# Patient Record
Sex: Male | Born: 1955
Health system: Southern US, Community
[De-identification: ages and names within clinical notes are randomized; demographics above are authoritative.]

## PROBLEM LIST (undated history)

## (undated) DIAGNOSIS — I4891 Unspecified atrial fibrillation: Secondary | ICD-10-CM

## (undated) DIAGNOSIS — R1900 Intra-abdominal and pelvic swelling, mass and lump, unspecified site: Secondary | ICD-10-CM

## (undated) DIAGNOSIS — N183 Chronic kidney disease, stage 3 unspecified: Secondary | ICD-10-CM

## (undated) DIAGNOSIS — Z5189 Encounter for other specified aftercare: Secondary | ICD-10-CM

## (undated) DIAGNOSIS — J189 Pneumonia, unspecified organism: Secondary | ICD-10-CM

## (undated) DIAGNOSIS — M7989 Other specified soft tissue disorders: Secondary | ICD-10-CM

## (undated) DIAGNOSIS — I340 Nonrheumatic mitral (valve) insufficiency: Secondary | ICD-10-CM

## (undated) DIAGNOSIS — D571 Sickle-cell disease without crisis: Secondary | ICD-10-CM

## (undated) DIAGNOSIS — I499 Cardiac arrhythmia, unspecified: Secondary | ICD-10-CM

## (undated) DIAGNOSIS — IMO0001 Reserved for inherently not codable concepts without codable children: Secondary | ICD-10-CM

## (undated) DIAGNOSIS — R6881 Early satiety: Secondary | ICD-10-CM

## (undated) DIAGNOSIS — I071 Rheumatic tricuspid insufficiency: Secondary | ICD-10-CM

## (undated) DIAGNOSIS — M109 Gout, unspecified: Secondary | ICD-10-CM

## (undated) DIAGNOSIS — I272 Pulmonary hypertension, unspecified: Secondary | ICD-10-CM

## (undated) DIAGNOSIS — I509 Heart failure, unspecified: Secondary | ICD-10-CM

## (undated) DIAGNOSIS — K219 Gastro-esophageal reflux disease without esophagitis: Secondary | ICD-10-CM

## (undated) DIAGNOSIS — H548 Legal blindness, as defined in USA: Secondary | ICD-10-CM

## (undated) DIAGNOSIS — N189 Chronic kidney disease, unspecified: Secondary | ICD-10-CM

## (undated) DIAGNOSIS — R51 Headache: Secondary | ICD-10-CM

## (undated) DIAGNOSIS — D759 Disease of blood and blood-forming organs, unspecified: Secondary | ICD-10-CM

## (undated) HISTORY — DX: Chronic kidney disease, unspecified: N18.9

---

## 1997-11-14 ENCOUNTER — Inpatient Hospital Stay (HOSPITAL_COMMUNITY): Admission: EM | Admit: 1997-11-14 | Discharge: 1997-11-17 | Payer: Self-pay | Admitting: Emergency Medicine

## 1998-01-22 ENCOUNTER — Emergency Department (HOSPITAL_COMMUNITY): Admission: EM | Admit: 1998-01-22 | Discharge: 1998-01-22 | Payer: Self-pay | Admitting: Emergency Medicine

## 1999-05-05 ENCOUNTER — Ambulatory Visit (HOSPITAL_COMMUNITY): Admission: RE | Admit: 1999-05-05 | Discharge: 1999-05-05 | Payer: Self-pay | Admitting: Family Medicine

## 1999-05-05 ENCOUNTER — Encounter: Payer: Self-pay | Admitting: Family Medicine

## 1999-05-22 ENCOUNTER — Emergency Department (HOSPITAL_COMMUNITY): Admission: EM | Admit: 1999-05-22 | Discharge: 1999-05-22 | Payer: Self-pay

## 1999-05-25 ENCOUNTER — Encounter: Payer: Self-pay | Admitting: Family Medicine

## 1999-05-25 ENCOUNTER — Ambulatory Visit (HOSPITAL_COMMUNITY): Admission: RE | Admit: 1999-05-25 | Discharge: 1999-05-25 | Payer: Self-pay | Admitting: Family Medicine

## 1999-05-29 ENCOUNTER — Encounter: Admission: RE | Admit: 1999-05-29 | Discharge: 1999-08-27 | Payer: Self-pay | Admitting: Family Medicine

## 2001-03-21 ENCOUNTER — Encounter: Payer: Self-pay | Admitting: Family Medicine

## 2001-03-21 ENCOUNTER — Encounter: Admission: RE | Admit: 2001-03-21 | Discharge: 2001-03-21 | Payer: Self-pay | Admitting: *Deleted

## 2002-04-24 ENCOUNTER — Encounter: Admission: RE | Admit: 2002-04-24 | Discharge: 2002-04-24 | Payer: Self-pay | Admitting: Family Medicine

## 2002-04-24 ENCOUNTER — Encounter: Payer: Self-pay | Admitting: Family Medicine

## 2002-11-17 ENCOUNTER — Encounter: Payer: Self-pay | Admitting: Family Medicine

## 2002-11-17 ENCOUNTER — Inpatient Hospital Stay (HOSPITAL_COMMUNITY): Admission: EM | Admit: 2002-11-17 | Discharge: 2002-11-29 | Payer: Self-pay

## 2002-11-18 ENCOUNTER — Encounter: Payer: Self-pay | Admitting: Family Medicine

## 2002-11-20 ENCOUNTER — Encounter: Payer: Self-pay | Admitting: Family Medicine

## 2002-11-24 ENCOUNTER — Encounter: Payer: Self-pay | Admitting: Family Medicine

## 2003-08-11 ENCOUNTER — Encounter (HOSPITAL_COMMUNITY): Admission: RE | Admit: 2003-08-11 | Discharge: 2003-08-11 | Payer: Self-pay | Admitting: Family Medicine

## 2006-07-05 ENCOUNTER — Emergency Department (HOSPITAL_COMMUNITY): Admission: EM | Admit: 2006-07-05 | Discharge: 2006-07-05 | Payer: Self-pay | Admitting: Emergency Medicine

## 2010-07-05 ENCOUNTER — Emergency Department (HOSPITAL_COMMUNITY)
Admission: EM | Admit: 2010-07-05 | Discharge: 2010-07-05 | Payer: Self-pay | Source: Home / Self Care | Admitting: Emergency Medicine

## 2010-07-06 ENCOUNTER — Ambulatory Visit (HOSPITAL_COMMUNITY)
Admission: RE | Admit: 2010-07-06 | Discharge: 2010-07-06 | Payer: Self-pay | Source: Home / Self Care | Attending: Emergency Medicine | Admitting: Emergency Medicine

## 2010-07-06 ENCOUNTER — Encounter (INDEPENDENT_AMBULATORY_CARE_PROVIDER_SITE_OTHER): Payer: Self-pay | Admitting: Emergency Medicine

## 2010-07-10 LAB — DIFFERENTIAL
Basophils Absolute: 0.3 10*3/uL — ABNORMAL HIGH (ref 0.0–0.1)
Basophils Relative: 2 % — ABNORMAL HIGH (ref 0–1)
Eosinophils Absolute: 0.5 10*3/uL (ref 0.0–0.7)
Eosinophils Relative: 3 % (ref 0–5)
Lymphocytes Relative: 19 % (ref 12–46)
Lymphs Abs: 2.9 10*3/uL (ref 0.7–4.0)
Monocytes Absolute: 2.5 10*3/uL — ABNORMAL HIGH (ref 0.1–1.0)
Monocytes Relative: 16 % — ABNORMAL HIGH (ref 3–12)
Neutro Abs: 9.2 10*3/uL — ABNORMAL HIGH (ref 1.7–7.7)
Neutrophils Relative %: 60 % (ref 43–77)

## 2010-07-10 LAB — URINALYSIS, ROUTINE W REFLEX MICROSCOPIC
Bilirubin Urine: NEGATIVE
Ketones, ur: NEGATIVE mg/dL
Leukocytes, UA: NEGATIVE
Nitrite: NEGATIVE
Protein, ur: >300 mg/dL — AB
Specific Gravity, Urine: 1.01 (ref 1.005–1.030)
Urine Glucose, Fasting: NEGATIVE mg/dL
Urobilinogen, UA: 2 mg/dL — ABNORMAL HIGH (ref 0.0–1.0)
pH: 6.5 (ref 5.0–8.0)

## 2010-07-10 LAB — CBC
HCT: 20.8 % — ABNORMAL LOW (ref 39.0–52.0)
Hemoglobin: 7.6 g/dL — ABNORMAL LOW (ref 13.0–17.0)
MCH: 31.8 pg (ref 26.0–34.0)
MCHC: 36.5 g/dL — ABNORMAL HIGH (ref 30.0–36.0)
MCV: 87 fL (ref 78.0–100.0)
Platelets: 258 10*3/uL (ref 150–400)
RBC: 2.39 MIL/uL — ABNORMAL LOW (ref 4.22–5.81)
RDW: 26.9 % — ABNORMAL HIGH (ref 11.5–15.5)
WBC: 15.4 10*3/uL — ABNORMAL HIGH (ref 4.0–10.5)

## 2010-07-10 LAB — COMPREHENSIVE METABOLIC PANEL
ALT: 24 U/L (ref 0–53)
AST: 72 U/L — ABNORMAL HIGH (ref 0–37)
Albumin: 2.9 g/dL — ABNORMAL LOW (ref 3.5–5.2)
Alkaline Phosphatase: 85 U/L (ref 39–117)
BUN: 17 mg/dL (ref 6–23)
CO2: 22 mEq/L (ref 19–32)
Calcium: 8.4 mg/dL (ref 8.4–10.5)
Chloride: 107 mEq/L (ref 96–112)
Creatinine, Ser: 1.31 mg/dL (ref 0.4–1.5)
GFR calc Af Amer: >60 mL/min (ref >60)
GFR calc non Af Amer: 57 mL/min — ABNORMAL LOW (ref >60)
Glucose, Bld: 103 mg/dL — ABNORMAL HIGH (ref 70–99)
Potassium: 5.1 mEq/L (ref 3.5–5.1)
Sodium: 137 mEq/L (ref 135–145)
Total Bilirubin: 2.8 mg/dL — ABNORMAL HIGH (ref 0.3–1.2)
Total Protein: 6.4 g/dL (ref 6.0–8.3)

## 2010-07-10 LAB — URINE MICROSCOPIC-ADD ON

## 2010-07-10 LAB — RETICULOCYTES
RBC.: 2.25 MIL/uL — ABNORMAL LOW (ref 4.22–5.81)
Retic Count, Absolute: 472.5 10*3/uL — ABNORMAL HIGH (ref 19.0–186.0)
Retic Ct Pct: 21 % — ABNORMAL HIGH (ref 0.4–3.1)

## 2010-11-03 NOTE — Discharge Summary (Signed)
NAMESCOT, ENSOR                       ACCOUNT NO.:  [id number]   MEDICAL RECORD NO.:  [id number]                   PATIENT TYPE:  INP   LOCATION:  5736                                 FACILITY:  [facility]   PHYSICIAN:  Lucianne Lei, M.D.                   DATE OF BIRTH:  [birth year]   DATE OF ADMISSION:  11/17/2002  DATE OF DISCHARGE:  11/29/2002                                 DISCHARGE SUMMARY   HISTORY OF PRESENT ILLNESS:  The patient is a 55 year old male who presented  to the hospital with complaints of pain.  He is known to have sickle cell  disease.  He was admitted to the hospital and given pain medication at the  time.  He continued to have problems with his aeration.  He felt hypoxic.  This patient initially also had a white count of 20.4, hemoglobin of 9.1,  and 25.2.  Reticulocyte count 11.5.  He was placed in the hospital.  He was  started on Tequin, antibiotics.  Hemoglobin dropped down to 7.9 and a white  count of 14.3.  Hematocrit of 22.2.   Because of the decreased problem of his hypoxia, he had a chest spiral CT  done which was negative for any pulmonary emboli being present.  He  continued to have pain and was with O2 saturations down to 81% on five  liters.  It was felt that he probably had acute chest syndrome.  Because of  the periods of prolonged hypoxemia, we did go ahead and get a pulmonary  consultation and also transferred him to the ICU for a short period of time.  Pulmonary concurred with what we were doing and wrote for better pulmonary  toiletry to be done on this patient.  After about three days in the ICU, we  started to see a distinct improvement in his care.   Note that during the period, we did see decrease in his platelet count to  113,000 and that will be monitored on an outpatient.  The patient then  slowly started to show improvement.  He became afebrile.  His O2 saturations  started to improve but he still required O2 at low levels in  order to keep  this level up.  Noticeably, the patient started to diurese and we did see an  improvement.  He was transfused fluids and blood and this also I think  helped with the situation.   He has continued to have low grade temperatures for the next few days and  this too regressed.  We slowly started then decreasing his O2 requirements  until we had him off his O2 in 24 hours and noted that we did not have  desaturations occurring.  At that time, we thought it was fine for this  patient to be sent home.   The patient's pain needs also decreased and he had infiltration of IV.  We  felt that he was fine enough that he could stop his IV pain medications and  he was started on p.o. pain medications.  This appeared to be adequate.  The  real problem, though, was the O2 saturation problems which did take some  time for [place] to correct. The patient is improved and will be sent home in  improved condition with the diagnosis of sickle cell crisis with acute chest  syndrome.   DISCHARGE INSTRUCTIONS:  He will be seen in the office in a one-week period  of time.  He is to continue his Avalox.  He will be instructed in using an  albuterol MDI which he will use on an outpatient basis and he will be going  home on Dilaudid for pain.  Note, that we will repeat his liver enzymes on  an outpatient basis to make sure that they are coming down.  During  dialysis, there was an elevation.                                               Lucianne Lei, M.D.    VB/MEDQ  D:  11/29/2002  T:  11/29/2002  Job:  [id number]

## 2011-05-19 HISTORY — PX: CARDIAC CATHETERIZATION: SHX172

## 2011-06-07 ENCOUNTER — Inpatient Hospital Stay (HOSPITAL_COMMUNITY): Payer: PRIVATE HEALTH INSURANCE

## 2011-06-07 ENCOUNTER — Encounter (HOSPITAL_COMMUNITY): Payer: Self-pay | Admitting: *Deleted

## 2011-06-07 ENCOUNTER — Inpatient Hospital Stay (HOSPITAL_COMMUNITY)
Admission: AD | Admit: 2011-06-07 | Discharge: 2011-06-12 | DRG: 287 | Disposition: A | Discharge status: Home / Self Care | Payer: PRIVATE HEALTH INSURANCE | Source: Ambulatory Visit | Attending: Internal Medicine | Admitting: Internal Medicine

## 2011-06-07 ENCOUNTER — Other Ambulatory Visit: Payer: Self-pay

## 2011-06-07 DIAGNOSIS — K7460 Unspecified cirrhosis of liver: Secondary | ICD-10-CM | POA: Diagnosis present

## 2011-06-07 DIAGNOSIS — D571 Sickle-cell disease without crisis: Secondary | ICD-10-CM | POA: Diagnosis present

## 2011-06-07 DIAGNOSIS — I129 Hypertensive chronic kidney disease with stage 1 through stage 4 chronic kidney disease, or unspecified chronic kidney disease: Secondary | ICD-10-CM | POA: Diagnosis present

## 2011-06-07 DIAGNOSIS — F17200 Nicotine dependence, unspecified, uncomplicated: Secondary | ICD-10-CM | POA: Diagnosis present

## 2011-06-07 DIAGNOSIS — N183 Chronic kidney disease, stage 3 unspecified: Secondary | ICD-10-CM | POA: Diagnosis present

## 2011-06-07 DIAGNOSIS — E8770 Fluid overload, unspecified: Secondary | ICD-10-CM | POA: Diagnosis present

## 2011-06-07 DIAGNOSIS — D689 Coagulation defect, unspecified: Secondary | ICD-10-CM | POA: Diagnosis present

## 2011-06-07 DIAGNOSIS — N179 Acute kidney failure, unspecified: Secondary | ICD-10-CM | POA: Diagnosis present

## 2011-06-07 DIAGNOSIS — J984 Other disorders of lung: Secondary | ICD-10-CM | POA: Diagnosis present

## 2011-06-07 DIAGNOSIS — R601 Generalized edema: Secondary | ICD-10-CM | POA: Diagnosis present

## 2011-06-07 DIAGNOSIS — D638 Anemia in other chronic diseases classified elsewhere: Secondary | ICD-10-CM | POA: Diagnosis present

## 2011-06-07 DIAGNOSIS — I1310 Hypertensive heart and chronic kidney disease without heart failure, with stage 1 through stage 4 chronic kidney disease, or unspecified chronic kidney disease: Secondary | ICD-10-CM | POA: Diagnosis present

## 2011-06-07 DIAGNOSIS — D696 Thrombocytopenia, unspecified: Secondary | ICD-10-CM | POA: Diagnosis present

## 2011-06-07 DIAGNOSIS — I5021 Acute systolic (congestive) heart failure: Principal | ICD-10-CM | POA: Diagnosis present

## 2011-06-07 DIAGNOSIS — I10 Essential (primary) hypertension: Secondary | ICD-10-CM | POA: Diagnosis present

## 2011-06-07 HISTORY — DX: Intra-abdominal and pelvic swelling, mass and lump, unspecified site: R19.00

## 2011-06-07 HISTORY — DX: Other specified soft tissue disorders: M79.89

## 2011-06-07 HISTORY — DX: Headache: R51

## 2011-06-07 LAB — URINALYSIS, ROUTINE W REFLEX MICROSCOPIC
Bilirubin Urine: NEGATIVE
Glucose, UA: NEGATIVE mg/dL
Ketones, ur: NEGATIVE mg/dL
Leukocytes, UA: NEGATIVE
Nitrite: NEGATIVE
Protein, ur: >300 mg/dL — AB
Specific Gravity, Urine: 1.01 (ref 1.005–1.030)
Urobilinogen, UA: 1 mg/dL (ref 0.0–1.0)
pH: 6 (ref 5.0–8.0)

## 2011-06-07 LAB — DIFFERENTIAL
Basophils Absolute: 0.1 10*3/uL (ref 0.0–0.1)
Basophils Relative: 1 % (ref 0–1)
Eosinophils Absolute: 0.1 10*3/uL (ref 0.0–0.7)
Eosinophils Relative: 1 % (ref 0–5)
Lymphocytes Relative: 8 % — ABNORMAL LOW (ref 12–46)
Lymphs Abs: 0.9 10*3/uL (ref 0.7–4.0)
Monocytes Absolute: 1.4 10*3/uL — ABNORMAL HIGH (ref 0.1–1.0)
Monocytes Relative: 13 % — ABNORMAL HIGH (ref 3–12)
Neutro Abs: 8.6 10*3/uL — ABNORMAL HIGH (ref 1.7–7.7)
Neutrophils Relative %: 77 % (ref 43–77)

## 2011-06-07 LAB — CARDIAC PANEL(CRET KIN+CKTOT+MB+TROPI)
CK, MB: 2.9 ng/mL (ref 0.3–4.0)
Relative Index: 2.7 — ABNORMAL HIGH (ref 0.0–2.5)
Total CK: 106 U/L (ref 7–232)
Troponin I: <0.3 ng/mL (ref <0.30)

## 2011-06-07 LAB — CREATININE, URINE, RANDOM: Creatinine, Urine: 35.65 mg/dL

## 2011-06-07 LAB — FOLATE: Folate: 18.6 ng/mL

## 2011-06-07 LAB — PROTIME-INR
INR: 1.33 (ref 0.00–1.49)
Prothrombin Time: 16.7 seconds — ABNORMAL HIGH (ref 11.6–15.2)

## 2011-06-07 LAB — COMPREHENSIVE METABOLIC PANEL
ALT: 19 U/L (ref 0–53)
AST: 54 U/L — ABNORMAL HIGH (ref 0–37)
Albumin: 2.7 g/dL — ABNORMAL LOW (ref 3.5–5.2)
Alkaline Phosphatase: 125 U/L — ABNORMAL HIGH (ref 39–117)
BUN: 29 mg/dL — ABNORMAL HIGH (ref 6–23)
CO2: 17 mEq/L — ABNORMAL LOW (ref 19–32)
Calcium: 7.9 mg/dL — ABNORMAL LOW (ref 8.4–10.5)
Chloride: 107 mEq/L (ref 96–112)
Creatinine, Ser: 1.69 mg/dL — ABNORMAL HIGH (ref 0.50–1.35)
GFR calc Af Amer: 52 mL/min — ABNORMAL LOW (ref >90)
GFR calc non Af Amer: 45 mL/min — ABNORMAL LOW (ref >90)
Glucose, Bld: 91 mg/dL (ref 70–99)
Potassium: 4.9 mEq/L (ref 3.5–5.1)
Sodium: 135 mEq/L (ref 135–145)
Total Bilirubin: 3.8 mg/dL — ABNORMAL HIGH (ref 0.3–1.2)
Total Protein: 6.1 g/dL (ref 6.0–8.3)

## 2011-06-07 LAB — CBC
HCT: 16.8 % — ABNORMAL LOW (ref 39.0–52.0)
Hemoglobin: 6.2 g/dL — CL (ref 13.0–17.0)
MCH: 32.1 pg (ref 26.0–34.0)
MCHC: 36.9 g/dL — ABNORMAL HIGH (ref 30.0–36.0)
MCV: 87 fL (ref 78.0–100.0)
Platelets: 112 10*3/uL — ABNORMAL LOW (ref 150–400)
RBC: 1.93 MIL/uL — ABNORMAL LOW (ref 4.22–5.81)
RDW: 27.5 % — ABNORMAL HIGH (ref 11.5–15.5)
WBC: 11.1 10*3/uL — ABNORMAL HIGH (ref 4.0–10.5)

## 2011-06-07 LAB — URINE MICROSCOPIC-ADD ON

## 2011-06-07 LAB — PRO B NATRIURETIC PEPTIDE: Pro B Natriuretic peptide (BNP): 5641 pg/mL — ABNORMAL HIGH (ref 0–125)

## 2011-06-07 LAB — IRON AND TIBC
Iron: 91 ug/dL (ref 42–135)
Saturation Ratios: 38 % (ref 20–55)
TIBC: 241 ug/dL (ref 215–435)
UIBC: 150 ug/dL (ref 125–400)

## 2011-06-07 LAB — RETICULOCYTES
RBC.: 1.96 MIL/uL — ABNORMAL LOW (ref 4.22–5.81)
Retic Count, Absolute: 348.9 10*3/uL — ABNORMAL HIGH (ref 19.0–186.0)
Retic Ct Pct: 17.8 % — ABNORMAL HIGH (ref 0.4–3.1)

## 2011-06-07 LAB — APTT: aPTT: 34 seconds (ref 24–37)

## 2011-06-07 LAB — VITAMIN B12: Vitamin B-12: 1314 pg/mL — ABNORMAL HIGH (ref 211–911)

## 2011-06-07 LAB — SODIUM, URINE, RANDOM: Sodium, Ur: 86 mEq/L

## 2011-06-07 LAB — FERRITIN: Ferritin: 51 ng/mL (ref 22–322)

## 2011-06-07 LAB — MAGNESIUM: Magnesium: 2.1 mg/dL (ref 1.5–2.5)

## 2011-06-07 LAB — TSH: TSH: 4.901 u[IU]/mL — ABNORMAL HIGH (ref 0.350–4.500)

## 2011-06-07 MED ORDER — ALUM & MAG HYDROXIDE-SIMETH 200-200-20 MG/5ML PO SUSP: 30.0000 mL | Freq: Four times a day (QID) | ORAL | Status: DC | PRN

## 2011-06-07 MED ORDER — ACETAMINOPHEN 650 MG RE SUPP: 650.0000 mg | Freq: Four times a day (QID) | RECTAL | Status: DC | PRN

## 2011-06-07 MED ORDER — SODIUM CHLORIDE 0.9 % IJ SOLN
3.0000 mL | Freq: Two times a day (BID) | INTRAMUSCULAR | Status: DC
  Administered 2011-06-07 – 2011-06-12 (×10): 3 mL via INTRAVENOUS

## 2011-06-07 MED ORDER — ASPIRIN EC 81 MG PO TBEC
81.0000 mg | DELAYED_RELEASE_TABLET | Freq: Every day | ORAL | Status: DC
  Administered 2011-06-07 – 2011-06-12 (×6): 81 mg via ORAL
  Filled 2011-06-07 (×8): qty 1

## 2011-06-07 MED ORDER — SODIUM CHLORIDE 0.9 % IV SOLN: 250.0000 mL | INTRAVENOUS | Status: DC | PRN

## 2011-06-07 MED ORDER — ENOXAPARIN SODIUM 40 MG/0.4ML ~~LOC~~ SOLN
40.0000 mg | SUBCUTANEOUS | Status: DC
  Administered 2011-06-07 – 2011-06-11 (×4): 40 mg via SUBCUTANEOUS
  Filled 2011-06-07 (×7): qty 0.4

## 2011-06-07 MED ORDER — SODIUM CHLORIDE 0.9 % IJ SOLN: 3.0000 mL | INTRAMUSCULAR | Status: DC | PRN

## 2011-06-07 MED ORDER — ACETAMINOPHEN 325 MG PO TABS
650.0000 mg | ORAL_TABLET | Freq: Four times a day (QID) | ORAL | Status: DC | PRN
  Administered 2011-06-09: 650 mg via ORAL
  Filled 2011-06-07: qty 2

## 2011-06-07 MED ORDER — SENNOSIDES-DOCUSATE SODIUM 8.6-50 MG PO TABS: 1.0000 | ORAL_TABLET | Freq: Every evening | ORAL | Status: DC | PRN

## 2011-06-07 MED ORDER — ONDANSETRON HCL 4 MG PO TABS: 4.0000 mg | ORAL_TABLET | Freq: Four times a day (QID) | ORAL | Status: DC | PRN

## 2011-06-07 MED ORDER — NICOTINE 14 MG/24HR TD PT24
14.0000 mg | MEDICATED_PATCH | Freq: Every day | TRANSDERMAL | Status: DC
  Administered 2011-06-07 – 2011-06-12 (×6): 14 mg via TRANSDERMAL
  Filled 2011-06-07 (×8): qty 1

## 2011-06-07 MED ORDER — ONDANSETRON HCL 4 MG/2ML IJ SOLN: 4.0000 mg | Freq: Four times a day (QID) | INTRAMUSCULAR | Status: DC | PRN

## 2011-06-07 MED ORDER — OXYCODONE HCL 5 MG PO TABS
5.0000 mg | ORAL_TABLET | ORAL | Status: DC | PRN
  Administered 2011-06-07 – 2011-06-08 (×2): 5 mg via ORAL
  Filled 2011-06-07 (×3): qty 1

## 2011-06-07 MED ORDER — FUROSEMIDE 10 MG/ML IJ SOLN
60.0000 mg | Freq: Two times a day (BID) | INTRAMUSCULAR | Status: DC
  Administered 2011-06-07: 60 mg via INTRAVENOUS
  Filled 2011-06-07 (×6): qty 6

## 2011-06-07 NOTE — H&P (Signed)
Patrick [name] MRN: [id number] DOB/AGE: 06/26/1955 55 y.o. Primary Care Physician:Patrick J, MD Admit date: 06/07/2011 Chief Complaint: Lower extremity swelling up to abdomen. HPI:  Patrick [name] is a 55 year old African American gentleman with past medical history of sickle cell disease who presents from PCPs office with a 3 month history of worsening lower extremity edema out to his abdomen. Patient states that over the last 3 months he initially started having swelling in his ankle and feet which were intermittent in nature he also had increased urine output and I inability to control his urine due to increased output. Patient states that over the past 3 weeks this lower extremity swelling has worsened swelling has moved from his ankle and feet include his lower extremities as well as his groin he scrotum and his abdomen. Patient endorses some shortness of breath with minimal exertion over the past 3 weeks. Patient also endorses a nonproductive cough over the past few days. Patient denies any chest pain no fever no chills, no nausea, no vomiting, no abdominal pain, no dysuria, no weakness. Patient states has gained approximately 19 pounds over the past 3 months. Patient states his dry weight is 162 pounds. Patient was seen by his PCP and will call for a direct admission.  Past Medical History  Diagnosis Date  . Shortness of breath   . Anemia     sickle cell anemia  . Headache   . Swelling of extremity, right   . Swelling abdomen   . Swelling of extremity, left     History reviewed. No pertinent past surgical history.  Prior to Admission medications   Medication Sig Start Date End Date Taking? Authorizing Provider  Aspirin-Acetaminophen (GOODYS BODY PAIN PO) Take 2 packets by mouth daily as needed. For pain    Yes Historical Provider, MD  ibuprofen (ADVIL,MOTRIN) 200 MG tablet Take 800 mg by mouth daily as needed. For pain    Yes Historical Provider, MD    Allergies: No Known  Allergies  Family History  Problem Relation Age of Onset  . Alcohol abuse Mother   . Liver disease Mother   . Cirrhosis Mother     Social History:  reports that he has been smoking Cigarettes.  He has a 12.5 pack-year smoking history. He has never used smokeless tobacco. He reports that he drinks about 4.2 ounces of alcohol per week. He reports that he does not use illicit drugs.  ROS: All systems reviewed with the patient and was positive as per HPI otherwise all other systems are negative.  PHYSICAL EXAM: Blood pressure 152/90, pulse 97, temperature 97.6 F (36.4 C), temperature source Oral, resp. rate 20, height 5\' 11"  (1.803 m), weight 80.105 kg (176 lb 9.6 oz), SpO2 96.00%. General: Well-developed well-nourished in no acute cardiopulmonary distress.  HEENT: Normocephalic, atraumatic. Pupils equal round and reactive to light and accommodation. Muddy sclera. Extraocular movements intact. Oropharynx is clear, no lesions, no exudates. Neck is supple no lymphadenopathy. Positive JVD. No bruits, no goiter. Heart: Regular rate and rhythm, without murmurs, rubs, gallops. Lungs: Bibasilar crackles. Abdomen: Soft, mild discomfort in the mid abdomen, mildly distended, positive bowel sounds. Extremities: 4+ bilateral lower extremity edema extending all the way to the third of the lower abdomen. Neuro: Alert and oriented x3. Cranial nerves II through XII are grossly intact. Gait not tested secondary to safety.   EKG: None  No results found for this or any previous visit (from the past 240 hour(s)).   Lab results: No results found  for this basename: NA:2,K:2,CL:2,CO2:2,GLUCOSE:2,BUN:2,CREATININE:2,CALCIUM:2,MG:2,PHOS:2 in the last 72 hours No results found for this basename: AST:2,ALT:2,ALKPHOS:2,BILITOT:2,PROT:2,ALBUMIN:2 in the last 72 hours No results found for this basename: LIPASE:2,AMYLASE:2 in the last 72 hours No results found for this basename:  WBC:2,NEUTROABS:2,HGB:2,HCT:2,MCV:2,PLT:2 in the last 72 hours No results found for this basename: CKTOTAL:3,CKMB:3,CKMBINDEX:3,TROPONINI:3 in the last 72 hours No components found with this basename: POCBNP:3 No results found for this basename: DDIMER in the last 72 hours No results found for this basename: HGBA1C:2 in the last 72 hours No results found for this basename: CHOL:2,HDL:2,LDLCALC:2,TRIG:2,CHOLHDL:2,LDLDIRECT:2 in the last 72 hours No results found for this basename: TSH,T4TOTAL,FREET3,T3FREE,THYROIDAB in the last 72 hours No results found for this basename: VITAMINB12:2,FOLATE:2,FERRITIN:2,TIBC:2,IRON:2,RETICCTPCT:2 in the last 72 hours Imaging results:  No results found. Impression/Plan:  Principal Problem:  *Anasarca Active Problems:  Volume overload  HTN (hypertension)  #1 anasarca Differential includes heart failure versus nephrotic syndrome9 patient did have in January of 2011 greater than 300 proteinuria per urinalysis) versus cirrhosis versus hypothyroidism  Versus vena cava syndrome. Admit the patient to telemetry. We'll cycle cardiac enzymes every 8 hours x3, check a fasting lipid panel, check a EKG, check a 2-D echo, check a CBC with differential, check a CMET, check a UA with cultures and sensitivities, check a CT of the abdomen and pelvis, check a 24-hour urine protein, check a TSH, check a chest x-ray. Will place on low-dose aspirin, IV Lasix, strict I/O, daily weights and follow. #2 hypertension Will check a EKG, cycle cardiac enzymes every 8 hours x3, check a UA with cultures and sensitivities, check a 2-D echo. Patient will be on IV Lasix we'll follow for now. #3 prophylaxis Lovenox for DVT prophylaxis. It's been a pleasure taking care of Patrick [name].    Patrick [name] 06/07/2011, 3:49 PM

## 2011-06-07 NOTE — Progress Notes (Signed)
CRITICAL VALUE ALERT  Critical value received:  hemoglobin 6.2   Date of notification:  06/07/11  Time of notification:  4:45 PM   Critical value read back:yes  Nurse who received alert:  Driggers, Allene Pyo     MD notified (1st page):  Dr. Grandville Silos  Time of first page:  4:45 PM   MD notified (2nd page):  Time of second page:  Responding MD:  Dr. Grandville Silos  Time MD responded:  4:50 PM

## 2011-06-08 ENCOUNTER — Inpatient Hospital Stay (HOSPITAL_COMMUNITY): Payer: PRIVATE HEALTH INSURANCE

## 2011-06-08 ENCOUNTER — Encounter (HOSPITAL_COMMUNITY): Payer: Self-pay | Admitting: Radiology

## 2011-06-08 DIAGNOSIS — I059 Rheumatic mitral valve disease, unspecified: Secondary | ICD-10-CM

## 2011-06-08 DIAGNOSIS — I5021 Acute systolic (congestive) heart failure: Principal | ICD-10-CM

## 2011-06-08 LAB — PROTEIN, URINE, 24 HOUR
Collection Interval-UPROT: 24 hours
Protein, 24H Urine: 5280 mg/d — ABNORMAL HIGH (ref 50–100)
Protein, Urine: 120 mg/dL
Urine Total Volume-UPROT: 4400 mL

## 2011-06-08 LAB — BASIC METABOLIC PANEL
BUN: 28 mg/dL — ABNORMAL HIGH (ref 6–23)
CO2: 19 mEq/L (ref 19–32)
Calcium: 8.1 mg/dL — ABNORMAL LOW (ref 8.4–10.5)
Chloride: 108 mEq/L (ref 96–112)
Creatinine, Ser: 1.63 mg/dL — ABNORMAL HIGH (ref 0.50–1.35)
GFR calc Af Amer: 54 mL/min — ABNORMAL LOW (ref >90)
GFR calc non Af Amer: 47 mL/min — ABNORMAL LOW (ref >90)
Glucose, Bld: 88 mg/dL (ref 70–99)
Potassium: 4.5 mEq/L (ref 3.5–5.1)
Sodium: 136 mEq/L (ref 135–145)

## 2011-06-08 LAB — URINE CULTURE
Colony Count: NO GROWTH
Culture  Setup Time: 201212202110
Culture: NO GROWTH

## 2011-06-08 LAB — CBC
HCT: 15.3 % — ABNORMAL LOW (ref 39.0–52.0)
Hemoglobin: 5.6 g/dL — CL (ref 13.0–17.0)
MCH: 31.6 pg (ref 26.0–34.0)
MCHC: 36.6 g/dL — ABNORMAL HIGH (ref 30.0–36.0)
MCV: 86.4 fL (ref 78.0–100.0)
Platelets: 99 10*3/uL — ABNORMAL LOW (ref 150–400)
RBC: 1.77 MIL/uL — ABNORMAL LOW (ref 4.22–5.81)
RDW: 27.2 % — ABNORMAL HIGH (ref 11.5–15.5)
WBC: 11.3 10*3/uL — ABNORMAL HIGH (ref 4.0–10.5)

## 2011-06-08 LAB — PRO B NATRIURETIC PEPTIDE: Pro B Natriuretic peptide (BNP): 5459 pg/mL — ABNORMAL HIGH (ref 0–125)

## 2011-06-08 LAB — CARDIAC PANEL(CRET KIN+CKTOT+MB+TROPI)
CK, MB: 2.5 ng/mL (ref 0.3–4.0)
Relative Index: INVALID (ref 0.0–2.5)
Total CK: 79 U/L (ref 7–232)
Troponin I: <0.3 ng/mL (ref <0.30)

## 2011-06-08 LAB — HEPATITIS C ANTIBODY: HCV Ab: NEGATIVE

## 2011-06-08 LAB — ABO/RH: ABO/RH(D): AB POS

## 2011-06-08 LAB — PROTEIN, URINE, RANDOM: Total Protein, Urine: 135.8 mg/dL

## 2011-06-08 LAB — PREPARE RBC (CROSSMATCH)

## 2011-06-08 LAB — HEPATITIS B SURFACE ANTIGEN: Hepatitis B Surface Ag: NEGATIVE

## 2011-06-08 LAB — HEMOGLOBIN A1C: Hgb A1c MFr Bld: <4 % (ref <5.7)

## 2011-06-08 LAB — T3, FREE: T3, Free: 1.8 pg/mL — ABNORMAL LOW (ref 2.3–4.2)

## 2011-06-08 LAB — MAGNESIUM: Magnesium: 1.9 mg/dL (ref 1.5–2.5)

## 2011-06-08 MED ORDER — LISINOPRIL 10 MG PO TABS
10.0000 mg | ORAL_TABLET | Freq: Every day | ORAL | Status: DC
  Administered 2011-06-08 – 2011-06-09 (×2): 10 mg via ORAL
  Filled 2011-06-08 (×2): qty 1

## 2011-06-08 MED ORDER — DIGOXIN 125 MCG PO TABS
0.1250 mg | ORAL_TABLET | Freq: Every day | ORAL | Status: DC
  Administered 2011-06-08 – 2011-06-12 (×5): 0.125 mg via ORAL
  Filled 2011-06-08 (×5): qty 1

## 2011-06-08 MED ORDER — IOHEXOL 300 MG/ML  SOLN
100.0000 mL | Freq: Once | INTRAMUSCULAR | Status: AC | PRN
  Administered 2011-06-08: 100 mL via INTRAVENOUS

## 2011-06-08 MED ORDER — FUROSEMIDE 10 MG/ML IJ SOLN
60.0000 mg | Freq: Three times a day (TID) | INTRAMUSCULAR | Status: DC
  Administered 2011-06-08 – 2011-06-11 (×9): 60 mg via INTRAVENOUS
  Filled 2011-06-08 (×12): qty 6

## 2011-06-08 MED ORDER — IOHEXOL 300 MG/ML  SOLN
40.0000 mL | Freq: Once | INTRAMUSCULAR | Status: AC | PRN
  Administered 2011-06-08: 40 mL via ORAL

## 2011-06-08 MED ORDER — OXYCODONE HCL 5 MG PO TABS
5.0000 mg | ORAL_TABLET | Freq: Once | ORAL | Status: AC
  Administered 2011-06-08: 5 mg via ORAL

## 2011-06-08 NOTE — Progress Notes (Signed)
CRITICAL VALUE ALERT  Critical value received:  Hgb 5.6  Date of notification:  06/08/2011  Time of notification:  7:15 AM   Critical value read back:yes  Nurse who received alert:  Nachum Lemmings, RN   MD notified (1st page):  Dr. Broadus John  Time of first page:  7:20 AM   MD notified (2nd page):  Time of second page:  Responding MD:  Dr. Broadus John  Time MD responded:  7:23 AM

## 2011-06-08 NOTE — Consult Note (Addendum)
Referring Physician: [facility] Primary Physician: Criss Rosales Primary Cardiologist: None Reason for Consultation: New onset HF   HPI:  Abdalrahman Evilsizor is a 55 year old African American gentleman with past medical history of sickle cell disease who was admitted from his PCPs office with a 3 month history of worsening lower extremity edema and abdominal distension. Patient states that over the last 3 months he initially started having swelling in his ankle and feet which were intermittent in nature he also had increased urine output and I inability to control his urine due to increased output. Patient states that over the past 3 weeks this lower extremity swelling has worsened swelling has moved from his ankle and feet include his lower extremities as well as his groin he scrotum and his abdomen. Patient endorses some shortness of breath with minimal exertion over the past 3 weeks. Patient also endorses a nonproductive cough over the past few days. Has gained 19 pounds.  Patient denies any h/o known heart failure or CAD. No chest pain no fever no chills, no nausea, no vomiting, no abdominal pain, no dysuria, no weakness. Denise HTN.  On admit Hgb 6. Now down to 5.6. Echo done with EF read as 35-40%. I have reviewed echo and EF 30-35% range (gloabl HK). RV normal function. Mod LAE.   UA with > 300 protein now undergoing 24 hour urine.  Review of Systems:     Cardiac Review of Systems: {Y] = yes [ ]  = no  Chest Pain [    ]  Resting SOB [  Y ] Exertional SOB  [ y ]  Orthopnea [ y ]   Pedal Edema Blue.Reese   ]    Palpitations [  ] Syncope  [  ]   Presyncope [   ]  General Review of Systems: [Y] = yes [  ]=no Constitional: recent weight change [  ]; anorexia [  ]; fatigue [ y ]; nausea [  ]; night sweats [  ]; fever [  ]; or chills [  ];                     Eye : blurred vision [  ]; diplopia [   ]; vision changes [  ];  Amaurosis fugax[  ]; Resp: cough [ y ];  wheezing[  ];  hemoptysis[  ]; shortness of  breath[ y ]; paroxysmal nocturnal dyspnea[ y ]; dyspnea on exertion[y  ]; or orthopnea[  y];  GI:  gallstones[  ], vomiting[  ];  dysphagia[  ]; melena[  ];  hematochezia [  ]; heartburn[  ];    GU: kidney stones [  ]; hematuria[  ];   dysuria [  ];  nocturia[  ];  history of     obstruction [  ];                 Skin: rash, swelling[  ];, hair loss[  ];  peripheral edema[  ];  or itching[  ]; Musculosketetal: myalgias[  ];  joint swelling[  ];  joint erythema[  ];  joint pain[y  ];  back pain[  ];  Heme/Lymph: bruising[  ];  bleeding[  ];  anemia[ y ];  Neuro: TIA[  ];  headaches[  ];  stroke[  ];  vertigo[  ];  seizures[  ];   paresthesias[  ];  difficulty walking[  ];  Psych:depression[  ]; anxiety[  ];  Endocrine: diabetes[  ];  thyroid dysfunction[  ];  Immunizations: Flu [  ]; Pneumococcal[  ];  Other:  Past Medical History  Diagnosis Date  . Shortness of breath   . Anemia     sickle cell anemia  . Headache   . Swelling of extremity, right   . Swelling abdomen   . Swelling of extremity, left     Medications Prior to Admission  Medication Dose Route Frequency Provider Last Rate Last Dose  . 0.9 %  sodium chloride infusion  250 mL Intravenous PRN Irine Seal      . acetaminophen (TYLENOL) tablet 650 mg  650 mg Oral Q6H PRN Irine Seal       Or  . acetaminophen (TYLENOL) suppository 650 mg  650 mg Rectal Q6H PRN Irine Seal      . alum & mag hydroxide-simeth (MAALOX/MYLANTA) 200-200-20 MG/5ML suspension 30 mL  30 mL Oral Q6H PRN Irine Seal      . aspirin EC tablet 81 mg  81 mg Oral Daily Mikelle Myrick Thompson   81 mg at 06/08/11 0954  . enoxaparin (LOVENOX) injection 40 mg  40 mg Subcutaneous Q24H Javarion Douty Thompson   40 mg at 06/07/11 2303  . furosemide (LASIX) injection 60 mg  60 mg Intravenous Q12H Adeline Petitfrere Thompson   60 mg at 06/07/11 1817  . iohexol (OMNIPAQUE) 300 MG/ML solution 100 mL  100 mL Intravenous Once PRN Medication Radiologist   100 mL at 06/08/11 0140  .  iohexol (OMNIPAQUE) 300 MG/ML solution 40 mL  40 mL Oral Once PRN Medication Radiologist   40 mL at 06/08/11 0140  . nicotine (NICODERM CQ - dosed in mg/24 hours) patch 14 mg  14 mg Transdermal Daily Edona Schreffler Thompson   14 mg at 06/08/11 0950  . ondansetron (ZOFRAN) tablet 4 mg  4 mg Oral Q6H PRN Irine Seal       Or  . ondansetron [facility]) injection 4 mg  4 mg Intravenous Q6H PRN Irine Seal      . oxyCODONE (Oxy IR/ROXICODONE) immediate release tablet 5 mg  5 mg Oral Q4H PRN Irine Seal   5 mg at 06/07/11 1855  . senna-docusate (Senokot-S) tablet 1 tablet  1 tablet Oral QHS PRN Irine Seal      . sodium chloride 0.9 % injection 3 mL  3 mL Intravenous Q12H Gweneth Fredlund Thompson   3 mL at 06/08/11 0950  . sodium chloride 0.9 % injection 3 mL  3 mL Intravenous PRN Irine Seal       No current outpatient prescriptions on file as of 06/08/2011.        Marland Kitchen aspirin EC  81 mg Oral Daily  . enoxaparin  40 mg Subcutaneous Q24H  . furosemide  60 mg Intravenous Q12H  . nicotine  14 mg Transdermal Daily  . sodium chloride  3 mL Intravenous Q12H    Infusions:    No Known Allergies  History   Social History  . Marital Status: Divorced    Spouse Name: N/A    Number of Children: N/A  . Years of Education: N/A   Occupational History  . Not on file.   Social History Main Topics  . Smoking status: Current Everyday Smoker -- 0.5 packs/day for 25 years    Types: Cigarettes  . Smokeless tobacco: Never Used  . Alcohol Use: 4.2 oz/week    7 Cans of beer per week  . Drug Use: No  . Sexually Active:    Other Topics Concern  . Not on file   Social  History Narrative  . No narrative on file    Family History  Problem Relation Age of Onset  . Alcohol abuse Mother   . Liver disease Mother   . Cirrhosis Mother     PHYSICAL EXAM: Filed Vitals:   06/08/11 1405  BP: 133/75  Pulse: 83  Temp: 98.1 F (36.7 C)  Resp: 20     Intake/Output Summary (Last 24 hours) at  06/08/11 1556 Last data filed at 06/08/11 1405  Gross per 24 hour  Intake  385.5 ml  Output   1530 ml  Net -1144.5 ml    General:  Looks younger than stated age. No respiratory difficulty HEENT: normal Neck: supple. JVP to ear Carotids 2+ bilat; no bruits. No lymphadenopathy or thryomegaly appreciated. Cor: PMI laterally displaced. Regular. Tachy. +s3 3/6 TR Lungs: basilar crackles Abdomen: soft, + distended. Liver edge down and pulsatile. No bruits or masses. Good bowel sounds. Extremities: no cyanosis, + clubbing, no rash, 3+ edema + scrotal edema Neuro: alert & oriented x 3, cranial nerves grossly intact. moves all 4 extremities w/o difficulty. Affect pleasant.  ECG: Sinus 83 No ST-T wave abnormalities.    Results for orders placed during the hospital encounter of 06/07/11 (from the past 24 hour(s))  CBC     Status: Abnormal   Collection Time   06/07/11  4:03 PM      Component Value Range   WBC 11.1 (*) 4.0 - 10.5 (K/uL)   RBC 1.93 (*) 4.22 - 5.81 (MIL/uL)   Hemoglobin 6.2 (*) 13.0 - 17.0 (g/dL)   HCT 16.8 (*) 39.0 - 52.0 (%)   MCV 87.0  78.0 - 100.0 (fL)   MCH 32.1  26.0 - 34.0 (pg)   MCHC 36.9 (*) 30.0 - 36.0 (g/dL)   RDW 27.5 (*) 11.5 - 15.5 (%)   Platelets 112 (*) 150 - 400 (K/uL)  COMPREHENSIVE METABOLIC PANEL     Status: Abnormal   Collection Time   06/07/11  4:03 PM      Component Value Range   Sodium 135  135 - 145 (mEq/L)   Potassium 4.9  3.5 - 5.1 (mEq/L)   Chloride 107  96 - 112 (mEq/L)   CO2 17 (*) 19 - 32 (mEq/L)   Glucose, Bld 91  70 - 99 (mg/dL)   BUN 29 (*) 6 - 23 (mg/dL)   Creatinine, Ser 1.69 (*) 0.50 - 1.35 (mg/dL)   Calcium 7.9 (*) 8.4 - 10.5 (mg/dL)   Total Protein 6.1  6.0 - 8.3 (g/dL)   Albumin 2.7 (*) 3.5 - 5.2 (g/dL)   AST 54 (*) 0 - 37 (U/L)   ALT 19  0 - 53 (U/L)   Alkaline Phosphatase 125 (*) 39 - 117 (U/L)   Total Bilirubin 3.8 (*) 0.3 - 1.2 (mg/dL)   GFR calc non Af Amer 45 (*) >90 (mL/min)   GFR calc Af Amer 52 (*) >90 (mL/min)    MAGNESIUM     Status: Normal   Collection Time   06/07/11  4:03 PM      Component Value Range   Magnesium 2.1  1.5 - 2.5 (mg/dL)  DIFFERENTIAL     Status: Abnormal   Collection Time   06/07/11  4:03 PM      Component Value Range   Neutrophils Relative 77  43 - 77 (%)   Lymphocytes Relative 8 (*) 12 - 46 (%)   Monocytes Relative 13 (*) 3 - 12 (%)   Eosinophils Relative 1  0 - 5 (%)   Basophils Relative 1  0 - 1 (%)   Neutro Abs 8.6 (*) 1.7 - 7.7 (K/uL)   Lymphs Abs 0.9  0.7 - 4.0 (K/uL)   Monocytes Absolute 1.4 (*) 0.1 - 1.0 (K/uL)   Eosinophils Absolute 0.1  0.0 - 0.7 (K/uL)   Basophils Absolute 0.1  0.0 - 0.1 (K/uL)   RBC Morphology TARGET CELLS    TSH     Status: Abnormal   Collection Time   06/07/11  4:03 PM      Component Value Range   TSH 4.901 (*) 0.350 - 4.500 (uIU/mL)  PROTIME-INR     Status: Abnormal   Collection Time   06/07/11  4:03 PM      Component Value Range   Prothrombin Time 16.7 (*) 11.6 - 15.2 (seconds)   INR 1.33  0.00 - 1.49   APTT     Status: Normal   Collection Time   06/07/11  4:03 PM      Component Value Range   aPTT 34  24 - 37 (seconds)  HEMOGLOBIN A1C     Status: Normal   Collection Time   06/07/11  4:03 PM      Component Value Range   Hemoglobin A1C <4.0  <5.7 (%)   Mean Plasma Glucose NOT CALCULATED  <117 (mg/dL)  PRO B NATRIURETIC PEPTIDE     Status: Abnormal   Collection Time   06/07/11  4:04 PM      Component Value Range   Pro B Natriuretic peptide (BNP) 5641.0 (*) 0 - 125 (pg/mL)  CARDIAC PANEL(CRET KIN+CKTOT+MB+TROPI)     Status: Abnormal   Collection Time   06/07/11  4:04 PM      Component Value Range   Total CK 106  7 - 232 (U/L)   CK, MB 2.9  0.3 - 4.0 (ng/mL)   Troponin I <0.30  <0.30 (ng/mL)   Relative Index 2.7 (*) 0.0 - 2.5   VITAMIN B12     Status: Abnormal   Collection Time   06/07/11  5:20 PM      Component Value Range   Vitamin B-12 1314 (*) 211 - 911 (pg/mL)  FOLATE     Status: Normal   Collection Time    06/07/11  5:20 PM      Component Value Range   Folate 18.6    IRON AND TIBC     Status: Normal   Collection Time   06/07/11  5:20 PM      Component Value Range   Iron 91  42 - 135 (ug/dL)   TIBC 241  215 - 435 (ug/dL)   Saturation Ratios 38  20 - 55 (%)   UIBC 150  125 - 400 (ug/dL)  FERRITIN     Status: Normal   Collection Time   06/07/11  5:20 PM      Component Value Range   Ferritin 51  22 - 322 (ng/mL)  RETICULOCYTES     Status: Abnormal   Collection Time   06/07/11  5:20 PM      Component Value Range   Retic Ct Pct 17.8 (*) 0.4 - 3.1 (%)   RBC. 1.96 (*) 4.22 - 5.81 (MIL/uL)   Retic Count, Manual 348.9 (*) 19.0 - 186.0 (K/uL)  URINALYSIS, ROUTINE W REFLEX MICROSCOPIC     Status: Abnormal   Collection Time   06/07/11  6:58 PM      Component Value Range   Color,  Urine YELLOW  YELLOW    APPearance CLEAR  CLEAR    Specific Gravity, Urine 1.010  1.005 - 1.030    pH 6.0  5.0 - 8.0    Glucose, UA NEGATIVE  NEGATIVE (mg/dL)   Hgb urine dipstick LARGE (*) NEGATIVE    Bilirubin Urine NEGATIVE  NEGATIVE    Ketones, ur NEGATIVE  NEGATIVE (mg/dL)   Protein, ur >300 (*) NEGATIVE (mg/dL)   Urobilinogen, UA 1.0  0.0 - 1.0 (mg/dL)   Nitrite NEGATIVE  NEGATIVE    Leukocytes, UA NEGATIVE  NEGATIVE   SODIUM, URINE, RANDOM     Status: Normal   Collection Time   06/07/11  6:58 PM      Component Value Range   Sodium, Ur 86    CREATININE, URINE, RANDOM     Status: Normal   Collection Time   06/07/11  6:58 PM      Component Value Range   Creatinine, Urine 35.65    URINE MICROSCOPIC-ADD ON     Status: Normal   Collection Time   06/07/11  6:58 PM      Component Value Range   Squamous Epithelial / LPF RARE  RARE    WBC, UA 0-2  <3 (WBC/hpf)   RBC / HPF 0-2  <3 (RBC/hpf)   Urine-Other AMORPHOUS URATES/PHOSPHATES    CARDIAC PANEL(CRET KIN+CKTOT+MB+TROPI)     Status: Normal   Collection Time   06/07/11 11:31 PM      Component Value Range   Total CK 79  7 - 232 (U/L)   CK, MB 2.5   0.3 - 4.0 (ng/mL)   Troponin I <0.30  <0.30 (ng/mL)   Relative Index RELATIVE INDEX IS INVALID  0.0 - 2.5   BASIC METABOLIC PANEL     Status: Abnormal   Collection Time   06/08/11  6:25 AM      Component Value Range   Sodium 136  135 - 145 (mEq/L)   Potassium 4.5  3.5 - 5.1 (mEq/L)   Chloride 108  96 - 112 (mEq/L)   CO2 19  19 - 32 (mEq/L)   Glucose, Bld 88  70 - 99 (mg/dL)   BUN 28 (*) 6 - 23 (mg/dL)   Creatinine, Ser 1.63 (*) 0.50 - 1.35 (mg/dL)   Calcium 8.1 (*) 8.4 - 10.5 (mg/dL)   GFR calc non Af Amer 47 (*) >90 (mL/min)   GFR calc Af Amer 54 (*) >90 (mL/min)  CBC     Status: Abnormal   Collection Time   06/08/11  6:25 AM      Component Value Range   WBC 11.3 (*) 4.0 - 10.5 (K/uL)   RBC 1.77 (*) 4.22 - 5.81 (MIL/uL)   Hemoglobin 5.6 (*) 13.0 - 17.0 (g/dL)   HCT 15.3 (*) 39.0 - 52.0 (%)   MCV 86.4  78.0 - 100.0 (fL)   MCH 31.6  26.0 - 34.0 (pg)   MCHC 36.6 (*) 30.0 - 36.0 (g/dL)   RDW 27.2 (*) 11.5 - 15.5 (%)   Platelets 99 (*) 150 - 400 (K/uL)  PRO B NATRIURETIC PEPTIDE     Status: Abnormal   Collection Time   06/08/11  6:25 AM      Component Value Range   Pro B Natriuretic peptide (BNP) 5459.0 (*) 0 - 125 (pg/mL)  MAGNESIUM     Status: Normal   Collection Time   06/08/11  6:25 AM      Component Value Range   Magnesium  1.9  1.5 - 2.5 (mg/dL)  PREPARE RBC (CROSSMATCH)     Status: Normal   Collection Time   06/08/11 11:00 AM      Component Value Range   Order Confirmation ORDER PROCESSED BY BLOOD BANK    TYPE AND SCREEN     Status: Normal (Preliminary result)   Collection Time   06/08/11 11:00 AM      Component Value Range   ABO/RH(D) AB POS     Antibody Screen NEG     Sample Expiration 06/11/2011     PT AG Type       Value: NEGATIVE FOR C ANTIGEN NEGATIVE FOR E ANTIGEN NEGATIVE FOR KELL ANTIGEN POSITIVE FOR e ANTIGEN POSITIVE FOR c ANTIGEN NEGATIVE FOR DUFFY A ANTIGEN NEGATIVE FOR DUFFY B ANTIGEN NEGATIVE FOR S ANTIGEN POSITIVE FOR s ANTIGEN POSITIVE FOR KIDD  A ANTIGEN      NEGATIVE FOR KIDD B ANTIGEN   Unit Number [id number]     Blood Component Type RED CELLS,LR     Unit division 00     Status of Unit ALLOCATED     Donor AG Type       Value: NEGATIVE FOR C ANTIGEN NEGATIVE FOR E ANTIGEN NEGATIVE FOR KELL ANTIGEN   Transfusion Status OK TO TRANSFUSE     Crossmatch Result Compatible     Unit Number [id number]     Blood Component Type RED CELLS,LR     Unit division 00     Status of Unit ISSUED     Donor AG Type       Value: NEGATIVE FOR C ANTIGEN NEGATIVE FOR E ANTIGEN NEGATIVE FOR KELL ANTIGEN   Transfusion Status OK TO TRANSFUSE     Crossmatch Result Compatible     Unit Number [id number]     Blood Component Type RED CELLS,LR     Unit division 00     Status of Unit ALLOCATED     Transfusion Status OK TO TRANSFUSE     Crossmatch Result Compatible    ABO/RH     Status: Normal   Collection Time   06/08/11 11:00 AM      Component Value Range   ABO/RH(D) AB POS     X-ray Chest Pa And Lateral   06/07/2011  *RADIOLOGY REPORT*  Clinical Data: Shortness of breath.  CHEST - 2 VIEW  Comparison: No comparison exams available for direct review. Report from 11/24/2002 is available.  Findings: Cardiomegaly.  Pulmonary vascular prominence / congestion most notable centrally.  No segmental infiltrate or pneumothorax.  Mildly tortuous aorta.  IMPRESSION: Cardiomegaly.  Pulmonary vascular prominence / congestion most notable centrally.  Original Report Authenticated By: Doug Sou, M.D.   Ct Abdomen Pelvis W Contrast  06/08/2011  *RADIOLOGY REPORT*  Clinical Data: Anasarca, evaluate for venous obstruction.  CT ABDOMEN AND PELVIS WITH CONTRAST  Technique:  Multidetector CT imaging of the abdomen and pelvis was performed following the standard protocol during bolus administration of intravenous contrast.  Contrast: 28mL OMNIPAQUE IOHEXOL 300 MG/ML IV SOLN, 169mL OMNIPAQUE IOHEXOL 300 MG/ML IV SOLN  Comparison: None  Findings:  Cardiomegaly.  Bibasilar  opacities.  Trace pleural effusions.  Lobular liver contour.  Cholelithiasis.  Auto infarcted spleen. Unremarkable pancreas.  Adrenal gland hyperplasia without focal abnormality.  Symmetric renal enhancement.  No hydronephrosis or hydroureter. There are a couple too small to further characterize hypodensities within the upper pole of the left kidney.  No bowel obstruction.  No CT evidence for colitis.  Normal appendix.  A small amount of ascites.  There is scattered atherosclerotic calcification of the aorta and its branches. No aneurysmal dilatation.  No lymphadenopathy.  No definitive evidence for thrombus within the IVC or iliac vessels.  Diffuse anasarca.  Multilevel sclerosis without acute osseous abnormality.  IMPRESSION: Cardiomegaly.  Trace pericardial effusion with associate opacities; atelectasis versus pneumonia.  No filling defect identified within the veins of the abdomen pelvis.  Lobular liver contour, can be seen with early cirrhotic change.  Autosplenectomy.  Multilevel osteosclerosis without definite acute osseous abnormality.  Cholelithiasis.  Original Report Authenticated By: Suanne Marker, M.D.     ASSESSMENT:  1) Acute systolic HF with marked volume overload 2) Sickle cell anemia 3) Acute respiratory distress, improved 4) Renal failure, stage 3 5) Tobacco use 6) Moderate ETOH use  PLAN/DISCUSSION:  Mr. Ryce has severe HF with marked volume overload and probable cardiorenal syndrome. I am unsure what is the cause of his cardiomyopathy. Given his h/o sickle cell, my initial concern was for PAH/right heart failure but his RV is normal on echo. I discussed his ETOH consumption with him at length but he says he only drinks in moderation. He denies h/o severe HTN. At this point with focus on diuresis and afterload reduction. May need cath at some point. Agree with transfusion and 24-hour urine. Increase lasix to 60 TID. Add digoxin and ace-I. B-blocker to start soon. We will  follow. MD time spent 50 mins.  Shylynn Bruning,MD 4:15 PM

## 2011-06-08 NOTE — Progress Notes (Addendum)
Subjective: Swelling in feet extending to scrotum, feels weak  Objective: Vital signs in last 24 hours: Temp:  [97.6 F (36.4 C)-98.1 F (36.7 C)] 98 F (36.7 C) (12/21 0345) Pulse Rate:  [89-97] 89  (12/21 0345) Resp:  [20] 20  (12/21 0345) BP: (123-152)/(72-90) 127/73 mmHg (12/21 0345) SpO2:  [88 %-96 %] 88 % (12/21 0345) Weight:  [80.105 kg (176 lb 9.6 oz)] 176 lb 9.6 oz (80.105 kg) (12/20 1427) Weight change:  Last BM Date: 06/07/11  Intake/Output from previous day: 12/20 0701 - 12/21 0700 In: -  Out: 980 [Urine:980] Total I/O In: -  Out: 550 [Urine:550]   Physical Exam: General: Alert, awake, oriented x3, in no acute distress. HEENT: No bruits, no goiter. Heart: Regular rate and rhythm, without murmurs, rubs, gallops. Lungs: Clear to auscultation bilaterally. Abdomen: Soft, nontender, nondistended, positive bowel sounds. Extremities: 4 plus edema, No clubbing cyanosis  with positive pedal pulses. Neuro: Grossly intact, nonfocal. Skin: no stigmata of liver dz  Lab Results: Basic Metabolic Panel:  Basename 06/08/11 0625 06/07/11 1603  NA 136 135  K 4.5 4.9  CL 108 107  CO2 19 17*  GLUCOSE 88 91  BUN 28* 29*  CREATININE 1.63* 1.69*  CALCIUM 8.1* 7.9*  MG 1.9 2.1  PHOS -- --   Liver Function Tests:  Basename 06/07/11 1603  AST 54*  ALT 19  ALKPHOS 125*  BILITOT 3.8*  PROT 6.1  ALBUMIN 2.7*   No results found for this basename: LIPASE:2,AMYLASE:2 in the last 72 hours No results found for this basename: AMMONIA:2 in the last 72 hours CBC:  Basename 06/08/11 0625 06/07/11 1603  WBC 11.3* 11.1*  NEUTROABS -- 8.6*  HGB 5.6* 6.2*  HCT 15.3* 16.8*  MCV 86.4 87.0  PLT 99* 112*   Cardiac Enzymes:  Basename 06/07/11 2331 06/07/11 1604  CKTOTAL 79 106  CKMB 2.5 2.9  CKMBINDEX -- --  TROPONINI <0.30 <0.30   BNP: No components found with this basename: POCBNP:3 D-Dimer: No results found for this basename: DDIMER:2 in the last 72 hours CBG: No  results found for this basename: GLUCAP:6 in the last 72 hours Hemoglobin A1C: No results found for this basename: HGBA1C in the last 72 hours Fasting Lipid Panel: No results found for this basename: CHOL,HDL,LDLCALC,TRIG,CHOLHDL,LDLDIRECT in the last 72 hours Thyroid Function Tests:  Basename 06/07/11 1603  TSH 4.901*  T4TOTAL --  FREET4 --  T3FREE --  THYROIDAB --   Anemia Panel:  Basename 06/07/11 1720  VITAMINB12 1314*  FOLATE 18.6  FERRITIN 51  TIBC 241  IRON 91  RETICCTPCT 17.8*   Coagulation:  Basename 06/07/11 1603  LABPROT 16.7*  INR 1.33   Urine Drug Screen: Drugs of Abuse  No results found for this basename: labopia, cocainscrnur, labbenz, amphetmu, thcu, labbarb    Alcohol Level: No results found for this basename: ETH:2 in the last 72 hours  No results found for this or any previous visit (from the past 240 hour(s)).  Studies/Results: X-ray Chest Pa And Lateral   06/07/2011  *RADIOLOGY REPORT*  Clinical Data: Shortness of breath.  CHEST - 2 VIEW  Comparison: No comparison exams available for direct review. Report from 11/24/2002 is available.  Findings: Cardiomegaly.  Pulmonary vascular prominence / congestion most notable centrally.  No segmental infiltrate or pneumothorax.  Mildly tortuous aorta.  IMPRESSION: Cardiomegaly.  Pulmonary vascular prominence / congestion most notable centrally.  Original Report Authenticated By: Doug Sou, M.D.   Ct Abdomen Pelvis W Contrast  06/08/2011  *RADIOLOGY REPORT*  Clinical Data: Anasarca, evaluate for venous obstruction.  CT ABDOMEN AND PELVIS WITH CONTRAST  Technique:  Multidetector CT imaging of the abdomen and pelvis was performed following the standard protocol during bolus administration of intravenous contrast.  Contrast: 52mL OMNIPAQUE IOHEXOL 300 MG/ML IV SOLN, 166mL OMNIPAQUE IOHEXOL 300 MG/ML IV SOLN  Comparison: None  Findings:  Cardiomegaly.  Bibasilar opacities.  Trace pleural effusions.  Lobular  liver contour.  Cholelithiasis.  Auto infarcted spleen. Unremarkable pancreas.  Adrenal gland hyperplasia without focal abnormality.  Symmetric renal enhancement.  No hydronephrosis or hydroureter. There are a couple too small to further characterize hypodensities within the upper pole of the left kidney.  No bowel obstruction.  No CT evidence for colitis.  Normal appendix.  A small amount of ascites.  There is scattered atherosclerotic calcification of the aorta and its branches. No aneurysmal dilatation.  No lymphadenopathy.  No definitive evidence for thrombus within the IVC or iliac vessels.  Diffuse anasarca.  Multilevel sclerosis without acute osseous abnormality.  IMPRESSION: Cardiomegaly.  Trace pericardial effusion with associate opacities; atelectasis versus pneumonia.  No filling defect identified within the veins of the abdomen pelvis.  Lobular liver contour, can be seen with early cirrhotic change.  Autosplenectomy.  Multilevel osteosclerosis without definite acute osseous abnormality.  Cholelithiasis.  Original Report Authenticated By: Suanne Marker, M.D.    Medications: Scheduled Meds:   . aspirin EC  81 mg Oral Daily  . enoxaparin  40 mg Subcutaneous Q24H  . furosemide  60 mg Intravenous Q12H  . nicotine  14 mg Transdermal Daily  . sodium chloride  3 mL Intravenous Q12H   Continuous Infusions:  PRN Meds:.sodium chloride, acetaminophen, acetaminophen, alum & mag hydroxide-simeth, iohexol, iohexol, ondansetron (ZOFRAN) IV, ondansetron, oxyCODONE, senna-docusate, sodium chloride  Assessment/Plan: 1. Anasarca 2. Volume Overload 3. Anemia of chronic disease 4. Renal insufficiency 5. Mild coagulopathy 6. Thrombocytopenia 7. H/o sickle cell disease  Etiology of anasarca unclear, i'm leaning towards cirrhosis based on nodular liver on CT scan and coagulopathy, low albumin, thrombocytopenia Will check hepatitis serology, not a heavy drinker, ferritin is normal suggest against  hemochromatosis, Check echo for cardiac etiology Check Alpha 1 antitrypsin level, check ASMA for autoimmune hepatitis, Primary biliary cirrhosis unlikely Will request GI consult In terms of his anemia: is suspect its secondary to chronic disease: check anemia panel, hemoccult stool, SPEP, UPEP Nephrotic syndrome is also a possibility, will check spot urine protein, SPEP, UPEP Continue lasix IV, transfuse 2 units PRBC      LOS: 1 day   Sorren Vallier [facility] Pager: [phone] 06/08/2011, 10:20 AM

## 2011-06-08 NOTE — Progress Notes (Signed)
Utilization Review Completed.  [place] T  06/08/2011

## 2011-06-09 LAB — COMPREHENSIVE METABOLIC PANEL
ALT: 16 U/L (ref 0–53)
AST: 51 U/L — ABNORMAL HIGH (ref 0–37)
Albumin: 2.6 g/dL — ABNORMAL LOW (ref 3.5–5.2)
Alkaline Phosphatase: 121 U/L — ABNORMAL HIGH (ref 39–117)
BUN: 26 mg/dL — ABNORMAL HIGH (ref 6–23)
CO2: 21 mEq/L (ref 19–32)
Calcium: 8.5 mg/dL (ref 8.4–10.5)
Chloride: 106 mEq/L (ref 96–112)
Creatinine, Ser: 1.57 mg/dL — ABNORMAL HIGH (ref 0.50–1.35)
GFR calc Af Amer: 56 mL/min — ABNORMAL LOW (ref >90)
GFR calc non Af Amer: 49 mL/min — ABNORMAL LOW (ref >90)
Glucose, Bld: 94 mg/dL (ref 70–99)
Potassium: 4.5 mEq/L (ref 3.5–5.1)
Sodium: 137 mEq/L (ref 135–145)
Total Bilirubin: 3.8 mg/dL — ABNORMAL HIGH (ref 0.3–1.2)
Total Protein: 6.1 g/dL (ref 6.0–8.3)

## 2011-06-09 LAB — RETICULOCYTES
RBC.: 2.74 MIL/uL — ABNORMAL LOW (ref 4.22–5.81)
Retic Count, Absolute: 564.4 10*3/uL — ABNORMAL HIGH (ref 19.0–186.0)
Retic Ct Pct: 20.6 % — ABNORMAL HIGH (ref 0.4–3.1)

## 2011-06-09 LAB — IRON AND TIBC
Iron: 147 ug/dL — ABNORMAL HIGH (ref 42–135)
Saturation Ratios: 61 % — ABNORMAL HIGH (ref 20–55)
TIBC: 242 ug/dL (ref 215–435)
UIBC: 95 ug/dL — ABNORMAL LOW (ref 125–400)

## 2011-06-09 LAB — CBC
HCT: 20 % — ABNORMAL LOW (ref 39.0–52.0)
Hemoglobin: 7.4 g/dL — ABNORMAL LOW (ref 13.0–17.0)
MCH: 31.4 pg (ref 26.0–34.0)
MCHC: 37 g/dL — ABNORMAL HIGH (ref 30.0–36.0)
MCV: 84.7 fL (ref 78.0–100.0)
Platelets: 104 10*3/uL — ABNORMAL LOW (ref 150–400)
RBC: 2.36 MIL/uL — ABNORMAL LOW (ref 4.22–5.81)
RDW: 23.1 % — ABNORMAL HIGH (ref 11.5–15.5)
WBC: 14.5 10*3/uL — ABNORMAL HIGH (ref 4.0–10.5)

## 2011-06-09 LAB — FERRITIN: Ferritin: 48 ng/mL (ref 22–322)

## 2011-06-09 LAB — FOLATE: Folate: 7.6 ng/mL

## 2011-06-09 LAB — HEPATITIS B CORE ANTIBODY, TOTAL: Hep B Core Total Ab: NEGATIVE

## 2011-06-09 LAB — T4, FREE: Free T4: 0.81 ng/dL (ref 0.80–1.80)

## 2011-06-09 LAB — ALPHA-1-ANTITRYPSIN: A-1 Antitrypsin, Ser: 204 mg/dL — ABNORMAL HIGH (ref 90–200)

## 2011-06-09 LAB — VITAMIN B12: Vitamin B-12: 410 pg/mL (ref 211–911)

## 2011-06-09 MED ORDER — LISINOPRIL 10 MG PO TABS
10.0000 mg | ORAL_TABLET | Freq: Two times a day (BID) | ORAL | Status: DC
  Administered 2011-06-09 – 2011-06-11 (×4): 10 mg via ORAL
  Filled 2011-06-09 (×5): qty 1

## 2011-06-09 MED ORDER — CARVEDILOL 3.125 MG PO TABS
3.1250 mg | ORAL_TABLET | Freq: Two times a day (BID) | ORAL | Status: DC
  Administered 2011-06-09 – 2011-06-10 (×2): 3.125 mg via ORAL
  Filled 2011-06-09 (×4): qty 1

## 2011-06-09 NOTE — Plan of Care (Signed)
Problem: Phase II Progression Outcomes Goal: Discharge plan established Outcome: Completed/Met Date Met:  06/09/11 Pt to return home when medically cleared

## 2011-06-09 NOTE — Progress Notes (Addendum)
Subjective: Doing better, swelling improving  Objective: Vital signs in last 24 hours: Temp:  [98 F (36.7 C)-98.3 F (36.8 C)] 98.3 F (36.8 C) (12/22 0519) Pulse Rate:  [75-97] 84  (12/22 0649) Resp:  [16-20] 16  (12/22 0519) BP: (129-160)/(71-87) 145/75 mmHg (12/22 0649) SpO2:  [90 %-98 %] 90 % (12/22 0519) Weight change:  Last BM Date: 06/07/11  Intake/Output from previous day: 12/21 0701 - 12/22 0700 In: 1542.5 [P.O.:598; I.V.:500; Blood:432.5; IV Piggyback:12] Out: [id number] [Urine:4680] Total I/O In: -  Out: 550 [Urine:550]   Physical Exam: General: Alert, awake, oriented x3, in no acute distress. HEENT: No bruits, no goiter. Heart: Regular rate and rhythm, without murmurs, rubs, gallops. Lungs: Clear to auscultation bilaterally. Abdomen: Soft, nontender, nondistended, positive bowel sounds, scrotal swelling improved Extremities: 2 plus edema,  Neuro: Grossly intact, nonfocal.  Lab Results: Basic Metabolic Panel:  Basename 06/09/11 0600 06/08/11 0625 06/07/11 1603  NA 137 136 --  K 4.5 4.5 --  CL 106 108 --  CO2 21 19 --  GLUCOSE 94 88 --  BUN 26* 28* --  CREATININE 1.57* 1.63* --  CALCIUM 8.5 8.1* --  MG -- 1.9 2.1  PHOS -- -- --   Liver Function Tests:  Basename 06/09/11 0600 06/07/11 1603  AST 51* 54*  ALT 16 19  ALKPHOS 121* 125*  BILITOT 3.8* 3.8*  PROT 6.1 6.1  ALBUMIN 2.6* 2.7*   No results found for this basename: LIPASE:2,AMYLASE:2 in the last 72 hours No results found for this basename: AMMONIA:2 in the last 72 hours CBC:  Basename 06/09/11 0600 06/08/11 0625 06/07/11 1603  WBC 14.5* 11.3* --  NEUTROABS -- -- 8.6*  HGB 7.4* 5.6* --  HCT 20.0* 15.3* --  MCV 84.7 86.4 --  PLT 104* 99* --   Cardiac Enzymes:  Basename 06/07/11 2331 06/07/11 1604  CKTOTAL 79 106  CKMB 2.5 2.9  CKMBINDEX -- --  TROPONINI <0.30 <0.30   BNP: No components found with this basename: POCBNP:3 D-Dimer: No results found for this basename: DDIMER:2 in  the last 72 hours CBG: No results found for this basename: GLUCAP:6 in the last 72 hours Hemoglobin A1C:  Basename 06/07/11 1603  HGBA1C <4.0   Fasting Lipid Panel: No results found for this basename: CHOL,HDL,LDLCALC,TRIG,CHOLHDL,LDLDIRECT in the last 72 hours Thyroid Function Tests:  Basename 06/08/11 1653 06/07/11 1603  TSH -- 4.901*  T4TOTAL -- --  FREET4 -- --  T3FREE 1.8* --  THYROIDAB -- --   Anemia Panel:  Basename 06/07/11 1720  VITAMINB12 1314*  FOLATE 18.6  FERRITIN 51  TIBC 241  IRON 91  RETICCTPCT 17.8*   Coagulation:  Basename 06/07/11 1603  LABPROT 16.7*  INR 1.33   Urine Drug Screen: Drugs of Abuse  No results found for this basename: labopia, cocainscrnur, labbenz, amphetmu, thcu, labbarb    Alcohol Level: No results found for this basename: ETH:2 in the last 72 hours  Recent Results (from the past 240 hour(s))  URINE CULTURE     Status: Normal   Collection Time   06/07/11  6:58 PM      Component Value Range Status Comment   Specimen Description URINE, CLEAN CATCH   Final    Special Requests NONE   Final    Setup Time [id number]   Final    Colony Count NO GROWTH   Final    Culture NO GROWTH   Final    Report Status 06/08/2011 FINAL   Final  Studies/Results: X-ray Chest Pa And Lateral   06/07/2011  *RADIOLOGY REPORT*  Clinical Data: Shortness of breath.  CHEST - 2 VIEW  Comparison: No comparison exams available for direct review. Report from 11/24/2002 is available.  Findings: Cardiomegaly.  Pulmonary vascular prominence / congestion most notable centrally.  No segmental infiltrate or pneumothorax.  Mildly tortuous aorta.  IMPRESSION: Cardiomegaly.  Pulmonary vascular prominence / congestion most notable centrally.  Original Report Authenticated By: Doug Sou, M.D.   US Abdomen Complete  06/08/2011  *RADIOLOGY REPORT*  Clinical Data: Cirrhosis  DUPLEX ULTRASOUND OF LIVER  Technique:  Color and duplex Doppler ultrasound was  performed to evaluate the hepatic in-flow and out-flow vessels.  Comparison:  No comparison studies available.  Portal Vein Velocities: Prox:      32 cm/sec.  Hepatopetal.  Mid:      29 cm/sec.  Hepatopetal Distal:        26 cm/sec.  Hepatopetal  There is detectable hepatopetal flow in the right and left portal veins.  Appropriate hepatofugal flow is seen in the right, middle, and left hepatic veins.  No evidence for varices. IVC flow is normal.  No evidence for thrombus or occlusion of the portal vein. The patient is noted to have ascites.  IMPRESSION: No evidence of portal vein thrombosis or occlusion.  Normal hepatopetal flow is identified within the portal venous system.  Hepatic veins are patent.  Original Report Authenticated By: ERIC A. MANSELL, M.D.   Ct Abdomen Pelvis W Contrast  06/08/2011  *RADIOLOGY REPORT*  Clinical Data: Anasarca, evaluate for venous obstruction.  CT ABDOMEN AND PELVIS WITH CONTRAST  Technique:  Multidetector CT imaging of the abdomen and pelvis was performed following the standard protocol during bolus administration of intravenous contrast.  Contrast: 20mL OMNIPAQUE IOHEXOL 300 MG/ML IV SOLN, 139mL OMNIPAQUE IOHEXOL 300 MG/ML IV SOLN  Comparison: None  Findings:  Cardiomegaly.  Bibasilar opacities.  Trace pleural effusions.  Lobular liver contour.  Cholelithiasis.  Auto infarcted spleen. Unremarkable pancreas.  Adrenal gland hyperplasia without focal abnormality.  Symmetric renal enhancement.  No hydronephrosis or hydroureter. There are a couple too small to further characterize hypodensities within the upper pole of the left kidney.  No bowel obstruction.  No CT evidence for colitis.  Normal appendix.  A small amount of ascites.  There is scattered atherosclerotic calcification of the aorta and its branches. No aneurysmal dilatation.  No lymphadenopathy.  No definitive evidence for thrombus within the IVC or iliac vessels.  Diffuse anasarca.  Multilevel sclerosis without acute  osseous abnormality.  IMPRESSION: Cardiomegaly.  Trace pericardial effusion with associate opacities; atelectasis versus pneumonia.  No filling defect identified within the veins of the abdomen pelvis.  Lobular liver contour, can be seen with early cirrhotic change.  Autosplenectomy.  Multilevel osteosclerosis without definite acute osseous abnormality.  Cholelithiasis.  Original Report Authenticated By: Suanne Marker, M.D.   [place] Art/ven Flow Abd Pelv Doppler  06/08/2011  *RADIOLOGY REPORT*  Clinical Data: Cirrhosis  DUPLEX ULTRASOUND OF LIVER  Technique:  Color and duplex Doppler ultrasound was performed to evaluate the hepatic in-flow and out-flow vessels.  Comparison:  No comparison studies available.  Portal Vein Velocities: Prox:      32 cm/sec.  Hepatopetal.  Mid:      29 cm/sec.  Hepatopetal Distal:        26 cm/sec.  Hepatopetal  There is detectable hepatopetal flow in the right and left portal veins.  Appropriate hepatofugal flow is seen in the right,  middle, and left hepatic veins.  No evidence for varices. IVC flow is normal.  No evidence for thrombus or occlusion of the portal vein. The patient is noted to have ascites.  IMPRESSION: No evidence of portal vein thrombosis or occlusion.  Normal hepatopetal flow is identified within the portal venous system.  Hepatic veins are patent.  Original Report Authenticated By: ERIC A. MANSELL, M.D.    Medications: Scheduled Meds:   . aspirin EC  81 mg Oral Daily  . digoxin  0.125 mg Oral Daily  . enoxaparin  40 mg Subcutaneous Q24H  . furosemide  60 mg Intravenous Q8H  . lisinopril  10 mg Oral Daily  . nicotine  14 mg Transdermal Daily  . oxyCODONE  5 mg Oral Once  . sodium chloride  3 mL Intravenous Q12H  . DISCONTD: furosemide  60 mg Intravenous Q12H   Continuous Infusions:  PRN Meds:.sodium chloride, acetaminophen, acetaminophen, alum & mag hydroxide-simeth, ondansetron (ZOFRAN) IV, ondansetron, oxyCODONE, senna-docusate, sodium  chloride  Assessment/Plan: 1.Acute Systolic heart failure with Anasarca 2. Early cardiac cirrhosis 3. HTN (hypertension) 5. ARF/Renal insufficiency with nephrotic range proteinuria 6. Anemia due to chronic disease/Sickle cell disease s/p @ units PRBC yesterday  Plan continue diuresis, digoxin and lisinopril per cardiology, check dig level due to renal insufficiency. Etiology of cardiomyopathy not clear, suspect non-ischemic, though a cath would be helpful Anemia panel still pending, check hemoccult Appreciate Cardiology assistance    LOS: 2 days   [facility] Pager: [phone] 06/09/2011, 9:34 AM

## 2011-06-09 NOTE — Progress Notes (Signed)
Subjective:   Diuresing briskly. Down 5L since yesterday. Breathing much better. Still with edema. No orthopnea or PND. No signs of ETOH withdrawal.     Intake/Output Summary (Last 24 hours) at 06/09/11 1058 Last data filed at 06/09/11 1011  Gross per 24 hour  Intake 1427.5 ml  Output   5680 ml  Net -4252.5 ml    Current meds:    . aspirin EC  81 mg Oral Daily  . digoxin  0.125 mg Oral Daily  . enoxaparin  40 mg Subcutaneous Q24H  . furosemide  60 mg Intravenous Q8H  . lisinopril  10 mg Oral Daily  . nicotine  14 mg Transdermal Daily  . oxyCODONE  5 mg Oral Once  . sodium chloride  3 mL Intravenous Q12H  . DISCONTD: furosemide  60 mg Intravenous Q12H   Infusions:     Objective:  Blood pressure 123/69, pulse 84, temperature 98.3 F (36.8 C), temperature source Oral, resp. rate 16, height 5\' 11"  (1.803 m), weight 80.105 kg (176 lb 9.6 oz), SpO2 90.00%. Weight change:   Physical Exam: General: Looks younger than stated age. No respiratory difficulty  HEENT: normal  Neck: supple. JVP 7-8  Carotids 2+ bilat; no bruits. No lymphadenopathy or thryomegaly appreciated.  Cor: PMI laterally displaced. Regular. Tachy. +s3 2/6 TR  Lungs: basilar crackles  Abdomen: soft, + mild distended. Liver edge down and pulsatile. No bruits or masses. Good bowel sounds.  Extremities: no cyanosis, + clubbing, no rash, 2+ edema + scrotal edema  Neuro: alert & oriented x 3, cranial nerves grossly intact. moves all 4 extremities w/o difficulty. Affect pleasant.   Lab Results: Basic Metabolic Panel:  Lab 0000000 0600 06/08/11 0625 06/07/11 1603  NA 137 136 135  K 4.5 4.5 --  CL 106 108 107  CO2 21 19 17*  GLUCOSE 94 88 91  BUN 26* 28* 29*  CREATININE 1.57* 1.63* 1.69*  CALCIUM 8.5 8.1* 7.9*  MG -- 1.9 2.1  PHOS -- -- --   Liver Function Tests:  Lab 06/09/11 0600 06/07/11 1603  AST 51* 54*  ALT 16 19  ALKPHOS 121* 125*  BILITOT 3.8* 3.8*  PROT 6.1 6.1  ALBUMIN 2.6* 2.7*    No results found for this basename: LIPASE:5,AMYLASE:5 in the last 168 hours No results found for this basename: AMMONIA:5 in the last 168 hours CBC:  Lab 06/09/11 0600 06/08/11 0625 06/07/11 1603  WBC 14.5* 11.3* 11.1*  NEUTROABS -- -- 8.6*  HGB 7.4* 5.6* 6.2*  HCT 20.0* 15.3* 16.8*  MCV 84.7 86.4 87.0  PLT 104* 99* 112*   Cardiac Enzymes:  Lab 06/07/11 2331 06/07/11 1604  CKTOTAL 79 106  CKMB 2.5 2.9  CKMBINDEX -- --  TROPONINI <0.30 <0.30   BNP: No components found with this basename: POCBNP:5 CBG: No results found for this basename: GLUCAP:5 in the last 168 hours Microbiology: Lab Results  Component Value Date   CULT NO GROWTH 06/07/2011    Lab 06/07/11 1858  CULT NO GROWTH  SDES URINE, CLEAN CATCH    Imaging: X-ray Chest Pa And Lateral   06/07/2011  *RADIOLOGY REPORT*  Clinical Data: Shortness of breath.  CHEST - 2 VIEW  Comparison: No comparison exams available for direct review. Report from 11/24/2002 is available.  Findings: Cardiomegaly.  Pulmonary vascular prominence / congestion most notable centrally.  No segmental infiltrate or pneumothorax.  Mildly tortuous aorta.  IMPRESSION: Cardiomegaly.  Pulmonary vascular prominence / congestion most notable centrally.  Original  Report Authenticated By: Doug Sou, M.D.   US Abdomen Complete  06/08/2011  *RADIOLOGY REPORT*  Clinical Data: Cirrhosis  DUPLEX ULTRASOUND OF LIVER  Technique:  Color and duplex Doppler ultrasound was performed to evaluate the hepatic in-flow and out-flow vessels.  Comparison:  No comparison studies available.  Portal Vein Velocities: Prox:      32 cm/sec.  Hepatopetal.  Mid:      29 cm/sec.  Hepatopetal Distal:        26 cm/sec.  Hepatopetal  There is detectable hepatopetal flow in the right and left portal veins.  Appropriate hepatofugal flow is seen in the right, middle, and left hepatic veins.  No evidence for varices. IVC flow is normal.  No evidence for thrombus or occlusion of the  portal vein. The patient is noted to have ascites.  IMPRESSION: No evidence of portal vein thrombosis or occlusion.  Normal hepatopetal flow is identified within the portal venous system.  Hepatic veins are patent.  Original Report Authenticated By: ERIC A. MANSELL, M.D.   Ct Abdomen Pelvis W Contrast  06/08/2011  *RADIOLOGY REPORT*  Clinical Data: Anasarca, evaluate for venous obstruction.  CT ABDOMEN AND PELVIS WITH CONTRAST  Technique:  Multidetector CT imaging of the abdomen and pelvis was performed following the standard protocol during bolus administration of intravenous contrast.  Contrast: 57mL OMNIPAQUE IOHEXOL 300 MG/ML IV SOLN, 169mL OMNIPAQUE IOHEXOL 300 MG/ML IV SOLN  Comparison: None  Findings:  Cardiomegaly.  Bibasilar opacities.  Trace pleural effusions.  Lobular liver contour.  Cholelithiasis.  Auto infarcted spleen. Unremarkable pancreas.  Adrenal gland hyperplasia without focal abnormality.  Symmetric renal enhancement.  No hydronephrosis or hydroureter. There are a couple too small to further characterize hypodensities within the upper pole of the left kidney.  No bowel obstruction.  No CT evidence for colitis.  Normal appendix.  A small amount of ascites.  There is scattered atherosclerotic calcification of the aorta and its branches. No aneurysmal dilatation.  No lymphadenopathy.  No definitive evidence for thrombus within the IVC or iliac vessels.  Diffuse anasarca.  Multilevel sclerosis without acute osseous abnormality.  IMPRESSION: Cardiomegaly.  Trace pericardial effusion with associate opacities; atelectasis versus pneumonia.  No filling defect identified within the veins of the abdomen pelvis.  Lobular liver contour, can be seen with early cirrhotic change.  Autosplenectomy.  Multilevel osteosclerosis without definite acute osseous abnormality.  Cholelithiasis.  Original Report Authenticated By: Suanne Marker, M.D.   [place] Art/ven Flow Abd Pelv Doppler  06/08/2011  *RADIOLOGY  REPORT*  Clinical Data: Cirrhosis  DUPLEX ULTRASOUND OF LIVER  Technique:  Color and duplex Doppler ultrasound was performed to evaluate the hepatic in-flow and out-flow vessels.  Comparison:  No comparison studies available.  Portal Vein Velocities: Prox:      32 cm/sec.  Hepatopetal.  Mid:      29 cm/sec.  Hepatopetal Distal:        26 cm/sec.  Hepatopetal  There is detectable hepatopetal flow in the right and left portal veins.  Appropriate hepatofugal flow is seen in the right, middle, and left hepatic veins.  No evidence for varices. IVC flow is normal.  No evidence for thrombus or occlusion of the portal vein. The patient is noted to have ascites.  IMPRESSION: No evidence of portal vein thrombosis or occlusion.  Normal hepatopetal flow is identified within the portal venous system.  Hepatic veins are patent.  Original Report Authenticated By: ERIC A. MANSELL, M.D.     ASSESSMENT:  1) Acute systolic HF with marked volume overload       --EF 30-40% (etiology unclear) 2) Sickle cell anemia  3) Acute respiratory distress, improved  4) Renal failure, stage 3  5) Tobacco use  6) Moderate ETOH use   PLAN/DISCUSSION:  Improving with diuresis, ace-I and digoxin. Still with volume on board. Continue IV lasix. Start low-dose b-blocker. Will probably need cath next week depending on renal function. Titrate ace-i as tolerated,   LOS: 2 days    Glori Bickers, MD 06/09/2011, 10:58 AM

## 2011-06-10 ENCOUNTER — Other Ambulatory Visit: Payer: Self-pay

## 2011-06-10 LAB — BASIC METABOLIC PANEL
BUN: 26 mg/dL — ABNORMAL HIGH (ref 6–23)
BUN: 26 mg/dL — ABNORMAL HIGH (ref 6–23)
CO2: 26 mEq/L (ref 19–32)
CO2: 26 mEq/L (ref 19–32)
Calcium: 8.1 mg/dL — ABNORMAL LOW (ref 8.4–10.5)
Calcium: 8 mg/dL — ABNORMAL LOW (ref 8.4–10.5)
Chloride: 102 mEq/L (ref 96–112)
Chloride: 101 mEq/L (ref 96–112)
Creatinine, Ser: 1.44 mg/dL — ABNORMAL HIGH (ref 0.50–1.35)
Creatinine, Ser: 1.45 mg/dL — ABNORMAL HIGH (ref 0.50–1.35)
GFR calc Af Amer: 63 mL/min — ABNORMAL LOW (ref >90)
GFR calc Af Amer: 62 mL/min — ABNORMAL LOW (ref >90)
GFR calc non Af Amer: 54 mL/min — ABNORMAL LOW (ref >90)
GFR calc non Af Amer: 54 mL/min — ABNORMAL LOW (ref >90)
Glucose, Bld: 114 mg/dL — ABNORMAL HIGH (ref 70–99)
Glucose, Bld: 102 mg/dL — ABNORMAL HIGH (ref 70–99)
Potassium: 4.1 mEq/L (ref 3.5–5.1)
Potassium: 4.3 mEq/L (ref 3.5–5.1)
Sodium: 137 mEq/L (ref 135–145)
Sodium: 137 mEq/L (ref 135–145)

## 2011-06-10 LAB — CBC
HCT: 22.4 % — ABNORMAL LOW (ref 39.0–52.0)
Hemoglobin: 8.1 g/dL — ABNORMAL LOW (ref 13.0–17.0)
MCH: 30.8 pg (ref 26.0–34.0)
MCHC: 36.2 g/dL — ABNORMAL HIGH (ref 30.0–36.0)
MCV: 85.2 fL (ref 78.0–100.0)
Platelets: 137 10*3/uL — ABNORMAL LOW (ref 150–400)
RBC: 2.63 MIL/uL — ABNORMAL LOW (ref 4.22–5.81)
RDW: 23.1 % — ABNORMAL HIGH (ref 11.5–15.5)
WBC: 12.2 10*3/uL — ABNORMAL HIGH (ref 4.0–10.5)

## 2011-06-10 LAB — HIV ANTIBODY (ROUTINE TESTING W REFLEX): HIV: NONREACTIVE

## 2011-06-10 LAB — OCCULT BLOOD X 1 CARD TO LAB, STOOL: Fecal Occult Bld: NEGATIVE

## 2011-06-10 MED ORDER — SODIUM CHLORIDE 0.9 % IJ SOLN: 3.0000 mL | INTRAMUSCULAR | Status: DC | PRN

## 2011-06-10 MED ORDER — SODIUM CHLORIDE 0.9 % IJ SOLN
3.0000 mL | Freq: Two times a day (BID) | INTRAMUSCULAR | Status: DC
  Administered 2011-06-10 – 2011-06-12 (×2): 3 mL via INTRAVENOUS

## 2011-06-10 MED ORDER — SODIUM CHLORIDE 0.9 % IV SOLN: 250.0000 mL | INTRAVENOUS | Status: DC | PRN

## 2011-06-10 MED ORDER — ASPIRIN 81 MG PO CHEW
324.0000 mg | CHEWABLE_TABLET | ORAL | Status: AC
  Administered 2011-06-11: 324 mg via ORAL
  Filled 2011-06-10: qty 4

## 2011-06-10 MED ORDER — ASPIRIN 81 MG PO CHEW
324.0000 mg | CHEWABLE_TABLET | ORAL | Status: AC
  Filled 2011-06-10: qty 4

## 2011-06-10 MED ORDER — SODIUM CHLORIDE 0.9 % IV SOLN
INTRAVENOUS | Status: DC
  Administered 2011-06-11: 07:00:00 via INTRAVENOUS

## 2011-06-10 MED ORDER — SODIUM CHLORIDE 0.9 % IJ SOLN
3.0000 mL | Freq: Two times a day (BID) | INTRAMUSCULAR | Status: DC
  Administered 2011-06-11 – 2011-06-12 (×4): 3 mL via INTRAVENOUS

## 2011-06-10 MED ORDER — CARVEDILOL 6.25 MG PO TABS
6.2500 mg | ORAL_TABLET | Freq: Two times a day (BID) | ORAL | Status: DC
  Administered 2011-06-10 – 2011-06-12 (×5): 6.25 mg via ORAL
  Filled 2011-06-10 (×6): qty 1

## 2011-06-10 NOTE — Progress Notes (Signed)
Subjective:   Continues to diurese briskly. Down another 3L since yesterday.  No weights recorded. Breathing much better. Still with edema. No orthopnea or PND. No signs of ETOH withdrawal. Tolerating ACE-I and b-block   Intake/Output Summary (Last 24 hours) at 06/10/11 1010 Last data filed at 06/10/11 0958  Gross per 24 hour  Intake    740 ml  Output   4000 ml  Net  -3260 ml    Current meds:    . aspirin EC  81 mg Oral Daily  . carvedilol  3.125 mg Oral BID WC  . digoxin  0.125 mg Oral Daily  . enoxaparin  40 mg Subcutaneous Q24H  . furosemide  60 mg Intravenous Q8H  . lisinopril  10 mg Oral BID  . nicotine  14 mg Transdermal Daily  . sodium chloride  3 mL Intravenous Q12H  . DISCONTD: lisinopril  10 mg Oral Daily   Infusions:     Objective:  Blood pressure 115/71, pulse 82, temperature 98.5 F (36.9 C), temperature source Oral, resp. rate 20, height 5' 10.95" (1.802 m), weight 80.105 kg (176 lb 9.6 oz), SpO2 95.00%. Weight change:   Physical Exam: General: Looks younger than stated age. No respiratory difficulty  HEENT: normal  Neck: supple. JVP 8-9  Carotids 2+ bilat; no bruits. No lymphadenopathy or thryomegaly appreciated.  Cor: PMI laterally displaced. Regular. Tachy. +s3 2/6 TR  Lungs: basilar crackles  Abdomen: soft, + mild distended. Liver edge down and pulsatile. No bruits or masses. Good bowel sounds.  Extremities: no cyanosis, + clubbing, no rash, 1+ edema + resolving scrotal edema  Neuro: alert & oriented x 3, cranial nerves grossly intact. moves all 4 extremities w/o difficulty. Affect pleasant.   Lab Results: Basic Metabolic Panel:  Lab [id number] 06/09/11 0600 06/08/11 0625 06/07/11 1603  NA 137 137 136 135  K 4.1 4.5 -- --  CL 102 106 108 107  CO2 26 21 19  17*  GLUCOSE 114* 94 88 91  BUN 26* 26* 28* 29*  CREATININE 1.44* 1.57* 1.63* 1.69*  CALCIUM 8.1* 8.5 8.1* 7.9*  MG -- -- 1.9 2.1  PHOS -- -- -- --   Liver Function Tests:  Lab  06/09/11 0600 06/07/11 1603  AST 51* 54*  ALT 16 19  ALKPHOS 121* 125*  BILITOT 3.8* 3.8*  PROT 6.1 6.1  ALBUMIN 2.6* 2.7*   No results found for this basename: LIPASE:5,AMYLASE:5 in the last 168 hours No results found for this basename: AMMONIA:5 in the last 168 hours CBC:  Lab 06/09/11 0600 06/08/11 0625 06/07/11 1603  WBC 14.5* 11.3* 11.1*  NEUTROABS -- -- 8.6*  HGB 7.4* 5.6* 6.2*  HCT 20.0* 15.3* 16.8*  MCV 84.7 86.4 87.0  PLT 104* 99* 112*   Cardiac Enzymes:  Lab 06/07/11 2331 06/07/11 1604  CKTOTAL 79 106  CKMB 2.5 2.9  CKMBINDEX -- --  TROPONINI <0.30 <0.30   BNP: No components found with this basename: POCBNP:5 CBG: No results found for this basename: GLUCAP:5 in the last 168 hours Microbiology: Lab Results  Component Value Date   CULT NO GROWTH 06/07/2011    Lab 06/07/11 1858  CULT NO GROWTH  SDES URINE, CLEAN CATCH    Imaging: US Abdomen Complete  06/08/2011  *RADIOLOGY REPORT*  Clinical Data: Cirrhosis  DUPLEX ULTRASOUND OF LIVER  Technique:  Color and duplex Doppler ultrasound was performed to evaluate the hepatic in-flow and out-flow vessels.  Comparison:  No comparison studies available.  Portal Vein  Velocities: Prox:      32 cm/sec.  Hepatopetal.  Mid:      29 cm/sec.  Hepatopetal Distal:        26 cm/sec.  Hepatopetal  There is detectable hepatopetal flow in the right and left portal veins.  Appropriate hepatofugal flow is seen in the right, middle, and left hepatic veins.  No evidence for varices. IVC flow is normal.  No evidence for thrombus or occlusion of the portal vein. The patient is noted to have ascites.  IMPRESSION: No evidence of portal vein thrombosis or occlusion.  Normal hepatopetal flow is identified within the portal venous system.  Hepatic veins are patent.  Original Report Authenticated By: ERIC A. MANSELL, M.D.   [place] Art/ven Flow Abd Pelv Doppler  06/08/2011  *RADIOLOGY REPORT*  Clinical Data: Cirrhosis  DUPLEX ULTRASOUND OF LIVER   Technique:  Color and duplex Doppler ultrasound was performed to evaluate the hepatic in-flow and out-flow vessels.  Comparison:  No comparison studies available.  Portal Vein Velocities: Prox:      32 cm/sec.  Hepatopetal.  Mid:      29 cm/sec.  Hepatopetal Distal:        26 cm/sec.  Hepatopetal  There is detectable hepatopetal flow in the right and left portal veins.  Appropriate hepatofugal flow is seen in the right, middle, and left hepatic veins.  No evidence for varices. IVC flow is normal.  No evidence for thrombus or occlusion of the portal vein. The patient is noted to have ascites.  IMPRESSION: No evidence of portal vein thrombosis or occlusion.  Normal hepatopetal flow is identified within the portal venous system.  Hepatic veins are patent.  Original Report Authenticated By: ERIC A. MANSELL, M.D.     ASSESSMENT:  1) Acute systolic HF with marked volume overload       --EF 30-40% (etiology unclear) 2) Sickle cell anemia  3) Acute respiratory distress, improved  4) Renal failure, stage 3  5) Tobacco use  6) Moderate ETOH use   PLAN/DISCUSSION:  Continues to improve. Renal function improving. Will continue diuresis. Titrate carvedilol to 6.25 bid. Plan R & L cath tomorrow with minimal contrast. (No LV gram)   LOS: 3 days  Glori Bickers, MD 06/10/2011, 10:10 AM

## 2011-06-10 NOTE — Progress Notes (Signed)
Subjective: Doing better, swelling improving  Objective: Vital signs in last 24 hours: Temp:  [97 F (36.1 C)-98.5 F (36.9 C)] 98.5 F (36.9 C) (12/23 0709) Pulse Rate:  [82-87] 82  (12/23 0952) Resp:  [16-20] 20  (12/23 0709) BP: (115-135)/(70-71) 115/71 mmHg (12/23 0709) SpO2:  [92 %-100 %] 95 % (12/23 0709) Weight:  [67.2 kg (148 lb 2.4 oz)] 148 lb 2.4 oz (67.2 kg) (12/23 0952) Weight change:  Last BM Date: 06/09/11  Intake/Output from previous day: 12/22 0701 - 12/23 0700 In: [id number] [P.O.:720; I.V.:23] Out: 3500 [Urine:3500] Total I/O In: 240 [P.O.:240] Out: 1700 [Urine:1700]   Physical Exam: General: Alert, awake, oriented x3, in no acute distress. HEENT: No bruits, no goiter. Heart: Regular rate and rhythm, without murmurs, rubs, gallops. Lungs: Clear to auscultation bilaterally. Abdomen: Soft, nontender, nondistended, positive bowel sounds, scrotal swelling improved Extremities: 2 plus edema,  Neuro: Grossly intact, nonfocal.  Lab Results: Basic Metabolic Panel:  Basename 06/10/11 0757 06/09/11 0600 06/08/11 0625 06/07/11 1603  NA 137 137 -- --  K 4.1 4.5 -- --  CL 102 106 -- --  CO2 26 21 -- --  GLUCOSE 114* 94 -- --  BUN 26* 26* -- --  CREATININE 1.44* 1.57* -- --  CALCIUM 8.1* 8.5 -- --  MG -- -- 1.9 2.1  PHOS -- -- -- --   Liver Function Tests:  Basename 06/09/11 0600 06/07/11 1603  AST 51* 54*  ALT 16 19  ALKPHOS 121* 125*  BILITOT 3.8* 3.8*  PROT 6.1 6.1  ALBUMIN 2.6* 2.7*   No results found for this basename: LIPASE:2,AMYLASE:2 in the last 72 hours No results found for this basename: AMMONIA:2 in the last 72 hours CBC:  Basename 06/09/11 0600 06/08/11 0625 06/07/11 1603  WBC 14.5* 11.3* --  NEUTROABS -- -- 8.6*  HGB 7.4* 5.6* --  HCT 20.0* 15.3* --  MCV 84.7 86.4 --  PLT 104* 99* --   Cardiac Enzymes:  Basename 06/07/11 2331 06/07/11 1604  CKTOTAL 79 106  CKMB 2.5 2.9  CKMBINDEX -- --  TROPONINI <0.30 <0.30   BNP: No  components found with this basename: POCBNP:3 D-Dimer: No results found for this basename: DDIMER:2 in the last 72 hours CBG: No results found for this basename: GLUCAP:6 in the last 72 hours Hemoglobin A1C:  Basename 06/07/11 1603  HGBA1C <4.0   Fasting Lipid Panel: No results found for this basename: CHOL,HDL,LDLCALC,TRIG,CHOLHDL,LDLDIRECT in the last 72 hours Thyroid Function Tests:  Basename 06/09/11 0600 06/08/11 1653 06/07/11 1603  TSH -- -- 4.901*  T4TOTAL -- -- --  FREET4 0.81 -- --  T3FREE -- 1.8* --  THYROIDAB -- -- --   Anemia Panel:  Basename 06/09/11 1043  VITAMINB12 410  FOLATE 7.6  FERRITIN 48  TIBC 242  IRON 147*  RETICCTPCT 20.6*   Coagulation:  Basename 06/07/11 1603  LABPROT 16.7*  INR 1.33   Urine Drug Screen: Drugs of Abuse  No results found for this basename: labopia,  cocainscrnur,  labbenz,  amphetmu,  thcu,  labbarb    Alcohol Level: No results found for this basename: ETH:2 in the last 72 hours  Recent Results (from the past 240 hour(s))  URINE CULTURE     Status: Normal   Collection Time   06/07/11  6:58 PM      Component Value Range Status Comment   Specimen Description URINE, CLEAN CATCH   Final    Special Requests NONE   Final    Setup  Time [id number]   Final    Colony Count NO GROWTH   Final    Culture NO GROWTH   Final    Report Status 06/08/2011 FINAL   Final     Studies/Results: US Abdomen Complete  06/08/2011  *RADIOLOGY REPORT*  Clinical Data: Cirrhosis  DUPLEX ULTRASOUND OF LIVER  Technique:  Color and duplex Doppler ultrasound was performed to evaluate the hepatic in-flow and out-flow vessels.  Comparison:  No comparison studies available.  Portal Vein Velocities: Prox:      32 cm/sec.  Hepatopetal.  Mid:      29 cm/sec.  Hepatopetal Distal:        26 cm/sec.  Hepatopetal  There is detectable hepatopetal flow in the right and left portal veins.  Appropriate hepatofugal flow is seen in the right, middle, and left  hepatic veins.  No evidence for varices. IVC flow is normal.  No evidence for thrombus or occlusion of the portal vein. The patient is noted to have ascites.  IMPRESSION: No evidence of portal vein thrombosis or occlusion.  Normal hepatopetal flow is identified within the portal venous system.  Hepatic veins are patent.  Original Report Authenticated By: ERIC A. MANSELL, M.D.   [place] Art/ven Flow Abd Pelv Doppler  06/08/2011  *RADIOLOGY REPORT*  Clinical Data: Cirrhosis  DUPLEX ULTRASOUND OF LIVER  Technique:  Color and duplex Doppler ultrasound was performed to evaluate the hepatic in-flow and out-flow vessels.  Comparison:  No comparison studies available.  Portal Vein Velocities: Prox:      32 cm/sec.  Hepatopetal.  Mid:      29 cm/sec.  Hepatopetal Distal:        26 cm/sec.  Hepatopetal  There is detectable hepatopetal flow in the right and left portal veins.  Appropriate hepatofugal flow is seen in the right, middle, and left hepatic veins.  No evidence for varices. IVC flow is normal.  No evidence for thrombus or occlusion of the portal vein. The patient is noted to have ascites.  IMPRESSION: No evidence of portal vein thrombosis or occlusion.  Normal hepatopetal flow is identified within the portal venous system.  Hepatic veins are patent.  Original Report Authenticated By: ERIC A. MANSELL, M.D.    Medications: Scheduled Meds:    . aspirin  324 mg Oral Pre-Cath  . aspirin  324 mg Oral Pre-Cath  . aspirin EC  81 mg Oral Daily  . carvedilol  6.25 mg Oral BID WC  . digoxin  0.125 mg Oral Daily  . enoxaparin  40 mg Subcutaneous Q24H  . furosemide  60 mg Intravenous Q8H  . lisinopril  10 mg Oral BID  . nicotine  14 mg Transdermal Daily  . sodium chloride  3 mL Intravenous Q12H  . sodium chloride  3 mL Intravenous Q12H  . sodium chloride  3 mL Intravenous Q12H  . DISCONTD: carvedilol  3.125 mg Oral BID WC   Continuous Infusions:    . sodium chloride     PRN Meds:.sodium chloride, sodium  chloride, sodium chloride, acetaminophen, acetaminophen, alum & mag hydroxide-simeth, ondansetron (ZOFRAN) IV, ondansetron, oxyCODONE, senna-docusate, sodium chloride, sodium chloride, sodium chloride  Assessment/Plan: 1.Acute Systolic heart failure with Anasarca 2. Early cardiac cirrhosis 3. HTN (hypertension) 5. ARF/Renal insufficiency with nephrotic range proteinuria 6. Anemia due to chronic disease/Sickle cell disease s/p 2 units PRBC 12/22  Plan continue diuresis with lasix 60mg  IV Q8 digoxin and lisinopril per cardiology,  Lost about 30lbs of fluid wt Etiology of cardiomyopathy not  clear, suspect non-ischemic, cath planned for 12/24 Anemia panel consistent with anemia of chronic disease/sickle cell    LOS: 3 days   [facility] Pager: [phone] 06/10/2011, 12:17 PM

## 2011-06-10 NOTE — Plan of Care (Signed)
Problem: Phase II Progression Outcomes Goal: Other Phase II Outcomes/Goals Outcome: Completed/Met Date Met:  06/10/11 Verbal Pt education on cardiac catherization

## 2011-06-11 ENCOUNTER — Encounter (HOSPITAL_COMMUNITY)
Admission: AD | Disposition: A | Discharge status: Home / Self Care | Payer: Self-pay | Source: Ambulatory Visit | Attending: Internal Medicine

## 2011-06-11 DIAGNOSIS — R079 Chest pain, unspecified: Secondary | ICD-10-CM

## 2011-06-11 HISTORY — PX: LEFT AND RIGHT HEART CATHETERIZATION WITH CORONARY ANGIOGRAM: SHX5449

## 2011-06-11 LAB — POCT I-STAT 3, VENOUS BLOOD GAS (G3P V)
Acid-base deficit: 1 mmol/L (ref 0.0–2.0)
Bicarbonate: 24.6 mEq/L — ABNORMAL HIGH (ref 20.0–24.0)
O2 Saturation: 78 %
TCO2: 26 mmol/L (ref 0–100)
pCO2, Ven: 45.3 mmHg (ref 45.0–50.0)
pH, Ven: 7.342 — ABNORMAL HIGH (ref 7.250–7.300)
pO2, Ven: 45 mmHg (ref 30.0–45.0)

## 2011-06-11 LAB — CBC
HCT: 22.6 % — ABNORMAL LOW (ref 39.0–52.0)
HCT: 22 % — ABNORMAL LOW (ref 39.0–52.0)
Hemoglobin: 8.2 g/dL — ABNORMAL LOW (ref 13.0–17.0)
Hemoglobin: 7.8 g/dL — ABNORMAL LOW (ref 13.0–17.0)
MCH: 30.9 pg (ref 26.0–34.0)
MCH: 30.1 pg (ref 26.0–34.0)
MCHC: 36.3 g/dL — ABNORMAL HIGH (ref 30.0–36.0)
MCHC: 35.5 g/dL (ref 30.0–36.0)
MCV: 85.3 fL (ref 78.0–100.0)
MCV: 84.9 fL (ref 78.0–100.0)
Platelets: 124 10*3/uL — ABNORMAL LOW (ref 150–400)
Platelets: 106 10*3/uL — ABNORMAL LOW (ref 150–400)
RBC: 2.65 MIL/uL — ABNORMAL LOW (ref 4.22–5.81)
RBC: 2.59 MIL/uL — ABNORMAL LOW (ref 4.22–5.81)
RDW: 22.1 % — ABNORMAL HIGH (ref 11.5–15.5)
RDW: 21.8 % — ABNORMAL HIGH (ref 11.5–15.5)
WBC: 12 10*3/uL — ABNORMAL HIGH (ref 4.0–10.5)
WBC: 12.6 10*3/uL — ABNORMAL HIGH (ref 4.0–10.5)

## 2011-06-11 LAB — BASIC METABOLIC PANEL
BUN: 32 mg/dL — ABNORMAL HIGH (ref 6–23)
CO2: 26 mEq/L (ref 19–32)
Calcium: 8.1 mg/dL — ABNORMAL LOW (ref 8.4–10.5)
Chloride: 100 mEq/L (ref 96–112)
Creatinine, Ser: 1.67 mg/dL — ABNORMAL HIGH (ref 0.50–1.35)
GFR calc Af Amer: 52 mL/min — ABNORMAL LOW (ref >90)
GFR calc non Af Amer: 45 mL/min — ABNORMAL LOW (ref >90)
Glucose, Bld: 95 mg/dL (ref 70–99)
Potassium: 4.1 mEq/L (ref 3.5–5.1)
Sodium: 135 mEq/L (ref 135–145)

## 2011-06-11 LAB — HEPATITIS PANEL, ACUTE
HCV Ab: NEGATIVE
Hep A IgM: NEGATIVE
Hep B C IgM: NEGATIVE
Hepatitis B Surface Ag: NEGATIVE

## 2011-06-11 LAB — POCT I-STAT 3, ART BLOOD GAS (G3+)
Acid-base deficit: 9 mmol/L — ABNORMAL HIGH (ref 0.0–2.0)
Bicarbonate: 16.3 mEq/L — ABNORMAL LOW (ref 20.0–24.0)
O2 Saturation: 99 %
TCO2: 17 mmol/L (ref 0–100)
pCO2 arterial: 31.1 mmHg — ABNORMAL LOW (ref 35.0–45.0)
pH, Arterial: 7.329 — ABNORMAL LOW (ref 7.350–7.450)
pO2, Arterial: 157 mmHg — ABNORMAL HIGH (ref 80.0–100.0)

## 2011-06-11 LAB — CREATININE, SERUM
Creatinine, Ser: 1.65 mg/dL — ABNORMAL HIGH (ref 0.50–1.35)
GFR calc Af Amer: 53 mL/min — ABNORMAL LOW (ref >90)
GFR calc non Af Amer: 46 mL/min — ABNORMAL LOW (ref >90)

## 2011-06-11 LAB — ANTI-SMOOTH MUSCLE ANTIBODY, IGG: F-Actin IgG: 5 U (ref <20)

## 2011-06-11 SURGERY — LEFT AND RIGHT HEART CATHETERIZATION WITH CORONARY ANGIOGRAM
Anesthesia: LOCAL

## 2011-06-11 MED ORDER — SODIUM CHLORIDE 0.9 % IV SOLN: INTRAVENOUS | Status: DC

## 2011-06-11 MED ORDER — ACETAMINOPHEN 325 MG PO TABS: 650.0000 mg | ORAL_TABLET | ORAL | Status: DC | PRN

## 2011-06-11 MED ORDER — FENTANYL CITRATE 0.05 MG/ML IJ SOLN
INTRAMUSCULAR | Status: AC
  Filled 2011-06-11: qty 2

## 2011-06-11 MED ORDER — ZOLPIDEM TARTRATE 5 MG PO TABS
5.0000 mg | ORAL_TABLET | Freq: Every evening | ORAL | Status: DC | PRN
  Administered 2011-06-11: 5 mg via ORAL
  Filled 2011-06-11: qty 1

## 2011-06-11 MED ORDER — HEPARIN SODIUM (PORCINE) 5000 UNIT/ML IJ SOLN
5000.0000 [IU] | Freq: Three times a day (TID) | INTRAMUSCULAR | Status: DC
  Administered 2011-06-11 – 2011-06-12 (×4): 5000 [IU] via SUBCUTANEOUS
  Filled 2011-06-11 (×6): qty 1

## 2011-06-11 MED ORDER — ONDANSETRON HCL 4 MG/2ML IJ SOLN: 4.0000 mg | Freq: Four times a day (QID) | INTRAMUSCULAR | Status: DC | PRN

## 2011-06-11 MED ORDER — NITROGLYCERIN 0.2 MG/ML ON CALL CATH LAB
INTRAVENOUS | Status: AC
  Filled 2011-06-11: qty 1

## 2011-06-11 MED ORDER — SODIUM CHLORIDE 0.9 % IV SOLN
1.0000 mL/kg/h | INTRAVENOUS | Status: AC
  Administered 2011-06-11: 1 mL/kg/h via INTRAVENOUS

## 2011-06-11 MED ORDER — HEPARIN (PORCINE) IN NACL 2-0.9 UNIT/ML-% IJ SOLN
INTRAMUSCULAR | Status: AC
  Filled 2011-06-11: qty 2000

## 2011-06-11 MED ORDER — LIDOCAINE HCL (PF) 1 % IJ SOLN
INTRAMUSCULAR | Status: AC
  Filled 2011-06-11: qty 30

## 2011-06-11 MED ORDER — MIDAZOLAM HCL 2 MG/2ML IJ SOLN
INTRAMUSCULAR | Status: AC
  Filled 2011-06-11: qty 2

## 2011-06-11 MED ORDER — ZOLPIDEM TARTRATE 5 MG PO TABS
5.0000 mg | ORAL_TABLET | Freq: Once | ORAL | Status: AC
  Administered 2011-06-11: 5 mg via ORAL
  Filled 2011-06-11: qty 1

## 2011-06-11 NOTE — Progress Notes (Signed)
Subjective: Doing better, s/p cath   Objective: Vital signs in last 24 hours: Temp:  [97.1 F (36.2 C)-98.2 F (36.8 C)] 97.7 F (36.5 C) (12/24 1357) Pulse Rate:  [67-85] 77  (12/24 1357) Resp:  [18-19] 18  (12/24 1357) BP: (97-123)/(56-70) 112/65 mmHg (12/24 1357) SpO2:  [94 %-96 %] 94 % (12/24 1357) Weight:  [64.9 kg (143 lb 1.3 oz)] 143 lb 1.3 oz (64.9 kg) (12/24 0508) Weight change:  Last BM Date: 06/09/11  Intake/Output from previous day: 12/23 0701 - 12/24 0700 In: 520 [P.O.:480; I.V.:40] Out: 5040 [Urine:5040] Total I/O In: 363 [P.O.:360; I.V.:3] Out: 300 [Urine:300]   Physical Exam: General: Alert, awake, oriented x3, in no acute distress. HEENT: No bruits, no goiter. Heart: Regular rate and rhythm, without murmurs, rubs, gallops. Lungs: Clear to auscultation bilaterally. Abdomen: Soft, nontender, nondistended, positive bowel sounds, scrotal swelling improved Extremities: 2 plus edema,  Neuro: Grossly intact, nonfocal.  Lab Results: Basic Metabolic Panel:  Basename 06/11/11 1150 06/11/11 0515 06/10/11 1201  NA -- 135 137  K -- 4.1 4.3  CL -- 100 101  CO2 -- 26 26  GLUCOSE -- 95 102*  BUN -- 32* 26*  CREATININE 1.65* 1.67* --  CALCIUM -- 8.1* 8.0*  MG -- -- --  PHOS -- -- --   Liver Function Tests:  Basename 06/09/11 0600  AST 51*  ALT 16  ALKPHOS 121*  BILITOT 3.8*  PROT 6.1  ALBUMIN 2.6*   No results found for this basename: LIPASE:2,AMYLASE:2 in the last 72 hours No results found for this basename: AMMONIA:2 in the last 72 hours CBC:  Basename 06/11/11 1150 06/11/11 0515  WBC 12.6* 12.0*  NEUTROABS -- --  HGB 7.8* 8.2*  HCT 22.0* 22.6*  MCV 84.9 85.3  PLT 106* 124*   Cardiac Enzymes: No results found for this basename: CKTOTAL:3,CKMB:3,CKMBINDEX:3,TROPONINI:3 in the last 72 hours BNP: No components found with this basename: POCBNP:3 D-Dimer: No results found for this basename: DDIMER:2 in the last 72 hours CBG: No results found  for this basename: GLUCAP:6 in the last 72 hours Hemoglobin A1C: No results found for this basename: HGBA1C in the last 72 hours Fasting Lipid Panel: No results found for this basename: CHOL,HDL,LDLCALC,TRIG,CHOLHDL,LDLDIRECT in the last 72 hours Thyroid Function Tests:  Basename 06/09/11 0600 06/08/11 1653  TSH -- --  T4TOTAL -- --  FREET4 0.81 --  T3FREE -- 1.8*  THYROIDAB -- --   Anemia Panel:  Basename 06/09/11 1043  VITAMINB12 410  FOLATE 7.6  FERRITIN 48  TIBC 242  IRON 147*  RETICCTPCT 20.6*   Coagulation: No results found for this basename: LABPROT:2,INR:2 in the last 72 hours Urine Drug Screen: Drugs of Abuse  No results found for this basename: labopia,  cocainscrnur,  labbenz,  amphetmu,  thcu,  labbarb    Alcohol Level: No results found for this basename: ETH:2 in the last 72 hours  Recent Results (from the past 240 hour(s))  URINE CULTURE     Status: Normal   Collection Time   06/07/11  6:58 PM      Component Value Range Status Comment   Specimen Description URINE, CLEAN CATCH   Final    Special Requests NONE   Final    Setup Time [id number]   Final    Colony Count NO GROWTH   Final    Culture NO GROWTH   Final    Report Status 06/08/2011 FINAL   Final     Studies/Results: No results found.  Medications: Scheduled Meds:    . aspirin  324 mg Oral Pre-Cath  . aspirin  324 mg Oral Pre-Cath  . aspirin EC  81 mg Oral Daily  . carvedilol  6.25 mg Oral BID WC  . digoxin  0.125 mg Oral Daily  . fentaNYL      . heparin      . heparin  5,000 Units Subcutaneous Q8H  . lidocaine      . midazolam      . nicotine  14 mg Transdermal Daily  . nitroGLYCERIN      . sodium chloride  3 mL Intravenous Q12H  . sodium chloride  3 mL Intravenous Q12H  . sodium chloride  3 mL Intravenous Q12H  . zolpidem  5 mg Oral Once  . DISCONTD: enoxaparin  40 mg Subcutaneous Q24H  . DISCONTD: furosemide  60 mg Intravenous Q8H  . DISCONTD: lisinopril  10 mg Oral BID    Continuous Infusions:    . sodium chloride 1 mL/kg/hr (06/11/11 1243)  . DISCONTD: sodium chloride 10 mL/hr at 06/11/11 0646  . DISCONTD: sodium chloride     PRN Meds:.sodium chloride, sodium chloride, sodium chloride, acetaminophen, acetaminophen, alum & mag hydroxide-simeth, ondansetron (ZOFRAN) IV, ondansetron, oxyCODONE, senna-docusate, sodium chloride, sodium chloride, sodium chloride, DISCONTD: acetaminophen, DISCONTD: ondansetron (ZOFRAN) IV  Assessment/Plan: 1. Non Ischemic cardiomyopathy 2. Early cardiac cirrhosis 3. HTN (hypertension) 5. ARF/Renal insufficiency with nephrotic range proteinuria 6. Anemia due to chronic disease/Sickle cell disease s/p 2 units PRBC 12/22  Continue management per cards, cath results noted, plan for IVF due to contrast load, hold lisinopril Bmet in am    LOS: 4 days   [facility] Pager: [phone] 06/11/2011, 2:30 PM

## 2011-06-11 NOTE — Progress Notes (Signed)
Subjective:   Continues to diurese briskly. Weight now down 33 pounds since admit. Breathing much better. Edema resolved. No orthopnea or PND. No signs of ETOH withdrawal. Tolerating ACE-I and b-block   Intake/Output Summary (Last 24 hours) at 06/11/11 0830 Last data filed at 06/11/11 [id number]  Gross per 24 hour  Intake    520 ml  Output   4490 ml  Net  -3970 ml    Current meds:    . aspirin  324 mg Oral Pre-Cath  . aspirin  324 mg Oral Pre-Cath  . aspirin EC  81 mg Oral Daily  . carvedilol  6.25 mg Oral BID WC  . digoxin  0.125 mg Oral Daily  . enoxaparin  40 mg Subcutaneous Q24H  . lisinopril  10 mg Oral BID  . nicotine  14 mg Transdermal Daily  . sodium chloride  3 mL Intravenous Q12H  . sodium chloride  3 mL Intravenous Q12H  . sodium chloride  3 mL Intravenous Q12H  . zolpidem  5 mg Oral Once  . DISCONTD: carvedilol  3.125 mg Oral BID WC  . DISCONTD: furosemide  60 mg Intravenous Q8H   Infusions:    . sodium chloride 10 mL/hr at 06/11/11 0646  . sodium chloride       Objective:  Blood pressure 118/70, pulse 81, temperature 98.1 F (36.7 C), temperature source Oral, resp. rate 18, height 5' 10.95" (1.802 m), weight 64.9 kg (143 lb 1.3 oz), SpO2 95.00%. Weight change:   Physical Exam: General: Looks younger than stated age. No respiratory difficulty  HEENT: normal  Neck: supple. JVP flat  Carotids 2+ bilat; no bruits. No lymphadenopathy or thryomegaly appreciated.  Cor: PMI laterally displaced. Regular. Tachy. +s3 2/6 TR  Lungs: basilar crackles  Abdomen: soft, + mild distended. Liver edge down and pulsatile. No bruits or masses. Good bowel sounds.  Extremities: no cyanosis, + clubbing, no rash, no edema + resolving scrotal edema  Neuro: alert & oriented x 3, cranial nerves grossly intact. moves all 4 extremities w/o difficulty. Affect pleasant.   Lab Results: Basic Metabolic Panel:  Lab [id number] 0515 06/10/11 1201 06/10/11 0757 06/09/11 0600 06/08/11 0625  06/07/11 1603  NA 135 137 137 137 136 --  K 4.1 4.3 -- -- -- --  CL 100 101 102 106 108 --  CO2 26 26 26 21 19  --  GLUCOSE 95 102* 114* 94 88 --  BUN 32* 26* 26* 26* 28* --  CREATININE 1.67* 1.45* 1.44* 1.57* 1.63* --  CALCIUM 8.1* 8.0* 8.1* 8.5 8.1* --  MG -- -- -- -- 1.9 2.1  PHOS -- -- -- -- -- --   Liver Function Tests:  Lab 06/09/11 0600 06/07/11 1603  AST 51* 54*  ALT 16 19  ALKPHOS 121* 125*  BILITOT 3.8* 3.8*  PROT 6.1 6.1  ALBUMIN 2.6* 2.7*   No results found for this basename: LIPASE:5,AMYLASE:5 in the last 168 hours No results found for this basename: AMMONIA:5 in the last 168 hours CBC:  Lab 06/11/11 0515 06/10/11 1201 06/09/11 0600 06/08/11 0625 06/07/11 1603  WBC 12.0* 12.2* 14.5* 11.3* 11.1*  NEUTROABS -- -- -- -- 8.6*  HGB 8.2* 8.1* 7.4* 5.6* 6.2*  HCT 22.6* 22.4* 20.0* 15.3* 16.8*  MCV 85.3 85.2 84.7 86.4 87.0  PLT 124* 137* 104* 99* 112*   Cardiac Enzymes:  Lab 06/07/11 2331 06/07/11 1604  CKTOTAL 79 106  CKMB 2.5 2.9  CKMBINDEX -- --  TROPONINI <0.30 <0.30  BNP: No components found with this basename: POCBNP:5 CBG: No results found for this basename: GLUCAP:5 in the last 168 hours Microbiology: Lab Results  Component Value Date   CULT NO GROWTH 06/07/2011    Lab 06/07/11 1858  CULT NO GROWTH  SDES URINE, CLEAN CATCH    Imaging: No results found.   ASSESSMENT:  1) Acute systolic HF with marked volume overload       --EF 30-40% (etiology unclear) 2) Sickle cell anemia  3) Acute respiratory distress, improved  4) Renal failure, stage 3  5) Tobacco use  6) Moderate ETOH use   PLAN/DISCUSSION:  Doing very well. A bit over diureses. Cr up to 1.6 (started 1.8 and got down to 1.4). Have stopped lasix and started gentle IVF. Will plan R & L cath today with minimal contrast. (No LV gram). Discussed risks of cath including renal failure and he is ok to proceed. Probable d/c tomorrow am pending results of cath.   LOS: 4 days  Glori Bickers, MD 06/11/2011, 8:30 AM

## 2011-06-11 NOTE — Procedures (Signed)
   Cardiac Catheterization Procedure Note  Name: Patrick [name] MRN: [id number] DOB: 01/14/1956  Procedure: Right Heart Cath, Left Heart Cath, Selective Coronary Angiography  Indication:    Procedural Details: The right groin was prepped, draped, and anesthetized with 1% lidocaine. Using the modified Seldinger technique a 5 French sheath was placed in the right femoral artery and a 7 French sheath was placed in the right femoral vein. A Swan-Ganz catheter was used for the right heart catheterization. Standard protocol was followed for recording of right heart pressures and sampling of oxygen saturations. Fick cardiac output was calculated. MP catheter was used to engage with LCA and enter the LV.  JR4 catheter was used to engage the RCA. There were no immediate procedural complications. The patient was transferred to the post catheterization recovery area for further monitoring.  Procedural Findings: Hemodynamics (mmHg) RA mean 2 RV 24/3 PA 22/6 PCWP mean 8 LV 96/7 AO 100/64  Oxygen saturations: PA 78% AO 99%  Cardiac Output (Fick) 10.4  Cardiac Index (Fick) 5.7   Coronary angiography: Coronary dominance: right  Left mainstem: No angiographic CAD  Left anterior descending (LAD): No angiographic CAD  Left circumflex (LCx): No angiographic CAD  Right coronary artery (RCA): No angiographic CAD  Left ventriculography: Not done due to CKD.    Contrast: 40 cc  Final Conclusions:  No angiographic CAD.  Normal filling pressures.  LV-gram not done but EF known to be low (nonischemic cardiomyopathy).  High cardiac output likely due to profound chronic anemia with sickle cell disease.  Will gently hydrate, hold Lasix today.   Loralie Champagne 06/11/2011, 10:09 AM

## 2011-06-11 NOTE — H&P (View-Only) (Signed)
Subjective:   Continues to diurese briskly. Weight now down 33 pounds since admit. Breathing much better. Edema resolved. No orthopnea or PND. No signs of ETOH withdrawal. Tolerating ACE-I and b-block   Intake/Output Summary (Last 24 hours) at 06/11/11 0830 Last data filed at 06/11/11 [id number]  Gross per 24 hour  Intake    520 ml  Output   4490 ml  Net  -3970 ml    Current meds:    . aspirin  324 mg Oral Pre-Cath  . aspirin  324 mg Oral Pre-Cath  . aspirin EC  81 mg Oral Daily  . carvedilol  6.25 mg Oral BID WC  . digoxin  0.125 mg Oral Daily  . enoxaparin  40 mg Subcutaneous Q24H  . lisinopril  10 mg Oral BID  . nicotine  14 mg Transdermal Daily  . sodium chloride  3 mL Intravenous Q12H  . sodium chloride  3 mL Intravenous Q12H  . sodium chloride  3 mL Intravenous Q12H  . zolpidem  5 mg Oral Once  . DISCONTD: carvedilol  3.125 mg Oral BID WC  . DISCONTD: furosemide  60 mg Intravenous Q8H   Infusions:    . sodium chloride 10 mL/hr at 06/11/11 0646  . sodium chloride       Objective:  Blood pressure 118/70, pulse 81, temperature 98.1 F (36.7 C), temperature source Oral, resp. rate 18, height 5' 10.95" (1.802 m), weight 64.9 kg (143 lb 1.3 oz), SpO2 95.00%. Weight change:   Physical Exam: General: Looks younger than stated age. No respiratory difficulty  HEENT: normal  Neck: supple. JVP flat  Carotids 2+ bilat; no bruits. No lymphadenopathy or thryomegaly appreciated.  Cor: PMI laterally displaced. Regular. Tachy. +s3 2/6 TR  Lungs: basilar crackles  Abdomen: soft, + mild distended. Liver edge down and pulsatile. No bruits or masses. Good bowel sounds.  Extremities: no cyanosis, + clubbing, no rash, no edema + resolving scrotal edema  Neuro: alert & oriented x 3, cranial nerves grossly intact. moves all 4 extremities w/o difficulty. Affect pleasant.   Lab Results: Basic Metabolic Panel:  Lab [id number] 0515 06/10/11 1201 06/10/11 0757 06/09/11 0600 06/08/11 0625  06/07/11 1603  NA 135 137 137 137 136 --  K 4.1 4.3 -- -- -- --  CL 100 101 102 106 108 --  CO2 26 26 26 21 19  --  GLUCOSE 95 102* 114* 94 88 --  BUN 32* 26* 26* 26* 28* --  CREATININE 1.67* 1.45* 1.44* 1.57* 1.63* --  CALCIUM 8.1* 8.0* 8.1* 8.5 8.1* --  MG -- -- -- -- 1.9 2.1  PHOS -- -- -- -- -- --   Liver Function Tests:  Lab 06/09/11 0600 06/07/11 1603  AST 51* 54*  ALT 16 19  ALKPHOS 121* 125*  BILITOT 3.8* 3.8*  PROT 6.1 6.1  ALBUMIN 2.6* 2.7*   No results found for this basename: LIPASE:5,AMYLASE:5 in the last 168 hours No results found for this basename: AMMONIA:5 in the last 168 hours CBC:  Lab 06/11/11 0515 06/10/11 1201 06/09/11 0600 06/08/11 0625 06/07/11 1603  WBC 12.0* 12.2* 14.5* 11.3* 11.1*  NEUTROABS -- -- -- -- 8.6*  HGB 8.2* 8.1* 7.4* 5.6* 6.2*  HCT 22.6* 22.4* 20.0* 15.3* 16.8*  MCV 85.3 85.2 84.7 86.4 87.0  PLT 124* 137* 104* 99* 112*   Cardiac Enzymes:  Lab 06/07/11 2331 06/07/11 1604  CKTOTAL 79 106  CKMB 2.5 2.9  CKMBINDEX -- --  TROPONINI <0.30 <0.30  BNP: No components found with this basename: POCBNP:5 CBG: No results found for this basename: GLUCAP:5 in the last 168 hours Microbiology: Lab Results  Component Value Date   CULT NO GROWTH 06/07/2011    Lab 06/07/11 1858  CULT NO GROWTH  SDES URINE, CLEAN CATCH    Imaging: No results found.   ASSESSMENT:  1) Acute systolic HF with marked volume overload       --EF 30-40% (etiology unclear) 2) Sickle cell anemia  3) Acute respiratory distress, improved  4) Renal failure, stage 3  5) Tobacco use  6) Moderate ETOH use   PLAN/DISCUSSION:  Doing very well. A bit over diureses. Cr up to 1.6 (started 1.8 and got down to 1.4). Have stopped lasix and started gentle IVF. Will plan R & L cath today with minimal contrast. (No LV gram). Discussed risks of cath including renal failure and he is ok to proceed. Probable d/c tomorrow am pending results of cath.   LOS: 4 days  Glori Bickers, MD 06/11/2011, 8:30 AM

## 2011-06-11 NOTE — Interval H&P Note (Signed)
History and Physical Interval Note:  06/11/2011 9:39 AM  Patrick [name]  has presented today for surgery, with the diagnosis of Chest pain  The various methods of treatment have been discussed with the patient and family. After consideration of risks, benefits and other options for treatment, the patient has consented to  Procedure(s): LEFT AND RIGHT HEART CATHETERIZATION WITH CORONARY ANGIOGRAM as a surgical intervention .  The patients' history has been reviewed, patient examined, no change in status, stable for surgery.  I have reviewed the patients' chart and labs.  Questions were answered to the patient's satisfaction.     Alycia Cooperwood [employer]

## 2011-06-12 DIAGNOSIS — I1310 Hypertensive heart and chronic kidney disease without heart failure, with stage 1 through stage 4 chronic kidney disease, or unspecified chronic kidney disease: Secondary | ICD-10-CM | POA: Diagnosis present

## 2011-06-12 DIAGNOSIS — D638 Anemia in other chronic diseases classified elsewhere: Secondary | ICD-10-CM | POA: Diagnosis present

## 2011-06-12 LAB — BASIC METABOLIC PANEL
BUN: 33 mg/dL — ABNORMAL HIGH (ref 6–23)
BUN: 37 mg/dL — ABNORMAL HIGH (ref 6–23)
CO2: 24 mEq/L (ref 19–32)
CO2: 26 mEq/L (ref 19–32)
Calcium: 7.5 mg/dL — ABNORMAL LOW (ref 8.4–10.5)
Calcium: 7.6 mg/dL — ABNORMAL LOW (ref 8.4–10.5)
Chloride: 100 mEq/L (ref 96–112)
Chloride: 100 mEq/L (ref 96–112)
Creatinine, Ser: 1.43 mg/dL — ABNORMAL HIGH (ref 0.50–1.35)
Creatinine, Ser: 1.55 mg/dL — ABNORMAL HIGH (ref 0.50–1.35)
GFR calc Af Amer: 63 mL/min — ABNORMAL LOW (ref >90)
GFR calc Af Amer: 57 mL/min — ABNORMAL LOW (ref >90)
GFR calc non Af Amer: 55 mL/min — ABNORMAL LOW (ref >90)
GFR calc non Af Amer: 49 mL/min — ABNORMAL LOW (ref >90)
Glucose, Bld: 90 mg/dL (ref 70–99)
Glucose, Bld: 97 mg/dL (ref 70–99)
Potassium: 3.8 mEq/L (ref 3.5–5.1)
Potassium: 4 mEq/L (ref 3.5–5.1)
Sodium: 134 mEq/L — ABNORMAL LOW (ref 135–145)
Sodium: 136 mEq/L (ref 135–145)

## 2011-06-12 LAB — TYPE AND SCREEN
ABO/RH(D): AB POS
Antibody Screen: NEGATIVE
Unit division: 0
Unit division: 0
Unit division: 0

## 2011-06-12 MED ORDER — FUROSEMIDE 40 MG PO TABS: 40.0000 mg | ORAL_TABLET | Freq: Every day | ORAL | Status: DC

## 2011-06-12 MED ORDER — CARVEDILOL 6.25 MG PO TABS: 6.2500 mg | ORAL_TABLET | Freq: Two times a day (BID) | ORAL | Status: DC

## 2011-06-12 MED ORDER — LISINOPRIL 10 MG PO TABS: 10.0000 mg | ORAL_TABLET | Freq: Every day | ORAL | Status: DC

## 2011-06-12 MED ORDER — DIGOXIN 125 MCG PO TABS: 0.1250 mg | ORAL_TABLET | Freq: Every day | ORAL | Status: DC

## 2011-06-12 NOTE — Progress Notes (Signed)
Subjective:   Results of cath yesterday reviewed. Normal cors. Well compensated filling pressures.   Lasix held o/n due to mild worsening of renal function.   Feels good. No SOB/orthopnea or PND. Weights unreliable. No problems with groin site.   Intake/Output Summary (Last 24 hours) at 06/12/11 0703 Last data filed at 06/12/11 0500  Gross per 24 hour  Intake 1016.79 ml  Output   1200 ml  Net -183.21 ml    Current meds:    . aspirin  324 mg Oral Pre-Cath  . aspirin EC  81 mg Oral Daily  . carvedilol  6.25 mg Oral BID WC  . digoxin  0.125 mg Oral Daily  . fentaNYL      . heparin      . heparin  5,000 Units Subcutaneous Q8H  . lidocaine      . midazolam      . nicotine  14 mg Transdermal Daily  . nitroGLYCERIN      . sodium chloride  3 mL Intravenous Q12H  . sodium chloride  3 mL Intravenous Q12H  . sodium chloride  3 mL Intravenous Q12H  . DISCONTD: enoxaparin  40 mg Subcutaneous Q24H  . DISCONTD: furosemide  60 mg Intravenous Q8H  . DISCONTD: lisinopril  10 mg Oral BID   Infusions:    . sodium chloride 1 mL/kg/hr (06/11/11 1243)  . DISCONTD: sodium chloride 10 mL/hr at 06/11/11 0646  . DISCONTD: sodium chloride       Objective:  Blood pressure 114/61, pulse 89, temperature 98.5 F (36.9 C), temperature source Oral, resp. rate 18, height 5' 10.95" (1.802 m), weight 69 kg (152 lb 1.9 oz), SpO2 93.00%. Weight change: 1.8 kg (3 lb 15.5 oz)  Physical Exam: General: Looks younger than stated age. No respiratory difficulty  HEENT: normal  Neck: supple. JVP flat  Carotids 2+ bilat; no bruits. No lymphadenopathy or thryomegaly appreciated.  Cor: PMI laterally displaced. Regular. Tachy. +s3 2/6 TR  Lungs: basilar crackles  Abdomen: soft, nontender Liver edge down and pulsatile. No bruits or masses. Good bowel sounds.  Extremities: no cyanosis, + clubbing, no rash, no edema. Groin site OK Neuro: alert & oriented x 3, cranial nerves grossly intact. moves all 4  extremities w/o difficulty. Affect pleasant.   Lab Results: Basic Metabolic Panel:  Lab [id number] 1150 06/11/11 0515 06/10/11 1201 06/10/11 0757 06/09/11 0600 06/08/11 0625 06/07/11 1603  NA -- 135 137 137 137 136 --  K -- 4.1 4.3 -- -- -- --  CL -- 100 101 102 106 108 --  CO2 -- 26 26 26 21 19  --  GLUCOSE -- 95 102* 114* 94 88 --  BUN -- 32* 26* 26* 26* 28* --  CREATININE 1.65* 1.67* 1.45* 1.44* 1.57* -- --  CALCIUM -- 8.1* 8.0* 8.1* 8.5 8.1* --  MG -- -- -- -- -- 1.9 2.1  PHOS -- -- -- -- -- -- --   Liver Function Tests:  Lab 06/09/11 0600 06/07/11 1603  AST 51* 54*  ALT 16 19  ALKPHOS 121* 125*  BILITOT 3.8* 3.8*  PROT 6.1 6.1  ALBUMIN 2.6* 2.7*   No results found for this basename: LIPASE:5,AMYLASE:5 in the last 168 hours No results found for this basename: AMMONIA:5 in the last 168 hours CBC:  Lab 06/11/11 1150 06/11/11 0515 06/10/11 1201 06/09/11 0600 06/08/11 0625 06/07/11 1603  WBC 12.6* 12.0* 12.2* 14.5* 11.3* --  NEUTROABS -- -- -- -- -- 8.6*  HGB 7.8* 8.2* 8.1*  7.4* 5.6* --  HCT 22.0* 22.6* 22.4* 20.0* 15.3* --  MCV 84.9 85.3 85.2 84.7 86.4 --  PLT 106* 124* 137* 104* 99* --   Cardiac Enzymes:  Lab 06/07/11 2331 06/07/11 1604  CKTOTAL 79 106  CKMB 2.5 2.9  CKMBINDEX -- --  TROPONINI <0.30 <0.30   BNP: No components found with this basename: POCBNP:5 CBG: No results found for this basename: GLUCAP:5 in the last 168 hours Microbiology: Lab Results  Component Value Date   CULT NO GROWTH 06/07/2011    Lab 06/07/11 1858  CULT NO GROWTH  SDES URINE, CLEAN CATCH    Imaging: No results found.   ASSESSMENT:  1) Acute systolic HF with marked volume overload       --EF 30-40% (etiology unclear) 2) Sickle cell anemia  3) Acute respiratory distress, improved  4) Renal failure, stage 3  5) Tobacco use  6) Moderate ETOH use   PLAN/DISCUSSION:  Doing well. Cath results as above. If cr stable or improved can send home today on   Carvedilol  6.25 bid Lisinopril 10 bid Lasix 40 daily (if weight going up increase to bid) Digoxin 0.125 daily  Discussed use of sliding scale lasix.   Will arrange for outpatient f/u early next week in HF clinic. Will call him with appt. His cell # is [phone].  Please give him the phone # of the HF clinic so he can call with problems. [phone].   LOS: 5 days  Glori Bickers, MD 06/12/2011, 7:03 AM

## 2011-06-12 NOTE — Progress Notes (Signed)
1830  Pt prepared for discharge, skin intact, alert and oriented, understands discharge instructions, copies given, Rx sent to his pharmacy an pt aware, telemetry d/c, iv d/c right forearm without problem, escorted via wheelchair to meet his ride for home.  Rosalie Gums RN

## 2011-06-13 LAB — PROTEIN ELECTROPHORESIS, SERUM
Albumin ELP: 48.9 % — ABNORMAL LOW (ref 55.8–66.1)
Alpha-1-Globulin: 7 % — ABNORMAL HIGH (ref 2.9–4.9)
Alpha-2-Globulin: 9.7 % (ref 7.1–11.8)
Beta 2: 7.7 % — ABNORMAL HIGH (ref 3.2–6.5)
Beta Globulin: 5.3 % (ref 4.7–7.2)
Gamma Globulin: 21.4 % — ABNORMAL HIGH (ref 11.1–18.8)
M-Spike, %: NOT DETECTED g/dL
Total Protein ELP: 4.6 g/dL — ABNORMAL LOW (ref 6.0–8.3)

## 2011-06-15 LAB — UIFE/LIGHT CHAINS/TP QN, 24-HR UR
Albumin, U: DETECTED
Alpha 1, Urine: DETECTED — AB
Alpha 2, Urine: DETECTED — AB
Beta, Urine: DETECTED — AB
Free Kappa Lt Chains,Ur: 13.3 mg/dL — ABNORMAL HIGH (ref 0.14–2.42)
Free Kappa/Lambda Ratio: 13.71 ratio — ABNORMAL HIGH (ref 2.04–10.37)
Free Lambda Lt Chains,Ur: 0.97 mg/dL — ABNORMAL HIGH (ref 0.02–0.67)
Gamma Globulin, Urine: DETECTED — AB
Total Protein, Urine: 82.8 mg/dL

## 2011-07-09 NOTE — Discharge Summary (Signed)
Physician Discharge Summary  Patient ID: KOLIN DUBS MRN: [id number] DOB/AGE: [birth year] 56 y.o.  Admit date: 06/07/2011 Discharge date: 07/09/2011  Primary Care Physician:  Elyn Peers, MD, MD  Discharge Diagnoses:   1. Acute on chronic systolic CHF( nonischemic cardiomyopathy EF 30-40%) 2. Anasarca 3. acute on chronic anemia due to sickle cell disease 4. sickle cell disease 5. chronic kidney disease stage III 6. suspected early cardiac cirrhosis on CT 7. HTN (hypertension) 8. Cardiorenal syndrome with renal failure  Discharge Medication List as of 06/12/2011  5:44 PM    START taking these medications   Details  carvedilol (COREG) 6.25 MG tablet Take 1 tablet (6.25 mg total) by mouth 2 (two) times daily with a meal., Starting 06/12/2011, Until Wed 06/11/12, Normal    digoxin (LANOXIN) 0.125 MG tablet Take 1 tablet (0.125 mg total) by mouth daily., Starting 06/12/2011, Until Wed 06/11/12, Normal    furosemide (LASIX) 40 MG tablet Take 1 tablet (40 mg total) by mouth daily., Starting 06/12/2011, Until Wed 06/11/12, Normal    lisinopril (PRINIVIL) 10 MG tablet Take 1 tablet (10 mg total) by mouth daily., Starting 06/12/2011, Until Wed 06/11/12, Normal      STOP taking these medications     Aspirin-Acetaminophen (GOODYS BODY PAIN PO)      ibuprofen (ADVIL,MOTRIN) 200 MG tablet          Disposition and Follow-up:  Dr. Haroldine Laws in 1 weeks at the heart failure clinic  Consults: Dr. Kristeen Mans cardiology  Significant Diagnostic Studies:  X-ray Chest Pa And Lateral   06/07/2011  *RADIOLOGY REPORT*  Clinical Data: Shortness of breath.  CHEST - 2 VIEW  Comparison: No comparison exams available for direct review. Report from 11/24/2002 is available.  Findings: Cardiomegaly.  Pulmonary vascular prominence / congestion most notable centrally.  No segmental infiltrate or pneumothorax.  Mildly tortuous aorta.  IMPRESSION: Cardiomegaly.  Pulmonary vascular  prominence / congestion most notable centrally.  Original Report Authenticated By: Doug Sou, M.D.   Ct Abdomen Pelvis W Contrast 06/08/2011    IMPRESSION: Cardiomegaly.  Trace pericardial effusion with associate opacities; atelectasis versus pneumonia.  No filling defect identified within the veins of the abdomen pelvis.  Lobular liver contour, can be seen with early cirrhotic change.  Autosplenectomy.  Multilevel osteosclerosis without definite acute osseous abnormality.  Cholelithiasis.  Original Report Authenticated By: Suanne Marker, M.D.    Brief H and P: Mr. Markham is a 56 year old African American male with history of sickle cell disease present from his PCPs office with worsening lower extremity edema extending to his abdomen associated with 20 pound weight gain over 3 months, scrotal swelling and shortness of breath, upon evaluation he was found to have acute on chronic anemia and anasarca  Hospital Course:  1.Volume overload with anasarca, and ascites: She had an extensive workup for this, he quickly had an echo done which showed an EF of 30-35% with global hypokinesis, in addition felt to have high cardiac output from profound anemia with sickle cell disease . he was aggressively diuresed with IV Lasix with significant improvement in his anasarca, resolution of the scrotal edema, last about 30 pounds of fluid weight,  Lee underwent a cardiac cath by Dr. Loralie Champagne on 12/24 which showed no angiographic evidence of coronary disease and hence his cardiomyopathy he was nonischemic. He was also started on aspirin Coreg and digoxin along with low-dose ACE inhibitors for afterload reduction. He also and underwent a workup for cirrhosis, due to hypoalbuminemia  and mild coagulopathy, a CT of his abdomen did show possible evidence of early cirrhosis which is felt to be cardiac in etiology. He also had hepatitis serology done which was negative. 2. chronic kidney disease stage III: His  creatinine improved some with aggressive diuresis, also found to have nephrotic range proteinuria which is felt to be secondary to cardiorenal syndrome, will need followup with nephrology as well after discharge. 3. acute on chronic anemia of chronic disease: Anemia panel was consistent with chronic disease, received Q2 units of PRBC on 12/22 hemoglobin has subsequently been stable around 7.8 and 8 g/dL  Patient is discharged home in stable condition to followup at the heart failure clinic in one week with Dr. Haroldine Laws He will need to followup with nephrology in 3-4 weeks as well  Time spent on Discharge: 91min  Signed: Tanya Crothers [facility]  07/09/2011, 1:50 PM

## 2011-07-13 ENCOUNTER — Encounter (HOSPITAL_COMMUNITY)
Admission: RE | Admit: 2011-07-13 | Discharge: 2011-07-13 | Disposition: A | Discharge status: Home / Self Care | Payer: PRIVATE HEALTH INSURANCE | Source: Ambulatory Visit | Attending: Family Medicine | Admitting: Family Medicine

## 2011-07-13 ENCOUNTER — Other Ambulatory Visit (HOSPITAL_COMMUNITY): Payer: Self-pay | Admitting: *Deleted

## 2011-07-13 DIAGNOSIS — D649 Anemia, unspecified: Secondary | ICD-10-CM | POA: Insufficient documentation

## 2011-07-14 ENCOUNTER — Emergency Department (HOSPITAL_COMMUNITY)
Admission: EM | Admit: 2011-07-14 | Discharge: 2011-07-14 | Disposition: A | Discharge status: Home / Self Care | Payer: PRIVATE HEALTH INSURANCE | Attending: Emergency Medicine | Admitting: Emergency Medicine

## 2011-07-14 ENCOUNTER — Encounter (HOSPITAL_COMMUNITY): Payer: Self-pay | Admitting: Emergency Medicine

## 2011-07-14 DIAGNOSIS — D571 Sickle-cell disease without crisis: Secondary | ICD-10-CM

## 2011-07-14 DIAGNOSIS — M79671 Pain in right foot: Secondary | ICD-10-CM

## 2011-07-14 DIAGNOSIS — M7989 Other specified soft tissue disorders: Secondary | ICD-10-CM | POA: Insufficient documentation

## 2011-07-14 DIAGNOSIS — Z79899 Other long term (current) drug therapy: Secondary | ICD-10-CM | POA: Insufficient documentation

## 2011-07-14 DIAGNOSIS — M79609 Pain in unspecified limb: Secondary | ICD-10-CM | POA: Insufficient documentation

## 2011-07-14 LAB — CBC
HCT: 16.4 % — ABNORMAL LOW (ref 39.0–52.0)
Hemoglobin: 6 g/dL — CL (ref 13.0–17.0)
MCH: 31.9 pg (ref 26.0–34.0)
MCHC: 36.6 g/dL — ABNORMAL HIGH (ref 30.0–36.0)
MCV: 87.2 fL (ref 78.0–100.0)
Platelets: 215 10*3/uL (ref 150–400)
RBC: 1.88 MIL/uL — ABNORMAL LOW (ref 4.22–5.81)
RDW: 20.8 % — ABNORMAL HIGH (ref 11.5–15.5)
WBC: 10.8 10*3/uL — ABNORMAL HIGH (ref 4.0–10.5)

## 2011-07-14 MED ORDER — ACETAMINOPHEN 325 MG PO TABS
650.0000 mg | ORAL_TABLET | Freq: Once | ORAL | Status: AC
  Administered 2011-07-14: 650 mg via ORAL
  Filled 2011-07-14: qty 2

## 2011-07-14 NOTE — ED Notes (Signed)
edp campos made aware of hgb 6.0

## 2011-07-14 NOTE — ED Provider Notes (Signed)
11:26 AM Assumed care of patient in the CDU.  Patient discussed with Dr. Venora Maples.  Patient came in today with right foot pain.  Patient's PCP aware of the foot pain and has been treating the pain.  Full ROM of foot.  No erythema or warmth.  Patient also has a Hgb of 6.0 and is currently awaiting blood transfusion of 2 units.  Plan is for patient to be discharged home after the transfusion is complete.  Patient has a history of Sickle Cell Anemia.  Reassessed patient.  Patient in no acute distress.  Patient is requesting pain medications for his foot pain.  Patient does not have any other complaints at this time.  Patient alert and orientated x 3.  Heart RRR.  Lungs CTAB.  Patient denies any CP, SOB, dizziness, lightheadedness, or any other symptoms.  12:45 PM Reassessed patient.  VSS.  Patient in no acute distress.  Patient does not have any complaints at this time.  Patient currently receiving blood transfusion.  1:55 PM Reassessed patient.  VSS.  No acute distress.  No complaints at this time.  Patient currently receiving blood transfusion. 4:23 PM Patient completed blood transfusion of 2 Units.  Patient asymptomatic at this time. Will discharge patient home.  Sherlyn Lees [place], PA-C 07/15/11 [phone]

## 2011-07-14 NOTE — ED Notes (Signed)
Pt advised no adverse reaction noted.  Pt lying on stretcher, alert, watching tv.  Pt aware will be in ED for a few hours while 2 units of PRBC's infuse.

## 2011-07-14 NOTE — ED Notes (Signed)
Per Dr. Venora Maples, waiting to hear from charge nurse if Short Stay is open and available for pt to to go to for transfusion.

## 2011-07-14 NOTE — Discharge Instructions (Signed)
Follow up with your primary care physician for management of your sickle cell anemia and foot pain.  Take medications as prescribed by your primary care physician.   Return to the Emergency Department if symptoms change or worsen, you develop chest pain, shortness of breath, or lightheadedness.

## 2011-07-14 NOTE — ED Notes (Signed)
Both units of prbcs are ready to transfuse per Manuela Schwartz in the blood bank. Sent down for first unit of blood.

## 2011-07-14 NOTE — ED Notes (Signed)
Pt voided understanding to advise RN if begins to develop any type of reaction to blood product, such as chills, feverish, body aches or headache or any other symptoms.

## 2011-07-14 NOTE — ED Notes (Signed)
Pt states was sent by primary MD for blood transfusion - reports hgb as 6.9.  Only complaint is right foot pain/ swelling.

## 2011-07-14 NOTE — ED Notes (Signed)
Diet tray ordered for pt per request.

## 2011-07-14 NOTE — ED Provider Notes (Signed)
History     CSN: [id number]  Arrival date & time 07/14/11  [id number]   First MD Initiated Contact with Patient 07/14/11 [phone]      Chief Complaint  Patient presents with  . Foot Pain    right foot pain/ swelling    (Consider location/radiation/quality/duration/timing/severity/associated sxs/prior treatment) Patient is a 56 y.o. male presenting with lower extremity pain. The history is provided by the patient.  Foot Pain   the patient reports she was instructed to go to the hospital today for blood transfusion.  He reports his hemoglobin is 6.9.  He has received blood transfusions in the past.  He has a history of sickle cell anemia.  He reports he was seen by his physician and he was sent to short stay for his blood transfusion however they're closed today and therefore he was sent to the emergency department.  His only complaint is mild right foot pain and swelling has been present for about 4-5 days which primary care physician is watching and is aware of.  He denies erythema.  He denies fever and chills.  He denies shortness of breath.  He denies chest pain.  He denies melena or hematochezia.  He denies shortness of breath on exertion.  He denies recent trauma to his right foot  Past Medical History  Diagnosis Date  . Shortness of breath   . Anemia     sickle cell anemia  . Headache   . Swelling of extremity, right   . Swelling abdomen   . Swelling of extremity, left     No past surgical history on file.  Family History  Problem Relation Age of Onset  . Alcohol abuse Mother   . Liver disease Mother   . Cirrhosis Mother     History  Substance Use Topics  . Smoking status: Current Everyday Smoker -- 0.5 packs/day for 25 years    Types: Cigarettes  . Smokeless tobacco: Never Used  . Alcohol Use: 4.2 oz/week    7 Cans of beer per week      Review of Systems  All other systems reviewed and are negative.    Allergies  Review of patient's allergies indicates no known  allergies.  Home Medications   Current Outpatient Rx  Name Route Sig Dispense Refill  . CARVEDILOL 6.25 MG PO TABS Oral Take 6.25 mg by mouth 2 (two) times daily with a meal.    . DIGOXIN 0.125 MG PO TABS Oral Take 0.125 mg by mouth daily.    . FUROSEMIDE 40 MG PO TABS Oral Take 40 mg by mouth daily.    Marland Kitchen LISINOPRIL 10 MG PO TABS Oral Take 10 mg by mouth daily.      BP 137/77  Pulse 94  Temp(Src) 97.6 F (36.4 C) (Oral)  Resp 18  Ht 5\' 10"  (1.778 m)  Wt 156 lb (70.761 kg)  BMI 22.38 kg/m2  SpO2 97%  Physical Exam  Nursing note and vitals reviewed. Constitutional: He is oriented to person, place, and time. He appears well-developed and well-nourished.  HENT:  Head: Normocephalic and atraumatic.  Eyes: EOM are normal.  Neck: Normal range of motion.  Cardiovascular: Normal rate, regular rhythm, normal heart sounds and intact distal pulses.   Pulmonary/Chest: Effort normal and breath sounds normal. No respiratory distress.  Abdominal: Soft. He exhibits no distension. There is no tenderness.  Musculoskeletal: Normal range of motion.       Mild pain and swelling of the dorsum of the right  foot without overlying erythema.  There is no warmth this area.  He has normal range of motion of his foot and his ankle on the right.  He has normal PT and DP pulses on the right.  Neurological: He is alert and oriented to person, place, and time.  Skin: Skin is warm and dry.  Psychiatric: He has a normal mood and affect. Judgment normal.    ED Course  Procedures (including critical care time)  Labs Reviewed  CBC - Abnormal; Notable for the following:    WBC 10.8 (*)    RBC 1.88 (*)    Hemoglobin 6.0 (*)    HCT 16.4 (*)    MCHC 36.6 (*) SICKLE CELLS   RDW 20.8 (*)    All other components within normal limits  LAB REPORT - SCANNED   No results found.   1. Sickle cell anemia   2. Foot pain, right       MDM  I believe short stay may be open and will call to determine if he can  receive his blood transfusion if there at this time is without signs of symptomatically anemia however I will not change the plan of his primary care Dr.  Madolyn Frieze his right foot pain does not appear to be infected at this time.  Will workup further as it being followed by his primary care physician.  He has no signs to suggest DVT.  10:28 AM Given hemoglobin of 6 we'll transfuse 2 units of blood at this time.  The patient will undergo his transfusion in the clinical decision unit.  He is stable for discharge after transfusion      Hoy Morn, MD 07/15/11 [phone]

## 2011-07-15 LAB — TYPE AND SCREEN
ABO/RH(D): AB POS
Antibody Screen: NEGATIVE
Unit division: 0
Unit division: 0

## 2011-07-15 NOTE — ED Provider Notes (Signed)
I was the primary provider of this patient during this ER visit. The patients care was continued in the CDU and managed in conjunction with my midlevel providers   Hoy Morn, MD 07/15/11 [phone]

## 2011-08-06 ENCOUNTER — Other Ambulatory Visit (HOSPITAL_COMMUNITY): Payer: Self-pay | Admitting: Internal Medicine

## 2011-10-17 ENCOUNTER — Inpatient Hospital Stay (HOSPITAL_COMMUNITY)
Admission: EM | Admit: 2011-10-17 | Discharge: 2011-10-24 | DRG: 291 | Disposition: A | Discharge status: Home / Self Care | Payer: PRIVATE HEALTH INSURANCE | Attending: Internal Medicine | Admitting: Internal Medicine

## 2011-10-17 ENCOUNTER — Inpatient Hospital Stay (HOSPITAL_COMMUNITY): Payer: PRIVATE HEALTH INSURANCE

## 2011-10-17 ENCOUNTER — Emergency Department (HOSPITAL_COMMUNITY): Payer: PRIVATE HEALTH INSURANCE

## 2011-10-17 ENCOUNTER — Encounter (HOSPITAL_COMMUNITY): Payer: Self-pay | Admitting: Family Medicine

## 2011-10-17 DIAGNOSIS — D72829 Elevated white blood cell count, unspecified: Secondary | ICD-10-CM | POA: Diagnosis present

## 2011-10-17 DIAGNOSIS — I472 Ventricular tachycardia, unspecified: Secondary | ICD-10-CM | POA: Diagnosis not present

## 2011-10-17 DIAGNOSIS — I130 Hypertensive heart and chronic kidney disease with heart failure and stage 1 through stage 4 chronic kidney disease, or unspecified chronic kidney disease: Principal | ICD-10-CM | POA: Diagnosis present

## 2011-10-17 DIAGNOSIS — I5043 Acute on chronic combined systolic (congestive) and diastolic (congestive) heart failure: Secondary | ICD-10-CM

## 2011-10-17 DIAGNOSIS — I509 Heart failure, unspecified: Secondary | ICD-10-CM | POA: Diagnosis present

## 2011-10-17 DIAGNOSIS — E8770 Fluid overload, unspecified: Secondary | ICD-10-CM

## 2011-10-17 DIAGNOSIS — I4729 Other ventricular tachycardia: Secondary | ICD-10-CM

## 2011-10-17 DIAGNOSIS — D7289 Other specified disorders of white blood cells: Secondary | ICD-10-CM

## 2011-10-17 DIAGNOSIS — I517 Cardiomegaly: Secondary | ICD-10-CM

## 2011-10-17 DIAGNOSIS — R0902 Hypoxemia: Secondary | ICD-10-CM | POA: Diagnosis present

## 2011-10-17 DIAGNOSIS — Z7982 Long term (current) use of aspirin: Secondary | ICD-10-CM

## 2011-10-17 DIAGNOSIS — M109 Gout, unspecified: Secondary | ICD-10-CM

## 2011-10-17 DIAGNOSIS — N189 Chronic kidney disease, unspecified: Principal | ICD-10-CM | POA: Diagnosis present

## 2011-10-17 DIAGNOSIS — J9600 Acute respiratory failure, unspecified whether with hypoxia or hypercapnia: Secondary | ICD-10-CM | POA: Diagnosis present

## 2011-10-17 DIAGNOSIS — D638 Anemia in other chronic diseases classified elsewhere: Secondary | ICD-10-CM | POA: Diagnosis present

## 2011-10-17 DIAGNOSIS — N179 Acute kidney failure, unspecified: Secondary | ICD-10-CM | POA: Diagnosis present

## 2011-10-17 DIAGNOSIS — J9601 Acute respiratory failure with hypoxia: Secondary | ICD-10-CM | POA: Diagnosis present

## 2011-10-17 DIAGNOSIS — F17200 Nicotine dependence, unspecified, uncomplicated: Secondary | ICD-10-CM | POA: Diagnosis present

## 2011-10-17 DIAGNOSIS — I5042 Chronic combined systolic (congestive) and diastolic (congestive) heart failure: Secondary | ICD-10-CM | POA: Diagnosis present

## 2011-10-17 DIAGNOSIS — D57419 Sickle-cell thalassemia with crisis, unspecified: Secondary | ICD-10-CM | POA: Diagnosis present

## 2011-10-17 DIAGNOSIS — D5700 Hb-SS disease with crisis, unspecified: Secondary | ICD-10-CM | POA: Diagnosis present

## 2011-10-17 DIAGNOSIS — I5041 Acute combined systolic (congestive) and diastolic (congestive) heart failure: Secondary | ICD-10-CM

## 2011-10-17 DIAGNOSIS — I428 Other cardiomyopathies: Secondary | ICD-10-CM | POA: Diagnosis present

## 2011-10-17 DIAGNOSIS — D57819 Other sickle-cell disorders with crisis, unspecified: Secondary | ICD-10-CM

## 2011-10-17 DIAGNOSIS — I10 Essential (primary) hypertension: Secondary | ICD-10-CM

## 2011-10-17 DIAGNOSIS — Z79899 Other long term (current) drug therapy: Secondary | ICD-10-CM

## 2011-10-17 DIAGNOSIS — IMO0002 Reserved for concepts with insufficient information to code with codable children: Secondary | ICD-10-CM

## 2011-10-17 DIAGNOSIS — R112 Nausea with vomiting, unspecified: Secondary | ICD-10-CM

## 2011-10-17 DIAGNOSIS — N183 Chronic kidney disease, stage 3 unspecified: Secondary | ICD-10-CM

## 2011-10-17 HISTORY — DX: Disease of blood and blood-forming organs, unspecified: D75.9

## 2011-10-17 HISTORY — DX: Heart failure, unspecified: I50.9

## 2011-10-17 HISTORY — DX: Encounter for other specified aftercare: Z51.89

## 2011-10-17 HISTORY — DX: Reserved for inherently not codable concepts without codable children: IMO0001

## 2011-10-17 LAB — COMPREHENSIVE METABOLIC PANEL
ALT: 25 U/L (ref 0–53)
ALT: 25 U/L (ref 0–53)
AST: 65 U/L — ABNORMAL HIGH (ref 0–37)
AST: 63 U/L — ABNORMAL HIGH (ref 0–37)
Albumin: 2.9 g/dL — ABNORMAL LOW (ref 3.5–5.2)
Albumin: 2.9 g/dL — ABNORMAL LOW (ref 3.5–5.2)
Alkaline Phosphatase: 120 U/L — ABNORMAL HIGH (ref 39–117)
Alkaline Phosphatase: 102 U/L (ref 39–117)
BUN: 31 mg/dL — ABNORMAL HIGH (ref 6–23)
BUN: 32 mg/dL — ABNORMAL HIGH (ref 6–23)
CO2: 20 mEq/L (ref 19–32)
CO2: 22 mEq/L (ref 19–32)
Calcium: 8.1 mg/dL — ABNORMAL LOW (ref 8.4–10.5)
Calcium: 8.3 mg/dL — ABNORMAL LOW (ref 8.4–10.5)
Chloride: 108 mEq/L (ref 96–112)
Chloride: 108 mEq/L (ref 96–112)
Creatinine, Ser: 1.45 mg/dL — ABNORMAL HIGH (ref 0.50–1.35)
Creatinine, Ser: 1.47 mg/dL — ABNORMAL HIGH (ref 0.50–1.35)
GFR calc Af Amer: 62 mL/min — ABNORMAL LOW (ref >90)
GFR calc Af Amer: 61 mL/min — ABNORMAL LOW (ref >90)
GFR calc non Af Amer: 54 mL/min — ABNORMAL LOW (ref >90)
GFR calc non Af Amer: 53 mL/min — ABNORMAL LOW (ref >90)
Glucose, Bld: 114 mg/dL — ABNORMAL HIGH (ref 70–99)
Glucose, Bld: 91 mg/dL (ref 70–99)
Potassium: 4.1 mEq/L (ref 3.5–5.1)
Potassium: 4.6 mEq/L (ref 3.5–5.1)
Sodium: 136 mEq/L (ref 135–145)
Sodium: 139 mEq/L (ref 135–145)
Total Bilirubin: 3.3 mg/dL — ABNORMAL HIGH (ref 0.3–1.2)
Total Bilirubin: 3.4 mg/dL — ABNORMAL HIGH (ref 0.3–1.2)
Total Protein: 6.1 g/dL (ref 6.0–8.3)
Total Protein: 6.4 g/dL (ref 6.0–8.3)

## 2011-10-17 LAB — URINALYSIS, ROUTINE W REFLEX MICROSCOPIC
Bilirubin Urine: NEGATIVE
Glucose, UA: NEGATIVE mg/dL
Ketones, ur: NEGATIVE mg/dL
Leukocytes, UA: NEGATIVE
Nitrite: NEGATIVE
Protein, ur: 100 mg/dL — AB
Specific Gravity, Urine: 1.008 (ref 1.005–1.030)
Urobilinogen, UA: 1 mg/dL (ref 0.0–1.0)
pH: 6 (ref 5.0–8.0)

## 2011-10-17 LAB — CBC
HCT: 18.2 % — ABNORMAL LOW (ref 39.0–52.0)
Hemoglobin: 6.7 g/dL — CL (ref 13.0–17.0)
MCH: 33 pg (ref 26.0–34.0)
MCHC: 36.8 g/dL — ABNORMAL HIGH (ref 30.0–36.0)
MCV: 89.7 fL (ref 78.0–100.0)
Platelets: 213 10*3/uL (ref 150–400)
RBC: 2.03 MIL/uL — ABNORMAL LOW (ref 4.22–5.81)
RDW: 26.3 % — ABNORMAL HIGH (ref 11.5–15.5)
WBC: 12.8 10*3/uL — ABNORMAL HIGH (ref 4.0–10.5)

## 2011-10-17 LAB — DIFFERENTIAL
Basophils Absolute: 0.1 10*3/uL (ref 0.0–0.1)
Basophils Relative: 1 % (ref 0–1)
Eosinophils Absolute: 0.5 10*3/uL (ref 0.0–0.7)
Eosinophils Relative: 4 % (ref 0–5)
Lymphocytes Relative: 18 % (ref 12–46)
Lymphs Abs: 2.3 10*3/uL (ref 0.7–4.0)
Monocytes Absolute: 1.7 10*3/uL — ABNORMAL HIGH (ref 0.1–1.0)
Monocytes Relative: 13 % — ABNORMAL HIGH (ref 3–12)
Neutro Abs: 8.2 10*3/uL — ABNORMAL HIGH (ref 1.7–7.7)
Neutrophils Relative %: 64 % (ref 43–77)

## 2011-10-17 LAB — URINE MICROSCOPIC-ADD ON

## 2011-10-17 LAB — PROTIME-INR
INR: 1.29 (ref 0.00–1.49)
Prothrombin Time: 16.3 seconds — ABNORMAL HIGH (ref 11.6–15.2)

## 2011-10-17 LAB — PREPARE RBC (CROSSMATCH)

## 2011-10-17 LAB — CARDIAC PANEL(CRET KIN+CKTOT+MB+TROPI)
CK, MB: 2.2 ng/mL (ref 0.3–4.0)
Relative Index: INVALID (ref 0.0–2.5)
Total CK: 87 U/L (ref 7–232)
Troponin I: <0.3 ng/mL (ref <0.30)

## 2011-10-17 LAB — MAGNESIUM: Magnesium: 2 mg/dL (ref 1.5–2.5)

## 2011-10-17 LAB — APTT: aPTT: 30 seconds (ref 24–37)

## 2011-10-17 LAB — POCT I-STAT TROPONIN I: Troponin i, poc: 0.01 ng/mL (ref 0.00–0.08)

## 2011-10-17 LAB — PATHOLOGIST SMEAR REVIEW

## 2011-10-17 LAB — PRO B NATRIURETIC PEPTIDE: Pro B Natriuretic peptide (BNP): 7276 pg/mL — ABNORMAL HIGH (ref 0–125)

## 2011-10-17 MED ORDER — SODIUM CHLORIDE 0.9 % IJ SOLN
3.0000 mL | Freq: Two times a day (BID) | INTRAMUSCULAR | Status: DC
  Administered 2011-10-17 – 2011-10-20 (×6): 3 mL via INTRAVENOUS

## 2011-10-17 MED ORDER — ENOXAPARIN SODIUM 30 MG/0.3ML ~~LOC~~ SOLN
30.0000 mg | SUBCUTANEOUS | Status: DC
  Administered 2011-10-17 – 2011-10-19 (×3): 30 mg via SUBCUTANEOUS
  Filled 2011-10-17 (×4): qty 0.3

## 2011-10-17 MED ORDER — ONDANSETRON HCL 4 MG/2ML IJ SOLN: 4.0000 mg | Freq: Three times a day (TID) | INTRAMUSCULAR | Status: DC | PRN

## 2011-10-17 MED ORDER — TECHNETIUM TO 99M ALBUMIN AGGREGATED: 3.0000 | Freq: Once | INTRAVENOUS | Status: AC | PRN

## 2011-10-17 MED ORDER — HYDROMORPHONE HCL PF 1 MG/ML IJ SOLN: 0.5000 mg | INTRAMUSCULAR | Status: DC | PRN

## 2011-10-17 MED ORDER — HYDROMORPHONE HCL PF 1 MG/ML IJ SOLN
1.0000 mg | INTRAMUSCULAR | Status: DC | PRN
  Administered 2011-10-17 – 2011-10-18 (×3): 1 mg via INTRAVENOUS
  Filled 2011-10-17 (×3): qty 1

## 2011-10-17 MED ORDER — ASPIRIN EC 81 MG PO TBEC
81.0000 mg | DELAYED_RELEASE_TABLET | Freq: Every day | ORAL | Status: DC
  Administered 2011-10-17 – 2011-10-24 (×8): 81 mg via ORAL
  Filled 2011-10-17 (×8): qty 1

## 2011-10-17 MED ORDER — FUROSEMIDE 10 MG/ML IJ SOLN: 40.0000 mg | Freq: Every day | INTRAMUSCULAR | Status: DC

## 2011-10-17 MED ORDER — FUROSEMIDE 10 MG/ML IJ SOLN
20.0000 mg | Freq: Once | INTRAMUSCULAR | Status: AC
  Administered 2011-10-17: 20 mg via INTRAVENOUS
  Filled 2011-10-17: qty 2

## 2011-10-17 MED ORDER — CARVEDILOL 3.125 MG PO TABS
3.1250 mg | ORAL_TABLET | Freq: Two times a day (BID) | ORAL | Status: DC
  Administered 2011-10-17 – 2011-10-19 (×5): 3.125 mg via ORAL
  Filled 2011-10-17 (×6): qty 1

## 2011-10-17 MED ORDER — SODIUM CHLORIDE 0.9 % IV SOLN: 250.0000 mL | INTRAVENOUS | Status: DC | PRN

## 2011-10-17 MED ORDER — DIGOXIN 125 MCG PO TABS
0.1250 mg | ORAL_TABLET | Freq: Every day | ORAL | Status: DC
  Administered 2011-10-17 – 2011-10-24 (×8): 0.125 mg via ORAL
  Filled 2011-10-17 (×8): qty 1

## 2011-10-17 MED ORDER — BIOTENE DRY MOUTH MT LIQD
15.0000 mL | Freq: Two times a day (BID) | OROMUCOSAL | Status: DC
  Administered 2011-10-17 – 2011-10-24 (×13): 15 mL via OROMUCOSAL

## 2011-10-17 MED ORDER — ONDANSETRON HCL 4 MG/2ML IJ SOLN: 4.0000 mg | Freq: Four times a day (QID) | INTRAMUSCULAR | Status: DC | PRN

## 2011-10-17 MED ORDER — GUAIFENESIN-DM 100-10 MG/5ML PO SYRP
5.0000 mL | ORAL_SOLUTION | ORAL | Status: DC | PRN
  Administered 2011-10-17 – 2011-10-18 (×4): 5 mL via ORAL
  Filled 2011-10-17 (×4): qty 5

## 2011-10-17 MED ORDER — ACETAMINOPHEN 325 MG PO TABS
650.0000 mg | ORAL_TABLET | ORAL | Status: DC | PRN
  Administered 2011-10-18: 650 mg via ORAL
  Filled 2011-10-17: qty 2

## 2011-10-17 MED ORDER — LISINOPRIL 10 MG PO TABS
10.0000 mg | ORAL_TABLET | Freq: Every day | ORAL | Status: DC
  Administered 2011-10-17 – 2011-10-19 (×3): 10 mg via ORAL
  Filled 2011-10-17 (×4): qty 1

## 2011-10-17 MED ORDER — FUROSEMIDE 10 MG/ML IJ SOLN
40.0000 mg | Freq: Two times a day (BID) | INTRAMUSCULAR | Status: DC
  Administered 2011-10-17: 20 mg via INTRAVENOUS
  Administered 2011-10-17 – 2011-10-19 (×5): 40 mg via INTRAVENOUS
  Filled 2011-10-17 (×9): qty 4

## 2011-10-17 MED ORDER — SODIUM CHLORIDE 0.9 % IJ SOLN
3.0000 mL | INTRAMUSCULAR | Status: DC | PRN
  Administered 2011-10-19: 3 mL via INTRAVENOUS

## 2011-10-17 MED ORDER — DIPHENHYDRAMINE HCL 50 MG/ML IJ SOLN: 12.5000 mg | Freq: Three times a day (TID) | INTRAMUSCULAR | Status: DC | PRN

## 2011-10-17 NOTE — ED Provider Notes (Signed)
History     CSN: [id number]  Arrival date & time 10/17/11  0904   First MD Initiated Contact with Patient 10/17/11 [phone]      Chief Complaint  Patient presents with  . Shortness of Breath    (Consider location/radiation/quality/duration/timing/severity/associated sxs/prior treatment) HPI History from patient. 56 year old male with history of sickle cell anemia presents with shortness of breath for the past week. States he was ill with cold-like symptoms and took Mucinex. His cough and congestion improved, but his shortness of breath worsened. He has also noticed swelling in his abdomen and legs. States he was hospitalized for similar a few months ago.  He thinks he was told he had some type of heart condition, but he is unsure exactly what this was. Denies history of renal or liver disease. States he drinks alcohol "occasionally." Denies associated chest pain, diaphoresis, PND, orthopnea, palpitations. He has not had any abdominal pain, nausea, vomiting.   Past Medical History  Diagnosis Date  . Shortness of breath   . Anemia     sickle cell anemia  . Headache   . Swelling of extremity, right   . Swelling abdomen   . Swelling of extremity, left   . Sickle cell anemia   1. Acute on chronic systolic CHF( nonischemic cardiomyopathy EF 30-40%)  2. Anasarca  3. acute on chronic anemia due to sickle cell disease  4. sickle cell disease  5. chronic kidney disease stage III  6. suspected early cardiac cirrhosis on CT  7. HTN (hypertension)  8. Cardiorenal syndrome with renal failure   History reviewed. No pertinent past surgical history.  Family History  Problem Relation Age of Onset  . Alcohol abuse Mother   . Liver disease Mother   . Cirrhosis Mother     History  Substance Use Topics  . Smoking status: Current Everyday Smoker -- 0.5 packs/day for 25 years    Types: Cigarettes  . Smokeless tobacco: Never Used  . Alcohol Use: 4.2 oz/week    7 Cans of beer per week       Review of Systems  Constitutional: Negative for fever, chills and diaphoresis.  HENT: Negative for congestion, sore throat and trouble swallowing.   Respiratory: Positive for shortness of breath. Negative for cough, choking, chest tightness and stridor.   Cardiovascular: Positive for leg swelling. Negative for chest pain and palpitations.  Gastrointestinal: Positive for abdominal distention. Negative for nausea, vomiting and abdominal pain.  Musculoskeletal: Negative for myalgias.  Skin: Negative.   Neurological: Negative for dizziness and weakness.  All other systems reviewed and are negative.    Allergies  Review of patient's allergies indicates no known allergies.  Home Medications   Current Outpatient Rx  Name Route Sig Dispense Refill  . DIGOXIN 0.125 MG PO TABS Oral Take 0.125 mg by mouth daily.    . FUROSEMIDE 40 MG PO TABS Oral Take 40 mg by mouth daily.    Marland Kitchen LISINOPRIL 10 MG PO TABS Oral Take 10 mg by mouth daily.      BP 146/86  Pulse 87  Temp(Src) 98.3 F (36.8 C) (Oral)  Resp 18  SpO2 94%  Physical Exam  Nursing note and vitals reviewed. Constitutional: He appears well-developed and well-nourished. No distress.       Patient initially hypoxic to 87% on room air, improved to 94% on 2 L.  HENT:  Head: Normocephalic and atraumatic.  Right Ear: External ear normal.  Left Ear: External ear normal.  Eyes: EOM are  normal. Pupils are equal, round, and reactive to light.  Neck: Normal range of motion.  Cardiovascular: Normal rate, regular rhythm and normal heart sounds.   Pulmonary/Chest: Effort normal and breath sounds normal.  Abdominal: Soft. Bowel sounds are normal. There is no tenderness.       Abdomen with slight distention consistent with ascites. Hepatomegaly, with liver border ~5cm below costal margin.  Musculoskeletal: Normal range of motion. He exhibits edema.       Bilateral pitting edema, with pitting extending up to the level of the knees.   Neurological: He is alert.  Skin: Skin is warm and dry. No rash noted. He is not diaphoretic.  Psychiatric: He has a normal mood and affect.    ED Course  Procedures (including critical care time)   Date: 10/17/2011  Rate: 86  Rhythm: normal sinus rhythm  QRS Axis: normal  Intervals: borderline AV conduction delay  ST/T Wave abnormalities: normal  Conduction Disutrbances:none  Narrative Interpretation:   Old EKG Reviewed: as compared with 06/10/11, rate increased today, borderline LVH seen on old not seen today  Labs Reviewed  COMPREHENSIVE METABOLIC PANEL - Abnormal; Notable for the following:    Glucose, Bld 114 (*)    BUN 31 (*)    Creatinine, Ser 1.45 (*)    Calcium 8.1 (*)    Albumin 2.9 (*)    AST 65 (*)    Alkaline Phosphatase 120 (*)    Total Bilirubin 3.3 (*)    GFR calc non Af Amer 54 (*)    GFR calc Af Amer 62 (*)    All other components within normal limits  CBC - Abnormal; Notable for the following:    WBC 12.8 (*)    RBC 2.03 (*)    Hemoglobin 6.7 (*)    HCT 18.2 (*)    MCHC 36.8 (*)    RDW 26.3 (*)    All other components within normal limits  DIFFERENTIAL - Abnormal; Notable for the following:    Monocytes Relative 13 (*)    Neutro Abs 8.2 (*)    Monocytes Absolute 1.7 (*)    All other components within normal limits  PROTIME-INR - Abnormal; Notable for the following:    Prothrombin Time 16.3 (*)    All other components within normal limits  PRO B NATRIURETIC PEPTIDE - Abnormal; Notable for the following:    Pro B Natriuretic peptide (BNP) 7276.0 (*)    All other components within normal limits  APTT  POCT I-STAT TROPONIN I  TYPE AND SCREEN  PREPARE RBC (CROSSMATCH)  PATHOLOGIST SMEAR REVIEW  URINALYSIS, ROUTINE W REFLEX MICROSCOPIC   Dg Chest 2 View  10/17/2011  *RADIOLOGY REPORT*  Clinical Data: CHF  CHEST - 2 VIEW  Comparison: 09/05/2010  Findings:  Mild cardiac enlargement is similar to previous exam.  There is no pleural effusion.  Moderate pulmonary venous congestion.  No airspace consolidation is identified.  Review of the visualized osseous structures are unremarkable.  IMPRESSION:  1.  Mild CHF.  Original Report Authenticated By: Angelita Ingles, M.D.   I personally reviewed the plain films.  1. Volume overload   2. Anemia of chronic disease       MDM  Patient with history of CHF, sickle cell anemia presents with shortness of breath. He was initially noted to be hypoxic with an O2 sat of 89% on room air. He improved with placement of nasal cannula. Patient's labs are significant for a hemoglobin of 6.7. He has  had values as low as 6.0 in the past, and has had transfusions on several occasions. 2 units of packed red cells ordered. Labs additionally significant for elevated proBNP as compared with previous. Chest x-ray, which I personally reviewed, with evidence for mild fluid overload. I discussed with Dr. Charlies Silvers with hospitalist service at 1 PM, who agrees to admit the patient. Patient agreeable.       Abran Richard, [place] 10/17/11 1322

## 2011-10-17 NOTE — ED Notes (Signed)
Pt sts cold last week and taking medicine for congestion. sts swelling in abdomen due to fluid retention. Increasing SOB

## 2011-10-17 NOTE — Progress Notes (Signed)
Cosign for Suzette Battiest RN assessment, med admin, notes, and I/O

## 2011-10-17 NOTE — ED Notes (Signed)
Pt received to POD 9 with c/o SOB that got progressively worse with non- productive cough. Pt denies any fever. Pt is attached to the cardiac monitor. Pt is A/A/O4, skin is warm and dry, respiration is even and unlabored.

## 2011-10-17 NOTE — ED Notes (Signed)
Pt was escorted to Nuclear Medicine for his VQ scan then he will be transported to [id number]

## 2011-10-17 NOTE — Progress Notes (Signed)
Pt. Admitted to room 4740 from ED. Alert and oriented. Patient placed on telemetry, assessed, VS taken. First unit of blood infusing @80  mL/hr. Increased to 143mL/hr. No acute distress noted at this time. Patient advised to call staff when needing assistance. Call bell within reach. Will continue to monitor. Silvana Holecek, Katherine Roan

## 2011-10-17 NOTE — Progress Notes (Signed)
Utilization review completed.  

## 2011-10-17 NOTE — ED Notes (Signed)
Pt continues to cough with whitish expectorate.

## 2011-10-17 NOTE — ED Notes (Signed)
First unit of PRBC with unit number [id number] started, infusing to RFA IV site, no redness nor swelling noted. Will monitor

## 2011-10-17 NOTE — Progress Notes (Signed)
Labs ordered. Blood currently infusing. MD notified. Labs changed to 2300. Daquan Crapps, Katherine Roan

## 2011-10-17 NOTE — H&P (Signed)
PCP:  Elyn Peers, MD, MD   DOA:  10/17/2011  9:20 AM  Chief Complaint:  Shortness of breath  HPI: 56 year old male with multiple comorbidities including but not limited to systolic and diastolic congestive heart, hypertension, sickle cell disease who presented to ED with complaints of worsening shortness of breath for one week prior to the admission. Patient denied associated cough, chest pain, fever or chills. No complaints of abdominal pain, nausea or vomiting. No complaints of palpitations, no lightheadedness, no dizziness or loss of consciousness. In ED patient was found to be hypoxic around 87% which quickly improved to low 90s with 2 L nasal cannula.  Allergies: No Known Allergies  Prior to Admission medications   Medication Sig Start Date End Date Taking? Authorizing Provider  digoxin (LANOXIN) 0.125 MG tablet Take 0.125 mg by mouth daily. 06/12/11 06/11/12 Yes Domenic Polite, MD  furosemide (LASIX) 40 MG tablet Take 40 mg by mouth daily. 06/12/11 06/11/12 Yes Domenic Polite, MD  lisinopril (PRINIVIL,ZESTRIL) 10 MG tablet Take 10 mg by mouth daily. 06/12/11 06/11/12 Yes Domenic Polite, MD    Past Medical History  Diagnosis Date  . Shortness of breath   . Anemia     sickle cell anemia  . Headache   . Swelling of extremity, right   . Swelling abdomen   . Swelling of extremity, left   . Sickle cell anemia     History reviewed. No pertinent past surgical history.  Social History:  reports that he has been smoking Cigarettes.  He has a 12.5 pack-year smoking history. He has never used smokeless tobacco. He reports that he drinks about 4.2 ounces of alcohol per week. He reports that he does not use illicit drugs.  Family History  Problem Relation Age of Onset  . Alcohol abuse Mother   . Liver disease Mother   . Cirrhosis Mother     Review of Systems:  Constitutional: Denies fever, chills, diaphoresis, appetite change and fatigue.  HEENT: Denies photophobia, eye pain,  redness, hearing loss, ear pain, congestion, sore throat, rhinorrhea, sneezing, mouth sores, trouble swallowing, neck pain, neck stiffness and tinnitus.   Respiratory: per HPI   Cardiovascular: Denies chest pain, palpitations and leg swelling.  Gastrointestinal: Denies nausea, vomiting, abdominal pain, diarrhea, constipation, blood in stool and abdominal distention.  Genitourinary: Denies dysuria, urgency, frequency, hematuria, flank pain and difficulty urinating.  Musculoskeletal: Denies myalgias, back pain, joint swelling, arthralgias and gait problem.  Skin: Denies pallor, rash and wound.  Neurological: Denies dizziness, seizures, syncope, weakness, light-headedness, numbness and headaches.  Hematological: Denies adenopathy. Easy bruising, personal or family bleeding history  Psychiatric/Behavioral: Denies suicidal ideation, mood changes, confusion, nervousness, sleep disturbance and agitation   Physical Exam:  Filed Vitals:   10/17/11 0934 10/17/11 0959 10/17/11 1114 10/17/11 1231  BP:  146/86 150/86 133/80  Pulse:  87 85 87  Temp:    98.5 F (36.9 C)  TempSrc:      Resp:  18 16 18   SpO2: 94% 94% 93%     Constitutional: Vital signs reviewed.  Patient is in no acute distress and cooperative with exam. Alert and oriented x3.  Head: Normocephalic and atraumatic Ear: TM normal bilaterally Mouth: no erythema or exudates, MMM Eyes: PERRL, EOMI, conjunctivae normal, No scleral icterus.  Neck: Supple, Trachea midline normal ROM, No JVD, mass, thyromegaly, or carotid bruit present.  Cardiovascular: RRR, S1 normal, S2 normal, no MRG, pulses symmetric and intact bilaterally Pulmonary/Chest: CTAB, no wheezes, rales, or rhonchi Abdominal: Soft. Non-tender,  non-distended, bowel sounds are normal, no masses, organomegaly, or guarding present.  GU: no CVA tenderness Musculoskeletal: No joint deformities, erythema, or stiffness, ROM full and no nontender Ext: no edema and no cyanosis, pulses  palpable bilaterally (DP and PT) Hematology: no cervical, inginal, or axillary adenopathy.  Neurological: A&O x3, Strenght is normal and symmetric bilaterally, cranial nerve II-XII are grossly intact, no focal motor deficit, sensory intact to light touch bilaterally.  Skin: Warm, dry and intact. No rash, cyanosis, or clubbing.  Psychiatric: Normal mood and affect. speech and behavior is normal. Judgment and thought content normal. Cognition and memory are normal.   Labs on Admission:  Results for orders placed during the hospital encounter of 10/17/11 (from the past 48 hour(s))  COMPREHENSIVE METABOLIC PANEL     Status: Abnormal   Collection Time   10/17/11  9:47 AM      Component Value Range Comment   Sodium 136  135 - 145 (mEq/L)    Potassium 4.1  3.5 - 5.1 (mEq/L)    Chloride 108  96 - 112 (mEq/L)    CO2 20  19 - 32 (mEq/L)    Glucose, Bld 114 (*) 70 - 99 (mg/dL)    BUN 31 (*) 6 - 23 (mg/dL)    Creatinine, Ser 1.45 (*) 0.50 - 1.35 (mg/dL)    Calcium 8.1 (*) 8.4 - 10.5 (mg/dL)    Total Protein 6.1  6.0 - 8.3 (g/dL)    Albumin 2.9 (*) 3.5 - 5.2 (g/dL)    AST 65 (*) 0 - 37 (U/L)    ALT 25  0 - 53 (U/L)    Alkaline Phosphatase 120 (*) 39 - 117 (U/L)    Total Bilirubin 3.3 (*) 0.3 - 1.2 (mg/dL)    GFR calc non Af Amer 54 (*) >90 (mL/min)    GFR calc Af Amer 62 (*) >90 (mL/min)   CBC     Status: Abnormal   Collection Time   10/17/11  9:47 AM      Component Value Range Comment   WBC 12.8 (*) 4.0 - 10.5 (K/uL)    RBC 2.03 (*) 4.22 - 5.81 (MIL/uL)    Hemoglobin 6.7 (*) 13.0 - 17.0 (g/dL)    HCT 18.2 (*) 39.0 - 52.0 (%)    MCV 89.7  78.0 - 100.0 (fL)    MCH 33.0  26.0 - 34.0 (pg)    MCHC 36.8 (*) 30.0 - 36.0 (g/dL)    RDW 26.3 (*) 11.5 - 15.5 (%)    Platelets 213  150 - 400 (K/uL)   DIFFERENTIAL     Status: Abnormal   Collection Time   10/17/11  9:47 AM      Component Value Range Comment   Neutrophils Relative 64  43 - 77 (%)    Lymphocytes Relative 18  12 - 46 (%)    Monocytes  Relative 13 (*) 3 - 12 (%)    Eosinophils Relative 4  0 - 5 (%)    Basophils Relative 1  0 - 1 (%)    Neutro Abs 8.2 (*) 1.7 - 7.7 (K/uL)    Lymphs Abs 2.3  0.7 - 4.0 (K/uL)    Monocytes Absolute 1.7 (*) 0.1 - 1.0 (K/uL)    Eosinophils Absolute 0.5  0.0 - 0.7 (K/uL)    Basophils Absolute 0.1  0.0 - 0.1 (K/uL)    RBC Morphology MARKED POLYCHROMASIA      WBC Morphology ATYPICAL LYMPHOCYTES   SMUDGE CELLS  Smear Review LARGE PLATELETS PRESENT     PROTIME-INR     Status: Abnormal   Collection Time   10/17/11  9:47 AM      Component Value Range Comment   Prothrombin Time 16.3 (*) 11.6 - 15.2 (seconds)    INR 1.29  0.00 - 1.49    APTT     Status: Normal   Collection Time   10/17/11  9:47 AM      Component Value Range Comment   aPTT 30  24 - 37 (seconds)   PATHOLOGIST SMEAR REVIEW     Status: Normal   Collection Time   10/17/11  9:47 AM      Component Value Range Comment   Tech Review MARKED ANEMIA WITH SICKLING     POCT I-STAT TROPONIN I     Status: Normal   Collection Time   10/17/11  9:59 AM      Component Value Range Comment   Troponin i, poc 0.01  0.00 - 0.08 (ng/mL)    Comment 3            TYPE AND SCREEN     Status: Normal (Preliminary result)   Collection Time   10/17/11 10:55 AM      Component Value Range Comment   ABO/RH(D) AB POS      Antibody Screen NEG      Sample Expiration 10/20/2011      Unit Number [id number]      Blood Component Type RED CELLS,LR      Unit division 00      Status of Unit ISSUED      Transfusion Status OK TO TRANSFUSE      Crossmatch Result Compatible      Donor AG Type        Value: NEGATIVE FOR C ANTIGEN NEGATIVE FOR E ANTIGEN NEGATIVE FOR KELL ANTIGEN   Unit Number [id number]      Blood Component Type RED CELLS,LR      Unit division 00      Status of Unit ALLOCATED      Transfusion Status OK TO TRANSFUSE      Crossmatch Result Compatible      Donor AG Type        Value: NEGATIVE FOR C ANTIGEN NEGATIVE FOR E ANTIGEN NEGATIVE FOR KELL ANTIGEN    PREPARE RBC (CROSSMATCH)     Status: Normal   Collection Time   10/17/11 10:55 AM      Component Value Range Comment   Order Confirmation ORDER PROCESSED BY BLOOD BANK     PRO B NATRIURETIC PEPTIDE     Status: Abnormal   Collection Time   10/17/11 11:49 AM      Component Value Range Comment   Pro B Natriuretic peptide (BNP) 7276.0 (*) 0 - 125 (pg/mL)     Radiological Exams on Admission: CXR: mild CHF  Assessment/Plan  Principal Problem:   *Acute on chronic combined systolic and diastolic CHF (congestive heart failure) - EF 40% in 05/2011 - CHF admission orders in place - lasix 40 mg twice daily IV - daily weight - strict I&Os - correct electrolyte imbalances - start low dose coreg 3.125 mg BID and will assess how patient tolerates it; on med rec no signs of patient being on beta blockers - check lipid panel - check TSH   Active Problems:   HTN (hypertension) - the patient was on lisinopril for some time and CKD appears to be at baseline - continue lisinopril  Anemia of chronic disease - Hgb 6.7 on admission - patient to receive 2 units PRBC blood transfusion - follow up CBC post transfusion    Sickle cell crisis - continue dilaudid for pain control, IV PRN every 4 hours - benadryl for itching Q 8 hours IV PRN - zofran for nausea PRN - cycle cardiac enzymes - follow up 2 D ECHO   Leukocytosis - mild - no clear source of infection as patient is afebrile with no evidence of pneumonia on CXR - follow up urinalysis, urine culture and blood cultures results - defer antibiotic treatment for now until the results of above are available   CKD (chronic kidney disease), stage III - creatinine appears to be at baseline - in the setting of CHF we will continue lasix and it actually may improve kidney function - follow up BMP in am   Acute respiratory failure with hypoxia - perhaps secondary to CHF exacerbation or anemia - we will obtain NM perfusion scan to rule out  pulmonary embolism - O2 saturation improved with 2 L nasal canula  DVT Prophylaxis - lovenox subQ  Code Status - full code  Education  - test results and diagnostic studies were discussed with patient  - patient  verbalized the understanding - questions were answered at the bedside and contact information was provided for additional questions or concerns  Time Spent on Admission: Over 30 minutes  Lydian Chavous 10/17/2011, 12:46 PM  [facility] Pager # [phone] Main Office # [phone]

## 2011-10-17 NOTE — Plan of Care (Signed)
Problem: Phase I Progression Outcomes Goal: Other Phase I Outcomes/Goals Outcome: Completed/Met Date Met:  10/17/11 EF 30-35%

## 2011-10-17 NOTE — ED Provider Notes (Signed)
Medical screening examination/treatment/procedure(s) were conducted as a shared visit with non-physician practitioner(s) and myself.  I personally evaluated the patient during the encounter  Pt with worsening SOB and mild hypoxia on arrival.  89% on arrival.   Pt has rales and edema on exam.  Anemia present at 50most likely from sickle cell  Blanchie Dessert, MD 10/17/11 2033

## 2011-10-18 DIAGNOSIS — D57819 Other sickle-cell disorders with crisis, unspecified: Secondary | ICD-10-CM

## 2011-10-18 DIAGNOSIS — D7289 Other specified disorders of white blood cells: Secondary | ICD-10-CM

## 2011-10-18 DIAGNOSIS — I5041 Acute combined systolic (congestive) and diastolic (congestive) heart failure: Secondary | ICD-10-CM

## 2011-10-18 DIAGNOSIS — R112 Nausea with vomiting, unspecified: Secondary | ICD-10-CM

## 2011-10-18 LAB — CARDIAC PANEL(CRET KIN+CKTOT+MB+TROPI)
CK, MB: 2 ng/mL (ref 0.3–4.0)
CK, MB: 1.7 ng/mL (ref 0.3–4.0)
Relative Index: INVALID (ref 0.0–2.5)
Relative Index: INVALID (ref 0.0–2.5)
Total CK: 59 U/L (ref 7–232)
Total CK: 51 U/L (ref 7–232)
Troponin I: <0.3 ng/mL (ref <0.30)
Troponin I: <0.3 ng/mL (ref <0.30)

## 2011-10-18 LAB — DIFFERENTIAL
Basophils Absolute: 0.1 10*3/uL (ref 0.0–0.1)
Basophils Relative: 1 % (ref 0–1)
Eosinophils Absolute: 0.6 10*3/uL (ref 0.0–0.7)
Eosinophils Relative: 5 % (ref 0–5)
Lymphocytes Relative: 14 % (ref 12–46)
Lymphs Abs: 1.6 10*3/uL (ref 0.7–4.0)
Monocytes Absolute: 1.7 10*3/uL — ABNORMAL HIGH (ref 0.1–1.0)
Monocytes Relative: 15 % — ABNORMAL HIGH (ref 3–12)
Neutro Abs: 7.6 10*3/uL (ref 1.7–7.7)
Neutrophils Relative %: 65 % (ref 43–77)

## 2011-10-18 LAB — BASIC METABOLIC PANEL
BUN: 30 mg/dL — ABNORMAL HIGH (ref 6–23)
CO2: 21 mEq/L (ref 19–32)
Calcium: 8 mg/dL — ABNORMAL LOW (ref 8.4–10.5)
Chloride: 109 mEq/L (ref 96–112)
Creatinine, Ser: 1.34 mg/dL (ref 0.50–1.35)
GFR calc Af Amer: 68 mL/min — ABNORMAL LOW (ref >90)
GFR calc non Af Amer: 59 mL/min — ABNORMAL LOW (ref >90)
Glucose, Bld: 83 mg/dL (ref 70–99)
Potassium: 4.3 mEq/L (ref 3.5–5.1)
Sodium: 140 mEq/L (ref 135–145)

## 2011-10-18 LAB — TYPE AND SCREEN
ABO/RH(D): AB POS
Antibody Screen: NEGATIVE
Unit division: 0
Unit division: 0

## 2011-10-18 LAB — CBC
HCT: 22.9 % — ABNORMAL LOW (ref 39.0–52.0)
Hemoglobin: 8.3 g/dL — ABNORMAL LOW (ref 13.0–17.0)
MCH: 32.8 pg (ref 26.0–34.0)
MCHC: 36.2 g/dL — ABNORMAL HIGH (ref 30.0–36.0)
MCV: 90.5 fL (ref 78.0–100.0)
Platelets: 186 10*3/uL (ref 150–400)
RBC: 2.53 MIL/uL — ABNORMAL LOW (ref 4.22–5.81)
RDW: 22.4 % — ABNORMAL HIGH (ref 11.5–15.5)
WBC: 11.6 10*3/uL — ABNORMAL HIGH (ref 4.0–10.5)

## 2011-10-18 LAB — TSH: TSH: 2.96 u[IU]/mL (ref 0.350–4.500)

## 2011-10-18 MED ORDER — HYDROMORPHONE HCL PF 1 MG/ML IJ SOLN
2.0000 mg | INTRAMUSCULAR | Status: DC | PRN
  Administered 2011-10-18 – 2011-10-20 (×6): 2 mg via INTRAVENOUS
  Filled 2011-10-18 (×2): qty 2
  Filled 2011-10-18: qty 1
  Filled 2011-10-18 (×4): qty 2

## 2011-10-18 NOTE — Consult Note (Signed)
Pt smokes 10 or less cigarettes per day and is interested in quitting with patches. Recommended 14 mg patch to start with. Discussed patch use directions and how to taper down. He voices understanding. Referred to 1-800 quit now for f/u and support. Discussed oral fixation substitutes, second hand smoke and in home smoking policy. Reviewed and gave pt Written education/contact information.

## 2011-10-18 NOTE — Care Management Note (Signed)
    Page 1 of 1   10/23/2011     2:21:23 PM   CARE MANAGEMENT NOTE 10/23/2011  Patient:  Patrick [name], Patrick [name]   Account Number:  [id number]  Date Initiated:  10/18/2011  Documentation initiated by:  Lars Pinks  Subjective/Objective Assessment:   PT WAS ADMITTED WITH HTN AND SOB     Action/Plan:   PROGRESSION OF CARE AND DISCHARGE PLANNING   Anticipated DC Date:  10/20/2011   Anticipated DC Plan:  [place]  CM consult      Choice offered to / List presented to:             Status of service:  In process, will continue to follow Medicare Important Message given?   (If response is "NO", the following Medicare IM given date fields will be blank) Date Medicare IM given:   Date Additional Medicare IM given:    Discharge Disposition:    Per UR Regulation:  Reviewed for med. necessity/level of care/duration of stay  If discussed at [place] of Stay Meetings, dates discussed:    Comments:  10/23/11 Lars Pinks, RN, BSN [place] SEE THE PT AS HE IS [place] NO [place]  10/22/11 Lars Pinks, RN, BSN 1644 PT HAS LOW HGB TODAY AND SICKLE CELL TEAM CONSULTED.  WILL F/U ON PT/OT EVAL AND DC NEEDS.  10/22/2011 [place], CCM Utilization review completed.  10/18/11 Lars Pinks, RN, BSN 72 PT WAS ADMITTED WITH HTN AND SOB. PT IS FROM HOME PTA AND PLANS TO RETURN AT DC.  WILL F/U ON OTHER DC NEEDS.

## 2011-10-18 NOTE — Progress Notes (Signed)
PT Cancellation Note  Treatment cancelled today due to patient's refusal to participate.  The patient is in too much pain in his left arm right now.  He would like to wait until tomorrow to work with PT.  RN called via call bell to ask for pain meds.  PT to check back tomorrow.    Barbarann Ehlers [place], [place], DPT [phone] 10/18/2011, 4:57 PM

## 2011-10-18 NOTE — Progress Notes (Signed)
Patient ID: Patrick [name], male   DOB: 1955/10/25, 56 y.o.   MRN: [id number]  Assessment/Plan   Principal Problem:   *Acute on chronic combined systolic and diastolic CHF (congestive heart failure)  - EF 40% in 05/2011  - lasix 40 mg twice daily IV  - daily weight  - strict I&Os; ~ 500 cc urine output in last 24 hours - started low dose coreg 3.125 mg BID  - check lipid panel  - check TSH   Active Problems:   HTN (hypertension)  - the patient was on lisinopril for some time and CKD appears to be at baseline  - continue lisinopril  - BP today 139/75  Anemia of chronic disease  - Hgb 6.7 on admission  - patient  received 2 units PRBC blood transfusion  - follow up CBC post transfusion 8.3  Sickle cell crisis  - continue dilaudid for pain control, IV PRN every 4 hours  - benadryl for itching Q 8 hours IV PRN  - zofran for nausea PRN  - cycle cardiac enzymes - 2 sets to date are negative - follow up 2 D ECHO   Leukocytosis  - mild, improving  - no clear source of infection as patient is afebrile with no evidence of pneumonia on CXR  - follow up urine culture and blood cultures results  - urinalysis is normal - defer antibiotic treatment for now until the results of above are available   CKD (chronic kidney disease), stage III  - creatinine appears to be at baseline  - in the setting of CHF we will continue lasix and it actually may improve kidney function which it did and creatinine is within normal limits today  Acute respiratory failure with hypoxia  - perhaps secondary to CHF exacerbation or anemia  - V/Q scan shows low probability for pulmonary embolism - O2 saturation improved with 2 L nasal canula; O2 saturation above 97% ion last 12 hours  DVT Prophylaxis - lovenox subQ   Code Status - full code   Education  - test results and diagnostic studies were discussed with patient  - patient verbalized the understanding  - questions were answered at the bedside  and contact information was provided for additional questions or concerns   Disposition - anticipate D/C in 1 -2 days  Subjective: No events overnight. Patient denies chest pain, shortness of breath, abdominal pain.   Objective:  Vital signs in last 24 hours:  Filed Vitals:   10/17/11 2038 10/18/11 0145 10/18/11 0609 10/18/11 1044  BP: 132/77 146/80 140/76 139/75  Pulse: 78 88 78 83  Temp: 97.1 F (36.2 C) 98.5 F (36.9 C) 99.1 F (37.3 C)   TempSrc: Oral Oral Oral   Resp: 18 18 18 18   Height:      Weight:   69.355 kg (152 lb 14.4 oz)   SpO2: 97% 98% 98% 97%    Intake/Output from previous day:  Intake/Output Summary (Last 24 hours) at 10/18/11 1113 Last data filed at 10/18/11 1046  Gross per 24 hour  Intake 1526.5 ml  Output   6500 ml  Net -4973.5 ml    Physical Exam: General: Alert, awake, oriented x3, in no acute distress. HEENT: No bruits, no goiter. Moist mucous membranes, no scleral icterus, no conjunctival pallor. Heart: Regular rate and rhythm, S1/S2 +, no murmurs, rubs, gallops. Lungs: Clear to auscultation bilaterally. No wheezing, no rhonchi, no rales.  Abdomen: Soft, nontender, nondistended, positive bowel sounds. Extremities: No clubbing or cyanosis, (+  1)  LE pitting edema,  positive pedal pulses. Neuro: Grossly nonfocal.  Lab Results:  Lab 11-16-2011 2253 11-16-2011 0947  WBC 11.6* 12.8*  HGB 8.3* 6.7*  HCT 22.9* 18.2*  PLT 186 213  MCV 90.5 89.7    Lab 10/18/11 0645 2011/11/16 2253 11/16/2011 0947  NA 140 139 136  K 4.3 4.6 4.1  CL 109 108 108  CO2 21 22 20   GLUCOSE 83 91 114*  BUN 30* 32* 31*  CREATININE 1.34 1.47* 1.45*  CALCIUM 8.0* 8.3* 8.1*    Lab 16-Nov-2011 0947  INR 1.29   Cardiac markers:  Lab 10/18/11 0645 11-16-2011 2254  CKMB 2.0 2.2  TROPONINI <0.30 <0.30    Studies/Results: Dg Chest 2 View 11-16-11    IMPRESSION:  1.  Mild CHF.    Nm Pulmonary Perfusion 11/16/11 IMPRESSION: No moderate or large segmental perfusion defect  in either lung. Study is low probability for pulmonary embolus.     Medications: Scheduled Meds:   . antiseptic oral rinse  15 mL Mouth Rinse BID  . aspirin EC  81 mg Oral Daily  . carvedilol  3.125 mg Oral BID WC  . digoxin  0.125 mg Oral Daily  . enoxaparin  30 mg Subcutaneous Q24H  . furosemide  40 mg Intravenous Q12H  . lisinopril  10 mg Oral Daily  . sodium chloride  3 mL Intravenous Q12H   Continuous Infusions:  PRN Meds:.sodium chloride, acetaminophen, diphenhydrAMINE, guaiFENesin-dextromethorphan, HYDROmorphone (DILAUDID) injection, ondansetron (ZOFRAN) IV, ondansetron, sodium chloride, technetium albumin aggregated, DISCONTD:  HYDROmorphone (DILAUDID) injection, DISCONTD:  HYDROmorphone (DILAUDID) injection, DISCONTD: ondansetron (ZOFRAN) IV    LOS: 1 day   [name] 10/18/2011, 11:13 AM  [facility] Pager: [phone]

## 2011-10-19 DIAGNOSIS — M79609 Pain in unspecified limb: Secondary | ICD-10-CM

## 2011-10-19 DIAGNOSIS — R112 Nausea with vomiting, unspecified: Secondary | ICD-10-CM

## 2011-10-19 DIAGNOSIS — I5041 Acute combined systolic (congestive) and diastolic (congestive) heart failure: Secondary | ICD-10-CM

## 2011-10-19 DIAGNOSIS — M109 Gout, unspecified: Secondary | ICD-10-CM

## 2011-10-19 DIAGNOSIS — I5023 Acute on chronic systolic (congestive) heart failure: Secondary | ICD-10-CM

## 2011-10-19 DIAGNOSIS — D7289 Other specified disorders of white blood cells: Secondary | ICD-10-CM

## 2011-10-19 DIAGNOSIS — D57819 Other sickle-cell disorders with crisis, unspecified: Secondary | ICD-10-CM

## 2011-10-19 LAB — CBC
HCT: 24.2 % — ABNORMAL LOW (ref 39.0–52.0)
Hemoglobin: 8.8 g/dL — ABNORMAL LOW (ref 13.0–17.0)
MCH: 32.5 pg (ref 26.0–34.0)
MCHC: 36.4 g/dL — ABNORMAL HIGH (ref 30.0–36.0)
MCV: 89.3 fL (ref 78.0–100.0)
Platelets: 191 10*3/uL (ref 150–400)
RBC: 2.71 MIL/uL — ABNORMAL LOW (ref 4.22–5.81)
RDW: 20.6 % — ABNORMAL HIGH (ref 11.5–15.5)
WBC: 13.2 10*3/uL — ABNORMAL HIGH (ref 4.0–10.5)

## 2011-10-19 LAB — BASIC METABOLIC PANEL
BUN: 29 mg/dL — ABNORMAL HIGH (ref 6–23)
CO2: 22 mEq/L (ref 19–32)
Calcium: 8.1 mg/dL — ABNORMAL LOW (ref 8.4–10.5)
Chloride: 105 mEq/L (ref 96–112)
Creatinine, Ser: 1.32 mg/dL (ref 0.50–1.35)
GFR calc Af Amer: 70 mL/min — ABNORMAL LOW (ref >90)
GFR calc non Af Amer: 60 mL/min — ABNORMAL LOW (ref >90)
Glucose, Bld: 96 mg/dL (ref 70–99)
Potassium: 4.2 mEq/L (ref 3.5–5.1)
Sodium: 137 mEq/L (ref 135–145)

## 2011-10-19 MED ORDER — HYDROMORPHONE HCL PF 1 MG/ML IJ SOLN
1.0000 mg | Freq: Once | INTRAMUSCULAR | Status: AC
  Administered 2011-10-19: 1 mg via INTRAVENOUS

## 2011-10-19 MED ORDER — PREDNISONE 50 MG PO TABS
60.0000 mg | ORAL_TABLET | Freq: Every day | ORAL | Status: AC
  Administered 2011-10-20 – 2011-10-22 (×3): 60 mg via ORAL
  Filled 2011-10-19 (×3): qty 1

## 2011-10-19 MED ORDER — ALLOPURINOL 100 MG PO TABS
200.0000 mg | ORAL_TABLET | Freq: Every day | ORAL | Status: DC
  Administered 2011-10-19 – 2011-10-24 (×6): 200 mg via ORAL
  Filled 2011-10-19 (×6): qty 2

## 2011-10-19 MED ORDER — ALLOPURINOL 100 MG PO TABS: 200.0000 mg | ORAL_TABLET | Freq: Every day | ORAL | Status: DC

## 2011-10-19 MED ORDER — OXYCODONE-ACETAMINOPHEN 5-325 MG PO TABS
2.0000 | ORAL_TABLET | Freq: Four times a day (QID) | ORAL | Status: DC | PRN
  Administered 2011-10-19 – 2011-10-23 (×9): 2 via ORAL
  Filled 2011-10-19 (×10): qty 2

## 2011-10-19 MED ORDER — CARVEDILOL 6.25 MG PO TABS
6.2500 mg | ORAL_TABLET | Freq: Two times a day (BID) | ORAL | Status: DC
  Administered 2011-10-20 – 2011-10-24 (×8): 6.25 mg via ORAL
  Filled 2011-10-19 (×11): qty 1

## 2011-10-19 NOTE — Progress Notes (Signed)
Subjective: Patient seen and examined ,C/O LUE pain 9/10  Objective: Vital signs in last 24 hours: Temp:  [97.5 F (36.4 C)-98.7 F (37.1 C)] 98.7 F (37.1 C) (05/03 0627) Pulse Rate:  [77-84] 83  (05/03 0627) Resp:  [18-20] 20  (05/03 0627) BP: (122-139)/(71-81) 122/71 mmHg (05/03 0627) SpO2:  [96 %-100 %] 100 % (05/03 0627) Weight:  [66.044 kg (145 lb 9.6 oz)] 66.044 kg (145 lb 9.6 oz) (05/03 0627) Weight change: -5.756 kg (-12 lb 11 oz)    Intake/Output from previous day: 05/02 0701 - 05/03 0700 In: 1097 [P.O.:1080; I.V.:9; IV Piggyback:8] Out: [id number] [Urine:4450] Total I/O In: 480 [P.O.:480] Out: -    Physical Exam: General: Alert, awake, oriented x3, in mild  acute distress. Heart: Regular rate and rhythm, without murmurs, rubs, gallops. Lungs: Clear to auscultation bilaterally. Abdomen: Soft, nontender, nondistended, positive bowel sounds. Extremities: trace pedal edema with positive pedal pulses.left UE ,tender ,mildly swollen Neuro: Grossly intact, nonfocal.    Lab Results: Results for orders placed during the hospital encounter of 10/17/11 (from the past 24 hour(s))  CARDIAC PANEL(CRET KIN+CKTOT+MB+TROPI)     Status: Normal   Collection Time   10/18/11  3:29 PM      Component Value Range   Total CK 51  7 - 232 (U/L)   CK, MB 1.7  0.3 - 4.0 (ng/mL)   Troponin I <0.30  <0.30 (ng/mL)   Relative Index RELATIVE INDEX IS INVALID  0.0 - 2.5   BASIC METABOLIC PANEL     Status: Abnormal   Collection Time   10/19/11  6:06 AM      Component Value Range   Sodium 137  135 - 145 (mEq/L)   Potassium 4.2  3.5 - 5.1 (mEq/L)   Chloride 105  96 - 112 (mEq/L)   CO2 22  19 - 32 (mEq/L)   Glucose, Bld 96  70 - 99 (mg/dL)   BUN 29 (*) 6 - 23 (mg/dL)   Creatinine, Ser 1.32  0.50 - 1.35 (mg/dL)   Calcium 8.1 (*) 8.4 - 10.5 (mg/dL)   GFR calc non Af Amer 60 (*) >90 (mL/min)   GFR calc Af Amer 70 (*) >90 (mL/min)  CBC     Status: Abnormal   Collection Time   10/19/11  6:06 AM    Component Value Range   WBC 13.2 (*) 4.0 - 10.5 (K/uL)   RBC 2.71 (*) 4.22 - 5.81 (MIL/uL)   Hemoglobin 8.8 (*) 13.0 - 17.0 (g/dL)   HCT 24.2 (*) 39.0 - 52.0 (%)   MCV 89.3  78.0 - 100.0 (fL)   MCH 32.5  26.0 - 34.0 (pg)   MCHC 36.4 (*) 30.0 - 36.0 (g/dL)   RDW 20.6 (*) 11.5 - 15.5 (%)   Platelets 191  150 - 400 (K/uL)    Studies/Results: Dg Chest 2 View  10/17/2011  *RADIOLOGY REPORT*  Clinical Data: CHF  CHEST - 2 VIEW  Comparison: 09/05/2010  Findings:  Mild cardiac enlargement is similar to previous exam.  There is no pleural effusion. Moderate pulmonary venous congestion.  No airspace consolidation is identified.  Review of the visualized osseous structures are unremarkable.  IMPRESSION:  1.  Mild CHF.  Original Report Authenticated By: Angelita Ingles, M.D.   Nm Pulmonary Perfusion  10/17/2011  *RADIOLOGY REPORT*  Clinical Data: Sickle cell anemia with shortness of breath.  NM PULMONARY PERFUSION PARTICULATE  Radiopharmaceutical:  3.3 mCi technetium 30m MAA.  Comparison: Chest x-ray from earlier today.  Findings: Multiplanar perfusion imaging shows diffuse bilateral patchy/heterogeneous lung perfusion without moderate or large segmental defects.  Attenuation from the patient's arms is seen on both posterior oblique views.  IMPRESSION: No moderate or large segmental perfusion defect in either lung. Study is low probability for pulmonary embolus.  Original Report Authenticated By: ERIC A. MANSELL, M.D.    Medications:    . antiseptic oral rinse  15 mL Mouth Rinse BID  . aspirin EC  81 mg Oral Daily  . carvedilol  3.125 mg Oral BID WC  . digoxin  0.125 mg Oral Daily  . enoxaparin  30 mg Subcutaneous Q24H  . furosemide  40 mg Intravenous Q12H  . lisinopril  10 mg Oral Daily  . sodium chloride  3 mL Intravenous Q12H    sodium chloride, acetaminophen, diphenhydrAMINE, guaiFENesin-dextromethorphan, HYDROmorphone (DILAUDID) injection, ondansetron (ZOFRAN) IV, ondansetron, sodium  chloride, DISCONTD:  HYDROmorphone (DILAUDID) injection     Assessment/Plan: *Acute on chronic combined systolic and diastolic CHF (congestive heart failure)  - EF 40% in 05/2011 ,repeat echo showed EF 30-355.NEG CARDIAC MARKERS X3  - continue lasix 40 mg twice daily IV  - daily weight , strict I&Os - cont  coreg 3.125 mg BID,and lisinopril  -Heart failure team consulted. Active Problems:  HTN (hypertension)  - controlled .continue lisinopril  And coreg Anemia of chronic disease  - Hgb 6.7 on admission  - patient received 2 units PRBC blood transfusion ,stable  Sickle cell crisis  - continue dilaudid for pain control, IV PRN every 4 hours  - benadryl for itching Q 8 hours IV PRN  - zofran for nausea PRN  -LUE pain mostlikley secondary to sickle cell ,however will order for doppler sono to R/O DVT Leukocytosis  - mild, probably secondary to sickle cell  - no clear source of infection as patient is afebrile with no evidence of pneumonia on CXR  - urinalysis is normal  CKD (chronic kidney disease), stage III  - creatinine appears to be at baseline ,cont to monitor while diuresing. Acute respiratory failure with hypoxia  - perhaps secondary to CHF exacerbation or anemia  - V/Q scan shows low probability for pulmonary embolism  -improving DVT Prophylaxis - lovenox subQ  Code Status - full code  Disposition  -PT eval       LOS: 2 days   Glorine Hanratty 10/19/2011, 8:24 AM

## 2011-10-19 NOTE — Consult Note (Addendum)
Advanced Heart Failure Team Consult Note  Referring Physician: [facility] Primary Physician: Criss Rosales Primary Cardiologist: Dr. Haroldine Laws (although he has not followed up in the office)  Reason for Consultation: Systolic Heart failure  HPI:    Patrick [name] is a 56 y.o. African American male with past medical history significant for sickle cell disease with anemia, NICM, EF 99991111 and systolic heart failure diagnosed 05/2011, no CAD by cath 05/2011, CKD stage III, and tobacco abuse.     Admitted in 12/12 with new onset HF. Diuresed and improved. 05/2011: EF 30-35% range (gloabl HK). RV normal function. Mod LAE.  RHC after diuresis.  RA mean 2  RV 24/3  PA 22/6  PCWP mean 8  LV 96/7  AO 100/64  Oxygen saturations:  PA 78%  AO 99%  Cardiac Output (Fick) 10.4  Cardiac Index (Fick) 5.7    He was to follow up in the Heart Failure Clinic at the beginning of the year but said no one told him when to come.  He has been following up with his PCP and felt he was doing well.  He says he is compliant with his medications except for 1 pill (?Coreg)  that did not have refills.  He says a couple of weeks ago he noticed a cold and since that time he has had increased lower extremity edema and dyspnea.  His weight was up about 10 pounds.  He was having difficulty climbing a flight of stairs and getting dressed.    He presented to the ER on 5/1 and found to be hypoxic with O2 ~87%.  Hypoxia did improve with oxygen per n/c.  CXR showed mild CHF.  Pertinent labs include, ProBNP 7276, BUN 31, Cr 1.45, AST 65, Albumin 2.9.  He was admitted for HF exacerbation and placed on IV lasix.  He has diuresed 8 liters since admission.  Weight down 13 pounds to 145 pounds.  An echo was repeated on 10/17/11 showed LVEF 30-35%, diffuse hypokinesis, diastolic dysfunction, moderately dilated LA, mildly dilated RA, RV normal function and PA systolic pressure [id number] mmHg.    He feels dyspnea is much improved.  His lower extremity  edema has resolved.  He has started to have left forearm/hand pain that may be related to his sickle cell.  LUE doppler pending.  He has been continued on digoxin and lisinopril.  Carvedilol was added back to his regimen.  He continues to be on IV lasix at this time.  Cr has improved to 1.32.   Review of Systems: [y] = yes, [ ]  = no   General: Weight gain Blue.Reese ]; Weight loss [ ] ; Anorexia [ ] ; Fatigue [ ] ; Fever [ ] ; Chills [ ] ; Weakness [ ]   Cardiac: Chest pain/pressure [ ] ; Resting SOB [ ] ; Exertional SOB y[ ] ; Orthopnea [ ] ; Pedal Edema Blue.Reese ]; Palpitations [ ] ; Syncope [ ] ; Presyncope [ ] ; Paroxysmal nocturnal dyspnea[ ]   Pulmonary: Cough Blue.Reese ]; Wheezing[ ] ; Hemoptysis[ ] ; Sputum [ ] ; Snoring [ ]   GI: Vomiting[ ] ; Dysphagia[ ] ; Melena[ ] ; Hematochezia [ ] ; Heartburn[ ] ; Abdominal pain [ ] ; Constipation [ ] ; Diarrhea [ ] ; BRBPR [ ]   GU: Hematuria[ ] ; Dysuria [ ] ; Nocturia[ ]   Vascular: Pain in legs with walking [ ] ; Pain in feet with lying flat [ ] ; Non-healing sores [ ] ; Stroke [ ] ; TIA [ ] ; Slurred speech [ ] ;  Neuro: Headaches[ ] ; Vertigo[ ] ; Seizures[ ] ; Paresthesias[ ] ;Blurred vision [ ] ; Diplopia [ ] ; Vision changes [ ]   Ortho/Skin: Arthritis [ ] ; Joint pain [ ] ; Muscle pain [ y]; Joint swelling [ ] ; Back Pain [ ] ; Rash [ ]   Psych: Depression[ ] ; Anxiety[ ]   Heme: Bleeding problems [ ] ; Clotting disorders [ ] ; Anemia [ ]   Endocrine: Diabetes [ ] ; Thyroid dysfunction[ ]   Home Medications Prior to Admission medications   Medication Sig Start Date End Date Taking? Authorizing Provider  digoxin (LANOXIN) 0.125 MG tablet Take 0.125 mg by mouth daily. 06/12/11 06/11/12 Yes Domenic Polite, MD  furosemide (LASIX) 40 MG tablet Take 40 mg by mouth daily. 06/12/11 06/11/12 Yes Domenic Polite, MD  lisinopril (PRINIVIL,ZESTRIL) 10 MG tablet Take 10 mg by mouth daily. 06/12/11 06/11/12 Yes Domenic Polite, MD    Past Medical History: Past Medical History  Diagnosis Date  . Shortness of breath   .  Anemia     sickle cell anemia  . Headache   . Swelling of extremity, right   . Swelling abdomen   . Swelling of extremity, left   . Sickle cell anemia   . CHF (congestive heart failure)   . Blood dyscrasia     SICKLE CELL   . Blood transfusion     " NO REACTION TO TRANSFUSION"    Past Surgical History: Past Surgical History  Procedure Date  . No past surgeries     Family History: Family History  Problem Relation Age of Onset  . Alcohol abuse Mother   . Liver disease Mother   . Cirrhosis Mother     Social History: History   Social History  . Marital Status: Divorced    Spouse Name: N/A    Number of Children: N/A  . Years of Education: N/A   Social History Main Topics  . Smoking status: Current Everyday Smoker -- 0.5 packs/day for 25 years    Types: Cigarettes  . Smokeless tobacco: Never Used  . Alcohol Use: 4.2 oz/week    7 Cans of beer per week     rare  . Drug Use: No  . Sexually Active: Not Currently   Other Topics Concern  . None   Social History Narrative  . None  Works at [employer]:  No Known Allergies  Objective:    Vital Signs:   Temp:  [97.5 F (36.4 C)-98.7 F (37.1 C)] 98.7 F (37.1 C) (05/03 0627) Pulse Rate:  [77-84] 80  (05/03 1002) Resp:  [20] 20  (05/03 0627) BP: (116-138)/(70-81) 116/70 mmHg (05/03 0900) SpO2:  [96 %-100 %] 100 % (05/03 0627) Weight:  [66.044 kg (145 lb 9.6 oz)] 66.044 kg (145 lb 9.6 oz) (05/03 0627)    Weight change: Filed Weights   10/17/11 1444 10/18/11 0609 10/19/11 0627  Weight: 71.8 kg (158 lb 4.6 oz) 69.355 kg (152 lb 14.4 oz) 66.044 kg (145 lb 9.6 oz)    Intake/Output:   Intake/Output Summary (Last 24 hours) at 10/19/11 1325 Last data filed at 10/19/11 1150  Gross per 24 hour  Intake    850 ml  Output   4150 ml  Net  -3300 ml     Physical Exam: General: WDWN, No respiratory difficulty  HEENT: normal  Neck: supple. JVP 6-7, +HJR Carotids 2+ bilat; no bruits. No  lymphadenopathy or thryomegaly appreciated.  Cor: PMI laterally displaced. Regular. 3/6 MR  Lungs: clear  Abdomen: soft, nontender Liver edge down and pulsatile. No bruits or masses. Good bowel sounds.  Extremities: no cyanosis, + clubbing, no rash, trace edema. L wrist red,  warm and sore to touch Neuro: alert & oriented x 3, cranial nerves grossly intact. moves all 4 extremities w/o difficulty. Affect pleasant.   Telemetry: SR  Labs: Basic Metabolic Panel:  Lab [id number] 0606 10/18/11 0645 10/17/11 2253 10/17/11 0947  NA 137 140 139 136  K 4.2 4.3 4.6 4.1  CL 105 109 108 108  CO2 22 21 22 20   GLUCOSE 96 83 91 114*  BUN 29* 30* 32* 31*  CREATININE 1.32 1.34 1.47* 1.45*  CALCIUM 8.1* 8.0* 8.3* --  MG -- -- 2.0 --  PHOS -- -- -- --    Liver Function Tests:  Lab 10/17/11 2253 10/17/11 0947  AST 63* 65*  ALT 25 25  ALKPHOS 102 120*  BILITOT 3.4* 3.3*  PROT 6.4 6.1  ALBUMIN 2.9* 2.9*   No results found for this basename: LIPASE:5,AMYLASE:5 in the last 168 hours No results found for this basename: AMMONIA:3 in the last 168 hours  CBC:  Lab 10/19/11 0606 10/17/11 2253 10/17/11 0947  WBC 13.2* 11.6* 12.8*  NEUTROABS -- 7.6 8.2*  HGB 8.8* 8.3* 6.7*  HCT 24.2* 22.9* 18.2*  MCV 89.3 90.5 89.7  PLT 191 186 213    Cardiac Enzymes:  Lab 10/18/11 1529 10/18/11 0645 10/17/11 2254  CKTOTAL 51 59 87  CKMB 1.7 2.0 2.2  CKMBINDEX -- -- --  TROPONINI <0.30 <0.30 <0.30    BNP: BNP (last 3 results)  Basename 10/17/11 1149 06/08/11 0625 06/07/11 1604  PROBNP 7276.0* 5459.0* 5641.0*    CBG: No results found for this basename: GLUCAP:5 in the last 168 hours  Coagulation Studies:  Basename 10/17/11 0947  LABPROT 16.3*  INR 1.29    Other results: EKG:  SR at 86 bpm with poor R wave progression, PR 204 ms  Imaging: Nm Pulmonary Perfusion  10/17/2011  *RADIOLOGY REPORT*  Clinical Data: Sickle cell anemia with shortness of breath.  NM PULMONARY PERFUSION PARTICULATE   Radiopharmaceutical:  3.3 mCi technetium 63m MAA.  Comparison: Chest x-ray from earlier today.  Findings: Multiplanar perfusion imaging shows diffuse bilateral patchy/heterogeneous lung perfusion without moderate or large segmental defects.  Attenuation from the patient's arms is seen on both posterior oblique views.  IMPRESSION: No moderate or large segmental perfusion defect in either lung. Study is low probability for pulmonary embolus.  Original Report Authenticated By: ERIC A. MANSELL, M.D.      Medications:     Current Medications:    . antiseptic oral rinse  15 mL Mouth Rinse BID  . aspirin EC  81 mg Oral Daily  . carvedilol  3.125 mg Oral BID WC  . digoxin  0.125 mg Oral Daily  . enoxaparin  30 mg Subcutaneous Q24H  . furosemide  40 mg Intravenous Q12H  .  HYDROmorphone (DILAUDID) injection  1 mg Intravenous Once  . lisinopril  10 mg Oral Daily  . sodium chloride  3 mL Intravenous Q12H     Infusions:      Assessment:   1) Acute on chronic systolic HF       -EF 99991111 2) Sickle cell anemia  3) Acute renal failure, improved  4) Tobacco use  5) ETOH use  Plan/Discussion:    Mr. Camerino presents with his second heart failure admission in 6 months.  He was lost to follow up in the outpatient setting.  His volume status is mildly elevated, would continue IV lasix for today and probably change back to oral regimen tomorrow.  Would also titrate carvedilol to  6.25 mg BID.  Continue to monitor renal function closely.  Will need follow up in the HF clinic at discharge.    Length of Stay: 2  Patrick [name] 10/19/2011, 1:25 PM  Patient seen and examined with Patrick Haven, PA-C. We discussed all aspects of the encounter. I agree with the assessment and plan as stated above. He has had a CHF flare in the setting of running out of some of his meds and lack of follow-up. Now nearing baseline. Would continue IV diuresis tonight and switch to po in the am. Can likely d/c on Sunday.  We provided HF education on salt and fluid restriction. L wrist appears to be gout. Will give prednisone 60mg  x 3 days and start allopurinol 200mg  daily once flare improved. Check uric acid.  Have made appoint for him to f/u in HF clinic next week. Reinforced need to stop tobacco and ETOH use. Need to consider ICD at some point.   Patrick [name] 6:42 PM

## 2011-10-19 NOTE — Evaluation (Signed)
Physical Therapy Evaluation Patient Details Name: Patrick [name] MRN: [id number] DOB: Feb 24, 1956 Today's Date: 10/19/2011 Time: [id number] PT Time Calculation (min): 20 min  PT Assessment / Plan / Recommendation Clinical Impression  56 year old male with multiple comorbidities including but not limited to systolic and diastolic congestive heart, hypertension, sickle cell disease who presented to ED with complaints of worsening shortness of breath for one week prior to the admission.  Pt presents with limited use of L UE secondary to acute pain (likely sickle cell).  Pt demonstrates safety and independence with all mobility.  Pt became hypoxic during session SpO2<86 while ambulating on room air.  No acute PT needs will benefit from OT eval to assess L UE and may benefit from Pulmonary rehab consult        PT Assessment  Patent does not need any further PT services    Follow Up Recommendations  No PT follow up    Equipment Recommendations  None recommended by PT    Frequency      Precautions / Restrictions Precautions Precautions: None Restrictions Weight Bearing Restrictions: No    Pertinent Vitals/Pain Pain:  8/10      Mobility  Bed Mobility Bed Mobility: Supine to Sit Supine to Sit: 7: Independent;HOB flat Details for Bed Mobility Assistance: pt avoids using Left UE secondary to pain.  Transfers Transfers: Sit to Stand;Stand to Sit Sit to Stand: 7: Independent;From bed;Without upper extremity assist Stand to Sit: 7: Independent;To chair/3-in-1;Without upper extremity assist Ambulation/Gait Ambulation/Gait Assistance: 7: Independent Ambulation Distance (Feet): 200 Feet Assistive device: None Ambulation/Gait Assistance Details: No assistance required.  Gait Pattern: Within Functional Limits Stairs: Yes Stairs Assistance: 7: Independent Stair Management Technique: No rails Number of Stairs: 2  Wheelchair Mobility Wheelchair Mobility: No    Exercises     PT  Goals    Visit Information  Last PT Received On: 10/19/11    Subjective Data  Patient Stated Goal: be rid of pain   Prior Functioning  Home Living Lives With: Alone Available Help at Discharge: Family;Available 24 hours/day Type of Home: House Home Access: Stairs to enter [employer] of Steps: 2 Entrance Stairs-Rails: None Home Layout: One level Bathroom Shower/Tub: Product/process development scientist: Standard Bathroom Accessibility: No Home Adaptive Equipment: None Prior Function Level of Independence: Independent Able to Take Stairs?: Yes Driving: Yes Vocation: Full time employment Communication Communication: No difficulties Dominant Hand: Left    Cognition  Overall Cognitive Status: Appears within functional limits for tasks assessed/performed Arousal/Alertness: Awake/alert Orientation Level: Oriented X4 / Intact Behavior During Session: [facility] for tasks performed    Extremity/Trunk Assessment Right Upper Extremity Assessment RUE ROM/Strength/Tone: Within functional levels RUE Sensation: WFL - Proprioception;WFL - Light Touch RUE Coordination: WFL - gross/fine motor Left Upper Extremity Assessment LUE ROM/Strength/Tone: Deficits;Due to pain LUE Coordination: Deficits Right Lower Extremity Assessment RLE ROM/Strength/Tone: Within functional levels Left Lower Extremity Assessment LLE ROM/Strength/Tone: WFL for tasks assessed   Balance Balance Balance Assessed: Yes Standardized Balance Assessment Standardized Balance Assessment: Dynamic Gait Index Dynamic Gait Index Level Surface: Normal Change in Gait Speed: Normal Gait with Horizontal Head Turns: Normal Gait with Vertical Head Turns: Normal Gait and Pivot Turn: Normal Step Over Obstacle: Normal Step Around Obstacles: Normal Steps: Normal Total Score: 24   End of Session PT - End of Session Equipment Utilized During Treatment: Gait belt Activity Tolerance: Other (comment);Patient  tolerated treatment well (no distress but O2 sats dropped to 85 on room air) Patient left: in chair;with call bell/phone within  reach Nurse Communication: Mobility status;Other (comment) (activity tolerance)   Sedric Guia 10/19/2011, 10:25 AM Collier Salina L. Ayah Cozzolino DPT [phone]

## 2011-10-19 NOTE — Progress Notes (Addendum)
VASCULAR LAB PRELIMINARY  PRELIMINARY  PRELIMINARY  PRELIMINARY  Left upper extremity venous duplex completed.    Preliminary report:  Left:  No evidence of DVT or superficial thrombosis.  Vascularized lymph node noted in left axilla.  Judithann Sauger, RVT 10/19/2011, 2:23 PM

## 2011-10-20 DIAGNOSIS — M109 Gout, unspecified: Secondary | ICD-10-CM

## 2011-10-20 DIAGNOSIS — I5041 Acute combined systolic (congestive) and diastolic (congestive) heart failure: Secondary | ICD-10-CM

## 2011-10-20 DIAGNOSIS — R112 Nausea with vomiting, unspecified: Secondary | ICD-10-CM

## 2011-10-20 DIAGNOSIS — D57819 Other sickle-cell disorders with crisis, unspecified: Secondary | ICD-10-CM

## 2011-10-20 LAB — BASIC METABOLIC PANEL
BUN: 35 mg/dL — ABNORMAL HIGH (ref 6–23)
BUN: 38 mg/dL — ABNORMAL HIGH (ref 6–23)
CO2: 25 mEq/L (ref 19–32)
CO2: 25 mEq/L (ref 19–32)
Calcium: 8.5 mg/dL (ref 8.4–10.5)
Calcium: 8.2 mg/dL — ABNORMAL LOW (ref 8.4–10.5)
Chloride: 101 mEq/L (ref 96–112)
Chloride: 100 mEq/L (ref 96–112)
Creatinine, Ser: 1.4 mg/dL — ABNORMAL HIGH (ref 0.50–1.35)
Creatinine, Ser: 1.61 mg/dL — ABNORMAL HIGH (ref 0.50–1.35)
GFR calc Af Amer: 65 mL/min — ABNORMAL LOW (ref >90)
GFR calc Af Amer: 55 mL/min — ABNORMAL LOW (ref >90)
GFR calc non Af Amer: 56 mL/min — ABNORMAL LOW (ref >90)
GFR calc non Af Amer: 47 mL/min — ABNORMAL LOW (ref >90)
Glucose, Bld: 98 mg/dL (ref 70–99)
Glucose, Bld: 205 mg/dL — ABNORMAL HIGH (ref 70–99)
Potassium: 4 mEq/L (ref 3.5–5.1)
Potassium: 4.2 mEq/L (ref 3.5–5.1)
Sodium: 136 mEq/L (ref 135–145)
Sodium: 134 mEq/L — ABNORMAL LOW (ref 135–145)

## 2011-10-20 LAB — CBC
HCT: 26.7 % — ABNORMAL LOW (ref 39.0–52.0)
Hemoglobin: 9.6 g/dL — ABNORMAL LOW (ref 13.0–17.0)
MCH: 32.7 pg (ref 26.0–34.0)
MCHC: 36 g/dL (ref 30.0–36.0)
MCV: 90.8 fL (ref 78.0–100.0)
Platelets: 177 10*3/uL (ref 150–400)
RBC: 2.94 MIL/uL — ABNORMAL LOW (ref 4.22–5.81)
RDW: 19.9 % — ABNORMAL HIGH (ref 11.5–15.5)
WBC: 13.7 10*3/uL — ABNORMAL HIGH (ref 4.0–10.5)

## 2011-10-20 LAB — URIC ACID: Uric Acid, Serum: 15 mg/dL — ABNORMAL HIGH (ref 4.0–7.8)

## 2011-10-20 MED ORDER — HYDROMORPHONE HCL PF 1 MG/ML IJ SOLN
2.0000 mg | Freq: Three times a day (TID) | INTRAMUSCULAR | Status: DC | PRN
  Administered 2011-10-20 – 2011-10-23 (×4): 2 mg via INTRAVENOUS
  Filled 2011-10-20 (×4): qty 2

## 2011-10-20 MED ORDER — COLCHICINE 0.6 MG PO TABS
0.6000 mg | ORAL_TABLET | Freq: Two times a day (BID) | ORAL | Status: DC
  Administered 2011-10-20: 0.6 mg via ORAL
  Filled 2011-10-20 (×3): qty 1

## 2011-10-20 MED ORDER — FUROSEMIDE 40 MG PO TABS
40.0000 mg | ORAL_TABLET | Freq: Every day | ORAL | Status: DC
  Administered 2011-10-20: 40 mg via ORAL
  Filled 2011-10-20 (×3): qty 1

## 2011-10-20 MED ORDER — LISINOPRIL 10 MG PO TABS
10.0000 mg | ORAL_TABLET | Freq: Two times a day (BID) | ORAL | Status: DC
  Administered 2011-10-20: 10 mg via ORAL
  Filled 2011-10-20 (×3): qty 1

## 2011-10-20 MED ORDER — ENOXAPARIN SODIUM 40 MG/0.4ML ~~LOC~~ SOLN
40.0000 mg | SUBCUTANEOUS | Status: DC
  Administered 2011-10-20 – 2011-10-22 (×3): 40 mg via SUBCUTANEOUS
  Filled 2011-10-20 (×5): qty 0.4

## 2011-10-20 NOTE — Progress Notes (Signed)
Subjective: Patient seen and examined, feeling better. Left upper extremity pain and swelling significantly improved  Objective: Vital signs in last 24 hours: Temp:  [97.9 F (36.6 C)-98.6 F (37 C)] 98.6 F (37 C) (05/04 0521) Pulse Rate:  [77-104] 82  (05/04 1019) Resp:  [18-20] 18  (05/04 1019) BP: (93-143)/(55-82) 93/55 mmHg (05/04 1019) SpO2:  [99 %-100 %] 99 % (05/04 1019) Weight:  [62.959 kg (138 lb 12.8 oz)] 62.959 kg (138 lb 12.8 oz) (05/04 0521) Weight change: -3.084 kg (-6 lb 12.8 oz) Last BM Date: 10/16/11  Intake/Output from previous day: 05/03 0701 - 05/04 0700 In: 1148 [P.O.:1140; IV Piggyback:8] Out: [id number] [Urine:4125] Total I/O In: 223 [P.O.:220; I.V.:3] Out: -    Physical Exam:  General: Alert, awake, oriented x3, in mild acute distress.  Heart: Regular rate and rhythm, without murmurs, rubs, gallops.  Lungs: Clear to auscultation bilaterally.  Abdomen: Soft, nontender, nondistended, positive bowel sounds.  Extremities: trace pedal edema with positive pedal pulses.left UE mildly tender, swelling resolved. Neuro: Grossly intact, nonfocal.    Lab Results: Results for orders placed during the hospital encounter of 10/17/11 (from the past 24 hour(s))  BASIC METABOLIC PANEL     Status: Abnormal   Collection Time   10/20/11  5:30 AM      Component Value Range   Sodium 136  135 - 145 (mEq/L)   Potassium 4.0  3.5 - 5.1 (mEq/L)   Chloride 101  96 - 112 (mEq/L)   CO2 25  19 - 32 (mEq/L)   Glucose, Bld 98  70 - 99 (mg/dL)   BUN 35 (*) 6 - 23 (mg/dL)   Creatinine, Ser 1.40 (*) 0.50 - 1.35 (mg/dL)   Calcium 8.5  8.4 - 10.5 (mg/dL)   GFR calc non Af Amer 56 (*) >90 (mL/min)   GFR calc Af Amer 65 (*) >90 (mL/min)  CBC     Status: Abnormal   Collection Time   10/20/11  5:30 AM      Component Value Range   WBC 13.7 (*) 4.0 - 10.5 (K/uL)   RBC 2.94 (*) 4.22 - 5.81 (MIL/uL)   Hemoglobin 9.6 (*) 13.0 - 17.0 (g/dL)   HCT 26.7 (*) 39.0 - 52.0 (%)   MCV 90.8  78.0 -  100.0 (fL)   MCH 32.7  26.0 - 34.0 (pg)   MCHC 36.0  30.0 - 36.0 (g/dL)   RDW 19.9 (*) 11.5 - 15.5 (%)   Platelets 177  150 - 400 (K/uL)  URIC ACID     Status: Abnormal   Collection Time   10/20/11  5:30 AM      Component Value Range   Uric Acid, Serum 15.0 (*) 4.0 - 7.8 (mg/dL)  BASIC METABOLIC PANEL     Status: Abnormal   Collection Time   10/20/11  9:40 AM      Component Value Range   Sodium 134 (*) 135 - 145 (mEq/L)   Potassium 4.2  3.5 - 5.1 (mEq/L)   Chloride 100  96 - 112 (mEq/L)   CO2 25  19 - 32 (mEq/L)   Glucose, Bld 205 (*) 70 - 99 (mg/dL)   BUN 38 (*) 6 - 23 (mg/dL)   Creatinine, Ser 1.61 (*) 0.50 - 1.35 (mg/dL)   Calcium 8.2 (*) 8.4 - 10.5 (mg/dL)   GFR calc non Af Amer 47 (*) >90 (mL/min)   GFR calc Af Amer 55 (*) >90 (mL/min)    Studies/Results: No results found.  Medications:    .  allopurinol  200 mg Oral Daily  . antiseptic oral rinse  15 mL Mouth Rinse BID  . aspirin EC  81 mg Oral Daily  . carvedilol  6.25 mg Oral BID WC  . colchicine  0.6 mg Oral BID  . digoxin  0.125 mg Oral Daily  . enoxaparin  40 mg Subcutaneous Q24H  . furosemide  40 mg Oral Daily  . lisinopril  10 mg Oral BID  . predniSONE  60 mg Oral Q breakfast  . sodium chloride  3 mL Intravenous Q12H  . DISCONTD: allopurinol  200 mg Oral Daily  . DISCONTD: carvedilol  3.125 mg Oral BID WC  . DISCONTD: enoxaparin  30 mg Subcutaneous Q24H  . DISCONTD: furosemide  40 mg Intravenous Q12H  . DISCONTD: lisinopril  10 mg Oral Daily    sodium chloride, acetaminophen, diphenhydrAMINE, guaiFENesin-dextromethorphan, HYDROmorphone (DILAUDID) injection, ondansetron (ZOFRAN) IV, ondansetron, oxyCODONE-acetaminophen, sodium chloride     Assessment/Plan:  *Acute on chronic combined systolic and diastolic CHF (congestive heart failure)  - EF 40% in 05/2011 ,repeat echo showed EF 30-355.NEG CARDIAC MARKERS X3  -greatly appreciated Heart failure team input  - continue lasix , coreg and lisinopril as  per card recommendations and adjustments - daily weight , strict I&Os  Active Problems:  HTN (hypertension)  - controlled .continue lisinopril And coreg  Anemia of chronic disease  - Hgb 6.7 on admission  - patient received 2 units PRBC blood transfusion ,stable  Sickle cell crisis versus acute gouty flare -Unclear as if symptoms were simply secondary to gout flare rather  than sickle cell crisis. - Taper IV Dilaudid down  -Patient denies any history of gout , uric acid level was found to be 15 -Total bilirubin 3.4 which can indicate hemolysis, check fractionated in direct level, Check LDH. -Continue prednisone, since patient is improving on prednisone I would recommend to avoid colchicine given worsening renal function. -LUE pain doppler sono was negative for  DVT  Leukocytosis  - mild, probably secondary to sickle cell  - no clear source of infection as patient is afebrile with no evidence of pneumonia on CXR  - urinalysis is normal  CKD (chronic kidney disease), stage III  -Worsening renal function with diuresis noted ,cont to monitor  Acute respiratory failure with hypoxia  - perhaps secondary to CHF exacerbation or anemia  - V/Q scan shows low probability for pulmonary embolism  -improving , titrate O2 down DVT Prophylaxis - lovenox subQ  Code Status - full code  Disposition  To home possibly in one to 2 days.        LOS: 3 days   Fayette Gasner 10/20/2011, 12:08 PM

## 2011-10-20 NOTE — Progress Notes (Signed)
Advanced Heart Failure Rounding Note   Subjective:    Feels great. Weight down 20 pounds from admit. No orthopnea or PND.   Started prednisone for gout in L wrist. Some better but still sore.     Objective:   Weight Range:  Vital Signs:   Temp:  [97.9 F (36.6 C)-98.6 F (37 C)] 98.6 F (37 C) (05/04 0521) Pulse Rate:  [77-104] 104  (05/04 0802) Resp:  [18-20] 18  (05/04 0802) BP: (116-143)/(70-82) 143/82 mmHg (05/04 0802) SpO2:  [99 %-100 %] 100 % (05/04 0521) Weight:  [62.959 kg (138 lb 12.8 oz)] 62.959 kg (138 lb 12.8 oz) (05/04 0521) Last BM Date: 10/16/11  Weight change: Filed Weights   10/18/11 0609 10/19/11 0627 10/20/11 0521  Weight: 69.355 kg (152 lb 14.4 oz) 66.044 kg (145 lb 9.6 oz) 62.959 kg (138 lb 12.8 oz)    Intake/Output:   Intake/Output Summary (Last 24 hours) at 10/20/11 0853 Last data filed at 10/20/11 0653  Gross per 24 hour  Intake    668 ml  Output   4125 ml  Net  -3457 ml     Physical Exam: General: WDWN, No respiratory difficulty  HEENT: normal  Neck: supple. JVP 5,  Carotids 2+ bilat; no bruits. No lymphadenopathy or thryomegaly appreciated.  Cor: PMI laterally displaced. Regular. 3/6 MR  Lungs: clear  Abdomen: soft, nontender . No bruits or masses. Good bowel sounds.  Extremities: no cyanosis, + clubbing, no rash, trace edema. L wrist  sore to touch less red Neuro: alert & oriented x 3, cranial nerves grossly intact. moves all 4 extremities w/o difficulty. Affect pleasant.   Telemetry: sinus  Labs: Basic Metabolic Panel:  Lab [id number] 0530 10/19/11 0606 10/18/11 0645 10/17/11 2253 10/17/11 0947  NA 136 137 140 139 136  K 4.0 4.2 4.3 4.6 4.1  CL 101 105 109 108 108  CO2 25 22 21 22 20   GLUCOSE 98 96 83 91 114*  BUN 35* 29* 30* 32* 31*  CREATININE 1.40* 1.32 1.34 1.47* 1.45*  CALCIUM 8.5 8.1* 8.0* -- --  MG -- -- -- 2.0 --  PHOS -- -- -- -- --    Liver Function Tests:  Lab 10/17/11 2253 10/17/11 0947  AST 63* 65*  ALT  25 25  ALKPHOS 102 120*  BILITOT 3.4* 3.3*  PROT 6.4 6.1  ALBUMIN 2.9* 2.9*   No results found for this basename: LIPASE:5,AMYLASE:5 in the last 168 hours No results found for this basename: AMMONIA:3 in the last 168 hours  CBC:  Lab 10/20/11 0530 10/19/11 0606 10/17/11 2253 10/17/11 0947  WBC 13.7* 13.2* 11.6* 12.8*  NEUTROABS -- -- 7.6 8.2*  HGB 9.6* 8.8* 8.3* 6.7*  HCT 26.7* 24.2* 22.9* 18.2*  MCV 90.8 89.3 90.5 89.7  PLT 177 191 186 213    Cardiac Enzymes:  Lab 10/18/11 1529 10/18/11 0645 10/17/11 2254  CKTOTAL 51 59 87  CKMB 1.7 2.0 2.2  CKMBINDEX -- -- --  TROPONINI <0.30 <0.30 <0.30    BNP: BNP (last 3 results)  Basename 10/17/11 1149 06/08/11 0625 06/07/11 1604  PROBNP 7276.0* 5459.0* 5641.0*     Other results:  Imaging:  No results found.   Medications:     Scheduled Medications:    . allopurinol  200 mg Oral Daily  . antiseptic oral rinse  15 mL Mouth Rinse BID  . aspirin EC  81 mg Oral Daily  . carvedilol  6.25 mg Oral BID WC  . digoxin  0.125 mg Oral Daily  . enoxaparin  30 mg Subcutaneous Q24H  . furosemide  40 mg Intravenous Q12H  . lisinopril  10 mg Oral Daily  . predniSONE  60 mg Oral Q breakfast  . sodium chloride  3 mL Intravenous Q12H  . DISCONTD: allopurinol  200 mg Oral Daily  . DISCONTD: carvedilol  3.125 mg Oral BID WC     Infusions:     PRN Medications:  sodium chloride, acetaminophen, diphenhydrAMINE, guaiFENesin-dextromethorphan, HYDROmorphone (DILAUDID) injection, ondansetron (ZOFRAN) IV, ondansetron, oxyCODONE-acetaminophen, sodium chloride   Assessment:  1) Acute on chronic systolic HF  -EF 99991111  2) Sickle cell anemia  3) Acute renal failure, improved  4) Tobacco use  5) ETOH use 6) Probable gout L wrist     -uric acid 15.0   Plan/Discussion:     HF much improved. Now euvolemic. Change lasix to po. Can likely d/c in am if stable. We started prednisone 60mg  x 3 days last night for gout. Somewhat  improved. Will add colchicine. Will also start allopurinol 200mg  daily now that flare improved.   Ambulate.  Has f/u arranged for next week in HF clinic.    Length of Stay: 3   Patrick [name] 10/20/2011, 8:53 AM

## 2011-10-20 NOTE — Progress Notes (Signed)
Was in room with patient rechecking blood pressure when monitor tech called saying patient had a 5 beat run of vtach. Patient remained alert and asymptomatic. Vital signs stable. Strip printed, reviewed, and added to chart. Will continue to monitor.   awalker,rn

## 2011-10-21 DIAGNOSIS — D57819 Other sickle-cell disorders with crisis, unspecified: Secondary | ICD-10-CM

## 2011-10-21 DIAGNOSIS — I472 Ventricular tachycardia, unspecified: Secondary | ICD-10-CM

## 2011-10-21 DIAGNOSIS — I5041 Acute combined systolic (congestive) and diastolic (congestive) heart failure: Secondary | ICD-10-CM

## 2011-10-21 DIAGNOSIS — M109 Gout, unspecified: Secondary | ICD-10-CM

## 2011-10-21 DIAGNOSIS — R112 Nausea with vomiting, unspecified: Secondary | ICD-10-CM

## 2011-10-21 LAB — COMPREHENSIVE METABOLIC PANEL
ALT: 28 U/L (ref 0–53)
AST: 50 U/L — ABNORMAL HIGH (ref 0–37)
Albumin: 2.5 g/dL — ABNORMAL LOW (ref 3.5–5.2)
Alkaline Phosphatase: 79 U/L (ref 39–117)
BUN: 54 mg/dL — ABNORMAL HIGH (ref 6–23)
CO2: 25 mEq/L (ref 19–32)
Calcium: 7.9 mg/dL — ABNORMAL LOW (ref 8.4–10.5)
Chloride: 100 mEq/L (ref 96–112)
Creatinine, Ser: 1.73 mg/dL — ABNORMAL HIGH (ref 0.50–1.35)
GFR calc Af Amer: 50 mL/min — ABNORMAL LOW (ref >90)
GFR calc non Af Amer: 43 mL/min — ABNORMAL LOW (ref >90)
Glucose, Bld: 116 mg/dL — ABNORMAL HIGH (ref 70–99)
Potassium: 4 mEq/L (ref 3.5–5.1)
Sodium: 135 mEq/L (ref 135–145)
Total Bilirubin: 2.8 mg/dL — ABNORMAL HIGH (ref 0.3–1.2)
Total Protein: 6.2 g/dL (ref 6.0–8.3)

## 2011-10-21 LAB — CBC
HCT: 23.3 % — ABNORMAL LOW (ref 39.0–52.0)
Hemoglobin: 8.5 g/dL — ABNORMAL LOW (ref 13.0–17.0)
MCH: 32.2 pg (ref 26.0–34.0)
MCHC: 36.5 g/dL — ABNORMAL HIGH (ref 30.0–36.0)
MCV: 88.3 fL (ref 78.0–100.0)
Platelets: 168 10*3/uL (ref 150–400)
RBC: 2.64 MIL/uL — ABNORMAL LOW (ref 4.22–5.81)
RDW: 19.2 % — ABNORMAL HIGH (ref 11.5–15.5)
WBC: 14.6 10*3/uL — ABNORMAL HIGH (ref 4.0–10.5)

## 2011-10-21 LAB — LACTATE DEHYDROGENASE: LDH: 838 U/L — ABNORMAL HIGH (ref 94–250)

## 2011-10-21 NOTE — Progress Notes (Signed)
Advanced Heart Failure Rounding Note   Subjective:    Feels good. Breathing fine but still with cough. Wrist feeling better  5 beat NSVT overnight.  Labs c/w hemolysis from sickle cell.    Objective:   Weight Range:  Vital Signs:   Temp:  [97.9 F (36.6 C)-98.7 F (37.1 C)] 98.7 F (37.1 C) (05/05 0452) Pulse Rate:  [68-82] 68  (05/05 0452) Resp:  [18] 18  (05/05 0452) BP: (93-112)/(55-73) 110/70 mmHg (05/05 0452) SpO2:  [98 %-100 %] 99 % (05/05 0452) Weight:  [63.7 kg (140 lb 6.9 oz)] 63.7 kg (140 lb 6.9 oz) (05/05 0452) Last BM Date: 10/16/11  Weight change: Filed Weights   10/19/11 0627 10/20/11 0521 10/21/11 0452  Weight: 66.044 kg (145 lb 9.6 oz) 62.959 kg (138 lb 12.8 oz) 63.7 kg (140 lb 6.9 oz)    Intake/Output:   Intake/Output Summary (Last 24 hours) at 10/21/11 0845 Last data filed at 10/21/11 0556  Gross per 24 hour  Intake   1283 ml  Output   1200 ml  Net     83 ml     Physical Exam: General: WDWN, No respiratory difficulty  HEENT: normal  Neck: supple. JVP 5,  Carotids 2+ bilat; no bruits. No lymphadenopathy or thryomegaly appreciated.  Cor: PMI laterally displaced. Regular. 3/6 MR  Lungs: clear  Abdomen: soft, nontender . No bruits or masses. Good bowel sounds.  Extremities: no cyanosis, + clubbing, no rash, trace edema. L wrist  sore to touch less red Neuro: alert & oriented x 3, cranial nerves grossly intact. moves all 4 extremities w/o difficulty. Affect pleasant.   Telemetry: sinus 5 beat NSVT  Labs: Basic Metabolic Panel:  Lab [id number] 10/20/11 0940 10/20/11 0530 10/19/11 0606 10/18/11 0645 10/17/11 2253  NA 135 134* 136 137 140 --  K 4.0 4.2 4.0 4.2 4.3 --  CL 100 100 101 105 109 --  CO2 25 25 25 22 21  --  GLUCOSE 116* 205* 98 96 83 --  BUN 54* 38* 35* 29* 30* --  CREATININE 1.73* 1.61* 1.40* 1.32 1.34 --  CALCIUM 7.9* 8.2* 8.5 -- -- --  MG -- -- -- -- -- 2.0  PHOS -- -- -- -- -- --    Liver Function Tests:  Lab  10/21/11 0648 10/17/11 2253 10/17/11 0947  AST 50* 63* 65*  ALT 28 25 25   ALKPHOS 79 102 120*  BILITOT 2.8* 3.4* 3.3*  PROT 6.2 6.4 6.1  ALBUMIN 2.5* 2.9* 2.9*   No results found for this basename: LIPASE:5,AMYLASE:5 in the last 168 hours No results found for this basename: AMMONIA:3 in the last 168 hours  CBC:  Lab 10/21/11 0648 10/20/11 0530 10/19/11 0606 10/17/11 2253 10/17/11 0947  WBC 14.6* 13.7* 13.2* 11.6* 12.8*  NEUTROABS -- -- -- 7.6 8.2*  HGB 8.5* 9.6* 8.8* 8.3* 6.7*  HCT 23.3* 26.7* 24.2* 22.9* 18.2*  MCV 88.3 90.8 89.3 90.5 89.7  PLT 168 177 191 186 213    Cardiac Enzymes:  Lab 10/18/11 1529 10/18/11 0645 10/17/11 2254  CKTOTAL 51 59 87  CKMB 1.7 2.0 2.2  CKMBINDEX -- -- --  TROPONINI <0.30 <0.30 <0.30    BNP: BNP (last 3 results)  Basename 10/17/11 1149 06/08/11 0625 06/07/11 1604  PROBNP 7276.0* 5459.0* 5641.0*     Other results:  Imaging: No results found.   Medications:     Scheduled Medications:    . allopurinol  200 mg Oral Daily  . antiseptic oral  rinse  15 mL Mouth Rinse BID  . aspirin EC  81 mg Oral Daily  . carvedilol  6.25 mg Oral BID WC  . digoxin  0.125 mg Oral Daily  . enoxaparin  40 mg Subcutaneous Q24H  . furosemide  40 mg Oral Daily  . lisinopril  10 mg Oral BID  . predniSONE  60 mg Oral Q breakfast  . DISCONTD: colchicine  0.6 mg Oral BID  . DISCONTD: enoxaparin  30 mg Subcutaneous Q24H  . DISCONTD: furosemide  40 mg Intravenous Q12H  . DISCONTD: lisinopril  10 mg Oral Daily  . DISCONTD: sodium chloride  3 mL Intravenous Q12H    Infusions:    PRN Medications: acetaminophen, diphenhydrAMINE, guaiFENesin-dextromethorphan, HYDROmorphone (DILAUDID) injection, ondansetron (ZOFRAN) IV, oxyCODONE-acetaminophen, DISCONTD: sodium chloride, DISCONTD:  HYDROmorphone (DILAUDID) injection, DISCONTD: ondansetron, DISCONTD: sodium chloride   Assessment:  1) Acute on chronic systolic HF  -EF 99991111  2) Sickle cell anemia  3)  Acute renal failure 4) Tobacco use  5) ETOH use 6) Probable gout L wrist     -uric acid 15.0 7) NSVT  Plan/Discussion:     HF much improved however he has been overdiuresed and Cr climbing. Would hold lasix today and encouraged po fluid intake today. If renal function better can go home tomorrow am. Will need outpatient referral for ICD.  Has f/u arranged for later this week in HF clinic.    Length of Stay: [place] 10/21/2011, 8:45 AM

## 2011-10-21 NOTE — Progress Notes (Signed)
Subjective: Patient seen and examined, feeling much better today.  Objective: Vital signs in last 24 hours: Temp:  [97.9 F (36.6 C)-98.7 F (37.1 C)] 98.7 F (37.1 C) (05/05 0452) Pulse Rate:  [68-82] 68  (05/05 0452) Resp:  [18] 18  (05/05 0452) BP: (93-112)/(55-73) 110/70 mmHg (05/05 0452) SpO2:  [98 %-100 %] 99 % (05/05 0452) Weight:  [63.7 kg (140 lb 6.9 oz)] 63.7 kg (140 lb 6.9 oz) (05/05 0452) Weight change: 0.741 kg (1 lb 10.1 oz) Last BM Date: 10/16/11  Intake/Output from previous day: 05/04 0701 - 05/05 0700 In: 1283 [P.O.:1280; I.V.:3] Out: 1200 [Urine:1200]     Physical Exam: General: Alert, awake, oriented x3, in no acute distress. HEENT: No bruits, no goiter. Heart: Regular rate and rhythm, without murmurs, rubs, gallops. Lungs: Clear to auscultation bilaterally. Abdomen: Soft, nontender, nondistended, positive bowel sounds. Extremities: No clubbing cyanosis or edema with positive pedal pulses. Left upper extremity swelling resolved and with full range of motion. Neuro: Grossly intact, nonfocal.    Lab Results: Results for orders placed during the hospital encounter of 10/17/11 (from the past 24 hour(s))  BASIC METABOLIC PANEL     Status: Abnormal   Collection Time   10/20/11  9:40 AM      Component Value Range   Sodium 134 (*) 135 - 145 (mEq/L)   Potassium 4.2  3.5 - 5.1 (mEq/L)   Chloride 100  96 - 112 (mEq/L)   CO2 25  19 - 32 (mEq/L)   Glucose, Bld 205 (*) 70 - 99 (mg/dL)   BUN 38 (*) 6 - 23 (mg/dL)   Creatinine, Ser 1.61 (*) 0.50 - 1.35 (mg/dL)   Calcium 8.2 (*) 8.4 - 10.5 (mg/dL)   GFR calc non Af Amer 47 (*) >90 (mL/min)   GFR calc Af Amer 55 (*) >90 (mL/min)  LACTATE DEHYDROGENASE     Status: Abnormal   Collection Time   10/21/11  6:48 AM      Component Value Range   LDH 838 (*) 94 - 250 (U/L)  COMPREHENSIVE METABOLIC PANEL     Status: Abnormal   Collection Time   10/21/11  6:48 AM      Component Value Range   Sodium 135  135 - 145 (mEq/L)     Potassium 4.0  3.5 - 5.1 (mEq/L)   Chloride 100  96 - 112 (mEq/L)   CO2 25  19 - 32 (mEq/L)   Glucose, Bld 116 (*) 70 - 99 (mg/dL)   BUN 54 (*) 6 - 23 (mg/dL)   Creatinine, Ser 1.73 (*) 0.50 - 1.35 (mg/dL)   Calcium 7.9 (*) 8.4 - 10.5 (mg/dL)   Total Protein 6.2  6.0 - 8.3 (g/dL)   Albumin 2.5 (*) 3.5 - 5.2 (g/dL)   AST 50 (*) 0 - 37 (U/L)   ALT 28  0 - 53 (U/L)   Alkaline Phosphatase 79  39 - 117 (U/L)   Total Bilirubin 2.8 (*) 0.3 - 1.2 (mg/dL)   GFR calc non Af Amer 43 (*) >90 (mL/min)   GFR calc Af Amer 50 (*) >90 (mL/min)  CBC     Status: Abnormal   Collection Time   10/21/11  6:48 AM      Component Value Range   WBC 14.6 (*) 4.0 - 10.5 (K/uL)   RBC 2.64 (*) 4.22 - 5.81 (MIL/uL)   Hemoglobin 8.5 (*) 13.0 - 17.0 (g/dL)   HCT 23.3 (*) 39.0 - 52.0 (%)   MCV 88.3  78.0 - 100.0 (fL)   MCH 32.2  26.0 - 34.0 (pg)   MCHC 36.5 (*) 30.0 - 36.0 (g/dL)   RDW 19.2 (*) 11.5 - 15.5 (%)   Platelets 168  150 - 400 (K/uL)    Studies/Results: No results found.  Medications:    . allopurinol  200 mg Oral Daily  . antiseptic oral rinse  15 mL Mouth Rinse BID  . aspirin EC  81 mg Oral Daily  . carvedilol  6.25 mg Oral BID WC  . digoxin  0.125 mg Oral Daily  . enoxaparin  40 mg Subcutaneous Q24H  . lisinopril  10 mg Oral BID  . predniSONE  60 mg Oral Q breakfast  . DISCONTD: colchicine  0.6 mg Oral BID  . DISCONTD: enoxaparin  30 mg Subcutaneous Q24H  . DISCONTD: furosemide  40 mg Intravenous Q12H  . DISCONTD: furosemide  40 mg Oral Daily  . DISCONTD: lisinopril  10 mg Oral Daily  . DISCONTD: sodium chloride  3 mL Intravenous Q12H    acetaminophen, diphenhydrAMINE, guaiFENesin-dextromethorphan, HYDROmorphone (DILAUDID) injection, ondansetron (ZOFRAN) IV, oxyCODONE-acetaminophen, DISCONTD: sodium chloride, DISCONTD:  HYDROmorphone (DILAUDID) injection, DISCONTD: ondansetron, DISCONTD: sodium chloride     Assessment/Plan:  *Acute on chronic combined systolic and diastolic CHF  (congestive heart failure)  - EF 40% in 05/2011 ,repeat echo showed EF 30-355.NEG CARDIAC MARKERS X3  -greatly appreciated Heart failure team input  -Labs showed worsening renal function, Lasix was held. Would also hold lisinopril. Continue Coreg. - daily weight , strict I&Os  Active Problems:  HTN (hypertension)  - controlled .continue coreg , holding lisinopril because of AKI. Continue to monitor and adjust Coreg dose if needed. Anemia of chronic disease /sickle cell disease  - Hgb 6.7 on admission , LDH 838 with total bilirubin of 2.8 consistent with hemolysis. Hemoglobin stabilized at 8.5 - patient received 2 units PRBC blood transfusion ,stable  Sickle cell crisis versus acute gouty flare  -Unclear as if symptoms were simply secondary to gout flare rather than sickle cell crisis.  - Taper IV Dilaudid down  -Patient denies any history of gout , uric acid level was found to be 15  -Left upper extremity pain is improving, Continue prednisone, colchicine was discontinued. -LUE pain doppler sono was negative for DVT  Leukocytosis  -  probably secondary to sickle cell and now worsening because of steroids. - no clear source of infection as patient is afebrile with no evidence of pneumonia on CXR  - urinalysis is normal  CKD (chronic kidney disease), stage III  -Worsening renal function with diuresis noted , will hold Lasix and lisinopril as above. Acute respiratory failure with hypoxia  - perhaps secondary to CHF exacerbation or anemia  - V/Q scan shows low probability for pulmonary embolism  -improving , titrate O2 down  DVT Prophylaxis - lovenox subQ  Code Status - full code  Disposition  To home possibly in one to 2 days.         LOS: 4 days   Zanobia Griebel 10/21/2011, 8:57 AM

## 2011-10-22 DIAGNOSIS — M109 Gout, unspecified: Secondary | ICD-10-CM

## 2011-10-22 DIAGNOSIS — D57819 Other sickle-cell disorders with crisis, unspecified: Secondary | ICD-10-CM

## 2011-10-22 DIAGNOSIS — N183 Chronic kidney disease, stage 3 unspecified: Secondary | ICD-10-CM

## 2011-10-22 DIAGNOSIS — R112 Nausea with vomiting, unspecified: Secondary | ICD-10-CM

## 2011-10-22 DIAGNOSIS — I5041 Acute combined systolic (congestive) and diastolic (congestive) heart failure: Secondary | ICD-10-CM

## 2011-10-22 LAB — CBC
HCT: 20.7 % — ABNORMAL LOW (ref 39.0–52.0)
Hemoglobin: 7.5 g/dL — ABNORMAL LOW (ref 13.0–17.0)
MCH: 31.8 pg (ref 26.0–34.0)
MCHC: 36.2 g/dL — ABNORMAL HIGH (ref 30.0–36.0)
MCV: 87.7 fL (ref 78.0–100.0)
Platelets: 165 10*3/uL (ref 150–400)
RBC: 2.36 MIL/uL — ABNORMAL LOW (ref 4.22–5.81)
RDW: 19.5 % — ABNORMAL HIGH (ref 11.5–15.5)
WBC: 13.2 10*3/uL — ABNORMAL HIGH (ref 4.0–10.5)

## 2011-10-22 LAB — BASIC METABOLIC PANEL
BUN: 58 mg/dL — ABNORMAL HIGH (ref 6–23)
CO2: 25 mEq/L (ref 19–32)
Calcium: 8.6 mg/dL (ref 8.4–10.5)
Chloride: 103 mEq/L (ref 96–112)
Creatinine, Ser: 1.44 mg/dL — ABNORMAL HIGH (ref 0.50–1.35)
GFR calc Af Amer: 63 mL/min — ABNORMAL LOW (ref >90)
GFR calc non Af Amer: 54 mL/min — ABNORMAL LOW (ref >90)
Glucose, Bld: 88 mg/dL (ref 70–99)
Potassium: 4.2 mEq/L (ref 3.5–5.1)
Sodium: 138 mEq/L (ref 135–145)

## 2011-10-22 LAB — LACTATE DEHYDROGENASE: LDH: 800 U/L — ABNORMAL HIGH (ref 94–250)

## 2011-10-22 MED ORDER — LISINOPRIL 10 MG PO TABS
10.0000 mg | ORAL_TABLET | Freq: Two times a day (BID) | ORAL | Status: DC
  Administered 2011-10-22 – 2011-10-24 (×5): 10 mg via ORAL
  Filled 2011-10-22 (×6): qty 1

## 2011-10-22 MED ORDER — FUROSEMIDE 40 MG PO TABS
40.0000 mg | ORAL_TABLET | Freq: Every day | ORAL | Status: DC
  Administered 2011-10-22 – 2011-10-24 (×3): 40 mg via ORAL
  Filled 2011-10-22 (×3): qty 1

## 2011-10-22 NOTE — Progress Notes (Addendum)
Advanced Heart Failure Rounding Note   Subjective:    Feels better.  Ambulated this weekend without difficulty.  No orthopnea/PND.  Wrist improved but sore to move fingers.    Cr improving 1.73->1.44  (lasix on hold) Hgb 8.5->7.5  Objective:   Weight Range:  Vital Signs:   Temp:  [98 F (36.7 C)-98.4 F (36.9 C)] 98.4 F (36.9 C) (05/06 0454) Pulse Rate:  [69-78] 70  (05/06 0816) Resp:  [19-20] 20  (05/06 0454) BP: (91-122)/(48-72) 120/70 mmHg (05/06 0816) SpO2:  [95 %-100 %] 99 % (05/06 0454) Weight:  [63 kg (138 lb 14.2 oz)] 63 kg (138 lb 14.2 oz) (05/06 0454) Last BM Date: 10/21/11  Weight change: Filed Weights   10/20/11 0521 10/21/11 0452 10/22/11 0454  Weight: 62.959 kg (138 lb 12.8 oz) 63.7 kg (140 lb 6.9 oz) 63 kg (138 lb 14.2 oz)    Intake/Output:   Intake/Output Summary (Last 24 hours) at 10/22/11 0821 Last data filed at 10/22/11 0810  Gross per 24 hour  Intake   1560 ml  Output   2470 ml  Net   -910 ml     Physical Exam: General: WDWN, No respiratory difficulty  HEENT: normal  Neck: supple. JVP 5,  Carotids 2+ bilat; no bruits. No lymphadenopathy or thryomegaly appreciated.  Cor: PMI laterally displaced. Regular. 3/6 MR  Lungs: clear  Abdomen: soft, nontender . No bruits or masses. Good bowel sounds.  Extremities: no cyanosis, + clubbing, no rash, trace edema.  Neuro: alert & oriented x 3, cranial nerves grossly intact. moves all 4 extremities w/o difficulty. Affect pleasant.   Telemetry: NSR 70s  Labs: Basic Metabolic Panel:  Lab [id number] 10/21/11 0648 10/20/11 0940 10/20/11 0530 10/19/11 0606 10/17/11 2253  NA 138 135 134* 136 137 --  K 4.2 4.0 4.2 4.0 4.2 --  CL 103 100 100 101 105 --  CO2 25 25 25 25 22  --  GLUCOSE 88 116* 205* 98 96 --  BUN 58* 54* 38* 35* 29* --  CREATININE 1.44* 1.73* 1.61* 1.40* 1.32 --  CALCIUM 8.6 7.9* 8.2* -- -- --  MG -- -- -- -- -- 2.0  PHOS -- -- -- -- -- --    Liver Function Tests:  Lab 10/21/11  0648 10/17/11 2253 10/17/11 0947  AST 50* 63* 65*  ALT 28 25 25   ALKPHOS 79 102 120*  BILITOT 2.8* 3.4* 3.3*  PROT 6.2 6.4 6.1  ALBUMIN 2.5* 2.9* 2.9*   No results found for this basename: LIPASE:5,AMYLASE:5 in the last 168 hours No results found for this basename: AMMONIA:3 in the last 168 hours  CBC:  Lab 10/22/11 0640 10/21/11 0648 10/20/11 0530 10/19/11 0606 10/17/11 2253 10/17/11 0947  WBC 13.2* 14.6* 13.7* 13.2* 11.6* --  NEUTROABS -- -- -- -- 7.6 8.2*  HGB 7.5* 8.5* 9.6* 8.8* 8.3* --  HCT 20.7* 23.3* 26.7* 24.2* 22.9* --  MCV 87.7 88.3 90.8 89.3 90.5 --  PLT 165 168 177 191 186 --    Cardiac Enzymes:  Lab 10/18/11 1529 10/18/11 0645 10/17/11 2254  CKTOTAL 51 59 87  CKMB 1.7 2.0 2.2  CKMBINDEX -- -- --  TROPONINI <0.30 <0.30 <0.30    BNP: BNP (last 3 results)  Basename 10/17/11 1149 06/08/11 0625 06/07/11 1604  PROBNP 7276.0* 5459.0* 5641.0*     Other results:  Imaging: No results found.   Medications:     Scheduled Medications:    . allopurinol  200 mg Oral Daily  .  antiseptic oral rinse  15 mL Mouth Rinse BID  . aspirin EC  81 mg Oral Daily  . carvedilol  6.25 mg Oral BID WC  . digoxin  0.125 mg Oral Daily  . enoxaparin  40 mg Subcutaneous Q24H  . predniSONE  60 mg Oral Q breakfast  . DISCONTD: furosemide  40 mg Oral Daily  . DISCONTD: lisinopril  10 mg Oral BID    Infusions:    PRN Medications: acetaminophen, diphenhydrAMINE, guaiFENesin-dextromethorphan, HYDROmorphone (DILAUDID) injection, ondansetron (ZOFRAN) IV, oxyCODONE-acetaminophen   Assessment:   1) Acute on chronic systolic HF      -EF 99991111  2) Sickle cell anemia  3) Acute renal failure, improving 4) Tobacco use  5) ETOH use 6) Probable gout L wrist     -uric acid 15.0 7) NSVT  Plan/Discussion:    Mr. Hellwig is euvolemic on exam, lasix held yesterday due to Cr climbing.  Currently renal function has improved.  From a HF standpoint he is stable for discharge but  Hgb has dropped to 7.5.  Dr. Urbano Heir is going to contact Dr. Marlou Sa with Sickle Cell Team to evaluate the patient, he may be able to go home tomorrow.  Will plan to restart lasix today.  Have discussed use of sliding scale lasix at home, he voices understanding.  Restart lisinopril.    Length of Stay: 5  Teressa Senter 10/22/2011, 8:21 AM  Patient seen and examined with Leone Haven, PA-C. We discussed all aspects of the encounter. I agree with the   assessment and plan as stated above. Doing well. Stable for d/c from HF standpoint on current meds including   lisinopril 10 bid lasix 40 daily.  carvedilol 6.25 bid Digoxin 0.125 dail  We will sign off. F/u in clinic on Monday. Will refer to EP for ICD.  Kaisy Severino,MD 9:16 AM

## 2011-10-22 NOTE — Progress Notes (Signed)
Utilization review completed. Lizabeth Leyden RN, CCM

## 2011-10-22 NOTE — Progress Notes (Signed)
Subjective: Patient seen and examined ,denies any complaints   Objective: Vital signs in last 24 hours: Temp:  [98 F (36.7 C)-98.4 F (36.9 C)] 98.4 F (36.9 C) (05/06 0454) Pulse Rate:  [69-78] 70  (05/06 0816) Resp:  [19-20] 20  (05/06 0454) BP: (91-122)/(48-72) 120/70 mmHg (05/06 0816) SpO2:  [95 %-100 %] 99 % (05/06 0454) Weight:  [63 kg (138 lb 14.2 oz)] 63 kg (138 lb 14.2 oz) (05/06 0454) Weight change: -0.7 kg (-1 lb 8.7 oz) Last BM Date: 10/21/11  Intake/Output from previous day: 05/05 0701 - 05/06 0700 In: 1320 [P.O.:1320] Out: 2470 [Urine:2470] Total I/O In: 240 [P.O.:240] Out: -    Physical Exam: General: Alert, awake, oriented x3, in no acute distress.  HEENT: No bruits, no goiter.  Heart: Regular rate and rhythm, without murmurs, rubs, gallops.  Lungs: Clear to auscultation bilaterally.  Abdomen: Soft, nontender, nondistended, positive bowel sounds.  Extremities: No clubbing cyanosis or edema with positive pedal pulses. Left upper extremity swelling resolved and with full range of motion.  Neuro: Grossly intact, nonfocal.     Lab Results: Results for orders placed during the hospital encounter of 10/17/11 (from the past 24 hour(s))  BASIC METABOLIC PANEL     Status: Abnormal   Collection Time   10/22/11  6:40 AM      Component Value Range   Sodium 138  135 - 145 (mEq/L)   Potassium 4.2  3.5 - 5.1 (mEq/L)   Chloride 103  96 - 112 (mEq/L)   CO2 25  19 - 32 (mEq/L)   Glucose, Bld 88  70 - 99 (mg/dL)   BUN 58 (*) 6 - 23 (mg/dL)   Creatinine, Ser 1.44 (*) 0.50 - 1.35 (mg/dL)   Calcium 8.6  8.4 - 10.5 (mg/dL)   GFR calc non Af Amer 54 (*) >90 (mL/min)   GFR calc Af Amer 63 (*) >90 (mL/min)  CBC     Status: Abnormal   Collection Time   10/22/11  6:40 AM      Component Value Range   WBC 13.2 (*) 4.0 - 10.5 (K/uL)   RBC 2.36 (*) 4.22 - 5.81 (MIL/uL)   Hemoglobin 7.5 (*) 13.0 - 17.0 (g/dL)   HCT 20.7 (*) 39.0 - 52.0 (%)   MCV 87.7  78.0 - 100.0 (fL)   MCH 31.8  26.0 - 34.0 (pg)   MCHC 36.2 (*) 30.0 - 36.0 (g/dL)   RDW 19.5 (*) 11.5 - 15.5 (%)   Platelets 165  150 - 400 (K/uL)  LACTATE DEHYDROGENASE     Status: Abnormal   Collection Time   10/22/11  6:40 AM      Component Value Range   LDH 800 (*) 94 - 250 (U/L)    Studies/Results: No results found.  Medications:    . allopurinol  200 mg Oral Daily  . antiseptic oral rinse  15 mL Mouth Rinse BID  . aspirin EC  81 mg Oral Daily  . carvedilol  6.25 mg Oral BID WC  . digoxin  0.125 mg Oral Daily  . enoxaparin  40 mg Subcutaneous Q24H  . lisinopril  10 mg Oral BID  . predniSONE  60 mg Oral Q breakfast  . DISCONTD: furosemide  40 mg Oral Daily  . DISCONTD: lisinopril  10 mg Oral BID    acetaminophen, diphenhydrAMINE, guaiFENesin-dextromethorphan, HYDROmorphone (DILAUDID) injection, ondansetron (ZOFRAN) IV, oxyCODONE-acetaminophen     Assessment/Plan:  *Acute on chronic combined systolic and diastolic CHF (congestive heart failure)  -  EF 40% in 05/2011 ,repeat echo showed EF 30-355.NEG CARDIAC MARKERS X3  -greatly appreciated Heart failure team input  - Continue Lasix, lisinopril , digoxin and Coreg. Followup with EP for ICD as an outpatient as per cardiology. - daily weight , strict I&Os  Active Problems:  HTN (hypertension)  - controlled .continue coreg ,lisinopril  Anemia of chronic disease /sickle cell disease  - Hgb 6.7 on admission , LDH 838 with total bilirubin of 2.8 consistent with hemolysis. Hemoglobin  dropped to 7.5 today  - patient received 2 units PRBC blood transfusion  on admission. Dr. Marlou Sa was consulted  Left upper extremity pain and swelling  -Unclear as if symptoms were simply secondary to gout flare rather than sickle cell crisis.  - Taper IV Dilaudid down  -Patient denies any history of gout , uric acid level was found to be 15  -Left upper extremity pain is improving, Continue prednisone, colchicine was discontinued.  -LUE pain doppler sono was  negative for DVT  Leukocytosis  - probably secondary to sickle cell and now worsening because of steroids.  - no clear source of infection as patient is afebrile with no evidence of pneumonia on CXR  - urinalysis is normal  CKD (chronic kidney disease), stage III  -Worsening renal function with diuresis noted , now improving  Acute respiratory failure with hypoxia , resolved  - perhaps secondary to CHF exacerbation or anemia  - V/Q scan shows low probability for pulmonary embolism  DVT Prophylaxis - lovenox subQ  Code Status - full code  Disposition  To home possibly in one to 2 days.     LOS: 5 days   Malaiya Paczkowski 10/22/2011, 8:40 AM

## 2011-10-23 ENCOUNTER — Encounter (HOSPITAL_COMMUNITY): Payer: Self-pay | Admitting: *Deleted

## 2011-10-23 DIAGNOSIS — R112 Nausea with vomiting, unspecified: Secondary | ICD-10-CM

## 2011-10-23 DIAGNOSIS — D57819 Other sickle-cell disorders with crisis, unspecified: Secondary | ICD-10-CM

## 2011-10-23 DIAGNOSIS — M109 Gout, unspecified: Secondary | ICD-10-CM

## 2011-10-23 DIAGNOSIS — I5041 Acute combined systolic (congestive) and diastolic (congestive) heart failure: Secondary | ICD-10-CM

## 2011-10-23 LAB — CBC
HCT: 20.2 % — ABNORMAL LOW (ref 39.0–52.0)
Hemoglobin: 7 g/dL — ABNORMAL LOW (ref 13.0–17.0)
MCH: 31 pg (ref 26.0–34.0)
MCHC: 34.7 g/dL (ref 30.0–36.0)
MCV: 89.4 fL (ref 78.0–100.0)
Platelets: 182 10*3/uL (ref 150–400)
RBC: 2.26 MIL/uL — ABNORMAL LOW (ref 4.22–5.81)
RDW: 19.9 % — ABNORMAL HIGH (ref 11.5–15.5)
WBC: 11.6 10*3/uL — ABNORMAL HIGH (ref 4.0–10.5)

## 2011-10-23 LAB — COMPREHENSIVE METABOLIC PANEL
ALT: 19 U/L (ref 0–53)
AST: 38 U/L — ABNORMAL HIGH (ref 0–37)
Albumin: 2.6 g/dL — ABNORMAL LOW (ref 3.5–5.2)
Alkaline Phosphatase: 79 U/L (ref 39–117)
BUN: 61 mg/dL — ABNORMAL HIGH (ref 6–23)
CO2: 25 mEq/L (ref 19–32)
Calcium: 8.7 mg/dL (ref 8.4–10.5)
Chloride: 102 mEq/L (ref 96–112)
Creatinine, Ser: 1.35 mg/dL (ref 0.50–1.35)
GFR calc Af Amer: 68 mL/min — ABNORMAL LOW (ref >90)
GFR calc non Af Amer: 58 mL/min — ABNORMAL LOW (ref >90)
Glucose, Bld: 83 mg/dL (ref 70–99)
Potassium: 4.2 mEq/L (ref 3.5–5.1)
Sodium: 136 mEq/L (ref 135–145)
Total Bilirubin: 3 mg/dL — ABNORMAL HIGH (ref 0.3–1.2)
Total Protein: 6.4 g/dL (ref 6.0–8.3)

## 2011-10-23 LAB — RETICULOCYTES
RBC.: 2.26 MIL/uL — ABNORMAL LOW (ref 4.22–5.81)
Retic Count, Absolute: 160.5 10*3/uL (ref 19.0–186.0)
Retic Ct Pct: 7.1 % — ABNORMAL HIGH (ref 0.4–3.1)

## 2011-10-23 LAB — FERRITIN: Ferritin: 1118 ng/mL — ABNORMAL HIGH (ref 22–322)

## 2011-10-23 LAB — LACTATE DEHYDROGENASE: LDH: 772 U/L — ABNORMAL HIGH (ref 94–250)

## 2011-10-23 MED ORDER — HYDROMORPHONE HCL PF 1 MG/ML IJ SOLN
2.0000 mg | Freq: Two times a day (BID) | INTRAMUSCULAR | Status: DC | PRN
  Administered 2011-10-24: 2 mg via INTRAVENOUS
  Filled 2011-10-23: qty 2

## 2011-10-23 MED ORDER — PREDNISONE 20 MG PO TABS: 20.0000 mg | ORAL_TABLET | Freq: Every day | ORAL | Status: DC

## 2011-10-23 MED ORDER — PREDNISONE 10 MG PO TABS: 10.0000 mg | ORAL_TABLET | Freq: Every day | ORAL | Status: DC

## 2011-10-23 MED ORDER — PREDNISONE 20 MG PO TABS
40.0000 mg | ORAL_TABLET | Freq: Every day | ORAL | Status: DC
  Administered 2011-10-23 – 2011-10-24 (×2): 40 mg via ORAL
  Filled 2011-10-23 (×3): qty 2

## 2011-10-23 NOTE — Consult Note (Signed)
Reason for Consult: Referring Physician:   ARSLAN COPUS is an 56 y.o. male.  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 He has been followed by Dr. Criss Rosales for over 20 years. He notes that he normally does not have frequent crises. His last sickle cell crisis was approximately 7 years ago. Patient came in at this time with left arm pain with swelling and increased warmth. He did have elevated uric acid level was suggestive of gout. He also had signs of active hemolysis as well. He is presently feeling better at this time. He rates his pain as a 4/10. He notes his pain at the present level is manageable at home. He notes that in the past his crisis is normally been in his back and lower legs. He himself does not feel this is consistent with a sickle cell crisis.  Past no history is noted. He is not aware of his normal hemoglobin value. He has not required a transfusion in recent times.  Past Medical History  Diagnosis Date  . Shortness of breath   . Anemia     sickle cell anemia  . Headache   . Swelling of extremity, right   . Swelling abdomen   . Swelling of extremity, left   . Sickle cell anemia   . CHF (congestive heart failure)   . Blood dyscrasia     SICKLE CELL   . Blood transfusion     " NO REACTION TO TRANSFUSION"    Past Surgical History  Procedure Date  . No past surgeries     Family History  Problem Relation Age of Onset  . Alcohol abuse Mother   . Liver disease Mother   . Cirrhosis Mother     Social History:  reports that he has been smoking Cigarettes.  He has a 12.5 pack-year smoking history. He has never used smokeless tobacco. He reports that he drinks about 4.2 ounces of alcohol per week. He reports that he does not use illicit drugs.  Allergies: No Known Allergies  ROS: As noted above. He has not had a cholecystectomy. Patient does not smoke or drink. Current Facility-Administered Medications    Medication Dose Route Frequency Provider Last Rate Last Dose  . acetaminophen (TYLENOL) tablet 650 mg  650 mg Oral Q4H PRN Robbie Lis, MD   650 mg at 10/18/11 2001  . allopurinol (ZYLOPRIM) tablet 200 mg  200 mg Oral Daily Silvestre Moment, MD   200 mg at 10/22/11 1045  . antiseptic oral rinse (BIOTENE) solution 15 mL  15 mL Mouth Rinse BID Robbie Lis, MD   15 mL at 10/23/11 0800  . aspirin EC tablet 81 mg  81 mg Oral Daily Robbie Lis, MD   81 mg at 10/22/11 1045  . carvedilol (COREG) tablet 6.25 mg  6.25 mg Oral BID WC Jolaine Artist, MD   6.25 mg at 10/23/11 0900  . digoxin (LANOXIN) tablet 0.125 mg  0.125 mg Oral Daily Robbie Lis, MD   0.125 mg at 10/22/11 1046  . diphenhydrAMINE (BENADRYL) injection 12.5 mg  12.5 mg Intravenous Q8H PRN Robbie Lis, MD      . enoxaparin (LOVENOX) injection 40 mg  40 mg Subcutaneous Q24H Jolaine Artist, MD   40 mg at 10/22/11 1700  . furosemide (LASIX) tablet 40 mg  40 mg Oral Daily Wynetta Emery, PA   40 mg at 10/22/11 1046  .  guaiFENesin-dextromethorphan (ROBITUSSIN DM) 100-10 MG/5ML syrup 5 mL  5 mL Oral Q4H PRN Robbie Lis, MD   5 mL at 10/18/11 1201  . HYDROmorphone (DILAUDID) injection 2 mg  2 mg Intravenous Q8H PRN Silvestre Moment, MD   2 mg at 10/22/11 2309  . lisinopril (PRINIVIL,ZESTRIL) tablet 10 mg  10 mg Oral BID Wynetta Emery, PA   10 mg at 10/22/11 2202  . ondansetron (ZOFRAN) injection 4 mg  4 mg Intravenous Q6H PRN Robbie Lis, MD      . oxyCODONE-acetaminophen [facility]) 5-325 MG per tablet 2 tablet  2 tablet Oral Q6H PRN Rande Brunt, NP   2 tablet at 10/23/11 0501    Results for orders placed during the hospital encounter of 10/17/11 (from the past 48 hour(s))  BASIC METABOLIC PANEL     Status: Abnormal   Collection Time   10/22/11  6:40 AM      Component Value Range Comment   Sodium 138  135 - 145 (mEq/L)    Potassium 4.2  3.5 - 5.1 (mEq/L)    Chloride 103  96 - 112 (mEq/L)    CO2 25  19 - 32 (mEq/L)     Glucose, Bld 88  70 - 99 (mg/dL)    BUN 58 (*) 6 - 23 (mg/dL)    Creatinine, Ser 1.44 (*) 0.50 - 1.35 (mg/dL)    Calcium 8.6  8.4 - 10.5 (mg/dL)    GFR calc non Af Amer 54 (*) >90 (mL/min)    GFR calc Af Amer 63 (*) >90 (mL/min)   CBC     Status: Abnormal   Collection Time   10/22/11  6:40 AM      Component Value Range Comment   WBC 13.2 (*) 4.0 - 10.5 (K/uL)    RBC 2.36 (*) 4.22 - 5.81 (MIL/uL)    Hemoglobin 7.5 (*) 13.0 - 17.0 (g/dL)    HCT 20.7 (*) 39.0 - 52.0 (%)    MCV 87.7  78.0 - 100.0 (fL)    MCH 31.8  26.0 - 34.0 (pg)    MCHC 36.2 (*) 30.0 - 36.0 (g/dL)    RDW 19.5 (*) 11.5 - 15.5 (%)    Platelets 165  150 - 400 (K/uL)   LACTATE DEHYDROGENASE     Status: Abnormal   Collection Time   10/22/11  6:40 AM      Component Value Range Comment   LDH 800 (*) 94 - 250 (U/L)   CBC     Status: Abnormal   Collection Time   10/23/11  4:55 AM      Component Value Range Comment   WBC 11.6 (*) 4.0 - 10.5 (K/uL) WHITE COUNT CONFIRMED ON SMEAR   RBC 2.26 (*) 4.22 - 5.81 (MIL/uL)    Hemoglobin 7.0 (*) 13.0 - 17.0 (g/dL)    HCT 20.2 (*) 39.0 - 52.0 (%)    MCV 89.4  78.0 - 100.0 (fL)    MCH 31.0  26.0 - 34.0 (pg)    MCHC 34.7  30.0 - 36.0 (g/dL)    RDW 19.9 (*) 11.5 - 15.5 (%)    Platelets 182  150 - 400 (K/uL) PLATELET COUNT CONFIRMED BY SMEAR  RETICULOCYTES     Status: Abnormal   Collection Time   10/23/11  4:55 AM      Component Value Range Comment   Retic Ct Pct 7.1 (*) 0.4 - 3.1 (%)    RBC. 2.26 (*) 4.22 - 5.81 (  MIL/uL)    Retic Count, Manual 160.5  19.0 - 186.0 (K/uL)   COMPREHENSIVE METABOLIC PANEL     Status: Abnormal   Collection Time   10/23/11  4:55 AM      Component Value Range Comment   Sodium 136  135 - 145 (mEq/L)    Potassium 4.2  3.5 - 5.1 (mEq/L)    Chloride 102  96 - 112 (mEq/L)    CO2 25  19 - 32 (mEq/L)    Glucose, Bld 83  70 - 99 (mg/dL)    BUN 61 (*) 6 - 23 (mg/dL)    Creatinine, Ser 1.35  0.50 - 1.35 (mg/dL)    Calcium 8.7  8.4 - 10.5 (mg/dL)    Total  Protein 6.4  6.0 - 8.3 (g/dL)    Albumin 2.6 (*) 3.5 - 5.2 (g/dL)    AST 38 (*) 0 - 37 (U/L)    ALT 19  0 - 53 (U/L)    Alkaline Phosphatase 79  39 - 117 (U/L)    Total Bilirubin 3.0 (*) 0.3 - 1.2 (mg/dL)    GFR calc non Af Amer 58 (*) >90 (mL/min)    GFR calc Af Amer 68 (*) >90 (mL/min)   LACTATE DEHYDROGENASE     Status: Abnormal   Collection Time   10/23/11  4:55 AM      Component Value Range Comment   LDH 772 (*) 94 - 250 (U/L)     No results found. Blood pressure 137/80, pulse 74, temperature 98.1 F (36.7 C), temperature source Oral, resp. rate 18, height 5\' 11"  (1.803 m), weight 138 lb 14.4 oz (63.005 kg), SpO2 96.00%.  Well-developed well-nourished black male presently in no acute distress. HEENT: No sinus tenderness. Muscular icterus. NECK: No enlarged thyroid. No posterior cervical nodes. LUNGS: Clear to auscultation. No vocal fremitus. CV: Normal S1, S2 without S3. MSK: Minimal tenderness in the left wrist. Presently without significant erythema edema or increased warmth. Negative Homans.  Assessment/Plan: Hemoglobin SS disease. Atypical crisis with active hemolysis. Gradually resolving. Hyperuricemia. This can also be aggravated by sickle cell disease. Left arm pain with swelling suggestive of gout. This is improving. Elevated LDH and bilirubin consistent with hemolysis. This is gradually resolving.  RECOMMEND: Continue conservative treatment. Would not transfuse at this time unless symptomatic. Treat  hyperuricemia as you're doing. Folate 1 mg daily. We can followup in the sickle cell unit with repeat CBC uric acid. Have ordered baseline ferritin hemoglobin electrophroesis as well.  Keishawn Rajewski 10/23/2011, 9:32 AM

## 2011-10-23 NOTE — Progress Notes (Signed)
Subjective: Patient seen and examined, feeling better however still having left upper extremity pain.  Objective: Vital signs in last 24 hours: Temp:  [97.8 F (36.6 C)-98.1 F (36.7 C)] 98.1 F (36.7 C) (05/07 0900) Pulse Rate:  [65-74] 74  (05/07 0900) Resp:  [18] 18  (05/07 0900) BP: (112-137)/(65-80) 137/80 mmHg (05/07 0900) SpO2:  [95 %-96 %] 96 % (05/07 0900) Weight:  [63.005 kg (138 lb 14.4 oz)] 63.005 kg (138 lb 14.4 oz) (05/07 0515) Weight change: 0.005 kg (0.2 oz) Last BM Date: 10/23/11  Intake/Output from previous day: 05/06 0701 - 05/07 0700 In: 1440 [P.O.:1440] Out: 2800 [Urine:2800] Total I/O In: 240 [P.O.:240] Out: -    Physical Exam: General: Alert, awake, oriented x3, in no acute distress.  HEENT: No bruits, no goiter.  Heart: Regular rate and rhythm, without murmurs, rubs, gallops.  Lungs: Clear to auscultation bilaterally.  Abdomen: Soft, nontender, nondistended, positive bowel sounds.  Extremities: No clubbing cyanosis or edema with positive pedal pulses. Left upper extremity swelling resolving , range of motion slightly limited .  Neuro: Grossly intact, nonfocal.     Lab Results: Results for orders placed during the hospital encounter of 10/17/11 (from the past 24 hour(s))  CBC     Status: Abnormal   Collection Time   10/23/11  4:55 AM      Component Value Range   WBC 11.6 (*) 4.0 - 10.5 (K/uL)   RBC 2.26 (*) 4.22 - 5.81 (MIL/uL)   Hemoglobin 7.0 (*) 13.0 - 17.0 (g/dL)   HCT 20.2 (*) 39.0 - 52.0 (%)   MCV 89.4  78.0 - 100.0 (fL)   MCH 31.0  26.0 - 34.0 (pg)   MCHC 34.7  30.0 - 36.0 (g/dL)   RDW 19.9 (*) 11.5 - 15.5 (%)   Platelets 182  150 - 400 (K/uL)  RETICULOCYTES     Status: Abnormal   Collection Time   10/23/11  4:55 AM      Component Value Range   Retic Ct Pct 7.1 (*) 0.4 - 3.1 (%)   RBC. 2.26 (*) 4.22 - 5.81 (MIL/uL)   Retic Count, Manual 160.5  19.0 - 186.0 (K/uL)  COMPREHENSIVE METABOLIC PANEL     Status: Abnormal   Collection  Time   10/23/11  4:55 AM      Component Value Range   Sodium 136  135 - 145 (mEq/L)   Potassium 4.2  3.5 - 5.1 (mEq/L)   Chloride 102  96 - 112 (mEq/L)   CO2 25  19 - 32 (mEq/L)   Glucose, Bld 83  70 - 99 (mg/dL)   BUN 61 (*) 6 - 23 (mg/dL)   Creatinine, Ser 1.35  0.50 - 1.35 (mg/dL)   Calcium 8.7  8.4 - 10.5 (mg/dL)   Total Protein 6.4  6.0 - 8.3 (g/dL)   Albumin 2.6 (*) 3.5 - 5.2 (g/dL)   AST 38 (*) 0 - 37 (U/L)   ALT 19  0 - 53 (U/L)   Alkaline Phosphatase 79  39 - 117 (U/L)   Total Bilirubin 3.0 (*) 0.3 - 1.2 (mg/dL)   GFR calc non Af Amer 58 (*) >90 (mL/min)   GFR calc Af Amer 68 (*) >90 (mL/min)  FERRITIN     Status: Abnormal   Collection Time   10/23/11  4:55 AM      Component Value Range   Ferritin 1118 (*) 22 - 322 (ng/mL)  LACTATE DEHYDROGENASE     Status: Abnormal  Collection Time   10/23/11  4:55 AM      Component Value Range   LDH 772 (*) 94 - 250 (U/L)    Studies/Results: No results found.  Medications:    . allopurinol  200 mg Oral Daily  . antiseptic oral rinse  15 mL Mouth Rinse BID  . aspirin EC  81 mg Oral Daily  . carvedilol  6.25 mg Oral BID WC  . digoxin  0.125 mg Oral Daily  . enoxaparin  40 mg Subcutaneous Q24H  . furosemide  40 mg Oral Daily  . lisinopril  10 mg Oral BID    acetaminophen, diphenhydrAMINE, guaiFENesin-dextromethorphan, HYDROmorphone (DILAUDID) injection, ondansetron (ZOFRAN) IV, oxyCODONE-acetaminophen     Assessment/Plan: *Acute on chronic combined systolic and diastolic CHF (congestive heart failure)  - EF 40% in 05/2011 ,repeat echo showed EF 30-35%.NEG CARDIAC MARKERS X3  -Patient was seen by Heart failure team and recommendations are to continue Lasix, lisinopril , digoxin and Coreg. Followup with EP for ICD as an outpatient .  - daily weight , strict I&Os  Active Problems:  HTN (hypertension)  - controlled .continue coreg ,lisinopril  Anemia of chronic disease /sickle cell disease  - Hgb 6.7 on admission,he received  2 units PRBC blood transfusion . LDH 838 with total bilirubin of 2.8 consistent with hemolysis. Hemoglobin continue to trend down to 7.0 today -  Dr. Marlou Sa was consulted  Left upper extremity pain and swelling  -Unclear as if symptoms were simply secondary to gout flare rather than sickle cell crisis.  -Patient denies any history of gout , uric acid level was found to be 15  -Left upper extremity pain is improving, Continue prednisone taper, -LUE pain doppler sono was negative for DVT  Leukocytosis  - probably secondary to sickle cell / steroids.  - no clear source of infection as patient is afebrile with no evidence of pneumonia on CXR  - urinalysis is normal  CKD (chronic kidney disease), stage III  -Worsening renal function with diuresis noted , now improving , elevated elevated BUN probably combination of prerenal azotemia secondary to diuretics and to the use of steroids Acute respiratory failure with hypoxia , resolved  - V/Q scan shows low probability for pulmonary embolism   DVT Prophylaxis - lovenox subQ  Code Status - full code  Disposition  To home when hemoglobin stabilizes.       LOS: 6 days   Patrick [name] 10/23/2011, 3:32 PM

## 2011-10-24 DIAGNOSIS — R112 Nausea with vomiting, unspecified: Secondary | ICD-10-CM

## 2011-10-24 DIAGNOSIS — I5041 Acute combined systolic (congestive) and diastolic (congestive) heart failure: Secondary | ICD-10-CM

## 2011-10-24 DIAGNOSIS — D57819 Other sickle-cell disorders with crisis, unspecified: Secondary | ICD-10-CM

## 2011-10-24 DIAGNOSIS — M109 Gout, unspecified: Secondary | ICD-10-CM

## 2011-10-24 LAB — CBC
HCT: 20.1 % — ABNORMAL LOW (ref 39.0–52.0)
Hemoglobin: 6.8 g/dL — CL (ref 13.0–17.0)
MCH: 30.2 pg (ref 26.0–34.0)
MCHC: 33.8 g/dL (ref 30.0–36.0)
MCV: 89.3 fL (ref 78.0–100.0)
Platelets: 197 10*3/uL (ref 150–400)
RBC: 2.25 MIL/uL — ABNORMAL LOW (ref 4.22–5.81)
RDW: 20.2 % — ABNORMAL HIGH (ref 11.5–15.5)
WBC: 10.5 10*3/uL (ref 4.0–10.5)

## 2011-10-24 LAB — BASIC METABOLIC PANEL
BUN: 61 mg/dL — ABNORMAL HIGH (ref 6–23)
CO2: 24 mEq/L (ref 19–32)
Calcium: 8.8 mg/dL (ref 8.4–10.5)
Chloride: 101 mEq/L (ref 96–112)
Creatinine, Ser: 1.57 mg/dL — ABNORMAL HIGH (ref 0.50–1.35)
GFR calc Af Amer: 56 mL/min — ABNORMAL LOW (ref >90)
GFR calc non Af Amer: 49 mL/min — ABNORMAL LOW (ref >90)
Glucose, Bld: 113 mg/dL — ABNORMAL HIGH (ref 70–99)
Potassium: 4.7 mEq/L (ref 3.5–5.1)
Sodium: 134 mEq/L — ABNORMAL LOW (ref 135–145)

## 2011-10-24 LAB — HEPATIC FUNCTION PANEL
ALT: 20 U/L (ref 0–53)
AST: 38 U/L — ABNORMAL HIGH (ref 0–37)
Albumin: 2.7 g/dL — ABNORMAL LOW (ref 3.5–5.2)
Alkaline Phosphatase: 79 U/L (ref 39–117)
Bilirubin, Direct: 0.4 mg/dL — ABNORMAL HIGH (ref 0.0–0.3)
Indirect Bilirubin: 2.1 mg/dL — ABNORMAL HIGH (ref 0.3–0.9)
Total Bilirubin: 2.5 mg/dL — ABNORMAL HIGH (ref 0.3–1.2)
Total Protein: 6.6 g/dL (ref 6.0–8.3)

## 2011-10-24 MED ORDER — FOLIC ACID 1 MG PO TABS
2.0000 mg | ORAL_TABLET | Freq: Every day | ORAL | Status: DC
  Filled 2011-10-24: qty 2

## 2011-10-24 MED ORDER — OXYCODONE-ACETAMINOPHEN 5-325 MG PO TABS: 2.0000 | ORAL_TABLET | Freq: Four times a day (QID) | ORAL | Status: AC | PRN

## 2011-10-24 MED ORDER — CARVEDILOL 6.25 MG PO TABS: 6.2500 mg | ORAL_TABLET | Freq: Two times a day (BID) | ORAL | Status: DC

## 2011-10-24 MED ORDER — ASPIRIN 81 MG PO TBEC: 81.0000 mg | DELAYED_RELEASE_TABLET | Freq: Every day | ORAL | Status: AC

## 2011-10-24 MED ORDER — PREDNISONE 10 MG PO TABS: ORAL_TABLET | ORAL | Status: DC

## 2011-10-24 MED ORDER — ALLOPURINOL 100 MG PO TABS: 100.0000 mg | ORAL_TABLET | Freq: Every day | ORAL | Status: DC

## 2011-10-24 MED ORDER — FOLIC ACID 1 MG PO TABS: 2.0000 mg | ORAL_TABLET | Freq: Every day | ORAL | Status: AC

## 2011-10-24 MED ORDER — FOLIC ACID 1 MG PO TABS: 1.0000 mg | ORAL_TABLET | Freq: Every day | ORAL | Status: DC

## 2011-10-24 NOTE — Progress Notes (Signed)
Patient's IV and telemetry has been discontinued; patient verbalizes understanding of discharge instructions and has not further questions_____________________________________________________________D. Owens Shark RN

## 2011-10-24 NOTE — Discharge Summary (Signed)
DISCHARGE SUMMARY  Patrick [name]  Patrick#: [id number]  DOB:Oct 07, 1955  Date of Admission: 10/17/2011 Date of Discharge: 10/24/2011  Attending Physician:Ruth Kovich  Patient's VB:[id number] J, MD, MD  Consults:Treatment Team:  Jolaine Artist, MD Kevan Ny, MD.  Discharge Diagnoses: Present on Admission:  .HTN (hypertension) .Anemia of chronic disease .Sickle cell crisis .Acute on chronic combined systolic and diastolic CHF (congestive heart failure) .Leukocytosis .CKD (chronic kidney disease), stage III .Acute respiratory failure with hypoxia   Hospital Course: Patrick [name] was admitted on 10/17/11 with SOB, found to be in sickle cell crisis, fluid overloaded with some renal insufficiency. This apparently his first sickle cell crisis in the last 7 years, ? Trigger. He is close to his baseline  Clinically, although his Hb is 6.8g/dl. Appreciate input by Dr Marlou Sa, Heart Failure team. He should continue follow up with Dr Criss Rosales, Heart Failure team, and Dr Marlou Sa. The heart failure team added coreg/asa. He is discharged in stable condition.   Medication List  As of 10/24/2011  1:31 PM   TAKE these medications         allopurinol 100 MG tablet   Commonly known as: ZYLOPRIM   Take 1 tablet (100 mg total) by mouth daily.      aspirin 81 MG EC tablet   Take 1 tablet (81 mg total) by mouth daily.      carvedilol 6.25 MG tablet   Commonly known as: COREG   Take 1 tablet (6.25 mg total) by mouth 2 (two) times daily with a meal.      digoxin 0.125 MG tablet   Commonly known as: LANOXIN   Take 0.125 mg by mouth daily.      folic acid 1 MG tablet   Commonly known as: FOLVITE   Take 2 tablets (2 mg total) by mouth daily.      furosemide 40 MG tablet   Commonly known as: LASIX   Take 40 mg by mouth daily.      lisinopril 10 MG tablet   Commonly known as: PRINIVIL,ZESTRIL   Take 10 mg by mouth daily.      oxyCODONE-acetaminophen 5-325 MG per tablet   Commonly known as:  PERCOCET   Take 2 tablets by mouth every 6 (six) hours as needed.      predniSONE 10 MG tablet   Commonly known as: DELTASONE   Take 4 tablets daily for 1 day, then 3 tablets daily for 2 days then 2 tablets daily for 2 days then 1 tablet daily for 2 days.             Day of Discharge BP 123/75  Pulse 66  Temp(Src) 98 F (36.7 C) (Oral)  Resp 18  Ht 5\' 11"  (1.803 m)  Wt 62.596 kg (138 lb)  BMI 19.25 kg/m2  SpO2 100%  Physical Exam: At baseline.  Results for orders placed during the hospital encounter of 10/17/11 (from the past 24 hour(s))  CBC     Status: Abnormal   Collection Time   10/24/11  6:17 AM      Component Value Range   WBC 10.5  4.0 - 10.5 (K/uL)   RBC 2.25 (*) 4.22 - 5.81 (MIL/uL)   Hemoglobin 6.8 (*) 13.0 - 17.0 (g/dL)   HCT 20.1 (*) 39.0 - 52.0 (%)   MCV 89.3  78.0 - 100.0 (fL)   MCH 30.2  26.0 - 34.0 (pg)   MCHC 33.8  30.0 - 36.0 (g/dL)   RDW 20.2 (*) 11.5 -  15.5 (%)   Platelets 197  150 - 400 (K/uL)  BASIC METABOLIC PANEL     Status: Abnormal   Collection Time   10/24/11  6:17 AM      Component Value Range   Sodium 134 (*) 135 - 145 (mEq/L)   Potassium 4.7  3.5 - 5.1 (mEq/L)   Chloride 101  96 - 112 (mEq/L)   CO2 24  19 - 32 (mEq/L)   Glucose, Bld 113 (*) 70 - 99 (mg/dL)   BUN 61 (*) 6 - 23 (mg/dL)   Creatinine, Ser 1.57 (*) 0.50 - 1.35 (mg/dL)   Calcium 8.8  8.4 - 10.5 (mg/dL)   GFR calc non Af Amer 49 (*) >90 (mL/min)   GFR calc Af Amer 56 (*) >90 (mL/min)  HEPATIC FUNCTION PANEL     Status: Abnormal   Collection Time   10/24/11  6:17 AM      Component Value Range   Total Protein 6.6  6.0 - 8.3 (g/dL)   Albumin 2.7 (*) 3.5 - 5.2 (g/dL)   AST 38 (*) 0 - 37 (U/L)   ALT 20  0 - 53 (U/L)   Alkaline Phosphatase 79  39 - 117 (U/L)   Total Bilirubin 2.5 (*) 0.3 - 1.2 (mg/dL)   Bilirubin, Direct 0.4 (*) 0.0 - 0.3 (mg/dL)   Indirect Bilirubin 2.1 (*) 0.3 - 0.9 (mg/dL)    Disposition: home today   Follow-up Appts: Discharge Orders    Future  Appointments: Provider: Department: Dept Phone: Center:   10/29/2011 3:30 PM [facility] [phone] None   11/15/2011 12:00 PM Evans Lance, [place] [phone] [id number]     Future Orders Please Complete By Expires   Diet - low sodium heart healthy      Increase activity slowly         Follow-up Information    Follow up with Glori Bickers, MD on 10/29/2011. (at 3:30p  [employer] Code [phone]))    Contact information:   [address] [place] [phone]       Follow up with Cristopher Peru, MD on 11/15/2011. (11:45)    Contact information:   [address] [place] [phone]          Tests Needing Follow-up: Cbc/bmp.  Time spent in discharge (includes decision making & examination of pt): 15 minutes  Signed: Seth Friedlander 10/24/2011, 1:31 PM

## 2011-10-24 NOTE — Progress Notes (Signed)
Critical Lab results received for patient; Hemoglobin is 6.8, MD notified and no orders given at this time; will continue to monitor patient. Patient is asymptomatic and states he feels fine_____________________D. Owens Shark RN

## 2011-10-25 LAB — HEMOGLOBINOPATHY EVALUATION
Hemoglobin Other: 0 %
Hgb A2 Quant: 2.8 % (ref 2.2–3.2)
Hgb A: 34 % — ABNORMAL LOW (ref 96.8–97.8)
Hgb F Quant: 12.5 % — ABNORMAL HIGH (ref 0.0–2.0)
Hgb S Quant: 50.7 % — ABNORMAL HIGH

## 2011-10-29 ENCOUNTER — Ambulatory Visit (HOSPITAL_COMMUNITY)
Admission: RE | Admit: 2011-10-29 | Discharge: 2011-10-29 | Disposition: A | Discharge status: Home / Self Care | Payer: PRIVATE HEALTH INSURANCE | Source: Ambulatory Visit | Attending: Internal Medicine | Admitting: Internal Medicine

## 2011-10-29 ENCOUNTER — Encounter (HOSPITAL_COMMUNITY): Payer: Self-pay

## 2011-10-29 ENCOUNTER — Encounter (HOSPITAL_COMMUNITY): Payer: Self-pay | Admitting: *Deleted

## 2011-10-29 VITALS — BP 132/66 | HR 72 | Ht 71.0 in | Wt 145.0 lb

## 2011-10-29 DIAGNOSIS — I5022 Chronic systolic (congestive) heart failure: Secondary | ICD-10-CM

## 2011-10-29 DIAGNOSIS — Z720 Tobacco use: Secondary | ICD-10-CM

## 2011-10-29 DIAGNOSIS — M109 Gout, unspecified: Secondary | ICD-10-CM

## 2011-10-29 DIAGNOSIS — F17200 Nicotine dependence, unspecified, uncomplicated: Secondary | ICD-10-CM

## 2011-10-29 NOTE — Assessment & Plan Note (Signed)
Well controlled. Continue allopurinol.

## 2011-10-29 NOTE — Progress Notes (Signed)
Patient ID: Patrick [name], male   DOB: [birth year], 56 y.o.   MRN: [id number]   Weight Range   Baseline proBNP     HPI:  Patrick [name] is a 56 y.o. African American male with past medical history significant for sickle cell disease with anemia, NICM, EF 99991111 and systolic heart failure diagnosed 05/2011, no CAD by cath 05/2011, CKD stage III, and tobacco abuse.   Admitted in 12/12 with new onset HF. Diuresed and improved. 05/2011: EF 30-35% range (gloabl HK). RV normal function. Mod LAE. RHC after diuresis.  RA mean 2  RV 24/3  PA 22/6  PCWP mean 8  LV 96/7  AO 100/64  Oxygen saturations:  PA 78%  AO 99%  Cardiac Output (Fick) 10.4  Cardiac Index (Fick) 5.7   He was to follow up in the Heart Failure Clinic at the beginning of the year but said no one told him when to come. He presented to the ER on 5/1 and found to be hypoxic with O2 ~87%. With recurrent HF and L wrist. Diuresed approximately 20 pounds. D/c weight 138 pounds. An echo was repeated on 10/17/11 showed LVEF 30-35%, diffuse hypokinesis, diastolic dysfunction, moderately dilated LA, mildly dilated RA, RV normal function and PA systolic pressure [id number] mmHg.   Here for f/u. Weighing at home every day. Weight steady around 143. Dyspnea is much improved. His lower extremity edema has resolved. No problems with getting medicines     ROS: All systems negative except as listed in HPI, PMH and Problem List.  Past Medical History  Diagnosis Date  . Shortness of breath   . Anemia     sickle cell anemia  . Headache   . Swelling of extremity, right   . Swelling abdomen   . Swelling of extremity, left   . Sickle cell anemia   . CHF (congestive heart failure)   . Blood dyscrasia     SICKLE CELL   . Blood transfusion     " NO REACTION TO TRANSFUSION"    Current Outpatient Prescriptions  Medication Sig Dispense Refill  . allopurinol (ZYLOPRIM) 100 MG tablet Take 1 tablet (100 mg total) by mouth daily.  30 tablet  0  .  aspirin EC 81 MG EC tablet Take 1 tablet (81 mg total) by mouth daily.  30 tablet  0  . carvedilol (COREG) 6.25 MG tablet Take 1 tablet (6.25 mg total) by mouth 2 (two) times daily with a meal.  60 tablet  0  . digoxin (LANOXIN) 0.125 MG tablet Take 0.125 mg by mouth daily.      . folic acid (FOLVITE) 1 MG tablet Take 2 tablets (2 mg total) by mouth daily.  60 tablet  0  . furosemide (LASIX) 40 MG tablet Take 40 mg by mouth daily.      Marland Kitchen lisinopril (PRINIVIL,ZESTRIL) 10 MG tablet Take 10 mg by mouth daily.      Marland Kitchen oxyCODONE-acetaminophen (PERCOCET) 5-325 MG per tablet Take 2 tablets by mouth every 6 (six) hours as needed.  20 tablet  0  . predniSONE (DELTASONE) 10 MG tablet Take 4 tablets daily for 1 day, then 3 tablets daily for 2 days then 2 tablets daily for 2 days then 1 tablet daily for 2 days.  16 tablet  0     PHYSICAL EXAM: Filed Vitals:   10/29/11 1525  BP: 132/66  Pulse: 72  Height: 5\' 11"  (1.803 m)  Weight: 145 lb (65.772 kg)  General:  Well appearing. No resp difficulty HEENT: normal Neck: supple. JVP 5-6. Carotids 2+ bilaterally; no bruits. No lymphadenopathy or thryomegaly appreciated. Cor: PMI normal. Regular rate & rhythm. No rubs, gallops or murmurs. Lungs: clear long expiratory phase Abdomen: soft, nontender, nondistended. No hepatosplenomegaly. No bruits or masses. Good bowel sounds. Extremities: no cyanosis, rash, edema + clubbing Neuro: alert & orientedx3, cranial nerves grossly intact. Moves all 4 extremities w/o difficulty. Affect pleasant.   ASSESSMENT & PLAN:

## 2011-10-29 NOTE — Patient Instructions (Signed)
Labs today  Your physician recommends that you schedule a follow-up appointment in: 3-4 weeks

## 2011-10-29 NOTE — Assessment & Plan Note (Signed)
Reminded of the need to quit smoking.

## 2011-10-29 NOTE — Assessment & Plan Note (Signed)
Doing very well. Volume status looks good. Reinforced need for daily weights and reviewed use of sliding scale diuretics. Will see back in 3-4 weeks for medication titration. Check labs today.

## 2011-11-02 ENCOUNTER — Encounter: Payer: Self-pay | Admitting: Internal Medicine

## 2011-11-02 ENCOUNTER — Ambulatory Visit (INDEPENDENT_AMBULATORY_CARE_PROVIDER_SITE_OTHER): Payer: PRIVATE HEALTH INSURANCE | Admitting: Internal Medicine

## 2011-11-02 VITALS — BP 126/78 | HR 76 | Ht 71.0 in | Wt 146.0 lb

## 2011-11-02 DIAGNOSIS — I10 Essential (primary) hypertension: Secondary | ICD-10-CM

## 2011-11-02 DIAGNOSIS — I509 Heart failure, unspecified: Secondary | ICD-10-CM

## 2011-11-02 DIAGNOSIS — I5043 Acute on chronic combined systolic (congestive) and diastolic (congestive) heart failure: Secondary | ICD-10-CM

## 2011-11-02 NOTE — Patient Instructions (Signed)
Patient to call if he decides to proceed with ICD

## 2011-11-02 NOTE — Assessment & Plan Note (Signed)
His blood pressure today is well controlled. He will continue his current medical therapy and maintain a low-sodium diet.

## 2011-11-02 NOTE — Progress Notes (Signed)
HPI [name] is referred today by Dr. Jeffie Pollock for evaluation and consideration for prophylactic ICD implantation. The patient is a very pleasant 56 year old man with a nonischemic cardiomyopathy and chronic class II congestive heart failure. His ejection fraction is 30-35% by transthoracic echo. He is on maximal medical therapy with carvedilol, digoxin, lisinopril, and furosemide. He denies syncope. He denies chest pain or peripheral edema. Since medical therapy was begun, his heart failure symptoms are improved. No Known Allergies   Current Outpatient Prescriptions  Medication Sig Dispense Refill  . allopurinol (ZYLOPRIM) 100 MG tablet Take 1 tablet (100 mg total) by mouth daily.  30 tablet  0  . aspirin EC 81 MG EC tablet Take 1 tablet (81 mg total) by mouth daily.  30 tablet  0  . carvedilol (COREG) 6.25 MG tablet Take 1 tablet (6.25 mg total) by mouth 2 (two) times daily with a meal.  60 tablet  0  . digoxin (LANOXIN) 0.125 MG tablet Take 0.125 mg by mouth daily.      . folic acid (FOLVITE) 1 MG tablet Take 2 tablets (2 mg total) by mouth daily.  60 tablet  0  . furosemide (LASIX) 40 MG tablet Take 40 mg by mouth daily.      Marland Kitchen lisinopril (PRINIVIL,ZESTRIL) 10 MG tablet Take 10 mg by mouth daily.      Marland Kitchen oxyCODONE-acetaminophen (PERCOCET) 5-325 MG per tablet Take 2 tablets by mouth every 6 (six) hours as needed.  20 tablet  0  . predniSONE (DELTASONE) 10 MG tablet Take 4 tablets daily for 1 day, then 3 tablets daily for 2 days then 2 tablets daily for 2 days then 1 tablet daily for 2 days.  16 tablet  0     Past Medical History  Diagnosis Date  . Shortness of breath   . Anemia     sickle cell anemia  . Headache   . Swelling of extremity, right   . Swelling abdomen   . Swelling of extremity, left   . Sickle cell anemia   . CHF (congestive heart failure)   . Blood dyscrasia     SICKLE CELL   . Blood transfusion     " NO REACTION TO TRANSFUSION"    ROS:   All systems  reviewed and negative except as noted in the HPI.   Past Surgical History  Procedure Date  . No past surgeries      Family History  Problem Relation Age of Onset  . Alcohol abuse Mother   . Liver disease Mother   . Cirrhosis Mother      History   Social History  . Marital Status: Divorced    Spouse Name: N/A    Number of Children: N/A  . Years of Education: N/A   Occupational History  . Not on file.   Social History Main Topics  . Smoking status: Current Everyday Smoker -- 0.5 packs/day for 25 years    Types: Cigarettes  . Smokeless tobacco: Never Used  . Alcohol Use: 4.2 oz/week    7 Cans of beer per week     rare  . Drug Use: No  . Sexually Active: Not Currently   Other Topics Concern  . Not on file   Social History Narrative  . No narrative on file     BP 126/78  Pulse 76  Ht 5\' 11"  (1.803 m)  Wt 146 lb (66.225 kg)  BMI 20.36 kg/m2  Physical Exam:  Well appearing middle-aged  man, NAD HEENT: Unremarkable Neck:  No JVD, no thyromegally Lungs:  Clear with no wheezes, rales, or rhonchi. HEART:  Regular rate rhythm, no murmurs, no rubs, no clicks no S3 or S4 gallop are appreciated. Abd:  soft, positive bowel sounds, no organomegally, no rebound, no guarding Ext:  2 plus pulses, no edema, no cyanosis, no clubbing Skin:  No rashes no nodules Neuro:  CN II through XII intact, motor grossly intact  Assess/Plan:

## 2011-11-02 NOTE — Assessment & Plan Note (Signed)
The patient's symptoms are well compensated. His ejection fraction is 30-35%. I've discussed the indications, risks, benefits, goals, and expectations of insertion of a prophylactic ICD. He is considering his options and will call us if he wishes to proceed with device insertion.

## 2011-11-13 ENCOUNTER — Encounter (HOSPITAL_COMMUNITY): Payer: Self-pay

## 2011-11-13 ENCOUNTER — Ambulatory Visit (HOSPITAL_COMMUNITY)
Admission: RE | Admit: 2011-11-13 | Discharge: 2011-11-13 | Disposition: A | Discharge status: Home / Self Care | Payer: PRIVATE HEALTH INSURANCE | Source: Ambulatory Visit | Attending: Internal Medicine | Admitting: Internal Medicine

## 2011-11-13 VITALS — BP 126/62 | HR 68 | Wt 145.4 lb

## 2011-11-13 DIAGNOSIS — I5022 Chronic systolic (congestive) heart failure: Secondary | ICD-10-CM | POA: Insufficient documentation

## 2011-11-13 MED ORDER — CARVEDILOL 6.25 MG PO TABS: 9.3750 mg | ORAL_TABLET | Freq: Two times a day (BID) | ORAL | Status: DC

## 2011-11-13 NOTE — Progress Notes (Signed)
Patient ID: Patrick [name], male   DOB: 02/27/1956, 56 y.o.   MRN: [id number]   Weight Range   Baseline proBNP     HPI:  Patrick [name] is a 56 y.o. African American male with past medical history significant for sickle cell disease with anemia, NICM, EF 99991111 and systolic heart failure diagnosed 05/2011, no CAD by cath 05/2011, CKD stage III, and tobacco abuse.   Admitted in 12/12 with new onset HF. Diuresed and improved. 05/2011: EF 30-35% range (gloabl HK). RV normal function. Mod LAE. RHC after diuresis.  RA mean 2  RV 24/3  PA 22/6  PCWP mean 8  LV 96/7  AO 100/64  Oxygen saturations:  PA 78%  AO 99%  Cardiac Output (Fick) 10.4  Cardiac Index (Fick) 5.7   Admitted to [facility] on 5/1 with recurrent HF. Diuresed approximately 20 pounds. D/c weight 138 pounds. An echo was repeated on 10/17/11 showed LVEF 30-35%, diffuse hypokinesis, diastolic dysfunction, moderately dilated LA, mildly dilated RA, RV normal function and PA systolic pressure Q000111Q mmHg.   Here for f/u. Weighing at home every day. Weight steady around 14-1453. Dyspnea is much improved. His lower extremity edema has resolved. No problems with getting medicines. No dizziness.  Gout resolved.   Recently lost his job due to medical absences and now looking for another job.     ROS: All systems negative except as listed in HPI, PMH and Problem List.  Past Medical History  Diagnosis Date  . Shortness of breath   . Anemia     sickle cell anemia  . Headache   . Swelling of extremity, right   . Swelling abdomen   . Swelling of extremity, left   . Sickle cell anemia   . CHF (congestive heart failure)   . Blood dyscrasia     SICKLE CELL   . Blood transfusion     " NO REACTION TO TRANSFUSION"    Current Outpatient Prescriptions  Medication Sig Dispense Refill  . allopurinol (ZYLOPRIM) 100 MG tablet Take 1 tablet (100 mg total) by mouth daily.  30 tablet  0  . aspirin EC 81 MG EC tablet Take 1 tablet (81 mg  total) by mouth daily.  30 tablet  0  . carvedilol (COREG) 6.25 MG tablet Take 1 tablet (6.25 mg total) by mouth 2 (two) times daily with a meal.  60 tablet  0  . digoxin (LANOXIN) 0.125 MG tablet Take 0.125 mg by mouth daily.      . folic acid (FOLVITE) 1 MG tablet Take 2 tablets (2 mg total) by mouth daily.  60 tablet  0  . furosemide (LASIX) 40 MG tablet Take 40 mg by mouth daily.      Marland Kitchen lisinopril (PRINIVIL,ZESTRIL) 10 MG tablet Take 10 mg by mouth daily.      . predniSONE (DELTASONE) 10 MG tablet Take 4 tablets daily for 1 day, then 3 tablets daily for 2 days then 2 tablets daily for 2 days then 1 tablet daily for 2 days.  16 tablet  0     PHYSICAL EXAM: Filed Vitals:   11/13/11 1058  BP: 126/62  Pulse: 68  Weight: 145 lb 6.4 oz (65.953 kg)  SpO2: 99%    General:  Well appearing. No resp difficulty HEENT: normal Neck: supple. JVP flat. Carotids 2+ bilaterally; no bruits. No lymphadenopathy or thryomegaly appreciated. Cor: PMI laterally displaced. Regular rate & rhythm. No rubs, gallops or murmurs. Lungs: clear long expiratory phase Abdomen:  soft, nontender, nondistended. No hepatosplenomegaly. No bruits or masses. Good bowel sounds. Extremities: no cyanosis, rash, edema + clubbing Neuro: alert & orientedx3, cranial nerves grossly intact. Moves all 4 extremities w/o difficulty. Affect pleasant.   ASSESSMENT & PLAN:

## 2011-11-13 NOTE — Assessment & Plan Note (Addendum)
Doing very well. Euvolemic. NYHA II. Will increase carvedilol to 9.375 bid for 2 weeks and then go to 12.5 bid after that. Continue daily weights. Call if problems. Return in 5 weeks. Check BMET today.

## 2011-11-13 NOTE — Patient Instructions (Signed)
Take Carvedilol 9.375 mg twice a day for 2 weeks then  Take Carvedilol 12.5 mg twice a day   Follow up in 5 weeks  Do the following things EVERYDAY: 1) Weigh yourself in the morning before breakfast. Write it down and keep it in a log. 2) Take your medicines as prescribed 3) Eat low salt foods--Limit salt (sodium) to 2000 mg per day.  4) Stay as active as you can everyday 5) Limit all fluids for the day to less than 2 liters

## 2011-11-15 ENCOUNTER — Encounter (HOSPITAL_COMMUNITY): Payer: Self-pay

## 2011-11-15 ENCOUNTER — Institutional Professional Consult (permissible substitution): Payer: PRIVATE HEALTH INSURANCE | Admitting: Internal Medicine

## 2011-11-26 ENCOUNTER — Ambulatory Visit (HOSPITAL_COMMUNITY)
Admission: RE | Admit: 2011-11-26 | Discharge: 2011-11-26 | Disposition: A | Discharge status: Home / Self Care | Payer: PRIVATE HEALTH INSURANCE | Source: Ambulatory Visit | Attending: Internal Medicine | Admitting: Internal Medicine

## 2011-11-26 ENCOUNTER — Encounter (HOSPITAL_COMMUNITY): Payer: Self-pay

## 2011-11-26 VITALS — BP 118/64 | HR 92 | Wt 148.8 lb

## 2011-11-26 DIAGNOSIS — D571 Sickle-cell disease without crisis: Secondary | ICD-10-CM | POA: Insufficient documentation

## 2011-11-26 DIAGNOSIS — I509 Heart failure, unspecified: Secondary | ICD-10-CM | POA: Insufficient documentation

## 2011-11-26 DIAGNOSIS — N183 Chronic kidney disease, stage 3 unspecified: Secondary | ICD-10-CM | POA: Insufficient documentation

## 2011-11-26 DIAGNOSIS — Z7982 Long term (current) use of aspirin: Secondary | ICD-10-CM | POA: Insufficient documentation

## 2011-11-26 DIAGNOSIS — Z720 Tobacco use: Secondary | ICD-10-CM

## 2011-11-26 DIAGNOSIS — F17200 Nicotine dependence, unspecified, uncomplicated: Secondary | ICD-10-CM | POA: Insufficient documentation

## 2011-11-26 DIAGNOSIS — I5022 Chronic systolic (congestive) heart failure: Secondary | ICD-10-CM | POA: Insufficient documentation

## 2011-11-26 MED ORDER — CARVEDILOL 6.25 MG PO TABS: 12.5000 mg | ORAL_TABLET | Freq: Two times a day (BID) | ORAL | Status: DC

## 2011-11-26 NOTE — Patient Instructions (Signed)
Please take an lasix if your weight increases to 148 pounds  Take carvedilol 12.5 mg twice a day. (2 tablets twice a day)  Do the following things EVERYDAY: 1) Weigh yourself in the morning before breakfast. Write it down and keep it in a log. 2) Take your medicines as prescribed 3) Eat low salt foods--Limit salt (sodium) to 2000 mg per day.  4) Stay as active as you can everyday 5) Limit all fluids for the day to less than 2 liters  Follow up in 2 weeks

## 2011-11-26 NOTE — Assessment & Plan Note (Signed)
NYHA II. Volume status mildly elevated. He has been eating high salt  Foods such as macroni. Instructed take an extra Lasix if his weight is 148 pounds or greater. Reviewed 11/13/11 BMET Potassium 5.2 therefore will not titrate lisinopril. Increase Carvedilol 12.5 mg twice a day. Reinforced daily weights and low salt food choices. Follow up in 2 weeks.

## 2011-11-26 NOTE — Assessment & Plan Note (Signed)
Discussed smoking cessation however he declines.

## 2011-11-26 NOTE — Progress Notes (Signed)
Patient ID: Patrick [name], male   DOB: Feb 23, 1956, 56 y.o.   MRN: [id number]   Weight Range   Baseline proBNP   PCP: Dr Criss Rosales  [facility]   HPI: Patrick [name] is a 55 y.o. African American male with past medical history significant for sickle cell disease with anemia, NICM, EF 99991111 and systolic heart failure diagnosed 05/2011, no CAD by cath 05/2011, CKD stage III, and tobacco abuse.   Admitted in 12/12 with new onset HF. Diuresed and improved. 05/2011: EF 30-35% range (gloabl HK). RV normal function. Mod LAE. RHC after diuresis.  RA mean 2  RV 24/3  PA 22/6  PCWP mean 8  LV 96/7  AO 100/64  Oxygen saturations:  PA 78%  AO 99%  Cardiac Output (Fick) 10.4  Cardiac Index (Fick) 5.7   Admitted to [facility] on 5/1 with recurrent HF. Diuresed approximately 20 pounds. D/c weight 138 pounds. An echo was repeated on 10/17/11 showed LVEF 30-35%, diffuse hypokinesis, diastolic dysfunction, moderately dilated LA, mildly dilated RA, RV normal function and PA systolic pressure Q000111Q mmHg.   11/13/11 Potassium 5.2 ProBNP 3363 Creatinine 1.20  He is for follow up. Last  Visit increase Carvedilol 9.375 mg twice a day. Repeat Pro BNP remained elevated. Denies SOB/PND/Orthopnea/CP.  Continues to smoke 1/2 pack per day.  Weight at home 143-148 pounds. He has been eating high salt foods such as macroni. Compliant with medications. Currently unemployed.      ROS: All systems negative except as listed in HPI, PMH and Problem List.  Past Medical History  Diagnosis Date  . Shortness of breath   . Anemia     sickle cell anemia  . Headache   . Swelling of extremity, right   . Swelling abdomen   . Swelling of extremity, left   . Sickle cell anemia   . CHF (congestive heart failure)   . Blood dyscrasia     SICKLE CELL   . Blood transfusion     " NO REACTION TO TRANSFUSION"    Current Outpatient Prescriptions  Medication Sig Dispense Refill  . allopurinol (ZYLOPRIM) 100 MG tablet Take 1  tablet (100 mg total) by mouth daily.  30 tablet  0  . aspirin EC 81 MG EC tablet Take 1 tablet (81 mg total) by mouth daily.  30 tablet  0  . carvedilol (COREG) 6.25 MG tablet Take 1.5 tablets (9.375 mg total) by mouth 2 (two) times daily with a meal.  90 tablet  0  . digoxin (LANOXIN) 0.125 MG tablet Take 0.125 mg by mouth daily.      . folic acid (FOLVITE) 1 MG tablet Take 2 tablets (2 mg total) by mouth daily.  60 tablet  0  . furosemide (LASIX) 40 MG tablet Take 40 mg by mouth daily.      Marland Kitchen lisinopril (PRINIVIL,ZESTRIL) 10 MG tablet Take 10 mg by mouth daily.      . predniSONE (DELTASONE) 10 MG tablet Take 4 tablets daily for 1 day, then 3 tablets daily for 2 days then 2 tablets daily for 2 days then 1 tablet daily for 2 days.  16 tablet  0     PHYSICAL EXAM: Filed Vitals:   11/26/11 0923  BP: 118/64  Pulse: 92  Weight: 148 lb 12.8 oz (67.495 kg)  SpO2: 95%    General:  Well appearing. No resp difficulty HEENT: normal Neck: supple. JVP 7-8. Carotids 2+ bilaterally; no bruits. No lymphadenopathy or thryomegaly appreciated. Cor:  PMI laterally displaced. Regular rate & rhythm. No rubs, gallops or murmurs. Lungs: clear long expiratory phase Abdomen: soft, nontender, nondistended. No hepatosplenomegaly. No bruits or masses. Good bowel sounds. Extremities: no cyanosis, rash, edema + clubbing Neuro: alert & orientedx3, cranial nerves grossly intact. Moves all 4 extremities w/o difficulty. Affect pleasant.   ASSESSMENT & PLAN:

## 2011-12-04 ENCOUNTER — Encounter (HOSPITAL_COMMUNITY): Payer: Self-pay

## 2011-12-11 ENCOUNTER — Ambulatory Visit (HOSPITAL_COMMUNITY)
Admission: RE | Admit: 2011-12-11 | Discharge: 2011-12-11 | Disposition: A | Discharge status: Home / Self Care | Payer: PRIVATE HEALTH INSURANCE | Source: Ambulatory Visit | Attending: Internal Medicine | Admitting: Internal Medicine

## 2011-12-11 ENCOUNTER — Encounter (HOSPITAL_COMMUNITY): Payer: Self-pay

## 2011-12-11 VITALS — BP 126/66 | HR 75 | Wt 149.5 lb

## 2011-12-11 DIAGNOSIS — Z720 Tobacco use: Secondary | ICD-10-CM

## 2011-12-11 DIAGNOSIS — I5022 Chronic systolic (congestive) heart failure: Secondary | ICD-10-CM | POA: Insufficient documentation

## 2011-12-11 DIAGNOSIS — F17200 Nicotine dependence, unspecified, uncomplicated: Secondary | ICD-10-CM

## 2011-12-11 MED ORDER — FUROSEMIDE 40 MG PO TABS: 40.0000 mg | ORAL_TABLET | Freq: Every day | ORAL | Status: DC

## 2011-12-11 MED ORDER — CARVEDILOL 12.5 MG PO TABS: ORAL_TABLET | ORAL | Status: DC

## 2011-12-11 NOTE — Progress Notes (Signed)
Patient ID: Patrick [name], male   DOB: Dec 29, 1955, 56 y.o.   MRN: [id number]  Weight Range   Baseline proBNP   PCP: Dr Criss Rosales  [facility] [employer] [facility]   HPI: Patrick [name] is a 56 y.o. African American male with past medical history significant for sickle cell disease with anemia, NICM, EF 99991111 and systolic heart failure diagnosed 05/2011, no CAD by cath 05/2011, CKD stage III, and tobacco abuse.   Admitted in 12/12 with new onset HF. Diuresed and improved. 05/2011: EF 30-35% range (gloabl HK). RV normal function. Mod LAE. RHC after diuresis.  RA mean 2  RV 24/3  PA 22/6  PCWP mean 8  LV 96/7  AO 100/64  Oxygen saturations:  PA 78%  AO 99%  Cardiac Output (Fick) 10.4  Cardiac Index (Fick) 5.7   Admitted to [facility] on 5/1 with recurrent HF. Diuresed approximately 20 pounds. D/c weight 138 pounds. An echo was repeated on 10/17/11 showed LVEF 30-35%, diffuse hypokinesis, diastolic dysfunction, moderately dilated LA, mildly dilated RA, RV normal function and PA systolic pressure Q000111Q mmHg.   11/13/11 Potassium 5.2 ProBNP 3363 Creatinine 1.20 11/28/11 Potassium 4.2  Creatinine 1.3  He is here for follow up. Last visit increased Carvedilol 12.5 twice a day and he has tolerated up titration. He has been taking 60 mg of lasix daily for the last 2 weeks.  Denies SOB/PND/Orthopnea/CP. Denies dizziness.   Continues to smoke 4 cigarettes per day.  Weight at home 139-144 pounds.  Following low salt diet.  Compliant with medications.  Currently unemployed. Walking about 1 mile every other day.    ROS: All systems negative except as listed in HPI, PMH and Problem List.  Past Medical History  Diagnosis Date  . Shortness of breath   . Anemia     sickle cell anemia  . Headache   . Swelling of extremity, right   . Swelling abdomen   . Swelling of extremity, left   . Sickle cell anemia   . CHF (congestive heart failure)   . Blood dyscrasia     SICKLE CELL   . Blood transfusion     " NO REACTION TO  TRANSFUSION"    Current Outpatient Prescriptions  Medication Sig Dispense Refill  . allopurinol (ZYLOPRIM) 100 MG tablet Take 1 tablet (100 mg total) by mouth daily.  30 tablet  0  . aspirin EC 81 MG EC tablet Take 1 tablet (81 mg total) by mouth daily.  30 tablet  0  . carvedilol (COREG) 6.25 MG tablet Take 2 tablets (12.5 mg total) by mouth 2 (two) times daily with a meal.    0  . digoxin (LANOXIN) 0.125 MG tablet Take 0.125 mg by mouth daily.      . folic acid (FOLVITE) 1 MG tablet Take 2 tablets (2 mg total) by mouth daily.  60 tablet  0  . furosemide (LASIX) 40 MG tablet Take 40 mg by mouth daily.      Marland Kitchen lisinopril (PRINIVIL,ZESTRIL) 10 MG tablet Take 10 mg by mouth daily.         PHYSICAL EXAM: Filed Vitals:   12/11/11 1050  BP: 126/66  Pulse: 75  Weight: 149 lb 8 oz (67.813 kg)  SpO2: 94%    General:  Well appearing. No resp difficulty HEENT: normal Neck: supple. JVP 8-9. Carotids 2+ bilaterally; no bruits. No lymphadenopathy or thryomegaly appreciated. Cor: PMI laterally displaced. Regular rate & rhythm. No rubs, gallops or murmurs. Lungs: clear long expiratory  phase Abdomen: soft, nontender, nondistended. No hepatosplenomegaly. No bruits or masses. Good bowel sounds. Extremities: no cyanosis, rash, edema + clubbing Neuro: alert & orientedx3, cranial nerves grossly intact. Moves all 4 extremities w/o difficulty. Affect pleasant.   ASSESSMENT & PLAN:

## 2011-12-11 NOTE — Assessment & Plan Note (Addendum)
NYHA II. Volume status mildly elevated. Double lasix for the next 2 days then back to Lasix 40 mg daily. Increase Carvedilol at night to 18.75 mg and continue 12.5 mg in am. Reinforced daily weights, low salt diet choices, and medication compliance. Repeat BMET and BNP today. If potassium is <5 anticipate increasing ace inhibitor at next office visit. Follow up in 2 weeks.

## 2011-12-11 NOTE — Patient Instructions (Addendum)
Take Lasix 80 mg daily today and tomorrow. On Thursday go back to Lasix 40 mg daily  Take Carvedilol 6.25 mg tabs, 2 tabs in AM and 3 tabs in PM  Follow up in 2 weeks  Do the following things EVERYDAY: 1) Weigh yourself in the morning before breakfast. Write it down and keep it in a log. 2) Take your medicines as prescribed 3) Eat low salt foods--Limit salt (sodium) to 2000 mg per day.  4) Stay as active as you can everyday 5) Limit all fluids for the day to less than 2 liters

## 2011-12-11 NOTE — Assessment & Plan Note (Signed)
Discussed smoking cessation however he declines.

## 2011-12-13 ENCOUNTER — Encounter (HOSPITAL_COMMUNITY): Payer: Self-pay

## 2011-12-27 ENCOUNTER — Ambulatory Visit (HOSPITAL_COMMUNITY)
Admission: RE | Admit: 2011-12-27 | Discharge: 2011-12-27 | Disposition: A | Discharge status: Home / Self Care | Payer: PRIVATE HEALTH INSURANCE | Source: Ambulatory Visit | Attending: Internal Medicine | Admitting: Internal Medicine

## 2011-12-27 ENCOUNTER — Encounter (HOSPITAL_COMMUNITY): Payer: Self-pay

## 2011-12-27 VITALS — BP 118/64 | HR 81 | Wt 148.0 lb

## 2011-12-27 DIAGNOSIS — Z720 Tobacco use: Secondary | ICD-10-CM

## 2011-12-27 DIAGNOSIS — M7989 Other specified soft tissue disorders: Secondary | ICD-10-CM | POA: Insufficient documentation

## 2011-12-27 DIAGNOSIS — R0602 Shortness of breath: Secondary | ICD-10-CM | POA: Insufficient documentation

## 2011-12-27 DIAGNOSIS — D571 Sickle-cell disease without crisis: Secondary | ICD-10-CM | POA: Insufficient documentation

## 2011-12-27 DIAGNOSIS — I5022 Chronic systolic (congestive) heart failure: Secondary | ICD-10-CM

## 2011-12-27 DIAGNOSIS — F17200 Nicotine dependence, unspecified, uncomplicated: Secondary | ICD-10-CM

## 2011-12-27 DIAGNOSIS — I509 Heart failure, unspecified: Secondary | ICD-10-CM | POA: Insufficient documentation

## 2011-12-27 DIAGNOSIS — R51 Headache: Secondary | ICD-10-CM | POA: Insufficient documentation

## 2011-12-27 DIAGNOSIS — N183 Chronic kidney disease, stage 3 unspecified: Secondary | ICD-10-CM | POA: Insufficient documentation

## 2011-12-27 DIAGNOSIS — Z7982 Long term (current) use of aspirin: Secondary | ICD-10-CM | POA: Insufficient documentation

## 2011-12-27 MED ORDER — CARVEDILOL 12.5 MG PO TABS: ORAL_TABLET | ORAL | Status: DC

## 2011-12-27 NOTE — Assessment & Plan Note (Signed)
He declines smoking cessation assistance.

## 2011-12-27 NOTE — Progress Notes (Signed)
Patient ID: Patrick [name], male   DOB: 1956-02-12, 56 y.o.   MRN: [id number]  Weight Range   Baseline proBNP   PCP: Dr Criss Rosales  [facility]   HPI: Patrick [name] is a 56 y.o. African American male with past medical history significant for sickle cell disease with anemia, NICM, EF 99991111 and systolic heart failure diagnosed 05/2011, no CAD by cath 05/2011, CKD stage III, and tobacco abuse.   Admitted in 12/12 with new onset HF. Diuresed and improved. 05/2011: EF 30-35% range (gloabl HK). RV normal function. Mod LAE. RHC after diuresis.  RA mean 2  RV 24/3  PA 22/6  PCWP mean 8  LV 96/7  AO 100/64  Oxygen saturations:  PA 78%  AO 99%  Cardiac Output (Fick) 10.4  Cardiac Index (Fick) 5.7   Admitted to [facility] on 5/1 with recurrent HF. Diuresed approximately 20 pounds. D/c weight 138 pounds. An echo was repeated on 10/17/11 showed LVEF 30-35%, diffuse hypokinesis, diastolic dysfunction, moderately dilated LA, mildly dilated RA, RV normal function and PA systolic pressure Q000111Q mmHg.   11/13/11 Potassium 5.2 ProBNP 3363 Creatinine 1.20 11/28/11 Potassium 4.2  Creatinine 1.3  He is here for follow up. Last visit Carvedilol increased at night to 18.75 mg and continue 12.5 mg in am. Denies SOB/PND/Orthopnea/CP. Denies dizziness. Weight at home 144-145. Smokes 4 cigarettes per day. Complaint with medications. Tries to follow low salt diet.    ROS: All systems negative except as listed in HPI, PMH and Problem List.  Past Medical History  Diagnosis Date  . Shortness of breath   . Anemia     sickle cell anemia  . Headache   . Swelling of extremity, right   . Swelling abdomen   . Swelling of extremity, left   . Sickle cell anemia   . CHF (congestive heart failure)   . Blood dyscrasia     SICKLE CELL   . Blood transfusion     " NO REACTION TO TRANSFUSION"    Current Outpatient Prescriptions  Medication Sig Dispense Refill  . allopurinol (ZYLOPRIM) 100 MG tablet Take 1 tablet (100  mg total) by mouth daily.  30 tablet  0  . aspirin EC 81 MG EC tablet Take 1 tablet (81 mg total) by mouth daily.  30 tablet  0  . carvedilol (COREG) 12.5 MG tablet Take 1 tab in am and 1 1/2 tabs in pm  90 tablet  0  . digoxin (LANOXIN) 0.125 MG tablet Take 0.125 mg by mouth daily.      . folic acid (FOLVITE) 1 MG tablet Take 2 tablets (2 mg total) by mouth daily.  60 tablet  0  . furosemide (LASIX) 40 MG tablet Take 1 tablet (40 mg total) by mouth daily.  30 tablet  3  . lisinopril (PRINIVIL,ZESTRIL) 10 MG tablet Take 10 mg by mouth daily.         PHYSICAL EXAM: Filed Vitals:   12/27/11 0950  BP: 118/64  Pulse: 81  Weight: 148 lb (67.132 kg)  SpO2: 95%    General:  Well appearing. No resp difficulty HEENT: normal Neck: supple. JVP 5-6. Carotids 2+ bilaterally; no bruits. No lymphadenopathy or thryomegaly appreciated. Cor: PMI laterally displaced. Regular rate & rhythm. No rubs, gallops or murmurs. Lungs: clear long expiratory phase Abdomen: soft, nontender, nondistended. No hepatosplenomegaly. No bruits or masses. Good bowel sounds. Extremities: no cyanosis, rash, edema + clubbing Neuro: alert & orientedx3, cranial nerves grossly intact. Moves all  4 extremities w/o difficulty. Affect pleasant.   ASSESSMENT & PLAN:

## 2011-12-27 NOTE — Assessment & Plan Note (Signed)
Volume status stable. Increase Carvedilol 18.75 mg twice a day. Obtain BMET and BNP today. Next visit anticipate increasing ace inhibitor if renal stable. Reinforced daily weights, low salt diet, and fluid restrictions. Follow up in 3 weeks with an ECHO.

## 2011-12-27 NOTE — Patient Instructions (Addendum)
Follow up in 3 weeks with an ECHO  Take Carvedilol 18.75 mg twice a day. (1 1/2 tab am and pm)  Do the following things EVERYDAY: 1) Weigh yourself in the morning before breakfast. Write it down and keep it in a log. 2) Take your medicines as prescribed 3) Eat low salt foods-Limit salt (sodium) to 2000 mg per day.  4) Stay as active as you can everyday 5) Limit all fluids for the day to less than 2 liters

## 2011-12-28 ENCOUNTER — Telehealth (HOSPITAL_COMMUNITY): Payer: Self-pay | Admitting: Adult Health

## 2011-12-28 DIAGNOSIS — I5022 Chronic systolic (congestive) heart failure: Secondary | ICD-10-CM

## 2011-12-28 MED ORDER — LISINOPRIL 10 MG PO TABS: 5.0000 mg | ORAL_TABLET | Freq: Every day | ORAL | Status: DC

## 2011-12-28 NOTE — Telephone Encounter (Signed)
Have attempted to call pt several times, will continue to try

## 2011-12-28 NOTE — Telephone Encounter (Signed)
Attempted to contact however his mailbox is full. Will decrease Lisinopril 5 mg daily. Repeat BMET next week.

## 2012-01-04 ENCOUNTER — Telehealth: Payer: Self-pay

## 2012-01-04 NOTE — Telephone Encounter (Signed)
Still unable to reach pt, left mess to call back

## 2012-01-07 NOTE — Addendum Note (Signed)
Addended by: Scarlette Calico on: 01/07/2012 12:07 PM   Modules accepted: Orders

## 2012-01-07 NOTE — Telephone Encounter (Signed)
Gay Filler in [place] was finally able to speak with pt on Friday, he is aware to decrease Lisinopril 5 mg daily and will have repeat bmet 7/23

## 2012-01-08 ENCOUNTER — Other Ambulatory Visit (INDEPENDENT_AMBULATORY_CARE_PROVIDER_SITE_OTHER): Payer: PRIVATE HEALTH INSURANCE

## 2012-01-08 DIAGNOSIS — I5022 Chronic systolic (congestive) heart failure: Secondary | ICD-10-CM

## 2012-01-08 LAB — BASIC METABOLIC PANEL
BUN: 67 mg/dL — ABNORMAL HIGH (ref 6–23)
CO2: 16 mEq/L — ABNORMAL LOW (ref 19–32)
Calcium: 8.8 mg/dL (ref 8.4–10.5)
Chloride: 110 mEq/L (ref 96–112)
Creatinine, Ser: 1.9 mg/dL — ABNORMAL HIGH (ref 0.4–1.5)
GFR: 46.63 mL/min — ABNORMAL LOW (ref >60.00)
Glucose, Bld: 94 mg/dL (ref 70–99)
Potassium: 5 mEq/L (ref 3.5–5.1)
Sodium: 133 mEq/L — ABNORMAL LOW (ref 135–145)

## 2012-01-15 ENCOUNTER — Encounter: Payer: Self-pay | Admitting: Internal Medicine

## 2012-01-17 ENCOUNTER — Other Ambulatory Visit (HOSPITAL_COMMUNITY): Payer: Self-pay | Admitting: *Deleted

## 2012-01-17 ENCOUNTER — Encounter (HOSPITAL_COMMUNITY): Payer: Self-pay | Admitting: Emergency Medicine

## 2012-01-17 ENCOUNTER — Encounter (HOSPITAL_COMMUNITY): Payer: Self-pay

## 2012-01-17 ENCOUNTER — Ambulatory Visit (HOSPITAL_BASED_OUTPATIENT_CLINIC_OR_DEPARTMENT_OTHER)
Admission: RE | Admit: 2012-01-17 | Discharge: 2012-01-17 | Disposition: A | Discharge status: Home / Self Care | Payer: Medicaid Other | Source: Ambulatory Visit | Attending: Internal Medicine | Admitting: Internal Medicine

## 2012-01-17 ENCOUNTER — Ambulatory Visit (HOSPITAL_COMMUNITY)
Admission: RE | Admit: 2012-01-17 | Discharge: 2012-01-17 | Disposition: A | Discharge status: Home / Self Care | Payer: Medicaid Other | Source: Ambulatory Visit | Attending: Internal Medicine | Admitting: Internal Medicine

## 2012-01-17 ENCOUNTER — Telehealth (HOSPITAL_COMMUNITY): Payer: Self-pay | Admitting: Physician Assistant

## 2012-01-17 ENCOUNTER — Inpatient Hospital Stay (HOSPITAL_COMMUNITY)
Admission: EM | Admit: 2012-01-17 | Discharge: 2012-01-22 | DRG: 811 | Disposition: A | Discharge status: Home / Self Care | Payer: Medicaid Other | Attending: Internal Medicine | Admitting: Internal Medicine

## 2012-01-17 VITALS — BP 118/58 | HR 64 | Wt 152.4 lb

## 2012-01-17 DIAGNOSIS — R109 Unspecified abdominal pain: Secondary | ICD-10-CM | POA: Insufficient documentation

## 2012-01-17 DIAGNOSIS — I5032 Chronic diastolic (congestive) heart failure: Secondary | ICD-10-CM

## 2012-01-17 DIAGNOSIS — N390 Urinary tract infection, site not specified: Secondary | ICD-10-CM | POA: Diagnosis present

## 2012-01-17 DIAGNOSIS — I5022 Chronic systolic (congestive) heart failure: Secondary | ICD-10-CM

## 2012-01-17 DIAGNOSIS — Z7982 Long term (current) use of aspirin: Secondary | ICD-10-CM

## 2012-01-17 DIAGNOSIS — D649 Anemia, unspecified: Secondary | ICD-10-CM | POA: Diagnosis present

## 2012-01-17 DIAGNOSIS — D638 Anemia in other chronic diseases classified elsewhere: Secondary | ICD-10-CM | POA: Diagnosis present

## 2012-01-17 DIAGNOSIS — I428 Other cardiomyopathies: Secondary | ICD-10-CM | POA: Diagnosis present

## 2012-01-17 DIAGNOSIS — N184 Chronic kidney disease, stage 4 (severe): Secondary | ICD-10-CM | POA: Diagnosis present

## 2012-01-17 DIAGNOSIS — E875 Hyperkalemia: Secondary | ICD-10-CM | POA: Diagnosis present

## 2012-01-17 DIAGNOSIS — D72829 Elevated white blood cell count, unspecified: Secondary | ICD-10-CM | POA: Diagnosis present

## 2012-01-17 DIAGNOSIS — I5043 Acute on chronic combined systolic (congestive) and diastolic (congestive) heart failure: Secondary | ICD-10-CM | POA: Diagnosis present

## 2012-01-17 DIAGNOSIS — I509 Heart failure, unspecified: Secondary | ICD-10-CM

## 2012-01-17 DIAGNOSIS — Z79899 Other long term (current) drug therapy: Secondary | ICD-10-CM

## 2012-01-17 DIAGNOSIS — I059 Rheumatic mitral valve disease, unspecified: Secondary | ICD-10-CM

## 2012-01-17 DIAGNOSIS — N179 Acute kidney failure, unspecified: Secondary | ICD-10-CM | POA: Diagnosis present

## 2012-01-17 DIAGNOSIS — F17200 Nicotine dependence, unspecified, uncomplicated: Secondary | ICD-10-CM | POA: Diagnosis present

## 2012-01-17 DIAGNOSIS — D5700 Hb-SS disease with crisis, unspecified: Principal | ICD-10-CM | POA: Diagnosis present

## 2012-01-17 DIAGNOSIS — N183 Chronic kidney disease, stage 3 unspecified: Secondary | ICD-10-CM

## 2012-01-17 DIAGNOSIS — Z720 Tobacco use: Secondary | ICD-10-CM

## 2012-01-17 DIAGNOSIS — M109 Gout, unspecified: Secondary | ICD-10-CM | POA: Diagnosis present

## 2012-01-17 LAB — PROTIME-INR
INR: 1.27 (ref 0.00–1.49)
Prothrombin Time: 16.2 s — ABNORMAL HIGH (ref 11.6–15.2)

## 2012-01-17 LAB — BASIC METABOLIC PANEL
BUN: 55 mg/dL — ABNORMAL HIGH (ref 6–23)
CO2: 18 mEq/L — ABNORMAL LOW (ref 19–32)
Calcium: 8.7 mg/dL (ref 8.4–10.5)
Chloride: 104 mEq/L (ref 96–112)
Creatinine, Ser: 2.1 mg/dL — ABNORMAL HIGH (ref 0.50–1.35)
GFR calc Af Amer: 40 mL/min — ABNORMAL LOW (ref >90)
GFR calc non Af Amer: 34 mL/min — ABNORMAL LOW (ref >90)
Glucose, Bld: 99 mg/dL (ref 70–99)
Potassium: 4.4 mEq/L (ref 3.5–5.1)
Sodium: 137 mEq/L (ref 135–145)

## 2012-01-17 LAB — COMPREHENSIVE METABOLIC PANEL WITH GFR
ALT: 28 U/L (ref 0–53)
AST: 77 U/L — ABNORMAL HIGH (ref 0–37)
Albumin: 3.6 g/dL (ref 3.5–5.2)
Alkaline Phosphatase: 80 U/L (ref 39–117)
BUN: 53 mg/dL — ABNORMAL HIGH (ref 6–23)
CO2: 19 meq/L (ref 19–32)
Calcium: 8.9 mg/dL (ref 8.4–10.5)
Chloride: 106 meq/L (ref 96–112)
Creatinine, Ser: 2.12 mg/dL — ABNORMAL HIGH (ref 0.50–1.35)
GFR calc Af Amer: 39 mL/min — ABNORMAL LOW
GFR calc non Af Amer: 34 mL/min — ABNORMAL LOW
Glucose, Bld: 107 mg/dL — ABNORMAL HIGH (ref 70–99)
Potassium: 4.3 meq/L (ref 3.5–5.1)
Sodium: 137 meq/L (ref 135–145)
Total Bilirubin: 5 mg/dL — ABNORMAL HIGH (ref 0.3–1.2)
Total Protein: 6.9 g/dL (ref 6.0–8.3)

## 2012-01-17 LAB — CBC
HCT: 15.9 % — ABNORMAL LOW (ref 39.0–52.0)
Hemoglobin: 6 g/dL — CL (ref 13.0–17.0)
MCH: 32.3 pg (ref 26.0–34.0)
MCHC: 37.7 g/dL — ABNORMAL HIGH (ref 30.0–36.0)
MCV: 85.5 fL (ref 78.0–100.0)
Platelets: 136 10*3/uL — ABNORMAL LOW (ref 150–400)
RBC: 1.86 MIL/uL — ABNORMAL LOW (ref 4.22–5.81)
RDW: 24.5 % — ABNORMAL HIGH (ref 11.5–15.5)
WBC: 13 10*3/uL — ABNORMAL HIGH (ref 4.0–10.5)

## 2012-01-17 LAB — URINE MICROSCOPIC-ADD ON

## 2012-01-17 LAB — URINALYSIS, ROUTINE W REFLEX MICROSCOPIC
Bilirubin Urine: NEGATIVE
Glucose, UA: NEGATIVE mg/dL
Ketones, ur: NEGATIVE mg/dL
Nitrite: NEGATIVE
Protein, ur: 100 mg/dL — AB
Specific Gravity, Urine: 1.013 (ref 1.005–1.030)
Urobilinogen, UA: 1 mg/dL (ref 0.0–1.0)
pH: 5.5 (ref 5.0–8.0)

## 2012-01-17 LAB — CBC WITH DIFFERENTIAL/PLATELET
Basophils Absolute: 0.1 10*3/uL (ref 0.0–0.1)
Basophils Relative: 1 % (ref 0–1)
Eosinophils Absolute: 0.8 10*3/uL — ABNORMAL HIGH (ref 0.0–0.7)
Eosinophils Relative: 7 % — ABNORMAL HIGH (ref 0–5)
HCT: 16.4 % — ABNORMAL LOW (ref 39.0–52.0)
Hemoglobin: 6.3 g/dL — CL (ref 13.0–17.0)
Lymphocytes Relative: 22 % (ref 12–46)
Lymphs Abs: 2.4 10*3/uL (ref 0.7–4.0)
MCH: 33.2 pg (ref 26.0–34.0)
MCHC: 38.4 g/dL — ABNORMAL HIGH (ref 30.0–36.0)
MCV: 86.3 fL (ref 78.0–100.0)
Monocytes Absolute: 1.6 10*3/uL — ABNORMAL HIGH (ref 0.1–1.0)
Monocytes Relative: 15 % — ABNORMAL HIGH (ref 3–12)
Neutro Abs: 6 10*3/uL (ref 1.7–7.7)
Neutrophils Relative %: 55 % (ref 43–77)
Platelets: 170 10*3/uL (ref 150–400)
RBC: 1.9 MIL/uL — ABNORMAL LOW (ref 4.22–5.81)
RDW: 25.3 % — ABNORMAL HIGH (ref 11.5–15.5)
WBC: 10.9 10*3/uL — ABNORMAL HIGH (ref 4.0–10.5)
nRBC: 6 /100 WBC — ABNORMAL HIGH

## 2012-01-17 LAB — OCCULT BLOOD, POC DEVICE: Fecal Occult Bld: NEGATIVE

## 2012-01-17 LAB — RETICULOCYTES
RBC.: 1.9 MIL/uL — ABNORMAL LOW (ref 4.22–5.81)
Retic Count, Absolute: 638.4 10*3/uL — ABNORMAL HIGH (ref 19.0–186.0)
Retic Ct Pct: 33.6 % — ABNORMAL HIGH (ref 0.4–3.1)

## 2012-01-17 LAB — PRO B NATRIURETIC PEPTIDE: Pro B Natriuretic peptide (BNP): 1782 pg/mL — ABNORMAL HIGH (ref 0–125)

## 2012-01-17 LAB — APTT: aPTT: 34 seconds (ref 24–37)

## 2012-01-17 LAB — BILIRUBIN, DIRECT: Bilirubin, Direct: 0.5 mg/dL — ABNORMAL HIGH (ref 0.0–0.3)

## 2012-01-17 LAB — LACTATE DEHYDROGENASE: LDH: 1359 U/L — ABNORMAL HIGH (ref 94–250)

## 2012-01-17 LAB — PREPARE RBC (CROSSMATCH)

## 2012-01-17 MED ORDER — HYDROCODONE-ACETAMINOPHEN 5-325 MG PO TABS
1.0000 | ORAL_TABLET | ORAL | Status: DC | PRN
  Administered 2012-01-18: 1 via ORAL
  Administered 2012-01-18: 2 via ORAL
  Administered 2012-01-18: 1 via ORAL
  Administered 2012-01-19 – 2012-01-22 (×4): 2 via ORAL
  Filled 2012-01-17 (×4): qty 2
  Filled 2012-01-17: qty 1
  Filled 2012-01-17 (×2): qty 2

## 2012-01-17 MED ORDER — CEFTRIAXONE SODIUM 1 G IJ SOLR
1.0000 g | INTRAMUSCULAR | Status: DC
  Administered 2012-01-18 – 2012-01-21 (×5): 1 g via INTRAVENOUS
  Filled 2012-01-17 (×6): qty 10

## 2012-01-17 MED ORDER — HYDROMORPHONE HCL PF 1 MG/ML IJ SOLN
1.0000 mg | INTRAMUSCULAR | Status: DC | PRN
  Administered 2012-01-18 – 2012-01-20 (×6): 1 mg via INTRAVENOUS
  Filled 2012-01-17 (×6): qty 1

## 2012-01-17 MED ORDER — ASPIRIN EC 81 MG PO TBEC
81.0000 mg | DELAYED_RELEASE_TABLET | Freq: Every day | ORAL | Status: DC
  Administered 2012-01-18 – 2012-01-22 (×5): 81 mg via ORAL
  Filled 2012-01-17 (×5): qty 1

## 2012-01-17 MED ORDER — FOLIC ACID 1 MG PO TABS
2.0000 mg | ORAL_TABLET | Freq: Every day | ORAL | Status: DC
  Administered 2012-01-18 – 2012-01-22 (×5): 2 mg via ORAL
  Filled 2012-01-17 (×5): qty 2

## 2012-01-17 MED ORDER — ALLOPURINOL 100 MG PO TABS
100.0000 mg | ORAL_TABLET | Freq: Every day | ORAL | Status: DC
  Administered 2012-01-18 – 2012-01-22 (×5): 100 mg via ORAL
  Filled 2012-01-17 (×5): qty 1

## 2012-01-17 MED ORDER — CARVEDILOL 6.25 MG PO TABS
18.7500 mg | ORAL_TABLET | Freq: Two times a day (BID) | ORAL | Status: DC
  Administered 2012-01-18 – 2012-01-22 (×9): 18.75 mg via ORAL
  Filled 2012-01-17 (×11): qty 1

## 2012-01-17 MED ORDER — LISINOPRIL 5 MG PO TABS
5.0000 mg | ORAL_TABLET | Freq: Every day | ORAL | Status: DC
  Administered 2012-01-18 – 2012-01-20 (×3): 5 mg via ORAL
  Filled 2012-01-17 (×4): qty 1

## 2012-01-17 MED ORDER — PANTOPRAZOLE SODIUM 40 MG PO TBEC
40.0000 mg | DELAYED_RELEASE_TABLET | Freq: Once | ORAL | Status: AC
Start: 2012-01-17 — End: 2012-01-17
  Administered 2012-01-17: 40 mg via ORAL
  Filled 2012-01-17: qty 1

## 2012-01-17 MED ORDER — ONDANSETRON HCL 4 MG PO TABS: 4.0000 mg | ORAL_TABLET | Freq: Four times a day (QID) | ORAL | Status: DC | PRN

## 2012-01-17 MED ORDER — FUROSEMIDE 10 MG/ML IJ SOLN
20.0000 mg | Freq: Once | INTRAMUSCULAR | Status: AC
  Administered 2012-01-18: 20 mg via INTRAVENOUS
  Filled 2012-01-17: qty 2

## 2012-01-17 MED ORDER — MORPHINE SULFATE 4 MG/ML IJ SOLN
4.0000 mg | Freq: Once | INTRAMUSCULAR | Status: AC
  Administered 2012-01-17: 4 mg via INTRAVENOUS
  Filled 2012-01-17: qty 1

## 2012-01-17 MED ORDER — PANTOPRAZOLE SODIUM 40 MG PO TBEC
40.0000 mg | DELAYED_RELEASE_TABLET | Freq: Every day | ORAL | Status: DC
  Administered 2012-01-18 – 2012-01-22 (×5): 40 mg via ORAL
  Filled 2012-01-17 (×5): qty 1

## 2012-01-17 MED ORDER — ALUM & MAG HYDROXIDE-SIMETH 200-200-20 MG/5ML PO SUSP: 30.0000 mL | Freq: Four times a day (QID) | ORAL | Status: DC | PRN

## 2012-01-17 MED ORDER — SODIUM CHLORIDE 0.9 % IJ SOLN
3.0000 mL | Freq: Two times a day (BID) | INTRAMUSCULAR | Status: DC
  Administered 2012-01-18 – 2012-01-22 (×9): 3 mL via INTRAVENOUS

## 2012-01-17 MED ORDER — FUROSEMIDE 40 MG PO TABS: 40.0000 mg | ORAL_TABLET | ORAL | Status: DC | PRN

## 2012-01-17 MED ORDER — ONDANSETRON HCL 4 MG/2ML IJ SOLN: 4.0000 mg | Freq: Four times a day (QID) | INTRAMUSCULAR | Status: DC | PRN

## 2012-01-17 MED ORDER — ACETAMINOPHEN 325 MG PO TABS: 650.0000 mg | ORAL_TABLET | Freq: Four times a day (QID) | ORAL | Status: DC | PRN

## 2012-01-17 MED ORDER — ACETAMINOPHEN 650 MG RE SUPP: 650.0000 mg | Freq: Four times a day (QID) | RECTAL | Status: DC | PRN

## 2012-01-17 NOTE — ED Notes (Signed)
Consulting MD at bedside

## 2012-01-17 NOTE — ED Provider Notes (Signed)
History     CSN: [id number]  Arrival date & time 01/17/12  1258   First MD Initiated Contact with Patient 01/17/12 1623      Chief Complaint  Patient presents with  . Abdominal Pain  . Back Pain    (Consider location/radiation/quality/duration/timing/severity/associated sxs/prior treatment) Patient is a 56 y.o. male presenting with abdominal pain and back pain. The history is provided by the patient.  Abdominal Pain The primary symptoms of the illness include abdominal pain. The primary symptoms of the illness do not include fever, shortness of breath, nausea, vomiting, diarrhea, hematochezia or dysuria. Primary symptoms comment: generalized weakness and low back pain The current episode started more than 2 days ago. The onset of the illness was gradual. The problem has not changed since onset. The abdominal pain began 2 days ago. The pain came on gradually. The abdominal pain has been unchanged since its onset. The abdominal pain is located in the epigastric region, left flank and right flank. The abdominal pain does not radiate. The abdominal pain is relieved by nothing.  The patient has not had a change in bowel habit. Risk factors: Sickle Cell. Additional symptoms associated with the illness include back pain. Symptoms associated with the illness do not include chills.  Back Pain  This is a new problem. The current episode started 2 days ago. The problem occurs hourly. The problem has not changed since onset.The pain is associated with no known injury. The pain is present in the lumbar spine. The quality of the pain is described as aching. The pain is moderate. Associated symptoms include abdominal pain and weakness (generalized). Pertinent negatives include no chest pain, no fever, no headaches, no bowel incontinence, no perianal numbness, no bladder incontinence, no dysuria and no tingling. He has tried nothing for the symptoms.    Past Medical History  Diagnosis Date  . Shortness of  breath   . Anemia     sickle cell anemia  . Headache   . Swelling of extremity, right   . Swelling abdomen   . Swelling of extremity, left   . Sickle cell anemia   . CHF (congestive heart failure)   . Blood dyscrasia     SICKLE CELL   . Blood transfusion     " NO REACTION TO TRANSFUSION"    Past Surgical History  Procedure Date  . No past surgeries     Family History  Problem Relation Age of Onset  . Alcohol abuse Mother   . Liver disease Mother   . Cirrhosis Mother     History  Substance Use Topics  . Smoking status: Current Everyday Smoker -- 0.5 packs/day for 25 years    Types: Cigarettes  . Smokeless tobacco: Never Used  . Alcohol Use: No     rare      Review of Systems  Constitutional: Positive for activity change. Negative for fever and chills.  HENT: Negative for congestion.   Respiratory: Negative for cough, chest tightness and shortness of breath.   Cardiovascular: Negative for chest pain and palpitations.  Gastrointestinal: Positive for abdominal pain. Negative for nausea, vomiting, diarrhea, blood in stool, hematochezia and bowel incontinence.  Genitourinary: Negative for bladder incontinence and dysuria.  Musculoskeletal: Positive for back pain.  Neurological: Positive for weakness (generalized). Negative for tingling and headaches.  All other systems reviewed and are negative.    Allergies  Review of patient's allergies indicates no known allergies.  Home Medications   No current outpatient prescriptions on file.  BP 105/64  Pulse 73  Temp 97.8 F (36.6 C) (Oral)  Resp 12  Ht 5\' 10"  (1.778 m)  Wt 146 lb 6.4 oz (66.407 kg)  BMI 21.01 kg/m2  SpO2 97%  Physical Exam  Nursing note and vitals reviewed. Constitutional: He is oriented to person, place, and time. He appears well-developed and well-nourished. No distress.  HENT:  Head: Normocephalic and atraumatic.  Eyes: Conjunctivae are normal.  Neck: Neck supple.  Cardiovascular:  Normal rate, regular rhythm, normal heart sounds and intact distal pulses.   Pulmonary/Chest: Effort normal and breath sounds normal. He has no wheezes. He has no rales.  Abdominal: Soft. He exhibits no distension. There is tenderness (epigastric).  Musculoskeletal: Normal range of motion.  Neurological: He is alert and oriented to person, place, and time.  Skin: Skin is warm and dry.    ED Course  Procedures (including critical care time)  Labs Reviewed  CBC WITH DIFFERENTIAL - Abnormal; Notable for the following:    WBC 10.9 (*)  WHITE COUNT CONFIRMED ON SMEAR   RBC 1.90 (*)     Hemoglobin 6.3 (*)  CRITICAL VALUE NOTED.  VALUE IS CONSISTENT WITH PREVIOUSLY REPORTED AND CALLED VALUE.   HCT 16.4 (*)     MCHC 38.4 (*)  SICKLE CELLS   RDW 25.3 (*)     Monocytes Relative 15 (*)     Eosinophils Relative 7 (*)     nRBC 6 (*)     Monocytes Absolute 1.6 (*)     Eosinophils Absolute 0.8 (*)     All other components within normal limits  BASIC METABOLIC PANEL - Abnormal; Notable for the following:    CO2 18 (*)     BUN 55 (*)     Creatinine, Ser 2.10 (*)     GFR calc non Af Amer 34 (*)     GFR calc Af Amer 40 (*)     All other components within normal limits  URINALYSIS, ROUTINE W REFLEX MICROSCOPIC - Abnormal; Notable for the following:    APPearance CLOUDY (*)     Hgb urine dipstick LARGE (*)     Protein, ur 100 (*)     Leukocytes, UA SMALL (*)     All other components within normal limits  RETICULOCYTES - Abnormal; Notable for the following:    Retic Ct Pct 33.6 (*)  RESULTS CONFIRMED BY MANUAL DILUTION   RBC. 1.90 (*)     Retic Count, Manual 638.4 (*)     All other components within normal limits  PROTIME-INR - Abnormal; Notable for the following:    Prothrombin Time 16.2 (*)     All other components within normal limits  APTT  TYPE AND SCREEN  URINE MICROSCOPIC-ADD ON  OCCULT BLOOD, POC DEVICE  PREPARE RBC (CROSSMATCH)  TYPE AND SCREEN  URINE CULTURE  BASIC METABOLIC  PANEL  CBC  URINE CULTURE  URINALYSIS, ROUTINE W REFLEX MICROSCOPIC   No results found.   1. Abdominal pain   2. Acute on chronic combined systolic and diastolic CHF (congestive heart failure)   3. Anemia   4. Anemia of chronic disease   5. Chronic CHF   6. Sickle cell crisis   7. Tobacco abuse   8. UTI (lower urinary tract infection)       MDM  56 yo male with history of sickle cell disease who presents with 4 day history of b/l flank and low back pain and 2 day history of epigastric pain.  Pt sent by cardiology office for labs drawn today.  No history of fever, cough, shortness of breath, chest pain, vomiting, diarrhea.  AF, VSS, NAD.  Physical exam with epigastric tenderness.  Hgb 6.3, Retic 33.  Will get Type and Screen and hemoccult.  Pt will need admission for transfusion and possible evaluation by GI for gastritis or ulcer.  Will give Protonix in the ED.  Hemoccult negative.  Will admit pt to medicine.  Pt admitted to the care of medicine in stable condition.    Renaldo Reel, MD 01/18/12 [id number]  Renaldo Reel, MD 01/18/12 0040

## 2012-01-17 NOTE — Telephone Encounter (Signed)
Critical lab value received, Hgb 6.  Have reviewed other labs, Bili 5, LDH 1359.  Discussed with Dr. Haroldine Laws who talked to Dr. Marlou Sa and patient should come to the ER for sickle cell crisis.  Have talked to the patient and he will come to [facility] ER for admit.

## 2012-01-17 NOTE — Patient Instructions (Addendum)
Follow up in 1 month  Stop digoxin  Only take lasix if your weight is 153 pounds or greater.   Do the following things EVERYDAY: 1) Weigh yourself in the morning before breakfast. Write it down and keep it in a log. 2) Take your medicines as prescribed 3) Eat low salt foods-Limit salt (sodium) to 2000 mg per day.  4) Stay as active as you can everyday 5) Limit all fluids for the day to less than 2 liters

## 2012-01-17 NOTE — Assessment & Plan Note (Addendum)
NYHA II. Volume status appears low. EF improved now 45-50%. Stop digoxin. Cut lasix back. Take lasix only if weight is 153 pounds or greater. Follow up in 1 month.   Patient seen and examined with [name], NP. We discussed all aspects of the encounter. I agree with the assessment and plan as stated above. Echo reviewed in clinic EF near normal. Renal function creeping up. Will stop lasix and just use for weight 152 or greater. Reinforced need for daily weights and reviewed use of sliding scale diuretics.

## 2012-01-17 NOTE — ED Notes (Signed)
Pt c/o left sided abd pain and back pain; pt told to come here by cards because may be having a sickle cell crisis; pt sts does not feel like sickle cell pain

## 2012-01-17 NOTE — ED Provider Notes (Signed)
56 year old male with sickle cell disease has been having pain in his bilateral flank and midback for the last 4 days. 2 days ago, he started having some intermittent epigastric discomfort. He denies fever, chills, sweats and denies nausea, vomiting, diarrhea. Nothing makes any of his pain is better nothing makes any of his pain is worse. Flank and back pain is typical of his sickle cell flares, but his abdominal pain is different from anything he has had before. On exam, he has mild epigastric tenderness and I suspect his abdominal pain is either gastritis, GERD, or peptic ulcer disease. There is no flank or back tenderness. Hemoglobin is noted to be 6.3 and he has required transfusions in the past.  I feel that he will need to be. admitted for blood transfusion and also will need to be evaluated by gastroenterology with possible endoscopy. I do not see an indication for CT scanning. He will be started on proton pump inhibitor  Delora Fuel, MD [id number]

## 2012-01-17 NOTE — Progress Notes (Signed)
  Echocardiogram 2D Echocardiogram has been performed.  Patrick [name] 01/17/2012, 10:31 AM

## 2012-01-17 NOTE — H&P (Signed)
History and Physical       Hospital Admission Note Date: 01/17/2012  Patient name: Patrick [name] Medical record number: [id number] Date of birth: [birth year] Age: 56 y.o. Gender: male PCP: Elyn Peers, MD  Primary cardiologist: Dr. Haroldine Laws   Chief Complaint:  Abdominal pain, back pain and flank pain off and on for the last 1 week, worsened in the last 2 days  HPI: Patient is a 56 year old male with history of chronic systolic and diastolic combined CHF, chronic anemia (per patient baseline hemoglobin between 7-8), sickle cell disease, presented with generalized weakness, epigastric pain, flank pain, back pain. Patient states that usually with his sickle cell crisis he has flank pain and back pain however the epigastric pain is something new. He otherwise denied any nausea or vomiting, any radiation of epigastric pain. Patient denies taking any NSAIDs recently. At the time of my encounter patient's epigastric pain had subsided. He denied any radiation of epigastric pain, hematemesis or any hematochezia or melena. Patient was seen by his cardiologist, Dr. Haroldine Laws this morning in the clinic, patient had labs done which showed hemoglobin of 6.3 and was sent to the ER for further workup. Stool occult test in the emergency room was negative.  Review of Systems:  Constitutional: Denies fever, chills, diaphoresis, appetite change and fatigue.  HEENT: Denies photophobia, eye pain, redness, hearing loss, ear pain, congestion, sore throat, rhinorrhea, sneezing, mouth sores, trouble swallowing, neck pain, neck stiffness and tinnitus.   Respiratory: Denies SOB, DOE, cough, chest tightness,  and wheezing.   Cardiovascular: Denies chest pain, palpitations and leg swelling.  Gastrointestinal: Please see history of present illness  Genitourinary: Denies dysuria, urgency, hematuria, flank pain and difficulty urinating.  patient states that he has  some increased urinary frequency but he takes Lasix  Musculoskeletal: Please see history of present illness Skin: Denies pallor, rash and wound.  Neurological: Denies dizziness, seizures, syncope, weakness, light-headedness, numbness and headaches.  Hematological: Denies adenopathy. Easy bruising, personal or family bleeding history  Psychiatric/Behavioral: Denies suicidal ideation, mood changes, confusion, nervousness, sleep disturbance and agitation  Past Medical History: Past Medical History  Diagnosis Date  . Shortness of breath   . Anemia     sickle cell anemia  . Headache   . Swelling of extremity, right   . Swelling abdomen   . Swelling of extremity, left   . Sickle cell anemia   . CHF (congestive heart failure)   . Blood dyscrasia     SICKLE CELL   . Blood transfusion     " NO REACTION TO TRANSFUSION"   Past Surgical History  Procedure Date  . No past surgeries     Medications: Prior to Admission medications   Medication Sig Start Date End Date Taking? Authorizing Provider  allopurinol (ZYLOPRIM) 100 MG tablet Take 1 tablet (100 mg total) by mouth daily. 10/24/11 10/23/12 Yes Simbiso Ranga, MD  aspirin EC 81 MG EC tablet Take 1 tablet (81 mg total) by mouth daily. 10/24/11 10/23/12 Yes Simbiso Ranga, MD  carvedilol (COREG) 12.5 MG tablet Take 18.75 mg by mouth 2 (two) times daily with a meal.   Yes Historical Provider, MD  folic acid (FOLVITE) 1 MG tablet Take 2 tablets (2 mg total) by mouth daily. 10/24/11 10/23/12 Yes Simbiso Ranga, MD  furosemide (LASIX) 40 MG tablet Take 1 tablet (40 mg total) by mouth as needed. 01/17/12 01/16/13 Yes Amy D [name], NP  lisinopril (PRINIVIL,ZESTRIL) 10 MG tablet Take 0.5 tablets (5 mg total) by mouth  daily. 12/28/11 12/27/12 Yes Amy D [name], NP    Allergies:  No Known Allergies  Social History:  reports that he has been smoking Cigarettes.  He has a 12.5 pack-year smoking history. He has never used smokeless tobacco. He reports that he drinks  about 4.2 ounces of alcohol per week. He reports that he does not use illicit drugs.  Family History: Family History  Problem Relation Age of Onset  . Alcohol abuse Mother   . Liver disease Mother   . Cirrhosis Mother     Physical Exam: Blood pressure 106/60, pulse 72, temperature 97.6 F (36.4 C), temperature source Oral, resp. rate 17, SpO2 96.00%. General: Alert, awake, oriented x3, in no acute distress. HEENT: normocephalic, atraumatic, anicteric sclera, pink conjunctiva, pupils equal and reactive to light and accomodation, oropharynx clear Neck: supple, no masses or lymphadenopathy, no goiter, no bruits  Heart: Regular rate and rhythm, without murmurs, rubs or gallops. Lungs: Clear to auscultation bilaterally, no wheezing, rales or rhonchi. Abdomen: Soft, nontender, nondistended, positive bowel sounds, no masses. Extremities: No clubbing, cyanosis or edema with positive pedal pulses. Neuro: Grossly intact, no focal neurological deficits, strength 5/5 upper and lower extremities bilaterally Psych: alert and oriented x 3, normal mood and affect Skin: no rashes or lesions, warm and dry   LABS on Admission:  Basic Metabolic Panel:  Lab [id number] 1315 01/17/12 1010  NA 137 137  K 4.4 4.3  CL 104 106  CO2 18* 19  GLUCOSE 99 107*  BUN 55* 53*  CREATININE 2.10* 2.12*  CALCIUM 8.7 8.9  MG -- --  PHOS -- --   Liver Function Tests:  Lab 01/17/12 1010  AST 77*  ALT 28  ALKPHOS 80  BILITOT 5.0*  PROT 6.9  ALBUMIN 3.6   No results found for this basename: LIPASE:2,AMYLASE:2 in the last 168 hours No results found for this basename: AMMONIA:2 in the last 168 hours CBC:  Lab 01/17/12 1315 01/17/12 1010  WBC 10.9* 13.0*  NEUTROABS 6.0 --  HGB 6.3* 6.0*  HCT 16.4* 15.9*  MCV 86.3 --  PLT 170 136*     Radiological Exams on Admission: No results found.  Assessment/Plan Present on Admission:  .Anemia: Most likely acute on chronic anemia chronic disease due to sickle  cell disease unlikely GI issue, stool occult test is negative  - Admit to telemetry, type and cross for 2 units packed RBC transfusion - Continue PPI   .Sickle cell crisis: - Will continue folic acid, pain control, RBC transfusion, oxygen supplementation. - Avoid any fluid overload due to his history of CHF   .Chronic CHF (combined systolic and diastolic ): Currently compensated  - Will give one dose of Lasix in between the packed RBC transfusion   .CKD (chronic kidney disease), stage III: Stable   .Abdominal pain: Improving, Possibly related to acute sickle cell crisis, monitor closely, placed on PPI   .Tobacco abuse: - Patient counseled on tobacco cessation, will place on nicotine patch   .UTI (lower urinary tract infection) - Obtain urine cultures, place on Rocephin  DVT prophylaxis: SCDs  CODE STATUS: Full code  Further plan will depend as patient's clinical course evolves and further radiologic and laboratory data become available.   Time Spent on Admission: 1 hour  Kelly Eisler M.D. [facility] 01/17/2012, 6:31 PM Pager: [phone]  If 7PM-7AM, please contact night-coverage [email or web address] Password [email or web address]

## 2012-01-17 NOTE — Assessment & Plan Note (Addendum)
Intermittent abdominal pain. Check CBC, LDH fractionated bili, and CMET.  Attending: Ab pain of unclear etiology. Ab exam benign.  Has scleral icterus. Will check labs to exclude sickle crisis, etc.

## 2012-01-17 NOTE — Progress Notes (Signed)
Patient ID: TODDY WAUGH, male   DOB: 09-Oct-1955, 56 y.o.   MRN: [id number]  Weight Range   Baseline proBNP   PCP: Dr Criss Rosales  [facility] [employer] [facility]   HPI: Mr. Perch is a 56 y.o. African American male with past medical history significant for sickle cell disease with anemia, NICM, EF 99991111 and systolic heart failure diagnosed 05/2011, no CAD by cath 05/2011, CKD stage III, and tobacco abuse.   Admitted in 12/12 with new onset HF. Diuresed and improved. 05/2011: EF 30-35% range (gloabl HK). RV normal function. Mod LAE. RHC after diuresis.  RA mean 2  RV 24/3  PA 22/6  PCWP mean 8  LV 96/7  AO 100/64  Oxygen saturations:  PA 78%  AO 99%  Cardiac Output (Fick) 10.4  Cardiac Index (Fick) 5.7   Admitted to [facility] on 5/1 with recurrent HF. Diuresed approximately 20 pounds. D/c weight 138 pounds. An echo was repeated on 10/17/11 showed LVEF 30-35%, diffuse hypokinesis, diastolic dysfunction, moderately dilated LA, mildly dilated RA, RV normal function and PA systolic pressure Q000111Q mmHg.   11/13/11 Potassium 5.2 ProBNP 3363 Creatinine 1.20 11/28/11 Potassium 4.2  Creatinine 1.3 01/08/12 Potasssium 5.0 Creatinine 1.9  01/17/12 ECHO 45-50%   He is here for follow up. Last visit Carvedilol increased at night to 18.75 mg twice a day. Complains of abdominal pain and fatigue over the last 2 days.  Intermittent abdominal pain that last 30 minutes at a time.  Denies N/V. Denies SOB/PND/Orthopnea/CP. Denies dizziness. Weight at home 148 pounds. Smokes 4 cigarettes per day. Complaint with medications but out of digoxin. Tries to follow low salt diet.    ROS: All systems negative except as listed in HPI, PMH and Problem List.  Past Medical History  Diagnosis Date  . Shortness of breath   . Anemia     sickle cell anemia  . Headache   . Swelling of extremity, right   . Swelling abdomen   . Swelling of extremity, left   . Sickle cell anemia   . CHF (congestive heart failure)   . Blood dyscrasia       SICKLE CELL   . Blood transfusion     " NO REACTION TO TRANSFUSION"    Current Outpatient Prescriptions  Medication Sig Dispense Refill  . allopurinol (ZYLOPRIM) 100 MG tablet Take 1 tablet (100 mg total) by mouth daily.  30 tablet  0  . aspirin EC 81 MG EC tablet Take 1 tablet (81 mg total) by mouth daily.  30 tablet  0  . carvedilol (COREG) 12.5 MG tablet Take 1 1/2  tab in am and 1 1/2 tabs in pm  90 tablet  0  . furosemide (LASIX) 40 MG tablet Take 1 tablet (40 mg total) by mouth daily.  30 tablet  3  . lisinopril (PRINIVIL,ZESTRIL) 10 MG tablet Take 0.5 tablets (5 mg total) by mouth daily.  30 tablet  0  . digoxin (LANOXIN) 0.125 MG tablet Take 0.125 mg by mouth daily.      . folic acid (FOLVITE) 1 MG tablet Take 2 tablets (2 mg total) by mouth daily.  60 tablet  0     PHYSICAL EXAM: Filed Vitals:   01/17/12 0921  BP: 118/58  Pulse: 64  Weight: 152 lb 6.4 oz (69.128 kg)  SpO2: 94%    General:  Well appearing. No resp difficulty HEENT: normal except for mild scleral icterus Neck: supple. JVP 5-6. Carotids 2+ bilaterally; no bruits. No lymphadenopathy  or thryomegaly appreciated. Cor: PMI normal Regular rate & rhythm. No rubs, gallops or murmurs. Lungs: clear  Abdomen: soft, nontender, nondistended. No hepatosplenomegaly. No bruits or masses. Good bowel sounds. Extremities: no cyanosis, rash, edema + clubbing Neuro: alert & orientedx3, cranial nerves grossly intact. Moves all 4 extremities w/o difficulty. Affect pleasant.   ASSESSMENT & PLAN:

## 2012-01-18 ENCOUNTER — Encounter (HOSPITAL_COMMUNITY): Payer: Self-pay | Admitting: *Deleted

## 2012-01-18 LAB — URINALYSIS, ROUTINE W REFLEX MICROSCOPIC
Bilirubin Urine: NEGATIVE
Glucose, UA: NEGATIVE mg/dL
Ketones, ur: NEGATIVE mg/dL
Leukocytes, UA: NEGATIVE
Nitrite: NEGATIVE
Protein, ur: 100 mg/dL — AB
Specific Gravity, Urine: 1.013 (ref 1.005–1.030)
Urobilinogen, UA: 0.2 mg/dL (ref 0.0–1.0)
pH: 5.5 (ref 5.0–8.0)

## 2012-01-18 LAB — COMPREHENSIVE METABOLIC PANEL
ALT: 19 U/L (ref 0–53)
AST: 61 U/L — ABNORMAL HIGH (ref 0–37)
Albumin: 3.3 g/dL — ABNORMAL LOW (ref 3.5–5.2)
Alkaline Phosphatase: 67 U/L (ref 39–117)
BUN: 51 mg/dL — ABNORMAL HIGH (ref 6–23)
CO2: 19 mEq/L (ref 19–32)
Calcium: 8.4 mg/dL (ref 8.4–10.5)
Chloride: 107 mEq/L (ref 96–112)
Creatinine, Ser: 1.81 mg/dL — ABNORMAL HIGH (ref 0.50–1.35)
GFR calc Af Amer: 48 mL/min — ABNORMAL LOW (ref >90)
GFR calc non Af Amer: 41 mL/min — ABNORMAL LOW (ref >90)
Glucose, Bld: 112 mg/dL — ABNORMAL HIGH (ref 70–99)
Potassium: 4.9 mEq/L (ref 3.5–5.1)
Sodium: 137 mEq/L (ref 135–145)
Total Bilirubin: 3 mg/dL — ABNORMAL HIGH (ref 0.3–1.2)
Total Protein: 6.4 g/dL (ref 6.0–8.3)

## 2012-01-18 LAB — CBC
HCT: 19.6 % — ABNORMAL LOW (ref 39.0–52.0)
Hemoglobin: 7.3 g/dL — ABNORMAL LOW (ref 13.0–17.0)
MCH: 31.5 pg (ref 26.0–34.0)
MCHC: 37.2 g/dL — ABNORMAL HIGH (ref 30.0–36.0)
MCV: 84.5 fL (ref 78.0–100.0)
Platelets: 138 10*3/uL — ABNORMAL LOW (ref 150–400)
RBC: 2.32 MIL/uL — ABNORMAL LOW (ref 4.22–5.81)
RDW: 20.9 % — ABNORMAL HIGH (ref 11.5–15.5)
WBC: 10.6 10*3/uL — ABNORMAL HIGH (ref 4.0–10.5)

## 2012-01-18 LAB — URINE MICROSCOPIC-ADD ON

## 2012-01-18 LAB — URINE CULTURE
Colony Count: NO GROWTH
Culture: NO GROWTH

## 2012-01-18 NOTE — Progress Notes (Signed)
Notified MD of Pt In [place] today. Well continue to monitor

## 2012-01-18 NOTE — Progress Notes (Signed)
Utilization Review Completed.Patrick [name] T8/07/2011   

## 2012-01-18 NOTE — Progress Notes (Signed)
Subjective:  Patient's history is noted. 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 His been followed by my cardiology for congestive failure since May. He had been doing well up until the past 2 weeks. He been noting increasing exertional dyspnea on climbing steps. This been associated with increasing weakness. There is no associated chest pains. He noted more weakness today when seen by Dr. Haroldine Laws of cardiology. He was referred for admission when he was noted to have a hemoglobin of 6.3. Patient also noted increasing low back pain as well. He presently is doing better at this time. He rates his pain as a 6/10.   No Known Allergies Current Facility-Administered Medications  Medication Dose Route Frequency Provider Last Rate Last Dose  . acetaminophen (TYLENOL) tablet 650 mg  650 mg Oral Q6H PRN Ripudeep Krystal Eaton, MD       Or  . acetaminophen (TYLENOL) suppository 650 mg  650 mg Rectal Q6H PRN Ripudeep K Rai, MD      . allopurinol (ZYLOPRIM) tablet 100 mg  100 mg Oral Daily Ripudeep K Rai, MD   100 mg at 01/18/12 1033  . alum & mag hydroxide-simeth (MAALOX/MYLANTA) 200-200-20 MG/5ML suspension 30 mL  30 mL Oral Q6H PRN Ripudeep K Rai, MD      . aspirin EC tablet 81 mg  81 mg Oral Daily Ripudeep Krystal Eaton, MD   81 mg at 01/18/12 1033  . carvedilol (COREG) tablet 18.75 mg  18.75 mg Oral BID WC Ripudeep K Rai, MD   18.75 mg at 01/18/12 1620  . cefTRIAXone (ROCEPHIN) 1 g in dextrose 5 % 50 mL IVPB  1 g Intravenous Q24H Ripudeep K Rai, MD   1 g at 01/18/12 2101  . folic acid (FOLVITE) tablet 2 mg  2 mg Oral Daily Ripudeep K Rai, MD   2 mg at 01/18/12 1033  . furosemide (LASIX) injection 20 mg  20 mg Intravenous Once Ripudeep Krystal Eaton, MD   20 mg at 01/18/12 0125  . HYDROcodone-acetaminophen (NORCO/VICODIN) 5-325 MG per tablet 1-2 tablet  1-2 tablet Oral Q4H PRN Ripudeep Krystal Eaton, MD   2 tablet at 01/18/12 2057  . HYDROmorphone (DILAUDID) injection 1 mg  1 mg Intravenous Q3H PRN  Ripudeep Krystal Eaton, MD   1 mg at 01/18/12 1217  . lisinopril (PRINIVIL,ZESTRIL) tablet 5 mg  5 mg Oral Daily Ripudeep K Rai, MD   5 mg at 01/18/12 1038  . ondansetron (ZOFRAN) tablet 4 mg  4 mg Oral Q6H PRN Ripudeep K Rai, MD       Or  . ondansetron (ZOFRAN) injection 4 mg  4 mg Intravenous Q6H PRN Ripudeep K Rai, MD      . pantoprazole (PROTONIX) EC tablet 40 mg  40 mg Oral Q0600 Ripudeep Krystal Eaton, MD   40 mg at 01/18/12 [id number]  . sodium chloride 0.9 % injection 3 mL  3 mL Intravenous Q12H Ripudeep K Rai, MD   3 mL at 01/18/12 2101    Objective: Blood pressure 99/60, pulse 64, temperature 98 F (36.7 C), temperature source Oral, resp. rate 18, height 5\' 10"  (1.778 m), weight 147 lb 1.6 oz (66.724 kg), SpO2 93.00%.  Well-developed well-nourished black male presently in no acute distress. HEENT: No sinus tenderness. Muscular this. NECK: No enlarged thyroid. No posterior cervical nodes. LUNGS: No wheezes or rales. Decreased breath sounds at bases. No rub. No vocal fremitus. CV: Normal S1, S2 without S3. One of 6 systolic  ejection murmur no lower left sternal border. ABD: Soft, nontender. MSK: Tenderness in the lumbar sacral spine. Negative Homans no edema. NEURO: Nonfocal.  Lab results: Results for orders placed during the hospital encounter of 01/17/12 (from the past 48 hour(s))  CBC WITH DIFFERENTIAL     Status: Abnormal   Collection Time   01/17/12  1:15 PM      Component Value Range Comment   WBC 10.9 (*) 4.0 - 10.5 K/uL WHITE COUNT CONFIRMED ON SMEAR   RBC 1.90 (*) 4.22 - 5.81 MIL/uL    Hemoglobin 6.3 (*) 13.0 - 17.0 g/dL CRITICAL VALUE NOTED.  VALUE IS CONSISTENT WITH PREVIOUSLY REPORTED AND CALLED VALUE.   HCT 16.4 (*) 39.0 - 52.0 %    MCV 86.3  78.0 - 100.0 fL    MCH 33.2  26.0 - 34.0 pg    MCHC 38.4 (*) 30.0 - 36.0 g/dL SICKLE CELLS   RDW 25.3 (*) 11.5 - 15.5 %    Platelets 170  150 - 400 K/uL PLATELET COUNT CONFIRMED BY SMEAR   Neutrophils Relative 55  43 - 77 %    Lymphocytes  Relative 22  12 - 46 %    Monocytes Relative 15 (*) 3 - 12 %    Eosinophils Relative 7 (*) 0 - 5 %    Basophils Relative 1  0 - 1 %    nRBC 6 (*) 0 /100 WBC    Neutro Abs 6.0  1.7 - 7.7 K/uL    Lymphs Abs 2.4  0.7 - 4.0 K/uL    Monocytes Absolute 1.6 (*) 0.1 - 1.0 K/uL    Eosinophils Absolute 0.8 (*) 0.0 - 0.7 K/uL    Basophils Absolute 0.1  0.0 - 0.1 K/uL    RBC Morphology SICKLE CELLS      Smear Review LARGE PLATELETS PRESENT     BASIC METABOLIC PANEL     Status: Abnormal   Collection Time   01/17/12  1:15 PM      Component Value Range Comment   Sodium 137  135 - 145 mEq/L    Potassium 4.4  3.5 - 5.1 mEq/L    Chloride 104  96 - 112 mEq/L    CO2 18 (*) 19 - 32 mEq/L    Glucose, Bld 99  70 - 99 mg/dL    BUN 55 (*) 6 - 23 mg/dL    Creatinine, Ser 2.10 (*) 0.50 - 1.35 mg/dL    Calcium 8.7  8.4 - 10.5 mg/dL    GFR calc non Af Amer 34 (*) >90 mL/min    GFR calc Af Amer 40 (*) >90 mL/min   RETICULOCYTES     Status: Abnormal   Collection Time   01/17/12  1:15 PM      Component Value Range Comment   Retic Ct Pct 33.6 (*) 0.4 - 3.1 % RESULTS CONFIRMED BY MANUAL DILUTION   RBC. 1.90 (*) 4.22 - 5.81 MIL/uL    Retic Count, Manual 638.4 (*) 19.0 - 186.0 K/uL   PROTIME-INR     Status: Abnormal   Collection Time   01/17/12  1:15 PM      Component Value Range Comment   Prothrombin Time 16.2 (*) 11.6 - 15.2 seconds    INR 1.27  0.00 - 1.49   APTT     Status: Normal   Collection Time   01/17/12  1:15 PM      Component Value Range Comment   aPTT 34  24 -  37 seconds   URINALYSIS, ROUTINE W REFLEX MICROSCOPIC     Status: Abnormal   Collection Time   01/17/12  3:56 PM      Component Value Range Comment   Color, Urine YELLOW  YELLOW    APPearance CLOUDY (*) CLEAR    Specific Gravity, Urine 1.013  1.005 - 1.030    pH 5.5  5.0 - 8.0    Glucose, UA NEGATIVE  NEGATIVE mg/dL    Hgb urine dipstick LARGE (*) NEGATIVE    Bilirubin Urine NEGATIVE  NEGATIVE    Ketones, ur NEGATIVE  NEGATIVE mg/dL     Protein, ur 100 (*) NEGATIVE mg/dL    Urobilinogen, UA 1.0  0.0 - 1.0 mg/dL    Nitrite NEGATIVE  NEGATIVE    Leukocytes, UA SMALL (*) NEGATIVE   URINE MICROSCOPIC-ADD ON     Status: Normal   Collection Time   01/17/12  3:56 PM      Component Value Range Comment   Squamous Epithelial / LPF RARE  RARE    WBC, UA 7-10  <3 WBC/hpf    RBC / HPF 0-2  <3 RBC/hpf RESULT CHECKED.   Bacteria, UA RARE  RARE   TYPE AND SCREEN     Status: Normal (Preliminary result)   Collection Time   01/17/12  5:05 PM      Component Value Range Comment   ABO/RH(D) AB POS      Antibody Screen POS      Sample Expiration 01/20/2012      Antibody Identification WARM AUTOANTIBODY      DAT, IgG POS      Antibody ID,T [place] WARM AUTOANTIBODY      Unit Number [id number]      Blood Component Type RED CELLS,LR      Unit division 00      Status of Unit ISSUED,FINAL      Donor AG Type        Value: NEGATIVE FOR C ANTIGEN NEGATIVE FOR E ANTIGEN NEGATIVE FOR KELL ANTIGEN   Transfusion Status OK TO TRANSFUSE      Crossmatch Result COMPATIBLE      Unit Number [id number]      Blood Component Type RED CELLS,LR      Unit division 00      Status of Unit ISSUED      Donor AG Type        Value: NEGATIVE FOR C ANTIGEN NEGATIVE FOR E ANTIGEN NEGATIVE FOR KELL ANTIGEN   Transfusion Status OK TO TRANSFUSE      Crossmatch Result COMPATIBLE     OCCULT BLOOD, POC DEVICE     Status: Normal   Collection Time   01/17/12  5:05 PM      Component Value Range Comment   Fecal Occult Bld NEGATIVE     PREPARE RBC (CROSSMATCH)     Status: Normal   Collection Time   01/17/12  7:00 PM      Component Value Range Comment   Order Confirmation ORDER PROCESSED BY BLOOD BANK     URINALYSIS, ROUTINE W REFLEX MICROSCOPIC     Status: Abnormal   Collection Time   01/18/12  1:35 AM      Component Value Range Comment   Color, Urine YELLOW  YELLOW    APPearance CLOUDY (*) CLEAR    Specific Gravity, Urine 1.013  1.005 - 1.030    pH 5.5  5.0 - 8.0     Glucose, UA NEGATIVE  NEGATIVE mg/dL  Hgb urine dipstick LARGE (*) NEGATIVE    Bilirubin Urine NEGATIVE  NEGATIVE    Ketones, ur NEGATIVE  NEGATIVE mg/dL    Protein, ur 100 (*) NEGATIVE mg/dL    Urobilinogen, UA 0.2  0.0 - 1.0 mg/dL    Nitrite NEGATIVE  NEGATIVE    Leukocytes, UA NEGATIVE  NEGATIVE   URINE MICROSCOPIC-ADD ON     Status: Abnormal   Collection Time   01/18/12  1:35 AM      Component Value Range Comment   WBC, UA 3-6  <3 WBC/hpf    RBC / HPF 7-10  <3 RBC/hpf    Bacteria, UA RARE  RARE    Casts HYALINE CASTS (*) NEGATIVE   CBC     Status: Abnormal   Collection Time   01/18/12  8:49 AM      Component Value Range Comment   WBC 10.6 (*) 4.0 - 10.5 K/uL ADJUSTED FOR NUCLEATED RBC'S   RBC 2.32 (*) 4.22 - 5.81 MIL/uL    Hemoglobin 7.3 (*) 13.0 - 17.0 g/dL    HCT 19.6 (*) 39.0 - 52.0 %    MCV 84.5  78.0 - 100.0 fL    MCH 31.5  26.0 - 34.0 pg    MCHC 37.2 (*) 30.0 - 36.0 g/dL SICKLE CELLS   RDW 20.9 (*) 11.5 - 15.5 %    Platelets 138 (*) 150 - 400 K/uL PLATELET COUNT CONFIRMED BY SMEAR  COMPREHENSIVE METABOLIC PANEL     Status: Abnormal   Collection Time   01/18/12  8:49 AM      Component Value Range Comment   Sodium 137  135 - 145 mEq/L    Potassium 4.9  3.5 - 5.1 mEq/L    Chloride 107  96 - 112 mEq/L    CO2 19  19 - 32 mEq/L    Glucose, Bld 112 (*) 70 - 99 mg/dL    BUN 51 (*) 6 - 23 mg/dL    Creatinine, Ser 1.81 (*) 0.50 - 1.35 mg/dL    Calcium 8.4  8.4 - 10.5 mg/dL    Total Protein 6.4  6.0 - 8.3 g/dL    Albumin 3.3 (*) 3.5 - 5.2 g/dL    AST 61 (*) 0 - 37 U/L HEMOLYSIS AT THIS LEVEL MAY AFFECT RESULT   ALT 19  0 - 53 U/L    Alkaline Phosphatase 67  39 - 117 U/L    Total Bilirubin 3.0 (*) 0.3 - 1.2 mg/dL    GFR calc non Af Amer 41 (*) >90 mL/min    GFR calc Af Amer 48 (*) >90 mL/min     Studies/Results: No results found.  Patient Active Problem List  Diagnosis  . HTN (hypertension)  . Anemia of chronic disease  . Acute on chronic combined systolic and  diastolic CHF (congestive heart failure)  . CKD (chronic kidney disease), stage III  . Gout  . NSVT (nonsustained ventricular tachycardia)  . Chronic diastolic heart failure  . Chronic systolic heart failure  . Tobacco abuse  . Abdominal pain  . Anemia  . Sickle cell crisis  . Chronic CHF  . UTI (lower urinary tract infection)    Impression: Hemoglobin SS disease with crisis. Symptomatic anemia with progressive weakness, improved after transfusion. Acute on chronic combined systolic and diastolic heart. Previously compensated. compensated. Mild leukocytosis. Probably reactive. Rule out urinary tract infection. Patient on Rocephin. Chronic kidney disease. History of gout. History of tobacco abuse.   Plan: Continue present  therapy. Followup CBC, CMET, pro BNP, CRP., Uric acid. Followup urine culture. Discharge home pain manageable and hemoglobin stable. Monitor for indications for exchange transfusion. Incentive spirometry.  Marlou Sa, Ameli Sangiovanni 01/18/2012 9:08 PM

## 2012-01-18 NOTE — ED Provider Notes (Signed)
I saw and evaluated the patient, reviewed the resident's note and I agree with the findings and plan.   Delora Fuel, [place] [id number]

## 2012-01-18 NOTE — Plan of Care (Signed)
Discussed with Dr. Kevan Ny in detail who is aware of the patient, [name] as Dr. Haroldine Laws had also relayed the patient's case to him. Dr Marlou Sa has accepted the patient to his service. [facility] will sign off.      Remell Giaimo M.D. [facility] 01/18/2012, 7:28 AM  Pager: [phone]

## 2012-01-19 LAB — COMPREHENSIVE METABOLIC PANEL
ALT: 22 U/L (ref 0–53)
AST: 62 U/L — ABNORMAL HIGH (ref 0–37)
Albumin: 3.6 g/dL (ref 3.5–5.2)
Alkaline Phosphatase: 70 U/L (ref 39–117)
BUN: 55 mg/dL — ABNORMAL HIGH (ref 6–23)
CO2: 17 mEq/L — ABNORMAL LOW (ref 19–32)
Calcium: 8.6 mg/dL (ref 8.4–10.5)
Chloride: 106 mEq/L (ref 96–112)
Creatinine, Ser: 1.92 mg/dL — ABNORMAL HIGH (ref 0.50–1.35)
GFR calc Af Amer: 44 mL/min — ABNORMAL LOW (ref >90)
GFR calc non Af Amer: 38 mL/min — ABNORMAL LOW (ref >90)
Glucose, Bld: 85 mg/dL (ref 70–99)
Potassium: 4.7 mEq/L (ref 3.5–5.1)
Sodium: 135 mEq/L (ref 135–145)
Total Bilirubin: 2.6 mg/dL — ABNORMAL HIGH (ref 0.3–1.2)
Total Protein: 7 g/dL (ref 6.0–8.3)

## 2012-01-19 LAB — CBC
HCT: 21.6 % — ABNORMAL LOW (ref 39.0–52.0)
Hemoglobin: 7.9 g/dL — ABNORMAL LOW (ref 13.0–17.0)
MCH: 30.9 pg (ref 26.0–34.0)
MCHC: 36.6 g/dL — ABNORMAL HIGH (ref 30.0–36.0)
MCV: 84.4 fL (ref 78.0–100.0)
Platelets: ADEQUATE 10*3/uL (ref 150–400)
RBC: 2.56 MIL/uL — ABNORMAL LOW (ref 4.22–5.81)
RDW: 21.4 % — ABNORMAL HIGH (ref 11.5–15.5)
WBC: 10.5 10*3/uL (ref 4.0–10.5)

## 2012-01-19 LAB — PRO B NATRIURETIC PEPTIDE: Pro B Natriuretic peptide (BNP): 993.1 pg/mL — ABNORMAL HIGH (ref 0–125)

## 2012-01-19 NOTE — Progress Notes (Signed)
Subjective:  Patient denies any chest pain or shortness of breath states backside and abdominal pain has improved  Objective:  Vital Signs in the last 24 hours: Temp:  [97.3 F (36.3 C)-98.3 F (36.8 C)] 98.3 F (36.8 C) (08/03 0515) Pulse Rate:  [61-68] 61  (08/03 0515) Resp:  [18] 18  (08/03 0515) BP: (99-121)/(60-79) 100/62 mmHg (08/03 0515) SpO2:  [92 %-96 %] 96 % (08/03 0515) Weight:  [66.724 kg (147 lb 1.6 oz)] 66.724 kg (147 lb 1.6 oz) (08/03 0515)  Intake/Output from previous day: 08/02 0701 - 08/03 0700 In: 720 [P.O.:720] Out: 1810 [Urine:1810] Intake/Output from this shift: Total I/O In: 360 [P.O.:360] Out: 300 [Urine:300]  Physical Exam: Neck: no adenopathy, no carotid bruit, no JVD and supple, symmetrical, trachea midline Lungs: Decreased breath sound at bases Heart: regular rate and rhythm, S1, S2 normal and 3/6 systolic murmur noted Abdomen: soft, non-tender; bowel sounds normal; no masses,  no organomegaly Extremities: extremities normal, atraumatic, no cyanosis or edema  Lab Results:  [facility] 01/19/12 0638 01/18/12 0849  WBC 10.5 10.6*  HGB 7.9* 7.3*  PLT PLATELET CLUMPS NOTED ON SMEAR, COUNT APPEARS ADEQUATE 138*    Basename 01/19/12 0638 01/18/12 0849  NA 135 137  K 4.7 4.9  CL 106 107  CO2 17* 19  GLUCOSE 85 112*  BUN 55* 51*  CREATININE 1.92* 1.81*   No results found for this basename: TROPONINI:2,CK,MB:2 in the last 72 hours Hepatic Function Panel  Basename 01/19/12 0638 01/17/12 1010  PROT 7.0 --  ALBUMIN 3.6 --  AST 62* --  ALT 22 --  ALKPHOS 70 --  BILITOT 2.6* --  BILIDIR -- 0.5*  IBILI -- --   No results found for this basename: CHOL in the last 72 hours No results found for this basename: PROTIME in the last 72 hours  Imaging: Imaging results have been reviewed and No results found.  Cardiac Studies:  Assessment/Plan:  Resolving sickle cell crisis Resolving acute on chronic combine systolic high output heart failure  secondary to anemia Anemia Chronic kidney disease stage IV History of gouty arthritis History of tobacco abuse Increase LFTs Probable UTI Plan Include present management  LOS: 2 days    Aixa Corsello N 01/19/2012, 12:01 PM

## 2012-01-20 LAB — COMPREHENSIVE METABOLIC PANEL
ALT: 17 U/L (ref 0–53)
AST: 55 U/L — ABNORMAL HIGH (ref 0–37)
Albumin: 3.2 g/dL — ABNORMAL LOW (ref 3.5–5.2)
Alkaline Phosphatase: 66 U/L (ref 39–117)
BUN: 59 mg/dL — ABNORMAL HIGH (ref 6–23)
CO2: 17 mEq/L — ABNORMAL LOW (ref 19–32)
Calcium: 8.5 mg/dL (ref 8.4–10.5)
Chloride: 104 mEq/L (ref 96–112)
Creatinine, Ser: 1.98 mg/dL — ABNORMAL HIGH (ref 0.50–1.35)
GFR calc Af Amer: 43 mL/min — ABNORMAL LOW (ref >90)
GFR calc non Af Amer: 37 mL/min — ABNORMAL LOW (ref >90)
Glucose, Bld: 90 mg/dL (ref 70–99)
Potassium: 4.9 mEq/L (ref 3.5–5.1)
Sodium: 134 mEq/L — ABNORMAL LOW (ref 135–145)
Total Bilirubin: 2.3 mg/dL — ABNORMAL HIGH (ref 0.3–1.2)
Total Protein: 6.4 g/dL (ref 6.0–8.3)

## 2012-01-20 LAB — CBC
HCT: 18.4 % — ABNORMAL LOW (ref 39.0–52.0)
Hemoglobin: 6.7 g/dL — CL (ref 13.0–17.0)
MCH: 30.5 pg (ref 26.0–34.0)
MCHC: 36.4 g/dL — ABNORMAL HIGH (ref 30.0–36.0)
MCV: 83.6 fL (ref 78.0–100.0)
Platelets: 122 10*3/uL — ABNORMAL LOW (ref 150–400)
RBC: 2.2 MIL/uL — ABNORMAL LOW (ref 4.22–5.81)
RDW: 21 % — ABNORMAL HIGH (ref 11.5–15.5)
WBC: 8.9 10*3/uL (ref 4.0–10.5)

## 2012-01-20 LAB — PREPARE RBC (CROSSMATCH)

## 2012-01-20 NOTE — Progress Notes (Signed)
Subjective:  Patient denies any chest pain shortness of breath states abdominal pain has resolved  Objective:  Vital Signs in the last 24 hours: Temp:  [97.9 F (36.6 C)-98.1 F (36.7 C)] 97.9 F (36.6 C) (08/04 0519) Pulse Rate:  [59-63] 63  (08/04 0641) Resp:  [18-19] 18  (08/04 0519) BP: (96-109)/(57-64) 103/58 mmHg (08/04 0641) SpO2:  [94 %-95 %] 94 % (08/04 0519) Weight:  [66.906 kg (147 lb 8 oz)] 66.906 kg (147 lb 8 oz) (08/04 0519)  Intake/Output from previous day: 08/03 0701 - 08/04 0700 In: 1120 [P.O.:1020; IV Piggyback:100] Out: 2550 [Urine:2550] Intake/Output from this shift: Total I/O In: 360 [P.O.:360] Out: -   Physical Exam: Neck: no adenopathy, no carotid bruit, no JVD and supple, symmetrical, trachea midline Lungs: Decreased breath sound at bases Heart: regular rate and rhythm, S1, S2 normal and 3/6 systolic murmur noted Abdomen: soft, non-tender; bowel sounds normal; no masses,  no organomegaly Extremities: extremities normal, atraumatic, no cyanosis or edema  Lab Results:  Basename 01/20/12 0630 01/19/12 0638  WBC 8.9 10.5  HGB 6.7* 7.9*  PLT 122* PLATELET CLUMPS NOTED ON SMEAR, COUNT APPEARS ADEQUATE    Basename 01/20/12 0630 01/19/12 0638  NA 134* 135  K 4.9 4.7  CL 104 106  CO2 17* 17*  GLUCOSE 90 85  BUN 59* 55*  CREATININE 1.98* 1.92*   No results found for this basename: TROPONINI:2,CK,MB:2 in the last 72 hours Hepatic Function Panel  Basename 01/20/12 0630 01/17/12 1010  PROT 6.4 --  ALBUMIN 3.2* --  AST 55* --  ALT 17 --  ALKPHOS 66 --  BILITOT 2.3* --  BILIDIR -- 0.5*  IBILI -- --   No results found for this basename: CHOL in the last 72 hours No results found for this basename: PROTIME in the last 72 hours  Imaging: Imaging results have been reviewed and No results found.  Cardiac Studies:  Assessment/Plan:  Status post sickle cell crisis  Resolving acute on chronic combine systolic high output heart failure secondary  to anemia  Acute on chronic anemia  Chronic kidney disease stage IV  History of gouty arthritis  History of tobacco abuse  Increase LFTs  Probable UTI  Plan And crossmatch and transfuse one unit of packed RBC Check labs in a.m.  LOS: 3 days    Marliss Buttacavoli N 01/20/2012, 9:53 AM

## 2012-01-21 DIAGNOSIS — I5032 Chronic diastolic (congestive) heart failure: Secondary | ICD-10-CM

## 2012-01-21 DIAGNOSIS — N183 Chronic kidney disease, stage 3 unspecified: Secondary | ICD-10-CM

## 2012-01-21 LAB — TYPE AND SCREEN
ABO/RH(D): AB POS
Antibody Screen: POSITIVE
DAT, IgG: POSITIVE
Unit division: 0
Unit division: 0
Unit division: 0

## 2012-01-21 LAB — CBC
HCT: 20.1 % — ABNORMAL LOW (ref 39.0–52.0)
Hemoglobin: 7.3 g/dL — ABNORMAL LOW (ref 13.0–17.0)
MCH: 30.5 pg (ref 26.0–34.0)
MCHC: 36.3 g/dL — ABNORMAL HIGH (ref 30.0–36.0)
MCV: 84.1 fL (ref 78.0–100.0)
Platelets: 131 10*3/uL — ABNORMAL LOW (ref 150–400)
RBC: 2.39 MIL/uL — ABNORMAL LOW (ref 4.22–5.81)
RDW: 19.6 % — ABNORMAL HIGH (ref 11.5–15.5)
WBC: 9.7 10*3/uL (ref 4.0–10.5)

## 2012-01-21 LAB — COMPREHENSIVE METABOLIC PANEL
ALT: 17 U/L (ref 0–53)
AST: 58 U/L — ABNORMAL HIGH (ref 0–37)
Albumin: 3.2 g/dL — ABNORMAL LOW (ref 3.5–5.2)
Alkaline Phosphatase: 65 U/L (ref 39–117)
BUN: 60 mg/dL — ABNORMAL HIGH (ref 6–23)
CO2: 16 mEq/L — ABNORMAL LOW (ref 19–32)
Calcium: 8.6 mg/dL (ref 8.4–10.5)
Chloride: 109 mEq/L (ref 96–112)
Creatinine, Ser: 1.91 mg/dL — ABNORMAL HIGH (ref 0.50–1.35)
GFR calc Af Amer: 45 mL/min — ABNORMAL LOW (ref >90)
GFR calc non Af Amer: 38 mL/min — ABNORMAL LOW (ref >90)
Glucose, Bld: 95 mg/dL (ref 70–99)
Potassium: 5.3 mEq/L — ABNORMAL HIGH (ref 3.5–5.1)
Sodium: 138 mEq/L (ref 135–145)
Total Bilirubin: 2.3 mg/dL — ABNORMAL HIGH (ref 0.3–1.2)
Total Protein: 6.5 g/dL (ref 6.0–8.3)

## 2012-01-21 NOTE — Progress Notes (Signed)
Subjective:  Patient is feeling better overall. He denies chest pains or shortness of breath. He did experience lower normal pain this evening. Pain was sharp aching in nature. Did not radiate. He denies any low back pain. Denies any voiding difficulties or increased urinary frequency. He feels his other pain is manageable at home.No other new complaints. Hope to go home in am.   No Known Allergies Current Facility-Administered Medications  Medication Dose Route Frequency Provider Last Rate Last Dose  . acetaminophen (TYLENOL) tablet 650 mg  650 mg Oral Q6H PRN Ripudeep Krystal Eaton, MD       Or  . acetaminophen (TYLENOL) suppository 650 mg  650 mg Rectal Q6H PRN Ripudeep K Rai, MD      . allopurinol (ZYLOPRIM) tablet 100 mg  100 mg Oral Daily Ripudeep K Rai, MD   100 mg at 01/21/12 1050  . alum & mag hydroxide-simeth (MAALOX/MYLANTA) 200-200-20 MG/5ML suspension 30 mL  30 mL Oral Q6H PRN Ripudeep K Rai, MD      . aspirin EC tablet 81 mg  81 mg Oral Daily Ripudeep K Rai, MD   81 mg at 01/21/12 1050  . carvedilol (COREG) tablet 18.75 mg  18.75 mg Oral BID WC Ripudeep Krystal Eaton, MD   18.75 mg at 01/21/12 1836  . cefTRIAXone (ROCEPHIN) 1 g in dextrose 5 % 50 mL IVPB  1 g Intravenous Q24H Ripudeep K Rai, MD   1 g at 01/20/12 2157  . folic acid (FOLVITE) tablet 2 mg  2 mg Oral Daily Ripudeep K Rai, MD   2 mg at 01/21/12 1049  . HYDROcodone-acetaminophen (NORCO/VICODIN) 5-325 MG per tablet 1-2 tablet  1-2 tablet Oral Q4H PRN Ripudeep Krystal Eaton, MD   2 tablet at 01/21/12 0858  . HYDROmorphone (DILAUDID) injection 1 mg  1 mg Intravenous Q3H PRN Ripudeep Krystal Eaton, MD   1 mg at 01/20/12 2330  . ondansetron (ZOFRAN) tablet 4 mg  4 mg Oral Q6H PRN Ripudeep K Rai, MD       Or  . ondansetron (ZOFRAN) injection 4 mg  4 mg Intravenous Q6H PRN Ripudeep K Rai, MD      . pantoprazole (PROTONIX) EC tablet 40 mg  40 mg Oral Q0600 Ripudeep Krystal Eaton, MD   40 mg at 01/21/12 0640  . sodium chloride 0.9 % injection 3 mL  3 mL Intravenous  Q12H Ripudeep K Rai, MD   3 mL at 01/21/12 1050  . DISCONTD: lisinopril (PRINIVIL,ZESTRIL) tablet 5 mg  5 mg Oral Daily Ripudeep K Rai, MD   5 mg at 01/20/12 1013    Objective: Blood pressure 114/74, pulse 67, temperature 98.1 F (36.7 C), temperature source Oral, resp. rate 19, height 5\' 10"  (1.778 m), weight 147 lb 8 oz (66.906 kg), SpO2 93.00%.  Well-developed well-nourished black male in no acute distress. Patient resting comfortably. HEENT:no sinus tenderness. No sclera icterus. NECK:no enlarged thyroid. No posterior cervical nodes. LUNGS:clear to auscultation. No vocal fremitus. [id number] S1, S2 without S3. 1/6 systolic ejection murmur heard loudest in second left ICS. [id number] sounds are present. Suprapubic tenderness to deep palpation. No masses appreciated. [id number] Homans. No edema. NEURO:intact.  Lab results: Results for orders placed during the hospital encounter of 01/17/12 (from the past 48 hour(s))  CBC     Status: Abnormal   Collection Time   01/20/12  6:30 AM      Component Value Range Comment   WBC 8.9  4.0 - 10.5 K/uL  RBC 2.20 (*) 4.22 - 5.81 MIL/uL    Hemoglobin 6.7 (*) 13.0 - 17.0 g/dL    HCT 18.4 (*) 39.0 - 52.0 %    MCV 83.6  78.0 - 100.0 fL    MCH 30.5  26.0 - 34.0 pg    MCHC 36.4 (*) 30.0 - 36.0 g/dL    RDW 21.0 (*) 11.5 - 15.5 %    Platelets 122 (*) 150 - 400 K/uL PLATELET COUNT CONFIRMED BY SMEAR  COMPREHENSIVE METABOLIC PANEL     Status: Abnormal   Collection Time   01/20/12  6:30 AM      Component Value Range Comment   Sodium 134 (*) 135 - 145 mEq/L    Potassium 4.9  3.5 - 5.1 mEq/L    Chloride 104  96 - 112 mEq/L    CO2 17 (*) 19 - 32 mEq/L    Glucose, Bld 90  70 - 99 mg/dL    BUN 59 (*) 6 - 23 mg/dL    Creatinine, Ser 1.98 (*) 0.50 - 1.35 mg/dL    Calcium 8.5  8.4 - 10.5 mg/dL    Total Protein 6.4  6.0 - 8.3 g/dL    Albumin 3.2 (*) 3.5 - 5.2 g/dL    AST 55 (*) 0 - 37 U/L    ALT 17  0 - 53 U/L    Alkaline Phosphatase 66  39 - 117 U/L      Total Bilirubin 2.3 (*) 0.3 - 1.2 mg/dL    GFR calc non Af Amer 37 (*) >90 mL/min    GFR calc Af Amer 43 (*) >90 mL/min   PREPARE RBC (CROSSMATCH)     Status: Normal   Collection Time   01/20/12 10:00 AM      Component Value Range Comment   Order Confirmation ORDER PROCESSED BY BLOOD BANK     CBC     Status: Abnormal   Collection Time   01/21/12  6:18 AM      Component Value Range Comment   WBC 9.7  4.0 - 10.5 K/uL WHITE COUNT CONFIRMED ON SMEAR   RBC 2.39 (*) 4.22 - 5.81 MIL/uL    Hemoglobin 7.3 (*) 13.0 - 17.0 g/dL    HCT 20.1 (*) 39.0 - 52.0 %    MCV 84.1  78.0 - 100.0 fL    MCH 30.5  26.0 - 34.0 pg    MCHC 36.3 (*) 30.0 - 36.0 g/dL    RDW 19.6 (*) 11.5 - 15.5 %    Platelets 131 (*) 150 - 400 K/uL PLATELET COUNT CONFIRMED BY SMEAR  COMPREHENSIVE METABOLIC PANEL     Status: Abnormal   Collection Time   01/21/12  6:18 AM      Component Value Range Comment   Sodium 138  135 - 145 mEq/L    Potassium 5.3 (*) 3.5 - 5.1 mEq/L    Chloride 109  96 - 112 mEq/L    CO2 16 (*) 19 - 32 mEq/L    Glucose, Bld 95  70 - 99 mg/dL    BUN 60 (*) 6 - 23 mg/dL    Creatinine, Ser 1.91 (*) 0.50 - 1.35 mg/dL    Calcium 8.6  8.4 - 10.5 mg/dL    Total Protein 6.5  6.0 - 8.3 g/dL    Albumin 3.2 (*) 3.5 - 5.2 g/dL    AST 58 (*) 0 - 37 U/L    ALT 17  0 - 53 U/L  Alkaline Phosphatase 65  39 - 117 U/L    Total Bilirubin 2.3 (*) 0.3 - 1.2 mg/dL    GFR calc non Af Amer 38 (*) >90 mL/min    GFR calc Af Amer 45 (*) >90 mL/min     Studies/Results: No results found.  Patient Active Problem List  Diagnosis  . HTN (hypertension)  . Anemia of chronic disease  . Acute on chronic combined systolic and diastolic CHF (congestive heart failure)  . CKD (chronic kidney disease), stage III  . Gout  . NSVT (nonsustained ventricular tachycardia)  . Chronic diastolic heart failure  . Chronic systolic heart failure  . Tobacco abuse  . Abdominal pain  . Anemia  . Sickle cell crisis  . Chronic CHF  . UTI  (lower urinary tract infection)    Impression: Resolving sickle cell crisis. Acute combined systolic and diastolic congestive heart failure. Presently compensated. Lower abdominal pain of questionable etiology. Patient presently on Rocephin. Chronic kidney disease, stage III. Recent urinary tract infection.   Plan: Repeat urinalysis. Pelvic film in a.m. Followup CBC, CMET. Anticipate discharge [place] urinalysis Unremarkable.   Marlou Sa, Merelin Human 01/21/2012 8:37 PM

## 2012-01-21 NOTE — Progress Notes (Signed)
Advanced Heart Failure Rounding Note   Subjective:    [name] is a 56 y.o. African American male with past medical history significant for sickle cell disease with anemia, NICM, EF Q000111Q and systolic heart failure diagnosed 05/2011, no CAD by cath 05/2011, CKD stage III, and tobacco abuse.   Admitted 8/1 for sickle cell crisis.  Hgb stable s/p transfusion of 3 units since admit (last yesterday).    Weight stable 147.  Feels better. More energy.  Abdominal pain resolved. Weight stable. No orthopnea or PND.  SBP 87-118  Objective:   Weight Range:  Vital Signs:   Temp:  [97.8 F (36.6 C)-98.6 F (37 C)] 98.6 F (37 C) (08/05 0427) Pulse Rate:  [62-76] 67  (08/05 0427) Resp:  [16-19] 16  (08/05 0427) BP: (87-118)/(46-71) 107/71 mmHg (08/05 0427) SpO2:  [92 %-94 %] 94 % (08/05 0427) Weight:  [66.906 kg (147 lb 8 oz)] 66.906 kg (147 lb 8 oz) (08/05 0427) Last BM Date: 01/20/12  Weight change: Filed Weights   01/19/12 0515 01/20/12 0519 01/21/12 0427  Weight: 66.724 kg (147 lb 1.6 oz) 66.906 kg (147 lb 8 oz) 66.906 kg (147 lb 8 oz)    Intake/Output:   Intake/Output Summary (Last 24 hours) at 01/21/12 0939 Last data filed at 01/21/12 [id number]  Gross per 24 hour  Intake    885 ml  Output   1775 ml  Net   -890 ml     Physical Exam: General:  Well appearing. No resp difficulty HEENT: normal. Scleral icterus resolved Neck: supple. JVP 6-7 . Carotids 2+ bilat; no bruits. No lymphadenopathy or thryomegaly appreciated. Cor: PMI nondisplaced. Regular rate & rhythm. No rubs, gallops. 2/6 TR Lungs: clear Abdomen: soft, nontender, nondistended. No hepatosplenomegaly. No bruits or masses. Good bowel sounds. Extremities: no cyanosis, + clubbing, rash, edema Neuro: alert & orientedx3, cranial nerves grossly intact. moves all 4 extremities w/o difficulty. Affect pleasant  Telemetry:  Labs: Basic Metabolic Panel:  Lab [id number] 0618 01/20/12 0630 01/19/12 0638 01/18/12 0849  01/17/12 1315  NA 138 134* 135 137 137  K 5.3* 4.9 4.7 4.9 4.4  CL 109 104 106 107 104  CO2 16* 17* 17* 19 18*  GLUCOSE 95 90 85 112* 99  BUN 60* 59* 55* 51* 55*  CREATININE 1.91* 1.98* 1.92* 1.81* 2.10*  CALCIUM 8.6 8.5 8.6 -- --  MG -- -- -- -- --  PHOS -- -- -- -- --    Liver Function Tests:  Lab 01/21/12 0618 01/20/12 0630 01/19/12 0638 01/18/12 0849 01/17/12 1010  AST 58* 55* 62* 61* 77*  ALT 17 17 22 19 28   ALKPHOS 65 66 70 67 80  BILITOT 2.3* 2.3* 2.6* 3.0* 5.0*  PROT 6.5 6.4 7.0 6.4 6.9  ALBUMIN 3.2* 3.2* 3.6 3.3* 3.6   No results found for this basename: LIPASE:5,AMYLASE:5 in the last 168 hours No results found for this basename: AMMONIA:3 in the last 168 hours  CBC:  Lab 01/21/12 0618 01/20/12 0630 01/19/12 0638 01/18/12 0849 01/17/12 1315  WBC 9.7 8.9 10.5 10.6* 10.9*  NEUTROABS -- -- -- -- 6.0  HGB 7.3* 6.7* 7.9* 7.3* 6.3*  HCT 20.1* 18.4* 21.6* 19.6* 16.4*  MCV 84.1 83.6 84.4 84.5 86.3  PLT 131* 122* PLATELET CLUMPS NOTED ON SMEAR, COUNT APPEARS ADEQUATE 138* 170    Cardiac Enzymes: No results found for this basename: CKTOTAL:5,CKMB:5,CKMBINDEX:5,TROPONINI:5 in the last 168 hours  BNP: BNP (last 3 results)  Basename 01/19/12 0600 01/17/12 1010 10/17/11  [place] 993.1* 1782.0* 7276.0*       Imaging:  No results found.   Medications:     Scheduled Medications:    . allopurinol  100 mg Oral Daily  . aspirin EC  81 mg Oral Daily  . carvedilol  18.75 mg Oral BID WC  . cefTRIAXone (ROCEPHIN)  IV  1 g Intravenous Q24H  . folic acid  2 mg Oral Daily  . lisinopril  5 mg Oral Daily  . pantoprazole  40 mg Oral Q0600  . sodium chloride  3 mL Intravenous Q12H     Infusions:     PRN Medications:  acetaminophen, acetaminophen, alum & mag hydroxide-simeth, HYDROcodone-acetaminophen, HYDROmorphone (DILAUDID) injection, ondansetron (ZOFRAN) IV, ondansetron   Assessment:   1. Sickle Cell Crisis, resolving 2. Chronic systolic heart  failure 3. NICM, EF 45-50% 4. CKD, Stage IV 5. Hyperkalemia  Plan/Discussion:    Volume status well controlled and weight stable.  Will continue prn lasix for weight 152 pounds or more.  K trending up, will hold lisinopril for now.    Sickle cell management per Dr. Marlou Sa.     Length of Stay: [place], [facility] 01/21/2012, 9:39 AM  Patient seen and examined with Patrick Haven, PA-C. We discussed all aspects of the encounter. I agree with the assessment and plan as stated above.   Doing well from a HF standpoint. Volume status looks good. Would restart ACE-I as soon as we can. Hgb still on low end of his range. Further management per Dr. Marlou Sa.   Patrick [name] 6:17 PM

## 2012-01-22 ENCOUNTER — Inpatient Hospital Stay (HOSPITAL_COMMUNITY): Payer: Medicaid Other

## 2012-01-22 DIAGNOSIS — I5022 Chronic systolic (congestive) heart failure: Secondary | ICD-10-CM

## 2012-01-22 LAB — URINALYSIS, ROUTINE W REFLEX MICROSCOPIC
Bilirubin Urine: NEGATIVE
Glucose, UA: NEGATIVE mg/dL
Ketones, ur: NEGATIVE mg/dL
Nitrite: NEGATIVE
Protein, ur: 100 mg/dL — AB
Specific Gravity, Urine: 1.012 (ref 1.005–1.030)
Urobilinogen, UA: 0.2 mg/dL (ref 0.0–1.0)
pH: 5.5 (ref 5.0–8.0)

## 2012-01-22 LAB — URINE MICROSCOPIC-ADD ON

## 2012-01-22 LAB — CBC
HCT: 21.4 % — ABNORMAL LOW (ref 39.0–52.0)
Hemoglobin: 7.7 g/dL — ABNORMAL LOW (ref 13.0–17.0)
MCH: 30.3 pg (ref 26.0–34.0)
MCHC: 36 g/dL (ref 30.0–36.0)
MCV: 84.3 fL (ref 78.0–100.0)
Platelets: 129 10*3/uL — ABNORMAL LOW (ref 150–400)
RBC: 2.54 MIL/uL — ABNORMAL LOW (ref 4.22–5.81)
RDW: 20.1 % — ABNORMAL HIGH (ref 11.5–15.5)
WBC: 8.5 10*3/uL (ref 4.0–10.5)

## 2012-01-22 LAB — COMPREHENSIVE METABOLIC PANEL
ALT: 16 U/L (ref 0–53)
AST: 64 U/L — ABNORMAL HIGH (ref 0–37)
Albumin: 3.3 g/dL — ABNORMAL LOW (ref 3.5–5.2)
Alkaline Phosphatase: 67 U/L (ref 39–117)
BUN: 59 mg/dL — ABNORMAL HIGH (ref 6–23)
CO2: 19 mEq/L (ref 19–32)
Calcium: 9 mg/dL (ref 8.4–10.5)
Chloride: 108 mEq/L (ref 96–112)
Creatinine, Ser: 1.87 mg/dL — ABNORMAL HIGH (ref 0.50–1.35)
GFR calc Af Amer: 46 mL/min — ABNORMAL LOW (ref >90)
GFR calc non Af Amer: 39 mL/min — ABNORMAL LOW (ref >90)
Glucose, Bld: 96 mg/dL (ref 70–99)
Potassium: 4.8 mEq/L (ref 3.5–5.1)
Sodium: 136 mEq/L (ref 135–145)
Total Bilirubin: 2.4 mg/dL — ABNORMAL HIGH (ref 0.3–1.2)
Total Protein: 6.8 g/dL (ref 6.0–8.3)

## 2012-01-22 LAB — GLUCOSE, CAPILLARY: Glucose-Capillary: 112 mg/dL — ABNORMAL HIGH (ref 70–99)

## 2012-01-22 MED ORDER — ONDANSETRON HCL 4 MG PO TABS: 4.0000 mg | ORAL_TABLET | Freq: Four times a day (QID) | ORAL | Status: AC | PRN

## 2012-01-22 MED ORDER — PANTOPRAZOLE SODIUM 40 MG PO TBEC: 40.0000 mg | DELAYED_RELEASE_TABLET | Freq: Every day | ORAL | Status: DC

## 2012-01-22 MED ORDER — HYDROCODONE-ACETAMINOPHEN 5-325 MG PO TABS: 1.0000 | ORAL_TABLET | ORAL | Status: AC | PRN

## 2012-01-22 NOTE — Progress Notes (Addendum)
Advanced Heart Failure Rounding Note   Subjective:    Patrick [name] is a 56 y.o. African American male with past medical history significant for sickle cell disease with anemia, NICM, EF Q000111Q and systolic heart failure diagnosed 05/2011, no CAD by cath 05/2011, CKD stage III, and tobacco abuse.   Admitted 8/1 for sickle cell crisis. Received 3 PRBC 8/1  Hgb trending up. Weight remains stable 147. Reports he feels much better and may get d/cd today.    Denies CP/abdominal pain/orthopnea   Objective:   Weight Range: 145-148  Vital Signs:   Temp:  [97.6 F (36.4 C)-98.2 F (36.8 C)] 97.6 F (36.4 C) (08/06 0457) Pulse Rate:  [60-67] 60  (08/06 0636) Resp:  [16-19] 16  (08/06 0636) BP: (98-119)/(57-74) 119/67 mmHg (08/06 0636) SpO2:  [93 %-96 %] 96 % (08/06 0457) Weight:  [66.316 kg (146 lb 3.2 oz)] 66.316 kg (146 lb 3.2 oz) (08/06 0457) Last BM Date: 01/20/12  Weight change: Filed Weights   01/20/12 0519 01/21/12 0427 01/22/12 0457  Weight: 66.906 kg (147 lb 8 oz) 66.906 kg (147 lb 8 oz) 66.316 kg (146 lb 3.2 oz)    Intake/Output:   Intake/Output Summary (Last 24 hours) at 01/22/12 0842 Last data filed at 01/22/12 0810  Gross per 24 hour  Intake    708 ml  Output   1525 ml  Net   -817 ml     Physical Exam: General:  Well appearing. No resp difficulty HEENT: normal. Scleral icterus resolved Neck: supple. JVP 7-8 . Carotids 2+ bilat; no bruits. No lymphadenopathy or thryomegaly appreciated. Cor: PMI nondisplaced. Regular rate & rhythm. No rubs, gallops. 2/6 TR Lungs: clear Abdomen: soft, nontender, nondistended. No hepatosplenomegaly. No bruits or masses. Good bowel sounds. Extremities: no cyanosis, + clubbing, rash, or edema Neuro: alert & orientedx3, cranial nerves grossly intact. moves all 4 extremities w/o difficulty. Affect pleasant  Telemetry:  Labs: Basic Metabolic Panel:  Lab [id number] 0620 01/21/12 0618 01/20/12 0630 01/19/12 0638 01/18/12 0849  NA 136  138 134* 135 137  K 4.8 5.3* 4.9 4.7 4.9  CL 108 109 104 106 107  CO2 19 16* 17* 17* 19  GLUCOSE 96 95 90 85 112*  BUN 59* 60* 59* 55* 51*  CREATININE 1.87* 1.91* 1.98* 1.92* 1.81*  CALCIUM 9.0 8.6 8.5 -- --  MG -- -- -- -- --  PHOS -- -- -- -- --    Liver Function Tests:  Lab 01/22/12 0620 01/21/12 0618 01/20/12 0630 01/19/12 0638 01/18/12 0849  AST 64* 58* 55* 62* 61*  ALT 16 17 17 22 19   ALKPHOS 67 65 66 70 67  BILITOT 2.4* 2.3* 2.3* 2.6* 3.0*  PROT 6.8 6.5 6.4 7.0 6.4  ALBUMIN 3.3* 3.2* 3.2* 3.6 3.3*   No results found for this basename: LIPASE:5,AMYLASE:5 in the last 168 hours No results found for this basename: AMMONIA:3 in the last 168 hours  CBC:  Lab 01/22/12 0620 01/21/12 0618 01/20/12 0630 01/19/12 0638 01/18/12 0849 01/17/12 1315  WBC PENDING 9.7 8.9 10.5 10.6* --  NEUTROABS -- -- -- -- -- 6.0  HGB 7.7* 7.3* 6.7* 7.9* 7.3* --  HCT 21.4* 20.1* 18.4* 21.6* 19.6* --  MCV 84.3 84.1 83.6 84.4 84.5 --  PLT PENDING 131* 122* PLATELET CLUMPS NOTED ON SMEAR, COUNT APPEARS ADEQUATE 138* --    Cardiac Enzymes: No results found for this basename: CKTOTAL:5,CKMB:5,CKMBINDEX:5,TROPONINI:5 in the last 168 hours  BNP: BNP (last 3 results)  Basename 01/19/12 0600  01/17/12 1010 10/17/11 1149  PROBNP 993.1* 1782.0* 7276.0*       Imaging: Dg Pelvis 1-2 Views  01/22/2012  *RADIOLOGY REPORT*  Clinical Data: Sickle cell.  Suprapubic pain.  PELVIS - 1-2 VIEW  Comparison: None.  Findings: No acute fracture.  No dislocation.  IMPRESSION: No acute bony pathology.  Original Report Authenticated By: Patrick [name], M.D.     Medications:     Scheduled Medications:    . allopurinol  100 mg Oral Daily  . aspirin EC  81 mg Oral Daily  . carvedilol  18.75 mg Oral BID WC  . cefTRIAXone (ROCEPHIN)  IV  1 g Intravenous Q24H  . folic acid  2 mg Oral Daily  . pantoprazole  40 mg Oral Q0600  . sodium chloride  3 mL Intravenous Q12H  . DISCONTD: lisinopril  5 mg Oral Daily     Infusions:    PRN Medications: acetaminophen, acetaminophen, alum & mag hydroxide-simeth, HYDROcodone-acetaminophen, HYDROmorphone (DILAUDID) injection, ondansetron (ZOFRAN) IV, ondansetron   Assessment:   1. Sickle Cell Crisis, resolving 2. Chronic systolic heart failure 3. NICM, EF 45-50% 4. CKD, Stage IV 5. Hyperkalemia  Plan/Discussion:    Volume status and weight remain stable. Will continue PRN lasix for weight greater than 152.  Can restart lisinopril - follow K+ closely given risk of recurrent hemolysis.   Sickle cell management per Dr. Marlou [name] and patient reports he may go home today.   Length of Stay: 5  Patrick Brunt, NP 01/22/2012, 8:42 AM  Patient seen and examined with Patrick Bame, NP. We discussed all aspects of the encounter. I agree with the assessment and plan as stated above.   Doing well from a HF standpoint. Will restart lisinopril. Management of SS disease per Dr. Marlou [name].  Patrick Parmar,MD 3:20 PM

## 2012-01-31 NOTE — Discharge Planning (Signed)
Physician Discharge Summary  Patient ID: Patrick [name] MRN: [id number] DOB/AGE: 1956/05/11 56 y.o.  Admit date: 01/17/2012 Discharge date: 01/22/2012 Discharge Diagnoses:   Sickle cell crisis resolved Chronic kidney disease, stage III Tobacco abuse Abdominal pain Anemia of chronic disease. Chronic congestive heart failure  Discharged Condition: improved  Operations/Procedues: transfusion of packed RBCs   Hospital Course: see H&P for details. Patient was admitted for further treatment of diffuse arthralgias with transient epigastric pain. He was noted to be more anemic than before. His hemoglobin was 6.7. Patient has a history of congestive heart failure as well. He had just been seen by his cardiologist that point. The patient was initially evaluated emergency room. His stools were heme negative. He was also admitted to the floor by the hospitalist. Patient was experiencing some pain that was adjusted for sickle cell crisis. He was subsequently transferred to the sickle cell service the following day. He was transfused one unit of packed RBCs. He thereafter was maintained on IV fluids. He was given IV Dilaudid for pain control. This crisis subsequent resolved over several days.  Notably his urine showed some leukocytosis. He was placed on IV antibiotics empirically. Cultures have her eventually returned to be negative. By 01/22/2012 patient was feeling considerably better. He felt that he could manage his pain at home. His renal functions were stable. At that time his discharge his hemoglobin 7.7 and increasing. His creatinine was 1.87 which was down from 2.12.    Disposition: 01-Home or Self Care  Discharge Orders    Future Appointments: Provider: Department: Dept Phone: Center:   02/11/2012 9:15 AM [facility] [phone] None   02/11/2012 9:30 AM [facility] [phone] None     Future Orders Please Complete By  Expires   Diet - low sodium heart healthy      Increase activity slowly      No wound care      Call MD for:  severe uncontrolled pain      Discharge instructions      Comments:   Follow up with primary care physician in two weeks.     Medication List  As of 01/31/2012  1:20 AM   TAKE these medications         allopurinol 100 MG tablet   Commonly known as: ZYLOPRIM   Take 1 tablet (100 mg total) by mouth daily.      aspirin 81 MG EC tablet   Take 1 tablet (81 mg total) by mouth daily.      carvedilol 12.5 MG tablet   Commonly known as: COREG   Take 18.75 mg by mouth 2 (two) times daily with a meal.      folic acid 1 MG tablet   Commonly known as: FOLVITE   Take 2 tablets (2 mg total) by mouth daily.      furosemide 40 MG tablet   Commonly known as: LASIX   Take 1 tablet (40 mg total) by mouth as needed.      HYDROcodone-acetaminophen 5-325 MG per tablet   Commonly known as: NORCO/VICODIN   Take 1-2 tablets by mouth every 4 (four) hours as needed.      lisinopril 10 MG tablet   Commonly known as: PRINIVIL,ZESTRIL   Take 0.5 tablets (5 mg total) by mouth daily.      pantoprazole 40 MG tablet   Commonly known as: PROTONIX   Take 1 tablet (40 mg total) by mouth daily  at 6 (six) AM.          time spent with discharge planning greater than 35 minutes.   SignedMarlou Sa, Maiah Sinning 01/31/2012, 1:20 AM

## 2012-02-07 NOTE — Discharge Summary (Signed)
Physician Discharge Summary  Patient ID: Patrick [name] MRN: [id number] DOB/AGE: December 20, 1955 56 y.o.  Admit date: 01/17/2012 Discharge date: 01/22/2012 Discharge Diagnoses:   Sickle cell crisis resolved Chronic kidney disease, stage III Tobacco abuse Abdominal pain Anemia of chronic disease. Chronic congestive heart failure  Discharged Condition: improved  Operations/Procedues: transfusion of packed RBCs   Hospital Course: see H&P for details. Patient was admitted for further treatment of diffuse arthralgias with transient epigastric pain. He was noted to be more anemic than before. His hemoglobin was 6.7. Patient has a history of congestive heart failure as well. He had just been seen by his cardiologist that point. The patient was initially evaluated emergency room. His stools were heme negative. He was also admitted to the floor by the hospitalist. Patient was experiencing some pain that was adjusted for sickle cell crisis. He was subsequently transferred to the sickle cell service the following day. He was transfused one unit of packed RBCs. He thereafter was maintained on IV fluids. He was given IV Dilaudid for pain control. This crisis subsequent resolved over several days.  Notably his urine showed some leukocytosis. He was placed on IV antibiotics empirically. Cultures have her eventually returned to be negative. By 01/22/2012 patient was feeling considerably better. He felt that he could manage his pain at home. His renal functions were stable. At that time his discharge his hemoglobin 7.7 and increasing. His creatinine was 1.87 which was down from 2.12.    Disposition: 01-Home or Self Care  Discharge Orders    Future Appointments: Provider: Department: Dept Phone: Center:   02/11/2012 9:15 AM [facility] [phone] None   02/11/2012 9:30 AM [facility] [phone] None     Future Orders Please Complete By  Expires   Diet - low sodium heart healthy      Increase activity slowly      No wound care      Call MD for:  severe uncontrolled pain      Discharge instructions      Comments:   Follow up with primary care physician in two weeks.     Medication List  As of 02/07/2012  3:02 PM   TAKE these medications         allopurinol 100 MG tablet   Commonly known as: ZYLOPRIM   Take 1 tablet (100 mg total) by mouth daily.      aspirin 81 MG EC tablet   Take 1 tablet (81 mg total) by mouth daily.      carvedilol 12.5 MG tablet   Commonly known as: COREG   Take 18.75 mg by mouth 2 (two) times daily with a meal.      folic acid 1 MG tablet   Commonly known as: FOLVITE   Take 2 tablets (2 mg total) by mouth daily.      furosemide 40 MG tablet   Commonly known as: LASIX   Take 1 tablet (40 mg total) by mouth as needed.      lisinopril 10 MG tablet   Commonly known as: PRINIVIL,ZESTRIL   Take 0.5 tablets (5 mg total) by mouth daily.      pantoprazole 40 MG tablet   Commonly known as: PROTONIX   Take 1 tablet (40 mg total) by mouth daily at 6 (six) AM.          time spent with discharge planning greater than 35 minutes.   SignedMarlou Sa, Alexine Pilant 02/07/2012, 3:02  PM   

## 2012-02-11 ENCOUNTER — Ambulatory Visit (HOSPITAL_COMMUNITY)
Admit: 2012-02-11 | Discharge: 2012-02-11 | Disposition: A | Discharge status: Home / Self Care | Payer: Medicaid Other | Attending: Internal Medicine | Admitting: Internal Medicine

## 2012-02-11 ENCOUNTER — Encounter (HOSPITAL_COMMUNITY): Payer: Self-pay

## 2012-02-11 ENCOUNTER — Encounter (HOSPITAL_COMMUNITY): Payer: PRIVATE HEALTH INSURANCE

## 2012-02-11 VITALS — BP 93/54 | HR 68 | Resp 18 | Ht 70.0 in | Wt 152.0 lb

## 2012-02-11 DIAGNOSIS — D5700 Hb-SS disease with crisis, unspecified: Secondary | ICD-10-CM

## 2012-02-11 DIAGNOSIS — I5022 Chronic systolic (congestive) heart failure: Secondary | ICD-10-CM

## 2012-02-11 DIAGNOSIS — M109 Gout, unspecified: Secondary | ICD-10-CM | POA: Insufficient documentation

## 2012-02-11 MED ORDER — PREDNISONE 20 MG PO TABS: 40.0000 mg | ORAL_TABLET | Freq: Every day | ORAL | Status: AC

## 2012-02-11 MED ORDER — ALLOPURINOL 100 MG PO TABS: 100.0000 mg | ORAL_TABLET | Freq: Every day | ORAL | Status: DC

## 2012-02-11 NOTE — Assessment & Plan Note (Signed)
Gout of right hand.  Will prescribe prednisone for 3 days and restart allopurinol once gout flare has resolved.

## 2012-02-11 NOTE — Assessment & Plan Note (Signed)
NYHA II.  Volume looks good today.  Will continue sliding scale lasix for weight 152 pounds or greater.  Will not titrate meds today as BP is soft.  Will follow up with [facility] today.

## 2012-02-11 NOTE — Patient Instructions (Addendum)
Take prednisone for 3 days then restart allopurinol.    Follow up 1 month.   Do the following things EVERYDAY: 1) Weigh yourself in the morning before breakfast. Write it down and keep it in a log. 2) Take your medicines as prescribed 3) Eat low salt foods-Limit salt (sodium) to 2000 mg per day.  4) Stay as active as you can everyday 5) Limit all fluids for the day to less than 2 liters

## 2012-02-11 NOTE — Assessment & Plan Note (Signed)
Resolved.  Follow up with Dr. Marlou Sa.

## 2012-02-11 NOTE — Progress Notes (Signed)
Weight Range    145-148 pounds  Baseline proBNP   993.1 on 01/19/12  PCP: Dr Criss Rosales  [facility]   HPI: Patrick [name] is a 56 y.o. African American male with past medical history significant for sickle cell disease with anemia, NICM, EF 99991111 and systolic heart failure diagnosed 05/2011, no CAD by cath 05/2011, CKD stage III, and tobacco abuse.   Admitted in 12/12 with new onset HF. Diuresed and improved. 05/2011: EF 30-35% range (gloabl HK). RV normal function. Mod LAE. RHC after diuresis.  RA mean 2  RV 24/3  PA 22/6  PCWP mean 8  LV 96/7  AO 100/64  Oxygen saturations:  PA 78%  AO 99%  Cardiac Output (Fick) 10.4  Cardiac Index (Fick) 5.7   Admitted to [facility] on 5/1 with recurrent HF. Diuresed approximately 20 pounds. D/c weight 138 pounds. An echo was repeated on 10/17/11 showed LVEF 30-35%, diffuse hypokinesis, diastolic dysfunction, moderately dilated LA, mildly dilated RA, RV normal function and PA systolic pressure Q000111Q mmHg.   11/13/11 Potassium 5.2 ProBNP 3363 Creatinine 1.20 11/28/11 Potassium 4.2  Creatinine 1.3 01/08/12 Potasssium 5.0 Creatinine 1.9  01/17/12 ECHO 45-50%   [facility] crisis requiring packed RBCs transfusion, IV fluids and pain control.  Returns for follow up today.  Digoxin stopped last visit due to NYHA II symptoms and improvement in EF 45-50%.  He feels well today.  He ran out of his allopurinol and feels he has been having gout like symptoms in right hand.  He denies orthopnea/PND.  No dyspnea.  No edema.  He has not required lasix (to take for weight 152 pounds or greater) as weight has remained around 149 pounds.          ROS: All systems negative except as listed in HPI, PMH and Problem List.  Past Medical History  Diagnosis Date  . Shortness of breath   . Anemia     sickle cell anemia  . Headache   . Swelling of extremity, right   . Swelling abdomen   . Swelling of extremity, left   . Sickle cell anemia   . CHF  (congestive heart failure)   . Blood dyscrasia     SICKLE CELL   . Blood transfusion     " NO REACTION TO TRANSFUSION"    Current Outpatient Prescriptions  Medication Sig Dispense Refill  . allopurinol (ZYLOPRIM) 100 MG tablet Take 1 tablet (100 mg total) by mouth daily.  30 tablet  0  . aspirin EC 81 MG EC tablet Take 1 tablet (81 mg total) by mouth daily.  30 tablet  0  . carvedilol (COREG) 12.5 MG tablet Take 18.75 mg by mouth 2 (two) times daily with a meal.      . folic acid (FOLVITE) 1 MG tablet Take 2 tablets (2 mg total) by mouth daily.  60 tablet  0  . furosemide (LASIX) 40 MG tablet Take 1 tablet (40 mg total) by mouth as needed.  30 tablet  3  . lisinopril (PRINIVIL,ZESTRIL) 10 MG tablet Take 0.5 tablets (5 mg total) by mouth daily.  30 tablet  0  . pantoprazole (PROTONIX) 40 MG tablet Take 1 tablet (40 mg total) by mouth daily at 6 (six) AM.  30 tablet  0     PHYSICAL EXAM: Filed Vitals:   02/11/12 0915  BP: 93/54  Pulse: 68  Resp: 18  Height: 5\' 10"  (1.778 m)  Weight: 152 lb (  68.947 kg)  SpO2: 98%    General:  Well appearing. No resp difficulty HEENT: normal except for mild scleral icterus Neck: supple. JVP 5-6. Carotids 2+ bilaterally; no bruits. No lymphadenopathy or thryomegaly appreciated. Cor: PMI normal Regular rate & rhythm. No rubs, gallops or murmurs. Lungs: clear  Abdomen: soft, nontender, nondistended. No hepatosplenomegaly. No bruits or masses. Good bowel sounds. Extremities: no cyanosis, rash, edema + clubbing.  TTP on right thumb  Neuro: alert & orientedx3, cranial nerves grossly intact. Moves all 4 extremities w/o difficulty. Affect pleasant.   ASSESSMENT & PLAN:

## 2012-02-26 ENCOUNTER — Ambulatory Visit (HOSPITAL_COMMUNITY)
Admission: RE | Admit: 2012-02-26 | Discharge: 2012-02-26 | Disposition: A | Discharge status: Home / Self Care | Payer: Medicaid Other | Source: Ambulatory Visit | Attending: Internal Medicine | Admitting: Internal Medicine

## 2012-02-26 ENCOUNTER — Ambulatory Visit (HOSPITAL_COMMUNITY): Payer: Self-pay

## 2012-02-26 ENCOUNTER — Encounter (HOSPITAL_COMMUNITY): Payer: Self-pay

## 2012-02-26 VITALS — BP 116/70 | HR 73 | Ht 70.0 in | Wt 153.8 lb

## 2012-02-26 DIAGNOSIS — I5022 Chronic systolic (congestive) heart failure: Secondary | ICD-10-CM

## 2012-02-26 DIAGNOSIS — I10 Essential (primary) hypertension: Secondary | ICD-10-CM

## 2012-02-26 MED ORDER — CARVEDILOL 25 MG PO TABS: 25.0000 mg | ORAL_TABLET | Freq: Two times a day (BID) | ORAL | Status: DC

## 2012-02-26 NOTE — Patient Instructions (Addendum)
Increase carvedilol 2 tabs (25 mg) twice daily.  Have sent in refill for 25 mg (1 tab) twice daily when you need refill.  Follow up 3 weeks

## 2012-02-26 NOTE — Progress Notes (Signed)
Weight Range    145-148 pounds  Baseline proBNP   993.1 on 01/19/12  PCP: Dr Criss Rosales  [facility]   HPI: Mr. Savino is a 56 y.o. African American male with past medical history significant for sickle cell disease with anemia, NICM, EF 99991111 and systolic heart failure diagnosed 05/2011, no CAD by cath 05/2011, CKD stage III, and tobacco abuse.   Admitted in 12/12 with new onset HF. Diuresed and improved. 05/2011: EF 30-35% range (gloabl HK). RV normal function. Mod LAE. RHC after diuresis.  RA mean 2  RV 24/3  PA 22/6  PCWP mean 8  LV 96/7  AO 100/64  Oxygen saturations:  PA 78%  AO 99%  Cardiac Output (Fick) 10.4  Cardiac Index (Fick) 5.7   Admitted to [facility] on 5/1 with recurrent HF. Diuresed approximately 20 pounds. D/c weight 138 pounds. An echo was repeated on 10/17/11 showed LVEF 30-35%, diffuse hypokinesis, diastolic dysfunction, moderately dilated LA, mildly dilated RA, RV normal function and PA systolic pressure Q000111Q mmHg.   11/13/11 Potassium 5.2 ProBNP 3363 Creatinine 1.20 11/28/11 Potassium 4.2  Creatinine 1.3 01/08/12 Potasssium 5.0 Creatinine 1.9 01/22/12 K 4.8, Cr 1.87, BUN 59  01/17/12 ECHO 45-50%   [facility] for sickle cell crisis requiring packed RBCs transfusion, IV fluids and pain control.    Here for follow up today.  He feels really good today.  Gout has cleared.  No edema.  No lasix needed.  His scale ran out of batteries 2 days ago and he needs to pick up more batteries.  No chest pain.  No orthopnea/PND.       ROS: All systems negative except as listed in HPI, PMH and Problem List.  Past Medical History  Diagnosis Date  . Shortness of breath   . Anemia     sickle cell anemia  . Headache   . Swelling of extremity, right   . Swelling abdomen   . Swelling of extremity, left   . Sickle cell anemia   . CHF (congestive heart failure)   . Blood dyscrasia     SICKLE CELL   . Blood transfusion     " NO REACTION TO TRANSFUSION"    Current  Outpatient Prescriptions  Medication Sig Dispense Refill  . allopurinol (ZYLOPRIM) 100 MG tablet Take 1 tablet (100 mg total) by mouth daily.  30 tablet  0  . aspirin EC 81 MG EC tablet Take 1 tablet (81 mg total) by mouth daily.  30 tablet  0  . carvedilol (COREG) 12.5 MG tablet Take 18.75 mg by mouth 2 (two) times daily with a meal.      . folic acid (FOLVITE) 1 MG tablet Take 2 tablets (2 mg total) by mouth daily.  60 tablet  0  . furosemide (LASIX) 40 MG tablet Take 1 tablet (40 mg total) by mouth as needed.  30 tablet  3  . pantoprazole (PROTONIX) 40 MG tablet Take 1 tablet (40 mg total) by mouth daily at 6 (six) AM.  30 tablet  0  . lisinopril (PRINIVIL,ZESTRIL) 10 MG tablet Take 0.5 tablets (5 mg total) by mouth daily.  30 tablet  0     PHYSICAL EXAM: Filed Vitals:   02/26/12 1410  BP: 116/70  Pulse: 73  Height: 5\' 10"  (1.778 m)  Weight: 153 lb 12.8 oz (69.763 kg)  SpO2: 94%    General:  Well appearing. No resp difficulty HEENT: normal except for mild scleral icterus Neck:  supple. JVP 5-6. Carotids 2+ bilaterally; no bruits. No lymphadenopathy or thryomegaly appreciated. Cor: PMI normal Regular rate & rhythm. No rubs, gallops or murmurs. Lungs: clear  Abdomen: soft, nontender, nondistended. No hepatosplenomegaly. No bruits or masses. Good bowel sounds. Extremities: no cyanosis, rash, edema + clubbing. Neuro: alert & orientedx3, cranial nerves grossly intact. Moves all 4 extremities w/o difficulty. Affect pleasant.   ASSESSMENT & PLAN:

## 2012-02-26 NOTE — Assessment & Plan Note (Signed)
As above, increase carvedilol.

## 2012-02-26 NOTE — Assessment & Plan Note (Signed)
NYHA II.  Volume status looks good.  He continues to do well on sliding scale lasix.  Will titrate carvedilol 25 mg BID.  Will have labs today with Guide-It trial.

## 2012-03-14 ENCOUNTER — Encounter (HOSPITAL_COMMUNITY): Payer: Self-pay

## 2012-03-18 ENCOUNTER — Encounter (HOSPITAL_COMMUNITY): Payer: Self-pay

## 2012-03-18 ENCOUNTER — Ambulatory Visit (HOSPITAL_COMMUNITY)
Admission: RE | Admit: 2012-03-18 | Discharge: 2012-03-18 | Disposition: A | Discharge status: Home / Self Care | Payer: Medicaid Other | Source: Ambulatory Visit | Attending: Internal Medicine | Admitting: Internal Medicine

## 2012-03-18 VITALS — BP 112/68 | HR 80 | Ht 70.0 in | Wt 155.4 lb

## 2012-03-18 DIAGNOSIS — I5022 Chronic systolic (congestive) heart failure: Secondary | ICD-10-CM | POA: Insufficient documentation

## 2012-03-18 NOTE — Assessment & Plan Note (Signed)
Doing well from a HF perspective. Volume status looks good. Discussed when to take lasix as needed (weight 156 or greater on home scale). Continue carvedilol. Dose of lisinopril limited by hyperkalemia. Will draw blood today for GUIDE-IT trial.

## 2012-03-18 NOTE — Progress Notes (Signed)
Weight Range    145-148 pounds  Baseline proBNP   993.1 on 01/19/12  PCP: Dr Criss Rosales  Guide IT Trial   HPI: Patrick [name] is a 56 y.o. African American male with past medical history significant for sickle cell disease with anemia, NICM, EF 99991111 and systolic heart failure diagnosed 05/2011, no CAD by cath 05/2011, CKD stage III, and tobacco abuse.   Admitted in 12/12 with new onset HF. Diuresed and improved. 05/2011: EF 30-35% range (gloabl HK). RV normal function. Mod LAE. RHC after diuresis.  RA mean 2  RV 24/3  PA 22/6  PCWP mean 8  LV 96/7  AO 100/64  Oxygen saturations:  PA 78%  AO 99%  Cardiac Output (Fick) 10.4  Cardiac Index (Fick) 5.7   Admitted to [facility] on 5/1 with recurrent HF. Diuresed approximately 20 pounds. D/c weight 138 pounds. An echo was repeated on 10/17/11 showed LVEF 30-35%, diffuse hypokinesis, diastolic dysfunction, moderately dilated LA, mildly dilated RA, RV normal function and PA systolic pressure Q000111Q mmHg.   11/13/11 Potassium 5.2 ProBNP 3363 Creatinine 1.20 11/28/11 Potassium 4.2  Creatinine 1.3 01/08/12 Potasssium 5.0 Creatinine 1.9 01/22/12 K 4.8, Cr 1.87, BUN 59  01/17/12 ECHO 45-50%   [facility] for sickle cell crisis requiring packed RBCs transfusion, IV fluids and pain control.    Here for follow up today.  He feels good.  Has not needed for at least 1.5 months.  No edema, orthopnea, PND. Weighing every day. Will take lasix for weight at 156 or 157.  Smoking a few cigarettes. Saw Dr. Marlou Sa in [facility] last month. Dose of lisinopril has been limited by hypokalemia.    ROS: All systems negative except as listed in HPI, PMH and Problem List.  Past Medical History  Diagnosis Date  . Shortness of breath   . Anemia     sickle cell anemia  . Headache   . Swelling of extremity, right   . Swelling abdomen   . Swelling of extremity, left   . Sickle cell anemia   . CHF (congestive heart failure)   . Blood dyscrasia     SICKLE CELL   .  Blood transfusion     " NO REACTION TO TRANSFUSION"    Current Outpatient Prescriptions  Medication Sig Dispense Refill  . allopurinol (ZYLOPRIM) 100 MG tablet Take 1 tablet (100 mg total) by mouth daily.  30 tablet  0  . aspirin EC 81 MG EC tablet Take 1 tablet (81 mg total) by mouth daily.  30 tablet  0  . carvedilol (COREG) 25 MG tablet Take 1 tablet (25 mg total) by mouth 2 (two) times daily with a meal.  60 tablet  6  . folic acid (FOLVITE) 1 MG tablet Take 2 tablets (2 mg total) by mouth daily.  60 tablet  0  . furosemide (LASIX) 40 MG tablet Take 1 tablet (40 mg total) by mouth as needed.  30 tablet  3  . lisinopril (PRINIVIL,ZESTRIL) 10 MG tablet Take 0.5 tablets (5 mg total) by mouth daily.  30 tablet  0  . pantoprazole (PROTONIX) 40 MG tablet Take 1 tablet (40 mg total) by mouth daily at 6 (six) AM.  30 tablet  0     PHYSICAL EXAM: Filed Vitals:   03/18/12 0942  BP: 112/68  Pulse: 80  Height: 5\' 10"  (1.778 m)  Weight: 155 lb 6.4 oz (70.489 kg)  SpO2: 96%    General:  Well appearing. No  resp difficulty HEENT: normal except for mild scleral icterus Neck: supple. JVP 5-6. Carotids 2+ bilaterally; no bruits. No lymphadenopathy or thryomegaly appreciated. Cor: PMI normal Regular rate & rhythm. No rubs, gallops or murmurs. Lungs: clear with long exp phase. No wheezing Abdomen: soft, nontender, nondistended. No hepatosplenomegaly. No bruits or masses. Good bowel sounds. Extremities: no cyanosis, rash, edema + clubbing. Neuro: alert & orientedx3, cranial nerves grossly intact. Moves all 4 extremities w/o difficulty. Affect pleasant.   ASSESSMENT & PLAN:

## 2012-03-20 ENCOUNTER — Telehealth (HOSPITAL_COMMUNITY): Payer: Self-pay | Admitting: *Deleted

## 2012-03-20 MED ORDER — FUROSEMIDE 40 MG PO TABS: 40.0000 mg | ORAL_TABLET | ORAL | Status: DC

## 2012-03-20 NOTE — Telephone Encounter (Signed)
Received labwork on pt, BNP 2478 per Dr Haroldine Laws have pt take Lasix 40 mg every Mon and Fri pt is aware and agreeable, appt scheduled for f/u on 10/21, will recheck labs at that time

## 2012-03-28 NOTE — Telephone Encounter (Signed)
Patient advised of lab results

## 2012-04-07 ENCOUNTER — Encounter (HOSPITAL_COMMUNITY): Payer: Self-pay

## 2012-04-07 ENCOUNTER — Ambulatory Visit (HOSPITAL_COMMUNITY)
Admission: RE | Admit: 2012-04-07 | Discharge: 2012-04-07 | Disposition: A | Discharge status: Home / Self Care | Payer: Medicaid Other | Source: Ambulatory Visit | Attending: Internal Medicine | Admitting: Internal Medicine

## 2012-04-07 ENCOUNTER — Encounter: Payer: Self-pay | Admitting: Cardiology

## 2012-04-07 VITALS — BP 106/78 | HR 82 | Wt 157.0 lb

## 2012-04-07 DIAGNOSIS — I5020 Unspecified systolic (congestive) heart failure: Secondary | ICD-10-CM | POA: Insufficient documentation

## 2012-04-07 DIAGNOSIS — D571 Sickle-cell disease without crisis: Secondary | ICD-10-CM | POA: Insufficient documentation

## 2012-04-07 DIAGNOSIS — Z720 Tobacco use: Secondary | ICD-10-CM

## 2012-04-07 DIAGNOSIS — M109 Gout, unspecified: Secondary | ICD-10-CM

## 2012-04-07 DIAGNOSIS — N183 Chronic kidney disease, stage 3 unspecified: Secondary | ICD-10-CM | POA: Insufficient documentation

## 2012-04-07 DIAGNOSIS — F17200 Nicotine dependence, unspecified, uncomplicated: Secondary | ICD-10-CM

## 2012-04-07 DIAGNOSIS — I509 Heart failure, unspecified: Secondary | ICD-10-CM | POA: Insufficient documentation

## 2012-04-07 DIAGNOSIS — I5022 Chronic systolic (congestive) heart failure: Secondary | ICD-10-CM

## 2012-04-07 NOTE — Patient Instructions (Addendum)
Continue current medications.    Follow up November 20th at 9:30.  [place]

## 2012-04-07 NOTE — Assessment & Plan Note (Signed)
Discussed need for cessation.  Patient is currently not interested.

## 2012-04-07 NOTE — Progress Notes (Signed)
Weight Range    145-148 pounds  Baseline proBNP   993.1 on 01/19/12  PCP: Dr Criss Rosales  Guide IT Trial   HPI: [name] is a 56 y.o. African American male with past medical history significant for sickle cell disease with anemia, NICM, EF 99991111 and systolic heart failure diagnosed 05/2011, no CAD by cath 05/2011, CKD stage III, and tobacco abuse.   Admitted in 12/12 with new onset HF. Diuresed and improved. 05/2011: EF 30-35% range (gloabl HK). RV normal function. Mod LAE. RHC after diuresis.  RA mean 2  RV 24/3  PA 22/6  PCWP mean 8  LV 96/7  AO 100/64  Oxygen saturations:  PA 78%  AO 99%  Cardiac Output (Fick) 10.4  Cardiac Index (Fick) 5.7   Admitted to [facility] on 5/1 with recurrent HF. Diuresed approximately 20 pounds. D/c weight 138 pounds. An echo was repeated on 10/17/11 showed LVEF 30-35%, diffuse hypokinesis, diastolic dysfunction, moderately dilated LA, mildly dilated RA, RV normal function and PA systolic pressure Q000111Q mmHg.   01/17/12 ECHO 45-50%   [facility] for sickle cell crisis requiring packed RBCs transfusion, IV fluids and pain control.    Here for follow up today.  He feels good.  BNP elevated last visit to 2478 therefore lasix prescribed every Mon and Fri.  Weight at home 155.  He denies orthopnea, PND or edema. Smoking 4 cigs/day.  Lisinopril limited and no spiro due to hyperkalemia.     ROS: All systems negative except as listed in HPI, PMH and Problem List.  Past Medical History  Diagnosis Date  . Shortness of breath   . Anemia     sickle cell anemia  . Headache   . Swelling of extremity, right   . Swelling abdomen   . Swelling of extremity, left   . Sickle cell anemia   . CHF (congestive heart failure)   . Blood dyscrasia     SICKLE CELL   . Blood transfusion     " NO REACTION TO TRANSFUSION"    Current Outpatient Prescriptions  Medication Sig Dispense Refill  . allopurinol (ZYLOPRIM) 100 MG tablet Take 1 tablet (100 mg total) by  mouth daily.  30 tablet  0  . aspirin EC 81 MG EC tablet Take 1 tablet (81 mg total) by mouth daily.  30 tablet  0  . carvedilol (COREG) 25 MG tablet Take 1 tablet (25 mg total) by mouth 2 (two) times daily with a meal.  60 tablet  6  . folic acid (FOLVITE) 1 MG tablet Take 2 tablets (2 mg total) by mouth daily.  60 tablet  0  . furosemide (LASIX) 40 MG tablet Take 1 tablet (40 mg total) by mouth 2 (two) times a week. Every Monday and Friday  30 tablet  3  . lisinopril (PRINIVIL,ZESTRIL) 10 MG tablet Take 0.5 tablets (5 mg total) by mouth daily.  30 tablet  0  . pantoprazole (PROTONIX) 40 MG tablet Take 1 tablet (40 mg total) by mouth daily at 6 (six) AM.  30 tablet  0     PHYSICAL EXAM: Filed Vitals:   04/07/12 0921  BP: 106/78  Pulse: 82  Weight: 157 lb (71.215 kg)  SpO2: 92%    General:  Well appearing. No resp difficulty HEENT: normal except for mild scleral icterus Neck: supple. JVP 5-6. Carotids 2+ bilaterally; no bruits. No lymphadenopathy or thryomegaly appreciated. Cor: PMI normal Regular rate & rhythm. No rubs, gallops or murmurs.  Lungs: clear with long exp phase. No wheezing, crackles or rhonchi. Abdomen: soft, nontender, nondistended. No hepatosplenomegaly. No bruits or masses. Good bowel sounds. Extremities: no cyanosis, rash, edema + clubbing. Neuro: alert & orientedx3, cranial nerves grossly intact. Moves all 4 extremities w/o difficulty. Affect pleasant.   ASSESSMENT & PLAN:

## 2012-04-07 NOTE — Assessment & Plan Note (Signed)
He is out of allopurinol and not interested in taking it at this time.  Have discussed that if he has a second flare up he would probably benefit from daily allopurinol.  He voiced understanding.

## 2012-04-07 NOTE — Assessment & Plan Note (Signed)
EF recovered.  Volume status stable but proBNP elevated last visit.  NYHA I.  Will continue current regimen.  No further titration of lisinopril due to hyperkalemia.  Will redraw proBNP today with GUIDE-IT trial.  Follow up 1 month.

## 2012-04-21 ENCOUNTER — Telehealth (HOSPITAL_COMMUNITY): Payer: Self-pay | Admitting: *Deleted

## 2012-04-21 MED ORDER — PREDNISONE 20 MG PO TABS: 40.0000 mg | ORAL_TABLET | Freq: Every day | ORAL | Status: DC

## 2012-04-21 MED ORDER — ALLOPURINOL 100 MG PO TABS: 100.0000 mg | ORAL_TABLET | Freq: Every day | ORAL | Status: DC

## 2012-04-21 NOTE — Telephone Encounter (Signed)
PT CALLED STATING HE WAS HAVING A GOUT FLARE UP  IN HIS LEFT WRIST AND HAND, IT IS SWOLLEN AND PAINFUL, HE HAS BEEN OUT OF HIS ALLOPURINOL, PER NICKI BRADLEY, PA CAN GIVE PREDNISONE 40 MG FOR 3 DAYS PT IS AWARE AND AGREEABLE RX SENT IN, ALSO REFILLED PRESCRIPTION FOR ALLOPURINOL 100 MG DAILY, PT AWARE TO NOT START UNTIL FLARE UP IS UNDER CONTROL

## 2012-04-23 ENCOUNTER — Encounter: Payer: Self-pay | Admitting: Internal Medicine

## 2012-05-07 ENCOUNTER — Inpatient Hospital Stay (HOSPITAL_COMMUNITY): Admission: RE | Admit: 2012-05-07 | Payer: Medicaid Other | Source: Ambulatory Visit

## 2012-05-28 ENCOUNTER — Encounter (HOSPITAL_COMMUNITY): Payer: Self-pay

## 2012-05-28 ENCOUNTER — Ambulatory Visit (HOSPITAL_COMMUNITY)
Admission: RE | Admit: 2012-05-28 | Discharge: 2012-05-28 | Disposition: A | Discharge status: Home / Self Care | Payer: Medicaid Other | Source: Ambulatory Visit | Attending: Internal Medicine | Admitting: Internal Medicine

## 2012-05-28 VITALS — BP 138/88 | HR 95 | Wt 149.0 lb

## 2012-05-28 DIAGNOSIS — F17200 Nicotine dependence, unspecified, uncomplicated: Secondary | ICD-10-CM

## 2012-05-28 DIAGNOSIS — I10 Essential (primary) hypertension: Secondary | ICD-10-CM | POA: Insufficient documentation

## 2012-05-28 DIAGNOSIS — I5022 Chronic systolic (congestive) heart failure: Secondary | ICD-10-CM

## 2012-05-28 DIAGNOSIS — Z720 Tobacco use: Secondary | ICD-10-CM

## 2012-05-28 MED ORDER — CARVEDILOL 25 MG PO TABS: 25.0000 mg | ORAL_TABLET | Freq: Two times a day (BID) | ORAL | Status: DC

## 2012-05-28 MED ORDER — LISINOPRIL 10 MG PO TABS: 5.0000 mg | ORAL_TABLET | Freq: Every day | ORAL | Status: DC

## 2012-05-28 MED ORDER — FUROSEMIDE 40 MG PO TABS: 40.0000 mg | ORAL_TABLET | ORAL | Status: DC

## 2012-05-28 NOTE — Assessment & Plan Note (Signed)
He declines smoking cessation at this time.

## 2012-05-28 NOTE — Patient Instructions (Addendum)
Follow up in 1 month  Please call HF Clinic [phone]  Do the following things EVERYDAY: 1) Weigh yourself in the morning before breakfast. Write it down and keep it in a log. 2) Take your medicines as prescribed 3) Eat low salt foods-Limit salt (sodium) to 2000 mg per day.  4) Stay as active as you can everyday 5) Limit all fluids for the day to less than 2 liters

## 2012-05-28 NOTE — Assessment & Plan Note (Signed)
NYHA I. Volume status stable. Reordered lasix, lisinopril, and carvedilol. Instructed him to call pharmacy when he is running out of medications for refills. Follow up in 1 month to reassess.

## 2012-05-28 NOTE — Progress Notes (Signed)
Patient ID: Patrick [name], male   DOB: Feb 19, 1956, 56 y.o.   MRN: [id number]  Weight Range    145-148 pounds  Baseline proBNP   993.1 on 01/19/12  PCP: [name] : Dr Marlou Sa Opthalmologist: [name]  Guide IT Trial   HPI: Patrick [name] is a 56 y.o. African American male with past medical history significant for sickle cell disease with anemia, NICM, EF 99991111 and systolic heart failure diagnosed 05/2011, no CAD by cath 05/2011, CKD stage III, and tobacco abuse.   Admitted in 12/12 with new onset HF. Diuresed and improved. 05/2011: EF 30-35% range (gloabl HK). RV normal function. Mod LAE. RHC after diuresis.  RA mean 2  RV 24/3  PA 22/6  PCWP mean 8  LV 96/7  AO 100/64  Oxygen saturations:  PA 78%  AO 99%  Cardiac Output (Fick) 10.4  Cardiac Index (Fick) 5.7   Admitted to [facility] on 5/1 with recurrent HF. Diuresed approximately 20 pounds. D/c weight 138 pounds. An echo was repeated on 10/17/11 showed LVEF 30-35%, diffuse hypokinesis, diastolic dysfunction, moderately dilated LA, mildly dilated RA, RV normal function and PA systolic pressure Q000111Q mmHg.   01/17/12 ECHO 55%-60%   [facility] crisis requiring packed RBCs transfusion, IV fluids and pain control.    04/07/12 ProBNP 4893.   He returns for follow up. Feels good. Denies SOB/PND/Orthopnea. Having difficulty with his vision. Scheduled to see [name] next week to evaluate vision. Weight at home 152 pounds. Out medications for the few days. Smoking 4 cigarettes per day. He is not on Sprironolactone due to hyperkalemia. Unable to titrate ace inhibitor due to hyperkalemia.     ROS: All systems negative except as listed in HPI, PMH and Problem List.  Past Medical History  Diagnosis Date  . Shortness of breath   . Anemia     sickle cell anemia  . Headache   . Swelling of extremity, right   . Swelling abdomen   . Swelling of extremity, left   . Sickle cell anemia   . CHF (congestive  heart failure)   . Blood dyscrasia     SICKLE CELL   . Blood transfusion     " NO REACTION TO TRANSFUSION"    Current Outpatient Prescriptions  Medication Sig Dispense Refill  . allopurinol (ZYLOPRIM) 100 MG tablet Take 1 tablet (100 mg total) by mouth daily.  30 tablet  6  . aspirin EC 81 MG EC tablet Take 1 tablet (81 mg total) by mouth daily.  30 tablet  0  . carvedilol (COREG) 25 MG tablet Take 1 tablet (25 mg total) by mouth 2 (two) times daily with a meal.  60 tablet  6  . folic acid (FOLVITE) 1 MG tablet Take 2 tablets (2 mg total) by mouth daily.  60 tablet  0  . furosemide (LASIX) 40 MG tablet Take 1 tablet (40 mg total) by mouth 2 (two) times a week. Every Monday and Friday  30 tablet  3  . lisinopril (PRINIVIL,ZESTRIL) 10 MG tablet Take 0.5 tablets (5 mg total) by mouth daily.  30 tablet  0  . pantoprazole (PROTONIX) 40 MG tablet Take 1 tablet (40 mg total) by mouth daily at 6 (six) AM.  30 tablet  0  . predniSONE (DELTASONE) 20 MG tablet Take 2 tablets (40 mg total) by mouth daily. For 3 days  6 tablet  0     PHYSICAL EXAM: Filed  Vitals:   05/28/12 0943  BP: 138/88  Pulse: 95  Weight: 149 lb (67.586 kg)  SpO2: 94%    General:  Well appearing. No resp difficulty HEENT: normal except for mild scleral icterus Neck: supple. JVP 5-6. Carotids 2+ bilaterally; no bruits. No lymphadenopathy or thryomegaly appreciated. Cor: PMI normal Regular rate & rhythm. No rubs, gallops or murmurs. Lungs: clear with long exp phase. No wheezing, crackles or rhonchi. Abdomen: soft, nontender, nondistended. No hepatosplenomegaly. No bruits or masses. Good bowel sounds. Extremities: no cyanosis, rash, edema + clubbing. Neuro: alert & orientedx3, cranial nerves grossly intact. Moves all 4 extremities w/o difficulty. Affect pleasant.   ASSESSMENT & PLAN:

## 2012-05-28 NOTE — Assessment & Plan Note (Signed)
SBP mildly elevated but he has been off medications for the last few days. Ace and beta blocker restarted today.

## 2012-06-30 ENCOUNTER — Encounter (HOSPITAL_COMMUNITY): Payer: Self-pay

## 2012-06-30 ENCOUNTER — Ambulatory Visit (HOSPITAL_COMMUNITY)
Admission: RE | Admit: 2012-06-30 | Discharge: 2012-06-30 | Disposition: A | Discharge status: Home / Self Care | Payer: Medicaid Other | Source: Ambulatory Visit | Attending: Internal Medicine | Admitting: Internal Medicine

## 2012-06-30 VITALS — BP 140/72 | HR 92 | Wt 156.4 lb

## 2012-06-30 DIAGNOSIS — F17200 Nicotine dependence, unspecified, uncomplicated: Secondary | ICD-10-CM | POA: Insufficient documentation

## 2012-06-30 DIAGNOSIS — I5032 Chronic diastolic (congestive) heart failure: Secondary | ICD-10-CM | POA: Insufficient documentation

## 2012-06-30 DIAGNOSIS — Z720 Tobacco use: Secondary | ICD-10-CM

## 2012-06-30 DIAGNOSIS — I10 Essential (primary) hypertension: Secondary | ICD-10-CM | POA: Insufficient documentation

## 2012-06-30 NOTE — Patient Instructions (Addendum)
Follow up 3 months

## 2012-06-30 NOTE — Progress Notes (Signed)
Weight Range    145-148 pounds  Baseline proBNP   993.1 on 01/19/12  PCP: [name] : Dr Marlou Sa Opthalmologist: [name]  Guide IT Trial   HPI: Mr. Kunzer is a 57 y.o. African American male with past medical history significant for sickle cell disease with anemia, NICM, EF 99991111 and systolic heart failure diagnosed 05/2011, no CAD by cath 05/2011, CKD stage III, and tobacco abuse.   Admitted in 12/12 with new onset HF. Diuresed and improved. 05/2011: EF 30-35% range (gloabl HK). RV normal function. Mod LAE. RHC after diuresis.  RA mean 2  RV 24/3  PA 22/6  PCWP mean 8  LV 96/7  AO 100/64  Oxygen saturations:  PA 78%  AO 99%  Cardiac Output (Fick) 10.4  Cardiac Index (Fick) 5.7   Admitted to [facility] on 5/1 with recurrent HF. Diuresed approximately 20 pounds. D/c weight 138 pounds. An echo was repeated on 10/17/11 showed LVEF 30-35%, diffuse hypokinesis, diastolic dysfunction, moderately dilated LA, mildly dilated RA, RV normal function and PA systolic pressure Q000111Q mmHg.   01/17/12 ECHO 55%-60%   [facility] for [facility]le cell crisis requiring packed RBCs transfusion, IV fluids and pain control.    He returns for follow up today.  He feels really good.  He denies SOB/PND/Orthopnea.  He is compliant with his meds.  Weight at home stable 151 pounds.  Only using lasix as needed.   No chest pain.  Following low sodium diet.   He is not on Sprironolactone due to hyperkalemia. Unable to titrate ace inhibitor due to hyperkalemia.    ROS: All systems negative except as listed in HPI, PMH and Problem List.  Past Medical History  Diagnosis Date  . Shortness of breath   . Anemia     sickle cell anemia  . Headache   . Swelling of extremity, right   . Swelling abdomen   . Swelling of extremity, left   . Sickle cell anemia   . CHF (congestive heart failure)   . Blood dyscrasia     SICKLE CELL   . Blood transfusion     " NO REACTION TO TRANSFUSION"    Current  Outpatient Prescriptions  Medication Sig Dispense Refill  . aspirin EC 81 MG EC tablet Take 1 tablet (81 mg total) by mouth daily.  30 tablet  0  . carvedilol (COREG) 25 MG tablet Take 1 tablet (25 mg total) by mouth 2 (two) times daily with a meal.  60 tablet  6  . furosemide (LASIX) 40 MG tablet Take 1 tablet (40 mg total) by mouth 2 (two) times a week. Every Monday and Friday  30 tablet  6  . Vitamin D, Ergocalciferol, (DRISDOL) 50000 UNITS CAPS Take 50,000 Units by mouth.      Marland Kitchen allopurinol (ZYLOPRIM) 100 MG tablet Take 1 tablet (100 mg total) by mouth daily.  30 tablet  6  . folic acid (FOLVITE) 1 MG tablet Take 2 tablets (2 mg total) by mouth daily.  60 tablet  0  . lisinopril (PRINIVIL,ZESTRIL) 10 MG tablet Take 0.5 tablets (5 mg total) by mouth daily.  30 tablet  6  . pantoprazole (PROTONIX) 40 MG tablet Take 1 tablet (40 mg total) by mouth daily at 6 (six) AM.  30 tablet  0  . predniSONE (DELTASONE) 20 MG tablet Take 2 tablets (40 mg total) by mouth daily. For 3 days  6 tablet  0     PHYSICAL  EXAM: Filed Vitals:   06/30/12 0933  BP: 140/72  Pulse: 92  Weight: 156 lb 6.4 oz (70.943 kg)  SpO2: 92%    General:  Well appearing. No resp difficulty HEENT: normal except for mild scleral icterus Neck: supple. JVP flat. Carotids 2+ bilaterally; no bruits. No lymphadenopathy or thryomegaly appreciated. Cor: PMI normal Regular rate & rhythm. No rubs, gallops or murmurs. Lungs: clear with long exp phase. No wheezing, crackles or rhonchi. Abdomen: soft, nontender, nondistended. No hepatosplenomegaly. No bruits or masses. Good bowel sounds. Extremities: no cyanosis, rash, edema, + clubbing. Neuro: alert & orientedx3, cranial nerves grossly intact. Moves all 4 extremities w/o difficulty. Affect pleasant.   ASSESSMENT & PLAN:

## 2012-07-01 NOTE — Assessment & Plan Note (Signed)
Encouraged him to stop smoking, declines at this time.

## 2012-07-01 NOTE — Assessment & Plan Note (Signed)
Controlled, continue current therapy

## 2012-07-01 NOTE — Assessment & Plan Note (Addendum)
EF improved per last echo in 01/2012.  Will continue with current management for BP control.  Continue lasix prn for symptom management.  Encouraged him to continue daily weights.  Follow up 1 month for Guide-It study evaluation.

## 2012-08-04 ENCOUNTER — Ambulatory Visit (HOSPITAL_COMMUNITY): Payer: Medicaid Other | Attending: Internal Medicine

## 2012-10-23 ENCOUNTER — Ambulatory Visit (HOSPITAL_COMMUNITY)
Admission: RE | Admit: 2012-10-23 | Discharge: 2012-10-23 | Disposition: A | Discharge status: Home / Self Care | Payer: Medicaid Other | Source: Ambulatory Visit | Attending: Internal Medicine | Admitting: Internal Medicine

## 2012-10-23 ENCOUNTER — Encounter (HOSPITAL_COMMUNITY): Payer: Self-pay

## 2012-10-23 VITALS — BP 144/80 | HR 105 | Resp 20 | Ht 71.0 in | Wt 168.5 lb

## 2012-10-23 DIAGNOSIS — I5032 Chronic diastolic (congestive) heart failure: Secondary | ICD-10-CM

## 2012-10-23 DIAGNOSIS — I509 Heart failure, unspecified: Secondary | ICD-10-CM | POA: Insufficient documentation

## 2012-10-23 DIAGNOSIS — I10 Essential (primary) hypertension: Secondary | ICD-10-CM | POA: Insufficient documentation

## 2012-10-23 DIAGNOSIS — I5043 Acute on chronic combined systolic (congestive) and diastolic (congestive) heart failure: Secondary | ICD-10-CM

## 2012-10-23 LAB — BASIC METABOLIC PANEL
BUN: 31 mg/dL — ABNORMAL HIGH (ref 6–23)
CO2: 19 mEq/L (ref 19–32)
Calcium: 8.1 mg/dL — ABNORMAL LOW (ref 8.4–10.5)
Chloride: 107 mEq/L (ref 96–112)
Creatinine, Ser: 1.92 mg/dL — ABNORMAL HIGH (ref 0.50–1.35)
GFR calc Af Amer: 44 mL/min — ABNORMAL LOW (ref >90)
GFR calc non Af Amer: 38 mL/min — ABNORMAL LOW (ref >90)
Glucose, Bld: 110 mg/dL — ABNORMAL HIGH (ref 70–99)
Potassium: 4.4 mEq/L (ref 3.5–5.1)
Sodium: 136 mEq/L (ref 135–145)

## 2012-10-23 MED ORDER — FUROSEMIDE 40 MG PO TABS: 40.0000 mg | ORAL_TABLET | Freq: Every day | ORAL | Status: DC

## 2012-10-23 NOTE — Progress Notes (Signed)
Weight Range    145-148 pounds  Baseline proBNP   993.1 on 01/19/12  PCP: [name] : Dr Marlou Sa Opthalmologist: [name]  Guide IT Trial   HPI: Patrick [name] is a 57 y.o. African American male with past medical history significant for sickle cell disease with anemia, NICM, EF 99991111 and systolic heart failure diagnosed 05/2011, no CAD by cath 05/2011, CKD stage III, and tobacco abuse.   Admitted in 12/12 with new onset HF. Diuresed and improved. 05/2011: EF 30-35% range (gloabl HK). RV normal function. Mod LAE. RHC after diuresis.  RA mean 2  RV 24/3  PA 22/6  PCWP mean 8  LV 96/7  AO 100/64  Oxygen saturations:  PA 78%  AO 99%  Cardiac Output (Fick) 10.4  Cardiac Index (Fick) 5.7   Admitted to [facility] on 5/1 with recurrent HF. Diuresed approximately 20 pounds. D/c weight 138 pounds. An echo was repeated on 10/17/11 showed LVEF 30-35%, diffuse hypokinesis, diastolic dysfunction, moderately dilated LA, mildly dilated RA, RV normal function and PA systolic pressure Q000111Q mmHg.   01/17/12 ECHO 55%-60%   [facility] cell crisis requiring packed RBCs transfusion, IV fluids and pain control.    He returns for work in visit today for progressive dyspnea and lower extremity edema.  He has been out of his fluid pill since February but noted increased edema ~3 weeks ago.  Since that time he complains of dyspnea with exertion, abdominal distention and edema.  He denies orthopnea or PND.  No chest pain.  No recent fever/chills.   He is not on Sprironolactone due to hyperkalemia. Unable to titrate ace inhibitor due to hyperkalemia.    ROS: All systems negative except as listed in HPI, PMH and Problem List.  Past Medical History  Diagnosis Date  . Shortness of breath   . Anemia     sickle cell anemia  . Headache   . Swelling of extremity, right   . Swelling abdomen   . Swelling of extremity, left   . Sickle cell anemia   . CHF (congestive heart failure)   .  Blood dyscrasia     SICKLE CELL   . Blood transfusion     " NO REACTION TO TRANSFUSION"    Current Outpatient Prescriptions  Medication Sig Dispense Refill  . allopurinol (ZYLOPRIM) 100 MG tablet Take 1 tablet (100 mg total) by mouth daily.  30 tablet  6  . aspirin EC 81 MG EC tablet Take 1 tablet (81 mg total) by mouth daily.  30 tablet  0  . carvedilol (COREG) 25 MG tablet Take 1 tablet (25 mg total) by mouth 2 (two) times daily with a meal.  60 tablet  6  . lisinopril (PRINIVIL,ZESTRIL) 10 MG tablet Take 0.5 tablets (5 mg total) by mouth daily.  30 tablet  6  . folic acid (FOLVITE) 1 MG tablet Take 2 tablets (2 mg total) by mouth daily.  60 tablet  0  . furosemide (LASIX) 40 MG tablet Take 1 tablet (40 mg total) by mouth 2 (two) times a week. Every Monday and Friday  30 tablet  6  . pantoprazole (PROTONIX) 40 MG tablet Take 1 tablet (40 mg total) by mouth daily at 6 (six) AM.  30 tablet  0  . Vitamin D, Ergocalciferol, (DRISDOL) 50000 UNITS CAPS Take 50,000 Units by mouth.       No current facility-administered medications for this encounter.     PHYSICAL  EXAM: Filed Vitals:   10/23/12 1421  BP: 144/80  Pulse: 105  Resp: 20  Height: 5\' 11"  (1.803 m)  Weight: 168 lb 8 oz (76.431 kg)  SpO2: 90%    General:  Well appearing. No resp difficulty HEENT: normal except for mild scleral icterus Neck: supple. JVP 9-10. Carotids 2+ bilaterally; no bruits. No lymphadenopathy or thryomegaly appreciated. Cor: PMI normal Regular rate & rhythm. No rubs, gallops or murmurs. Lungs: clear with long exp phase and crackles.   Abdomen: soft, nontender, nondistended. No hepatosplenomegaly. No bruits or masses. Good bowel sounds. Extremities: no cyanosis, rash, 2+ edema to knee bilaterally,  + clubbing. Neuro: alert & orientedx3, cranial nerves grossly intact. Moves all 4 extremities w/o difficulty. Affect pleasant.   ASSESSMENT & PLAN:

## 2012-10-23 NOTE — Assessment & Plan Note (Addendum)
Volume status elevated in the setting of missing medications.  Have discussed the importance of medication compliance.  He was taking lasix 40 mg twice weekly but will increase to 40 mg daily and give 2 days of metolazone 2.5 mg.  Will follow up with him in clinic in 4 days.  I have informed him that if the fluid is not improving in the next day he should call to let us know.  The patient and his family agree to this.  BMET today.

## 2012-10-23 NOTE — Patient Instructions (Addendum)
Start back lasix 40 mg daily.    Take metolazone 2.5 mg today and tomorrow.  Labs today  Follow up Tuesday

## 2012-10-28 ENCOUNTER — Ambulatory Visit (HOSPITAL_COMMUNITY)
Admission: RE | Admit: 2012-10-28 | Discharge: 2012-10-28 | Disposition: A | Discharge status: Home / Self Care | Payer: Medicaid Other | Source: Ambulatory Visit | Attending: Internal Medicine | Admitting: Internal Medicine

## 2012-10-28 VITALS — BP 150/80 | HR 100 | Wt 157.0 lb

## 2012-10-28 DIAGNOSIS — I5032 Chronic diastolic (congestive) heart failure: Secondary | ICD-10-CM

## 2012-10-28 DIAGNOSIS — N183 Chronic kidney disease, stage 3 unspecified: Secondary | ICD-10-CM | POA: Insufficient documentation

## 2012-10-28 DIAGNOSIS — Z79899 Other long term (current) drug therapy: Secondary | ICD-10-CM | POA: Insufficient documentation

## 2012-10-28 DIAGNOSIS — D571 Sickle-cell disease without crisis: Secondary | ICD-10-CM | POA: Insufficient documentation

## 2012-10-28 DIAGNOSIS — I509 Heart failure, unspecified: Secondary | ICD-10-CM | POA: Insufficient documentation

## 2012-10-28 DIAGNOSIS — H548 Legal blindness, as defined in USA: Secondary | ICD-10-CM | POA: Insufficient documentation

## 2012-10-28 DIAGNOSIS — F17200 Nicotine dependence, unspecified, uncomplicated: Secondary | ICD-10-CM | POA: Insufficient documentation

## 2012-10-28 MED ORDER — FUROSEMIDE 40 MG PO TABS: 40.0000 mg | ORAL_TABLET | Freq: Every day | ORAL | Status: DC

## 2012-10-28 NOTE — Progress Notes (Signed)
Patient ID: HORRACE LOEWEN, male   DOB: July 28, 1955, 57 y.o.   MRN: [id number]  Weight Range    145-148 pounds  Baseline proBNP   993.1 on 01/19/12  PCP: [name] : Dr Marlou Sa Opthalmologist: [name]  Guide IT Trial   HPI: Mr. Resner is a 57 y.o. African American male with a history of  sickle cell disease with anemia, NICM, EF 99991111 and systolic heart failure diagnosed 05/2011, no CAD by cath 05/2011, CKD stage III, and tobacco abuse.   Admitted in 12/12 with new onset HF. Diuresed and improved. 05/2011: EF 30-35% range (gloabl HK). RV normal function. Mod LAE. RHC after diuresis.  RA mean 2  RV 24/3  PA 22/6  PCWP mean 8  LV 96/7  AO 100/64  Oxygen saturations:  PA 78%  AO 99%  Cardiac Output (Fick) 10.4  Cardiac Index (Fick) 5.7   Admitted to [facility] on 10/17/2011  with recurrent HF. Diuresed approximately 20 pounds. D/C weight 138 pounds. An echo was repeated on 10/17/11 showed LVEF 30-35%, diffuse hypokinesis, diastolic dysfunction, moderately dilated LA, mildly dilated RA, RV normal function and PA systolic pressure Q000111Q mmHg.   01/17/12 ECHO 55%-60%   He returns for follow up with his girl friend. Last week he was evaluated in HF clinic due to dyspnea. Lasix increased to daily  and he was given Metolazone  for 2 days. Mild dyspnea with exertion. Denies PND/Orthopnea. Weight has gone down from 163 to 157 pounds. He has not been weighing at home. Compliant with medications. He has started following low salt diet again but admits to eating lots of pretzels. He is legally blind.   He is not on Sprironolactone due to hyperkalemia. Unable to titrate ace inhibitor due to hyperkalemia.    ROS: All systems negative except as listed in HPI, PMH and Problem List.  Past Medical History  Diagnosis Date  . Shortness of breath   . Anemia     sickle cell anemia  . Headache   . Swelling of extremity, right   . Swelling abdomen   . Swelling of extremity, left   . Sickle cell  anemia   . CHF (congestive heart failure)   . Blood dyscrasia     SICKLE CELL   . Blood transfusion     " NO REACTION TO TRANSFUSION"    Current Outpatient Prescriptions  Medication Sig Dispense Refill  . carvedilol (COREG) 25 MG tablet Take 1 tablet (25 mg total) by mouth 2 (two) times daily with a meal.  60 tablet  6  . furosemide (LASIX) 40 MG tablet Take 1 tablet (40 mg total) by mouth daily. Every Monday and Friday  30 tablet  3  . lisinopril (PRINIVIL,ZESTRIL) 10 MG tablet Take 0.5 tablets (5 mg total) by mouth daily.  30 tablet  6  . Vitamin D, Ergocalciferol, (DRISDOL) 50000 UNITS CAPS Take 50,000 Units by mouth.      Marland Kitchen allopurinol (ZYLOPRIM) 100 MG tablet Take 1 tablet (100 mg total) by mouth daily.  30 tablet  6  . pantoprazole (PROTONIX) 40 MG tablet Take 1 tablet (40 mg total) by mouth daily at 6 (six) AM.  30 tablet  0   No current facility-administered medications for this encounter.     PHYSICAL EXAM: Filed Vitals:   10/28/12 0928  BP: 150/80  Pulse: 100  Weight: 157 lb (71.215 kg)  SpO2: 90%    General:  Well appearing. No resp difficulty HEENT:  normal except for mild scleral icterus Neck: supple. JVP 9-10. Carotids 2+ bilaterally; no bruits. No lymphadenopathy or thryomegaly appreciated. Cor: PMI normal Regular rate & rhythm. No rubs, gallops or murmurs. Lungs: clear    Abdomen: soft, nontender, nondistended. No hepatosplenomegaly. No bruits or masses. Good bowel sounds. Extremities: no cyanosis, rash, 2+ edema to knee bilaterally,  + clubbing. Neuro: alert & orientedx3, cranial nerves grossly intact. Moves all 4 extremities w/o difficulty. Affect pleasant.   ASSESSMENT & PLAN:

## 2012-10-28 NOTE — Assessment & Plan Note (Signed)
Volume status improving but still with about 5 pounds to go. Would like his weight closer to 150 pounds. Instructed to take additional 40 mg of lasix for the next 2 days. He continues to eat high salt food and we discussed low salt food choices. Reinforced daily weights and I have asked him to return next week with weight log. Due to vision loss his girl friend has agreed to help with weight chart and medications.

## 2012-10-28 NOTE — Patient Instructions (Addendum)
Follow up next week.   Take Lasix 40 mg daily  Take an extra lasix for the next 2 days  Do the following things EVERYDAY: 1) Weigh yourself in the morning before breakfast. Write it down and keep it in a log. 2) Take your medicines as prescribed 3) Eat low salt foods-Limit salt (sodium) to 2000 mg per day.  4) Stay as active as you can everyday 5) Limit all fluids for the day to less than 2 liters

## 2012-11-01 ENCOUNTER — Encounter: Payer: Self-pay | Admitting: Internal Medicine

## 2012-11-06 ENCOUNTER — Encounter (HOSPITAL_COMMUNITY): Payer: Self-pay

## 2012-11-06 ENCOUNTER — Ambulatory Visit (HOSPITAL_COMMUNITY)
Admission: RE | Admit: 2012-11-06 | Discharge: 2012-11-06 | Disposition: A | Discharge status: Home / Self Care | Payer: Medicaid Other | Source: Ambulatory Visit | Attending: Internal Medicine | Admitting: Internal Medicine

## 2012-11-06 VITALS — BP 120/66 | HR 81 | Wt 152.8 lb

## 2012-11-06 DIAGNOSIS — I5032 Chronic diastolic (congestive) heart failure: Secondary | ICD-10-CM | POA: Insufficient documentation

## 2012-11-06 DIAGNOSIS — N183 Chronic kidney disease, stage 3 unspecified: Secondary | ICD-10-CM | POA: Insufficient documentation

## 2012-11-06 NOTE — Assessment & Plan Note (Signed)
Renal function stable, will recheck again today with current diuresis.

## 2012-11-06 NOTE — Assessment & Plan Note (Signed)
Volume status remains elevated, ~5 pounds.  Will continue lasix 40 mg BID and add metolazone 2.5 mg today.  Will get BMET to follow renal function.  Will also recheck echo to ensure that systolic function remains normal.  Continue carvedilol and lisinopril.  Have discussed again low sodium diet and fluid restrictions.

## 2012-11-06 NOTE — Progress Notes (Signed)
Patient ID: LAL HARPENAU, male   DOB: [birth year], 57 y.o.   MRN: [id number]  Weight Range    145-148 pounds  Baseline proBNP   993.1 on 01/19/12  PCP: [name] : Dr Marlou Sa Opthalmologist: [name]  Guide IT Trial   HPI: Patrick [name] is a 57 y.o. African American male with a history of  sickle cell disease with anemia, NICM, EF 99991111 and systolic heart failure diagnosed 05/2011, no CAD by cath 05/2011, CKD stage III, and tobacco abuse.   Admitted in 12/12 with new onset HF. Diuresed and improved. 05/2011: EF 30-35% range (gloabl HK). RV normal function. Mod LAE. RHC after diuresis.  RA mean 2  RV 24/3  PA 22/6  PCWP mean 8  LV 96/7  AO 100/64  Oxygen saturations:  PA 78%  AO 99%  Cardiac Output (Fick) 10.4  Cardiac Index (Fick) 5.7   Admitted to [facility] on 10/17/2011  with recurrent HF. Diuresed approximately 20 pounds. D/C weight 138 pounds. An echo was repeated on 10/17/11 showed LVEF 30-35%, diffuse hypokinesis, diastolic dysfunction, moderately dilated LA, mildly dilated RA, RV normal function and PA systolic pressure Q000111Q mmHg.   01/17/12 ECHO 55%-60%   He returns for weekly follow up today.  He has been improving slowly with increase diuretic of lasix 40 mg BID.  His weight at home is now down to 155, he has lost 16 pounds.  He continues to have dyspnea with exertion.  +lower extremity edema.  No orthopnea/PND.  He is working on low sodium diet and fluid restrictions.    He is not on Sprironolactone due to hyperkalemia. Unable to titrate ace inhibitor due to hyperkalemia.   He is legally blind.   ROS: All systems negative except as listed in HPI, PMH and Problem List.  Past Medical History  Diagnosis Date  . Shortness of breath   . Anemia     sickle cell anemia  . Headache   . Swelling of extremity, right   . Swelling abdomen   . Swelling of extremity, left   . Sickle cell anemia   . CHF (congestive heart failure)   . Blood dyscrasia     SICKLE CELL   .  Blood transfusion     " NO REACTION TO TRANSFUSION"    Current Outpatient Prescriptions  Medication Sig Dispense Refill  . aspirin 81 MG tablet Take 81 mg by mouth daily.      . carvedilol (COREG) 25 MG tablet Take 1 tablet (25 mg total) by mouth 2 (two) times daily with a meal.  60 tablet  6  . furosemide (LASIX) 40 MG tablet Take 1 tablet (40 mg total) by mouth daily. Daily and as needed for lower extremity edema.  40 tablet  3  . lisinopril (PRINIVIL,ZESTRIL) 10 MG tablet Take 0.5 tablets (5 mg total) by mouth daily.  30 tablet  6  . Vitamin D, Ergocalciferol, (DRISDOL) 50000 UNITS CAPS Take 50,000 Units by mouth.       No current facility-administered medications for this encounter.     PHYSICAL EXAM: Filed Vitals:   11/06/12 1441  BP: 120/66  Pulse: 81  Weight: 152 lb 12.8 oz (69.31 kg)  SpO2: 96%    General:  Well appearing. No resp difficulty HEENT: normal except for mild scleral icterus Neck: supple. JVP 9-10. +V waves. Carotids 2+ bilaterally; no bruits. No lymphadenopathy or thryomegaly appreciated. Cor: PMI normal Regular rate & rhythm. No rubs, gallops or murmurs.  Lungs: clear    Abdomen: soft, nontender, mild distention. No hepatosplenomegaly. No bruits or masses. Good bowel sounds. Extremities: no cyanosis, rash, 1+ edema to knee bilaterally,  + clubbing. Neuro: alert & orientedx3, cranial nerves grossly intact. Moves all 4 extremities w/o difficulty. Affect pleasant.   ASSESSMENT & PLAN:

## 2012-11-06 NOTE — Patient Instructions (Addendum)
Take metolazone 2.5 mg tomorrow.  Continue lasix twice daily  Follow up 2 weeks.  Labs today  Repeat echo

## 2012-11-11 ENCOUNTER — Telehealth (HOSPITAL_COMMUNITY): Payer: Self-pay | Admitting: Adult Health

## 2012-11-11 NOTE — Telephone Encounter (Signed)
Provided lab results.   Instructed to hold lasix for 2 days then restart lasix 40 mg daily.   Check BMET 11/20/2012.   Mr Conboy verbalized understanding.   Anael Rosch 4:00 PM

## 2012-11-12 ENCOUNTER — Telehealth (HOSPITAL_COMMUNITY): Payer: Self-pay | Admitting: *Deleted

## 2012-11-12 NOTE — Telephone Encounter (Signed)
Received lab results from [facility], bun 45 cr 2.19 per [name], NP hold Lasix for 2 days then restart at 40 mg daily pt is aware and agreeable, he states wt has been stable if he notices wt increasing or edema he will call back, will recheck labs at [facility] next week

## 2012-11-20 ENCOUNTER — Ambulatory Visit (HOSPITAL_BASED_OUTPATIENT_CLINIC_OR_DEPARTMENT_OTHER)
Admission: RE | Admit: 2012-11-20 | Discharge: 2012-11-20 | Disposition: A | Discharge status: Home / Self Care | Payer: Medicaid Other | Source: Ambulatory Visit | Attending: Internal Medicine | Admitting: Internal Medicine

## 2012-11-20 ENCOUNTER — Encounter (HOSPITAL_COMMUNITY): Payer: Self-pay

## 2012-11-20 ENCOUNTER — Other Ambulatory Visit (HOSPITAL_COMMUNITY): Payer: Self-pay | Admitting: Internal Medicine

## 2012-11-20 ENCOUNTER — Inpatient Hospital Stay (HOSPITAL_COMMUNITY)
Admission: AD | Admit: 2012-11-20 | Discharge: 2012-11-23 | DRG: 291 | Disposition: A | Discharge status: Home / Self Care | Payer: Medicaid Other | Source: Ambulatory Visit | Attending: Internal Medicine | Admitting: Internal Medicine

## 2012-11-20 ENCOUNTER — Inpatient Hospital Stay (HOSPITAL_COMMUNITY): Payer: Medicaid Other

## 2012-11-20 ENCOUNTER — Ambulatory Visit (HOSPITAL_COMMUNITY)
Admission: RE | Admit: 2012-11-20 | Discharge: 2012-11-20 | Disposition: A | Discharge status: Home / Self Care | Payer: Medicaid Other | Source: Ambulatory Visit | Attending: Family Medicine | Admitting: Family Medicine

## 2012-11-20 VITALS — BP 115/60 | HR 75 | Wt 156.8 lb

## 2012-11-20 DIAGNOSIS — I369 Nonrheumatic tricuspid valve disorder, unspecified: Secondary | ICD-10-CM

## 2012-11-20 DIAGNOSIS — I509 Heart failure, unspecified: Secondary | ICD-10-CM

## 2012-11-20 DIAGNOSIS — R51 Headache: Secondary | ICD-10-CM | POA: Diagnosis present

## 2012-11-20 DIAGNOSIS — D571 Sickle-cell disease without crisis: Secondary | ICD-10-CM | POA: Diagnosis present

## 2012-11-20 DIAGNOSIS — D649 Anemia, unspecified: Secondary | ICD-10-CM

## 2012-11-20 DIAGNOSIS — N183 Chronic kidney disease, stage 3 unspecified: Secondary | ICD-10-CM | POA: Diagnosis present

## 2012-11-20 DIAGNOSIS — I5043 Acute on chronic combined systolic (congestive) and diastolic (congestive) heart failure: Secondary | ICD-10-CM

## 2012-11-20 DIAGNOSIS — H548 Legal blindness, as defined in USA: Secondary | ICD-10-CM | POA: Diagnosis present

## 2012-11-20 DIAGNOSIS — F17200 Nicotine dependence, unspecified, uncomplicated: Secondary | ICD-10-CM | POA: Diagnosis present

## 2012-11-20 DIAGNOSIS — J9600 Acute respiratory failure, unspecified whether with hypoxia or hypercapnia: Secondary | ICD-10-CM | POA: Diagnosis present

## 2012-11-20 DIAGNOSIS — K5900 Constipation, unspecified: Secondary | ICD-10-CM | POA: Diagnosis present

## 2012-11-20 DIAGNOSIS — I428 Other cardiomyopathies: Secondary | ICD-10-CM | POA: Diagnosis present

## 2012-11-20 DIAGNOSIS — I5022 Chronic systolic (congestive) heart failure: Secondary | ICD-10-CM

## 2012-11-20 DIAGNOSIS — I5023 Acute on chronic systolic (congestive) heart failure: Principal | ICD-10-CM | POA: Diagnosis present

## 2012-11-20 HISTORY — DX: Legal blindness, as defined in USA: H54.8

## 2012-11-20 LAB — CBC WITH DIFFERENTIAL/PLATELET
Basophils Absolute: 0.1 10*3/uL (ref 0.0–0.1)
Basophils Relative: 1 % (ref 0–1)
Eosinophils Absolute: 0.3 10*3/uL (ref 0.0–0.7)
Eosinophils Relative: 3 % (ref 0–5)
HCT: 14.6 % — ABNORMAL LOW (ref 39.0–52.0)
Hemoglobin: 5.7 g/dL — CL (ref 13.0–17.0)
Lymphocytes Relative: 16 % (ref 12–46)
Lymphs Abs: 1.7 10*3/uL (ref 0.7–4.0)
MCH: 34.1 pg — ABNORMAL HIGH (ref 26.0–34.0)
MCHC: 39 g/dL — ABNORMAL HIGH (ref 30.0–36.0)
MCV: 87.4 fL (ref 78.0–100.0)
Monocytes Absolute: 1.7 10*3/uL — ABNORMAL HIGH (ref 0.1–1.0)
Monocytes Relative: 16 % — ABNORMAL HIGH (ref 3–12)
Neutro Abs: 7.1 10*3/uL (ref 1.7–7.7)
Neutrophils Relative %: 64 % (ref 43–77)
Platelets: 160 10*3/uL (ref 150–400)
RBC: 1.67 MIL/uL — ABNORMAL LOW (ref 4.22–5.81)
RDW: 25.5 % — ABNORMAL HIGH (ref 11.5–15.5)
WBC: 10.9 10*3/uL — ABNORMAL HIGH (ref 4.0–10.5)
nRBC: 7 /100 WBC — ABNORMAL HIGH

## 2012-11-20 LAB — PREPARE RBC (CROSSMATCH)

## 2012-11-20 LAB — COMPREHENSIVE METABOLIC PANEL
ALT: 21 U/L (ref 0–53)
AST: 55 U/L — ABNORMAL HIGH (ref 0–37)
Albumin: 3 g/dL — ABNORMAL LOW (ref 3.5–5.2)
Alkaline Phosphatase: 126 U/L — ABNORMAL HIGH (ref 39–117)
BUN: 51 mg/dL — ABNORMAL HIGH (ref 6–23)
CO2: 17 mEq/L — ABNORMAL LOW (ref 19–32)
Calcium: 8.6 mg/dL (ref 8.4–10.5)
Chloride: 108 mEq/L (ref 96–112)
Creatinine, Ser: 2.04 mg/dL — ABNORMAL HIGH (ref 0.50–1.35)
GFR calc Af Amer: 41 mL/min — ABNORMAL LOW (ref >90)
GFR calc non Af Amer: 35 mL/min — ABNORMAL LOW (ref >90)
Glucose, Bld: 90 mg/dL (ref 70–99)
Potassium: 5 mEq/L (ref 3.5–5.1)
Sodium: 136 mEq/L (ref 135–145)
Total Bilirubin: 3.4 mg/dL — ABNORMAL HIGH (ref 0.3–1.2)
Total Protein: 6.2 g/dL (ref 6.0–8.3)

## 2012-11-20 LAB — MAGNESIUM: Magnesium: 2.3 mg/dL (ref 1.5–2.5)

## 2012-11-20 LAB — HEMOGLOBIN AND HEMATOCRIT, BLOOD
HCT: 13.2 % — ABNORMAL LOW (ref 39.0–52.0)
Hemoglobin: 5.1 g/dL — CL (ref 13.0–17.0)

## 2012-11-20 LAB — PRO B NATRIURETIC PEPTIDE: Pro B Natriuretic peptide (BNP): 6715 pg/mL — ABNORMAL HIGH (ref 0–125)

## 2012-11-20 MED ORDER — SODIUM CHLORIDE 0.9 % IJ SOLN: 3.0000 mL | INTRAMUSCULAR | Status: DC | PRN

## 2012-11-20 MED ORDER — CARVEDILOL 25 MG PO TABS
25.0000 mg | ORAL_TABLET | Freq: Two times a day (BID) | ORAL | Status: DC
  Administered 2012-11-21 – 2012-11-23 (×4): 25 mg via ORAL
  Filled 2012-11-20 (×7): qty 1

## 2012-11-20 MED ORDER — SODIUM CHLORIDE 0.9 % IV SOLN: 250.0000 mL | INTRAVENOUS | Status: DC | PRN

## 2012-11-20 MED ORDER — SODIUM CHLORIDE 0.9 % IJ SOLN
3.0000 mL | Freq: Two times a day (BID) | INTRAMUSCULAR | Status: DC
  Administered 2012-11-20 – 2012-11-23 (×6): 3 mL via INTRAVENOUS

## 2012-11-20 MED ORDER — FUROSEMIDE 10 MG/ML IJ SOLN
40.0000 mg | Freq: Once | INTRAMUSCULAR | Status: AC
  Administered 2012-11-21: 40 mg via INTRAVENOUS
  Filled 2012-11-20: qty 4

## 2012-11-20 MED ORDER — ENOXAPARIN SODIUM 30 MG/0.3ML ~~LOC~~ SOLN
30.0000 mg | SUBCUTANEOUS | Status: DC
  Filled 2012-11-20 (×4): qty 0.3

## 2012-11-20 MED ORDER — FUROSEMIDE 10 MG/ML IJ SOLN
80.0000 mg | Freq: Two times a day (BID) | INTRAMUSCULAR | Status: DC
  Administered 2012-11-20 – 2012-11-21 (×2): 80 mg via INTRAVENOUS
  Filled 2012-11-20 (×4): qty 8

## 2012-11-20 MED ORDER — ACETAMINOPHEN 325 MG PO TABS
650.0000 mg | ORAL_TABLET | ORAL | Status: DC | PRN
  Administered 2012-11-21 (×2): 650 mg via ORAL
  Filled 2012-11-20 (×2): qty 2

## 2012-11-20 MED ORDER — ASPIRIN EC 81 MG PO TBEC
81.0000 mg | DELAYED_RELEASE_TABLET | Freq: Every day | ORAL | Status: DC
  Administered 2012-11-21 – 2012-11-23 (×3): 81 mg via ORAL
  Filled 2012-11-20 (×4): qty 1

## 2012-11-20 MED ORDER — ONDANSETRON HCL 4 MG/2ML IJ SOLN: 4.0000 mg | Freq: Four times a day (QID) | INTRAMUSCULAR | Status: DC | PRN

## 2012-11-20 MED ORDER — LISINOPRIL 2.5 MG PO TABS
2.5000 mg | ORAL_TABLET | Freq: Every day | ORAL | Status: DC
  Filled 2012-11-20 (×2): qty 1

## 2012-11-20 NOTE — H&P (Signed)
Advanced Heart Failure   HPI: [name] is a 57 y.o. African American male with a history of sickle cell disease with anemia, NICM, EF 99991111 and systolic heart failure diagnosed 05/2011, no CAD by cath 05/2011, CKD stage III, legally blind, and tobacco abuse.   05/29/2012 RHC after diuresis.  RA mean 2  RV 24/3  PA 22/6  PCWP mean 8  LV 96/7  AO 100/64  Oxygen saturations:  PA 78%  AO 99%  Cardiac Output (Fick) 10.4  Cardiac Index (Fick) 5.7   01/17/12 ECHO EF 55-60% 11/20/12 ECHO  EF 35-40%   He presented to the HF clinic today with volume overload and dyspnea on exertion. SOB walking up steps. + Orthopnea + lower extremity edema. O2 saturations 84% with exertion into clinic and 92% on 2 liters [place].     Review of Systems:     Cardiac Review of Systems: {Y] = yes [ ]  = no  Chest Pain [    ]  Resting SOB [   ] Exertional SOB  Jazmín.Cullens  ]  Orthopnea [ Y ]   Pedal Edema [ Y  ]    Palpitations [  ] Syncope  [  ]   Presyncope [   ]  General Review of Systems: [Y] = yes [  ]=no Constitional: recent weight change [  ]; anorexia [  ]; fatigue [Y  ]; nausea [  ]; night sweats [  ]; fever [  ]; or chills [  ];                                                                                                                                       :   Eye : blurred vision [  ]; diplopia [   ]; vision changes [  ];  Amaurosis fugax[  ]; Resp: cough [  ];  wheezing[  ];  hemoptysis[  ]; shortness of breath[ Y ]; paroxysmal nocturnal dyspnea[  ]; dyspnea on exertion[ Y ]; or orthopnea[ Y ];  GI:  gallstones[  ], vomiting[  ];  dysphagia[  ]; melena[  ];  hematochezia [  ]; heartburn[  ];    GU: kidney stones [  ]; hematuria[  ];   dysuria [  ];  nocturia[  ];  history of     obstruction [  ];                 Skin: rash, swelling[  ];, hair loss[  ];  peripheral edema[  ];  or itching[  ]; Musculosketetal: myalgias[  ];  joint swelling[  ];  joint erythema[  ];  joint pain[  ];  back pain[   ];  Heme/Lymph: bruising[  ];  bleeding[  ];  anemia[ y];  Neuro: TIA[  ];  headaches[  ];  stroke[  ];  vertigo[  ];  seizures[  ];  paresthesias[  ];  difficulty walking[  ];  Psych:depression[  ]; anxiety[  ];  Endocrine: diabetes[  ];  thyroid dysfunction[  ];  Other:  Past Medical History  Diagnosis Date  . Shortness of breath   . Anemia     sickle cell anemia  . Headache(784.0)   . Swelling of extremity, right   . Swelling abdomen   . Swelling of extremity, left   . Sickle cell anemia   . CHF (congestive heart failure)   . Blood dyscrasia     SICKLE CELL   . Blood transfusion     " NO REACTION TO TRANSFUSION"    No prescriptions prior to admission     No Known Allergies  History   Social History  . Marital Status: Divorced    Spouse Name: N/A    Number of Children: N/A  . Years of Education: N/A   Occupational History  . Not on file.   Social History Main Topics  . Smoking status: Current Every Day Smoker -- 0.50 packs/day for 25 years    Types: Cigarettes  . Smokeless tobacco: Never Used  . Alcohol Use: No     Comment: rare  . Drug Use: No  . Sexually Active: Not Currently   Other Topics Concern  . Not on file   Social History Narrative  . No narrative on file    Family History  Problem Relation Age of Onset  . Alcohol abuse Mother   . Liver disease Mother   . Cirrhosis Mother     PHYSICAL EXAM: General: Chronically ill. No resp difficulty  HEENT: normal except for mild scleral icterus  Neck: supple. JVP to jaw +prominent CVV waves. Carotids 2+ bilaterally; no bruits. No lymphadenopathy or thryomegaly appreciated.  Cor: PMI normal Regular rate & rhythm. No rubs, gallops 2/6 TR and 2/6 MR  Lungs: clear  Abdomen: soft, nontender, + distention. No hepatosplenomegaly. No bruits or masses. Good bowel sounds.  Extremities: no cyanosis, rash, 2-3+ edema to knee bilaterally, + clubbing.  Neuro: alert & orientedx3, cranial nerves grossly intact.  Moves all 4 extremities w/o difficulty. Affect pleasant.    Results for orders placed during the hospital encounter of 11/20/12 (from the past 24 hour(s))  CBC WITH DIFFERENTIAL     Status: Abnormal   Collection Time    11/20/12  5:09 PM      Result Value Range   WBC 10.9 (*) 4.0 - 10.5 K/uL   RBC 1.67 (*) 4.22 - 5.81 MIL/uL   Hemoglobin 5.7 (*) 13.0 - 17.0 g/dL   HCT 14.6 (*) 39.0 - 52.0 %   MCV 87.4  78.0 - 100.0 fL   MCH 34.1 (*) 26.0 - 34.0 pg   MCHC 39.0 (*) 30.0 - 36.0 g/dL   RDW 25.5 (*) 11.5 - 15.5 %   Platelets 160  150 - 400 K/uL   Neutrophils Relative % 64  43 - 77 %   Lymphocytes Relative 16  12 - 46 %   Monocytes Relative 16 (*) 3 - 12 %   Eosinophils Relative 3  0 - 5 %   Basophils Relative 1  0 - 1 %   nRBC 7 (*) 0 /100 WBC   Neutro Abs 7.1  1.7 - 7.7 K/uL   Lymphs Abs 1.7  0.7 - 4.0 K/uL   Monocytes Absolute 1.7 (*) 0.1 - 1.0 K/uL   Eosinophils Absolute 0.3  0.0 - 0.7 K/uL   Basophils Absolute 0.1  0.0 - 0.1 K/uL   RBC Morphology TARGET CELLS     Smear Review LARGE PLATELETS PRESENT    COMPREHENSIVE METABOLIC PANEL     Status: Abnormal   Collection Time    11/20/12  5:09 PM      Result Value Range   Sodium 136  135 - 145 mEq/L   Potassium 5.0  3.5 - 5.1 mEq/L   Chloride 108  96 - 112 mEq/L   CO2 17 (*) 19 - 32 mEq/L   Glucose, Bld 90  70 - 99 mg/dL   BUN 51 (*) 6 - 23 mg/dL   Creatinine, Ser 2.04 (*) 0.50 - 1.35 mg/dL   Calcium 8.6  8.4 - 10.5 mg/dL   Total Protein 6.2  6.0 - 8.3 g/dL   Albumin 3.0 (*) 3.5 - 5.2 g/dL   AST 55 (*) 0 - 37 U/L   ALT 21  0 - 53 U/L   Alkaline Phosphatase 126 (*) 39 - 117 U/L   Total Bilirubin 3.4 (*) 0.3 - 1.2 mg/dL   GFR calc non Af Amer 35 (*) >90 mL/min   GFR calc Af Amer 41 (*) >90 mL/min  MAGNESIUM     Status: None   Collection Time    11/20/12  5:09 PM      Result Value Range   Magnesium 2.3  1.5 - 2.5 mg/dL  PRO B NATRIURETIC PEPTIDE     Status: Abnormal   Collection Time    11/20/12  5:37 PM      Result  Value Range   Pro B Natriuretic peptide (BNP) 6715.0 (*) 0 - 125 pg/mL  TYPE AND SCREEN     Status: None   Collection Time    11/20/12  7:34 PM      Result Value Range   ABO/RH(D) AB POS     Antibody Screen NEG     Sample Expiration 11/23/2012     Unit Number [id number]     Blood Component Type RED CELLS,LR     Unit division 00     Status of Unit ISSUED     Transfusion Status OK TO TRANSFUSE     Crossmatch Result Compatible    HEMOGLOBIN AND HEMATOCRIT, BLOOD     Status: Abnormal   Collection Time    11/20/12  7:56 PM      Result Value Range   Hemoglobin 5.1 (*) 13.0 - 17.0 g/dL   HCT 13.2 (*) 39.0 - 52.0 %  PREPARE RBC (CROSSMATCH)     Status: None   Collection Time    11/20/12  8:53 PM      Result Value Range   Order Confirmation ORDER PROCESSED BY BLOOD BANK     No results found.   ASSESSMENT: 1. A/C Systolic Heart Failure - ECHO EF 35-40% 11/20/12 2. Acute respiratory failure - O2 saturation 84% 3. Chronic Renal Failure - baseline creatinine 1.6 4. Sickle Cell Anemia 5. Tobacco Abuse  PLAN/DISCUSSION:  He is markedly volume overloaded with exertional hypoxemia and also recent worsening of his renal function. I have reviewed echo today personally with Dr. Harrington Challenger. EF 35-40% range. We will admit him for IV diuresis with lasix 80 IV bid.. Watch renal function closely. Will need assessment for home O2 prior to d/c.  Addasyn Mcbreen,MD 10:35 PM  Addendum: Admission labs with severe anemia. Hgb 5.1. Will transfuse 2u.  [name] 10:36 PM

## 2012-11-20 NOTE — Progress Notes (Signed)
Pt arrived to 4739 as a direct admit from the HF clinic.  Pt has been placed on telemetry and currently is NSR.  Vitals taken and are stable with BP 146/74, HR 77, R 18, and 95 % on 2L/02 via [place].  Pt has been oriented to unit and currently resting comfortably in bed.  Will continue to monitor. Gae Gallop RN

## 2012-11-20 NOTE — Progress Notes (Signed)
CRITICAL VALUE ALERT  Critical value received:  Hemoglobin 5.7  Date of notification:  11/20/12  Time of notification:  [id number]  Critical value read back:yes  Nurse who received alert:  Gae Gallop RN  MD notified (1st page):  Valeria Batman PA  Time of first page:  [phone]  MD notified (2nd page):  Time of second page:  Responding MD:  Valeria Batman PA  Time MD responded:  [phone]

## 2012-11-20 NOTE — Assessment & Plan Note (Addendum)
Patient seen and examined with [name], NP. We discussed all aspects of the encounter. I agree with the assessment as stated above. My thoughts below:  He significant volume overload with exertional hypoxemia and recent worsening of his renal function. Needs to be admitted for IV diuresis. May need home O2 on d/c. EF on echo looks to be about 35%.

## 2012-11-20 NOTE — Progress Notes (Signed)
  Echocardiogram 2D Echocardiogram has been performed.  Patrick [name] FRANCES 11/20/2012, 2:57 PM

## 2012-11-20 NOTE — Progress Notes (Signed)
Patient ID: Patrick [name], male   DOB: 09-28-1955, 57 y.o.   MRN: [id number]   Weight Range    145-148 pounds  Baseline proBNP   993.1 on 01/19/12  PCP: [name] : Dr Marlou Sa Opthalmologist: [name]  Guide IT Trial   HPI: Patrick [name] is a 57 y.o. African American male with a history of  sickle cell disease with anemia, NICM, EF 99991111 and systolic heart failure diagnosed 05/2011, no CAD by cath 05/2011, CKD stage III, and tobacco abuse.   Admitted in 12/12 with new onset HF. Diuresed and improved. 05/2011: EF 30-35% range (gloabl HK). RV normal function. Mod LAE. RHC after diuresis.  RA mean 2  RV 24/3  PA 22/6  PCWP mean 8  LV 96/7  AO 100/64  Oxygen saturations:  PA 78%  AO 99%  Cardiac Output (Fick) 10.4  Cardiac Index (Fick) 5.7   Admitted to [facility] on 10/17/2011  57 with recurrent HF. Diuresed approximately 20 pounds. D/C weight 138 pounds. An echo was repeated on 10/17/11 showed LVEF 30-35%, diffuse hypokinesis, diastolic dysfunction, moderately dilated LA, mildly dilated RA, RV normal function and PA systolic pressure Q000111Q mmHg.   01/17/12 ECHO 55%-60%  11/20/12 ECHO EF  pending  Last week he was instructed to hold lasix for 2 days due to elevated creatinine.   He returns for follow up today.  Last office visit he was instructed to take Metolazone due to increased volume status. Complains of fatigue that started 3 days ago. SOB with exertion. Unable to walk up steps.  Denies PND. + Orthopnea. + edema. Ab bloating.  Complains of constipation. Taking all medications. Weight up 4 pounds from last visit but 18 pounds from previous discharge.     He is not on Sprironolactone due to hyperkalemia. Unable to titrate ace inhibitor due to hyperkalemia.   He is legally blind.   ROS: All systems negative except as listed in HPI, PMH and Problem List.  Past Medical History  Diagnosis Date  . Shortness of breath   . Anemia     sickle cell anemia  . Headache(784.0)   .  Swelling of extremity, right   . Swelling abdomen   . Swelling of extremity, left   . Sickle cell anemia   . CHF (congestive heart failure)   . Blood dyscrasia     SICKLE CELL   . Blood transfusion     " NO REACTION TO TRANSFUSION"    Current Outpatient Prescriptions  Medication Sig Dispense Refill  . aspirin 81 MG tablet Take 81 mg by mouth daily.      . carvedilol (COREG) 25 MG tablet Take 1 tablet (25 mg total) by mouth 2 (two) times daily with a meal.  60 tablet  6  . furosemide (LASIX) 40 MG tablet Take 1 tablet (40 mg total) by mouth daily. Daily and as needed for lower extremity edema.  40 tablet  3  . lisinopril (PRINIVIL,ZESTRIL) 10 MG tablet Take 0.5 tablets (5 mg total) by mouth daily.  30 tablet  6  . Vitamin D, Ergocalciferol, (DRISDOL) 50000 UNITS CAPS Take 50,000 Units by mouth.       No current facility-administered medications for this encounter.     PHYSICAL EXAM: Filed Vitals:   11/20/12 1500  BP: 115/60  Pulse: 75  Weight: 156 lb 12.8 oz (71.124 kg)  SpO2: 84%    General:  Chronically ill.  No resp difficulty HEENT: normal except for mild scleral  icterus Neck: supple. JVP to jaw +prominent CVV waves. Carotids 2+ bilaterally; no bruits. No lymphadenopathy or thryomegaly appreciated. Cor: PMI normal Regular rate & rhythm. No rubs, gallops 2/6 TR and 2/6 MR Lungs: clear    Abdomen: soft, nontender, + distention. No hepatosplenomegaly. No bruits or masses. Good bowel sounds. Extremities: no cyanosis, rash, 2-3+ edema to knee bilaterally,  + clubbing. Neuro: alert & orientedx3, cranial nerves grossly intact. Moves all 4 extremities w/o difficulty. Affect pleasant.   ASSESSMENT & PLAN:

## 2012-11-21 ENCOUNTER — Encounter (HOSPITAL_COMMUNITY): Payer: Self-pay | Admitting: General Practice

## 2012-11-21 DIAGNOSIS — D571 Sickle-cell disease without crisis: Secondary | ICD-10-CM

## 2012-11-21 DIAGNOSIS — N183 Chronic kidney disease, stage 3 unspecified: Secondary | ICD-10-CM

## 2012-11-21 LAB — BASIC METABOLIC PANEL
BUN: 50 mg/dL — ABNORMAL HIGH (ref 6–23)
CO2: 19 mEq/L (ref 19–32)
Calcium: 8.5 mg/dL (ref 8.4–10.5)
Chloride: 108 mEq/L (ref 96–112)
Creatinine, Ser: 1.99 mg/dL — ABNORMAL HIGH (ref 0.50–1.35)
GFR calc Af Amer: 42 mL/min — ABNORMAL LOW (ref >90)
GFR calc non Af Amer: 36 mL/min — ABNORMAL LOW (ref >90)
Glucose, Bld: 90 mg/dL (ref 70–99)
Potassium: 4.5 mEq/L (ref 3.5–5.1)
Sodium: 137 mEq/L (ref 135–145)

## 2012-11-21 LAB — FERRITIN: Ferritin: 112 ng/mL (ref 22–322)

## 2012-11-21 LAB — IRON AND TIBC
Iron: 124 ug/dL (ref 42–135)
Saturation Ratios: 54 % (ref 20–55)
TIBC: 231 ug/dL (ref 215–435)
UIBC: 107 ug/dL — ABNORMAL LOW (ref 125–400)

## 2012-11-21 LAB — CBC
HCT: 18.2 % — ABNORMAL LOW (ref 39.0–52.0)
HCT: 18.1 % — ABNORMAL LOW (ref 39.0–52.0)
Hemoglobin: 6.9 g/dL — CL (ref 13.0–17.0)
Hemoglobin: 6.7 g/dL — CL (ref 13.0–17.0)
MCH: 32.1 pg (ref 26.0–34.0)
MCH: 31.8 pg (ref 26.0–34.0)
MCHC: 37.9 g/dL — ABNORMAL HIGH (ref 30.0–36.0)
MCHC: 37 g/dL — ABNORMAL HIGH (ref 30.0–36.0)
MCV: 84.7 fL (ref 78.0–100.0)
MCV: 85.8 fL (ref 78.0–100.0)
Platelets: 153 10*3/uL (ref 150–400)
Platelets: 166 10*3/uL (ref 150–400)
RBC: 2.15 MIL/uL — ABNORMAL LOW (ref 4.22–5.81)
RBC: 2.11 MIL/uL — ABNORMAL LOW (ref 4.22–5.81)
RDW: 22.2 % — ABNORMAL HIGH (ref 11.5–15.5)
RDW: 22.2 % — ABNORMAL HIGH (ref 11.5–15.5)
WBC: 9.5 10*3/uL (ref 4.0–10.5)
WBC: 9.6 10*3/uL (ref 4.0–10.5)

## 2012-11-21 LAB — PREPARE RBC (CROSSMATCH)

## 2012-11-21 MED ORDER — FUROSEMIDE 10 MG/ML IJ SOLN
80.0000 mg | Freq: Two times a day (BID) | INTRAMUSCULAR | Status: DC
  Administered 2012-11-21 – 2012-11-23 (×4): 80 mg via INTRAVENOUS
  Filled 2012-11-21 (×5): qty 8

## 2012-11-21 NOTE — Care Management Note (Signed)
    Page 1 of 1   11/21/2012     4:18:44 PM   CARE MANAGEMENT NOTE 11/21/2012  Patient:  Patrick [name], Patrick [name]   Account Number:  [id number]  Date Initiated:  11/21/2012  Documentation initiated by:  Llana Aliment  Subjective/Objective Assessment:   57yo male admitted with CHF.  Pt. lives with aunt, and cousin in [place].     Action/Plan:   progression and discharge planning   Anticipated DC Date:  11/24/2012   Anticipated DC Plan:  [place]  CM consult      Choice offered to / List presented to:             Status of service:  In process, will continue to follow Medicare Important Message given?   (If response is "NO", the following Medicare IM given date fields will be blank) Date Medicare IM given:   Date Additional Medicare IM given:    Discharge Disposition:    Per UR Regulation:  Reviewed for med. necessity/level of care/duration of stay  If discussed at [place] of Stay Meetings, dates discussed:    Comments:  11/21/12 1610 In to speak with pt. about Heart Failure home health screen.  Pt. states, at this time he does not want home health services.  He states his aunt helps take care of him.  In addition, pt. states financial aid called him today to help him sign up for Medicaid.  Pt. may dc over weekend. Llana Aliment, RN, BSN NCM [phone]

## 2012-11-21 NOTE — Progress Notes (Signed)
Advanced Heart Failure Rounding Note   Subjective:    Mr. Sharaf is a 57 y.o. African American male with a history of sickle cell disease with anemia, NICM, EF 99991111 and systolic heart failure diagnosed 05/2011, no CAD by cath 05/2011, CKD stage III, legally blind, and tobacco abuse.   01/17/12 ECHO EF 55-60%  11/20/12 ECHO EF 35-40%   Yesterday he was admitted from HF clinic with increase dyspnea and volume overload. Admit weight 151 pounds. (baseline weight 145 pounds). Admit  Hemoglobin was 5.7 and he received 2 UPRBC. Started on IV lasix. Diuresed 1.3 liters overnight.   Dyspnea improving. Denies orthopnea/PND.   Hemoglobin 5.7> 6.9  Cr 2.04 -> 1.99   Objective:   Weight Range:  Vital Signs:   Temp:  [97.6 F (36.4 C)-98.3 F (36.8 C)] 98 F (36.7 C) (06/06 0500) Pulse Rate:  [67-77] 71 (06/06 0500) Resp:  [18-20] 18 (06/06 0500) BP: (110-146)/(60-87) 128/87 mmHg (06/06 0500) SpO2:  [84 %-100 %] 100 % (06/06 0500) Weight:  [148 lb 3.2 oz (67.223 kg)-156 lb 12.8 oz (71.124 kg)] 148 lb 3.2 oz (67.223 kg) (06/06 0500) Last BM Date: 11/19/12  Weight change: Filed Weights   11/20/12 1630 11/21/12 0500  Weight: 151 lb 9.6 oz (68.765 kg) 148 lb 3.2 oz (67.223 kg)    Intake/Output:   Intake/Output Summary (Last 24 hours) at 11/21/12 0718 Last data filed at 11/21/12 [id number]  Gross per 24 hour  Intake   1400 ml  Output   2750 ml  Net  -1350 ml     PHYSICAL EXAM:  General: Chronically ill. No resp difficulty  HEENT: normal except for mild scleral icterus  Neck: supple. JVP to jaw +prominent CV waves. Carotids 2+ bilaterally; no bruits. No lymphadenopathy or thryomegaly appreciated.  Cor: PMI normal Regular rate & rhythm. No rubs, gallops 2/6 TR and 2/6 MR  Lungs: clear  Abdomen: soft, nontender, + distention. No hepatosplenomegaly. No bruits or masses. Good bowel sounds.  Extremities: no cyanosis, rash,  R and LLE tr-1+ edema to knee bilaterally, + clubbing.  Neuro:  alert & orientedx3, cranial nerves grossly intact. Moves all 4 extremities w/o difficulty. Affect pleasant.    Telemetry: SR  Labs: Basic Metabolic Panel:  Recent Labs Lab 11/20/12 1709 11/21/12 0650  NA 136 137  K 5.0 4.5  CL 108 108  CO2 17* 19  GLUCOSE 90 90  BUN 51* 50*  CREATININE 2.04* 1.99*  CALCIUM 8.6 8.5  MG 2.3  --     Liver Function Tests:  Recent Labs Lab 11/20/12 1709  AST 55*  ALT 21  ALKPHOS 126*  BILITOT 3.4*  PROT 6.2  ALBUMIN 3.0*   No results found for this basename: LIPASE, AMYLASE,  in the last 168 hours No results found for this basename: AMMONIA,  in the last 168 hours  CBC:  Recent Labs Lab 11/20/12 1709 11/20/12 1956 11/21/12 0650 11/21/12 0802  WBC 10.9*  --  9.5 9.6  NEUTROABS 7.1  --   --   --   HGB 5.7* 5.1* 6.9* 6.7*  HCT 14.6* 13.2* 18.2* 18.1*  MCV 87.4  --  84.7 85.8  PLT 160  --  153 166    Cardiac Enzymes: No results found for this basename: CKTOTAL, CKMB, CKMBINDEX, TROPONINI,  in the last 168 hours  BNP: BNP (last 3 results)  Recent Labs  01/17/12 1010 01/19/12 0600 11/20/12 1737  PROBNP 1782.0* 993.1* 6715.0*     Other results:  Imaging: Dg Chest Port 1 View  11/20/2012   *RADIOLOGY REPORT*  Clinical Data: Short of breath.  Dyspnea.  PORTABLE CHEST - 1 VIEW  Comparison: 10/17/2011.  Findings: Cardiomegaly and pulmonary vascular congestion. Interstitial pulmonary edema is present with Kerley B lines. Findings compatible with mild CHF. Monitoring leads are projected over the chest. Aortic arch atherosclerosis is present.  IMPRESSION: Mild CHF with interstitial pulmonary edema.   Original Report Authenticated By: Dereck Ligas, M.D.      Medications:     Scheduled Medications: . aspirin EC  81 mg Oral Daily  . carvedilol  25 mg Oral BID WC  . enoxaparin (LOVENOX) injection  30 mg Subcutaneous Q24H  . furosemide  80 mg Intravenous BID  . lisinopril  2.5 mg Oral Daily  . sodium chloride  3 mL  Intravenous Q12H     Infusions:     PRN Medications:  sodium chloride, acetaminophen, ondansetron (ZOFRAN) IV, sodium chloride   Assessment:  1. A/C Systolic Heart Failure - ECHO EF 35-40% 11/20/12  2. Acute respiratory failure - O2 saturation 84%  3. Chronic Renal Failure - baseline creatinine 1.6  4. Sickle Cell Anemia- Hemoglobin 5.1  5. Tobacco Abuse 6. Legally Blind   Plan/Discussion:    Volume status remains elevated but not surprising because he received 2UPRBC last night. Continue to diurese with IV lasix. Weight up 6-8 pounds. Stop Ace Inhibitor due to elevated creatinine.   Hemoglobin up to 6.9 from 5.1. Repeat CBC in am.    Length of Stay: 1   [name],AMY  11/21/2012, 7:18 AM  Advanced Heart Failure Team Pager [phone] (M-F; 7a - 4p)  Please contact [facility] for night-coverage after hours (4p -7a ) and weekends on [email or web address]  Patient seen and examined with [name], NP. We discussed all aspects of the encounter. I agree with the assessment and plan as stated above.   Improving with diuresis. Continue IV diuresis at least one more day.Baseline weight about 140-145. Hemoglobin still low. Baseline 7.0-7.5. Will transfuse 1 more unit. Hold ACE-I now due to CRI and low BP. Likely home on Sunday.  Renika Shiflet,MD 11:54 AM

## 2012-11-21 NOTE — Progress Notes (Signed)
1415 Blood transfusion   Process started . Education done including but not limited to s.s of blood transfusion reactions. Verbalized undesatanding

## 2012-11-21 NOTE — Progress Notes (Signed)
[place] Dr. Haroldine Laws seen the pt . Made  Aware of hgb/hct results with orders

## 2012-11-21 NOTE — Progress Notes (Signed)
Utilization Review Completed.   Win Guajardo, RN, BSN Nurse Case Manager  [phone]  

## 2012-11-21 NOTE — Progress Notes (Signed)
0930 lab results : hgb.=6.9  Hct= 18.2 no sob  No overt signs of bleeding .Placed a call to [name] ,NP

## 2012-11-22 LAB — CBC WITH DIFFERENTIAL/PLATELET
Basophils Absolute: 0.1 10*3/uL (ref 0.0–0.1)
Basophils Relative: 1 % (ref 0–1)
Eosinophils Absolute: 0.4 10*3/uL (ref 0.0–0.7)
Eosinophils Relative: 5 % (ref 0–5)
HCT: 20.3 % — ABNORMAL LOW (ref 39.0–52.0)
Hemoglobin: 7.7 g/dL — ABNORMAL LOW (ref 13.0–17.0)
Lymphocytes Relative: 12 % (ref 12–46)
Lymphs Abs: 1 10*3/uL (ref 0.7–4.0)
MCH: 31.8 pg (ref 26.0–34.0)
MCHC: 37.9 g/dL — ABNORMAL HIGH (ref 30.0–36.0)
MCV: 83.9 fL (ref 78.0–100.0)
Monocytes Absolute: 1.7 10*3/uL — ABNORMAL HIGH (ref 0.1–1.0)
Monocytes Relative: 20 % — ABNORMAL HIGH (ref 3–12)
Neutro Abs: 5.2 10*3/uL (ref 1.7–7.7)
Neutrophils Relative %: 62 % (ref 43–77)
Platelets: 158 10*3/uL (ref 150–400)
RBC: 2.42 MIL/uL — ABNORMAL LOW (ref 4.22–5.81)
RDW: 22.4 % — ABNORMAL HIGH (ref 11.5–15.5)
WBC: 8.4 10*3/uL (ref 4.0–10.5)

## 2012-11-22 LAB — BASIC METABOLIC PANEL
BUN: 50 mg/dL — ABNORMAL HIGH (ref 6–23)
CO2: 19 mEq/L (ref 19–32)
Calcium: 8.1 mg/dL — ABNORMAL LOW (ref 8.4–10.5)
Chloride: 107 mEq/L (ref 96–112)
Creatinine, Ser: 2.11 mg/dL — ABNORMAL HIGH (ref 0.50–1.35)
GFR calc Af Amer: 39 mL/min — ABNORMAL LOW (ref >90)
GFR calc non Af Amer: 34 mL/min — ABNORMAL LOW (ref >90)
Glucose, Bld: 89 mg/dL (ref 70–99)
Potassium: 5 mEq/L (ref 3.5–5.1)
Sodium: 136 mEq/L (ref 135–145)

## 2012-11-22 LAB — TYPE AND SCREEN
ABO/RH(D): AB POS
Antibody Screen: NEGATIVE
Unit division: 0
Unit division: 0
Unit division: 0

## 2012-11-22 LAB — OCCULT BLOOD X 1 CARD TO LAB, STOOL: Fecal Occult Bld: NEGATIVE

## 2012-11-22 NOTE — Progress Notes (Signed)
1200 Dr. Lovena Le aware of Hgb/Hct today . No blood transfusion  Today . Fecal occult Negative

## 2012-11-22 NOTE — Progress Notes (Signed)
Patient ID: Patrick [name], male   DOB: April 28, 1956, 57 y.o.   MRN: [id number] Subjective:  " I am breathing better"  Objective:  Vital Signs in the last 24 hours: Temp:  [98.1 F (36.7 C)-98.8 F (37.1 C)] 98.1 F (36.7 C) (06/07 0513) Pulse Rate:  [65-75] 65 (06/07 0513) Resp:  [16-18] 17 (06/07 0513) BP: (99-118)/(51-69) 117/69 mmHg (06/07 0513) SpO2:  [92 %-95 %] 95 % (06/07 0513) Weight:  [147 lb 3.2 oz (66.769 kg)] 147 lb 3.2 oz (66.769 kg) (06/07 0513)  Intake/Output from previous day: 06/06 0701 - 06/07 0700 In: 1683 [P.O.:930; I.V.:53; Blood:700] Out: 2600 [Urine:2600] Intake/Output from this shift: Total I/O In: 220 [P.O.:220] Out: -   Physical Exam: Blind but otherwise Well appearing middle aged man, NAD HEENT: Unremarkable Neck:  7 cm JVD, no thyromegally Lungs:  Clear with no wheezes. Minimal basilar rales HEART:  Regular rate rhythm, no murmurs, no rubs, no clicks Abd:  soft, positive bowel sounds, no organomegally, no rebound, no guarding Ext:  2 plus pulses, no edema, no cyanosis, no clubbing Skin:  No rashes no nodules Neuro:  CN II through XII intact, motor grossly intact  Lab Results:  Recent Labs  11/21/12 0650 11/21/12 0802  WBC 9.5 9.6  HGB 6.9* 6.7*  PLT 153 166    Recent Labs  11/21/12 0650 11/22/12 0525  NA 137 136  K 4.5 5.0  CL 108 107  CO2 19 19  GLUCOSE 90 89  BUN 50* 50*  CREATININE 1.99* 2.11*   No results found for this basename: TROPONINI, CK, MB,  in the last 72 hours Hepatic Function Panel  Recent Labs  11/20/12 1709  PROT 6.2  ALBUMIN 3.0*  AST 55*  ALT 21  ALKPHOS 126*  BILITOT 3.4*   No results found for this basename: CHOL,  in the last 72 hours No results found for this basename: PROTIME,  in the last 72 hours  Imaging: Dg Chest Port 1 View  11/20/2012   *RADIOLOGY REPORT*  Clinical Data: Short of breath.  Dyspnea.  PORTABLE CHEST - 1 VIEW  Comparison: 10/17/2011.  Findings: Cardiomegaly and  pulmonary vascular congestion. Interstitial pulmonary edema is present with Kerley B lines. Findings compatible with mild CHF. Monitoring leads are projected over the chest. Aortic arch atherosclerosis is present.  IMPRESSION: Mild CHF with interstitial pulmonary edema.   Original Report Authenticated By: Dereck Ligas, M.D.    Cardiac Studies: Tele - nsr Assessment/Plan:  1. Acute on chronic systolic CHF 2. Sickle cell anemia 3. S/p transfusion Rec: will transition to po lasix after today with plan to discharge home tomorrow. Weight is down and symptoms are much improved.  LOS: 2 days    Cristopher Peru 11/22/2012, 11:39 AM

## 2012-11-22 NOTE — Progress Notes (Signed)
1215 staying in bed at most times  Encouraged ambulation  Assistance offered

## 2012-11-22 NOTE — Progress Notes (Signed)
Received patient alert and oriented. On room air; not in distress. Complaining of pain on bilateral lower extremities with pain scale 4/10. Tylenol PRN was given as ordered. Other initial assessment, please see flow sheet. Will monitor patient accordingly.

## 2012-11-23 DIAGNOSIS — I509 Heart failure, unspecified: Secondary | ICD-10-CM

## 2012-11-23 DIAGNOSIS — I5043 Acute on chronic combined systolic (congestive) and diastolic (congestive) heart failure: Secondary | ICD-10-CM

## 2012-11-23 LAB — BASIC METABOLIC PANEL
BUN: 55 mg/dL — ABNORMAL HIGH (ref 6–23)
CO2: 21 mEq/L (ref 19–32)
Calcium: 8.4 mg/dL (ref 8.4–10.5)
Chloride: 109 mEq/L (ref 96–112)
Creatinine, Ser: 2.11 mg/dL — ABNORMAL HIGH (ref 0.50–1.35)
GFR calc Af Amer: 39 mL/min — ABNORMAL LOW (ref >90)
GFR calc non Af Amer: 34 mL/min — ABNORMAL LOW (ref >90)
Glucose, Bld: 89 mg/dL (ref 70–99)
Potassium: 4.7 mEq/L (ref 3.5–5.1)
Sodium: 138 mEq/L (ref 135–145)

## 2012-11-23 MED ORDER — FUROSEMIDE 40 MG PO TABS: 60.0000 mg | ORAL_TABLET | Freq: Every day | ORAL | Status: DC

## 2012-11-23 MED ORDER — FUROSEMIDE 40 MG PO TABS
60.0000 mg | ORAL_TABLET | Freq: Every day | ORAL | Status: DC
  Filled 2012-11-23: qty 1

## 2012-11-23 NOTE — Progress Notes (Addendum)
Patient ID: Patrick [name], male   DOB: [birth year], 57 y.o.   MRN: [id number] Subjective:  " I am ok.", denies c/p or sob or peripheral edema.  Objective:  Vital Signs in the last 24 hours: Temp:  [98.1 F (36.7 C)-98.3 F (36.8 C)] 98.3 F (36.8 C) (06/08 0501) Pulse Rate:  [64-71] 64 (06/08 0501) Resp:  [18-19] 18 (06/08 0501) BP: (116-122)/(62-69) 122/69 mmHg (06/08 0501) SpO2:  [92 %-94 %] 94 % (06/08 0501) Weight:  [143 lb 8 oz (65.091 kg)] 143 lb 8 oz (65.091 kg) (06/08 0501)  Intake/Output from previous day: 06/07 0701 - 06/08 0700 In: 11 [P.O.:880] Out: 1850 [Urine:1850] Intake/Output from this shift:    Physical Exam: Blind but otherwise Well appearing middle aged man, NAD HEENT: Unremarkable Neck:  no JVD, no thyromegally Lungs:  Clear with no wheezes. Minimal basilar rales HEART:  Regular rate rhythm, no murmurs, no rubs, no clicks Abd:  soft, positive bowel sounds, no organomegally, no rebound, no guarding Ext:  2 plus pulses, no edema, no cyanosis, no clubbing Skin:  No rashes no nodules Neuro:  CN II through XII intact, motor grossly intact  Lab Results:  Recent Labs  11/21/12 0802 11/22/12 1050  WBC 9.6 8.4  HGB 6.7* 7.7*  PLT 166 158    Recent Labs  11/22/12 0525 11/23/12 0511  NA 136 138  K 5.0 4.7  CL 107 109  CO2 19 21  GLUCOSE 89 89  BUN 50* 55*  CREATININE 2.11* 2.11*   No results found for this basename: TROPONINI, CK, MB,  in the last 72 hours Hepatic Function Panel  Recent Labs  11/20/12 1709  PROT 6.2  ALBUMIN 3.0*  AST 55*  ALT 21  ALKPHOS 126*  BILITOT 3.4*   No results found for this basename: CHOL,  in the last 72 hours No results found for this basename: PROTIME,  in the last 72 hours  Imaging: No results found.  Cardiac Studies: Tele - nsr Assessment/Plan:  1. Acute on chronic systolic CHF 2. Sickle cell anemia 3. S/p transfusion Rec: ok for discharge today. Weight is down and symptoms are much  improved. Discharge home on pre-admit meds except increase lasix to 60 mg daily. Followup with Dr. Reine Just in CHF clinic.  LOS: 3 days    Cloy Cozzens,M.D. 11/23/2012, 9:22 AM

## 2012-11-23 NOTE — Progress Notes (Signed)
Patient resting. No C/O chest pain or shortness of breath.

## 2012-11-23 NOTE — Discharge Summary (Signed)
Discharge Summary   Patient ID: Patrick [name], MRN: [id number], DOB/AGE: [birth year] 55 y.o.  Admit date: 11/20/2012 Discharge date: 11/23/2012   Primary Care Physician:  Marlou Sa, ERIC   Primary Cardiologist:  Dr. Glori Bickers   Reason for Admission:  A/C Systolic CHF  Primary Discharge Diagnoses:  1. A/C Systolic CHF - compensated at d/c 2. Acute on Chronic Anemia - s/p transfusion with 3 units PRBCs this admission 3. Sickle Cell Anemia 4. Chronic Kidney Disease - ACE inhibitor held this admission 5. Non-Ischemic Cardiomyopathy   Secondary Discharge Diagnoses:   Past Medical History  Diagnosis Date  . Swelling of extremity, right   . Swelling abdomen   . Swelling of extremity, left   . CHF (congestive heart failure)   . Blood transfusion     "today is the second time I've had a blood transfusion" (11/21/2012)  . Sickle cell anemia   . Blood dyscrasia   . Exertional shortness of breath   . Legally blind      Allergies:   No Known Allergies    Procedures Performed This Admission:   None   Hospital Course:  Patrick [name] is a 57 y.o. male with a history of sickle cell disease with anemia, NICM, EF 99991111 and systolic heart failure diagnosed 05/2011, no CAD by cath 05/2011, CKD stage III, legally blind, and tobacco abuse.  He presented to the heart failure clinic on the date of admission with volume overload, increased dyspnea with exertion, orthopnea, LE edema and hypoxia.  Previous echo had demonstrated improved EF.  However, EF noted to be depressed on echo done 6/5 (EF 35-40%).  He was admitted for IV diuresis. Hemoglobin was markedly depressed at 5.1 and he was transfused with 2 units PRBCs. ACE inhibitor was held due to increased creatinine and low blood pressure.  There was marginal improvement in his hemoglobin and he was transfused 1 more unit of PRBCs. Hgb was more stable at d/c.  He continued to diurese well. Weight is down 8 pounds at discharge.  He  diuresed over 3 L. On the morning of 11/23/12, he was evaluated by Dr. Lovena Le and felt to be improved and stable for discharge to home. He will need early follow up at the heart failure clinic.  Patient will be sent home on higher dose of Lasix at 60 mg QD.  Will hold off on Lisinopril until seen back in CHF clinic.  Will need a follow up BMET.    Discharge Vitals:   Blood pressure 106/69, pulse 62, temperature 97.6 F (36.4 C), temperature source Oral, resp. rate 20, height 5\' 10"  (1.778 m), weight 143 lb 8 oz (65.091 kg), SpO2 100.00%.  Labs:  Recent Labs  11/21/12 0650 11/21/12 0802 11/22/12 1050  WBC 9.5 9.6 8.4  HGB 6.9* 6.7* 7.7*  HCT 18.2* 18.1* 20.3*  MCV 84.7 85.8 83.9  PLT 153 166 158    Recent Labs  11/20/12 1709 11/21/12 0650 11/22/12 0525 11/23/12 0511  NA 136 137 136 138  K 5.0 4.5 5.0 4.7  CL 108 108 107 109  CO2 17* 19 19 21   BUN 51* 50* 50* 55*  CREATININE 2.04* 1.99* 2.11* 2.11*  CALCIUM 8.6 8.5 8.1* 8.4  PROT 6.2  --   --   --   BILITOT 3.4*  --   --   --   ALKPHOS 126*  --   --   --   ALT 21  --   --   --  AST 55*  --   --   --    No results found for this basename: CKTOTAL, CKMB, TROPONINI,  in the last 72 hours No results found for this basename: CHOL,  HDL,  LDLCALC,  TRIG   No results found for this basename: DDIMER   Lab Results  Component Value Date   TSH 2.960 10/17/2011   No results found for this basename: INR,  in the last 72 hours  Diagnostic Procedures and Studies:  Dg Chest Port 1 View  11/20/2012    IMPRESSION: Mild CHF with interstitial pulmonary edema.   Original Report Authenticated By: Dereck Ligas, M.D.    2D Echocardiogram 11/20/12:  Study Conclusions  - Left ventricle: The cavity size was severely dilated. Wall thickness was increased in a pattern of mild LVH. Systolic function was moderately reduced. The estimated ejection fraction was in the range of 40%. In comparison to images of Aug 0000000, systolic function is  depressed. - Mitral valve: Mild regurgitation. - Left atrium: The atrium was severely dilated. - Right atrium: The atrium was moderately dilated. - Tricuspid valve: Moderate regurgitation. - Pulmonary arteries: PA peak pressure: 69mm Hg (S). - Pericardium, extracardiac: A trivial pericardial effusion was identified.   Disposition:   Pt is being discharged home today in good condition.  Follow-up Plans & Appointments      Follow-up Information   Follow up with Glori Bickers, MD On 12/01/2012. (at 1:40 Garage Code [id number])    Contact information:   [place] [address] [phone]       Discharge Medications    Medication List    STOP taking these medications       lisinopril 10 MG tablet  Commonly known as:  PRINIVIL,ZESTRIL      TAKE these medications       aspirin 81 MG tablet  Take 81 mg by mouth daily.     carvedilol 25 MG tablet  Commonly known as:  COREG  Take 1 tablet (25 mg total) by mouth 2 (two) times daily with a meal.     cholecalciferol 1000 UNITS tablet  Commonly known as:  VITAMIN D  Take 1,000 Units by mouth daily.     furosemide 40 MG tablet  Commonly known as:  LASIX  Take 1.5 tablets (60 mg total) by mouth daily.         Outstanding Labs/Studies  1. BMET - should be drawn at f/u on 6/16 with Dr. Glori Bickers   Duration of Discharge Encounter: Greater than 30 minutes including physician and PA time.  Signed, Richardson Dopp, PA-C  10:12 AM 11/23/2012

## 2012-11-23 NOTE — Progress Notes (Signed)
Discharged for home - escort via wheelchair to vehicle transport.  Stated understanding of all discharge instructions, including f/u appt with Heart Failure Clinic.  No voiced complaints.

## 2012-11-26 ENCOUNTER — Encounter: Payer: Self-pay | Admitting: Internal Medicine

## 2012-12-01 ENCOUNTER — Ambulatory Visit (HOSPITAL_COMMUNITY): Payer: MEDICAID

## 2012-12-04 ENCOUNTER — Encounter (HOSPITAL_COMMUNITY): Payer: Self-pay

## 2012-12-04 ENCOUNTER — Ambulatory Visit (HOSPITAL_COMMUNITY)
Admission: RE | Admit: 2012-12-04 | Discharge: 2012-12-04 | Disposition: A | Discharge status: Home / Self Care | Payer: Medicaid Other | Source: Ambulatory Visit | Attending: Internal Medicine | Admitting: Internal Medicine

## 2012-12-04 VITALS — BP 110/62 | HR 68 | Ht 70.0 in | Wt 147.0 lb

## 2012-12-04 DIAGNOSIS — N183 Chronic kidney disease, stage 3 unspecified: Secondary | ICD-10-CM

## 2012-12-04 DIAGNOSIS — F17200 Nicotine dependence, unspecified, uncomplicated: Secondary | ICD-10-CM | POA: Insufficient documentation

## 2012-12-04 DIAGNOSIS — Z79899 Other long term (current) drug therapy: Secondary | ICD-10-CM | POA: Insufficient documentation

## 2012-12-04 DIAGNOSIS — Z7982 Long term (current) use of aspirin: Secondary | ICD-10-CM | POA: Insufficient documentation

## 2012-12-04 DIAGNOSIS — I428 Other cardiomyopathies: Secondary | ICD-10-CM | POA: Insufficient documentation

## 2012-12-04 DIAGNOSIS — I5020 Unspecified systolic (congestive) heart failure: Secondary | ICD-10-CM | POA: Insufficient documentation

## 2012-12-04 DIAGNOSIS — D571 Sickle-cell disease without crisis: Secondary | ICD-10-CM | POA: Insufficient documentation

## 2012-12-04 DIAGNOSIS — I5022 Chronic systolic (congestive) heart failure: Secondary | ICD-10-CM

## 2012-12-04 DIAGNOSIS — H548 Legal blindness, as defined in USA: Secondary | ICD-10-CM | POA: Insufficient documentation

## 2012-12-04 MED ORDER — PREDNISONE (PAK) 10 MG PO TABS: 40.0000 mg | ORAL_TABLET | Freq: Every day | ORAL | Status: DC

## 2012-12-04 MED ORDER — ALLOPURINOL 100 MG PO TABS: 100.0000 mg | ORAL_TABLET | Freq: Every day | ORAL | Status: DC

## 2012-12-04 NOTE — Patient Instructions (Addendum)
If your weight is 152 pounds or greater take and extra 20 mg of lasix which would be a total of 80 mg of lasix  Take prednisone 40 mg daily for 3 days  Take allopurinol 100 mg daily  Follow up in 3-4 weeks  Do the following things EVERYDAY: 1) Weigh yourself in the morning before breakfast. Write it down and keep it in a log. 2) Take your medicines as prescribed 3) Eat low salt foods-Limit salt (sodium) to 2000 mg per day.  4) Stay as active as you can everyday 5) Limit all fluids for the day to less than 2 liters

## 2012-12-04 NOTE — Assessment & Plan Note (Signed)
Check BMET. He will remain off spiro and lisinopril.

## 2012-12-04 NOTE — Progress Notes (Signed)
Patient ID: Patrick [name], male   DOB: 06/04/1956, 57 y.o.   MRN: [id number]    Weight Range    145-148 pounds  Baseline proBNP   993.1 on 01/19/12  PCP: [name] : Dr Marlou Sa Opthalmologist: [name]  Guide IT Trial   HPI: Patrick [name] is a 57 y.o. African American male with a history of  sickle cell disease with anemia, NICM, EF 99991111 and systolic heart failure diagnosed 05/2011, no CAD by cath 05/2011, CKD stage III, and tobacco abuse.   Admitted in 12/12 with new onset HF. Diuresed and improved. 05/2011: EF 30-35% range (gloabl HK). RV normal function. Mod LAE. RHC after diuresis.  RA mean 2  RV 24/3  PA 22/6  PCWP mean 8  LV 96/7  AO 100/64  Oxygen saturations:  PA 78%  AO 99%  Cardiac Output (Fick) 10.4  Cardiac Index (Fick) 5.7   Admitted to [facility] on 10/17/2011  with recurrent HF. Diuresed approximately 20 pounds. D/C weight 138 pounds. An echo was repeated on 10/17/11 showed LVEF 30-35%, diffuse hypokinesis, diastolic dysfunction, moderately dilated LA, mildly dilated RA, RV normal function and PA systolic pressure [id number] mmHg.   01/17/12 ECHO 55%-60%  11/20/12 ECHO EF  40% Peak PA pressure 56  Readmitted 11/18/12 from HF clinic with voume overload. Hemoglobin 6.7 Transfused 2 UPRBC. Diuresed with IV lasix. Lisinopril stopped. Discharge weight 143 pounds.   He returns for follow up today. Complaining of R knee pain.  Denies SOB/PND/Orthopnea. Weight at home 148-152  pounds. Compliant with medications.    He is not on Sprironolactone due to hyperkalemia. He is not on ace inhibitor due to renal failure.   He is legally blind.   ROS: All systems negative except as listed in HPI, PMH and Problem List.  Past Medical History  Diagnosis Date  . Swelling of extremity, right   . Swelling abdomen   . Swelling of extremity, left   . CHF (congestive heart failure)   . Blood transfusion     "today is the second time I've had a blood transfusion" (11/21/2012)  . Sickle  cell anemia   . Blood dyscrasia   . Exertional shortness of breath   . Legally blind     Current Outpatient Prescriptions  Medication Sig Dispense Refill  . aspirin 81 MG tablet Take 81 mg by mouth daily.      . carvedilol (COREG) 25 MG tablet Take 1 tablet (25 mg total) by mouth 2 (two) times daily with a meal.  60 tablet  6  . cholecalciferol (VITAMIN D) 1000 UNITS tablet Take 1,000 Units by mouth daily.      . furosemide (LASIX) 40 MG tablet Take 1.5 tablets (60 mg total) by mouth daily.  40 tablet  3   No current facility-administered medications for this encounter.     PHYSICAL EXAM: Filed Vitals:   12/04/12 0940  BP: 110/62  Pulse: 68  Height: 5\' 10"  (1.778 m)  Weight: 147 lb (66.679 kg)  SpO2: 100%    General:  Chronically ill.  No resp difficulty HEENT: normal except for mild scleral icterus Neck: supple. JVP 5-6 +prominent CVV waves. Carotids 2+ bilaterally; no bruits. No lymphadenopathy or thryomegaly appreciated. Cor: PMI normal Regular rate & rhythm. No rubs, gallops 2/6 TR and 2/6 MR Lungs: clear    Abdomen: soft, nontender, + distention. No hepatosplenomegaly. No bruits or masses. Good bowel sounds. Extremities: no cyanosis, rash, edema ,  + clubbing. Neuro:  alert & orientedx3, cranial nerves grossly intact. Moves all 4 extremities w/o difficulty. Affect pleasant.   ASSESSMENT & PLAN:

## 2012-12-04 NOTE — Assessment & Plan Note (Signed)
NYHA II. Volume status stable. Continue current diuretic regimen. He is instructed to take an additional 20 mg of lasix if his weight is 152 pounds or greater. He is not on spironolactone or an ace inhibitor due to chronic renal failure and hyperkalemia. Consider adding hydralazine/imdur at next visit. Check labs today. Follow up 3-4 weeks.

## 2012-12-15 ENCOUNTER — Encounter: Payer: Self-pay | Admitting: Internal Medicine

## 2012-12-18 ENCOUNTER — Ambulatory Visit: Payer: Medicaid Other | Admitting: Internal Medicine

## 2012-12-23 ENCOUNTER — Encounter (HOSPITAL_COMMUNITY): Payer: Self-pay

## 2012-12-23 ENCOUNTER — Ambulatory Visit (HOSPITAL_COMMUNITY)
Admission: RE | Admit: 2012-12-23 | Discharge: 2012-12-23 | Disposition: A | Discharge status: Home / Self Care | Payer: Self-pay | Source: Ambulatory Visit | Attending: Internal Medicine | Admitting: Internal Medicine

## 2012-12-23 VITALS — BP 100/54 | HR 83 | Wt 149.1 lb

## 2012-12-23 DIAGNOSIS — I5022 Chronic systolic (congestive) heart failure: Secondary | ICD-10-CM | POA: Insufficient documentation

## 2012-12-23 DIAGNOSIS — IMO0002 Reserved for concepts with insufficient information to code with codable children: Secondary | ICD-10-CM | POA: Insufficient documentation

## 2012-12-23 DIAGNOSIS — Z79899 Other long term (current) drug therapy: Secondary | ICD-10-CM | POA: Insufficient documentation

## 2012-12-23 DIAGNOSIS — D571 Sickle-cell disease without crisis: Secondary | ICD-10-CM | POA: Insufficient documentation

## 2012-12-23 DIAGNOSIS — H548 Legal blindness, as defined in USA: Secondary | ICD-10-CM | POA: Insufficient documentation

## 2012-12-23 DIAGNOSIS — E875 Hyperkalemia: Secondary | ICD-10-CM | POA: Insufficient documentation

## 2012-12-23 DIAGNOSIS — F17200 Nicotine dependence, unspecified, uncomplicated: Secondary | ICD-10-CM | POA: Insufficient documentation

## 2012-12-23 DIAGNOSIS — N183 Chronic kidney disease, stage 3 unspecified: Secondary | ICD-10-CM | POA: Insufficient documentation

## 2012-12-23 NOTE — Patient Instructions (Addendum)
Follow up in 2 months  Do the following things EVERYDAY: 1) Weigh yourself in the morning before breakfast. Write it down and keep it in a log. 2) Take your medicines as prescribed 3) Eat low salt foods-Limit salt (sodium) to 2000 mg per day.  4) Stay as active as you can everyday 5) Limit all fluids for the day to less than 2 liters 

## 2012-12-23 NOTE — Progress Notes (Signed)
Patient ID: Patrick [name], male   DOB: December 14, 1955, 57 y.o.   MRN: [id number]    Weight Range    145-148 pounds  Baseline proBNP   993.1 on 01/19/12  PCP: [name] :  Opthalmologist: [name]  Guide [employer] Trial   HPI: Patrick [name] is a 57 y.o. African American male with a history of  sickle cell disease with anemia, NICM, EF 99991111 and systolic heart failure diagnosed 05/2011, no CAD by cath 05/2011, CKD stage III, legally blind, and tobacco abuse.   Admitted in 12/12 with new onset HF. Diuresed and improved. 05/2011: EF 30-35% range (gloabl HK). RV normal function. Mod LAE. RHC after diuresis.  RA mean 2  RV 24/3  PA 22/6  PCWP mean 8  LV 96/7  AO 100/64  Oxygen saturations:  PA 78%  AO 99%  Cardiac Output (Fick) 10.4  Cardiac Index (Fick) 5.7   Admitted to [facility] on 10/17/2011  with recurrent HF. Diuresed approximately 20 pounds. D/C weight 138 pounds. An echo was repeated on 10/17/11 showed LVEF 30-35%, diffuse hypokinesis, diastolic dysfunction, moderately dilated LA, mildly dilated RA, RV normal function and PA systolic pressure Q000111Q mmHg.   01/17/12 ECHO 55%-60%  11/20/12 ECHO EF  40% Peak PA pressure 56  Readmitted 11/18/12 from HF clinic with voume overload. Hemoglobin 6.7 Transfused 2 UPRBC. Diuresed with IV lasix. Lisinopril stopped. Discharge weight 143 pounds.   He returns for follow up today with his girlfriend. Denies SOB/PND/Orthopnea. Sleeps on 2 pillows out of habit.  Weight at home 150-152  Pounds. Appetite improved.  He has not required additional lasix. Compliant with medications.    He is not on Sprironolactone due to hyperkalemia. He is not on ace inhibitor due to renal failure.     ROS: All systems negative except as listed in HPI, PMH and Problem List.  Past Medical History  Diagnosis Date  . Swelling of extremity, right   . Swelling abdomen   . Swelling of extremity, left   . CHF (congestive heart failure)   . Blood transfusion     "today is  the second time I've had a blood transfusion" (11/21/2012)  . Sickle cell anemia   . Blood dyscrasia   . Exertional shortness of breath   . Legally blind     Current Outpatient Prescriptions  Medication Sig Dispense Refill  . allopurinol (ZYLOPRIM) 100 MG tablet Take 1 tablet (100 mg total) by mouth daily.  30 tablet  12  . aspirin 81 MG tablet Take 81 mg by mouth daily.      . carvedilol (COREG) 25 MG tablet Take 1 tablet (25 mg total) by mouth 2 (two) times daily with a meal.  60 tablet  6  . cholecalciferol (VITAMIN D) 1000 UNITS tablet Take 1,000 Units by mouth daily.      . furosemide (LASIX) 40 MG tablet Take 1.5 tablets (60 mg total) by mouth daily.  40 tablet  3  . predniSONE (STERAPRED UNI-PAK) 10 MG tablet Take 4 tablets (40 mg total) by mouth daily. Take 40 mg for the next 3 days  12 tablet  0   No current facility-administered medications for this encounter.     PHYSICAL EXAM: Filed Vitals:   12/23/12 1344  BP: 100/54  Pulse: 83  Weight: 149 lb 1.9 oz (67.64 kg)  SpO2: 96%    General:  Well appearing.  No resp difficulty HEENT: normal except for mild scleral icterus Neck: supple. JVP  5-6 +prominent CV waves. Carotids 2+ bilaterally; no bruits. No lymphadenopathy or thryomegaly appreciated. Cor: PMI normal Regular rate & rhythm. No rubs, gallops 2/6 TR and 2/6 MR Lungs: clear    Abdomen: soft, nontender, nondistented. No hepatosplenomegaly. No bruits or masses. Good bowel sounds. Extremities: no cyanosis, rash, edema ,  + clubbing. Neuro: alert & orientedx3, cranial nerves grossly intact. Moves all 4 extremities w/o difficulty. Affect pleasant.   ASSESSMENT & PLAN:

## 2012-12-23 NOTE — Assessment & Plan Note (Signed)
NYHA I. Volume status stable. Continue current diuretic regimen. On goal dose for beta blocker. He is not on Sprironolactone due to hyperkalemia. He is not on ace inhibitor due to renal failure. reinforced daily weights, low slat food choices, and limiting fluid intake to < 2 liters per day. Follow up in 2 months and consider scheduling ECHO at that time.

## 2013-01-27 ENCOUNTER — Encounter (HOSPITAL_COMMUNITY): Payer: Medicaid Other

## 2013-02-03 ENCOUNTER — Encounter (HOSPITAL_COMMUNITY): Payer: Self-pay

## 2013-02-03 ENCOUNTER — Ambulatory Visit (HOSPITAL_COMMUNITY)
Admission: RE | Admit: 2013-02-03 | Discharge: 2013-02-03 | Disposition: A | Discharge status: Home / Self Care | Payer: Medicaid Other | Source: Ambulatory Visit | Attending: Internal Medicine | Admitting: Internal Medicine

## 2013-02-03 VITALS — BP 120/62 | HR 76 | Wt 152.0 lb

## 2013-02-03 DIAGNOSIS — Z7982 Long term (current) use of aspirin: Secondary | ICD-10-CM | POA: Insufficient documentation

## 2013-02-03 DIAGNOSIS — I5022 Chronic systolic (congestive) heart failure: Secondary | ICD-10-CM

## 2013-02-03 DIAGNOSIS — I509 Heart failure, unspecified: Secondary | ICD-10-CM | POA: Insufficient documentation

## 2013-02-03 DIAGNOSIS — H548 Legal blindness, as defined in USA: Secondary | ICD-10-CM | POA: Insufficient documentation

## 2013-02-03 DIAGNOSIS — D571 Sickle-cell disease without crisis: Secondary | ICD-10-CM

## 2013-02-03 DIAGNOSIS — Z79899 Other long term (current) drug therapy: Secondary | ICD-10-CM | POA: Insufficient documentation

## 2013-02-03 DIAGNOSIS — M109 Gout, unspecified: Secondary | ICD-10-CM

## 2013-02-03 MED ORDER — ISOSORBIDE MONONITRATE ER 30 MG PO TB24: 30.0000 mg | ORAL_TABLET | Freq: Every day | ORAL | Status: DC

## 2013-02-03 MED ORDER — HYDRALAZINE HCL 25 MG PO TABS: 12.5000 mg | ORAL_TABLET | Freq: Three times a day (TID) | ORAL | Status: DC

## 2013-02-03 NOTE — Progress Notes (Signed)
Patient ID: Patrick [name], male   DOB: 1955-11-16, 57 y.o.   MRN: [id number]    Weight Range    145-148 pounds  Baseline proBNP   993.1 on 01/19/12  PCP: [name] : None Opthalmologist: [name]  [employer] Trial   HPI: Patrick [name] is a 57 y.o. African American male with a history of  sickle cell disease with anemia, NICM, EF 99991111 and systolic heart failure diagnosed 05/2011, no CAD by cath 05/2011, CKD stage III, legally blind, and tobacco abuse.   Admitted in 12/12 with new onset HF. Diuresed and improved. 05/2011: EF 30-35% range (gloabl HK). RV normal function. Mod LAE. RHC after diuresis.  RA mean 2  RV 24/3  PA 22/6  PCWP mean 8  LV 96/7  AO 100/64  Oxygen saturations:  PA 78%  AO 99%  Cardiac Output (Fick) 10.4  Cardiac Index (Fick) 5.7   Admitted to [facility] on 10/17/2011  with recurrent HF. Diuresed approximately 20 pounds. D/C weight 138 pounds. An echo was repeated on 10/17/11 showed LVEF 30-35%, diffuse hypokinesis, diastolic dysfunction, moderately dilated LA, mildly dilated RA, RV normal function and PA systolic pressure Q000111Q mmHg.   01/17/12 ECHO 55%-60%  11/20/12 ECHO EF  40% Peak PA pressure 56  Readmitted 11/18/12 from HF clinic with voume overload. Hemoglobin 6.7 Transfused 2 UPRBC. Diuresed with IV lasix. Lisinopril stopped. Discharge weight 143 pounds.    He returns for follow up today with his girlfriend. Denies SOB/PND/Orthopnea. Sleeps on 2 pillows out of habit.  Able to walk up steps. No change in vision. Weight at home 153 pounds. He has not required any additional lasix. Appetite improved.  Compliant with medications.    He is not on Sprironolactone due to hyperkalemia. He is not on ace inhibitor due to renal failure.     ROS: All systems negative except as listed in HPI, PMH and Problem List.  Past Medical History  Diagnosis Date  . Swelling of extremity, right   . Swelling abdomen   . Swelling of extremity, left   . CHF (congestive heart  failure)   . Blood transfusion     "today is the second time I've had a blood transfusion" (11/21/2012)  . Sickle cell anemia   . Blood dyscrasia   . Exertional shortness of breath   . Legally blind     Current Outpatient Prescriptions  Medication Sig Dispense Refill  . allopurinol (ZYLOPRIM) 100 MG tablet Take 1 tablet (100 mg total) by mouth daily.  30 tablet  12  . aspirin 81 MG tablet Take 81 mg by mouth daily.      . carvedilol (COREG) 25 MG tablet Take 1 tablet (25 mg total) by mouth 2 (two) times daily with a meal.  60 tablet  6  . cholecalciferol (VITAMIN D) 1000 UNITS tablet Take 1,000 Units by mouth daily.      . furosemide (LASIX) 40 MG tablet Take 1.5 tablets (60 mg total) by mouth daily.  40 tablet  3   No current facility-administered medications for this encounter.     PHYSICAL EXAM: Filed Vitals:   02/03/13 1059  BP: 120/62  Pulse: 76  Weight: 152 lb (68.947 kg)  SpO2: 96%    General:  Well appearing.  No resp difficulty HEENT: normal except for mild scleral icterus Neck: supple. JVP 5-6 +prominent CV waves. Carotids 2+ bilaterally; no bruits. No lymphadenopathy or thryomegaly appreciated. Cor: PMI normal Regular rate & rhythm. No  rubs, gallops 2/6 TR and 2/6 MR Lungs: clear    Abdomen: soft, nontender, nondistented. No hepatosplenomegaly. No bruits or masses. Good bowel sounds. Extremities: no cyanosis, rash, edema ,  + clubbing. Neuro: alert & orientedx3, cranial nerves grossly intact. Moves all 4 extremities w/o difficulty. Affect pleasant.   ASSESSMENT & PLAN: 1. Chronic Systolic Heart Failure -NICM  Cath 05/2011 ECHO 11/20/12 EF 40% (previous 55% 01/2012). NYHA I.  Volume status stable despite weight gain. Continue lasix 60 mg daily On goal dose of carvedilol 25 mg twice a day Add hydralazine 12.5 mg tid and Imdur 30 mg dialy He is not on Ace/Arb/spiro due to hyperkalemia/ CKD Reinforced daily weights, low salt food choices, and limiting fluid intake to ,  2 liters perday Will need repeat ECHO in a few months.  Check BMET/Pro BNP today  2. Sickle Cell Anemia Instructed to set up appointment [facility]  3. Gout He has gout flares off medicine. Continue allopurinol 100 mg daily  Follow up in 3 weeks

## 2013-02-03 NOTE — Patient Instructions (Addendum)
Take hydralazine 12.5 mg three times a day  Take Imdur 30 mg daily.    Do the following things EVERYDAY: 1) Weigh yourself in the morning before breakfast. Write it down and keep it in a log. 2) Take your medicines as prescribed 3) Eat low salt foods-Limit salt (sodium) to 2000 mg per day.  4) Stay as active as you can everyday 5) Limit all fluids for the day to less than 2 liters  Follow up 2-3 weeks.

## 2013-02-07 ENCOUNTER — Inpatient Hospital Stay (HOSPITAL_COMMUNITY): Payer: Medicaid Other

## 2013-02-07 ENCOUNTER — Inpatient Hospital Stay (HOSPITAL_COMMUNITY)
Admission: EM | Admit: 2013-02-07 | Discharge: 2013-02-12 | DRG: 391 | Disposition: A | Discharge status: Home / Self Care | Payer: Medicaid Other | Attending: Internal Medicine | Admitting: Internal Medicine

## 2013-02-07 ENCOUNTER — Encounter (HOSPITAL_COMMUNITY): Payer: Self-pay | Admitting: Nurse Practitioner

## 2013-02-07 ENCOUNTER — Emergency Department (HOSPITAL_COMMUNITY): Payer: Medicaid Other

## 2013-02-07 DIAGNOSIS — R7989 Other specified abnormal findings of blood chemistry: Secondary | ICD-10-CM | POA: Diagnosis present

## 2013-02-07 DIAGNOSIS — R55 Syncope and collapse: Secondary | ICD-10-CM | POA: Diagnosis present

## 2013-02-07 DIAGNOSIS — D571 Sickle-cell disease without crisis: Secondary | ICD-10-CM

## 2013-02-07 DIAGNOSIS — F17200 Nicotine dependence, unspecified, uncomplicated: Secondary | ICD-10-CM | POA: Diagnosis present

## 2013-02-07 DIAGNOSIS — M109 Gout, unspecified: Secondary | ICD-10-CM | POA: Diagnosis present

## 2013-02-07 DIAGNOSIS — D72829 Elevated white blood cell count, unspecified: Secondary | ICD-10-CM | POA: Diagnosis present

## 2013-02-07 DIAGNOSIS — Z7982 Long term (current) use of aspirin: Secondary | ICD-10-CM

## 2013-02-07 DIAGNOSIS — I509 Heart failure, unspecified: Secondary | ICD-10-CM

## 2013-02-07 DIAGNOSIS — R112 Nausea with vomiting, unspecified: Secondary | ICD-10-CM

## 2013-02-07 DIAGNOSIS — I129 Hypertensive chronic kidney disease with stage 1 through stage 4 chronic kidney disease, or unspecified chronic kidney disease: Secondary | ICD-10-CM | POA: Diagnosis present

## 2013-02-07 DIAGNOSIS — N183 Chronic kidney disease, stage 3 unspecified: Secondary | ICD-10-CM

## 2013-02-07 DIAGNOSIS — K529 Noninfective gastroenteritis and colitis, unspecified: Secondary | ICD-10-CM

## 2013-02-07 DIAGNOSIS — K859 Acute pancreatitis without necrosis or infection, unspecified: Secondary | ICD-10-CM | POA: Diagnosis present

## 2013-02-07 DIAGNOSIS — R197 Diarrhea, unspecified: Secondary | ICD-10-CM | POA: Diagnosis present

## 2013-02-07 DIAGNOSIS — I5032 Chronic diastolic (congestive) heart failure: Secondary | ICD-10-CM

## 2013-02-07 DIAGNOSIS — A088 Other specified intestinal infections: Principal | ICD-10-CM | POA: Diagnosis present

## 2013-02-07 DIAGNOSIS — N390 Urinary tract infection, site not specified: Secondary | ICD-10-CM

## 2013-02-07 DIAGNOSIS — H548 Legal blindness, as defined in USA: Secondary | ICD-10-CM | POA: Diagnosis present

## 2013-02-07 DIAGNOSIS — K8020 Calculus of gallbladder without cholecystitis without obstruction: Secondary | ICD-10-CM | POA: Diagnosis present

## 2013-02-07 DIAGNOSIS — I5022 Chronic systolic (congestive) heart failure: Secondary | ICD-10-CM | POA: Diagnosis present

## 2013-02-07 DIAGNOSIS — N179 Acute kidney failure, unspecified: Secondary | ICD-10-CM | POA: Diagnosis present

## 2013-02-07 DIAGNOSIS — I10 Essential (primary) hypertension: Secondary | ICD-10-CM

## 2013-02-07 LAB — CBC WITH DIFFERENTIAL/PLATELET
Basophils Absolute: 0.1 10*3/uL (ref 0.0–0.1)
Basophils Relative: 1 % (ref 0–1)
Eosinophils Absolute: 0.6 10*3/uL (ref 0.0–0.7)
Eosinophils Relative: 4 % (ref 0–5)
HCT: 18.2 % — ABNORMAL LOW (ref 39.0–52.0)
Hemoglobin: 6.7 g/dL — CL (ref 13.0–17.0)
Lymphocytes Relative: 11 % — ABNORMAL LOW (ref 12–46)
Lymphs Abs: 1.6 10*3/uL (ref 0.7–4.0)
MCH: 31.3 pg (ref 26.0–34.0)
MCHC: 36.8 g/dL — ABNORMAL HIGH (ref 30.0–36.0)
MCV: 85 fL (ref 78.0–100.0)
Monocytes Absolute: 2.1 10*3/uL — ABNORMAL HIGH (ref 0.1–1.0)
Monocytes Relative: 14 % — ABNORMAL HIGH (ref 3–12)
Neutro Abs: 10.5 10*3/uL — ABNORMAL HIGH (ref 1.7–7.7)
Neutrophils Relative %: 70 % (ref 43–77)
Platelets: 196 10*3/uL (ref 150–400)
RBC: 2.14 MIL/uL — ABNORMAL LOW (ref 4.22–5.81)
RDW: 23.3 % — ABNORMAL HIGH (ref 11.5–15.5)
WBC: 14.9 10*3/uL — ABNORMAL HIGH (ref 4.0–10.5)

## 2013-02-07 LAB — COMPREHENSIVE METABOLIC PANEL
ALT: 11 U/L (ref 0–53)
AST: 41 U/L — ABNORMAL HIGH (ref 0–37)
Albumin: 3.4 g/dL — ABNORMAL LOW (ref 3.5–5.2)
Alkaline Phosphatase: 78 U/L (ref 39–117)
BUN: 54 mg/dL — ABNORMAL HIGH (ref 6–23)
CO2: 18 mEq/L — ABNORMAL LOW (ref 19–32)
Calcium: 8.6 mg/dL (ref 8.4–10.5)
Chloride: 106 mEq/L (ref 96–112)
Creatinine, Ser: 2.62 mg/dL — ABNORMAL HIGH (ref 0.50–1.35)
GFR calc Af Amer: 30 mL/min — ABNORMAL LOW (ref >90)
GFR calc non Af Amer: 26 mL/min — ABNORMAL LOW (ref >90)
Glucose, Bld: 104 mg/dL — ABNORMAL HIGH (ref 70–99)
Potassium: 4.4 mEq/L (ref 3.5–5.1)
Sodium: 137 mEq/L (ref 135–145)
Total Bilirubin: 3 mg/dL — ABNORMAL HIGH (ref 0.3–1.2)
Total Protein: 6.6 g/dL (ref 6.0–8.3)

## 2013-02-07 LAB — CG4 I-STAT (LACTIC ACID): Lactic Acid, Venous: 0.89 mmol/L (ref 0.5–2.2)

## 2013-02-07 LAB — LIPASE, BLOOD: Lipase: 89 U/L — ABNORMAL HIGH (ref 11–59)

## 2013-02-07 LAB — PREPARE RBC (CROSSMATCH)

## 2013-02-07 MED ORDER — SODIUM CHLORIDE 0.45 % IV SOLN
INTRAVENOUS | Status: DC
  Administered 2013-02-07: 1000 mL via INTRAVENOUS
  Administered 2013-02-08 – 2013-02-10 (×3): via INTRAVENOUS
  Administered 2013-02-10: 20 mL via INTRAVENOUS

## 2013-02-07 MED ORDER — CARVEDILOL 25 MG PO TABS
25.0000 mg | ORAL_TABLET | Freq: Two times a day (BID) | ORAL | Status: DC
  Administered 2013-02-07 – 2013-02-12 (×9): 25 mg via ORAL
  Filled 2013-02-07 (×12): qty 1

## 2013-02-07 MED ORDER — ACETAMINOPHEN 325 MG PO TABS
650.0000 mg | ORAL_TABLET | Freq: Four times a day (QID) | ORAL | Status: DC | PRN
  Administered 2013-02-10 – 2013-02-11 (×3): 650 mg via ORAL
  Filled 2013-02-07 (×3): qty 2

## 2013-02-07 MED ORDER — ONDANSETRON HCL 4 MG PO TABS: 4.0000 mg | ORAL_TABLET | Freq: Four times a day (QID) | ORAL | Status: DC | PRN

## 2013-02-07 MED ORDER — ASPIRIN 81 MG PO TABS: 81.0000 mg | ORAL_TABLET | Freq: Every day | ORAL | Status: DC

## 2013-02-07 MED ORDER — ACETAMINOPHEN 650 MG RE SUPP: 650.0000 mg | Freq: Four times a day (QID) | RECTAL | Status: DC | PRN

## 2013-02-07 MED ORDER — SODIUM CHLORIDE 0.9 % IV SOLN
INTRAVENOUS | Status: DC
  Administered 2013-02-07: 21:00:00 via INTRAVENOUS

## 2013-02-07 MED ORDER — SODIUM CHLORIDE 0.9 % IJ SOLN
3.0000 mL | Freq: Two times a day (BID) | INTRAMUSCULAR | Status: DC
  Administered 2013-02-08 – 2013-02-11 (×6): 3 mL via INTRAVENOUS

## 2013-02-07 MED ORDER — ONDANSETRON HCL 4 MG/2ML IJ SOLN: 4.0000 mg | Freq: Four times a day (QID) | INTRAMUSCULAR | Status: DC | PRN

## 2013-02-07 MED ORDER — HYDROMORPHONE HCL PF 1 MG/ML IJ SOLN
INTRAMUSCULAR | Status: AC
  Administered 2013-02-07: 1 mg
  Filled 2013-02-07: qty 1

## 2013-02-07 MED ORDER — HYDROMORPHONE HCL PF 1 MG/ML IJ SOLN
1.0000 mg | INTRAMUSCULAR | Status: AC
  Administered 2013-02-07: 1 mg via INTRAVENOUS
  Filled 2013-02-07: qty 1

## 2013-02-07 MED ORDER — HYDRALAZINE HCL 25 MG PO TABS
12.5000 mg | ORAL_TABLET | Freq: Three times a day (TID) | ORAL | Status: DC
  Administered 2013-02-07 – 2013-02-12 (×14): 12.5 mg via ORAL
  Filled 2013-02-07 (×16): qty 0.5

## 2013-02-07 MED ORDER — PANTOPRAZOLE SODIUM 40 MG IV SOLR
40.0000 mg | Freq: Every day | INTRAVENOUS | Status: DC
  Administered 2013-02-07 – 2013-02-10 (×4): 40 mg via INTRAVENOUS
  Filled 2013-02-07 (×5): qty 40

## 2013-02-07 MED ORDER — IOHEXOL 300 MG/ML  SOLN
25.0000 mL | INTRAMUSCULAR | Status: DC
  Administered 2013-02-07: 25 mL via ORAL

## 2013-02-07 MED ORDER — ENOXAPARIN SODIUM 30 MG/0.3ML ~~LOC~~ SOLN
30.0000 mg | SUBCUTANEOUS | Status: DC
  Administered 2013-02-07 – 2013-02-08 (×2): 30 mg via SUBCUTANEOUS
  Filled 2013-02-07 (×4): qty 0.3

## 2013-02-07 MED ORDER — HYDROMORPHONE HCL PF 1 MG/ML IJ SOLN
1.0000 mg | INTRAMUSCULAR | Status: DC | PRN
  Administered 2013-02-08 – 2013-02-11 (×9): 1 mg via INTRAVENOUS
  Filled 2013-02-07 (×9): qty 1

## 2013-02-07 MED ORDER — ISOSORBIDE MONONITRATE ER 30 MG PO TB24
30.0000 mg | ORAL_TABLET | Freq: Every day | ORAL | Status: DC
  Administered 2013-02-08 – 2013-02-12 (×5): 30 mg via ORAL
  Filled 2013-02-07 (×5): qty 1

## 2013-02-07 MED ORDER — ASPIRIN 81 MG PO CHEW
81.0000 mg | CHEWABLE_TABLET | Freq: Every day | ORAL | Status: DC
  Administered 2013-02-07 – 2013-02-12 (×6): 81 mg via ORAL
  Filled 2013-02-07 (×6): qty 1

## 2013-02-07 MED ORDER — ONDANSETRON HCL 4 MG/2ML IJ SOLN
4.0000 mg | Freq: Once | INTRAMUSCULAR | Status: AC
  Administered 2013-02-07: 4 mg via INTRAVENOUS
  Filled 2013-02-07: qty 2

## 2013-02-07 MED ORDER — SODIUM CHLORIDE 0.9 % IV SOLN
INTRAVENOUS | Status: DC
  Administered 2013-02-07: 15:00:00 via INTRAVENOUS

## 2013-02-07 MED ORDER — ALLOPURINOL 100 MG PO TABS
100.0000 mg | ORAL_TABLET | Freq: Every day | ORAL | Status: DC
  Administered 2013-02-08 – 2013-02-12 (×5): 100 mg via ORAL
  Filled 2013-02-07 (×5): qty 1

## 2013-02-07 NOTE — H&P (Addendum)
[facility] History and Physical  Patrick [name] [id number] DOB: 12/10/1955 DOA: 02/07/2013  Referring physician: Dr. Zenia Resides PCP: Marlou Sa ERIC, MD  Specialists:   Chief Complaint: Lower abdominal pain with nausea vomiting and diarrhea.  HPI: Patrick [name] is a 57 y.o. male sickle cell disease, CHF, CKD(last creatinine 2.1 in June 2014) who presents with above complaints as well as generalized weakness. He states that he was in his usual state of health until today when he began having some lower abdominal pain associated with nausea vomiting and diarrhea. She describes the abdominal pain as- severe, 10 out of 10 in intensity, constant, was relieved by IV narcotics in the ED. He states he had diarrhea x4 initially just loose and then became watery, nonbloody. He admits to multiple episodes of nonbloody emesis. He denies fevers, dysuria, cough and no chest pain. He also denies any sick contacts and also denies eating any suspicious foods. He was seen in the ED and CT scan of his abdomen and pelvis done which revealed cholelithiasis and fluid filled distal colon consistent with history of diarrhea. His LFTs/bilirubin is mildly elevated but about the same as in June of 2014. His lipase 89. UA ordered in ED but not collected. WBC 14.9, Hgb 6.7 (he states his hemoglobin usually runs about 8). He is admitted for further evaluation and management.  He denies any back leg or chest pain like he usually has with his sickle cell crisis.  Review of Systems: The patient denies, fever, weight loss,, vision loss, decreased hearing, hoarseness, chest pain, syncope, dyspnea on exertion, peripheral edema, balance deficits, hemoptysis, melena, hematochezia, severe indigestion/heartburn, hematuria, incontinence, muscle weakness, suspicious skin lesions, transient blindness, difficulty walking, depression, unusual weight change, abnormal bleeding, enlarged lymph nodes  Past Medical History  Diagnosis Date   . Swelling of extremity, right   . Swelling abdomen   . Swelling of extremity, left   . CHF (congestive heart failure)   . Blood transfusion     "today is the second time I've had a blood transfusion" (11/21/2012)  . Sickle cell anemia   . Blood dyscrasia   . Exertional shortness of breath   . Legally blind    Past Surgical History  Procedure Laterality Date  . No past surgeries     Social History:  reports that he has been smoking Cigarettes.  He has a 10 pack-year smoking history. He has never used smokeless tobacco. He reports that  drinks alcohol. He reports that he does not use illicit drugs. where does patient live--home   No Known Allergies  Family History  Problem Relation Age of Onset  . Alcohol abuse Mother   . Liver disease Mother   . Cirrhosis Mother     Prior to Admission medications   Medication Sig Start Date End Date Taking? Authorizing Provider  allopurinol (ZYLOPRIM) 100 MG tablet Take 1 tablet (100 mg total) by mouth daily. 12/04/12 12/04/13 Yes Amy D [name], NP  aspirin 81 MG tablet Take 81 mg by mouth daily.   Yes Historical Provider, MD  carvedilol (COREG) 25 MG tablet Take 1 tablet (25 mg total) by mouth 2 (two) times daily with a meal. 05/28/12  Yes Amy D [name], NP  cholecalciferol (VITAMIN D) 1000 UNITS tablet Take 1,000 Units by mouth daily.   Yes Historical Provider, MD  furosemide (LASIX) 40 MG tablet Take 1.5 tablets (60 mg total) by mouth daily. 11/23/12  Yes Liliane Shi, PA-C  hydrALAZINE (APRESOLINE) 25 MG tablet  Take 0.5 tablets (12.5 mg total) by mouth 3 (three) times daily. 02/03/13  Yes Amy D [name], NP  isosorbide mononitrate (IMDUR) 30 MG 24 hr tablet Take 1 tablet (30 mg total) by mouth daily. 02/03/13  Yes Amy Estrella Deeds, NP   Physical Exam: Filed Vitals:   02/07/13 1944  BP: 143/83  Pulse: 89  Temp: 97.5 F (36.4 C)  Resp: 12    Constitutional: Vital signs reviewed.  Patient is a well-developed and well-nourished  in no acute distress  and cooperative with exam. Alert and oriented x3.  Head: Normocephalic and atraumatic Nose: No erythema or drainage noted.  Turbinates normal Mouth: no erythema or exudates, slightly dry mm Eyes: PERRL, EOMI, conjunctivae normal, No scleral icterus.  Neck: Supple, Trachea midline normal ROM, No JVD, mass, thyromegaly, or carotid bruit present.  Cardiovascular: RRR, S1 normal, S2 normal, no MRG, pulses symmetric and intact bilaterally Pulmonary/Chest: normal respiratory effort, CTAB, no wheezes, rales, or rhonchi Abdominal: Soft. Non-tender (status post Dilaudid), non-distended, bowel sounds are normal, no masses, organomegaly, or guarding present.  GU: no CVA tenderness  extremities: No cyanosis and no edema  Neurological: A&O x3, Strength is normal and symmetric bilaterally, cranial nerve II-XII are grossly intact, no focal motor deficit, sensory intact to light touch bilaterally.  Skin: Warm, dry and intact. No rash, cyanosis, or clubbing.  Psychiatric: Normal mood and affect. speech and behavior is normal. Judgment and thought content normal. Cognition and memory are normal.     Labs on Admission:  Basic Metabolic Panel:  Recent Labs Lab 02/07/13 1530  NA 137  K 4.4  CL 106  CO2 18*  GLUCOSE 104*  BUN 54*  CREATININE 2.62*  CALCIUM 8.6   Liver Function Tests:  Recent Labs Lab 02/07/13 1530  AST 41*  ALT 11  ALKPHOS 78  BILITOT 3.0*  PROT 6.6  ALBUMIN 3.4*    Recent Labs Lab 02/07/13 1530  LIPASE 89*   No results found for this basename: AMMONIA,  in the last 168 hours CBC:  Recent Labs Lab 02/07/13 1530  WBC 14.9*  NEUTROABS 10.5*  HGB 6.7*  HCT 18.2*  MCV 85.0  PLT 196   Cardiac Enzymes: No results found for this basename: CKTOTAL, CKMB, CKMBINDEX, TROPONINI,  in the last 168 hours  BNP (last 3 results)  Recent Labs  11/20/12 1737  PROBNP 6715.0*   CBG: No results found for this basename: GLUCAP,  in the last 168 hours  Radiological  Exams on Admission: Ct Abdomen Pelvis Wo Contrast  02/07/2013   *RADIOLOGY REPORT*  Clinical Data: Nausea, vomiting, diarrhea, syncope, left lower quadrant pain, history chronic diastolic heart failure, chronic kidney disease stage III, gout, sickle cell anemia  CT ABDOMEN AND PELVIS WITHOUT CONTRAST  Technique:  Multidetector CT imaging of the abdomen and pelvis was performed following the standard protocol without intravenous contrast. Sagittal and coronal MPR images reconstructed from axial data set.  Comparison: 06/08/2011  Findings: Lung bases clear. Enlargement of cardiac chambers. Low attenuation foci at left kidney likely small cysts. Calcified gallstones up to the 18 mm diameter. Liver, pancreas, kidneys, and adrenal glands otherwise normal appearance. Small calcified splenic remnant post auto infarction under left diaphragm.  Fluid-filled colon distally consistent with the area of diarrhea. Majority of the colon is unopacified, some areas demonstrating normal wall thickness with remaining areas limited in assessment of wall thickness. Stomach and bowel loops otherwise normal appearance. Normal appendix. No mass, adenopathy, free fluid inflammatory process. Unremarkable bladder  and ureters. Osseous changes of sickle cell disease.  IMPRESSION: Cholelithiasis. Enlargement of cardiac chambers. Fluid-filled distal colon consistent with history of diarrhea. Otherwise negative exam.   Original Report Authenticated By: Lavonia Dana, M.D.     Assessment/Plan Active Problems:  Nausea/vomiting/diarrhea, with lower abdominal pain-probable gastroenteritis  - will obtain stool studies/GI pathogen panel including a C. difficile PCR and stool culture -Supportive care, IVF antiemetics pain management -follow stool studies and further treat accordingly Elevated lipase/chemical Pancreatitis -His lipase is mildly elevated, but CT scan shows a normal pancreas and he denies having had any left upper quadrant pain and  he is nontender on exam(albeit following Dilaudid) -Will keep n.p.o. for now, cautiously hydrate given his history of CHF -Follow and recheck lipase in a -Hold lipase Lasix for now while he is n.p.o. and given diarrhea, but also can be a cause of pancreatitis   Cholelithiasis -He has no right upper quadrant tenderness on exam, and no significant change in his LFTs from June 2014 -Follow and consider inpatient versus outpatient surgery consult   Acute CKD (chronic kidney disease), stage III -Secondary to volume depletion -Gentle hydration as above, follow and recheck   Chronic systolic heart failure -Hold Lasix for now as discussed above, his compensated at this time the -Monitor fluid status closely with cautious hydration -Follow and resume Lasix when clinically appropriate, continue his other outpatient meds as BP tolerates. -He is followed by Dr. Haroldine Laws   Sickle cell anemia -hgb 6.7 today(usually about 8.), we'll transfuse 1 unit on recheck. -He has no evidence of pain crisis at this time . -He states he was followed by Dr. Marlou Sa in last saw him in February, he plans to go to the sickle cell center but has not yet seen Dr. Zigmund Daniel.    Leukocytosis, unspecified -Obtain urine and stool cultures as above, follow and further manage as appropriate   HTN (hypertension) -Continue Coreg and hydralazine, monitor and further treat accordingly History of gout -Continue allopurinol     Code Status: full Family Communication: Significant other/girlfriend at bedside Disposition Plan: admit to tele  Time spent: >30  [facility] Pager [phone]  If 7PM-7AM, please contact night-coverage [email or web address] Password [email or web address] 02/07/2013, 8:44 PM

## 2013-02-07 NOTE — ED Notes (Signed)
Dr. Zenia Resides made aware of pt. Lower abd pain.

## 2013-02-07 NOTE — ED Provider Notes (Signed)
CSN: [id number]     Arrival date & time 02/07/13  1447 History     First MD Initiated Contact with Patient 02/07/13 1452     Chief Complaint  Patient presents with  . Near Syncope   (Consider location/radiation/quality/duration/timing/severity/associated sxs/prior Treatment) The history is provided by the patient.   patient here with abdominal pain x4 hours characterized as sharp and located. Umbilicus the suprapubic with associated nonbilious vomiting and watery diarrhea. No fever or chills. No dysuria or hematuria. No prior history of same in his symptoms have been constant. Denies any recent illnesses. No bad food exposure. Called EMS and patient transported here Patient here with Past Medical History  Diagnosis Date  . Swelling of extremity, right   . Swelling abdomen   . Swelling of extremity, left   . CHF (congestive heart failure)   . Blood transfusion     "today is the second time I've had a blood transfusion" (11/21/2012)  . Sickle cell anemia   . Blood dyscrasia   . Exertional shortness of breath   . Legally blind    Past Surgical History  Procedure Laterality Date  . No past surgeries     Family History  Problem Relation Age of Onset  . Alcohol abuse Mother   . Liver disease Mother   . Cirrhosis Mother    History  Substance Use Topics  . Smoking status: Current Every Day Smoker -- 0.25 packs/day for 40 years    Types: Cigarettes  . Smokeless tobacco: Never Used  . Alcohol Use: 0.0 oz/week     Comment: 11/21/2012 "used to drink a beer q now and then; last beer ~ 3 months ago"    Review of Systems  All other systems reviewed and are negative.    Allergies  Review of patient's allergies indicates no known allergies.  Home Medications   Current Outpatient Rx  Name  Route  Sig  Dispense  Refill  . allopurinol (ZYLOPRIM) 100 MG tablet   Oral   Take 1 tablet (100 mg total) by mouth daily.   30 tablet   12   . aspirin 81 MG tablet   Oral   Take 81 mg by  mouth daily.         . carvedilol (COREG) 25 MG tablet   Oral   Take 1 tablet (25 mg total) by mouth 2 (two) times daily with a meal.   60 tablet   6   . cholecalciferol (VITAMIN D) 1000 UNITS tablet   Oral   Take 1,000 Units by mouth daily.         . furosemide (LASIX) 40 MG tablet   Oral   Take 1.5 tablets (60 mg total) by mouth daily.   40 tablet   3   . hydrALAZINE (APRESOLINE) 25 MG tablet   Oral   Take 0.5 tablets (12.5 mg total) by mouth 3 (three) times daily.   45 tablet   3   . isosorbide mononitrate (IMDUR) 30 MG 24 hr tablet   Oral   Take 1 tablet (30 mg total) by mouth daily.   30 tablet   3    BP 117/63  Temp(Src) 97.8 F (36.6 C)  Resp 18  Ht 5\' 10"  (1.778 m)  Wt 153 lb (69.4 kg)  BMI 21.95 kg/m2  SpO2 95% Physical Exam  Nursing note and vitals reviewed. Constitutional: He is oriented to person, place, and time. He appears well-developed and well-nourished.  Non-toxic appearance. No  distress.  HENT:  Head: Normocephalic and atraumatic.  Eyes: Conjunctivae, EOM and lids are normal. Pupils are equal, round, and reactive to light.  Neck: Normal range of motion. Neck supple. No tracheal deviation present. No mass present.  Cardiovascular: Normal rate, regular rhythm and normal heart sounds.  Exam reveals no gallop.   No murmur heard. Pulmonary/Chest: Effort normal and breath sounds normal. No stridor. No respiratory distress. He has no decreased breath sounds. He has no wheezes. He has no rhonchi. He has no rales.  Abdominal: Soft. Normal appearance and bowel sounds are normal. He exhibits no distension. There is tenderness in the periumbilical area and suprapubic area. There is no rigidity, no rebound, no guarding and no CVA tenderness.  Musculoskeletal: Normal range of motion. He exhibits no edema and no tenderness.  Neurological: He is alert and oriented to person, place, and time. He has normal strength. No cranial nerve deficit or sensory deficit.  GCS eye subscore is 4. GCS verbal subscore is 5. GCS motor subscore is 6.  Skin: Skin is warm and dry. No abrasion and no rash noted.  Psychiatric: He has a normal mood and affect. His speech is normal and behavior is normal.    ED Course   Procedures (including critical care time)  Labs Reviewed - No data to display No results found. No diagnosis found.  MDM  Patient given IV fluids and pain medication. Patient blood pressure has been stable here and likely had a vagal episode prior to arrival. Suspect the patient may have gallstone pancreatitis. Patient's hemoglobin is 6.7 noted and this is around his baseline. He denies any black or bloody stools. No hematemesis noted. Abdominal ultrasound is pending at this time. Have spoken with the hospitalist, Dr. Eusebio Me, and she will admit the patient  Leota Jacobsen, MD 02/07/13 1836

## 2013-02-07 NOTE — ED Notes (Signed)
Per EMS pt was in bathroom at [facility] with vomiting and diarrhea and lower left abdominal pain. EMS went to get pt to stretcher pt had episode of near syncope. Initial BP 80/palpated last BP 102/61

## 2013-02-08 DIAGNOSIS — K8020 Calculus of gallbladder without cholecystitis without obstruction: Secondary | ICD-10-CM

## 2013-02-08 DIAGNOSIS — R112 Nausea with vomiting, unspecified: Secondary | ICD-10-CM

## 2013-02-08 DIAGNOSIS — R197 Diarrhea, unspecified: Secondary | ICD-10-CM

## 2013-02-08 LAB — TYPE AND SCREEN
ABO/RH(D): AB POS
Antibody Screen: NEGATIVE
Unit division: 0

## 2013-02-08 LAB — CBC
HCT: 18.6 % — ABNORMAL LOW (ref 39.0–52.0)
Hemoglobin: 6.8 g/dL — CL (ref 13.0–17.0)
MCH: 30.4 pg (ref 26.0–34.0)
MCHC: 36.6 g/dL — ABNORMAL HIGH (ref 30.0–36.0)
MCV: 83 fL (ref 78.0–100.0)
Platelets: 191 10*3/uL (ref 150–400)
RBC: 2.24 MIL/uL — ABNORMAL LOW (ref 4.22–5.81)
RDW: 22.5 % — ABNORMAL HIGH (ref 11.5–15.5)
WBC: 16.3 10*3/uL — ABNORMAL HIGH (ref 4.0–10.5)

## 2013-02-08 LAB — COMPREHENSIVE METABOLIC PANEL
ALT: 11 U/L (ref 0–53)
AST: 37 U/L (ref 0–37)
Albumin: 3.1 g/dL — ABNORMAL LOW (ref 3.5–5.2)
Alkaline Phosphatase: 70 U/L (ref 39–117)
BUN: 55 mg/dL — ABNORMAL HIGH (ref 6–23)
CO2: 18 mEq/L — ABNORMAL LOW (ref 19–32)
Calcium: 8.5 mg/dL (ref 8.4–10.5)
Chloride: 105 mEq/L (ref 96–112)
Creatinine, Ser: 2.71 mg/dL — ABNORMAL HIGH (ref 0.50–1.35)
GFR calc Af Amer: 29 mL/min — ABNORMAL LOW (ref >90)
GFR calc non Af Amer: 25 mL/min — ABNORMAL LOW (ref >90)
Glucose, Bld: 83 mg/dL (ref 70–99)
Potassium: 4.5 mEq/L (ref 3.5–5.1)
Sodium: 135 mEq/L (ref 135–145)
Total Bilirubin: 2.1 mg/dL — ABNORMAL HIGH (ref 0.3–1.2)
Total Protein: 6.2 g/dL (ref 6.0–8.3)

## 2013-02-08 LAB — URINALYSIS, ROUTINE W REFLEX MICROSCOPIC
Bilirubin Urine: NEGATIVE
Glucose, UA: NEGATIVE mg/dL
Ketones, ur: NEGATIVE mg/dL
Nitrite: NEGATIVE
Protein, ur: >300 mg/dL — AB
Specific Gravity, Urine: 1.014 (ref 1.005–1.030)
Urobilinogen, UA: 0.2 mg/dL (ref 0.0–1.0)
pH: 5 (ref 5.0–8.0)

## 2013-02-08 LAB — URINE MICROSCOPIC-ADD ON

## 2013-02-08 LAB — HEMOGLOBIN AND HEMATOCRIT, BLOOD
HCT: 21.2 % — ABNORMAL LOW (ref 39.0–52.0)
Hemoglobin: 7.9 g/dL — ABNORMAL LOW (ref 13.0–17.0)

## 2013-02-08 MED ORDER — SACCHAROMYCES BOULARDII 250 MG PO CAPS
250.0000 mg | ORAL_CAPSULE | Freq: Two times a day (BID) | ORAL | Status: DC
  Administered 2013-02-08 – 2013-02-12 (×8): 250 mg via ORAL
  Filled 2013-02-08 (×9): qty 1

## 2013-02-08 MED ORDER — DEXTROSE 5 % IV SOLN
1.0000 g | Freq: Every day | INTRAVENOUS | Status: DC
  Administered 2013-02-08 – 2013-02-10 (×3): 1 g via INTRAVENOUS
  Filled 2013-02-08 (×3): qty 10

## 2013-02-08 NOTE — Consult Note (Signed)
Reason for Consult: cholelithiasis Referring Physician: Sheila Oats, MD   Patrick [name] is an 57 y.o. male.  HPI: Patrick [name] is a 57 y.o. male sickle cell disease, CHF, CKD(last creatinine 2.1 in June 2014) who presents with above complaints as well as generalized weakness. He states that he was in his usual state of health until yesterday afternoon when he began having some lower abdominal pain associated with nausea vomiting and diarrhea, while out shopping.  He called EMS and was admitted thru the ER after evaluation.  Part of his work up included a CT scan which shows cholelithiasis and fluid filled distal colon consistent with history of diarrhea. He has cholelithiasis also documented on CT 05/2011.  He has chronically elevated LFT's, currently T Bilirubin is 2.1, in June it was up to 3.4.  Lipase on admit was 89.  Additional problems at admit included low O2 sats, WBC was elevated and low H/H. He denies any pain after eating and was fine until onset of N/V/D yesterday afternoon.  He was admitted with a diagnosis of Probable gastroenteritis.  Because of his cholelithiasis we were ask to see.  Past Medical History  Diagnosis Date   Sickle cell Anemia    CHF/NICM  EF 40%, LVH, mild MR, L>R Atrial dilitation, Pulmonary hypertension (mild)    Chronic renal Insuffiency    Tobacco use    Gout    Patrick [name]    Chronic LFT elevation   . Swelling abdomen   . Swelling of extremity, left/ . Swelling of extremity, right     . CHF (congestive heart failure)   . Blood transfusion     "today is the second time I've had a blood transfusion" (11/21/2012)  . Sickle cell anemia   . Blood dyscrasia   . Exertional shortness of breath   . Patrick [name]     Past Surgical History  Procedure Laterality Date  . No past surgeries      Family History  Problem Relation Age of Onset  . Alcohol abuse Mother   . Liver disease Mother   . Cirrhosis Mother     Social History:   reports that he has been smoking Cigarettes.  He has a 10 pack-year smoking history. He has never used smokeless tobacco. He reports that  drinks alcohol. He reports that he does not use illicit drugs.  Allergies: No Known Allergies  Medications:  Prior to Admission:  Prescriptions prior to admission  Medication Sig Dispense Refill  . allopurinol (ZYLOPRIM) 100 MG tablet Take 1 tablet (100 mg total) by mouth daily.  30 tablet  12  . aspirin 81 MG tablet Take 81 mg by mouth daily.      . carvedilol (COREG) 25 MG tablet Take 1 tablet (25 mg total) by mouth 2 (two) times daily with a meal.  60 tablet  6  . cholecalciferol (VITAMIN D) 1000 UNITS tablet Take 1,000 Units by mouth daily.      . furosemide (LASIX) 40 MG tablet Take 1.5 tablets (60 mg total) by mouth daily.  40 tablet  3  . hydrALAZINE (APRESOLINE) 25 MG tablet Take 0.5 tablets (12.5 mg total) by mouth 3 (three) times daily.  45 tablet  3  . isosorbide mononitrate (IMDUR) 30 MG 24 hr tablet Take 1 tablet (30 mg total) by mouth daily.  30 tablet  3   Scheduled: . allopurinol  100 mg Oral Daily  . aspirin  81 mg Oral Daily  .  carvedilol  25 mg Oral BID WC  . cefTRIAXone (ROCEPHIN)  IV  1 g Intravenous Daily  . enoxaparin (LOVENOX) injection  30 mg Subcutaneous Q24H  . hydrALAZINE  12.5 mg Oral TID  . isosorbide mononitrate  30 mg Oral Daily  . pantoprazole (PROTONIX) IV  40 mg Intravenous Daily  . sodium chloride  3 mL Intravenous Q12H   Continuous: . sodium chloride 1,000 mL (02/07/13 2108)   [id number], acetaminophen, HYDROmorphone (DILAUDID) injection, ondansetron (ZOFRAN) IV, ondansetron Anti-infectives   Start     Dose/Rate Route Frequency Ordered Stop   02/08/13 1000  cefTRIAXone (ROCEPHIN) 1 g in dextrose 5 % 50 mL IVPB     1 g 100 mL/hr over 30 Minutes Intravenous Daily 02/08/13 0843        Results for orders placed during the hospital encounter of 02/07/13 (from the past 48 hour(s))  CBC WITH DIFFERENTIAL      Status: Abnormal   Collection Time    02/07/13  3:30 PM      Result Value Range   WBC 14.9 (*) 4.0 - 10.5 K/uL   Comment: WHITE COUNT CONFIRMED ON SMEAR   RBC 2.14 (*) 4.22 - 5.81 MIL/uL   Hemoglobin 6.7 (*) 13.0 - 17.0 g/dL   Comment: REPEATED TO VERIFY     CRITICAL RESULT CALLED TO, READ BACK BY AND VERIFIED WITH:     E.POUOOSE,RN 1717 02/07/13 M.CAMPBELL   HCT 18.2 (*) 39.0 - 52.0 %   MCV 85.0  78.0 - 100.0 fL   MCH 31.3  26.0 - 34.0 pg   MCHC 36.8 (*) 30.0 - 36.0 g/dL   RDW 23.3 (*) 11.5 - 15.5 %   Platelets 196  150 - 400 K/uL   Comment: PLATELET COUNT CONFIRMED BY SMEAR     LARGE PLATELETS PRESENT   Neutrophils Relative % 70  43 - 77 %   Lymphocytes Relative 11 (*) 12 - 46 %   Monocytes Relative 14 (*) 3 - 12 %   Eosinophils Relative 4  0 - 5 %   Basophils Relative 1  0 - 1 %   Neutro Abs 10.5 (*) 1.7 - 7.7 K/uL   Lymphs Abs 1.6  0.7 - 4.0 K/uL   Monocytes Absolute 2.1 (*) 0.1 - 1.0 K/uL   Eosinophils Absolute 0.6  0.0 - 0.7 K/uL   Basophils Absolute 0.1  0.0 - 0.1 K/uL   RBC Morphology POLYCHROMASIA PRESENT     Comment: TARGET CELLS     SICKLE CELLS     HOWELL/JOLLY BODIES     RARE NRBCs     PAPPENHEIMER BODIES  COMPREHENSIVE METABOLIC PANEL     Status: Abnormal   Collection Time    02/07/13  3:30 PM      Result Value Range   Sodium 137  135 - 145 mEq/L   Potassium 4.4  3.5 - 5.1 mEq/L   Chloride 106  96 - 112 mEq/L   CO2 18 (*) 19 - 32 mEq/L   Glucose, Bld 104 (*) 70 - 99 mg/dL   BUN 54 (*) 6 - 23 mg/dL   Creatinine, Ser 2.62 (*) 0.50 - 1.35 mg/dL   Calcium 8.6  8.4 - 10.5 mg/dL   Total Protein 6.6  6.0 - 8.3 g/dL   Albumin 3.4 (*) 3.5 - 5.2 g/dL   AST 41 (*) 0 - 37 U/L   ALT 11  0 - 53 U/L   Alkaline Phosphatase 78  39 - 117  U/L   Total Bilirubin 3.0 (*) 0.3 - 1.2 mg/dL   GFR calc non Af Amer 26 (*) >90 mL/min   GFR calc Af Amer 30 (*) >90 mL/min   Comment: (NOTE)     The eGFR has been calculated using the CKD EPI equation.     This calculation  has not been validated in all clinical situations.     eGFR's persistently <90 mL/min signify possible Chronic Kidney     Disease.  LIPASE, BLOOD     Status: Abnormal   Collection Time    02/07/13  3:30 PM      Result Value Range   Lipase 89 (*) 11 - 59 U/L  CG4 I-STAT (LACTIC ACID)     Status: None   Collection Time    02/07/13  3:53 PM      Result Value Range   Lactic Acid, Venous 0.89  0.5 - 2.2 mmol/L  PREPARE RBC (CROSSMATCH)     Status: None   Collection Time    02/07/13  9:08 PM      Result Value Range   Order Confirmation ORDER PROCESSED BY BLOOD BANK    TYPE AND SCREEN     Status: None   Collection Time    02/07/13  9:08 PM      Result Value Range   ABO/RH(D) AB POS     Antibody Screen NEG     Sample Expiration 02/10/2013     Unit Number [id number]     Blood Component Type RBC LR PHER1     Unit division 00     Status of Unit ISSUED,FINAL     Transfusion Status OK TO TRANSFUSE     Crossmatch Result Compatible    URINALYSIS, ROUTINE W REFLEX MICROSCOPIC     Status: Abnormal   Collection Time    02/08/13  2:44 AM      Result Value Range   Color, Urine AMBER (*) YELLOW   Comment: BIOCHEMICALS MAY BE AFFECTED BY COLOR   APPearance CLOUDY (*) CLEAR   Specific Gravity, Urine 1.014  1.005 - 1.030   pH 5.0  5.0 - 8.0   Glucose, UA NEGATIVE  NEGATIVE mg/dL   Hgb urine dipstick LARGE (*) NEGATIVE   Bilirubin Urine NEGATIVE  NEGATIVE   Ketones, ur NEGATIVE  NEGATIVE mg/dL   Protein, ur >300 (*) NEGATIVE mg/dL   Urobilinogen, UA 0.2  0.0 - 1.0 mg/dL   Nitrite NEGATIVE  NEGATIVE   Leukocytes, UA SMALL (*) NEGATIVE  URINE MICROSCOPIC-ADD ON     Status: None   Collection Time    02/08/13  2:44 AM      Result Value Range   Squamous Epithelial / LPF RARE  RARE   WBC, UA 7-10  <3 WBC/hpf   RBC / HPF 3-6  <3 RBC/hpf   Bacteria, UA RARE  RARE  CBC     Status: Abnormal   Collection Time    02/08/13  4:30 AM      Result Value Range   WBC 16.3 (*) 4.0 - 10.5 K/uL    Comment: WHITE COUNT CONFIRMED ON SMEAR     REPEATED TO VERIFY   RBC 2.24 (*) 4.22 - 5.81 MIL/uL   Hemoglobin 6.8 (*) 13.0 - 17.0 g/dL   Comment: CRITICAL VALUE NOTED.  VALUE IS CONSISTENT WITH PREVIOUSLY REPORTED AND CALLED VALUE.   HCT 18.6 (*) 39.0 - 52.0 %   MCV 83.0  78.0 - 100.0 fL   MCH  30.4  26.0 - 34.0 pg   MCHC 36.6 (*) 30.0 - 36.0 g/dL   RDW 22.5 (*) 11.5 - 15.5 %   Platelets 191  150 - 400 K/uL   Comment: PLATELET COUNT CONFIRMED BY SMEAR     REPEATED TO VERIFY  COMPREHENSIVE METABOLIC PANEL     Status: Abnormal   Collection Time    02/08/13  4:30 AM      Result Value Range   Sodium 135  135 - 145 mEq/L   Potassium 4.5  3.5 - 5.1 mEq/L   Chloride 105  96 - 112 mEq/L   CO2 18 (*) 19 - 32 mEq/L   Glucose, Bld 83  70 - 99 mg/dL   BUN 55 (*) 6 - 23 mg/dL   Creatinine, Ser 2.71 (*) 0.50 - 1.35 mg/dL   Calcium 8.5  8.4 - 10.5 mg/dL   Total Protein 6.2  6.0 - 8.3 g/dL   Albumin 3.1 (*) 3.5 - 5.2 g/dL   AST 37  0 - 37 U/L   ALT 11  0 - 53 U/L   Alkaline Phosphatase 70  39 - 117 U/L   Total Bilirubin 2.1 (*) 0.3 - 1.2 mg/dL   GFR calc non Af Amer 25 (*) >90 mL/min   GFR calc Af Amer 29 (*) >90 mL/min   Comment: (NOTE)     The eGFR has been calculated using the CKD EPI equation.     This calculation has not been validated in all clinical situations.     eGFR's persistently <90 mL/min signify possible Chronic Kidney     Disease.  HEMOGLOBIN AND HEMATOCRIT, BLOOD     Status: Abnormal   Collection Time    02/08/13  9:30 AM      Result Value Range   Hemoglobin 7.9 (*) 13.0 - 17.0 g/dL   HCT 21.2 (*) 39.0 - 52.0 %    Ct Abdomen Pelvis Wo Contrast  02/07/2013   *RADIOLOGY REPORT*  Clinical Data: Nausea, vomiting, diarrhea, syncope, left lower quadrant pain, history chronic diastolic heart failure, chronic kidney disease stage III, gout, sickle cell anemia  CT ABDOMEN AND PELVIS WITHOUT CONTRAST  Technique:  Multidetector CT imaging of the abdomen and pelvis was performed  following the standard protocol without intravenous contrast. Sagittal and coronal MPR images reconstructed from axial data set.  Comparison: 06/08/2011  Findings: Lung bases clear. Enlargement of cardiac chambers. Low attenuation foci at left kidney likely small cysts. Calcified gallstones up to the 18 mm diameter. Liver, pancreas, kidneys, and adrenal glands otherwise normal appearance. Small calcified splenic remnant post auto infarction under left diaphragm.  Fluid-filled colon distally consistent with the area of diarrhea. Majority of the colon is unopacified, some areas demonstrating normal wall thickness with remaining areas limited in assessment of wall thickness. Stomach and bowel loops otherwise normal appearance. Normal appendix. No mass, adenopathy, free fluid inflammatory process. Unremarkable bladder and ureters. Osseous changes of sickle cell disease.  IMPRESSION: Cholelithiasis. Enlargement of cardiac chambers. Fluid-filled distal colon consistent with history of diarrhea. Otherwise negative exam.   Original Report Authenticated By: Lavonia Dana, M.D.   US Abdomen Complete  02/07/2013   *RADIOLOGY REPORT*  Clinical Data:  Abdominal pain, nausea, vomiting, diarrhea.  ABDOMINAL ULTRASOUND COMPLETE  Comparison:  CT from today.  Findings:  Gallbladder:  Moderate cholelithiasis without wall thickening or pericholecystic fluid.  Negative sonographic Murphy's sign.  Common Bile Duct:  Within normal measuring 3-4.3 mm.  Liver: No focal mass lesion identified.  Within normal limits in parenchymal echogenicity.  IVC:  Not visualized.  Pancreas:  No abnormality identified.  Spleen:  Not visualized.  Right kidney:  Measures 11.4 cm in diameter with increased cortical echogenicity.  Left kidney:  Measures 12.2 cm in diameter with increased cortical echogenicity.  Several left renal cyst are present with the largest measuring 1.3 cm over the lower pole.  Abdominal Aorta:  No aneurysm identified.  Distal segment  not well seen.  IMPRESSION: Moderate cholelithiasis without additional sonographic evidence to suggest cholecystitis.  Normal sized kidneys with increased cortical echogenicity likely due to medical renal disease.  3 small left renal cysts.   Original Report Authenticated By: Marin Olp, M.D.    Review of Systems  Constitutional: Negative for fever, chills, weight loss, malaise/fatigue and diaphoresis.  HENT: Negative.   Eyes:       Patrick [name] can see images on TV but cannot tell you what they are.  Respiratory: Negative.   Cardiovascular: Positive for leg swelling. Negative for chest pain, palpitations, orthopnea, claudication and PND.       CHF, stable on medication.  Gastrointestinal: Positive for nausea (yesterday afternoon), vomiting (yesterday afternoon) and diarrhea (yesterday afternoon.). Negative for heartburn, constipation, blood in stool and melena.       Nausea, vomiting and diarrhea with onset of symptoms yesterday.  This has resolved.  No GI issues prior to yesterdays acute onset.  Genitourinary: Negative.   Musculoskeletal: Negative.   Skin: Negative.   Neurological: Negative.  Negative for weakness.  Endo/Heme/Allergies: Negative.   Psychiatric/Behavioral: Negative.    Blood pressure 117/66, pulse 72, temperature 98.4 F (36.9 C), temperature source Oral, resp. rate 18, height 5\' 10"  (1.778 m), weight 66.764 kg (147 lb 3 oz), SpO2 96.00%. Physical Exam  Constitutional: He is oriented to person, place, and time. He appears well-developed and well-nourished. No distress.  HENT:  Head: Normocephalic and atraumatic.  Nose: Nose normal.  Eyes: Conjunctivae and EOM are normal. Pupils are equal, round, and reactive to light. Right eye exhibits no discharge. Left eye exhibits no discharge. No scleral icterus.  Neck: Normal range of motion. Neck supple. No JVD present. No tracheal deviation present. No thyromegaly present.  Cardiovascular: Normal rate, regular rhythm, normal  heart sounds and intact distal pulses.  Exam reveals no gallop.   No murmur heard. Respiratory: Effort normal and breath sounds normal. No respiratory distress. He has no wheezes. He has no rales. He exhibits no tenderness.  GI: Soft. Bowel sounds are normal. He exhibits no distension and no mass. There is no tenderness. There is no rebound and no guarding.  Musculoskeletal: He exhibits no edema and no tenderness.  Lymphadenopathy:    He has no cervical adenopathy.  Neurological: He is oriented to person, place, and time. No cranial nerve deficit.  Skin: Skin is warm and dry. No rash noted. He is not diaphoretic. No erythema. No pallor.  Psychiatric: He has a normal mood and affect. His behavior is normal. Judgment and thought content normal.    Assessment/Plan: 1.  Cholelithiasis  (lipase 89 on admit) 2.  Probable gastroenteritis (symptoms better today on a clear diet) 3.  Sickle cell anemia (H/H 6.7/18.2 on admission) 4.  Chronic systolic CHF EF [id number], Mild MR 5.  Chronic renal Insuffiencey 6.  Tobacco use 7.  Gout 8.  Patrick [name] 9.  Chronically elevated Bilirubin  Plan:  At this time his symptoms have resolved and he's doing well on clears.  He has had know  cholelithiasis for 2 years and does not describe any symptoms consistent gallbladder disease.  He has been transfused and diarrhea, nausea and vomiting have resolved. He has labs in the AM, I  doubt this is due to his cholelithiasis.  Dr. Barry Dienes will see this PM and we will see again in the AM.  Delight Bickle 02/08/2013, 4:22 PM

## 2013-02-08 NOTE — Progress Notes (Addendum)
[facility] PROGRESS NOTE  Patrick [name] [id number] DOB: 1955/09/17 DOA: 02/07/2013 PCP: Kevan Ny, MD  Assessment/Plan: Nausea/vomiting/diarrhea, with lower abdominal pain-probable gastroenteritis  -diarrhea decreasing, follow stool studies/GI pathogen panel including a C. difficile PCR and stool culture  -continue Supportive care, IVF antiemetics pain management  Elevated lipase/chemical Pancreatitis  -His lipase is mildly elevated, but CT scan shows a normal pancreas and he denies having had any left upper quadrant pain and he is nontender on exam(albeit following Dilaudid)  -start clears and follow  -Holding lipase Lasix for now while he is n.p.o. and given diarrhea, but also can be a cause of pancreatitis  -Follow and recheck lipase in am  Cholelithiasis  -He has no right upper quadrant tenderness on exam, and no significant change in his LFTs from June 2014  -I have consulted [facility] for further recs  Acute CKD (chronic kidney disease), stage III  -Secondary to volume depletion  -continue Gentle hydration as above, follow and recheck  Chronic systolic heart failure  -continue holding Lasix for now as discussed above, he reamins compensated at this time the  -Monitor fluid status closely with cautious hydration  -Follow and resume Lasix when clinically appropriate, continue his other outpatient meds as BP tolerates.  -He is followed by Dr. Haroldine Laws  Sickle cell anemia  -hgb 6.7 today(usually about 8.), S/P transfusion of 1 unit  -hgb 7.9 on recheck, follow.  -He has no evidence of pain crisis at this time .  -He states he was followed by Dr. Marlou Sa in last saw him in February, he plans to go to the [facility] but has not yet seen Dr. Zigmund Daniel.  Leukocytosis, unspecified  -Rocephin for possible uti as per UA -follow urine and stool cultures  HTN (hypertension)  -Continue Coreg and hydralazine, monitor and further treat accordingly  History of gout  -Continue  allopurinol Probable UTI -start rocephin, follow culture   Code Status: full Family Communication: none at bedside Disposition Plan: to home when medically stable   Consultants:  [facility]- eval pending  Procedures:  none  Antibiotics:  Rocephin started on 8/24  HPI/Subjective: States diarrhea and lower abd decreasing, denies n/v  Objective: Filed Vitals:   02/08/13 0947  BP: 117/66  Pulse: 72  Temp: 98.4 F (36.9 C)  Resp: 18    Intake/Output Summary (Last 24 hours) at 02/08/13 1147 Last data filed at 02/08/13 0900  Gross per 24 hour  Intake   62.5 ml  Output    150 ml  Net  -87.5 ml   Filed Weights   02/07/13 1452 02/07/13 1944  Weight: 69.4 kg (153 lb) 66.764 kg (147 lb 3 oz)    Exam:  General: alert & oriented x 3 In NAD Cardiovascular: RRR, nl S1 s2 Respiratory: CTAB, no crackles, no wheezes  Abdomen: soft +BS NT/ND, no masses palpable Extremities: No cyanosis and no edema     Data Reviewed: Basic Metabolic Panel:  Recent Labs Lab 02/07/13 1530 02/08/13 0430  NA 137 135  K 4.4 4.5  CL 106 105  CO2 18* 18*  GLUCOSE 104* 83  BUN 54* 55*  CREATININE 2.62* 2.71*  CALCIUM 8.6 8.5   Liver Function Tests:  Recent Labs Lab 02/07/13 1530 02/08/13 0430  AST 41* 37  ALT 11 11  ALKPHOS 78 70  BILITOT 3.0* 2.1*  PROT 6.6 6.2  ALBUMIN 3.4* 3.1*    Recent Labs Lab 02/07/13 1530  LIPASE 89*   No results found for this  basename: AMMONIA,  in the last 168 hours CBC:  Recent Labs Lab 02/07/13 1530 02/08/13 0430 02/08/13 0930  WBC 14.9* 16.3*  --   NEUTROABS 10.5*  --   --   HGB 6.7* 6.8* 7.9*  HCT 18.2* 18.6* 21.2*  MCV 85.0 83.0  --   PLT 196 191  --    Cardiac Enzymes: No results found for this basename: CKTOTAL, CKMB, CKMBINDEX, TROPONINI,  in the last 168 hours BNP (last 3 results)  Recent Labs  11/20/12 1737  PROBNP 6715.0*   CBG: No results found for this basename: GLUCAP,  in the last 168 hours  No results found  for this or any previous visit (from the past 240 hour(s)).   Studies: Ct Abdomen Pelvis Wo Contrast  02/07/2013   *RADIOLOGY REPORT*  Clinical Data: Nausea, vomiting, diarrhea, syncope, left lower quadrant pain, history chronic diastolic heart failure, chronic kidney disease stage III, gout, sickle cell anemia  CT ABDOMEN AND PELVIS WITHOUT CONTRAST  Technique:  Multidetector CT imaging of the abdomen and pelvis was performed following the standard protocol without intravenous contrast. Sagittal and coronal MPR images reconstructed from axial data set.  Comparison: 06/08/2011  Findings: Lung bases clear. Enlargement of cardiac chambers. Low attenuation foci at left kidney likely small cysts. Calcified gallstones up to the 18 mm diameter. Liver, pancreas, kidneys, and adrenal glands otherwise normal appearance. Small calcified splenic remnant post auto infarction under left diaphragm.  Fluid-filled colon distally consistent with the area of diarrhea. Majority of the colon is unopacified, some areas demonstrating normal wall thickness with remaining areas limited in assessment of wall thickness. Stomach and bowel loops otherwise normal appearance. Normal appendix. No mass, adenopathy, free fluid inflammatory process. Unremarkable bladder and ureters. Osseous changes of sickle cell disease.  IMPRESSION: Cholelithiasis. Enlargement of cardiac chambers. Fluid-filled distal colon consistent with history of diarrhea. Otherwise negative exam.   Original Report Authenticated By: Lavonia Dana, M.D.   US Abdomen Complete  02/07/2013   *RADIOLOGY REPORT*  Clinical Data:  Abdominal pain, nausea, vomiting, diarrhea.  ABDOMINAL ULTRASOUND COMPLETE  Comparison:  CT from today.  Findings:  Gallbladder:  Moderate cholelithiasis without wall thickening or pericholecystic fluid.  Negative sonographic Murphy's sign.  Common Bile Duct:  Within normal measuring 3-4.3 mm.  Liver: No focal mass lesion identified.  Within normal limits  in parenchymal echogenicity.  IVC:  Not visualized.  Pancreas:  No abnormality identified.  Spleen:  Not visualized.  Right kidney:  Measures 11.4 cm in diameter with increased cortical echogenicity.  Left kidney:  Measures 12.2 cm in diameter with increased cortical echogenicity.  Several left renal cyst are present with the largest measuring 1.3 cm over the lower pole.  Abdominal Aorta:  No aneurysm identified.  Distal segment not well seen.  IMPRESSION: Moderate cholelithiasis without additional sonographic evidence to suggest cholecystitis.  Normal sized kidneys with increased cortical echogenicity likely due to medical renal disease.  3 small left renal cysts.   Original Report Authenticated By: Marin Olp, M.D.    Scheduled Meds: . allopurinol  100 mg Oral Daily  . aspirin  81 mg Oral Daily  . carvedilol  25 mg Oral BID WC  . cefTRIAXone (ROCEPHIN)  IV  1 g Intravenous Daily  . enoxaparin (LOVENOX) injection  30 mg Subcutaneous Q24H  . hydrALAZINE  12.5 mg Oral TID  . isosorbide mononitrate  30 mg Oral Daily  . pantoprazole (PROTONIX) IV  40 mg Intravenous Daily  . sodium chloride  3  mL Intravenous Q12H   Continuous Infusions: . sodium chloride 1,000 mL (02/07/13 2108)    Active Problems:   HTN (hypertension)   CKD (chronic kidney disease), stage III   Chronic systolic heart failure   Sickle cell anemia   Pancreatitis, acute   Cholelithiasis   Leukocytosis, unspecified   Nausea vomiting and diarrhea    Time spent: [facility] Pager [phone]. If 7PM-7AM, please contact night-coverage at [email or web address], password [facility] 02/08/2013, 11:47 AM  LOS: 1 day

## 2013-02-08 NOTE — Consult Note (Signed)
Pt seen and examined. Agree with above. Pt has unclear source of current symptoms, but does have chemical pancreatitis and gallstones.  Will see what repeat lipase shows.  Dr. Rosendo Gros to take over service tomorrow.  Surgery will be at his discretion.  If decision is made for surgery, he will need cardiac clearance.

## 2013-02-09 DIAGNOSIS — K5289 Other specified noninfective gastroenteritis and colitis: Secondary | ICD-10-CM

## 2013-02-09 DIAGNOSIS — N183 Chronic kidney disease, stage 3 unspecified: Secondary | ICD-10-CM

## 2013-02-09 DIAGNOSIS — N390 Urinary tract infection, site not specified: Secondary | ICD-10-CM

## 2013-02-09 DIAGNOSIS — I10 Essential (primary) hypertension: Secondary | ICD-10-CM

## 2013-02-09 DIAGNOSIS — D571 Sickle-cell disease without crisis: Secondary | ICD-10-CM

## 2013-02-09 LAB — CBC
HCT: 16.8 % — ABNORMAL LOW (ref 39.0–52.0)
Hemoglobin: 6.2 g/dL — CL (ref 13.0–17.0)
MCH: 30.4 pg (ref 26.0–34.0)
MCHC: 36.9 g/dL — ABNORMAL HIGH (ref 30.0–36.0)
MCV: 82.4 fL (ref 78.0–100.0)
Platelets: 169 10*3/uL (ref 150–400)
RBC: 2.04 MIL/uL — ABNORMAL LOW (ref 4.22–5.81)
RDW: 23.3 % — ABNORMAL HIGH (ref 11.5–15.5)
WBC: 12.8 10*3/uL — ABNORMAL HIGH (ref 4.0–10.5)

## 2013-02-09 LAB — COMPREHENSIVE METABOLIC PANEL
ALT: 9 U/L (ref 0–53)
AST: 33 U/L (ref 0–37)
Albumin: 2.9 g/dL — ABNORMAL LOW (ref 3.5–5.2)
Alkaline Phosphatase: 66 U/L (ref 39–117)
BUN: 51 mg/dL — ABNORMAL HIGH (ref 6–23)
CO2: 19 mEq/L (ref 19–32)
Calcium: 8.5 mg/dL (ref 8.4–10.5)
Chloride: 105 mEq/L (ref 96–112)
Creatinine, Ser: 2.33 mg/dL — ABNORMAL HIGH (ref 0.50–1.35)
GFR calc Af Amer: 35 mL/min — ABNORMAL LOW (ref >90)
GFR calc non Af Amer: 30 mL/min — ABNORMAL LOW (ref >90)
Glucose, Bld: 90 mg/dL (ref 70–99)
Potassium: 4.4 mEq/L (ref 3.5–5.1)
Sodium: 134 mEq/L — ABNORMAL LOW (ref 135–145)
Total Bilirubin: 2.5 mg/dL — ABNORMAL HIGH (ref 0.3–1.2)
Total Protein: 6 g/dL (ref 6.0–8.3)

## 2013-02-09 LAB — URINE CULTURE
Colony Count: NO GROWTH
Culture: NO GROWTH

## 2013-02-09 LAB — GI PATHOGEN PANEL BY PCR, STOOL
C difficile toxin A/B: NEGATIVE
Campylobacter by PCR: NEGATIVE
Cryptosporidium by PCR: NEGATIVE
E coli (ETEC) LT/ST: NEGATIVE
E coli (STEC): NEGATIVE
E coli 0157 by PCR: NEGATIVE
G lamblia by PCR: NEGATIVE
Norovirus GI/GII: NEGATIVE
Rotavirus A by PCR: NEGATIVE
Salmonella by PCR: NEGATIVE
Shigella by PCR: NEGATIVE

## 2013-02-09 LAB — HEMOGLOBIN AND HEMATOCRIT, BLOOD
HCT: 18.8 % — ABNORMAL LOW (ref 39.0–52.0)
Hemoglobin: 7.1 g/dL — ABNORMAL LOW (ref 13.0–17.0)

## 2013-02-09 LAB — LIPASE, BLOOD: Lipase: 42 U/L (ref 11–59)

## 2013-02-09 MED ORDER — PNEUMOCOCCAL VAC POLYVALENT 25 MCG/0.5ML IJ INJ
0.5000 mL | INJECTION | INTRAMUSCULAR | Status: AC
  Administered 2013-02-10: 0.5 mL via INTRAMUSCULAR
  Filled 2013-02-09: qty 0.5

## 2013-02-09 MED ORDER — OXYCODONE HCL 5 MG PO TABS
5.0000 mg | ORAL_TABLET | ORAL | Status: DC | PRN
  Administered 2013-02-10 (×4): 5 mg via ORAL
  Filled 2013-02-09 (×4): qty 1

## 2013-02-09 NOTE — Progress Notes (Signed)
Utilization review completed.  

## 2013-02-09 NOTE — Progress Notes (Signed)
CRITICAL VALUE ALERT  Critical value received:  Hgb 6.2  Date of notification:  02/09/13  Time of notification:  0555  Critical value read back:yes  Nurse who received alert:  C.Makynzee Tigges, RN  MD notified (1st page):  [facility]  Time of first page:  [phone]  MD notified (2nd page):  Time of second page:  Responding MD:  Karolee Stamps, PA  Time MD responded:  [phone]

## 2013-02-09 NOTE — Progress Notes (Signed)
[facility] PROGRESS NOTE LATE ENTRY:  PT SEEN AT 1:30PM  Authur WALDON LODES [id number] DOB: Apr 01, 1956 DOA: 02/07/2013 PCP: Marlou Sa ERIC, MD  Assessment/Plan: Nausea/vomiting/diarrhea, with lower abdominal pain-probable gastroenteritis  -diarrhea resolving, stool studies/GI pathogen panel including a C. difficile PCR and stool culture still pending -continue Supportive care, IVF antiemetics pain management  -advance diet Elevated lipase/chemical Pancreatitis  -His lipase is mildly elevated, but CT scan shows a normal pancreas and he denies having had any left upper quadrant pain and he is nontender on exam(albeit following Dilaudid)  -start clears and follow  -Holding lipase Lasix for now while he is n.p.o. and given diarrhea, but also can be a cause of pancreatitis  - lipase now normalized, advance diet Cholelithiasis  -He has no right upper quadrant tenderness on exam, and no significant change in his LFTs from June 2014  -appreciate CCS assistance, outpt follow up Acute CKD (chronic kidney disease), stage III  -Secondary to volume depletion  -continue Gentle hydration as above, follow and recheck  Chronic systolic heart failure  -continue holding Lasix for now as discussed above, he reamins compensated at this time the  -decrease IVF to [facility]  -Follow and resume Lasix when clinically appropriate, continue his other outpatient meds as BP tolerates.  -He is followed by Dr. Haroldine Laws  Sickle cell anemia  -hgb 6.7 today(usually about 8.), S/P transfusion of 1 unit  -hgb 7.1 on recheck.  -He has no evidence of pain crisis at this time .  -He states he was followed by Dr. Marlou Sa in last saw him in February, he plans to [facility] the [facility] but has not yet seen Dr. Zigmund Daniel.  Leukocytosis, unspecified  -continueRocephin for possible uti , urine cx NGTD - stool studies pending HTN (hypertension)  -Continue Coreg and hydralazine, monitor and further treat accordingly   History of gout  -Continue allopurinol Probable UTI -Continue rocephin, urine culture with NGTD   Code Status: full Family Communication: none at bedside Disposition Plan: to home when medically stable   Consultants:  CCS- eval pending  Procedures:  none  Antibiotics:  Rocephin started on 8/24  HPI/Subjective: No further diarrhea, abd pain decreased  Objective: Filed Vitals:   02/09/13 2013  BP: 112/60  Pulse: 78  Temp: 98.8 F (37.1 C)  Resp: 18    Intake/Output Summary (Last 24 hours) at 02/09/13 2211 Last data filed at 02/09/13 2016  Gross per 24 hour  Intake   1710 ml  Output      0 ml  Net   1710 ml   Filed Weights   02/07/13 1944 02/08/13 2122 02/09/13 2013  Weight: 66.764 kg (147 lb 3 oz) 67.858 kg (149 lb 9.6 oz) 67.858 kg (149 lb 9.6 oz)    Exam:  General: alert & oriented x 3 In NAD Cardiovascular: RRR, nl S1 s2 Respiratory: CTAB, no crackles, no wheezes  Abdomen: soft +BS NT/ND, no masses palpable Extremities: No cyanosis and no edema     Data Reviewed: Basic Metabolic Panel:  Recent Labs Lab 02/07/13 1530 02/08/13 0430 02/09/13 0445  NA 137 135 134*  K 4.4 4.5 4.4  CL 106 105 105  CO2 18* 18* 19  GLUCOSE 104* 83 90  BUN 54* 55* 51*  CREATININE 2.62* 2.71* 2.33*  CALCIUM 8.6 8.5 8.5   Liver Function Tests:  Recent Labs Lab 02/07/13 1530 02/08/13 0430 02/09/13 0445  AST 41* 37 33  ALT 11 11 9   ALKPHOS 78 70 66  BILITOT 3.0* 2.1* 2.5*  PROT 6.6 6.2 6.0  ALBUMIN 3.4* 3.1* 2.9*    Recent Labs Lab 02/07/13 1530 02/09/13 0445  LIPASE 89* 42   No results found for this basename: AMMONIA,  in the last 168 hours CBC:  Recent Labs Lab 02/07/13 1530 02/08/13 0430 02/08/13 0930 02/09/13 0445 02/09/13 0918  WBC 14.9* 16.3*  --  12.8*  --   NEUTROABS 10.5*  --   --   --   --   HGB 6.7* 6.8* 7.9* 6.2* 7.1*  HCT 18.2* 18.6* 21.2* 16.8* 18.8*  MCV 85.0 83.0  --  82.4  --   PLT 196 191  --  169  --    Cardiac  Enzymes: No results found for this basename: CKTOTAL, CKMB, CKMBINDEX, TROPONINI,  in the last 168 hours BNP (last 3 results)  Recent Labs  11/20/12 1737  PROBNP 6715.0*   CBG: No results found for this basename: GLUCAP,  in the last 168 hours  Recent Results (from the past 240 hour(s))  STOOL CULTURE     Status: None   Collection Time    02/08/13  2:44 AM      Result Value Range Status   Specimen Description STOOL   Final   Special Requests NONE   Final   Culture     Final   Value: Culture reincubated for better growth     Performed at [facility]   Report Status PENDING   Incomplete  URINE CULTURE     Status: None   Collection Time    02/08/13  2:44 AM      Result Value Range Status   Specimen Description URINE, CLEAN CATCH   Final   Special Requests NONE   Final   Culture  Setup Time     Final   Value: 02/08/2013 15:52     Performed at [place]     Final   Value: NO GROWTH     Performed at [facility]   Culture     Final   Value: NO GROWTH     Performed at [facility]   Report Status 02/09/2013 FINAL   Final     Studies: US Abdomen Complete  02/07/2013   *RADIOLOGY REPORT*  Clinical Data:  Abdominal pain, nausea, vomiting, diarrhea.  ABDOMINAL ULTRASOUND COMPLETE  Comparison:  CT from today.  Findings:  Gallbladder:  Moderate cholelithiasis without wall thickening or pericholecystic fluid.  Negative sonographic Murphy's sign.  Common Bile Duct:  Within normal measuring 3-4.3 mm.  Liver: No focal mass lesion identified.  Within normal limits in parenchymal echogenicity.  IVC:  Not visualized.  Pancreas:  No abnormality identified.  Spleen:  Not visualized.  Right kidney:  Measures 11.4 cm in diameter with increased cortical echogenicity.  Left kidney:  Measures 12.2 cm in diameter with increased cortical echogenicity.  Several left renal cyst are present with the largest measuring 1.3 cm over the lower pole.  Abdominal  Aorta:  No aneurysm identified.  Distal segment not well seen.  IMPRESSION: Moderate cholelithiasis without additional sonographic evidence to suggest cholecystitis.  Normal sized kidneys with increased cortical echogenicity likely due to medical renal disease.  3 small left renal cysts.   Original Report Authenticated By: Marin Olp, M.D.    Scheduled Meds: . allopurinol  100 mg Oral Daily  . aspirin  81 mg Oral Daily  . carvedilol  25 mg Oral BID WC  .  cefTRIAXone (ROCEPHIN)  IV  1 g Intravenous Daily  . enoxaparin (LOVENOX) injection  30 mg Subcutaneous Q24H  . hydrALAZINE  12.5 mg Oral TID  . isosorbide mononitrate  30 mg Oral Daily  . pantoprazole (PROTONIX) IV  40 mg Intravenous Daily  . [START ON 02/10/2013] pneumococcal 23 valent vaccine  0.5 mL Intramuscular Tomorrow-1000  . saccharomyces boulardii  250 mg Oral BID  . sodium chloride  3 mL Intravenous Q12H   Continuous Infusions: . sodium chloride 50 mL/hr at 02/09/13 1541    Active Problems:   HTN (hypertension)   CKD (chronic kidney disease), stage III   Chronic systolic heart failure   Sickle cell anemia   Pancreatitis, acute   Cholelithiasis   Leukocytosis, unspecified   Nausea vomiting and diarrhea    Time spent: [facility] Pager [phone]. If 7PM-7AM, please contact night-coverage at [email or web address], password [facility] 02/09/2013, 10:11 PM  LOS: 2 days

## 2013-02-09 NOTE — Progress Notes (Signed)
Subjective: Pt with no abd pain.  No diarrhea.  Objective: Vital signs in last 24 hours: Temp:  [97.8 F (36.6 C)-98.4 F (36.9 C)] 98.4 F (36.9 C) (08/25 0508) Pulse Rate:  [66-74] 73 (08/25 0508) Resp:  [18] 18 (08/25 0508) BP: (112-118)/(60-68) 113/60 mmHg (08/25 0508) SpO2:  [95 %-98 %] 95 % (08/25 0508) Weight:  [149 lb 9.6 oz (67.858 kg)] 149 lb 9.6 oz (67.858 kg) (08/24 2122) Last BM Date: 02/08/13  Intake/Output from previous day: 08/24 0701 - 08/25 0700 In: 1682.5 [P.O.:480; I.V.:1202.5] Out: -  Intake/Output this shift:    General appearance: alert and cooperative GI: soft, NT, ND, active BS  Lab Results:   Recent Labs  02/08/13 0430 02/08/13 0930 02/09/13 0445  WBC 16.3*  --  12.8*  HGB 6.8* 7.9* 6.2*  HCT 18.6* 21.2* 16.8*  PLT 191  --  169   BMET  Recent Labs  02/08/13 0430 02/09/13 0445  NA 135 134*  K 4.5 4.4  CL 105 105  CO2 18* 19  GLUCOSE 83 90  BUN 55* 51*  CREATININE 2.71* 2.33*  CALCIUM 8.5 8.5   PT/INR No results found for this basename: LABPROT, INR,  in the last 72 hours ABG No results found for this basename: PHART, PCO2, PO2, HCO3,  in the last 72 hours  Studies/Results: Ct Abdomen Pelvis Wo Contrast  02/07/2013   *RADIOLOGY REPORT*  Clinical Data: Nausea, vomiting, diarrhea, syncope, left lower quadrant pain, history chronic diastolic heart failure, chronic kidney disease stage III, gout, sickle cell anemia  CT ABDOMEN AND PELVIS WITHOUT CONTRAST  Technique:  Multidetector CT imaging of the abdomen and pelvis was performed following the standard protocol without intravenous contrast. Sagittal and coronal MPR images reconstructed from axial data set.  Comparison: 06/08/2011  Findings: Lung bases clear. Enlargement of cardiac chambers. Low attenuation foci at left kidney likely small cysts. Calcified gallstones up to the 18 mm diameter. Liver, pancreas, kidneys, and adrenal glands otherwise normal appearance. Small calcified  splenic remnant post auto infarction under left diaphragm.  Fluid-filled colon distally consistent with the area of diarrhea. Majority of the colon is unopacified, some areas demonstrating normal wall thickness with remaining areas limited in assessment of wall thickness. Stomach and bowel loops otherwise normal appearance. Normal appendix. No mass, adenopathy, free fluid inflammatory process. Unremarkable bladder and ureters. Osseous changes of sickle cell disease.  IMPRESSION: Cholelithiasis. Enlargement of cardiac chambers. Fluid-filled distal colon consistent with history of diarrhea. Otherwise negative exam.   Original Report Authenticated By: Lavonia Dana, M.D.   US Abdomen Complete  02/07/2013   *RADIOLOGY REPORT*  Clinical Data:  Abdominal pain, nausea, vomiting, diarrhea.  ABDOMINAL ULTRASOUND COMPLETE  Comparison:  CT from today.  Findings:  Gallbladder:  Moderate cholelithiasis without wall thickening or pericholecystic fluid.  Negative sonographic Murphy's sign.  Common Bile Duct:  Within normal measuring 3-4.3 mm.  Liver: No focal mass lesion identified.  Within normal limits in parenchymal echogenicity.  IVC:  Not visualized.  Pancreas:  No abnormality identified.  Spleen:  Not visualized.  Right kidney:  Measures 11.4 cm in diameter with increased cortical echogenicity.  Left kidney:  Measures 12.2 cm in diameter with increased cortical echogenicity.  Several left renal cyst are present with the largest measuring 1.3 cm over the lower pole.  Abdominal Aorta:  No aneurysm identified.  Distal segment not well seen.  IMPRESSION: Moderate cholelithiasis without additional sonographic evidence to suggest cholecystitis.  Normal sized kidneys with increased cortical echogenicity  likely due to medical renal disease.  3 small left renal cysts.   Original Report Authenticated By: Marin Olp, M.D.    Anti-infectives: Anti-infectives   Start     Dose/Rate Route Frequency Ordered Stop   02/08/13 1000   cefTRIAXone (ROCEPHIN) 1 g in dextrose 5 % 50 mL IVPB     1 g 100 mL/hr over 30 Minutes Intravenous Daily 02/08/13 0843        Assessment/Plan: Acute gastroenteritis -no acute surgical plans, can f/u as outpt for cholecystectomy -Call back with questions  LOS: 2 days    Rosario Jacks., [facility] 02/09/2013

## 2013-02-10 LAB — BASIC METABOLIC PANEL
BUN: 51 mg/dL — ABNORMAL HIGH (ref 6–23)
CO2: 18 mEq/L — ABNORMAL LOW (ref 19–32)
Calcium: 8.5 mg/dL (ref 8.4–10.5)
Chloride: 106 mEq/L (ref 96–112)
Creatinine, Ser: 2.1 mg/dL — ABNORMAL HIGH (ref 0.50–1.35)
GFR calc Af Amer: 39 mL/min — ABNORMAL LOW (ref >90)
GFR calc non Af Amer: 34 mL/min — ABNORMAL LOW (ref >90)
Glucose, Bld: 95 mg/dL (ref 70–99)
Potassium: 4.9 mEq/L (ref 3.5–5.1)
Sodium: 135 mEq/L (ref 135–145)

## 2013-02-10 MED ORDER — COLCHICINE 0.6 MG PO TABS
0.3000 mg | ORAL_TABLET | Freq: Every day | ORAL | Status: DC
  Administered 2013-02-10: 0.3 mg via ORAL
  Filled 2013-02-10: qty 0.5

## 2013-02-10 MED ORDER — PREDNISONE 20 MG PO TABS
40.0000 mg | ORAL_TABLET | Freq: Every day | ORAL | Status: DC
  Administered 2013-02-11 – 2013-02-12 (×2): 40 mg via ORAL
  Filled 2013-02-10 (×3): qty 2

## 2013-02-10 MED ORDER — ENOXAPARIN SODIUM 40 MG/0.4ML ~~LOC~~ SOLN
40.0000 mg | SUBCUTANEOUS | Status: DC
  Filled 2013-02-10 (×3): qty 0.4

## 2013-02-10 MED ORDER — METHYLPREDNISOLONE SODIUM SUCC 40 MG IJ SOLR
40.0000 mg | Freq: Once | INTRAMUSCULAR | Status: AC
  Administered 2013-02-10: 40 mg via INTRAVENOUS
  Filled 2013-02-10: qty 1

## 2013-02-10 NOTE — Progress Notes (Addendum)
[facility] PROGRESS NOTE LATE ENTRY:  PT SEEN AT 1:30PM  Patrick [name] [id number] DOB: August 17, 1955 DOA: 02/07/2013 PCP: Marlou Sa ERIC, MD  Assessment/Plan: Nausea/vomiting/diarrhea, with lower abdominal pain-probable gastroenteritis, likely viral -diarrhea resolved, stool studies/GI pathogen panel including a C. difficile PCR and stool culture  -NEG - Pt tolerating advance diet Elevated lipase/chemical Pancreatitis  -His lipase is mildly elevated, but CT scan shows a normal pancreas and he denies having had any left upper quadrant pain and he is nontender on exam(albeit following Dilaudid)  -start clears and follow   - lipase now normalized, and pt tolerating advanced diet Cholelithiasis  -He has no right upper quadrant tenderness on exam, and no significant change in his LFTs from June 2014  -appreciate CCS assistance, outpt follow up with CCS Acute CKD (chronic kidney disease), stage III  -Secondary to volume depletion  -continue Gentle hydration as above, follow and recheck  Chronic systolic heart failure  -continue holding Lasix for now as discussed above, he remains compensated at this time   -will heplock IVF today  -Follow and resume Lasix when clinically appropriate, continue his other outpatient meds as BP tolerates.  -He is followed by Dr. Haroldine Laws  Sickle cell anemia  -hgb 6.7 today(usually about 8.), S/P transfusion of 1 unit  -hgb 7.1 on recheck 8/25>> will recheck in am.  -He has no evidence of pain crisis at this time .  -He states he was followed by Dr. Marlou Sa in last saw him in February, he plans to go to the sickle cell center but has not yet seen Dr. Zigmund Daniel.  Leukocytosis, unspecified  - wbc decreased as of 8/24 -C diff, stool, urine cultures so far neg, will d/c abx  HTN (hypertension)  -Continue Coreg and hydralazine, monitor and further treat accordingly  Gout flare, L. wrist -on allopurinol -pain persisting even with oxycodone, dose of  colchicine and prn dilaudid -will give iv steroidsx1, then short taper of oral prednisone Probable UTI -treated with rocephin x3days and, urine culture with NGTD  -will d/c rocephin and follow  Code Status: full Family Communication: none at bedside Disposition Plan: to home when medically stable -possibly in am pending follow up h/h and wrist pain   Consultants:  CCS- eval pending  Procedures:  none  Antibiotics:  Rocephin started on 8/24>>8/26  HPI/Subjective: No further diarrhea, no abd pain. C/o increased L wrist pain   Objective: Filed Vitals:   02/10/13 1832  BP:   Pulse: 85  Temp: 98.1 F (36.7 C)  Resp: 18    Intake/Output Summary (Last 24 hours) at 02/10/13 1919 Last data filed at 02/10/13 1300  Gross per 24 hour  Intake    480 ml  Output      0 ml  Net    480 ml   Filed Weights   02/07/13 1944 02/08/13 2122 02/09/13 2013  Weight: 66.764 kg (147 lb 3 oz) 67.858 kg (149 lb 9.6 oz) 67.858 kg (149 lb 9.6 oz)    Exam:  General: alert & oriented x 3 In NAD Cardiovascular: RRR, nl S1 s2 Respiratory: CTAB, no crackles, no wheezes  Abdomen: soft +BS NT/ND, no masses palpable Extremities: No cyanosis and no edema. L wrist tender,+ synovitis    Data Reviewed: Basic Metabolic Panel:  Recent Labs Lab 02/07/13 1530 02/08/13 0430 02/09/13 0445 02/10/13 0550  NA 137 135 134* 135  K 4.4 4.5 4.4 4.9  CL 106 105 105 106  CO2 18* 18* 19 18*  GLUCOSE 104*  83 90 95  BUN 54* 55* 51* 51*  CREATININE 2.62* 2.71* 2.33* 2.10*  CALCIUM 8.6 8.5 8.5 8.5   Liver Function Tests:  Recent Labs Lab 02/07/13 1530 02/08/13 0430 02/09/13 0445  AST 41* 37 33  ALT 11 11 9   ALKPHOS 78 70 66  BILITOT 3.0* 2.1* 2.5*  PROT 6.6 6.2 6.0  ALBUMIN 3.4* 3.1* 2.9*    Recent Labs Lab 02/07/13 1530 02/09/13 0445  LIPASE 89* 42   No results found for this basename: AMMONIA,  in the last 168 hours CBC:  Recent Labs Lab 02/07/13 1530 02/08/13 0430  02/08/13 0930 02/09/13 0445 02/09/13 0918  WBC 14.9* 16.3*  --  12.8*  --   NEUTROABS 10.5*  --   --   --   --   HGB 6.7* 6.8* 7.9* 6.2* 7.1*  HCT 18.2* 18.6* 21.2* 16.8* 18.8*  MCV 85.0 83.0  --  82.4  --   PLT 196 191  --  169  --    Cardiac Enzymes: No results found for this basename: CKTOTAL, CKMB, CKMBINDEX, TROPONINI,  in the last 168 hours BNP (last 3 results)  Recent Labs  11/20/12 1737  PROBNP 6715.0*   CBG: No results found for this basename: GLUCAP,  in the last 168 hours  Recent Results (from the past 240 hour(s))  STOOL CULTURE     Status: None   Collection Time    02/08/13  2:44 AM      Result Value Range Status   Specimen Description STOOL   Final   Special Requests NONE   Final   Culture     Final   Value: NO SUSPICIOUS COLONIES, CONTINUING TO HOLD     Performed at [facility]   Report Status PENDING   Incomplete  URINE CULTURE     Status: None   Collection Time    02/08/13  2:44 AM      Result Value Range Status   Specimen Description URINE, CLEAN CATCH   Final   Special Requests NONE   Final   Culture  Setup Time     Final   Value: 02/08/2013 15:52     Performed at [place]     Final   Value: NO GROWTH     Performed at [facility]   Culture     Final   Value: NO GROWTH     Performed at [facility]   Report Status 02/09/2013 FINAL   Final     Studies: No results found.  Scheduled Meds: . allopurinol  100 mg Oral Daily  . aspirin  81 mg Oral Daily  . carvedilol  25 mg Oral BID WC  . cefTRIAXone (ROCEPHIN)  IV  1 g Intravenous Daily  . colchicine  0.3 mg Oral Daily  . enoxaparin (LOVENOX) injection  40 mg Subcutaneous Q24H  . hydrALAZINE  12.5 mg Oral TID  . isosorbide mononitrate  30 mg Oral Daily  . pantoprazole (PROTONIX) IV  40 mg Intravenous Daily  . saccharomyces boulardii  250 mg Oral BID  . sodium chloride  3 mL Intravenous Q12H   Continuous Infusions: . sodium chloride 20  mL/hr at 02/10/13 0217    Active Problems:   HTN (hypertension)   CKD (chronic kidney disease), stage III   Chronic systolic heart failure   Sickle cell anemia   Pancreatitis, acute   Cholelithiasis   Leukocytosis, unspecified   Nausea vomiting  and diarrhea    Time spent: [place] [facility] Pager [phone]. If 7PM-7AM, please contact night-coverage at [email or web address], password [facility] 02/10/2013, 7:19 PM  LOS: 3 days

## 2013-02-11 LAB — HEMOGLOBIN AND HEMATOCRIT, BLOOD
HCT: 17 % — ABNORMAL LOW (ref 39.0–52.0)
Hemoglobin: 6.1 g/dL — CL (ref 13.0–17.0)

## 2013-02-11 LAB — CBC
HCT: 20.6 % — ABNORMAL LOW (ref 39.0–52.0)
Hemoglobin: 7.6 g/dL — ABNORMAL LOW (ref 13.0–17.0)
MCH: 30.4 pg (ref 26.0–34.0)
MCHC: 36.9 g/dL — ABNORMAL HIGH (ref 30.0–36.0)
MCV: 82.4 fL (ref 78.0–100.0)
Platelets: 164 10*3/uL (ref 150–400)
RBC: 2.5 MIL/uL — ABNORMAL LOW (ref 4.22–5.81)
RDW: 19 % — ABNORMAL HIGH (ref 11.5–15.5)
WBC: 9.5 10*3/uL (ref 4.0–10.5)

## 2013-02-11 LAB — PREPARE RBC (CROSSMATCH)

## 2013-02-11 LAB — BASIC METABOLIC PANEL
BUN: 46 mg/dL — ABNORMAL HIGH (ref 6–23)
CO2: 18 mEq/L — ABNORMAL LOW (ref 19–32)
Calcium: 8.7 mg/dL (ref 8.4–10.5)
Chloride: 106 mEq/L (ref 96–112)
Creatinine, Ser: 2.09 mg/dL — ABNORMAL HIGH (ref 0.50–1.35)
GFR calc Af Amer: 39 mL/min — ABNORMAL LOW (ref >90)
GFR calc non Af Amer: 34 mL/min — ABNORMAL LOW (ref >90)
Glucose, Bld: 129 mg/dL — ABNORMAL HIGH (ref 70–99)
Potassium: 5.3 mEq/L — ABNORMAL HIGH (ref 3.5–5.1)
Sodium: 134 mEq/L — ABNORMAL LOW (ref 135–145)

## 2013-02-11 MED ORDER — ACETAMINOPHEN 325 MG PO TABS
650.0000 mg | ORAL_TABLET | Freq: Once | ORAL | Status: AC
  Administered 2013-02-11: 650 mg via ORAL
  Filled 2013-02-11: qty 2

## 2013-02-11 MED ORDER — DIPHENHYDRAMINE HCL 25 MG PO CAPS
25.0000 mg | ORAL_CAPSULE | Freq: Once | ORAL | Status: AC
  Administered 2013-02-11: 25 mg via ORAL
  Filled 2013-02-11: qty 1

## 2013-02-11 MED ORDER — FUROSEMIDE 10 MG/ML IJ SOLN
20.0000 mg | Freq: Once | INTRAMUSCULAR | Status: AC
  Administered 2013-02-11: 20 mg via INTRAVENOUS
  Filled 2013-02-11: qty 2

## 2013-02-11 MED ORDER — PANTOPRAZOLE SODIUM 40 MG PO TBEC
40.0000 mg | DELAYED_RELEASE_TABLET | Freq: Every day | ORAL | Status: DC
  Administered 2013-02-11 – 2013-02-12 (×2): 40 mg via ORAL
  Filled 2013-02-11 (×2): qty 1

## 2013-02-11 NOTE — Progress Notes (Signed)
[facility] PROGRESS NOTE  Patrick [name] [id number] DOB: 06/25/1955 DOA: 02/07/2013 PCP: Marlou Sa ERIC, MD  Assessment/Plan: #1 nausea/vomiting/diarrhea/low abdominal pain/probable gastroenteritis Likely a viral gastroenteritis. Stool studies pending. Nausea vomiting and diarrhea resolved. Patient tolerating current diet. Follow.  #2 chemical pancreatitis Lipase levels have normalized. No abdominal pain. Tolerating current diet.  #3 cholelithiasis Asymptomatic. No significant change in LFTs from June of 2014. Followup with general surgery as outpatient.  #4 acute on chronic kidney disease stage III Likely secondary to prerenal azotemia secondary to GI losses. Improvement with hydration. Follow.  #5 chronic systolic CHF Stable. Diuretics on hold.resume diuretics later.  #6 sickle cell anemia Baseline hemoglobin approximately 8 per patient. Patient has been transfused one unit of packed red blood cells. Repeat hemoglobin today 6.1. Transfuse 2 units of packed red blood cells. Follow H&H.  #7 leukocytosis Likely reactive leukocytosis. Stool studies pending.WBC trending down.  #8 left wrist gout flare Clinical improvement on steroids.  #9 probable UTI Status post 3 days IV Rocephin. Follow.  Code Status: Full Family Communication: updated, no family present. Disposition Plan: Home when medically stable.   Consultants:  Dr. Barry Dienes  02/08/2013  Procedures:  CT abd/pelvis 02/07/13  [place] 02/07/13  Antibiotics:  None  HPI/Subjective: Patient denies any nausea or emesis. Tolerating current diet.  Objective: Filed Vitals:   02/11/13 1712  BP: 131/69  Pulse: 78  Temp: 97.6 F (36.4 C)  Resp: 16    Intake/Output Summary (Last 24 hours) at 02/11/13 1720 Last data filed at 02/11/13 1421  Gross per 24 hour  Intake 1226.67 ml  Output      0 ml  Net 1226.67 ml   Filed Weights   02/08/13 2122 02/09/13 2013 02/10/13 2113  Weight: 67.858 kg (149 lb 9.6 oz)  67.858 kg (149 lb 9.6 oz) 67.858 kg (149 lb 9.6 oz)    Exam:   General:  NAD  Cardiovascular: RRR  Respiratory: CTAB  Abdomen: Soft/NT/ND/+BS  Musculoskeletal: 4/5 BUE strength, 4/5 BLE strength   Data Reviewed: Basic Metabolic Panel:  Recent Labs Lab 02/07/13 1530 02/08/13 0430 02/09/13 0445 02/10/13 0550 02/11/13 0114  NA 137 135 134* 135 134*  K 4.4 4.5 4.4 4.9 5.3*  CL 106 105 105 106 106  CO2 18* 18* 19 18* 18*  GLUCOSE 104* 83 90 95 129*  BUN 54* 55* 51* 51* 46*  CREATININE 2.62* 2.71* 2.33* 2.10* 2.09*  CALCIUM 8.6 8.5 8.5 8.5 8.7   Liver Function Tests:  Recent Labs Lab 02/07/13 1530 02/08/13 0430 02/09/13 0445  AST 41* 37 33  ALT 11 11 9   ALKPHOS 78 70 66  BILITOT 3.0* 2.1* 2.5*  PROT 6.6 6.2 6.0  ALBUMIN 3.4* 3.1* 2.9*    Recent Labs Lab 02/07/13 1530 02/09/13 0445  LIPASE 89* 42   No results found for this basename: AMMONIA,  in the last 168 hours CBC:  Recent Labs Lab 02/07/13 1530 02/08/13 0430 02/08/13 0930 02/09/13 0445 02/09/13 0918 02/11/13 0114  WBC 14.9* 16.3*  --  12.8*  --   --   NEUTROABS 10.5*  --   --   --   --   --   HGB 6.7* 6.8* 7.9* 6.2* 7.1* 6.1*  HCT 18.2* 18.6* 21.2* 16.8* 18.8* 17.0*  MCV 85.0 83.0  --  82.4  --   --   PLT 196 191  --  169  --   --    Cardiac Enzymes: No results found for this basename: CKTOTAL, CKMB,  CKMBINDEX, TROPONINI,  in the last 168 hours BNP (last 3 results)  Recent Labs  11/20/12 1737  PROBNP 6715.0*   CBG: No results found for this basename: GLUCAP,  in the last 168 hours  Recent Results (from the past 240 hour(s))  STOOL CULTURE     Status: None   Collection Time    02/08/13  2:44 AM      Result Value Range Status   Specimen Description STOOL   Final   Special Requests NONE   Final   Culture     Final   Value: NO SUSPICIOUS COLONIES, CONTINUING TO HOLD     Performed at [facility]   Report Status PENDING   Incomplete  URINE CULTURE     Status: None    Collection Time    02/08/13  2:44 AM      Result Value Range Status   Specimen Description URINE, CLEAN CATCH   Final   Special Requests NONE   Final   Culture  Setup Time     Final   Value: 02/08/2013 15:52     Performed at [place]     Final   Value: NO GROWTH     Performed at [facility]   Culture     Final   Value: NO GROWTH     Performed at [facility]   Report Status 02/09/2013 FINAL   Final     Studies: No results found.  Scheduled Meds: . allopurinol  100 mg Oral Daily  . aspirin  81 mg Oral Daily  . carvedilol  25 mg Oral BID WC  . enoxaparin (LOVENOX) injection  40 mg Subcutaneous Q24H  . hydrALAZINE  12.5 mg Oral TID  . isosorbide mononitrate  30 mg Oral Daily  . pantoprazole  40 mg Oral Daily  . predniSONE  40 mg Oral Q breakfast  . saccharomyces boulardii  250 mg Oral BID  . sodium chloride  3 mL Intravenous Q12H   Continuous Infusions:   Principal Problem:   Nausea vomiting and diarrhea Active Problems:   HTN (hypertension)   CKD (chronic kidney disease), stage III   Chronic systolic heart failure   Sickle cell anemia   Pancreatitis, acute   Cholelithiasis   Leukocytosis, unspecified    Time spent: >35 mins    [place] [facility] Pager [phone]. If 7PM-7AM, please contact night-coverage at [email or web address], password [facility] 02/11/2013, 5:20 PM  LOS: 4 days

## 2013-02-11 NOTE — Progress Notes (Signed)
CRITICAL VALUE ALERT  Critical value received:  6.1 hemoglobin  Date of notification:  02/11/13  Time of notification:  0147  Critical value read back:yes  Nurse who received alert:  Kathyrn Drown RN  MD notified (1st page):  Forrest Moron NP  Time of first page:  0154  MD notified (2nd page):Karen Baltazar Najjar NP  Time of second page:0225  Responding MD:  Forrest Moron NP  Time MD responded:  [id number]

## 2013-02-12 DIAGNOSIS — I5032 Chronic diastolic (congestive) heart failure: Secondary | ICD-10-CM

## 2013-02-12 DIAGNOSIS — K5289 Other specified noninfective gastroenteritis and colitis: Secondary | ICD-10-CM

## 2013-02-12 LAB — TYPE AND SCREEN
ABO/RH(D): AB POS
Antibody Screen: NEGATIVE
Unit division: 0
Unit division: 0

## 2013-02-12 LAB — BASIC METABOLIC PANEL
BUN: 55 mg/dL — ABNORMAL HIGH (ref 6–23)
CO2: 18 mEq/L — ABNORMAL LOW (ref 19–32)
Calcium: 8.6 mg/dL (ref 8.4–10.5)
Chloride: 106 mEq/L (ref 96–112)
Creatinine, Ser: 1.94 mg/dL — ABNORMAL HIGH (ref 0.50–1.35)
GFR calc Af Amer: 43 mL/min — ABNORMAL LOW (ref >90)
GFR calc non Af Amer: 37 mL/min — ABNORMAL LOW (ref >90)
Glucose, Bld: 96 mg/dL (ref 70–99)
Potassium: 5.1 mEq/L (ref 3.5–5.1)
Sodium: 133 mEq/L — ABNORMAL LOW (ref 135–145)

## 2013-02-12 LAB — STOOL CULTURE

## 2013-02-12 LAB — CBC
HCT: 19.4 % — ABNORMAL LOW (ref 39.0–52.0)
Hemoglobin: 7.1 g/dL — ABNORMAL LOW (ref 13.0–17.0)
MCH: 30 pg (ref 26.0–34.0)
MCHC: 36.6 g/dL — ABNORMAL HIGH (ref 30.0–36.0)
MCV: 81.9 fL (ref 78.0–100.0)
Platelets: 154 10*3/uL (ref 150–400)
RBC: 2.37 MIL/uL — ABNORMAL LOW (ref 4.22–5.81)
RDW: 19.3 % — ABNORMAL HIGH (ref 11.5–15.5)
WBC: 10.8 10*3/uL — ABNORMAL HIGH (ref 4.0–10.5)

## 2013-02-12 MED ORDER — FUROSEMIDE 40 MG PO TABS: 60.0000 mg | ORAL_TABLET | Freq: Every day | ORAL | Status: DC

## 2013-02-12 MED ORDER — SACCHAROMYCES BOULARDII 250 MG PO CAPS: 250.0000 mg | ORAL_CAPSULE | Freq: Two times a day (BID) | ORAL | Status: DC

## 2013-02-12 MED ORDER — PREDNISONE 20 MG PO TABS: 40.0000 mg | ORAL_TABLET | Freq: Every day | ORAL | Status: AC

## 2013-02-12 NOTE — Discharge Summary (Signed)
Physician Discharge Summary  WITOLD HENRIQUES [id number] DOB: Jun 01, 1956 DOA: 02/07/2013  PCP: Kevan Ny, MD  Admit date: 02/07/2013 Discharge date: 02/12/2013  Time spent: 65 minutes  Recommendations for Outpatient Follow-up:  1. Follow up with PCP in 1 week. Will need CBC and BMET on follow up. 2. Follow up with Dr Haroldine Laws as scheduled. 3. Follow up with Dr. Barry Dienes in 2 weeks in reference to gallstones.  Discharge Diagnoses:  Principal Problem:   Nausea vomiting and diarrhea Active Problems:   HTN (hypertension)   CKD (chronic kidney disease), stage III   Chronic systolic heart failure   Sickle cell anemia   Pancreatitis, acute   Cholelithiasis   Leukocytosis, unspecified   Discharge Condition: stable  Diet recommendation: heart healthy diet  Filed Weights   02/09/13 2013 02/10/13 2113 02/11/13 2014  Weight: 67.858 kg (149 lb 9.6 oz) 67.858 kg (149 lb 9.6 oz) 67.858 kg (149 lb 9.6 oz)    History of present illness:  Patrick [name] is a 57 y.o. male sickle cell disease, CHF, CKD(last creatinine 2.1 in June 2014) who presents with above complaints as well as generalized weakness. He states that he was in his usual state of health until today when he began having some lower abdominal pain associated with nausea vomiting and diarrhea. She describes the abdominal pain as- severe, 10 out of 10 in intensity, constant, was relieved by IV narcotics in the ED. He states he had diarrhea x4 initially just loose and then became watery, nonbloody. He admits to multiple episodes of nonbloody emesis. He denies fevers, dysuria, cough and no chest pain. He also denies any sick contacts and also denies eating any suspicious foods. He was seen in the ED and CT scan of his abdomen and pelvis done which revealed cholelithiasis and fluid filled distal colon consistent with history of diarrhea. His LFTs/bilirubin is mildly elevated but about the same as in June of 2014. His lipase 89. UA  ordered in ED but not collected. WBC 14.9, Hgb 6.7 (he states his hemoglobin usually runs about 8). He is admitted for further evaluation and management.  He denies any back leg or chest pain like he usually has with his sickle cell crisis   Hospital Course:  #1 nausea/vomiting/diarrhea/low abdominal pain/probable gastroenteritis  Patient had presented with nausea, emesis, diarrhea. Stool studies were obtained which were negative to date. Patient was hydrated with IVF and placed on supportive care. Patient improved clinically and diet advanced. It was felt patient likely had a viral gastroenteritis. Patient tolerating diet, and symptoms resolved by day of discharge.  #2 chemical pancreatitis  Lipase levels have normalized. No abdominal pain. Tolerated current diet.  #3 cholelithiasis  Asymptomatic. No significant change in LFTs from June of 2014. Patient was seen in consultation by general surgery and it was felt patient symptoms were more secondary to gastroenteritis. It was recommended by general surgery that patient follow up as out patient for further evaluation and probable elective cholecystectomy as an outpatient. #4 acute on chronic kidney disease stage III  Likely secondary to prerenal azotemia secondary to GI losses. Patient's diuretics were held during the hospitalization and patient is to resume diuretics in 2-3 days post discharge. Patient renal function improved and creatinine trended down from 2.71 to 1.94 by day of discharge. Patient will follow up as outpatient. #5 chronic systolic CHF  Stable. Diuretics were held. Remained compensated throughout the hospitalization. Patient is to resume diuretics 2-3 days post discharge. #6 sickle cell anemia  Baseline hemoglobin approximately 8 per patient. Patient had been transfused one unit of packed red blood cells. Repeat hemoglobin during hospitalization was 6.1. Patient was transfused 2 more units of packed red blood cells. Hgb on day of  discharge was 7.1. No overt GIB noted. Follow up as outpatient. #7 leukocytosis  Likely reactive leukocytosis. Stool studies negative.WBC trending down.  #8 left wrist gout flare  Patient was noted to have a gouty attack of the left wrist. Patient was placed on a short burst of steriods with significant improvement. Patient will be discharged on 2 more days of oral prednisone to complete 5 day course. #9 probable UTI  Status post 3 days IV Rocephin. Follow.   Procedures: CT abd/pelvis 02/07/13  [place] 02/07/13   Consultations: Dr. Barry Dienes 02/08/2013   Discharge Exam: Filed Vitals:   02/12/13 1231  BP: 121/73  Pulse: 76  Temp: 98.4 F (36.9 C)  Resp: 17    General: NAD Cardiovascular: RRR Respiratory: CTAB  Discharge Instructions  Discharge Orders   Future Appointments Provider Department Dept Phone   02/17/2013 3:15 PM [facility] [place] [phone]   02/17/2013 3:20 PM [place] [phone]   Future Orders Complete By Expires   Diet - low sodium heart healthy  As directed    Discharge instructions  As directed    Comments:     Follow up with PCP in 1 week. Follow up with general surgery in 2 weeks about gallstones.   Increase activity slowly  As directed        Medication List         allopurinol 100 MG tablet  Commonly known as:  ZYLOPRIM  Take 1 tablet (100 mg total) by mouth daily.     aspirin 81 MG tablet  Take 81 mg by mouth daily.     carvedilol 25 MG tablet  Commonly known as:  COREG  Take 1 tablet (25 mg total) by mouth 2 (two) times daily with a meal.     cholecalciferol 1000 UNITS tablet  Commonly known as:  VITAMIN D  Take 1,000 Units by mouth daily.     furosemide 40 MG tablet  Commonly known as:  LASIX  Take 1.5 tablets (60 mg total) by mouth daily. Resume in 2 days.  Start taking on:  02/14/2013     hydrALAZINE 25 MG tablet   Commonly known as:  APRESOLINE  Take 0.5 tablets (12.5 mg total) by mouth 3 (three) times daily.     isosorbide mononitrate 30 MG 24 hr tablet  Commonly known as:  IMDUR  Take 1 tablet (30 mg total) by mouth daily.     predniSONE 20 MG tablet  Commonly known as:  DELTASONE  Take 2 tablets (40 mg total) by mouth daily with breakfast. Take for 2 days then stop.     saccharomyces boulardii 250 MG capsule  Commonly known as:  FLORASTOR  Take 1 capsule (250 mg total) by mouth 2 (two) times daily.       No Known Allergies     Follow-up Information   Follow up with MATTHEWS,MICHELLE A., MD. Schedule an appointment as soon as possible for a visit in 1 week.   Specialty:  Internal Medicine   Contact information:   [address] [place] [address] [phone]       Follow up with Stark Klein, MD. Schedule an appointment as soon as possible  for a visit in 2 weeks. (f/u on gallstones)    Specialty:  General Surgery   Contact information:   [place] [address] [phone]       Follow up with Glori Bickers, MD. (f/u as scheduled.)    Specialty:  Cardiology   Contact information:   [address] [place]  [address] [phone]        The results of significant diagnostics from this hospitalization (including imaging, microbiology, ancillary and laboratory) are listed below for reference.    Significant Diagnostic Studies: Ct Abdomen Pelvis Wo Contrast  02/07/2013   *RADIOLOGY REPORT*  Clinical Data: Nausea, vomiting, diarrhea, syncope, left lower quadrant pain, history chronic diastolic heart failure, chronic kidney disease stage III, gout, sickle cell anemia  CT ABDOMEN AND PELVIS WITHOUT CONTRAST  Technique:  Multidetector CT imaging of the abdomen and pelvis was performed following the standard protocol without intravenous contrast. Sagittal and coronal MPR images reconstructed from axial data set.   Comparison: 06/08/2011  Findings: Lung bases clear. Enlargement of cardiac chambers. Low attenuation foci at left kidney likely small cysts. Calcified gallstones up to the 18 mm diameter. Liver, pancreas, kidneys, and adrenal glands otherwise normal appearance. Small calcified splenic remnant post auto infarction under left diaphragm.  Fluid-filled colon distally consistent with the area of diarrhea. Majority of the colon is unopacified, some areas demonstrating normal wall thickness with remaining areas limited in assessment of wall thickness. Stomach and bowel loops otherwise normal appearance. Normal appendix. No mass, adenopathy, free fluid inflammatory process. Unremarkable bladder and ureters. Osseous changes of sickle cell disease.  IMPRESSION: Cholelithiasis. Enlargement of cardiac chambers. Fluid-filled distal colon consistent with history of diarrhea. Otherwise negative exam.   Original Report Authenticated By: Lavonia Dana, M.D.   US Abdomen Complete  02/07/2013   *RADIOLOGY REPORT*  Clinical Data:  Abdominal pain, nausea, vomiting, diarrhea.  ABDOMINAL ULTRASOUND COMPLETE  Comparison:  CT from today.  Findings:  Gallbladder:  Moderate cholelithiasis without wall thickening or pericholecystic fluid.  Negative sonographic Murphy's sign.  Common Bile Duct:  Within normal measuring 3-4.3 mm.  Liver: No focal mass lesion identified.  Within normal limits in parenchymal echogenicity.  IVC:  Not visualized.  Pancreas:  No abnormality identified.  Spleen:  Not visualized.  Right kidney:  Measures 11.4 cm in diameter with increased cortical echogenicity.  Left kidney:  Measures 12.2 cm in diameter with increased cortical echogenicity.  Several left renal cyst are present with the largest measuring 1.3 cm over the lower pole.  Abdominal Aorta:  No aneurysm identified.  Distal segment not well seen.  IMPRESSION: Moderate cholelithiasis without additional sonographic evidence to suggest cholecystitis.  Normal sized  kidneys with increased cortical echogenicity likely due to medical renal disease.  3 small left renal cysts.   Original Report Authenticated By: Marin Olp, M.D.    Microbiology: Recent Results (from the past 240 hour(s))  STOOL CULTURE     Status: None   Collection Time    02/08/13  2:44 AM      Result Value Range Status   Specimen Description STOOL   Final   Special Requests NONE   Final   Culture     Final   Value: NO SALMONELLA, SHIGELLA, CAMPYLOBACTER, YERSINIA, OR E.COLI 0157:H7 ISOLATED     Performed at [facility]   Report Status 02/12/2013 FINAL   Final  URINE CULTURE     Status: None   Collection Time  02/08/13  2:44 AM      Result Value Range Status   Specimen Description URINE, CLEAN CATCH   Final   Special Requests NONE   Final   Culture  Setup Time     Final   Value: 02/08/2013 15:52     Performed at [facility]     Final   Value: NO GROWTH     Performed at [facility]   Culture     Final   Value: NO GROWTH     Performed at [facility]   Report Status 02/09/2013 FINAL   Final     Labs: Basic Metabolic Panel:  Recent Labs Lab 02/08/13 0430 02/09/13 0445 02/10/13 0550 02/11/13 0114 02/12/13 0620  NA 135 134* 135 134* 133*  K 4.5 4.4 4.9 5.3* 5.1  CL 105 105 106 106 106  CO2 18* 19 18* 18* 18*  GLUCOSE 83 90 95 129* 96  BUN 55* 51* 51* 46* 55*  CREATININE 2.71* 2.33* 2.10* 2.09* 1.94*  CALCIUM 8.5 8.5 8.5 8.7 8.6   Liver Function Tests:  Recent Labs Lab 02/07/13 1530 02/08/13 0430 02/09/13 0445  AST 41* 37 33  ALT 11 11 9   ALKPHOS 78 70 66  BILITOT 3.0* 2.1* 2.5*  PROT 6.6 6.2 6.0  ALBUMIN 3.4* 3.1* 2.9*    Recent Labs Lab 02/07/13 1530 02/09/13 0445  LIPASE 89* 42   No results found for this basename: AMMONIA,  in the last 168 hours CBC:  Recent Labs Lab 02/07/13 1530 02/08/13 0430  02/09/13 0445 02/09/13 0918 02/11/13 0114 02/11/13 2244 02/12/13 0620  WBC 14.9*  16.3*  --  12.8*  --   --  9.5 10.8*  NEUTROABS 10.5*  --   --   --   --   --   --   --   HGB 6.7* 6.8*  < > 6.2* 7.1* 6.1* 7.6* 7.1*  HCT 18.2* 18.6*  < > 16.8* 18.8* 17.0* 20.6* 19.4*  MCV 85.0 83.0  --  82.4  --   --  82.4 81.9  PLT 196 191  --  169  --   --  164 154  < > = values in this interval not displayed. Cardiac Enzymes: No results found for this basename: CKTOTAL, CKMB, CKMBINDEX, TROPONINI,  in the last 168 hours BNP: BNP (last 3 results)  Recent Labs  11/20/12 1737  PROBNP 6715.0*   CBG: No results found for this basename: GLUCAP,  in the last 168 hours     Signed:  Gracelynne Benedict  [facility] 02/12/2013, 1:42 PM

## 2013-02-17 ENCOUNTER — Encounter (HOSPITAL_COMMUNITY): Payer: Self-pay

## 2013-02-17 ENCOUNTER — Ambulatory Visit (HOSPITAL_COMMUNITY)
Admission: RE | Admit: 2013-02-17 | Discharge: 2013-02-17 | Disposition: A | Discharge status: Home / Self Care | Payer: Self-pay | Source: Ambulatory Visit | Attending: Internal Medicine | Admitting: Internal Medicine

## 2013-02-17 VITALS — BP 140/72 | HR 96 | Wt 161.4 lb

## 2013-02-17 DIAGNOSIS — E875 Hyperkalemia: Secondary | ICD-10-CM | POA: Insufficient documentation

## 2013-02-17 DIAGNOSIS — M109 Gout, unspecified: Secondary | ICD-10-CM | POA: Insufficient documentation

## 2013-02-17 DIAGNOSIS — H548 Legal blindness, as defined in USA: Secondary | ICD-10-CM | POA: Insufficient documentation

## 2013-02-17 DIAGNOSIS — D571 Sickle-cell disease without crisis: Secondary | ICD-10-CM | POA: Insufficient documentation

## 2013-02-17 DIAGNOSIS — K8020 Calculus of gallbladder without cholecystitis without obstruction: Secondary | ICD-10-CM | POA: Insufficient documentation

## 2013-02-17 DIAGNOSIS — I509 Heart failure, unspecified: Secondary | ICD-10-CM | POA: Insufficient documentation

## 2013-02-17 DIAGNOSIS — I5020 Unspecified systolic (congestive) heart failure: Secondary | ICD-10-CM | POA: Insufficient documentation

## 2013-02-17 DIAGNOSIS — IMO0002 Reserved for concepts with insufficient information to code with codable children: Secondary | ICD-10-CM | POA: Insufficient documentation

## 2013-02-17 DIAGNOSIS — F17200 Nicotine dependence, unspecified, uncomplicated: Secondary | ICD-10-CM | POA: Insufficient documentation

## 2013-02-17 DIAGNOSIS — I2510 Atherosclerotic heart disease of native coronary artery without angina pectoris: Secondary | ICD-10-CM | POA: Insufficient documentation

## 2013-02-17 DIAGNOSIS — N183 Chronic kidney disease, stage 3 unspecified: Secondary | ICD-10-CM | POA: Insufficient documentation

## 2013-02-17 DIAGNOSIS — Z7982 Long term (current) use of aspirin: Secondary | ICD-10-CM | POA: Insufficient documentation

## 2013-02-17 DIAGNOSIS — I428 Other cardiomyopathies: Secondary | ICD-10-CM | POA: Insufficient documentation

## 2013-02-17 DIAGNOSIS — I5022 Chronic systolic (congestive) heart failure: Secondary | ICD-10-CM

## 2013-02-17 DIAGNOSIS — E860 Dehydration: Secondary | ICD-10-CM | POA: Insufficient documentation

## 2013-02-17 MED ORDER — ISOSORBIDE MONONITRATE ER 30 MG PO TB24: 30.0000 mg | ORAL_TABLET | Freq: Every day | ORAL | Status: DC

## 2013-02-17 NOTE — Progress Notes (Signed)
Patient ID: Patrick [name], male   DOB: 04/25/1956, 57 y.o.   MRN: [id number]    Weight Range    145-148 pounds  Baseline proBNP   993.1 on 01/19/12  PCP: [name] : None Opthalmologist: [name]  [facility] Trial   HPI: Patrick [name] is a 57 y.o. African American male with a history of  sickle cell anemia, systolic HF due to NICM EF 30-35% diagnosed 05/2011, no CAD by cath 05/2011, CKD stage III, legally blind, and tobacco abuse.   01/17/12 ECHO 55%-60%  11/20/12 ECHO EF  40% Peak PA pressure 56  Readmitted 11/18/12 from HF clinic with voume overload. Hemoglobin 6.7 Transfused 2 UPRBC. Diuresed with IV lasix. Lisinopril stopped due to CKD. Discharge weight 143 pounds.    Admitted 02/07/13 through 02/12/13 after being treated for N/V/D, and abdominal pain. CT of abdomen- cholelithias with plans to f/u for elective cholecystectomy. Received 2UPRBC.  Hemoglobin 6.1>7.1.  Diuretics held due to dehydration. He restarted lasix 60 mg bid 02/13/13.   He returns for follow up today with his girlfriend. Last visit hydralazine 12.5mg  tid and imdur 15 mg daily added. Denies SOB/PND/Orthopnea. Sleeps on 2 pillows out of habit.  Able to walk up steps. He says his vision is no worse.  He has not weighed since he got out of the hospital. He has not required any additional lasix. Appetite ok. Compliant with medications. Medicaid pending.   He is not on Sprironolactone due to hyperkalemia. He is not on ace inhibitor due to renal failure.     ROS: All systems negative except as listed in HPI, PMH and Problem List.  Past Medical History  Diagnosis Date  . Swelling of extremity, right   . Swelling abdomen   . Swelling of extremity, left   . CHF (congestive heart failure)   . Blood transfusion     "today is the second time I've had a blood transfusion" (11/21/2012)  . Sickle cell anemia   . Blood dyscrasia   . Exertional shortness of breath   . Legally blind     Current Outpatient Prescriptions   Medication Sig Dispense Refill  . allopurinol (ZYLOPRIM) 100 MG tablet Take 1 tablet (100 mg total) by mouth daily.  30 tablet  12  . aspirin 81 MG tablet Take 81 mg by mouth daily.      . carvedilol (COREG) 25 MG tablet Take 1 tablet (25 mg total) by mouth 2 (two) times daily with a meal.  60 tablet  6  . cholecalciferol (VITAMIN D) 1000 UNITS tablet Take 1,000 Units by mouth daily.      . furosemide (LASIX) 40 MG tablet Take 1.5 tablets (60 mg total) by mouth daily. Resume in 2 days.  40 tablet  3  . hydrALAZINE (APRESOLINE) 25 MG tablet Take 0.5 tablets (12.5 mg total) by mouth 3 (three) times daily.  45 tablet  3  . lisinopril (PRINIVIL,ZESTRIL) 10 MG tablet Take 5 mg by mouth daily.      . predniSONE (DELTASONE) 20 MG tablet Take 20 mg by mouth daily.      Marland Kitchen saccharomyces boulardii (FLORASTOR) 250 MG capsule Take 1 capsule (250 mg total) by mouth 2 (two) times daily.       No current facility-administered medications for this encounter.     PHYSICAL EXAM: Filed Vitals:   02/17/13 1537  BP: 140/72  Pulse: 96  Weight: 161 lb 6.4 oz (73.211 kg)  SpO2: 98%  Weight 152>161  General:  Well appearing.  No resp difficulty with his girlfriend HEENT: normal except for mild scleral icterus Neck: supple. JVP to jaw +prominent CV waves. Carotids 2+ bilaterally; no bruits. No lymphadenopathy or thryomegaly appreciated. Cor: PMI normal Regular rate & rhythm. No rubs, gallops 2/6 TR and 2/6 MR Lungs: clear    Abdomen: soft, nontender, mildly distented. No hepatosplenomegaly. No bruits or masses. Good bowel sounds. Extremities: no cyanosis, rash, edema ,  + clubbing. Neuro: alert & orientedx3, cranial nerves grossly intact. Moves all 4 extremities w/o difficulty. Affect pleasant.   ASSESSMENT & PLAN: 1. Chronic Systolic Heart Failure -NICM  Cath 05/2011 ECHO 11/20/12 EF 40% (previous 55% 01/2012). NYHA I-II. Baseline weight is 145-148 pounds.  Volume status up 10 pounds. Continue lasix 60 mg  daily. Instructed to take 2.5 mg of Metolazone today.  On goal dose of carvedilol 25 mg twice a day Add hydralazine 12.5 mg tid. Will reorder Imdur 30 mg daily He is not on Ace/Arb/spiro due to hyperkalemia/ CKD Reinforced daily weights, low salt food choices, and limiting fluid intake to , 2 liters perday Will need repeat ECHO in a few months.  Check BMET/Pro BNP today  2. Sickle Cell Anemia Instructed to set up follow up appointment at [facility] with Dr Patrick [name]  3. Gout He has gout flares off medicine. Continue allopurinol 100 mg daily. If remains uncontrolled will need to refer to Dr Patrick [name].   4. Cholelithiasis  Follow up with general surgeon   Follow up in 2 weeks to reassess volume status.   [name],[name] 4:20 PM  Patient seen and examined with Patrick Grinder, NP. We discussed all aspects of the encounter. I agree with the assessment and plan as stated above. HF is relatively stable. Volume status mildly elevated. Will give dose of metolazone. Reinforced need for daily weights and reviewed use of sliding scale diuretics. Will see him back in 2 weeks to reassess.   Patrick Brook,MD 10:40 PM

## 2013-02-17 NOTE — Patient Instructions (Addendum)
Follow up in 2 weeks  Take Imdur 30 mg daily  Do the following things EVERYDAY: 1) Weigh yourself in the morning before breakfast. Write it down and keep it in a log. 2) Take your medicines as prescribed 3) Eat low salt foods-Limit salt (sodium) to 2000 mg per day.  4) Stay as active as you can everyday 5) Limit all fluids for the day to less than 2 liters

## 2013-02-20 ENCOUNTER — Encounter: Payer: Self-pay | Admitting: Internal Medicine

## 2013-02-26 ENCOUNTER — Ambulatory Visit: Payer: Medicaid Other | Admitting: Internal Medicine

## 2013-03-09 ENCOUNTER — Ambulatory Visit (HOSPITAL_COMMUNITY)
Admission: RE | Admit: 2013-03-09 | Discharge: 2013-03-09 | Disposition: A | Discharge status: Home / Self Care | Payer: Self-pay | Source: Ambulatory Visit | Attending: Internal Medicine | Admitting: Internal Medicine

## 2013-03-09 VITALS — BP 134/76 | HR 74 | Wt 151.8 lb

## 2013-03-09 DIAGNOSIS — K8020 Calculus of gallbladder without cholecystitis without obstruction: Secondary | ICD-10-CM | POA: Insufficient documentation

## 2013-03-09 DIAGNOSIS — Z720 Tobacco use: Secondary | ICD-10-CM

## 2013-03-09 DIAGNOSIS — Z79899 Other long term (current) drug therapy: Secondary | ICD-10-CM | POA: Insufficient documentation

## 2013-03-09 DIAGNOSIS — I5022 Chronic systolic (congestive) heart failure: Secondary | ICD-10-CM | POA: Insufficient documentation

## 2013-03-09 DIAGNOSIS — F17200 Nicotine dependence, unspecified, uncomplicated: Secondary | ICD-10-CM | POA: Insufficient documentation

## 2013-03-09 DIAGNOSIS — H548 Legal blindness, as defined in USA: Secondary | ICD-10-CM | POA: Insufficient documentation

## 2013-03-09 DIAGNOSIS — I10 Essential (primary) hypertension: Secondary | ICD-10-CM

## 2013-03-09 DIAGNOSIS — D571 Sickle-cell disease without crisis: Secondary | ICD-10-CM | POA: Insufficient documentation

## 2013-03-09 DIAGNOSIS — N183 Chronic kidney disease, stage 3 unspecified: Secondary | ICD-10-CM | POA: Insufficient documentation

## 2013-03-09 DIAGNOSIS — Z7982 Long term (current) use of aspirin: Secondary | ICD-10-CM | POA: Insufficient documentation

## 2013-03-09 DIAGNOSIS — I129 Hypertensive chronic kidney disease with stage 1 through stage 4 chronic kidney disease, or unspecified chronic kidney disease: Secondary | ICD-10-CM | POA: Insufficient documentation

## 2013-03-09 DIAGNOSIS — IMO0002 Reserved for concepts with insufficient information to code with codable children: Secondary | ICD-10-CM | POA: Insufficient documentation

## 2013-03-09 DIAGNOSIS — M109 Gout, unspecified: Secondary | ICD-10-CM | POA: Insufficient documentation

## 2013-03-09 MED ORDER — HYDRALAZINE HCL 25 MG PO TABS: 25.0000 mg | ORAL_TABLET | Freq: Three times a day (TID) | ORAL | Status: DC

## 2013-03-09 NOTE — Patient Instructions (Addendum)
Increase hydralazine to 25 mg three times a day. Call if you notice any dizziness.  Follow up in 6 weeks  Labs in 2 weeks with [facility]  Do the following things EVERYDAY: 1) Weigh yourself in the morning before breakfast. Write it down and keep it in a log. 2) Take your medicines as prescribed 3) Eat low salt foods-Limit salt (sodium) to 2000 mg per day.  4) Stay as active as you can everyday 5) Limit all fluids for the day to less than 2 liters

## 2013-03-09 NOTE — Progress Notes (Addendum)
Patient ID: Patrick [name], male   DOB: 07/06/1955, 57 y.o.   MRN: [id number]  Weight Range    145-148 pounds  Baseline proBNP   993.1 on 01/19/12  PCP: [name] : None Opthalmologist: [name]  [facility] Trial   HPI: [name] is a 57 y.o. African American male with a history of  sickle cell anemia, systolic HF due to NICM EF 30-35% diagnosed 05/2011, no CAD by cath 05/2011, CKD stage III, legally blind, and tobacco abuse.   01/17/12 ECHO 55%-60%  11/20/12 ECHO EF  40% Peak PA pressure 56  Readmitted 11/18/12 from HF clinic with voume overload. Hemoglobin 6.7 Transfused 2 UPRBC. Diuresed with IV lasix. Lisinopril stopped due to CKD. Discharge weight 143 pounds.    Admitted 02/07/13 through 02/12/13 after being treated for N/V/D, and abdominal pain. CT of abdomen- cholelithias with plans to f/u for elective cholecystectomy. Received 2UPRBC.  Hemoglobin 6.1>7.1.  Diuretics held due to dehydration. He restarted lasix 60 mg bid 02/13/13.   Labs        02/17/13: K+ 5.0, BUN 45, Creatinine 2.2, proBNP 10135  Follow up:  Last visit patient took metolazone 2.5 mg. [facility] Tuesday. Denies SOB/PND/Orthopnea or CP. Mild edema. Reports feeling good.  Sleeps on 2 pillows out of habit.  Able to walk up steps. Weight at home 152 lbs. He has not required any additional lasix. Compliant with medications. Medicaid pending. Still smoking about 4 cigarettes a day.   He is not on Sprironolactone due to hyperkalemia. He is not on ace inhibitor due to renal failure.     SH: Lives with Patrick [name] and cousin in [place].  ROS: All systems negative except as listed in HPI, PMH and Problem List.  Past Medical History  Diagnosis Date  . Swelling of extremity, right   . Swelling abdomen   . Swelling of extremity, left   . CHF (congestive heart failure)   . Blood transfusion     "today is the second time I've had a blood transfusion" (11/21/2012)  . Sickle cell anemia   .  Blood dyscrasia   . Exertional shortness of breath   . Legally blind     Current Outpatient Prescriptions  Medication Sig Dispense Refill  . allopurinol (ZYLOPRIM) 100 MG tablet Take 1 tablet (100 mg total) by mouth daily.  30 tablet  12  . aspirin 81 MG tablet Take 81 mg by mouth daily.      . carvedilol (COREG) 25 MG tablet Take 1 tablet (25 mg total) by mouth 2 (two) times daily with a meal.  60 tablet  6  . cholecalciferol (VITAMIN D) 1000 UNITS tablet Take 1,000 Units by mouth daily.      . furosemide (LASIX) 40 MG tablet Take 60 mg by mouth daily.      . hydrALAZINE (APRESOLINE) 25 MG tablet Take 0.5 tablets (12.5 mg total) by mouth 3 (three) times daily.  45 tablet  3  . isosorbide mononitrate (IMDUR) 30 MG 24 hr tablet Take 1 tablet (30 mg total) by mouth daily.  30 tablet  3  . predniSONE (DELTASONE) 20 MG tablet Take 20 mg by mouth daily.      Marland Kitchen saccharomyces boulardii (FLORASTOR) 250 MG capsule Take 1 capsule (250 mg total) by mouth 2 (two) times daily.       No current facility-administered medications for this encounter.    Filed Vitals:   03/09/13 1508  BP: 134/76  Pulse: 74  Weight: 151 lb 12 oz (68.833 kg)  SpO2: 98%    PHYSICAL EXAM: General:  Well appearing.  No resp difficulty with his girlfriend HEENT: normal except for mild scleral icterus Neck: supple. JVP to jaw +prominent CV waves. Carotids 2+ bilaterally; no bruits. No lymphadenopathy or thryomegaly appreciated. Cor: PMI normal Regular rate & rhythm. No rubs, gallops 2/6 TR and 2/6 MR Lungs: clear    Abdomen: soft, nontender, mildly distented. No hepatosplenomegaly. No bruits or masses. Good bowel sounds. Extremities: no cyanosis, rash. TR edema ,  + clubbing. Neuro: alert & orientedx3, cranial nerves grossly intact. Moves all 4 extremities w/o difficulty. Affect pleasant.   ASSESSMENT & PLAN: 1. Chronic Systolic Heart Failure -NICM  Cath 05/2011 ECHO 11/20/12 EF 40% (previous 55% 01/2012). - NYHA II  symptoms. Volume status stable, continue lasix 60 mg daily - Continue coreg 25 mg BID. - Increase hydralazine 25 mg tid and continue IMDUR 30 mg daily. - He is not on Ace/Arb/spiro due to hyperkalemia/ CKD - Reinforced daily weights, low salt food choices, and limiting fluid intake to , 2 liters perday - Check BMET/Pro BNP today with [facility]  2. Sickle Cell Anemia Follow up appt with Dr. Zigmund Jonan Seufert at [facility] next week  3. Gout - Continue allopurinol 100 mg daily. If remains uncontrolled will need to refer to Dr Ouida Sills.   4. Cholelithiasis  - Follow up with general surgeon   Follow up 6 weeks  Junie Bame B NP-C 3:16 PM  Patient seen and examined with Junie Bame, NP. We discussed all aspects of the encounter. I agree with the assessment and plan as stated above. Doing well from a HF standpoint. Agree with med changes as above. Reinforced need for daily weights and reviewed use of sliding scale diuretics. Check labs as part of GUIDE-IT trial.   [name] 10:12 PM

## 2013-03-17 ENCOUNTER — Ambulatory Visit (INDEPENDENT_AMBULATORY_CARE_PROVIDER_SITE_OTHER): Payer: Self-pay | Admitting: Internal Medicine

## 2013-03-17 ENCOUNTER — Encounter: Payer: Self-pay | Admitting: Internal Medicine

## 2013-03-17 VITALS — BP 151/80 | HR 98 | Temp 98.2°F | Resp 16 | Ht 70.5 in | Wt 150.0 lb

## 2013-03-17 DIAGNOSIS — M109 Gout, unspecified: Secondary | ICD-10-CM

## 2013-03-17 DIAGNOSIS — Z23 Encounter for immunization: Secondary | ICD-10-CM

## 2013-03-17 DIAGNOSIS — F17200 Nicotine dependence, unspecified, uncomplicated: Secondary | ICD-10-CM

## 2013-03-17 DIAGNOSIS — N184 Chronic kidney disease, stage 4 (severe): Secondary | ICD-10-CM

## 2013-03-17 DIAGNOSIS — I5042 Chronic combined systolic (congestive) and diastolic (congestive) heart failure: Secondary | ICD-10-CM

## 2013-03-17 DIAGNOSIS — D571 Sickle-cell disease without crisis: Secondary | ICD-10-CM

## 2013-03-17 DIAGNOSIS — H3500 Unspecified background retinopathy: Secondary | ICD-10-CM

## 2013-03-17 MED ORDER — FOLIC ACID 1 MG PO TABS: 1.0000 mg | ORAL_TABLET | Freq: Every day | ORAL | Status: DC

## 2013-03-17 NOTE — Progress Notes (Signed)
Subjective:    Patient ID: Patrick [name], male    DOB: 21-Nov-1955, 57 y.o.   MRN: [id number]  HPI: Pt with Hb SS and multiple medical problems here to establish care. He states that he has been feeling well and his only issue with his SCD is that his Hb has been going low for the last 2 years and thus requiring transfusions. He has eye problems which sounds like a retinopathy as he was told by the Opthalmologist that his vision changes were related to Sickle Cell Disease. He was given an intraocular injection when last evaluated for vision which was about 1 year ago. Pt has not been on Folic acid for more than 2 years and has never taken Hydrea.   He also has a hisitory of Gout and is maintained on Allopurinol. His last flare was 1 month ago when he was treated with a course of Prednisone.  Pt also has chronic kidney disease with a GFR of 35. He is not on ACE-I because of according to him worsening Kidney function. He has never been evaluated by Nephrology.  Pt also has Chronic Systolic and diastolic dysfunction which is managed by Dr. Haroldine Laws.     Review of Systems  Constitutional: Negative.   HENT: Negative.   Eyes: Positive for visual disturbance.  Respiratory: Negative.  Negative for shortness of breath and wheezing.   Cardiovascular: Negative.   Gastrointestinal: Negative.   Endocrine: Negative.   Genitourinary: Negative.   Musculoskeletal: Negative for myalgias and arthralgias.  Skin: Negative.   Allergic/Immunologic: Negative.   Neurological: Negative.   Hematological: Negative.   Psychiatric/Behavioral: Negative.        Objective:   Physical Exam  Constitutional: He is oriented to person, place, and time. He appears well-developed and well-nourished.  HENT:  Head: Atraumatic.  Eyes: Conjunctivae and EOM are normal. Pupils are equal, round, and reactive to light. No scleral icterus.  Neck: Normal range of motion. Neck supple.  Cardiovascular: Normal rate and  regular rhythm.  Exam reveals no gallop and no friction rub.   No murmur heard. Pulmonary/Chest: Effort normal and breath sounds normal. He has no wheezes. He has no rales. He exhibits no tenderness.  Abdominal: Soft. Bowel sounds are normal. He exhibits no mass.  Musculoskeletal: Normal range of motion.  Neurological: He is alert and oriented to person, place, and time.  Skin: Skin is warm and dry. Rash (lichenified rash which has been present for years) noted.  Psychiatric: He has a normal mood and affect. His behavior is normal. Judgment and thought content normal.          Assessment & Plan:  1. Hb SS: Pt states that he has genotype SS.: His electrophoresis from 10/23/2011 shows findings consistent with Hb SS with transfusions. He states that he has had multiple transfusions as a child up to a certain age then he went without need for transfusions up until  The last 2 years. He states that he has required 8 transfusions in the last 2 years.  He has never taken Hydrea and has not been taking Folic acid for several years. He has retinopathy and states that his vision is such that he is unable to read or drive. He was seen by Opthamolology about 1 year ago when he was told that he had retinopathy and needed a shot. Pt does not want to see that opthalmologist again.   2. Tobacco Use Disorder: Pt counseled on smoking cessation  3. Gout: Check Uric  Acid levels. Continue Allopurinol  4. Chronic Congestive heart failure: Defer to Dr. Haroldine Laws. Pt not on ACE-I  Per Dr. Haroldine Laws.

## 2013-03-18 ENCOUNTER — Other Ambulatory Visit (HOSPITAL_BASED_OUTPATIENT_CLINIC_OR_DEPARTMENT_OTHER): Payer: Self-pay | Admitting: *Deleted

## 2013-03-18 ENCOUNTER — Telehealth: Payer: Self-pay | Admitting: Internal Medicine

## 2013-03-18 ENCOUNTER — Other Ambulatory Visit: Payer: Self-pay | Admitting: Primary Care

## 2013-03-18 ENCOUNTER — Other Ambulatory Visit: Payer: Self-pay | Admitting: Internal Medicine

## 2013-03-18 DIAGNOSIS — N184 Chronic kidney disease, stage 4 (severe): Secondary | ICD-10-CM

## 2013-03-18 DIAGNOSIS — D571 Sickle-cell disease without crisis: Secondary | ICD-10-CM

## 2013-03-18 DIAGNOSIS — M109 Gout, unspecified: Secondary | ICD-10-CM

## 2013-03-18 LAB — URIC ACID: Uric Acid, Serum: 10.6 mg/dL — ABNORMAL HIGH (ref 4.0–7.8)

## 2013-03-18 LAB — CBC WITH DIFFERENTIAL/PLATELET
Basophils Absolute: 0.1 10*3/uL (ref 0.0–0.1)
Basophils Relative: 1 % (ref 0–1)
Eosinophils Absolute: 0.4 10*3/uL (ref 0.0–0.7)
Eosinophils Relative: 4 % (ref 0–5)
HCT: 19.1 % — ABNORMAL LOW (ref 39.0–52.0)
Hemoglobin: 7 g/dL — ABNORMAL LOW (ref 13.0–17.0)
Lymphocytes Relative: 15 % (ref 12–46)
Lymphs Abs: 1.5 10*3/uL (ref 0.7–4.0)
MCH: 30.7 pg (ref 26.0–34.0)
MCHC: 36.6 g/dL — ABNORMAL HIGH (ref 30.0–36.0)
MCV: 83.8 fL (ref 78.0–100.0)
Monocytes Absolute: 1.4 10*3/uL — ABNORMAL HIGH (ref 0.1–1.0)
Monocytes Relative: 14 % — ABNORMAL HIGH (ref 3–12)
Neutro Abs: 6.5 10*3/uL (ref 1.7–7.7)
Neutrophils Relative %: 66 % (ref 43–77)
Platelets: 200 10*3/uL (ref 150–400)
RBC: 2.28 MIL/uL — ABNORMAL LOW (ref 4.22–5.81)
RDW: 19.9 % — ABNORMAL HIGH (ref 11.5–15.5)
WBC: 9.9 10*3/uL (ref 4.0–10.5)

## 2013-03-18 LAB — COMPREHENSIVE METABOLIC PANEL
ALT: 14 U/L (ref 0–53)
AST: 35 U/L (ref 0–37)
Albumin: 3.8 g/dL (ref 3.5–5.2)
Alkaline Phosphatase: 84 U/L (ref 39–117)
BUN: 42 mg/dL — ABNORMAL HIGH (ref 6–23)
CO2: 17 mEq/L — ABNORMAL LOW (ref 19–32)
Calcium: 8.6 mg/dL (ref 8.4–10.5)
Chloride: 109 mEq/L (ref 96–112)
Creat: 1.95 mg/dL — ABNORMAL HIGH (ref 0.50–1.35)
Glucose, Bld: 96 mg/dL (ref 70–99)
Potassium: 4.7 mEq/L (ref 3.5–5.3)
Sodium: 135 mEq/L (ref 135–145)
Total Bilirubin: 2.9 mg/dL — ABNORMAL HIGH (ref 0.3–1.2)
Total Protein: 6.3 g/dL (ref 6.0–8.3)

## 2013-03-18 LAB — FERRITIN: Ferritin: 501 ng/mL — ABNORMAL HIGH (ref 22–322)

## 2013-03-18 LAB — LACTATE DEHYDROGENASE: LDH: 579 U/L — ABNORMAL HIGH (ref 94–250)

## 2013-03-18 LAB — LACTATE DEHYDROGENASE (CC13): LDH: 216 U/L (ref 125–245)

## 2013-03-18 NOTE — Progress Notes (Signed)
This encounter was created in error - please disregard.

## 2013-03-19 ENCOUNTER — Encounter: Payer: Self-pay | Admitting: Internal Medicine

## 2013-03-19 LAB — URINALYSIS, MICROSCOPIC ONLY
Bacteria, UA: NONE SEEN
Casts: NONE SEEN
Crystals: NONE SEEN

## 2013-03-19 LAB — URINALYSIS, ROUTINE W REFLEX MICROSCOPIC
Bilirubin Urine: NEGATIVE
Glucose, UA: NEGATIVE mg/dL
Ketones, ur: NEGATIVE mg/dL
Leukocytes, UA: NEGATIVE
Nitrite: NEGATIVE
Protein, ur: >300 mg/dL — AB
Specific Gravity, Urine: 1.013 (ref 1.005–1.030)
Urobilinogen, UA: 0.2 mg/dL (ref 0.0–1.0)
pH: 5.5 (ref 5.0–8.0)

## 2013-03-31 ENCOUNTER — Encounter (HOSPITAL_COMMUNITY): Payer: Self-pay | Admitting: Emergency Medicine

## 2013-03-31 ENCOUNTER — Emergency Department (HOSPITAL_COMMUNITY): Payer: Medicaid Other

## 2013-03-31 ENCOUNTER — Emergency Department (HOSPITAL_COMMUNITY)
Admission: EM | Admit: 2013-03-31 | Discharge: 2013-03-31 | Disposition: A | Discharge status: Home / Self Care | Payer: Medicaid Other | Attending: Emergency Medicine | Admitting: Emergency Medicine

## 2013-03-31 DIAGNOSIS — H548 Legal blindness, as defined in USA: Secondary | ICD-10-CM | POA: Insufficient documentation

## 2013-03-31 DIAGNOSIS — D759 Disease of blood and blood-forming organs, unspecified: Secondary | ICD-10-CM | POA: Insufficient documentation

## 2013-03-31 DIAGNOSIS — R5381 Other malaise: Secondary | ICD-10-CM | POA: Insufficient documentation

## 2013-03-31 DIAGNOSIS — F17200 Nicotine dependence, unspecified, uncomplicated: Secondary | ICD-10-CM | POA: Insufficient documentation

## 2013-03-31 DIAGNOSIS — R110 Nausea: Secondary | ICD-10-CM | POA: Insufficient documentation

## 2013-03-31 DIAGNOSIS — I509 Heart failure, unspecified: Secondary | ICD-10-CM | POA: Insufficient documentation

## 2013-03-31 DIAGNOSIS — Z79899 Other long term (current) drug therapy: Secondary | ICD-10-CM | POA: Insufficient documentation

## 2013-03-31 DIAGNOSIS — Z7982 Long term (current) use of aspirin: Secondary | ICD-10-CM | POA: Insufficient documentation

## 2013-03-31 DIAGNOSIS — R55 Syncope and collapse: Secondary | ICD-10-CM | POA: Insufficient documentation

## 2013-03-31 DIAGNOSIS — Z5189 Encounter for other specified aftercare: Secondary | ICD-10-CM | POA: Insufficient documentation

## 2013-03-31 DIAGNOSIS — M6281 Muscle weakness (generalized): Secondary | ICD-10-CM | POA: Insufficient documentation

## 2013-03-31 DIAGNOSIS — D571 Sickle-cell disease without crisis: Secondary | ICD-10-CM

## 2013-03-31 LAB — CBC
HCT: 18.9 % — ABNORMAL LOW (ref 39.0–52.0)
Hemoglobin: 7.1 g/dL — ABNORMAL LOW (ref 13.0–17.0)
MCH: 30.3 pg (ref 26.0–34.0)
MCHC: 36.8 g/dL — ABNORMAL HIGH (ref 30.0–36.0)
MCV: 83.6 fL (ref 78.0–100.0)
Platelets: 206 10*3/uL (ref 150–400)
RBC: 2.26 MIL/uL — ABNORMAL LOW (ref 4.22–5.81)
RDW: 21.4 % — ABNORMAL HIGH (ref 11.5–15.5)
WBC: 14.6 10*3/uL — ABNORMAL HIGH (ref 4.0–10.5)

## 2013-03-31 LAB — RETICULOCYTES
RBC.: 2.27 MIL/uL — ABNORMAL LOW (ref 4.22–5.81)
Retic Count, Absolute: 397.3 10*3/uL — ABNORMAL HIGH (ref 19.0–186.0)
Retic Ct Pct: 17.5 % — ABNORMAL HIGH (ref 0.4–3.1)

## 2013-03-31 LAB — BASIC METABOLIC PANEL
BUN: 49 mg/dL — ABNORMAL HIGH (ref 6–23)
CO2: 16 mEq/L — ABNORMAL LOW (ref 19–32)
Calcium: 8.5 mg/dL (ref 8.4–10.5)
Chloride: 104 mEq/L (ref 96–112)
Creatinine, Ser: 2.32 mg/dL — ABNORMAL HIGH (ref 0.50–1.35)
GFR calc Af Amer: 35 mL/min — ABNORMAL LOW (ref >90)
GFR calc non Af Amer: 30 mL/min — ABNORMAL LOW (ref >90)
Glucose, Bld: 116 mg/dL — ABNORMAL HIGH (ref 70–99)
Potassium: 4.8 mEq/L (ref 3.5–5.1)
Sodium: 132 mEq/L — ABNORMAL LOW (ref 135–145)

## 2013-03-31 LAB — POCT I-STAT TROPONIN I
Troponin i, poc: 0.01 ng/mL (ref 0.00–0.08)
Troponin i, poc: 0 ng/mL (ref 0.00–0.08)

## 2013-03-31 MED ORDER — SODIUM CHLORIDE 0.9 % IV BOLUS (SEPSIS)
1000.0000 mL | Freq: Once | INTRAVENOUS | Status: AC
  Administered 2013-03-31: 1000 mL via INTRAVENOUS

## 2013-03-31 MED ORDER — HYDROMORPHONE HCL PF 1 MG/ML IJ SOLN
1.0000 mg | Freq: Once | INTRAMUSCULAR | Status: AC
  Administered 2013-03-31: 1 mg via INTRAVENOUS
  Filled 2013-03-31: qty 1

## 2013-03-31 NOTE — ED Provider Notes (Signed)
CSN: [id number]     Arrival date & time 03/31/13  1325 History   First MD Initiated Contact with Patient 03/31/13 1341     Chief Complaint  Patient presents with  . Chest Pain  . Weakness   (Consider location/radiation/quality/duration/timing/severity/associated sxs/prior Treatment) Patient is a 57 y.o. male presenting with general illness.  Illness Location:  Generalized Quality:  Near syncope, weakness Severity:  Moderate Onset quality:  Sudden Duration:  1 hour Timing:  Constant Progression:  Resolved Chronicity:  Recurrent Context:  Ho sickle cell Relieved by:  Nothing Worsened by:  Nothing Associated symptoms: nausea   Associated symptoms: no abdominal pain, no chest pain, no congestion, no cough, no diarrhea, no fever, no headaches, no loss of consciousness, no rash, no rhinorrhea, no shortness of breath, no sore throat, no vomiting and no wheezing     Past Medical History  Diagnosis Date  . Swelling of extremity, right   . Swelling abdomen   . Swelling of extremity, left   . CHF (congestive heart failure)   . Blood transfusion     "today is the second time I've had a blood transfusion" (11/21/2012)  . Sickle cell anemia   . Blood dyscrasia   . Exertional shortness of breath   . Legally blind    Past Surgical History  Procedure Laterality Date  . No past surgeries     Family History  Problem Relation Age of Onset  . Alcohol abuse Mother   . Liver disease Mother   . Cirrhosis Mother    History  Substance Use Topics  . Smoking status: Current Every Day Smoker -- 0.25 packs/day for 40 years    Types: Cigarettes  . Smokeless tobacco: Never Used  . Alcohol Use: 0.0 oz/week     Comment: 11/21/2012 "used to drink a beer q now and then; last beer ~ 3 months ago"    Review of Systems  Constitutional: Negative for fever and chills.  HENT: Negative for congestion, rhinorrhea and sore throat.   Eyes: Negative for photophobia and visual disturbance.  Respiratory:  Negative for cough, shortness of breath and wheezing.   Cardiovascular: Negative for chest pain and leg swelling.  Gastrointestinal: Positive for nausea. Negative for vomiting, abdominal pain, diarrhea and constipation.  Endocrine: Negative for polydipsia and polyuria.  Genitourinary: Negative for dysuria and hematuria.  Musculoskeletal: Negative for arthralgias and back pain.  Skin: Negative for color change and rash.  Neurological: Negative for dizziness, loss of consciousness, syncope, light-headedness and headaches.  Hematological: Negative for adenopathy. Does not bruise/bleed easily.  All other systems reviewed and are negative.    Allergies  Review of patient's allergies indicates no known allergies.  Home Medications   Current Outpatient Rx  Name  Route  Sig  Dispense  Refill  . allopurinol (ZYLOPRIM) 100 MG tablet   Oral   Take 100 mg by mouth daily.         Marland Kitchen aspirin EC 81 MG tablet   Oral   Take 81 mg by mouth daily.         . carvedilol (COREG) 25 MG tablet   Oral   Take 25 mg by mouth 3 (three) times daily.         . folic acid (FOLVITE) 1 MG tablet   Oral   Take 1 mg by mouth daily.         . furosemide (LASIX) 40 MG tablet   Oral   Take 60 mg by mouth daily.         Marland Kitchen  hydrALAZINE (APRESOLINE) 25 MG tablet   Oral   Take 25 mg by mouth daily.         . isosorbide mononitrate (IMDUR) 30 MG 24 hr tablet   Oral   Take 30 mg by mouth daily.          BP 145/77  Pulse 73  Temp(Src) 97.4 F (36.3 C) (Oral)  Resp 18  Ht 5\' 10"  (1.778 m)  Wt 150 lb (68.04 kg)  BMI 21.52 kg/m2  SpO2 95% Physical Exam  Vitals reviewed. Constitutional: He is oriented to person, place, and time. He appears well-developed and well-nourished.  HENT:  Head: Normocephalic and atraumatic.  Eyes: Conjunctivae and EOM are normal.  Neck: Normal range of motion. Neck supple.  Cardiovascular: Normal rate, regular rhythm and normal heart sounds.   Pulmonary/Chest:  Effort normal and breath sounds normal. No respiratory distress.  Abdominal: He exhibits no distension. There is no tenderness. There is no rebound and no guarding.  Musculoskeletal: Normal range of motion.  Neurological: He is alert and oriented to person, place, and time.  Skin: Skin is warm and dry.    ED Course  Procedures (including critical care time) Labs Review Labs Reviewed  CBC - Abnormal; Notable for the following:    WBC 14.6 (*)    RBC 2.26 (*)    Hemoglobin 7.1 (*)    HCT 18.9 (*)    MCHC 36.8 (*)    RDW 21.4 (*)    All other components within normal limits  BASIC METABOLIC PANEL - Abnormal; Notable for the following:    Sodium 132 (*)    CO2 16 (*)    Glucose, Bld 116 (*)    BUN 49 (*)    Creatinine, Ser 2.32 (*)    GFR calc non Af Amer 30 (*)    GFR calc Af Amer 35 (*)    All other components within normal limits  RETICULOCYTES - Abnormal; Notable for the following:    Retic Ct Pct 17.5 (*)    RBC. 2.27 (*)    Retic Count, Manual 397.3 (*)    All other components within normal limits  POCT I-STAT TROPONIN I  POCT I-STAT TROPONIN I   Imaging Review Dg Chest 2 View  03/31/2013   CLINICAL DATA:  Weakness. Chest pain. Tobacco use.  EXAM: CHEST  2 VIEW  COMPARISON:  11/20/2012  FINDINGS: Mild atherosclerotic calcification of the aortic arch noted. Heart size within normal limits. No pleural effusion identified. Diffuse mild osteosclerosis.  IMPRESSION: Diffuse mild osteosclerosis may relate to the patient's underlying sickle cell disease. No acute thoracic findings observed.   Electronically Signed   By: Sherryl Barters M.D.   On: 03/31/2013 14:09    EKG Interpretation   None       Date: 03/31/2013  Rate: 65  Rhythm: normal sinus rhythm  QRS Axis: normal  Intervals: normal  ST/T Wave abnormalities: normal  Conduction Disutrbances: none  Narrative Interpretation: NSR  Old EKG Reviewed: No significant changes noted     MDM   1. Sickle cell anemia    2. Near syncope    57 y.o. male  with pertinent PMH of sickle cell anemia, chf presents with generalized malaise and near syncope. Patient was in normal state health until shortly prior to arrival when he developed near syncope while seated.  He had similar symptoms with anemia in august.  No known blood loss recently.  Physical exam as above benign.  Labs without  significant abnormality compared to pt baseline.  Initial troponin negative.  3 hour delta unremarkable.  As pt has baseline ho sickle cell and has been treated as outpt for near syncopal episodes and is followed by cardiology for same, feel him stable to fu with cardiology.  Doubt arrhythmia, however feel that holter monitoring might be warranted, but will defer to cardiology.  Doubt PE, PTX, ACS, or other emergent pathology.  DC home in stable condition.    Labs and imaging as above reviewed by myself and attending,Dr. Vanita Panda, with whom case was discussed.   1. Sickle cell anemia   2. Near syncope         Rexene Agent, MD 04/02/13 [phone]

## 2013-03-31 NOTE — ED Notes (Signed)
The pt wants to eat.  Still waiting

## 2013-03-31 NOTE — ED Notes (Signed)
Pt reports was riding in the car today with his family when got sweaty and "felt weak". Has hx of sickle cell crisis, reports chest hurt a little bit. Reports that he is just weak right now. Denies SOB. Pt is a x 4.

## 2013-03-31 NOTE — ED Notes (Signed)
Liter bolus infused nss

## 2013-04-03 NOTE — ED Provider Notes (Addendum)
This patient was seen in conjunction with the resident physician, Dr. Colin Rhein.  The documentation is accurate and reflects the impression and treatment plan, with the following additions.  On ECG the rate was 65, with no notable changes from prior.  On my exam the patient was in no distress.  The patient has multiple medical problems, absence of distress, the reassuring findings suggest the low probability of atypical ACS or other significant new acute pathology.   Carmin Muskrat, MD 04/03/13 [id number]  Carmin Muskrat, MD 04/10/13 [phone]

## 2013-04-23 ENCOUNTER — Ambulatory Visit (HOSPITAL_COMMUNITY)
Admission: RE | Admit: 2013-04-23 | Discharge: 2013-04-23 | Disposition: A | Discharge status: Home / Self Care | Payer: Self-pay | Source: Ambulatory Visit | Attending: Internal Medicine | Admitting: Internal Medicine

## 2013-04-23 ENCOUNTER — Encounter: Payer: Self-pay | Admitting: Internal Medicine

## 2013-04-23 VITALS — BP 142/74 | HR 92 | Wt 155.0 lb

## 2013-04-23 DIAGNOSIS — M109 Gout, unspecified: Secondary | ICD-10-CM

## 2013-04-23 DIAGNOSIS — I10 Essential (primary) hypertension: Secondary | ICD-10-CM | POA: Insufficient documentation

## 2013-04-23 DIAGNOSIS — I5022 Chronic systolic (congestive) heart failure: Secondary | ICD-10-CM | POA: Insufficient documentation

## 2013-04-23 MED ORDER — CARVEDILOL 25 MG PO TABS: 25.0000 mg | ORAL_TABLET | Freq: Two times a day (BID) | ORAL | Status: DC

## 2013-04-23 MED ORDER — HYDRALAZINE HCL 25 MG PO TABS: 25.0000 mg | ORAL_TABLET | Freq: Three times a day (TID) | ORAL | Status: DC

## 2013-04-23 NOTE — Progress Notes (Signed)
Patient ID: Patrick [name], male   DOB: [birth year], 57 y.o.   MRN: [id number]  Weight Range    145-148 pounds  Baseline proBNP   993.1 on 01/19/12  PCP: [name] : None Opthalmologist: [name]  [facility]   HPI: Patrick [name] is a 57 y.o. African American male with a history of  sickle cell anemia, systolic HF due to NICM EF 30-35% diagnosed 05/2011, no CAD by cath 05/2011, CKD stage III, legally blind, and tobacco abuse.   01/17/12 ECHO 55%-60%  11/20/12 ECHO EF  40% Peak PA pressure 56  Readmitted 11/18/12 from HF clinic with voume overload. Hemoglobin 6.7 Transfused 2 UPRBC. Diuresed with IV lasix. Lisinopril stopped due to CKD. Discharge weight 143 pounds.    Admitted 02/07/13 through 02/12/13 after being treated for N/V/D, and abdominal pain. CT of abdomen- cholelithias with plans to f/u for elective cholecystectomy. Received 2UPRBC.  Hemoglobin 6.1>7.1.  Diuretics held due to dehydration. He restarted lasix 60 mg bid 02/13/13.   Labs        02/17/13: K+ 5.0, BUN 45, Creatinine 2.2, proBNP 10135  Follow up: Doing pretty good. He is in the process of moving. Last visit increased hydralazine to 25 mg TID, however on patient's record says once a day and coreg TID. Denies SOB, orthopnea of CP. Yesterday DOE on incline. Weight at home 154-156 lbs. Has not needed any extra lasix. Trying to follow a low salt diet and he is drinking less than 2L a day.   He is not on Sprironolactone due to hyperkalemia. He is not on ace inhibitor due to renal failure.     SH: Lives with Patrick [name] and cousin in [place].  ROS: All systems negative except as listed in HPI, PMH and Problem List.  Past Medical History  Diagnosis Date  . Swelling of extremity, right   . Swelling abdomen   . Swelling of extremity, left   . CHF (congestive heart failure)   . Blood transfusion     "today is the second time I've had a blood transfusion" (11/21/2012)  . Sickle cell anemia   . Blood dyscrasia   .  Exertional shortness of breath   . Legally blind     Current Outpatient Prescriptions  Medication Sig Dispense Refill  . allopurinol (ZYLOPRIM) 100 MG tablet Take 100 mg by mouth daily.      Marland Kitchen aspirin EC 81 MG tablet Take 81 mg by mouth daily.      . carvedilol (COREG) 25 MG tablet Take 25 mg by mouth 3 (three) times daily.      . folic acid (FOLVITE) 1 MG tablet Take 1 mg by mouth daily.      . furosemide (LASIX) 40 MG tablet Take 60 mg by mouth daily.      . hydrALAZINE (APRESOLINE) 25 MG tablet Take 25 mg by mouth daily.      . isosorbide mononitrate (IMDUR) 30 MG 24 hr tablet Take 30 mg by mouth daily.       No current facility-administered medications for this encounter.    Filed Vitals:   04/23/13 1352  BP: 142/74  Pulse: 92  Weight: 155 lb (70.308 kg)  SpO2: 93%    PHYSICAL EXAM: General:  Well appearing.  No resp difficulty with his girlfriend HEENT: normal except for mild scleral icterus Neck: supple. JVP to jaw +prominent CV waves. Carotids 2+ bilaterally; no bruits. No lymphadenopathy or thryomegaly appreciated. Cor: PMI normal Regular rate &  rhythm. No rubs, gallops 2/6 TR and 2/6 MR Lungs: clear    Abdomen: soft, nontender, mildly distented. No hepatosplenomegaly. No bruits or masses. Good bowel sounds. Extremities: no cyanosis, rash. TR edema ,  + clubbing. Neuro: alert & orientedx3, cranial nerves grossly intact. Moves all 4 extremities w/o difficulty. Affect pleasant.   ASSESSMENT & PLAN: 1. Chronic Systolic Heart Failure -NICM  Cath 05/2011 ECHO 11/20/12 EF 40% (previous 55% 01/2012). - NYHA II symptoms. Volume status mildly elevated. Will increase lasix to 40 mg BID for 2 days and then go back to 60 mg daily - Patient was taking coreg 25 mg TID, have asked him to go back to 25 mg BID. Believe it was changed when he visited the ED.  - Taking Hydralazine 25 mg daily and have asked to go back to 25 mg TID (believe changed in ED visit). Continue IMDUR 30 mg  daily. - He is not on Ace/Arb/spiro due to hyperkalemia/ CKD - Reinforced daily weights, low salt food choices, and limiting fluid intake to , 2 liters perday - Check BMET/Pro BNP today with [facility] 2. Gout - Continue allopurinol 100 mg daily. No s/s of gout flare.   3. HTN - as above change hydralazine to TID and coreg to BID. If BP remains elevated next visit can increase hydralazine.   F/U 3 weeks.  Junie Bame B NP-C 1:53 PM

## 2013-04-23 NOTE — Patient Instructions (Addendum)
Increase lasix to 80 mg (2 tablets) for 2 days and then go back to 60 mg (1 1/2 tablets) daily.  Change coreg to 25 mg twice a day.  Change hydralazine to 25 mg three times a day.  Follow up in 2-3 weeks.  Do the following things EVERYDAY: 1) Weigh yourself in the morning before breakfast. Write it down and keep it in a log. 2) Take your medicines as prescribed 3) Eat low salt foods-Limit salt (sodium) to 2000 mg per day.  4) Stay as active as you can everyday 5) Limit all fluids for the day to less than 2 liters 6)

## 2013-05-12 ENCOUNTER — Encounter (HOSPITAL_COMMUNITY): Payer: Self-pay

## 2013-05-21 ENCOUNTER — Encounter: Payer: Self-pay | Admitting: Internal Medicine

## 2013-05-25 ENCOUNTER — Encounter (HOSPITAL_COMMUNITY): Payer: Self-pay

## 2013-05-25 ENCOUNTER — Encounter: Payer: Self-pay | Admitting: Internal Medicine

## 2013-06-09 ENCOUNTER — Encounter (HOSPITAL_COMMUNITY): Payer: Self-pay

## 2013-06-09 ENCOUNTER — Encounter: Payer: Self-pay | Admitting: Internal Medicine

## 2013-06-09 ENCOUNTER — Ambulatory Visit (HOSPITAL_COMMUNITY)
Admission: RE | Admit: 2013-06-09 | Discharge: 2013-06-09 | Disposition: A | Discharge status: Home / Self Care | Payer: Self-pay | Source: Ambulatory Visit | Attending: Internal Medicine | Admitting: Internal Medicine

## 2013-06-09 VITALS — BP 148/92 | HR 73 | Ht 70.0 in | Wt 151.1 lb

## 2013-06-09 DIAGNOSIS — I10 Essential (primary) hypertension: Secondary | ICD-10-CM

## 2013-06-09 DIAGNOSIS — N183 Chronic kidney disease, stage 3 unspecified: Secondary | ICD-10-CM

## 2013-06-09 DIAGNOSIS — N189 Chronic kidney disease, unspecified: Secondary | ICD-10-CM | POA: Insufficient documentation

## 2013-06-09 DIAGNOSIS — I5022 Chronic systolic (congestive) heart failure: Secondary | ICD-10-CM | POA: Insufficient documentation

## 2013-06-09 DIAGNOSIS — I129 Hypertensive chronic kidney disease with stage 1 through stage 4 chronic kidney disease, or unspecified chronic kidney disease: Secondary | ICD-10-CM | POA: Insufficient documentation

## 2013-06-09 MED ORDER — FUROSEMIDE 40 MG PO TABS: 40.0000 mg | ORAL_TABLET | Freq: Every day | ORAL | Status: DC

## 2013-06-09 MED ORDER — HYDRALAZINE HCL 25 MG PO TABS: 25.0000 mg | ORAL_TABLET | Freq: Two times a day (BID) | ORAL | Status: DC

## 2013-06-09 NOTE — Patient Instructions (Signed)
Follow up in 2-3 weeks   Take hydralazine 25 mg twice a day  Take lasix 40 mg daily  Do the following things EVERYDAY: 1) Weigh yourself in the morning before breakfast. Write it down and keep it in a log. 2) Take your medicines as prescribed 3) Eat low salt foods-Limit salt (sodium) to 2000 mg per day.  4) Stay as active as you can everyday 5) Limit all fluids for the day to less than 2 liters

## 2013-06-09 NOTE — Progress Notes (Signed)
Patient ID: JEREMEY DIGUGLIELMO, male   DOB: 1956/03/16, 57 y.o.   MRN: [id number]   Weight Range    145-148 pounds  Baseline proBNP   993.1 on 01/19/12  PCP: [name] : None Opthalmologist: [name]  [facility]   HPI: Mr. Hext is a 57 y.o. African American male with a history of  sickle cell anemia, systolic HF due to NICM EF 30-35% diagnosed 05/2011, no CAD by cath 05/2011, CKD stage III, legally blind, and tobacco abuse.   01/17/12 ECHO 55%-60%  11/20/12 ECHO EF  40% Peak PA pressure 56  Readmitted 11/18/12 from HF clinic with voume overload. Hemoglobin 6.7 Transfused 2 UPRBC. Diuresed with IV lasix. Lisinopril stopped due to CKD. Discharge weight 143 pounds.    Admitted 02/07/13 through 02/12/13 after being treated for N/V/D, and abdominal pain. CT of abdomen- cholelithias with plans to f/u for elective cholecystectomy. Received 2UPRBC.  Hemoglobin 6.1>7.1.  Diuretics held due to dehydration. He restarted lasix 60 mg bid 02/13/13.   Labs        02/17/13: K+ 5.0, BUN 45, Creatinine 2.2, proBNP 10135       05/21/13 K 5.4 Creatinine 2.25 Pro BNP 3076  He returns for follow up with his girlfriend. Last visit lasix was increased to 40 mg twice a day and then he was to take 60 mg daily however he continued on 40 mg daily.   Mild dyspnea with exertion. Denies PND/Orthopnea. He is not weighing at home because he can't  see the numbers on the scale. Says his vision is getting is worse. He is legally blind. Says he ran out of lasix for about 4 days last week. Eating high salt foods such as sausage and bacon.  Drinking less than 2L a day. He has only been taking hydralazine once a day.   He is not on Sprironolactone due to hyperkalemia. He is not on ace inhibitor due to renal failure.     SH: Lives with Elenor Legato and cousin in [place]. Smokes 5 -6 cigarettes per day.   ROS: All systems negative except as listed in HPI, PMH and Problem List.  Past Medical History  Diagnosis Date  .  Swelling of extremity, right   . Swelling abdomen   . Swelling of extremity, left   . CHF (congestive heart failure)   . Blood transfusion     "today is the second time I've had a blood transfusion" (11/21/2012)  . Sickle cell anemia   . Blood dyscrasia   . Exertional shortness of breath   . Legally blind     Current Outpatient Prescriptions  Medication Sig Dispense Refill  . aspirin EC 81 MG tablet Take 81 mg by mouth daily.      . carvedilol (COREG) 25 MG tablet Take 1 tablet (25 mg total) by mouth 2 (two) times daily with a meal.  60 tablet  6  . furosemide (LASIX) 40 MG tablet Take 80 mg by mouth daily.       . hydrALAZINE (APRESOLINE) 25 MG tablet Take 1 tablet (25 mg total) by mouth 3 (three) times daily.  90 tablet  6  . isosorbide mononitrate (IMDUR) 30 MG 24 hr tablet Take 30 mg by mouth daily.       No current facility-administered medications for this encounter.    Filed Vitals:   06/09/13 1325  BP: 148/92  Pulse: 73  Height: 5\' 10"  (1.778 m)  Weight: 151 lb 1.9 oz (68.548  kg)  SpO2: 96%    PHYSICAL EXAM: General:  Well appearing.  No resp difficulty with his girlfriend HEENT: normal except for mild scleral icterus Neck: supple. JVP 6-7   +prominent CV waves. Carotids 2+ bilaterally; no bruits. No lymphadenopathy or thryomegaly appreciated. Cor: PMI normal Regular rate & rhythm. No rubs, gallops 2/6 TR and 2/6 MR Lungs: clear    Abdomen: soft, nontender, mildly distented. No hepatosplenomegaly. No bruits or masses. Good bowel sounds. Extremities: no cyanosis, rash,  edema ,  + clubbing. Neuro: alert & orientedx3, cranial nerves grossly intact. Moves all 4 extremities w/o difficulty. Affect pleasant.   ASSESSMENT & PLAN: 1. Chronic Systolic Heart Failure -NICM  Cath 05/2011 ECHO 11/20/12 EF 40% (previous 55% 01/2012). He is legally blind and he is unable to weigh because he cant see the weight.  - NYHA II symptoms. Volume status stable. Continue lasix 40 mg daily.    - Continue carvedilol 25 mg twice a day. - Taking Hydralazine 25 mg daily. He is agreeable to take hydralazine 25 mg twice a day and continue  IMDUR 30 mg daily. - He is not on Ace/Arb/spiro due to hyperkalemia/ CKD - Reinforced daily weights, low salt food choices, and limiting fluid intake to , 2 liters perday - Check BMET/Pro BNP today with [facility] 2. HTN - as above change hydralazine to 25 mg bid. noncompliant with tid. Continue coreg to BID.  3 CKD- Refer to nephrology Check BMET today  Follow up in 2-3 weeks.   Ena Demary NP-C 1:38 PM

## 2013-06-16 ENCOUNTER — Telehealth (HOSPITAL_COMMUNITY): Payer: Self-pay | Admitting: Cardiology

## 2013-06-16 NOTE — Telephone Encounter (Signed)
Spoke with [facility] referral coordinator Suanne Marker regarding referral originally faxed in September 2014.  Office has received fax unable to reach pt to schedule appt. Multiple attempts made however Suanne Marker will try once more to reach pt if unsuccessful will not be able to schedule with pt unless he contacts office. Verified phone numbers with Suanne Marker

## 2013-06-23 ENCOUNTER — Ambulatory Visit (HOSPITAL_COMMUNITY)
Admission: RE | Admit: 2013-06-23 | Discharge: 2013-06-23 | Disposition: A | Discharge status: Home / Self Care | Payer: Self-pay | Source: Ambulatory Visit | Attending: Internal Medicine | Admitting: Internal Medicine

## 2013-06-23 ENCOUNTER — Encounter: Payer: Self-pay | Admitting: Internal Medicine

## 2013-06-23 ENCOUNTER — Encounter (HOSPITAL_COMMUNITY): Payer: Self-pay

## 2013-06-23 VITALS — BP 132/70 | HR 86 | Resp 16 | Wt 150.5 lb

## 2013-06-23 DIAGNOSIS — N183 Chronic kidney disease, stage 3 unspecified: Secondary | ICD-10-CM

## 2013-06-23 DIAGNOSIS — I5022 Chronic systolic (congestive) heart failure: Secondary | ICD-10-CM

## 2013-06-23 DIAGNOSIS — I10 Essential (primary) hypertension: Secondary | ICD-10-CM

## 2013-06-23 MED ORDER — FUROSEMIDE 40 MG PO TABS: 40.0000 mg | ORAL_TABLET | Freq: Every day | ORAL | Status: DC

## 2013-06-23 MED ORDER — HYDRALAZINE HCL 50 MG PO TABS: 50.0000 mg | ORAL_TABLET | Freq: Two times a day (BID) | ORAL | Status: DC

## 2013-06-23 MED ORDER — ISOSORBIDE MONONITRATE ER 30 MG PO TB24: 30.0000 mg | ORAL_TABLET | Freq: Every day | ORAL | Status: DC

## 2013-06-23 MED ORDER — CARVEDILOL 25 MG PO TABS: 25.0000 mg | ORAL_TABLET | Freq: Two times a day (BID) | ORAL | Status: DC

## 2013-06-23 NOTE — Progress Notes (Signed)
Patient ID: Patrick [name], male   DOB: 1956/02/20, 58 y.o.   MRN: [id number]    Weight Range    145-148 pounds  Baseline proBNP   993.1 on 01/19/12  PCP: [name] : None Opthalmologist: [name]  Guide IT Trial   HPI: Patrick [name] is a 58 y.o. African American male with a history of  sickle cell anemia, systolic HF due to NICM EF 40% 11/2012 initially diagnosed 05/2011, no CAD by cath 05/2011, CKD stage III, legally blind, and tobacco abuse.   Readmitted 11/18/12 from HF clinic with voume overload. Hemoglobin 6.7 Transfused 2 UPRBC. Diuresed with IV lasix. Lisinopril stopped due to CKD. Discharge weight 143 pounds.    Admitted 02/07/13 through 02/12/13 after being treated for N/V/D, and abdominal pain. CT of abdomen- cholelithias with plans to f/u for elective cholecystectomy. Received 2UPRBC.  Hemoglobin 6.1>7.1.  Diuretics held due to dehydration. He restarted lasix 60 mg bid 02/13/13.   He returns for follow up with his girlfriend. Last visit hydralazine was increased 25 mg twice a day. As noted at previous visits he will not take hydralazine three times a day. Denies SOB/ PND/Orthopnea. He is not weighing at home because he can't  see the numbers on the scale. He is legally blind. Has all medications and he is taking all medications. Not exercising. Tries to follow low salt diet and limits fluid intake to < 2 liters per day.   01/17/12 ECHO 55%-60%  11/20/12 ECHO EF  40% Peak PA pressure 56  Labs        02/17/13: K+ 5.0, BUN 45, Creatinine 2.2, proBNP 10135       05/21/13 K 5.4 Creatinine 2.25 Pro BNP 3076        06/09/13 K 4.6 Creatinine 2.1 Pro BNP 10,727   He is not on Sprironolactone due to hyperkalemia. He is not on ace inhibitor due to renal failure.     SH: Lives with Patrick [name] and cousin in [place]. Smokes 5 -6 cigarettes per day.   ROS: All systems negative except as listed in HPI, PMH and Problem List.  Past Medical History  Diagnosis Date  . Swelling of extremity,  right   . Swelling abdomen   . Swelling of extremity, left   . CHF (congestive heart failure)   . Blood transfusion     "today is the second time I've had a blood transfusion" (11/21/2012)  . Sickle cell anemia   . Blood dyscrasia   . Exertional shortness of breath   . Legally blind     Current Outpatient Prescriptions  Medication Sig Dispense Refill  . aspirin EC 81 MG tablet Take 81 mg by mouth daily.      . carvedilol (COREG) 25 MG tablet Take 1 tablet (25 mg total) by mouth 2 (two) times daily with a meal.  60 tablet  6  . furosemide (LASIX) 40 MG tablet Take 1 tablet (40 mg total) by mouth daily. 40 mg daily and as needed  50 tablet  6  . hydrALAZINE (APRESOLINE) 25 MG tablet Take 1 tablet (25 mg total) by mouth 2 (two) times daily.  60 tablet  6  . isosorbide mononitrate (IMDUR) 30 MG 24 hr tablet Take 30 mg by mouth daily.       No current facility-administered medications for this encounter.    Filed Vitals:   06/23/13 1406  BP: 132/70  Pulse: 86  Resp: 16  Weight: 150 lb 8 oz (68.266  kg)  SpO2: 92%    PHYSICAL EXAM: General:  Well appearing.  No resp difficulty with his girlfriend HEENT: normal except for mild scleral icterus Neck: supple. JVP 5-6  +prominent CV waves. Carotids 2+ bilaterally; no bruits. No lymphadenopathy or thryomegaly appreciated. Cor: PMI normal Regular rate & rhythm. No rubs, gallops 2/6 TR and 2/6 MR Lungs: clear    Abdomen: soft, nontender, nondistended. No hepatosplenomegaly. No bruits or masses. Good bowel sounds. Extremities: no cyanosis, rash,  edema ,  + clubbing. Neuro: alert & orientedx3, cranial nerves grossly intact. Moves all 4 extremities w/o difficulty. Affect pleasant.   ASSESSMENT & PLAN: 1. Chronic Systolic Heart Failure -NICM  Cath 05/2011 ECHO 11/20/12 EF 40% (previous 55% 01/2012). He is legally blind and he is unable to weigh because he cant see the numbers on the scale. - NYHA II symptoms. Volume status stable. Continue lasix  40 mg daily.   - Continue carvedilol 25 mg twice a day. . Increase hydralazine to 50 mg twice a day and  continue  Imdur 30 mg daily. - He is not on Ace/Arb/spiro due to hyperkalemia/ CKD - Reinforced daily weights, low salt food choices, and limiting fluid intake to , 2 liters perday - Check BMET/Pro BNP today with Guide-IT -Have asked HF SW to help with a "talking scale" for daily weights.  2. HTN- improving. As above increase  Hydralazine to 50 mg bid. Continue current dose of carvedilol, and Imdur.   3 CKD- Refer to nephrology Check BMET today  Follow up in 2-3 weeks.   Lidiya Reise NP-C 2:14 PM

## 2013-06-23 NOTE — Patient Instructions (Signed)
Follow up in 3 weeks  Take hydralazine 50 mg twice a day   Do the following things EVERYDAY: 1) Weigh yourself in the morning before breakfast. Write it down and keep it in a log. 2) Take your medicines as prescribed 3) Eat low salt foods-Limit salt (sodium) to 2000 mg per day.  4) Stay as active as you can everyday 5) Limit all fluids for the day to less than 2 liters

## 2013-06-24 ENCOUNTER — Telehealth: Payer: Self-pay | Admitting: Licensed Clinical Social Worker

## 2013-06-24 NOTE — Telephone Encounter (Signed)
CSW contacted [facility] [place] at [phone] to inquire about services and possible talking scale. CSW informed to return call with patient for further assessment. CSW will continue to attempt contact with patient for referral for services to [facility]. Raquel Sarna, [place]

## 2013-06-24 NOTE — Telephone Encounter (Signed)
CSW rec'd referral to assist patient with talking scale. CSW contacted home number listed in chart and spoke with patient's Aunt who states he no longer lives there and she provided CSW with new contact numbers. CSW attempted to contact with new numbers with no success due to number out of service and wrong number. CSW asked Aunt to have patient contact CSW when available. CSW informed clinic staff to alert on next scheduled clinic visit 1/20. CSW will await return call from patient and see on next clinic visit if unable to contact prior. Raquel Sarna, [place]

## 2013-07-06 ENCOUNTER — Telehealth (HOSPITAL_COMMUNITY): Payer: Self-pay | Admitting: Cardiology

## 2013-07-06 ENCOUNTER — Telehealth: Payer: Self-pay | Admitting: Internal Medicine

## 2013-07-06 NOTE — Telephone Encounter (Signed)
Left message for patient to call to schedule annual physical.

## 2013-07-06 NOTE — Telephone Encounter (Signed)
Pam, with [facility] called to inform CHF team Unable to reach pt after multiple attempts. Will discard referral and if providers are still interested in referring pt, will need to send another referral

## 2013-07-07 ENCOUNTER — Ambulatory Visit (HOSPITAL_COMMUNITY)
Admission: RE | Admit: 2013-07-07 | Discharge: 2013-07-07 | Disposition: A | Discharge status: Home / Self Care | Payer: Self-pay | Source: Ambulatory Visit | Attending: Internal Medicine | Admitting: Internal Medicine

## 2013-07-07 ENCOUNTER — Encounter: Payer: Self-pay | Admitting: Internal Medicine

## 2013-07-07 ENCOUNTER — Encounter: Payer: Self-pay | Admitting: Licensed Clinical Social Worker

## 2013-07-07 VITALS — BP 150/88 | HR 90 | Wt 152.0 lb

## 2013-07-07 DIAGNOSIS — I10 Essential (primary) hypertension: Secondary | ICD-10-CM | POA: Insufficient documentation

## 2013-07-07 DIAGNOSIS — D571 Sickle-cell disease without crisis: Secondary | ICD-10-CM

## 2013-07-07 DIAGNOSIS — I5022 Chronic systolic (congestive) heart failure: Secondary | ICD-10-CM | POA: Insufficient documentation

## 2013-07-07 MED ORDER — HYDRALAZINE HCL 50 MG PO TABS: 50.0000 mg | ORAL_TABLET | Freq: Three times a day (TID) | ORAL | Status: DC

## 2013-07-07 NOTE — Patient Instructions (Signed)
Follow up in 2-3 weeks  Take 40 mg of lasix twice a day for the next 2 days. On Thursday go back to 40 mg of lasix daily.   Take hydralazine 50 mg three times a day  Do the following things EVERYDAY: 1) Weigh yourself in the morning before breakfast. Write it down and keep it in a log. 2) Take your medicines as prescribed 3) Eat low salt foods-Limit salt (sodium) to 2000 mg per day.  4) Stay as active as you can everyday 5) Limit all fluids for the day to less than 2 liters

## 2013-07-07 NOTE — Progress Notes (Signed)
Patient ID: Patrick [name], male   DOB: [birth year], 58 y.o.   MRN: [id number]    Weight Range    145-148 pounds  Baseline proBNP   993.1 on 01/19/12  PCP: [name] : Dr Zigmund Daniel  Opthalmologist: [name]  Guide [facility] Trial   HPI: Patrick [name] is a 58 y.o. African American male with a history of  sickle cell anemia, systolic HF due to NICM EF 40% 11/2012 initially diagnosed 05/2011, no CAD by cath 05/2011, CKD stage III, legally blind, and tobacco abuse.   Readmitted 11/18/12 from HF clinic with voume overload. Hemoglobin 6.7 Transfused 2 UPRBC. Diuresed with IV lasix. Lisinopril stopped due to CKD. Discharge weight 143 pounds.    Admitted 02/07/13 through 02/12/13 after being treated for N/V/D, and abdominal pain. CT of abdomen- cholelithias with plans to f/u for elective cholecystectomy. Received 2UPRBC.  Hemoglobin 6.1>7.1.  Diuretics held due to dehydration. He restarted lasix 60 mg bid 02/13/13.   He returns for follow up with his girlfriend. Last visit hydralazine was increased 50 mg  twice a day. Complains of fatigue and mild dyspnea with exertion.  Complains of constipation. Says his sight is getting worse. He is not weighing at home because he can't  see the numbers on the scale. He is legally blind. Taking all medications. Tries to follow low salt diet and limits fluid intake to < 2 liters per day.   01/17/12 ECHO 55%-60%  11/20/12 ECHO EF  40% Peak PA pressure 56  Labs        02/17/13: K+ 5.0, BUN 45, Creatinine 2.2, proBNP 10135       05/21/13 K 5.4 Creatinine 2.25 Pro BNP 3076        06/09/13 K 4.6 Creatinine 2.1 Pro BNP 10,727  He is not on Sprironolactone due to hyperkalemia. He is not on ace inhibitor due to renal failure.     SH: Lives with Patrick [name] and cousin in [place]. Smokes 5 -6 cigarettes per day.   ROS: All systems negative except as listed in HPI, PMH and Problem List.  Past Medical History  Diagnosis Date  . Swelling of extremity, right   . Swelling abdomen    . Swelling of extremity, left   . CHF (congestive heart failure)   . Blood transfusion     "today is the second time I've had a blood transfusion" (11/21/2012)  . Sickle cell anemia   . Blood dyscrasia   . Exertional shortness of breath   . Legally blind     Current Outpatient Prescriptions  Medication Sig Dispense Refill  . aspirin EC 81 MG tablet Take 81 mg by mouth daily.      . carvedilol (COREG) 25 MG tablet Take 1 tablet (25 mg total) by mouth 2 (two) times daily with a meal.  60 tablet  6  . furosemide (LASIX) 40 MG tablet Take 1 tablet (40 mg total) by mouth daily. 40 mg daily and as needed  50 tablet  6  . hydrALAZINE (APRESOLINE) 50 MG tablet Take 1 tablet (50 mg total) by mouth 2 (two) times daily.  60 tablet  6  . isosorbide mononitrate (IMDUR) 30 MG 24 hr tablet Take 1 tablet (30 mg total) by mouth daily.  30 tablet  6   No current facility-administered medications for this encounter.    Filed Vitals:   07/07/13 1230  BP: 150/88  Pulse: 90  Weight: 152 lb (68.947 kg)  SpO2: 92%  PHYSICAL EXAM: General:  Chronically ill appearing.  No resp difficulty with his girlfriend HEENT: normal except for mild scleral icterus Neck: supple. JVP to jaw.  +prominent CV waves. Carotids 2+ bilaterally; no bruits. No lymphadenopathy or thryomegaly appreciated. Cor: PMI normal Regular rate & rhythm. No rubs, gallops 2/6 TR and 2/6 MR Lungs: clear    Abdomen: soft, nontender, nondistended. No hepatosplenomegaly. No bruits or masses. Good bowel sounds. Extremities: no cyanosis, rash,  edema ,  + clubbing. Neuro: alert & orientedx3, cranial nerves grossly intact. Moves all 4 extremities w/o difficulty. Affect pleasant.   ASSESSMENT & PLAN: 1. Chronic Systolic Heart Failure -NICM  Cath 05/2011 ECHO 11/20/12 EF 40% (previous 55% 01/2012). He is legally blind and he is unable to weigh because he cant see the numbers on the scale. - NYHA II symptoms. Volume status elevated likely due to  increased fluid at home.  Increase lasix to 40 mg twice a day for the next 2 days then resume lasix 40 mg daily.   - On  goal dose carvedilol 25 mg twice a day. Marland Kitchen He is agreeable to add lunch time dose of hydralazine 50 mg  which will make it  hydralazine to 50 mg three times  A day.  Continue Imdur 30 mg daily. - He is not on Ace/Arb/spiro due to hyperkalemia/ CKD - Reinforced daily weights, low salt food choices, and limiting fluid intake to , 2 liters perday - Check BMET/Pro BNP today with Guide-IT    2. HTN- BP remains elevated.  As above increase  Hydralazine to 50 mg tid. Continue current dose of carvedilol, and Imdur.   3 Sickle Cell- follow up with Dr Zigmund Daniel.   Follow up in 2-3 weeks.   Haydee Jabbour NP-C 12:36 PM

## 2013-07-07 NOTE — Progress Notes (Signed)
CSW met with patient and Sandford Craze (girlfriend) in the clinic to assess for community resources. Patient reports that he resides alone and is on Social Security Disability. He currently has no insurance and has a pending Insurance underwriter. He pays out of pocket for his medications and uses [facility]. His girlfriend commented that he gets depressed at times due to limited eyesight and inability to get out of the house and recent decline in health related issues. 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 "I just sit home and watch TV now". CSW provided support and discussed referral to [facility] [place] For resources for talking scale (for heart failure), transportation, books on tape and possible someone to read his mail. Patient reports pending medicaid application for health insurance until eligibility for Medicare. CSW will continue to be available as needed. Raquel Sarna, [place]

## 2013-07-09 ENCOUNTER — Telehealth: Payer: Self-pay | Admitting: Licensed Clinical Social Worker

## 2013-07-09 NOTE — Telephone Encounter (Signed)
CSW rec'd return call from Ms. Hairston at [employer] [facility] [employer] [facility] [place] provided referral information for assistance with talking scale as well as additional resources to assist patient in the community. Ms. Moishe Spice will contact patient and make arrangements for home visit to evaluate for community services. CSW continues to be available as needed. Raquel Sarna, [place]

## 2013-07-21 ENCOUNTER — Ambulatory Visit (HOSPITAL_COMMUNITY)
Admission: RE | Admit: 2013-07-21 | Discharge: 2013-07-21 | Disposition: A | Discharge status: Home / Self Care | Payer: Self-pay | Source: Ambulatory Visit | Attending: Internal Medicine | Admitting: Internal Medicine

## 2013-07-21 VITALS — BP 138/62 | HR 90 | Wt 150.0 lb

## 2013-07-21 DIAGNOSIS — I10 Essential (primary) hypertension: Secondary | ICD-10-CM | POA: Insufficient documentation

## 2013-07-21 DIAGNOSIS — M109 Gout, unspecified: Secondary | ICD-10-CM

## 2013-07-21 DIAGNOSIS — I5022 Chronic systolic (congestive) heart failure: Secondary | ICD-10-CM | POA: Insufficient documentation

## 2013-07-21 DIAGNOSIS — Z7982 Long term (current) use of aspirin: Secondary | ICD-10-CM | POA: Insufficient documentation

## 2013-07-21 DIAGNOSIS — Z720 Tobacco use: Secondary | ICD-10-CM

## 2013-07-21 DIAGNOSIS — H548 Legal blindness, as defined in USA: Secondary | ICD-10-CM | POA: Insufficient documentation

## 2013-07-21 DIAGNOSIS — D571 Sickle-cell disease without crisis: Secondary | ICD-10-CM | POA: Insufficient documentation

## 2013-07-21 DIAGNOSIS — Z79899 Other long term (current) drug therapy: Secondary | ICD-10-CM | POA: Insufficient documentation

## 2013-07-21 DIAGNOSIS — I509 Heart failure, unspecified: Secondary | ICD-10-CM | POA: Insufficient documentation

## 2013-07-21 DIAGNOSIS — F17200 Nicotine dependence, unspecified, uncomplicated: Secondary | ICD-10-CM | POA: Insufficient documentation

## 2013-07-21 MED ORDER — HYDRALAZINE HCL 50 MG PO TABS: 75.0000 mg | ORAL_TABLET | Freq: Three times a day (TID) | ORAL | Status: DC

## 2013-07-21 NOTE — Progress Notes (Signed)
Patient ID: Patrick [name], male   DOB: August 23, 1955, 58 y.o.   MRN: [id number]    Weight Range    145-148 pounds  Baseline proBNP   993.1 on 01/19/12  PCP: [name] : Dr Zigmund Daniel  Opthalmologist: [name]  Guide [facility] Trial   HPI: Patrick [name] is a 58 y.o. African American male with a history of  sickle cell anemia, systolic HF due to NICM EF 40% 11/2012 initially diagnosed 05/2011, no CAD by cath 05/2011, CKD stage III, legally blind, and tobacco abuse.   Readmitted 11/18/12 from HF clinic with voume overload. Hemoglobin 6.7 Transfused 2 UPRBC. Diuresed with IV lasix. Lisinopril stopped due to CKD. Discharge weight 143 pounds.    Admitted 02/07/13 through 02/12/13 after being treated for N/V/D, and abdominal pain. CT of abdomen- cholelithias with plans to f/u for elective cholecystectomy. Received 2UPRBC.  Hemoglobin 6.1>7.1.  Diuretics held due to dehydration. He restarted lasix 60 mg bid 02/13/13.   He returns for follow up with his girlfriend. Last visit hydralazine was increased 50 mg  three times a day. Denies SOB/PND. + Orthopnea sleeps on 2 pillows. Taking all medications. He has follow up eye exam with [name].  Smoking 6 cigarettes a day. Working with [facility] for [employer] Wants to get a job.   01/17/12 ECHO 55%-60%  11/20/12 ECHO EF  40% Peak PA pressure 56  Labs        02/17/13: K+ 5.0, BUN 45, Creatinine 2.2, proBNP 10135       05/21/13 K 5.4 Creatinine 2.25 Pro BNP 3076        06/09/13 K 4.6 Creatinine 2.1 Pro BNP 10,727  He is not on Sprironolactone due to hyperkalemia. He is not on ace inhibitor due to renal failure.     SH: Lives with Patrick [name] and cousin in [place]. Smokes 5 -6 cigarettes per day.   ROS: All systems negative except as listed in HPI, PMH and Problem List.  Past Medical History  Diagnosis Date  . Swelling of extremity, right   . Swelling abdomen   . Swelling of extremity, left   . CHF (congestive heart failure)   . Blood  transfusion     "today is the second time I've had a blood transfusion" (11/21/2012)  . Sickle cell anemia   . Blood dyscrasia   . Exertional shortness of breath   . Legally blind     Current Outpatient Prescriptions  Medication Sig Dispense Refill  . aspirin EC 81 MG tablet Take 81 mg by mouth daily.      . carvedilol (COREG) 25 MG tablet Take 1 tablet (25 mg total) by mouth 2 (two) times daily with a meal.  60 tablet  6  . furosemide (LASIX) 40 MG tablet Take 1 tablet (40 mg total) by mouth daily. 40 mg daily and as needed  50 tablet  6  . hydrALAZINE (APRESOLINE) 50 MG tablet Take 1 tablet (50 mg total) by mouth 3 (three) times daily.  90 tablet  6  . isosorbide mononitrate (IMDUR) 30 MG 24 hr tablet Take 1 tablet (30 mg total) by mouth daily.  30 tablet  6   No current facility-administered medications for this encounter.    Filed Vitals:   07/21/13 1334  BP: 138/62  Pulse: 90  Weight: 150 lb (68.04 kg)  SpO2: 92%    PHYSICAL EXAM: General:  Chronically ill appearing.  No resp difficulty with his girlfriend HEENT:  normal except for mild scleral icterus Neck: supple. JVP 6-7.  +prominent CV waves. Carotids 2+ bilaterally; no bruits. No lymphadenopathy or thryomegaly appreciated. Cor: PMI normal Regular rate & rhythm. No rubs, gallops 2/6 TR and 2/6 MR Lungs: clear    Abdomen: soft, nontender, nondistended. No hepatosplenomegaly. No bruits or masses. Good bowel sounds. Extremities: no cyanosis, rash,  edema ,  + clubbing. Neuro: alert & orientedx3, cranial nerves grossly intact. Moves all 4 extremities w/o difficulty. Affect pleasant.   ASSESSMENT & PLAN: 1. Chronic Systolic Heart Failure -NICM  Cath 05/2011 ECHO 11/20/12 EF 40% (previous 55% 01/2012). He is legally blind and he is unable to weigh because he cant see the numbers on the scale. - NYHA II symptoms. Volume status stable. Continue lasix to 40 mg daily.    - On  goal dose carvedilol 25 mg twice a day.  - Increase  hydralazine to 75 mg tid. Continue Imdur 30 mg daily. - He is not on Ace/Arb/spiro due to hyperkalemia/ CKD - Reinforced daily weights, low salt food choices, and limiting fluid intake to , 2 liters perday - Check BMET/Pro BNP today with Guide-IT   2. HTN- BP improving.  As above increase  Hydralazine to 75 mg tid. Continue current dose of carvedilol, and Imdur.   3 Sickle Cell- follow up with Dr Zigmund Daniel.  4. Current smoker- declines smoking cessation Encouraged to stop smoking 5. Legally Blind- Followed by [name]. [facility] following. Hopefully can get "talking scale" for daily weights.   Follow up in 2-3 weeks Patrick Gannett NP-C 1:37 PM

## 2013-07-21 NOTE — Patient Instructions (Signed)
Follow up in 2-3 weeks  Take hydralazine 75 mg tid  Do the following things EVERYDAY: 1) Weigh yourself in the morning before breakfast. Write it down and keep it in a log. 2) Take your medicines as prescribed 3) Eat low salt foods-Limit salt (sodium) to 2000 mg per day.  4) Stay as active as you can everyday 5) Limit all fluids for the day to less than 2 liters

## 2013-08-03 ENCOUNTER — Other Ambulatory Visit: Payer: Self-pay | Admitting: Internal Medicine

## 2013-08-03 DIAGNOSIS — N183 Chronic kidney disease, stage 3 unspecified: Secondary | ICD-10-CM

## 2013-08-03 DIAGNOSIS — D571 Sickle-cell disease without crisis: Secondary | ICD-10-CM

## 2013-08-03 NOTE — Progress Notes (Signed)
Orders placed for labs to be done 1 week before next office visit.

## 2013-08-04 ENCOUNTER — Encounter (HOSPITAL_COMMUNITY): Payer: Self-pay

## 2013-08-10 ENCOUNTER — Telehealth: Payer: Self-pay | Admitting: Internal Medicine

## 2013-08-10 NOTE — Telephone Encounter (Signed)
Patient called to confirm appointment. Lab appointment 08/20/13 830am

## 2013-08-13 ENCOUNTER — Encounter (HOSPITAL_COMMUNITY): Payer: Self-pay

## 2013-08-19 ENCOUNTER — Other Ambulatory Visit: Payer: Self-pay

## 2013-08-19 ENCOUNTER — Telehealth (HOSPITAL_COMMUNITY): Payer: Self-pay | Admitting: Adult Health

## 2013-08-19 ENCOUNTER — Inpatient Hospital Stay (HOSPITAL_COMMUNITY)
Admission: EM | Admit: 2013-08-19 | Discharge: 2013-08-22 | DRG: 292 | Disposition: A | Discharge status: Home / Self Care | Payer: Medicaid Other | Attending: Internal Medicine | Admitting: Internal Medicine

## 2013-08-19 ENCOUNTER — Emergency Department (HOSPITAL_COMMUNITY): Payer: Medicaid Other

## 2013-08-19 ENCOUNTER — Ambulatory Visit (HOSPITAL_BASED_OUTPATIENT_CLINIC_OR_DEPARTMENT_OTHER)
Admission: RE | Admit: 2013-08-19 | Discharge: 2013-08-19 | Disposition: A | Discharge status: Home / Self Care | Payer: Medicaid Other | Source: Ambulatory Visit | Attending: Internal Medicine | Admitting: Internal Medicine

## 2013-08-19 VITALS — BP 112/52 | HR 75 | Wt 155.2 lb

## 2013-08-19 DIAGNOSIS — F17200 Nicotine dependence, unspecified, uncomplicated: Secondary | ICD-10-CM | POA: Diagnosis present

## 2013-08-19 DIAGNOSIS — D649 Anemia, unspecified: Secondary | ICD-10-CM

## 2013-08-19 DIAGNOSIS — I10 Essential (primary) hypertension: Secondary | ICD-10-CM

## 2013-08-19 DIAGNOSIS — K8020 Calculus of gallbladder without cholecystitis without obstruction: Secondary | ICD-10-CM

## 2013-08-19 DIAGNOSIS — R197 Diarrhea, unspecified: Secondary | ICD-10-CM

## 2013-08-19 DIAGNOSIS — I428 Other cardiomyopathies: Secondary | ICD-10-CM | POA: Diagnosis present

## 2013-08-19 DIAGNOSIS — I509 Heart failure, unspecified: Secondary | ICD-10-CM | POA: Diagnosis present

## 2013-08-19 DIAGNOSIS — I5022 Chronic systolic (congestive) heart failure: Secondary | ICD-10-CM

## 2013-08-19 DIAGNOSIS — D72829 Elevated white blood cell count, unspecified: Secondary | ICD-10-CM

## 2013-08-19 DIAGNOSIS — K859 Acute pancreatitis without necrosis or infection, unspecified: Secondary | ICD-10-CM

## 2013-08-19 DIAGNOSIS — N183 Chronic kidney disease, stage 3 unspecified: Secondary | ICD-10-CM

## 2013-08-19 DIAGNOSIS — R0602 Shortness of breath: Secondary | ICD-10-CM | POA: Diagnosis present

## 2013-08-19 DIAGNOSIS — I129 Hypertensive chronic kidney disease with stage 1 through stage 4 chronic kidney disease, or unspecified chronic kidney disease: Secondary | ICD-10-CM | POA: Diagnosis present

## 2013-08-19 DIAGNOSIS — I5043 Acute on chronic combined systolic (congestive) and diastolic (congestive) heart failure: Principal | ICD-10-CM | POA: Diagnosis present

## 2013-08-19 DIAGNOSIS — Z720 Tobacco use: Secondary | ICD-10-CM

## 2013-08-19 DIAGNOSIS — I472 Ventricular tachycardia: Secondary | ICD-10-CM

## 2013-08-19 DIAGNOSIS — H548 Legal blindness, as defined in USA: Secondary | ICD-10-CM | POA: Diagnosis present

## 2013-08-19 DIAGNOSIS — Z79899 Other long term (current) drug therapy: Secondary | ICD-10-CM | POA: Diagnosis not present

## 2013-08-19 DIAGNOSIS — N179 Acute kidney failure, unspecified: Secondary | ICD-10-CM | POA: Diagnosis present

## 2013-08-19 DIAGNOSIS — Z6379 Other stressful life events affecting family and household: Secondary | ICD-10-CM | POA: Diagnosis not present

## 2013-08-19 DIAGNOSIS — M109 Gout, unspecified: Secondary | ICD-10-CM | POA: Diagnosis present

## 2013-08-19 DIAGNOSIS — D571 Sickle-cell disease without crisis: Secondary | ICD-10-CM | POA: Diagnosis present

## 2013-08-19 DIAGNOSIS — R5383 Other fatigue: Secondary | ICD-10-CM

## 2013-08-19 DIAGNOSIS — R5381 Other malaise: Secondary | ICD-10-CM

## 2013-08-19 DIAGNOSIS — I5032 Chronic diastolic (congestive) heart failure: Secondary | ICD-10-CM

## 2013-08-19 DIAGNOSIS — I5042 Chronic combined systolic (congestive) and diastolic (congestive) heart failure: Secondary | ICD-10-CM | POA: Diagnosis present

## 2013-08-19 DIAGNOSIS — Z7982 Long term (current) use of aspirin: Secondary | ICD-10-CM | POA: Diagnosis not present

## 2013-08-19 DIAGNOSIS — I4729 Other ventricular tachycardia: Secondary | ICD-10-CM

## 2013-08-19 DIAGNOSIS — N390 Urinary tract infection, site not specified: Secondary | ICD-10-CM

## 2013-08-19 DIAGNOSIS — R112 Nausea with vomiting, unspecified: Secondary | ICD-10-CM

## 2013-08-19 LAB — CBC
HCT: 15.4 % — ABNORMAL LOW (ref 39.0–52.0)
Hemoglobin: 5.9 g/dL — CL (ref 13.0–17.0)
MCH: 34.3 pg — ABNORMAL HIGH (ref 26.0–34.0)
MCHC: 38.3 g/dL — ABNORMAL HIGH (ref 30.0–36.0)
MCV: 89.5 fL (ref 78.0–100.0)
Platelets: 163 10*3/uL (ref 150–400)
RBC: 1.72 MIL/uL — ABNORMAL LOW (ref 4.22–5.81)
RDW: 22.8 % — ABNORMAL HIGH (ref 11.5–15.5)
WBC: 9.2 10*3/uL (ref 4.0–10.5)

## 2013-08-19 LAB — COMPREHENSIVE METABOLIC PANEL
ALT: 18 U/L (ref 0–53)
AST: 43 U/L — ABNORMAL HIGH (ref 0–37)
Albumin: 3.7 g/dL (ref 3.5–5.2)
Alkaline Phosphatase: 119 U/L — ABNORMAL HIGH (ref 39–117)
BUN: 68 mg/dL — ABNORMAL HIGH (ref 6–23)
CO2: 18 mEq/L — ABNORMAL LOW (ref 19–32)
Calcium: 8.2 mg/dL — ABNORMAL LOW (ref 8.4–10.5)
Chloride: 103 mEq/L (ref 96–112)
Creatinine, Ser: 2.51 mg/dL — ABNORMAL HIGH (ref 0.50–1.35)
GFR calc Af Amer: 32 mL/min — ABNORMAL LOW (ref >90)
GFR calc non Af Amer: 27 mL/min — ABNORMAL LOW (ref >90)
Glucose, Bld: 109 mg/dL — ABNORMAL HIGH (ref 70–99)
Potassium: 4.5 mEq/L (ref 3.7–5.3)
Sodium: 138 mEq/L (ref 137–147)
Total Bilirubin: 2.9 mg/dL — ABNORMAL HIGH (ref 0.3–1.2)
Total Protein: 6.8 g/dL (ref 6.0–8.3)

## 2013-08-19 LAB — CBC WITH DIFFERENTIAL/PLATELET
Basophils Absolute: 0 10*3/uL (ref 0.0–0.1)
Basophils Relative: 0 % (ref 0–1)
Eosinophils Absolute: 0.6 10*3/uL (ref 0.0–0.7)
Eosinophils Relative: 6 % — ABNORMAL HIGH (ref 0–5)
HCT: 15.1 % — ABNORMAL LOW (ref 39.0–52.0)
Hemoglobin: 5.9 g/dL — CL (ref 13.0–17.0)
Lymphocytes Relative: 22 % (ref 12–46)
Lymphs Abs: 2.3 10*3/uL (ref 0.7–4.0)
MCH: 34.7 pg — ABNORMAL HIGH (ref 26.0–34.0)
MCHC: 38.4 g/dL — ABNORMAL HIGH (ref 30.0–36.0)
MCV: 88.8 fL (ref 78.0–100.0)
Monocytes Absolute: 1.6 10*3/uL — ABNORMAL HIGH (ref 0.1–1.0)
Monocytes Relative: 15 % — ABNORMAL HIGH (ref 3–12)
Neutro Abs: 6 10*3/uL (ref 1.7–7.7)
Neutrophils Relative %: 57 % (ref 43–77)
Platelets: 157 10*3/uL (ref 150–400)
RBC: 1.7 MIL/uL — ABNORMAL LOW (ref 4.22–5.81)
RDW: 23.4 % — ABNORMAL HIGH (ref 11.5–15.5)
WBC: 10.5 10*3/uL (ref 4.0–10.5)

## 2013-08-19 LAB — TROPONIN I: Troponin I: <0.3 ng/mL (ref <0.30)

## 2013-08-19 LAB — PRO B NATRIURETIC PEPTIDE: Pro B Natriuretic peptide (BNP): 8266 pg/mL — ABNORMAL HIGH (ref 0–125)

## 2013-08-19 LAB — PREPARE RBC (CROSSMATCH)

## 2013-08-19 MED ORDER — ACETAMINOPHEN 325 MG PO TABS
650.0000 mg | ORAL_TABLET | Freq: Once | ORAL | Status: AC
  Administered 2013-08-19: 650 mg via ORAL
  Filled 2013-08-19: qty 2

## 2013-08-19 MED ORDER — DIPHENHYDRAMINE HCL 25 MG PO CAPS
25.0000 mg | ORAL_CAPSULE | Freq: Once | ORAL | Status: DC
  Filled 2013-08-19: qty 1

## 2013-08-19 MED ORDER — ASPIRIN EC 81 MG PO TBEC
81.0000 mg | DELAYED_RELEASE_TABLET | Freq: Every day | ORAL | Status: DC
  Administered 2013-08-20 – 2013-08-22 (×3): 81 mg via ORAL
  Filled 2013-08-19 (×4): qty 1

## 2013-08-19 MED ORDER — FUROSEMIDE 10 MG/ML IJ SOLN
40.0000 mg | Freq: Once | INTRAMUSCULAR | Status: AC
  Administered 2013-08-19: 40 mg via INTRAVENOUS
  Filled 2013-08-19: qty 4

## 2013-08-19 MED ORDER — ACETAMINOPHEN 325 MG PO TABS: 650.0000 mg | ORAL_TABLET | Freq: Once | ORAL | Status: DC

## 2013-08-19 MED ORDER — ENOXAPARIN SODIUM 30 MG/0.3ML ~~LOC~~ SOLN: 30.0000 mg | SUBCUTANEOUS | Status: DC

## 2013-08-19 MED ORDER — FUROSEMIDE 40 MG PO TABS: 40.0000 mg | ORAL_TABLET | Freq: Two times a day (BID) | ORAL | Status: DC

## 2013-08-19 MED ORDER — ACETAMINOPHEN 325 MG PO TABS
650.0000 mg | ORAL_TABLET | Freq: Four times a day (QID) | ORAL | Status: DC | PRN
  Administered 2013-08-20: 650 mg via ORAL
  Filled 2013-08-19: qty 2

## 2013-08-19 MED ORDER — FUROSEMIDE 10 MG/ML IJ SOLN
40.0000 mg | Freq: Two times a day (BID) | INTRAMUSCULAR | Status: DC
  Administered 2013-08-19 – 2013-08-20 (×2): 40 mg via INTRAVENOUS
  Filled 2013-08-19 (×5): qty 4

## 2013-08-19 MED ORDER — ISOSORBIDE MONONITRATE ER 30 MG PO TB24
30.0000 mg | ORAL_TABLET | Freq: Every day | ORAL | Status: DC
  Administered 2013-08-20 – 2013-08-22 (×3): 30 mg via ORAL
  Filled 2013-08-19 (×4): qty 1

## 2013-08-19 MED ORDER — METOLAZONE 2.5 MG PO TABS: 2.5000 mg | ORAL_TABLET | ORAL | Status: DC | PRN

## 2013-08-19 MED ORDER — CARVEDILOL 25 MG PO TABS
25.0000 mg | ORAL_TABLET | Freq: Two times a day (BID) | ORAL | Status: DC
  Administered 2013-08-20 – 2013-08-22 (×5): 25 mg via ORAL
  Filled 2013-08-19 (×7): qty 1

## 2013-08-19 MED ORDER — ONDANSETRON HCL 4 MG/2ML IJ SOLN: 4.0000 mg | Freq: Four times a day (QID) | INTRAMUSCULAR | Status: DC | PRN

## 2013-08-19 NOTE — Addendum Note (Signed)
Encounter addended by: Conrad Winfield, NP on: 08/19/2013  8:36 PM<BR>     Documentation filed: Notes Section

## 2013-08-19 NOTE — Progress Notes (Addendum)
Patient ID: Patrick [name], male   DOB: 11/10/1955, 58 y.o.   MRN: [id number]    Weight Range    145-148 pounds  Baseline proBNP   993.1 on 01/19/12  PCP: Patrick [name] Sickle Cell : Patrick [name]  Opthalmologist: Patrick [name]  Guide IT Trial   HPI: Patrick [name] is a 58 y.o. African American male with a history of  sickle cell anemia, systolic HF due to NICM EF 40% 11/2012 initially diagnosed 05/2011, no CAD by cath 05/2011, CKD stage III, legally blind, and tobacco abuse.   Readmitted 11/18/12 from HF clinic with voume overload. Hemoglobin 6.7 Transfused 2 UPRBC. Diuresed with IV lasix. Lisinopril stopped due to CKD. Discharge weight 143 pounds.    Admitted 02/07/13 through 02/12/13 after being treated for N/V/D, and abdominal pain. CT of abdomen- cholelithias with plans to f/u for elective cholecystectomy. Received 2UPRBC.  Hemoglobin 6.1>7.1.  Diuretics held due to dehydration. He restarted lasix 60 mg bid 02/13/13.   He returns for follow up with his cousin. Last visit hydralazine was increased 75 mg  three times a day. Denies SOB/PND. + Orthopnea sleeps on 2 pillows. Complains of fatigue. Unable to weigh at home due to his vision.  Says he has been taking an extra 40 mg of lasix for the last 3 days. Urine output picked up but still feels bloated.  Taking all medications.  Smoking 5-6  cigarettes a day. Following low salt diet. Drinking > 2 liters per day. Working with [facility] for [employer]   01/17/12 ECHO 55%-60%  11/20/12 ECHO EF  40% Peak PA pressure 56  Labs        02/17/13: K+ 5.0, BUN 45, Creatinine 2.2, proBNP 10135       05/21/13 K 5.4 Creatinine 2.25 Pro BNP 3076        06/09/13 K 4.6 Creatinine 2.1 Pro BNP 10,727        07/07/13 Creatinine 2.06 K 5.0  He is not on Sprironolactone due to hyperkalemia. He is not on ace inhibitor due to renal failure.     SH: Lives with Patrick [name] and cousin in [place]. Smokes 5 -6 cigarettes per day.   ROS: All systems negative except as  listed in HPI, PMH and Problem List.  Past Medical History  Diagnosis Date  . Swelling of extremity, right   . Swelling abdomen   . Swelling of extremity, left   . CHF (congestive heart failure)   . Blood transfusion     "today is the second time I've had a blood transfusion" (11/21/2012)  . Sickle cell anemia   . Blood dyscrasia   . Exertional shortness of breath   . Legally blind     Current Outpatient Prescriptions  Medication Sig Dispense Refill  . aspirin EC 81 MG tablet Take 81 mg by mouth daily.      . carvedilol (COREG) 25 MG tablet Take 1 tablet (25 mg total) by mouth 2 (two) times daily with a meal.  60 tablet  6  . furosemide (LASIX) 40 MG tablet Take 1 tablet (40 mg total) by mouth daily. 40 mg daily and as needed  50 tablet  6  . hydrALAZINE (APRESOLINE) 50 MG tablet Take 1.5 tablets (75 mg total) by mouth 3 (three) times daily.  135 tablet  6  . isosorbide mononitrate (IMDUR) 30 MG 24 hr tablet Take 1 tablet (30 mg total) by mouth daily.  30 tablet  6   No current  facility-administered medications for this encounter.    Filed Vitals:   08/19/13 1358  BP: 112/52  Pulse: 75  Weight: 155 lb 4 oz (70.421 kg)  SpO2: 92%    PHYSICAL EXAM: General:  Chronically ill appearing.  No resp difficulty with his cousin HEENT: normal except for mild scleral icterus Neck: supple. JVP to jaw.  +prominent CV waves. Carotids 2+ bilaterally; no bruits. No lymphadenopathy or thryomegaly appreciated. Cor: PMI normal Regular rate & rhythm. No rubs, gallops 2/6 TR and 2/6 MR Lungs: clear    Abdomen: soft, nontender, + distended. No hepatosplenomegaly. No bruits or masses. Good bowel sounds. Extremities: no cyanosis, rash,  edema ,  + clubbing. Neuro: alert & orientedx3, cranial nerves grossly intact. Moves all 4 extremities w/o difficulty. Affect pleasant.   ASSESSMENT & PLAN: 1. Chronic Systolic Heart Failure -NICM  Cath 05/2011 ECHO 11/20/12 EF 40% (previous 55% 01/2012). He is  legally blind and he is unable to weigh because he cant see the numbers on the scale. - NYHA II symptoms. Volume status elevated. Increase lasix 40 mg twice a day. Give one dose 2.5 mg Metolazone  - On  goal dose carvedilol 25 mg twice a day.  - Continue hydralazine to 75 mg tid. Continue Imdur 30 mg daily. - He is not on Ace/Arb/spiro due to hyperkalemia/ CKD - Reinforced daily weights, low salt food choices, and limiting fluid intake to , 2 liters perday - Check BMET/Pro BNP today with Guide-IT Study   2. HTN- Stable. Continue hydralazine to 75 mg tid. Continue current dose of carvedilol, and Imdur.   3 Sickle Cell- follow up with Patrick [name].  4. Current smoker- declines smoking cessation.  Encouraged to stop smoking 5. Legally Blind- Followed by Patrick [name]. [facility] following. Hopefully can get "talking scale" for daily weights. Patrick [name] to fax order to [facility].  6. Fatgiue- Check CBC now   Follow up in 3 weeks.  Patrick Moralez NP-C 2:01 PM  Hemoglobin 5.9. Called and instructed to present to [facility] ED.

## 2013-08-19 NOTE — ED Provider Notes (Addendum)
CSN: [id number]     Arrival date & time 08/19/13  1724 History   First MD Initiated Contact with Patient 08/19/13 1750     Chief Complaint  Patient presents with  . Abnormal Lab     (Consider location/radiation/quality/duration/timing/severity/associated sxs/prior Treatment) HPI Comments: Patient presents to the ER at the request of his cardiologist. Patient has a history of congestive heart failure. He is currently on a study protocol. He reports that he gets blood work performed every 2 weeks. He was seen today in the cardiologist's office and had blood work performed. After he left the office, however, he was called and told to go to the ER because his hemoglobin was low.  Patient has not been experiencing any chest pain. He does report that he has had a 5 pound weight gain. He feels like he has been experiencing increased fluid in his abdomen, which is what he feels when he has congestive heart failure. He's been experiencing increasing shortness of breath, orthopnea and dyspnea on exertion. He has been increasing his Lasix at home, taking an extra 40 mg tablet for the last 3 days. He has had some improved urine output, but no improvement of the symptoms.   Past Medical History  Diagnosis Date  . Swelling of extremity, right   . Swelling abdomen   . Swelling of extremity, left   . CHF (congestive heart failure)   . Blood transfusion     "today is the second time I've had a blood transfusion" (11/21/2012)  . Sickle cell anemia   . Blood dyscrasia   . Exertional shortness of breath   . Legally blind    Past Surgical History  Procedure Laterality Date  . No past surgeries     Family History  Problem Relation Age of Onset  . Alcohol abuse Mother   . Liver disease Mother   . Cirrhosis Mother    History  Substance Use Topics  . Smoking status: Current Every Day Smoker -- 0.25 packs/day for 40 years    Types: Cigarettes  . Smokeless tobacco: Never Used  . Alcohol Use: 0.0 oz/week      Comment: 11/21/2012 "used to drink a beer q now and then; last beer ~ 3 months ago"    Review of Systems  Respiratory: Positive for shortness of breath.   Cardiovascular: Negative for chest pain.  Neurological: Positive for weakness.  All other systems reviewed and are negative.      Allergies  Review of patient's allergies indicates no known allergies.  Home Medications   Current Outpatient Rx  Name  Route  Sig  Dispense  Refill  . aspirin EC 81 MG tablet   Oral   Take 81 mg by mouth daily.         . carvedilol (COREG) 25 MG tablet   Oral   Take 1 tablet (25 mg total) by mouth 2 (two) times daily with a meal.   60 tablet   6     Please file dose change.   . furosemide (LASIX) 40 MG tablet   Oral   Take 1 tablet (40 mg total) by mouth 2 (two) times daily.   60 tablet   6   . hydrALAZINE (APRESOLINE) 50 MG tablet   Oral   Take 1.5 tablets (75 mg total) by mouth 3 (three) times daily.   135 tablet   6     Please file dose change   . isosorbide mononitrate (IMDUR) 30 MG 24  hr tablet   Oral   Take 1 tablet (30 mg total) by mouth daily.   30 tablet   6   . metolazone (ZAROXOLYN) 2.5 MG tablet   Oral   Take 1 tablet (2.5 mg total) by mouth as needed.   8 tablet   6    BP 128/75  Pulse 87  Temp(Src) 97.7 F (36.5 C) (Oral)  Resp 16  Ht 5\' 10"  (1.778 m)  Wt 155 lb (70.308 kg)  BMI 22.24 kg/m2  SpO2 92% Physical Exam  Constitutional: He is oriented to person, place, and time. He appears well-developed and well-nourished. No distress.  HENT:  Head: Normocephalic and atraumatic.  Right Ear: Hearing normal.  Left Ear: Hearing normal.  Nose: Nose normal.  Mouth/Throat: Oropharynx is clear and moist and mucous membranes are normal.  Eyes: Conjunctivae and EOM are normal. Pupils are equal, round, and reactive to light.  Neck: Normal range of motion. Neck supple. JVD present.  Cardiovascular: Regular rhythm, S1 normal, S2 normal and intact distal  pulses.  Exam reveals no gallop and no friction rub.   No murmur heard. Pulmonary/Chest: Effort normal and breath sounds normal. No respiratory distress. He exhibits no tenderness.  Abdominal: Soft. Normal appearance and bowel sounds are normal. He exhibits distension. There is no hepatosplenomegaly. There is no tenderness. There is no rebound, no guarding, no tenderness at McBurney's point and negative Murphy's sign. No hernia.  Musculoskeletal: Normal range of motion.  Neurological: He is alert and oriented to person, place, and time. He has normal strength. No cranial nerve deficit or sensory deficit. Coordination normal. GCS eye subscore is 4. GCS verbal subscore is 5. GCS motor subscore is 6.  Skin: Skin is warm, dry and intact. No rash noted. No cyanosis.  Psychiatric: He has a normal mood and affect. His speech is normal and behavior is normal. Thought content normal.    ED Course  Procedures (including critical care time) Labs Review Labs Reviewed  CBC WITH DIFFERENTIAL - Abnormal; Notable for the following:    RBC 1.70 (*)    Hemoglobin 5.9 (*)    HCT 15.1 (*)    MCH 34.7 (*)    MCHC 38.4 (*)    RDW 23.4 (*)    Monocytes Relative 15 (*)    Eosinophils Relative 6 (*)    Monocytes Absolute 1.6 (*)    All other components within normal limits  COMPREHENSIVE METABOLIC PANEL - Abnormal; Notable for the following:    CO2 18 (*)    Glucose, Bld 109 (*)    BUN 68 (*)    Creatinine, Ser 2.51 (*)    Calcium 8.2 (*)    AST 43 (*)    Alkaline Phosphatase 119 (*)    Total Bilirubin 2.9 (*)    GFR calc non Af Amer 27 (*)    GFR calc Af Amer 32 (*)    All other components within normal limits  PRO B NATRIURETIC PEPTIDE - Abnormal; Notable for the following:    Pro B Natriuretic peptide (BNP) 8266.0 (*)    All other components within normal limits  TROPONIN I  TYPE AND SCREEN   Imaging Review Dg Chest 2 View  08/19/2013   CLINICAL DATA:  Short of breath for 4 days.  CHF.  EXAM:  CHEST  2 VIEW  COMPARISON:  DG CHEST 2 VIEW dated 03/31/2013  FINDINGS: Mild CHF is present. There is cardiomegaly, pulmonary vascular congestion and interstitial pulmonary edema. Narrow a  be tiny effusions with blunting of the costophrenic angles on the lateral view. No focal consolidation.  IMPRESSION: Mild CHF with interstitial pulmonary edema.   Electronically Signed   By: Dereck Ligas M.D.   On: 08/19/2013 19:15     EKG Interpretation None      Date: 08/19/2013  Rate: 86  Rhythm: normal sinus rhythm  QRS Axis: normal  Intervals: normal  ST/T Wave abnormalities: nonspecific ST/T changes  Conduction Disutrbances:none  Narrative Interpretation:   Old EKG Reviewed: unchanged    MDM   Final diagnoses:  Anemia  CHF (congestive heart failure)    Patient presents to the ER at the request of his cardiologist. The patient was seen for routine followup today and felt to be exhibiting evidence of decompensated congestive heart failure. Lasix was increased. T. bloodwork was performed. Patient was called at home and told that his hemoglobin was low and he needed to come to the ER for further evaluation. Repeat evaluation here in the ER reveals jugular venous distention without any distress. He is not significantly hypoxic, oxygenation 90-93%. Blood pressure is normal. Repeat hemoglobin was 5.9 here in the ER.  BNP is elevated above his baseline. Chest x-ray is consistent with edema. Patient administered Lasix 40 milligrams IV. Will require hospitalization for further management of acute congestive heart failure as well as transfusion for anemia.  Discussed with Doctor Elias Else, on-call for cardiology. He will consult on the patient, but with the patient's concomitant anemia and sickle cell disease, he asked for medicine admission.   Orpah Greek, MD 08/19/13 1953  Orpah Greek, MD 08/19/13 2016

## 2013-08-19 NOTE — Patient Instructions (Signed)
Take 2.5 mg Metolazone today   Take lasix 40 mg twice a day  Do the following things EVERYDAY: 1) Weigh yourself in the morning before breakfast. Write it down and keep it in a log. 2) Take your medicines as prescribed 3) Eat low salt foods-Limit salt (sodium) to 2000 mg per day.  4) Stay as active as you can everyday 5) Limit all fluids for the day to less than 2 liters

## 2013-08-19 NOTE — H&P (Signed)
[facility] History and Physical  [name] Patrick [name] DOB: [birth year] DOA: 08/19/2013  Referring physician:EDP PCP: MATTHEWS,MICHELLE A., MD   Chief Complaint: shortness of breath  HPI: Patrick [name] is a 58 y.o. male with NICM Ef of 40%, HTN, CKD3, Gout, presents to the ER with the above complaint. Patient reports feeling tired for the last week, along with progressive shortness of breath and 5 pounds weight gain in the last week. He denies any lower extremity edema but reports progressive abdominal distention. He doubled his dose of Lasix for the last 4 days at home without much benefit He has been taking Aleve yesterday for the last 2 weeks due to a recent gout flare. In ER, and hemoglobin 5.9, down from recent hb of 7, chest x-ray and BNP concerning for congestive heart failure. patient denies any hematemesis melena or hematochezia.   Review of Systems:  Constitutional:  No weight loss, night sweats, Fevers, chills, fatigue.  HEENT:  No headaches, Difficulty swallowing,Tooth/dental problems,Sore throat,  No sneezing, itching, ear ache, nasal congestion, post nasal drip,  Cardio-vascular:  No chest pain, Orthopnea, PND, swelling in lower extremities, anasarca, dizziness, palpitations  GI:  No heartburn, indigestion, abdominal pain, nausea, vomiting, diarrhea, change in bowel habits, loss of appetite  Resp:  No shortness of breath with exertion or at rest. No excess mucus, no productive cough, No non-productive cough, No coughing up of blood.No change in color of mucus.No wheezing.No chest wall deformity  Skin:  no rash or lesions.  GU:  no dysuria, change in color of urine, no urgency or frequency. No flank pain.  Musculoskeletal:  No joint pain or swelling. No decreased range of motion. No back pain.  Psych:  No change in mood or affect. No depression or anxiety. No memory loss.   Past Medical History  Diagnosis Date  . Swelling of extremity,  right   . Swelling abdomen   . Swelling of extremity, left   . CHF (congestive heart failure)   . Blood transfusion     "today is the second time I've had a blood transfusion" (11/21/2012)  . Sickle cell anemia   . Blood dyscrasia   . Exertional shortness of breath   . Legally blind    Past Surgical History  Procedure Laterality Date  . No past surgeries     Social History:  reports that he has been smoking Cigarettes.  He has a 10 pack-year smoking history. He has never used smokeless tobacco. He reports that he drinks alcohol. He reports that he does not use illicit drugs.  No Known Allergies  Family History  Problem Relation Age of Onset  . Alcohol abuse Mother   . Liver disease Mother   . Cirrhosis Mother      Prior to Admission medications   Medication Sig Start Date End Date Taking? Authorizing Provider  aspirin EC 81 MG tablet Take 81 mg by mouth daily.   Yes Historical Provider, MD  carvedilol (COREG) 25 MG tablet Take 1 tablet (25 mg total) by mouth 2 (two) times daily with a meal. 06/23/13  Yes Amy D [name], NP  furosemide (LASIX) 40 MG tablet Take 1 tablet (40 mg total) by mouth 2 (two) times daily. 08/19/13  Yes Amy D [name], NP  hydrALAZINE (APRESOLINE) 50 MG tablet Take 1.5 tablets (75 mg total) by mouth 3 (three) times daily. 07/21/13  Yes Amy D [name], NP  isosorbide mononitrate (IMDUR) 30 MG 24 hr tablet Take 1 tablet (30  mg total) by mouth daily. 06/23/13  Yes Amy D [name], NP  metolazone (ZAROXOLYN) 2.5 MG tablet Take 1 tablet (2.5 mg total) by mouth as needed. 08/19/13  Yes Amy D Ninfa Meeker, NP   Physical Exam: Filed Vitals:   08/19/13 2141  BP: 129/76  Pulse: 79  Temp: 97.4 F (36.3 C)  Resp: 18    BP 129/76  Pulse 79  Temp(Src) 97.4 F (36.3 C) (Oral)  Resp 18  Ht 5\' 10"  (1.778 m)  Wt 67.858 kg (149 lb 9.6 oz)  BMI 21.47 kg/m2  SpO2 97%  General:  Appears calm and comfortable Eyes: PERRL, normal lids, irises & conjunctiva ENT: grossly normal hearing, lips &  tongue Neck: JVD noted masses or thyromegaly Cardiovascular: RRR, no m/r/g. No LE edema. Telemetry: SR, no arrhythmias  Respiratory: fine crackles B/L Abdomen: soft, NT, distended, Bs present Skin: no rash or induration seen on limited exam Musculoskeletal: grossly normal tone BUE/BLE Psychiatric: grossly normal mood and affect, speech fluent and appropriate Neurologic: grossly non-focal.          Labs on Admission:  Basic Metabolic Panel:  Recent Labs Lab 08/19/13 1825  NA 138  K 4.5  CL 103  CO2 18*  GLUCOSE 109*  BUN 68*  CREATININE 2.51*  CALCIUM 8.2*   Liver Function Tests:  Recent Labs Lab 08/19/13 1825  AST 43*  ALT 18  ALKPHOS 119*  BILITOT 2.9*  PROT 6.8  ALBUMIN 3.7   No results found for this basename: LIPASE, AMYLASE,  in the last 168 hours No results found for this basename: AMMONIA,  in the last 168 hours CBC:  Recent Labs Lab 08/19/13 1417 08/19/13 1825  WBC 9.2 10.5  NEUTROABS  --  6.0  HGB 5.9* 5.9*  HCT 15.4* 15.1*  MCV 89.5 88.8  PLT 163 157   Cardiac Enzymes:  Recent Labs Lab 08/19/13 1825  TROPONINI <0.30    BNP (last 3 results)  Recent Labs  11/20/12 1737 08/19/13 1825  PROBNP 6715.0* 8266.0*   CBG: No results found for this basename: GLUCAP,  in the last 168 hours  Radiological Exams on Admission: Dg Chest 2 View  08/19/2013   CLINICAL DATA:  Short of breath for 4 days.  CHF.  EXAM: CHEST  2 VIEW  COMPARISON:  DG CHEST 2 VIEW dated 03/31/2013  FINDINGS: Mild CHF is present. There is cardiomegaly, pulmonary vascular congestion and interstitial pulmonary edema. Narrow a be tiny effusions with blunting of the costophrenic angles on the lateral view. No focal consolidation.  IMPRESSION: Mild CHF with interstitial pulmonary edema.   Electronically Signed   By: Dereck Ligas M.D.   On: 08/19/2013 19:15    Assessment/Plan  1. Acute on chronic Systolic CHF/NICM EF [id number] -exacerbated by anemia, NSAIDs -diurese with IV  lasix 40mg  Q12 -no ACE/ARB due to CKD -transfuse 1 unit pRBC -cards consulted per EDP -continue coreg  2. Anemia -has SCD with recent baseline of 7 -acute on chronic, Hb down by about 1 gram -transfuse 1 unit PRBC, anemia panel -hemoccult stool  3. CKD -creatinine slightly up, to FU with nephrology -advised stopping NSAIDs -monitor with Blood and diuresis  4. Tobacco use -counseled  5. HTN -stable, continue imdur  DVT proph: SCDs till bleeding ruled out  Code Status: Full Code Family Communication: d/w pt and family at bedside Disposition Plan: inpatient  Time spent: 75min  Tzvi Economou [facility] Pager [phone]

## 2013-08-19 NOTE — Telephone Encounter (Signed)
   Contacted Patrick [name] regarding CBC results.    Hemoglobin 5.9   Instructed to go to [facility] ED now. Patrick [name] verbalized understanding.   Alexys Gassett NP-C  3:50 PM

## 2013-08-19 NOTE — ED Notes (Addendum)
Pt sent here by DR. Bensimon for hemoglobin of 5.6. Pt reports weakness x 4 days, SOB with exertion x 1 week. Denies blood in urine/stool. Denies pain. AO x4. Ambulates independently. Lungs clear, unlabored. Hx: CHF, sickle cell

## 2013-08-19 NOTE — ED Notes (Signed)
Called report to unit.

## 2013-08-19 NOTE — Consult Note (Signed)
CARDIOLOGY CONSULT NOTE  Patient ID: SHEIKH HOMEWOOD, MRN: [id number], DOB/AGE: 1955-10-27 58 y.o. Admit date: 08/19/2013 Date of Consult: 08/19/2013  Primary Physician: MATTHEWS,MICHELLE A., MD Primary Cardiologist: Dr. Haroldine Laws  Chief Complaint: low hematocrit Reason for Consultation: assistance with HF  HPI: 58 y.o. male w/ PMHx significant for NICM with EF of 40%, sickle cell anemia, CKD2, tobacco abuse who presented to [facility] on 08/19/2013 with complaints of being instructed to present due to significant anemia.   Was seen in CHF clinic today and noted to be fluid overloaded. Lasix was increased to 40 bid and metolazone x 1 added but prior to this being done, he had CBC performed and the significant anemia prompted referral to the ER.  See clinic note for excellent details. Briefly, up 5 lbs from base of 150. Increased fatigue and abdominal bloated feeling. No sig peripheral edema.   No blood loss. No change in urine color.  Admits to recent gout flare and use of OTC aleve. Was not aware that NSAIDs were to be avoided.  In ER, was given 40 IV lasix. Receiving 1 unit PRBC blood now and was pretreated with additional 40 IV lasix.  Past Medical History  Diagnosis Date  . Swelling of extremity, right   . Swelling abdomen   . Swelling of extremity, left   . CHF (congestive heart failure)   . Blood transfusion     "today is the second time I've had a blood transfusion" (11/21/2012)  . Sickle cell anemia   . Blood dyscrasia   . Exertional shortness of breath   . Legally blind       Surgical History:  Past Surgical History  Procedure Laterality Date  . No past surgeries       Home Meds: Prior to Admission medications   Medication Sig Start Date End Date Taking? Authorizing Provider  aspirin EC 81 MG tablet Take 81 mg by mouth daily.   Yes Historical Provider, MD  carvedilol (COREG) 25 MG tablet Take 1 tablet (25 mg total) by mouth 2 (two) times daily with a meal.  06/23/13  Yes Amy D [name], NP  furosemide (LASIX) 40 MG tablet Take 1 tablet (40 mg total) by mouth 2 (two) times daily. 08/19/13  Yes Amy D [name], NP  hydrALAZINE (APRESOLINE) 50 MG tablet Take 1.5 tablets (75 mg total) by mouth 3 (three) times daily. 07/21/13  Yes Amy D [name], NP  isosorbide mononitrate (IMDUR) 30 MG 24 hr tablet Take 1 tablet (30 mg total) by mouth daily. 06/23/13  Yes Amy D [name], NP  metolazone (ZAROXOLYN) 2.5 MG tablet Take 1 tablet (2.5 mg total) by mouth as needed. 08/19/13  Yes Amy Estrella Deeds, NP    Inpatient Medications:  . [START ON 08/20/2013] aspirin EC  81 mg Oral Daily  . [START ON 08/20/2013] carvedilol  25 mg Oral BID WC  . furosemide  40 mg Intravenous BID  . [START ON 08/20/2013] isosorbide mononitrate  30 mg Oral Daily      Allergies: No Known Allergies  History   Social History  . Marital Status: Divorced    Spouse Name: N/A    Number of Children: N/A  . Years of Education: N/A   Occupational History  . Not on file.   Social History Main Topics  . Smoking status: Current Every Day Smoker -- 0.25 packs/day for 40 years    Types: Cigarettes  . Smokeless tobacco: Never Used  . Alcohol Use: 0.0 oz/week  Comment: 11/21/2012 "used to drink a beer q now and then; last beer ~ 3 months ago"  . Drug Use: No  . Sexual Activity: Not Currently   Other Topics Concern  . Not on file   Social History Narrative  . No narrative on file     Family History  Problem Relation Age of Onset  . Alcohol abuse Mother   . Liver disease Mother   . Cirrhosis Mother      Review of Systems: General: negative for chills, fever, night sweats or weight changes.  Cardiovascular: see HPI Dermatological: negative for rash Respiratory: negative for cough or wheezing Urologic: negative for hematuria Abdominal: negative for nausea, vomiting, diarrhea, bright red blood per rectum, melena, or hematemesis Neurologic: negative for visual changes, syncope, or dizziness All other  systems reviewed and are otherwise negative except as noted above.  Labs:  Recent Labs  08/19/13 1825  TROPONINI <0.30   Lab Results  Component Value Date   WBC 10.5 08/19/2013   HGB 5.9* 08/19/2013   HCT 15.1* 08/19/2013   MCV 88.8 08/19/2013   PLT 157 08/19/2013    Recent Labs Lab 08/19/13 1825  NA 138  K 4.5  CL 103  CO2 18*  BUN 68*  CREATININE 2.51*  CALCIUM 8.2*  PROT 6.8  BILITOT 2.9*  ALKPHOS 119*  ALT 18  AST 43*  GLUCOSE 109*   No results found for this basename: CHOL, HDL, LDLCALC, TRIG   No results found for this basename: DDIMER    Radiology/Studies:  Dg Chest 2 View  08/19/2013   CLINICAL DATA:  Short of breath for 4 days.  CHF.  EXAM: CHEST  2 VIEW  COMPARISON:  DG CHEST 2 VIEW dated 03/31/2013  FINDINGS: Mild CHF is present. There is cardiomegaly, pulmonary vascular congestion and interstitial pulmonary edema. Narrow a be tiny effusions with blunting of the costophrenic angles on the lateral view. No focal consolidation.  IMPRESSION: Mild CHF with interstitial pulmonary edema.   Electronically Signed   By: Dereck Ligas M.D.   On: 08/19/2013 19:15    EKG: sinus, noisy baseline, no ST or TW changes, qt on long side but overcalled by EKG  Physical Exam: Blood pressure 129/78, pulse 75, temperature 97.6 F (36.4 C), temperature source Oral, resp. rate 18, height 5\' 10"  (1.778 m), weight 67.858 kg (149 lb 9.6 oz), SpO2 96.00%. General: Well developed, well nourished, in no acute distress. Head: Normocephalic, atraumatic, blind, no xanthomas, nares are without discharge.  Neck: Supple. Negative for carotid bruits. External JVD clearly elevated Lungs: Clear bilaterally to auscultation without wheezes, rales, or rhonchi. Breathing is unlabored. Heart: RRR with S1 S2. No murmurs, rubs, or gallops appreciated. Abdomen: protuberant. Msk:  Strength and tone appear normal for age. Extremities: No clubbing or cyanosis. No edema.  Distal pedal pulses are 2+ and equal  bilaterally. Pale nail beds. Neuro: Alert and oriented X 3. Moves all extremities spontaneously. Psych:  Responds to questions appropriately with a normal affect.   1. CHF with reduced EF ~35%, acutely decompensated 2. Sickle cell anemia, currently very anemic, likely multifactorial 3. CKD III 4. Use of NSAIDs 5. Blind 6. Tobacco abuse  Assessment and Plan:  59 y.o. male w/ PMHx significant for NICM with EF of 40%, sickle cell anemia, CKD2, tobacco abuse who presented to [facility] on 08/19/2013 with CHF and severe anemia.  History (5# gain) and exam (eJVP) suggest volume overload. Likely trigger is use of NSAIDs and significant  anemia which can provoke significant fluid retention.  Agree with lasix dosing peri-blood infusion. Would continue IV dose in the AM tomorrow and if UOP and weight loss is good (-2 L and 2-3 lbs) could return to oral dosing. Monitor lytes closely during IV diuresis.  Continue home regimen of nitrates and hydralazine. Continue aspirin.  Defer to primary team regarding further evaluation of anemia in the setting of sickle cell.  Educated pt regarding avoiding all NSAIDs. Smoking cessation also strongly advised.  Thank you for this consult. We will follow up tomorrow. Please call with questions.      Signed, Elias Else, Mila Homer MD 08/19/2013, 10:44 PM

## 2013-08-20 ENCOUNTER — Other Ambulatory Visit: Payer: Self-pay

## 2013-08-20 ENCOUNTER — Encounter (HOSPITAL_COMMUNITY): Payer: Self-pay | Admitting: *Deleted

## 2013-08-20 DIAGNOSIS — I10 Essential (primary) hypertension: Secondary | ICD-10-CM

## 2013-08-20 DIAGNOSIS — D571 Sickle-cell disease without crisis: Secondary | ICD-10-CM

## 2013-08-20 LAB — BASIC METABOLIC PANEL
BUN: 68 mg/dL — ABNORMAL HIGH (ref 6–23)
CO2: 17 mEq/L — ABNORMAL LOW (ref 19–32)
Calcium: 8.1 mg/dL — ABNORMAL LOW (ref 8.4–10.5)
Chloride: 105 mEq/L (ref 96–112)
Creatinine, Ser: 2.57 mg/dL — ABNORMAL HIGH (ref 0.50–1.35)
GFR calc Af Amer: 31 mL/min — ABNORMAL LOW (ref >90)
GFR calc non Af Amer: 26 mL/min — ABNORMAL LOW (ref >90)
Glucose, Bld: 84 mg/dL (ref 70–99)
Potassium: 4.5 mEq/L (ref 3.7–5.3)
Sodium: 141 mEq/L (ref 137–147)

## 2013-08-20 LAB — CBC
HCT: 17.1 % — ABNORMAL LOW (ref 39.0–52.0)
Hemoglobin: 6.4 g/dL — CL (ref 13.0–17.0)
MCH: 33.3 pg (ref 26.0–34.0)
MCHC: 37.4 g/dL — ABNORMAL HIGH (ref 30.0–36.0)
MCV: 89.1 fL (ref 78.0–100.0)
Platelets: 149 10*3/uL — ABNORMAL LOW (ref 150–400)
RBC: 1.92 MIL/uL — ABNORMAL LOW (ref 4.22–5.81)
RDW: 20.6 % — ABNORMAL HIGH (ref 11.5–15.5)
WBC: 9 10*3/uL (ref 4.0–10.5)

## 2013-08-20 LAB — FOLATE: Folate: 7.2 ng/mL

## 2013-08-20 LAB — VITAMIN B12: Vitamin B-12: 339 pg/mL (ref 211–911)

## 2013-08-20 LAB — FERRITIN: Ferritin: 213 ng/mL (ref 22–322)

## 2013-08-20 LAB — RETICULOCYTES
RBC.: 1.92 MIL/uL — ABNORMAL LOW (ref 4.22–5.81)
Retic Count, Absolute: 301.4 10*3/uL — ABNORMAL HIGH (ref 19.0–186.0)
Retic Ct Pct: 15.7 % — ABNORMAL HIGH (ref 0.4–3.1)

## 2013-08-20 MED ORDER — PREDNISONE 20 MG PO TABS
40.0000 mg | ORAL_TABLET | Freq: Every day | ORAL | Status: AC
  Administered 2013-08-20 – 2013-08-22 (×3): 40 mg via ORAL
  Filled 2013-08-20 (×3): qty 2

## 2013-08-20 MED ORDER — FUROSEMIDE 10 MG/ML IJ SOLN: 60.0000 mg | Freq: Two times a day (BID) | INTRAMUSCULAR | Status: DC

## 2013-08-20 MED ORDER — FUROSEMIDE 10 MG/ML IJ SOLN
10.0000 mg/h | INTRAVENOUS | Status: DC
  Administered 2013-08-20: 10 mg/h via INTRAVENOUS
  Administered 2013-08-20: 4 mg/h via INTRAVENOUS
  Filled 2013-08-20 (×4): qty 25

## 2013-08-20 NOTE — Progress Notes (Signed)
CRITICAL VALUE ALERT  Critical value received:  Hemoglobin 6.4  Date of notification:  08/20/2013  Time of notification:  0648  Critical value read back:yes  Nurse who received alert:  K[place]  MD notified (1st page):  Alanda Slim  Time of first page:  [phone]  MD notified (2nd page):  Time of second page:  Responding MD:  Awaiting for the reply  Time MD responded:  Still awaiting

## 2013-08-20 NOTE — Progress Notes (Signed)
Pt a/o, c/o hand pain PRN tylenol given, pt started on po prednisone for gout, pt started on lasix gtt @ 10 ml/hr, vss, pt stable

## 2013-08-20 NOTE — Progress Notes (Addendum)
[facility] PROGRESS NOTE Interim History: 58 y.o. male with NICM Ef of 40%, HTN, CKD3, Gout, presents to the ER with the above complaint. Patient reports feeling tired for the last week, along with progressive shortness of breath and 5 pounds weight gain in the last week. He denies any lower extremity edema but reports progressive abdominal distention. He doubled his dose of Lasix for the last 4 days at home without much benefit.He has been taking Aleve yesterday for the last 2 weeks due to a recent gout flare  Filed Weights   08/19/13 1750 08/19/13 2141 08/20/13 0336  Weight: 70.308 kg (155 lb) 67.858 kg (149 lb 9.6 oz) 67.5 kg (148 lb 13 oz)        Intake/Output Summary (Last 24 hours) at 08/20/13 1039 Last data filed at 08/20/13 1020  Gross per 24 hour  Intake 1727.5 ml  Output   3620 ml  Net -1892.5 ml     Assessment/Plan: Acute on chronic combined systolic and diastolic CHF (congestive heart failure): - Change lasix to ggt. Gain 5 lbs weight in the last week. - Daily weight, monitor electrolytes and fluid restrict diet. - 70.3 kg->67.5 kg. - Lower extremity edema, crackles B/L with + JVD.  Sickle cell anemia - Hbg on admission 5.9 S/P 1 unit is 6.4. - Baseline Hbg around 7.0. - No pain.  Gout - Improved. - Will benefit from steroids instead of NSAID's int he future.  CKD (chronic kidney disease), stage III - At baseline 2.0-2.2.  HTN (hypertension) - stable. - cont coreg and Imdur.    Code Status: Full Code  Family Communication: d/w pt and family at bedside  Disposition Plan: inpatient    Consultants:  none  Procedures: ECHO 6.5.2014: estimated ejection fraction was in the range of 40%. All 4 cardiac chambers dilated.   Antibiotics:  none  HPI/Subjective: SOB mildly improved  Objective: Filed Vitals:   08/20/13 0132 08/20/13 0336 08/20/13 0747 08/20/13 1011  BP: 123/73  120/71 120/74  Pulse: 78  83 67  Temp: 98 F (36.7 C)       TempSrc: Oral     Resp: 18     Height:      Weight:  67.5 kg (148 lb 13 oz)    SpO2: 97%        Exam:  General: Alert, awake, oriented x3, in no acute distress.  HEENT: No bruits, no goiter. +JVD Heart: Regular rate and rhythm, without murmurs, rubs, gallops.  Lungs: Good air movement, crackles at bases. Abdomen: Soft, nontender, nondistended, positive bowel sounds.   Data Reviewed: Basic Metabolic Panel:  Recent Labs Lab 08/19/13 1825 08/20/13 0556  NA 138 141  K 4.5 4.5  CL 103 105  CO2 18* 17*  GLUCOSE 109* 84  BUN 68* 68*  CREATININE 2.51* 2.57*  CALCIUM 8.2* 8.1*   Liver Function Tests:  Recent Labs Lab 08/19/13 1825  AST 43*  ALT 18  ALKPHOS 119*  BILITOT 2.9*  PROT 6.8  ALBUMIN 3.7   No results found for this basename: LIPASE, AMYLASE,  in the last 168 hours No results found for this basename: AMMONIA,  in the last 168 hours CBC:  Recent Labs Lab 08/19/13 1417 08/19/13 1825 08/20/13 0556  WBC 9.2 10.5 9.0  NEUTROABS  --  6.0  --   HGB 5.9* 5.9* 6.4*  HCT 15.4* 15.1* 17.1*  MCV 89.5 88.8 89.1  PLT 163 157 149*   Cardiac Enzymes:  Recent Labs Lab  08/19/13 1825  TROPONINI <0.30   BNP (last 3 results)  Recent Labs  11/20/12 1737 08/19/13 1825  PROBNP 6715.0* 8266.0*   CBG: No results found for this basename: GLUCAP,  in the last 168 hours  No results found for this or any previous visit (from the past 240 hour(s)).   Studies: Dg Chest 2 View  08/19/2013   CLINICAL DATA:  Short of breath for 4 days.  CHF.  EXAM: CHEST  2 VIEW  COMPARISON:  DG CHEST 2 VIEW dated 03/31/2013  FINDINGS: Mild CHF is present. There is cardiomegaly, pulmonary vascular congestion and interstitial pulmonary edema. Narrow a be tiny effusions with blunting of the costophrenic angles on the lateral view. No focal consolidation.  IMPRESSION: Mild CHF with interstitial pulmonary edema.   Electronically Signed   By: Dereck Ligas M.D.   On: 08/19/2013 19:15     Scheduled Meds: . aspirin EC  81 mg Oral Daily  . carvedilol  25 mg Oral BID WC  . furosemide  40 mg Intravenous BID  . isosorbide mononitrate  30 mg Oral Daily   Continuous Infusions:    Charlynne Cousins  [facility] Pager [phone] If 8PM-8AM, please contact night-coverage at [email or web address], password [facility] 08/20/2013, 10:39 AM  LOS: 1 day

## 2013-08-20 NOTE — Progress Notes (Addendum)
Advanced Heart Failure Rounding Note   Subjective:   HPI:  Mr. Hout is a 58 y.o. African American male with a history of sickle cell anemia, systolic HF due to NICM  In 2012 EF 40% 11/2012 and EF improved to 55-60% in 2013 but EF was back down to 2014 ~40%., CKD stage III, legally blind, gout, and tobacco abuse.  Evaluated in HF clinic yesterday and had volume overload. He was instructed to take 2.5 mg Metolazone. Weight was up 5-6  Pounds from baseline.   Admitted yesterday with volume overload and anemia -->Hemoglobin 5.9. He received 40 mg IV lasix and received 1UPRBCs. Weight down 6 pounds. Now on lasix gtt at 4mg /hr. Denies SOB. Still feels bloated. L arm still painful with gout.   Hgb 5.9>6.4 Creatinine 2.5>2.57    Objective:   Weight Range:  Vital Signs:   Temp:  [97.4 F (36.3 C)-98.1 F (36.7 C)] 98 F (36.7 C) (03/05 0132) Pulse Rate:  [67-90] 67 (03/05 1011) Resp:  [16-18] 18 (03/05 0132) BP: (112-140)/(52-81) 120/74 mmHg (03/05 1011) SpO2:  [92 %-98 %] 97 % (03/05 0132) Weight:  [148 lb 13 oz (67.5 kg)-155 lb 4 oz (70.421 kg)] 148 lb 13 oz (67.5 kg) (03/05 0336) Last BM Date: 08/19/13  Weight change: Filed Weights   08/19/13 1750 08/19/13 2141 08/20/13 0336  Weight: 155 lb (70.308 kg) 149 lb 9.6 oz (67.858 kg) 148 lb 13 oz (67.5 kg)    Intake/Output:   Intake/Output Summary (Last 24 hours) at 08/20/13 1232 Last data filed at 08/20/13 1020  Gross per 24 hour  Intake 1727.5 ml  Output   3620 ml  Net -1892.5 ml     Physical Exam: General:  Well appearing. No resp difficulty HEENT: normal Neck: supple. JVP to jaw . Carotids 2+ bilat; no bruits. No lymphadenopathy or thryomegaly appreciated. Cor: PMI nondisplaced. Regular rate & rhythm. 2/6 SEM LSB Lungs: clear Abdomen: soft, nontender, + distended. No hepatosplenomegaly. No bruits or masses. Good bowel sounds. Extremities: no cyanosis, clubbing, rash, edema Neuro: alert & orientedx3, cranial nerves  grossly intact. moves all 4 extremities w/o difficulty. Affect pleasant  Telemetry: SR  Labs: Basic Metabolic Panel:  Recent Labs Lab 08/19/13 1825 08/20/13 0556  NA 138 141  K 4.5 4.5  CL 103 105  CO2 18* 17*  GLUCOSE 109* 84  BUN 68* 68*  CREATININE 2.51* 2.57*  CALCIUM 8.2* 8.1*    Liver Function Tests:  Recent Labs Lab 08/19/13 1825  AST 43*  ALT 18  ALKPHOS 119*  BILITOT 2.9*  PROT 6.8  ALBUMIN 3.7   No results found for this basename: LIPASE, AMYLASE,  in the last 168 hours No results found for this basename: AMMONIA,  in the last 168 hours  CBC:  Recent Labs Lab 08/19/13 1417 08/19/13 1825 08/20/13 0556  WBC 9.2 10.5 9.0  NEUTROABS  --  6.0  --   HGB 5.9* 5.9* 6.4*  HCT 15.4* 15.1* 17.1*  MCV 89.5 88.8 89.1  PLT 163 157 149*    Cardiac Enzymes:  Recent Labs Lab 08/19/13 1825  TROPONINI <0.30    BNP: BNP (last 3 results)  Recent Labs  11/20/12 1737 08/19/13 1825  PROBNP 6715.0* 8266.0*     Other results:    Imaging: Dg Chest 2 View  08/19/2013   CLINICAL DATA:  Short of breath for 4 days.  CHF.  EXAM: CHEST  2 VIEW  COMPARISON:  DG CHEST 2 VIEW dated 03/31/2013  FINDINGS:  Mild CHF is present. There is cardiomegaly, pulmonary vascular congestion and interstitial pulmonary edema. Narrow a be tiny effusions with blunting of the costophrenic angles on the lateral view. No focal consolidation.  IMPRESSION: Mild CHF with interstitial pulmonary edema.   Electronically Signed   By: Dereck Ligas M.D.   On: 08/19/2013 19:15      Medications:     Scheduled Medications: . aspirin EC  81 mg Oral Daily  . carvedilol  25 mg Oral BID WC  . isosorbide mononitrate  30 mg Oral Daily     Infusions: . furosemide (LASIX) infusion       PRN Medications:  acetaminophen, ondansetron (ZOFRAN) IV   Assessment:  1. A/C systolic CHF with reduced EF ~35% 2. Sickle cell anemia, currently very anemic, likely multifactorial  3. Acute on  chronic renal failure III  4. Use of NSAIDs  5. Acute gout 6. Tobacco abuse   Plan/Discussion:    Volume status remains elevated. Will increase lasix gtt to 10mg /hr (may need more than that with CKD). Add prednisone for acute gout. Anemia per primary team. We will follow closely. Need to restart hydralazine prior to d/c, No ACE/ARB due to renal failure.   Length of Stay: 1  Zarea Diesing,MD 1:31 PM  Advanced Heart Failure Team Pager [phone] (M-F; 7a - 4p)  Please contact [facility] for night-coverage after hours (4p -7a ) and weekends on [email or web address]

## 2013-08-21 LAB — TYPE AND SCREEN
ABO/RH(D): AB POS
Antibody Screen: NEGATIVE
Unit division: 0

## 2013-08-21 LAB — BASIC METABOLIC PANEL
BUN: 64 mg/dL — ABNORMAL HIGH (ref 6–23)
CO2: 18 mEq/L — ABNORMAL LOW (ref 19–32)
Calcium: 8.2 mg/dL — ABNORMAL LOW (ref 8.4–10.5)
Chloride: 102 mEq/L (ref 96–112)
Creatinine, Ser: 2.28 mg/dL — ABNORMAL HIGH (ref 0.50–1.35)
GFR calc Af Amer: 35 mL/min — ABNORMAL LOW (ref >90)
GFR calc non Af Amer: 31 mL/min — ABNORMAL LOW (ref >90)
Glucose, Bld: 165 mg/dL — ABNORMAL HIGH (ref 70–99)
Potassium: 4.1 mEq/L (ref 3.7–5.3)
Sodium: 136 mEq/L — ABNORMAL LOW (ref 137–147)

## 2013-08-21 LAB — IRON AND TIBC
Iron: 89 ug/dL (ref 42–135)
Saturation Ratios: 45 % (ref 20–55)
TIBC: 198 ug/dL — ABNORMAL LOW (ref 215–435)
UIBC: 109 ug/dL — ABNORMAL LOW (ref 125–400)

## 2013-08-21 MED ORDER — ALLOPURINOL 100 MG PO TABS
200.0000 mg | ORAL_TABLET | Freq: Every day | ORAL | Status: DC
  Administered 2013-08-21 – 2013-08-22 (×2): 200 mg via ORAL
  Filled 2013-08-21 (×2): qty 2

## 2013-08-21 NOTE — Progress Notes (Signed)
Advanced Heart Failure Rounding Note   Subjective:   HPI:  [name] is a 58 y.o. African American male with a history of sickle cell anemia, systolic HF due to NICM  In 2012 EF 40% 11/2012 and EF improved to 55-60% in 2013 but EF was back down to 2014 ~40%., CKD stage III, legally blind, gout, and tobacco abuse.  Evaluated in HF clinic yesterday and had volume overload. He was instructed to take 2.5 mg Metolazone. Weight was up 5-6  Pounds from baseline.   Admitted volume overload and anemia -->Hemoglobin 5.9. He received 40 mg IV lasix and received 1UPRBCs. Later placed on Lasix drip at 10 mg per hour. L hand pain improved with prednisone. Brisk diuresis. Weight down 4 pounds. Overall weight down 9 pounds.    Hgb 5.9>6.4 Creatinine 2.5>2.57 >2.28   Objective:   Weight Range:  Vital Signs:   Temp:  [97.5 F (36.4 C)-98.6 F (37 C)] 98 F (36.7 C) (03/06 0415) Pulse Rate:  [66-79] 72 (03/06 0415) Resp:  [18-20] 19 (03/06 0415) BP: (112-120)/(60-74) 112/60 mmHg (03/06 0415) SpO2:  [92 %-100 %] 97 % (03/06 0415) Weight:  [144 lb 1.6 oz (65.363 kg)] 144 lb 1.6 oz (65.363 kg) (03/06 0415) Last BM Date: 08/20/13  Weight change: Filed Weights   08/19/13 2141 08/20/13 0336 08/21/13 0415  Weight: 149 lb 9.6 oz (67.858 kg) 148 lb 13 oz (67.5 kg) 144 lb 1.6 oz (65.363 kg)    Intake/Output:   Intake/Output Summary (Last 24 hours) at 08/21/13 0806 Last data filed at 08/21/13 0418  Gross per 24 hour  Intake   1140 ml  Output   4525 ml  Net  -3385 ml     Physical Exam: General:  Well appearing. No resp difficulty.  Standing in the room  HEENT: normal Neck: supple. JVP 8-9 prominent CV waves. Carotids 2+ bilat; no bruits. No lymphadenopathy or thryomegaly appreciated. Cor: PMI nondisplaced. Regular rate & rhythm. 2/6 SEM LSB Lungs: clear Abdomen: soft, nontender, nondistended. No hepatosplenomegaly. No bruits or masses. Good bowel sounds. Extremities: no cyanosis, clubbing,  rash, edema Neuro: alert & orientedx3, cranial nerves grossly intact. moves all 4 extremities w/o difficulty. Affect pleasant  Telemetry: SR 70s  Labs: Basic Metabolic Panel:  Recent Labs Lab 08/19/13 1825 08/20/13 0556 08/21/13 0354  NA 138 141 136*  K 4.5 4.5 4.1  CL 103 105 102  CO2 18* 17* 18*  GLUCOSE 109* 84 165*  BUN 68* 68* 64*  CREATININE 2.51* 2.57* 2.28*  CALCIUM 8.2* 8.1* 8.2*    Liver Function Tests:  Recent Labs Lab 08/19/13 1825  AST 43*  ALT 18  ALKPHOS 119*  BILITOT 2.9*  PROT 6.8  ALBUMIN 3.7   No results found for this basename: LIPASE, AMYLASE,  in the last 168 hours No results found for this basename: AMMONIA,  in the last 168 hours  CBC:  Recent Labs Lab 08/19/13 1417 08/19/13 1825 08/20/13 0556  WBC 9.2 10.5 9.0  NEUTROABS  --  6.0  --   HGB 5.9* 5.9* 6.4*  HCT 15.4* 15.1* 17.1*  MCV 89.5 88.8 89.1  PLT 163 157 149*    Cardiac Enzymes:  Recent Labs Lab 08/19/13 1825  TROPONINI <0.30    BNP: BNP (last 3 results)  Recent Labs  11/20/12 1737 08/19/13 1825  PROBNP 6715.0* 8266.0*     Other results:    Imaging: Dg Chest 2 View  08/19/2013   CLINICAL DATA:  Short of breath  for 4 days.  CHF.  EXAM: CHEST  2 VIEW  COMPARISON:  DG CHEST 2 VIEW dated 03/31/2013  FINDINGS: Mild CHF is present. There is cardiomegaly, pulmonary vascular congestion and interstitial pulmonary edema. Narrow a be tiny effusions with blunting of the costophrenic angles on the lateral view. No focal consolidation.  IMPRESSION: Mild CHF with interstitial pulmonary edema.   Electronically Signed   By: Dereck Ligas M.D.   On: 08/19/2013 19:15     Medications:     Scheduled Medications: . aspirin EC  81 mg Oral Daily  . carvedilol  25 mg Oral BID WC  . isosorbide mononitrate  30 mg Oral Daily  . predniSONE  40 mg Oral Q breakfast    Infusions: . furosemide (LASIX) infusion 10 mg/hr (08/20/13 1418)    PRN Medications: acetaminophen,  ondansetron (ZOFRAN) IV   Assessment:  1. A/C systolic CHF with reduced EF ~35% 2. Sickle cell anemia, currently very anemic, likely multifactorial  3. Acute on chronic renal failure III  4. Use of NSAIDs  5. Acute gout 6. Tobacco abuse   Plan/Discussion:    Volume status improving. Brisk diuresis. Continue lasix drip on more day and transition lasix po 80 mg twice a day tomorrow. Will need to restart hydralazine tomorrow. Renal function stable. Follow up in HF clinic 08/28/13 at 10:00  Day # 2/3 of prednisone. Start allopurinol 200 daily  Will need sickle cell follow up at discharge.     Length of Stay: 2  [name],AMY, NP-C 8:06 AM  Advanced Heart Failure Team Pager [phone] (M-F; 7a - 4p)  Please contact [facility] for night-coverage after hours (4p -7a ) and weekends on [email or web address]  Patient seen and examined with [name], NP. We discussed all aspects of the encounter. I agree with the assessment and plan as stated above.   Volume status much improved on higher dose lasix. Will continue one more day. Suspect we can switch to po diuretics (80 bid) tomorrow. Continue 3 day steroid burst for gout and add allopurinol. Hopefully home tomorrow.   Lilley Hubble,MD 1:42 PM

## 2013-08-21 NOTE — Progress Notes (Signed)
Pt a/o, no c/o pain, pt on lasix gtt @ 10 ml/hr, vss, pt stable

## 2013-08-21 NOTE — Progress Notes (Signed)
[facility] PROGRESS NOTE Interim History: 57 y.o. male with NICM Ef of 40%, HTN, CKD3, Gout, presents to the ER with the above complaint. Patient reports feeling tired for the last week, along with progressive shortness of breath and 5 pounds weight gain in the last week. He denies any lower extremity edema but reports progressive abdominal distention. He doubled his dose of Lasix for the last 4 days at home without much benefit.He has been taking Aleve yesterday for the last 2 weeks due to a recent gout flare  Filed Weights   08/19/13 2141 08/20/13 0336 08/21/13 0415  Weight: 67.858 kg (149 lb 9.6 oz) 67.5 kg (148 lb 13 oz) 65.363 kg (144 lb 1.6 oz)        Intake/Output Summary (Last 24 hours) at 08/21/13 1019 Last data filed at 08/21/13 0800  Gross per 24 hour  Intake    960 ml  Output   3525 ml  Net  -2565 ml     Assessment/Plan: Acute on chronic combined systolic and diastolic CHF (congestive heart failure): - Contlasix to ggt. Gain 5 lbs weight in the last week. - Daily weight, monitor electrolytes and fluid restrict diet. - 70.3 kg->67.5 kg. - + JVD.  Sickle cell anemia - Hbg on admission 5.9 S/P 1 unit is 6.4. - Baseline Hbg around 7.0. - No pain.  Gout - Improved. - Cont. steroids.  CKD (chronic kidney disease), stage III - At baseline 2.0-2.2.  HTN (hypertension) - stable. - cont coreg and Imdur.    Code Status: Full Code  Family Communication: d/w pt and family at bedside  Disposition Plan: inpatient    Consultants:  none  Procedures: ECHO 6.5.2014: estimated ejection fraction was in the range of 40%. All 4 cardiac chambers dilated.   Antibiotics:  none  HPI/Subjective: No complains  Objective: Filed Vitals:   08/20/13 2040 08/21/13 0203 08/21/13 0415 08/21/13 0957  BP: 119/69 114/67 112/60 135/75  Pulse: 71 79 72 72  Temp: 97.5 F (36.4 C) 97.9 F (36.6 C) 98 F (36.7 C)   TempSrc: Oral Oral Oral   Resp: 18 20 19      Height:      Weight:   65.363 kg (144 lb 1.6 oz)   SpO2: 99% 92% 97%      Exam:  General: Alert, awake, oriented x3, in no acute distress.  HEENT: No bruits, no goiter. + JVD Heart: Regular rate and rhythm, without murmurs, rubs, gallops.  Lungs: Good air movement, clear Abdomen: Soft, nontender, nondistended, positive bowel sounds.   Data Reviewed: Basic Metabolic Panel:  Recent Labs Lab 08/19/13 1825 08/20/13 0556 08/21/13 0354  NA 138 141 136*  K 4.5 4.5 4.1  CL 103 105 102  CO2 18* 17* 18*  GLUCOSE 109* 84 165*  BUN 68* 68* 64*  CREATININE 2.51* 2.57* 2.28*  CALCIUM 8.2* 8.1* 8.2*   Liver Function Tests:  Recent Labs Lab 08/19/13 1825  AST 43*  ALT 18  ALKPHOS 119*  BILITOT 2.9*  PROT 6.8  ALBUMIN 3.7   No results found for this basename: LIPASE, AMYLASE,  in the last 168 hours No results found for this basename: AMMONIA,  in the last 168 hours CBC:  Recent Labs Lab 08/19/13 1417 08/19/13 1825 08/20/13 0556  WBC 9.2 10.5 9.0  NEUTROABS  --  6.0  --   HGB 5.9* 5.9* 6.4*  HCT 15.4* 15.1* 17.1*  MCV 89.5 88.8 89.1  PLT 163 157 149*   Cardiac  Enzymes:  Recent Labs Lab 08/19/13 1825  TROPONINI <0.30   BNP (last 3 results)  Recent Labs  11/20/12 1737 08/19/13 1825  PROBNP 6715.0* 8266.0*   CBG: No results found for this basename: GLUCAP,  in the last 168 hours  No results found for this or any previous visit (from the past 240 hour(s)).   Studies: Dg Chest 2 View  08/19/2013   CLINICAL DATA:  Short of breath for 4 days.  CHF.  EXAM: CHEST  2 VIEW  COMPARISON:  DG CHEST 2 VIEW dated 03/31/2013  FINDINGS: Mild CHF is present. There is cardiomegaly, pulmonary vascular congestion and interstitial pulmonary edema. Narrow a be tiny effusions with blunting of the costophrenic angles on the lateral view. No focal consolidation.  IMPRESSION: Mild CHF with interstitial pulmonary edema.   Electronically Signed   By: Dereck Ligas M.D.   On:  08/19/2013 19:15    Scheduled Meds: . aspirin EC  81 mg Oral Daily  . carvedilol  25 mg Oral BID WC  . isosorbide mononitrate  30 mg Oral Daily  . predniSONE  40 mg Oral Q breakfast   Continuous Infusions: . furosemide (LASIX) infusion 10 mg/hr (08/20/13 1418)     Charlynne Cousins  [facility] Pager [phone] If 8PM-8AM, please contact night-coverage at [email or web address], password [facility] 08/21/2013, 10:19 AM  LOS: 2 days

## 2013-08-21 NOTE — Progress Notes (Signed)
Pharmacist Heart Failure Core Measure Documentation  Assessment: Patrick [name] has an EF documented as 35-40% on 11/20/12 by ECHO.  Rationale: Heart failure patients with left ventricular systolic dysfunction (LVSD) and an EF < 40% should be prescribed an angiotensin converting enzyme inhibitor (ACEI) or angiotensin receptor blocker (ARB) at discharge unless a contraindication is documented in the medical record.  This patient is not currently on an ACEI or ARB for HF.  This note is being placed in the record in order to provide documentation that a contraindication to the use of these agents is present for this encounter.  ACE Inhibitor or Angiotensin Receptor Blocker is contraindicated (specify all that apply)  []   ACEI allergy AND ARB allergy []   Angioedema []   Moderate or severe aortic stenosis []   Hyperkalemia []   Hypotension []   Renal artery stenosis [x]   Worsening renal function, preexisting renal disease or dysfunction   Albertina Parr, PharmD.  Clinical Pharmacist Pager [phone]

## 2013-08-22 DIAGNOSIS — D72829 Elevated white blood cell count, unspecified: Secondary | ICD-10-CM

## 2013-08-22 LAB — BASIC METABOLIC PANEL
BUN: 43 mg/dL — ABNORMAL HIGH (ref 6–23)
CO2: 30 mEq/L (ref 19–32)
Calcium: 9.1 mg/dL (ref 8.4–10.5)
Chloride: 98 mEq/L (ref 96–112)
Creatinine, Ser: 2.23 mg/dL — ABNORMAL HIGH (ref 0.50–1.35)
GFR calc Af Amer: 36 mL/min — ABNORMAL LOW (ref >90)
GFR calc non Af Amer: 31 mL/min — ABNORMAL LOW (ref >90)
Glucose, Bld: 140 mg/dL — ABNORMAL HIGH (ref 70–99)
Potassium: 4.1 mEq/L (ref 3.7–5.3)
Sodium: 141 mEq/L (ref 137–147)

## 2013-08-22 MED ORDER — HYDRALAZINE HCL 25 MG PO TABS: 25.0000 mg | ORAL_TABLET | Freq: Three times a day (TID) | ORAL | Status: DC

## 2013-08-22 MED ORDER — HYDRALAZINE HCL 25 MG PO TABS
25.0000 mg | ORAL_TABLET | Freq: Three times a day (TID) | ORAL | Status: DC
Start: 2013-08-22 — End: 2013-08-22
  Administered 2013-08-22: 25 mg via ORAL
  Filled 2013-08-22 (×4): qty 1

## 2013-08-22 MED ORDER — FUROSEMIDE 40 MG PO TABS: 80.0000 mg | ORAL_TABLET | Freq: Two times a day (BID) | ORAL | Status: DC

## 2013-08-22 NOTE — Progress Notes (Signed)
Advanced Heart Failure Rounding Note   Subjective:    HPI:  Patrick [name] is a 58 y.o. African American male with a history of sickle cell anemia, systolic HF due to NICM  In 2012 EF 40% 11/2012 and EF improved to 55-60% in 2013 but EF was back down to 2014 ~40%., CKD stage III, legally blind, gout, and tobacco abuse.  Evaluated in HF clinic yesterday and had volume overload. He was instructed to take 2.5 mg Metolazone. Weight was up 5-6  Pounds from baseline.   Admitted volume overload and anemia -->Hemoglobin 5.9. He received 40 mg IV lasix and received 1UPRBCs. Later placed on Lasix drip at 10 mg per hour. L hand pain improved with prednisone. Brisk diuresis continue. Weight down another 4 pounds. Overall weight down 15 pounds.    Hgb 5.9>6.4 Creatinine 2.5>2.57 >2.28   Objective:   Weight Range:  Vital Signs:   Temp:  [97.8 F (36.6 C)-98.6 F (37 C)] 97.8 F (36.6 C) (03/07 0621) Pulse Rate:  [65-85] 65 (03/07 0621) Resp:  [16-18] 16 (03/07 0621) BP: (116-135)/(69-75) 117/71 mmHg (03/07 0621) SpO2:  [93 %-97 %] 97 % (03/07 0621) Weight:  [63.504 kg (140 lb)] 63.504 kg (140 lb) (03/07 0621) Last BM Date: 08/20/13  Weight change: Filed Weights   08/20/13 0336 08/21/13 0415 08/22/13 0621  Weight: 67.5 kg (148 lb 13 oz) 65.363 kg (144 lb 1.6 oz) 63.504 kg (140 lb)    Intake/Output:   Intake/Output Summary (Last 24 hours) at 08/22/13 0900 Last data filed at 08/22/13 0851  Gross per 24 hour  Intake    940 ml  Output   3575 ml  Net  -2635 ml     Physical Exam: General:  Well appearing. No resp difficulty.  Standing in the room  HEENT: normal Neck: supple. JVP 6-7 prominent CV waves. Carotids 2+ bilat; no bruits. No lymphadenopathy or thryomegaly appreciated. Cor: PMI nondisplaced. Regular rate & rhythm. 2/6 SEM LSB Lungs: clear Abdomen: soft, nontender, nondistended. No hepatosplenomegaly. No bruits or masses. Good bowel sounds. Extremities: no cyanosis, clubbing,  rash, edema Neuro: alert & orientedx3, cranial nerves grossly intact. moves all 4 extremities w/o difficulty. Affect pleasant  Telemetry: SR 70s  Labs: Basic Metabolic Panel:  Recent Labs Lab 08/19/13 1825 08/20/13 0556 08/21/13 0354 08/22/13 0454  NA 138 141 136* 141  K 4.5 4.5 4.1 4.1  CL 103 105 102 98  CO2 18* 17* 18* 30  GLUCOSE 109* 84 165* 140*  BUN 68* 68* 64* 43*  CREATININE 2.51* 2.57* 2.28* 2.23*  CALCIUM 8.2* 8.1* 8.2* 9.1    Liver Function Tests:  Recent Labs Lab 08/19/13 1825  AST 43*  ALT 18  ALKPHOS 119*  BILITOT 2.9*  PROT 6.8  ALBUMIN 3.7   No results found for this basename: LIPASE, AMYLASE,  in the last 168 hours No results found for this basename: AMMONIA,  in the last 168 hours  CBC:  Recent Labs Lab 08/19/13 1417 08/19/13 1825 08/20/13 0556  WBC 9.2 10.5 9.0  NEUTROABS  --  6.0  --   HGB 5.9* 5.9* 6.4*  HCT 15.4* 15.1* 17.1*  MCV 89.5 88.8 89.1  PLT 163 157 149*    Cardiac Enzymes:  Recent Labs Lab 08/19/13 1825  TROPONINI <0.30    BNP: BNP (last 3 results)  Recent Labs  11/20/12 1737 08/19/13 1825  PROBNP 6715.0* 8266.0*     Other results:    Imaging: No results found.   Medications:  Scheduled Medications: . allopurinol  200 mg Oral Daily  . aspirin EC  81 mg Oral Daily  . carvedilol  25 mg Oral BID WC  . isosorbide mononitrate  30 mg Oral Daily    Infusions: . furosemide (LASIX) infusion 10 mg/hr (08/20/13 1418)    PRN Medications: acetaminophen, ondansetron (ZOFRAN) IV   Assessment:   1. A/C systolic CHF with reduced EF ~35% 2. Sickle cell anemia, currently very anemic, likely multifactorial  3. Acute on chronic renal failure III  4. Use of NSAIDs  5. Acute gout 6. Tobacco abuse   Plan/Discussion:    Volume status much improved. Weight now below baseline. Renal function stable. From our standpoint can go home on lasix po 80 mg twice a day today. Will restart hydralazine at 25  tid today (home dose 75 tid).Follow up in HF clinic 08/28/13 at 10:00  Day # 3/3 of prednisone. Started allopurinol 200 daily  Will need sickle cell follow up at discharge.    Length of Stay: 3 Glori Bickers, MD 9:00 AM Advanced Heart Failure Team Pager [phone] (M-F; 7a - 4p)  Please contact [facility] for night-coverage after hours (4p -7a ) and weekends on [email or web address]

## 2013-08-22 NOTE — Progress Notes (Signed)
The patient did not have any complaints of pain or acute changes overnight.  His urine output during the night shift was 2100 mL.

## 2013-08-22 NOTE — Discharge Summary (Signed)
Physician Discharge Summary  Patrick [name] [id number] DOB: 17-Aug-1955 DOA: 08/19/2013  PCP: MATTHEWS,MICHELLE A., MD  Admit date: 08/19/2013 Discharge date: 08/22/2013  Time spent: 35 minutes  Recommendations for Outpatient Follow-up:  1. Follow up with HF clinic in 1 week.  BNP    Component Value Date/Time   PROBNP 8266.0* 08/19/2013 1825   Filed Weights   08/20/13 0336 08/21/13 0415 08/22/13 [id number]  Weight: 67.5 kg (148 lb 13 oz) 65.363 kg (144 lb 1.6 oz) 63.504 kg (140 lb)     Discharge Diagnoses:  Principal Problem:   Acute on chronic combined systolic and diastolic CHF (congestive heart failure) Active Problems:   HTN (hypertension)   CKD (chronic kidney disease), stage III   Gout   Sickle cell anemia   Discharge Condition: Stable  Diet recommendation: low sodium  History of present illness:  58 y.o. male with NICM Ef of 40%, HTN, CKD3, Gout, presents to the ER with the above complaint.  Patient reports feeling tired for the last week, along with progressive shortness of breath and 5 pounds weight gain in the last week.  He denies any lower extremity edema but reports progressive abdominal distention.  He doubled his dose of Lasix for the last 4 days at home without much benefit  He has been taking Aleve yesterday for the last 2 weeks due to a recent gout flare.  In ER, and hemoglobin 5.9, down from recent hb of 7, chest x-ray and BNP concerning for congestive heart failure.  patient denies any hematemesis melena or hematochezia.   Hospital Course:  Acute on chronic combined systolic and diastolic CHF (congestive heart failure):  - Cont lasix to ggt. Change to oral once weight improved. - Daily weight, monitor electrolytes and fluid restrict diet.  - 70.3 kg -> 67.5 -> 63.5kg.   Sickle cell anemia  - Hbg on admission 5.9 S/P 1 unit is 6.4.  - Baseline Hbg around 7.0.  - No pain.   Gout  - Improved.  - Cont. steroids.   CKD (chronic kidney  disease), stage III  - At baseline 2.0-2.2.   HTN (hypertension)  - stable.  - cont coreg and Imdur.   Procedures:  CXR  Consultations:  cardiology  Discharge Exam: Filed Vitals:   08/22/13 0621  BP: 117/71  Pulse: 65  Temp: 97.8 F (36.6 C)  Resp: 16    General: A&O x3 Cardiovascular: RRR Respiratory: good air movement CTA B/L  Discharge Instructions      Discharge Orders   Future Appointments Provider Department Dept Phone   08/27/2013 3:00 PM Leana Gamer, MD [place] [phone]   08/28/2013 10:00 AM [facility] [place] [facility] [phone]   09/01/2013 12:00 PM [facility] 2 [place] [facility] [phone]   09/01/2013 12:00 PM [facility] [phone]   Future Orders Complete By Expires   Diet - low sodium heart healthy  As directed    Increase activity slowly  As directed        Medication List    STOP taking these medications       metolazone 2.5 MG tablet  Commonly known as:  ZAROXOLYN      TAKE these medications       aspirin EC 81 MG tablet  Take 81 mg by mouth daily.     carvedilol 25 MG tablet  Commonly known as:  COREG  Take 1 tablet (25 mg total) by mouth 2 (two) times daily with a meal.     furosemide 40 MG tablet  Commonly known as:  LASIX  Take 2 tablets (80 mg total) by mouth 2 (two) times daily.     hydrALAZINE 25 MG tablet  Commonly known as:  APRESOLINE  Take 1 tablet (25 mg total) by mouth every 8 (eight) hours.     isosorbide mononitrate 30 MG 24 hr tablet  Commonly known as:  IMDUR  Take 1 tablet (30 mg total) by mouth daily.       No Known Allergies Follow-up Information   Follow up with [name],AMY, NP On 08/28/2013. (at 10:00 [facility])    Specialty:  Nurse Practitioner   Contact information:   [address] [place]  [address] [phone]        The results of significant diagnostics from this hospitalization (including imaging, microbiology, ancillary and laboratory) are listed below for reference.    Significant Diagnostic Studies: Dg Chest 2 View  08/19/2013   CLINICAL DATA:  Short of breath for 4 days.  CHF.  EXAM: CHEST  2 VIEW  COMPARISON:  DG CHEST 2 VIEW dated 03/31/2013  FINDINGS: Mild CHF is present. There is cardiomegaly, pulmonary vascular congestion and interstitial pulmonary edema. Narrow a be tiny effusions with blunting of the costophrenic angles on the lateral view. No focal consolidation.  IMPRESSION: Mild CHF with interstitial pulmonary edema.   Electronically Signed   By: Dereck Ligas M.D.   On: 08/19/2013 19:15    Microbiology: No results found for this or any previous visit (from the past 240 hour(s)).   Labs: Basic Metabolic Panel:  Recent Labs Lab 08/19/13 1825 08/20/13 0556 08/21/13 0354 08/22/13 0454  NA 138 141 136* 141  K 4.5 4.5 4.1 4.1  CL 103 105 102 98  CO2 18* 17* 18* 30  GLUCOSE 109* 84 165* 140*  BUN 68* 68* 64* 43*  CREATININE 2.51* 2.57* 2.28* 2.23*  CALCIUM 8.2* 8.1* 8.2* 9.1   Liver Function Tests:  Recent Labs Lab 08/19/13 1825  AST 43*  ALT 18  ALKPHOS 119*  BILITOT 2.9*  PROT 6.8  ALBUMIN 3.7   No results found for this basename: LIPASE, AMYLASE,  in the last 168 hours No results found for this basename: AMMONIA,  in the last 168 hours CBC:  Recent Labs Lab 08/19/13 1417 08/19/13 1825 08/20/13 0556  WBC 9.2 10.5 9.0  NEUTROABS  --  6.0  --   HGB 5.9* 5.9* 6.4*  HCT 15.4* 15.1* 17.1*  MCV 89.5 88.8 89.1  PLT 163 157 149*   Cardiac Enzymes:  Recent Labs Lab 08/19/13 1825  TROPONINI <0.30   BNP: BNP (last 3 results)  Recent Labs  11/20/12 1737 08/19/13 1825  PROBNP 6715.0* 8266.0*   CBG: No results found for this basename: GLUCAP,  in the last 168 hours     Signed:  Charlynne Cousins  [facility] 08/22/2013, 9:50 AM

## 2013-08-27 ENCOUNTER — Non-Acute Institutional Stay (HOSPITAL_COMMUNITY)
Admission: AD | Admit: 2013-08-27 | Discharge: 2013-08-27 | Disposition: A | Discharge status: Home / Self Care | Payer: Medicaid Other | Source: Ambulatory Visit | Attending: Internal Medicine | Admitting: Internal Medicine

## 2013-08-27 ENCOUNTER — Encounter: Payer: Self-pay | Admitting: Internal Medicine

## 2013-08-27 ENCOUNTER — Ambulatory Visit (INDEPENDENT_AMBULATORY_CARE_PROVIDER_SITE_OTHER): Payer: Self-pay | Admitting: Internal Medicine

## 2013-08-27 VITALS — BP 119/75 | HR 76 | Temp 98.5°F | Resp 20 | Ht 70.0 in | Wt 146.0 lb

## 2013-08-27 DIAGNOSIS — D571 Sickle-cell disease without crisis: Secondary | ICD-10-CM | POA: Diagnosis not present

## 2013-08-27 DIAGNOSIS — L03039 Cellulitis of unspecified toe: Secondary | ICD-10-CM | POA: Diagnosis not present

## 2013-08-27 DIAGNOSIS — R7309 Other abnormal glucose: Secondary | ICD-10-CM

## 2013-08-27 DIAGNOSIS — Z7982 Long term (current) use of aspirin: Secondary | ICD-10-CM | POA: Insufficient documentation

## 2013-08-27 DIAGNOSIS — N183 Chronic kidney disease, stage 3 unspecified: Secondary | ICD-10-CM

## 2013-08-27 DIAGNOSIS — R52 Pain, unspecified: Secondary | ICD-10-CM | POA: Diagnosis not present

## 2013-08-27 DIAGNOSIS — L089 Local infection of the skin and subcutaneous tissue, unspecified: Secondary | ICD-10-CM

## 2013-08-27 DIAGNOSIS — I509 Heart failure, unspecified: Secondary | ICD-10-CM | POA: Diagnosis not present

## 2013-08-27 DIAGNOSIS — D5700 Hb-SS disease with crisis, unspecified: Secondary | ICD-10-CM

## 2013-08-27 DIAGNOSIS — N179 Acute kidney failure, unspecified: Secondary | ICD-10-CM | POA: Insufficient documentation

## 2013-08-27 DIAGNOSIS — L02619 Cutaneous abscess of unspecified foot: Secondary | ICD-10-CM | POA: Insufficient documentation

## 2013-08-27 DIAGNOSIS — Z79899 Other long term (current) drug therapy: Secondary | ICD-10-CM | POA: Diagnosis not present

## 2013-08-27 DIAGNOSIS — M549 Dorsalgia, unspecified: Secondary | ICD-10-CM | POA: Diagnosis not present

## 2013-08-27 DIAGNOSIS — D649 Anemia, unspecified: Secondary | ICD-10-CM | POA: Diagnosis not present

## 2013-08-27 DIAGNOSIS — R739 Hyperglycemia, unspecified: Secondary | ICD-10-CM

## 2013-08-27 DIAGNOSIS — L0291 Cutaneous abscess, unspecified: Secondary | ICD-10-CM

## 2013-08-27 DIAGNOSIS — L0390 Cellulitis, unspecified: Secondary | ICD-10-CM

## 2013-08-27 DIAGNOSIS — B351 Tinea unguium: Secondary | ICD-10-CM

## 2013-08-27 DIAGNOSIS — D57819 Other sickle-cell disorders with crisis, unspecified: Secondary | ICD-10-CM

## 2013-08-27 LAB — URINALYSIS, ROUTINE W REFLEX MICROSCOPIC
Bilirubin Urine: NEGATIVE
Glucose, UA: NEGATIVE mg/dL
Hgb urine dipstick: NEGATIVE
Ketones, ur: NEGATIVE mg/dL
Nitrite: NEGATIVE
Protein, ur: 100 mg/dL — AB
Specific Gravity, Urine: 1.009 (ref 1.005–1.030)
Urobilinogen, UA: 1 mg/dL (ref 0.0–1.0)
pH: 6 (ref 5.0–8.0)

## 2013-08-27 LAB — URINE MICROSCOPIC-ADD ON

## 2013-08-27 LAB — CBC WITH DIFFERENTIAL/PLATELET
Basophils Absolute: 0.1 10*3/uL (ref 0.0–0.1)
Basophils Relative: 1 % (ref 0–1)
Eosinophils Absolute: 0.6 10*3/uL (ref 0.0–0.7)
Eosinophils Relative: 6 % — ABNORMAL HIGH (ref 0–5)
HCT: 19.8 % — ABNORMAL LOW (ref 39.0–52.0)
Hemoglobin: 7.2 g/dL — ABNORMAL LOW (ref 13.0–17.0)
Lymphocytes Relative: 15 % (ref 12–46)
Lymphs Abs: 1.5 10*3/uL (ref 0.7–4.0)
MCH: 32.4 pg (ref 26.0–34.0)
MCHC: 36.4 g/dL — ABNORMAL HIGH (ref 30.0–36.0)
MCV: 89.2 fL (ref 78.0–100.0)
Monocytes Absolute: 1.9 10*3/uL — ABNORMAL HIGH (ref 0.1–1.0)
Monocytes Relative: 20 % — ABNORMAL HIGH (ref 3–12)
Neutro Abs: 5.6 10*3/uL (ref 1.7–7.7)
Neutrophils Relative %: 58 % (ref 43–77)
Platelets: 157 10*3/uL (ref 150–400)
RBC: 2.22 MIL/uL — ABNORMAL LOW (ref 4.22–5.81)
RDW: 19.9 % — ABNORMAL HIGH (ref 11.5–15.5)
WBC: 9.7 10*3/uL (ref 4.0–10.5)

## 2013-08-27 LAB — COMPREHENSIVE METABOLIC PANEL
ALT: 16 U/L (ref 0–53)
AST: 39 U/L — ABNORMAL HIGH (ref 0–37)
Albumin: 4 g/dL (ref 3.5–5.2)
Alkaline Phosphatase: 99 U/L (ref 39–117)
BUN: 65 mg/dL — ABNORMAL HIGH (ref 6–23)
CO2: 20 mEq/L (ref 19–32)
Calcium: 8.7 mg/dL (ref 8.4–10.5)
Chloride: 98 mEq/L (ref 96–112)
Creatinine, Ser: 2.78 mg/dL — ABNORMAL HIGH (ref 0.50–1.35)
GFR calc Af Amer: 28 mL/min — ABNORMAL LOW (ref >90)
GFR calc non Af Amer: 24 mL/min — ABNORMAL LOW (ref >90)
Glucose, Bld: 94 mg/dL (ref 70–99)
Potassium: 4.5 mEq/L (ref 3.7–5.3)
Sodium: 134 mEq/L — ABNORMAL LOW (ref 137–147)
Total Bilirubin: 2.7 mg/dL — ABNORMAL HIGH (ref 0.3–1.2)
Total Protein: 7.8 g/dL (ref 6.0–8.3)

## 2013-08-27 LAB — RETICULOCYTES
RBC.: 2.22 MIL/uL — ABNORMAL LOW (ref 4.22–5.81)
Retic Count, Absolute: 215.3 10*3/uL — ABNORMAL HIGH (ref 19.0–186.0)
Retic Ct Pct: 9.7 % — ABNORMAL HIGH (ref 0.4–3.1)

## 2013-08-27 LAB — ABO/RH: ABO/RH(D): AB POS

## 2013-08-27 LAB — PREPARE RBC (CROSSMATCH)

## 2013-08-27 MED ORDER — OXYCODONE-ACETAMINOPHEN 5-325 MG PO TABS: 0.5000 | ORAL_TABLET | Freq: Four times a day (QID) | ORAL | Status: DC | PRN

## 2013-08-27 MED ORDER — DEXTROSE-NACL 5-0.45 % IV SOLN
INTRAVENOUS | Status: DC
  Administered 2013-08-27: 16:00:00 via INTRAVENOUS

## 2013-08-27 MED ORDER — CEPHALEXIN 500 MG PO CAPS: 500.0000 mg | ORAL_CAPSULE | Freq: Two times a day (BID) | ORAL | Status: DC

## 2013-08-27 MED ORDER — HYDROMORPHONE HCL PF 2 MG/ML IJ SOLN
0.5000 mg | Freq: Once | INTRAMUSCULAR | Status: AC
  Administered 2013-08-27: 0.5 mg via INTRAVENOUS
  Filled 2013-08-27: qty 1

## 2013-08-27 MED ORDER — CEPHALEXIN 500 MG PO CAPS
500.0000 mg | ORAL_CAPSULE | Freq: Two times a day (BID) | ORAL | Status: DC
  Administered 2013-08-27: 500 mg via ORAL
  Filled 2013-08-27: qty 1

## 2013-08-27 MED ORDER — CEPHALEXIN 500 MG PO CAPS
500.0000 mg | ORAL_CAPSULE | Freq: Two times a day (BID) | ORAL | Status: DC
  Filled 2013-08-27: qty 1

## 2013-08-27 NOTE — Progress Notes (Signed)
Patient discharged home. VSS. Patient in no apparent distress. Prescriptions given. Discharge instructions reviewed. Instructed patient to leave blue blood band on until further instructions given tomorrow; patient to return to [facility]- primary care tomorrow at 12pm after Cardiologist appointment. Instructed patient to seek medical attention if there are any changes in condition. Patient acknowledges. Patient left day hospital ambulatory with belongings escorted by nursing staff.

## 2013-08-27 NOTE — Progress Notes (Addendum)
   Subjective:    Patient ID: Patrick [name], male    DOB: 24-Dec-1955, 58 y.o.   MRN: [id number]  HPI; Pt presents today for follow up and Physical Examination. However he is having pain in BLE's and back which he describes as throbbing and characteristic of his pain of sickle cell disease. The pain began 2 days ago and he tried to treat it with tylenol but with little relief. HE rates the pain as 7/10. The pain is non-radiating and has no other associated symptoms.   Pt also reports pain in his right great toe. He describes it as a sharp pain which has been constant in the last week.   He denies any SOB, CP, dizziness, F/C, N/V/D.    Review of Systems  Constitutional: Positive for fatigue.  HENT: Negative.   Eyes: Negative.   Respiratory: Negative.   Cardiovascular: Negative.   Gastrointestinal: Negative.   Endocrine: Negative.   Genitourinary: Negative.   Musculoskeletal: Positive for arthralgias, back pain and myalgias.  Skin: Negative.   Allergic/Immunologic: Negative.   Neurological: Negative.   Hematological: Negative.   Psychiatric/Behavioral: Negative.        Objective:   Physical Exam  Constitutional: He is oriented to person, place, and time. He appears well-developed and well-nourished. He appears distressed (mild distress secondary to pain.).  HENT:  Head: Atraumatic.  Eyes: Conjunctivae and EOM are normal. Pupils are equal, round, and reactive to light. No scleral icterus.  Neck: Normal range of motion. Neck supple.  Cardiovascular: Normal rate and regular rhythm.  Exam reveals no gallop and no friction rub.   No murmur heard. Pulmonary/Chest: Effort normal and breath sounds normal. He has no wheezes. He has no rales. He exhibits no tenderness.  Abdominal: Soft. Bowel sounds are normal. He exhibits no mass.  Musculoskeletal: Normal range of motion.  Neurological: He is alert and oriented to person, place, and time.  Skin: Skin is warm and dry.  Toes on both  feet with advanced fungal changes with blackening. Pt has an are of redness around the cuticle of the right great toe. There is also warmth and swelling present in that area.  Psychiatric: He has a normal mood and affect. His behavior is normal. Judgment and thought content normal.          Assessment & Plan:  1, Cellulitis: pt has cellulitis of the right great toe. I will treat with Keflex for 10 days.   2. Onychomycosis: Pt will likley need Terbenafine, however I will hold until we have obtained labs to check [place]. Refer to podiatrist.  3. Hyperglycemia: Review of labs while hospitalized shows hyperglycemia. Will Check Hb A1c to evaluate for diabetes.  4. CKD III: Pt had a GFR of 31 on last evaluation. Will Check renal function today and make decisions about further care.  5. CHF- Systolic and diastolic dysfunction: Pt currently not on an ACE-I as he has had problems with worsening renal failure and Hyperkalemia. I have spoken with Dr. Haroldine Laws his cardiologist and dependent on review of lab results we will consider a trial of low dose ACE-I.   6. Sickle Cell Anemia: Pt not on folic acid and last Hb was 6.7. Will check Hb today. Start on Folic acid.  7. Hb SS wit mild acute crisis: Will treat in the day hospital.

## 2013-08-27 NOTE — Discharge Summary (Signed)
[facility] Discharge Summary   Patient ID: Patrick [name] MRN: [id number] DOB/AGE: [birth year] 58 y.o.  Admit date: 08/27/2013 Discharge date: 08/27/2013  Primary Care Physician:  MATTHEWS,MICHELLE A., MD  Admission Diagnoses:  Active Problems:   Sickle cell anemia   Back pain, acute   Toe infection   Discharge Diagnoses:   Back pain Cellulitis, right great toe infection Sickle cell anemia  Discharge Medications:    Medication List    TAKE these medications       allopurinol 100 MG tablet  Commonly known as:  ZYLOPRIM  Take 100 mg by mouth daily.     aspirin EC 81 MG tablet  Take 81 mg by mouth daily.     cephALEXin 500 MG capsule  Commonly known as:  KEFLEX  Take 1 capsule (500 mg total) by mouth 2 (two) times daily.     furosemide 40 MG tablet  Commonly known as:  LASIX  Take 2 tablets (80 mg total) by mouth 2 (two) times daily.     hydrALAZINE 25 MG tablet  Commonly known as:  APRESOLINE  Take 50 mg by mouth 3 (three) times daily. 1.5 TAB(75MG  TOTAL)     isosorbide mononitrate 30 MG 24 hr tablet  Commonly known as:  IMDUR  Take 1 tablet (30 mg total) by mouth daily.     oxyCODONE-acetaminophen 5-325 MG per tablet  Commonly known as:  ROXICET  Take 0.5 tablets by mouth every 6 (six) hours as needed for severe pain.      ASK your doctor about these medications       carvedilol 25 MG tablet  Commonly known as:  COREG  Take 1 tablet (25 mg total) by mouth 2 (two) times daily with a meal.         Consults:  None  Significant Diagnostic Studies:  Dg Chest 2 View  08/19/2013   CLINICAL DATA:  Short of breath for 4 days.  CHF.  EXAM: CHEST  2 VIEW  COMPARISON:  DG CHEST 2 VIEW dated 03/31/2013  FINDINGS: Mild CHF is present. There is cardiomegaly, pulmonary vascular congestion and interstitial pulmonary edema. Narrow a be tiny effusions with blunting of the costophrenic angles on the lateral view. No focal consolidation.  IMPRESSION:  Mild CHF with interstitial pulmonary edema.   Electronically Signed   By: Dereck Ligas M.D.   On: 08/19/2013 19:15     [facility] Course: 1. Back pain: Admitted to day hospital for extended observation. Started D51/2 at [facility] and given 0.5 mg Dilaudid X 2. Pain intensity decreased from 6/10 to 3/10. Will go home on Oxycodone-acetaminophen 5-325, 0.5 tablet every 6 hours for moderate to severe pain # 30.   2. Anemia: CBC with differential reviewed. Hemoglobin 7.2, patient was transfused 1 unit of red blood cells without complication.  3. Acute renal failure: Labs discussed at length with patient. Will discontinue Allopurinol and Furosemide.  Patient will return to clinic on 08/28/2013 at 11:00  for labs and a follow up visit with Dr. Zigmund Daniel.   4.Cellulitus: Continue Keflex 500 mg every 12 hours for 10 days.    Physical Exam at Discharge:  BP 119/61  Pulse 84  Temp(Src) 97.7 F (36.5 C) (Oral)  Resp 18  SpO2 97%    Disposition at Discharge: 01-Home or Self Care  Discharge Orders:      Future Appointments Provider Department Dept Phone   08/28/2013 10:00 AM [facility] 2 [place]  [facility] [phone]   08/28/2013 10:00 AM [facility] [place] [facility] [phone]   09/01/2013 12:00 PM [facility] 2 [place] [facility] [phone]   09/01/2013 12:00 PM [facility] [place] [facility] [phone]      Condition at Discharge:   Stable  Time spent on Discharge:  Greater than 30 minutes.  Signed: Klever Twyford M 08/27/2013, 6:38 PM

## 2013-08-27 NOTE — H&P (Signed)
[facility] History and Physical   Date: 08/27/2013  Patient name: Patrick [name] Medical record number: [id number] Date of birth: December 07, 1955 Age: 58 y.o. Gender: male PCP: MATTHEWS,MICHELLE A., MD  Attending physician: Leana Gamer, MD  Chief Complaint: Back pain  History of Present Illness: 58 year old patient with a history of sickle cell disease. Pain is primarily in middle to lower back and right great toe. Pain described as 6/10 in intensity,intermittent,  throbbing and pounding in nature. Patient states that he took a Tylenol pm late on 08/26/2013, with minimal relief. Pain is worsened by activity. Reports that it is hard to get comfortable. Complaining of right great toe pain with wearing shoes, pain worsens with ambulation. Patient denies fever, shortness of breath, nausea, vomiting, and diarrhea.   Meds: Prescriptions prior to admission  Medication Sig Dispense Refill  . allopurinol (ZYLOPRIM) 100 MG tablet Take 100 mg by mouth daily.      Marland Kitchen aspirin EC 81 MG tablet Take 81 mg by mouth daily.      . carvedilol (COREG) 25 MG tablet Take 1 tablet (25 mg total) by mouth 2 (two) times daily with a meal.  60 tablet  6  . furosemide (LASIX) 40 MG tablet Take 2 tablets (80 mg total) by mouth 2 (two) times daily.  60 tablet  6  . hydrALAZINE (APRESOLINE) 25 MG tablet Take 50 mg by mouth 3 (three) times daily. 1.5 TAB(75MG  TOTAL)      . isosorbide mononitrate (IMDUR) 30 MG 24 hr tablet Take 1 tablet (30 mg total) by mouth daily.  30 tablet  6    Allergies: Review of patient's allergies indicates no known allergies. Past Medical History  Diagnosis Date  . Swelling of extremity, right   . Swelling abdomen   . Swelling of extremity, left   . CHF (congestive heart failure)   . Blood transfusion     "today is the second time I've had a blood transfusion" (11/21/2012)  . Sickle cell anemia   . Blood dyscrasia   . Exertional shortness of breath   . Legally blind     Past Surgical History  Procedure Laterality Date  . No past surgeries     Family History  Problem Relation Age of Onset  . Alcohol abuse Mother   . Liver disease Mother   . Cirrhosis Mother    History   Social History  . Marital Status: Divorced    Spouse Name: N/A    Number of Children: N/A  . Years of Education: N/A   Occupational History  . Not on file.   Social History Main Topics  . Smoking status: Current Every Day Smoker -- 0.25 packs/day for 40 years    Types: Cigarettes  . Smokeless tobacco: Never Used  . Alcohol Use: 0.0 oz/week     Comment: 11/21/2012 "used to drink a beer q now and then; last beer ~ 3 months ago"  . Drug Use: No  . Sexual Activity: Not Currently   Other Topics Concern  . Not on file   Social History Narrative  . No narrative on file    Review of Systems: Constitutional: positive for fatigue Eyes: Legally blind Ears, nose, mouth, throat, and face: negative Respiratory: negative Cardiovascular: negative Gastrointestinal: negative Genitourinary:negative Integument/breast: negative Hematologic/lymphatic: negative Musculoskeletal:positive for back pain and myalgias Neurological: negative Behavioral/Psych: negative Endocrine: negative Allergic/Immunologic: negative  Physical Exam: Blood pressure 110/61, pulse 80, temperature 98.3 F (36.8 C), temperature source  Oral, resp. rate 18, SpO2 100.00%. BP 119/61  Pulse 84  Temp(Src) 97.7 F (36.5 C) (Oral)  Resp 18  SpO2 97% General appearance: alert, cooperative, appears stated age and mild distress Head: Normocephalic, without obvious abnormality, atraumatic Eyes: conjunctivae/corneas clear. Fundi benign. Nose: Nares normal. Septum midline. Mucosa normal. No drainage or sinus tenderness. Throat: lips, mucosa, and tongue normal; teeth and gums normal Neck: no adenopathy, no carotid bruit, no JVD, supple, symmetrical, trachea midline and thyroid not enlarged, symmetric, no  tenderness/mass/nodules Lungs: clear to auscultation bilaterally Chest wall: no tenderness Heart: irregularly irregular rhythm and  murmur: 2/6, blowing at 2nd left intercostal [name] Abdomen: soft, non-tender; bowel sounds normal; no masses,  no organomegaly Extremities: extremities normal, atraumatic, no cyanosis or edema Back: Decreased ROM, tender to palpation Pulses: 2+ and symmetric Skin: discoloration to nail beds, toenail thickening, inflammation to right great toe Lymph nodes: Cervical, supraclavicular, and axillary nodes normal. Neurologic: Alert and oriented X 3, normal strength and tone. Normal symmetric reflexes. Normal coordination and gait  Lab results: Results for orders placed during the hospital encounter of 08/27/13 (from the past 24 hour(s))  ABO/RH     Status: None   Collection Time    08/27/13  3:39 PM      Result Value Ref Range   ABO/RH(D) AB POS    PREPARE RBC (CROSSMATCH)     Status: None   Collection Time    08/27/13  3:40 PM      Result Value Ref Range   Order Confirmation ORDER PROCESSED BY BLOOD BANK    TYPE AND SCREEN     Status: None   Collection Time    08/27/13  4:00 PM      Result Value Ref Range   ABO/RH(D) AB POS     Antibody Screen NEG     Sample Expiration 08/30/2013     Unit Number [id number]     Blood Component Type RED CELLS,LR     Unit division 00     Status of Unit ALLOCATED     Donor AG Type       Value: NEGATIVE FOR C ANTIGEN NEGATIVE FOR E ANTIGEN NEGATIVE FOR KELL ANTIGEN   Transfusion Status OK TO TRANSFUSE     Crossmatch Result Compatible    CBC WITH DIFFERENTIAL     Status: Abnormal (Preliminary result)   Collection Time    08/27/13  4:00 PM      Result Value Ref Range   WBC 9.7  4.0 - 10.5 K/uL   RBC 2.22 (*) 4.22 - 5.81 MIL/uL   Hemoglobin 7.2 (*) 13.0 - 17.0 g/dL   HCT 19.8 (*) 39.0 - 52.0 %   MCV 89.2  78.0 - 100.0 fL   MCH 32.4  26.0 - 34.0 pg   MCHC 36.4 (*) 30.0 - 36.0 g/dL   RDW 19.9 (*) 11.5 - 15.5 %    Platelets PENDING  150 - 400 K/uL   Neutrophils Relative % PENDING  43 - 77 %   Neutro Abs PENDING  1.7 - 7.7 K/uL   Band Neutrophils PENDING  0 - 10 %   Lymphocytes Relative PENDING  12 - 46 %   Lymphs Abs PENDING  0.7 - 4.0 K/uL   Monocytes Relative PENDING  3 - 12 %   Monocytes Absolute PENDING  0.1 - 1.0 K/uL   Eosinophils Relative PENDING  0 - 5 %   Eosinophils Absolute PENDING  0.0 - 0.7 K/uL   Basophils  Relative PENDING  0 - 1 %   Basophils Absolute PENDING  0.0 - 0.1 K/uL   WBC Morphology PENDING     RBC Morphology PENDING     Smear Review PENDING     nRBC PENDING  0 /100 WBC   Metamyelocytes Relative PENDING     Myelocytes PENDING     Promyelocytes Absolute PENDING     Blasts PENDING    COMPREHENSIVE METABOLIC PANEL     Status: Abnormal   Collection Time    08/27/13  4:00 PM      Result Value Ref Range   Sodium 134 (*) 137 - 147 mEq/L   Potassium 4.5  3.7 - 5.3 mEq/L   Chloride 98  96 - 112 mEq/L   CO2 20  19 - 32 mEq/L   Glucose, Bld 94  70 - 99 mg/dL   BUN 65 (*) 6 - 23 mg/dL   Creatinine, Ser 2.78 (*) 0.50 - 1.35 mg/dL   Calcium 8.7  8.4 - 10.5 mg/dL   Total Protein 7.8  6.0 - 8.3 g/dL   Albumin 4.0  3.5 - 5.2 g/dL   AST 39 (*) 0 - 37 U/L   ALT 16  0 - 53 U/L   Alkaline Phosphatase 99  39 - 117 U/L   Total Bilirubin 2.7 (*) 0.3 - 1.2 mg/dL   GFR calc non Af Amer 24 (*) >90 mL/min   GFR calc Af Amer 28 (*) >90 mL/min  RETICULOCYTES     Status: Abnormal   Collection Time    08/27/13  4:00 PM      Result Value Ref Range   Retic Ct Pct 9.7 (*) 0.4 - 3.1 %   RBC. 2.22 (*) 4.22 - 5.81 MIL/uL   Retic Count, Manual 215.3 (*) 19.0 - 186.0 K/uL    Imaging results:  No results found.  Assessment & Plan: 1. Sickle cell pain: Admit to day hospital for extended observation. Start with 0.5 mg Dilaudid IV, reaccess pain after doses. Start IVFs at [facility]. Labs: CBC with differential, Reticulocytes, Complete metabolic panel  2. Anemia: Last hemoglobin was 6.4, order type  and screen to transfuse. Goal hemoglobin is 8.  3. Cellulitis to right great toe: Start Keflex 500 mg every 12 hours for 10 days.   Plan of care outlined and  reviewed with Dr. Helane Rima M 08/27/2013, 5:16 PM

## 2013-08-28 ENCOUNTER — Telehealth (HOSPITAL_COMMUNITY): Payer: Self-pay | Admitting: *Deleted

## 2013-08-28 ENCOUNTER — Encounter (HOSPITAL_COMMUNITY): Payer: Self-pay

## 2013-08-28 ENCOUNTER — Non-Acute Institutional Stay (HOSPITAL_COMMUNITY)
Admission: AD | Admit: 2013-08-28 | Discharge: 2013-08-28 | Disposition: A | Discharge status: Home / Self Care | Payer: Medicaid Other | Source: Ambulatory Visit | Attending: Internal Medicine | Admitting: Internal Medicine

## 2013-08-28 ENCOUNTER — Encounter: Payer: Self-pay | Admitting: Internal Medicine

## 2013-08-28 ENCOUNTER — Ambulatory Visit (HOSPITAL_BASED_OUTPATIENT_CLINIC_OR_DEPARTMENT_OTHER)
Admit: 2013-08-28 | Discharge: 2013-08-28 | Disposition: A | Discharge status: Home / Self Care | Payer: Medicaid Other | Attending: Internal Medicine | Admitting: Internal Medicine

## 2013-08-28 VITALS — BP 141/66 | HR 74 | Resp 18 | Wt 147.5 lb

## 2013-08-28 DIAGNOSIS — Z79899 Other long term (current) drug therapy: Secondary | ICD-10-CM | POA: Insufficient documentation

## 2013-08-28 DIAGNOSIS — E860 Dehydration: Secondary | ICD-10-CM | POA: Insufficient documentation

## 2013-08-28 DIAGNOSIS — M79609 Pain in unspecified limb: Secondary | ICD-10-CM | POA: Diagnosis not present

## 2013-08-28 DIAGNOSIS — D5700 Hb-SS disease with crisis, unspecified: Secondary | ICD-10-CM | POA: Insufficient documentation

## 2013-08-28 DIAGNOSIS — D571 Sickle-cell disease without crisis: Secondary | ICD-10-CM

## 2013-08-28 DIAGNOSIS — N179 Acute kidney failure, unspecified: Secondary | ICD-10-CM

## 2013-08-28 DIAGNOSIS — D649 Anemia, unspecified: Secondary | ICD-10-CM | POA: Diagnosis not present

## 2013-08-28 DIAGNOSIS — M545 Low back pain, unspecified: Secondary | ICD-10-CM | POA: Diagnosis present

## 2013-08-28 DIAGNOSIS — I5022 Chronic systolic (congestive) heart failure: Secondary | ICD-10-CM

## 2013-08-28 LAB — TYPE AND SCREEN
ABO/RH(D): AB POS
Antibody Screen: NEGATIVE
Unit division: 0

## 2013-08-28 LAB — BASIC METABOLIC PANEL
BUN: 61 mg/dL — ABNORMAL HIGH (ref 6–23)
BUN: 57 mg/dL — ABNORMAL HIGH (ref 6–23)
CO2: 21 mEq/L (ref 19–32)
CO2: 19 mEq/L (ref 19–32)
Calcium: 8.9 mg/dL (ref 8.4–10.5)
Calcium: 8.6 mg/dL (ref 8.4–10.5)
Chloride: 99 mEq/L (ref 96–112)
Chloride: 102 mEq/L (ref 96–112)
Creatinine, Ser: 2.42 mg/dL — ABNORMAL HIGH (ref 0.50–1.35)
Creatinine, Ser: 2.22 mg/dL — ABNORMAL HIGH (ref 0.50–1.35)
GFR calc Af Amer: 33 mL/min — ABNORMAL LOW (ref >90)
GFR calc Af Amer: 37 mL/min — ABNORMAL LOW (ref >90)
GFR calc non Af Amer: 28 mL/min — ABNORMAL LOW (ref >90)
GFR calc non Af Amer: 32 mL/min — ABNORMAL LOW (ref >90)
Glucose, Bld: 91 mg/dL (ref 70–99)
Glucose, Bld: 99 mg/dL (ref 70–99)
Potassium: 4.3 mEq/L (ref 3.7–5.3)
Potassium: 4.4 mEq/L (ref 3.7–5.3)
Sodium: 136 mEq/L — ABNORMAL LOW (ref 137–147)
Sodium: 136 mEq/L — ABNORMAL LOW (ref 137–147)

## 2013-08-28 LAB — CBC WITH DIFFERENTIAL/PLATELET
Basophils Absolute: 0.2 10*3/uL — ABNORMAL HIGH (ref 0.0–0.1)
Basophils Relative: 2 % — ABNORMAL HIGH (ref 0–1)
Eosinophils Absolute: 0.5 10*3/uL (ref 0.0–0.7)
Eosinophils Relative: 4 % (ref 0–5)
HCT: 22.5 % — ABNORMAL LOW (ref 39.0–52.0)
Hemoglobin: 8.2 g/dL — ABNORMAL LOW (ref 13.0–17.0)
Lymphocytes Relative: 12 % (ref 12–46)
Lymphs Abs: 1.5 10*3/uL (ref 0.7–4.0)
MCH: 31.4 pg (ref 26.0–34.0)
MCHC: 36.4 g/dL — ABNORMAL HIGH (ref 30.0–36.0)
MCV: 86.2 fL (ref 78.0–100.0)
Monocytes Absolute: 2.2 10*3/uL — ABNORMAL HIGH (ref 0.1–1.0)
Monocytes Relative: 18 % — ABNORMAL HIGH (ref 3–12)
Neutro Abs: 7.7 10*3/uL (ref 1.7–7.7)
Neutrophils Relative %: 64 % (ref 43–77)
Platelets: 175 10*3/uL (ref 150–400)
RBC: 2.61 MIL/uL — ABNORMAL LOW (ref 4.22–5.81)
RDW: 19.3 % — ABNORMAL HIGH (ref 11.5–15.5)
WBC: 12.1 10*3/uL — ABNORMAL HIGH (ref 4.0–10.5)

## 2013-08-28 LAB — HEMOGLOBIN AND HEMATOCRIT, BLOOD
HCT: 19.5 % — ABNORMAL LOW (ref 39.0–52.0)
Hemoglobin: 7 g/dL — ABNORMAL LOW (ref 13.0–17.0)

## 2013-08-28 MED ORDER — NALOXONE HCL 0.4 MG/ML IJ SOLN: 0.4000 mg | INTRAMUSCULAR | Status: DC | PRN

## 2013-08-28 MED ORDER — FOLIC ACID 1 MG PO TABS: 1.0000 mg | ORAL_TABLET | Freq: Every day | ORAL | Status: DC

## 2013-08-28 MED ORDER — OXYCODONE HCL 5 MG PO TABS
5.0000 mg | ORAL_TABLET | Freq: Once | ORAL | Status: AC
  Administered 2013-08-28: 5 mg via ORAL
  Filled 2013-08-28: qty 1

## 2013-08-28 MED ORDER — ONDANSETRON HCL 4 MG/2ML IJ SOLN
4.0000 mg | Freq: Four times a day (QID) | INTRAMUSCULAR | Status: DC | PRN
Start: 2013-08-28 — End: 2013-08-28

## 2013-08-28 MED ORDER — HYDROMORPHONE HCL PF 2 MG/ML IJ SOLN
0.5000 mg | Freq: Once | INTRAMUSCULAR | Status: AC
  Administered 2013-08-28: 0.5 mg via INTRAVENOUS
  Filled 2013-08-28: qty 1

## 2013-08-28 MED ORDER — FOLIC ACID 1 MG PO TABS
1.0000 mg | ORAL_TABLET | Freq: Every day | ORAL | Status: DC
  Administered 2013-08-28: 1 mg via ORAL
  Filled 2013-08-28: qty 1

## 2013-08-28 MED ORDER — DIPHENHYDRAMINE HCL 12.5 MG/5ML PO ELIX: 12.5000 mg | ORAL_SOLUTION | Freq: Four times a day (QID) | ORAL | Status: DC | PRN

## 2013-08-28 MED ORDER — HYDROMORPHONE 0.3 MG/ML IV SOLN
INTRAVENOUS | Status: DC
  Administered 2013-08-28: 1.7 mg via INTRAVENOUS
  Administered 2013-08-28: 14:00:00 via INTRAVENOUS
  Filled 2013-08-28: qty 25

## 2013-08-28 MED ORDER — DEXTROSE-NACL 5-0.45 % IV SOLN
INTRAVENOUS | Status: DC
  Administered 2013-08-28: 11:00:00 via INTRAVENOUS

## 2013-08-28 MED ORDER — DIPHENHYDRAMINE HCL 50 MG/ML IJ SOLN: 12.5000 mg | Freq: Four times a day (QID) | INTRAMUSCULAR | Status: DC | PRN

## 2013-08-28 MED ORDER — SODIUM CHLORIDE 0.9 % IJ SOLN: 9.0000 mL | INTRAMUSCULAR | Status: DC | PRN

## 2013-08-28 NOTE — Discharge Instructions (Signed)
1. Patient to start Oxycodone 5-325 mg 0.5 tablet every 4 hours as needed for moderate to severe pain. 2. Patient to re-start Furosemide 40 mg 2 tablets (80 mg) two times daily  as previously prescribed. 3. Patient to follow-up with Cammie Sickle, FNP on Monday 08/31/2013 at 11:30   Sickle Cell Anemia, Adult Sickle cell anemia is a condition in which red blood cells have an abnormal "sickle" shape. This abnormal shape shortens the cells' life span, which results in a lower than normal concentration of red blood cells in the blood. The sickle shape also causes the cells to clump together and block free blood flow through the blood vessels. As a result, the tissues and organs of the body do not receive enough oxygen. Sickle cell anemia causes organ damage and pain and increases the risk of infection. CAUSES  Sickle cell anemia is a genetic disorder. Those who receive two copies of the gene have the condition, and those who receive one copy have the trait. RISK FACTORS The sickle cell gene is most common in people whose families originated in [place]. Other areas of the globe where sickle cell trait occurs include the [place], [place], and the [place].  SIGNS AND SYMPTOMS  Pain, especially in the extremities, back, chest, or abdomen (common). The pain may start suddenly or may develop following an illness, especially if there is dehydration. Pain can also occur due to overexertion or exposure to extreme temperature changes.  Frequent severe bacterial infections, especially certain types of pneumonia and meningitis.  Pain and swelling in the hands and feet.  Decreased activity.   Loss of appetite.   Change in behavior.  Headaches.  Seizures.  Shortness of breath or difficulty breathing.  Vision changes.  Skin ulcers. Those with the trait may not have symptoms or they may have mild symptoms.  DIAGNOSIS  Sickle cell anemia is diagnosed with  blood tests that demonstrate the genetic trait. It is often diagnosed during the newborn period, due to mandatory testing nationwide. A variety of blood tests, X-rays, CT scans, MRI scans, ultrasounds, and lung function tests may also be done to monitor the condition. TREATMENT  Sickle cell anemia may be treated with:  Medicines. You may be given pain medicines, antibiotic medicines (to treat and prevent infections) or medicines to increase the production of certain types of hemoglobin.  Fluids.  Oxygen.  Blood transfusions. HOME CARE INSTRUCTIONS   Drink enough fluid to keep your urine clear or pale yellow. Increase your fluid intake in hot weather and during exercise.  Do not smoke. Smoking lowers oxygen levels in the blood.   Only take over-the-counter or prescription medicines for pain, fever, or discomfort as directed by your health care provider.  Take antibiotics as directed by your health care provider. Make sure you finish them it even if you start to feel better.   Take supplements as directed by your health care provider.   Consider wearing a medical alert bracelet. This tells anyone caring for you in an emergency of your condition.   When traveling, keep your medical information, health care provider's names, and the medicines you take with you at all times.   If you develop a fever, do not take medicines to reduce the fever right away. This could cover up a problem that is developing. Notify your health care provider.  Keep all follow-up appointments with your health care provider. Sickle cell anemia requires regular medical care. SEEK MEDICAL CARE IF:  You have a fever. SEEK IMMEDIATE MEDICAL CARE IF:   You feel dizzy or faint.   You have new abdominal pain, especially on the left side near the stomach area.   You develop a persistent, often uncomfortable and painful penile erection (priapism). If this is not treated immediately it will lead to impotence.    You have numbness your arms or legs or you have a hard time moving them.   You have a hard time with speech.   You have a fever or persistent symptoms for more than 2 3 days.   You have a fever and your symptoms suddenly get worse.   You have signs or symptoms of infection. These include:   Chills.   Abnormal tiredness (lethargy).   Irritability.   Poor eating.   Vomiting.   You develop pain that is not helped with medicine.   You develop shortness of breath.  You have pain in your chest.   You are coughing up pus-like or bloody sputum.   You develop a stiff neck.  Your feet or hands swell or have pain.  Your abdomen appears bloated.  You develop joint pain. MAKE SURE YOU:  Understand these instructions.  Will watch your child's condition.  Will get help right away if your child is not doing well or gets worse. Document Released: 09/12/2005 Document Revised: 03/25/2013 Document Reviewed: 01/14/2013 [facility] Patient Information 2014 [place], [place].

## 2013-08-28 NOTE — Discharge Summary (Signed)
Physician Discharge Summary  Patrick BUFKIN [id number] DOB: Jul 22, 1955 DOA: 08/28/2013  PCP: MATTHEWS,MICHELLE A., MD  Admit date: 08/28/2013 Discharge date: 08/28/2013  Discharge Diagnoses:  Active Problems:   * No active hospital problems. *   Discharge Condition:Stable  Disposition:   Diet: cardiac Wt Readings from Last 3 Encounters:  08/28/13 147 lb (66.679 kg)  08/28/13 147 lb 8 oz (66.906 kg)  08/27/13 146 lb (66.225 kg)    History of present illness:  58 year old patient with a history of sickle cell disease presents with mid to lower back pain. Patient was admitted to day hospital on 08/27/2013 with right great toe pain and mid to lower back pain consistent with sickle cell pain crisis. Patient was treated with IV pain medications, transfused 1 unit of packed red blood cells, and discharged home in stable condition. Patient was given instructions to return to day hospital to recheck labs on 08/28/2013. However, patient reports that pain intensity has increased to an intensity of 5/10, constant and throbbing in nature, unrelieved by Oxcodone 2.5, last taken on 08/27/2013 around 11 pm. Patient denies fever, headache, dizziness, nausea, vomiting, diarrhea, and shortness of breath Patient admitted for extended observation.    Hospital Course:  1. Sickle cell anemia crisis: IV Dilaudid 0.5 mg X2 doses. Pain intensity remained greater than 5. Transitioned to full dose PCA. Total use  1.7 mg 4 demands and 4 deliveries. Pain intensity decreased from 5/10 to 2/10. Patient currently functional and states that he can manage at home at current pain intensity. Patient to continue  Oxycodone-acetaminophen 5-325 mg every 6 hours as needed for moderate to severe pain.  2. Anemia: Current Hgb decreased to 7.0, patient will return on 08/31/2013 for labs. To re-start Furosemide on 08/29/2013. 3. Dehydrated: Resolved, Reviewed BMP. Will repeat BMP on 08/31/2013  Discharge Exam:  Filed Vitals:    08/28/13 1448  BP: 145/83  Pulse: 85  Temp: 98.2 F (36.8 C)  Resp: 14   Filed Vitals:   08/28/13 1100 08/28/13 1428 08/28/13 1438 08/28/13 1448  BP: 146/78  138/76 145/83  Pulse: 80 80 81 85  Temp: 98.3 F (36.8 C)  98.3 F (36.8 C) 98.2 F (36.8 C)  TempSrc: Oral  Oral Oral  Resp: 18 12 14 14   Weight: 147 lb (66.679 kg)     SpO2: 100% 98% 95% 96%  General appearance: alert, cooperative, appears stated age and mild distress  Head: Normocephalic, without obvious abnormality, atraumatic  Lungs: clear to auscultation bilaterally  Chest wall: no tenderness  Heart: irregular rhythm and murmur  Abdomen: soft, non-tender; bowel sounds normal; no masses, no organomegaly  Extremities: extremities normal, atraumatic, no cyanosis or edema  Back: Decreased ROM, tender to palpation  Neurologic: Alert and oriented X 3, normal strength and tone. Normal symmetric reflexes. Normal coordination and gait   Discharge Instructions   Future Appointments Provider Department Dept Phone   08/31/2013 11:00 AM Dorena Dew, [place] [phone]   09/01/2013 12:00 PM [facility] [address] [place] [phone]   09/01/2013 12:00 PM [facility] [phone]       Medication List         aspirin EC 81 MG tablet  Take 81 mg by mouth daily.     carvedilol 25 MG tablet  Commonly known as:  COREG  Take 1 tablet (25 mg total) by mouth 2 (two) times daily with  a meal.     cephALEXin 500 MG capsule  Commonly known as:  KEFLEX  Take 1 capsule (500 mg total) by mouth 2 (two) times daily.     folic acid 1 MG tablet  Commonly known as:  FOLVITE  Take 1 tablet (1 mg total) by mouth daily.     hydrALAZINE 25 MG tablet  Commonly known as:  APRESOLINE  Take 50 mg by mouth 3 (three) times daily. 1.5 TAB(75MG  TOTAL)     isosorbide mononitrate 30 MG 24 hr tablet  Commonly known  as:  IMDUR  Take 1 tablet (30 mg total) by mouth daily.     oxyCODONE-acetaminophen 5-325 MG per tablet  Commonly known as:  ROXICET  Take 0.5 tablets by mouth every 6 (six) hours as needed for severe pain.          The results of significant diagnostics from this hospitalization (including imaging, microbiology, ancillary and laboratory) are listed below for reference.    Significant Diagnostic Studies: Dg Chest 2 View  08/19/2013   CLINICAL DATA:  Short of breath for 4 days.  CHF.  EXAM: CHEST  2 VIEW  COMPARISON:  DG CHEST 2 VIEW dated 03/31/2013  FINDINGS: Mild CHF is present. There is cardiomegaly, pulmonary vascular congestion and interstitial pulmonary edema. Narrow a be tiny effusions with blunting of the costophrenic angles on the lateral view. No focal consolidation.  IMPRESSION: Mild CHF with interstitial pulmonary edema.   Electronically Signed   By: Dereck Ligas M.D.   On: 08/19/2013 19:15    Microbiology: No results found for this or any previous visit (from the past 240 hour(s)).   Labs: Basic Metabolic Panel:  Recent Labs Lab 08/22/13 0454 08/27/13 1600 08/28/13 1110  NA 141 134* 136*  K 4.1 4.5 4.3  CL 98 98 99  CO2 30 20 21   GLUCOSE 140* 94 91  BUN 43* 65* 61*  CREATININE 2.23* 2.78* 2.42*  CALCIUM 9.1 8.7 8.9   Liver Function Tests:  Recent Labs Lab 08/27/13 1600  AST 39*  ALT 16  ALKPHOS 99  BILITOT 2.7*  PROT 7.8  ALBUMIN 4.0   No results found for this basename: LIPASE, AMYLASE,  in the last 168 hours No results found for this basename: AMMONIA,  in the last 168 hours CBC:  Recent Labs Lab 08/27/13 1600 08/28/13 1110  WBC 9.7 12.1*  NEUTROABS 5.6 7.7  HGB 7.2* 8.2*  HCT 19.8* 22.5*  MCV 89.2 86.2  PLT 157 175   Cardiac Enzymes: No results found for this basename: CKTOTAL, CKMB, CKMBINDEX, TROPONINI,  in the last 168 hours BNP: No components found with this basename: POCBNP,  CBG: No results found for this basename: GLUCAP,   in the last 168 hours Ferritin: No results found for this basename: FERRITIN,  in the last 168 hours  Time coordinating discharge: 45  Signed:  Avilyn Virtue M  08/28/2013, 4:19 PM

## 2013-08-28 NOTE — Telephone Encounter (Signed)
This RN spoke with Benancio Deeds CMA at Dr. Keturah Barre. [facility] office. Informed Shantelle that Dr. Zigmund Daniel instructed patient to hold allopurinol and lasix for last night and this morning. Dr. Zigmund Daniel has spoken with patient about deteriorated renal function. Informed Shantelle, the patient is to see Dr. Zigmund Daniel in the office after his cardiology visit and labs and hydration can be done in the [facility]ick[facility] cell clinic; and Dr. Zigmund Daniel can assess patient for admission needs. [facility] acknowledged.

## 2013-08-28 NOTE — Addendum Note (Signed)
Addended by: Liston Alba A on: 08/28/2013 02:23 PM   Modules accepted: Orders

## 2013-08-28 NOTE — Progress Notes (Signed)
Patient discharged home. VSS. Patient in no apparent distress. Discharge instruction reviewed. Patient acknowledges. Patient left day hospital with belongings ambulatory escorted by nursing staff.

## 2013-08-28 NOTE — Telephone Encounter (Signed)
Called patient to follow up on patient status and how he is feeling. No answer, Received busy signal, unable to leave message.

## 2013-08-28 NOTE — Patient Instructions (Signed)
Follow up next week.   Hold lasix today and restart tomorrow.

## 2013-08-28 NOTE — H&P (Signed)
[facility] History and Physical   Date: 08/28/2013  Patient name: Patrick [name] Medical record number: [id number] Date of birth: [birth year] Age: 58 y.o. Gender: male PCP: MATTHEWS,MICHELLE A., MD  Attending physician: Elwyn Reach, MD  Chief Complaint: Back pain  History of Present Illness: 58 year old patient with a history of sickle cell disease presents with mid to lower back pain. Patient was admitted to day hospital on 08/27/2013 with right great toe pain and mid to lower back pain consistent with sickle cell pain crisis. Patient was treated with IV pain medications, transfused 1 unit of packed red blood cells,  and discharged home in stable condition. Patient was given instructions to return to day hospital to recheck labs on 08/28/2013. However, patient reports that pain intensity has increased to an intensity of 5/10, constant and throbbing in nature,  unrelieved by Oxcodone 2.5, last taken on 08/27/2013 around 11 pm. Patient denies fever, headache, dizziness, nausea, vomiting, diarrhea, and shortness of breath Patient admitted for extended observation.    Meds: Prescriptions prior to admission  Medication Sig Dispense Refill  . aspirin EC 81 MG tablet Take 81 mg by mouth daily.      . carvedilol (COREG) 25 MG tablet Take 1 tablet (25 mg total) by mouth 2 (two) times daily with a meal.  60 tablet  6  . cephALEXin (KEFLEX) 500 MG capsule Take 1 capsule (500 mg total) by mouth 2 (two) times daily.  20 capsule  0  . hydrALAZINE (APRESOLINE) 25 MG tablet Take 50 mg by mouth 3 (three) times daily. 1.5 TAB(75MG  TOTAL)      . isosorbide mononitrate (IMDUR) 30 MG 24 hr tablet Take 1 tablet (30 mg total) by mouth daily.  30 tablet  6  . oxyCODONE-acetaminophen (ROXICET) 5-325 MG per tablet Take 0.5 tablets by mouth every 6 (six) hours as needed for severe pain.  30 tablet  0    Allergies: Review of patient's allergies indicates no known allergies. Past Medical History   Diagnosis Date  . Swelling of extremity, right   . Swelling abdomen   . Swelling of extremity, left   . CHF (congestive heart failure)   . Blood transfusion     "today is the second time I've had a blood transfusion" (11/21/2012)  . Sickle cell anemia   . Blood dyscrasia   . Exertional shortness of breath   . Legally blind    Past Surgical History  Procedure Laterality Date  . No past surgeries     Family History  Problem Relation Age of Onset  . Alcohol abuse Mother   . Liver disease Mother   . Cirrhosis Mother    History   Social History  . Marital Status: Divorced    Spouse Name: N/A    Number of Children: N/A  . Years of Education: N/A   Occupational History  . Not on file.   Social History Main Topics  . Smoking status: Current Every Day Smoker -- 0.25 packs/day for 40 years    Types: Cigarettes  . Smokeless tobacco: Never Used  . Alcohol Use: 0.0 oz/week     Comment: 11/21/2012 "used to drink a beer q now and then; last beer ~ 3 months ago"  . Drug Use: No  . Sexual Activity: Not Currently   Other Topics Concern  . Not on file   Social History Narrative  . No narrative on file    Review of Systems: Musculoskeletal:positive for  back pain and myalgias  Physical Exam: Blood pressure 145/83, pulse 85, temperature 98.2 F (36.8 C), temperature source Oral, resp. rate 14, weight 147 lb (66.679 kg), SpO2 96.00%.  General appearance: alert, cooperative, appears stated age and mild distress  Head: Normocephalic, without obvious abnormality, atraumatic  Nose: Nares normal. Septum midline. Mucosa normal. No drainage or sinus tenderness.  Throat: lips, mucosa, and tongue normal; teeth and gums normal  Neck: no adenopathy, no carotid bruit, no JVD, supple, symmetrical, trachea midline and thyroid not enlarged, symmetric, no tenderness/mass/nodules  Lungs: clear to auscultation bilaterally  Chest wall: no tenderness  Heart:  irregular rhythm and murmur Abdomen:  soft, non-tender; bowel sounds normal; no masses, no organomegaly  Extremities: extremities normal, atraumatic, no cyanosis or edema  Back: Decreased ROM, tender to palpation  Pulses: 2+ and symmetric  Lymph nodes: Cervical, supraclavicular, and axillary nodes normal.  Neurologic: Alert and oriented X 3, normal strength and tone. Normal symmetric reflexes. Normal coordination and gait     Lab results: Results for orders placed during the hospital encounter of 08/28/13 (from the past 24 hour(s))  BASIC METABOLIC PANEL     Status: Abnormal   Collection Time    08/28/13 11:10 AM      Result Value Ref Range   Sodium 136 (*) 137 - 147 mEq/L   Potassium 4.3  3.7 - 5.3 mEq/L   Chloride 99  96 - 112 mEq/L   CO2 21  19 - 32 mEq/L   Glucose, Bld 91  70 - 99 mg/dL   BUN 61 (*) 6 - 23 mg/dL   Creatinine, Ser 2.42 (*) 0.50 - 1.35 mg/dL   Calcium 8.9  8.4 - 10.5 mg/dL   GFR calc non Af Amer 28 (*) >90 mL/min   GFR calc Af Amer 33 (*) >90 mL/min  CBC WITH DIFFERENTIAL     Status: Abnormal   Collection Time    08/28/13 11:10 AM      Result Value Ref Range   WBC 12.1 (*) 4.0 - 10.5 K/uL   RBC 2.61 (*) 4.22 - 5.81 MIL/uL   Hemoglobin 8.2 (*) 13.0 - 17.0 g/dL   HCT 22.5 (*) 39.0 - 52.0 %   MCV 86.2  78.0 - 100.0 fL   MCH 31.4  26.0 - 34.0 pg   MCHC 36.4 (*) 30.0 - 36.0 g/dL   RDW 19.3 (*) 11.5 - 15.5 %   Platelets 175  150 - 400 K/uL   Neutrophils Relative % 64  43 - 77 %   Lymphocytes Relative 12  12 - 46 %   Monocytes Relative 18 (*) 3 - 12 %   Eosinophils Relative 4  0 - 5 %   Basophils Relative 2 (*) 0 - 1 %   Neutro Abs 7.7  1.7 - 7.7 K/uL   Lymphs Abs 1.5  0.7 - 4.0 K/uL   Monocytes Absolute 2.2 (*) 0.1 - 1.0 K/uL   Eosinophils Absolute 0.5  0.0 - 0.7 K/uL   Basophils Absolute 0.2 (*) 0.0 - 0.1 K/uL   RBC Morphology TARGET CELLS      Imaging results:  No results found.   Assessment & Plan:  1. Sickle cell pain crisis: Admit to day hospital for extended observation. Start IV  dilaudid 0.5 mg X 1 dose, will re-evaluate. Will transition to full dose Dilaudid PCA if pain intensity remains elevated. Give folic acid 1 mg po.  Labs: CBC with differential, BMP  2. Anemia: Hemoglobin 8.2.  Goal hemoglobin 8.0, will not transfuse at this point. Will re evaluate hemoglobin and hematocrit prior to discharge.    3. Mild dehydration: Start D 5 1/2 at 100 for 1 liter. Check BMP  Plan outlined and discussed with Dr. Zigmund Daniel.   Navy Rothschild M 08/28/2013, 3:51 PM

## 2013-08-28 NOTE — Progress Notes (Signed)
Patient ID: Patrick [name], male   DOB: Oct 19, 1955, 58 y.o.   MRN: [id number]    Weight Range    145-148 pounds  Baseline proBNP   993.1 on 01/19/12  PCP: [name] : Dr Zigmund Estefany Goebel  Opthalmologist: [name]  Guide IT Trial   HPI: [name] is a 58 y.o. African American male with a history of  sickle cell anemia, systolic HF due to NICM EF 40% 11/2012 initially diagnosed 05/2011, no CAD by cath 05/2011, CKD stage III, legally blind, and tobacco abuse.   Admitted 11/18/12 from HF clinic with voume overload. Hemoglobin 6.7 Transfused 2 UPRBC. Diuresed with IV lasix. Lisinopril stopped due to CKD. Discharge weight 143 pounds.    Admitted 02/07/13 through 02/12/13 after being treated for N/V/D, and abdominal pain. CT of abdomen- cholelithias with plans to f/u for elective cholecystectomy. Received 2UPRBC.  Hemoglobin 6.1>7.1.  Diuretics held due to dehydration. He restarted lasix 60 mg bid 02/13/13.   Admitted to [facility] 3/4 thourgh 3/715 with anemia and volume overload. Hemglobin was 5.9. Received 1UPRBC with hemoglobin increased to 6.4 Diuresed with IV lasix. Discharged on carvedilol 25 mg twice a day, lasix 80 mg twice a day, hydralazine 25 mg tid,and  imdur 30 mg daily.  D/C weight was 140 pounds.   He returns for post hospital follow up. Yesterday he was evaluated by Dr Zigmund Lyrique Hakim at the [facility]. Hemoglobin at att time was 7.2 and he was given 1UPRBS. He was instructed to hold allopurinol and lasix. Complains joint pain all over.  Complains of fatigue. Denies SOB/PND/Orthopnea. Not weighing due to visual impairment.  Following low salt diet. Drinking > 2 liters per day. [facility] with [facility] [employer]    01/17/12 ECHO 55%-60%  11/20/12 ECHO EF  40% Peak PA pressure 56  Labs        02/17/13: K+ 5.0, BUN 45, Creatinine 2.2, proBNP 10135       05/21/13 K 5.4 Creatinine 2.25 Pro BNP 3076        06/09/13 K 4.6 Creatinine 2.1 Pro BNP 10,727        07/07/13  Creatinine 2.06 K 5.0         08/19/13 Creatinine 2.5 Hemoglobin 5.9 Pro BNP 8266         08/27/13 K 4.5 Creatinine 2.78 Hemoglobin 7.2  He is not on Sprironolactone due to hyperkalemia. He is not on ace inhibitor due to renal failure.     SH: Lives with his cousin in [place]. Smokes 5 -6 cigarettes per day.   ROS: All systems negative except as listed in HPI, PMH and Problem List.  Past Medical History  Diagnosis Date  . Swelling of extremity, right   . Swelling abdomen   . Swelling of extremity, left   . CHF (congestive heart failure)   . Blood transfusion     "today is the second time I've had a blood transfusion" (11/21/2012)  . Sickle cell anemia   . Blood dyscrasia   . Exertional shortness of breath   . Legally blind     Current Outpatient Prescriptions  Medication Sig Dispense Refill  . aspirin EC 81 MG tablet Take 81 mg by mouth daily.      . carvedilol (COREG) 25 MG tablet Take 1 tablet (25 mg total) by mouth 2 (two) times daily with a meal.  60 tablet  6  . cephALEXin (KEFLEX) 500 MG capsule Take 1 capsule (500 mg  total) by mouth 2 (two) times daily.  20 capsule  0  . hydrALAZINE (APRESOLINE) 25 MG tablet Take 50 mg by mouth 3 (three) times daily. 1.5 TAB(75MG  TOTAL)      . isosorbide mononitrate (IMDUR) 30 MG 24 hr tablet Take 1 tablet (30 mg total) by mouth daily.  30 tablet  6  . oxyCODONE-acetaminophen (ROXICET) 5-325 MG per tablet Take 0.5 tablets by mouth every 6 (six) hours as needed for severe pain.  30 tablet  0   No current facility-administered medications for this encounter.    Filed Vitals:   08/28/13 0944  BP: 141/66  Pulse: 74  Resp: 18  Weight: 147 lb 8 oz (66.906 kg)  SpO2: 97%    PHYSICAL EXAM: General:  Chronically ill appearing.  No resp difficulty  HEENT: normal except for mild scleral icterus Neck: supple. JVP ~8   +prominent CV waves. Carotids 2+ bilaterally; no bruits. No lymphadenopathy or thryomegaly appreciated. Cor: PMI normal  Regular rate & rhythm. No rubs, gallops 2/6 TR and 2/6 MR Lungs: clear    Abdomen: soft, nontender, + distended. No hepatosplenomegaly. No bruits or masses. Good bowel sounds. Extremities: no cyanosis, rash,  edema ,  + clubbing. Neuro: alert & orientedx3, cranial nerves grossly intact. Moves all 4 extremities w/o difficulty. Affect pleasant.   ASSESSMENT & PLAN: 1. Chronic Systolic Heart Failure -NICM  Cath 05/2011 ECHO 11/20/12 EF 40% (previous 55% 01/2012). He is legally blind and he is unable to weigh because he cant see the numbers on the scale. - NYHA II symptoms. Volume status mildly elevated but lasix has been on hold due to elevated creatinine. Would like to keep weight 145-150 pounds.  -On goal coreg 25 mg twice a day.   - Continue hydralazine to 25 mg tid. Continue Imdur 30 mg daily. - He is not on Ace/Arb/spiro due to hyperkalemia/ CKD - Reinforced daily weights, low salt food choices, and limiting fluid intake to , 2 liters perday  Lab work today per Dr Zigmund Frayda Egley.  2. HTN- Stable. Continue hydralazine 25 mg tid. Continue current dose of carvedilol, and Imdur.   3 Sickle Cell-  follow up with Dr Zigmund Jamie Hafford today for unit of PRBCs and hydration.  4. Current smoker- declines smoking cessation.  Encouraged to stop smoking 5. Legally Blind- Followed by [name]. [facility] following. Hopefully can get "talking scale" for daily weights. [name] to fax order to [facility].  6. Fatigue  Dr Haroldine Laws contacted Dr Rodman Key. Plan to hold lasix today and restart tomorrow.   [name],[name] 9:53 AM  Patient seen and examined with [name], NP. We discussed all aspects of the encounter. I agree with the assessment and plan as stated above.   He looks dry. Renal function is worse and he is anemic. I have d/w Dr. Zigmund Jauan Wohl and he will go to [facility] today for transfusion and hydration. Hold diuretics for now. Goal weight probably closer to 148-150.  Reinforced need for daily weights and reviewed use of sliding scale diuretics.  Khalel Alms,MD 10:43 PM   .

## 2013-08-30 NOTE — H&P (Signed)
Patient seen and examined. Agree with notes above by NP. Continue current treatment.

## 2013-08-31 ENCOUNTER — Ambulatory Visit (HOSPITAL_COMMUNITY)
Admission: AD | Admit: 2013-08-31 | Discharge: 2013-08-31 | Disposition: A | Discharge status: Home / Self Care | Payer: Medicaid Other | Source: Ambulatory Visit | Attending: Internal Medicine | Admitting: Internal Medicine

## 2013-08-31 ENCOUNTER — Ambulatory Visit (HOSPITAL_COMMUNITY)
Admission: RE | Admit: 2013-08-31 | Discharge: 2013-08-31 | Disposition: A | Discharge status: Home / Self Care | Payer: Medicaid Other | Source: Ambulatory Visit | Attending: Family Medicine | Admitting: Family Medicine

## 2013-08-31 ENCOUNTER — Ambulatory Visit (INDEPENDENT_AMBULATORY_CARE_PROVIDER_SITE_OTHER): Payer: Medicaid Other | Admitting: Family Medicine

## 2013-08-31 ENCOUNTER — Telehealth: Payer: Self-pay

## 2013-08-31 VITALS — BP 124/79 | HR 92 | Temp 97.9°F | Resp 18 | Ht 70.0 in | Wt 146.0 lb

## 2013-08-31 DIAGNOSIS — M79671 Pain in right foot: Secondary | ICD-10-CM

## 2013-08-31 DIAGNOSIS — B351 Tinea unguium: Secondary | ICD-10-CM

## 2013-08-31 DIAGNOSIS — R3911 Hesitancy of micturition: Secondary | ICD-10-CM

## 2013-08-31 DIAGNOSIS — M79609 Pain in unspecified limb: Secondary | ICD-10-CM

## 2013-08-31 DIAGNOSIS — R809 Proteinuria, unspecified: Secondary | ICD-10-CM

## 2013-08-31 DIAGNOSIS — D571 Sickle-cell disease without crisis: Secondary | ICD-10-CM | POA: Insufficient documentation

## 2013-08-31 DIAGNOSIS — Z720 Tobacco use: Secondary | ICD-10-CM

## 2013-08-31 DIAGNOSIS — F17200 Nicotine dependence, unspecified, uncomplicated: Secondary | ICD-10-CM

## 2013-08-31 DIAGNOSIS — L089 Local infection of the skin and subcutaneous tissue, unspecified: Secondary | ICD-10-CM

## 2013-08-31 LAB — BASIC METABOLIC PANEL
BUN: 58 mg/dL — ABNORMAL HIGH (ref 6–23)
CO2: 19 mEq/L (ref 19–32)
Calcium: 9 mg/dL (ref 8.4–10.5)
Chloride: 97 mEq/L (ref 96–112)
Creatinine, Ser: 2.22 mg/dL — ABNORMAL HIGH (ref 0.50–1.35)
GFR calc Af Amer: 37 mL/min — ABNORMAL LOW (ref >90)
GFR calc non Af Amer: 32 mL/min — ABNORMAL LOW (ref >90)
Glucose, Bld: 114 mg/dL — ABNORMAL HIGH (ref 70–99)
Potassium: 4.5 mEq/L (ref 3.7–5.3)
Sodium: 133 mEq/L — ABNORMAL LOW (ref 137–147)

## 2013-08-31 LAB — CBC WITH DIFFERENTIAL/PLATELET
Basophils Absolute: 0.1 10*3/uL (ref 0.0–0.1)
Basophils Relative: 1 % (ref 0–1)
Eosinophils Absolute: 0.3 10*3/uL (ref 0.0–0.7)
Eosinophils Relative: 3 % (ref 0–5)
HCT: 20.2 % — ABNORMAL LOW (ref 39.0–52.0)
Hemoglobin: 7.3 g/dL — ABNORMAL LOW (ref 13.0–17.0)
Lymphocytes Relative: 9 % — ABNORMAL LOW (ref 12–46)
Lymphs Abs: 0.9 10*3/uL (ref 0.7–4.0)
MCH: 31.5 pg (ref 26.0–34.0)
MCHC: 36.1 g/dL — ABNORMAL HIGH (ref 30.0–36.0)
MCV: 87.1 fL (ref 78.0–100.0)
Monocytes Absolute: 2.2 10*3/uL — ABNORMAL HIGH (ref 0.1–1.0)
Monocytes Relative: 22 % — ABNORMAL HIGH (ref 3–12)
Neutro Abs: 6.4 10*3/uL (ref 1.7–7.7)
Neutrophils Relative %: 65 % (ref 43–77)
Platelets: 201 10*3/uL (ref 150–400)
RBC: 2.32 MIL/uL — ABNORMAL LOW (ref 4.22–5.81)
RDW: 18.5 % — ABNORMAL HIGH (ref 11.5–15.5)
WBC: 9.9 10*3/uL (ref 4.0–10.5)

## 2013-08-31 LAB — PROTEIN / CREATININE RATIO, URINE
Creatinine, Urine: 61.29 mg/dL
Protein Creatinine Ratio: 2.87 — ABNORMAL HIGH (ref 0.00–0.15)
Total Protein, Urine: 175.6 mg/dL

## 2013-08-31 NOTE — Progress Notes (Signed)
Subjective:    Patient ID: Patrick [name], male    DOB: March 23, 1956, 58 y.o.   MRN: [id number]  HPI  Pt presents today for follow up of sickle cell pain crisis and dehydration. Patient was recently admitted to sickle cell day hospital for extended observation on 08/27/2013. Patient was transfused with 1 unit of packed red blood cells. Hemoglobin prior to discharge was 8.2. Patient returned to day hospital on 08/28/2013 for pain and dehydration. After receiving fluids during extended observation, hemoglobin decreased to 7.0, goal hemoglobin is 8.0.   Patient reports that back pain has decreased, yet he continues to have pain in the right foot, primarily right great toe that are not characteristic of sickle cell disease. Patient states that he is consistently taking Keflex 500 mg as prescribed. Right foot pain is described as 2/10 pain intensity that is intermittent and throbbing in nature. Pain is minimally relieved by Oxycodone-acetaminophen last taken on 08/30/2013.  Also, patient reports difficulty starting a urine stream. Maintains that urinary hesitancy started on 08/29/2013. Patient denies dysuria, urinary urgency, or frequency.   He denies any SOB, CP, dizziness, and, N/V/D.      Review of Systems  Constitutional: Positive for appetite change and fatigue.  HENT: Negative for congestion and dental problem.   Eyes: Negative.   Respiratory: Negative.   Cardiovascular: Negative.   Gastrointestinal: Negative.   Genitourinary: Positive for dysuria and difficulty urinating. Negative for urgency and frequency.  Neurological: Negative for dizziness, light-headedness and headaches.  Hematological: Negative.   Psychiatric/Behavioral: The patient is not nervous/anxious.        Objective:   Physical Exam  Constitutional: He appears well-developed and well-nourished.  HENT:  Head: Normocephalic and atraumatic.  Right Ear: Hearing, tympanic membrane, external ear and ear canal normal.    Left Ear: Hearing, tympanic membrane, external ear and ear canal normal.  Nose: Nose normal.  Mouth/Throat: Oropharynx is clear and moist and mucous membranes are normal.  Eyes: Conjunctivae and lids are normal. Lids are everted and swept, no foreign bodies found.  Neck: Normal range of motion. Neck supple.  Cardiovascular: Normal pulses.  An irregular rhythm present.  Murmur heard. Pulmonary/Chest: Effort normal.  Abdominal: Soft. Bowel sounds are normal.  Musculoskeletal:       Right ankle: He exhibits swelling. Tenderness.       Feet:  Skin: Skin is warm, dry and intact. No cyanosis. Nails show no clubbing.  Psychiatric: He has a normal mood and affect. His speech is normal and behavior is normal. Judgment and thought content normal. Cognition and memory are normal.           Assessment & Plan:  1. Sickle cell anemia: Patient reports that pain as decreased since extended observation at the sickle cell day hospital on 08/28/2013. Patient was transfused 1 unit of packed red blood cells in the day hospital for a hemaglobin of 6.4. Initially hemoglobin increased to 8.2.  Hemoglobin decreased to 7.0 on 08/28/2013 post hydration. Started folic acid daily on [id number]. Discussed the importance of taking folic acid daily. Patient reports that pain is controlled on Oxycodone 5-325 mg every 6 hours as needed. Will recheck CBC  2. Chronic Kidney disease (Stage III): Patient's BUN/Creatinine improved post hydration. Will recheck BMP   3. Oncomycosis- Increase toe nail thickening and foot pain noted. Refer to Reader for evaluation.   4. Right foot cellulitis : Send for x-ray of right foot. Continue Keflex 500 mg twice daily until completion.  Elevated lower extremities to heart level while at rest.    5. Urinary hesitancy/Protenuria: Patient maintains that urinary hesitancy started on 08/29/2013. Reports that he had difficulty starting a urine stream. Urinalysis reviewed, will collect  urine for protein/creatinine ratio.   Referred to nephrologist for protenuria.   Preventative Health: Patient has never had a colonoscopy, will refer for screening colonoscopy RTC: Patient will return to clinic in 1 week for a complete physical examination  50 % of visit spent counseling about sickle cell disease

## 2013-08-31 NOTE — Telephone Encounter (Signed)
Pt has Appointment to see a Dr. Amalia Hailey on 09/23/2013@9 :30 a.m. @ [place]. Per request of  NP C.Hollis[facility] will be in contact with Pt. to alert him of NP Appointment.

## 2013-08-31 NOTE — Progress Notes (Signed)
[facility]  Procedure Note  Patrick [name] [id number] DOB: [birth year] DOA: 08/31/2013   PCP: MATTHEWS,MICHELLE A., MD   Associated Diagnosis: Sickle Cell Anemia, Proteinuria  Procedure Note: Lab blood draw done with no complications, Urine collected.    Condition During Procedure:Patient tolerated well   Condition at Discharge: Patient tolerated well, no complaints, Left day hospital with belongings ambulatory via self.    Wendie Simmer, RN  Sickle [facility]

## 2013-09-01 ENCOUNTER — Ambulatory Visit (HOSPITAL_COMMUNITY)
Admission: RE | Admit: 2013-09-01 | Discharge: 2013-09-01 | Disposition: A | Discharge status: Home / Self Care | Payer: Medicaid Other | Source: Ambulatory Visit | Attending: Internal Medicine | Admitting: Internal Medicine

## 2013-09-01 ENCOUNTER — Other Ambulatory Visit: Payer: Self-pay | Admitting: Internal Medicine

## 2013-09-01 ENCOUNTER — Other Ambulatory Visit (HOSPITAL_COMMUNITY): Payer: Self-pay | Admitting: Family Medicine

## 2013-09-01 VITALS — BP 108/58 | HR 95 | Wt 144.5 lb

## 2013-09-01 DIAGNOSIS — Z720 Tobacco use: Secondary | ICD-10-CM

## 2013-09-01 DIAGNOSIS — N183 Chronic kidney disease, stage 3 unspecified: Secondary | ICD-10-CM | POA: Diagnosis not present

## 2013-09-01 DIAGNOSIS — I5032 Chronic diastolic (congestive) heart failure: Secondary | ICD-10-CM

## 2013-09-01 DIAGNOSIS — F17200 Nicotine dependence, unspecified, uncomplicated: Secondary | ICD-10-CM

## 2013-09-01 DIAGNOSIS — D571 Sickle-cell disease without crisis: Secondary | ICD-10-CM | POA: Diagnosis not present

## 2013-09-01 NOTE — Patient Instructions (Signed)
We will contact you in 2 months to schedule your next appointment.  

## 2013-09-01 NOTE — H&P (Signed)
Patient seen and examined. Agree with above treatment plan.

## 2013-09-01 NOTE — Progress Notes (Signed)
58 year old male with a history of sickle cell disease was seen in office on 08/31/2013. Reviewed laboratory values, hemoglobin was 7.3, goal is to transfuse to 8. Patient to return to office on 09/02/2013 for a type and cross. Will transfuse 1 unit and recheck hemoglobin and hematocrit post transfusion.  Plan outlined and reviewed with Dr. Zigmund Daniel.

## 2013-09-01 NOTE — Discharge Summary (Signed)
Patient seen and examined agree with above.

## 2013-09-02 ENCOUNTER — Non-Acute Institutional Stay (HOSPITAL_COMMUNITY)
Admission: AD | Admit: 2013-09-02 | Discharge: 2013-09-02 | Disposition: A | Discharge status: Home / Self Care | Payer: Medicaid Other | Attending: Internal Medicine | Admitting: Internal Medicine

## 2013-09-02 ENCOUNTER — Encounter: Payer: Self-pay | Admitting: Family Medicine

## 2013-09-02 ENCOUNTER — Encounter (HOSPITAL_COMMUNITY): Payer: Self-pay

## 2013-09-02 DIAGNOSIS — D5700 Hb-SS disease with crisis, unspecified: Secondary | ICD-10-CM | POA: Insufficient documentation

## 2013-09-02 LAB — BASIC METABOLIC PANEL
BUN: 53 mg/dL — ABNORMAL HIGH (ref 6–23)
CO2: 19 mEq/L (ref 19–32)
Calcium: 9 mg/dL (ref 8.4–10.5)
Chloride: 99 mEq/L (ref 96–112)
Creatinine, Ser: 2.35 mg/dL — ABNORMAL HIGH (ref 0.50–1.35)
GFR calc Af Amer: 34 mL/min — ABNORMAL LOW (ref >90)
GFR calc non Af Amer: 29 mL/min — ABNORMAL LOW (ref >90)
Glucose, Bld: 85 mg/dL (ref 70–99)
Potassium: 4.1 mEq/L (ref 3.7–5.3)
Sodium: 136 mEq/L — ABNORMAL LOW (ref 137–147)

## 2013-09-02 LAB — HEMOGLOBIN AND HEMATOCRIT, BLOOD
HCT: 19.6 % — ABNORMAL LOW (ref 39.0–52.0)
Hemoglobin: 7 g/dL — ABNORMAL LOW (ref 13.0–17.0)

## 2013-09-02 LAB — PREPARE RBC (CROSSMATCH)

## 2013-09-02 NOTE — Addendum Note (Signed)
Encounter addended by: Larey Dresser, MD on: 09/02/2013 12:18 AM<BR>     Documentation filed: Charges VN, Notes Section

## 2013-09-02 NOTE — Procedures (Signed)
[facility]  Procedure Note  Patrick [name] [id number] DOB: September 23, 1955 DOA: 09/02/2013   PCP: MATTHEWS,MICHELLE A., MD   Associated Diagnosis: Sickle Cell Anemia  Procedure Note: Patient received 1 Unit PRBC with no complications. Post transfusion lab blood draw completed.     Condition During Procedure: Patient tolerated well, no Reaction.   Condition at Discharge: Patient discharged with no complaints. Discharge summary reviewed. Patient left day hospital ambulatory with belongings, escorted by significant other.      Wendie Simmer, RN  Sickle [facility]

## 2013-09-02 NOTE — Discharge Instructions (Signed)
Sickle Cell Anemia, Adult °Sickle cell anemia is a condition in which red blood cells have an abnormal "sickle" shape. This abnormal shape shortens the cells' life span, which results in a lower than normal concentration of red blood cells in the blood. The sickle shape also causes the cells to clump together and block free blood flow through the blood vessels. As a result, the tissues and organs of the body do not receive enough oxygen. Sickle cell anemia causes organ damage and pain and increases the risk of infection. °CAUSES  °Sickle cell anemia is a genetic disorder. Those who receive two copies of the gene have the condition, and those who receive one copy have the trait. °RISK FACTORS °The sickle cell gene is most common in people whose families originated in [place]. Other areas of the globe where sickle cell trait occurs include the Mediterranean, [place] and [place], the [place], and the [place].  °SIGNS AND SYMPTOMS °· Pain, especially in the extremities, back, chest, or abdomen (common). The pain may start suddenly or may develop following an illness, especially if there is dehydration. Pain can also occur due to overexertion or exposure to extreme temperature changes. °· Frequent severe bacterial infections, especially certain types of pneumonia and meningitis. °· Pain and swelling in the hands and feet. °· Decreased activity.   °· Loss of appetite.   °· Change in behavior. °· Headaches. °· Seizures. °· Shortness of breath or difficulty breathing. °· Vision changes. °· Skin ulcers. °Those with the trait may not have symptoms or they may have mild symptoms.  °DIAGNOSIS  °Sickle cell anemia is diagnosed with blood tests that demonstrate the genetic trait. It is often diagnosed during the newborn period, due to mandatory testing nationwide. A variety of blood tests, X-rays, CT scans, MRI scans, ultrasounds, and lung function tests may also be done to monitor the condition. °TREATMENT  °Sickle  cell anemia may be treated with: °· Medicines. You may be given pain medicines, antibiotic medicines (to treat and prevent infections) or medicines to increase the production of certain types of hemoglobin. °· Fluids. °· Oxygen. °· Blood transfusions. °HOME CARE INSTRUCTIONS  °· Drink enough fluid to keep your urine clear or pale yellow. Increase your fluid intake in hot weather and during exercise. °· Do not smoke. Smoking lowers oxygen levels in the blood.   °· Only take over-the-counter or prescription medicines for pain, fever, or discomfort as directed by your health care provider. °· Take antibiotics as directed by your health care provider. Make sure you finish them it even if you start to feel better.   °· Take supplements as directed by your health care provider.   °· Consider wearing a medical alert bracelet. This tells anyone caring for you in an emergency of your condition.   °· When traveling, keep your medical information, health care provider's names, and the medicines you take with you at all times.   °· If you develop a fever, do not take medicines to reduce the fever right away. This could cover up a problem that is developing. Notify your health care provider. °· Keep all follow-up appointments with your health care provider. Sickle cell anemia requires regular medical care. °SEEK MEDICAL CARE IF: ° You have a fever. °SEEK IMMEDIATE MEDICAL CARE IF:  °· You feel dizzy or faint.   °· You have new abdominal pain, especially on the left side near the stomach area.   °· You develop a persistent, often uncomfortable and painful penile erection (priapism). If this is not treated immediately it   will lead to impotence.   °· You have numbness your arms or legs or you have a hard time moving them.   °· You have a hard time with speech.   °· You have a fever or persistent symptoms for more than 2 3 days.   °· You have a fever and your symptoms suddenly get worse.   °· You have signs or symptoms of infection.  These include:   °· Chills.   °· Abnormal tiredness (lethargy).   °· Irritability.   °· Poor eating.   °· Vomiting.   °· You develop pain that is not helped with medicine.   °· You develop shortness of breath. °· You have pain in your chest.   °· You are coughing up pus-like or bloody sputum.   °· You develop a stiff neck. °· Your feet or hands swell or have pain. °· Your abdomen appears bloated. °· You develop joint pain. °MAKE SURE YOU: °· Understand these instructions. °· Will watch your child's condition. °· Will get help right away if your child is not doing well or gets worse. °Document Released: 09/12/2005 Document Revised: 03/25/2013 Document Reviewed: 01/14/2013 °Exit[employer]® Patient Information ©2014 [employer]. ° °

## 2013-09-02 NOTE — Progress Notes (Addendum)
Patient ID: Patrick [name], male   DOB: 1955-08-07, 58 y.o.   MRN: [id number]  Weight Range    145-148 pounds  Baseline proBNP   993.1 on 01/19/12  PCP: [name] : Dr Zigmund Daniel  Opthalmologist: [name]  Guide IT Trial   HPI: Patrick [name] is a 58 y.o. African American male with a history of sickle cell anemia, systolic HF due to NICM EF 40% 11/2012 initially diagnosed 05/2011, no CAD by cath 05/2011, CKD stage III, legally blind, and tobacco abuse.   Admitted 11/18/12 from HF clinic with voume overload. Hemoglobin 6.7 Transfused 2 UPRBC. Diuresed with IV lasix. Lisinopril stopped due to CKD. Discharge weight 143 pounds.    Admitted 02/07/13 through 02/12/13 after being treated for N/V/D, and abdominal pain. CT of abdomen- cholelithias with plans to f/u for elective cholecystectomy. Received 2UPRBC.  Hemoglobin 6.1>7.1.  Diuretics held due to dehydration. He restarted lasix 60 mg bid 02/13/13.   Admitted to [facility] 3/4 thourgh 3/715 with anemia and volume overload. Hemglobin was 5.9. Received 1UPRBC with hemoglobin increased to 6.4 Diuresed with IV lasix. Discharged on carvedilol 25 mg twice a day, lasix 80 mg twice a day, hydralazine 25 mg tid,and  imdur 30 mg daily.  D/C weight was 140 pounds.   He returns for follow up. After last appointment, he was admitted with a pain crisis (earlier this month). Main pain right now is in his right foot.  Complains of fatigue. Denies SOB/PND/Orthopnea. No chest pain.  He is back on Lasix 40 mg daily.    01/17/12 ECHO 55%-60%  11/20/12 ECHO EF  40% Peak PA pressure 56  Labs        02/17/13: K+ 5.0, BUN 45, Creatinine 2.2, proBNP 10135       05/21/13 K 5.4 Creatinine 2.25 Pro BNP 3076        06/09/13 K 4.6 Creatinine 2.1 Pro BNP 10,727        07/07/13 Creatinine 2.06 K 5.0         08/19/13 Creatinine 2.5 Hemoglobin 5.9 Pro BNP 8266         08/27/13 K 4.5 Creatinine 2.78 Hemoglobin 7.2  He is not on Sprironolactone due to hyperkalemia. He is not on ace  inhibitor due to renal failure.     SH: Lives with his cousin in [place]. Smokes 5 -6 cigarettes per day.   ROS: All systems negative except as listed in HPI, PMH and Problem List.  Past Medical History  Diagnosis Date  . Swelling of extremity, right   . Swelling abdomen   . Swelling of extremity, left   . CHF (congestive heart failure)   . Blood transfusion     "today is the second time I've had a blood transfusion" (11/21/2012)  . Sickle cell anemia   . Blood dyscrasia   . Exertional shortness of breath   . Legally blind     Current Outpatient Prescriptions  Medication Sig Dispense Refill  . aspirin EC 81 MG tablet Take 81 mg by mouth daily.      . carvedilol (COREG) 25 MG tablet Take 1 tablet (25 mg total) by mouth 2 (two) times daily with a meal.  60 tablet  6  . cephALEXin (KEFLEX) 500 MG capsule Take 1 capsule (500 mg total) by mouth 2 (two) times daily.  20 capsule  0  . folic acid (FOLVITE) 1 MG tablet Take 1 tablet (1 mg total) by mouth daily.  30 tablet  11  . hydrALAZINE (APRESOLINE) 25 MG tablet Take 25 mg by mouth 3 (three) times daily.       . isosorbide mononitrate (IMDUR) 30 MG 24 hr tablet Take 1 tablet (30 mg total) by mouth daily.  30 tablet  6  . oxyCODONE-acetaminophen (ROXICET) 5-325 MG per tablet Take 0.5 tablets by mouth every 6 (six) hours as needed for severe pain.  30 tablet  0   No current facility-administered medications for this encounter.    Filed Vitals:   09/01/13 1155  BP: 108/58  Pulse: 95  Weight: 144 lb 8 oz (65.545 kg)  SpO2: 97%    PHYSICAL EXAM: General:  Chronically ill appearing.  No resp difficulty  HEENT: normal except for mild scleral icterus Neck: supple. JVP 7. Carotids 2+ bilaterally; no bruits. No lymphadenopathy or thryomegaly appreciated. Cor: PMI normal Regular rate & rhythm. No rubs, gallops 2/6 TR and 2/6 MR Lungs: clear    Abdomen: soft, nontender, + distended. No hepatosplenomegaly. No bruits or masses. Good  bowel sounds. Extremities: no cyanosis, rash,  edema ,  + clubbing. Neuro: alert & orientedx3, cranial nerves grossly intact. Moves all 4 extremities w/o difficulty. Affect pleasant.   ASSESSMENT & PLAN: 1. Chronic Systolic Heart Failure:  NICM. Cath 05/2011 without significant coronary disease.  ECHO 11/20/12 EF 40% (previous 55% 01/2012).  NYHA II symptoms. He is back on Lasix 40 mg daily and does not appear volume overloaded.   - Continue current Lasix dose. Will need to be careful in the setting of sickle cell to make sure he does not get too dry.   - On goal coreg 25 mg twice a day.   - Continue hydralazine to 25 mg tid. Continue Imdur 30 mg daily. - He is not on Ace/Arb/spiro due to hyperkalemia/ CKD - Reinforced daily weights, low salt food choices, and limiting fluid intake to , 2 liters perday 2. HTN: BP has been well-controlled.  3  Sickle Cell: Follows closely in sickle cell  4. Current smoker:  Declines smoking cessation.  Encouraged to stop smoking 5. CKD: Follow creatinine closely.  Most recent BMET with stable.  Needs a nephrologist, I will refer today.   Loralie Champagne  09/02/2013   .

## 2013-09-03 LAB — TYPE AND SCREEN
ABO/RH(D): AB POS
Antibody Screen: NEGATIVE
Unit division: 0
Unit division: 0

## 2013-09-04 ENCOUNTER — Encounter (HOSPITAL_COMMUNITY): Payer: Self-pay

## 2013-09-07 ENCOUNTER — Encounter: Payer: Self-pay | Admitting: Family Medicine

## 2013-09-07 ENCOUNTER — Ambulatory Visit (INDEPENDENT_AMBULATORY_CARE_PROVIDER_SITE_OTHER): Payer: Medicaid Other | Admitting: Family Medicine

## 2013-09-07 VITALS — BP 119/68 | HR 87 | Temp 98.2°F | Resp 20 | Ht 70.0 in | Wt 146.0 lb

## 2013-09-07 DIAGNOSIS — N183 Chronic kidney disease, stage 3 unspecified: Secondary | ICD-10-CM

## 2013-09-07 DIAGNOSIS — I10 Essential (primary) hypertension: Secondary | ICD-10-CM

## 2013-09-07 DIAGNOSIS — L089 Local infection of the skin and subcutaneous tissue, unspecified: Secondary | ICD-10-CM

## 2013-09-07 DIAGNOSIS — L0390 Cellulitis, unspecified: Secondary | ICD-10-CM

## 2013-09-07 DIAGNOSIS — L0291 Cutaneous abscess, unspecified: Secondary | ICD-10-CM

## 2013-09-07 DIAGNOSIS — R3911 Hesitancy of micturition: Secondary | ICD-10-CM

## 2013-09-07 DIAGNOSIS — D571 Sickle-cell disease without crisis: Secondary | ICD-10-CM

## 2013-09-07 DIAGNOSIS — B351 Tinea unguium: Secondary | ICD-10-CM

## 2013-09-07 LAB — BASIC METABOLIC PANEL WITH GFR
BUN: 44 mg/dL — ABNORMAL HIGH (ref 6–23)
CO2: 19 mEq/L (ref 19–32)
Calcium: 8.3 mg/dL — ABNORMAL LOW (ref 8.4–10.5)
Chloride: 105 mEq/L (ref 96–112)
Creat: 2.04 mg/dL — ABNORMAL HIGH (ref 0.50–1.35)
GFR, Est African American: 41 mL/min — ABNORMAL LOW
GFR, Est Non African American: 36 mL/min — ABNORMAL LOW
Glucose, Bld: 89 mg/dL (ref 70–99)
Potassium: 4.4 mEq/L (ref 3.5–5.3)
Sodium: 134 mEq/L — ABNORMAL LOW (ref 135–145)

## 2013-09-07 NOTE — Progress Notes (Signed)
   Subjective:    Patient ID: Patrick [name], male    DOB: [birth year], 58 y.o.   MRN: [id number]  HPI Patient follow up post blood transfusion on 09/02/2013. Patient was transfused 1 unit of packed red blood cells in the day hospital.  Maintains that he has more energy than in previous weeks. Post transfusion hemoglobin on 3/18 was 7.0, goal hemoglobin is 8.0.   Also, patient reports that he continues to have right foot pain. Pain is described as 2/10 pain intensity that is intermittent, non-radiating, and throbbing in nature. Reports that pain has improved with current medication regimen and improves after rest. Pain is relieved by oxycodone-acetaminophen 5-325 mg last taken on 09/05/2013 with maximum relief.   He denies any fatigue, SOB, CP, dizziness, and, N/V/D.   Review of Systems  Constitutional: Negative.   HENT: Negative.   Eyes: Positive for visual disturbance (legally blind).  Respiratory: Negative.   Cardiovascular: Negative.   Genitourinary: Positive for difficulty urinating (urinary hesitency improved).  Musculoskeletal: Positive for joint swelling (right foot and right knee occasionally) and myalgias.  Skin: Negative.   Neurological: Negative.   Hematological: Negative.   Psychiatric/Behavioral: Negative.        Objective:   Physical Exam  Constitutional: He is oriented to person, place, and time. He appears well-developed and well-nourished.  HENT:  Head: Normocephalic and atraumatic.  Eyes: Pupils are equal, round, and reactive to light.  Neck: Normal range of motion. Neck supple.  Cardiovascular: Normal rate and regular rhythm.   Pulmonary/Chest: Effort normal and breath sounds normal.  Abdominal: Soft. Bowel sounds are normal.  Musculoskeletal:       Right knee: He exhibits decreased range of motion. He exhibits no swelling, no ecchymosis and no erythema. No tenderness found.       Right ankle: He exhibits normal range of motion and no swelling. No  tenderness.  Neurological: He is alert and oriented to person, place, and time.  Skin: Skin is warm and dry. Nails show clubbing.  Toe nails gray and dull in coloration, brittle, and thickened on inspection  Psychiatric: He has a normal mood and affect.          Assessment & Plan:  1. Sickle cell anemia: Patient was transfused 1 unit of packed red blood cells in the day hospital on 09/02/2013 for a hemaglobin of 7.3. Post transfusion hemoglobin was 7.0. Patient states that he feels better than he has in weeks. His fatigue has subsided.  Patient reports that he consistently takes folic acid daily. Will re-check CBC.  Patient reports that pain is controlled on Oxycodone 5-325 mg every 6 hours as needed. Will recheck   2. Chronic Kidney disease (Stage III): Patient's BUN/Creatinine improved post hydration. Will recheck BMP   3. Oncomycosis- Increase toe nail thickening and foot pain noted. Awaiting appointment at [facility]  4. Right foot pain/cellulitis : Discussed  x-ray of right foot. Patient reports that he will take last dose of Keflex 500 mg on tonight.  Continue to elevate lower extremities to heart level while at rest. Recommend placing warm, moist compresses to right foot as needed.   5. Urinary hesitancy/Protenuria: Patient maintains that urinary hesitancy has improved. Reports that he is currently not having difficulty starting a urine stream.  Reviewed protein/creatinine ratio. Referred to nephrologist previously for protenuria.   6. HTN: controlled on current medication regimen   Labs: CBC/CMP RTC: 09/21/2013 for CPE with Dr. Zigmund Daniel

## 2013-09-08 LAB — CBC WITH DIFFERENTIAL/PLATELET
Basophils Absolute: 0.2 10*3/uL — ABNORMAL HIGH (ref 0.0–0.1)
Basophils Relative: 2 % — ABNORMAL HIGH (ref 0–1)
Eosinophils Absolute: 0.4 10*3/uL (ref 0.0–0.7)
Eosinophils Relative: 5 % (ref 0–5)
HCT: 21 % — ABNORMAL LOW (ref 39.0–52.0)
Hemoglobin: 7.2 g/dL — ABNORMAL LOW (ref 13.0–17.0)
Lymphocytes Relative: 17 % (ref 12–46)
Lymphs Abs: 1.3 10*3/uL (ref 0.7–4.0)
MCH: 30 pg (ref 26.0–34.0)
MCHC: 34.3 g/dL (ref 30.0–36.0)
MCV: 87.5 fL (ref 78.0–100.0)
Monocytes Absolute: 1.2 10*3/uL — ABNORMAL HIGH (ref 0.1–1.0)
Monocytes Relative: 15 % — ABNORMAL HIGH (ref 3–12)
Neutro Abs: 4.8 10*3/uL (ref 1.7–7.7)
Neutrophils Relative %: 61 % (ref 43–77)
Platelets: 306 10*3/uL (ref 150–400)
RBC: 2.4 MIL/uL — ABNORMAL LOW (ref 4.22–5.81)
RDW: 18.2 % — ABNORMAL HIGH (ref 11.5–15.5)
WBC: 7.9 10*3/uL (ref 4.0–10.5)

## 2013-09-11 ENCOUNTER — Encounter: Payer: Self-pay | Admitting: Internal Medicine

## 2013-09-14 ENCOUNTER — Ambulatory Visit (INDEPENDENT_AMBULATORY_CARE_PROVIDER_SITE_OTHER): Payer: No Typology Code available for payment source | Admitting: Family Medicine

## 2013-09-14 ENCOUNTER — Non-Acute Institutional Stay (HOSPITAL_COMMUNITY)
Admission: AD | Admit: 2013-09-14 | Discharge: 2013-09-14 | Disposition: A | Discharge status: Home / Self Care | Payer: Medicaid Other | Source: Ambulatory Visit | Attending: Internal Medicine | Admitting: Internal Medicine

## 2013-09-14 ENCOUNTER — Encounter (HOSPITAL_COMMUNITY): Payer: Self-pay | Admitting: Hematology

## 2013-09-14 ENCOUNTER — Encounter: Payer: Self-pay | Admitting: Family Medicine

## 2013-09-14 VITALS — BP 126/69 | HR 79 | Temp 98.5°F | Resp 20 | Ht 70.0 in | Wt 148.0 lb

## 2013-09-14 DIAGNOSIS — I509 Heart failure, unspecified: Secondary | ICD-10-CM | POA: Insufficient documentation

## 2013-09-14 DIAGNOSIS — D5700 Hb-SS disease with crisis, unspecified: Secondary | ICD-10-CM | POA: Diagnosis present

## 2013-09-14 DIAGNOSIS — N183 Chronic kidney disease, stage 3 unspecified: Secondary | ICD-10-CM | POA: Diagnosis not present

## 2013-09-14 DIAGNOSIS — Z79899 Other long term (current) drug therapy: Secondary | ICD-10-CM | POA: Diagnosis not present

## 2013-09-14 DIAGNOSIS — F17200 Nicotine dependence, unspecified, uncomplicated: Secondary | ICD-10-CM

## 2013-09-14 DIAGNOSIS — H548 Legal blindness, as defined in USA: Secondary | ICD-10-CM | POA: Diagnosis not present

## 2013-09-14 DIAGNOSIS — D571 Sickle-cell disease without crisis: Secondary | ICD-10-CM

## 2013-09-14 DIAGNOSIS — M25569 Pain in unspecified knee: Secondary | ICD-10-CM | POA: Diagnosis not present

## 2013-09-14 DIAGNOSIS — N179 Acute kidney failure, unspecified: Secondary | ICD-10-CM | POA: Diagnosis not present

## 2013-09-14 DIAGNOSIS — Z720 Tobacco use: Secondary | ICD-10-CM

## 2013-09-14 LAB — COMPREHENSIVE METABOLIC PANEL
ALT: 14 U/L (ref 0–53)
AST: 33 U/L (ref 0–37)
Albumin: 3.9 g/dL (ref 3.5–5.2)
Alkaline Phosphatase: 119 U/L — ABNORMAL HIGH (ref 39–117)
BUN: 54 mg/dL — ABNORMAL HIGH (ref 6–23)
CO2: 20 mEq/L (ref 19–32)
Calcium: 8.2 mg/dL — ABNORMAL LOW (ref 8.4–10.5)
Chloride: 102 mEq/L (ref 96–112)
Creatinine, Ser: 3.01 mg/dL — ABNORMAL HIGH (ref 0.50–1.35)
GFR calc Af Amer: 25 mL/min — ABNORMAL LOW (ref >90)
GFR calc non Af Amer: 22 mL/min — ABNORMAL LOW (ref >90)
Glucose, Bld: 101 mg/dL — ABNORMAL HIGH (ref 70–99)
Potassium: 4.7 mEq/L (ref 3.7–5.3)
Sodium: 137 mEq/L (ref 137–147)
Total Bilirubin: 2 mg/dL — ABNORMAL HIGH (ref 0.3–1.2)
Total Protein: 7.4 g/dL (ref 6.0–8.3)

## 2013-09-14 LAB — CBC WITH DIFFERENTIAL/PLATELET
Basophils Absolute: 0.1 10*3/uL (ref 0.0–0.1)
Basophils Relative: 1 % (ref 0–1)
Eosinophils Absolute: 0.4 10*3/uL (ref 0.0–0.7)
Eosinophils Relative: 3 % (ref 0–5)
HCT: 20.5 % — ABNORMAL LOW (ref 39.0–52.0)
Hemoglobin: 7.4 g/dL — ABNORMAL LOW (ref 13.0–17.0)
Lymphocytes Relative: 15 % (ref 12–46)
Lymphs Abs: 1.8 10*3/uL (ref 0.7–4.0)
MCH: 31.6 pg (ref 26.0–34.0)
MCHC: 36.1 g/dL — ABNORMAL HIGH (ref 30.0–36.0)
MCV: 87.6 fL (ref 78.0–100.0)
Monocytes Absolute: 1.9 10*3/uL — ABNORMAL HIGH (ref 0.1–1.0)
Monocytes Relative: 16 % — ABNORMAL HIGH (ref 3–12)
Neutro Abs: 7.6 10*3/uL (ref 1.7–7.7)
Neutrophils Relative %: 65 % (ref 43–77)
Platelets: 282 10*3/uL (ref 150–400)
RBC: 2.34 MIL/uL — ABNORMAL LOW (ref 4.22–5.81)
RDW: 18.2 % — ABNORMAL HIGH (ref 11.5–15.5)
WBC: 11.8 10*3/uL — ABNORMAL HIGH (ref 4.0–10.5)

## 2013-09-14 LAB — URINE MICROSCOPIC-ADD ON

## 2013-09-14 LAB — URINALYSIS, ROUTINE W REFLEX MICROSCOPIC
Bilirubin Urine: NEGATIVE
Glucose, UA: NEGATIVE mg/dL
Ketones, ur: NEGATIVE mg/dL
Leukocytes, UA: NEGATIVE
Nitrite: NEGATIVE
Protein, ur: 30 mg/dL — AB
Specific Gravity, Urine: 1.007 (ref 1.005–1.030)
Urobilinogen, UA: 0.2 mg/dL (ref 0.0–1.0)
pH: 6.5 (ref 5.0–8.0)

## 2013-09-14 LAB — BASIC METABOLIC PANEL
BUN: 53 mg/dL — ABNORMAL HIGH (ref 6–23)
CO2: 20 mEq/L (ref 19–32)
Calcium: 8.4 mg/dL (ref 8.4–10.5)
Chloride: 102 mEq/L (ref 96–112)
Creatinine, Ser: 2.79 mg/dL — ABNORMAL HIGH (ref 0.50–1.35)
GFR calc Af Amer: 28 mL/min — ABNORMAL LOW (ref >90)
GFR calc non Af Amer: 24 mL/min — ABNORMAL LOW (ref >90)
Glucose, Bld: 106 mg/dL — ABNORMAL HIGH (ref 70–99)
Potassium: 4.2 mEq/L (ref 3.7–5.3)
Sodium: 137 mEq/L (ref 137–147)

## 2013-09-14 LAB — RETICULOCYTES
RBC.: 2.34 MIL/uL — ABNORMAL LOW (ref 4.22–5.81)
Retic Count, Absolute: 241 10*3/uL — ABNORMAL HIGH (ref 19.0–186.0)
Retic Ct Pct: 10.3 % — ABNORMAL HIGH (ref 0.4–3.1)

## 2013-09-14 MED ORDER — HYDROMORPHONE HCL PF 2 MG/ML IJ SOLN
1.0000 mg | Freq: Once | INTRAMUSCULAR | Status: AC
  Administered 2013-09-14: 1 mg via INTRAVENOUS
  Filled 2013-09-14: qty 1

## 2013-09-14 MED ORDER — OXYCODONE-ACETAMINOPHEN 5-325 MG PO TABS: 1.0000 | ORAL_TABLET | Freq: Four times a day (QID) | ORAL | Status: DC | PRN

## 2013-09-14 MED ORDER — DEXTROSE-NACL 5-0.45 % IV SOLN
INTRAVENOUS | Status: DC
  Administered 2013-09-14 (×2): via INTRAVENOUS

## 2013-09-14 MED ORDER — OXYCODONE-ACETAMINOPHEN 5-325 MG PO TABS
1.0000 | ORAL_TABLET | Freq: Once | ORAL | Status: AC
Start: 2013-09-14 — End: 2013-09-14
  Administered 2013-09-14: 1 via ORAL
  Filled 2013-09-14: qty 1

## 2013-09-14 NOTE — Progress Notes (Signed)
Patient ID: Patrick [name], male   DOB: [birth year], 58 y.o.   MRN: [id number] Pt being discharged home. Pt in no distress at time of distress. Pain 2/10 on pain scale. PIV discontinued, dry gauze applied. Discharge instructions, prescriptions and copy given, pt verbalized understanding of discharge instructions. Pt transported to front lobby via wheelchair.

## 2013-09-14 NOTE — Discharge Summary (Signed)
Physician Discharge Summary  Patrick [name] [id number] DOB: 16-Apr-1956 DOA: 09/14/2013  PCP: Leana Gamer., MD  Admit date: 09/14/2013 Discharge date: 09/14/2013  Discharge Diagnoses:  Active Problems:   Acute kidney failure   Hb-SS disease with crisis   Discharge Condition: Stable  Disposition: Home  Diet:Low sodium Wt Readings from Last 3 Encounters:  09/14/13 148 lb (67.132 kg)  09/14/13 148 lb (67.132 kg)  09/07/13 146 lb (66.225 kg)    History of present illness:   Patient with history of sickle cell disease, chronic renal failure (stage III), and CHF reported to primary office for follow up of anemia and previous abnormal laboratory values. Patient is complaining that he started having increased pain to bilateral knees that increased on 09/12/2013. Patient maintains that he is out of pain medications since then. Current pain intensity is 8/10, described as constant, localized and non radiating. Pain is unrelieved by increased rest and elevation. Admits that pain increases with ambulation and increased activity. Reports that he has been taking all prescribed medications consistently. Patient admitted to day hospital for pain management. Will draw labs due to history of chronic renal failure and systolic and diastolic heart failure.   Hospital Course:  1. Sickle cell pain: Patient admitted to sickle cell day hospital for extended observation. Patient was given Dilaudid 1 mg IV X3, which improved bilateral knee pain. Pain intensity decreased from 8/10 to 2/10. Prescribed Oxycodone 5-325 mg every 6 hours as needed for moderate to severe pain #60 tablets. Continue Folic acid 1 mg daily.  2. Acute Kidney Injury: Reviewed patient's laboratory values. Patient's BUN and creatinine are increase at 54 and 3.01, will hydrate to maintain preload.  Patient currently not on ace inhibitor due to chronic kidney failure Discontinue Furosemide 40 mg twice daily Patient will continue  Carvedilol 25 mg BID, Hydralazine 25 mg three times daily, and Isosorbide mononitrate 30 mg daily Patient currently does not exhibit dyspnea or an increased work of breathing. Lungs clear on auscultation prior to discharge Ordered orthostatic vital signs, within normal limits  Patient's current weight is 148 lbs, dry weight is 150 lbs. We will hydrate the patient with hypotonic IVFs, received 729 ml (125  ml per hour). Reviewed urinalysis for proteinuria.   Consulted with Dr. Zigmund Daniel for review of treatment plan.  Consulted with Dr. Sung Amabile, about current laboratory values holding Furosemide for 48 hours.   Patient will follow up in office on 09/16/2013 for weight and repeat labs  Referred to [facility] for evaluation, awaiting appointment.     Discharge Exam:  Filed Vitals:   09/14/13 1320  BP: 116/83  Pulse: 75  Temp:   Resp: 16   Filed Vitals:   09/14/13 1128 09/14/13 1318 09/14/13 1319 09/14/13 1320  BP: 130/77 110/66 126/78 116/83  Pulse: 77 74 73 75  Temp: 97.7 F (36.5 C)     TempSrc: Oral     Resp: 18 16 16 16   Height: 5\' 10"  (1.778 m)     Weight: 148 lb (67.132 kg)     SpO2: 99%      Physical Examination General appearance: alert, cooperative, appears stated age and no distress  Head: Normocephalic, without obvious abnormality, atraumatic  Back: symmetric, no curvature. ROM normal. No CVA tenderness.  Lungs: clear to auscultation bilaterally  Chest wall: no tenderness  Heart: systolic murmur  Abdomen: soft, non-tender; bowel sounds normal; no masses, no organomegaly  Extremities:  Trace edema to right and left knees Pulses: 2+ and symmetric  Discharge Instructions   Future Appointments Provider Department Dept Phone   09/21/2013 3:00 PM Leana Gamer, MD [place] [phone]   09/23/2013 9:30 AM Kendell Bane, [place] [facility] [place]       Medication List    ASK your doctor about these  medications       aspirin EC 81 MG tablet  Take 81 mg by mouth daily.     carvedilol 25 MG tablet  Commonly known as:  COREG  Take 1 tablet (25 mg total) by mouth 2 (two) times daily with a meal.     folic acid 1 MG tablet  Commonly known as:  FOLVITE  Take 1 tablet (1 mg total) by mouth daily.     furosemide 40 MG tablet  Commonly known as:  LASIX  Take 40 mg by mouth 2 (two) times daily.     hydrALAZINE 25 MG tablet  Commonly known as:  APRESOLINE  Take 25 mg by mouth 3 (three) times daily.     isosorbide mononitrate 30 MG 24 hr tablet  Commonly known as:  IMDUR  Take 1 tablet (30 mg total) by mouth daily.     oxyCODONE-acetaminophen 5-325 MG per tablet  Commonly known as:  ROXICET  Take 0.5 tablets by mouth every 6 (six) hours as needed for severe pain.          The results of significant diagnostics from this hospitalization (including imaging, microbiology, ancillary and laboratory) are listed below for reference.    Significant Diagnostic Studies: Dg Chest 2 View  08/19/2013   CLINICAL DATA:  Short of breath for 4 days.  CHF.  EXAM: CHEST  2 VIEW  COMPARISON:  DG CHEST 2 VIEW dated 03/31/2013  FINDINGS: Mild CHF is present. There is cardiomegaly, pulmonary vascular congestion and interstitial pulmonary edema. Narrow a be tiny effusions with blunting of the costophrenic angles on the lateral view. No focal consolidation.  IMPRESSION: Mild CHF with interstitial pulmonary edema.   Electronically Signed   By: Dereck Ligas M.D.   On: 08/19/2013 19:15   Dg Foot Complete Right  08/31/2013   CLINICAL DATA:  Forefoot pain for 1 week.  No known injury.  EXAM: RIGHT FOOT COMPLETE - 3+ VIEW  COMPARISON:  None.  FINDINGS: The bones appear mildly demineralized. There is no evidence of acute fracture or dislocation. There are mild degenerative changes at the interphalangeal joint of the great toe. There is mild sclerosis within the calcaneal tuberosity on the lateral view without  discrete stress fracture. No focal soft tissue swelling is identified.  IMPRESSION: No acute osseous findings in the forefoot demonstrated. There is nonspecific mild sclerosis of the calcaneus and mild great toe interphalangeal degenerative changes.   Electronically Signed   By: Camie Patience M.D.   On: 08/31/2013 14:54    Microbiology: No results found for this or any previous visit (from the past 240 hour(s)).   Labs: Basic Metabolic Panel:  Recent Labs Lab 09/14/13 1141 09/14/13 1550  NA 137 137  K 4.7 4.2  CL 102 102  CO2 20 20  GLUCOSE 101* 106*  BUN 54* 53*  CREATININE 3.01* 2.79*  CALCIUM 8.2* 8.4   Liver Function Tests:  Recent Labs Lab 09/14/13 1141  AST 33  ALT 14  ALKPHOS 119*  BILITOT 2.0*  PROT 7.4  ALBUMIN 3.9   No results found for this basename: LIPASE, AMYLASE,  in the last 168 hours No results  found for this basename: AMMONIA,  in the last 168 hours CBC:  Recent Labs Lab 09/14/13 1141  WBC 11.8*  NEUTROABS 7.6  HGB 7.4*  HCT 20.5*  MCV 87.6  PLT 282   Cardiac Enzymes: No results found for this basename: CKTOTAL, CKMB, CKMBINDEX, TROPONINI,  in the last 168 hours BNP: No components found with this basename: POCBNP,  CBG: No results found for this basename: GLUCAP,  in the last 168 hours Ferritin: No results found for this basename: FERRITIN,  in the last 168 hours  Time coordinating discharge: 45  Signed:  Tamaira Ciriello M  09/14/2013, 5:20 PM

## 2013-09-14 NOTE — H&P (Signed)
[facility] History and Physical   Date: 09/14/2013  Patient name: Patrick [name] Medical record number: [id number] Date of birth: 1955/09/24 Age: 58 y.o. Gender: male PCP: MATTHEWS,MICHELLE A., MD  Attending physician: Elwyn Reach, MD  Chief Complaint: Bilateral knee pain  History of Present Illness: Patient with history of sickle cell disease, chronic renal failure (stage III), and CHF reported to primary office for follow up of anemia and previous abnormal laboratory values. Patient is complaining that he started having increased pain to bilateral knees that increased on 09/12/2013. Patient maintains that he is out of pain medications since then. Current pain intensity is 8/10, described as constant, localized and non radiating. Pain is unrelieved by increased rest and elevation. Admits that pain increases with ambulation and increased activity. Reports that he has been taking all prescribed medications consistently.  Patient admitted to day hospital for pain management. Will draw labs due to history of chronic renal failure and systolic and diastolic heart failure.   Meds: Prescriptions prior to admission  Medication Sig Dispense Refill  . aspirin EC 81 MG tablet Take 81 mg by mouth daily.      . carvedilol (COREG) 25 MG tablet Take 1 tablet (25 mg total) by mouth 2 (two) times daily with a meal.  60 tablet  6  . folic acid (FOLVITE) 1 MG tablet Take 1 tablet (1 mg total) by mouth daily.  30 tablet  11  . furosemide (LASIX) 40 MG tablet Take 40 mg by mouth 2 (two) times daily.      . hydrALAZINE (APRESOLINE) 25 MG tablet Take 25 mg by mouth 3 (three) times daily.       . isosorbide mononitrate (IMDUR) 30 MG 24 hr tablet Take 1 tablet (30 mg total) by mouth daily.  30 tablet  6  . oxyCODONE-acetaminophen (ROXICET) 5-325 MG per tablet Take 0.5 tablets by mouth every 6 (six) hours as needed for severe pain.  30 tablet  0    Allergies: Review of patient's allergies  indicates no known allergies. Past Medical History  Diagnosis Date  . Swelling of extremity, right   . Swelling abdomen   . Swelling of extremity, left   . CHF (congestive heart failure)   . Blood transfusion     "today is the second time I've had a blood transfusion" (11/21/2012)  . Sickle cell anemia   . Blood dyscrasia   . Exertional shortness of breath   . Legally blind    Past Surgical History  Procedure Laterality Date  . No past surgeries     Family History  Problem Relation Age of Onset  . Alcohol abuse Mother   . Liver disease Mother   . Cirrhosis Mother    History   Social History  . Marital Status: Divorced    Spouse Name: N/A    Number of Children: N/A  . Years of Education: N/A   Occupational History  . Not on file.   Social History Main Topics  . Smoking status: Current Every Day Smoker -- 0.25 packs/day for 40 years    Types: Cigarettes  . Smokeless tobacco: Never Used  . Alcohol Use: 0.0 oz/week     Comment: 11/21/2012 "used to drink a beer q now and then; last beer ~ 3 months ago"  . Drug Use: No  . Sexual Activity: Not Currently   Other Topics Concern  . Not on file   Social History Narrative  . No narrative on  file    Review of Systems: Constitutional: Negative Review of Systems  Constitutional: Negative   HENT: Positive for facial puffiness.  Eyes: Negative.  Respiratory: Negative.  Cardiovascular: Negative.  Gastrointestinal: Negative.  Endocrine: Negative.  Genitourinary: Negative. Negative for urgency and frequency.  Musculoskeletal: Positive for joint swelling and myalgias (bilateral knees).  Skin: Negative.  Allergic/Immunologic: Negative.  Neurological: Negative.  Hematological: Negative.  Psychiatric/Behavioral: Negative.   Physical Exam: Blood pressure 116/83, pulse 75, temperature 97.7 F (36.5 C), temperature source Oral, resp. rate 16, height 5\' 10"  (1.778 m), weight 148 lb (67.132 kg), SpO2 99.00%. BP 116/83  Pulse  75  Temp(Src) 97.7 F (36.5 C) (Oral)  Resp 16  Ht 5\' 10"  (1.778 m)  Wt 148 lb (67.132 kg)  BMI 21.24 kg/m2  SpO2 99% General appearance: alert, cooperative, appears stated age and mild distress Head: Normocephalic, without obvious abnormality, atraumatic Eyes: conjunctivae/corneas clear. PERRL, EOM's intact. Fundi benign. Ears: normal TM's and external ear canals both ears Nose: Nares normal. Septum midline. Mucosa normal. No drainage or sinus tenderness. Throat: lips, mucosa, and tongue normal; teeth and gums normal Back: symmetric, no curvature. ROM normal. No CVA tenderness. Lungs: clear to auscultation bilaterally Chest wall: no tenderness Heart: systolic murmur Abdomen: soft, non-tender; bowel sounds normal; no masses,  no organomegaly Extremities: edema right and left to prepatellar area Pulses: 2+ and symmetric Skin: Skin color, texture, turgor normal. No rashes or lesions Lymph nodes: Cervical, supraclavicular, and axillary nodes normal.  Lab results: Results for orders placed during the hospital encounter of 09/14/13 (from the past 24 hour(s))  CBC WITH DIFFERENTIAL     Status: Abnormal   Collection Time    09/14/13 11:41 AM      Result Value Ref Range   WBC 11.8 (*) 4.0 - 10.5 K/uL   RBC 2.34 (*) 4.22 - 5.81 MIL/uL   Hemoglobin 7.4 (*) 13.0 - 17.0 g/dL   HCT 20.5 (*) 39.0 - 52.0 %   MCV 87.6  78.0 - 100.0 fL   MCH 31.6  26.0 - 34.0 pg   MCHC 36.1 (*) 30.0 - 36.0 g/dL   RDW 18.2 (*) 11.5 - 15.5 %   Platelets 282  150 - 400 K/uL   Neutrophils Relative % 65  43 - 77 %   Lymphocytes Relative 15  12 - 46 %   Monocytes Relative 16 (*) 3 - 12 %   Eosinophils Relative 3  0 - 5 %   Basophils Relative 1  0 - 1 %   Neutro Abs 7.6  1.7 - 7.7 K/uL   Lymphs Abs 1.8  0.7 - 4.0 K/uL   Monocytes Absolute 1.9 (*) 0.1 - 1.0 K/uL   Eosinophils Absolute 0.4  0.0 - 0.7 K/uL   Basophils Absolute 0.1  0.0 - 0.1 K/uL   RBC Morphology POLYCHROMASIA PRESENT    RETICULOCYTES      Status: Abnormal   Collection Time    09/14/13 11:41 AM      Result Value Ref Range   Retic Ct Pct 10.3 (*) 0.4 - 3.1 %   RBC. 2.34 (*) 4.22 - 5.81 MIL/uL   Retic Count, Manual 241.0 (*) 19.0 - 186.0 K/uL  COMPREHENSIVE METABOLIC PANEL     Status: Abnormal   Collection Time    09/14/13 11:41 AM      Result Value Ref Range   Sodium 137  137 - 147 mEq/L   Potassium 4.7  3.7 - 5.3 mEq/L   Chloride  102  96 - 112 mEq/L   CO2 20  19 - 32 mEq/L   Glucose, Bld 101 (*) 70 - 99 mg/dL   BUN 54 (*) 6 - 23 mg/dL   Creatinine, Ser 3.01 (*) 0.50 - 1.35 mg/dL   Calcium 8.2 (*) 8.4 - 10.5 mg/dL   Total Protein 7.4  6.0 - 8.3 g/dL   Albumin 3.9  3.5 - 5.2 g/dL   AST 33  0 - 37 U/L   ALT 14  0 - 53 U/L   Alkaline Phosphatase 119 (*) 39 - 117 U/L   Total Bilirubin 2.0 (*) 0.3 - 1.2 mg/dL   GFR calc non Af Amer 22 (*) >90 mL/min   GFR calc Af Amer 25 (*) >90 mL/min    Imaging results:  No results found.   Assessment & Plan: 1. Sickle cell pain: Admit to day hospital for extended observation. Start IVF at [facility], will increase fluids after reviewing current lab values. Initiate Dilaudid IV 1 mg times 1 dose and re-assess.  2. Labs: CMP, CBC w/diff, Reticulocytes  Thomas Rhude M 09/14/2013, 2:00 PM

## 2013-09-14 NOTE — Discharge Instructions (Signed)
1. Patient to report to day hospital for labs on 09/16/2013.  2. Follow up with Dr. Carol Ada on 09/21/2013 at [facility]

## 2013-09-14 NOTE — Progress Notes (Signed)
Patient admitted to sickle cell day hospital for extended observation. Patient was given Dilaudid 1 mg IV, which improved bilateral knee pain.  Reviewed patient's laboratory values. Patient's BUN and creatinine are increase at 54 and 3.01, will hydrate to maintain preload. Patient currently does not exhibit dyspnea or an increased work of breathing.  Ordered orthostatic vital signs, within normal limits  Patient's current weight is 148 lbs, dry weight is 150 lbs. We will hydrate the patient with hypotonic IVFs times 1 liter.  Will order a urinalysis for proteinuria and  a test for fractional excretion of sodium to evaluate acute kidney injury.   Consulted with Dr. Zigmund Daniel for review of treatment plan.   Consulted with Dr. Sung Amabile, will hold diuretics for 48 hours. Patient will follow up in office on 09/16/2013 for weight and repeat laboratory values.   Will refer to [facility] for evaluation

## 2013-09-14 NOTE — Progress Notes (Signed)
   Subjective:    Patient ID: Patrick [name], male    DOB: 05-01-1956, 58 y.o.   MRN: [id number]  HPI Patient in office for follow up of anemia and abnormal laboratory values.  Also, patient in office complaining of  7/10 Bilateral  knee pain primarily at rest. Pain started. Maintains that pain increases to 9/10 with standing and ambulation. Describes pain as localized, throbbing, and non radiating. He states that it is difficult to transition from sitting to standing.   Review of Systems  Constitutional: Negative for fatigue (with ambulation).  HENT: Negative for facial swelling.   Eyes: Negative.   Respiratory: Negative.   Cardiovascular: Negative.  Negative for chest pain and palpitations.  Gastrointestinal: Negative.   Endocrine: Negative.   Genitourinary: Negative.  Negative for urgency and frequency.  Musculoskeletal: Positive for joint swelling and myalgias (bilateral knees).  Skin: Negative.   Allergic/Immunologic: Negative.   Neurological: Negative.  Negative for dizziness and facial asymmetry.  Hematological: Negative.   Psychiatric/Behavioral: Negative.        Objective:   Physical Exam  Constitutional: He is oriented to person, place, and time. He appears well-developed and well-nourished. He does not have a sickly appearance. He does not appear ill. He appears distressed (mild distress due to pain).  HENT:  Head: Normocephalic.  Eyes: Pupils are equal, round, and reactive to light.  Neck: Normal range of motion.  Cardiovascular: Normal rate and regular rhythm.   Murmur heard. Pulmonary/Chest: Effort normal and breath sounds normal.  Abdominal: Soft. Bowel sounds are normal.  Musculoskeletal:       Right knee: He exhibits decreased range of motion and swelling. Tenderness found.       Left knee: He exhibits decreased range of motion and swelling. Tenderness found.  Neurological: He is alert and oriented to person, place, and time.  Skin: Skin is warm and dry.   Psychiatric: He has a normal mood and affect. His behavior is normal.          Assessment & Plan:  1. Bilateral knee pain:  8/10 knee pain. Patient states that he is currently out of pain medications, he has not taken since Saturday. Patient will be admitted to day hospital for extended observation. Will order IV Dilaudid, Kpad for heat therapy, and IV fluids. Will check labs CBCw/diff,  2. Anemia: Patient states that he has mild pain and fatigue with ambulation. Will check CBC while in day hospital.

## 2013-09-15 ENCOUNTER — Telehealth: Payer: Self-pay

## 2013-09-15 NOTE — Addendum Note (Signed)
Encounter addended by: Jolaine Artist, MD on: 09/15/2013 10:44 PM<BR>     Documentation filed: Follow-up Section, LOS Section, Visit Diagnoses, Notes Section

## 2013-09-15 NOTE — Telephone Encounter (Signed)
R.Voss from [facility] contacted [facility] with info concerning Mr. A.Debord's referal appointment.Ms. Maryln Gottron stated that [facility] was "backed up' at this time and was not sure as to when said Pt would be able to be seen.Ms. Maryln Gottron stated her office tried to get Mr.Langenfeld in to be seen as many as 3 times back in Sept of last yr.all times unsuccessful.I stated Mr.McMcorkle had gone through some changes within those 2 weeks since we first made contact.Ms Maryln Gottron then stated to re-fax up to date paperwork.

## 2013-09-16 ENCOUNTER — Telehealth: Payer: Self-pay | Admitting: Family Medicine

## 2013-09-16 ENCOUNTER — Telehealth: Payer: Self-pay | Admitting: Internal Medicine

## 2013-09-16 NOTE — Discharge Summary (Signed)
Pt discussed with Dr. Haroldine Laws and he will hold Diuretics and resume on 3/14. Pt clinically stable at time of discharge. Discussed with NP Cammie Sickle. Agree with discharge home.

## 2013-09-16 NOTE — Telephone Encounter (Signed)
Notified patient to inquire about current condition due to the fact that patient cancelled appointment for labs. Patient states that he is unable to ambulate due to increased left knee pain and swelling. Patient maintains that he is out of pain medications and was unable to drop prescription off at the pharmacy after discharge from the day hospital on 09/14/2013. Patient notified the pharmacy of condition this morning and they are allowing his nephew to pick up his narcotic medications.  Patient states that he will start pain medication as soon as possible and rest this afternoon.  Reports that he will notify office of condition this afternoon and  reschedule lab appt. Discussed the importance of follow up appt at length.

## 2013-09-16 NOTE — Telephone Encounter (Signed)
Patient called to cancel appointment for labs today due to severe leg pain. Patient refused counsel from nurse.

## 2013-09-17 ENCOUNTER — Non-Acute Institutional Stay (HOSPITAL_COMMUNITY)
Admission: AD | Admit: 2013-09-17 | Discharge: 2013-09-17 | Disposition: A | Discharge status: Home / Self Care | Payer: No Typology Code available for payment source | Source: Ambulatory Visit | Attending: Internal Medicine | Admitting: Internal Medicine

## 2013-09-17 ENCOUNTER — Non-Acute Institutional Stay (HOSPITAL_COMMUNITY): Payer: No Typology Code available for payment source

## 2013-09-17 ENCOUNTER — Ambulatory Visit: Payer: Self-pay | Admitting: Internal Medicine

## 2013-09-17 ENCOUNTER — Telehealth (HOSPITAL_COMMUNITY): Payer: Self-pay | Admitting: *Deleted

## 2013-09-17 ENCOUNTER — Encounter (HOSPITAL_COMMUNITY): Payer: Self-pay | Admitting: *Deleted

## 2013-09-17 DIAGNOSIS — M109 Gout, unspecified: Secondary | ICD-10-CM | POA: Insufficient documentation

## 2013-09-17 DIAGNOSIS — M25569 Pain in unspecified knee: Secondary | ICD-10-CM | POA: Insufficient documentation

## 2013-09-17 DIAGNOSIS — D649 Anemia, unspecified: Secondary | ICD-10-CM | POA: Insufficient documentation

## 2013-09-17 DIAGNOSIS — Z7982 Long term (current) use of aspirin: Secondary | ICD-10-CM | POA: Insufficient documentation

## 2013-09-17 DIAGNOSIS — Z79899 Other long term (current) drug therapy: Secondary | ICD-10-CM | POA: Insufficient documentation

## 2013-09-17 DIAGNOSIS — N189 Chronic kidney disease, unspecified: Secondary | ICD-10-CM | POA: Insufficient documentation

## 2013-09-17 DIAGNOSIS — R609 Edema, unspecified: Secondary | ICD-10-CM | POA: Insufficient documentation

## 2013-09-17 DIAGNOSIS — D5700 Hb-SS disease with crisis, unspecified: Secondary | ICD-10-CM | POA: Insufficient documentation

## 2013-09-17 DIAGNOSIS — N179 Acute kidney failure, unspecified: Secondary | ICD-10-CM | POA: Insufficient documentation

## 2013-09-17 DIAGNOSIS — F17200 Nicotine dependence, unspecified, uncomplicated: Secondary | ICD-10-CM | POA: Insufficient documentation

## 2013-09-17 LAB — COMPREHENSIVE METABOLIC PANEL
ALT: 12 U/L (ref 0–53)
AST: 30 U/L (ref 0–37)
Albumin: 3.9 g/dL (ref 3.5–5.2)
Alkaline Phosphatase: 97 U/L (ref 39–117)
BUN: 48 mg/dL — ABNORMAL HIGH (ref 6–23)
CO2: 16 mEq/L — ABNORMAL LOW (ref 19–32)
Calcium: 9.1 mg/dL (ref 8.4–10.5)
Chloride: 99 mEq/L (ref 96–112)
Creatinine, Ser: 2.56 mg/dL — ABNORMAL HIGH (ref 0.50–1.35)
GFR calc Af Amer: 31 mL/min — ABNORMAL LOW (ref >90)
GFR calc non Af Amer: 27 mL/min — ABNORMAL LOW (ref >90)
Glucose, Bld: 105 mg/dL — ABNORMAL HIGH (ref 70–99)
Potassium: 4.2 mEq/L (ref 3.7–5.3)
Sodium: 134 mEq/L — ABNORMAL LOW (ref 137–147)
Total Bilirubin: 3.1 mg/dL — ABNORMAL HIGH (ref 0.3–1.2)
Total Protein: 8.2 g/dL (ref 6.0–8.3)

## 2013-09-17 LAB — CBC WITH DIFFERENTIAL/PLATELET
Basophils Absolute: 0.1 10*3/uL (ref 0.0–0.1)
Basophils Relative: 1 % (ref 0–1)
Eosinophils Absolute: 0 10*3/uL (ref 0.0–0.7)
Eosinophils Relative: 0 % (ref 0–5)
HCT: 20 % — ABNORMAL LOW (ref 39.0–52.0)
Hemoglobin: 7.1 g/dL — ABNORMAL LOW (ref 13.0–17.0)
Lymphocytes Relative: 7 % — ABNORMAL LOW (ref 12–46)
Lymphs Abs: 1 10*3/uL (ref 0.7–4.0)
MCH: 31 pg (ref 26.0–34.0)
MCHC: 35.5 g/dL (ref 30.0–36.0)
MCV: 87.3 fL (ref 78.0–100.0)
Monocytes Absolute: 2.4 10*3/uL — ABNORMAL HIGH (ref 0.1–1.0)
Monocytes Relative: 17 % — ABNORMAL HIGH (ref 3–12)
Neutro Abs: 10.7 10*3/uL — ABNORMAL HIGH (ref 1.7–7.7)
Neutrophils Relative %: 75 % (ref 43–77)
Platelets: 289 10*3/uL (ref 150–400)
RBC: 2.29 MIL/uL — ABNORMAL LOW (ref 4.22–5.81)
RDW: 18.1 % — ABNORMAL HIGH (ref 11.5–15.5)
WBC: 14.2 10*3/uL — ABNORMAL HIGH (ref 4.0–10.5)

## 2013-09-17 LAB — PREPARE RBC (CROSSMATCH)

## 2013-09-17 LAB — URIC ACID: Uric Acid, Serum: 12 mg/dL — ABNORMAL HIGH (ref 4.0–7.8)

## 2013-09-17 MED ORDER — PREDNISONE (PAK) 10 MG PO TABS: ORAL_TABLET | Freq: Every day | ORAL | Status: DC

## 2013-09-17 MED ORDER — HYDROMORPHONE HCL PF 2 MG/ML IJ SOLN
2.5000 mg | Freq: Once | INTRAMUSCULAR | Status: AC
  Administered 2013-09-17: 2.5 mg via INTRAVENOUS
  Filled 2013-09-17: qty 2

## 2013-09-17 MED ORDER — HYDROMORPHONE HCL PF 2 MG/ML IJ SOLN
2.0000 mg | Freq: Once | INTRAMUSCULAR | Status: AC
  Administered 2013-09-17: 2 mg via INTRAVENOUS
  Filled 2013-09-17: qty 1

## 2013-09-17 MED ORDER — HYDROMORPHONE HCL PF 2 MG/ML IJ SOLN
2.5000 mg | Freq: Once | INTRAMUSCULAR | Status: AC
Start: 2013-09-17 — End: 2013-09-17
  Administered 2013-09-17: 2.5 mg via INTRAVENOUS
  Filled 2013-09-17: qty 2

## 2013-09-17 MED ORDER — DEXTROSE-NACL 5-0.45 % IV SOLN
INTRAVENOUS | Status: DC
  Administered 2013-09-17: 14:00:00 via INTRAVENOUS

## 2013-09-17 MED ORDER — HYDROMORPHONE HCL PF 2 MG/ML IJ SOLN
1.6000 mg | Freq: Once | INTRAMUSCULAR | Status: AC
  Administered 2013-09-17: 1.6 mg via INTRAVENOUS
  Filled 2013-09-17: qty 1

## 2013-09-17 MED ORDER — PREDNISONE 50 MG PO TABS
60.0000 mg | ORAL_TABLET | Freq: Once | ORAL | Status: AC
  Administered 2013-09-17: 60 mg via ORAL
  Filled 2013-09-17: qty 1

## 2013-09-17 MED ORDER — HYDROCORTISONE NA SUCCINATE PF 100 MG IJ SOLR
60.0000 mg | Freq: Once | INTRAMUSCULAR | Status: DC
  Filled 2013-09-17 (×2): qty 1.2

## 2013-09-17 NOTE — Discharge Instructions (Signed)
Walker Use HOW TO TELL IF A WALKER IS THE RIGHT SIZE  With your arms hanging at your sides, the walker handles should be at wrist level. If you cannot find the exact fit, choose the height that is most comfortable.  If you have been instructed to not place weight on one of your legs, you may feel more comfortable with a shorter height. If you are using the walker for balance, you may prefer a taller height.  Adjust the height by using the push buttons on the legs of your walker.  In rest position, the back leg of the walkers should be no further ahead than your toes. With your hands resting on the grips, your elbows should be slightly bent at about a 30 degree angle.  Ask your physical therapist or caregiver if you have any concerns. HOW TO USE A STANDARD WALKER (NO WHEELS)  Pick your walker up (do not slide your walker) and place it one step length in front of you. The back legs of the walker should be no further ahead than your toes. You should not feel like you need to lean forward to keep your hands on the grips. As you set the walker down, make sure all 4 leg tips contact the ground at the same time.  Hold onto the walker for support and step forward with your weaker leg into the middle of the walker. Follow the weight bearing instructions your caregiver has given you.  Push down with your hands and step forward with your stronger leg.  Be careful not to let the walker get too far ahead of you as you walk.  Repeat the process for each step. HOW TO USE A FRONT-WHEELED WALKER  Slide your walker forward. The back legs of the walker should be no further ahead than your toes. You should not feel like you need to lean forward to keep your hands on the grips.  Hold onto the walker for support and step forward with your weaker leg into the middle of the walker. Follow the weight bearing instructions your caregiver has given you.  Push down with your hands and step forward with your stronger  leg.  Be careful not to let the walker get too far ahead of you as you walk.  Repeat the process for each step.  If your walker does not glide well over carpet, consider cutting an "x" in 2 old tennis balls and placing them over the back legs of your walker. STANDING UP FROM A CHAIR WITH ARMRESTS  It is best to sit in a firm chair with armrests.  Position your walker directly in front of your chair. Do not pull on the walker when standing up. It is too unstable to support weight when pulled on.  Slide forward in the chair, with your weaker leg ahead and stronger leg bent near the chair.  Lean forward and push up from your chair with both hands on the armrests. Straighten your stronger leg, rising to standing. Do not pull yourself up from the walker. This may cause it to tip.  When you feel steady on your feet, carefully move one hand at a time to the walker.  Stand for a few seconds to stabilize your balance before you start to walk. STANDING UP FROM A CHAIR WITHOUT ARMRESTS  It is best to sit in a firm chair. A low seat or an overstuffed chair or sofa is hard to get out of.  Place the walker in  front of you. Do not pull on the walker when coming to a standing position.  Slide forward in the chair, with your weaker leg ahead and stronger leg bent near the chair.  Push down on the chair seat with the hand opposite your weaker leg. Keep your other hand on the center of the walker's crossbar.  Stand, steady your balance, and place your hands on the walker handgrips. SITTING DOWN  Always back up toward your chair, using your walker, until you feel the back of your legs touch the chair.  If the chair has armrests, carefully reach back to put your hands on the armrests, and slowly lower your weight.  If the chair does not have armrests, consider backing up to the side of the chair. You can then hold onto the back of the chair and the front of the seat to slowly lower yourself.  You  should never feel like you are falling into your chair. USING A WALKER ON STEPS   Before attempting to use your walker on steps, practice with your physical therapist.  If you are going up a step wide enough to accommodate the entire walker and yourself:  First, place the walker up on the step.  Second, get your feet as close to the step as you can.  Third, press down on the walker with your hands as you step up with your stronger leg. Then step up with your weaker leg.  If you are going down a step wide enough to accommodate the entire walker and yourself:  First, place the walker down on the step.  Second, hold onto the walker as you step down with your weaker leg. Then step down with your stronger leg.  If you are going up more than 1 step and have a railing:  First, turn the walker sideways, so the opening is facing in toward you.  Second, place the front 2 legs of the walker on the first step. These front legs should be positioned at the base of the next step.  Third, test the steadiness of the walker. It should feel sturdy when you press down on the handgrip that is facing the top of the steps.  Finally, placing your weight on the railing and the walker, step up with your stronger leg first. Then step up with your weaker leg.  If you are going down more than 1 step and have a railing:  First, turn the walker sideways, so the opening is facing in toward you.  Second, place the front 2 legs of the walker down on the first step. When possible, the back legs of the walker should be positioned at the base of the previous step.  Third, test the steadiness of the walker. It should feel sturdy when you press down on the handgrip that is facing the top of the steps.  Finally, placing your weight on the railing and the walker, step down with your weaker leg first. Then step down with your stronger leg.  Be sure to check the sturdiness of the walker before each step.  Make sure  you have good rubber tips on the legs of your walker to prevent it from slipping. Document Released: 06/04/2005 Document Revised: 08/27/2011 Document Reviewed: 12/12/2010 [facility] Patient Information 2014 [place], [place]. Gout Gout is an inflammatory arthritis caused by a buildup of uric acid crystals in the joints. Uric acid is a chemical that is normally present in the blood. When the level of uric acid in  the blood is too high it can form crystals that deposit in your joints and tissues. This causes joint redness, soreness, and swelling (inflammation). Repeat attacks are common. Over time, uric acid crystals can form into masses (tophi) near a joint, destroying bone and causing disfigurement. Gout is treatable and often preventable. CAUSES  The disease begins with elevated levels of uric acid in the blood. Uric acid is produced by your body when it breaks down a naturally found substance called purines. Certain foods you eat, such as meats and fish, contain high amounts of purines. Causes of an elevated uric acid level include:  Being passed down from parent to child (heredity).  Diseases that cause increased uric acid production (such as obesity, psoriasis, and certain cancers).  Excessive alcohol use.  Diet, especially diets rich in meat and seafood.  Medicines, including certain cancer-fighting medicines (chemotherapy), water pills (diuretics), and aspirin.  Chronic kidney disease. The kidneys are no longer able to remove uric acid well.  Problems with metabolism. Conditions strongly associated with gout include:  Obesity.  High blood pressure.  High cholesterol.  Diabetes. Not everyone with elevated uric acid levels gets gout. It is not understood why some people get gout and others do not. Surgery, joint injury, and eating too much of certain foods are some of the factors that can lead to gout attacks. SYMPTOMS   An attack of gout comes on quickly. It causes intense pain with  redness, swelling, and warmth in a joint.  Fever can occur.  Often, only one joint is involved. Certain joints are more commonly involved:  Base of the big toe.  Knee.  Ankle.  Wrist.  Finger. Without treatment, an attack usually goes away in a few days to weeks. Between attacks, you usually will not have symptoms, which is different from many other forms of arthritis. DIAGNOSIS  Your caregiver will suspect gout based on your symptoms and exam. In some cases, tests may be recommended. The tests may include:  Blood tests.  Urine tests.  X-rays.  Joint fluid exam. This exam requires a needle to remove fluid from the joint (arthrocentesis). Using a microscope, gout is confirmed when uric acid crystals are seen in the joint fluid. TREATMENT  There are two phases to gout treatment: treating the sudden onset (acute) attack and preventing attacks (prophylaxis).  Treatment of an Acute Attack.  Medicines are used. These include anti-inflammatory medicines or steroid medicines.  An injection of steroid medicine into the affected joint is sometimes necessary.  The painful joint is rested. Movement can worsen the arthritis.  You may use warm or cold treatments on painful joints, depending which works best for you.  Treatment to Prevent Attacks.  If you suffer from frequent gout attacks, your caregiver may advise preventive medicine. These medicines are started after the acute attack subsides. These medicines either help your kidneys eliminate uric acid from your body or decrease your uric acid production. You may need to stay on these medicines for a very long time.  The early phase of treatment with preventive medicine can be associated with an increase in acute gout attacks. For this reason, during the first few months of treatment, your caregiver may also advise you to take medicines usually used for acute gout treatment. Be sure you understand your caregiver's directions. Your  caregiver may make several adjustments to your medicine dose before these medicines are effective.  Discuss dietary treatment with your caregiver or dietitian. Alcohol and drinks high in sugar and  fructose and foods such as meat, poultry, and seafood can increase uric acid levels. Your caregiver or dietician can advise you on drinks and foods that should be limited. HOME CARE INSTRUCTIONS   Do not take aspirin to relieve pain. This raises uric acid levels.  Only take over-the-counter or prescription medicines for pain, discomfort, or fever as directed by your caregiver.  Rest the joint as much as possible. When in bed, keep sheets and blankets off painful areas.  Keep the affected joint raised (elevated).  Apply warm or cold treatments to painful joints. Use of warm or cold treatments depends on which works best for you.  Use crutches if the painful joint is in your leg.  Drink enough fluids to keep your urine clear or pale yellow. This helps your body get rid of uric acid. Limit alcohol, sugary drinks, and fructose drinks.  Follow your dietary instructions. Pay careful attention to the amount of protein you eat. Your daily diet should emphasize fruits, vegetables, whole grains, and fat-free or low-fat milk products. Discuss the use of coffee, vitamin C, and cherries with your caregiver or dietician. These may be helpful in lowering uric acid levels.  Maintain a healthy body weight. SEEK MEDICAL CARE IF:   You develop diarrhea, vomiting, or any side effects from medicines.  You do not feel better in 24 hours, or you are getting worse. SEEK IMMEDIATE MEDICAL CARE IF:   Your joint becomes suddenly more tender, and you have chills or a fever. MAKE SURE YOU:   Understand these instructions.  Will watch your condition.  Will get help right away if you are not doing well or get worse. Document Released: 06/01/2000 Document Revised: 09/29/2012 Document Reviewed: 01/16/2012 [facility]  Patient Information 2014 [place].

## 2013-09-17 NOTE — Discharge Summary (Signed)
[facility] Discharge Summary   Patient ID: Patrick [name] MRN: [id number] DOB/AGE: 56-Sep-1957 56 y.o.  Admit date: 09/17/2013 Discharge date: 09/17/2013  Primary Care Physician:  MATTHEWS,MICHELLE A., MD  Admission Diagnoses:  Active Problems:   Acute kidney failure   Hb-SS disease with crisis   Discharge Diagnoses:   Gouty arthritis Sickle cell anemia Chronic kidney disease  Discharge Medications:    Medication List    ASK your doctor about these medications       aspirin EC 81 MG tablet  Take 81 mg by mouth daily.     carvedilol 25 MG tablet  Commonly known as:  COREG  Take 1 tablet (25 mg total) by mouth 2 (two) times daily with a meal.     folic acid 1 MG tablet  Commonly known as:  FOLVITE  Take 1 tablet (1 mg total) by mouth daily.     hydrALAZINE 25 MG tablet  Commonly known as:  APRESOLINE  Take 25 mg by mouth 3 (three) times daily.     isosorbide mononitrate 30 MG 24 hr tablet  Commonly known as:  IMDUR  Take 1 tablet (30 mg total) by mouth daily.     oxyCODONE-acetaminophen 5-325 MG per tablet  Commonly known as:  ROXICET  Take 1 tablet by mouth every 6 (six) hours as needed for severe pain.         Consults:  Dr. Zigmund Daniel  Significant Diagnostic Studies:  Dg Chest 2 View  08/19/2013   CLINICAL DATA:  Short of breath for 4 days.  CHF.  EXAM: CHEST  2 VIEW  COMPARISON:  DG CHEST 2 VIEW dated 03/31/2013  FINDINGS: Mild CHF is present. There is cardiomegaly, pulmonary vascular congestion and interstitial pulmonary edema. Narrow a be tiny effusions with blunting of the costophrenic angles on the lateral view. No focal consolidation.  IMPRESSION: Mild CHF with interstitial pulmonary edema.   Electronically Signed   By: Dereck Ligas M.D.   On: 08/19/2013 19:15   Dg Knee 1-2 Views Left  09/17/2013   CLINICAL DATA:  Knee pain, no acute injury, sickle cell patient  EXAM: LEFT KNEE - 1-2 VIEW  COMPARISON:  None  FINDINGS: Two views of  the right knee show no acute abnormality. There may be slight loss of joint space medially. However, on the lateral view there is evidence of a small to moderate-sized left knee joint effusion. Sclerosis within the proximal left tibia may represent bone infarct.  IMPRESSION: 1. Small to moderate size left knee joint effusion.  No fracture. 2.  Sclerosis in the proximal left tibia may represent bone infarct.   Electronically Signed   By: Ivar Drape M.D.   On: 09/17/2013 17:18   Dg Knee 1-2 Views Right  09/17/2013   CLINICAL DATA:  Knee pain, no injury, sickle cell patient  EXAM: RIGHT KNEE - 1-2 VIEW  COMPARISON:  None  FINDINGS: No significant degenerative change is seen. There is slight irregularity of the posterior aspect of the patella. There does appear to be a small to moderate size right knee joint effusion present. No fracture is noted.  IMPRESSION: Small to moderate size right knee joint effusion.  No fracture.   Electronically Signed   By: Ivar Drape M.D.   On: 09/17/2013 17:20   Dg Foot Complete Right  08/31/2013   CLINICAL DATA:  Forefoot pain for 1 week.  No known injury.  EXAM: RIGHT FOOT COMPLETE - 3+ VIEW  COMPARISON:  None.  FINDINGS: The bones appear mildly demineralized. There is no evidence of acute fracture or dislocation. There are mild degenerative changes at the interphalangeal joint of the great toe. There is mild sclerosis within the calcaneal tuberosity on the lateral view without discrete stress fracture. No focal soft tissue swelling is identified.  IMPRESSION: No acute osseous findings in the forefoot demonstrated. There is nonspecific mild sclerosis of the calcaneus and mild great toe interphalangeal degenerative changes.   Electronically Signed   By: Camie Patience M.D.   On: 08/31/2013 14:54     [facility] Course: 1. Sickle cell pain: Patient admitted to sickle cell day for extended observation. Patient reports that pain to bilateral knees decreased from 8/10  to 3/10 during day hospital course. Patient was given IV dilaudid per weight based rapid re-dosing times 4 doses.  Patient was able to ambulate with rolling walker prior to discharge without complication  2. Gouty arthritis: X-rays to knees bilaterally. Uric acid elevated. Patient started on Predisone Tapered dose pack after consulting with pharmacist at [facility]. Patient was given Prednisone 60 mg while at the day hospital. Given Rx for tapered dose #15 as written. Ordered rolling walker to assist with ambulation. Patient was education on how to use walker and demonstrated understanding per teach back method.  3. Chronic Kidney disease: BUN and Creatinine levels remain elevated, will recheck labs on 09/21/2013. Patient will restart Furosemide as indicated by cardiologist.   4. Anemia: After reviewing patient's cbc w/differential, patient was transfused 1 unit of packed red blood cells without complication. Patient to continue Folic acid 1 mg.  Will recheck labs on 09/21/2013   Physical Exam at Discharge:  BP 131/70  Pulse 89  Temp(Src) 99.2 F (37.3 C) (Oral)  Resp 18  SpO2 99% Head: Normocephalic, without obvious abnormality, atraumatic  Back: symmetric, no curvature. ROM normal. No CVA tenderness.  Lungs: clear to auscultation bilaterally  Chest wall: no tenderness  Heart: regular rate and rhythm, S1, S2 normal, murmur, click, rub or gallop  Abdomen: soft, non-tender; bowel sounds normal; no masses, no organomegaly  Extremities: edema Bilateral edema to knees bilaterally. Tender to palpation.  Pulses: 2+ and symmetric  Lymph nodes: Cervical, supraclavicular, and axillary nodes normal.  Neurologic: Alert and oriented X 3, normal strength and tone. Normal symmetric reflexes. Normal coordination and gait      Disposition at Discharge: 01-Home or Self Care  Discharge Orders:      Future Appointments Provider Department Dept Phone   09/21/2013 3:00 PM Leana Gamer, MD  [place] [phone]   09/23/2013 9:30 AM Kendell Bane, [place] at [place]      Condition at Discharge:   Stable  Time spent on Discharge:  Greater than 30 minutes.  Signed: Lamari Beckles M 09/17/2013, 8:03 PM

## 2013-09-17 NOTE — Progress Notes (Signed)
Patient discharged home. Patient in no apparent distress. Discharge instructions reviewed, walker use reviewed. Patient states understanding. Prescriptions given. Patient left day hospital with belongings via wheelchair escorted by nursing staff and family member. Patient transferred to car seat safely with assist x1. Patient to return to [facility] Monday 09/21/13 for labs, Patient acknowledges.

## 2013-09-17 NOTE — Telephone Encounter (Signed)
Called patient to follow up with patient condition and reschedule labs. Patient states he was able to take pain medication and has had no improvement; patient states worsening in swelling and pain with getting up to chair and took about an hour and half to get up enough energy to get back to the bed. This RN notified NP of patient condition. Patient instructed to come to [place]. Patient acknowledges and awaiting transportation, plans to be here around 1-130pm today. NP aware.

## 2013-09-17 NOTE — H&P (Signed)
[place] History and Physical  NATHANYL KARAGIANNIS [id number] DOB: 18-Jan-1956 DOA: 09/17/2013   PCP: MATTHEWS,MICHELLE A., MD   Chief Complaint: Bilateral knee pain  HPI: 57 year old male with a history of sickle cell anemia, Hb SS, chronic kidney failure, and systolic heart failure present to the day hospital complaining of bilateral knee pain and swelling. Patient reports that he had increased pain upon awakening on 09/16/2013. Patient notified the sickle cell center staff on 09/16/2013, reported that pain intensity was increased to the point that he could not apply weight or ambulate more than a few feet. Patient admits that he did not pick up pain medication from pharmacy on 09/15/2013. Patient was in clinic on 09/15/2013 for extended observation and hydration. Discontinue patient's furosemide for 48 hours after consulting cardiologist on 09/15/2013 of increasing BUN and Creatinine levels. Pt reports that pain and swelling to bilateral knees is different from recent sickle cell pain due to the fact that he is not able to ambulate without assistance. Current pain intensity is 8/10, described as constant, throbbing, localized, and non radiating in nature. Pain is unrelieved by Roxicet 5-325 mg last taken around 10 am.  Patient denies headache,  Fatigue, shortness of breath, chest pains,  nausea, vomiting, and/or diarrhea.    Review of Systems:  Constitutional: No weight loss, night sweats, Fevers, chills, fatigue.  HEENT: Patient legally blind with no headaches, dizziness, seizures, difficulty swallowing,Tooth/dental problems,Sore throat, No sneezing, itching, ear ache, nasal congestion, post nasal drip,  Cardio-vascular: No chest pain, Orthopnea, PND, swelling in lower extremities, anasarca, dizziness, palpitations  GI: No heartburn, indigestion, abdominal pain, nausea, vomiting, diarrhea, change in bowel habits, loss of appetite  Resp: No shortness of breath with exertion or at  rest. No excess mucus, no productive cough, No non-productive cough, No coughing up of blood.No change in color of mucus.No wheezing.No chest wall deformity  Skin: no rash or lesions.  GU: no dysuria, change in color of urine, no urgency or frequency. No flank pain.  Musculoskeletal: Joint pain and swelling present. Decreased range of motion. No back pain present.  Psych: No change in mood or affect. No depression or anxiety. No memory loss.    Past Medical History  Diagnosis Date  . Swelling of extremity, right   . Swelling abdomen   . Swelling of extremity, left   . CHF (congestive heart failure)   . Blood transfusion     "today is the second time I've had a blood transfusion" (11/21/2012)  . Sickle cell anemia   . Blood dyscrasia   . Exertional shortness of breath   . Legally blind    Past Surgical History  Procedure Laterality Date  . No past surgeries     Social History:  reports that he has been smoking Cigarettes.  He has a 10 pack-year smoking history. He has never used smokeless tobacco. He reports that he drinks alcohol. He reports that he does not use illicit drugs.  No Known Allergies  Family History  Problem Relation Age of Onset  . Alcohol abuse Mother   . Liver disease Mother   . Cirrhosis Mother     Prior to Admission medications   Medication Sig Start Date End Date Taking? Authorizing Provider  aspirin EC 81 MG tablet Take 81 mg by mouth daily.   Yes Historical Provider, MD  carvedilol (COREG) 25 MG tablet Take 1 tablet (25 mg total) by mouth 2 (two) times daily with a meal. 06/23/13  Yes Amy D [name], NP  folic acid (FOLVITE) 1 MG tablet Take 1 tablet (1 mg total) by mouth daily. 08/28/13  Yes Leana Gamer, MD  hydrALAZINE (APRESOLINE) 25 MG tablet Take 25 mg by mouth 3 (three) times daily.  08/22/13  Yes Charlynne Cousins, MD  isosorbide mononitrate (IMDUR) 30 MG 24 hr tablet Take 1 tablet (30 mg total) by mouth daily. 06/23/13  Yes Amy D [name], NP   oxyCODONE-acetaminophen (ROXICET) 5-325 MG per tablet Take 1 tablet by mouth every 6 (six) hours as needed for severe pain. 09/14/13  Yes Dorena Dew, FNP   Physical Exam: Filed Vitals:   09/17/13 1343  BP: 145/87  Pulse: 118  Temp: 98.7 F (37.1 C)  TempSrc: Oral  Resp: 22  SpO2: 100%   BP 131/70  Pulse 89  Temp(Src) 99.2 F (37.3 C) (Oral)  Resp 18  SpO2 99% General appearance: alert, cooperative and moderate distress Head: Normocephalic, without obvious abnormality, atraumatic Eyes: conjunctivae/corneas clear. PERRL, EOM's intact. Fundi benign. Ears: normal TM's and external ear canals both ears Nose: Nares normal. Septum midline. Mucosa normal. No drainage or sinus tenderness. Throat: lips, mucosa, and tongue normal; teeth and gums normal Neck: no adenopathy, no carotid bruit, no JVD, supple, symmetrical, trachea midline and thyroid not enlarged, symmetric, no tenderness/mass/nodules Back: symmetric, no curvature. ROM normal. No CVA tenderness. Lungs: clear to auscultation bilaterally Chest wall: no tenderness Heart: regular rate and rhythm, S1, S2 normal,  murmur, click, rub or gallop Abdomen: soft, non-tender; bowel sounds normal; no masses,  no organomegaly Extremities: Bilateral edema to knees bilaterally.  Warm and Tender to palpation. Right knee edema greater than left.  Decreased range of motion Pulses: 2+ and symmetric Lymph nodes: Cervical, supraclavicular, and axillary nodes normal. Neurologic: Alert and oriented X 3, normal strength and tone. Normal symmetric reflexes. Normal coordination and gait  Labs on Admission:   Basic Metabolic Panel:  Recent Labs Lab 09/14/13 1141 09/14/13 1550  NA 137 137  K 4.7 4.2  CL 102 102  CO2 20 20  GLUCOSE 101* 106*  BUN 54* 53*  CREATININE 3.01* 2.79*  CALCIUM 8.2* 8.4   Liver Function Tests:  Recent Labs Lab 09/14/13 1141  AST 33  ALT 14  ALKPHOS 119*  BILITOT 2.0*  PROT 7.4  ALBUMIN 3.9    CBC:  Recent Labs Lab 09/14/13 1141  WBC 11.8*  NEUTROABS 7.6  HGB 7.4*  HCT 20.5*  MCV 87.6  PLT 282   BNP: No components found with this basename: POCBNP,    Radiological Exams on Admission: No results found.  EKG: Independently reviewed.  Assessment/Plan: Active Problems:   Acute kidney failure   Hb-SS disease with crisis Anemia  1. Bilateral Knee pain: Initiate hypotonic IV fluids at [facility] until labs are reviewed due to previously elevate BUN and Creatinine levels. Start IV dilaudid per weight based rapid re-dosing. Will obtain bilateral x-rays to knees. Will manage pain closely, while in clinic 2. Bilateral knee edema: Will elevate extremities to heart level and order Kpad. Will check uric acid level 2. Acute Kidney Failure: Discontinued Furosemide 40 mg twice daily on 09/14/2013 after consulting with patient's cardiologist. Will check CMP.  3. Anemia: Patient was anemic on 09/14/2012, will check cbc w/ differential. Goal hemoglobin is 8.0 per previous note by Dr. Liston Alba. Will order type and screen and hold 1 unit of blood. Will transfuse 1 unit of packed red blood cells if hemoglobin is less than goal.  Patient maintains that he took Folic acid 1 mg this am.  4. Tobacco dependence: Patient reports that he has decrease cigarette smoking due to current pain intensity. Patient states that he is ready to quit, but has not set a quit day as of yet.    Time spend: 45 Code Status: Full Family Communication: None Disposition Plan: Home when stable  Dorena Dew, MD  Pager [phone]  If 7PM-7AM, please contact night-coverage [email or web address] Password [facility] 09/17/2013, 2:23 PM

## 2013-09-18 ENCOUNTER — Telehealth (HOSPITAL_COMMUNITY): Payer: Self-pay | Admitting: Family Medicine

## 2013-09-18 NOTE — Telephone Encounter (Signed)
Inquired about patient's current condition following admission for extended observation at the sickle cell day hospital. Patient maintains that current pain intensity has decreased to 2-3/10. Intensity increases after ambulating. Reports that swelling has decreased in right knee, and remains slightly swollen in left. Patient is using rolling walker to ambulate. Reminded patient that he is to follow up in clinic for labs on Monday morning at 11 am. Also, patient has an appointment with Dr. Benson Norway, gastroenterologist on Monday, April 6th for evaluation. Patient expressed understanding.

## 2013-09-21 ENCOUNTER — Encounter: Payer: No Typology Code available for payment source | Admitting: Internal Medicine

## 2013-09-21 ENCOUNTER — Other Ambulatory Visit: Payer: No Typology Code available for payment source

## 2013-09-21 ENCOUNTER — Other Ambulatory Visit: Payer: Self-pay | Admitting: Internal Medicine

## 2013-09-21 ENCOUNTER — Other Ambulatory Visit: Payer: Self-pay | Admitting: Gastroenterology

## 2013-09-21 DIAGNOSIS — D571 Sickle-cell disease without crisis: Secondary | ICD-10-CM

## 2013-09-21 DIAGNOSIS — N183 Chronic kidney disease, stage 3 unspecified: Secondary | ICD-10-CM

## 2013-09-21 LAB — TYPE AND SCREEN
ABO/RH(D): AB POS
Antibody Screen: NEGATIVE
Unit division: 0
Unit division: 0

## 2013-09-22 LAB — CBC WITH DIFFERENTIAL/PLATELET
Basophils Absolute: 0 10*3/uL (ref 0.0–0.1)
Basophils Relative: 0 % (ref 0–1)
Eosinophils Absolute: 0 10*3/uL (ref 0.0–0.7)
Eosinophils Relative: 0 % (ref 0–5)
HCT: 22 % — ABNORMAL LOW (ref 39.0–52.0)
Hemoglobin: 7.4 g/dL — ABNORMAL LOW (ref 13.0–17.0)
Lymphocytes Relative: 18 % (ref 12–46)
Lymphs Abs: 1.6 10*3/uL (ref 0.7–4.0)
MCH: 29.2 pg (ref 26.0–34.0)
MCHC: 33.6 g/dL (ref 30.0–36.0)
MCV: 87 fL (ref 78.0–100.0)
Monocytes Absolute: 1.2 10*3/uL — ABNORMAL HIGH (ref 0.1–1.0)
Monocytes Relative: 14 % — ABNORMAL HIGH (ref 3–12)
Neutro Abs: 6.1 10*3/uL (ref 1.7–7.7)
Neutrophils Relative %: 68 % (ref 43–77)
Platelets: 269 10*3/uL (ref 150–400)
RBC: 2.53 MIL/uL — ABNORMAL LOW (ref 4.22–5.81)
RDW: 17.8 % — ABNORMAL HIGH (ref 11.5–15.5)
WBC: 8.9 10*3/uL (ref 4.0–10.5)

## 2013-09-22 LAB — COMPLETE METABOLIC PANEL WITH GFR
ALT: 10 U/L (ref 0–53)
AST: 15 U/L (ref 0–37)
Albumin: 3.8 g/dL (ref 3.5–5.2)
Alkaline Phosphatase: 76 U/L (ref 39–117)
BUN: 81 mg/dL — ABNORMAL HIGH (ref 6–23)
CO2: 18 mEq/L — ABNORMAL LOW (ref 19–32)
Calcium: 8.1 mg/dL — ABNORMAL LOW (ref 8.4–10.5)
Chloride: 102 mEq/L (ref 96–112)
Creat: 2.44 mg/dL — ABNORMAL HIGH (ref 0.50–1.35)
GFR, Est African American: 33 mL/min — ABNORMAL LOW
GFR, Est Non African American: 29 mL/min — ABNORMAL LOW
Glucose, Bld: 100 mg/dL — ABNORMAL HIGH (ref 70–99)
Potassium: 3.8 mEq/L (ref 3.5–5.3)
Sodium: 131 mEq/L — ABNORMAL LOW (ref 135–145)
Total Bilirubin: 1.9 mg/dL — ABNORMAL HIGH (ref 0.2–1.2)
Total Protein: 6.8 g/dL (ref 6.0–8.3)

## 2013-09-23 ENCOUNTER — Ambulatory Visit: Payer: Self-pay | Admitting: Podiatry

## 2013-09-24 ENCOUNTER — Encounter: Payer: Self-pay | Admitting: Internal Medicine

## 2013-09-24 ENCOUNTER — Ambulatory Visit (INDEPENDENT_AMBULATORY_CARE_PROVIDER_SITE_OTHER): Payer: No Typology Code available for payment source | Admitting: Internal Medicine

## 2013-09-24 VITALS — BP 128/76 | HR 73 | Temp 98.2°F | Resp 20 | Ht 70.0 in | Wt 146.0 lb

## 2013-09-24 DIAGNOSIS — R739 Hyperglycemia, unspecified: Secondary | ICD-10-CM

## 2013-09-24 DIAGNOSIS — Z23 Encounter for immunization: Secondary | ICD-10-CM

## 2013-09-24 DIAGNOSIS — E790 Hyperuricemia without signs of inflammatory arthritis and tophaceous disease: Secondary | ICD-10-CM

## 2013-09-24 DIAGNOSIS — M109 Gout, unspecified: Secondary | ICD-10-CM

## 2013-09-24 DIAGNOSIS — D649 Anemia, unspecified: Secondary | ICD-10-CM

## 2013-09-24 DIAGNOSIS — F17200 Nicotine dependence, unspecified, uncomplicated: Secondary | ICD-10-CM

## 2013-09-24 DIAGNOSIS — D571 Sickle-cell disease without crisis: Secondary | ICD-10-CM

## 2013-09-24 DIAGNOSIS — R7989 Other specified abnormal findings of blood chemistry: Secondary | ICD-10-CM

## 2013-09-24 DIAGNOSIS — N183 Chronic kidney disease, stage 3 unspecified: Secondary | ICD-10-CM

## 2013-09-24 DIAGNOSIS — R7309 Other abnormal glucose: Secondary | ICD-10-CM

## 2013-09-24 DIAGNOSIS — E559 Vitamin D deficiency, unspecified: Secondary | ICD-10-CM

## 2013-09-24 NOTE — Progress Notes (Signed)
Subjective:    Patient ID: Patrick [name], male    DOB: [birth year], 58 y.o.   MRN: [id number]  HPI: Pt here to finally obtain his annual examination. He states that he is feeling well today. He reports that his vision aids from the vision impaired services have arrived and he will be meeting with the representative in the next few days.  Pt denies and problems with urination, erection or bowel elimination. He has ben eating well in the past few days.   With regard to his knee pain he feels that he has recovered fully. He only has very mild pain of sickle cell  Which he rates as 1/10 at present. Pain is localized to his back and is non-radiating.    Review of Systems  Constitutional: Negative.   HENT: Negative.   Eyes: Positive for visual disturbance.  Respiratory: Negative.   Cardiovascular: Negative.   Gastrointestinal: Negative.   Endocrine: Negative.   Genitourinary: Negative.   Musculoskeletal: Positive for myalgias.  Skin: Negative.   Allergic/Immunologic: Negative.   Neurological: Negative.   Hematological: Negative.   Psychiatric/Behavioral: Negative.        Objective:   Physical Exam  Vitals reviewed. Constitutional: He is oriented to person, place, and time. He appears well-developed and well-nourished.  HENT:  Head: Atraumatic.  Eyes: Conjunctivae and EOM are normal. Pupils are equal, round, and reactive to light. No scleral icterus.  Neck: Normal range of motion. Neck supple.  Cardiovascular: Normal rate and regular rhythm.  Exam reveals no gallop and no friction rub.   No murmur heard. Pulmonary/Chest: Effort normal and breath sounds normal. He has no wheezes. He has no rales. He exhibits no tenderness.  Abdominal: Soft. Bowel sounds are normal. He exhibits no mass.  Genitourinary: Penis normal. No penile tenderness.  Musculoskeletal: Normal range of motion.  Neurological: He is alert and oriented to person, place, and time.  Pt functionally blind   Skin: Skin is warm and dry.  Psychiatric: He has a normal mood and affect. His behavior is normal. Judgment and thought content normal.    Recent labs reviewed. Na 131, K 3.8, BUN 81, Cr 2.44.      Assessment & Plan:  1. Annual Examination: Pt seen by Dr. Benson Norway and he will schedule at hospital location. No Testicular masses noted. EKG not performed as patient is under care of Cardiologist.  2. Tobacco use disorder: Pt has cut down and wants to quit smoking. He's currently smoking about 6 cigarettes/day.   3. Vision Impaired secondary to Sickle Cell retinopathy. Pt connected to vision impaired services. He has equipment ordered.   4. CHF: Under care of Cardiology. Taking Lasix 40 mg daily.  5. CKD: Pt referred to [facility]. Pt has not yet heard from [facility] for appointment. WIll follow-up. Will Check Renal panel on Monday.  6. Anemia: Pt was transfused  1 unit of blood last week. Will check Hb on Monday. He has low ferrtins despite multiple transfusions. He is scheduled to see GI for colonoscopy.   7. Right Knee Pain: Pt was evaluated for right knee pain and treated with Prednisone for a suspicion of gout. He had an elevated uric acid level, however a joint aspiration was not performed as patient could not tolerate. He may be a candidate for allopurinol renally adjusted if he has another episode.  8. Sickle cell disease Hb SS - Start Hydrea 500 mg daily. We discussed the need for good hydration, monitoring of hydration status,  avoidance of heat, cold, stress, and infection triggers. We discussed the risks and benefits of Hydrea, including bone marrow suppression, the possibility of GI upset, skin ulcers, hair thinning, and teratogenicity. The patient was reminded of the need to seek medical attention of any symptoms of bleeding, anemia, or infection. Continue folic acid 1 mg daily to prevent aplastic bone marrow crises.   - Pulmonary evaluation - Patient denies severe recurrent  wheezes, shortness of breath with exercise, or persistent cough. If these symptoms develop, pulmonary function tests with spirometry will be ordered, and if abnormal, plan on referral to Pulmonology for further evaluation.  - Cardiac - Pt is under care of Cardiology for CHF (systolic and diastolic dysfunction).  - Eye - Pt has almost complete vision loss secondary to retinopathy of Sickle Cell Disease.  -. Immunization status - He is up to date on his immunizations including Influenza and Pneumonia and will receive TDAP today.    - Acute and chronic painful episodes - Pt using very small doses of Percocet and very infrequently. We discussed that he is to receive her Schedule II prescriptions only from [place]. He is also aware that his prescription history is available to [place] online through the [place]. Controlled substance agreement signed (Date). We reminded (Pt) that all patients receiving Schedule II narcotics must be seen for follow up every three months. We reviewed the terms of our pain agreement, including the need to keep medicines in a safe locked location away from children or pets, and the need to report excess sedation or constipation, measures to avoid constipation, and policies related to early refills and stolen prescriptions. According to the [place] Chronic [facility] Initiative program, we have reviewed details related to analgesia, adverse effects, aberrant behaviors.  7. Iron overload from chronic transfusion.  Despite multiple blood transfusions, pat has very low Ferritin levels and is being evaluated by GI for blood loss.  8. Vitamin D deficiency - Will check Vitamin D levels  The above recommendations are taken from the NIH Evidence-Based Management of Sickle Cell Disease: Expert Panel Report, 20149.

## 2013-09-25 ENCOUNTER — Encounter: Payer: Self-pay | Admitting: Internal Medicine

## 2013-09-25 DIAGNOSIS — E559 Vitamin D deficiency, unspecified: Secondary | ICD-10-CM | POA: Insufficient documentation

## 2013-09-25 DIAGNOSIS — N184 Chronic kidney disease, stage 4 (severe): Secondary | ICD-10-CM | POA: Insufficient documentation

## 2013-09-25 DIAGNOSIS — Z23 Encounter for immunization: Secondary | ICD-10-CM | POA: Insufficient documentation

## 2013-09-25 DIAGNOSIS — E790 Hyperuricemia without signs of inflammatory arthritis and tophaceous disease: Secondary | ICD-10-CM | POA: Insufficient documentation

## 2013-09-25 DIAGNOSIS — D571 Sickle-cell disease without crisis: Secondary | ICD-10-CM | POA: Insufficient documentation

## 2013-09-25 DIAGNOSIS — D649 Anemia, unspecified: Secondary | ICD-10-CM | POA: Insufficient documentation

## 2013-09-27 NOTE — Discharge Summary (Signed)
Pt treated for acute sickle cell crisis. I have high index of suspicion for acute gouty arthritis. He received oral prednisone and will be given a tapering dose over the next 5 days. Colchicine is contraindicated for this patient secondary to CKD III. Will re-e val as out patient and consider joint aspiration for definitive diagnosis. Pt examined and discussed with NP Cammie Sickle. Agree with assessment and plan.

## 2013-09-27 NOTE — H&P (Signed)
Pt presets with Acute Sickle Cell crisis likely precipitated by pain in knee L>R which is likely due to Acute gouty arthritis. We will treat acute crisis with IVF, IV Dilaudid. With regard to knee pain, I will obtain x-ray of left knee. Pt does not exhibit any signs of acute systemic infection and joint does not appear septic. Will follow up after x-ray completed.

## 2013-09-28 ENCOUNTER — Encounter (HOSPITAL_COMMUNITY): Payer: Self-pay | Admitting: Pharmacy Technician

## 2013-09-28 ENCOUNTER — Telehealth: Payer: Self-pay

## 2013-09-28 NOTE — H&P (Signed)
Agree with notes above

## 2013-09-28 NOTE — Telephone Encounter (Signed)
Up to date Dr.'s notes as well as labs and med list were faxed over to [facility] as requested per request of NP [place] Hollis for a 2nd time on 09/25/2013.At this time Pt is still awaiting an appointment to be seen by this clinic.

## 2013-09-30 ENCOUNTER — Encounter (HOSPITAL_COMMUNITY): Payer: Self-pay | Admitting: *Deleted

## 2013-10-16 ENCOUNTER — Ambulatory Visit (HOSPITAL_COMMUNITY): Admit: 2013-10-16 | Payer: MEDICAID | Admitting: Gastroenterology

## 2013-10-16 SURGERY — COLONOSCOPY
Anesthesia: Monitor Anesthesia Care

## 2013-10-28 ENCOUNTER — Ambulatory Visit (HOSPITAL_COMMUNITY)
Admission: RE | Admit: 2013-10-28 | Discharge: 2013-10-28 | Disposition: A | Discharge status: Home / Self Care | Payer: No Typology Code available for payment source | Source: Ambulatory Visit | Attending: Cardiology | Admitting: Cardiology

## 2013-10-28 ENCOUNTER — Encounter (HOSPITAL_COMMUNITY): Payer: Self-pay

## 2013-10-28 VITALS — BP 110/60 | HR 80 | Wt 144.0 lb

## 2013-10-28 DIAGNOSIS — D571 Sickle-cell disease without crisis: Secondary | ICD-10-CM | POA: Insufficient documentation

## 2013-10-28 DIAGNOSIS — F17200 Nicotine dependence, unspecified, uncomplicated: Secondary | ICD-10-CM | POA: Insufficient documentation

## 2013-10-28 DIAGNOSIS — Z7982 Long term (current) use of aspirin: Secondary | ICD-10-CM | POA: Insufficient documentation

## 2013-10-28 DIAGNOSIS — N183 Chronic kidney disease, stage 3 unspecified: Secondary | ICD-10-CM | POA: Insufficient documentation

## 2013-10-28 DIAGNOSIS — Z79899 Other long term (current) drug therapy: Secondary | ICD-10-CM | POA: Insufficient documentation

## 2013-10-28 DIAGNOSIS — I509 Heart failure, unspecified: Secondary | ICD-10-CM | POA: Insufficient documentation

## 2013-10-28 DIAGNOSIS — I5022 Chronic systolic (congestive) heart failure: Secondary | ICD-10-CM | POA: Insufficient documentation

## 2013-10-28 DIAGNOSIS — I129 Hypertensive chronic kidney disease with stage 1 through stage 4 chronic kidney disease, or unspecified chronic kidney disease: Secondary | ICD-10-CM | POA: Insufficient documentation

## 2013-10-28 DIAGNOSIS — Z720 Tobacco use: Secondary | ICD-10-CM

## 2013-10-28 MED ORDER — HYDRALAZINE HCL 25 MG PO TABS: 37.5000 mg | ORAL_TABLET | Freq: Three times a day (TID) | ORAL | Status: DC

## 2013-10-28 MED ORDER — FUROSEMIDE 40 MG PO TABS
40.0000 mg | ORAL_TABLET | Freq: Every day | ORAL | Status: DC
Start: 2013-10-28 — End: 2013-12-01

## 2013-10-28 NOTE — Patient Instructions (Signed)
Increase Hydralazine to 1 & 1/2 tabs (37.5 mg) Three times a day   Your physician recommends that you schedule a follow-up appointment in: 1 month with echocardiogram

## 2013-10-29 NOTE — Progress Notes (Signed)
Patient ID: Patrick [name], male   DOB: 03-23-1956, 58 y.o.   MRN: [id number]  Weight Range    145-148 pounds  Baseline proBNP   993.1 on 01/19/12  PCP: [name] : Dr Zigmund Daniel  Opthalmologist: [name]  Guide IT Trial   HPI: Patrick [name] is a 58 y.o. African American male with a history of sickle cell anemia, systolic HF due to NICM EF 40% 11/2012 initially diagnosed 05/2011, no CAD by cath 05/2011, CKD stage III, legally blind, and tobacco abuse.   Admitted 11/18/12 from HF clinic with voume overload. Hemoglobin 6.7 Transfused 2 UPRBC. Diuresed with IV lasix. Lisinopril stopped due to CKD. Discharge weight 143 pounds.    Admitted 02/07/13 through 02/12/13 after being treated for N/V/D, and abdominal pain. CT of abdomen- cholelithias with plans to f/u for elective cholecystectomy. Received 2UPRBC.  Hemoglobin 6.1>7.1.  Diuretics held due to dehydration. He restarted lasix 60 mg bid 02/13/13.   Admitted to [facility] 3/4 through 08/22/13 with anemia and volume overload. Hemglobin was 5.9. Received 1U PRBC with hemoglobin increased to 6.4 Diuresed with IV lasix. Discharged on carvedilol 25 mg twice a day, lasix 80 mg twice a day, hydralazine 25 mg tid, and imdur 30 mg daily.  D/C weight was 140 pounds.  He is not on Spironolactone due to hyperkalemia. He is not on ace inhibitor due to renal failure.  He returns for follow up. He has had some gout pain in Patrick knee.  He had a pain crisis in 4/15.  He is not having any exertional dyspnea.  Weight is stable.  No orthopnea, PND, palpitations.    01/17/12 ECHO 55%-60%  11/20/12 ECHO EF 40% Peak PA pressure 56  Labs        02/17/13: K+ 5.0, BUN 45, Creatinine 2.2, proBNP 10135       05/21/13 K 5.4 Creatinine 2.25 Pro BNP 3076        06/09/13 K 4.6 Creatinine 2.1 Pro BNP 10,727        07/07/13 Creatinine 2.06 K 5.0         08/19/13 Creatinine 2.5 Hemoglobin 5.9 Pro BNP 8266         08/27/13 K 4.5 Creatinine 2.78 Hemoglobin 7.2        4/15 K 4.2, creatinine  2.56     SH: Lives with Patrick [name] in [place]. Smokes 5 -6 cigarettes per day.   ROS: All systems negative except as listed in HPI, PMH and Problem List.  Past Medical History  Diagnosis Date  . Swelling of extremity, right   . Swelling abdomen   . Swelling of extremity, left   . CHF (congestive heart failure)   . Blood transfusion     "today is the second time I've had a blood transfusion" (11/21/2012)  . Sickle cell anemia   . Blood dyscrasia   . Exertional shortness of breath   . Legally blind     Current Outpatient Prescriptions  Medication Sig Dispense Refill  . aspirin EC 81 MG tablet Take 81 mg by mouth daily.      . carvedilol (COREG) 25 MG tablet Take 25 mg by mouth 2 (two) times daily with a meal.      . folic acid (FOLVITE) 1 MG tablet Take 1 mg by mouth daily.      . hydrALAZINE (APRESOLINE) 25 MG tablet Take 1.5 tablets (37.5 mg total) by mouth 3 (three) times daily.  135 tablet  3  .  isosorbide mononitrate (IMDUR) 30 MG 24 hr tablet Take 30 mg by mouth every morning.      Marland Kitchen oxyCODONE-acetaminophen (PERCOCET/ROXICET) 5-325 MG per tablet Take 1 tablet by mouth every 6 (six) hours as needed for moderate pain or severe pain.      . furosemide (LASIX) 40 MG tablet Take 1 tablet (40 mg total) by mouth daily.  90 tablet  3   No current facility-administered medications for this encounter.    Filed Vitals:   10/28/13 1502  BP: 110/60  Pulse: 80  Weight: 144 lb (65.318 kg)  SpO2: 98%    PHYSICAL EXAM: General:  Chronically ill appearing.  No resp difficulty  HEENT: normal except for mild scleral icterus Neck: supple. JVP 7. Carotids 2+ bilaterally; no bruits. No lymphadenopathy or thryomegaly appreciated. Cor: PMI normal Regular rate & rhythm. No rubs, gallops 2/6 TR and 2/6 MR Lungs: clear    Abdomen: soft, nontender, + distended. No hepatosplenomegaly. No bruits or masses. Good bowel sounds. Extremities: no cyanosis, rash,  edema ,  + clubbing. Neuro: alert &  orientedx3, cranial nerves grossly intact. Moves all 4 extremities w/o difficulty. Affect pleasant.  ASSESSMENT & PLAN: 1. Chronic Systolic Heart Failure:  NICM. Cath 05/2011 without significant coronary disease.  ECHO 11/20/12 EF 40% (previous 55% 01/2012).  NYHA II symptoms. He is back on Lasix 40 mg daily and does not appear volume overloaded.   - Continue current Lasix dose. Will need to be careful in the setting of sickle cell to make sure he does not get too dry.   - On goal coreg 25 mg twice a day.   - Continue hydralazine to 25 mg tid. Continue Imdur 30 mg daily. - He is not on Ace/Arb/spiro due to hyperkalemia/CKD - I will get an echo to reassess EF in 6/15.  - Reinforced daily weights, low salt food choices, and limiting fluid intake to , 2 liters perday 2. HTN: BP has been well-controlled.  3  Sickle Cell: Follows closely in sickle cell clinic.  4. Current smoker:  Declines smoking cessation.  Encouraged to stop smoking 5. CKD: Follow creatinine closely.  Most recent BMET with stable.  Needs a nephrologist.  We referred at last appointment but he has not been called yet.  Will place referral again.   Larey Dresser  10/29/2013   .

## 2013-10-30 ENCOUNTER — Telehealth (HOSPITAL_COMMUNITY): Payer: Self-pay

## 2013-10-30 NOTE — Telephone Encounter (Signed)
Labs reviewed with patient, instructed to hold lasix x 2 days, then resume as prescribed.  Aware and agreeable.

## 2013-11-16 ENCOUNTER — Encounter: Payer: Self-pay | Admitting: Internal Medicine

## 2013-12-01 ENCOUNTER — Ambulatory Visit (HOSPITAL_COMMUNITY)
Admission: RE | Admit: 2013-12-01 | Discharge: 2013-12-01 | Disposition: A | Discharge status: Home / Self Care | Payer: No Typology Code available for payment source | Source: Ambulatory Visit | Attending: Internal Medicine | Admitting: Internal Medicine

## 2013-12-01 ENCOUNTER — Ambulatory Visit (HOSPITAL_BASED_OUTPATIENT_CLINIC_OR_DEPARTMENT_OTHER)
Admission: RE | Admit: 2013-12-01 | Discharge: 2013-12-01 | Disposition: A | Discharge status: Home / Self Care | Payer: No Typology Code available for payment source | Source: Ambulatory Visit | Attending: Internal Medicine | Admitting: Internal Medicine

## 2013-12-01 VITALS — BP 130/70 | HR 73 | Wt 147.0 lb

## 2013-12-01 DIAGNOSIS — I059 Rheumatic mitral valve disease, unspecified: Secondary | ICD-10-CM

## 2013-12-01 DIAGNOSIS — I5022 Chronic systolic (congestive) heart failure: Secondary | ICD-10-CM

## 2013-12-01 DIAGNOSIS — I509 Heart failure, unspecified: Secondary | ICD-10-CM

## 2013-12-01 NOTE — Progress Notes (Signed)
Echocardiogram 2D Echocardiogram has been performed.  Patrick [name] 12/01/2013, 3:58 PM

## 2013-12-01 NOTE — Progress Notes (Signed)
Patient ID: Patrick [name], male   DOB: 21-Nov-1955, 58 y.o.   MRN: [id number]  Weight Range    145-148 pounds  Baseline proBNP   993.1 on 01/19/12  PCP: Patrick [name] : Patrick [name]  Opthalmologist: Patrick [name]  [employer]   HPI: Patrick [name] is a 58 y.o. African American male with a history of sickle [name] anemia, systolic HF due to NICM EF 40% 11/2012 initially diagnosed 05/2011, no CAD by cath 05/2011, CKD stage III, legally blind, and tobacco abuse.   Admitted 11/18/12 from HF clinic with voume overload. Hemoglobin 6.7 Transfused 2 UPRBC. Diuresed with IV lasix. Lisinopril stopped due to CKD. Discharge weight 143 pounds.    Admitted 02/07/13 through 02/12/13 after being treated for N/V/D, and abdominal pain. CT of abdomen- cholelithias with plans to f/u for elective cholecystectomy. Received 2UPRBC.  Hemoglobin 6.1>7.1.  Diuretics held due to dehydration. He restarted lasix 60 mg bid 02/13/13.   Admitted to [facility] 3/4 through 08/22/13 with anemia and volume overload. Hemglobin was 5.9. Received 1U PRBC with hemoglobin increased to 6.4 Diuresed with IV lasix. Discharged on carvedilol 25 mg twice a day, lasix 80 mg twice a day, hydralazine 25 mg tid, and imdur 30 mg daily.  D/C weight was 140 pounds.  He is not on Spironolactone due to hyperkalemia. He is not on ace inhibitor due to renal failure.  He returns for follow up. Denies SOB/PND/Orthopnea. Rides stationary bike 30 minuetes every other day. Weight at home 144-146 pounds. Now has talking scale. [facility] by [facility].   No orthopnea, PND, palpitations. Smoking 5 cigarettes a day.    01/17/12 ECHO 55%-60%  11/20/12 ECHO EF 40% Peak PA pressure 56 11/30/13 ECHO 40-45%   Labs        02/17/13: K+ 5.0, BUN 45, Creatinine 2.2, proBNP 10135       05/21/13 K 5.4 Creatinine 2.25 Pro BNP 3076        06/09/13 K 4.6 Creatinine 2.1 Pro BNP 10,727        07/07/13 Creatinine 2.06 K 5.0         08/19/13 Creatinine 2.5 Hemoglobin 5.9 Pro  BNP 8266         08/27/13 K 4.5 Creatinine 2.78 Hemoglobin 7.2        09/21/13 K 4.2, creatinine 2.56     SH: Lives with his cousin in [place]. Smokes 5 -6 cigarettes per day.   ROS: All systems negative except as listed in HPI, PMH and Problem List.  Past Medical History  Diagnosis Date  . Swelling of extremity, right   . Swelling abdomen   . Swelling of extremity, left   . CHF (congestive heart failure)   . Blood transfusion     "today is the second time I've had a blood transfusion" (11/21/2012)  . Sickle [name] anemia   . Blood dyscrasia   . Exertional shortness of breath   . Legally blind     Current Outpatient Prescriptions  Medication Sig Dispense Refill  . aspirin EC 81 MG tablet Take 81 mg by mouth daily.      . carvedilol (COREG) 25 MG tablet Take 25 mg by mouth 2 (two) times daily with a meal.      . folic acid (FOLVITE) 1 MG tablet Take 1 mg by mouth daily.      . furosemide (LASIX) 40 MG tablet Take 80 mg by mouth daily.      Marland Kitchen  hydrALAZINE (APRESOLINE) 50 MG tablet Take 75 mg by mouth 3 (three) times daily.      . isosorbide mononitrate (IMDUR) 30 MG 24 hr tablet Take 30 mg by mouth every morning.       No current facility-administered medications for this encounter.    Filed Vitals:   12/01/13 1514  BP: 130/70  Pulse: 73  Weight: 147 lb (66.679 kg)  SpO2: 97%    PHYSICAL EXAM: General:  Chronically ill appearing.  No resp difficulty  HEENT: normal except for mild scleral icterus Neck: supple. JVP 7. Carotids 2+ bilaterally; no bruits. No lymphadenopathy or thryomegaly appreciated. Cor: PMI normal Regular rate & rhythm. No rubs, gallops 2/6 TR and 2/6 MR Lungs: clear    Abdomen: soft, nontender, + distended. No hepatosplenomegaly. No bruits or masses. Good bowel sounds. Extremities: no cyanosis, rash,  edema ,  + clubbing. Neuro: alert & orientedx3, cranial nerves grossly intact. Moves all 4 extremities w/o difficulty. Affect pleasant.  ASSESSMENT &  PLAN: 1. Chronic Systolic Heart Failure:  NICM. Cath 05/2011 without significant coronary disease.  ECHO 11/20/12 EF 40% (previous 55% 01/2012).  NYHA II symptoms.  Volume status stable. Continue lasix 80 mg daily.    -  On goal coreg 25 mg twice a day.   - Continue hydralazine to 75 mg tid. Continue Imdur 30 mg daily. - He is not on Ace/Arb/spiro due to hyperkalemia/CKD - Reinforced daily weights, low salt food choices, and limiting fluid intake to , 2 liters perday 2. HTN:  Up a little but has not had medications today.  3  Sickle [name]: Follows closely in sickle [name] clinic.  4. Current smoker:  Declines smoking cessation.  Encouraged to stop smoking 5. CKD: Follow creatinine closely.  Most recent BMET with stable.  He has follow up with nephrology next month Needs a nephrologist.  .    Follow up in 3-4 months  Patrick [name]  12/01/2013   Patient seen and examined with Patrick Grinder, NP. We discussed all aspects of the encounter. I agree with the assessment and plan as stated above. He looks quite good today. Volume status ok. On good meds. Exercising. Echo reviewed personally and EF stable ~40-45%. Will continue current regimen. Reinforced need for daily weights and reviewed use of sliding scale diuretics.  Patrick Lutes,MD 6:08 PM    .

## 2013-12-01 NOTE — Patient Instructions (Signed)
Follow up in 3-4 months  Do the following things EVERYDAY: 1) Weigh yourself in the morning before breakfast. Write it down and keep it in a log. 2) Take your medicines as prescribed 3) Eat low salt foods-Limit salt (sodium) to 2000 mg per day.  4) Stay as active as you can everyday 5) Limit all fluids for the day to less than 2 liters 

## 2013-12-27 ENCOUNTER — Other Ambulatory Visit (HOSPITAL_COMMUNITY): Payer: Self-pay | Admitting: Adult Health

## 2013-12-28 ENCOUNTER — Other Ambulatory Visit (HOSPITAL_COMMUNITY): Payer: Self-pay

## 2014-01-04 ENCOUNTER — Telehealth: Payer: Self-pay | Admitting: Hematology

## 2014-01-04 ENCOUNTER — Other Ambulatory Visit: Payer: Self-pay | Admitting: Hematology

## 2014-01-04 NOTE — Telephone Encounter (Signed)
Spoke with patient and he will come in tomorrow for first injection.  Verified with pharmacy that Aranesp 100MCG is available

## 2014-01-05 ENCOUNTER — Non-Acute Institutional Stay (HOSPITAL_COMMUNITY)
Admission: AD | Admit: 2014-01-05 | Discharge: 2014-01-05 | Disposition: A | Discharge status: Home / Self Care | Payer: No Typology Code available for payment source | Source: Ambulatory Visit | Attending: Internal Medicine | Admitting: Internal Medicine

## 2014-01-05 DIAGNOSIS — N183 Chronic kidney disease, stage 3 unspecified: Secondary | ICD-10-CM | POA: Diagnosis present

## 2014-01-05 DIAGNOSIS — D631 Anemia in chronic kidney disease: Secondary | ICD-10-CM | POA: Diagnosis not present

## 2014-01-05 DIAGNOSIS — N039 Chronic nephritic syndrome with unspecified morphologic changes: Secondary | ICD-10-CM

## 2014-01-05 LAB — HEMOGLOBIN AND HEMATOCRIT, BLOOD
HCT: 19.6 % — ABNORMAL LOW (ref 39.0–52.0)
Hemoglobin: 7.4 g/dL — ABNORMAL LOW (ref 13.0–17.0)

## 2014-01-05 LAB — IRON AND TIBC
Iron: 135 ug/dL (ref 42–135)
Saturation Ratios: 78 % — ABNORMAL HIGH (ref 20–55)
TIBC: 172 ug/dL — ABNORMAL LOW (ref 215–435)
UIBC: 37 ug/dL — ABNORMAL LOW (ref 125–400)

## 2014-01-05 LAB — FERRITIN: Ferritin: 689 ng/mL — ABNORMAL HIGH (ref 22–322)

## 2014-01-05 MED ORDER — DARBEPOETIN ALFA-POLYSORBATE 100 MCG/0.5ML IJ SOLN
100.0000 ug | Freq: Once | INTRAMUSCULAR | Status: AC
  Administered 2014-01-05: 100 ug via SUBCUTANEOUS
  Filled 2014-01-05: qty 0.5

## 2014-01-05 NOTE — Discharge Instructions (Signed)
Darbepoetin Alfa injection What is this medicine? DARBEPOETIN ALFA (dar be POE e tin AL fa) helps your body make more red blood cells. It is used to treat anemia caused by chronic kidney failure and chemotherapy. This medicine may be used for other purposes; ask your health care provider or pharmacist if you have questions. COMMON BRAND NAME(S): Aranesp What should I tell my health care provider before I take this medicine? They need to know if you have any of these conditions: -blood clotting disorders or history of blood clots -cancer patient not on chemotherapy -cystic fibrosis -heart disease, such as angina, heart failure, or a history of a heart attack -hemoglobin level of 12 g/dL or greater -high blood pressure -low levels of folate, iron, or vitamin B12 -seizures -an unusual or allergic reaction to darbepoetin, erythropoietin, albumin, hamster proteins, latex, other medicines, foods, dyes, or preservatives -pregnant or trying to get pregnant -breast-feeding How should I use this medicine? This medicine is for injection into a vein or under the skin. It is usually given by a health care professional in a hospital or clinic setting. If you get this medicine at home, you will be taught how to prepare and give this medicine. Do not shake the solution before you withdraw a dose. Use exactly as directed. Take your medicine at regular intervals. Do not take your medicine more often than directed. It is important that you put your used needles and syringes in a special sharps container. Do not put them in a trash can. If you do not have a sharps container, call your pharmacist or healthcare provider to get one. Talk to your pediatrician regarding the use of this medicine in children. While this medicine may be used in children as young as 1 year for selected conditions, precautions do apply. Overdosage: If you think you have taken too much of this medicine contact a poison control center or  emergency room at once. NOTE: This medicine is only for you. Do not share this medicine with others. What if I miss a dose? If you miss a dose, take it as soon as you can. If it is almost time for your next dose, take only that dose. Do not take double or extra doses. What may interact with this medicine? Do not take this medicine with any of the following medications: -epoetin alfa This list may not describe all possible interactions. Give your health care provider a list of all the medicines, herbs, non-prescription drugs, or dietary supplements you use. Also tell them if you smoke, drink alcohol, or use illegal drugs. Some items may interact with your medicine. What should I watch for while using this medicine? Visit your prescriber or health care professional for regular checks on your progress and for the needed blood tests and blood pressure measurements. It is especially important for the doctor to make sure your hemoglobin level is in the desired range, to limit the risk of potential side effects and to give you the best benefit. Keep all appointments for any recommended tests. Check your blood pressure as directed. Ask your doctor what your blood pressure should be and when you should contact him or her. As your body makes more red blood cells, you may need to take iron, folic acid, or vitamin B supplements. Ask your doctor or health care provider which products are right for you. If you have kidney disease continue dietary restrictions, even though this medication can make you feel better. Talk with your doctor or health   care professional about the foods you eat and the vitamins that you take. What side effects may I notice from receiving this medicine? Side effects that you should report to your doctor or health care professional as soon as possible: -allergic reactions like skin rash, itching or hives, swelling of the face, lips, or tongue -breathing problems -changes in vision -chest  pain -confusion, trouble speaking or understanding -feeling faint or lightheaded, falls -high blood pressure -muscle aches or pains -pain, swelling, warmth in the leg -rapid weight gain -severe headaches -sudden numbness or weakness of the face, arm or leg -trouble walking, dizziness, loss of balance or coordination -seizures (convulsions) -swelling of the ankles, feet, hands -unusually weak or tired Side effects that usually do not require medical attention (report to your doctor or health care professional if they continue or are bothersome): -diarrhea -fever, chills (flu-like symptoms) -headaches -nausea, vomiting -redness, stinging, or swelling at site where injected This list may not describe all possible side effects. Call your doctor for medical advice about side effects. You may report side effects to FDA at 1-800-FDA-1088. Where should I keep my medicine? Keep out of the reach of children. Store in a refrigerator between 2 and 8 degrees C (36 and 46 degrees F). Do not freeze. Do not shake. Throw away any unused portion if using a single-dose vial. Throw away any unused medicine after the expiration date. NOTE: This sheet is a summary. It may not cover all possible information. If you have questions about this medicine, talk to your doctor, pharmacist, or health care provider.  2015, Elsevier/Gold Standard. (2008-05-18 10:23:57)  

## 2014-01-05 NOTE — Procedures (Signed)
[facility]  Procedure Note  KAIDENCE HOYLMAN [id number] DOB: 09-04-1955 DOA: 01/05/2014   PCP: MATTHEWS,MICHELLE A., MD   Associated Diagnosis: CKD Stage 3, Anemia of other chronic illness  Procedure Note: Vital signs taken. Peripheral Lab draw completed. Aranesp given.    Condition During Procedure: Patient tolerated well. No complaints.    Condition at Discharge: Patient tolerated, no complaints. Patient in no apparent distress. Patient education given. Patient acknowledges. Patient to make appointment for next month for aranesp injection.  Patient left day hospital with belongings ambulatory via self.    Wendie Simmer, RN  [facility]

## 2014-01-06 ENCOUNTER — Telehealth: Payer: Self-pay | Admitting: Internal Medicine

## 2014-01-06 NOTE — Telephone Encounter (Signed)
Crystal w/[facility] called to confirm that we are managing the patient's anemia and needs to know how often patient will be seen. Please call [place].

## 2014-01-06 NOTE — Telephone Encounter (Signed)
Dr. Zigmund Daniel, How often is patient to be seen for follow up on anemia? Last office Visit was 09/24/2013. Please advise. Thanks!

## 2014-01-06 NOTE — Telephone Encounter (Signed)
Left voicemail for patient to call to schedule appointment in one month .

## 2014-01-13 ENCOUNTER — Emergency Department (HOSPITAL_COMMUNITY): Payer: No Typology Code available for payment source

## 2014-01-13 ENCOUNTER — Observation Stay (HOSPITAL_COMMUNITY)
Admission: EM | Admit: 2014-01-13 | Discharge: 2014-01-14 | Disposition: A | Discharge status: Home / Self Care | Payer: No Typology Code available for payment source | Attending: Internal Medicine | Admitting: Internal Medicine

## 2014-01-13 ENCOUNTER — Encounter (HOSPITAL_COMMUNITY): Payer: Self-pay | Admitting: Emergency Medicine

## 2014-01-13 DIAGNOSIS — D649 Anemia, unspecified: Secondary | ICD-10-CM | POA: Diagnosis present

## 2014-01-13 DIAGNOSIS — I509 Heart failure, unspecified: Secondary | ICD-10-CM | POA: Diagnosis not present

## 2014-01-13 DIAGNOSIS — Z7982 Long term (current) use of aspirin: Secondary | ICD-10-CM | POA: Diagnosis not present

## 2014-01-13 DIAGNOSIS — Z720 Tobacco use: Secondary | ICD-10-CM | POA: Diagnosis present

## 2014-01-13 DIAGNOSIS — D571 Sickle-cell disease without crisis: Secondary | ICD-10-CM

## 2014-01-13 DIAGNOSIS — I10 Essential (primary) hypertension: Secondary | ICD-10-CM | POA: Diagnosis present

## 2014-01-13 DIAGNOSIS — R079 Chest pain, unspecified: Secondary | ICD-10-CM | POA: Diagnosis present

## 2014-01-13 DIAGNOSIS — I129 Hypertensive chronic kidney disease with stage 1 through stage 4 chronic kidney disease, or unspecified chronic kidney disease: Secondary | ICD-10-CM | POA: Insufficient documentation

## 2014-01-13 DIAGNOSIS — F17200 Nicotine dependence, unspecified, uncomplicated: Secondary | ICD-10-CM | POA: Insufficient documentation

## 2014-01-13 DIAGNOSIS — I5042 Chronic combined systolic (congestive) and diastolic (congestive) heart failure: Secondary | ICD-10-CM

## 2014-01-13 DIAGNOSIS — N184 Chronic kidney disease, stage 4 (severe): Secondary | ICD-10-CM | POA: Diagnosis present

## 2014-01-13 DIAGNOSIS — I5022 Chronic systolic (congestive) heart failure: Secondary | ICD-10-CM

## 2014-01-13 DIAGNOSIS — N183 Chronic kidney disease, stage 3 unspecified: Secondary | ICD-10-CM

## 2014-01-13 HISTORY — DX: Chronic kidney disease, stage 3 unspecified: N18.30

## 2014-01-13 HISTORY — DX: Sickle-cell disease without crisis: D57.1

## 2014-01-13 HISTORY — DX: Gout, unspecified: M10.9

## 2014-01-13 HISTORY — DX: Chronic kidney disease, stage 3 (moderate): N18.3

## 2014-01-13 LAB — CBC
HCT: 19.7 % — ABNORMAL LOW (ref 39.0–52.0)
Hemoglobin: 7.2 g/dL — ABNORMAL LOW (ref 13.0–17.0)
MCH: 33.8 pg (ref 26.0–34.0)
MCHC: 36.5 g/dL — ABNORMAL HIGH (ref 30.0–36.0)
MCV: 92.5 fL (ref 78.0–100.0)
Platelets: 152 10*3/uL (ref 150–400)
RBC: 2.13 MIL/uL — ABNORMAL LOW (ref 4.22–5.81)
RDW: 21.2 % — ABNORMAL HIGH (ref 11.5–15.5)
WBC: 9 10*3/uL (ref 4.0–10.5)

## 2014-01-13 LAB — TROPONIN I
Troponin I: <0.3 ng/mL (ref <0.30)
Troponin I: <0.3 ng/mL (ref <0.30)

## 2014-01-13 LAB — I-STAT TROPONIN, ED: Troponin i, poc: 0.01 ng/mL (ref 0.00–0.08)

## 2014-01-13 LAB — BASIC METABOLIC PANEL
Anion gap: 16 — ABNORMAL HIGH (ref 5–15)
BUN: 59 mg/dL — ABNORMAL HIGH (ref 6–23)
CO2: 20 mEq/L (ref 19–32)
Calcium: 8.4 mg/dL (ref 8.4–10.5)
Chloride: 100 mEq/L (ref 96–112)
Creatinine, Ser: 2.56 mg/dL — ABNORMAL HIGH (ref 0.50–1.35)
GFR calc Af Amer: 31 mL/min — ABNORMAL LOW (ref >90)
GFR calc non Af Amer: 27 mL/min — ABNORMAL LOW (ref >90)
Glucose, Bld: 111 mg/dL — ABNORMAL HIGH (ref 70–99)
Potassium: 4.9 mEq/L (ref 3.7–5.3)
Sodium: 136 mEq/L — ABNORMAL LOW (ref 137–147)

## 2014-01-13 LAB — RETICULOCYTES
RBC.: 2.39 MIL/uL — ABNORMAL LOW (ref 4.22–5.81)
Retic Count, Absolute: 351.3 10*3/uL — ABNORMAL HIGH (ref 19.0–186.0)
Retic Ct Pct: 14.7 % — ABNORMAL HIGH (ref 0.4–3.1)

## 2014-01-13 LAB — PRO B NATRIURETIC PEPTIDE: Pro B Natriuretic peptide (BNP): 1973 pg/mL — ABNORMAL HIGH (ref 0–125)

## 2014-01-13 MED ORDER — SODIUM CHLORIDE 0.9 % IJ SOLN
3.0000 mL | Freq: Two times a day (BID) | INTRAMUSCULAR | Status: DC
Start: 2014-01-13 — End: 2014-01-14

## 2014-01-13 MED ORDER — SODIUM CHLORIDE 0.9 % IV SOLN: 250.0000 mL | INTRAVENOUS | Status: DC | PRN

## 2014-01-13 MED ORDER — ONDANSETRON HCL 4 MG/2ML IJ SOLN: 4.0000 mg | Freq: Four times a day (QID) | INTRAMUSCULAR | Status: DC | PRN

## 2014-01-13 MED ORDER — FOLIC ACID 1 MG PO TABS
1.0000 mg | ORAL_TABLET | Freq: Every day | ORAL | Status: DC
  Administered 2014-01-14: 1 mg via ORAL
  Filled 2014-01-13: qty 1

## 2014-01-13 MED ORDER — HYDRALAZINE HCL 50 MG PO TABS
75.0000 mg | ORAL_TABLET | Freq: Three times a day (TID) | ORAL | Status: DC
  Administered 2014-01-13 – 2014-01-14 (×3): 75 mg via ORAL
  Filled 2014-01-13 (×5): qty 1

## 2014-01-13 MED ORDER — OXYCODONE HCL 5 MG PO TABS: 5.0000 mg | ORAL_TABLET | ORAL | Status: DC | PRN

## 2014-01-13 MED ORDER — ISOSORBIDE MONONITRATE ER 30 MG PO TB24
30.0000 mg | ORAL_TABLET | Freq: Every morning | ORAL | Status: DC
Start: 2014-01-14 — End: 2014-01-14
  Administered 2014-01-14: 30 mg via ORAL
  Filled 2014-01-13: qty 1

## 2014-01-13 MED ORDER — SODIUM CHLORIDE 0.9 % IJ SOLN
3.0000 mL | Freq: Two times a day (BID) | INTRAMUSCULAR | Status: DC
  Administered 2014-01-13 – 2014-01-14 (×2): 3 mL via INTRAVENOUS

## 2014-01-13 MED ORDER — ONDANSETRON HCL 4 MG PO TABS: 4.0000 mg | ORAL_TABLET | Freq: Four times a day (QID) | ORAL | Status: DC | PRN

## 2014-01-13 MED ORDER — HEPARIN SODIUM (PORCINE) 5000 UNIT/ML IJ SOLN
5000.0000 [IU] | Freq: Three times a day (TID) | INTRAMUSCULAR | Status: DC
Start: 2014-01-13 — End: 2014-01-14
  Filled 2014-01-13 (×3): qty 1

## 2014-01-13 MED ORDER — DOCUSATE SODIUM 100 MG PO CAPS
100.0000 mg | ORAL_CAPSULE | Freq: Two times a day (BID) | ORAL | Status: DC
  Administered 2014-01-13 – 2014-01-14 (×2): 100 mg via ORAL
  Filled 2014-01-13 (×3): qty 1

## 2014-01-13 MED ORDER — ACETAMINOPHEN 325 MG PO TABS: 650.0000 mg | ORAL_TABLET | Freq: Four times a day (QID) | ORAL | Status: DC | PRN

## 2014-01-13 MED ORDER — SODIUM CHLORIDE 0.9 % IV BOLUS (SEPSIS)
250.0000 mL | Freq: Once | INTRAVENOUS | Status: AC
  Administered 2014-01-13: 250 mL via INTRAVENOUS

## 2014-01-13 MED ORDER — HYDROMORPHONE HCL PF 1 MG/ML IJ SOLN: 0.5000 mg | INTRAMUSCULAR | Status: DC | PRN

## 2014-01-13 MED ORDER — CALCITRIOL 0.25 MCG PO CAPS
0.2500 ug | ORAL_CAPSULE | ORAL | Status: DC
  Filled 2014-01-13: qty 1

## 2014-01-13 MED ORDER — ALBUTEROL SULFATE (2.5 MG/3ML) 0.083% IN NEBU: 2.5000 mg | INHALATION_SOLUTION | RESPIRATORY_TRACT | Status: DC | PRN

## 2014-01-13 MED ORDER — FUROSEMIDE 80 MG PO TABS
80.0000 mg | ORAL_TABLET | Freq: Every day | ORAL | Status: DC
  Administered 2014-01-14: 80 mg via ORAL
  Filled 2014-01-13: qty 1

## 2014-01-13 MED ORDER — ACETAMINOPHEN 650 MG RE SUPP: 650.0000 mg | Freq: Four times a day (QID) | RECTAL | Status: DC | PRN

## 2014-01-13 MED ORDER — CARVEDILOL 25 MG PO TABS
25.0000 mg | ORAL_TABLET | Freq: Two times a day (BID) | ORAL | Status: DC
Start: 2014-01-13 — End: 2014-01-14
  Administered 2014-01-13 – 2014-01-14 (×2): 25 mg via ORAL
  Filled 2014-01-13 (×4): qty 1

## 2014-01-13 MED ORDER — SODIUM CHLORIDE 0.9 % IJ SOLN: 3.0000 mL | INTRAMUSCULAR | Status: DC | PRN

## 2014-01-13 MED ORDER — ASPIRIN EC 81 MG PO TBEC
81.0000 mg | DELAYED_RELEASE_TABLET | Freq: Every day | ORAL | Status: DC
  Administered 2014-01-14: 81 mg via ORAL
  Filled 2014-01-13: qty 1

## 2014-01-13 MED ORDER — NICOTINE 14 MG/24HR TD PT24
14.0000 mg | MEDICATED_PATCH | Freq: Every day | TRANSDERMAL | Status: DC
  Administered 2014-01-13 – 2014-01-14 (×2): 14 mg via TRANSDERMAL
  Filled 2014-01-13 (×2): qty 1

## 2014-01-13 MED ORDER — ASPIRIN 81 MG PO CHEW
162.0000 mg | CHEWABLE_TABLET | Freq: Once | ORAL | Status: AC
  Administered 2014-01-13: 162 mg via ORAL
  Filled 2014-01-13: qty 2

## 2014-01-13 NOTE — ED Notes (Signed)
Pt reports he took 81 mg of ASA today.

## 2014-01-13 NOTE — Telephone Encounter (Signed)
Patrick [name] need an appointment with me ASAP.

## 2014-01-13 NOTE — ED Notes (Signed)
Pt c/o central cp that started 20 mins ago, then started to feel weak and lightheaded. Denies diaphoresis/n/v. Hx of heart failure. Nad, skin warm and dry, resp

## 2014-01-13 NOTE — H&P (Signed)
[facility] History and Physical  MAXAMUS COLAO [id number] DOB: 05-Feb-1956 DOA: 01/13/2014   PCP: Leana Gamer., MD  Specialists: Patient is followed by Dr. Haroldine Laws with cardiology, Dr. Lorrene Reid with nephrology  Chief Complaint: Chest pain  HPI: Zadok D Lucchese is a 58 y.o. male with a past medical history of sickle cell disease, chronic kidney disease, CHF, who was in his usual state of health attending a dialysis education session when he suddenly felt weak and flushed. He started sweating. Has nausea and felt dizzy, and lightheaded and felt as if he was faint. This was followed by onset of left-sided chest pain, which was sharp, without radiation. It was on and off for about 20 minutes and then it resolved. The pain was 10 out of 10 in intensity. He denies any shortness of breath or palpitations with this episode. No vomiting. Did not feel like his usual sickle cell pain. Denies any fever or chills recently. No other illness recently. He took aspirin and was brought into the emergency department and by then his pain had almost resolved. He said he had a similar episode previously without chest pain. He has had a cardiac catheterization in December of 2012, which showed clean coronaries. He had an echocardiogram recently in June, which showed his stable congestive heart failure.  Home Medications: Prior to Admission medications   Medication Sig Start Date End Date Taking? Authorizing Provider  aspirin EC 81 MG tablet Take 81 mg by mouth daily.   Yes Historical Provider, MD  calcitRIOL (ROCALTROL) 0.25 MCG capsule Take 0.25 mcg by mouth 3 (three) times a week. Monday, Wednesday and Friday   Yes Historical Provider, MD  carvedilol (COREG) 25 MG tablet Take 25 mg by mouth 2 (two) times daily with a meal.   Yes Historical Provider, MD  folic acid (FOLVITE) 1 MG tablet Take 1 mg by mouth daily.   Yes Historical Provider, MD  furosemide (LASIX) 40 MG tablet Take 80 mg by  mouth daily. 10/28/13  Yes Larey Dresser, MD  hydrALAZINE (APRESOLINE) 50 MG tablet Take 75 mg by mouth 3 (three) times daily.   Yes Historical Provider, MD  isosorbide mononitrate (IMDUR) 30 MG 24 hr tablet Take 30 mg by mouth every morning.   Yes Historical Provider, MD    Allergies: No Known Allergies  Past Medical History: Past Medical History  Diagnosis Date  . Swelling of extremity, right   . Swelling abdomen   . Swelling of extremity, left   . CHF (congestive heart failure)   . Blood transfusion     "today is the second time I've had a blood transfusion" (11/21/2012)  . Sickle cell anemia   . Blood dyscrasia   . Exertional shortness of breath   . Legally blind     Past Surgical History  Procedure Laterality Date  . No past surgeries      Social History: He lives in [place] with his cousin. Smokes 7-8 cigarettes a day. Denies any illicit drug use. Occasional beer consumption. Independent with daily activities.  Family History:  Family History  Problem Relation Age of Onset  . Alcohol abuse Mother   . Liver disease Mother   . Cirrhosis Mother    his grandmother had stroke.  Review of Systems - History obtained from the patient General ROS: positive for  - fatigue Psychological ROS: negative Ophthalmic ROS: legally blind ENT ROS: negative Allergy and Immunology ROS: negative Hematological and Lymphatic ROS: negative Endocrine ROS: negative Respiratory ROS:  as in hpi Cardiovascular ROS: as in hpi Gastrointestinal ROS: no abdominal pain, change in bowel habits, or black or bloody stools Genito-Urinary ROS: no dysuria, trouble voiding, or hematuria Musculoskeletal ROS: negative Neurological ROS: no TIA or stroke symptoms Dermatological ROS: negative  Physical Examination  Filed Vitals:   01/13/14 1307 01/13/14 1400 01/13/14 1440 01/13/14 1500  BP: 131/81 121/80 134/73 135/77  Pulse: 66 66 81 69  Temp:      TempSrc:      Resp:  _0 Height:        Weight:      SpO2: 98% 96% 97% 96%    BP 135/77  Pulse 69  Temp(Src) 97.7 F (36.5 C) (Oral)  Resp 11  Ht _1  (1.803 m)  Wt 65.137 kg (143 lb 9.6 oz)  BMI 20.04 kg/m2  SpO2 96%  General appearance: alert, cooperative, appears stated age and no distress Head: Normocephalic, without obvious abnormality, atraumatic Eyes: conjunctivae/corneas clear. PERRL, EOM's intact.  Throat: lips, mucosa, and tongue normal; teeth and gums normal Neck: no adenopathy, no carotid bruit, no JVD, supple, symmetrical, trachea midline and thyroid not enlarged, symmetric, no tenderness/mass/nodules Resp: clear to auscultation bilaterally Chest wall: no tenderness Cardio: regular rate and rhythm, S1, S2 normal, no murmur, click, rub or gallop GI: soft, non-tender; bowel sounds normal; no masses,  no organomegaly Extremities: extremities normal, atraumatic, no cyanosis or edema Pulses: 2+ and symmetric Skin: Skin color, texture, turgor normal. No rashes or lesions Neurologic: Alert and oriented X 3. No focal deficits.  Laboratory Data: Results for orders placed during the hospital encounter of 01/13/14 (from the past 48 hour(s))  CBC     Status: Abnormal   Collection Time    01/13/14 11:34 AM      Result Value Ref Range   WBC 9.0  4.0 - 10.5 K/uL   Comment: WHITE COUNT CONFIRMED ON SMEAR   RBC 2.13 (*) 4.22 - 5.81 MIL/uL   Hemoglobin 7.2 (*) 13.0 - 17.0 g/dL   HCT 19.7 (*) 39.0 - 52.0 %   MCV 92.5  78.0 - 100.0 fL   MCH 33.8  26.0 - 34.0 pg   MCHC 36.5 (*) 30.0 - 36.0 g/dL   RDW 21.2 (*) 11.5 - 15.5 %   Platelets 152  150 - 400 K/uL   Comment: PLATELET COUNT CONFIRMED BY SMEAR  BASIC METABOLIC PANEL     Status: Abnormal   Collection Time    01/13/14 11:34 AM      Result Value Ref Range   Sodium 136 (*) 137 - 147 mEq/L   Potassium 4.9  3.7 - 5.3 mEq/L   Chloride 100  96 - 112 mEq/L   CO2 20  19 - 32 mEq/L   Glucose, Bld 111 (*) 70 - 99 mg/dL   BUN 59 (*) 6 - 23 mg/dL   Creatinine, Ser  2.56 (*) 0.50 - 1.35 mg/dL   Calcium 8.4  8.4 - 10.5 mg/dL   GFR calc non Af Amer 27 (*) >90 mL/min   GFR calc Af Amer 31 (*) >90 mL/min   Comment: (NOTE)     The eGFR has been calculated using the CKD EPI equation.     This calculation has not been validated in all clinical situations.     eGFR's persistently <90 mL/min signify possible Chronic Kidney     Disease.   Anion gap 16 (*) 5 - 15  I-STAT TROPOININ, ED     Status:  None   Collection Time    01/13/14 11:42 AM      Result Value Ref Range   Troponin i, poc 0.01  0.00 - 0.08 ng/mL   Comment 3            Comment: Due to the release kinetics of cTnI,     a negative result within the first hours     of the onset of symptoms does not rule out     myocardial infarction with certainty.     If myocardial infarction is still suspected,     repeat the test at appropriate intervals.  RETICULOCYTES     Status: Abnormal   Collection Time    01/13/14  1:23 PM      Result Value Ref Range   Retic Ct Pct 14.7 (*) 0.4 - 3.1 %   RBC. 2.39 (*) 4.22 - 5.81 MIL/uL   Retic Count, Manual 351.3 (*) 19.0 - 186.0 K/uL  PRO B NATRIURETIC PEPTIDE     Status: Abnormal   Collection Time    01/13/14  1:23 PM      Result Value Ref Range   Pro B Natriuretic peptide (BNP) 1973.0 (*) 0 - 125 pg/mL    Radiology Reports: Dg Chest 2 View  01/13/2014   CLINICAL DATA:  Chest pain.  Weakness.  EXAM: CHEST  2 VIEW  COMPARISON:  08/19/2013  FINDINGS: The heart is mildly enlarged. Mediastinal shadows are normal. There may be mild venous hypertension but there is no edema. No infiltrate, collapse or effusion. Bony structures are unremarkable.  IMPRESSION: Mild cardiac enlargement. Possible venous hypertension. No edema or focal abnormality.   Electronically Signed   By: Nelson Chimes M.D.   On: 01/13/2014 12:25    Electrocardiogram: Sinus rhythm at 61 beats per minute. Normal axis. First-degree AV block. No Q waves. No concerning ST or T-wave changes are  noted.  Problem List  Principal Problem:   Chest pain Active Problems:   HTN (hypertension)   Chronic diastolic heart failure   Chronic systolic heart failure   Tobacco abuse   Sickle cell anemia   CKD (chronic kidney disease), stage III   Anemia   Assessment: This is a 58 year old, African American male, who presents with chest pain. Differential diagnoses include angina, pain related to sickle cell disease, GERD, musculoskeletal. Unlikely to be venous thromboembolism as his pain had resolved after 20 minutes.  Plan: #1 chest pain: He'll be observed overnight. Troponin will be obtained serially. Continue aspirin. He'll be kept n.p.o. past midnight in case a stress test is planned in the morning. He follows closely with Dr. Haroldine Laws so potentially he can be evaluated as an outpatient as well. Had a recent echocardiogram so no need to repeat one either.  #2 history of biventricular congestive heart failure: His EF is known to be about 40%. This is based on echocardiogram done in June of this year. He is well compensated. He is close to his dry weight. Continue with his current medications.  #3 chronic kidney disease, stage III: creatinine appears to be close to baseline. Continue to monitor.  #4 history of sickle cell disease with anemia: Hemoglobin is at baseline. Reticulocytes are elevated. However, patient does not have any crises related pain. Continue to monitor.  #5 history of hypertension: Continue with his home medications.  #6 history of tobacco abuse: Counseling was provided. Nicotine patch will be prescribed.   DVT Prophylaxis: Heparin Code Status: Full code Family Communication: Discussed with  the patient  Disposition Plan: Observe on telemetry   Further management decisions will depend on results of further testing and patient's response to treatment.   [facility]  [facility] Pager [phone]  If 7PM-7AM, please contact  night-coverage [email or web address] Password [facility]  01/13/2014, 3:13 PM  Disclaimer: This note was dictated with voice recognition software. Similar sounding words can inadvertently be transcribed and may not be corrected upon review.

## 2014-01-13 NOTE — ED Provider Notes (Signed)
CSN: [id number]     Arrival date & time 01/13/14  1117 History   First MD Initiated Contact with Patient 01/13/14 1308     Chief Complaint  Patient presents with  . Chest Pain     (Consider location/radiation/quality/duration/timing/severity/associated sxs/prior Treatment) The history is provided by the patient. No language interpreter was used.  Patrick [name] is a 58 year old male past history of sickle cell disease, chronic systolic congestive heart failure, legally blind presenting to the ED with chest pain that started earlier this afternoon while patient was in an educational session. Patient reported that while resting he started to develop left-sided chest pain described as a sharp shooting pain without radiation that lasted approximately 20 minutes. Patient reported that he felt nauseous as well as lightheaded during this episode. Stated that he has never experienced pain like this before. Patient reported that he felt as if he was going to faint, but did not. Patient reports that he takes aspirin 81 mg daily. Denied shortness of breath, difficulty breathing, numbness, tingling, blurred vision, sudden loss of vision, neck pain, neck stiffness, fever, chills, diarrhea, hematochezia, abdominal pain, diaphoresis, leg swelling, travel, hematemesis, syncope. PCP Dr. Zigmund Daniel   Past Medical History  Diagnosis Date  . Swelling of extremity, right   . Swelling abdomen   . Swelling of extremity, left   . CHF (congestive heart failure)   . Blood transfusion     "today is the second time I've had a blood transfusion" (11/21/2012)  . Sickle cell anemia   . Blood dyscrasia   . Exertional shortness of breath   . Legally blind    Past Surgical History  Procedure Laterality Date  . No past surgeries     Family History  Problem Relation Age of Onset  . Alcohol abuse Mother   . Liver disease Mother   . Cirrhosis Mother    History  Substance Use Topics  . Smoking status: Current Every  Day Smoker -- 0.25 packs/day for 40 years    Types: Cigarettes  . Smokeless tobacco: Never Used  . Alcohol Use: 0.0 oz/week     Comment: occassionally still drinks beer    Review of Systems  Constitutional: Negative for fever and chills.  Respiratory: Negative for cough and shortness of breath.   Cardiovascular: Positive for chest pain.  Gastrointestinal: Positive for nausea. Negative for vomiting.  Musculoskeletal: Negative for back pain and neck pain.  Neurological: Positive for weakness and light-headedness. Negative for dizziness.      Allergies  Review of patient's allergies indicates no known allergies.  Home Medications   Prior to Admission medications   Medication Sig Start Date End Date Taking? Authorizing Provider  aspirin EC 81 MG tablet Take 81 mg by mouth daily.   Yes Historical Provider, MD  calcitRIOL (ROCALTROL) 0.25 MCG capsule Take 0.25 mcg by mouth 3 (three) times a week. Monday, Wednesday and Friday   Yes Historical Provider, MD  carvedilol (COREG) 25 MG tablet Take 25 mg by mouth 2 (two) times daily with a meal.   Yes Historical Provider, MD  folic acid (FOLVITE) 1 MG tablet Take 1 mg by mouth daily.   Yes Historical Provider, MD  furosemide (LASIX) 40 MG tablet Take 80 mg by mouth daily. 10/28/13  Yes Larey Dresser, MD  hydrALAZINE (APRESOLINE) 50 MG tablet Take 75 mg by mouth 3 (three) times daily.   Yes Historical Provider, MD  isosorbide mononitrate (IMDUR) 30 MG 24 hr tablet Take 30 mg  by mouth every morning.   Yes Historical Provider, MD   BP 134/73  Pulse 81  Temp(Src) 97.7 F (36.5 C) (Oral)  Resp 15  Ht 5\' 11"  (1.803 m)  Wt 143 lb 9.6 oz (65.137 kg)  BMI 20.04 kg/m2  SpO2 97% Physical Exam  Nursing note and vitals reviewed. Constitutional: He is oriented to person, place, and time. He appears well-developed and well-nourished. No distress.  HENT:  Head: Normocephalic and atraumatic.  Mouth/Throat: Oropharynx is clear and moist. No  oropharyngeal exudate.  Eyes: Conjunctivae and EOM are normal. Pupils are equal, round, and reactive to light. Right eye exhibits no discharge. Left eye exhibits no discharge.  Neck: Normal range of motion. Neck supple. No tracheal deviation present.  Cardiovascular: Normal rate, regular rhythm and normal heart sounds.  Exam reveals no friction rub.   No murmur heard. Pulses:      Radial pulses are 2+ on the right side, and 2+ on the left side.       Dorsalis pedis pulses are 2+ on the right side, and 2+ on the left side.       Posterior tibial pulses are 2+ on the right side, and 2+ on the left side.  Cap refill less than 3 seconds Negative swelling or pitting edema identified to the lower extremities bilaterally  Pulmonary/Chest: Effort normal and breath sounds normal. No respiratory distress. He has no wheezes. He has no rales. He exhibits no tenderness.  Patient is able to speak in full sentences without difficulty Negative use of accessory muscles Negative stridor Negative pain upon palpation to the chest wall-negative crepitus  Musculoskeletal: Normal range of motion.  Full ROM to upper and lower extremities without difficulty noted, negative ataxia noted.  Lymphadenopathy:    He has no cervical adenopathy.  Neurological: He is alert and oriented to person, place, and time. No cranial nerve deficit. He exhibits normal muscle tone. Coordination normal.  Cranial nerves III-XII grossly intact Strength 5+/5+ to upper and lower extremities bilaterally with resistance applied, equal distribution noted Equal grip strength bilaterally Negative facial droop Negative slurred speech Negative aphasia  Skin: Skin is warm and dry. No rash noted. He is not diaphoretic. No erythema.  Psychiatric: He has a normal mood and affect. His behavior is normal. Thought content normal.    ED Course  Procedures (including critical care time)  Results for orders placed during the hospital encounter of  01/13/14  CBC      Result Value Ref Range   WBC 9.0  4.0 - 10.5 K/uL   RBC 2.13 (*) 4.22 - 5.81 MIL/uL   Hemoglobin 7.2 (*) 13.0 - 17.0 g/dL   HCT 19.7 (*) 39.0 - 52.0 %   MCV 92.5  78.0 - 100.0 fL   MCH 33.8  26.0 - 34.0 pg   MCHC 36.5 (*) 30.0 - 36.0 g/dL   RDW 21.2 (*) 11.5 - 15.5 %   Platelets 152  150 - 400 K/uL  BASIC METABOLIC PANEL      Result Value Ref Range   Sodium 136 (*) 137 - 147 mEq/L   Potassium 4.9  3.7 - 5.3 mEq/L   Chloride 100  96 - 112 mEq/L   CO2 20  19 - 32 mEq/L   Glucose, Bld 111 (*) 70 - 99 mg/dL   BUN 59 (*) 6 - 23 mg/dL   Creatinine, Ser 2.56 (*) 0.50 - 1.35 mg/dL   Calcium 8.4  8.4 - 10.5 mg/dL   GFR  calc non Af Amer 27 (*) >90 mL/min   GFR calc Af Amer 31 (*) >90 mL/min   Anion gap 16 (*) 5 - 15  RETICULOCYTES      Result Value Ref Range   Retic Ct Pct 14.7 (*) 0.4 - 3.1 %   RBC. 2.39 (*) 4.22 - 5.81 MIL/uL   Retic Count, Manual 351.3 (*) 19.0 - 186.0 K/uL  PRO B NATRIURETIC PEPTIDE      Result Value Ref Range   Pro B Natriuretic peptide (BNP) 1973.0 (*) 0 - 125 pg/mL  I-STAT TROPOININ, ED      Result Value Ref Range   Troponin i, poc 0.01  0.00 - 0.08 ng/mL   Comment 3             Labs Review Labs Reviewed  CBC - Abnormal; Notable for the following:    RBC 2.13 (*)    Hemoglobin 7.2 (*)    HCT 19.7 (*)    MCHC 36.5 (*)    RDW 21.2 (*)    All other components within normal limits  BASIC METABOLIC PANEL - Abnormal; Notable for the following:    Sodium 136 (*)    Glucose, Bld 111 (*)    BUN 59 (*)    Creatinine, Ser 2.56 (*)    GFR calc non Af Amer 27 (*)    GFR calc Af Amer 31 (*)    Anion gap 16 (*)    All other components within normal limits  RETICULOCYTES - Abnormal; Notable for the following:    Retic Ct Pct 14.7 (*)    RBC. 2.39 (*)    Retic Count, Manual 351.3 (*)    All other components within normal limits  PRO B NATRIURETIC PEPTIDE - Abnormal; Notable for the following:    Pro B Natriuretic peptide (BNP) 1973.0 (*)     All other components within normal limits  I-STAT TROPOININ, ED    Imaging Review Dg Chest 2 View  01/13/2014   CLINICAL DATA:  Chest pain.  Weakness.  EXAM: CHEST  2 VIEW  COMPARISON:  08/19/2013  FINDINGS: The heart is mildly enlarged. Mediastinal shadows are normal. There may be mild venous hypertension but there is no edema. No infiltrate, collapse or effusion. Bony structures are unremarkable.  IMPRESSION: Mild cardiac enlargement. Possible venous hypertension. No edema or focal abnormality.   Electronically Signed   By: Nelson Chimes M.D.   On: 01/13/2014 12:25     EKG Interpretation   Date/Time:  Wednesday January 13 2014 11:24:55 EDT Ventricular Rate:  61 PR Interval:  230 QRS Duration: 94 QT Interval:  470 QTC Calculation: 473 R Axis:   66 Text Interpretation:  Sinus rhythm with 1st degree A-V block Minimal  voltage criteria for LVH, may be normal variant Nonspecific ST abnormality  Abnormal ECG Similar to prior Confirmed by Mingo Amber  MD, [place] ([id number]) on  01/13/2014 1:03:50 PM      MDM   Final diagnoses:  Chest pain, unspecified chest pain type  Sickle cell anemia without crisis  Chronic systolic congestive heart failure    Medications  aspirin chewable tablet 162 mg (162 mg Oral Given 01/13/14 1426)  sodium chloride 0.9 % bolus 250 mL (0 mLs Intravenous Stopped 01/13/14 1442)   Filed Vitals:   01/13/14 1124 01/13/14 1307 01/13/14 1400 01/13/14 1440  BP: 108/68 131/81 121/80 134/73  Pulse: 59 66 66 81  Temp: 97.7 F (36.5 C)     TempSrc: Oral  Resp: 18  12 15   Height: 5\' 11"  (1.803 m)     Weight: 143 lb 9.6 oz (65.137 kg)     SpO2: 95% 98% 96% 97%   EKG noted sinus rhythm with heart rate of 61 bpm with 1st degree A-V block similar to prior. I-STAT troponin negative elevation. CBC negative elevated white blood cell count. RBC 2.13. Hemoglobin 7.2-appears to be normal levels of hemoglobin-patient's history of sickle cell anemia. Hematocrit 19.7. Reticulocytes  351.3. BNP elevated at 1973.0. BMP noted elevated BUN of 59, creatinine of 2.56. Mildly elevated glucose of 111 with anion gap of 16.0 mEq per liter. Chest x-ray noted mild cardiac enlargement, possible venous hypertension with no edema or focal abnormalities. No pleural effusion noted. No collapse or infiltrate identified. Patient given aspirin while in ED setting, patient takes baby aspirin daily. Patient presenting to the ED with chest pain, nonspecific. Patient has history of chronic systolic congestive heart failure, is being followed by Dr. Sung Amabile. HEART score of 4. Patient seen and assessed by attending physician who agreed to admission for cardiac rule out. Doubt acute chest syndrome. Doubt fluid overload, doubt CHF exacerbation. This provider spoke with  [facility] - patient to be admitted to the hospital. Patient agreed to plan of care. Patient stable for transfer.    Jamse Mead, PA-C 01/13/14 1615

## 2014-01-13 NOTE — ED Notes (Signed)
Attempted report 

## 2014-01-13 NOTE — Telephone Encounter (Signed)
Can you please schedule patient and appointment with Dr. Zigmund Daniel? Thanks!

## 2014-01-14 DIAGNOSIS — I5022 Chronic systolic (congestive) heart failure: Secondary | ICD-10-CM

## 2014-01-14 LAB — BASIC METABOLIC PANEL
Anion gap: 17 — ABNORMAL HIGH (ref 5–15)
BUN: 62 mg/dL — ABNORMAL HIGH (ref 6–23)
CO2: 18 mEq/L — ABNORMAL LOW (ref 19–32)
Calcium: 8.7 mg/dL (ref 8.4–10.5)
Chloride: 105 mEq/L (ref 96–112)
Creatinine, Ser: 2.29 mg/dL — ABNORMAL HIGH (ref 0.50–1.35)
GFR calc Af Amer: 35 mL/min — ABNORMAL LOW (ref >90)
GFR calc non Af Amer: 30 mL/min — ABNORMAL LOW (ref >90)
Glucose, Bld: 92 mg/dL (ref 70–99)
Potassium: 4.7 mEq/L (ref 3.7–5.3)
Sodium: 140 mEq/L (ref 137–147)

## 2014-01-14 LAB — CBC
HCT: 18.9 % — ABNORMAL LOW (ref 39.0–52.0)
Hemoglobin: 7 g/dL — ABNORMAL LOW (ref 13.0–17.0)
MCH: 33.8 pg (ref 26.0–34.0)
MCHC: 36.5 g/dL — ABNORMAL HIGH (ref 30.0–36.0)
MCV: 92.6 fL (ref 78.0–100.0)
Platelets: 156 10*3/uL (ref 150–400)
RBC: 2.04 MIL/uL — ABNORMAL LOW (ref 4.22–5.81)
RDW: 21.1 % — ABNORMAL HIGH (ref 11.5–15.5)
WBC: 9.7 10*3/uL (ref 4.0–10.5)

## 2014-01-14 LAB — TROPONIN I: Troponin I: <0.3 ng/mL (ref <0.30)

## 2014-01-14 NOTE — Progress Notes (Signed)
All d/c instructions explained as given to pt.  Verbalized understanding.  Pt/c off floor via ambulatory.  Decline w/c service.  Karie Kirks, Therapist, sports.

## 2014-01-14 NOTE — Progress Notes (Signed)
Utilization Review Completed.Donne Anon T7/30/2015

## 2014-01-14 NOTE — ED Provider Notes (Signed)
Medical screening examination/treatment/procedure(s) were conducted as a shared visit with non-physician practitioner(s) and myself.  I personally evaluated the patient during the encounter.   EKG Interpretation   Date/Time:  Wednesday January 13 2014 11:24:55 EDT Ventricular Rate:  61 PR Interval:  230 QRS Duration: 94 QT Interval:  470 QTC Calculation: 473 R Axis:   66 Text Interpretation:  Sinus rhythm with 1st degree A-V block Minimal  voltage criteria for LVH, may be normal variant Nonspecific ST abnormality  Abnormal ECG Similar to prior Confirmed by Mingo Amber  MD, [place] ([id number]) on  01/13/2014 1:03:50 PM      33s male here with chest pain. 20 minute episode while at work, CP free now. Hx of heart failure, followed by Dr. Haroldine Laws. Labs ok - showing baseline anemia. Patient admitted for ACS r/o.  Osvaldo Shipper, MD 01/14/14 [phone]

## 2014-01-14 NOTE — Discharge Summary (Signed)
[facility]  Physician Discharge Summary   Patient ID: Patrick [name] MRN: [id number] DOB/AGE: 1955/09/28 58 y.o.  Admit date: 01/13/2014 Discharge date: 01/14/2014  PCP: MATTHEWS,MICHELLE A., MD  DISCHARGE DIAGNOSES:  Principal Problem:   Chest pain Active Problems:   HTN (hypertension)   Chronic diastolic heart failure   Chronic systolic heart failure   Tobacco abuse   Sickle cell anemia   CKD (chronic kidney disease), stage III   Anemia   RECOMMENDATIONS FOR OUTPATIENT FOLLOW UP: 1. Close follow up with cardiology has been arranged  DISCHARGE CONDITION: fair  Diet recommendation: Renal Diet  Filed Weights   01/13/14 1124 01/13/14 1628 01/14/14 0654  Weight: 65.137 kg (143 lb 9.6 oz) 62.5 kg (137 lb 12.6 oz) 63.776 kg (140 lb 9.6 oz)    INITIAL HISTORY: 58yo male with SCD, biventricular heart failure and CKD presented with chest pain which resolved after 20 minutes. He was observed to rule out ACS.  Consultations:  None  Procedures:  None  HOSPITAL COURSE:   Chest pain No recurrence of pain. He feels well this morning. Troponins are normal. He will follow up with his cardiologist. He should continue aspirin. He agrees with this plan.   History of Biventricular Congestive Heart Failure His EF is known to be about 40%. This is based on echocardiogram done in June of this year. He is well compensated. He is close to his dry weight. Continue with his current medications.   Chronic Kidney Disease, stage III Creatinine appears to be close to baseline. He can follow up with his Nephrologist.   History of Sickle Cell Disease with Anemia Hemoglobin is at baseline. Reticulocytes are elevated. However, patient does not have any crises related pain. Follow up with Dr. Zigmund Daniel.  History of Hypertension Continue with his home medications.   Patient denies any further chest pain and work up has been negative. He is stable for discharge.   PERTINENT  LABS:  The results of significant diagnostics from this hospitalization (including imaging, microbiology, ancillary and laboratory) are listed below for reference.     Labs: Basic Metabolic Panel:  Recent Labs Lab 01/13/14 1134 01/14/14 0506  NA 136* 140  K 4.9 4.7  CL 100 105  CO2 20 18*  GLUCOSE 111* 92  BUN 59* 62*  CREATININE 2.56* 2.29*  CALCIUM 8.4 8.7   CBC:  Recent Labs Lab 01/13/14 1134 01/14/14 0506  WBC 9.0 9.7  HGB 7.2* 7.0*  HCT 19.7* 18.9*  MCV 92.5 92.6  PLT 152 156   Cardiac Enzymes:  Recent Labs Lab 01/13/14 1655 01/13/14 2250 01/14/14 0506  TROPONINI <0.30 <0.30 <0.30   BNP: BNP (last 3 results)  Recent Labs  08/19/13 1825 01/13/14 1323  PROBNP 8266.0* 1973.0*    IMAGING STUDIES Dg Chest 2 View  01/13/2014   CLINICAL DATA:  Chest pain.  Weakness.  EXAM: CHEST  2 VIEW  COMPARISON:  08/19/2013  FINDINGS: The heart is mildly enlarged. Mediastinal shadows are normal. There may be mild venous hypertension but there is no edema. No infiltrate, collapse or effusion. Bony structures are unremarkable.  IMPRESSION: Mild cardiac enlargement. Possible venous hypertension. No edema or focal abnormality.   Electronically Signed   By: Nelson Chimes M.D.   On: 01/13/2014 12:25    DISCHARGE EXAMINATION: Filed Vitals:   01/13/14 2012 01/14/14 0217 01/14/14 0654 01/14/14 1024  BP: 113/64 120/66 123/74 129/73  Pulse: 82 86 77   Temp: 98.1 F (36.7 C) 98.1 F (36.7  C) 98.1 F (36.7 C)   TempSrc: Oral Oral Oral   Resp: 15 16 16    Height:      Weight:   63.776 kg (140 lb 9.6 oz)   SpO2: 95% 95% 94%    General appearance: alert, cooperative, appears stated age and no distress Resp: clear to auscultation bilaterally Cardio: regular rate and rhythm, S1, S2 normal, no murmur, click, rub or gallop GI: soft, non-tender; bowel sounds normal; no masses,  no organomegaly Neurologic: Alert and oriented X 3, normal strength and tone. Normal symmetric  reflexes. Normal coordination and gait  DISPOSITION: Home  Discharge Instructions   Discharge diet:    Complete by:  As directed   Renal Diet     Discharge instructions    Complete by:  As directed   You need to follow up with Dr. Haroldine Laws in 2 weeks. Appointment is made on 01/28/14 at 11:45Am.     Increase activity slowly    Complete by:  As directed            ALLERGIES: No Known Allergies  Current Discharge Medication List    CONTINUE these medications which have NOT CHANGED   Details  aspirin EC 81 MG tablet Take 81 mg by mouth daily.    calcitRIOL (ROCALTROL) 0.25 MCG capsule Take 0.25 mcg by mouth 3 (three) times a week. Monday, Wednesday and Friday    carvedilol (COREG) 25 MG tablet Take 25 mg by mouth 2 (two) times daily with a meal.    folic acid (FOLVITE) 1 MG tablet Take 1 mg by mouth daily.    furosemide (LASIX) 40 MG tablet Take 80 mg by mouth daily.    hydrALAZINE (APRESOLINE) 50 MG tablet Take 75 mg by mouth 3 (three) times daily.    isosorbide mononitrate (IMDUR) 30 MG 24 hr tablet Take 30 mg by mouth every morning.       Follow-up Information   Call MATTHEWS,MICHELLE A., MD. (As needed)    Specialty:  Internal Medicine   Contact information:   [place] [phone] [phone]       Follow up with Glori Bickers, MD On 01/28/2014. (at 11:45AM)    Specialty:  Cardiology   Contact information:   [address] [place] [address] [phone]       TOTAL DISCHARGE TIME: [facility] Pager [phone]  01/14/2014, 10:27 AM  Disclaimer: This note was dictated with voice recognition software. Similar sounding words can inadvertently be transcribed and may not be corrected upon review.

## 2014-01-14 NOTE — Discharge Instructions (Signed)

## 2014-01-14 NOTE — Care Management Note (Addendum)
  Page 1 of 1   01/14/2014     3:42:08 PM CARE MANAGEMENT NOTE 01/14/2014  Patient:  Patrick [name], Patrick [name]   Account Number:  [id number]  Date Initiated:  01/14/2014  Documentation initiated by:  Loran Auguste  Subjective/Objective Assessment:   CP, dizzy, Cardiac hx, legally blind     Action/Plan:   CM to follow for disposition needs   Anticipated DC Date:  01/17/2014   Anticipated DC Plan:  HOME/SELF CARE         Choice offered to / List presented to:             Status of service:  Completed, signed off Medicare Important Message given?   (If response is "NO", the following Medicare IM given date fields will be blank) Date Medicare IM given:   Medicare IM given by:   Date Additional Medicare IM given:   Additional Medicare IM given by:    Discharge Disposition:  HOME/SELF CARE  Per UR Regulation:    If discussed at Long Length of Stay Meetings, dates discussed:    Comments:  Bruna Dills RN, BSN, MSHL, CCM  Nurse - Case Manager,  (Unit [place])  [phone]  01/14/2014 Observation Adm:  CP, dizzy, Cardiac hx Social:   legally blind Disposition Plan:  home / self care

## 2014-01-25 ENCOUNTER — Other Ambulatory Visit (HOSPITAL_COMMUNITY): Payer: Self-pay | Admitting: Adult Health

## 2014-01-28 ENCOUNTER — Ambulatory Visit (HOSPITAL_COMMUNITY)
Admit: 2014-01-28 | Discharge: 2014-01-28 | Disposition: A | Discharge status: Home / Self Care | Payer: No Typology Code available for payment source | Source: Ambulatory Visit | Attending: Internal Medicine | Admitting: Internal Medicine

## 2014-01-28 ENCOUNTER — Encounter (HOSPITAL_COMMUNITY): Payer: Self-pay

## 2014-01-28 VITALS — BP 128/82 | HR 77 | Resp 18 | Wt 150.4 lb

## 2014-01-28 DIAGNOSIS — R51 Headache: Secondary | ICD-10-CM | POA: Diagnosis not present

## 2014-01-28 DIAGNOSIS — M109 Gout, unspecified: Secondary | ICD-10-CM | POA: Insufficient documentation

## 2014-01-28 DIAGNOSIS — N184 Chronic kidney disease, stage 4 (severe): Secondary | ICD-10-CM | POA: Diagnosis not present

## 2014-01-28 DIAGNOSIS — I129 Hypertensive chronic kidney disease with stage 1 through stage 4 chronic kidney disease, or unspecified chronic kidney disease: Secondary | ICD-10-CM | POA: Insufficient documentation

## 2014-01-28 DIAGNOSIS — Z720 Tobacco use: Secondary | ICD-10-CM

## 2014-01-28 DIAGNOSIS — Z7982 Long term (current) use of aspirin: Secondary | ICD-10-CM | POA: Insufficient documentation

## 2014-01-28 DIAGNOSIS — I5022 Chronic systolic (congestive) heart failure: Secondary | ICD-10-CM | POA: Diagnosis not present

## 2014-01-28 DIAGNOSIS — H548 Legal blindness, as defined in USA: Secondary | ICD-10-CM | POA: Insufficient documentation

## 2014-01-28 DIAGNOSIS — I10 Essential (primary) hypertension: Secondary | ICD-10-CM

## 2014-01-28 DIAGNOSIS — D759 Disease of blood and blood-forming organs, unspecified: Secondary | ICD-10-CM | POA: Diagnosis not present

## 2014-01-28 DIAGNOSIS — F17200 Nicotine dependence, unspecified, uncomplicated: Secondary | ICD-10-CM | POA: Diagnosis not present

## 2014-01-28 DIAGNOSIS — D571 Sickle-cell disease without crisis: Secondary | ICD-10-CM | POA: Diagnosis not present

## 2014-01-28 DIAGNOSIS — I428 Other cardiomyopathies: Secondary | ICD-10-CM | POA: Insufficient documentation

## 2014-01-28 DIAGNOSIS — I509 Heart failure, unspecified: Secondary | ICD-10-CM | POA: Insufficient documentation

## 2014-01-28 LAB — PRO B NATRIURETIC PEPTIDE: Pro B Natriuretic peptide (BNP): 8929 pg/mL — ABNORMAL HIGH (ref 0–125)

## 2014-01-28 LAB — BASIC METABOLIC PANEL
Anion gap: 16 — ABNORMAL HIGH (ref 5–15)
BUN: 38 mg/dL — ABNORMAL HIGH (ref 6–23)
CO2: 17 mEq/L — ABNORMAL LOW (ref 19–32)
Calcium: 8.3 mg/dL — ABNORMAL LOW (ref 8.4–10.5)
Chloride: 102 mEq/L (ref 96–112)
Creatinine, Ser: 2.49 mg/dL — ABNORMAL HIGH (ref 0.50–1.35)
GFR calc Af Amer: 32 mL/min — ABNORMAL LOW (ref >90)
GFR calc non Af Amer: 27 mL/min — ABNORMAL LOW (ref >90)
Glucose, Bld: 94 mg/dL (ref 70–99)
Potassium: 4.9 mEq/L (ref 3.7–5.3)
Sodium: 135 mEq/L — ABNORMAL LOW (ref 137–147)

## 2014-01-28 MED ORDER — ISOSORBIDE MONONITRATE ER 60 MG PO TB24: 60.0000 mg | ORAL_TABLET | Freq: Every day | ORAL | Status: DC

## 2014-01-28 NOTE — Progress Notes (Signed)
Patient ID: Patrick [name], male   DOB: 06/09/1956, 58 y.o.   MRN: [id number]  Weight Range    145-148 pounds  Baseline proBNP   993.1 on 01/19/12  PCP: [name] : Dr Zigmund Daniel  Opthalmologist: [name]  Guide IT Trial   HPI: Patrick [name] is a 58 y.o. African American male with a history of sickle cell anemia, systolic HF due to NICM EF 40% 11/2012 initially diagnosed 05/2011, no CAD by cath 05/2011, CKD stage III, legally blind, and tobacco abuse.   Admitted 11/18/12 from HF clinic with voume overload. Hemoglobin 6.7 Transfused 2 UPRBC. Diuresed with IV lasix. Lisinopril stopped due to CKD. Discharge weight 143 pounds.    Admitted 02/07/13 through 02/12/13 after being treated for N/V/D, and abdominal pain. CT of abdomen- cholelithias with plans to f/u for elective cholecystectomy. Received 2UPRBC.  Hemoglobin 6.1>7.1.  Diuretics held due to dehydration. He restarted lasix 60 mg bid 02/13/13.   Admitted to [facility] 3/4 through 08/22/13 with anemia and volume overload. Hemglobin was 5.9. Received 1U PRBC with hemoglobin increased to 6.4 Diuresed with IV lasix. Discharged on carvedilol 25 mg twice a day, lasix 80 mg twice a day, hydralazine 25 mg tid, and imdur 30 mg daily.  D/C weight was 140 pounds.  He is not on Spironolactone due to hyperkalemia. He is not on ace inhibitor due to renal failure.  Follow up for Heart Failure:  Since last visit was admitted for observation for CP. His enzymes were negative and kept overnight. Doing pretty good. Denies SOB, PND, CP or Orthopnea. Not as active as he was but wants to start back riding stationary bike. Weight at home 148-150 lbs. Taking medications as prescribed. Followed by [facility]. Still smoking about 8 cigarettes a day.   01/17/12 ECHO 55%-60%  11/20/12 ECHO EF 40% Peak PA pressure 56 11/30/13 ECHO 40-45%   Labs        02/17/13: K+ 5.0, BUN 45, Creatinine 2.2, proBNP 10135       05/21/13 K 5.4 Creatinine 2.25 Pro BNP 3076  06/09/13 K 4.6 Creatinine 2.1 Pro BNP 10,727        07/07/13 Creatinine 2.06 K 5.0         08/19/13 Creatinine 2.5 Hemoglobin 5.9 Pro BNP 8266         08/27/13 K 4.5 Creatinine 2.78 Hemoglobin 7.2        09/21/13 K 4.2, creatinine 2.56         01/14/14 K 4.7, creatinine 2.29  SH: Lives with his cousin in [place]. Smokes 8 cigarettes per day. Occasional ETOH and no drugs  FH: Mother deceased: liver failure, no CAD        Father deceased: CAD, MI  ROS: All systems negative except as listed in HPI, PMH and Problem List.  Past Medical History  Diagnosis Date  . Swelling of extremity, right   . Swelling abdomen   . Swelling of extremity, left   . CHF (congestive heart failure)   . Blood transfusion     "I've had a bunch of them"  . Blood dyscrasia   . Exertional shortness of breath   . Legally blind   . Sickle cell disease, type SS   . [id number])     "probably weekly" (01/13/2014)  . Gout   . Chronic kidney disease (CKD), stage III (moderate)     Current Outpatient Prescriptions  Medication Sig Dispense Refill  . allopurinol (ZYLOPRIM) 100 MG tablet  TAKE 1 TABLET (100 MG TOTAL) BY MOUTH DAILY.  30 tablet  7  . aspirin EC 81 MG tablet Take 81 mg by mouth daily.      . calcitRIOL (ROCALTROL) 0.25 MCG capsule Take 0.25 mcg by mouth 3 (three) times a week. Monday, Wednesday and Friday      . carvedilol (COREG) 25 MG tablet Take 25 mg by mouth 2 (two) times daily with a meal.      . folic acid (FOLVITE) 1 MG tablet Take 1 mg by mouth daily.      . furosemide (LASIX) 40 MG tablet Take 80 mg by mouth daily.      . hydrALAZINE (APRESOLINE) 50 MG tablet Take 75 mg by mouth 3 (three) times daily.      . isosorbide mononitrate (IMDUR) 30 MG 24 hr tablet Take 30 mg by mouth every morning.       No current facility-administered medications for this encounter.    Filed Vitals:   01/28/14 1200  BP: 128/82  Pulse: 77  Resp: 18  Weight: 150 lb 6 oz (68.21 kg)  SpO2: 94%    PHYSICAL  EXAM: General:  Chronically ill appearing.  No resp difficulty  HEENT: normal except for mild scleral icterus Neck: supple. JVP 7; Carotids 2+ bilaterally; no bruits. No lymphadenopathy or thryomegaly appreciated. Cor: PMI normal Regular rate & rhythm. No rubs, gallops 2/6 TR and 2/6 MR Lungs: clear    Abdomen: soft, nontender, non-distended. No hepatosplenomegaly. No bruits or masses. Good bowel sounds. Extremities: no cyanosis, rash,  edema ,  + clubbing. Neuro: alert & orientedx3, cranial nerves grossly intact. Moves all 4 extremities w/o difficulty. Affect pleasant.  ASSESSMENT & PLAN:  1. Chronic Systolic Heart Failure:  NICM. Cath 05/2011 without significant coronary disease.  ECHO 11/2013 EF 40-45%. - NYHA II symptoms and volume status stable. Will continue lasix 80 mg daily. Check BMET and pro-BNP today. -  On goal coreg 25 mg twice a day.   - Continue hydralazine to 75 mg tid and increase Imdur 60 mg daily. - He is not on Ace/Arb/spiro due to hyperkalemia/CKD - Reinforced the need and importance of daily weights, a low sodium diet, and fluid restriction (less than 2 L a day). Instructed to call the HF clinic if weight increases more than 3 lbs overnight or 5 lbs in a week.  2. HTN: stable, continue current medications.  3  Sickle Cell: Follows closely in sickle cell clinic.  4. Current smoker:  Declines smoking cessation and has increased how many cigarettes he has been smoking daily. Encouraged him to continue to try and quit.  5. CKD stabe IV: Continue to follow renal function closely. Check BMET and follow up with nephrologist.   F/U 6 weeks Rande Brunt NP-C  01/28/2014

## 2014-01-28 NOTE — Patient Instructions (Signed)
Doing well.  Increase your Imdur to 60 mg (2 tablets) daily. You currently have 30 mg tablets. When you run out I sent a new prescription to your pharmacy for 60 mg tablets, so then you will take 1 tablet daily.  Call any issues.  Follow up in 6 weeks.  Do the following things EVERYDAY: 1) Weigh yourself in the morning before breakfast. Write it down and keep it in a log. 2) Take your medicines as prescribed 3) Eat low salt foods-Limit salt (sodium) to 2000 mg per day.  4) Stay as active as you can everyday 5) Limit all fluids for the day to less than 2 liters 6)

## 2014-02-08 ENCOUNTER — Ambulatory Visit: Payer: No Typology Code available for payment source | Admitting: Internal Medicine

## 2014-02-08 ENCOUNTER — Non-Acute Institutional Stay (HOSPITAL_COMMUNITY)
Admission: AD | Admit: 2014-02-08 | Discharge: 2014-02-08 | Disposition: A | Discharge status: Home / Self Care | Payer: No Typology Code available for payment source | Source: Ambulatory Visit | Attending: Nephrology | Admitting: Nephrology

## 2014-02-08 ENCOUNTER — Encounter: Payer: Self-pay | Admitting: Hematology

## 2014-02-08 ENCOUNTER — Encounter: Payer: Self-pay | Admitting: Internal Medicine

## 2014-02-08 VITALS — BP 147/79 | HR 71 | Temp 97.7°F | Resp 14 | Ht 71.0 in | Wt 144.0 lb

## 2014-02-08 DIAGNOSIS — N183 Chronic kidney disease, stage 3 unspecified: Secondary | ICD-10-CM | POA: Insufficient documentation

## 2014-02-08 DIAGNOSIS — T50905S Adverse effect of unspecified drugs, medicaments and biological substances, sequela: Secondary | ICD-10-CM

## 2014-02-08 DIAGNOSIS — H548 Legal blindness, as defined in USA: Secondary | ICD-10-CM | POA: Diagnosis not present

## 2014-02-08 DIAGNOSIS — R636 Underweight: Secondary | ICD-10-CM

## 2014-02-08 DIAGNOSIS — D638 Anemia in other chronic diseases classified elsewhere: Secondary | ICD-10-CM | POA: Diagnosis not present

## 2014-02-08 DIAGNOSIS — D571 Sickle-cell disease without crisis: Secondary | ICD-10-CM | POA: Insufficient documentation

## 2014-02-08 LAB — CBC WITH DIFFERENTIAL/PLATELET
Basophils Absolute: 0.2 10*3/uL — ABNORMAL HIGH (ref 0.0–0.1)
Basophils Relative: 2 % — ABNORMAL HIGH (ref 0–1)
Eosinophils Absolute: 0.4 10*3/uL (ref 0.0–0.7)
Eosinophils Relative: 4 % (ref 0–5)
HCT: 20.3 % — ABNORMAL LOW (ref 39.0–52.0)
Hemoglobin: 7.5 g/dL — ABNORMAL LOW (ref 13.0–17.0)
Lymphocytes Relative: 17 % (ref 12–46)
Lymphs Abs: 1.6 10*3/uL (ref 0.7–4.0)
MCH: 34.7 pg — ABNORMAL HIGH (ref 26.0–34.0)
MCHC: 36.9 g/dL — ABNORMAL HIGH (ref 30.0–36.0)
MCV: 94 fL (ref 78.0–100.0)
Monocytes Absolute: 2.3 10*3/uL — ABNORMAL HIGH (ref 0.1–1.0)
Monocytes Relative: 24 % — ABNORMAL HIGH (ref 3–12)
Neutro Abs: 5.1 10*3/uL (ref 1.7–7.7)
Neutrophils Relative %: 53 % (ref 43–77)
Platelets: 183 10*3/uL (ref 150–400)
RBC: 2.16 MIL/uL — ABNORMAL LOW (ref 4.22–5.81)
RDW: 20.5 % — ABNORMAL HIGH (ref 11.5–15.5)
WBC: 9.6 10*3/uL (ref 4.0–10.5)
nRBC: 2 /100 WBC — ABNORMAL HIGH

## 2014-02-08 LAB — IRON AND TIBC
Iron: 97 ug/dL (ref 42–135)
Saturation Ratios: 52 % (ref 20–55)
TIBC: 186 ug/dL — ABNORMAL LOW (ref 215–435)
UIBC: 89 ug/dL — ABNORMAL LOW (ref 125–400)

## 2014-02-08 LAB — FERRITIN: Ferritin: 558 ng/mL — ABNORMAL HIGH (ref 22–322)

## 2014-02-08 LAB — HEMOGLOBIN AND HEMATOCRIT, BLOOD
HCT: 19.7 % — ABNORMAL LOW (ref 39.0–52.0)
Hemoglobin: 7.4 g/dL — ABNORMAL LOW (ref 13.0–17.0)

## 2014-02-08 MED ORDER — OXYCODONE HCL 5 MG PO TABA: 5.0000 mg | ORAL_TABLET | Freq: Four times a day (QID) | ORAL | Status: DC | PRN

## 2014-02-08 MED ORDER — DARBEPOETIN ALFA-POLYSORBATE 100 MCG/0.5ML IJ SOLN
100.0000 ug | Freq: Once | INTRAMUSCULAR | Status: AC
  Administered 2014-02-08: 100 ug via SUBCUTANEOUS
  Filled 2014-02-08: qty 0.5

## 2014-02-08 MED ORDER — HYDROXYUREA 500 MG PO CAPS: 500.0000 mg | ORAL_CAPSULE | Freq: Every day | ORAL | Status: DC

## 2014-02-08 NOTE — Progress Notes (Signed)
Patient ID: Patrick [name], male   DOB: 11/14/1955, 58 y.o.   MRN: [id number] Lab results faxed to Dr. Lorrene Reid per request.

## 2014-02-08 NOTE — Progress Notes (Unsigned)
Patient ID: Patrick [name], male   DOB: 24-Nov-1955, 58 y.o.   MRN: [id number]   Patrick [name], is a 58 y.o. male  [id number]  [id number]  DOB - 1956-04-16  CC: No chief complaint on file.      HPI: Patrick [name] is a 58 y.o. male here today for follow up on his Hb SS. He reports that he was doing well up until last week when he started having back and leg pain. He has been taking Tylenol 2000 mg q 5 hours PRN pain. He describes the pain as 6/10 and sharp in nature. Localized to back and right lig but non-radiating.   He has been following with Dr. Lorrene Reid for CKD and has begun receiving Arenesp for renally mediated anemia. He has not been on Hydrea as his Hb was low and he was sceptical about it's use. However he has had a trend of Hb >7 and he is willing to  Patient has No headache, No chest pain, No abdominal pain - No Nausea, No new weakness tingling or numbness, No Cough - SOB.  No Known Allergies Past Medical History  Diagnosis Date  . Swelling of extremity, right   . Swelling abdomen   . Swelling of extremity, left   . CHF (congestive heart failure)   . Blood transfusion     "I've had a bunch of them"  . Blood dyscrasia   . Exertional shortness of breath   . Legally blind   . Sickle cell disease, type SS   . [id number])     "probably weekly" (01/13/2014)  . Gout   . Chronic kidney disease (CKD), stage III (moderate)    Current Outpatient Prescriptions on File Prior to Visit  Medication Sig Dispense Refill  . allopurinol (ZYLOPRIM) 100 MG tablet TAKE 1 TABLET (100 MG TOTAL) BY MOUTH DAILY.  30 tablet  7  . aspirin EC 81 MG tablet Take 81 mg by mouth daily.      . calcitRIOL (ROCALTROL) 0.25 MCG capsule Take 0.25 mcg by mouth 3 (three) times a week. Monday, Wednesday and Friday      . carvedilol (COREG) 25 MG tablet Take 25 mg by mouth 2 (two) times daily with a meal.      . folic acid (FOLVITE) 1 MG tablet Take 1 mg by mouth daily.      . furosemide  (LASIX) 40 MG tablet Take 80 mg by mouth daily.      . hydrALAZINE (APRESOLINE) 50 MG tablet Take 75 mg by mouth 3 (three) times daily.      . isosorbide mononitrate (IMDUR) 60 MG 24 hr tablet Take 1 tablet (60 mg total) by mouth daily.  30 tablet  3   No current facility-administered medications on file prior to visit.   Family History  Problem Relation Age of Onset  . Alcohol abuse Mother   . Liver disease Mother   . Cirrhosis Mother    History   Social History  . Marital Status: Divorced    Spouse Name: N/A    Number of Children: N/A  . Years of Education: N/A   Occupational History  . Not on file.   Social History Main Topics  . Smoking status: Current Every Day Smoker -- 0.50 packs/day for 41 years    Types: Cigarettes  . Smokeless tobacco: Never Used  . Alcohol Use: 1.2 oz/week    2 Cans of beer per week  . Drug Use: No  . Sexual Activity:  Not Currently   Other Topics Concern  . Not on file   Social History Narrative  . No narrative on file    Review of Systems: Constitutional: Negative for fever, chills, diaphoresis, activity change, appetite change and fatigue. HENT: Negative for ear pain, nosebleeds, congestion, facial swelling, rhinorrhea, neck pain, neck stiffness and ear discharge.  Eyes: Negative for pain, discharge, redness, itching and visual disturbance. Respiratory: Negative for cough, choking, chest tightness, shortness of breath, wheezing and stridor.  Cardiovascular: Negative for chest pain, palpitations and leg swelling. Gastrointestinal: Negative for abdominal distention. Genitourinary: Negative for dysuria, urgency, frequency, hematuria, flank pain, decreased urine volume, difficulty urinating and dyspareunia.  Musculoskeletal: Negative for back pain, joint swelling, arthralgia and gait problem. Neurological: Negative for dizziness, tremors, seizures, syncope, facial asymmetry, speech difficulty, weakness, light-headedness, numbness and  headaches.  Hematological: Negative for adenopathy. Does not bruise/bleed easily. Psychiatric/Behavioral: Negative for hallucinations, behavioral problems, confusion, dysphoric mood, decreased concentration and agitation.    Objective:   Filed Vitals:   02/08/14 1323  BP: 147/79  Pulse: 71  Temp: 97.7 F (36.5 C)  Resp: 14    Physical Exam: Constitutional: Patient appears well-developed and well-nourished. No distress. HENT: Normocephalic, atraumatic, External right and left ear normal. Oropharynx is clear and moist.  Eyes: Conjunctivae and EOM are normal. PERRLA, no scleral icterus. Neck: Normal ROM. Neck supple. No JVD. No tracheal deviation. No thyromegaly. CVS: RRR, S1/S2 +, no murmurs, no gallops, no carotid bruit.  Pulmonary: Effort and breath sounds normal, no stridor, rhonchi, wheezes, rales.  Abdominal: Soft. BS +, no distension, tenderness, rebound or guarding.  Musculoskeletal: Normal range of motion. No edema and no tenderness.  Lymphadenopathy: No lymphadenopathy noted, cervical, inguinal or axillary Neuro: Alert. Normal reflexes, muscle tone coordination. No cranial nerve deficit. Skin: Skin is warm and dry. No rash noted. Not diaphoretic. No erythema. No pallor. Psychiatric: Normal mood and affect. Behavior, judgment, thought content normal.  Lab Results  Component Value Date   WBC 9.7 01/14/2014   HGB 7.4* 02/08/2014   HCT 19.7* 02/08/2014   MCV 92.6 01/14/2014   PLT 156 01/14/2014   Lab Results  Component Value Date   CREATININE 2.49* 01/28/2014   BUN 38* 01/28/2014   NA 135* 01/28/2014   K 4.9 01/28/2014   CL 102 01/28/2014   CO2 17* 01/28/2014    Lab Results  Component Value Date   HGBA1C <4.0 06/07/2011   Lipid Panel  No results found for this basename: chol, trig, hdl, cholhdl, vldl, ldlcalc       Assessment and plan:   1. Hb-SS disease without crisis - Pt has been having pain in right knee and leg. He has been treating pain with Tylenol at at  dose of approximately 2000 mg q 5 hours for the last week. Will prescribe non- APAP medication and check LFT's to ensure no significant adverse effect from Tylenol.  - Pt not currently on Hydrea. He is now agreeable to taking Hydrea. Will check CBC with diff today and start on low dose of 500 mg daily. Will  - CBC with Differential - Comprehensive metabolic panel - OxyCODONE HCl, Abuse Deter, 5 MG TABA; Take 5 mg by mouth every 6 (six) hours as needed.  Dispense: 60 tablet; Refill: 0 - hydroxyurea (HYDREA) 500 MG capsule; Take 1 capsule (500 mg total) by mouth daily. May take with food to minimize GI side effects.  Dispense: 30 capsule; Refill: 3 - Lactate dehydrogenase  2. Adverse drug effect, sequela -  Pt has been taking Tylenol excessively in the last week. Check LFT's. - Comprehensive metabolic panel  3. Underweight - Pt is underweight and currently has been unable to gain weight despite a directed attempt to eat larger amounts. I spoke with patient about a high protein diet which is beneficial in patients with Hb SS as their metabolism is increased. However he does have CKD which may limit his protein intake. - Referral to nutrition and diabetes services   Follow up in 1 month for Medication titration and Influenza vaccine  The patient was given clear instructions to go to ER or return to medical center if symptoms don't improve, worsen or new problems develop. The patient verbalized understanding. The patient was told to call to get lab results if they haven't heard anything in the next week.     This note has been created with Surveyor, quantity. Any transcriptional errors are unintentional.    Cleven Jansma A., MD [place], [place]   02/08/2014, 1:55 PM

## 2014-02-08 NOTE — Procedures (Signed)
[facility]  Procedure Note  Patrick [name] [id number] DOB: 1955/06/24 DOA: 02/08/2014   Dr. Jamal Maes  Associated Diagnosis: 585.3, 285.29  Procedure Note: labs drawn per order, subcu injection administered per order   Condition During Procedure: patient denies any complaints   Condition at Discharge:  Patient stable, ambulatory at discharge   Roberto Scales, [facility]

## 2014-02-09 LAB — LACTATE DEHYDROGENASE: LDH: 700 U/L — ABNORMAL HIGH (ref 94–250)

## 2014-02-09 LAB — COMPREHENSIVE METABOLIC PANEL
ALT: 16 U/L (ref 0–53)
AST: 47 U/L — ABNORMAL HIGH (ref 0–37)
Albumin: 4.8 g/dL (ref 3.5–5.2)
Alkaline Phosphatase: 80 U/L (ref 39–117)
BUN: 57 mg/dL — ABNORMAL HIGH (ref 6–23)
CO2: 19 mEq/L (ref 19–32)
Calcium: 8.9 mg/dL (ref 8.4–10.5)
Chloride: 101 mEq/L (ref 96–112)
Creat: 2.75 mg/dL — ABNORMAL HIGH (ref 0.50–1.35)
Glucose, Bld: 86 mg/dL (ref 70–99)
Potassium: 5 mEq/L (ref 3.5–5.3)
Sodium: 134 mEq/L — ABNORMAL LOW (ref 135–145)
Total Bilirubin: 2.8 mg/dL — ABNORMAL HIGH (ref 0.2–1.2)
Total Protein: 8.1 g/dL (ref 6.0–8.3)

## 2014-02-18 DIAGNOSIS — D571 Sickle-cell disease without crisis: Secondary | ICD-10-CM | POA: Diagnosis not present

## 2014-02-18 DIAGNOSIS — H353190 Nonexudative age-related macular degeneration, unspecified eye, stage unspecified: Secondary | ICD-10-CM | POA: Diagnosis not present

## 2014-02-18 DIAGNOSIS — H353290 Exudative age-related macular degeneration, unspecified eye, stage unspecified: Secondary | ICD-10-CM | POA: Diagnosis not present

## 2014-02-26 ENCOUNTER — Other Ambulatory Visit: Payer: Self-pay | Admitting: *Deleted

## 2014-02-26 ENCOUNTER — Encounter: Payer: No Typology Code available for payment source | Attending: Internal Medicine | Admitting: *Deleted

## 2014-02-26 ENCOUNTER — Encounter: Payer: Self-pay | Admitting: *Deleted

## 2014-02-26 VITALS — Ht 71.0 in | Wt 147.2 lb

## 2014-02-26 DIAGNOSIS — N184 Chronic kidney disease, stage 4 (severe): Secondary | ICD-10-CM

## 2014-02-26 DIAGNOSIS — D571 Sickle-cell disease without crisis: Secondary | ICD-10-CM | POA: Insufficient documentation

## 2014-02-26 DIAGNOSIS — N189 Chronic kidney disease, unspecified: Secondary | ICD-10-CM | POA: Diagnosis not present

## 2014-02-26 DIAGNOSIS — Z713 Dietary counseling and surveillance: Secondary | ICD-10-CM | POA: Diagnosis not present

## 2014-02-26 DIAGNOSIS — Z01810 Encounter for preprocedural cardiovascular examination: Secondary | ICD-10-CM

## 2014-02-26 DIAGNOSIS — R636 Underweight: Secondary | ICD-10-CM | POA: Insufficient documentation

## 2014-02-26 NOTE — Progress Notes (Signed)
Medical Nutrition Therapy:  Appt start time: 1100 end time:  [id number].  Assessment:  Patient here today for inability to gain weight. He has a history of sickle cell disease and CKD. He reports that his usual weight is 157-160 pounds about a year ago. Current weight is 147.2 pounds, which has been stable for several months. He reports a reduced appetite, sometimes skipping lunch. However, he has been trying to eat more regularly. Intake consists of a lot of processed and fast foods, but he does eat healthy when he cooks for himself. He does not currently exercise, but was going to the gym doing light cardio and resistance exercises. His only dietary restriction for CKD is reduced sodium. May need to moderate protein due to kidney function.   MEDICATIONS: See list   DIETARY INTAKE:   Usual eating pattern includes 2 meals and 2 snacks per day.  24-hr recall:  B ( AM): Sausage, egg, cheese sandwich, hash brown, orange juice, water  Snk ( AM): None  L ( PM): Sometimes skips, nuts Snk ( PM): [employer], chips, cheese puffs, fruit D ( PM): Eats out [employer], hamburger, fries, water Snk ( PM): Same Beverages: Water, juice  Usual physical activity: Previously: gym at apartment, stationary bike 30 minutes, weights   Estimated energy needs: 2400 calories 300 g carbohydrates 90 g protein 93 g fat  Progress Towards Goal(s):  In progress.   Nutritional Diagnosis:  Richardson-3.2 Unintentional weight loss As related to decreased appetite.  As evidenced by weight 90% of usual weight.    Intervention:  Nutrition counseling. We discussed strategies to increase calories in the diet to promote healthy weight gain, including eating at least 3 meals daily with 2-3 snacks, adding fat (preferrably healthy fats) to vegetables and starches, snacking on nuts/cheese, including a good source of complete proteins (meat, eggs, dairy) at all meals, and resistance exercise as a way of increasing lean mass.    Handouts given during visit include:  None, patient is legally blind. Teach back method used. Patient able to state ways he will increase calories in the diet.   Monitoring/Evaluation:  Dietary intake, exercise, and body weight prn.

## 2014-03-08 ENCOUNTER — Non-Acute Institutional Stay (HOSPITAL_COMMUNITY)
Admission: AD | Admit: 2014-03-08 | Discharge: 2014-03-08 | Disposition: A | Discharge status: Home / Self Care | Payer: Medicare Other | Source: Ambulatory Visit | Attending: Internal Medicine | Admitting: Internal Medicine

## 2014-03-08 ENCOUNTER — Ambulatory Visit (INDEPENDENT_AMBULATORY_CARE_PROVIDER_SITE_OTHER): Payer: Medicare Other | Admitting: Family Medicine

## 2014-03-08 VITALS — BP 123/84 | HR 74 | Temp 97.9°F | Resp 16 | Ht 71.0 in | Wt 152.0 lb

## 2014-03-08 DIAGNOSIS — N184 Chronic kidney disease, stage 4 (severe): Secondary | ICD-10-CM | POA: Diagnosis not present

## 2014-03-08 DIAGNOSIS — F17200 Nicotine dependence, unspecified, uncomplicated: Secondary | ICD-10-CM

## 2014-03-08 DIAGNOSIS — Z720 Tobacco use: Secondary | ICD-10-CM

## 2014-03-08 DIAGNOSIS — D571 Sickle-cell disease without crisis: Secondary | ICD-10-CM | POA: Insufficient documentation

## 2014-03-08 DIAGNOSIS — I158 Other secondary hypertension: Secondary | ICD-10-CM

## 2014-03-08 DIAGNOSIS — E559 Vitamin D deficiency, unspecified: Secondary | ICD-10-CM | POA: Insufficient documentation

## 2014-03-08 DIAGNOSIS — Z23 Encounter for immunization: Secondary | ICD-10-CM

## 2014-03-08 LAB — COMPREHENSIVE METABOLIC PANEL
ALT: 14 U/L (ref 0–53)
AST: 31 U/L (ref 0–37)
Albumin: 3.7 g/dL (ref 3.5–5.2)
Alkaline Phosphatase: 83 U/L (ref 39–117)
Anion gap: 11 (ref 5–15)
BUN: 34 mg/dL — ABNORMAL HIGH (ref 6–23)
CO2: 20 mEq/L (ref 19–32)
Calcium: 8.3 mg/dL — ABNORMAL LOW (ref 8.4–10.5)
Chloride: 105 mEq/L (ref 96–112)
Creatinine, Ser: 2.17 mg/dL — ABNORMAL HIGH (ref 0.50–1.35)
GFR calc Af Amer: 37 mL/min — ABNORMAL LOW (ref >90)
GFR calc non Af Amer: 32 mL/min — ABNORMAL LOW (ref >90)
Glucose, Bld: 98 mg/dL (ref 70–99)
Potassium: 4.8 mEq/L (ref 3.7–5.3)
Sodium: 136 mEq/L — ABNORMAL LOW (ref 137–147)
Total Bilirubin: 1.8 mg/dL — ABNORMAL HIGH (ref 0.3–1.2)
Total Protein: 7.4 g/dL (ref 6.0–8.3)

## 2014-03-08 LAB — IRON AND TIBC
Iron: 94 ug/dL (ref 42–135)
Saturation Ratios: 51 % (ref 20–55)
TIBC: 183 ug/dL — ABNORMAL LOW (ref 215–435)
UIBC: 89 ug/dL — ABNORMAL LOW (ref 125–400)

## 2014-03-08 LAB — FERRITIN: Ferritin: 405 ng/mL — ABNORMAL HIGH (ref 22–322)

## 2014-03-08 LAB — HEMOGLOBIN AND HEMATOCRIT, BLOOD
HCT: 21.8 % — ABNORMAL LOW (ref 39.0–52.0)
Hemoglobin: 7.9 g/dL — ABNORMAL LOW (ref 13.0–17.0)

## 2014-03-08 MED ORDER — DARBEPOETIN ALFA-POLYSORBATE 100 MCG/0.5ML IJ SOLN
100.0000 ug | Freq: Once | INTRAMUSCULAR | Status: AC
  Administered 2014-03-08: 100 ug via SUBCUTANEOUS
  Filled 2014-03-08: qty 0.5

## 2014-03-08 NOTE — Procedures (Signed)
[facility]  Procedure Note  KHOEN ESCALON [id number] DOB: 1955-08-23 DOA: 03/08/2014   PCP: MATTHEWS,MICHELLE A., MD   Associated Diagnosis: CKD stage 3, Anemia of other chronic illness  Procedure Note: Labs drawn and injection given as ordered   Condition During Procedure: Patient tolerated well   Condition at Discharge: Tolerated Well. No complaints. Patient in no apparent distress. Patient left day hospital with belongings ambulatory via self.    Wendie Simmer, RN  [facility]

## 2014-03-08 NOTE — Progress Notes (Signed)
Subjective:    Patient ID: Patrick [name], male    DOB: 06-Dec-1955, 58 y.o.   MRN: [id number]  HPI Patient presents for a follow up of sickle cell anemia. He states that he is doing well. He currently denies sickle cell pain. He reports that he has not taken pain medication in greater than 1 week. Patient was started on a low dose of Hydroxyurea 500 mg at last visit. He reports that he is doing well.   He has been following with Dr. Lorrene Reid for CKD and has begun receiving Arenesp for renally mediated anemia. Patient received last dose of Aranesp this am. Patient has been receiving injections without complication.  He  has had a trend of hemoglobin greater than 7  Patient denies headache, fever, chest pain, abdominal, bone pain, weakness, numbness or tingling.   Review of Systems  Constitutional: Negative.   HENT: Negative.   Eyes: Negative.   Respiratory: Negative.   Cardiovascular: Negative.   Gastrointestinal: Negative.   Endocrine: Negative.   Genitourinary: Negative.   Musculoskeletal: Negative.   Skin: Negative.   Allergic/Immunologic: Negative.   Neurological: Negative.   Hematological: Negative.   Psychiatric/Behavioral: Negative.        Objective:   Physical Exam  Constitutional: He is oriented to person, place, and time. He appears well-developed and well-nourished.  HENT:  Head: Normocephalic and atraumatic.  Right Ear: External ear normal.  Left Ear: External ear normal.  Mouth/Throat: Oropharynx is clear and moist.  Eyes: Conjunctivae are normal. Pupils are equal, round, and reactive to light.  Neck: Normal range of motion. Neck supple.  Cardiovascular: Normal rate, regular rhythm, normal heart sounds and intact distal pulses.   Pulmonary/Chest: Effort normal and breath sounds normal.  Abdominal: Soft. Bowel sounds are normal.  Musculoskeletal: Normal range of motion.  Neurological: He is alert and oriented to person, place, and time. He has normal  reflexes.  Skin: Skin is warm and dry.  Psychiatric: He has a normal mood and affect. His behavior is normal. Judgment and thought content normal.         BP 123/84  Pulse 74  Temp(Src) 97.9 F (36.6 C) (Oral)  Resp 16  Ht 5\' 11"  (1.803 m)  Wt 152 lb (68.947 kg)  BMI 21.21 kg/m2 Assessment & Plan:  1. Hb-SS disease without crisis Continue Hydrea 500 mg daily. We discussed the need for good hydration, monitoring of hydration status, avoidance of heat, cold, stress, and infection triggers. We discussed the risks and benefits of Hydrea, including bone marrow suppression, the possibility of GI upset, skin ulcers, hair thinning, and teratogenicity. The patient was reminded of the need to seek medical attention of any symptoms of bleeding, anemia, or infection. Continue folic acid 1 mg daily to prevent aplastic bone marrow crises. Patient reports that he is taking folic acid consistently. He states that he has not required pain medications in 1 week. Reviewed labs from this am. Consistent with previous values. Patient is to scheduled to  follow up with Dr. Lorrene Reid as scheduled.  Current Hgb is 7.9, will add on CMP.  2. Tobacco abuse Smoking cessation instruction/counseling given:  counseled patient on the dangers of tobacco use, advised patient to stop smoking, and reviewed strategies to maximize success  3. CKD (chronic kidney disease), stage IV Add on CMP to check BUN and Creatinine.   4. Need for immunization against influenza - Flu Vaccine QUAD 36+ mos IM (Fluarix)  5.  Pulmonary evaluation - Patient denies  severe recurrent wheezes, shortness of breath with exercise, or persistent cough. If these symptoms develop, pulmonary function tests with spirometry will be ordered, and if abnormal, plan on referral to Pulmonology for further evaluation.  6. Eye - Patient is legally blind, he is up to date with annual eye exam  7. Other secondary hypertension- Stable on current medication regimen.  He is scheduled to follow up with cardiologist.   RTC: 1 month  Capitola Ladson M, FNP

## 2014-03-08 NOTE — Discharge Instructions (Signed)
Chronic Kidney Disease °Chronic kidney disease occurs when the kidneys are damaged over a long period. The kidneys are two organs that lie on either side of the spine between the middle of the back and the front of the abdomen. The kidneys:  °· Remove wastes and extra water from the blood.   °· Produce important hormones. These help keep bones strong, regulate blood pressure, and help create red blood cells.   °· Balance the fluids and chemicals in the blood and tissues. °A small amount of kidney damage may not cause problems, but a large amount of damage may make it difficult or impossible for the kidneys to work the way they should. If steps are not taken to slow down the kidney damage or stop it from getting worse, the kidneys may stop working permanently. Most of the time, chronic kidney disease does not go away. However, it can often be controlled, and those with the disease can usually live normal lives. °CAUSES  °The most common causes of chronic kidney disease are diabetes and high blood pressure (hypertension). Chronic kidney disease may also be caused by:  °· Diseases that cause the kidneys' filters to become inflamed.   °· Diseases that affect the immune system.   °· Genetic diseases.   °· Medicines that damage the kidneys, such as anti-inflammatory medicines.   °· Poisoning or exposure to toxic substances.   °· A reoccurring kidney or urinary infection.   °· A problem with urine flow. This may be caused by:   °¨ Cancer.   °¨ Kidney stones.   °¨ An enlarged prostate in males. °SIGNS AND SYMPTOMS  °Because the kidney damage in chronic kidney disease occurs slowly, symptoms develop slowly and may not be obvious until the kidney damage becomes severe. A person may have a kidney disease for years without showing any symptoms. Symptoms can include:  °· Swelling (edema) of the legs, ankles, or feet.   °· Tiredness (lethargy).   °· Nausea or vomiting.   °· Confusion.   °· Problems with urination, such as:    °¨ Decreased urine production.   °¨ Frequent urination, especially at night.   °¨ Frequent accidents in children who are potty trained.   °· Muscle twitches and cramps.   °· Shortness of breath.  °· Weakness.   °· Persistent itchiness.   °· Loss of appetite. °· Metallic taste in the mouth. °· Trouble sleeping. °· Slowed development in children. °· Short stature in children. °DIAGNOSIS  °Chronic kidney disease may be detected and diagnosed by tests, including blood, urine, imaging, or kidney biopsy tests.  °TREATMENT  °Most chronic kidney diseases cannot be cured. Treatment usually involves relieving symptoms and preventing or slowing the progression of the disease. Treatment may include:  °· A special diet. You may need to avoid alcohol and foods that are salty and high in potassium.   °· Medicines. These may:   °¨ Lower blood pressure.   °¨ Relieve anemia.   °¨ Relieve swelling.   °¨ Protect the bones. °HOME CARE INSTRUCTIONS  °· Follow your prescribed diet.   °· Take medicines only as directed by your health care provider. Do not take any new medicines (prescription, over-the-counter, or nutritional supplements) unless approved by your health care provider. Many medicines can worsen your kidney damage or need to have the dose adjusted.   °· Quit smoking if you smoke. Talk to your health care provider about a smoking cessation program.   °· Keep all follow-up visits as directed by your health care provider. °SEEK IMMEDIATE MEDICAL CARE IF: °· Your symptoms get worse or you develop new symptoms.   °· You develop symptoms of end-stage kidney disease. These   include:   Headaches.   Abnormally dark or light skin.   Numbness in the hands or feet.   Easy bruising.   Frequent hiccups.   Menstruation stops.   You have a fever.   You have decreased urine production.   You havepain or bleeding when urinating. MAKE SURE YOU:  Understand these instructions.  Will watch your condition.  Will  get help right away if you are not doing well or get worse. FOR MORE INFORMATION   American Association of Kidney Patients: BombTimer.gl  National Kidney Foundation: www.kidney.[place]: https://mathis.com/  [facility]: www.lifeoptions.org and www.kidneyschool.org Document Released: 03/13/2008 Document Revised: 10/19/2013 Document Reviewed: 02/01/2012 [facility] Patient Information 2015 [place], [place]. This information is not intended to replace advice given to you by your health care provider. Make sure you discuss any questions you have with your health care provider. Anemia, Nonspecific Anemia is a condition in which the concentration of red blood cells or hemoglobin in the blood is below normal. Hemoglobin is a substance in red blood cells that carries oxygen to the tissues of the body. Anemia results in not enough oxygen reaching these tissues.  CAUSES  Common causes of anemia include:   Excessive bleeding. Bleeding may be internal or external. This includes excessive bleeding from periods (in women) or from the intestine.   Poor nutrition.   Chronic kidney, thyroid, and liver disease.  Bone marrow disorders that decrease red blood cell production.  Cancer and treatments for cancer.  HIV, AIDS, and their treatments.  Spleen problems that increase red blood cell destruction.  Blood disorders.  Excess destruction of red blood cells due to infection, medicines, and autoimmune disorders. SIGNS AND SYMPTOMS   Minor weakness.   Dizziness.   Headache.  Palpitations.   Shortness of breath, especially with exercise.   Paleness.  Cold sensitivity.  Indigestion.  Nausea.  Difficulty sleeping.  Difficulty concentrating. Symptoms may occur suddenly or they may develop slowly.  DIAGNOSIS  Additional blood tests are often needed. These help your health care provider determine the best treatment. Your health care provider will  check your stool for blood and look for other causes of blood loss.  TREATMENT  Treatment varies depending on the cause of the anemia. Treatment can include:   Supplements of iron, vitamin 123456, or folic acid.   Hormone medicines.   A blood transfusion. This may be needed if blood loss is severe.   Hospitalization. This may be needed if there is significant continual blood loss.   Dietary changes.  Spleen removal. HOME CARE INSTRUCTIONS Keep all follow-up appointments. It often takes many weeks to correct anemia, and having your health care provider check on your condition and your response to treatment is very important. SEEK IMMEDIATE MEDICAL CARE IF:   You develop extreme weakness, shortness of breath, or chest pain.   You become dizzy or have trouble concentrating.  You develop heavy vaginal bleeding.   You develop a rash.   You have bloody or black, tarry stools.   You faint.   You vomit up blood.   You vomit repeatedly.   You have abdominal pain.  You have a fever or persistent symptoms for more than 2-3 days.   You have a fever and your symptoms suddenly get worse.   You are dehydrated.  MAKE SURE YOU:  Understand these instructions.  Will watch your condition.  Will get help right away if you are not doing well or get worse.  Document Released: 07/12/2004 Document Revised: 02/04/2013 Document Reviewed: 11/28/2012 [facility] Patient Information 2015 [place], [place]. This information is not intended to replace advice given to you by your health care provider. Make sure you discuss any questions you have with your health care provider.

## 2014-03-11 ENCOUNTER — Ambulatory Visit (HOSPITAL_COMMUNITY)
Admission: RE | Admit: 2014-03-11 | Discharge: 2014-03-11 | Disposition: A | Discharge status: Home / Self Care | Payer: Medicare Other | Source: Ambulatory Visit | Attending: Internal Medicine | Admitting: Internal Medicine

## 2014-03-11 VITALS — BP 130/80 | HR 68 | Wt 150.0 lb

## 2014-03-11 DIAGNOSIS — Z7982 Long term (current) use of aspirin: Secondary | ICD-10-CM | POA: Insufficient documentation

## 2014-03-11 DIAGNOSIS — I5022 Chronic systolic (congestive) heart failure: Secondary | ICD-10-CM | POA: Diagnosis not present

## 2014-03-11 DIAGNOSIS — H548 Legal blindness, as defined in USA: Secondary | ICD-10-CM | POA: Insufficient documentation

## 2014-03-11 DIAGNOSIS — D571 Sickle-cell disease without crisis: Secondary | ICD-10-CM | POA: Insufficient documentation

## 2014-03-11 DIAGNOSIS — N184 Chronic kidney disease, stage 4 (severe): Secondary | ICD-10-CM

## 2014-03-11 DIAGNOSIS — I10 Essential (primary) hypertension: Secondary | ICD-10-CM

## 2014-03-11 DIAGNOSIS — F17200 Nicotine dependence, unspecified, uncomplicated: Secondary | ICD-10-CM | POA: Diagnosis not present

## 2014-03-11 DIAGNOSIS — I428 Other cardiomyopathies: Secondary | ICD-10-CM | POA: Insufficient documentation

## 2014-03-11 DIAGNOSIS — I129 Hypertensive chronic kidney disease with stage 1 through stage 4 chronic kidney disease, or unspecified chronic kidney disease: Secondary | ICD-10-CM | POA: Insufficient documentation

## 2014-03-11 DIAGNOSIS — Z79899 Other long term (current) drug therapy: Secondary | ICD-10-CM | POA: Insufficient documentation

## 2014-03-11 DIAGNOSIS — I5020 Unspecified systolic (congestive) heart failure: Secondary | ICD-10-CM | POA: Diagnosis present

## 2014-03-11 DIAGNOSIS — Z720 Tobacco use: Secondary | ICD-10-CM

## 2014-03-11 NOTE — Patient Instructions (Signed)
Follow up in   Do the following things EVERYDAY: 1) Weigh yourself in the morning before breakfast. Write it down and keep it in a log. 2) Take your medicines as prescribed 3) Eat low salt foods-Limit salt (sodium) to 2000 mg per day.  4) Stay as active as you can everyday 5) Limit all fluids for the day to less than 2 liters

## 2014-03-11 NOTE — Progress Notes (Signed)
Patient ID: Patrick [name], male   DOB: 10-24-1955, 58 y.o.   MRN: [id number]  Weight Range    145-148 pounds  Baseline proBNP   993.1 on 01/19/12  PCP: [name] : Dr Zigmund Daniel  Opthalmologist: [name]  Guide IT Trial   HPI: [name] is a 58 y.o. African American male with a history of sickle cell anemia, systolic HF due to NICM EF 40% 11/2012 initially diagnosed 05/2011, no CAD by cath 05/2011, CKD stage III, legally blind, and tobacco abuse.   Admitted 11/18/12 from HF clinic with voume overload. Hemoglobin 6.7 Transfused 2 UPRBC. Diuresed with IV lasix. Lisinopril stopped due to CKD. Discharge weight 143 pounds.    Admitted 02/07/13 through 02/12/13 after being treated for N/V/D, and abdominal pain. CT of abdomen- cholelithias with plans to f/u for elective cholecystectomy. Received 2UPRBC.  Hemoglobin 6.1>7.1.  Diuretics held due to dehydration. He restarted lasix 60 mg bid 02/13/13.   Admitted to [facility] 3/4 through 08/22/13 with anemia and volume overload. Hemglobin was 5.9. Received 1U PRBC with hemoglobin increased to 6.4 Diuresed with IV lasix. Discharged on carvedilol 25 mg twice a day, lasix 80 mg twice a day, hydralazine 25 mg tid, and imdur 30 mg daily.  D/C weight was 140 pounds.  He is not on Spironolactone due to hyperkalemia. He is not on ace inhibitor due to renal failure.  Follow up for Heart Failure:  He returns for follow up. Denies SOB/PND/[facility]. Able walk around the mall without difficulty.  Weight at home 149 pounds on a talking scale. Taking medications as prescribed. Followed by [facility]. Smoking about 4 cigarettes per day.  He has been referred to Vascular for possible fistula.   01/17/12 ECHO 55%-60%  11/20/12 ECHO EF 40% Peak PA pressure 56 11/30/13 ECHO 40-45%   Labs        02/17/13: K+ 5.0, BUN 45, Creatinine 2.2, proBNP 10135       05/21/13 K 5.4 Creatinine 2.25 Pro BNP 3076        06/09/13 K 4.6 Creatinine 2.1 Pro BNP 10,727        07/07/13  Creatinine 2.06 K 5.0         08/19/13 Creatinine 2.5 Hemoglobin 5.9 Pro BNP 8266         08/27/13 K 4.5 Creatinine 2.78 Hemoglobin 7.2        09/21/13 K 4.2, creatinine 2.56         01/14/14 K 4.7, creatinine 2.29        03/08/14 K 4.8 Creatinine 2.17   SH: Lives with his cousin in [place]. Smokes 8 cigarettes per day. Occasional ETOH and no drugs  FH: Mother deceased: liver failure, no CAD        Father deceased: CAD, MI  ROS: All systems negative except as listed in HPI, PMH and Problem List.  Past Medical History  Diagnosis Date  . Swelling of extremity, right   . Swelling abdomen   . Swelling of extremity, left   . CHF (congestive heart failure)   . Blood transfusion     "I've had a bunch of them"  . Blood dyscrasia   . Exertional shortness of breath   . Legally blind   . Sickle cell disease, type SS   . [id number]     "probably weekly" (01/13/2014)  . Gout   . Chronic kidney disease (CKD), stage III (moderate)     Current Outpatient Prescriptions  Medication Sig  Dispense Refill  . allopurinol (ZYLOPRIM) 100 MG tablet TAKE 1 TABLET (100 MG TOTAL) BY MOUTH DAILY.  30 tablet  7  . aspirin EC 81 MG tablet Take 81 mg by mouth daily.      . calcitRIOL (ROCALTROL) 0.25 MCG capsule Take 0.25 mcg by mouth 3 (three) times a week. Monday, Wednesday and Friday      . carvedilol (COREG) 25 MG tablet Take 25 mg by mouth 2 (two) times daily with a meal.      . folic acid (FOLVITE) 1 MG tablet Take 1 mg by mouth daily.      . furosemide (LASIX) 40 MG tablet Take 40 mg by mouth daily.       . hydrALAZINE (APRESOLINE) 50 MG tablet Take 75 mg by mouth 3 (three) times daily.      . hydroxyurea (HYDREA) 500 MG capsule Take 1 capsule (500 mg total) by mouth daily. May take with food to minimize GI side effects.  30 capsule  3  . isosorbide mononitrate (IMDUR) 60 MG 24 hr tablet Take 1 tablet (60 mg total) by mouth daily.  30 tablet  3  . OxyCODONE HCl, Abuse Deter, 5 MG TABA Take 5 mg by  mouth every 6 (six) hours as needed.  60 tablet  0   No current facility-administered medications for this encounter.    Filed Vitals:   03/11/14 1134  BP: 130/80  Pulse: 68  Weight: 150 lb (68.04 kg)  SpO2: 99%    PHYSICAL EXAM: General:  Chronically ill appearing.  No resp difficulty  HEENT: normal except for mild scleral icterus Neck: supple. JVP 5-6 Carotids 2+ bilaterally; no bruits. No lymphadenopathy or thryomegaly appreciated. Cor: PMI normal Regular rate & rhythm. No rubs, gallops 2/6 TR and 2/6 MR Lungs: clear    Abdomen: soft, nontender, non-distended. No hepatosplenomegaly. No bruits or masses. Good bowel sounds. Extremities: no cyanosis, rash,  edema ,  + clubbing. Neuro: alert & orientedx3, cranial nerves grossly intact. Moves all 4 extremities w/o difficulty. Affect pleasant.  ASSESSMENT & PLAN:  1. Chronic Systolic Heart Failure:  NICM. Cath 05/2011 without significant coronary disease.  ECHO 11/2013 EF 40-45%. - NYHA II symptoms and volume status stable. Doing well. Will continue lasix 80 mg daily.  -  On goal coreg 25 mg twice a day.   - Continue hydralazine to 75 mg tid and continue Imdur 60 mg daily. - He is not on Ace/Arb/spiro due to hyperkalemia/CKD - Reinforced the need and importance of daily weights, a low sodium diet, and fluid restriction (less than 2 L a day). Instructed to call the HF clinic if weight increases more than 3 lbs overnight or 5 lbs in a week.  2. HTN: stable, continue current medications.  3  Sickle Cell: Follows closely in sickle cell clinic.  4. Current smoker:  Declines smoking cessation. Encouraged to stop.  5. CKD stabe IV: Continue to follow renal function closely. Followed by nephrology, Dr Lorrene Reid. Plans to see Vascular Surgeon for fistula.   Follow up in 6 weeks.  Trust Crago NP-C  03/11/2014

## 2014-03-15 ENCOUNTER — Encounter: Payer: Self-pay | Admitting: Family Medicine

## 2014-03-17 ENCOUNTER — Encounter: Payer: Self-pay | Admitting: Vascular Surgery

## 2014-03-18 ENCOUNTER — Encounter: Payer: Self-pay | Admitting: Vascular Surgery

## 2014-03-18 ENCOUNTER — Ambulatory Visit (HOSPITAL_COMMUNITY)
Admission: RE | Admit: 2014-03-18 | Discharge: 2014-03-18 | Disposition: A | Discharge status: Home / Self Care | Payer: Medicare Other | Source: Ambulatory Visit | Attending: Vascular Surgery | Admitting: Vascular Surgery

## 2014-03-18 ENCOUNTER — Ambulatory Visit (INDEPENDENT_AMBULATORY_CARE_PROVIDER_SITE_OTHER): Payer: Medicare Other | Admitting: Vascular Surgery

## 2014-03-18 VITALS — BP 160/88 | HR 75 | Ht 71.0 in | Wt 147.2 lb

## 2014-03-18 DIAGNOSIS — Z01810 Encounter for preprocedural cardiovascular examination: Secondary | ICD-10-CM | POA: Diagnosis not present

## 2014-03-18 DIAGNOSIS — N184 Chronic kidney disease, stage 4 (severe): Secondary | ICD-10-CM | POA: Insufficient documentation

## 2014-03-18 DIAGNOSIS — Z4931 Encounter for adequacy testing for hemodialysis: Secondary | ICD-10-CM | POA: Diagnosis not present

## 2014-03-18 DIAGNOSIS — N186 End stage renal disease: Secondary | ICD-10-CM | POA: Diagnosis not present

## 2014-03-18 NOTE — Progress Notes (Signed)
[facility] [place] HISTORY AND PHYSICAL   History of Present Illness:  Patient is a 58 y.o. year old male who presents for placement of a permanent hemodialysis access. The patient is left handed.  The patient is not currently on hemodialysis.  The cause of renal failure is thought to be secondary to sickle cell anemia.  Other chronic medical problems include congestive heart failure which has been stable. The patient is legally blind.  Past Medical History  Diagnosis Date  . Swelling of extremity, right   . Swelling abdomen   . Swelling of extremity, left   . CHF (congestive heart failure)   . Blood transfusion     "I've had a bunch of them"  . Blood dyscrasia   . Exertional shortness of breath   . Legally blind   . Sickle cell disease, type SS   . [id number])     "probably weekly" (01/13/2014)  . Gout   . Chronic kidney disease (CKD), stage III (moderate)     Past Surgical History  Procedure Laterality Date  . Cardiac catheterization  05/2011     Social History History  Substance Use Topics  . Smoking status: Current Every Day Smoker -- 0.50 packs/day for 41 years    Types: Cigarettes  . Smokeless tobacco: Never Used  . Alcohol Use: 1.8 oz/week    1 Glasses of wine, 2 Cans of beer per week    Family History Family History  Problem Relation Age of Onset  . Alcohol abuse Mother   . Liver disease Mother   . Cirrhosis Mother   . Heart attack Mother     Allergies  No Known Allergies   Current Outpatient Prescriptions  Medication Sig Dispense Refill  . allopurinol (ZYLOPRIM) 100 MG tablet TAKE 1 TABLET (100 MG TOTAL) BY MOUTH DAILY.  30 tablet  7  . aspirin EC 81 MG tablet Take 81 mg by mouth daily.      . calcitRIOL (ROCALTROL) 0.25 MCG capsule Take 0.25 mcg by mouth 3 (three) times a week. Monday, Wednesday and Friday      . carvedilol (COREG) 25 MG tablet Take 25 mg by mouth 2 (two) times daily with a meal.      . folic acid  (FOLVITE) 1 MG tablet Take 1 mg by mouth daily.      . furosemide (LASIX) 40 MG tablet Take 40 mg by mouth daily.       . hydrALAZINE (APRESOLINE) 50 MG tablet Take 75 mg by mouth 3 (three) times daily.      . hydroxyurea (HYDREA) 500 MG capsule Take 1 capsule (500 mg total) by mouth daily. May take with food to minimize GI side effects.  30 capsule  3  . isosorbide mononitrate (IMDUR) 60 MG 24 hr tablet Take 1 tablet (60 mg total) by mouth daily.  30 tablet  3  . OxyCODONE HCl, Abuse Deter, 5 MG TABA Take 5 mg by mouth every 6 (six) hours as needed.  60 tablet  0   No current facility-administered medications for this visit.    ROS:   General:  No weight loss, Fever, chills  HEENT: No recent headaches, no nasal bleeding, no visual changes, no sore throat  Neurologic: No dizziness, blackouts, seizures. No recent symptoms of stroke or mini- stroke. No recent episodes of slurred speech, or temporary blindness.  Cardiac: No recent episodes of chest pain/pressure, no shortness of breath at rest.  No shortness of breath  with exertion.  Denies history of atrial fibrillation or irregular heartbeat  Vascular: No history of rest pain in feet.  No history of claudication.  No history of non-healing ulcer, No history of DVT   Pulmonary: No home oxygen, no productive cough, no hemoptysis,  No asthma or wheezing  Musculoskeletal:  [ ]  Arthritis, [ ]  Low back pain,  [ ]  Joint pain  Hematologic:No history of hypercoagulable state.  No history of easy bleeding.  No history of anemia  Gastrointestinal: No hematochezia or melena,  No gastroesophageal reflux, no trouble swallowing  Urinary: [x ] chronic Kidney disease, [ ]  on HD - [ ]  MWF or [ ]  TTHS, [ ]  Burning with urination, [ ]  Frequent urination, [ ]  Difficulty urinating;   Skin: No rashes  Psychological: No history of anxiety,  No history of depression   Physical Examination  Filed Vitals:   03/18/14 1503  BP: 160/88  Pulse: 75   Height: 5\' 11"  (1.803 m)  Weight: 147 lb 3.2 oz (66.769 kg)  SpO2: 99%    Body mass index is 20.54 kg/(m^2).  General:  Alert and oriented, no acute distress HEENT: Normal Neck: No bruit or JVD Pulmonary: Clear to auscultation bilaterally Cardiac: Regular Rate and Rhythm without murmur Gastrointestinal: Soft, non-tender, non-distended, no mass, no scars Skin: No rash Extremity Pulses:  2+ radial, brachial pulses bilaterally Musculoskeletal: No deformity or edema  Neurologic: Upper and lower extremity motor 5/5 and symmetric  DATA: Patient had a vein mapping ultrasound of both upper extremities today. Cephalic vein is unusable bilaterally. The basilic vein is greater than 3 mm bilaterally from the elbow up.   ASSESSMENT:   Patient needs long-term hemodialysis access. Risks benefits possible complications procedure details types of different access were explained to the patient today. Risks include but not limited to bleeding infection fistula non-maturation ischemic steal.   PLAN:  I believe the patient is best suited for a right basilic vein transposition fistula. This is scheduled for October 20 under general anesthesia.  Ruta Hinds, MD [facility] [place] Office: [phone] Pager: [phone]

## 2014-03-19 ENCOUNTER — Other Ambulatory Visit: Payer: Self-pay

## 2014-03-31 ENCOUNTER — Encounter (HOSPITAL_COMMUNITY): Payer: Self-pay | Admitting: Pharmacy Technician

## 2014-04-01 ENCOUNTER — Encounter (HOSPITAL_COMMUNITY): Payer: Self-pay

## 2014-04-01 ENCOUNTER — Encounter (HOSPITAL_COMMUNITY)
Admission: RE | Admit: 2014-04-01 | Discharge: 2014-04-01 | Disposition: A | Discharge status: Home / Self Care | Payer: Medicare Other | Source: Ambulatory Visit | Attending: Vascular Surgery | Admitting: Vascular Surgery

## 2014-04-01 DIAGNOSIS — I472 Ventricular tachycardia: Secondary | ICD-10-CM | POA: Diagnosis not present

## 2014-04-01 DIAGNOSIS — N189 Chronic kidney disease, unspecified: Secondary | ICD-10-CM | POA: Diagnosis not present

## 2014-04-01 DIAGNOSIS — I5042 Chronic combined systolic (congestive) and diastolic (congestive) heart failure: Secondary | ICD-10-CM | POA: Insufficient documentation

## 2014-04-01 DIAGNOSIS — F17210 Nicotine dependence, cigarettes, uncomplicated: Secondary | ICD-10-CM | POA: Diagnosis not present

## 2014-04-01 DIAGNOSIS — Z01818 Encounter for other preprocedural examination: Secondary | ICD-10-CM | POA: Insufficient documentation

## 2014-04-01 LAB — BASIC METABOLIC PANEL
Anion gap: 12 (ref 5–15)
BUN: 27 mg/dL — ABNORMAL HIGH (ref 6–23)
CO2: 18 mEq/L — ABNORMAL LOW (ref 19–32)
Calcium: 8.4 mg/dL (ref 8.4–10.5)
Chloride: 105 mEq/L (ref 96–112)
Creatinine, Ser: 1.98 mg/dL — ABNORMAL HIGH (ref 0.50–1.35)
GFR calc Af Amer: 42 mL/min — ABNORMAL LOW (ref >90)
GFR calc non Af Amer: 36 mL/min — ABNORMAL LOW (ref >90)
Glucose, Bld: 85 mg/dL (ref 70–99)
Potassium: 5.2 mEq/L (ref 3.7–5.3)
Sodium: 135 mEq/L — ABNORMAL LOW (ref 137–147)

## 2014-04-01 LAB — CBC
HCT: 22.6 % — ABNORMAL LOW (ref 39.0–52.0)
Hemoglobin: 8 g/dL — ABNORMAL LOW (ref 13.0–17.0)
MCH: 37 pg — ABNORMAL HIGH (ref 26.0–34.0)
MCHC: 35.4 g/dL (ref 30.0–36.0)
MCV: 104.6 fL — ABNORMAL HIGH (ref 78.0–100.0)
Platelets: 278 10*3/uL (ref 150–400)
RBC: 2.16 MIL/uL — ABNORMAL LOW (ref 4.22–5.81)
RDW: 18.8 % — ABNORMAL HIGH (ref 11.5–15.5)
WBC: 9.5 10*3/uL (ref 4.0–10.5)

## 2014-04-01 NOTE — Progress Notes (Signed)
Anesthesia Chart Review:  Patient is a 58 year old male scheduled for right arm basilic vein transposition on 04/12/14 by Dr. Oneida Alar. Case is posted for GA.  History includes CKD felt secondary to Sickle Cell disease type SS (not yet on hemodialysis), non-ischemic CM, systolic CHF, legally blind, gout, smoking. PCP is listed as Dr. Liston Alba at [place] (next visit scheduled for 04/05/14). Cardiologist is Dr. Haroldine Laws with [facility], last visit with [name], NP on 03/11/14. He was doing well from a CHF standpoint at that time.  Amy was aware of referral to [facility] for plans for AVF creation.  Nephrologist is Dr. Lorrene Reid.  Meds: oxycodone, acetaminophen, allopurinol, ASA 81 mg, calcitriol, carvedilol, folic acid, furosemide, hydralazine, hydroxyurea, isosorbide mononitrate.  Echo on 12/01/13 showed: Compared to the prior study from 11/20/2013 there is no significant change. Left ventricle is severely dilated with moderate systolic dysfunction, LVEF 40%. Wall motion normal. No regional wall motion abnormalities. Grade 2 diastolic dysfunction. Mild mitral regurgitation directed posteriorly. Lleft atrium is severely dilated. Right ventricle is mildly dilated. Right atrium is moderately dilated. Mild tricuspid regurgitation. RVSP couldn't be estimated with poor TR tracings but dilated IVC suggestive of elevated right sided pressures.  Cardiac cath 06/11/11 showed: No angiographic CAD. Normal filling pressures.  Hemodynamics (mmHg)  RA mean 2  RV 24/3  PA 22/6  PCWP mean 8  LV 96/7  AO 100/64  Oxygen saturations:  PA 78%  AO 99%  Cardiac Output (Fick) 10.4  Cardiac Index (Fick) 5.7  High cardiac output likely due to profound chronic anemia with sickle cell disease. (Dr. Aundra Dubin)  EKG on 01/13/14 showed: SR with first degree AVB, minimal voltage criteria for LVH, may be normal variant. Non-specific ST abnormality.   CXR on 01/13/14 showed: Mild cardiac enlargement. Possible  venous hypertension. No edema or focal abnormality.  Labs from 04/01/14 noted. Cr 1.98, glucose 85, Na 135, K 5.2.  H/H 8.0/22.6, stable (primarily HGB has been in the 7 range since at least  08/2013).  He has had recent cardiology follow-up with echo.  His labs appear stable.  He is scheduled to be seen at the [facility] next week.  Unless otherwise stated in his Sickle Cell Clinic note and If no acute changes then I would anticipate that he could proceed as planned.  He will be scheduled for an ISTAT on arrival.    George Hugh [facility] Short Stay Center/Anesthesiology Phone [phone] 04/01/2014 3:42 PM

## 2014-04-01 NOTE — Progress Notes (Signed)
Primary - dr. Zigmund Daniel Cardiologist - dr. Haroldine Laws (hf clinic) ekg in July in epic echo in epic from June 2015

## 2014-04-01 NOTE — Pre-Procedure Instructions (Signed)
Patrick [name]  04/01/2014   Your procedure is scheduled on:  Tuesday, October 20th  Report to [facility] Admitting at 530 AM.  Call this number if you have problems the morning of surgery: [phone]   Remember:   Do not eat food or drink liquids after midnight.   Take these medicines the morning of surgery with A SIP OF WATER: coreg, hydralazine, imdur, tylenol or oxycodone if needed   Do not wear jewelry.  Do not wear lotions, powders, or perfumes. You may wear deodorant.  Do not shave 48 hours prior to surgery. Men may shave face and neck.  Do not bring valuables to the hospital.  [facility] is not responsible   for any belongings or valuables.               Contacts, dentures or bridgework may not be worn into surgery.  Leave suitcase in the car. After surgery it may be brought to your room.  For patients admitted to the hospital, discharge time is determined by your  treatment team.               Patients discharged the day of surgery will not be allowed to drive home.  Please read over the following fact sheets that you were given: Pain Booklet, Coughing and Deep Breathing and Surgical Site Infection Prevention [facility] - Preparing for Surgery  Before surgery, you can play an important role.  Because skin is not sterile, your skin needs to be as free of germs as possible.  You can reduce the number of germs on you skin by washing with CHG (chlorahexidine gluconate) soap before surgery.  CHG is an antiseptic cleaner which kills germs and bonds with the skin to continue killing germs even after washing.  Please DO NOT use if you have an allergy to CHG or antibacterial soaps.  If your skin becomes reddened/irritated stop using the CHG and inform your nurse when you arrive at Short Stay.  Do not shave (including legs and underarms) for at least 48 hours prior to the first CHG shower.  You may shave your face.  Please follow these instructions  carefully:   1.  Shower with CHG Soap the night before surgery and the morning of Surgery.  2.  If you choose to wash your hair, wash your hair first as usual with your normal shampoo.  3.  After you shampoo, rinse your hair and body thoroughly to remove the shampoo.  4.  Use CHG as you would any other liquid soap.  You can apply CHG directly to the skin and wash gently with scrungie or a clean washcloth.  5.  Apply the CHG Soap to your body ONLY FROM THE NECK DOWN.  Do not use on open wounds or open sores.  Avoid contact with your eyes, ears, mouth and genitals (private parts).  Wash genitals (private parts) with your normal soap.  6.  Wash thoroughly, paying special attention to the area where your surgery will be performed.  7.  Thoroughly rinse your body with warm water from the neck down.  8.  DO NOT shower/wash with your normal soap after using and rinsing off the CHG Soap.  9.  Pat yourself dry with a clean towel.            10.  Wear clean pajamas.            11.  Place clean sheets on your bed the night  of your first shower and do not sleep with pets.  Day of Surgery  Do not apply any lotions/deoderants the morning of surgery.  Please wear clean clothes to the hospital/surgery center.

## 2014-04-05 ENCOUNTER — Encounter: Payer: Self-pay | Admitting: Family Medicine

## 2014-04-05 ENCOUNTER — Ambulatory Visit (INDEPENDENT_AMBULATORY_CARE_PROVIDER_SITE_OTHER): Payer: Medicare Other | Admitting: Family Medicine

## 2014-04-05 ENCOUNTER — Encounter (HOSPITAL_COMMUNITY): Payer: Self-pay | Admitting: Emergency Medicine

## 2014-04-05 ENCOUNTER — Emergency Department (HOSPITAL_COMMUNITY)
Admission: EM | Admit: 2014-04-05 | Discharge: 2014-04-05 | Discharge status: Against Medical Advice | Payer: Medicare Other | Attending: Emergency Medicine | Admitting: Emergency Medicine

## 2014-04-05 VITALS — BP 135/74 | HR 74 | Temp 97.6°F | Resp 18 | Ht 71.0 in | Wt 152.0 lb

## 2014-04-05 DIAGNOSIS — N184 Chronic kidney disease, stage 4 (severe): Secondary | ICD-10-CM

## 2014-04-05 DIAGNOSIS — G4485 Primary stabbing headache: Secondary | ICD-10-CM

## 2014-04-05 DIAGNOSIS — R519 Headache, unspecified: Secondary | ICD-10-CM

## 2014-04-05 DIAGNOSIS — R51 Headache: Secondary | ICD-10-CM | POA: Insufficient documentation

## 2014-04-05 DIAGNOSIS — N183 Chronic kidney disease, stage 3 (moderate): Secondary | ICD-10-CM | POA: Insufficient documentation

## 2014-04-05 DIAGNOSIS — H548 Legal blindness, as defined in USA: Secondary | ICD-10-CM | POA: Insufficient documentation

## 2014-04-05 DIAGNOSIS — Z720 Tobacco use: Secondary | ICD-10-CM | POA: Diagnosis not present

## 2014-04-05 DIAGNOSIS — I509 Heart failure, unspecified: Secondary | ICD-10-CM | POA: Diagnosis not present

## 2014-04-05 NOTE — Progress Notes (Signed)
Subjective:    Patient ID: Patrick [name], male    DOB: 1956/02/17, 58 y.o.   MRN: [id number]  HPI Mr. Yacko is a 58 year old patient with a history of sickle cell anemia, HbSS presents with a severe headache behind the right eye. He states that headache is increasing in severity and is currently described as throbbing, occasionally stabbing, and constant, 10/10 localized right sided head pain. Mr. Crome stated that he has taken 12 Tylenol over the past 8 hours. Mr. Tenorio states that this headache is different from any that he has experienced in the past. He denies fever, shortness of breath, chest pains, nausea, vomiting, or diarrhea.  Description of Headaches: Location of pain: right-sided unilateral Radiation of pain?:temporal, frontal Character of pain:boring and throbbing Severity of pain: 10 Rapidity of onset: Starts slow and worsens Degree of Functional Impairment: severe  Current Use of Meds to Treat HA: Abortive meds? Patient denies a history of migraine headaches Daily use? no Prophylactic meds? Tylenol  Additional Relevant History: History of head/neck trauma? No History of head/neck surgery? No Family h/o headache problems? Reports headaches in the past, but this headache feels different Tylenol Patient reports that he has had a total of 12 Tylenol today without relief.   Past Medical History  Diagnosis Date  . Swelling of extremity, right   . Swelling abdomen   . Swelling of extremity, left   . Blood transfusion     "I've had a bunch of them"  . Blood dyscrasia   . Legally blind   . Sickle cell disease, type SS   . [id number]     "probably weekly" (01/13/2014)  . Gout   . Chronic kidney disease (CKD), stage III (moderate)   . CHF (congestive heart failure)     followed by hf clinic   Review of Systems  Constitutional: Positive for fatigue.  Eyes: Positive for pain (pain behind right eye) and visual disturbance (Patient legally blind).  Negative for photophobia, discharge, redness and itching.  Respiratory: Negative.   Cardiovascular: Negative.  Negative for palpitations.  Gastrointestinal: Negative.   Musculoskeletal: Negative.   Skin: Negative.   Allergic/Immunologic: Negative for environmental allergies, food allergies and immunocompromised state.  Neurological: Positive for seizures and headaches. Negative for dizziness, tremors, syncope, light-headedness and numbness.  Psychiatric/Behavioral: Negative.        Objective:   Physical Exam  Constitutional: He is oriented to person, place, and time. He appears well-developed and well-nourished. He appears lethargic. He appears distressed.  HENT:  Head: Normocephalic and atraumatic.  Right Ear: External ear normal.  Left Ear: External ear normal.  Mouth/Throat: Oropharynx is clear and moist.  Eyes: Conjunctivae are normal. Pupils are equal, round, and reactive to light. Lids are everted and swept, no foreign bodies found.  Cardiovascular: Normal rate, regular rhythm, normal heart sounds and intact distal pulses.   Pulmonary/Chest: Effort normal and breath sounds normal.  Abdominal: Soft. Bowel sounds are normal. He exhibits no distension.  Musculoskeletal: Normal range of motion.  Neurological: He is oriented to person, place, and time. He has normal strength and normal reflexes. He appears lethargic. He displays normal reflexes. No sensory deficit. He exhibits normal muscle tone. He displays a negative Romberg sign. Coordination and gait normal.  Patient unable to complete cardinal fields of gaze due to increased headache pain  Skin: Skin is warm and dry.  Psychiatric: His behavior is normal. Judgment and thought content normal. His mood appears anxious.  BP 135/74  Pulse 74  Temp(Src) 97.6 F (36.4 C)  Resp 18  Ht 5\' 11"  (1.803 m)  Wt 152 lb (68.947 kg)  BMI 21.21 kg/m2 Assessment & Plan:  1. Right-sided headache Patient was lying in the room with  his hands covering his face prior to physical examination. He states that this headache is "off the charts". He has taken 12 Tylenol in less than 24 hours without relief. He describes headache as throbbing, sharp, and constant. He denies photophobia, phonophobia, weakness, numbness and tingling to extremities. Recommend that patient report to the emergency department for emergency care.   2. Severe headache Unrelieved by resting in a dark room and Tylenol.   3. Primary stabbing headache  4. CKD (chronic kidney disease), stage IV Stable; I note some minor lab abnormalities that have been stable over time.  5. Tobacco abuse Mr. Marcellus states that he smoked earlier today, but smoking decreased due to headache.     Dorena Dew, FNP

## 2014-04-05 NOTE — ED Notes (Signed)
Pt reports severe h/a since last week.  Denies n/v at this time.

## 2014-04-11 MED ORDER — DEXTROSE 5 % IV SOLN
1.5000 g | INTRAVENOUS | Status: AC
  Administered 2014-04-12: 1.5 g via INTRAVENOUS
  Filled 2014-04-11: qty 1.5

## 2014-04-12 ENCOUNTER — Other Ambulatory Visit: Payer: Self-pay | Admitting: *Deleted

## 2014-04-12 ENCOUNTER — Telehealth: Payer: Self-pay | Admitting: Vascular Surgery

## 2014-04-12 ENCOUNTER — Ambulatory Visit (HOSPITAL_COMMUNITY)
Admission: RE | Admit: 2014-04-12 | Discharge: 2014-04-12 | Disposition: A | Discharge status: Home / Self Care | Payer: Medicare Other | Source: Ambulatory Visit | Attending: Vascular Surgery | Admitting: Vascular Surgery

## 2014-04-12 ENCOUNTER — Encounter (HOSPITAL_COMMUNITY)
Admission: RE | Disposition: A | Discharge status: Home / Self Care | Payer: Self-pay | Source: Ambulatory Visit | Attending: Vascular Surgery

## 2014-04-12 ENCOUNTER — Encounter (HOSPITAL_COMMUNITY): Payer: Self-pay | Admitting: *Deleted

## 2014-04-12 ENCOUNTER — Ambulatory Visit (HOSPITAL_COMMUNITY): Payer: Medicare Other | Admitting: Anesthesiology

## 2014-04-12 ENCOUNTER — Encounter (HOSPITAL_COMMUNITY): Payer: Medicare Other | Admitting: Vascular Surgery

## 2014-04-12 DIAGNOSIS — F17210 Nicotine dependence, cigarettes, uncomplicated: Secondary | ICD-10-CM | POA: Diagnosis not present

## 2014-04-12 DIAGNOSIS — N186 End stage renal disease: Secondary | ICD-10-CM | POA: Diagnosis not present

## 2014-04-12 DIAGNOSIS — D571 Sickle-cell disease without crisis: Secondary | ICD-10-CM | POA: Diagnosis not present

## 2014-04-12 DIAGNOSIS — H548 Legal blindness, as defined in USA: Secondary | ICD-10-CM | POA: Diagnosis not present

## 2014-04-12 DIAGNOSIS — I509 Heart failure, unspecified: Secondary | ICD-10-CM | POA: Diagnosis not present

## 2014-04-12 DIAGNOSIS — N185 Chronic kidney disease, stage 5: Secondary | ICD-10-CM

## 2014-04-12 DIAGNOSIS — I120 Hypertensive chronic kidney disease with stage 5 chronic kidney disease or end stage renal disease: Secondary | ICD-10-CM | POA: Diagnosis not present

## 2014-04-12 DIAGNOSIS — N184 Chronic kidney disease, stage 4 (severe): Secondary | ICD-10-CM

## 2014-04-12 DIAGNOSIS — Z4931 Encounter for adequacy testing for hemodialysis: Secondary | ICD-10-CM

## 2014-04-12 HISTORY — PX: BASCILIC VEIN TRANSPOSITION: SHX5742

## 2014-04-12 LAB — POCT I-STAT 4, (NA,K, GLUC, HGB,HCT)
Glucose, Bld: 93 mg/dL (ref 70–99)
HCT: 26 % — ABNORMAL LOW (ref 39.0–52.0)
Hemoglobin: 8.8 g/dL — ABNORMAL LOW (ref 13.0–17.0)
Potassium: 4.5 mEq/L (ref 3.7–5.3)
Sodium: 139 mEq/L (ref 137–147)

## 2014-04-12 SURGERY — TRANSPOSITION, VEIN, BASILIC
Anesthesia: Monitor Anesthesia Care | Site: Arm Upper | Laterality: Right

## 2014-04-12 MED ORDER — ARTIFICIAL TEARS OP OINT
TOPICAL_OINTMENT | OPHTHALMIC | Status: AC
  Filled 2014-04-12: qty 3.5

## 2014-04-12 MED ORDER — ONDANSETRON HCL 4 MG/2ML IJ SOLN
INTRAMUSCULAR | Status: DC | PRN
  Administered 2014-04-12: 4 mg via INTRAVENOUS

## 2014-04-12 MED ORDER — SUCCINYLCHOLINE CHLORIDE 20 MG/ML IJ SOLN
INTRAMUSCULAR | Status: DC | PRN
  Administered 2014-04-12: 90 mg via INTRAVENOUS

## 2014-04-12 MED ORDER — PROPOFOL 10 MG/ML IV BOLUS
INTRAVENOUS | Status: AC
  Filled 2014-04-12: qty 20

## 2014-04-12 MED ORDER — ARTIFICIAL TEARS OP OINT
TOPICAL_OINTMENT | OPHTHALMIC | Status: DC | PRN
  Administered 2014-04-12: 1 via OPHTHALMIC

## 2014-04-12 MED ORDER — SODIUM CHLORIDE 0.9 % IV SOLN
INTRAVENOUS | Status: DC | PRN
  Administered 2014-04-12: 07:00:00 via INTRAVENOUS

## 2014-04-12 MED ORDER — PHENYLEPHRINE HCL 10 MG/ML IJ SOLN
10.0000 mg | INTRAVENOUS | Status: DC | PRN
  Administered 2014-04-12: 15 ug/min via INTRAVENOUS

## 2014-04-12 MED ORDER — OXYCODONE HCL 5 MG PO TABS: 5.0000 mg | ORAL_TABLET | Freq: Four times a day (QID) | ORAL | Status: DC | PRN

## 2014-04-12 MED ORDER — LIDOCAINE HCL (CARDIAC) 20 MG/ML IV SOLN
INTRAVENOUS | Status: DC | PRN
  Administered 2014-04-12: 80 mg via INTRAVENOUS

## 2014-04-12 MED ORDER — CARVEDILOL 25 MG PO TABS
25.0000 mg | ORAL_TABLET | Freq: Once | ORAL | Status: AC
  Administered 2014-04-12: 25 mg via ORAL
  Filled 2014-04-12: qty 1

## 2014-04-12 MED ORDER — EPHEDRINE SULFATE 50 MG/ML IJ SOLN
INTRAMUSCULAR | Status: AC
  Filled 2014-04-12: qty 1

## 2014-04-12 MED ORDER — FENTANYL CITRATE 0.05 MG/ML IJ SOLN
INTRAMUSCULAR | Status: AC
  Filled 2014-04-12: qty 5

## 2014-04-12 MED ORDER — HEPARIN SODIUM (PORCINE) 1000 UNIT/ML IJ SOLN
INTRAMUSCULAR | Status: DC | PRN
  Administered 2014-04-12: 5000 [IU] via INTRAVENOUS

## 2014-04-12 MED ORDER — CHLORHEXIDINE GLUCONATE CLOTH 2 % EX PADS: 6.0000 | MEDICATED_PAD | Freq: Once | CUTANEOUS | Status: DC

## 2014-04-12 MED ORDER — EPHEDRINE SULFATE 50 MG/ML IJ SOLN
INTRAMUSCULAR | Status: DC | PRN
  Administered 2014-04-12: 5 mg via INTRAVENOUS
  Administered 2014-04-12: 10 mg via INTRAVENOUS
  Administered 2014-04-12: 5 mg via INTRAVENOUS
  Administered 2014-04-12: 10 mg via INTRAVENOUS

## 2014-04-12 MED ORDER — 0.9 % SODIUM CHLORIDE (POUR BTL) OPTIME
TOPICAL | Status: DC | PRN
  Administered 2014-04-12: 1000 mL

## 2014-04-12 MED ORDER — ONDANSETRON HCL 4 MG/2ML IJ SOLN
INTRAMUSCULAR | Status: AC
  Filled 2014-04-12: qty 2

## 2014-04-12 MED ORDER — LIDOCAINE HCL (CARDIAC) 20 MG/ML IV SOLN
INTRAVENOUS | Status: AC
  Filled 2014-04-12: qty 10

## 2014-04-12 MED ORDER — SODIUM CHLORIDE 0.9 % IJ SOLN
INTRAMUSCULAR | Status: AC
  Filled 2014-04-12: qty 10

## 2014-04-12 MED ORDER — PROTAMINE SULFATE 10 MG/ML IV SOLN
INTRAVENOUS | Status: AC
  Filled 2014-04-12: qty 5

## 2014-04-12 MED ORDER — HYDROMORPHONE HCL 1 MG/ML IJ SOLN
INTRAMUSCULAR | Status: AC
  Filled 2014-04-12: qty 1

## 2014-04-12 MED ORDER — CARVEDILOL 12.5 MG PO TABS
ORAL_TABLET | ORAL | Status: AC
  Filled 2014-04-12: qty 2

## 2014-04-12 MED ORDER — ONDANSETRON HCL 4 MG/2ML IJ SOLN: 4.0000 mg | Freq: Once | INTRAMUSCULAR | Status: DC | PRN

## 2014-04-12 MED ORDER — PHENYLEPHRINE 40 MCG/ML (10ML) SYRINGE FOR IV PUSH (FOR BLOOD PRESSURE SUPPORT)
PREFILLED_SYRINGE | INTRAVENOUS | Status: AC
  Filled 2014-04-12: qty 10

## 2014-04-12 MED ORDER — LIDOCAINE HCL (PF) 1 % IJ SOLN
INTRAMUSCULAR | Status: AC
  Filled 2014-04-12: qty 30

## 2014-04-12 MED ORDER — HYDROMORPHONE HCL 1 MG/ML IJ SOLN
0.2500 mg | INTRAMUSCULAR | Status: DC | PRN
  Administered 2014-04-12 (×2): 0.25 mg via INTRAVENOUS
  Administered 2014-04-12 (×2): 0.5 mg via INTRAVENOUS

## 2014-04-12 MED ORDER — PROPOFOL 10 MG/ML IV BOLUS
INTRAVENOUS | Status: DC | PRN
  Administered 2014-04-12: 200 mg via INTRAVENOUS

## 2014-04-12 MED ORDER — SUCCINYLCHOLINE CHLORIDE 20 MG/ML IJ SOLN
INTRAMUSCULAR | Status: AC
  Filled 2014-04-12: qty 1

## 2014-04-12 MED ORDER — THROMBIN 20000 UNITS EX SOLR
CUTANEOUS | Status: AC
  Filled 2014-04-12: qty 20000

## 2014-04-12 MED ORDER — FENTANYL CITRATE 0.05 MG/ML IJ SOLN
INTRAMUSCULAR | Status: DC | PRN
  Administered 2014-04-12: 50 ug via INTRAVENOUS
  Administered 2014-04-12: 25 ug via INTRAVENOUS
  Administered 2014-04-12 (×3): 50 ug via INTRAVENOUS
  Administered 2014-04-12: 25 ug via INTRAVENOUS
  Administered 2014-04-12: 50 ug via INTRAVENOUS

## 2014-04-12 MED ORDER — MIDAZOLAM HCL 2 MG/2ML IJ SOLN
INTRAMUSCULAR | Status: AC
  Filled 2014-04-12: qty 2

## 2014-04-12 MED ORDER — HEPARIN SODIUM (PORCINE) 5000 UNIT/ML IJ SOLN
INTRAMUSCULAR | Status: DC | PRN
  Administered 2014-04-12: 08:00:00

## 2014-04-12 MED ORDER — PROTAMINE SULFATE 10 MG/ML IV SOLN
INTRAVENOUS | Status: DC | PRN
  Administered 2014-04-12 (×5): 10 mg via INTRAVENOUS

## 2014-04-12 MED ORDER — SODIUM CHLORIDE 0.9 % IV SOLN: INTRAVENOUS | Status: DC

## 2014-04-12 SURGICAL SUPPLY — 41 items
CANISTER SUCTION 2500CC (MISCELLANEOUS) ×2 IMPLANT
CANNULA VESSEL 3MM 2 BLNT TIP (CANNULA) ×2 IMPLANT
CLIP TI MEDIUM 24 (CLIP) ×2 IMPLANT
CLIP TI WIDE RED SMALL 24 (CLIP) ×2 IMPLANT
COVER PROBE W GEL 5X96 (DRAPES) IMPLANT
COVER SURGICAL LIGHT HANDLE (MISCELLANEOUS) ×2 IMPLANT
DECANTER SPIKE VIAL GLASS SM (MISCELLANEOUS) ×2 IMPLANT
DERMABOND ADVANCED (GAUZE/BANDAGES/DRESSINGS) ×2
DERMABOND ADVANCED .7 DNX12 (GAUZE/BANDAGES/DRESSINGS) ×2 IMPLANT
ELECT REM PT RETURN 9FT ADLT (ELECTROSURGICAL) ×2
ELECTRODE REM PT RTRN 9FT ADLT (ELECTROSURGICAL) ×1 IMPLANT
GEL ULTRASOUND 20GR AQUASONIC (MISCELLANEOUS) IMPLANT
GLOVE BIO SURGEON STRL SZ 6.5 (GLOVE) ×4 IMPLANT
GLOVE BIO SURGEON STRL SZ7.5 (GLOVE) ×4 IMPLANT
GLOVE BIOGEL PI IND STRL 6.5 (GLOVE) ×1 IMPLANT
GLOVE BIOGEL PI INDICATOR 6.5 (GLOVE) ×1
GLOVE SURG SS PI 7.0 STRL IVOR (GLOVE) ×4 IMPLANT
GOWN STRL REUS W/ TWL LRG LVL3 (GOWN DISPOSABLE) ×2 IMPLANT
GOWN STRL REUS W/ TWL XL LVL3 (GOWN DISPOSABLE) ×2 IMPLANT
GOWN STRL REUS W/TWL LRG LVL3 (GOWN DISPOSABLE) ×2
GOWN STRL REUS W/TWL XL LVL3 (GOWN DISPOSABLE) ×2
KIT BASIN OR (CUSTOM PROCEDURE TRAY) ×2 IMPLANT
KIT ROOM TURNOVER OR (KITS) ×2 IMPLANT
LOOP VESSEL MINI RED (MISCELLANEOUS) IMPLANT
NS IRRIG 1000ML POUR BTL (IV SOLUTION) ×2 IMPLANT
PACK CV ACCESS (CUSTOM PROCEDURE TRAY) ×2 IMPLANT
PAD ARMBOARD 7.5X6 YLW CONV (MISCELLANEOUS) ×4 IMPLANT
PROBE PENCIL 8 MHZ STRL DISP (MISCELLANEOUS) IMPLANT
SPONGE SURGIFOAM ABS GEL 100 (HEMOSTASIS) IMPLANT
SUT PROLENE 7 0 BV 1 (SUTURE) ×2 IMPLANT
SUT SILK 2 0 SH (SUTURE) ×2 IMPLANT
SUT SILK 3 0 (SUTURE) ×3
SUT SILK 3-0 18XBRD TIE 12 (SUTURE) ×3 IMPLANT
SUT VIC AB 3-0 SH 27 (SUTURE) ×4
SUT VIC AB 3-0 SH 27X BRD (SUTURE) ×4 IMPLANT
SUT VICRYL 4-0 PS2 18IN ABS (SUTURE) ×10 IMPLANT
TAPE UMBILICAL COTTON 1/8X30 (MISCELLANEOUS) ×2 IMPLANT
TOWEL OR 17X24 6PK STRL BLUE (TOWEL DISPOSABLE) ×2 IMPLANT
TOWEL OR 17X26 10 PK STRL BLUE (TOWEL DISPOSABLE) ×2 IMPLANT
UNDERPAD 30X30 INCONTINENT (UNDERPADS AND DIAPERS) ×2 IMPLANT
WATER STERILE IRR 1000ML POUR (IV SOLUTION) ×2 IMPLANT

## 2014-04-12 NOTE — H&P (View-Only) (Signed)
[facility] [place] HISTORY AND PHYSICAL   History of Present Illness:  Patient is a 58 y.o. year old male who presents for placement of a permanent hemodialysis access. The patient is left handed.  The patient is not currently on hemodialysis.  The cause of renal failure is thought to be secondary to sickle cell anemia.  Other chronic medical problems include congestive heart failure which has been stable. The patient is legally blind.  Past Medical History  Diagnosis Date  . Swelling of extremity, right   . Swelling abdomen   . Swelling of extremity, left   . CHF (congestive heart failure)   . Blood transfusion     "I've had a bunch of them"  . Blood dyscrasia   . Exertional shortness of breath   . Legally blind   . Sickle cell disease, type SS   . [id number])     "probably weekly" (01/13/2014)  . Gout   . Chronic kidney disease (CKD), stage III (moderate)     Past Surgical History  Procedure Laterality Date  . Cardiac catheterization  05/2011     Social History History  Substance Use Topics  . Smoking status: Current Every Day Smoker -- 0.50 packs/day for 41 years    Types: Cigarettes  . Smokeless tobacco: Never Used  . Alcohol Use: 1.8 oz/week    1 Glasses of wine, 2 Cans of beer per week    Family History Family History  Problem Relation Age of Onset  . Alcohol abuse Mother   . Liver disease Mother   . Cirrhosis Mother   . Heart attack Mother     Allergies  No Known Allergies   Current Outpatient Prescriptions  Medication Sig Dispense Refill  . allopurinol (ZYLOPRIM) 100 MG tablet TAKE 1 TABLET (100 MG TOTAL) BY MOUTH DAILY.  30 tablet  7  . aspirin EC 81 MG tablet Take 81 mg by mouth daily.      . calcitRIOL (ROCALTROL) 0.25 MCG capsule Take 0.25 mcg by mouth 3 (three) times a week. Monday, Wednesday and Friday      . carvedilol (COREG) 25 MG tablet Take 25 mg by mouth 2 (two) times daily with a meal.      . folic acid  (FOLVITE) 1 MG tablet Take 1 mg by mouth daily.      . furosemide (LASIX) 40 MG tablet Take 40 mg by mouth daily.       . hydrALAZINE (APRESOLINE) 50 MG tablet Take 75 mg by mouth 3 (three) times daily.      . hydroxyurea (HYDREA) 500 MG capsule Take 1 capsule (500 mg total) by mouth daily. May take with food to minimize GI side effects.  30 capsule  3  . isosorbide mononitrate (IMDUR) 60 MG 24 hr tablet Take 1 tablet (60 mg total) by mouth daily.  30 tablet  3  . OxyCODONE HCl, Abuse Deter, 5 MG TABA Take 5 mg by mouth every 6 (six) hours as needed.  60 tablet  0   No current facility-administered medications for this visit.    ROS:   General:  No weight loss, Fever, chills  HEENT: No recent headaches, no nasal bleeding, no visual changes, no sore throat  Neurologic: No dizziness, blackouts, seizures. No recent symptoms of stroke or mini- stroke. No recent episodes of slurred speech, or temporary blindness.  Cardiac: No recent episodes of chest pain/pressure, no shortness of breath at rest.  No shortness of breath  with exertion.  Denies history of atrial fibrillation or irregular heartbeat  Vascular: No history of rest pain in feet.  No history of claudication.  No history of non-healing ulcer, No history of DVT   Pulmonary: No home oxygen, no productive cough, no hemoptysis,  No asthma or wheezing  Musculoskeletal:  [ ]  Arthritis, [ ]  Low back pain,  [ ]  Joint pain  Hematologic:No history of hypercoagulable state.  No history of easy bleeding.  No history of anemia  Gastrointestinal: No hematochezia or melena,  No gastroesophageal reflux, no trouble swallowing  Urinary: [x ] chronic Kidney disease, [ ]  on HD - [ ]  MWF or [ ]  TTHS, [ ]  Burning with urination, [ ]  Frequent urination, [ ]  Difficulty urinating;   Skin: No rashes  Psychological: No history of anxiety,  No history of depression   Physical Examination  Filed Vitals:   03/18/14 1503  BP: 160/88  Pulse: 75   Height: 5\' 11"  (1.803 m)  Weight: 147 lb 3.2 oz (66.769 kg)  SpO2: 99%    Body mass index is 20.54 kg/(m^2).  General:  Alert and oriented, no acute distress HEENT: Normal Neck: No bruit or JVD Pulmonary: Clear to auscultation bilaterally Cardiac: Regular Rate and Rhythm without murmur Gastrointestinal: Soft, non-tender, non-distended, no mass, no scars Skin: No rash Extremity Pulses:  2+ radial, brachial pulses bilaterally Musculoskeletal: No deformity or edema  Neurologic: Upper and lower extremity motor 5/5 and symmetric  DATA: Patient had a vein mapping ultrasound of both upper extremities today. Cephalic vein is unusable bilaterally. The basilic vein is greater than 3 mm bilaterally from the elbow up.   ASSESSMENT:   Patient needs long-term hemodialysis access. Risks benefits possible complications procedure details types of different access were explained to the patient today. Risks include but not limited to bleeding infection fistula non-maturation ischemic steal.   PLAN:  I believe the patient is best suited for a right basilic vein transposition fistula. This is scheduled for October 20 under general anesthesia.  Ruta Hinds, MD [facility] [place] Office: [phone] Pager: [phone]

## 2014-04-12 NOTE — Transfer of Care (Signed)
Immediate Anesthesia Transfer of Care Note  Patient: Patrick [name]  Procedure(s) Performed: Procedure(s): BASILIC VEIN TRANSPOSITION (Right)  Patient Location: PACU  Anesthesia Type:General  Level of Consciousness: awake and alert   Airway & Oxygen Therapy: Patient Spontanous Breathing and Patient connected to nasal cannula oxygen  Post-op Assessment: Report given to PACU RN, Post -op Vital signs reviewed and stable and Patient moving all extremities  Post vital signs: Reviewed and stable  Complications: No apparent anesthesia complications

## 2014-04-12 NOTE — Anesthesia Postprocedure Evaluation (Signed)
  Anesthesia Post-op Note  Patient: Patrick [name]  Procedure(s) Performed: Procedure(s): BASILIC VEIN TRANSPOSITION (Right)  Patient Location: PACU  Anesthesia Type: MAC, General   Level of Consciousness: awake, alert  and oriented  Airway and Oxygen Therapy: Patient Spontanous Breathing  Post-op Pain: mild  Post-op Assessment: Post-op Vital signs reviewed  Post-op Vital Signs: Reviewed  Last Vitals:  Filed Vitals:   04/12/14 1114  BP:   Pulse:   Temp: 36.7 C  Resp:     Complications: No apparent anesthesia complications

## 2014-04-12 NOTE — Interval H&P Note (Signed)
History and Physical Interval Note:  04/12/2014 7:30 AM  Patrick [name]  has presented today for surgery, with the diagnosis of End stage kidney disease  The various methods of treatment have been discussed with the patient and family. After consideration of risks, benefits and other options for treatment, the patient has consented to  Procedure(s): BASILIC VEIN TRANSPOSITION (Right) as a surgical intervention .  The patient's history has been reviewed, patient examined, no change in status, stable for surgery.  I have reviewed the patient's chart and labs.  Questions were answered to the patient's satisfaction.     Naif Alabi E

## 2014-04-12 NOTE — Progress Notes (Signed)
Called Dr.Crews for sign out

## 2014-04-12 NOTE — Discharge Instructions (Signed)
What to eat:  For your first meals, you should eat lightly; only small meals initially.  If you do not have nausea, you may eat larger meals.  Avoid spicy, greasy and heavy food.    General Anesthesia, Adult, Care After  Refer to this sheet in the next few weeks. These instructions provide you with information on caring for yourself after your procedure. Your health care provider may also give you more specific instructions. Your treatment has been planned according to current medical practices, but problems sometimes occur. Call your health care provider if you have any problems or questions after your procedure.  WHAT TO EXPECT AFTER THE PROCEDURE  After the procedure, it is typical to experience:  Sleepiness.  Nausea and vomiting. HOME CARE INSTRUCTIONS  For the first 24 hours after general anesthesia:  Have a responsible person with you.  Do not drive a car. If you are alone, do not take public transportation.  Do not drink alcohol.  Do not take medicine that has not been prescribed by your health care provider.  Do not sign important papers or make important decisions.  You may resume a normal diet and activities as directed by your health care provider.  Change bandages (dressings) as directed.  If you have questions or problems that seem related to general anesthesia, call the hospital and ask for the anesthetist or anesthesiologist on call. SEEK MEDICAL CARE IF:  You have nausea and vomiting that continue the day after anesthesia.  You develop a rash. SEEK IMMEDIATE MEDICAL CARE IF:  You have difficulty breathing.  You have chest pain.  You have any allergic problems. Document Released: 09/10/2000 Document Revised: 02/04/2013 Document Reviewed: 12/18/2012  [facility] Patient Information 2014 [place], [place].      04/12/2014 Tedrick NEUMAN JIAN [id number] 1956-01-23  Surgeon(s): Elam Dutch, MD  Procedure(s): BASILIC VEIN TRANSPOSITION-right arm  x Do not stick  graft for 12 weeks

## 2014-04-12 NOTE — Op Note (Signed)
Procedure: Right basilic vein transposition fistula  Preoperative diagnosis: End-stage renal disease  Postoperative diagnosis: Same  Anesthesia: Gen.  Assistant: Leontine Locket, PA-C  Operative findings: 3-5 mm right basilic vein, 3 mm left brachial artery  Operative details: After obtaining informed consent, the patient was taken to the operating room. The patient was placed in supine position operating table. After induction of general anesthesia, the patient's entire right upper extremity was prepped and draped in the usual sterile fashion. Next ultrasound was used to identify the right basilic vein. A longitudinal incision was made just above the antecubital crease in order to expose the basilic vein. The vein was of good quality approximately 3-5 mm in diameter throughout its course. Several longitudinal skip incisions were made up the arm from just below the antecubital crease all the way up to the axilla to harvest the basilic vein. Care was taken to try to not injure any sensory nerves and all the motor nerves were identified and protected. The vein was dissected free circumferentially and small side branches ligated and divided between silk ties or clips. Next the brachial artery was exposed by deepening the basilic vein harvest incision just above the antecubital crease. The artery was approximately 3 mm in diameter. This was dissected free circumferentially. Vessel loops were placed around it. There was some spasm within the artery after dissecting it free. Next the distal basilic vein was ligated with a 2 0 silk tie and the vein transected. The vein was brought out throughout the skip incisions and gently distended with heparinized saline and marked for orientation. The vein was then tunneled subcutaneously in an arcing configuration out over the biceps muscle down to the level of the exposed brachial artery just above the antecubital crease. The patient was given 5000 units of intravenous  heparin. Vessel loops were used to control the artery proximally and distally. The vein was cut to length and sewn end of vein to side of artery using a running 7-0 Prolene suture. Just prior completion of the anastomosis, it was forebled backbled and thoroughly flushed. The anastomosis was secured Vesseloops were released.  There was a palpable thrill immediately. There was also good Doppler flow throughout the course of the fistula. The distal vein was also noted to fill fully. At this point all subcutaneous tissues were reapproximated using running 3-0 Vicryl suture. All skin incisions were closed with running 4  0 Vicryl subcuticular stitch. Dermabond was applied to all incisions. The patient tolerated the procedure well and there were no complications. Instrument sponge needle counts were correct at the end of the case. The patient was taken to the recovery room in stable condition.  Ruta Hinds, MD [facility] [place] Office: [phone] Pager: [phone]

## 2014-04-12 NOTE — Telephone Encounter (Addendum)
Message copied by Gena Fray on Mon Apr 12, 2014  3:24 PM ------      Message from: Mena Goes      Created: Mon Apr 12, 2014 11:08 AM      Regarding: Schedule                   ----- Message -----         From: Gabriel Earing, PA-C         Sent: 04/12/2014  11:04 AM           To: Vvs Charge Pool            S/p right BVT 04/12/14.  F/u with Dr. Oneida Alar in 4-6 weeks with duplex.            Thanks!      Samantha ------  04/12/14: phone # is disconnected, mailed letter, dpm

## 2014-04-12 NOTE — Anesthesia Preprocedure Evaluation (Addendum)
Anesthesia Evaluation  Patient identified by MRN, date of birth, ID band Patient awake    Reviewed: Allergy & Precautions, H&P , NPO status , Patient's Chart, lab work & pertinent test results  Airway Mallampati: II TM Distance: >3 FB Neck ROM: Full    Dental  (+) Teeth Intact, Dental Advisory Given   Pulmonary Current Smoker,          Cardiovascular hypertension, +CHF     Neuro/Psych  Headaches,    GI/Hepatic   Endo/Other    Renal/GU Dialysis and ESRFRenal disease     Musculoskeletal   Abdominal   Peds  Hematology  (+) Sickle cell anemia and anemia ,   Anesthesia Other Findings   Reproductive/Obstetrics                          Anesthesia Physical Anesthesia Plan  ASA: IV  Anesthesia Plan: MAC and General   Post-op Pain Management:    Induction: Intravenous  Airway Management Planned: Mask and Oral ETT  Additional Equipment:   Intra-op Plan:   Post-operative Plan: Extubation in OR  Informed Consent: I have reviewed the patients History and Physical, chart, labs and discussed the procedure including the risks, benefits and alternatives for the proposed anesthesia with the patient or authorized representative who has indicated his/her understanding and acceptance.     Plan Discussed with: CRNA and Surgeon  Anesthesia Plan Comments:         Anesthesia Quick Evaluation

## 2014-04-13 ENCOUNTER — Encounter (HOSPITAL_COMMUNITY): Payer: Self-pay | Admitting: Vascular Surgery

## 2014-04-19 ENCOUNTER — Telehealth: Payer: Self-pay

## 2014-04-19 NOTE — Telephone Encounter (Signed)
Phone call from pt.  Reported continued swelling in the right upper extremity, from fingers to above the elbow.  Reported he has been elevating above the level of the heart.  Stated he noticed his fingers "feel a little better", than they did initially.  Stated he has "a little numbness in his (R) ring and little finger."  Denies any temperature change in right hand/fingers; reported they feel warm.  Denies any opening in the incision; denies redness/ warmth of the peri-incisional area.  Denies fever/ chills.  Encouraged to continue to elevate right upper extrem., @ intervals during day, above level of heart.  Encouraged to do active range of motion with his fingers.  Encouraged to continue to monitor, since he is just one week post-op, and call office if symptoms don't improve.  Verb. Understanding.  Agrees with plan.

## 2014-04-26 DIAGNOSIS — N184 Chronic kidney disease, stage 4 (severe): Secondary | ICD-10-CM | POA: Diagnosis not present

## 2014-04-26 DIAGNOSIS — D649 Anemia, unspecified: Secondary | ICD-10-CM | POA: Diagnosis not present

## 2014-04-26 DIAGNOSIS — I129 Hypertensive chronic kidney disease with stage 1 through stage 4 chronic kidney disease, or unspecified chronic kidney disease: Secondary | ICD-10-CM | POA: Diagnosis not present

## 2014-04-26 DIAGNOSIS — N189 Chronic kidney disease, unspecified: Secondary | ICD-10-CM | POA: Diagnosis not present

## 2014-04-26 DIAGNOSIS — N2581 Secondary hyperparathyroidism of renal origin: Secondary | ICD-10-CM | POA: Diagnosis not present

## 2014-05-06 ENCOUNTER — Encounter: Payer: Self-pay | Admitting: Vascular Surgery

## 2014-05-06 ENCOUNTER — Ambulatory Visit (INDEPENDENT_AMBULATORY_CARE_PROVIDER_SITE_OTHER): Payer: Self-pay | Admitting: Vascular Surgery

## 2014-05-06 VITALS — BP 132/82 | HR 78 | Temp 97.9°F | Resp 16 | Ht 71.0 in | Wt 152.5 lb

## 2014-05-06 DIAGNOSIS — N186 End stage renal disease: Secondary | ICD-10-CM | POA: Insufficient documentation

## 2014-05-06 DIAGNOSIS — Z992 Dependence on renal dialysis: Secondary | ICD-10-CM | POA: Insufficient documentation

## 2014-05-06 DIAGNOSIS — M79601 Pain in right arm: Secondary | ICD-10-CM | POA: Insufficient documentation

## 2014-05-06 NOTE — Progress Notes (Signed)
Patient is a 58 year old male who is status post right basilic vein transposition fistula on October 26. He has had increased swelling of the right upper extremity since then. He denies any prior central lines or instrumentation of his central veins previously. He states the swelling began on postoperative day #1. He states the swelling gets worse if he uses his arm a lot. He has some numbness and tingling in his fingertips but is more bothered by the swelling. The arm is primarily swollen from the elbow down into the hand. He is not currently on dialysis.  Physical exam:  Filed Vitals:   05/06/14 1424  BP: 132/82  Pulse: 78  Temp: 97.9 F (36.6 C)  TempSrc: Oral  Resp: 16  Height: 5\' 11"  (1.803 m)  Weight: 152 lb 8 oz (69.174 kg)  SpO2: 99%    Healing incisions right upper extremity audible bruit and palpable thrill in fistula Edema extending from the elbow down into the hand especially the dorsum of the hand 2+ right radial pulse no ulcerations on the fingertips  Chest: No obvious chest wall collaterals  Assessment: Right upper extremity swelling potentially due to central vein stenosis but likely had of this should be low with no prior instrumentation. This may just be due to disruption of several venous branch collaterals from harvesting the basilic vein.  Plan: Patient was given an Ace wrap to wrap from the left hand up to the level of the elbow. He will keep this on as much as possible. He has follow-up scheduled with me in a few weeks. If he still has persistence of the swelling that point we will need to consider a fistulogram and central venogram. However I would like to avoid this if possible since he is not currently on dialysis. Unfortunately this may be the only option that we have rather than ligating his fistula.  Ruta Hinds, MD [facility] Office: [phone] Pager: [phone]

## 2014-05-17 ENCOUNTER — Other Ambulatory Visit (HOSPITAL_COMMUNITY): Payer: Self-pay | Admitting: Anesthesiology

## 2014-05-17 ENCOUNTER — Other Ambulatory Visit (HOSPITAL_COMMUNITY): Payer: Self-pay | Admitting: Adult Health

## 2014-05-17 DIAGNOSIS — I5022 Chronic systolic (congestive) heart failure: Secondary | ICD-10-CM

## 2014-05-18 ENCOUNTER — Other Ambulatory Visit: Payer: Self-pay

## 2014-05-18 MED ORDER — FUROSEMIDE 40 MG PO TABS: 40.0000 mg | ORAL_TABLET | Freq: Every day | ORAL | Status: DC

## 2014-05-19 ENCOUNTER — Ambulatory Visit (HOSPITAL_COMMUNITY)
Admission: RE | Admit: 2014-05-19 | Discharge: 2014-05-19 | Disposition: A | Discharge status: Home / Self Care | Payer: Medicare Other | Source: Ambulatory Visit | Attending: Vascular Surgery | Admitting: Vascular Surgery

## 2014-05-19 DIAGNOSIS — Z4931 Encounter for adequacy testing for hemodialysis: Secondary | ICD-10-CM | POA: Diagnosis not present

## 2014-05-19 DIAGNOSIS — N186 End stage renal disease: Secondary | ICD-10-CM

## 2014-05-24 ENCOUNTER — Telehealth: Payer: Self-pay | Admitting: Internal Medicine

## 2014-05-24 DIAGNOSIS — D571 Sickle-cell disease without crisis: Secondary | ICD-10-CM

## 2014-05-24 NOTE — Telephone Encounter (Signed)
Refill request for oxycodone 5mg . LOV 03/26/2014

## 2014-05-26 ENCOUNTER — Encounter (HOSPITAL_COMMUNITY): Payer: Self-pay | Admitting: Cardiology

## 2014-05-26 ENCOUNTER — Encounter: Payer: Self-pay | Admitting: Vascular Surgery

## 2014-05-26 ENCOUNTER — Other Ambulatory Visit (HOSPITAL_COMMUNITY): Payer: Self-pay | Admitting: Adult Health

## 2014-05-26 ENCOUNTER — Ambulatory Visit (INDEPENDENT_AMBULATORY_CARE_PROVIDER_SITE_OTHER): Payer: Medicare Other | Admitting: Family Medicine

## 2014-05-26 VITALS — BP 123/66 | HR 79 | Temp 98.0°F | Resp 16 | Ht 71.0 in | Wt 149.0 lb

## 2014-05-26 DIAGNOSIS — M25561 Pain in right knee: Secondary | ICD-10-CM

## 2014-05-26 DIAGNOSIS — N184 Chronic kidney disease, stage 4 (severe): Secondary | ICD-10-CM | POA: Diagnosis not present

## 2014-05-26 DIAGNOSIS — I158 Other secondary hypertension: Secondary | ICD-10-CM

## 2014-05-26 DIAGNOSIS — Z720 Tobacco use: Secondary | ICD-10-CM

## 2014-05-26 DIAGNOSIS — M1000 Idiopathic gout, unspecified site: Secondary | ICD-10-CM | POA: Diagnosis not present

## 2014-05-26 DIAGNOSIS — D571 Sickle-cell disease without crisis: Secondary | ICD-10-CM | POA: Diagnosis not present

## 2014-05-26 DIAGNOSIS — M10069 Idiopathic gout, unspecified knee: Secondary | ICD-10-CM

## 2014-05-26 DIAGNOSIS — F17210 Nicotine dependence, cigarettes, uncomplicated: Secondary | ICD-10-CM | POA: Diagnosis not present

## 2014-05-26 DIAGNOSIS — M25562 Pain in left knee: Secondary | ICD-10-CM | POA: Diagnosis not present

## 2014-05-26 DIAGNOSIS — M109 Gout, unspecified: Secondary | ICD-10-CM

## 2014-05-26 LAB — CBC WITH DIFFERENTIAL/PLATELET
Basophils Absolute: 0.1 10*3/uL (ref 0.0–0.1)
Basophils Relative: 1 % (ref 0–1)
Eosinophils Absolute: 0.3 10*3/uL (ref 0.0–0.7)
Eosinophils Relative: 4 % (ref 0–5)
HCT: 23.6 % — ABNORMAL LOW (ref 39.0–52.0)
Hemoglobin: 8.2 g/dL — ABNORMAL LOW (ref 13.0–17.0)
Lymphocytes Relative: 20 % (ref 12–46)
Lymphs Abs: 1.7 10*3/uL (ref 0.7–4.0)
MCH: 36.9 pg — ABNORMAL HIGH (ref 26.0–34.0)
MCHC: 34.7 g/dL (ref 30.0–36.0)
MCV: 106.3 fL — ABNORMAL HIGH (ref 78.0–100.0)
MPV: 11.7 fL (ref 9.4–12.4)
Monocytes Absolute: 1.4 10*3/uL — ABNORMAL HIGH (ref 0.1–1.0)
Monocytes Relative: 17 % — ABNORMAL HIGH (ref 3–12)
Neutro Abs: 4.9 10*3/uL (ref 1.7–7.7)
Neutrophils Relative %: 58 % (ref 43–77)
Platelets: 252 10*3/uL (ref 150–400)
RBC: 2.22 MIL/uL — ABNORMAL LOW (ref 4.22–5.81)
RDW: 18.2 % — ABNORMAL HIGH (ref 11.5–15.5)
WBC: 8.4 10*3/uL (ref 4.0–10.5)

## 2014-05-26 LAB — COMPLETE METABOLIC PANEL WITH GFR
ALT: 15 U/L (ref 0–53)
AST: 26 U/L (ref 0–37)
Albumin: 4.1 g/dL (ref 3.5–5.2)
Alkaline Phosphatase: 77 U/L (ref 39–117)
BUN: 51 mg/dL — ABNORMAL HIGH (ref 6–23)
CO2: 21 mEq/L (ref 19–32)
Calcium: 8.4 mg/dL (ref 8.4–10.5)
Chloride: 103 mEq/L (ref 96–112)
Creat: 2.77 mg/dL — ABNORMAL HIGH (ref 0.50–1.35)
GFR, Est African American: 28 mL/min — ABNORMAL LOW
GFR, Est Non African American: 24 mL/min — ABNORMAL LOW
Glucose, Bld: 89 mg/dL (ref 70–99)
Potassium: 4.5 mEq/L (ref 3.5–5.3)
Sodium: 134 mEq/L — ABNORMAL LOW (ref 135–145)
Total Bilirubin: 1.9 mg/dL — ABNORMAL HIGH (ref 0.2–1.2)
Total Protein: 7.3 g/dL (ref 6.0–8.3)

## 2014-05-26 LAB — URIC ACID: Uric Acid, Serum: 8.4 mg/dL — ABNORMAL HIGH (ref 4.0–7.8)

## 2014-05-26 MED ORDER — ALLOPURINOL 100 MG PO TABS: 100.0000 mg | ORAL_TABLET | Freq: Every day | ORAL | Status: DC

## 2014-05-26 MED ORDER — OXYCODONE HCL 5 MG PO TABS: 5.0000 mg | ORAL_TABLET | ORAL | Status: DC | PRN

## 2014-05-26 NOTE — Patient Instructions (Addendum)
Gout Gout is an inflammatory arthritis caused by a buildup of uric acid crystals in the joints. Uric acid is a chemical that is normally present in the blood. When the level of uric acid in the blood is too high it can form crystals that deposit in your joints and tissues. This causes joint redness, soreness, and swelling (inflammation). Repeat attacks are common. Over time, uric acid crystals can form into masses (tophi) near a joint, destroying bone and causing disfigurement. Gout is treatable and often preventable. CAUSES  The disease begins with elevated levels of uric acid in the blood. Uric acid is produced by your body when it breaks down a naturally found substance called purines. Certain foods you eat, such as meats and fish, contain high amounts of purines. Causes of an elevated uric acid level include:  Being passed down from parent to child (heredity).  Diseases that cause increased uric acid production (such as obesity, psoriasis, and certain cancers).  Excessive alcohol use.  Diet, especially diets rich in meat and seafood.  Medicines, including certain cancer-fighting medicines (chemotherapy), water pills (diuretics), and aspirin.  Chronic kidney disease. The kidneys are no longer able to remove uric acid well.  Problems with metabolism. Conditions strongly associated with gout include:  Obesity.  High blood pressure.  High cholesterol.  Diabetes. Not everyone with elevated uric acid levels gets gout. It is not understood why some people get gout and others do not. Surgery, joint injury, and eating too much of certain foods are some of the factors that can lead to gout attacks. SYMPTOMS   An attack of gout comes on quickly. It causes intense pain with redness, swelling, and warmth in a joint.  Fever can occur.  Often, only one joint is involved. Certain joints are more commonly involved:  Base of the big toe.  Knee.  Ankle.  Wrist.  Finger. Without  treatment, an attack usually goes away in a few days to weeks. Between attacks, you usually will not have symptoms, which is different from many other forms of arthritis. DIAGNOSIS  Your caregiver will suspect gout based on your symptoms and exam. In some cases, tests may be recommended. The tests may include:  Blood tests.  Urine tests.  X-rays.  Joint fluid exam. This exam requires a needle to remove fluid from the joint (arthrocentesis). Using a microscope, gout is confirmed when uric acid crystals are seen in the joint fluid. TREATMENT  There are two phases to gout treatment: treating the sudden onset (acute) attack and preventing attacks (prophylaxis).  Treatment of an Acute Attack.  Medicines are used. These include anti-inflammatory medicines or steroid medicines.  An injection of steroid medicine into the affected joint is sometimes necessary.  The painful joint is rested. Movement can worsen the arthritis.  You may use warm or cold treatments on painful joints, depending which works best for you.  Treatment to Prevent Attacks.  If you suffer from frequent gout attacks, your caregiver may advise preventive medicine. These medicines are started after the acute attack subsides. These medicines either help your kidneys eliminate uric acid from your body or decrease your uric acid production. You may need to stay on these medicines for a very long time.  The early phase of treatment with preventive medicine can be associated with an increase in acute gout attacks. For this reason, during the first few months of treatment, your caregiver may also advise you to take medicines usually used for acute gout treatment. Be sure you   understand your caregiver's directions. Your caregiver may make several adjustments to your medicine dose before these medicines are effective.  Discuss dietary treatment with your caregiver or dietitian. Alcohol and drinks high in sugar and fructose and foods  such as meat, poultry, and seafood can increase uric acid levels. Your caregiver or dietitian can advise you on drinks and foods that should be limited. HOME CARE INSTRUCTIONS   Do not take aspirin to relieve pain. This raises uric acid levels.  Only take over-the-counter or prescription medicines for pain, discomfort, or fever as directed by your caregiver.  Rest the joint as much as possible. When in bed, keep sheets and blankets off painful areas.  Keep the affected joint raised (elevated).  Apply warm or cold treatments to painful joints. Use of warm or cold treatments depends on which works best for you.  Use crutches if the painful joint is in your leg.  Drink enough fluids to keep your urine clear or pale yellow. This helps your body get rid of uric acid. Limit alcohol, sugary drinks, and fructose drinks.  Follow your dietary instructions. Pay careful attention to the amount of protein you eat. Your daily diet should emphasize fruits, vegetables, whole grains, and fat-free or low-fat milk products. Discuss the use of coffee, vitamin C, and cherries with your caregiver or dietitian. These may be helpful in lowering uric acid levels.  Maintain a healthy body weight. SEEK MEDICAL CARE IF:   You develop diarrhea, vomiting, or any side effects from medicines.  You do not feel better in 24 hours, or you are getting worse. SEEK IMMEDIATE MEDICAL CARE IF:   Your joint becomes suddenly more tender, and you have chills or a fever. MAKE SURE YOU:   Understand these instructions.  Will watch your condition.  Will get help right away if you are not doing well or get worse. Document Released: 06/01/2000 Document Revised: 10/19/2013 Document Reviewed: 01/16/2012 Exit[employer] Patient Information 2015 [employer]. This information is not intended to replace advice given to you by your health care provider. Make sure you discuss any questions you have with your health care  provider. Low-Purine Diet Purines are compounds that affect the level of uric acid in your body. A low-purine diet is a diet that is low in purines. Eating a low-purine diet can prevent the level of uric acid in your body from getting too high and causing gout or kidney stones or both. WHAT DO I NEED TO KNOW ABOUT THIS DIET?  Choose low-purine foods. Examples of low-purine foods are listed in the next section.  Drink plenty of fluids, especially water. Fluids can help remove uric acid from your body. Try to drink 8-16 cups (1.9-3.8 L) a day.  Limit foods high in fat, especially saturated fat, as fat makes it harder for the body to get rid of uric acid. Foods high in saturated fat include pizza, cheese, ice cream, whole milk, fried foods, and gravies. Choose foods that are lower in fat and lean sources of protein. Use olive oil when cooking as it contains healthy fats that are not high in saturated fat.  Limit alcohol. Alcohol interferes with the elimination of uric acid from your body. If you are having a gout attack, avoid all alcohol.  Keep in mind that different people's bodies react differently to different foods. You will probably learn over time which foods do or do not affect you. If you discover that a food tends to cause your gout to flare up,   avoid eating that food. You can more freely enjoy foods that do not cause problems. If you have any questions about a food item, talk to your dietitian or health care provider. WHICH FOODS ARE LOW, MODERATE, AND HIGH IN PURINES? The following is a list of foods that are low, moderate, and high in purines. You can eat any amount of the foods that are low in purines. You may be able to have small amounts of foods that are moderate in purines. Ask your health care provider how much of a food moderate in purines you can have. Avoid foods high in purines. Grains  Foods low in purines: Enriched white bread, pasta, rice, cake, cornbread, popcorn.  Foods  moderate in purines: Whole-grain breads and cereals, wheat germ, bran, oatmeal. Uncooked oatmeal. Dry wheat bran or wheat germ.  Foods high in purines: Pancakes, French toast, biscuits, muffins. Vegetables  Foods low in purines: All vegetables, except those that are moderate in purines.  Foods moderate in purines: Asparagus, cauliflower, spinach, mushrooms, green peas. Fruits  All fruits are low in purines. Meats and other Protein Foods  Foods low in purines: Eggs, nuts, peanut butter.  Foods moderate in purines: 80-90% lean beef, lamb, veal, pork, poultry, fish, eggs, peanut butter, nuts. Crab, lobster, oysters, and shrimp. Cooked dried beans, peas, and lentils.  Foods high in purines: Anchovies, sardines, herring, mussels, tuna, codfish, scallops, trout, and haddock. Bacon. Organ meats (such as liver or kidney). Tripe. Game meat. Goose. Sweetbreads. Dairy  All dairy foods are low in purines. Low-fat and fat-free dairy products are best because they are low in saturated fat. Beverages  Drinks low in purines: Water, carbonated beverages, tea, coffee, cocoa.  Drinks moderate in purines: Soft drinks and other drinks sweetened with high-fructose corn syrup. Juices. To find whether a food or drink is sweetened with high-fructose corn syrup, look at the ingredients list.  Drinks high in purines: Alcoholic beverages (such as beer). Condiments  Foods low in purines: Salt, herbs, olives, pickles, relishes, vinegar.  Foods moderate in purines: Butter, margarine, oils, mayonnaise. Fats and Oils  Foods low in purines: All types, except gravies and sauces made with meat.  Foods high in purines: Gravies and sauces made with meat. Other Foods  Foods low in purines: Sugars, sweets, gelatin. Cake. Soups made without meat.  Foods moderate in purines: Meat-based or fish-based soups, broths, or bouillons. Foods and drinks sweetened with high-fructose corn syrup.  Foods high in purines:  High-fat desserts (such as ice cream, cookies, cakes, pies, doughnuts, and chocolate). Contact your dietitian for more information on foods that are not listed here. Document Released: 09/29/2010 Document Revised: 06/09/2013 Document Reviewed: 05/11/2013 ExitCare Patient Information 2015 [employer]. This information is not intended to replace advice given to you by your health care provider. Make sure you discuss any questions you have with your health care provider.  

## 2014-05-26 NOTE — Progress Notes (Signed)
Subjective:    Patient ID: Patrick [name], male    DOB: [birth year], 58 y.o.   MRN: [id number]  HPI Patrick [name], 58 year old male with a history of sickle cell anemia, HbSS, HTN, CKD and gout presents with increasing bilateral knee pain for 3 days. He states that he was doing well up until  3 days ago when he started having knee pain.  He reports that he has been out of allopurinol for 4 days and is also out of pain medications.  The patient reports that his last gout attack was in April 2015. Attacks occur primarily in the left knee and left great toe. Patrick [name] states that his current pain intensity is 4-5/10 described as intermittent and throbbing. He reports that he last had Tylenol 1 day ago with minimal relief. He reports mild swelling to bilateral knees that has improved over there past 24 hours. He states that he has increased water intake and has been increasing rest and elevating extremities. The patient is not avoiding high purine foods and he does not drink alcohol. He denies headache, chest pains, shortness of breath, weakness, or  numbness and tingling to extremities.    Past Medical History  Diagnosis Date  . Swelling of extremity, right   . Swelling abdomen   . Swelling of extremity, left   . Blood transfusion     "I've had a bunch of them"  . Blood dyscrasia   . Legally blind   . Sickle cell disease, type SS   . [id number])     "probably weekly" (01/13/2014)  . Gout   . Chronic kidney disease (CKD), stage III (moderate)   . CHF (congestive heart failure)     followed by [facility]   Review of Systems  Constitutional: Negative.  Negative for fever and fatigue.  HENT: Negative.   Eyes: Negative.   Respiratory: Negative.   Cardiovascular: Negative.  Negative for chest pain and palpitations.  Gastrointestinal: Negative.   Endocrine: Negative.   Genitourinary: Negative for dysuria, urgency and decreased urine volume.  Musculoskeletal: Positive for myalgias  (bilateral knees) and arthralgias (knee pain). Negative for back pain.  Skin: Negative.   Allergic/Immunologic: Negative.   Neurological: Positive for dizziness.  Hematological: Negative.   Psychiatric/Behavioral: Negative.        Objective:   Physical Exam  Constitutional: He is oriented to person, place, and time. He appears well-developed and well-nourished.  HENT:  Head: Normocephalic and atraumatic.  Right Ear: External ear normal.  Left Ear: External ear normal.  Mouth/Throat: Oropharynx is clear and moist.  Eyes: Conjunctivae are normal. Pupils are equal, round, and reactive to light.  Neck: Normal range of motion. Neck supple.  Pulmonary/Chest: Effort normal and breath sounds normal.  Abdominal: Soft. Bowel sounds are normal.  Musculoskeletal: Normal range of motion.       Right wrist: He exhibits swelling (along right arm surgical scar). He exhibits normal range of motion and no tenderness.       Right knee: He exhibits swelling (trace edema, warm to palpation). He exhibits normal range of motion, no ecchymosis, no deformity and no erythema. Tenderness (mildly tender to palpation) found.       Left knee: He exhibits swelling (Trace edema, warm to palpation). He exhibits normal range of motion, no ecchymosis, no erythema and normal patellar mobility. Tenderness (mildly tender to palpation) found.  Neurological: He is alert and oriented to person, place, and time.  Skin: Skin is warm and  dry.  Psychiatric: He has a normal mood and affect. His behavior is normal. Judgment and thought content normal.         BP 123/66 mmHg  Pulse 79  Temp(Src) 98 F (36.7 C) (Oral)  Resp 16  Ht 5\' 11"  (1.803 m)  Wt 149 lb (67.586 kg)  BMI 20.79 kg/m2 Assessment & Plan:   Hb-SS disease without crisis  Discussed the need to continue Hydrea as prescribed. We also discussed the need for good hydration, monitoring of hydration status, avoidance of heat, cold, stress, and infection triggers.  We discussed the risks and benefits of Hydrea, including bone marrow suppression, the possibility of GI upset, skin ulcers, hair thinning, and teratogenicity. The patient was reminded of the need to seek medical attention of any symptoms of bleeding, anemia, or infection. Also, continue folic acid 1 mg daily to prevent aplastic bone marrow crises.    Eye - High risk of proliferative retinopathy. Annual eye exam with retinal exam recommended to patient. His last eye evaluation was in August 2015 with Patrick [name]. He states that left eye is improving per Patrick [name]. He has been declared legally blind and [facility]   Immunization status - He is up to date on vaccinations  Acute and chronic painful episodes - Patient takes opiate medications occasionally for sickle cell related pain. He states that he is currently out of medications. Patrick [name] is aware that h is to receive his Schedule II prescriptions only from [place]. He is also aware that her prescription history is available to [place] online through the [facility]. He was reminded that all patients receiving Schedule II narcotics must be seen for follow up every three months. Reviewed [facility]stance Reporting system prior to reorder   - oxyCODONE (ROXICODONE) 5 MG immediate release tablet; Take 1 tablet (5 mg total) by mouth every 4 (four) hours as needed for moderate pain or severe pain.  Dispense: 60 tablet; Refill: 0 - CBC with Differential  Gout of knee Patrick [name] has not experienced a gout flare since April 2015. He currently has trace edema to bilateral knees, which is tender to touch. He reports that gout pain primarily occurs in left great toe. He has been out of Allopurinol for the past 4 days. Last uric acid level was elevated at 12.0 eight months ago. I will re-check uric acid level.   - allopurinol (ZYLOPRIM) 100 MG tablet; Take 1 tablet (100 mg total) by mouth daily.  Dispense: 30 tablet; Refill: 2 - Uric Acid   Bilateral knee pain Elevate extremities  to heart level while at rest. Also, recommended heating pad to bilateral knees as needed.   - oxyCODONE (ROXICODONE) 5 MG immediate release tablet; Take 1 tablet (5 mg total) by mouth every 4 (four) hours as needed for moderate pain or severe pain.  Dispense: 60 tablet; Refill: 0  Hypertension Stable on current medication regimen. He is scheduled to follow up with cardiologist.  - COMPLETE METABOLIC PANEL WITH GFR   Chronic Kidney Disease He is followed by Dr. Lorrene Reid for CKD and had been receiving Arenesp for renally mediated anemia. Patient states that his last Arenesp treatment was in September 2015. Patient is not aware whether treatments will continue. I will contact Dr. Lorrene Reid for clarification.   Tobacco abuse Mr. Dimichele continues to smoke 4-5 cigarettes per day. We discussed the imports of smoking cessation, he expresses understanding, but is not ready to quit at this point.  Dorena Dew, FNP

## 2014-05-27 ENCOUNTER — Encounter: Payer: Self-pay | Admitting: Vascular Surgery

## 2014-05-27 ENCOUNTER — Ambulatory Visit (INDEPENDENT_AMBULATORY_CARE_PROVIDER_SITE_OTHER): Payer: Medicare Other | Admitting: Vascular Surgery

## 2014-05-27 VITALS — BP 124/71 | HR 74 | Temp 98.1°F | Resp 16 | Ht 71.0 in | Wt 148.0 lb

## 2014-05-27 DIAGNOSIS — M6289 Other specified disorders of muscle: Secondary | ICD-10-CM | POA: Insufficient documentation

## 2014-05-27 DIAGNOSIS — R200 Anesthesia of skin: Secondary | ICD-10-CM

## 2014-05-27 DIAGNOSIS — G729 Myopathy, unspecified: Secondary | ICD-10-CM

## 2014-05-27 HISTORY — DX: Other specified disorders of muscle: M62.89

## 2014-05-27 NOTE — Progress Notes (Signed)
Patient is a 58 year old male who is status post right basilic vein transposition fistula on October 26. He has had increased swelling of the right upper extremity since then. He states this has improved significantly since his last office visit. He denies any numbness or tingling in his hand. He is not currently on dialysis.  Physical exam:    Filed Vitals:   05/27/14 1532  BP: 124/71  Pulse: 74  Temp: 98.1 F (36.7 C)  TempSrc: Oral  Resp: 16  Height: 5\' 11"  (1.803 m)  Weight: 148 lb (67.132 kg)  SpO2: 99%    Healing incisions right upper extremity audible bruit and palpable thrill in fistula Edema extending from the wrist and slightly in the dorsum of the hand 2+ right radial pulse no ulcerations on the fingertips  Chest: No obvious chest wall collaterals  Data: Recent duplex ultrasound of the patient fistula shows 8-10 mm diameter less than 6 mm from the skin no narrowing  Assessment: Right upper extremity swelling significantly improved. This may just be due to disruption of several venous branch collaterals from harvesting the basilic vein.  Plan: Maturing fistula improving edema follow-up as needed  Ruta Hinds, MD [facility] Office: [phone] Pager: [phone]

## 2014-05-30 ENCOUNTER — Encounter: Payer: Self-pay | Admitting: Family Medicine

## 2014-06-07 ENCOUNTER — Ambulatory Visit (HOSPITAL_COMMUNITY)
Admission: RE | Admit: 2014-06-07 | Discharge: 2014-06-07 | Disposition: A | Discharge status: Home / Self Care | Payer: Medicare Other | Source: Ambulatory Visit | Attending: Internal Medicine | Admitting: Internal Medicine

## 2014-06-07 ENCOUNTER — Encounter (HOSPITAL_COMMUNITY): Payer: Self-pay

## 2014-06-07 VITALS — BP 123/63 | HR 67 | Resp 18 | Wt 154.0 lb

## 2014-06-07 DIAGNOSIS — I10 Essential (primary) hypertension: Secondary | ICD-10-CM

## 2014-06-07 DIAGNOSIS — N184 Chronic kidney disease, stage 4 (severe): Secondary | ICD-10-CM | POA: Diagnosis not present

## 2014-06-07 DIAGNOSIS — D571 Sickle-cell disease without crisis: Secondary | ICD-10-CM | POA: Insufficient documentation

## 2014-06-07 DIAGNOSIS — I5022 Chronic systolic (congestive) heart failure: Secondary | ICD-10-CM

## 2014-06-07 DIAGNOSIS — Z720 Tobacco use: Secondary | ICD-10-CM | POA: Diagnosis not present

## 2014-06-07 MED ORDER — FUROSEMIDE 40 MG PO TABS: 60.0000 mg | ORAL_TABLET | Freq: Every day | ORAL | Status: DC

## 2014-06-07 NOTE — Patient Instructions (Signed)
Doing great.  Have a wonderful Christmas and a Happy New Year.  Take an extra 40 mg lasix today and then increase lasix to 60 mg (1 1/2 tablets) daily.  Follow up in 2 weeks with labs.  Follow up in 2 months  Call any issues.   Do the following things EVERYDAY: 1) Weigh yourself in the morning before breakfast. Write it down and keep it in a log. 2) Take your medicines as prescribed 3) Eat low salt foods-Limit salt (sodium) to 2000 mg per day.  4) Stay as active as you can everyday 5) Limit all fluids for the day to less than 2 liters 6)

## 2014-06-07 NOTE — Progress Notes (Signed)
Patient ID: Patrick [name], male   DOB: Jan 11, 1956, 58 y.o.   MRN: [id number]  PCP: Dr Anselm Pancoast Cell : Dr Zigmund Daniel  Opthalmologist: [name]  [employer]   HPI: Patrick [name] is a 58 y.o. African American male with a history of sickle cell anemia, systolic HF due to NICM EF 40% 11/2012 initially diagnosed 05/2011, no CAD by cath 05/2011, CKD stage III, legally blind, and tobacco abuse.   Admitted 11/18/12 from HF clinic with voume overload. Hemoglobin 6.7 Transfused 2 UPRBC. Diuresed with IV lasix. Lisinopril stopped due to CKD. Discharge weight 143 pounds.    Admitted 02/07/13 through 02/12/13 after being treated for N/V/D, and abdominal pain. CT of abdomen- cholelithias with plans to f/u for elective cholecystectomy. Received 2UPRBC.  Hemoglobin 6.1>7.1.  Diuretics held due to dehydration. He restarted lasix 60 mg bid 02/13/13.   Admitted to [facility] 3/4 through 08/22/13 with anemia and volume overload. Hemglobin was 5.9. Received 1U PRBC with hemoglobin increased to 6.4 Diuresed with IV lasix. Discharged on carvedilol 25 mg twice a day, lasix 80 mg twice a day, hydralazine 25 mg tid, and imdur 30 mg daily.  D/C weight was 140 pounds.  He is not on Spironolactone due to hyperkalemia. He is not on ace inhibitor due to renal failure.  Follow up for Heart Failure: Doing well. He saw Dr. Oneida Alar and fistula maturing well. Denies SOB, PND, orthopnea or CP. Nothing stops him. Able to go around the whole grocery store with no issues and go upstairs. Weight at home 148-150 lbs. Taking medications as prescribed. Smoking 5 cigarettes a day. Following a low salt diet and drinking a little more than 2L a day.   01/17/12 ECHO 55%-60%  11/20/12 ECHO EF 40% Peak PA pressure 56 11/30/13 ECHO 40-45%   Labs        02/17/13: K+ 5.0, BUN 45, Creatinine 2.2, proBNP 10135       05/21/13 K 5.4 Creatinine 2.25 Pro BNP 3076        06/09/13 K 4.6 Creatinine 2.1 Pro BNP 10,727        07/07/13 Creatinine 2.06 K 5.0  08/19/13 Creatinine 2.5 Hemoglobin 5.9 Pro BNP 8266         08/27/13 K 4.5 Creatinine 2.78 Hemoglobin 7.2        09/21/13 K 4.2, creatinine 2.56         01/14/14 K 4.7, creatinine 2.29        03/08/14 K 4.8 Creatinine 2.17   SH: Lives with his cousin in [place]. Smokes 8 cigarettes per day. Occasional ETOH and no drugs  FH: Mother deceased: liver failure, no CAD        Father deceased: CAD, MI  ROS: All systems negative except as listed in HPI, PMH and Problem List.  Past Medical History  Diagnosis Date  . Swelling of extremity, right   . Swelling abdomen   . Swelling of extremity, left   . Blood transfusion     "I've had a bunch of them"  . Blood dyscrasia   . Legally blind   . Sickle cell disease, type SS   . [id number])     "probably weekly" (01/13/2014)  . Gout   . Chronic kidney disease (CKD), stage III (moderate)   . CHF (congestive heart failure)     followed by hf clinic    Current Outpatient Prescriptions  Medication Sig Dispense Refill  . allopurinol (ZYLOPRIM) 100 MG tablet Take 1 tablet (100 mg  total) by mouth daily. 30 tablet 2  . aspirin EC 81 MG tablet Take 81 mg by mouth daily.    . calcitRIOL (ROCALTROL) 0.25 MCG capsule Take 0.25 mcg by mouth 3 (three) times a week. Monday, Wednesday and Friday    . carvedilol (COREG) 25 MG tablet TAKE 1 TABLET (25 MG TOTAL) BY MOUTH 2 (TWO) TIMES DAILY WITH A MEAL. 60 tablet 1  . folic acid (FOLVITE) 1 MG tablet Take 1 mg by mouth daily.    . furosemide (LASIX) 40 MG tablet Take 1 tablet (40 mg total) by mouth daily. 30 tablet 1  . hydrALAZINE (APRESOLINE) 50 MG tablet Take 75 mg by mouth 3 (three) times daily.    . hydroxyurea (HYDREA) 500 MG capsule Take 1 capsule (500 mg total) by mouth daily. May take with food to minimize GI side effects. 30 capsule 3  . isosorbide mononitrate (IMDUR) 60 MG 24 hr tablet Take 1 tablet (60 mg total) by mouth daily. 30 tablet 3  . metolazone (ZAROXOLYN) 2.5 MG tablet TAKE 1 TABLET (2.5 MG  TOTAL) BY MOUTH AS NEEDED. 8 tablet 6  . oxyCODONE (ROXICODONE) 5 MG immediate release tablet Take 1 tablet (5 mg total) by mouth every 4 (four) hours as needed for moderate pain or severe pain. 60 tablet 0   No current facility-administered medications for this encounter.    Filed Vitals:   06/07/14 1044  BP: 123/63  Pulse: 67  Resp: 18  Weight: 154 lb (69.854 kg)  SpO2: 97%    PHYSICAL EXAM: General:  Chronically ill appearing.  No resp difficulty  HEENT: normal except for mild scleral icterus Neck: supple. JVP 8 with +HJR, Carotids 2+ bilaterally; no bruits. No lymphadenopathy or thryomegaly appreciated. Cor: PMI normal Regular rate & rhythm. No rubs, gallops 2/6 TR and 2/6 MR Lungs: clear    Abdomen: soft, nontender, non-distended. No hepatosplenomegaly. No bruits or masses. Good bowel sounds. Extremities: no cyanosis, rash,  edema ,  + clubbing., RUE +thrill and bruit Neuro: alert & orientedx3, cranial nerves grossly intact. Moves all 4 extremities w/o difficulty. Affect pleasant.  ASSESSMENT & PLAN:  1. Chronic Systolic Heart Failure:  NICM. Cath 05/2011 without significant coronary disease.  ECHO 11/2013 EF 40-45%. - NYHA II symptoms and volume status elevated. Will have him take an extra 40 mg lasix today and then increase lasix to 60 mg daily. Check BMET in 2 weeks.  -  On goal coreg 25 mg twice a day.   - Continue hydralazine to 75 mg tid and Imdur 60 mg daily. - He is not on Ace/Arb/spiro due to hyperkalemia/CKD - Reinforced the need and importance of daily weights, a low sodium diet, and fluid restriction (less than 2 L a day). Instructed to call the HF clinic if weight increases more than 3 lbs overnight or 5 lbs in a week.  2. HTN: stable, continue current medications.  3  Sickle Cell: Follows closely in sickle cell clinic.  4. Current smoker:  Declines smoking cessation. Encouraged to stop.  5. CKD stabe IV: Continue to follow renal function closely. Followed by  nephrology, Dr Lorrene Reid. Has RUE fistula now that is maturing.    F/U 2 months  Rande Brunt NP-C  06/07/2014

## 2014-06-22 ENCOUNTER — Ambulatory Visit (HOSPITAL_COMMUNITY)
Admission: RE | Admit: 2014-06-22 | Discharge: 2014-06-22 | Disposition: A | Discharge status: Home / Self Care | Payer: PPO | Source: Ambulatory Visit | Attending: Cardiology | Admitting: Cardiology

## 2014-06-22 DIAGNOSIS — I5022 Chronic systolic (congestive) heart failure: Secondary | ICD-10-CM | POA: Insufficient documentation

## 2014-06-22 DIAGNOSIS — N2581 Secondary hyperparathyroidism of renal origin: Secondary | ICD-10-CM | POA: Diagnosis not present

## 2014-06-22 DIAGNOSIS — D649 Anemia, unspecified: Secondary | ICD-10-CM | POA: Diagnosis not present

## 2014-06-22 DIAGNOSIS — N184 Chronic kidney disease, stage 4 (severe): Secondary | ICD-10-CM | POA: Diagnosis not present

## 2014-06-22 DIAGNOSIS — I129 Hypertensive chronic kidney disease with stage 1 through stage 4 chronic kidney disease, or unspecified chronic kidney disease: Secondary | ICD-10-CM | POA: Diagnosis not present

## 2014-06-22 DIAGNOSIS — N189 Chronic kidney disease, unspecified: Secondary | ICD-10-CM | POA: Diagnosis not present

## 2014-06-22 LAB — BASIC METABOLIC PANEL
Anion gap: 9 (ref 5–15)
BUN: 54 mg/dL — ABNORMAL HIGH (ref 6–23)
CO2: 21 mmol/L (ref 19–32)
Calcium: 8.7 mg/dL (ref 8.4–10.5)
Chloride: 107 mEq/L (ref 96–112)
Creatinine, Ser: 2.44 mg/dL — ABNORMAL HIGH (ref 0.50–1.35)
GFR calc Af Amer: 32 mL/min — ABNORMAL LOW (ref >90)
GFR calc non Af Amer: 28 mL/min — ABNORMAL LOW (ref >90)
Glucose, Bld: 109 mg/dL — ABNORMAL HIGH (ref 70–99)
Potassium: 4.4 mmol/L (ref 3.5–5.1)
Sodium: 137 mmol/L (ref 135–145)

## 2014-06-22 NOTE — Progress Notes (Signed)
Pt in for labs, bmet drawn

## 2014-06-26 ENCOUNTER — Other Ambulatory Visit: Payer: Self-pay | Admitting: Internal Medicine

## 2014-08-11 ENCOUNTER — Other Ambulatory Visit (HOSPITAL_COMMUNITY): Payer: Self-pay | Admitting: Cardiology

## 2014-08-11 DIAGNOSIS — I5022 Chronic systolic (congestive) heart failure: Secondary | ICD-10-CM

## 2014-08-11 MED ORDER — CARVEDILOL 25 MG PO TABS: ORAL_TABLET | ORAL | Status: DC

## 2014-08-11 MED ORDER — FUROSEMIDE 40 MG PO TABS: 60.0000 mg | ORAL_TABLET | Freq: Every day | ORAL | Status: DC

## 2014-08-13 ENCOUNTER — Other Ambulatory Visit (HOSPITAL_COMMUNITY): Payer: Self-pay | Admitting: *Deleted

## 2014-08-18 DIAGNOSIS — D649 Anemia, unspecified: Secondary | ICD-10-CM | POA: Diagnosis not present

## 2014-08-18 DIAGNOSIS — N2581 Secondary hyperparathyroidism of renal origin: Secondary | ICD-10-CM | POA: Diagnosis not present

## 2014-08-18 DIAGNOSIS — N189 Chronic kidney disease, unspecified: Secondary | ICD-10-CM | POA: Diagnosis not present

## 2014-08-18 DIAGNOSIS — N184 Chronic kidney disease, stage 4 (severe): Secondary | ICD-10-CM | POA: Diagnosis not present

## 2014-08-18 DIAGNOSIS — I129 Hypertensive chronic kidney disease with stage 1 through stage 4 chronic kidney disease, or unspecified chronic kidney disease: Secondary | ICD-10-CM | POA: Diagnosis not present

## 2014-08-18 DIAGNOSIS — I770 Arteriovenous fistula, acquired: Secondary | ICD-10-CM | POA: Diagnosis not present

## 2014-08-27 ENCOUNTER — Ambulatory Visit: Payer: Medicare Other | Admitting: Family Medicine

## 2014-08-30 ENCOUNTER — Ambulatory Visit (INDEPENDENT_AMBULATORY_CARE_PROVIDER_SITE_OTHER): Payer: PPO | Admitting: Internal Medicine

## 2014-08-30 VITALS — BP 116/71 | HR 82 | Temp 98.3°F | Resp 16 | Ht 71.0 in | Wt 153.0 lb

## 2014-08-30 DIAGNOSIS — D571 Sickle-cell disease without crisis: Secondary | ICD-10-CM

## 2014-08-30 DIAGNOSIS — I5043 Acute on chronic combined systolic (congestive) and diastolic (congestive) heart failure: Secondary | ICD-10-CM | POA: Diagnosis not present

## 2014-08-30 DIAGNOSIS — N528 Other male erectile dysfunction: Secondary | ICD-10-CM | POA: Diagnosis not present

## 2014-08-30 LAB — CBC WITH DIFFERENTIAL/PLATELET
Basophils Absolute: 0.1 10*3/uL (ref 0.0–0.1)
Basophils Relative: 1 % (ref 0–1)
Eosinophils Absolute: 0.3 10*3/uL (ref 0.0–0.7)
Eosinophils Relative: 4 % (ref 0–5)
HCT: 23.1 % — ABNORMAL LOW (ref 39.0–52.0)
Hemoglobin: 8.2 g/dL — ABNORMAL LOW (ref 13.0–17.0)
Lymphocytes Relative: 23 % (ref 12–46)
Lymphs Abs: 1.8 10*3/uL (ref 0.7–4.0)
MCH: 39 pg — ABNORMAL HIGH (ref 26.0–34.0)
MCHC: 35.5 g/dL (ref 30.0–36.0)
MCV: 110 fL — ABNORMAL HIGH (ref 78.0–100.0)
MPV: 11.4 fL (ref 8.6–12.4)
Monocytes Absolute: 1.3 10*3/uL — ABNORMAL HIGH (ref 0.1–1.0)
Monocytes Relative: 17 % — ABNORMAL HIGH (ref 3–12)
Neutro Abs: 4.3 10*3/uL (ref 1.7–7.7)
Neutrophils Relative %: 55 % (ref 43–77)
Platelets: 188 10*3/uL (ref 150–400)
RBC: 2.1 MIL/uL — ABNORMAL LOW (ref 4.22–5.81)
RDW: 17.6 % — ABNORMAL HIGH (ref 11.5–15.5)
WBC: 7.8 10*3/uL (ref 4.0–10.5)

## 2014-08-30 LAB — COMPREHENSIVE METABOLIC PANEL
ALT: 15 U/L (ref 0–53)
AST: 28 U/L (ref 0–37)
Albumin: 4 g/dL (ref 3.5–5.2)
Alkaline Phosphatase: 68 U/L (ref 39–117)
BUN: 46 mg/dL — ABNORMAL HIGH (ref 6–23)
CO2: 20 mEq/L (ref 19–32)
Calcium: 8.5 mg/dL (ref 8.4–10.5)
Chloride: 106 mEq/L (ref 96–112)
Creat: 2.48 mg/dL — ABNORMAL HIGH (ref 0.50–1.35)
Glucose, Bld: 88 mg/dL (ref 70–99)
Potassium: 5 mEq/L (ref 3.5–5.3)
Sodium: 135 mEq/L (ref 135–145)
Total Bilirubin: 1.9 mg/dL — ABNORMAL HIGH (ref 0.2–1.2)
Total Protein: 6.7 g/dL (ref 6.0–8.3)

## 2014-08-30 LAB — RETICULOCYTES
ABS Retic: 134.4 10*3/uL (ref 19.0–186.0)
RBC.: 2.1 MIL/uL — ABNORMAL LOW (ref 4.22–5.81)
Retic Ct Pct: 6.4 % — ABNORMAL HIGH (ref 0.4–2.3)

## 2014-08-30 NOTE — Progress Notes (Signed)
Patient ID: Patrick [name], male   DOB: 1955/12/20, 59 y.o.   MRN: [id number]   Patrick [name], is a 59 y.o. male  [id number]  [id number]  DOB - 02/11/1956  CC:  Chief Complaint  Patient presents with  . Follow-up    Erectle Dysfunction    Sickle Cell Disease       HPI: Patrick [name] is a 59 y.o. male here today to follow up on Sickle Cell Disease. He reports that he has been doing well. He has been compliant with Hydrea and Folic acid. He intially received 1 dose of Aranesp 100 mcg, but  has not received any further Aranesp injections or blod transfusions recently sionce his Hb has remained above 8 g/dL. He had the fistula placed in anticipation of dialysis but reports that his Nephrologist does not feel that he needs dialysis at this time. He carries a diagnosis of retinopathy of Sickle Cell disease leading to legal blindness. He has been following closely with Dr. Zadie Rhine his Opthalmologist for his vision problems.   He also c/o premature ejaculation, but is able to achieve and maintain an erection.   Pt was last seen by Cardiology on December and was supposed to follow up in 2 months.  .  Patient has No headache, No chest pain, No abdominal pain - No Nausea, No new weakness tingling or numbness, No Cough - SOB.  No Known Allergies Past Medical History  Diagnosis Date  . Swelling of extremity, right   . Swelling abdomen   . Swelling of extremity, left   . Blood transfusion     "I've had a bunch of them"  . Blood dyscrasia   . Legally blind   . Sickle cell disease, type SS   . [id number])     "probably weekly" (01/13/2014)  . Gout   . Chronic kidney disease (CKD), stage III (moderate)   . CHF (congestive heart failure)     followed by hf clinic   Current Outpatient Prescriptions on File Prior to Visit  Medication Sig Dispense Refill  . allopurinol (ZYLOPRIM) 100 MG tablet Take 1 tablet (100 mg total) by mouth daily. 30 tablet 2  . aspirin EC 81 MG  tablet Take 81 mg by mouth daily.    . calcitRIOL (ROCALTROL) 0.25 MCG capsule Take 0.25 mcg by mouth 3 (three) times a week. Monday, Wednesday and Friday    . carvedilol (COREG) 25 MG tablet TAKE 1 TABLET (25 MG TOTAL) BY MOUTH 2 (TWO) TIMES DAILY WITH A MEAL. 60 tablet 3  . folic acid (FOLVITE) 1 MG tablet Take 1 mg by mouth daily.    . furosemide (LASIX) 40 MG tablet Take 1.5 tablets (60 mg total) by mouth daily. 45 tablet 6  . hydrALAZINE (APRESOLINE) 50 MG tablet Take 75 mg by mouth 3 (three) times daily.    . hydroxyurea (HYDREA) 500 MG capsule TAKE 1 CAPSULE (500 MG TOTAL) BY MOUTH DAILY. MAY TAKE WITH FOOD TO MINIMIZE GI SIDE EFFECTS. 30 capsule 3  . isosorbide mononitrate (IMDUR) 60 MG 24 hr tablet Take 1 tablet (60 mg total) by mouth daily. 30 tablet 3  . metolazone (ZAROXOLYN) 2.5 MG tablet TAKE 1 TABLET (2.5 MG TOTAL) BY MOUTH AS NEEDED. 8 tablet 6  . oxyCODONE (ROXICODONE) 5 MG immediate release tablet Take 1 tablet (5 mg total) by mouth every 4 (four) hours as needed for moderate pain or severe pain. (Patient not taking: Reported on 08/30/2014) 60 tablet 0   No  current facility-administered medications on file prior to visit.   Family History  Problem Relation Age of Onset  . Alcohol abuse Mother   . Liver disease Mother   . Cirrhosis Mother   . Heart attack Mother    History   Social History  . Marital Status: Divorced    Spouse Name: N/A  . Number of Children: N/A  . Years of Education: N/A   Occupational History  . Not on file.   Social History Main Topics  . Smoking status: Current Every Day Smoker -- 0.25 packs/day for 41 years    Types: Cigarettes  . Smokeless tobacco: Never Used  . Alcohol Use: 1.8 oz/week    1 Glasses of wine, 2 Cans of beer per week     Comment: occasional  . Drug Use: No  . Sexual Activity: Not Currently   Other Topics Concern  . Not on file   Social History Narrative    Review of Systems: Constitutional: Negative for fever,  chills, diaphoresis, activity change, appetite change and fatigue. HENT: Negative for ear pain, nosebleeds, congestion, facial swelling, rhinorrhea, neck pain, neck stiffness and ear discharge.  Eyes: Negative for pain, discharge, redness, itching and visual disturbance. Respiratory: Negative for cough, choking, chest tightness, shortness of breath, wheezing and stridor.  Cardiovascular: Negative for chest pain, palpitations and leg swelling. Gastrointestinal: Negative for abdominal distention. Genitourinary: Negative for dysuria, urgency, frequency, hematuria, flank pain, decreased urine volume, difficulty urinating and dyspareunia.  Musculoskeletal: Negative for back pain, joint swelling, arthralgia and gait problem. Neurological: Negative for dizziness, tremors, seizures, syncope, facial asymmetry, speech difficulty, weakness, light-headedness, numbness and headaches.  Hematological: Negative for adenopathy. Does not bruise/bleed easily. Psychiatric/Behavioral: Negative for hallucinations, behavioral problems, confusion, dysphoric mood, decreased concentration and agitation.     Objective:   Filed Vitals:   08/30/14 1337  BP: 116/71  Pulse: 82  Temp: 98.3 F (36.8 C)  Resp: 16    Physical Exam: Constitutional: Patient appears well-developed and well-nourished. No distress. HENT: Normocephalic, atraumatic, External right and left ear normal. Oropharynx is clear and moist.  Eyes: Conjunctivae and EOM are normal. PERRLA, no scleral icterus. Neck: Normal ROM. Neck supple. No JVD. No tracheal deviation. No thyromegaly. CVS: RRR, S1/S2 +, no murmurs, no gallops, no carotid bruit.  Pulmonary: Effort and breath sounds normal, no stridor, rhonchi, wheezes, rales.  Abdominal: Soft. BS +, no distension, tenderness, rebound or guarding.  Musculoskeletal: Normal range of motion. No edema and no tenderness.  Lymphadenopathy: No lymphadenopathy noted, cervical, inguinal or axillary Neuro:  Alert. Normal reflexes, muscle tone coordination. No cranial nerve deficit. Skin: Skin is warm and dry. No rash noted. Not diaphoretic. No erythema. No pallor. Psychiatric: Normal mood and affect. Behavior, judgment, thought content normal.  Lab Results  Component Value Date   WBC 8.4 05/26/2014   HGB 8.2* 05/26/2014   HCT 23.6* 05/26/2014   MCV 106.3* 05/26/2014   PLT 252 05/26/2014   Lab Results  Component Value Date   CREATININE 2.44* 06/22/2014   BUN 54* 06/22/2014   NA 137 06/22/2014   K 4.4 06/22/2014   CL 107 06/22/2014   CO2 21 06/22/2014    Lab Results  Component Value Date   HGBA1C <4.0 06/07/2011   Lipid Panel  No results found for: CHOL, TRIG, HDL, CHOLHDL, VLDL, LDLCALC     Assessment and plan:   1. Hb-SS disease without crisis:- Continue Hydrea 500 mg daily. We discussed the need for good hydration, moitoring of hydration  status, avoidance of heat, cold, stress, and infection triggers. We discussed the risks and benefits of Hydrea, including bone marrow suppression, the possibility of GI upset, skin ulcers, hair thinning, and teratogenicity. The patient was reminded of the need to seek medical attention of any symptoms of bleeding, anemia, or infection. I will check his labs today and if ANC, Hb  and reticulocytes in good shape will increase to 1000 mg  Alternating with 500 mg every other day. Continue folic acid 1 mg daily to prevent aplastic bone marrow crises.   - Pulmonary evaluation - Patient denies severe recurrent wheezes, shortness of breath with exercise, or persistent cough. If these symptoms develop, pulmonary function tests with spirometry will be ordered, and if abnormal, plan on referral to Pulmonology for further evaluation.  - Cardiac - Pt has chronic systolic and diastolic dysfunction and is under the care of Cardiology. He was last seen in October and needs to call for a follow up appointment.   - Eye - Pt is legally blind and has proliferative  retinopathy.He is followed by Dr. Zadie Rhine with next appointment scheduled for August of 2016.   - Immunization status - He is up to date on Pneumococcal and  Declines Prevnar and meningococcal vaccines today. Yearly influenza vaccination is up to date.  - Acute and chronic painful episodes -Pt has had very little use of pain medications. As a matter of fact he has not used any medications or filled a prescription since January.  -  Iron overload from chronic transfusion.  Last Ferritin levels were <1000.  - Vitamin D deficiency - managed by Nephrology secondary to renal failure.   The above recommendations are taken from the NIH Evidence-Based Management of Sickle Cell Disease: Expert Panel Report, 2014.   - CBC with Differential/Platelet - Reticulocytes  2. Acute on chronic combined systolic and diastolic CHF (congestive heart failure) - Pt to call to follow up with Cardiology. - Comprehensive metabolic panel  3. Other male erectile dysfunction - Discussed this issue with patient and he is having pre-mature ejaculation but without any ED. I have discussed the use of SSRI's particularly Paxil  4. Other Health Maintenance: Pt has ben referred to Dr. Benson Norway for his Colonoscopy but has not followed up. He is reminded to follow up for this.    Follow-up in 3 months.  The patient was given clear instructions to go to ER or return to medical center if symptoms don't improve, worsen or new problems develop. The patient verbalized understanding. The patient was told to call to get lab results if they haven't heard anything in the next week.     This note has been created with Surveyor, quantity. Any transcriptional errors are unintentional.    Patrick [name] A., MD [place], [place]   08/30/2014, 2:02 PM

## 2014-08-31 ENCOUNTER — Other Ambulatory Visit: Payer: Medicare Other

## 2014-08-31 ENCOUNTER — Other Ambulatory Visit: Payer: Self-pay | Admitting: Internal Medicine

## 2014-08-31 DIAGNOSIS — I5043 Acute on chronic combined systolic (congestive) and diastolic (congestive) heart failure: Secondary | ICD-10-CM

## 2014-08-31 DIAGNOSIS — Z0000 Encounter for general adult medical examination without abnormal findings: Secondary | ICD-10-CM

## 2014-08-31 DIAGNOSIS — I10 Essential (primary) hypertension: Secondary | ICD-10-CM

## 2014-08-31 LAB — LIPID PANEL
Cholesterol: 104 mg/dL (ref 0–200)
HDL: 43 mg/dL (ref >=40)
LDL Cholesterol: 46 mg/dL (ref 0–99)
Total CHOL/HDL Ratio: 2.4 Ratio
Triglycerides: 75 mg/dL (ref <150)
VLDL: 15 mg/dL (ref 0–40)

## 2014-09-06 ENCOUNTER — Other Ambulatory Visit (HOSPITAL_COMMUNITY): Payer: Self-pay | Admitting: Adult Health

## 2014-09-13 ENCOUNTER — Encounter: Payer: Self-pay | Admitting: Internal Medicine

## 2014-09-16 ENCOUNTER — Other Ambulatory Visit: Payer: Self-pay | Admitting: Internal Medicine

## 2014-11-04 ENCOUNTER — Other Ambulatory Visit: Payer: Self-pay | Admitting: Family Medicine

## 2014-11-04 ENCOUNTER — Other Ambulatory Visit (HOSPITAL_COMMUNITY): Payer: Self-pay | Admitting: Internal Medicine

## 2014-12-01 ENCOUNTER — Other Ambulatory Visit: Payer: Self-pay | Admitting: Family Medicine

## 2014-12-02 ENCOUNTER — Encounter: Payer: Self-pay | Admitting: Internal Medicine

## 2014-12-02 ENCOUNTER — Ambulatory Visit (INDEPENDENT_AMBULATORY_CARE_PROVIDER_SITE_OTHER): Payer: PPO | Admitting: Internal Medicine

## 2014-12-02 VITALS — BP 150/74 | HR 80 | Temp 98.1°F | Resp 16 | Ht 71.0 in | Wt 146.0 lb

## 2014-12-02 DIAGNOSIS — Z720 Tobacco use: Secondary | ICD-10-CM

## 2014-12-02 DIAGNOSIS — D6489 Other specified anemias: Secondary | ICD-10-CM | POA: Diagnosis not present

## 2014-12-02 DIAGNOSIS — Z0000 Encounter for general adult medical examination without abnormal findings: Secondary | ICD-10-CM | POA: Diagnosis not present

## 2014-12-02 DIAGNOSIS — Z23 Encounter for immunization: Secondary | ICD-10-CM

## 2014-12-02 DIAGNOSIS — D571 Sickle-cell disease without crisis: Secondary | ICD-10-CM

## 2014-12-02 DIAGNOSIS — I10 Essential (primary) hypertension: Secondary | ICD-10-CM | POA: Diagnosis not present

## 2014-12-02 NOTE — Patient Instructions (Signed)

## 2014-12-02 NOTE — Progress Notes (Signed)
Patient ID: Patrick [name], male   DOB: May 13, 1956, 59 y.o.   MRN: [id number]  MEDICARE ANNUAL WELLNESS VISIT   Assessment:  1. Immunization due - Pt UTD on all immunizations except the Prevnar-13 which is required for patients with Hb SS - Pneumococcal conjugate vaccine 13-valent  2. Essential hypertension - BP still mildly elevated. It is currently managed by Nephrology. Goal BP is <140/90. Will discuss with Nephrology.  3. Anemia due to other cause - Hb Currently stable at 8.2 g/dl  4. Hb-SS disease without crisis - Pt has been doing well and has not had any hospitalizations in the last 6 months for crisis. He uses minimal analgesic medications. - He is taking Rocalcitrol for Vitamin D deficiency.  -  Pt is under care of Dr. Zadie Rhine for retinopathy which has already led to legal blindness. - He has CHF and is under the care of Cardiology  5. Tobacco Use Disorder - Patient is in a pre-contemplative state and is not ready to quit smoking.Counseling provided on the impact of smoking particularly in light of Hb SS and CHF.    Plan:   During the course of the visit the patient was educated and counseled about appropriate screening and preventive services including:    Pneumococcal vaccine: Will receive Prevnar today  Influenza vaccine: Up to date  Td vaccine: Up to date  Screening electrocardiogram  Bone densitometry screening: Not currently indicated  Colorectal cancer screening: Pt has been referred and  will make an appointment with Dr. Benson Norway.   Diabetes screening: Hb A1c not indicated. No Hyperglycemia.  Glaucoma screening: Under care of Dr. Zadie Rhine  Nutrition counseling: Doing well. Weight maintained  Advanced directives: Pt to bring copy.  Screening recommendations, referrals: Vaccinations: Please see documentation below and orders this visit.  Nutrition assessed and recommended  Colonoscopy : Referral has been made to Dr. Benson Norway but patietn has not followed  up with appointment. Recommended yearly ophthalmology/optometry visit for glaucoma screening and checkup Recommended yearly dental visit for hygiene and checkup Advanced directives - requested  Conditions/risks identified: BMI: Discussed weight loss, diet, and increase physical activity.  Increase physical activity: AHA recommends 150 minutes of physical activity a week.  Medications reviewed Urinary Incontinence is not an issue: discussed non pharmacology and pharmacology options.  Fall risk: low- discussed PT, home fall assessment, medications.    Subjective:  Patrick [name] is a 59 y.o. male who presents for Medicare Annual Wellness Visit and complete physical.  Date of last medicare wellness visit is unknown.  He has had elevated blood pressure for many years. His blood pressure has been controlled at home, today their BP is BP: (!) 146/82 mmHg He does not workout. He denies chest pain, shortness of breath, dizziness.  He is not on cholesterol medication and denies myalgias. His cholesterol is at goal. The cholesterol last visit was:   Lab Results  Component Value Date   CHOL 104 08/31/2014   HDL 43 08/31/2014   LDLCALC 46 08/31/2014   TRIG 75 08/31/2014   CHOLHDL 2.4 08/31/2014   He does not have diabetes. He has been working on diet and exercise for prediabetes, and denies hyperglycemia, hypoglycemia , hypoglycemia , polydipsia, polyuria and weight loss. Last A1C in the office was:  Lab Results  Component Value Date   HGBA1C <4.0 06/07/2011   Patient is on Vitamin D supplement.   No results found for: VD25OH     Names of Other Physician/Practitioners you currently use: 1. [facility] here for primary care 2. Does not have a  dentist, last visit more than 2 years ago. Patient Care Team: Leana Gamer, MD as PCP - General (Internal Medicine) Hurman Horn, MD as Consulting Physician (Ophthalmology) Jamal Maes, MD as Consulting Physician  (Nephrology)   Medication Review: Current Outpatient Prescriptions on File Prior to Visit  Medication Sig Dispense Refill  . allopurinol (ZYLOPRIM) 100 MG tablet TAKE 1 TABLET BY MOUTH DAILY 30 tablet 2  . aspirin EC 81 MG tablet Take 81 mg by mouth daily.    . calcitRIOL (ROCALTROL) 0.25 MCG capsule Take 0.25 mcg by mouth 3 (three) times a week. Monday, Wednesday and Friday    . carvedilol (COREG) 25 MG tablet TAKE 1 TABLET (25 MG TOTAL) BY MOUTH 2 (TWO) TIMES DAILY WITH A MEAL. 60 tablet 3  . folic acid (FOLVITE) 1 MG tablet Take 1 mg by mouth daily.    . folic acid (FOLVITE) 1 MG tablet TAKE 1 TABLET (1 MG TOTAL) BY MOUTH DAILY. 30 tablet 10  . furosemide (LASIX) 40 MG tablet Take 1.5 tablets (60 mg total) by mouth daily. 45 tablet 6  . hydrALAZINE (APRESOLINE) 50 MG tablet Take 75 mg by mouth 3 (three) times daily.    . hydrALAZINE (APRESOLINE) 50 MG tablet TAKE 1.5 TABLETS (75 MG TOTAL) BY MOUTH 3 (THREE) TIMES DAILY. 135 tablet 3  . hydroxyurea (HYDREA) 500 MG capsule TAKE 1 CAPSULE (500 MG TOTAL) BY MOUTH DAILY. MAY TAKE WITH FOOD TO MINIMIZE GI SIDE EFFECTS. 30 capsule 3  . isosorbide mononitrate (IMDUR) 60 MG 24 hr tablet TAKE 1 TABLET (60 MG TOTAL) BY MOUTH DAILY. 30 tablet 3  . metolazone (ZAROXOLYN) 2.5 MG tablet TAKE 1 TABLET (2.5 MG TOTAL) BY MOUTH AS NEEDED. 8 tablet 6  . oxyCODONE (ROXICODONE) 5 MG immediate release tablet Take 1 tablet (5 mg total) by mouth every 4 (four) hours as needed for moderate pain or severe pain. 60 tablet 0   No current facility-administered medications on file prior to visit.    Current Problems (verified) Patient Active Problem List   Diagnosis Date Noted  . Muscle tightness-Right arm  05/27/2014  . Numbness of fingers-Right Hand 05/27/2014  . Arm pain, right 05/06/2014  . End stage renal disease 05/06/2014  . Chest pain 01/13/2014  . Vitamin D deficiency 09/25/2013  . Immunization due 09/25/2013  . CKD (chronic kidney disease), stage IV  09/25/2013  . Elevated uric acid in blood 09/25/2013  . Hb-SS disease without crisis 09/25/2013  . Anemia 09/25/2013  . Urinary hesitancy 08/31/2013  . Back pain, acute 08/27/2013  . Toe infection 08/27/2013  . Onychomycosis of toenail 08/27/2013  . Cellulitis 08/27/2013  . Hb-SS disease with crisis 08/27/2013  . Hyperglycemia 08/27/2013  . CHF (congestive heart failure) 08/19/2013  . Pancreatitis, acute 02/07/2013  . Cholelithiasis 02/07/2013  . Leukocytosis, unspecified 02/07/2013  . Nausea vomiting and diarrhea 02/07/2013  . Sickle cell anemia 11/21/2012  . Chronic CHF 01/17/2012  . UTI (lower urinary tract infection) 01/17/2012  . Chronic diastolic heart failure 0000000  . Chronic systolic heart failure 0000000  . Tobacco abuse 10/29/2011  . NSVT (nonsustained ventricular tachycardia) 10/21/2011  . Gout 10/19/2011  . Acute on chronic combined systolic and diastolic CHF (congestive heart failure) 10/17/2011  . Acute kidney failure 10/17/2011  . HTN (hypertension) 06/07/2011    Screening Tests Health Maintenance  Topic Date Due  . COLONOSCOPY  02/04/2008  . INFLUENZA VACCINE  01/17/2015  . TETANUS/TDAP  09/25/2023  . HIV Screening  Completed    Immunization History  Administered Date(s) Administered  . Influenza,inj,Quad PF,36+ Mos 03/17/2013, 03/08/2014  . Pneumococcal Polysaccharide-23 02/10/2013  . Tdap 09/24/2013    Preventative care: Last colonoscopy: Never had one  Prevnar13: Will obtain today     Medication List       This list is accurate as of: 12/02/14  3:51 PM.  Always use your most recent med list.               allopurinol 100 MG tablet  Commonly known as:  ZYLOPRIM  TAKE 1 TABLET BY MOUTH DAILY     aspirin EC 81 MG tablet  Take 81 mg by mouth daily.     calcitRIOL 0.25 MCG capsule  Commonly known as:  ROCALTROL  Take 0.25 mcg by mouth 3 (three) times a week. Monday, Wednesday and Friday     carvedilol 25 MG tablet  Commonly  known as:  COREG  TAKE 1 TABLET (25 MG TOTAL) BY MOUTH 2 (TWO) TIMES DAILY WITH A MEAL.     folic acid 1 MG tablet  Commonly known as:  FOLVITE  Take 1 mg by mouth daily.     folic acid 1 MG tablet  Commonly known as:  FOLVITE  TAKE 1 TABLET (1 MG TOTAL) BY MOUTH DAILY.     furosemide 40 MG tablet  Commonly known as:  LASIX  Take 1.5 tablets (60 mg total) by mouth daily.     hydrALAZINE 50 MG tablet  Commonly known as:  APRESOLINE  Take 75 mg by mouth 3 (three) times daily.     hydrALAZINE 50 MG tablet  Commonly known as:  APRESOLINE  TAKE 1.5 TABLETS (75 MG TOTAL) BY MOUTH 3 (THREE) TIMES DAILY.     hydroxyurea 500 MG capsule  Commonly known as:  HYDREA  TAKE 1 CAPSULE (500 MG TOTAL) BY MOUTH DAILY. MAY TAKE WITH FOOD TO MINIMIZE GI SIDE EFFECTS.     isosorbide mononitrate 60 MG 24 hr tablet  Commonly known as:  IMDUR  TAKE 1 TABLET (60 MG TOTAL) BY MOUTH DAILY.     metolazone 2.5 MG tablet  Commonly known as:  ZAROXOLYN  TAKE 1 TABLET (2.5 MG TOTAL) BY MOUTH AS NEEDED.     oxyCODONE 5 MG immediate release tablet  Commonly known as:  ROXICODONE  Take 1 tablet (5 mg total) by mouth every 4 (four) hours as needed for moderate pain or severe pain.        Past Surgical History  Procedure Laterality Date  . Cardiac catheterization  05/2011  . Bascilic vein transposition Right 04/12/2014    Procedure: BASILIC VEIN TRANSPOSITION;  Surgeon: Elam Dutch, MD;  Location: [facility];  Service: Vascular;  Laterality: Right;  . Left and right heart catheterization with coronary angiogram N/A 06/11/2011    Procedure: LEFT AND RIGHT HEART CATHETERIZATION WITH CORONARY ANGIOGRAM;  Surgeon: Larey Dresser, MD;  Location[facility] CATH LAB;  Service: Cardiovascular;  Laterality: N/A;   Family History  Problem Relation Age of Onset  . Alcohol abuse Mother   . Liver disease Mother   . Cirrhosis Mother   . Heart attack Mother     History reviewed: allergies, current medications, past  family history, past medical history, past social history, past surgical history and problem list   Risk Factors: Osteoporosis/FallRisk: dietary calcium and/or vitamin D deficiency, hyperparathyroidism and Chronic renal failure In the past  year have you fallen or had a near fall?:No History of fracture in the past year: no  Tobacco History  Substance Use Topics  . Smoking status: Current Every Day Smoker -- 0.25 packs/day for 41 years    Types: Cigarettes  . Smokeless tobacco: Never Used  . Alcohol Use: 1.8 oz/week    1 Glasses of wine, 2 Cans of beer per week     Comment: occasional   He does smoke.  Patient is not a former smoker. Are there smokers in your home (other than you)?  Yes  Alcohol Current alcohol use: social drinker  Caffeine Current caffeine use: coffee 2 /day  Exercise Current exercise: bicycling and walking  Nutrition/Diet Current diet: in general, a "healthy" diet    Cardiac risk factors: family history of premature cardiovascular disease, hypertension, male gender and smoking/ tobacco exposure.  Depression Screen (Note: if answer to either of the following is "Yes", a more complete depression screening is indicated)   Q1: Over the past two weeks, have you felt down, depressed or hopeless? No  Q2: Over the past two weeks, have you felt little interest or pleasure in doing things? No  Have you lost interest or pleasure in daily life? No  Do you often feel hopeless? No  Do you cry easily over simple problems? No  Activities of Daily Living In your present state of health, do you have any difficulty performing the following activities?:  Driving? Pt is legally blind Managing money?  No Feeding yourself? No Getting from bed to chair? No Climbing a flight of stairs? No Preparing food and eating?: No Bathing or showering? No Getting dressed: No Getting to the toilet? No Using the toilet:No Moving around from place to place: No In the past year have  you fallen or had a near fall?:No   Are you sexually active?  Yes  Do you have more than one partner?  No  Vision Difficulties: Yes. Pt legally blind  Hearing Difficulties: No Do you often ask people to speak up or repeat themselves? No Do you experience ringing or noises in your ears? No Do you have difficulty understanding soft or whispered voices? No  Cognition  Do you feel that you have a problem with memory?No  Do you often misplace items? No  Do you feel safe at home?  Yes  Advanced directives Does patient have a [place]? Yes Does patient have a Living Will? Yes   Objective:     Blood pressure 146/82, pulse 80, temperature 98.1 F (36.7 C), temperature source Oral, resp. rate 16, height 5\' 11"  (1.803 m), weight 146 lb (66.225 kg). Body mass index is 20.37 kg/(m^2).  General appearance: alert, no distress, WD/WN, male Cognitive Testing  Alert? Yes  Normal Appearance?Yes  Oriented to person? Yes  Place? Yes   Time? Yes  Recall of three objects?  Yes  Can perform simple calculations? Yes  Displays appropriate judgment?Yes  Can read the correct time from a watch face?Yes   Medicare Attestation I have personally reviewed: The patient's medical and social history Their use of alcohol, tobacco or illicit drugs Their current medications and supplements The patient's functional ability including ADLs,fall risks, home safety risks, cognitive, and hearing and visual impairment Diet and physical activities Evidence for depression or mood disorders  The patient's weight, height, BMI, and visual acuity have been recorded in the chart.  I have made referrals, counseling, and provided education to the patient based on review  of the above and I have provided the patient with a written personalized care plan for preventive services.     Traeson Dusza A., MD   12/02/2014

## 2014-12-14 DIAGNOSIS — N2581 Secondary hyperparathyroidism of renal origin: Secondary | ICD-10-CM | POA: Diagnosis not present

## 2014-12-14 DIAGNOSIS — D649 Anemia, unspecified: Secondary | ICD-10-CM | POA: Diagnosis not present

## 2014-12-14 DIAGNOSIS — N189 Chronic kidney disease, unspecified: Secondary | ICD-10-CM | POA: Diagnosis not present

## 2015-02-17 ENCOUNTER — Encounter: Payer: Self-pay | Admitting: Family Medicine

## 2015-02-17 ENCOUNTER — Ambulatory Visit (INDEPENDENT_AMBULATORY_CARE_PROVIDER_SITE_OTHER): Payer: PPO | Admitting: Family Medicine

## 2015-02-17 VITALS — BP 141/82 | HR 83 | Temp 98.1°F | Resp 16 | Ht 71.0 in | Wt 153.0 lb

## 2015-02-17 DIAGNOSIS — T733XXA Exhaustion due to excessive exertion, initial encounter: Secondary | ICD-10-CM

## 2015-02-17 DIAGNOSIS — D571 Sickle-cell disease without crisis: Secondary | ICD-10-CM | POA: Diagnosis not present

## 2015-02-17 LAB — COMPLETE METABOLIC PANEL WITH GFR
ALT: 16 U/L (ref 9–46)
AST: 38 U/L — ABNORMAL HIGH (ref 10–35)
Albumin: 4 g/dL (ref 3.6–5.1)
Alkaline Phosphatase: 65 U/L (ref 40–115)
BUN: 37 mg/dL — ABNORMAL HIGH (ref 7–25)
CO2: 17 mmol/L — ABNORMAL LOW (ref 20–31)
Calcium: 8 mg/dL — ABNORMAL LOW (ref 8.6–10.3)
Chloride: 108 mmol/L (ref 98–110)
Creat: 2.32 mg/dL — ABNORMAL HIGH (ref 0.70–1.33)
GFR, Est African American: 35 mL/min — ABNORMAL LOW (ref >=60)
GFR, Est Non African American: 30 mL/min — ABNORMAL LOW (ref >=60)
Glucose, Bld: 84 mg/dL (ref 65–99)
Potassium: 5.3 mmol/L (ref 3.5–5.3)
Sodium: 135 mmol/L (ref 135–146)
Total Bilirubin: 2.2 mg/dL — ABNORMAL HIGH (ref 0.2–1.2)
Total Protein: 6.5 g/dL (ref 6.1–8.1)

## 2015-02-17 LAB — LIPID PANEL
Cholesterol: 114 mg/dL — ABNORMAL LOW (ref 125–200)
HDL: 31 mg/dL — ABNORMAL LOW (ref >=40)
LDL Cholesterol: 68 mg/dL (ref <130)
Total CHOL/HDL Ratio: 3.7 Ratio (ref <=5.0)
Triglycerides: 76 mg/dL (ref <150)
VLDL: 15 mg/dL (ref <30)

## 2015-02-17 LAB — TSH: TSH: 2.301 u[IU]/mL (ref 0.350–4.500)

## 2015-02-17 MED ORDER — POLYETHYLENE GLYCOL 3350 17 GM/SCOOP PO POWD: 17.0000 g | Freq: Every day | ORAL | Status: DC

## 2015-02-17 NOTE — Patient Instructions (Addendum)
You can go from here to [facility] for a chest x-ray. We will let you know if we find any reason for your fatigue. I am providing a handout on constipation and will send in a prescription for medication I am referring you for a colonscopy. Someone will call to schedule that Follow-up in 2 weeks if not feeling better. We will call with your results. About Constipation  Constipation Overview Constipation is the most common gastrointestinal complaint - about 4 million Americans experience constipation and make 2.5 million physician visits a year to get help for the problem.  Constipation can occur when the colon absorbs too much water, the colon's muscle contraction is slow or sluggish, and/or there is delayed transit time through the colon.  The result is stool that is hard and dry.  Indicators of constipation include straining during bowel movements greater than 25% of the time, having fewer than three bowel movements per week, and/or the feeling of incomplete evacuation.  There are established guidelines ([place] II ) for defining constipation. A person needs to have two or more of the following symptoms for at least 12 weeks (not necessarily consecutive) in the preceding 12 months: . Straining in  greater than 25% of bowel movements . Lumpy or hard stools in greater than 25% of bowel movements . Sensation of incomplete emptying in greater than 25% of bowel movements . Sensation of anorectal obstruction/blockade in greater than 25% of bowel movements . Manual maneuvers to help empty greater than 25% of bowel movements (e.g., digital evacuation, support of the pelvic floor)  . Less than  3 bowel movements/week . Loose stools are not present, and criteria for irritable bowel syndrome are insufficient  Common Causes of Constipation . Lack of fiber in your diet . Lack of physical activity . Medications, including iron and calcium supplements  . Dairy intake . Dehydration . Abuse of  laxatives  Travel  Irritable Bowel Syndrome  Pregnancy  Luteal phase of menstruation (after ovulation and before menses)  Colorectal problems  Intestinal Dysfunction  Treating Constipation  There are several ways of treating constipation, including changes to diet and exercise, use of laxatives, adjustments to the pelvic floor, and scheduled toileting.  These treatments include: . increasing fiber and fluids in the diet  . increasing physical activity . learning muscle coordination   learning proper toileting techniques and toileting modifications   designing and sticking  to a toileting schedule     2007, [employer] Doc.22

## 2015-02-17 NOTE — Progress Notes (Signed)
Patient ID: Patrick [name], male   DOB: 1955-07-26, 59 y.o.   MRN: [id number]   Patrick [name], is a 59 y.o. male  [id number]  [id number]  DOB - 26-Apr-1956  CC:  Chief Complaint  Patient presents with  . Fatigue    symptoms x 1 week. abdomen feels "bloated"        HPI: Patrick [name] is a 59 y.o. male here with a complaint of a one week history of fatigue, shortness of breath and constipation with bloating. He has a history of sickle cell disease, chronic kidney disease stage III, congestive heart failure, gout, his current medications are allopurinol 100 daily, aspirin 81 daily, Calcitrol 0.25 mg 3 times a week ,carvedilol 25 mg twice daily,folic acid 1 mg daily as 40 mg daily Apresoline 75 mg 3 times a day isosorbide 10 mg daily. He has a prescription for oxycodone for his sickle cell pain that he has not used in months. He reports that he normally walks to the gym doesn't exercise without difficulty starting last Thursday he was not able to do so reaction had to sit down and rest before he got fair due to fatigue and shortness of breath. He reports feeling bloated and constipated and not having a bowel movement in about 2 weeks he has tried some over-the-counter Dulcolax. No Known Allergies Past Medical History  Diagnosis Date  . Swelling of extremity, right   . Swelling abdomen   . Swelling of extremity, left   . Blood transfusion     "I've had a bunch of them"  . Blood dyscrasia   . Legally blind   . Sickle cell disease, type SS   . [id number])     "probably weekly" (01/13/2014)  . Gout   . Chronic kidney disease (CKD), stage III (moderate)   . CHF (congestive heart failure)     [facility]   Current Outpatient Prescriptions on File Prior to Visit  Medication Sig Dispense Refill  . allopurinol (ZYLOPRIM) 100 MG tablet TAKE 1 TABLET BY MOUTH DAILY 30 tablet 2  . aspirin EC 81 MG tablet Take 81 mg by mouth daily.    . calcitRIOL (ROCALTROL) 0.25  MCG capsule Take 0.25 mcg by mouth 3 (three) times a week. Monday, Wednesday and Friday    . carvedilol (COREG) 25 MG tablet TAKE 1 TABLET (25 MG TOTAL) BY MOUTH 2 (TWO) TIMES DAILY WITH A MEAL. 60 tablet 3  . folic acid (FOLVITE) 1 MG tablet Take 1 mg by mouth daily.    . folic acid (FOLVITE) 1 MG tablet TAKE 1 TABLET (1 MG TOTAL) BY MOUTH DAILY. 30 tablet 10  . furosemide (LASIX) 40 MG tablet Take 1.5 tablets (60 mg total) by mouth daily. 45 tablet 6  . hydrALAZINE (APRESOLINE) 50 MG tablet Take 75 mg by mouth 3 (three) times daily.    . hydrALAZINE (APRESOLINE) 50 MG tablet TAKE 1.5 TABLETS (75 MG TOTAL) BY MOUTH 3 (THREE) TIMES DAILY. 135 tablet 3  . hydroxyurea (HYDREA) 500 MG capsule TAKE 1 CAPSULE (500 MG TOTAL) BY MOUTH DAILY. MAY TAKE WITH FOOD TO MINIMIZE GI SIDE EFFECTS. 30 capsule 3  . isosorbide mononitrate (IMDUR) 60 MG 24 hr tablet TAKE 1 TABLET (60 MG TOTAL) BY MOUTH DAILY. 30 tablet 3  . metolazone (ZAROXOLYN) 2.5 MG tablet TAKE 1 TABLET (2.5 MG TOTAL) BY MOUTH AS NEEDED. 8 tablet 6  . oxyCODONE (ROXICODONE) 5 MG immediate release tablet Take 1 tablet (5 mg total)  by mouth every 4 (four) hours as needed for moderate pain or severe pain. 60 tablet 0   No current facility-administered medications on file prior to visit.   Family History  Problem Relation Age of Onset  . Alcohol abuse Mother   . Liver disease Mother   . Cirrhosis Mother   . Heart attack Mother    Social History   Social History  . Marital Status: Divorced    Spouse Name: N/A  . Number of Children: N/A  . Years of Education: N/A   Occupational History  . Not on file.   Social History Main Topics  . Smoking status: Current Every Day Smoker -- 0.25 packs/day for 41 years    Types: Cigarettes  . Smokeless tobacco: Never Used  . Alcohol Use: 1.8 oz/week    1 Glasses of wine, 2 Cans of beer per week     Comment: occasional  . Drug Use: No  . Sexual Activity: Not Currently   Other Topics Concern  .  Not on file   Social History Narrative    Review of Systems: Constitutional: Negative for fever, chills, appetite change, weight loss. Positive for fatigue. HENT: Negative for ear pain, ear discharge.nose bleeds Eyes: Negative for pain, discharge, redness, itching and visual disturbance. He is legallly blind. Neck: Negative for pain, stiffness Respiratory: Negative for cough. Positive for shortness of breath with usual exertion. Cardiovascular: Negative for chest pain, palpitations and leg swelling. Gastrointestinal: Positive  for abdominal distention, abdominal pain, and constipation.nausea, vomiting, diarrhea. Genitourinary: Negative for dysuria, urgency, frequency, hematuria, flank pain,  Musculoskeletal: Negative for back pain, joint pain, joint  swelling, arthralgia and gait problem.Negative for weakness. Neurological: Negative for dizziness, tremors, seizures, syncope,   light-headedness, numbness and headaches.  Hematological: Negative for easy bruising or bleeding Psychiatric/Behavioral: Negative for depression, anxiety, decreased concentration, confusion    Objective:   Filed Vitals:   02/17/15 1017  BP: 141/82  Pulse: 83  Temp: 98.1 F (36.7 C)  Resp: 16    Physical Exam: Constitutional: Patient appears well-developed and well-nourished. No distress.Appears fatigued HENT: Normocephalic, atraumatic, External right and left ear normal. Oropharynx is clear and moist.  Eyes: Conjunctivae and EOM are normal. PERRLA, no scleral icterus. Neck: Normal ROM. Neck supple. No lymphadenopathy, No thyromegaly. CVS: RRR, S1/S2 +, no murmurs, no gallops, no rubs Pulmonary: Effort and breath no stridor, rhonchi, wheezes. There are adventious sounds in the lungs bilaterally in the lower lobes  Abdominal: Soft. Normoactive BS, tenderness, rebound or guarding. His lower abdomen does feel slightly distended. Musculoskeletal: Normal range of motion. No edema and no tenderness.  Neuro:  Alert.Normal muscle tone coordination. Non-focal Skin: Skin is warm and dry. No rash noted. Not diaphoretic. No erythema. No pallor. Psychiatric: Normal  Behavior, judgment, thought content normal.Appears to have depressed mood.  Lab Results  Component Value Date   WBC 7.8 08/30/2014   HGB 8.2* 08/30/2014   HCT 23.1* 08/30/2014   MCV 110.0* 08/30/2014   PLT 188 08/30/2014   Lab Results  Component Value Date   CREATININE 2.48* 08/30/2014   BUN 46* 08/30/2014   NA 135 08/30/2014   K 5.0 08/30/2014   CL 106 08/30/2014   CO2 20 08/30/2014    Lab Results  Component Value Date   HGBA1C <4.0 06/07/2011   Lipid Panel     Component Value Date/Time   CHOL 104 08/31/2014 0854   TRIG 75 08/31/2014 0854   HDL 43 08/31/2014 0854   CHOLHDL 2.4  08/31/2014 0854   VLDL 15 08/31/2014 0854   LDLCALC 46 08/31/2014 0854       Assessment and plan:   1. Fatigue due to excessive exertion, initial encounter  - COMPLETE METABOLIC PANEL WITH GFR - CBC with Differential - TSH - Lipid panel - Vitamin D 1,25 dihydroxy   Health maintenace.  -Colonscopy referral sent -PSA done today.  - Ambulatory referral to Gastroenterology  2. Hb-SS disease without crisis  - Follow-up if pain management becomes an issue -Take hydrea and folic acid regularly.   Return if symptoms worsen or fail to improve.  The patient was given clear instructions to go to ER or return to medical center if symptoms don't improve, worsen or new problems develop. The patient verbalized understanding.      Micheline Chapman, MSN, FNP-BC   02/17/2015, 11:25 AM

## 2015-02-18 LAB — CBC WITH DIFFERENTIAL/PLATELET
Basophils Absolute: 0.1 10*3/uL (ref 0.0–0.1)
Basophils Relative: 2 % — ABNORMAL HIGH (ref 0–1)
Eosinophils Absolute: 0.2 10*3/uL (ref 0.0–0.7)
Eosinophils Relative: 3 % (ref 0–5)
HCT: 22.6 % — ABNORMAL LOW (ref 39.0–52.0)
Hemoglobin: 7.8 g/dL — ABNORMAL LOW (ref 13.0–17.0)
Lymphocytes Relative: 22 % (ref 12–46)
Lymphs Abs: 1.3 10*3/uL (ref 0.7–4.0)
MCH: 39.2 pg — ABNORMAL HIGH (ref 26.0–34.0)
MCHC: 34.5 g/dL (ref 30.0–36.0)
MCV: 113.6 fL — ABNORMAL HIGH (ref 78.0–100.0)
MPV: 11.7 fL (ref 8.6–12.4)
Monocytes Absolute: 1 10*3/uL (ref 0.1–1.0)
Monocytes Relative: 17 % — ABNORMAL HIGH (ref 3–12)
Neutro Abs: 3.4 10*3/uL (ref 1.7–7.7)
Neutrophils Relative %: 56 % (ref 43–77)
Platelets: 158 10*3/uL (ref 150–400)
RBC: 1.99 MIL/uL — ABNORMAL LOW (ref 4.22–5.81)
RDW: 18.7 % — ABNORMAL HIGH (ref 11.5–15.5)
WBC: 6.1 10*3/uL (ref 4.0–10.5)

## 2015-02-18 LAB — PSA: PSA: 0.24 ng/mL (ref <=4.00)

## 2015-02-19 LAB — VITAMIN D 1,25 DIHYDROXY
Vitamin D 1, 25 (OH)2 Total: 24 pg/mL (ref 18–72)
Vitamin D2 1, 25 (OH)2: <8 pg/mL
Vitamin D3 1, 25 (OH)2: 24 pg/mL

## 2015-02-20 ENCOUNTER — Other Ambulatory Visit: Payer: Self-pay | Admitting: Internal Medicine

## 2015-03-01 ENCOUNTER — Other Ambulatory Visit: Payer: PPO

## 2015-03-03 ENCOUNTER — Ambulatory Visit (INDEPENDENT_AMBULATORY_CARE_PROVIDER_SITE_OTHER): Payer: PPO | Admitting: Family Medicine

## 2015-03-03 VITALS — BP 141/78 | HR 72 | Temp 98.2°F | Resp 16 | Ht 71.0 in | Wt 146.0 lb

## 2015-03-03 DIAGNOSIS — T733XXA Exhaustion due to excessive exertion, initial encounter: Secondary | ICD-10-CM

## 2015-03-03 NOTE — Patient Instructions (Signed)
Continue current treatment Follow-up as needed.

## 2015-03-03 NOTE — Progress Notes (Signed)
Patient ID: Patrick [name], male   DOB: 17-Mar-1956, 59 y.o.   MRN: [id number]   Jacobson Milan, is a 59 y.o. male  [id number]  [id number]  DOB - 1956-02-14  CC:  Chief Complaint  Patient presents with  . Follow-up    FATIGUE        HPI: Patrick [name] is a 59 y.o. male here for follow-up of fatigue. He was seen about two weeks ago for fatigue. Bloodworki showed no reason for the change in his energy level. He reports he is feeling back to normal. He has recently seen his nephrologist who increased his lasix to relieve fluid retention.He reports no other complaints today. He has very little trouble with his Sickle Cell and has not had a refill on pain medication since Dec. 2015. No Known Allergies Past Medical History  Diagnosis Date  . Swelling of extremity, right   . Swelling abdomen   . Swelling of extremity, left   . Blood transfusion     "I've had a bunch of them"  . Blood dyscrasia   . Legally blind   . Sickle cell disease, type SS   . [id number])     "probably weekly" (01/13/2014)  . Gout   . Chronic kidney disease (CKD), stage III (moderate)   . CHF (congestive heart failure)     followed by [facility] clinic   Current Outpatient Prescriptions on File Prior to Visit  Medication Sig Dispense Refill  . allopurinol (ZYLOPRIM) 100 MG tablet TAKE 1 TABLET BY MOUTH DAILY 30 tablet 2  . aspirin EC 81 MG tablet Take 81 mg by mouth daily.    . calcitRIOL (ROCALTROL) 0.25 MCG capsule Take 0.25 mcg by mouth 3 (three) times a week. Monday, Wednesday and Friday    . carvedilol (COREG) 25 MG tablet TAKE 1 TABLET (25 MG TOTAL) BY MOUTH 2 (TWO) TIMES DAILY WITH A MEAL. 60 tablet 3  . folic acid (FOLVITE) 1 MG tablet Take 1 mg by mouth daily.    . folic acid (FOLVITE) 1 MG tablet TAKE 1 TABLET (1 MG TOTAL) BY MOUTH DAILY. 30 tablet 10  . furosemide (LASIX) 40 MG tablet Take 1.5 tablets (60 mg total) by mouth daily. 45 tablet 6  . hydrALAZINE (APRESOLINE) 50 MG tablet  Take 75 mg by mouth 3 (three) times daily.    . hydrALAZINE (APRESOLINE) 50 MG tablet TAKE 1.5 TABLETS (75 MG TOTAL) BY MOUTH 3 (THREE) TIMES DAILY. 135 tablet 3  . hydroxyurea (HYDREA) 500 MG capsule TAKE 1 CAPSULE (500 MG TOTAL) BY MOUTH DAILY. MAY TAKE WITH FOOD TO MINIMIZE GI SIDE EFFECTS. 30 capsule 3  . isosorbide mononitrate (IMDUR) 60 MG 24 hr tablet TAKE 1 TABLET (60 MG TOTAL) BY MOUTH DAILY. 30 tablet 3  . metolazone (ZAROXOLYN) 2.5 MG tablet TAKE 1 TABLET (2.5 MG TOTAL) BY MOUTH AS NEEDED. 8 tablet 6  . oxyCODONE (ROXICODONE) 5 MG immediate release tablet Take 1 tablet (5 mg total) by mouth every 4 (four) hours as needed for moderate pain or severe pain. 60 tablet 0  . polyethylene glycol powder (GLYCOLAX/MIRALAX) powder Take 17 g by mouth daily. 3350 g 1   No current facility-administered medications on file prior to visit.   Family History  Problem Relation Age of Onset  . Alcohol abuse Mother   . Liver disease Mother   . Cirrhosis Mother   . Heart attack Mother    Social History   Social History  . Marital Status: Divorced  Spouse Name: N/A  . Number of Children: N/A  . Years of Education: N/A   Occupational History  . Not on file.   Social History Main Topics  . Smoking status: Current Every Day Smoker -- 0.25 packs/day for 41 years    Types: Cigarettes  . Smokeless tobacco: Never Used  . Alcohol Use: 1.8 oz/week    1 Glasses of wine, 2 Cans of beer per week     Comment: occasional  . Drug Use: No  . Sexual Activity: Not Currently   Other Topics Concern  . Not on file   Social History Narrative    Review of Systems: Constitutional: Negative for fever, chills, appetite change, weight loss,  fatigue. HENT: Negative for ear pain, ear discharge.nose bleeds Eyes: Negative for pain, discharge, redness, itching and visual disturbance.Legally blind Neck: Negative for pain, stiffness Respiratory: Negative for cough, shortness of breath,   Cardiovascular:  Negative for chest pain, palpitations and leg swelling. Gastrointestinal: Negative for abdominal distention, abdominal pain, nausea, vomiting, diarrhea, constipations Genitourinary: Negative for dysuria, urgency, frequency, hematuria, flank pain,  Musculoskeletal: Negative for back pain, joint pain, joint  swelling, arthralgia and gait problem.Negative for weakness. Neurological: Negative for dizziness, tremors, seizures, syncope,   light-headedness, numbness and headaches.  Hematological: Negative for easy bruising or bleeding Psychiatric/Behavioral: Negative for depression, anxiety, decreased concentration, confusion    Objective:   Filed Vitals:   03/03/15 0950  BP: 141/78  Pulse: 72  Temp: 98.2 F (36.8 C)  Resp: 16    Physical Exam: Constitutional: Patient appears well-developed and well-nourished. No distress. HENT: Normocephalic, atraumatic, External right and left ear normal. Oropharynx is clear and moist. Teeth in poor repair Eyes: Conjunctivae and EOM are normal. PERRLA, no scleral icterus. Neck: Normal ROM. Neck supple. No lymphadenopathy, No thyromegaly. CVS: RRR, S1/S2 +, no murmurs, no gallops, no rubs Pulmonary: Effort and breath sounds normal, no stridor, rhonchi, wheezes, rales.  Abdominal: Soft. Normoactive BS,, no distension, tenderness, rebound or guarding.  Musculoskeletal: Normal range of motion. No edema and no tenderness.  Neuro: Alert.Normal muscle tone coordination. Non-focal Skin: Skin is warm and dry. No rash noted. Not diaphoretic. No erythema. No pallor. Psychiatric: Normal mood and affect. Behavior, judgment, thought content normal.  Lab Results  Component Value Date   WBC 6.1 02/17/2015   HGB 7.8* 02/17/2015   HCT 22.6* 02/17/2015   MCV 113.6* 02/17/2015   PLT 158 02/17/2015   Lab Results  Component Value Date   CREATININE 2.32* 02/17/2015   BUN 37* 02/17/2015   NA 135 02/17/2015   K 5.3 02/17/2015   CL 108 02/17/2015   CO2 17*  02/17/2015    Lab Results  Component Value Date   HGBA1C <4.0 06/07/2011   Lipid Panel     Component Value Date/Time   CHOL 114* 02/17/2015 1105   TRIG 76 02/17/2015 1105   HDL 31* 02/17/2015 1105   CHOLHDL 3.7 02/17/2015 1105   VLDL 15 02/17/2015 1105   LDLCALC 68 02/17/2015 1105       Assessment and plan:    1. Fatigue, resolved -Follow-up as needed.  Return if symptoms worsen or fail to improve.  The patient was given clear instructions to go to ER or return to medical center if symptoms don't improve, worsen or new problems develop. The patient verbalized understanding.      Micheline Chapman, MSN, FNP-BC   03/03/2015, 11:40 AM

## 2015-03-04 ENCOUNTER — Encounter: Payer: Self-pay | Admitting: Family Medicine

## 2015-03-05 ENCOUNTER — Other Ambulatory Visit: Payer: Self-pay | Admitting: Internal Medicine

## 2015-03-05 ENCOUNTER — Other Ambulatory Visit (HOSPITAL_COMMUNITY): Payer: Self-pay | Admitting: Internal Medicine

## 2015-03-07 ENCOUNTER — Ambulatory Visit: Payer: PPO | Admitting: Family Medicine

## 2015-05-06 ENCOUNTER — Other Ambulatory Visit (HOSPITAL_COMMUNITY): Payer: Self-pay | Admitting: Internal Medicine

## 2015-05-31 ENCOUNTER — Other Ambulatory Visit (HOSPITAL_COMMUNITY): Payer: Self-pay | Admitting: Internal Medicine

## 2015-06-10 IMAGING — CR DG CHEST 2V
2 series · 2 of 2 positions shown · non-contrast
Comparison: DG CHEST 2 VIEW dated 03/31/2013

CLINICAL DATA: Short of breath for 4 days.  CHF.

EXAM:
CHEST  2 VIEW

[w chest pa]
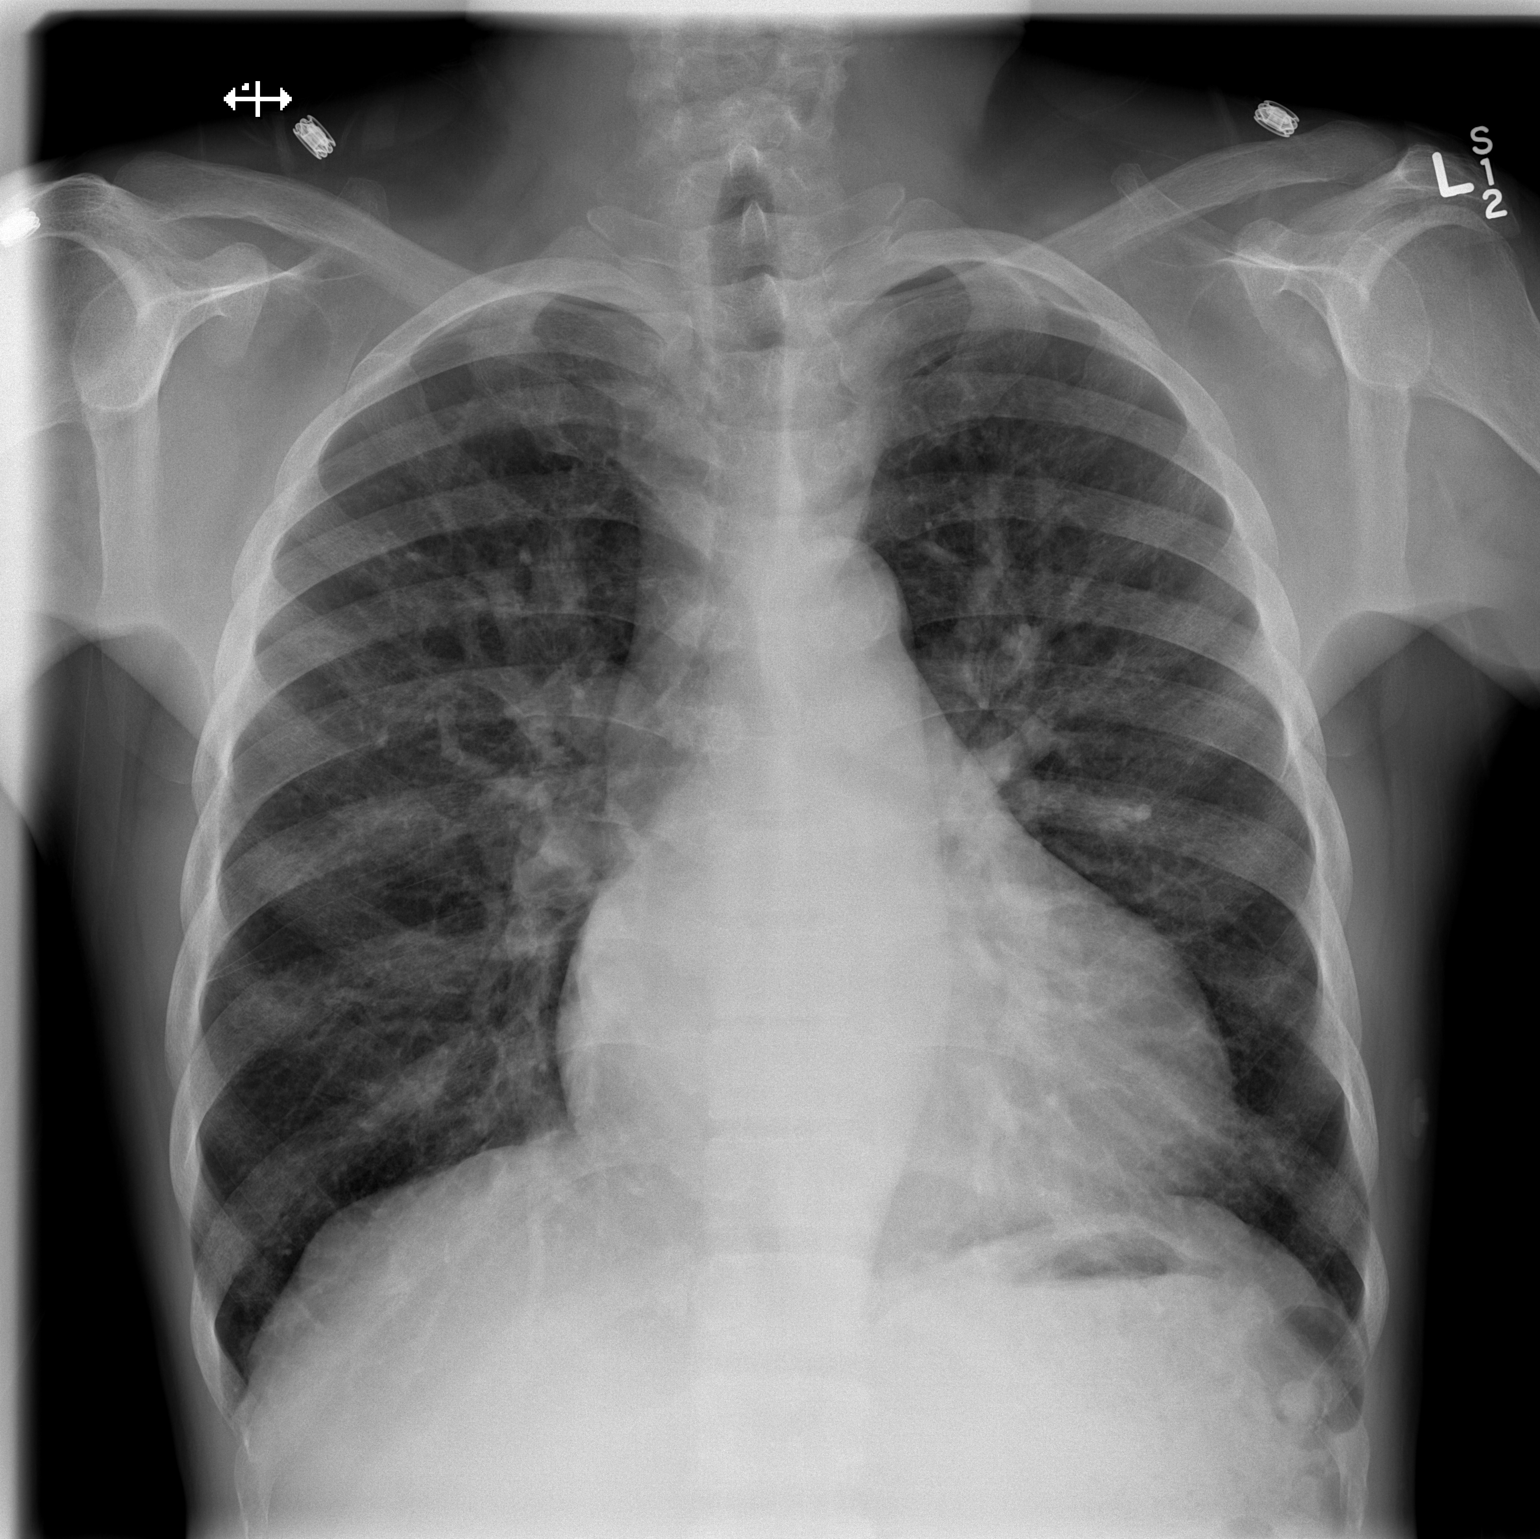

[w chest lat]
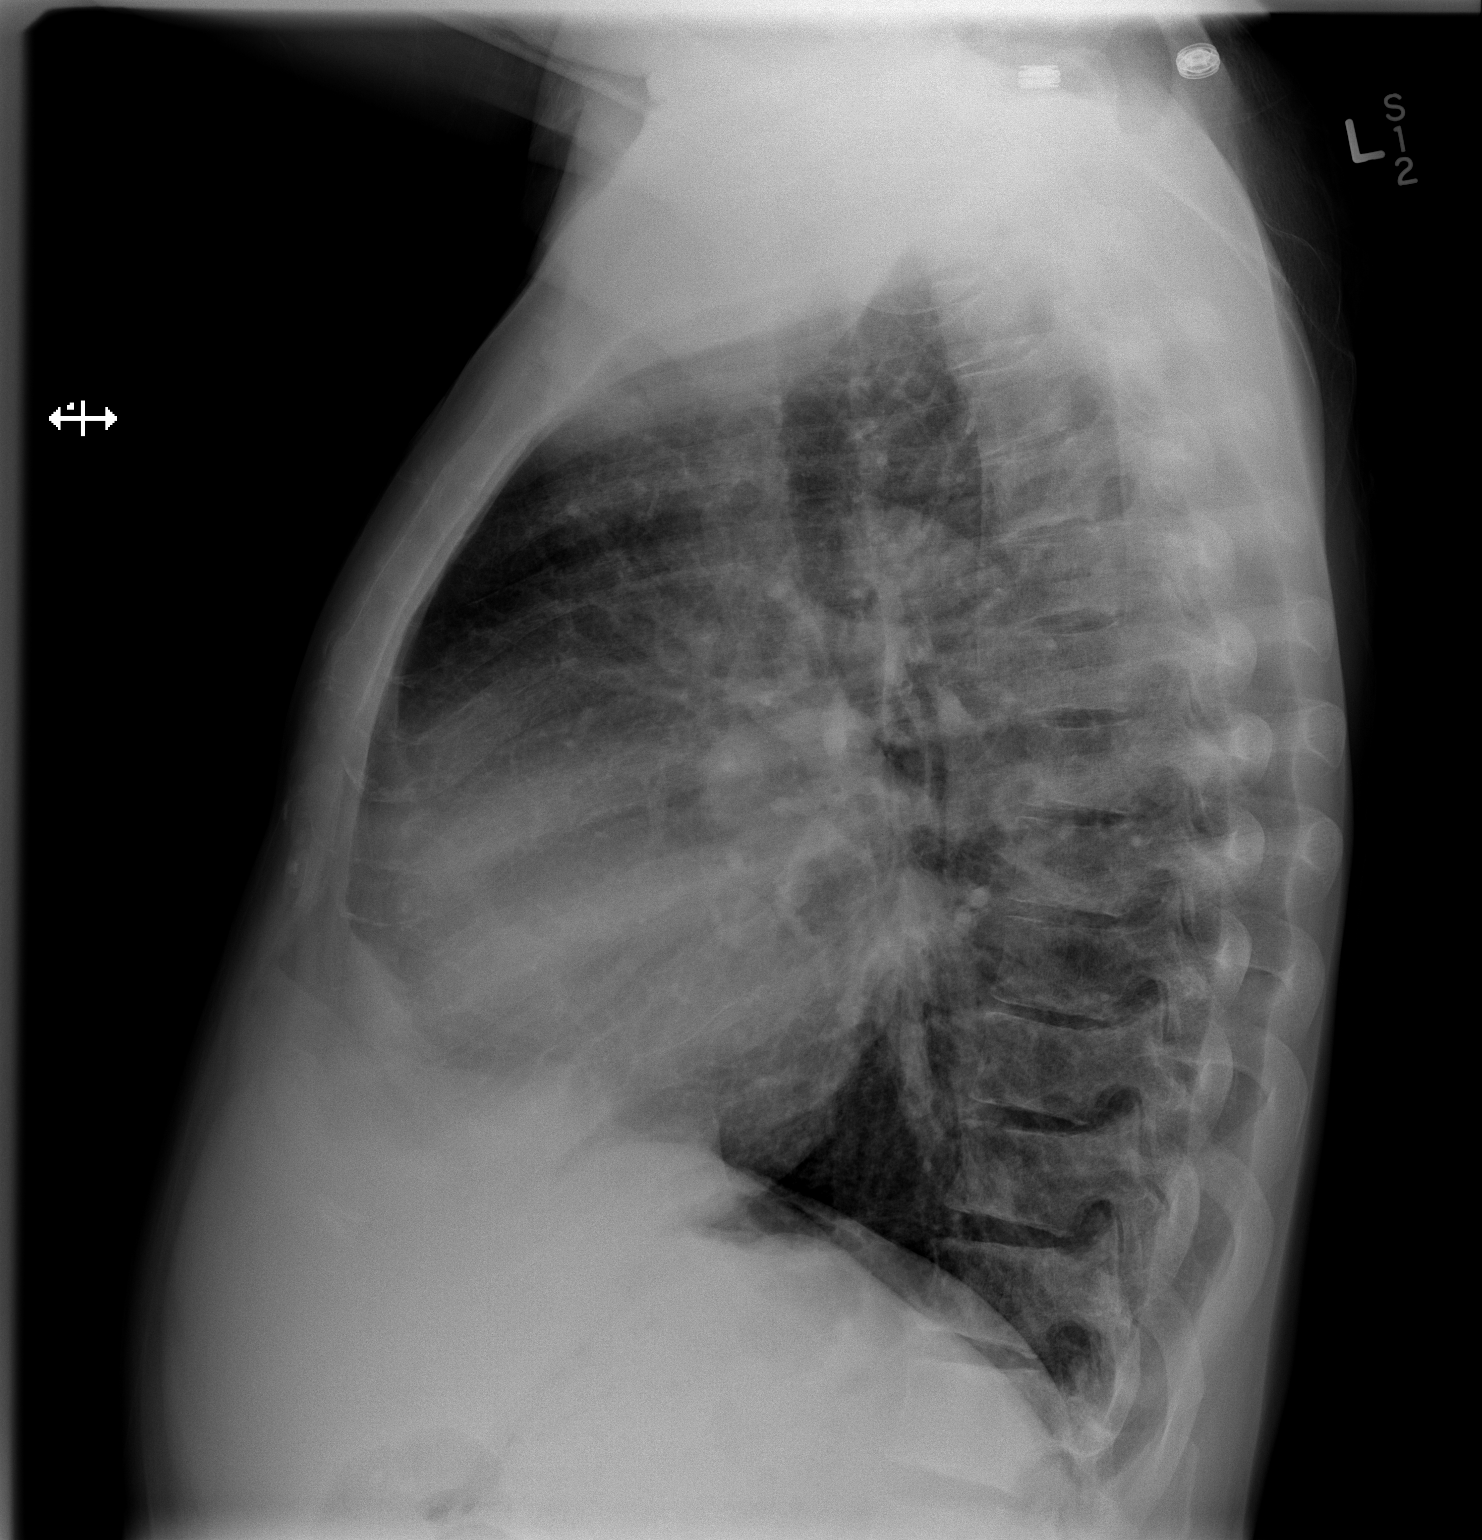

[2 of 2 positions shown; findings below may reference images not displayed]

FINDINGS: Mild CHF is present. There is cardiomegaly, pulmonary vascular
congestion and interstitial pulmonary edema. Narrow a be tiny
effusions with blunting of the costophrenic angles on the lateral
view. No focal consolidation.
IMPRESSION: Mild CHF with interstitial pulmonary edema.

## 2015-06-26 ENCOUNTER — Other Ambulatory Visit: Payer: Self-pay | Admitting: Family Medicine

## 2015-06-28 ENCOUNTER — Other Ambulatory Visit (HOSPITAL_COMMUNITY): Payer: Self-pay | Admitting: Family Medicine

## 2015-06-28 MED ORDER — ALLOPURINOL 100 MG PO TABS: 100.0000 mg | ORAL_TABLET | Freq: Every day | ORAL | Status: DC

## 2015-07-01 ENCOUNTER — Other Ambulatory Visit (HOSPITAL_COMMUNITY): Payer: Self-pay | Admitting: Internal Medicine

## 2015-07-20 DIAGNOSIS — N189 Chronic kidney disease, unspecified: Secondary | ICD-10-CM | POA: Diagnosis not present

## 2015-07-20 DIAGNOSIS — I5020 Unspecified systolic (congestive) heart failure: Secondary | ICD-10-CM | POA: Diagnosis not present

## 2015-07-20 DIAGNOSIS — I770 Arteriovenous fistula, acquired: Secondary | ICD-10-CM | POA: Diagnosis not present

## 2015-07-20 DIAGNOSIS — I129 Hypertensive chronic kidney disease with stage 1 through stage 4 chronic kidney disease, or unspecified chronic kidney disease: Secondary | ICD-10-CM | POA: Diagnosis not present

## 2015-07-20 DIAGNOSIS — D571 Sickle-cell disease without crisis: Secondary | ICD-10-CM | POA: Diagnosis not present

## 2015-07-20 DIAGNOSIS — D649 Anemia, unspecified: Secondary | ICD-10-CM | POA: Diagnosis not present

## 2015-07-20 DIAGNOSIS — M109 Gout, unspecified: Secondary | ICD-10-CM | POA: Diagnosis not present

## 2015-07-20 DIAGNOSIS — N2581 Secondary hyperparathyroidism of renal origin: Secondary | ICD-10-CM | POA: Diagnosis not present

## 2015-07-28 ENCOUNTER — Other Ambulatory Visit: Payer: Self-pay | Admitting: Family Medicine

## 2015-07-28 ENCOUNTER — Other Ambulatory Visit (HOSPITAL_COMMUNITY): Payer: Self-pay | Admitting: Internal Medicine

## 2015-08-11 ENCOUNTER — Ambulatory Visit (INDEPENDENT_AMBULATORY_CARE_PROVIDER_SITE_OTHER): Payer: PPO | Admitting: Family Medicine

## 2015-08-11 ENCOUNTER — Encounter: Payer: Self-pay | Admitting: Family Medicine

## 2015-08-11 VITALS — BP 133/71 | HR 82 | Ht 71.0 in | Wt 147.0 lb

## 2015-08-11 DIAGNOSIS — Z1211 Encounter for screening for malignant neoplasm of colon: Secondary | ICD-10-CM | POA: Diagnosis not present

## 2015-08-11 DIAGNOSIS — M25561 Pain in right knee: Secondary | ICD-10-CM

## 2015-08-11 DIAGNOSIS — M25562 Pain in left knee: Secondary | ICD-10-CM | POA: Diagnosis not present

## 2015-08-11 DIAGNOSIS — D571 Sickle-cell disease without crisis: Secondary | ICD-10-CM | POA: Diagnosis not present

## 2015-08-11 LAB — COMPLETE METABOLIC PANEL WITH GFR
ALT: 16 U/L (ref 9–46)
AST: 32 U/L (ref 10–35)
Albumin: 4.2 g/dL (ref 3.6–5.1)
Alkaline Phosphatase: 74 U/L (ref 40–115)
BUN: 50 mg/dL — ABNORMAL HIGH (ref 7–25)
CO2: 18 mmol/L — ABNORMAL LOW (ref 20–31)
Calcium: 8.6 mg/dL (ref 8.6–10.3)
Chloride: 105 mmol/L (ref 98–110)
Creat: 2.9 mg/dL — ABNORMAL HIGH (ref 0.70–1.33)
GFR, Est African American: 27 mL/min — ABNORMAL LOW (ref >=60)
GFR, Est Non African American: 23 mL/min — ABNORMAL LOW (ref >=60)
Glucose, Bld: 87 mg/dL (ref 65–99)
Potassium: 5 mmol/L (ref 3.5–5.3)
Sodium: 134 mmol/L — ABNORMAL LOW (ref 135–146)
Total Bilirubin: 2.5 mg/dL — ABNORMAL HIGH (ref 0.2–1.2)
Total Protein: 7.3 g/dL (ref 6.1–8.1)

## 2015-08-11 MED ORDER — OXYCODONE HCL 5 MG PO TABS: 5.0000 mg | ORAL_TABLET | ORAL | Status: DC | PRN

## 2015-08-11 MED ORDER — FOLIC ACID 1 MG PO TABS: ORAL_TABLET | ORAL | Status: DC

## 2015-08-11 MED ORDER — ALLOPURINOL 100 MG PO TABS: 100.0000 mg | ORAL_TABLET | Freq: Every day | ORAL | Status: DC

## 2015-08-11 NOTE — Progress Notes (Signed)
Patient ID: Patrick [name], male   DOB: [birth year], 60 y.o.   MRN: [id number]   Patrick [name], is a 60 y.o. male  [id number]  [id number]  DOB - 13-May-1956  CC:  Chief Complaint  Patient presents with  . Follow-up    medication management       HPI: Patrick [name] is a 60 y.o. male here to for follow-up SCD. He reports rarely having [place] pain. He has not had a refill on his pain medication since !07/2013.  A couple of days ago, he developed pain behind his right knee and down his right leg. He reports it is similar to the pain he experiences with SCD, except his [place] pain is usually in his low back and both legs. He is taking his folic acid and Hydrea. He is legally blind as a consequence of his SSD. He has had to recent transfusions. He has CKD but there is no anticipation of dialysis in the near future. His Has hypertension and CHF and these are managed by cardiology.He also has a history of gout and is on allopurinol.   Health Maintenance. Is UTD on flu shot. Has been screened for HIV and Hep C. He has also had both pneumonia vaccines and has been screened for prostate cancer. He has not been screened for colon cancer.  No Known Allergies Past Medical History  Diagnosis Date  . Swelling of extremity, right   . Swelling abdomen   . Swelling of extremity, left   . Blood transfusion     "I've had a bunch of them"  . Blood dyscrasia   . Legally blind   . Sickle cell disease, type SS ([place])   . [id number])     "probably weekly" (01/13/2014)  . Gout   . Chronic kidney disease (CKD), stage III (moderate)   . CHF (congestive heart failure) ([place])     followed by hf clinic   Current Outpatient Prescriptions on File Prior to Visit  Medication Sig Dispense Refill  . aspirin EC 81 MG tablet Take 81 mg by mouth daily.    . calcitRIOL (ROCALTROL) 0.25 MCG capsule Take 0.25 mcg by mouth daily.     . carvedilol (COREG) 25 MG tablet TAKE 1 TABLET (25 MG TOTAL) BY MOUTH 2  (TWO) TIMES DAILY WITH A MEAL. 60 tablet 3  . furosemide (LASIX) 40 MG tablet Take 1.5 tablets (60 mg total) by mouth daily. 45 tablet 6  . hydrALAZINE (APRESOLINE) 50 MG tablet TAKE 1.5 TABLETS (75 MG TOTAL) BY MOUTH 3 (THREE) TIMES DAILY. 135 tablet 3  . hydroxyurea (HYDREA) 500 MG capsule TAKE ONE CAPSULE BY MOUTH DAILY . MAY TAKE WITH FOOD TO MINIMIZE GI SIDE EFFECTS 30 capsule 3  . isosorbide mononitrate (IMDUR) 60 MG 24 hr tablet TAKE 1 TABLET BY MOUTH EVERY DAY 30 tablet 3  . metolazone (ZAROXOLYN) 2.5 MG tablet TAKE 1 TABLET BY MOUTH AS NEEDED 8 tablet 5  . polyethylene glycol powder (GLYCOLAX/MIRALAX) powder Take 17 g by mouth daily. 3350 g 1   No current facility-administered medications on file prior to visit.   Family History  Problem Relation Age of Onset  . Alcohol abuse Mother   . Liver disease Mother   . Cirrhosis Mother   . Heart attack Mother    Social History   Social History  . Marital Status: Divorced    Spouse Name: N/A  . Number of Children: N/A  . Years of Education: N/A   Occupational History  .  Not on file.   Social History Main Topics  . Smoking status: Current Every Day Smoker -- 0.25 packs/day for 41 years    Types: Cigarettes  . Smokeless tobacco: Never Used  . Alcohol Use: 1.8 oz/week    1 Glasses of wine, 2 Cans of beer per week     Comment: occasional  . Drug Use: No  . Sexual Activity: Not Currently   Other Topics Concern  . Not on file   Social History Narrative    Review of Systems: Constitutional: Negative for fever, chills, appetite change, weight loss,  Fatigue. Skin: Negative for rashes or lesions of concern. HENT: Negative for ear pain, ear discharge.nose bleeds Eyes: Negative for pain, discharge, redness. Legally blind Neck: Negative for pain, stiffness Respiratory: Negative for cough, shortness of breath,   Cardiovascular: Negative for chest pain, palpitations and leg swelling. Gastrointestinal: Negative for abdominal  pain, nausea, vomiting, diarrhea, constipations Genitourinary: Negative for dysuria, urgency, frequency, hematuria,  Musculoskeletal: Negative for back pain,  joint  swelling, and gait problem.Negative for weakness.Positive for right posterior knee and leg pain. Neurological: Negative for dizziness, tremors, seizures, syncope,   light-headedness, numbness and headaches.  Hematological: Negative for easy bruising or bleeding Psychiatric/Behavioral: Negative for anxiety. Admits to some depression but denies sucicidal ideation and need for a referral and/or medication   Objective:   Filed Vitals:   08/11/15 1326  BP: 133/71  Pulse: 82    Physical Exam: Constitutional: Patient appears well-developed and well-nourished. No distress. HENT: Normocephalic, atraumatic, External right and left ear normal. Oropharynx is clear and moist.  Eyes: Conjunctivae and EOM are normal. PERRLA, no scleral icterus. Neck: Normal ROM. Neck supple. No lymphadenopathy, No thyromegaly. CVS: RRR, S1/S2 +, no murmurs, no gallops, no rubs Pulmonary: Effort and breath sounds normal, no stridor, rhonchi, wheezes, rales.  Abdominal: Soft. Normoactive BS,, no distension, tenderness, rebound or guarding.  Musculoskeletal: Normal range of motion. No edema and no tenderness. Walks with a limp Neuro: Alert.Normal muscle tone coordination. Non-focal Skin: Skin is warm and dry. No rash noted. Not diaphoretic. No erythema. No pallor. Psychiatric: Normal mood and affect. Behavior, judgment, thought content normal.  Lab Results  Component Value Date   WBC 6.1 02/17/2015   HGB 7.8* 02/17/2015   HCT 22.6* 02/17/2015   MCV 113.6* 02/17/2015   PLT 158 02/17/2015   Lab Results  Component Value Date   CREATININE 2.32* 02/17/2015   BUN 37* 02/17/2015   NA 135 02/17/2015   K 5.3 02/17/2015   CL 108 02/17/2015   CO2 17* 02/17/2015    Lab Results  Component Value Date   HGBA1C <4.0 06/07/2011   Lipid Panel      Component Value Date/Time   CHOL 114* 02/17/2015 1105   TRIG 76 02/17/2015 1105   HDL 31* 02/17/2015 1105   CHOLHDL 3.7 02/17/2015 1105   VLDL 15 02/17/2015 1105   LDLCALC 68 02/17/2015 1105       Assessment and plan:   1. Hb-SS disease without crisis ([place])  - oxyCODONE (ROXICODONE) 5 MG immediate release tablet; Take 1 tablet (5 mg total) by mouth every 4 (four) hours as needed for moderate pain or severe pain.  Dispense: 60 tablet; Refill: 0 - COMPLETE METABOLIC PANEL WITH GFR - CBC with Differential - Reticulocytes  He has not had a refill on his pain medication for SCD since December 2015 For that reason we are going to temporarily discontinue his Hydrea and see how he does for the  next month. Will have him follow-up with Dr. Doreene Burke in one month.   3. Special screening for malignant neoplasms, colon  - POC Hemoccult Bld/Stl (3-Cd Home Screen); Future   Return in about 1 month (around 09/08/2015).  Appointment with Dr. Doreene Burke.   The patient was given clear instructions to go to ER or return to medical center if symptoms don't improve, worsen or new problems develop. The patient verbalized understanding.    Micheline Chapman FNP  08/11/2015, 2:18 PM

## 2015-08-12 LAB — CBC WITH DIFFERENTIAL/PLATELET
Basophils Absolute: 0.1 10*3/uL (ref 0.0–0.1)
Basophils Relative: 1 % (ref 0–1)
Eosinophils Absolute: 0.2 10*3/uL (ref 0.0–0.7)
Eosinophils Relative: 2 % (ref 0–5)
HCT: 25.3 % — ABNORMAL LOW (ref 39.0–52.0)
Hemoglobin: 8.9 g/dL — ABNORMAL LOW (ref 13.0–17.0)
Lymphocytes Relative: 18 % (ref 12–46)
Lymphs Abs: 1.6 10*3/uL (ref 0.7–4.0)
MCH: 38.4 pg — ABNORMAL HIGH (ref 26.0–34.0)
MCHC: 35.2 g/dL (ref 30.0–36.0)
MCV: 109.1 fL — ABNORMAL HIGH (ref 78.0–100.0)
MPV: 11.2 fL (ref 8.6–12.4)
Monocytes Absolute: 1.6 10*3/uL — ABNORMAL HIGH (ref 0.1–1.0)
Monocytes Relative: 18 % — ABNORMAL HIGH (ref 3–12)
Neutro Abs: 5.3 10*3/uL (ref 1.7–7.7)
Neutrophils Relative %: 61 % (ref 43–77)
Platelets: 147 10*3/uL — ABNORMAL LOW (ref 150–400)
RBC: 2.32 MIL/uL — ABNORMAL LOW (ref 4.22–5.81)
RDW: 18.5 % — ABNORMAL HIGH (ref 11.5–15.5)
WBC: 8.7 10*3/uL (ref 4.0–10.5)

## 2015-08-12 LAB — RETICULOCYTES
ABS Retic: 187.9 10*3/uL — ABNORMAL HIGH (ref 19.0–186.0)
RBC.: 2.32 MIL/uL — ABNORMAL LOW (ref 4.22–5.81)
Retic Ct Pct: 8.1 % — ABNORMAL HIGH (ref 0.4–2.3)

## 2015-08-28 ENCOUNTER — Other Ambulatory Visit (HOSPITAL_COMMUNITY): Payer: Self-pay | Admitting: Internal Medicine

## 2015-09-20 ENCOUNTER — Ambulatory Visit (INDEPENDENT_AMBULATORY_CARE_PROVIDER_SITE_OTHER): Payer: PPO | Admitting: Family Medicine

## 2015-09-20 ENCOUNTER — Encounter: Payer: Self-pay | Admitting: Family Medicine

## 2015-09-20 VITALS — BP 122/63 | HR 91 | Temp 98.1°F | Resp 18 | Ht 71.0 in | Wt 147.0 lb

## 2015-09-20 DIAGNOSIS — D571 Sickle-cell disease without crisis: Secondary | ICD-10-CM | POA: Diagnosis not present

## 2015-09-20 DIAGNOSIS — J069 Acute upper respiratory infection, unspecified: Secondary | ICD-10-CM | POA: Diagnosis not present

## 2015-09-20 DIAGNOSIS — F17200 Nicotine dependence, unspecified, uncomplicated: Secondary | ICD-10-CM

## 2015-09-20 DIAGNOSIS — R6883 Chills (without fever): Secondary | ICD-10-CM | POA: Diagnosis not present

## 2015-09-20 DIAGNOSIS — R05 Cough: Secondary | ICD-10-CM | POA: Diagnosis not present

## 2015-09-20 DIAGNOSIS — R058 Other specified cough: Secondary | ICD-10-CM

## 2015-09-20 MED ORDER — OXYCODONE HCL 5 MG PO TABS: 5.0000 mg | ORAL_TABLET | ORAL | Status: DC | PRN

## 2015-09-20 MED ORDER — AZITHROMYCIN 250 MG PO TABS: ORAL_TABLET | ORAL | Status: DC

## 2015-09-20 MED ORDER — BENZONATATE 100 MG PO CAPS: 100.0000 mg | ORAL_CAPSULE | Freq: Three times a day (TID) | ORAL | Status: DC | PRN

## 2015-09-20 NOTE — Patient Instructions (Signed)
Upper Respiratory Infection, Adult Most upper respiratory infections (URIs) are a viral infection of the air passages leading to the lungs. A URI affects the nose, throat, and upper air passages. The most common type of URI is nasopharyngitis and is typically referred to as "the common cold." URIs run their course and usually go away on their own. Most of the time, a URI does not require medical attention, but sometimes a bacterial infection in the upper airways can follow a viral infection. This is called a secondary infection. Sinus and middle ear infections are common types of secondary upper respiratory infections. Bacterial pneumonia can also complicate a URI. A URI can worsen asthma and chronic obstructive pulmonary disease (COPD). Sometimes, these complications can require emergency medical care and may be life threatening.  CAUSES Almost all URIs are caused by viruses. A virus is a type of germ and can spread from one person to another.  RISKS FACTORS You may be at risk for a URI if:   You smoke.   You have chronic heart or lung disease.  You have a weakened defense (immune) system.   You are very young or very old.   You have nasal allergies or asthma.  You work in crowded or poorly ventilated areas.  You work in health care facilities or schools. SIGNS AND SYMPTOMS  Symptoms typically develop 2-3 days after you come in contact with a cold virus. Most viral URIs last 7-10 days. However, viral URIs from the influenza virus (flu virus) can last 14-18 days and are typically more severe. Symptoms may include:   Runny or stuffy (congested) nose.   Sneezing.   Cough.   Sore throat.   Headache.   Fatigue.   Fever.   Loss of appetite.   Pain in your forehead, behind your eyes, and over your cheekbones (sinus pain).  Muscle aches.  DIAGNOSIS  Your health care provider may diagnose a URI by:  Physical exam.  Tests to check that your symptoms are not due to  another condition such as:  Strep throat.  Sinusitis.  Pneumonia.  Asthma. TREATMENT  A URI goes away on its own with time. It cannot be cured with medicines, but medicines may be prescribed or recommended to relieve symptoms. Medicines may help:  Reduce your fever.  Reduce your cough.  Relieve nasal congestion. HOME CARE INSTRUCTIONS   Take medicines only as directed by your health care provider.   Gargle warm saltwater or take cough drops to comfort your throat as directed by your health care provider.  Use a warm mist humidifier or inhale steam from a shower to increase air moisture. This may make it easier to breathe.  Drink enough fluid to keep your urine clear or pale yellow.   Eat soups and other clear broths and maintain good nutrition.   Rest as needed.   Return to work when your temperature has returned to normal or as your health care provider advises. You may need to stay home longer to avoid infecting others. You can also use a face mask and careful hand washing to prevent spread of the virus.  Increase the usage of your inhaler if you have asthma.   Do not use any tobacco products, including cigarettes, chewing tobacco, or electronic cigarettes. If you need help quitting, ask your health care provider. PREVENTION  The best way to protect yourself from getting a cold is to practice good hygiene.   Avoid oral or hand contact with people with cold   symptoms.   Wash your hands often if contact occurs.  There is no clear evidence that vitamin C, vitamin E, echinacea, or exercise reduces the chance of developing a cold. However, it is always recommended to get plenty of rest, exercise, and practice good nutrition.  SEEK MEDICAL CARE IF:   You are getting worse rather than better.   Your symptoms are not controlled by medicine.   You have chills.  You have worsening shortness of breath.  You have brown or red mucus.  You have yellow or brown nasal  discharge.  You have pain in your face, especially when you bend forward.  You have a fever.  You have swollen neck glands.  You have pain while swallowing.  You have white areas in the back of your throat. SEEK IMMEDIATE MEDICAL CARE IF:   You have severe or persistent:  Headache.  Ear pain.  Sinus pain.  Chest pain.  You have chronic lung disease and any of the following:  Wheezing.  Prolonged cough.  Coughing up blood.  A change in your usual mucus.  You have a stiff neck.  You have changes in your:  Vision.  Hearing.  Thinking.  Mood. MAKE SURE YOU:   Understand these instructions.  Will watch your condition.  Will get help right away if you are not doing well or get worse.   This information is not intended to replace advice given to you by your health care provider. Make sure you discuss any questions you have with your health care provider.   Document Released: 11/28/2000 Document Revised: 10/19/2014 Document Reviewed: 09/09/2013 Elsevier Interactive Patient Education 2016 [employer] Sickle Cell Anemia, Adult Sickle cell anemia is a condition in which red blood cells have an abnormal "sickle" shape. This abnormal shape shortens the cells' life span, which results in a lower than normal concentration of red blood cells in the blood. The sickle shape also causes the cells to clump together and block free blood flow through the blood vessels. As a result, the tissues and organs of the body do not receive enough oxygen. Sickle cell anemia causes organ damage and pain and increases the risk of infection. CAUSES  Sickle cell anemia is a genetic disorder. Those who receive two copies of the gene have the condition, and those who receive one copy have the trait. RISK FACTORS The sickle cell gene is most common in people whose families originated in [place]. Other areas of the globe where sickle cell trait occurs include the [place], [place] and  [place], and the [place].  SIGNS AND SYMPTOMS  Pain, especially in the extremities, back, chest, or abdomen (common). The pain may start suddenly or may develop following an illness, especially if there is dehydration. Pain can also occur due to overexertion or exposure to extreme temperature changes.  Frequent severe bacterial infections, especially certain types of pneumonia and meningitis.  Pain and swelling in the hands and feet.  Decreased activity.   Loss of appetite.   Change in behavior.  Headaches.  Seizures.  Shortness of breath or difficulty breathing.  Vision changes.  Skin ulcers. Those with the trait may not have symptoms or they may have mild symptoms.  DIAGNOSIS  Sickle cell anemia is diagnosed with blood tests that demonstrate the genetic trait. It is often diagnosed during the newborn period, due to mandatory testing nationwide. A variety of blood tests, X-rays, CT scans, MRI scans, ultrasounds, and lung function tests may also be  done to monitor the condition. TREATMENT  Sickle cell anemia may be treated with:  Medicines. You may be given pain medicines, antibiotic medicines (to treat and prevent infections) or medicines to increase the production of certain types of hemoglobin.  Fluids.  Oxygen.  Blood transfusions. HOME CARE INSTRUCTIONS   Drink enough fluid to keep your urine clear or pale yellow. Increase your fluid intake in hot weather and during exercise.  Do not smoke. Smoking lowers oxygen levels in the blood.   Only take over-the-counter or prescription medicines for pain, fever, or discomfort as directed by your health care provider.  Take antibiotics as directed by your health care provider. Make sure you finish them it even if you start to feel better.   Take supplements as directed by your health care provider.   Consider wearing a medical alert bracelet. This tells anyone caring for you in an emergency  of your condition.   When traveling, keep your medical information, health care provider's names, and the medicines you take with you at all times.   If you develop a fever, do not take medicines to reduce the fever right away. This could cover up a problem that is developing. Notify your health care provider.  Keep all follow-up appointments with your health care provider. Sickle cell anemia requires regular medical care. SEEK MEDICAL CARE IF: You have a fever. SEEK IMMEDIATE MEDICAL CARE IF:   You feel dizzy or faint.   You have new abdominal pain, especially on the left side near the stomach area.   You develop a persistent, often uncomfortable and painful penile erection (priapism). If this is not treated immediately it will lead to impotence.   You have numbness your arms or legs or you have a hard time moving them.   You have a hard time with speech.   You have a fever or persistent symptoms for more than 2-3 days.   You have a fever and your symptoms suddenly get worse.   You have signs or symptoms of infection. These include:   Chills.   Abnormal tiredness (lethargy).   Irritability.   Poor eating.   Vomiting.   You develop pain that is not helped with medicine.   You develop shortness of breath.  You have pain in your chest.   You are coughing up pus-like or bloody sputum.   You develop a stiff neck.  Your feet or hands swell or have pain.  Your abdomen appears bloated.  You develop joint pain. MAKE SURE YOU:  Understand these instructions.   This information is not intended to replace advice given to you by your health care provider. Make sure you discuss any questions you have with your health care provider.   Document Released: 09/12/2005 Document Revised: 06/25/2014 Document Reviewed: 01/14/2013 Elsevier Interactive Patient Education [employer].

## 2015-09-20 NOTE — Progress Notes (Signed)
Patient is here for 1 month FU  Patient complains returning from [place] on Sat 09/10/15 from a 5 day cruise and on 09/13/15 patient began experiencing a sore throat, chills, body aches in extremities. Patient used lozenges with minimal relief.  Patient has taken medications today and patient has eaten today.

## 2015-09-20 NOTE — Progress Notes (Signed)
Subjective:    Patient ID: Patrick [name], male    DOB: 1956/06/18, 60 y.o.   MRN: [id number] HPI  Mr. Halbert Tellman, a 60 year old patient with a history of sickle cell anemia, HbSC presents complaining of upper respiratory symptoms over the past 10 days. Mr. Lighter states that he went on a cruise to the [place] 2 weeks ago and returned with upper respiratory symptoms. He reports symptoms including intermittent fever and chills, congestion, coughing, headaches, joint pain, rhinorrhea and sore throat. He currently denies abdominal pain, diarrhea, dysuria, ear pain, vomiting, or wheezing. He has attempted to increase fluids and decongestant for the symptoms. The treatment provided no relief.   Past Medical History  Diagnosis Date  . Swelling of extremity, right   . Swelling abdomen   . Swelling of extremity, left   . Blood transfusion     "I've had a bunch of them"  . Blood dyscrasia   . Legally blind   . Sickle cell disease, type SS ([place])   . [id number])     "probably weekly" (01/13/2014)  . Gout   . Chronic kidney disease (CKD), stage III (moderate)   . CHF (congestive heart failure) ([place])     followed by h[facility] clinic   Immunization History  Administered Date(s) Administered  . Influenza,inj,Quad PF,36+ Mos 03/17/2013, 03/08/2014  . Pneumococcal Conjugate-13 12/02/2014  . Pneumococcal Polysaccharide-23 02/10/2013  . Tdap 09/24/2013  No Known Allergies   Review of Systems  Constitutional: Positive for fatigue. Negative for fever and unexpected weight change.  HENT: Positive for congestion, rhinorrhea and sore throat. Negative for ear pain.   Eyes: Positive for visual disturbance (legally blind).  Respiratory: Positive for cough. Negative for wheezing.   Gastrointestinal: Negative for nausea, vomiting, abdominal pain and diarrhea.  Endocrine: Negative.   Genitourinary: Negative for dysuria.  Musculoskeletal: Positive for joint pain. Negative for neck  pain.  Skin: Negative for rash.  Allergic/Immunologic: Negative.   Neurological: Positive for headaches.  Psychiatric/Behavioral: Negative.        Objective:   Physical Exam  Constitutional: He is oriented to person, place, and time. He appears well-developed and well-nourished.  HENT:  Head: Normocephalic.  Right Ear: Tympanic membrane is erythematous.  Left Ear: Tympanic membrane is erythematous.  Nose: Mucosal edema and rhinorrhea present. Right sinus exhibits no maxillary sinus tenderness and no frontal sinus tenderness. Left sinus exhibits no maxillary sinus tenderness and no frontal sinus tenderness.  Mouth/Throat: Mucous membranes are normal. Posterior oropharyngeal edema present.  Eyes: Conjunctivae and EOM are normal. Pupils are equal, round, and reactive to light.  Neck: Normal range of motion. Neck supple.  Cardiovascular: Normal rate, regular rhythm, normal heart sounds and intact distal pulses.   Pulmonary/Chest: Effort normal and breath sounds normal.  Abdominal: Soft. Bowel sounds are normal.  Musculoskeletal: Normal range of motion.  Neurological: He is alert and oriented to person, place, and time. He has normal reflexes.  Skin: Skin is warm and dry.  Psychiatric: He has a normal mood and affect. His behavior is normal. Judgment and thought content normal.       BP 122/63 mmHg  Pulse 91  Temp(Src) 98.1 F (36.7 C) (Oral)  Resp 18  Ht 5\' 11"  (1.803 m)  Wt 147 lb (66.679 kg)  BMI 20.51 kg/m2  SpO2 95% Assessment & Plan:   1. Acute upper respiratory infection Recommend increasing rest, handwashing, and fluid intake.   - azithromycin (ZITHROMAX) 250 MG tablet; Take 500 mg;  Days 2-5 250 mg  Dispense: 6 tablet; Refill: 0 - benzonatate (TESSALON) 100 MG capsule; Take 1 capsule (100 mg total) by mouth 3 (three) times daily as needed for cough.  Dispense: 30 capsule; Refill: 0  2. Chills Recommend that he check temperature at home. Tylenol 500 mg every 6 hours  as needed for fever and chills.   3. Cough with sputum Increase fluid intake.  - benzonatate (TESSALON) 100 MG capsule; Take 1 capsule (100 mg total) by mouth 3 (three) times daily as needed for cough.  Dispense: 30 capsule; Refill: 0  4. Hb-SS disease without crisis [facility]) Patient discontinued Hydrea greater than 1 month ago. He has infrequent sickle cell crisis. Will not restart Hydrea.   Continue folic acid 1 mg daily to prevent aplastic bone marrow crises.    Mr. Wolak infrequently uses opiate medications for pain control. We discussed that pt is to receive his Schedule II prescriptions only from [place]. Pt is also aware that the prescription history is available to [place] online through the [facility] CSRS. Controlled substance agreement signed previously. We reminded Mr. Lundrigan that all patients receiving Schedule II narcotics must be seen for follow within one month of prescription being requested. . According to the [place] [facility] program, we have reviewed details related to analgesia, adverse effects, aberrant behaviors. Reviewed [place] Substance Reporting system prior to prescribing opiate medications, no inconsistencies noted.   - oxyCODONE (ROXICODONE) 5 MG immediate release tablet; Take 1 tablet (5 mg total) by mouth every 4 (four) hours as needed for moderate pain or severe pain.  Dispense: 60 tablet; Refill: 0  5. Tobacco dependence Smoking cessation instruction/counseling given:  counseled patient on the dangers of tobacco use, advised patient to stop smoking, and reviewed strategies to maximize success    RTC: 1 month for sickle cell anemia    Login Muckleroy M, FNP

## 2015-09-23 ENCOUNTER — Other Ambulatory Visit: Payer: Self-pay | Admitting: Internal Medicine

## 2015-10-25 ENCOUNTER — Encounter: Payer: Self-pay | Admitting: Internal Medicine

## 2015-10-25 ENCOUNTER — Ambulatory Visit (INDEPENDENT_AMBULATORY_CARE_PROVIDER_SITE_OTHER): Payer: PPO | Admitting: Internal Medicine

## 2015-10-25 VITALS — BP 126/71 | HR 74 | Temp 98.2°F | Resp 18 | Ht 70.0 in | Wt 146.0 lb

## 2015-10-25 DIAGNOSIS — J069 Acute upper respiratory infection, unspecified: Secondary | ICD-10-CM | POA: Diagnosis not present

## 2015-10-25 DIAGNOSIS — R05 Cough: Secondary | ICD-10-CM

## 2015-10-25 DIAGNOSIS — D571 Sickle-cell disease without crisis: Secondary | ICD-10-CM | POA: Diagnosis not present

## 2015-10-25 DIAGNOSIS — R058 Other specified cough: Secondary | ICD-10-CM | POA: Insufficient documentation

## 2015-10-25 DIAGNOSIS — Z720 Tobacco use: Secondary | ICD-10-CM | POA: Diagnosis not present

## 2015-10-25 DIAGNOSIS — R059 Cough, unspecified: Secondary | ICD-10-CM | POA: Insufficient documentation

## 2015-10-25 MED ORDER — OXYCODONE HCL 5 MG PO TABS: 5.0000 mg | ORAL_TABLET | ORAL | Status: DC | PRN

## 2015-10-25 MED ORDER — BENZONATATE 100 MG PO CAPS: 100.0000 mg | ORAL_CAPSULE | Freq: Three times a day (TID) | ORAL | Status: DC | PRN

## 2015-10-25 MED ORDER — DOXYCYCLINE HYCLATE 100 MG PO TABS: 100.0000 mg | ORAL_TABLET | Freq: Two times a day (BID) | ORAL | Status: AC

## 2015-10-25 MED ORDER — GUAIFENESIN-DM 100-10 MG/5ML PO SYRP: 5.0000 mL | ORAL_SOLUTION | ORAL | Status: DC | PRN

## 2015-10-25 NOTE — Progress Notes (Signed)
Patient is here for FU  Patient denies pain at this time.  Patient has not taken any medications and patient has not eaten.

## 2015-10-25 NOTE — Patient Instructions (Signed)
Sickle Cell Anemia, Adult Sickle cell anemia is a condition in which red blood cells have an abnormal "sickle" shape. This abnormal shape shortens the cells' life span, which results in a lower than normal concentration of red blood cells in the blood. The sickle shape also causes the cells to clump together and block free blood flow through the blood vessels. As a result, the tissues and organs of the body do not receive enough oxygen. Sickle cell anemia causes organ damage and pain and increases the risk of infection. CAUSES  Sickle cell anemia is a genetic disorder. Those who receive two copies of the gene have the condition, and those who receive one copy have the trait. RISK FACTORS The sickle cell gene is most common in people whose families originated in [place]. Other areas of the globe where sickle cell trait occurs include the Mediterranean, South and [place], the [place], and the [place].  SIGNS AND SYMPTOMS  Pain, especially in the extremities, back, chest, or abdomen (common). The pain may start suddenly or may develop following an illness, especially if there is dehydration. Pain can also occur due to overexertion or exposure to extreme temperature changes.  Frequent severe bacterial infections, especially certain types of pneumonia and meningitis.  Pain and swelling in the hands and feet.  Decreased activity.   Loss of appetite.   Change in behavior.  Headaches.  Seizures.  Shortness of breath or difficulty breathing.  Vision changes.  Skin ulcers. Those with the trait may not have symptoms or they may have mild symptoms.  DIAGNOSIS  Sickle cell anemia is diagnosed with blood tests that demonstrate the genetic trait. It is often diagnosed during the newborn period, due to mandatory testing nationwide. A variety of blood tests, X-rays, CT scans, MRI scans, ultrasounds, and lung function tests may also be done to monitor the condition. TREATMENT  Sickle  cell anemia may be treated with:  Medicines. You may be given pain medicines, antibiotic medicines (to treat and prevent infections) or medicines to increase the production of certain types of hemoglobin.  Fluids.  Oxygen.  Blood transfusions. HOME CARE INSTRUCTIONS   Drink enough fluid to keep your urine clear or pale yellow. Increase your fluid intake in hot weather and during exercise.  Do not smoke. Smoking lowers oxygen levels in the blood.   Only take over-the-counter or prescription medicines for pain, fever, or discomfort as directed by your health care provider.  Take antibiotics as directed by your health care provider. Make sure you finish them it even if you start to feel better.   Take supplements as directed by your health care provider.   Consider wearing a medical alert bracelet. This tells anyone caring for you in an emergency of your condition.   When traveling, keep your medical information, health care provider's names, and the medicines you take with you at all times.   If you develop a fever, do not take medicines to reduce the fever right away. This could cover up a problem that is developing. Notify your health care provider.  Keep all follow-up appointments with your health care provider. Sickle cell anemia requires regular medical care. SEEK MEDICAL CARE IF: You have a fever. SEEK IMMEDIATE MEDICAL CARE IF:   You feel dizzy or faint.   You have new abdominal pain, especially on the left side near the stomach area.   You develop a persistent, often uncomfortable and painful penile erection (priapism). If this is not treated immediately it   will lead to impotence.   You have numbness your arms or legs or you have a hard time moving them.   You have a hard time with speech.   You have a fever or persistent symptoms for more than 2-3 days.   You have a fever and your symptoms suddenly get worse.   You have signs or symptoms of infection.  These include:   Chills.   Abnormal tiredness (lethargy).   Irritability.   Poor eating.   Vomiting.   You develop pain that is not helped with medicine.   You develop shortness of breath.  You have pain in your chest.   You are coughing up pus-like or bloody sputum.   You develop a stiff neck.  Your feet or hands swell or have pain.  Your abdomen appears bloated.  You develop joint pain. MAKE SURE YOU:  Understand these instructions.   This information is not intended to replace advice given to you by your health care provider. Make sure you discuss any questions you have with your health care provider.   Document Released: 09/12/2005 Document Revised: 06/25/2014 Document Reviewed: 01/14/2013 Elsevier Interactive Patient Education 2016 Elsevier Inc.  

## 2015-10-25 NOTE — Progress Notes (Signed)
Patient ID: Patrick [name], male   DOB: Jun 03, 1956, 60 y.o.   MRN: [id number]   Patrick [name], is a 60 y.o. male  [id number]  [id number]  DOB - Jul 06, 1955  Chief Complaint  Patient presents with  . Follow-up        Subjective:   Patrick [name] is a 60 y.o. male with history of sickle cell anemia and chronic kidney disease here today for a follow up visit. Patient was seen here last month with complaints of upper respiratory symptoms for about 10 days duration on his return from a cruise to the [place] patient was treated with Zithromax and expectorant. Today he is complaining of ongoing cough with thick phlegm, cough is chesty but no chest pain. He has baseline mild shortness of breath from chronic smoking. Patient continues to smoke heavily. He denies any fever or chills, no headache, no joint pain, no sore throat. He has no urinary symptoms, no change in bowel habit. Patient also need refill on his pain medication.  Problem  Cough With Sputum  Acute Upper Respiratory Infection    ALLERGIES: No Known Allergies  PAST MEDICAL HISTORY: Past Medical History  Diagnosis Date  . Swelling of extremity, right   . Swelling abdomen   . Swelling of extremity, left   . Blood transfusion     "I've had a bunch of them"  . Blood dyscrasia   . Legally blind   . Sickle cell disease, type SS ([place])   . [id number])     "probably weekly" (01/13/2014)  . Gout   . Chronic kidney disease (CKD), stage III (moderate)   . CHF (congestive heart failure) ([place])     followed by hf clinic    MEDICATIONS AT HOME: Prior to Admission medications   Medication Sig Start Date End Date Taking? Authorizing Provider  allopurinol (ZYLOPRIM) 100 MG tablet Take 1 tablet (100 mg total) by mouth daily. 08/11/15  Yes Micheline Chapman, NP  aspirin EC 81 MG tablet Take 81 mg by mouth daily.   Yes Historical Provider, MD  benzonatate (TESSALON) 100 MG capsule Take 1 capsule (100 mg  total) by mouth 3 (three) times daily as needed for cough. 10/25/15  Yes [name], MD  calcitRIOL (ROCALTROL) 0.25 MCG capsule Take 0.25 mcg by mouth daily.    Yes Historical Provider, MD  carvedilol (COREG) 25 MG tablet TAKE 1 TABLET (25 MG TOTAL) BY MOUTH 2 (TWO) TIMES DAILY WITH A MEAL. 07/04/15  Yes Jolaine Artist, MD  folic acid (FOLVITE) 1 MG tablet TAKE 1 TABLET (1 MG TOTAL) BY MOUTH DAILY. 08/11/15  Yes Micheline Chapman, NP  furosemide (LASIX) 40 MG tablet TAKE 1&1/2 TABLET ONCE DAILY 08/29/15  Yes Jolaine Artist, MD  hydrALAZINE (APRESOLINE) 50 MG tablet TAKE 1.5 TABLETS (75 MG TOTAL) BY MOUTH 3 (THREE) TIMES DAILY. 05/10/15  Yes Jolaine Artist, MD  isosorbide mononitrate (IMDUR) 60 MG 24 hr tablet TAKE 1 TABLET BY MOUTH EVERY DAY 07/28/15  Yes Jolaine Artist, MD  metolazone (ZAROXOLYN) 2.5 MG tablet TAKE 1 TABLET BY MOUTH AS NEEDED 05/31/15  Yes Jolaine Artist, MD  oxyCODONE (ROXICODONE) 5 MG immediate release tablet Take 1 tablet (5 mg total) by mouth every 4 (four) hours as needed for moderate pain or severe pain. 10/25/15  Yes [name], MD  polyethylene glycol powder (GLYCOLAX/MIRALAX) powder Take 17 g by mouth daily. 02/17/15  Yes Micheline Chapman, NP  doxycycline (VIBRA-TABS) 100 MG  tablet Take 1 tablet (100 mg total) by mouth 2 (two) times daily. 10/25/15 11/03/15  [name], MD  guaiFENesin-dextromethorphan (ROBITUSSIN DM) 100-10 MG/5ML syrup Take 5 mLs by mouth every 4 (four) hours as needed for cough. 10/25/15   [name], MD  hydroxyurea (HYDREA) 500 MG capsule TAKE ONE CAPSULE BY MOUTH DAILY . MAY TAKE WITH FOOD TO MINIMIZE GI SIDE EFFECTS Patient not taking: Reported on 10/25/2015 03/07/15   Micheline Chapman, NP     Objective:   Filed Vitals:   10/25/15 1106  BP: 126/71  Pulse: 74  Temp: 98.2 F (36.8 C)  TempSrc: Oral  Resp: 18  Height: 5\' 10"  (1.778 m)  Weight: 146 lb (66.225 kg)  SpO2: 99%    Exam General  appearance : Awake, alert, not in any distress. Speech Clear. Not toxic looking HEENT: Atraumatic and Normocephalic, pupils equally reactive to light and accomodation Neck: supple, no JVD. No cervical lymphadenopathy.  Chest:Good air entry bilaterally, no added sounds  CVS: S1 S2 regular, no murmurs.  Abdomen: Bowel sounds present, Non tender and not distended with no gaurding, rigidity or rebound. Extremities: B/L Lower Ext shows no edema, both legs are warm to touch Neurology: Awake alert, and oriented X 3, CN II-XII intact, Non focal Skin:No Rash  Data Review Lab Results  Component Value Date   HGBA1C <4.0 06/07/2011     Assessment & Plan   1. Hb-SS disease without crisis ([place])  - oxyCODONE (ROXICODONE) 5 MG immediate release tablet; Take 1 tablet (5 mg total) by mouth every 4 (four) hours as needed for moderate pain or severe pain.  Dispense: 60 tablet; Refill: 0  2. Cough with sputum  - doxycycline (VIBRA-TABS) 100 MG tablet; Take 1 tablet (100 mg total) by mouth 2 (two) times daily.  Dispense: 20 tablet; Refill: 0 - benzonatate (TESSALON) 100 MG capsule; Take 1 capsule (100 mg total) by mouth 3 (three) times daily as needed for cough.  Dispense: 30 capsule; Refill: 0 - guaiFENesin-dextromethorphan (ROBITUSSIN DM) 100-10 MG/5ML syrup; Take 5 mLs by mouth every 4 (four) hours as needed for cough.  Dispense: 480 mL; Refill: 0  3. Acute upper respiratory infection  - benzonatate (TESSALON) 100 MG capsule; Take 1 capsule (100 mg total) by mouth 3 (three) times daily as needed for cough.  Dispense: 30 capsule; Refill: 0  4. Tobacco abuse  Azul was counseled on the dangers of tobacco use, and was advised to quit. Reviewed strategies to maximize success, including removing cigarettes and smoking materials from environment, stress management and support of family/friends.  Patient have been counseled extensively about nutrition and exercise  Return in about 4 weeks (around  11/22/2015) for Lab Draw: CBCD, CMP,, Sickle Cell Disease/Pain.  The patient was given clear instructions to go to ER or return to medical center if symptoms don't improve, worsen or new problems develop. The patient verbalized understanding. The patient was told to call to get lab results if they haven't heard anything in the next week.   This note has been created with Surveyor, quantity. Any transcriptional errors are unintentional.    Angelica Chessman, MD, [place], [place]arilyn [place], [place] and [place], [place]   10/25/2015, 11:38 AM

## 2015-11-24 DIAGNOSIS — N189 Chronic kidney disease, unspecified: Secondary | ICD-10-CM | POA: Diagnosis not present

## 2015-11-24 DIAGNOSIS — I770 Arteriovenous fistula, acquired: Secondary | ICD-10-CM | POA: Diagnosis not present

## 2015-11-24 DIAGNOSIS — N2581 Secondary hyperparathyroidism of renal origin: Secondary | ICD-10-CM | POA: Diagnosis not present

## 2015-11-24 DIAGNOSIS — M109 Gout, unspecified: Secondary | ICD-10-CM | POA: Diagnosis not present

## 2015-11-24 DIAGNOSIS — D649 Anemia, unspecified: Secondary | ICD-10-CM | POA: Diagnosis not present

## 2015-11-24 DIAGNOSIS — D571 Sickle-cell disease without crisis: Secondary | ICD-10-CM | POA: Diagnosis not present

## 2015-11-24 DIAGNOSIS — Z720 Tobacco use: Secondary | ICD-10-CM | POA: Diagnosis not present

## 2015-11-24 DIAGNOSIS — I5020 Unspecified systolic (congestive) heart failure: Secondary | ICD-10-CM | POA: Diagnosis not present

## 2015-11-24 DIAGNOSIS — I129 Hypertensive chronic kidney disease with stage 1 through stage 4 chronic kidney disease, or unspecified chronic kidney disease: Secondary | ICD-10-CM | POA: Diagnosis not present

## 2015-11-25 ENCOUNTER — Other Ambulatory Visit: Payer: Self-pay | Admitting: Family Medicine

## 2015-11-25 ENCOUNTER — Telehealth: Payer: Self-pay | Admitting: Hematology

## 2015-11-25 ENCOUNTER — Other Ambulatory Visit: Payer: PPO

## 2015-11-25 ENCOUNTER — Telehealth: Payer: Self-pay | Admitting: Family Medicine

## 2015-11-25 DIAGNOSIS — D571 Sickle-cell disease without crisis: Secondary | ICD-10-CM

## 2015-11-25 NOTE — Telephone Encounter (Signed)
Patient's home number not working. / Patent attorney for Patrick [name] at [phone] advising to have patient give [place] a call at the office. /

## 2015-11-25 NOTE — Telephone Encounter (Signed)
Mr. Argent was evaluated at [place]. His hemoglobin this am was 6.0 at their office. Will scheduled patient for a hemoglobin & hematocrit and type and screen on 11/28/2015.    Dorena Dew, FNP

## 2015-11-25 NOTE — Telephone Encounter (Signed)
Left message advising patient to call office. /

## 2015-11-28 ENCOUNTER — Ambulatory Visit (HOSPITAL_COMMUNITY)
Admission: RE | Admit: 2015-11-28 | Discharge: 2015-11-28 | Disposition: A | Discharge status: Home / Self Care | Payer: PPO | Source: Ambulatory Visit | Attending: Family Medicine | Admitting: Family Medicine

## 2015-11-28 VITALS — BP 120/73 | HR 76 | Temp 97.8°F | Resp 16

## 2015-11-28 DIAGNOSIS — D5700 Hb-SS disease with crisis, unspecified: Secondary | ICD-10-CM | POA: Diagnosis not present

## 2015-11-28 DIAGNOSIS — D571 Sickle-cell disease without crisis: Secondary | ICD-10-CM

## 2015-11-28 LAB — CBC WITH DIFFERENTIAL/PLATELET
Basophils Absolute: 0.1 10*3/uL (ref 0.0–0.1)
Basophils Relative: 1 %
Eosinophils Absolute: 0.2 10*3/uL (ref 0.0–0.7)
Eosinophils Relative: 3 %
HCT: 15 % — ABNORMAL LOW (ref 39.0–52.0)
Hemoglobin: 5.8 g/dL — CL (ref 13.0–17.0)
Lymphocytes Relative: 15 %
Lymphs Abs: 1.2 10*3/uL (ref 0.7–4.0)
MCH: 33.7 pg (ref 26.0–34.0)
MCHC: 37.5 g/dL — ABNORMAL HIGH (ref 30.0–36.0)
MCV: 90.4 fL (ref 78.0–100.0)
Monocytes Absolute: 1.2 10*3/uL — ABNORMAL HIGH (ref 0.1–1.0)
Monocytes Relative: 15 %
Neutro Abs: 5.3 10*3/uL (ref 1.7–7.7)
Neutrophils Relative %: 66 %
Platelets: 112 10*3/uL — ABNORMAL LOW (ref 150–400)
RBC: 1.66 MIL/uL — ABNORMAL LOW (ref 4.22–5.81)
RDW: 22.5 % — ABNORMAL HIGH (ref 11.5–15.5)
WBC: 8 10*3/uL (ref 4.0–10.5)
nRBC: 3 /100 WBC — ABNORMAL HIGH

## 2015-11-28 LAB — PREPARE RBC (CROSSMATCH)

## 2015-11-28 MED ORDER — SODIUM CHLORIDE 0.9 % IV SOLN
Freq: Once | INTRAVENOUS | Status: AC
Start: 2015-11-28 — End: 2015-11-28
  Administered 2015-11-28: 14:00:00 via INTRAVENOUS

## 2015-11-28 NOTE — Discharge Instructions (Signed)

## 2015-11-28 NOTE — Procedures (Signed)
[facility]  Procedure Note  CHRISHAUN STOEN [id number] DOB: 01-25-1956 DOA: 11/28/2015   PCP: Dorena Dew, FNP   Associated Diagnosis: Sickle cell crisis  Procedure Note: Transfusion of 1 unit PRBCs   Condition During Procedure:  Pt tolerated well   Condition at Discharge:  Pt alert, oriented, ambulatory; no complications noted   Nigel Sloop, [facility]

## 2015-11-28 NOTE — Progress Notes (Signed)
CRITICAL VALUE ALERT  Critical value received:  Hgb 5.8  Date of notification:  11/28/15  Time of notification:  [id number]  Critical value read back:Yes.    Nurse who received alert:  B. Tamala Julian   MD notified (1st page):  L. Hollis  Time of first page:  76  MD notified (2nd page):  Time of second page:  Responding MD:  L. Hollis  Time MD responded:  [id number]

## 2015-11-28 NOTE — Progress Notes (Signed)
Provider: L. Hollis NP  Associated Diagnosis:Sickle cell anemia without crisis [facility]) - Primary  Procedure: Transfusion of 1 unit PRBC's per order, as Hgb. Was 5.8 today.   Tolerated transfusion without reaction or side effects.  Went over discharge instructions and copy given to patient. Alert, oriented and ambulatory at time of discharge. Discharged to home.

## 2015-11-29 LAB — TYPE AND SCREEN
ABO/RH(D): AB POS
Antibody Screen: NEGATIVE
Unit division: 0

## 2015-12-16 ENCOUNTER — Other Ambulatory Visit (HOSPITAL_COMMUNITY): Payer: Self-pay | Admitting: Internal Medicine

## 2015-12-22 ENCOUNTER — Inpatient Hospital Stay (HOSPITAL_COMMUNITY)
Admission: EM | Admit: 2015-12-22 | Discharge: 2015-12-27 | DRG: 291 | Disposition: A | Discharge status: Home / Self Care | Payer: PPO | Attending: Internal Medicine | Admitting: Internal Medicine

## 2015-12-22 ENCOUNTER — Inpatient Hospital Stay (HOSPITAL_COMMUNITY): Payer: PPO

## 2015-12-22 ENCOUNTER — Encounter (HOSPITAL_COMMUNITY): Payer: Self-pay | Admitting: Nurse Practitioner

## 2015-12-22 ENCOUNTER — Emergency Department (HOSPITAL_COMMUNITY): Payer: PPO

## 2015-12-22 DIAGNOSIS — N19 Unspecified kidney failure: Secondary | ICD-10-CM | POA: Diagnosis not present

## 2015-12-22 DIAGNOSIS — I5023 Acute on chronic systolic (congestive) heart failure: Secondary | ICD-10-CM | POA: Diagnosis not present

## 2015-12-22 DIAGNOSIS — F17210 Nicotine dependence, cigarettes, uncomplicated: Secondary | ICD-10-CM | POA: Diagnosis present

## 2015-12-22 DIAGNOSIS — M109 Gout, unspecified: Secondary | ICD-10-CM | POA: Diagnosis not present

## 2015-12-22 DIAGNOSIS — D571 Sickle-cell disease without crisis: Secondary | ICD-10-CM | POA: Diagnosis not present

## 2015-12-22 DIAGNOSIS — I5022 Chronic systolic (congestive) heart failure: Secondary | ICD-10-CM | POA: Diagnosis not present

## 2015-12-22 DIAGNOSIS — Z79899 Other long term (current) drug therapy: Secondary | ICD-10-CM

## 2015-12-22 DIAGNOSIS — H548 Legal blindness, as defined in USA: Secondary | ICD-10-CM | POA: Diagnosis present

## 2015-12-22 DIAGNOSIS — I420 Dilated cardiomyopathy: Secondary | ICD-10-CM | POA: Diagnosis not present

## 2015-12-22 DIAGNOSIS — N2581 Secondary hyperparathyroidism of renal origin: Secondary | ICD-10-CM | POA: Diagnosis not present

## 2015-12-22 DIAGNOSIS — J9601 Acute respiratory failure with hypoxia: Secondary | ICD-10-CM | POA: Diagnosis present

## 2015-12-22 DIAGNOSIS — R1900 Intra-abdominal and pelvic swelling, mass and lump, unspecified site: Secondary | ICD-10-CM | POA: Diagnosis not present

## 2015-12-22 DIAGNOSIS — D539 Nutritional anemia, unspecified: Secondary | ICD-10-CM | POA: Diagnosis not present

## 2015-12-22 DIAGNOSIS — E871 Hypo-osmolality and hyponatremia: Secondary | ICD-10-CM | POA: Diagnosis present

## 2015-12-22 DIAGNOSIS — I130 Hypertensive heart and chronic kidney disease with heart failure and stage 1 through stage 4 chronic kidney disease, or unspecified chronic kidney disease: Principal | ICD-10-CM | POA: Diagnosis present

## 2015-12-22 DIAGNOSIS — D508 Other iron deficiency anemias: Secondary | ICD-10-CM | POA: Diagnosis not present

## 2015-12-22 DIAGNOSIS — N179 Acute kidney failure, unspecified: Secondary | ICD-10-CM | POA: Diagnosis present

## 2015-12-22 DIAGNOSIS — I272 Other secondary pulmonary hypertension: Secondary | ICD-10-CM | POA: Diagnosis present

## 2015-12-22 DIAGNOSIS — I5043 Acute on chronic combined systolic (congestive) and diastolic (congestive) heart failure: Secondary | ICD-10-CM | POA: Diagnosis present

## 2015-12-22 DIAGNOSIS — I340 Nonrheumatic mitral (valve) insufficiency: Secondary | ICD-10-CM | POA: Diagnosis not present

## 2015-12-22 DIAGNOSIS — I4891 Unspecified atrial fibrillation: Secondary | ICD-10-CM | POA: Diagnosis present

## 2015-12-22 DIAGNOSIS — I5033 Acute on chronic diastolic (congestive) heart failure: Secondary | ICD-10-CM

## 2015-12-22 DIAGNOSIS — R0602 Shortness of breath: Secondary | ICD-10-CM

## 2015-12-22 DIAGNOSIS — I4821 Permanent atrial fibrillation: Secondary | ICD-10-CM | POA: Diagnosis not present

## 2015-12-22 DIAGNOSIS — D638 Anemia in other chronic diseases classified elsewhere: Secondary | ICD-10-CM | POA: Diagnosis not present

## 2015-12-22 DIAGNOSIS — E211 Secondary hyperparathyroidism, not elsewhere classified: Secondary | ICD-10-CM | POA: Diagnosis not present

## 2015-12-22 DIAGNOSIS — I480 Paroxysmal atrial fibrillation: Secondary | ICD-10-CM | POA: Diagnosis not present

## 2015-12-22 DIAGNOSIS — I428 Other cardiomyopathies: Secondary | ICD-10-CM | POA: Diagnosis not present

## 2015-12-22 DIAGNOSIS — N189 Chronic kidney disease, unspecified: Secondary | ICD-10-CM

## 2015-12-22 DIAGNOSIS — Z7982 Long term (current) use of aspirin: Secondary | ICD-10-CM | POA: Diagnosis not present

## 2015-12-22 DIAGNOSIS — I058 Other rheumatic mitral valve diseases: Secondary | ICD-10-CM

## 2015-12-22 DIAGNOSIS — N184 Chronic kidney disease, stage 4 (severe): Secondary | ICD-10-CM | POA: Diagnosis not present

## 2015-12-22 DIAGNOSIS — I081 Rheumatic disorders of both mitral and tricuspid valves: Secondary | ICD-10-CM | POA: Diagnosis not present

## 2015-12-22 DIAGNOSIS — J449 Chronic obstructive pulmonary disease, unspecified: Secondary | ICD-10-CM | POA: Diagnosis present

## 2015-12-22 DIAGNOSIS — E611 Iron deficiency: Secondary | ICD-10-CM | POA: Diagnosis not present

## 2015-12-22 DIAGNOSIS — D631 Anemia in chronic kidney disease: Secondary | ICD-10-CM

## 2015-12-22 DIAGNOSIS — R0689 Other abnormalities of breathing: Secondary | ICD-10-CM | POA: Diagnosis not present

## 2015-12-22 DIAGNOSIS — K8020 Calculus of gallbladder without cholecystitis without obstruction: Secondary | ICD-10-CM | POA: Diagnosis not present

## 2015-12-22 DIAGNOSIS — I509 Heart failure, unspecified: Secondary | ICD-10-CM | POA: Diagnosis not present

## 2015-12-22 DIAGNOSIS — I481 Persistent atrial fibrillation: Secondary | ICD-10-CM | POA: Diagnosis not present

## 2015-12-22 DIAGNOSIS — D649 Anemia, unspecified: Secondary | ICD-10-CM | POA: Diagnosis present

## 2015-12-22 HISTORY — DX: Reserved for inherently not codable concepts without codable children: IMO0001

## 2015-12-22 LAB — CBC
HCT: 16.7 % — ABNORMAL LOW (ref 39.0–52.0)
Hemoglobin: 6 g/dL — CL (ref 13.0–17.0)
MCH: 32.1 pg (ref 26.0–34.0)
MCHC: 35.9 g/dL (ref 30.0–36.0)
MCV: 89.3 fL (ref 78.0–100.0)
Platelets: 130 10*3/uL — ABNORMAL LOW (ref 150–400)
RBC: 1.87 MIL/uL — ABNORMAL LOW (ref 4.22–5.81)
RDW: 23.8 % — ABNORMAL HIGH (ref 11.5–15.5)
WBC: 8.3 10*3/uL (ref 4.0–10.5)

## 2015-12-22 LAB — BASIC METABOLIC PANEL
Anion gap: 8 (ref 5–15)
BUN: 98 mg/dL — ABNORMAL HIGH (ref 6–20)
CO2: 21 mmol/L — ABNORMAL LOW (ref 22–32)
Calcium: 8.8 mg/dL — ABNORMAL LOW (ref 8.9–10.3)
Chloride: 109 mmol/L (ref 101–111)
Creatinine, Ser: 4.09 mg/dL — ABNORMAL HIGH (ref 0.61–1.24)
GFR calc Af Amer: 17 mL/min — ABNORMAL LOW (ref >60)
GFR calc non Af Amer: 15 mL/min — ABNORMAL LOW (ref >60)
Glucose, Bld: 97 mg/dL (ref 65–99)
Potassium: 4.5 mmol/L (ref 3.5–5.1)
Sodium: 138 mmol/L (ref 135–145)

## 2015-12-22 LAB — RETICULOCYTES
RBC.: 1.83 MIL/uL — ABNORMAL LOW (ref 4.22–5.81)
Retic Count, Absolute: 267.2 10*3/uL — ABNORMAL HIGH (ref 19.0–186.0)
Retic Ct Pct: 14.6 % — ABNORMAL HIGH (ref 0.4–3.1)

## 2015-12-22 LAB — PREPARE RBC (CROSSMATCH)

## 2015-12-22 LAB — LACTATE DEHYDROGENASE: LDH: 656 U/L — ABNORMAL HIGH (ref 98–192)

## 2015-12-22 LAB — BRAIN NATRIURETIC PEPTIDE: B Natriuretic Peptide: 2524.5 pg/mL — ABNORMAL HIGH (ref 0.0–100.0)

## 2015-12-22 LAB — I-STAT TROPONIN, ED: Troponin i, poc: 0.02 ng/mL (ref 0.00–0.08)

## 2015-12-22 MED ORDER — HYDROMORPHONE HCL 1 MG/ML IJ SOLN
1.0000 mg | Freq: Once | INTRAMUSCULAR | Status: AC
  Administered 2015-12-22: 1 mg via INTRAVENOUS
  Filled 2015-12-22: qty 1

## 2015-12-22 MED ORDER — ASPIRIN EC 81 MG PO TBEC
81.0000 mg | DELAYED_RELEASE_TABLET | Freq: Every day | ORAL | Status: DC
  Administered 2015-12-22 – 2015-12-27 (×6): 81 mg via ORAL
  Filled 2015-12-22 (×6): qty 1

## 2015-12-22 MED ORDER — HYDRALAZINE HCL 50 MG PO TABS
75.0000 mg | ORAL_TABLET | Freq: Three times a day (TID) | ORAL | Status: DC
  Administered 2015-12-22 – 2015-12-27 (×11): 75 mg via ORAL
  Filled 2015-12-22 (×13): qty 1

## 2015-12-22 MED ORDER — CARVEDILOL 25 MG PO TABS
25.0000 mg | ORAL_TABLET | Freq: Two times a day (BID) | ORAL | Status: DC
  Administered 2015-12-22 – 2015-12-23 (×3): 25 mg via ORAL
  Filled 2015-12-22 (×3): qty 1

## 2015-12-22 MED ORDER — SODIUM CHLORIDE 0.9 % IV SOLN
Freq: Once | INTRAVENOUS | Status: AC
  Administered 2015-12-22: 18:00:00 via INTRAVENOUS

## 2015-12-22 MED ORDER — HEPARIN SODIUM (PORCINE) 5000 UNIT/ML IJ SOLN
5000.0000 [IU] | Freq: Three times a day (TID) | INTRAMUSCULAR | Status: DC
  Administered 2015-12-22 – 2015-12-24 (×4): 5000 [IU] via SUBCUTANEOUS
  Filled 2015-12-22 (×8): qty 1

## 2015-12-22 MED ORDER — OXYCODONE HCL 5 MG PO TABS
5.0000 mg | ORAL_TABLET | ORAL | Status: DC | PRN
  Administered 2015-12-22 – 2015-12-25 (×9): 5 mg via ORAL
  Filled 2015-12-22 (×9): qty 1

## 2015-12-22 MED ORDER — FUROSEMIDE 10 MG/ML IJ SOLN
40.0000 mg | Freq: Two times a day (BID) | INTRAMUSCULAR | Status: DC
  Administered 2015-12-22: 40 mg via INTRAVENOUS
  Filled 2015-12-22: qty 4

## 2015-12-22 MED ORDER — SODIUM CHLORIDE 0.9 % IV SOLN
INTRAVENOUS | Status: DC
  Administered 2015-12-22: 18:00:00 via INTRAVENOUS

## 2015-12-22 MED ORDER — FUROSEMIDE 10 MG/ML IJ SOLN
40.0000 mg | Freq: Once | INTRAMUSCULAR | Status: AC
  Administered 2015-12-22: 40 mg via INTRAVENOUS
  Filled 2015-12-22: qty 4

## 2015-12-22 MED ORDER — ISOSORBIDE MONONITRATE ER 60 MG PO TB24
60.0000 mg | ORAL_TABLET | Freq: Every day | ORAL | Status: DC
  Administered 2015-12-22 – 2015-12-27 (×6): 60 mg via ORAL
  Filled 2015-12-22 (×6): qty 1

## 2015-12-22 MED ORDER — FOLIC ACID 1 MG PO TABS
1.0000 mg | ORAL_TABLET | Freq: Every day | ORAL | Status: DC
  Administered 2015-12-22 – 2015-12-27 (×6): 1 mg via ORAL
  Filled 2015-12-22 (×6): qty 1

## 2015-12-22 MED ORDER — DEXTROSE 5 % IV SOLN
120.0000 mg | Freq: Two times a day (BID) | INTRAVENOUS | Status: AC
  Administered 2015-12-22 – 2015-12-25 (×7): 120 mg via INTRAVENOUS
  Filled 2015-12-22 (×7): qty 12

## 2015-12-22 MED ORDER — POLYETHYLENE GLYCOL 3350 17 GM/SCOOP PO POWD
17.0000 g | Freq: Every day | ORAL | Status: DC
  Filled 2015-12-22: qty 255

## 2015-12-22 MED ORDER — POLYETHYLENE GLYCOL 3350 17 G PO PACK
17.0000 g | PACK | Freq: Every day | ORAL | Status: DC | PRN
  Administered 2015-12-25: 17 g via ORAL
  Filled 2015-12-22: qty 1

## 2015-12-22 MED ORDER — CALCITRIOL 0.25 MCG PO CAPS
0.2500 ug | ORAL_CAPSULE | Freq: Every day | ORAL | Status: DC
  Administered 2015-12-22 – 2015-12-27 (×6): 0.25 ug via ORAL
  Filled 2015-12-22 (×6): qty 1

## 2015-12-22 MED ORDER — POLYETHYLENE GLYCOL 3350 17 G PO PACK
17.0000 g | PACK | Freq: Every day | ORAL | Status: DC
  Administered 2015-12-22 – 2015-12-27 (×6): 17 g via ORAL
  Filled 2015-12-22 (×6): qty 1

## 2015-12-22 MED ORDER — METOLAZONE 2.5 MG PO TABS
2.5000 mg | ORAL_TABLET | Freq: Every day | ORAL | Status: DC
  Administered 2015-12-22 – 2015-12-27 (×6): 2.5 mg via ORAL
  Filled 2015-12-22 (×7): qty 1

## 2015-12-22 NOTE — Consult Note (Signed)
Patient ID: Patrick [name] MRN: [id number] DOB/AGE: 1955/08/24 60 y.o.  Admit date: 12/22/2015 Referring Physician: Zigmund Daniel Primary Physician: Dorena Dew, FNP Primary Cardiologist: Bensimhon (CHF clinic) Reason for Consultation: acute CHF  HPI: 60 yo male with history of chronic systolic CHF, non-ischemic cardiomyopathy, sickle cell anemia, chronic kidney disease and tobacco abuse who is admitted today with worsening dyspnea. He is followed in the CHF clinic. His LV systolic dysfunction was diagnosed in December 2012. Cardiac cath in December 2012 with no evidence of CAD. He has been admitted several times over the last three years with anemia and volume overload. He has been followed in the CHF clinic and is not on a Ace-inhibitor due to renal failure. He is not on spironolactone due to hyperkalemia. He is followed in Nephrology by Dr. Lorrene Reid and has a right upper extremity fistula that is maturing. He has not been started on HD. Dry weight in 2015 around 140 lbs. Last echo June 2015 with LVEF=40%, dilated LV cavity. Chest x-ray today with small bilateral pleural effusions and mild interstitial edema. EKG with atrial fibrillation.   He tells me today that he has been having progressive worsening of dyspnea for several weeks along with abdominal distention. No chest pain, dizziness, near syncope, syncope, fever, chills, rigors, cough.     Past Medical History  Diagnosis Date  . Swelling of extremity, right   . Swelling abdomen   . Swelling of extremity, left   . Blood transfusion     "I've had a bunch of them"  . Blood dyscrasia   . Legally blind   . Sickle cell disease, type SS ([place])   . [id number])     "probably weekly" (01/13/2014)  . Gout   . Chronic kidney disease (CKD), stage III (moderate)   . CHF (congestive heart failure) ([place])     followed by hf clinic  . Shortness of breath dyspnea      Past Surgical History  Procedure Laterality Date  . Cardiac  catheterization  05/2011  . Bascilic vein transposition Right 04/12/2014    Procedure: BASILIC VEIN TRANSPOSITION;  Surgeon: Elam Dutch, MD;  Location: [place];  Service: Vascular;  Laterality: Right;  . Left and right heart catheterization with coronary angiogram N/A 06/11/2011    Procedure: LEFT AND RIGHT HEART CATHETERIZATION WITH CORONARY ANGIOGRAM;  Surgeon: Larey Dresser, MD;  Location: [facility] CATH LAB;  Service: Cardiovascular;  Laterality: N/A;    Family History  Problem Relation Age of Onset  . Alcohol abuse Mother   . Liver disease Mother   . Cirrhosis Mother   . Heart attack Mother     Social History   Social History  . Marital Status: Divorced    Spouse Name: N/A  . Number of Children: N/A  . Years of Education: N/A   Occupational History  . Not on file.   Social History Main Topics  . Smoking status: Former Smoker -- 0.25 packs/day for 41 years    Types: Cigarettes    Quit date: 12/08/2015  . Smokeless tobacco: Never Used  . Alcohol Use: 1.8 oz/week    1 Glasses of wine, 2 Cans of beer per week     Comment: occasional  . Drug Use: No  . Sexual Activity: Not Currently   Other Topics Concern  . Not on file   Social History Narrative       No Known Allergies  Prior to Admission medications   Medication Sig Start  Date End Date Taking? Authorizing Provider  allopurinol (ZYLOPRIM) 100 MG tablet Take 1 tablet (100 mg total) by mouth daily. 08/11/15  Yes Micheline Chapman, NP  aspirin EC 81 MG tablet Take 81 mg by mouth daily.   Yes Historical Provider, MD  calcitRIOL (ROCALTROL) 0.25 MCG capsule Take 0.25 mcg by mouth daily.    Yes Historical Provider, MD  carvedilol (COREG) 25 MG tablet TAKE 1 TABLET (25 MG TOTAL) BY MOUTH 2 (TWO) TIMES DAILY WITH A MEAL. 07/04/15  Yes Jolaine Artist, MD  folic acid (FOLVITE) 1 MG tablet TAKE 1 TABLET (1 MG TOTAL) BY MOUTH DAILY. 08/11/15  Yes Micheline Chapman, NP  furosemide (LASIX) 40 MG tablet TAKE 1&1/2 TABLET ONCE  DAILY 08/29/15  Yes Jolaine Artist, MD  guaiFENesin-dextromethorphan (ROBITUSSIN DM) 100-10 MG/5ML syrup Take 5 mLs by mouth every 4 (four) hours as needed for cough. 10/25/15  Yes [name], MD  hydrALAZINE (APRESOLINE) 50 MG tablet TAKE 1.5 TABLETS (75 MG TOTAL) BY MOUTH 3 (THREE) TIMES DAILY. 05/10/15  Yes Jolaine Artist, MD  isosorbide mononitrate (IMDUR) 60 MG 24 hr tablet TAKE 1 TABLET BY MOUTH EVERY DAY 12/16/15  Yes Jolaine Artist, MD  metolazone (ZAROXOLYN) 2.5 MG tablet TAKE 1 TABLET BY MOUTH AS NEEDED 05/31/15  Yes Jolaine Artist, MD  oxyCODONE (ROXICODONE) 5 MG immediate release tablet Take 1 tablet (5 mg total) by mouth every 4 (four) hours as needed for moderate pain or severe pain. 10/25/15  Yes [name], MD  polyethylene glycol powder (GLYCOLAX/MIRALAX) powder Take 17 g by mouth daily. 02/17/15  Yes Micheline Chapman, NP  benzonatate (TESSALON) 100 MG capsule Take 1 capsule (100 mg total) by mouth 3 (three) times daily as needed for cough. Patient not taking: Reported on 12/22/2015 10/25/15   [name], MD  hydroxyurea (HYDREA) 500 MG capsule TAKE ONE CAPSULE BY MOUTH DAILY . MAY TAKE WITH FOOD TO MINIMIZE GI SIDE EFFECTS Patient not taking: Reported on 10/25/2015 03/07/15   Micheline Chapman, NP   Hospital Medications:  . aspirin EC  81 mg Oral Daily  . calcitRIOL  0.25 mcg Oral Daily  . carvedilol  25 mg Oral BID WC  . folic acid  1 mg Oral Daily  . furosemide  40 mg Intravenous Q12H  . heparin  5,000 Units Subcutaneous Q8H  . hydrALAZINE  75 mg Oral Q8H  . isosorbide mononitrate  60 mg Oral Daily  . metolazone  2.5 mg Oral Daily  . polyethylene glycol  17 g Oral Daily    Review of systems complete and found to be negative unless listed above   Tele: He is not on telemetry at this time  Physical Exam: Blood pressure 126/77, pulse 88, temperature 97.7 F (36.5 C), temperature source Oral, resp. rate 20, height 5\' 11"  (1.803 m), weight  148 lb (67.132 kg), SpO2 92 %.    General: Well developed, well nourished, NAD  HEENT: OP clear, mucus membranes moist  SKIN: warm, dry. No rashes.  Neuro: No focal deficits  Musculoskeletal: Muscle strength 5/5 all ext  Psychiatric: Mood and affect normal  Neck: + JVD, no carotid bruits, no thyromegaly, no lymphadenopathy.  Lungs:Clear bilaterally, no wheezes, rhonci, crackles  Cardiovascular: Irreg irreg with no loud murmurs noted.   Abdomen:Soft. Bowel sounds present. Non-tender. Slightly distended Extremities: No lower extremity edema. Pulses are 2 + in the bilateral DP/PT.   Labs:   Lab Results  Component  Value Date   WBC 8.3 12/22/2015   HGB 6.0* 12/22/2015   HCT 16.7* 12/22/2015   MCV 89.3 12/22/2015   PLT 130* 12/22/2015     Recent Labs Lab 12/22/15 1233  NA 138  K 4.5  CL 109  CO2 21*  BUN 98*  CREATININE 4.09*  CALCIUM 8.8*  GLUCOSE 97   Chest x-ray:  COPD with pulmonary hyperinflation Cardiac enlargement. Pulmonary venous congestion. Small pleural effusions consistent with fluid overload. Slight interstitial edema. Negative for pneumonia. No mass lesion. IMPRESSION: COPD Mild fluid overload with early interstitial edema and small pleural Effusions.  EKG: Atrial fibrillation, rate 85 bpm. Poor R wave progression precordial leads.   ASSESSMENT AND PLAN:   1. Acute on chronic systolic CHF/Non-ischemic cardiomyopathy: Pt admitted with dyspnea and abdominal distention. He is up at least 5 lbs from his dry weight. He appears to be volume overloaded. He is also profoundly anemic and is now in atrial fibrillation. His renal function has worsened. Agree with attempt at gentle diuresis tonight with IV Lasix, carefully following renal function. Echo has been ordered by the primary team. I will ask the CHF team to see him tomorrow.   2. Anemia: Transfusion per primary team  3. Atrial fibrillation, new onset: This is a new problem. I cannot find where he has had  atrial fibrillation int he past. Will order telemetry now. Will repeat EKG in am. Will not start anti-coagulation tonight with anemia. His rate is currently controlled.    Signed: Lauree Chandler, MD 12/22/2015, 6:22 PM

## 2015-12-22 NOTE — H&P (Signed)
Hospital Admission Note Date: 12/22/2015  Patient name: Patrick [name] Medical record number: [id number] Date of birth: Jul 24, 1955 Age: 60 y.o. Gender: male PCP: Patrick Dew, FNP  Attending physician: Patrick Gamer, MD  Chief Complaint:DIfficulty breathing x 3 days  History of Present Illness:Pt is known to me from previous care as a opiate naive patient with [name] who presents with c/o difficulty breathing x 3 weeks which has progressively worsened. He states that he noted that his abdomen was "bloating" about 10 days ago so he increased his Lasix about 1 week  ago. His abdomen continued to bloat and he developed difficulty breathing in the last 24 hours. Thus he came to the ED. He reports decreased U/O, and feeling fatigued. He denies any fevers, chills, nausea, vomiting or diarrhea.   In the ED he was found to have a Cr. Of 4.09 and Hb of 6.0.  Saturations 92-100/5 on RA. CXR shows findings consistent with pulmonary edema.   Scheduled Meds: . sodium chloride   Intravenous Once   Continuous Infusions:  PRN Meds:. Allergies: Review of patient's allergies indicates no known allergies. Past Medical History  Diagnosis Date  . Swelling of extremity, right   . Swelling abdomen   . Swelling of extremity, left   . Blood transfusion     "I've had a bunch of them"  . Blood dyscrasia   . Legally blind   . Sickle cell disease, type SS ([place])   . [id number])     "probably weekly" (01/13/2014)  . Gout   . Chronic kidney disease (CKD), stage III (moderate)   . CHF (congestive heart failure) ([place])     followed by hf clinic   Past Surgical History  Procedure Laterality Date  . Cardiac catheterization  05/2011  . Bascilic vein transposition Right 04/12/2014    Procedure: BASILIC VEIN TRANSPOSITION;  Surgeon: Patrick Dutch, MD;  Location: [place];  Service: Vascular;  Laterality: Right;  . Left and right heart catheterization with coronary angiogram N/A 06/11/2011     Procedure: LEFT AND RIGHT HEART CATHETERIZATION WITH CORONARY ANGIOGRAM;  Surgeon: Patrick Dresser, MD;  Location: [facility] CATH LAB;  Service: Cardiovascular;  Laterality: N/A;   Family History  Problem Relation Age of Onset  . Alcohol abuse Mother   . Liver disease Mother   . Cirrhosis Mother   . Heart attack Mother    Social History   Social History  . Marital Status: Divorced    Spouse Name: N/A  . Number of Children: N/A  . Years of Education: N/A   Occupational History  . Not on file.   Social History Main Topics  . Smoking status: Current Every Day Smoker -- 0.25 packs/day for 41 years    Types: Cigarettes  . Smokeless tobacco: Never Used  . Alcohol Use: 1.8 oz/week    1 Glasses of wine, 2 Cans of beer per week     Comment: occasional  . Drug Use: No  . Sexual Activity: Not Currently   Other Topics Concern  . Not on file   Social History Narrative   Review of Systems: A comprehensive review of systems was negative except as noted in the HPI. Physical Exam: No intake or output data in the 24 hours ending 12/22/15 1653 General: Alert, awake, oriented x3, in no acute distress.  HEENT: [place]/AT PEERL, EOMI, anicteric Neck: Trachea midline,  no masses, no thyromegal,y no JVD, no carotid bruit OROPHARYNX:  Moist, No exudate/ erythema/lesions.  Heart:  Regular rate and rhythm, without murmurs, rubs, gallops, PMI non-displaced, no heaves or thrills on palpation.  Lungs: Clear to auscultation, no wheezing or rhonchi noted. No increased vocal fremitus resonant to percussion  Abdomen: Soft, nontender, distended, positive bowel sounds, no masses no hepatosplenomegaly noted. ??fluid. Abdomen somewhat tense but percussion consistent with fluid.  Neuro: No focal neurological deficits noted cranial nerves II through XII grossly intact.  Strength at functional baseline in bilateral upper and lower extremities. Musculoskeletal: No warm swelling or erythema around joints, no spinal  tenderness noted. Psychiatric: Patient alert and oriented x3, good insight and cognition, good recent to remote recall. Lymph node survey: No cervical axillary or inguinal lymphadenopathy noted.  Lab results:  Recent Labs  12/22/15 1233  NA 138  K 4.5  CL 109  CO2 21*  GLUCOSE 97  BUN 98*  CREATININE 4.09*  CALCIUM 8.8*   No results for input(s): AST, ALT, ALKPHOS, BILITOT, PROT, ALBUMIN in the last 72 hours. No results for input(s): LIPASE, AMYLASE in the last 72 hours.  Recent Labs  12/22/15 1233  WBC 8.3  HGB 6.0*  HCT 16.7*  MCV 89.3  PLT 130*   No results for input(s): CKTOTAL, CKMB, CKMBINDEX, TROPONINI in the last 72 hours. Invalid input(s): POCBNP No results for input(s): DDIMER in the last 72 hours. No results for input(s): HGBA1C in the last 72 hours. No results for input(s): CHOL, HDL, LDLCALC, TRIG, CHOLHDL, LDLDIRECT in the last 72 hours. No results for input(s): TSH, T4TOTAL, T3FREE, THYROIDAB in the last 72 hours.  Invalid input(s): FREET3  Recent Labs  12/22/15 1354  RETICCTPCT 14.6*   Imaging results:  Dg Chest 2 View  12/22/2015  CLINICAL DATA:  Short of breath. Sickle cell. History of hypertension, kidney failure, CHF EXAM: CHEST  2 VIEW COMPARISON:  01/13/2014 FINDINGS: COPD with pulmonary hyperinflation Cardiac enlargement. Pulmonary venous congestion. Small pleural effusions consistent with fluid overload. Slight interstitial edema. Negative for pneumonia.  No mass lesion. IMPRESSION: COPD Mild fluid overload with early interstitial edema and small pleural effusions. Electronically Signed   By: Franchot Gallo M.D.   On: 12/22/2015 11:46   Dg Abd 1 View  12/22/2015  CLINICAL DATA:  Abdominal swelling for 3 weeks with pain for 4 days. Constipation and shortness of breath. Chronic kidney disease. Sickle cell disease. EXAM: ABDOMEN - 1 VIEW COMPARISON:  02/07/2013 ultrasound.  CT 02/07/2013 FINDINGS: Single supine view abdomen and pelvis. A gallstone  measures 1.8 cm. Non-obstructive bowel gas pattern. No calcific densities over the kidneys. No appendicolith. Probable phleboliths in the left hemipelvis. Probable renal osteodystrophy, with heterogeneous increased marrow density throughout. IMPRESSION: No evidence of bowel obstruction or free intraperitoneal air. Cholelithiasis. Electronically Signed   By: Abigail Miyamoto M.D.   On: 12/22/2015 16:19   Other results: EKG: normal EKG, normal sinus rhythm, unchanged from previous tracings, nonspecific ST and T waves changes.   Assessment and Plan: 1. Acute on Chronic Kidney Disease  IV: Pt has had a chronic progress of renal insufficiency which was likely worsened by increase in lasix over the last several days. I feel that patient is intravascularly depleted but may be third spacing. Will try gentle diuresis with Zaroxalyn and IV lasix. Hold allopurinol. 2. Acute respiratory failure with hypoxia: On examination he appears to have some pulmonary edema and ascites which may be contributing to respiratory embarrassment. Will try gentle diuresis and consider therapeutic paracentesis if respiratory condition does not improve after transfusion. 3. Pulmonary Edema: Pt has  some evidence of fluid overload on CXR. Will support with Oxygen and try gentle diuresis as he likely cannot tolerate agressive diuresis at this time.  4. Anemia of chronic disease: His Hb is just a little below his baseline. However I feel that this level represents a hemo-concentrated state. Will transfuse 1 unit RBC and re-evaluate Oxygenation at that time.  5. Hb [place] without crisis: Pt's pain controlled. Will resume oral analgesics.  6. Chronic Systolic Heart failure: His last ECHO was 2 years ago. He has been under the care of Dr. Missy Sabins in the heart failure clinic. Will obtain ECHO and BNP. Will also consult Cardiology.  7. DVT Prophylaxis: Heparin  Time spent 50 minutes.  Alwaleed Obeso A. 12/22/2015, 4:53 PM

## 2015-12-22 NOTE — ED Notes (Signed)
He c/o 3 week history of increasing SOB, abd pain and swelling, back pain, fatigue. He tried OTC medications for constipation and increased his lasix dosage with no relief of symptoms. Abdomen is distended. He is alert, breathing easily, able to speak in full sentences.

## 2015-12-22 NOTE — ED Provider Notes (Signed)
CSN: [id number]     Arrival date & time 12/22/15  1059 History   First MD Initiated Contact with Patient 12/22/15 1223     Chief Complaint  Patient presents with  . Shortness of Breath     (Consider location/radiation/quality/duration/timing/severity/associated sxs/prior Treatment) Patient is a 60 y.o. male presenting with shortness of breath. The history is provided by the patient. No language interpreter was used.  Shortness of Breath Severity:  Moderate Onset quality:  Gradual Duration:  3 weeks Timing:  Constant Chronicity:  New Context: activity   Relieved by:  Nothing Worsened by:  Nothing tried Ineffective treatments:  None tried Associated symptoms: no fever   Risk factors: no hx of PE/DVT   Pt reports he has had increased shortness of breath and abdominal swelling for 3 weeks.  Pt reports increased fatiigue.  Pt reports he has increased his lasix.  Pt has a history of Sickle cell.   Past Medical History  Diagnosis Date  . Swelling of extremity, right   . Swelling abdomen   . Swelling of extremity, left   . Blood transfusion     "I've had a bunch of them"  . Blood dyscrasia   . Legally blind   . Sickle cell disease, type SS ([place])   . [id number])     "probably weekly" (01/13/2014)  . Gout   . Chronic kidney disease (CKD), stage III (moderate)   . CHF (congestive heart failure) ([place])     followed by hf clinic   Past Surgical History  Procedure Laterality Date  . Cardiac catheterization  05/2011  . Bascilic vein transposition Right 04/12/2014    Procedure: BASILIC VEIN TRANSPOSITION;  Surgeon: Elam Dutch, MD;  Location: [place];  Service: Vascular;  Laterality: Right;  . Left and right heart catheterization with coronary angiogram N/A 06/11/2011    Procedure: LEFT AND RIGHT HEART CATHETERIZATION WITH CORONARY ANGIOGRAM;  Surgeon: Larey Dresser, MD;  Location: [facility] CATH LAB;  Service: Cardiovascular;  Laterality: N/A;   Family History  Problem Relation Age  of Onset  . Alcohol abuse Mother   . Liver disease Mother   . Cirrhosis Mother   . Heart attack Mother    Social History  Substance Use Topics  . Smoking status: Current Every Day Smoker -- 0.25 packs/day for 41 years    Types: Cigarettes  . Smokeless tobacco: Never Used  . Alcohol Use: 1.8 oz/week    1 Glasses of wine, 2 Cans of beer per week     Comment: occasional    Review of Systems  Constitutional: Negative for fever.  Respiratory: Positive for shortness of breath.   All other systems reviewed and are negative.     Allergies  Review of patient's allergies indicates no known allergies.  Home Medications   Prior to Admission medications   Medication Sig Start Date End Date Taking? Authorizing Provider  allopurinol (ZYLOPRIM) 100 MG tablet Take 1 tablet (100 mg total) by mouth daily. 08/11/15   Micheline Chapman, NP  aspirin EC 81 MG tablet Take 81 mg by mouth daily.    Historical Provider, MD  benzonatate (TESSALON) 100 MG capsule Take 1 capsule (100 mg total) by mouth 3 (three) times daily as needed for cough. 10/25/15   [name], MD  calcitRIOL (ROCALTROL) 0.25 MCG capsule Take 0.25 mcg by mouth daily.     Historical Provider, MD  carvedilol (COREG) 25 MG tablet TAKE 1 TABLET (25 MG TOTAL) BY MOUTH 2 (  TWO) TIMES DAILY WITH A MEAL. 07/04/15   Jolaine Artist, MD  folic acid (FOLVITE) 1 MG tablet TAKE 1 TABLET (1 MG TOTAL) BY MOUTH DAILY. 08/11/15   Micheline Chapman, NP  furosemide (LASIX) 40 MG tablet TAKE 1&1/2 TABLET ONCE DAILY 08/29/15   Jolaine Artist, MD  guaiFENesin-dextromethorphan [facility] DM) 100-10 MG/5ML syrup Take 5 mLs by mouth every 4 (four) hours as needed for cough. 10/25/15   [name], MD  hydrALAZINE (APRESOLINE) 50 MG tablet TAKE 1.5 TABLETS (75 MG TOTAL) BY MOUTH 3 (THREE) TIMES DAILY. 05/10/15   Shaune Pascal Bensimhon, MD  hydroxyurea (HYDREA) 500 MG capsule TAKE ONE CAPSULE BY MOUTH DAILY . MAY TAKE WITH FOOD TO MINIMIZE GI SIDE  EFFECTS Patient not taking: Reported on 10/25/2015 03/07/15   Micheline Chapman, NP  isosorbide mononitrate (IMDUR) 60 MG 24 hr tablet TAKE 1 TABLET BY MOUTH EVERY DAY 12/16/15   Jolaine Artist, MD  metolazone (ZAROXOLYN) 2.5 MG tablet TAKE 1 TABLET BY MOUTH AS NEEDED 05/31/15   Jolaine Artist, MD  oxyCODONE (ROXICODONE) 5 MG immediate release tablet Take 1 tablet (5 mg total) by mouth every 4 (four) hours as needed for moderate pain or severe pain. 10/25/15   [name], MD  polyethylene glycol powder (GLYCOLAX/MIRALAX) powder Take 17 g by mouth daily. 02/17/15   Micheline Chapman, NP   BP 126/87 mmHg  Pulse 85  Temp(Src) 97.8 F (36.6 C) (Oral)  Resp 18  SpO2 100% Physical Exam  Constitutional: He is oriented to person, place, and time. He appears well-developed and well-nourished.  HENT:  Head: Normocephalic.  Right Ear: External ear normal.  Left Ear: External ear normal.  Nose: Nose normal.  Mouth/Throat: Oropharynx is clear and moist.  Eyes: Conjunctivae and EOM are normal. Pupils are equal, round, and reactive to light.  Neck: Normal range of motion. Neck supple.  Cardiovascular: Normal rate and normal heart sounds.   Pulmonary/Chest: Effort normal and breath sounds normal.  Abdominal: Soft.  Musculoskeletal: Normal range of motion.  Neurological: He is alert and oriented to person, place, and time.  Skin: Skin is warm.  Psychiatric: He has a normal mood and affect.  Nursing note and vitals reviewed.   ED Course  Procedures (including critical care time) Labs Review Labs Reviewed  BASIC METABOLIC PANEL - Abnormal; Notable for the following:    CO2 21 (*)    BUN 98 (*)    Creatinine, Ser 4.09 (*)    Calcium 8.8 (*)    GFR calc non Af Amer 15 (*)    GFR calc Af Amer 17 (*)    All other components within normal limits  CBC  I-STAT TROPOININ, ED    Imaging Review Dg Chest 2 View  12/22/2015  CLINICAL DATA:  Short of breath. Sickle cell. History of  hypertension, kidney failure, CHF EXAM: CHEST  2 VIEW COMPARISON:  01/13/2014 FINDINGS: COPD with pulmonary hyperinflation Cardiac enlargement. Pulmonary venous congestion. Small pleural effusions consistent with fluid overload. Slight interstitial edema. Negative for pneumonia.  No mass lesion. IMPRESSION: COPD Mild fluid overload with early interstitial edema and small pleural effusions. Electronically Signed   By: Franchot Gallo M.D.   On: 12/22/2015 11:46   I have personally reviewed and evaluated these images and lab results as part of my medical decision-making.   EKG Interpretation   Date/Time:  Thursday December 22 2015 11:14:24 EDT Ventricular Rate:  85 PR Interval:  QRS Duration: 90 QT Interval:  410 QTC Calculation: 487 R Axis:   75 Text Interpretation:  Atrial fibrillation Anterior infarct , age  undetermined Abnormal ECG Confirmed by DELO  MD, DOUGLAS ([id number]) on  12/22/2015 1:03:45 PM      MDM  I spoke to Dr. Lorrene Reid who advised pt had a creat of 2.86 in February, 3.33 in June. Pt is 4.06 today. She does not feel pt currently needs dialysis.  Pt can be diuresed.  I spoke to Dr. Zigmund Daniel.  We will transfer pt to [facility] for transfusion and diuresis.    Final diagnoses:  Other iron deficiency anemias  Acute on chronic diastolic congestive heart failure [facility])  Renal failure    Tranfer to [employer], PA-C 12/22/15 1454  Veryl Speak, MD 12/22/15 1556

## 2015-12-22 NOTE — Consult Note (Signed)
Renal Consultation Requesting Physician:  Dr. Zigmund Daniel Primary Nephrologist: Lorrene Reid Reason for Consult:  Renal failure  HPI: The patient is a 60 y.o. year-old AAM well known to me with sickle cell disease, gout, HTN, systolic heart failure (NICM; last ECHO 2015 EF 40%) and CKD4 that has been progressive. He was last seen in the office 11/17/15 and at that time felt well, had no SOB or edema, weight was 149 lb and stable on our scales, creatinine 3.33. Had Hb of 6 and required transfusion of 1 unit (his primary care had stopped his hydroxyurea back in February - up until that time he was maintaining Hb around 8). He has a RUE BC AVF that was created 04/12/14, is on calcitriol for secondary HPT, and as of June 1 was feeling well.  Reports over the past 3 weeks worsening abdominal distension, DOE, having to sleep on his couch so he could sit up easily to catch his breath. Noted some leg edema which was new. Has had some proximal leg/thigh pain. He had been taking his 40 mg lasix only once a day - so for the last week or so took twice a day with no relief.  Presented to the ED with these complaints. Was found to be in atrial fibrillation(new), CXR with pleural effusions and interstitial edema, creatinine up to 4. Admitted for for further evaluation.   Denies nausea, vomiting, anorexia, change in UOP.  Creatinine summary for reference is as follows:       12/22/2015 4.09    11/17/2015 3.33/BUN 88   (CKA)    08/11/2015  2.90* 0.70 - 1.33 mg/dL Final  07/20/2015 2.86 (CKA)    02/17/2015  2.32* 0.70 - 1.33 mg/dL Final  08/30/2014  2.48* 0.50 - 1.35 mg/dL Final  05/26/2014  2.77* 0.50 - 1.35 mg/dL Final  02/08/2014  2.75* 0.50 - 1.35 mg/dL Final  09/21/2013 1 2.44* 0.50 - 1.35 mg/dL Final    Past Medical History  Diagnosis Date  . Swelling of extremity, right   . Swelling abdomen   . Swelling of extremity, left   . Blood transfusion     "I've had a bunch of them"  . Blood dyscrasia   . Legally  blind   . Sickle cell disease, type SS ([place])   . MOQHUTML(465.0)     "probably weekly" (01/13/2014)  . Gout   . Chronic kidney disease (CKD), stage III (moderate)   . CHF (congestive heart failure) ([place])     followed by hf clinic  . Shortness of breath dyspnea      Past Surgical History  Procedure Laterality Date  . Cardiac catheterization  05/2011  . Bascilic vein transposition Right 04/12/2014    Procedure: BASILIC VEIN TRANSPOSITION;  Surgeon: Elam Dutch, MD;  Location: [place];  Service: Vascular;  Laterality: Right;  . Left and right heart catheterization with coronary angiogram N/A 06/11/2011    Procedure: LEFT AND RIGHT HEART CATHETERIZATION WITH CORONARY ANGIOGRAM;  Surgeon: Larey Dresser, MD;  Location: [facility] CATH LAB;  Service: Cardiovascular;  Laterality: N/A;     Family History  Problem Relation Age of Onset  . Alcohol abuse Mother   . Liver disease Mother   . Cirrhosis Mother   . Heart attack Mother    Social History:  reports that he quit smoking about 2 weeks ago. His smoking use included Cigarettes. He has a 10.25 pack-year smoking history. He has never used smokeless tobacco. He reports that he drinks about 1.8  oz of alcohol per week. He reports that he does not use illicit drugs.  Allergies: No Known Allergies  Home medications: Prior to Admission medications   Medication Sig Start Date End Date Taking? Authorizing Provider  allopurinol (ZYLOPRIM) 100 MG tablet Take 1 tablet (100 mg total) by mouth daily. 08/11/15  Yes Micheline Chapman, NP  aspirin EC 81 MG tablet Take 81 mg by mouth daily.   Yes Historical Provider, MD  calcitRIOL (ROCALTROL) 0.25 MCG capsule Take 0.25 mcg by mouth daily.    Yes Historical Provider, MD  carvedilol (COREG) 25 MG tablet TAKE 1 TABLET (25 MG TOTAL) BY MOUTH 2 (TWO) TIMES DAILY WITH A MEAL. 07/04/15  Yes Jolaine Artist, MD  folic acid (FOLVITE) 1 MG tablet TAKE 1 TABLET (1 MG TOTAL) BY MOUTH DAILY. 08/11/15  Yes Micheline Chapman, NP  furosemide (LASIX) 40 MG tablet TAKE 1&1/2 TABLET ONCE DAILY 08/29/15  Yes Jolaine Artist, MD  guaiFENesin-dextromethorphan (ROBITUSSIN DM) 100-10 MG/5ML syrup Take 5 mLs by mouth every 4 (four) hours as needed for cough. 10/25/15  Yes [name], MD  hydrALAZINE (APRESOLINE) 50 MG tablet TAKE 1.5 TABLETS (75 MG TOTAL) BY MOUTH 3 (THREE) TIMES DAILY. 05/10/15  Yes Jolaine Artist, MD  isosorbide mononitrate (IMDUR) 60 MG 24 hr tablet TAKE 1 TABLET BY MOUTH EVERY DAY 12/16/15  Yes Jolaine Artist, MD  metolazone (ZAROXOLYN) 2.5 MG tablet TAKE 1 TABLET BY MOUTH AS NEEDED 05/31/15  Yes Jolaine Artist, MD  oxyCODONE (ROXICODONE) 5 MG immediate release tablet Take 1 tablet (5 mg total) by mouth every 4 (four) hours as needed for moderate pain or severe pain. 10/25/15  Yes [name], MD  polyethylene glycol powder (GLYCOLAX/MIRALAX) powder Take 17 g by mouth daily. 02/17/15  Yes Micheline Chapman, NP  benzonatate (TESSALON) 100 MG capsule Take 1 capsule (100 mg total) by mouth 3 (three) times daily as needed for cough. Patient not taking: Reported on 12/22/2015 10/25/15   [name], MD  hydroxyurea (HYDREA) 500 MG capsule TAKE ONE CAPSULE BY MOUTH DAILY . MAY TAKE WITH FOOD TO MINIMIZE GI SIDE EFFECTS Patient not taking: Reported on 10/25/2015 03/07/15   Micheline Chapman, NP    Inpatient medications: . aspirin EC  81 mg Oral Daily  . calcitRIOL  0.25 mcg Oral Daily  . carvedilol  25 mg Oral BID WC  . folic acid  1 mg Oral Daily  . furosemide  120 mg Intravenous Q12H  . furosemide  40 mg Intravenous Q12H  . heparin  5,000 Units Subcutaneous Q8H  . hydrALAZINE  75 mg Oral Q8H  . isosorbide mononitrate  60 mg Oral Daily  . metolazone  2.5 mg Oral Daily  . polyethylene glycol  17 g Oral Daily    Review of Systems See HPI  Physical Exam:  Blood pressure 126/77, pulse 88, temperature 97.7 F (36.5 C), temperature source Oral, resp. rate 20, height 5'  11" (1.803 m), weight 67.132 kg (148 lb), SpO2 92 %.  Gen: Very nice light skinned AAM Mildly dyspneic Skin: no rash, cyanosis +JVD to jaw angle Diminished BS with crackles bases Irreg irreg Very dynamic precordium S1S2 No S3 1/6 murmur LSB Abdomen quite protuberant ? Fluid wave Liver edge down No focal abd tenderness 1+ edema both LE's  Labs: Basic Metabolic Panel:  Recent Labs Lab 12/22/15 1233  NA 138  K 4.5  CL 109  CO2 21*  GLUCOSE 97  BUN 98*  CREATININE 4.09*  CALCIUM 8.8*     Recent Labs Lab 12/22/15 1233  WBC 8.3  HGB 6.0*  HCT 16.7*  MCV 89.3  PLT 130*    Xrays/Other Studies: Dg Chest 2 View  12/22/2015  CLINICAL DATA:  Short of breath. Sickle cell. History of hypertension, kidney failure, CHF EXAM: CHEST  2 VIEW COMPARISON:  01/13/2014 FINDINGS: COPD with pulmonary hyperinflation Cardiac enlargement. Pulmonary venous congestion. Small pleural effusions consistent with fluid overload. Slight interstitial edema. Negative for pneumonia.  No mass lesion. IMPRESSION: COPD Mild fluid overload with early interstitial edema and small pleural effusions. Electronically Signed   By: Franchot Gallo M.D.   On: 12/22/2015 11:46   Dg Abd 1 View  12/22/2015  CLINICAL DATA:  Abdominal swelling for 3 weeks with pain for 4 days. Constipation and shortness of breath. Chronic kidney disease. Sickle cell disease. EXAM: ABDOMEN - 1 VIEW COMPARISON:  02/07/2013 ultrasound.  CT 02/07/2013 FINDINGS: Single supine view abdomen and pelvis. A gallstone measures 1.8 cm. Non-obstructive bowel gas pattern. No calcific densities over the kidneys. No appendicolith. Probable phleboliths in the left hemipelvis. Probable renal osteodystrophy, with heterogeneous increased marrow density throughout. IMPRESSION: No evidence of bowel obstruction or free intraperitoneal air. Cholelithiasis. Electronically Signed   By: Abigail Miyamoto M.D.   On: 12/22/2015 16:19   Background 60 y.o. year-old AAM  w/SSA, gout, HTN, systolic heart failure (NICM; last ECHO 2015 EF 40%) and progressive CKD4. Was last seen in my office 11/17/15 and at that time felt well, had no SOB or edema, weight was 149 lb and stable on our scales, creatinine 3.33. Had Hb of 6 and required transfusion of 1 unit (his primary care had stopped his hydroxyurea back in February - up until that time he was maintaining Hb around 8). He has a RUE BC AVF that was created 04/12/14, is on calcitriol for secondary HPT.  Reports over the past 3 weeks worsening abdominal distension, DOE, new leg edema, sx no better when he doubled his lasix to 40 BID for a week, Presented to the ED - as found to be in atrial fibrillation(new), CXR with pleural effusions and interstitial edema, creatinine up to 4. Admitted for for further evaluation.   Assessment/Recommendations  1. NICM with acute on chronic systolic CHF - new edema and abd distension past 3 weeks, CXR wet, new atrial fib. Historically his first sx of CHF are generally abd distension and bloating 1. Stop IVF's 2. Lasix 120 IV Q12H and assess response, alter dose up or down depending on diuresis. 3. Primary team has started metolazone - OK 4. If fails to diurese has mature AVF and could be dialyzed 5. Agree with Cardiology evaluation - echo/AF evaluation 2. CKD4 with worsening renal function - creatinine 3.33 on 11/17/15 now up to 4 with egfr 17. Unclear if heart failure causing worsening renal function, or if progressive renal failure has led to fluid retention/CHF. Either way needs diuresis, and may require dialysis. 1. Has patent and matured RUE BVT AVF should HD be required 3. New onset afib - was in sinus when I saw him on the office on 11/17/15.  1. Per Cards. Rate currently controlled. May have been heart failure trigger. 4. Profound anemia - up until 07/2015 on hydroxyurea (stopped by his primary care) and maintaining goal Hb of 8 (as determined by Dr. Zigmund Daniel when she was following him in  [facility]); he has received a single dose of Aranesp back in 2015  but nothing chronically) 5. Secondary HPT  1. continue calcitriol. Last PTH 120. 6. Gout - stable on allopurinol as outpt, Primary team is currently holding. 7. Legal blindness from macular degeneration  Thanks for the consult. Will follow.  Jamal Maes,  MD [facility] [phone] pager 12/22/2015, 7:25 PM

## 2015-12-23 ENCOUNTER — Inpatient Hospital Stay (HOSPITAL_COMMUNITY): Payer: PPO

## 2015-12-23 DIAGNOSIS — N184 Chronic kidney disease, stage 4 (severe): Secondary | ICD-10-CM | POA: Diagnosis not present

## 2015-12-23 DIAGNOSIS — I130 Hypertensive heart and chronic kidney disease with heart failure and stage 1 through stage 4 chronic kidney disease, or unspecified chronic kidney disease: Secondary | ICD-10-CM | POA: Diagnosis not present

## 2015-12-23 DIAGNOSIS — I480 Paroxysmal atrial fibrillation: Secondary | ICD-10-CM | POA: Diagnosis not present

## 2015-12-23 DIAGNOSIS — I5022 Chronic systolic (congestive) heart failure: Secondary | ICD-10-CM | POA: Diagnosis not present

## 2015-12-23 DIAGNOSIS — R0689 Other abnormalities of breathing: Secondary | ICD-10-CM | POA: Diagnosis not present

## 2015-12-23 DIAGNOSIS — I420 Dilated cardiomyopathy: Secondary | ICD-10-CM | POA: Diagnosis not present

## 2015-12-23 DIAGNOSIS — E211 Secondary hyperparathyroidism, not elsewhere classified: Secondary | ICD-10-CM | POA: Diagnosis not present

## 2015-12-23 DIAGNOSIS — D631 Anemia in chronic kidney disease: Secondary | ICD-10-CM | POA: Diagnosis not present

## 2015-12-23 DIAGNOSIS — I5023 Acute on chronic systolic (congestive) heart failure: Secondary | ICD-10-CM | POA: Diagnosis not present

## 2015-12-23 DIAGNOSIS — N179 Acute kidney failure, unspecified: Secondary | ICD-10-CM | POA: Diagnosis not present

## 2015-12-23 LAB — ECHOCARDIOGRAM COMPLETE
Height: 71 in
Weight: 2339.2 oz

## 2015-12-23 LAB — RENAL FUNCTION PANEL
Albumin: 3.4 g/dL — ABNORMAL LOW (ref 3.5–5.0)
Anion gap: 9 (ref 5–15)
BUN: 99 mg/dL — ABNORMAL HIGH (ref 6–20)
CO2: 19 mmol/L — ABNORMAL LOW (ref 22–32)
Calcium: 8.4 mg/dL — ABNORMAL LOW (ref 8.9–10.3)
Chloride: 106 mmol/L (ref 101–111)
Creatinine, Ser: 4.2 mg/dL — ABNORMAL HIGH (ref 0.61–1.24)
GFR calc Af Amer: 17 mL/min — ABNORMAL LOW (ref >60)
GFR calc non Af Amer: 14 mL/min — ABNORMAL LOW (ref >60)
Glucose, Bld: 116 mg/dL — ABNORMAL HIGH (ref 65–99)
Phosphorus: 6.1 mg/dL — ABNORMAL HIGH (ref 2.5–4.6)
Potassium: 4.3 mmol/L (ref 3.5–5.1)
Sodium: 134 mmol/L — ABNORMAL LOW (ref 135–145)

## 2015-12-23 LAB — CBC
HCT: 17.3 % — ABNORMAL LOW (ref 39.0–52.0)
Hemoglobin: 6.2 g/dL — CL (ref 13.0–17.0)
MCH: 31.8 pg (ref 26.0–34.0)
MCHC: 35.8 g/dL (ref 30.0–36.0)
MCV: 88.7 fL (ref 78.0–100.0)
Platelets: 118 10*3/uL — ABNORMAL LOW (ref 150–400)
RBC: 1.95 MIL/uL — ABNORMAL LOW (ref 4.22–5.81)
RDW: 23.1 % — ABNORMAL HIGH (ref 11.5–15.5)
WBC: 6.4 10*3/uL (ref 4.0–10.5)

## 2015-12-23 LAB — PREPARE RBC (CROSSMATCH)

## 2015-12-23 MED ORDER — CARVEDILOL 12.5 MG PO TABS
12.5000 mg | ORAL_TABLET | Freq: Two times a day (BID) | ORAL | Status: DC
  Administered 2015-12-24 – 2015-12-26 (×6): 12.5 mg via ORAL
  Filled 2015-12-23 (×6): qty 1

## 2015-12-23 MED ORDER — WARFARIN - PHARMACIST DOSING INPATIENT
Freq: Every day | Status: DC
  Administered 2015-12-24 – 2015-12-26 (×3)

## 2015-12-23 MED ORDER — SODIUM CHLORIDE 0.9 % IV SOLN: Freq: Once | INTRAVENOUS | Status: DC

## 2015-12-23 MED ORDER — COUMADIN BOOK
Freq: Once | Status: DC
  Filled 2015-12-23: qty 1

## 2015-12-23 MED ORDER — WARFARIN SODIUM 5 MG PO TABS
5.0000 mg | ORAL_TABLET | Freq: Every day | ORAL | Status: DC
  Administered 2015-12-24 – 2015-12-26 (×3): 5 mg via ORAL
  Filled 2015-12-23 (×3): qty 1

## 2015-12-23 MED ORDER — WARFARIN VIDEO: Freq: Once | Status: DC

## 2015-12-23 NOTE — Care Management Important Message (Signed)
Important Message  Patient Details  Name: Patrick [name] MRN: [id number] Date of Birth: December 01, 1955   Medicare Important Message Given:  Yes    Loann Quill 12/23/2015, 10:36 AM

## 2015-12-23 NOTE — Progress Notes (Signed)
Advanced Heart Failure Rounding Note   Subjective:    Admitted with increased dyspnea.  New onset A fib, volume overload, and anemia. Received 1UPRBCs.  Nephrology consulted and started high dose lasix.    Mild dyspnea at rest.  + Orthopnea.    Objective:   Weight Range:  Vital Signs:   Temp:  [97.5 F (36.4 C)-98.1 F (36.7 C)] 97.8 F (36.6 C) (07/07 0526) Pulse Rate:  [66-97] 75 (07/07 0526) Resp:  [12-20] 20 (07/07 0526) BP: (98-138)/(51-88) 107/52 mmHg (07/07 0526) SpO2:  [90 %-100 %] 95 % (07/07 0526) Weight:  [146 lb 3.2 oz (66.316 kg)-148 lb (67.132 kg)] 146 lb 3.2 oz (66.316 kg) (07/07 0526) Last BM Date: 12/22/15  Weight change: Filed Weights   12/22/15 1637 12/23/15 0526  Weight: 148 lb (67.132 kg) 146 lb 3.2 oz (66.316 kg)    Intake/Output:   Intake/Output Summary (Last 24 hours) at 12/23/15 0903 Last data filed at 12/23/15 0329  Gross per 24 hour  Intake    335 ml  Output    960 ml  Net   -625 ml     Physical Exam: General:  Appears fatigued. Mild dyspnea at rest HEENT: normal Neck: supple. JVP to ear . Carotids 2+ bilat; no bruits. No lymphadenopathy or thryomegaly appreciated. Cor: PMI nondisplaced. Irregular rate & rhythm. 2/6 TR Lungs: clear Abdomen: soft, nontender, ++ distended. No hepatosplenomegaly. No bruits or masses. Good bowel sounds. Extremities: no cyanosis, clubbing, rash, edema. RUE fistula  Neuro: alert & orientedx3, cranial nerves grossly intact. moves all 4 extremities w/o difficulty. Affect pleasant  Telemetry: A fib 80s   Labs: Basic Metabolic Panel:  Recent Labs Lab 12/22/15 1233 12/23/15 0518  NA 138 134*  K 4.5 4.3  CL 109 106  CO2 21* 19*  GLUCOSE 97 116*  BUN 98* 99*  CREATININE 4.09* 4.20*  CALCIUM 8.8* 8.4*  PHOS  --  6.1*    Liver Function Tests:  Recent Labs Lab 12/23/15 0518  ALBUMIN 3.4*   No results for input(s): LIPASE, AMYLASE in the last 168 hours. No results for input(s): AMMONIA  in the last 168 hours.  CBC:  Recent Labs Lab 12/22/15 1233  WBC 8.3  HGB 6.0*  HCT 16.7*  MCV 89.3  PLT 130*    Cardiac Enzymes: No results for input(s): CKTOTAL, CKMB, CKMBINDEX, TROPONINI in the last 168 hours.  BNP: BNP (last 3 results)  Recent Labs  12/22/15 1838  BNP 2524.5*    ProBNP (last 3 results) No results for input(s): PROBNP in the last 8760 hours.    Other results:  Imaging: Dg Chest 2 View  12/22/2015  CLINICAL DATA:  Short of breath. Sickle cell. History of hypertension, kidney failure, CHF EXAM: CHEST  2 VIEW COMPARISON:  01/13/2014 FINDINGS: COPD with pulmonary hyperinflation Cardiac enlargement. Pulmonary venous congestion. Small pleural effusions consistent with fluid overload. Slight interstitial edema. Negative for pneumonia.  No mass lesion. IMPRESSION: COPD Mild fluid overload with early interstitial edema and small pleural effusions. Electronically Signed   By: Franchot Gallo M.D.   On: 12/22/2015 11:46   Dg Abd 1 View  12/22/2015  CLINICAL DATA:  Abdominal swelling for 3 weeks with pain for 4 days. Constipation and shortness of breath. Chronic kidney disease. Sickle cell disease. EXAM: ABDOMEN - 1 VIEW COMPARISON:  02/07/2013 ultrasound.  CT 02/07/2013 FINDINGS: Single supine view abdomen and pelvis. A gallstone measures 1.8 cm. Non-obstructive bowel gas pattern. No calcific densities over the  kidneys. No appendicolith. Probable phleboliths in the left hemipelvis. Probable renal osteodystrophy, with heterogeneous increased marrow density throughout. IMPRESSION: No evidence of bowel obstruction or free intraperitoneal air. Cholelithiasis. Electronically Signed   By: Abigail Miyamoto M.D.   On: 12/22/2015 16:19      Medications:     Scheduled Medications: . aspirin EC  81 mg Oral Daily  . calcitRIOL  0.25 mcg Oral Daily  . carvedilol  25 mg Oral BID WC  . folic acid  1 mg Oral Daily  . furosemide  120 mg Intravenous Q12H  . heparin  5,000 Units  Subcutaneous Q8H  . hydrALAZINE  75 mg Oral Q8H  . isosorbide mononitrate  60 mg Oral Daily  . metolazone  2.5 mg Oral Daily  . polyethylene glycol  17 g Oral Daily     Infusions:     PRN Medications:  oxyCODONE, polyethylene glycol   Assessment:  1. A/C Systolic HF 2. A fib 3. Anemia 4. CKD Stage IV 5. Former Smoker  6. Legally Blind    Plan/Discussion:    Volume status elevated. Receiving high dose lasix + metolaozone per nephrology. Weight down 2 pounds.   New onset A fib. Not sure duration. Rate controlled. Unable to anticoagulate with anemia. Hgb 6 on admit. Received 1UPRBCs. Repeat CBC pending. Would need to use coumadin if we are able to anticoagulate with possible HD down the road.   Plan to repeat ECHO. For now continue current dose of carvedilol, hydralazine, imdur .    Dr Haroldine Laws will follow up with Sickle Cell MD.    Length of Stay: [place] NP-C  12/23/2015, 9:03 AM  Advanced Heart Failure Team Pager [phone] (M-F; 7a - 4p)  Please contact [facility] for night-coverage after hours (4p -7a ) and weekends on [email or web address]  Patient seen and examined with [name], NP. We discussed all aspects of the encounter. I agree with the assessment and plan as stated above.   Echo reviewed personally. EF 40% with moderate PAH. There is mitral annular calcification. Remains volume overloaded on exam with significant abdominal distension. Renal function worse. Mild diuresis with high-dose lasix. (Renal managing diuretics).   AF is new. Possibly may have triggered his decompensation. Rate controlled.  He will need anticoagulation with warfarin. I discussed this with Dr. Zigmund Dianne Bady who felt he would tolerate. Will start warfarin and plan DC-CV after 4 weeks of therapeutic INR. Will cut carvedilol to 12.5 bid to try and help with renal perfusion.   Amardeep Beckers,MD 6:01 PM

## 2015-12-23 NOTE — Care Management Note (Addendum)
Case Management Note  Patient Details  Name: Patrick [name] MRN: [id number] Date of Birth: 1955-07-05  Subjective/Objective:             Patient admitted from home for anemia. Lives alone, is independent. Secures transportation from family and friends.        Action/Plan:  CM will continue to follow for DC planning. Currently on O2, non dependent.   Addendum- DC to home, RA, apt made at coumadin clinic.   Expected Discharge Date:                  Expected Discharge Plan:  Home/Self Care  In-House Referral:     Discharge planning Services  CM Consult  Post Acute Care Choice:    Choice offered to:     DME Arranged:    DME Agency:     HH Arranged:    HH Agency:     Status of Service:  In process, will continue to follow  If discussed at Long Length of Stay Meetings, dates discussed:    Additional Comments:  Carles Collet, RN 12/23/2015, 2:15 PM

## 2015-12-23 NOTE — Progress Notes (Signed)
Subjective: Patient is a 60 year old gentleman admitted with shortness of breath and hypoxemia.  He has systolic dysfunction CHF as well as chronic kidney disease stage IV.  It appears he is having acute on chronic systolic failure.  Patient was doing much better overnight.  He has significant anemia so his shortness of breath may be secondary to demand.  He feels much better now after transfusion of packed red blood cells.  Hemoglobin rose from 6.0-6.2.  No pain no cough at this point.  Objective: Vital signs in last 24 hours: Temp:  [97.5 F (36.4 C)-98.1 F (36.7 C)] 97.8 F (36.6 C) (07/07 0526) Pulse Rate:  [66-97] 75 (07/07 0526) Resp:  [12-20] 20 (07/07 0526) BP: (98-138)/(51-88) 107/52 mmHg (07/07 0526) SpO2:  [90 %-100 %] 95 % (07/07 0526) Weight:  [66.316 kg (146 lb 3.2 oz)-67.132 kg (148 lb)] 66.316 kg (146 lb 3.2 oz) (07/07 0526) Weight change:  Last BM Date: 12/22/15  Intake/Output from previous day: 07/06 0701 - 07/07 0700 In: 335 [Blood:335] Out: 960 [Urine:960] Intake/Output this shift: Total I/O In: -  Out: 350 [Urine:350]  General appearance: alert, cooperative and no distress Head: Normocephalic, without obvious abnormality, atraumatic Back: symmetric, no curvature. ROM normal. No CVA tenderness. Resp: clear to auscultation bilaterally Chest wall: no tenderness Cardio: regular rate and rhythm, S1, S2 normal, no murmur, click, rub or gallop GI: soft, non-tender; bowel sounds normal; no masses,  no organomegaly Extremities: extremities normal, atraumatic, no cyanosis or edema Pulses: 2+ and symmetric Skin: Skin color, texture, turgor normal. No rashes or lesions Neurologic: Grossly normal  Lab Results:  Recent Labs  12/22/15 1233 12/23/15 0944  WBC 8.3 PENDING  HGB 6.0* 6.2*  HCT 16.7* 17.3*  PLT 130* PENDING   BMET  Recent Labs  12/22/15 1233 12/23/15 0518  NA 138 134*  K 4.5 4.3  CL 109 106  CO2 21* 19*  GLUCOSE 97 116*  BUN 98* 99*   CREATININE 4.09* 4.20*  CALCIUM 8.8* 8.4*    Studies/Results: Dg Chest 2 View  12/22/2015  CLINICAL DATA:  Short of breath. Sickle cell. History of hypertension, kidney failure, CHF EXAM: CHEST  2 VIEW COMPARISON:  01/13/2014 FINDINGS: COPD with pulmonary hyperinflation Cardiac enlargement. Pulmonary venous congestion. Small pleural effusions consistent with fluid overload. Slight interstitial edema. Negative for pneumonia.  No mass lesion. IMPRESSION: COPD Mild fluid overload with early interstitial edema and small pleural effusions. Electronically Signed   By: Franchot Gallo M.D.   On: 12/22/2015 11:46   Dg Abd 1 View  12/22/2015  CLINICAL DATA:  Abdominal swelling for 3 weeks with pain for 4 days. Constipation and shortness of breath. Chronic kidney disease. Sickle cell disease. EXAM: ABDOMEN - 1 VIEW COMPARISON:  02/07/2013 ultrasound.  CT 02/07/2013 FINDINGS: Single supine view abdomen and pelvis. A gallstone measures 1.8 cm. Non-obstructive bowel gas pattern. No calcific densities over the kidneys. No appendicolith. Probable phleboliths in the left hemipelvis. Probable renal osteodystrophy, with heterogeneous increased marrow density throughout. IMPRESSION: No evidence of bowel obstruction or free intraperitoneal air. Cholelithiasis. Electronically Signed   By: Abigail Miyamoto M.D.   On: 12/22/2015 16:19    Medications: I have reviewed the patient's current medications.  Assessment/Plan: 1. 1. Acute on Chronic Kidney Disease IV: Appreciate nephrology help.  Continue to monitor renal function after his current transfusion. Hold allopurinol.  Continue per nephrology recommendation. 2. Acute respiratory failure with hypoxia: Patient doing much better after gentle diuresis.  We will continue to observe and  consider therapeutic paracentesis if respiratory condition does not improve after transfusion. 3. Pulmonary Edema: Appears to have resolved at this point. 4. Anemia of chronic disease: Patient  has not had any significant rise in his hemoglobin.  I will transfuse 1 more unit of packed red blood cells.  5. Hb [place] without crisis: Pt's pain controlled. Will resume oral analgesics.  6. Acute on Chronic Systolic Heart failure: ECHO today is showing EF of 40%.  Cardiology has been consulted.  Appreciate their input.   LOS: 1 day   Jonaven Hilgers,LAWAL 12/23/2015, 10:52 AM

## 2015-12-23 NOTE — Progress Notes (Signed)
ANTICOAGULATION CONSULT NOTE - Initial Consult  Pharmacy Consult for Coumadin Indication: atrial fibrillation  No Known Allergies  Patient Measurements: Height: 5\' 11"  (180.3 cm) Weight: 146 lb 3.2 oz (66.316 kg) IBW/kg (Calculated) : 75.3   Vital Signs: Temp: 97.7 F (36.5 C) (07/07 1603) Temp Source: Oral (07/07 1603) BP: 99/58 mmHg (07/07 1603) Pulse Rate: 65 (07/07 1603)  Labs:  Recent Labs  12/22/15 1233 12/23/15 0518 12/23/15 0944  HGB 6.0*  --  6.2*  HCT 16.7*  --  17.3*  PLT 130*  --  118*  CREATININE 4.09* 4.20*  --     Estimated Creatinine Clearance: 18.2 mL/min (by C-G formula based on Cr of 4.2).   Medical History: Past Medical History  Diagnosis Date  . Swelling of extremity, right   . Swelling abdomen   . Swelling of extremity, left   . Blood transfusion     "I've had a bunch of them"  . Blood dyscrasia   . Legally blind   . Sickle cell disease, type SS ([place])   . [id number])     "probably weekly" (01/13/2014)  . Gout   . Chronic kidney disease (CKD), stage III (moderate)   . CHF (congestive heart failure) ([place])     followed by hf clinic  . Shortness of breath dyspnea     Assessment: 60 year old male beginning Coumadin for Afib HgB = 6.2, receiving blood  Goal of Therapy:  INR 2-3 Monitor platelets by anticoagulation protocol: Yes   Plan:  Coumadin 5 mg po daily at 1800 pm Daily INR Coumadin book and video  Thank you Anette Guarneri, PharmD [phone] 12/23/2015,6:04 PM

## 2015-12-23 NOTE — Progress Notes (Signed)
  Echocardiogram 2D Echocardiogram has been performed.  Patrick [name] 12/23/2015, 10:46 AM

## 2015-12-23 NOTE — Progress Notes (Signed)
Admit: 12/22/2015 LOS: 1  66M CKD4 (mature AVF, Dunham CKA, BL SCr 3.3) admith w/ AoC sCHF 2/2 NICM, AoC Anemia 2/2 SCA, New onset AFib  Subjective:  Weight down this AM UOP about 1L on 120 IV BID lasix + metolazone SCr stable Rec 1u PRBC AHF following  07/06 0701 - 07/07 0700 In: 335 [Blood:335] Out: 960 [Urine:960]  Filed Weights   12/22/15 1637 12/23/15 0526  Weight: 67.132 kg (148 lb) 66.316 kg (146 lb 3.2 oz)    Scheduled Meds: . aspirin EC  81 mg Oral Daily  . calcitRIOL  0.25 mcg Oral Daily  . carvedilol  25 mg Oral BID WC  . folic acid  1 mg Oral Daily  . furosemide  120 mg Intravenous Q12H  . heparin  5,000 Units Subcutaneous Q8H  . hydrALAZINE  75 mg Oral Q8H  . isosorbide mononitrate  60 mg Oral Daily  . metolazone  2.5 mg Oral Daily  . polyethylene glycol  17 g Oral Daily   Continuous Infusions:  PRN Meds:.oxyCODONE, polyethylene glycol  Current Labs: reviewed    Physical Exam:  Blood pressure 107/52, pulse 75, temperature 97.8 F (36.6 C), temperature source Oral, resp. rate 20, height 5\' 11"  (1.803 m), weight 66.316 kg (146 lb 3.2 oz), SpO2 95 %. NAD IRIR CTAB No LEE BLind in eyes S/nt/nd No c/c/e  A 1. AoCKD4 2/2 most likely to sHF exacerbation vs progressive CKD 1. Mature AVF RUE BVT 2. Dunham CKA, SCr was 3.3 11/2015 2. AoC sHF exacerbation, NICM: 1. Some response to BID IV lasix + metolazone 2. AHF following 3. TTE planned 3. New onset AFib, rate controlled, not yet on AC 4. Anemia / Sickle Cell Anemia 1. 1u pRBC overnight 2. Repeat Hb pending 5. Macular degeneration / blindness  P 1. Cont BID lasix and metolazone, might need to increase to TID Daily weights, Daily Renal Panel, Strict I/Os, Avoid nephrotoxins (NSAIDs, judicious IV Contrast)    Pearson Grippe MD 12/23/2015, 10:27 AM   Recent Labs Lab 12/22/15 1233 12/23/15 0518  NA 138 134*  K 4.5 4.3  CL 109 106  CO2 21* 19*  GLUCOSE 97 116*  BUN 98* 99*  CREATININE 4.09*  4.20*  CALCIUM 8.8* 8.4*  PHOS  --  6.1*    Recent Labs Lab 12/22/15 1233  WBC 8.3  HGB 6.0*  HCT 16.7*  MCV 89.3  PLT 130*

## 2015-12-24 ENCOUNTER — Inpatient Hospital Stay (HOSPITAL_COMMUNITY): Payer: PPO

## 2015-12-24 DIAGNOSIS — I420 Dilated cardiomyopathy: Secondary | ICD-10-CM | POA: Diagnosis not present

## 2015-12-24 DIAGNOSIS — D631 Anemia in chronic kidney disease: Secondary | ICD-10-CM | POA: Diagnosis not present

## 2015-12-24 DIAGNOSIS — R0602 Shortness of breath: Secondary | ICD-10-CM | POA: Diagnosis not present

## 2015-12-24 DIAGNOSIS — I5023 Acute on chronic systolic (congestive) heart failure: Secondary | ICD-10-CM | POA: Diagnosis not present

## 2015-12-24 DIAGNOSIS — E211 Secondary hyperparathyroidism, not elsewhere classified: Secondary | ICD-10-CM | POA: Diagnosis not present

## 2015-12-24 DIAGNOSIS — I480 Paroxysmal atrial fibrillation: Secondary | ICD-10-CM | POA: Diagnosis not present

## 2015-12-24 DIAGNOSIS — I130 Hypertensive heart and chronic kidney disease with heart failure and stage 1 through stage 4 chronic kidney disease, or unspecified chronic kidney disease: Secondary | ICD-10-CM | POA: Diagnosis not present

## 2015-12-24 DIAGNOSIS — I4821 Permanent atrial fibrillation: Secondary | ICD-10-CM | POA: Diagnosis not present

## 2015-12-24 DIAGNOSIS — I4891 Unspecified atrial fibrillation: Secondary | ICD-10-CM | POA: Diagnosis not present

## 2015-12-24 DIAGNOSIS — N184 Chronic kidney disease, stage 4 (severe): Secondary | ICD-10-CM | POA: Diagnosis not present

## 2015-12-24 DIAGNOSIS — I5022 Chronic systolic (congestive) heart failure: Secondary | ICD-10-CM | POA: Diagnosis not present

## 2015-12-24 LAB — RENAL FUNCTION PANEL
Albumin: 3.5 g/dL (ref 3.5–5.0)
Anion gap: 10 (ref 5–15)
BUN: 99 mg/dL — ABNORMAL HIGH (ref 6–20)
CO2: 22 mmol/L (ref 22–32)
Calcium: 8.8 mg/dL — ABNORMAL LOW (ref 8.9–10.3)
Chloride: 102 mmol/L (ref 101–111)
Creatinine, Ser: 4.09 mg/dL — ABNORMAL HIGH (ref 0.61–1.24)
GFR calc Af Amer: 17 mL/min — ABNORMAL LOW (ref >60)
GFR calc non Af Amer: 15 mL/min — ABNORMAL LOW (ref >60)
Glucose, Bld: 152 mg/dL — ABNORMAL HIGH (ref 65–99)
Phosphorus: 6.1 mg/dL — ABNORMAL HIGH (ref 2.5–4.6)
Potassium: 4 mmol/L (ref 3.5–5.1)
Sodium: 134 mmol/L — ABNORMAL LOW (ref 135–145)

## 2015-12-24 LAB — CBC WITH DIFFERENTIAL/PLATELET
Basophils Absolute: 0.1 10*3/uL (ref 0.0–0.1)
Basophils Relative: 1 %
Eosinophils Absolute: 0.3 10*3/uL (ref 0.0–0.7)
Eosinophils Relative: 4 %
HCT: 20.4 % — ABNORMAL LOW (ref 39.0–52.0)
Hemoglobin: 7.4 g/dL — ABNORMAL LOW (ref 13.0–17.0)
Lymphocytes Relative: 15 %
Lymphs Abs: 1 10*3/uL (ref 0.7–4.0)
MCH: 31.8 pg (ref 26.0–34.0)
MCHC: 36.3 g/dL — ABNORMAL HIGH (ref 30.0–36.0)
MCV: 87.6 fL (ref 78.0–100.0)
Monocytes Absolute: 0.9 10*3/uL (ref 0.1–1.0)
Monocytes Relative: 13 %
Neutro Abs: 4.4 10*3/uL (ref 1.7–7.7)
Neutrophils Relative %: 67 %
Platelets: 123 10*3/uL — ABNORMAL LOW (ref 150–400)
RBC: 2.33 MIL/uL — ABNORMAL LOW (ref 4.22–5.81)
RDW: 21 % — ABNORMAL HIGH (ref 11.5–15.5)
WBC: 6.7 10*3/uL (ref 4.0–10.5)

## 2015-12-24 LAB — PROTIME-INR
INR: 1.37 (ref 0.00–1.49)
Prothrombin Time: 16.9 seconds — ABNORMAL HIGH (ref 11.6–15.2)

## 2015-12-24 NOTE — Discharge Instructions (Signed)
Information on my [name] - Coumadin®   (Warfarin) ° °This medication education was reviewed with me or my healthcare representative as part of my discharge preparation.  The pharmacist that spoke with me during my hospital stay was:  Patrick [name], Patrick [name], RPH ° °Why was Coumadin prescribed for you? °Coumadin was prescribed for you because you have a blood clot or a medical condition that can cause an increased risk of forming blood clots. Blood clots can cause serious health problems by blocking the flow of blood to the heart, lung, or brain. Coumadin can prevent harmful blood clots from forming. °As a reminder your indication for Coumadin is:   Stroke Prevention Because Of Atrial Fibrillation ° °What test will check on my response to Coumadin? °While on Coumadin (warfarin) you will need to have an INR test regularly to ensure that your dose is keeping you in the desired range. The INR (international normalized ratio) number is calculated from the result of the laboratory test called prothrombin time (PT). ° °If an INR APPOINTMENT HAS NOT ALREADY BEEN MADE FOR YOU please schedule an appointment to have this lab work done by your health care provider within 7 days. °Your INR goal is usually a number between:  2 to 3 or your provider may give you a more narrow range like 2-2.5.  Ask your health care provider during an office visit what your goal INR is. ° °What  do you need to  know  About  COUMADIN? °Take Coumadin (warfarin) exactly as prescribed by your healthcare provider about the same time each day.  DO NOT stop taking without talking to the doctor who prescribed the medication.  Stopping without other blood clot prevention medication to take the place of Coumadin may increase your risk of developing a new clot or stroke.  Get refills before you run out. ° °What do you do if you miss a dose? °If you miss a dose, take it as soon as you remember on the same day then continue your regularly scheduled regimen  the next day.  Do not take two doses of Coumadin at the same time. ° °Important Safety Information °A possible side effect of Coumadin (Warfarin) is an increased risk of bleeding. You should call your healthcare provider right away if you experience any of the following: °? Bleeding from an injury or your nose that does not stop. °? Unusual colored urine (red or dark brown) or unusual colored stools (red or black). °? Unusual bruising for unknown reasons. °? A serious fall or if you hit your head (even if there is no bleeding). ° °Some foods or medicines interact with Coumadin® (warfarin) and might alter your response to warfarin. To help avoid this: °? Eat a balanced diet, maintaining a consistent amount of Vitamin K. °? Notify your provider about major diet changes you plan to make. °? Avoid alcohol or limit your intake to 1 drink for women and 2 drinks for men per day. °(1 drink is 5 oz. wine, 12 oz. beer, or 1.5 oz. liquor.) ° °Make sure that ANY health care provider who prescribes medication for you knows that you are taking Coumadin (warfarin).  Also make sure the healthcare provider who is monitoring your Coumadin knows when you have started a new medication including herbals and non-prescription products. ° °Coumadin® (Warfarin)  Major Drug Interactions  °Increased Warfarin Effect Decreased Warfarin Effect  °Alcohol (large quantities) °Antibiotics (esp. Septra/Bactrim, Flagyl, Cipro) °Amiodarone (Cordarone) °Aspirin (ASA) °Cimetidine (Tagamet) °Megestrol (Megace) °NSAIDs (ibuprofen,   naproxen, etc.) °Piroxicam (Feldene) °Propafenone (Rythmol SR) °Propranolol (Inderal) °Isoniazid (INH) °Posaconazole (Noxafil) Barbiturates (Phenobarbital) °Carbamazepine (Tegretol) °Chlordiazepoxide (Librium) °Cholestyramine (Questran) °Griseofulvin °Oral Contraceptives °Rifampin °Sucralfate (Carafate) °Vitamin K  ° °Coumadin® (Warfarin) Major Herbal Interactions  °Increased Warfarin Effect Decreased Warfarin Effect   °Garlic °Ginseng °Ginkgo biloba Coenzyme Q10 °Green tea °St. John’s wort   ° °Coumadin® (Warfarin) FOOD Interactions  °Eat a consistent number of servings per week of foods HIGH in Vitamin K °(1 serving = ½ cup)  °Collards (cooked, or boiled & drained) °Kale (cooked, or boiled & drained) °Mustard greens (cooked, or boiled & drained) °Parsley *serving size only = ¼ cup °Spinach (cooked, or boiled & drained) °Swiss chard (cooked, or boiled & drained) °Turnip greens (cooked, or boiled & drained)  °Eat a consistent number of servings per week of foods MEDIUM-HIGH in Vitamin K °(1 serving = 1 cup)  °Asparagus (cooked, or boiled & drained) °Broccoli (cooked, boiled & drained, or raw & chopped) °Brussel sprouts (cooked, or boiled & drained) *serving size only = ½ cup °Lettuce, raw (green leaf, endive, romaine) °Spinach, raw °Turnip greens, raw & chopped  ° °These websites have more information on Coumadin (warfarin):  www.coumadin.com; °www.ahrq.gov/consumer/coumadin.htm; ° ° °

## 2015-12-24 NOTE — Progress Notes (Signed)
Subjective: Patient is a 60 year old gentleman admitted with shortness of breath and hypoxemia.  He has systolic dysfunction CHF as well as chronic kidney disease stage IV.  It appears he is having acute on chronic systolic failure.  Patient was doing much better overnight.  He has significant anemia so his shortness of breath may be secondary to demand.  He feels much better now after transfusion of packed red blood cells.  Hemoglobin rose from 6.0-6.2.  No pain no cough at this point.  Objective: Vital signs in last 24 hours: Temp:  [97.5 F (36.4 C)-98.5 F (36.9 C)] 97.5 F (36.4 C) (07/08 1321) Pulse Rate:  [60-75] 73 (07/08 1321) Resp:  [16-20] 16 (07/08 1321) BP: (99-109)/(58-68) 102/60 mmHg (07/08 1321) SpO2:  [95 %-100 %] 95 % (07/08 1321) Weight:  [69.219 kg (152 lb 9.6 oz)] 69.219 kg (152 lb 9.6 oz) (07/08 0554) Weight change: 2.087 kg (4 lb 9.6 oz) Last BM Date: 12/22/15  Intake/Output from previous day: 07/07 0701 - 07/08 0700 In: 1255 [P.O.:920; Blood:335] Out: 2075 [Urine:2075] Intake/Output this shift: Total I/O In: 550 [P.O.:550] Out: 600 [Urine:600]  General appearance: alert, cooperative and no distress Head: Normocephalic, without obvious abnormality, atraumatic Back: symmetric, no curvature. ROM normal. No CVA tenderness. Resp: clear to auscultation bilaterally Chest wall: no tenderness Cardio: regular rate and rhythm, S1, S2 normal, no murmur, click, rub or gallop GI: soft, non-tender; bowel sounds normal; no masses,  no organomegaly Extremities: extremities normal, atraumatic, no cyanosis or edema Pulses: 2+ and symmetric Skin: Skin color, texture, turgor normal. No rashes or lesions Neurologic: Grossly normal  Lab Results:  Recent Labs  12/23/15 0944 12/24/15 0543  WBC 6.4 6.7  HGB 6.2* 7.4*  HCT 17.3* 20.4*  PLT 118* 123*   BMET  Recent Labs  12/23/15 0518 12/24/15 0940  NA 134* 134*  K 4.3 4.0  CL 106 102  CO2 19* 22  GLUCOSE 116*  152*  BUN 99* 99*  CREATININE 4.20* 4.09*  CALCIUM 8.4* 8.8*    Studies/Results: Dg Chest 2 View  12/24/2015  CLINICAL DATA:  Shortness of breath since yesterday, former smoker, recently stopped, history sickle cell disease, stage III chronic kidney disease, CHF EXAM: CHEST  2 VIEW COMPARISON:  With 12/22/2015 FINDINGS: Enlargement of cardiac silhouette with pulmonary vascular congestion. Mediastinal contours normal. Lungs emphysematous but clear. Tiny bibasilar effusions. No pneumothorax. IMPRESSION: Enlargement of cardiac silhouette with pulmonary vascular congestion. Emphysematous changes with tiny bibasilar effusions. Electronically Signed   By: Lavonia Dana M.D.   On: 12/24/2015 08:47   Dg Abd 1 View  12/22/2015  CLINICAL DATA:  Abdominal swelling for 3 weeks with pain for 4 days. Constipation and shortness of breath. Chronic kidney disease. Sickle cell disease. EXAM: ABDOMEN - 1 VIEW COMPARISON:  02/07/2013 ultrasound.  CT 02/07/2013 FINDINGS: Single supine view abdomen and pelvis. A gallstone measures 1.8 cm. Non-obstructive bowel gas pattern. No calcific densities over the kidneys. No appendicolith. Probable phleboliths in the left hemipelvis. Probable renal osteodystrophy, with heterogeneous increased marrow density throughout. IMPRESSION: No evidence of bowel obstruction or free intraperitoneal air. Cholelithiasis. Electronically Signed   By: Abigail Miyamoto M.D.   On: 12/22/2015 16:19    Medications: I have reviewed the patient's current medications.  Assessment/Plan: 1. 1. Acute on Chronic Kidney Disease IV: Appreciate nephrology help.  Creatinine has dropped from 4.20-4.09. Continue to monitor renal function and continue to hold allopurinol.  Continue per nephrology recommendation. 2. Acute respiratory failure with hypoxia: Patient doing  much better after gentle diuresis.  We will continue to observe and gentle diuresis. 3. Pulmonary Edema: Appears to have improved but still slightly fluid  overloaded with basal crackles.  We will continue per cardiology and nephrology 4. Anemia of chronic disease: Hemoglobin is now 7.4 after transfusion of second unit of packed red blood cell.  We will continue to monitor his H&H  5. Hb [place] without crisis: Pt's pain controlled. Will resume oral analgesics.  6. Acute on Chronic Systolic Heart failure: ECHO today is showing EF of 40%.  Cardiology has been consulted.  Appreciate their input. 7. Hyponatremia: Probably due to volume expansion.  Continue close monitoring. 8. New onset Afib: Patient's rate is now controlled.  Appreciate cardiology's input.  For DCCV in the next 4 weeks after being on anticoagulation.  Continue warfarin   LOS: 2 days   Patrick [name],Patrick [name] 12/24/2015, 2:56 PM

## 2015-12-24 NOTE — Progress Notes (Signed)
SUBJECTIVE:  Breathing improved  OBJECTIVE:   Vitals:   Filed Vitals:   12/23/15 2231 12/23/15 2252 12/24/15 0221 12/24/15 0554  BP: 99/61 100/68 106/63 109/62  Pulse: 69 67 73 75  Temp: 98.2 F (36.8 C) 98.2 F (36.8 C) 98.5 F (36.9 C) 98.5 F (36.9 C)  TempSrc:  Oral    Resp: 20 20 18 18   Height:      Weight:    152 lb 9.6 oz (69.219 kg)  SpO2: 99% 97% 100% 99%   I&O's:   Intake/Output Summary (Last 24 hours) at 12/24/15 1111 Last data filed at 12/24/15 1007  Gross per 24 hour  Intake   1265 ml  Output   2325 ml  Net  -1060 ml   TELEMETRY: Reviewed telemetry pt in NSR:     PHYSICAL EXAM General: Well developed, well nourished, in no acute distress Head: Eyes PERRLA, No xanthomas.   Normal cephalic and atramatic  Lungs:   Clear bilaterally to auscultation and percussion. Heart:   HRRR S1 S2 Pulses are 2+ & equal. Abdomen: Bowel sounds are positive, abdomen soft and non-tender without masses Msk:  Back normal, normal gait. Normal strength and tone for age. Extremities:   No clubbing, cyanosis or edema.  DP +1 Neuro: Alert and oriented X 3. Psych:  Good affect, responds appropriately   LABS: Basic Metabolic Panel:  Recent Labs  12/22/15 1233 12/23/15 0518  NA 138 134*  K 4.5 4.3  CL 109 106  CO2 21* 19*  GLUCOSE 97 116*  BUN 98* 99*  CREATININE 4.09* 4.20*  CALCIUM 8.8* 8.4*  PHOS  --  6.1*   Liver Function Tests:  Recent Labs  12/23/15 0518  ALBUMIN 3.4*   No results for input(s): LIPASE, AMYLASE in the last 72 hours. CBC:  Recent Labs  12/23/15 0944 12/24/15 0543  WBC 6.4 6.7  NEUTROABS  --  4.4  HGB 6.2* 7.4*  HCT 17.3* 20.4*  MCV 88.7 87.6  PLT 118* 123*   Cardiac Enzymes: No results for input(s): CKTOTAL, CKMB, CKMBINDEX, TROPONINI in the last 72 hours. BNP: Invalid input(s): POCBNP D-Dimer: No results for input(s): DDIMER in the last 72 hours. Hemoglobin A1C: No results for input(s): HGBA1C in the last 72  hours. Fasting Lipid Panel: No results for input(s): CHOL, HDL, LDLCALC, TRIG, CHOLHDL, LDLDIRECT in the last 72 hours. Thyroid Function Tests: No results for input(s): TSH, T4TOTAL, T3FREE, THYROIDAB in the last 72 hours.  Invalid input(s): FREET3 Anemia Panel:  Recent Labs  12/22/15 1354  RETICCTPCT 14.6*   Coag Panel:   Lab Results  Component Value Date   INR 1.37 12/24/2015   INR 1.27 01/17/2012   INR 1.29 10/17/2011    RADIOLOGY: Dg Chest 2 View  12/24/2015  CLINICAL DATA:  Shortness of breath since yesterday, former smoker, recently stopped, history sickle cell disease, stage III chronic kidney disease, CHF EXAM: CHEST  2 VIEW COMPARISON:  With 12/22/2015 FINDINGS: Enlargement of cardiac silhouette with pulmonary vascular congestion. Mediastinal contours normal. Lungs emphysematous but clear. Tiny bibasilar effusions. No pneumothorax. IMPRESSION: Enlargement of cardiac silhouette with pulmonary vascular congestion. Emphysematous changes with tiny bibasilar effusions. Electronically Signed   By: Lavonia Dana M.D.   On: 12/24/2015 08:47   Dg Chest 2 View  12/22/2015  CLINICAL DATA:  Short of breath. Sickle cell. History of hypertension, kidney failure, CHF EXAM: CHEST  2 VIEW COMPARISON:  01/13/2014 FINDINGS: COPD with pulmonary hyperinflation Cardiac enlargement. Pulmonary venous congestion. Small  pleural effusions consistent with fluid overload. Slight interstitial edema. Negative for pneumonia.  No mass lesion. IMPRESSION: COPD Mild fluid overload with early interstitial edema and small pleural effusions. Electronically Signed   By: Franchot Gallo M.D.   On: 12/22/2015 11:46   Dg Abd 1 View  12/22/2015  CLINICAL DATA:  Abdominal swelling for 3 weeks with pain for 4 days. Constipation and shortness of breath. Chronic kidney disease. Sickle cell disease. EXAM: ABDOMEN - 1 VIEW COMPARISON:  02/07/2013 ultrasound.  CT 02/07/2013 FINDINGS: Single supine view abdomen and pelvis. A gallstone  measures 1.8 cm. Non-obstructive bowel gas pattern. No calcific densities over the kidneys. No appendicolith. Probable phleboliths in the left hemipelvis. Probable renal osteodystrophy, with heterogeneous increased marrow density throughout. IMPRESSION: No evidence of bowel obstruction or free intraperitoneal air. Cholelithiasis. Electronically Signed   By: Abigail Miyamoto M.D.   On: 12/22/2015 16:19    Assessment/PLAN:  1. A/C Systolic HF - EF [id number] with moderate pulmonary HTN by echo.  Remains volume overloaded on exam and chest xray.  Renal managing diuertics.  On high dose lasix with only 1.7L neg neg since admit. Continue BB/Hydralazine and nitrates.   2. A fib - rate controlled. Now loading warfarin and plan for DCCV after 4 weeks of therapeutic INR.  Continue BB.   3. Anemia - Hbg down to 6.2 yesterday and now 7.4 after PRBCs. . 4. CKD Stage IV - creatinine trending upwards.  BMET pending this am.   5. Former Smoker  6. Legally Blind  .        Fransico Him, MD  12/24/2015  11:11 AM

## 2015-12-24 NOTE — Progress Notes (Signed)
Admit: 12/22/2015 LOS: 2  53M CKD4 (mature AVF, Dunham CKA, BL SCr 3.3) admith w/ AoC sCHF 2/2 NICM, AoC Anemia 2/2 SCA, New onset AFib  Subjective:  Good UOP, weights up? 1u PRBC overnight AM labs pending Says breathing is much better CXR with some vascular congestion  07/07 0701 - 07/08 0700 In: 1255 [P.O.:920; Blood:335] Out: 2075 [Urine:2075]  Filed Weights   12/22/15 1637 12/23/15 0526 12/24/15 0554  Weight: 67.132 kg (148 lb) 66.316 kg (146 lb 3.2 oz) 69.219 kg (152 lb 9.6 oz)    Scheduled Meds: . sodium chloride   Intravenous Once  . aspirin EC  81 mg Oral Daily  . calcitRIOL  0.25 mcg Oral Daily  . carvedilol  12.5 mg Oral BID WC  . coumadin book   Does not apply Once  . folic acid  1 mg Oral Daily  . furosemide  120 mg Intravenous Q12H  . heparin  5,000 Units Subcutaneous Q8H  . hydrALAZINE  75 mg Oral Q8H  . isosorbide mononitrate  60 mg Oral Daily  . metolazone  2.5 mg Oral Daily  . polyethylene glycol  17 g Oral Daily  . warfarin  5 mg Oral q1800  . warfarin   Does not apply Once  . Warfarin - Pharmacist Dosing Inpatient   Does not apply q1800   Continuous Infusions:  PRN Meds:.oxyCODONE, polyethylene glycol  Current Labs: reviewed    Physical Exam:  Blood pressure 109/62, pulse 75, temperature 98.5 F (36.9 C), temperature source Oral, resp. rate 18, height 5\' 11"  (1.803 m), weight 69.219 kg (152 lb 9.6 oz), SpO2 99 %. NAD IRIR CTAB No LEE BLind in eyes S/nt/nd No c/c/e RUE AVF +B/T, mature  A 1. AoCKD4 2/2 most likely to sHF exacerbation vs progressive CKD 1. Mature AVF RUE BVT 2. Dunham CKA, SCr was 3.3 11/2015 2. AoC sHF exacerbation, NICM: LVEF 40% 1. Some UOP response to BID IV lasix + metolazone 2. AHF following 3. New onset AFib, rate controlled, now on warfarin 4. Anemia / Sickle Cell Anemia 1. 1u pRBC overnight 5. Macular degeneration / blindness  P 1. Cont BID lasix and metolazone 2. Await AM labs 3. Daily weights, Daily Renal  Panel, Strict I/Os, Avoid nephrotoxins (NSAIDs, judicious IV Contrast)    Pearson Grippe MD 12/24/2015, 9:15 AM   Recent Labs Lab 12/22/15 1233 12/23/15 0518  NA 138 134*  K 4.5 4.3  CL 109 106  CO2 21* 19*  GLUCOSE 97 116*  BUN 98* 99*  CREATININE 4.09* 4.20*  CALCIUM 8.8* 8.4*  PHOS  --  6.1*    Recent Labs Lab 12/22/15 1233 12/23/15 0944 12/24/15 0543  WBC 8.3 6.4 6.7  NEUTROABS  --   --  4.4  HGB 6.0* 6.2* 7.4*  HCT 16.7* 17.3* 20.4*  MCV 89.3 88.7 87.6  PLT 130* 118* 123*

## 2015-12-24 NOTE — Progress Notes (Addendum)
ANTICOAGULATION CONSULT NOTE - Follow Up Consult  Pharmacy Consult for warfarin Indication: atrial fibrillation  No Known Allergies  Patient Measurements: Height: 5\' 11"  (180.3 cm) Weight: 152 lb 9.6 oz (69.219 kg) IBW/kg (Calculated) : 75.3  Vital Signs: Temp: 98.5 F (36.9 C) (07/08 0554) Temp Source: Oral (07/07 2252) BP: 109/62 mmHg (07/08 0554) Pulse Rate: 75 (07/08 0554)  Labs:  Recent Labs  12/22/15 1233 12/23/15 0518 12/23/15 0944 12/24/15 0543  HGB 6.0*  --  6.2* 7.4*  HCT 16.7*  --  17.3* 20.4*  PLT 130*  --  118* 123*  LABPROT  --   --   --  16.9*  INR  --   --   --  1.37  CREATININE 4.09* 4.20*  --   --     Estimated Creatinine Clearance: 19 mL/min (by C-G formula based on Cr of 4.2).   Assessment: 60 y/o male with sickle cell disease with new onset Afib. Pharmacy consulted to dose warfarin. INR is subtherapeutic today at 1.37 - appears dose ordered last night was not charted as given. Also on SQ heparin until INR at goal. No overt bleeding noted, Hgb is low at 7.4 but improved s/p PRBC, platelets are low stable.  Goal of Therapy:  INR 2-3 Monitor platelets by anticoagulation protocol: Yes   Plan:  - Warfarin 5 mg PO qpm - F/U d/c heparin SQ once INR >2 - INR daily - Monitor for s/sx of bleeding - Education prior to discharge   Patrick [name], PharmD, BCPS Clinical Pharmacist Pager: [phone] 12/24/2015 10:39 AM

## 2015-12-25 DIAGNOSIS — E211 Secondary hyperparathyroidism, not elsewhere classified: Secondary | ICD-10-CM | POA: Diagnosis not present

## 2015-12-25 DIAGNOSIS — I5023 Acute on chronic systolic (congestive) heart failure: Secondary | ICD-10-CM | POA: Diagnosis not present

## 2015-12-25 DIAGNOSIS — N184 Chronic kidney disease, stage 4 (severe): Secondary | ICD-10-CM | POA: Diagnosis not present

## 2015-12-25 DIAGNOSIS — I130 Hypertensive heart and chronic kidney disease with heart failure and stage 1 through stage 4 chronic kidney disease, or unspecified chronic kidney disease: Secondary | ICD-10-CM | POA: Diagnosis not present

## 2015-12-25 DIAGNOSIS — I420 Dilated cardiomyopathy: Secondary | ICD-10-CM | POA: Diagnosis not present

## 2015-12-25 DIAGNOSIS — D631 Anemia in chronic kidney disease: Secondary | ICD-10-CM | POA: Diagnosis not present

## 2015-12-25 DIAGNOSIS — I5022 Chronic systolic (congestive) heart failure: Secondary | ICD-10-CM | POA: Diagnosis not present

## 2015-12-25 LAB — PROTIME-INR
INR: 1.36 (ref 0.00–1.49)
Prothrombin Time: 16.9 seconds — ABNORMAL HIGH (ref 11.6–15.2)

## 2015-12-25 LAB — BASIC METABOLIC PANEL
Anion gap: 11 (ref 5–15)
BUN: 103 mg/dL — ABNORMAL HIGH (ref 6–20)
CO2: 23 mmol/L (ref 22–32)
Calcium: 9.1 mg/dL (ref 8.9–10.3)
Chloride: 101 mmol/L (ref 101–111)
Creatinine, Ser: 4.18 mg/dL — ABNORMAL HIGH (ref 0.61–1.24)
GFR calc Af Amer: 17 mL/min — ABNORMAL LOW (ref >60)
GFR calc non Af Amer: 14 mL/min — ABNORMAL LOW (ref >60)
Glucose, Bld: 145 mg/dL — ABNORMAL HIGH (ref 65–99)
Potassium: 4 mmol/L (ref 3.5–5.1)
Sodium: 135 mmol/L (ref 135–145)

## 2015-12-25 LAB — CBC
HCT: 21.9 % — ABNORMAL LOW (ref 39.0–52.0)
Hemoglobin: 7.6 g/dL — ABNORMAL LOW (ref 13.0–17.0)
MCH: 31.4 pg (ref 26.0–34.0)
MCHC: 34.7 g/dL (ref 30.0–36.0)
MCV: 90.5 fL (ref 78.0–100.0)
Platelets: 119 10*3/uL — ABNORMAL LOW (ref 150–400)
RBC: 2.42 MIL/uL — ABNORMAL LOW (ref 4.22–5.81)
RDW: 21.2 % — ABNORMAL HIGH (ref 11.5–15.5)
WBC: 6.2 10*3/uL (ref 4.0–10.5)

## 2015-12-25 MED ORDER — TORSEMIDE 20 MG PO TABS
100.0000 mg | ORAL_TABLET | Freq: Every day | ORAL | Status: DC
Start: 2015-12-26 — End: 2015-12-27
  Administered 2015-12-26 – 2015-12-27 (×2): 100 mg via ORAL
  Filled 2015-12-25 (×2): qty 5

## 2015-12-25 NOTE — Progress Notes (Signed)
Pharmacist Heart Failure Core Measure Documentation  Assessment: Patrick [name] has an EF documented as 40% on 12/23/15 by echo.  Rationale: Heart failure patients with left ventricular systolic dysfunction (LVSD) and an EF < 40% should be prescribed an angiotensin converting enzyme inhibitor (ACEI) or angiotensin receptor blocker (ARB) at discharge unless a contraindication is documented in the medical record.  This patient is not currently on an ACEI or ARB for HF.  This note is being placed in the record in order to provide documentation that a contraindication to the use of these agents is present for this encounter.  ACE Inhibitor or Angiotensin Receptor Blocker is contraindicated (specify all that apply)  []   ACEI allergy AND ARB allergy []   Angioedema []   Moderate or severe aortic stenosis []   Hyperkalemia []   Hypotension []   Renal artery stenosis [x]   Worsening renal function, preexisting renal disease or dysfunction   Renold Genta, PharmD, BCPS Clinical Pharmacist Pager: [phone] 12/25/2015 7:39 AM

## 2015-12-25 NOTE — Progress Notes (Signed)
ANTICOAGULATION CONSULT NOTE - Follow Up Consult  Pharmacy Consult for warfarin Indication: atrial fibrillation  No Known Allergies  Patient Measurements: Height: 5\' 11"  (180.3 cm) Weight: 145 lb 11.2 oz (66.089 kg) IBW/kg (Calculated) : 75.3  Vital Signs: Temp: 98.3 F (36.8 C) (07/09 0530) Temp Source: Oral (07/09 0530) BP: 111/59 mmHg (07/09 0555) Pulse Rate: 72 (07/09 0530)  Labs:  Recent Labs  12/23/15 0518 12/23/15 0944 12/24/15 0543 12/24/15 0940 12/25/15 0542 12/25/15 0750  HGB  --  6.2* 7.4*  --   --  7.6*  HCT  --  17.3* 20.4*  --   --  21.9*  PLT  --  118* 123*  --   --  PENDING  LABPROT  --   --  16.9*  --  16.9*  --   INR  --   --  1.37  --  1.36  --   CREATININE 4.20*  --   --  4.09*  --  4.18*    Estimated Creatinine Clearance: 18.2 mL/min (by C-G formula based on Cr of 4.18).   Assessment: 60 y/o male with sickle cell disease with new onset Afib. Pharmacy consulted to dose warfarin. INR is subtherapeutic today at 1.36 - missed dose on 7/7. Also on SQ heparin until INR at goal. No overt bleeding noted, Hgb is low at 7.6, s/p 2 units PRBC, platelets have been low stable but are pending for today.  Goal of Therapy:  INR 2-3 Monitor platelets by anticoagulation protocol: Yes   Plan:  - Warfarin 5 mg PO qpm - F/U d/c heparin SQ once INR >2 - INR daily - Monitor for s/sx of bleeding - Education complete - legally blind but has a magnifier to help with reading   Renold Genta, PharmD, BCPS Clinical Pharmacist Pager: [phone] 12/25/2015 10:32 AM

## 2015-12-25 NOTE — Progress Notes (Signed)
Subjective: Patient is doing much better today.  No significant shortness of breath or cough. He is diuresing moderately. Hemoglobin remained stable after second unit transfusion.  Patient able to move around without significant exertional dyspnea. His creatinine is however rising. No fever or chills no nausea vomiting or diarrhea.  Objective: Vital signs in last 24 hours: Temp:  [97.5 F (36.4 C)-98.3 F (36.8 C)] 98.3 F (36.8 C) (07/09 0530) Pulse Rate:  [72-78] 72 (07/09 0530) Resp:  [16-20] 20 (07/09 0530) BP: (102-111)/(55-60) 111/59 mmHg (07/09 0555) SpO2:  [95 %-99 %] 99 % (07/09 0530) Weight:  [66.089 kg (145 lb 11.2 oz)] 66.089 kg (145 lb 11.2 oz) (07/09 0555) Weight change: -3.13 kg (-6 lb 14.4 oz) Last BM Date: 12/22/15  Intake/Output from previous day: 07/08 0701 - 07/09 0700 In: 550 [P.O.:550] Out: 1975 [Urine:1975] Intake/Output this shift: Total I/O In: -  Out: 450 [Urine:450]  General appearance: alert, cooperative and no distress Head: Normocephalic, without obvious abnormality, atraumatic Back: symmetric, no curvature. ROM normal. No CVA tenderness. Resp: clear to auscultation bilaterally Chest wall: no tenderness Cardio: regular rate and rhythm, S1, S2 normal, no murmur, click, rub or gallop GI: soft, non-tender; bowel sounds normal; no masses,  no organomegaly Extremities: extremities normal, atraumatic, no cyanosis or edema Pulses: 2+ and symmetric Skin: Skin color, texture, turgor normal. No rashes or lesions Neurologic: Grossly normal  Lab Results:  Recent Labs  12/24/15 0543 12/25/15 0750  WBC 6.7 PENDING  HGB 7.4* 7.6*  HCT 20.4* 21.9*  PLT 123* PENDING   BMET  Recent Labs  12/24/15 0940 12/25/15 0750  NA 134* 135  K 4.0 4.0  CL 102 101  CO2 22 23  GLUCOSE 152* 145*  BUN 99* 103*  CREATININE 4.09* 4.18*  CALCIUM 8.8* 9.1    Studies/Results: Dg Chest 2 View  12/24/2015  CLINICAL DATA:  Shortness of breath since yesterday,  former smoker, recently stopped, history sickle cell disease, stage III chronic kidney disease, CHF EXAM: CHEST  2 VIEW COMPARISON:  With 12/22/2015 FINDINGS: Enlargement of cardiac silhouette with pulmonary vascular congestion. Mediastinal contours normal. Lungs emphysematous but clear. Tiny bibasilar effusions. No pneumothorax. IMPRESSION: Enlargement of cardiac silhouette with pulmonary vascular congestion. Emphysematous changes with tiny bibasilar effusions. Electronically Signed   By: Lavonia Dana M.D.   On: 12/24/2015 08:47    Medications: I have reviewed the patient's current medications.  Assessment/Plan: 1. 1. Acute on Chronic Kidney Disease IV: Appreciate nephrology help.  Creatinine has stayed above 4. Probably his new baseline. Continue to monitor renal function and continue to hold allopurinol.  Continue per nephrology recommendation. 2. Acute respiratory failure with hypoxia: Patient doing much better after gentle diuresis.  Agree with switch to oral diuretics today. We will continue to observe and gentle diuresis. 3. Pulmonary Edema: Appears to have improved but still slightly fluid overloaded with basal crackles.  We will continue per cardiology and nephrology 4. Anemia of chronic disease: Hemoglobin is now 7.6 after transfusion of second unit of packed red blood cell.  We will continue to monitor his H&H  5. Hb  without crisis: Pt's pain controlled. Will resume oral analgesics. Patient may resume hydrea now that Hb is stable above 7. 6. Acute on Chronic Systolic Heart failure: ECHO is showing EF of 40%.  Cardiology has been consulted.  Appreciate their input. 7. Hyponatremia: Probably due to volume expansion.  Continue close monitoring. 8. New onset Afib: Patient's rate is now controlled.  Appreciate cardiology's input.  For DCCV in the next 4 weeks after being on anticoagulation.  Continue warfarin   LOS: 3 days   Warnell Rasnic,LAWAL 12/25/2015, 10:18 AM

## 2015-12-25 NOTE — Progress Notes (Signed)
BP is 96/55. Pt has 75mg  of Hydralazine. PA notified and ordered to hold night dose. Night dose held. Will continue to monitor.

## 2015-12-25 NOTE — Progress Notes (Signed)
Admit: 12/22/2015 LOS: 3  36M CKD4 (mature AVF, Dunham CKA, BL SCr 3.3) admith w/ AoC sCHF 2/2 NICM, AoC Anemia 2/2 SCA, New onset AFib  Subjective:  Feels well No dyspnea when moving about room Good UOP on IV diuretics Weight down some more  07/08 0701 - 07/09 0700 In: 550 [P.O.:550] Out: 1975 [Urine:1975]  Filed Weights   12/23/15 0526 12/24/15 0554 12/25/15 0555  Weight: 66.316 kg (146 lb 3.2 oz) 69.219 kg (152 lb 9.6 oz) 66.089 kg (145 lb 11.2 oz)    Scheduled Meds: . sodium chloride   Intravenous Once  . aspirin EC  81 mg Oral Daily  . calcitRIOL  0.25 mcg Oral Daily  . carvedilol  12.5 mg Oral BID WC  . coumadin book   Does not apply Once  . folic acid  1 mg Oral Daily  . furosemide  120 mg Intravenous Q12H  . heparin  5,000 Units Subcutaneous Q8H  . hydrALAZINE  75 mg Oral Q8H  . isosorbide mononitrate  60 mg Oral Daily  . metolazone  2.5 mg Oral Daily  . polyethylene glycol  17 g Oral Daily  . warfarin  5 mg Oral q1800  . warfarin   Does not apply Once  . Warfarin - Pharmacist Dosing Inpatient   Does not apply q1800   Continuous Infusions:  PRN Meds:.oxyCODONE, polyethylene glycol  Current Labs: reviewed    Physical Exam:  Blood pressure 111/59, pulse 72, temperature 98.3 F (36.8 C), temperature source Oral, resp. rate 20, height 5\' 11"  (1.803 m), weight 66.089 kg (145 lb 11.2 oz), SpO2 99 %. NAD IRIR CTAB No LEE BLind in eyes S/nt/nd No c/c/e RUE AVF +B/T, mature  A 1. AoCKD4 2/2 most likely to sHF exacerbation vs progressive CKD 1. Mature AVF RUE BVT 2. Dunham CKA, SCr was 3.3 11/2015 2. AoC sHF exacerbation, NICM: LVEF 40% 1. Some UOP response to BID IV lasix + metolazone 2. AHF following 3. New onset AFib, rate controlled, now on warfarin 4. Anemia / Sickle Cell Anemia 1. S/p 2u PRBC 5. Macular degeneration / blindness  P 1. Cont BID lasix and metolazone -- begin PO (trial of torsemide 100mg  PO daily )in AM assuming stable 2. Await AM  labs 3. Daily weights, Daily Renal Panel, Strict I/Os, Avoid nephrotoxins (NSAIDs, judicious IV Contrast)    Pearson Grippe MD 12/25/2015, 9:33 AM   Recent Labs Lab 12/22/15 1233 12/23/15 0518 12/24/15 0940  NA 138 134* 134*  K 4.5 4.3 4.0  CL 109 106 102  CO2 21* 19* 22  GLUCOSE 97 116* 152*  BUN 98* 99* 99*  CREATININE 4.09* 4.20* 4.09*  CALCIUM 8.8* 8.4* 8.8*  PHOS  --  6.1* 6.1*    Recent Labs Lab 12/22/15 1233 12/23/15 0944 12/24/15 0543  WBC 8.3 6.4 6.7  NEUTROABS  --   --  4.4  HGB 6.0* 6.2* 7.4*  HCT 16.7* 17.3* 20.4*  MCV 89.3 88.7 87.6  PLT 130* 118* 123*

## 2015-12-26 DIAGNOSIS — E211 Secondary hyperparathyroidism, not elsewhere classified: Secondary | ICD-10-CM | POA: Diagnosis not present

## 2015-12-26 DIAGNOSIS — N179 Acute kidney failure, unspecified: Secondary | ICD-10-CM | POA: Diagnosis not present

## 2015-12-26 DIAGNOSIS — I5022 Chronic systolic (congestive) heart failure: Secondary | ICD-10-CM | POA: Diagnosis not present

## 2015-12-26 DIAGNOSIS — N184 Chronic kidney disease, stage 4 (severe): Secondary | ICD-10-CM | POA: Diagnosis not present

## 2015-12-26 DIAGNOSIS — I481 Persistent atrial fibrillation: Secondary | ICD-10-CM | POA: Diagnosis not present

## 2015-12-26 DIAGNOSIS — D631 Anemia in chronic kidney disease: Secondary | ICD-10-CM | POA: Diagnosis not present

## 2015-12-26 DIAGNOSIS — I420 Dilated cardiomyopathy: Secondary | ICD-10-CM | POA: Diagnosis not present

## 2015-12-26 DIAGNOSIS — I130 Hypertensive heart and chronic kidney disease with heart failure and stage 1 through stage 4 chronic kidney disease, or unspecified chronic kidney disease: Secondary | ICD-10-CM | POA: Diagnosis not present

## 2015-12-26 DIAGNOSIS — I5023 Acute on chronic systolic (congestive) heart failure: Secondary | ICD-10-CM | POA: Diagnosis not present

## 2015-12-26 DIAGNOSIS — I4891 Unspecified atrial fibrillation: Secondary | ICD-10-CM

## 2015-12-26 LAB — RENAL FUNCTION PANEL
Albumin: 3.5 g/dL (ref 3.5–5.0)
Anion gap: 13 (ref 5–15)
BUN: 111 mg/dL — ABNORMAL HIGH (ref 6–20)
CO2: 25 mmol/L (ref 22–32)
Calcium: 9 mg/dL (ref 8.9–10.3)
Chloride: 95 mmol/L — ABNORMAL LOW (ref 101–111)
Creatinine, Ser: 4.07 mg/dL — ABNORMAL HIGH (ref 0.61–1.24)
GFR calc Af Amer: 17 mL/min — ABNORMAL LOW (ref >60)
GFR calc non Af Amer: 15 mL/min — ABNORMAL LOW (ref >60)
Glucose, Bld: 144 mg/dL — ABNORMAL HIGH (ref 65–99)
Phosphorus: 7 mg/dL — ABNORMAL HIGH (ref 2.5–4.6)
Potassium: 3.7 mmol/L (ref 3.5–5.1)
Sodium: 133 mmol/L — ABNORMAL LOW (ref 135–145)

## 2015-12-26 LAB — CBC WITH DIFFERENTIAL/PLATELET
Basophils Absolute: 0 10*3/uL (ref 0.0–0.1)
Basophils Relative: 0 %
Eosinophils Absolute: 0.2 10*3/uL (ref 0.0–0.7)
Eosinophils Relative: 4 %
HCT: 20.4 % — ABNORMAL LOW (ref 39.0–52.0)
Hemoglobin: 7.3 g/dL — ABNORMAL LOW (ref 13.0–17.0)
Lymphocytes Relative: 12 %
Lymphs Abs: 0.6 10*3/uL — ABNORMAL LOW (ref 0.7–4.0)
MCH: 32 pg (ref 26.0–34.0)
MCHC: 35.8 g/dL (ref 30.0–36.0)
MCV: 89.5 fL (ref 78.0–100.0)
Monocytes Absolute: 0.8 10*3/uL (ref 0.1–1.0)
Monocytes Relative: 16 %
Neutro Abs: 3.1 10*3/uL (ref 1.7–7.7)
Neutrophils Relative %: 68 %
Platelets: 107 10*3/uL — ABNORMAL LOW (ref 150–400)
RBC: 2.28 MIL/uL — ABNORMAL LOW (ref 4.22–5.81)
RDW: 21 % — ABNORMAL HIGH (ref 11.5–15.5)
WBC: 4.7 10*3/uL (ref 4.0–10.5)

## 2015-12-26 LAB — PROTIME-INR
INR: 1.34 (ref 0.00–1.49)
Prothrombin Time: 16.7 seconds — ABNORMAL HIGH (ref 11.6–15.2)

## 2015-12-26 LAB — TYPE AND SCREEN
ABO/RH(D): AB POS
Antibody Screen: NEGATIVE
Unit division: 0
Unit division: 0
Unit division: 0

## 2015-12-26 LAB — RETICULOCYTES
RBC.: 2.28 MIL/uL — ABNORMAL LOW (ref 4.22–5.81)
Retic Count, Absolute: 291.8 10*3/uL — ABNORMAL HIGH (ref 19.0–186.0)
Retic Ct Pct: 12.8 % — ABNORMAL HIGH (ref 0.4–3.1)

## 2015-12-26 MED ORDER — WARFARIN SODIUM 7.5 MG PO TABS: 7.5000 mg | ORAL_TABLET | Freq: Once | ORAL | Status: DC

## 2015-12-26 MED ORDER — OXYCODONE HCL 5 MG PO TABS
10.0000 mg | ORAL_TABLET | ORAL | Status: DC | PRN
  Administered 2015-12-26 – 2015-12-27 (×3): 10 mg via ORAL
  Filled 2015-12-26 (×3): qty 2

## 2015-12-26 NOTE — Care Management Important Message (Signed)
Important Message  Patient Details  Name: Patrick [name] MRN: [id number] Date of Birth: April 22, 1956   Medicare Important Message Given:  Yes    Loann Quill 12/26/2015, 9:45 AM

## 2015-12-26 NOTE — Progress Notes (Signed)
Advanced Heart Failure Rounding Note   Subjective:    Admitted with increased dyspnea.  New onset A fib, volume overload, and anemia. Received 1UPRBCs.  Nephrology consulted and started high dose lasix. Over the weekend transitioned to torsemide + metolazone.   Started on coumadin. ECHO on 7/7 with EF ~40%. ?mass on mitral valve.   Overall feeling better. Denies SOB.       Objective:   Weight Range:  Vital Signs:   Temp:  [97.8 F (36.6 C)-98.3 F (36.8 C)] 98.2 F (36.8 C) (07/10 0601) Pulse Rate:  [68-78] 78 (07/10 0835) Resp:  [17-18] 18 (07/10 0835) BP: (96-108)/(55-70) 101/60 mmHg (07/10 0835) SpO2:  [95 %-100 %] 97 % (07/10 0835) Weight:  [142 lb 3.2 oz (64.5 kg)] 142 lb 3.2 oz (64.5 kg) (07/10 0601) Last BM Date: 12/22/15  Weight change: Filed Weights   12/24/15 0554 12/25/15 0555 12/26/15 0601  Weight: 152 lb 9.6 oz (69.219 kg) 145 lb 11.2 oz (66.089 kg) 142 lb 3.2 oz (64.5 kg)    Intake/Output:   Intake/Output Summary (Last 24 hours) at 12/26/15 1006 Last data filed at 12/26/15 0601  Gross per 24 hour  Intake      0 ml  Output   2440 ml  Net  -2440 ml     Physical Exam: General:  NAD HEENT: normal Neck: supple. JVP to ear . Carotids 2+ bilat; no bruits. No lymphadenopathy or thryomegaly appreciated. Cor: PMI nondisplaced. Irregular rate & rhythm. 2/6 TR Lungs: clear Abdomen: soft, nontender, + distended. No hepatosplenomegaly. No bruits or masses. Good bowel sounds. Extremities: no cyanosis, clubbing, rash, edema. RUE fistula  Neuro: alert & orientedx3, cranial nerves grossly intact. moves all 4 extremities w/o difficulty. Affect pleasant  Telemetry: A fib 80s   Labs: Basic Metabolic Panel:  Recent Labs Lab 12/22/15 1233 12/23/15 0518 12/24/15 0940 12/25/15 0750  NA 138 134* 134* 135  K 4.5 4.3 4.0 4.0  CL 109 106 102 101  CO2 21* 19* 22 23  GLUCOSE 97 116* 152* 145*  BUN 98* 99* 99* 103*  CREATININE 4.09* 4.20* 4.09* 4.18*    CALCIUM 8.8* 8.4* 8.8* 9.1  PHOS  --  6.1* 6.1*  --     Liver Function Tests:  Recent Labs Lab 12/23/15 0518 12/24/15 0940  ALBUMIN 3.4* 3.5   No results for input(s): LIPASE, AMYLASE in the last 168 hours. No results for input(s): AMMONIA in the last 168 hours.  CBC:  Recent Labs Lab 12/22/15 1233 12/23/15 0944 12/24/15 0543 12/25/15 0750  WBC 8.3 6.4 6.7 6.2  NEUTROABS  --   --  4.4  --   HGB 6.0* 6.2* 7.4* 7.6*  HCT 16.7* 17.3* 20.4* 21.9*  MCV 89.3 88.7 87.6 90.5  PLT 130* 118* 123* 119*    Cardiac Enzymes: No results for input(s): CKTOTAL, CKMB, CKMBINDEX, TROPONINI in the last 168 hours.  BNP: BNP (last 3 results)  Recent Labs  12/22/15 1838  BNP 2524.5*    ProBNP (last 3 results) No results for input(s): PROBNP in the last 8760 hours.    Other results:  Imaging: No results found.   Medications:     Scheduled Medications: . sodium chloride   Intravenous Once  . aspirin EC  81 mg Oral Daily  . calcitRIOL  0.25 mcg Oral Daily  . carvedilol  12.5 mg Oral BID WC  . coumadin book   Does not apply Once  . folic acid  1 mg Oral  Daily  . heparin  5,000 Units Subcutaneous Q8H  . hydrALAZINE  75 mg Oral Q8H  . isosorbide mononitrate  60 mg Oral Daily  . metolazone  2.5 mg Oral Daily  . polyethylene glycol  17 g Oral Daily  . torsemide  100 mg Oral Daily  . warfarin  5 mg Oral q1800  . warfarin   Does not apply Once  . Warfarin - Pharmacist Dosing Inpatient   Does not apply q1800    Infusions:    PRN Medications: oxyCODONE, polyethylene glycol   Assessment:  1. A/C Systolic HF 2. A fib 3. Anemia 4. CKD Stage IV 5. Former Smoker  6. Legally Blind    Plan/Discussion:    ECHO on 12/23/15 with ? mass on mitral valve . EF 40%. Will need TEE to further evaluate. TEE set up for 7/10 at 08:00   For now continue current dose of carvedilol 12.5 mg twice a day, hydralazine 75 mg tid and  imdur 60 mg daily.  Volume status ok. Diuresed  with IV lasix and transitioned to torsemide 100 mg daily. Per Nephrology. Creatinine 4.18. Overall weight down 6 pounds.    New onset A fib. On coumadin. INR 1.34. Rate controlled. If remains in A fib can cardiovert after INR therapeutic for 4 weeks.   Consult cardiac rehab.   Length of Stay: 4   Amy [name] NP-C  12/26/2015, 10:06 AM  Advanced Heart Failure Team Pager [phone] (M-F; 7a - 4p)  Please contact [facility] for night-coverage after hours (4p -7a ) and weekends on [email or web address]  Patient seen with PA, agree with the above note.  Stable today, breathing better.  Remains in atrial fibrillation with controlled rate.  Still some volume overload on exam but improved.   Plan to do DCCV after 1 month therapeutic warfarin.    Plan for TEE tomorrow given possible vegetation on MV though no fevers or WBC elevation.   Loralie Champagne 12/26/2015 12:57 PM

## 2015-12-26 NOTE — Progress Notes (Signed)
ANTICOAGULATION CONSULT NOTE - Follow Up Consult  Pharmacy Consult for warfarin Indication: atrial fibrillation  No Known Allergies  Patient Measurements: Height: 5\' 11"  (180.3 cm) Weight: 142 lb 3.2 oz (64.5 kg) IBW/kg (Calculated) : 75.3  Vital Signs: Temp: 98.2 F (36.8 C) (07/10 0601) Temp Source: Oral (07/10 0601) BP: 101/60 mmHg (07/10 0835) Pulse Rate: 78 (07/10 0835)  Labs:  Recent Labs  12/23/15 0944 12/24/15 0543 12/24/15 0940 12/25/15 0542 12/25/15 0750 12/26/15 0625  HGB 6.2* 7.4*  --   --  7.6*  --   HCT 17.3* 20.4*  --   --  21.9*  --   PLT 118* 123*  --   --  119*  --   LABPROT  --  16.9*  --  16.9*  --  16.7*  INR  --  1.37  --  1.36  --  1.34  CREATININE  --   --  4.09*  --  4.18*  --     Estimated Creatinine Clearance: 17.8 mL/min (by C-G formula based on Cr of 4.18).   Assessment: 60 y/o male with sickle cell disease with new onset Afib. Pharmacy consulted to dose warfarin. INR is subtherapeutic today at 1.34 - missed dose on 7/7. Also on SQ heparin until INR at goal. No overt bleeding noted, Hgb is low at 7.6, s/p 2 units PRBC, platelets have been low but stable at 119 yesterday.  Goal of Therapy:  INR 2-3 Monitor platelets by anticoagulation protocol: Yes   Plan:  - Warfarin 5 mg daily - F/U d/c heparin SQ once INR >2 - INR daily, CBC tomorrow - Monitor for s/sx of bleeding - Education complete - legally blind but has a magnifier to help with reading  Governor Specking, PharmD Clinical Pharmacist Pager: [phone]  12/26/2015 8:49 AM

## 2015-12-26 NOTE — Progress Notes (Signed)
CARDIAC REHAB PHASE I   PRE:  Rate/Rhythm: 71 Afib  BP:  Supine: 97/60  Sitting:   Standing:    SaO2: 96%RA  MODE:  Ambulation: 550 ft   POST:  Rate/Rhythm: 77 Afib  BP:  Supine:   Sitting: 112/60  Standing:    SaO2: 96%RA 1110-1156 Pt walked 550 ft on RA with hand held asst with steady gait. No DOE noted. Pt stated breathing much better. Gave CHF booklet and low sodium diets. Discussed yellow zone and when to call MD. Pt has scales but had not been weighing every day. Discussed importance of calling with weigh gain and adhering to 2000 mg sodium restriction and 2L FR. Pt stated he had not been watching sodium closely and still using the salt shaker at times. Will continue ed as we see pt. Very receptive to ed.   Graylon Good, RN BSN  12/26/2015 11:55 AM

## 2015-12-26 NOTE — Progress Notes (Signed)
SICKLE CELL SERVICE PROGRESS NOTE  Patrick [name] [id number] DOB: February 21, 1956 DOA: 12/22/2015 PCP: Angelica Chessman, MD  Assessment/Plan: Active Problems:   Anemia   AKI (acute kidney injury) ([place])   Acute on chronic diastolic congestive heart failure (HCC)   Chronic kidney disease (CKD), stage IV (severe) (HCC)   Acute on chronic systolic heart failure (HCC)   Atrial fibrillation ([place])  1. New Onset A-Fib: Pt started on Coumadin and is currently has a sub-therapeutic INR. Pharmacy to continue titration. He will follow with [facility] as out patient 2. Intra-atrial mass: ECHO shows possible mass in anterior mitral valve. I have spoken with Dr. Algernon Huxley from Cardiology and he will review TTE and make further decisions Re: TEE. 3. Acute on Chronic Systolic Heart Failure: Pt has had a 5# weight loss since admission secondary to diuretic effect. Unfortunately he has had a concurrent increase in Cr. Will discuss with Cardiology and renal plans going forward for optimum diuresis. 4. Acute on Chronic CKD IV: Will discuss with renal plans going forward for dialysis. 5. H/O Pulmonary Hypertension: Follow up with HF clinic. 6. Anemia of Chronic Disease: Pt is post transfusion of 1 unit RBC's. Hb stable at 7.6. Will check CBC and reticulocytes today and make a decision about restarting Hydrea. (Pt reports it was stopped when his Hb was low and never re-assessed for resumption.) 7. Hb SS without Crisis with intermittent pain: Pt reports that when the pain does come on, it is   poorly controlled with the current dose of medication. I have increased the dose to 10 mg as he is unable to receive NSAID's due to CKD. 8. Legally Blind: Pt functions at a community level and is typically able to get to his appointments. He stopped going to HF Clinic as he said that he was never scheduled for return and was not aware that he was expected to continue. He has been compliant with attendance to  Nephrology and general medicine.  Code Status: Full Code Family Communication: N/A Disposition Plan: Not yet ready for discharge  [place].  Pager [phone]. If 7PM-7AM, please contact night-coverage.  12/26/2015, 9:23 AM  LOS: 4 days   Interim History: Pt reports that he feels much better that when I saw him on admission. He reports that he is having some intermittent but at times intense pain in his low back which is poorly controlled by current dose of Oxycodone. He has no there c/o at this time and reports that he feels that his breathing is back to normal.  Consultants:  Cardiology- Dr. Algernon Huxley  Nephrology- Dr. Jonnie Finner  Procedures:  None  Antibiotics:  None   Objective: Filed Vitals:   12/25/15 1400 12/25/15 2109 12/26/15 0601 12/26/15 0835  BP: 108/70 96/55 107/57 101/60  Pulse:  68 69 78  Temp:  98.3 F (36.8 C) 98.2 F (36.8 C)   TempSrc:  Oral Oral   Resp:  18 17 18   Height:      Weight:   142 lb 3.2 oz (64.5 kg)   SpO2:  99% 95% 97%   Weight change: -3 lb 8.1 oz (-1.589 kg)  Intake/Output Summary (Last 24 hours) at 12/26/15 0923 Last data filed at 12/26/15 0601  Gross per 24 hour  Intake      0 ml  Output   2440 ml  Net  -2440 ml    General: Alert, awake, oriented x3, in no acute distress.  HEENT: [place]/AT PEERL, EOMI, anicteric Neck: Trachea midline,  no masses, no thyromegal,y no JVD, no carotid bruit OROPHARYNX:  Moist, No exudate/ erythema/lesions.  Heart: Regular rate and rhythm, without murmurs, rubs, gallops.  Lungs: Clear to auscultation, no wheezing or rhonchi noted.  Abdomen: Soft, nontender, nondistended, positive bowel sounds, no masses no hepatosplenomegaly noted..  Neuro: No focal neurological deficits noted cranial nerves II through XII grossly intact.  Strength at functional baseline in bilateral upper and lower extremities. Musculoskeletal: No warmth swelling or erythema around joints, no spinal tenderness  noted. Psychiatric: Patient alert and oriented x3, good insight and cognition, good recent to remote recall. Lymph node survey: No cervical axillary or inguinal lymphadenopathy noted.   Data Reviewed: Basic Metabolic Panel:  Recent Labs Lab 12/22/15 1233 12/23/15 0518 12/24/15 0940 12/25/15 0750  NA 138 134* 134* 135  K 4.5 4.3 4.0 4.0  CL 109 106 102 101  CO2 21* 19* 22 23  GLUCOSE 97 116* 152* 145*  BUN 98* 99* 99* 103*  CREATININE 4.09* 4.20* 4.09* 4.18*  CALCIUM 8.8* 8.4* 8.8* 9.1  PHOS  --  6.1* 6.1*  --    Liver Function Tests:  Recent Labs Lab 12/23/15 0518 12/24/15 0940  ALBUMIN 3.4* 3.5   No results for input(s): LIPASE, AMYLASE in the last 168 hours. No results for input(s): AMMONIA in the last 168 hours. CBC:  Recent Labs Lab 12/22/15 1233 12/23/15 0944 12/24/15 0543 12/25/15 0750  WBC 8.3 6.4 6.7 6.2  NEUTROABS  --   --  4.4  --   HGB 6.0* 6.2* 7.4* 7.6*  HCT 16.7* 17.3* 20.4* 21.9*  MCV 89.3 88.7 87.6 90.5  PLT 130* 118* 123* 119*   Cardiac Enzymes: No results for input(s): CKTOTAL, CKMB, CKMBINDEX, TROPONINI in the last 168 hours. BNP (last 3 results)  Recent Labs  12/22/15 1838  BNP 2524.5*    ProBNP (last 3 results) No results for input(s): PROBNP in the last 8760 hours.  CBG: No results for input(s): GLUCAP in the last 168 hours.  No results found for this or any previous visit (from the past 240 hour(s)).   Studies: Dg Chest 2 View  12/24/2015  CLINICAL DATA:  Shortness of breath since yesterday, former smoker, recently stopped, history sickle cell disease, stage III chronic kidney disease, CHF EXAM: CHEST  2 VIEW COMPARISON:  With 12/22/2015 FINDINGS: Enlargement of cardiac silhouette with pulmonary vascular congestion. Mediastinal contours normal. Lungs emphysematous but clear. Tiny bibasilar effusions. No pneumothorax. IMPRESSION: Enlargement of cardiac silhouette with pulmonary vascular congestion. Emphysematous changes with  tiny bibasilar effusions. Electronically Signed   By: Lavonia Dana M.D.   On: 12/24/2015 08:47   Dg Chest 2 View  12/22/2015  CLINICAL DATA:  Short of breath. Sickle cell. History of hypertension, kidney failure, CHF EXAM: CHEST  2 VIEW COMPARISON:  01/13/2014 FINDINGS: COPD with pulmonary hyperinflation Cardiac enlargement. Pulmonary venous congestion. Small pleural effusions consistent with fluid overload. Slight interstitial edema. Negative for pneumonia.  No mass lesion. IMPRESSION: COPD Mild fluid overload with early interstitial edema and small pleural effusions. Electronically Signed   By: Franchot Gallo M.D.   On: 12/22/2015 11:46   Dg Abd 1 View  12/22/2015  CLINICAL DATA:  Abdominal swelling for 3 weeks with pain for 4 days. Constipation and shortness of breath. Chronic kidney disease. Sickle cell disease. EXAM: ABDOMEN - 1 VIEW COMPARISON:  02/07/2013 ultrasound.  CT 02/07/2013 FINDINGS: Single supine view abdomen and pelvis. A gallstone measures 1.8 cm. Non-obstructive bowel gas pattern. No calcific densities over the  kidneys. No appendicolith. Probable phleboliths in the left hemipelvis. Probable renal osteodystrophy, with heterogeneous increased marrow density throughout. IMPRESSION: No evidence of bowel obstruction or free intraperitoneal air. Cholelithiasis. Electronically Signed   By: Abigail Miyamoto M.D.   On: 12/22/2015 16:19    Scheduled Meds: . sodium chloride   Intravenous Once  . aspirin EC  81 mg Oral Daily  . calcitRIOL  0.25 mcg Oral Daily  . carvedilol  12.5 mg Oral BID WC  . coumadin book   Does not apply Once  . folic acid  1 mg Oral Daily  . heparin  5,000 Units Subcutaneous Q8H  . hydrALAZINE  75 mg Oral Q8H  . isosorbide mononitrate  60 mg Oral Daily  . metolazone  2.5 mg Oral Daily  . polyethylene glycol  17 g Oral Daily  . torsemide  100 mg Oral Daily  . warfarin  5 mg Oral q1800  . warfarin   Does not apply Once  . Warfarin - Pharmacist Dosing Inpatient   Does not  apply q1800   Continuous Infusions:   Active Problems:   Anemia   AKI (acute kidney injury) ([place])   Acute on chronic diastolic congestive heart failure (HCC)   Chronic kidney disease (CKD), stage IV (severe) (HCC)   Acute on chronic systolic heart failure (HCC)   Atrial fibrillation ([place])   Time spent 45 minutes. Greater than 50% involved face to face contact with the patient for assessment, counseling and coordination of care.

## 2015-12-26 NOTE — Progress Notes (Signed)
Admit: 12/22/2015 LOS: 4  41M CKD4 (mature AVF, Dunham CKA, BL SCr 3.3) admith w/ AoC sCHF 2/2 NICM, AoC Anemia 2/2 SCA, New onset AFib  Subjective:  Feels well, eating well TEE ordered Ambulated w/o SOB, on RA   07/09 0701 - 07/10 0700 In: -  Out: 2890 [Urine:2890]  Filed Weights   12/24/15 0554 12/25/15 0555 12/26/15 0601  Weight: 69.219 kg (152 lb 9.6 oz) 66.089 kg (145 lb 11.2 oz) 64.5 kg (142 lb 3.2 oz)    Scheduled Meds: . sodium chloride   Intravenous Once  . aspirin EC  81 mg Oral Daily  . calcitRIOL  0.25 mcg Oral Daily  . carvedilol  12.5 mg Oral BID WC  . folic acid  1 mg Oral Daily  . heparin  5,000 Units Subcutaneous Q8H  . hydrALAZINE  75 mg Oral Q8H  . isosorbide mononitrate  60 mg Oral Daily  . metolazone  2.5 mg Oral Daily  . polyethylene glycol  17 g Oral Daily  . torsemide  100 mg Oral Daily  . warfarin  5 mg Oral q1800  . warfarin   Does not apply Once  . Warfarin - Pharmacist Dosing Inpatient   Does not apply q1800   Continuous Infusions:  PRN Meds:.oxyCODONE, polyethylene glycol  Current Labs: reviewed    Physical Exam:  Blood pressure 101/60, pulse 78, temperature 98.2 F (36.8 C), temperature source Oral, resp. rate 18, height 5\' 11"  (1.803 m), weight 64.5 kg (142 lb 3.2 oz), SpO2 97 %. NAD IRIR CTAB No LEE BLind in eyes S/nt/nd No c/c/e RUE AVF +B/T, mature  A 1. AoCKD4 2/2 most likely to sHF exacerbation vs progressive CKD 1. Mature AVF RUE BVT 2. Dunham CKA, SCr was 3.3 11/2015 3. Stable as inpatient 2. AoC sHF exacerbation, NICM: LVEF 40% 1. Some UOP response to loop diuretic + metolazone 2. AHF following 3. New onset AFib, rate controlled, now on warfarin 4. Anemia / Sickle Cell Anemia 1. S/p 2u PRBC during admission 5. Macular degeneration / blindness  P 1. Cont on 100mg  qAM torsemide and daily metolazone; if BUN up further tomorrow hold metolazone and readjust frequency 2. No need for HD; clinically much  improved 3. Daily weights, Daily Renal Panel, Strict I/Os, Avoid nephrotoxins (NSAIDs, judicious IV Contrast)    Pearson Grippe MD 12/26/2015, 12:39 PM   Recent Labs Lab 12/23/15 0518 12/24/15 0940 12/25/15 0750 12/26/15 1022  NA 134* 134* 135 133*  K 4.3 4.0 4.0 3.7  CL 106 102 101 95*  CO2 19* 22 23 25   GLUCOSE 116* 152* 145* 144*  BUN 99* 99* 103* 111*  CREATININE 4.20* 4.09* 4.18* 4.07*  CALCIUM 8.4* 8.8* 9.1 9.0  PHOS 6.1* 6.1*  --  7.0*    Recent Labs Lab 12/24/15 0543 12/25/15 0750 12/26/15 0932  WBC 6.7 6.2 PENDING  NEUTROABS 4.4  --  PENDING  HGB 7.4* 7.6* 7.3*  HCT 20.4* 21.9* 20.4*  MCV 87.6 90.5 89.5  PLT 123* 119* PENDING

## 2015-12-27 ENCOUNTER — Inpatient Hospital Stay (HOSPITAL_COMMUNITY): Payer: PPO

## 2015-12-27 ENCOUNTER — Encounter (HOSPITAL_COMMUNITY): Payer: Self-pay

## 2015-12-27 ENCOUNTER — Encounter (HOSPITAL_COMMUNITY)
Admission: EM | Disposition: A | Discharge status: Home / Self Care | Payer: Self-pay | Source: Home / Self Care | Attending: Internal Medicine

## 2015-12-27 DIAGNOSIS — D631 Anemia in chronic kidney disease: Secondary | ICD-10-CM | POA: Diagnosis not present

## 2015-12-27 DIAGNOSIS — I340 Nonrheumatic mitral (valve) insufficiency: Secondary | ICD-10-CM | POA: Diagnosis not present

## 2015-12-27 DIAGNOSIS — I481 Persistent atrial fibrillation: Secondary | ICD-10-CM

## 2015-12-27 DIAGNOSIS — E211 Secondary hyperparathyroidism, not elsewhere classified: Secondary | ICD-10-CM | POA: Diagnosis not present

## 2015-12-27 DIAGNOSIS — I059 Rheumatic mitral valve disease, unspecified: Secondary | ICD-10-CM

## 2015-12-27 DIAGNOSIS — N184 Chronic kidney disease, stage 4 (severe): Secondary | ICD-10-CM | POA: Diagnosis not present

## 2015-12-27 DIAGNOSIS — N189 Chronic kidney disease, unspecified: Secondary | ICD-10-CM | POA: Diagnosis not present

## 2015-12-27 DIAGNOSIS — I058 Other rheumatic mitral valve diseases: Secondary | ICD-10-CM

## 2015-12-27 DIAGNOSIS — N179 Acute kidney failure, unspecified: Secondary | ICD-10-CM | POA: Diagnosis not present

## 2015-12-27 DIAGNOSIS — I5023 Acute on chronic systolic (congestive) heart failure: Secondary | ICD-10-CM | POA: Diagnosis not present

## 2015-12-27 DIAGNOSIS — I5022 Chronic systolic (congestive) heart failure: Secondary | ICD-10-CM | POA: Diagnosis not present

## 2015-12-27 DIAGNOSIS — I420 Dilated cardiomyopathy: Secondary | ICD-10-CM | POA: Diagnosis not present

## 2015-12-27 DIAGNOSIS — I130 Hypertensive heart and chronic kidney disease with heart failure and stage 1 through stage 4 chronic kidney disease, or unspecified chronic kidney disease: Secondary | ICD-10-CM | POA: Diagnosis not present

## 2015-12-27 HISTORY — PX: TEE WITHOUT CARDIOVERSION: SHX5443

## 2015-12-27 LAB — CBC WITH DIFFERENTIAL/PLATELET
Basophils Absolute: 0.1 10*3/uL (ref 0.0–0.1)
Basophils Relative: 1 %
Eosinophils Absolute: 0.2 10*3/uL (ref 0.0–0.7)
Eosinophils Relative: 4 %
HCT: 21.2 % — ABNORMAL LOW (ref 39.0–52.0)
Hemoglobin: 7.4 g/dL — ABNORMAL LOW (ref 13.0–17.0)
Lymphocytes Relative: 14 %
Lymphs Abs: 0.8 10*3/uL (ref 0.7–4.0)
MCH: 31.5 pg (ref 26.0–34.0)
MCHC: 34.9 g/dL (ref 30.0–36.0)
MCV: 90.2 fL (ref 78.0–100.0)
Monocytes Absolute: 0.7 10*3/uL (ref 0.1–1.0)
Monocytes Relative: 13 %
Neutro Abs: 3.8 10*3/uL (ref 1.7–7.7)
Neutrophils Relative %: 68 %
Platelets: 102 10*3/uL — ABNORMAL LOW (ref 150–400)
RBC: 2.35 MIL/uL — ABNORMAL LOW (ref 4.22–5.81)
RDW: 20.6 % — ABNORMAL HIGH (ref 11.5–15.5)
WBC: 5.6 10*3/uL (ref 4.0–10.5)

## 2015-12-27 LAB — RENAL FUNCTION PANEL
Albumin: 3.7 g/dL (ref 3.5–5.0)
Anion gap: 10 (ref 5–15)
BUN: 118 mg/dL — ABNORMAL HIGH (ref 6–20)
CO2: 26 mmol/L (ref 22–32)
Calcium: 8.8 mg/dL — ABNORMAL LOW (ref 8.9–10.3)
Chloride: 98 mmol/L — ABNORMAL LOW (ref 101–111)
Creatinine, Ser: 4.05 mg/dL — ABNORMAL HIGH (ref 0.61–1.24)
GFR calc Af Amer: 17 mL/min — ABNORMAL LOW (ref >60)
GFR calc non Af Amer: 15 mL/min — ABNORMAL LOW (ref >60)
Glucose, Bld: 94 mg/dL (ref 65–99)
Phosphorus: 7.2 mg/dL — ABNORMAL HIGH (ref 2.5–4.6)
Potassium: 3.9 mmol/L (ref 3.5–5.1)
Sodium: 134 mmol/L — ABNORMAL LOW (ref 135–145)

## 2015-12-27 LAB — RETICULOCYTES
RBC.: 2.35 MIL/uL — ABNORMAL LOW (ref 4.22–5.81)
Retic Count, Absolute: 171.6 10*3/uL (ref 19.0–186.0)
Retic Ct Pct: 7.3 % — ABNORMAL HIGH (ref 0.4–3.1)

## 2015-12-27 LAB — PROTIME-INR
INR: 1.39 (ref 0.00–1.49)
Prothrombin Time: 17.2 seconds — ABNORMAL HIGH (ref 11.6–15.2)

## 2015-12-27 SURGERY — ECHOCARDIOGRAM, TRANSESOPHAGEAL
Anesthesia: Moderate Sedation

## 2015-12-27 MED ORDER — DIPHENHYDRAMINE HCL 50 MG/ML IJ SOLN
INTRAMUSCULAR | Status: AC
  Filled 2015-12-27: qty 1

## 2015-12-27 MED ORDER — BUTAMBEN-TETRACAINE-BENZOCAINE 2-2-14 % EX AERO
INHALATION_SPRAY | CUTANEOUS | Status: DC | PRN
  Administered 2015-12-27: 2 via TOPICAL

## 2015-12-27 MED ORDER — WARFARIN SODIUM 5 MG PO TABS: 5.0000 mg | ORAL_TABLET | Freq: Every day | ORAL | Status: DC

## 2015-12-27 MED ORDER — HYDROXYUREA 500 MG PO CAPS: ORAL_CAPSULE | ORAL | Status: DC

## 2015-12-27 MED ORDER — FENTANYL CITRATE (PF) 100 MCG/2ML IJ SOLN
INTRAMUSCULAR | Status: DC | PRN
  Administered 2015-12-27: 25 ug via INTRAVENOUS
  Administered 2015-12-27 (×2): 12.5 ug via INTRAVENOUS

## 2015-12-27 MED ORDER — TORSEMIDE 100 MG PO TABS: 100.0000 mg | ORAL_TABLET | Freq: Every day | ORAL | Status: DC

## 2015-12-27 MED ORDER — SODIUM CHLORIDE 0.9 % IV SOLN: INTRAVENOUS | Status: DC

## 2015-12-27 MED ORDER — CARVEDILOL 12.5 MG PO TABS: 12.5000 mg | ORAL_TABLET | Freq: Two times a day (BID) | ORAL | Status: DC

## 2015-12-27 MED ORDER — FENTANYL CITRATE (PF) 100 MCG/2ML IJ SOLN
INTRAMUSCULAR | Status: AC
  Filled 2015-12-27: qty 2

## 2015-12-27 MED ORDER — MIDAZOLAM HCL 5 MG/ML IJ SOLN
INTRAMUSCULAR | Status: AC
  Filled 2015-12-27: qty 2

## 2015-12-27 MED ORDER — WARFARIN SODIUM 7.5 MG PO TABS: 7.5000 mg | ORAL_TABLET | Freq: Every day | ORAL | Status: DC

## 2015-12-27 MED ORDER — MIDAZOLAM HCL 10 MG/2ML IJ SOLN
INTRAMUSCULAR | Status: DC | PRN
  Administered 2015-12-27 (×2): 2 mg via INTRAVENOUS

## 2015-12-27 NOTE — Progress Notes (Signed)
  Echocardiogram Echocardiogram Transesophageal has been performed.  Johny Chess 12/27/2015, 9:09 AM

## 2015-12-27 NOTE — Progress Notes (Signed)
Advanced Heart Failure Rounding Note   Subjective:    Admitted with increased dyspnea.  New onset A fib, volume overload, and anemia. Received 1UPRBCs.  Nephrology consulted and started high dose lasix. Over the weekend transitioned to torsemide + metolazone.   Started on coumadin. ECHO on 7/7 with EF ~40%. No vegetation on mitral valve.    Overall feeling better. Denies SOB.       Objective:   Weight Range:  Vital Signs:   Temp:  [97.8 F (36.6 C)-98.2 F (36.8 C)] 98 F (36.7 C) (07/11 0730) Pulse Rate:  [62-73] 65 (07/11 0920) Resp:  [9-17] 16 (07/11 0920) BP: (93-127)/(54-79) 104/79 mmHg (07/11 0920) SpO2:  [92 %-100 %] 92 % (07/11 0920) Weight:  [135 lb 5.8 oz (61.4 kg)] 135 lb 5.8 oz (61.4 kg) (07/11 0606) Last BM Date: 12/22/15  Weight change: Filed Weights   12/25/15 0555 12/26/15 0601 12/27/15 0606  Weight: 145 lb 11.2 oz (66.089 kg) 142 lb 3.2 oz (64.5 kg) 135 lb 5.8 oz (61.4 kg)    Intake/Output:  No intake or output data in the 24 hours ending 12/27/15 1122   Physical Exam: General:  NAD in bed.  HEENT: normal Neck: supple. JVP 8-9 . Carotids 2+ bilat; no bruits. No lymphadenopathy or thryomegaly appreciated. Cor: PMI nondisplaced. Irregular rate & rhythm. 2/6 TR Lungs: clear Abdomen: soft, nontender, nondistended. No hepatosplenomegaly. No bruits or masses. Good bowel sounds. Extremities: no cyanosis, clubbing, rash, edema. RUE fistula  Neuro: alert & orientedx3, cranial nerves grossly intact. moves all 4 extremities w/o difficulty. Affect pleasant  Telemetry: A fib 80s   Labs: Basic Metabolic Panel:  Recent Labs Lab 12/23/15 0518 12/24/15 0940 12/25/15 0750 12/26/15 1022 12/27/15 0942  NA 134* 134* 135 133* 134*  K 4.3 4.0 4.0 3.7 3.9  CL 106 102 101 95* 98*  CO2 19* 22 23 25 26   GLUCOSE 116* 152* 145* 144* 94  BUN 99* 99* 103* 111* 118*  CREATININE 4.20* 4.09* 4.18* 4.07* 4.05*  CALCIUM 8.4* 8.8* 9.1 9.0 8.8*  PHOS 6.1* 6.1*   --  7.0* 7.2*    Liver Function Tests:  Recent Labs Lab 12/23/15 0518 12/24/15 0940 12/26/15 1022 12/27/15 0942  ALBUMIN 3.4* 3.5 3.5 3.7   No results for input(s): LIPASE, AMYLASE in the last 168 hours. No results for input(s): AMMONIA in the last 168 hours.  CBC:  Recent Labs Lab 12/23/15 0944 12/24/15 0543 12/25/15 0750 12/26/15 0932 12/27/15 0942  WBC 6.4 6.7 6.2 4.7 5.6  NEUTROABS  --  4.4  --  3.1 3.8  HGB 6.2* 7.4* 7.6* 7.3* 7.4*  HCT 17.3* 20.4* 21.9* 20.4* 21.2*  MCV 88.7 87.6 90.5 89.5 90.2  PLT 118* 123* 119* 107* 102*    Cardiac Enzymes: No results for input(s): CKTOTAL, CKMB, CKMBINDEX, TROPONINI in the last 168 hours.  BNP: BNP (last 3 results)  Recent Labs  12/22/15 1838  BNP 2524.5*    ProBNP (last 3 results) No results for input(s): PROBNP in the last 8760 hours.    Other results:  Imaging: No results found.   Medications:     Scheduled Medications: . sodium chloride   Intravenous Once  . aspirin EC  81 mg Oral Daily  . calcitRIOL  0.25 mcg Oral Daily  . carvedilol  12.5 mg Oral BID WC  . folic acid  1 mg Oral Daily  . heparin  5,000 Units Subcutaneous Q8H  . hydrALAZINE  75 mg Oral Q8H  .  isosorbide mononitrate  60 mg Oral Daily  . metolazone  2.5 mg Oral Daily  . polyethylene glycol  17 g Oral Daily  . torsemide  100 mg Oral Daily  . warfarin  5 mg Oral q1800  . warfarin   Does not apply Once  . Warfarin - Pharmacist Dosing Inpatient   Does not apply q1800    Infusions:    PRN Medications: oxyCODONE, polyethylene glycol   Assessment:  1. A/C Systolic HF 2. A fib 3. Anemia 4. CKD Stage IV 5. Former Smoker  6. Legally Blind    Plan/Discussion:    ECHO on 12/23/15 with ? mass on mitral valve . EF 40%. TEE today no vegetation. No further work up needed at this time per Dr Aundra Dubin.    For now continue current dose of carvedilol 12.5 mg twice a day, hydralazine 75 mg tid and  imdur 60 mg daily.  Volume status  ok. Continue torsemide 100 mg daily + metolazone 2.5 mg daily. Per Nephrology. Creatinine 4.07.  Overall weight down 13 pounds.    New onset A fib. On coumadin. INR 1.39. Rate controlled. If remains in A fib can cardiovert after INR therapeutic for 4 weeks.   Consult cardiac rehab.   Will set up follow up in the HF clinic.  August 4th at 9:20   Length of Stay: 5   Eathan Groman NP-C  12/27/2015, 11:22 AM  Advanced Heart Failure Team Pager [phone] (M-F; 7a - 4p)  Please contact [facility] for night-coverage after hours (4p -7a ) and weekends on [email or web address]

## 2015-12-27 NOTE — Progress Notes (Signed)
ANTICOAGULATION CONSULT NOTE - Follow Up Consult  Pharmacy Consult for warfarin Indication: atrial fibrillation  No Known Allergies  Patient Measurements: Height: 5\' 11"  (180.3 cm) Weight: 135 lb 5.8 oz (61.4 kg) IBW/kg (Calculated) : 75.3  Vital Signs: Temp: 98 F (36.7 C) (07/11 0730) Temp Source: Oral (07/11 0730) BP: 104/79 mmHg (07/11 0920) Pulse Rate: 65 (07/11 0920)  Labs:  Recent Labs  12/25/15 0542  12/25/15 0750 12/26/15 0625 12/26/15 0932 12/26/15 1022 12/27/15 0942  HGB  --   < > 7.6*  --  7.3*  --  7.4*  HCT  --   --  21.9*  --  20.4*  --  21.2*  PLT  --   --  119*  --  107*  --  102*  LABPROT 16.9*  --   --  16.7*  --   --  17.2*  INR 1.36  --   --  1.34  --   --  1.39  CREATININE  --   --  4.18*  --   --  4.07* 4.05*  < > = values in this interval not displayed.  Estimated Creatinine Clearance: 17.5 mL/min (by C-G formula based on Cr of 4.05).   Assessment: 61 y/o male with sickle cell disease with new onset Afib. Pharmacy consulted to dose warfarin. INR is subtherapeutic today at 1.34 - missed dose on 7/7. Also on SQ heparin until INR at goal. No overt bleeding noted, Hgb is chronically low given scd 7.4 (stable), platelets 102.  TEE this morning- round density noted.  Goal of Therapy:  INR 2-3 Monitor platelets by anticoagulation protocol: Yes   Plan:  - Warfarin 7.5 mg daily - F/U d/c heparin SQ once INR >2 - INR daily, CBC tomorrow - Monitor for s/sx of bleeding - Education complete - legally blind but has a magnifier to help with reading  Erin Hearing PharmD., BCPS Clinical Pharmacist Pager [phone] 12/27/2015 1:32 PM

## 2015-12-27 NOTE — H&P (View-Only) (Signed)
Patient ID: VERONICA GILHOOLEY MRN: [id number] DOB/AGE: May 12, 1956 60 y.o.  Admit date: 12/22/2015 Referring Physician: Zigmund Daniel Primary Physician: Dorena Dew, FNP Primary Cardiologist: Bensimhon (CHF clinic) Reason for Consultation: acute CHF  HPI: 60 yo male with history of chronic systolic CHF, non-ischemic cardiomyopathy, sickle cell anemia, chronic kidney disease and tobacco abuse who is admitted today with worsening dyspnea. He is followed in the CHF clinic. His LV systolic dysfunction was diagnosed in December 2012. Cardiac cath in December 2012 with no evidence of CAD. He has been admitted several times over the last three years with anemia and volume overload. He has been followed in the CHF clinic and is not on a Ace-inhibitor due to renal failure. He is not on spironolactone due to hyperkalemia. He is followed in Nephrology by Dr. Lorrene Reid and has a right upper extremity fistula that is maturing. He has not been started on HD. Dry weight in 2015 around 140 lbs. Last echo June 2015 with LVEF=40%, dilated LV cavity. Chest x-ray today with small bilateral pleural effusions and mild interstitial edema. EKG with atrial fibrillation.   He tells me today that he has been having progressive worsening of dyspnea for several weeks along with abdominal distention. No chest pain, dizziness, near syncope, syncope, fever, chills, rigors, cough.     Past Medical History  Diagnosis Date  . Swelling of extremity, right   . Swelling abdomen   . Swelling of extremity, left   . Blood transfusion     "I've had a bunch of them"  . Blood dyscrasia   . Legally blind   . Sickle cell disease, type SS ([place])   . [id number])     "probably weekly" (01/13/2014)  . Gout   . Chronic kidney disease (CKD), stage III (moderate)   . CHF (congestive heart failure) ([place])     followed by hf clinic  . Shortness of breath dyspnea      Past Surgical History  Procedure Laterality Date  . Cardiac  catheterization  05/2011  . Bascilic vein transposition Right 04/12/2014    Procedure: BASILIC VEIN TRANSPOSITION;  Surgeon: Elam Dutch, MD;  Location: [facility];  Service: Vascular;  Laterality: Right;  . Left and right heart catheterization with coronary angiogram N/A 06/11/2011    Procedure: LEFT AND RIGHT HEART CATHETERIZATION WITH CORONARY ANGIOGRAM;  Surgeon: Larey Dresser, MD;  Location: [facility] C[facility] LAB;  Service: Cardiovascular;  Laterality: N/A;    Family History  Problem Relation Age of Onset  . Alcohol abuse Mother   . Liver disease Mother   . Cirrhosis Mother   . Heart attack Mother     Social History   Social History  . Marital Status: Divorced    Spouse Name: N/A  . Number of Children: N/A  . Years of Education: N/A   Occupational History  . Not on file.   Social History Main Topics  . Smoking status: Former Smoker -- 0.25 packs/day for 41 years    Types: Cigarettes    Quit date: 12/08/2015  . Smokeless tobacco: Never Used  . Alcohol Use: 1.8 oz/week    1 Glasses of wine, 2 Cans of beer per week     Comment: occasional  . Drug Use: No  . Sexual Activity: Not Currently   Other Topics Concern  . Not on file   Social History Narrative       No Known Allergies  Prior to Admission medications   Medication Sig Start  Date End Date Taking? Authorizing Provider  allopurinol (ZYLOPRIM) 100 MG tablet Take 1 tablet (100 mg total) by mouth daily. 08/11/15  Yes Micheline Chapman, NP  aspirin EC 81 MG tablet Take 81 mg by mouth daily.   Yes Historical Provider, MD  calcitRIOL (ROCALTROL) 0.25 MCG capsule Take 0.25 mcg by mouth daily.    Yes Historical Provider, MD  carvedilol (COREG) 25 MG tablet TAKE 1 TABLET (25 MG TOTAL) BY MOUTH 2 (TWO) TIMES DAILY WITH A MEAL. 07/04/15  Yes Jolaine Artist, MD  folic acid (FOLVITE) 1 MG tablet TAKE 1 TABLET (1 MG TOTAL) BY MOUTH DAILY. 08/11/15  Yes Micheline Chapman, NP  furosemide (LASIX) 40 MG tablet TAKE 1&1/2 TABLET ONCE  DAILY 08/29/15  Yes Jolaine Artist, MD  guaiFENesin-dextromethorphan (ROBITUSSIN DM) 100-10 MG/5ML syrup Take 5 mLs by mouth every 4 (four) hours as needed for cough. 10/25/15  Yes [name], MD  hydrALAZINE (APRESOLINE) 50 MG tablet TAKE 1.5 TABLETS (75 MG TOTAL) BY MOUTH 3 (THREE) TIMES DAILY. 05/10/15  Yes Jolaine Artist, MD  isosorbide mononitrate (IMDUR) 60 MG 24 hr tablet TAKE 1 TABLET BY MOUTH EVERY DAY 12/16/15  Yes Jolaine Artist, MD  metolazone (ZAROXOLYN) 2.5 MG tablet TAKE 1 TABLET BY MOUTH AS NEEDED 05/31/15  Yes Jolaine Artist, MD  oxyCODONE (ROXICODONE) 5 MG immediate release tablet Take 1 tablet (5 mg total) by mouth every 4 (four) hours as needed for moderate pain or severe pain. 10/25/15  Yes [name], MD  polyethylene glycol powder (GLYCOLAX/MIRALAX) powder Take 17 g by mouth daily. 02/17/15  Yes Micheline Chapman, NP  benzonatate (TESSALON) 100 MG capsule Take 1 capsule (100 mg total) by mouth 3 (three) times daily as needed for cough. Patient not taking: Reported on 12/22/2015 10/25/15   [name], MD  hydroxyurea (HYDREA) 500 MG capsule TAKE ONE CAPSULE BY MOUTH DAILY . MAY TAKE WITH FOOD TO MINIMIZE GI SIDE EFFECTS Patient not taking: Reported on 10/25/2015 03/07/15   Micheline Chapman, NP   Hospital Medications:  . aspirin EC  81 mg Oral Daily  . calcitRIOL  0.25 mcg Oral Daily  . carvedilol  25 mg Oral BID WC  . folic acid  1 mg Oral Daily  . furosemide  40 mg Intravenous Q12H  . heparin  5,000 Units Subcutaneous Q8H  . hydrALAZINE  75 mg Oral Q8H  . isosorbide mononitrate  60 mg Oral Daily  . metolazone  2.5 mg Oral Daily  . polyethylene glycol  17 g Oral Daily    Review of systems complete and found to be negative unless listed above   Tele: He is not on telemetry at this time  Physical Exam: Blood pressure 126/77, pulse 88, temperature 97.7 F (36.5 C), temperature source Oral, resp. rate 20, height 5\' 11"  (1.803 m), weight  148 lb (67.132 kg), SpO2 92 %.    General: Well developed, well nourished, NAD  HEENT: OP clear, mucus membranes moist  SKIN: warm, dry. No rashes.  Neuro: No focal deficits  Musculoskeletal: Muscle strength 5/5 all ext  Psychiatric: Mood and affect normal  Neck: + JVD, no carotid bruits, no thyromegaly, no lymphadenopathy.  Lungs:Clear bilaterally, no wheezes, rhonci, crackles  Cardiovascular: Irreg irreg with no loud murmurs noted.   Abdomen:Soft. Bowel sounds present. Non-tender. Slightly distended Extremities: No lower extremity edema. Pulses are 2 + in the bilateral DP/PT.   Labs:   Lab Results  Component  Value Date   WBC 8.3 12/22/2015   HGB 6.0* 12/22/2015   HCT 16.7* 12/22/2015   MCV 89.3 12/22/2015   PLT 130* 12/22/2015     Recent Labs Lab 12/22/15 1233  NA 138  K 4.5  CL 109  CO2 21*  BUN 98*  CREATININE 4.09*  CALCIUM 8.8*  GLUCOSE 97   Chest x-ray:  COPD with pulmonary hyperinflation Cardiac enlargement. Pulmonary venous congestion. Small pleural effusions consistent with fluid overload. Slight interstitial edema. Negative for pneumonia. No mass lesion. IMPRESSION: COPD Mild fluid overload with early interstitial edema and small pleural Effusions.  EKG: Atrial fibrillation, rate 85 bpm. Poor R wave progression precordial leads.   ASSESSMENT AND PLAN:   1. Acute on chronic systolic CHF/Non-ischemic cardiomyopathy: Pt admitted with dyspnea and abdominal distention. He is up at least 5 lbs from his dry weight. He appears to be volume overloaded. He is also profoundly anemic and is now in atrial fibrillation. His renal function has worsened. Agree with attempt at gentle diuresis tonight with IV Lasix, carefully following renal function. Echo has been ordered by the primary team. I will ask the CHF team to see him tomorrow.   2. Anemia: Transfusion per primary team  3. Atrial fibrillation, new onset: This is a new problem. I cannot find where he has had  atrial fibrillation int he past. Will order telemetry now. Will repeat EKG in am. Will not start anti-coagulation tonight with anemia. His rate is currently controlled.    Signed: Lauree Chandler, MD 12/22/2015, 6:22 PM

## 2015-12-27 NOTE — CV Procedure (Signed)
    PROCEDURE NOTE:  Procedure:  Transesophageal echocardiogram Operator:  Fransico Him, MD Indications:  Mitral valve mass Complications: None  During this procedure the patient is administered a total of Versed 4 mg and Fentanyl 50 mg to achieve and maintain moderate conscious sedation.  The patient's heart rate, blood pressure, and oxygen saturation are monitored continuously during the procedure. The period of conscious sedation is 25 minutes, of which I was present face-to-face 100% of this time.  Results: Normal LV size with mild LV dysfunction with EF 40-45%.   Normal RV size and function Normal RA Mildly dilated LA with normal LA cavity and LA appendage with no thrombus. Normal TV with mild TR Normal PV with trivial PR There is myxomatous thickening of the anterior MV leaflet with mild intermittent mitral valve prolapse with moderate mitral regurgitation directed eccentrically towards the posterior LA wall.  In some views there appears to be multiple jets.  There is also a rounded density that is very bright and very symmetrical measuring 1.7cm x 2.3cm. Normal trileaflet AV with trivial AR Normal interatrial septum with no evidence of shunt by colorflow dopper or agitated saline contrast injection. Normal thoracic and ascending aorta.  The patient tolerated the procedure well and was transferred back to their room in stable condition.  Signed: Fransico Him, MD [facility]

## 2015-12-27 NOTE — Discharge Summary (Addendum)
Patrick [name] MRN: [id number] DOB/AGE: [birth year] 60 y.o.  Admit date: 12/22/2015 Discharge date: 12/27/2015  Primary Care Physician:  Angelica Chessman, MD   Discharge Diagnoses:   Patient Active Problem List   Diagnosis Date Noted  . 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  . Atrial fibrillation ([place]) 12/24/2015  . AKI (acute kidney injury) ([place]) 12/22/2015  . Acute on chronic diastolic congestive heart failure ([place])   . Chronic kidney disease (CKD), stage IV (severe) ([place])   . Acute on chronic systolic heart failure ([place])   . Cough with sputum 10/25/2015  . Acute upper respiratory infection 10/25/2015  . Muscle tightness-Right arm  05/27/2014  . Arm pain, right 05/06/2014  . End stage renal disease ([place]) 05/06/2014  . Chest pain 01/13/2014  . Vitamin D deficiency 09/25/2013  . CKD (chronic kidney disease), stage IV ([place]) 09/25/2013  . Elevated uric acid in blood 09/25/2013  . Hb-SS disease without crisis ([place]) 09/25/2013  . Anemia 09/25/2013  . Back pain, acute 08/27/2013  . Onychomycosis of toenail 08/27/2013  . Cellulitis 08/27/2013  . Hyperglycemia 08/27/2013  . CHF (congestive heart failure) ([place]) 08/19/2013  . Leukocytosis, unspecified 02/07/2013  . Sickle cell anemia ([place]) 11/21/2012  . Chronic CHF ([place]) 01/17/2012  . Chronic diastolic heart failure ([place]) 10/29/2011  . Chronic systolic heart failure ([place]) 10/29/2011  . Tobacco abuse 10/29/2011  . NSVT (nonsustained ventricular tachycardia) ([place]) 10/21/2011  . Gout 10/19/2011  . Acute on chronic combined systolic and diastolic CHF (congestive heart failure) ([place]) 10/17/2011  . Acute kidney failure ([place]) 10/17/2011  . HTN (hypertension) 06/07/2011    DISCHARGE MEDICATION:   Medication List    STOP taking these medications        furosemide 40 MG tablet  Commonly known as:  LASIX     guaiFENesin-dextromethorphan 100-10 MG/5ML syrup  Commonly known as:  ROBITUSSIN DM     metolazone 2.5 MG tablet  Commonly  known as:  ZAROXOLYN      TAKE these medications        allopurinol 100 MG tablet  Commonly known as:  ZYLOPRIM  Take 1 tablet (100 mg total) by mouth daily.     aspirin EC 81 MG tablet  Take 81 mg by mouth daily.     benzonatate 100 MG capsule  Commonly known as:  TESSALON  Take 1 capsule (100 mg total) by mouth 3 (three) times daily as needed for cough.     calcitRIOL 0.25 MCG capsule  Commonly known as:  ROCALTROL  Take 0.25 mcg by mouth daily.     carvedilol 12.5 MG tablet  Commonly known as:  COREG  Take 1 tablet (12.5 mg total) by mouth 2 (two) times daily with a meal.     folic acid 1 MG tablet  Commonly known as:  FOLVITE  TAKE 1 TABLET (1 MG TOTAL) BY MOUTH DAILY.     hydrALAZINE 50 MG tablet  Commonly known as:  APRESOLINE  TAKE 1.5 TABLETS (75 MG TOTAL) BY MOUTH 3 (THREE) TIMES DAILY.     hydroxyurea 500 MG capsule  Commonly known as:  HYDREA  TAKE ONE CAPSULE BY MOUTH DAILY . MAY TAKE WITH FOOD TO MINIMIZE GI SIDE EFFECTS     isosorbide mononitrate 60 MG 24 hr tablet  Commonly known as:  IMDUR  TAKE 1 TABLET BY MOUTH EVERY DAY     oxyCODONE 5 MG immediate release tablet  Commonly known as:  ROXICODONE  Take 1 tablet (5 mg total) by  mouth every 4 (four) hours as needed for moderate pain or severe pain.     polyethylene glycol powder powder  Commonly known as:  GLYCOLAX/MIRALAX  Take 17 g by mouth daily.     torsemide 100 MG tablet  Commonly known as:  DEMADEX  Take 1 tablet (100 mg total) by mouth daily.     warfarin 5 MG tablet  Commonly known as:  COUMADIN  Take 1 tablet (5 mg total) by mouth daily at 6 PM.          Consults: Treatment Team:  Rounding [facility], MD Jamal Maes, MD   SIGNIFICANT DIAGNOSTIC STUDIES:  Dg Chest 2 View  12/24/2015  CLINICAL DATA:  Shortness of breath since yesterday, former smoker, recently stopped, history sickle cell disease, stage III chronic kidney disease, CHF EXAM: CHEST  2 VIEW COMPARISON:  With  12/22/2015 FINDINGS: Enlargement of cardiac silhouette with pulmonary vascular congestion. Mediastinal contours normal. Lungs emphysematous but clear. Tiny bibasilar effusions. No pneumothorax. IMPRESSION: Enlargement of cardiac silhouette with pulmonary vascular congestion. Emphysematous changes with tiny bibasilar effusions. Electronically Signed   By: Lavonia Dana M.D.   On: 12/24/2015 08:47   Dg Chest 2 View  12/22/2015  CLINICAL DATA:  Short of breath. Sickle cell. History of hypertension, kidney failure, CHF EXAM: CHEST  2 VIEW COMPARISON:  01/13/2014 FINDINGS: COPD with pulmonary hyperinflation Cardiac enlargement. Pulmonary venous congestion. Small pleural effusions consistent with fluid overload. Slight interstitial edema. Negative for pneumonia.  No mass lesion. IMPRESSION: COPD Mild fluid overload with early interstitial edema and small pleural effusions. Electronically Signed   By: Franchot Gallo M.D.   On: 12/22/2015 11:46   Dg Abd 1 View  12/22/2015  CLINICAL DATA:  Abdominal swelling for 3 weeks with pain for 4 days. Constipation and shortness of breath. Chronic kidney disease. Sickle cell disease. EXAM: ABDOMEN - 1 VIEW COMPARISON:  02/07/2013 ultrasound.  CT 02/07/2013 FINDINGS: Single supine view abdomen and pelvis. A gallstone measures 1.8 cm. Non-obstructive bowel gas pattern. No calcific densities over the kidneys. No appendicolith. Probable phleboliths in the left hemipelvis. Probable renal osteodystrophy, with heterogeneous increased marrow density throughout. IMPRESSION: No evidence of bowel obstruction or free intraperitoneal air. Cholelithiasis. Electronically Signed   By: Abigail Miyamoto M.D.   On: 12/22/2015 16:19     ECHO:   Left ventricle: The cavity size was mildly dilated. Systolic  function was mildly to moderately reduced. The estimated ejection  fraction was 40%. Diffuse hypokinesis. There is severe  hypokinesis of the entireinferior myocardium. The study was not   technically sufficient to allow evaluation of LV diastolic  dysfunction due to atrial fibrillation. - Aortic valve: Moderate diffuse thickening and calcification. - Mitral valve: The anterior mitral valve leaflet appears thickened  and in some views there appears to be a mass on the anterior  mitral valve leaflet measuring 2.2 x 1.32cm. It is not mobile.  There is calcification and thickening of the chordae tendinae.  Recommend TEE for further evaluation. There is moderate mitral  regurgitation that is eccentrically directed towards the LA free  wall. Calcified annulus. - Left atrium: The atrium was severely dilated. - Right ventricle: The cavity size was mildly dilated. Wall  thickness was normal. - Right atrium: The atrium was severely dilated. - Tricuspid valve: There was moderate regurgitation. - Pulmonary arteries: PA peak pressure: 55 mm Hg (S). - Recommendations: Consider transesophageal echocardiography to  assess mitral valve  Impressions:  - The right ventricular systolic pressure was increased  consistent  with moderate pulmonary hypertension.  Recommendations: Consider transesophageal echocardiography to assess mitral valve  TEE: Results: Normal LV size with mild LV dysfunction with EF 40-45%.  Normal RV size and function Normal RA Mildly dilated LA with normal LA cavity and LA appendage with no thrombus. Normal TV with mild TR Normal PV with trivial PR There is myxomatous thickening of the anterior MV leaflet with mild intermittent mitral valve prolapse with moderate mitral regurgitation directed eccentrically towards the posterior LA wall. In some views there appears to be multiple jets. There is also a rounded density that is very bright and very symmetrical measuring 1.7cm x 2.3cm. Normal trileaflet AV with trivial AR Normal interatrial septum with no evidence of shunt by colorflow dopper or agitated saline contrast injection. Normal thoracic and  ascending aorta.  No results found for this or any previous visit (from the past 240 hour(s)).  BRIEF ADMITTING H & P: Pt is known to me from previous care as a opiate naive patient with Hb Hayes who presents with c/o difficulty breathing x 3 weeks which has progressively worsened. He states that he noted that his abdomen was "bloating" about 10 days ago so he increased his Lasix about 1 week ago. His abdomen continued to bloat and he developed difficulty breathing in the last 24 hours. Thus he came to the ED. He reports decreased U/O, and feeling fatigued. He denies any fevers, chills, nausea, vomiting or diarrhea.   In the ED he was found to have a Cr. Of 4.09 and Hb of 6.0. Saturations 92-100/5 on RA. CXR shows findings consistent with pulmonary edema.    Hospital Course:  Present on Admission:  This is a 60 year old gentleman with a history of chronic kidney disease stage IV, chronic systolic heart failure and hemoglobin SS disease with anemia of chronic disease, and who is also legally blind. He presented to the emergency room in a state of acute on chronic systolic heart failure with volume overload and pulmonary congestion. The patient also had an acute on chronic kidney injury present. Evaluation in the emergency department showed that the patient had new onset atrial fibrillation which was likely contributory to his fluid overload. Due to his worsening renal failure diuresis was started in a very gentle manner. The patient also received a transfusion of one unit of rbc's. A 2-D echocardiogram was obtained to evaluate left ventricular function and both cardiology and nephrology were consulted. Additionally the patient was maintained on his beta blocker for rate control and initiated on Coumadin for anticoagulation for primary stroke prevention. Once the echocardiogram was obtained diuresis was ramped up to be more aggressive and the patient lost a total of 13 pounds during this hospitalization.  Findings on the TTE included a possible myxomatous mass. Thus at TEE was performed. There was a finding of a mass which according to Dr. Aundra Dubin was felt to be a calcification. Concurrently he also had an increase in his BUN/creatinine which is followed closely by nephrology. With regard to his sickle cell disease, the patient has somehow been taken off of hydroxyurea and I have restarted the hydroxyurea at 500 mg daily. He is to follow-up with his primary care physician to further titrate that hydroxyurea to the maximum tolerated dose. (Of note while the patient was on hydroxyurea he was able to maintain his hemoglobin at around 8).  At time of discharge he is sent home on torsemide daily. Zaroxolyn was received in the hospital has been held and nephrology will  follow-up for laboratory studies to determine resumption of the medication. With regard to his heart failure he is discharged home on Cora 12.5 mg twice a day as well as hydralazine 75 mg every 8 hours and Imdur 60 mg daily. At the time of discharge his INR subtherapeutic and he is to receive 7.5 mg of Coumadin today and then hollow up with 5 mg starting tomorrow. He's to follow-up in the Coumadin clinic for further titration on 12/29/2015. And follow-up appointments have been scheduled for cardiology heart failure clinic. On admission the patient was requiring oxygen however the time of discharge the patient is ambulatory without any need for oxygen, his hemoglobin is at 7.4 g/dL, he has a normal work of breathing, and reports that he feels back to his baseline. His heart rate is well controlled at a rate of 62 with an adequate blood pressure of 101/60. Oxygen saturations are 97% on room air and 96% on room air with ambulation.  Disposition and Follow-up:  Patient is discharged home in good and stable condition. He's to follow-up with appointments as noted in his after visit summary.     Discharge Instructions    (HEART FAILURE PATIENTS) Call MD:   Anytime you have any of the following symptoms: 1) 3 pound weight gain in 24 hours or 5 pounds in 1 week 2) shortness of breath, with or without a dry hacking cough 3) swelling in the hands, feet or stomach 4) if you have to sleep on extra pillows at night in order to breathe.    Complete by:  As directed      AMB Referral to HF Clinic    Complete by:  As directed      Call MD for:  persistant dizziness or light-headedness    Complete by:  As directed   Call Dr. Lorrene Reid or Dr. Clayborne Dana office     Call MD for:  persistant nausea and vomiting    Complete by:  As directed   Call Dr. Sanda Klein office     Diet - low sodium heart healthy    Complete by:  As directed   Renal- Heart Healthy Diet     Increase activity slowly    Complete by:  As directed            DISCHARGE EXAM:  General: Alert, awake, oriented x3, in no apparent distress.  HEENT: [place]/AT PEERL, EOMI, anicteric Neck: Trachea midline, no masses, no thyromegal,y no JVD, no carotid bruit OROPHARYNX: Moist, No exudate/ erythema/lesions.  Heart: Regular rate and rhythm, without murmurs, rubs, gallops or S3. PMI non-displaced. Exam reveals no decreased pulses. Pulmonary/Chest: Normal effort. Breath sounds normal. No. Apnea. Clear to auscultation,no stridor,  no wheezing and no rhonchi noted. No respiratory distress and no tenderness noted. Abdomen: Soft, nontender, nondistended, normal bowel sounds, no masses no hepatosplenomegaly noted. No fluid wave and no ascites. There is no guarding or rebound. Neuro: Alert and oriented to person, place and time. Normal motor skills, Displays no atrophy or tremors and exhibits normal muscle tone.  No focal neurological deficits noted cranial nerves II through XII grossly intact. No sensory deficit noted.  Strength at baseline in bilateral upper and lower extremities. Gait normal. Musculoskeletal: No warmth swelling or erythema around joints, no spinal tenderness noted. Psychiatric: Patient  alert and oriented x3, good insight and cognition, good recent to remote recall. Mood, memory, affect and judgement normal Lymph node survey: No cervical axillary or inguinal lymphadenopathy noted. Skin: Skin is warm and  dry. No bruising, no ecchymosis and no rash noted. Pt is not diaphoretic. No erythema. No pallor    Blood pressure 101/62, pulse 62, temperature 97.8 F (36.6 C), temperature source Oral, resp. rate 12, height 5\' 11"  (1.803 m), weight 135 lb 5.8 oz (61.4 kg), SpO2 97 % on RA.   Recent Labs  12/26/15 1022 12/27/15 0942  NA 133* 134*  K 3.7 3.9  CL 95* 98*  CO2 25 26  GLUCOSE 144* 94  BUN 111* 118*  CREATININE 4.07* 4.05*  CALCIUM 9.0 8.8*  PHOS 7.0* 7.2*    Recent Labs  12/26/15 1022 12/27/15 0942  ALBUMIN 3.5 3.7   No results for input(s): LIPASE, AMYLASE in the last 72 hours.  Recent Labs  12/26/15 0932 12/27/15 0942  WBC 4.7 5.6  NEUTROABS 3.1 3.8  HGB 7.3* 7.4*  HCT 20.4* 21.2*  MCV 89.5 90.2  PLT 107* 102*     Total time spent including face to face and decision making was greater than 30 minutes  Signed: Arthi Mcdonald A. 12/27/2015, 3:07 PM

## 2015-12-27 NOTE — Interval H&P Note (Signed)
History and Physical Interval Note:  12/27/2015 8:11 AM  Patrick [name]  has presented today for surgery, with the diagnosis of BACTERIA   The various methods of treatment have been discussed with the patient and family. After consideration of risks, benefits and other options for treatment, the patient has consented to  Procedure(s): TRANSESOPHAGEAL ECHOCARDIOGRAM (TEE) (N/A) as a surgical intervention .  The patient's history has been reviewed, patient examined, no change in status, stable for surgery.  I have reviewed the patient's chart and labs.  Questions were answered to the patient's satisfaction.     Fransico Him

## 2015-12-27 NOTE — Progress Notes (Signed)
Admit: 12/22/2015 LOS: 5  49M CKD4 (mature AVF, Dunham CKA, BL SCr 3.3) admith w/ AoC sCHF 2/2 NICM, AoC Anemia 2/2 SCA, New onset AFib  Subjective:  TEE negative for viegetation Feels well, no complaints For discharge later today  07/10 0701 - 07/11 0700 In: 540 [P.O.:540] Out: -   Filed Weights   12/25/15 0555 12/26/15 0601 12/27/15 0606  Weight: 66.089 kg (145 lb 11.2 oz) 64.5 kg (142 lb 3.2 oz) 61.4 kg (135 lb 5.8 oz)    Scheduled Meds: . sodium chloride   Intravenous Once  . aspirin EC  81 mg Oral Daily  . calcitRIOL  0.25 mcg Oral Daily  . carvedilol  12.5 mg Oral BID WC  . folic acid  1 mg Oral Daily  . heparin  5,000 Units Subcutaneous Q8H  . hydrALAZINE  75 mg Oral Q8H  . isosorbide mononitrate  60 mg Oral Daily  . metolazone  2.5 mg Oral Daily  . polyethylene glycol  17 g Oral Daily  . torsemide  100 mg Oral Daily  . warfarin  5 mg Oral q1800  . warfarin   Does not apply Once  . Warfarin - Pharmacist Dosing Inpatient   Does not apply q1800   Continuous Infusions:  PRN Meds:.oxyCODONE, polyethylene glycol  Current Labs: reviewed    Physical Exam:  Blood pressure 104/79, pulse 65, temperature 98 F (36.7 C), temperature source Oral, resp. rate 16, height 5\' 11"  (1.803 m), weight 61.4 kg (135 lb 5.8 oz), SpO2 92 %. NAD IRIR CTAB No LEE BLind in eyes S/nt/nd No c/c/e RUE AVF +B/T, mature  A 1. AoCKD4 2/2 most likely to sHF exacerbation vs progressive CKD 1. Mature AVF RUE BVT 2. Dunham CKA, SCr was 3.3 11/2015 3. Stable as inpatient around 4.0 2. AoC sHF exacerbation, NICM: LVEF 40% 1. Some UOP response to loop diuretic + metolazone 2. AHF following 3. TEE neg for vegetation 3. New onset AFib, rate controlled, now on warfarin 4. Anemia / Sickle Cell Anemia 1. S/p 2u PRBC during admission 5. Macular degeneration / blindness  P 1. Hold metolazone given progressive azotemia 2. Cont torsemide 100mg  qAM 3. Will arrange labs at [facility] this week and f/u  with Lorrene Reid in near future 4. No need for HD; clinically much improved  Pearson Grippe MD 12/27/2015, 12:44 PM   Recent Labs Lab 12/24/15 0940 12/25/15 0750 12/26/15 1022 12/27/15 0942  NA 134* 135 133* 134*  K 4.0 4.0 3.7 3.9  CL 102 101 95* 98*  CO2 22 23 25 26   GLUCOSE 152* 145* 144* 94  BUN 99* 103* 111* 118*  CREATININE 4.09* 4.18* 4.07* 4.05*  CALCIUM 8.8* 9.1 9.0 8.8*  PHOS 6.1*  --  7.0* 7.2*    Recent Labs Lab 12/24/15 0543 12/25/15 0750 12/26/15 0932 12/27/15 0942  WBC 6.7 6.2 4.7 5.6  NEUTROABS 4.4  --  3.1 3.8  HGB 7.4* 7.6* 7.3* 7.4*  HCT 20.4* 21.9* 20.4* 21.2*  MCV 87.6 90.5 89.5 90.2  PLT 123* 119* 107* 102*

## 2015-12-28 DIAGNOSIS — I5022 Chronic systolic (congestive) heart failure: Secondary | ICD-10-CM | POA: Diagnosis not present

## 2015-12-28 DIAGNOSIS — I130 Hypertensive heart and chronic kidney disease with heart failure and stage 1 through stage 4 chronic kidney disease, or unspecified chronic kidney disease: Secondary | ICD-10-CM | POA: Diagnosis not present

## 2015-12-28 DIAGNOSIS — D631 Anemia in chronic kidney disease: Secondary | ICD-10-CM | POA: Diagnosis not present

## 2015-12-28 DIAGNOSIS — E211 Secondary hyperparathyroidism, not elsewhere classified: Secondary | ICD-10-CM | POA: Diagnosis not present

## 2015-12-28 DIAGNOSIS — I420 Dilated cardiomyopathy: Secondary | ICD-10-CM | POA: Diagnosis not present

## 2015-12-28 DIAGNOSIS — N184 Chronic kidney disease, stage 4 (severe): Secondary | ICD-10-CM | POA: Diagnosis not present

## 2015-12-29 ENCOUNTER — Ambulatory Visit (INDEPENDENT_AMBULATORY_CARE_PROVIDER_SITE_OTHER): Payer: PPO | Admitting: *Deleted

## 2015-12-29 DIAGNOSIS — I4819 Other persistent atrial fibrillation: Secondary | ICD-10-CM

## 2015-12-29 DIAGNOSIS — I059 Rheumatic mitral valve disease, unspecified: Secondary | ICD-10-CM | POA: Diagnosis not present

## 2015-12-29 DIAGNOSIS — Z5181 Encounter for therapeutic drug level monitoring: Secondary | ICD-10-CM | POA: Insufficient documentation

## 2015-12-29 DIAGNOSIS — I058 Other rheumatic mitral valve diseases: Secondary | ICD-10-CM

## 2015-12-29 DIAGNOSIS — I481 Persistent atrial fibrillation: Secondary | ICD-10-CM

## 2015-12-29 LAB — POCT INR: INR: 1.8

## 2015-12-29 NOTE — Patient Instructions (Signed)

## 2016-01-02 DIAGNOSIS — Z720 Tobacco use: Secondary | ICD-10-CM | POA: Diagnosis not present

## 2016-01-02 DIAGNOSIS — I770 Arteriovenous fistula, acquired: Secondary | ICD-10-CM | POA: Diagnosis not present

## 2016-01-02 DIAGNOSIS — N184 Chronic kidney disease, stage 4 (severe): Secondary | ICD-10-CM | POA: Diagnosis not present

## 2016-01-02 DIAGNOSIS — D571 Sickle-cell disease without crisis: Secondary | ICD-10-CM | POA: Diagnosis not present

## 2016-01-02 DIAGNOSIS — I5020 Unspecified systolic (congestive) heart failure: Secondary | ICD-10-CM | POA: Diagnosis not present

## 2016-01-02 DIAGNOSIS — I129 Hypertensive chronic kidney disease with stage 1 through stage 4 chronic kidney disease, or unspecified chronic kidney disease: Secondary | ICD-10-CM | POA: Diagnosis not present

## 2016-01-02 DIAGNOSIS — M109 Gout, unspecified: Secondary | ICD-10-CM | POA: Diagnosis not present

## 2016-01-02 DIAGNOSIS — I4891 Unspecified atrial fibrillation: Secondary | ICD-10-CM | POA: Diagnosis not present

## 2016-01-02 DIAGNOSIS — N2581 Secondary hyperparathyroidism of renal origin: Secondary | ICD-10-CM | POA: Diagnosis not present

## 2016-01-02 DIAGNOSIS — D649 Anemia, unspecified: Secondary | ICD-10-CM | POA: Diagnosis not present

## 2016-01-05 ENCOUNTER — Ambulatory Visit (INDEPENDENT_AMBULATORY_CARE_PROVIDER_SITE_OTHER): Payer: PPO | Admitting: *Deleted

## 2016-01-05 DIAGNOSIS — I059 Rheumatic mitral valve disease, unspecified: Secondary | ICD-10-CM

## 2016-01-05 DIAGNOSIS — I481 Persistent atrial fibrillation: Secondary | ICD-10-CM

## 2016-01-05 DIAGNOSIS — Z5181 Encounter for therapeutic drug level monitoring: Secondary | ICD-10-CM | POA: Diagnosis not present

## 2016-01-05 DIAGNOSIS — I4819 Other persistent atrial fibrillation: Secondary | ICD-10-CM

## 2016-01-05 DIAGNOSIS — I058 Other rheumatic mitral valve diseases: Secondary | ICD-10-CM

## 2016-01-05 LAB — POCT INR: INR: 3.2

## 2016-01-06 ENCOUNTER — Other Ambulatory Visit: Payer: Self-pay | Admitting: *Deleted

## 2016-01-06 DIAGNOSIS — I4891 Unspecified atrial fibrillation: Secondary | ICD-10-CM

## 2016-01-06 DIAGNOSIS — I5042 Chronic combined systolic (congestive) and diastolic (congestive) heart failure: Secondary | ICD-10-CM

## 2016-01-06 NOTE — Patient Outreach (Signed)
Patrick [name]) Care Management  01/06/2016  Patrick [name] 03-Jan-1956 [id number]  Referral from Patrick [name] care management: Recent [name] discharge 07/06-07/04/2016, has transportation needs.  Telephone call to patient who was advised of reason for call & of Patrick [name] care management services. HIPPA verification received from patient.  Patient voices that he was recently hospitalized at [name] when he started having difficulty breathing. States he has problems with heart failure, kidney disease (has fistula in arm), sickle cell anemia,  irregular heart beat.   Patient voices that he has completed visits with kidney specialist _Dr,Dunham 07/13, coumadin clinic -7/20. States has appointment with heart specialist-Patrick [name] in August.  States he has appointment with primary care provider but plans to try to get in earlier to see him since [name] stay.    Patient voices that he has talking scales and is weighing himself daily.  Patient is aware of heart failure action plan & knows when to call MD office or 911.   Patient voices that he manages his own medications and uses [facility] to get prescriptions [facility]. Voices that he is legally blind (hx macular degeneration) & uses large magnifying glass to read instructions on medication bottles. States he is taking medications as ordered by doctors consistently and understands importance of doing so..   Patient voices that he uses taxi service to get to MD appointments & coumadin clinic but wishes to find something more affordable for transportation. States having  to go to coumadin clinic weekly currently.   Patient consents to [facility] care management services.  Plan: Send to care management assistant to refer to [name] for complex case management of patient with recent [name] discharge  & referral to Clinical Social Worker for transportation & community resources.   Patrick Daisy, RN BSN  [place] Management Coordinator [facility] Care Management  [phone]

## 2016-01-09 ENCOUNTER — Other Ambulatory Visit: Payer: Self-pay | Admitting: Licensed Clinical Social Worker

## 2016-01-09 ENCOUNTER — Other Ambulatory Visit: Payer: Self-pay

## 2016-01-09 ENCOUNTER — Encounter: Payer: Self-pay | Admitting: Licensed Clinical Social Worker

## 2016-01-09 NOTE — Patient Outreach (Signed)
[facility]) Care Management  01/09/2016  Max D Magaro May 15, 1956 [id number]  60 year old with recent admission for heart failure,acute kidney injury and new onset atrial fibrillation. RNCM called for transition of care. No answer. HIPPA compliant message left.  Plan: continue to call to reach member and complete transition of care.  Thea Silversmith, RN, MSN, [place] Coordinator Cell: [phone]

## 2016-01-09 NOTE — Patient Outreach (Signed)
[facility]) Care Management  01/09/2016  Patrick [name] 1956-02-21 [id number]   Assessment- CSW completed initial outreach to patient after receiving new referral for transportation resources. Patient answered and provided two HIPPA identifications. CSW introduced self, reason for call and of [facility] services. Patient agreeable to social work services. Patient states that he is paying cabs to go to his physician appointments and coumadin clinic appointments and he is in need of something more affordable. CSW spent time educating patient on available transportation resources within [facility]. Patient is agreeable to home visit tomorrow at 11 am. CSW questioned if there were any other resource needs that he had and he declined.  Plan-CSW will complete home visit tomorrow and send involvement letter to PCP.  Patrick [name], BSW, MSW, [name] Phone: [phone] Fax: [phone]

## 2016-01-10 ENCOUNTER — Encounter: Payer: Self-pay | Admitting: Licensed Clinical Social Worker

## 2016-01-10 ENCOUNTER — Other Ambulatory Visit: Payer: Self-pay | Admitting: Licensed Clinical Social Worker

## 2016-01-10 ENCOUNTER — Other Ambulatory Visit: Payer: Self-pay

## 2016-01-10 NOTE — Patient Outreach (Signed)
[facility]) Care Management  [facility] Social Work  01/10/2016  ABED CROXFORD [birth year] [id number]   Encounter Medications:  Outpatient Encounter Prescriptions as of 01/10/2016  Medication Sig  . allopurinol (ZYLOPRIM) 100 MG tablet Take 1 tablet (100 mg total) by mouth daily.  Marland Kitchen aspirin EC 81 MG tablet Take 81 mg by mouth daily.  . benzonatate (TESSALON) 100 MG capsule Take 1 capsule (100 mg total) by mouth 3 (three) times daily as needed for cough. (Patient not taking: Reported on 12/22/2015)  . calcitRIOL (ROCALTROL) 0.25 MCG capsule Take 0.25 mcg by mouth daily.   . carvedilol (COREG) 12.5 MG tablet Take 1 tablet (12.5 mg total) by mouth 2 (two) times daily with a meal.  . folic acid (FOLVITE) 1 MG tablet TAKE 1 TABLET (1 MG TOTAL) BY MOUTH DAILY.  . hydrALAZINE (APRESOLINE) 50 MG tablet TAKE 1.5 TABLETS (75 MG TOTAL) BY MOUTH 3 (THREE) TIMES DAILY.  . hydroxyurea (HYDREA) 500 MG capsule TAKE ONE CAPSULE BY MOUTH DAILY . MAY TAKE WITH FOOD TO MINIMIZE GI SIDE EFFECTS  . isosorbide mononitrate (IMDUR) 60 MG 24 hr tablet TAKE 1 TABLET BY MOUTH EVERY DAY  . oxyCODONE (ROXICODONE) 5 MG immediate release tablet Take 1 tablet (5 mg total) by mouth every 4 (four) hours as needed for moderate pain or severe pain.  . polyethylene glycol powder (GLYCOLAX/MIRALAX) powder Take 17 g by mouth daily.  Marland Kitchen torsemide (DEMADEX) 100 MG tablet Take 1 tablet (100 mg total) by mouth daily.  Marland Kitchen warfarin (COUMADIN) 5 MG tablet Take 1 tablet (5 mg total) by mouth daily at 6 PM.   No facility-administered encounter medications on file as of 01/10/2016.     Functional Status:  In your present state of health, do you have any difficulty performing the following activities: 12/22/2015 08/11/2015  Hearing? N -  Vision? Y Y  Difficulty concentrating or making decisions? N -  Walking or climbing stairs? N -  Dressing or bathing? N -  Doing errands, shopping? Y -  Some recent data might be hidden     Fall/Depression Screening:  PHQ 2/9 Scores 01/10/2016 01/06/2016 10/25/2015 09/20/2015 08/11/2015 03/03/2015 02/17/2015  PHQ - 2 Score 2 2 0 0 0 0 0  PHQ- 9 Score 2 3 - - - - -    Assessment: CSW completed initial home visit today on 01/10/16. Patient is a 60 year old divorced male with several health conditions and a recent hospitalization. Patient is legally blind and is in need of transportation resources. CSW completed assessment today. Patient denies being in any pain or discomfort. Patient shares that he has both an advance directive with HCPOA and living will. Patient admits to having "some" symptoms of depression but does not accept mental health resources stating "It isn't bad." Patient shares that his main support network consist of his aunt and cousin. He shares that he has been paying $11 dollars each way to go to his physician appointments and to the coumadin clinic. He reports that he goes to these appointments alone but has his cousin go with him to the grocery store. Patient denies needing financial resources or food pantry information stating "I get by pretty well. I just need to stop paying for a cab when I go to the doctor." Patient was receptive to resources on DSS Services of the Legally Blind. He was encouraged to contact social worker Baird Kay to set up appointment. Patient already has a reader but could benefit from other  resources that they provide. CSW provided information on transportation resources and patient wishes to gain SCAT services so that he can have transportation to both non medical and medical appointments. CSW completed SCAT application for patient and will fax application to agency. CSW educated him on the process of becoming eligible for services. Patient is agreeable to next outreach in two weeks to follow up on SCAT.   Plan: CSW will route encounter to PCP. CSW will fax completed SCAT application. CSW will follow up in two weeks or less.  [facility] CM Care Plan Problem  One   Flowsheet Row Most Recent Value  Care Plan Problem One  Lack of stable transportation  Role Documenting the Problem One  Clinical Social Worker  Care Plan for Problem One  Active  THN Long Term Goal (31-90 days)  Patient will gain stable transportation to physician appointments within 90 days as evidenced by having to use cabs to go to appointments and difficulty affording this  [place] Goal Start Date  01/09/16  Interventions for Problem One Long Term Goal  CSW will complete home visit with patient. CSW will educate patient on available transportation resources in [place]. CSW will apply for SCAT for patient and follow up with this process.  THN CM Short Term Goal #1 (0-30 days)  Patient will be educated on available transportation resources within 30 days as evidenced by his desire to learn more about transportation resources  [facility] CM Short Term Goal #1 Start Date  01/09/16  Interventions for Short Term Goal #1  CSW will complete home visit and provide handout information on transportation resources and spent time educating him on this information.  THN CM Short Term Goal #2 (0-30 days)  Patient will review available Services of the Blind benefits within 30 days and contact them as needed as evidenced by being legally blind  THN CM Short Term Goal #2 Start Date  01/10/16  Interventions for Short Term Goal #2  CSW completed home visit today and provided information and education. CSW will continue to review benefits with patient as needed and follow up.       Eula Fried, BSW, MSW, [email or web address] Phone: [phone] Fax: [phone]

## 2016-01-10 NOTE — Patient Outreach (Signed)
[place] [facility]) Care Management  01/10/2016  Patrick [name] June 12, 1956 [id number]   Referral received per telephonic Care Coordinatior. Member recently hospitalized with new onset atrial fibrillation, heart failure, acute kidney injury. No answer. HIPPA compliant message left on member's voice message.   Plan: RNCM will continue to try to reach member.  Thea Silversmith, RN, MSN, [place] Coordinator Cell: [phone]

## 2016-01-11 ENCOUNTER — Other Ambulatory Visit: Payer: Self-pay

## 2016-01-11 NOTE — Patient Outreach (Signed)
[facility]) Care Management  01/11/2016  Patrick [name] 03-10-1956 [id number]   Assessment: 60 year old with recent admission for heart failure, new atrial fibrillation. Member reports he is feeling better. Denies any questions at this time. member states he has follow up appointment scheduled with cardiologist next week.   History of heart failure-member reports he has scales and checks weight daily. Weight today 132 pounds. Signs/symptoms of heart failure exacerbation reviewed.   Member without questions or issues at this time.   Plan: home visit next week.  Thea Silversmith, RN, MSN, [place] Coordinator Cell: [phone]

## 2016-01-12 ENCOUNTER — Encounter (INDEPENDENT_AMBULATORY_CARE_PROVIDER_SITE_OTHER): Payer: Self-pay

## 2016-01-12 ENCOUNTER — Ambulatory Visit (INDEPENDENT_AMBULATORY_CARE_PROVIDER_SITE_OTHER): Payer: PPO | Admitting: *Deleted

## 2016-01-12 DIAGNOSIS — I481 Persistent atrial fibrillation: Secondary | ICD-10-CM | POA: Diagnosis not present

## 2016-01-12 DIAGNOSIS — Z5181 Encounter for therapeutic drug level monitoring: Secondary | ICD-10-CM

## 2016-01-12 DIAGNOSIS — N184 Chronic kidney disease, stage 4 (severe): Secondary | ICD-10-CM | POA: Diagnosis not present

## 2016-01-12 DIAGNOSIS — I058 Other rheumatic mitral valve diseases: Secondary | ICD-10-CM

## 2016-01-12 DIAGNOSIS — I4819 Other persistent atrial fibrillation: Secondary | ICD-10-CM

## 2016-01-12 DIAGNOSIS — I059 Rheumatic mitral valve disease, unspecified: Secondary | ICD-10-CM | POA: Diagnosis not present

## 2016-01-12 LAB — POCT INR: INR: 2.2

## 2016-01-13 ENCOUNTER — Other Ambulatory Visit: Payer: Self-pay | Admitting: Family Medicine

## 2016-01-19 ENCOUNTER — Other Ambulatory Visit: Payer: Self-pay

## 2016-01-19 NOTE — Patient Outreach (Signed)
[facility]) Care Management  [place]  01/19/2016   Patrick [name] [birth year] [id number]  Subjective: member reports doing good. Denies any issues or concerns at this time.  Objective: BP 100/62   Pulse 75   Resp 20   Ht 1.803 m (5\' 11" ) Comment: patient reported.  Wt 136 lb (61.7 kg) Comment: patient reported.  SpO2 99%   BMI 18.97 kg/m ,lungs decreased,    Encounter Medications:  Outpatient Encounter Prescriptions as of 01/19/2016  Medication Sig Note  . allopurinol (ZYLOPRIM) 100 MG tablet TAKE 1 TABLET BY MOUTH EVERY DAY   . aspirin EC 81 MG tablet Take 81 mg by mouth daily.   . calcitRIOL (ROCALTROL) 0.25 MCG capsule Take 0.25 mcg by mouth daily.    . carvedilol (COREG) 12.5 MG tablet Take 1 tablet (12.5 mg total) by mouth 2 (two) times daily with a meal.   . folic acid (FOLVITE) 1 MG tablet TAKE 1 TABLET (1 MG TOTAL) BY MOUTH DAILY.   . hydrALAZINE (APRESOLINE) 50 MG tablet TAKE 1.5 TABLETS (75 MG TOTAL) BY MOUTH 3 (THREE) TIMES DAILY. 01/19/2016: Taking Twice a day  . hydroxyurea (HYDREA) 500 MG capsule TAKE ONE CAPSULE BY MOUTH DAILY . MAY TAKE WITH FOOD TO MINIMIZE GI SIDE EFFECTS   . isosorbide mononitrate (IMDUR) 60 MG 24 hr tablet TAKE 1 TABLET BY MOUTH EVERY DAY   . torsemide (DEMADEX) 100 MG tablet Take 1 tablet (100 mg total) by mouth daily.   Marland Kitchen warfarin (COUMADIN) 5 MG tablet Take 1 tablet (5 mg total) by mouth daily at 6 PM.   . benzonatate (TESSALON) 100 MG capsule Take 1 capsule (100 mg total) by mouth 3 (three) times daily as needed for cough. (Patient not taking: Reported on 12/22/2015)   . oxyCODONE (ROXICODONE) 5 MG immediate release tablet Take 1 tablet (5 mg total) by mouth every 4 (four) hours as needed for moderate pain or severe pain. (Patient not taking: Reported on 01/11/2016)   . polyethylene glycol powder (GLYCOLAX/MIRALAX) powder Take 17 g by mouth daily. (Patient not taking: Reported on 01/11/2016)    No  facility-administered encounter medications on file as of 01/19/2016.     Functional Status:  In your present state of health, do you have any difficulty performing the following activities: 01/11/2016 12/22/2015  Hearing? N N  Vision? Y Y  Difficulty concentrating or making decisions? N N  Walking or climbing stairs? N N  Dressing or bathing? N N  Doing errands, shopping? N Y  Conservation officer, nature and eating ? N -  Using the Toilet? N -  In the past six months, have you accidently leaked urine? N -  Do you have problems with loss of bowel control? N -  Managing your Medications? N -  Managing your Finances? N -  Housekeeping or managing your Housekeeping? N -  Some recent data might be hidden    Fall/Depression Screening: PHQ 2/9 Scores 01/10/2016 01/06/2016 10/25/2015 09/20/2015 08/11/2015 03/03/2015 02/17/2015  PHQ - 2 Score 2 2 0 0 0 0 0  PHQ- 9 Score 2 3 - - - - -    Assessment: 60 year old with recent admission with new onset atrial fibrillation. History of heart failure, acute kidney injury. Member without complaints at this time. Member reports feeling better. Member has follow up appointments schedule and will see heart failure clinic tomorrow.  Medications reviewed. RNCM noted that member was taking both furosemide and torsemide. However per discharge  instructions, furosemide has been discontinued.Member put furosemide aside and verbalize understanding not to take furosemide. Member has a follow up appointment with the heart failure clinic tomorrow.  No other questions or concerns.  Plan: continue weekly transition of care calls. Update providers.  Thea Silversmith, RN, MSN, [place] Coordinator Cell: [phone]

## 2016-01-20 ENCOUNTER — Encounter (INDEPENDENT_AMBULATORY_CARE_PROVIDER_SITE_OTHER): Payer: Self-pay

## 2016-01-20 ENCOUNTER — Ambulatory Visit (INDEPENDENT_AMBULATORY_CARE_PROVIDER_SITE_OTHER): Payer: PPO | Admitting: Pharmacist

## 2016-01-20 ENCOUNTER — Ambulatory Visit (HOSPITAL_COMMUNITY)
Admit: 2016-01-20 | Discharge: 2016-01-20 | Disposition: A | Discharge status: Home / Self Care | Payer: PPO | Attending: Internal Medicine | Admitting: Internal Medicine

## 2016-01-20 VITALS — BP 122/70 | HR 77 | Wt 141.4 lb

## 2016-01-20 DIAGNOSIS — I4581 Long QT syndrome: Secondary | ICD-10-CM | POA: Insufficient documentation

## 2016-01-20 DIAGNOSIS — I4819 Other persistent atrial fibrillation: Secondary | ICD-10-CM

## 2016-01-20 DIAGNOSIS — I4891 Unspecified atrial fibrillation: Secondary | ICD-10-CM | POA: Insufficient documentation

## 2016-01-20 DIAGNOSIS — R0602 Shortness of breath: Secondary | ICD-10-CM | POA: Diagnosis not present

## 2016-01-20 DIAGNOSIS — N184 Chronic kidney disease, stage 4 (severe): Secondary | ICD-10-CM

## 2016-01-20 DIAGNOSIS — I429 Cardiomyopathy, unspecified: Secondary | ICD-10-CM | POA: Diagnosis not present

## 2016-01-20 DIAGNOSIS — I481 Persistent atrial fibrillation: Secondary | ICD-10-CM | POA: Diagnosis not present

## 2016-01-20 DIAGNOSIS — I5032 Chronic diastolic (congestive) heart failure: Secondary | ICD-10-CM

## 2016-01-20 DIAGNOSIS — I130 Hypertensive heart and chronic kidney disease with heart failure and stage 1 through stage 4 chronic kidney disease, or unspecified chronic kidney disease: Secondary | ICD-10-CM | POA: Diagnosis not present

## 2016-01-20 DIAGNOSIS — D571 Sickle-cell disease without crisis: Secondary | ICD-10-CM | POA: Diagnosis not present

## 2016-01-20 DIAGNOSIS — N183 Chronic kidney disease, stage 3 (moderate): Secondary | ICD-10-CM | POA: Diagnosis not present

## 2016-01-20 DIAGNOSIS — I5022 Chronic systolic (congestive) heart failure: Secondary | ICD-10-CM | POA: Insufficient documentation

## 2016-01-20 DIAGNOSIS — I440 Atrioventricular block, first degree: Secondary | ICD-10-CM | POA: Diagnosis not present

## 2016-01-20 DIAGNOSIS — Z7982 Long term (current) use of aspirin: Secondary | ICD-10-CM | POA: Insufficient documentation

## 2016-01-20 DIAGNOSIS — Z5181 Encounter for therapeutic drug level monitoring: Secondary | ICD-10-CM | POA: Diagnosis not present

## 2016-01-20 DIAGNOSIS — Z720 Tobacco use: Secondary | ICD-10-CM | POA: Diagnosis not present

## 2016-01-20 DIAGNOSIS — Z7901 Long term (current) use of anticoagulants: Secondary | ICD-10-CM | POA: Insufficient documentation

## 2016-01-20 DIAGNOSIS — I058 Other rheumatic mitral valve diseases: Secondary | ICD-10-CM

## 2016-01-20 DIAGNOSIS — I059 Rheumatic mitral valve disease, unspecified: Secondary | ICD-10-CM

## 2016-01-20 LAB — BASIC METABOLIC PANEL
Anion gap: 11 (ref 5–15)
BUN: 130 mg/dL — ABNORMAL HIGH (ref 6–20)
CO2: 22 mmol/L (ref 22–32)
Calcium: 8.6 mg/dL — ABNORMAL LOW (ref 8.9–10.3)
Chloride: 102 mmol/L (ref 101–111)
Creatinine, Ser: 4.2 mg/dL — ABNORMAL HIGH (ref 0.61–1.24)
GFR calc Af Amer: 17 mL/min — ABNORMAL LOW (ref >60)
GFR calc non Af Amer: 14 mL/min — ABNORMAL LOW (ref >60)
Glucose, Bld: 101 mg/dL — ABNORMAL HIGH (ref 65–99)
Potassium: 4 mmol/L (ref 3.5–5.1)
Sodium: 135 mmol/L (ref 135–145)

## 2016-01-20 LAB — POCT INR: INR: 2.1

## 2016-01-20 NOTE — Progress Notes (Signed)
Patient ID: Patrick [name], male   DOB: 13-Jan-1956, 60 y.o.   MRN: [id number]  PCP: Dr Anselm Pancoast Cell : Dr Zigmund Marylin Lathon  Opthalmologist: [name] HF MD: Dr Marquette Saa     HPI: Patrick [name] is a 60 y.o. African American male with a history of sickle cell anemia, systolic HF due to NICM EF 40% 11/2012 initially diagnosed 05/2011, no CAD by cath 05/2011, CKD stage III, legally blind, and tobacco abuse.   Admitted 58/3/14 from HF clinic with voume overload. Hemoglobin 6.7 Transfused 2 UPRBC. Diuresed with IV lasix. Lisinopril stopped due to CKD. Discharge weight 143 pounds.    Admitted 02/07/13 through 02/12/13 after being treated for N/V/D, and abdominal pain. CT of abdomen- cholelithias with plans to f/u for elective cholecystectomy. Received 2UPRBC.  Hemoglobin 6.1>7.1.  Diuretics held due to dehydration. He restarted lasix 60 mg bid 02/13/13.   Admitted to [facility] 3/4 through 08/22/13 with anemia and volume overload. Hemglobin was 5.9. Received 1U PRBC with hemoglobin increased to 6.4 Diuresed with IV lasix. Discharged on carvedilol 25 mg twice a day, lasix 80 mg twice a day, hydralazine 25 mg tid, and imdur 30 mg daily.  D/C weight was 140 pounds.  He is not on Spironolactone due to hyperkalemia. He is not on ace inhibitor due to renal failure.  Admitted 7/6 through [id number] with A/C systolic heart failure and CKD in setting of new A fib. Started coumadin. Discharge weight was 135 pounds.   He returns for post hospital follow. Overall feeling ok. SOB with inclines. Denies PND/orthopnea. Did not take meds this morning. Stopped smoking 2 months ago. Weight at home 136 pounds. No bleeding with coumadin.   01/17/12 ECHO 55%-60%  11/20/12 ECHO EF 40% Peak PA pressure 56 11/30/13 ECHO 40-45%  12/2015: ECHO 40%.   Labs        02/17/13: K+ 5.0, BUN 45, Creatinine 2.2, proBNP 10135       05/21/13 K 5.4 Creatinine 2.25 Pro BNP 3076        06/09/13 K 4.6 Creatinine 2.1 Pro BNP 10,727        07/07/13 Creatinine  2.06 K 5.0         08/19/13 Creatinine 2.5 Hemoglobin 5.9 Pro BNP 8266         08/27/13 K 4.5 Creatinine 2.78 Hemoglobin 7.2        09/21/13 K 4.2, creatinine 2.56         01/14/14 K 4.7, creatinine 2.29        03/08/14 K 4.8 Creatinine 2.17         12/27/2015: K 3.9 Creatinine 4.05  SH: Lives with his cousin in [place]. Smokes 8 cigarettes per day. Occasional ETOH and no drugs  FH: Mother deceased: liver failure, no CAD        Father deceased: CAD, MI  ROS: All systems negative except as listed in HPI, PMH and Problem List.  Past Medical History:  Diagnosis Date  . Blood dyscrasia   . Blood transfusion    "I've had a bunch of them"  . CHF (congestive heart failure) ([place])    followed by hf clinic  . Chronic kidney disease (CKD), stage III (moderate)   . Gout   . [id number])    "probably weekly" (01/13/2014)  . Legally blind   . Shortness of breath dyspnea   . Sickle cell disease, type SS ([place])   . Swelling abdomen   . Swelling of extremity, left   . Swelling of  extremity, right     Current Outpatient Prescriptions  Medication Sig Dispense Refill  . allopurinol (ZYLOPRIM) 100 MG tablet TAKE 1 TABLET BY MOUTH EVERY DAY 30 tablet 2  . aspirin EC 81 MG tablet Take 81 mg by mouth daily.    . calcitRIOL (ROCALTROL) 0.25 MCG capsule Take 0.25 mcg by mouth daily.     . carvedilol (COREG) 12.5 MG tablet Take 1 tablet (12.5 mg total) by mouth 2 (two) times daily with a meal. 60 tablet 0  . folic acid (FOLVITE) 1 MG tablet TAKE 1 TABLET (1 MG TOTAL) BY MOUTH DAILY. 30 tablet 10  . hydrALAZINE (APRESOLINE) 50 MG tablet Take 50 mg by mouth 2 (two) times daily.    . hydroxyurea (HYDREA) 500 MG capsule TAKE ONE CAPSULE BY MOUTH DAILY . MAY TAKE WITH FOOD TO MINIMIZE GI SIDE EFFECTS 30 capsule 3  . isosorbide mononitrate (IMDUR) 60 MG 24 hr tablet TAKE 1 TABLET BY MOUTH EVERY DAY 30 tablet 3  . torsemide (DEMADEX) 100 MG tablet Take 1 tablet (100 mg total) by mouth daily. 30 tablet 0  .  warfarin (COUMADIN) 5 MG tablet Take 1 tablet (5 mg total) by mouth daily at 6 PM. 30 tablet 1   No current facility-administered medications for this encounter.     Vitals:   01/20/16 0951  BP: 122/70  Pulse: 77  SpO2: 97%  Weight: 141 lb 6.4 oz (64.1 kg)    PHYSICAL EXAM: General:  Chronically ill appearing.  No resp difficulty  HEENT: normal except for mild scleral icterus Neck: supple. JVP 8-9 with +HJR, Carotids 2+ bilaterally; no bruits. No lymphadenopathy or thryomegaly appreciated. Cor: PMI normal Regular rate & rhythm. No rubs, gallops 2/6 TR and 2/6 MR Lungs: clear    Abdomen: soft, nontender, non-distended. No hepatosplenomegaly. No bruits or masses. Good bowel sounds. Extremities: no cyanosis, rash,  R and LLE trace edema ,  + clubbing., RUE +thrill and bruit Neuro: alert & orientedx3, cranial nerves grossly intact. Moves all 4 extremities w/o difficulty. Affect pleasant.  EKG: Sinus Rhythm 1st degree heart block 72 bpm   ASSESSMENT & PLAN:  1. Chronic Systolic Heart Failure:  NICM. Cath 05/2011 without significant coronary disease.  ECHO 2017 40% - NYHA II symptoms. Volume status mildly elevated. Continue torsemide 100 mg daily.   -  Continue coreg 12.5 twice a day.   - Continue hydralazine to 50 mg twice a day an Imdur 60 mg daily. - He is not on Ace/Arb/spiro due to hyperkalemia/CKD - Reinforced the need and importance of daily weights, a low sodium diet, and fluid restriction (less than 2 L a day). Instructed to call the HF clinic if weight increases more than 3 lbs overnight or 5 lbs in a week.  2. HTN: stable, continue current medications.  3  Sickle Cell: Follows closely in sickle cell clinic.  4. Current smoker:  Stopped 2 months ago.   5. CKD stabe IV: Continue to follow renal function closely. Followed by nephrology, Dr Lorrene Reid. Has RUE fistula. He has follow up with nephrology.   6. PAF- back in NSR today. On coumadin. INR followed at the Coumadin Clinic.    Check BMET  F/U 4 weeks  Amy [name] NP-C  01/20/2016   Patient seen and examined with [name], NP. We discussed all aspects of the encounter. I agree with the assessment and plan as stated above.   Overall looks pretty good. Volume status just mildly elevated. Back in  NSR. Tolerating coumadin. Reinforced need for daily weights and reviewed use of sliding scale diuretics.  Jenalee Trevizo,MD 8:35 PM

## 2016-01-20 NOTE — Progress Notes (Signed)
Advanced Heart Failure Medication Review by a Pharmacist  Does the patient  feel that his/her medications are working for him/her?  yes  Has the patient been experiencing any side effects to the medications prescribed?  no  Does the patient measure his/her own blood pressure or blood glucose at home?  no   Does the patient have any problems obtaining medications due to transportation or finances?   no  Understanding of regimen: good Understanding of indications: good Potential of compliance: good Patient understands to avoid NSAIDs. Patient understands to avoid decongestants.  Issues to address at subsequent visits: None   Pharmacist comments:  [name] is a pleasant 60 yo M presenting with a friend and without a medication list. He reports good compliance with his regimen and states that he's been feeling well since his hydralazine dose was reduced (was having dizziness/lightheadedness on 75 mg TID). He did not have any other medication-related questions or concerns for me at this time.   Ruta Hinds. Velva Harman, PharmD, BCPS, CPP Clinical Pharmacist Pager: [phone] Phone: [phone] 01/20/2016 9:58 AM      Time with patient: 10 minutes Preparation and documentation time: 2 minutes Total time: 12 minutes

## 2016-01-20 NOTE — Patient Instructions (Signed)
Lab today  Your physician recommends that you schedule a follow-up appointment in: 4 weeks  

## 2016-01-23 ENCOUNTER — Other Ambulatory Visit: Payer: Self-pay | Admitting: Licensed Clinical Social Worker

## 2016-01-23 NOTE — Patient Outreach (Signed)
[facility]) Care Management  01/23/2016  Patrick [name] 09-11-1955 [id number]   Assessment- CSW completed outreach to patient on 01/23/16. Patient answered successfully. CSW reviewed financial resources, vision resources and transportation resources. Patient has not contacted Education officer, museum at [place] with the services of the blind. CSW encouraged him to do so in order to gain resources and assistance. Patient reports that he has successfully set up SCAT evaluation appointment for this Wednesday.  Plan-CSW will follow up in one week to ensure that patient has gained stable transportation services.  Eula Fried, BSW, MSW, [email or web address] Phone: [phone] Fax: [phone]

## 2016-01-24 ENCOUNTER — Other Ambulatory Visit: Payer: Self-pay | Admitting: Internal Medicine

## 2016-01-26 ENCOUNTER — Other Ambulatory Visit: Payer: Self-pay

## 2016-01-26 NOTE — Patient Outreach (Signed)
[place] [facility]) Care Management  01/26/2016  Patrick [name] 1955/12/07 [id number]  Assessment: 60 year old with recent admission for new onset atrial fibrillation, heart failure. RNCM called for transition of care. No answer. HIPPA compliant message left.  Plan: continue weekly transition of care calls.  Thea Silversmith, RN, MSN, [place] Coordinator Cell: [phone]

## 2016-01-30 ENCOUNTER — Encounter: Payer: Self-pay | Admitting: Licensed Clinical Social Worker

## 2016-01-30 ENCOUNTER — Other Ambulatory Visit: Payer: Self-pay

## 2016-01-30 ENCOUNTER — Other Ambulatory Visit: Payer: Self-pay | Admitting: Licensed Clinical Social Worker

## 2016-01-30 DIAGNOSIS — M109 Gout, unspecified: Secondary | ICD-10-CM | POA: Diagnosis not present

## 2016-01-30 DIAGNOSIS — I770 Arteriovenous fistula, acquired: Secondary | ICD-10-CM | POA: Diagnosis not present

## 2016-01-30 DIAGNOSIS — Z79899 Other long term (current) drug therapy: Secondary | ICD-10-CM | POA: Diagnosis not present

## 2016-01-30 DIAGNOSIS — Z720 Tobacco use: Secondary | ICD-10-CM | POA: Diagnosis not present

## 2016-01-30 DIAGNOSIS — N2581 Secondary hyperparathyroidism of renal origin: Secondary | ICD-10-CM | POA: Diagnosis not present

## 2016-01-30 DIAGNOSIS — I4891 Unspecified atrial fibrillation: Secondary | ICD-10-CM | POA: Diagnosis not present

## 2016-01-30 DIAGNOSIS — D649 Anemia, unspecified: Secondary | ICD-10-CM | POA: Diagnosis not present

## 2016-01-30 DIAGNOSIS — I129 Hypertensive chronic kidney disease with stage 1 through stage 4 chronic kidney disease, or unspecified chronic kidney disease: Secondary | ICD-10-CM | POA: Diagnosis not present

## 2016-01-30 DIAGNOSIS — N184 Chronic kidney disease, stage 4 (severe): Secondary | ICD-10-CM | POA: Diagnosis not present

## 2016-01-30 DIAGNOSIS — D571 Sickle-cell disease without crisis: Secondary | ICD-10-CM | POA: Diagnosis not present

## 2016-01-30 DIAGNOSIS — I5020 Unspecified systolic (congestive) heart failure: Secondary | ICD-10-CM | POA: Diagnosis not present

## 2016-01-30 NOTE — Patient Outreach (Signed)
[place] [facility]) Care Management  01/30/2016  Patrick [name]   Nov 22, 1955 [id number]  Assessment- CSW completed outreach call to patient and he answered. Patient reports that he went to his SCAT assessment appointment last week. He states that "The lady said that I will get approved but they will have to send me a letter in the mail." CSW informed him that he should be getting an ID badge in the mail within the next week. Patient will now have stable transportation to medical appointments and non medical appointments. Goal met.   Patient continues to deny mental health resources. He reports "I don't need them." However, patient did want resources in regards to socialization stating "I just want to volunteer. I want to get out of the house and do some good." CSW provided education on various socialization opportunities in the community such as Day programs, [employer] and support groups. CSW also provided different contact numbers for Volunteer agencies that he is interested in. Patient appreciative of information. CSW informed him that if he wants to get resources and assistance from [employer] of the Legally Blind then he can contact [employer]. Number provided again. Patient appreciative of all resource information. Patient denies any further social work needs at this time. Patient agreeable to social work discharge and is aware that [place] will still be involved with his case.  [facility] CM Care Plan Problem One   Flowsheet Row Most Recent Value  Care Plan Problem One  Lack of stable transportation  Role Documenting the Problem One  Clinical Social Worker  Care Plan for Problem One  Active  THN Long Term Goal (31-90 days)  Patient will gain stable transportation to physician appointments within 90 days as evidenced by having to use cabs to go to appointments and difficulty affording this  [place] Term Goal Start Date  01/09/16  [facility] Long Term Goal Met Date  01/30/16   Interventions for Problem One Long Term Goal  Patient now has SCAT services.  THN CM Short Term Goal #1 (0-30 days)  Patient will be educated on available transportation resources within 30 days as evidenced by his desire to learn more about transportation resources  [facility] CM Short Term Goal #1 Start Date  01/09/16  [facility] CM Short Term Goal #1 Met Date  01/30/16  Interventions for Short Term Goal #1  Transportation resource education provided by CSW. SCAT assessment completed and he was approved for services.  THN CM Short Term Goal #2 (0-30 days)  Patient will review available Services of the Blind benefits within 30 days and contact them as needed as evidenced by being legally blind  THN CM Short Term Goal #2 Start Date  01/10/16  [facility] CM Short Term Goal #2 Met Date  01/30/16  Interventions for Short Term Goal #2  Patient reviewed resource information but has decided to not contact this program at this time.      Plan-CSW will inform RNCM and PCP of social work discharge. CSW provided contact number ot patient for him to use if he has any questions or concerns in the future.  Eula Fried, BSW, MSW, [email or web address] Phone: [phone] Fax: [phone]

## 2016-01-30 NOTE — Patient Outreach (Signed)
[facility]) Care Management  01/30/2016  Patrick [name] [birth year] [id number]   RNCM called for transition of care. No answer. HIPPA compliant message left.  Plan: await return call/follow up for transition of care next week.  Thea Silversmith, RN, MSN, [place] Coordinator Cell: [phone]

## 2016-02-01 ENCOUNTER — Ambulatory Visit (HOSPITAL_COMMUNITY)
Admission: RE | Admit: 2016-02-01 | Discharge: 2016-02-01 | Disposition: A | Discharge status: Home / Self Care | Payer: PPO | Source: Ambulatory Visit | Attending: Family Medicine | Admitting: Family Medicine

## 2016-02-01 DIAGNOSIS — D571 Sickle-cell disease without crisis: Secondary | ICD-10-CM | POA: Insufficient documentation

## 2016-02-01 LAB — PREPARE RBC (CROSSMATCH)

## 2016-02-01 MED ORDER — SODIUM CHLORIDE 0.9 % IV SOLN: Freq: Once | INTRAVENOUS | Status: DC

## 2016-02-01 NOTE — Discharge Instructions (Signed)
Transfusin de sangre, cuidados posteriores (Blood Transfusion, Care After) Estas indicaciones le proporcionan informacin acerca de cmo deber cuidarse despus del procedimiento. El mdico tambin podr darle instrucciones especficas. Comunquese con el mdico si tiene algn problema o tiene preguntas despus del procedimiento.  [employer] los medicamentos solamente como se lo haya indicado el mdico. Pregntele al mdico si puede tomar un analgsico de venta libre si tiene fiebre o dolor de [place] despus del procedimiento.  Retome sus actividades habituales como se lo haya indicado el mdico. SOLICITE AYUDA SI:   Presenta enrojecimiento o irritacin en el lugar donde se coloc la va intravenosa.  Tiene fiebre, escalofros o dolor de cabeza que no se [place].  La orina es ms oscura que lo normal.  La orina se vuelve de color:  Rosa.  Rojo.  Marrn.  La parte blanca de los ojos se vuelve amarilla (ictericia).  Se siente dbil despus de realizar sus actividades habituales. SOLICITE AYUDA DE INMEDIATO SI:   Tiene dificultad para respirar.  Tiene fiebre y escalofros, y tambin siente los siguientes sntomas:  Ansiedad.  Dolor de pecho o de espalda.  Piel rosada o irritada.  [place].  Latidos cardacos acelerados.  Ganas de vomitar (nuseas).   Esta informacin no tiene Marine scientist el consejo del mdico. Asegrese de hacerle al mdico cualquier pregunta que tenga.   Document Released: 06/25/2014 Elsevier Interactive Patient Education [employer].

## 2016-02-01 NOTE — Progress Notes (Addendum)
Provider: Dr. Lorrene Reid  Associated Diagnosis: Sickle cell Anemia  Procedure: Transfusion of 1 unit of PRBC transfused per order.  Tolerated well transfusion well. Went over discharge instructions and copy given to patient. Alert, oriented and ambulatory at time of discharge. Discharged to home.

## 2016-02-02 LAB — TYPE AND SCREEN
ABO/RH(D): AB POS
Antibody Screen: NEGATIVE
Unit division: 0

## 2016-02-03 ENCOUNTER — Ambulatory Visit (INDEPENDENT_AMBULATORY_CARE_PROVIDER_SITE_OTHER): Payer: PPO | Admitting: Pharmacist

## 2016-02-03 DIAGNOSIS — I4819 Other persistent atrial fibrillation: Secondary | ICD-10-CM

## 2016-02-03 DIAGNOSIS — I058 Other rheumatic mitral valve diseases: Secondary | ICD-10-CM

## 2016-02-03 DIAGNOSIS — I481 Persistent atrial fibrillation: Secondary | ICD-10-CM | POA: Diagnosis not present

## 2016-02-03 DIAGNOSIS — I059 Rheumatic mitral valve disease, unspecified: Secondary | ICD-10-CM

## 2016-02-03 DIAGNOSIS — Z5181 Encounter for therapeutic drug level monitoring: Secondary | ICD-10-CM | POA: Diagnosis not present

## 2016-02-03 LAB — POCT INR: INR: 2.2

## 2016-02-06 ENCOUNTER — Other Ambulatory Visit: Payer: Self-pay

## 2016-02-06 NOTE — Patient Outreach (Signed)
[facility]) Care Management  02/06/2016  Senica D Verbeek 08-Jun-1956 [id number]  RNCM called to complete transition of care call. No answer. HIPPA compliant message left.  Plan: await return call; telephonic transition of care call next week if no return call.  Thea Silversmith, RN, MSN, [place] Coordinator Cell: [phone]

## 2016-02-09 ENCOUNTER — Inpatient Hospital Stay (HOSPITAL_COMMUNITY)
Admission: RE | Admit: 2016-02-09 | Discharge: 2016-02-09 | Disposition: A | Discharge status: Home / Self Care | Payer: PPO | Source: Ambulatory Visit | Attending: Nephrology | Admitting: Nephrology

## 2016-02-13 ENCOUNTER — Other Ambulatory Visit: Payer: Self-pay

## 2016-02-13 ENCOUNTER — Ambulatory Visit: Payer: PPO

## 2016-02-13 NOTE — Patient Outreach (Addendum)
[name]) Care Management  02/13/2016  Patrick [name] [birth year] [id number]   RNCM called for transition of care call. Membe reports he is doing well. Denies pain, denies shortness of breath, denies any edema.   Medications reviewed. Member reports he is taking medications per EPIC medication list. RNCM received office note from nephrologist. Member is agreeable to [place] consult for medication reconciliation between member and providers.   Member states he was informed that he recently had an EKG and was tole that his heart is back in normal rhythm. RNCM reinforced how atrial fibrillation can bring on an exacerbation of heart failure. Signs/symptoms of exacerbation reinforced.   Re: heart failure-member denies any increased weight gain, swelling, shortness or breath. Member states he continues to weigh daily stating his weight this morning was 141.5 pounds which is unchanged from yesterday.  Transition of care program complete. Member is agreeable to health coach to follow regarding heart failure education/reinforcement.  Plan: pharmacy referral and transition to health coach.  Thea Silversmith, RN, MSN, [place] Coordinator Cell: [phone]

## 2016-02-15 ENCOUNTER — Other Ambulatory Visit: Payer: Self-pay

## 2016-02-15 NOTE — Patient Outreach (Signed)
[facility]) Care Management  02/15/2016  BIAGGIO KOSTURA 11-20-1955 [id number]   Telephone call to patient for introductory call. Patient reports he is doing good.  Explained to patient health coach role.  Patient receptive to call.  Plan: RN Health Coach will contact patient in the month of September and patient agrees to next outreach.   [name], RN, MSN [place] [phone]

## 2016-02-19 ENCOUNTER — Other Ambulatory Visit: Payer: Self-pay | Admitting: Internal Medicine

## 2016-02-21 ENCOUNTER — Ambulatory Visit (HOSPITAL_COMMUNITY)
Admission: RE | Admit: 2016-02-21 | Discharge: 2016-02-21 | Disposition: A | Discharge status: Home / Self Care | Payer: PPO | Source: Ambulatory Visit | Attending: Cardiology | Admitting: Cardiology

## 2016-02-21 VITALS — BP 120/70 | HR 82 | Wt 148.0 lb

## 2016-02-21 DIAGNOSIS — N183 Chronic kidney disease, stage 3 (moderate): Secondary | ICD-10-CM | POA: Diagnosis not present

## 2016-02-21 DIAGNOSIS — I480 Paroxysmal atrial fibrillation: Secondary | ICD-10-CM | POA: Insufficient documentation

## 2016-02-21 DIAGNOSIS — I5033 Acute on chronic diastolic (congestive) heart failure: Secondary | ICD-10-CM | POA: Diagnosis not present

## 2016-02-21 DIAGNOSIS — D571 Sickle-cell disease without crisis: Secondary | ICD-10-CM | POA: Insufficient documentation

## 2016-02-21 DIAGNOSIS — M109 Gout, unspecified: Secondary | ICD-10-CM | POA: Insufficient documentation

## 2016-02-21 DIAGNOSIS — I130 Hypertensive heart and chronic kidney disease with heart failure and stage 1 through stage 4 chronic kidney disease, or unspecified chronic kidney disease: Secondary | ICD-10-CM | POA: Insufficient documentation

## 2016-02-21 DIAGNOSIS — Z8249 Family history of ischemic heart disease and other diseases of the circulatory system: Secondary | ICD-10-CM | POA: Diagnosis not present

## 2016-02-21 DIAGNOSIS — Z87891 Personal history of nicotine dependence: Secondary | ICD-10-CM | POA: Insufficient documentation

## 2016-02-21 DIAGNOSIS — I428 Other cardiomyopathies: Secondary | ICD-10-CM | POA: Insufficient documentation

## 2016-02-21 DIAGNOSIS — Z7982 Long term (current) use of aspirin: Secondary | ICD-10-CM | POA: Diagnosis not present

## 2016-02-21 DIAGNOSIS — I5042 Chronic combined systolic (congestive) and diastolic (congestive) heart failure: Secondary | ICD-10-CM | POA: Insufficient documentation

## 2016-02-21 DIAGNOSIS — Z79899 Other long term (current) drug therapy: Secondary | ICD-10-CM | POA: Diagnosis not present

## 2016-02-21 DIAGNOSIS — I10 Essential (primary) hypertension: Secondary | ICD-10-CM | POA: Diagnosis not present

## 2016-02-21 DIAGNOSIS — H548 Legal blindness, as defined in USA: Secondary | ICD-10-CM | POA: Insufficient documentation

## 2016-02-21 DIAGNOSIS — Z7901 Long term (current) use of anticoagulants: Secondary | ICD-10-CM | POA: Insufficient documentation

## 2016-02-21 DIAGNOSIS — N184 Chronic kidney disease, stage 4 (severe): Secondary | ICD-10-CM

## 2016-02-21 LAB — BASIC METABOLIC PANEL
Anion gap: 12 (ref 5–15)
BUN: 88 mg/dL — ABNORMAL HIGH (ref 6–20)
CO2: 22 mmol/L (ref 22–32)
Calcium: 8.6 mg/dL — ABNORMAL LOW (ref 8.9–10.3)
Chloride: 103 mmol/L (ref 101–111)
Creatinine, Ser: 3.84 mg/dL — ABNORMAL HIGH (ref 0.61–1.24)
GFR calc Af Amer: 18 mL/min — ABNORMAL LOW (ref >60)
GFR calc non Af Amer: 16 mL/min — ABNORMAL LOW (ref >60)
Glucose, Bld: 89 mg/dL (ref 65–99)
Potassium: 3.8 mmol/L (ref 3.5–5.1)
Sodium: 137 mmol/L (ref 135–145)

## 2016-02-21 MED ORDER — TORSEMIDE 100 MG PO TABS: 100.0000 mg | ORAL_TABLET | Freq: Every day | ORAL | 3 refills | Status: DC

## 2016-02-21 NOTE — Progress Notes (Signed)
Advanced Heart Failure Medication Review by a Pharmacist  Does the patient  feel that his/her medications are working for him/her?  yes  Has the patient been experiencing any side effects to the medications prescribed?  no  Does the patient measure his/her own blood pressure or blood glucose at home?  no   Does the patient have any problems obtaining medications due to transportation or finances?   no  Understanding of regimen: good Understanding of indications: good Potential of compliance: good Patient understands to avoid NSAIDs. Patient understands to avoid decongestants.  Issues to address at subsequent visits: None   Pharmacist comments:  Patrick [name] is a pleasant 60 yo M presenting without a medication list but with good recall of his regimen. He reports good compliance with his regimen and did not have any specific medication-related questions or concerns for me at this time.   Ruta Hinds. Velva Harman, PharmD, BCPS, CPP Clinical Pharmacist Pager: [phone] Phone: [phone] 02/21/2016 10:38 AM      Time with patient: 10 minutes Preparation and documentation time: 2 minutes Total time: 12 minutes

## 2016-02-21 NOTE — Patient Instructions (Signed)
Take an additional 100 mg of Torsemide for the next 1-2 days. May take additional tab thereafter as needed for shortness of breath  Be sure to follow up with your nephrologist   Your physician recommends that you schedule a follow-up appointment in: 4-6 weeks

## 2016-02-21 NOTE — Progress Notes (Signed)
Patient ID: Patrick [name], male   DOB: 11-05-1955, 60 y.o.   MRN: [id number]     Advanced Heart Failure Clinic Note   PCP: [name] : Dr Zigmund Daniel  Opthalmologist: [name] HF MD: Dr Marquette Saa     HPI: [name] is a 60 y.o. African American male with a history of sickle cell anemia, systolic HF due to NICM EF 40% 11/2012 initially diagnosed 05/2011, no CAD by cath 05/2011, CKD stage III, legally blind, and tobacco abuse.   Admitted 11/18/12 from HF clinic with voume overload. Hemoglobin 6.7 Transfused 2 UPRBC. Diuresed with IV lasix. Lisinopril stopped due to CKD. Discharge weight 143 pounds.    Admitted 02/07/13 through 02/12/13 after being treated for N/V/D, and abdominal pain. CT of abdomen- cholelithias with plans to f/u for elective cholecystectomy. Received 2UPRBC.  Hemoglobin 6.1>7.1.  Diuretics held due to dehydration. He restarted lasix 60 mg bid 02/13/13.   Admitted to [facility] 3/4 through 08/22/13 with anemia and volume overload. Hemglobin was 5.9. Received 1U PRBC with hemoglobin increased to 6.4 Diuresed with IV lasix. Discharged on carvedilol 25 mg twice a day, lasix 80 mg twice a day, hydralazine 25 mg tid, and imdur 30 mg daily.  D/C weight was 140 pounds.  He is not on Spironolactone due to hyperkalemia. He is not on ace inhibitor due to renal failure.  Admitted 7/6 through [id number] with A/C systolic heart failure and CKD in setting of new A fib. Started coumadin. Discharge weight was 135 pounds.   He presents today for regular follow up. Last visit was thought to be mildly volume overloaded. Re-addressed sliding scale diuretics.  Weight up 7 lbs from last visit. Weight at home 143.6.  Hasn't taken any meds today, or any extra torsemide over all. Feeling more bloated. Going to gym most days.  Gets SOB on walking up an incline. None on flat ground or with ADLs. Drinking > 2L a day.  Cooks at home. Uses salt occasionally. Stopped smoking in 11/2014.  No bleeding on  coumadin.   01/17/12 ECHO 55%-60%  11/20/12 ECHO EF 40% Peak PA pressure 56 11/30/13 ECHO 40-45%  12/2015: ECHO 40%.   Labs        02/17/13: K+ 5.0, BUN 45, Creatinine 2.2, proBNP 10135       05/21/13 K 5.4 Creatinine 2.25 Pro BNP 3076        06/09/13 K 4.6 Creatinine 2.1 Pro BNP 10,727        07/07/13 Creatinine 2.06 K 5.0         08/19/13 Creatinine 2.5 Hemoglobin 5.9 Pro BNP 8266         08/27/13 K 4.5 Creatinine 2.78 Hemoglobin 7.2        09/21/13 K 4.2, creatinine 2.56         01/14/14 K 4.7, creatinine 2.29        03/08/14 K 4.8 Creatinine 2.17         12/27/2015: K 3.9 Creatinine 4.05  SH: Lives with his cousin in [place]. Smokes 8 cigarettes per day. Occasional ETOH and no drugs  FH: Mother deceased: liver failure, no CAD        Father deceased: CAD, MI  ROS: All systems negative except as listed in HPI, PMH and Problem List.  Past Medical History:  Diagnosis Date  . Blood dyscrasia   . Blood transfusion    "I've had a bunch of them"  . CHF (congestive heart failure) ([place])  followed by hf clinic  . Chronic kidney disease (CKD), stage III (moderate)   . Gout   . [id number])    "probably weekly" (01/13/2014)  . Legally blind   . Shortness of breath dyspnea   . Sickle cell disease, type SS ([place])   . Swelling abdomen   . Swelling of extremity, left   . Swelling of extremity, right     Current Outpatient Prescriptions  Medication Sig Dispense Refill  . allopurinol (ZYLOPRIM) 100 MG tablet TAKE 1 TABLET BY MOUTH EVERY DAY 30 tablet 2  . aspirin EC 81 MG tablet Take 81 mg by mouth daily.    . calcitRIOL (ROCALTROL) 0.25 MCG capsule Take 0.25 mcg by mouth daily.     . carvedilol (COREG) 12.5 MG tablet Take 1 tablet (12.5 mg total) by mouth 2 (two) times daily with a meal. 60 tablet 0  . folic acid (FOLVITE) 1 MG tablet TAKE 1 TABLET (1 MG TOTAL) BY MOUTH DAILY. 30 tablet 10  . hydrALAZINE (APRESOLINE) 50 MG tablet Take 50 mg by mouth 2 (two) times daily.    . hydroxyurea  (HYDREA) 500 MG capsule TAKE ONE CAPSULE BY MOUTH DAILY . MAY TAKE WITH FOOD TO MINIMIZE GI SIDE EFFECTS 30 capsule 3  . isosorbide mononitrate (IMDUR) 60 MG 24 hr tablet TAKE 1 TABLET BY MOUTH EVERY DAY 30 tablet 3  . torsemide (DEMADEX) 100 MG tablet TAKE 1 TABLET EVERY DAY 30 tablet 0  . warfarin (COUMADIN) 5 MG tablet Take 1 tablet (5 mg total) by mouth daily at 6 PM. 30 tablet 1   No current facility-administered medications for this encounter.     Vitals:   02/21/16 1024  BP: 120/70  BP Location: Left Arm  Patient Position: Sitting  Cuff Size: Normal  Pulse: 82  SpO2: 95%  Weight: 148 lb (67.1 kg)   Wt Readings from Last 3 Encounters:  02/21/16 148 lb (67.1 kg)  01/20/16 141 lb 6.4 oz (64.1 kg)  01/19/16 136 lb (61.7 kg)    PHYSICAL EXAM: General:  Chronically ill appearing.  No resp difficulty  HEENT: normal except for mild scleral icterus Neck: supple. JVP 9-10 with +HJR, Carotids 2+ bilaterally; no bruits. No thyromegaly or nodule noted.  Cor: PMI normal RRR. No rubs, gallops 2/6 TR and 2/6 MR Lungs: CTAB, normal effort Abdomen: soft, NT, ND, no HSM. No bruits or masses. +BS  Extremities: no cyanosis, rash,  R and LLE trace edema ,  + clubbing., RUE +thrill and bruit with fistula Neuro: alert & orientedx3, cranial nerves grossly intact. Moves all 4 extremities w/o difficulty. Affect pleasant.  ASSESSMENT & PLAN:  1. Chronic Combined Heart Failure:  NICM. Cath 05/2011 without significant coronary disease.  ECHO 2017 40% - NYHA II symptoms. Volume status elevated.  - Continue torsemide 100 mg daily. Will have him take extra 100 mg for the next 1-2 days depending on what his weight does. Needs to follow up with renal.   -Continue coreg 12.5 BID. - Continue hydralazine to 50 mg twice a day an Imdur 60 mg daily. - He is not on Ace/Arb/spiro due to hyperkalemia/CKD - Reinforced the need and importance of daily weights, a low sodium diet, and fluid restriction (less than 2  L a day). Instructed to call the HF clinic if weight increases more than 3 lbs overnight or 5 lbs in a week.  2. HTN: - Stable on current medications.  3  Sickle Cell:  - Follows closely in  sickle cell clinic.  4. Former smoker:   - Stopped 11/2015 5. CKD stable IV:  - BMET today. Followed by nephrology, Dr Lorrene Reid. Has RUE fistula.  - Suspect he may be nearing dialysis.    6. PAF-  - Remains in NSR today by exam.  - On coumadin. INR followed at [facility].   Check BMET. Take extra torsemide x 2 days.  Needs renal follow up. Follow up 4-6 weeks.    Shirley Friar, PA-C 02/21/2016   Total time spent > 25 minutes. Over half that spent discussing the above.

## 2016-02-22 DIAGNOSIS — D571 Sickle-cell disease without crisis: Secondary | ICD-10-CM | POA: Diagnosis not present

## 2016-02-22 DIAGNOSIS — H353222 Exudative age-related macular degeneration, left eye, with inactive choroidal neovascularization: Secondary | ICD-10-CM | POA: Diagnosis not present

## 2016-02-22 DIAGNOSIS — H353131 Nonexudative age-related macular degeneration, bilateral, early dry stage: Secondary | ICD-10-CM | POA: Diagnosis not present

## 2016-02-22 DIAGNOSIS — H353212 Exudative age-related macular degeneration, right eye, with inactive choroidal neovascularization: Secondary | ICD-10-CM | POA: Diagnosis not present

## 2016-02-24 ENCOUNTER — Ambulatory Visit (INDEPENDENT_AMBULATORY_CARE_PROVIDER_SITE_OTHER): Payer: PPO | Admitting: *Deleted

## 2016-02-24 DIAGNOSIS — Z5181 Encounter for therapeutic drug level monitoring: Secondary | ICD-10-CM

## 2016-02-24 DIAGNOSIS — I480 Paroxysmal atrial fibrillation: Secondary | ICD-10-CM

## 2016-02-24 DIAGNOSIS — I4819 Other persistent atrial fibrillation: Secondary | ICD-10-CM

## 2016-02-24 DIAGNOSIS — I481 Persistent atrial fibrillation: Secondary | ICD-10-CM | POA: Diagnosis not present

## 2016-02-24 DIAGNOSIS — I059 Rheumatic mitral valve disease, unspecified: Secondary | ICD-10-CM

## 2016-02-24 DIAGNOSIS — I058 Other rheumatic mitral valve diseases: Secondary | ICD-10-CM

## 2016-02-24 LAB — POCT INR: INR: 2.2

## 2016-03-10 ENCOUNTER — Other Ambulatory Visit: Payer: Self-pay | Admitting: Internal Medicine

## 2016-03-12 DIAGNOSIS — I770 Arteriovenous fistula, acquired: Secondary | ICD-10-CM | POA: Diagnosis not present

## 2016-03-12 DIAGNOSIS — Z23 Encounter for immunization: Secondary | ICD-10-CM | POA: Diagnosis not present

## 2016-03-12 DIAGNOSIS — I129 Hypertensive chronic kidney disease with stage 1 through stage 4 chronic kidney disease, or unspecified chronic kidney disease: Secondary | ICD-10-CM | POA: Diagnosis not present

## 2016-03-12 DIAGNOSIS — I5020 Unspecified systolic (congestive) heart failure: Secondary | ICD-10-CM | POA: Diagnosis not present

## 2016-03-12 DIAGNOSIS — D571 Sickle-cell disease without crisis: Secondary | ICD-10-CM | POA: Diagnosis not present

## 2016-03-12 DIAGNOSIS — D631 Anemia in chronic kidney disease: Secondary | ICD-10-CM | POA: Diagnosis not present

## 2016-03-12 DIAGNOSIS — I4891 Unspecified atrial fibrillation: Secondary | ICD-10-CM | POA: Diagnosis not present

## 2016-03-12 DIAGNOSIS — Z720 Tobacco use: Secondary | ICD-10-CM | POA: Diagnosis not present

## 2016-03-12 DIAGNOSIS — M109 Gout, unspecified: Secondary | ICD-10-CM | POA: Diagnosis not present

## 2016-03-12 DIAGNOSIS — N184 Chronic kidney disease, stage 4 (severe): Secondary | ICD-10-CM | POA: Diagnosis not present

## 2016-03-12 DIAGNOSIS — N2581 Secondary hyperparathyroidism of renal origin: Secondary | ICD-10-CM | POA: Diagnosis not present

## 2016-03-14 ENCOUNTER — Other Ambulatory Visit: Payer: Self-pay

## 2016-03-14 ENCOUNTER — Other Ambulatory Visit: Payer: Self-pay | Admitting: *Deleted

## 2016-03-14 ENCOUNTER — Other Ambulatory Visit: Payer: Self-pay | Admitting: Internal Medicine

## 2016-03-14 MED ORDER — WARFARIN SODIUM 5 MG PO TABS: 5.0000 mg | ORAL_TABLET | Freq: Every day | ORAL | 1 refills | Status: DC

## 2016-03-14 NOTE — Telephone Encounter (Signed)
[facility] RN called & stated the pt has been out of Coumadin for 2 weeks & stated the pt requested a refill & hasn't received it.  I checked to ensure that we have not filled the pt's Coumadin to date & we haven't; advised that we don't have any Coumadin requests for the pt but will fill today. refill sent to appropriate Pharmacy.

## 2016-03-14 NOTE — Patient Outreach (Signed)
[facility]) Care Management  [place]  03/14/2016   Patrick [name] 1955/07/14 [id number]  Subjective: Telephone call to patient for initial assessment. Patient reports he is doing well. He reports that his weights is running right around 140 lbs.  Patient reports having visual problems due to macular degeneration but has a reader that he uses at home to magnify things. Patient has family who helps with grocery shopping due to vision. Medication review done with patient and he is not taking his coumadin as he did not have any refills and had not heard anything back from his pharmacy.  Discussed with patient the importance of continuing coumadin and notifying health coach or physician if he does not get refills.  Advised patient that I would check on his coumadin to ensure that he got a new order.  He verbalized understanding.  Discussed with patient heart failure plan and when to notify physician. He verbalized understanding.    Objective:   Encounter Medications:  Outpatient Encounter Prescriptions as of 03/14/2016  Medication Sig  . allopurinol (ZYLOPRIM) 100 MG tablet TAKE 1 TABLET BY MOUTH EVERY DAY  . aspirin EC 81 MG tablet Take 81 mg by mouth daily.  . calcitRIOL (ROCALTROL) 0.25 MCG capsule Take 0.25 mcg by mouth daily.   . carvedilol (COREG) 12.5 MG tablet TAKE 1 TABLET BY MOUTH TWICE A DAY WITH MEALS  . folic acid (FOLVITE) 1 MG tablet TAKE 1 TABLET (1 MG TOTAL) BY MOUTH DAILY.  . hydrALAZINE (APRESOLINE) 50 MG tablet Take 50 mg by mouth 2 (two) times daily.  . hydroxyurea (HYDREA) 500 MG capsule TAKE ONE CAPSULE BY MOUTH DAILY . MAY TAKE WITH FOOD TO MINIMIZE GI SIDE EFFECTS  . isosorbide mononitrate (IMDUR) 60 MG 24 hr tablet TAKE 1 TABLET BY MOUTH EVERY DAY  . torsemide (DEMADEX) 100 MG tablet Take 1 tablet (100 mg total) by mouth daily. May take additional tablet as needed for SOB  . [DISCONTINUED] warfarin (COUMADIN) 5 MG tablet Take 1 tablet (5 mg  total) by mouth daily at 6 PM. (Patient not taking: Reported on 03/14/2016)   No facility-administered encounter medications on file as of 03/14/2016.     Functional Status:  In your present state of health, do you have any difficulty performing the following activities: 01/11/2016 12/22/2015  Hearing? N N  Vision? Y Y  Difficulty concentrating or making decisions? N N  Walking or climbing stairs? N N  Dressing or bathing? N N  Doing errands, shopping? N Y  Conservation officer, nature and eating ? N -  Using the Toilet? N -  In the past six months, have you accidently leaked urine? N -  Do you have problems with loss of bowel control? N -  Managing your Medications? N -  Managing your Finances? N -  Housekeeping or managing your Housekeeping? N -  Some recent data might be hidden    Fall/Depression Screening: PHQ 2/9 Scores 03/14/2016 01/10/2016 01/06/2016 10/25/2015 09/20/2015 08/11/2015 03/03/2015  PHQ - 2 Score 0 2 2 0 0 0 0  PHQ- 9 Score - 2 3 - - - -    Assessment: Patient will benefit from health coach outreach for disease management and support.    Plan:  [facility] CM Care Plan Problem One   Flowsheet Row Most Recent Value  Care Plan Problem One  knowledge deficit related to new diagnosis of atrial fibrillation  Role Documenting the Problem One  [place] for  Problem One  Active  THN CM Short Term Goal #1 (0-30 days)  Member will verbalize medications used in atrial fibrillation in the next 30 days.  THN CM Short Term Goal #1 Start Date  03/14/16  THN CM Short Term Goal #1 Met Date  -- [goal continued]  Interventions for Short Term Goal #1  RN Health Coach discussed with patient importance of taking medications and getting refills.    THN CM Short Term Goal #2 (0-30 days)  Member will be able to state role atrial fibrillation may play in heart failure within the next 30 days.  THN CM Short Term Goal #2 Start Date  03/14/16 Barrie Folk continued]  Interventions for Short Term Goal #2  [place] discussed coorelation between atrial fibrillation and heart failure.     [facility] CM Care Plan Problem Two   Flowsheet Row Most Recent Value  Care Plan Problem Two  Heart failure Knowledge deficit  Role Documenting the Problem Two  [place] for Problem Two  Active  Interventions for Problem Two Long Term Goal   RN Health Coach reviewed heart action plan and when to notify physician.    THN Long Term Goal (31-90) days  Patient will be able to verbalize heart failure zones within 90 days.   THN Long Term Goal Start Date  03/14/16      RN Health Coach will provide ongoing education for patient on heart failure through phone calls.  RN Health Coach sent welcome letter.   RN Health Coach will send initial barriers letter, assessment, and care plan to primary care physician.  RN Health Coach will contact patient in the month of October and patient agrees to next contact.  RN Health Coach will call pharmacy and coumadin clinic concerning coumadin.   [name], RN, MSN [place] [phone]

## 2016-03-14 NOTE — Patient Outreach (Signed)
[facility] [facility]) Care Management  03/14/2016  ADONIS YIM 13-Aug-1955 [id number]   Telephone call to [facility] on [address] in reference to patient coumadin.  Was told that they had not heard back from physician. Advised I would call physician.   Telephone call to coumadin clinic. Spoke with Candace.  Explained to her patient situation and that he has not had coumadin in about 2 weeks and needs a refill sent.  She states that they would get the order to his pharmacy and to reiterate with patient importance of notifying someone when he does not have his medication.   Telephone call back to patient to advise him that his prescription will be being sent to his pharmacy and to get medication as soon as possible and restart. He verbalized understanding.  Reiterated with patient importance of taking medication and notifying someone when he does not have his medications. He verbalized understanding.   [name], RN, MSN [place] [phone]

## 2016-03-19 ENCOUNTER — Other Ambulatory Visit: Payer: Self-pay | Admitting: Internal Medicine

## 2016-03-22 ENCOUNTER — Encounter (HOSPITAL_COMMUNITY)
Admission: RE | Admit: 2016-03-22 | Discharge: 2016-03-22 | Disposition: A | Discharge status: Home / Self Care | Payer: PPO | Source: Ambulatory Visit | Attending: Nephrology | Admitting: Nephrology

## 2016-03-22 DIAGNOSIS — N184 Chronic kidney disease, stage 4 (severe): Secondary | ICD-10-CM

## 2016-03-22 DIAGNOSIS — D638 Anemia in other chronic diseases classified elsewhere: Secondary | ICD-10-CM | POA: Diagnosis not present

## 2016-03-22 LAB — POCT HEMOGLOBIN-HEMACUE: Hemoglobin: 7.2 g/dL — ABNORMAL LOW (ref 13.0–17.0)

## 2016-03-22 MED ORDER — DARBEPOETIN ALFA 200 MCG/0.4ML IJ SOSY: 200.0000 ug | PREFILLED_SYRINGE | INTRAMUSCULAR | Status: DC

## 2016-03-22 MED ORDER — DARBEPOETIN ALFA 200 MCG/0.4ML IJ SOSY
PREFILLED_SYRINGE | INTRAMUSCULAR | Status: AC
  Administered 2016-03-22: 200 ug via SUBCUTANEOUS
  Filled 2016-03-22: qty 0.4

## 2016-03-22 NOTE — Progress Notes (Signed)
hgb 7.2 reported to [place] at [facility]. No new orders.

## 2016-03-23 ENCOUNTER — Ambulatory Visit (INDEPENDENT_AMBULATORY_CARE_PROVIDER_SITE_OTHER): Payer: PPO | Admitting: *Deleted

## 2016-03-23 ENCOUNTER — Encounter (INDEPENDENT_AMBULATORY_CARE_PROVIDER_SITE_OTHER): Payer: Self-pay

## 2016-03-23 DIAGNOSIS — Z5181 Encounter for therapeutic drug level monitoring: Secondary | ICD-10-CM | POA: Diagnosis not present

## 2016-03-23 DIAGNOSIS — I058 Other rheumatic mitral valve diseases: Secondary | ICD-10-CM

## 2016-03-23 DIAGNOSIS — I059 Rheumatic mitral valve disease, unspecified: Secondary | ICD-10-CM

## 2016-03-23 DIAGNOSIS — I4819 Other persistent atrial fibrillation: Secondary | ICD-10-CM

## 2016-03-23 DIAGNOSIS — I481 Persistent atrial fibrillation: Secondary | ICD-10-CM

## 2016-03-23 DIAGNOSIS — I480 Paroxysmal atrial fibrillation: Secondary | ICD-10-CM | POA: Diagnosis not present

## 2016-03-23 LAB — POCT INR: INR: 1.4

## 2016-03-29 ENCOUNTER — Encounter (INDEPENDENT_AMBULATORY_CARE_PROVIDER_SITE_OTHER): Payer: Self-pay

## 2016-03-29 ENCOUNTER — Ambulatory Visit (INDEPENDENT_AMBULATORY_CARE_PROVIDER_SITE_OTHER): Payer: PPO | Admitting: *Deleted

## 2016-03-29 DIAGNOSIS — Z5181 Encounter for therapeutic drug level monitoring: Secondary | ICD-10-CM

## 2016-03-29 DIAGNOSIS — I481 Persistent atrial fibrillation: Secondary | ICD-10-CM | POA: Diagnosis not present

## 2016-03-29 DIAGNOSIS — I059 Rheumatic mitral valve disease, unspecified: Secondary | ICD-10-CM | POA: Diagnosis not present

## 2016-03-29 DIAGNOSIS — I4819 Other persistent atrial fibrillation: Secondary | ICD-10-CM

## 2016-03-29 DIAGNOSIS — I480 Paroxysmal atrial fibrillation: Secondary | ICD-10-CM | POA: Diagnosis not present

## 2016-03-29 DIAGNOSIS — I058 Other rheumatic mitral valve diseases: Secondary | ICD-10-CM

## 2016-03-29 LAB — POCT INR: INR: 1.9

## 2016-04-03 ENCOUNTER — Encounter (HOSPITAL_COMMUNITY): Payer: Self-pay

## 2016-04-03 ENCOUNTER — Ambulatory Visit (HOSPITAL_COMMUNITY)
Admission: RE | Admit: 2016-04-03 | Discharge: 2016-04-03 | Disposition: A | Discharge status: Home / Self Care | Payer: PPO | Source: Ambulatory Visit | Attending: Cardiology | Admitting: Cardiology

## 2016-04-03 VITALS — BP 134/82 | HR 82 | Wt 147.5 lb

## 2016-04-03 DIAGNOSIS — F17210 Nicotine dependence, cigarettes, uncomplicated: Secondary | ICD-10-CM | POA: Diagnosis not present

## 2016-04-03 DIAGNOSIS — I509 Heart failure, unspecified: Secondary | ICD-10-CM | POA: Diagnosis not present

## 2016-04-03 DIAGNOSIS — N183 Chronic kidney disease, stage 3 (moderate): Secondary | ICD-10-CM | POA: Insufficient documentation

## 2016-04-03 DIAGNOSIS — Z79899 Other long term (current) drug therapy: Secondary | ICD-10-CM | POA: Insufficient documentation

## 2016-04-03 DIAGNOSIS — I5022 Chronic systolic (congestive) heart failure: Secondary | ICD-10-CM | POA: Insufficient documentation

## 2016-04-03 DIAGNOSIS — D571 Sickle-cell disease without crisis: Secondary | ICD-10-CM | POA: Insufficient documentation

## 2016-04-03 DIAGNOSIS — I480 Paroxysmal atrial fibrillation: Secondary | ICD-10-CM | POA: Insufficient documentation

## 2016-04-03 DIAGNOSIS — N184 Chronic kidney disease, stage 4 (severe): Secondary | ICD-10-CM | POA: Diagnosis not present

## 2016-04-03 DIAGNOSIS — I428 Other cardiomyopathies: Secondary | ICD-10-CM | POA: Insufficient documentation

## 2016-04-03 DIAGNOSIS — Z7901 Long term (current) use of anticoagulants: Secondary | ICD-10-CM | POA: Diagnosis not present

## 2016-04-03 DIAGNOSIS — Z7982 Long term (current) use of aspirin: Secondary | ICD-10-CM | POA: Insufficient documentation

## 2016-04-03 DIAGNOSIS — I130 Hypertensive heart and chronic kidney disease with heart failure and stage 1 through stage 4 chronic kidney disease, or unspecified chronic kidney disease: Secondary | ICD-10-CM | POA: Diagnosis not present

## 2016-04-03 DIAGNOSIS — H548 Legal blindness, as defined in USA: Secondary | ICD-10-CM | POA: Diagnosis not present

## 2016-04-03 NOTE — Addendum Note (Signed)
Encounter addended by: Scarlette Calico, RN on: 04/03/2016  9:52 AM<BR>    Actions taken: Sign clinical note

## 2016-04-03 NOTE — Progress Notes (Signed)
Patient ID: EL PILE, male   DOB: 1956-02-20, 60 y.o.   MRN: [id number]     Advanced Heart Failure Clinic Note   PCP: [name] : Dr Zigmund Yajayra Feldt  Opthalmologist: [name] HF MD: Dr Marquette Saa     HPI: Mr. Deems is a 60 y.o. African American male with a history of sickle cell anemia, systolic HF due to NICM EF 40% 11/2012 initially diagnosed 05/2011, no CAD by cath 05/2011, CKD stage III, legally blind, and tobacco abuse.   Admitted 11/18/12 from HF clinic with voume overload. Hemoglobin 6.7 Transfused 2 UPRBC. Diuresed with IV lasix. Lisinopril stopped due to CKD. Discharge weight 143 pounds.    Admitted to [facility] 3/4 through 08/22/13 with anemia and volume overload. Hemglobin was 5.9. Received 1U PRBC with hemoglobin increased to 6.4 Diuresed with IV lasix. Discharged on carvedilol 25 mg twice a day, lasix 80 mg twice a day, hydralazine 25 mg tid, and imdur 30 mg daily.  D/C weight was 140 pounds.  He is not on Spironolactone due to hyperkalemia. He is not on ace inhibitor due to renal failure.  Admitted 7/6 through 6/58/3212 with A/C systolic heart failure and CKD in setting of new A fib. Started coumadin. Discharge weight was 135 pounds.   He presents today for regular follow up. Last visit was thought to be mildly volume overloaded. Told to take extra torsemide. Now taking 100 mg daily. Weight at home 141. Taking meds more regularly.  Going to gym most days - rides bike and does TM for almost an hour a day.  Gets SOB on walking up an incline. None on flat ground or with ADLs. Stopped smoking in 11/2014.  No bleeding on coumadin.   01/17/12 ECHO 55%-60%  11/20/12 ECHO EF 40% Peak PA pressure 56 11/30/13 ECHO 40-45%  12/2015: ECHO 40%.   Labs        02/17/13: K+ 5.0, BUN 45, Creatinine 2.2, proBNP 10135       05/21/13 K 5.4 Creatinine 2.25 Pro BNP 3076        06/09/13 K 4.6 Creatinine 2.1 Pro BNP 10,727        07/07/13 Creatinine 2.06 K 5.0         08/19/13 Creatinine 2.5  Hemoglobin 5.9 Pro BNP 8266         08/27/13 K 4.5 Creatinine 2.78 Hemoglobin 7.2        09/21/13 K 4.2, creatinine 2.56         01/14/14 K 4.7, creatinine 2.29        03/08/14 K 4.8 Creatinine 2.17         12/27/2015: K 3.9 Creatinine 4.05  SH: Lives with his cousin in [place]. Smokes 8 cigarettes per day. Occasional ETOH and no drugs  FH: Mother deceased: liver failure, no CAD        Father deceased: CAD, MI  ROS: All systems negative except as listed in HPI, PMH and Problem List.  Past Medical History:  Diagnosis Date  . Blood dyscrasia   . Blood transfusion    "I've had a bunch of them"  . CHF (congestive heart failure) ([place])    followed by hf clinic  . Chronic kidney disease (CKD), stage III (moderate)   . Gout   . YQMGNOIB(704.8)    "probably weekly" (01/13/2014)  . Legally blind   . Shortness of breath dyspnea   . Sickle cell disease, type SS ([place])   . Swelling abdomen   .  Swelling of extremity, left   . Swelling of extremity, right     Current Outpatient Prescriptions  Medication Sig Dispense Refill  . allopurinol (ZYLOPRIM) 100 MG tablet TAKE 1 TABLET BY MOUTH EVERY DAY 30 tablet 2  . aspirin EC 81 MG tablet Take 81 mg by mouth daily.    . calcitRIOL (ROCALTROL) 0.25 MCG capsule Take 0.25 mcg by mouth daily.     . carvedilol (COREG) 12.5 MG tablet TAKE 1 TABLET BY MOUTH TWICE A DAY WITH MEALS 60 tablet 0  . folic acid (FOLVITE) 1 MG tablet TAKE 1 TABLET (1 MG TOTAL) BY MOUTH DAILY. 30 tablet 10  . hydrALAZINE (APRESOLINE) 50 MG tablet Take 50 mg by mouth 2 (two) times daily.    . hydroxyurea (HYDREA) 500 MG capsule TAKE ONE CAPSULE BY MOUTH DAILY . MAY TAKE WITH FOOD TO MINIMIZE GI SIDE EFFECTS 30 capsule 3  . isosorbide mononitrate (IMDUR) 60 MG 24 hr tablet TAKE 1 TABLET BY MOUTH EVERY DAY 30 tablet 3  . torsemide (DEMADEX) 100 MG tablet Take 1 tablet (100 mg total) by mouth daily. May take additional tablet as needed for SOB 45 tablet 3  . warfarin (COUMADIN) 5 MG  tablet Take 1 tablet (5 mg total) by mouth daily at 6 PM. Or as directed by Coumadin Clinic 35 tablet 1   No current facility-administered medications for this encounter.     Vitals:   04/03/16 0941  BP: 134/82  Pulse: 82  SpO2: 100%  Weight: 147 lb 8 oz (66.9 kg)   Wt Readings from Last 3 Encounters:  04/03/16 147 lb 8 oz (66.9 kg)  02/21/16 148 lb (67.1 kg)  01/20/16 141 lb 6.4 oz (64.1 kg)    PHYSICAL EXAM: General:  Well appearing.  No resp difficulty  HEENT: normal except for mild scleral icterus Neck: supple. JVP 5-6Carotids 2+ bilaterally; no bruits. No thyromegaly or nodule noted.  Cor: PMI normal RRR. No rubs, gallops 2/6 TR and 2/6 MR Lungs: CTAB, normal effort Abdomen: soft, NT, ND, no HSM. No bruits or masses. +BS  Extremities: no cyanosis, rash,  No edema ,  + clubbing., RUE +thrill and bruit with fistula Neuro: alert & orientedx3, cranial nerves grossly intact. Moves all 4 extremities w/o difficulty. Affect pleasant.  ASSESSMENT & PLAN:  1. Chronic Combined Heart Failure:  NICM. Cath 05/2011 without significant coronary disease.  ECHO 12/2015 40% - NYHA II symptoms. Volume status looks good.  - Continue torsemide 100 mg daily. Reinforced need for daily weights and reviewed use of sliding scale diuretics.  -Continue coreg 12.5 BID. - Continue hydralazine to 50 mg twice a day an Imdur 60 mg daily. - He is not on Ace/Arb/spiro due to hyperkalemia/CKD - Reinforced the need and importance of daily weights, a low sodium diet, and fluid restriction (less than 2 L a day). Instructed to call the HF clinic if weight increases more than 3 lbs overnight or 5 lbs in a week.  2. HTN: - Stable on current medications.  3  Sickle Cell:  - Follows closely in sickle cell clinic.  4. Former smoker:   - Stopped 11/2015 5. CKD stable IV:  - Followed by nephrology, Dr Lorrene Reid - has appt next week - will get blood at that time. Has RUE fistula.  6. PAF- CHADSVASC This patients  CHA2DS2-VASc Score and unadjusted Ischemic Stroke Rate (% per year) is equal to 3.2 % stroke rate/year from a score of 3 - Remains in NSR  today by exam.  - On coumadin. INR followed at the Coumadin Clinic.     Glori Bickers, MD 04/03/2016

## 2016-04-03 NOTE — Patient Instructions (Signed)
We will contact you in 4 months to schedule your next appointment.  

## 2016-04-12 ENCOUNTER — Ambulatory Visit (INDEPENDENT_AMBULATORY_CARE_PROVIDER_SITE_OTHER): Payer: PPO

## 2016-04-12 ENCOUNTER — Other Ambulatory Visit: Payer: Self-pay

## 2016-04-12 DIAGNOSIS — Z5181 Encounter for therapeutic drug level monitoring: Secondary | ICD-10-CM | POA: Diagnosis not present

## 2016-04-12 DIAGNOSIS — I4819 Other persistent atrial fibrillation: Secondary | ICD-10-CM

## 2016-04-12 DIAGNOSIS — I480 Paroxysmal atrial fibrillation: Secondary | ICD-10-CM | POA: Diagnosis not present

## 2016-04-12 DIAGNOSIS — I058 Other rheumatic mitral valve diseases: Secondary | ICD-10-CM

## 2016-04-12 DIAGNOSIS — I481 Persistent atrial fibrillation: Secondary | ICD-10-CM | POA: Diagnosis not present

## 2016-04-12 DIAGNOSIS — I059 Rheumatic mitral valve disease, unspecified: Secondary | ICD-10-CM | POA: Diagnosis not present

## 2016-04-12 LAB — POCT INR: INR: 1.8

## 2016-04-12 NOTE — Patient Outreach (Signed)
[place] [facility]) Care Management  [place]  04/12/2016   ALPHONSUS DOYEL Mar 07, 1956 [id number]  Subjective: Telephone call to patient for monthly call. Patient reports he is doing good.  He states he is maintaining.  Patient reports weights are stable and today's weight was 144 lbs. Discussed with patient heart failure and when to notify physician. Also discussed with patient atrial fibrillation and what to look for. He verbalized understanding.   Objective:   Encounter Medications:  Outpatient Encounter Prescriptions as of 04/12/2016  Medication Sig Note  . allopurinol (ZYLOPRIM) 100 MG tablet TAKE 1 TABLET BY MOUTH EVERY DAY   . aspirin EC 81 MG tablet Take 81 mg by mouth daily.   . calcitRIOL (ROCALTROL) 0.25 MCG capsule Take 0.25 mcg by mouth daily.    . carvedilol (COREG) 12.5 MG tablet TAKE 1 TABLET BY MOUTH TWICE A DAY WITH MEALS   . folic acid (FOLVITE) 1 MG tablet TAKE 1 TABLET (1 MG TOTAL) BY MOUTH DAILY.   . hydrALAZINE (APRESOLINE) 50 MG tablet Take 50 mg by mouth 2 (two) times daily.   . hydroxyurea (HYDREA) 500 MG capsule TAKE ONE CAPSULE BY MOUTH DAILY . MAY TAKE WITH FOOD TO MINIMIZE GI SIDE EFFECTS   . isosorbide mononitrate (IMDUR) 60 MG 24 hr tablet TAKE 1 TABLET BY MOUTH EVERY DAY   . torsemide (DEMADEX) 100 MG tablet Take 1 tablet (100 mg total) by mouth daily. May take additional tablet as needed for SOB   . warfarin (COUMADIN) 5 MG tablet Take 1 tablet (5 mg total) by mouth daily at 6 PM. Or as directed by Coumadin Clinic 03/14/2016: Patient states he did not have any refills.    No facility-administered encounter medications on file as of 04/12/2016.     Functional Status:  In your present state of health, do you have any difficulty performing the following activities: 03/22/2016 03/14/2016  Hearing? N N  Vision? N -  Difficulty concentrating or making decisions? N N  Walking or climbing stairs? N N  Dressing or bathing? N N  Doing  errands, shopping? - Y  Conservation officer, nature and eating ? - N  Using the Toilet? - N  In the past six months, have you accidently leaked urine? - N  Do you have problems with loss of bowel control? - N  Managing your Medications? - N  Managing your Finances? - N  Housekeeping or managing your Housekeeping? - N  Some recent data might be hidden    Fall/Depression Screening: PHQ 2/9 Scores 04/12/2016 03/14/2016 01/10/2016 01/06/2016 10/25/2015 09/20/2015 08/11/2015  PHQ - 2 Score 0 0 2 2 0 0 0  PHQ- 9 Score - - 2 3 - - -    Assessment: Patient continues to benefit from health coach outreach for disease management and support.    Plan:  [facility] CM Care Plan Problem One   Flowsheet Row Most Recent Value  Care Plan Problem One  knowledge deficit related to new diagnosis of atrial fibrillation  Role Documenting the Problem One  [place] for Problem One  Active  THN CM Short Term Goal #1 (0-30 days)  Member will verbalize medications used in atrial fibrillation in the next 30 days.  THN CM Short Term Goal #1 Start Date  04/12/16  THN CM Short Term Goal #1 Met Date  -- [goal continued]  Interventions for Short Term Goal #1  RN Health Coach reviewed with patient importance of taking medications  and getting refills.    THN CM Short Term Goal #2 (0-30 days)  Member will be able to state role atrial fibrillation may play in heart failure within the next 30 days.  THN CM Short Term Goal #2 Start Date  04/12/16 [goal continued]  Interventions for Short Term Goal #2  RN Health Coach reviewed coorelation between atrial fibrillation and heart failure.     [facility] CM Care Plan Problem Two   Flowsheet Row Most Recent Value  Care Plan Problem Two  Heart failure Knowledge deficit  Role Documenting the Problem Two  [place] for Problem Two  Active  Interventions for Problem Two Long Term Goal   RN Health Coach discussed heart action plan and when to notify physician.    THN Long Term Goal  (31-90) days  Patient will be able to verbalize heart failure zones within 90 days.   THN Long Term Goal Start Date  04/12/16     RN Health Coach will contact patient in the month of November and patient agrees to next outreach.  [name], RN, MSN [place] [phone]

## 2016-04-13 ENCOUNTER — Other Ambulatory Visit (HOSPITAL_COMMUNITY): Payer: Self-pay | Admitting: Internal Medicine

## 2016-04-13 ENCOUNTER — Other Ambulatory Visit: Payer: Self-pay | Admitting: Internal Medicine

## 2016-04-13 ENCOUNTER — Other Ambulatory Visit: Payer: Self-pay | Admitting: Family Medicine

## 2016-04-14 ENCOUNTER — Other Ambulatory Visit: Payer: Self-pay | Admitting: Family Medicine

## 2016-04-19 ENCOUNTER — Inpatient Hospital Stay (HOSPITAL_COMMUNITY): Admission: RE | Admit: 2016-04-19 | Payer: PPO | Source: Ambulatory Visit

## 2016-04-20 DIAGNOSIS — M109 Gout, unspecified: Secondary | ICD-10-CM | POA: Diagnosis not present

## 2016-04-20 DIAGNOSIS — N184 Chronic kidney disease, stage 4 (severe): Secondary | ICD-10-CM | POA: Diagnosis not present

## 2016-04-20 DIAGNOSIS — D631 Anemia in chronic kidney disease: Secondary | ICD-10-CM | POA: Diagnosis not present

## 2016-04-20 DIAGNOSIS — I4891 Unspecified atrial fibrillation: Secondary | ICD-10-CM | POA: Diagnosis not present

## 2016-04-20 DIAGNOSIS — D571 Sickle-cell disease without crisis: Secondary | ICD-10-CM | POA: Diagnosis not present

## 2016-04-20 DIAGNOSIS — I129 Hypertensive chronic kidney disease with stage 1 through stage 4 chronic kidney disease, or unspecified chronic kidney disease: Secondary | ICD-10-CM | POA: Diagnosis not present

## 2016-04-20 DIAGNOSIS — I5020 Unspecified systolic (congestive) heart failure: Secondary | ICD-10-CM | POA: Diagnosis not present

## 2016-04-20 DIAGNOSIS — I770 Arteriovenous fistula, acquired: Secondary | ICD-10-CM | POA: Diagnosis not present

## 2016-04-20 DIAGNOSIS — N2581 Secondary hyperparathyroidism of renal origin: Secondary | ICD-10-CM | POA: Diagnosis not present

## 2016-04-23 ENCOUNTER — Encounter (HOSPITAL_COMMUNITY)
Admission: RE | Admit: 2016-04-23 | Discharge: 2016-04-23 | Disposition: A | Discharge status: Home / Self Care | Payer: PPO | Source: Ambulatory Visit | Attending: Nephrology | Admitting: Nephrology

## 2016-04-23 DIAGNOSIS — N184 Chronic kidney disease, stage 4 (severe): Secondary | ICD-10-CM | POA: Diagnosis not present

## 2016-04-23 DIAGNOSIS — D638 Anemia in other chronic diseases classified elsewhere: Secondary | ICD-10-CM | POA: Insufficient documentation

## 2016-04-23 LAB — IRON AND TIBC
Iron: 65 ug/dL (ref 45–182)
Saturation Ratios: 22 % (ref 17.9–39.5)
TIBC: 294 ug/dL (ref 250–450)
UIBC: 229 ug/dL

## 2016-04-23 LAB — FERRITIN: Ferritin: 59 ng/mL (ref 24–336)

## 2016-04-23 MED ORDER — DARBEPOETIN ALFA 200 MCG/0.4ML IJ SOSY
PREFILLED_SYRINGE | INTRAMUSCULAR | Status: AC
  Filled 2016-04-23: qty 0.4

## 2016-04-23 MED ORDER — DARBEPOETIN ALFA 200 MCG/0.4ML IJ SOSY
200.0000 ug | PREFILLED_SYRINGE | INTRAMUSCULAR | Status: DC
  Administered 2016-04-23: 200 ug via SUBCUTANEOUS

## 2016-04-24 LAB — POCT HEMOGLOBIN-HEMACUE: Hemoglobin: 8 g/dL — ABNORMAL LOW (ref 13.0–17.0)

## 2016-04-26 ENCOUNTER — Ambulatory Visit (INDEPENDENT_AMBULATORY_CARE_PROVIDER_SITE_OTHER): Payer: PPO | Admitting: *Deleted

## 2016-04-26 DIAGNOSIS — I059 Rheumatic mitral valve disease, unspecified: Secondary | ICD-10-CM

## 2016-04-26 DIAGNOSIS — I058 Other rheumatic mitral valve diseases: Secondary | ICD-10-CM

## 2016-04-26 DIAGNOSIS — Z5181 Encounter for therapeutic drug level monitoring: Secondary | ICD-10-CM

## 2016-04-26 DIAGNOSIS — I481 Persistent atrial fibrillation: Secondary | ICD-10-CM | POA: Diagnosis not present

## 2016-04-26 DIAGNOSIS — I480 Paroxysmal atrial fibrillation: Secondary | ICD-10-CM | POA: Diagnosis not present

## 2016-04-26 DIAGNOSIS — I4819 Other persistent atrial fibrillation: Secondary | ICD-10-CM

## 2016-04-26 LAB — POCT INR: INR: 2.8

## 2016-05-07 ENCOUNTER — Other Ambulatory Visit: Payer: Self-pay

## 2016-05-07 NOTE — Patient Outreach (Signed)
[place] [facility]) Care Management  05/07/2016  Patrick [name] Feb 06, 1956 [id number]   Telephone call to patient for monthly call.  No answer. HIPAA compliant voice message left.    Plan: RN Health Coach will attempt patient again in the month of November.  [name], RN, MSN [place] [phone]

## 2016-05-17 ENCOUNTER — Other Ambulatory Visit: Payer: Self-pay | Admitting: Internal Medicine

## 2016-05-17 ENCOUNTER — Ambulatory Visit (INDEPENDENT_AMBULATORY_CARE_PROVIDER_SITE_OTHER): Payer: PPO | Admitting: *Deleted

## 2016-05-17 ENCOUNTER — Other Ambulatory Visit: Payer: Self-pay

## 2016-05-17 DIAGNOSIS — I480 Paroxysmal atrial fibrillation: Secondary | ICD-10-CM | POA: Diagnosis not present

## 2016-05-17 DIAGNOSIS — I481 Persistent atrial fibrillation: Secondary | ICD-10-CM

## 2016-05-17 DIAGNOSIS — Z5181 Encounter for therapeutic drug level monitoring: Secondary | ICD-10-CM | POA: Diagnosis not present

## 2016-05-17 DIAGNOSIS — I059 Rheumatic mitral valve disease, unspecified: Secondary | ICD-10-CM | POA: Diagnosis not present

## 2016-05-17 DIAGNOSIS — I058 Other rheumatic mitral valve diseases: Secondary | ICD-10-CM

## 2016-05-17 DIAGNOSIS — I4819 Other persistent atrial fibrillation: Secondary | ICD-10-CM

## 2016-05-17 LAB — POCT INR: INR: 2.1

## 2016-05-17 NOTE — Patient Outreach (Signed)
  [facility]) Care Management  05/17/2016  Rayland D Mccleese 10-02-1955 [id number]   Telephone call to patient for monthly call. No answer. HIPAA compliant voice message left.    Plan:  RN Health Coach will attempt patient in the month of December.  [name], RN, MSN [place] [phone]

## 2016-05-18 ENCOUNTER — Other Ambulatory Visit: Payer: Self-pay | Admitting: Family Medicine

## 2016-05-18 ENCOUNTER — Other Ambulatory Visit (HOSPITAL_COMMUNITY): Payer: Self-pay | Admitting: *Deleted

## 2016-05-21 ENCOUNTER — Encounter (HOSPITAL_COMMUNITY)
Admission: RE | Admit: 2016-05-21 | Discharge: 2016-05-21 | Disposition: A | Discharge status: Home / Self Care | Payer: PPO | Source: Ambulatory Visit | Attending: Nephrology | Admitting: Nephrology

## 2016-05-21 DIAGNOSIS — N184 Chronic kidney disease, stage 4 (severe): Secondary | ICD-10-CM | POA: Insufficient documentation

## 2016-05-21 DIAGNOSIS — D638 Anemia in other chronic diseases classified elsewhere: Secondary | ICD-10-CM | POA: Insufficient documentation

## 2016-05-21 LAB — POCT HEMOGLOBIN-HEMACUE: Hemoglobin: 7.8 g/dL — ABNORMAL LOW (ref 13.0–17.0)

## 2016-05-21 LAB — IRON AND TIBC
Iron: 74 ug/dL (ref 45–182)
Saturation Ratios: 24 % (ref 17.9–39.5)
TIBC: 314 ug/dL (ref 250–450)
UIBC: 240 ug/dL

## 2016-05-21 LAB — FERRITIN: Ferritin: 40 ng/mL (ref 24–336)

## 2016-05-21 MED ORDER — DARBEPOETIN ALFA 200 MCG/0.4ML IJ SOSY
200.0000 ug | PREFILLED_SYRINGE | INTRAMUSCULAR | Status: DC
  Administered 2016-05-21: 200 ug via SUBCUTANEOUS

## 2016-05-21 MED ORDER — DARBEPOETIN ALFA 200 MCG/0.4ML IJ SOSY
PREFILLED_SYRINGE | INTRAMUSCULAR | Status: AC
Start: 2016-05-21 — End: 2016-05-21
  Filled 2016-05-21: qty 0.4

## 2016-05-21 NOTE — Progress Notes (Signed)
Hgb 7.8. Pt denies any new symptoms. Reported to [facility]. No new orders per Dr. Lorrene Reid.

## 2016-05-23 DIAGNOSIS — I5020 Unspecified systolic (congestive) heart failure: Secondary | ICD-10-CM | POA: Diagnosis not present

## 2016-05-23 DIAGNOSIS — Z23 Encounter for immunization: Secondary | ICD-10-CM | POA: Diagnosis not present

## 2016-05-23 DIAGNOSIS — D571 Sickle-cell disease without crisis: Secondary | ICD-10-CM | POA: Diagnosis not present

## 2016-05-23 DIAGNOSIS — Z720 Tobacco use: Secondary | ICD-10-CM | POA: Diagnosis not present

## 2016-05-23 DIAGNOSIS — D631 Anemia in chronic kidney disease: Secondary | ICD-10-CM | POA: Diagnosis not present

## 2016-05-23 DIAGNOSIS — I770 Arteriovenous fistula, acquired: Secondary | ICD-10-CM | POA: Diagnosis not present

## 2016-05-23 DIAGNOSIS — I4891 Unspecified atrial fibrillation: Secondary | ICD-10-CM | POA: Diagnosis not present

## 2016-05-23 DIAGNOSIS — N2581 Secondary hyperparathyroidism of renal origin: Secondary | ICD-10-CM | POA: Diagnosis not present

## 2016-05-23 DIAGNOSIS — M109 Gout, unspecified: Secondary | ICD-10-CM | POA: Diagnosis not present

## 2016-05-23 DIAGNOSIS — N184 Chronic kidney disease, stage 4 (severe): Secondary | ICD-10-CM | POA: Diagnosis not present

## 2016-05-23 DIAGNOSIS — I129 Hypertensive chronic kidney disease with stage 1 through stage 4 chronic kidney disease, or unspecified chronic kidney disease: Secondary | ICD-10-CM | POA: Diagnosis not present

## 2016-06-05 ENCOUNTER — Other Ambulatory Visit: Payer: Self-pay

## 2016-06-05 NOTE — Patient Outreach (Addendum)
[place] [facility]) Care Management  06/05/2016  Patrick [name] 1955/08/24 [id number]   3rd Telephone call to patient for monthly outreach. Called home number [phone]. No answer.  HIPAA compliant voice message left.  Called [phone].  Male answered stating wrong number.    Plan: RN Health Coach will send letter to attempt outreach. If no response will proceed with case closure.   [name], RN, MSN [place] [phone]

## 2016-06-12 ENCOUNTER — Other Ambulatory Visit: Payer: Self-pay

## 2016-06-12 MED ORDER — WARFARIN SODIUM 5 MG PO TABS: 5.0000 mg | ORAL_TABLET | Freq: Every day | ORAL | 2 refills | Status: DC

## 2016-06-13 ENCOUNTER — Other Ambulatory Visit (HOSPITAL_COMMUNITY): Payer: Self-pay | Admitting: *Deleted

## 2016-06-13 MED ORDER — ISOSORBIDE MONONITRATE ER 60 MG PO TB24: 60.0000 mg | ORAL_TABLET | Freq: Every day | ORAL | 3 refills | Status: DC

## 2016-06-14 ENCOUNTER — Other Ambulatory Visit (HOSPITAL_COMMUNITY): Payer: Self-pay | Admitting: *Deleted

## 2016-06-14 ENCOUNTER — Other Ambulatory Visit: Payer: Self-pay | Admitting: *Deleted

## 2016-06-14 MED ORDER — ALLOPURINOL 100 MG PO TABS: 100.0000 mg | ORAL_TABLET | Freq: Every day | ORAL | 0 refills | Status: DC

## 2016-06-14 MED ORDER — ISOSORBIDE MONONITRATE ER 60 MG PO TB24: 60.0000 mg | ORAL_TABLET | Freq: Every day | ORAL | 3 refills | Status: DC

## 2016-06-19 ENCOUNTER — Encounter (HOSPITAL_COMMUNITY)
Admission: RE | Admit: 2016-06-19 | Discharge: 2016-06-19 | Disposition: A | Discharge status: Home / Self Care | Payer: PPO | Source: Ambulatory Visit | Attending: Nephrology | Admitting: Nephrology

## 2016-06-19 ENCOUNTER — Ambulatory Visit (INDEPENDENT_AMBULATORY_CARE_PROVIDER_SITE_OTHER): Payer: PPO

## 2016-06-19 DIAGNOSIS — I481 Persistent atrial fibrillation: Secondary | ICD-10-CM

## 2016-06-19 DIAGNOSIS — N184 Chronic kidney disease, stage 4 (severe): Secondary | ICD-10-CM | POA: Diagnosis not present

## 2016-06-19 DIAGNOSIS — Z5181 Encounter for therapeutic drug level monitoring: Secondary | ICD-10-CM

## 2016-06-19 DIAGNOSIS — I059 Rheumatic mitral valve disease, unspecified: Secondary | ICD-10-CM | POA: Diagnosis not present

## 2016-06-19 DIAGNOSIS — D638 Anemia in other chronic diseases classified elsewhere: Secondary | ICD-10-CM | POA: Diagnosis not present

## 2016-06-19 DIAGNOSIS — I4891 Unspecified atrial fibrillation: Secondary | ICD-10-CM

## 2016-06-19 DIAGNOSIS — I058 Other rheumatic mitral valve diseases: Secondary | ICD-10-CM

## 2016-06-19 DIAGNOSIS — I4819 Other persistent atrial fibrillation: Secondary | ICD-10-CM

## 2016-06-19 LAB — POCT INR: INR: 2

## 2016-06-19 LAB — IRON AND TIBC
Iron: 81 ug/dL (ref 45–182)
Saturation Ratios: 22 % (ref 17.9–39.5)
TIBC: 368 ug/dL (ref 250–450)
UIBC: 287 ug/dL

## 2016-06-19 LAB — POCT HEMOGLOBIN-HEMACUE: Hemoglobin: 8 g/dL — ABNORMAL LOW (ref 13.0–17.0)

## 2016-06-19 LAB — FERRITIN: Ferritin: 35 ng/mL (ref 24–336)

## 2016-06-19 MED ORDER — DARBEPOETIN ALFA 200 MCG/0.4ML IJ SOSY
PREFILLED_SYRINGE | INTRAMUSCULAR | Status: AC
  Filled 2016-06-19: qty 0.4

## 2016-06-19 MED ORDER — DARBEPOETIN ALFA 200 MCG/0.4ML IJ SOSY
200.0000 ug | PREFILLED_SYRINGE | INTRAMUSCULAR | Status: DC
  Administered 2016-06-19: 200 ug via SUBCUTANEOUS

## 2016-06-21 ENCOUNTER — Other Ambulatory Visit: Payer: Self-pay

## 2016-06-21 NOTE — Patient Outreach (Signed)
[place] [facility]) Care Management  06/21/2016  Patrick [name] Feb 08, 1956 [id number]  Patient has not responded to calls or letter.  Will close case and send letter to patient and physician.  . Will notify Care management assistant of case status.  [name], RN, MSN [place] [phone]

## 2016-06-26 DIAGNOSIS — I5020 Unspecified systolic (congestive) heart failure: Secondary | ICD-10-CM | POA: Diagnosis not present

## 2016-06-26 DIAGNOSIS — I770 Arteriovenous fistula, acquired: Secondary | ICD-10-CM | POA: Diagnosis not present

## 2016-06-26 DIAGNOSIS — N184 Chronic kidney disease, stage 4 (severe): Secondary | ICD-10-CM | POA: Diagnosis not present

## 2016-06-26 DIAGNOSIS — D571 Sickle-cell disease without crisis: Secondary | ICD-10-CM | POA: Diagnosis not present

## 2016-06-26 DIAGNOSIS — D631 Anemia in chronic kidney disease: Secondary | ICD-10-CM | POA: Diagnosis not present

## 2016-06-26 DIAGNOSIS — M109 Gout, unspecified: Secondary | ICD-10-CM | POA: Diagnosis not present

## 2016-06-26 DIAGNOSIS — I4891 Unspecified atrial fibrillation: Secondary | ICD-10-CM | POA: Diagnosis not present

## 2016-06-26 DIAGNOSIS — I129 Hypertensive chronic kidney disease with stage 1 through stage 4 chronic kidney disease, or unspecified chronic kidney disease: Secondary | ICD-10-CM | POA: Diagnosis not present

## 2016-06-26 DIAGNOSIS — N2581 Secondary hyperparathyroidism of renal origin: Secondary | ICD-10-CM | POA: Diagnosis not present

## 2016-06-26 DIAGNOSIS — Z720 Tobacco use: Secondary | ICD-10-CM | POA: Diagnosis not present

## 2016-07-09 ENCOUNTER — Other Ambulatory Visit (HOSPITAL_COMMUNITY): Payer: Self-pay | Admitting: Student

## 2016-07-09 DIAGNOSIS — I5033 Acute on chronic diastolic (congestive) heart failure: Secondary | ICD-10-CM

## 2016-07-11 ENCOUNTER — Other Ambulatory Visit: Payer: Self-pay | Admitting: Family Medicine

## 2016-07-11 ENCOUNTER — Other Ambulatory Visit: Payer: Self-pay | Admitting: *Deleted

## 2016-07-11 ENCOUNTER — Other Ambulatory Visit (HOSPITAL_COMMUNITY): Payer: Self-pay | Admitting: *Deleted

## 2016-07-11 MED ORDER — ISOSORBIDE MONONITRATE ER 60 MG PO TB24: 60.0000 mg | ORAL_TABLET | Freq: Every day | ORAL | 3 refills | Status: DC

## 2016-07-11 MED ORDER — WARFARIN SODIUM 5 MG PO TABS: 5.0000 mg | ORAL_TABLET | Freq: Every day | ORAL | 2 refills | Status: DC

## 2016-07-11 NOTE — Telephone Encounter (Signed)
Pharmacy requests a ninety day rx. 

## 2016-07-16 ENCOUNTER — Other Ambulatory Visit: Payer: Self-pay | Admitting: *Deleted

## 2016-07-17 ENCOUNTER — Encounter (HOSPITAL_COMMUNITY)
Admission: RE | Admit: 2016-07-17 | Discharge: 2016-07-17 | Disposition: A | Discharge status: Home / Self Care | Payer: PPO | Source: Ambulatory Visit | Attending: Nephrology | Admitting: Nephrology

## 2016-07-17 ENCOUNTER — Ambulatory Visit (INDEPENDENT_AMBULATORY_CARE_PROVIDER_SITE_OTHER): Payer: PPO | Admitting: *Deleted

## 2016-07-17 DIAGNOSIS — Z5181 Encounter for therapeutic drug level monitoring: Secondary | ICD-10-CM | POA: Diagnosis not present

## 2016-07-17 DIAGNOSIS — I4891 Unspecified atrial fibrillation: Secondary | ICD-10-CM

## 2016-07-17 DIAGNOSIS — I481 Persistent atrial fibrillation: Secondary | ICD-10-CM

## 2016-07-17 DIAGNOSIS — I058 Other rheumatic mitral valve diseases: Secondary | ICD-10-CM

## 2016-07-17 DIAGNOSIS — I059 Rheumatic mitral valve disease, unspecified: Secondary | ICD-10-CM | POA: Diagnosis not present

## 2016-07-17 DIAGNOSIS — N184 Chronic kidney disease, stage 4 (severe): Secondary | ICD-10-CM

## 2016-07-17 DIAGNOSIS — I4819 Other persistent atrial fibrillation: Secondary | ICD-10-CM

## 2016-07-17 LAB — POCT INR: INR: 2.2

## 2016-07-17 MED ORDER — DARBEPOETIN ALFA 200 MCG/0.4ML IJ SOSY
200.0000 ug | PREFILLED_SYRINGE | INTRAMUSCULAR | Status: DC
  Administered 2016-07-17: 200 ug via SUBCUTANEOUS

## 2016-07-17 MED ORDER — DARBEPOETIN ALFA 200 MCG/0.4ML IJ SOSY
PREFILLED_SYRINGE | INTRAMUSCULAR | Status: AC
  Administered 2016-07-17: 200 ug via SUBCUTANEOUS
  Filled 2016-07-17: qty 0.4

## 2016-07-17 MED ORDER — CARVEDILOL 12.5 MG PO TABS
12.5000 mg | ORAL_TABLET | Freq: Two times a day (BID) | ORAL | 0 refills | Status: DC
Start: 2016-07-17 — End: 2016-08-13

## 2016-07-18 LAB — POCT HEMOGLOBIN-HEMACUE: Hemoglobin: 9.4 g/dL — ABNORMAL LOW (ref 13.0–17.0)

## 2016-08-11 ENCOUNTER — Other Ambulatory Visit: Payer: Self-pay | Admitting: Family Medicine

## 2016-08-13 ENCOUNTER — Other Ambulatory Visit: Payer: Self-pay | Admitting: Internal Medicine

## 2016-08-13 ENCOUNTER — Other Ambulatory Visit (HOSPITAL_COMMUNITY): Payer: Self-pay | Admitting: *Deleted

## 2016-08-14 ENCOUNTER — Encounter (HOSPITAL_COMMUNITY)
Admission: RE | Admit: 2016-08-14 | Discharge: 2016-08-14 | Disposition: A | Discharge status: Home / Self Care | Payer: PPO | Source: Ambulatory Visit | Attending: Nephrology | Admitting: Nephrology

## 2016-08-14 DIAGNOSIS — D638 Anemia in other chronic diseases classified elsewhere: Secondary | ICD-10-CM | POA: Insufficient documentation

## 2016-08-14 DIAGNOSIS — N184 Chronic kidney disease, stage 4 (severe): Secondary | ICD-10-CM | POA: Insufficient documentation

## 2016-08-14 LAB — IRON AND TIBC
Iron: 74 ug/dL (ref 45–182)
Saturation Ratios: 21 % (ref 17.9–39.5)
TIBC: 350 ug/dL (ref 250–450)
UIBC: 276 ug/dL

## 2016-08-14 LAB — FERRITIN: Ferritin: 47 ng/mL (ref 24–336)

## 2016-08-14 MED ORDER — DARBEPOETIN ALFA 200 MCG/0.4ML IJ SOSY
PREFILLED_SYRINGE | INTRAMUSCULAR | Status: AC
  Filled 2016-08-14: qty 0.4

## 2016-08-14 MED ORDER — DARBEPOETIN ALFA 200 MCG/0.4ML IJ SOSY
200.0000 ug | PREFILLED_SYRINGE | INTRAMUSCULAR | Status: DC
  Administered 2016-08-14: 200 ug via SUBCUTANEOUS

## 2016-08-15 LAB — POCT HEMOGLOBIN-HEMACUE: Hemoglobin: 9.4 g/dL — ABNORMAL LOW (ref 13.0–17.0)

## 2016-08-16 ENCOUNTER — Other Ambulatory Visit: Payer: Self-pay | Admitting: Internal Medicine

## 2016-08-21 ENCOUNTER — Ambulatory Visit (INDEPENDENT_AMBULATORY_CARE_PROVIDER_SITE_OTHER): Payer: PPO | Admitting: *Deleted

## 2016-08-21 DIAGNOSIS — I4891 Unspecified atrial fibrillation: Secondary | ICD-10-CM | POA: Diagnosis not present

## 2016-08-21 DIAGNOSIS — I059 Rheumatic mitral valve disease, unspecified: Secondary | ICD-10-CM

## 2016-08-21 DIAGNOSIS — I4819 Other persistent atrial fibrillation: Secondary | ICD-10-CM

## 2016-08-21 DIAGNOSIS — Z5181 Encounter for therapeutic drug level monitoring: Secondary | ICD-10-CM | POA: Diagnosis not present

## 2016-08-21 DIAGNOSIS — I481 Persistent atrial fibrillation: Secondary | ICD-10-CM | POA: Diagnosis not present

## 2016-08-21 DIAGNOSIS — I058 Other rheumatic mitral valve diseases: Secondary | ICD-10-CM

## 2016-08-21 LAB — POCT INR: INR: 2.7

## 2016-08-29 DIAGNOSIS — D571 Sickle-cell disease without crisis: Secondary | ICD-10-CM | POA: Diagnosis not present

## 2016-08-29 DIAGNOSIS — I5020 Unspecified systolic (congestive) heart failure: Secondary | ICD-10-CM | POA: Diagnosis not present

## 2016-08-29 DIAGNOSIS — I129 Hypertensive chronic kidney disease with stage 1 through stage 4 chronic kidney disease, or unspecified chronic kidney disease: Secondary | ICD-10-CM | POA: Diagnosis not present

## 2016-08-29 DIAGNOSIS — M109 Gout, unspecified: Secondary | ICD-10-CM | POA: Diagnosis not present

## 2016-08-29 DIAGNOSIS — I770 Arteriovenous fistula, acquired: Secondary | ICD-10-CM | POA: Diagnosis not present

## 2016-08-29 DIAGNOSIS — Z720 Tobacco use: Secondary | ICD-10-CM | POA: Diagnosis not present

## 2016-08-29 DIAGNOSIS — I4891 Unspecified atrial fibrillation: Secondary | ICD-10-CM | POA: Diagnosis not present

## 2016-08-29 DIAGNOSIS — N2581 Secondary hyperparathyroidism of renal origin: Secondary | ICD-10-CM | POA: Diagnosis not present

## 2016-08-29 DIAGNOSIS — D631 Anemia in chronic kidney disease: Secondary | ICD-10-CM | POA: Diagnosis not present

## 2016-08-29 DIAGNOSIS — N184 Chronic kidney disease, stage 4 (severe): Secondary | ICD-10-CM | POA: Diagnosis not present

## 2016-09-10 ENCOUNTER — Other Ambulatory Visit: Payer: Self-pay

## 2016-09-10 NOTE — Patient Outreach (Signed)
[place] [facility]) Care Management  09/10/2016  Quantavis D Vanecek 09/23/1955 [id number]  REFERRAL DATE: 09/04/16 REFERRAL SOURCE: [facility] REFERRAL REASON: Assess for needs CONSENT: Patient verbally agreed to [facility] care management follow up  PROVIDERS:  Dr. Reuel Boom - primary MD Dr. Vaughan Browner  SOCIA:  Patient lives alone. Patient is legally blind due to macular degeneration. Patient has family members that assist him with his IADL'S Patient has [employer] and family members for transportation assistance.   SUBJECTIVE: Telephone call to patient regarding specialist referral. HIPAA verified with patient. Discussed and offered [facility] care management services. Patient verbally agreed to services.  Patient states he feels overall he is managing his care well. Patient reports he is following closely with Dr. Coralee Pesa heart failure clinic every 4 weeks and follows up with his kidney doctor at least every 2 months.  Patient reports he is legally blind. Patient he uses [employer] transportation and has the assistance of his cousins to help with grocery shopping and managing household.  Patient states he has congestive heart failure and atrial fibrillation. Patient states he is aware of heart failure signs/ symptoms. Patient states he is in the [place] zone today. Patient denies any swelling or shortness of breath. Patient states he is probably weighing every other day. Patient reports last weight on Saturday 09/08/16 was 154 lbs. Patient reports he is adhering to a low sodium diet approximately 50% of the time. Patient states he does have atrial fibrillation. Patient states he notes he feels funny if he is having problems with his atrial fib.  Patient states he is unsure how to monitor his pulse.   Assessment: Referred to [facility] care management by [facility] 61 year old male with history of  CKD 4, HTN, sickle cell, CHF, anemia secondary to hyperparathyroidism, gout, legally blind, macular  degeneration  PLAN; RNCM will refer patient to health coach for heart failure/ atrial fib follow up  Quinn Plowman RN,BSN,CCM [facility] Telephonic  [phone]

## 2016-09-11 ENCOUNTER — Encounter (HOSPITAL_COMMUNITY)
Admission: RE | Admit: 2016-09-11 | Discharge: 2016-09-11 | Disposition: A | Discharge status: Home / Self Care | Payer: PPO | Source: Ambulatory Visit | Attending: Nephrology | Admitting: Nephrology

## 2016-09-11 DIAGNOSIS — D638 Anemia in other chronic diseases classified elsewhere: Secondary | ICD-10-CM | POA: Diagnosis not present

## 2016-09-11 DIAGNOSIS — N184 Chronic kidney disease, stage 4 (severe): Secondary | ICD-10-CM | POA: Insufficient documentation

## 2016-09-11 LAB — IRON AND TIBC
Iron: 79 ug/dL (ref 45–182)
Saturation Ratios: 24 % (ref 17.9–39.5)
TIBC: 330 ug/dL (ref 250–450)
UIBC: 251 ug/dL

## 2016-09-11 LAB — POCT HEMOGLOBIN-HEMACUE: Hemoglobin: 8.3 g/dL — ABNORMAL LOW (ref 13.0–17.0)

## 2016-09-11 LAB — FERRITIN: Ferritin: 31 ng/mL (ref 24–336)

## 2016-09-11 MED ORDER — DARBEPOETIN ALFA 200 MCG/0.4ML IJ SOSY
PREFILLED_SYRINGE | INTRAMUSCULAR | Status: AC
  Filled 2016-09-11: qty 0.4

## 2016-09-11 MED ORDER — DARBEPOETIN ALFA 200 MCG/0.4ML IJ SOSY
200.0000 ug | PREFILLED_SYRINGE | INTRAMUSCULAR | Status: DC
  Administered 2016-09-11: 200 ug via SUBCUTANEOUS

## 2016-09-15 ENCOUNTER — Other Ambulatory Visit: Payer: Self-pay | Admitting: Internal Medicine

## 2016-09-19 ENCOUNTER — Other Ambulatory Visit: Payer: Self-pay

## 2016-09-19 NOTE — Patient Outreach (Signed)
[facility]) Care Management  [name]  09/19/2016   Patrick [name] 12-12-1955 [id number]  Subjective: Telephone call to patient for initial assessment. Patient reports he is doing well and is able to verify HIPAA.  Patient reports that he weighs at least 3 times a week but is not exercising. Encouraged patient to get back into exercising.  Patient following a low sodium diet.  Patient reports he still has his heart failure zone chart and uses it as a guide. Discussed with patient signs of heart failure and when to notify physician.  He verbalized understanding.  Patient has not seen primary care of cardiologist.  Discussed with patient the importance of seeing physicians at least yearly.  He verbalized understanding.  Patient uses [employer] transportation for appointments.  Patient has some issues with his vision as he has macular degeneration.  He states he has his aunt help him with reading his mail and other items.    Objective:   Encounter Medications:  Outpatient Encounter Prescriptions as of 09/19/2016  Medication Sig Note  . allopurinol (ZYLOPRIM) 100 MG tablet Take 1 tablet (100 mg total) by mouth daily. 09/19/2016: Taking twice a day  . aspirin EC 81 MG tablet Take 81 mg by mouth daily.   . calcitRIOL (ROCALTROL) 0.25 MCG capsule Take 0.25 mcg by mouth daily.    . carvedilol (COREG) 12.5 MG tablet TAKE 1 TABLET TWICE A DAY WITH A MEAL   . folic acid (FOLVITE) 1 MG tablet TAKE 1 TABLET (1 MG TOTAL) BY MOUTH DAILY.   . hydrALAZINE (APRESOLINE) 50 MG tablet Take 50 mg by mouth 2 (two) times daily.   . hydroxyurea (HYDREA) 500 MG capsule TAKE ONE CAPSULE EVERY DAY WITH FOOD TO MINIMIZE GI SIDE EFFECTS   . isosorbide mononitrate (IMDUR) 60 MG 24 hr tablet Take 1 tablet (60 mg total) by mouth daily.   Mar[place] Kitchen torsemide (DEMADEX) 100 MG tablet TAKE 1 TABLET EVERY DAY MAY TAKE ADDITIONAL TABLET AS NEEDED FOR SHORTNESS OF BREATH   . warfarin (COUMADIN) 5 MG tablet Take 1  tablet (5 mg total) by mouth daily at 6 PM. Or as directed by Coumadin Clinic   . allopurinol (ZYLOPRIM) 100 MG tablet TAKE 1 TABLET BY MOUTH EVERY DAY (Patient not taking: Reported on 02/18/7901) 4/0/9735: DUPLICATE   No facility-administered encounter medications on file as of 09/19/2016.     Functional Status:  In your present state of health, do you have any difficulty performing the following activities: 09/19/2016 09/11/2016  Hearing? N N  Vision? N N  Difficulty concentrating or making decisions? N N  Walking or climbing stairs? N N  Dressing or bathing? N N  Doing errands, shopping? Y -  Conservation officer, nature and eating ? N -  Using the Toilet? N -  In the past six months, have you accidently leaked urine? N -  Do you have problems with loss of bowel control? N -  Managing your Medications? N -  Managing your Finances? Y -  Housekeeping or managing your Housekeeping? Y -  Some recent data might be hidden    Fall/Depression Screening: PHQ 2/9 Scores 09/19/2016 09/10/2016 04/12/2016 03/14/2016 01/10/2016 01/06/2016 10/25/2015  PHQ - 2 Score 0 2 0 0 2 2 0  PHQ- 9 Score - 4 - - 2 3 -    Assessment: Patient will benefit from health coach outreach for support and education for heart failure.    Plan:  [facility] CM Care Plan Problem Two  Most Recent Value  Care Plan Problem Two  Heart failure Knowledge deficit  Role Documenting the Problem Two  [place] for Problem Two  Active  Interventions for Problem Two Long Term Goal   RN Health Coach discussed heart action plan and when to notify physician.    THN Long Term Goal (31-90) days  Patient will be able to verbalize heart failure zones within 90 days.   THN Long Term Goal Start Date  09/19/16  THN CM Short Term Goal #1 (0-30 days)  Patient will report maintaining low sodium diet within 30 days.  THN CM Short Term Goal #1 Start Date  09/19/16  Interventions for Short Term Goal #2   [place] discussed with patient limiting salt  in diet to decrease fluid retention.  THN CM Short Term Goal #2 (0-30 days)  Patient will continue to report weighing at least three times a week within 30 days.  THN CM Short Term Goal #2 Start Date  09/19/16  Interventions for Short Term Goal #2  [place] discussed with patient importance of weights.      RN Health Coach will provide ongoing education for patient on heart failure through phone calls.   RN Health Coach will send welcome packet with consent to patient.  RN Health Coach will send initial barriers letter, assessment, and care plan to primary care physician. RN Health Coach will contact patient in the month of May and patient agrees to next contact.    [name], RN, MSN [place] [phone]

## 2016-10-02 ENCOUNTER — Ambulatory Visit (INDEPENDENT_AMBULATORY_CARE_PROVIDER_SITE_OTHER): Payer: PPO | Admitting: *Deleted

## 2016-10-02 DIAGNOSIS — Z5181 Encounter for therapeutic drug level monitoring: Secondary | ICD-10-CM

## 2016-10-02 DIAGNOSIS — I059 Rheumatic mitral valve disease, unspecified: Secondary | ICD-10-CM

## 2016-10-02 DIAGNOSIS — I481 Persistent atrial fibrillation: Secondary | ICD-10-CM | POA: Diagnosis not present

## 2016-10-02 DIAGNOSIS — I4891 Unspecified atrial fibrillation: Secondary | ICD-10-CM

## 2016-10-02 DIAGNOSIS — I058 Other rheumatic mitral valve diseases: Secondary | ICD-10-CM

## 2016-10-02 DIAGNOSIS — I4819 Other persistent atrial fibrillation: Secondary | ICD-10-CM

## 2016-10-02 LAB — POCT INR: INR: 1.7

## 2016-10-09 ENCOUNTER — Encounter (HOSPITAL_COMMUNITY)
Admission: RE | Admit: 2016-10-09 | Discharge: 2016-10-09 | Disposition: A | Discharge status: Home / Self Care | Payer: PPO | Source: Ambulatory Visit | Attending: Nephrology | Admitting: Nephrology

## 2016-10-09 DIAGNOSIS — N184 Chronic kidney disease, stage 4 (severe): Secondary | ICD-10-CM | POA: Diagnosis not present

## 2016-10-09 DIAGNOSIS — D638 Anemia in other chronic diseases classified elsewhere: Secondary | ICD-10-CM | POA: Diagnosis not present

## 2016-10-09 LAB — POCT HEMOGLOBIN-HEMACUE: Hemoglobin: 8.8 g/dL — ABNORMAL LOW (ref 13.0–17.0)

## 2016-10-09 LAB — IRON AND TIBC
Iron: 85 ug/dL (ref 45–182)
Saturation Ratios: 25 % (ref 17.9–39.5)
TIBC: 344 ug/dL (ref 250–450)
UIBC: 259 ug/dL

## 2016-10-09 LAB — FERRITIN: Ferritin: 26 ng/mL (ref 24–336)

## 2016-10-09 MED ORDER — DARBEPOETIN ALFA 200 MCG/0.4ML IJ SOSY
200.0000 ug | PREFILLED_SYRINGE | INTRAMUSCULAR | Status: DC
  Administered 2016-10-09: 200 ug via SUBCUTANEOUS

## 2016-10-09 MED ORDER — DARBEPOETIN ALFA 200 MCG/0.4ML IJ SOSY
PREFILLED_SYRINGE | INTRAMUSCULAR | Status: AC
  Filled 2016-10-09: qty 0.4

## 2016-10-17 ENCOUNTER — Other Ambulatory Visit: Payer: Self-pay

## 2016-10-17 NOTE — Patient Outreach (Signed)
[place] [facility]) Care Management  10/17/2016  Patrick [name] 11-Jun-1956 [id number]   Telephone call to patient for monthly call.  No answer.  HIPAA compliant voice message left.   Plan: RN Health Coach will attempt patient again in the month of May.  [name], RN, MSN [place] [phone]

## 2016-10-18 ENCOUNTER — Other Ambulatory Visit: Payer: Self-pay | Admitting: Internal Medicine

## 2016-10-30 ENCOUNTER — Ambulatory Visit (INDEPENDENT_AMBULATORY_CARE_PROVIDER_SITE_OTHER): Payer: PPO | Admitting: *Deleted

## 2016-10-30 ENCOUNTER — Ambulatory Visit: Payer: Self-pay

## 2016-10-30 DIAGNOSIS — I059 Rheumatic mitral valve disease, unspecified: Secondary | ICD-10-CM | POA: Diagnosis not present

## 2016-10-30 DIAGNOSIS — I058 Other rheumatic mitral valve diseases: Secondary | ICD-10-CM

## 2016-10-30 DIAGNOSIS — Z5181 Encounter for therapeutic drug level monitoring: Secondary | ICD-10-CM

## 2016-10-30 DIAGNOSIS — I4819 Other persistent atrial fibrillation: Secondary | ICD-10-CM

## 2016-10-30 DIAGNOSIS — I481 Persistent atrial fibrillation: Secondary | ICD-10-CM

## 2016-10-30 DIAGNOSIS — D571 Sickle-cell disease without crisis: Secondary | ICD-10-CM | POA: Diagnosis not present

## 2016-10-30 DIAGNOSIS — D631 Anemia in chronic kidney disease: Secondary | ICD-10-CM | POA: Diagnosis not present

## 2016-10-30 DIAGNOSIS — I770 Arteriovenous fistula, acquired: Secondary | ICD-10-CM | POA: Diagnosis not present

## 2016-10-30 DIAGNOSIS — N184 Chronic kidney disease, stage 4 (severe): Secondary | ICD-10-CM | POA: Diagnosis not present

## 2016-10-30 DIAGNOSIS — I5020 Unspecified systolic (congestive) heart failure: Secondary | ICD-10-CM | POA: Diagnosis not present

## 2016-10-30 DIAGNOSIS — M109 Gout, unspecified: Secondary | ICD-10-CM | POA: Diagnosis not present

## 2016-10-30 DIAGNOSIS — N2581 Secondary hyperparathyroidism of renal origin: Secondary | ICD-10-CM | POA: Diagnosis not present

## 2016-10-30 DIAGNOSIS — I4891 Unspecified atrial fibrillation: Secondary | ICD-10-CM | POA: Diagnosis not present

## 2016-10-30 DIAGNOSIS — Z720 Tobacco use: Secondary | ICD-10-CM | POA: Diagnosis not present

## 2016-10-30 DIAGNOSIS — I129 Hypertensive chronic kidney disease with stage 1 through stage 4 chronic kidney disease, or unspecified chronic kidney disease: Secondary | ICD-10-CM | POA: Diagnosis not present

## 2016-10-30 LAB — POCT INR: INR: 1.7

## 2016-11-01 ENCOUNTER — Other Ambulatory Visit: Payer: Self-pay

## 2016-11-01 NOTE — Patient Outreach (Signed)
[facility]) Care Management  [place]  11/01/2016   Patrick [name] 1955/12/15 [id number]  Subjective: Telephone call to patient for monthly call.  Patient reports he is doing good.  He reports that he saw Dr. Lorrene Reid on Tuesday and visit went well.  Patient continues to weigh and watch salt intake.  Reviewed with patient heart failure and when to notify physician.  He verbalized understanding.   Objective:   Encounter Medications:  Outpatient Encounter Prescriptions as of 11/01/2016  Medication Sig Note  . allopurinol (ZYLOPRIM) 100 MG tablet Take 1 tablet (100 mg total) by mouth daily. 09/19/2016: Taking twice a day  . aspirin EC 81 MG tablet Take 81 mg by mouth daily.   . calcitRIOL (ROCALTROL) 0.25 MCG capsule Take 0.25 mcg by mouth daily.    . carvedilol (COREG) 12.5 MG tablet TAKE 1 TABLET TWICE A DAY WITH A MEAL   . folic acid (FOLVITE) 1 MG tablet TAKE 1 TABLET (1 MG TOTAL) BY MOUTH DAILY.   . hydrALAZINE (APRESOLINE) 50 MG tablet Take 50 mg by mouth 2 (two) times daily.   . hydroxyurea (HYDREA) 500 MG capsule TAKE ONE CAPSULE EVERY DAY WITH FOOD TO MINIMIZE GI SIDE EFFECTS   . isosorbide mononitrate (IMDUR) 60 MG 24 hr tablet Take 1 tablet (60 mg total) by mouth daily.   . isosorbide mononitrate (IMDUR) 60 MG 24 hr tablet TAKE 1 TABLET BY MOUTH EVERY DAY   . torsemide (DEMADEX) 100 MG tablet TAKE 1 TABLET EVERY DAY MAY TAKE ADDITIONAL TABLET AS NEEDED FOR SHORTNESS OF BREATH   . warfarin (COUMADIN) 5 MG tablet TAKE 1 TABLET BY MOUTH EVERY DAY AT 6PM OR AS DIRECTED BY COUMADIN CLINIC   . allopurinol (ZYLOPRIM) 100 MG tablet TAKE 1 TABLET BY MOUTH EVERY DAY (Patient not taking: Reported on 07/22/5571) 07/20/252: DUPLICATE   No facility-administered encounter medications on file as of 11/01/2016.     Functional Status:  In your present state of health, do you have any difficulty performing the following activities: 10/09/2016 09/19/2016  Hearing? N N   Vision? N N  Difficulty concentrating or making decisions? N N  Walking or climbing stairs? N N  Dressing or bathing? N N  Doing errands, shopping? - Y  Conservation officer, nature and eating ? - N  Using the Toilet? - N  In the past six months, have you accidently leaked urine? - N  Do you have problems with loss of bowel control? - N  Managing your Medications? - N  Managing your Finances? - Y  Housekeeping or managing your Housekeeping? - Y  Some recent data might be hidden    Fall/Depression Screening: Fall Risk  11/01/2016 09/19/2016 04/12/2016  Falls in the past year? No No No  Risk for fall due to : - - -  Risk for fall due to (comments): - - -   PHQ 2/9 Scores 11/01/2016 09/19/2016 09/10/2016 04/12/2016 03/14/2016 01/10/2016 01/06/2016  PHQ - 2 Score 0 0 2 0 0 2 2  PHQ- 9 Score - - 4 - - 2 3    Assessment: Patient continues to benefit from health coach outreach for disease management and support.    Plan:  [facility] CM Care Plan Problem Two     Most Recent Value  Care Plan Problem Two  Heart failure Knowledge deficit  Role Documenting the Problem Two  [place] for Problem Two  Active  Interventions for Problem Two Long Term  Goal   RN Health Coach reviewed heart action plan and when to notify physician.    THN Long Term Goal (31-90) days  Patient will be able to verbalize heart failure zones within 90 days.   THN Long Term Goal Start Date  11/01/16  [facility] CM Short Term Goal #1 Met Date   11/01/16  Interventions for Short Term Goal #2   patient reports limiting salt  THN CM Short Term Goal #2 (0-30 days)  Patient will continue to report weighing at least three times a week within 30 days.  THN CM Short Term Goal #2 Met Date  11/01/16  Interventions for Short Term Goal #2  patient weighing at least 3 times a week.       RN Health Coach will contact patient in the month of June and patient agrees to next outreach.  [name], RN, MSN [place] [phone]

## 2016-11-06 ENCOUNTER — Encounter (HOSPITAL_COMMUNITY)
Admission: RE | Admit: 2016-11-06 | Discharge: 2016-11-06 | Disposition: A | Discharge status: Home / Self Care | Payer: PPO | Source: Ambulatory Visit | Attending: Nephrology | Admitting: Nephrology

## 2016-11-06 DIAGNOSIS — N184 Chronic kidney disease, stage 4 (severe): Secondary | ICD-10-CM | POA: Diagnosis not present

## 2016-11-06 DIAGNOSIS — D638 Anemia in other chronic diseases classified elsewhere: Secondary | ICD-10-CM | POA: Insufficient documentation

## 2016-11-06 LAB — IRON AND TIBC
Iron: 55 ug/dL (ref 45–182)
Saturation Ratios: 16 % — ABNORMAL LOW (ref 17.9–39.5)
TIBC: 349 ug/dL (ref 250–450)
UIBC: 294 ug/dL

## 2016-11-06 LAB — POCT HEMOGLOBIN-HEMACUE: Hemoglobin: 8.8 g/dL — ABNORMAL LOW (ref 13.0–17.0)

## 2016-11-06 LAB — FERRITIN: Ferritin: 27 ng/mL (ref 24–336)

## 2016-11-06 MED ORDER — DARBEPOETIN ALFA 200 MCG/0.4ML IJ SOSY
200.0000 ug | PREFILLED_SYRINGE | INTRAMUSCULAR | Status: DC
  Administered 2016-11-06: 200 ug via SUBCUTANEOUS

## 2016-11-13 ENCOUNTER — Ambulatory Visit (INDEPENDENT_AMBULATORY_CARE_PROVIDER_SITE_OTHER): Payer: PPO | Admitting: *Deleted

## 2016-11-13 DIAGNOSIS — I481 Persistent atrial fibrillation: Secondary | ICD-10-CM | POA: Diagnosis not present

## 2016-11-13 DIAGNOSIS — I058 Other rheumatic mitral valve diseases: Secondary | ICD-10-CM

## 2016-11-13 DIAGNOSIS — I059 Rheumatic mitral valve disease, unspecified: Secondary | ICD-10-CM

## 2016-11-13 DIAGNOSIS — I4891 Unspecified atrial fibrillation: Secondary | ICD-10-CM | POA: Diagnosis not present

## 2016-11-13 DIAGNOSIS — Z5181 Encounter for therapeutic drug level monitoring: Secondary | ICD-10-CM

## 2016-11-13 DIAGNOSIS — I4819 Other persistent atrial fibrillation: Secondary | ICD-10-CM

## 2016-11-13 LAB — POCT INR: INR: 2.5

## 2016-11-21 DIAGNOSIS — D571 Sickle-cell disease without crisis: Secondary | ICD-10-CM | POA: Diagnosis not present

## 2016-11-21 DIAGNOSIS — H353212 Exudative age-related macular degeneration, right eye, with inactive choroidal neovascularization: Secondary | ICD-10-CM | POA: Diagnosis not present

## 2016-11-21 DIAGNOSIS — H353134 Nonexudative age-related macular degeneration, bilateral, advanced atrophic with subfoveal involvement: Secondary | ICD-10-CM | POA: Diagnosis not present

## 2016-11-21 DIAGNOSIS — H353222 Exudative age-related macular degeneration, left eye, with inactive choroidal neovascularization: Secondary | ICD-10-CM | POA: Diagnosis not present

## 2016-12-02 ENCOUNTER — Other Ambulatory Visit: Payer: Self-pay | Admitting: Internal Medicine

## 2016-12-04 ENCOUNTER — Encounter (HOSPITAL_COMMUNITY)
Admission: RE | Admit: 2016-12-04 | Discharge: 2016-12-04 | Disposition: A | Discharge status: Home / Self Care | Payer: PPO | Source: Ambulatory Visit | Attending: Nephrology | Admitting: Nephrology

## 2016-12-04 ENCOUNTER — Other Ambulatory Visit: Payer: Self-pay

## 2016-12-04 DIAGNOSIS — D638 Anemia in other chronic diseases classified elsewhere: Secondary | ICD-10-CM | POA: Insufficient documentation

## 2016-12-04 DIAGNOSIS — N184 Chronic kidney disease, stage 4 (severe): Secondary | ICD-10-CM | POA: Insufficient documentation

## 2016-12-04 LAB — IRON AND TIBC
Iron: 63 ug/dL (ref 45–182)
Saturation Ratios: 19 % (ref 17.9–39.5)
TIBC: 335 ug/dL (ref 250–450)
UIBC: 272 ug/dL

## 2016-12-04 LAB — FERRITIN: Ferritin: 22 ng/mL — ABNORMAL LOW (ref 24–336)

## 2016-12-04 LAB — POCT HEMOGLOBIN-HEMACUE: Hemoglobin: 8.6 g/dL — ABNORMAL LOW (ref 13.0–17.0)

## 2016-12-04 MED ORDER — DARBEPOETIN ALFA 200 MCG/0.4ML IJ SOSY
200.0000 ug | PREFILLED_SYRINGE | INTRAMUSCULAR | Status: DC
  Administered 2016-12-04: 200 ug via SUBCUTANEOUS

## 2016-12-04 MED ORDER — DARBEPOETIN ALFA 200 MCG/0.4ML IJ SOSY
PREFILLED_SYRINGE | INTRAMUSCULAR | Status: AC
  Filled 2016-12-04: qty 0.4

## 2016-12-04 NOTE — Patient Outreach (Signed)
[place] [facility]) Care Management  [place]  12/04/2016   DAMARIS GEERS May 19, 1956 [id number]  Subjective: Telephone call to patient for monthly call.  Patient reports that he is doing ok.  Patient reports that weight is stable at 155 lbs.  Patient knows signs of heart failure.  Reiterated with patient importance of knowing the signs of heart failure but also when to notify physician.  He verbalized understanding.  Patient reports that sometimes he feels down but is doing ok now. Patient expressed some grief over losing his independence due to his vision. He does not like to depend on others to take him where he needs to go.  Encouraged patient to continue to go out as much as he can and asked about family.  He states that he does have cousins that come over sometime to take him to the store and appointments sometime.  Advised patient to remain encouraged continue to go to his support group. He verbalized understanding.   Objective:   Encounter Medications:  Outpatient Encounter Prescriptions as of 12/04/2016  Medication Sig Note  . allopurinol (ZYLOPRIM) 100 MG tablet Take 1 tablet (100 mg total) by mouth daily. 09/19/2016: Taking twice a day  . aspirin EC 81 MG tablet Take 81 mg by mouth daily.   . calcitRIOL (ROCALTROL) 0.25 MCG capsule Take 0.25 mcg by mouth daily.    . carvedilol (COREG) 12.5 MG tablet TAKE 1 TABLET TWICE A DAY WITH A MEAL   . folic acid (FOLVITE) 1 MG tablet TAKE 1 TABLET (1 MG TOTAL) BY MOUTH DAILY.   . hydrALAZINE (APRESOLINE) 50 MG tablet Take 50 mg by mouth 2 (two) times daily.   . hydroxyurea (HYDREA) 500 MG capsule TAKE ONE CAPSULE EVERY DAY WITH FOOD TO MINIMIZE GI SIDE EFFECTS   . isosorbide mononitrate (IMDUR) 60 MG 24 hr tablet Take 1 tablet (60 mg total) by mouth daily. NEEDS OFFICE VISIT   . torsemide (DEMADEX) 100 MG tablet TAKE 1 TABLET EVERY DAY MAY TAKE ADDITIONAL TABLET AS NEEDED FOR SHORTNESS OF BREATH   . warfarin (COUMADIN) 5  MG tablet TAKE 1 TABLET BY MOUTH EVERY DAY AT 6PM OR AS DIRECTED BY COUMADIN CLINIC   . allopurinol (ZYLOPRIM) 100 MG tablet TAKE 1 TABLET BY MOUTH EVERY DAY (Patient not taking: Reported on 12/20/9447) 11/23/5914: DUPLICATE  . isosorbide mononitrate (IMDUR) 60 MG 24 hr tablet Take 1 tablet (60 mg total) by mouth daily. (Patient not taking: Reported on 12/04/2016)    Facility-Administered Encounter Medications as of 12/04/2016  Medication  . Darbepoetin Alfa (ARANESP) injection 200 mcg    Functional Status:  In your present state of health, do you have any difficulty performing the following activities: 12/04/2016 10/09/2016  Hearing? N N  Vision? N N  Difficulty concentrating or making decisions? N N  Walking or climbing stairs? N N  Dressing or bathing? N N  Doing errands, shopping? - Facilities manager and eating ? - -  Using the Toilet? - -  In the past six months, have you accidently leaked urine? - -  Do you have problems with loss of bowel control? - -  Managing your Medications? - -  Managing your Finances? - -  Housekeeping or managing your Housekeeping? - -  Some recent data might be hidden    Fall/Depression Screening: Fall Risk  12/04/2016 11/01/2016 09/19/2016  Falls in the past year? No No No  Risk for fall due to : - - -  Risk for fall due to (comments): - - -   PHQ 2/9 Scores 12/04/2016 11/01/2016 09/19/2016 09/10/2016 04/12/2016 03/14/2016 01/10/2016  PHQ - 2 Score 0 0 0 2 0 0 2  PHQ- 9 Score - - - 4 - - 2    Assessment: Patient continues to benefit from health coach outreach for disease management and support.    Plan:  [facility] CM Care Plan Problem Two     Most Recent Value  Care Plan Problem Two  Heart failure Knowledge deficit  Role Documenting the Problem Two  [place] for Problem Two  Active  Interventions for Problem Two Long Term Goal   RN Health Coach reiterated heart action plan and when to notify physician.    THN Long Term Goal  Patient will be able  to verbalize heart failure zones within 90 days.   THN Long Term Goal Start Date  12/04/16 [facility] continued]     [place] will contact patient in the month of July and patient agrees to next outreach.  [name], RN, MSN [place] [phone]

## 2016-12-10 ENCOUNTER — Ambulatory Visit (INDEPENDENT_AMBULATORY_CARE_PROVIDER_SITE_OTHER): Payer: PPO | Admitting: *Deleted

## 2016-12-10 DIAGNOSIS — I4891 Unspecified atrial fibrillation: Secondary | ICD-10-CM | POA: Diagnosis not present

## 2016-12-10 DIAGNOSIS — I4819 Other persistent atrial fibrillation: Secondary | ICD-10-CM

## 2016-12-10 DIAGNOSIS — I059 Rheumatic mitral valve disease, unspecified: Secondary | ICD-10-CM | POA: Diagnosis not present

## 2016-12-10 DIAGNOSIS — Z5181 Encounter for therapeutic drug level monitoring: Secondary | ICD-10-CM | POA: Diagnosis not present

## 2016-12-10 DIAGNOSIS — I481 Persistent atrial fibrillation: Secondary | ICD-10-CM

## 2016-12-10 DIAGNOSIS — I058 Other rheumatic mitral valve diseases: Secondary | ICD-10-CM

## 2016-12-10 LAB — POCT INR: INR: 2.7

## 2016-12-15 ENCOUNTER — Other Ambulatory Visit: Payer: Self-pay | Admitting: Family Medicine

## 2016-12-22 ENCOUNTER — Other Ambulatory Visit (HOSPITAL_COMMUNITY): Payer: Self-pay | Admitting: Student

## 2016-12-22 DIAGNOSIS — I5033 Acute on chronic diastolic (congestive) heart failure: Secondary | ICD-10-CM

## 2016-12-25 ENCOUNTER — Other Ambulatory Visit: Payer: Self-pay

## 2016-12-25 NOTE — Patient Outreach (Signed)
[place] [facility]) Care Management  12/25/2016  Patrick [name] 1956-01-28 [id number]   Telephone call to patient for monthly call. No answer. HIPAA compliant voice message left.  Plan: RN Health Coach will attempt patient again in the month of July.  [name], RN, MSN [place] [phone]

## 2017-01-01 ENCOUNTER — Encounter (HOSPITAL_COMMUNITY)
Admission: RE | Admit: 2017-01-01 | Discharge: 2017-01-01 | Disposition: A | Discharge status: Home / Self Care | Payer: PPO | Source: Ambulatory Visit | Attending: Nephrology | Admitting: Nephrology

## 2017-01-01 DIAGNOSIS — N184 Chronic kidney disease, stage 4 (severe): Secondary | ICD-10-CM | POA: Insufficient documentation

## 2017-01-01 DIAGNOSIS — D638 Anemia in other chronic diseases classified elsewhere: Secondary | ICD-10-CM | POA: Diagnosis not present

## 2017-01-01 LAB — IRON AND TIBC
Iron: 40 ug/dL — ABNORMAL LOW (ref 45–182)
Saturation Ratios: 12 % — ABNORMAL LOW (ref 17.9–39.5)
TIBC: 340 ug/dL (ref 250–450)
UIBC: 300 ug/dL

## 2017-01-01 LAB — FERRITIN: Ferritin: 16 ng/mL — ABNORMAL LOW (ref 24–336)

## 2017-01-01 LAB — POCT HEMOGLOBIN-HEMACUE: Hemoglobin: 8 g/dL — ABNORMAL LOW (ref 13.0–17.0)

## 2017-01-01 MED ORDER — DARBEPOETIN ALFA 200 MCG/0.4ML IJ SOSY
PREFILLED_SYRINGE | INTRAMUSCULAR | Status: AC
  Filled 2017-01-01: qty 0.4

## 2017-01-01 MED ORDER — DARBEPOETIN ALFA 200 MCG/0.4ML IJ SOSY
200.0000 ug | PREFILLED_SYRINGE | INTRAMUSCULAR | Status: DC
  Administered 2017-01-01: 200 ug via SUBCUTANEOUS

## 2017-01-03 DIAGNOSIS — N184 Chronic kidney disease, stage 4 (severe): Secondary | ICD-10-CM | POA: Diagnosis not present

## 2017-01-03 DIAGNOSIS — I770 Arteriovenous fistula, acquired: Secondary | ICD-10-CM | POA: Diagnosis not present

## 2017-01-03 DIAGNOSIS — N2581 Secondary hyperparathyroidism of renal origin: Secondary | ICD-10-CM | POA: Diagnosis not present

## 2017-01-03 DIAGNOSIS — D631 Anemia in chronic kidney disease: Secondary | ICD-10-CM | POA: Diagnosis not present

## 2017-01-03 DIAGNOSIS — M109 Gout, unspecified: Secondary | ICD-10-CM | POA: Diagnosis not present

## 2017-01-03 DIAGNOSIS — I5020 Unspecified systolic (congestive) heart failure: Secondary | ICD-10-CM | POA: Diagnosis not present

## 2017-01-03 DIAGNOSIS — Z720 Tobacco use: Secondary | ICD-10-CM | POA: Diagnosis not present

## 2017-01-03 DIAGNOSIS — I4891 Unspecified atrial fibrillation: Secondary | ICD-10-CM | POA: Diagnosis not present

## 2017-01-03 DIAGNOSIS — I129 Hypertensive chronic kidney disease with stage 1 through stage 4 chronic kidney disease, or unspecified chronic kidney disease: Secondary | ICD-10-CM | POA: Diagnosis not present

## 2017-01-03 DIAGNOSIS — D571 Sickle-cell disease without crisis: Secondary | ICD-10-CM | POA: Diagnosis not present

## 2017-01-04 ENCOUNTER — Other Ambulatory Visit: Payer: Self-pay

## 2017-01-04 ENCOUNTER — Encounter: Payer: Self-pay | Admitting: Family

## 2017-01-04 NOTE — Patient Outreach (Signed)
[place] [facility]) Care Management  [place]  01/04/2017   KEIJI MELLAND 15-Feb-1956 [id number]  Subjective: Telephone call to patient for monthly call. Patient reports he is doing ok.  He reports that he saw Dr. Lorrene Reid and his iron was low and he is to get an iron injection on next week.  Patient reports he feels good and did not know his iron was low.   Patient reports his weight is steady at 155 lbs.  Patient states he weighs about every other day. Discussed with patient weight perimeters and other heart failure symptoms.  He verbalized understanding.   Objective:   Encounter Medications:  Outpatient Encounter Prescriptions as of 01/04/2017  Medication Sig Note  . allopurinol (ZYLOPRIM) 100 MG tablet Take 1 tablet (100 mg total) by mouth daily. 09/19/2016: Taking twice a day  . aspirin EC 81 MG tablet Take 81 mg by mouth daily.   . calcitRIOL (ROCALTROL) 0.25 MCG capsule Take 0.25 mcg by mouth daily.    . carvedilol (COREG) 12.5 MG tablet TAKE 1 TABLET TWICE A DAY WITH A MEAL   . folic acid (FOLVITE) 1 MG tablet TAKE 1 TABLET (1 MG TOTAL) BY MOUTH DAILY.   . hydrALAZINE (APRESOLINE) 50 MG tablet Take 50 mg by mouth 2 (two) times daily.   . hydroxyurea (HYDREA) 500 MG capsule TAKE ONE CAPSULE EVERY DAY WITH FOOD TO MINIMIZE GI SIDE EFFECTS   . isosorbide mononitrate (IMDUR) 60 MG 24 hr tablet Take 1 tablet (60 mg total) by mouth daily. NEEDS OFFICE VISIT   . torsemide (DEMADEX) 100 MG tablet TAKE 1 TABLET EVERY DAY MAY TAKE ADDITIONAL TABLET AS NEEDED FOR SHORTNESS OF BREATH   . warfarin (COUMADIN) 5 MG tablet TAKE 1 TABLET BY MOUTH EVERY DAY AT 6PM OR AS DIRECTED BY COUMADIN CLINIC   . allopurinol (ZYLOPRIM) 100 MG tablet TAKE 1 TABLET BY MOUTH EVERY DAY (Patient not taking: Reported on 07/25/2534) 11/19/4032: DUPLICATE  . isosorbide mononitrate (IMDUR) 60 MG 24 hr tablet Take 1 tablet (60 mg total) by mouth daily. (Patient not taking: Reported on 12/04/2016)    No  facility-administered encounter medications on file as of 01/04/2017.     Functional Status:  In your present state of health, do you have any difficulty performing the following activities: 01/01/2017 12/04/2016  Hearing? N N  Vision? N N  Difficulty concentrating or making decisions? N N  Walking or climbing stairs? N N  Dressing or bathing? N N  Doing errands, shopping? - Facilities manager and eating ? - -  Using the Toilet? - -  In the past six months, have you accidently leaked urine? - -  Do you have problems with loss of bowel control? - -  Managing your Medications? - -  Managing your Finances? - -  Housekeeping or managing your Housekeeping? - -  Some recent data might be hidden    Fall/Depression Screening: Fall Risk  01/04/2017 12/04/2016 11/01/2016  Falls in the past year? No No No  Risk for fall due to : - - -  Risk for fall due to (comments): - - -   PHQ 2/9 Scores 01/04/2017 12/04/2016 11/01/2016 09/19/2016 09/10/2016 04/12/2016 03/14/2016  PHQ - 2 Score 0 0 0 0 2 0 0  PHQ- 9 Score - - - - 4 - -    Assessment: Patient continues to benefit from health coach outreach for disease management and support.   Plan:  [place]  Problem Two     Most Recent Value  Care Plan Problem Two  Heart failure Knowledge deficit  Role Documenting the Problem Two  [place] for Problem Two  Active  Interventions for Problem Two Long Term Goal   RN Health Coach reviewed heart action plan and when to notify physician.    THN Long Term Goal  Patient will be able to verbalize heart failure zones within 90 days.   THN Long Term Goal Start Date  01/04/17 [facility] continued]     [place] will contact patient in the month of August and patient agrees to next outreach.  [name], RN, MSN [place] [phone]

## 2017-01-07 ENCOUNTER — Ambulatory Visit (INDEPENDENT_AMBULATORY_CARE_PROVIDER_SITE_OTHER): Payer: PPO | Admitting: *Deleted

## 2017-01-07 DIAGNOSIS — I4819 Other persistent atrial fibrillation: Secondary | ICD-10-CM

## 2017-01-07 DIAGNOSIS — Z5181 Encounter for therapeutic drug level monitoring: Secondary | ICD-10-CM | POA: Diagnosis not present

## 2017-01-07 DIAGNOSIS — I058 Other rheumatic mitral valve diseases: Secondary | ICD-10-CM

## 2017-01-07 DIAGNOSIS — I4891 Unspecified atrial fibrillation: Secondary | ICD-10-CM | POA: Diagnosis not present

## 2017-01-07 DIAGNOSIS — I059 Rheumatic mitral valve disease, unspecified: Secondary | ICD-10-CM | POA: Diagnosis not present

## 2017-01-07 DIAGNOSIS — I481 Persistent atrial fibrillation: Secondary | ICD-10-CM | POA: Diagnosis not present

## 2017-01-07 LAB — POCT INR: INR: 2.9

## 2017-01-09 ENCOUNTER — Encounter: Payer: Self-pay | Admitting: Family

## 2017-01-09 ENCOUNTER — Other Ambulatory Visit (HOSPITAL_COMMUNITY): Payer: Self-pay | Admitting: *Deleted

## 2017-01-10 ENCOUNTER — Encounter: Payer: Self-pay | Admitting: Family

## 2017-01-10 ENCOUNTER — Ambulatory Visit (INDEPENDENT_AMBULATORY_CARE_PROVIDER_SITE_OTHER): Payer: PPO | Admitting: Family

## 2017-01-10 ENCOUNTER — Encounter (HOSPITAL_COMMUNITY)
Admission: RE | Admit: 2017-01-10 | Discharge: 2017-01-10 | Disposition: A | Discharge status: Home / Self Care | Payer: PPO | Source: Ambulatory Visit | Attending: Nephrology | Admitting: Nephrology

## 2017-01-10 VITALS — BP 125/66 | HR 99 | Temp 98.3°F | Resp 16 | Ht 71.0 in | Wt 156.0 lb

## 2017-01-10 DIAGNOSIS — T82898A Other specified complication of vascular prosthetic devices, implants and grafts, initial encounter: Secondary | ICD-10-CM | POA: Diagnosis not present

## 2017-01-10 DIAGNOSIS — N184 Chronic kidney disease, stage 4 (severe): Secondary | ICD-10-CM | POA: Diagnosis not present

## 2017-01-10 MED ORDER — SODIUM CHLORIDE 0.9 % IV SOLN
510.0000 mg | Freq: Once | INTRAVENOUS | Status: AC
  Administered 2017-01-10: 510 mg via INTRAVENOUS
  Filled 2017-01-10: qty 17

## 2017-01-10 NOTE — Progress Notes (Signed)
Established Dialysis Access  History of Present Illness  Patrick [name] is a 61 y.o. (28-May-1956) male who presents status post right basilic vein transposition fistula creation on 04-12-14 by Dr. Oneida Alar.   Pt was last evaluated in our practice on 05-27-14 by Dr. Oneida Alar. At that time swelling of the right upper extremity had improved significantly since his prior office visit. He denied any numbness or tingling in his hand. He was not on dialysis at that time.  Incisions of right upper extremity were healing, audible bruit and palpable thrill in fistula. Edema extending from the wrist and slightly in the dorsum of the hand 2+ right radial pulse no ulcerations on the fingertips. Chest: No obvious chest wall collaterals Data: Recent duplex ultrasound of the patient fistula showed 8-10 mm diameter less than 6 mm from the skin no narrowing. Assessment: Right upper extremity swelling significantly improved. This may just be due to disruption of several venous branch collaterals from harvesting the basilic vein. Plan: Maturing fistula improving edema follow-up as needed.   Pt returns today with c/o aneurysmal degeneration of right upper arm AV fistula, referred by Dr. Lorrene Reid.  Pt reports occasional throbbing/pain at the distal end of the AV fistula.  He denies steal symptoms in right hand, denies chest pain or dyspnea. He is not on HD yet, and states Dr. Jose Persia told him there is no immediate need for HD.    Past Medical History:  Diagnosis Date  . Blood dyscrasia   . Blood transfusion    "I've had a bunch of them"  . CHF (congestive heart failure) ([place])    followed by hf clinic  . Chronic kidney disease (CKD), stage III (moderate)   . Gout   . HWEXHBZJ(696.7)    "probably weekly" (01/13/2014)  . Legally blind   . Shortness of breath dyspnea   . Sickle cell disease, type SS ([place])   . Swelling abdomen   . Swelling of extremity, left   . Swelling of extremity, right      Social History Social History  Substance Use Topics  . Smoking status: Former Smoker    Packs/day: 0.25    Years: 41.00    Types: Cigarettes    Quit date: 12/08/2015  . Smokeless tobacco: Never Used  . Alcohol use 1.8 oz/week    1 Glasses of wine, 2 Cans of beer per week     Comment: occasional    Family History Family History  Problem Relation Age of Onset  . Alcohol abuse Mother   . Liver disease Mother   . Cirrhosis Mother   . Heart attack Mother     Surgical History Past Surgical History:  Procedure Laterality Date  . BASCILIC VEIN TRANSPOSITION Right 04/12/2014   Procedure: BASILIC VEIN TRANSPOSITION;  Surgeon: Elam Dutch, MD;  Location: [place];  Service: Vascular;  Laterality: Right;  . CARDIAC CATHETERIZATION  05/2011  . LEFT AND RIGHT HEART CATHETERIZATION WITH CORONARY ANGIOGRAM N/A 06/11/2011   Procedure: LEFT AND RIGHT HEART CATHETERIZATION WITH CORONARY ANGIOGRAM;  Surgeon: Larey Dresser, MD;  Location: [facility] CATH LAB;  Service: Cardiovascular;  Laterality: N/A;  . TEE WITHOUT CARDIOVERSION N/A 12/27/2015   Procedure: TRANSESOPHAGEAL ECHOCARDIOGRAM (TEE);  Surgeon: Sueanne Margarita, MD;  Location: [facility] ENDOSCOPY;  Service: Cardiovascular;  Laterality: N/A;    No Known Allergies  Current Outpatient Prescriptions  Medication Sig Dispense Refill  . allopurinol (ZYLOPRIM) 100 MG tablet Take 1 tablet (100 mg total) by mouth daily.  90 tablet 0  . aspirin EC 81 MG tablet Take 81 mg by mouth daily.    . calcitRIOL (ROCALTROL) 0.25 MCG capsule Take 0.25 mcg by mouth daily.     . carvedilol (COREG) 12.5 MG tablet TAKE 1 TABLET TWICE A DAY WITH A MEAL 60 tablet 11  . folic acid (FOLVITE) 1 MG tablet TAKE 1 TABLET (1 MG TOTAL) BY MOUTH DAILY. 30 tablet 10  . hydrALAZINE (APRESOLINE) 50 MG tablet Take 50 mg by mouth 2 (two) times daily.    . hydroxyurea (HYDREA) 500 MG capsule TAKE ONE CAPSULE EVERY DAY WITH FOOD TO MINIMIZE GI SIDE EFFECTS 30 capsule 3  . isosorbide  mononitrate (IMDUR) 60 MG 24 hr tablet Take 1 tablet (60 mg total) by mouth daily. NEEDS OFFICE VISIT 30 tablet 1  . torsemide (DEMADEX) 100 MG tablet TAKE 1 TABLET EVERY DAY MAY TAKE ADDITIONAL TABLET AS NEEDED FOR SHORTNESS OF BREATH 45 tablet 3  . warfarin (COUMADIN) 5 MG tablet TAKE 1 TABLET BY MOUTH EVERY DAY AT 6PM OR AS DIRECTED BY COUMADIN CLINIC 35 tablet 2  . allopurinol (ZYLOPRIM) 100 MG tablet TAKE 1 TABLET BY MOUTH EVERY DAY (Patient not taking: Reported on 09/19/2016) 30 tablet 2  . isosorbide mononitrate (IMDUR) 60 MG 24 hr tablet Take 1 tablet (60 mg total) by mouth daily. (Patient not taking: Reported on 12/04/2016) 90 tablet 3   No current facility-administered medications for this visit.      REVIEW OF SYSTEMS: see HPI for pertinent positives and negatives    PHYSICAL EXAMINATION:  Vitals:   01/10/17 1309  BP: 125/66  Pulse: 99  Resp: 16  Temp: 98.3 F (36.8 C)  SpO2: 97%  Weight: 156 lb (70.8 kg)  Height: _0  (1.803 m)   Body mass index is 21.76 kg/m.  General: The patient appears their stated age.   HEENT:  No gross abnormalities Pulmonary: Respirations are non-labored, CTAB Abdomen: Soft and non-tender with normal pitched bowel sounds. Musculoskeletal: There are no major deformities.   Neurologic: No focal weakness or paresthesias are detected Skin: There are no ulcer or rashes noted. Psychiatric: The patient has normal affect. Cardiovascular: There is an irregular rhythm with controlled rate, without significant murmur appreciated. Generalized enlargement of right upper arm AV fistula with 2-3 aneurysmal dilations. His bilateral pedal pulses are palpable.     Outside Studies/Documentation 7 pages of outside documents were reviewed including: referral form, office note from 01-03-17 of Dr. Lorrene Reid, lab results.  Medical Decision Making  Patrick [name] is a 61 y.o. male who is status post right basilic vein transposition fistula creation on  04-12-14. Dr. Lorrene Reid requested evaluation in regards to enlargement of right upper am AV fistula with 2-3 aneurysms. Pt c/o intermittent throbbing at distal portion of AV fistula.  Pt is not yet in need of HD, his May 2018 eGFR was 16.  Dr. Oneida Alar spoke with and examined pt; offered to ligate this this if desired. Should avoid fistula gram to spare his renal function the contrast exposure.  Pt states he will think about and discuss with Dr. Lorrene Reid whether the AV fistula should be ligated, will let us know if this is what is desired.   Follow up with [place] as needed.   Loy Little, Sharmon Leyden, RN, MSN, FNP-C [facility] Office: [phone]  01/10/2017, 1:24 PM  Clinic MD: Oneida Alar

## 2017-01-12 ENCOUNTER — Other Ambulatory Visit (HOSPITAL_COMMUNITY): Payer: Self-pay | Admitting: Student

## 2017-01-12 DIAGNOSIS — I5033 Acute on chronic diastolic (congestive) heart failure: Secondary | ICD-10-CM

## 2017-01-29 ENCOUNTER — Encounter (HOSPITAL_COMMUNITY)
Admission: RE | Admit: 2017-01-29 | Discharge: 2017-01-29 | Disposition: A | Discharge status: Home / Self Care | Payer: PPO | Source: Ambulatory Visit | Attending: Nephrology | Admitting: Nephrology

## 2017-01-29 DIAGNOSIS — D638 Anemia in other chronic diseases classified elsewhere: Secondary | ICD-10-CM | POA: Diagnosis not present

## 2017-01-29 DIAGNOSIS — N184 Chronic kidney disease, stage 4 (severe): Secondary | ICD-10-CM | POA: Diagnosis not present

## 2017-01-29 LAB — FERRITIN: Ferritin: 60 ng/mL (ref 24–336)

## 2017-01-29 LAB — IRON AND TIBC
Iron: 61 ug/dL (ref 45–182)
Saturation Ratios: 18 % (ref 17.9–39.5)
TIBC: 335 ug/dL (ref 250–450)
UIBC: 274 ug/dL

## 2017-01-29 LAB — POCT HEMOGLOBIN-HEMACUE: Hemoglobin: 9.7 g/dL — ABNORMAL LOW (ref 13.0–17.0)

## 2017-01-29 MED ORDER — DARBEPOETIN ALFA 200 MCG/0.4ML IJ SOSY
200.0000 ug | PREFILLED_SYRINGE | INTRAMUSCULAR | Status: DC
  Administered 2017-01-29: 200 ug via SUBCUTANEOUS

## 2017-01-29 MED ORDER — DARBEPOETIN ALFA 200 MCG/0.4ML IJ SOSY
PREFILLED_SYRINGE | INTRAMUSCULAR | Status: AC
  Filled 2017-01-29: qty 0.4

## 2017-02-01 ENCOUNTER — Other Ambulatory Visit: Payer: Self-pay

## 2017-02-01 NOTE — Patient Outreach (Signed)
[place] [facility]) Care Management  02/01/2017  Patrick [name] [birth year] [id number]   Telephone call to patient for monthly call.  No answer.  HIPAA compliant voice message left.  Plan: RN Health Coach will attempt patient again in the month of August.    Patrick Starling J Marinna Blane, RN, MSN [place] [phone]'

## 2017-02-04 ENCOUNTER — Ambulatory Visit (INDEPENDENT_AMBULATORY_CARE_PROVIDER_SITE_OTHER): Payer: PPO | Admitting: Pharmacist

## 2017-02-04 DIAGNOSIS — Z5181 Encounter for therapeutic drug level monitoring: Secondary | ICD-10-CM

## 2017-02-04 DIAGNOSIS — I4891 Unspecified atrial fibrillation: Secondary | ICD-10-CM

## 2017-02-04 DIAGNOSIS — I058 Other rheumatic mitral valve diseases: Secondary | ICD-10-CM

## 2017-02-04 DIAGNOSIS — I059 Rheumatic mitral valve disease, unspecified: Secondary | ICD-10-CM | POA: Diagnosis not present

## 2017-02-04 DIAGNOSIS — I481 Persistent atrial fibrillation: Secondary | ICD-10-CM

## 2017-02-04 DIAGNOSIS — I4819 Other persistent atrial fibrillation: Secondary | ICD-10-CM

## 2017-02-04 LAB — POCT INR: INR: 2.3

## 2017-02-11 ENCOUNTER — Other Ambulatory Visit: Payer: Self-pay

## 2017-02-11 NOTE — Patient Outreach (Signed)
[name]) Care Management  [name]  02/11/2017   Patrick [name] [birth year] [id number]  Subjective: Telephone call to patient for monthly call.  Patient reports that he is doing good.  Offers no complaints or concerns.  Patient reports that his weight is around 155-156 lbs.  Patient denies swelling or shortness of breath.  Discussed with patient heart failure and when to notify physician. He verbalized understanding.    Objective:   Encounter Medications:  Outpatient Encounter Prescriptions as of 02/11/2017  Medication Sig Note  . allopurinol (ZYLOPRIM) 100 MG tablet Take 1 tablet (100 mg total) by mouth daily. 09/19/2016: Taking twice a day  . aspirin EC 81 MG tablet Take 81 mg by mouth daily.   . calcitRIOL (ROCALTROL) 0.25 MCG capsule Take 0.25 mcg by mouth daily.    . carvedilol (COREG) 12.5 MG tablet TAKE 1 TABLET TWICE A DAY WITH A MEAL   . folic acid (FOLVITE) 1 MG tablet TAKE 1 TABLET (1 MG TOTAL) BY MOUTH DAILY.   . hydrALAZINE (APRESOLINE) 50 MG tablet Take 50 mg by mouth 2 (two) times daily.   . hydroxyurea (HYDREA) 500 MG capsule TAKE ONE CAPSULE EVERY DAY WITH FOOD TO MINIMIZE GI SIDE EFFECTS   . isosorbide mononitrate (IMDUR) 60 MG 24 hr tablet Take 1 tablet (60 mg total) by mouth daily. NEEDS OFFICE VISIT   . torsemide (DEMADEX) 100 MG tablet TAKE 1 TABLET EVERY DAY MAY TAKE ADDITIONAL TABLET AS NEEDED FOR SHORTNESS OF BREATH   . warfarin (COUMADIN) 5 MG tablet TAKE 1 TABLET BY MOUTH EVERY DAY AT 6PM OR AS DIRECTED BY COUMADIN CLINIC   . allopurinol (ZYLOPRIM) 100 MG tablet TAKE 1 TABLET BY MOUTH EVERY DAY (Patient not taking: Reported on 12/16/6965) 01/24/3809: DUPLICATE  . isosorbide mononitrate (IMDUR) 60 MG 24 hr tablet Take 1 tablet (60 mg total) by mouth daily. (Patient not taking: Reported on 12/04/2016)   . torsemide (DEMADEX) 100 MG tablet TAKE 1 TABLET EVERY DAY MAY TAKE ADDITIONAL TABLET AS NEEDED FOR SHORTNESS OF BREATH (Patient not  taking: Reported on 02/11/2017)    No facility-administered encounter medications on file as of 02/11/2017.     Functional Status:  In your present state of health, do you have any difficulty performing the following activities: 01/29/2017 01/01/2017  Hearing? N N  Vision? N N  Difficulty concentrating or making decisions? N N  Walking or climbing stairs? N N  Dressing or bathing? N N  Doing errands, shopping? - Facilities manager and eating ? - -  Using the Toilet? - -  In the past six months, have you accidently leaked urine? - -  Do you have problems with loss of bowel control? - -  Managing your Medications? - -  Managing your Finances? - -  Housekeeping or managing your Housekeeping? - -  Some recent data might be hidden    Fall/Depression Screening: Fall Risk  02/11/2017 01/04/2017 12/04/2016  Falls in the past year? No No No  Risk for fall due to : - - -  Risk for fall due to: Comment - - -   PHQ 2/9 Scores 02/11/2017 01/04/2017 12/04/2016 11/01/2016 09/19/2016 09/10/2016 04/12/2016  PHQ - 2 Score 0 0 0 0 0 2 0  PHQ- 9 Score - - - - - 4 -    Assessment: Patient continues to benefit from health coach outreach for disease management and support.    Plan:  [facility] CM Care Plan Problem  Two     Most Recent Value  Care Plan Problem Two  Heart failure Knowledge deficit  Role Documenting the Problem Two  [place] for Problem Two  Active  Interventions for Problem Two Long Term Goal   RN Health Coach discussed heart action plan and when to notify physician.    THN Long Term Goal  Patient will be able to verbalize heart failure zones within 90 days.   THN Long Term Goal Start Date  02/11/17 [facility] continued]     [place] will contact patient in the month of September and patient agrees to next outreach.  [name], RN, MSN [place] [phone]

## 2017-02-13 ENCOUNTER — Other Ambulatory Visit: Payer: Self-pay | Admitting: Internal Medicine

## 2017-02-25 ENCOUNTER — Other Ambulatory Visit (HOSPITAL_COMMUNITY): Payer: Self-pay | Admitting: *Deleted

## 2017-02-26 ENCOUNTER — Encounter (HOSPITAL_COMMUNITY)
Admission: RE | Admit: 2017-02-26 | Discharge: 2017-02-26 | Disposition: A | Discharge status: Home / Self Care | Payer: PPO | Source: Ambulatory Visit | Attending: Nephrology | Admitting: Nephrology

## 2017-02-26 DIAGNOSIS — N184 Chronic kidney disease, stage 4 (severe): Secondary | ICD-10-CM | POA: Diagnosis not present

## 2017-02-26 DIAGNOSIS — D638 Anemia in other chronic diseases classified elsewhere: Secondary | ICD-10-CM | POA: Insufficient documentation

## 2017-02-26 LAB — IRON AND TIBC
Iron: 122 ug/dL (ref 45–182)
Saturation Ratios: 35 % (ref 17.9–39.5)
TIBC: 351 ug/dL (ref 250–450)
UIBC: 229 ug/dL

## 2017-02-26 LAB — POCT HEMOGLOBIN-HEMACUE: Hemoglobin: 9.7 g/dL — ABNORMAL LOW (ref 13.0–17.0)

## 2017-02-26 LAB — FERRITIN: Ferritin: 36 ng/mL (ref 24–336)

## 2017-02-26 MED ORDER — DARBEPOETIN ALFA 200 MCG/0.4ML IJ SOSY
PREFILLED_SYRINGE | INTRAMUSCULAR | Status: AC
  Filled 2017-02-26: qty 0.4

## 2017-02-26 MED ORDER — DARBEPOETIN ALFA 200 MCG/0.4ML IJ SOSY
200.0000 ug | PREFILLED_SYRINGE | INTRAMUSCULAR | Status: DC
  Administered 2017-02-26: 200 ug via SUBCUTANEOUS

## 2017-03-03 ENCOUNTER — Other Ambulatory Visit: Payer: Self-pay | Admitting: Family Medicine

## 2017-03-04 ENCOUNTER — Other Ambulatory Visit: Payer: Self-pay

## 2017-03-04 ENCOUNTER — Other Ambulatory Visit: Payer: Self-pay | Admitting: *Deleted

## 2017-03-04 MED ORDER — WARFARIN SODIUM 5 MG PO TABS: ORAL_TABLET | ORAL | 3 refills | Status: DC

## 2017-03-04 NOTE — Patient Outreach (Signed)
[place] [facility]) Care Management  [place]  03/04/2017   Patrick [name] Jul 30, 1955 [id number]  Subjective: Telephone call to patient for monthly call. Patient reports he is doing good.  He shares that he will be moving next month.  Advised patient to let me know of his new address on call next month.  He verbalized understanding.  Patient weight at 156 lbs.  Patient denies swelling or shortness of breath.  Discussed with patient heart failure and when to notify physician.  He verbalized understanding.   Objective:   Encounter Medications:  Outpatient Encounter Prescriptions as of 03/04/2017  Medication Sig Note  . allopurinol (ZYLOPRIM) 100 MG tablet Take 1 tablet (100 mg total) by mouth daily. 09/19/2016: Taking twice a day  . aspirin EC 81 MG tablet Take 81 mg by mouth daily.   . calcitRIOL (ROCALTROL) 0.25 MCG capsule Take 0.25 mcg by mouth daily.    . carvedilol (COREG) 12.5 MG tablet TAKE 1 TABLET TWICE A DAY WITH A MEAL   . folic acid (FOLVITE) 1 MG tablet TAKE 1 TABLET (1 MG TOTAL) BY MOUTH DAILY.   . hydrALAZINE (APRESOLINE) 50 MG tablet Take 50 mg by mouth 2 (two) times daily.   . hydroxyurea (HYDREA) 500 MG capsule TAKE ONE CAPSULE EVERY DAY WITH FOOD TO MINIMIZE GI SIDE EFFECTS   . isosorbide mononitrate (IMDUR) 60 MG 24 hr tablet Take 1 tablet (60 mg total) by mouth daily. NEEDS OFFICE VISIT   . torsemide (DEMADEX) 100 MG tablet TAKE 1 TABLET EVERY DAY MAY TAKE ADDITIONAL TABLET AS NEEDED FOR SHORTNESS OF BREATH   . warfarin (COUMADIN) 5 MG tablet TAKE 1 TABLET BY MOUTH EVERY DAY AT 6PM OR AS DIRECTED BY COUMADIN CLINIC   . allopurinol (ZYLOPRIM) 100 MG tablet TAKE 1 TABLET BY MOUTH EVERY DAY (Patient not taking: Reported on 2/0/1007) 06/19/1973: DUPLICATE  . folic acid (FOLVITE) 1 MG tablet TAKE 1 TABLET (1 MG TOTAL) BY MOUTH DAILY. (Patient not taking: Reported on 03/04/2017)   . isosorbide mononitrate (IMDUR) 60 MG 24 hr tablet Take 1 tablet (60 mg  total) by mouth daily. (Patient not taking: Reported on 03/04/2017)   . torsemide (DEMADEX) 100 MG tablet TAKE 1 TABLET EVERY DAY MAY TAKE ADDITIONAL TABLET AS NEEDED FOR SHORTNESS OF BREATH (Patient not taking: Reported on 02/11/2017)    No facility-administered encounter medications on file as of 03/04/2017.     Functional Status:  In your present state of health, do you have any difficulty performing the following activities: 01/29/2017 01/01/2017  Hearing? N N  Vision? N N  Difficulty concentrating or making decisions? N N  Walking or climbing stairs? N N  Dressing or bathing? N N  Doing errands, shopping? - Facilities manager and eating ? - -  Using the Toilet? - -  In the past six months, have you accidently leaked urine? - -  Do you have problems with loss of bowel control? - -  Managing your Medications? - -  Managing your Finances? - -  Housekeeping or managing your Housekeeping? - -  Some recent data might be hidden    Fall/Depression Screening: Fall Risk  03/04/2017 02/11/2017 01/04/2017  Falls in the past year? No No No  Risk for fall due to : - - -  Risk for fall due to: Comment - - -   PHQ 2/9 Scores 03/04/2017 02/11/2017 01/04/2017 12/04/2016 11/01/2016 09/19/2016 09/10/2016  PHQ - 2 Score 0 0 0 0  0 0 2  PHQ- 9 Score - - - - - - 4    Assessment: Patient continues to benefit from health coach outreach for disease management and support.    Plan:  [place] [facility] CM Care Plan Problem Two     Most Recent Value  Care Plan Problem Two  Heart failure Knowledge deficit  Role Documenting the Problem Two  [place] for Problem Two  Active  Interventions for Problem Two Long Term Goal   RN Health Coach reiterated heart action plan and when to notify physician.    THN Long Term Goal  Patient will be able to verbalize heart failure zones within 90 days.   THN Long Term Goal Start Date  03/04/17 [facility] continued]     [place] will contact patient in the month of October and  patient agrees to next outreach.  [name], RN, MSN [place] [phone]

## 2017-03-04 NOTE — Telephone Encounter (Signed)
Pharmacy requests ninety days. 

## 2017-03-05 ENCOUNTER — Other Ambulatory Visit: Payer: Self-pay

## 2017-03-05 MED ORDER — WARFARIN SODIUM 5 MG PO TABS: ORAL_TABLET | ORAL | 3 refills | Status: DC

## 2017-03-07 DIAGNOSIS — N184 Chronic kidney disease, stage 4 (severe): Secondary | ICD-10-CM | POA: Diagnosis not present

## 2017-03-07 DIAGNOSIS — N2581 Secondary hyperparathyroidism of renal origin: Secondary | ICD-10-CM | POA: Diagnosis not present

## 2017-03-07 DIAGNOSIS — I770 Arteriovenous fistula, acquired: Secondary | ICD-10-CM | POA: Diagnosis not present

## 2017-03-07 DIAGNOSIS — Z720 Tobacco use: Secondary | ICD-10-CM | POA: Diagnosis not present

## 2017-03-07 DIAGNOSIS — I4891 Unspecified atrial fibrillation: Secondary | ICD-10-CM | POA: Diagnosis not present

## 2017-03-07 DIAGNOSIS — I5020 Unspecified systolic (congestive) heart failure: Secondary | ICD-10-CM | POA: Diagnosis not present

## 2017-03-07 DIAGNOSIS — I129 Hypertensive chronic kidney disease with stage 1 through stage 4 chronic kidney disease, or unspecified chronic kidney disease: Secondary | ICD-10-CM | POA: Diagnosis not present

## 2017-03-07 DIAGNOSIS — D631 Anemia in chronic kidney disease: Secondary | ICD-10-CM | POA: Diagnosis not present

## 2017-03-07 DIAGNOSIS — D571 Sickle-cell disease without crisis: Secondary | ICD-10-CM | POA: Diagnosis not present

## 2017-03-07 DIAGNOSIS — Z23 Encounter for immunization: Secondary | ICD-10-CM | POA: Diagnosis not present

## 2017-03-07 DIAGNOSIS — M109 Gout, unspecified: Secondary | ICD-10-CM | POA: Diagnosis not present

## 2017-03-26 ENCOUNTER — Encounter (HOSPITAL_COMMUNITY)
Admission: RE | Admit: 2017-03-26 | Discharge: 2017-03-26 | Disposition: A | Discharge status: Home / Self Care | Payer: PPO | Source: Ambulatory Visit | Attending: Nephrology | Admitting: Nephrology

## 2017-03-26 DIAGNOSIS — N184 Chronic kidney disease, stage 4 (severe): Secondary | ICD-10-CM

## 2017-03-26 DIAGNOSIS — D638 Anemia in other chronic diseases classified elsewhere: Secondary | ICD-10-CM | POA: Insufficient documentation

## 2017-03-26 LAB — IRON AND TIBC
Iron: 36 ug/dL — ABNORMAL LOW (ref 45–182)
Saturation Ratios: 10 % — ABNORMAL LOW (ref 17.9–39.5)
TIBC: 361 ug/dL (ref 250–450)
UIBC: 325 ug/dL

## 2017-03-26 LAB — FERRITIN: Ferritin: 16 ng/mL — ABNORMAL LOW (ref 24–336)

## 2017-03-26 LAB — POCT HEMOGLOBIN-HEMACUE: Hemoglobin: 6.6 g/dL — CL (ref 13.0–17.0)

## 2017-03-26 MED ORDER — DARBEPOETIN ALFA 200 MCG/0.4ML IJ SOSY
200.0000 ug | PREFILLED_SYRINGE | INTRAMUSCULAR | Status: DC
  Administered 2017-03-26: 200 ug via SUBCUTANEOUS

## 2017-03-26 MED ORDER — DARBEPOETIN ALFA 200 MCG/0.4ML IJ SOSY
PREFILLED_SYRINGE | INTRAMUSCULAR | Status: AC
  Administered 2017-03-26: 200 ug via SUBCUTANEOUS
  Filled 2017-03-26: qty 0.4

## 2017-03-26 NOTE — Progress Notes (Signed)
Hemocue today 6.5, recheck done and resulted 6.6.  Pt denies CP, SOB, or any visible signs of bleeding.  Pt's bottom eyelids assessed and appear pale. Reported the above to [place] at [facility].  Awaiting MD return call

## 2017-03-26 NOTE — Progress Notes (Signed)
Stacy from [place] called and stated that pt is to receive Aranesp injection as ordered and that pt will be getting a call from [place] in their office to arrange for a blood transfusion and stool sample cards. Pt states understanding and also states that he will call them as his phone is currently being changed to a new number.

## 2017-03-27 ENCOUNTER — Other Ambulatory Visit: Payer: Self-pay

## 2017-03-27 NOTE — Patient Outreach (Signed)
[place] [facility]) Care Management  [place]  03/27/2017   Patrick [name] [birth year] [id number]  Subjective: Telephone call to patient for monthly call. Patient reports he is doing ok.  He verifies new address and states his new place is fine.  His weight is the same.  Reinforced with patient heart failure and notifying physician. He verbalized understanding.    Objective:   Encounter Medications:  Outpatient Encounter Prescriptions as of 03/27/2017  Medication Sig Note  . allopurinol (ZYLOPRIM) 100 MG tablet Take 1 tablet (100 mg total) by mouth daily. 09/19/2016: Taking twice a day  . aspirin EC 81 MG tablet Take 81 mg by mouth daily.   . calcitRIOL (ROCALTROL) 0.25 MCG capsule Take 0.25 mcg by mouth daily.    . carvedilol (COREG) 12.5 MG tablet TAKE 1 TABLET TWICE A DAY WITH A MEAL   . folic acid (FOLVITE) 1 MG tablet TAKE 1 TABLET (1 MG TOTAL) BY MOUTH DAILY.   . hydrALAZINE (APRESOLINE) 50 MG tablet Take 50 mg by mouth 2 (two) times daily.   . hydroxyurea (HYDREA) 500 MG capsule TAKE ONE CAPSULE EVERY DAY WITH FOOD TO MINIMIZE GI SIDE EFFECTS   . isosorbide mononitrate (IMDUR) 60 MG 24 hr tablet Take 1 tablet (60 mg total) by mouth daily. NEEDS OFFICE VISIT   . torsemide (DEMADEX) 100 MG tablet TAKE 1 TABLET EVERY DAY MAY TAKE ADDITIONAL TABLET AS NEEDED FOR SHORTNESS OF BREATH   . warfarin (COUMADIN) 5 MG tablet TAKE 1 TABLET BY MOUTH EVERY DAY AT 6PM OR AS DIRECTED BY COUMADIN CLINIC   . allopurinol (ZYLOPRIM) 100 MG tablet TAKE 1 TABLET BY MOUTH EVERY DAY (Patient not taking: Reported on 10/21/8125) 10/16/6999: DUPLICATE  . folic acid (FOLVITE) 1 MG tablet TAKE 1 TABLET (1 MG TOTAL) BY MOUTH DAILY. (Patient not taking: Reported on 03/04/2017)   . isosorbide mononitrate (IMDUR) 60 MG 24 hr tablet Take 1 tablet (60 mg total) by mouth daily. (Patient not taking: Reported on 03/04/2017)   . torsemide (DEMADEX) 100 MG tablet TAKE 1 TABLET EVERY DAY MAY TAKE ADDITIONAL  TABLET AS NEEDED FOR SHORTNESS OF BREATH (Patient not taking: Reported on 02/11/2017)    No facility-administered encounter medications on file as of 03/27/2017.     Functional Status:  In your present state of health, do you have any difficulty performing the following activities: 01/29/2017 01/01/2017  Hearing? N N  Vision? N N  Difficulty concentrating or making decisions? N N  Walking or climbing stairs? N N  Dressing or bathing? N N  Doing errands, shopping? - Facilities manager and eating ? - -  Using the Toilet? - -  In the past six months, have you accidently leaked urine? - -  Do you have problems with loss of bowel control? - -  Managing your Medications? - -  Managing your Finances? - -  Housekeeping or managing your Housekeeping? - -  Some recent data might be hidden    Fall/Depression Screening: Fall Risk  03/27/2017 03/04/2017 02/11/2017  Falls in the past year? No No No  Risk for fall due to : - - -  Risk for fall due to: Comment - - -   PHQ 2/9 Scores 03/27/2017 03/04/2017 02/11/2017 01/04/2017 12/04/2016 11/01/2016 09/19/2016  PHQ - 2 Score 0 0 0 0 0 0 0  PHQ- 9 Score - - - - - - -    Assessment: Patient continues to benefit from health coach outreach for  disease management and support.    Plan:  [facility] CM Care Plan Problem Two     Most Recent Value  Care Plan Problem Two  (P) Heart failure Knowledge deficit  Role Documenting the Problem Two  (P) [place] for Problem Two  (P) Active  Interventions for Problem Two Long Term Goal   (P) RN Health Coach reiterated heart action plan and when to notify physician.    THN Long Term Goal  (P) Patient will be able to verbalize heart failure zones within 90 days.   THN Long Term Goal Start Date  (P) 03/04/17 [goal continued]     RN Health Coach will contact patient in the month of November and patient agrees to next outreach.  [name], RN, MSN [facility] Care Management Care Management Telephonic  Coordinator Direct Line [phone] Toll Free: [phone]  Fax: [phone]

## 2017-03-28 ENCOUNTER — Ambulatory Visit (HOSPITAL_COMMUNITY)
Admission: RE | Admit: 2017-03-28 | Discharge: 2017-03-28 | Disposition: A | Discharge status: Home / Self Care | Payer: PPO | Source: Ambulatory Visit | Attending: Chiropractic Medicine | Admitting: Chiropractic Medicine

## 2017-03-28 VITALS — BP 137/66 | HR 77 | Temp 98.2°F | Resp 18

## 2017-03-28 DIAGNOSIS — D649 Anemia, unspecified: Secondary | ICD-10-CM | POA: Insufficient documentation

## 2017-03-28 LAB — PREPARE RBC (CROSSMATCH)

## 2017-03-28 MED ORDER — SODIUM CHLORIDE 0.9 % IV SOLN
Freq: Once | INTRAVENOUS | Status: AC
  Administered 2017-03-28: 10:00:00 via INTRAVENOUS

## 2017-03-28 MED ORDER — SODIUM CHLORIDE 0.9 % IV SOLN: Freq: Once | INTRAVENOUS | Status: AC

## 2017-03-28 NOTE — Discharge Instructions (Signed)

## 2017-03-28 NOTE — Progress Notes (Signed)
Diagnosis: Anemia, unspecified type (D64.9)  Provider: Lorrene Reid, C  Procedure: Patient received 1 unit of PRBCs via piv.  Patienttolerated well.  Post procedure: Patient alert, oriented and ambulatory at discharge.

## 2017-03-29 LAB — BPAM RBC
Blood Product Expiration Date: 201811022359
ISSUE DATE / TIME: 201810110922
Unit Type and Rh: 5100

## 2017-03-29 LAB — TYPE AND SCREEN
ABO/RH(D): AB POS
Antibody Screen: NEGATIVE
Unit division: 0

## 2017-04-22 ENCOUNTER — Other Ambulatory Visit: Payer: Self-pay

## 2017-04-22 ENCOUNTER — Other Ambulatory Visit (HOSPITAL_COMMUNITY): Payer: Self-pay

## 2017-04-22 NOTE — Patient Outreach (Signed)
[place] [facility]) Care Management  04/22/2017  Yechiel D Gainor [birth year] [id number]   Telephone call to patient for monthly call. No answer.  HIPAA compliant voice message left.   Plan: RN CM will attempt patient again in the month of November.   [name], RN, MSN [facility] Care Management Care Management Coordinator Direct Line [phone] Toll Free: [phone]  Fax: [phone]

## 2017-04-23 ENCOUNTER — Inpatient Hospital Stay (HOSPITAL_COMMUNITY)
Admission: RE | Admit: 2017-04-23 | Discharge: 2017-04-23 | Disposition: A | Discharge status: Home / Self Care | Payer: PPO | Source: Ambulatory Visit | Attending: Nephrology | Admitting: Nephrology

## 2017-05-02 ENCOUNTER — Other Ambulatory Visit: Payer: Self-pay

## 2017-05-02 NOTE — Patient Outreach (Signed)
[place] [facility]) Care Management  05/02/2017  Roylee D Arkwright 1956-05-13 [id number]   2nd Telephone call to patient for monthly call.  No answer. HIPAA compliant voice message left.    Plan: RN CM will attempt patient again in the month of November.   [name], RN, MSN [facility] Care Management Care Management Coordinator Direct Line [phone] Toll Free: [phone]  Fax: [phone]

## 2017-05-03 ENCOUNTER — Other Ambulatory Visit: Payer: Self-pay | Admitting: Family Medicine

## 2017-05-03 DIAGNOSIS — M109 Gout, unspecified: Secondary | ICD-10-CM | POA: Diagnosis not present

## 2017-05-03 DIAGNOSIS — D631 Anemia in chronic kidney disease: Secondary | ICD-10-CM | POA: Diagnosis not present

## 2017-05-03 DIAGNOSIS — I129 Hypertensive chronic kidney disease with stage 1 through stage 4 chronic kidney disease, or unspecified chronic kidney disease: Secondary | ICD-10-CM | POA: Diagnosis not present

## 2017-05-03 DIAGNOSIS — N184 Chronic kidney disease, stage 4 (severe): Secondary | ICD-10-CM | POA: Diagnosis not present

## 2017-05-03 DIAGNOSIS — D571 Sickle-cell disease without crisis: Secondary | ICD-10-CM | POA: Diagnosis not present

## 2017-05-03 DIAGNOSIS — I770 Arteriovenous fistula, acquired: Secondary | ICD-10-CM | POA: Diagnosis not present

## 2017-05-03 DIAGNOSIS — Z0000 Encounter for general adult medical examination without abnormal findings: Secondary | ICD-10-CM | POA: Diagnosis not present

## 2017-05-03 DIAGNOSIS — I5020 Unspecified systolic (congestive) heart failure: Secondary | ICD-10-CM | POA: Diagnosis not present

## 2017-05-03 DIAGNOSIS — N2581 Secondary hyperparathyroidism of renal origin: Secondary | ICD-10-CM | POA: Diagnosis not present

## 2017-05-03 DIAGNOSIS — I4891 Unspecified atrial fibrillation: Secondary | ICD-10-CM | POA: Diagnosis not present

## 2017-05-03 DIAGNOSIS — Z720 Tobacco use: Secondary | ICD-10-CM | POA: Diagnosis not present

## 2017-05-06 ENCOUNTER — Other Ambulatory Visit (HOSPITAL_COMMUNITY): Payer: Self-pay

## 2017-05-07 ENCOUNTER — Encounter (HOSPITAL_COMMUNITY)
Admission: RE | Admit: 2017-05-07 | Discharge: 2017-05-07 | Disposition: A | Discharge status: Home / Self Care | Payer: PPO | Source: Ambulatory Visit | Attending: Nephrology | Admitting: Nephrology

## 2017-05-07 VITALS — BP 122/76 | HR 81 | Temp 98.6°F | Resp 18 | Ht 71.0 in | Wt 155.0 lb

## 2017-05-07 DIAGNOSIS — N184 Chronic kidney disease, stage 4 (severe): Secondary | ICD-10-CM | POA: Diagnosis not present

## 2017-05-07 DIAGNOSIS — D638 Anemia in other chronic diseases classified elsewhere: Secondary | ICD-10-CM | POA: Diagnosis not present

## 2017-05-07 LAB — POCT HEMOGLOBIN-HEMACUE: Hemoglobin: 8.5 g/dL — ABNORMAL LOW (ref 13.0–17.0)

## 2017-05-07 MED ORDER — DARBEPOETIN ALFA 200 MCG/0.4ML IJ SOSY
PREFILLED_SYRINGE | INTRAMUSCULAR | Status: AC
  Administered 2017-05-07: 200 ug via SUBCUTANEOUS
  Filled 2017-05-07: qty 0.4

## 2017-05-07 MED ORDER — DARBEPOETIN ALFA 200 MCG/0.4ML IJ SOSY
200.0000 ug | PREFILLED_SYRINGE | INTRAMUSCULAR | Status: DC
Start: 2017-05-07 — End: 2017-05-08
  Administered 2017-05-07: 200 ug via SUBCUTANEOUS

## 2017-05-07 MED ORDER — SODIUM CHLORIDE 0.9 % IV SOLN
510.0000 mg | Freq: Once | INTRAVENOUS | Status: AC
  Administered 2017-05-07: 510 mg via INTRAVENOUS
  Filled 2017-05-07: qty 17

## 2017-05-14 ENCOUNTER — Other Ambulatory Visit: Payer: Self-pay

## 2017-05-14 NOTE — Patient Outreach (Signed)
[place] [facility]) Care Management  [place]  05/14/2017   Patrick [name] [birth year] [id number]  Subjective: Telephone call to patient for monthly call. Patient reports he is doing well. He reports that his weight is steady at 154 lbs.  Patient continues to watch his diet for salt.  Encouraged patient to continue his daily regimen and watch for signs of heart failure.  He verbalized understanding.    Objective:   Encounter Medications:  Outpatient Encounter Medications as of 05/14/2017  Medication Sig Note  . allopurinol (ZYLOPRIM) 100 MG tablet Take 1 tablet (100 mg total) by mouth daily. 09/19/2016: Taking twice a day  . aspirin EC 81 MG tablet Take 81 mg by mouth daily.   . calcitRIOL (ROCALTROL) 0.25 MCG capsule Take 0.25 mcg by mouth daily.    . carvedilol (COREG) 12.5 MG tablet TAKE 1 TABLET TWICE A DAY WITH A MEAL   . folic acid (FOLVITE) 1 MG tablet TAKE 1 TABLET (1 MG TOTAL) BY MOUTH DAILY.   . hydrALAZINE (APRESOLINE) 50 MG tablet Take 50 mg by mouth 2 (two) times daily.   . hydroxyurea (HYDREA) 500 MG capsule TAKE ONE CAPSULE EVERY DAY WITH FOOD TO MINIMIZE GI SIDE EFFECTS   . isosorbide mononitrate (IMDUR) 60 MG 24 hr tablet Take 1 tablet (60 mg total) by mouth daily. NEEDS OFFICE VISIT   . torsemide (DEMADEX) 100 MG tablet TAKE 1 TABLET EVERY DAY MAY TAKE ADDITIONAL TABLET AS NEEDED FOR SHORTNESS OF BREATH   . warfarin (COUMADIN) 5 MG tablet TAKE 1 TABLET BY MOUTH EVERY DAY AT 6PM OR AS DIRECTED BY COUMADIN CLINIC   . allopurinol (ZYLOPRIM) 100 MG tablet TAKE 1 TABLET BY MOUTH EVERY DAY (Patient not taking: Reported on 07/27/5282) 06/20/2438: DUPLICATE  . folic acid (FOLVITE) 1 MG tablet TAKE 1 TABLET (1 MG TOTAL) BY MOUTH DAILY. (Patient not taking: Reported on 03/04/2017)   . isosorbide mononitrate (IMDUR) 60 MG 24 hr tablet Take 1 tablet (60 mg total) by mouth daily. (Patient not taking: Reported on 03/04/2017)   . torsemide (DEMADEX) 100 MG tablet TAKE 1  TABLET EVERY DAY MAY TAKE ADDITIONAL TABLET AS NEEDED FOR SHORTNESS OF BREATH (Patient not taking: Reported on 02/11/2017)    No facility-administered encounter medications on file as of 05/14/2017.     Functional Status:  In your present state of health, do you have any difficulty performing the following activities: 01/29/2017 01/01/2017  Hearing? N N  Vision? N N  Difficulty concentrating or making decisions? N N  Walking or climbing stairs? N N  Dressing or bathing? N N  Doing errands, shopping? - Facilities manager and eating ? - -  Using the Toilet? - -  In the past six months, have you accidently leaked urine? - -  Do you have problems with loss of bowel control? - -  Managing your Medications? - -  Managing your Finances? - -  Housekeeping or managing your Housekeeping? - -  Some recent data might be hidden    Fall/Depression Screening: Fall Risk  05/14/2017 03/27/2017 03/04/2017  Falls in the past year? No No No  Risk for fall due to : - - -  Risk for fall due to: Comment - - -   PHQ 2/9 Scores 05/14/2017 03/27/2017 03/04/2017 02/11/2017 01/04/2017 12/04/2016 11/01/2016  PHQ - 2 Score 0 0 0 0 0 0 0  PHQ- 9 Score - - - - - - -    Assessment: Patient  continues to benefit from care manager outreach for disease management and support.   Plan:  [facility] CM Care Plan Problem Two     Most Recent Value  Care Plan Problem Two  Heart failure Knowledge deficit  Role Documenting the Problem Two  [place] for Problem Two  Active  Interventions for Problem Two Long Term Goal   RN CM discussed heart action plan and when to notify physician.    THN Long Term Goal  Patient will be able to verbalize heart failure zones within 90 days.   THN Long Term Goal Start Date  03/27/17 [goal continued]     RN CM will contact patient in the month of December and patient agrees to next outreach.  [name], RN, MSN [facility] Care Management Care Management Coordinator Direct Line  [phone] Toll Free: [phone]  Fax: [phone]

## 2017-05-17 ENCOUNTER — Ambulatory Visit (INDEPENDENT_AMBULATORY_CARE_PROVIDER_SITE_OTHER): Payer: PPO | Admitting: *Deleted

## 2017-05-17 DIAGNOSIS — Z5181 Encounter for therapeutic drug level monitoring: Secondary | ICD-10-CM

## 2017-05-17 DIAGNOSIS — I4819 Other persistent atrial fibrillation: Secondary | ICD-10-CM

## 2017-05-17 DIAGNOSIS — I481 Persistent atrial fibrillation: Secondary | ICD-10-CM

## 2017-05-17 DIAGNOSIS — I058 Other rheumatic mitral valve diseases: Secondary | ICD-10-CM

## 2017-05-17 DIAGNOSIS — I059 Rheumatic mitral valve disease, unspecified: Secondary | ICD-10-CM | POA: Diagnosis not present

## 2017-05-17 LAB — POCT INR: INR: 2.3

## 2017-05-17 NOTE — Patient Instructions (Signed)
Continue taking 1 tablet daily except 1 and 1/2 tablets on Saturdays.  Recheck INR in 6 weeks. Call with any questions or concerns or any new medications # [phone] Coumadin,  Main # [phone]

## 2017-05-31 ENCOUNTER — Encounter: Payer: Self-pay | Admitting: Family Medicine

## 2017-05-31 ENCOUNTER — Ambulatory Visit (INDEPENDENT_AMBULATORY_CARE_PROVIDER_SITE_OTHER): Payer: PPO | Admitting: Family Medicine

## 2017-05-31 ENCOUNTER — Telehealth: Payer: Self-pay | Admitting: Family Medicine

## 2017-05-31 ENCOUNTER — Other Ambulatory Visit: Payer: Self-pay

## 2017-05-31 VITALS — BP 134/79 | HR 80 | Temp 98.0°F | Resp 16 | Ht 71.0 in | Wt 154.0 lb

## 2017-05-31 DIAGNOSIS — M109 Gout, unspecified: Secondary | ICD-10-CM | POA: Diagnosis not present

## 2017-05-31 DIAGNOSIS — R071 Chest pain on breathing: Secondary | ICD-10-CM | POA: Diagnosis not present

## 2017-05-31 DIAGNOSIS — D5700 Hb-SS disease with crisis, unspecified: Secondary | ICD-10-CM | POA: Diagnosis not present

## 2017-05-31 LAB — D-DIMER, QUANTITATIVE (NOT AT ARMC): D-DIMER: 0.53 mg/L FEU — ABNORMAL HIGH (ref 0.00–0.49)

## 2017-05-31 MED ORDER — FOLIC ACID 1 MG PO TABS: ORAL_TABLET | ORAL | 11 refills | Status: DC

## 2017-05-31 MED ORDER — HYDROXYUREA 500 MG PO CAPS: ORAL_CAPSULE | ORAL | 11 refills | Status: DC

## 2017-05-31 MED ORDER — OXYCODONE-ACETAMINOPHEN 10-325 MG PO TABS: 1.0000 | ORAL_TABLET | ORAL | 0 refills | Status: DC | PRN

## 2017-05-31 NOTE — Patient Outreach (Signed)
[facility]) Care Management  05/31/2017  Patrick [name] 1956-01-17 [id number]   Telephone call to patient for monthly call.  No answer.  HIPAA compliant voice message left.    Plan: RN CM will attempt patient again in the month of December.    [name], RN, MSN [facility] Care Management Care Management Coordinator Direct Line [phone] Toll Free: [phone]  Fax: [phone]

## 2017-05-31 NOTE — Patient Instructions (Addendum)
Will follow up by phone with any abnormal laboratory results Will order a stat CTA to rule out a pulmonary embolism The patient was given clear instructions to go to ER or return to medical center if symptoms do not improve, worsen or new problems develop. The patient verbalized understanding. Will notify patient with laboratory results.  Sickle Cell Anemia, Adult Sickle cell anemia is a condition where your red blood cells are shaped like sickles. Red blood cells carry oxygen through the body. Sickle-shaped red blood cells do not live as long as normal red blood cells. They also clump together and block blood from flowing through the blood vessels. These things prevent the body from getting enough oxygen. Sickle cell anemia causes organ damage and pain. It also increases the risk of infection. Follow these instructions at home:  Drink enough fluid to keep your pee (urine) clear or pale yellow. Drink more in hot weather and during exercise.  Do not smoke. Smoking lowers oxygen levels in the blood.  Only take over-the-counter or prescription medicines as told by your doctor.  Take antibiotic medicines as told by your doctor. Make sure you finish them even if you start to feel better.  Take supplements as told by your doctor.  Consider wearing a medical alert bracelet. This tells anyone caring for you in an emergency of your condition.  When traveling, keep your medical information, doctors' names, and the medicines you take with you at all times.  If you have a fever, do not take fever medicines right away. This could cover up a problem. Tell your doctor.  Keep all follow-up visits with your doctor. Sickle cell anemia requires regular medical care. Contact a doctor if: You have a fever. Get help right away if:  You feel dizzy or faint.  You have new belly (abdominal) pain, especially on the left side near the stomach area.  You have a lasting, often uncomfortable and painful erection  of the penis (priapism). If it is not treated right away, you will become unable to have sex (impotence).  You have numbness in your arms or legs or you have a hard time moving them.  You have a hard time talking.  You have a fever or lasting symptoms for more than 2-3 days.  You have a fever and your symptoms suddenly get worse.  You have signs or symptoms of infection. These include: ? Chills. ? Being more tired than normal (lethargy). ? Irritability. ? Poor eating. ? Throwing up (vomiting).  You have pain that is not helped with medicine.  You have shortness of breath.  You have pain in your chest.  You are coughing up pus-like or bloody mucus.  You have a stiff neck.  Your feet or hands swell or have pain.  Your belly looks bloated.  Your joints hurt. This information is not intended to replace advice given to you by your health care provider. Make sure you discuss any questions you have with your health care provider. Document Released: 03/25/2013 Document Revised: 11/10/2015 Document Reviewed: 01/14/2013 Elsevier Interactive Patient Education  2017 [employer].

## 2017-05-31 NOTE — Progress Notes (Signed)
Subjective:    Patient ID: Patrick [name], male    DOB: Apr 11, 1956, 61 y.o.   MRN: [id number]  HPI Patrick [name], a 61 year old male with a history of sickle cell anemia presents to establish care. Patrick [name] was a patient here several years ago but has been lost to follow up. He continues to follow up with Dr. Lorrene Reid, nephrology for stage 3 kidney disease. Patient has been mostly compliant with Hydrea 017 mg daily and folic acid 1 mg/day.  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  He is followed by Dr. Zadie Rhine ophthalmology, for his vision problems.  Patient is complaining of pain with deep breathing over the past 3 days.  He also endorses upper back and neck pain that are consistent with typical sickle cell crisis.  Patient states that he has been out of pain medications over the past several months.  He is not attempted any over-the-counter interventions to alleviate current symptoms.  Current pain intensity is 6-7/10 described as intermittent and aching.  Patient denies fatigue, shortness of breath, heart palpitations, dysuria, nausea, vomiting, or diarrhea.  Past Medical History:  Diagnosis Date  . Blood dyscrasia   . Blood transfusion    "I've had a bunch of them"  . CHF (congestive heart failure) ([place])    followed by hf clinic  . Chronic kidney disease (CKD), stage III (moderate) (HCC)   . Gout   . BLTJQZES(923.3)    "probably weekly" (01/13/2014)  . Legally blind   . Shortness of breath dyspnea   . Sickle cell disease, type SS ([place])   . Swelling abdomen   . Swelling of extremity, left   . Swelling of extremity, right    Social History   Socioeconomic History  . Marital status: Divorced    Spouse name: Not on file  . Number of children: Not on file  . Years of education: Not on file  . Highest education level: Not on file  Social Needs  . Financial resource strain: Not on file  . Food insecurity -  worry: Not on file  . Food insecurity - inability: Not on file  . Transportation needs - medical: Not on file  . Transportation needs - non-medical: Not on file  Occupational History  . Not on file  Tobacco Use  . Smoking status: Former Smoker    Packs/day: 0.25    Years: 41.00    Pack years: 10.25    Types: Cigarettes    Last attempt to quit: 12/08/2015    Years since quitting: 1.4  . Smokeless tobacco: Never Used  Substance and Sexual Activity  . Alcohol use: Yes    Alcohol/week: 1.8 oz    Types: 1 Glasses of wine, 2 Cans of beer per week    Comment: occasional  . Drug use: No  . Sexual activity: Not Currently  Other Topics Concern  . Not on file  Social History Narrative  . Not on file   Review of Systems  Constitutional: Negative.   HENT: Negative.   Eyes: Negative.        Legally blind  Respiratory: Negative.  Negative for apnea, shortness of breath and wheezing.        Discomfort on inspiration  Gastrointestinal: Negative.   Endocrine: Negative for polydipsia, polyphagia and polyuria.  Genitourinary: Negative.   Musculoskeletal: Positive for arthralgias and myalgias.  Skin: Negative.   Neurological: Negative.  Hematological: Negative.   Psychiatric/Behavioral: Negative.        Objective:   Physical Exam  Constitutional: He is oriented to person, place, and time. He appears well-developed and well-nourished.  HENT:  Head: Normocephalic and atraumatic.  Right Ear: External ear normal.  Left Ear: External ear normal.  Nose: Nose normal.  Mouth/Throat: Oropharynx is clear and moist.  Eyes: Conjunctivae are normal. Pupils are equal, round, and reactive to light.  Neck: Normal range of motion. Neck supple.  Cardiovascular: Normal rate, regular rhythm, normal heart sounds and intact distal pulses.  Pulmonary/Chest: Effort normal and breath sounds normal.  Abdominal: Soft. Bowel sounds are normal.  Musculoskeletal:       Cervical back: He exhibits decreased  range of motion and pain.  Neurological: He is alert and oriented to person, place, and time. He has normal reflexes.  Skin: Skin is warm and dry.  Psychiatric: He has a normal mood and affect. His behavior is normal. Judgment and thought content normal.   BP 134/79 (BP Location: Right Arm, Patient Position: Sitting, Cuff Size: Normal)   Pulse 80   Temp 98 F (36.7 C) (Oral)   Resp 16   Ht 5\' 11"  (1.803 m)   Wt 154 lb (69.9 kg)   SpO2 100%   BMI 21.48 kg/m     Assessment & Plan:  Sickle cell crisis (HCC) Current pain intensity is 7/10, recommend the patient transition to day hospital for further workup and evaluation.  Patient desires to return home and manage with his medication regimen and hydration.  Continue Hydrea 500 mg daily. We discussed the need for good hydration, monitoring of hydration status, avoidance of heat, cold, stress, and infection triggers. We discussed the risks and benefits of Hydrea, including bone marrow suppression, the possibility of GI upset, skin ulcers, hair thinning, and teratogenicity. The patient was reminded of the need to seek medical attention of any symptoms of bleeding, anemia, or infection. Continue folic acid 1 mg daily to prevent aplastic bone marrow crises.   Pulmonary evaluation - Patient denies severe recurrent wheezes, shortness of breath with exercise, or persistent cough. If these symptoms develop, pulmonary function tests with spirometry will be ordered, and if abnormal, plan on referral to Pulmonology for further evaluation.   Cardiac -patient is followed by cardiology, he is to follow-up as scheduled.  Eye -legally blind, patient is to schedule follow-up with ophthalmology   Immunization status -up-to-date with immunizations  Acute and chronic painful episodes - We agreed on Percocet 10-325 mg every 6 hours as needed for moderate to severe pain.  We discussed that pt is to receive his schedule II prescriptions only from [place]. Pt is also  aware that the prescription history is available to [place] online through the PMP Aware Controlled substance agreement signed 05/31/2017.  We reminded Patrick [name] that all patients receiving Schedule II narcotics must be seen for follow within one month of prescription being requested. We reviewed the terms of our pain agreement, including the need to keep medicines in a safe locked location away from children or pets, and the need to report excess sedation or constipation, measures to avoid constipation, and policies related to early refills and stolen prescriptions. According to the [place] Chronic Pain Initiative program, we have reviewed details related to analgesia, adverse effects, aberrant behaviors.  - oxyCODONE-acetaminophen (PERCOCET) 10-325 MG tablet; Take 1 tablet by mouth every 4 (four) hours as needed for pain.  Dispense: 60 tablet; Refill: 0 - folic acid (FOLVITE)  1 MG tablet; TAKE 1 TABLET (1 MG TOTAL) BY MOUTH DAILY.  Dispense: 30 tablet; Refill: 11 - hydroxyurea (HYDREA) 500 MG capsule; TAKE ONE CAPSULE EVERY DAY WITH FOOD TO MINIMIZE GI SIDE EFFECTS  Dispense: 30 capsule; Refill: 11 - CBC with Differential - CMP and Liver - Urinalysis, Routine w reflex microscopic   Chest pain on respiration Patient is complaining of discomfort with deep breathing.  Recommend a stat CT angiogram of chest to rule out pulmonary embolism.  Suggested that patient follow-up in the emergency department for further workup and evaluation due to insurance constraints.  Patient states that he does not want to wait in the emergency department, he feels that he is well enough to go home and manage.  He states that if symptoms worsen he will follow-up in the emergency department. - CT ANGIO CHEST PE W OR WO CONTRAST; Future - D-dimer, quantitative (not at [facility])  Arthritis, gouty Continue allopurinol 100 mg daily as previously prescribed - Uric Acid   RTC: One month for medication management and sickle cell  anemia   Patrick Pounds  MSN, FNP-C Patient [place] [address] [place], [place] [address] [phone]

## 2017-05-31 NOTE — Telephone Encounter (Signed)
Patrick [name], a 61 year old male with a history of sickle cell anemia presented this am complaining of chest pain with inspiration. Stat d-dimer was sent, reviewed results, elevated at 0.53. Called patient and left message. Patient to report to the emergency department for further work up and evaluation. The patient has been called 4 times. Will continue to attempt to contact patient.   [name]  MSN, FNP-C Patient [facility] [address] [place], [place] [address] [phone]

## 2017-06-01 LAB — CMP AND LIVER
ALT: 13 IU/L (ref 0–44)
AST: 23 IU/L (ref 0–40)
Albumin: 4.7 g/dL (ref 3.5–5.5)
Alkaline Phosphatase: 54 IU/L (ref 39–117)
BUN: 79 mg/dL (ref 6–24)
Bilirubin Total: 1.1 mg/dL (ref 0.0–1.2)
Bilirubin, Direct: 0.26 mg/dL (ref 0.00–0.40)
CO2: 20 mmol/L (ref 20–29)
Calcium: 9.1 mg/dL (ref 8.7–10.2)
Chloride: 95 mmol/L — ABNORMAL LOW (ref 96–106)
Creatinine, Ser: 5.04 mg/dL — ABNORMAL HIGH (ref 0.76–1.27)
GFR calc Af Amer: 13 mL/min/{1.73_m2} — ABNORMAL LOW (ref >59)
GFR calc non Af Amer: 12 mL/min/{1.73_m2} — ABNORMAL LOW (ref >59)
Glucose: 89 mg/dL (ref 65–99)
Potassium: 4 mmol/L (ref 3.5–5.2)
Sodium: 135 mmol/L (ref 134–144)
Total Protein: 7.6 g/dL (ref 6.0–8.5)

## 2017-06-01 LAB — MICROSCOPIC EXAMINATION
Bacteria, UA: NONE SEEN
Casts: NONE SEEN /lpf

## 2017-06-01 LAB — CBC WITH DIFFERENTIAL/PLATELET
Basophils Absolute: 0.1 10*3/uL (ref 0.0–0.2)
Basos: 2 %
EOS (ABSOLUTE): 0.2 10*3/uL (ref 0.0–0.4)
Eos: 3 %
Hematocrit: 27.2 % — ABNORMAL LOW (ref 37.5–51.0)
Hemoglobin: 9.1 g/dL — ABNORMAL LOW (ref 13.0–17.7)
Immature Grans (Abs): 0.1 10*3/uL (ref 0.0–0.1)
Immature Granulocytes: 1 %
Lymphocytes Absolute: 1 10*3/uL (ref 0.7–3.1)
Lymphs: 15 %
MCH: 34.7 pg — ABNORMAL HIGH (ref 26.6–33.0)
MCHC: 33.5 g/dL (ref 31.5–35.7)
MCV: 104 fL — ABNORMAL HIGH (ref 79–97)
Monocytes Absolute: 1.1 10*3/uL — ABNORMAL HIGH (ref 0.1–0.9)
Monocytes: 17 %
NRBC: 2 % — ABNORMAL HIGH (ref 0–0)
Neutrophils Absolute: 4.3 10*3/uL (ref 1.4–7.0)
Neutrophils: 62 %
Platelets: 267 10*3/uL (ref 150–379)
RBC: 2.62 x10E6/uL — CL (ref 4.14–5.80)
RDW: 23.1 % — ABNORMAL HIGH (ref 12.3–15.4)
WBC: 6.8 10*3/uL (ref 3.4–10.8)

## 2017-06-01 LAB — URINALYSIS, ROUTINE W REFLEX MICROSCOPIC
Bilirubin, UA: NEGATIVE
Glucose, UA: NEGATIVE
Ketones, UA: NEGATIVE
Leukocytes, UA: NEGATIVE
Nitrite, UA: NEGATIVE
RBC, UA: NEGATIVE
Specific Gravity, UA: 1.012 (ref 1.005–1.030)
Urobilinogen, Ur: 0.2 mg/dL (ref 0.2–1.0)
pH, UA: 5 (ref 5.0–7.5)

## 2017-06-01 LAB — URIC ACID: Uric Acid: 5.8 mg/dL (ref 3.7–8.6)

## 2017-06-04 ENCOUNTER — Encounter (HOSPITAL_COMMUNITY)
Admission: RE | Admit: 2017-06-04 | Discharge: 2017-06-04 | Disposition: A | Discharge status: Home / Self Care | Payer: PPO | Source: Ambulatory Visit | Attending: Nephrology | Admitting: Nephrology

## 2017-06-04 ENCOUNTER — Other Ambulatory Visit: Payer: Self-pay | Admitting: Family Medicine

## 2017-06-04 ENCOUNTER — Telehealth: Payer: Self-pay

## 2017-06-04 VITALS — BP 134/73 | HR 81 | Resp 18

## 2017-06-04 DIAGNOSIS — R7989 Other specified abnormal findings of blood chemistry: Secondary | ICD-10-CM

## 2017-06-04 DIAGNOSIS — D638 Anemia in other chronic diseases classified elsewhere: Secondary | ICD-10-CM | POA: Diagnosis not present

## 2017-06-04 DIAGNOSIS — N184 Chronic kidney disease, stage 4 (severe): Secondary | ICD-10-CM | POA: Diagnosis not present

## 2017-06-04 LAB — IRON AND TIBC
Iron: 86 ug/dL (ref 45–182)
Saturation Ratios: 26 % (ref 17.9–39.5)
TIBC: 326 ug/dL (ref 250–450)
UIBC: 240 ug/dL

## 2017-06-04 LAB — FERRITIN: Ferritin: 114 ng/mL (ref 24–336)

## 2017-06-04 LAB — POCT HEMOGLOBIN-HEMACUE: Hemoglobin: 9.1 g/dL — ABNORMAL LOW (ref 13.0–17.0)

## 2017-06-04 MED ORDER — DARBEPOETIN ALFA 200 MCG/0.4ML IJ SOSY
200.0000 ug | PREFILLED_SYRINGE | INTRAMUSCULAR | Status: DC
  Administered 2017-06-04: 200 ug via SUBCUTANEOUS

## 2017-06-04 MED ORDER — DARBEPOETIN ALFA 200 MCG/0.4ML IJ SOSY
PREFILLED_SYRINGE | INTRAMUSCULAR | Status: AC
  Filled 2017-06-04: qty 0.4

## 2017-06-04 NOTE — Telephone Encounter (Signed)
Patient showed up at [facility] for a Stat CT, which was not scheduled and was sent home.   I called and had this scheduled for patient, however patient states he can not come back until Thursday. This has been scheduled for Thursday 06/06/2017.

## 2017-06-04 NOTE — Progress Notes (Signed)
CT angiogram order placed on behalf of provider Cammie Sickle, FNP-C

## 2017-06-05 ENCOUNTER — Telehealth: Payer: Self-pay

## 2017-06-05 ENCOUNTER — Ambulatory Visit (HOSPITAL_COMMUNITY): Payer: PPO

## 2017-06-05 NOTE — Telephone Encounter (Signed)
Called, no answer on either number.

## 2017-06-05 NOTE — Telephone Encounter (Signed)
Called, no answer on either number. Left a voicemail on home number for patient to call back. Need to cancel CT which is scheduled for tomorrow 06/06/2017 due to low kidney function.

## 2017-06-06 ENCOUNTER — Ambulatory Visit (HOSPITAL_COMMUNITY): Admission: RE | Admit: 2017-06-06 | Payer: PPO | Source: Ambulatory Visit

## 2017-06-06 ENCOUNTER — Other Ambulatory Visit: Payer: Self-pay | Admitting: Family Medicine

## 2017-06-06 ENCOUNTER — Telehealth: Payer: Self-pay | Admitting: Family Medicine

## 2017-06-06 ENCOUNTER — Other Ambulatory Visit (HOSPITAL_COMMUNITY): Payer: Self-pay | Admitting: Internal Medicine

## 2017-06-06 DIAGNOSIS — R071 Chest pain on breathing: Secondary | ICD-10-CM

## 2017-06-06 DIAGNOSIS — R7989 Other specified abnormal findings of blood chemistry: Secondary | ICD-10-CM

## 2017-06-06 DIAGNOSIS — I5033 Acute on chronic diastolic (congestive) heart failure: Secondary | ICD-10-CM

## 2017-06-06 NOTE — Telephone Encounter (Signed)
Patrick [name], a 61 year old male with a history of sickle cell anemia, hemoglobin SS was evaluated in the office on 05/31/2017.  Patient was complaining of pain with inspiration.  Ordered a d-dimer, which was positive. A stat CTA was ordered for further evaluation. CTA cancelled due to Stage V CKD. Will continue to monitor closely.   Have attempted to contact patient on all numbers. Have been unable to contact.    [name]  MSN, FNP-C [name] [facility] [address] [place], [place] [address] [phone]

## 2017-06-06 NOTE — Telephone Encounter (Signed)
Called, no answer.

## 2017-06-07 ENCOUNTER — Other Ambulatory Visit: Payer: Self-pay

## 2017-06-07 NOTE — Patient Outreach (Addendum)
[place] [facility]) Care Management  [place]  06/07/2017   Patrick [name] 20-Nov-1955 [id number]  Subjective: Telephone call to patient for monthly call.  Patient reports he is doing ok.  He states that weight is the same at 154 lbs. Discussed with patient importance of continuing to weigh and watch his salt intake.  Discussed with patient heart failure symptoms and when to notify physician.  He verbalized understanding.    Objective:   Encounter Medications:  Outpatient Encounter Medications as of 06/07/2017  Medication Sig Note  . allopurinol (ZYLOPRIM) 100 MG tablet Take 1 tablet (100 mg total) by mouth daily. 09/19/2016: Taking twice a day  . aspirin EC 81 MG tablet Take 81 mg by mouth daily.   . calcitRIOL (ROCALTROL) 0.25 MCG capsule Take 0.25 mcg by mouth daily.    . carvedilol (COREG) 12.5 MG tablet TAKE 1 TABLET TWICE A DAY WITH A MEAL   . folic acid (FOLVITE) 1 MG tablet TAKE 1 TABLET (1 MG TOTAL) BY MOUTH DAILY.   . hydrALAZINE (APRESOLINE) 50 MG tablet Take 50 mg by mouth 2 (two) times daily.   . hydroxyurea (HYDREA) 500 MG capsule TAKE ONE CAPSULE EVERY DAY WITH FOOD TO MINIMIZE GI SIDE EFFECTS   . isosorbide mononitrate (IMDUR) 60 MG 24 hr tablet Take 1 tablet (60 mg total) by mouth daily.   Marland Kitchen oxyCODONE-acetaminophen (PERCOCET) 10-325 MG tablet Take 1 tablet by mouth every 4 (four) hours as needed for pain.   Marland Kitchen torsemide (DEMADEX) 100 MG tablet TAKE 1 TABLET EVERY DAY MAY TAKE ADDITIONAL TABLET AS NEEDED FOR SHORTNESS OF BREATH   . warfarin (COUMADIN) 5 MG tablet TAKE 1 TABLET BY MOUTH EVERY DAY AT 6PM OR AS DIRECTED BY COUMADIN CLINIC   . allopurinol (ZYLOPRIM) 100 MG tablet TAKE 1 TABLET BY MOUTH EVERY DAY (Patient not taking: Reported on 10/24/5636) 12/21/6431: DUPLICATE  . torsemide (DEMADEX) 100 MG tablet TAKE 1 TABLET EVERY DAY MAY TAKE ADDITIONAL TABLET AS NEEDED FOR SHORTNESS OF BREATH (Patient not taking: Reported on 06/07/2017)    No  facility-administered encounter medications on file as of 06/07/2017.     Functional Status:  In your present state of health, do you have any difficulty performing the following activities: 01/29/2017 01/01/2017  Hearing? N N  Vision? N N  Difficulty concentrating or making decisions? N N  Walking or climbing stairs? N N  Dressing or bathing? N N  Doing errands, shopping? - Facilities manager and eating ? - -  Using the Toilet? - -  In the past six months, have you accidently leaked urine? - -  Do you have problems with loss of bowel control? - -  Managing your Medications? - -  Managing your Finances? - -  Housekeeping or managing your Housekeeping? - -  Some recent data might be hidden    Fall/Depression Screening: Fall Risk  06/07/2017 05/31/2017 05/14/2017  Falls in the past year? No No No  Risk for fall due to : - - -  Risk for fall due to: Comment - - -   PHQ 2/9 Scores 06/07/2017 05/31/2017 05/14/2017 03/27/2017 03/04/2017 02/11/2017 01/04/2017  PHQ - 2 Score 0 0 0 0 0 0 0  PHQ- 9 Score - - - - - - -    Assessment: Patient continues to benefit from care manager outreach for disease management and support.   Plan:  [facility] CM Care Plan Problem Two     Most Recent Value  Care Plan Problem Two  Heart failure Knowledge deficit  Role Documenting the Problem Two  [place] for Problem Two  Active  Interventions for Problem Two Long Term Goal   RN CM discussed with patient signs of heart failure and when to notify physician.    THN Long Term Goal  Patient will be able to verbalize heart failure zones within 90 days.   THN Long Term Goal Start Date  03/27/17 [goal continued]     RN CM will contact patient in the month of January and patient agrees to next outreach.  [name], RN, MSN [facility] Care Management Care Management Coordinator Direct Line [phone] Toll Free: [phone]  Fax: [phone]

## 2017-06-28 ENCOUNTER — Other Ambulatory Visit: Payer: Self-pay

## 2017-06-28 NOTE — Patient Outreach (Signed)
[place] [facility]) Care Management  06/28/2017  Coda D Agresti [birth year] [id number]   Telephone call to patient for monthly outreach. No answer.  HIPAA compliant voice message left.    Plan: RN CM will attempt patient again within the month of January.  [name], RN, MSN [facility] Care Management Care Management Coordinator Direct Line [phone] Toll Free: [phone]  Fax: [phone]

## 2017-07-01 ENCOUNTER — Ambulatory Visit (INDEPENDENT_AMBULATORY_CARE_PROVIDER_SITE_OTHER): Payer: PPO | Admitting: *Deleted

## 2017-07-01 DIAGNOSIS — I481 Persistent atrial fibrillation: Secondary | ICD-10-CM | POA: Diagnosis not present

## 2017-07-01 DIAGNOSIS — I4819 Other persistent atrial fibrillation: Secondary | ICD-10-CM

## 2017-07-01 DIAGNOSIS — I059 Rheumatic mitral valve disease, unspecified: Secondary | ICD-10-CM

## 2017-07-01 DIAGNOSIS — I058 Other rheumatic mitral valve diseases: Secondary | ICD-10-CM

## 2017-07-01 DIAGNOSIS — Z5181 Encounter for therapeutic drug level monitoring: Secondary | ICD-10-CM | POA: Diagnosis not present

## 2017-07-01 LAB — POCT INR: INR: 2.1

## 2017-07-01 NOTE — Patient Instructions (Signed)
Description   Continue taking 1 tablet daily except 1 and 1/2 tablets on Saturdays.  Recheck INR in 6 weeks. Call with any questions or concerns or any new medications # [phone] Coumadin, Main # [phone]

## 2017-07-02 ENCOUNTER — Encounter (HOSPITAL_COMMUNITY)
Admission: RE | Admit: 2017-07-02 | Discharge: 2017-07-02 | Disposition: A | Discharge status: Home / Self Care | Payer: PPO | Source: Ambulatory Visit | Attending: Nephrology | Admitting: Nephrology

## 2017-07-02 VITALS — BP 127/65 | HR 82 | Temp 97.7°F | Resp 18

## 2017-07-02 DIAGNOSIS — D638 Anemia in other chronic diseases classified elsewhere: Secondary | ICD-10-CM | POA: Insufficient documentation

## 2017-07-02 DIAGNOSIS — N184 Chronic kidney disease, stage 4 (severe): Secondary | ICD-10-CM | POA: Diagnosis not present

## 2017-07-02 LAB — IRON AND TIBC
Iron: 82 ug/dL (ref 45–182)
Saturation Ratios: 26 % (ref 17.9–39.5)
TIBC: 318 ug/dL (ref 250–450)
UIBC: 236 ug/dL

## 2017-07-02 LAB — FERRITIN: Ferritin: 63 ng/mL (ref 24–336)

## 2017-07-02 LAB — POCT HEMOGLOBIN-HEMACUE: Hemoglobin: 8.8 g/dL — ABNORMAL LOW (ref 13.0–17.0)

## 2017-07-02 MED ORDER — DARBEPOETIN ALFA 200 MCG/0.4ML IJ SOSY
PREFILLED_SYRINGE | INTRAMUSCULAR | Status: AC
  Filled 2017-07-02: qty 0.4

## 2017-07-02 MED ORDER — DARBEPOETIN ALFA 200 MCG/0.4ML IJ SOSY
200.0000 ug | PREFILLED_SYRINGE | INTRAMUSCULAR | Status: DC
  Administered 2017-07-02: 200 ug via SUBCUTANEOUS

## 2017-07-04 ENCOUNTER — Other Ambulatory Visit: Payer: Self-pay

## 2017-07-04 DIAGNOSIS — M109 Gout, unspecified: Secondary | ICD-10-CM | POA: Diagnosis not present

## 2017-07-04 DIAGNOSIS — D631 Anemia in chronic kidney disease: Secondary | ICD-10-CM | POA: Diagnosis not present

## 2017-07-04 DIAGNOSIS — D571 Sickle-cell disease without crisis: Secondary | ICD-10-CM | POA: Diagnosis not present

## 2017-07-04 DIAGNOSIS — Z720 Tobacco use: Secondary | ICD-10-CM | POA: Diagnosis not present

## 2017-07-04 DIAGNOSIS — N185 Chronic kidney disease, stage 5: Secondary | ICD-10-CM | POA: Diagnosis not present

## 2017-07-04 DIAGNOSIS — I4891 Unspecified atrial fibrillation: Secondary | ICD-10-CM | POA: Diagnosis not present

## 2017-07-04 DIAGNOSIS — I770 Arteriovenous fistula, acquired: Secondary | ICD-10-CM | POA: Diagnosis not present

## 2017-07-04 DIAGNOSIS — I120 Hypertensive chronic kidney disease with stage 5 chronic kidney disease or end stage renal disease: Secondary | ICD-10-CM | POA: Diagnosis not present

## 2017-07-04 DIAGNOSIS — N184 Chronic kidney disease, stage 4 (severe): Secondary | ICD-10-CM | POA: Diagnosis not present

## 2017-07-04 DIAGNOSIS — I5020 Unspecified systolic (congestive) heart failure: Secondary | ICD-10-CM | POA: Diagnosis not present

## 2017-07-04 DIAGNOSIS — N2581 Secondary hyperparathyroidism of renal origin: Secondary | ICD-10-CM | POA: Diagnosis not present

## 2017-07-04 NOTE — Patient Outreach (Signed)
[place] [facility]) Care Management  07/04/2017  [name] 04-25-1956 [id number]   2nd telephone call to patient for monthly call. No answer. HIPAA compliant voice message left.  Plan: RN CM will attempt patient again in the month of January.  [name], RN, MSN [facility] Care Management Care Management Coordinator Direct Line [phone] Toll Free: [phone]  Fax: [phone]

## 2017-07-05 ENCOUNTER — Ambulatory Visit: Payer: Self-pay

## 2017-07-10 ENCOUNTER — Other Ambulatory Visit: Payer: Self-pay

## 2017-07-10 NOTE — Patient Outreach (Signed)
[place] [facility]) Care Management  [place]  07/10/2017   JANDEL PATRIARCA [birth year] [id number]  Subjective: Telephone call to patient for monthly call.  Patient reports he is doing good. Patient reports weight about the same.  Patient denies shortness of breath or swelling. Reiterated with patient signs of heart failure and notifying physician. He verbalized understanding.  Discussed with patient case closure next month as he continues to meet goals of care.  He verbalized understanding.  Objective:   Encounter Medications:  Outpatient Encounter Medications as of 07/10/2017  Medication Sig Note  . allopurinol (ZYLOPRIM) 100 MG tablet Take 1 tablet (100 mg total) by mouth daily. 09/19/2016: Taking twice a day  . aspirin EC 81 MG tablet Take 81 mg by mouth daily.   . calcitRIOL (ROCALTROL) 0.25 MCG capsule Take 0.25 mcg by mouth daily.    . carvedilol (COREG) 12.5 MG tablet TAKE 1 TABLET TWICE A DAY WITH A MEAL   . folic acid (FOLVITE) 1 MG tablet TAKE 1 TABLET (1 MG TOTAL) BY MOUTH DAILY.   . hydrALAZINE (APRESOLINE) 50 MG tablet Take 50 mg by mouth 2 (two) times daily.   . hydroxyurea (HYDREA) 500 MG capsule TAKE ONE CAPSULE EVERY DAY WITH FOOD TO MINIMIZE GI SIDE EFFECTS   . isosorbide mononitrate (IMDUR) 60 MG 24 hr tablet Take 1 tablet (60 mg total) by mouth daily.   Marland Kitchen oxyCODONE-acetaminophen (PERCOCET) 10-325 MG tablet Take 1 tablet by mouth every 4 (four) hours as needed for pain.   Marland Kitchen torsemide (DEMADEX) 100 MG tablet TAKE 1 TABLET EVERY DAY MAY TAKE ADDITIONAL TABLET AS NEEDED FOR SHORTNESS OF BREATH   . warfarin (COUMADIN) 5 MG tablet TAKE 1 TABLET BY MOUTH EVERY DAY AT 6PM OR AS DIRECTED BY COUMADIN CLINIC   . allopurinol (ZYLOPRIM) 100 MG tablet TAKE 1 TABLET BY MOUTH EVERY DAY (Patient not taking: Reported on 06/25/5629) 09/24/7024: DUPLICATE  . torsemide (DEMADEX) 100 MG tablet TAKE 1 TABLET EVERY DAY MAY TAKE ADDITIONAL TABLET AS NEEDED FOR SHORTNESS OF  BREATH (Patient not taking: Reported on 07/10/2017)    No facility-administered encounter medications on file as of 07/10/2017.     Functional Status:  In your present state of health, do you have any difficulty performing the following activities: 01/29/2017 01/01/2017  Hearing? N N  Vision? N N  Difficulty concentrating or making decisions? N N  Walking or climbing stairs? N N  Dressing or bathing? N N  Doing errands, shopping? - Facilities manager and eating ? - -  Using the Toilet? - -  In the past six months, have you accidently leaked urine? - -  Do you have problems with loss of bowel control? - -  Managing your Medications? - -  Managing your Finances? - -  Housekeeping or managing your Housekeeping? - -  Some recent data might be hidden    Fall/Depression Screening: Fall Risk  06/07/2017 05/31/2017 05/14/2017  Falls in the past year? No No No  Risk for fall due to : - - -  Risk for fall due to: Comment - - -   PHQ 2/9 Scores 06/07/2017 05/31/2017 05/14/2017 03/27/2017 03/04/2017 02/11/2017 01/04/2017  PHQ - 2 Score 0 0 0 0 0 0 0  PHQ- 9 Score - - - - - - -    Assessment: Patient continues to benefit from care manager outreach for disease management and support.    Plan:  [facility] CM Care Plan Problem Two  Most Recent Value  Care Plan Problem Two  Heart failure Knowledge deficit  Role Documenting the Problem Two  [place] for Problem Two  Active  Interventions for Problem Two Long Term Goal   Patient will continue to weigh and monitor for swelling, fatgue, and shortness of breath.  RN CM reiterated signs fo heart failure and notifying physician.   THN Long Term Goal  Patient will be able to verbalize heart failure zones within 90 days.   THN Long Term Goal Start Date  07/10/17 [goal continued]     RN CM will contact patient in the month of February and patient agrees to next outreach.  [name], RN, MSN [facility] Care Management Care Management  Coordinator Direct Line [phone] Toll Free: [phone]  Fax: [phone]

## 2017-07-16 ENCOUNTER — Ambulatory Visit (INDEPENDENT_AMBULATORY_CARE_PROVIDER_SITE_OTHER): Payer: PPO | Admitting: Internal Medicine

## 2017-07-16 ENCOUNTER — Encounter: Payer: Self-pay | Admitting: Internal Medicine

## 2017-07-16 VITALS — BP 134/76 | HR 78 | Temp 98.1°F | Resp 14 | Ht 71.0 in | Wt 148.0 lb

## 2017-07-16 DIAGNOSIS — D5700 Hb-SS disease with crisis, unspecified: Secondary | ICD-10-CM

## 2017-07-16 DIAGNOSIS — I5082 Biventricular heart failure: Secondary | ICD-10-CM | POA: Diagnosis not present

## 2017-07-16 DIAGNOSIS — Z720 Tobacco use: Secondary | ICD-10-CM

## 2017-07-16 DIAGNOSIS — Z1211 Encounter for screening for malignant neoplasm of colon: Secondary | ICD-10-CM | POA: Insufficient documentation

## 2017-07-16 NOTE — Patient Instructions (Signed)
Sickle Cell Anemia, Adult °Sickle cell anemia is a condition in which red blood cells have an abnormal “sickle” shape. This abnormal shape shortens the cells’ life span, which results in a lower than normal concentration of red blood cells in the blood. The sickle shape also causes the cells to clump together and block free blood flow through the blood vessels. As a result, the tissues and organs of the body do not receive enough oxygen. Sickle cell anemia causes organ damage and pain and increases the risk of infection. °What are the causes? °Sickle cell anemia is a genetic disorder. Those who receive two copies of the gene have the condition, and those who receive one copy have the trait. °What increases the risk? °The sickle cell gene is most common in people whose families originated in [place]. Other areas of the globe where sickle cell trait occurs include the Mediterranean, South and [place], the [place], and the [place]. °What are the signs or symptoms? °· Pain, especially in the extremities, back, chest, or abdomen (common). The pain may start suddenly or may develop following an illness, especially if there is dehydration. Pain can also occur due to overexertion or exposure to extreme temperature changes. °· Frequent severe bacterial infections, especially certain types of pneumonia and meningitis. °· Pain and swelling in the hands and feet. °· Decreased activity. °· Loss of appetite. °· Change in behavior. °· Headaches. °· Seizures. °· Shortness of breath or difficulty breathing. °· Vision changes. °· Skin ulcers. °Those with the trait may not have symptoms or they may have mild symptoms. °How is this diagnosed? °Sickle cell anemia is diagnosed with blood tests that demonstrate the genetic trait. It is often diagnosed during the newborn period, due to mandatory testing nationwide. A variety of blood tests, X-rays, CT scans, MRI scans, ultrasounds, and lung function tests may also be done to  monitor the condition. °How is this treated? °Sickle cell anemia may be treated with: °· Medicines. You may be given pain medicines, antibiotic medicines (to treat and prevent infections) or medicines to increase the production of certain types of hemoglobin. °· Fluids. °· Oxygen. °· Blood transfusions. ° °Follow these instructions at home: °· Drink enough fluid to keep your urine clear or pale yellow. Increase your fluid intake in hot weather and during exercise. °· Do not smoke. Smoking lowers oxygen levels in the blood. °· Only take over-the-counter or prescription medicines for pain, fever, or discomfort as directed by your health care provider. °· Take antibiotics as directed by your health care provider. Make sure you finish them it even if you start to feel better. °· Take supplements as directed by your health care provider. °· Consider wearing a medical alert bracelet. This tells anyone caring for you in an emergency of your condition. °· When traveling, keep your medical information, health care provider's names, and the medicines you take with you at all times. °· If you develop a fever, do not take medicines to reduce the fever right away. This could cover up a problem that is developing. Notify your health care provider. °· Keep all follow-up appointments with your health care provider. Sickle cell anemia requires regular medical care. °Contact a health care provider if: °You have a fever. °Get help right away if: °· You feel dizzy or faint. °· You have new abdominal pain, especially on the left side near the stomach area. °· You develop a persistent, often uncomfortable and painful penile erection (priapism). If this is not   treated immediately it will lead to impotence. °· You have numbness your arms or legs or you have a hard time moving them. °· You have a hard time with speech. °· You have a fever or persistent symptoms for more than 2-3 days. °· You have a fever and your symptoms suddenly get  worse. °· You have signs or symptoms of infection. These include: °? Chills. °? Abnormal tiredness (lethargy). °? Irritability. °? Poor eating. °? Vomiting. °· You develop pain that is not helped with medicine. °· You develop shortness of breath. °· You have pain in your chest. °· You are coughing up pus-like or bloody sputum. °· You develop a stiff neck. °· Your feet or hands swell or have pain. °· Your abdomen appears bloated. °· You develop joint pain. °This information is not intended to replace advice given to you by your health care provider. Make sure you discuss any questions you have with your health care provider. °Document Released: 09/12/2005 Document Revised: 12/23/2015 Document Reviewed: 01/14/2013 °Elsevier Interactive Patient Education © 2017 Elsevier Inc. ° °

## 2017-07-16 NOTE — Progress Notes (Signed)
Patrick [name], is a 62 y.o. male  PPJ:[id number]  PYK:[id number]  DOB - [birth year]  Chief Complaint  Patient presents with  . Sickle Cell Anemia      Subjective:   Patrick [name] is a 62 y.o. male with a history of sickle cell anemia and biventricular CHF who presents here today for a routine follow up visit. Patient has no significant complaint today. He continues to follow up with Dr. 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  He is followed by Dr. Zadie Rhine ophthalmology, for his vision problems. Patient has No headache, No chest pain, No abdominal pain - No Nausea, No new weakness tingling or numbness, No Cough - SOB. Patient denies fatigue, shortness of breath, heart palpitations, dysuria, nausea, vomiting, or diarrhea./  Problem  Colon Cancer Screening  Sickle cell crisis ([place])   ALLERGIES: No Known Allergies  PAST MEDICAL HISTORY: Past Medical History:  Diagnosis Date  . Blood dyscrasia   . Blood transfusion    "I've had a bunch of them"  . CHF (congestive heart failure) ([place])    followed by hf clinic  . Chronic kidney disease (CKD), stage III (moderate) (HCC)   . Gout   . NLZJQBHA(193.7)    "probably weekly" (01/13/2014)  . Legally blind   . Shortness of breath dyspnea   . Sickle cell disease, type SS ([place])   . Swelling abdomen   . Swelling of extremity, left   . Swelling of extremity, right     MEDICATIONS AT HOME: Prior to Admission medications   Medication Sig Start Date End Date Taking? Authorizing Provider  allopurinol (ZYLOPRIM) 100 MG tablet Take 1 tablet (100 mg total) by mouth daily. 06/14/16  Yes [name], MD  aspirin EC 81 MG tablet Take 81 mg by mouth daily.   Yes [provider]  calcitRIOL (ROCALTROL) 0.25 MCG capsule Take 0.25 mcg by mouth daily.    Yes [provider]  carvedilol (COREG) 12.5 MG tablet  TAKE 1 TABLET TWICE A DAY WITH A MEAL 08/14/16  Yes Bensimhon, Shaune Pascal, MD  folic acid (FOLVITE) 1 MG tablet TAKE 1 TABLET (1 MG TOTAL) BY MOUTH DAILY. 05/31/17  Yes Dorena Dew, FNP  hydrALAZINE (APRESOLINE) 50 MG tablet Take 50 mg by mouth 2 (two) times daily.   Yes [provider]  hydroxyurea (HYDREA) 500 MG capsule TAKE ONE CAPSULE EVERY DAY WITH FOOD TO MINIMIZE GI SIDE EFFECTS 05/31/17  Yes Dorena Dew, FNP  isosorbide mononitrate (IMDUR) 60 MG 24 hr tablet Take 1 tablet (60 mg total) by mouth daily. 07/11/16  Yes Bensimhon, Shaune Pascal, MD  oxyCODONE-acetaminophen (PERCOCET) 10-325 MG tablet Take 1 tablet by mouth every 4 (four) hours as needed for pain. 05/31/17  Yes Dorena Dew, FNP  torsemide (DEMADEX) 100 MG tablet TAKE 1 TABLET EVERY DAY MAY TAKE ADDITIONAL TABLET AS NEEDED FOR SHORTNESS OF BREATH 01/14/17  Yes Bensimhon, Shaune Pascal, MD  warfarin (COUMADIN) 5 MG tablet TAKE 1 TABLET BY MOUTH EVERY DAY AT 6PM OR AS DIRECTED BY COUMADIN CLINIC 03/05/17  Yes Bensimhon, Shaune Pascal, MD  torsemide (DEMADEX) 100 MG tablet TAKE 1 TABLET EVERY DAY MAY TAKE ADDITIONAL TABLET AS NEEDED FOR SHORTNESS OF BREATH Patient not taking: Reported on 07/10/2017 12/24/16   Bensimhon, Shaune Pascal, MD    Objective:   Vitals:   07/16/17 0900  BP: 134/76  Pulse: 78  Resp: 14  Temp: 98.1 F (36.7 C)  TempSrc: Oral  SpO2: 100%  Weight: 148 lb (67.1 kg)  Height: 5\' 11"  (1.803 m)   Exam General appearance : Awake, alert, not in any distress. Speech Clear. Not toxic looking HEENT: Atraumatic and Normocephalic, pupils equally reactive to light and accomodation Neck: Supple, no JVD. No cervical lymphadenopathy.  Chest: Good air entry bilaterally, no added sounds  CVS: S1 S2 regular, occasional ectopic beats, no murmurs.  Abdomen: Bowel sounds present, Non tender and not distended with no gaurding, rigidity or rebound. Extremities: B/L Lower Ext shows no edema, both legs are warm to  touch Neurology: Awake alert, and oriented X 3, CN II-XII intact, Non focal Skin: No Rash  Data Review Lab Results  Component Value Date   HGBA1C <4.0 06/07/2011    Assessment & Plan   1. Sickle cell disease - Continue Hydrea. We discussed the need for good hydration, monitoring of hydration status, avoidance of heat, cold, stress, and infection triggers. We discussed the risks and benefits of Hydrea, including bone marrow suppression, the possibility of GI upset, skin ulcers, hair thinning, and teratogenicity. The patient was reminded of the need to seek medical attention for any symptoms of bleeding, anemia, or infection. Continue folic acid 1 mg daily to prevent aplastic bone marrow crises.   Pulmonary evaluation - Patient denies severe recurrent wheezes, shortness of breath with exercise, or persistent cough. If these symptoms develop, pulmonary function tests with spirometry will be ordered, and if abnormal, plan on referral to Pulmonology for further evaluation.  Eye - Patient is legally blind from sickle cell retinopathy, follow up with Ophthalmology  Immunization status - Yearly influenza vaccination is recommended, as well as being up to date with Meningococcal and Pneumococcal vaccines.   Acute and chronic painful episodes - We agreed on Opiate dose and amount of pills  per month. We discussed that pt is to receive Schedule II prescriptions only from our clinic. Pt is also aware that the prescription history is available to [place] online through the [facility] CSRS. Controlled substance agreement reviewed and signed. We reminded [name] that all patients receiving Schedule II narcotics must be seen for follow within one month of prescription being requested. We reviewed the terms of our pain agreement, including the need to keep medicines in a safe locked location away from children or pets, and the need to report excess sedation or constipation, measures to avoid constipation, and policies  related to early refills and stolen prescriptions. According to the [place] Chronic [facility] Initiative program, we have reviewed details related to analgesia, adverse effects and aberrant behaviors.  2. Tobacco abuse [place] was counseled on the dangers of tobacco use, and was advised to quit. Reviewed strategies to maximize success, including removing cigarettes and smoking materials from environment, stress management and support of family/friends.  3. Colon cancer screening  - Ambulatory referral to Gastroenterology for Colonoscopy  4. Biventricular congestive heart failure ([place])  - EKG done today and reviewed by me showed Normal Sinus Rhythm, with 1st degree AV block. LVH. Similar to previous EKG - ECHOCARDIOGRAM COMPLETE; Future  Patient have been counseled extensively about nutrition and exercise. Other issues discussed during this visit include: low cholesterol diet, weight control and daily exercise, annual eye examinations at Ophthalmology, importance of adherence with medications and regular follow-up.  Return in about 3 months (around 10/14/2017) for Sickle Cell Disease/Pain.  The patient was given clear instructions to go to ER or return  to medical center if symptoms don't improve, worsen or new problems develop. The patient verbalized understanding. The patient was told to call to get lab results if they haven't heard anything in the next week.   This note has been created with Surveyor, quantity. Any transcriptional errors are unintentional.    Angelica Chessman, MD, MHA, Karilyn Cota, [place] and [place], [place]   07/16/2017, 9:32 AM

## 2017-07-17 ENCOUNTER — Other Ambulatory Visit: Payer: Self-pay | Admitting: Internal Medicine

## 2017-07-19 ENCOUNTER — Ambulatory Visit (HOSPITAL_COMMUNITY)
Admission: RE | Admit: 2017-07-19 | Discharge: 2017-07-19 | Disposition: A | Discharge status: Home / Self Care | Payer: PPO | Source: Ambulatory Visit | Attending: Internal Medicine | Admitting: Internal Medicine

## 2017-07-19 DIAGNOSIS — I509 Heart failure, unspecified: Secondary | ICD-10-CM | POA: Insufficient documentation

## 2017-07-19 DIAGNOSIS — N189 Chronic kidney disease, unspecified: Secondary | ICD-10-CM | POA: Insufficient documentation

## 2017-07-19 DIAGNOSIS — Z85038 Personal history of other malignant neoplasm of large intestine: Secondary | ICD-10-CM | POA: Diagnosis not present

## 2017-07-19 DIAGNOSIS — I5082 Biventricular heart failure: Secondary | ICD-10-CM | POA: Diagnosis not present

## 2017-07-19 DIAGNOSIS — I340 Nonrheumatic mitral (valve) insufficiency: Secondary | ICD-10-CM | POA: Diagnosis not present

## 2017-07-19 DIAGNOSIS — D571 Sickle-cell disease without crisis: Secondary | ICD-10-CM | POA: Insufficient documentation

## 2017-07-19 DIAGNOSIS — Z720 Tobacco use: Secondary | ICD-10-CM | POA: Insufficient documentation

## 2017-07-19 NOTE — Progress Notes (Signed)
Echocardiogram 2D Echocardiogram has been performed.  Patrick [name] 07/19/2017, 9:57 AM

## 2017-07-22 ENCOUNTER — Telehealth: Payer: Self-pay

## 2017-07-22 NOTE — Telephone Encounter (Signed)
-----   Message from [name], MD sent at 07/19/2017  4:36 PM EST ----- Please inform patient that his heart ultrasound result showed stable heart function. No change in therapy at this time.

## 2017-07-22 NOTE — Telephone Encounter (Signed)
Called and spoke with patient, advised that echo was stable and no changes were needed at this time. Asked that patient keep next scheduled appointment. Thanks!

## 2017-07-30 ENCOUNTER — Encounter (HOSPITAL_COMMUNITY)
Admission: RE | Admit: 2017-07-30 | Discharge: 2017-07-30 | Disposition: A | Discharge status: Home / Self Care | Payer: PPO | Source: Ambulatory Visit | Attending: Nephrology | Admitting: Nephrology

## 2017-07-30 VITALS — BP 124/68 | HR 91 | Temp 97.7°F | Resp 18

## 2017-07-30 DIAGNOSIS — N184 Chronic kidney disease, stage 4 (severe): Secondary | ICD-10-CM | POA: Diagnosis not present

## 2017-07-30 DIAGNOSIS — D638 Anemia in other chronic diseases classified elsewhere: Secondary | ICD-10-CM | POA: Insufficient documentation

## 2017-07-30 LAB — IRON AND TIBC
Iron: 98 ug/dL (ref 45–182)
Saturation Ratios: 34 % (ref 17.9–39.5)
TIBC: 287 ug/dL (ref 250–450)
UIBC: 189 ug/dL

## 2017-07-30 LAB — FERRITIN: Ferritin: 62 ng/mL (ref 24–336)

## 2017-07-30 LAB — POCT HEMOGLOBIN-HEMACUE: Hemoglobin: 8.3 g/dL — ABNORMAL LOW (ref 13.0–17.0)

## 2017-07-30 MED ORDER — DARBEPOETIN ALFA 200 MCG/0.4ML IJ SOSY
PREFILLED_SYRINGE | INTRAMUSCULAR | Status: AC
  Filled 2017-07-30: qty 0.4

## 2017-07-30 MED ORDER — DARBEPOETIN ALFA 200 MCG/0.4ML IJ SOSY
200.0000 ug | PREFILLED_SYRINGE | INTRAMUSCULAR | Status: DC
  Administered 2017-07-30: 200 ug via SUBCUTANEOUS

## 2017-07-31 ENCOUNTER — Other Ambulatory Visit: Payer: Self-pay

## 2017-07-31 NOTE — Patient Outreach (Signed)
[facility] [facility]) Care Management  07/31/2017  Patrick [name] [birth year] [id number]   Telephone call to patient for case closure. No answer. Unable to leave a voice message.  Plan: RN CM will contact patient in one business day.  [name], RN, MSN [facility] Care Management Care Management Coordinator Direct Line [phone] Toll Free: [phone]  Fax: [phone]

## 2017-08-01 ENCOUNTER — Other Ambulatory Visit: Payer: Self-pay

## 2017-08-01 DIAGNOSIS — I5033 Acute on chronic diastolic (congestive) heart failure: Secondary | ICD-10-CM

## 2017-08-01 MED ORDER — TORSEMIDE 100 MG PO TABS: ORAL_TABLET | ORAL | 3 refills | Status: DC

## 2017-08-01 NOTE — Patient Outreach (Signed)
[facility]) Care Management  [place]  08/01/2017   Patrick [name] 13-May-1956 [id number]  Subjective: Telephone call to patient for closure call. Patient reports that he is doing ok but he is out of his torsemide and hydroxyurea for about a week. Patient reports that he is waiting for pharmacy to get a response from the physician.  Advised that RN CM will call to inquire and get back with him. He verbalized understanding.  Patient denies any shortness of breath, swelling, fatigue, or weight gain.  Patient aware of case closure and offers no additional questions at this time.    [facility] [facility] [address].  No refills for torsemide and hydroxyurea is ready at [facility] location. Telephone call to patient to inform him of this and advised that I would call physician office about Torsemide and get back with him.  He verbalized understanding and will get the hydroxyurea.  Telephone call to Dr. Doreene Burke nurse [place]. CM contact information given for return call.     Update: 2:15 pm Spoke with Mickel Baas at Dr. Christa See office about request for refills on Torsemide. She will be putting in refill now.  CM called patient to inform of [facility] [address].  Patient states he will pick it up on tomorrow.  Patient appreciative of assistance.    Objective:   Encounter Medications:  Outpatient Encounter Medications as of 08/01/2017  Medication Sig Note  . allopurinol (ZYLOPRIM) 100 MG tablet Take 1 tablet (100 mg total) by mouth daily. 09/19/2016: Taking twice a day  . aspirin EC 81 MG tablet Take 81 mg by mouth daily.   . calcitRIOL (ROCALTROL) 0.25 MCG capsule Take 0.25 mcg by mouth daily.    . carvedilol (COREG) 12.5 MG tablet TAKE 1 TABLET TWICE A DAY WITH A MEAL   . folic acid (FOLVITE) 1 MG tablet TAKE 1 TABLET (1 MG TOTAL) BY MOUTH DAILY.   . hydrALAZINE (APRESOLINE) 50 MG tablet Take 50 mg by mouth 2 (two) times  daily.   . hydroxyurea (HYDREA) 500 MG capsule TAKE ONE CAPSULE EVERY DAY WITH FOOD TO MINIMIZE GI SIDE EFFECTS   . isosorbide mononitrate (IMDUR) 60 MG 24 hr tablet Take 1 tablet (60 mg total) by mouth daily.   Marland Kitchen oxyCODONE-acetaminophen (PERCOCET) 10-325 MG tablet Take 1 tablet by mouth every 4 (four) hours as needed for pain.   Marland Kitchen torsemide (DEMADEX) 100 MG tablet TAKE 1 TABLET EVERY DAY MAY TAKE ADDITIONAL TABLET AS NEEDED FOR SHORTNESS OF BREATH   . warfarin (COUMADIN) 5 MG tablet TAKE 1 TABLET BY MOUTH EVERY DAY AT 6PM OR AS DIRECTED BY COUMADIN CLINIC   . torsemide (DEMADEX) 100 MG tablet TAKE 1 TABLET EVERY DAY MAY TAKE ADDITIONAL TABLET AS NEEDED FOR SHORTNESS OF BREATH 01/26/9146: Duplicate   No facility-administered encounter medications on file as of 08/01/2017.     Functional Status:  In your present state of health, do you have any difficulty performing the following activities: 01/29/2017 01/01/2017  Hearing? N N  Vision? N N  Difficulty concentrating or making decisions? N N  Walking or climbing stairs? N N  Dressing or bathing? N N  Doing errands, shopping? - Facilities manager and eating ? - -  Using the Toilet? - -  In the past six months, have you accidently leaked urine? - -  Do you have problems with loss of bowel control? - -  Managing  your Medications? - -  Managing your Finances? - -  Housekeeping or managing your Housekeeping? - -  Some recent data might be hidden    Fall/Depression Screening: Fall Risk  07/16/2017 07/10/2017 06/07/2017  Falls in the past year? No No No  Risk for fall due to : - - -  Risk for fall due to: Comment - - -   PHQ 2/9 Scores 07/16/2017 07/10/2017 06/07/2017 05/31/2017 05/14/2017 03/27/2017 03/04/2017  PHQ - 2 Score 0 0 0 0 0 0 0  PHQ- 9 Score - - - - - - -    Assessment: Patient needing refills for medication.  Patient managing well with heart failure and meeting goals.    Plan:  [facility] CM Care Plan Problem Two     Most Recent Value   Care Plan Problem Two  Heart failure Knowledge deficit  Role Documenting the Problem [place] for Problem Two  Active  Interventions for Problem Two Long Term Goal   Patient aware of signs of heart failure.   [facility] Long Term Goal  Patient will be able to verbalize heart failure zones within 90 days.   [facility] Long Term Goal Start Date  07/10/17 [goal continued]  [facility] Long Term Goal Met Date  08/01/17     RN CM will wait return call from physician office and get back with patient before closing case.   RN CM will contact patient next week to insure he has his medication and officially close case at that time.   [name], RN, MSN [facility] Care Management Care Management Coordinator Direct Line [phone] Toll Free: [phone]  Fax: [phone]

## 2017-08-05 ENCOUNTER — Other Ambulatory Visit: Payer: Self-pay

## 2017-08-05 NOTE — Patient Outreach (Signed)
[facility]) Care Management  08/05/2017  Kay D Stelzer 22-Oct-1955 [id number]   Telephone call to patient to ensure he had all his medication and he did not have any other needs.  Patient reports he is doing good and he has no other needs at this time.  Patient reports that he got all his medications.  Asked patient about the transfer of his medications.  Patient reports that the asked the pharmacist when he went pick up his medications and he was assured by the pharmacist that his medications would be transferred for his next refills. Patient denies any other problems or concerns and is thankful for all the help provided.    Plan: RN CM will close case and notify physician of case close RN CM will notify care management assist of case status.   [name], RN, MSN [facility] Care Management Care Management Coordinator Direct Line [phone] Toll Free: [phone]  Fax: [phone]

## 2017-08-12 ENCOUNTER — Ambulatory Visit (INDEPENDENT_AMBULATORY_CARE_PROVIDER_SITE_OTHER): Payer: PPO | Admitting: *Deleted

## 2017-08-12 DIAGNOSIS — I059 Rheumatic mitral valve disease, unspecified: Secondary | ICD-10-CM

## 2017-08-12 DIAGNOSIS — Z5181 Encounter for therapeutic drug level monitoring: Secondary | ICD-10-CM | POA: Diagnosis not present

## 2017-08-12 DIAGNOSIS — I481 Persistent atrial fibrillation: Secondary | ICD-10-CM | POA: Diagnosis not present

## 2017-08-12 DIAGNOSIS — I4819 Other persistent atrial fibrillation: Secondary | ICD-10-CM

## 2017-08-12 DIAGNOSIS — I058 Other rheumatic mitral valve diseases: Secondary | ICD-10-CM

## 2017-08-12 LAB — POCT INR: INR: 2.4

## 2017-08-12 NOTE — Patient Instructions (Signed)
Description   Continue taking 1 tablet daily except 1 and 1/2 tablets on Saturdays.  Recheck INR in 6 weeks. Call with any questions or concerns or any new medications # [phone] Coumadin, Main # [phone]

## 2017-08-18 ENCOUNTER — Other Ambulatory Visit: Payer: Self-pay | Admitting: Cardiology

## 2017-08-19 ENCOUNTER — Telehealth (HOSPITAL_COMMUNITY): Payer: Self-pay | Admitting: Internal Medicine

## 2017-08-19 NOTE — Telephone Encounter (Signed)
Called and left VM for patient to call back.  Per staff message from Georgeanna Lea, RN, pt needs fu appt with Dr. Haroldine Laws in order for refills to be called in.

## 2017-08-20 ENCOUNTER — Other Ambulatory Visit: Payer: Self-pay | Admitting: *Deleted

## 2017-08-21 ENCOUNTER — Other Ambulatory Visit (HOSPITAL_COMMUNITY): Payer: Self-pay | Admitting: *Deleted

## 2017-08-21 MED ORDER — CARVEDILOL 12.5 MG PO TABS
12.5000 mg | ORAL_TABLET | Freq: Two times a day (BID) | ORAL | 0 refills | Status: DC
Start: 2017-08-21 — End: 2017-08-21

## 2017-08-21 MED ORDER — CARVEDILOL 12.5 MG PO TABS: 12.5000 mg | ORAL_TABLET | Freq: Two times a day (BID) | ORAL | 1 refills | Status: DC

## 2017-08-27 ENCOUNTER — Encounter (HOSPITAL_COMMUNITY)
Admission: RE | Admit: 2017-08-27 | Discharge: 2017-08-27 | Disposition: A | Discharge status: Home / Self Care | Payer: PPO | Source: Ambulatory Visit | Attending: Nephrology | Admitting: Nephrology

## 2017-08-27 VITALS — BP 125/71 | HR 87 | Temp 97.6°F

## 2017-08-27 DIAGNOSIS — D638 Anemia in other chronic diseases classified elsewhere: Secondary | ICD-10-CM | POA: Diagnosis not present

## 2017-08-27 DIAGNOSIS — N184 Chronic kidney disease, stage 4 (severe): Secondary | ICD-10-CM

## 2017-08-27 LAB — IRON AND TIBC
Iron: 119 ug/dL (ref 45–182)
Saturation Ratios: 38 % (ref 17.9–39.5)
TIBC: 314 ug/dL (ref 250–450)
UIBC: 195 ug/dL

## 2017-08-27 LAB — FERRITIN: Ferritin: 22 ng/mL — ABNORMAL LOW (ref 24–336)

## 2017-08-27 LAB — POCT HEMOGLOBIN-HEMACUE: Hemoglobin: 8 g/dL — ABNORMAL LOW (ref 13.0–17.0)

## 2017-08-27 MED ORDER — DARBEPOETIN ALFA 200 MCG/0.4ML IJ SOSY
200.0000 ug | PREFILLED_SYRINGE | INTRAMUSCULAR | Status: DC
  Administered 2017-08-27: 200 ug via SUBCUTANEOUS

## 2017-08-27 MED ORDER — DARBEPOETIN ALFA 200 MCG/0.4ML IJ SOSY
PREFILLED_SYRINGE | INTRAMUSCULAR | Status: AC
  Filled 2017-08-27: qty 0.4

## 2017-09-03 DIAGNOSIS — I4891 Unspecified atrial fibrillation: Secondary | ICD-10-CM | POA: Diagnosis not present

## 2017-09-03 DIAGNOSIS — N185 Chronic kidney disease, stage 5: Secondary | ICD-10-CM | POA: Diagnosis not present

## 2017-09-03 DIAGNOSIS — M109 Gout, unspecified: Secondary | ICD-10-CM | POA: Diagnosis not present

## 2017-09-03 DIAGNOSIS — D631 Anemia in chronic kidney disease: Secondary | ICD-10-CM | POA: Diagnosis not present

## 2017-09-03 DIAGNOSIS — D571 Sickle-cell disease without crisis: Secondary | ICD-10-CM | POA: Diagnosis not present

## 2017-09-03 DIAGNOSIS — I120 Hypertensive chronic kidney disease with stage 5 chronic kidney disease or end stage renal disease: Secondary | ICD-10-CM | POA: Diagnosis not present

## 2017-09-03 DIAGNOSIS — I770 Arteriovenous fistula, acquired: Secondary | ICD-10-CM | POA: Diagnosis not present

## 2017-09-03 DIAGNOSIS — Z720 Tobacco use: Secondary | ICD-10-CM | POA: Diagnosis not present

## 2017-09-03 DIAGNOSIS — I5020 Unspecified systolic (congestive) heart failure: Secondary | ICD-10-CM | POA: Diagnosis not present

## 2017-09-03 DIAGNOSIS — N2581 Secondary hyperparathyroidism of renal origin: Secondary | ICD-10-CM | POA: Diagnosis not present

## 2017-09-04 ENCOUNTER — Other Ambulatory Visit: Payer: Self-pay | Admitting: Cardiology

## 2017-09-18 ENCOUNTER — Inpatient Hospital Stay (HOSPITAL_COMMUNITY)
Admission: EM | Admit: 2017-09-18 | Discharge: 2017-09-24 | DRG: 378 | Disposition: A | Discharge status: Home / Self Care | Payer: PPO | Attending: Family Medicine | Admitting: Family Medicine

## 2017-09-18 ENCOUNTER — Emergency Department (HOSPITAL_COMMUNITY): Payer: PPO

## 2017-09-18 ENCOUNTER — Encounter (HOSPITAL_COMMUNITY): Payer: Self-pay

## 2017-09-18 DIAGNOSIS — I132 Hypertensive heart and chronic kidney disease with heart failure and with stage 5 chronic kidney disease, or end stage renal disease: Secondary | ICD-10-CM | POA: Diagnosis present

## 2017-09-18 DIAGNOSIS — Z87891 Personal history of nicotine dependence: Secondary | ICD-10-CM | POA: Diagnosis not present

## 2017-09-18 DIAGNOSIS — K648 Other hemorrhoids: Secondary | ICD-10-CM | POA: Diagnosis present

## 2017-09-18 DIAGNOSIS — D500 Iron deficiency anemia secondary to blood loss (chronic): Secondary | ICD-10-CM | POA: Diagnosis not present

## 2017-09-18 DIAGNOSIS — K922 Gastrointestinal hemorrhage, unspecified: Secondary | ICD-10-CM | POA: Diagnosis present

## 2017-09-18 DIAGNOSIS — N185 Chronic kidney disease, stage 5: Secondary | ICD-10-CM | POA: Diagnosis present

## 2017-09-18 DIAGNOSIS — I428 Other cardiomyopathies: Secondary | ICD-10-CM | POA: Diagnosis present

## 2017-09-18 DIAGNOSIS — K920 Hematemesis: Secondary | ICD-10-CM | POA: Diagnosis present

## 2017-09-18 DIAGNOSIS — R109 Unspecified abdominal pain: Secondary | ICD-10-CM | POA: Diagnosis not present

## 2017-09-18 DIAGNOSIS — H3530 Unspecified macular degeneration: Secondary | ICD-10-CM | POA: Diagnosis present

## 2017-09-18 DIAGNOSIS — I5032 Chronic diastolic (congestive) heart failure: Secondary | ICD-10-CM | POA: Diagnosis not present

## 2017-09-18 DIAGNOSIS — I5033 Acute on chronic diastolic (congestive) heart failure: Secondary | ICD-10-CM | POA: Diagnosis not present

## 2017-09-18 DIAGNOSIS — K8020 Calculus of gallbladder without cholecystitis without obstruction: Secondary | ICD-10-CM | POA: Diagnosis present

## 2017-09-18 DIAGNOSIS — A084 Viral intestinal infection, unspecified: Secondary | ICD-10-CM | POA: Diagnosis present

## 2017-09-18 DIAGNOSIS — E876 Hypokalemia: Secondary | ICD-10-CM | POA: Diagnosis present

## 2017-09-18 DIAGNOSIS — D62 Acute posthemorrhagic anemia: Secondary | ICD-10-CM | POA: Diagnosis present

## 2017-09-18 DIAGNOSIS — D571 Sickle-cell disease without crisis: Secondary | ICD-10-CM | POA: Diagnosis present

## 2017-09-18 DIAGNOSIS — I482 Chronic atrial fibrillation: Secondary | ICD-10-CM | POA: Diagnosis not present

## 2017-09-18 DIAGNOSIS — K2960 Other gastritis without bleeding: Secondary | ICD-10-CM | POA: Diagnosis present

## 2017-09-18 DIAGNOSIS — D696 Thrombocytopenia, unspecified: Secondary | ICD-10-CM | POA: Diagnosis present

## 2017-09-18 DIAGNOSIS — K219 Gastro-esophageal reflux disease without esophagitis: Secondary | ICD-10-CM | POA: Diagnosis not present

## 2017-09-18 DIAGNOSIS — D5700 Hb-SS disease with crisis, unspecified: Secondary | ICD-10-CM | POA: Diagnosis not present

## 2017-09-18 DIAGNOSIS — I5022 Chronic systolic (congestive) heart failure: Secondary | ICD-10-CM | POA: Diagnosis present

## 2017-09-18 DIAGNOSIS — I4891 Unspecified atrial fibrillation: Secondary | ICD-10-CM | POA: Diagnosis present

## 2017-09-18 DIAGNOSIS — K828 Other specified diseases of gallbladder: Secondary | ICD-10-CM | POA: Diagnosis present

## 2017-09-18 DIAGNOSIS — D509 Iron deficiency anemia, unspecified: Secondary | ICD-10-CM | POA: Diagnosis not present

## 2017-09-18 DIAGNOSIS — M109 Gout, unspecified: Secondary | ICD-10-CM | POA: Diagnosis present

## 2017-09-18 DIAGNOSIS — Z79899 Other long term (current) drug therapy: Secondary | ICD-10-CM | POA: Diagnosis not present

## 2017-09-18 DIAGNOSIS — Z7901 Long term (current) use of anticoagulants: Secondary | ICD-10-CM | POA: Diagnosis not present

## 2017-09-18 DIAGNOSIS — D649 Anemia, unspecified: Secondary | ICD-10-CM | POA: Diagnosis not present

## 2017-09-18 DIAGNOSIS — R1084 Generalized abdominal pain: Secondary | ICD-10-CM | POA: Diagnosis not present

## 2017-09-18 DIAGNOSIS — Z7982 Long term (current) use of aspirin: Secondary | ICD-10-CM

## 2017-09-18 DIAGNOSIS — D638 Anemia in other chronic diseases classified elsewhere: Secondary | ICD-10-CM | POA: Diagnosis present

## 2017-09-18 DIAGNOSIS — H548 Legal blindness, as defined in USA: Secondary | ICD-10-CM | POA: Diagnosis present

## 2017-09-18 DIAGNOSIS — R531 Weakness: Secondary | ICD-10-CM | POA: Diagnosis not present

## 2017-09-18 DIAGNOSIS — R1011 Right upper quadrant pain: Secondary | ICD-10-CM | POA: Diagnosis not present

## 2017-09-18 DIAGNOSIS — R1013 Epigastric pain: Secondary | ICD-10-CM | POA: Diagnosis not present

## 2017-09-18 DIAGNOSIS — A045 Campylobacter enteritis: Secondary | ICD-10-CM | POA: Diagnosis not present

## 2017-09-18 LAB — BASIC METABOLIC PANEL
Anion gap: 14 (ref 5–15)
BUN: 153 mg/dL — ABNORMAL HIGH (ref 6–20)
CO2: 19 mmol/L — ABNORMAL LOW (ref 22–32)
Calcium: 9.2 mg/dL (ref 8.9–10.3)
Chloride: 102 mmol/L (ref 101–111)
Creatinine, Ser: 4.83 mg/dL — ABNORMAL HIGH (ref 0.61–1.24)
GFR calc Af Amer: 14 mL/min — ABNORMAL LOW (ref >60)
GFR calc non Af Amer: 12 mL/min — ABNORMAL LOW (ref >60)
Glucose, Bld: 128 mg/dL — ABNORMAL HIGH (ref 65–99)
Potassium: 3.4 mmol/L — ABNORMAL LOW (ref 3.5–5.1)
Sodium: 135 mmol/L (ref 135–145)

## 2017-09-18 LAB — URINALYSIS, ROUTINE W REFLEX MICROSCOPIC
Bacteria, UA: NONE SEEN
Bilirubin Urine: NEGATIVE
Glucose, UA: NEGATIVE mg/dL
Ketones, ur: NEGATIVE mg/dL
Leukocytes, UA: NEGATIVE
Nitrite: NEGATIVE
Protein, ur: 100 mg/dL — AB
Specific Gravity, Urine: 1.01 (ref 1.005–1.030)
Squamous Epithelial / LPF: NONE SEEN
pH: 5 (ref 5.0–8.0)

## 2017-09-18 LAB — HEPATIC FUNCTION PANEL
ALT: 25 U/L (ref 17–63)
AST: 29 U/L (ref 15–41)
Albumin: 3.7 g/dL (ref 3.5–5.0)
Alkaline Phosphatase: 36 U/L — ABNORMAL LOW (ref 38–126)
Bilirubin, Direct: 0.2 mg/dL (ref 0.1–0.5)
Indirect Bilirubin: 1.1 mg/dL — ABNORMAL HIGH (ref 0.3–0.9)
Total Bilirubin: 1.3 mg/dL — ABNORMAL HIGH (ref 0.3–1.2)
Total Protein: 6.9 g/dL (ref 6.5–8.1)

## 2017-09-18 LAB — CBC
HCT: 13.1 % — ABNORMAL LOW (ref 39.0–52.0)
Hemoglobin: 4.6 g/dL — CL (ref 13.0–17.0)
MCH: 33.6 pg (ref 26.0–34.0)
MCHC: 35.1 g/dL (ref 30.0–36.0)
MCV: 95.6 fL (ref 78.0–100.0)
Platelets: 127 10*3/uL — ABNORMAL LOW (ref 150–400)
RBC: 1.37 MIL/uL — ABNORMAL LOW (ref 4.22–5.81)
RDW: 22.7 % — ABNORMAL HIGH (ref 11.5–15.5)
WBC: 12.2 10*3/uL — ABNORMAL HIGH (ref 4.0–10.5)

## 2017-09-18 LAB — PREPARE RBC (CROSSMATCH)

## 2017-09-18 LAB — CBG MONITORING, ED: Glucose-Capillary: 118 mg/dL — ABNORMAL HIGH (ref 65–99)

## 2017-09-18 LAB — PROTIME-INR
INR: 3.86
Prothrombin Time: 37.6 seconds — ABNORMAL HIGH (ref 11.4–15.2)

## 2017-09-18 LAB — RETICULOCYTES
RBC.: 1.3 MIL/uL — ABNORMAL LOW (ref 4.22–5.81)
Retic Count, Absolute: 75.4 10*3/uL (ref 19.0–186.0)
Retic Ct Pct: 5.8 % — ABNORMAL HIGH (ref 0.4–3.1)

## 2017-09-18 MED ORDER — SODIUM CHLORIDE 0.9 % IV SOLN
Freq: Once | INTRAVENOUS | Status: AC
  Administered 2017-09-18: 22:00:00 via INTRAVENOUS

## 2017-09-18 MED ORDER — PANTOPRAZOLE SODIUM 40 MG IV SOLR
40.0000 mg | Freq: Once | INTRAVENOUS | Status: AC
  Administered 2017-09-18: 40 mg via INTRAVENOUS
  Filled 2017-09-18: qty 40

## 2017-09-18 MED ORDER — SODIUM CHLORIDE 0.9 % IV SOLN: 10.0000 mL/h | Freq: Once | INTRAVENOUS | Status: DC

## 2017-09-18 MED ORDER — PROTHROMBIN COMPLEX CONC HUMAN 500 UNITS IV KIT
1571.0000 [IU] | PACK | Status: AC
  Administered 2017-09-19: 1571 [IU] via INTRAVENOUS
  Filled 2017-09-18: qty 571

## 2017-09-18 MED ORDER — PANTOPRAZOLE SODIUM 40 MG IV SOLR
40.0000 mg | Freq: Once | INTRAVENOUS | Status: AC
  Administered 2017-09-19: 40 mg via INTRAVENOUS
  Filled 2017-09-18: qty 40

## 2017-09-18 MED ORDER — FUROSEMIDE 10 MG/ML IJ SOLN
40.0000 mg | Freq: Every day | INTRAMUSCULAR | Status: DC
  Administered 2017-09-19 (×2): 40 mg via INTRAVENOUS
  Filled 2017-09-18 (×2): qty 4

## 2017-09-18 MED ORDER — VITAMIN K1 10 MG/ML IJ SOLN
10.0000 mg | INTRAVENOUS | Status: AC
  Administered 2017-09-19: 10 mg via INTRAVENOUS
  Filled 2017-09-18: qty 1

## 2017-09-18 MED ORDER — SODIUM CHLORIDE 0.9 % IV SOLN
10.0000 mL/h | Freq: Once | INTRAVENOUS | Status: AC
Start: 2017-09-18 — End: 2017-09-19
  Administered 2017-09-18: 10 mL/h via INTRAVENOUS

## 2017-09-18 MED ORDER — FUROSEMIDE 10 MG/ML IJ SOLN: 20.0000 mg | Freq: Every day | INTRAMUSCULAR | Status: DC

## 2017-09-18 NOTE — ED Triage Notes (Signed)
Pt reports that last night he had a headache and then  vomited dark red blood x 1, diarrhea today, upper abd pain and feels weak. Tachycardic and hypotensive in triage, initial BP 82/66, recheck 91/63

## 2017-09-18 NOTE — ED Notes (Signed)
CBG 118.  Tray RN notified

## 2017-09-18 NOTE — H&P (Addendum)
Patrick [name] Admission History and Physical Service Pager: [phone]  Patient name: Patrick [name] Medical record number: [id number] Date of birth: 28-Aug-1955 Age: 62 y.o. Gender: male  Primary Care Provider: [name], MD Consultants: Gastroenterology Code Status: Full (confirmed on admission)  Chief Complaint: Abdominal pain with Hematemasis  Assessment and Plan: Butler D Coston is a 62 y.o. male presenting with 1 day of hematemesis. PMH is significant for sickle cell anemia, HFmrEF, CKD IV, visually impaired, Atrial fibrillation, Gout  Abdominal pain with hematemesis leading to profound anemia Epigastric abdominal pain with Hematemesis and possible melena.  Hemoglobin 4.6 with transfusion of PRBC x3.  Supratherapeutic INR to 3.8 from any coagulation with warfarin. Reversal with K Centra and vitamin K. AIM65 score 1. Mild leukocytosis of 12 with diarrhea, emesis and sick contact, make viral gastroenteritis likely cause of GI symptoms.  Stress from vomiting with supratherapeutic INR likely source of upper GI bleeding. Since patient has no history of GERD or NSAID use, PUD is unlikely.  Patient has no history of alcohol use and is hemodynamically stable so variceal bleeding is not suspected.  Serum lipase is pending, so cannot rule out pancreatitis. -Admit to stepdown, attending Dr. Ardelia Mems -GI consulted in ED, awaiting endoscopy on 4/4 -Follow posttransfusion H&H, serial CBCs every 4 hours after that -Warfarin reversal per pharmacy, trending INR -Holding warfarin -IV Protonix -Abdominal ultrasound and lipase pending -Telemetry and continuous pulse ox -FOBT -Troponin x1   Atrial fibrillation with RVR EKG on admission showed atrial fibrillation with RVR, rate 117.  Prior EKG shows first-degree AV block on 08/27/2017 and possible LVH. Anticoagulated on warfarin, supratherapeutic INR 3.8 on admission.  A. fib with RVR likely due to profound  anemia.  Chads vas 2 score of 2.  Rate control with Coreg 12.5 mg twice daily -Hold warfarin in the setting of bleeding, following INR as above -Continue Coreg -AM EKG once patient has received transfusions to ensure Afib has resolved  -Telemetry   Sickle cell anemia Hgb 4.6 (baseline 8.0). History of multiple transfusions due to sickle cell anemia.  Reticular cell count ranges from 10-15% baseline, 5.8 on admission.  On hydroxyurea 500 mg daily.  -Follow CBC and reticulocyte count  HFmrEF Last echo 07/2017. EF 40-45% G1DD.  No evidence of volume overload on admission.  Takes Coreg 12.5 twice daily, hydralazine 50 mg twice daily, torsemide 100 mg daily, Imdur 60 mg daily -Will hold IMDUR, hydralazine and Torsemide  -Daily weights and strict I's and O's  CKD IV BUN/creatinine 153/4.8. Patient still makes some urine.  Right arm AV present. -Daily BMP to follow creatinine -Strict I's and O's  Visually impaired Patient is legally blind baseline.  Stable  Gout No evidence of active gout flare.  On allopurinol 100 mg twice daily at home -Continue allopurinol  FEN/GI: N.p.o. for endoscopy Prophylaxis: SCDs  Disposition: Inpatient admission for blood transfusion and EGD   History of Present Illness:  Patrick [name] is a 62 y.o. male presenting with 1 day of hematemesis.  Patient got off work yesterday and said that he felt bad to his stomach.  He came home and lie down.  During the middle the night he vomited 4 times, some of them had dark blood in them.  After that he had several episodes of diarrhea that he said looked much darker than his regular stool.  Due to his visual impairment he said is very hard to determine if there was blood in his stool.  Patient came to the ED today after feeling persistently weak and lightheaded.  He has never had anything like this.  He reports that his girlfriend also had some vomiting a few days ago.  In the ED patient presented profoundly anemic  with hemoglobin 4.6.  He is anticoagulated with warfarin for A. fib and had a supratherapeutic INR of 3.8. In ED, transfusion of pRBCx3 was started and, warfarin reversal with Kcentra and vitamin K was given.  Patient also received IV Protonix.  Review Of Systems: Per HPI with the following additions:   Review of Systems  Constitutional: Positive for chills. Negative for fever.  Respiratory: Negative for cough and shortness of breath.   Cardiovascular: Negative for chest pain, palpitations, leg swelling and PND.  Gastrointestinal: Positive for abdominal pain, blood in stool, diarrhea, melena, nausea and vomiting. Negative for constipation and heartburn.  Genitourinary: Negative for dysuria, flank pain and urgency.  Musculoskeletal: Negative for back pain, falls and myalgias.  Neurological: Positive for dizziness.    Patient Active Problem List   Diagnosis Date Noted  . Colon cancer screening 07/16/2017  . Encounter for therapeutic drug monitoring 12/29/2015  . 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  . Atrial fibrillation ([place]) 12/24/2015  . Cough with sputum 10/25/2015  . Muscle tightness-Right arm  05/27/2014  . Arm pain, right 05/06/2014  . End stage renal disease ([place]) 05/06/2014  . Chest pain 01/13/2014  . Vitamin D deficiency 09/25/2013  . CKD (chronic kidney disease), stage IV ([place]) 09/25/2013  . Elevated uric acid in blood 09/25/2013  . Hb-SS disease without crisis ([place]) 09/25/2013  . Anemia 09/25/2013  . Onychomycosis of toenail 08/27/2013  . Cellulitis 08/27/2013  . Hyperglycemia 08/27/2013  . CHF (congestive heart failure) ([place]) 08/19/2013  . Sickle cell anemia ([place]) 11/21/2012  . Tobacco abuse 10/29/2011  . NSVT (nonsustained ventricular tachycardia) ([place]) 10/21/2011  . Gout 10/19/2011  . Sickle cell crisis ([place]) 10/17/2011  . HTN (hypertension) 06/07/2011    Past Medical History: Past Medical History:  Diagnosis Date  . Blood dyscrasia   . Blood transfusion    "I've  had a bunch of them"  . CHF (congestive heart failure) ([place])    followed by hf clinic  . Chronic kidney disease (CKD), stage III (moderate) ([facility])   . Gout   . LZJQBHAL(937.9)    "probably weekly" (01/13/2014)  . Legally blind   . Shortness of breath dyspnea   . Sickle cell disease, type SS ([place])   . Swelling abdomen   . Swelling of extremity, left   . Swelling of extremity, right     Past Surgical History: Past Surgical History:  Procedure Laterality Date  . BASCILIC VEIN TRANSPOSITION Right 04/12/2014   Procedure: BASILIC VEIN TRANSPOSITION;  Surgeon: Elam Dutch, MD;  Location: [place];  Service: Vascular;  Laterality: Right;  . CARDIAC CATHETERIZATION  05/2011  . LEFT AND RIGHT HEART CATHETERIZATION WITH CORONARY ANGIOGRAM N/A 06/11/2011   Procedure: LEFT AND RIGHT HEART CATHETERIZATION WITH CORONARY ANGIOGRAM;  Surgeon: Larey Dresser, MD;  Location: [facility] CATH LAB;  Service: Cardiovascular;  Laterality: N/A;  . TEE WITHOUT CARDIOVERSION N/A 12/27/2015   Procedure: TRANSESOPHAGEAL ECHOCARDIOGRAM (TEE);  Surgeon: Sueanne Margarita, MD;  Location: [name] ENDOSCOPY;  Service: Cardiovascular;  Laterality: N/A;    Social History: Social History   Tobacco Use  . Smoking status: Former Smoker    Packs/day: 0.25    Years: 41.00    Pack years: 10.25    Types: Cigarettes  Last attempt to quit: 12/08/2015    Years since quitting: 1.7  . Smokeless tobacco: Never Used  Substance Use Topics  . Alcohol use: Yes    Alcohol/week: 1.8 oz    Types: 1 Glasses of wine, 2 Cans of beer per week    Comment: occasional  . 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  Denies any alcohol or drug use.  Has approximately 20-pack-year history of smoking.  Quit about 2 years ago.  Please also refer to relevant sections of EMR.  Family  History: Family History  Problem Relation Age of Onset  . Alcohol abuse Mother   . Liver disease Mother   . Cirrhosis Mother   . Heart attack Mother    Allergies and Medications: No Known Allergies No current facility-administered medications on file prior to encounter.    Current Outpatient Medications on File Prior to Encounter  Medication Sig Dispense Refill  . allopurinol (ZYLOPRIM) 100 MG tablet Take 1 tablet (100 mg total) by mouth daily. (Patient taking differently: Take 100 mg by mouth 2 (two) times daily. ) 90 tablet 0  . aspirin EC 81 MG tablet Take 81 mg by mouth daily.    . calcitRIOL (ROCALTROL) 0.25 MCG capsule Take 0.25 mcg by mouth daily.     . carvedilol (COREG) 12.5 MG tablet Take 1 tablet (12.5 mg total) by mouth 2 (two) times daily with a meal. 60 tablet 1  . folic acid (FOLVITE) 1 MG tablet TAKE 1 TABLET (1 MG TOTAL) BY MOUTH DAILY. 30 tablet 11  . hydrALAZINE (APRESOLINE) 50 MG tablet Take 50 mg by mouth 2 (two) times daily.    . hydroxyurea (HYDREA) 500 MG capsule TAKE ONE CAPSULE EVERY DAY WITH FOOD TO MINIMIZE GI SIDE EFFECTS 30 capsule 11  . isosorbide mononitrate (IMDUR) 60 MG 24 hr tablet Take 1 tablet (60 mg total) by mouth daily. 90 tablet 3  . sodium bicarbonate 650 MG tablet Take 650 mg by mouth 3 (three) times daily.  5  . torsemide (DEMADEX) 100 MG tablet TAKE 1 TABLET EVERY DAY MAY TAKE ADDITIONAL TABLET AS NEEDED FOR SHORTNESS OF BREATH 45 tablet 3  . warfarin (COUMADIN) 5 MG tablet TAKE 1 TABLET BY MOUTH EVERY DAY AT 6PM OR AS DIRECTED BY COUMADIN CLINIC (Patient taking differently: Take 5-7.5 mg by mouth See admin instructions. Take 1 and 1/2 tablet on Saturday then take 1 tablet all the other days) 35 tablet 3  . oxyCODONE-acetaminophen (PERCOCET) 10-325 MG tablet Take 1 tablet by mouth every 4 (four) hours as needed for pain. (Patient not taking: Reported on 09/18/2017) 60 tablet 0  . torsemide (DEMADEX) 100 MG tablet TAKE 1 TABLET EVERY DAY MAY TAKE  ADDITIONAL TABLET AS NEEDED FOR SHORTNESS OF BREATH (Patient not taking: Reported on 09/18/2017) 45 tablet 3    Objective: BP 121/77   Pulse (!) 111   Temp 98.1 F (36.7 C) (Oral)   Resp 18   SpO2 100%  Exam: Gen: NAD, resting comfortably HEENT: No JVD, visually impaired at baseline CV: Regular rhythm, tachycardia, with no murmurs appreciated Pulm: NWOB, CTAB with no crackles, wheezes, or rhonchi GI: Mild epigastric and right upper quadrant tenderness.  No rebound or guarding nondistended. MSK: no edema, cyanosis, or clubbing noted, 2+ tibial pulses Skin: warm, dry Neuro: grossly normal,  moves all extremities, alert and oriented x3 Psych: Normal affect and thought content  Labs and Imaging: CBC BMET  Recent Labs  Lab 09/18/17 2057  WBC 12.2*  HGB 4.6*  HCT 13.1*  PLT 127*   Recent Labs  Lab 09/18/17 2057  NA 135  K 3.4*  CL 102  CO2 19*  BUN 153*  CREATININE 4.83*  GLUCOSE 128*  CALCIUM 9.2     UA normal.  Chest x-ray clear.   Bonnita Hollow, MD 09/18/2017, 11:35 PM PGY-1, [place] Intern pager: [phone], text pages welcome  UPPER LEVEL ADDENDUM  I have read the above note and made revisions highlighted in blue.  Kerrin Mo, MD, PGY-3  [facility]

## 2017-09-18 NOTE — ED Provider Notes (Signed)
Signout from Dr. Regenia Skeeter at shift change See previous providers note for full H&P  Briefly, patient with history of sickle cell and end-stage renal disease on dialysis, atrial fibrillation on warfarin.  Patient developed abdominal pain and hematemesis last evening.  He had multiple episodes of emesis, 4 times with dark red blood.  Patient also had episodes of diarrhea, but did not see what it looked like.  He has been feeling weak, lightheaded, and has had shortness of breath since, but no vomiting in about 24 hours.  He reports his abdominal pain is improved from last evening.  He has mild epigastric tenderness on my exam.  Patient's baseline hemoglobin around 8.  It is dropped to 4.7.  He is anticoagulated on warfarin.  His INR is 3.86.  Plan for consultation to GI and admit to hospitalist.  I spoke with Dr. Loletha Carrow with lower GI who advised reversal of patient's warfarin with vitamin K and FFP.  Daytime gastroenterology team will see the patient tomorrow.  I spoke with Dr. Grandville Silos with the family medicine teaching service who will admit the patient for further evaluation.  Per emergency department protocol, warfarin reversal order set initiated, which included Kcentra instead of FFP.  Patient is stable with stable vitals on admission.  I discussed with Dr. Rex Kras who guided the patient's management and agrees with plan.   Frederica Kuster, PA-C 09/19/17 6701    Sherwood Gambler, MD 09/19/17 2258

## 2017-09-18 NOTE — ED Notes (Signed)
2 units blood ready.

## 2017-09-18 NOTE — ED Provider Notes (Signed)
[facility] EMERGENCY DEPARTMENT Provider Note   CSN: [id number] Arrival date & time: 09/18/17  2021     History   Chief Complaint Chief Complaint  Patient presents with  . Abdominal Pain  . Weakness    HPI Patrick [name] is a 62 y.o. male.  HPI  62 year old male with a history of CHF, chronic kidney disease, sickle cell anemia and atrial fibrillation on warfarin presents with vomiting and abdominal pain.  He states that last night he developed abdominal pain as well as right shoulder pain.  He vomited multiple times including 4 times with dark blood.  He also had multiple episodes of diarrhea but did not see what it looks like.  He is been feeling weak, lightheaded, and short of breath since.  No chest pain.  He denies any aspirin or NSAID use.  No known history of an ulcer.  Abdominal pain is much better right now and he does not have any nausea.  No further shoulder pain.  He has not had any vomiting since last night. Typical hemoglobin is around 8. Has had multiple prior blood transfusions.  Past Medical History:  Diagnosis Date  . Blood dyscrasia   . Blood transfusion    "I've had a bunch of them"  . CHF (congestive heart failure) ([place])    followed by h[facility] clinic  . Chronic kidney disease (CKD), stage III (moderate) ([facility])   . Gout   . OEVOJJKK(938.1)    "probably weekly" (01/13/2014)  . Legally blind   . Shortness of breath dyspnea   . Sickle cell disease, type SS ([place])   . Swelling abdomen   . Swelling of extremity, left   . Swelling of extremity, right     Patient Active Problem List   Diagnosis Date Noted  . Colon cancer screening 07/16/2017  . Encounter for therapeutic drug monitoring 12/29/2015  . 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  . Atrial fibrillation ([place]) 12/24/2015  . Cough with sputum 10/25/2015  . Muscle tightness-Right arm  05/27/2014  . Arm pain, right 05/06/2014  . End stage renal disease ([place]) 05/06/2014  . Chest pain 01/13/2014    . Vitamin D deficiency 09/25/2013  . CKD (chronic kidney disease), stage IV ([place]) 09/25/2013  . Elevated uric acid in blood 09/25/2013  . Hb-SS disease without crisis ([place]) 09/25/2013  . Anemia 09/25/2013  . Onychomycosis of toenail 08/27/2013  . Cellulitis 08/27/2013  . Hyperglycemia 08/27/2013  . CHF (congestive heart failure) ([place]) 08/19/2013  . Sickle cell anemia ([place]) 11/21/2012  . Tobacco abuse 10/29/2011  . NSVT (nonsustained ventricular tachycardia) ([place]) 10/21/2011  . Gout 10/19/2011  . Sickle cell crisis ([place]) 10/17/2011  . HTN (hypertension) 06/07/2011    Past Surgical History:  Procedure Laterality Date  . BASCILIC VEIN TRANSPOSITION Right 04/12/2014   Procedure: BASILIC VEIN TRANSPOSITION;  Surgeon: Elam Dutch, MD;  Location: [place];  Service: Vascular;  Laterality: Right;  . CARDIAC CATHETERIZATION  05/2011  . LEFT AND RIGHT HEART CATHETERIZATION WITH CORONARY ANGIOGRAM N/A 06/11/2011   Procedure: LEFT AND RIGHT HEART CATHETERIZATION WITH CORONARY ANGIOGRAM;  Surgeon: Larey Dresser, MD;  Location: [facility] CATH LAB;  Service: Cardiovascular;  Laterality: N/A;  . TEE WITHOUT CARDIOVERSION N/A 12/27/2015   Procedure: TRANSESOPHAGEAL ECHOCARDIOGRAM (TEE);  Surgeon: Sueanne Margarita, MD;  Location[facility] ENDOSCOPY;  Service: Cardiovascular;  Laterality: N/A;        Home Medications    Prior to Admission medications   Medication Sig Start Date  End Date Taking? Authorizing Provider  allopurinol (ZYLOPRIM) 100 MG tablet Take 1 tablet (100 mg total) by mouth daily. 06/14/16   [name], MD  aspirin EC 81 MG tablet Take 81 mg by mouth daily.    [provider]  calcitRIOL (ROCALTROL) 0.25 MCG capsule Take 0.25 mcg by mouth daily.     [provider]  carvedilol (COREG) 12.5 MG tablet Take 1 tablet (12.5 mg total) by mouth 2 (two) times daily with a meal. 08/21/17   Bensimhon, Shaune Pascal, MD  folic acid (FOLVITE) 1 MG tablet TAKE 1 TABLET (1 MG TOTAL)  BY MOUTH DAILY. 05/31/17   Dorena Dew, FNP  hydrALAZINE (APRESOLINE) 50 MG tablet Take 50 mg by mouth 2 (two) times daily.    [provider]  hydroxyurea (HYDREA) 500 MG capsule TAKE ONE CAPSULE EVERY DAY WITH FOOD TO MINIMIZE GI SIDE EFFECTS 05/31/17   Dorena Dew, FNP  isosorbide mononitrate (IMDUR) 60 MG 24 hr tablet Take 1 tablet (60 mg total) by mouth daily. 07/11/16   Bensimhon, Shaune Pascal, MD  oxyCODONE-acetaminophen (PERCOCET) 10-325 MG tablet Take 1 tablet by mouth every 4 (four) hours as needed for pain. 05/31/17   Dorena Dew, FNP  torsemide (DEMADEX) 100 MG tablet TAKE 1 TABLET EVERY DAY MAY TAKE ADDITIONAL TABLET AS NEEDED FOR SHORTNESS OF BREATH 12/24/16   Bensimhon, Shaune Pascal, MD  torsemide (DEMADEX) 100 MG tablet TAKE 1 TABLET EVERY DAY MAY TAKE ADDITIONAL TABLET AS NEEDED FOR SHORTNESS OF BREATH 08/01/17   [name], MD  warfarin (COUMADIN) 5 MG tablet TAKE 1 TABLET BY MOUTH EVERY DAY AT 6PM OR AS DIRECTED BY COUMADIN CLINIC 03/05/17   Bensimhon, Shaune Pascal, MD    Family History Family History  Problem Relation Age of Onset  . Alcohol abuse Mother   . Liver disease Mother   . Cirrhosis Mother   . Heart attack Mother     Social History Social History   Tobacco Use  . Smoking status: Former Smoker    Packs/day: 0.25    Years: 41.00    Pack years: 10.25    Types: Cigarettes    Last attempt to quit: 12/08/2015    Years since quitting: 1.7  . Smokeless tobacco: Never Used  Substance Use Topics  . Alcohol use: Yes    Alcohol/week: 1.8 oz    Types: 1 Glasses of wine, 2 Cans of beer per week    Comment: occasional  . Drug use: No     Allergies   Patient has no known allergies.   Review of Systems Review of Systems  Respiratory: Positive for shortness of breath.   Cardiovascular: Negative for chest pain.  Gastrointestinal: Positive for abdominal pain, diarrhea and vomiting. Negative for blood in stool.  Neurological: Positive for  weakness and light-headedness.  All other systems reviewed and are negative.    Physical Exam Updated Vital Signs BP 91/63   Pulse (!) 123   Temp 98 F (36.7 C) (Oral)   Resp 18   SpO2 100%   Physical Exam  Constitutional: He is oriented to person, place, and time. He appears well-developed and well-nourished.  HENT:  Head: Normocephalic and atraumatic.  Right Ear: External ear normal.  Left Ear: External ear normal.  Nose: Nose normal.  Eyes: Right eye exhibits no discharge. Left eye exhibits no discharge.  Neck: Neck supple.  Cardiovascular: Regular rhythm, normal heart sounds and intact distal pulses. Tachycardia present.  Pulmonary/Chest: Effort  normal and breath sounds normal.  Abdominal: Soft. He exhibits no distension. There is no tenderness.  Musculoskeletal: He exhibits no edema.  Neurological: He is alert and oriented to person, place, and time.  Skin: Skin is warm and dry. There is pallor.  Nursing note and vitals reviewed.    ED Treatments / Results  Labs (all labs ordered are listed, but only abnormal results are displayed) Labs Reviewed  BASIC METABOLIC PANEL - Abnormal; Notable for the following components:      Result Value   Potassium 3.4 (*)    CO2 19 (*)    Glucose, Bld 128 (*)    BUN 153 (*)    Creatinine, Ser 4.83 (*)    GFR calc non Af Amer 12 (*)    GFR calc Af Amer 14 (*)    All other components within normal limits  CBC - Abnormal; Notable for the following components:   WBC 12.2 (*)    RBC 1.37 (*)    Hemoglobin 4.6 (*)    HCT 13.1 (*)    RDW 22.7 (*)    Platelets 127 (*)    All other components within normal limits  URINALYSIS, ROUTINE W REFLEX MICROSCOPIC  PROTIME-INR  HEPATIC FUNCTION PANEL  RETICULOCYTES  CBG MONITORING, ED  TYPE AND SCREEN  PREPARE RBC (CROSSMATCH)    EKG EKG Interpretation  Date/Time:  Wednesday September 18 2017 20:59:21 EDT Ventricular Rate:  117 PR Interval:    QRS Duration: 88 QT  Interval:  318 QTC Calculation: 443 R Axis:   72 Text Interpretation:  Atrial fibrillation with rapid ventricular response Minimal voltage criteria for LVH, may be normal variant Abnormal ECG Confirmed by Sherwood Gambler [id number]) on 09/18/2017 9:37:41 PM   Radiology No results found.  Procedures .Critical Care Performed by: Sherwood Gambler, MD Authorized by: Sherwood Gambler, MD   Critical care provider statement:    Critical care time (minutes):  35   Critical care time was exclusive of:  Separately billable procedures and treating other patients   Critical care was necessary to treat or prevent imminent or life-threatening deterioration of the following conditions:  Shock, CNS failure or compromise and circulatory failure   Critical care was time spent personally by me on the following activities:  Blood draw for specimens, development of treatment plan with patient or surrogate, evaluation of patient's response to treatment, examination of patient, obtaining history from patient or surrogate, ordering and performing treatments and interventions, ordering and review of laboratory studies, ordering and review of radiographic studies, pulse oximetry, re-evaluation of patient's condition and review of old charts   (including critical care time)  Medications Ordered in ED Medications  pantoprazole (PROTONIX) injection 40 mg (has no administration in time range)  0.9 %  sodium chloride infusion (has no administration in time range)     Initial Impression / Assessment and Plan / ED Course  I have reviewed the triage vital signs and the nursing notes.  Pertinent labs & imaging results that were available during my care of the patient were reviewed by me and considered in my medical decision making (see chart for details).     Patient initially hypotensive in triage but on reevaluation his blood pressure has come up over 485 systolic and is now 462/70.  He has not had any hematemesis for  about 24 hours.  Hemoglobin of 4 is much lower than his typical baseline around 8 and he will be transfused 2 units of blood.  He currently  appears mildly uncomfortable but not distressed.  Abdominal exam benign.  This is probably an upper GI bleed and he was given IV Protonix.  INR pending.  He will need GI consult and admission.  Care transferred to [place] [facility] with admission and results pending.  Final Clinical Impressions(s) / ED Diagnoses   Final diagnoses:  Acute GI bleeding  Symptomatic anemia    ED Discharge Orders    None       Sherwood Gambler, MD 09/18/17 2228

## 2017-09-19 ENCOUNTER — Inpatient Hospital Stay (HOSPITAL_COMMUNITY): Payer: PPO

## 2017-09-19 ENCOUNTER — Other Ambulatory Visit: Payer: Self-pay

## 2017-09-19 ENCOUNTER — Encounter (HOSPITAL_COMMUNITY)
Admission: EM | Disposition: A | Discharge status: Home / Self Care | Payer: Self-pay | Source: Home / Self Care | Attending: Family Medicine

## 2017-09-19 ENCOUNTER — Encounter (HOSPITAL_COMMUNITY): Payer: Self-pay | Admitting: Emergency Medicine

## 2017-09-19 DIAGNOSIS — M109 Gout, unspecified: Secondary | ICD-10-CM | POA: Diagnosis present

## 2017-09-19 DIAGNOSIS — A084 Viral intestinal infection, unspecified: Secondary | ICD-10-CM | POA: Diagnosis present

## 2017-09-19 DIAGNOSIS — E876 Hypokalemia: Secondary | ICD-10-CM | POA: Diagnosis present

## 2017-09-19 DIAGNOSIS — I482 Chronic atrial fibrillation: Secondary | ICD-10-CM | POA: Diagnosis not present

## 2017-09-19 DIAGNOSIS — R109 Unspecified abdominal pain: Secondary | ICD-10-CM | POA: Diagnosis not present

## 2017-09-19 DIAGNOSIS — I428 Other cardiomyopathies: Secondary | ICD-10-CM | POA: Diagnosis present

## 2017-09-19 DIAGNOSIS — Z7901 Long term (current) use of anticoagulants: Secondary | ICD-10-CM | POA: Diagnosis not present

## 2017-09-19 DIAGNOSIS — D649 Anemia, unspecified: Secondary | ICD-10-CM | POA: Diagnosis not present

## 2017-09-19 DIAGNOSIS — I5033 Acute on chronic diastolic (congestive) heart failure: Secondary | ICD-10-CM | POA: Diagnosis not present

## 2017-09-19 DIAGNOSIS — R1013 Epigastric pain: Secondary | ICD-10-CM | POA: Diagnosis not present

## 2017-09-19 DIAGNOSIS — Z7982 Long term (current) use of aspirin: Secondary | ICD-10-CM | POA: Diagnosis not present

## 2017-09-19 DIAGNOSIS — D62 Acute posthemorrhagic anemia: Secondary | ICD-10-CM

## 2017-09-19 DIAGNOSIS — R1084 Generalized abdominal pain: Secondary | ICD-10-CM | POA: Diagnosis not present

## 2017-09-19 DIAGNOSIS — I5032 Chronic diastolic (congestive) heart failure: Secondary | ICD-10-CM | POA: Diagnosis not present

## 2017-09-19 DIAGNOSIS — A045 Campylobacter enteritis: Secondary | ICD-10-CM | POA: Diagnosis not present

## 2017-09-19 DIAGNOSIS — D571 Sickle-cell disease without crisis: Secondary | ICD-10-CM | POA: Diagnosis present

## 2017-09-19 DIAGNOSIS — K828 Other specified diseases of gallbladder: Secondary | ICD-10-CM | POA: Diagnosis present

## 2017-09-19 DIAGNOSIS — Z79899 Other long term (current) drug therapy: Secondary | ICD-10-CM | POA: Diagnosis not present

## 2017-09-19 DIAGNOSIS — D5700 Hb-SS disease with crisis, unspecified: Secondary | ICD-10-CM | POA: Diagnosis not present

## 2017-09-19 DIAGNOSIS — K2960 Other gastritis without bleeding: Secondary | ICD-10-CM | POA: Diagnosis present

## 2017-09-19 DIAGNOSIS — K648 Other hemorrhoids: Secondary | ICD-10-CM | POA: Diagnosis present

## 2017-09-19 DIAGNOSIS — K922 Gastrointestinal hemorrhage, unspecified: Principal | ICD-10-CM

## 2017-09-19 DIAGNOSIS — N185 Chronic kidney disease, stage 5: Secondary | ICD-10-CM | POA: Diagnosis present

## 2017-09-19 DIAGNOSIS — D696 Thrombocytopenia, unspecified: Secondary | ICD-10-CM | POA: Diagnosis present

## 2017-09-19 DIAGNOSIS — D500 Iron deficiency anemia secondary to blood loss (chronic): Secondary | ICD-10-CM | POA: Diagnosis not present

## 2017-09-19 DIAGNOSIS — Z87891 Personal history of nicotine dependence: Secondary | ICD-10-CM | POA: Diagnosis not present

## 2017-09-19 DIAGNOSIS — K8020 Calculus of gallbladder without cholecystitis without obstruction: Secondary | ICD-10-CM | POA: Diagnosis present

## 2017-09-19 DIAGNOSIS — I5022 Chronic systolic (congestive) heart failure: Secondary | ICD-10-CM | POA: Diagnosis present

## 2017-09-19 DIAGNOSIS — K920 Hematemesis: Secondary | ICD-10-CM

## 2017-09-19 DIAGNOSIS — H3530 Unspecified macular degeneration: Secondary | ICD-10-CM | POA: Diagnosis present

## 2017-09-19 DIAGNOSIS — I132 Hypertensive heart and chronic kidney disease with heart failure and with stage 5 chronic kidney disease, or end stage renal disease: Secondary | ICD-10-CM | POA: Diagnosis present

## 2017-09-19 DIAGNOSIS — H548 Legal blindness, as defined in USA: Secondary | ICD-10-CM | POA: Diagnosis present

## 2017-09-19 DIAGNOSIS — I4891 Unspecified atrial fibrillation: Secondary | ICD-10-CM | POA: Diagnosis present

## 2017-09-19 DIAGNOSIS — D638 Anemia in other chronic diseases classified elsewhere: Secondary | ICD-10-CM | POA: Diagnosis present

## 2017-09-19 HISTORY — PX: ESOPHAGOGASTRODUODENOSCOPY: SHX5428

## 2017-09-19 LAB — CBC
HCT: 20.4 % — ABNORMAL LOW (ref 39.0–52.0)
HCT: 25 % — ABNORMAL LOW (ref 39.0–52.0)
Hemoglobin: 7.1 g/dL — ABNORMAL LOW (ref 13.0–17.0)
Hemoglobin: 8.5 g/dL — ABNORMAL LOW (ref 13.0–17.0)
MCH: 32.1 pg (ref 26.0–34.0)
MCH: 30.7 pg (ref 26.0–34.0)
MCHC: 34.8 g/dL (ref 30.0–36.0)
MCHC: 34 g/dL (ref 30.0–36.0)
MCV: 92.3 fL (ref 78.0–100.0)
MCV: 90.3 fL (ref 78.0–100.0)
Platelets: 154 10*3/uL (ref 150–400)
Platelets: 144 10*3/uL — ABNORMAL LOW (ref 150–400)
RBC: 2.21 MIL/uL — ABNORMAL LOW (ref 4.22–5.81)
RBC: 2.77 MIL/uL — ABNORMAL LOW (ref 4.22–5.81)
RDW: 20.7 % — ABNORMAL HIGH (ref 11.5–15.5)
RDW: 20 % — ABNORMAL HIGH (ref 11.5–15.5)
WBC: 11.2 10*3/uL — ABNORMAL HIGH (ref 4.0–10.5)
WBC: 9.7 10*3/uL (ref 4.0–10.5)

## 2017-09-19 LAB — RETICULOCYTES
RBC.: 2.21 MIL/uL — ABNORMAL LOW (ref 4.22–5.81)
Retic Count, Absolute: 148.1 10*3/uL (ref 19.0–186.0)
Retic Ct Pct: 6.7 % — ABNORMAL HIGH (ref 0.4–3.1)

## 2017-09-19 LAB — HIV ANTIBODY (ROUTINE TESTING W REFLEX): HIV Screen 4th Generation wRfx: NONREACTIVE

## 2017-09-19 LAB — TROPONIN I
Troponin I: 0.08 ng/mL (ref <0.03)
Troponin I: 0.04 ng/mL (ref <0.03)

## 2017-09-19 LAB — MAGNESIUM: Magnesium: 2.2 mg/dL (ref 1.7–2.4)

## 2017-09-19 LAB — PREPARE RBC (CROSSMATCH)

## 2017-09-19 LAB — LIPASE, BLOOD: Lipase: 79 U/L — ABNORMAL HIGH (ref 11–51)

## 2017-09-19 LAB — POC OCCULT BLOOD, ED: Fecal Occult Bld: POSITIVE — AB

## 2017-09-19 LAB — PROTIME-INR
INR: 1.5
Prothrombin Time: 18 seconds — ABNORMAL HIGH (ref 11.4–15.2)

## 2017-09-19 LAB — PHOSPHORUS: Phosphorus: 3.3 mg/dL (ref 2.5–4.6)

## 2017-09-19 LAB — MRSA PCR SCREENING: MRSA by PCR: NEGATIVE

## 2017-09-19 SURGERY — EGD (ESOPHAGOGASTRODUODENOSCOPY)
Anesthesia: Moderate Sedation

## 2017-09-19 MED ORDER — ONDANSETRON HCL 4 MG/2ML IJ SOLN: 4.0000 mg | Freq: Once | INTRAMUSCULAR | Status: DC

## 2017-09-19 MED ORDER — CARVEDILOL 12.5 MG PO TABS
12.5000 mg | ORAL_TABLET | Freq: Two times a day (BID) | ORAL | Status: DC
  Administered 2017-09-19 – 2017-09-24 (×9): 12.5 mg via ORAL
  Filled 2017-09-19 (×9): qty 1

## 2017-09-19 MED ORDER — ACETAMINOPHEN 325 MG PO TABS
650.0000 mg | ORAL_TABLET | Freq: Four times a day (QID) | ORAL | Status: DC | PRN
  Administered 2017-09-19 – 2017-09-21 (×3): 650 mg via ORAL
  Filled 2017-09-19 (×3): qty 2

## 2017-09-19 MED ORDER — TORSEMIDE 20 MG PO TABS: 100.0000 mg | ORAL_TABLET | Freq: Every day | ORAL | Status: DC

## 2017-09-19 MED ORDER — DIPHENHYDRAMINE HCL 50 MG/ML IJ SOLN
INTRAMUSCULAR | Status: AC
  Filled 2017-09-19: qty 1

## 2017-09-19 MED ORDER — CALCITRIOL 0.25 MCG PO CAPS
0.2500 ug | ORAL_CAPSULE | Freq: Every day | ORAL | Status: DC
  Administered 2017-09-19 – 2017-09-24 (×6): 0.25 ug via ORAL
  Filled 2017-09-19 (×6): qty 1

## 2017-09-19 MED ORDER — ISOSORBIDE MONONITRATE ER 30 MG PO TB24: 60.0000 mg | ORAL_TABLET | Freq: Every day | ORAL | Status: DC

## 2017-09-19 MED ORDER — HYDROXYUREA 500 MG PO CAPS
500.0000 mg | ORAL_CAPSULE | Freq: Every day | ORAL | Status: DC
  Administered 2017-09-20 – 2017-09-24 (×5): 500 mg via ORAL
  Filled 2017-09-19 (×6): qty 1

## 2017-09-19 MED ORDER — FENTANYL CITRATE (PF) 100 MCG/2ML IJ SOLN
INTRAMUSCULAR | Status: AC
  Filled 2017-09-19: qty 4

## 2017-09-19 MED ORDER — GI COCKTAIL ~~LOC~~: 30.0000 mL | Freq: Two times a day (BID) | ORAL | Status: DC | PRN

## 2017-09-19 MED ORDER — PANTOPRAZOLE SODIUM 40 MG IV SOLR: 40.0000 mg | Freq: Once | INTRAVENOUS | Status: DC

## 2017-09-19 MED ORDER — FOLIC ACID 1 MG PO TABS
1.0000 mg | ORAL_TABLET | Freq: Every day | ORAL | Status: DC
  Administered 2017-09-19 – 2017-09-24 (×6): 1 mg via ORAL
  Filled 2017-09-19 (×6): qty 1

## 2017-09-19 MED ORDER — ALLOPURINOL 100 MG PO TABS
100.0000 mg | ORAL_TABLET | Freq: Two times a day (BID) | ORAL | Status: DC
  Administered 2017-09-19 – 2017-09-24 (×11): 100 mg via ORAL
  Filled 2017-09-19 (×12): qty 1

## 2017-09-19 MED ORDER — LIDOCAINE 5 % EX PTCH
1.0000 | MEDICATED_PATCH | CUTANEOUS | Status: DC
  Administered 2017-09-19: 1 via TRANSDERMAL
  Filled 2017-09-19 (×4): qty 1

## 2017-09-19 MED ORDER — HYDRALAZINE HCL 50 MG PO TABS: 50.0000 mg | ORAL_TABLET | Freq: Two times a day (BID) | ORAL | Status: DC

## 2017-09-19 MED ORDER — ONDANSETRON HCL 4 MG/2ML IJ SOLN
4.0000 mg | Freq: Three times a day (TID) | INTRAMUSCULAR | Status: DC | PRN
  Administered 2017-09-21 – 2017-09-24 (×3): 4 mg via INTRAVENOUS
  Filled 2017-09-19 (×3): qty 2

## 2017-09-19 MED ORDER — IOPAMIDOL (ISOVUE-300) INJECTION 61%
INTRAVENOUS | Status: AC
  Administered 2017-09-19: 16:00:00
  Filled 2017-09-19: qty 30

## 2017-09-19 MED ORDER — SODIUM CHLORIDE 0.9 % IV SOLN
20.0000 ug | Freq: Once | INTRAVENOUS | Status: AC
  Administered 2017-09-19: 20 ug via INTRAVENOUS
  Filled 2017-09-19: qty 5

## 2017-09-19 MED ORDER — ACETAMINOPHEN 650 MG RE SUPP: 650.0000 mg | Freq: Four times a day (QID) | RECTAL | Status: DC | PRN

## 2017-09-19 MED ORDER — ADULT MULTIVITAMIN W/MINERALS CH
1.0000 | ORAL_TABLET | Freq: Every day | ORAL | Status: DC
  Administered 2017-09-19 – 2017-09-24 (×6): 1 via ORAL
  Filled 2017-09-19 (×6): qty 1

## 2017-09-19 MED ORDER — MIDAZOLAM HCL 5 MG/ML IJ SOLN
INTRAMUSCULAR | Status: AC
  Filled 2017-09-19: qty 3

## 2017-09-19 MED ORDER — PANTOPRAZOLE SODIUM 40 MG IV SOLR
40.0000 mg | Freq: Two times a day (BID) | INTRAVENOUS | Status: DC
  Administered 2017-09-19 (×2): 40 mg via INTRAVENOUS
  Filled 2017-09-19 (×2): qty 40

## 2017-09-19 MED ORDER — HYDROXYUREA 500 MG PO CAPS
500.0000 mg | ORAL_CAPSULE | Freq: Every day | ORAL | Status: DC
  Filled 2017-09-19: qty 1

## 2017-09-19 MED ORDER — MIDAZOLAM HCL 10 MG/2ML IJ SOLN
INTRAMUSCULAR | Status: DC | PRN
  Administered 2017-09-19: 1 mg via INTRAVENOUS
  Administered 2017-09-19: 2 mg via INTRAVENOUS
  Administered 2017-09-19: 1 mg via INTRAVENOUS
  Administered 2017-09-19: 2 mg via INTRAVENOUS

## 2017-09-19 MED ORDER — FENTANYL CITRATE (PF) 100 MCG/2ML IJ SOLN
INTRAMUSCULAR | Status: DC | PRN
  Administered 2017-09-19 (×4): 25 ug via INTRAVENOUS

## 2017-09-19 NOTE — ED Notes (Signed)
Unable to draw labs per poklicy as blood is infusing

## 2017-09-19 NOTE — ED Notes (Signed)
Intern contacted with continued pain 8/10 for back and 6/10 abdomen. Pt states his back pain is similar to his sickle cell pain and started before current transfusion. Provider informed of same but made aware that transfusion had just ended and was being flushed. No new orders at this time. MD also informed that pt is scheduled for EGD at 1345 and has room assigned.

## 2017-09-19 NOTE — Interval H&P Note (Signed)
History and Physical Interval Note:  09/19/2017 1:54 PM  Patrick [name]  has presented today for surgery, with the diagnosis of hematemesis. The various methods of treatment have been discussed with the patient. After consideration of risks, benefits and other options for treatment, the patient has consented to  Procedure(s): ESOPHAGOGASTRODUODENOSCOPY (EGD) (N/A) as a surgical intervention .  The patient's history has been reviewed, patient examined, no change in status, stable for surgery.  I have reviewed the patient's chart and labs.  Questions were answered to the patient's satisfaction.     Jackquline Denmark

## 2017-09-19 NOTE — H&P (View-Only) (Signed)
[facility] Consult: 10:01 AM 09/19/2017  LOS: 0 days    Referring Provider: Dr Ardelia Mems Primary Care Physician:  [name], MD Primary Gastroenterologist: Althia Forts.    Reason for Consultation: Hematemesis.   HPI: Patrick [name] is a 62 y.o. male.  Hx SS disease.  Multiple blood transfusions.  Legally blind due to macular degeneration.  CKD stage 5.  Systolic heart failure due to non-ischemic cardiomyopathy.  EF 40% on ech 07/19/17.  No CAD on catheterization 2012.  On chronic Coumadin for A. Fib.  Gout   After laying down to go to sleep Tuesday night, patient felt unwell and nauseous.  Early Wednesday morning he developed nausea and vomiting.  From the start he was vomiting burgundy bloody material.  It happened several times but stopped after a few hours.  He also had some mild to moderate pain in his right abdomen radiating around to his thorax.  Later that morning he had a diarrheal stools, he is not clear whether or not these were bloody or dark.  These also stopped after a few hours but he was feeling quite weak and dizzy.  He called his cousin and was brought to the emergency department.  Neither the vomiting or diarrhea have occurred. Mild epigastric tenderness noted on exam in the ED.  Rectal exam was performed, FOBT positive though I do not see description of what the stool looked like. Hgb 4.6 >> transfused 2 U PRBCs >> 7.1.  1 more PRBC transfused.  Platelets 127. Lipase 79.  Total bilirubin 1.3.  Alkaline phosphatase 36.  AST/ALT 29/25. INR 3.8 >> 1.5 after Vit K, K centra.  Abdominal ultrasound shows multiple gallstones and gallbladder sludge.  Borderline GB wall thickness, mild pericholecystic edema.  Negative Murphy sign.  8.8 mm CBD, no stone within CBD.  Liver normal. Portal vein  patent with normal directional blood flow. Started on Protonix 40 mg IV BID.    At baseline pt has no stomach or digestive issues.  He has a good appetite.  Weight has been stable.  No dysphagia.  No change in bowel habits.  No prior colonoscopy or upper endoscopy. Patient has never been a heavy drinker and has not had any up of alcoholic beverage since around 2015. Family history negative for colorectal cancer, GI bleed, ulcer disease.  Past Medical History:  Diagnosis Date  . Blood dyscrasia   . Blood transfusion    "I've had a bunch of them"  . CHF (congestive heart failure) ([place])    followed by hf clinic  . Chronic kidney disease (CKD), stage III (moderate) ([facility])   . Gout   . NWGNFAOZ(308.6)    "probably weekly" (01/13/2014)  . Legally blind   . Shortness of breath dyspnea   . Sickle cell disease, type SS ([place])   . Swelling abdomen   . Swelling of extremity, left   . Swelling of extremity, right     Past Surgical History:  Procedure Laterality Date  . BASCILIC VEIN TRANSPOSITION Right 04/12/2014   Procedure: BASILIC VEIN TRANSPOSITION;  Surgeon: Elam Dutch, MD;  Location: [facility];  Service: Vascular;  Laterality: Right;  . CARDIAC CATHETERIZATION  05/2011  . LEFT AND RIGHT HEART CATHETERIZATION WITH CORONARY ANGIOGRAM N/A 06/11/2011   Procedure: LEFT AND RIGHT HEART CATHETERIZATION WITH CORONARY ANGIOGRAM;  Surgeon: Larey Dresser, MD;  Location: [facility] CATH LAB;  Service: Cardiovascular;  Laterality: N/A;  . TEE WITHOUT CARDIOVERSION N/A 12/27/2015   Procedure: TRANSESOPHAGEAL ECHOCARDIOGRAM (TEE);  Surgeon: Sueanne Margarita, MD;  Location: [facility] ENDOSCOPY;  Service: Cardiovascular;  Laterality: N/A;    Prior to Admission medications   Medication Sig Start Date End Date Taking? Authorizing Provider  allopurinol (ZYLOPRIM) 100 MG tablet Take 1 tablet (100 mg total) by mouth daily. Patient taking differently: Take 100 mg by mouth 2 (two) times daily.  06/14/16  Yes [name], MD  aspirin EC 81 MG tablet Take 81 mg by mouth daily.   Yes [provider]  calcitRIOL (ROCALTROL) 0.25 MCG capsule Take 0.25 mcg by mouth daily.    Yes [provider]  carvedilol (COREG) 12.5 MG tablet Take 1 tablet (12.5 mg total) by mouth 2 (two) times daily with a meal. 08/21/17  Yes Bensimhon, Shaune Pascal, MD  folic acid (FOLVITE) 1 MG tablet TAKE 1 TABLET (1 MG TOTAL) BY MOUTH DAILY. 05/31/17  Yes Dorena Dew, FNP  hydrALAZINE (APRESOLINE) 50 MG tablet Take 50 mg by mouth 2 (two) times daily.   Yes [provider]  hydroxyurea (HYDREA) 500 MG capsule TAKE ONE CAPSULE EVERY DAY WITH FOOD TO MINIMIZE GI SIDE EFFECTS 05/31/17  Yes Dorena Dew, FNP  isosorbide mononitrate (IMDUR) 60 MG 24 hr tablet Take 1 tablet (60 mg total) by mouth daily. 07/11/16  Yes Bensimhon, Shaune Pascal, MD  sodium bicarbonate 650 MG tablet Take 650 mg by mouth 3 (three) times daily. 08/30/17  Yes [provider]  torsemide (DEMADEX) 100 MG tablet TAKE 1 TABLET EVERY DAY MAY TAKE ADDITIONAL TABLET AS NEEDED FOR SHORTNESS OF BREATH 08/01/17  Yes Jegede, Olugbemiga E, MD  warfarin (COUMADIN) 5 MG tablet TAKE 1 TABLET BY MOUTH EVERY DAY AT 6PM OR AS DIRECTED BY COUMADIN CLINIC Patient taking differently: Take 5-7.5 mg by mouth See admin instructions. Take 1 and 1/2 tablet on Saturday then take 1 tablet all the other days 03/05/17  Yes Bensimhon, Shaune Pascal, MD  torsemide (DEMADEX) 100 MG tablet TAKE 1 TABLET EVERY DAY MAY TAKE ADDITIONAL TABLET AS NEEDED FOR SHORTNESS OF BREATH Patient not taking: Reported on 09/18/2017 12/24/16   Bensimhon, Shaune Pascal, MD    Scheduled Meds: . allopurinol  100 mg Oral BID  . calcitRIOL  0.25 mcg Oral Daily  . carvedilol  12.5 mg Oral BID WC  . folic acid  1 mg Oral Daily  . furosemide  40 mg Intravenous Daily  . hydroxyurea  500 mg Oral Daily  . lidocaine  1 patch Transdermal Q24H  . multivitamin with minerals  1 tablet Oral Daily  .  ondansetron (ZOFRAN) IV  4 mg Intravenous Once  . pantoprazole (PROTONIX) IV  40 mg Intravenous Once  . pantoprazole (PROTONIX) IV  40 mg Intravenous Q12H   Infusions:  PRN Meds: acetaminophen **OR** acetaminophen, gi cocktail, ondansetron (ZOFRAN) IV   Allergies as of 09/18/2017  . (No Known Allergies)    Family History  Problem Relation Age of Onset  . Alcohol abuse Mother   . Liver disease Mother   . Cirrhosis Mother   . Heart attack  Mother     Social History   Socioeconomic History  . Marital status: Divorced    Spouse name: Not on file  . Number of children: Not on file  . Years of education: Not on file  . Highest education level: Not on file  Occupational History  . Not on file  Social Needs  . Financial resource strain: Not on file  . Food insecurity:    Worry: Not on file    Inability: Not on file  . Transportation needs:    Medical: Not on file    Non-medical: Not on file  Tobacco Use  . Smoking status: Former Smoker    Packs/day: 0.25    Years: 41.00    Pack years: 10.25    Types: Cigarettes    Last attempt to quit: 12/08/2015    Years since quitting: 1.7  . Smokeless tobacco: Never Used  Substance and Sexual Activity  . Alcohol use: Yes    Alcohol/week: 1.8 oz    Types: 1 Glasses of wine, 2 Cans of beer per week    Comment: occasional  . Drug use: No  . Sexual activity: Not Currently  Lifestyle  . Physical activity:    Days per week: Not on file    Minutes per session: Not on file  . Stress: Not on file  Relationships  . Social connections:    Talks on phone: Not on file    Gets together: Not on file    Attends religious service: Not on file    Active member of club or organization: Not on file    Attends meetings of clubs or organizations: Not on file    Relationship status: Not on file  . Intimate partner violence:    Fear of current or ex partner: Not on file    Emotionally abused: Not on file    Physically abused: Not on file     Forced sexual activity: Not on file  Other Topics Concern  . Not on file  Social History Narrative  . Not on file    REVIEW OF SYSTEMS: Constitutional: Patient is feeling better but this morning felt dizzy and weak. ENT:  No nose bleeds Pulm: No shortness of breath.  No pleuritic pain, no cough. CV:  No palpitations, no LE edema.  No chest pain. GU:  No hematuria, no frequency GI:  Per HPI.   Heme: No unusual bleeding or excessive bruising. Transfusions: On multiple occasions over his lifetime. Neuro:  No headaches, no peripheral tingling or numbness.  No syncope.  No seizures.  Dizziness earlier today has resolved. Derm:  No itching, no rash or sores.  Endocrine:  No sweats or chills.  No polyuria or dysuria Immunization: Did not inquire as to recent immunizations.  He was vaccinated for Pneumovax in 2014 and 2016, had Tdap vaccination 2015. Travel:  None beyond local counties in last few months.    PHYSICAL EXAM: Vital signs in last 24 hours: Vitals:   09/19/17 0909 09/19/17 0945  BP: 128/80 134/84  Pulse: 86 88  Resp: (!) 21   Temp: 98.1 F (36.7 C)   SpO2: 97% 100%   Wt Readings from Last 3 Encounters:  07/16/17 148 lb (67.1 kg)  05/31/17 154 lb (69.9 kg)  05/07/17 155 lb (70.3 kg)    General: Pleasant, cooperative, comfortable, slender AAM Head: No facial asymmetry or swelling.  No signs of head trauma. Eyes: No scleral icterus.  No conjunctival pallor.  Eyes do not  focus well.  Did not perform ophthalmologic testing. Ears: Not hard of hearing. Nose: No congestion or discharge Mouth: Tongue midline.  Oral mucosa moist and clear.  Good dentition. Neck: No JVD, no masses, no thyromegaly Lungs: Clear lungs bilaterally.  No cough, no dyspnea. Heart: RRR.  No MRG.  S1, S2 present.   Abdomen: Thin, soft.  Not tender.  No HSM, no masses, no thyromegaly, no bruits.  Active bowel sounds..   Rectal: Did not repeat rectal exam. Musc/Skeltl: No joint redness, swelling or  significant deformity. Extremities: AV graft on the right upper extremity.  No lower extremity edema. Neurologic: Oriented x3.  Historian.  Moves all 4 limbs, strength full.  No tremors. Skin: No rashes, no sores, no suspicious lesions. Tattoos: None observed. Nodes: Cervical or inguinal adenopathy. Psych: Pleasant, calm, cooperative.  Intake/Output from previous day: 04/03 0701 - 04/04 0700 In: 1365.8 [Blood:1305.8; IV Piggyback:60] Out: -  Intake/Output this shift: Total I/O In: -  Out: 600 [Urine:600]  LAB RESULTS: Recent Labs    09/18/17 2057 09/19/17 0607  WBC 12.2* 11.2*  HGB 4.6* 7.1*  HCT 13.1* 20.4*  PLT 127* 154   BMET Lab Results  Component Value Date   NA 135 09/18/2017   NA 135 05/31/2017   NA 137 02/21/2016   K 3.4 (L) 09/18/2017   K 4.0 05/31/2017   K 3.8 02/21/2016   CL 102 09/18/2017   CL 95 (L) 05/31/2017   CL 103 02/21/2016   CO2 19 (L) 09/18/2017   CO2 20 05/31/2017   CO2 22 02/21/2016   GLUCOSE 128 (H) 09/18/2017   GLUCOSE 89 05/31/2017   GLUCOSE 89 02/21/2016   BUN 153 (H) 09/18/2017   BUN 79 (HH) 05/31/2017   BUN 88 (H) 02/21/2016   CREATININE 4.83 (H) 09/18/2017   CREATININE 5.04 (H) 05/31/2017   CREATININE 3.84 (H) 02/21/2016   CALCIUM 9.2 09/18/2017   CALCIUM 9.1 05/31/2017   CALCIUM 8.6 (L) 02/21/2016   LFT Recent Labs    09/18/17 2140  PROT 6.9  ALBUMIN 3.7  AST 29  ALT 25  ALKPHOS 36*  BILITOT 1.3*  BILIDIR 0.2  IBILI 1.1*   PT/INR Lab Results  Component Value Date   INR 1.50 09/19/2017   INR 3.86 09/18/2017   INR 2.4 08/12/2017   Hepatitis Panel No results for input(s): HEPBSAG, HCVAB, HEPAIGM, HEPBIGM in the last 72 hours. C-Diff No components found for: CDIFF Lipase     Component Value Date/Time   LIPASE 79 (H) 09/19/2017 0716     RADIOLOGY STUDIES: US Abdomen Complete  Result Date: 09/19/2017 CLINICAL DATA:  Abdominal pain.  Vomiting blood. EXAM: ABDOMEN ULTRASOUND COMPLETE COMPARISON:  CT abdomen  and pelvis 02/07/2013 FINDINGS: Gallbladder: Cholelithiasis with multiple gallstones. Largest measures about 1.7 cm. Diffuse sludge also in the gallbladder. Borderline gallbladder wall thickness at 3.2 mm. Mild pericholecystic edema. Murphy's sign is negative. Common bile duct: Diameter: 8.8 mm common dilated. No obstructing stone or lesion identified but the distal bile duct is obscured by bowel gas. Liver: No focal lesion identified. Within normal limits in parenchymal echogenicity. Portal vein is patent on color Doppler imaging with normal direction of blood flow towards the liver. IVC: No abnormality visualized. Pancreas: Visualized portion unremarkable. Spleen: Spleen is not identified. Previous CT scan demonstrated an atrophic calcified spleen. Right Kidney: Length: 8.4 cm. Diffusely increased parenchymal echotexture consistent with medical renal disease. A subcentimeter parenchymal cysts is demonstrated. No hydronephrosis. Left Kidney: Length: 10.9 cm. Diffusely increased  parenchymal echotexture consistent with medical renal disease. Small cyst on the lower pole measuring 1.6 cm maximal diameter. No hydronephrosis. Abdominal aorta: No aneurysm visualized. Other findings: None. IMPRESSION: 1. Cholelithiasis and gallbladder sludge with mild pericholecystic edema. With negative Murphy's sign, cholecystitis is less likely. Dilated common bile duct. Cause not identified. 2. Increased parenchymal echotexture in the kidney suggesting chronic medical renal disease. No hydronephrosis. Electronically Signed   By: Lucienne Capers M.D.   On: 09/19/2017 05:35   Dg Chest Portable 1 View  Result Date: 09/18/2017 CLINICAL DATA:  Hematemesis EXAM: PORTABLE CHEST 1 VIEW COMPARISON:  12/24/2015 FINDINGS: Normal heart size and pulmonary vascularity. No focal airspace disease or consolidation in the lungs. No blunting of costophrenic angles. No pneumothorax. Mediastinal contours appear intact. Calcification of the aorta.  IMPRESSION: No evidence of active pulmonary disease.  Aortic atherosclerosis. Electronically Signed   By: Lucienne Capers M.D.   On: 09/18/2017 22:15      IMPRESSION:   *  Hemetemesis. R/o ulcer disease, Mallory-Weiss tear, AVMs, neoplasia.  *   Blood loss anemia in patient with chronic anemia due to sickle cell disease.  Symptoms improved following transfusion.  Currently receiving third and final unit PRBC.  Ferritin low at 22.    *    Chronic Coumadin for history of A. fib.  Currently sinus rhythm.  Supratherapeutic INR reversed with vitamin K and K Centra.  *   Moderate right abdominal pain.  Mild elevation of lipase and total bilirubin otherwise normal LFTs.  Gallbladder stones, sludge, mild wall edema, mild CBD dilatation on ultrasound.  *   Nonischemic cardiomyopathy.  EF 40-45% per echo 2 months ago.    *   Legally blind.  *   CKD, stage 5.  S/p right UE vascular fistula.  No on HD yet.        PLAN:     *   EGD, hopefully this afternoon.  CBC 5 PM and in AM.  BMET, hepatic fx profile, lipase in AM.   Continue Protonix 40 mg IV q 12.   DDAVP per pharmacy, consult order placed.     *  Does he need HIDA or MRCP to evaluate biliary abnormalities?     Azucena Freed  09/19/2017, 10:01 AM Phone [phone]   Attending physician's note   I have taken an interval history, reviewed the chart and examined the patient. I agree with the Advanced Practitioner's note, impression and recommendations.   62 year old pleasant male with sickle cell disease, end-stage renal disease, CHF, A. Fib on Coumadin admitted with hematemesis with hemoglobin 4.6 s/p 2 units of PRBCs to hemoglobin 7.2, had Vit K and K Centra to reverse coumadin. For EGD today. Contonue IV protonix  Carmell Austria, MD

## 2017-09-19 NOTE — Consult Note (Addendum)
[facility] Consult: 10:01 AM 09/19/2017  LOS: 0 days    Referring Provider: Dr Ardelia Mems Primary Care Physician:  [name], MD Primary Gastroenterologist: Althia Forts.    Reason for Consultation: Hematemesis.   HPI: Patrick [name] is a 62 y.o. male.  Hx SS disease.  Multiple blood transfusions.  Legally blind due to macular degeneration.  CKD stage 5.  Systolic heart failure due to non-ischemic cardiomyopathy.  EF 40% on ech 07/19/17.  No CAD on catheterization 2012.  On chronic Coumadin for A. Fib.  Gout   After laying down to go to sleep Tuesday night, patient felt unwell and nauseous.  Early Wednesday morning he developed nausea and vomiting.  From the start he was vomiting burgundy bloody material.  It happened several times but stopped after a few hours.  He also had some mild to moderate pain in his right abdomen radiating around to his thorax.  Later that morning he had a diarrheal stools, he is not clear whether or not these were bloody or dark.  These also stopped after a few hours but he was feeling quite weak and dizzy.  He called his cousin and was brought to the emergency department.  Neither the vomiting or diarrhea have occurred. Mild epigastric tenderness noted on exam in the ED.  Rectal exam was performed, FOBT positive though I do not see description of what the stool looked like. Hgb 4.6 >> transfused 2 U PRBCs >> 7.1.  1 more PRBC transfused.  Platelets 127. Lipase 79.  Total bilirubin 1.3.  Alkaline phosphatase 36.  AST/ALT 29/25. INR 3.8 >> 1.5 after Vit K, K centra.  Abdominal ultrasound shows multiple gallstones and gallbladder sludge.  Borderline GB wall thickness, mild pericholecystic edema.  Negative Murphy sign.  8.8 mm CBD, no stone within CBD.  Liver normal. Portal vein  patent with normal directional blood flow. Started on Protonix 40 mg IV BID.    At baseline pt has no stomach or digestive issues.  He has a good appetite.  Weight has been stable.  No dysphagia.  No change in bowel habits.  No prior colonoscopy or upper endoscopy. Patient has never been a heavy drinker and has not had any up of alcoholic beverage since around 2015. Family history negative for colorectal cancer, GI bleed, ulcer disease.  Past Medical History:  Diagnosis Date  . Blood dyscrasia   . Blood transfusion    "I've had a bunch of them"  . CHF (congestive heart failure) ([place])    followed by hf clinic  . Chronic kidney disease (CKD), stage III (moderate) (HCC)   . Gout   . OZHYQMVH(846.9)    "probably weekly" (01/13/2014)  . Legally blind   . Shortness of breath dyspnea   . Sickle cell disease, type SS ([place])   . Swelling abdomen   . Swelling of extremity, left   . Swelling of extremity, right     Past Surgical History:  Procedure Laterality Date  . BASCILIC VEIN TRANSPOSITION Right 04/12/2014   Procedure: BASILIC VEIN TRANSPOSITION;  Surgeon: Elam Dutch, MD;  Location: [facility];  Service: Vascular;  Laterality: Right;  . CARDIAC CATHETERIZATION  05/2011  . LEFT AND RIGHT HEART CATHETERIZATION WITH CORONARY ANGIOGRAM N/A 06/11/2011   Procedure: LEFT AND RIGHT HEART CATHETERIZATION WITH CORONARY ANGIOGRAM;  Surgeon: Larey Dresser, MD;  Location: [facility] CATH LAB;  Service: Cardiovascular;  Laterality: N/A;  . TEE WITHOUT CARDIOVERSION N/A 12/27/2015   Procedure: TRANSESOPHAGEAL ECHOCARDIOGRAM (TEE);  Surgeon: Sueanne Margarita, MD;  Location: [facility] ENDOSCOPY;  Service: Cardiovascular;  Laterality: N/A;    Prior to Admission medications   Medication Sig Start Date End Date Taking? Authorizing Provider  allopurinol (ZYLOPRIM) 100 MG tablet Take 1 tablet (100 mg total) by mouth daily. Patient taking differently: Take 100 mg by mouth 2 (two) times daily.  06/14/16  Yes [name], MD  aspirin EC 81 MG tablet Take 81 mg by mouth daily.   Yes [provider]  calcitRIOL (ROCALTROL) 0.25 MCG capsule Take 0.25 mcg by mouth daily.    Yes [provider]  carvedilol (COREG) 12.5 MG tablet Take 1 tablet (12.5 mg total) by mouth 2 (two) times daily with a meal. 08/21/17  Yes Bensimhon, Shaune Pascal, MD  folic acid (FOLVITE) 1 MG tablet TAKE 1 TABLET (1 MG TOTAL) BY MOUTH DAILY. 05/31/17  Yes Dorena Dew, FNP  hydrALAZINE (APRESOLINE) 50 MG tablet Take 50 mg by mouth 2 (two) times daily.   Yes [provider]  hydroxyurea (HYDREA) 500 MG capsule TAKE ONE CAPSULE EVERY DAY WITH FOOD TO MINIMIZE GI SIDE EFFECTS 05/31/17  Yes Dorena Dew, FNP  isosorbide mononitrate (IMDUR) 60 MG 24 hr tablet Take 1 tablet (60 mg total) by mouth daily. 07/11/16  Yes Bensimhon, Shaune Pascal, MD  sodium bicarbonate 650 MG tablet Take 650 mg by mouth 3 (three) times daily. 08/30/17  Yes [provider]  torsemide (DEMADEX) 100 MG tablet TAKE 1 TABLET EVERY DAY MAY TAKE ADDITIONAL TABLET AS NEEDED FOR SHORTNESS OF BREATH 08/01/17  Yes Jegede, Olugbemiga E, MD  warfarin (COUMADIN) 5 MG tablet TAKE 1 TABLET BY MOUTH EVERY DAY AT 6PM OR AS DIRECTED BY COUMADIN CLINIC Patient taking differently: Take 5-7.5 mg by mouth See admin instructions. Take 1 and 1/2 tablet on Saturday then take 1 tablet all the other days 03/05/17  Yes Bensimhon, Shaune Pascal, MD  torsemide (DEMADEX) 100 MG tablet TAKE 1 TABLET EVERY DAY MAY TAKE ADDITIONAL TABLET AS NEEDED FOR SHORTNESS OF BREATH Patient not taking: Reported on 09/18/2017 12/24/16   Bensimhon, Shaune Pascal, MD    Scheduled Meds: . allopurinol  100 mg Oral BID  . calcitRIOL  0.25 mcg Oral Daily  . carvedilol  12.5 mg Oral BID WC  . folic acid  1 mg Oral Daily  . furosemide  40 mg Intravenous Daily  . hydroxyurea  500 mg Oral Daily  . lidocaine  1 patch Transdermal Q24H  . multivitamin with minerals  1 tablet Oral Daily  .  ondansetron (ZOFRAN) IV  4 mg Intravenous Once  . pantoprazole (PROTONIX) IV  40 mg Intravenous Once  . pantoprazole (PROTONIX) IV  40 mg Intravenous Q12H   Infusions:  PRN Meds: acetaminophen **OR** acetaminophen, gi cocktail, ondansetron (ZOFRAN) IV   Allergies as of 09/18/2017  . (No Known Allergies)    Family History  Problem Relation Age of Onset  . Alcohol abuse Mother   . Liver disease Mother   . Cirrhosis Mother   . Heart attack  Mother     Social History   Socioeconomic History  . Marital status: Divorced    Spouse name: Not on file  . Number of children: Not on file  . Years of education: Not on file  . Highest education level: Not on file  Occupational History  . Not on file  Social Needs  . Financial resource strain: Not on file  . Food insecurity:    Worry: Not on file    Inability: Not on file  . Transportation needs:    Medical: Not on file    Non-medical: Not on file  Tobacco Use  . Smoking status: Former Smoker    Packs/day: 0.25    Years: 41.00    Pack years: 10.25    Types: Cigarettes    Last attempt to quit: 12/08/2015    Years since quitting: 1.7  . Smokeless tobacco: Never Used  Substance and Sexual Activity  . Alcohol use: Yes    Alcohol/week: 1.8 oz    Types: 1 Glasses of wine, 2 Cans of beer per week    Comment: occasional  . Drug use: No  . Sexual activity: Not Currently  Lifestyle  . Physical activity:    Days per week: Not on file    Minutes per session: Not on file  . Stress: Not on file  Relationships  . Social connections:    Talks on phone: Not on file    Gets together: Not on file    Attends religious service: Not on file    Active member of club or organization: Not on file    Attends meetings of clubs or organizations: Not on file    Relationship status: Not on file  . Intimate partner violence:    Fear of current or ex partner: Not on file    Emotionally abused: Not on file    Physically abused: Not on file     Forced sexual activity: Not on file  Other Topics Concern  . Not on file  Social History Narrative  . Not on file    REVIEW OF SYSTEMS: Constitutional: Patient is feeling better but this morning felt dizzy and weak. ENT:  No nose bleeds Pulm: No shortness of breath.  No pleuritic pain, no cough. CV:  No palpitations, no LE edema.  No chest pain. GU:  No hematuria, no frequency GI:  Per HPI.   Heme: No unusual bleeding or excessive bruising. Transfusions: On multiple occasions over his lifetime. Neuro:  No headaches, no peripheral tingling or numbness.  No syncope.  No seizures.  Dizziness earlier today has resolved. Derm:  No itching, no rash or sores.  Endocrine:  No sweats or chills.  No polyuria or dysuria Immunization: Did not inquire as to recent immunizations.  He was vaccinated for Pneumovax in 2014 and 2016, had Tdap vaccination 2015. Travel:  None beyond local counties in last few months.    PHYSICAL EXAM: Vital signs in last 24 hours: Vitals:   09/19/17 0909 09/19/17 0945  BP: 128/80 134/84  Pulse: 86 88  Resp: (!) 21   Temp: 98.1 F (36.7 C)   SpO2: 97% 100%   Wt Readings from Last 3 Encounters:  07/16/17 148 lb (67.1 kg)  05/31/17 154 lb (69.9 kg)  05/07/17 155 lb (70.3 kg)    General: Pleasant, cooperative, comfortable, slender AAM Head: No facial asymmetry or swelling.  No signs of head trauma. Eyes: No scleral icterus.  No conjunctival pallor.  Eyes do not  focus well.  Did not perform ophthalmologic testing. Ears: Not hard of hearing. Nose: No congestion or discharge Mouth: Tongue midline.  Oral mucosa moist and clear.  Good dentition. Neck: No JVD, no masses, no thyromegaly Lungs: Clear lungs bilaterally.  No cough, no dyspnea. Heart: RRR.  No MRG.  S1, S2 present.   Abdomen: Thin, soft.  Not tender.  No HSM, no masses, no thyromegaly, no bruits.  Active bowel sounds..   Rectal: Did not repeat rectal exam. Musc/Skeltl: No joint redness, swelling or  significant deformity. Extremities: AV graft on the right upper extremity.  No lower extremity edema. Neurologic: Oriented x3.  Historian.  Moves all 4 limbs, strength full.  No tremors. Skin: No rashes, no sores, no suspicious lesions. Tattoos: None observed. Nodes: Cervical or inguinal adenopathy. Psych: Pleasant, calm, cooperative.  Intake/Output from previous day: 04/03 0701 - 04/04 0700 In: 1365.8 [Blood:1305.8; IV Piggyback:60] Out: -  Intake/Output this shift: Total I/O In: -  Out: 600 [Urine:600]  LAB RESULTS: Recent Labs    09/18/17 2057 09/19/17 0607  WBC 12.2* 11.2*  HGB 4.6* 7.1*  HCT 13.1* 20.4*  PLT 127* 154   BMET Lab Results  Component Value Date   NA 135 09/18/2017   NA 135 05/31/2017   NA 137 02/21/2016   K 3.4 (L) 09/18/2017   K 4.0 05/31/2017   K 3.8 02/21/2016   CL 102 09/18/2017   CL 95 (L) 05/31/2017   CL 103 02/21/2016   CO2 19 (L) 09/18/2017   CO2 20 05/31/2017   CO2 22 02/21/2016   GLUCOSE 128 (H) 09/18/2017   GLUCOSE 89 05/31/2017   GLUCOSE 89 02/21/2016   BUN 153 (H) 09/18/2017   BUN 79 (HH) 05/31/2017   BUN 88 (H) 02/21/2016   CREATININE 4.83 (H) 09/18/2017   CREATININE 5.04 (H) 05/31/2017   CREATININE 3.84 (H) 02/21/2016   CALCIUM 9.2 09/18/2017   CALCIUM 9.1 05/31/2017   CALCIUM 8.6 (L) 02/21/2016   LFT Recent Labs    09/18/17 2140  PROT 6.9  ALBUMIN 3.7  AST 29  ALT 25  ALKPHOS 36*  BILITOT 1.3*  BILIDIR 0.2  IBILI 1.1*   PT/INR Lab Results  Component Value Date   INR 1.50 09/19/2017   INR 3.86 09/18/2017   INR 2.4 08/12/2017   Hepatitis Panel No results for input(s): HEPBSAG, HCVAB, HEPAIGM, HEPBIGM in the last 72 hours. C-Diff No components found for: CDIFF Lipase     Component Value Date/Time   LIPASE 79 (H) 09/19/2017 0716     RADIOLOGY STUDIES: US Abdomen Complete  Result Date: 09/19/2017 CLINICAL DATA:  Abdominal pain.  Vomiting blood. EXAM: ABDOMEN ULTRASOUND COMPLETE COMPARISON:  CT abdomen  and pelvis 02/07/2013 FINDINGS: Gallbladder: Cholelithiasis with multiple gallstones. Largest measures about 1.7 cm. Diffuse sludge also in the gallbladder. Borderline gallbladder wall thickness at 3.2 mm. Mild pericholecystic edema. Murphy's sign is negative. Common bile duct: Diameter: 8.8 mm common dilated. No obstructing stone or lesion identified but the distal bile duct is obscured by bowel gas. Liver: No focal lesion identified. Within normal limits in parenchymal echogenicity. Portal vein is patent on color Doppler imaging with normal direction of blood flow towards the liver. IVC: No abnormality visualized. Pancreas: Visualized portion unremarkable. Spleen: Spleen is not identified. Previous CT scan demonstrated an atrophic calcified spleen. Right Kidney: Length: 8.4 cm. Diffusely increased parenchymal echotexture consistent with medical renal disease. A subcentimeter parenchymal cysts is demonstrated. No hydronephrosis. Left Kidney: Length: 10.9 cm. Diffusely increased  parenchymal echotexture consistent with medical renal disease. Small cyst on the lower pole measuring 1.6 cm maximal diameter. No hydronephrosis. Abdominal aorta: No aneurysm visualized. Other findings: None. IMPRESSION: 1. Cholelithiasis and gallbladder sludge with mild pericholecystic edema. With negative Murphy's sign, cholecystitis is less likely. Dilated common bile duct. Cause not identified. 2. Increased parenchymal echotexture in the kidney suggesting chronic medical renal disease. No hydronephrosis. Electronically Signed   By: Lucienne Capers M.D.   On: 09/19/2017 05:35   Dg Chest Portable 1 View  Result Date: 09/18/2017 CLINICAL DATA:  Hematemesis EXAM: PORTABLE CHEST 1 VIEW COMPARISON:  12/24/2015 FINDINGS: Normal heart size and pulmonary vascularity. No focal airspace disease or consolidation in the lungs. No blunting of costophrenic angles. No pneumothorax. Mediastinal contours appear intact. Calcification of the aorta.  IMPRESSION: No evidence of active pulmonary disease.  Aortic atherosclerosis. Electronically Signed   By: Lucienne Capers M.D.   On: 09/18/2017 22:15      IMPRESSION:   *  Hemetemesis. R/o ulcer disease, Mallory-Weiss tear, AVMs, neoplasia.  *   Blood loss anemia in patient with chronic anemia due to sickle cell disease.  Symptoms improved following transfusion.  Currently receiving third and final unit PRBC.  Ferritin low at 22.    *    Chronic Coumadin for history of A. fib.  Currently sinus rhythm.  Supratherapeutic INR reversed with vitamin K and K Centra.  *   Moderate right abdominal pain.  Mild elevation of lipase and total bilirubin otherwise normal LFTs.  Gallbladder stones, sludge, mild wall edema, mild CBD dilatation on ultrasound.  *   Nonischemic cardiomyopathy.  EF 40-45% per echo 2 months ago.    *   Legally blind.  *   CKD, stage 5.  S/p right UE vascular fistula.  No on HD yet.        PLAN:     *   EGD, hopefully this afternoon.  CBC 5 PM and in AM.  BMET, hepatic fx profile, lipase in AM.   Continue Protonix 40 mg IV q 12.   DDAVP per pharmacy, consult order placed.     *  Does he need HIDA or MRCP to evaluate biliary abnormalities?     Azucena Freed  09/19/2017, 10:01 AM Phone [phone]   Attending physician's note   I have taken an interval history, reviewed the chart and examined the patient. I agree with the Advanced Practitioner's note, impression and recommendations.   62 year old pleasant male with sickle cell disease, end-stage renal disease, CHF, A. Fib on Coumadin admitted with hematemesis with hemoglobin 4.6 s/p 2 units of PRBCs to hemoglobin 7.2, had Vit K and K Centra to reverse coumadin. For EGD today. Contonue IV protonix  Carmell Austria, MD

## 2017-09-19 NOTE — ED Notes (Signed)
Pt stands at bedside to void to urinal.

## 2017-09-19 NOTE — Progress Notes (Signed)
MEDICATION RELATED CONSULT NOTE - INITIAL   Pharmacy Consult for DDAVP Indication: Uremic bleed  No Known Allergies  Patient Measurements:   Actual Body Weight: ~67 kg   Vital Signs: Temp: 97.6 F (36.4 C) (04/04 1206) Temp Source: Oral (04/04 1206) BP: 131/85 (04/04 1245) Pulse Rate: 81 (04/04 1245) Intake/Output from previous day: 04/03 0701 - 04/04 0700 In: 1365.8 [Blood:1305.8; IV Piggyback:60] Out: -  Intake/Output from this shift: Total I/O In: 750 [Blood:750] Out: 600 [Urine:600]  Labs: Recent Labs    09/18/17 2057 09/18/17 2140 09/19/17 0607 09/19/17 0716  WBC 12.2*  --  11.2*  --   HGB 4.6*  --  7.1*  --   HCT 13.1*  --  20.4*  --   PLT 127*  --  154  --   CREATININE 4.83*  --   --   --   MG  --   --   --  2.2  PHOS  --   --   --  3.3  ALBUMIN  --  3.7  --   --   PROT  --  6.9  --   --   AST  --  29  --   --   ALT  --  25  --   --   ALKPHOS  --  36*  --   --   BILITOT  --  1.3*  --   --   BILIDIR  --  0.2  --   --   IBILI  --  1.1*  --   --    CrCl cannot be calculated (Unknown ideal weight.).   Microbiology: No results found for this or any previous visit (from the past 720 hour(s)).  Medical History: Past Medical History:  Diagnosis Date  . Blood dyscrasia   . Blood transfusion    "I've had a bunch of them"  . CHF (congestive heart failure) ([place])    followed by hf clinic  . Chronic kidney disease (CKD), stage III (moderate) ([facility])   . Gout   . GLOVFIEP(329.5)    "probably weekly" (01/13/2014)  . Legally blind   . Shortness of breath dyspnea   . Sickle cell disease, type SS ([place])   . Swelling abdomen   . Swelling of extremity, left   . Swelling of extremity, right     Medications:   (Not in a hospital admission)  Assessment: 62 YOM with bloody vomitus and anemia. Pharmacy consulted to dose DDAVP. Patient has a history of sickle cell disease and is being transfused. BUN is elevated at 153. H/H 7.1/20.4, Plt 154k   Goal of  Therapy:  Bleeding cessation   Plan:  -DDAVP 20 mcg (0.3 mcg/kg) x 1 dose -Monitor for resolution for bleeding    Albertina Parr, PharmD., BCPS Clinical Pharmacist Clinical phone for 09/19/17 until 3:30pm: [phone]

## 2017-09-19 NOTE — Op Note (Signed)
[facility] Patient Name: Patrick [name] Procedure Date : 09/19/2017 MRN: [id number] Attending MD: Jackquline Denmark MD, MD Date of Birth: September 16, 1955 CSN: [id number] Age: 62 Admit Type: Inpatient Procedure:                Upper GI endoscopy Indications:              Suspected upper gastrointestinal bleeding Providers:                Jackquline Denmark MD, MD, Kingsley Plan, RN, Elspeth Cho Tech., Technician Referring MD:             [facility] Medicines:                Fentanyl 100 micrograms IV, Versed 6 mg IV Complications:            No immediate complications. Estimated Blood Loss:     Estimated blood loss: none. Procedure:                Pre-Anesthesia Assessment:                           - Prior to the procedure, a History and Physical                            was performed, and patient medications and                            allergies were reviewed. The patient is competent.                            The risks and benefits of the procedure and the                            sedation options and risks were discussed with the                            patient. All questions were answered and informed                            consent was obtained. Patient identification and                            proposed procedure were verified by the physician.                            Mental Status Examination: alert and oriented.                            Prophylactic Antibiotics: The patient does not                            require prophylactic antibiotics. Prior  Anticoagulants: The patient has taken Coumadin                            (warfarin), last dose was 3 days prior to                            procedure. ASA Grade Assessment: III - A patient                            with severe systemic disease. After reviewing the                            risks and benefits, the patient was deemed in                           satisfactory condition to undergo the procedure.                            The anesthesia plan was to use moderate sedation /                            analgesia (conscious sedation). Immediately prior                            to administration of medications, the patient was                            re-assessed for adequacy to receive sedatives. The                            heart rate, respiratory rate, oxygen saturations,                            blood pressure, adequacy of pulmonary ventilation,                            and response to care were monitored throughout the                            procedure. The physical status of the patient was                            re-assessed after the procedure.                           After obtaining informed consent, the endoscope was                            passed under direct vision. Throughout the                            procedure, the patient's blood pressure, pulse, and  oxygen saturations were monitored continuously. The                            EG-[id number] ([id number]) scope was introduced through the                            mouth, and advanced to the second part of duodenum.                            The upper GI endoscopy was accomplished without                            difficulty. The patient tolerated the procedure                            well. Scope In: Scope Out: Findings:      Esophagus: normal.      Stomach: Few erosions was found in the gastric antrum. This was biopsied       with a cold forceps for Helicobacter pylori testing using CLOtest. No       active bleeding.      Duodenum: normal. Impression:               Erosive gastritis. Biopsied. No active bleeding. Recommendation:           - Recommend CT scan andomen and pelvis for further                            evaluation of abdominal pain, GI bleed and mildly                            abnormal  lipase.                           - Resume Full liquid diet.                           - Avoid non-steroidal anti-inflammatory drugs.                           - Continue present medications.                           - Await pathology results. Procedure Code(s):        --- Professional ---                           [phone], Esophagogastroduodenoscopy, flexible,                            transoral; with biopsy, single or multiple Diagnosis Code(s):        --- Professional ---                           K29.60, Other gastritis without bleeding CPT copyright 2017 [employer]. All rights reserved. The codes documented in this report are preliminary and upon coder review may  be revised to meet current compliance requirements. Jackquline Denmark MD, MD 09/19/2017 2:40:20 PM This report has been signed electronically. Number of Addenda: 0

## 2017-09-19 NOTE — Progress Notes (Signed)
NURSING PROGRESS NOTE  Patrick [name] [id number] Admission Data: 09/19/2017 1:36 PM Attending Provider: Leeanne Rio, MD BFX:OVANVB, Marlena Clipper, MD Code Status: full    Patrick [name] is a 62 y.o. male patient admitted from ED:  -No acute distress noted.  -No complaints of shortness of breath.  -No complaints of chest pain.   Cardiac Monitoring: Cardiac monitor yields: atrial fibulation   bp- 127/80 HR- 87 spo2- 100% on room air   IV Fluids:  IV in place, occlusive dsg intact without redness, IV cath hand .   Allergies:  Patient has no known allergies.  Past Medical History:   has a past medical history of Blood dyscrasia, Blood transfusion, CHF (congestive heart failure) ([place]), Chronic kidney disease (CKD), stage III (moderate) ([place]), Gout, Headache(784.0), Legally blind, Shortness of breath dyspnea, Sickle cell disease, type SS ([place]), Swelling abdomen, Swelling of extremity, left, and Swelling of extremity, right.  Past Surgical History:   has a past surgical history that includes Cardiac catheterization (05/2011); Bascilic vein transposition (Right, 04/12/2014); left and right heart catheterization with coronary angiogram (N/A, 06/11/2011); and TEE without cardioversion (N/A, 12/27/2015).  Social History:   reports that he quit smoking about 21 months ago. His smoking use included cigarettes. He has a 10.25 pack-year smoking history. He has never used smokeless tobacco. He reports that he drinks about 1.8 oz of alcohol per week. He reports that he does not use drugs.   Patient/Family orientated to room. Information packet given to patient/family. Admission inpatient armband information verified with patient/family to include name and date of birth and placed on patient arm. Side rails up x 2, fall assessment and education completed with patient/family. Patient/family able to verbalize understanding of risk associated with falls and verbalized understanding to call for  assistance before getting out of bed. Call light within reach. Patient/family able to voice and demonstrate understanding of unit orientation instructions.    Will continue to evaluate and treat per MD orders.

## 2017-09-19 NOTE — ED Notes (Signed)
Attempted to call report

## 2017-09-20 ENCOUNTER — Encounter (HOSPITAL_COMMUNITY): Payer: Self-pay | Admitting: Gastroenterology

## 2017-09-20 DIAGNOSIS — R1084 Generalized abdominal pain: Secondary | ICD-10-CM

## 2017-09-20 DIAGNOSIS — R109 Unspecified abdominal pain: Secondary | ICD-10-CM

## 2017-09-20 LAB — BPAM RBC
Blood Product Expiration Date: 201904122359
Blood Product Expiration Date: 201904212359
Blood Product Expiration Date: 201904232359
ISSUE DATE / TIME: 201904032246
ISSUE DATE / TIME: 201904040132
ISSUE DATE / TIME: 201904040841
Unit Type and Rh: 5100
Unit Type and Rh: 5100
Unit Type and Rh: 600

## 2017-09-20 LAB — TYPE AND SCREEN
ABO/RH(D): AB POS
Antibody Screen: NEGATIVE
Unit division: 0
Unit division: 0
Unit division: 0

## 2017-09-20 LAB — BASIC METABOLIC PANEL
Anion gap: 13 (ref 5–15)
BUN: 121 mg/dL — ABNORMAL HIGH (ref 6–20)
CO2: 22 mmol/L (ref 22–32)
Calcium: 8.7 mg/dL — ABNORMAL LOW (ref 8.9–10.3)
Chloride: 100 mmol/L — ABNORMAL LOW (ref 101–111)
Creatinine, Ser: 4.48 mg/dL — ABNORMAL HIGH (ref 0.61–1.24)
GFR calc Af Amer: 15 mL/min — ABNORMAL LOW (ref >60)
GFR calc non Af Amer: 13 mL/min — ABNORMAL LOW (ref >60)
Glucose, Bld: 110 mg/dL — ABNORMAL HIGH (ref 65–99)
Potassium: 3.2 mmol/L — ABNORMAL LOW (ref 3.5–5.1)
Sodium: 135 mmol/L (ref 135–145)

## 2017-09-20 LAB — HEPATIC FUNCTION PANEL
ALT: 19 U/L (ref 17–63)
AST: 25 U/L (ref 15–41)
Albumin: 3.4 g/dL — ABNORMAL LOW (ref 3.5–5.0)
Alkaline Phosphatase: 35 U/L — ABNORMAL LOW (ref 38–126)
Bilirubin, Direct: 0.2 mg/dL (ref 0.1–0.5)
Indirect Bilirubin: 1.4 mg/dL — ABNORMAL HIGH (ref 0.3–0.9)
Total Bilirubin: 1.6 mg/dL — ABNORMAL HIGH (ref 0.3–1.2)
Total Protein: 6.5 g/dL (ref 6.5–8.1)

## 2017-09-20 LAB — CBC
HCT: 22.4 % — ABNORMAL LOW (ref 39.0–52.0)
Hemoglobin: 8 g/dL — ABNORMAL LOW (ref 13.0–17.0)
MCH: 32.8 pg (ref 26.0–34.0)
MCHC: 35.7 g/dL (ref 30.0–36.0)
MCV: 91.8 fL (ref 78.0–100.0)
Platelets: 140 10*3/uL — ABNORMAL LOW (ref 150–400)
RBC: 2.44 MIL/uL — ABNORMAL LOW (ref 4.22–5.81)
RDW: 21.5 % — ABNORMAL HIGH (ref 11.5–15.5)
WBC: 8.3 10*3/uL (ref 4.0–10.5)

## 2017-09-20 LAB — RETICULOCYTES
RBC.: 2.44 MIL/uL — ABNORMAL LOW (ref 4.22–5.81)
Retic Count, Absolute: 187.9 10*3/uL — ABNORMAL HIGH (ref 19.0–186.0)
Retic Ct Pct: 7.7 % — ABNORMAL HIGH (ref 0.4–3.1)

## 2017-09-20 LAB — LIPASE, BLOOD: Lipase: 118 U/L — ABNORMAL HIGH (ref 11–51)

## 2017-09-20 LAB — CLOTEST (H. PYLORI), BIOPSY: Helicobacter screen: NEGATIVE

## 2017-09-20 LAB — TROPONIN I: Troponin I: 0.03 ng/mL (ref <0.03)

## 2017-09-20 MED ORDER — POTASSIUM CHLORIDE CRYS ER 20 MEQ PO TBCR
20.0000 meq | EXTENDED_RELEASE_TABLET | Freq: Two times a day (BID) | ORAL | Status: DC
  Administered 2017-09-20: 20 meq via ORAL
  Filled 2017-09-20: qty 1

## 2017-09-20 MED ORDER — PANTOPRAZOLE SODIUM 40 MG PO TBEC
40.0000 mg | DELAYED_RELEASE_TABLET | Freq: Every day | ORAL | Status: DC
  Administered 2017-09-20 – 2017-09-22 (×3): 40 mg via ORAL
  Filled 2017-09-20 (×3): qty 1

## 2017-09-20 NOTE — Progress Notes (Signed)
   09/20/17 1100  Clinical Encounter Type  Visited With Patient;Patient and family together  Follow-up with Pt for prayer request with [employer] from [place]. Pt. Said that some women from [place] came by and prayed with him and he was fine now. He no longer need a Forensic psychologist.  Matthew Folks

## 2017-09-20 NOTE — Progress Notes (Signed)
Family Medicine Teaching Service Daily Progress Note Intern Pager: [phone]  Patient name: Patrick [name] Medical record number: [id number] Date of birth: December 11, 1955 Age: 62 y.o. Gender: male  Primary Care Provider: [name], MD Consultants: Gastroenterology Code Status: Full (confirmed on admission)  Pt Overview and Major Events to Date:  Patrick [name] is a 62 y.o. male presenting with 1 day of hematemesis. PMH is significant for sickle cell anemia, HFmrEF, CKD V, visually impaired, Atrial fibrillation, Gout  4/3 - s/p 3 pRBC 4/4 - EGD Assessment and Plan:  Upper GI Bleed, improved EGD showed erosive gastritis. Biopsied with path pending. No active bleeding. Per GI, switch to oral protonix 40 mg qd, ready for discharge. Urease negative -biopsy path pending -Follow up outpatient GI for colonoscopy.   Symptomatic Cholelithiasis, worsening CT abdomen showed cholelithiasis, but no acute processes. Lipase slightly increased from 79 > 118. Abdominal pain not worse with nausea.  GI advanced diet to clear. -GI recs appreciated -Telemetry and continuous pulse ox -N.p.o. General surgery consulted, appreciate recommendations  ABLA/Sickle Cell Anemia, improved Hg stable on 8 on 4/6. Retic % 9.9, but unsure of accuracy in recent of transfusion. Complaints of mild abdominal pain. On hydroxyurea 500 mg daily.  -monitor CBC -cont hydroxyurea and folate  Atrial fibrillation, rate controlled On coreg GI has ok'd restarting warfarin as Hg stable and no signs of active bleedingin the setting of bleeding, discussed risk and benefits with patient regarding anticoagulation (Stroke vs major bleeding event). Will not restart anticoagulation during this hospitalization. Patient wishes to discuss with cardiologist on discharge  -Continue Coreg -Telemetry   HFmrEF Last echo 07/2017. EF 40-45% G1DD.  No evidence of volume. At home Takes Coreg 12.5 twice daily, hydralazine 50 mg  twice daily, torsemide 100 mg daily, Imdur 60 mg daily -currently holding IMDUR, hydralazine and Torsemide in setting of low normal BP -Daily weights and strict I's and O's  CKD V,stable Cr 4.8> 4.33.  -Daily BMP to follow creatinine -Strict I's and O's  Hypokalemia, resolved  -Continue to monitor  FEN/GI: N.p.o. Prophylaxis: SCDs  Disposition: Inpatient for surgery consult  Subjective:  Patient feels better. Says abdomen hurt, but improved. No complaint of dizziness, sob, cp.   Objective: Temp:  [97.6 F (36.4 C)-97.7 F (36.5 C)] 97.7 F (36.5 C) (04/05 1344) Pulse Rate:  [67-102] 83 (04/05 2222) Resp:  [14-16] 16 (04/05 2222) BP: (90-139)/(58-94) 116/90 (04/05 2222) SpO2:  [98 %-100 %] 98 % (04/05 2222) Weight:  [140 lb 3.4 oz (63.6 kg)] 140 lb 3.4 oz (63.6 kg) (04/05 0452) Physical Exam: Gen: NAD, resting comfortably CV: RRR with no murmurs appreciated Pulm: NWOB, CTAB with no crackles, wheezes, or rhonchi GI: Right upper quadrant tenderness, no rebound or guarding  MSK: no edema, cyanosis, or clubbing noted Skin: warm, dry Neuro: grossly normal, moves all extremities Psych: Normal affect and thought content  Laboratory: Recent Labs  Lab 09/19/17 0607 09/19/17 1600 09/20/17 0701  WBC 11.2* 9.7 8.3  HGB 7.1* 8.5* 8.0*  HCT 20.4* 25.0* 22.4*  PLT 154 144* 140*   Recent Labs  Lab 09/18/17 2057 09/18/17 2140 09/20/17 0701  NA 135  --  135  K 3.4*  --  3.2*  CL 102  --  100*  CO2 19*  --  22  BUN 153*  --  121*  CREATININE 4.83*  --  4.48*  CALCIUM 9.2  --  8.7*  PROT  --  6.9 6.5  BILITOT  --  1.3* 1.6*  ALKPHOS  --  36* 35*  ALT  --  25 19  AST  --  29 25  GLUCOSE 128*  --  110*   Imaging/Diagnostic Tests: Ct Abdomen Pelvis Wo Contrast  Result Date: 09/19/2017 CLINICAL DATA:  Hematemesis.  History of sickle cell disease EXAM: CT ABDOMEN AND PELVIS WITHOUT CONTRAST TECHNIQUE: Multidetector CT imaging of the abdomen and pelvis was performed  following the standard protocol without IV contrast. COMPARISON:  02/07/2013 FINDINGS: Lower chest: Heart is enlarged. Hepatobiliary: No focal abnormality in the liver on this study without intravenous contrast. Calcified gallstone measures up to 17 mm. No intrahepatic or extrahepatic biliary dilation. Pancreas: No focal mass lesion. No dilatation of the main duct. No intraparenchymal cyst. No peripancreatic edema. Spleen: Small calcified spleen compatible with auto splenectomy. Adrenals/Urinary Tract: No adrenal nodule or mass. Similar 14 mm low-density lesion interpolar left kidney, likely a cyst. Mild fullness of both ureters noted, likely related to the prominent bladder distention. Stomach/Bowel: Stomach is nondistended. No gastric wall thickening. No evidence of outlet obstruction. Duodenum is normally positioned as is the ligament of Treitz. No small bowel wall thickening. No small bowel dilatation. The terminal ileum is normal. The appendix is normal. No gross colonic mass. No colonic wall thickening. No substantial diverticular change. Vascular/Lymphatic: There is abdominal aortic atherosclerosis without aneurysm. There is no gastrohepatic or hepatoduodenal ligament lymphadenopathy. No intraperitoneal or retroperitoneal lymphadenopathy. No pelvic sidewall lymphadenopathy. Reproductive: The prostate gland and seminal vesicles have normal imaging features. Other: No intraperitoneal free fluid. Musculoskeletal: Bone windows reveal no worrisome lytic or sclerotic osseous lesions. Heterogeneous mineralization is similar to prior and compatible with the reported history of sickle cell disease. IMPRESSION: 1. No acute findings in the abdomen or pelvis. 2. Cholelithiasis. 3. Auto splenectomy. 4. Mild fullness of the ureters bilaterally likely related to marked bladder distention. Electronically Signed   By: Misty Stanley M.D.   On: 09/19/2017 20:25   Bonnita Hollow, MD 09/20/2017, 11:11 PM PGY-1, [place] Intern pager: [phone], text pages welcome

## 2017-09-20 NOTE — Care Management Note (Signed)
Case Management Note  Patient Details  Name: NIKOLAJ GERAGHTY MRN: [id number] Date of Birth: 05-13-1956  Subjective/Objective:     Upper GI Bleed              Action/Plan: NCM spoke to pt at bedside. Pt has states he can afford his medications. No NCM needs identified.    Expected Discharge Date:                  Expected Discharge Plan:  Home/Self Care  In-House Referral:  NA  Discharge planning Services  CM Consult  Post Acute Care Choice:  NA Choice offered to:  NA  DME Arranged:  N/A DME Agency:  NA  HH Arranged:  NA HH Agency:  NA  Status of Service:  Completed, signed off  If discussed at [place] of Stay Meetings, dates discussed:    Additional Comments:  Erenest Rasher, RN 09/20/2017, 3:35 PM

## 2017-09-20 NOTE — H&P (View-Only) (Signed)
Daily Rounding Note  09/20/2017, 9:19 AM  LOS: 1 day   SUBJECTIVE:        No further N/V, no abd pain, no diarrhea, no weakness.  Fells well.  Hungry. Tolerating clears and diet advanced this AM to full liquids.     OBJECTIVE:         Vital signs in last 24 hours:    Temp:  [97.4 F (36.3 C)-97.8 F (36.6 C)] 97.6 F (36.4 C) (04/05 0453) Pulse Rate:  [67-112] 71 (04/05 0821) Resp:  [10-24] 15 (04/05 0422) BP: (77-153)/(56-106) 103/71 (04/05 0821) SpO2:  [98 %-100 %] 100 % (04/05 0821) Weight:  [140 lb 3.4 oz (63.6 kg)-148 lb (67.1 kg)] 140 lb 3.4 oz (63.6 kg) (04/05 0452) Last BM Date: 09/18/17 Filed Weights   09/19/17 1332 09/20/17 0452  Weight: 148 lb (67.1 kg) 140 lb 3.4 oz (63.6 kg)   General: pleasant, comfortable, not ill looking   Heart: RRR Chest: clear bil.  No labored breathing or cough Abdomen: soft, NT, ND.  Active BS.  ND  Extremities: no CCE.  AVG in right arm Neuro/Psych:  Pleasant, alert, oriented x 3.  No limb weakness or tremor.    Intake/Output from previous day: 04/04 0701 - 04/05 0700 In: 750 [Blood:750] Out: 1950 [Urine:1950]  Intake/Output this shift: No intake/output data recorded.  Lab Results: Recent Labs    09/19/17 0607 09/19/17 1600 09/20/17 0701  WBC 11.2* 9.7 PENDING  HGB 7.1* 8.5* 8.0*  HCT 20.4* 25.0* 22.4*  PLT 154 144* 140*   BMET Recent Labs    09/18/17 2057 09/20/17 0701  NA 135 135  K 3.4* 3.2*  CL 102 100*  CO2 19* 22  GLUCOSE 128* 110*  BUN 153* 121*  CREATININE 4.83* 4.48*  CALCIUM 9.2 8.7*   LFT Recent Labs    09/18/17 2140 09/20/17 0701  PROT 6.9 6.5  ALBUMIN 3.7 3.4*  AST 29 25  ALT 25 19  ALKPHOS 36* 35*  BILITOT 1.3* 1.6*  BILIDIR 0.2 0.2  IBILI 1.1* 1.4*   PT/INR Recent Labs    09/18/17 2140 09/19/17 0608  LABPROT 37.6* 18.0*  INR 3.86 1.50   Hepatitis Panel No results for input(s): HEPBSAG, HCVAB, HEPAIGM, HEPBIGM in the  last 72 hours.  Studies/Results: Ct Abdomen Pelvis Wo Contrast  Result Date: 09/19/2017 CLINICAL DATA:  Hematemesis.  History of sickle cell disease EXAM: CT ABDOMEN AND PELVIS WITHOUT CONTRAST TECHNIQUE: Multidetector CT imaging of the abdomen and pelvis was performed following the standard protocol without IV contrast. COMPARISON:  02/07/2013 FINDINGS: Lower chest: Heart is enlarged. Hepatobiliary: No focal abnormality in the liver on this study without intravenous contrast. Calcified gallstone measures up to 17 mm. No intrahepatic or extrahepatic biliary dilation. Pancreas: No focal mass lesion. No dilatation of the main duct. No intraparenchymal cyst. No peripancreatic edema. Spleen: Small calcified spleen compatible with auto splenectomy. Adrenals/Urinary Tract: No adrenal nodule or mass. Similar 14 mm low-density lesion interpolar left kidney, likely a cyst. Mild fullness of both ureters noted, likely related to the prominent bladder distention. Stomach/Bowel: Stomach is nondistended. No gastric wall thickening. No evidence of outlet obstruction. Duodenum is normally positioned as is the ligament of Treitz. No small bowel wall thickening. No small bowel dilatation. The terminal ileum is normal. The appendix is normal. No gross colonic mass. No colonic wall thickening. No substantial diverticular change. Vascular/Lymphatic: There is abdominal aortic atherosclerosis without aneurysm. There is no  gastrohepatic or hepatoduodenal ligament lymphadenopathy. No intraperitoneal or retroperitoneal lymphadenopathy. No pelvic sidewall lymphadenopathy. Reproductive: The prostate gland and seminal vesicles have normal imaging features. Other: No intraperitoneal free fluid. Musculoskeletal: Bone windows reveal no worrisome lytic or sclerotic osseous lesions. Heterogeneous mineralization is similar to prior and compatible with the reported history of sickle cell disease. IMPRESSION: 1. No acute findings in the abdomen or  pelvis. 2. Cholelithiasis. 3. Auto splenectomy. 4. Mild fullness of the ureters bilaterally likely related to marked bladder distention. Electronically Signed   By: Misty Stanley M.D.   On: 09/19/2017 20:25   US Abdomen Complete  Result Date: 09/19/2017 CLINICAL DATA:  Abdominal pain.  Vomiting blood. EXAM: ABDOMEN ULTRASOUND COMPLETE COMPARISON:  CT abdomen and pelvis 02/07/2013 FINDINGS: Gallbladder: Cholelithiasis with multiple gallstones. Largest measures about 1.7 cm. Diffuse sludge also in the gallbladder. Borderline gallbladder wall thickness at 3.2 mm. Mild pericholecystic edema. Murphy's sign is negative. Common bile duct: Diameter: 8.8 mm common dilated. No obstructing stone or lesion identified but the distal bile duct is obscured by bowel gas. Liver: No focal lesion identified. Within normal limits in parenchymal echogenicity. Portal vein is patent on color Doppler imaging with normal direction of blood flow towards the liver. IVC: No abnormality visualized. Pancreas: Visualized portion unremarkable. Spleen: Spleen is not identified. Previous CT scan demonstrated an atrophic calcified spleen. Right Kidney: Length: 8.4 cm. Diffusely increased parenchymal echotexture consistent with medical renal disease. A subcentimeter parenchymal cysts is demonstrated. No hydronephrosis. Left Kidney: Length: 10.9 cm. Diffusely increased parenchymal echotexture consistent with medical renal disease. Small cyst on the lower pole measuring 1.6 cm maximal diameter. No hydronephrosis. Abdominal aorta: No aneurysm visualized. Other findings: None. IMPRESSION: 1. Cholelithiasis and gallbladder sludge with mild pericholecystic edema. With negative Murphy's sign, cholecystitis is less likely. Dilated common bile duct. Cause not identified. 2. Increased parenchymal echotexture in the kidney suggesting chronic medical renal disease. No hydronephrosis. Electronically Signed   By: Lucienne Capers M.D.   On: 09/19/2017 05:35    Dg Chest Portable 1 View  Result Date: 09/18/2017 CLINICAL DATA:  Hematemesis EXAM: PORTABLE CHEST 1 VIEW COMPARISON:  12/24/2015 FINDINGS: Normal heart size and pulmonary vascularity. No focal airspace disease or consolidation in the lungs. No blunting of costophrenic angles. No pneumothorax. Mediastinal contours appear intact. Calcification of the aorta. IMPRESSION: No evidence of active pulmonary disease.  Aortic atherosclerosis. Electronically Signed   By: Lucienne Capers M.D.   On: 09/18/2017 22:15   Scheduled Meds: . allopurinol  100 mg Oral BID  . calcitRIOL  0.25 mcg Oral Daily  . carvedilol  12.5 mg Oral BID WC  . folic acid  1 mg Oral Daily  . hydroxyurea  500 mg Oral Daily  . lidocaine  1 patch Transdermal Q24H  . multivitamin with minerals  1 tablet Oral Daily  . ondansetron (ZOFRAN) IV  4 mg Intravenous Once  . pantoprazole (PROTONIX) IV  40 mg Intravenous Once  . pantoprazole (PROTONIX) IV  40 mg Intravenous Q12H  . potassium chloride  20 mEq Oral BID   Continuous Infusions: PRN Meds:.acetaminophen **OR** acetaminophen, gi cocktail, ondansetron (ZOFRAN) IV   ASSESMENT:   *   Hematemesis.  Resolved, not recurrent.  Erosive gastritis on EGD 4/4, biopsy path pending.  On Protonix 40 IV BID  *  Acute on chronic blood loss anemia in SS.  Improved post 3 U PRBCs.  Ferritin 22.    *   Thrombocytopenia, non-critical, improved.    *  Right abd  pain with mild rise in Lipase and t bili.  GB stones, sludge mild GB wall edema, mild CBD dilatation on [place].  CT with cholelithiasis, auto splenectomy.  Note resident's consideration for outpt surgical follow up.    *  Non ischemic CM.   afib RVR.  On chronic Coumadin.  INR 4.6 reversed with K centra and Vit K.    *   Legally blind.    *  Stage 5 CKD.     PLAN   *  Switch to oral PPI, 1 x daily.  Await gastric biopsies.   From GI view, ok to discharge home.  Will arrange GI fup as he needs screening colonoscopy given age 82.   Note that the pt relies on [facility] transport service to go to MD visits and [facility] does not transport to [facility] ED where Dr Steve Rattler clinic will be located.   ? When to restart Coumadin or other AC?Marland Kitchen    GI office will contact pt with follow up appointments.   Azucena Freed  09/20/2017, 9:19 AM Phone [phone]   Attending physician's note   I have taken an interval history, reviewed the chart and examined the patient. I agree with the Advanced Practitioner's note.  62 year old with sickle cell disease, stage IV renal disease, CHF, A. Fib on Coumadin admitted with hematemesis with hemoglobin 4.6 s/p 2 units of PRBCs to hemoglobin 7.2, had Vit K and K Centra to reverse coumadin.  EGD showed erosive gastritis.  Ultrasound and CT scan of the abdomen and pelvis was unremarkable except for cholelithiasis.  There has been no further bleeding.  He would need colonoscopy as an outpatient. (Patient not keen to stay here over the weekend for colonoscopy on Monday -Dr. Benson Norway on call over the weekend) continue PPIs.  Can start Coumadin with close monitoring of INR and hemoglobin.  Carmell Austria, MD

## 2017-09-20 NOTE — Progress Notes (Addendum)
Family Medicine Teaching Service Daily Progress Note Intern Pager: [phone]  Patient name: Patrick [name] Medical record number: [id number] Date of birth: 02/20/1956 Age: 62 y.o. Gender: male  Primary Care Provider: [name], MD Consultants: Gastroenterology Code Status: Full (confirmed on admission)  Pt Overview and Major Events to Date:  Patrick [name] is a 62 y.o. male presenting with 1 day of hematemesis. PMH is significant for sickle cell anemia, HFmrEF, CKD V, visually impaired, Atrial fibrillation, Gout  4/3 - s/p 3 pRBC 4/4 - EGD Assessment and Plan:  Upper GI Bleed, improved EGD showed "Erosive gastritis. Biopsied. No active bleeding." Biopsies taken, pathology pending. Abdominal CT obtained due to abdominal pain and abnormal lipase. CT abdomen showed cholelithiasis, but no acute processes. Lipase slightly increased from 79 > 118. Ddx from GI pain GERD vs. symptomatic cholelithiasis. GI advanced diet to clear. WBC has normalized.  -GI recs appreciated -ADAT to clear -IV Protonix -Telemetry and continuous pulse ox -Consider outpatient surgical consult for cholethiasis  ABLA/Sickle Cell Anemia, improved Hg improved from 4.6 on admit> 8.5 s/p transfusion pRBCx3 > 8.0 on 4/5. Retic % 7.7, but unsure of accuracy in setting of transfusion. Complaints of mild abdominal pain. Likely due to GI processes listed above, does not appear to be in active crisis. On hydroxyurea 500 mg daily.  -monitor CBC -cont hydroxyurea and folate  Atrial fibrillation with RVR, improved EKG now still shows A fib, but is now rate controlled. Patient  Chads vas 2 score of 3. HASBLED 3.   -Hold warfarin in the setting of bleeding, discussed risk and benefits with patient regarding anticoagulation (Stroke vs major bleeding event). Will not restart anticoagulation during this hospitalization. Patient wishes to  -Continue Coreg -Telemetry   HFmrEF Last echo 07/2017. EF 40-45% G1DD.   No evidence of volume. At home Takes Coreg 12.5 twice daily, hydralazine 50 mg twice daily, torsemide 100 mg daily, Imdur 60 mg daily -hold IMDUR, hydralazine and Torsemide in setting of low normal BP -Daily weights and strict I's and O's  CKD V,stable Cr 4.8> 4.48.  -Daily BMP to follow creatinine -Strict I's and O's  Hypokalemia 3.2 on 4/5.  - mild oral repletion in setting of CKD   FEN/GI: Full liquid Prophylaxis: SCDs  Disposition: Inpatient management of GI bleed  Subjective:  Patient feels better. Says abdomen hurt, but improved. No complaint of dizziness, sob, cp.   Objective: Temp:  [97.4 F (36.3 C)-98.1 F (36.7 C)] 97.6 F (36.4 C) (04/05 0453) Pulse Rate:  [67-112] 71 (04/05 0821) Resp:  [10-24] 15 (04/05 0422) BP: (77-153)/(56-106) 103/71 (04/05 0821) SpO2:  [97 %-100 %] 100 % (04/05 0821) Weight:  [140 lb 3.4 oz (63.6 kg)-148 lb (67.1 kg)] 140 lb 3.4 oz (63.6 kg) (04/05 0452) Physical Exam: Gen: NAD, resting comfortably CV: RRR with no murmurs appreciated Pulm: NWOB, CTAB with no crackles, wheezes, or rhonchi GI: mild right UQ tenderness Soft, Nontender, Nondistended. MSK: no edema, cyanosis, or clubbing noted Skin: warm, dry Neuro: grossly normal, moves all extremities Psych: Normal affect and thought content  Laboratory: Recent Labs  Lab 09/19/17 0607 09/19/17 1600 09/20/17 0701  WBC 11.2* 9.7 PENDING  HGB 7.1* 8.5* 8.0*  HCT 20.4* 25.0* 22.4*  PLT 154 144* 140*   Recent Labs  Lab 09/18/17 2057 09/18/17 2140 09/20/17 0701  NA 135  --  135  K 3.4*  --  3.2*  CL 102  --  100*  CO2 19*  --  22  BUN 153*  --  121*  CREATININE 4.83*  --  4.48*  CALCIUM 9.2  --  8.7*  PROT  --  6.9 6.5  BILITOT  --  1.3* 1.6*  ALKPHOS  --  36* 35*  ALT  --  25 19  AST  --  29 25  GLUCOSE 128*  --  110*   Imaging/Diagnostic Tests: Ct Abdomen Pelvis Wo Contrast  Result Date: 09/19/2017 CLINICAL DATA:  Hematemesis.  History of sickle cell disease EXAM:  CT ABDOMEN AND PELVIS WITHOUT CONTRAST TECHNIQUE: Multidetector CT imaging of the abdomen and pelvis was performed following the standard protocol without IV contrast. COMPARISON:  02/07/2013 FINDINGS: Lower chest: Heart is enlarged. Hepatobiliary: No focal abnormality in the liver on this study without intravenous contrast. Calcified gallstone measures up to 17 mm. No intrahepatic or extrahepatic biliary dilation. Pancreas: No focal mass lesion. No dilatation of the main duct. No intraparenchymal cyst. No peripancreatic edema. Spleen: Small calcified spleen compatible with auto splenectomy. Adrenals/Urinary Tract: No adrenal nodule or mass. Similar 14 mm low-density lesion interpolar left kidney, likely a cyst. Mild fullness of both ureters noted, likely related to the prominent bladder distention. Stomach/Bowel: Stomach is nondistended. No gastric wall thickening. No evidence of outlet obstruction. Duodenum is normally positioned as is the ligament of Treitz. No small bowel wall thickening. No small bowel dilatation. The terminal ileum is normal. The appendix is normal. No gross colonic mass. No colonic wall thickening. No substantial diverticular change. Vascular/Lymphatic: There is abdominal aortic atherosclerosis without aneurysm. There is no gastrohepatic or hepatoduodenal ligament lymphadenopathy. No intraperitoneal or retroperitoneal lymphadenopathy. No pelvic sidewall lymphadenopathy. Reproductive: The prostate gland and seminal vesicles have normal imaging features. Other: No intraperitoneal free fluid. Musculoskeletal: Bone windows reveal no worrisome lytic or sclerotic osseous lesions. Heterogeneous mineralization is similar to prior and compatible with the reported history of sickle cell disease. IMPRESSION: 1. No acute findings in the abdomen or pelvis. 2. Cholelithiasis. 3. Auto splenectomy. 4. Mild fullness of the ureters bilaterally likely related to marked bladder distention. Electronically Signed    By: Misty Stanley M.D.   On: 09/19/2017 20:25   Bonnita Hollow, MD 09/20/2017, 8:51 AM PGY-1, [place] Intern pager: [phone], text pages welcome

## 2017-09-20 NOTE — Progress Notes (Addendum)
Daily Rounding Note  09/20/2017, 9:19 AM  LOS: 1 day   SUBJECTIVE:        No further N/V, no abd pain, no diarrhea, no weakness.  Fells well.  Hungry. Tolerating clears and diet advanced this AM to full liquids.     OBJECTIVE:         Vital signs in last 24 hours:    Temp:  [97.4 F (36.3 C)-97.8 F (36.6 C)] 97.6 F (36.4 C) (04/05 0453) Pulse Rate:  [67-112] 71 (04/05 0821) Resp:  [10-24] 15 (04/05 0422) BP: (77-153)/(56-106) 103/71 (04/05 0821) SpO2:  [98 %-100 %] 100 % (04/05 0821) Weight:  [140 lb 3.4 oz (63.6 kg)-148 lb (67.1 kg)] 140 lb 3.4 oz (63.6 kg) (04/05 0452) Last BM Date: 09/18/17 Filed Weights   09/19/17 1332 09/20/17 0452  Weight: 148 lb (67.1 kg) 140 lb 3.4 oz (63.6 kg)   General: pleasant, comfortable, not ill looking   Heart: RRR Chest: clear bil.  No labored breathing or cough Abdomen: soft, NT, ND.  Active BS.  ND  Extremities: no CCE.  AVG in right arm Neuro/Psych:  Pleasant, alert, oriented x 3.  No limb weakness or tremor.    Intake/Output from previous day: 04/04 0701 - 04/05 0700 In: 750 [Blood:750] Out: 1950 [Urine:1950]  Intake/Output this shift: No intake/output data recorded.  Lab Results: Recent Labs    09/19/17 0607 09/19/17 1600 09/20/17 0701  WBC 11.2* 9.7 PENDING  HGB 7.1* 8.5* 8.0*  HCT 20.4* 25.0* 22.4*  PLT 154 144* 140*   BMET Recent Labs    09/18/17 2057 09/20/17 0701  NA 135 135  K 3.4* 3.2*  CL 102 100*  CO2 19* 22  GLUCOSE 128* 110*  BUN 153* 121*  CREATININE 4.83* 4.48*  CALCIUM 9.2 8.7*   LFT Recent Labs    09/18/17 2140 09/20/17 0701  PROT 6.9 6.5  ALBUMIN 3.7 3.4*  AST 29 25  ALT 25 19  ALKPHOS 36* 35*  BILITOT 1.3* 1.6*  BILIDIR 0.2 0.2  IBILI 1.1* 1.4*   PT/INR Recent Labs    09/18/17 2140 09/19/17 0608  LABPROT 37.6* 18.0*  INR 3.86 1.50   Hepatitis Panel No results for input(s): HEPBSAG, HCVAB, HEPAIGM, HEPBIGM in the  last 72 hours.  Studies/Results: Ct Abdomen Pelvis Wo Contrast  Result Date: 09/19/2017 CLINICAL DATA:  Hematemesis.  History of sickle cell disease EXAM: CT ABDOMEN AND PELVIS WITHOUT CONTRAST TECHNIQUE: Multidetector CT imaging of the abdomen and pelvis was performed following the standard protocol without IV contrast. COMPARISON:  02/07/2013 FINDINGS: Lower chest: Heart is enlarged. Hepatobiliary: No focal abnormality in the liver on this study without intravenous contrast. Calcified gallstone measures up to 17 mm. No intrahepatic or extrahepatic biliary dilation. Pancreas: No focal mass lesion. No dilatation of the main duct. No intraparenchymal cyst. No peripancreatic edema. Spleen: Small calcified spleen compatible with auto splenectomy. Adrenals/Urinary Tract: No adrenal nodule or mass. Similar 14 mm low-density lesion interpolar left kidney, likely a cyst. Mild fullness of both ureters noted, likely related to the prominent bladder distention. Stomach/Bowel: Stomach is nondistended. No gastric wall thickening. No evidence of outlet obstruction. Duodenum is normally positioned as is the ligament of Treitz. No small bowel wall thickening. No small bowel dilatation. The terminal ileum is normal. The appendix is normal. No gross colonic mass. No colonic wall thickening. No substantial diverticular change. Vascular/Lymphatic: There is abdominal aortic atherosclerosis without aneurysm. There is no  gastrohepatic or hepatoduodenal ligament lymphadenopathy. No intraperitoneal or retroperitoneal lymphadenopathy. No pelvic sidewall lymphadenopathy. Reproductive: The prostate gland and seminal vesicles have normal imaging features. Other: No intraperitoneal free fluid. Musculoskeletal: Bone windows reveal no worrisome lytic or sclerotic osseous lesions. Heterogeneous mineralization is similar to prior and compatible with the reported history of sickle cell disease. IMPRESSION: 1. No acute findings in the abdomen or  pelvis. 2. Cholelithiasis. 3. Auto splenectomy. 4. Mild fullness of the ureters bilaterally likely related to marked bladder distention. Electronically Signed   By: Misty Stanley M.D.   On: 09/19/2017 20:25   US Abdomen Complete  Result Date: 09/19/2017 CLINICAL DATA:  Abdominal pain.  Vomiting blood. EXAM: ABDOMEN ULTRASOUND COMPLETE COMPARISON:  CT abdomen and pelvis 02/07/2013 FINDINGS: Gallbladder: Cholelithiasis with multiple gallstones. Largest measures about 1.7 cm. Diffuse sludge also in the gallbladder. Borderline gallbladder wall thickness at 3.2 mm. Mild pericholecystic edema. Murphy's sign is negative. Common bile duct: Diameter: 8.8 mm common dilated. No obstructing stone or lesion identified but the distal bile duct is obscured by bowel gas. Liver: No focal lesion identified. Within normal limits in parenchymal echogenicity. Portal vein is patent on color Doppler imaging with normal direction of blood flow towards the liver. IVC: No abnormality visualized. Pancreas: Visualized portion unremarkable. Spleen: Spleen is not identified. Previous CT scan demonstrated an atrophic calcified spleen. Right Kidney: Length: 8.4 cm. Diffusely increased parenchymal echotexture consistent with medical renal disease. A subcentimeter parenchymal cysts is demonstrated. No hydronephrosis. Left Kidney: Length: 10.9 cm. Diffusely increased parenchymal echotexture consistent with medical renal disease. Small cyst on the lower pole measuring 1.6 cm maximal diameter. No hydronephrosis. Abdominal aorta: No aneurysm visualized. Other findings: None. IMPRESSION: 1. Cholelithiasis and gallbladder sludge with mild pericholecystic edema. With negative Murphy's sign, cholecystitis is less likely. Dilated common bile duct. Cause not identified. 2. Increased parenchymal echotexture in the kidney suggesting chronic medical renal disease. No hydronephrosis. Electronically Signed   By: Lucienne Capers M.D.   On: 09/19/2017 05:35    Dg Chest Portable 1 View  Result Date: 09/18/2017 CLINICAL DATA:  Hematemesis EXAM: PORTABLE CHEST 1 VIEW COMPARISON:  12/24/2015 FINDINGS: Normal heart size and pulmonary vascularity. No focal airspace disease or consolidation in the lungs. No blunting of costophrenic angles. No pneumothorax. Mediastinal contours appear intact. Calcification of the aorta. IMPRESSION: No evidence of active pulmonary disease.  Aortic atherosclerosis. Electronically Signed   By: Lucienne Capers M.D.   On: 09/18/2017 22:15   Scheduled Meds: . allopurinol  100 mg Oral BID  . calcitRIOL  0.25 mcg Oral Daily  . carvedilol  12.5 mg Oral BID WC  . folic acid  1 mg Oral Daily  . hydroxyurea  500 mg Oral Daily  . lidocaine  1 patch Transdermal Q24H  . multivitamin with minerals  1 tablet Oral Daily  . ondansetron (ZOFRAN) IV  4 mg Intravenous Once  . pantoprazole (PROTONIX) IV  40 mg Intravenous Once  . pantoprazole (PROTONIX) IV  40 mg Intravenous Q12H  . potassium chloride  20 mEq Oral BID   Continuous Infusions: PRN Meds:.acetaminophen **OR** acetaminophen, gi cocktail, ondansetron (ZOFRAN) IV   ASSESMENT:   *   Hematemesis.  Resolved, not recurrent.  Erosive gastritis on EGD 4/4, biopsy path pending.  On Protonix 40 IV BID  *  Acute on chronic blood loss anemia in SS.  Improved post 3 U PRBCs.  Ferritin 22.    *   Thrombocytopenia, non-critical, improved.    *  Right abd  pain with mild rise in Lipase and t bili.  GB stones, sludge mild GB wall edema, mild CBD dilatation on [place].  CT with cholelithiasis, auto splenectomy.  Note resident's consideration for outpt surgical follow up.    *  Non ischemic CM.   afib RVR.  On chronic Coumadin.  INR 4.6 reversed with K centra and Vit K.    *   Legally blind.    *  Stage 5 CKD.     PLAN   *  Switch to oral PPI, 1 x daily.  Await gastric biopsies.   From GI view, ok to discharge home.  Will arrange GI fup as he needs screening colonoscopy given age 73.   Note that the pt relies on [facility] transport service to go to MD visits and [facility] does not transport to the [facility] ED where Dr Steve Rattler clinic will be located.   ? When to restart Coumadin or other AC?Marland Kitchen    GI office will contact pt with follow up appointments.   Azucena Freed  09/20/2017, 9:19 AM Phone [phone]   Attending physician's note   I have taken an interval history, reviewed the chart and examined the patient. I agree with the Advanced Practitioner's note.  62 year old with sickle cell disease, stage IV renal disease, CHF, A. Fib on Coumadin admitted with hematemesis with hemoglobin 4.6 s/p 2 units of PRBCs to hemoglobin 7.2, had Vit K and K Centra to reverse coumadin.  EGD showed erosive gastritis.  Ultrasound and CT scan of the abdomen and pelvis was unremarkable except for cholelithiasis.  There has been no further bleeding.  He would need colonoscopy as an outpatient. (Patient not keen to stay here over the weekend for colonoscopy on Monday -Dr. Benson Norway on call over the weekend) continue PPIs.  Can start Coumadin with close monitoring of INR and hemoglobin.  Carmell Austria, MD

## 2017-09-20 NOTE — Progress Notes (Signed)
   09/20/17 0900  Clinical Encounter Type  Visited With Patient  Visit Type Spiritual support  Referral From Nurse  Consult/Referral To Chaplain  Spiritual Encounters  Spiritual Needs Prayer  Pt. Requested a [employer] From [place]. Chaplain asked Pt does he know the priest name and the Pt said that he have not been there in a while and he wanted a [employer] to pray with him. Chaplain informed Pt that she will check into it and get back with in later today. Matthew Folks

## 2017-09-21 DIAGNOSIS — I4891 Unspecified atrial fibrillation: Secondary | ICD-10-CM

## 2017-09-21 DIAGNOSIS — I5033 Acute on chronic diastolic (congestive) heart failure: Secondary | ICD-10-CM

## 2017-09-21 DIAGNOSIS — D571 Sickle-cell disease without crisis: Secondary | ICD-10-CM

## 2017-09-21 LAB — CBC
HCT: 23.1 % — ABNORMAL LOW (ref 39.0–52.0)
Hemoglobin: 8.1 g/dL — ABNORMAL LOW (ref 13.0–17.0)
MCH: 32.7 pg (ref 26.0–34.0)
MCHC: 35.1 g/dL (ref 30.0–36.0)
MCV: 93.1 fL (ref 78.0–100.0)
Platelets: 141 10*3/uL — ABNORMAL LOW (ref 150–400)
RBC: 2.48 MIL/uL — ABNORMAL LOW (ref 4.22–5.81)
RDW: 22 % — ABNORMAL HIGH (ref 11.5–15.5)
WBC: 8.5 10*3/uL (ref 4.0–10.5)

## 2017-09-21 LAB — BASIC METABOLIC PANEL
Anion gap: 10 (ref 5–15)
BUN: 103 mg/dL — ABNORMAL HIGH (ref 6–20)
CO2: 23 mmol/L (ref 22–32)
Calcium: 8.9 mg/dL (ref 8.9–10.3)
Chloride: 103 mmol/L (ref 101–111)
Creatinine, Ser: 4.33 mg/dL — ABNORMAL HIGH (ref 0.61–1.24)
GFR calc Af Amer: 16 mL/min — ABNORMAL LOW (ref >60)
GFR calc non Af Amer: 14 mL/min — ABNORMAL LOW (ref >60)
Glucose, Bld: 111 mg/dL — ABNORMAL HIGH (ref 65–99)
Potassium: 3.5 mmol/L (ref 3.5–5.1)
Sodium: 136 mmol/L (ref 135–145)

## 2017-09-21 LAB — RETICULOCYTES
RBC.: 2.48 MIL/uL — ABNORMAL LOW (ref 4.22–5.81)
Retic Count, Absolute: 245.5 10*3/uL — ABNORMAL HIGH (ref 19.0–186.0)
Retic Ct Pct: 9.9 % — ABNORMAL HIGH (ref 0.4–3.1)

## 2017-09-21 LAB — BPAM FFP
Blood Product Expiration Date: 201904082359
Blood Product Expiration Date: 201904082359
Unit Type and Rh: 8400
Unit Type and Rh: 8400

## 2017-09-21 LAB — PREPARE FRESH FROZEN PLASMA
Unit division: 0
Unit division: 0

## 2017-09-21 MED ORDER — TRAMADOL HCL 50 MG PO TABS
50.0000 mg | ORAL_TABLET | Freq: Three times a day (TID) | ORAL | Status: DC
  Administered 2017-09-21 – 2017-09-22 (×3): 100 mg via ORAL
  Filled 2017-09-21 (×3): qty 2

## 2017-09-21 MED ORDER — ONDANSETRON HCL 4 MG PO TABS: 4.0000 mg | ORAL_TABLET | Freq: Once | ORAL | Status: AC

## 2017-09-21 MED ORDER — MORPHINE SULFATE (PF) 2 MG/ML IV SOLN
2.0000 mg | Freq: Once | INTRAVENOUS | Status: AC
  Administered 2017-09-21: 2 mg via INTRAVENOUS
  Filled 2017-09-21: qty 1

## 2017-09-21 NOTE — Consult Note (Signed)
Reason for Consult:gallstones Referring Physician: Dr Frances Furbish is an 61 y.o. male.  HPI: 62 year old male with sickle cell disease, chronic kidney disease not on dialysis, A. fib on Coumadin was admitted on April 4 with a 1 day history of nausea and vomiting and hematemesis reportedly.  The patient has poor eyesight.  He states that he felt nauseous and started vomiting and then felt very weak and lethargic and came to the emergency room.  He was found to be severely anemic with a hemoglobin of of around 4-1/2 and a supratherapeutic INR.  He was transfused and his coagulopathy was reversed.  There was concern for a GI bleed and he underwent upper endoscopy which showed some mild erosive gastritis.  H. pylori testing pending.  His hemoglobin has been stable.  When he came in he had an abdominal ultrasound which showed cholelithiasis with some mild gallbladder wall thickening as well as a mildly dilated common bile duct.  His LFTs were normal except for mildly elevated bilirubin of 1.6.  His lipase was mildly elevated as well.  He had a noncontrasted CT which also demonstrated cholelithiasis.  He was doing well and tolerating diet until  he complained of some reportedly right upper quadrant pain this morning and we were asked to see in consultation  For me he states that he had acute onset of stomach pain this morning and he points to his periumbilical and epigastric region.  He denies any similar symptoms.  The pain does not radiate to his shoulder or back.  He denies any recent history over the past several months of postprandial abdominal pain or nausea or vomiting or bloating.  He denies any prior abdominal surgery.  He reports that he is having flatus.  He states he is feeling fine now  Past Medical History:  Diagnosis Date  . Blood dyscrasia   . Blood transfusion    "I've had a bunch of them"  . CHF (congestive heart failure) ([place])    followed by hf clinic  . Chronic kidney  disease (CKD), stage III (moderate) ([facility])   . Gout   . OZDGUYQI(347.4)    "probably weekly" (01/13/2014)  . Legally blind   . Shortness of breath dyspnea   . Sickle cell disease, type SS ([place])   . Swelling abdomen   . Swelling of extremity, left   . Swelling of extremity, right     Past Surgical History:  Procedure Laterality Date  . BASCILIC VEIN TRANSPOSITION Right 04/12/2014   Procedure: BASILIC VEIN TRANSPOSITION;  Surgeon: Elam Dutch, MD;  Location: [facility];  Service: Vascular;  Laterality: Right;  . CARDIAC CATHETERIZATION  05/2011  . ESOPHAGOGASTRODUODENOSCOPY N/A 09/19/2017   Procedure: ESOPHAGOGASTRODUODENOSCOPY (EGD);  Surgeon: Jackquline Denmark, MD;  Location[facility] ENDOSCOPY;  Service: Endoscopy;  Laterality: N/A;  . LEFT AND RIGHT HEART CATHETERIZATION WITH CORONARY ANGIOGRAM N/A 06/11/2011   Procedure: LEFT AND RIGHT HEART CATHETERIZATION WITH CORONARY ANGIOGRAM;  Surgeon: Larey Dresser, MD;  Location: [facility] CATH LAB;  Service: Cardiovascular;  Laterality: N/A;  . TEE WITHOUT CARDIOVERSION N/A 12/27/2015   Procedure: TRANSESOPHAGEAL ECHOCARDIOGRAM (TEE);  Surgeon: Sueanne Margarita, MD;  Location[facility] ENDOSCOPY;  Service: Cardiovascular;  Laterality: N/A;    Family History  Problem Relation Age of Onset  . Alcohol abuse Mother   . Liver disease Mother   . Cirrhosis Mother   . Heart attack Mother     Social History:  reports that he quit smoking about 42  months ago. His smoking use included cigarettes. He has a 10.25 pack-year smoking history. He has never used smokeless tobacco. He reports that he drinks about 1.8 oz of alcohol per week. He reports that he does not use drugs.  Allergies: No Known Allergies  Medications: I have reviewed the patient's current medications.  Results for orders placed or performed during the hospital encounter of 09/18/17 (from the past 48 hour(s))  Troponin I (q 6hr x 3)     Status: Abnormal   Collection Time: 09/19/17 10:31 PM  Result Value  Ref Range   Troponin I 0.03 (HH) <0.03 ng/mL    Comment: CRITICAL VALUE NOTED.  VALUE IS CONSISTENT WITH PREVIOUSLY REPORTED AND CALLED VALUE. Performed at [facility], [place] [address], [place], [place] [address]   Reticulocytes     Status: Abnormal   Collection Time: 09/20/17  7:01 AM  Result Value Ref Range   Retic Ct Pct 7.7 (H) 0.4 - 3.1 %   RBC. 2.44 (L) 4.22 - 5.81 MIL/uL   Retic Count, Absolute 187.9 (H) 19.0 - 186.0 K/uL    Comment: Performed at [facility] [address], [place], [place] [address]  CBC     Status: Abnormal   Collection Time: 09/20/17  7:01 AM  Result Value Ref Range   WBC 8.3 4.0 - 10.5 K/uL    Comment: WHITE COUNT CONFIRMED ON SMEAR   RBC 2.44 (L) 4.22 - 5.81 MIL/uL   Hemoglobin 8.0 (L) 13.0 - 17.0 g/dL   HCT 22.4 (L) 39.0 - 52.0 %   MCV 91.8 78.0 - 100.0 fL   MCH 32.8 26.0 - 34.0 pg   MCHC 35.7 30.0 - 36.0 g/dL   RDW 21.5 (H) 11.5 - 15.5 %   Platelets 140 (L) 150 - 400 K/uL    Comment: Performed at [facility], [place] [address], [place], [place] [address]  Hepatic function panel     Status: Abnormal   Collection Time: 09/20/17  7:01 AM  Result Value Ref Range   Total Protein 6.5 6.5 - 8.1 g/dL   Albumin 3.4 (L) 3.5 - 5.0 g/dL   AST 25 15 - 41 U/L   ALT 19 17 - 63 U/L   Alkaline Phosphatase 35 (L) 38 - 126 U/L   Total Bilirubin 1.6 (H) 0.3 - 1.2 mg/dL   Bilirubin, Direct 0.2 0.1 - 0.5 mg/dL   Indirect Bilirubin 1.4 (H) 0.3 - 0.9 mg/dL    Comment: Performed at [facility] [address], [place], [place] [address]  Basic metabolic panel     Status: Abnormal   Collection Time: 09/20/17  7:01 AM  Result Value Ref Range   Sodium 135 135 - 145 mmol/L   Potassium 3.2 (L) 3.5 - 5.1 mmol/L   Chloride 100 (L) 101 - 111 mmol/L   CO2 22 22 - 32 mmol/L   Glucose, Bld 110 (H) 65 - 99 mg/dL   BUN 121 (H) 6 - 20 mg/dL   Creatinine, Ser 4.48 (H) 0.61 - 1.24 mg/dL   Calcium 8.7 (L) 8.9 - 10.3 mg/dL   GFR calc non Af Amer 13 (L)  >60 mL/min   GFR calc Af Amer 15 (L) >60 mL/min    Comment: (NOTE) The eGFR has been calculated using the CKD EPI equation. This calculation has not been validated in all clinical situations. eGFR's persistently <60 mL/min signify possible Chronic Kidney Disease.    Anion gap 13  5 - 15    Comment: Performed at [facility], [place] [address] [place], [place] [address]  Lipase, blood     Status: Abnormal   Collection Time: 09/20/17  7:01 AM  Result Value Ref Range   Lipase 118 (H) 11 - 51 U/L    Comment: Performed at [facility], [place] [address], [place], [place] [address]  Reticulocytes     Status: Abnormal   Collection Time: 09/21/17  1:54 AM  Result Value Ref Range   Retic Ct Pct 9.9 (H) 0.4 - 3.1 %   RBC. 2.48 (L) 4.22 - 5.81 MIL/uL   Retic Count, Absolute 245.5 (H) 19.0 - 186.0 K/uL    Comment: Performed at [facility] [address], [place], [place] [address]  CBC     Status: Abnormal   Collection Time: 09/21/17  1:54 AM  Result Value Ref Range   WBC 8.5 4.0 - 10.5 K/uL   RBC 2.48 (L) 4.22 - 5.81 MIL/uL   Hemoglobin 8.1 (L) 13.0 - 17.0 g/dL   HCT 23.1 (L) 39.0 - 52.0 %   MCV 93.1 78.0 - 100.0 fL   MCH 32.7 26.0 - 34.0 pg   MCHC 35.1 30.0 - 36.0 g/dL   RDW 22.0 (H) 11.5 - 15.5 %   Platelets 141 (L) 150 - 400 K/uL    Comment: Performed at [facility], [place] [address] [place], [place] [address]  Basic metabolic panel     Status: Abnormal   Collection Time: 09/21/17  1:54 AM  Result Value Ref Range   Sodium 136 135 - 145 mmol/L   Potassium 3.5 3.5 - 5.1 mmol/L   Chloride 103 101 - 111 mmol/L   CO2 23 22 - 32 mmol/L   Glucose, Bld 111 (H) 65 - 99 mg/dL   BUN 103 (H) 6 - 20 mg/dL   Creatinine, Ser 4.33 (H) 0.61 - 1.24 mg/dL   Calcium 8.9 8.9 - 10.3 mg/dL   GFR calc non Af Amer 14 (L) >60 mL/min   GFR calc Af Amer 16 (L) >60 mL/min    Comment: (NOTE) The eGFR has been calculated using the CKD EPI equation. This calculation has not been  validated in all clinical situations. eGFR's persistently <60 mL/min signify possible Chronic Kidney Disease.    Anion gap 10 5 - 15    Comment: Performed at [facility] [address], [place], [place] [address]    Ct Abdomen Pelvis Wo Contrast  Result Date: 09/19/2017 CLINICAL DATA:  Hematemesis.  History of sickle cell disease EXAM: CT ABDOMEN AND PELVIS WITHOUT CONTRAST TECHNIQUE: Multidetector CT imaging of the abdomen and pelvis was performed following the standard protocol without IV contrast. COMPARISON:  02/07/2013 FINDINGS: Lower chest: Heart is enlarged. Hepatobiliary: No focal abnormality in the liver on this study without intravenous contrast. Calcified gallstone measures up to 17 mm. No intrahepatic or extrahepatic biliary dilation. Pancreas: No focal mass lesion. No dilatation of the main duct. No intraparenchymal cyst. No peripancreatic edema. Spleen: Small calcified spleen compatible with auto splenectomy. Adrenals/Urinary Tract: No adrenal nodule or mass. Similar 14 mm low-density lesion interpolar left kidney, likely a cyst. Mild fullness of both ureters noted, likely related to the prominent bladder distention. Stomach/Bowel: Stomach is nondistended. No gastric wall thickening. No evidence of outlet obstruction. Duodenum is normally positioned as is the ligament of Treitz. No small bowel wall thickening. No small bowel dilatation. The terminal ileum is  normal. The appendix is normal. No gross colonic mass. No colonic wall thickening. No substantial diverticular change. Vascular/Lymphatic: There is abdominal aortic atherosclerosis without aneurysm. There is no gastrohepatic or hepatoduodenal ligament lymphadenopathy. No intraperitoneal or retroperitoneal lymphadenopathy. No pelvic sidewall lymphadenopathy. Reproductive: The prostate gland and seminal vesicles have normal imaging features. Other: No intraperitoneal free fluid. Musculoskeletal: Bone windows reveal no worrisome lytic  or sclerotic osseous lesions. Heterogeneous mineralization is similar to prior and compatible with the reported history of sickle cell disease. IMPRESSION: 1. No acute findings in the abdomen or pelvis. 2. Cholelithiasis. 3. Auto splenectomy. 4. Mild fullness of the ureters bilaterally likely related to marked bladder distention. Electronically Signed   By: Misty Stanley M.D.   On: 09/19/2017 20:25    Review of Systems  Constitutional: Negative for weight loss.  HENT: Negative for nosebleeds.   Eyes: Negative for blurred vision.  Respiratory: Negative for shortness of breath.   Cardiovascular: Negative for chest pain, palpitations, orthopnea and PND.       Denies DOE  Gastrointestinal: Positive for abdominal pain, nausea and vomiting.  Genitourinary: Negative for dysuria and hematuria.  Musculoskeletal: Negative.   Skin: Negative for itching and rash.  Neurological: Negative for dizziness, focal weakness, seizures, loss of consciousness and headaches.       Denies TIAs, amaurosis fugax  Endo/Heme/Allergies: Does not bruise/bleed easily.  Psychiatric/Behavioral: The patient is not nervous/anxious.    Blood pressure 120/84, pulse (!) 110, temperature 98 F (36.7 C), temperature source Oral, resp. rate 15, height 5' 11"  (1.803 m), weight 62.6 kg (138 lb 0.1 oz), SpO2 98 %. Physical Exam  Vitals reviewed. Constitutional: He is oriented to person, place, and time. He appears well-developed and well-nourished. No distress.  HENT:  Head: Normocephalic and atraumatic.  Right Ear: External ear normal.  Left Ear: External ear normal.  Eyes: Conjunctivae are normal. No scleral icterus.  Neck: Normal range of motion. Neck supple. No tracheal deviation present. No thyromegaly present.  Cardiovascular: Normal rate and normal heart sounds.  Respiratory: Effort normal and breath sounds normal. No stridor. No respiratory distress. He has no wheezes.  GI: Soft. Normal appearance. He exhibits no  distension. There is no tenderness. There is no rebound and no guarding. No hernia.  Def no rt/guarding/peritonitis Not really tender either  Musculoskeletal: He exhibits no edema or tenderness.  Lymphadenopathy:    He has no cervical adenopathy.  Neurological: He is alert and oriented to person, place, and time. He exhibits normal muscle tone.  Skin: Skin is warm and dry. No rash noted. He is not diaphoretic. No erythema. No pallor.  Psychiatric: He has a normal mood and affect. His behavior is normal. Judgment and thought content normal.    Assessment/Plan: Abdominal pain Anemia Sickle cell disease Chronic kidney disease Atrial fibrillation Cholelithiasis Mildly elevated lipase GI bleed-resolved Nonischemic cardiomyopathy  He is nontoxic-appearing.  He is resting comfortably.  He definitely has cholelithiasis but it is unclear whether or not this is symptomatic cholelithiasis or acute cholecystitis.  My suspicion for acute cholecystitis is low.  He has a normal white count.  However he does have some ultrasound findings that are suggestive of perhaps chronic cholecystitis such as wall thickening.  I do not believe he needs antibiotic coverage right now There is no signs of pancreatitis on his CT  He does not give a classic story for symptomatic cholelithiasis/biliary colic; however his upper endoscopy was not terribly remarkable.  Repeat comprehensive metabolic panel in a.m., lipase Check  nuclear medicine scan of gallbladder Will follow  Leighton Ruff. Redmond Pulling, MD, FACS General, Bariatric, & Minimally Invasive Surgery [facility]  Greer Pickerel 09/21/2017, 6:20 PM

## 2017-09-22 LAB — BASIC METABOLIC PANEL
Anion gap: 10 (ref 5–15)
BUN: 84 mg/dL — ABNORMAL HIGH (ref 6–20)
CO2: 23 mmol/L (ref 22–32)
Calcium: 8.9 mg/dL (ref 8.9–10.3)
Chloride: 104 mmol/L (ref 101–111)
Creatinine, Ser: 4.15 mg/dL — ABNORMAL HIGH (ref 0.61–1.24)
GFR calc Af Amer: 17 mL/min — ABNORMAL LOW (ref >60)
GFR calc non Af Amer: 14 mL/min — ABNORMAL LOW (ref >60)
Glucose, Bld: 101 mg/dL — ABNORMAL HIGH (ref 65–99)
Potassium: 4.2 mmol/L (ref 3.5–5.1)
Sodium: 137 mmol/L (ref 135–145)

## 2017-09-22 LAB — HEPATIC FUNCTION PANEL
ALT: 16 U/L — ABNORMAL LOW (ref 17–63)
AST: 22 U/L (ref 15–41)
Albumin: 3.3 g/dL — ABNORMAL LOW (ref 3.5–5.0)
Alkaline Phosphatase: 41 U/L (ref 38–126)
Bilirubin, Direct: 0.2 mg/dL (ref 0.1–0.5)
Indirect Bilirubin: 1 mg/dL — ABNORMAL HIGH (ref 0.3–0.9)
Total Bilirubin: 1.2 mg/dL (ref 0.3–1.2)
Total Protein: 6.3 g/dL — ABNORMAL LOW (ref 6.5–8.1)

## 2017-09-22 LAB — CBC
HCT: 23.9 % — ABNORMAL LOW (ref 39.0–52.0)
Hemoglobin: 8 g/dL — ABNORMAL LOW (ref 13.0–17.0)
MCH: 31.6 pg (ref 26.0–34.0)
MCHC: 33.5 g/dL (ref 30.0–36.0)
MCV: 94.5 fL (ref 78.0–100.0)
Platelets: 140 10*3/uL — ABNORMAL LOW (ref 150–400)
RBC: 2.53 MIL/uL — ABNORMAL LOW (ref 4.22–5.81)
RDW: 21.8 % — ABNORMAL HIGH (ref 11.5–15.5)
WBC: 9.8 10*3/uL (ref 4.0–10.5)

## 2017-09-22 LAB — LIPASE, BLOOD: Lipase: 77 U/L — ABNORMAL HIGH (ref 11–51)

## 2017-09-22 LAB — RETICULOCYTES
RBC.: 2.53 MIL/uL — ABNORMAL LOW (ref 4.22–5.81)
Retic Count, Absolute: 250.5 10*3/uL — ABNORMAL HIGH (ref 19.0–186.0)
Retic Ct Pct: 9.9 % — ABNORMAL HIGH (ref 0.4–3.1)

## 2017-09-22 MED ORDER — TRAMADOL HCL 50 MG PO TABS
100.0000 mg | ORAL_TABLET | Freq: Once | ORAL | Status: AC
  Administered 2017-09-22: 100 mg via ORAL
  Filled 2017-09-22: qty 2

## 2017-09-22 NOTE — Progress Notes (Signed)
Family Medicine Teaching Service Daily Progress Note Intern Pager: [phone]  Patient name: Patrick [name] Medical record number: [id number] Date of birth: 1955-08-27 Age: 62 y.o. Gender: male  Primary Care Provider: [name], MD Consultants: Gastroenterology Code Status: Full (confirmed on admission)  Pt Overview and Major Events to Date:  Patrick [name] is a 62 y.o. male presenting with 1 day of hematemesis. PMH is significant for sickle cell anemia, HFmrEF, CKD stage V, visually impaired, atrial fibrillation, gout.    4/3 - s/p 3 pRBC  4/4 - EGD   Assessment and Plan:  Upper GI Bleed, improved EGD showed erosive gastritis. Biopsied with path pending. No active bleeding. Per GI, switch to oral protonix 40 mg qd, ready for discharge.  Urease negative.   -biopsy path pending  -Follow up outpatient GI for colonoscopy   Symptomatic cholelithiasis, worsening CT abdomen showed cholelithiasis, but no acute processes. Lipase slightly increased from 79 > 118. Abdominal pain not worse with nausea.  -Telemetry and continuous pulse ox -General surgery consulted, appreciate recs: not clear whether this is symptomatic cholelithiasis or acute cholecystitis.  Low suspicion for acute chole with normal white count.  No antibiotic coverage for now, recommend repeat CMP and lipase on AM labs, nuclear medicine scan of gallbladder.    -CMP with normal AST of 22, ALT of 16.  Lipase elevated to 77.   -diet today, NPO at MN for HIDA 4/8 -NM gallbladder scan ordered  ABLA/Sickle Cell Anemia, improved Stable Hgb of 8 with AM labs. Retic % 9.9, but unsure of accuracy in recent of transfusion. Complaints of mild abdominal pain. On hydroxyurea 500 mg daily.  -monitor CBC -cont hydroxyurea and folate   Atrial fibrillation, rate controlled GI has ok'd restarting warfarin as Hg stable and no signs of active bleeding in the setting of bleeding, discussed risk and benefits with patient  regarding anticoagulation (Stroke vs major bleeding event). Will not restart anticoagulation during this hospitalization. Patient wishes to discuss with cardiologist on discharge  -Continue Coreg -Telemetry   HFmrEF Last echo 07/2017. EF 40-45% G1DD.  No evidence of volume. At home Takes Coreg 12.5 twice daily, hydralazine 50 mg twice daily, torsemide 100 mg daily, Imdur 60 mg daily -currently holding IMDUR, hydralazine and Torsemide in setting of low normal BP -Daily weights and strict I's and O's  CKD V, SCr improving Cr 4.8> 4.33>4.15.  -Daily BMP -Strict I's and O's  Hypokalemia, resolved  -4.2 on AM labs.  -Continue to monitor  FEN/GI: renal/carb mod Prophylaxis: SCDs  Disposition: Pending HIDA scan  Subjective:  Patient feels better.  Abdominal pain has largely resolved.  No nausea currently but has vomited once this morning.  Eating and drinking well.  No diarrhea.  Girlfriend at bedside.  Objective: Temp:  [97.4 F (36.3 C)-98.2 F (36.8 C)] 97.4 F (36.3 C) (04/07 0901) Pulse Rate:  [64-86] 86 (04/07 0900) Resp:  [10-20] 16 (04/07 0900) BP: (112-151)/(65-109) 151/109 (04/07 0900) SpO2:  [97 %-100 %] 97 % (04/07 0900)   Physical Exam: Gen: Pleasant 62 year old male, NAD, resting comfortably CV: RRR with no MRG Pulm: Normal effort, clear to auscultation bilaterally, no wheeze GI: No TTP over RUQ, no rebound or guarding present, positive bowel sounds MSK: No edema or tenderness Skin: warm, dry, no rash Neuro: Alert, oriented x3, grossly normal, moves all extremities Psych: Normal mood and affect  Laboratory: Recent Labs  Lab 09/20/17 0701 09/21/17 0154 09/22/17 0416  WBC 8.3 8.5 9.8  HGB 8.0*  8.1* 8.0*  HCT 22.4* 23.1* 23.9*  PLT 140* 141* 140*   Recent Labs  Lab 09/18/17 2140 09/20/17 0701 09/21/17 0154 09/22/17 0416 09/22/17 0747  NA  --  135 136 137  --   K  --  3.2* 3.5 4.2  --   CL  --  100* 103 104  --   CO2  --  22 23 23   --   BUN   --  121* 103* 84*  --   CREATININE  --  4.48* 4.33* 4.15*  --   CALCIUM  --  8.7* 8.9 8.9  --   PROT 6.9 6.5  --   --  6.3*  BILITOT 1.3* 1.6*  --   --  1.2  ALKPHOS 36* 35*  --   --  41  ALT 25 19  --   --  16*  AST 29 25  --   --  22  GLUCOSE  --  110* 111* 101*  --    Imaging/Diagnostic Tests: Ct Abdomen Pelvis Wo Contrast  Result Date: 09/19/2017 CLINICAL DATA:  Hematemesis.  History of sickle cell disease EXAM: CT ABDOMEN AND PELVIS WITHOUT CONTRAST TECHNIQUE: Multidetector CT imaging of the abdomen and pelvis was performed following the standard protocol without IV contrast. COMPARISON:  02/07/2013 FINDINGS: Lower chest: Heart is enlarged. Hepatobiliary: No focal abnormality in the liver on this study without intravenous contrast. Calcified gallstone measures up to 17 mm. No intrahepatic or extrahepatic biliary dilation. Pancreas: No focal mass lesion. No dilatation of the main duct. No intraparenchymal cyst. No peripancreatic edema. Spleen: Small calcified spleen compatible with auto splenectomy. Adrenals/Urinary Tract: No adrenal nodule or mass. Similar 14 mm low-density lesion interpolar left kidney, likely a cyst. Mild fullness of both ureters noted, likely related to the prominent bladder distention. Stomach/Bowel: Stomach is nondistended. No gastric wall thickening. No evidence of outlet obstruction. Duodenum is normally positioned as is the ligament of Treitz. No small bowel wall thickening. No small bowel dilatation. The terminal ileum is normal. The appendix is normal. No gross colonic mass. No colonic wall thickening. No substantial diverticular change. Vascular/Lymphatic: There is abdominal aortic atherosclerosis without aneurysm. There is no gastrohepatic or hepatoduodenal ligament lymphadenopathy. No intraperitoneal or retroperitoneal lymphadenopathy. No pelvic sidewall lymphadenopathy. Reproductive: The prostate gland and seminal vesicles have normal imaging features. Other: No  intraperitoneal free fluid. Musculoskeletal: Bone windows reveal no worrisome lytic or sclerotic osseous lesions. Heterogeneous mineralization is similar to prior and compatible with the reported history of sickle cell disease. IMPRESSION: 1. No acute findings in the abdomen or pelvis. 2. Cholelithiasis. 3. Auto splenectomy. 4. Mild fullness of the ureters bilaterally likely related to marked bladder distention. Electronically Signed   By: Misty Stanley M.D.   On: 09/19/2017 20:25   Lovenia Kim, MD 09/22/2017, 12:03 PM PGY-2, [facility] Intern pager: [phone], text pages welcome

## 2017-09-22 NOTE — Progress Notes (Signed)
[facility] Progress Note  3 Days Post-Op  Subjective: CC- n/v Patient was supposed to have HIDA scan today but received tramadol this AM so it will be delayed until tomorrow.  He currently reports no abdominal pain, but states that he did vomit once this morning. It was nonbloody.   Objective: Vital signs in last 24 hours: Temp:  [97.6 F (36.4 C)-98.2 F (36.8 C)] 97.6 F (36.4 C) (04/07 0444) Pulse Rate:  [64-84] 65 (04/07 0849) Resp:  [10-20] 20 (04/07 0700) BP: (112-132)/(65-90) 132/84 (04/07 0849) SpO2:  [97 %-100 %] 97 % (04/07 0700) Last BM Date: 09/18/17  Intake/Output from previous day: 04/06 0701 - 04/07 0700 In: -  Out: 375 [Urine:375] Intake/Output this shift: Total I/O In: -  Out: 300 [Urine:300]  PE: Gen:  Alert, NAD, pleasant HEENT: EOM's intact, pupils equal and round Card:  Irregularly irregular Pulm:  CTAB, no W/R/R, effort normal Abd: Soft, NT/ND, +BS, no HSM, no hernia Ext:  Calves soft and nontender Psych: A&Ox3  Skin: no rashes noted, warm and dry  Lab Results:  Recent Labs    09/21/17 0154 09/22/17 0416  WBC 8.5 9.8  HGB 8.1* 8.0*  HCT 23.1* 23.9*  PLT 141* 140*   BMET Recent Labs    09/21/17 0154 09/22/17 0416  NA 136 137  K 3.5 4.2  CL 103 104  CO2 23 23  GLUCOSE 111* 101*  BUN 103* 84*  CREATININE 4.33* 4.15*  CALCIUM 8.9 8.9   PT/INR No results for input(s): LABPROT, INR in the last 72 hours. CMP     Component Value Date/Time   NA 137 09/22/2017 0416   NA 135 05/31/2017 0859   K 4.2 09/22/2017 0416   CL 104 09/22/2017 0416   CO2 23 09/22/2017 0416   GLUCOSE 101 (H) 09/22/2017 0416   BUN 84 (H) 09/22/2017 0416   BUN 79 (HH) 05/31/2017 0859   CREATININE 4.15 (H) 09/22/2017 0416   CREATININE 2.90 (H) 08/11/2015 1359   CALCIUM 8.9 09/22/2017 0416   PROT 6.3 (L) 09/22/2017 0747   PROT 7.6 05/31/2017 0859   ALBUMIN 3.3 (L) 09/22/2017 0747   ALBUMIN 4.7 05/31/2017 0859   AST 22 09/22/2017 0747   ALT 16 (L) 09/22/2017 0747   ALKPHOS 41 09/22/2017 0747   BILITOT 1.2 09/22/2017 0747   BILITOT 1.1 05/31/2017 0859   GFRNONAA 14 (L) 09/22/2017 0416   GFRNONAA 23 (L) 08/11/2015 1359   GFRAA 17 (L) 09/22/2017 0416   GFRAA 27 (L) 08/11/2015 1359   Lipase     Component Value Date/Time   LIPASE 77 (H) 09/22/2017 0747       Studies/Results: No results found.  Anti-infectives: Anti-infectives (From admission, onward)   None       Assessment/Plan Anemia Sickle cell disease CKD-5 - not on dialysis Atrial fibrillation - holding coumardin GI bleed - resolved. S/p EGD 4/4 (gastritis), GI planning OP colonoscopy Nonischemic cardiomyopathy - EF 40-45% G1DD Mildly elevated lipase - trending down, 77 from 118  Cholelithiasis, ?acute cholecystitis vs symptomatic cholelithiasis - u/s shows cholelithiasis and gallbladder sludge with mild pericholecystic edema, negative Murphy's sign, dilated CBD - CT scan shows calcified gallstone measures up to 65mm. No intrahepatic or extrahepatic biliary dilation - LFTs and WBC WNL today  ID - none FEN - renal/CM diet, NPO after MN VTE - SCDs, holding coumadin Foley - none Follow up - TBD  Plan - HIDA scheduled for tomorrow. Ok for diet today, NPO after MN;  no narcotics after midnight before HIDA scan. Repeat labs in AM.   LOS: 3 days    Wellington Hampshire , [facility] 09/22/2017, 9:04 AM Pager: [phone] Consults: [phone] Mon-Fri 7:00 am-4:30 pm Sat-Sun 7:00 am-11:30 am

## 2017-09-23 ENCOUNTER — Inpatient Hospital Stay (HOSPITAL_COMMUNITY): Payer: PPO

## 2017-09-23 LAB — CBC
HCT: 24.4 % — ABNORMAL LOW (ref 39.0–52.0)
Hemoglobin: 8.2 g/dL — ABNORMAL LOW (ref 13.0–17.0)
MCH: 32.8 pg (ref 26.0–34.0)
MCHC: 33.6 g/dL (ref 30.0–36.0)
MCV: 97.6 fL (ref 78.0–100.0)
Platelets: 148 10*3/uL — ABNORMAL LOW (ref 150–400)
RBC: 2.5 MIL/uL — ABNORMAL LOW (ref 4.22–5.81)
RDW: 22.2 % — ABNORMAL HIGH (ref 11.5–15.5)
WBC: 9 10*3/uL (ref 4.0–10.5)

## 2017-09-23 LAB — COMPREHENSIVE METABOLIC PANEL
ALT: 14 U/L — ABNORMAL LOW (ref 17–63)
AST: 19 U/L (ref 15–41)
Albumin: 3.3 g/dL — ABNORMAL LOW (ref 3.5–5.0)
Alkaline Phosphatase: 38 U/L (ref 38–126)
Anion gap: 11 (ref 5–15)
BUN: 78 mg/dL — ABNORMAL HIGH (ref 6–20)
CO2: 22 mmol/L (ref 22–32)
Calcium: 8.9 mg/dL (ref 8.9–10.3)
Chloride: 105 mmol/L (ref 101–111)
Creatinine, Ser: 4.26 mg/dL — ABNORMAL HIGH (ref 0.61–1.24)
GFR calc Af Amer: 16 mL/min — ABNORMAL LOW (ref >60)
GFR calc non Af Amer: 14 mL/min — ABNORMAL LOW (ref >60)
Glucose, Bld: 94 mg/dL (ref 65–99)
Potassium: 4.4 mmol/L (ref 3.5–5.1)
Sodium: 138 mmol/L (ref 135–145)
Total Bilirubin: 1.2 mg/dL (ref 0.3–1.2)
Total Protein: 6.2 g/dL — ABNORMAL LOW (ref 6.5–8.1)

## 2017-09-23 LAB — LIPASE, BLOOD: Lipase: 56 U/L — ABNORMAL HIGH (ref 11–51)

## 2017-09-23 LAB — RETICULOCYTES
RBC.: 2.5 MIL/uL — ABNORMAL LOW (ref 4.22–5.81)
Retic Count, Absolute: 247.5 10*3/uL — ABNORMAL HIGH (ref 19.0–186.0)
Retic Ct Pct: 9.9 % — ABNORMAL HIGH (ref 0.4–3.1)

## 2017-09-23 MED ORDER — TECHNETIUM TC 99M MEBROFENIN IV KIT
5.2000 | PACK | Freq: Once | INTRAVENOUS | Status: AC | PRN
  Administered 2017-09-23: 5.2 via INTRAVENOUS

## 2017-09-23 MED ORDER — PEG-KCL-NACL-NASULF-NA ASC-C 100 G PO SOLR
1.0000 | Freq: Once | ORAL | Status: AC
  Administered 2017-09-23: 200 g via ORAL
  Filled 2017-09-23: qty 1

## 2017-09-23 NOTE — Progress Notes (Signed)
Family Medicine Teaching Service Daily Progress Note Intern Pager: [phone]  Patient name: Patrick [name] Medical record number: [id number] Date of birth: 07-Jun-1956 Age: 62 y.o. Gender: male  Primary Care Provider: [name], MD Consultants: Gastroenterology Code Status: Full (confirmed on admission)  Pt Overview and Major Events to Date:  Patrick [name] is a 62 y.o. male presenting with 1 day of hematemesis. PMH is significant for sickle cell anemia, HFmrEF, CKD stage V, visually impaired, atrial fibrillation, gout.    4/3 - s/p 3 pRBC  4/4 - EGD  4/8 - [place]  Assessment and Plan:  Upper GI Bleed, improved EGD showed erosive gastritis. Biopsied with path pending. No active bleeding. Per GI, switch to oral protonix 40 mg qd, Planned inpatient colonoscopy on clear liquids, starting prep, NPO at midnight  Symptomatic cholelithiasis, improved HIDA scan normal. CT abdomen showed cholelithiasis, but no acute processes. Abdominal pain improved -Telemetry and continuous pulse ox -General surgery has signed off  ABLA/Sickle Cell Anemia, improved Stable Hgb of 8 with AM labs.  -monitor CBC -cont hydroxyurea and folate   Atrial fibrillation, rate controlled GI has ok'd restarting warfarin as Hg stable and no signs of active bleeding in the setting of bleeding, discussed risk and benefits with patient regarding anticoagulation (Stroke vs major bleeding event). Will not restart anticoagulation during this hospitalization. Patient wishes to discuss with cardiologist on discharge  -Continue Coreg -Telemetry    HFmrEF Last echo 07/2017. EF 40-45% G1DD.  No evidence of volume. At home Takes Coreg 12.5 twice daily, hydralazine 50 mg twice daily, torsemide 100 mg daily, Imdur 60 mg daily -currently holding IMDUR, hydralazine and Torsemide in setting of low normal BP -Daily weights and strict I's and O's  CKD V, stable Cr 4.2.  -Daily BMP -Strict I's and  O's  Hypokalemia, resolved   FEN/GI: renal/carb mod Prophylaxis: SCDs  Disposition: Inpatient for colonscopy for tomorrow  Subjective:  Patient feels better.  Abdominal pain has resolved.  No other complaints Objective: Temp:  [97.1 F (36.2 C)-97.9 F (36.6 C)] 97.5 F (36.4 C) (04/08 0500) Pulse Rate:  [62-82] 75 (04/08 0328) Resp:  [9-20] 9 (04/08 0328) BP: (96-141)/(58-92) 115/76 (04/08 0328) SpO2:  [97 %-100 %] 97 % (04/08 0328)   Physical Exam: Gen:  NAD, resting comfortably CV: RRR with no MRG Pulm: Normal effort, clear to auscultation bilaterally, no wheeze GI: nontender no rebound or guarding present, MSK: No edema or tenderness Skin: warm, dry, no rash Neuro: Alert, oriented x3, grossly normal, moves all extremities Psych: Normal mood and affect  Laboratory: Recent Labs  Lab 09/21/17 0154 09/22/17 0416 09/23/17 0307  WBC 8.5 9.8 9.0  HGB 8.1* 8.0* 8.2*  HCT 23.1* 23.9* 24.4*  PLT 141* 140* 148*   Recent Labs  Lab 09/20/17 0701 09/21/17 0154 09/22/17 0416 09/22/17 0747 09/23/17 0307  NA 135 136 137  --  138  K 3.2* 3.5 4.2  --  4.4  CL 100* 103 104  --  105  CO2 22 23 23   --  22  BUN 121* 103* 84*  --  78*  CREATININE 4.48* 4.33* 4.15*  --  4.26*  CALCIUM 8.7* 8.9 8.9  --  8.9  PROT 6.5  --   --  6.3* 6.2*  BILITOT 1.6*  --   --  1.2 1.2  ALKPHOS 35*  --   --  41 38  ALT 19  --   --  16* 14*  AST 25  --   --  22 19  GLUCOSE 110* 111* 101*  --  94  Bonnita Hollow, MD 09/23/2017, 9:46 AM PGY-1, [place] Intern pager: [phone], text pages welcome

## 2017-09-23 NOTE — Progress Notes (Signed)
    4 Days Post-Op  Subjective: Patient with no pain today.  Ate diet yesterday with minimal issues.  Objective: Vital signs in last 24 hours: Temp:  [97.1 F (36.2 C)-97.9 F (36.6 C)] 97.5 F (36.4 C) (04/08 0500) Pulse Rate:  [62-86] 75 (04/08 0328) Resp:  [9-20] 9 (04/08 0328) BP: (96-151)/(58-109) 115/76 (04/08 0328) SpO2:  [97 %-100 %] 97 % (04/08 0328) Last BM Date: 09/19/17  Intake/Output from previous day: 04/07 0701 - 04/08 0700 In: -  Out: 950 [Urine:950] Intake/Output this shift: No intake/output data recorded.  PE: Heart: a fib Lungs: CTAB Abd: soft, NT, ND, +BS  Lab Results:  Recent Labs    09/22/17 0416 09/23/17 0307  WBC 9.8 9.0  HGB 8.0* 8.2*  HCT 23.9* 24.4*  PLT 140* 148*   BMET Recent Labs    09/22/17 0416 09/23/17 0307  NA 137 138  K 4.2 4.4  CL 104 105  CO2 23 22  GLUCOSE 101* 94  BUN 84* 78*  CREATININE 4.15* 4.26*  CALCIUM 8.9 8.9   PT/INR No results for input(s): LABPROT, INR in the last 72 hours. CMP     Component Value Date/Time   NA 138 09/23/2017 0307   NA 135 05/31/2017 0859   K 4.4 09/23/2017 0307   CL 105 09/23/2017 0307   CO2 22 09/23/2017 0307   GLUCOSE 94 09/23/2017 0307   BUN 78 (H) 09/23/2017 0307   BUN 79 (HH) 05/31/2017 0859   CREATININE 4.26 (H) 09/23/2017 0307   CREATININE 2.90 (H) 08/11/2015 1359   CALCIUM 8.9 09/23/2017 0307   PROT 6.2 (L) 09/23/2017 0307   PROT 7.6 05/31/2017 0859   ALBUMIN 3.3 (L) 09/23/2017 0307   ALBUMIN 4.7 05/31/2017 0859   AST 19 09/23/2017 0307   ALT 14 (L) 09/23/2017 0307   ALKPHOS 38 09/23/2017 0307   BILITOT 1.2 09/23/2017 0307   BILITOT 1.1 05/31/2017 0859   GFRNONAA 14 (L) 09/23/2017 0307   GFRNONAA 23 (L) 08/11/2015 1359   GFRAA 16 (L) 09/23/2017 0307   GFRAA 27 (L) 08/11/2015 1359   Lipase     Component Value Date/Time   LIPASE 56 (H) 09/23/2017 0307       Studies/Results: No results found.  Anti-infectives: Anti-infectives (From admission,  onward)   None       Assessment/Plan Anemia Sickle cell disease CKD-5 - not on dialysis Atrial fibrillation - holding coumardin GI bleed - resolved. S/p EGD 4/4 (gastritis), GI planning OP colonoscopy Nonischemic cardiomyopathy - EF 40-45% G1DD Mildly elevated lipase - trending down, 77 from 118  Cholelithiasis, ?acute cholecystitis vs symptomatic cholelithiasis - u/s shows cholelithiasis and gallbladder sludge with mild pericholecystic edema, negative Murphy's sign, dilated CBD - CT scan shows calcified gallstone measures up to 100mm. No intrahepatic or extrahepatic biliary dilation - LFTs and WBC WNL today -patient doesn't act like he has acute cholecystitis, but will await HIDA scan today to determine if he needs a cholecystectomy or not.  ID - none FEN - NPO for HIDA VTE - SCDs, holding coumadin Foley - none Follow up - TBD  Plan - HIDA scheduled today.  Will await this test for further recommendations.  Patient has no WBC or elevation of his LFTs.  He has no pain this morning to indicate that he has acute cholecystitis.  Will follow.    LOS: 4 days    Henreitta Cea , [facility] 09/23/2017, 8:58 AM Pager: [phone]

## 2017-09-23 NOTE — Progress Notes (Signed)
Patient's HIDA scan is normal with normal uptake of the gallbladder.  No plans for surgical intervention at this time.  We will sign off.  GI going to proceed with colonoscopy tomorrow to finish acutely working up anemia.  Patrick [name] 1:58 PM 09/23/2017

## 2017-09-23 NOTE — Progress Notes (Addendum)
[facility] Progress Note   Chief Complaint:   GI bleed and abdominal pain   SUBJECTIVE:    Back from HIDA. Feels okay.   ASSESSMENT AND PLAN:   1. 62 yo male admitted with abdominal pain and hematemesis on coumadin. Erosive gastritis on EGD. Clo test negative.  -continue PPI  -he was originally not open to inpatient colonoscopy for further evaluation of bleeding / anemia but now agrees. Coumadin still on hold. Will prep for colonoscopy to be done tomorrow. The risks and benefits of the procedure were discussed and the patient agrees to proceed.   -2. Acute on chronic blood loss / SS.  -Hgb 8.2, up from 4.6 post 3 units of blood.     3. Abdominal pain. CT scan unremarkable except for cholelithiasis. No duct dilation on u/s nor CT scan. Minimally elevated Bili and mildly dilated CBD without obvious stone. Bilirubin elevation could be due to decreased renal clearance. Lipase mildly elevated but no radiologic findings of pancreatitis on imaging.  -HIDA normal. Spoke with Surgery and they are signing off  4. Chronic Coumadin for history of A. fib.  Coumadin on hold.  -Please do not start Coumadin as we are proceeding with colonoscopy tomorrow   5.  Legally blind.   6. CKD, stage 5.  S/p right UE vascular fistula.  No on HD yet    Attending physician's note   I have taken an interval history, reviewed the chart and examined the patient. I agree with the Advanced Practitioner's note, impression and recommendations.  HIDA scan was negative for acute cholecystitis.  Patient for colonoscopy tomorrow for evaluation of anemia Coumadin is on hold.  Discussed the risks and benefits of colonoscopy with the patient.  Carmell Austria, MD   OBJECTIVE:      Vital signs in last 24 hours: Temp:  [97.1 F (36.2 C)-97.9 F (36.6 C)] 97.5 F (36.4 C) (04/08 0500) Pulse Rate:  [62-82] 75 (04/08 0328) Resp:  [9-20] 9 (04/08 0328) BP: (96-141)/(58-92) 115/76 (04/08 0328) SpO2:   [97 %-100 %] 97 % (04/08 0328) Last BM Date: 09/19/17 General:   Alert, well-developed, black male  in NAD EENT:  Normal hearing, non icteric sclera, conjunctive pink.  Heart:  Regular rate and rhythm; no murmurs. No lower extremity edema Pulm: Normal respiratory effort Abdomen:  Soft, nondistended, nontender.  Normal bowel sounds, no masses felt. No hepatomegaly.    Neurologic:  Alert and  oriented x4;  grossly normal neurologically. Psych:  Pleasant, cooperative.  Normal mood and affect.   Intake/Output from previous day: 04/07 0701 - 04/08 0700 In: -  Out: 950 [Urine:950] Intake/Output this shift: No intake/output data recorded.  Lab Results: Recent Labs    09/21/17 0154 09/22/17 0416 09/23/17 0307  WBC 8.5 9.8 9.0  HGB 8.1* 8.0* 8.2*  HCT 23.1* 23.9* 24.4*  PLT 141* 140* 148*   BMET Recent Labs    09/21/17 0154 09/22/17 0416 09/23/17 0307  NA 136 137 138  K 3.5 4.2 4.4  CL 103 104 105  CO2 23 23 22   GLUCOSE 111* 101* 94  BUN 103* 84* 78*  CREATININE 4.33* 4.15* 4.26*  CALCIUM 8.9 8.9 8.9   LFT Recent Labs    09/22/17 0747 09/23/17 0307  PROT 6.3* 6.2*  ALBUMIN 3.3* 3.3*  AST 22 19  ALT 16* 14*  ALKPHOS 41 38  BILITOT 1.2 1.2  BILIDIR 0.2  --   IBILI 1.0*  --  Active Problems:   Abdominal pain   Acute GI bleeding     LOS: 4 days   Tye Savoy ,NP 09/23/2017, 9:10 AM  Pager number [phone]

## 2017-09-23 NOTE — Discharge Instructions (Signed)
Due to your bleeding, your anticoagulation (Warfarin) was held. Be sure to discuss with your primary doctor the risk and benefits of not having this continued.  Due to normal blood pressures during your hospitalization,  Gastrointestinal Bleeding Gastrointestinal bleeding is bleeding somewhere along the path food travels through the body (digestive tract). This path is anywhere between the mouth and the opening of the butt (anus). You may have blood in your poop (stools) or have black poop. If you throw up (vomit), there may be blood in it. This condition can be mild, serious, or even life-threatening. If you have a lot of bleeding, you may need to stay in the hospital. Follow these instructions at home:  Take over-the-counter and prescription medicines only as told by your doctor.  Eat foods that have a lot of fiber in them. These foods include whole grains, fruits, and vegetables. You can also try eating 1-3 prunes each day.  Drink enough fluid to keep your pee (urine) clear or pale yellow.  Keep all follow-up visits as told by your doctor. This is important. Contact a doctor if:  Your symptoms do not get better. Get help right away if:  Your bleeding gets worse.  You feel dizzy or you pass out (faint).  You feel weak.  You have very bad cramps in your back or belly (abdomen).  You pass large clumps of blood (clots) in your poop.  Your symptoms are getting worse. This information is not intended to replace advice given to you by your health care provider. Make sure you discuss any questions you have with your health care provider. Document Released: 03/13/2008 Document Revised: 11/10/2015 Document Reviewed: 11/22/2014 Elsevier Interactive Patient Education  2018 [employer].

## 2017-09-23 NOTE — Care Management Important Message (Signed)
Important Message  Patient Details  Name: Patrick [name] MRN: [id number] Date of Birth: 07-Aug-1955   Medicare Important Message Given:  Yes    [name] 09/23/2017, 2:32 PM

## 2017-09-24 ENCOUNTER — Inpatient Hospital Stay (HOSPITAL_COMMUNITY): Payer: PPO | Admitting: Certified Registered"

## 2017-09-24 ENCOUNTER — Inpatient Hospital Stay (HOSPITAL_COMMUNITY): Admission: RE | Admit: 2017-09-24 | Payer: PPO | Source: Ambulatory Visit

## 2017-09-24 ENCOUNTER — Encounter (HOSPITAL_COMMUNITY): Payer: Self-pay | Admitting: *Deleted

## 2017-09-24 ENCOUNTER — Encounter (HOSPITAL_COMMUNITY)
Admission: EM | Disposition: A | Discharge status: Home / Self Care | Payer: Self-pay | Source: Home / Self Care | Attending: Family Medicine

## 2017-09-24 DIAGNOSIS — D500 Iron deficiency anemia secondary to blood loss (chronic): Secondary | ICD-10-CM

## 2017-09-24 HISTORY — PX: COLONOSCOPY WITH PROPOFOL: SHX5780

## 2017-09-24 LAB — BASIC METABOLIC PANEL
Anion gap: 11 (ref 5–15)
BUN: 66 mg/dL — ABNORMAL HIGH (ref 6–20)
CO2: 23 mmol/L (ref 22–32)
Calcium: 9 mg/dL (ref 8.9–10.3)
Chloride: 104 mmol/L (ref 101–111)
Creatinine, Ser: 3.63 mg/dL — ABNORMAL HIGH (ref 0.61–1.24)
GFR calc Af Amer: 20 mL/min — ABNORMAL LOW (ref >60)
GFR calc non Af Amer: 17 mL/min — ABNORMAL LOW (ref >60)
Glucose, Bld: 97 mg/dL (ref 65–99)
Potassium: 4.1 mmol/L (ref 3.5–5.1)
Sodium: 138 mmol/L (ref 135–145)

## 2017-09-24 LAB — CBC WITH DIFFERENTIAL/PLATELET
Basophils Absolute: 0.1 10*3/uL (ref 0.0–0.1)
Basophils Relative: 1 %
Eosinophils Absolute: 0.4 10*3/uL (ref 0.0–0.7)
Eosinophils Relative: 4 %
HCT: 25.9 % — ABNORMAL LOW (ref 39.0–52.0)
Hemoglobin: 8.5 g/dL — ABNORMAL LOW (ref 13.0–17.0)
Lymphocytes Relative: 9 %
Lymphs Abs: 0.8 10*3/uL (ref 0.7–4.0)
MCH: 32.1 pg (ref 26.0–34.0)
MCHC: 32.8 g/dL (ref 30.0–36.0)
MCV: 97.7 fL (ref 78.0–100.0)
Monocytes Absolute: 1.7 10*3/uL — ABNORMAL HIGH (ref 0.1–1.0)
Monocytes Relative: 19 %
Neutro Abs: 6.1 10*3/uL (ref 1.7–7.7)
Neutrophils Relative %: 67 %
Platelets: 161 10*3/uL (ref 150–400)
RBC: 2.65 MIL/uL — ABNORMAL LOW (ref 4.22–5.81)
RDW: 21.4 % — ABNORMAL HIGH (ref 11.5–15.5)
WBC: 9.1 10*3/uL (ref 4.0–10.5)

## 2017-09-24 LAB — RETICULOCYTES
RBC.: 2.65 MIL/uL — ABNORMAL LOW (ref 4.22–5.81)
Retic Count, Absolute: 265 10*3/uL — ABNORMAL HIGH (ref 19.0–186.0)
Retic Ct Pct: 10 % — ABNORMAL HIGH (ref 0.4–3.1)

## 2017-09-24 SURGERY — COLONOSCOPY WITH PROPOFOL
Anesthesia: Monitor Anesthesia Care

## 2017-09-24 MED ORDER — SODIUM CHLORIDE 0.9 % IV SOLN
INTRAVENOUS | Status: AC | PRN
  Administered 2017-09-24: 10:00:00 via INTRAVENOUS
  Administered 2017-09-24: 500 mL via INTRAVENOUS

## 2017-09-24 MED ORDER — PANTOPRAZOLE SODIUM 40 MG PO TBEC: 40.0000 mg | DELAYED_RELEASE_TABLET | Freq: Every day | ORAL | 0 refills | Status: DC

## 2017-09-24 MED ORDER — PROPOFOL 500 MG/50ML IV EMUL
INTRAVENOUS | Status: DC | PRN
  Administered 2017-09-24: 125 ug/kg/min via INTRAVENOUS

## 2017-09-24 MED ORDER — PROPOFOL 10 MG/ML IV BOLUS
INTRAVENOUS | Status: DC | PRN
  Administered 2017-09-24 (×2): 30 mg via INTRAVENOUS
  Administered 2017-09-24: 40 mg via INTRAVENOUS

## 2017-09-24 SURGICAL SUPPLY — 22 items

## 2017-09-24 NOTE — Op Note (Signed)
[facility] Patient Name: Patrick [name] Procedure Date : 09/24/2017 MRN: [id number] Attending MD: Jackquline Denmark MD, MD Date of Birth: [birth year] CSN: [id number] Age: 62 Admit Type: Inpatient Procedure:                Colonoscopy Indications:              Unexplained iron deficiency anemia, history of GI                            bleeding Providers:                Jackquline Denmark MD, MD, Presley Raddle, RN, William Dalton, Technician Referring MD:             Hospitalist Medicines:                See the Anesthesia note for documentation of the                            administered medications Complications:            No immediate complications. Estimated Blood Loss:     Estimated blood loss: none. Procedure:                Pre-Anesthesia Assessment:                           - Prior to the procedure, a History and Physical                            was performed, and patient medications and                            allergies were reviewed. The patient is competent.                            The risks and benefits of the procedure and the                            sedation options and risks were discussed with the                            patient. All questions were answered and informed                            consent was obtained. Patient identification and                            proposed procedure were verified by the physician                            in the pre-procedure area in the procedure room.                            Mental Status  Examination: alert and oriented.                            Prophylactic Antibiotics: The patient does not                            require prophylactic antibiotics. Prior                            Anticoagulants: The patient has taken Coumadin                            (warfarin). ASA Grade Assessment: IV - A patient                            with severe systemic disease that is  a constant                            threat to life. After reviewing the risks and                            benefits, the patient was deemed in satisfactory                            condition to undergo the procedure. The anesthesia                            plan was to use monitored anesthesia care (MAC).                            Immediately prior to administration of medications,                            the patient was re-assessed for adequacy to receive                            sedatives. The heart rate, respiratory rate, oxygen                            saturations, blood pressure, adequacy of pulmonary                            ventilation, and response to care were monitored                            throughout the procedure. The physical status of                            the patient was re-assessed after the procedure.                           After obtaining informed consent, the colonoscope  was passed under direct vision. Throughout the                            procedure, the patient's blood pressure, pulse, and                            oxygen saturations were monitored continuously. The                            EC-[id number] ([id number]) scope was introduced through                            the anus and advanced to the 2 cm into the ileum.                            The colonoscopy was performed without difficulty.                            The patient tolerated the procedure well. The                            quality of the bowel preparation was poor but                            adequate to identify polyps 6 mm and larger in                            size. Aggressive suctioning and aspiration was                            performed. The suction channel of the scope got                            clogged a few times. Retroflexed examination was                            performed and the ascending colon and in the                             rectum. Approximately 85-90% of the colonic mucosa                            was visualized satisfactorily. Scope In: 10:13:44 AM Scope Out: 10:38:23 AM Scope Withdrawal Time: 0 hours 15 minutes 52 seconds  Total Procedure Duration: 0 hours 24 minutes 39 seconds  Findings:      The perianal and digital rectal examinations were normal.      The colon (entire examined portion) appeared normal. Retroflexed       examination was performed and the ascending colon and in the rectum.       Small internal hemorrhoids were noted.      The terminal ileum appeared normal. Impression:               - Small internal hemorrhoids.                           -  Otherwise normal colonoscopy to terminal ileum. Recommendation:           - Return patient to hospital ward.                           - Resume previous diet.                           - Continue present medications. Can resume Coumadin                            from today. Procedure Code(s):        --- Professional ---                           [phone], Colonoscopy, flexible; diagnostic, including                            collection of specimen(s) by brushing or washing,                            when performed (separate procedure) Diagnosis Code(s):        --- Professional ---                           D50.9, Iron deficiency anemia, unspecified CPT copyright 2017 American [employer] Association. All rights reserved. The codes documented in this report are preliminary and upon coder review may  be revised to meet current compliance requirements. Jackquline Denmark MD, MD 09/24/2017 10:44:41 AM This report has been signed electronically. Number of Addenda: 0

## 2017-09-24 NOTE — Anesthesia Preprocedure Evaluation (Signed)
Anesthesia Evaluation  Patient identified by MRN, date of birth, ID band Patient awake    Reviewed: Allergy & Precautions, H&P , NPO status , Patient's Chart, lab work & pertinent test results  Airway Mallampati: II   Neck ROM: full    Dental   Pulmonary shortness of breath, former smoker,    breath sounds clear to auscultation       Cardiovascular hypertension, +CHF   Rhythm:regular Rate:Normal  TTE: EF 40-45%   Neuro/Psych  Headaches,    GI/Hepatic GI bleed   Endo/Other    Renal/GU Renal InsufficiencyRenal disease     Musculoskeletal   Abdominal   Peds  Hematology  (+) Blood dyscrasia, Sickle cell anemia and anemia ,   Anesthesia Other Findings   Reproductive/Obstetrics                             Anesthesia Physical Anesthesia Plan  ASA: III  Anesthesia Plan: MAC   Post-op Pain Management:    Induction: Intravenous  PONV Risk Score and Plan: 1 and Propofol infusion and Treatment may vary due to age or medical condition  Airway Management Planned: Nasal Cannula  Additional Equipment:   Intra-op Plan:   Post-operative Plan:   Informed Consent: I have reviewed the patients History and Physical, chart, labs and discussed the procedure including the risks, benefits and alternatives for the proposed anesthesia with the patient or authorized representative who has indicated his/her understanding and acceptance.     Plan Discussed with: CRNA and Anesthesiologist  Anesthesia Plan Comments:         Anesthesia Quick Evaluation

## 2017-09-24 NOTE — Progress Notes (Signed)
Family Medicine Teaching Service Daily Progress Note Intern Pager: [phone]  Patient name: Patrick [name] Medical record number: [id number] Date of birth: 03/28/1956 Age: 62 y.o. Gender: male  Primary Care Provider: [name], MD Consultants: Gastroenterology Code Status: Full (confirmed on admission)  Pt Overview and Major Events to Date:  Patrick [name] is a 62 y.o. male presenting with 1 day of hematemesis. PMH is significant for sickle cell anemia, HFmrEF, CKD stage V, visually impaired, atrial fibrillation, gout.    4/3 - s/p 3 pRBC  4/4 - EGD  4/8 - Hig[place] 4/9 - Planned colonoscipy  Assessment and Plan:  Upper GI Bleed, stable EGD showed erosive gastritis. Biopsied with path pending. No active bleeding. Oral protonix 40 mg qd, Planned inpatient colonoscopy on 4/11, completed  Prep, currently  NPO since midnight -Pending colonoscopy results -GI recommendations appreciated  Symptomatic cholelithiasis, improved HIDA scan unremarkable. CT abdomen showed cholelithiasis, but no acute processes. Abdominal pain improved -General surgery has signed off  ABLA/Sickle Cell Anemia, improved Stable Hgb of 8 with AM labs.  -monitor CBC -cont hydroxyurea and folate   Atrial fibrillation, rate controlled GI has ok'd restarting warfarin as Hg stable and no signs of active bleeding in the setting of bleeding, discussed risk and benefits with patient regarding anticoagulation (Stroke vs major bleeding event). Will not restart anticoagulation during this hospitalization. Patient wishes to discuss with cardiologist on discharge  -Continue Coreg -Holding warfarin -Telemetry    HFmrEF Last echo 07/2017. EF 40-45% G1DD.  No evidence of volume. At home Takes Coreg 12.5 twice daily, hydralazine 50 mg twice daily, torsemide 100 mg daily, Imdur 60 mg daily.  Weight stable -currently holding IMDUR, hydralazine and Torsemide in setting of low normal BP -Daily weights and strict  I's and O's -We will have patient restart these in setting of PCP monitoring  CKD V, stable Cr 4.2 > 3.6.  -Daily BMP -Strict I's and O's  Hypokalemia, resolved  -Monitor with BMP FEN/GI: renal/carb mod Prophylaxis: SCDs  Disposition: likely home today after colonoscopy Subjective:  No complaints of abdominal pain, chest pain, shortness of breath.  Objective: Temp:  [97.6 F (36.4 C)-97.7 F (36.5 C)] 97.6 F (36.4 C) (04/08 1626) Pulse Rate:  [71-77] 77 (04/09 0826) Resp:  [11-17] 14 (04/09 0423) BP: (107-133)/(70-88) 130/87 (04/09 0826) SpO2:  [99 %-100 %] 99 % (04/09 0423) Weight:  [142 lb 13.7 oz (64.8 kg)] 142 lb 13.7 oz (64.8 kg) (04/09 0500)   Physical Exam: Gen:  NAD, resting comfortably CV: RRR with no MRG Pulm: Normal effort, clear to auscultation bilaterally, no wheeze GI: nontender no rebound or guarding present, MSK: No edema or tenderness Skin: warm, dry, no rash Neuro: Alert, oriented x3, grossly normal, moves all extremities Psych: Normal mood and affect  Laboratory: Recent Labs  Lab 09/22/17 0416 09/23/17 0307 09/24/17 0410  WBC 9.8 9.0 9.1  HGB 8.0* 8.2* 8.5*  HCT 23.9* 24.4* 25.9*  PLT 140* 148* 161   Recent Labs  Lab 09/20/17 0701  09/22/17 0416 09/22/17 0747 09/23/17 0307 09/24/17 0410  NA 135   < > 137  --  138 138  K 3.2*   < > 4.2  --  4.4 4.1  CL 100*   < > 104  --  105 104  CO2 22   < > 23  --  22 23  BUN 121*   < > 84*  --  78* 66*  CREATININE 4.48*   < > 4.15*  --  4.26* 3.63*  CALCIUM 8.7*   < > 8.9  --  8.9 9.0  PROT 6.5  --   --  6.3* 6.2*  --   BILITOT 1.6*  --   --  1.2 1.2  --   ALKPHOS 35*  --   --  41 38  --   ALT 19  --   --  16* 14*  --   AST 25  --   --  22 19  --   GLUCOSE 110*   < > 101*  --  94 97   < > = values in this interval not displayed.  Bonnita Hollow, MD 09/24/2017, 9:13 AM PGY-1, [place] Intern pager: [phone], text pages welcome

## 2017-09-24 NOTE — Progress Notes (Signed)
Ido D Seelman to be D/C'd home per MD order. Discussed with the patient and significant other all questions fully answered. VVS, Skin clean, dry and intact without evidence of skin break down, no evidence of skin tears noted.  IV catheter discontinued intact. Site without signs and symptoms of complications. Dressing and pressure applied.  An After Visit Summary and doctor's note for work were printed and given to the patient.  Patient escorted via [place], and D/C home via private auto.  Melonie Florida  09/24/2017 4:03 PM

## 2017-09-24 NOTE — Transfer of Care (Signed)
Immediate Anesthesia Transfer of Care Note  Patient: Patrick [name]  Procedure(s) Performed: COLONOSCOPY WITH PROPOFOL (N/A )  Patient Location: Endoscopy Unit  Anesthesia Type:MAC  Level of Consciousness: sedated and patient cooperative  Airway & Oxygen Therapy: Patient Spontanous Breathing  Post-op Assessment: Report given to RN and Post -op Vital signs reviewed and stable  Post vital signs: Reviewed and stable  Last Vitals:  Vitals Value Taken Time  BP    Temp    Pulse    Resp    SpO2      Last Pain:  Vitals:   09/24/17 0924  TempSrc: Oral  PainSc: 0-No pain      Patients Stated Pain Goal: 0 (38/45/36 4680)  Complications: No apparent anesthesia complications

## 2017-09-24 NOTE — Anesthesia Procedure Notes (Signed)
Procedure Name: MAC Date/Time: 09/24/2017 10:11 AM Performed by: Lance Coon, CRNA Pre-anesthesia Checklist: Patient identified, Emergency Drugs available, Suction available, Patient being monitored and Timeout performed Patient Re-evaluated:Patient Re-evaluated prior to induction Oxygen Delivery Method: Nasal cannula

## 2017-09-24 NOTE — Discharge Summary (Signed)
[facility] Discharge Summary  Patient name: Patrick [name] Medical record number: [id number] Date of birth: Nov 30, 1955 Age: 62 y.o. Gender: male Date of Admission: 09/18/2017  Date of Discharge: 09/24/2017  Admitting Physician: Leeanne Rio, MD  Primary Care Provider: [name], MD Consultants: Gastroenterology,   Indication for Hospitalization: Upper GI Bleed and Profound Anemia  Discharge Diagnoses/Problem List:  Patient Active Problem List   Diagnosis Date Noted  . Acute GI bleeding 09/19/2017  . Symptomatic anemia   . Chronic heart failure with preserved ejection fraction ([place])   . Colon cancer screening 07/16/2017  . Encounter for therapeutic drug monitoring 12/29/2015  . 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  . Atrial fibrillation ([place]) 12/24/2015  . Cough with sputum 10/25/2015  . Muscle tightness-Right arm  05/27/2014  . Arm pain, right 05/06/2014  . End stage renal disease ([place]) 05/06/2014  . Chest pain 01/13/2014  . Vitamin D deficiency 09/25/2013  . CKD (chronic kidney disease), stage IV ([place]) 09/25/2013  . Elevated uric acid in blood 09/25/2013  . Hb-SS disease without crisis ([place]) 09/25/2013  . Anemia 09/25/2013  . Onychomycosis of toenail 08/27/2013  . Cellulitis 08/27/2013  . Hyperglycemia 08/27/2013  . CHF (congestive heart failure) ([place]) 08/19/2013  . Sickle cell anemia ([place]) 11/21/2012  . Abdominal pain 01/17/2012  . Tobacco abuse 10/29/2011  . NSVT (nonsustained ventricular tachycardia) ([place]) 10/21/2011  . Gout 10/19/2011  . Sickle cell crisis ([place]) 10/17/2011  . HTN (hypertension) 06/07/2011   Disposition: Home  Discharge Condition: Improved  Discharge Exam:  Gen:  NAD, resting comfortably CV: RRR with no MRG Pulm: Normal effort, clear to auscultation bilaterally, no wheeze GI: nontender no rebound or guarding present, MSK: No edema or tenderness Skin: warm, dry, no rash Neuro: Alert, oriented x3,  grossly normal, moves all extremities Psych: Normal mood and affect  Brief Hospital Course:  Patrick [name] is a 62 y.o. male who presented with hematemesis and Hg of 4.6 with supratherapeutic INR 3.8 on Warfarin. Patient was transfused with pRBC x3 and INR was reversed with KCentra and vitamin K. Post transfusion Hemoglobin normalized to baseline of 8.0. GI was consulted and started IV PPI and performed EGD and colonoscopy. EGD showed nonbleeding gastritis, biopsies were obtained. Pathology was pending at discharge. Colonoscopy showed small internal hemorrhoids, otherwise normal. Patient also had slightly elevated lipase and mild RUQ and epigastric pain on admission. Abdominal US and CT abdomen showed chololithiasis and common bile duct dilation. Patient was afebrile and had no leukocytosis. General Surgery was consulted and recommended a HIDA scan. It showed grossly normal GB function. By time of discharge, abdominal pain had resolved. During admission, patient anticoagulation was held in the setting of GI bleed. After normalization of Hg and evaluation by GI for no actively bleeding source on endoscopy, GI said it was ok to resume anticoagulation. Risk and Benefit of anticoagulation in the setting of atrial fibrillation vs GI bleeding was discussed with patient including numerical risk calculations with CHADVasc2 of 3 and HASBLED 3. Patient declined to restart anticoagulation during inpatient setting and wanted to follow up with his cardiologist in out patient setting.   Issues for Follow Up:  1. Restarting antihypertensives: Patient had low normal blood pressures during admission. The following home antihypertensives were held on discharge: hydralazine, Imdur, torsemide. Please restart as needed 2. Anticoagulation: As noted above, anticoagulation was held due to patient preference. Please readdress in outpatient visit.   Significant Procedures:  1. EGD on 09/19/17 2.  Colonoscopy on  09/24/17  Significant Labs and Imaging:  Recent Labs  Lab 09/22/17 0416 09/23/17 0307 09/24/17 0410  WBC 9.8 9.0 9.1  HGB 8.0* 8.2* 8.5*  HCT 23.9* 24.4* 25.9*  PLT 140* 148* 161   Recent Labs  Lab 09/18/17 2140 09/19/17 0716 09/20/17 0701 09/21/17 0154 09/22/17 0416 09/22/17 0747 09/23/17 0307 09/24/17 0410  NA  --   --  135 136 137  --  138 138  K  --   --  3.2* 3.5 4.2  --  4.4 4.1  CL  --   --  100* 103 104  --  105 104  CO2  --   --  22 23 23   --  22 23  GLUCOSE  --   --  110* 111* 101*  --  94 97  BUN  --   --  121* 103* 84*  --  78* 66*  CREATININE  --   --  4.48* 4.33* 4.15*  --  4.26* 3.63*  CALCIUM  --   --  8.7* 8.9 8.9  --  8.9 9.0  MG  --  2.2  --   --   --   --   --   --   PHOS  --  3.3  --   --   --   --   --   --   ALKPHOS 36*  --  35*  --   --  41 38  --   AST 29  --  25  --   --  22 19  --   ALT 25  --  19  --   --  16* 14*  --   ALBUMIN 3.7  --  3.4*  --   --  3.3* 3.3*  --     Discharge Medications:  Allergies as of 09/24/2017   No Known Allergies     Medication List    STOP taking these medications   aspirin EC 81 MG tablet   hydrALAZINE 50 MG tablet Commonly known as:  APRESOLINE   isosorbide mononitrate 60 MG 24 hr tablet Commonly known as:  IMDUR   sodium bicarbonate 650 MG tablet   torsemide 100 MG tablet Commonly known as:  DEMADEX   warfarin 5 MG tablet Commonly known as:  COUMADIN     TAKE these medications   allopurinol 100 MG tablet Commonly known as:  ZYLOPRIM Take 1 tablet (100 mg total) by mouth daily. What changed:  when to take this   calcitRIOL 0.25 MCG capsule Commonly known as:  ROCALTROL Take 0.25 mcg by mouth daily.   carvedilol 12.5 MG tablet Commonly known as:  COREG Take 1 tablet (12.5 mg total) by mouth 2 (two) times daily with a meal.   folic acid 1 MG tablet Commonly known as:  FOLVITE TAKE 1 TABLET (1 MG TOTAL) BY MOUTH DAILY.   hydroxyurea 500 MG capsule Commonly known as:  HYDREA TAKE ONE  CAPSULE EVERY DAY WITH FOOD TO MINIMIZE GI SIDE EFFECTS   pantoprazole 40 MG tablet Commonly known as:  PROTONIX Take 1 tablet (40 mg total) by mouth daily at 6 (six) AM.       Discharge Instructions: Please refer to Patient Instructions section of EMR for full details.  Patient was counseled important signs and symptoms that should prompt return to medical care, changes in medications, dietary instructions, activity restrictions, and follow up appointments.   Follow-Up Appointments: Follow-up Information    [place],  Olugbemiga E, MD. Schedule an appointment as soon as possible for a visit in 1 week(s).   Specialty:  Internal Medicine Contact information: [place] [address] [phone]        Bensimhon, Shaune Pascal, MD. Schedule an appointment as soon as possible for a visit.   Specialty:  Cardiology Why:  To discuss anticoagulation Contact information: [address] [address] [phone]           Bonnita Hollow, MD 09/25/2017, 2:06 PM PGY-1, [name]

## 2017-09-24 NOTE — Interval H&P Note (Signed)
History and Physical Interval Note:  09/24/2017 10:04 AM  Patrick [name]  has presented today for surgery, with the diagnosis of GI bleeding  The various methods of treatment have been discussed with the patient and family. After consideration of risks, benefits and other options for treatment, the patient has consented to  Procedure(s): COLONOSCOPY WITH PROPOFOL (N/A) as a surgical intervention .  The patient's history has been reviewed, patient examined, no change in status, stable for surgery.  I have reviewed the patient's chart and labs.  Questions were answered to the patient's satisfaction.     Jackquline Denmark

## 2017-09-25 NOTE — Anesthesia Postprocedure Evaluation (Signed)
Anesthesia Post Note  Patient: Patrick [name]  Procedure(s) Performed: COLONOSCOPY WITH PROPOFOL (N/A )     Patient location during evaluation: Endoscopy Anesthesia Type: MAC Level of consciousness: awake and alert Pain management: pain level controlled Vital Signs Assessment: post-procedure vital signs reviewed and stable Respiratory status: spontaneous breathing, nonlabored ventilation, respiratory function stable and patient connected to nasal cannula oxygen Cardiovascular status: stable and blood pressure returned to baseline Postop Assessment: no apparent nausea or vomiting Anesthetic complications: no    Last Vitals:  Vitals:   09/24/17 1050 09/24/17 1318  BP: 113/78 123/88  Pulse:  74  Resp: 20 20  Temp:  36.6 C  SpO2: 100% 100%    Last Pain:  Vitals:   09/24/17 1318  TempSrc: Oral  PainSc:                  Dendron S

## 2017-09-26 ENCOUNTER — Encounter (HOSPITAL_COMMUNITY): Payer: Self-pay | Admitting: Gastroenterology

## 2017-10-10 ENCOUNTER — Encounter (HOSPITAL_COMMUNITY): Payer: Self-pay | Admitting: Internal Medicine

## 2017-10-10 ENCOUNTER — Ambulatory Visit (INDEPENDENT_AMBULATORY_CARE_PROVIDER_SITE_OTHER): Payer: PPO | Admitting: Family Medicine

## 2017-10-10 ENCOUNTER — Ambulatory Visit (HOSPITAL_COMMUNITY)
Admission: RE | Admit: 2017-10-10 | Discharge: 2017-10-10 | Disposition: A | Discharge status: Home / Self Care | Payer: PPO | Source: Ambulatory Visit | Attending: Internal Medicine | Admitting: Internal Medicine

## 2017-10-10 ENCOUNTER — Encounter: Payer: Self-pay | Admitting: Family Medicine

## 2017-10-10 ENCOUNTER — Other Ambulatory Visit: Payer: Self-pay

## 2017-10-10 VITALS — BP 144/84 | HR 87 | Temp 97.3°F | Resp 16 | Ht 71.0 in | Wt 143.0 lb

## 2017-10-10 VITALS — BP 140/80 | HR 92 | Wt 141.8 lb

## 2017-10-10 DIAGNOSIS — I130 Hypertensive heart and chronic kidney disease with heart failure and stage 1 through stage 4 chronic kidney disease, or unspecified chronic kidney disease: Secondary | ICD-10-CM | POA: Insufficient documentation

## 2017-10-10 DIAGNOSIS — I4891 Unspecified atrial fibrillation: Secondary | ICD-10-CM | POA: Diagnosis not present

## 2017-10-10 DIAGNOSIS — K648 Other hemorrhoids: Secondary | ICD-10-CM | POA: Diagnosis not present

## 2017-10-10 DIAGNOSIS — D571 Sickle-cell disease without crisis: Secondary | ICD-10-CM

## 2017-10-10 DIAGNOSIS — H548 Legal blindness, as defined in USA: Secondary | ICD-10-CM | POA: Insufficient documentation

## 2017-10-10 DIAGNOSIS — I5022 Chronic systolic (congestive) heart failure: Secondary | ICD-10-CM | POA: Diagnosis not present

## 2017-10-10 DIAGNOSIS — N184 Chronic kidney disease, stage 4 (severe): Secondary | ICD-10-CM

## 2017-10-10 DIAGNOSIS — F17210 Nicotine dependence, cigarettes, uncomplicated: Secondary | ICD-10-CM | POA: Diagnosis not present

## 2017-10-10 DIAGNOSIS — I10 Essential (primary) hypertension: Secondary | ICD-10-CM

## 2017-10-10 DIAGNOSIS — I480 Paroxysmal atrial fibrillation: Secondary | ICD-10-CM | POA: Insufficient documentation

## 2017-10-10 DIAGNOSIS — M109 Gout, unspecified: Secondary | ICD-10-CM | POA: Insufficient documentation

## 2017-10-10 DIAGNOSIS — I428 Other cardiomyopathies: Secondary | ICD-10-CM | POA: Insufficient documentation

## 2017-10-10 DIAGNOSIS — Z8249 Family history of ischemic heart disease and other diseases of the circulatory system: Secondary | ICD-10-CM | POA: Diagnosis not present

## 2017-10-10 DIAGNOSIS — I5042 Chronic combined systolic (congestive) and diastolic (congestive) heart failure: Secondary | ICD-10-CM | POA: Insufficient documentation

## 2017-10-10 DIAGNOSIS — Z79899 Other long term (current) drug therapy: Secondary | ICD-10-CM | POA: Diagnosis not present

## 2017-10-10 LAB — POCT URINALYSIS DIPSTICK
Bilirubin, UA: NEGATIVE
Glucose, UA: NEGATIVE
Ketones, UA: NEGATIVE
Leukocytes, UA: NEGATIVE
Nitrite, UA: NEGATIVE
Protein, UA: 100
Spec Grav, UA: 1.015 (ref 1.010–1.025)
Urobilinogen, UA: 0.2 E.U./dL
pH, UA: 6.5 (ref 5.0–8.0)

## 2017-10-10 MED ORDER — OXYCODONE HCL 5 MG PO CAPS: 5.0000 mg | ORAL_CAPSULE | Freq: Four times a day (QID) | ORAL | 0 refills | Status: DC | PRN

## 2017-10-10 MED ORDER — FOLIC ACID 1 MG PO TABS: ORAL_TABLET | ORAL | 11 refills | Status: DC

## 2017-10-10 MED ORDER — APIXABAN 5 MG PO TABS: 5.0000 mg | ORAL_TABLET | Freq: Two times a day (BID) | ORAL | 6 refills | Status: DC

## 2017-10-10 MED ORDER — HYDRALAZINE HCL 25 MG PO TABS: 25.0000 mg | ORAL_TABLET | Freq: Two times a day (BID) | ORAL | 6 refills | Status: DC

## 2017-10-10 MED ORDER — CARVEDILOL 12.5 MG PO TABS: 12.5000 mg | ORAL_TABLET | Freq: Two times a day (BID) | ORAL | 6 refills | Status: DC

## 2017-10-10 MED ORDER — ISOSORBIDE MONONITRATE ER 30 MG PO TB24: 30.0000 mg | ORAL_TABLET | Freq: Every day | ORAL | 6 refills | Status: DC

## 2017-10-10 MED ORDER — HYDROXYUREA 500 MG PO CAPS: ORAL_CAPSULE | ORAL | 11 refills | Status: DC

## 2017-10-10 NOTE — Progress Notes (Signed)
Subjective:    Patient ID: Patrick [name], male    DOB: 01-29-1956, 62 y.o.   MRN: [id number]  HPI Patrick [name], a 62 year old male with a history of sickle cell anemia presents for a follow up of chronic conditions.  Patrick [name] was a patient here several years ago but has been lost to follow up. He continues to follow up with Dr. Lorrene Reid, nephrology for stage 3 kidney disease. Patient has been mostly compliant with Hydrea 450 mg daily and folic acid 1 mg/day.  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  He is followed by Dr. Zadie Rhine ophthalmology, for his vision problems.  Patient is complaining of pain with deep breathing over the past 3 days.   Patient is complaining of pain to left lower extremity over the past several days. . Pain is primarily to right foot characterized as constant and throbbing. Pain is aggravated by full weight bearing.  He is not attempted any over-the-counter interventions to alleviate current symptoms.  Current pain intensity is 6-7/10 described as intermittent and aching.  Patient denies fatigue, shortness of breath, heart palpitations, dysuria, nausea, vomiting, or diarrhea.  Past Medical History:  Diagnosis Date  . Blood dyscrasia   . Blood transfusion    "I've had a bunch of them"  . CHF (congestive heart failure) ([place])    followed by hf clinic  . Chronic kidney disease (CKD), stage III (moderate) (HCC)   . Gout   . TUUEKCMK(349.1)    "probably weekly" (01/13/2014)  . Legally blind   . Shortness of breath dyspnea   . Sickle cell disease, type SS ([place])   . Swelling abdomen   . Swelling of extremity, left   . Swelling of extremity, right    Social History   Socioeconomic History  . Marital status: Divorced    Spouse name: Not on file  . Number of children: Not on file  . Years of education: Not on file  . Highest education level: Not on file  Occupational History  . Not on file   Social Needs  . Financial resource strain: Not on file  . Food insecurity:    Worry: Not on file    Inability: Not on file  . Transportation needs:    Medical: Not on file    Non-medical: Not on file  Tobacco Use  . Smoking status: Former Smoker    Packs/day: 0.25    Years: 41.00    Pack years: 10.25    Types: Cigarettes    Last attempt to quit: 12/08/2015    Years since quitting: 1.8  . Smokeless tobacco: Never Used  Substance and Sexual Activity  . Alcohol use: Yes    Alcohol/week: 1.8 oz    Types: 1 Glasses of wine, 2 Cans of beer per week    Comment: occasional  . Drug use: No  . Sexual activity: Not Currently  Lifestyle  . Physical activity:    Days per week: Not on file    Minutes per session: Not on file  . Stress: Not on file  Relationships  . Social connections:    Talks on phone: Not on file    Gets together: Not on file    Attends religious service: Not on file    Active member of club or organization: Not on file    Attends meetings of clubs or organizations: Not on file    Relationship  status: Not on file  . Intimate partner violence:    Fear of current or ex partner: Not on file    Emotionally abused: Not on file    Physically abused: Not on file    Forced sexual activity: Not on file  Other Topics Concern  . Not on file  Social History Narrative  . Not on file   Review of Systems  Constitutional: Negative.   HENT: Negative.   Eyes: Negative.        Legally blind  Respiratory: Negative.  Negative for apnea, shortness of breath and wheezing.        Discomfort on inspiration  Gastrointestinal: Negative.   Endocrine: Negative for polydipsia, polyphagia and polyuria.  Genitourinary: Negative.   Musculoskeletal: Positive for arthralgias (left foot) and myalgias.  Skin: Negative.   Neurological: Negative.   Hematological: Negative.   Psychiatric/Behavioral: Negative.        Objective:   Physical Exam  Constitutional: He is oriented to  person, place, and time. He appears well-developed and well-nourished.  HENT:  Head: Normocephalic and atraumatic.  Right Ear: External ear normal.  Left Ear: External ear normal.  Nose: Nose normal.  Mouth/Throat: Oropharynx is clear and moist.  Eyes: Pupils are equal, round, and reactive to light. Conjunctivae are normal.  Neck: Normal range of motion. Neck supple.  Cardiovascular: Normal rate, regular rhythm, normal heart sounds and intact distal pulses.  Pulmonary/Chest: Effort normal and breath sounds normal.  Abdominal: Soft. Bowel sounds are normal.  Musculoskeletal:       Left ankle: He exhibits decreased range of motion. He exhibits no swelling.       Cervical back: He exhibits decreased range of motion and pain.  Neurological: He is alert and oriented to person, place, and time. He has normal reflexes.  Skin: Skin is warm and dry.  Psychiatric: He has a normal mood and affect. His behavior is normal. Judgment and thought content normal.   BP (!) 144/84 (BP Location: Left Arm, Patient Position: Sitting, Cuff Size: Normal)   Pulse 87   Temp (!) 97.3 F (36.3 C) (Oral)   Resp 16   Ht 5\' 11"  (1.803 m)   Wt 143 lb (64.9 kg)   SpO2 100%   BMI 19.94 kg/m     Assessment & Plan:  Sickle cell crisis (HCC)   Continue Hydrea 500 mg daily. We discussed the need for good hydration, monitoring of hydration status, avoidance of heat, cold, stress, and infection triggers. We discussed the risks and benefits of Hydrea, including bone marrow suppression, the possibility of GI upset, skin ulcers, hair thinning, and teratogenicity. The patient was reminded of the need to seek medical attention of any symptoms of bleeding, anemia, or infection. Continue folic acid 1 mg daily to prevent aplastic bone marrow crises.   Pulmonary evaluation - Patient denies severe recurrent wheezes, shortness of breath with exercise, or persistent cough. If these symptoms develop, pulmonary function tests with  spirometry will be ordered, and if abnormal, plan on referral to Pulmonology for further evaluation.   Cardiac -patient is followed by cardiology, he is to follow-up as scheduled.  Eye -legally blind, patient is to schedule follow-up with ophthalmology   Immunization status -up-to-date with immunizations  Acute and chronic painful episodes - We agreed on Percocet 10-325 mg every 6 hours as needed for moderate to severe pain.  We discussed that pt is to receive his schedule II prescriptions only from [place]. Pt is also aware that the  prescription history is available to [place] online through the PMP Aware Controlled substance agreement signed 05/31/2017.  We reminded Mr. Bisping that all patients receiving Schedule II narcotics must be seen for follow within one month of prescription being requested. We reviewed the terms of our pain agreement, including the need to keep medicines in a safe locked location away from children or pets, and the need to report excess sedation or constipation, measures to avoid constipation, and policies related to early refills and stolen prescriptions. According to the [place] [facility] program, we have reviewed details related to analgesia, adverse effects, aberrant behaviors. - Comprehensive metabolic panel - CBC with Differential - Reticulocytes - hydroxyurea (HYDREA) 500 MG capsule; TAKE ONE CAPSULE EVERY DAY WITH FOOD TO MINIMIZE GI SIDE EFFECTS  Dispense: 30 capsule; Refill: 11 - folic acid (FOLVITE) 1 MG tablet; TAKE 1 TABLET (1 MG TOTAL) BY MOUTH DAILY.  Dispense: 30 tablet; Refill: 11 - oxycodone (OXY-IR) 5 MG capsule; Take 1 capsule (5 mg total) by mouth every 6 (six) hours as needed.  Dispense: 30 capsule; Refill: 0  2. Hypertension, unspecified type - Urinalysis Dipstick  3. CKD (chronic kidney disease), stage IV (HCC) Continue to follow up with Dr. Lorrene Reid as scheduled. Will fax laboratory values following review.   RTC: 1 month for medication  management   [name]  MSN, FNP-C [facility] [address] [place], [place] [address] [phone]

## 2017-10-10 NOTE — Progress Notes (Signed)
ba

## 2017-10-10 NOTE — Progress Notes (Signed)
Patient ID: Patrick [name], male   DOB: 15-Aug-1955, 62 y.o.   MRN: [id number]     Advanced Heart Failure Clinic Note   PCP: Patrick [name] : Patrick [name]  Opthalmologist: Patrick [name] HF MD: Patrick [name]   HPI: Patrick [name] is a 62 y.o. male  with a history of sickle [name] anemia, systolic HF due to NICM EF 40% 11/2012 initially diagnosed 05/2011, no CAD by cath 05/2011, CKD stage III, legally blind, and tobacco abuse.   Admitted 11/18/12 from HF clinic with voume overload. Hemoglobin 6.7 Transfused 2 UPRBC. Diuresed with IV lasix. Lisinopril stopped due to CKD. Discharge weight 143 pounds.    Admitted to [facility] 3/4 through 08/22/13 with anemia and volume overload. Hemglobin was 5.9. Received 1U PRBC with hemoglobin increased to 6.4 Diuresed with IV lasix. Discharged on carvedilol 25 mg twice a day, lasix 80 mg twice a day, hydralazine 25 mg tid, and imdur 30 mg daily.  D/C weight was 140 pounds.  He is not on Spironolactone due to hyperkalemia. He is not on ace inhibitor due to renal failure.  Admitted 7/6 through 2/72/5366 with A/C systolic heart failure and CKD in setting of new A fib. Started coumadin. Discharge weight was 135 pounds.   Recently admitted 4/3 - 09/24/17 with upper GI bleed and profound anemia with Hgb of 4.6 with Sickle [name]. Transfused with RBCs. EGD showed nonbleeding gastritis, biopsies were obtained. Pathology was pending at discharge. Colonoscopy showed small internal hemorrhoids, otherwise normal. Pt refused resumption of his AC and wished for follow up with his cardiologist prior to starting. Hydralazine, Imdur, and Torsemide were STOPPED.  He presents today for follow up. Last seen in HF clinic 03/2016. Feeling better since hospitalization. He did restart his torsemide due to weight gain and swelling. He has taken 100 mg BID for the past week. Edema now resolved. He is still active. Denies bleeding. Sickle [name] is relatively well controlled. Has very infrequent  crises. Denies SOB on flat ground or ADLs. No orthopnea, PND or CP.   Echo 07/2017 LVEF 40-45%, Grade 1 DD, Mod MR, Severe LAE, PA peak pressure 34 mm Hg.   01/17/12 ECHO 55%-60%  11/20/12 ECHO EF 40% Peak PA pressure 56 11/30/13 ECHO 40-45%  12/2015: ECHO 40%.   Labs        02/17/13: K+ 5.0, BUN 45, Creatinine 2.2, proBNP 10135       05/21/13 K 5.4 Creatinine 2.25 Pro BNP 3076        06/09/13 K 4.6 Creatinine 2.1 Pro BNP 10,727        07/07/13 Creatinine 2.06 K 5.0         08/19/13 Creatinine 2.5 Hemoglobin 5.9 Pro BNP 8266         08/27/13 K 4.5 Creatinine 2.78 Hemoglobin 7.2        09/21/13 K 4.2, creatinine 2.56         01/14/14 K 4.7, creatinine 2.29        03/08/14 K 4.8 Creatinine 2.17         12/27/2015: K 3.9 Creatinine 4.05  SH: Lives with his cousin in [place]. Smokes 8 cigarettes per day. Occasional ETOH and no drugs  FH: Mother deceased: liver failure, no CAD        Father deceased: CAD, MI  Review of systems complete and found to be negative unless listed in HPI.    Past Medical History:  Diagnosis Date  . Blood dyscrasia   .  Blood transfusion    "I've had a bunch of them"  . CHF (congestive heart failure) ([place])    followed by hf clinic  . Chronic kidney disease (CKD), stage III (moderate) (HCC)   . Gout   . GNFAOZHY(865.7)    "probably weekly" (01/13/2014)  . Legally blind   . Shortness of breath dyspnea   . Sickle [name] disease, type SS ([place])   . Swelling abdomen   . Swelling of extremity, left   . Swelling of extremity, right     Current Outpatient Medications  Medication Sig Dispense Refill  . allopurinol (ZYLOPRIM) 100 MG tablet Take 1 tablet (100 mg total) by mouth daily. (Patient taking differently: Take 100 mg by mouth 2 (two) times daily. ) 90 tablet 0  . calcitRIOL (ROCALTROL) 0.25 MCG capsule Take 0.25 mcg by mouth daily.     . carvedilol (COREG) 12.5 MG tablet Take 1 tablet (12.5 mg total) by mouth 2 (two) times daily with a meal. 60 tablet 1  . folic acid  (FOLVITE) 1 MG tablet TAKE 1 TABLET (1 MG TOTAL) BY MOUTH DAILY. 30 tablet 11  . hydroxyurea (HYDREA) 500 MG capsule TAKE ONE CAPSULE EVERY DAY WITH FOOD TO MINIMIZE GI SIDE EFFECTS 30 capsule 11  . pantoprazole (PROTONIX) 40 MG tablet Take 1 tablet (40 mg total) by mouth daily at 6 (six) AM. 30 tablet 0   No current facility-administered medications for this visit.    Vitals:   10/10/17 1221  BP: 140/80  Pulse: 92  SpO2: 100%  Weight: 141 lb 12 oz (64.3 kg)     Wt Readings from Last 3 Encounters:  10/10/17 141 lb 12 oz (64.3 kg)  09/24/17 142 lb (64.4 kg)  07/16/17 148 lb (67.1 kg)   PHYSICAL EXAM: General: Well appearing. No resp difficulty. HEENT: Normal anicteric Neck: Supple. JVP 5-6. Carotids 2+ bilat; no bruits. No thyromegaly or nodule noted. Cor: PMI nondisplaced. RRR, 2/6 TR and 2/6 MR  Lungs: CTAB, normal effort. No wheeze Abdomen: Soft, non-tender, non-distended, no HSM. No bruits or masses. +BS  Extremities: No cyanosis or rash. + clubbing. RUE large AVF  +thrill and bruit. No edema  Neuro: alert & oriented x 3, cranial nerves grossly intact. moves all 4 extremities w/o difficulty. Affect pleasant   ASSESSMENT & PLAN:  1. Chronic Combined Heart Failure:  NICM. Cath 05/2011 without significant coronary disease.  ECHO 12/2015 40%. Echo 2/19 EF 40-45% - NYHA II - Volume status stable on exam.  - Continue torsemide 100 mg daily.   -Continue coreg 12.5 BID. - Resume hydralazine at 25 mg BID and Imdur 30 mg daily.  - He is not on Ace/Arb/spiro due to hyperkalemia/CKD - Reinforced fluid restriction to < 2 L daily, sodium restriction to less than 2000 mg daily, and the importance of daily weights.   2. HTN: - Meds as above.  3  Sickle [name]:   - Follows closely with [name].  4. Former smoker:   - Stopped 11/2015.  5. CKD stable IV:  - Followed by Patrick. Lorrene [name].  - Has RUE Fistula.  6. PAF: CHADSVASC This patients CHA2DS2-VASc is at least 3.  - EKG shows  NSR today.  - Data reviewed and Eliquis use is appropriate in Sickle [name] patients, and shows decreased rates bleeding compared to coumadin, similar to Non-Sickle [name] patients.  7. GI bleeding: - EGD showed nonbleeding gastritis, biopsies were obtained. Pathology was pending at discharge. Colonoscopy showed small internal hemorrhoids,  otherwise normal.  - Pt has refused resumption of coumadin at this time. Will trial on Eliquis as above.   Shirley Friar, PA-C  10/10/2017   Patient seen and examined with the above-signed Advanced Practice Provider and/or Housestaff. I personally reviewed laboratory data, imaging studies and relevant notes. I independently examined the patient and formulated the important aspects of the plan. I have edited the note to reflect any of my changes or salient points. I have personally discussed the plan with the patient and/or family.  Doing well after recent hospitalization for profound anemia. Volume status looks good after restarting torsemide. Will resume hydralazine and Imdur. Discussed risk/benefits of AC for PAF and we have agreed on trial of Eliquis. Renal function remains stable. Will continue to follow.   Glori Bickers, MD  10:10 PM

## 2017-10-10 NOTE — Patient Instructions (Signed)
Start Hydralazine 25 mg Twice daily   Start Imdur 30 mg daily  Start Eliquis 5 mg Twice daily, if this is too expensive please call and let us know  Your physician recommends that you schedule a follow-up appointment in: 4 months

## 2017-10-11 LAB — IMMATURE CELLS: Metamyelocytes: 1 % — ABNORMAL HIGH (ref 0–0)

## 2017-10-11 LAB — CBC WITH DIFFERENTIAL/PLATELET
Basophils Absolute: 0.1 10*3/uL (ref 0.0–0.2)
Basos: 1 %
EOS (ABSOLUTE): 0.3 10*3/uL (ref 0.0–0.4)
Eos: 6 %
Hematocrit: 28.5 % — ABNORMAL LOW (ref 37.5–51.0)
Hemoglobin: 9.1 g/dL — ABNORMAL LOW (ref 13.0–17.7)
Lymphocytes Absolute: 0.8 10*3/uL (ref 0.7–3.1)
Lymphs: 15 %
MCH: 31.3 pg (ref 26.6–33.0)
MCHC: 31.9 g/dL (ref 31.5–35.7)
MCV: 98 fL — ABNORMAL HIGH (ref 79–97)
Monocytes Absolute: 0.2 10*3/uL (ref 0.1–0.9)
Monocytes: 3 %
NRBC: 21 % — ABNORMAL HIGH (ref 0–0)
Neutrophils Absolute: 3.8 10*3/uL (ref 1.4–7.0)
Neutrophils: 74 %
Platelets: 216 10*3/uL (ref 150–379)
RBC: 2.91 x10E6/uL — ABNORMAL LOW (ref 4.14–5.80)
RDW: 24.7 % — ABNORMAL HIGH (ref 12.3–15.4)
WBC: 5.1 10*3/uL (ref 3.4–10.8)

## 2017-10-11 LAB — COMPREHENSIVE METABOLIC PANEL
ALT: 17 IU/L (ref 0–44)
AST: 36 IU/L (ref 0–40)
Albumin/Globulin Ratio: 1.3 (ref 1.2–2.2)
Albumin: 4.5 g/dL (ref 3.5–5.5)
Alkaline Phosphatase: 82 IU/L (ref 39–117)
BUN/Creatinine Ratio: 12 (ref 9–20)
BUN: 66 mg/dL — ABNORMAL HIGH (ref 6–24)
Bilirubin Total: 1.7 mg/dL — ABNORMAL HIGH (ref 0.0–1.2)
CO2: 23 mmol/L (ref 20–29)
Calcium: 9 mg/dL (ref 8.7–10.2)
Chloride: 96 mmol/L (ref 96–106)
Creatinine, Ser: 5.43 mg/dL — ABNORMAL HIGH (ref 0.76–1.27)
GFR calc Af Amer: 12 mL/min/{1.73_m2} — ABNORMAL LOW (ref >59)
GFR calc non Af Amer: 11 mL/min/{1.73_m2} — ABNORMAL LOW (ref >59)
Globulin, Total: 3.4 g/dL (ref 1.5–4.5)
Glucose: 92 mg/dL (ref 65–99)
Potassium: 3.7 mmol/L (ref 3.5–5.2)
Sodium: 138 mmol/L (ref 134–144)
Total Protein: 7.9 g/dL (ref 6.0–8.5)

## 2017-10-11 LAB — RETICULOCYTES: Retic Ct Pct: 7.3 % — ABNORMAL HIGH (ref 0.6–2.6)

## 2017-10-16 NOTE — Patient Instructions (Signed)
Continue Percocet 10-325 mg every 6 hours as needed for moderate to severe pain.  Increase water intake to 64 ounces per day.   Follow up with specialists as scheduled   Sickle Cell Anemia, Adult Sickle cell anemia is a condition where your red blood cells are shaped like sickles. Red blood cells carry oxygen through the body. Sickle-shaped red blood cells do not live as long as normal red blood cells. They also clump together and block blood from flowing through the blood vessels. These things prevent the body from getting enough oxygen. Sickle cell anemia causes organ damage and pain. It also increases the risk of infection. Follow these instructions at home:  Drink enough fluid to keep your pee (urine) clear or pale yellow. Drink more in hot weather and during exercise.  Do not smoke. Smoking lowers oxygen levels in the blood.  Only take over-the-counter or prescription medicines as told by your doctor.  Take antibiotic medicines as told by your doctor. Make sure you finish them even if you start to feel better.  Take supplements as told by your doctor.  Consider wearing a medical alert bracelet. This tells anyone caring for you in an emergency of your condition.  When traveling, keep your medical information, doctors' names, and the medicines you take with you at all times.  If you have a fever, do not take fever medicines right away. This could cover up a problem. Tell your doctor.  Keep all follow-up visits with your doctor. Sickle cell anemia requires regular medical care. Contact a doctor if: You have a fever. Get help right away if:  You feel dizzy or faint.  You have new belly (abdominal) pain, especially on the left side near the stomach area.  You have a lasting, often uncomfortable and painful erection of the penis (priapism). If it is not treated right away, you will become unable to have sex (impotence).  You have numbness in your arms or legs or you have a hard  time moving them.  You have a hard time talking.  You have a fever or lasting symptoms for more than 2-3 days.  You have a fever and your symptoms suddenly get worse.  You have signs or symptoms of infection. These include: ? Chills. ? Being more tired than normal (lethargy). ? Irritability. ? Poor eating. ? Throwing up (vomiting).  You have pain that is not helped with medicine.  You have shortness of breath.  You have pain in your chest.  You are coughing up pus-like or bloody mucus.  You have a stiff neck.  Your feet or hands swell or have pain.  Your belly looks bloated.  Your joints hurt. This information is not intended to replace advice given to you by your health care provider. Make sure you discuss any questions you have with your health care provider. Document Released: 03/25/2013 Document Revised: 11/10/2015 Document Reviewed: 01/14/2013 Elsevier Interactive Patient Education  2017 [employer].

## 2017-10-22 ENCOUNTER — Encounter (HOSPITAL_COMMUNITY): Payer: PPO

## 2017-11-12 ENCOUNTER — Ambulatory Visit: Payer: PPO | Admitting: Internal Medicine

## 2017-11-18 DIAGNOSIS — D631 Anemia in chronic kidney disease: Secondary | ICD-10-CM | POA: Diagnosis not present

## 2017-11-18 DIAGNOSIS — M109 Gout, unspecified: Secondary | ICD-10-CM | POA: Diagnosis not present

## 2017-11-18 DIAGNOSIS — N185 Chronic kidney disease, stage 5: Secondary | ICD-10-CM | POA: Diagnosis not present

## 2017-11-18 DIAGNOSIS — N2581 Secondary hyperparathyroidism of renal origin: Secondary | ICD-10-CM | POA: Diagnosis not present

## 2017-11-18 DIAGNOSIS — K2961 Other gastritis with bleeding: Secondary | ICD-10-CM | POA: Diagnosis not present

## 2017-11-18 DIAGNOSIS — I4891 Unspecified atrial fibrillation: Secondary | ICD-10-CM | POA: Diagnosis not present

## 2017-11-18 DIAGNOSIS — I5020 Unspecified systolic (congestive) heart failure: Secondary | ICD-10-CM | POA: Diagnosis not present

## 2017-11-18 DIAGNOSIS — Z720 Tobacco use: Secondary | ICD-10-CM | POA: Diagnosis not present

## 2017-11-18 DIAGNOSIS — D571 Sickle-cell disease without crisis: Secondary | ICD-10-CM | POA: Diagnosis not present

## 2017-11-18 DIAGNOSIS — I120 Hypertensive chronic kidney disease with stage 5 chronic kidney disease or end stage renal disease: Secondary | ICD-10-CM | POA: Diagnosis not present

## 2017-11-18 DIAGNOSIS — I770 Arteriovenous fistula, acquired: Secondary | ICD-10-CM | POA: Diagnosis not present

## 2017-11-23 ENCOUNTER — Other Ambulatory Visit: Payer: Self-pay | Admitting: Family Medicine

## 2017-11-27 ENCOUNTER — Ambulatory Visit (INDEPENDENT_AMBULATORY_CARE_PROVIDER_SITE_OTHER): Payer: PPO | Admitting: Family Medicine

## 2017-11-27 ENCOUNTER — Encounter: Payer: Self-pay | Admitting: Family Medicine

## 2017-11-27 VITALS — BP 134/67 | HR 84 | Temp 98.3°F | Resp 16 | Ht 71.0 in | Wt 149.0 lb

## 2017-11-27 DIAGNOSIS — I772 Rupture of artery: Secondary | ICD-10-CM

## 2017-11-27 DIAGNOSIS — D571 Sickle-cell disease without crisis: Secondary | ICD-10-CM

## 2017-11-27 DIAGNOSIS — N184 Chronic kidney disease, stage 4 (severe): Secondary | ICD-10-CM

## 2017-11-27 DIAGNOSIS — I10 Essential (primary) hypertension: Secondary | ICD-10-CM

## 2017-11-27 LAB — POCT URINALYSIS DIPSTICK
Bilirubin, UA: NEGATIVE
Blood, UA: NEGATIVE
Glucose, UA: NEGATIVE
Ketones, UA: NEGATIVE
Leukocytes, UA: NEGATIVE
Nitrite, UA: NEGATIVE
Protein, UA: POSITIVE — AB
Spec Grav, UA: 1.015 (ref 1.010–1.025)
Urobilinogen, UA: 0.2 E.U./dL
pH, UA: 6 (ref 5.0–8.0)

## 2017-11-27 NOTE — Progress Notes (Signed)
Subjective:    Patient ID: Patrick [name], male    DOB: [birth year], 62 y.o.   MRN: [id number]  HPI Patrick [name], a 62 year old male with a history of sickle cell anemia presents for a follow up of chronic conditions. Patient states that he is doing well and is without complaint.     He continues to follow up with Dr. Lorrene Reid, nephrology for stage 3 kidney disease. Patient has been mostly compliant with Hydrea 403 mg daily and folic acid 1 mg/day.  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  He is followed by Dr. Zadie Rhine ophthalmology, for his vision problems.  Patient is complaining of pain with deep breathing over the past 3 days.   Patient denies pain related to sickle cell anemia. He rarely takes pain medication for this problem.  Patient denies fatigue, shortness of breath, heart palpitations, dysuria, nausea, vomiting, or diarrhea.  Past Medical History:  Diagnosis Date  . Blood dyscrasia   . Blood transfusion    "I've had a bunch of them"  . CHF (congestive heart failure) ([place])    followed by hf clinic  . Chronic kidney disease (CKD), stage III (moderate) (HCC)   . Gout   . KVQQVZDG(387.5)    "probably weekly" (01/13/2014)  . Legally blind   . Shortness of breath dyspnea   . Sickle cell disease, type SS ([place])   . Swelling abdomen   . Swelling of extremity, left   . Swelling of extremity, right    Social History   Socioeconomic History  . Marital status: Divorced    Spouse name: Not on file  . Number of children: Not on file  . Years of education: Not on file  . Highest education level: Not on file  Occupational History  . Not on file  Social Needs  . Financial resource strain: Not on file  . Food insecurity:    Worry: Not on file    Inability: Not on file  . Transportation needs:    Medical: Not on file    Non-medical: Not on file  Tobacco Use  . Smoking status: Former Smoker    Packs/day:  0.25    Years: 41.00    Pack years: 10.25    Types: Cigarettes    Last attempt to quit: 12/08/2015    Years since quitting: 1.9  . Smokeless tobacco: Never Used  Substance and Sexual Activity  . Alcohol use: Yes    Alcohol/week: 1.8 oz    Types: 1 Glasses of wine, 2 Cans of beer per week    Comment: occasional  . Drug use: No  . Sexual activity: Not Currently  Lifestyle  . Physical activity:    Days per week: Not on file    Minutes per session: Not on file  . Stress: Not on file  Relationships  . Social connections:    Talks on phone: Not on file    Gets together: Not on file    Attends religious service: Not on file    Active member of club or organization: Not on file    Attends meetings of clubs or organizations: Not on file    Relationship status: Not on file  . Intimate partner violence:    Fear of current or ex partner: Not on file    Emotionally abused: Not on file    Physically abused: Not on file    Forced sexual  activity: Not on file  Other Topics Concern  . Not on file  Social History Narrative  . Not on file   Review of Systems  Constitutional: Negative.   HENT: Negative.   Eyes: Negative.        Legally blind  Respiratory: Negative.  Negative for apnea, shortness of breath and wheezing.   Gastrointestinal: Negative.   Endocrine: Negative for polydipsia, polyphagia and polyuria.  Genitourinary: Negative.   Musculoskeletal: Negative.   Skin: Negative.   Neurological: Negative.   Hematological: Negative.   Psychiatric/Behavioral: Negative.        Objective:   Physical Exam  Constitutional: He is oriented to person, place, and time. He appears well-developed and well-nourished.  HENT:  Head: Normocephalic and atraumatic.  Right Ear: External ear normal.  Left Ear: External ear normal.  Nose: Nose normal.  Mouth/Throat: Oropharynx is clear and moist.  Eyes: Pupils are equal, round, and reactive to light. Conjunctivae are normal.  Neck: Normal  range of motion. Neck supple.  Cardiovascular: Normal rate, regular rhythm, normal heart sounds and intact distal pulses.  Pulmonary/Chest: Effort normal and breath sounds normal.  Abdominal: Soft. Bowel sounds are normal.  Musculoskeletal:       Left ankle: He exhibits no swelling.  Neurological: He is alert and oriented to person, place, and time. He has normal reflexes.  Skin: Skin is warm and dry. Rash noted. Rash is papular.     Psychiatric: He has a normal mood and affect. His behavior is normal. Judgment and thought content normal.   BP 134/67 (BP Location: Left Arm, Patient Position: Sitting, Cuff Size: Normal)   Pulse 84   Temp 98.3 F (36.8 C) (Oral)   Resp 16   Ht 5\' 11"  (1.803 m)   Wt 149 lb (67.6 kg)   SpO2 100%   BMI 20.78 kg/m     Assessment & Plan:  Sickle cell anemia, HbSS [facility])   Continue Hydrea 500 mg daily. We discussed the need for good hydration, monitoring of hydration status, avoidance of heat, cold, stress, and infection triggers. We discussed the risks and benefits of Hydrea, including bone marrow suppression, the possibility of GI upset, skin ulcers, hair thinning, and teratogenicity. The patient was reminded of the need to seek medical attention of any ymptoms of bleeding, anemia, or infection. Continue folic acid 1 mg daily to prevent aplastic bone marrow crises.   Pulmonary evaluation - Patient denies severe recurrent wheezes, shortness of breath with exercise, or persistent cough. If these symptoms develop, pulmonary function tests with spirometry will be ordered, and if abnormal, plan on referral to Pulmonology for further evaluation.   Cardiac -patient is followed by cardiology, he is to follow-up as scheduled.  Eye -legally blind, patient is to schedule follow-up with ophthalmology   Immunization status -up-to-date with immunizations   - CBC with Differential - Basic Metabolic Panel - Urinalysis Dipstick  2. Fistula of artery [facility]) Mr.  Ra is concerned of bulging fistula. Will send a referral to vascular specialist for consult.  - Ambulatory referral to Vascular Surgery  3. Essential hypertension Blood pressure is at goal on current medication regimen. No medication changes warranted. Also, advised patient to follow up in cardiology as scheduled.   4. CKD (chronic kidney disease), stage IV ([place]) Patient advised to follow up in nephrology as scheduled.    RTC: 3 months for chronic conditions   [place]  MSN, FNP-C [facility] [address]  [place], [place] [address] [phone]

## 2017-11-27 NOTE — Patient Instructions (Signed)
A referral has been sent to vein and vascular for evaluation of right upper arm fistula  No medication changes warranted on today Continue to hydrate with 64 ounces of water per day.

## 2017-11-28 LAB — CBC WITH DIFFERENTIAL/PLATELET
Basophils Absolute: 0 10*3/uL (ref 0.0–0.2)
Basos: 1 %
EOS (ABSOLUTE): 0.3 10*3/uL (ref 0.0–0.4)
Eos: 5 %
Hematocrit: 23.4 % — ABNORMAL LOW (ref 37.5–51.0)
Hemoglobin: 7.8 g/dL — ABNORMAL LOW (ref 13.0–17.7)
Immature Grans (Abs): 0 10*3/uL (ref 0.0–0.1)
Immature Granulocytes: 1 %
Lymphocytes Absolute: 1.5 10*3/uL (ref 0.7–3.1)
Lymphs: 25 %
MCH: 34.2 pg — ABNORMAL HIGH (ref 26.6–33.0)
MCHC: 33.3 g/dL (ref 31.5–35.7)
MCV: 103 fL — ABNORMAL HIGH (ref 79–97)
Monocytes Absolute: 1 10*3/uL — ABNORMAL HIGH (ref 0.1–0.9)
Monocytes: 17 %
NRBC: 2 % — ABNORMAL HIGH (ref 0–0)
Neutrophils Absolute: 3.1 10*3/uL (ref 1.4–7.0)
Neutrophils: 51 %
Platelets: 109 10*3/uL — ABNORMAL LOW (ref 150–450)
RBC: 2.28 x10E6/uL — CL (ref 4.14–5.80)
RDW: 24.3 % — ABNORMAL HIGH (ref 12.3–15.4)
WBC: 6 10*3/uL (ref 3.4–10.8)

## 2017-11-28 LAB — BASIC METABOLIC PANEL
BUN/Creatinine Ratio: 15 (ref 9–20)
BUN: 84 mg/dL (ref 6–24)
CO2: 23 mmol/L (ref 20–29)
Calcium: 9.3 mg/dL (ref 8.7–10.2)
Chloride: 99 mmol/L (ref 96–106)
Creatinine, Ser: 5.54 mg/dL — ABNORMAL HIGH (ref 0.76–1.27)
GFR calc Af Amer: 12 mL/min/{1.73_m2} — ABNORMAL LOW (ref >59)
GFR calc non Af Amer: 10 mL/min/{1.73_m2} — ABNORMAL LOW (ref >59)
Glucose: 96 mg/dL (ref 65–99)
Potassium: 4.3 mmol/L (ref 3.5–5.2)
Sodium: 142 mmol/L (ref 134–144)

## 2017-12-05 ENCOUNTER — Inpatient Hospital Stay (HOSPITAL_COMMUNITY)
Admission: RE | Admit: 2017-12-05 | Discharge: 2017-12-05 | Disposition: A | Discharge status: Home / Self Care | Payer: PPO | Source: Ambulatory Visit | Attending: Nephrology | Admitting: Nephrology

## 2017-12-10 ENCOUNTER — Encounter (HOSPITAL_COMMUNITY)
Admission: RE | Admit: 2017-12-10 | Discharge: 2017-12-10 | Disposition: A | Discharge status: Home / Self Care | Payer: PPO | Source: Ambulatory Visit | Attending: Nephrology | Admitting: Nephrology

## 2017-12-10 VITALS — BP 119/73 | HR 74 | Temp 98.6°F | Resp 16

## 2017-12-10 DIAGNOSIS — D638 Anemia in other chronic diseases classified elsewhere: Secondary | ICD-10-CM | POA: Insufficient documentation

## 2017-12-10 DIAGNOSIS — N184 Chronic kidney disease, stage 4 (severe): Secondary | ICD-10-CM | POA: Diagnosis not present

## 2017-12-10 LAB — IRON AND TIBC
Iron: 57 ug/dL (ref 45–182)
Saturation Ratios: 17 % — ABNORMAL LOW (ref 17.9–39.5)
TIBC: 330 ug/dL (ref 250–450)
UIBC: 273 ug/dL

## 2017-12-10 LAB — FERRITIN: Ferritin: 20 ng/mL — ABNORMAL LOW (ref 24–336)

## 2017-12-10 LAB — POCT HEMOGLOBIN-HEMACUE: Hemoglobin: 7.7 g/dL — ABNORMAL LOW (ref 13.0–17.0)

## 2017-12-10 MED ORDER — DARBEPOETIN ALFA 200 MCG/0.4ML IJ SOSY
PREFILLED_SYRINGE | INTRAMUSCULAR | Status: AC
  Administered 2017-12-10: 200 ug via SUBCUTANEOUS
  Filled 2017-12-10: qty 0.4

## 2017-12-10 MED ORDER — DARBEPOETIN ALFA 200 MCG/0.4ML IJ SOSY
200.0000 ug | PREFILLED_SYRINGE | INTRAMUSCULAR | Status: DC
  Administered 2017-12-10: 200 ug via SUBCUTANEOUS

## 2017-12-10 NOTE — Progress Notes (Addendum)
hgb via hemocue 7.7. Pt denies SOB,chest, bleeding, no acite distress. Stacy at Dr Lorrene Reid office notified. Give aranesp 260mcg as scheduled today.  Pt instructed to report to ED if CP, SOB develops and he verbalized understanding

## 2017-12-20 ENCOUNTER — Other Ambulatory Visit (HOSPITAL_COMMUNITY): Payer: Self-pay

## 2017-12-23 ENCOUNTER — Ambulatory Visit (HOSPITAL_COMMUNITY): Payer: PPO | Attending: Nephrology

## 2017-12-25 ENCOUNTER — Other Ambulatory Visit: Payer: Self-pay | Admitting: Internal Medicine

## 2017-12-25 DIAGNOSIS — I5033 Acute on chronic diastolic (congestive) heart failure: Secondary | ICD-10-CM

## 2017-12-26 ENCOUNTER — Encounter: Payer: Self-pay | Admitting: Family

## 2017-12-26 ENCOUNTER — Ambulatory Visit (INDEPENDENT_AMBULATORY_CARE_PROVIDER_SITE_OTHER): Payer: PPO | Admitting: Family

## 2017-12-26 ENCOUNTER — Other Ambulatory Visit: Payer: Self-pay

## 2017-12-26 VITALS — BP 126/71 | HR 82 | Temp 98.0°F | Resp 16 | Ht 71.0 in | Wt 149.0 lb

## 2017-12-26 DIAGNOSIS — T82898D Other specified complication of vascular prosthetic devices, implants and grafts, subsequent encounter: Secondary | ICD-10-CM | POA: Diagnosis not present

## 2017-12-26 NOTE — Progress Notes (Signed)
CC: Occasional pain of right upper arm AV fistula, stable aneurysmal dilations, referred by his PCP for evaluation   History of Present Illness  Patrick [name] is a 62 y.o. (02/09/1956) male who is status post right basilic vein transposition fistula creation on 04-12-14 by Dr. Oneida Alar.   Pt was evaluated in our practice on 05-27-14 by Dr. Oneida Alar. At that time swelling of the right upper extremity had improved significantly since his prior office visit. He denied any numbness or tingling in his hand. He was not on dialysis at that time.  Incisions of right upper extremity were healing, audible bruit and palpable thrill in fistula. Edema extending from the wrist and slightly in the dorsum of the hand 2+ right radial pulse no ulcerations on the fingertips. Chest: No obvious chest wall collaterals Data: Recent duplex ultrasound of the patient fistula showed 8-10 mm diameter less than 6 mm from the skin no narrowing. Assessment: Right upper extremity swelling significantly improved. This may just be due to disruption of several venous branch collaterals from harvesting the basilic vein. Plan: Maturing fistula improving edema follow-up as needed.   Pt returned on 01-10-17 with c/o aneurysmal degeneration of right upper arm AV fistula, referred by Dr. Lorrene Reid.  Pt reports occasional throbbing/pain at the distal end of the AV fistula.  He denies steal symptoms in right hand, denies chest pain or dyspnea. He is not on HD yet, and states Dr. Jose Persia told him there is no immediate need for HD.  At that visit pt was not yet in need of HD, his May 2018 eGFR was 16.  Dr. Oneida Alar spoke with and examined pt at that visit; offered to ligate this this if desired. Should avoid fistula- gram to spare his renal function the contrast exposure.  Pt states he will think about and discuss with Dr. Lorrene Reid whether the AV fistula should be ligated, will let us know if this is what is desired.   Pt returns today  referred by his PCP for aneurysmal degeneration of right upper arm AV fistula.  He denies any steal type symptoms in his upper extremities.  He is not yet on hemodialysis, and states Dr. Lorrene Reid indicates that he will not need this anytime soon as is renal function remains stable.  He reports occasional tight feeling at the well healed incision of the AV fistula.    Past Medical History:  Diagnosis Date  . Blood dyscrasia   . Blood transfusion    "I've had a bunch of them"  . CHF (congestive heart failure) ([place])    followed by hf clinic  . Chronic kidney disease (CKD), stage III (moderate) (HCC)   . Gout   . QQVZDGLO(756.4)    "probably weekly" (01/13/2014)  . Legally blind   . Shortness of breath dyspnea   . Sickle cell disease, type SS ([place])   . Swelling abdomen   . Swelling of extremity, left   . Swelling of extremity, right     Social History Social History   Tobacco Use  . Smoking status: Former Smoker    Packs/day: 0.25    Years: 41.00    Pack years: 10.25    Types: Cigarettes    Last attempt to quit: 12/08/2015    Years since quitting: 2.0  . Smokeless tobacco: Never Used  Substance Use Topics  . Alcohol use: Yes    Alcohol/week: 1.8 oz    Types: 1 Glasses of wine, 2 Cans of beer per week  Comment: occasional  . Drug use: No    Family History Family History  Problem Relation Age of Onset  . Alcohol abuse Mother   . Liver disease Mother   . Cirrhosis Mother   . Heart attack Mother     Surgical History Past Surgical History:  Procedure Laterality Date  . BASCILIC VEIN TRANSPOSITION Right 04/12/2014   Procedure: BASILIC VEIN TRANSPOSITION;  Surgeon: Elam Dutch, MD;  Location: [facility];  Service: Vascular;  Laterality: Right;  . CARDIAC CATHETERIZATION  05/2011  . COLONOSCOPY WITH PROPOFOL N/A 09/24/2017   Procedure: COLONOSCOPY WITH PROPOFOL;  Surgeon: Jackquline Denmark, MD;  Location: [facility] ENDOSCOPY;  Service: Endoscopy;  Laterality: N/A;  .  ESOPHAGOGASTRODUODENOSCOPY N/A 09/19/2017   Procedure: ESOPHAGOGASTRODUODENOSCOPY (EGD);  Surgeon: Jackquline Denmark, MD;  Location: [facility] ENDOSCOPY;  Service: Endoscopy;  Laterality: N/A;  . LEFT AND RIGHT HEART CATHETERIZATION WITH CORONARY ANGIOGRAM N/A 06/11/2011   Procedure: LEFT AND RIGHT HEART CATHETERIZATION WITH CORONARY ANGIOGRAM;  Surgeon: Larey Dresser, MD;  Location: [facility] CATH LAB;  Service: Cardiovascular;  Laterality: N/A;  . TEE WITHOUT CARDIOVERSION N/A 12/27/2015   Procedure: TRANSESOPHAGEAL ECHOCARDIOGRAM (TEE);  Surgeon: Sueanne Margarita, MD;  Location: [facility] ENDOSCOPY;  Service: Cardiovascular;  Laterality: N/A;    No Known Allergies  Current Outpatient Medications  Medication Sig Dispense Refill  . allopurinol (ZYLOPRIM) 100 MG tablet Take 1 tablet (100 mg total) by mouth daily. (Patient taking differently: Take 100 mg by mouth 2 (two) times daily. ) 90 tablet 0  . apixaban (ELIQUIS) 5 MG TABS tablet Take 1 tablet (5 mg total) by mouth 2 (two) times daily. 60 tablet 6  . calcitRIOL (ROCALTROL) 0.25 MCG capsule Take 0.25 mcg by mouth daily.     . carvedilol (COREG) 12.5 MG tablet Take 1 tablet (12.5 mg total) by mouth 2 (two) times daily with a meal. 60 tablet 6  . folic acid (FOLVITE) 1 MG tablet TAKE 1 TABLET (1 MG TOTAL) BY MOUTH DAILY. 30 tablet 11  . furosemide (LASIX) 20 MG tablet Take 40 mg by mouth daily as needed for fluid or edema.    . hydrALAZINE (APRESOLINE) 25 MG tablet Take 1 tablet (25 mg total) by mouth 2 (two) times daily. 60 tablet 6  . hydroxyurea (HYDREA) 500 MG capsule TAKE ONE CAPSULE EVERY DAY WITH FOOD TO MINIMIZE GI SIDE EFFECTS 30 capsule 11  . isosorbide mononitrate (IMDUR) 30 MG 24 hr tablet Take 1 tablet (30 mg total) by mouth daily. 30 tablet 6  . oxycodone (OXY-IR) 5 MG capsule Take 1 capsule (5 mg total) by mouth every 6 (six) hours as needed. 30 capsule 0  . pantoprazole (PROTONIX) 40 MG tablet TAKE 1 TABLET (40 MG TOTAL) BY MOUTH DAILY AT 6 (SIX) AM. 30  tablet 0   No current facility-administered medications for this visit.      REVIEW OF SYSTEMS: see HPI for pertinent positives and negatives    PHYSICAL EXAMINATION:  Vitals:   12/26/17 1242  BP: 126/71  Pulse: 82  Resp: 16  Temp: 98 F (36.7 C)  TempSrc: Oral  Weight: 149 lb (67.6 kg)  Height: 5' 11"  (1.803 m)   Body mass index is 20.78 kg/m.  General: The patient appears his stated age.   HEENT:  No gross abnormalities Pulmonary: Respirations are non-labored, CTAB Abdomen: Soft and non-tender with normal pitched bowel sounds. Musculoskeletal: There are no major deformities.   Neurologic: No focal weakness or paresthesias are detected Skin: There are  no ulcer or rashes noted. Psychiatric: The patient has normal affect. Cardiovascular: There is a rregular rhythm and rate, without significant murmur appreciated. Generalized enlargement of right upper arm AV fistula with 2-3 aneurysmal dilations, palpable thrill, audible bruit. His bilateral pedal pulses are palpable.  Bilateral radial pulses are 2+ palpable.    Right arm AV fistula     Medical Decision Making  Patrick [name] is a 62 y.o. male who is status post right basilic vein transposition fistula creation on 04-12-14 by Dr. Oneida Alar.   His complaint today is the same as was a year visit; referred a year ago by Dr. Lorrene Reid, today by his PCP for evaluation of aneurysmal right upper arm AV fistula and occasional tight feeling at the scar of incision of the AVF.  No steal sx's, good thrill and bruit, 2+ palpable bilateral radial pulses.  Pt is not yet on hemodialysis.   Dr. Oneida Alar spoke with and examined pt. Well functioning right upper arm AV fistula with some aneurysmal dilations, no thinning of skin over the AVF.   Dr. Oneida Alar offered to pt that the AVF could be ligated it he and Dr. Lorrene Reid would like this to occur, but it is functioning well with no steal sx's, no evidence of thinning skin over AVF.  Pt  states he will discuss with Dr. Lorrene Reid, and let us know if he wants this ligated, and start the process for searching for another AV fistula site.     Clemon Chambers, RN, MSN, FNP-C [facility] Office: [phone]  12/26/2017, 12:57 PM  Clinic MD: Oneida Alar

## 2018-01-02 ENCOUNTER — Encounter (HOSPITAL_COMMUNITY): Payer: PPO

## 2018-01-07 ENCOUNTER — Encounter (HOSPITAL_COMMUNITY): Payer: PPO

## 2018-01-07 ENCOUNTER — Encounter (HOSPITAL_COMMUNITY)
Admission: RE | Admit: 2018-01-07 | Discharge: 2018-01-07 | Disposition: A | Discharge status: Home / Self Care | Payer: PPO | Source: Ambulatory Visit | Attending: Nephrology | Admitting: Nephrology

## 2018-01-07 VITALS — BP 140/82 | HR 84 | Temp 98.2°F | Ht 71.0 in | Wt 150.0 lb

## 2018-01-07 DIAGNOSIS — D638 Anemia in other chronic diseases classified elsewhere: Secondary | ICD-10-CM | POA: Insufficient documentation

## 2018-01-07 DIAGNOSIS — N184 Chronic kidney disease, stage 4 (severe): Secondary | ICD-10-CM | POA: Diagnosis not present

## 2018-01-07 LAB — FERRITIN: Ferritin: 27 ng/mL (ref 24–336)

## 2018-01-07 LAB — IRON AND TIBC
Iron: 61 ug/dL (ref 45–182)
Saturation Ratios: 18 % (ref 17.9–39.5)
TIBC: 346 ug/dL (ref 250–450)
UIBC: 285 ug/dL

## 2018-01-07 LAB — POCT HEMOGLOBIN-HEMACUE: Hemoglobin: 7.3 g/dL — ABNORMAL LOW (ref 13.0–17.0)

## 2018-01-07 MED ORDER — DARBEPOETIN ALFA 200 MCG/0.4ML IJ SOSY: 200.0000 ug | PREFILLED_SYRINGE | INTRAMUSCULAR | Status: DC

## 2018-01-07 MED ORDER — DARBEPOETIN ALFA 200 MCG/0.4ML IJ SOSY
PREFILLED_SYRINGE | INTRAMUSCULAR | Status: AC
  Administered 2018-01-07: 200 ug
  Filled 2018-01-07: qty 0.4

## 2018-01-07 NOTE — Progress Notes (Signed)
Pt here for Aranesp appt.  HGB 7.3 via hemocue.  Pt asymptomatic, no bleeding noted.  Pt had been scheduled for Feraheme 12/23/17 but did not show up.  Office notified.  Instructed to go ahead and give Aranesp injection and to have patient schedule Feraheme infusion.  Pt will not schedule appt at this time due to work but states that he will call to schedule for next week

## 2018-01-12 ENCOUNTER — Other Ambulatory Visit: Payer: Self-pay | Admitting: Internal Medicine

## 2018-01-12 DIAGNOSIS — I5033 Acute on chronic diastolic (congestive) heart failure: Secondary | ICD-10-CM

## 2018-01-13 ENCOUNTER — Telehealth: Payer: Self-pay | Admitting: Pharmacist

## 2018-01-13 NOTE — Telephone Encounter (Signed)
Fax requested from [facility] on [address]. Pt is requesting pantoprazole 40 mg daily. Pt is establishing with Lanae Boast at [facility]. Will forward information to that provider.

## 2018-01-15 ENCOUNTER — Other Ambulatory Visit (HOSPITAL_COMMUNITY): Payer: Self-pay | Admitting: *Deleted

## 2018-01-15 ENCOUNTER — Ambulatory Visit (HOSPITAL_COMMUNITY)
Admission: RE | Admit: 2018-01-15 | Discharge: 2018-01-15 | Disposition: A | Discharge status: Home / Self Care | Payer: PPO | Source: Ambulatory Visit | Attending: Nephrology | Admitting: Nephrology

## 2018-01-15 DIAGNOSIS — N189 Chronic kidney disease, unspecified: Secondary | ICD-10-CM | POA: Insufficient documentation

## 2018-01-15 DIAGNOSIS — D631 Anemia in chronic kidney disease: Secondary | ICD-10-CM | POA: Insufficient documentation

## 2018-01-15 MED ORDER — TORSEMIDE 100 MG PO TABS: 100.0000 mg | ORAL_TABLET | Freq: Every day | ORAL | 3 refills | Status: DC

## 2018-01-15 MED ORDER — SODIUM CHLORIDE 0.9 % IV SOLN
510.0000 mg | Freq: Once | INTRAVENOUS | Status: AC
  Administered 2018-01-15: 510 mg via INTRAVENOUS
  Filled 2018-01-15: qty 17

## 2018-01-20 ENCOUNTER — Telehealth: Payer: Self-pay

## 2018-01-20 DIAGNOSIS — I5020 Unspecified systolic (congestive) heart failure: Secondary | ICD-10-CM | POA: Diagnosis not present

## 2018-01-20 DIAGNOSIS — I770 Arteriovenous fistula, acquired: Secondary | ICD-10-CM | POA: Diagnosis not present

## 2018-01-20 DIAGNOSIS — D571 Sickle-cell disease without crisis: Secondary | ICD-10-CM

## 2018-01-20 DIAGNOSIS — N185 Chronic kidney disease, stage 5: Secondary | ICD-10-CM | POA: Diagnosis not present

## 2018-01-20 DIAGNOSIS — Z720 Tobacco use: Secondary | ICD-10-CM | POA: Diagnosis not present

## 2018-01-20 DIAGNOSIS — I120 Hypertensive chronic kidney disease with stage 5 chronic kidney disease or end stage renal disease: Secondary | ICD-10-CM | POA: Diagnosis not present

## 2018-01-20 DIAGNOSIS — N2581 Secondary hyperparathyroidism of renal origin: Secondary | ICD-10-CM | POA: Diagnosis not present

## 2018-01-20 DIAGNOSIS — D631 Anemia in chronic kidney disease: Secondary | ICD-10-CM | POA: Diagnosis not present

## 2018-01-20 DIAGNOSIS — M109 Gout, unspecified: Secondary | ICD-10-CM | POA: Diagnosis not present

## 2018-01-20 DIAGNOSIS — I4891 Unspecified atrial fibrillation: Secondary | ICD-10-CM | POA: Diagnosis not present

## 2018-01-20 MED ORDER — HYDROXYUREA 500 MG PO CAPS: ORAL_CAPSULE | ORAL | 11 refills | Status: DC

## 2018-01-20 MED ORDER — TORSEMIDE 100 MG PO TABS: 100.0000 mg | ORAL_TABLET | Freq: Every day | ORAL | 3 refills | Status: DC

## 2018-01-20 NOTE — Telephone Encounter (Signed)
Refills sent into pharmacy. Thanks!  

## 2018-01-26 ENCOUNTER — Other Ambulatory Visit: Payer: Self-pay | Admitting: Internal Medicine

## 2018-01-27 ENCOUNTER — Ambulatory Visit (INDEPENDENT_AMBULATORY_CARE_PROVIDER_SITE_OTHER): Payer: PPO | Admitting: Family Medicine

## 2018-01-27 ENCOUNTER — Encounter: Payer: Self-pay | Admitting: Family Medicine

## 2018-01-27 VITALS — BP 125/70 | HR 84 | Temp 98.5°F | Resp 16 | Ht 71.0 in | Wt 152.0 lb

## 2018-01-27 DIAGNOSIS — I10 Essential (primary) hypertension: Secondary | ICD-10-CM

## 2018-01-27 DIAGNOSIS — D571 Sickle-cell disease without crisis: Secondary | ICD-10-CM | POA: Diagnosis not present

## 2018-01-27 LAB — POCT URINALYSIS DIPSTICK
Bilirubin, UA: NEGATIVE
Blood, UA: NEGATIVE
Glucose, UA: NEGATIVE
Ketones, UA: NEGATIVE
Leukocytes, UA: NEGATIVE
Nitrite, UA: NEGATIVE
Protein, UA: POSITIVE — AB
Spec Grav, UA: 1.015 (ref 1.010–1.025)
Urobilinogen, UA: 0.2 E.U./dL
pH, UA: 6 (ref 5.0–8.0)

## 2018-01-27 MED ORDER — OXYCODONE HCL 5 MG PO CAPS: 5.0000 mg | ORAL_CAPSULE | Freq: Four times a day (QID) | ORAL | 0 refills | Status: DC | PRN

## 2018-01-27 MED ORDER — OXYCODONE HCL 5 MG PO TABS: 5.0000 mg | ORAL_TABLET | Freq: Four times a day (QID) | ORAL | 0 refills | Status: DC | PRN

## 2018-01-27 NOTE — Progress Notes (Signed)
[facility] INTERNAL MEDICINE AND SICKLE CELL CARE  SICKLE CELL ANEMIA FOLLOW UP VISIT PROVIDER: Lanae Boast, FNP    [id number] Patrick [name]   Subjective :   Past Medical History:  Diagnosis Date  . Blood dyscrasia   . Blood transfusion    "I've had a bunch of them"  . CHF (congestive heart failure) ([place])    followed by hf clinic  . Chronic kidney disease (CKD), stage III (moderate) (HCC)   . Gout   . GDJMEQAS(341.9)    "probably weekly" (01/13/2014)  . Legally blind   . Shortness of breath dyspnea   . Sickle cell disease, type SS ([place])   . Swelling abdomen   . Swelling of extremity, left   . Swelling of extremity, right     Social History   Socioeconomic History  . Marital status: Divorced    Spouse name: Not on file  . Number of children: Not on file  . Years of education: Not on file  . Highest education level: Not on file  Occupational History  . Not on file  Social Needs  . Financial resource strain: Not on file  . Food insecurity:    Worry: Not on file    Inability: Not on file  . Transportation needs:    Medical: Not on file    Non-medical: Not on file  Tobacco Use  . Smoking status: Former Smoker    Packs/day: 0.25    Years: 41.00    Pack years: 10.25    Types: Cigarettes    Last attempt to quit: 12/08/2015    Years since quitting: 2.1  . Smokeless tobacco: Never Used  Substance and Sexual Activity  . Alcohol use: Yes    Alcohol/week: 3.0 standard drinks    Types: 1 Glasses of wine, 2 Cans of beer per week    Comment: occasional  . Drug use: No  . Sexual activity: Not Currently  Lifestyle  . Physical activity:    Days per week: Not on file    Minutes per session: Not on file  . Stress: Not on file  Relationships  . Social connections:    Talks on phone: Not on file    Gets together: Not on file    Attends religious service: Not on file    Active member of club or organization: Not on file    Attends meetings of clubs  or organizations: Not on file    Relationship status: Not on file  . Intimate partner violence:    Fear of current or ex partner: Not on file    Emotionally abused: Not on file    Physically abused: Not on file    Forced sexual activity: Not on file  Other Topics Concern  . Not on file  Social History Narrative  . Not on file    HPI   Patrick [name]  is a 62 y.o.  male who presents for a follow up for Sickle Cell Anemia- SS.  His  last hospitalization was in April 2019 and was not related to SCD.   Pain regimen includes: Oxycodone 5mg  IR PRN Hydrea Therapy: Yes Medication compliance: Yes  Pain today is 0/10 and is located: usually in the lower extremities.  Patient drinks 80 ounces or more of fluid per day.    Review of Systems  Constitutional: Negative.   HENT: Negative.   Eyes:       Loss of vision- legally blind. Followed by opth.  Respiratory: Negative.   Cardiovascular: Negative.   Gastrointestinal: Negative.   Genitourinary: Negative.   Musculoskeletal: Negative.   Skin: Negative.   Neurological: Negative.   Psychiatric/Behavioral: Negative.     Objective  BP 125/70 (BP Location: Left Arm, Patient Position: Sitting, Cuff Size: Normal)   Pulse 84   Temp 98.5 F (36.9 C) (Oral)   Resp 16   Ht 5\' 11"  (1.803 m)   Wt 152 lb (68.9 kg)   SpO2 98%   BMI 21.20 kg/m    Physical Exam  Constitutional: He is oriented to person, place, and time. He appears well-developed and well-nourished.  HENT:  Head: Normocephalic and atraumatic.  Eyes: Pupils are equal, round, and reactive to light. Conjunctivae and EOM are normal.  Neck: Normal range of motion. Neck supple. No tracheal deviation present. No thyromegaly present.  Cardiovascular: Normal rate, regular rhythm, normal heart sounds and intact distal pulses. Exam reveals no friction rub.  No murmur heard. Pulmonary/Chest: Effort normal and breath sounds normal. No stridor. No respiratory distress. He has no  wheezes.  Abdominal: Soft. Bowel sounds are normal. He exhibits no distension and no mass. There is no tenderness.  Musculoskeletal: Normal range of motion.  Lymphadenopathy:    He has no cervical adenopathy.  Neurological: He is alert and oriented to person, place, and time.  Skin: Skin is warm and dry.  Psychiatric: He has a normal mood and affect. His behavior is normal. Judgment and thought content normal.  Nursing note and vitals reviewed.     Assessment   Encounter Diagnoses  Name Primary?  Marland Kitchen Hb-SS disease without crisis ([place]) Yes  . Essential hypertension      Plan  1. Hb-SS disease without crisis [facility]) Patient doing well. The current medical regimen is effective;  continue present plan and medications. - oxycodone (OXY-IR) 5 MG capsule; Take 1 capsule (5 mg total) by mouth every 6 (six) hours as needed for pain.  Dispense: 45 capsule; Refill: 0 - CBC with Differential - Comprehensive metabolic panel  2. Essential hypertension The current medical regimen is effective;  continue present plan and medications. - Urinalysis Dipstick - CBC with Differential - Comprehensive metabolic panel   Return to care as scheduled and prn. Patient verbalized understanding and agreed with plan of care.   1. Sickle cell disease - Continue Hydrea .We discussed the need for good hydration, monitoring of hydration status, avoidance of heat, cold, stress, and infection triggers. We discussed the risks and benefits of Hydrea, including bone marrow suppression, the possibility of GI upset, skin ulcers, hair thinning, and teratogenicity. The patient was reminded of the need to seek medical attention of any symptoms of bleeding, anemia, or infection. Continue folic acid 1 mg daily to prevent aplastic bone marrow crises.   2. Pulmonary evaluation - Patient denies severe recurrent wheezes, shortness of breath with exercise, or persistent cough. If these symptoms develop, pulmonary function tests with  spirometry will be ordered, and if abnormal, plan on referral to Pulmonology for further evaluation.  3. Cardiac - Routine screening for pulmonary hypertension is not recommended.  4. Eye - Patient legally blind. Follows with opthalmology.   5. Immunization status -  Yearly influenza vaccination is recommended, as well as being up to date with Meningococcal and Pneumococcal vaccines.   6. Acute and chronic painful episodes - We agreed on Oxycodone  . We discussed that pt is to receive Schedule II prescriptions only from [place]. Pt is also aware that the prescription history  is available to [place] online through the [place]. Controlled substance agreement signed (Date). We reminded (Pt) that all patients receiving Schedule II narcotics must be seen for follow within one month of prescription being requested. We reviewed the terms of our pain agreement, including the need to keep medicines in a safe locked location away from children or pets, and the need to report excess sedation or constipation, measures to avoid constipation, and policies related to early refills and stolen prescriptions. According to the [place] [facility] program, we have reviewed details related to analgesia, adverse effects, aberrant behaviors.  7. Iron overload from chronic transfusion.  Not applicable. If this occurs will use Exjade for management.   8. Vitamin D deficiency - Drisdol 50,000 units weekly. Patient encouraged to take as prescribed.   The above recommendations are taken from the NIH Evidence-Based Management of Sickle Cell Disease: Expert Panel Report, 20149.   Ms. Andr L. Nathaneil Canary, FNP-BC [facility] [address] [place], [place] [address] [phone]  This note has been created with Dragon speech recognition software and smart phrase technology. Any transcriptional errors are unintentional.

## 2018-01-27 NOTE — Patient Instructions (Signed)
Sickle Cell Anemia, Adult Sickle cell anemia is a condition where your red blood cells are shaped like sickles. Red blood cells carry oxygen through the body. Sickle-shaped red blood cells do not live as long as normal red blood cells. They also clump together and block blood from flowing through the blood vessels. These things prevent the body from getting enough oxygen. Sickle cell anemia causes organ damage and pain. It also increases the risk of infection. Follow these instructions at home:  Drink enough fluid to keep your pee (urine) clear or pale yellow. Drink more in hot weather and during exercise.  Do not smoke. Smoking lowers oxygen levels in the blood.  Only take over-the-counter or prescription medicines as told by your doctor.  Take antibiotic medicines as told by your doctor. Make sure you finish them even if you start to feel better.  Take supplements as told by your doctor.  Consider wearing a medical alert bracelet. This tells anyone caring for you in an emergency of your condition.  When traveling, keep your medical information, doctors' names, and the medicines you take with you at all times.  If you have a fever, do not take fever medicines right away. This could cover up a problem. Tell your doctor.  Keep all follow-up visits with your doctor. Sickle cell anemia requires regular medical care. Contact a doctor if: You have a fever. Get help right away if:  You feel dizzy or faint.  You have new belly (abdominal) pain, especially on the left side near the stomach area.  You have a lasting, often uncomfortable and painful erection of the penis (priapism). If it is not treated right away, you will become unable to have sex (impotence).  You have numbness in your arms or legs or you have a hard time moving them.  You have a hard time talking.  You have a fever or lasting symptoms for more than 2-3 days.  You have a fever and your symptoms suddenly get  worse.  You have signs or symptoms of infection. These include: ? Chills. ? Being more tired than normal (lethargy). ? Irritability. ? Poor eating. ? Throwing up (vomiting).  You have pain that is not helped with medicine.  You have shortness of breath.  You have pain in your chest.  You are coughing up pus-like or bloody mucus.  You have a stiff neck.  Your feet or hands swell or have pain.  Your belly looks bloated.  Your joints hurt. This information is not intended to replace advice given to you by your health care provider. Make sure you discuss any questions you have with your health care provider. Document Released: 03/25/2013 Document Revised: 11/10/2015 Document Reviewed: 01/14/2013 Elsevier Interactive Patient Education  2017 [place]. Hypertension Hypertension is another name for high blood pressure. High blood pressure forces your heart to work harder to pump blood. This can cause problems over time. There are two numbers in a blood pressure reading. There is a top number (systolic) over a bottom number (diastolic). It is best to have a blood pressure below 120/80. Healthy choices can help lower your blood pressure. You may need medicine to help lower your blood pressure if:  Your blood pressure cannot be lowered with healthy choices.  Your blood pressure is higher than 130/80.  Follow these instructions at home: Eating and drinking  If directed, follow the DASH eating plan. This diet includes: ? Filling half of your plate at each meal with fruits and  vegetables. ? Filling one quarter of your plate at each meal with whole grains. Whole grains include whole wheat pasta, brown rice, and whole grain bread. ? Eating or drinking low-fat dairy products, such as skim milk or low-fat yogurt. ? Filling one quarter of your plate at each meal with low-fat (lean) proteins. Low-fat proteins include fish, skinless chicken, eggs, beans, and tofu. ? Avoiding fatty meat,  cured and processed meat, or chicken with skin. ? Avoiding premade or processed food.  Eat less than 1,500 mg of salt (sodium) a day.  Limit alcohol use to no more than 1 drink a day for nonpregnant women and 2 drinks a day for men. One drink equals 12 oz of beer, 5 oz of wine, or 1 oz of hard liquor. Lifestyle  Work with your doctor to stay at a healthy weight or to lose weight. Ask your doctor what the best weight is for you.  Get at least 30 minutes of exercise that causes your heart to beat faster (aerobic exercise) most days of the week. This may include walking, swimming, or biking.  Get at least 30 minutes of exercise that strengthens your muscles (resistance exercise) at least 3 days a week. This may include lifting weights or pilates.  Do not use any products that contain nicotine or tobacco. This includes cigarettes and e-cigarettes. If you need help quitting, ask your doctor.  Check your blood pressure at home as told by your doctor.  Keep all follow-up visits as told by your doctor. This is important. Medicines  Take over-the-counter and prescription medicines only as told by your doctor. Follow directions carefully.  Do not skip doses of blood pressure medicine. The medicine does not work as well if you skip doses. Skipping doses also puts you at risk for problems.  Ask your doctor about side effects or reactions to medicines that you should watch for. Contact a doctor if:  You think you are having a reaction to the medicine you are taking.  You have headaches that keep coming back (recurring).  You feel dizzy.  You have swelling in your ankles.  You have trouble with your vision. Get help right away if:  You get a very bad headache.  You start to feel confused.  You feel weak or numb.  You feel faint.  You get very bad pain in your: ? Chest. ? Belly (abdomen).  You throw up (vomit) more than once.  You have trouble  breathing. Summary  Hypertension is another name for high blood pressure.  Making healthy choices can help lower blood pressure. If your blood pressure cannot be controlled with healthy choices, you may need to take medicine. This information is not intended to replace advice given to you by your health care provider. Make sure you discuss any questions you have with your health care provider. Document Released: 11/21/2007 Document Revised: 05/02/2016 Document Reviewed: 05/02/2016 Elsevier Interactive Patient Education  [employer].

## 2018-01-28 LAB — CBC WITH DIFFERENTIAL/PLATELET
Basophils Absolute: 0.1 10*3/uL (ref 0.0–0.2)
Basos: 1 %
EOS (ABSOLUTE): 0.3 10*3/uL (ref 0.0–0.4)
Eos: 4 %
Hematocrit: 21 % — ABNORMAL LOW (ref 37.5–51.0)
Hemoglobin: 7.3 g/dL — ABNORMAL LOW (ref 13.0–17.7)
Immature Grans (Abs): 0 10*3/uL (ref 0.0–0.1)
Immature Granulocytes: 0 %
Lymphocytes Absolute: 1.4 10*3/uL (ref 0.7–3.1)
Lymphs: 20 %
MCH: 40.1 pg — ABNORMAL HIGH (ref 26.6–33.0)
MCHC: 34.8 g/dL (ref 31.5–35.7)
MCV: 115 fL — ABNORMAL HIGH (ref 79–97)
Monocytes Absolute: 1.3 10*3/uL — ABNORMAL HIGH (ref 0.1–0.9)
Monocytes: 20 %
NRBC: 5 % — ABNORMAL HIGH (ref 0–0)
Neutrophils Absolute: 3.8 10*3/uL (ref 1.4–7.0)
Neutrophils: 55 %
Platelets: 180 10*3/uL (ref 150–450)
RBC: 1.82 x10E6/uL — CL (ref 4.14–5.80)
RDW: 21.7 % — ABNORMAL HIGH (ref 12.3–15.4)
WBC: 6.8 10*3/uL (ref 3.4–10.8)

## 2018-01-28 LAB — COMPREHENSIVE METABOLIC PANEL
ALT: 14 IU/L (ref 0–44)
AST: 28 IU/L (ref 0–40)
Albumin/Globulin Ratio: 1.5 (ref 1.2–2.2)
Albumin: 4 g/dL (ref 3.5–5.5)
Alkaline Phosphatase: 64 IU/L (ref 39–117)
BUN/Creatinine Ratio: 18 (ref 9–20)
BUN: 99 mg/dL (ref 6–24)
Bilirubin Total: 0.9 mg/dL (ref 0.0–1.2)
CO2: 19 mmol/L — ABNORMAL LOW (ref 20–29)
Calcium: 8.8 mg/dL (ref 8.7–10.2)
Chloride: 100 mmol/L (ref 96–106)
Creatinine, Ser: 5.61 mg/dL — ABNORMAL HIGH (ref 0.76–1.27)
GFR calc Af Amer: 12 mL/min/{1.73_m2} — ABNORMAL LOW (ref >59)
GFR calc non Af Amer: 10 mL/min/{1.73_m2} — ABNORMAL LOW (ref >59)
Globulin, Total: 2.7 g/dL (ref 1.5–4.5)
Glucose: 86 mg/dL (ref 65–99)
Potassium: 4.4 mmol/L (ref 3.5–5.2)
Sodium: 139 mmol/L (ref 134–144)
Total Protein: 6.7 g/dL (ref 6.0–8.5)

## 2018-01-30 ENCOUNTER — Other Ambulatory Visit: Payer: Self-pay

## 2018-01-30 NOTE — Patient Outreach (Signed)
[facility]) Care Management  01/30/2018  Patrick [name] 04-Nov-1955 [id number]   Referral received from [place].  Information is not actually a referral but information from kidney doctor as [facility] was part of the care team previously.    Plan: RN CM will close case at this time.    [name], RN, MSN [place] Management Care Management Coordinator Direct Line [phone] Cell [phone] Toll Free: [phone]  Fax: [phone]

## 2018-02-04 ENCOUNTER — Encounter (HOSPITAL_COMMUNITY)
Admission: RE | Admit: 2018-02-04 | Discharge: 2018-02-04 | Disposition: A | Discharge status: Home / Self Care | Payer: PPO | Source: Ambulatory Visit | Attending: Nephrology | Admitting: Nephrology

## 2018-02-04 VITALS — BP 136/74 | HR 84 | Temp 97.9°F | Ht 71.0 in | Wt 152.0 lb

## 2018-02-04 DIAGNOSIS — D638 Anemia in other chronic diseases classified elsewhere: Secondary | ICD-10-CM | POA: Insufficient documentation

## 2018-02-04 DIAGNOSIS — N184 Chronic kidney disease, stage 4 (severe): Secondary | ICD-10-CM | POA: Diagnosis not present

## 2018-02-04 LAB — POCT HEMOGLOBIN-HEMACUE: Hemoglobin: 7 g/dL — ABNORMAL LOW (ref 13.0–17.0)

## 2018-02-04 LAB — IRON AND TIBC
Iron: 55 ug/dL (ref 45–182)
Saturation Ratios: 16 % — ABNORMAL LOW (ref 17.9–39.5)
TIBC: 349 ug/dL (ref 250–450)
UIBC: 294 ug/dL

## 2018-02-04 LAB — FERRITIN: Ferritin: 49 ng/mL (ref 24–336)

## 2018-02-04 MED ORDER — DARBEPOETIN ALFA 200 MCG/0.4ML IJ SOSY
PREFILLED_SYRINGE | INTRAMUSCULAR | Status: AC
  Administered 2018-02-04: 200 ug via SUBCUTANEOUS
  Filled 2018-02-04: qty 0.4

## 2018-02-04 MED ORDER — DARBEPOETIN ALFA 200 MCG/0.4ML IJ SOSY
200.0000 ug | PREFILLED_SYRINGE | INTRAMUSCULAR | Status: DC
  Administered 2018-02-04: 200 ug via SUBCUTANEOUS

## 2018-02-04 NOTE — Progress Notes (Signed)
hemocue today 7.0  Pt asymptomatic.  Called and reported to stacie at [facility].  No new orders received.  Gave pt his injection and instructed him if he had any symptoms to go to the ER and he verbalized understanding.

## 2018-02-10 ENCOUNTER — Encounter (HOSPITAL_COMMUNITY): Payer: Self-pay

## 2018-02-10 ENCOUNTER — Ambulatory Visit (HOSPITAL_COMMUNITY)
Admission: RE | Admit: 2018-02-10 | Discharge: 2018-02-10 | Disposition: A | Discharge status: Home / Self Care | Payer: PPO | Source: Ambulatory Visit | Attending: Cardiology | Admitting: Cardiology

## 2018-02-10 VITALS — BP 154/78 | HR 89 | Wt 154.0 lb

## 2018-02-10 DIAGNOSIS — I5022 Chronic systolic (congestive) heart failure: Secondary | ICD-10-CM

## 2018-02-10 DIAGNOSIS — I130 Hypertensive heart and chronic kidney disease with heart failure and stage 1 through stage 4 chronic kidney disease, or unspecified chronic kidney disease: Secondary | ICD-10-CM | POA: Insufficient documentation

## 2018-02-10 DIAGNOSIS — I5082 Biventricular heart failure: Secondary | ICD-10-CM | POA: Diagnosis not present

## 2018-02-10 DIAGNOSIS — N184 Chronic kidney disease, stage 4 (severe): Secondary | ICD-10-CM | POA: Diagnosis not present

## 2018-02-10 DIAGNOSIS — I428 Other cardiomyopathies: Secondary | ICD-10-CM | POA: Insufficient documentation

## 2018-02-10 DIAGNOSIS — N183 Chronic kidney disease, stage 3 (moderate): Secondary | ICD-10-CM | POA: Insufficient documentation

## 2018-02-10 DIAGNOSIS — D571 Sickle-cell disease without crisis: Secondary | ICD-10-CM | POA: Diagnosis not present

## 2018-02-10 DIAGNOSIS — I4891 Unspecified atrial fibrillation: Secondary | ICD-10-CM | POA: Diagnosis not present

## 2018-02-10 DIAGNOSIS — I10 Essential (primary) hypertension: Secondary | ICD-10-CM

## 2018-02-10 DIAGNOSIS — Z7901 Long term (current) use of anticoagulants: Secondary | ICD-10-CM | POA: Diagnosis not present

## 2018-02-10 DIAGNOSIS — I480 Paroxysmal atrial fibrillation: Secondary | ICD-10-CM | POA: Insufficient documentation

## 2018-02-10 NOTE — Progress Notes (Signed)
Patient ID: Patrick [name], male   DOB: 1955-08-10, 62 y.o.   MRN: [id number]     Advanced Heart Failure Clinic Note   PCP: [name] : Dr Zigmund Daniel  Opthalmologist: [name] HF MD: Dr Marquette Saa   HPI: Patrick [name] is a 62 y.o. male  with a history of sickle cell anemia, systolic HF due to NICM EF 40% 11/2012 initially diagnosed 05/2011, no CAD by cath 05/2011, CKD stage III, legally blind, and tobacco abuse.   Admitted 11/18/12 from HF clinic with voume overload. Hemoglobin 6.7 Transfused 2 UPRBC. Diuresed with IV lasix. Lisinopril stopped due to CKD. Discharge weight 143 pounds.    Admitted to [facility] 3/4 through 08/22/13 with anemia and volume overload. Hemglobin was 5.9. Received 1U PRBC with hemoglobin increased to 6.4 Diuresed with IV lasix. Discharged on carvedilol 25 mg twice a day, lasix 80 mg twice a day, hydralazine 25 mg tid, and imdur 30 mg daily.  D/C weight was 140 pounds.  He is not on Spironolactone due to hyperkalemia. He is not on ace inhibitor due to renal failure.  Admitted 7/6 through 3/32/9518 with A/C systolic heart failure and CKD in setting of new A fib. Started coumadin. Discharge weight was 135 pounds.   Recently admitted 4/3 - 09/24/17 with upper GI bleed and profound anemia with Hgb of 4.6 with Sickle Cell. Transfused with RBCs. EGD showed nonbleeding gastritis, biopsies were obtained. Pathology was pending at discharge. Colonoscopy showed small internal hemorrhoids, otherwise normal. Pt refused resumption of his AC and wished for follow up with his cardiologist prior to starting. Hydralazine, Imdur, and Torsemide were STOPPED.  He presents today for follow up. At last visit switched from coumadin to Eliquis. Feeling great overall. Cr has been stable so no solid plans for HD at this time, but AVF is functioning if need be. No additional torsemide. He remains active. No bleeding on Eliquis and his sickle cell has been well controlled. He denies SOB on flat  ground or with ADLs. No orthopnea, PND, CP, or palpitations. Taking all medications as directed. His BP is slightly elevated on arrival to clinic, but is stable at other visits (120s at visit 2 weeks ago).   Echo 07/2017 LVEF 40-45%, Grade 1 DD, Mod MR, Severe LAE, PA peak pressure 34 mm Hg.   01/17/12 ECHO 55%-60%  11/20/12 ECHO EF 40% Peak PA pressure 56 11/30/13 ECHO 40-45%  12/2015: ECHO 40%.   Labs        02/17/13: K+ 5.0, BUN 45, Creatinine 2.2, proBNP 10135       05/21/13 K 5.4 Creatinine 2.25 Pro BNP 3076        06/09/13 K 4.6 Creatinine 2.1 Pro BNP 10,727        07/07/13 Creatinine 2.06 K 5.0         08/19/13 Creatinine 2.5 Hemoglobin 5.9 Pro BNP 8266         08/27/13 K 4.5 Creatinine 2.78 Hemoglobin 7.2        09/21/13 K 4.2, creatinine 2.56         01/14/14 K 4.7, creatinine 2.29        03/08/14 K 4.8 Creatinine 2.17         12/27/2015: K 3.9 Creatinine 4.05  SH: Lives with his cousin in [place]. Smokes 8 cigarettes per day. Occasional ETOH and no drugs  FH: Mother deceased: liver failure, no CAD        Father deceased: CAD, MI  Review  of systems complete and found to be negative unless listed in HPI.    Past Medical History:  Diagnosis Date  . Blood dyscrasia   . Blood transfusion    "I've had a bunch of them"  . CHF (congestive heart failure) ([place])    followed by hf clinic  . Chronic kidney disease (CKD), stage III (moderate) ([facility])   . Gout   . KGMWNUUV(253.6)    "probably weekly" (01/13/2014)  . Legally blind   . Shortness of breath dyspnea   . Sickle cell disease, type SS ([place])   . Swelling abdomen   . Swelling of extremity, left   . Swelling of extremity, right     Current Outpatient Medications  Medication Sig Dispense Refill  . allopurinol (ZYLOPRIM) 100 MG tablet Take 100 mg by mouth 2 (two) times daily.    Marland Kitchen apixaban (ELIQUIS) 5 MG TABS tablet Take 1 tablet (5 mg total) by mouth 2 (two) times daily. 60 tablet 6  . aspirin EC 81 MG tablet Take 81 mg by mouth  daily.    . calcitRIOL (ROCALTROL) 0.25 MCG capsule Take 0.25 mcg by mouth daily.     . carvedilol (COREG) 12.5 MG tablet Take 1 tablet (12.5 mg total) by mouth 2 (two) times daily with a meal. 60 tablet 6  . folic acid (FOLVITE) 1 MG tablet TAKE 1 TABLET (1 MG TOTAL) BY MOUTH DAILY. 30 tablet 11  . hydrALAZINE (APRESOLINE) 25 MG tablet Take 1 tablet (25 mg total) by mouth 2 (two) times daily. 60 tablet 6  . hydroxyurea (HYDREA) 500 MG capsule TAKE ONE CAPSULE EVERY DAY WITH FOOD TO MINIMIZE GI SIDE EFFECTS 30 capsule 11  . isosorbide mononitrate (IMDUR) 30 MG 24 hr tablet Take 1 tablet (30 mg total) by mouth daily. 30 tablet 6  . oxyCODONE (OXY IR/ROXICODONE) 5 MG immediate release tablet Take 1 tablet (5 mg total) by mouth every 6 (six) hours as needed for severe pain. 45 tablet 0  . pantoprazole (PROTONIX) 40 MG tablet TAKE 1 TABLET (40 MG TOTAL) BY MOUTH DAILY AT 6 (SIX) AM. 30 tablet 0  . torsemide (DEMADEX) 100 MG tablet Take 1 tablet (100 mg total) by mouth daily. 30 tablet 3   No current facility-administered medications for this encounter.    Vitals:   02/10/18 1519  BP: (!) 154/78  Pulse: 89  SpO2: 96%  Weight: 69.9 kg (154 lb)     Wt Readings from Last 3 Encounters:  02/10/18 69.9 kg (154 lb)  02/04/18 68.9 kg (152 lb)  01/27/18 68.9 kg (152 lb)   PHYSICAL EXAM: General: Well appearing. No resp difficulty. HEENT: Normal Neck: Supple. JVP 5-6. Carotids 2+ bilat; no bruits. No thyromegaly or nodule noted. Cor: PMI nondisplaced. RRR, 2/6 TR and 2/6 MR.  Lungs: CTAB, normal effort. Abdomen: Soft, non-tender, non-distended, no HSM. No bruits or masses. +BS  Extremities: No cyanosis or rash. + clubbing. RUE large AVF. +thrill and bruit. No edema.  Neuro: Alert & orientedx3, cranial nerves grossly intact. moves all 4 extremities w/o difficulty. Affect pleasant   ASSESSMENT & PLAN:  1. Chronic Combined Heart Failure:  NICM. Cath 05/2011 without significant coronary disease.   ECHO 12/2015 40%. Echo 2/19 EF 40-45% - NYHA I-II symptoms - Volume status stable on exam.    - Continue torsemide 100 mg daily.   -Continue coreg 12.5 BID. - Continue hydralazine 25 mg BID. Will not increase today with stable pressures elsewhere and tenuous renal function.  -  Continue Imdur 30 mg daily.  - He is not on Ace/Arb/spiro due to hyperkalemia/CKD - Reinforced fluid restriction to < 2 L daily, sodium restriction to less than 2000 mg daily, and the importance of daily weights.   2. HTN: - Meds as above.  3  Sickle Cell:   - Follows closely with Sickle Cell Clinic.  4. Former smoker:   - Stopped 11/2015. No change.  5. CKD stable IV:  - Followed by Dr. Lorrene Reid.  - Has RUE Fistula.  - No change.  6. PAF: CHADSVASC - This patients CHA2DS2-VASc is at least 3.  - EKG shows NSR today.  - Continue Eliquis. Data previously reviewed and Eliquis use is appropriate in Sickle Cell patients, and shows decreased rates bleeding compared to coumadin, similar to Non-Sickle Cell patients.  7. GI bleeding: - EGD showed nonbleeding gastritis, biopsies were obtained. Pathology was pending at discharge. Colonoscopy showed small internal hemorrhoids, otherwise normal.  - No recurrence on Eliquis.   He is doing very well from HF perspective. No bleeding on Eliquis. Recent labs stable. RTC 6 months. Sooner with symptoms.   Shirley Friar, PA-C  02/10/2018

## 2018-02-20 ENCOUNTER — Other Ambulatory Visit: Payer: Self-pay

## 2018-02-20 MED ORDER — PANTOPRAZOLE SODIUM 40 MG PO TBEC: 40.0000 mg | DELAYED_RELEASE_TABLET | Freq: Every day | ORAL | 0 refills | Status: DC

## 2018-03-03 ENCOUNTER — Other Ambulatory Visit (HOSPITAL_COMMUNITY): Payer: Self-pay

## 2018-03-04 ENCOUNTER — Ambulatory Visit (HOSPITAL_COMMUNITY)
Admission: RE | Admit: 2018-03-04 | Discharge: 2018-03-04 | Disposition: A | Discharge status: Home / Self Care | Payer: PPO | Source: Ambulatory Visit | Attending: Nephrology | Admitting: Nephrology

## 2018-03-04 DIAGNOSIS — D631 Anemia in chronic kidney disease: Secondary | ICD-10-CM | POA: Insufficient documentation

## 2018-03-04 DIAGNOSIS — N184 Chronic kidney disease, stage 4 (severe): Secondary | ICD-10-CM

## 2018-03-04 LAB — IRON AND TIBC
Iron: 56 ug/dL (ref 45–182)
Saturation Ratios: 16 % — ABNORMAL LOW (ref 17.9–39.5)
TIBC: 342 ug/dL (ref 250–450)
UIBC: 286 ug/dL

## 2018-03-04 LAB — POCT HEMOGLOBIN-HEMACUE: Hemoglobin: 6.4 g/dL — CL (ref 13.0–17.0)

## 2018-03-04 LAB — FERRITIN: Ferritin: 22 ng/mL — ABNORMAL LOW (ref 24–336)

## 2018-03-04 MED ORDER — DARBEPOETIN ALFA 200 MCG/0.4ML IJ SOSY
200.0000 ug | PREFILLED_SYRINGE | INTRAMUSCULAR | Status: DC
  Administered 2018-03-04: 200 ug via SUBCUTANEOUS

## 2018-03-04 MED ORDER — DARBEPOETIN ALFA 200 MCG/0.4ML IJ SOSY
PREFILLED_SYRINGE | INTRAMUSCULAR | Status: AC
  Administered 2018-03-04: 200 ug via SUBCUTANEOUS
  Filled 2018-03-04: qty 0.4

## 2018-03-04 MED ORDER — SODIUM CHLORIDE 0.9 % IV SOLN
510.0000 mg | Freq: Once | INTRAVENOUS | Status: DC
  Filled 2018-03-04: qty 17

## 2018-03-04 NOTE — Progress Notes (Signed)
Hemocue 6.4 today and was 7.0 last month.  Pt stated he does not feel bad, denies SOB, denies CP, Denies seeing any bleeding.  Pt was due today to receive one dose of feraheme however he stated he could not stay and would come either this Thursday or Friday to get it.  Drew another TIBC and ferritin level today as well.  Reported the above to [place] [facility].  While I was on hold waiting on instructions from Dr Lorrene Reid the pt said his ride was waiting and he had to go "just tell the office to call me".  I gave him his injection and l got his number and let Erline Levine know the above and to call him with any further instructions.

## 2018-03-06 ENCOUNTER — Other Ambulatory Visit (HOSPITAL_COMMUNITY): Payer: Self-pay

## 2018-03-07 ENCOUNTER — Encounter (HOSPITAL_COMMUNITY): Payer: Self-pay

## 2018-03-07 ENCOUNTER — Other Ambulatory Visit: Payer: Self-pay

## 2018-03-07 ENCOUNTER — Encounter (HOSPITAL_COMMUNITY): Payer: PPO

## 2018-03-07 MED ORDER — PANTOPRAZOLE SODIUM 40 MG PO TBEC: 40.0000 mg | DELAYED_RELEASE_TABLET | Freq: Every day | ORAL | 0 refills | Status: DC

## 2018-03-14 ENCOUNTER — Ambulatory Visit (HOSPITAL_COMMUNITY)
Admission: RE | Admit: 2018-03-14 | Discharge: 2018-03-14 | Disposition: A | Discharge status: Home / Self Care | Payer: PPO | Source: Ambulatory Visit | Attending: Nephrology | Admitting: Nephrology

## 2018-03-14 DIAGNOSIS — D631 Anemia in chronic kidney disease: Secondary | ICD-10-CM | POA: Insufficient documentation

## 2018-03-14 DIAGNOSIS — N189 Chronic kidney disease, unspecified: Secondary | ICD-10-CM | POA: Diagnosis not present

## 2018-03-14 LAB — PREPARE RBC (CROSSMATCH)

## 2018-03-14 MED ORDER — SODIUM CHLORIDE 0.9 % IV SOLN
510.0000 mg | Freq: Once | INTRAVENOUS | Status: AC
  Administered 2018-03-14: 510 mg via INTRAVENOUS
  Filled 2018-03-14: qty 17

## 2018-03-14 MED ORDER — SODIUM CHLORIDE 0.9% IV SOLUTION: Freq: Once | INTRAVENOUS | Status: DC

## 2018-03-15 LAB — TYPE AND SCREEN
ABO/RH(D): AB POS
Antibody Screen: NEGATIVE
Unit division: 0

## 2018-03-15 LAB — BPAM RBC
Blood Product Expiration Date: 201910182359
ISSUE DATE / TIME: 201909270923
Unit Type and Rh: 5100

## 2018-03-18 DIAGNOSIS — I120 Hypertensive chronic kidney disease with stage 5 chronic kidney disease or end stage renal disease: Secondary | ICD-10-CM | POA: Diagnosis not present

## 2018-03-18 DIAGNOSIS — D571 Sickle-cell disease without crisis: Secondary | ICD-10-CM | POA: Diagnosis not present

## 2018-03-18 DIAGNOSIS — I770 Arteriovenous fistula, acquired: Secondary | ICD-10-CM | POA: Diagnosis not present

## 2018-03-18 DIAGNOSIS — Z720 Tobacco use: Secondary | ICD-10-CM | POA: Diagnosis not present

## 2018-03-18 DIAGNOSIS — I4891 Unspecified atrial fibrillation: Secondary | ICD-10-CM | POA: Diagnosis not present

## 2018-03-18 DIAGNOSIS — N189 Chronic kidney disease, unspecified: Secondary | ICD-10-CM | POA: Diagnosis not present

## 2018-03-18 DIAGNOSIS — N2581 Secondary hyperparathyroidism of renal origin: Secondary | ICD-10-CM | POA: Diagnosis not present

## 2018-03-18 DIAGNOSIS — I5020 Unspecified systolic (congestive) heart failure: Secondary | ICD-10-CM | POA: Diagnosis not present

## 2018-03-18 DIAGNOSIS — M109 Gout, unspecified: Secondary | ICD-10-CM | POA: Diagnosis not present

## 2018-03-18 DIAGNOSIS — N185 Chronic kidney disease, stage 5: Secondary | ICD-10-CM | POA: Diagnosis not present

## 2018-03-18 DIAGNOSIS — D631 Anemia in chronic kidney disease: Secondary | ICD-10-CM | POA: Diagnosis not present

## 2018-04-01 ENCOUNTER — Ambulatory Visit (HOSPITAL_COMMUNITY)
Admission: RE | Admit: 2018-04-01 | Discharge: 2018-04-01 | Disposition: A | Discharge status: Home / Self Care | Payer: PPO | Source: Ambulatory Visit | Attending: Nephrology | Admitting: Nephrology

## 2018-04-01 VITALS — BP 128/76 | HR 92 | Temp 97.6°F | Resp 20

## 2018-04-01 DIAGNOSIS — N184 Chronic kidney disease, stage 4 (severe): Secondary | ICD-10-CM

## 2018-04-01 DIAGNOSIS — D631 Anemia in chronic kidney disease: Secondary | ICD-10-CM | POA: Insufficient documentation

## 2018-04-01 LAB — POCT HEMOGLOBIN-HEMACUE: Hemoglobin: 8.1 g/dL — ABNORMAL LOW (ref 13.0–17.0)

## 2018-04-01 LAB — IRON AND TIBC
Iron: 70 ug/dL (ref 45–182)
Saturation Ratios: 22 % (ref 17.9–39.5)
TIBC: 323 ug/dL (ref 250–450)
UIBC: 253 ug/dL

## 2018-04-01 LAB — FERRITIN: Ferritin: 42 ng/mL (ref 24–336)

## 2018-04-01 MED ORDER — DARBEPOETIN ALFA 200 MCG/0.4ML IJ SOSY
PREFILLED_SYRINGE | INTRAMUSCULAR | Status: AC
  Filled 2018-04-01: qty 0.4

## 2018-04-01 MED ORDER — DARBEPOETIN ALFA 200 MCG/0.4ML IJ SOSY
200.0000 ug | PREFILLED_SYRINGE | INTRAMUSCULAR | Status: DC
  Administered 2018-04-01: 200 ug via SUBCUTANEOUS

## 2018-04-04 ENCOUNTER — Telehealth: Payer: Self-pay

## 2018-04-04 MED ORDER — OXYCODONE HCL 5 MG PO TABS: 5.0000 mg | ORAL_TABLET | Freq: Four times a day (QID) | ORAL | 0 refills | Status: DC | PRN

## 2018-04-04 NOTE — Telephone Encounter (Signed)
Refilled

## 2018-04-08 ENCOUNTER — Other Ambulatory Visit (HOSPITAL_COMMUNITY): Payer: Self-pay | Admitting: Internal Medicine

## 2018-04-15 ENCOUNTER — Ambulatory Visit (HOSPITAL_COMMUNITY)
Admission: RE | Admit: 2018-04-15 | Discharge: 2018-04-15 | Disposition: A | Discharge status: Home / Self Care | Payer: PPO | Source: Ambulatory Visit | Attending: Nephrology | Admitting: Nephrology

## 2018-04-15 VITALS — BP 139/80 | Resp 18

## 2018-04-15 DIAGNOSIS — N184 Chronic kidney disease, stage 4 (severe): Secondary | ICD-10-CM | POA: Diagnosis not present

## 2018-04-15 DIAGNOSIS — D631 Anemia in chronic kidney disease: Secondary | ICD-10-CM | POA: Insufficient documentation

## 2018-04-15 LAB — POCT HEMOGLOBIN-HEMACUE: Hemoglobin: 8 g/dL — ABNORMAL LOW (ref 13.0–17.0)

## 2018-04-15 MED ORDER — DARBEPOETIN ALFA 200 MCG/0.4ML IJ SOSY
200.0000 ug | PREFILLED_SYRINGE | INTRAMUSCULAR | Status: DC
  Administered 2018-04-15: 200 ug via SUBCUTANEOUS

## 2018-04-15 MED ORDER — DARBEPOETIN ALFA 200 MCG/0.4ML IJ SOSY
PREFILLED_SYRINGE | INTRAMUSCULAR | Status: AC
  Filled 2018-04-15: qty 0.4

## 2018-04-24 DIAGNOSIS — N185 Chronic kidney disease, stage 5: Secondary | ICD-10-CM | POA: Diagnosis not present

## 2018-04-24 DIAGNOSIS — N189 Chronic kidney disease, unspecified: Secondary | ICD-10-CM | POA: Diagnosis not present

## 2018-04-24 DIAGNOSIS — N2581 Secondary hyperparathyroidism of renal origin: Secondary | ICD-10-CM | POA: Diagnosis not present

## 2018-04-24 DIAGNOSIS — I4891 Unspecified atrial fibrillation: Secondary | ICD-10-CM | POA: Diagnosis not present

## 2018-04-24 DIAGNOSIS — D571 Sickle-cell disease without crisis: Secondary | ICD-10-CM | POA: Diagnosis not present

## 2018-04-24 DIAGNOSIS — I5020 Unspecified systolic (congestive) heart failure: Secondary | ICD-10-CM | POA: Diagnosis not present

## 2018-04-24 DIAGNOSIS — Z720 Tobacco use: Secondary | ICD-10-CM | POA: Diagnosis not present

## 2018-04-24 DIAGNOSIS — M109 Gout, unspecified: Secondary | ICD-10-CM | POA: Diagnosis not present

## 2018-04-24 DIAGNOSIS — I770 Arteriovenous fistula, acquired: Secondary | ICD-10-CM | POA: Diagnosis not present

## 2018-04-24 DIAGNOSIS — I120 Hypertensive chronic kidney disease with stage 5 chronic kidney disease or end stage renal disease: Secondary | ICD-10-CM | POA: Diagnosis not present

## 2018-04-24 DIAGNOSIS — D631 Anemia in chronic kidney disease: Secondary | ICD-10-CM | POA: Diagnosis not present

## 2018-04-29 ENCOUNTER — Ambulatory Visit (HOSPITAL_COMMUNITY)
Admission: RE | Admit: 2018-04-29 | Discharge: 2018-04-29 | Disposition: A | Discharge status: Home / Self Care | Payer: PPO | Source: Ambulatory Visit | Attending: Nephrology | Admitting: Nephrology

## 2018-04-29 ENCOUNTER — Encounter (HOSPITAL_COMMUNITY): Payer: PPO

## 2018-04-29 VITALS — BP 138/80 | HR 84 | Resp 18

## 2018-04-29 DIAGNOSIS — N184 Chronic kidney disease, stage 4 (severe): Secondary | ICD-10-CM | POA: Diagnosis not present

## 2018-04-29 LAB — POCT HEMOGLOBIN-HEMACUE: Hemoglobin: 8.3 g/dL — ABNORMAL LOW (ref 13.0–17.0)

## 2018-04-29 LAB — IRON AND TIBC
Iron: 81 ug/dL (ref 45–182)
Saturation Ratios: 22 % (ref 17.9–39.5)
TIBC: 364 ug/dL (ref 250–450)
UIBC: 283 ug/dL

## 2018-04-29 LAB — FERRITIN: Ferritin: 15 ng/mL — ABNORMAL LOW (ref 24–336)

## 2018-04-29 MED ORDER — DARBEPOETIN ALFA 200 MCG/0.4ML IJ SOSY
200.0000 ug | PREFILLED_SYRINGE | INTRAMUSCULAR | Status: DC
  Administered 2018-04-29: 200 ug via SUBCUTANEOUS

## 2018-04-29 MED ORDER — DARBEPOETIN ALFA 200 MCG/0.4ML IJ SOSY
PREFILLED_SYRINGE | INTRAMUSCULAR | Status: AC
  Filled 2018-04-29: qty 0.4

## 2018-04-30 ENCOUNTER — Ambulatory Visit (INDEPENDENT_AMBULATORY_CARE_PROVIDER_SITE_OTHER): Payer: PPO | Admitting: Family Medicine

## 2018-04-30 VITALS — BP 136/79 | HR 90 | Temp 98.0°F | Resp 16 | Ht 71.0 in | Wt 155.0 lb

## 2018-04-30 DIAGNOSIS — Z0000 Encounter for general adult medical examination without abnormal findings: Secondary | ICD-10-CM | POA: Diagnosis not present

## 2018-04-30 NOTE — Progress Notes (Signed)
Subjective:    Patrick [name] is a 62 y.o. male who presents for Medicare Annual/Subsequent preventive examination.   Preventive Screening-Counseling & Management  Tobacco Social History   Tobacco Use  Smoking Status Former Smoker  . Packs/day: 0.25  . Years: 41.00  . Pack years: 10.25  . Types: Cigarettes  . Last attempt to quit: 12/08/2015  . Years since quitting: 2.3  Smokeless Tobacco Never Used    Problems Prior to Visit: Current Problems (verified) Patient Active Problem List   Diagnosis Date Noted  . Acute GI bleeding 09/19/2017  . Symptomatic anemia   . Chronic heart failure with preserved ejection fraction ([place])   . Colon cancer screening 07/16/2017  . Encounter for therapeutic drug monitoring 12/29/2015  . 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  . Atrial fibrillation ([place]) 12/24/2015  . Cough with sputum 10/25/2015  . Muscle tightness-Right arm  05/27/2014  . Arm pain, right 05/06/2014  . End stage renal disease ([place]) 05/06/2014  . Chest pain 01/13/2014  . Vitamin D deficiency 09/25/2013  . CKD (chronic kidney disease), stage IV ([place]) 09/25/2013  . Elevated uric acid in blood 09/25/2013  . Hb-SS disease without crisis ([place]) 09/25/2013  . Anemia 09/25/2013  . Onychomycosis of toenail 08/27/2013  . Cellulitis 08/27/2013  . Hyperglycemia 08/27/2013  . CHF (congestive heart failure) ([place]) 08/19/2013  . Sickle cell anemia ([place]) 11/21/2012  . Abdominal pain 01/17/2012  . Tobacco abuse 10/29/2011  . NSVT (nonsustained ventricular tachycardia) ([place]) 10/21/2011  . Gout 10/19/2011  . Sickle cell crisis ([place]) 10/17/2011  . HTN (hypertension) 06/07/2011    Medications Prior to Visit  Current Medications (verified) Current Outpatient Medications  Medication Sig Dispense Refill  . allopurinol (ZYLOPRIM) 100 MG tablet Take 100 mg by mouth 2 (two) times daily.    Marland Kitchen apixaban (ELIQUIS) 5 MG TABS tablet Take 1 tablet (5 mg total) by mouth 2 (two) times daily. 60 tablet 6   . aspirin EC 81 MG tablet Take 81 mg by mouth daily.    . calcitRIOL (ROCALTROL) 0.25 MCG capsule Take 0.25 mcg by mouth daily.     . carvedilol (COREG) 12.5 MG tablet Take 1 tablet (12.5 mg total) by mouth 2 (two) times daily with a meal. 60 tablet 6  . folic acid (FOLVITE) 1 MG tablet TAKE 1 TABLET (1 MG TOTAL) BY MOUTH DAILY. 30 tablet 11  . hydrALAZINE (APRESOLINE) 25 MG tablet Take 1 tablet (25 mg total) by mouth 2 (two) times daily. 60 tablet 6  . hydroxyurea (HYDREA) 500 MG capsule TAKE ONE CAPSULE EVERY DAY WITH FOOD TO MINIMIZE GI SIDE EFFECTS 30 capsule 11  . isosorbide mononitrate (IMDUR) 30 MG 24 hr tablet Take 1 tablet (30 mg total) by mouth daily. 30 tablet 6  . oxyCODONE (OXY IR/ROXICODONE) 5 MG immediate release tablet Take 1 tablet (5 mg total) by mouth every 6 (six) hours as needed for severe pain. 45 tablet 0  . pantoprazole (PROTONIX) 40 MG tablet Take 1 tablet (40 mg total) by mouth daily at 6 (six) AM. 30 tablet 0  . torsemide (DEMADEX) 100 MG tablet Take 1 tablet (100 mg total) by mouth daily. 30 tablet 3  . torsemide (DEMADEX) 100 MG tablet TAKE 1 TABLET BY MOUTH EVERY DAY 90 tablet 1   No current facility-administered medications for this visit.      Allergies (verified) Patient has no known allergies.   PAST HISTORY  Family History Family History  Problem Relation Age of Onset  .  Alcohol abuse Mother   . Liver disease Mother   . Cirrhosis Mother   . Heart attack Mother     Social History Social History   Tobacco Use  . Smoking status: Former Smoker    Packs/day: 0.25    Years: 41.00    Pack years: 10.25    Types: Cigarettes    Last attempt to quit: 12/08/2015    Years since quitting: 2.3  . Smokeless tobacco: Never Used  Substance Use Topics  . Alcohol use: Yes    Alcohol/week: 3.0 standard drinks    Types: 1 Glasses of wine, 2 Cans of beer per week    Comment: occasional    Are there smokers in your home (other than you)?  No  Risk  Factors Current exercise habits: Home exercise routine includes walking 1.5 hrs per weeks.  Dietary issues discussed: Importance of eating a heart healthy diet   Cardiac risk factors: advanced age (older than 17 for men, 67 for women) and family history of premature cardiovascular disease.  Depression Screen (Note: if answer to either of the following is "Yes", a more complete depression screening is indicated)   Q1: Over the past two weeks, have you felt down, depressed or hopeless? No  Q2: Over the past two weeks, have you felt little interest or pleasure in doing things? No  Have you lost interest or pleasure in daily life? No  Do you often feel hopeless? No  Do you cry easily over simple problems? No  Activities of Daily Living In your present state of health, do you have any difficulty performing the following activities?:  Driving? Yes Managing money?  No Feeding yourself? No Getting from bed to chair? No Climbing a flight of stairs? No Preparing food and eating?: No Bathing or showering? No Getting dressed: No Getting to the toilet? No Using the toilet:No Moving around from place to place: No In the past year have you fallen or had a near fall?:No   Are you sexually active?  Yes  Do you have more than one partner?  No  Hearing Difficulties: No Do you often ask people to speak up or repeat themselves? No Do you experience ringing or noises in your ears? No Do you have difficulty understanding soft or whispered voices? No   Do you feel that you have a problem with memory? No  Do you often misplace items? No  Do you feel safe at home?  Yes  Cognitive Testing  Alert? Yes  Normal Appearance?Yes  Oriented to person? Yes  Place? Yes   Time? Yes  Recall of three objects?  Yes  Can perform simple calculations? Yes  Displays appropriate judgment?Yes  Can read the correct time from a watch face?Yes   Advanced Directives have been discussed with the patient? Yes   List  the Names of Other Physician/Practitioners you currently use:   Indicate any recent Medical Services you may have received from other than [facility] providers in the past year (date may be approximate).  Immunization History  Administered Date(s) Administered  . Influenza,inj,Quad PF,6+ Mos 03/17/2013, 03/08/2014  . Pneumococcal Conjugate-13 12/02/2014  . Pneumococcal Polysaccharide-23 02/10/2013  . Tdap 09/24/2013    Screening Tests Health Maintenance  Topic Date Due  . TETANUS/TDAP  09/25/2023  . COLONOSCOPY  09/25/2027  . Hepatitis C Screening  Completed  . HIV Screening  Completed    All answers were reviewed with the patient and necessary referrals were made:  Lanae Boast, FNP  04/30/2018   History reviewed: allergies, current medications, past family history, past medical history, past social history, past surgical history and problem list  Review of Systems A comprehensive review of systems was negative.    Objective:     Vision by Snellen chart: right [id number] is legally blind and followed by Dr. Zadie Rhine, left BWI:[id number] Blind and followed by Dr. Zadie Rhine Blood pressure 136/79, pulse 90, temperature 98 F (36.7 C), temperature source Oral, resp. rate 16, height 5\' 11"  (1.803 m), weight 155 lb (70.3 kg), SpO2 99 %. Body mass index is 21.62 kg/m.  General appearance: alert, cooperative, appears stated age and no distress Head: Normocephalic, without obvious abnormality, atraumatic Lungs: clear to auscultation bilaterally Heart: irregularly irregular rhythm Neurologic: Alert and oriented X 3, normal strength and tone. Normal symmetric reflexes. Normal coordination and gait     Assessment:     Medicare Wellness Exam     Plan:     During the course of the visit the patient was educated and counseled about appropriate screening and preventive services including:    Pneumococcal vaccine   Influenza vaccine  Hepatitis B vaccine  Td vaccine  Prostate  cancer screening  Colorectal cancer screening  Diabetes screening  Nutrition counseling   Smoking cessation counseling  Advanced directives: power of attorney for healthcare on file  Diet review for nutrition referral? Yes ____  Not Indicated _x___   Patient Instructions (the written plan) was given to the patient.  Medicare Attestation I have personally reviewed: The patient's medical and social history Their use of alcohol, tobacco or illicit drugs Their current medications and supplements The patient's functional ability including ADLs,fall risks, home safety risks, cognitive, and hearing and visual impairment Diet and physical activities Evidence for depression or mood disorders  The patient's weight, height, BMI, and visual acuity have been recorded in the chart.  I have made referrals, counseling, and provided education to the patient based on review of the above and I have provided the patient with a written personalized care plan for preventive services.     Lanae Boast, FNP   04/30/2018

## 2018-05-01 ENCOUNTER — Encounter: Payer: Self-pay | Admitting: Family Medicine

## 2018-05-01 ENCOUNTER — Telehealth: Payer: Self-pay | Admitting: Family Medicine

## 2018-05-01 NOTE — Telephone Encounter (Signed)
Please let patient know that I am reviewing his chart before prescribing cialis or viagra. I want to make sure that this medication will be safe for him to take due to his heart and kidney issues. Please allow me one week to follow up. Thanks.

## 2018-05-01 NOTE — Telephone Encounter (Signed)
Called and spoke with patient. Advised that we will call in back in a week to follow up to see if he can have viagra. Thanks!

## 2018-05-01 NOTE — Patient Instructions (Signed)
Erectile Dysfunction Erectile dysfunction (ED) is the inability to get or keep an erection in order to have sexual intercourse. Erectile dysfunction may include:  Inability to get an erection.  Lack of enough hardness of the erection to allow penetration.  Loss of the erection before sex is finished. What are the causes? This condition may be caused by:  Certain medicines, such as:  Pain relievers.  Antihistamines.  Antidepressants.  Blood pressure medicines.  Water pills (diuretics).  Ulcer medicines.  Muscle relaxants.  Drugs.  Excessive drinking.  Psychological causes, such as:  Anxiety.  Depression.  Sadness.  Exhaustion.  Performance fear.  Stress.  Physical causes, such as:  Artery problems. This may include diabetes, smoking, liver disease, or atherosclerosis.  High blood pressure.  Hormonal problems, such as low testosterone.  Obesity.  Nerve problems. This may include back or pelvic injuries, diabetes mellitus, multiple sclerosis, or Parkinson disease. What are the signs or symptoms? Symptoms of this condition include:  Inability to get an erection.  Lack of enough hardness of the erection to allow penetration.  Loss of the erection before sex is finished.  Normal erections at some times, but with frequent unsatisfactory episodes.  Low sexual satisfaction in either partner due to erection problems.  A curved penis occurring with erection. The curve may cause pain or the penis may be too curved to allow for intercourse.  Never having nighttime erections. How is this diagnosed? This condition is often diagnosed by:  Performing a physical exam to find other diseases or specific problems with the penis.  Asking you detailed questions about the problem.  Performing blood tests to check for diabetes mellitus or to measure hormone levels.  Performing other tests to check for underlying health conditions.  Performing an ultrasound  exam to check for scarring.  Performing a test to check blood flow to the penis.  Doing a sleep study at home to measure nighttime erections. How is this treated? This condition may be treated by:  Medicine taken by mouth to help you achieve an erection (oral medicine).  Hormone replacement therapy to replace low testosterone levels.  Medicine that is injected into the penis. Your health care provider may instruct you how to give yourself these injections at home.  Vacuum pump. This is a pump with a ring on it. The pump and ring are placed on the penis and used to create pressure that helps the penis become erect.  Penile implant surgery. In this procedure, you may receive:  An inflatable implant. This consists of cylinders, a pump, and a reservoir. The cylinders can be inflated with a fluid that helps to create an erection, and they can be deflated after intercourse.  A semi-rigid implant. This consists of two silicone rubber rods. The rods provide some rigidity. They are also flexible, so the penis can both curve downward in its normal position and become straight for sexual intercourse.  Blood vessel surgery, to improve blood flow to the penis. During this procedure, a blood vessel from a different part of the body is placed into the penis to allow blood to flow around (bypass) damaged or blocked blood vessels.  Lifestyle changes, such as exercising more, losing weight, and quitting smoking. Follow these instructions at home: Medicines   Take over-the-counter and prescription medicines only as told by your health care provider. Do not increase the dosage without first discussing it with your health care provider.  If you are using self-injections, perform injections as directed by your   health care provider. Make sure to avoid any veins that are on the surface of the penis. After giving an injection, apply pressure to the injection site for 5 minutes. General instructions    Exercise regularly, as directed by your health care provider. Work with your health care provider to lose weight, if needed.  Do not use any products that contain nicotine or tobacco, such as cigarettes and e-cigarettes. If you need help quitting, ask your health care provider.  Before using a vacuum pump, read the instructions that come with the pump and discuss any questions with your health care provider.  Keep all follow-up visits as told by your health care provider. This is important. Contact a health care provider if:  You feel nauseous.  You vomit. Get help right away if:  You are taking oral or injectable medicines and you have an erection that lasts longer than 4 hours. If your health care provider is unavailable, go to the nearest emergency room for evaluation. An erection that lasts much longer than 4 hours can result in permanent damage to your penis.  You have severe pain in your groin or abdomen.  You develop redness or severe swelling of your penis.  You have redness spreading up into your groin or lower abdomen.  You are unable to urinate.  You experience chest pain or a rapid heart beat (palpitations) after taking oral medicines. Summary  Erectile dysfunction (ED) is the inability to get or keep an erection during sexual intercourse. This problem can usually be treated successfully.  This condition is diagnosed based on a physical exam, your symptoms, and tests to determine the cause. Treatment varies depending on the cause, and may include medicines, hormone therapy, surgery, or vacuum pump.  You may need follow-up visits to make sure that you are using your medicines or devices correctly.  Get help right away if you are taking or injecting medicines and you have an erection that lasts longer than 4 hours. This information is not intended to replace advice given to you by your health care provider. Make sure you discuss any questions you have with your health  care provider. Document Released: 06/01/2000 Document Revised: 06/20/2016 Document Reviewed: 06/20/2016 Elsevier Interactive Patient Education  2017 [employer]  

## 2018-05-05 ENCOUNTER — Other Ambulatory Visit (HOSPITAL_COMMUNITY): Payer: Self-pay | Admitting: Internal Medicine

## 2018-05-13 ENCOUNTER — Encounter (HOSPITAL_COMMUNITY): Payer: PPO

## 2018-05-14 ENCOUNTER — Other Ambulatory Visit (HOSPITAL_COMMUNITY): Payer: Self-pay | Admitting: Internal Medicine

## 2018-06-02 ENCOUNTER — Encounter (HOSPITAL_COMMUNITY): Payer: Self-pay | Admitting: Internal Medicine

## 2018-06-02 ENCOUNTER — Inpatient Hospital Stay (HOSPITAL_COMMUNITY)
Admission: EM | Admit: 2018-06-02 | Discharge: 2018-06-06 | DRG: 291 | Disposition: A | Discharge status: Home / Self Care | Payer: PPO | Attending: Internal Medicine | Admitting: Internal Medicine

## 2018-06-02 ENCOUNTER — Other Ambulatory Visit: Payer: Self-pay

## 2018-06-02 ENCOUNTER — Emergency Department (HOSPITAL_COMMUNITY): Payer: PPO

## 2018-06-02 DIAGNOSIS — Z87891 Personal history of nicotine dependence: Secondary | ICD-10-CM | POA: Diagnosis not present

## 2018-06-02 DIAGNOSIS — I5043 Acute on chronic combined systolic (congestive) and diastolic (congestive) heart failure: Secondary | ICD-10-CM | POA: Diagnosis present

## 2018-06-02 DIAGNOSIS — Z811 Family history of alcohol abuse and dependence: Secondary | ICD-10-CM

## 2018-06-02 DIAGNOSIS — I428 Other cardiomyopathies: Secondary | ICD-10-CM | POA: Diagnosis present

## 2018-06-02 DIAGNOSIS — I361 Nonrheumatic tricuspid (valve) insufficiency: Secondary | ICD-10-CM | POA: Diagnosis not present

## 2018-06-02 DIAGNOSIS — I132 Hypertensive heart and chronic kidney disease with heart failure and with stage 5 chronic kidney disease, or end stage renal disease: Principal | ICD-10-CM | POA: Diagnosis present

## 2018-06-02 DIAGNOSIS — I10 Essential (primary) hypertension: Secondary | ICD-10-CM | POA: Diagnosis not present

## 2018-06-02 DIAGNOSIS — I340 Nonrheumatic mitral (valve) insufficiency: Secondary | ICD-10-CM | POA: Diagnosis not present

## 2018-06-02 DIAGNOSIS — Z8249 Family history of ischemic heart disease and other diseases of the circulatory system: Secondary | ICD-10-CM

## 2018-06-02 DIAGNOSIS — Z720 Tobacco use: Secondary | ICD-10-CM | POA: Diagnosis not present

## 2018-06-02 DIAGNOSIS — H548 Legal blindness, as defined in USA: Secondary | ICD-10-CM | POA: Diagnosis present

## 2018-06-02 DIAGNOSIS — R079 Chest pain, unspecified: Secondary | ICD-10-CM | POA: Diagnosis not present

## 2018-06-02 DIAGNOSIS — R188 Other ascites: Secondary | ICD-10-CM | POA: Diagnosis not present

## 2018-06-02 DIAGNOSIS — D571 Sickle-cell disease without crisis: Secondary | ICD-10-CM | POA: Diagnosis present

## 2018-06-02 DIAGNOSIS — I5042 Chronic combined systolic (congestive) and diastolic (congestive) heart failure: Secondary | ICD-10-CM

## 2018-06-02 DIAGNOSIS — D509 Iron deficiency anemia, unspecified: Secondary | ICD-10-CM | POA: Diagnosis not present

## 2018-06-02 DIAGNOSIS — I4892 Unspecified atrial flutter: Secondary | ICD-10-CM | POA: Diagnosis not present

## 2018-06-02 DIAGNOSIS — D5700 Hb-SS disease with crisis, unspecified: Secondary | ICD-10-CM | POA: Diagnosis not present

## 2018-06-02 DIAGNOSIS — R0902 Hypoxemia: Secondary | ICD-10-CM

## 2018-06-02 DIAGNOSIS — Z7901 Long term (current) use of anticoagulants: Secondary | ICD-10-CM

## 2018-06-02 DIAGNOSIS — I509 Heart failure, unspecified: Secondary | ICD-10-CM

## 2018-06-02 DIAGNOSIS — E559 Vitamin D deficiency, unspecified: Secondary | ICD-10-CM | POA: Diagnosis present

## 2018-06-02 DIAGNOSIS — Z7982 Long term (current) use of aspirin: Secondary | ICD-10-CM | POA: Diagnosis not present

## 2018-06-02 DIAGNOSIS — I5022 Chronic systolic (congestive) heart failure: Secondary | ICD-10-CM | POA: Diagnosis not present

## 2018-06-02 DIAGNOSIS — I4891 Unspecified atrial fibrillation: Secondary | ICD-10-CM

## 2018-06-02 DIAGNOSIS — Z79899 Other long term (current) drug therapy: Secondary | ICD-10-CM | POA: Diagnosis not present

## 2018-06-02 DIAGNOSIS — I480 Paroxysmal atrial fibrillation: Secondary | ICD-10-CM | POA: Diagnosis present

## 2018-06-02 DIAGNOSIS — N186 End stage renal disease: Secondary | ICD-10-CM | POA: Diagnosis present

## 2018-06-02 DIAGNOSIS — D638 Anemia in other chronic diseases classified elsewhere: Secondary | ICD-10-CM | POA: Diagnosis not present

## 2018-06-02 DIAGNOSIS — I4819 Other persistent atrial fibrillation: Secondary | ICD-10-CM | POA: Diagnosis not present

## 2018-06-02 DIAGNOSIS — D649 Anemia, unspecified: Secondary | ICD-10-CM | POA: Diagnosis present

## 2018-06-02 DIAGNOSIS — R0602 Shortness of breath: Secondary | ICD-10-CM | POA: Diagnosis not present

## 2018-06-02 DIAGNOSIS — J9601 Acute respiratory failure with hypoxia: Secondary | ICD-10-CM | POA: Diagnosis present

## 2018-06-02 DIAGNOSIS — I4821 Permanent atrial fibrillation: Secondary | ICD-10-CM | POA: Diagnosis present

## 2018-06-02 DIAGNOSIS — I110 Hypertensive heart disease with heart failure: Secondary | ICD-10-CM | POA: Diagnosis not present

## 2018-06-02 LAB — COMPREHENSIVE METABOLIC PANEL
ALT: 37 U/L (ref 0–44)
AST: 48 U/L — ABNORMAL HIGH (ref 15–41)
Albumin: 3.4 g/dL — ABNORMAL LOW (ref 3.5–5.0)
Alkaline Phosphatase: 63 U/L (ref 38–126)
Anion gap: 12 (ref 5–15)
BUN: 91 mg/dL — ABNORMAL HIGH (ref 6–20)
CO2: 26 mmol/L (ref 22–32)
Calcium: 8.7 mg/dL — ABNORMAL LOW (ref 8.9–10.3)
Chloride: 98 mmol/L (ref 98–111)
Creatinine, Ser: 5.91 mg/dL — ABNORMAL HIGH (ref 0.61–1.24)
GFR calc Af Amer: 11 mL/min — ABNORMAL LOW (ref >60)
GFR calc non Af Amer: 10 mL/min — ABNORMAL LOW (ref >60)
Glucose, Bld: 97 mg/dL (ref 70–99)
Potassium: 4.2 mmol/L (ref 3.5–5.1)
Sodium: 136 mmol/L (ref 135–145)
Total Bilirubin: 1.5 mg/dL — ABNORMAL HIGH (ref 0.3–1.2)
Total Protein: 6.4 g/dL — ABNORMAL LOW (ref 6.5–8.1)

## 2018-06-02 LAB — CBC WITH DIFFERENTIAL/PLATELET
Abs Immature Granulocytes: 0.12 10*3/uL — ABNORMAL HIGH (ref 0.00–0.07)
Basophils Absolute: 0.1 10*3/uL (ref 0.0–0.1)
Basophils Relative: 1 %
Eosinophils Absolute: 0.2 10*3/uL (ref 0.0–0.5)
Eosinophils Relative: 3 %
HCT: 18.6 % — ABNORMAL LOW (ref 39.0–52.0)
Hemoglobin: 6.4 g/dL — CL (ref 13.0–17.0)
Immature Granulocytes: 2 %
Lymphocytes Relative: 8 %
Lymphs Abs: 0.6 10*3/uL — ABNORMAL LOW (ref 0.7–4.0)
MCH: 36 pg — ABNORMAL HIGH (ref 26.0–34.0)
MCHC: 34.4 g/dL (ref 30.0–36.0)
MCV: 104.5 fL — ABNORMAL HIGH (ref 80.0–100.0)
Monocytes Absolute: 1.2 10*3/uL — ABNORMAL HIGH (ref 0.1–1.0)
Monocytes Relative: 17 %
Neutro Abs: 5.1 10*3/uL (ref 1.7–7.7)
Neutrophils Relative %: 69 %
Platelets: 128 10*3/uL — ABNORMAL LOW (ref 150–400)
RBC: 1.78 MIL/uL — ABNORMAL LOW (ref 4.22–5.81)
RDW: 20.8 % — ABNORMAL HIGH (ref 11.5–15.5)
WBC: 7.3 10*3/uL (ref 4.0–10.5)
nRBC: 12.4 % — ABNORMAL HIGH (ref 0.0–0.2)

## 2018-06-02 LAB — URINALYSIS, ROUTINE W REFLEX MICROSCOPIC
Bacteria, UA: NONE SEEN
Bilirubin Urine: NEGATIVE
Glucose, UA: NEGATIVE mg/dL
Ketones, ur: NEGATIVE mg/dL
Leukocytes, UA: NEGATIVE
Nitrite: NEGATIVE
Protein, ur: 30 mg/dL — AB
Specific Gravity, Urine: 1.006 (ref 1.005–1.030)
pH: 7 (ref 5.0–8.0)

## 2018-06-02 LAB — I-STAT TROPONIN, ED: Troponin i, poc: 0.02 ng/mL (ref 0.00–0.08)

## 2018-06-02 LAB — PREPARE RBC (CROSSMATCH)

## 2018-06-02 LAB — BRAIN NATRIURETIC PEPTIDE: B Natriuretic Peptide: 1644.3 pg/mL — ABNORMAL HIGH (ref 0.0–100.0)

## 2018-06-02 MED ORDER — FUROSEMIDE 10 MG/ML IJ SOLN
80.0000 mg | Freq: Two times a day (BID) | INTRAMUSCULAR | Status: DC
  Filled 2018-06-02: qty 8

## 2018-06-02 MED ORDER — PANTOPRAZOLE SODIUM 40 MG PO TBEC
40.0000 mg | DELAYED_RELEASE_TABLET | Freq: Every day | ORAL | Status: DC
  Administered 2018-06-02 – 2018-06-06 (×5): 40 mg via ORAL
  Filled 2018-06-02 (×5): qty 1

## 2018-06-02 MED ORDER — FUROSEMIDE 10 MG/ML IJ SOLN
80.0000 mg | Freq: Once | INTRAMUSCULAR | Status: AC
  Administered 2018-06-02: 80 mg via INTRAVENOUS
  Filled 2018-06-02: qty 8

## 2018-06-02 MED ORDER — CARVEDILOL 3.125 MG PO TABS
3.1250 mg | ORAL_TABLET | Freq: Two times a day (BID) | ORAL | Status: DC
  Administered 2018-06-02: 3.125 mg via ORAL
  Filled 2018-06-02 (×3): qty 1

## 2018-06-02 MED ORDER — CARVEDILOL 12.5 MG PO TABS: 12.5000 mg | ORAL_TABLET | Freq: Two times a day (BID) | ORAL | Status: DC

## 2018-06-02 MED ORDER — ISOSORBIDE MONONITRATE ER 30 MG PO TB24
30.0000 mg | ORAL_TABLET | Freq: Every day | ORAL | Status: DC
  Administered 2018-06-02 – 2018-06-06 (×5): 30 mg via ORAL
  Filled 2018-06-02 (×5): qty 1

## 2018-06-02 MED ORDER — APIXABAN 5 MG PO TABS
5.0000 mg | ORAL_TABLET | Freq: Two times a day (BID) | ORAL | Status: DC
  Administered 2018-06-02 – 2018-06-06 (×8): 5 mg via ORAL
  Filled 2018-06-02 (×8): qty 1

## 2018-06-02 MED ORDER — HYDROXYUREA 500 MG PO CAPS
500.0000 mg | ORAL_CAPSULE | Freq: Every day | ORAL | Status: DC
  Administered 2018-06-03 – 2018-06-06 (×4): 500 mg via ORAL
  Filled 2018-06-02 (×4): qty 1

## 2018-06-02 MED ORDER — TORSEMIDE 100 MG PO TABS: 100.0000 mg | ORAL_TABLET | Freq: Every day | ORAL | Status: DC

## 2018-06-02 MED ORDER — SODIUM CHLORIDE 0.9% IV SOLUTION
Freq: Once | INTRAVENOUS | Status: AC
  Administered 2018-06-02: 13:00:00 via INTRAVENOUS

## 2018-06-02 MED ORDER — ASPIRIN EC 81 MG PO TBEC
81.0000 mg | DELAYED_RELEASE_TABLET | Freq: Every day | ORAL | Status: DC
  Administered 2018-06-02 – 2018-06-06 (×5): 81 mg via ORAL
  Filled 2018-06-02 (×5): qty 1

## 2018-06-02 MED ORDER — HYDRALAZINE HCL 25 MG PO TABS
25.0000 mg | ORAL_TABLET | Freq: Two times a day (BID) | ORAL | Status: DC
  Administered 2018-06-02 – 2018-06-06 (×8): 25 mg via ORAL
  Filled 2018-06-02 (×9): qty 1

## 2018-06-02 MED ORDER — HYDROXYUREA 500 MG PO CAPS: 500.0000 mg | ORAL_CAPSULE | Freq: Every day | ORAL | Status: DC

## 2018-06-02 MED ORDER — APIXABAN 5 MG PO TABS: 5.0000 mg | ORAL_TABLET | Freq: Two times a day (BID) | ORAL | Status: DC

## 2018-06-02 MED ORDER — OXYCODONE HCL 5 MG PO TABS: 5.0000 mg | ORAL_TABLET | Freq: Four times a day (QID) | ORAL | Status: DC | PRN

## 2018-06-02 MED ORDER — CALCITRIOL 0.5 MCG PO CAPS
0.5000 ug | ORAL_CAPSULE | Freq: Every day | ORAL | Status: DC
  Administered 2018-06-03 – 2018-06-06 (×4): 0.5 ug via ORAL
  Filled 2018-06-02 (×4): qty 1

## 2018-06-02 MED ORDER — FOLIC ACID 1 MG PO TABS
1.0000 mg | ORAL_TABLET | Freq: Every day | ORAL | Status: DC
  Administered 2018-06-02 – 2018-06-06 (×5): 1 mg via ORAL
  Filled 2018-06-02 (×5): qty 1

## 2018-06-02 NOTE — Consult Note (Addendum)
Advanced Heart Failure Team Consult Note   Primary Physician: [name], MD PCP-Cardiologist:  Dr Vaughan Browner  Nephrologist: Dr Lorrene Reid  Reason for Consultation: Heart Failure   HPI:    Patrick [name] is seen today for evaluation of heart failure at the request of Dr  Tamera Punt   Patrick [name] is a legally blind 62 year old with a history of sickle cell anemia, gout, chronic systolic heart failure, NICM, CKD Stage IV (has RUE AVF) , PAF, GI bleed (coumadin stopped and eliquis started), and tobacco abuse.   He has been in followed for the last 5 years in the heart failure clinic. He first went into Afib in 2017. He was placed on coumadin and continued on carvedilol. He spontaneously converted and did not require cardioversion.   Last admitted in April of this year for GI bleed. Hgb had dropped to 4.6. He was transfused RBCs. He underwent EGD and colonoscopy. This showed non bleeding gastritis and hemorrhoids. Coumadin was stopped. He was later started on eliquis and has not had any bleeding. It should also be noted he was in NSR at that time.   He has been followed by Dr Lorrene Reid for CKD. He has been getting aranesp every other week. He has had fistula for the last 4 years but has not started dialysis.   Over the last 7 days he has noticed increased shortness of breath. He continued to go to work everyday but today he was short of breath at rest. Says he has been sleeping sitting straight up. He contacted [facility] and presented to [facility] for an evaluation.  He has been taking all medications and took eliquis this morning.  Denies BRBPR. Weight at home had gone up from 150-->155 pounds.   Today he presented to [facility] ED with increased shortness of breath. CXR with mild edema. Pertinent labs included: hgb 6.4, creatinine 5.9, K 4.2, and BNP 1644. EKG showed A fib 75 bpm.  In the ED he has been  Given IV lasix and started on blood.   Echo 07/19/2017 -EF 40-45% Grade IDD  Review of Systems: [y] =  yes, [ ]  = no   General: Weight gain [Y ]; Weight loss [ ] ; Anorexia [ ] ; Fatigue [Y ]; Fever [ ] ; Chills [ ] ; Weakness [ ]   Cardiac: Chest pain/pressure [ ] ; Resting SOB [ Y]; Exertional SOB [Y ]; Orthopnea [ Y]; Pedal Edema [ ] ; Palpitations [ ] ; Syncope [ ] ; Presyncope [Y ]; Paroxysmal nocturnal dyspnea[ ]   Pulmonary: Cough [ ] ; Wheezing[ ] ; Hemoptysis[ ] ; Sputum [ ] ; Snoring [ ]   GI: Vomiting[ ] ; Dysphagia[ ] ; Melena[ ] ; Hematochezia [ ] ; Heartburn[ ] ; Abdominal pain [ ] ; Constipation [ ] ; Diarrhea [ ] ; BRBPR [ ]   GU: Hematuria[ ] ; Dysuria [ ] ; Nocturia[ ]   Vascular: Pain in legs with walking [ ] ; Pain in feet with lying flat [ ] ; Non-healing sores [ ] ; Stroke [ ] ; TIA [ ] ; Slurred speech [ ] ;  Neuro: Headaches[ ] ; Vertigo[ ] ; Seizures[ ] ; Paresthesias[ ] ;Blurred vision [ ] ; Diplopia [ ] ; Vision changes [ ]   Ortho/Skin: Arthritis [ ] ; Joint pain [Y ]; Muscle pain [ ] ; Joint swelling [ ] ; Back Pain [ ] Y; Rash [ ]   Psych: Depression[ ] ; Anxiety[ ]   Heme: Bleeding problems [ ] ; Clotting disorders [ ] ; Anemia [ ]   Endocrine: Diabetes [ ] ; Thyroid dysfunction[ ]   Home Medications Prior to Admission medications   Medication Sig Start Date End Date Taking?  Authorizing Provider  allopurinol (ZYLOPRIM) 100 MG tablet Take 100 mg by mouth 2 (two) times daily.   Yes [provider]  aspirin EC 81 MG tablet Take 81 mg by mouth daily.   Yes [provider]  calcitRIOL (ROCALTROL) 0.5 MCG capsule Take 0.5 mcg by mouth daily. 04/15/18  Yes [provider]  carvedilol (COREG) 12.5 MG tablet Take 1 tablet (12.5 mg total) by mouth 2 (two) times daily with a meal. 10/10/17  Yes Donevan Biller, Shaune Pascal, MD  ELIQUIS 5 MG TABS tablet TAKE 1 TABLET BY MOUTH TWICE A DAY Patient taking differently: Take 5 mg by mouth 2 (two) times daily.  05/14/18  Yes Emonee Winkowski, Shaune Pascal, MD  folic acid (FOLVITE) 1 MG tablet TAKE 1 TABLET (1 MG TOTAL) BY MOUTH DAILY. Patient taking differently: Take 1 mg by  mouth daily.  10/10/17  Yes Dorena Dew, FNP  hydrALAZINE (APRESOLINE) 25 MG tablet Take 1 tablet (25 mg total) by mouth 2 (two) times daily. 10/10/17 06/02/18 Yes Laiya Wisby, Shaune Pascal, MD  hydroxyurea (HYDREA) 500 MG capsule TAKE ONE CAPSULE EVERY DAY WITH FOOD TO MINIMIZE GI SIDE EFFECTS Patient taking differently: Take 500 mg by mouth daily. TAKE WITH FOOD TO MINIMIZE GI SIDE EFFECTS 01/20/18  Yes Jegede, Olugbemiga E, MD  isosorbide mononitrate (IMDUR) 30 MG 24 hr tablet TAKE 1 TABLET BY MOUTH EVERY DAY Patient taking differently: Take 30 mg by mouth daily.  05/05/18  Yes Brewster Wolters, Shaune Pascal, MD  torsemide (DEMADEX) 100 MG tablet Take 1 tablet (100 mg total) by mouth daily. 01/20/18 06/02/18 Yes Jegede, Marlena Clipper, MD  oxyCODONE (OXY IR/ROXICODONE) 5 MG immediate release tablet Take 1 tablet (5 mg total) by mouth every 6 (six) hours as needed for severe pain. Patient not taking: Reported on 06/02/2018 04/04/18   Lanae Boast, FNP  pantoprazole (PROTONIX) 40 MG tablet Take 1 tablet (40 mg total) by mouth daily at 6 (six) AM. Patient not taking: Reported on 06/02/2018 03/07/18 04/30/18  Lanae Boast, FNP  torsemide (DEMADEX) 100 MG tablet TAKE 1 TABLET BY MOUTH EVERY DAY Patient not taking: No sig reported 04/08/18   Kenza Munar, Shaune Pascal, MD    Past Medical History: Past Medical History:  Diagnosis Date  . Blood dyscrasia   . Blood transfusion    "I've had a bunch of them"  . CHF (congestive heart failure) ([place])    followed by hf clinic  . Chronic kidney disease (CKD), stage III (moderate) ([facility])   . Gout   . HLKTGYBW(389.3)    "probably weekly" (01/13/2014)  . Legally blind   . Shortness of breath dyspnea   . Sickle cell disease, type SS ([place])   . Swelling abdomen   . Swelling of extremity, left   . Swelling of extremity, right     Past Surgical History: Past Surgical History:  Procedure Laterality Date  . BASCILIC VEIN TRANSPOSITION Right 04/12/2014   Procedure: BASILIC  VEIN TRANSPOSITION;  Surgeon: Elam Dutch, MD;  Location: [facility];  Service: Vascular;  Laterality: Right;  . CARDIAC CATHETERIZATION  05/2011  . COLONOSCOPY WITH PROPOFOL N/A 09/24/2017   Procedure: COLONOSCOPY WITH PROPOFOL;  Surgeon: Jackquline Denmark, MD;  Location: [facility] ENDOSCOPY;  Service: Endoscopy;  Laterality: N/A;  . ESOPHAGOGASTRODUODENOSCOPY N/A 09/19/2017   Procedure: ESOPHAGOGASTRODUODENOSCOPY (EGD);  Surgeon: Jackquline Denmark, MD;  Location: [facility] ENDOSCOPY;  Service: Endoscopy;  Laterality: N/A;  . LEFT AND RIGHT HEART CATHETERIZATION WITH CORONARY ANGIOGRAM N/A 06/11/2011   Procedure: LEFT AND RIGHT  HEART CATHETERIZATION WITH CORONARY ANGIOGRAM;  Surgeon: Larey Dresser, MD;  Location: [facility] CATH LAB;  Service: Cardiovascular;  Laterality: N/A;  . TEE WITHOUT CARDIOVERSION N/A 12/27/2015   Procedure: TRANSESOPHAGEAL ECHOCARDIOGRAM (TEE);  Surgeon: Sueanne Margarita, MD;  Location: [facility] ENDOSCOPY;  Service: Cardiovascular;  Laterality: N/A;    Family History: Family History  Problem Relation Age of Onset  . Alcohol abuse Mother   . Liver disease Mother   . Cirrhosis Mother   . Heart attack Mother     Social History: Social History   Socioeconomic History  . Marital status: Divorced    Spouse name: Not on file  . Number of children: Not on file  . Years of education: Not on file  . Highest education level: Not on file  Occupational History  . Not on file  Social Needs  . Financial resource strain: Not on file  . Food insecurity:    Worry: Not on file    Inability: Not on file  . Transportation needs:    Medical: Not on file    Non-medical: Not on file  Tobacco Use  . Smoking status: Former Smoker    Packs/day: 0.25    Years: 41.00    Pack years: 10.25    Types: Cigarettes    Last attempt to quit: 12/08/2015    Years since quitting: 2.4  . Smokeless tobacco: Never Used  Substance and Sexual Activity  . Alcohol use: Yes    Alcohol/week: 3.0 standard drinks    Types: 1  Glasses of wine, 2 Cans of beer per week    Comment: occasional  . Drug use: No  . Sexual activity: Not Currently  Lifestyle  . Physical activity:    Days per week: Not on file    Minutes per session: Not on file  . Stress: Not on file  Relationships  . Social connections:    Talks on phone: Not on file    Gets together: Not on file    Attends religious service: Not on file    Active member of club or organization: Not on file    Attends meetings of clubs or organizations: Not on file    Relationship status: Not on file  Other Topics Concern  . Not on file  Social History Narrative  . Not on file    Allergies:  No Known Allergies  Objective:    Vital Signs:   Temp:  [97.4 F (36.3 C)-98.1 F (36.7 C)] 97.4 F (36.3 C) (12/16 1345) Pulse Rate:  [67-102] 82 (12/16 1400) Resp:  [12-27] 13 (12/16 1400) BP: (107-156)/(73-94) 119/94 (12/16 1400) SpO2:  [94 %-99 %] 99 % (12/16 1400) Weight:  [68.9 kg] 68.9 kg (12/16 0927)    Weight change: Filed Weights   06/02/18 0927  Weight: 68.9 kg    Intake/Output:   Intake/Output Summary (Last 24 hours) at 06/02/2018 1438 Last data filed at 06/02/2018 1235 Gross per 24 hour  Intake -  Output 675 ml  Net -675 ml      Physical Exam    General:  No resp difficulty HEENT: normal Neck: supple. JVP to jaw . Carotids 2+ bilat; no bruits. No lymphadenopathy or thyromegaly appreciated. Cor: PMI nondisplaced. Irregular rate & rhythm. No rubs, gallops or murmurs. Lungs: clear Abdomen: soft, nontender, distended. No hepatosplenomegaly. No bruits or masses. Good bowel sounds. Extremities: no cyanosis, clubbing, rash, edema. RUE AVF  Neuro: alert & orientedx3, cranial nerves grossly intact. moves all 4 extremities w/o  difficulty. Affect pleasant   Telemetry   A fib 80-90s personally reviewed.   EKG    A fib 75 bpm   Labs   Basic Metabolic Panel: Recent Labs  Lab 06/02/18 0934  NA 136  K 4.2  CL 98  CO2 26    GLUCOSE 97  BUN 91*  CREATININE 5.91*  CALCIUM 8.7*    Liver Function Tests: Recent Labs  Lab 06/02/18 0934  AST 48*  ALT 37  ALKPHOS 63  BILITOT 1.5*  PROT 6.4*  ALBUMIN 3.4*   No results for input(s): LIPASE, AMYLASE in the last 168 hours. No results for input(s): AMMONIA in the last 168 hours.  CBC: Recent Labs  Lab 06/02/18 0934  WBC 7.3  NEUTROABS 5.1  HGB 6.4*  HCT 18.6*  MCV 104.5*  PLT 128*    Cardiac Enzymes: No results for input(s): CKTOTAL, CKMB, CKMBINDEX, TROPONINI in the last 168 hours.  BNP: BNP (last 3 results) Recent Labs    06/02/18 0934  BNP 1,644.3*    ProBNP (last 3 results) No results for input(s): PROBNP in the last 8760 hours.   CBG: No results for input(s): GLUCAP in the last 168 hours.  Coagulation Studies: No results for input(s): LABPROT, INR in the last 72 hours.   Imaging   Dg Chest 2 View  Result Date: 06/02/2018 CLINICAL DATA:  Shortness of breath and intermittent generalized chest chest pain which has worsened progressively over the past week. History of CHF, sickle cell disease, hypertension, chronic renal insufficiency, former smoker. EXAM: CHEST - 2 VIEW COMPARISON:  Portable chest x-ray of September 18, 2017 and PA and lateral chest x-ray of December 24, 2015 FINDINGS: The lungs are mildly hyperinflated. The interstitial markings are increased bilaterally in this reflects a change from the previous study. There is no discrete infiltrate. There is no pleural effusion or pneumothorax. The cardiac silhouette is top-normal in size. The pulmonary vascularity is prominent centrally. There is calcification in the wall of the aortic arch. The bony thorax exhibits no acute abnormality. IMPRESSION: Mild interstitial edema likely secondary to CHF. No alveolar pneumonia nor pleural effusion. Underlying chronic bronchitic-smoking related changes, stable. Thoracic aortic atherosclerosis. Electronically Signed   By: David  Martinique M.D.   On:  06/02/2018 09:53      Medications:     Current Medications:    Infusions:      Patient Profile   Patrick [name] is a legally blind 62 year old with a history of sickle cell anemia, gout, chronic systolic heart failure, NICM, CKD Stage IV (has RUE AVF) , PAF, GI bleed (coumadin stopped and eliquis started), anemia, and tobacco abuse.  Admitted with symptomatic anemia, a fib, and a/c systolic hf.     Assessment/Plan   1. Symptomatic Anemia, sickle cell anemia    Had  GI bleeding 09/2017. Had EGD/Colonoscpopy that showed gastritis and internal hemorrhoids. Coumadin was stopped and eliquis was started He has been receiving aranesp every other week.   No overt signs of bleeding. Hgb down 6.3. Receiving blood.  Continue ppi.   2. A/C Systolic Heart Failure, NICM   ECHO 07/2017 EF 40-45%. Plan to repeat ECHO  Admitted with shortness of breath at rest but this is complicated by anemia and suspect A fib has also worsened HF.  -BNP elevated and CXR with edema. He has received IV lasix. Will  Increase lasix to 80 mg tiwce a day Carvedilol and hydralazine on hold. Will need to start lower dose carvedilol.  3. A fib  First went in A fib in 22017 and spontaneously converted.  In April of this year he was in NSR.  He was taken off coumadin in April of this year due to GI bleed and started on eliquis.  Last dose of eliquis was this morning. Would continue eliqius 5 mg twice a day per Dr Haroldine Laws.  Rate controlled. If he doesn't convert will need cardioversion.  IF EF down will need to try and restore NSR.   4. HTN  Stable.   5. CKD Stage IV Followed by Lorrene Reid. Has AVF  But has not started hemodialysis.  Creatinine baseline 5.5.-5.8  Todays creatinine is 5.9.   6. Legally Blind   Medication concerns reviewed with patient and pharmacy team. Barriers identified: none   Length of Stay: 0  Patrick Grinder, Patrick [name]  06/02/2018, 2:38 PM  Advanced Heart Failure Team Pager [phone] (M-F; 7a  - 4p)  Please contact [facility] for night-coverage after hours (4p -7a ) and weekends on [email or web address]  Patient seen and examined with Patrick Grinder, Patrick [name]. We discussed all aspects of the encounter. I agree with the assessment and plan as stated above.   62 y/o male with sickle-cell anemia, CKD 4 (baseline creatinine 2.4-0.9), PAF and systolic HF with EF 73-53% presents with 1 week h/o progressive fatigue and HF symptoms. Found to be back in AF (duration unclear) and also to be anemic with Hgb  8.4-> 6.3. Denies evidence of acute bleeding. Has been on Eliquis for PAF. EGD 4/19 with gastritis and internal hemorrhoids.   On exam is in AF with volume overload. Suspect HF symptoms exacerbated by AF and anemia.  Would recommend: 1) transfuse 1u RBCs 2) diurese with IV lasix 3) Continue Eliquis as long as he is not actively bleeding so we can DC-CV if needed 4) Repeat echo 5) Consider DC-CV if he remains in AF (last time he had AF he spontaneously converted). Will not start anti-arrhythmic agent at this point.   Glori Bickers, MD  7:37 PM

## 2018-06-02 NOTE — ED Provider Notes (Signed)
[facility] EMERGENCY DEPARTMENT Provider Note   CSN: [id number] Arrival date & time: 06/02/18  3491     History   Chief Complaint Chief Complaint  Patient presents with  . Shortness of Breath    HPI Patrick [name] is a 63 y.o. male.  Patient is a 62 year old male with a history of sickle cell disease, chronic kidney disease, CHF and atrial fibrillation on Eliquis who presents with shortness of breath.  He reports a one-week history of some worsening shortness of breath.  He feels like he is a little fluid overloaded.  He denies any leg swelling but has had some increase in his neck pain and swelling.  Is also had about a 5 pound weight gain.  He has some intermittent chest pain which lasts about 1 to 2 minutes and goes across his chest.  His last episode was this morning just prior to arrival.  No fevers.  No coughing.  No pleuritic type pain.     Past Medical History:  Diagnosis Date  . Blood dyscrasia   . Blood transfusion    "I've had a bunch of them"  . CHF (congestive heart failure) ([place])    followed by hf clinic  . Chronic kidney disease (CKD), stage III (moderate) (HCC)   . Gout   . PHXTAVWP(794.8)    "probably weekly" (01/13/2014)  . Legally blind   . Shortness of breath dyspnea   . Sickle cell disease, type SS ([place])   . Swelling abdomen   . Swelling of extremity, left   . Swelling of extremity, right     Patient Active Problem List   Diagnosis Date Noted  . Acute GI bleeding 09/19/2017  . Symptomatic anemia   . Chronic heart failure with preserved ejection fraction ([place])   . Colon cancer screening 07/16/2017  . Encounter for therapeutic drug monitoring 12/29/2015  . 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  . Atrial fibrillation ([place]) 12/24/2015  . Cough with sputum 10/25/2015  . Muscle tightness-Right arm  05/27/2014  . Arm pain, right 05/06/2014  . End stage renal disease ([place]) 05/06/2014  . Chest pain 01/13/2014  . Vitamin D deficiency  09/25/2013  . CKD (chronic kidney disease), stage IV ([place]) 09/25/2013  . Elevated uric acid in blood 09/25/2013  . Hb-SS disease without crisis ([place]) 09/25/2013  . Anemia 09/25/2013  . Onychomycosis of toenail 08/27/2013  . Cellulitis 08/27/2013  . Hyperglycemia 08/27/2013  . CHF (congestive heart failure) ([place]) 08/19/2013  . Sickle cell anemia ([place]) 11/21/2012  . Abdominal pain 01/17/2012  . Tobacco abuse 10/29/2011  . NSVT (nonsustained ventricular tachycardia) ([place]) 10/21/2011  . Gout 10/19/2011  . Sickle cell crisis ([place]) 10/17/2011  . HTN (hypertension) 06/07/2011    Past Surgical History:  Procedure Laterality Date  . BASCILIC VEIN TRANSPOSITION Right 04/12/2014   Procedure: BASILIC VEIN TRANSPOSITION;  Surgeon: Elam Dutch, MD;  Location: [facility];  Service: Vascular;  Laterality: Right;  . CARDIAC CATHETERIZATION  05/2011  . COLONOSCOPY WITH PROPOFOL N/A 09/24/2017   Procedure: COLONOSCOPY WITH PROPOFOL;  Surgeon: Jackquline Denmark, MD;  Location: [facility] ENDOSCOPY;  Service: Endoscopy;  Laterality: N/A;  . ESOPHAGOGASTRODUODENOSCOPY N/A 09/19/2017   Procedure: ESOPHAGOGASTRODUODENOSCOPY (EGD);  Surgeon: Jackquline Denmark, MD;  Location[facility] ENDOSCOPY;  Service: Endoscopy;  Laterality: N/A;  . LEFT AND RIGHT HEART CATHETERIZATION WITH CORONARY ANGIOGRAM N/A 06/11/2011   Procedure: LEFT AND RIGHT HEART CATHETERIZATION WITH CORONARY ANGIOGRAM;  Surgeon: Larey Dresser, MD;  Location: [facility] CATH LAB;  Service: Cardiovascular;  Laterality: N/A;  . TEE WITHOUT CARDIOVERSION N/A 12/27/2015   Procedure: TRANSESOPHAGEAL ECHOCARDIOGRAM (TEE);  Surgeon: Sueanne Margarita, MD;  Location: [facility] ENDOSCOPY;  Service: Cardiovascular;  Laterality: N/A;        Home Medications    Prior to Admission medications   Medication Sig Start Date End Date Taking? Authorizing Provider  allopurinol (ZYLOPRIM) 100 MG tablet Take 100 mg by mouth 2 (two) times daily.   Yes [provider]  aspirin EC 81 MG tablet  Take 81 mg by mouth daily.   Yes [provider]  calcitRIOL (ROCALTROL) 0.5 MCG capsule Take 0.5 mcg by mouth daily. 04/15/18  Yes [provider]  carvedilol (COREG) 12.5 MG tablet Take 1 tablet (12.5 mg total) by mouth 2 (two) times daily with a meal. 10/10/17  Yes Bensimhon, Shaune Pascal, MD  ELIQUIS 5 MG TABS tablet TAKE 1 TABLET BY MOUTH TWICE A DAY Patient taking differently: Take 5 mg by mouth 2 (two) times daily.  05/14/18  Yes Bensimhon, Shaune Pascal, MD  folic acid (FOLVITE) 1 MG tablet TAKE 1 TABLET (1 MG TOTAL) BY MOUTH DAILY. Patient taking differently: Take 1 mg by mouth daily.  10/10/17  Yes Dorena Dew, FNP  hydrALAZINE (APRESOLINE) 25 MG tablet Take 1 tablet (25 mg total) by mouth 2 (two) times daily. 10/10/17 06/02/18 Yes Bensimhon, Shaune Pascal, MD  hydroxyurea (HYDREA) 500 MG capsule TAKE ONE CAPSULE EVERY DAY WITH FOOD TO MINIMIZE GI SIDE EFFECTS Patient taking differently: Take 500 mg by mouth daily. TAKE WITH FOOD TO MINIMIZE GI SIDE EFFECTS 01/20/18  Yes Jegede, Olugbemiga E, MD  isosorbide mononitrate (IMDUR) 30 MG 24 hr tablet TAKE 1 TABLET BY MOUTH EVERY DAY Patient taking differently: Take 30 mg by mouth daily.  05/05/18  Yes Bensimhon, Shaune Pascal, MD  torsemide (DEMADEX) 100 MG tablet Take 1 tablet (100 mg total) by mouth daily. 01/20/18 06/02/18 Yes Jegede, Marlena Clipper, MD  oxyCODONE (OXY IR/ROXICODONE) 5 MG immediate release tablet Take 1 tablet (5 mg total) by mouth every 6 (six) hours as needed for severe pain. Patient not taking: Reported on 06/02/2018 04/04/18   Lanae Boast, FNP  pantoprazole (PROTONIX) 40 MG tablet Take 1 tablet (40 mg total) by mouth daily at 6 (six) AM. Patient not taking: Reported on 06/02/2018 03/07/18 04/30/18  Lanae Boast, FNP  torsemide (DEMADEX) 100 MG tablet TAKE 1 TABLET BY MOUTH EVERY DAY Patient not taking: No sig reported 04/08/18   Bensimhon, Shaune Pascal, MD    Family History Family History  Problem Relation Age of Onset   . Alcohol abuse Mother   . Liver disease Mother   . Cirrhosis Mother   . Heart attack Mother     Social History Social History   Tobacco Use  . Smoking status: Former Smoker    Packs/day: 0.25    Years: 41.00    Pack years: 10.25    Types: Cigarettes    Last attempt to quit: 12/08/2015    Years since quitting: 2.4  . Smokeless tobacco: Never Used  Substance Use Topics  . Alcohol use: Yes    Alcohol/week: 3.0 standard drinks    Types: 1 Glasses of wine, 2 Cans of beer per week    Comment: occasional  . Drug use: No     Allergies   Patient has no known allergies.   Review of Systems Review of Systems  Constitutional: Negative for chills, diaphoresis, fatigue and fever.  HENT: Negative for  congestion, rhinorrhea and sneezing.   Eyes: Negative.   Respiratory: Positive for shortness of breath. Negative for cough and chest tightness.   Cardiovascular: Positive for chest pain. Negative for leg swelling.  Gastrointestinal: Negative for abdominal pain, blood in stool, diarrhea, nausea and vomiting.  Genitourinary: Negative for difficulty urinating, flank pain, frequency and hematuria.  Musculoskeletal: Negative for arthralgias and back pain.  Skin: Negative for rash.  Neurological: Negative for dizziness, speech difficulty, weakness, numbness and headaches.     Physical Exam Updated Vital Signs BP 129/80   Pulse 67   Resp 13   Ht 5\' 11"  (1.803 m)   Wt 68.9 kg   SpO2 97%   BMI 21.20 kg/m   Physical Exam Constitutional:      Appearance: He is well-developed.  HENT:     Head: Normocephalic and atraumatic.  Eyes:     Pupils: Pupils are equal, round, and reactive to light.  Neck:     Musculoskeletal: Normal range of motion and neck supple.  Cardiovascular:     Rate and Rhythm: Normal rate and regular rhythm.     Heart sounds: Normal heart sounds.  Pulmonary:     Effort: Pulmonary effort is normal. No respiratory distress.     Breath sounds: Decreased breath  sounds (Diminished breath sounds in the bases) present. No wheezing or rales.  Chest:     Chest wall: No tenderness.  Abdominal:     General: Bowel sounds are normal.     Palpations: Abdomen is soft.     Tenderness: There is no abdominal tenderness. There is no guarding or rebound.  Musculoskeletal: Normal range of motion.     Right lower leg: He exhibits no tenderness. No edema.     Left lower leg: He exhibits no tenderness. No edema.  Lymphadenopathy:     Cervical: No cervical adenopathy.  Skin:    General: Skin is warm and dry.     Findings: No rash.  Neurological:     Mental Status: He is alert and oriented to person, place, and time.      ED Treatments / Results  Labs (all labs ordered are listed, but only abnormal results are displayed) Labs Reviewed  COMPREHENSIVE METABOLIC PANEL - Abnormal; Notable for the following components:      Result Value   BUN 91 (*)    Creatinine, Ser 5.91 (*)    Calcium 8.7 (*)    Total Protein 6.4 (*)    Albumin 3.4 (*)    AST 48 (*)    Total Bilirubin 1.5 (*)    GFR calc non Af Amer 10 (*)    GFR calc Af Amer 11 (*)    All other components within normal limits  CBC WITH DIFFERENTIAL/PLATELET - Abnormal; Notable for the following components:   RBC 1.78 (*)    Hemoglobin 6.4 (*)    HCT 18.6 (*)    MCV 104.5 (*)    MCH 36.0 (*)    RDW 20.8 (*)    Platelets 128 (*)    nRBC 12.4 (*)    Lymphs Abs 0.6 (*)    Monocytes Absolute 1.2 (*)    Abs Immature Granulocytes 0.12 (*)    All other components within normal limits  BRAIN NATRIURETIC PEPTIDE  I-STAT TROPONIN, ED  PREPARE RBC (CROSSMATCH)  TYPE AND SCREEN    EKG EKG Interpretation  Date/Time:  Monday June 02 2018 09:25:54 EST Ventricular Rate:  75 PR Interval:    QRS Duration:  99 QT Interval:  417 QTC Calculation: 466 R Axis:   78 Text Interpretation:  Atrial fibrillation Nonspecific T abnormalities, lateral leads since last tracing no significant change Confirmed by  Malvin Johns [phone]) on 06/02/2018 9:33:25 AM   Radiology Dg Chest 2 View  Result Date: 06/02/2018 CLINICAL DATA:  Shortness of breath and intermittent generalized chest chest pain which has worsened progressively over the past week. History of CHF, sickle cell disease, hypertension, chronic renal insufficiency, former smoker. EXAM: CHEST - 2 VIEW COMPARISON:  Portable chest x-ray of September 18, 2017 and PA and lateral chest x-ray of December 24, 2015 FINDINGS: The lungs are mildly hyperinflated. The interstitial markings are increased bilaterally in this reflects a change from the previous study. There is no discrete infiltrate. There is no pleural effusion or pneumothorax. The cardiac silhouette is top-normal in size. The pulmonary vascularity is prominent centrally. There is calcification in the wall of the aortic arch. The bony thorax exhibits no acute abnormality. IMPRESSION: Mild interstitial edema likely secondary to CHF. No alveolar pneumonia nor pleural effusion. Underlying chronic bronchitic-smoking related changes, stable. Thoracic aortic atherosclerosis. Electronically Signed   By: David  Martinique M.D.   On: 06/02/2018 09:53    Procedures Procedures (including critical care time)  Medications Ordered in ED Medications  furosemide (LASIX) injection 80 mg (has no administration in time range)  0.9 %  sodium chloride infusion (Manually program via Guardrails IV Fluids) (has no administration in time range)     Initial Impression / Assessment and Plan / ED Course  I have reviewed the triage vital signs and the nursing notes.  Pertinent labs & imaging results that were available during my care of the patient were reviewed by me and considered in my medical decision making (see chart for details).     Patient is a 62 year old male who presents for shortness of breath.  No associated chest pain currently now that he has had some earlier today.  He has some suggestions of fluid overload with  some edema on chest x-ray.  His BNP is still pending.  He does have a low hemoglobin at 6.4.  His baseline hemoglobin runs around 7-8 but he says his threshold for transfusion is when it drops below 7.  He was typed and crossed for 2 units of packed red cells.  His troponin is negative.  There is no ischemic changes on EKG.  I have spoken with [place] Hollis, NP with the sickle cell service he will admit the patient and transfer him to [facility] for further treatment.  Final Clinical Impressions(s) / ED Diagnoses   Final diagnoses:  Acute on chronic congestive heart failure, unspecified heart failure type (HCC)  Iron deficiency anemia, unspecified iron deficiency anemia type    ED Discharge Orders    None       Malvin Johns, MD 06/02/18 1156

## 2018-06-02 NOTE — ED Notes (Signed)
Critical hemoglobin 6.4. Dr. Tamera Punt made aware. New orders to transfuse RBCs.

## 2018-06-02 NOTE — Plan of Care (Signed)
  Problem: Pain Managment: Goal: General experience of comfort will improve Outcome: Progressing   Problem: Safety: Goal: Ability to remain free from injury will improve Outcome: Progressing   Problem: Activity: Goal: Capacity to carry out activities will improve Outcome: Progressing

## 2018-06-02 NOTE — ED Notes (Signed)
ED Provider at bedside. 

## 2018-06-02 NOTE — ED Triage Notes (Signed)
Pt here c/o shortness of breath and intermittent generalized chest pain that has gotten progressively worse over the last week. Hx CHF and a-fib. Reports 5lb weight gain over last week and increased swelling in bilateral lower extremities. Denies recent fevers/cough. VSS at this time.

## 2018-06-02 NOTE — ED Notes (Signed)
Per Sickle Cell NP Patrick Hollis, do not give 2nd unit of blood

## 2018-06-02 NOTE — ED Notes (Signed)
Help get patient undress on the monitor patient is resting with call bell in reach

## 2018-06-02 NOTE — ED Notes (Signed)
Sickle Cell Doctor at bedside will order patient to receive only one unit of blood.

## 2018-06-02 NOTE — ED Notes (Signed)
Cards paged to [facility] to [phone]

## 2018-06-02 NOTE — H&P (Addendum)
H&P  Patient Demographics:  Patrick [name], is a 62 y.o. male  MRN: [id number]   DOB - October 01, 1955  Admit Date - 06/02/2018  Outpatient Primary MD for the patient is [name], MD  Chief Complaint  Patient presents with  . Shortness of Breath      HPI:   Patrick [name]  is a 62 y.o. male medical history significant for CHF, nonischemic cardiomyopathy, sickle cell anemia, end-stage chronic kidney disease, and tobacco abuse who was admitted today with symptomatic anemia and worsening dyspnea patient is followed by CHF clinic.  It appears the patient has a long history of LV systolic dysfunction.  Patient is well-known to me from previous care as an opiate nave patient with a history of sickle cell anemia, hemoglobin [place].  Patient presented to the emergency department with difficulty breathing over the past 3 to 4 days, which is progressively worsened.  Patient also noticed that abdomen was "bloated" and he was becoming dizzy when attempting to tie shoes. Patient also endorses increased shortness of breath with exertion and fatigue.  He denies fever, chills, nausea, vomiting, diarrhea, or constipation. .   ER Course:  Oxygen saturation 88% on RA, hemoglobin 6.4, which is below baseline of 7.0-8.0, platelets 128. Creatinine is 5.91.  BNP 1644.3. Chest xray shows,  Mild interstitial edema likely secondary to CHF. No alveolar pneumonia nor pleural effusion.  was  patient has not been admitted over the past year   Review of systems:  Review of Systems  Constitutional: Positive for diaphoresis and malaise/fatigue.  Eyes: Negative.   Respiratory: Positive for shortness of breath. Negative for cough and wheezing.   Cardiovascular: Positive for chest pain.  Gastrointestinal: Negative.  Negative for diarrhea, heartburn and nausea.       Abdominal bloating  Genitourinary: Negative.  Negative for dysuria and hematuria.  Musculoskeletal: Negative for myalgias and neck pain.   Neurological: Positive for dizziness.  Endo/Heme/Allergies: Negative.   Psychiatric/Behavioral: Negative.  Negative for depression and suicidal ideas.    A full 10 point Review of Systems was done, except as stated above, all other Review of Systems were negative.  With Past History of the following :   Past Medical History:  Diagnosis Date  . Blood dyscrasia   . Blood transfusion    "I've had a bunch of them"  . CHF (congestive heart failure) ([place])    followed by hf clinic  . Chronic kidney disease (CKD), stage III (moderate) (H[facility])   . Gout   . NWGNFAOZ(308.6)    "probably weekly" (01/13/2014)  . Legally blind   . Shortness of breath dyspnea   . Sickle cell disease, type SS ([place])   . Swelling abdomen   . Swelling of extremity, left   . Swelling of extremity, right       Past Surgical History:  Procedure Laterality Date  . BASCILIC VEIN TRANSPOSITION Right 04/12/2014   Procedure: BASILIC VEIN TRANSPOSITION;  Surgeon: Elam Dutch, MD;  Location: [place];  Service: Vascular;  Laterality: Right;  . CARDIAC CATHETERIZATION  05/2011  . COLONOSCOPY WITH PROPOFOL N/A 09/24/2017   Procedure: COLONOSCOPY WITH PROPOFOL;  Surgeon: Jackquline Denmark, MD;  Location[facility] ENDOSCOPY;  Service: Endoscopy;  Laterality: N/A;  . ESOPHAGOGASTRODUODENOSCOPY N/A 09/19/2017   Procedure: ESOPHAGOGASTRODUODENOSCOPY (EGD);  Surgeon: Jackquline Denmark, MD;  Location: [facility] ENDOSCOPY;  Service: Endoscopy;  Laterality: N/A;  . LEFT AND RIGHT HEART CATHETERIZATION WITH CORONARY ANGIOGRAM N/A 06/11/2011   Procedure: LEFT AND RIGHT HEART CATHETERIZATION WITH  CORONARY ANGIOGRAM;  Surgeon: Larey Dresser, MD;  Location: [facility] CATH LAB;  Service: Cardiovascular;  Laterality: N/A;  . TEE WITHOUT CARDIOVERSION N/A 12/27/2015   Procedure: TRANSESOPHAGEAL ECHOCARDIOGRAM (TEE);  Surgeon: Sueanne Margarita, MD;  Location: [facility] ENDOSCOPY;  Service: Cardiovascular;  Laterality: N/A;     Social History:   Social History   Tobacco Use   . Smoking status: Former Smoker    Packs/day: 0.25    Years: 41.00    Pack years: 10.25    Types: Cigarettes    Last attempt to quit: 12/08/2015    Years since quitting: 2.4  . Smokeless tobacco: Never Used  Substance Use Topics  . Alcohol use: Yes    Alcohol/week: 3.0 standard drinks    Types: 1 Glasses of wine, 2 Cans of beer per week    Comment: occasional     Lives - At home   Family History :   Family History  Problem Relation Age of Onset  . Alcohol abuse Mother   . Liver disease Mother   . Cirrhosis Mother   . Heart attack Mother      Home Medications:   Prior to Admission medications   Medication Sig Start Date End Date Taking? Authorizing Provider  allopurinol (ZYLOPRIM) 100 MG tablet Take 100 mg by mouth 2 (two) times daily.   Yes [provider]  aspirin EC 81 MG tablet Take 81 mg by mouth daily.   Yes [provider]  calcitRIOL (ROCALTROL) 0.5 MCG capsule Take 0.5 mcg by mouth daily. 04/15/18  Yes [provider]  carvedilol (COREG) 12.5 MG tablet Take 1 tablet (12.5 mg total) by mouth 2 (two) times daily with a meal. 10/10/17  Yes Bensimhon, Shaune Pascal, MD  ELIQUIS 5 MG TABS tablet TAKE 1 TABLET BY MOUTH TWICE A DAY Patient taking differently: Take 5 mg by mouth 2 (two) times daily.  05/14/18  Yes Bensimhon, Shaune Pascal, MD  folic acid (FOLVITE) 1 MG tablet TAKE 1 TABLET (1 MG TOTAL) BY MOUTH DAILY. Patient taking differently: Take 1 mg by mouth daily.  10/10/17  Yes Dorena Dew, FNP  hydrALAZINE (APRESOLINE) 25 MG tablet Take 1 tablet (25 mg total) by mouth 2 (two) times daily. 10/10/17 06/02/18 Yes Bensimhon, Shaune Pascal, MD  hydroxyurea (HYDREA) 500 MG capsule TAKE ONE CAPSULE EVERY DAY WITH FOOD TO MINIMIZE GI SIDE EFFECTS Patient taking differently: Take 500 mg by mouth daily. TAKE WITH FOOD TO MINIMIZE GI SIDE EFFECTS 01/20/18  Yes Jegede, Olugbemiga E, MD  isosorbide mononitrate (IMDUR) 30 MG 24 hr tablet TAKE 1 TABLET BY MOUTH EVERY  DAY Patient taking differently: Take 30 mg by mouth daily.  05/05/18  Yes Bensimhon, Shaune Pascal, MD  torsemide (DEMADEX) 100 MG tablet Take 1 tablet (100 mg total) by mouth daily. 01/20/18 06/02/18 Yes Jegede, Marlena Clipper, MD  oxyCODONE (OXY IR/ROXICODONE) 5 MG immediate release tablet Take 1 tablet (5 mg total) by mouth every 6 (six) hours as needed for severe pain. Patient not taking: Reported on 06/02/2018 04/04/18   Lanae Boast, FNP  pantoprazole (PROTONIX) 40 MG tablet Take 1 tablet (40 mg total) by mouth daily at 6 (six) AM. Patient not taking: Reported on 06/02/2018 03/07/18 04/30/18  Lanae Boast, FNP  torsemide (DEMADEX) 100 MG tablet TAKE 1 TABLET BY MOUTH EVERY DAY Patient not taking: No sig reported 04/08/18   Bensimhon, Shaune Pascal, MD     Allergies:   No Known Allergies   Physical Exam:  Vitals:   Vitals:   06/02/18 1235 06/02/18 1245  BP: 126/82 125/89  Pulse: 86 (!) 102  Resp: 18 16  Temp: 98.1 F (36.7 C)   SpO2: 97% 98%   Physical Exam Constitutional:      Appearance: He is well-developed. He is not ill-appearing.  HENT:     Head: Normocephalic.  Cardiovascular:     Rate and Rhythm: Normal rate. Rhythm regularly irregular.     Pulses: Normal pulses.  Pulmonary:     Effort: No respiratory distress.     Breath sounds: Examination of the right-lower field reveals decreased breath sounds. Examination of the left-lower field reveals decreased breath sounds. Decreased breath sounds present.  Abdominal:     General: There is distension.  Musculoskeletal: Normal range of motion.  Skin:    General: Skin is warm and dry.  Neurological:     General: No focal deficit present.     Mental Status: He is alert. Mental status is at baseline.     Motor: No weakness.     Gait: Gait normal.  Psychiatric:        Mood and Affect: Mood normal.        Thought Content: Thought content normal.        Judgment: Judgment normal.       Data Review:   CBC Recent Labs   Lab 06/02/18 0934  WBC 7.3  HGB 6.4*  HCT 18.6*  PLT 128*  MCV 104.5*  MCH 36.0*  MCHC 34.4  RDW 20.8*  LYMPHSABS 0.6*  MONOABS 1.2*  EOSABS 0.2  BASOSABS 0.1   ------------------------------------------------------------------------------------------------------------------  Chemistries  Recent Labs  Lab 06/02/18 0934  NA 136  K 4.2  CL 98  CO2 26  GLUCOSE 97  BUN 91*  CREATININE 5.91*  CALCIUM 8.7*  AST 48*  ALT 37  ALKPHOS 63  BILITOT 1.5*   ------------------------------------------------------------------------------------------------------------------ estimated creatinine clearance is 13 mL/min (A) (by C-G formula based on SCr of 5.91 mg/dL (H)). ------------------------------------------------------------------------------------------------------------------ No results for input(s): TSH, T4TOTAL, T3FREE, THYROIDAB in the last 72 hours.  Invalid input(s): FREET3  Coagulation profile No results for input(s): INR, PROTIME in the last 168 hours. ------------------------------------------------------------------------------------------------------------------- No results for input(s): DDIMER in the last 72 hours. -------------------------------------------------------------------------------------------------------------------  Cardiac Enzymes No results for input(s): CKMB, TROPONINI, MYOGLOBIN in the last 168 hours.  Invalid input(s): CK ------------------------------------------------------------------------------------------------------------------    Component Value Date/Time   BNP 1,644.3 (H) 06/02/2018 0934    ---------------------------------------------------------------------------------------------------------------  Urinalysis    Component Value Date/Time   COLORURINE STRAW (A) 09/18/2017 2200   APPEARANCEUR CLEAR 09/18/2017 2200   APPEARANCEUR Clear 05/31/2017 0902   LABSPEC 1.010 09/18/2017 2200   PHURINE 5.0 09/18/2017 2200    GLUCOSEU NEGATIVE 09/18/2017 2200   HGBUR SMALL (A) 09/18/2017 2200   BILIRUBINUR neg 01/27/2018 1537   BILIRUBINUR Negative 05/31/2017 0902   KETONESUR NEGATIVE 09/18/2017 2200   PROTEINUR Positive (A) 01/27/2018 1537   PROTEINUR 100 (A) 09/18/2017 2200   UROBILINOGEN 0.2 01/27/2018 1537   UROBILINOGEN 0.2 09/14/2013 1354   NITRITE neg 01/27/2018 1537   NITRITE NEGATIVE 09/18/2017 2200   LEUKOCYTESUR Negative 01/27/2018 1537   LEUKOCYTESUR Negative 05/31/2017 0902    ----------------------------------------------------------------------------------------------------------------   Imaging Results:    Dg Chest 2 View  Result Date: 06/02/2018 CLINICAL DATA:  Shortness of breath and intermittent generalized chest chest pain which has worsened progressively over the past week. History of CHF, sickle cell disease, hypertension, chronic renal insufficiency, former smoker. EXAM: CHEST - 2  VIEW COMPARISON:  Portable chest x-ray of September 18, 2017 and PA and lateral chest x-ray of December 24, 2015 FINDINGS: The lungs are mildly hyperinflated. The interstitial markings are increased bilaterally in this reflects a change from the previous study. There is no discrete infiltrate. There is no pleural effusion or pneumothorax. The cardiac silhouette is top-normal in size. The pulmonary vascularity is prominent centrally. There is calcification in the wall of the aortic arch. The bony thorax exhibits no acute abnormality. IMPRESSION: Mild interstitial edema likely secondary to CHF. No alveolar pneumonia nor pleural effusion. Underlying chronic bronchitic-smoking related changes, stable. Thoracic aortic atherosclerosis. Electronically Signed   By: David  Martinique M.D.   On: 06/02/2018 09:53      Assessment & Plan:  Active Problems:   HTN (hypertension)   Tobacco abuse   Sickle cell anemia (HCC)   CHF (congestive heart failure) (HCC)   Atrial fibrillation (HCC)   Symptomatic anemia   Hypoxia  Anemia of  chronic disease: Hemoglobin is 6.4, which is below baseline. Patient will received 1 unit of PRBCs. Will reevaluate oxygenation following blood transfusion. CBC in am   End Stage Chronic kidney disease:  Patient has a chronic progression of renal insufficiency. Patient is followed by nephrology outpatient, Dr. Lorrene Reid. Has not started hemodialysisWill consult nephrology. Will continue gentle diuresis with torsemide  Acute respiratory failure with hypoxia:  Patient has abdominal ascites that may be contributing to respiratory compromise. Will continue gentle diuresis and monitor respiratory condition for improvement following blood transfusion.   Pulmonary edema:  Patient has some evidence of fluid overload. 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 Will continue home meds  Acute on chronic CHF:  Patient's BNP is 1644. He has been under the care of Dr. Missy Sabins in the CHF clinic. Patient received IV furosemide in ER. Will consult cardiology  Hypertension:  Stable Continue home meds  History of atrial fibrillation: Continue  Eliquis   DVT Prophylaxis: Continue Eliquis  AM Labs Ordered, also please review Full Orders  Family Communication: Admission, patient's condition and plan of care including tests being ordered have been discussed with the patient who indicate understanding and agree with the plan and Code Status.  Code Status: Full Code  Consults called: None    Admission status: Inpatient    Time spent in minutes : 50 minutes  [place], MSN, FNP-C Patient [facility] [address] [place], [place] [address] [phone]  06/02/2018 at 1:15 PM

## 2018-06-03 ENCOUNTER — Inpatient Hospital Stay (HOSPITAL_COMMUNITY): Payer: PPO

## 2018-06-03 DIAGNOSIS — I340 Nonrheumatic mitral (valve) insufficiency: Secondary | ICD-10-CM

## 2018-06-03 DIAGNOSIS — D509 Iron deficiency anemia, unspecified: Secondary | ICD-10-CM

## 2018-06-03 DIAGNOSIS — I361 Nonrheumatic tricuspid (valve) insufficiency: Secondary | ICD-10-CM

## 2018-06-03 LAB — CBC WITH DIFFERENTIAL/PLATELET
Abs Immature Granulocytes: 0 10*3/uL (ref 0.00–0.07)
Basophils Absolute: 0 10*3/uL (ref 0.0–0.1)
Basophils Relative: 0 %
Eosinophils Absolute: 0.5 10*3/uL (ref 0.0–0.5)
Eosinophils Relative: 7 %
HCT: 20.7 % — ABNORMAL LOW (ref 39.0–52.0)
Hemoglobin: 7.2 g/dL — ABNORMAL LOW (ref 13.0–17.0)
Lymphocytes Relative: 8 %
Lymphs Abs: 0.5 10*3/uL — ABNORMAL LOW (ref 0.7–4.0)
MCH: 35.1 pg — ABNORMAL HIGH (ref 26.0–34.0)
MCHC: 34.8 g/dL (ref 30.0–36.0)
MCV: 101 fL — ABNORMAL HIGH (ref 80.0–100.0)
Monocytes Absolute: 0.7 10*3/uL (ref 0.1–1.0)
Monocytes Relative: 10 %
Neutro Abs: 5 10*3/uL (ref 1.7–7.7)
Neutrophils Relative %: 75 %
Platelets: 123 10*3/uL — ABNORMAL LOW (ref 150–400)
RBC: 2.05 MIL/uL — ABNORMAL LOW (ref 4.22–5.81)
RDW: 22.2 % — ABNORMAL HIGH (ref 11.5–15.5)
WBC: 6.7 10*3/uL (ref 4.0–10.5)
nRBC: 11.7 % — ABNORMAL HIGH (ref 0.0–0.2)
nRBC: 22 /100 WBC — ABNORMAL HIGH

## 2018-06-03 LAB — ECHOCARDIOGRAM COMPLETE
Height: 71 in
Weight: 2288 oz

## 2018-06-03 LAB — COMPREHENSIVE METABOLIC PANEL
ALT: 30 U/L (ref 0–44)
AST: 33 U/L (ref 15–41)
Albumin: 3.1 g/dL — ABNORMAL LOW (ref 3.5–5.0)
Alkaline Phosphatase: 56 U/L (ref 38–126)
Anion gap: 13 (ref 5–15)
BUN: 89 mg/dL — ABNORMAL HIGH (ref 6–20)
CO2: 27 mmol/L (ref 22–32)
Calcium: 8.8 mg/dL — ABNORMAL LOW (ref 8.9–10.3)
Chloride: 100 mmol/L (ref 98–111)
Creatinine, Ser: 5.66 mg/dL — ABNORMAL HIGH (ref 0.61–1.24)
GFR calc Af Amer: 12 mL/min — ABNORMAL LOW (ref >60)
GFR calc non Af Amer: 10 mL/min — ABNORMAL LOW (ref >60)
Glucose, Bld: 88 mg/dL (ref 70–99)
Potassium: 3.6 mmol/L (ref 3.5–5.1)
Sodium: 140 mmol/L (ref 135–145)
Total Bilirubin: 1.7 mg/dL — ABNORMAL HIGH (ref 0.3–1.2)
Total Protein: 6.2 g/dL — ABNORMAL LOW (ref 6.5–8.1)

## 2018-06-03 LAB — PREPARE RBC (CROSSMATCH)

## 2018-06-03 LAB — PATHOLOGIST SMEAR REVIEW

## 2018-06-03 MED ORDER — CARVEDILOL 6.25 MG PO TABS
6.2500 mg | ORAL_TABLET | Freq: Two times a day (BID) | ORAL | Status: DC
  Administered 2018-06-03 – 2018-06-04 (×3): 6.25 mg via ORAL
  Filled 2018-06-03 (×2): qty 1

## 2018-06-03 MED ORDER — AMIODARONE HCL IN DEXTROSE 360-4.14 MG/200ML-% IV SOLN
60.0000 mg/h | INTRAVENOUS | Status: AC
  Administered 2018-06-03: 60 mg/h via INTRAVENOUS
  Filled 2018-06-03: qty 200

## 2018-06-03 MED ORDER — AMIODARONE HCL IN DEXTROSE 360-4.14 MG/200ML-% IV SOLN
30.0000 mg/h | INTRAVENOUS | Status: DC
  Administered 2018-06-03 – 2018-06-06 (×3): 30 mg/h via INTRAVENOUS
  Filled 2018-06-03 (×5): qty 200

## 2018-06-03 MED ORDER — DARBEPOETIN ALFA 200 MCG/0.4ML IJ SOSY
200.0000 ug | PREFILLED_SYRINGE | Freq: Once | INTRAMUSCULAR | Status: AC
  Administered 2018-06-04: 200 ug via SUBCUTANEOUS
  Filled 2018-06-03 (×2): qty 0.4

## 2018-06-03 MED ORDER — ACETAMINOPHEN 325 MG PO TABS
650.0000 mg | ORAL_TABLET | Freq: Once | ORAL | Status: AC
  Administered 2018-06-03: 650 mg via ORAL
  Filled 2018-06-03: qty 2

## 2018-06-03 MED ORDER — FUROSEMIDE 10 MG/ML IJ SOLN
40.0000 mg | Freq: Once | INTRAMUSCULAR | Status: AC
  Administered 2018-06-03: 40 mg via INTRAVENOUS

## 2018-06-03 MED ORDER — SODIUM CHLORIDE 0.9% IV SOLUTION: Freq: Once | INTRAVENOUS | Status: DC

## 2018-06-03 MED ORDER — DIPHENHYDRAMINE HCL 25 MG PO CAPS
25.0000 mg | ORAL_CAPSULE | Freq: Once | ORAL | Status: AC
  Administered 2018-06-03: 25 mg via ORAL
  Filled 2018-06-03: qty 1

## 2018-06-03 NOTE — Progress Notes (Signed)
  Amiodarone Drug - Drug Interaction Consult Note  Recommendations: -Monitor HR with concurrent carvedilol   Amiodarone is metabolized by the cytochrome P450 system and therefore has the potential to cause many drug interactions. Amiodarone has an average plasma half-life of 50 days (range 20 to 100 days).   There is potential for drug interactions to occur several weeks or months after stopping treatment and the onset of drug interactions may be slow after initiating amiodarone.   []  Statins: Increased risk of myopathy. Simvastatin- restrict dose to 20mg  daily. Other statins: counsel patients to report any muscle pain or weakness immediately.  []  Anticoagulants: Amiodarone can increase anticoagulant effect. Consider warfarin dose reduction. Patients should be monitored closely and the dose of anticoagulant altered accordingly, remembering that amiodarone levels take several weeks to stabilize.  []  Antiepileptics: Amiodarone can increase plasma concentration of phenytoin, the dose should be reduced. Note that small changes in phenytoin dose can result in large changes in levels. Monitor patient and counsel on signs of toxicity.  [x]  Beta blockers: increased risk of bradycardia, AV block and myocardial depression. Sotalol - avoid concomitant use.  []   Calcium channel blockers (diltiazem and verapamil): increased risk of bradycardia, AV block and myocardial depression.  []   Cyclosporine: Amiodarone increases levels of cyclosporine. Reduced dose of cyclosporine is recommended.  []  Digoxin dose should be halved when amiodarone is started.  []  Diuretics: increased risk of cardiotoxicity if hypokalemia occurs.  []  Oral hypoglycemic agents (glyburide, glipizide, glimepiride): increased risk of hypoglycemia. Patient's glucose levels should be monitored closely when initiating amiodarone therapy.   []  Drugs that prolong the QT interval:  Torsades de pointes risk may be increased with concurrent  use - avoid if possible.  Monitor QTc, also keep magnesium/potassium WNL if concurrent therapy can't be avoided. Marland Kitchen Antibiotics: e.g. fluoroquinolones, erythromycin. . Antiarrhythmics: e.g. quinidine, procainamide, disopyramide, sotalol. . Antipsychotics: e.g. phenothiazines, haloperidol.  . Lithium, tricyclic antidepressants, and methadone. Thank Concha Pyo  06/03/2018 10:12 AM

## 2018-06-03 NOTE — Plan of Care (Signed)
  Problem: Pain Managment: Goal: General experience of comfort will improve Outcome: Progressing   Problem: Safety: Goal: Ability to remain free from injury will improve Outcome: Progressing   Problem: Activity: Goal: Capacity to carry out activities will improve Outcome: Progressing

## 2018-06-03 NOTE — Progress Notes (Signed)
  Echocardiogram 2D Echocardiogram has been performed.  Patrick [name] Patrick [name] 06/03/2018, 10:55 AM

## 2018-06-03 NOTE — Progress Notes (Signed)
Subjective: Patrick [name], a 62 year old male with a medical history significant for CHF, nonischemic cardiomyopathy, sickle cell anemia, HbSC, end-stage kidney disease, legally blind and tobacco dependent was admitted on 06/02/2018 with symptomatic anemia.   Patient says that he feels better. Patient received 1 unit of PRBCs on 06/02/2018, hemoglobin has improved to 7.2. Patient says that he has been able to ambulate in room with minimal assistance. He also says that he was able to get a good night's sleep for the first time in 6 days. Patient denies dizziness, shortness of breath, chest pain, dysuria, nausea, vomiting, diarrhea or constipation.   Objective:  Vital signs in last 24 hours:  Vitals:   06/02/18 1955 06/03/18 0013 06/03/18 0241 06/03/18 0533  BP: 114/80 126/78  123/72  Pulse: (!) 53 64  84  Resp: 20 16  20   Temp: (!) 97.5 F (36.4 C) 98.6 F (37 C)  (!) 97.3 F (36.3 C)  TempSrc: Oral Oral  Oral  SpO2: 100% 100%  100%  Weight:   64.9 kg   Height:        Intake/Output from previous day:   Intake/Output Summary (Last 24 hours) at 06/03/2018 0928 Last data filed at 06/03/2018 0825 Gross per 24 hour  Intake 720 ml  Output 3425 ml  Net -2705 ml    Physical Exam: General: Alert, awake, oriented x3, in no acute distress.  HEENT: /AT PEERL, EOMI Neck: Trachea midline,  no masses, no thyromegal,y no JVD, no carotid bruit OROPHARYNX:  Moist, No exudate/ erythema/lesions.  Heart: Regular rate and rhythm, without murmurs, rubs, gallops, PMI non-displaced, no heaves or thrills on palpation.  Lungs: Clear to auscultation, no wheezing or rhonchi noted. No increased vocal fremitus resonant to percussion  Abdomen: Soft, nontender, mildly distended, positive bowel sounds, no masses no hepatosplenomegaly noted..  Neuro: No focal neurological deficits noted cranial nerves II through XII grossly intact. DTRs 2+ bilaterally upper and lower extremities. Strength 5 out of 5 in  bilateral upper and lower extremities. Musculoskeletal: No warm swelling or erythema around joints, no spinal tenderness noted. Psychiatric: Patient alert and oriented x3, good insight and cognition, good recent to remote recall. Lymph node survey: No cervical axillary or inguinal lymphadenopathy noted.  Lab Results:  Basic Metabolic Panel:    Component Value Date/Time   NA 140 06/03/2018 0454   NA 139 01/27/2018 1529   K 3.6 06/03/2018 0454   CL 100 06/03/2018 0454   CO2 27 06/03/2018 0454   BUN 89 (H) 06/03/2018 0454   BUN 99 (HH) 01/27/2018 1529   CREATININE 5.66 (H) 06/03/2018 0454   CREATININE 2.90 (H) 08/11/2015 1359   GLUCOSE 88 06/03/2018 0454   CALCIUM 8.8 (L) 06/03/2018 0454   CBC:    Component Value Date/Time   WBC 6.7 06/03/2018 0454   HGB 7.2 (L) 06/03/2018 0454   HGB 7.3 (L) 01/27/2018 1529   HCT 20.7 (L) 06/03/2018 0454   HCT 21.0 (L) 01/27/2018 1529   PLT 123 (L) 06/03/2018 0454   PLT 180 01/27/2018 1529   MCV 101.0 (H) 06/03/2018 0454   MCV 115 (H) 01/27/2018 1529   NEUTROABS 5.0 06/03/2018 0454   NEUTROABS 3.8 01/27/2018 1529   LYMPHSABS 0.5 (L) 06/03/2018 0454   LYMPHSABS 1.4 01/27/2018 1529   MONOABS 0.7 06/03/2018 0454   EOSABS 0.5 06/03/2018 0454   EOSABS 0.3 01/27/2018 1529   BASOSABS 0.0 06/03/2018 0454   BASOSABS 0.1 01/27/2018 1529    No results found for  this or any previous visit (from the past 240 hour(s)).  Studies/Results: Dg Chest 2 View  Result Date: 06/02/2018 CLINICAL DATA:  Shortness of breath and intermittent generalized chest chest pain which has worsened progressively over the past week. History of CHF, sickle cell disease, hypertension, chronic renal insufficiency, former smoker. EXAM: CHEST - 2 VIEW COMPARISON:  Portable chest x-ray of September 18, 2017 and PA and lateral chest x-ray of December 24, 2015 FINDINGS: The lungs are mildly hyperinflated. The interstitial markings are increased bilaterally in this reflects a change from the  previous study. There is no discrete infiltrate. There is no pleural effusion or pneumothorax. The cardiac silhouette is top-normal in size. The pulmonary vascularity is prominent centrally. There is calcification in the wall of the aortic arch. The bony thorax exhibits no acute abnormality. IMPRESSION: Mild interstitial edema likely secondary to CHF. No alveolar pneumonia nor pleural effusion. Underlying chronic bronchitic-smoking related changes, stable. Thoracic aortic atherosclerosis. Electronically Signed   By: David  Martinique M.D.   On: 06/02/2018 09:53    Medications: Scheduled Meds: . apixaban  5 mg Oral BID  . aspirin EC  81 mg Oral Daily  . calcitRIOL  0.5 mcg Oral Daily  . carvedilol  6.25 mg Oral BID WC  . folic acid  1 mg Oral Daily  . hydrALAZINE  25 mg Oral BID  . hydroxyurea  500 mg Oral Daily  . isosorbide mononitrate  30 mg Oral Daily  . pantoprazole  40 mg Oral Daily   Continuous Infusions: PRN Meds:.  Consultants:  Cardiology  Procedures:  Transfused 1 unit of PRBCs on 06/02/2018  Assessment/Plan: Active Problems:   HTN (hypertension)   Acute on chronic systolic and diastolic heart failure, NYHA class 4 (HCC)   Tobacco abuse   Sickle cell anemia (HCC)   CHF (congestive heart failure) (HCC)   Atrial fibrillation (HCC)   Symptomatic anemia   Hypoxia  Anemia of chronic disease, symptomatic anemia:  Patient received 1 unit of PRBCs on 06/02/2018. Hemoglobin improved to 7.2 from 6.4, which is consistent with baseline of 7-8. Patient says that he missed previous dose of Aranesp due to work constraints.  Patient receives Aranesp 200 mg every 2 weeks, will restart on today.   Chronic kidney disease stage IV:  Followed by Dr. Lorrene Reid. Patient has right arm fistula. Patient has not started hemodialysis. Creatinine level trended down to 5.6, which is consistent with baseline.  Contacted, will restart Aranesp, last injection on 04/29/2018.   Acute on chronic systolic  heart failure.  Patient was admitted with shortness of breath BNP elevated on 06/02/2018 and edema on CXR.  Patient followed by cardiology and will repeat echocardiogram per day.Continue to defer to cardiology.   Pulmonary edema: Evidence of fluid overload.  Patient will received Lasix IV today per cardiology Continue to provide oxygen support.  Ambulating pulse oximetry   Sickle cell anemia without crisis:  Patient denies pain. Continue home medications  Hypertension: Stable. Continue home medications   Atrial fibrillation:  History of atrial fibrillation. Patient was switched from coumadin to Eliquis in April 2019.  Eliquis continued. Patient followed by cardiology. Will defer to cardiology for further workup and evaluation.     Code Status: Full Code Family Communication: N/A Disposition Plan: Not yet ready for discharge  [place], MSN, FNP-C Patient [facility] [address] [place], [place] [address] [phone]  If 5PM-7AM, please contact night-coverage.  06/03/2018, 9:28 AM  LOS: 1 day

## 2018-06-03 NOTE — Progress Notes (Signed)
Per nurses note of 12/16 at 5:10pm [place] Hollis NP gave orders not to administer 2nd unit of blood

## 2018-06-03 NOTE — Progress Notes (Addendum)
Advanced Heart Failure Rounding Note  PCP-Cardiologist: No primary care provider on file.   Subjective:   12/16 Received 1UPRBCs. Hgb 6.4> 7.2 . Diuresed with 120 mg IV lasix. Negative 2.5 liters.   Remains in A fib.   Denies SOB, orthopnea or PND. Feeling much better. No bleeding.    Objective:   Weight Range: 64.9 kg Body mass index is 19.94 kg/m.   Vital Signs:   Temp:  [97.3 F (36.3 C)-98.6 F (37 C)] 97.3 F (36.3 C) (12/17 0533) Pulse Rate:  [53-124] 84 (12/17 0533) Resp:  [11-28] 20 (12/17 0533) BP: (107-156)/(62-94) 123/72 (12/17 0533) SpO2:  [94 %-100 %] 100 % (12/17 0533) Weight:  [64.9 kg-68.9 kg] 64.9 kg (12/17 0241) Last BM Date: 06/02/18  Weight change: Filed Weights   06/02/18 0927 06/02/18 1854 06/03/18 0241  Weight: 68.9 kg 65.4 kg 64.9 kg    Intake/Output:   Intake/Output Summary (Last 24 hours) at 06/03/2018 0722 Last data filed at 06/03/2018 0241 Gross per 24 hour  Intake 480 ml  Output 3075 ml  Net -2595 ml      Physical Exam    General: In bed.  No resp difficulty HEENT: Normal Neck: Supple. JVP 10-11 . Carotids 2+ bilat; no bruits. No lymphadenopathy or thyromegaly appreciated. Cor: PMI nondisplaced. Irregular  rate & rhythm. No rubs, gallops or murmurs. Lungs: Clear on 1 liters.  Abdomen: Soft, nontender, nondistended. No hepatosplenomegaly. No bruits or masses. Good bowel sounds. Extremities: No cyanosis, clubbing, rash, edema Neuro: Alert & orientedx3, cranial nerves grossly intact. moves all 4 extremities w/o difficulty. Affect pleasant   Telemetry   A fib 80-90s .dbor   EKG    N/a   Labs    CBC Recent Labs    06/02/18 0934 06/03/18 0454  WBC 7.3 PENDING  NEUTROABS 5.1 PENDING  HGB 6.4* 7.2*  HCT 18.6* 20.7*  MCV 104.5* 101.0*  PLT 128* 562*   Basic Metabolic Panel Recent Labs    06/02/18 0934 06/03/18 0454  NA 136 140  K 4.2 3.6  CL 98 100  CO2 26 27  GLUCOSE 97 88  BUN 91* 89*  CREATININE  5.91* 5.66*  CALCIUM 8.7* 8.8*   Liver Function Tests Recent Labs    06/02/18 0934 06/03/18 0454  AST 48* 33  ALT 37 30  ALKPHOS 63 56  BILITOT 1.5* 1.7*  PROT 6.4* 6.2*  ALBUMIN 3.4* 3.1*   No results for input(s): LIPASE, AMYLASE in the last 72 hours. Cardiac Enzymes No results for input(s): CKTOTAL, CKMB, CKMBINDEX, TROPONINI in the last 72 hours.  BNP: BNP (last 3 results) Recent Labs    06/02/18 0934  BNP 1,644.3*    ProBNP (last 3 results) No results for input(s): PROBNP in the last 8760 hours.   D-Dimer No results for input(s): DDIMER in the last 72 hours. Hemoglobin A1C No results for input(s): HGBA1C in the last 72 hours. Fasting Lipid Panel No results for input(s): CHOL, HDL, LDLCALC, TRIG, CHOLHDL, LDLDIRECT in the last 72 hours. Thyroid Function Tests No results for input(s): TSH, T4TOTAL, T3FREE, THYROIDAB in the last 72 hours.  Invalid input(s): FREET3  Other results:   Imaging    Dg Chest 2 View  Result Date: 06/02/2018 CLINICAL DATA:  Shortness of breath and intermittent generalized chest chest pain which has worsened progressively over the past week. History of CHF, sickle cell disease, hypertension, chronic renal insufficiency, former smoker. EXAM: CHEST - 2 VIEW COMPARISON:  Portable chest x-ray  of September 18, 2017 and [place] and lateral chest x-ray of December 24, 2015 FINDINGS: The lungs are mildly hyperinflated. The interstitial markings are increased bilaterally in this reflects a change from the previous study. There is no discrete infiltrate. There is no pleural effusion or pneumothorax. The cardiac silhouette is top-normal in size. The pulmonary vascularity is prominent centrally. There is calcification in the wall of the aortic arch. The bony thorax exhibits no acute abnormality. IMPRESSION: Mild interstitial edema likely secondary to CHF. No alveolar pneumonia nor pleural effusion. Underlying chronic bronchitic-smoking related changes, stable.  Thoracic aortic atherosclerosis. Electronically Signed   By: David  Martinique M.D.   On: 06/02/2018 09:53      Medications:     Scheduled Medications: . apixaban  5 mg Oral BID  . aspirin EC  81 mg Oral Daily  . calcitRIOL  0.5 mcg Oral Daily  . carvedilol  3.125 mg Oral BID WC  . folic acid  1 mg Oral Daily  . furosemide  80 mg Intravenous BID  . hydrALAZINE  25 mg Oral BID  . hydroxyurea  500 mg Oral Daily  . isosorbide mononitrate  30 mg Oral Daily  . pantoprazole  40 mg Oral Daily     Infusions:   PRN Medications:      Patient Profile   Mr Chauvin is a legally blind 62 year old with a history of sickle cell anemia, gout, chronic systolic heart failure, NICM, CKD Stage IV (has RUE AVF) , PAF, GI bleed (coumadin stopped and eliquis started), anemia, and tobacco abuse.  Admitted with symptomatic anemia, a fib, and a/c systolic hf.      Assessment/Plan  1. Symptomatic Anemia, sickle cell anemia    Had  GI bleeding 09/2017. Had EGD/Colonoscpopy that showed gastritis and internal hemorrhoids. Coumadin was stopped and eliquis was started He has been receiving aranesp every other week.   Received 1UPRBC 12/16. Hgb up from 6.4 to 7.2.   Continue ppi.   2. A/C Systolic Heart Failure, NICM   ECHO 07/2017 EF 40-45%. Plan to repeat ECHO.   Admitted with shortness of breath at rest but this is complicated by anemia and suspect A fib has also worsened HF.  -BNP elevated and CXR with edema.  - Volume status improving. Give one dose of IV lasix today.  - Increase carvedilol 6.25 mg twice a day.  - Continue hydralazine 25 mg tiwce a day + imdur 30 mg daily.   3. A fib  First went in A fib in 22017 and spontaneously converted.  In April of this year he was in NSR.  He was taken off coumadin in April of this year due to GI bleed and started on eliquis.  Continue eliqius 5 mg twice a day. No over signs of bleeding.  Rate controlled. If he doesn't convert will need  cardioversion.  IF EF down will need to try and restore NSR.   4. HTN  Stable. Increase carvedilol 6.25 mg twice a day.   5. CKD Stage IV Followed by Lorrene Reid. Has AVF  But has not started hemodialysis.  Creatinine baseline 5.5.-5.8  Good urine output noted. Creatinine trending down 5.8>5.6   6. Legally Blind     Medication concerns reviewed with patient and pharmacy team. Barriers identified: none   Length of Stay: 1  Amy [name], NP  06/03/2018, 7:22 AM  Advanced Heart Failure Team Pager [phone] (M-F; 7a - 4p)  Please contact [facility] for night-coverage after hours (4p -  7a ) and weekends on [email or web address]   Patient seen and examined with [name], NP. We discussed all aspects of the encounter. I agree with the assessment and plan as stated above.   Volumes status improving but still overloaded. Creatinine stable. Continue IV lasix. Remains in AF on IV amio. Will continue amio. Tolerating Eliquis - need to continue for possible DC-CV. Will watch to make sure hgb stable for at least another 24 hours. If remains in AF will plan DC-CV on Thursday or Friday. Hgb improving with transfusion. May need 1 more unit.   Glori Bickers, MD  10:00 AM

## 2018-06-04 DIAGNOSIS — I4819 Other persistent atrial fibrillation: Secondary | ICD-10-CM

## 2018-06-04 DIAGNOSIS — I509 Heart failure, unspecified: Secondary | ICD-10-CM

## 2018-06-04 LAB — RETICULOCYTES
Immature Retic Fract: 30.4 % — ABNORMAL HIGH (ref 2.3–15.9)
RBC.: 2.04 MIL/uL — ABNORMAL LOW (ref 4.22–5.81)
Retic Count, Absolute: 187.8 10*3/uL — ABNORMAL HIGH (ref 19.0–186.0)
Retic Ct Pct: 9.4 % — ABNORMAL HIGH (ref 0.4–3.1)

## 2018-06-04 LAB — CBC WITH DIFFERENTIAL/PLATELET
Abs Immature Granulocytes: 0.11 10*3/uL — ABNORMAL HIGH (ref 0.00–0.07)
Basophils Absolute: 0.1 10*3/uL (ref 0.0–0.1)
Basophils Relative: 1 %
Eosinophils Absolute: 0.2 10*3/uL (ref 0.0–0.5)
Eosinophils Relative: 3 %
HCT: 20.7 % — ABNORMAL LOW (ref 39.0–52.0)
Hemoglobin: 7.2 g/dL — ABNORMAL LOW (ref 13.0–17.0)
Immature Granulocytes: 2 %
Lymphocytes Relative: 8 %
Lymphs Abs: 0.6 10*3/uL — ABNORMAL LOW (ref 0.7–4.0)
MCH: 35.3 pg — ABNORMAL HIGH (ref 26.0–34.0)
MCHC: 34.8 g/dL (ref 30.0–36.0)
MCV: 101.5 fL — ABNORMAL HIGH (ref 80.0–100.0)
Monocytes Absolute: 1.1 10*3/uL — ABNORMAL HIGH (ref 0.1–1.0)
Monocytes Relative: 15 %
Neutro Abs: 5.1 10*3/uL (ref 1.7–7.7)
Neutrophils Relative %: 71 %
Platelets: 142 10*3/uL — ABNORMAL LOW (ref 150–400)
RBC: 2.04 MIL/uL — ABNORMAL LOW (ref 4.22–5.81)
RDW: 21 % — ABNORMAL HIGH (ref 11.5–15.5)
WBC: 7.2 10*3/uL (ref 4.0–10.5)
nRBC: 10.5 % — ABNORMAL HIGH (ref 0.0–0.2)

## 2018-06-04 LAB — IRON AND TIBC
Iron: 42 ug/dL — ABNORMAL LOW (ref 45–182)
Saturation Ratios: 13 % — ABNORMAL LOW (ref 17.9–39.5)
TIBC: 325 ug/dL (ref 250–450)
UIBC: 283 ug/dL

## 2018-06-04 LAB — BASIC METABOLIC PANEL
Anion gap: 13 (ref 5–15)
BUN: 82 mg/dL — ABNORMAL HIGH (ref 6–20)
CO2: 26 mmol/L (ref 22–32)
Calcium: 8.4 mg/dL — ABNORMAL LOW (ref 8.9–10.3)
Chloride: 98 mmol/L (ref 98–111)
Creatinine, Ser: 5.44 mg/dL — ABNORMAL HIGH (ref 0.61–1.24)
GFR calc Af Amer: 12 mL/min — ABNORMAL LOW (ref >60)
GFR calc non Af Amer: 11 mL/min — ABNORMAL LOW (ref >60)
Glucose, Bld: 112 mg/dL — ABNORMAL HIGH (ref 70–99)
Potassium: 3.6 mmol/L (ref 3.5–5.1)
Sodium: 137 mmol/L (ref 135–145)

## 2018-06-04 LAB — URINE CULTURE: Culture: NO GROWTH

## 2018-06-04 LAB — MAGNESIUM: Magnesium: 2.1 mg/dL (ref 1.7–2.4)

## 2018-06-04 LAB — FERRITIN: Ferritin: 17 ng/mL — ABNORMAL LOW (ref 24–336)

## 2018-06-04 LAB — VITAMIN B12: Vitamin B-12: 908 pg/mL (ref 180–914)

## 2018-06-04 LAB — FOLATE: Folate: 44 ng/mL (ref >5.9)

## 2018-06-04 MED ORDER — FUROSEMIDE 10 MG/ML IJ SOLN
80.0000 mg | Freq: Once | INTRAMUSCULAR | Status: AC
  Administered 2018-06-04: 80 mg via INTRAVENOUS
  Filled 2018-06-04: qty 8

## 2018-06-04 MED ORDER — SODIUM CHLORIDE 0.9% FLUSH: 3.0000 mL | INTRAVENOUS | Status: DC | PRN

## 2018-06-04 MED ORDER — SODIUM CHLORIDE 0.9 % IV SOLN
250.0000 mL | INTRAVENOUS | Status: DC
  Administered 2018-06-05: 500 mL via INTRAVENOUS

## 2018-06-04 MED ORDER — CARVEDILOL 6.25 MG PO TABS
9.3750 mg | ORAL_TABLET | Freq: Two times a day (BID) | ORAL | Status: DC
  Administered 2018-06-04 – 2018-06-05 (×3): 9.375 mg via ORAL
  Filled 2018-06-04 (×4): qty 1

## 2018-06-04 MED ORDER — TORSEMIDE 100 MG PO TABS
100.0000 mg | ORAL_TABLET | Freq: Every day | ORAL | Status: DC
  Administered 2018-06-04: 100 mg via ORAL
  Filled 2018-06-04: qty 1

## 2018-06-04 MED ORDER — SODIUM CHLORIDE 0.9% FLUSH
3.0000 mL | Freq: Two times a day (BID) | INTRAVENOUS | Status: DC
  Administered 2018-06-04 – 2018-06-06 (×5): 3 mL via INTRAVENOUS

## 2018-06-04 NOTE — Plan of Care (Signed)
  Problem: Safety: Goal: Ability to remain free from injury will improve Outcome: Progressing   

## 2018-06-04 NOTE — H&P (View-Only) (Signed)
Advanced Heart Failure Rounding Note  PCP-Cardiologist: No primary care provider on file.   Subjective:   12/16 Received 1UPRBCs. Hgb 6.4> 7.2 . Diuresed with 120 mg IV lasix. Negative 2.5 liters.   Remains in A fib/flutter on amio drip at 30 mg per hour. Hgb 7.2.   Yesterday he received one dose of IV lasix. Weight stable 143 pounds.  Feeling much better. Denies SOB.    Objective:   Weight Range: 65 kg Body mass index is 19.99 kg/m.   Vital Signs:   Temp:  [97.4 F (36.3 C)-98.2 F (36.8 C)] 98.2 F (36.8 C) (12/18 0519) Pulse Rate:  [59-75] 59 (12/18 0519) Resp:  [18-20] 20 (12/18 0519) BP: (110-124)/(65-89) 112/65 (12/18 0519) SpO2:  [98 %-100 %] 98 % (12/18 0519) Weight:  [65 kg] 65 kg (12/18 0129) Last BM Date: 06/02/18  Weight change: Filed Weights   06/02/18 1854 06/03/18 0241 06/04/18 0129  Weight: 65.4 kg 64.9 kg 65 kg    Intake/Output:   Intake/Output Summary (Last 24 hours) at 06/04/2018 0834 Last data filed at 06/04/2018 0545 Gross per 24 hour  Intake 1682.67 ml  Output 2700 ml  Net -1017.33 ml      Physical Exam    General:  Well appearing. No resp difficulty. In bed.  HEENT: normal Neck: supple. JVP 8-9 Carotids 2+ bilat; no bruits. No lymphadenopathy or thryomegaly appreciated. Cor: PMI nondisplaced. Irregular  rate & rhythm. No rubs, gallops or murmurs. Lungs: clear Abdomen: soft, nontender, nondistended. No hepatosplenomegaly. No bruits or masses. Good bowel sounds. Extremities: no cyanosis, clubbing, rash, edema Neuro: alert & orientedx3, cranial nerves grossly intact. moves all 4 extremities w/o difficulty. Affect pleasant  Telemetry   A fib/flutter 70-80s.    EKG    N/a   Labs    CBC Recent Labs    06/03/18 0454 06/04/18 0447  WBC 6.7 7.2  NEUTROABS 5.0 5.1  HGB 7.2* 7.2*  HCT 20.7* 20.7*  MCV 101.0* 101.5*  PLT 123* 244*   Basic Metabolic Panel Recent Labs    06/03/18 0454 06/04/18 0447  NA 140 137  K  3.6 3.6  CL 100 98  CO2 27 26  GLUCOSE 88 112*  BUN 89* 82*  CREATININE 5.66* 5.44*  CALCIUM 8.8* 8.4*  MG  --  2.1   Liver Function Tests Recent Labs    06/02/18 0934 06/03/18 0454  AST 48* 33  ALT 37 30  ALKPHOS 63 56  BILITOT 1.5* 1.7*  PROT 6.4* 6.2*  ALBUMIN 3.4* 3.1*   No results for input(s): LIPASE, AMYLASE in the last 72 hours. Cardiac Enzymes No results for input(s): CKTOTAL, CKMB, CKMBINDEX, TROPONINI in the last 72 hours.  BNP: BNP (last 3 results) Recent Labs    06/02/18 0934  BNP 1,644.3*    ProBNP (last 3 results) No results for input(s): PROBNP in the last 8760 hours.   D-Dimer No results for input(s): DDIMER in the last 72 hours. Hemoglobin A1C No results for input(s): HGBA1C in the last 72 hours. Fasting Lipid Panel No results for input(s): CHOL, HDL, LDLCALC, TRIG, CHOLHDL, LDLDIRECT in the last 72 hours. Thyroid Function Tests No results for input(s): TSH, T4TOTAL, T3FREE, THYROIDAB in the last 72 hours.  Invalid input(s): FREET3  Other results:   Imaging    No results found.   Medications:     Scheduled Medications: . sodium chloride   Intravenous Once  . apixaban  5 mg Oral BID  . aspirin  EC  81 mg Oral Daily  . calcitRIOL  0.5 mcg Oral Daily  . carvedilol  6.25 mg Oral BID WC  . folic acid  1 mg Oral Daily  . hydrALAZINE  25 mg Oral BID  . hydroxyurea  500 mg Oral Daily  . isosorbide mononitrate  30 mg Oral Daily  . pantoprazole  40 mg Oral Daily    Infusions: . amiodarone 30 mg/hr (06/03/18 2259)    PRN Medications:      Patient Profile   Mr Metzgar is a legally blind 62 year old with a history of sickle cell anemia, gout, chronic systolic heart failure, NICM, CKD Stage IV (has RUE AVF) , PAF, GI bleed (coumadin stopped and eliquis started), anemia, and tobacco abuse.  Admitted with symptomatic anemia, a fib, and a/c systolic hf.      Assessment/Plan  1. Symptomatic Anemia, sickle cell anemia    Had   GI bleeding 09/2017. Had EGD/Colonoscpopy that showed gastritis and internal hemorrhoids. Coumadin was stopped and eliquis was started He has been receiving aranesp every other week.   Received 1UPRBC 12/16. Hgb up from 6.4 to 7.2. Todays hgb is 7.2. Stable. No overt signs of bleeding.   Continue ppi.   2. A/C Systolic Heart Failure, NICM   ECHO 07/2017 EF 40-45%. ECHO completed and EF has dropped to 25-30%.   Admitted with shortness of breath at rest but this is complicated by anemia and suspect A fib has also worsened HF.  -BNP elevated and CXR with edema.  -Given EF is down will try to restore NSR.  - Volume status ok. Start torsemide 100 mg daily which is his home dose of diuretics.   - Increase carvedilol 9.375 mg twice a day.   - Continue hydralazine 25 mg tiwce a day + imdur 30 mg daily.  -Consult cardiac rehab.   3. A fib  First went in A fib in 2017 and spontaneously converted.  In April of this year he was in NSR.  He was taken off coumadin in April of this year due to GI bleed and started on eliquis.  Continue eliqius 5 mg twice a day.He has not missed any dose of eliquis.  No overt signs of bleeding.  Rate controlled. If he doesn't convert will need cardioversion. Set up for DC_CV tomorrow.  ECHO completed and EF has dropped to 25-30%.   4. HTN  Increase carvedilol 9.375 mg twice a day.    5. CKD Stage IV Followed by Lorrene Reid. Has AVF  But has not started hemodialysis.  Creatinine baseline 5.5.-5.8  Good urine output noted. Creatinine trending down 5.8>5.6 >5.44  6. Legally Blind   Set up DC-CV Thur or Friday.   Medication concerns reviewed with patient and pharmacy team. Barriers identified: none   Length of Stay: 2  [name], NP  06/04/2018, 8:34 AM  Advanced Heart Failure Team Pager [phone] (M-F; 7a - 4p)  Please contact [facility] for night-coverage after hours (4p -7a ) and weekends on [email or web address]  Patient seen with NP, agree with the above note  with the below changes.   He remains in atrial fibrillation.  On exam, he still looks volume overloaded to me.  Creatinine is stable.  He got po torsemide this morning.  I will give him 1 dose of Lasix 80 mg IV this evening and can reassess tomorrow.   He will be set up for DCCV tomorrow or Friday. Discussed risks/benefits and he agrees to procedure.  He has not missed any apixaban doses recently.   Loralie Champagne 06/04/2018 2:36 PM

## 2018-06-04 NOTE — Plan of Care (Signed)
  Problem: Elimination: Goal: Will not experience complications related to bowel motility Outcome: Progressing   Problem: Safety: Goal: Ability to remain free from injury will improve Outcome: Progressing   

## 2018-06-04 NOTE — Progress Notes (Signed)
Subjective: Patrick [name], a 62 year old male with a medical history significant for CHF, nonischemic cardiomyopathy, sickle cell anemia, HbSC, end-stage kidney disease, legally blind and tobacco dependent was admitted on 06/02/2018 with symptomatic anemia.   Patient says that he feels better. Patient received 1 unit of PRBCs on 06/02/2018 and aranesp on 06/03/2018, hemoglobin is stable at 7.2. Patient denies dizziness, shortness of breath, chest pain, dysuria, nausea, vomiting, diarrhea or constipation.   Objective:  Vital signs in last 24 hours:  Vitals:   06/03/18 1258 06/03/18 2000 06/04/18 0129 06/04/18 0519  BP: 124/89 110/76  112/65  Pulse: 75 73  (!) 59  Resp: 18 20  20   Temp: (!) 97.4 F (36.3 C) 98.2 F (36.8 C)  98.2 F (36.8 C)  TempSrc: Oral Oral  Oral  SpO2: 100% 98%  98%  Weight:   65 kg   Height:        Intake/Output from previous day:   Intake/Output Summary (Last 24 hours) at 06/04/2018 1045 Last data filed at 06/04/2018 0900 Gross per 24 hour  Intake 1742.67 ml  Output 2900 ml  Net -1157.33 ml    Physical Exam: General: Alert, awake, oriented x3, in no acute distress.  HEENT: [place]/AT PEERL, EOMI Neck: Trachea midline,  no masses, no thyromegal,y no JVD, no carotid bruit OROPHARYNX:  Moist, No exudate/ erythema/lesions.  Heart: Regular rate and rhythm, without murmurs, rubs, gallops, PMI non-displaced, no heaves or thrills on palpation.  Lungs: Clear to auscultation, no wheezing or rhonchi noted. No increased vocal fremitus resonant to percussion  Abdomen: Soft, nontender, positive bowel sounds, no masses no hepatosplenomegaly noted..  Neuro: No focal neurological deficits noted cranial nerves II through XII grossly intact. DTRs 2+ bilaterally upper and lower extremities. Strength 5 out of 5 in bilateral upper and lower extremities. Musculoskeletal: No warm swelling or erythema around joints, no spinal tenderness noted. Psychiatric: Patient alert and  oriented x3, good insight and cognition, good recent to remote recall. Lymph node survey: No cervical axillary or inguinal lymphadenopathy noted.  Lab Results:  Basic Metabolic Panel:    Component Value Date/Time   NA 137 06/04/2018 0447   NA 139 01/27/2018 1529   K 3.6 06/04/2018 0447   CL 98 06/04/2018 0447   CO2 26 06/04/2018 0447   BUN 82 (H) 06/04/2018 0447   BUN 99 (HH) 01/27/2018 1529   CREATININE 5.44 (H) 06/04/2018 0447   CREATININE 2.90 (H) 08/11/2015 1359   GLUCOSE 112 (H) 06/04/2018 0447   CALCIUM 8.4 (L) 06/04/2018 0447   CBC:    Component Value Date/Time   WBC 7.2 06/04/2018 0447   HGB 7.2 (L) 06/04/2018 0447   HGB 7.3 (L) 01/27/2018 1529   HCT 20.7 (L) 06/04/2018 0447   HCT 21.0 (L) 01/27/2018 1529   PLT 142 (L) 06/04/2018 0447   PLT 180 01/27/2018 1529   MCV 101.5 (H) 06/04/2018 0447   MCV 115 (H) 01/27/2018 1529   NEUTROABS 5.1 06/04/2018 0447   NEUTROABS 3.8 01/27/2018 1529   LYMPHSABS 0.6 (L) 06/04/2018 0447   LYMPHSABS 1.4 01/27/2018 1529   MONOABS 1.1 (H) 06/04/2018 0447   EOSABS 0.2 06/04/2018 0447   EOSABS 0.3 01/27/2018 1529   BASOSABS 0.1 06/04/2018 0447   BASOSABS 0.1 01/27/2018 1529    Recent Results (from the past 240 hour(s))  Urine culture     Status: None   Collection Time: 06/02/18  5:09 PM  Result Value Ref Range Status   Specimen Description URINE,  RANDOM  Final   Special Requests NONE  Final   Culture   Final    NO GROWTH Performed at [facility], [place] [address], [place], [place] [address]    Report Status 06/04/2018 FINAL  Final    Studies/Results: No results found.  Medications: Scheduled Meds: . sodium chloride   Intravenous Once  . apixaban  5 mg Oral BID  . aspirin EC  81 mg Oral Daily  . calcitRIOL  0.5 mcg Oral Daily  . carvedilol  9.375 mg Oral BID WC  . folic acid  1 mg Oral Daily  . hydrALAZINE  25 mg Oral BID  . hydroxyurea  500 mg Oral Daily  . isosorbide mononitrate  30 mg Oral Daily  .  pantoprazole  40 mg Oral Daily  . sodium chloride flush  3 mL Intravenous Q12H  . torsemide  100 mg Oral Daily   Continuous Infusions: . sodium chloride    . amiodarone 30 mg/hr (06/03/18 2259)   PRN Meds:.  Consultants:  Cardiology  Procedures:  Transfused 1 unit of PRBCs on 06/02/2018  Assessment/Plan: Active Problems:   HTN (hypertension)   Acute on chronic systolic and diastolic heart failure, NYHA class 4 (HCC)   Tobacco abuse   Sickle cell anemia (HCC)   CHF (congestive heart failure) (HCC)   Atrial fibrillation (HCC)   Symptomatic anemia   Hypoxia  Anemia of chronic disease, symptomatic anemia:  Patient is s/p 1 unit of PRBCs. Restarted Aranesp 200 mcg on 06/03/2018. Patient receives every other week.  Hemoglobin stable, continue to follow CBC  Chronic kidney disease stage IV:  Followed by Dr. Lorrene Reid. Patient has right arm fistula. Patient has not started hemodialysis. Creatinine level trended down to 5.6, which is consistent with baseline.  Contacted, will restart Aranesp, last injection on 04/29/2018.   Acute on chronic systolic heart failure.  Patient was admitted with shortness of breath BNP elevated on 06/02/2018 and edema on CXR.  Torsemide 100 mg daily restarted, which is home dose of diuretics Cardiac rehab consulted per cardiology  Pulmonary edema: Evidence of fluid overload.  . Continue to provide oxygen support.  Ambulating pulse oximetry.   Sickle cell anemia without crisis:  Patient denies pain. Continue home medications  Hypertension: Stable. Continue home medications   Atrial fibrillation:  History of atrial fibrillation. Patient was switched from coumadin to Eliquis in April 2019. Per cardiology, if patient does not convert, cardioversion is set up for tomorrow ECHO shows that EF has dropped to 25-30% Eliquis continued Will continue to defer to cardiology   Code Status: Full Code Family Communication: N/A Disposition Plan: Not  yet ready for discharge  [place], MSN, FNP-C Patient [place], [place] [address] [phone]  If 5PM-7AM, please contact night-coverage.  06/04/2018, 10:45 AM  LOS: 2 days

## 2018-06-04 NOTE — Progress Notes (Signed)
CARDIAC REHAB PHASE I   PRE:  Rate/Rhythm: 80 afib  BP:  Supine: 107/69  Sitting:   Standing:    SaO2: 95%RA  MODE:  Ambulation: 470 ft   POST:  Rate/Rhythm: 96 afib  BP:  Supine:   Sitting: 120/76  Standing:    SaO2: 96-99%RA 1335-1417 Pt walked 470 ft on RA with IV pole with steady gait. Tolerated well. Gave pt CHF booklet and reviewed zones. Encouraged pt to weigh every day. Pt stated he tries to adhere to 1500 mg sodium restriction.  Gave low sodium diets.  Reviewed when to call MD with signs of CHF.   Graylon Good, RN BSN  06/04/2018 2:13 PM

## 2018-06-04 NOTE — Progress Notes (Addendum)
Advanced Heart Failure Rounding Note  PCP-Cardiologist: No primary care provider on file.   Subjective:   12/16 Received 1UPRBCs. Hgb 6.4> 7.2 . Diuresed with 120 mg IV lasix. Negative 2.5 liters.   Remains in A fib/flutter on amio drip at 30 mg per hour. Hgb 7.2.   Yesterday he received one dose of IV lasix. Weight stable 143 pounds.  Feeling much better. Denies SOB.    Objective:   Weight Range: 65 kg Body mass index is 19.99 kg/m.   Vital Signs:   Temp:  [97.4 F (36.3 C)-98.2 F (36.8 C)] 98.2 F (36.8 C) (12/18 0519) Pulse Rate:  [59-75] 59 (12/18 0519) Resp:  [18-20] 20 (12/18 0519) BP: (110-124)/(65-89) 112/65 (12/18 0519) SpO2:  [98 %-100 %] 98 % (12/18 0519) Weight:  [65 kg] 65 kg (12/18 0129) Last BM Date: 06/02/18  Weight change: Filed Weights   06/02/18 1854 06/03/18 0241 06/04/18 0129  Weight: 65.4 kg 64.9 kg 65 kg    Intake/Output:   Intake/Output Summary (Last 24 hours) at 06/04/2018 0834 Last data filed at 06/04/2018 0545 Gross per 24 hour  Intake 1682.67 ml  Output 2700 ml  Net -1017.33 ml      Physical Exam    General:  Well appearing. No resp difficulty. In bed.  HEENT: normal Neck: supple. JVP 8-9 Carotids 2+ bilat; no bruits. No lymphadenopathy or thryomegaly appreciated. Cor: PMI nondisplaced. Irregular  rate & rhythm. No rubs, gallops or murmurs. Lungs: clear Abdomen: soft, nontender, nondistended. No hepatosplenomegaly. No bruits or masses. Good bowel sounds. Extremities: no cyanosis, clubbing, rash, edema Neuro: alert & orientedx3, cranial nerves grossly intact. moves all 4 extremities w/o difficulty. Affect pleasant  Telemetry   A fib/flutter 70-80s.    EKG    N/a   Labs    CBC Recent Labs    06/03/18 0454 06/04/18 0447  WBC 6.7 7.2  NEUTROABS 5.0 5.1  HGB 7.2* 7.2*  HCT 20.7* 20.7*  MCV 101.0* 101.5*  PLT 123* 767*   Basic Metabolic Panel Recent Labs    06/03/18 0454 06/04/18 0447  NA 140 137  K  3.6 3.6  CL 100 98  CO2 27 26  GLUCOSE 88 112*  BUN 89* 82*  CREATININE 5.66* 5.44*  CALCIUM 8.8* 8.4*  MG  --  2.1   Liver Function Tests Recent Labs    06/02/18 0934 06/03/18 0454  AST 48* 33  ALT 37 30  ALKPHOS 63 56  BILITOT 1.5* 1.7*  PROT 6.4* 6.2*  ALBUMIN 3.4* 3.1*   No results for input(s): LIPASE, AMYLASE in the last 72 hours. Cardiac Enzymes No results for input(s): CKTOTAL, CKMB, CKMBINDEX, TROPONINI in the last 72 hours.  BNP: BNP (last 3 results) Recent Labs    06/02/18 0934  BNP 1,644.3*    ProBNP (last 3 results) No results for input(s): PROBNP in the last 8760 hours.   D-Dimer No results for input(s): DDIMER in the last 72 hours. Hemoglobin A1C No results for input(s): HGBA1C in the last 72 hours. Fasting Lipid Panel No results for input(s): CHOL, HDL, LDLCALC, TRIG, CHOLHDL, LDLDIRECT in the last 72 hours. Thyroid Function Tests No results for input(s): TSH, T4TOTAL, T3FREE, THYROIDAB in the last 72 hours.  Invalid input(s): FREET3  Other results:   Imaging    No results found.   Medications:     Scheduled Medications: . sodium chloride   Intravenous Once  . apixaban  5 mg Oral BID  . aspirin  EC  81 mg Oral Daily  . calcitRIOL  0.5 mcg Oral Daily  . carvedilol  6.25 mg Oral BID WC  . folic acid  1 mg Oral Daily  . hydrALAZINE  25 mg Oral BID  . hydroxyurea  500 mg Oral Daily  . isosorbide mononitrate  30 mg Oral Daily  . pantoprazole  40 mg Oral Daily    Infusions: . amiodarone 30 mg/hr (06/03/18 2259)    PRN Medications:      Patient Profile   Patrick [name] is a legally blind 62 year old with a history of sickle cell anemia, gout, chronic systolic heart failure, NICM, CKD Stage IV (has RUE AVF) , PAF, GI bleed (coumadin stopped and eliquis started), anemia, and tobacco abuse.  Admitted with symptomatic anemia, a fib, and a/c systolic hf.      Assessment/Plan  1. Symptomatic Anemia, sickle cell anemia    Had   GI bleeding 09/2017. Had EGD/Colonoscpopy that showed gastritis and internal hemorrhoids. Coumadin was stopped and eliquis was started He has been receiving aranesp every other week.   Received 1UPRBC 12/16. Hgb up from 6.4 to 7.2. Todays hgb is 7.2. Stable. No overt signs of bleeding.   Continue ppi.   2. A/C Systolic Heart Failure, NICM   ECHO 07/2017 EF 40-45%. ECHO completed and EF has dropped to 25-30%.   Admitted with shortness of breath at rest but this is complicated by anemia and suspect A fib has also worsened HF.  -BNP elevated and CXR with edema.  -Given EF is down will try to restore NSR.  - Volume status ok. Start torsemide 100 mg daily which is his home dose of diuretics.   - Increase carvedilol 9.375 mg twice a day.   - Continue hydralazine 25 mg tiwce a day + imdur 30 mg daily.  -Consult cardiac rehab.   3. A fib  First went in A fib in 2017 and spontaneously converted.  In April of this year he was in NSR.  He was taken off coumadin in April of this year due to GI bleed and started on eliquis.  Continue eliqius 5 mg twice a day.He has not missed any dose of eliquis.  No overt signs of bleeding.  Rate controlled. If he doesn't convert will need cardioversion. Set up for DC_CV tomorrow.  ECHO completed and EF has dropped to 25-30%.   4. HTN  Increase carvedilol 9.375 mg twice a day.    5. CKD Stage IV Followed by Patrick [name]. Has AVF  But has not started hemodialysis.  Creatinine baseline 5.5.-5.8  Good urine output noted. Creatinine trending down 5.8>5.6 >5.44  6. Legally Blind   Set up DC-CV Thur or Friday.   Medication concerns reviewed with patient and pharmacy team. Barriers identified: none   Length of Stay: 2  Patrick Grinder, NP  06/04/2018, 8:34 AM  Advanced Heart Failure Team Pager [phone] (M-F; 7a - 4p)  Please contact [facility] for night-coverage after hours (4p -7a ) and weekends on [email or web address]  Patient seen with NP, agree with the above note  with the below changes.   He remains in atrial fibrillation.  On exam, he still looks volume overloaded to me.  Creatinine is stable.  He got po torsemide this morning.  I will give him 1 dose of Lasix 80 mg IV this evening and can reassess tomorrow.   He will be set up for DCCV tomorrow or Friday. Discussed risks/benefits and he agrees to procedure.  He has not missed any apixaban doses recently.   Loralie Champagne 06/04/2018 2:36 PM

## 2018-06-04 NOTE — Consult Note (Signed)
   [facility] CM Inpatient Consult   06/04/2018  Patrick [name] 10-29-1955 [id number]  Patient evaluated for community based chronic disease management services with [facility] as a benefit of patient's Medicare Insurance. Spoke with patient at bedside to explain [facility] services. Patient had been previously active with a [facility] Telephonic nurse.  Patient with [employer].  Consent form signed.  Patient will receive post hospital discharge call.   Left contact information and [facility] literature at bedside. Of note, [facility] Care Management services does not replace or interfere with any services that are arranged by inpatient case management or social work.  For additional questions or referrals please contact:    Natividad Brood, RN BSN [facility] Liaison  [phone] business mobile phone Toll free office [phone]

## 2018-06-05 ENCOUNTER — Inpatient Hospital Stay (HOSPITAL_COMMUNITY): Payer: PPO | Admitting: Anesthesiology

## 2018-06-05 ENCOUNTER — Encounter (HOSPITAL_COMMUNITY): Payer: Self-pay

## 2018-06-05 ENCOUNTER — Encounter (HOSPITAL_COMMUNITY)
Admission: EM | Disposition: A | Discharge status: Home / Self Care | Payer: Self-pay | Source: Home / Self Care | Attending: Internal Medicine

## 2018-06-05 HISTORY — PX: CARDIOVERSION: SHX1299

## 2018-06-05 LAB — BASIC METABOLIC PANEL
Anion gap: 12 (ref 5–15)
BUN: 77 mg/dL — ABNORMAL HIGH (ref 6–20)
CO2: 25 mmol/L (ref 22–32)
Calcium: 8.5 mg/dL — ABNORMAL LOW (ref 8.9–10.3)
Chloride: 98 mmol/L (ref 98–111)
Creatinine, Ser: 5.27 mg/dL — ABNORMAL HIGH (ref 0.61–1.24)
GFR calc Af Amer: 13 mL/min — ABNORMAL LOW (ref >60)
GFR calc non Af Amer: 11 mL/min — ABNORMAL LOW (ref >60)
Glucose, Bld: 108 mg/dL — ABNORMAL HIGH (ref 70–99)
Potassium: 3.6 mmol/L (ref 3.5–5.1)
Sodium: 135 mmol/L (ref 135–145)

## 2018-06-05 LAB — CBC
HCT: 21.3 % — ABNORMAL LOW (ref 39.0–52.0)
Hemoglobin: 7.4 g/dL — ABNORMAL LOW (ref 13.0–17.0)
MCH: 34.9 pg — ABNORMAL HIGH (ref 26.0–34.0)
MCHC: 34.7 g/dL (ref 30.0–36.0)
MCV: 100.5 fL — ABNORMAL HIGH (ref 80.0–100.0)
Platelets: 147 10*3/uL — ABNORMAL LOW (ref 150–400)
RBC: 2.12 MIL/uL — ABNORMAL LOW (ref 4.22–5.81)
RDW: 19.8 % — ABNORMAL HIGH (ref 11.5–15.5)
WBC: 5.9 10*3/uL (ref 4.0–10.5)
nRBC: 11.6 % — ABNORMAL HIGH (ref 0.0–0.2)

## 2018-06-05 LAB — TROPONIN I: Troponin I: <0.03 ng/mL (ref <0.03)

## 2018-06-05 SURGERY — CARDIOVERSION
Anesthesia: General

## 2018-06-05 MED ORDER — PROPOFOL 10 MG/ML IV BOLUS
INTRAVENOUS | Status: DC | PRN
  Administered 2018-06-05: 20 mg via INTRAVENOUS
  Administered 2018-06-05 (×2): 40 mg via INTRAVENOUS

## 2018-06-05 MED ORDER — REGADENOSON 0.4 MG/5ML IV SOLN
0.4000 mg | Freq: Once | INTRAVENOUS | Status: AC
  Administered 2018-06-06: 0.4 mg via INTRAVENOUS
  Filled 2018-06-05 (×3): qty 5

## 2018-06-05 MED ORDER — FUROSEMIDE 10 MG/ML IJ SOLN
40.0000 mg | Freq: Once | INTRAMUSCULAR | Status: AC
  Administered 2018-06-05: 40 mg via INTRAVENOUS
  Filled 2018-06-05: qty 4

## 2018-06-05 MED ORDER — NITROGLYCERIN 0.4 MG SL SUBL
SUBLINGUAL_TABLET | SUBLINGUAL | Status: AC
  Administered 2018-06-05: 0.4 mg
  Filled 2018-06-05: qty 1

## 2018-06-05 MED ORDER — LIDOCAINE 2% (20 MG/ML) 5 ML SYRINGE
INTRAMUSCULAR | Status: DC | PRN
  Administered 2018-06-05: 20 mg via INTRAVENOUS

## 2018-06-05 NOTE — Progress Notes (Signed)
Smoke with HF team. Verbal order of troponin was given . Patients current vitals are as follows:    06/05/18 0824  Vitals  BP 111/64  MAP (mmHg) 79  BP Location Left Arm  BP Method Automatic  Patient Position (if appropriate) Lying  Pulse Rate 79  Pulse Rate Source Monitor  Oxygen Therapy  SpO2 99 %  O2 Device Room Air  Pain Assessment  Pain Scale 0-10  Pain Score 1  Pain Location Chest  Pain Orientation Right   Patient stated " chest pain is gone down to 1, it's better"

## 2018-06-05 NOTE — Transfer of Care (Signed)
Immediate Anesthesia Transfer of Care Note  Patient: Patrick [name]  Procedure(s) Performed: CARDIOVERSION (N/A )  Patient Location: Endoscopy Unit  Anesthesia Type:General  Level of Consciousness: awake, alert  and patient cooperative  Airway & Oxygen Therapy: Patient Spontanous Breathing and Patient connected to nasal cannula oxygen  Post-op Assessment: Report given to RN and Post -op Vital signs reviewed and stable  Post vital signs: Reviewed and stable  Last Vitals:  Vitals Value Taken Time  BP    Temp    Pulse    Resp    SpO2      Last Pain:  Vitals:   06/05/18 1347  TempSrc: Oral  PainSc: 0-No pain         Complications: No apparent anesthesia complications

## 2018-06-05 NOTE — Progress Notes (Addendum)
Advanced Heart Failure Rounding Note  PCP-Cardiologist: No primary care provider on file.   Subjective:   12/16 Received 1UPRBCs. Hgb 6.4> 7.2 . Diuresed with 120 mg IV lasix. Negative 2.5 liters.   Yesterday he started having chest pain when he inhales. Pain was 5/10 . He was given SL NTG and pain improved to 1/10.    Denies SOB  Objective:   Weight Range: 65.4 kg Body mass index is 20.11 kg/m.   Vital Signs:   Temp:  [98 F (36.7 C)-98.5 F (36.9 C)] 98.5 F (36.9 C) (12/19 0500) Pulse Rate:  [67-86] 67 (12/19 0836) Resp:  [18-20] 18 (12/19 0500) BP: (110-125)/(64-86) 121/74 (12/19 0836) SpO2:  [98 %-100 %] 98 % (12/19 0836) Weight:  [65.4 kg] 65.4 kg (12/19 0055) Last BM Date: 06/04/18  Weight change: Filed Weights   06/03/18 0241 06/04/18 0129 06/05/18 0055  Weight: 64.9 kg 65 kg 65.4 kg    Intake/Output:   Intake/Output Summary (Last 24 hours) at 06/05/2018 1032 Last data filed at 06/05/2018 0804 Gross per 24 hour  Intake 1423.92 ml  Output 3325 ml  Net -1901.08 ml      Physical Exam    General: . No resp difficulty HEENT: normal Neck: supple. JVP 9-10. Carotids 2+ bilat; no bruits. No lymphadenopathy or thryomegaly appreciated. Cor: PMI nondisplaced. Irregular rate & rhythm. No rubs, gallops or murmurs. Lungs: clear on 1 liter of oxygen.  Abdomen: soft, nontender, nondistended. No hepatosplenomegaly. No bruits or masses. Good bowel sounds. Extremities: no cyanosis, clubbing, rash, edema Neuro: alert & orientedx3, cranial nerves grossly intact. moves all 4 extremities w/o difficulty. Affect pleasant   Telemetry   A fib/Flutter 70-80s Personally reviewed   EKG    N/a   Labs    CBC Recent Labs    06/03/18 0454 06/04/18 0447 06/05/18 0622  WBC 6.7 7.2 5.9  NEUTROABS 5.0 5.1  --   HGB 7.2* 7.2* 7.4*  HCT 20.7* 20.7* 21.3*  MCV 101.0* 101.5* 100.5*  PLT 123* 142* 235*   Basic Metabolic Panel Recent Labs    06/04/18 0447  06/05/18 0622  NA 137 135  K 3.6 3.6  CL 98 98  CO2 26 25  GLUCOSE 112* 108*  BUN 82* 77*  CREATININE 5.44* 5.27*  CALCIUM 8.4* 8.5*  MG 2.1  --    Liver Function Tests Recent Labs    06/03/18 0454  AST 33  ALT 30  ALKPHOS 56  BILITOT 1.7*  PROT 6.2*  ALBUMIN 3.1*   No results for input(s): LIPASE, AMYLASE in the last 72 hours. Cardiac Enzymes No results for input(s): CKTOTAL, CKMB, CKMBINDEX, TROPONINI in the last 72 hours.  BNP: BNP (last 3 results) Recent Labs    06/02/18 0934  BNP 1,644.3*    ProBNP (last 3 results) No results for input(s): PROBNP in the last 8760 hours.   D-Dimer No results for input(s): DDIMER in the last 72 hours. Hemoglobin A1C No results for input(s): HGBA1C in the last 72 hours. Fasting Lipid Panel No results for input(s): CHOL, HDL, LDLCALC, TRIG, CHOLHDL, LDLDIRECT in the last 72 hours. Thyroid Function Tests No results for input(s): TSH, T4TOTAL, T3FREE, THYROIDAB in the last 72 hours.  Invalid input(s): FREET3  Other results:   Imaging    No results found.   Medications:     Scheduled Medications: . sodium chloride   Intravenous Once  . apixaban  5 mg Oral BID  . aspirin EC  81 mg  Oral Daily  . calcitRIOL  0.5 mcg Oral Daily  . carvedilol  9.375 mg Oral BID WC  . folic acid  1 mg Oral Daily  . hydrALAZINE  25 mg Oral BID  . hydroxyurea  500 mg Oral Daily  . isosorbide mononitrate  30 mg Oral Daily  . pantoprazole  40 mg Oral Daily  . sodium chloride flush  3 mL Intravenous Q12H    Infusions: . sodium chloride    . amiodarone 16.7 mg/hr (06/04/18 1800)    PRN Medications:      Patient Profile   Patrick [name] is a legally blind 62 year old with a history of sickle cell anemia, gout, chronic systolic heart failure, NICM, CKD Stage IV (has RUE AVF) , PAF, GI bleed (coumadin stopped and eliquis started), anemia, and tobacco abuse.  Admitted with symptomatic anemia, a fib, and a/c systolic hf.       Assessment/Plan  1. Symptomatic Anemia, sickle cell anemia    Had  GI bleeding 09/2017. Had EGD/Colonoscpopy that showed gastritis and internal hemorrhoids. Coumadin was stopped and eliquis was started He has been receiving aranesp every other week.   Received 1UPRBC 12/16. Hgb up from 6.4 to 7.2 Hgb stable 7.2. No s/s bleeding.    Continue ppi.   2. A/C Systolic Heart Failure, NICM   ECHO 07/2017 EF 40-45%. ECHO completed and EF has dropped to 25-30%.   Admitted with shortness of breath at rest but this is complicated by anemia and suspect A fib has also worsened HF.  -BNP elevated and CXR with edema.  -Given EF is down will try to restore NSR.  - Volume status up. Give 40 mg IV x1.    - Continue carvedilol 9.375 mg twice a day.   - Continue hydralazine 25 mg tiwce a day + imdur 30 mg daily.  -Consult cardiac rehab.   3. A fib  First went in A fib in 2017 and spontaneously converted.  In April of this year he was in NSR.  He was taken off coumadin in April of this year due to GI bleed and started on eliquis.  Continue eliqius 5 mg twice a day.He has not missed any dose of eliquis.  No overt signs of bleeding.  Rate controlled.  - Remains in A fib/Aflutter. Cardioversion today at 2:30.   4. HTN  Stable.     5. CKD Stage IV Followed by Patrick [name]. Has AVF  But has not started hemodialysis.  Creatinine baseline 5.5.-5.8  Good urine output noted. Creatinine trending down 5.8>5.6 >5.44>5.3  6. Legally Blind   7. Chest Pain EKG with no ST changes. Cycle troponin. ? May be due to demand ischemia.   Medication concerns reviewed with patient and pharmacy team. Barriers identified: none   Length of Stay: 3  Amy [name], NP  06/05/2018, 10:32 AM   Advanced Heart Failure Team Pager [phone] (M-F; Blue)  Please contact [facility] for night-coverage after hours (4p -7a ) and weekends on [email or web address]  Patient seen and examined with the above-signed Advanced Practice  Provider and/or Housestaff. I personally reviewed laboratory data, imaging studies and relevant notes. I independently examined the patient and formulated the important aspects of the plan. I have edited the note to reflect any of my changes or salient points. I have personally discussed the plan with the patient and/or family.  Volume status improving but still elevated. Agree with another dose of IV lasix. Creatinine high but stable. Had some  CP this am but ECG ok. Trop ok. CP resolved. Not candidate for cath with near ESRD. If CP persists will need to plan cath and plan for HD. With drop in EF can consider Myoview in am. Plan DC-CV today.   Glori Bickers, MD  2:41 PM

## 2018-06-05 NOTE — Interval H&P Note (Signed)
History and Physical Interval Note:  06/05/2018 2:25 PM  Patrick [name]  has presented today for surgery, with the diagnosis of a flutter  The various methods of treatment have been discussed with the patient and family. After consideration of risks, benefits and other options for treatment, the patient has consented to  Procedure(s): CARDIOVERSION (N/A) as a surgical intervention .  The patient's history has been reviewed, patient examined, no change in status, stable for surgery.  I have reviewed the patient's chart and labs.  Questions were answered to the patient's satisfaction.     [name]

## 2018-06-05 NOTE — Progress Notes (Signed)
Subjective: Patrick [name], a 62 year old male with a medical history significant for CHF, nonischemic cardiomyopathy, sickle cell anemia, HbSC, end-stage kidney disease, legally blind and tobacco dependent was admitted on 06/02/2018 with symptomatic anemia.   Patient states that he experienced chest pains started late yesterday evening primarily with deep breathing. He characterized as sharp and non radiating. Pain resolved following nitroglycerin. He is resting comfortably and says that he feels well.   Patient denies dizziness, shortness of breath, chest pain, dysuria, nausea, vomiting, diarrhea or constipation.   Objective:  Vital signs in last 24 hours:  Vitals:   06/05/18 0500 06/05/18 0812 06/05/18 0824 06/05/18 0836  BP: 115/71 121/71 111/64 121/74  Pulse: 75 81 79 67  Resp: 18     Temp: 98.5 F (36.9 C)     TempSrc: Oral     SpO2: 99% 100% 99% 98%  Weight:      Height:        Intake/Output from previous day:   Intake/Output Summary (Last 24 hours) at 06/05/2018 1135 Last data filed at 06/05/2018 0804 Gross per 24 hour  Intake 1243.92 ml  Output 3325 ml  Net -2081.08 ml    Physical Exam: General: Alert, awake, oriented x3, in no acute distress.  HEENT: Bud/AT PEERL, EOMI Neck: Trachea midline,  no masses, no thyromegal,y no JVD, no carotid bruit OROPHARYNX:  Moist, No exudate/ erythema/lesions.  Heart: Regular rate and rhythm, without murmurs, rubs, gallops, PMI non-displaced, no heaves or thrills on palpation.  Lungs: Clear to auscultation, no wheezing or rhonchi noted. No increased vocal fremitus resonant to percussion  Abdomen: Soft, nontender, positive bowel sounds, no masses no hepatosplenomegaly noted..  Neuro: No focal neurological deficits noted cranial nerves II through XII grossly intact. DTRs 2+ bilaterally upper and lower extremities. Strength 5 out of 5 in bilateral upper and lower extremities. Musculoskeletal: No warm swelling or erythema around  joints, no spinal tenderness noted. Psychiatric: Patient alert and oriented x3, good insight and cognition, good recent to remote recall. Lymph node survey: No cervical axillary or inguinal lymphadenopathy noted.  Lab Results:  Basic Metabolic Panel:    Component Value Date/Time   NA 135 06/05/2018 0622   NA 139 01/27/2018 1529   K 3.6 06/05/2018 0622   CL 98 06/05/2018 0622   CO2 25 06/05/2018 0622   BUN 77 (H) 06/05/2018 0622   BUN 99 (HH) 01/27/2018 1529   CREATININE 5.27 (H) 06/05/2018 0622   CREATININE 2.90 (H) 08/11/2015 1359   GLUCOSE 108 (H) 06/05/2018 0622   CALCIUM 8.5 (L) 06/05/2018 0622   CBC:    Component Value Date/Time   WBC 5.9 06/05/2018 0622   HGB 7.4 (L) 06/05/2018 0622   HGB 7.3 (L) 01/27/2018 1529   HCT 21.3 (L) 06/05/2018 0622   HCT 21.0 (L) 01/27/2018 1529   PLT 147 (L) 06/05/2018 0622   PLT 180 01/27/2018 1529   MCV 100.5 (H) 06/05/2018 0622   MCV 115 (H) 01/27/2018 1529   NEUTROABS 5.1 06/04/2018 0447   NEUTROABS 3.8 01/27/2018 1529   LYMPHSABS 0.6 (L) 06/04/2018 0447   LYMPHSABS 1.4 01/27/2018 1529   MONOABS 1.1 (H) 06/04/2018 0447   EOSABS 0.2 06/04/2018 0447   EOSABS 0.3 01/27/2018 1529   BASOSABS 0.1 06/04/2018 0447   BASOSABS 0.1 01/27/2018 1529    Recent Results (from the past 240 hour(s))  Urine culture     Status: None   Collection Time: 06/02/18  5:09 PM  Result Value Ref Range  Status   Specimen Description URINE, RANDOM  Final   Special Requests NONE  Final   Culture   Final    NO GROWTH Performed at [facility], [address] [place], [place] [address]    Report Status 06/04/2018 FINAL  Final    Studies/Results: No results found.  Medications: Scheduled Meds: . sodium chloride   Intravenous Once  . apixaban  5 mg Oral BID  . aspirin EC  81 mg Oral Daily  . calcitRIOL  0.5 mcg Oral Daily  . carvedilol  9.375 mg Oral BID WC  . folic acid  1 mg Oral Daily  . furosemide  40 mg Intravenous Once  . hydrALAZINE   25 mg Oral BID  . hydroxyurea  500 mg Oral Daily  . isosorbide mononitrate  30 mg Oral Daily  . pantoprazole  40 mg Oral Daily  . sodium chloride flush  3 mL Intravenous Q12H   Continuous Infusions: . sodium chloride    . amiodarone 16.7 mg/hr (06/04/18 1800)   PRN Meds:.  Consultants:  Cardiology  Procedures:  Transfused 1 unit of PRBCs on 06/02/2018  Assessment/Plan: Active Problems:   HTN (hypertension)   Acute on chronic systolic and diastolic heart failure, NYHA class 4 (HCC)   Tobacco abuse   Sickle cell anemia (HCC)   CHF (congestive heart failure) (HCC)   Atrial fibrillation (HCC)   Symptomatic anemia   Hypoxia  Anemia of chronic disease, symptomatic anemia:  Patient is s/p 1 unit of PRBCs on 06/02/2018. Restarted Aranesp 200 mcg on 06/03/2018. Patient receives every other week.  Hemoglobin stable at 7.4, continue to follow CBC  Chronic kidney disease stage IV:  Followed by Dr. Lorrene Reid. Patient has right arm fistula. Patient has not started hemodialysis. Creatinine level trended down to 5.9<5.6<5.4<5.2, which is consistent with baseline.  Consulted nephrology, restarted Aranesp.   Acute on chronic systolic heart failure.  Patient was admitted with shortness of breath BNP elevated on 06/02/2018 and edema on CXR.  Torsemide 100 mg daily restarted, which is home dose of diuretics Cardiac rehab consulted per cardiology  Pulmonary edema: Evidence of fluid overload.  . Continue to provide oxygen support.  Ambulating pulse oximetry.   Sickle cell anemia without crisis:  Patient denies pain. Continue home medications  Hypertension: Stable. Continue home medications   Atrial fibrillation:  History of atrial fibrillation. Patient was switched from coumadin to Eliquis in April 2019. ECHO shows that EF has dropped to 25-30% Eliquis continued Cardioversion scheduled for today at 2:30   Code Status: Full Code Family Communication: N/A Disposition Plan: Not  yet ready for discharge  [place], MSN, FNP-C Patient [place] [place], [place] [id number] [phone]  If 5PM-7AM, please contact night-coverage.  06/05/2018, 11:35 AM  LOS: 3 days

## 2018-06-05 NOTE — Anesthesia Preprocedure Evaluation (Signed)
Anesthesia Evaluation  Patient identified by MRN, date of birth, ID band Patient awake    Reviewed: Allergy & Precautions, H&P , NPO status , Patient's Chart, lab work & pertinent test results  Airway Mallampati: II   Neck ROM: full    Dental   Pulmonary shortness of breath, former smoker,    breath sounds clear to auscultation       Cardiovascular hypertension, +CHF  + dysrhythmias  Rhythm:regular Rate:Normal  TTE: EF 40-45%   Neuro/Psych  Headaches,    GI/Hepatic GI bleed   Endo/Other    Renal/GU Renal InsufficiencyRenal disease     Musculoskeletal   Abdominal   Peds  Hematology  (+) Blood dyscrasia, Sickle cell anemia and anemia ,   Anesthesia Other Findings   Reproductive/Obstetrics                             Anesthesia Physical  Anesthesia Plan  ASA: III  Anesthesia Plan: General   Post-op Pain Management:    Induction: Intravenous  PONV Risk Score and Plan: 2 and Treatment may vary due to age or medical condition  Airway Management Planned: Mask  Additional Equipment:   Intra-op Plan:   Post-operative Plan:   Informed Consent: I have reviewed the patients History and Physical, chart, labs and discussed the procedure including the risks, benefits and alternatives for the proposed anesthesia with the patient or authorized representative who has indicated his/her understanding and acceptance.   Dental advisory given  Plan Discussed with: CRNA and Anesthesiologist  Anesthesia Plan Comments:         Anesthesia Quick Evaluation

## 2018-06-05 NOTE — Plan of Care (Signed)
  Problem: Education: Goal: Knowledge of General Education information will improve Description Including pain rating scale, medication(s)/side effects and non-pharmacologic comfort measures Outcome: Progressing   Problem: Health Behavior/Discharge Planning: Goal: Ability to manage health-related needs will improve Outcome: Progressing   Problem: Clinical Measurements: Goal: Ability to maintain clinical measurements within normal limits will improve Outcome: Progressing Goal: Will remain free from infection Outcome: Progressing Goal: Diagnostic test results will improve Outcome: Progressing Goal: Respiratory complications will improve Outcome: Progressing Goal: Cardiovascular complication will be avoided Outcome: Progressing   Problem: Coping: Goal: Level of anxiety will decrease Outcome: Progressing   Problem: Elimination: Goal: Will not experience complications related to bowel motility Outcome: Progressing Goal: Will not experience complications related to urinary retention Outcome: Progressing   Problem: Pain Managment: Goal: General experience of comfort will improve Outcome: Progressing   Problem: Safety: Goal: Ability to remain free from injury will improve Outcome: Progressing   Problem: Education: Goal: Ability to demonstrate management of disease process will improve Outcome: Progressing Goal: Ability to verbalize understanding of medication therapies will improve Outcome: Progressing   Problem: Activity: Goal: Capacity to carry out activities will improve Outcome: Progressing   Problem: Cardiac: Goal: Ability to achieve and maintain adequate cardiopulmonary perfusion will improve Outcome: Progressing

## 2018-06-05 NOTE — Progress Notes (Signed)
Pt. Requesting pain medication for pain in chest with inspiration. On call for IM paged to make aware.

## 2018-06-05 NOTE — Anesthesia Postprocedure Evaluation (Signed)
Anesthesia Post Note  Patient: Patrick [name]  Procedure(s) Performed: CARDIOVERSION (N/A )     Patient location during evaluation: PACU Anesthesia Type: General Level of consciousness: awake and alert Pain management: pain level controlled Vital Signs Assessment: post-procedure vital signs reviewed and stable Respiratory status: spontaneous breathing, nonlabored ventilation, respiratory function stable and patient connected to nasal cannula oxygen Cardiovascular status: blood pressure returned to baseline and stable Postop Assessment: no apparent nausea or vomiting Anesthetic complications: no    Last Vitals:  Vitals:   06/05/18 1440 06/05/18 1450  BP: 115/67 110/68  Pulse: 70 68  Resp: 13 13  Temp:    SpO2:  100%    Last Pain:  Vitals:   06/05/18 1450  TempSrc:   PainSc: 0-No pain                 Tiajuana Amass

## 2018-06-05 NOTE — Progress Notes (Signed)
Patient reported chest pain with inspiration. EKG was done and in pts chart. Showed atrial flutter with variable AV block.  Patient stated : "is getting worse, but I have been having this pain this couple of days."

## 2018-06-05 NOTE — CV Procedure (Signed)
    DIRECT CURRENT CARDIOVERSION  NAME:  Patrick [name]   MRN: [id number] DOB:  1955/11/04   ADMIT DATE: 06/02/2018   INDICATIONS: Atrial fibrillation    PROCEDURE:   Informed consent was obtained prior to the procedure. The risks, benefits and alternatives for the procedure were discussed and the patient comprehended these risks. Once an appropriate time out was taken, the patient had the defibrillator pads placed in the anterior and posterior position. The patient then underwent sedation by the anesthesia service. Once an appropriate level of sedation was achieved, the patient received a biphasic, synchronized 200J shock with prompt conversion to sinus rhythm. He then had recurrent AF and received a second shock with conversion to NSR.  No apparent complications.  Glori Bickers, MD  2:42 PM

## 2018-06-05 NOTE — Progress Notes (Addendum)
Patient states a pain level of 0.   Vitals are as follows:   06/05/18 0836  Vitals  BP 121/74  MAP (mmHg) 88  BP Location Left Arm  BP Method Automatic  Patient Position (if appropriate) Lying  Pulse Rate 67  Pulse Rate Source Monitor  Oxygen Therapy  SpO2 98 %  O2 Device Room Air  Pain Assessment  Pain Scale 0-10  Pain Score 0  Pain Location Chest  Pain Orientation Right   Troponin labs ordered per verbal order.

## 2018-06-05 NOTE — Care Management Important Message (Signed)
Important Message  Patient Details  Name: Patrick [name] MRN: [id number] Date of Birth: 1956-05-18   Medicare Important Message Given:  Yes    [name] 06/05/2018, 11:33 AM

## 2018-06-05 NOTE — Progress Notes (Signed)
Patient states pain level of 4 on chest pain with inspiration.  nitroGLYCERIN 0.4 mg SL tab given.    06/05/18 0812  Vitals  BP 121/71  MAP (mmHg) 87  BP Location Left Arm  BP Method Automatic  Patient Position (if appropriate) Lying  Pulse Rate 81  Pulse Rate Source Monitor  Oxygen Therapy  SpO2 100 %  O2 Device Room Air  Pain Assessment  Pain Scale 0-10  Pain Score 4  Pain Location Chest  Pain Orientation Right  Pain Descriptors / Indicators Sharp  Pain Intervention(s) Medication (See eMAR)   Patient is currently laying down in bed with eyes close. Amiodarone 30mg /hr currently running at 16.67 ml/hr. HF team paged.

## 2018-06-06 ENCOUNTER — Encounter: Payer: Self-pay | Admitting: Family Medicine

## 2018-06-06 ENCOUNTER — Inpatient Hospital Stay (HOSPITAL_COMMUNITY): Payer: PPO

## 2018-06-06 DIAGNOSIS — I5022 Chronic systolic (congestive) heart failure: Secondary | ICD-10-CM

## 2018-06-06 DIAGNOSIS — Z720 Tobacco use: Secondary | ICD-10-CM

## 2018-06-06 DIAGNOSIS — D649 Anemia, unspecified: Secondary | ICD-10-CM

## 2018-06-06 DIAGNOSIS — R079 Chest pain, unspecified: Secondary | ICD-10-CM

## 2018-06-06 LAB — TYPE AND SCREEN
ABO/RH(D): AB POS
Antibody Screen: NEGATIVE
Unit division: 0
Unit division: 0
Unit division: 0

## 2018-06-06 LAB — CBC WITH DIFFERENTIAL/PLATELET
Abs Immature Granulocytes: 0.08 10*3/uL — ABNORMAL HIGH (ref 0.00–0.07)
Basophils Absolute: 0 10*3/uL (ref 0.0–0.1)
Basophils Relative: 0 %
Eosinophils Absolute: 0.2 10*3/uL (ref 0.0–0.5)
Eosinophils Relative: 2 %
HCT: 20.7 % — ABNORMAL LOW (ref 39.0–52.0)
Hemoglobin: 7.2 g/dL — ABNORMAL LOW (ref 13.0–17.0)
Immature Granulocytes: 1 %
Lymphocytes Relative: 7 %
Lymphs Abs: 0.4 10*3/uL — ABNORMAL LOW (ref 0.7–4.0)
MCH: 35.1 pg — ABNORMAL HIGH (ref 26.0–34.0)
MCHC: 34.8 g/dL (ref 30.0–36.0)
MCV: 101 fL — ABNORMAL HIGH (ref 80.0–100.0)
Monocytes Absolute: 1 10*3/uL (ref 0.1–1.0)
Monocytes Relative: 17 %
Neutro Abs: 4.6 10*3/uL (ref 1.7–7.7)
Neutrophils Relative %: 73 %
Platelets: 149 10*3/uL — ABNORMAL LOW (ref 150–400)
RBC: 2.05 MIL/uL — ABNORMAL LOW (ref 4.22–5.81)
RDW: 19.2 % — ABNORMAL HIGH (ref 11.5–15.5)
WBC: 6.2 10*3/uL (ref 4.0–10.5)
nRBC: 10.8 % — ABNORMAL HIGH (ref 0.0–0.2)

## 2018-06-06 LAB — NM MYOCAR MULTI W/SPECT W/WALL MOTION / EF
Peak HR: 86 {beats}/min
Rest HR: 72 {beats}/min

## 2018-06-06 LAB — RENAL FUNCTION PANEL
Albumin: 3 g/dL — ABNORMAL LOW (ref 3.5–5.0)
Anion gap: 13 (ref 5–15)
BUN: 76 mg/dL — ABNORMAL HIGH (ref 6–20)
CO2: 23 mmol/L (ref 22–32)
Calcium: 8.7 mg/dL — ABNORMAL LOW (ref 8.9–10.3)
Chloride: 101 mmol/L (ref 98–111)
Creatinine, Ser: 5.39 mg/dL — ABNORMAL HIGH (ref 0.61–1.24)
GFR calc Af Amer: 12 mL/min — ABNORMAL LOW (ref >60)
GFR calc non Af Amer: 11 mL/min — ABNORMAL LOW (ref >60)
Glucose, Bld: 108 mg/dL — ABNORMAL HIGH (ref 70–99)
Phosphorus: 5.8 mg/dL — ABNORMAL HIGH (ref 2.5–4.6)
Potassium: 3.8 mmol/L (ref 3.5–5.1)
Sodium: 137 mmol/L (ref 135–145)

## 2018-06-06 LAB — RETICULOCYTES
Immature Retic Fract: 26.1 % — ABNORMAL HIGH (ref 2.3–15.9)
RBC.: 2.05 MIL/uL — ABNORMAL LOW (ref 4.22–5.81)
Retic Count, Absolute: 172.6 10*3/uL (ref 19.0–186.0)
Retic Ct Pct: 8.1 % — ABNORMAL HIGH (ref 0.4–3.1)

## 2018-06-06 LAB — BPAM RBC
Blood Product Expiration Date: 201912252359
Blood Product Expiration Date: 201912312359
Blood Product Expiration Date: 202001122359
ISSUE DATE / TIME: 201912161356
Unit Type and Rh: 5100
Unit Type and Rh: 5100
Unit Type and Rh: 6200

## 2018-06-06 MED ORDER — AMIODARONE HCL 200 MG PO TABS
200.0000 mg | ORAL_TABLET | Freq: Two times a day (BID) | ORAL | Status: DC
  Administered 2018-06-06: 200 mg via ORAL
  Filled 2018-06-06: qty 1

## 2018-06-06 MED ORDER — REGADENOSON 0.4 MG/5ML IV SOLN
INTRAVENOUS | Status: AC
  Filled 2018-06-06: qty 5

## 2018-06-06 MED ORDER — CARVEDILOL 12.5 MG PO TABS
12.5000 mg | ORAL_TABLET | Freq: Two times a day (BID) | ORAL | Status: DC
  Administered 2018-06-06: 12.5 mg via ORAL
  Filled 2018-06-06: qty 1

## 2018-06-06 MED ORDER — TECHNETIUM TC 99M TETROFOSMIN IV KIT
10.0000 | PACK | Freq: Once | INTRAVENOUS | Status: AC | PRN
  Administered 2018-06-06: 10 via INTRAVENOUS

## 2018-06-06 MED ORDER — TECHNETIUM TC 99M TETROFOSMIN IV KIT
30.0000 | PACK | Freq: Once | INTRAVENOUS | Status: AC | PRN
  Administered 2018-06-06: 30 via INTRAVENOUS

## 2018-06-06 NOTE — Progress Notes (Addendum)
Advanced Heart Failure Rounding Note  PCP-Cardiologist: No primary care provider on file.   Subjective:   12/16 Received 1UPRBCs. Hgb 6.4> 7.2 .  Yesterday he underwent successful S/P DC-CV. Remains on amio drip. He was also diuresed with IV lasix.   Denies SOB. Frustrated and wants to go home.   Objective:   Weight Range: 65.3 kg Body mass index is 20.08 kg/m.   Vital Signs:   Temp:  [97.9 F (36.6 C)-98.4 F (36.9 C)] 98.3 F (36.8 C) (12/20 0453) Pulse Rate:  [67-81] 76 (12/20 0453) Resp:  [13-18] 18 (12/20 0453) BP: (109-138)/(64-81) 117/71 (12/20 0453) SpO2:  [97 %-100 %] 97 % (12/20 0453) Weight:  [65.3 kg] 65.3 kg (12/20 0453) Last BM Date: 06/05/18  Weight change: Filed Weights   06/04/18 0129 06/05/18 0055 06/06/18 0453  Weight: 65 kg 65.4 kg 65.3 kg    Intake/Output:   Intake/Output Summary (Last 24 hours) at 06/06/2018 0812 Last data filed at 06/06/2018 0558 Gross per 24 hour  Intake 416 ml  Output 1600 ml  Net -1184 ml      Physical Exam    General:  In bed.  No resp difficulty HEENT: normal Neck: supple. JVP 9-10 . Carotids 2+ bilat; no bruits. No lymphadenopathy or thryomegaly appreciated. Cor: PMI nondisplaced. Regular rate & rhythm. No rubs, gallops or murmurs. Lungs: clear Abdomen: soft, nontender, nondistended. No hepatosplenomegaly. No bruits or masses. Good bowel sounds. Extremities: no cyanosis, clubbing, rash, edema Neuro: alert & orientedx3, cranial nerves grossly intact. moves all 4 extremities w/o difficulty. Affect pleasant   Telemetry  NSR 70s personally reviewed.    EKG    N/a   Labs    CBC Recent Labs    06/04/18 0447 06/05/18 0622 06/06/18 0554  WBC 7.2 5.9 6.2  NEUTROABS 5.1  --  4.6  HGB 7.2* 7.4* 7.2*  HCT 20.7* 21.3* 20.7*  MCV 101.5* 100.5* 101.0*  PLT 142* 147* 323*   Basic Metabolic Panel Recent Labs    06/04/18 0447 06/05/18 0622 06/06/18 0554  NA 137 135 137  K 3.6 3.6 3.8  CL 98 98  101  CO2 26 25 23   GLUCOSE 112* 108* 108*  BUN 82* 77* 76*  CREATININE 5.44* 5.27* 5.39*  CALCIUM 8.4* 8.5* 8.7*  MG 2.1  --   --   PHOS  --   --  5.8*   Liver Function Tests Recent Labs    06/06/18 0554  ALBUMIN 3.0*   No results for input(s): LIPASE, AMYLASE in the last 72 hours. Cardiac Enzymes Recent Labs    06/05/18 0934  TROPONINI <0.03    BNP: BNP (last 3 results) Recent Labs    06/02/18 0934  BNP 1,644.3*    ProBNP (last 3 results) No results for input(s): PROBNP in the last 8760 hours.   D-Dimer No results for input(s): DDIMER in the last 72 hours. Hemoglobin A1C No results for input(s): HGBA1C in the last 72 hours. Fasting Lipid Panel No results for input(s): CHOL, HDL, LDLCALC, TRIG, CHOLHDL, LDLDIRECT in the last 72 hours. Thyroid Function Tests No results for input(s): TSH, T4TOTAL, T3FREE, THYROIDAB in the last 72 hours.  Invalid input(s): FREET3  Other results:   Imaging    No results found.   Medications:     Scheduled Medications: . sodium chloride   Intravenous Once  . apixaban  5 mg Oral BID  . aspirin EC  81 mg Oral Daily  . calcitRIOL  0.5  mcg Oral Daily  . carvedilol  9.375 mg Oral BID WC  . folic acid  1 mg Oral Daily  . hydrALAZINE  25 mg Oral BID  . hydroxyurea  500 mg Oral Daily  . isosorbide mononitrate  30 mg Oral Daily  . pantoprazole  40 mg Oral Daily  . regadenoson  0.4 mg Intravenous Once  . sodium chloride flush  3 mL Intravenous Q12H    Infusions: . sodium chloride Stopped (06/05/18 1445)  . amiodarone 30 mg/hr (06/06/18 0143)    PRN Medications:      Patient Profile   Mr Mauss is a legally blind 62 year old with a history of sickle cell anemia, gout, chronic systolic heart failure, NICM, CKD Stage IV (has RUE AVF) , PAF, GI bleed (coumadin stopped and eliquis started), anemia, and tobacco abuse.  Admitted with symptomatic anemia, a fib, and a/c systolic hf.      Assessment/Plan  1.  Symptomatic Anemia, sickle cell anemia    Had  GI bleeding 09/2017. Had EGD/Colonoscpopy that showed gastritis and internal hemorrhoids. Coumadin was stopped and eliquis was started He has been receiving aranesp every other week.   Received 1UPRBC 12/16. Hgb up from 6.4 to 7.2 Hgb remains stable at 7.2  Continue ppi.   2. A/C Systolic Heart Failure, NICM   ECHO 07/2017 EF 40-45%. ECHO completed and EF has dropped to 25-30%.   Admitted with shortness of breath at rest but this is complicated by anemia and suspect A fib has also worsened HF.   Ef was down on admit so he underwent DC-CV.  -Volume status mildly elevated. Restart torsemide 100 mg daily.  - Increase carvedilol 12.5 twice a day. This was his home dose.    - Continue hydralazine 25 mg tiwce a day + imdur 30 mg daily.  -Consult cardiac rehab.   3. A fib  First went in A fib in 2017 and spontaneously converted.  In April of this year he was in NSR.  He was taken off coumadin in April of this year due to GI bleed and started on eliquis.  S/P DC-CV 12/19. Maintaining NSR . Stop amiodarone drip. Start amiodarone 200 mg twice a day.  Continue eliqius 5 mg twice a day.   4. HTN  Stable.     5. CKD Stage IV Followed by [facility]. Has AVF  But has not started hemodialysis.  Creatinine baseline 5.5.-5.8  - Creatinine stable 5.4   6. Legally Blind   7. Chest Pain EKG with no ST changes. Troponin was < 0.03. Myoview today. Last LHC was in 2012.    Possible d/c later today if myoview ok.   Medication concerns reviewed with patient and pharmacy team. Barriers identified: none   Length of Stay: 4  Amy [name], NP  06/06/2018, 8:12 AM   Advanced Heart Failure Team Pager [phone] (M-F; 7a - 4p)  Please contact [facility] for night-coverage after hours (4p -7a ) and weekends on [email or web address]  Patient seen and examined with the above-signed Advanced Practice Provider and/or Housestaff. I personally reviewed laboratory data,  imaging studies and relevant notes. I independently examined the patient and formulated the important aspects of the plan. I have edited the note to reflect any of my changes or salient points. I have personally discussed the plan with the patient and/or family.  Doing well. Remains in NSR after DC-CV. Breathing better. On Eliquis with no bleeding. Creatinine stable. No further CP Trop negative. Await results of  Myoview. Likely home later today. HF medicines as above. Reinforced need for daily weights and reviewed use of sliding scale diuretics.    Glori Bickers, MD  1:43 PM   Addendum:  Myoview reviewed personally. No significant ischemia. EF 46%. [place] for d/c from our perspective.   Glori Bickers, MD  3:59 PM

## 2018-06-06 NOTE — Progress Notes (Signed)
CARDIAC REHAB PHASE I   Pt out of room for procedure.  Rufina Falco, RN BSN 06/06/2018 1:31PM

## 2018-06-06 NOTE — Discharge Instructions (Signed)
Ambulatory Pulmonary Artery Pressure Monitoring  Ambulatory pulmonary artery pressure monitoring is a way to help your health care provider treat your heart failure. It involves having a procedure to place (implant) a permanent pressure sensor inside your pulmonary artery. Your pulmonary artery carries blood from the right side of your heart to your lungs. Increased pressure in this artery is an early sign that your heart failure has gotten worse. You may need ambulatory pulmonary artery pressure monitoring if you have shortness of breath after activity and have been hospitalized for heart failure in the past year. The sensor sends pressure readings to your health care provider every day. Your health care provider will use these readings to adjust your medicines or change your treatment plan as needed. This monitoring may improve your treatment and reduce the need to be hospitalized. Tell a health care provider about:  Any allergies you have.  All medicines you are taking, including vitamins, herbs, eye drops, creams, and over-the-counter medicines.  Any problems you or family members have had with anesthetic medicines.  Any blood disorders you have.  Any surgeries you have.  Any medical conditions you have.  Whether you are pregnant or may be pregnant. What are the risks? Generally, this is a safe procedure and treatment. However, problems may occur, including:  Infection.  Bleeding.  Allergic reactions to medicines or dyes.  Damage to other structures or organs.  Abnormal heart rhythm.  Problems with the sensor or movement of the sensor.  A blood clot that forms in a vein and travels to your lungs or brain. What happens before the procedure?  Ask your health care provider about: ? Changing or stopping your regular medicines. This is especially important if you are taking diabetes medicines or blood thinners. ? Taking medicines such as aspirin and ibuprofen. These medicines  can thin your blood. Check with your health care provider to see if you should take them. ? Taking over-the-counter medicines, vitamins, herbs, and supplements.  Follow instructions from your health care provider about eating or drinking restrictions.  Do not use any products that contain nicotine or tobacco, such as cigarettes and e-cigarettes. If you need help quitting, ask your health care provider.  Ask your health care provider what steps will be taken to help prevent infection. These may include: ? Removing hair at the procedure site. ? Washing skin with a germ-killing soap.  Plan to have someone take you home from the hospital or clinic. What happens during the procedure?   An IV will be inserted into one of your veins.  You will be given: ? A medicine to help you relax (sedative). ? A medicine to numb an area in your groin or neck (local anesthetic).  A small incision will be made over a blood vessel that leads back to your heart (vein).  A long, flexible tube (catheter) with the sensor attached at the end will be inserted into the vein. Next, it will be advanced into the right side of your heart and then to the pulmonary artery.  Imaging studies will be done to check the position of the sensor.  The sensor will be attached inside your pulmonary artery.  The catheter will be removed, and pressure will be placed over the incision to prevent bleeding and bruising.  A dressing (bandage) will be placed over the incision. The procedure may vary among health care providers and hospitals. What can I expect after the procedure?  Your blood pressure, heart rate, breathing rate, and  blood oxygen level will be monitored until you leave the hospital or clinic.  You will also be monitored to make sure you do not have any abnormal heart rhythms. Follow these instructions at home: Sensor information  Follow instructions from your health care provider about sending information from  the sensor. You may be instructed to send stored information about your pulmonary artery pressure every night using your patient's electronic system. ? The electronic system is stored inside a padded pillow. Each night when you lie down to sleep, you will lie on the pillow. The electronic system will get information from the sensor and send it to your health care provider. ? If you need a change in your medicine, your health care provider's office will contact you.  If you need to have an MRI, be sure to tell your health care providers that you have an ambulatory pulmonary artery sensor.  Your sensor will not set off metal detectors at airports or stores. Incision care  Follow instructions from your health care provider about how to take care of your incision. Make sure you: ? Wash your hands with soap and water before you change your bandage (dressing). If soap and water are not available, use hand sanitizer. ? Change your dressing as told by your health care provider.  Do not take baths, swim, or use a hot tub until your health care provider approves. Ask your health care provider if you may take showers.  Check your incision area every day for signs of infection. Check for: ? Redness, swelling, or pain. ? Fluid or blood. ? Warmth. ? Pus or a bad smell. General instructions  Take over-the-counter and prescription medicines only as told by your health care provider.  Return to your normal activities as told by your health care provider. Ask your health care provider what activities are safe for you.  Do not use any products that contain nicotine or tobacco, such as cigarettes and e-cigarettes. If you need help quitting, ask your health care provider.  Keep all follow-up visits as told by your health care provider. This is important. Contact a health care provider if:  You have: ? A fever or chills. ? Pain near your incision. ? Any bleeding or signs of infection.  You  feel: ? Like your heart is beating too fast or skipping beats. ? Short of breath or dizzy. Get help right away if:  You have chest pain or difficulty breathing. Summary  Ambulatory pulmonary artery pressure monitoring is a way to help your health care provider treat your heart failure. It involves having a procedure to place (implant) a permanent pressure sensor inside your pulmonary artery.  To place the sensor in your artery, your health care provider will use a catheter that moves the sensor through a vein that goes to your heart.  This monitoring may improve your treatment and reduce the need to be hospitalized for your heart condition.  You will learn how to use an electronic device to send pressure readings to your health care provider. This information is not intended to replace advice given to you by your health care provider. Make sure you discuss any questions you have with your health care provider. Document Released: 09/17/2017 Document Revised: 09/17/2017 Document Reviewed: 09/17/2017 Elsevier Interactive Patient Education  2019 [place].   Blood Transfusion, Adult, Care After This sheet gives you information about how to care for yourself after your procedure. Your doctor may also give you more specific instructions. If you  have problems or questions, contact your doctor. Follow these instructions at home:   Take over-the-counter and prescription medicines only as told by your doctor.  Go back to your normal activities as told by your doctor.  Follow instructions from your doctor about how to take care of the area where an IV tube was put into your vein (insertion site). Make sure you: ? Wash your hands with soap and water before you change your bandage (dressing). If there is no soap and water, use hand sanitizer. ? Change your bandage as told by your doctor.  Check your IV insertion site every day for signs of infection. Check for: ? More redness, swelling, or  pain. ? More fluid or blood. ? Warmth. ? Pus or a bad smell. Contact a doctor if:  You have more redness, swelling, or pain around the IV insertion site.  You have more fluid or blood coming from the IV insertion site.  Your IV insertion site feels warm to the touch.  You have pus or a bad smell coming from the IV insertion site.  Your pee (urine) turns pink, red, or brown.  You feel weak after doing your normal activities. Get help right away if:  You have signs of a serious allergic or body defense (immune) system reaction, including: ? Itchiness. ? Hives. ? Trouble breathing. ? Anxiety. ? Pain in your chest or lower back. ? Fever, flushing, and chills. ? Fast pulse. ? Rash. ? Watery poop (diarrhea). ? Throwing up (vomiting). ? Dark pee. ? Serious headache. ? Dizziness. ? Stiff neck. ? Yellow color in your face or the white parts of your eyes (jaundice). Summary  After a blood transfusion, return to your normal activities as told by your doctor.  Every day, check for signs of infection where the IV tube was put into your vein.  Some signs of infection are warm skin, more redness and pain, more fluid or blood, and pus or a bad smell where the needle went in.  Contact your doctor if you feel weak or have any unusual symptoms. This information is not intended to replace advice given to you by your health care provider. Make sure you discuss any questions you have with your health care provider. Document Released: 06/25/2014 Document Revised: 01/27/2016 Document Reviewed: 01/27/2016 Elsevier Interactive Patient Education  [employer].

## 2018-06-06 NOTE — Progress Notes (Signed)
    Patient presented for Lexiscan nuclear stress test. Tolerated procedure well. Pending final stress imaging result.  Daune Perch, AGNP-C 06/06/2018  1:38 PM Pager: [phone]

## 2018-06-06 NOTE — Progress Notes (Signed)
Patient was discharge home. IV peripheral removed,clean dry and intact pressure and dressing applied. Medication education was given with teach back.  Patients questions and concerns were answered. Patient denies chest pain and shortness of breath.  Patients belongings with patient: shoes, phone, clothing and headphones with patient.  Patient's friend and bedside to walk with patient out to car. CCMD called.

## 2018-06-06 NOTE — Discharge Summary (Signed)
Physician Discharge Summary  FREDY GLADU QPR:[id number] DOB: [birth year] DOA: 06/02/2018  PCP: [name], MD  Admit date: 06/02/2018  Discharge date: 06/06/2018  Discharge Diagnoses:  Active Problems:   HTN (hypertension)   Acute on chronic systolic and diastolic heart failure, NYHA class 4 (HCC)   Tobacco abuse   Sickle cell anemia (HCC)   CHF (congestive heart failure) (HCC)   Atrial fibrillation (HCC)   Symptomatic anemia   Hypoxia   Discharge Condition: Stable  Disposition:  Follow-up Information    [facility] Follow up on 06/17/2018.   Specialty:  Cardiology Why:  at 11:00 am, parking code [id number]. please remember to bring all medications Contact information: [address] [id number] [place]       [name], MD Follow up in 2 week(s).   Specialty:  Internal Medicine Contact information: [place] [address] [phone]          Pt is discharged home in good condition and is to follow up with [name], MD this week to have labs evaluated. Jernard D Amsler is instructed to increase activity slowly and balance with rest for the next few days, and use prescribed medication to complete treatment of pain  Diet: Regular Wt Readings from Last 3 Encounters:  06/06/18 65.3 kg  04/30/18 70.3 kg  03/14/18 68 kg    History of present illness:  Cristian Vineyard  is a 62 y.o.  legally blind male medical history significant for CHF, nonischemic cardiomyopathy, sickle cell anemia, end-stage chronic kidney disease, and tobacco abuse who was admitted today with symptomatic anemia and worsening dyspnea patient is followed by CHF clinic.  It appears the patient has a long history of LV systolic dysfunction.  Patient is well-known to me from previous care as an opiate nave patient with a history of sickle cell anemia,  hemoglobin [place].  Patient presented to the emergency department with difficulty breathing over the past 3 to 4 days, which is progressively worsened.  Patient also noticed that abdomen was "bloated" and he was becoming dizzy when attempting to tie shoes. Patient also endorses increased shortness of breath with exertion and fatigue.  He denies fever, chills, nausea, vomiting, diarrhea, or constipation. .   ER Course:  Oxygen saturation 88% on RA, hemoglobin 6.4, which is below baseline of 7.0-8.0, platelets 128. Creatinine is 5.91.  BNP 1644.3. Chest xray shows,  Mild interstitial edema likely secondary to CHF. No alveolar pneumonia nor pleural effusion.  Notified to admit for symptomatic anemia and possible CHF exacerbation.  Hospital Course: Symptomatic anemia:  Patient presented with hypoxia and hemoglobin of 6.4, which was below baseline of 7-8. He was transfused 1 unit of PRBCs during admission. Also, patient was previously treated with Aranesp injections every other week per nephrology. Contacted nephrology. Prior to admission, his last injection was on 04/29/2018. Patient missed last 2 injections due to work constraints. Patient received Aranesp 200 mcg. Also, patient is to set up new injection schedule with nephrology. He was taken off schedule due to missed appointments. Patient is also to follow up with Dr. Lorrene Reid nephrology as scheduled. Patient has a history of end stage kidney disease. He has a right upper arm fistula placed, but has not started dialysis. During admission creatinine level was consistent with baseline. 5.9<5.6<5.5<5.4.  Patient to follow up at [facility] in 1 week to repeat CBC.  Atrial fibrillation:   Patient experienced increased shortness of breath for greater than 7 days prior to admission. He also noticed that it was difficult to lay flat. He says that he was sleeping sitting up.  On admission EKG showed Afib 75 bpm. Patient first experienced A fib  in 2017 and spontaneously converted. In April of this year, patient was NSR. He was taken off of coumadin earlier this year due to GI bleed and started on Eliquis.  Patient will discharge home on Amiodarone 200 mg twice daily and continue Eliquis 5 mg twice a day.   Chest pain:  Patient experienced chest pain. EKG was repeated, no ST changes noted. Troponin was <0.03. Patient underwent Myoview that was followed by cardiology. Patient cleared to discharge home.   Dorcas Mcmurray, NP cardiology has set up follow up with patient in cardiology in 2 weeks.  Patient was treated with Amiodarone drip.   A/C Systolic Heart Failure: Patient's ECHO 07/2017 EF 40-45%. Current ECHO showed that EF has decreased to 25-30%. Patient was admitted with dyspnea at rest that was complicated by anemia.  BNP was elevated on admission and chest x-ray showed edema.  NSR was restored being that ejection fraction was markedly decreased.  Patient's volume status was increased, he was diuresed with furosemide.  Carvedilol was increased to 9.375 mg twice daily, hydralazine increased to 25 mg twice a day along with Imdur 30 mg daily.  Patient will follow-up with cardiac rehab as scheduled.   Patient's upcoming appointments were discussed at length.  He is aware of appointment dates and times.  Patient will also return to employment on 06/16/2018 with restrictions indicated on letter.  Any further restrictions to be determined by primary care..  Return to work letter completed prior to discharge.  Patient is to follow-up with primary care for any additional forms. Patient was discharged home today in a hemodynamically stable condition.   Discharge Exam: Vitals:   06/06/18 1331 06/06/18 1332  BP: 119/62   Pulse: 83 84  Resp:    Temp:    SpO2:     Vitals:   06/06/18 1328 06/06/18 1329 06/06/18 1331 06/06/18 1332  BP:  (!) 119/56 119/62   Pulse: 77 88 83 84  Resp:      Temp:      TempSrc:      SpO2:      Weight:       Height:       Physical Exam Constitutional:      General: He is not in acute distress.    Appearance: He is well-developed.  HENT:     Head: Normocephalic.  Cardiovascular:     Rate and Rhythm: Rhythm irregular.  Pulmonary:     Breath sounds: No decreased breath sounds, wheezing, rhonchi or rales.  Chest:     Chest wall: No tenderness or edema.  Musculoskeletal: Normal range of motion.  Skin:    General: Skin is warm and dry.  Neurological:     Mental Status: He is alert and oriented to person, place, and time.  Psychiatric:        Mood and Affect: Mood normal.        Behavior: Behavior normal.     Discharge Instructions  Discharge Instructions    Discharge patient   Complete by:  As directed    Discharge disposition:  01-Home or Self Care   Discharge patient date:  06/06/2018     Allergies as of 06/06/2018   No Known  Allergies     Medication List    TAKE these medications   allopurinol 100 MG tablet Commonly known as:  ZYLOPRIM Take 100 mg by mouth 2 (two) times daily.   aspirin EC 81 MG tablet Take 81 mg by mouth daily.   calcitRIOL 0.5 MCG capsule Commonly known as:  ROCALTROL Take 0.5 mcg by mouth daily.   carvedilol 12.5 MG tablet Commonly known as:  COREG Take 1 tablet (12.5 mg total) by mouth 2 (two) times daily with a meal.   ELIQUIS 5 MG Tabs tablet Generic drug:  apixaban TAKE 1 TABLET BY MOUTH TWICE A DAY What changed:  how much to take   folic acid 1 MG tablet Commonly known as:  FOLVITE TAKE 1 TABLET (1 MG TOTAL) BY MOUTH DAILY. What changed:    how much to take  how to take this  when to take this  additional instructions   hydrALAZINE 25 MG tablet Commonly known as:  APRESOLINE Take 1 tablet (25 mg total) by mouth 2 (two) times daily.   hydroxyurea 500 MG capsule Commonly known as:  HYDREA TAKE ONE CAPSULE EVERY DAY WITH FOOD TO MINIMIZE GI SIDE EFFECTS What changed:    how much to take  how to take this  when to  take this  additional instructions   isosorbide mononitrate 30 MG 24 hr tablet Commonly known as:  IMDUR TAKE 1 TABLET BY MOUTH EVERY DAY   oxyCODONE 5 MG immediate release tablet Commonly known as:  Oxy IR/ROXICODONE Take 1 tablet (5 mg total) by mouth every 6 (six) hours as needed for severe pain.   pantoprazole 40 MG tablet Commonly known as:  PROTONIX Take 1 tablet (40 mg total) by mouth daily at 6 (six) AM.   torsemide 100 MG tablet Commonly known as:  DEMADEX Take 1 tablet (100 mg total) by mouth daily.   torsemide 100 MG tablet Commonly known as:  DEMADEX TAKE 1 TABLET BY MOUTH EVERY DAY       The results of significant diagnostics from this hospitalization (including imaging, microbiology, ancillary and laboratory) are listed below for reference.    Significant Diagnostic Studies: Dg Chest 2 View  Result Date: 06/02/2018 CLINICAL DATA:  Shortness of breath and intermittent generalized chest chest pain which has worsened progressively over the past week. History of CHF, sickle cell disease, hypertension, chronic renal insufficiency, former smoker. EXAM: CHEST - 2 VIEW COMPARISON:  Portable chest x-ray of September 18, 2017 and PA and lateral chest x-ray of December 24, 2015 FINDINGS: The lungs are mildly hyperinflated. The interstitial markings are increased bilaterally in this reflects a change from the previous study. There is no discrete infiltrate. There is no pleural effusion or pneumothorax. The cardiac silhouette is top-normal in size. The pulmonary vascularity is prominent centrally. There is calcification in the wall of the aortic arch. The bony thorax exhibits no acute abnormality. IMPRESSION: Mild interstitial edema likely secondary to CHF. No alveolar pneumonia nor pleural effusion. Underlying chronic bronchitic-smoking related changes, stable. Thoracic aortic atherosclerosis. Electronically Signed   By: David  Martinique M.D.   On: 06/02/2018 09:53    Microbiology: Recent  Results (from the past 240 hour(s))  Urine culture     Status: None   Collection Time: 06/02/18  5:09 PM  Result Value Ref Range Status   Specimen Description URINE, RANDOM  Final   Special Requests NONE  Final   Culture   Final    NO GROWTH Performed at [facility]  [facility], [place] [address] [place], [place] [address]    Report Status 06/04/2018 FINAL  Final     Labs: Basic Metabolic Panel: Recent Labs  Lab 06/02/18 0934 06/03/18 0454 06/04/18 0447 06/05/18 0622 06/06/18 0554  NA 136 140 137 135 137  K 4.2 3.6 3.6 3.6 3.8  CL 98 100 98 98 101  CO2 26 27 26 25 23   GLUCOSE 97 88 112* 108* 108*  BUN 91* 89* 82* 77* 76*  CREATININE 5.91* 5.66* 5.44* 5.27* 5.39*  CALCIUM 8.7* 8.8* 8.4* 8.5* 8.7*  MG  --   --  2.1  --   --   PHOS  --   --   --   --  5.8*   Liver Function Tests: Recent Labs  Lab 06/02/18 0934 06/03/18 0454 06/06/18 0554  AST 48* 33  --   ALT 37 30  --   ALKPHOS 63 56  --   BILITOT 1.5* 1.7*  --   PROT 6.4* 6.2*  --   ALBUMIN 3.4* 3.1* 3.0*   No results for input(s): LIPASE, AMYLASE in the last 168 hours. No results for input(s): AMMONIA in the last 168 hours. CBC: Recent Labs  Lab 06/02/18 0934 06/03/18 0454 06/04/18 0447 06/05/18 0622 06/06/18 0554  WBC 7.3 6.7 7.2 5.9 6.2  NEUTROABS 5.1 5.0 5.1  --  4.6  HGB 6.4* 7.2* 7.2* 7.4* 7.2*  HCT 18.6* 20.7* 20.7* 21.3* 20.7*  MCV 104.5* 101.0* 101.5* 100.5* 101.0*  PLT 128* 123* 142* 147* 149*   Cardiac Enzymes: Recent Labs  Lab 06/05/18 0934  TROPONINI <0.03   BNP: Invalid input(s): POCBNP CBG: No results for input(s): GLUCAP in the last 168 hours.  Time coordinating discharge: 50 minutes  Signed:  [name]  APRN, MSN, FNP-C [facility] [address] [place], [place] [address] [phone]  [facility] 06/06/2018, 4:10 PM

## 2018-06-06 NOTE — Progress Notes (Signed)
Lexiscan complete

## 2018-06-08 ENCOUNTER — Encounter (HOSPITAL_COMMUNITY): Payer: Self-pay | Admitting: Internal Medicine

## 2018-06-09 ENCOUNTER — Other Ambulatory Visit: Payer: Self-pay

## 2018-06-09 NOTE — Consult Note (Signed)
Community assignment for post hospital follow up.  Consent on file.  Natividad Brood, RN BSN [facility] Liaison  [phone] business mobile phone Toll free office [phone]

## 2018-06-12 ENCOUNTER — Other Ambulatory Visit: Payer: Self-pay | Admitting: *Deleted

## 2018-06-12 ENCOUNTER — Telehealth: Payer: Self-pay

## 2018-06-12 NOTE — Progress Notes (Signed)
Patient ID: Patrick [name], male   DOB: 09-Apr-1956, 62 y.o.   MRN: [id number]     Advanced Heart Failure Clinic Note   PCP: [name] : Dr Zigmund Daniel  Opthalmologist: [name] HF MD: Dr Haroldine Laws  HPI: [name] is a 62 y.o. male  with a history of sickle cell anemia, systolic HF due to NICM EF 40% 11/2012 initially diagnosed 05/2011, no CAD by cath 05/2011, CKD stage III, legally blind, and tobacco abuse.   Admitted 11/18/12 from HF clinic with voume overload. Hemoglobin 6.7 Transfused 2 UPRBC. Diuresed with IV lasix. Lisinopril stopped due to CKD. Discharge weight 143 pounds.    Admitted to [facility] 3/4 through 08/22/13 with anemia and volume overload. Hemglobin was 5.9. Received 1U PRBC with hemoglobin increased to 6.4 Diuresed with IV lasix. Discharged on carvedilol 25 mg twice a day, lasix 80 mg twice a day, hydralazine 25 mg tid, and imdur 30 mg daily.  D/C weight was 140 pounds.  He is not on Spironolactone due to hyperkalemia. He is not on ace inhibitor due to renal failure.  Admitted 7/6 through 4/48/1856 with A/C systolic heart failure and CKD in setting of new A fib. Started coumadin. Discharge weight was 135 pounds.   Admitted 4/3 - 09/24/17 with upper GI bleed and profound anemia with Hgb of 4.6 with Sickle Cell. Transfused with RBCs. EGD showed nonbleeding gastritis, biopsies were obtained. Pathology was pending at discharge. Colonoscopy showed small internal hemorrhoids, otherwise normal. Pt refused resumption of his AC and wished for follow up with his cardiologist prior to starting. Hydralazine, Imdur, and Torsemide were STOPPED.  Admitted 31/4970 with A/C systolic HF and sickle cell anemia. Diuresed with IV lasix and transitioned to torsemide 100 mg daily. Found to be back in Afib. Eliquis was continued. He was started on amio drip and underwent DCCV 12/19. Amio transitioned to PO. He required 1 uPRBCs for anemia. He also had chest pain with negative troponin and no  EKG changes. Myoview showed no significant ischemia, EF 46% (reviewed by Dr Hubert Azure).  HF meds optimized. DC weight: 144 lbs  He presents today for post hospital follow up. Overall doing well. Denies SOB. Able to walk up stairs and hills with no problems. Has not had anymore chest pain. No further bleeding. Denies edema, orthopnea, or PND. No dizziness. Good UOP with torsemide. Appetite and energy level okay. Not weighing at home. Limits fluid and salt intake. Taking all meds.   01/17/12 ECHO 55%-60%  11/20/12 ECHO EF 40% Peak PA pressure 56 11/30/13 ECHO 40-45%  12/2015: ECHO 40%.  07/2017: EF 40-45% 05/2018: EF 25-30%  SH: Lives with his cousin in [place]. Smokes 8 cigarettes per day. Occasional ETOH and no drugs  FH: Mother deceased: liver failure, no CAD        Father deceased: CAD, MI  Review of systems complete and found to be negative unless listed in HPI.   Past Medical History:  Diagnosis Date  . Blood dyscrasia   . Blood transfusion    "I've had a bunch of them"  . CHF (congestive heart failure) ([place])    followed by hf clinic  . Chronic kidney disease (CKD), stage III (moderate) (HCC)   . Gout   . YOVZCHYI(502.7)    "probably weekly" (01/13/2014)  . Legally blind   . Shortness of breath dyspnea   . Sickle cell disease, type SS ([place])   . Swelling abdomen   . Swelling of extremity, left   .  Swelling of extremity, right     Current Outpatient Medications  Medication Sig Dispense Refill  . allopurinol (ZYLOPRIM) 100 MG tablet Take 100 mg by mouth 2 (two) times daily.    Marland Kitchen aspirin EC 81 MG tablet Take 81 mg by mouth daily.    . calcitRIOL (ROCALTROL) 0.5 MCG capsule Take 0.5 mcg by mouth daily.  3  . carvedilol (COREG) 12.5 MG tablet Take 1 tablet (12.5 mg total) by mouth 2 (two) times daily with a meal. 60 tablet 6  . ELIQUIS 5 MG TABS tablet TAKE 1 TABLET BY MOUTH TWICE A DAY (Patient taking differently: Take 5 mg by mouth 2 (two) times daily. ) 60 tablet 5  . folic  acid (FOLVITE) 1 MG tablet TAKE 1 TABLET (1 MG TOTAL) BY MOUTH DAILY. (Patient taking differently: Take 1 mg by mouth daily. ) 30 tablet 11  . hydrALAZINE (APRESOLINE) 25 MG tablet Take 1 tablet (25 mg total) by mouth 2 (two) times daily. 60 tablet 6  . hydroxyurea (HYDREA) 500 MG capsule TAKE ONE CAPSULE EVERY DAY WITH FOOD TO MINIMIZE GI SIDE EFFECTS (Patient taking differently: Take 500 mg by mouth daily. TAKE WITH FOOD TO MINIMIZE GI SIDE EFFECTS) 30 capsule 11  . isosorbide mononitrate (IMDUR) 30 MG 24 hr tablet TAKE 1 TABLET BY MOUTH EVERY DAY (Patient taking differently: Take 30 mg by mouth daily. ) 90 tablet 2  . oxyCODONE (OXY IR/ROXICODONE) 5 MG immediate release tablet Take 1 tablet (5 mg total) by mouth every 6 (six) hours as needed for severe pain. 45 tablet 0  . pantoprazole (PROTONIX) 40 MG tablet Take 1 tablet (40 mg total) by mouth daily at 6 (six) AM. 30 tablet 0  . torsemide (DEMADEX) 100 MG tablet Take 1 tablet (100 mg total) by mouth daily. 30 tablet 3  . torsemide (DEMADEX) 100 MG tablet TAKE 1 TABLET BY MOUTH EVERY DAY 90 tablet 1   No current facility-administered medications for this encounter.    Vitals:   06/17/18 1116  BP: 136/76  Pulse: 82  SpO2: 98%  Weight: 67.7 kg (149 lb 3.2 oz)     Wt Readings from Last 3 Encounters:  06/17/18 67.7 kg (149 lb 3.2 oz)  06/06/18 65.3 kg (144 lb)  04/30/18 70.3 kg (155 lb)   PHYSICAL EXAM: General: Well appearing. No resp difficulty. HEENT: Normal Neck: Supple. JVP 5-6. Carotids 2+ bilat; no bruits. No thyromegaly or nodule noted. Cor: PMI nondisplaced. RRR, 2/6 TR, 2/6 MR Lungs: CTAB, normal effort. Abdomen: Soft, non-tender, non-distended, no HSM. No bruits or masses. +BS  Extremities: No cyanosis, clubbing, or rash. R and LLE no edema.  Neuro: Alert & orientedx3, cranial nerves grossly intact. moves all 4 extremities w/o difficulty. Affect pleasant  EKG: NSR with 1st degree AV block, 79 bpm. Personally reviewed.     ASSESSMENT & PLAN:  1. Chronic Combined Heart Failure:  NICM. Cath 05/2011 without significant coronary disease.  ECHO 12/2015 40%. Echo 2/19 EF 40-45%. Echo 05/2018: EF 25-30% - NYHA I-II symptoms - Volume status stable on exam.  - Continue torsemide 100 mg daily.   -Continue coreg 12.5 BID. - Continue hydralazine 25 mg BID.  - Continue Imdur 30 mg daily.  - He is not on Ace/Arb/spiro due to hyperkalemia/CKD - Reinforced fluid restriction to < 2 L daily, sodium restriction to less than 2000 mg daily, and the importance of daily weights.   - Repeat echo in 3-4 months with Dr Haroldine Laws. Will schedule  today.   2. HTN: - SBP 110-120s during admission. SBP 130s today. Will not increase BP meds today given CKD IV. 3  Sickle Cell:   - Follows closely with Sickle Cell Clinic. Has an appointment on Thursday. 4. Former smoker:   - Stopped 11/2015. No change.  5. CKD stable IV:  - Followed by Dr. Lorrene Reid. Needs to reschedule appointment.  - Has RUE Fistula.  - BMET today 6. PAF: s/p DCCV 05/2018 - This patients CHA2DS2-VASc is at least 3.  - EKG shows NSR today.  - Continue Eliquis 5 mg BID. Data previously reviewed and Eliquis use is appropriate in Sickle Cell patients, and shows decreased rates bleeding compared to coumadin, similar to Non-Sickle Cell patients.  7. GI bleeding: - EGD showed nonbleeding gastritis, biopsies were obtained. Pathology was pending at discharge. Colonoscopy showed small internal hemorrhoids, otherwise normal.  - No recurrence on Eliquis. He is also on aspirin 81 mg daily. Stop ASA today.  8. ED - Requesting Cialis. Discussed contraindication with imdur. Discussed with Dr Aundra Dubin (Dr Haroldine Laws off today) and will not order at this time.  9. Chest pain during admission. - Myoview 06/06/18: no significant ischemia, EF 46% - No further CP.  Doing well. Follow up in 4 weeks. BMET, CBC today. Can stop daily aspirin since he is on Eliquis. Schedule echo in 3-4 months to  reassess EF.   Georgiana Shore, NP  06/17/2018  Greater than 50% of the 25 minute visit was spent in counseling/coordination of care regarding disease state education, salt/fluid restriction, sliding scale diuretics, and medication compliance.

## 2018-06-12 NOTE — Patient Outreach (Signed)
[facility]) Care Management  06/12/2018  Patrick [name] 1955/07/31 [id number]   Declined [facility] servies  RN spoke with pt today and introduced the [facility] services and available programs. Explained the purpose of today's call after identifiers confirmed. Pt receptive to the call at this time indicating it was a convenient to time to have this discussion. Pt states he has all his medications and taking them as recommended with no needed refills. Reports his primary follow up appointment is tomorrow with Dr. Doreene Burke. RN discussed his recent admission and possible concerns related to pt's anemia regarding dietary measures and possible foods to consume to assist (pt is aware). RN offered to address any other needs related to possible community resources and/or assistance however pt declined and indicated no needs at this time. RN offered to follow up telephonically over the next few months to inquired on any potential needs for ongoing hospital preventions however pt again declined indicating he has support in the home and no additional issues at this time. RN informed pt that his provider would be updated on his disposition with [facility] services and provided pt RN name and contact number if needs arise in the future.    Patient was recently discharged from hospital and all medications have been reviewed.  Raina Mina, RN Care Management Coordinator [place] Office [phone]

## 2018-06-12 NOTE — Telephone Encounter (Signed)
Called and spoke with patient. He was asking if [place] had written him a return to work [employer]. This was in the computer, I have printed this out for patient. He states he will pick up tomorrow. Thanks!

## 2018-06-13 ENCOUNTER — Ambulatory Visit: Payer: PPO | Admitting: Family Medicine

## 2018-06-17 ENCOUNTER — Ambulatory Visit (HOSPITAL_COMMUNITY)
Admit: 2018-06-17 | Discharge: 2018-06-17 | Disposition: A | Discharge status: Home / Self Care | Payer: PPO | Attending: Cardiology | Admitting: Cardiology

## 2018-06-17 VITALS — BP 136/76 | HR 82 | Wt 149.2 lb

## 2018-06-17 DIAGNOSIS — I440 Atrioventricular block, first degree: Secondary | ICD-10-CM | POA: Insufficient documentation

## 2018-06-17 DIAGNOSIS — I4891 Unspecified atrial fibrillation: Secondary | ICD-10-CM | POA: Diagnosis not present

## 2018-06-17 DIAGNOSIS — I5032 Chronic diastolic (congestive) heart failure: Secondary | ICD-10-CM | POA: Diagnosis not present

## 2018-06-17 DIAGNOSIS — K648 Other hemorrhoids: Secondary | ICD-10-CM | POA: Diagnosis not present

## 2018-06-17 DIAGNOSIS — N184 Chronic kidney disease, stage 4 (severe): Secondary | ICD-10-CM

## 2018-06-17 DIAGNOSIS — I480 Paroxysmal atrial fibrillation: Secondary | ICD-10-CM | POA: Diagnosis not present

## 2018-06-17 DIAGNOSIS — F17210 Nicotine dependence, cigarettes, uncomplicated: Secondary | ICD-10-CM | POA: Diagnosis not present

## 2018-06-17 DIAGNOSIS — R079 Chest pain, unspecified: Secondary | ICD-10-CM | POA: Diagnosis not present

## 2018-06-17 DIAGNOSIS — I428 Other cardiomyopathies: Secondary | ICD-10-CM | POA: Diagnosis not present

## 2018-06-17 DIAGNOSIS — I130 Hypertensive heart and chronic kidney disease with heart failure and stage 1 through stage 4 chronic kidney disease, or unspecified chronic kidney disease: Secondary | ICD-10-CM | POA: Insufficient documentation

## 2018-06-17 DIAGNOSIS — D571 Sickle-cell disease without crisis: Secondary | ICD-10-CM | POA: Diagnosis not present

## 2018-06-17 DIAGNOSIS — I5022 Chronic systolic (congestive) heart failure: Secondary | ICD-10-CM | POA: Diagnosis not present

## 2018-06-17 DIAGNOSIS — I10 Essential (primary) hypertension: Secondary | ICD-10-CM | POA: Diagnosis not present

## 2018-06-17 DIAGNOSIS — I491 Atrial premature depolarization: Secondary | ICD-10-CM | POA: Diagnosis not present

## 2018-06-17 DIAGNOSIS — H548 Legal blindness, as defined in USA: Secondary | ICD-10-CM | POA: Diagnosis not present

## 2018-06-17 DIAGNOSIS — E875 Hyperkalemia: Secondary | ICD-10-CM | POA: Insufficient documentation

## 2018-06-17 DIAGNOSIS — M109 Gout, unspecified: Secondary | ICD-10-CM | POA: Insufficient documentation

## 2018-06-17 DIAGNOSIS — N529 Male erectile dysfunction, unspecified: Secondary | ICD-10-CM | POA: Diagnosis not present

## 2018-06-17 DIAGNOSIS — Z7982 Long term (current) use of aspirin: Secondary | ICD-10-CM | POA: Insufficient documentation

## 2018-06-17 DIAGNOSIS — Z79899 Other long term (current) drug therapy: Secondary | ICD-10-CM | POA: Insufficient documentation

## 2018-06-17 DIAGNOSIS — I5042 Chronic combined systolic (congestive) and diastolic (congestive) heart failure: Secondary | ICD-10-CM | POA: Insufficient documentation

## 2018-06-17 DIAGNOSIS — Z8249 Family history of ischemic heart disease and other diseases of the circulatory system: Secondary | ICD-10-CM | POA: Insufficient documentation

## 2018-06-17 DIAGNOSIS — Z7901 Long term (current) use of anticoagulants: Secondary | ICD-10-CM | POA: Insufficient documentation

## 2018-06-17 LAB — CBC
HCT: 22.9 % — ABNORMAL LOW (ref 39.0–52.0)
Hemoglobin: 8 g/dL — ABNORMAL LOW (ref 13.0–17.0)
MCH: 36.7 pg — ABNORMAL HIGH (ref 26.0–34.0)
MCHC: 34.9 g/dL (ref 30.0–36.0)
MCV: 105 fL — ABNORMAL HIGH (ref 80.0–100.0)
Platelets: 98 10*3/uL — ABNORMAL LOW (ref 150–400)
RBC: 2.18 MIL/uL — ABNORMAL LOW (ref 4.22–5.81)
RDW: 20.2 % — ABNORMAL HIGH (ref 11.5–15.5)
WBC: 6.5 10*3/uL (ref 4.0–10.5)
nRBC: 4.8 % — ABNORMAL HIGH (ref 0.0–0.2)

## 2018-06-17 LAB — BASIC METABOLIC PANEL
Anion gap: 14 (ref 5–15)
BUN: 93 mg/dL — ABNORMAL HIGH (ref 6–20)
CO2: 23 mmol/L (ref 22–32)
Calcium: 9.1 mg/dL (ref 8.9–10.3)
Chloride: 100 mmol/L (ref 98–111)
Creatinine, Ser: 5.95 mg/dL — ABNORMAL HIGH (ref 0.61–1.24)
GFR calc Af Amer: 11 mL/min — ABNORMAL LOW (ref >60)
GFR calc non Af Amer: 9 mL/min — ABNORMAL LOW (ref >60)
Glucose, Bld: 94 mg/dL (ref 70–99)
Potassium: 4.2 mmol/L (ref 3.5–5.1)
Sodium: 137 mmol/L (ref 135–145)

## 2018-06-17 NOTE — Patient Instructions (Signed)
Labs today  STOP taking Aspirin.  Follow up appointment on 07/22/2018 at 4:30am.  Echo and f/u appointment with Dr. Haroldine Laws in 3-4 months.

## 2018-06-19 ENCOUNTER — Encounter: Payer: Self-pay | Admitting: Family Medicine

## 2018-06-19 ENCOUNTER — Ambulatory Visit (INDEPENDENT_AMBULATORY_CARE_PROVIDER_SITE_OTHER): Payer: PPO | Admitting: Family Medicine

## 2018-06-19 VITALS — BP 143/81 | HR 87 | Temp 97.7°F | Resp 14 | Ht 71.0 in | Wt 153.0 lb

## 2018-06-19 DIAGNOSIS — N186 End stage renal disease: Secondary | ICD-10-CM | POA: Diagnosis not present

## 2018-06-19 DIAGNOSIS — I4891 Unspecified atrial fibrillation: Secondary | ICD-10-CM

## 2018-06-19 DIAGNOSIS — I5022 Chronic systolic (congestive) heart failure: Secondary | ICD-10-CM | POA: Diagnosis not present

## 2018-06-19 DIAGNOSIS — D571 Sickle-cell disease without crisis: Secondary | ICD-10-CM | POA: Diagnosis not present

## 2018-06-19 LAB — POCT URINALYSIS DIPSTICK
Bilirubin, UA: NEGATIVE
Blood, UA: NEGATIVE
Glucose, UA: NEGATIVE
Ketones, UA: NEGATIVE
Nitrite, UA: NEGATIVE
Protein, UA: POSITIVE — AB
Spec Grav, UA: 1.015 (ref 1.010–1.025)
Urobilinogen, UA: 0.2 E.U./dL
pH, UA: 6 (ref 5.0–8.0)

## 2018-06-19 MED ORDER — OXYCODONE HCL 5 MG PO TABS: 5.0000 mg | ORAL_TABLET | Freq: Four times a day (QID) | ORAL | 0 refills | Status: DC | PRN

## 2018-06-19 NOTE — Progress Notes (Signed)
Patient [facility]   Progress Note: General Provider: Lanae Boast, FNP  SUBJECTIVE:   Patrick [name] is a 63 y.o. male who  has a past medical history of Blood dyscrasia, Blood transfusion, CHF (congestive heart failure) ([place]), Chronic kidney disease (CKD), stage III (moderate) ([place]), Gout, Headache(784.0), Legally blind, Shortness of breath dyspnea, Sickle cell disease, type SS ([place]), Swelling abdomen, Swelling of extremity, left, and Swelling of extremity, right.. Patient presents today for Sickle Cell Anemia and Medication Refill (oxycodone )  Patient discharged from hospital on 06/06/2018 due to hypoxia and hemoglobin of 6.4. he is being seen by nephrology for end stage kidney disease. Patient has a history of end stage kidney disease. He has a right upper arm fistula placed, but has not started dialysis.  Patient with a hx of Afib. Started on eliquis 5 mg BID.  Patient seen by heart care on 06/17/2018 and will follow up with them in 4 weeks and instructed to have fluid restriction of less than 2L per day.   Review of Systems  Constitutional: Negative.   HENT: Negative.   Eyes: Negative.   Respiratory: Negative.   Cardiovascular: Negative.   Gastrointestinal: Negative.   Genitourinary: Negative.   Musculoskeletal: Negative.   Skin: Negative.   Neurological: Negative.   Psychiatric/Behavioral: Negative.      OBJECTIVE: BP (!) 143/81 (BP Location: Left Arm, Patient Position: Sitting, Cuff Size: Normal)   Pulse 87   Temp 97.7 F (36.5 C) (Oral)   Resp 14   Ht 5\' 11"  (1.803 m)   Wt 153 lb (69.4 kg)   SpO2 100%   BMI 21.34 kg/m   Wt Readings from Last 3 Encounters:  06/19/18 153 lb (69.4 kg)  06/17/18 149 lb 3.2 oz (67.7 kg)  06/06/18 144 lb (65.3 kg)     Physical Exam Vitals signs and nursing note reviewed.  Constitutional:      General: He is not in acute distress.    Appearance: He is well-developed.  HENT:   Head: Normocephalic and atraumatic.  Eyes:     Conjunctiva/sclera: Conjunctivae normal.     Pupils: Pupils are equal, round, and reactive to light.  Neck:     Musculoskeletal: Normal range of motion.  Cardiovascular:     Rate and Rhythm: Normal rate and regular rhythm.     Heart sounds: Normal heart sounds.  Pulmonary:     Effort: Pulmonary effort is normal. No respiratory distress.     Breath sounds: Normal breath sounds.  Abdominal:     General: Bowel sounds are normal. There is no distension.     Palpations: Abdomen is soft.  Musculoskeletal: Normal range of motion.  Skin:    General: Skin is warm and dry.  Neurological:     Mental Status: He is alert and oriented to person, place, and time.  Psychiatric:        Behavior: Behavior normal.        Thought Content: Thought content normal.     ASSESSMENT/PLAN:  1. Hb-SS disease without crisis ([place]) Refilled medication - Urinalysis Dipstick - CBC with Differential - Basic Metabolic Panel - oxyCODONE (OXY IR/ROXICODONE) 5 MG immediate release tablet; Take 1 tablet (5 mg total) by mouth every 6 (six) hours as needed for up to 15 days for severe pain.  Dispense: 60 tablet; Refill: 0  2. Chronic systolic congestive heart failure (HCC) Fluid restriction of 2 L per day. Followed by heart care.  3. Atrial fibrillation, unspecified type ([place]) Continue with eliquis.   4. End stage renal disease ([place]) Fistula placed. Does not know when or if he will start dialysis.     Return in about 2 months (around 08/18/2018) for SCD.    The patient was given clear instructions to go to ER or return to medical center if symptoms do not improve, worsen or new problems develop. The patient verbalized understanding and agreed with plan of care.   Ms. Doug Sou. Nathaneil Canary, FNP-BC Patient [facility] [address] [place], [place] [address] [phone]

## 2018-06-19 NOTE — Patient Instructions (Signed)
Atrial Fibrillation  Atrial fibrillation is a type of heartbeat that is irregular or fast (rapid). If you have this condition, your heart beats without any order. This makes it hard for your heart to pump blood in a normal way. Having this condition gives you more risk for stroke, heart failure, and other heart problems. Atrial fibrillation may start all of a sudden and then stop on its own, or it may become a long-lasting problem. What are the causes? This condition may be caused by heart conditions, such as:  High blood pressure.  Heart failure.  Heart valve disease.  Heart surgery. Other causes include:  Pneumonia.  Obstructive sleep apnea.  Lung cancer.  Thyroid disease.  Drinking too much alcohol. Sometimes the cause is not known. What increases the risk? You are more likely to develop this condition if:  You smoke.  You are older.  You have diabetes.  You are overweight.  You have a family history of this condition.  You exercise often and hard. What are the signs or symptoms? Common symptoms of this condition include:  A feeling like your heart is beating very fast.  Chest pain.  Feeling short of breath.  Feeling light-headed or weak.  Getting tired easily. Follow these instructions at home: Medicines  Take over-the-counter and prescription medicines only as told by your doctor.  If your doctor gives you a blood-thinning medicine, take it exactly as told. Taking too much of it can cause bleeding. Taking too little of it does not protect you against clots. Clots can cause a stroke. Lifestyle      Do not use any tobacco products. These include cigarettes, chewing tobacco, and e-cigarettes. If you need help quitting, ask your doctor.  Do not drink alcohol.  Do not drink beverages that have caffeine. These include coffee, soda, and tea.  Follow diet instructions as told by your doctor.  Exercise regularly as told by your doctor. General  instructions  If you have a condition that causes breathing to stop for a short period of time (apnea), treat it as told by your doctor.  Keep a healthy weight. Do not use diet pills unless your doctor says they are safe for you. Diet pills may make heart problems worse.  Keep all follow-up visits as told by your doctor. This is important. Contact a doctor if:  You notice a change in the speed, rhythm, or strength of your heartbeat.  You are taking a blood-thinning medicine and you see more bruising.  You get tired more easily when you move or exercise.  You have a sudden change in weight. Get help right away if:   You have pain in your chest or your belly (abdomen).  You have trouble breathing.  You have blood in your vomit, poop, or pee (urine).  You have any signs of a stroke. "BE FAST" is an easy way to remember the main warning signs: ? B - Balance. Signs are dizziness, sudden trouble walking, or loss of balance. ? E - Eyes. Signs are trouble seeing or a change in how you see. ? F - Face. Signs are sudden weakness or loss of feeling in the face, or the face or eyelid drooping on one side. ? A - Arms. Signs are weakness or loss of feeling in an arm. This happens suddenly and usually on one side of the body. ? S - Speech. Signs are sudden trouble speaking, slurred speech, or trouble understanding what people say. ? T - Time.  Time to call emergency services. Write down what time symptoms started.  You have other signs of a stroke, such as: ? A sudden, very bad headache with no known cause. ? Feeling sick to your stomach (nausea). ? Throwing up (vomiting). ? Jerky movements you cannot control (seizure). These symptoms may be an emergency. Do not wait to see if the symptoms will go away. Get medical help right away. Call your local emergency services (911 in the [place] Do not drive yourself to the hospital. Summary  Atrial fibrillation is a type of heartbeat that is irregular  or fast (rapid).  You are at higher risk of this condition if you smoke, are older, have diabetes, or are overweight.  Follow your doctor's instructions about medicines, diet, exercise, and follow-up visits.  Get help right away if you think that you have signs of a stroke. This information is not intended to replace advice given to you by your health care provider. Make sure you discuss any questions you have with your health care provider. Document Released: 03/13/2008 Document Revised: 07/26/2017 Document Reviewed: 07/26/2017 Elsevier Interactive Patient Education  2019 [employer]. Heart Failure  Heart failure means your heart has trouble pumping blood. This makes it hard for your body to work well. Heart failure is usually a long-term (chronic) condition. You must take good care of yourself and follow your treatment plan from your doctor. Follow these instructions at home: Medicines  Take over-the-counter and prescription medicines only as told by your doctor. ? Do not stop taking your medicine unless your doctor told you to do that. ? Do not skip any doses. ? Refill your prescriptions before you run out of medicine. You need your medicines every day. Eating and drinking   Eat heart-healthy foods. Talk with a diet and nutrition specialist (dietitian) to make an eating plan.  Choose foods that: ? Have no trans fat. ? Are low in saturated fat and cholesterol.  Choose healthy foods, like: ? Fresh or frozen fruits and vegetables. ? Fish. ? Low-fat (lean) meats. ? Legumes (like beans, peas, and lentils). ? Fat-free or low-fat dairy products. ? Whole-grain foods. ? High-fiber foods.  Limit salt (sodium) if told by your doctor. Ask your nutrition specialist to recommend heart-healthy seasonings.  Cook in healthy ways instead of frying. Healthy ways of cooking include: ? Roasting. ? Grilling. ? Broiling. ? Baking. ? Poaching. ? Steaming. ? Stir-frying.  Limit how much  fluid you drink, if told by your doctor. Lifestyle  Do not smoke or use chewing tobacco. Do not use nicotine gum or patches before talking to your doctor.  Limit alcohol intake to no more than 1 drink a day for non-pregnant women and 2 drinks a day for men. One drink equals 12 oz of beer, 5 oz of wine, or 1 oz of hard liquor. ? Tell your doctor if you drink alcohol many times a week. ? Talk with your doctor about whether any alcohol is safe for you. ? You should stop drinking alcohol: ? If your heart has been damaged by alcohol. ? You have very bad heart failure.  Do not use illegal drugs.  Lose weight if told by your doctor.  Do moderate physical activity if told by your doctor. Ask your doctor what activities are safe for you if: ? You are of older age (elderly). ? You have very bad heart failure. Keep track of important information  Weigh yourself every day. ? Weigh yourself every morning after you pee (  urinate) and before breakfast. ? Wear the same amount of clothing each time. ? Write down your daily weight. Give your record to your doctor.  Check and write down your blood pressure as told by your doctor.  Check your pulse as told by your doctor. Dealing with heat and cold  If the weather is very hot: ? Avoid activity that takes a lot of energy. ? Use air conditioning or fans, or find a cooler place. ? Avoid caffeine. ? Avoid alcohol. ? Wear clothing that is loose-fitting, lightweight, and light-colored.  If the weather is very cold: ? Avoid activity that takes a lot of energy. ? Layer your clothes. ? Wear mittens or gloves, a hat, and a scarf when you go outside. ? Avoid alcohol. General instructions  Manage other conditions that you have as told by your doctor.  Learn to manage stress. If you need help, ask your doctor.  Plan rest periods for when you get tired.  Get education and support as needed.  Get rehab (rehabilitation) to help you stay independent  and to help with everyday tasks.  Stay up to date with shots (immunizations), especially pneumococcal and flu (influenza) shots.  Keep all follow-up visits as told by your doctor. This is important. Contact a doctor if:  You gain weight quickly.  You are more short of breath than normal.  You cannot do your normal activities.  You tire easily.  You cough more than normal, especially with activity.  You have any or more puffiness (swelling) in areas such as your hands, feet, ankles, or belly (abdomen).  You cannot sleep because it is hard to breathe.  You feel like your heart is beating fast (palpitations).  You get dizzy or light-headed when you stand up. Get help right away if:  You have trouble breathing.  You or someone else notices a change in your awareness. This could be trouble staying awake or trouble concentrating.  You have chest pain or discomfort.  You pass out (faint). Summary  Heart failure means your heart has trouble pumping blood.  Make sure you refill your prescriptions before you run out of medicine. You need your medicines every day.  Keep records of your weight and blood pressure to give to your doctor.  Contact a doctor if you gain weight quickly. This information is not intended to replace advice given to you by your health care provider. Make sure you discuss any questions you have with your health care provider. Document Released: 03/13/2008 Document Revised: 02/26/2018 Document Reviewed: 06/26/2016 Elsevier Interactive Patient Education  2019 [employer]. Sickle Cell Anemia, Adult Sickle cell anemia is a condition where your red blood cells are shaped like sickles. Red blood cells carry oxygen through the body. Sickle-shaped cells do not live as long as normal red blood cells. They also clump together and block blood from flowing through the blood vessels. This prevents the body from getting enough oxygen. Sickle cell anemia causes organ  damage and pain. It also increases the risk of infection. Follow these instructions at home: Medicines  Take over-the-counter and prescription medicines only as told by your doctor.  If you were prescribed an antibiotic medicine, take it as told by your doctor. Do not stop taking the antibiotic even if you start to feel better.  If you develop a fever, do not take medicines to lower the fever right away. Tell your doctor about the fever. Managing pain, stiffness, and swelling  Try these methods to help with  pain: ? Use a heating pad. ? Take a warm bath. ? Distract yourself, such as by watching TV. Eating and drinking  Drink enough fluid to keep your pee (urine) clear or pale yellow. Drink more in hot weather and during exercise.  Limit or avoid alcohol.  Eat a healthy diet. Eat plenty of fruits, vegetables, whole grains, and lean protein.  Take vitamins and supplements as told by your doctor. Traveling  When traveling, keep these with you: ? Your medical information. ? The names of your doctors. ? Your medicines.  If you need to take an airplane, talk to your doctor first. Activity  Rest often.  Avoid exercises that make your heart beat much faster, such as jogging. General instructions  Do not use products that have nicotine or tobacco, such as cigarettes and e-cigarettes. If you need help quitting, ask your doctor.  Consider wearing a medical alert bracelet.  Avoid being in high places (high altitudes), such as mountains.  Avoid very hot or cold temperatures.  Avoid places where the temperature changes a lot.  Keep all follow-up visits as told by your doctor. This is important. Contact a doctor if:  A joint hurts.  Your feet or hands hurt or swell.  You feel tired (fatigued). Get help right away if:  You have symptoms of infection. These include: ? Fever. ? Chills. ? Being very tired. ? Irritability. ? Poor eating. ? Throwing up (vomiting).  You  feel dizzy or faint.  You have new stomach pain, especially on the left side.  You have a an erection (priapism) that lasts more than 4 hours.  You have numbness in your arms or legs.  You have a hard time moving your arms or legs.  You have trouble talking.  You have pain that does not go away when you take medicine.  You are short of breath.  You are breathing fast.  You have a long-term cough.  You have pain in your chest.  You have a bad headache.  You have a stiff neck.  Your stomach looks bloated even though you did not eat much.  Your skin is pale.  You suddenly cannot see well. Summary  Sickle cell anemia is a condition where your red blood cells are shaped like sickles.  Follow your doctor's advice on ways to manage pain, food to eat, activities to do, and steps to take for safe travel.  Get medical help right away if you have any signs of infection, such as a fever. This information is not intended to replace advice given to you by your health care provider. Make sure you discuss any questions you have with your health care provider. Document Released: 03/25/2013 Document Revised: 07/10/2016 Document Reviewed: 07/10/2016 Elsevier Interactive Patient Education  2019 [employer].

## 2018-06-20 LAB — BASIC METABOLIC PANEL
BUN/Creatinine Ratio: 16 (ref 10–24)
BUN: 91 mg/dL (ref 8–27)
CO2: 20 mmol/L (ref 20–29)
Calcium: 9 mg/dL (ref 8.6–10.2)
Chloride: 98 mmol/L (ref 96–106)
Creatinine, Ser: 5.79 mg/dL — ABNORMAL HIGH (ref 0.76–1.27)
GFR calc Af Amer: 11 mL/min/{1.73_m2} — ABNORMAL LOW (ref >59)
GFR calc non Af Amer: 10 mL/min/{1.73_m2} — ABNORMAL LOW (ref >59)
Glucose: 86 mg/dL (ref 65–99)
Potassium: 4.4 mmol/L (ref 3.5–5.2)
Sodium: 138 mmol/L (ref 134–144)

## 2018-06-20 LAB — 737588 9+OXYCODONE+CRT-UNBUND
Amphetamine Scrn, Ur: NEGATIVE ng/mL
BARBITURATE SCREEN URINE: NEGATIVE ng/mL
BENZODIAZEPINE SCREEN, URINE: NEGATIVE ng/mL
CANNABINOIDS UR QL SCN: NEGATIVE ng/mL
Cocaine (Metab) Scrn, Ur: NEGATIVE ng/mL
Creatinine(Crt), U: 58.4 mg/dL (ref 20.0–300.0)
Methadone Screen, Urine: NEGATIVE ng/mL
OXYCODONE+OXYMORPHONE UR QL SCN: NEGATIVE ng/mL
Opiate Scrn, Ur: NEGATIVE ng/mL
Ph of Urine: 5.9 (ref 4.5–8.9)
Phencyclidine Qn, Ur: NEGATIVE ng/mL
Propoxyphene Scrn, Ur: NEGATIVE ng/mL

## 2018-06-20 LAB — CBC WITH DIFFERENTIAL/PLATELET
Basophils Absolute: 0.1 10*3/uL (ref 0.0–0.2)
Basos: 1 %
EOS (ABSOLUTE): 0.3 10*3/uL (ref 0.0–0.4)
Eos: 4 %
Hematocrit: 21.7 % — ABNORMAL LOW (ref 37.5–51.0)
Hemoglobin: 7.4 g/dL — ABNORMAL LOW (ref 13.0–17.7)
Immature Grans (Abs): 0.1 10*3/uL (ref 0.0–0.1)
Immature Granulocytes: 1 %
Lymphocytes Absolute: 1 10*3/uL (ref 0.7–3.1)
Lymphs: 15 %
MCH: 36.1 pg — ABNORMAL HIGH (ref 26.6–33.0)
MCHC: 34.1 g/dL (ref 31.5–35.7)
MCV: 106 fL — ABNORMAL HIGH (ref 79–97)
Monocytes Absolute: 1.3 10*3/uL — ABNORMAL HIGH (ref 0.1–0.9)
Monocytes: 20 %
NRBC: 5 % — ABNORMAL HIGH (ref 0–0)
Neutrophils Absolute: 3.8 10*3/uL (ref 1.4–7.0)
Neutrophils: 59 %
Platelets: 90 10*3/uL — CL (ref 150–450)
RBC: 2.05 x10E6/uL — CL (ref 4.14–5.80)
RDW: 22.1 % — ABNORMAL HIGH (ref 12.3–15.4)
WBC: 6.4 10*3/uL (ref 3.4–10.8)

## 2018-07-14 ENCOUNTER — Other Ambulatory Visit (HOSPITAL_COMMUNITY): Payer: Self-pay | Admitting: Internal Medicine

## 2018-07-22 ENCOUNTER — Other Ambulatory Visit: Payer: Self-pay

## 2018-07-22 ENCOUNTER — Ambulatory Visit (HOSPITAL_COMMUNITY)
Admission: RE | Admit: 2018-07-22 | Discharge: 2018-07-22 | Disposition: A | Discharge status: Home / Self Care | Payer: PPO | Source: Ambulatory Visit | Attending: Cardiology | Admitting: Cardiology

## 2018-07-22 ENCOUNTER — Encounter (HOSPITAL_COMMUNITY): Payer: PPO

## 2018-07-22 ENCOUNTER — Encounter (HOSPITAL_COMMUNITY): Payer: Self-pay

## 2018-07-22 VITALS — BP 132/78 | HR 86 | Wt 148.0 lb

## 2018-07-22 DIAGNOSIS — N183 Chronic kidney disease, stage 3 (moderate): Secondary | ICD-10-CM | POA: Diagnosis not present

## 2018-07-22 DIAGNOSIS — M109 Gout, unspecified: Secondary | ICD-10-CM | POA: Diagnosis not present

## 2018-07-22 DIAGNOSIS — Z7901 Long term (current) use of anticoagulants: Secondary | ICD-10-CM | POA: Diagnosis not present

## 2018-07-22 DIAGNOSIS — I120 Hypertensive chronic kidney disease with stage 5 chronic kidney disease or end stage renal disease: Secondary | ICD-10-CM | POA: Diagnosis not present

## 2018-07-22 DIAGNOSIS — Z79899 Other long term (current) drug therapy: Secondary | ICD-10-CM | POA: Diagnosis not present

## 2018-07-22 DIAGNOSIS — I5042 Chronic combined systolic (congestive) and diastolic (congestive) heart failure: Secondary | ICD-10-CM | POA: Diagnosis not present

## 2018-07-22 DIAGNOSIS — D631 Anemia in chronic kidney disease: Secondary | ICD-10-CM | POA: Diagnosis not present

## 2018-07-22 DIAGNOSIS — I10 Essential (primary) hypertension: Secondary | ICD-10-CM | POA: Diagnosis not present

## 2018-07-22 DIAGNOSIS — N2581 Secondary hyperparathyroidism of renal origin: Secondary | ICD-10-CM | POA: Diagnosis not present

## 2018-07-22 DIAGNOSIS — N529 Male erectile dysfunction, unspecified: Secondary | ICD-10-CM | POA: Diagnosis not present

## 2018-07-22 DIAGNOSIS — Z8249 Family history of ischemic heart disease and other diseases of the circulatory system: Secondary | ICD-10-CM | POA: Diagnosis not present

## 2018-07-22 DIAGNOSIS — I428 Other cardiomyopathies: Secondary | ICD-10-CM | POA: Insufficient documentation

## 2018-07-22 DIAGNOSIS — Z720 Tobacco use: Secondary | ICD-10-CM | POA: Diagnosis not present

## 2018-07-22 DIAGNOSIS — N184 Chronic kidney disease, stage 4 (severe): Secondary | ICD-10-CM

## 2018-07-22 DIAGNOSIS — F17210 Nicotine dependence, cigarettes, uncomplicated: Secondary | ICD-10-CM | POA: Insufficient documentation

## 2018-07-22 DIAGNOSIS — D571 Sickle-cell disease without crisis: Secondary | ICD-10-CM | POA: Diagnosis not present

## 2018-07-22 DIAGNOSIS — I5020 Unspecified systolic (congestive) heart failure: Secondary | ICD-10-CM | POA: Diagnosis not present

## 2018-07-22 DIAGNOSIS — I5032 Chronic diastolic (congestive) heart failure: Secondary | ICD-10-CM | POA: Diagnosis not present

## 2018-07-22 DIAGNOSIS — I4891 Unspecified atrial fibrillation: Secondary | ICD-10-CM | POA: Diagnosis not present

## 2018-07-22 DIAGNOSIS — I480 Paroxysmal atrial fibrillation: Secondary | ICD-10-CM | POA: Diagnosis not present

## 2018-07-22 DIAGNOSIS — I130 Hypertensive heart and chronic kidney disease with heart failure and stage 1 through stage 4 chronic kidney disease, or unspecified chronic kidney disease: Secondary | ICD-10-CM | POA: Insufficient documentation

## 2018-07-22 DIAGNOSIS — N185 Chronic kidney disease, stage 5: Secondary | ICD-10-CM | POA: Diagnosis not present

## 2018-07-22 DIAGNOSIS — E875 Hyperkalemia: Secondary | ICD-10-CM | POA: Diagnosis not present

## 2018-07-22 DIAGNOSIS — N189 Chronic kidney disease, unspecified: Secondary | ICD-10-CM | POA: Diagnosis not present

## 2018-07-22 DIAGNOSIS — I770 Arteriovenous fistula, acquired: Secondary | ICD-10-CM | POA: Diagnosis not present

## 2018-07-22 MED ORDER — SILDENAFIL CITRATE 50 MG PO TABS: 50.0000 mg | ORAL_TABLET | Freq: Every day | ORAL | 0 refills | Status: DC | PRN

## 2018-07-22 NOTE — Patient Instructions (Signed)
START Sildenafil 50mg  (1 tab) as needed. DO NOT TAKE the same day as isosorbide.   Your physician has requested that you have an echocardiogram. Echocardiography is a painless test that uses sound waves to create images of your heart. It provides your doctor with information about the size and shape of your heart and how well your heart's chambers and valves are working. This procedure takes approximately one hour. There are no restrictions for this procedure.  Follow up with Dr. Haroldine Laws in April

## 2018-07-22 NOTE — Progress Notes (Signed)
Patient ID: Patrick [name], male   DOB: 03/12/1956, 63 y.o.   MRN: [id number]     Advanced Heart Failure Clinic Note   PCP: [name] : Dr Zigmund Daniel  Opthalmologist: [name] HF MD: Dr Haroldine Laws  HPI: Patrick [name] is a 63 y.o. male  with a history of sickle cell anemia, systolic HF due to NICM EF 40% 11/2012 initially diagnosed 05/2011, no CAD by cath 05/2011, CKD stage III, legally blind, and tobacco abuse.   Admitted 11/18/12 from HF clinic with voume overload. Hemoglobin 6.7 Transfused 2 UPRBC. Diuresed with IV lasix. Lisinopril stopped due to CKD. Discharge weight 143 pounds.    Admitted to [facility] 3/4 through 08/22/13 with anemia and volume overload. Hemglobin was 5.9. Received 1U PRBC with hemoglobin increased to 6.4 Diuresed with IV lasix. Discharged on carvedilol 25 mg twice a day, lasix 80 mg twice a day, hydralazine 25 mg tid, and imdur 30 mg daily.  D/C weight was 140 pounds.  He is not on Spironolactone due to hyperkalemia. He is not on ace inhibitor due to renal failure.  Admitted 7/6 through 7/82/9562 with A/C systolic heart failure and CKD in setting of new A fib. Started coumadin. Discharge weight was 135 pounds.   Admitted 4/3 - 09/24/17 with upper GI bleed and profound anemia with Hgb of 4.6 with Sickle Cell. Transfused with RBCs. EGD showed nonbleeding gastritis, biopsies were obtained. Pathology was pending at discharge. Colonoscopy showed small internal hemorrhoids, otherwise normal. Pt refused resumption of his AC and wished for follow up with his cardiologist prior to starting. Hydralazine, Imdur, and Torsemide were STOPPED.  Admitted 13/0865 with A/C systolic HF and sickle cell anemia. Diuresed with IV lasix and transitioned to torsemide 100 mg daily. Found to be back in Afib. Eliquis was continued. He was started on amio drip and underwent DCCV 12/19. Amio transitioned to PO. He required 1 uPRBCs for anemia. He also had chest pain with negative troponin and no  EKG changes. Myoview showed no significant ischemia, EF 46% (reviewed by Dr Hubert Azure).  HF meds optimized. DC weight: 144 lbs  Today he returns for HF follow up. Overall feeling fine. Denies SOB/PND/Orthopnea. Appetite ok. No fever or chills. Weight at home has been stable. Continues to work full time.  Taking all medications. Having difficulty with EF.    01/17/12 ECHO 55%-60%  11/20/12 ECHO EF 40% Peak PA pressure 56 11/30/13 ECHO 40-45%  12/2015: ECHO 40%.  07/2017: EF 40-45% 05/2018: EF 25-30%  SH: Lives with his cousin in [place]. Smokes 8 cigarettes per day. Occasional ETOH and no drugs  FH: Mother deceased: liver failure, no CAD        Father deceased: CAD, MI  Review of systems complete and found to be negative unless listed in HPI.   Past Medical History:  Diagnosis Date  . Blood dyscrasia   . Blood transfusion    "I've had a bunch of them"  . CHF (congestive heart failure) ([place])    followed by hf clinic  . Chronic kidney disease (CKD), stage III (moderate) (HCC)   . Gout   . HQIONGEX(528.4)    "probably weekly" (01/13/2014)  . Legally blind   . Shortness of breath dyspnea   . Sickle cell disease, type SS ([place])   . Swelling abdomen   . Swelling of extremity, left   . Swelling of extremity, right     Current Outpatient Medications  Medication Sig Dispense Refill  . allopurinol (ZYLOPRIM)  100 MG tablet Take 100 mg by mouth 2 (two) times daily.    . calcitRIOL (ROCALTROL) 0.5 MCG capsule Take 0.5 mcg by mouth daily.  3  . carvedilol (COREG) 12.5 MG tablet TAKE 1 TABLET BY MOUTH 2 (TWO) TIMES DAILY WITH A MEAL. 180 tablet 2  . ELIQUIS 5 MG TABS tablet TAKE 1 TABLET BY MOUTH TWICE A DAY (Patient taking differently: Take 5 mg by mouth 2 (two) times daily. ) 60 tablet 5  . folic acid (FOLVITE) 1 MG tablet TAKE 1 TABLET (1 MG TOTAL) BY MOUTH DAILY. (Patient taking differently: Take 1 mg by mouth daily. ) 30 tablet 11  . hydrALAZINE (APRESOLINE) 25 MG tablet Take 1 tablet (25  mg total) by mouth 2 (two) times daily. 60 tablet 6  . hydroxyurea (HYDREA) 500 MG capsule TAKE ONE CAPSULE EVERY DAY WITH FOOD TO MINIMIZE GI SIDE EFFECTS (Patient taking differently: Take 500 mg by mouth daily. TAKE WITH FOOD TO MINIMIZE GI SIDE EFFECTS) 30 capsule 11  . isosorbide mononitrate (IMDUR) 30 MG 24 hr tablet TAKE 1 TABLET BY MOUTH EVERY DAY (Patient taking differently: Take 30 mg by mouth daily. ) 90 tablet 2  . pantoprazole (PROTONIX) 40 MG tablet Take 1 tablet (40 mg total) by mouth daily at 6 (six) AM. 30 tablet 0  . torsemide (DEMADEX) 100 MG tablet Take 1 tablet (100 mg total) by mouth daily. 30 tablet 3   No current facility-administered medications for this encounter.    Vitals:   07/22/18 1502  BP: 132/78  Pulse: 86  SpO2: 96%  Weight: 67.1 kg (148 lb)     Wt Readings from Last 3 Encounters:  07/22/18 67.1 kg (148 lb)  06/19/18 69.4 kg (153 lb)  06/17/18 67.7 kg (149 lb 3.2 oz)   PHYSICAL EXAM: General:  Well appearing. No resp difficulty HEENT: normal Neck: supple. no JVD. Carotids 2+ bilat; no bruits. No lymphadenopathy or thryomegaly appreciated. Cor: PMI nondisplaced. Regular rate & rhythm. No rubs, gallops or murmurs. Lungs: clear Abdomen: soft, nontender, nondistended. No hepatosplenomegaly. No bruits or masses. Good bowel sounds. Extremities: no cyanosis, clubbing, rash, edema. RUE AV fistula Neuro: alert & orientedx3, cranial nerves grossly intact. moves all 4 extremities w/o difficulty. Affect pleasant  ASSESSMENT & PLAN:  1. Chronic Combined Heart Failure:  NICM. Cath 05/2011 without significant coronary disease.  ECHO 12/2015 40%. Echo 2/19 EF 40-45%. Echo 05/2018: EF 25-30% - NYHA I. - Volume status stable on exam.  - Continue torsemide 100 mg daily.   -Continue coreg 12.5 BID. - Continue hydralazine 25 mg BID.  - Continue Imdur 30 mg daily.  - He is not on Ace/Arb/spiro due to hyperkalemia/CKD - Repeat echo in 3-4 months with Dr Haroldine Laws.  Will schedule today.   2. HTN: - Stable.  3  Sickle Cell:   - Follows closely with S[facility] Cell Clinic.  4. Former smoker:   - Stopped 11/2015.  5. CKD stable IV:  - Followed by Dr. Lorrene Reid. No plans for dialysis at this time.   Has RUE Fistula.  6. PAF: s/p DCCV 05/2018 - This patients CHA2DS2-VASc is at least 3.  - Continue Eliquis 5 mg BID.  7. GI bleeding: - EGD showed nonbleeding gastritis, biopsies were obtained. Pathology was pending at discharge. Colonoscopy showed small internal hemorrhoids, otherwise normal.  - No recurrence on Eliquis.  8. ED - Requesting Cialis. Provided with script. If ED does not improve will refer to Urology.  - Discussed that  he needs to hold imdur on days he takes Cialsis.  9. Chest pain during admission. - Myoview 06/06/18: no significant ischemia, EF 46% - No further CP.  Follow up in April with Dr Haroldine Laws and an ECHO,.   [name], NP  07/22/2018

## 2018-07-24 ENCOUNTER — Encounter (HOSPITAL_COMMUNITY): Payer: PPO

## 2018-07-25 ENCOUNTER — Other Ambulatory Visit (HOSPITAL_COMMUNITY): Payer: Self-pay

## 2018-07-28 ENCOUNTER — Ambulatory Visit (HOSPITAL_COMMUNITY)
Admission: RE | Admit: 2018-07-28 | Discharge: 2018-07-28 | Disposition: A | Discharge status: Home / Self Care | Payer: PPO | Source: Ambulatory Visit | Attending: Nephrology | Admitting: Nephrology

## 2018-07-28 VITALS — BP 115/68 | HR 76 | Temp 98.6°F | Resp 20 | Ht 71.0 in | Wt 150.0 lb

## 2018-07-28 DIAGNOSIS — N184 Chronic kidney disease, stage 4 (severe): Secondary | ICD-10-CM | POA: Diagnosis not present

## 2018-07-28 LAB — PREPARE RBC (CROSSMATCH)

## 2018-07-28 MED ORDER — DARBEPOETIN ALFA 200 MCG/0.4ML IJ SOSY
PREFILLED_SYRINGE | INTRAMUSCULAR | Status: AC
  Administered 2018-07-28: 225 ug via SUBCUTANEOUS
  Filled 2018-07-28: qty 0.4

## 2018-07-28 MED ORDER — DARBEPOETIN ALFA 300 MCG/0.6ML IJ SOSY
225.0000 ug | PREFILLED_SYRINGE | INTRAMUSCULAR | Status: DC
  Administered 2018-07-28: 225 ug via SUBCUTANEOUS

## 2018-07-28 MED ORDER — DARBEPOETIN ALFA 25 MCG/0.42ML IJ SOSY
PREFILLED_SYRINGE | INTRAMUSCULAR | Status: AC
  Filled 2018-07-28: qty 0.42

## 2018-07-28 MED ORDER — SODIUM CHLORIDE 0.9% IV SOLUTION: Freq: Once | INTRAVENOUS | Status: DC

## 2018-07-29 LAB — BPAM RBC
Blood Product Expiration Date: 202002152359
ISSUE DATE / TIME: 202002100950
Unit Type and Rh: 5100

## 2018-07-29 LAB — TYPE AND SCREEN
ABO/RH(D): AB POS
Antibody Screen: NEGATIVE
Unit division: 0

## 2018-07-29 LAB — POCT HEMOGLOBIN-HEMACUE: Hemoglobin: 7.7 g/dL — ABNORMAL LOW (ref 13.0–17.0)

## 2018-07-29 MED FILL — Darbepoetin Alfa Soln Prefilled Syringe 25 MCG/0.42ML: INTRAMUSCULAR | Qty: 0.42 | Status: AC

## 2018-07-29 MED FILL — Darbepoetin Alfa Soln Prefilled Syringe 200 MCG/0.4ML: INTRAMUSCULAR | Qty: 0.4 | Status: AC

## 2018-07-31 ENCOUNTER — Ambulatory Visit: Payer: PPO | Admitting: Family Medicine

## 2018-08-11 ENCOUNTER — Ambulatory Visit (HOSPITAL_COMMUNITY)
Admission: RE | Admit: 2018-08-11 | Discharge: 2018-08-11 | Disposition: A | Discharge status: Home / Self Care | Payer: PPO | Source: Ambulatory Visit | Attending: Nephrology | Admitting: Nephrology

## 2018-08-11 VITALS — BP 150/80 | HR 92 | Temp 98.5°F | Resp 20

## 2018-08-11 DIAGNOSIS — N184 Chronic kidney disease, stage 4 (severe): Secondary | ICD-10-CM | POA: Diagnosis not present

## 2018-08-11 LAB — POCT HEMOGLOBIN-HEMACUE: Hemoglobin: 8.7 g/dL — ABNORMAL LOW (ref 13.0–17.0)

## 2018-08-11 LAB — IRON AND TIBC
Iron: 109 ug/dL (ref 45–182)
Saturation Ratios: 34 % (ref 17.9–39.5)
TIBC: 316 ug/dL (ref 250–450)
UIBC: 207 ug/dL

## 2018-08-11 LAB — FERRITIN: Ferritin: 25 ng/mL (ref 24–336)

## 2018-08-11 MED ORDER — DARBEPOETIN ALFA 25 MCG/0.42ML IJ SOSY
PREFILLED_SYRINGE | INTRAMUSCULAR | Status: AC
  Administered 2018-08-11: 25 ug via SUBCUTANEOUS
  Filled 2018-08-11: qty 0.42

## 2018-08-11 MED ORDER — DARBEPOETIN ALFA 25 MCG/0.42ML IJ SOSY: 25.0000 ug | PREFILLED_SYRINGE | Freq: Once | INTRAMUSCULAR | Status: DC

## 2018-08-11 MED ORDER — DARBEPOETIN ALFA 200 MCG/0.4ML IJ SOSY
PREFILLED_SYRINGE | INTRAMUSCULAR | Status: AC
  Filled 2018-08-11: qty 0.4

## 2018-08-11 MED ORDER — DARBEPOETIN ALFA 300 MCG/0.6ML IJ SOSY: 225.0000 ug | PREFILLED_SYRINGE | INTRAMUSCULAR | Status: DC

## 2018-08-11 MED ORDER — DARBEPOETIN ALFA 200 MCG/0.4ML IJ SOSY
200.0000 ug | PREFILLED_SYRINGE | Freq: Once | INTRAMUSCULAR | Status: AC
  Administered 2018-08-11: 200 ug via SUBCUTANEOUS

## 2018-08-25 ENCOUNTER — Encounter (HOSPITAL_COMMUNITY): Payer: PPO

## 2018-08-25 ENCOUNTER — Encounter (HOSPITAL_COMMUNITY)
Admission: RE | Admit: 2018-08-25 | Discharge: 2018-08-25 | Disposition: A | Discharge status: Home / Self Care | Payer: PPO | Source: Ambulatory Visit | Attending: Nephrology | Admitting: Nephrology

## 2018-08-25 VITALS — BP 124/79 | HR 81 | Temp 97.6°F | Resp 20

## 2018-08-25 DIAGNOSIS — N184 Chronic kidney disease, stage 4 (severe): Secondary | ICD-10-CM | POA: Diagnosis not present

## 2018-08-25 LAB — POCT HEMOGLOBIN-HEMACUE: Hemoglobin: 9.1 g/dL — ABNORMAL LOW (ref 13.0–17.0)

## 2018-08-25 MED ORDER — DARBEPOETIN ALFA 200 MCG/0.4ML IJ SOSY
PREFILLED_SYRINGE | INTRAMUSCULAR | Status: AC
  Administered 2018-08-25: 200 ug via SUBCUTANEOUS
  Filled 2018-08-25: qty 0.4

## 2018-08-25 MED ORDER — DARBEPOETIN ALFA 300 MCG/0.6ML IJ SOSY: 225.0000 ug | PREFILLED_SYRINGE | INTRAMUSCULAR | Status: DC

## 2018-08-25 MED ORDER — DARBEPOETIN ALFA 25 MCG/0.42ML IJ SOSY
PREFILLED_SYRINGE | INTRAMUSCULAR | Status: AC
  Administered 2018-08-25: 25 ug via SUBCUTANEOUS
  Filled 2018-08-25: qty 0.42

## 2018-08-29 ENCOUNTER — Ambulatory Visit: Payer: PPO | Admitting: Family Medicine

## 2018-09-04 ENCOUNTER — Other Ambulatory Visit: Payer: Self-pay | Admitting: Family Medicine

## 2018-09-04 ENCOUNTER — Telehealth: Payer: Self-pay

## 2018-09-04 DIAGNOSIS — D571 Sickle-cell disease without crisis: Secondary | ICD-10-CM

## 2018-09-04 MED ORDER — OXYCODONE HCL 5 MG PO TABS: 5.0000 mg | ORAL_TABLET | Freq: Four times a day (QID) | ORAL | 0 refills | Status: AC | PRN

## 2018-09-04 NOTE — Progress Notes (Signed)
Refilled

## 2018-09-08 ENCOUNTER — Ambulatory Visit (HOSPITAL_COMMUNITY)
Admission: RE | Admit: 2018-09-08 | Discharge: 2018-09-08 | Disposition: A | Discharge status: Home / Self Care | Payer: PPO | Source: Ambulatory Visit | Attending: Nephrology | Admitting: Nephrology

## 2018-09-08 ENCOUNTER — Encounter: Payer: Self-pay | Admitting: Family Medicine

## 2018-09-08 ENCOUNTER — Ambulatory Visit (INDEPENDENT_AMBULATORY_CARE_PROVIDER_SITE_OTHER): Payer: PPO | Admitting: Family Medicine

## 2018-09-08 ENCOUNTER — Other Ambulatory Visit: Payer: Self-pay

## 2018-09-08 VITALS — BP 132/83 | HR 83 | Temp 98.6°F | Resp 16 | Ht 71.0 in | Wt 148.6 lb

## 2018-09-08 VITALS — BP 130/77 | HR 87 | Temp 97.7°F | Resp 20

## 2018-09-08 DIAGNOSIS — M545 Low back pain, unspecified: Secondary | ICD-10-CM

## 2018-09-08 DIAGNOSIS — N184 Chronic kidney disease, stage 4 (severe): Secondary | ICD-10-CM

## 2018-09-08 DIAGNOSIS — D571 Sickle-cell disease without crisis: Secondary | ICD-10-CM

## 2018-09-08 DIAGNOSIS — I4891 Unspecified atrial fibrillation: Secondary | ICD-10-CM | POA: Diagnosis not present

## 2018-09-08 DIAGNOSIS — I5043 Acute on chronic combined systolic (congestive) and diastolic (congestive) heart failure: Secondary | ICD-10-CM | POA: Diagnosis not present

## 2018-09-08 LAB — POCT HEMOGLOBIN-HEMACUE: Hemoglobin: 9 g/dL — ABNORMAL LOW (ref 13.0–17.0)

## 2018-09-08 LAB — POCT URINALYSIS DIP (CLINITEK)
Bilirubin, UA: NEGATIVE
Glucose, UA: NEGATIVE mg/dL
Ketones, POC UA: NEGATIVE mg/dL
Leukocytes, UA: NEGATIVE
Nitrite, UA: NEGATIVE
POC PROTEIN,UA: >=300 — AB
Spec Grav, UA: 1.015 (ref 1.010–1.025)
Urobilinogen, UA: 0.2 E.U./dL
pH, UA: 7 (ref 5.0–8.0)

## 2018-09-08 LAB — IRON AND TIBC
Iron: 240 ug/dL — ABNORMAL HIGH (ref 45–182)
Saturation Ratios: 69 % — ABNORMAL HIGH (ref 17.9–39.5)
TIBC: 349 ug/dL (ref 250–450)
UIBC: 109 ug/dL

## 2018-09-08 LAB — FERRITIN: Ferritin: 85 ng/mL (ref 24–336)

## 2018-09-08 MED ORDER — DARBEPOETIN ALFA 200 MCG/0.4ML IJ SOSY
PREFILLED_SYRINGE | INTRAMUSCULAR | Status: AC
  Administered 2018-09-08: 200 ug via SUBCUTANEOUS
  Filled 2018-09-08: qty 0.4

## 2018-09-08 MED ORDER — DARBEPOETIN ALFA 25 MCG/0.42ML IJ SOSY
25.0000 ug | PREFILLED_SYRINGE | Freq: Once | INTRAMUSCULAR | Status: AC
  Administered 2018-09-08: 25 ug via SUBCUTANEOUS

## 2018-09-08 MED ORDER — METHYLPREDNISOLONE SODIUM SUCC 125 MG IJ SOLR
62.5000 mg | Freq: Once | INTRAMUSCULAR | Status: AC
  Administered 2018-09-08: 62.5 mg via INTRAMUSCULAR

## 2018-09-08 MED ORDER — DARBEPOETIN ALFA 25 MCG/0.42ML IJ SOSY
PREFILLED_SYRINGE | INTRAMUSCULAR | Status: AC
  Administered 2018-09-08: 25 ug via SUBCUTANEOUS
  Filled 2018-09-08: qty 0.42

## 2018-09-08 MED ORDER — DARBEPOETIN ALFA 200 MCG/0.4ML IJ SOSY
200.0000 ug | PREFILLED_SYRINGE | Freq: Once | INTRAMUSCULAR | Status: AC
  Administered 2018-09-08: 200 ug via SUBCUTANEOUS

## 2018-09-08 MED ORDER — DARBEPOETIN ALFA 300 MCG/0.6ML IJ SOSY: 225.0000 ug | PREFILLED_SYRINGE | INTRAMUSCULAR | Status: DC

## 2018-09-08 NOTE — Progress Notes (Signed)
Patient [facility]   Progress Note: General Provider: Lanae Boast, FNP  SUBJECTIVE:   Patrick [name] is a 63 y.o. male who  has a past medical history of Blood dyscrasia, Blood transfusion, CHF (congestive heart failure) ([place]), Chronic kidney disease (CKD), stage III (moderate) ([place]), Gout, Headache(784.0), Legally blind, Shortness of breath dyspnea, Sickle cell disease, type SS ([place]), Swelling abdomen, Swelling of extremity, left, and Swelling of extremity, right.. Patient presents today for Sickle Cell Anemia; Nasal Congestion; and Back Pain  Patient discharged from hospital on 06/06/2018 due to hypoxia and hemoglobin of 6.4. he is being seen by nephrology for end stage kidney disease. Patient has a history of end stage kidney disease. He has a right upper arm fistula placed, but has not started dialysis.  Patient with a hx of Afib. Started on eliquis 5 mg BID.  Patient seen by heart care on 06/17/2018 and will follow up with them in 4 weeks and instructed to have fluid restriction of less than 2L per day.   Review of Systems  Constitutional: Negative.   HENT: Negative.   Eyes: Negative.   Respiratory: Negative.   Cardiovascular: Negative.   Gastrointestinal: Negative.   Genitourinary: Negative.   Musculoskeletal: Negative.   Skin: Negative.   Neurological: Negative.   Psychiatric/Behavioral: Negative.      OBJECTIVE: BP 132/83   Pulse 83   Temp 98.6 F (37 C)   Resp 16   Ht 5\' 11"  (1.803 m)   Wt 148 lb 9.6 oz (67.4 kg)   SpO2 97%   BMI 20.73 kg/m   Wt Readings from Last 3 Encounters:  09/08/18 148 lb 9.6 oz (67.4 kg)  07/28/18 150 lb (68 kg)  07/22/18 148 lb (67.1 kg)     Physical Exam Vitals signs and nursing note reviewed.  Constitutional:      General: He is not in acute distress.    Appearance: He is well-developed.  HENT:     Head: Normocephalic and atraumatic.  Eyes:     Conjunctiva/sclera: Conjunctivae  normal.     Pupils: Pupils are equal, round, and reactive to light.  Neck:     Musculoskeletal: Normal range of motion.  Cardiovascular:     Rate and Rhythm: Normal rate and regular rhythm.     Heart sounds: Normal heart sounds.  Pulmonary:     Effort: Pulmonary effort is normal. No respiratory distress.     Breath sounds: Normal breath sounds.  Abdominal:     General: Bowel sounds are normal. There is no distension.     Palpations: Abdomen is soft.  Musculoskeletal:     Lumbar back: He exhibits decreased range of motion, bony tenderness and pain. He exhibits no spasm.  Skin:    General: Skin is warm and dry.  Neurological:     Mental Status: He is alert and oriented to person, place, and time.  Psychiatric:        Behavior: Behavior normal.        Thought Content: Thought content normal.     ASSESSMENT/PLAN:  1. Hb-SS disease without crisis [facility]) PMP shows that patient picked up his narcotics prior to this appointment.  - Urinalysis Dipstick - CBC with Differential - Basic Metabolic Panel  Chronic systolic congestive heart failure (HCC) Fluid restriction of 2 L per day. Followed by heart care.    Atrial fibrillation, unspecified type ([place]) Continue with eliquis.   End stage renal disease ([place]) Fistula placed.  Does not know when or if he will start dialysis.    Low back pain at multiple sites - methylPREDNISolone sodium succinate (SOLU-MEDROL) 125 mg/2 mL injection 62.5 mg - Ambulatory referral to Physical Therapy    Return in about 3 months (around 12/09/2018) for SCD.    The patient was given clear instructions to go to ER or return to medical center if symptoms do not improve, worsen or new problems develop. The patient verbalized understanding and agreed with plan of care.   Ms. Doug Sou. Nathaneil Canary, FNP-BC [facility] [address] [place], [place] [address] [phone]

## 2018-09-08 NOTE — Addendum Note (Signed)
Addended by: Horris Latino on: 09/08/2018 04:22 PM   Modules accepted: Orders

## 2018-09-08 NOTE — Patient Instructions (Signed)
Low Back Strain    A strain is a stretch or tear in a muscle or the strong cords of tissue that attach muscle to bone (tendons). Strains of the lower back (lumbar spine) are a common cause of low back pain. A strain occurs when muscles or tendons are torn or are stretched beyond their limits. The muscles may become inflamed, resulting in pain and sudden muscle tightening (spasms). A strain can happen suddenly due to an injury (trauma), or it can develop gradually due to overuse.  There are three types of strains:   Grade 1 is a mild strain involving a minor tear of the muscle fibers or tendons. This may cause some pain but no loss of muscle strength.   Grade 2 is a moderate strain involving a partial tear of the muscle fibers or tendons. This causes more severe pain and some loss of muscle strength.   Grade 3 is a severe strain involving a complete tear of the muscle or tendon. This causes severe pain and complete or nearly complete loss of muscle strength.  What are the causes?  This condition may be caused by:   Trauma, such as a fall or a hit to the body.   Twisting or overstretching the back. This may result from doing activities that require a lot of energy, such as lifting heavy objects.  What increases the risk?  The following factors may increase your risk of getting this condition:   Playing contact sports.   Participating in sports or activities that put excessive stress on the back and require a lot of bending and twisting, including:  ? Lifting weights or heavy objects.  ? Gymnastics.  ? Soccer.  ? Figure skating.  ? Snowboarding.   Being overweight or obese.   Having poor strength and flexibility.  What are the signs or symptoms?  Symptoms of this condition may include:   Sharp or dull pain in the lower back that does not go away. Pain may extend to the buttocks.   Stiffness.   Limited range of motion.   Inability to stand up straight due to stiffness or pain.   Muscle spasms.  How is this  diagnosed?  This condition may be diagnosed based on:   Your symptoms.   Your medical history.   A physical exam.  ? Your health care provider may push on certain areas of your back to determine the source of your pain.  ? You may be asked to bend forward, backward, and side to side to assess the severity of your pain and your range of motion.   Imaging tests, such as:  ? X-rays.  ? MRI.  How is this treated?  Treatment for this condition may include:   Applying heat and cold to the affected area.   Medicines to help relieve pain and to relax your muscles (muscle relaxants).   NSAIDs to help reduce swelling and discomfort.   Physical therapy.  When your symptoms improve, it is important to gradually return to your normal routine as soon as possible to reduce pain, avoid stiffness, and avoid loss of muscle strength. Generally, symptoms should improve within 6 weeks of treatment. However, recovery time varies.  Follow these instructions at home:  Managing pain, stiffness, and swelling   If directed, apply ice to the injured area during the first 24 hours after your injury.  ? Put ice in a plastic bag.  ? Place a towel between your skin and the bag.  ?   Place a towel between your skin and the heat source. ? Leave the heat on for 20-30 minutes. ? Remove the heat if your skin turns bright red. This is especially important if you are unable to feel pain, heat, or cold. You may have a greater risk of getting burned. Activity  Rest and return to your normal activities as told by your health care provider. Ask your health care provider what activities are safe for you.  Avoid activities that take a lot of effort (are strenuous) for as long as told by your  health care provider.  Do exercises as told by your health care provider. General instructions   Take over-the-counter and prescription medicines only as told by your health care provider.  If you have questions or concerns about safety while taking pain medicine, talk with your health care provider.  Do not drive or operate heavy machinery until you know how your pain medicine affects you.  Do not use any tobacco products, such as cigarettes, chewing tobacco, and e-cigarettes. Tobacco can delay bone healing. If you need help quitting, ask your health care provider.  Keep all follow-up visits as told by your health care provider. This is important. How is this prevented?               Warm up and stretch before being active.  Cool down and stretch after being active.  Give your body time to rest between periods of activity.  Avoid: ? Being physically inactive for long periods at a time. ? Exercising or playing sports when you are tired or in pain.  Use correct form when playing sports and lifting heavy objects.  Use good posture when sitting and standing.  Maintain a healthy weight.  Sleep on a mattress with medium firmness to support your back.  Make sure to use equipment that fits you, including shoes that fit well.  Be safe and responsible while being active to avoid falls.  Do at least 150 minutes of moderate-intensity exercise each week, such as brisk walking or water aerobics. Try a form of exercise that takes stress off your back, such as swimming or stationary cycling.  Maintain physical fitness, including: ? Strength. ? Flexibility. ? Cardiovascular fitness. ? Endurance. Contact a health care provider if:  Your back pain does not improve after 6 weeks of treatment.  Your symptoms get worse. Get help right away if:  Your back pain is severe.  You are unable to stand or walk.  You develop pain in your legs.  You develop weakness in your  buttocks or legs.  You have difficulty controlling when you urinate or when you have a bowel movement. This information is not intended to replace advice given to you by your health care provider. Make sure you discuss any questions you have with your health care provider. Document Released: 06/04/2005 Document Revised: 07/30/2016 Document Reviewed: 03/16/2015 Elsevier Interactive Patient Education  2019 [employer]. Core Strength Exercises  Core exercises help to build strength in the muscles between your ribs and your hips (abdominal muscles). These muscles help to support your body and keep your spine stable. It is important to maintain strength in your core to prevent injury and pain. Some activities, such as yoga and Pilates, can help to strengthen core muscles. You can also strengthen core muscles with exercises at home. It is important to talk to your health care provider before you start a new exercise routine. What are the benefits of core strength exercises?  Core strength exercises can:  Reduce back pain.  Help to rebuild strength after a back or spine injury.  Help to prevent injury during physical activity, especially injuries to the back and knees. How to do core strength exercises Repeat these exercises 10-15 times, or until you are tired. Do exercises exactly as told by your health care provider and adjust them as directed. It is normal to feel mild stretching, pulling, tightness, or discomfort as you do these exercises. If you feel any pain while doing these exercises, stop. If your pain continues or gets worse when doing core exercises, contact your health care provider. You may want to use a padded yoga or exercise mat for strength exercises that are done on the floor. Bridging  1. Lie on your back on a firm surface with your knees bent and your feet flat on the floor. 2. Raise your hips so that your knees, hips, and shoulders form a straight line together. Keep your  abdominal muscles tight. 3. Hold this position for 3-5 seconds. 4. Slowly lower your hips to the starting position. 5. Let your muscles relax completely between repetitions. Single-leg bridge 1. Lie on your back on a firm surface with your knees bent and your feet flat on the floor. 2. Raise your hips so that your knees, hips, and shoulders form a straight line together. Keep your abdominal muscles tight. 3. Lift one foot off the floor, then completely straighten that leg. 4. Hold this position for 3-5 seconds. 5. Put the straight leg back down in the bent position. 6. Slowly lower your hips to the starting position. 7. Repeat these steps using your other leg. Side bridge 1. Lie on your side with your knees bent. Prop yourself up on the elbow that is near the floor. 2. Using your abdominal muscles and your elbow that is on the floor, raise your body off the floor. Raise your hip so that your shoulder, hip, and foot form a straight line together. 3. Hold this position for 10 seconds. Keep your head and neck raised and away from your shoulder (in their normal, neutral position). Keep your abdominal muscles tight. 4. Slowly lower your hip to the starting position. 5. Repeat and try to hold this position longer, working your way up to 30 seconds. Abdominal crunch 1. Lie on your back on a firm surface. Bend your knees and keep your feet flat on the floor. 2. Cross your arms over your chest. 3. Without bending your neck, tip your chin slightly toward your chest. 4. Tighten your abdominal muscles as you lift your chest just high enough to lift your shoulder blades off of the floor. Do not hold your breath. You can do this with short lifts or long lifts. 5. Slowly return to the starting position. Bird dog 1. Get on your hands and knees, with your legs shoulder-width apart and your arms under your shoulders. Keep your back straight. 2. Tighten your abdominal muscles. 3. Raise one of your legs off  the floor and straighten it. Try to keep it parallel to the floor. 4. Slowly lower your leg to the starting position. 5. Raise one of your arms off the floor and straighten it. Try to keep it parallel to the floor. 6. Slowly lower your arm to the starting position. 7. Repeat with the other arm and leg. If possible, try raising a leg and arm at the same time, on opposite sides of the body. For example, raise your left hand and  your right leg. Plank 1. Lie on your belly. 2. Prop up your body onto your forearms and your feet, keeping your legs straight. Your body should make a straight line between your shoulders and feet. 3. Hold this position for 10 seconds while keeping your abdominal muscles tight. 4. Lower your body to the starting position. 5. Repeat and try to hold this position longer, working your way up to 30 seconds. Cross-core strengthening 1. Stand with your feet shoulder-width apart. 2. Hold a ball out in front of you. Keep your arms straight. 3. Tighten your abdominal muscles and slowly rotate at your waist from side to side. Keep your feet flat. 4. Once you are comfortable, try repeating this exercise with a heavier ball. Top core strengthening 1. Stand about 18 inches (46 cm) in front of a wall, with your back to the wall. 2. Keep your feet flat and shoulder-width apart. 3. Tighten your abdominal muscles. 4. Bend your hips and knees. 5. Slowly reach between your legs to touch the wall behind you. 6. Slowly stand back up. 7. Raise your arms over your head and reach behind you. 8. Return to the starting position. General tips  Do not do any exercises that cause pain. If you have pain while exercising, talk to your health care provider.  Always stretch before and after doing these exercises. This can help prevent injury.  Maintain a healthy weight. Ask your health care provider what weight is healthy for you. Contact a health care provider if:  You have back pain that  gets worse or does not go away.  You feel pain while doing core strength exercises. Get help right away if:  You have severe pain that does not get better with medicine. Summary  Core exercises help to build strength in the muscles between your ribs and your waist.  Core muscles help to support your body and keep your spine stable.  Some activities, such as yoga and Pilates, can help to strengthen core muscles.  Core strength exercises can help back pain and can prevent injury.  If you feel any pain while doing core strength exercises, stop. This information is not intended to replace advice given to you by your health care provider. Make sure you discuss any questions you have with your health care provider. Document Released: 10/24/2016 Document Revised: 10/24/2016 Document Reviewed: 10/24/2016 Elsevier Interactive Patient Education  2019 [employer].

## 2018-09-10 ENCOUNTER — Telehealth: Payer: Self-pay

## 2018-09-10 LAB — URINE CULTURE: Organism ID, Bacteria: NO GROWTH

## 2018-09-10 NOTE — Telephone Encounter (Signed)
Patient states he needs a note to return to work on Monday because they sent him home last week with cough and congestion.

## 2018-09-10 NOTE — Telephone Encounter (Signed)
Ted Mcalpine D, NT  You 1 hour ago (9:14 AM)   Pt is requesting a call back from the nurse about needing a note for work. Thanks!   Routing comment

## 2018-09-11 ENCOUNTER — Encounter: Payer: Self-pay | Admitting: Family Medicine

## 2018-09-11 NOTE — Telephone Encounter (Signed)
Patient wants a note stating that he is unable to go work due to some coughing, sneezing, and blowing his nose. Patient has been out of work since Wednesday 09/03/2018. Patient states that work took him out because of those symptoms. Patient wants the note to be out until the COV-19 clears.

## 2018-09-11 NOTE — Telephone Encounter (Signed)
Letter written and in my outbox.

## 2018-09-12 NOTE — Telephone Encounter (Signed)
Patient notified that letter is ready

## 2018-09-16 DIAGNOSIS — N2581 Secondary hyperparathyroidism of renal origin: Secondary | ICD-10-CM | POA: Diagnosis not present

## 2018-09-16 DIAGNOSIS — I4891 Unspecified atrial fibrillation: Secondary | ICD-10-CM | POA: Diagnosis not present

## 2018-09-16 DIAGNOSIS — I5020 Unspecified systolic (congestive) heart failure: Secondary | ICD-10-CM | POA: Diagnosis not present

## 2018-09-16 DIAGNOSIS — D631 Anemia in chronic kidney disease: Secondary | ICD-10-CM | POA: Diagnosis not present

## 2018-09-16 DIAGNOSIS — Z720 Tobacco use: Secondary | ICD-10-CM | POA: Diagnosis not present

## 2018-09-16 DIAGNOSIS — D571 Sickle-cell disease without crisis: Secondary | ICD-10-CM | POA: Diagnosis not present

## 2018-09-16 DIAGNOSIS — N185 Chronic kidney disease, stage 5: Secondary | ICD-10-CM | POA: Diagnosis not present

## 2018-09-16 DIAGNOSIS — I770 Arteriovenous fistula, acquired: Secondary | ICD-10-CM | POA: Diagnosis not present

## 2018-09-16 DIAGNOSIS — M109 Gout, unspecified: Secondary | ICD-10-CM | POA: Diagnosis not present

## 2018-09-16 DIAGNOSIS — I120 Hypertensive chronic kidney disease with stage 5 chronic kidney disease or end stage renal disease: Secondary | ICD-10-CM | POA: Diagnosis not present

## 2018-09-22 ENCOUNTER — Ambulatory Visit (HOSPITAL_COMMUNITY)
Admission: RE | Admit: 2018-09-22 | Discharge: 2018-09-22 | Disposition: A | Discharge status: Home / Self Care | Payer: PPO | Source: Ambulatory Visit | Attending: Nephrology | Admitting: Nephrology

## 2018-09-22 ENCOUNTER — Other Ambulatory Visit: Payer: Self-pay

## 2018-09-22 VITALS — BP 147/87 | HR 88 | Temp 97.8°F

## 2018-09-22 DIAGNOSIS — N184 Chronic kidney disease, stage 4 (severe): Secondary | ICD-10-CM | POA: Diagnosis not present

## 2018-09-22 LAB — POCT HEMOGLOBIN-HEMACUE: Hemoglobin: 9.2 g/dL — ABNORMAL LOW (ref 13.0–17.0)

## 2018-09-22 MED ORDER — DARBEPOETIN ALFA 300 MCG/0.6ML IJ SOSY: 225.0000 ug | PREFILLED_SYRINGE | INTRAMUSCULAR | Status: DC

## 2018-09-22 MED ORDER — DARBEPOETIN ALFA 200 MCG/0.4ML IJ SOSY
PREFILLED_SYRINGE | INTRAMUSCULAR | Status: AC
  Administered 2018-09-22: 200 ug
  Filled 2018-09-22: qty 0.4

## 2018-09-22 MED ORDER — DARBEPOETIN ALFA 25 MCG/0.42ML IJ SOSY
PREFILLED_SYRINGE | INTRAMUSCULAR | Status: AC
  Administered 2018-09-22: 25 ug
  Filled 2018-09-22: qty 0.42

## 2018-09-24 ENCOUNTER — Ambulatory Visit (INDEPENDENT_AMBULATORY_CARE_PROVIDER_SITE_OTHER): Payer: PPO | Admitting: Family Medicine

## 2018-09-24 ENCOUNTER — Other Ambulatory Visit: Payer: Self-pay

## 2018-09-24 ENCOUNTER — Telehealth: Payer: Self-pay

## 2018-09-24 DIAGNOSIS — M545 Low back pain, unspecified: Secondary | ICD-10-CM

## 2018-09-24 MED ORDER — OXYCODONE-ACETAMINOPHEN 7.5-325 MG PO TABS: 1.0000 | ORAL_TABLET | Freq: Three times a day (TID) | ORAL | 0 refills | Status: AC | PRN

## 2018-09-24 NOTE — Progress Notes (Signed)
  Patient [facility]  Virtual Visit via Telephone Note  I connected with Patrick [name] on 09/24/18 at 11:20 AM EDT by telephone and verified that I am speaking with the correct person using two identifiers.   I discussed the limitations, risks, security and privacy concerns of performing an evaluation and management service by telephone and the availability of in person appointments. I also discussed with the patient that there may be a patient responsible charge related to this service. The patient expressed understanding and agreed to proceed.   History of Present Illness: Joint/Muscle Pain: Patient complains of back pain for which has been present for 4 days. Pain is located in lower back and radiating to the left thigh. , is described as aching and stabbing, and is intermittent, moderate .  Associated symptoms include: decreased range of motion.  The patient has tried heating pad and tylenol. did not relieve. Made back itch for pain, with no relief.  Related to injury:   no.    Observations/Objective: Patient with regular voice tone, rate and rhythm. Speaking calmly and is in no apparent distress.    Assessment and Plan: 1. Low back pain at multiple sites - oxyCODONE-acetaminophen (PERCOCET) 7.5-325 MG tablet; Take 1 tablet by mouth every 8 (eight) hours as needed for up to 15 days for severe pain.  Dispense: 45 tablet; Refill: 0    Follow Up Instructions:    I discussed the assessment and treatment plan with the patient. The patient was provided an opportunity to ask questions and all were answered. The patient agreed with the plan and demonstrated an understanding of the instructions.   The patient was advised to call back or seek an in-person evaluation if the symptoms worsen or if the condition fails to improve as anticipated.  I provided 10  minutes of non-face-to-face time during this encounter.  Ms. Andr L. Nathaneil Canary, FNP-BC  [facility] [address] [place], [place] [address] [phone]

## 2018-10-06 ENCOUNTER — Other Ambulatory Visit: Payer: Self-pay

## 2018-10-06 ENCOUNTER — Encounter: Payer: Self-pay | Admitting: Family Medicine

## 2018-10-06 ENCOUNTER — Ambulatory Visit (HOSPITAL_COMMUNITY)
Admission: RE | Admit: 2018-10-06 | Discharge: 2018-10-06 | Disposition: A | Discharge status: Home / Self Care | Payer: PPO | Source: Ambulatory Visit | Attending: Nephrology | Admitting: Nephrology

## 2018-10-06 VITALS — BP 140/85 | HR 83 | Temp 98.2°F | Resp 18

## 2018-10-06 DIAGNOSIS — N184 Chronic kidney disease, stage 4 (severe): Secondary | ICD-10-CM | POA: Diagnosis not present

## 2018-10-06 LAB — IRON AND TIBC
Iron: 135 ug/dL (ref 45–182)
Saturation Ratios: 41 % — ABNORMAL HIGH (ref 17.9–39.5)
TIBC: 329 ug/dL (ref 250–450)
UIBC: 194 ug/dL

## 2018-10-06 LAB — FERRITIN: Ferritin: 56 ng/mL (ref 24–336)

## 2018-10-06 LAB — POCT HEMOGLOBIN-HEMACUE: Hemoglobin: 9.2 g/dL — ABNORMAL LOW (ref 13.0–17.0)

## 2018-10-06 MED ORDER — DARBEPOETIN ALFA 300 MCG/0.6ML IJ SOSY
225.0000 ug | PREFILLED_SYRINGE | INTRAMUSCULAR | Status: DC
  Administered 2018-10-06: 08:00:00 225 ug via SUBCUTANEOUS

## 2018-10-06 MED ORDER — DARBEPOETIN ALFA 25 MCG/0.42ML IJ SOSY
PREFILLED_SYRINGE | INTRAMUSCULAR | Status: AC
  Filled 2018-10-06: qty 0.42

## 2018-10-06 MED ORDER — DARBEPOETIN ALFA 200 MCG/0.4ML IJ SOSY
PREFILLED_SYRINGE | INTRAMUSCULAR | Status: AC
  Administered 2018-10-06: 225 ug via SUBCUTANEOUS
  Filled 2018-10-06: qty 0.4

## 2018-10-07 MED FILL — Darbepoetin Alfa Soln Prefilled Syringe 25 MCG/0.42ML: INTRAMUSCULAR | Qty: 0.42 | Status: AC

## 2018-10-07 MED FILL — Darbepoetin Alfa Soln Prefilled Syringe 200 MCG/0.4ML: INTRAMUSCULAR | Qty: 0.4 | Status: AC

## 2018-10-14 NOTE — Telephone Encounter (Signed)
Message sent to provider 

## 2018-10-15 ENCOUNTER — Other Ambulatory Visit (HOSPITAL_COMMUNITY): Payer: PPO

## 2018-10-15 ENCOUNTER — Encounter (HOSPITAL_COMMUNITY): Payer: PPO | Admitting: Internal Medicine

## 2018-10-17 ENCOUNTER — Other Ambulatory Visit: Payer: Self-pay

## 2018-10-20 ENCOUNTER — Other Ambulatory Visit: Payer: Self-pay

## 2018-10-20 ENCOUNTER — Ambulatory Visit (HOSPITAL_COMMUNITY)
Admission: RE | Admit: 2018-10-20 | Discharge: 2018-10-20 | Disposition: A | Discharge status: Home / Self Care | Payer: PPO | Source: Ambulatory Visit | Attending: Nephrology | Admitting: Nephrology

## 2018-10-20 VITALS — BP 142/82 | HR 91 | Temp 98.1°F | Resp 18

## 2018-10-20 DIAGNOSIS — N184 Chronic kidney disease, stage 4 (severe): Secondary | ICD-10-CM | POA: Diagnosis not present

## 2018-10-20 LAB — POCT HEMOGLOBIN-HEMACUE: Hemoglobin: 9.5 g/dL — ABNORMAL LOW (ref 13.0–17.0)

## 2018-10-20 MED ORDER — DARBEPOETIN ALFA 25 MCG/0.42ML IJ SOSY
25.0000 ug | PREFILLED_SYRINGE | Freq: Once | INTRAMUSCULAR | Status: AC
  Administered 2018-10-20: 08:00:00 25 ug via SUBCUTANEOUS

## 2018-10-20 MED ORDER — DARBEPOETIN ALFA 200 MCG/0.4ML IJ SOSY
PREFILLED_SYRINGE | INTRAMUSCULAR | Status: AC
  Administered 2018-10-20: 200 ug via SUBCUTANEOUS
  Filled 2018-10-20: qty 0.4

## 2018-10-20 MED ORDER — DARBEPOETIN ALFA 200 MCG/0.4ML IJ SOSY
200.0000 ug | PREFILLED_SYRINGE | Freq: Once | INTRAMUSCULAR | Status: AC
  Administered 2018-10-20: 08:00:00 200 ug via SUBCUTANEOUS

## 2018-10-20 MED ORDER — DARBEPOETIN ALFA 25 MCG/0.42ML IJ SOSY
PREFILLED_SYRINGE | INTRAMUSCULAR | Status: AC
  Administered 2018-10-20: 25 ug via SUBCUTANEOUS
  Filled 2018-10-20: qty 0.42

## 2018-10-20 MED ORDER — DARBEPOETIN ALFA 300 MCG/0.6ML IJ SOSY: 225.0000 ug | PREFILLED_SYRINGE | INTRAMUSCULAR | Status: DC

## 2018-10-21 DIAGNOSIS — D571 Sickle-cell disease without crisis: Secondary | ICD-10-CM | POA: Diagnosis not present

## 2018-10-21 DIAGNOSIS — I5020 Unspecified systolic (congestive) heart failure: Secondary | ICD-10-CM | POA: Diagnosis not present

## 2018-10-21 DIAGNOSIS — M109 Gout, unspecified: Secondary | ICD-10-CM | POA: Diagnosis not present

## 2018-10-21 DIAGNOSIS — Z720 Tobacco use: Secondary | ICD-10-CM | POA: Diagnosis not present

## 2018-10-21 DIAGNOSIS — N2581 Secondary hyperparathyroidism of renal origin: Secondary | ICD-10-CM | POA: Diagnosis not present

## 2018-10-21 DIAGNOSIS — D631 Anemia in chronic kidney disease: Secondary | ICD-10-CM | POA: Diagnosis not present

## 2018-10-21 DIAGNOSIS — N185 Chronic kidney disease, stage 5: Secondary | ICD-10-CM | POA: Diagnosis not present

## 2018-10-21 DIAGNOSIS — I120 Hypertensive chronic kidney disease with stage 5 chronic kidney disease or end stage renal disease: Secondary | ICD-10-CM | POA: Diagnosis not present

## 2018-10-21 DIAGNOSIS — I770 Arteriovenous fistula, acquired: Secondary | ICD-10-CM | POA: Diagnosis not present

## 2018-10-21 DIAGNOSIS — I4891 Unspecified atrial fibrillation: Secondary | ICD-10-CM | POA: Diagnosis not present

## 2018-11-03 ENCOUNTER — Other Ambulatory Visit: Payer: Self-pay

## 2018-11-03 ENCOUNTER — Ambulatory Visit (HOSPITAL_COMMUNITY)
Admission: RE | Admit: 2018-11-03 | Discharge: 2018-11-03 | Disposition: A | Discharge status: Home / Self Care | Payer: PPO | Source: Ambulatory Visit | Attending: Nephrology | Admitting: Nephrology

## 2018-11-03 VITALS — BP 134/79 | HR 92 | Temp 97.8°F | Resp 18

## 2018-11-03 DIAGNOSIS — N184 Chronic kidney disease, stage 4 (severe): Secondary | ICD-10-CM | POA: Insufficient documentation

## 2018-11-03 LAB — FERRITIN: Ferritin: 49 ng/mL (ref 24–336)

## 2018-11-03 LAB — POCT HEMOGLOBIN-HEMACUE: Hemoglobin: 8.1 g/dL — ABNORMAL LOW (ref 13.0–17.0)

## 2018-11-03 LAB — IRON AND TIBC
Iron: 142 ug/dL (ref 45–182)
Saturation Ratios: 45 % — ABNORMAL HIGH (ref 17.9–39.5)
TIBC: 314 ug/dL (ref 250–450)
UIBC: 172 ug/dL

## 2018-11-03 MED ORDER — DARBEPOETIN ALFA 25 MCG/0.42ML IJ SOSY: 25.0000 ug | PREFILLED_SYRINGE | Freq: Once | INTRAMUSCULAR | Status: DC

## 2018-11-03 MED ORDER — DARBEPOETIN ALFA 200 MCG/0.4ML IJ SOSY
PREFILLED_SYRINGE | INTRAMUSCULAR | Status: AC
  Administered 2018-11-03: 200 ug via SUBCUTANEOUS
  Filled 2018-11-03: qty 0.4

## 2018-11-03 MED ORDER — DARBEPOETIN ALFA 200 MCG/0.4ML IJ SOSY: 200.0000 ug | PREFILLED_SYRINGE | Freq: Once | INTRAMUSCULAR | Status: DC

## 2018-11-03 MED ORDER — DARBEPOETIN ALFA 300 MCG/0.6ML IJ SOSY: 225.0000 ug | PREFILLED_SYRINGE | INTRAMUSCULAR | Status: DC

## 2018-11-03 MED ORDER — DARBEPOETIN ALFA 25 MCG/0.42ML IJ SOSY
PREFILLED_SYRINGE | INTRAMUSCULAR | Status: AC
  Administered 2018-11-03: 25 ug via SUBCUTANEOUS
  Filled 2018-11-03: qty 0.42

## 2018-11-17 ENCOUNTER — Other Ambulatory Visit (HOSPITAL_COMMUNITY): Payer: Self-pay | Admitting: Internal Medicine

## 2018-11-17 ENCOUNTER — Encounter (HOSPITAL_COMMUNITY)
Admission: RE | Admit: 2018-11-17 | Discharge: 2018-11-17 | Disposition: A | Discharge status: Home / Self Care | Payer: PPO | Source: Ambulatory Visit | Attending: Nephrology | Admitting: Nephrology

## 2018-11-17 ENCOUNTER — Other Ambulatory Visit: Payer: Self-pay

## 2018-11-17 VITALS — BP 108/76 | HR 106 | Temp 97.4°F | Resp 18

## 2018-11-17 DIAGNOSIS — N184 Chronic kidney disease, stage 4 (severe): Secondary | ICD-10-CM | POA: Insufficient documentation

## 2018-11-17 LAB — POCT HEMOGLOBIN-HEMACUE: Hemoglobin: 8.6 g/dL — ABNORMAL LOW (ref 13.0–17.0)

## 2018-11-17 MED ORDER — DARBEPOETIN ALFA 200 MCG/0.4ML IJ SOSY
PREFILLED_SYRINGE | INTRAMUSCULAR | Status: AC
  Administered 2018-11-17: 200 ug
  Filled 2018-11-17: qty 0.4

## 2018-11-17 MED ORDER — DARBEPOETIN ALFA 25 MCG/0.42ML IJ SOSY
PREFILLED_SYRINGE | INTRAMUSCULAR | Status: AC
  Administered 2018-11-17: 25 ug
  Filled 2018-11-17: qty 0.42

## 2018-11-17 MED ORDER — DARBEPOETIN ALFA 300 MCG/0.6ML IJ SOSY: 225.0000 ug | PREFILLED_SYRINGE | INTRAMUSCULAR | Status: DC

## 2018-11-26 ENCOUNTER — Telehealth: Payer: Self-pay

## 2018-11-26 DIAGNOSIS — D571 Sickle-cell disease without crisis: Secondary | ICD-10-CM

## 2018-11-28 ENCOUNTER — Telehealth: Payer: Self-pay

## 2018-11-28 MED ORDER — SILDENAFIL CITRATE 50 MG PO TABS: 50.0000 mg | ORAL_TABLET | Freq: Every day | ORAL | 0 refills | Status: DC | PRN

## 2018-11-28 MED ORDER — OXYCODONE HCL 5 MG PO TABS: 5.0000 mg | ORAL_TABLET | Freq: Four times a day (QID) | ORAL | 0 refills | Status: DC | PRN

## 2018-11-28 NOTE — Telephone Encounter (Signed)
refilled 

## 2018-11-28 NOTE — Telephone Encounter (Signed)
Patrick [name] Please advise on oxycodone refill. I have sent in viagra today. Thanks!

## 2018-11-29 ENCOUNTER — Other Ambulatory Visit (HOSPITAL_COMMUNITY): Payer: Self-pay | Admitting: Internal Medicine

## 2018-12-01 ENCOUNTER — Other Ambulatory Visit: Payer: Self-pay

## 2018-12-01 ENCOUNTER — Ambulatory Visit (HOSPITAL_COMMUNITY)
Admission: RE | Admit: 2018-12-01 | Discharge: 2018-12-01 | Disposition: A | Discharge status: Home / Self Care | Payer: PPO | Source: Ambulatory Visit | Attending: Nephrology | Admitting: Nephrology

## 2018-12-01 VITALS — BP 123/88 | HR 95 | Temp 97.8°F | Resp 18

## 2018-12-01 DIAGNOSIS — N184 Chronic kidney disease, stage 4 (severe): Secondary | ICD-10-CM | POA: Diagnosis not present

## 2018-12-01 LAB — FERRITIN: Ferritin: 28 ng/mL (ref 24–336)

## 2018-12-01 LAB — IRON AND TIBC
Iron: 181 ug/dL (ref 45–182)
Saturation Ratios: 54 % — ABNORMAL HIGH (ref 17.9–39.5)
TIBC: 337 ug/dL (ref 250–450)
UIBC: 156 ug/dL

## 2018-12-01 LAB — POCT HEMOGLOBIN-HEMACUE: Hemoglobin: 8.4 g/dL — ABNORMAL LOW (ref 13.0–17.0)

## 2018-12-01 MED ORDER — DARBEPOETIN ALFA 25 MCG/0.42ML IJ SOSY: 25.0000 ug | PREFILLED_SYRINGE | Freq: Once | INTRAMUSCULAR | Status: DC

## 2018-12-01 MED ORDER — DARBEPOETIN ALFA 200 MCG/0.4ML IJ SOSY
PREFILLED_SYRINGE | INTRAMUSCULAR | Status: AC
  Administered 2018-12-01: 200 ug via SUBCUTANEOUS
  Filled 2018-12-01: qty 0.4

## 2018-12-01 MED ORDER — DARBEPOETIN ALFA 25 MCG/0.42ML IJ SOSY
PREFILLED_SYRINGE | INTRAMUSCULAR | Status: AC
  Administered 2018-12-01: 25 ug via SUBCUTANEOUS
  Filled 2018-12-01: qty 0.42

## 2018-12-01 MED ORDER — DARBEPOETIN ALFA 200 MCG/0.4ML IJ SOSY: 200.0000 ug | PREFILLED_SYRINGE | Freq: Once | INTRAMUSCULAR | Status: DC

## 2018-12-01 MED ORDER — DARBEPOETIN ALFA 300 MCG/0.6ML IJ SOSY: 225.0000 ug | PREFILLED_SYRINGE | INTRAMUSCULAR | Status: DC

## 2018-12-08 ENCOUNTER — Telehealth (HOSPITAL_COMMUNITY): Payer: Self-pay | Admitting: *Deleted

## 2018-12-08 NOTE — Telephone Encounter (Signed)
   I called him regarding increased weight gain and leg edema.   He reports 3 pound weight gain and increased shortness of breath.   He was instructed to take an extra 50 mg torsemide for the next 2 days.   I have asked him to call the clinic back if weight does not go down. He verbalized understanding and was appreciative of the call. Warren Lacy Amiee Wiley Np-C  11:31 AM

## 2018-12-08 NOTE — Telephone Encounter (Signed)
Pt left VM stating his weight is up, he is more short of breath, and has swelling in his abdomen. Patient wants to know if he can increase diuretics or does he need an office visit?  Message routed to [place] for advice.

## 2018-12-09 ENCOUNTER — Telehealth: Payer: Self-pay

## 2018-12-09 NOTE — Telephone Encounter (Signed)
Called to do COVID Screening for appointment tomorrow. No answer. Left a message to call back. Thanks!

## 2018-12-10 ENCOUNTER — Ambulatory Visit: Payer: PPO | Admitting: Family Medicine

## 2018-12-15 ENCOUNTER — Other Ambulatory Visit: Payer: Self-pay

## 2018-12-15 ENCOUNTER — Ambulatory Visit (HOSPITAL_COMMUNITY)
Admission: RE | Admit: 2018-12-15 | Discharge: 2018-12-15 | Disposition: A | Discharge status: Home / Self Care | Payer: PPO | Source: Ambulatory Visit | Attending: Nephrology | Admitting: Nephrology

## 2018-12-15 VITALS — BP 123/92 | HR 80 | Temp 97.8°F | Resp 18

## 2018-12-15 DIAGNOSIS — N184 Chronic kidney disease, stage 4 (severe): Secondary | ICD-10-CM | POA: Insufficient documentation

## 2018-12-15 LAB — POCT HEMOGLOBIN-HEMACUE: Hemoglobin: 10 g/dL — ABNORMAL LOW (ref 13.0–17.0)

## 2018-12-15 MED ORDER — DARBEPOETIN ALFA 200 MCG/0.4ML IJ SOSY
PREFILLED_SYRINGE | INTRAMUSCULAR | Status: AC
  Administered 2018-12-15: 200 ug via SUBCUTANEOUS
  Filled 2018-12-15: qty 0.4

## 2018-12-15 MED ORDER — DARBEPOETIN ALFA 25 MCG/0.42ML IJ SOSY
PREFILLED_SYRINGE | INTRAMUSCULAR | Status: AC
  Administered 2018-12-15: 25 ug via SUBCUTANEOUS
  Filled 2018-12-15: qty 0.42

## 2018-12-15 MED ORDER — DARBEPOETIN ALFA 300 MCG/0.6ML IJ SOSY: 225.0000 ug | PREFILLED_SYRINGE | INTRAMUSCULAR | Status: DC

## 2018-12-29 ENCOUNTER — Ambulatory Visit (HOSPITAL_COMMUNITY)
Admission: RE | Admit: 2018-12-29 | Discharge: 2018-12-29 | Disposition: A | Discharge status: Home / Self Care | Payer: PPO | Source: Ambulatory Visit | Attending: Nephrology | Admitting: Nephrology

## 2018-12-29 ENCOUNTER — Other Ambulatory Visit: Payer: Self-pay

## 2018-12-29 VITALS — BP 127/75 | HR 80 | Temp 97.7°F | Resp 18

## 2018-12-29 DIAGNOSIS — N184 Chronic kidney disease, stage 4 (severe): Secondary | ICD-10-CM | POA: Diagnosis not present

## 2018-12-29 LAB — IRON AND TIBC
Iron: 65 ug/dL (ref 45–182)
Saturation Ratios: 21 % (ref 17.9–39.5)
TIBC: 314 ug/dL (ref 250–450)
UIBC: 249 ug/dL

## 2018-12-29 LAB — POCT HEMOGLOBIN-HEMACUE: Hemoglobin: 8.7 g/dL — ABNORMAL LOW (ref 13.0–17.0)

## 2018-12-29 LAB — FERRITIN: Ferritin: 33 ng/mL (ref 24–336)

## 2018-12-29 MED ORDER — DARBEPOETIN ALFA 25 MCG/0.42ML IJ SOSY
PREFILLED_SYRINGE | INTRAMUSCULAR | Status: AC
  Administered 2018-12-29: 25 ug via SUBCUTANEOUS
  Filled 2018-12-29: qty 0.42

## 2018-12-29 MED ORDER — DARBEPOETIN ALFA 300 MCG/0.6ML IJ SOSY: 225.0000 ug | PREFILLED_SYRINGE | INTRAMUSCULAR | Status: DC

## 2018-12-29 MED ORDER — DARBEPOETIN ALFA 200 MCG/0.4ML IJ SOSY
PREFILLED_SYRINGE | INTRAMUSCULAR | Status: AC
  Administered 2018-12-29: 200 ug via SUBCUTANEOUS
  Filled 2018-12-29: qty 0.4

## 2019-01-05 ENCOUNTER — Encounter (HOSPITAL_COMMUNITY): Payer: PPO

## 2019-01-12 ENCOUNTER — Other Ambulatory Visit: Payer: Self-pay

## 2019-01-12 ENCOUNTER — Ambulatory Visit (HOSPITAL_COMMUNITY)
Admission: RE | Admit: 2019-01-12 | Discharge: 2019-01-12 | Disposition: A | Discharge status: Home / Self Care | Payer: PPO | Source: Ambulatory Visit | Attending: Nephrology | Admitting: Nephrology

## 2019-01-12 ENCOUNTER — Telehealth: Payer: Self-pay | Admitting: Family Medicine

## 2019-01-12 VITALS — BP 126/85 | HR 86 | Temp 97.2°F | Resp 18

## 2019-01-12 DIAGNOSIS — N184 Chronic kidney disease, stage 4 (severe): Secondary | ICD-10-CM | POA: Diagnosis not present

## 2019-01-12 DIAGNOSIS — D571 Sickle-cell disease without crisis: Secondary | ICD-10-CM

## 2019-01-12 LAB — POCT HEMOGLOBIN-HEMACUE: Hemoglobin: 8.4 g/dL — ABNORMAL LOW (ref 13.0–17.0)

## 2019-01-12 MED ORDER — DARBEPOETIN ALFA 300 MCG/0.6ML IJ SOSY: 225.0000 ug | PREFILLED_SYRINGE | INTRAMUSCULAR | Status: DC

## 2019-01-12 MED ORDER — DARBEPOETIN ALFA 25 MCG/0.42ML IJ SOSY
PREFILLED_SYRINGE | INTRAMUSCULAR | Status: AC
  Administered 2019-01-12: 25 ug
  Filled 2019-01-12: qty 0.42

## 2019-01-12 MED ORDER — OXYCODONE HCL 5 MG PO TABS: 5.0000 mg | ORAL_TABLET | Freq: Four times a day (QID) | ORAL | 0 refills | Status: DC | PRN

## 2019-01-12 MED ORDER — DARBEPOETIN ALFA 200 MCG/0.4ML IJ SOSY
PREFILLED_SYRINGE | INTRAMUSCULAR | Status: AC
  Administered 2019-01-12: 200 ug
  Filled 2019-01-12: qty 0.4

## 2019-01-12 NOTE — Telephone Encounter (Signed)
Reviewed [facility] Substance Reporting system prior to prescribing opiate medications. No inconsistencies noted.

## 2019-01-26 ENCOUNTER — Other Ambulatory Visit: Payer: Self-pay

## 2019-01-26 ENCOUNTER — Ambulatory Visit (HOSPITAL_COMMUNITY)
Admission: RE | Admit: 2019-01-26 | Discharge: 2019-01-26 | Disposition: A | Discharge status: Home / Self Care | Payer: PPO | Source: Ambulatory Visit | Attending: Nephrology | Admitting: Nephrology

## 2019-01-26 VITALS — BP 131/76 | HR 96 | Temp 97.8°F | Resp 18

## 2019-01-26 DIAGNOSIS — N184 Chronic kidney disease, stage 4 (severe): Secondary | ICD-10-CM | POA: Diagnosis not present

## 2019-01-26 LAB — IRON AND TIBC
Iron: 67 ug/dL (ref 45–182)
Saturation Ratios: 21 % (ref 17.9–39.5)
TIBC: 319 ug/dL (ref 250–450)
UIBC: 252 ug/dL

## 2019-01-26 LAB — POCT HEMOGLOBIN-HEMACUE: Hemoglobin: 8 g/dL — ABNORMAL LOW (ref 13.0–17.0)

## 2019-01-26 LAB — FERRITIN: Ferritin: 32 ng/mL (ref 24–336)

## 2019-01-26 MED ORDER — DARBEPOETIN ALFA 300 MCG/0.6ML IJ SOSY
225.0000 ug | PREFILLED_SYRINGE | INTRAMUSCULAR | Status: DC
  Administered 2019-01-26: 200 ug via SUBCUTANEOUS

## 2019-01-26 MED ORDER — DARBEPOETIN ALFA 200 MCG/0.4ML IJ SOSY
PREFILLED_SYRINGE | INTRAMUSCULAR | Status: AC
  Filled 2019-01-26: qty 0.4

## 2019-01-26 MED ORDER — DARBEPOETIN ALFA 25 MCG/0.42ML IJ SOSY
PREFILLED_SYRINGE | INTRAMUSCULAR | Status: AC
  Administered 2019-01-26: 25 ug via SUBCUTANEOUS
  Filled 2019-01-26: qty 0.42

## 2019-01-28 DIAGNOSIS — I5020 Unspecified systolic (congestive) heart failure: Secondary | ICD-10-CM | POA: Diagnosis not present

## 2019-01-28 DIAGNOSIS — Z720 Tobacco use: Secondary | ICD-10-CM | POA: Diagnosis not present

## 2019-01-28 DIAGNOSIS — D631 Anemia in chronic kidney disease: Secondary | ICD-10-CM | POA: Diagnosis not present

## 2019-01-28 DIAGNOSIS — I120 Hypertensive chronic kidney disease with stage 5 chronic kidney disease or end stage renal disease: Secondary | ICD-10-CM | POA: Diagnosis not present

## 2019-01-28 DIAGNOSIS — M109 Gout, unspecified: Secondary | ICD-10-CM | POA: Diagnosis not present

## 2019-01-28 DIAGNOSIS — I4891 Unspecified atrial fibrillation: Secondary | ICD-10-CM | POA: Diagnosis not present

## 2019-01-28 DIAGNOSIS — N185 Chronic kidney disease, stage 5: Secondary | ICD-10-CM | POA: Diagnosis not present

## 2019-01-28 DIAGNOSIS — I770 Arteriovenous fistula, acquired: Secondary | ICD-10-CM | POA: Diagnosis not present

## 2019-01-28 DIAGNOSIS — N2581 Secondary hyperparathyroidism of renal origin: Secondary | ICD-10-CM | POA: Diagnosis not present

## 2019-01-28 DIAGNOSIS — D571 Sickle-cell disease without crisis: Secondary | ICD-10-CM | POA: Diagnosis not present

## 2019-02-09 ENCOUNTER — Other Ambulatory Visit: Payer: Self-pay

## 2019-02-09 ENCOUNTER — Ambulatory Visit (HOSPITAL_COMMUNITY)
Admission: RE | Admit: 2019-02-09 | Discharge: 2019-02-09 | Disposition: A | Discharge status: Home / Self Care | Payer: PPO | Source: Ambulatory Visit | Attending: Nephrology | Admitting: Nephrology

## 2019-02-09 VITALS — BP 116/67 | HR 90 | Temp 97.1°F | Resp 18

## 2019-02-09 DIAGNOSIS — N184 Chronic kidney disease, stage 4 (severe): Secondary | ICD-10-CM | POA: Diagnosis not present

## 2019-02-09 LAB — POCT HEMOGLOBIN-HEMACUE: Hemoglobin: 8.6 g/dL — ABNORMAL LOW (ref 13.0–17.0)

## 2019-02-09 MED ORDER — DARBEPOETIN ALFA 25 MCG/0.42ML IJ SOSY
PREFILLED_SYRINGE | INTRAMUSCULAR | Status: AC
  Administered 2019-02-09: 25 ug via SUBCUTANEOUS
  Filled 2019-02-09: qty 0.42

## 2019-02-09 MED ORDER — DARBEPOETIN ALFA 300 MCG/0.6ML IJ SOSY: 225.0000 ug | PREFILLED_SYRINGE | INTRAMUSCULAR | Status: DC

## 2019-02-09 MED ORDER — DARBEPOETIN ALFA 200 MCG/0.4ML IJ SOSY
PREFILLED_SYRINGE | INTRAMUSCULAR | Status: AC
  Administered 2019-02-09: 200 ug via SUBCUTANEOUS
  Filled 2019-02-09: qty 0.4

## 2019-02-10 ENCOUNTER — Telehealth: Payer: Self-pay

## 2019-02-10 DIAGNOSIS — D571 Sickle-cell disease without crisis: Secondary | ICD-10-CM

## 2019-02-12 ENCOUNTER — Other Ambulatory Visit: Payer: Self-pay | Admitting: Internal Medicine

## 2019-02-12 DIAGNOSIS — D571 Sickle-cell disease without crisis: Secondary | ICD-10-CM

## 2019-02-12 MED ORDER — OXYCODONE HCL 5 MG PO TABS: 5.0000 mg | ORAL_TABLET | Freq: Four times a day (QID) | ORAL | 0 refills | Status: DC | PRN

## 2019-02-12 NOTE — Telephone Encounter (Signed)
Refilled

## 2019-02-24 ENCOUNTER — Other Ambulatory Visit: Payer: Self-pay

## 2019-02-24 ENCOUNTER — Ambulatory Visit (HOSPITAL_COMMUNITY)
Admission: RE | Admit: 2019-02-24 | Discharge: 2019-02-24 | Disposition: A | Discharge status: Home / Self Care | Payer: PPO | Source: Ambulatory Visit | Attending: Nephrology | Admitting: Nephrology

## 2019-02-24 VITALS — BP 117/78 | HR 75 | Temp 97.3°F | Resp 20

## 2019-02-24 DIAGNOSIS — N184 Chronic kidney disease, stage 4 (severe): Secondary | ICD-10-CM | POA: Diagnosis not present

## 2019-02-24 LAB — FERRITIN: Ferritin: 36 ng/mL (ref 24–336)

## 2019-02-24 LAB — IRON AND TIBC
Iron: 80 ug/dL (ref 45–182)
Saturation Ratios: 23 % (ref 17.9–39.5)
TIBC: 342 ug/dL (ref 250–450)
UIBC: 262 ug/dL

## 2019-02-24 LAB — POCT HEMOGLOBIN-HEMACUE: Hemoglobin: 8 g/dL — ABNORMAL LOW (ref 13.0–17.0)

## 2019-02-24 MED ORDER — DARBEPOETIN ALFA 300 MCG/0.6ML IJ SOSY: 225.0000 ug | PREFILLED_SYRINGE | INTRAMUSCULAR | Status: DC

## 2019-02-24 MED ORDER — DARBEPOETIN ALFA 25 MCG/0.42ML IJ SOSY
PREFILLED_SYRINGE | INTRAMUSCULAR | Status: AC
  Administered 2019-02-24: 25 ug
  Filled 2019-02-24: qty 0.42

## 2019-02-24 MED ORDER — DARBEPOETIN ALFA 200 MCG/0.4ML IJ SOSY
PREFILLED_SYRINGE | INTRAMUSCULAR | Status: AC
  Administered 2019-02-24: 200 ug
  Filled 2019-02-24: qty 0.4

## 2019-03-10 ENCOUNTER — Other Ambulatory Visit (HOSPITAL_COMMUNITY): Payer: Self-pay

## 2019-03-10 ENCOUNTER — Other Ambulatory Visit: Payer: Self-pay

## 2019-03-10 ENCOUNTER — Encounter (HOSPITAL_COMMUNITY)
Admission: RE | Admit: 2019-03-10 | Discharge: 2019-03-10 | Disposition: A | Discharge status: Home / Self Care | Payer: PPO | Source: Ambulatory Visit | Attending: Nephrology | Admitting: Nephrology

## 2019-03-10 VITALS — BP 116/84 | HR 96 | Temp 97.9°F | Resp 18

## 2019-03-10 DIAGNOSIS — N184 Chronic kidney disease, stage 4 (severe): Secondary | ICD-10-CM | POA: Diagnosis not present

## 2019-03-10 LAB — POCT HEMOGLOBIN-HEMACUE: Hemoglobin: 7.8 g/dL — ABNORMAL LOW (ref 13.0–17.0)

## 2019-03-10 MED ORDER — DARBEPOETIN ALFA 300 MCG/0.6ML IJ SOSY: 225.0000 ug | PREFILLED_SYRINGE | INTRAMUSCULAR | Status: DC

## 2019-03-10 MED ORDER — DARBEPOETIN ALFA 25 MCG/0.42ML IJ SOSY
PREFILLED_SYRINGE | INTRAMUSCULAR | Status: AC
  Administered 2019-03-10: 25 ug via SUBCUTANEOUS
  Filled 2019-03-10: qty 0.42

## 2019-03-10 MED ORDER — ISOSORBIDE MONONITRATE ER 30 MG PO TB24: 30.0000 mg | ORAL_TABLET | Freq: Every day | ORAL | 2 refills | Status: DC

## 2019-03-10 MED ORDER — DARBEPOETIN ALFA 200 MCG/0.4ML IJ SOSY
PREFILLED_SYRINGE | INTRAMUSCULAR | Status: AC
  Administered 2019-03-10: 200 ug via SUBCUTANEOUS
  Filled 2019-03-10: qty 0.4

## 2019-03-16 DIAGNOSIS — I770 Arteriovenous fistula, acquired: Secondary | ICD-10-CM | POA: Diagnosis not present

## 2019-03-16 DIAGNOSIS — I4891 Unspecified atrial fibrillation: Secondary | ICD-10-CM | POA: Diagnosis not present

## 2019-03-16 DIAGNOSIS — Z23 Encounter for immunization: Secondary | ICD-10-CM | POA: Diagnosis not present

## 2019-03-16 DIAGNOSIS — M109 Gout, unspecified: Secondary | ICD-10-CM | POA: Diagnosis not present

## 2019-03-16 DIAGNOSIS — I120 Hypertensive chronic kidney disease with stage 5 chronic kidney disease or end stage renal disease: Secondary | ICD-10-CM | POA: Diagnosis not present

## 2019-03-16 DIAGNOSIS — D571 Sickle-cell disease without crisis: Secondary | ICD-10-CM | POA: Diagnosis not present

## 2019-03-16 DIAGNOSIS — I5020 Unspecified systolic (congestive) heart failure: Secondary | ICD-10-CM | POA: Diagnosis not present

## 2019-03-16 DIAGNOSIS — D631 Anemia in chronic kidney disease: Secondary | ICD-10-CM | POA: Diagnosis not present

## 2019-03-16 DIAGNOSIS — Z720 Tobacco use: Secondary | ICD-10-CM | POA: Diagnosis not present

## 2019-03-16 DIAGNOSIS — N185 Chronic kidney disease, stage 5: Secondary | ICD-10-CM | POA: Diagnosis not present

## 2019-03-16 DIAGNOSIS — N2581 Secondary hyperparathyroidism of renal origin: Secondary | ICD-10-CM | POA: Diagnosis not present

## 2019-03-24 ENCOUNTER — Other Ambulatory Visit: Payer: Self-pay

## 2019-03-24 ENCOUNTER — Ambulatory Visit (HOSPITAL_COMMUNITY)
Admission: RE | Admit: 2019-03-24 | Discharge: 2019-03-24 | Disposition: A | Discharge status: Home / Self Care | Payer: PPO | Source: Ambulatory Visit | Attending: Nephrology | Admitting: Nephrology

## 2019-03-24 ENCOUNTER — Encounter (HOSPITAL_COMMUNITY): Payer: Self-pay | Admitting: Emergency Medicine

## 2019-03-24 ENCOUNTER — Emergency Department (HOSPITAL_COMMUNITY)
Admission: EM | Admit: 2019-03-24 | Discharge: 2019-03-24 | Disposition: A | Discharge status: Home / Self Care | Payer: PPO | Attending: Emergency Medicine | Admitting: Emergency Medicine

## 2019-03-24 ENCOUNTER — Emergency Department (HOSPITAL_COMMUNITY): Payer: PPO

## 2019-03-24 VITALS — BP 130/95 | HR 80 | Temp 97.3°F | Resp 18

## 2019-03-24 DIAGNOSIS — N184 Chronic kidney disease, stage 4 (severe): Secondary | ICD-10-CM | POA: Insufficient documentation

## 2019-03-24 DIAGNOSIS — Z7901 Long term (current) use of anticoagulants: Secondary | ICD-10-CM | POA: Diagnosis not present

## 2019-03-24 DIAGNOSIS — D571 Sickle-cell disease without crisis: Secondary | ICD-10-CM | POA: Diagnosis not present

## 2019-03-24 DIAGNOSIS — Z79899 Other long term (current) drug therapy: Secondary | ICD-10-CM | POA: Diagnosis not present

## 2019-03-24 DIAGNOSIS — I130 Hypertensive heart and chronic kidney disease with heart failure and stage 1 through stage 4 chronic kidney disease, or unspecified chronic kidney disease: Secondary | ICD-10-CM | POA: Insufficient documentation

## 2019-03-24 DIAGNOSIS — I5023 Acute on chronic systolic (congestive) heart failure: Secondary | ICD-10-CM | POA: Diagnosis not present

## 2019-03-24 DIAGNOSIS — R0602 Shortness of breath: Secondary | ICD-10-CM | POA: Diagnosis not present

## 2019-03-24 DIAGNOSIS — Z87891 Personal history of nicotine dependence: Secondary | ICD-10-CM | POA: Insufficient documentation

## 2019-03-24 DIAGNOSIS — I110 Hypertensive heart disease with heart failure: Secondary | ICD-10-CM | POA: Diagnosis not present

## 2019-03-24 LAB — CBC
HCT: 21.5 % — ABNORMAL LOW (ref 39.0–52.0)
Hemoglobin: 7.7 g/dL — ABNORMAL LOW (ref 13.0–17.0)
MCH: 39.1 pg — ABNORMAL HIGH (ref 26.0–34.0)
MCHC: 35.8 g/dL (ref 30.0–36.0)
MCV: 109.1 fL — ABNORMAL HIGH (ref 80.0–100.0)
Platelets: 170 10*3/uL (ref 150–400)
RBC: 1.97 MIL/uL — ABNORMAL LOW (ref 4.22–5.81)
RDW: 18 % — ABNORMAL HIGH (ref 11.5–15.5)
WBC: 5.6 10*3/uL (ref 4.0–10.5)
nRBC: 6.9 % — ABNORMAL HIGH (ref 0.0–0.2)

## 2019-03-24 LAB — FERRITIN: Ferritin: 22 ng/mL — ABNORMAL LOW (ref 24–336)

## 2019-03-24 LAB — BASIC METABOLIC PANEL
Anion gap: 12 (ref 5–15)
BUN: 73 mg/dL — ABNORMAL HIGH (ref 8–23)
CO2: 23 mmol/L (ref 22–32)
Calcium: 8.9 mg/dL (ref 8.9–10.3)
Chloride: 99 mmol/L (ref 98–111)
Creatinine, Ser: 5.35 mg/dL — ABNORMAL HIGH (ref 0.61–1.24)
GFR calc Af Amer: 12 mL/min — ABNORMAL LOW (ref >60)
GFR calc non Af Amer: 11 mL/min — ABNORMAL LOW (ref >60)
Glucose, Bld: 93 mg/dL (ref 70–99)
Potassium: 4.4 mmol/L (ref 3.5–5.1)
Sodium: 134 mmol/L — ABNORMAL LOW (ref 135–145)

## 2019-03-24 LAB — BRAIN NATRIURETIC PEPTIDE: B Natriuretic Peptide: 1953.6 pg/mL — ABNORMAL HIGH (ref 0.0–100.0)

## 2019-03-24 LAB — IRON AND TIBC
Iron: 51 ug/dL (ref 45–182)
Saturation Ratios: 15 % — ABNORMAL LOW (ref 17.9–39.5)
TIBC: 349 ug/dL (ref 250–450)
UIBC: 298 ug/dL

## 2019-03-24 LAB — TROPONIN I (HIGH SENSITIVITY)
Troponin I (High Sensitivity): 15 ng/L (ref <18)
Troponin I (High Sensitivity): 15 ng/L (ref <18)

## 2019-03-24 LAB — POCT HEMOGLOBIN-HEMACUE: Hemoglobin: 9.2 g/dL — ABNORMAL LOW (ref 13.0–17.0)

## 2019-03-24 MED ORDER — POTASSIUM CHLORIDE CRYS ER 20 MEQ PO TBCR: 20.0000 meq | EXTENDED_RELEASE_TABLET | ORAL | 0 refills | Status: DC

## 2019-03-24 MED ORDER — FUROSEMIDE 10 MG/ML IJ SOLN
160.0000 mg | Freq: Once | INTRAVENOUS | Status: AC
  Administered 2019-03-24: 160 mg via INTRAVENOUS
  Filled 2019-03-24: qty 16

## 2019-03-24 MED ORDER — SODIUM CHLORIDE 0.9% FLUSH: 3.0000 mL | Freq: Once | INTRAVENOUS | Status: DC

## 2019-03-24 MED ORDER — DARBEPOETIN ALFA 200 MCG/0.4ML IJ SOSY
PREFILLED_SYRINGE | INTRAMUSCULAR | Status: AC
  Administered 2019-03-24: 200 ug
  Filled 2019-03-24: qty 0.4

## 2019-03-24 MED ORDER — DARBEPOETIN ALFA 300 MCG/0.6ML IJ SOSY: 225.0000 ug | PREFILLED_SYRINGE | INTRAMUSCULAR | Status: DC

## 2019-03-24 MED ORDER — NITROGLYCERIN 0.4 MG SL SUBL
0.4000 mg | SUBLINGUAL_TABLET | SUBLINGUAL | Status: DC | PRN
  Administered 2019-03-24: 0.4 mg via SUBLINGUAL
  Filled 2019-03-24: qty 1

## 2019-03-24 MED ORDER — DARBEPOETIN ALFA 25 MCG/0.42ML IJ SOSY
PREFILLED_SYRINGE | INTRAMUSCULAR | Status: AC
  Administered 2019-03-24: 25 ug
  Filled 2019-03-24: qty 0.42

## 2019-03-24 MED ORDER — ASPIRIN 81 MG PO CHEW
324.0000 mg | CHEWABLE_TABLET | Freq: Once | ORAL | Status: AC
  Administered 2019-03-24: 324 mg via ORAL
  Filled 2019-03-24: qty 4

## 2019-03-24 MED ORDER — METOLAZONE 5 MG PO TABS: 5.0000 mg | ORAL_TABLET | ORAL | 0 refills | Status: DC

## 2019-03-24 NOTE — ED Triage Notes (Signed)
Pt reports having chf with worsening in his sob and swelling in lower legs and abd for 1 week. Pt states yesterday he also had a tightness in his chest. Pt states it is more difficult to lay flat and is currently sleeping on 4-5 pillows and one under abd.

## 2019-03-24 NOTE — Discharge Instructions (Signed)
Take the metolazone and potassium together once per week, starting tomorrow 10/7.  Otherwise follow-up closely with your nephrologist for the increased swelling and fluid.  If you develop worsening shortness of breath, chest pain, or any other new/concerning symptoms then return to the ER for evaluation.  If you develop recurrent, continued, or worsening chest pain, shortness of breath, fever, vomiting, abdominal or back pain, or any other new/concerning symptoms then return to the ER for evaluation.

## 2019-03-24 NOTE — ED Provider Notes (Signed)
[facility] EMERGENCY DEPARTMENT Provider Note   CSN: [id number] Arrival date & time: 03/24/19  0813     History   Chief Complaint Chief Complaint  Patient presents with  . Congestive Heart Failure  . Shortness of Breath    HPI Patrick [name] is a 63 y.o. male.     HPI  63 year old male presents with chest pain and shortness of breath.  Shortness of breath has been for about a week and slowly worsening.  He feels like he has had increased swelling in his abdomen and ankles.  Feels like prior CHF.  About a week ago his nephrologist increased his torsemide from 100 mg up to 100 mg twice daily.  He just started the twice daily dosing 3 or 4 days ago.  Does not feel much better.  Chest pain has been coming and going since yesterday at work.  He has a hard time describing it.  Does not radiate.  No new back pain or abdominal pain.  Chest pain typically does not come with the CHF.  It is mild at this time.  Past Medical History:  Diagnosis Date  . Blood dyscrasia   . Blood transfusion    "I've had a bunch of them"  . CHF (congestive heart failure) ([place])    followed by hf clinic  . Chronic kidney disease (CKD), stage III (moderate)   . Gout   . [id number])    "probably weekly" (01/13/2014)  . Legally blind   . Shortness of breath dyspnea   . Sickle cell disease, type SS ([place])   . Swelling abdomen   . Swelling of extremity, left   . Swelling of extremity, right     Patient Active Problem List   Diagnosis Date Noted  . Hypoxia 06/02/2018  . Acute GI bleeding 09/19/2017  . Symptomatic anemia   . Chronic heart failure with preserved ejection fraction ([place])   . Colon cancer screening 07/16/2017  . Encounter for therapeutic drug monitoring 12/29/2015  . 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  . Atrial fibrillation ([place]) 12/24/2015  . Cough with sputum 10/25/2015  . Muscle tightness-Right arm  05/27/2014  . Arm pain, right 05/06/2014  . End stage renal  disease ([place]) 05/06/2014  . Chest pain 01/13/2014  . Vitamin D deficiency 09/25/2013  . CKD (chronic kidney disease), stage IV ([place]) 09/25/2013  . Elevated uric acid in blood 09/25/2013  . Hb-SS disease without crisis ([place]) 09/25/2013  . Anemia 09/25/2013  . Onychomycosis of toenail 08/27/2013  . Cellulitis 08/27/2013  . Hyperglycemia 08/27/2013  . CHF (congestive heart failure) ([place]) 08/19/2013  . Sickle cell anemia ([place]) 11/21/2012  . Abdominal pain 01/17/2012  . Tobacco abuse 10/29/2011  . NSVT (nonsustained ventricular tachycardia) ([place]) 10/21/2011  . Gout 10/19/2011  . Sickle cell crisis ([place]) 10/17/2011  . Acute on chronic systolic and diastolic heart failure, NYHA class 4 ([place]) 10/17/2011  . HTN (hypertension) 06/07/2011    Past Surgical History:  Procedure Laterality Date  . BASCILIC VEIN TRANSPOSITION Right 04/12/2014   Procedure: BASILIC VEIN TRANSPOSITION;  Surgeon: Elam Dutch, MD;  Location: [facility];  Service: Vascular;  Laterality: Right;  . CARDIAC CATHETERIZATION  05/2011  . CARDIOVERSION N/A 06/05/2018   Procedure: CARDIOVERSION;  Surgeon: Jolaine Artist, MD;  Location: [facility];  Service: Cardiovascular;  Laterality: N/A;  . COLONOSCOPY WITH PROPOFOL N/A 09/24/2017   Procedure: COLONOSCOPY WITH PROPOFOL;  Surgeon: Jackquline Denmark, MD;  Location: [facility] ENDOSCOPY;  Service: Endoscopy;  Laterality: N/A;  . ESOPHAGOGASTRODUODENOSCOPY N/A 09/19/2017   Procedure: ESOPHAGOGASTRODUODENOSCOPY (EGD);  Surgeon: Jackquline Denmark, MD;  Location: [facility] ENDOSCOPY;  Service: Endoscopy;  Laterality: N/A;  . LEFT AND RIGHT HEART CATHETERIZATION WITH CORONARY ANGIOGRAM N/A 06/11/2011   Procedure: LEFT AND RIGHT HEART CATHETERIZATION WITH CORONARY ANGIOGRAM;  Surgeon: Larey Dresser, MD;  Location: [facility] CATH LAB;  Service: Cardiovascular;  Laterality: N/A;  . TEE WITHOUT CARDIOVERSION N/A 12/27/2015   Procedure: TRANSESOPHAGEAL ECHOCARDIOGRAM (TEE);  Surgeon: Sueanne Margarita, MD;   Location: [facility] ENDOSCOPY;  Service: Cardiovascular;  Laterality: N/A;        Home Medications    Prior to Admission medications   Medication Sig Start Date End Date Taking? Authorizing Provider  allopurinol (ZYLOPRIM) 100 MG tablet Take 100 mg by mouth 2 (two) times daily.   Yes [provider]  calcitRIOL (ROCALTROL) 0.5 MCG capsule Take 0.5 mcg by mouth daily. 04/15/18  Yes [provider]  carvedilol (COREG) 12.5 MG tablet TAKE 1 TABLET BY MOUTH 2 (TWO) TIMES DAILY WITH A MEAL. Patient taking differently: Take 12.5 mg by mouth 2 (two) times daily with a meal.  07/14/18  Yes Bensimhon, Shaune Pascal, MD  ELIQUIS 5 MG TABS tablet TAKE 1 TABLET BY MOUTH TWICE A DAY Patient taking differently: Take 5 mg by mouth 2 (two) times daily.  11/17/18  Yes Bensimhon, Shaune Pascal, MD  folic acid (FOLVITE) 1 MG tablet TAKE 1 TABLET (1 MG TOTAL) BY MOUTH DAILY. Patient taking differently: Take 1 mg by mouth daily.  10/10/17  Yes Dorena Dew, FNP  hydrALAZINE (APRESOLINE) 25 MG tablet Take 1 tablet (25 mg total) by mouth 2 (two) times daily. 10/10/17 03/24/19 Yes Bensimhon, Shaune Pascal, MD  hydroxyurea (HYDREA) 500 MG capsule TAKE ONE CAPSULE BY MOUTH EVERY DAY WITH FOOD TO MINIMIZE GI SIDE EFFECTS 02/12/19  Yes Jegede, Olugbemiga E, MD  isosorbide mononitrate (IMDUR) 30 MG 24 hr tablet Take 1 tablet (30 mg total) by mouth daily. 03/10/19  Yes Bensimhon, Shaune Pascal, MD  torsemide (DEMADEX) 100 MG tablet TAKE 1 TABLET BY MOUTH EVERY DAY Patient taking differently: Take 100 mg by mouth 2 (two) times daily.  12/02/18  Yes Bensimhon, Shaune Pascal, MD  metolazone (ZAROXOLYN) 5 MG tablet Take 1 tablet (5 mg total) by mouth once a week for 5 doses. 03/25/19 04/23/19  Sherwood Gambler, MD  pantoprazole (PROTONIX) 40 MG tablet Take 1 tablet (40 mg total) by mouth daily at 6 (six) AM. 03/07/18 09/08/18  Lanae Boast, FNP  potassium chloride SA (KLOR-CON) 20 MEQ tablet Take 1 tablet (20 mEq total) by mouth once a week for  5 doses. Take with the metolazone 03/25/19 04/23/19  Sherwood Gambler, MD  sevelamer carbonate (RENVELA) 800 MG tablet  04/28/18   [provider]  sildenafil (VIAGRA) 50 MG tablet Take 1 tablet (50 mg total) by mouth daily as needed for erectile dysfunction. Do not take same day as Isosorbide 11/28/18   Lanae Boast, FNP    Family History Family History  Problem Relation Age of Onset  . Alcohol abuse Mother   . Liver disease Mother   . Cirrhosis Mother   . Heart attack Mother     Social History Social History   Tobacco Use  . Smoking status: Former Smoker    Packs/day: 0.25    Years: 41.00    Pack years: 10.25    Types: Cigarettes    Quit date: 12/08/2015    Years since quitting: 3.2  .  Smokeless tobacco: Never Used  Substance Use Topics  . Alcohol use: Yes    Alcohol/week: 3.0 standard drinks    Types: 1 Glasses of wine, 2 Cans of beer per week    Comment: occasional  . Drug use: No     Allergies   Patient has no known allergies.   Review of Systems Review of Systems  Respiratory: Positive for shortness of breath.   Cardiovascular: Positive for chest pain and leg swelling.  Gastrointestinal: Positive for abdominal distention. Negative for abdominal pain.  All other systems reviewed and are negative.    Physical Exam Updated Vital Signs BP (!) 140/94   Pulse 85   Temp 97.7 F (36.5 C) (Oral)   Resp 19   SpO2 98%   Physical Exam Vitals signs and nursing note reviewed.  Constitutional:      General: He is not in acute distress.    Appearance: He is well-developed. He is not ill-appearing or diaphoretic.  HENT:     Head: Normocephalic and atraumatic.     Right Ear: External ear normal.     Left Ear: External ear normal.     Nose: Nose normal.  Eyes:     General:        Right eye: No discharge.        Left eye: No discharge.  Neck:     Musculoskeletal: Neck supple.  Cardiovascular:     Rate and Rhythm: Normal rate and regular rhythm.      Heart sounds: Normal heart sounds.  Pulmonary:     Effort: Pulmonary effort is normal.     Breath sounds: Decreased breath sounds (mild, at the bases) present.  Abdominal:     Palpations: Abdomen is soft.     Tenderness: There is no abdominal tenderness.  Musculoskeletal:     Right lower leg: No edema.     Left lower leg: No edema.  Skin:    General: Skin is warm and dry.  Neurological:     Mental Status: He is alert.  Psychiatric:        Mood and Affect: Mood is not anxious.      ED Treatments / Results  Labs (all labs ordered are listed, but only abnormal results are displayed) Labs Reviewed  BASIC METABOLIC PANEL - Abnormal; Notable for the following components:      Result Value   Sodium 134 (*)    BUN 73 (*)    Creatinine, Ser 5.35 (*)    GFR calc non Af Amer 11 (*)    GFR calc Af Amer 12 (*)    All other components within normal limits  CBC - Abnormal; Notable for the following components:   RBC 1.97 (*)    Hemoglobin 7.7 (*)    HCT 21.5 (*)    MCV 109.1 (*)    MCH 39.1 (*)    RDW 18.0 (*)    nRBC 6.9 (*)    All other components within normal limits  BRAIN NATRIURETIC PEPTIDE - Abnormal; Notable for the following components:   B Natriuretic Peptide 1,953.6 (*)    All other components within normal limits  TROPONIN I (HIGH SENSITIVITY)  TROPONIN I (HIGH SENSITIVITY)    EKG EKG Interpretation  Date/Time:  Tuesday March 24 2019 09:41:28 EDT Ventricular Rate:  91 PR Interval:    QRS Duration: 97 QT Interval:  399 QTC Calculation: 491 R Axis:   77 Text Interpretation:  Atrial fibrillation Borderline prolonged QT interval Afib  is present, otherwise no acute ST/T changes compared to Dec 2019 Confirmed by Sherwood Gambler [phone]) on 03/24/2019 10:06:22 AM   Radiology Dg Chest 2 View  Result Date: 03/24/2019 CLINICAL DATA:  Worsening CHF which shortness-of-breath and lower extremity swelling 1 week. EXAM: CHEST - 2 VIEW COMPARISON:  06/02/2018 FINDINGS:  Lungs are adequately inflated demonstrate mild stable prominence of the central bronchovascular markings. No lobar consolidation or effusion. Mild stable cardiomegaly. Remainder of the exam is unchanged. IMPRESSION: No acute cardiopulmonary disease. Chronic stable prominence of the central bronchovascular markings Mild stable cardiomegaly. Electronically Signed   By: Marin Olp M.D.   On: 03/24/2019 09:09    Procedures Procedures (including critical care time)  Medications Ordered in ED Medications  sodium chloride flush (NS) 0.9 % injection 3 mL (3 mLs Intravenous Not Given 03/24/19 0913)  nitroGLYCERIN (NITROSTAT) SL tablet 0.4 mg (0.4 mg Sublingual Given 03/24/19 1026)  aspirin chewable tablet 324 mg (324 mg Oral Given 03/24/19 1010)  furosemide (LASIX) 160 mg in dextrose 5 % 50 mL IVPB (0 mg Intravenous Stopped 03/24/19 1417)     Initial Impression / Assessment and Plan / ED Course  I have reviewed the triage vital signs and the nursing notes.  Pertinent labs & imaging results that were available during my care of the patient were reviewed by me and considered in my medical decision making (see chart for details).        Patient states he is feeling a little bit better.  His chest pain is pretty atypical.  With troponins negative x2 and unchanged ECG from baseline I think ACS is pretty unlikely.  Doubt dissection or PE.  I discussed with his cardiologist, Dr. Haroldine Laws, who recommends 160 mg IV Lasix.  If doing okay and urinating, discharge home with 5 mg metolazone with 20 mEq potassium once per week.  Follow-up with his nephrologist.  Patient wants to go home and was prescribed these meds and instructed on return precautions.  Final Clinical Impressions(s) / ED Diagnoses   Final diagnoses:  Acute on chronic systolic congestive heart failure [facility])    ED Discharge Orders         Ordered    metolazone (ZAROXOLYN) 5 MG tablet  Weekly     03/24/19 1345    potassium chloride SA  (KLOR-CON) 20 MEQ tablet  Weekly     03/24/19 1345           Sherwood Gambler, MD 03/24/19 1516

## 2019-03-24 NOTE — ED Notes (Signed)
Pt A&Ox4, ambulatory with independent steady gait, NAD. Verbalized understanding of discharge instructions and follow up care.

## 2019-03-30 ENCOUNTER — Telehealth: Payer: Self-pay | Admitting: Internal Medicine

## 2019-03-30 ENCOUNTER — Other Ambulatory Visit: Payer: Self-pay | Admitting: Internal Medicine

## 2019-03-30 DIAGNOSIS — D571 Sickle-cell disease without crisis: Secondary | ICD-10-CM

## 2019-03-30 DIAGNOSIS — N185 Chronic kidney disease, stage 5: Secondary | ICD-10-CM | POA: Diagnosis not present

## 2019-03-30 MED ORDER — OXYCODONE HCL 5 MG PO TABS: 5.0000 mg | ORAL_TABLET | Freq: Four times a day (QID) | ORAL | 0 refills | Status: AC | PRN

## 2019-03-30 NOTE — Telephone Encounter (Signed)
Refilled

## 2019-04-07 ENCOUNTER — Other Ambulatory Visit: Payer: Self-pay

## 2019-04-07 ENCOUNTER — Ambulatory Visit (HOSPITAL_COMMUNITY)
Admission: RE | Admit: 2019-04-07 | Discharge: 2019-04-07 | Disposition: A | Discharge status: Home / Self Care | Payer: PPO | Source: Ambulatory Visit | Attending: Nephrology | Admitting: Nephrology

## 2019-04-07 VITALS — BP 118/74 | HR 73 | Temp 96.4°F | Resp 18

## 2019-04-07 DIAGNOSIS — N184 Chronic kidney disease, stage 4 (severe): Secondary | ICD-10-CM | POA: Diagnosis not present

## 2019-04-07 MED ORDER — DARBEPOETIN ALFA 300 MCG/0.6ML IJ SOSY: 225.0000 ug | PREFILLED_SYRINGE | INTRAMUSCULAR | Status: DC

## 2019-04-07 MED ORDER — DARBEPOETIN ALFA 200 MCG/0.4ML IJ SOSY
PREFILLED_SYRINGE | INTRAMUSCULAR | Status: AC
  Administered 2019-04-07: 200 ug via SUBCUTANEOUS
  Filled 2019-04-07: qty 0.4

## 2019-04-07 MED ORDER — DARBEPOETIN ALFA 25 MCG/0.42ML IJ SOSY
PREFILLED_SYRINGE | INTRAMUSCULAR | Status: AC
  Administered 2019-04-07: 25 ug via SUBCUTANEOUS
  Filled 2019-04-07: qty 0.42

## 2019-04-07 NOTE — Progress Notes (Signed)
Hemocue resulted today 7.9.  Called result to [place] at [facility].  No new orders received.

## 2019-04-08 LAB — POCT HEMOGLOBIN-HEMACUE: Hemoglobin: 7.9 g/dL — ABNORMAL LOW (ref 13.0–17.0)

## 2019-04-13 DIAGNOSIS — N185 Chronic kidney disease, stage 5: Secondary | ICD-10-CM | POA: Diagnosis not present

## 2019-04-13 DIAGNOSIS — D571 Sickle-cell disease without crisis: Secondary | ICD-10-CM | POA: Diagnosis not present

## 2019-04-13 DIAGNOSIS — M109 Gout, unspecified: Secondary | ICD-10-CM | POA: Diagnosis not present

## 2019-04-13 DIAGNOSIS — I770 Arteriovenous fistula, acquired: Secondary | ICD-10-CM | POA: Diagnosis not present

## 2019-04-13 DIAGNOSIS — D631 Anemia in chronic kidney disease: Secondary | ICD-10-CM | POA: Diagnosis not present

## 2019-04-13 DIAGNOSIS — I4891 Unspecified atrial fibrillation: Secondary | ICD-10-CM | POA: Diagnosis not present

## 2019-04-13 DIAGNOSIS — Z720 Tobacco use: Secondary | ICD-10-CM | POA: Diagnosis not present

## 2019-04-13 DIAGNOSIS — I5020 Unspecified systolic (congestive) heart failure: Secondary | ICD-10-CM | POA: Diagnosis not present

## 2019-04-13 DIAGNOSIS — I120 Hypertensive chronic kidney disease with stage 5 chronic kidney disease or end stage renal disease: Secondary | ICD-10-CM | POA: Diagnosis not present

## 2019-04-13 DIAGNOSIS — N2581 Secondary hyperparathyroidism of renal origin: Secondary | ICD-10-CM | POA: Diagnosis not present

## 2019-04-15 ENCOUNTER — Other Ambulatory Visit (HOSPITAL_COMMUNITY): Payer: Self-pay | Admitting: Internal Medicine

## 2019-04-19 DIAGNOSIS — Z992 Dependence on renal dialysis: Secondary | ICD-10-CM

## 2019-04-19 HISTORY — DX: Dependence on renal dialysis: Z99.2

## 2019-04-20 DIAGNOSIS — Z111 Encounter for screening for respiratory tuberculosis: Secondary | ICD-10-CM | POA: Diagnosis not present

## 2019-04-20 DIAGNOSIS — Z992 Dependence on renal dialysis: Secondary | ICD-10-CM | POA: Diagnosis not present

## 2019-04-20 DIAGNOSIS — D689 Coagulation defect, unspecified: Secondary | ICD-10-CM | POA: Diagnosis not present

## 2019-04-20 DIAGNOSIS — D509 Iron deficiency anemia, unspecified: Secondary | ICD-10-CM | POA: Diagnosis not present

## 2019-04-20 DIAGNOSIS — N186 End stage renal disease: Secondary | ICD-10-CM | POA: Diagnosis not present

## 2019-04-20 DIAGNOSIS — Z23 Encounter for immunization: Secondary | ICD-10-CM | POA: Diagnosis not present

## 2019-04-20 DIAGNOSIS — D631 Anemia in chronic kidney disease: Secondary | ICD-10-CM | POA: Diagnosis not present

## 2019-04-20 DIAGNOSIS — N2581 Secondary hyperparathyroidism of renal origin: Secondary | ICD-10-CM | POA: Diagnosis not present

## 2019-04-21 ENCOUNTER — Encounter (HOSPITAL_COMMUNITY): Payer: PPO

## 2019-04-28 ENCOUNTER — Encounter: Payer: Self-pay | Admitting: Family Medicine

## 2019-04-28 ENCOUNTER — Other Ambulatory Visit: Payer: Self-pay

## 2019-04-28 ENCOUNTER — Ambulatory Visit (INDEPENDENT_AMBULATORY_CARE_PROVIDER_SITE_OTHER): Payer: PPO | Admitting: Family Medicine

## 2019-04-28 VITALS — BP 124/70 | HR 87 | Temp 98.0°F | Resp 16 | Ht 71.0 in | Wt 146.0 lb

## 2019-04-28 DIAGNOSIS — N186 End stage renal disease: Secondary | ICD-10-CM

## 2019-04-28 DIAGNOSIS — M79601 Pain in right arm: Secondary | ICD-10-CM | POA: Diagnosis not present

## 2019-04-28 MED ORDER — OXYCODONE HCL 10 MG PO TABS: 10.0000 mg | ORAL_TABLET | ORAL | 0 refills | Status: DC | PRN

## 2019-04-28 MED ORDER — GABAPENTIN 100 MG PO CAPS: 100.0000 mg | ORAL_CAPSULE | Freq: Every day | ORAL | 3 refills | Status: DC

## 2019-05-01 DIAGNOSIS — T82898A Other specified complication of vascular prosthetic devices, implants and grafts, initial encounter: Secondary | ICD-10-CM | POA: Diagnosis not present

## 2019-05-01 DIAGNOSIS — N189 Chronic kidney disease, unspecified: Secondary | ICD-10-CM | POA: Diagnosis not present

## 2019-05-01 DIAGNOSIS — N185 Chronic kidney disease, stage 5: Secondary | ICD-10-CM | POA: Diagnosis not present

## 2019-05-01 DIAGNOSIS — Z992 Dependence on renal dialysis: Secondary | ICD-10-CM | POA: Diagnosis not present

## 2019-05-01 DIAGNOSIS — N186 End stage renal disease: Secondary | ICD-10-CM | POA: Diagnosis not present

## 2019-05-04 NOTE — Progress Notes (Signed)
Established Patient Office Visit  Subjective:  Patient ID: Patrick [name], male    DOB: 28-Jun-1955  Age: 63 y.o. MRN: [id number]  CC:  Chief Complaint  Patient presents with  . Hypertension  . Sickle Cell Anemia  . Arm Pain    right arm pain     HPI Patrick [name] is a very pleasant 63 year old male with a medical history of sickle cell disease, hypertension, chronic heart failure with preserved ejection fraction, atrial fibrillation, end-stage renal disease, anemia of chronic disease, vitamin D deficiency, and tobacco dependence presents complaining of right upper arm pain.  Patient has a history of end-stage renal disease and is scheduled to receive dialysis on Monday, Wednesday, and Fridays.  During previous session of dialysis, patient experienced intense pain to right upper extremity after process was initiated.  Dialysis was discontinued immediately and patient was evaluated and sent home.  He states that he canceled the most recent dialysis session due to increased upper arm pain.  Current pain intensity is 9/10 characterized as burning, shooting, throbbing, and constant.  He states that he has been unable to get any sleep due to increased pain.  He also says that it is difficult to raise arm above head, he was unable to put on shirt, or brush hair without assistance.  Patient is right arm dominant.  He currently denies fever, chills, headache, shortness of breath, nausea, vomiting, or diarrhea.  Past Medical History:  Diagnosis Date  . Blood dyscrasia   . Blood transfusion    "I've had a bunch of them"  . CHF (congestive heart failure) ([place])    followed by hf clinic  . Chronic kidney disease (CKD), stage III (moderate)   . Gout   . [id number])    "probably weekly" (01/13/2014)  . Legally blind   . Shortness of breath dyspnea   . Sickle cell disease, type SS ([place])   . Swelling abdomen   . Swelling of extremity, left   . Swelling of extremity, right     Past  Surgical History:  Procedure Laterality Date  . BASCILIC VEIN TRANSPOSITION Right 04/12/2014   Procedure: BASILIC VEIN TRANSPOSITION;  Surgeon: Elam Dutch, MD;  Location: [facility];  Service: Vascular;  Laterality: Right;  . CARDIAC CATHETERIZATION  05/2011  . CARDIOVERSION N/A 06/05/2018   Procedure: CARDIOVERSION;  Surgeon: Jolaine Artist, MD;  Location: [facility];  Service: Cardiovascular;  Laterality: N/A;  . COLONOSCOPY WITH PROPOFOL N/A 09/24/2017   Procedure: COLONOSCOPY WITH PROPOFOL;  Surgeon: Jackquline Denmark, MD;  Location: [facility] ENDOSCOPY;  Service: Endoscopy;  Laterality: N/A;  . ESOPHAGOGASTRODUODENOSCOPY N/A 09/19/2017   Procedure: ESOPHAGOGASTRODUODENOSCOPY (EGD);  Surgeon: Jackquline Denmark, MD;  Location: [facility] ENDOSCOPY;  Service: Endoscopy;  Laterality: N/A;  . LEFT AND RIGHT HEART CATHETERIZATION WITH CORONARY ANGIOGRAM N/A 06/11/2011   Procedure: LEFT AND RIGHT HEART CATHETERIZATION WITH CORONARY ANGIOGRAM;  Surgeon: Larey Dresser, MD;  Location: [facility] CATH LAB;  Service: Cardiovascular;  Laterality: N/A;  . TEE WITHOUT CARDIOVERSION N/A 12/27/2015   Procedure: TRANSESOPHAGEAL ECHOCARDIOGRAM (TEE);  Surgeon: Sueanne Margarita, MD;  Location: [facility] ENDOSCOPY;  Service: Cardiovascular;  Laterality: N/A;    Family History  Problem Relation Age of Onset  . Alcohol abuse Mother   . Liver disease Mother   . Cirrhosis Mother   . Heart attack Mother     Social History   Socioeconomic History  . Marital status: Divorced    Spouse name: Not on file  . Number of  children: Not on file  . Years of education: Not on file  . Highest education level: Not on file  Occupational History  . Not on file  Social Needs  . Financial resource strain: Not on file  . Food insecurity    Worry: Not on file    Inability: Not on file  . Transportation needs    Medical: Not on file    Non-medical: Not on file  Tobacco Use  . Smoking status: Former Smoker    Packs/day: 0.25    Years: 41.00     Pack years: 10.25    Types: Cigarettes    Quit date: 12/08/2015    Years since quitting: 3.4  . Smokeless tobacco: Never Used  Substance and Sexual Activity  . Alcohol use: Yes    Alcohol/week: 3.0 standard drinks    Types: 1 Glasses of wine, 2 Cans of beer per week    Comment: occasional  . Drug use: No  . Sexual activity: Not Currently  Lifestyle  . Physical activity    Days per week: Not on file    Minutes per session: Not on file  . Stress: Not on file  Relationships  . Social Herbalist on phone: Not on file    Gets together: Not on file    Attends religious service: Not on file    Active member of club or organization: Not on file    Attends meetings of clubs or organizations: Not on file    Relationship status: Not on file  . Intimate partner violence    Fear of current or ex partner: Not on file    Emotionally abused: Not on file    Physically abused: Not on file    Forced sexual activity: Not on file  Other Topics Concern  . Not on file  Social History Narrative  . Not on file    Outpatient Medications Prior to Visit  Medication Sig Dispense Refill  . allopurinol (ZYLOPRIM) 100 MG tablet Take 100 mg by mouth 2 (two) times daily.    . calcitRIOL (ROCALTROL) 0.5 MCG capsule Take 0.5 mcg by mouth daily.  3  . carvedilol (COREG) 12.5 MG tablet TAKE 1 TABLET BY MOUTH 2 (TWO) TIMES DAILY WITH A MEAL. 180 tablet 2  . ELIQUIS 5 MG TABS tablet TAKE 1 TABLET BY MOUTH TWICE A DAY (Patient taking differently: Take 5 mg by mouth 2 (two) times daily. ) 60 tablet 5  . folic acid (FOLVITE) 1 MG tablet TAKE 1 TABLET (1 MG TOTAL) BY MOUTH DAILY. (Patient taking differently: Take 1 mg by mouth daily. ) 30 tablet 11  . hydrALAZINE (APRESOLINE) 25 MG tablet Take 1 tablet (25 mg total) by mouth 2 (two) times daily. 60 tablet 6  . hydroxyurea (HYDREA) 500 MG capsule TAKE ONE CAPSULE BY MOUTH EVERY DAY WITH FOOD TO MINIMIZE GI SIDE EFFECTS 90 capsule 3  . isosorbide mononitrate  (IMDUR) 30 MG 24 hr tablet Take 1 tablet (30 mg total) by mouth daily. 90 tablet 2  . sildenafil (VIAGRA) 50 MG tablet Take 1 tablet (50 mg total) by mouth daily as needed for erectile dysfunction. Do not take same day as Isosorbide 4 tablet 0  . torsemide (DEMADEX) 100 MG tablet TAKE 1 TABLET BY MOUTH EVERY DAY (Patient taking differently: Take 100 mg by mouth 2 (two) times daily. ) 90 tablet 1  . sevelamer carbonate (RENVELA) 800 MG tablet     . metolazone (ZAROXOLYN)  5 MG tablet Take 1 tablet (5 mg total) by mouth once a week for 5 doses. 5 tablet 0  . pantoprazole (PROTONIX) 40 MG tablet Take 1 tablet (40 mg total) by mouth daily at 6 (six) AM. 30 tablet 0  . potassium chloride SA (KLOR-CON) 20 MEQ tablet Take 1 tablet (20 mEq total) by mouth once a week for 5 doses. Take with the metolazone 5 tablet 0   No facility-administered medications prior to visit.     No Known Allergies  ROS Review of Systems  Constitutional: Negative.   HENT: Negative.   Eyes: Negative.   Respiratory: Negative.   Cardiovascular: Negative.   Gastrointestinal: Negative.   Endocrine: Negative.   Musculoskeletal: Negative for gait problem.       Right upper arm pain  Neurological: Negative for dizziness, tremors and light-headedness.       Legally blind  Hematological: Negative.   Psychiatric/Behavioral: Negative.       Objective:    Physical Exam  Constitutional: He appears well-developed and well-nourished.  HENT:  Head: Normocephalic.  Cardiovascular: Normal rate and regular rhythm.  Murmur heard. Pulmonary/Chest: Effort normal and breath sounds normal.  Abdominal: Soft. Bowel sounds are normal.  Musculoskeletal:     Right shoulder: He exhibits decreased range of motion, tenderness, swelling, pain and decreased strength.     Comments: Pronounced fistula, tender to touch.  Decreased range of motion.    BP 124/70 (BP Location: Left Arm, Patient Position: Sitting, Cuff Size: Normal)   Pulse 87    Temp 98 F (36.7 C) (Oral)   Resp 16   Ht 5\' 11"  (1.803 m)   Wt 146 lb (66.2 kg)   SpO2 100%   BMI 20.36 kg/m  Wt Readings from Last 3 Encounters:  04/28/19 146 lb (66.2 kg)  09/08/18 148 lb 9.6 oz (67.4 kg)  07/28/18 150 lb (68 kg)      Lab Results  Component Value Date   TSH 2.301 02/17/2015   Lab Results  Component Value Date   WBC 5.6 03/24/2019   HGB 7.9 (L) 04/07/2019   HCT 21.5 (L) 03/24/2019   MCV 109.1 (H) 03/24/2019   PLT 170 03/24/2019   Lab Results  Component Value Date   NA 134 (L) 03/24/2019   K 4.4 03/24/2019   CO2 23 03/24/2019   GLUCOSE 93 03/24/2019   BUN 73 (H) 03/24/2019   CREATININE 5.35 (H) 03/24/2019   BILITOT 1.7 (H) 06/03/2018   ALKPHOS 56 06/03/2018   AST 33 06/03/2018   ALT 30 06/03/2018   PROT 6.2 (L) 06/03/2018   ALBUMIN 3.0 (L) 06/06/2018   CALCIUM 8.9 03/24/2019   ANIONGAP 12 03/24/2019   GFR 46.63 (L) 01/08/2012   Lab Results  Component Value Date   CHOL 114 (L) 02/17/2015   Lab Results  Component Value Date   HDL 31 (L) 02/17/2015   Lab Results  Component Value Date   LDLCALC 68 02/17/2015   Lab Results  Component Value Date   TRIG 76 02/17/2015   Lab Results  Component Value Date   CHOLHDL 3.7 02/17/2015   Lab Results  Component Value Date   HGBA1C <4.0 06/07/2011      Assessment & Plan:   Problem List Items Addressed This Visit      Cardiovascular and Mediastinum   HTN (hypertension) - Primary (Chronic)     Other   Sickle cell crisis (HCC) (Chronic)    Other Visit Diagnoses    Arm  pain, diffuse, right       Relevant Medications   Oxycodone HCl 10 MG TABS   gabapentin (NEURONTIN) 100 MG capsule      Meds ordered this encounter  Medications  . Oxycodone HCl 10 MG TABS    Sig: Take 1 tablet (10 mg total) by mouth every 4 (four) hours as needed.    Dispense:  60 tablet    Refill:  0    Order Specific Question:   Supervising Provider    Answer:   [name] [id number]  .  gabapentin (NEURONTIN) 100 MG capsule    Sig: Take 1 capsule (100 mg total) by mouth at bedtime.    Dispense:  30 capsule    Refill:  3    Order Specific Question:   Supervising Provider    Answer:   [name] [id number]    Arm pain, diffuse, right Right arm pain is localized along fistula.  Recommend follow-up with vascular surgeon.  Will manage pain with oxycodone 10 mg every 4 hours as needed for severe pain. Also, gabapentin 100 mg at bedtime. Patient will follow-up with this provider in 1 week for medication management. - Oxycodone HCl 10 MG TABS; Take 1 tablet (10 mg total) by mouth every 4 (four) hours as needed.  Dispense: 60 tablet; Refill: 0 - gabapentin (NEURONTIN) 100 MG capsule; Take 1 capsule (100 mg total) by mouth at bedtime.  Dispense: 30 capsule; Refill: 3  End stage renal disease: Discussed the importance of completing dialysis.  Also, patient warrants first available appointment with nephrologist for further evaluation of this problem.  Advised patient to return to dialysis as scheduled on tomorrow.   Follow-up: Return in about 1 week (around 05/05/2019).    [name]  APRN, MSN, FNP-C [facility] [address] [place], [place] [address] [phone]

## 2019-05-05 ENCOUNTER — Encounter: Payer: Self-pay | Admitting: Family Medicine

## 2019-05-05 ENCOUNTER — Ambulatory Visit (INDEPENDENT_AMBULATORY_CARE_PROVIDER_SITE_OTHER): Payer: PPO | Admitting: Family Medicine

## 2019-05-05 ENCOUNTER — Other Ambulatory Visit: Payer: Self-pay

## 2019-05-05 DIAGNOSIS — M79601 Pain in right arm: Secondary | ICD-10-CM | POA: Diagnosis not present

## 2019-05-05 NOTE — Progress Notes (Addendum)
Patient [facility]   Virtual Visit via Telephone Note  I connected with Random D Nyman on 05/05/19 at 10:00 AM EST by telephone and verified that I am speaking with the correct person using two identifiers.   I discussed the limitations, risks, security and privacy concerns of performing an evaluation and management service by telephone and the availability of in person appointments. I also discussed with the patient that there may be a patient responsible charge related to this service. The patient expressed understanding and agreed to proceed. Past Medical History:  Diagnosis Date  . Blood dyscrasia   . Blood transfusion    "I've had a bunch of them"  . CHF (congestive heart failure) ([place])    followed by hf clinic  . Chronic kidney disease (CKD), stage III (moderate)   . Gout   . [id number])    "probably weekly" (01/13/2014)  . Legally blind   . Shortness of breath dyspnea   . Sickle cell disease, type SS ([place])   . Swelling abdomen   . Swelling of extremity, left   . Swelling of extremity, right    Past Surgical History:  Procedure Laterality Date  . BASCILIC VEIN TRANSPOSITION Right 04/12/2014   Procedure: BASILIC VEIN TRANSPOSITION;  Surgeon: Elam Dutch, MD;  Location: [place];  Service: Vascular;  Laterality: Right;  . CARDIAC CATHETERIZATION  05/2011  . CARDIOVERSION N/A 06/05/2018   Procedure: CARDIOVERSION;  Surgeon: Jolaine Artist, MD;  Location: [place];  Service: Cardiovascular;  Laterality: N/A;  . COLONOSCOPY WITH PROPOFOL N/A 09/24/2017   Procedure: COLONOSCOPY WITH PROPOFOL;  Surgeon: Jackquline Denmark, MD;  Location[facility] ENDOSCOPY;  Service: Endoscopy;  Laterality: N/A;  . ESOPHAGOGASTRODUODENOSCOPY N/A 09/19/2017   Procedure: ESOPHAGOGASTRODUODENOSCOPY (EGD);  Surgeon: Jackquline Denmark, MD;  Location: [facility] ENDOSCOPY;  Service: Endoscopy;  Laterality: N/A;  . LEFT AND RIGHT HEART CATHETERIZATION WITH CORONARY  ANGIOGRAM N/A 06/11/2011   Procedure: LEFT AND RIGHT HEART CATHETERIZATION WITH CORONARY ANGIOGRAM;  Surgeon: Larey Dresser, MD;  Location: [facility] CATH LAB;  Service: Cardiovascular;  Laterality: N/A;  . TEE WITHOUT CARDIOVERSION N/A 12/27/2015   Procedure: TRANSESOPHAGEAL ECHOCARDIOGRAM (TEE);  Surgeon: Sueanne Margarita, MD;  Location: [facility] ENDOSCOPY;  Service: Cardiovascular;  Laterality: N/A;   Immunization History  Administered Date(s) Administered  . Influenza Inj Mdck Quad Pf 03/21/2018  . Influenza,inj,Quad PF,6+ Mos 03/17/2013, 03/08/2014  . Pneumococcal Conjugate-13 12/02/2014  . Pneumococcal Polysaccharide-23 02/10/2013  . Tdap 09/24/2013  No Known Allergies  Outpatient Encounter Medications as of 05/05/2019  Medication Sig  . allopurinol (ZYLOPRIM) 100 MG tablet Take 100 mg by mouth 2 (two) times daily.  . calcitRIOL (ROCALTROL) 0.5 MCG capsule Take 0.5 mcg by mouth daily.  . carvedilol (COREG) 12.5 MG tablet TAKE 1 TABLET BY MOUTH 2 (TWO) TIMES DAILY WITH A MEAL.  Marland Kitchen ELIQUIS 5 MG TABS tablet TAKE 1 TABLET BY MOUTH TWICE A DAY (Patient taking differently: Take 5 mg by mouth 2 (two) times daily. )  . folic acid (FOLVITE) 1 MG tablet TAKE 1 TABLET (1 MG TOTAL) BY MOUTH DAILY. (Patient taking differently: Take 1 mg by mouth daily. )  . gabapentin (NEURONTIN) 100 MG capsule Take 1 capsule (100 mg total) by mouth at bedtime.  . hydroxyurea (HYDREA) 500 MG capsule TAKE ONE CAPSULE BY MOUTH EVERY DAY WITH FOOD TO MINIMIZE GI SIDE EFFECTS  . isosorbide mononitrate (IMDUR) 30 MG 24 hr tablet Take 1 tablet (30 mg total) by mouth  daily.  . Oxycodone HCl 10 MG TABS Take 1 tablet (10 mg total) by mouth every 4 (four) hours as needed.  . sevelamer carbonate (RENVELA) 800 MG tablet   . sildenafil (VIAGRA) 50 MG tablet Take 1 tablet (50 mg total) by mouth daily as needed for erectile dysfunction. Do not take same day as Isosorbide  . [DISCONTINUED] torsemide (DEMADEX) 100 MG tablet TAKE 1 TABLET BY  MOUTH EVERY DAY (Patient taking differently: Take 100 mg by mouth 2 (two) times daily. )  . hydrALAZINE (APRESOLINE) 25 MG tablet Take 1 tablet (25 mg total) by mouth 2 (two) times daily.   No facility-administered encounter medications on file as of 05/05/2019.     History of Present Illness: Izaiha Arrona, a 63 year old male with a medical history significant for sickle cell disease, hypertension, chronic heart failure with preserved ejection fraction, atrial fibrillation, end-stage renal disease, anemia of chronic disease, vitamin D deficiency, and tobacco dependence is following up by phone for right upper extremity pain.  Patient was treated and evaluated in office on 04/28/2019 for this problem.  Patient has a history of end-stage renal disease and receives dialysis on Monday, Wednesday, and Fridays.  During the session of dialysis, patient experienced intense pain to right upper extremity after the process was initiated.  Dialysis was discontinued immediately and patient was evaluated and sent home.  Fistula has not been utilized since that time.  Patient has an appointment with vein and vascular.  A catheter has been inserted to complete dialysis.  Patient states that right upper extremity pain has improved.  Patient was prescribed oxycodone and gabapentin.  He says that he is not warranted medication over the past 2 days.  He is not having pain at present.  He denies headache, chest pain, nausea, vomiting, or diarrhea.   Observations/Objective: Patient says that swelling and redness has dissipated.  Assessment and Plan: Arm pain, diffuse, right Acute pain and swelling have resolved.  Patient no longer requires pain medication.  He does not warrant further work-up. Patient is under the care of vein and vascular, who is scheduled to reevaluate fistula.  Follow Up Instructions:   Patient advised to follow-up in primary care for sickle cell disease as scheduled. I discussed the  assessment and treatment plan with the patient. The patient was provided an opportunity to ask questions and all were answered. The patient agreed with the plan and demonstrated an understanding of the instructions.   The patient was advised to call back or seek an in-person evaluation if the symptoms worsen or if the condition fails to improve as anticipated.  I provided 6 minutes of non-face-to-face time during this encounter.  [name]  APRN, MSN, FNP-C Patient [facility] [address] [place], [place] [address] [phone]

## 2019-05-06 ENCOUNTER — Other Ambulatory Visit (HOSPITAL_COMMUNITY): Payer: Self-pay

## 2019-05-06 MED ORDER — TORSEMIDE 100 MG PO TABS: 100.0000 mg | ORAL_TABLET | Freq: Every day | ORAL | 1 refills | Status: DC

## 2019-05-08 DIAGNOSIS — T82898A Other specified complication of vascular prosthetic devices, implants and grafts, initial encounter: Secondary | ICD-10-CM | POA: Diagnosis not present

## 2019-05-08 DIAGNOSIS — Z992 Dependence on renal dialysis: Secondary | ICD-10-CM | POA: Diagnosis not present

## 2019-05-08 DIAGNOSIS — N186 End stage renal disease: Secondary | ICD-10-CM | POA: Diagnosis not present

## 2019-05-18 DIAGNOSIS — N186 End stage renal disease: Secondary | ICD-10-CM | POA: Diagnosis not present

## 2019-05-18 DIAGNOSIS — I158 Other secondary hypertension: Secondary | ICD-10-CM | POA: Diagnosis not present

## 2019-05-18 DIAGNOSIS — Z992 Dependence on renal dialysis: Secondary | ICD-10-CM | POA: Diagnosis not present

## 2019-05-20 ENCOUNTER — Other Ambulatory Visit (HOSPITAL_COMMUNITY): Payer: Self-pay

## 2019-05-20 DIAGNOSIS — D689 Coagulation defect, unspecified: Secondary | ICD-10-CM | POA: Diagnosis not present

## 2019-05-20 DIAGNOSIS — N186 End stage renal disease: Secondary | ICD-10-CM | POA: Diagnosis not present

## 2019-05-20 DIAGNOSIS — D631 Anemia in chronic kidney disease: Secondary | ICD-10-CM | POA: Diagnosis not present

## 2019-05-20 DIAGNOSIS — N2581 Secondary hyperparathyroidism of renal origin: Secondary | ICD-10-CM | POA: Diagnosis not present

## 2019-05-20 DIAGNOSIS — D509 Iron deficiency anemia, unspecified: Secondary | ICD-10-CM | POA: Diagnosis not present

## 2019-05-20 DIAGNOSIS — Z992 Dependence on renal dialysis: Secondary | ICD-10-CM | POA: Diagnosis not present

## 2019-05-20 MED ORDER — TORSEMIDE 100 MG PO TABS: 100.0000 mg | ORAL_TABLET | Freq: Every day | ORAL | 1 refills | Status: DC

## 2019-05-27 ENCOUNTER — Telehealth (HOSPITAL_COMMUNITY): Payer: Self-pay

## 2019-05-27 NOTE — Telephone Encounter (Signed)
Pt called for refill on cialis.  Pt not on medication as per med list.  LM on patient vm would have to refer to primary physician as Dr Aundra Dubin is not ordering provider for that medication.

## 2019-06-02 ENCOUNTER — Telehealth: Payer: Self-pay | Admitting: Internal Medicine

## 2019-06-03 ENCOUNTER — Other Ambulatory Visit: Payer: Self-pay | Admitting: Family Medicine

## 2019-06-03 DIAGNOSIS — M79601 Pain in right arm: Secondary | ICD-10-CM

## 2019-06-03 MED ORDER — OXYCODONE HCL 10 MG PO TABS: 10.0000 mg | ORAL_TABLET | ORAL | 0 refills | Status: DC | PRN

## 2019-06-03 NOTE — Progress Notes (Unsigned)
Reviewed PDMP prior to prescribing opiate medications, no inconsistencies noted.   Meds ordered this encounter  Medications  . Oxycodone HCl 10 MG TABS    Sig: Take 1 tablet (10 mg total) by mouth every 4 (four) hours as needed.    Dispense:  60 tablet    Refill:  0    Order Specific Question:   Supervising Provider    Answer:   [name] [id number]    [name]  APRN, MSN, FNP-C Patient [facility] [address] [place], [place] [address] [phone]

## 2019-06-05 ENCOUNTER — Telehealth: Payer: Self-pay | Admitting: Family Medicine

## 2019-06-05 ENCOUNTER — Other Ambulatory Visit: Payer: Self-pay | Admitting: Family Medicine

## 2019-06-05 MED ORDER — SILDENAFIL CITRATE 50 MG PO TABS: 50.0000 mg | ORAL_TABLET | Freq: Every day | ORAL | 0 refills | Status: DC | PRN

## 2019-06-05 NOTE — Progress Notes (Signed)
Meds ordered this encounter  Medications  . sildenafil (VIAGRA) 50 MG tablet    Sig: Take 1 tablet (50 mg total) by mouth daily as needed for erectile dysfunction. Do not take same day as Isosorbide    Dispense:  4 tablet    Refill:  0    Order Specific Question:   Supervising Provider    Answer:   [name] [id number]    [name]  APRN, MSN, FNP-C Patient [facility] [address] [place], [place] [address] [phone]

## 2019-06-05 NOTE — Telephone Encounter (Signed)
hollis 

## 2019-06-05 NOTE — Telephone Encounter (Signed)
done

## 2019-06-05 NOTE — Telephone Encounter (Signed)
holl 

## 2019-06-18 DIAGNOSIS — I158 Other secondary hypertension: Secondary | ICD-10-CM | POA: Diagnosis not present

## 2019-06-18 DIAGNOSIS — Z992 Dependence on renal dialysis: Secondary | ICD-10-CM | POA: Diagnosis not present

## 2019-06-18 DIAGNOSIS — N186 End stage renal disease: Secondary | ICD-10-CM | POA: Diagnosis not present

## 2019-06-20 DIAGNOSIS — Z992 Dependence on renal dialysis: Secondary | ICD-10-CM | POA: Diagnosis not present

## 2019-06-20 DIAGNOSIS — N186 End stage renal disease: Secondary | ICD-10-CM | POA: Diagnosis not present

## 2019-06-20 DIAGNOSIS — D509 Iron deficiency anemia, unspecified: Secondary | ICD-10-CM | POA: Diagnosis not present

## 2019-06-20 DIAGNOSIS — D631 Anemia in chronic kidney disease: Secondary | ICD-10-CM | POA: Diagnosis not present

## 2019-06-20 DIAGNOSIS — D689 Coagulation defect, unspecified: Secondary | ICD-10-CM | POA: Diagnosis not present

## 2019-06-20 DIAGNOSIS — N2581 Secondary hyperparathyroidism of renal origin: Secondary | ICD-10-CM | POA: Diagnosis not present

## 2019-06-25 ENCOUNTER — Other Ambulatory Visit (HOSPITAL_COMMUNITY): Payer: Self-pay | Admitting: Internal Medicine

## 2019-07-14 ENCOUNTER — Ambulatory Visit: Payer: PPO | Attending: Internal Medicine

## 2019-07-18 DIAGNOSIS — R5381 Other malaise: Secondary | ICD-10-CM | POA: Diagnosis not present

## 2019-07-18 DIAGNOSIS — Z743 Need for continuous supervision: Secondary | ICD-10-CM | POA: Diagnosis not present

## 2019-07-18 DIAGNOSIS — U071 COVID-19: Secondary | ICD-10-CM | POA: Diagnosis not present

## 2019-07-18 DIAGNOSIS — R279 Unspecified lack of coordination: Secondary | ICD-10-CM | POA: Diagnosis not present

## 2019-07-19 DIAGNOSIS — Z992 Dependence on renal dialysis: Secondary | ICD-10-CM | POA: Diagnosis not present

## 2019-07-19 DIAGNOSIS — I158 Other secondary hypertension: Secondary | ICD-10-CM | POA: Diagnosis not present

## 2019-07-19 DIAGNOSIS — N186 End stage renal disease: Secondary | ICD-10-CM | POA: Diagnosis not present

## 2019-07-21 ENCOUNTER — Other Ambulatory Visit: Payer: Self-pay | Admitting: Family Medicine

## 2019-07-21 ENCOUNTER — Telehealth: Payer: Self-pay | Admitting: Family Medicine

## 2019-07-21 DIAGNOSIS — D689 Coagulation defect, unspecified: Secondary | ICD-10-CM | POA: Diagnosis not present

## 2019-07-21 DIAGNOSIS — D509 Iron deficiency anemia, unspecified: Secondary | ICD-10-CM | POA: Diagnosis not present

## 2019-07-21 DIAGNOSIS — M79601 Pain in right arm: Secondary | ICD-10-CM

## 2019-07-21 DIAGNOSIS — N2581 Secondary hyperparathyroidism of renal origin: Secondary | ICD-10-CM | POA: Diagnosis not present

## 2019-07-21 DIAGNOSIS — Z992 Dependence on renal dialysis: Secondary | ICD-10-CM | POA: Diagnosis not present

## 2019-07-21 DIAGNOSIS — N186 End stage renal disease: Secondary | ICD-10-CM | POA: Diagnosis not present

## 2019-07-21 DIAGNOSIS — U071 COVID-19: Secondary | ICD-10-CM | POA: Diagnosis not present

## 2019-07-21 DIAGNOSIS — Z743 Need for continuous supervision: Secondary | ICD-10-CM | POA: Diagnosis not present

## 2019-07-21 DIAGNOSIS — R279 Unspecified lack of coordination: Secondary | ICD-10-CM | POA: Diagnosis not present

## 2019-07-21 DIAGNOSIS — R5381 Other malaise: Secondary | ICD-10-CM | POA: Diagnosis not present

## 2019-07-21 DIAGNOSIS — D631 Anemia in chronic kidney disease: Secondary | ICD-10-CM | POA: Diagnosis not present

## 2019-07-21 MED ORDER — OXYCODONE HCL 10 MG PO TABS: 10.0000 mg | ORAL_TABLET | ORAL | 0 refills | Status: DC | PRN

## 2019-07-21 NOTE — Progress Notes (Signed)
Reviewed PDMP substance reporting system prior to prescribing opiate medications. No inconsistencies noted.  Meds ordered this encounter  Medications   Oxycodone HCl 10 MG TABS    Sig: Take 1 tablet (10 mg total) by mouth every 4 (four) hours as needed.    Dispense:  60 tablet    Refill:  0    Order Specific Question:   Supervising Provider    Answer:   JEGEDE, OLUGBEMIGA E [[id number]]   Patrick [name] Patrick Culbertson  APRN, MSN, FNP-C [facility] [address]  [place], [place] [address] [phone]  

## 2019-07-22 NOTE — Telephone Encounter (Signed)
Done

## 2019-07-23 DIAGNOSIS — Z992 Dependence on renal dialysis: Secondary | ICD-10-CM | POA: Diagnosis not present

## 2019-07-23 DIAGNOSIS — R279 Unspecified lack of coordination: Secondary | ICD-10-CM | POA: Diagnosis not present

## 2019-07-23 DIAGNOSIS — Z743 Need for continuous supervision: Secondary | ICD-10-CM | POA: Diagnosis not present

## 2019-07-23 DIAGNOSIS — R5381 Other malaise: Secondary | ICD-10-CM | POA: Diagnosis not present

## 2019-07-23 DIAGNOSIS — N186 End stage renal disease: Secondary | ICD-10-CM | POA: Diagnosis not present

## 2019-07-23 DIAGNOSIS — U071 COVID-19: Secondary | ICD-10-CM | POA: Diagnosis not present

## 2019-07-23 DIAGNOSIS — N2581 Secondary hyperparathyroidism of renal origin: Secondary | ICD-10-CM | POA: Diagnosis not present

## 2019-08-08 ENCOUNTER — Other Ambulatory Visit: Payer: Self-pay | Admitting: Family Medicine

## 2019-08-08 DIAGNOSIS — D571 Sickle-cell disease without crisis: Secondary | ICD-10-CM

## 2019-08-16 DIAGNOSIS — N186 End stage renal disease: Secondary | ICD-10-CM | POA: Diagnosis not present

## 2019-08-16 DIAGNOSIS — Z992 Dependence on renal dialysis: Secondary | ICD-10-CM | POA: Diagnosis not present

## 2019-08-16 DIAGNOSIS — I158 Other secondary hypertension: Secondary | ICD-10-CM | POA: Diagnosis not present

## 2019-08-17 DIAGNOSIS — N186 End stage renal disease: Secondary | ICD-10-CM | POA: Diagnosis not present

## 2019-08-17 DIAGNOSIS — D509 Iron deficiency anemia, unspecified: Secondary | ICD-10-CM | POA: Diagnosis not present

## 2019-08-17 DIAGNOSIS — D689 Coagulation defect, unspecified: Secondary | ICD-10-CM | POA: Diagnosis not present

## 2019-08-17 DIAGNOSIS — D631 Anemia in chronic kidney disease: Secondary | ICD-10-CM | POA: Diagnosis not present

## 2019-08-17 DIAGNOSIS — N2581 Secondary hyperparathyroidism of renal origin: Secondary | ICD-10-CM | POA: Diagnosis not present

## 2019-08-17 DIAGNOSIS — Z992 Dependence on renal dialysis: Secondary | ICD-10-CM | POA: Diagnosis not present

## 2019-08-20 DIAGNOSIS — Z452 Encounter for adjustment and management of vascular access device: Secondary | ICD-10-CM | POA: Diagnosis not present

## 2019-08-24 ENCOUNTER — Other Ambulatory Visit: Payer: Self-pay | Admitting: Family Medicine

## 2019-08-24 ENCOUNTER — Telehealth: Payer: Self-pay | Admitting: Family Medicine

## 2019-08-24 DIAGNOSIS — M79601 Pain in right arm: Secondary | ICD-10-CM

## 2019-08-24 MED ORDER — OXYCODONE HCL 10 MG PO TABS: 10.0000 mg | ORAL_TABLET | ORAL | 0 refills | Status: DC | PRN

## 2019-08-24 NOTE — Progress Notes (Unsigned)
Reviewed PDMP substance reporting system prior to prescribing opiate medications. No inconsistencies noted.  Meds ordered this encounter  Medications   Oxycodone HCl 10 MG TABS    Sig: Take 1 tablet (10 mg total) by mouth every 4 (four) hours as needed.    Dispense:  60 tablet    Refill:  0    Order Specific Question:   Supervising Provider    Answer:   JEGEDE, OLUGBEMIGA E [[id number]]   Patrick [name] Patrick Pettaway  APRN, MSN, FNP-C [facility] [address]  [place], [place] [address] [phone]  

## 2019-08-25 NOTE — Telephone Encounter (Signed)
Done

## 2019-09-03 ENCOUNTER — Other Ambulatory Visit: Payer: Self-pay | Admitting: Family Medicine

## 2019-09-03 DIAGNOSIS — M79601 Pain in right arm: Secondary | ICD-10-CM

## 2019-09-07 ENCOUNTER — Ambulatory Visit: Payer: PPO

## 2019-09-11 ENCOUNTER — Inpatient Hospital Stay (HOSPITAL_COMMUNITY)
Admission: EM | Admit: 2019-09-11 | Discharge: 2019-09-13 | DRG: 291 | Disposition: A | Discharge status: Home / Self Care | Payer: PPO | Attending: Internal Medicine | Admitting: Internal Medicine

## 2019-09-11 ENCOUNTER — Other Ambulatory Visit: Payer: Self-pay

## 2019-09-11 ENCOUNTER — Emergency Department (HOSPITAL_COMMUNITY): Payer: PPO

## 2019-09-11 ENCOUNTER — Encounter (HOSPITAL_COMMUNITY): Payer: Self-pay | Admitting: Pediatrics

## 2019-09-11 DIAGNOSIS — D696 Thrombocytopenia, unspecified: Secondary | ICD-10-CM | POA: Diagnosis not present

## 2019-09-11 DIAGNOSIS — Z87891 Personal history of nicotine dependence: Secondary | ICD-10-CM | POA: Diagnosis not present

## 2019-09-11 DIAGNOSIS — I4891 Unspecified atrial fibrillation: Secondary | ICD-10-CM | POA: Diagnosis not present

## 2019-09-11 DIAGNOSIS — Z7901 Long term (current) use of anticoagulants: Secondary | ICD-10-CM

## 2019-09-11 DIAGNOSIS — D631 Anemia in chronic kidney disease: Secondary | ICD-10-CM | POA: Diagnosis present

## 2019-09-11 DIAGNOSIS — Z8249 Family history of ischemic heart disease and other diseases of the circulatory system: Secondary | ICD-10-CM | POA: Diagnosis not present

## 2019-09-11 DIAGNOSIS — I5021 Acute systolic (congestive) heart failure: Secondary | ICD-10-CM | POA: Diagnosis not present

## 2019-09-11 DIAGNOSIS — R188 Other ascites: Secondary | ICD-10-CM | POA: Diagnosis present

## 2019-09-11 DIAGNOSIS — I081 Rheumatic disorders of both mitral and tricuspid valves: Secondary | ICD-10-CM | POA: Diagnosis present

## 2019-09-11 DIAGNOSIS — H548 Legal blindness, as defined in USA: Secondary | ICD-10-CM | POA: Diagnosis not present

## 2019-09-11 DIAGNOSIS — N250 Renal osteodystrophy: Secondary | ICD-10-CM | POA: Diagnosis not present

## 2019-09-11 DIAGNOSIS — I132 Hypertensive heart and chronic kidney disease with heart failure and with stage 5 chronic kidney disease, or end stage renal disease: Principal | ICD-10-CM | POA: Diagnosis present

## 2019-09-11 DIAGNOSIS — N2581 Secondary hyperparathyroidism of renal origin: Secondary | ICD-10-CM | POA: Diagnosis not present

## 2019-09-11 DIAGNOSIS — E8770 Fluid overload, unspecified: Secondary | ICD-10-CM | POA: Diagnosis not present

## 2019-09-11 DIAGNOSIS — Z992 Dependence on renal dialysis: Secondary | ICD-10-CM

## 2019-09-11 DIAGNOSIS — Z8616 Personal history of COVID-19: Secondary | ICD-10-CM | POA: Diagnosis not present

## 2019-09-11 DIAGNOSIS — N186 End stage renal disease: Secondary | ICD-10-CM | POA: Diagnosis present

## 2019-09-11 DIAGNOSIS — I272 Pulmonary hypertension, unspecified: Secondary | ICD-10-CM | POA: Diagnosis not present

## 2019-09-11 DIAGNOSIS — I130 Hypertensive heart and chronic kidney disease with heart failure and stage 1 through stage 4 chronic kidney disease, or unspecified chronic kidney disease: Secondary | ICD-10-CM | POA: Diagnosis not present

## 2019-09-11 DIAGNOSIS — E8889 Other specified metabolic disorders: Secondary | ICD-10-CM | POA: Diagnosis not present

## 2019-09-11 DIAGNOSIS — D571 Sickle-cell disease without crisis: Secondary | ICD-10-CM | POA: Diagnosis not present

## 2019-09-11 DIAGNOSIS — R0602 Shortness of breath: Secondary | ICD-10-CM | POA: Diagnosis not present

## 2019-09-11 DIAGNOSIS — Z79899 Other long term (current) drug therapy: Secondary | ICD-10-CM

## 2019-09-11 DIAGNOSIS — I428 Other cardiomyopathies: Secondary | ICD-10-CM | POA: Diagnosis not present

## 2019-09-11 DIAGNOSIS — I5022 Chronic systolic (congestive) heart failure: Secondary | ICD-10-CM | POA: Diagnosis present

## 2019-09-11 DIAGNOSIS — I5023 Acute on chronic systolic (congestive) heart failure: Secondary | ICD-10-CM | POA: Diagnosis present

## 2019-09-11 DIAGNOSIS — I4821 Permanent atrial fibrillation: Secondary | ICD-10-CM | POA: Diagnosis present

## 2019-09-11 DIAGNOSIS — M109 Gout, unspecified: Secondary | ICD-10-CM | POA: Diagnosis not present

## 2019-09-11 DIAGNOSIS — Z20822 Contact with and (suspected) exposure to covid-19: Secondary | ICD-10-CM | POA: Diagnosis not present

## 2019-09-11 LAB — TROPONIN I (HIGH SENSITIVITY)
Troponin I (High Sensitivity): 13 ng/L (ref <18)
Troponin I (High Sensitivity): 13 ng/L (ref <18)

## 2019-09-11 LAB — CBC
HCT: 25.7 % — ABNORMAL LOW (ref 39.0–52.0)
Hemoglobin: 9.6 g/dL — ABNORMAL LOW (ref 13.0–17.0)
MCH: 43.6 pg — ABNORMAL HIGH (ref 26.0–34.0)
MCHC: 37.4 g/dL — ABNORMAL HIGH (ref 30.0–36.0)
MCV: 116.8 fL — ABNORMAL HIGH (ref 80.0–100.0)
Platelets: 138 10*3/uL — ABNORMAL LOW (ref 150–400)
RBC: 2.2 MIL/uL — ABNORMAL LOW (ref 4.22–5.81)
RDW: 14.1 % (ref 11.5–15.5)
WBC: 5 10*3/uL (ref 4.0–10.5)
nRBC: 3.4 % — ABNORMAL HIGH (ref 0.0–0.2)

## 2019-09-11 LAB — PROTIME-INR
INR: 1.4 — ABNORMAL HIGH (ref 0.8–1.2)
Prothrombin Time: 17.4 seconds — ABNORMAL HIGH (ref 11.4–15.2)

## 2019-09-11 LAB — BASIC METABOLIC PANEL
Anion gap: 12 (ref 5–15)
BUN: 12 mg/dL (ref 8–23)
CO2: 32 mmol/L (ref 22–32)
Calcium: 8.8 mg/dL — ABNORMAL LOW (ref 8.9–10.3)
Chloride: 95 mmol/L — ABNORMAL LOW (ref 98–111)
Creatinine, Ser: 2.99 mg/dL — ABNORMAL HIGH (ref 0.61–1.24)
GFR calc Af Amer: 25 mL/min — ABNORMAL LOW (ref >60)
GFR calc non Af Amer: 22 mL/min — ABNORMAL LOW (ref >60)
Glucose, Bld: 107 mg/dL — ABNORMAL HIGH (ref 70–99)
Potassium: 3.4 mmol/L — ABNORMAL LOW (ref 3.5–5.1)
Sodium: 139 mmol/L (ref 135–145)

## 2019-09-11 LAB — BRAIN NATRIURETIC PEPTIDE: B Natriuretic Peptide: 1836.7 pg/mL — ABNORMAL HIGH (ref 0.0–100.0)

## 2019-09-11 MED ORDER — FUROSEMIDE 10 MG/ML IJ SOLN
20.0000 mg | Freq: Once | INTRAMUSCULAR | Status: AC
  Administered 2019-09-11: 20 mg via INTRAVENOUS
  Filled 2019-09-11: qty 2

## 2019-09-11 MED ORDER — FUROSEMIDE 10 MG/ML IJ SOLN
80.0000 mg | Freq: Two times a day (BID) | INTRAMUSCULAR | Status: DC
  Administered 2019-09-12 – 2019-09-13 (×2): 80 mg via INTRAVENOUS
  Filled 2019-09-11 (×2): qty 8

## 2019-09-11 MED ORDER — POTASSIUM CHLORIDE CRYS ER 20 MEQ PO TBCR
20.0000 meq | EXTENDED_RELEASE_TABLET | Freq: Once | ORAL | Status: AC
  Administered 2019-09-11: 20 meq via ORAL
  Filled 2019-09-11: qty 1

## 2019-09-11 MED ORDER — SODIUM CHLORIDE 0.9% FLUSH
3.0000 mL | Freq: Once | INTRAVENOUS | Status: AC
  Administered 2019-09-11: 3 mL via INTRAVENOUS

## 2019-09-11 MED ORDER — FUROSEMIDE 10 MG/ML IJ SOLN
60.0000 mg | Freq: Once | INTRAMUSCULAR | Status: AC
  Administered 2019-09-11: 60 mg via INTRAVENOUS
  Filled 2019-09-11: qty 6

## 2019-09-11 NOTE — H&P (Signed)
History and Physical    [name] NKN:[id number] DOB: 1955-11-14 DOA: 09/11/2019  PCP: [name], MD   Patient coming from: Home   Chief Complaint: Fatigue, SOB, abdominal swelling   HPI: Patrick [name] Surgeon is a 64 y.o. male with medical history significant for sickle cell anemia, nonischemic cardiomyopathy, atrial fibrillation with history of GI bleed on warfarin now on Eliquis, and ESRD, now presenting to the emergency department with progressive fatigue, abdominal distention, and orthopnea.  Patient reports that he has been adhering to his fluid and dietary restrictions, completed hemodialysis today without incident, but continues to feel more short of breath.  He has been using 10 pillows at home in order to sleep near upright.  Denies any leg swelling but reports that his legs never swell and he tends to accumulate fluid in his abdomen which she has been doing.  He denies any fevers, chills, cough, or chest pain.  He has been taking 150 mg of torsemide at home instead of his usual 100, but continues to worsen despite this.  ED Course: Upon arrival to the ED, patient is found to be afebrile, saturating adequately on room air while at rest, and with stable blood pressure.  EKG features atrial fibrillation.  Chest x-ray with cardiomegaly and mild interstitial edema.  Chemistry panel notable for potassium 3.4, normal bicarbonate, and normal BUN.  CBC with stable microcytic anemia and mild thrombocytopenia.  High-sensitivity troponin is normal x2.  BNP elevated to 1831.  Patient was given 20 mg IV Lasix in the ED and hospitalist called to admit.  Review of Systems:  All other systems reviewed and apart from HPI, are negative.  Past Medical History:  Diagnosis Date  . Blood dyscrasia   . Blood transfusion    "I've had a bunch of them"  . CHF (congestive heart failure) ([place])    followed by hf clinic  . Chronic kidney disease (CKD), stage III (moderate)   . Dialysis  patient ([place]) 04/2019   AV fistula (right arm)  . Gout   . FXTKWIOX(735.3)    "probably weekly" (01/13/2014)  . Legally blind   . Shortness of breath dyspnea   . Sickle cell disease, type SS ([place])   . Swelling abdomen   . Swelling of extremity, left   . Swelling of extremity, right     Past Surgical History:  Procedure Laterality Date  . BASCILIC VEIN TRANSPOSITION Right 04/12/2014   Procedure: BASILIC VEIN TRANSPOSITION;  Surgeon: Elam Dutch, MD;  Location: [place];  Service: Vascular;  Laterality: Right;  . CARDIAC CATHETERIZATION  05/2011  . CARDIOVERSION N/A 06/05/2018   Procedure: CARDIOVERSION;  Surgeon: Jolaine Artist, MD;  Location: [place];  Service: Cardiovascular;  Laterality: N/A;  . COLONOSCOPY WITH PROPOFOL N/A 09/24/2017   Procedure: COLONOSCOPY WITH PROPOFOL;  Surgeon: Jackquline Denmark, MD;  Location: [facility] ENDOSCOPY;  Service: Endoscopy;  Laterality: N/A;  . ESOPHAGOGASTRODUODENOSCOPY N/A 09/19/2017   Procedure: ESOPHAGOGASTRODUODENOSCOPY (EGD);  Surgeon: Jackquline Denmark, MD;  Location: [facility] ENDOSCOPY;  Service: Endoscopy;  Laterality: N/A;  . LEFT AND RIGHT HEART CATHETERIZATION WITH CORONARY ANGIOGRAM N/A 06/11/2011   Procedure: LEFT AND RIGHT HEART CATHETERIZATION WITH CORONARY ANGIOGRAM;  Surgeon: Larey Dresser, MD;  Location: [facility] CATH LAB;  Service: Cardiovascular;  Laterality: N/A;  . TEE WITHOUT CARDIOVERSION N/A 12/27/2015   Procedure: TRANSESOPHAGEAL ECHOCARDIOGRAM (TEE);  Surgeon: Sueanne Margarita, MD;  Location: [facility] ENDOSCOPY;  Service: Cardiovascular;  Laterality: N/A;     reports that he  quit smoking about 3 years ago. His smoking use included cigarettes. He has a 10.25 pack-year smoking history. He has never used smokeless tobacco. He reports current alcohol use of about 3.0 standard drinks of alcohol per week. He reports that he does not use drugs.  No Known Allergies  Family History  Problem Relation Age of Onset  . Alcohol abuse Mother   . Liver  disease Mother   . Cirrhosis Mother   . Heart attack Mother      Prior to Admission medications   Medication Sig Start Date End Date Taking? Authorizing Provider  allopurinol (ZYLOPRIM) 100 MG tablet Take 100 mg by mouth 2 (two) times daily.   Yes [provider]  calcitRIOL (ROCALTROL) 0.5 MCG capsule Take 0.5 mcg by mouth See admin instructions. Tues Thurs Sat and Sun 04/15/18  Yes [provider]  carvedilol (COREG) 12.5 MG tablet TAKE 1 TABLET BY MOUTH 2 (TWO) TIMES DAILY WITH A MEAL. Patient taking differently: Take 12.5 mg by mouth 2 (two) times daily with a meal.  04/15/19  Yes Bensimhon, Shaune Pascal, MD  ELIQUIS 5 MG TABS tablet TAKE 1 TABLET BY MOUTH TWICE A DAY Patient taking differently: Take 5 mg by mouth 2 (two) times daily.  06/25/19  Yes Bensimhon, Shaune Pascal, MD  folic acid (FOLVITE) 1 MG tablet TAKE 1 TABLET BY MOUTH EVERY DAY 08/10/19  Yes Dorena Dew, FNP  gabapentin (NEURONTIN) 100 MG capsule TAKE 1 CAPSULE BY MOUTH AT BEDTIME. 09/03/19  Yes Dorena Dew, FNP  hydrALAZINE (APRESOLINE) 25 MG tablet Take 1 tablet (25 mg total) by mouth 2 (two) times daily. 10/10/17 09/11/19 Yes Bensimhon, Shaune Pascal, MD  hydroxyurea (HYDREA) 500 MG capsule TAKE ONE CAPSULE BY MOUTH EVERY DAY WITH FOOD TO MINIMIZE GI SIDE EFFECTS 02/12/19  Yes Jegede, Olugbemiga E, MD  isosorbide mononitrate (IMDUR) 30 MG 24 hr tablet Take 1 tablet (30 mg total) by mouth daily. 03/10/19  Yes Bensimhon, Shaune Pascal, MD  Oxycodone HCl 10 MG TABS Take 1 tablet (10 mg total) by mouth every 4 (four) hours as needed. Patient taking differently: Take 10 mg by mouth every 4 (four) hours as needed (pain).  08/24/19  Yes Dorena Dew, FNP  sevelamer carbonate (RENVELA) 800 MG tablet Take 800 mg by mouth 3 (three) times daily with meals.  04/28/18  Yes [provider]  torsemide (DEMADEX) 100 MG tablet Take 1 tablet (100 mg total) by mouth daily. Pt needs appointment for future refills. Call  [phone] to schedule 05/20/19  Yes Bensimhon, Shaune Pascal, MD  sildenafil (VIAGRA) 50 MG tablet Take 1 tablet (50 mg total) by mouth daily as needed for erectile dysfunction. Do not take same day as Isosorbide 06/05/19   Dorena Dew, FNP    Physical Exam: Vitals:   09/11/19 2200 09/11/19 2230 09/11/19 2300 09/11/19 2330  BP: 130/74 (!) 141/92 (!) 133/101 132/75  Pulse: 88 78 (!) 42 90  Resp: 14 12 14 15   Temp:      TempSrc:      SpO2: 99% 99% 98% 99%  Weight:      Height:        Constitutional: NAD, calm  Eyes: PERTLA, lids and conjunctivae normal ENMT: Mucous membranes are moist. Posterior pharynx clear of any exudate or lesions.   Neck: normal, supple, no masses, no thyromegaly Respiratory:  no wheezing, dyspnea with speech. No accessory muscle use.  Cardiovascular: Rate ~80 and irregularly irregular. No extremity edema. Neck  veins distended throughout visible course. Abdomen: No distension, no tenderness, soft. Bowel sounds active.  Musculoskeletal: no clubbing / cyanosis. No joint deformity upper and lower extremities.   Skin: no significant rashes, lesions, ulcers. Warm, dry, well-perfused. Neurologic: No gross facial asymmetry. Sensation intact. Moving all extremities.  Psychiatric: Alert and oriented x 3. Very pleasant and cooperative.    Labs and Imaging on Admission: I have personally reviewed following labs and imaging studies  CBC: Recent Labs  Lab 09/11/19 1424  WBC 5.0  HGB 9.6*  HCT 25.7*  MCV 116.8*  PLT 801*   Basic Metabolic Panel: Recent Labs  Lab 09/11/19 1424  NA 139  K 3.4*  CL 95*  CO2 32  GLUCOSE 107*  BUN 12  CREATININE 2.99*  CALCIUM 8.8*   GFR: Estimated Creatinine Clearance: 24.5 mL/min (A) (by C-G formula based on SCr of 2.99 mg/dL (H)). Liver Function Tests: No results for input(s): AST, ALT, ALKPHOS, BILITOT, PROT, ALBUMIN in the last 168 hours. No results for input(s): LIPASE, AMYLASE in the last 168 hours. No results for  input(s): AMMONIA in the last 168 hours. Coagulation Profile: Recent Labs  Lab 09/11/19 1424  INR 1.4*   Cardiac Enzymes: No results for input(s): CKTOTAL, CKMB, CKMBINDEX, TROPONINI in the last 168 hours. BNP (last 3 results) No results for input(s): PROBNP in the last 8760 hours. HbA1C: No results for input(s): HGBA1C in the last 72 hours. CBG: No results for input(s): GLUCAP in the last 168 hours. Lipid Profile: No results for input(s): CHOL, HDL, LDLCALC, TRIG, CHOLHDL, LDLDIRECT in the last 72 hours. Thyroid Function Tests: No results for input(s): TSH, T4TOTAL, FREET4, T3FREE, THYROIDAB in the last 72 hours. Anemia Panel: No results for input(s): VITAMINB12, FOLATE, FERRITIN, TIBC, IRON, RETICCTPCT in the last 72 hours. Urine analysis:    Component Value Date/Time   COLORURINE STRAW (A) 06/02/2018 1900   APPEARANCEUR CLEAR 06/02/2018 1900   APPEARANCEUR Clear 05/31/2017 0902   LABSPEC 1.006 06/02/2018 1900   PHURINE 7.0 06/02/2018 1900   GLUCOSEU NEGATIVE 06/02/2018 1900   HGBUR SMALL (A) 06/02/2018 1900   BILIRUBINUR negative 09/08/2018 1621   BILIRUBINUR neg 06/19/2018 1525   BILIRUBINUR Negative 05/31/2017 0902   KETONESUR negative 09/08/2018 1621   KETONESUR NEGATIVE 06/02/2018 1900   PROTEINUR Positive (A) 06/19/2018 1525   PROTEINUR 30 (A) 06/02/2018 1900   UROBILINOGEN 0.2 09/08/2018 1621   UROBILINOGEN 0.2 09/14/2013 1354   NITRITE Negative 09/08/2018 1621   NITRITE neg 06/19/2018 1525   NITRITE NEGATIVE 06/02/2018 1900   LEUKOCYTESUR Negative 09/08/2018 1621   LEUKOCYTESUR Negative 05/31/2017 0902   Sepsis Labs: @LABRCNTIP (procalcitonin:4,lacticidven:4) )No results found for this or any previous visit (from the past 240 hour(s)).   Radiological Exams on Admission: DG Chest 2 View  Result Date: 09/11/2019 CLINICAL DATA:  Shortness of breath EXAM: CHEST - 2 VIEW COMPARISON:  June 02, 2018 FINDINGS: The heart size is enlarged. There are prominent  interstitial lung markings. Aortic calcifications are noted. There is no pneumothorax. No large pleural effusion. There is no focal infiltrate. No acute osseous abnormality. IMPRESSION: Cardiomegaly with mild interstitial edema. Electronically Signed   By: Constance Holster M.[name].   On: 09/11/2019 14:52    EKG: Independently reviewed. Atrial fibrillation, QTc 503 ms.   Assessment/Plan    1. Acute on chronic systolic CHF  - Presents with increasing DOE, orthopnea, and swelling despite completing HD today and following dietary/fluid restrictions  - CXR with mild IS edema, patient stable at rest  but dyspneic with minimal exertion  - Echo from December 2019 with EF 25-30%, severe LAE, mild AI, severe MR, moderate TR, mild increased PA pressueres - He was given Lasix 20 mg IV in ED, not diuresing much and taking 100 mg torsemide at home, will increase to 80 mg IV Lasix q12h, follow strict I/Os, daily wts, update echo    2. ESRD  - Completed HD on 09/11/19  - No indicate for urgent HD  - Renally-dose medications, continue binders, fluid-restrict    3. Atrial fibrillation  - Rate is well controlled  - Continue Eliquis    4. Sickle cell anemia without crisis  - No pain complaints, continue home medications    DVT prophylaxis: Eliquis  Code Status: Full  Family Communication: Discussed with patient  Disposition Plan: Likely home in 2-3 days if improves with IV diuresis and no longer dyspneic at rest/with minimal exertion  Consults called: None  Admission status: Inpatient    Vianne Bulls, MD [facility] Pager: See [email or web address]  If 7AM-7PM, please contact the daytime attending [email or web address]  09/12/2019, 12:12 AM

## 2019-09-11 NOTE — ED Provider Notes (Signed)
[facility] EMERGENCY DEPARTMENT Provider Note   CSN: [id number] Arrival date & time: 09/11/19  1410     History Chief Complaint  Patient presents with  . Shortness of Breath    Patrick [name] is a 64 y.o. male.  HPI Patient is a 64 year old male with a PMH of hypertension, CHF, CKD, A. fib and ESRD presenting to the ED today due to concern for "fluid buildup."  Patient says that he normally carries fluid in his abdomen and he feels as though he is currently distended.  He is experiencing shortness of breath with progressively worsening dyspnea on exertion for the past week.  He has noticed associated orthopnea and has doubled his torsemide for the past 2 weeks with no improvement in his symptoms.  He denies chest pain, leg swelling, fever, chills, nausea, vomiting or diarrhea.    Past Medical History:  Diagnosis Date  . Blood dyscrasia   . Blood transfusion    "I've had a bunch of them"  . CHF (congestive heart failure) ([place])    followed by hf clinic  . Chronic kidney disease (CKD), stage III (moderate)   . Dialysis patient ([place]) 04/2019   AV fistula (right arm)  . Gout   . XQJJHERD(408.1)    "probably weekly" (01/13/2014)  . Legally blind   . Shortness of breath dyspnea   . Sickle cell disease, type SS ([place])   . Swelling abdomen   . Swelling of extremity, left   . Swelling of extremity, right     Patient Active Problem List   Diagnosis Date Noted  . Hypoxia 06/02/2018  . Acute GI bleeding 09/19/2017  . Symptomatic anemia   . Chronic heart failure with preserved ejection fraction ([place])   . Colon cancer screening 07/16/2017  . Encounter for therapeutic drug monitoring 12/29/2015  . 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  . Atrial fibrillation ([place]) 12/24/2015  . Cough with sputum 10/25/2015  . Muscle tightness-Right arm  05/27/2014  . Arm pain, right 05/06/2014  . End stage renal disease ([place]) 05/06/2014  . Chest pain 01/13/2014  . Vitamin D  deficiency 09/25/2013  . CKD (chronic kidney disease), stage IV ([place]) 09/25/2013  . Elevated uric acid in blood 09/25/2013  . Hb-SS disease without crisis ([place]) 09/25/2013  . Anemia 09/25/2013  . Onychomycosis of toenail 08/27/2013  . Cellulitis 08/27/2013  . Hyperglycemia 08/27/2013  . CHF (congestive heart failure) ([place]) 08/19/2013  . Sickle cell anemia ([place]) 11/21/2012  . Abdominal pain 01/17/2012  . Tobacco abuse 10/29/2011  . NSVT (nonsustained ventricular tachycardia) ([place]) 10/21/2011  . Gout 10/19/2011  . Sickle cell crisis ([place]) 10/17/2011  . Acute on chronic systolic and diastolic heart failure, NYHA class 4 ([place]) 10/17/2011  . HTN (hypertension) 06/07/2011    Past Surgical History:  Procedure Laterality Date  . BASCILIC VEIN TRANSPOSITION Right 04/12/2014   Procedure: BASILIC VEIN TRANSPOSITION;  Surgeon: Elam Dutch, MD;  Location: [facility];  Service: Vascular;  Laterality: Right;  . CARDIAC CATHETERIZATION  05/2011  . CARDIOVERSION N/A 06/05/2018   Procedure: CARDIOVERSION;  Surgeon: Jolaine Artist, MD;  Location: [facility];  Service: Cardiovascular;  Laterality: N/A;  . COLONOSCOPY WITH PROPOFOL N/A 09/24/2017   Procedure: COLONOSCOPY WITH PROPOFOL;  Surgeon: Jackquline Denmark, MD;  Location: [facility] ENDOSCOPY;  Service: Endoscopy;  Laterality: N/A;  . ESOPHAGOGASTRODUODENOSCOPY N/A 09/19/2017   Procedure: ESOPHAGOGASTRODUODENOSCOPY (EGD);  Surgeon: Jackquline Denmark, MD;  Location: [facility] ENDOSCOPY;  Service: Endoscopy;  Laterality: N/A;  . LEFT  AND RIGHT HEART CATHETERIZATION WITH CORONARY ANGIOGRAM N/A 06/11/2011   Procedure: LEFT AND RIGHT HEART CATHETERIZATION WITH CORONARY ANGIOGRAM;  Surgeon: Larey Dresser, MD;  Location: [facility] CATH LAB;  Service: Cardiovascular;  Laterality: N/A;  . TEE WITHOUT CARDIOVERSION N/A 12/27/2015   Procedure: TRANSESOPHAGEAL ECHOCARDIOGRAM (TEE);  Surgeon: Sueanne Margarita, MD;  Location: [facility] ENDOSCOPY;  Service: Cardiovascular;  Laterality: N/A;        Family History  Problem Relation Age of Onset  . Alcohol abuse Mother   . Liver disease Mother   . Cirrhosis Mother   . Heart attack Mother     Social History   Tobacco Use  . Smoking status: Former Smoker    Packs/day: 0.25    Years: 41.00    Pack years: 10.25    Types: Cigarettes    Quit date: 12/08/2015    Years since quitting: 3.7  . Smokeless tobacco: Never Used  Substance Use Topics  . Alcohol use: Yes    Alcohol/week: 3.0 standard drinks    Types: 1 Glasses of wine, 2 Cans of beer per week    Comment: occasional  . Drug use: No    Home Medications Prior to Admission medications   Medication Sig Start Date End Date Taking? Authorizing Provider  allopurinol (ZYLOPRIM) 100 MG tablet Take 100 mg by mouth 2 (two) times daily.    [provider]  calcitRIOL (ROCALTROL) 0.5 MCG capsule Take 0.5 mcg by mouth daily. 04/15/18   [provider]  carvedilol (COREG) 12.5 MG tablet TAKE 1 TABLET BY MOUTH 2 (TWO) TIMES DAILY WITH A MEAL. 04/15/19   Bensimhon, Shaune Pascal, MD  ELIQUIS 5 MG TABS tablet TAKE 1 TABLET BY MOUTH TWICE A DAY 06/25/19   Bensimhon, Shaune Pascal, MD  folic acid (FOLVITE) 1 MG tablet TAKE 1 TABLET BY MOUTH EVERY DAY 08/10/19   Dorena Dew, FNP  gabapentin (NEURONTIN) 100 MG capsule TAKE 1 CAPSULE BY MOUTH AT BEDTIME. 09/03/19   Dorena Dew, FNP  hydrALAZINE (APRESOLINE) 25 MG tablet Take 1 tablet (25 mg total) by mouth 2 (two) times daily. 10/10/17 04/28/19  Bensimhon, Shaune Pascal, MD  hydroxyurea (HYDREA) 500 MG capsule TAKE ONE CAPSULE BY MOUTH EVERY DAY WITH FOOD TO MINIMIZE GI SIDE EFFECTS 02/12/19   [name], MD  isosorbide mononitrate (IMDUR) 30 MG 24 hr tablet Take 1 tablet (30 mg total) by mouth daily. 03/10/19   Bensimhon, Shaune Pascal, MD  Oxycodone HCl 10 MG TABS Take 1 tablet (10 mg total) by mouth every 4 (four) hours as needed. 08/24/19   Dorena Dew, FNP  sevelamer carbonate (RENVELA) 800 MG tablet  04/28/18    [provider]  sildenafil (VIAGRA) 50 MG tablet Take 1 tablet (50 mg total) by mouth daily as needed for erectile dysfunction. Do not take same day as Isosorbide 06/05/19   Dorena Dew, FNP  torsemide (DEMADEX) 100 MG tablet Take 1 tablet (100 mg total) by mouth daily. Pt needs appointment for future refills. Call [phone] to schedule 05/20/19   Bensimhon, Shaune Pascal, MD    Allergies    Patient has no known allergies.  Review of Systems   Review of Systems  Constitutional: Negative for chills and fever.  HENT: Negative for rhinorrhea and sore throat.   Eyes: Negative for photophobia and visual disturbance.  Respiratory: Positive for shortness of breath. Negative for cough and wheezing.   Cardiovascular: Negative for chest pain, palpitations and leg swelling.  Gastrointestinal:  Positive for abdominal distention. Negative for abdominal pain, diarrhea, nausea and vomiting.  Genitourinary: Negative for dysuria and hematuria.  Musculoskeletal: Negative for arthralgias and back pain.  Skin: Negative for rash and wound.  Neurological: Negative for weakness, light-headedness and headaches.  Psychiatric/Behavioral: Negative for agitation.  All other systems reviewed and are negative.   Physical Exam Updated Vital Signs BP 124/82 (BP Location: Right Arm)   Pulse 72   Temp 98.1 F (36.7 C) (Oral)   Resp 16   Ht 5\' 10"  (1.778 m)   Wt 66.7 kg   SpO2 100%   BMI 21.09 kg/m   Physical Exam Vitals and nursing note reviewed.  Constitutional:      General: He is not in acute distress.    Appearance: Normal appearance. He is well-developed and normal weight. He is not ill-appearing.  HENT:     Head: Normocephalic and atraumatic.     Right Ear: External ear normal.     Left Ear: External ear normal.     Nose: Nose normal. No congestion or rhinorrhea.     Mouth/Throat:     Mouth: Mucous membranes are moist.     Pharynx: Oropharynx is clear.  Eyes:     Extraocular  Movements: Extraocular movements intact.     Pupils: Pupils are equal, round, and reactive to light.  Cardiovascular:     Rate and Rhythm: Normal rate and regular rhythm.     Pulses: Normal pulses.     Heart sounds: Normal heart sounds.  Pulmonary:     Effort: Pulmonary effort is normal. No respiratory distress.     Breath sounds: No stridor. Rales (diffuse) present. No wheezing or rhonchi.  Abdominal:     General: There is distension.     Palpations: Abdomen is soft.     Tenderness: There is no abdominal tenderness. There is no right CVA tenderness, left CVA tenderness, guarding or rebound.  Musculoskeletal:        General: Normal range of motion.     Cervical back: Normal range of motion and neck supple.     Right lower leg: No edema.     Left lower leg: No edema.  Skin:    General: Skin is warm and dry.     Capillary Refill: Capillary refill takes less than 2 seconds.  Neurological:     General: No focal deficit present.     Mental Status: He is alert and oriented to person, place, and time. Mental status is at baseline.  Psychiatric:        Mood and Affect: Mood normal.     ED Results / Procedures / Treatments   Labs (all labs ordered are listed, but only abnormal results are displayed) Labs Reviewed  BASIC METABOLIC PANEL - Abnormal; Notable for the following components:      Result Value   Potassium 3.4 (*)    Chloride 95 (*)    Glucose, Bld 107 (*)    Creatinine, Ser 2.99 (*)    Calcium 8.8 (*)    GFR calc non Af Amer 22 (*)    GFR calc Af Amer 25 (*)    All other components within normal limits  CBC - Abnormal; Notable for the following components:   RBC 2.20 (*)    Hemoglobin 9.6 (*)    HCT 25.7 (*)    MCV 116.8 (*)    MCH 43.6 (*)    MCHC 37.4 (*)    Platelets 138 (*)  nRBC 3.4 (*)    All other components within normal limits  PROTIME-INR - Abnormal; Notable for the following components:   Prothrombin Time 17.4 (*)    INR 1.4 (*)    All other  components within normal limits  BRAIN NATRIURETIC PEPTIDE - Abnormal; Notable for the following components:   B Natriuretic Peptide 1,836.7 (*)    All other components within normal limits  SARS CORONAVIRUS 2 (TAT 6-24 HRS)  HIV ANTIBODY (ROUTINE TESTING W REFLEX)  COMPREHENSIVE METABOLIC PANEL  TROPONIN I (HIGH SENSITIVITY)  TROPONIN I (HIGH SENSITIVITY)    EKG None  Radiology DG Chest 2 View  Result Date: 09/11/2019 CLINICAL DATA:  Shortness of breath EXAM: CHEST - 2 VIEW COMPARISON:  June 02, 2018 FINDINGS: The heart size is enlarged. There are prominent interstitial lung markings. Aortic calcifications are noted. There is no pneumothorax. No large pleural effusion. There is no focal infiltrate. No acute osseous abnormality. IMPRESSION: Cardiomegaly with mild interstitial edema. Electronically Signed   By: Constance Holster M.D.   On: 09/11/2019 14:52    Procedures Procedures (including critical care time)  Medications Ordered in ED Medications  furosemide (LASIX) injection 80 mg (has no administration in time range)  allopurinol (ZYLOPRIM) tablet 100 mg (has no administration in time range)  Oxycodone HCl TABS 10 mg (has no administration in time range)  hydroxyurea (HYDREA) capsule 500 mg (has no administration in time range)  carvedilol (COREG) tablet 12.5 mg (has no administration in time range)  hydrALAZINE (APRESOLINE) tablet 25 mg (has no administration in time range)  isosorbide mononitrate (IMDUR) 24 hr tablet 30 mg (has no administration in time range)  calcitRIOL (ROCALTROL) capsule 0.5 mcg (has no administration in time range)  sevelamer carbonate (RENVELA) tablet 800 mg (has no administration in time range)  apixaban (ELIQUIS) tablet 5 mg (has no administration in time range)  folic acid (FOLVITE) tablet 1 mg (has no administration in time range)  gabapentin (NEURONTIN) capsule 100 mg (has no administration in time range)  sodium chloride flush (NS) 0.9 %  injection 3 mL (has no administration in time range)  sodium chloride flush (NS) 0.9 % injection 3 mL (has no administration in time range)  0.9 %  sodium chloride infusion (has no administration in time range)  acetaminophen (TYLENOL) tablet 650 mg (has no administration in time range)  ondansetron (ZOFRAN) injection 4 mg (has no administration in time range)  sodium chloride flush (NS) 0.9 % injection 3 mL (3 mLs Intravenous Given 09/11/19 1949)  furosemide (LASIX) injection 20 mg (20 mg Intravenous Given 09/11/19 2051)  furosemide (LASIX) injection 60 mg (60 mg Intravenous Given 09/11/19 2221)  potassium chloride SA (KLOR-CON) CR tablet 20 mEq (20 mEq Oral Given 09/11/19 2220)    ED Course  I have reviewed the triage vital signs and the nursing notes.  Pertinent labs & imaging results that were available during my care of the patient were reviewed by me and considered in my medical decision making (see chart for details).    MDM Rules/Calculators/A&P                     Patient is a 64 year old male with a PMH of hypertension, CHF, CKD, A. fib and ESRD presenting to the ED today due to concern for hypervolemia. On exam, patient has diffuse crackles on lung auscultation and mildly distended abdomen without tenderness.  Vital signs stable.  Afebrile.  On arrival, patient appears generally well and is displaying  no signs of acute distress.  His breathing is even, unlabored and he has no signs of hypoxia.  Patient reports that he normally carries fluid in his abdomen and feels as though it is distended.  EKG shows atrial fibrillation with right axis deviation.  No significant changes from previous.  Chest x-ray shows cardiomegaly with mild interstitial edema.  CBC with hemoglobin 9.6 which appears improved from previous.  BMP consistent with ESRD.  Serial troponins 13, consecutively.  BNP 1836.  Based off of presentation and lab work, patient appears to be experiencing CHF exacerbation.  Provided  20 mg IV Lasix.  Patient to be admitted for further IV diuresis as his home p.o. medication was not improving his symptoms.  Patient admitted to hospitalist service.  For details on hospital course following admission, please refer to inpatient team's note.  Patient stable at time of admission.  Patient assessed and evaluated with Dr. Tamera Punt.  Nadeen Landau, MD   Final Clinical Impression(s) / ED Diagnoses Final diagnoses:  None    Rx / DC Orders ED Discharge Orders    None       Nadeen Landau, MD 09/12/19 2831    Malvin Johns, MD 09/12/19 [phone]

## 2019-09-11 NOTE — ED Triage Notes (Signed)
Patient c/o fluid build-up in stomach area; stated has history of heart problem and A fib and been feeling short of breath and tired lately.

## 2019-09-12 ENCOUNTER — Inpatient Hospital Stay (HOSPITAL_COMMUNITY): Payer: PPO

## 2019-09-12 DIAGNOSIS — I5021 Acute systolic (congestive) heart failure: Secondary | ICD-10-CM

## 2019-09-12 LAB — GLUCOSE, CAPILLARY
Glucose-Capillary: 85 mg/dL (ref 70–99)
Glucose-Capillary: 140 mg/dL — ABNORMAL HIGH (ref 70–99)
Glucose-Capillary: 98 mg/dL (ref 70–99)
Glucose-Capillary: 80 mg/dL (ref 70–99)

## 2019-09-12 LAB — CBC WITH DIFFERENTIAL/PLATELET
Abs Immature Granulocytes: 0.05 10*3/uL (ref 0.00–0.07)
Basophils Absolute: 0.1 10*3/uL (ref 0.0–0.1)
Basophils Relative: 2 %
Eosinophils Absolute: 0.2 10*3/uL (ref 0.0–0.5)
Eosinophils Relative: 4 %
HCT: 24.4 % — ABNORMAL LOW (ref 39.0–52.0)
Hemoglobin: 9.1 g/dL — ABNORMAL LOW (ref 13.0–17.0)
Immature Granulocytes: 1 %
Lymphocytes Relative: 17 %
Lymphs Abs: 0.8 10*3/uL (ref 0.7–4.0)
MCH: 44 pg — ABNORMAL HIGH (ref 26.0–34.0)
MCHC: 37.3 g/dL — ABNORMAL HIGH (ref 30.0–36.0)
MCV: 117.9 fL — ABNORMAL HIGH (ref 80.0–100.0)
Monocytes Absolute: 1 10*3/uL (ref 0.1–1.0)
Monocytes Relative: 22 %
Neutro Abs: 2.5 10*3/uL (ref 1.7–7.7)
Neutrophils Relative %: 54 %
Platelets: 126 10*3/uL — ABNORMAL LOW (ref 150–400)
RBC: 2.07 MIL/uL — ABNORMAL LOW (ref 4.22–5.81)
RDW: 13.8 % (ref 11.5–15.5)
WBC: 4.5 10*3/uL (ref 4.0–10.5)
nRBC: 3.8 % — ABNORMAL HIGH (ref 0.0–0.2)

## 2019-09-12 LAB — HIV ANTIBODY (ROUTINE TESTING W REFLEX): HIV Screen 4th Generation wRfx: NONREACTIVE

## 2019-09-12 LAB — ECHOCARDIOGRAM COMPLETE
Height: 71 in
Weight: 2380.8 oz

## 2019-09-12 LAB — COMPREHENSIVE METABOLIC PANEL
ALT: 26 U/L (ref 0–44)
AST: 26 U/L (ref 15–41)
Albumin: 3.3 g/dL — ABNORMAL LOW (ref 3.5–5.0)
Alkaline Phosphatase: 81 U/L (ref 38–126)
Anion gap: 13 (ref 5–15)
BUN: 17 mg/dL (ref 8–23)
CO2: 28 mmol/L (ref 22–32)
Calcium: 9.2 mg/dL (ref 8.9–10.3)
Chloride: 96 mmol/L — ABNORMAL LOW (ref 98–111)
Creatinine, Ser: 3.53 mg/dL — ABNORMAL HIGH (ref 0.61–1.24)
GFR calc Af Amer: 20 mL/min — ABNORMAL LOW (ref >60)
GFR calc non Af Amer: 18 mL/min — ABNORMAL LOW (ref >60)
Glucose, Bld: 85 mg/dL (ref 70–99)
Potassium: 3.8 mmol/L (ref 3.5–5.1)
Sodium: 137 mmol/L (ref 135–145)
Total Bilirubin: 2.1 mg/dL — ABNORMAL HIGH (ref 0.3–1.2)
Total Protein: 6.5 g/dL (ref 6.5–8.1)

## 2019-09-12 LAB — SARS CORONAVIRUS 2 (TAT 6-24 HRS): SARS Coronavirus 2: NEGATIVE

## 2019-09-12 MED ORDER — PENTAFLUOROPROP-TETRAFLUOROETH EX AERO: 1.0000 "application " | INHALATION_SPRAY | CUTANEOUS | Status: DC | PRN

## 2019-09-12 MED ORDER — HEPARIN SODIUM (PORCINE) 1000 UNIT/ML DIALYSIS: 1000.0000 [IU] | INTRAMUSCULAR | Status: DC | PRN

## 2019-09-12 MED ORDER — SODIUM CHLORIDE 0.9% FLUSH
3.0000 mL | Freq: Two times a day (BID) | INTRAVENOUS | Status: DC
  Administered 2019-09-12 – 2019-09-13 (×2): 3 mL via INTRAVENOUS

## 2019-09-12 MED ORDER — DOXERCALCIFEROL 4 MCG/2ML IV SOLN: 1.0000 ug | INTRAVENOUS | Status: DC

## 2019-09-12 MED ORDER — LIDOCAINE-PRILOCAINE 2.5-2.5 % EX CREA: 1.0000 "application " | TOPICAL_CREAM | CUTANEOUS | Status: DC | PRN

## 2019-09-12 MED ORDER — SODIUM CHLORIDE 0.9 % IV SOLN: 100.0000 mL | INTRAVENOUS | Status: DC | PRN

## 2019-09-12 MED ORDER — ACETAMINOPHEN 325 MG PO TABS: 650.0000 mg | ORAL_TABLET | ORAL | Status: DC | PRN

## 2019-09-12 MED ORDER — HEPARIN SODIUM (PORCINE) 1000 UNIT/ML DIALYSIS: 20.0000 [IU]/kg | INTRAMUSCULAR | Status: DC | PRN

## 2019-09-12 MED ORDER — ONDANSETRON HCL 4 MG/2ML IJ SOLN: 4.0000 mg | Freq: Four times a day (QID) | INTRAMUSCULAR | Status: DC | PRN

## 2019-09-12 MED ORDER — CHLORHEXIDINE GLUCONATE CLOTH 2 % EX PADS: 6.0000 | MEDICATED_PAD | Freq: Every day | CUTANEOUS | Status: DC

## 2019-09-12 MED ORDER — FOLIC ACID 1 MG PO TABS
1.0000 mg | ORAL_TABLET | Freq: Every day | ORAL | Status: DC
  Administered 2019-09-12 – 2019-09-13 (×2): 1 mg via ORAL
  Filled 2019-09-12 (×2): qty 1

## 2019-09-12 MED ORDER — ALTEPLASE 2 MG IJ SOLR: 2.0000 mg | Freq: Once | INTRAMUSCULAR | Status: DC | PRN

## 2019-09-12 MED ORDER — LIDOCAINE HCL (PF) 1 % IJ SOLN: 5.0000 mL | INTRAMUSCULAR | Status: DC | PRN

## 2019-09-12 MED ORDER — ALLOPURINOL 100 MG PO TABS
100.0000 mg | ORAL_TABLET | Freq: Two times a day (BID) | ORAL | Status: DC
  Administered 2019-09-12 – 2019-09-13 (×3): 100 mg via ORAL
  Filled 2019-09-12 (×3): qty 1

## 2019-09-12 MED ORDER — CARVEDILOL 12.5 MG PO TABS
12.5000 mg | ORAL_TABLET | Freq: Two times a day (BID) | ORAL | Status: DC
  Administered 2019-09-13: 12.5 mg via ORAL
  Filled 2019-09-12: qty 1

## 2019-09-12 MED ORDER — APIXABAN 5 MG PO TABS
5.0000 mg | ORAL_TABLET | Freq: Two times a day (BID) | ORAL | Status: DC
  Administered 2019-09-12 – 2019-09-13 (×3): 5 mg via ORAL
  Filled 2019-09-12 (×3): qty 1

## 2019-09-12 MED ORDER — SEVELAMER CARBONATE 800 MG PO TABS: 800.0000 mg | ORAL_TABLET | Freq: Three times a day (TID) | ORAL | Status: DC

## 2019-09-12 MED ORDER — PRO-STAT SUGAR FREE PO LIQD
30.0000 mL | Freq: Two times a day (BID) | ORAL | Status: DC
  Administered 2019-09-12 – 2019-09-13 (×3): 30 mL via ORAL
  Filled 2019-09-12 (×3): qty 30

## 2019-09-12 MED ORDER — CALCITRIOL 0.5 MCG PO CAPS: 0.5000 ug | ORAL_CAPSULE | ORAL | Status: DC

## 2019-09-12 MED ORDER — FERRIC CITRATE 1 GM 210 MG(FE) PO TABS
420.0000 mg | ORAL_TABLET | Freq: Three times a day (TID) | ORAL | Status: DC
  Administered 2019-09-12 – 2019-09-13 (×4): 420 mg via ORAL
  Filled 2019-09-12 (×6): qty 2

## 2019-09-12 MED ORDER — SODIUM CHLORIDE 0.9 % IV SOLN: 250.0000 mL | INTRAVENOUS | Status: DC | PRN

## 2019-09-12 MED ORDER — ISOSORBIDE MONONITRATE ER 30 MG PO TB24
30.0000 mg | ORAL_TABLET | Freq: Every day | ORAL | Status: DC
  Administered 2019-09-13: 30 mg via ORAL
  Filled 2019-09-12: qty 1

## 2019-09-12 MED ORDER — OXYCODONE HCL 5 MG PO TABS
10.0000 mg | ORAL_TABLET | ORAL | Status: DC | PRN
  Administered 2019-09-12 – 2019-09-13 (×2): 10 mg via ORAL
  Filled 2019-09-12 (×3): qty 2

## 2019-09-12 MED ORDER — HYDROXYUREA 500 MG PO CAPS
500.0000 mg | ORAL_CAPSULE | Freq: Every day | ORAL | Status: DC
  Administered 2019-09-12 – 2019-09-13 (×2): 500 mg via ORAL
  Filled 2019-09-12 (×2): qty 1

## 2019-09-12 MED ORDER — SODIUM CHLORIDE 0.9% FLUSH: 3.0000 mL | INTRAVENOUS | Status: DC | PRN

## 2019-09-12 MED ORDER — HYDRALAZINE HCL 25 MG PO TABS
25.0000 mg | ORAL_TABLET | Freq: Two times a day (BID) | ORAL | Status: DC
  Administered 2019-09-12 – 2019-09-13 (×2): 25 mg via ORAL
  Filled 2019-09-12 (×2): qty 1

## 2019-09-12 MED ORDER — GABAPENTIN 100 MG PO CAPS
100.0000 mg | ORAL_CAPSULE | Freq: Every day | ORAL | Status: DC
  Administered 2019-09-12 (×2): 100 mg via ORAL
  Filled 2019-09-12 (×2): qty 1

## 2019-09-12 NOTE — Progress Notes (Signed)
  Echocardiogram 2D Echocardiogram has been performed.  Patrick [name] 09/12/2019, 8:46 AM

## 2019-09-12 NOTE — Consult Note (Signed)
[name] Renal Consultation Note    Indication for Consultation:  Management of ESRD/hemodialysis; anemia, hypertension/volume and secondary hyperparathyroidism PCP: Dorethea Clan, MD  HPI: Patrick [name] is a 64 y.o. male with ESRD on HD since November 2020 with hx sickle cell, legally blind, atrial fibrillation, hx GIB, isch CM with EF 25-30%  who presents with a several week history of abdominal swelling and SOB.  He had COVID in January by report and dialyzed at [facility] but had 2 neg tests in [place].   He denies cough, CP, fever, chills, N, V, scrotal edema.  He sometimes has LE edema.  He denies being more SOB when supine but uses multiple pills. Appetite is good. He is compliant with dialysis and also works at [employer].Review of outpatient notes indicates EDW had been  66.5 in February but then increased to 68.  At that point he had been leaving below slightly and complaining of SOB.  Instead of being lowered as was the intent, his EDW was raised to 69.5.  He has subsequently on most days leaving below his EDW as much as 68 kg 3/24  except on Friday he had minimal net UF and attained the EDW of 69.4 with net UF 0.1 L CXR in ED mild interstitial edema.  Labs unremarkable for dialysis patient Except T bili 2.1  Past Medical History:  Diagnosis Date  . Blood dyscrasia   . Blood transfusion    "I've had a bunch of them"  . CHF (congestive heart failure) ([place])    followed by hf clinic  . Chronic kidney disease (CKD), stage III (moderate)   . Dialysis patient ([place]) 04/2019   AV fistula (right arm)  . Gout   . HUDJSHFW(263.7)    "probably weekly" (01/13/2014)  . Legally blind   . Shortness of breath dyspnea   . Sickle cell disease, type SS ([place])   . Swelling abdomen   . Swelling of extremity, left   . Swelling of extremity, right    Past Surgical History:  Procedure Laterality Date  . BASCILIC VEIN TRANSPOSITION Right 04/12/2014   Procedure:  BASILIC VEIN TRANSPOSITION;  Surgeon: Elam Dutch, MD;  Location: [place];  Service: Vascular;  Laterality: Right;  . CARDIAC CATHETERIZATION  05/2011  . CARDIOVERSION N/A 06/05/2018   Procedure: CARDIOVERSION;  Surgeon: Jolaine Artist, MD;  Location: [place];  Service: Cardiovascular;  Laterality: N/A;  . COLONOSCOPY WITH PROPOFOL N/A 09/24/2017   Procedure: COLONOSCOPY WITH PROPOFOL;  Surgeon: Jackquline Denmark, MD;  Location: [facility] ENDOSCOPY;  Service: Endoscopy;  Laterality: N/A;  . ESOPHAGOGASTRODUODENOSCOPY N/A 09/19/2017   Procedure: ESOPHAGOGASTRODUODENOSCOPY (EGD);  Surgeon: Jackquline Denmark, MD;  Location: [facility] ENDOSCOPY;  Service: Endoscopy;  Laterality: N/A;  . LEFT AND RIGHT HEART CATHETERIZATION WITH CORONARY ANGIOGRAM N/A 06/11/2011   Procedure: LEFT AND RIGHT HEART CATHETERIZATION WITH CORONARY ANGIOGRAM;  Surgeon: Larey Dresser, MD;  Location: [facility] CATH LAB;  Service: Cardiovascular;  Laterality: N/A;  . TEE WITHOUT CARDIOVERSION N/A 12/27/2015   Procedure: TRANSESOPHAGEAL ECHOCARDIOGRAM (TEE);  Surgeon: Sueanne Margarita, MD;  Location: [facility] ENDOSCOPY;  Service: Cardiovascular;  Laterality: N/A;   Family History  Problem Relation Age of Onset  . Alcohol abuse Mother   . Liver disease Mother   . Cirrhosis Mother   . Heart attack Mother    Social History:  reports that he quit smoking about 3 years ago. His smoking use included cigarettes. He has a 10.25 pack-year smoking history. He  has never used smokeless tobacco. He reports current alcohol use of about 3.0 standard drinks of alcohol per week. He reports that he does not use drugs. No Known Allergies Prior to Admission medications   Medication Sig Start Date End Date Taking? Authorizing Provider  allopurinol (ZYLOPRIM) 100 MG tablet Take 100 mg by mouth 2 (two) times daily.   Yes [provider]  calcitRIOL (ROCALTROL) 0.5 MCG capsule Take 0.5 mcg by mouth See admin instructions. Tues Thurs Sat and Sun 04/15/18  Yes  [provider]  carvedilol (COREG) 12.5 MG tablet TAKE 1 TABLET BY MOUTH 2 (TWO) TIMES DAILY WITH A MEAL. Patient taking differently: Take 12.5 mg by mouth 2 (two) times daily with a meal.  04/15/19  Yes [name], Patrick Pascal, MD  ELIQUIS 5 MG TABS tablet TAKE 1 TABLET BY MOUTH TWICE A DAY Patient taking differently: Take 5 mg by mouth 2 (two) times daily.  06/25/19  Yes [name], Patrick Pascal, MD  folic acid (FOLVITE) 1 MG tablet TAKE 1 TABLET BY MOUTH EVERY DAY 08/10/19  Yes Patrick Dew, FNP  gabapentin (NEURONTIN) 100 MG capsule TAKE 1 CAPSULE BY MOUTH AT BEDTIME. 09/03/19  Yes Patrick Dew, FNP  hydrALAZINE (APRESOLINE) 25 MG tablet Take 1 tablet (25 mg total) by mouth 2 (two) times daily. 10/10/17 09/11/19 Yes [name], Patrick Pascal, MD  hydroxyurea (HYDREA) 500 MG capsule TAKE ONE CAPSULE BY MOUTH EVERY DAY WITH FOOD TO MINIMIZE GI SIDE EFFECTS 02/12/19  Yes [name], Patrick E, MD  isosorbide mononitrate (IMDUR) 30 MG 24 hr tablet Take 1 tablet (30 mg total) by mouth daily. 03/10/19  Yes [name], Patrick Pascal, MD  Oxycodone HCl 10 MG TABS Take 1 tablet (10 mg total) by mouth every 4 (four) hours as needed. Patient taking differently: Take 10 mg by mouth every 4 (four) hours as needed (pain).  08/24/19  Yes Patrick Dew, FNP  sevelamer carbonate (RENVELA) 800 MG tablet Take 800 mg by mouth 3 (three) times daily with meals.  04/28/18  Yes [provider]  torsemide (DEMADEX) 100 MG tablet Take 1 tablet (100 mg total) by mouth daily. Pt needs appointment for future refills. Call [phone] to schedule 05/20/19  Yes [name], Patrick Pascal, MD  sildenafil (VIAGRA) 50 MG tablet Take 1 tablet (50 mg total) by mouth daily as needed for erectile dysfunction. Do not take same day as Isosorbide 06/05/19   Patrick Dew, FNP   Current Facility-Administered Medications  Medication Dose Route Frequency Provider Last Rate Last Admin  . 0.9 %  sodium chloride infusion  250 mL Intravenous PRN  Opyd, Ilene Qua, MD      . acetaminophen (TYLENOL) tablet 650 mg  650 mg Oral Q4H PRN Opyd, Ilene Qua, MD      . allopurinol (ZYLOPRIM) tablet 100 mg  100 mg Oral BID Opyd, Ilene Qua, MD      . apixaban (ELIQUIS) tablet 5 mg  5 mg Oral BID Opyd, Ilene Qua, MD      . carvedilol (COREG) tablet 12.5 mg  12.5 mg Oral BID WC Opyd, Ilene Qua, MD      . Chlorhexidine Gluconate Cloth 2 % PADS 6 each  6 each Topical Q0600 Alric Seton, PA-C      . [START ON 09/14/2019] doxercalciferol (HECTOROL) injection 1 mcg  1 mcg Intravenous Q M,W,F-HD Alric Seton, PA-C      . ferric citrate (AURYXIA) tablet 420 mg  420 mg Oral TID WC Alric Seton, PA-C      .  folic acid (FOLVITE) tablet 1 mg  1 mg Oral Daily Opyd, Ilene Qua, MD      . furosemide (LASIX) injection 80 mg  80 mg Intravenous Q12H Opyd, Ilene Qua, MD      . gabapentin (NEURONTIN) capsule 100 mg  100 mg Oral QHS Opyd, Ilene Qua, MD   100 mg at 09/12/19 0138  . hydrALAZINE (APRESOLINE) tablet 25 mg  25 mg Oral BID Opyd, Ilene Qua, MD      . hydroxyurea (HYDREA) capsule 500 mg  500 mg Oral Q breakfast Opyd, Ilene Qua, MD      . isosorbide mononitrate (IMDUR) 24 hr tablet 30 mg  30 mg Oral Daily Opyd, Ilene Qua, MD      . ondansetron (ZOFRAN) injection 4 mg  4 mg Intravenous Q6H PRN Opyd, Ilene Qua, MD      . oxyCODONE (Oxy IR/ROXICODONE) immediate release tablet 10 mg  10 mg Oral Q4H PRN Opyd, Ilene Qua, MD   10 mg at 09/12/19 0138  . sodium chloride flush (NS) 0.9 % injection 3 mL  3 mL Intravenous Q12H Opyd, Ilene Qua, MD      . sodium chloride flush (NS) 0.9 % injection 3 mL  3 mL Intravenous PRN Vianne Bulls, MD       Labs: Basic Metabolic Panel: Recent Labs  Lab 09/11/19 1424 09/12/19 0426  NA 139 137  K 3.4* 3.8  CL 95* 96*  CO2 32 28  GLUCOSE 107* 85  BUN 12 17  CREATININE 2.99* 3.53*  CALCIUM 8.8* 9.2   Liver Function Tests: Recent Labs  Lab 09/12/19 0426  AST 26  ALT 26  ALKPHOS 81  BILITOT 2.1*  PROT 6.5  ALBUMIN  3.3*   No results for input(s): LIPASE, AMYLASE in the last 168 hours. No results for input(s): AMMONIA in the last 168 hours. CBC: Recent Labs  Lab 09/11/19 1424 09/12/19 0426  WBC 5.0 4.5  NEUTROABS  --  2.5  HGB 9.6* 9.1*  HCT 25.7* 24.4*  MCV 116.8* 117.9*  PLT 138* 126*   Cardiac Enzymes: No results for input(s): CKTOTAL, CKMB, CKMBINDEX, TROPONINI in the last 168 hours. CBG: Recent Labs  Lab 09/12/19 0810  GLUCAP 85   Iron Studies: No results for input(s): IRON, TIBC, TRANSFERRIN, FERRITIN in the last 72 hours. Studies/Results: DG Chest 2 View  Result Date: 09/11/2019 CLINICAL DATA:  Shortness of breath EXAM: CHEST - 2 VIEW COMPARISON:  June 02, 2018 FINDINGS: The heart size is enlarged. There are prominent interstitial lung markings. Aortic calcifications are noted. There is no pneumothorax. No large pleural effusion. There is no focal infiltrate. No acute osseous abnormality. IMPRESSION: Cardiomegaly with mild interstitial edema. Electronically Signed   By: Constance Holster M.D.   On: 09/11/2019 14:52    ROS: As per HPI otherwise negative.  Physical Exam: Vitals:   09/11/19 2300 09/11/19 2330 09/12/19 0023 09/12/19 0453  BP: (!) 133/101 132/75 130/79 135/87  Pulse: (!) 42 90 75 70  Resp: 14 15 19 13   Temp:   97.8 F (36.6 C) 98.5 F (36.9 C)  TempSrc:   Oral Oral  SpO2: 98% 99% 100% 99%  Weight:   67.5 kg 67.5 kg  Height:   5' 11"  (1.803 m)      General: WDWN NAD Head: NCAT sclera not icteric MMM legally blind Neck: Supple. Neck veins full Lungs:  Soft rales. Breathing is unlabored. Heart: RRR with S1 S2.  Abdomen: soft NT + BS  distended NT ,mild ascites Lower extremities:without significnat  edema or ischemic changes, no open wounds  Neuro: A & O  X 3. Moves all extremities spontaneously. Psych:  Responds to questions appropriately with a normal affect. Dialysis Access:right upper AVF + bruit  Dialysis Orders: MWF AF 4 hr EDW 69.5  right upper  AVF 2K 2.25 Ca heparin 2400 400/600 Mircera 200 q 2 - last dose 3/24 venofer 50/week- hectorol 1  Recent labs: hgb 9.2 49% sat iPTH 200s   Assessment/Plan: 1. SOB + ascites/rales - needs EDW lowered - Extra tmt today for volume , Repeat Echo pending 2. ESRD -  MWF - plan extra treatment today to lower volume K 3.8 - lower EDW for d/c and continue to challenge 3. Hypertension/volume  - needs EDW lowered + ascites - hopefully can lower with UF - likely contributing to SOB; on coreg/hydral - discussed with RN holding am meds today due to dialysis. 4. Anemia  - hgb 9.1 stable - consistent with outpatient Hgb - ESA just dosed 5. Metabolic bone disease -  Continue binders/VDRA 6. Nutrition - renal diet abl 3.3 - added prostat 7. Ascites ^ T bili - LFT ok - alk phos normal -  8. Atrial fibrillation - on BB and eliquis 9. Sickle cell  Myriam Jacobson, PA-C [facility] [phone] 09/12/2019, 8:37 AM

## 2019-09-12 NOTE — Progress Notes (Signed)
PROGRESS NOTE    Patrick [name]  JGO:[id number] DOB: Jan 04, 1956 DOA: 09/11/2019 PCP: [name], MD   Brief Narrative: 64 year old male with history of sickle cell anemia, and NICM, A. fib with history of GI bleed on warfarin currently on Eliquis, ESRD and HD presented to the ER with progressive fatigue, abdominal distention, orthopnea needing 10 pillows at home in order to sleep near upright, last hemodialysis on day of admission 3/26, who has been radiating to his fluid and dietary restriction but continues to feel short of breath. In th ED,patient is found to be afebrile, saturating adequately on room air while at rest, and with stable blood pressure.  EKG features atrial fibrillation.  Chest x-ray with cardiomegaly and mild interstitial edema.  Chemistry panel notable for potassium 3.4, normal bicarbonate, and normal BUN.  CBC with stable microcytic anemia and mild thrombocytopenia.  High-sensitivity troponin is normal x2.  BNP elevated to 1831.  Patient was given 20 mg IV Lasix in the ED and hospitalist called to admit.  Subjective: Reports doing about the same and still short of breath time to time Overnight afebrile T-max 98.5, and 10% room air, vitals showed blood pressure 130s stable respiration rate. Blood work with potassium 3.8, stable CBC with chronic anemia 9.1 hemoglobin platelet 1 26,000 uop charted 1350 ml since admission, wt 147->148.8 Reports he weigh at 147 l bat home  Assessment & Plan:  Acute on chronic systolic CHF/nonischemic cardiomyopathy, normally on torsemide 100 mg daily.Last lvef 05/2018-25-30%, severe LAE, mild AI, severe MR, moderate TR and mildly increased PA pressure.  Still symptomatic short of breath with minimal activity.  Continue on IV diuretics, monitoring of intake output Daily weight.  Echocardiogram pending.  Cont home coreg, Imdur and hydralazine. Overall weight is stable normally at 147 pound at home.  Discussed nephrology and is going  for dialysis today.  Sickle cell anemia: Hemoglobin is stable.  Monitor. End stage renal disease :on HD, last one 3/26-going for dialysis again today manages fluid. Atrial fibrillation, likely permanent currently on Eliquis and controlled rate on carvedilol.  Monitor.   Nutrition: Diet Order            Diet renal with fluid restriction Fluid restriction: 1200 mL Fluid; Room service appropriate? Yes; Fluid consistency: Thin  Diet effective now            Body mass index is 20.75 kg/m.   DVT prophylaxis: eliquis Code Status:full Family Communication: plan of care discussed with patient at bedside.  He is self updating his girlfriend. Disposition Plan: Patient is from:home Anticipated Disposition: to home ,in TBD Barriers to discharge or conditions that needs to be met prior to discharge: 64 year old male with ESRD CHF admitted with shortness of breath concern with CHF fluid overload and being managed with diuretics and dialysis, plan on discharge home once stable next 1 to 2 days. Pt eval for dispo   Consultants:see note  Procedures:see note Microbiology:see note   Medications: Scheduled Meds: . allopurinol  100 mg Oral BID  . apixaban  5 mg Oral BID  . carvedilol  12.5 mg Oral BID WC  . Chlorhexidine Gluconate Cloth  6 each Topical Q0600  . [START ON 09/14/2019] doxercalciferol  1 mcg Intravenous Q M,W,F-HD  . feeding supplement (PRO-STAT SUGAR FREE 64)  30 mL Oral BID  . ferric citrate  420 mg Oral TID WC  . folic acid  1 mg Oral Daily  . furosemide  80 mg Intravenous Q12H  . gabapentin  100 mg Oral QHS  . hydrALAZINE  25 mg Oral BID  . hydroxyurea  500 mg Oral Q breakfast  . isosorbide mononitrate  30 mg Oral Daily  . sodium chloride flush  3 mL Intravenous Q12H   Continuous Infusions: . sodium chloride      Antimicrobials: Anti-infectives (From admission, onward)   None       Objective: Vitals: Today's Vitals   09/12/19 0023 09/12/19 0201 09/12/19 0453  09/12/19 0913  BP: 130/79  135/87 (!) 150/89  Pulse: 75  70 81  Resp: 19  13 16   Temp: 97.8 F (36.6 C)  98.5 F (36.9 C) 97.6 F (36.4 C)  TempSrc: Oral  Oral Oral  SpO2: 100%  99% 99%  Weight: 67.5 kg  67.5 kg   Height: 5' 11"  (1.803 m)     PainSc:  0-No pain      Intake/Output Summary (Last 24 hours) at 09/12/2019 0919 Last data filed at 09/12/2019 0811 Gross per 24 hour  Intake 100 ml  Output 1950 ml  Net -1850 ml   Filed Weights   09/11/19 1415 09/12/19 0023 09/12/19 0453  Weight: 66.7 kg 67.5 kg 67.5 kg   Weight change:    Intake/Output from previous day: 03/26 0701 - 03/27 0700 In: 100 [P.O.:100] Out: 1350 [Urine:1350] Intake/Output this shift: Total I/O In: -  Out: 600 [Urine:600]  Examination:  General exam: AAOx3 ,NAD, weak appearing. HEENT:Oral mucosa moist, Ear/Nose WNL grossly,dentition normal. Respiratory system: bilaterally diminished,no wheezing or crackles,no use of accessory muscle, non tender. Cardiovascular system: S1 & S2 +, regular, No JVD. Gastrointestinal system: Abdomen soft, NT,ND, BS+. Nervous System:Alert, awake, moving extremities and grossly nonfocal Extremities: mild edema, distal peripheral pulses palpable.  Skin: No rashes,no icterus. MSK: Normal muscle bulk,tone, power  Data Reviewed: I have personally reviewed following labs and imaging studies CBC: Recent Labs  Lab 09/11/19 1424 09/12/19 0426  WBC 5.0 4.5  NEUTROABS  --  2.5  HGB 9.6* 9.1*  HCT 25.7* 24.4*  MCV 116.8* 117.9*  PLT 138* 381*   Basic Metabolic Panel: Recent Labs  Lab 09/11/19 1424 09/12/19 0426  NA 139 137  K 3.4* 3.8  CL 95* 96*  CO2 32 28  GLUCOSE 107* 85  BUN 12 17  CREATININE 2.99* 3.53*  CALCIUM 8.8* 9.2   GFR: Estimated Creatinine Clearance: 21 mL/min (A) (by C-G formula based on SCr of 3.53 mg/dL (H)). Liver Function Tests: Recent Labs  Lab 09/12/19 0426  AST 26  ALT 26  ALKPHOS 81  BILITOT 2.1*  PROT 6.5  ALBUMIN 3.3*   No  results for input(s): LIPASE, AMYLASE in the last 168 hours. No results for input(s): AMMONIA in the last 168 hours. Coagulation Profile: Recent Labs  Lab 09/11/19 1424  INR 1.4*   Cardiac Enzymes: No results for input(s): CKTOTAL, CKMB, CKMBINDEX, TROPONINI in the last 168 hours. BNP (last 3 results) No results for input(s): PROBNP in the last 8760 hours. HbA1C: No results for input(s): HGBA1C in the last 72 hours. CBG: Recent Labs  Lab 09/12/19 0810  GLUCAP 85   Lipid Profile: No results for input(s): CHOL, HDL, LDLCALC, TRIG, CHOLHDL, LDLDIRECT in the last 72 hours. Thyroid Function Tests: No results for input(s): TSH, T4TOTAL, FREET4, T3FREE, THYROIDAB in the last 72 hours. Anemia Panel: No results for input(s): VITAMINB12, FOLATE, FERRITIN, TIBC, IRON, RETICCTPCT in the last 72 hours. Sepsis Labs: No results for input(s): PROCALCITON, LATICACIDVEN in the last 168 hours.  Recent Results (  from the past 240 hour(s))  SARS CORONAVIRUS 2 (TAT 6-24 HRS) Nasopharyngeal Nasopharyngeal Swab     Status: None   Collection Time: 09/11/19 10:25 PM   Specimen: Nasopharyngeal Swab  Result Value Ref Range Status   SARS Coronavirus 2 NEGATIVE NEGATIVE Final    Comment: (NOTE) SARS-CoV-2 target nucleic acids are NOT DETECTED. The SARS-CoV-2 RNA is generally detectable in upper and lower respiratory specimens during the acute phase of infection. Negative results do not preclude SARS-CoV-2 infection, do not rule out co-infections with other pathogens, and should not be used as the sole basis for treatment or other patient management decisions. Negative results must be combined with clinical observations, patient history, and epidemiological information. The expected result is Negative. Fact Sheet for Patients: SugarRoll.be Fact Sheet for Healthcare Providers: https://www.woods-mathews.com/ This test is not yet approved or cleared by the [place] FDA and  has been authorized for detection and/or diagnosis of SARS-CoV-2 by FDA under an Emergency Use Authorization (EUA). This EUA will remain  in effect (meaning this test can be used) for the duration of the COVID-19 declaration under Section 56 4(b)(1) of the Act, 21 U.S.C. section 360bbb-3(b)(1), unless the authorization is terminated or revoked sooner. Performed at [facility], [place] [address] [place], [place] [address]       Radiology Studies: DG Chest 2 View  Result Date: 09/11/2019 CLINICAL DATA:  Shortness of breath EXAM: CHEST - 2 VIEW COMPARISON:  June 02, 2018 FINDINGS: The heart size is enlarged. There are prominent interstitial lung markings. Aortic calcifications are noted. There is no pneumothorax. No large pleural effusion. There is no focal infiltrate. No acute osseous abnormality. IMPRESSION: Cardiomegaly with mild interstitial edema. Electronically Signed   By: Constance Holster M.D.   On: 09/11/2019 14:52     LOS: 1 day   Time spent: More than 50% of that time was spent in counseling and/or coordination of care.  Antonieta Pert, MD [facility]  09/12/2019, 9:19 AM

## 2019-09-13 DIAGNOSIS — I5023 Acute on chronic systolic (congestive) heart failure: Secondary | ICD-10-CM

## 2019-09-13 LAB — BASIC METABOLIC PANEL
Anion gap: 8 (ref 5–15)
BUN: 17 mg/dL (ref 8–23)
CO2: 29 mmol/L (ref 22–32)
Calcium: 9.3 mg/dL (ref 8.9–10.3)
Chloride: 100 mmol/L (ref 98–111)
Creatinine, Ser: 2.97 mg/dL — ABNORMAL HIGH (ref 0.61–1.24)
GFR calc Af Amer: 25 mL/min — ABNORMAL LOW (ref >60)
GFR calc non Af Amer: 22 mL/min — ABNORMAL LOW (ref >60)
Glucose, Bld: 81 mg/dL (ref 70–99)
Potassium: 3.6 mmol/L (ref 3.5–5.1)
Sodium: 137 mmol/L (ref 135–145)

## 2019-09-13 LAB — CBC
HCT: 26.8 % — ABNORMAL LOW (ref 39.0–52.0)
Hemoglobin: 9.9 g/dL — ABNORMAL LOW (ref 13.0–17.0)
MCH: 43.6 pg — ABNORMAL HIGH (ref 26.0–34.0)
MCHC: 36.9 g/dL — ABNORMAL HIGH (ref 30.0–36.0)
MCV: 118.1 fL — ABNORMAL HIGH (ref 80.0–100.0)
Platelets: 123 10*3/uL — ABNORMAL LOW (ref 150–400)
RBC: 2.27 MIL/uL — ABNORMAL LOW (ref 4.22–5.81)
RDW: 13.7 % (ref 11.5–15.5)
WBC: 5.1 10*3/uL (ref 4.0–10.5)
nRBC: 5.1 % — ABNORMAL HIGH (ref 0.0–0.2)

## 2019-09-13 MED ORDER — SACUBITRIL-VALSARTAN 24-26 MG PO TABS: 1.0000 | ORAL_TABLET | Freq: Two times a day (BID) | ORAL | 0 refills | Status: AC

## 2019-09-13 MED ORDER — SACUBITRIL-VALSARTAN 24-26 MG PO TABS
1.0000 | ORAL_TABLET | Freq: Two times a day (BID) | ORAL | Status: DC
  Administered 2019-09-13: 1 via ORAL
  Filled 2019-09-13: qty 1

## 2019-09-13 NOTE — Discharge Summary (Signed)
Physician Discharge Summary  Patrick [name] BMW:[id number] DOB: 03-31-1956 DOA: 09/11/2019  PCP: [name], MD  Admit date: 09/11/2019 Discharge date: 09/13/2019  Admitted From: home Disposition:  home  Recommendations for Outpatient Follow-up:  Follow up with PCP in 1-2 weeks Please obtain BMP/CBC in one week Please follow up on the following pending results:  Home Health:no  Equipment/Devices: none  Discharge Condition: Stable Code Status: full Diet recommendation: Heart Healthy  Brief/Interim Summary:   Brief Narrative: 64 year old male with history of sickle cell anemia, and NICM, A. fib with history of GI bleed on warfarin currently on Eliquis, ESRD and HD presented to the ER with progressive fatigue, abdominal distention, orthopnea needing 10 pillows at home in order to sleep near upright, last hemodialysis on day of admission 3/26, who has been radiating to his fluid and dietary restriction but continues to feel short of breath. In th ED,patient is found to be afebrile, saturating adequately on room air while at rest, and with stable blood pressure.  EKG features atrial fibrillation.  Chest x-ray with cardiomegaly and mild interstitial edema.  Chemistry panel notable for potassium 3.4, normal bicarbonate, and normal BUN.  CBC with stable microcytic anemia and mild thrombocytopenia.  High-sensitivity troponin is normal x2.  BNP elevated to 1831.  Patient was given 20 mg IV Lasix in the ED and hospitalist called to admit. Patient was seen by nephrology, was diuresed also want additional dialysis, his weight has improved, he clinically feels well, seen by cardiology medication adjusted and is being discharged home and will follow up with outpatient cardiology clinic.   Discharge Diagnoses:  Assessment & Plan:  Acute on chronic systolic CHF/nonischemic cardiomyopathy, normally on torsemide 100 mg daily.Last lvef 05/2018-25-30%, severe LAE, mild AI, severe MR, moderate  TR and mildly increased PA pressure.  Repeat echo showed EF 35 to 40% LV global hypokinesia LV diastolic function could not be evaluated, RV moderately enlarged RVSP 77 with severely elevated pulmonary artery systolic pressure. Wt down to 137 from 147 with HD 3/27 and iv lasix .  Patient feels improved no more shortness of breath.  Seen by cardiology and advised to  "continue coreg 12.5mg  bid. Stop hydral and imdur. Start entresto 24/26mg  bid.  - needs reestablish with HF clinic to consider additional titration" cardiology will make follow-up arrangements. He has been cleared for discharge.  Sickle cell anemia: Hemoglobin is stable.  Monitor. End stage renal disease :on HD, 3/26 and 3/27  Atrial fibrillation, likely permanent currently on Eliquis and controlled rate on carvedilol.  Monitor.  Nutrition: Diet Order             Diet renal with fluid restriction Fluid restriction: 1200 mL Fluid; Room service appropriate? Yes; Fluid consistency: Thin  Diet effective now              Body mass index is 19.18 kg/m.  Disposition Plan: home  Consultants: nephro, cardio  Procedures:  1. Left ventricular ejection fraction, by estimation, is 35 to 40%. The  left ventricle has moderately decreased function. The left ventricle  demonstrates global hypokinesis. Left ventricular diastolic function could  not be evaluated.   2. Right ventricular systolic function is low normal. The right  ventricular size is moderately enlarged. There is severely elevated  pulmonary artery systolic pressure. The estimated right ventricular  systolic pressure is 27.2 mmHg.   3. Left atrial size was severely dilated.   4. Right atrial size was moderately dilated.   5. The mitral valve  is grossly normal. Moderate mitral valve  regurgitation. No evidence of mitral stenosis.   6. Tricuspid valve regurgitation is severe.   7. The aortic valve is grossly normal. Aortic valve regurgitation is not  visualized. Mild  aortic valve sclerosis is present, with no evidence of  aortic valve stenosis    Subjective: Feels well no more shortness of breath.wants to go home Status post hemodialysis 3/27-ultrafiltrate 2800, urine output 1350, total net -5170 ml Reports he weighs at 147 lb at home. wt 147->148.8 HD/LASIX->137.5   Discharge Exam: Vitals:   09/13/19 1057 09/13/19 1239  BP: 125/77 126/81  Pulse:    Resp:    Temp:    SpO2:     General: Pt is alert, awake, not in acute distress Cardiovascular: RRR, S1/S2 +, no rubs, no gallops Respiratory: CTA bilaterally, no wheezing, no rhonchi Abdominal: Soft, NT, ND, bowel sounds + Extremities: no edema, no cyanosis  Discharge Instructions  Discharge Instructions     (HEART FAILURE PATIENTS) Call MD:  Anytime you have any of the following symptoms: 1) 3 pound weight gain in 24 hours or 5 pounds in 1 week 2) shortness of breath, with or without a dry hacking cough 3) swelling in the hands, feet or stomach 4) if you have to sleep on extra pillows at night in order to breathe.   Complete by: As directed    Diet - low sodium heart healthy   Complete by: As directed    Discharge instructions   Complete by: As directed    continue coreg 12.5mg  bid. Stop hydral and imdur. Start entresto 24/26mg  bid.  - needs reestablish with HF clinic to consider additional titration.   Please call call MD or return to ER for similar or worsening recurring problem that brought you to hospital or if any fever,nausea/vomiting,abdominal pain, uncontrolled pain, chest pain,  shortness of breath or any other alarming symptoms.  Please follow-up your doctor as instructed in a week time and call the office for appointment.  Please avoid alcohol, smoking, or any other illicit substance and maintain healthy habits including taking your regular medications as prescribed.  You were cared for by a hospitalist during your hospital stay. If you have any questions about your discharge  medications or the care you received while you were in the hospital after you are discharged, you can call the unit and ask to speak with the hospitalist on call if the hospitalist that took care of you is not available.  Once you are discharged, your primary care physician will handle any further medical issues. Please note that NO REFILLS for any discharge medications will be authorized once you are discharged, as it is imperative that you return to your primary care physician (or establish a relationship with a primary care physician if you do not have one) for your aftercare needs so that they can reassess your need for medications and monitor your lab values   Increase activity slowly   Complete by: As directed       Allergies as of 09/13/2019   No Known Allergies      Medication List     STOP taking these medications    hydrALAZINE 25 MG tablet Commonly known as: APRESOLINE   isosorbide mononitrate 30 MG 24 hr tablet Commonly known as: IMDUR       TAKE these medications    allopurinol 100 MG tablet Commonly known as: ZYLOPRIM Take 100 mg by mouth 2 (two) times daily.   calcitRIOL  0.5 MCG capsule Commonly known as: ROCALTROL Take 0.5 mcg by mouth See admin instructions. Tues Thurs Sat and Sun   carvedilol 12.5 MG tablet Commonly known as: COREG TAKE 1 TABLET BY MOUTH 2 (TWO) TIMES DAILY WITH A MEAL. What changed: See the new instructions.   Eliquis 5 MG Tabs tablet Generic drug: apixaban TAKE 1 TABLET BY MOUTH TWICE A DAY What changed: how much to take   folic acid 1 MG tablet Commonly known as: FOLVITE TAKE 1 TABLET BY MOUTH EVERY DAY   gabapentin 100 MG capsule Commonly known as: NEURONTIN TAKE 1 CAPSULE BY MOUTH AT BEDTIME.   hydroxyurea 500 MG capsule Commonly known as: HYDREA TAKE ONE CAPSULE BY MOUTH EVERY DAY WITH FOOD TO MINIMIZE GI SIDE EFFECTS   Oxycodone HCl 10 MG Tabs Take 1 tablet (10 mg total) by mouth every 4 (four) hours as needed. What  changed: reasons to take this   sacubitril-valsartan 24-26 MG Commonly known as: ENTRESTO Take 1 tablet by mouth 2 (two) times daily.   sevelamer carbonate 800 MG tablet Commonly known as: RENVELA Take 800 mg by mouth 3 (three) times daily with meals.   sildenafil 50 MG tablet Commonly known as: VIAGRA Take 1 tablet (50 mg total) by mouth daily as needed for erectile dysfunction. Do not take same day as Isosorbide   torsemide 100 MG tablet Commonly known as: DEMADEX Take 1 tablet (100 mg total) by mouth daily. Pt needs appointment for future refills. Call [phone] to schedule       Follow-up Information     [name], MD Follow up in 1 week(s).   Specialty: Internal Medicine Contact information: [place] [address] [phone]         Bensimhon, Shaune Pascal, MD Follow up.   Specialty: Cardiology Why: The office will contact you to schedule an outpatient follow-up appointment with Dr. Haroldine Laws or a PA/NP on his team.  Contact information: [place] [address] [phone]           No Known Allergies  The results of significant diagnostics from this hospitalization (including imaging, microbiology, ancillary and laboratory) are listed below for reference.    Microbiology: Recent Results (from the past 240 hour(s))  SARS CORONAVIRUS 2 (TAT 6-24 HRS) Nasopharyngeal Nasopharyngeal Swab     Status: None   Collection Time: 09/11/19 10:25 PM   Specimen: Nasopharyngeal Swab  Result Value Ref Range Status   SARS Coronavirus 2 NEGATIVE NEGATIVE Final    Comment: (NOTE) SARS-CoV-2 target nucleic acids are NOT DETECTED. The SARS-CoV-2 RNA is generally detectable in upper and lower respiratory specimens during the acute phase of infection. Negative results do not preclude SARS-CoV-2 infection, do not rule out co-infections with other pathogens, and should not be used as the sole basis for treatment or  other patient management decisions. Negative results must be combined with clinical observations, patient history, and epidemiological information. The expected result is Negative. Fact Sheet for Patients: SugarRoll.be Fact Sheet for Healthcare Providers: https://www.woods-mathews.com/ This test is not yet approved or cleared by the [place] FDA and  has been authorized for detection and/or diagnosis of SARS-CoV-2 by FDA under an Emergency Use Authorization (EUA). This EUA will remain  in effect (meaning this test can be used) for the duration of the COVID-19 declaration under Section 56 4(b)(1) of the Act, 21 U.S.C. section 360bbb-3(b)(1), unless the authorization is terminated or revoked sooner. Performed at [facility], [place] [address], [place],  [address]     Procedures/Studies: DG Chest 2 View  Result Date: 09/11/2019 CLINICAL DATA:  Shortness of breath EXAM: CHEST - 2 VIEW COMPARISON:  June 02, 2018 FINDINGS: The heart size is enlarged. There are prominent interstitial lung markings. Aortic calcifications are noted. There is no pneumothorax. No large pleural effusion. There is no focal infiltrate. No acute osseous abnormality. IMPRESSION: Cardiomegaly with mild interstitial edema. Electronically Signed   By: Constance Holster M.D.   On: 09/11/2019 14:52   ECHOCARDIOGRAM COMPLETE  Result Date: 09/12/2019    ECHOCARDIOGRAM REPORT   Patient Name:   TRESHUN WOLD Date of Exam: 09/12/2019 Medical Rec #:  [id number]           Height:       71.0 in Accession #:    [id number]          Weight:       148.8 lb Date of Birth:  09-01-1955           BSA:          1.860 m Patient Age:    80 years            BP:           135/87 mmHg Patient Gender: M                   HR:           87 bpm. Exam Location:  Inpatient Procedure: 2D Echo Indications:    acute systolic chf 151.76  History:        Patient has prior history of  Echocardiogram examinations, most                 recent 06/03/2018. CHF, end stage renal disease. sickle cell                 anemia., Arrythmias:Atrial Fibrillation; Risk                 Factors:Hypertension.  Sonographer:    Johny Chess Referring Phys: [id number] [place]  1. Left ventricular ejection fraction, by estimation, is 35 to 40%. The left ventricle has moderately decreased function. The left ventricle demonstrates global hypokinesis. Left ventricular diastolic function could not be evaluated.  2. Right ventricular systolic function is low normal. The right ventricular size is moderately enlarged. There is severely elevated pulmonary artery systolic pressure. The estimated right ventricular systolic pressure is 06.2 mmHg.  3. Left atrial size was severely dilated.  4. Right atrial size was moderately dilated.  5. The mitral valve is grossly normal. Moderate mitral valve regurgitation. No evidence of mitral stenosis.  6. Tricuspid valve regurgitation is severe.  7. The aortic valve is grossly normal. Aortic valve regurgitation is not visualized. Mild aortic valve sclerosis is present, with no evidence of aortic valve stenosis. FINDINGS  Left Ventricle: Left ventricular ejection fraction, by estimation, is 35 to 40%. The left ventricle has moderately decreased function. The left ventricle demonstrates global hypokinesis. The left ventricular internal cavity size was normal in size. There is no left ventricular hypertrophy. Left ventricular diastolic function could not be evaluated due to atrial fibrillation. Left ventricular diastolic function could not be evaluated. Right Ventricle: The right ventricular size is moderately enlarged. Right vetricular wall thickness was not assessed. Right ventricular systolic function is low normal. There is severely elevated pulmonary artery systolic pressure. The tricuspid regurgitant velocity is 3.94 m/s,  and with an assumed right atrial pressure of  15 mmHg, the estimated right ventricular systolic pressure is 93.2 mmHg. Left Atrium: Left atrial size was severely dilated. Right Atrium: Right atrial size was moderately dilated. Pericardium: There is no evidence of pericardial effusion. Mitral Valve: The mitral valve is grossly normal. Moderate mitral valve regurgitation. No evidence of mitral valve stenosis. Tricuspid Valve: The tricuspid valve is normal in structure. Tricuspid valve regurgitation is severe. No evidence of tricuspid stenosis. Aortic Valve: The aortic valve is grossly normal. Aortic valve regurgitation is not visualized. Mild aortic valve sclerosis is present, with no evidence of aortic valve stenosis. Pulmonic Valve: The pulmonic valve was normal in structure. Pulmonic valve regurgitation is not visualized. No evidence of pulmonic stenosis. Aorta: The aortic root and ascending aorta are structurally normal, with no evidence of dilitation. IAS/Shunts: The atrial septum is grossly normal.  LEFT VENTRICLE PLAX 2D LVIDd:         6.50 cm LVIDs:         5.20 cm LV PW:         1.30 cm LV IVS:        1.20 cm LVOT diam:     2.30 cm LVOT Area:     4.15 cm  RIGHT VENTRICLE TAPSE (M-mode): 1.8 cm LEFT ATRIUM              Index       RIGHT ATRIUM           Index LA diam:        5.90 cm  3.17 cm/m  RA Area:     26.50 cm LA Vol (A2C):   144.0 ml 77.44 ml/m RA Volume:   89.10 ml  47.92 ml/m LA Vol (A4C):   171.0 ml 91.96 ml/m LA Biplane Vol: 159.0 ml 85.51 ml/m   AORTA Ao Root diam: 3.50 cm Ao Asc diam:  3.60 cm TRICUSPID VALVE TR Peak grad:   62.1 mmHg TR Vmax:        394.00 cm/s  SHUNTS Systemic Diam: 2.30 cm Mertie Moores MD Electronically signed by Mertie Moores MD Signature Date/Time: 09/12/2019/1:38:35 PM    Final      Labs: BNP (last 3 results) Recent Labs    03/24/19 0841 09/11/19 1946  BNP 1,953.6* 6,712.4*   Basic Metabolic Panel: Recent Labs  Lab 09/11/19 1424 09/12/19 0426 09/13/19 0522  NA 139 137 137  K 3.4* 3.8 3.6  CL  95* 96* 100  CO2 32 28 29  GLUCOSE 107* 85 81  BUN 12 17 17   CREATININE 2.99* 3.53* 2.97*  CALCIUM 8.8* 9.2 9.3   Liver Function Tests: Recent Labs  Lab 09/12/19 0426  AST 26  ALT 26  ALKPHOS 81  BILITOT 2.1*  PROT 6.5  ALBUMIN 3.3*   No results for input(s): LIPASE, AMYLASE in the last 168 hours. No results for input(s): AMMONIA in the last 168 hours. CBC: Recent Labs  Lab 09/11/19 1424 09/12/19 0426 09/13/19 0522  WBC 5.0 4.5 5.1  NEUTROABS  --  2.5  --   HGB 9.6* 9.1* 9.9*  HCT 25.7* 24.4* 26.8*  MCV 116.8* 117.9* 118.1*  PLT 138* 126* 123*   Cardiac Enzymes: No results for input(s): CKTOTAL, CKMB, CKMBINDEX, TROPONINI in the last 168 hours. BNP: Invalid input(s): POCBNP CBG: Recent Labs  Lab 09/12/19 0810 09/12/19 1126 09/12/19 1626 09/12/19 2202  GLUCAP 85 140* 98 80   D-Dimer No results for input(s): DDIMER in the last 72  hours. Hgb A1c No results for input(s): HGBA1C in the last 72 hours. Lipid Profile No results for input(s): CHOL, HDL, LDLCALC, TRIG, CHOLHDL, LDLDIRECT in the last 72 hours. Thyroid function studies No results for input(s): TSH, T4TOTAL, T3FREE, THYROIDAB in the last 72 hours.  Invalid input(s): FREET3 Anemia work up No results for input(s): VITAMINB12, FOLATE, FERRITIN, TIBC, IRON, RETICCTPCT in the last 72 hours. Urinalysis    Component Value Date/Time   COLORURINE STRAW (A) 06/02/2018 1900   APPEARANCEUR CLEAR 06/02/2018 1900   APPEARANCEUR Clear 05/31/2017 0902   LABSPEC 1.006 06/02/2018 1900   PHURINE 7.0 06/02/2018 1900   GLUCOSEU NEGATIVE 06/02/2018 1900   HGBUR SMALL (A) 06/02/2018 1900   BILIRUBINUR negative 09/08/2018 1621   BILIRUBINUR neg 06/19/2018 1525   BILIRUBINUR Negative 05/31/2017 0902   KETONESUR negative 09/08/2018 1621   KETONESUR NEGATIVE 06/02/2018 1900   PROTEINUR Positive (A) 06/19/2018 1525   PROTEINUR 30 (A) 06/02/2018 1900   UROBILINOGEN 0.2 09/08/2018 1621   UROBILINOGEN 0.2 09/14/2013  1354   NITRITE Negative 09/08/2018 1621   NITRITE neg 06/19/2018 1525   NITRITE NEGATIVE 06/02/2018 1900   LEUKOCYTESUR Negative 09/08/2018 1621   LEUKOCYTESUR Negative 05/31/2017 0902   Sepsis Labs Invalid input(s): PROCALCITONIN,  WBC,  LACTICIDVEN Microbiology Recent Results (from the past 240 hour(s))  SARS CORONAVIRUS 2 (TAT 6-24 HRS) Nasopharyngeal Nasopharyngeal Swab     Status: None   Collection Time: 09/11/19 10:25 PM   Specimen: Nasopharyngeal Swab  Result Value Ref Range Status   SARS Coronavirus 2 NEGATIVE NEGATIVE Final    Comment: (NOTE) SARS-CoV-2 target nucleic acids are NOT DETECTED. The SARS-CoV-2 RNA is generally detectable in upper and lower respiratory specimens during the acute phase of infection. Negative results do not preclude SARS-CoV-2 infection, do not rule out co-infections with other pathogens, and should not be used as the sole basis for treatment or other patient management decisions. Negative results must be combined with clinical observations, patient history, and epidemiological information. The expected result is Negative. Fact Sheet for Patients: SugarRoll.be Fact Sheet for Healthcare Providers: https://www.woods-mathews.com/ This test is not yet approved or cleared by the [place] FDA and  has been authorized for detection and/or diagnosis of SARS-CoV-2 by FDA under an Emergency Use Authorization (EUA). This EUA will remain  in effect (meaning this test can be used) for the duration of the COVID-19 declaration under Section 56 4(b)(1) of the Act, 21 U.S.C. section 360bbb-3(b)(1), unless the authorization is terminated or revoked sooner. Performed at [facility], [place] [address] [place], [place] [address]      Time coordinating discharge: 25  minutes  SIGNED: Antonieta Pert, MD  [facility] 09/13/2019, 2:23 PM  If 7PM-7AM, please contact night-coverage [email or web address]

## 2019-09-13 NOTE — TOC Transition Note (Signed)
Transition of Care [facility]) - CM/SW Discharge Note   Patient Details  Name: Patrick [name] MRN: [id number] Date of Birth: 09/28/1955  [facility] CM/SW Contact:  Claudie Leach, RN 09/13/2019, 1:12 PM   Clinical Narrative:    Patient to dc home.  CHF screen requested.  Patient states he is able to use [employer] transportation to get to appointments/pharmacy as needed.  He has transportation home.  He does not use or need DME and follows low sodium diet.  Patient does not feel he needs more help or resources at home.    Patient given 30 day free Entresto card.  He is aware to inquire about copay today and request help from [facility] Or MD office if copay is unmanageable.     Final next level of care: Home/Self Care

## 2019-09-13 NOTE — Progress Notes (Addendum)
[facility] Progress Note   Dialysis Orders: MWF AF 4 hr EDW 69.5  right upper AVF 2K 2.25 Ca heparin 2400 400/600 Mircera 200 q 2 - last dose 3/24 venofer 50/week- hectorol 1  Recent labs: hgb 9.2 49% sat iPTH 200s   Assessment/Plan: 1. SOB - improved - needs EDW lowered - Extra tmt yesterday for volume. 2. ESRD -  MWF - had extra treatment Saturday - net UF 2.8 L post wt 65; standing wt this am 62.4 - wts not congruent - will lower edw at d/c to 65 given prior edw - and reassess at outpatient HD and continue to challenge and lower edw 3. Hypertension/volume  -on coreg/hydral -no BP drop on HD yesterday - EDW as above; continue to challenge - Echo showed EF 35-40%, ^ pul artery systolic pressures, mod MR, severe TR 4. Anemia  - hgb 9.9 stable - consistent with outpatient Hgb - ESA just dosed 5. Metabolic bone disease -  Continue binders/VDRA 6. Nutrition - renal diet abl 3.3 - added prostat 7. Ascites ^ T bili - LFT ok - alk phos normal -  8. Atrial fibrillation - on BB and eliquis 9. Sickle cell 10. Disp - ok per renal for d/c today  Myriam Jacobson, PA-C [place] [phone] 09/13/2019,8:20 AM  LOS: 2 days   Subjective:   Breathing better.  Can I go home?   Objective Vitals:   09/12/19 2100 09/12/19 2115 09/12/19 2157 09/13/19 0622  BP: 139/87 138/77 (!) 145/83 135/81  Pulse: 77 78 77 73  Resp:  16 10 18   Temp:  98.1 F (36.7 C) 98.2 F (36.8 C) 98.3 F (36.8 C)  TempSrc:  Oral Oral Oral  SpO2:  100% 100% 99%  Weight:  65 kg  62.4 kg  Height:       Physical Exam General: NAD breathing easily eating breakfast - on room air Heart: RRR with PVCs Lungs: no rales Abdomen: less distended soft Extremities: no L edema Dialysis Access:  Right upper AVF + bruit   Additional Objective Labs: Basic Metabolic Panel: Recent Labs  Lab 09/11/19 1424 09/12/19 0426 09/13/19 0522  NA 139 137 137  K 3.4* 3.8 3.6  CL 95* 96* 100  CO2  32 28 29  GLUCOSE 107* 85 81  BUN 12 17 17   CREATININE 2.99* 3.53* 2.97*  CALCIUM 8.8* 9.2 9.3   Liver Function Tests: Recent Labs  Lab 09/12/19 0426  AST 26  ALT 26  ALKPHOS 81  BILITOT 2.1*  PROT 6.5  ALBUMIN 3.3*   No results for input(s): LIPASE, AMYLASE in the last 168 hours. CBC: Recent Labs  Lab 09/11/19 1424 09/12/19 0426 09/13/19 0522  WBC 5.0 4.5 5.1  NEUTROABS  --  2.5  --   HGB 9.6* 9.1* 9.9*  HCT 25.7* 24.4* 26.8*  MCV 116.8* 117.9* 118.1*  PLT 138* 126* 123*   Blood Culture    Component Value Date/Time   SDES URINE, RANDOM 06/02/2018 1709   SPECREQUEST NONE 06/02/2018 1709   CULT  06/02/2018 1709    NO GROWTH Performed at [facility], [place] [address], [place], [place] [address]    REPTSTATUS 06/04/2018 FINAL 06/02/2018 1709    Cardiac Enzymes: No results for input(s): CKTOTAL, CKMB, CKMBINDEX, TROPONINI in the last 168 hours. CBG: Recent Labs  Lab 09/12/19 0810 09/12/19 1126 09/12/19 1626 09/12/19 2202  GLUCAP 85 140* 98 80   Iron Studies: No results for input(s): IRON,  TIBC, TRANSFERRIN, FERRITIN in the last 72 hours. Lab Results  Component Value Date   INR 1.4 (H) 09/11/2019   INR 1.50 09/19/2017   INR 3.86 09/18/2017   Studies/Results: DG Chest 2 View  Result Date: 09/11/2019 CLINICAL DATA:  Shortness of breath EXAM: CHEST - 2 VIEW COMPARISON:  June 02, 2018 FINDINGS: The heart size is enlarged. There are prominent interstitial lung markings. Aortic calcifications are noted. There is no pneumothorax. No large pleural effusion. There is no focal infiltrate. No acute osseous abnormality. IMPRESSION: Cardiomegaly with mild interstitial edema. Electronically Signed   By: Constance Holster M.D.   On: 09/11/2019 14:52   ECHOCARDIOGRAM COMPLETE  Result Date: 09/12/2019    ECHOCARDIOGRAM REPORT   Patient Name:   PRAKASH KIMBERLING Date of Exam: 09/12/2019 Medical Rec #:  [id number]           Height:       71.0 in Accession #:     [id number]          Weight:       148.8 lb Date of Birth:  [birth year]           BSA:          1.860 m Patient Age:    64 years            BP:           135/87 mmHg Patient Gender: M                   HR:           87 bpm. Exam Location:  Inpatient Procedure: 2D Echo Indications:    acute systolic chf 527.78  History:        Patient has prior history of Echocardiogram examinations, most                 recent 06/03/2018. CHF, end stage renal disease. sickle cell                 anemia., Arrythmias:Atrial Fibrillation; Risk                 Factors:Hypertension.  Sonographer:    Johny Chess Referring Phys: [id number] [place]  1. Left ventricular ejection fraction, by estimation, is 35 to 40%. The left ventricle has moderately decreased function. The left ventricle demonstrates global hypokinesis. Left ventricular diastolic function could not be evaluated.  2. Right ventricular systolic function is low normal. The right ventricular size is moderately enlarged. There is severely elevated pulmonary artery systolic pressure. The estimated right ventricular systolic pressure is 14.4 mmHg.  3. Left atrial size was severely dilated.  4. Right atrial size was moderately dilated.  5. The mitral valve is grossly normal. Moderate mitral valve regurgitation. No evidence of mitral stenosis.  6. Tricuspid valve regurgitation is severe.  7. The aortic valve is grossly normal. Aortic valve regurgitation is not visualized. Mild aortic valve sclerosis is present, with no evidence of aortic valve stenosis. FINDINGS  Left Ventricle: Left ventricular ejection fraction, by estimation, is 35 to 40%. The left ventricle has moderately decreased function. The left ventricle demonstrates global hypokinesis. The left ventricular internal cavity size was normal in size. There is no left ventricular hypertrophy. Left ventricular diastolic function could not be evaluated due to atrial fibrillation. Left ventricular diastolic  function could not be evaluated. Right Ventricle: The right ventricular size is moderately enlarged. Right vetricular wall thickness was not assessed. Right ventricular  systolic function is low normal. There is severely elevated pulmonary artery systolic pressure. The tricuspid regurgitant velocity is 3.94 m/s, and with an assumed right atrial pressure of 15 mmHg, the estimated right ventricular systolic pressure is 25.2 mmHg. Left Atrium: Left atrial size was severely dilated. Right Atrium: Right atrial size was moderately dilated. Pericardium: There is no evidence of pericardial effusion. Mitral Valve: The mitral valve is grossly normal. Moderate mitral valve regurgitation. No evidence of mitral valve stenosis. Tricuspid Valve: The tricuspid valve is normal in structure. Tricuspid valve regurgitation is severe. No evidence of tricuspid stenosis. Aortic Valve: The aortic valve is grossly normal. Aortic valve regurgitation is not visualized. Mild aortic valve sclerosis is present, with no evidence of aortic valve stenosis. Pulmonic Valve: The pulmonic valve was normal in structure. Pulmonic valve regurgitation is not visualized. No evidence of pulmonic stenosis. Aorta: The aortic root and ascending aorta are structurally normal, with no evidence of dilitation. IAS/Shunts: The atrial septum is grossly normal.  LEFT VENTRICLE PLAX 2D LVIDd:         6.50 cm LVIDs:         5.20 cm LV PW:         1.30 cm LV IVS:        1.20 cm LVOT diam:     2.30 cm LVOT Area:     4.15 cm  RIGHT VENTRICLE TAPSE (M-mode): 1.8 cm LEFT ATRIUM              Index       RIGHT ATRIUM           Index LA diam:        5.90 cm  3.17 cm/m  RA Area:     26.50 cm LA Vol (A2C):   144.0 ml 77.44 ml/m RA Volume:   89.10 ml  47.92 ml/m LA Vol (A4C):   171.0 ml 91.96 ml/m LA Biplane Vol: 159.0 ml 85.51 ml/m   AORTA Ao Root diam: 3.50 cm Ao Asc diam:  3.60 cm TRICUSPID VALVE TR Peak grad:   62.1 mmHg TR Vmax:        394.00 cm/s  SHUNTS Systemic Diam:  2.30 cm Mertie Moores MD Electronically signed by Mertie Moores MD Signature Date/Time: 09/12/2019/1:38:35 PM    Final    Medications: . sodium chloride     . allopurinol  100 mg Oral BID  . apixaban  5 mg Oral BID  . carvedilol  12.5 mg Oral BID WC  . Chlorhexidine Gluconate Cloth  6 each Topical Q0600  . [START ON 09/14/2019] doxercalciferol  1 mcg Intravenous Q M,W,F-HD  . feeding supplement (PRO-STAT SUGAR FREE 64)  30 mL Oral BID  . ferric citrate  420 mg Oral TID WC  . folic acid  1 mg Oral Daily  . furosemide  80 mg Intravenous Q12H  . gabapentin  100 mg Oral QHS  . hydrALAZINE  25 mg Oral BID  . hydroxyurea  500 mg Oral Q breakfast  . isosorbide mononitrate  30 mg Oral Daily  . sodium chloride flush  3 mL Intravenous Q12H

## 2019-09-13 NOTE — Discharge Instructions (Signed)
Eating Plan for Dialysis Dialysis is a treatment that cleans your blood. It is used when your kidneys are damaged. When you need dialysis, you should watch what you eat. This is because some nutrients can build up in your blood between treatments and make you sick. Your doctor or diet specialist (dietitian) will:  Tell you what nutrients you should include or avoid.  Tell you how much of these nutrients you should get each day.  Help you plan meals.  Tell you how much to drink each day. What are tips for following this plan? Reading food labels  Check food labels for: ? Potassium. This is found in milk, fruits, and vegetables. ? Phosphorus. This is found in milk, cheese, beans, nuts, and carbonated beverages. ? Salt (sodium). This is in processed meats, cured meats, ready-made frozen meals, canned vegetables, and salty snack foods.  Try to find foods that are low in potassium, phosphorus, and sodium.  Look for foods that are labeled "sodium free," "reduced sodium," or "low sodium." Shopping  Do not buy whole-grain and high-fiber foods.  Do not buy or use salt substitutes.  Do not buy processed foods. Cooking  Drain all fluid from cooked vegetables and canned fruits before you eat them.  Before you cook potatoes, cut them into small pieces. Then boil them in unsalted water.  Try using herbs and spices that do not contain sodium to add flavor. Meal planning Most people on dialysis should try to eat:  6-11 servings of grains each day. One serving is equal to 1 slice of bread or  cup of cooked rice or pasta.  2-3 servings of low-potassium vegetables each day. One serving is equal to  cup.  2-3 servings of low-potassium fruits each day. One serving is equal to  cup.  Protein, such as meat, poultry, fish, and eggs. Talk with your doctor or dietitian about the right amount and type of protein to eat.   cup of dairy each day. General information  Follow your doctor's  instructions about how much to drink. You may be told to: ? Write down what you drink. ? Write down the foods you eat that are made mostly from water, such as gelatin and soups. ? Drink from small cups.  Take vitamin and mineral supplements only as told by your doctor.  Take over-the-counter and prescription medicines only as told by your doctor. What foods can I eat?     Fruits Apples. Fresh or frozen berries. Fresh or canned pears, peaches, and pineapple. Grapes. Plums. Vegetables Fresh or frozen broccoli, carrots, and green beans. Cabbage. Cauliflower. Celery. Cucumbers. Eggplant. Radishes. Zucchini. Grains White bread. White rice. Cooked cereal. Unsalted popcorn. Tortillas. Pasta. Meats and other proteins Fresh or frozen beef, pork, chicken, and fish. Eggs. Dairy Cream cheese. Heavy cream. Ricotta cheese. Beverages Apple cider. Cranberry juice. Grape juice. Lemonade. Black coffee. Rice milk (that is not enriched or fortified). Seasonings and condiments Herbs. Spices. Jam and jelly. Honey. Sweets and desserts Sherbet. Cakes. Cookies. Fats and oils Olive oil, canola oil, and safflower oil. Other foods Non-dairy creamer. Non-dairy whipped topping. Homemade broth without salt. The items listed above may not be a complete list of foods and beverages you can eat. Contact your dietitian for more options. What foods should I avoid? Fruits Star fruit. Bananas. Oranges. Kiwi. Nectarines. Prunes. Melon. Dried fruit. Avocado. Vegetables Potatoes. Beets. Tomatoes. Winter squash and pumpkin. Asparagus. Spinach. Parsnips. Grains Whole-grain bread. Whole-grain pasta. High-fiber cereal. Meats and other proteins Canned, smoked, and   cured meats. Packaged lunch meat. Sardines. Nuts and seeds. Peanut butter. Beans and legumes. Dairy Milk. Buttermilk. Yogurt. Cheese and cottage cheese. Processed cheese spreads. Beverages Orange juice. Prune juice. Carbonated soft drinks. Seasonings and  condiments Salt. Salt substitutes. Soy sauce. Sweets and desserts Ice cream. Chocolate. Candied nuts. Fats and oils Butter. Margarine. Other foods Ready-made frozen meals. Canned soups. The items listed above may not be a complete list of foods and beverages you should avoid. Contact your dietitian for more information. Summary  If you are having dialysis, it is important to watch what you eat. Certain nutrients and wastes can build up in your blood and cause you to get sick.  Your dietitian will help you make an eating plan that meets your needs.  Avoid foods that are high in potassium, salt (sodium), and phosphorus. Restrict fluids as told by your doctor or dietitian. This information is not intended to replace advice given to you by your health care provider. Make sure you discuss any questions you have with your health care provider. Document Revised: 08/21/2017 Document Reviewed: 06/05/2017 Elsevier Patient Education  [place].   Heart Failure, Self Care Heart failure is a serious condition. This sheet explains things you need to do to take care of yourself at home. To help you stay as healthy as possible, you may be asked to change your diet, take certain medicines, and make other changes in your life. Your doctor may also give you more specific instructions. If you have problems or questions, call your doctor. What are the risks? Having heart failure makes it more likely for you to have some problems. These problems can get worse if you do not take good care of yourself. Problems may include:  Blood clotting problems. This may cause a stroke.  Damage to the kidneys, liver, or lungs.  Abnormal heart rhythms. Supplies needed:  Scale for weighing yourself.  Blood pressure monitor.  Notebook.  Medicines. How to care for yourself when you have heart failure Medicines Take over-the-counter and prescription medicines only as told by your doctor. Take your medicines  every day.  Do not stop taking your medicine unless your doctor tells you to do so.  Do not skip any medicines.  Get your prescriptions refilled before you run out of medicine. This is important. Eating and drinking   Eat heart-healthy foods. Talk with a diet specialist (dietitian) to create an eating plan.  Choose foods that: ? Have no trans fat. ? Are low in saturated fat and cholesterol.  Choose healthy foods, such as: ? Fresh or frozen fruits and vegetables. ? Fish. ? Low-fat (lean) meats. ? Legumes, such as beans, peas, and lentils. ? Fat-free or low-fat dairy products. ? Whole-grain foods. ? High-fiber foods.  Limit salt (sodium) if told by your doctor. Ask your diet specialist to tell you which seasonings are healthy for your heart.  Cook in healthy ways instead of frying. Healthy ways of cooking include roasting, grilling, broiling, baking, poaching, steaming, and stir-frying.  Limit how much fluid you drink, if told by your doctor. Alcohol use  Do not drink alcohol if: ? Your doctor tells you not to drink. ? Your heart was damaged by alcohol, or you have very bad heart failure. ? You are pregnant, may be pregnant, or are planning to become pregnant.  If you drink alcohol: ? Limit how much you use to:  0-1 drink a day for women.  0-2 drinks a day for men. ? Be aware  of how much alcohol is in your drink. In the [place] one drink equals one 12 oz bottle of beer (355 mL), one 5 oz glass of wine (148 mL), or one 1 oz glass of hard liquor (44 mL). Lifestyle   Do not use any products that contain nicotine or tobacco, such as cigarettes, e-cigarettes, and chewing tobacco. If you need help quitting, ask your doctor. ? Do not use nicotine gum or patches before talking to your doctor.  Do not use illegal drugs.  Lose weight if told by your doctor.  Do physical activity if told by your doctor. Talk to your doctor before you begin an exercise if: ? You are an older  adult. ? You have very bad heart failure.  Learn to manage stress. If you need help, ask your doctor.  Get rehab (rehabilitation) to help you stay independent and to help with your quality of life.  Plan time to rest when you get tired. Check weight and blood pressure   Weigh yourself every day. This will help you to know if fluid is building up in your body. ? Weigh yourself every morning after you pee (urinate) and before you eat breakfast. ? Wear the same amount of clothing each time. ? Write down your daily weight. Give your record to your doctor.  Check and write down your blood pressure as told by your doctor.  Check your pulse as told by your doctor. Dealing with very hot and very cold weather  If it is very hot: ? Avoid activities that take a lot of energy. ? Use air conditioning or fans, or find a cooler place. ? Avoid caffeine and alcohol. ? Wear clothing that is loose-fitting, lightweight, and light-colored.  If it is very cold: ? Avoid activities that take a lot of energy. ? Layer your clothes. ? Wear mittens or gloves, a hat, and a scarf when you go outside. ? Avoid alcohol. Follow these instructions at home:  Stay up to date with shots (vaccines). Get pneumococcal and flu (influenza) shots.  Keep all follow-up visits as told by your doctor. This is important. Contact a doctor if:  You gain weight quickly.  You have increasing shortness of breath.  You cannot do your normal activities.  You get tired easily.  You cough a lot.  You don't feel like eating or feel like you may vomit (nauseous).  You become puffy (swell) in your hands, feet, ankles, or belly (abdomen).  You cannot sleep well because it is hard to breathe.  You feel like your heart is beating fast (palpitations).  You get dizzy when you stand up. Get help right away if:  You have trouble breathing.  You or someone else notices a change in your behavior, such as having trouble  staying awake.  You have chest pain or discomfort.  You pass out (faint). These symptoms may be an emergency. Do not wait to see if the symptoms will go away. Get medical help right away. Call your local emergency services (911 in the [place] Do not drive yourself to the hospital. Summary  Heart failure is a serious condition. To care for yourself, you may have to change your diet, take medicines, and make other lifestyle changes.  Take your medicines every day. Do not stop taking them unless your doctor tells you to do so.  Eat heart-healthy foods, such as fresh or frozen fruits and vegetables, fish, lean meats, legumes, fat-free or low-fat dairy products, and whole-grain or high-fiber  foods.  Ask your doctor if you can drink alcohol. You may have to stop alcohol use if you have very bad heart failure.  Contact your doctor if you gain weight quickly or feel that your heart is beating too fast. Get help right away if you pass out, or have chest pain or trouble breathing. This information is not intended to replace advice given to you by your health care provider. Make sure you discuss any questions you have with your health care provider. Document Revised: 09/16/2018 Document Reviewed: 09/17/2018 Elsevier Patient Education  [place].

## 2019-09-13 NOTE — Consult Note (Signed)
Cardiology Consultation:   Patient ID: Patrick [name] MRN: [id number]; DOB: 1955-11-28  Admit date: 09/11/2019 Date of Consult: 09/13/2019  Primary Care Provider: [name], MD Primary Cardiologist: CHF clinic Primary Electrophysiologist:  None    Patient Profile:   Patrick [name] is a 64 y.o. male with a hx of chronic systolic HF who is being seen today for the evaluation of SOB at the request of Dr .Antonieta Pert  History of Present Illness:   [name] 64 yo male history of sickle cell anemia, chronic systolic HF, ESRD, legally blind, PAF, prior upper GI bleed. Followed in CHF clinic with last visit 07/2018. Presents with worsening SOB, orthopnea. Has been compliant with HD sessions.    K 3.4 Cr 2.99 BUN 12 WBC 5 Hgb 9.6 Plt 138 BNP 1836  Trop 13-->13 EKG rate controlled afib, no acute ischemic changes COVID neg CXR cardiomegaly, mild edema 08/2019 echo LVEF 35-40%, low normal RV function,severe pulm HTN. Severe LAE, mod RAE, mod MR, severe TR. RV TAPSE 1.6, RV s' 10  05/2018 nuclear stress Small region of moderate ischemia in the distal lateral and apical wall.    Past Medical History:  Diagnosis Date  . Blood dyscrasia   . Blood transfusion    "I've had a bunch of them"  . CHF (congestive heart failure) ([place])    followed by hf clinic  . Chronic kidney disease (CKD), stage III (moderate)   . Dialysis patient ([place]) 04/2019   AV fistula (right arm)  . Gout   . URKYHCWC(376.2)    "probably weekly" (01/13/2014)  . Legally blind   . Shortness of breath dyspnea   . Sickle cell disease, type SS ([place])   . Swelling abdomen   . Swelling of extremity, left   . Swelling of extremity, right     Past Surgical History:  Procedure Laterality Date  . BASCILIC VEIN TRANSPOSITION Right 04/12/2014   Procedure: BASILIC VEIN TRANSPOSITION;  Surgeon: Elam Dutch, MD;  Location: [facility];  Service: Vascular;  Laterality: Right;  . CARDIAC CATHETERIZATION  05/2011   . CARDIOVERSION N/A 06/05/2018   Procedure: CARDIOVERSION;  Surgeon: Jolaine Artist, MD;  Location: [facility];  Service: Cardiovascular;  Laterality: N/A;  . COLONOSCOPY WITH PROPOFOL N/A 09/24/2017   Procedure: COLONOSCOPY WITH PROPOFOL;  Surgeon: Jackquline Denmark, MD;  Location: [facility] ENDOSCOPY;  Service: Endoscopy;  Laterality: N/A;  . ESOPHAGOGASTRODUODENOSCOPY N/A 09/19/2017   Procedure: ESOPHAGOGASTRODUODENOSCOPY (EGD);  Surgeon: Jackquline Denmark, MD;  Location[facility] ENDOSCOPY;  Service: Endoscopy;  Laterality: N/A;  . LEFT AND RIGHT HEART CATHETERIZATION WITH CORONARY ANGIOGRAM N/A 06/11/2011   Procedure: LEFT AND RIGHT HEART CATHETERIZATION WITH CORONARY ANGIOGRAM;  Surgeon: Larey Dresser, MD;  Location: [facility] CATH LAB;  Service: Cardiovascular;  Laterality: N/A;  . TEE WITHOUT CARDIOVERSION N/A 12/27/2015   Procedure: TRANSESOPHAGEAL ECHOCARDIOGRAM (TEE);  Surgeon: Sueanne Margarita, MD;  Location: [facility] ENDOSCOPY;  Service: Cardiovascular;  Laterality: N/A;     Inpatient Medications: Scheduled Meds: . allopurinol  100 mg Oral BID  . apixaban  5 mg Oral BID  . carvedilol  12.5 mg Oral BID WC  . Chlorhexidine Gluconate Cloth  6 each Topical Q0600  . [START ON 09/14/2019] doxercalciferol  1 mcg Intravenous Q M,W,F-HD  . feeding supplement (PRO-STAT SUGAR FREE 64)  30 mL Oral BID  . ferric citrate  420 mg Oral TID WC  . folic acid  1 mg Oral Daily  . furosemide  80 mg Intravenous Q12H  .  gabapentin  100 mg Oral QHS  . hydrALAZINE  25 mg Oral BID  . hydroxyurea  500 mg Oral Q breakfast  . isosorbide mononitrate  30 mg Oral Daily  . sodium chloride flush  3 mL Intravenous Q12H   Continuous Infusions: . sodium chloride     PRN Meds: sodium chloride, acetaminophen, ondansetron (ZOFRAN) IV, oxyCODONE, sodium chloride flush  Allergies:   No Known Allergies  Social History:   Social History   Socioeconomic History  . Marital status: Divorced    Spouse name: Not on file  . Number of  children: Not on file  . Years of education: Not on file  . Highest education level: Not on file  Occupational History  . Not on file  Tobacco Use  . Smoking status: Former Smoker    Packs/day: 0.25    Years: 41.00    Pack years: 10.25    Types: Cigarettes    Quit date: 12/08/2015    Years since quitting: 3.7  . Smokeless tobacco: Never Used  Substance and Sexual Activity  . Alcohol use: Yes    Alcohol/week: 3.0 standard drinks    Types: 1 Glasses of wine, 2 Cans of beer per week    Comment: occasional  . Drug use: No  . Sexual activity: Not Currently  Other Topics Concern  . Not on file  Social History Narrative  . Not on file   Social Determinants of Health   Financial Resource Strain:   . Difficulty of Paying Living Expenses:   Food Insecurity:   . Worried About Charity fundraiser in the Last Year:   . Arboriculturist in the Last Year:   Transportation Needs:   . Film/video editor (Medical):   Marland Kitchen Lack of Transportation (Non-Medical):   Physical Activity:   . Days of Exercise per Week:   . Minutes of Exercise per Session:   Stress:   . Feeling of Stress :   Social Connections:   . Frequency of Communication with Friends and Family:   . Frequency of Social Gatherings with Friends and Family:   . Attends Religious Services:   . Active Member of Clubs or Organizations:   . Attends Archivist Meetings:   Marland Kitchen Marital Status:   Intimate Partner Violence:   . Fear of Current or Ex-Partner:   . Emotionally Abused:   Marland Kitchen Physically Abused:   . Sexually Abused:     Family History:    Family History  Problem Relation Age of Onset  . Alcohol abuse Mother   . Liver disease Mother   . Cirrhosis Mother   . Heart attack Mother      ROS:  Please see the history of present illness.  All other ROS reviewed and negative.     Physical Exam/Data:   Vitals:   09/12/19 2157 09/13/19 0622 09/13/19 0924 09/13/19 1057  BP: (!) 145/83 135/81 134/88 125/77    Pulse: 77 73 82   Resp: 10 18    Temp: 98.2 F (36.8 C) 98.3 F (36.8 C)    TempSrc: Oral Oral    SpO2: 100% 99%    Weight:  62.4 kg    Height:        Intake/Output Summary (Last 24 hours) at 09/13/2019 1117 Last data filed at 09/13/2019 0938 Gross per 24 hour  Intake 410 ml  Output 3300 ml  Net -2890 ml   Last 3 Weights 09/13/2019 09/12/2019 09/12/2019  Weight (lbs)  137 lb 8 oz 143 lb 4.8 oz 150 lb 5.7 oz  Weight (kg) 62.37 kg 65 kg 68.2 kg     Body mass index is 19.18 kg/m.  General:  Well nourished, well developed, in no acute distress HEENT: normal Lymph: no adenopathy Neck: no JVD Endocrine:  No thryomegaly Vascular: No carotid bruits; FA pulses 2+ bilaterally without bruits  Cardiac:  normal S1, S2; RRR. 3/6 systolic murmur apex Lungs:  clear to auscultation bilaterally, no wheezing, rhonchi or rales  Abd: soft, nontender, no hepatomegaly  Ext: no edema Musculoskeletal:  No deformities, BUE and BLE strength normal and equal Skin: warm and dry  Neuro:  CNs 2-12 intact, no focal abnormalities noted Psych:  Normal affect     Laboratory Data:  High Sensitivity Troponin:   Recent Labs  Lab 09/11/19 1424 09/11/19 1940  TROPONINIHS 13 13     Chemistry Recent Labs  Lab 09/11/19 1424 09/12/19 0426 09/13/19 0522  NA 139 137 137  K 3.4* 3.8 3.6  CL 95* 96* 100  CO2 32 28 29  GLUCOSE 107* 85 81  BUN 12 17 17   CREATININE 2.99* 3.53* 2.97*  CALCIUM 8.8* 9.2 9.3  GFRNONAA 22* 18* 22*  GFRAA 25* 20* 25*  ANIONGAP 12 13 8     Recent Labs  Lab 09/12/19 0426  PROT 6.5  ALBUMIN 3.3*  AST 26  ALT 26  ALKPHOS 81  BILITOT 2.1*   Hematology Recent Labs  Lab 09/11/19 1424 09/12/19 0426 09/13/19 0522  WBC 5.0 4.5 5.1  RBC 2.20* 2.07* 2.27*  HGB 9.6* 9.1* 9.9*  HCT 25.7* 24.4* 26.8*  MCV 116.8* 117.9* 118.1*  MCH 43.6* 44.0* 43.6*  MCHC 37.4* 37.3* 36.9*  RDW 14.1 13.8 13.7  PLT 138* 126* 123*   BNP Recent Labs  Lab 09/11/19 1946  BNP 1,836.7*     DDimer No results for input(s): DDIMER in the last 168 hours.   Radiology/Studies:  DG Chest 2 View  Result Date: 09/11/2019 CLINICAL DATA:  Shortness of breath EXAM: CHEST - 2 VIEW COMPARISON:  June 02, 2018 FINDINGS: The heart size is enlarged. There are prominent interstitial lung markings. Aortic calcifications are noted. There is no pneumothorax. No large pleural effusion. There is no focal infiltrate. No acute osseous abnormality. IMPRESSION: Cardiomegaly with mild interstitial edema. Electronically Signed   By: Constance Holster M.D.   On: 09/11/2019 14:52   ECHOCARDIOGRAM COMPLETE  Result Date: 09/12/2019    ECHOCARDIOGRAM REPORT   Patient Name:   BILL YOHN Date of Exam: 09/12/2019 Medical Rec #:  [id number]           Height:       71.0 in Accession #:    [id number]          Weight:       148.8 lb Date of Birth:  [birth year]           BSA:          1.860 m Patient Age:    73 years            BP:           135/87 mmHg Patient Gender: M                   HR:           87 bpm. Exam Location:  Inpatient Procedure: 2D Echo Indications:    acute systolic chf 300.76  History:  Patient has prior history of Echocardiogram examinations, most                 recent 06/03/2018. CHF, end stage renal disease. sickle cell                 anemia., Arrythmias:Atrial Fibrillation; Risk                 Factors:Hypertension.  Sonographer:    Johny Chess Referring Phys: [id number] [place]  1. Left ventricular ejection fraction, by estimation, is 35 to 40%. The left ventricle has moderately decreased function. The left ventricle demonstrates global hypokinesis. Left ventricular diastolic function could not be evaluated.  2. Right ventricular systolic function is low normal. The right ventricular size is moderately enlarged. There is severely elevated pulmonary artery systolic pressure. The estimated right ventricular systolic pressure is 24.5 mmHg.  3. Left atrial size was  severely dilated.  4. Right atrial size was moderately dilated.  5. The mitral valve is grossly normal. Moderate mitral valve regurgitation. No evidence of mitral stenosis.  6. Tricuspid valve regurgitation is severe.  7. The aortic valve is grossly normal. Aortic valve regurgitation is not visualized. Mild aortic valve sclerosis is present, with no evidence of aortic valve stenosis. FINDINGS  Left Ventricle: Left ventricular ejection fraction, by estimation, is 35 to 40%. The left ventricle has moderately decreased function. The left ventricle demonstrates global hypokinesis. The left ventricular internal cavity size was normal in size. There is no left ventricular hypertrophy. Left ventricular diastolic function could not be evaluated due to atrial fibrillation. Left ventricular diastolic function could not be evaluated. Right Ventricle: The right ventricular size is moderately enlarged. Right vetricular wall thickness was not assessed. Right ventricular systolic function is low normal. There is severely elevated pulmonary artery systolic pressure. The tricuspid regurgitant velocity is 3.94 m/s, and with an assumed right atrial pressure of 15 mmHg, the estimated right ventricular systolic pressure is 80.9 mmHg. Left Atrium: Left atrial size was severely dilated. Right Atrium: Right atrial size was moderately dilated. Pericardium: There is no evidence of pericardial effusion. Mitral Valve: The mitral valve is grossly normal. Moderate mitral valve regurgitation. No evidence of mitral valve stenosis. Tricuspid Valve: The tricuspid valve is normal in structure. Tricuspid valve regurgitation is severe. No evidence of tricuspid stenosis. Aortic Valve: The aortic valve is grossly normal. Aortic valve regurgitation is not visualized. Mild aortic valve sclerosis is present, with no evidence of aortic valve stenosis. Pulmonic Valve: The pulmonic valve was normal in structure. Pulmonic valve regurgitation is not visualized.  No evidence of pulmonic stenosis. Aorta: The aortic root and ascending aorta are structurally normal, with no evidence of dilitation. IAS/Shunts: The atrial septum is grossly normal.  LEFT VENTRICLE PLAX 2D LVIDd:         6.50 cm LVIDs:         5.20 cm LV PW:         1.30 cm LV IVS:        1.20 cm LVOT diam:     2.30 cm LVOT Area:     4.15 cm  RIGHT VENTRICLE TAPSE (M-mode): 1.8 cm LEFT ATRIUM              Index       RIGHT ATRIUM           Index LA diam:        5.90 cm  3.17 cm/m  RA Area:     26.50 cm  LA Vol (A2C):   144.0 ml 77.44 ml/m RA Volume:   89.10 ml  47.92 ml/m LA Vol (A4C):   171.0 ml 91.96 ml/m LA Biplane Vol: 159.0 ml 85.51 ml/m   AORTA Ao Root diam: 3.50 cm Ao Asc diam:  3.60 cm TRICUSPID VALVE TR Peak grad:   62.1 mmHg TR Vmax:        394.00 cm/s  SHUNTS Systemic Diam: 2.30 cm Mertie Moores MD Electronically signed by Mertie Moores MD Signature Date/Time: 09/12/2019/1:38:35 PM    Final    {   Assessment and Plan:   1. Acute on chronic systolic HF  - history of NICM, cath 2012 without significant disease.  - fluid management per neprhology with HD. Working to lower his estimated dry weight - from notes no issues with bp's on HD - he feels much better with exrta HD session  - we defer fluid managemen tby HD and diuretics to nephrology - since on HD now we can make adjustments in her HF regimen - continue coreg 12.5mg  bid. Stop hydral and imdur. Start entresto 24/26mg  bid.  - needs reestablish with HF clinic to consider additional titration.    2. ESRD - per neprhology  3. Pulmonary HTN - elevated PASP by echo. Clear signs of left sided heart disease contributing, also with history of sickle cell disease. RV enlarged but objective functional parameters are normal range - continue management of left sided heart disease, sickle cell - defer any considartion for further workup to his primary CHF clinic.    [place] for discharge from cardiac standpoing.    For questions or  updates, please contact [place] Please consult [email or web address] for contact info under     Signed, Carlyle Dolly, MD  09/13/2019 11:17 AM

## 2019-09-14 DIAGNOSIS — I5023 Acute on chronic systolic (congestive) heart failure: Secondary | ICD-10-CM | POA: Diagnosis not present

## 2019-09-14 DIAGNOSIS — I4891 Unspecified atrial fibrillation: Secondary | ICD-10-CM | POA: Diagnosis not present

## 2019-09-14 DIAGNOSIS — I130 Hypertensive heart and chronic kidney disease with heart failure and stage 1 through stage 4 chronic kidney disease, or unspecified chronic kidney disease: Secondary | ICD-10-CM | POA: Diagnosis not present

## 2019-09-14 DIAGNOSIS — N186 End stage renal disease: Secondary | ICD-10-CM | POA: Diagnosis not present

## 2019-09-14 DIAGNOSIS — Z992 Dependence on renal dialysis: Secondary | ICD-10-CM | POA: Diagnosis not present

## 2019-09-15 ENCOUNTER — Telehealth: Payer: Self-pay | Admitting: Family Medicine

## 2019-09-15 NOTE — Telephone Encounter (Signed)
Refill request for oxycodone and viagra. Please advise.

## 2019-09-16 ENCOUNTER — Other Ambulatory Visit: Payer: Self-pay | Admitting: Internal Medicine

## 2019-09-16 DIAGNOSIS — I158 Other secondary hypertension: Secondary | ICD-10-CM | POA: Diagnosis not present

## 2019-09-16 DIAGNOSIS — N186 End stage renal disease: Secondary | ICD-10-CM | POA: Diagnosis not present

## 2019-09-16 DIAGNOSIS — Z992 Dependence on renal dialysis: Secondary | ICD-10-CM | POA: Diagnosis not present

## 2019-09-16 MED ORDER — SILDENAFIL CITRATE 50 MG PO TABS: 50.0000 mg | ORAL_TABLET | Freq: Every day | ORAL | 0 refills | Status: DC | PRN

## 2019-09-17 ENCOUNTER — Other Ambulatory Visit: Payer: Self-pay | Admitting: Family Medicine

## 2019-09-17 DIAGNOSIS — M79601 Pain in right arm: Secondary | ICD-10-CM

## 2019-09-17 MED ORDER — OXYCODONE HCL 10 MG PO TABS: 10.0000 mg | ORAL_TABLET | ORAL | 0 refills | Status: DC | PRN

## 2019-09-17 MED ORDER — SILDENAFIL CITRATE 50 MG PO TABS: 50.0000 mg | ORAL_TABLET | Freq: Every day | ORAL | 0 refills | Status: DC | PRN

## 2019-09-17 NOTE — Progress Notes (Signed)
Reviewed PDMP substance reporting system prior to prescribing opiate medications. No inconsistencies noted.   Meds ordered this encounter  Medications  . Oxycodone HCl 10 MG TABS    Sig: Take 1 tablet (10 mg total) by mouth every 4 (four) hours as needed.    Dispense:  60 tablet    Refill:  0    Order Specific Question:   Supervising Provider    Answer:   [name] [id number]  . sildenafil (VIAGRA) 50 MG tablet    Sig: Take 1 tablet (50 mg total) by mouth daily as needed for erectile dysfunction. Do not take same day as Isosorbide    Dispense:  10 tablet    Refill:  0    Order Specific Question:   Supervising Provider    Answer:   [name] [[id number]]     [name]  APRN, MSN, FNP-C [facility] [address] [place], [place] [address] [phone]

## 2019-09-18 DIAGNOSIS — E876 Hypokalemia: Secondary | ICD-10-CM | POA: Diagnosis not present

## 2019-09-18 DIAGNOSIS — N186 End stage renal disease: Secondary | ICD-10-CM | POA: Diagnosis not present

## 2019-09-18 DIAGNOSIS — D689 Coagulation defect, unspecified: Secondary | ICD-10-CM | POA: Diagnosis not present

## 2019-09-18 DIAGNOSIS — D509 Iron deficiency anemia, unspecified: Secondary | ICD-10-CM | POA: Diagnosis not present

## 2019-09-18 DIAGNOSIS — D631 Anemia in chronic kidney disease: Secondary | ICD-10-CM | POA: Diagnosis not present

## 2019-09-18 DIAGNOSIS — Z992 Dependence on renal dialysis: Secondary | ICD-10-CM | POA: Diagnosis not present

## 2019-09-18 DIAGNOSIS — N2581 Secondary hyperparathyroidism of renal origin: Secondary | ICD-10-CM | POA: Diagnosis not present

## 2019-09-29 ENCOUNTER — Other Ambulatory Visit: Payer: Self-pay

## 2019-09-29 ENCOUNTER — Ambulatory Visit (INDEPENDENT_AMBULATORY_CARE_PROVIDER_SITE_OTHER): Payer: PPO | Admitting: Family Medicine

## 2019-09-29 ENCOUNTER — Encounter: Payer: Self-pay | Admitting: Family Medicine

## 2019-09-29 VITALS — BP 104/62 | HR 83 | Temp 98.4°F | Resp 14 | Ht 71.0 in | Wt 147.0 lb

## 2019-09-29 DIAGNOSIS — D571 Sickle-cell disease without crisis: Secondary | ICD-10-CM

## 2019-09-29 DIAGNOSIS — N186 End stage renal disease: Secondary | ICD-10-CM

## 2019-09-29 DIAGNOSIS — I5032 Chronic diastolic (congestive) heart failure: Secondary | ICD-10-CM

## 2019-09-29 NOTE — Progress Notes (Signed)
Established Patient Office Visit  Subjective:  Patient ID: Patrick [name], male    DOB: 1955/08/10  Age: 64 y.o. MRN: [id number]  CC:  Chief Complaint  Patient presents with  . Hospitalization Follow-up    chf --sees cardio on 10/01/2019     HPI Patrick [name] is a 64 year old male with a medical history significant for sickle cell disease, atrial fibrillation with a history of GI bleed on warfarin currently on Eliquis, end-stage renal disease with dialysis 3 days/week presents for a post hospital follow-up.  Patient states that he has been doing well since hospital discharge.  He was admitted to inpatient services with worsening fluid retention.  Prior to admission patient had orthopnea requiring 2-3 pillows at home and having to sleep upright for comfort.  On arrival to ER chest x-ray showed cardiomegaly with mild interstitial edema.  Patient's BNP was elevated to 1831 and patient was given furosemide IV for this problem.  Patient was evaluated by nephrology while inpatient and received additional dialysis.  Weight improved during inpatient admission.  Patient is scheduled to see cardiology on 10/01/2019.  He continues to complain of chronic low back pain related to sickle cell disease.  Patient says that he has had frequent chronic pain exacerbations over the past several months.  Pain has been moderately controlled on oxycodone 10 mg.  He states that some days are worse than others.  Mr. Couts says that he has been unable to get comfortable and has not slept very well because of his back pain.  Current pain intensity is 5/10.  Patient last had oxycodone around 9 PM last night without sustained relief.  Patient continues folic acid and hydroxyurea.  He denies headache, dizziness, chest pain, shortness of breath, nausea, vomiting, or diarrhea.  Past Medical History:  Diagnosis Date  . Blood dyscrasia   . Blood transfusion    "I've had a bunch of them"  . CHF (congestive heart  failure) ([place])    followed by hf clinic  . Chronic kidney disease (CKD), stage III (moderate)   . Dialysis patient ([place]) 04/2019   AV fistula (right arm)  . Gout   . ZSWFUXNA(355.7)    "probably weekly" (01/13/2014)  . Legally blind   . Shortness of breath dyspnea   . Sickle cell disease, type SS ([place])   . Swelling abdomen   . Swelling of extremity, left   . Swelling of extremity, right     Past Surgical History:  Procedure Laterality Date  . BASCILIC VEIN TRANSPOSITION Right 04/12/2014   Procedure: BASILIC VEIN TRANSPOSITION;  Surgeon: Elam Dutch, MD;  Location: [facility];  Service: Vascular;  Laterality: Right;  . CARDIAC CATHETERIZATION  05/2011  . CARDIOVERSION N/A 06/05/2018   Procedure: CARDIOVERSION;  Surgeon: Jolaine Artist, MD;  Location: [facility];  Service: Cardiovascular;  Laterality: N/A;  . COLONOSCOPY WITH PROPOFOL N/A 09/24/2017   Procedure: COLONOSCOPY WITH PROPOFOL;  Surgeon: Jackquline Denmark, MD;  Location[facility] ENDOSCOPY;  Service: Endoscopy;  Laterality: N/A;  . ESOPHAGOGASTRODUODENOSCOPY N/A 09/19/2017   Procedure: ESOPHAGOGASTRODUODENOSCOPY (EGD);  Surgeon: Jackquline Denmark, MD;  Location: [facility] ENDOSCOPY;  Service: Endoscopy;  Laterality: N/A;  . LEFT AND RIGHT HEART CATHETERIZATION WITH CORONARY ANGIOGRAM N/A 06/11/2011   Procedure: LEFT AND RIGHT HEART CATHETERIZATION WITH CORONARY ANGIOGRAM;  Surgeon: Larey Dresser, MD;  Location: [facility] C[facility] LAB;  Service: Cardiovascular;  Laterality: N/A;  . TEE WITHOUT CARDIOVERSION N/A 12/27/2015   Procedure: TRANSESOPHAGEAL ECHOCARDIOGRAM (TEE);  Surgeon: Eber Hong  Radford Pax, MD;  Location: [facility] ENDOSCOPY;  Service: Cardiovascular;  Laterality: N/A;    Family History  Problem Relation Age of Onset  . Alcohol abuse Mother   . Liver disease Mother   . Cirrhosis Mother   . Heart attack Mother     Social History   Socioeconomic History  . Marital status: Divorced    Spouse name: Not on file  . Number of children: Not on file   . Years of education: Not on file  . Highest education level: Not on file  Occupational History  . Not on file  Tobacco Use  . Smoking status: Former Smoker    Packs/day: 0.25    Years: 41.00    Pack years: 10.25    Types: Cigarettes    Quit date: 12/08/2015    Years since quitting: 3.8  . Smokeless tobacco: Never Used  Substance and Sexual Activity  . Alcohol use: Yes    Alcohol/week: 3.0 standard drinks    Types: 1 Glasses of wine, 2 Cans of beer per week    Comment: occasional  . Drug use: No  . Sexual activity: Not Currently  Other Topics Concern  . Not on file  Social History Narrative  . Not on file   Social Determinants of Health   Financial Resource Strain:   . Difficulty of Paying Living Expenses:   Food Insecurity:   . Worried About Charity fundraiser in the Last Year:   . Arboriculturist in the Last Year:   Transportation Needs:   . Film/video editor (Medical):   Marland Kitchen Lack of Transportation (Non-Medical):   Physical Activity:   . Days of Exercise per Week:   . Minutes of Exercise per Session:   Stress:   . Feeling of Stress :   Social Connections:   . Frequency of Communication with Friends and Family:   . Frequency of Social Gatherings with Friends and Family:   . Attends Religious Services:   . Active Member of Clubs or Organizations:   . Attends Archivist Meetings:   Marland Kitchen Marital Status:   Intimate Partner Violence:   . Fear of Current or Ex-Partner:   . Emotionally Abused:   Marland Kitchen Physically Abused:   . Sexually Abused:     Outpatient Medications Prior to Visit  Medication Sig Dispense Refill  . acetaminophen (TYLENOL) 325 MG tablet Take 650 mg by mouth every 6 (six) hours as needed.    Marland Kitchen allopurinol (ZYLOPRIM) 100 MG tablet Take 100 mg by mouth 2 (two) times daily.    . carvedilol (COREG) 12.5 MG tablet TAKE 1 TABLET BY MOUTH 2 (TWO) TIMES DAILY WITH A MEAL. (Patient taking differently: Take 12.5 mg by mouth 2 (two) times daily with a  meal. ) 180 tablet 2  . ELIQUIS 5 MG TABS tablet TAKE 1 TABLET BY MOUTH TWICE A DAY (Patient taking differently: Take 5 mg by mouth 2 (two) times daily. ) 60 tablet 5  . ferric citrate (AURYXIA) 1 GM 210 MG(Fe) tablet Take 420 mg by mouth 3 (three) times daily with meals.    . folic acid (FOLVITE) 1 MG tablet TAKE 1 TABLET BY MOUTH EVERY DAY 90 tablet 3  . gabapentin (NEURONTIN) 100 MG capsule TAKE 1 CAPSULE BY MOUTH AT BEDTIME. 30 capsule 3  . hydroxyurea (HYDREA) 500 MG capsule TAKE ONE CAPSULE BY MOUTH EVERY DAY WITH FOOD TO MINIMIZE GI SIDE EFFECTS 90 capsule 3  . Oxycodone HCl 10  MG TABS Take 1 tablet (10 mg total) by mouth every 4 (four) hours as needed. 60 tablet 0  . sacubitril-valsartan (ENTRESTO) 24-26 MG Take 1 tablet by mouth 2 (two) times daily. 60 tablet 0  . sevelamer carbonate (RENVELA) 800 MG tablet Take 800 mg by mouth 3 (three) times daily with meals.     . sildenafil (VIAGRA) 50 MG tablet Take 1 tablet (50 mg total) by mouth daily as needed for erectile dysfunction. Do not take same day as Isosorbide 10 tablet 0  . torsemide (DEMADEX) 100 MG tablet Take 1 tablet (100 mg total) by mouth daily. Pt needs appointment for future refills. Call [phone] to schedule 30 tablet 1  . calcitRIOL (ROCALTROL) 0.5 MCG capsule Take 0.5 mcg by mouth See admin instructions. Tues Thurs Sat and Sun  3   No facility-administered medications prior to visit.    No Known Allergies  ROS Review of Systems  Constitutional: Negative.   HENT: Negative.   Eyes: Negative.   Respiratory: Negative.   Cardiovascular: Negative.   Gastrointestinal: Negative.   Endocrine: Negative.   Genitourinary: Negative.   Musculoskeletal: Positive for back pain.  Skin: Negative.   Psychiatric/Behavioral: Negative.       Objective:    Physical Exam  Constitutional: He is oriented to person, place, and time. He appears well-developed.  HENT:  Head: Normocephalic.  Eyes: Pupils are equal, round, and  reactive to light.  Cardiovascular: Normal rate and regular rhythm.  Pulmonary/Chest: Effort normal and breath sounds normal.  Abdominal: Soft. There is no guarding.  Musculoskeletal:        General: Normal range of motion.  Neurological: He is alert and oriented to person, place, and time.  Skin: Skin is warm.  Psychiatric: He has a normal mood and affect. His behavior is normal. Judgment and thought content normal.    BP 104/62 (BP Location: Right Arm, Patient Position: Sitting, Cuff Size: Normal)   Pulse 83   Temp 98.4 F (36.9 C) (Oral)   Resp 14   Ht 5\' 11"  (1.803 m)   Wt 147 lb (66.7 kg)   SpO2 99%   BMI 20.50 kg/m  Wt Readings from Last 3 Encounters:  09/29/19 147 lb (66.7 kg)  09/13/19 137 lb 8 oz (62.4 kg)  04/28/19 146 lb (66.2 kg)     Health Maintenance Due  Topic Date Due  . URINE MICROALBUMIN  Never done    There are no preventive care reminders to display for this patient.  Lab Results  Component Value Date   TSH 2.301 02/17/2015   Lab Results  Component Value Date   WBC 5.1 09/13/2019   HGB 9.9 (L) 09/13/2019   HCT 26.8 (L) 09/13/2019   MCV 118.1 (H) 09/13/2019   PLT 123 (L) 09/13/2019   Lab Results  Component Value Date   NA 137 09/13/2019   K 3.6 09/13/2019   CO2 29 09/13/2019   GLUCOSE 81 09/13/2019   BUN 17 09/13/2019   CREATININE 2.97 (H) 09/13/2019   BILITOT 2.1 (H) 09/12/2019   ALKPHOS 81 09/12/2019   AST 26 09/12/2019   ALT 26 09/12/2019   PROT 6.5 09/12/2019   ALBUMIN 3.3 (L) 09/12/2019   CALCIUM 9.3 09/13/2019   ANIONGAP 8 09/13/2019   GFR 46.63 (L) 01/08/2012   Lab Results  Component Value Date   CHOL 114 (L) 02/17/2015   Lab Results  Component Value Date   HDL 31 (L) 02/17/2015   Lab Results  Component  Value Date   LDLCALC 68 02/17/2015   Lab Results  Component Value Date   TRIG 76 02/17/2015   Lab Results  Component Value Date   CHOLHDL 3.7 02/17/2015   Lab Results  Component Value Date   HGBA1C <4.0  06/07/2011      Assessment & Plan:   Problem List Items Addressed This Visit      Cardiovascular and Mediastinum   Chronic heart failure with preserved ejection fraction (HCC)     Genitourinary   End stage renal disease ([place])     Other   Hb-SS disease without crisis ([place]) - Primary   Relevant Orders   CBC with Differential (Completed)   Comprehensive metabolic panel (Completed)     Hb-SS disease without crisis ([place]) Sickle cell disease - Continue Hydrea 500 mg daily. We discussed the need for good hydration, monitoring of hydration status, avoidance of heat, cold, stress, and infection triggers. We discussed the risks and benefits of Hydrea, including bone marrow suppression, the possibility of GI upset, skin ulcers, and hair thinning. The patient was reminded of the need to seek medical attention of any symptoms of bleeding, anemia, or infection. Continue folic acid 1 mg daily to prevent aplastic bone marrow crises.    Pulmonary evaluation - Patient denies severe recurrent wheezes, shortness of breath with exercise, or persistent cough. If these symptoms develop, pulmonary function tests with spirometry will be ordered, and if abnormal, plan on referral to Pulmonology for further evaluation.  Cardiac - Currently followed by cardiology  Eye - Patient legally blind due to retinopathy. He is followed by ophthalmology   Acute and chronic painful episodes -  No changes in pain medication regimen on today.  - CBC with Differential - Comprehensive metabolic panel  End stage renal disease [facility]) Patient will continue dialysis on Monday, Wednesday, and Friday per nephrology.  Chronic heart failure with preserved ejection fraction [facility]) Discussed the importance of regular follow up with CHF clinic. He has an appointment scheduled on 10/01/2019    Follow-up: Return in about 3 months (around 12/29/2019).    Cammie Sickle, FNP

## 2019-09-30 LAB — COMPREHENSIVE METABOLIC PANEL
ALT: 11 IU/L (ref 0–44)
AST: 32 IU/L (ref 0–40)
Albumin/Globulin Ratio: 1.5 (ref 1.2–2.2)
Albumin: 4.4 g/dL (ref 3.8–4.8)
Alkaline Phosphatase: 121 IU/L — ABNORMAL HIGH (ref 39–117)
BUN/Creatinine Ratio: 7 — ABNORMAL LOW (ref 10–24)
BUN: 38 mg/dL — ABNORMAL HIGH (ref 8–27)
Bilirubin Total: 1.3 mg/dL — ABNORMAL HIGH (ref 0.0–1.2)
CO2: 24 mmol/L (ref 20–29)
Calcium: 9.2 mg/dL (ref 8.6–10.2)
Chloride: 92 mmol/L — ABNORMAL LOW (ref 96–106)
Creatinine, Ser: 5.12 mg/dL — ABNORMAL HIGH (ref 0.76–1.27)
GFR calc Af Amer: 13 mL/min/{1.73_m2} — ABNORMAL LOW (ref >59)
GFR calc non Af Amer: 11 mL/min/{1.73_m2} — ABNORMAL LOW (ref >59)
Globulin, Total: 2.9 g/dL (ref 1.5–4.5)
Glucose: 108 mg/dL — ABNORMAL HIGH (ref 65–99)
Potassium: 4.5 mmol/L (ref 3.5–5.2)
Sodium: 132 mmol/L — ABNORMAL LOW (ref 134–144)
Total Protein: 7.3 g/dL (ref 6.0–8.5)

## 2019-09-30 LAB — CBC WITH DIFFERENTIAL/PLATELET
Basophils Absolute: 0.1 10*3/uL (ref 0.0–0.2)
Basos: 2 %
EOS (ABSOLUTE): 0.3 10*3/uL (ref 0.0–0.4)
Eos: 7 %
Hematocrit: 26 % — ABNORMAL LOW (ref 37.5–51.0)
Hemoglobin: 9.4 g/dL — ABNORMAL LOW (ref 13.0–17.7)
Immature Grans (Abs): 0.1 10*3/uL (ref 0.0–0.1)
Immature Granulocytes: 1 %
Lymphocytes Absolute: 1.2 10*3/uL (ref 0.7–3.1)
Lymphs: 25 %
MCH: 43.9 pg — ABNORMAL HIGH (ref 26.6–33.0)
MCHC: 36.2 g/dL — ABNORMAL HIGH (ref 31.5–35.7)
MCV: 122 fL — ABNORMAL HIGH (ref 79–97)
Monocytes Absolute: 0.7 10*3/uL (ref 0.1–0.9)
Monocytes: 14 %
NRBC: 1 % — ABNORMAL HIGH (ref 0–0)
Neutrophils Absolute: 2.4 10*3/uL (ref 1.4–7.0)
Neutrophils: 51 %
Platelets: 125 10*3/uL — ABNORMAL LOW (ref 150–450)
RBC: 2.14 x10E6/uL — CL (ref 4.14–5.80)
RDW: 15.2 % (ref 11.6–15.4)
WBC: 4.7 10*3/uL (ref 3.4–10.8)

## 2019-10-01 ENCOUNTER — Other Ambulatory Visit (HOSPITAL_COMMUNITY): Payer: Self-pay | Admitting: *Deleted

## 2019-10-01 ENCOUNTER — Encounter (HOSPITAL_COMMUNITY): Payer: Self-pay

## 2019-10-01 ENCOUNTER — Ambulatory Visit: Payer: PPO

## 2019-10-01 ENCOUNTER — Other Ambulatory Visit: Payer: Self-pay

## 2019-10-01 ENCOUNTER — Ambulatory Visit
Admission: RE | Admit: 2019-10-01 | Discharge: 2019-10-01 | Disposition: A | Discharge status: Home / Self Care | Payer: PPO | Source: Ambulatory Visit | Attending: Internal Medicine | Admitting: Internal Medicine

## 2019-10-01 VITALS — BP 100/66 | HR 63 | Wt 148.0 lb

## 2019-10-01 DIAGNOSIS — Z992 Dependence on renal dialysis: Secondary | ICD-10-CM | POA: Insufficient documentation

## 2019-10-01 DIAGNOSIS — M109 Gout, unspecified: Secondary | ICD-10-CM | POA: Diagnosis not present

## 2019-10-01 DIAGNOSIS — Z79899 Other long term (current) drug therapy: Secondary | ICD-10-CM | POA: Diagnosis not present

## 2019-10-01 DIAGNOSIS — Z8249 Family history of ischemic heart disease and other diseases of the circulatory system: Secondary | ICD-10-CM | POA: Insufficient documentation

## 2019-10-01 DIAGNOSIS — I4819 Other persistent atrial fibrillation: Secondary | ICD-10-CM

## 2019-10-01 DIAGNOSIS — Z7901 Long term (current) use of anticoagulants: Secondary | ICD-10-CM | POA: Insufficient documentation

## 2019-10-01 DIAGNOSIS — I4891 Unspecified atrial fibrillation: Secondary | ICD-10-CM | POA: Diagnosis not present

## 2019-10-01 DIAGNOSIS — H548 Legal blindness, as defined in USA: Secondary | ICD-10-CM | POA: Diagnosis not present

## 2019-10-01 DIAGNOSIS — N184 Chronic kidney disease, stage 4 (severe): Secondary | ICD-10-CM | POA: Insufficient documentation

## 2019-10-01 DIAGNOSIS — I272 Pulmonary hypertension, unspecified: Secondary | ICD-10-CM | POA: Diagnosis not present

## 2019-10-01 DIAGNOSIS — I130 Hypertensive heart and chronic kidney disease with heart failure and stage 1 through stage 4 chronic kidney disease, or unspecified chronic kidney disease: Secondary | ICD-10-CM | POA: Insufficient documentation

## 2019-10-01 DIAGNOSIS — D571 Sickle-cell disease without crisis: Secondary | ICD-10-CM | POA: Insufficient documentation

## 2019-10-01 DIAGNOSIS — Z23 Encounter for immunization: Secondary | ICD-10-CM

## 2019-10-01 DIAGNOSIS — F17210 Nicotine dependence, cigarettes, uncomplicated: Secondary | ICD-10-CM | POA: Diagnosis not present

## 2019-10-01 DIAGNOSIS — I5022 Chronic systolic (congestive) heart failure: Secondary | ICD-10-CM | POA: Diagnosis not present

## 2019-10-01 DIAGNOSIS — I428 Other cardiomyopathies: Secondary | ICD-10-CM | POA: Insufficient documentation

## 2019-10-01 LAB — BASIC METABOLIC PANEL
Anion gap: 12 (ref 5–15)
BUN: 37 mg/dL — ABNORMAL HIGH (ref 8–23)
CO2: 26 mmol/L (ref 22–32)
Calcium: 8.8 mg/dL — ABNORMAL LOW (ref 8.9–10.3)
Chloride: 96 mmol/L — ABNORMAL LOW (ref 98–111)
Creatinine, Ser: 5.21 mg/dL — ABNORMAL HIGH (ref 0.61–1.24)
GFR calc Af Amer: 13 mL/min — ABNORMAL LOW (ref >60)
GFR calc non Af Amer: 11 mL/min — ABNORMAL LOW (ref >60)
Glucose, Bld: 112 mg/dL — ABNORMAL HIGH (ref 70–99)
Potassium: 4.3 mmol/L (ref 3.5–5.1)
Sodium: 134 mmol/L — ABNORMAL LOW (ref 135–145)

## 2019-10-01 LAB — CBC
HCT: 23.4 % — ABNORMAL LOW (ref 39.0–52.0)
Hemoglobin: 8.7 g/dL — ABNORMAL LOW (ref 13.0–17.0)
MCH: 43.1 pg — ABNORMAL HIGH (ref 26.0–34.0)
MCHC: 37.2 g/dL — ABNORMAL HIGH (ref 30.0–36.0)
MCV: 115.8 fL — ABNORMAL HIGH (ref 80.0–100.0)
Platelets: 104 10*3/uL — ABNORMAL LOW (ref 150–400)
RBC: 2.02 MIL/uL — ABNORMAL LOW (ref 4.22–5.81)
RDW: 13.5 % (ref 11.5–15.5)
WBC: 5 10*3/uL (ref 4.0–10.5)
nRBC: 1 % — ABNORMAL HIGH (ref 0.0–0.2)

## 2019-10-01 NOTE — Progress Notes (Signed)
   Covid-19 Vaccination Clinic  Name:  Patrick [name]    MRN: [id number] DOB: Aug 14, 1955  10/01/2019  [name] was observed post Covid-19 immunization for 15 minutes without incident. He was provided with Vaccine Information Sheet and instruction to access the V-Safe system.   [name] was instructed to call 911 with any severe reactions post vaccine: Marland Kitchen Difficulty breathing  . Swelling of face and throat  . A fast heartbeat  . A bad rash all over body  . Dizziness and weakness   Immunizations Administered    Name Date Dose VIS Date Route   Pfizer COVID-19 Vaccine 10/01/2019  9:54 AM 0.3 mL 05/29/2019 Intramuscular   Manufacturer: [place]   Lot: [id number]   [place]: [address]-4

## 2019-10-01 NOTE — Patient Instructions (Signed)
Labs done today, we will contact you for any abnormal results  Your physician has recommended that you have a Cardioversion (DCCV). Electrical Cardioversion uses a jolt of electricity to your heart either through paddles or wired patches attached to your chest. This is a controlled, usually prescheduled, procedure. Defibrillation is done under light anesthesia in the hospital, and you usually go home the day of the procedure. This is done to get your heart back into a normal rhythm. You are not awake for the procedure. Please see the instruction sheet given to you today.  Your physician recommends that you schedule a follow-up appointment in: 3 weeks   Cardioversion Instructions:  You are scheduled for a Cardioversion on Tuesday April 20th with Dr. Haroldine Laws.  Please arrive at the [facility] (Main Entrance A) at [facility]: [address] [place], [place] [address] at 8:00 am  DIET: Nothing to eat or drink after midnight except a sip of water with medications (see medication instructions below)  Medication Instructions: Hold Torsemide AM of test  Continue your anticoagulant: Eliquis You will need to continue your anticoagulant after your procedure until you  are told by your  Provider that it is safe to stop   Labs: COVID TEST: tomorrow Friday 4/16 at 10:40 (after you finish with dialysis)  [place] must have a responsible person to drive you home and stay in the waiting area during your procedure. Failure to do so could result in cancellation.  Bring your insurance cards.  *Special Note: Every effort is made to have your procedure done on time. Occasionally there are emergencies that occur at the hospital that may cause delays. Please be patient if a delay does occur.

## 2019-10-01 NOTE — Progress Notes (Signed)
Patient ID: Patrick [name], male   DOB: [birth year], 64 y.o.   MRN: [id number]     Advanced Heart Failure Clinic Note   PCP: [name] : Dr Zigmund Daniel  Opthalmologist: [name] HF MD: Dr Haroldine Laws  HPI: Patrick [name] is a 64 y.o. male  with a history of sickle cell anemia, systolic HF due to NICM EF 40% 11/2012 initially diagnosed 05/2011, no CAD by cath 05/2011, CKD stage III, legally blind, and tobacco abuse.   Admitted 11/18/12 from HF clinic with voume overload. Hemoglobin 6.7 Transfused 2 UPRBC. Diuresed with IV lasix. Lisinopril stopped due to CKD. Discharge weight 143 pounds.    Admitted to [facility] 3/4 through 08/22/13 with anemia and volume overload. Hemglobin was 5.9. Received 1U PRBC with hemoglobin increased to 6.4 Diuresed with IV lasix. Discharged on carvedilol 25 mg twice a day, lasix 80 mg twice a day, hydralazine 25 mg tid, and imdur 30 mg daily.  D/C weight was 140 pounds.  He is not on Spironolactone due to hyperkalemia. He is not on ace inhibitor due to renal failure.  Admitted 7/6 through 10/02/3843 with A/C systolic heart failure and CKD in setting of new A fib. Started coumadin. Discharge weight was 135 pounds.   Admitted 4/3 - 09/24/17 with upper GI bleed and profound anemia with Hgb of 4.6 with Sickle Cell. Transfused with RBCs. EGD showed nonbleeding gastritis, biopsies were obtained. Pathology was pending at discharge. Colonoscopy showed small internal hemorrhoids, otherwise normal. Pt refused resumption of his AC and wished for follow up with his cardiologist prior to starting. Hydralazine, Imdur, and Torsemide were STOPPED.  Admitted 36/4680 with A/C systolic HF and sickle cell anemia. Diuresed with IV lasix and transitioned to torsemide 100 mg daily. Found to be back in Afib. Eliquis was continued. He was started on amio drip and underwent DCCV 12/19. Amio transitioned to PO. He required 1 uPRBCs for anemia. He also had chest pain with negative troponin and no  EKG changes. Myoview showed no significant ischemia, EF 46% (reviewed by Dr Hubert Azure).  HF meds optimized. DC weight: 144 lbs  Today he returns for HF follow up. Has not been seen in over a year. Last clinic f/u was 07/2018. He has since started HD, which was started 11/20. On MWF schedule. Appears to be back in afib. Per review of EKGs, he has been in afib since at least 10/20. HR is controlled. Reports full compliance w/ Eliquis. No missed doses in the last 30 days. Denies palpitations. No further issues w/ bleeding.  He had recent hospitalization 3/21-2/24 for a/c systolic HF w/ fluid overload. He had been compliant w/ HD but acute exacerbation related to dietary indiscretion w/ sodium. He was admitted and underwent an extra HD session and nephrology reduced his EDW goal. Volume improved w/ HD. EKG showed rate controlled afib. 2D echo showed LVEF 35-40%, low normal RV function,severe pulm HTN. Severe LAE, mod RAE, mod MR, severe TR. RV TAPSE 1.6, RV s' 10. General cardiology was consulted and he was evaluated by Dr. Harl Bowie. He elected to stop hydral and imdur and started him on Entresto 24-26 bid. He was continued on coreg 12.5 bid and advised to f/u in [facility] for further management and med titration.  He reports he is doing well today. Symptoms much improved. No resting or exertional dyspnea. No LEE, othopnea/PND. Compliant w/ HD. No hypotension w/ Entresto. BP 100/66. HR 63 bpm. Remains in afib by EKG.   01/17/12 ECHO 55%-60%  11/20/12 ECHO EF 40% Peak PA pressure 56 11/30/13 ECHO 40-45%  12/2015: ECHO 40%.  07/2017: EF 40-45% 05/2018: EF 25-30% 08/2019: LVEF 35-40%, low normal RV function,severe pulm HTN.  SH: Lives with his cousin in [place]. Smokes 8 cigarettes per day. Occasional ETOH and no drugs  FH: Mother deceased: liver failure, no CAD        Father deceased: CAD, MI  Review of systems complete and found to be negative unless listed in HPI.   Past Medical History:  Diagnosis Date  .  Blood dyscrasia   . Blood transfusion    "I've had a bunch of them"  . CHF (congestive heart failure) ([place])    followed by hf clinic  . Chronic kidney disease (CKD), stage III (moderate)   . Dialysis patient ([place]) 04/2019   AV fistula (right arm)  . Gout   . JASNKNLZ(767.3)    "probably weekly" (01/13/2014)  . Legally blind   . Shortness of breath dyspnea   . Sickle cell disease, type SS ([place])   . Swelling abdomen   . Swelling of extremity, left   . Swelling of extremity, right     Current Outpatient Medications  Medication Sig Dispense Refill  . acetaminophen (TYLENOL) 325 MG tablet Take 650 mg by mouth every 6 (six) hours as needed.    Marland Kitchen allopurinol (ZYLOPRIM) 100 MG tablet Take 100 mg by mouth 2 (two) times daily.    . carvedilol (COREG) 12.5 MG tablet TAKE 1 TABLET BY MOUTH 2 (TWO) TIMES DAILY WITH A MEAL. (Patient taking differently: Take 12.5 mg by mouth 2 (two) times daily with a meal. ) 180 tablet 2  . ELIQUIS 5 MG TABS tablet TAKE 1 TABLET BY MOUTH TWICE A DAY (Patient taking differently: Take 5 mg by mouth 2 (two) times daily. ) 60 tablet 5  . ferric citrate (AURYXIA) 1 GM 210 MG(Fe) tablet Take 420 mg by mouth 3 (three) times daily with meals.    . folic acid (FOLVITE) 1 MG tablet TAKE 1 TABLET BY MOUTH EVERY DAY 90 tablet 3  . gabapentin (NEURONTIN) 100 MG capsule TAKE 1 CAPSULE BY MOUTH AT BEDTIME. 30 capsule 3  . hydroxyurea (HYDREA) 500 MG capsule TAKE ONE CAPSULE BY MOUTH EVERY DAY WITH FOOD TO MINIMIZE GI SIDE EFFECTS 90 capsule 3  . Oxycodone HCl 10 MG TABS Take 1 tablet (10 mg total) by mouth every 4 (four) hours as needed. 60 tablet 0  . sacubitril-valsartan (ENTRESTO) 24-26 MG Take 1 tablet by mouth 2 (two) times daily. 60 tablet 0  . sevelamer carbonate (RENVELA) 800 MG tablet Take 800 mg by mouth 3 (three) times daily with meals.     . sildenafil (VIAGRA) 50 MG tablet Take 1 tablet (50 mg total) by mouth daily as needed for erectile dysfunction. Do not take same  day as Isosorbide 10 tablet 0  . torsemide (DEMADEX) 100 MG tablet Take 1 tablet (100 mg total) by mouth daily. Pt needs appointment for future refills. Call [phone] to schedule 30 tablet 1   No current facility-administered medications for this encounter.   Vitals:   10/01/19 1424  BP: 100/66  Pulse: 63  SpO2: 98%  Weight: 67.1 kg (148 lb)     Wt Readings from Last 3 Encounters:  10/01/19 67.1 kg (148 lb)  09/29/19 66.7 kg (147 lb)  09/13/19 62.4 kg (137 lb 8 oz)   PHYSICAL EXAM: General:  Well appearing. No respiratory difficulty HEENT: normal Neck: supple. no  JVD. Carotids 2+ bilat; no bruits. No lymphadenopathy or thyromegaly appreciated. Cor: PMI nondisplaced. Irregularly irregular rhythm, regular rate. No rubs, gallops or murmurs. Lungs: clear Abdomen: soft, nontender, nondistended. No hepatosplenomegaly. No bruits or masses. Good bowel sounds. Extremities: no cyanosis, clubbing, rash, edema Neuro: alert & oriented x 3, cranial nerves grossly intact. moves all 4 extremities w/o difficulty. Affect pleasant.   ASSESSMENT & PLAN:  1. Chronic Combined Heart Failure:  NICM. Cath 05/2011 without significant coronary disease.  ECHO 12/2015 40%. Echo 2/19 EF 40-45%. Echo 05/2018: EF 25-30%. Echo 3/21 LVEF 35-40%, low normal RV function,severe pulm HTN. Severe LAE, mod RAE, mod MR, severe TR. RV TAPSE 1.6, RV s' 10 - NYHA II. - Volume now controlled through HD  -Continue coreg 12.5 BID. BP and HR both limit further titration  - Hydralazine + Imdur recently discontinued by Dr. Harl Bowie 3/21 to allow BP room for [facility] addition. BP too soft to restart - Tolerating Entresto 24-26 bid ok. Discussed w/ pharmD. Ok to continue despite being on HD, as long as no issues w/ hypotension or hyperkalemia. His BP is soft but asymptomatic. Check BMP today to check K, although he is getting dialyzed 3 days a week.  - Will hold off on dig and spiro due to CKD - It is suspected his pulmonary HTN  is primarily 2/2 left sided heart disease (see hospital notes). However he may need repeat RHC in the near future. Will defer to Dr. Haroldine Laws at his next f/u visit 2. HTN: - Soft but stable. Denies any issues during HD. Will not adjust meds today 3  Sickle Cell:   - Follows closely with Sickle Cell Clinic.  4. Former smoker:   - Stopped 11/2015.  5. CKD stable IV:  - Followed by Dr. Lorrene Reid. Now on HD MWF 6. Persistent Atrial Fibrillation -  s/p DCCV 05/2018 - per review of EKGs, he has been back in afib since at least 10/21 - This patients CHA2DS2-VASc is at least 3.  - On Eliquis 5 mg BID and reports full compliance. No missed doses in the last 30 days - given his HF, will try to cardiovert back to NSR, although may not hold given chronicity and LAE. He is willing to try. Will set up for DCCV w/ Dr. Haroldine Laws  7. H/o GI bleeding: - Prior EGD in 2019 showed nonbleeding gastritis, Colonoscopy showed small internal hemorrhoids, otherwise normal.  - No recurrence on Eliquis. Check CBC today   F/u 1-2 weeks post DCCV   Lyda Jester, PA-C  10/01/2019

## 2019-10-01 NOTE — H&P (View-Only) (Signed)
Patient ID: Patrick [name], male   DOB: Dec 08, 1955, 64 y.o.   MRN: [id number]     Advanced Heart Failure Clinic Note   PCP: [name] : Dr Zigmund Daniel  Opthalmologist: [name] HF MD: Dr Haroldine Laws  HPI: Patrick [name] is a 64 y.o. male  with a history of sickle cell anemia, systolic HF due to NICM EF 40% 11/2012 initially diagnosed 05/2011, no CAD by cath 05/2011, CKD stage III, legally blind, and tobacco abuse.   Admitted 11/18/12 from HF clinic with voume overload. Hemoglobin 6.7 Transfused 2 UPRBC. Diuresed with IV lasix. Lisinopril stopped due to CKD. Discharge weight 143 pounds.    Admitted to [facility] 3/4 through 08/22/13 with anemia and volume overload. Hemglobin was 5.9. Received 1U PRBC with hemoglobin increased to 6.4 Diuresed with IV lasix. Discharged on carvedilol 25 mg twice a day, lasix 80 mg twice a day, hydralazine 25 mg tid, and imdur 30 mg daily.  D/C weight was 140 pounds.  He is not on Spironolactone due to hyperkalemia. He is not on ace inhibitor due to renal failure.  Admitted 7/6 through 7/74/1287 with A/C systolic heart failure and CKD in setting of new A fib. Started coumadin. Discharge weight was 135 pounds.   Admitted 4/3 - 09/24/17 with upper GI bleed and profound anemia with Hgb of 4.6 with Sickle Cell. Transfused with RBCs. EGD showed nonbleeding gastritis, biopsies were obtained. Pathology was pending at discharge. Colonoscopy showed small internal hemorrhoids, otherwise normal. Pt refused resumption of his AC and wished for follow up with his cardiologist prior to starting. Hydralazine, Imdur, and Torsemide were STOPPED.  Admitted 86/7672 with A/C systolic HF and sickle cell anemia. Diuresed with IV lasix and transitioned to torsemide 100 mg daily. Found to be back in Afib. Eliquis was continued. He was started on amio drip and underwent DCCV 12/19. Amio transitioned to PO. He required 1 uPRBCs for anemia. He also had chest pain with negative troponin and no  EKG changes. Myoview showed no significant ischemia, EF 46% (reviewed by Dr Hubert Azure).  HF meds optimized. DC weight: 144 lbs  Today he returns for HF follow up. Has not been seen in over a year. Last clinic f/u was 07/2018. He has since started HD, which was started 11/20. On MWF schedule. Appears to be back in afib. Per review of EKGs, he has been in afib since at least 10/20. HR is controlled. Reports full compliance w/ Eliquis. No missed doses in the last 30 days. Denies palpitations. No further issues w/ bleeding.  He had recent hospitalization 0/94-7/09 for a/c systolic HF w/ fluid overload. He had been compliant w/ HD but acute exacerbation related to dietary indiscretion w/ sodium. He was admitted and underwent an extra HD session and nephrology reduced his EDW goal. Volume improved w/ HD. EKG showed rate controlled afib. 2D echo showed LVEF 35-40%, low normal RV function,severe pulm HTN. Severe LAE, mod RAE, mod MR, severe TR. RV TAPSE 1.6, RV s' 10. General cardiology was consulted and he was evaluated by Dr. Harl Bowie. He elected to stop hydral and imdur and started him on Entresto 24-26 bid. He was continued on coreg 12.5 bid and advised to f/u in [facility] for further management and med titration.  He reports he is doing well today. Symptoms much improved. No resting or exertional dyspnea. No LEE, othopnea/PND. Compliant w/ HD. No hypotension w/ Entresto. BP 100/66. HR 63 bpm. Remains in afib by EKG.   01/17/12 ECHO 55%-60%  11/20/12 ECHO EF 40% Peak PA pressure 56 11/30/13 ECHO 40-45%  12/2015: ECHO 40%.  07/2017: EF 40-45% 05/2018: EF 25-30% 08/2019: LVEF 35-40%, low normal RV function,severe pulm HTN.  SH: Lives with his cousin in [place]. Smokes 8 cigarettes per day. Occasional ETOH and no drugs  FH: Mother deceased: liver failure, no CAD        Father deceased: CAD, MI  Review of systems complete and found to be negative unless listed in HPI.   Past Medical History:  Diagnosis Date  .  Blood dyscrasia   . Blood transfusion    "I've had a bunch of them"  . CHF (congestive heart failure) ([place])    followed by hf clinic  . Chronic kidney disease (CKD), stage III (moderate)   . Dialysis patient ([place]) 04/2019   AV fistula (right arm)  . Gout   . PZWCHENI(778.2)    "probably weekly" (01/13/2014)  . Legally blind   . Shortness of breath dyspnea   . Sickle cell disease, type SS ([place])   . Swelling abdomen   . Swelling of extremity, left   . Swelling of extremity, right     Current Outpatient Medications  Medication Sig Dispense Refill  . acetaminophen (TYLENOL) 325 MG tablet Take 650 mg by mouth every 6 (six) hours as needed.    Marland Kitchen allopurinol (ZYLOPRIM) 100 MG tablet Take 100 mg by mouth 2 (two) times daily.    . carvedilol (COREG) 12.5 MG tablet TAKE 1 TABLET BY MOUTH 2 (TWO) TIMES DAILY WITH A MEAL. (Patient taking differently: Take 12.5 mg by mouth 2 (two) times daily with a meal. ) 180 tablet 2  . ELIQUIS 5 MG TABS tablet TAKE 1 TABLET BY MOUTH TWICE A DAY (Patient taking differently: Take 5 mg by mouth 2 (two) times daily. ) 60 tablet 5  . ferric citrate (AURYXIA) 1 GM 210 MG(Fe) tablet Take 420 mg by mouth 3 (three) times daily with meals.    . folic acid (FOLVITE) 1 MG tablet TAKE 1 TABLET BY MOUTH EVERY DAY 90 tablet 3  . gabapentin (NEURONTIN) 100 MG capsule TAKE 1 CAPSULE BY MOUTH AT BEDTIME. 30 capsule 3  . hydroxyurea (HYDREA) 500 MG capsule TAKE ONE CAPSULE BY MOUTH EVERY DAY WITH FOOD TO MINIMIZE GI SIDE EFFECTS 90 capsule 3  . Oxycodone HCl 10 MG TABS Take 1 tablet (10 mg total) by mouth every 4 (four) hours as needed. 60 tablet 0  . sacubitril-valsartan (ENTRESTO) 24-26 MG Take 1 tablet by mouth 2 (two) times daily. 60 tablet 0  . sevelamer carbonate (RENVELA) 800 MG tablet Take 800 mg by mouth 3 (three) times daily with meals.     . sildenafil (VIAGRA) 50 MG tablet Take 1 tablet (50 mg total) by mouth daily as needed for erectile dysfunction. Do not take same  day as Isosorbide 10 tablet 0  . torsemide (DEMADEX) 100 MG tablet Take 1 tablet (100 mg total) by mouth daily. Pt needs appointment for future refills. Call [phone] to schedule 30 tablet 1   No current facility-administered medications for this encounter.   Vitals:   10/01/19 1424  BP: 100/66  Pulse: 63  SpO2: 98%  Weight: 67.1 kg (148 lb)     Wt Readings from Last 3 Encounters:  10/01/19 67.1 kg (148 lb)  09/29/19 66.7 kg (147 lb)  09/13/19 62.4 kg (137 lb 8 oz)   PHYSICAL EXAM: General:  Well appearing. No respiratory difficulty HEENT: normal Neck: supple. no  JVD. Carotids 2+ bilat; no bruits. No lymphadenopathy or thyromegaly appreciated. Cor: PMI nondisplaced. Irregularly irregular rhythm, regular rate. No rubs, gallops or murmurs. Lungs: clear Abdomen: soft, nontender, nondistended. No hepatosplenomegaly. No bruits or masses. Good bowel sounds. Extremities: no cyanosis, clubbing, rash, edema Neuro: alert & oriented x 3, cranial nerves grossly intact. moves all 4 extremities w/o difficulty. Affect pleasant.   ASSESSMENT & PLAN:  1. Chronic Combined Heart Failure:  NICM. Cath 05/2011 without significant coronary disease.  ECHO 12/2015 40%. Echo 2/19 EF 40-45%. Echo 05/2018: EF 25-30%. Echo 3/21 LVEF 35-40%, low normal RV function,severe pulm HTN. Severe LAE, mod RAE, mod MR, severe TR. RV TAPSE 1.6, RV s' 10 - NYHA II. - Volume now controlled through HD  -Continue coreg 12.5 BID. BP and HR both limit further titration  - Hydralazine + Imdur recently discontinued by Dr. Harl Bowie 3/21 to allow BP room for [facility] addition. BP too soft to restart - Tolerating Entresto 24-26 bid ok. Discussed w/ pharmD. Ok to continue despite being on HD, as long as no issues w/ hypotension or hyperkalemia. His BP is soft but asymptomatic. Check BMP today to check K, although he is getting dialyzed 3 days a week.  - Will hold off on dig and spiro due to CKD - It is suspected his pulmonary HTN  is primarily 2/2 left sided heart disease (see hospital notes). However he may need repeat RHC in the near future. Will defer to Dr. Haroldine Laws at his next f/u visit 2. HTN: - Soft but stable. Denies any issues during HD. Will not adjust meds today 3  Sickle Cell:   - Follows closely with [facility].  4. Former smoker:   - Stopped 11/2015.  5. CKD stable IV:  - Followed by Dr. Lorrene Reid. Now on HD MWF 6. Persistent Atrial Fibrillation -  s/p DCCV 05/2018 - per review of EKGs, he has been back in afib since at least 10/21 - This patients CHA2DS2-VASc is at least 3.  - On Eliquis 5 mg BID and reports full compliance. No missed doses in the last 30 days - given his HF, will try to cardiovert back to NSR, although may not hold given chronicity and LAE. He is willing to try. Will set up for DCCV w/ Dr. Haroldine Laws  7. H/o GI bleeding: - Prior EGD in 2019 showed nonbleeding gastritis, Colonoscopy showed small internal hemorrhoids, otherwise normal.  - No recurrence on Eliquis. Check CBC today   F/u 1-2 weeks post DCCV   Lyda Jester, PA-C  10/01/2019

## 2019-10-02 ENCOUNTER — Other Ambulatory Visit (HOSPITAL_COMMUNITY)
Admission: RE | Admit: 2019-10-02 | Discharge: 2019-10-02 | Disposition: A | Discharge status: Home / Self Care | Payer: PPO | Source: Ambulatory Visit | Attending: Internal Medicine | Admitting: Internal Medicine

## 2019-10-02 DIAGNOSIS — Z20822 Contact with and (suspected) exposure to covid-19: Secondary | ICD-10-CM | POA: Diagnosis not present

## 2019-10-02 DIAGNOSIS — Z01812 Encounter for preprocedural laboratory examination: Secondary | ICD-10-CM | POA: Diagnosis not present

## 2019-10-02 LAB — SARS CORONAVIRUS 2 (TAT 6-24 HRS): SARS Coronavirus 2: NEGATIVE

## 2019-10-06 ENCOUNTER — Encounter (HOSPITAL_COMMUNITY)
Admission: RE | Disposition: A | Discharge status: Home / Self Care | Payer: Self-pay | Source: Home / Self Care | Attending: Internal Medicine

## 2019-10-06 ENCOUNTER — Ambulatory Visit (HOSPITAL_COMMUNITY)
Admission: RE | Admit: 2019-10-06 | Discharge: 2019-10-06 | Disposition: A | Discharge status: Home / Self Care | Payer: PPO | Attending: Internal Medicine | Admitting: Internal Medicine

## 2019-10-06 ENCOUNTER — Encounter (HOSPITAL_COMMUNITY): Payer: Self-pay | Admitting: Internal Medicine

## 2019-10-06 ENCOUNTER — Other Ambulatory Visit: Payer: Self-pay

## 2019-10-06 ENCOUNTER — Ambulatory Visit (HOSPITAL_COMMUNITY): Payer: PPO | Admitting: Certified Registered Nurse Anesthetist

## 2019-10-06 DIAGNOSIS — Z7901 Long term (current) use of anticoagulants: Secondary | ICD-10-CM | POA: Diagnosis not present

## 2019-10-06 DIAGNOSIS — I130 Hypertensive heart and chronic kidney disease with heart failure and stage 1 through stage 4 chronic kidney disease, or unspecified chronic kidney disease: Secondary | ICD-10-CM | POA: Diagnosis not present

## 2019-10-06 DIAGNOSIS — Z992 Dependence on renal dialysis: Secondary | ICD-10-CM | POA: Diagnosis not present

## 2019-10-06 DIAGNOSIS — I4891 Unspecified atrial fibrillation: Secondary | ICD-10-CM | POA: Diagnosis not present

## 2019-10-06 DIAGNOSIS — I272 Pulmonary hypertension, unspecified: Secondary | ICD-10-CM | POA: Insufficient documentation

## 2019-10-06 DIAGNOSIS — Z79899 Other long term (current) drug therapy: Secondary | ICD-10-CM | POA: Insufficient documentation

## 2019-10-06 DIAGNOSIS — D571 Sickle-cell disease without crisis: Secondary | ICD-10-CM | POA: Diagnosis not present

## 2019-10-06 DIAGNOSIS — I5022 Chronic systolic (congestive) heart failure: Secondary | ICD-10-CM | POA: Diagnosis not present

## 2019-10-06 DIAGNOSIS — H548 Legal blindness, as defined in USA: Secondary | ICD-10-CM | POA: Insufficient documentation

## 2019-10-06 DIAGNOSIS — I428 Other cardiomyopathies: Secondary | ICD-10-CM | POA: Diagnosis not present

## 2019-10-06 DIAGNOSIS — I4819 Other persistent atrial fibrillation: Secondary | ICD-10-CM | POA: Diagnosis not present

## 2019-10-06 DIAGNOSIS — M109 Gout, unspecified: Secondary | ICD-10-CM | POA: Diagnosis not present

## 2019-10-06 DIAGNOSIS — I509 Heart failure, unspecified: Secondary | ICD-10-CM | POA: Diagnosis not present

## 2019-10-06 DIAGNOSIS — N183 Chronic kidney disease, stage 3 unspecified: Secondary | ICD-10-CM | POA: Insufficient documentation

## 2019-10-06 DIAGNOSIS — F17210 Nicotine dependence, cigarettes, uncomplicated: Secondary | ICD-10-CM | POA: Insufficient documentation

## 2019-10-06 HISTORY — PX: CARDIOVERSION: SHX1299

## 2019-10-06 SURGERY — CARDIOVERSION
Anesthesia: General

## 2019-10-06 MED ORDER — LIDOCAINE 2% (20 MG/ML) 5 ML SYRINGE
INTRAMUSCULAR | Status: DC | PRN
  Administered 2019-10-06: 100 mg via INTRAVENOUS

## 2019-10-06 MED ORDER — SODIUM CHLORIDE 0.9 % IV SOLN: INTRAVENOUS | Status: DC | PRN

## 2019-10-06 MED ORDER — APIXABAN 5 MG PO TABS
5.0000 mg | ORAL_TABLET | ORAL | Status: AC
  Administered 2019-10-06: 5 mg via ORAL
  Filled 2019-10-06: qty 1

## 2019-10-06 MED ORDER — PROPOFOL 10 MG/ML IV BOLUS
INTRAVENOUS | Status: DC | PRN
  Administered 2019-10-06: 80 mg via INTRAVENOUS

## 2019-10-06 MED ORDER — SODIUM CHLORIDE 0.9 % IV SOLN: INTRAVENOUS | Status: DC

## 2019-10-06 NOTE — Interval H&P Note (Signed)
History and Physical Interval Note:  10/06/2019 8:34 AM  Patrick [name]  has presented today for surgery, with the diagnosis of AFIB.  The various methods of treatment have been discussed with the patient and family. After consideration of risks, benefits and other options for treatment, the patient has consented to  Procedure(s): CARDIOVERSION (N/A) as a surgical intervention.  The patient's history has been reviewed, patient examined, no change in status, stable for surgery.  I have reviewed the patient's chart and labs.  Questions were answered to the patient's satisfaction.     [name]

## 2019-10-06 NOTE — Transfer of Care (Signed)
Immediate Anesthesia Transfer of Care Note  Patient: Patrick [name]  Procedure(s) Performed: CARDIOVERSION (N/A )  Patient Location: Endoscopy Unit  Anesthesia Type:General  Level of Consciousness: awake and drowsy  Airway & Oxygen Therapy: Patient Spontanous Breathing  Post-op Assessment: Report given to RN, Post -op Vital signs reviewed and stable and Patient moving all extremities X 4  Post vital signs: Reviewed and stable  Last Vitals:  Vitals Value Taken Time  BP    Temp    Pulse    Resp    SpO2      Last Pain:  Vitals:   10/06/19 0810  TempSrc: Oral  PainSc: 0-No pain         Complications: No apparent anesthesia complications

## 2019-10-06 NOTE — Discharge Instructions (Signed)
Electrical Cardioversion Electrical cardioversion is the delivery of a jolt of electricity to restore a normal rhythm to the heart. A rhythm that is too fast or is not regular keeps the heart from pumping well. In this procedure, sticky patches or metal paddles are placed on the chest to deliver electricity to the heart from a device. This procedure may be done in an emergency if:  There is low or no blood pressure as a result of the heart rhythm.  Normal rhythm must be restored as fast as possible to protect the brain and heart from further damage.  It may save a life. This may also be a scheduled procedure for irregular or fast heart rhythms that are not immediately life-threatening. Tell a health care provider about:  Any allergies you have.  All medicines you are taking, including vitamins, herbs, eye drops, creams, and over-the-counter medicines.  Any problems you or family members have had with anesthetic medicines.  Any blood disorders you have.  Any surgeries you have had.  Any medical conditions you have.  Whether you are pregnant or may be pregnant. What are the risks? Generally, this is a safe procedure. However, problems may occur, including:  Allergic reactions to medicines.  A blood clot that breaks free and travels to other parts of your body.  The possible return of an abnormal heart rhythm within hours or days after the procedure.  Your heart stopping (cardiac arrest). This is rare. What happens before the procedure? Medicines  Your health care provider may have you start taking: ? Blood-thinning medicines (anticoagulants) so your blood does not clot as easily. ? Medicines to help stabilize your heart rate and rhythm.  Ask your health care provider about: ? Changing or stopping your regular medicines. This is especially important if you are taking diabetes medicines or blood thinners. ? Taking medicines such as aspirin and ibuprofen. These medicines can  thin your blood. Do not take these medicines unless your health care provider tells you to take them. ? Taking over-the-counter medicines, vitamins, herbs, and supplements. General instructions  Follow instructions from your health care provider about eating or drinking restrictions.  Plan to have someone take you home from the hospital or clinic.  If you will be going home right after the procedure, plan to have someone with you for 24 hours.  Ask your health care provider what steps will be taken to help prevent infection. These may include washing your skin with a germ-killing soap. What happens during the procedure?   An IV will be inserted into one of your veins.  Sticky patches (electrodes) or metal paddles may be placed on your chest.  You will be given a medicine to help you relax (sedative).  An electrical shock will be delivered. The procedure may vary among health care providers and hospitals. What can I expect after the procedure?  Your blood pressure, heart rate, breathing rate, and blood oxygen level will be monitored until you leave the hospital or clinic.  Your heart rhythm will be watched to make sure it does not change.  You may have some redness on the skin where the shocks were given. Follow these instructions at home:  Do not drive for 24 hours if you were given a sedative during your procedure.  Take over-the-counter and prescription medicines only as told by your health care provider.  Ask your health care provider how to check your pulse. Check it often.  Rest for 48 hours after the procedure or   as told by your health care provider.  Avoid or limit your caffeine use as told by your health care provider.  Keep all follow-up visits as told by your health care provider. This is important. Contact a health care provider if:  You feel like your heart is beating too quickly or your pulse is not regular.  You have a serious muscle cramp that does not go  away. Get help right away if:  You have discomfort in your chest.  You are dizzy or you feel faint.  You have trouble breathing or you are short of breath.  Your speech is slurred.  You have trouble moving an arm or leg on one side of your body.  Your fingers or toes turn cold or blue. Summary  Electrical cardioversion is the delivery of a jolt of electricity to restore a normal rhythm to the heart.  This procedure may be done right away in an emergency or may be a scheduled procedure if the condition is not an emergency.  Generally, this is a safe procedure.  After the procedure, check your pulse often as told by your health care provider. This information is not intended to replace advice given to you by your health care provider. Make sure you discuss any questions you have with your health care provider. Document Revised: 01/05/2019 Document Reviewed: 01/05/2019 Elsevier Patient Education  2020 [employer]  

## 2019-10-06 NOTE — Anesthesia Procedure Notes (Signed)
Procedure Name: General with mask airway Date/Time: 10/06/2019 9:02 AM Performed by: Harden Mo, CRNA Pre-anesthesia Checklist: Patient identified, Emergency Drugs available, Suction available and Patient being monitored Patient Re-evaluated:Patient Re-evaluated prior to induction Oxygen Delivery Method: Ambu bag Preoxygenation: Pre-oxygenation with 100% oxygen Induction Type: IV induction Placement Confirmation: positive ETCO2 and breath sounds checked- equal and bilateral Dental Injury: Teeth and Oropharynx as per pre-operative assessment

## 2019-10-06 NOTE — Anesthesia Postprocedure Evaluation (Signed)
Anesthesia Post Note  Patient: Patrick [name]  Procedure(s) Performed: CARDIOVERSION (N/A )     Patient location during evaluation: PACU Anesthesia Type: MAC Level of consciousness: awake and alert Pain management: pain level controlled Vital Signs Assessment: post-procedure vital signs reviewed and stable Respiratory status: spontaneous breathing, nonlabored ventilation, respiratory function stable and patient connected to nasal cannula oxygen Cardiovascular status: stable and blood pressure returned to baseline Postop Assessment: no apparent nausea or vomiting Anesthetic complications: no    Last Vitals:  Vitals:   10/06/19 0924 10/06/19 0934  BP: 132/68 (!) 121/98  Pulse: 70 68  Resp: 16 11  Temp:    SpO2: 100% 100%    Last Pain:  Vitals:   10/06/19 0934  TempSrc:   PainSc: 0-No pain                 Mykah Bellomo DAVID

## 2019-10-06 NOTE — Anesthesia Preprocedure Evaluation (Signed)
Anesthesia Evaluation  Patient identified by MRN, date of birth, ID band Patient awake    Reviewed: Allergy & Precautions, NPO status , Patient's Chart, lab work & pertinent test results  Airway Mallampati: II  TM Distance: >3 FB Neck ROM: Full    Dental   Pulmonary former smoker,    Pulmonary exam normal        Cardiovascular hypertension, Pt. on medications Normal cardiovascular exam     Neuro/Psych    GI/Hepatic   Endo/Other    Renal/GU Renal InsufficiencyRenal disease     Musculoskeletal   Abdominal   Peds  Hematology   Anesthesia Other Findings   Reproductive/Obstetrics                             Anesthesia Physical Anesthesia Plan  ASA: III  Anesthesia Plan: MAC   Post-op Pain Management:    Induction:   PONV Risk Score and Plan: Treatment may vary due to age or medical condition  Airway Management Planned: Nasal Cannula  Additional Equipment:   Intra-op Plan:   Post-operative Plan:   Informed Consent: I have reviewed the patients History and Physical, chart, labs and discussed the procedure including the risks, benefits and alternatives for the proposed anesthesia with the patient or authorized representative who has indicated his/her understanding and acceptance.       Plan Discussed with: CRNA and Surgeon  Anesthesia Plan Comments:         Anesthesia Quick Evaluation

## 2019-10-06 NOTE — CV Procedure (Signed)
    DIRECT CURRENT CARDIOVERSION  NAME:  Patrick [name]   MRN: [id number] DOB:  Jul 27, 1955   ADMIT DATE: 10/06/2019   INDICATIONS: Atrial fibrillation    PROCEDURE:   Informed consent was obtained prior to the procedure. The risks, benefits and alternatives for the procedure were discussed and the patient comprehended these risks. Once an appropriate time out was taken, the patient had the defibrillator pads placed in the anterior and posterior position. The patient then underwent sedation by the anesthesia service. Once an appropriate level of sedation was achieved, the patient received a single biphasic, synchronized 200J shock with no effect. I then repositioned the anterior pad and held manual pressure on it while I delivered another 200 J shock and there was prompt conversion to sinus rhythm. No apparent complications.  Glori Bickers, MD  9:13 AM

## 2019-10-16 DIAGNOSIS — I158 Other secondary hypertension: Secondary | ICD-10-CM | POA: Diagnosis not present

## 2019-10-16 DIAGNOSIS — Z992 Dependence on renal dialysis: Secondary | ICD-10-CM | POA: Diagnosis not present

## 2019-10-16 DIAGNOSIS — N186 End stage renal disease: Secondary | ICD-10-CM | POA: Diagnosis not present

## 2019-10-17 DIAGNOSIS — D689 Coagulation defect, unspecified: Secondary | ICD-10-CM | POA: Diagnosis not present

## 2019-10-17 DIAGNOSIS — Z992 Dependence on renal dialysis: Secondary | ICD-10-CM | POA: Diagnosis not present

## 2019-10-17 DIAGNOSIS — E876 Hypokalemia: Secondary | ICD-10-CM | POA: Diagnosis not present

## 2019-10-17 DIAGNOSIS — N186 End stage renal disease: Secondary | ICD-10-CM | POA: Diagnosis not present

## 2019-10-17 DIAGNOSIS — D509 Iron deficiency anemia, unspecified: Secondary | ICD-10-CM | POA: Diagnosis not present

## 2019-10-17 DIAGNOSIS — N2581 Secondary hyperparathyroidism of renal origin: Secondary | ICD-10-CM | POA: Diagnosis not present

## 2019-10-17 DIAGNOSIS — D631 Anemia in chronic kidney disease: Secondary | ICD-10-CM | POA: Diagnosis not present

## 2019-10-20 ENCOUNTER — Other Ambulatory Visit: Payer: Self-pay

## 2019-10-20 ENCOUNTER — Encounter (HOSPITAL_COMMUNITY): Payer: Self-pay

## 2019-10-20 ENCOUNTER — Ambulatory Visit (HOSPITAL_COMMUNITY)
Admission: RE | Admit: 2019-10-20 | Discharge: 2019-10-20 | Disposition: A | Discharge status: Home / Self Care | Payer: PPO | Source: Ambulatory Visit | Attending: Internal Medicine | Admitting: Internal Medicine

## 2019-10-20 VITALS — BP 128/64 | HR 117 | Wt 147.4 lb

## 2019-10-20 DIAGNOSIS — Z7901 Long term (current) use of anticoagulants: Secondary | ICD-10-CM | POA: Diagnosis not present

## 2019-10-20 DIAGNOSIS — I272 Pulmonary hypertension, unspecified: Secondary | ICD-10-CM | POA: Insufficient documentation

## 2019-10-20 DIAGNOSIS — Z8249 Family history of ischemic heart disease and other diseases of the circulatory system: Secondary | ICD-10-CM | POA: Diagnosis not present

## 2019-10-20 DIAGNOSIS — Z8379 Family history of other diseases of the digestive system: Secondary | ICD-10-CM | POA: Insufficient documentation

## 2019-10-20 DIAGNOSIS — I4892 Unspecified atrial flutter: Secondary | ICD-10-CM | POA: Diagnosis not present

## 2019-10-20 DIAGNOSIS — I10 Essential (primary) hypertension: Secondary | ICD-10-CM | POA: Diagnosis not present

## 2019-10-20 DIAGNOSIS — I4891 Unspecified atrial fibrillation: Secondary | ICD-10-CM | POA: Diagnosis not present

## 2019-10-20 DIAGNOSIS — I132 Hypertensive heart and chronic kidney disease with heart failure and with stage 5 chronic kidney disease, or end stage renal disease: Secondary | ICD-10-CM | POA: Insufficient documentation

## 2019-10-20 DIAGNOSIS — M109 Gout, unspecified: Secondary | ICD-10-CM | POA: Insufficient documentation

## 2019-10-20 DIAGNOSIS — I5022 Chronic systolic (congestive) heart failure: Secondary | ICD-10-CM

## 2019-10-20 DIAGNOSIS — N184 Chronic kidney disease, stage 4 (severe): Secondary | ICD-10-CM | POA: Diagnosis not present

## 2019-10-20 DIAGNOSIS — Z992 Dependence on renal dialysis: Secondary | ICD-10-CM | POA: Diagnosis not present

## 2019-10-20 DIAGNOSIS — D571 Sickle-cell disease without crisis: Secondary | ICD-10-CM | POA: Insufficient documentation

## 2019-10-20 DIAGNOSIS — F17210 Nicotine dependence, cigarettes, uncomplicated: Secondary | ICD-10-CM | POA: Insufficient documentation

## 2019-10-20 DIAGNOSIS — I428 Other cardiomyopathies: Secondary | ICD-10-CM | POA: Diagnosis not present

## 2019-10-20 DIAGNOSIS — I5042 Chronic combined systolic (congestive) and diastolic (congestive) heart failure: Secondary | ICD-10-CM | POA: Diagnosis not present

## 2019-10-20 DIAGNOSIS — I4819 Other persistent atrial fibrillation: Secondary | ICD-10-CM | POA: Insufficient documentation

## 2019-10-20 DIAGNOSIS — Z79899 Other long term (current) drug therapy: Secondary | ICD-10-CM | POA: Diagnosis not present

## 2019-10-20 DIAGNOSIS — N186 End stage renal disease: Secondary | ICD-10-CM | POA: Insufficient documentation

## 2019-10-20 DIAGNOSIS — H548 Legal blindness, as defined in USA: Secondary | ICD-10-CM | POA: Diagnosis not present

## 2019-10-20 DIAGNOSIS — I509 Heart failure, unspecified: Secondary | ICD-10-CM | POA: Diagnosis present

## 2019-10-20 NOTE — Progress Notes (Signed)
Patient ID: Patrick [name], male   DOB: 1956/05/12, 64 y.o.   MRN: [id number]     Advanced Heart Failure Clinic Note   PCP: [name] : Dr Zigmund Daniel  Opthalmologist: [name] HF MD: Dr Haroldine Laws Nephrologist: Dr Moshe Cipro.   HPI: Patrick [name] is a 64 y.o. male  with a history of sickle cell anemia, systolic HF due to NICM EF 40% 11/2012 initially diagnosed 05/2011, no CAD by cath 05/2011, CKD stage III, legally blind, and tobacco abuse.   Admitted 11/18/12 from HF clinic with voume overload. Hemoglobin 6.7 Transfused 2 UPRBC. Diuresed with IV lasix. Lisinopril stopped due to CKD. Discharge weight 143 pounds.    Admitted to [facility] 3/4 through 08/22/13 with anemia and volume overload. Hemglobin was 5.9. Received 1U PRBC with hemoglobin increased to 6.4 Diuresed with IV lasix. Discharged on carvedilol 25 mg twice a day, lasix 80 mg twice a day, hydralazine 25 mg tid, and imdur 30 mg daily.  D/C weight was 140 pounds.  He is not on Spironolactone due to hyperkalemia. He is not on ace inhibitor due to renal failure.  Admitted 7/6 through 6/81/2751 with A/C systolic heart failure and CKD in setting of new A fib. Started coumadin. Discharge weight was 135 pounds.   Admitted 4/3 - 09/24/17 with upper GI bleed and profound anemia with Hgb of 4.6 with Sickle Cell. Transfused with RBCs. EGD showed nonbleeding gastritis, biopsies were obtained. Pathology was pending at discharge. Colonoscopy showed small internal hemorrhoids, otherwise normal. Pt refused resumption of his AC and wished for follow up with his cardiologist prior to starting. Hydralazine, Imdur, and Torsemide were STOPPED.  Admitted 70/0174 with A/C systolic HF and sickle cell anemia. Diuresed with IV lasix and transitioned to torsemide 100 mg daily. Found to be back in Afib. Eliquis was continued. He was started on amio drip and underwent DCCV 12/19. Amio transitioned to PO. He required 1 uPRBCs for anemia. He also had chest  pain with negative troponin and no EKG changes. Myoview showed no significant ischemia, EF 46% (reviewed by Dr Hubert Azure).  HF meds optimized. DC weight: 144 lbs  Today he returns for HF follow up. Has not been seen in over a year. Last clinic f/u was 07/2018. He has since started HD, which was started 11/20. On MWF schedule. Appears to be back in afib. Per review of EKGs, he has been in afib since at least 10/20. HR is controlled. Reports full compliance w/ Eliquis. No missed doses in the last 30 days. Denies palpitations. No further issues w/ bleeding.  He had recent hospitalization 9/44-9/67 for a/c systolic HF w/ fluid overload. He had been compliant w/ HD but acute exacerbation related to dietary indiscretion w/ sodium. He was admitted and underwent an extra HD session and nephrology reduced his EDW goal. Volume improved w/ HD. EKG showed rate controlled afib. 2D echo showed LVEF 35-40%, low normal RV function,severe pulm HTN. Severe LAE, mod RAE, mod MR, severe TR. RV TAPSE 1.6, RV s' 10. General cardiology was consulted and he was evaluated by Dr. Harl Bowie. He elected to stop hydral and imdur and started him on Entresto 24-26 bid. He was continued on coreg 12.5 bid and advised to f/u in [facility] for further management and med titration.  On 10/06/19 he had DC-CV with restoration of NSR.---> today 10/20/19 in A flutter   Today he returns for HF follow up. Overall feeling fine. Continues to go to HD T/Thur/Sat. Denies SOB/PND/Orthopnea. No BRBPR.  Appetite ok. No fever or chills. Dry Weight 68 Kg. Taking all medications. Continue to work for [employer]. Live alone. Uses [employer]. Continues to smoke a few cigarettes per day.   01/17/12 ECHO 55%-60%  11/20/12 ECHO EF 40% Peak PA pressure 56 11/30/13 ECHO 40-45%  12/2015: ECHO 40%.  07/2017: EF 40-45% 05/2018: EF 25-30% 08/2019: LVEF 35-40%, low normal RV function,severe pulm HTN.  SH: Lives alone. Uses [employer]. Smokes 8 cigarettes per day. Occasional ETOH and  no drugs  FH: Mother deceased: liver failure, no CAD        Father deceased: CAD, MI  Review of systems complete and found to be negative unless listed in HPI.   Past Medical History:  Diagnosis Date  . Blood dyscrasia   . Blood transfusion    "I've had a bunch of them"  . CHF (congestive heart failure) ([place])    followed by hf clinic  . Chronic kidney disease (CKD), stage III (moderate)   . Dialysis patient ([place]) 04/2019   AV fistula (right arm)  . Gout   . IDPOEUMP(536.1)    "probably weekly" (01/13/2014)  . Legally blind   . Shortness of breath dyspnea   . Sickle cell disease, type SS ([place])   . Swelling abdomen   . Swelling of extremity, left   . Swelling of extremity, right     Current Outpatient Medications  Medication Sig Dispense Refill  . acetaminophen (TYLENOL) 500 MG tablet Take 1,000 mg by mouth every 6 (six) hours as needed for moderate pain or headache.    . allopurinol (ZYLOPRIM) 100 MG tablet Take 100 mg by mouth 2 (two) times daily.    . carvedilol (COREG) 12.5 MG tablet TAKE 1 TABLET BY MOUTH 2 (TWO) TIMES DAILY WITH A MEAL. 180 tablet 2  . diphenhydramine-acetaminophen (TYLENOL PM) 25-500 MG TABS tablet Take 2 tablets by mouth at bedtime.    Marland Kitchen ELIQUIS 5 MG TABS tablet TAKE 1 TABLET BY MOUTH TWICE A DAY 60 tablet 5  . ferric citrate (AURYXIA) 1 GM 210 MG(Fe) tablet Take 210 mg by mouth See admin instructions. Take 210 mg with each meal and snack    . folic acid (FOLVITE) 1 MG tablet TAKE 1 TABLET BY MOUTH EVERY DAY 90 tablet 3  . gabapentin (NEURONTIN) 100 MG capsule TAKE 1 CAPSULE BY MOUTH AT BEDTIME. 30 capsule 3  . hydroxyurea (HYDREA) 500 MG capsule TAKE ONE CAPSULE BY MOUTH EVERY DAY WITH FOOD TO MINIMIZE GI SIDE EFFECTS 90 capsule 3  . lidocaine-prilocaine (EMLA) cream Apply 1 application topically daily as needed (port access).     . Oxycodone HCl 10 MG TABS Take 1 tablet (10 mg total) by mouth every 4 (four) hours as needed. 60 tablet 0  . sildenafil  (VIAGRA) 50 MG tablet Take 1 tablet (50 mg total) by mouth daily as needed for erectile dysfunction. Do not take same day as Isosorbide 10 tablet 0  . torsemide (DEMADEX) 100 MG tablet Take 1 tablet (100 mg total) by mouth daily. Pt needs appointment for future refills. Call [phone] to schedule 30 tablet 1   No current facility-administered medications for this encounter.   Vitals:   10/20/19 1505  BP: 128/64  Pulse: (!) 117  SpO2: 98%  Weight: 66.9 kg (147 lb 6.4 oz)     Wt Readings from Last 3 Encounters:  10/20/19 66.9 kg (147 lb 6.4 oz)  10/01/19 67.1 kg (148 lb)  09/29/19 66.7 kg (147 lb)  PHYSICAL EXAM: General:  Walked in the clinic.Marland Kitchen No resp difficulty HEENT: normal Neck: supple. no JVD. Carotids 2+ bilat; no bruits. No lymphadenopathy or thryomegaly appreciated. Cor: PMI nondisplaced. Regular rate & rhythm. No rubs, gallops or murmurs. Lungs: clear Abdomen: soft, nontender, nondistended. No hepatosplenomegaly. No bruits or masses. Good bowel sounds. Extremities: no cyanosis, clubbing, rash, edema. RUE AVF Neuro: alert & orientedx3, cranial nerves grossly intact. moves all 4 extremities w/o difficulty. Affect pleasant  EKG: A flutter 114 bpm   ASSESSMENT & PLAN:  1. Chronic Combined Heart Failure:  NICM. Cath 05/2011 without significant coronary disease.  ECHO 12/2015 40%. Echo 2/19 EF 40-45%. Echo 05/2018: EF 25-30%. Echo 3/21 LVEF 35-40%, low normal RV function,severe pulm HTN. Severe LAE, mod RAE, mod MR, severe TR. RV TAPSE 1.6, RV s' 10 NYHA II. Volume status managed with HD 3 times a week.  - Stop torsemide on HD    -Continue coreg 12.5 BID. - off Hydralazine/imdur.  - Had been on entresto but he is not longer on it? Hold off for now. - It is suspected his pulmonary HTN is primarily 2/2 left sided heart disease (see hospital notes).  2. HTN: - BP stable.  3  Sickle Cell:   - Follows closely with Sickle Cell Clinic.  4. Former smoker:   - Stopped 11/2015. -  Discussed smoking cessation.   5. ESRD - On HD M-W-F  6. Persistent Atrial Fibrillation -  s/p DCCV 05/2018 - S/P DCCV 10/06/19 NSR - Back in A fib today.   - This patients CHA2DS2-VASc is at least 3.  - In A flutter today. Refer to A fib clinic. May need to put him back on amiodarone.  - On Eliquis 5 mg BID  7. H/o GI bleeding: - Prior EGD in 2019 showed nonbleeding gastritis, Colonoscopy showed small internal hemorrhoids, otherwise normal.  No recent bleeding issues.   Refer to A fib clinic. Follow up in 8 weeks.    Patrick Grinder, NP  10/20/2019

## 2019-10-20 NOTE — Patient Instructions (Signed)
STOP Torsemide  You have been referred to A-Fib Clinic (Friday 10/23/2019 @ 1130) [place], [place] [address] [phone]   Your physician recommends that you schedule a follow-up appointment in: 3 months with Dr Haroldine Laws  Do the following things EVERYDAY: 1) Weigh yourself in the morning before breakfast. Write it down and keep it in a log. 2) Take your medicines as prescribed 3) Eat low salt foods--Limit salt (sodium) to 2000 mg per day.  4) Stay as active as you can everyday 5) Limit all fluids for the day to less than 2 liters  At the [facility], you and your health needs are our priority. As part of our continuing mission to provide you with exceptional heart care, we have created designated Provider Care Teams. These Care Teams include your primary Cardiologist (physician) and Advanced Practice Providers (APPs- Physician Assistants and Nurse Practitioners) who all work together to provide you with the care you need, when you need it.   You may see any of the following providers on your designated Care Team at your next follow up: Marland Kitchen Dr Glori Bickers . Dr Loralie Champagne . [name], NP . Lyda Jester, PA . Audry Riles, PharmD   Please be sure to bring in all your medications bottles to every appointment.

## 2019-10-21 ENCOUNTER — Other Ambulatory Visit: Payer: Self-pay | Admitting: Family Medicine

## 2019-10-21 ENCOUNTER — Telehealth: Payer: Self-pay | Admitting: Family Medicine

## 2019-10-21 DIAGNOSIS — M79601 Pain in right arm: Secondary | ICD-10-CM

## 2019-10-21 MED ORDER — OXYCODONE HCL 10 MG PO TABS: 10.0000 mg | ORAL_TABLET | ORAL | 0 refills | Status: DC | PRN

## 2019-10-21 NOTE — Progress Notes (Signed)
Reviewed PDMP substance reporting system prior to prescribing opiate medications. No inconsistencies noted.  Meds ordered this encounter  Medications   Oxycodone HCl 10 MG TABS    Sig: Take 1 tablet (10 mg total) by mouth every 4 (four) hours as needed.    Dispense:  60 tablet    Refill:  0    Order Specific Question:   Supervising Provider    Answer:   JEGEDE, OLUGBEMIGA E [[id number]]   Patrick Fuster Moore [name]  APRN, MSN, FNP-C [facility] [address]  [place], [place] [address] [phone]  

## 2019-10-22 NOTE — Telephone Encounter (Signed)
Done

## 2019-10-23 ENCOUNTER — Encounter (HOSPITAL_COMMUNITY): Payer: Self-pay | Admitting: Nurse Practitioner

## 2019-10-23 ENCOUNTER — Other Ambulatory Visit: Payer: Self-pay

## 2019-10-23 ENCOUNTER — Ambulatory Visit (HOSPITAL_COMMUNITY)
Admission: RE | Admit: 2019-10-23 | Discharge: 2019-10-23 | Disposition: A | Discharge status: Home / Self Care | Payer: PPO | Source: Ambulatory Visit | Attending: Nurse Practitioner | Admitting: Nurse Practitioner

## 2019-10-23 VITALS — BP 100/64 | HR 118 | Ht 71.0 in | Wt 148.4 lb

## 2019-10-23 DIAGNOSIS — I4892 Unspecified atrial flutter: Secondary | ICD-10-CM | POA: Insufficient documentation

## 2019-10-23 DIAGNOSIS — Z87891 Personal history of nicotine dependence: Secondary | ICD-10-CM | POA: Insufficient documentation

## 2019-10-23 DIAGNOSIS — I4819 Other persistent atrial fibrillation: Secondary | ICD-10-CM

## 2019-10-23 DIAGNOSIS — D571 Sickle-cell disease without crisis: Secondary | ICD-10-CM | POA: Insufficient documentation

## 2019-10-23 DIAGNOSIS — Z7901 Long term (current) use of anticoagulants: Secondary | ICD-10-CM | POA: Insufficient documentation

## 2019-10-23 DIAGNOSIS — Z79899 Other long term (current) drug therapy: Secondary | ICD-10-CM | POA: Insufficient documentation

## 2019-10-23 DIAGNOSIS — I5022 Chronic systolic (congestive) heart failure: Secondary | ICD-10-CM | POA: Insufficient documentation

## 2019-10-23 DIAGNOSIS — H548 Legal blindness, as defined in USA: Secondary | ICD-10-CM | POA: Diagnosis not present

## 2019-10-23 DIAGNOSIS — D6869 Other thrombophilia: Secondary | ICD-10-CM

## 2019-10-23 DIAGNOSIS — N186 End stage renal disease: Secondary | ICD-10-CM | POA: Insufficient documentation

## 2019-10-23 DIAGNOSIS — M109 Gout, unspecified: Secondary | ICD-10-CM | POA: Diagnosis not present

## 2019-10-23 NOTE — Progress Notes (Signed)
Primary Care Physician: [name], MD Referring Physician: [name], RN Cardiologist: Dr. Ladon Applebaum is a 64 y.o. male with a very complex medical history, sickle cell anemia, chronic systolic HF, ESRD,on dialysis,  legally blind, PAF, prior upper GI bleed. He was hospitalized in December 2019, loaded on amiodarone and per D/C instructions was to go home on 200 mg amiodarone bid. However, it appears pt did not receive prescription as when he returned for f/u in HF clinic, 06/18/19, he was in [place] but was not on amiodarone. Most of the EKG's in 2020 show afib or atrial flutter. He had a successful  cardioversion 10/06/19 with Dr. Haroldine Laws, 10/06/19, but on f/u with [facility], NP,10/20/19, he was in atrial flutter at 114 bpm and was referred here.   EKg today show atrial flutter at 118 bpm. Pt is asymptomatic. BP is soft today at 100/64, he states that he had too much fluid pulled off on last dialysis. It ususally runs at 220'U systolic. He is on eliquis 5 mg bid with a CHA2DS2VASc score of 2.     Today, he denies symptoms of palpitations, chest pain, shortness of breath, orthopnea, PND, lower extremity edema, dizziness, presyncope, syncope, or neurologic sequela. The patient is tolerating medications without difficulties and is otherwise without complaint today.   Past Medical History:  Diagnosis Date  . Blood dyscrasia   . Blood transfusion    "I've had a bunch of them"  . CHF (congestive heart failure) ([place])    followed by hf clinic  . Chronic kidney disease (CKD), stage III (moderate)   . Dialysis patient ([place]) 04/2019   AV fistula (right arm)  . Gout   . RKYHCWCB(762.8)    "probably weekly" (01/13/2014)  . Legally blind   . Shortness of breath dyspnea   . Sickle cell disease, type SS ([place])   . Swelling abdomen   . Swelling of extremity, left   . Swelling of extremity, right    Past Surgical History:  Procedure Laterality Date  . BASCILIC VEIN  TRANSPOSITION Right 04/12/2014   Procedure: BASILIC VEIN TRANSPOSITION;  Surgeon: Elam Dutch, MD;  Location: [place];  Service: Vascular;  Laterality: Right;  . CARDIAC CATHETERIZATION  05/2011  . CARDIOVERSION N/A 06/05/2018   Procedure: CARDIOVERSION;  Surgeon: Jolaine Artist, MD;  Location: [facility] ENDOSCOPY;  Service: Cardiovascular;  Laterality: N/A;  . CARDIOVERSION N/A 10/06/2019   Procedure: CARDIOVERSION;  Surgeon: Jolaine Artist, MD;  Location: [place];  Service: Cardiovascular;  Laterality: N/A;  . COLONOSCOPY WITH PROPOFOL N/A 09/24/2017   Procedure: COLONOSCOPY WITH PROPOFOL;  Surgeon: Jackquline Denmark, MD;  Location: [facility] ENDOSCOPY;  Service: Endoscopy;  Laterality: N/A;  . ESOPHAGOGASTRODUODENOSCOPY N/A 09/19/2017   Procedure: ESOPHAGOGASTRODUODENOSCOPY (EGD);  Surgeon: Jackquline Denmark, MD;  Location: [facility] ENDOSCOPY;  Service: Endoscopy;  Laterality: N/A;  . LEFT AND RIGHT HEART CATHETERIZATION WITH CORONARY ANGIOGRAM N/A 06/11/2011   Procedure: LEFT AND RIGHT HEART CATHETERIZATION WITH CORONARY ANGIOGRAM;  Surgeon: Larey Dresser, MD;  Location: [facility] CATH LAB;  Service: Cardiovascular;  Laterality: N/A;  . TEE WITHOUT CARDIOVERSION N/A 12/27/2015   Procedure: TRANSESOPHAGEAL ECHOCARDIOGRAM (TEE);  Surgeon: Sueanne Margarita, MD;  Location: [facility] ENDOSCOPY;  Service: Cardiovascular;  Laterality: N/A;    Current Outpatient Medications  Medication Sig Dispense Refill  . acetaminophen (TYLENOL) 500 MG tablet Take 1,000 mg by mouth every 6 (six) hours as needed for moderate pain or headache.    . allopurinol (  ZYLOPRIM) 100 MG tablet Take 100 mg by mouth 2 (two) times daily.    . carvedilol (COREG) 12.5 MG tablet TAKE 1 TABLET BY MOUTH 2 (TWO) TIMES DAILY WITH A MEAL. 180 tablet 2  . diphenhydramine-acetaminophen (TYLENOL PM) 25-500 MG TABS tablet Take 2 tablets by mouth at bedtime.    Marland Kitchen ELIQUIS 5 MG TABS tablet TAKE 1 TABLET BY MOUTH TWICE A DAY 60 tablet 5  . ferric citrate (AURYXIA) 1 GM  210 MG(Fe) tablet Take 210 mg by mouth See admin instructions. Take 210 mg with each meal and snack    . folic acid (FOLVITE) 1 MG tablet TAKE 1 TABLET BY MOUTH EVERY DAY 90 tablet 3  . gabapentin (NEURONTIN) 100 MG capsule TAKE 1 CAPSULE BY MOUTH AT BEDTIME. 30 capsule 3  . hydroxyurea (HYDREA) 500 MG capsule TAKE ONE CAPSULE BY MOUTH EVERY DAY WITH FOOD TO MINIMIZE GI SIDE EFFECTS 90 capsule 3  . lidocaine-prilocaine (EMLA) cream Apply 1 application topically daily as needed (port access).     . Oxycodone HCl 10 MG TABS Take 1 tablet (10 mg total) by mouth every 4 (four) hours as needed. 60 tablet 0  . sildenafil (VIAGRA) 50 MG tablet Take 1 tablet (50 mg total) by mouth daily as needed for erectile dysfunction. Do not take same day as Isosorbide 10 tablet 0   No current facility-administered medications for this encounter.    No Known Allergies  Social History   Socioeconomic History  . Marital status: Divorced    Spouse name: Not on file  . Number of children: Not on file  . Years of education: Not on file  . Highest education level: Not on file  Occupational History  . Not on file  Tobacco Use  . Smoking status: Former Smoker    Packs/day: 0.25    Years: 41.00    Pack years: 10.25    Types: Cigarettes    Quit date: 12/08/2015    Years since quitting: 3.8  . Smokeless tobacco: Never Used  Substance and Sexual Activity  . Alcohol use: Yes    Alcohol/week: 3.0 standard drinks    Types: 1 Glasses of wine, 2 Cans of beer per week    Comment: occasional  . Drug use: No  . Sexual activity: Not Currently  Other Topics Concern  . Not on file  Social History Narrative  . Not on file   Social Determinants of Health   Financial Resource Strain:   . Difficulty of Paying Living Expenses:   Food Insecurity:   . Worried About Charity fundraiser in the Last Year:   . Arboriculturist in the Last Year:   Transportation Needs: No Transportation Needs  . Lack of Transportation  (Medical): No  . Lack of Transportation (Non-Medical): No  Physical Activity: Sufficiently Active  . Days of Exercise per Week: 5 days  . Minutes of Exercise per Session: 90 min  Stress:   . Feeling of Stress :   Social Connections:   . Frequency of Communication with Friends and Family:   . Frequency of Social Gatherings with Friends and Family:   . Attends Religious Services:   . Active Member of Clubs or Organizations:   . Attends Archivist Meetings:   Marland Kitchen Marital Status:   Intimate Partner Violence:   . Fear of Current or Ex-Partner:   . Emotionally Abused:   Marland Kitchen Physically Abused:   . Sexually Abused:  Family History  Problem Relation Age of Onset  . Alcohol abuse Mother   . Liver disease Mother   . Cirrhosis Mother   . Heart attack Mother     ROS- All systems are reviewed and negative except as per the HPI above  Physical Exam: Vitals:   10/23/19 1131  BP: 100/64  Pulse: (!) 118  Weight: 67.3 kg  Height: 5\' 11"  (1.803 m)   Wt Readings from Last 3 Encounters:  10/23/19 67.3 kg  10/20/19 66.9 kg  10/01/19 67.1 kg    Labs: Lab Results  Component Value Date   NA 134 (L) 10/01/2019   K 4.3 10/01/2019   CL 96 (L) 10/01/2019   CO2 26 10/01/2019   GLUCOSE 112 (H) 10/01/2019   BUN 37 (H) 10/01/2019   CREATININE 5.21 (H) 10/01/2019   CALCIUM 8.8 (L) 10/01/2019   PHOS 5.8 (H) 06/06/2018   MG 2.1 06/04/2018   Lab Results  Component Value Date   INR 1.4 (H) 09/11/2019   Lab Results  Component Value Date   CHOL 114 (L) 02/17/2015   HDL 31 (L) 02/17/2015   LDLCALC 68 02/17/2015   TRIG 76 02/17/2015     GEN- The patient is well appearing, alert and oriented x 3 today.   Head- normocephalic, atraumatic Eyes-  Sclera clear, conjunctiva pink Ears- hearing intact Oropharynx- clear Neck- supple, no JVP Lymph- no cervical lymphadenopathy Lungs- Clear to ausculation bilaterally, normal work of breathing Heart- irregular rate and rhythm, no  murmurs, rubs or gallops, PMI not laterally displaced GI- soft, NT, ND, + BS Extremities- no clubbing, cyanosis, or edema MS- no significant deformity or atrophy Skin- no rash or lesion Psych- euthymic mood, full affect Neuro- strength and sensation are intact  EKG-atrial  flutter at 118 bpm   CXR- 09/11/19-FINDINGS: The heart size is enlarged. There are prominent interstitial lung markings. Aortic calcifications are noted. There is no pneumothorax. No large pleural effusion. There is no focal infiltrate. No acute osseous abnormality.  Echo-09/12/19-IMPRESSIONS  1. Left ventricular ejection fraction, by estimation, is 35 to 40%. The  left ventricle has moderately decreased function. The left ventricle  demonstrates global hypokinesis. Left ventricular diastolic function could  not be evaluated.  2. Right ventricular systolic function is low normal. The right  ventricular size is moderately enlarged. There is severely elevated  pulmonary artery systolic pressure. The estimated right ventricular  systolic pressure is 87.5 mmHg.  3. Left atrial size was severely dilated.  4. Right atrial size was moderately dilated.  5. The mitral valve is grossly normal. Moderate mitral valve  regurgitation. No evidence of mitral stenosis.  6. Tricuspid valve regurgitation is severe.  7. The aortic valve is grossly normal. Aortic valve regurgitation is not  visualized. Mild aortic valve sclerosis is present, with no evidence of  aortic valve stenosis.    Assessment and Plan: 1. Persistent  Afib/flutter  It appears that pt spent the majority of 2020, by reviewed EKG's in Epic in  afib /flutter.  Successful cardioversion 10/06/19  but with ERAF by f/u 5/4 It appears that he  was started on amiodarone during nhospitalization 06/05/2018 and in d/c summary he was to be discharged on 200 mg bid , but his d/c med list did not reflect  amiodarone . On d/c f/u in HF clinic, 06/17/20, he was in [place] and  there was no indication of amio on his med list and no mention by the provider that he was not taking amiodarone.  I discussed with the Pt risk vrs benefit of amiodarone He was concerned re possible corneal deposits as he is legally blind He also had interstitial changes on last 2 CXR's with h/o Pulmonary HTN Liver panel in March  showed elevated bilirubin and I do not see a recent TSH I will discuss with Dr. Rayann Heman to see if he feels amiodarone should be restarted  May need baseline PFT's to see if lung status is acceptable  to start , as well as baseline hepatic panel and  TSH  2. CHA2DS2VASc score of 2 Continue eliquis  3. CHF Per HF clinic  4. ESRD On dialysis   I will further discuss with pt after discussion with Dr. Rayann Heman next week   Geroge Baseman. Seraj Dunnam, [facility] [address] [place], [place] [address] [phone]

## 2019-10-26 ENCOUNTER — Ambulatory Visit: Payer: PPO | Attending: Internal Medicine

## 2019-10-26 DIAGNOSIS — Z23 Encounter for immunization: Secondary | ICD-10-CM

## 2019-10-26 NOTE — Progress Notes (Signed)
   Covid-19 Vaccination Clinic  Name:  Patrick [name]    MRN: [id number] DOB: 15-Nov-1955  10/26/2019  [name] was observed post Covid-19 immunization for 15 minutes without incident. He was provided with Vaccine Information Sheet and instruction to access the V-Safe system.   [name] was instructed to call 911 with any severe reactions post vaccine: Marland Kitchen Difficulty breathing  . Swelling of face and throat  . A fast heartbeat  . A bad rash all over body  . Dizziness and weakness   Immunizations Administered    Name Date Dose VIS Date Route   Pfizer COVID-19 Vaccine 10/26/2019  9:51 AM 0.3 mL 08/12/2018 Intramuscular   Manufacturer: [place]   Lot: [id number]   [place]: [id number]

## 2019-10-27 ENCOUNTER — Other Ambulatory Visit (HOSPITAL_COMMUNITY): Payer: Self-pay | Admitting: *Deleted

## 2019-10-27 DIAGNOSIS — I4819 Other persistent atrial fibrillation: Secondary | ICD-10-CM

## 2019-10-27 NOTE — Addendum Note (Signed)
Encounter addended by: Sherran Needs, NP on: 10/27/2019 4:48 PM  Actions taken: Clinical Note Signed

## 2019-10-27 NOTE — Progress Notes (Signed)
Primary Care Physician: [name], MD Referring Physician: [name], RN Cardiologist: Dr. Ladon Applebaum is a 64 y.o. male with a very complex medical history, sickle cell anemia, chronic systolic HF, ESRD,on dialysis,  legally blind, PAF, prior upper GI bleed. He was hospitalized in December 2019, loaded on amiodarone and per D/C instructions was to go home on 200 mg amiodarone bid. However, it appears pt did not receive prescription as when he returned for f/u in HF clinic, 06/18/19, he was in [place] but was not on amiodarone. Most of the EKG's in 2020 show afib or atrial flutter. He had a successful  cardioversion 10/06/19 with Dr. Haroldine Laws, 10/06/19, but on f/u with [facility], NP,10/20/19, he was in atrial flutter at 114 bpm and was referred here.   EKg today show atrial flutter at 118 bpm. Pt is asymptomatic. BP is soft today at 100/64, he states that he had too much fluid pulled off on last dialysis. It ususally runs at 591'M systolic. He is on eliquis 5 mg bid with a CHA2DS2VASc score of 2.     Today, he denies symptoms of palpitations, chest pain, shortness of breath, orthopnea, PND, lower extremity edema, dizziness, presyncope, syncope, or neurologic sequela. The patient is tolerating medications without difficulties and is otherwise without complaint today.   Past Medical History:  Diagnosis Date  . Blood dyscrasia   . Blood transfusion    "I've had a bunch of them"  . CHF (congestive heart failure) ([place])    followed by hf clinic  . Chronic kidney disease (CKD), stage III (moderate)   . Dialysis patient ([place]) 04/2019   AV fistula (right arm)  . Gout   . BWGYKZLD(357.0)    "probably weekly" (01/13/2014)  . Legally blind   . Shortness of breath dyspnea   . Sickle cell disease, type SS ([place])   . Swelling abdomen   . Swelling of extremity, left   . Swelling of extremity, right    Past Surgical History:  Procedure Laterality Date  . BASCILIC VEIN  TRANSPOSITION Right 04/12/2014   Procedure: BASILIC VEIN TRANSPOSITION;  Surgeon: Elam Dutch, MD;  Location: [place];  Service: Vascular;  Laterality: Right;  . CARDIAC CATHETERIZATION  05/2011  . CARDIOVERSION N/A 06/05/2018   Procedure: CARDIOVERSION;  Surgeon: Jolaine Artist, MD;  Location: [facility] ENDOSCOPY;  Service: Cardiovascular;  Laterality: N/A;  . CARDIOVERSION N/A 10/06/2019   Procedure: CARDIOVERSION;  Surgeon: Jolaine Artist, MD;  Location: [place];  Service: Cardiovascular;  Laterality: N/A;  . COLONOSCOPY WITH PROPOFOL N/A 09/24/2017   Procedure: COLONOSCOPY WITH PROPOFOL;  Surgeon: Jackquline Denmark, MD;  Location[facility] ENDOSCOPY;  Service: Endoscopy;  Laterality: N/A;  . ESOPHAGOGASTRODUODENOSCOPY N/A 09/19/2017   Procedure: ESOPHAGOGASTRODUODENOSCOPY (EGD);  Surgeon: Jackquline Denmark, MD;  Location: [facility] ENDOSCOPY;  Service: Endoscopy;  Laterality: N/A;  . LEFT AND RIGHT HEART CATHETERIZATION WITH CORONARY ANGIOGRAM N/A 06/11/2011   Procedure: LEFT AND RIGHT HEART CATHETERIZATION WITH CORONARY ANGIOGRAM;  Surgeon: Larey Dresser, MD;  Location[facility] C[facility] LAB;  Service: Cardiovascular;  Laterality: N/A;  . TEE WITHOUT CARDIOVERSION N/A 12/27/2015   Procedure: TRANSESOPHAGEAL ECHOCARDIOGRAM (TEE);  Surgeon: Sueanne Margarita, MD;  Location[facility] ENDOSCOPY;  Service: Cardiovascular;  Laterality: N/A;    Current Outpatient Medications  Medication Sig Dispense Refill  . acetaminophen (TYLENOL) 500 MG tablet Take 1,000 mg by mouth every 6 (six) hours as needed for moderate pain or headache.    . allopurinol (  ZYLOPRIM) 100 MG tablet Take 100 mg by mouth 2 (two) times daily.    . carvedilol (COREG) 12.5 MG tablet TAKE 1 TABLET BY MOUTH 2 (TWO) TIMES DAILY WITH A MEAL. 180 tablet 2  . diphenhydramine-acetaminophen (TYLENOL PM) 25-500 MG TABS tablet Take 2 tablets by mouth at bedtime.    Marland Kitchen ELIQUIS 5 MG TABS tablet TAKE 1 TABLET BY MOUTH TWICE A DAY 60 tablet 5  . ferric citrate (AURYXIA) 1 GM  210 MG(Fe) tablet Take 210 mg by mouth See admin instructions. Take 210 mg with each meal and snack    . folic acid (FOLVITE) 1 MG tablet TAKE 1 TABLET BY MOUTH EVERY DAY 90 tablet 3  . gabapentin (NEURONTIN) 100 MG capsule TAKE 1 CAPSULE BY MOUTH AT BEDTIME. 30 capsule 3  . hydroxyurea (HYDREA) 500 MG capsule TAKE ONE CAPSULE BY MOUTH EVERY DAY WITH FOOD TO MINIMIZE GI SIDE EFFECTS 90 capsule 3  . lidocaine-prilocaine (EMLA) cream Apply 1 application topically daily as needed (port access).     . Oxycodone HCl 10 MG TABS Take 1 tablet (10 mg total) by mouth every 4 (four) hours as needed. 60 tablet 0  . sildenafil (VIAGRA) 50 MG tablet Take 1 tablet (50 mg total) by mouth daily as needed for erectile dysfunction. Do not take same day as Isosorbide 10 tablet 0   No current facility-administered medications for this encounter.    No Known Allergies  Social History   Socioeconomic History  . Marital status: Divorced    Spouse name: Not on file  . Number of children: Not on file  . Years of education: Not on file  . Highest education level: Not on file  Occupational History  . Not on file  Tobacco Use  . Smoking status: Former Smoker    Packs/day: 0.25    Years: 41.00    Pack years: 10.25    Types: Cigarettes    Quit date: 12/08/2015    Years since quitting: 3.8  . Smokeless tobacco: Never Used  Substance and Sexual Activity  . Alcohol use: Yes    Alcohol/week: 3.0 standard drinks    Types: 1 Glasses of wine, 2 Cans of beer per week    Comment: occasional  . Drug use: No  . Sexual activity: Not Currently  Other Topics Concern  . Not on file  Social History Narrative  . Not on file   Social Determinants of Health   Financial Resource Strain:   . Difficulty of Paying Living Expenses:   Food Insecurity:   . Worried About Charity fundraiser in the Last Year:   . Arboriculturist in the Last Year:   Transportation Needs: No Transportation Needs  . Lack of Transportation  (Medical): No  . Lack of Transportation (Non-Medical): No  Physical Activity: Sufficiently Active  . Days of Exercise per Week: 5 days  . Minutes of Exercise per Session: 90 min  Stress:   . Feeling of Stress :   Social Connections:   . Frequency of Communication with Friends and Family:   . Frequency of Social Gatherings with Friends and Family:   . Attends Religious Services:   . Active Member of Clubs or Organizations:   . Attends Archivist Meetings:   Marland Kitchen Marital Status:   Intimate Partner Violence:   . Fear of Current or Ex-Partner:   . Emotionally Abused:   Marland Kitchen Physically Abused:   . Sexually Abused:  Family History  Problem Relation Age of Onset  . Alcohol abuse Mother   . Liver disease Mother   . Cirrhosis Mother   . Heart attack Mother     ROS- All systems are reviewed and negative except as per the HPI above  Physical Exam: Vitals:   10/23/19 1131  BP: 100/64  Pulse: (!) 118  Weight: 67.3 kg  Height: 5\' 11"  (1.803 m)   Wt Readings from Last 3 Encounters:  10/23/19 67.3 kg  10/20/19 66.9 kg  10/01/19 67.1 kg    Labs: Lab Results  Component Value Date   NA 134 (L) 10/01/2019   K 4.3 10/01/2019   CL 96 (L) 10/01/2019   CO2 26 10/01/2019   GLUCOSE 112 (H) 10/01/2019   BUN 37 (H) 10/01/2019   CREATININE 5.21 (H) 10/01/2019   CALCIUM 8.8 (L) 10/01/2019   PHOS 5.8 (H) 06/06/2018   MG 2.1 06/04/2018   Lab Results  Component Value Date   INR 1.4 (H) 09/11/2019   Lab Results  Component Value Date   CHOL 114 (L) 02/17/2015   HDL 31 (L) 02/17/2015   LDLCALC 68 02/17/2015   TRIG 76 02/17/2015     GEN- The patient is well appearing, alert and oriented x 3 today.   Head- normocephalic, atraumatic Eyes-  Sclera clear, conjunctiva pink Ears- hearing intact Oropharynx- clear Neck- supple, no JVP Lymph- no cervical lymphadenopathy Lungs- Clear to ausculation bilaterally, normal work of breathing Heart- irregular rate and rhythm, no  murmurs, rubs or gallops, PMI not laterally displaced GI- soft, NT, ND, + BS Extremities- no clubbing, cyanosis, or edema MS- no significant deformity or atrophy Skin- no rash or lesion Psych- euthymic mood, full affect Neuro- strength and sensation are intact  EKG-atrial  flutter at 118 bpm   CXR- 09/11/19-FINDINGS: The heart size is enlarged. There are prominent interstitial lung markings. Aortic calcifications are noted. There is no pneumothorax. No large pleural effusion. There is no focal infiltrate. No acute osseous abnormality.  Echo-09/12/19-IMPRESSIONS  1. Left ventricular ejection fraction, by estimation, is 35 to 40%. The  left ventricle has moderately decreased function. The left ventricle  demonstrates global hypokinesis. Left ventricular diastolic function could  not be evaluated.  2. Right ventricular systolic function is low normal. The right  ventricular size is moderately enlarged. There is severely elevated  pulmonary artery systolic pressure. The estimated right ventricular  systolic pressure is 33.8 mmHg.  3. Left atrial size was severely dilated.  4. Right atrial size was moderately dilated.  5. The mitral valve is grossly normal. Moderate mitral valve  regurgitation. No evidence of mitral stenosis.  6. Tricuspid valve regurgitation is severe.  7. The aortic valve is grossly normal. Aortic valve regurgitation is not  visualized. Mild aortic valve sclerosis is present, with no evidence of  aortic valve stenosis.    Assessment and Plan: 1. Persistent  Afib/flutter  It appears that pt  May have spent the majority of 2020, by reviewed EKG's in Epic in  afib /flutter.  Successful cardioversion 10/06/19  but with ERAF by f/u 5/4 It appears that he  was started on amiodarone during  hospitalization 06/05/2018 and in d/c summary he was to be discharged on 200 mg bid , but his d/c med list did not reflect  rx for this . On d/c f/u in HF clinic, 06/17/20, he was  in [place] and there was no indication of amio on his med list and no mention by the provider  that he was not taking amiodarone.  I discussed with the Pt risk vrs benefit of amiodarone He was concerned re possible corneal deposits as he is legally blind, I answered questions re this and usually corneal deposits are not a hard stop for discontinuation of  amio  He also had interstitial changes on last CXR s with h/o Pulmonary HTN Liver panel in April  showed normal liver enzymes with minimally  elevated bilirubin and I do not see a recent TSH I will discuss with Dr. Rayann Heman to see if he feels amiodarone should be restarted  May need baseline PFT's to see if lung status is acceptable  to start as well as baseline hepatic panel and  TSH  2. CHA2DS2VASc score of 2 Continue eliquis  3. CHF Per HF clinic  4. ESRD On dialysis   I will further discuss with pt after discussion with Dr. Rayann Heman next week   5/11-addendum- Dr. Rayann Heman feels that amiodarone is his only option. I discussed with pt and he is willing to try this to restore SR. I will obtain PFT's and then start drug at 200 mg bid with anticipation of cardioversion in one month. Pt is in agreement. I will repeat cmet and TSH at the  one week post amio start here.   Geroge Baseman Kensly Bowmer, [facility] [address] [place], [place] [address] [phone]

## 2019-11-10 ENCOUNTER — Telehealth: Payer: Self-pay | Admitting: Family Medicine

## 2019-11-11 ENCOUNTER — Other Ambulatory Visit: Payer: Self-pay | Admitting: Family Medicine

## 2019-11-11 ENCOUNTER — Other Ambulatory Visit (HOSPITAL_COMMUNITY): Payer: Self-pay | Admitting: Internal Medicine

## 2019-11-11 MED ORDER — SILDENAFIL CITRATE 50 MG PO TABS: 50.0000 mg | ORAL_TABLET | Freq: Every day | ORAL | 0 refills | Status: DC | PRN

## 2019-11-11 NOTE — Telephone Encounter (Signed)
Done

## 2019-11-11 NOTE — Progress Notes (Signed)
Meds ordered this encounter  Medications  . sildenafil (VIAGRA) 50 MG tablet    Sig: Take 1 tablet (50 mg total) by mouth daily as needed for erectile dysfunction. Do not take same day as Isosorbide    Dispense:  10 tablet    Refill:  0    Order Specific Question:   Supervising Provider    Answer:   [name] [[id number]]    [name]  APRN, MSN, FNP-C [facility] [address] [place], [place] [address] [phone]

## 2019-11-16 DIAGNOSIS — N186 End stage renal disease: Secondary | ICD-10-CM | POA: Diagnosis not present

## 2019-11-16 DIAGNOSIS — I158 Other secondary hypertension: Secondary | ICD-10-CM | POA: Diagnosis not present

## 2019-11-16 DIAGNOSIS — Z992 Dependence on renal dialysis: Secondary | ICD-10-CM | POA: Diagnosis not present

## 2019-11-17 DIAGNOSIS — E876 Hypokalemia: Secondary | ICD-10-CM | POA: Diagnosis not present

## 2019-11-17 DIAGNOSIS — N2581 Secondary hyperparathyroidism of renal origin: Secondary | ICD-10-CM | POA: Diagnosis not present

## 2019-11-17 DIAGNOSIS — D509 Iron deficiency anemia, unspecified: Secondary | ICD-10-CM | POA: Diagnosis not present

## 2019-11-17 DIAGNOSIS — Z992 Dependence on renal dialysis: Secondary | ICD-10-CM | POA: Diagnosis not present

## 2019-11-17 DIAGNOSIS — D689 Coagulation defect, unspecified: Secondary | ICD-10-CM | POA: Diagnosis not present

## 2019-11-17 DIAGNOSIS — N186 End stage renal disease: Secondary | ICD-10-CM | POA: Diagnosis not present

## 2019-11-17 DIAGNOSIS — D631 Anemia in chronic kidney disease: Secondary | ICD-10-CM | POA: Diagnosis not present

## 2019-11-23 ENCOUNTER — Other Ambulatory Visit: Payer: Self-pay | Admitting: Nurse Practitioner

## 2019-11-23 ENCOUNTER — Telehealth: Payer: Self-pay | Admitting: Nurse Practitioner

## 2019-11-23 DIAGNOSIS — M79601 Pain in right arm: Secondary | ICD-10-CM

## 2019-11-23 MED ORDER — OXYCODONE HCL 10 MG PO TABS: 10.0000 mg | ORAL_TABLET | ORAL | 0 refills | Status: DC | PRN

## 2019-11-23 NOTE — Telephone Encounter (Signed)
sent 

## 2019-11-23 NOTE — Progress Notes (Unsigned)
   [facility] [address] [place], [place]  [address] Phone:  [phone]   Fax:  [phone] 

## 2019-12-08 ENCOUNTER — Other Ambulatory Visit (HOSPITAL_COMMUNITY)
Admission: RE | Admit: 2019-12-08 | Discharge: 2019-12-08 | Disposition: A | Discharge status: Home / Self Care | Payer: HMO | Source: Ambulatory Visit | Attending: Nurse Practitioner | Admitting: Nurse Practitioner

## 2019-12-08 DIAGNOSIS — Z20822 Contact with and (suspected) exposure to covid-19: Secondary | ICD-10-CM | POA: Diagnosis not present

## 2019-12-08 DIAGNOSIS — Z01812 Encounter for preprocedural laboratory examination: Secondary | ICD-10-CM | POA: Insufficient documentation

## 2019-12-08 LAB — SARS CORONAVIRUS 2 (TAT 6-24 HRS): SARS Coronavirus 2: NEGATIVE

## 2019-12-09 ENCOUNTER — Other Ambulatory Visit: Payer: Self-pay

## 2019-12-09 ENCOUNTER — Ambulatory Visit (HOSPITAL_COMMUNITY)
Admission: RE | Admit: 2019-12-09 | Discharge: 2019-12-09 | Disposition: A | Discharge status: Home / Self Care | Payer: HMO | Source: Ambulatory Visit | Attending: Nurse Practitioner | Admitting: Nurse Practitioner

## 2019-12-09 DIAGNOSIS — J988 Other specified respiratory disorders: Secondary | ICD-10-CM | POA: Diagnosis not present

## 2019-12-09 DIAGNOSIS — I4819 Other persistent atrial fibrillation: Secondary | ICD-10-CM | POA: Insufficient documentation

## 2019-12-09 LAB — PULMONARY FUNCTION TEST
DL/VA % pred: 70 %
DL/VA: 2.95 ml/min/mmHg/L
DLCO unc % pred: 53 %
DLCO unc: 15.26 ml/min/mmHg
FEF 25-75 Post: 0.96 L/sec
FEF 25-75 Pre: 0.65 L/sec
FEF2575-%Change-Post: 47 %
FEF2575-%Pred-Post: 32 %
FEF2575-%Pred-Pre: 21 %
FEV1-%Change-Post: 18 %
FEV1-%Pred-Post: 53 %
FEV1-%Pred-Pre: 44 %
FEV1-Post: 1.72 L
FEV1-Pre: 1.45 L
FEV1FVC-%Change-Post: 9 %
FEV1FVC-%Pred-Pre: 67 %
FEV6-%Change-Post: 10 %
FEV6-%Pred-Post: 70 %
FEV6-%Pred-Pre: 64 %
FEV6-Post: 2.84 L
FEV6-Pre: 2.58 L
FEV6FVC-%Change-Post: 1 %
FEV6FVC-%Pred-Post: 98 %
FEV6FVC-%Pred-Pre: 97 %
FVC-%Change-Post: 8 %
FVC-%Pred-Post: 71 %
FVC-%Pred-Pre: 66 %
FVC-Post: 2.98 L
FVC-Pre: 2.74 L
Post FEV1/FVC ratio: 58 %
Post FEV6/FVC ratio: 95 %
Pre FEV1/FVC ratio: 53 %
Pre FEV6/FVC Ratio: 94 %
RV % pred: 189 %
RV: 4.38 L
TLC % pred: 98 %
TLC: 7.06 L

## 2019-12-09 MED ORDER — ALBUTEROL SULFATE (2.5 MG/3ML) 0.083% IN NEBU
2.5000 mg | INHALATION_SOLUTION | Freq: Once | RESPIRATORY_TRACT | Status: AC
  Administered 2019-12-09: 2.5 mg via RESPIRATORY_TRACT

## 2019-12-11 ENCOUNTER — Other Ambulatory Visit (HOSPITAL_COMMUNITY): Payer: PPO

## 2019-12-14 ENCOUNTER — Encounter (HOSPITAL_COMMUNITY): Payer: PPO

## 2019-12-14 ENCOUNTER — Other Ambulatory Visit: Payer: Self-pay

## 2019-12-14 NOTE — Patient Outreach (Signed)
  [facility]) Care Management Chronic Special Needs Program    12/14/2019  Name: ANTWYNE PINGREE, DOB: 1956/03/30  MRN: [id number]   Mr. Major Klemp is enrolled in a chronic special needs plan for Heart Failure. Telephone call to client for initial assessment/ Health risk assessment review. Unable to reach. HIPAA compliant voice message left with call back phone number and return call request.   PLAN; RNCM will attempt 2nd telephone call to client within 1 month.   Quinn Plowman RN,BSN,CCM [facility] Management [phone]

## 2019-12-16 DIAGNOSIS — I158 Other secondary hypertension: Secondary | ICD-10-CM | POA: Diagnosis not present

## 2019-12-16 DIAGNOSIS — N186 End stage renal disease: Secondary | ICD-10-CM | POA: Diagnosis not present

## 2019-12-16 DIAGNOSIS — Z992 Dependence on renal dialysis: Secondary | ICD-10-CM | POA: Diagnosis not present

## 2019-12-17 DIAGNOSIS — N2581 Secondary hyperparathyroidism of renal origin: Secondary | ICD-10-CM | POA: Diagnosis not present

## 2019-12-17 DIAGNOSIS — E876 Hypokalemia: Secondary | ICD-10-CM | POA: Diagnosis not present

## 2019-12-17 DIAGNOSIS — Z992 Dependence on renal dialysis: Secondary | ICD-10-CM | POA: Diagnosis not present

## 2019-12-17 DIAGNOSIS — D631 Anemia in chronic kidney disease: Secondary | ICD-10-CM | POA: Diagnosis not present

## 2019-12-17 DIAGNOSIS — N186 End stage renal disease: Secondary | ICD-10-CM | POA: Diagnosis not present

## 2019-12-17 DIAGNOSIS — D509 Iron deficiency anemia, unspecified: Secondary | ICD-10-CM | POA: Diagnosis not present

## 2019-12-17 DIAGNOSIS — D689 Coagulation defect, unspecified: Secondary | ICD-10-CM | POA: Diagnosis not present

## 2019-12-25 ENCOUNTER — Ambulatory Visit: Payer: Self-pay

## 2019-12-25 NOTE — Progress Notes (Signed)
Primary Care Physician: [name], MD Referring Physician: [name], RN Cardiologist: Patrick [name] is a 64 y.o. male with a very complex medical history, sickle cell anemia, chronic systolic HF, ESRD,on dialysis,  legally blind, PAF, prior upper GI bleed. He was hospitalized in December 2019, loaded on amiodarone and per D/C instructions was to go home on 200 mg amiodarone bid. However, it appears pt did not receive prescription as when he returned for f/u in HF clinic, 06/18/19, he was in [place] but was not on amiodarone. Most of the EKG's in 2020 show afib or atrial flutter. He had a successful  cardioversion 10/06/19 with Dr. Haroldine [name], 10/06/19, but on f/u with [facility], NP,10/20/19, he was in atrial flutter at 114 bpm and was referred here.   EKg today show atrial flutter at 118 bpm. Pt is asymptomatic. BP is soft today at 100/64, he states that he had too much fluid pulled off on last dialysis. It ususally runs at 027'X systolic. He is on eliquis 5 mg bid with a CHA2DS2VASc score of 2.     Today, he denies symptoms of palpitations, chest pain, shortness of breath, orthopnea, PND, lower extremity edema, dizziness, presyncope, syncope, or neurologic sequela. The patient is tolerating medications without difficulties and is otherwise without complaint today.   Past Medical History:  Diagnosis Date  . Blood dyscrasia   . Blood transfusion    "I've had a bunch of them"  . CHF (congestive heart failure) ([place])    followed by hf clinic  . Chronic kidney disease (CKD), stage III (moderate)   . Dialysis patient ([place]) 04/2019   AV fistula (right arm)  . Gout   . AJOINOMV(672.0)    "probably weekly" (01/13/2014)  . Legally blind   . Shortness of breath dyspnea   . Sickle cell disease, type SS ([place])   . Swelling abdomen   . Swelling of extremity, left   . Swelling of extremity, right    Past Surgical History:  Procedure Laterality Date  . BASCILIC VEIN  TRANSPOSITION Right 04/12/2014   Procedure: BASILIC VEIN TRANSPOSITION;  Surgeon: Elam Dutch, MD;  Location: [place];  Service: Vascular;  Laterality: Right;  . CARDIAC CATHETERIZATION  05/2011  . CARDIOVERSION N/A 06/05/2018   Procedure: CARDIOVERSION;  Surgeon: Jolaine Artist, MD;  Location: [facility] ENDOSCOPY;  Service: Cardiovascular;  Laterality: N/A;  . CARDIOVERSION N/A 10/06/2019   Procedure: CARDIOVERSION;  Surgeon: Jolaine Artist, MD;  Location: [place];  Service: Cardiovascular;  Laterality: N/A;  . COLONOSCOPY WITH PROPOFOL N/A 09/24/2017   Procedure: COLONOSCOPY WITH PROPOFOL;  Surgeon: Jackquline Denmark, MD;  Location: [name] END[facility];  Service: Endoscopy;  Laterality: N/A;  . ESOPHAGOGASTRODUODENOSCOPY N/A 09/19/2017   Procedure: ESOPHAGOGASTRODUODENOSCOPY (EGD);  Surgeon: Jackquline Denmark, MD;  Location: [facility] ENDOSCOPY;  Service: Endoscopy;  Laterality: N/A;  . LEFT AND RIGHT HEART CATHETERIZATION WITH CORONARY ANGIOGRAM N/A 06/11/2011   Procedure: LEFT AND RIGHT HEART CATHETERIZATION WITH CORONARY ANGIOGRAM;  Surgeon: Larey Dresser, MD;  Location: [facility] CATH LAB;  Service: Cardiovascular;  Laterality: N/A;  . TEE WITHOUT CARDIOVERSION N/A 12/27/2015   Procedure: TRANSESOPHAGEAL ECHOCARDIOGRAM (TEE);  Surgeon: Sueanne Margarita, MD;  Location: [name] ENDOSCOPY;  Service: Cardiovascular;  Laterality: N/A;    Current Outpatient Medications  Medication Sig Dispense Refill  . acetaminophen (TYLENOL) 500 MG tablet Take 1,000 mg by mouth every 6 (six) hours as needed for moderate pain or headache.    . allopurinol (  ZYLOPRIM) 100 MG tablet Take 100 mg by mouth 2 (two) times daily.    . carvedilol (COREG) 12.5 MG tablet TAKE 1 TABLET BY MOUTH 2 (TWO) TIMES DAILY WITH A MEAL. 180 tablet 2  . diphenhydramine-acetaminophen (TYLENOL PM) 25-500 MG TABS tablet Take 2 tablets by mouth at bedtime.    Marland Kitchen ELIQUIS 5 MG TABS tablet TAKE 1 TABLET BY MOUTH TWICE A DAY 60 tablet 5  . ferric citrate (AURYXIA) 1 GM  210 MG(Fe) tablet Take 210 mg by mouth See admin instructions. Take 210 mg with each meal and snack    . folic acid (FOLVITE) 1 MG tablet TAKE 1 TABLET BY MOUTH EVERY DAY 90 tablet 3  . gabapentin (NEURONTIN) 100 MG capsule TAKE 1 CAPSULE BY MOUTH AT BEDTIME. 30 capsule 3  . hydroxyurea (HYDREA) 500 MG capsule TAKE ONE CAPSULE BY MOUTH EVERY DAY WITH FOOD TO MINIMIZE GI SIDE EFFECTS 90 capsule 3  . lidocaine-prilocaine (EMLA) cream Apply 1 application topically daily as needed (port access).     . Oxycodone HCl 10 MG TABS Take 1 tablet (10 mg total) by mouth every 4 (four) hours as needed. 60 tablet 0  . sildenafil (VIAGRA) 50 MG tablet Take 1 tablet (50 mg total) by mouth daily as needed for erectile dysfunction. Do not take same day as Isosorbide 10 tablet 0   No current facility-administered medications for this encounter.    No Known Allergies  Social History   Socioeconomic History  . Marital status: Divorced    Spouse name: Not on file  . Number of children: Not on file  . Years of education: Not on file  . Highest education level: Not on file  Occupational History  . Not on file  Tobacco Use  . Smoking status: Former Smoker    Packs/day: 0.25    Years: 41.00    Pack years: 10.25    Types: Cigarettes    Quit date: 12/08/2015    Years since quitting: 4.0  . Smokeless tobacco: Never Used  Vaping Use  . Vaping Use: Never used  Substance and Sexual Activity  . Alcohol use: Yes    Alcohol/week: 3.0 standard drinks    Types: 1 Glasses of wine, 2 Cans of beer per week    Comment: occasional  . Drug use: No  . Sexual activity: Not Currently  Other Topics Concern  . Not on file  Social History Narrative  . Not on file   Social Determinants of Health   Financial Resource Strain:   . Difficulty of Paying Living Expenses:   Food Insecurity:   . Worried About Charity fundraiser in the Last Year:   . Arboriculturist in the Last Year:   Transportation Needs: No  Transportation Needs  . Lack of Transportation (Medical): No  . Lack of Transportation (Non-Medical): No  Physical Activity: Sufficiently Active  . Days of Exercise per Week: 5 days  . Minutes of Exercise per Session: 90 min  Stress:   . Feeling of Stress :   Social Connections:   . Frequency of Communication with Friends and Family:   . Frequency of Social Gatherings with Friends and Family:   . Attends Religious Services:   . Active Member of Clubs or Organizations:   . Attends Archivist Meetings:   Marland Kitchen Marital Status:   Intimate Partner Violence:   . Fear of Current or Ex-Partner:   . Emotionally Abused:   Marland Kitchen Physically Abused:   .  Sexually Abused:     Family History  Problem Relation Age of Onset  . Alcohol abuse Mother   . Liver disease Mother   . Cirrhosis Mother   . Heart attack Mother     ROS- All systems are reviewed and negative except as per the HPI above  Physical Exam: Vitals:   10/23/19 1131  BP: 100/64  Pulse: (!) 118  Weight: 67.3 kg  Height: 5\' 11"  (1.803 m)   Wt Readings from Last 3 Encounters:  10/23/19 67.3 kg  10/20/19 66.9 kg  10/01/19 67.1 kg    Labs: Lab Results  Component Value Date   NA 134 (L) 10/01/2019   K 4.3 10/01/2019   CL 96 (L) 10/01/2019   CO2 26 10/01/2019   GLUCOSE 112 (H) 10/01/2019   BUN 37 (H) 10/01/2019   CREATININE 5.21 (H) 10/01/2019   CALCIUM 8.8 (L) 10/01/2019   PHOS 5.8 (H) 06/06/2018   MG 2.1 06/04/2018   Lab Results  Component Value Date   INR 1.4 (H) 09/11/2019   Lab Results  Component Value Date   CHOL 114 (L) 02/17/2015   HDL 31 (L) 02/17/2015   LDLCALC 68 02/17/2015   TRIG 76 02/17/2015     GEN- The patient is well appearing, alert and oriented x 3 today.   Head- normocephalic, atraumatic Eyes-  Sclera clear, conjunctiva pink Ears- hearing intact Oropharynx- clear Neck- supple, no JVP Lymph- no cervical lymphadenopathy Lungs- Clear to ausculation bilaterally, normal work of  breathing Heart- irregular rate and rhythm, no murmurs, rubs or gallops, PMI not laterally displaced GI- soft, NT, ND, + BS Extremities- no clubbing, cyanosis, or edema MS- no significant deformity or atrophy Skin- no rash or lesion Psych- euthymic mood, full affect Neuro- strength and sensation are intact  EKG-atrial  flutter at 118 bpm   CXR- 09/11/19-FINDINGS: The heart size is enlarged. There are prominent interstitial lung markings. Aortic calcifications are noted. There is no pneumothorax. No large pleural effusion. There is no focal infiltrate. No acute osseous abnormality.  Echo-09/12/19-IMPRESSIONS  1. Left ventricular ejection fraction, by estimation, is 35 to 40%. The  left ventricle has moderately decreased function. The left ventricle  demonstrates global hypokinesis. Left ventricular diastolic function could  not be evaluated.  2. Right ventricular systolic function is low normal. The right  ventricular size is moderately enlarged. There is severely elevated  pulmonary artery systolic pressure. The estimated right ventricular  systolic pressure is 37.1 mmHg.  3. Left atrial size was severely dilated.  4. Right atrial size was moderately dilated.  5. The mitral valve is grossly normal. Moderate mitral valve  regurgitation. No evidence of mitral stenosis.  6. Tricuspid valve regurgitation is severe.  7. The aortic valve is grossly normal. Aortic valve regurgitation is not  visualized. Mild aortic valve sclerosis is present, with no evidence of  aortic valve stenosis.    Assessment and Plan: 1. Persistent  Afib/flutter  It appears that pt  May have spent the majority of 2020, by reviewed EKG's in Epic in  afib /flutter.  Successful cardioversion 10/06/19  but with ERAF by f/u 5/4 It appears that he  was started on amiodarone during  hospitalization 06/05/2018 and in d/c summary he was to be discharged on 200 mg bid , but his d/c med list did not reflect  rx for  this . On d/c f/u in HF clinic, 06/17/20, he was in [place] and there was no indication of amio on his med list  and no mention by the provider that he was not taking amiodarone.  I discussed with the Pt risk vrs benefit of amiodarone He was concerned re possible corneal deposits as he is legally blind, I answered questions re this and usually corneal deposits are not a hard stop for discontinuation of  amio  He also had interstitial changes on last CXR s with h/o Pulmonary HTN Liver panel in April  showed normal liver enzymes with minimally  elevated bilirubin and I do not see a recent TSH I will discuss with Dr. Rayann Heman to see if he feels amiodarone should be restarted  May need baseline PFT's to see if lung status is acceptable  to start as well as baseline hepatic panel and  TSH  2. CHA2DS2VASc score of 2 Continue eliquis  3. CHF Per HF clinic  4. ESRD On dialysis   I will further discuss with pt after discussion with Dr. Rayann Heman next week   5/11-addendum- Dr. Rayann Heman feels that amiodarone is his only option. I discussed with pt and he is willing to try this to restore SR. I will obtain PFT's and then start drug at 200 mg bid with anticipation of cardioversion in one month. Pt is in agreement. I will repeat cmet and TSH at the  one week post amio start here.   7/9- Pt had PFT's that showed severe obstructive airway disease, restrictive process, mod-severe diffusion defect. I discussed with Dr. Haroldine [name]. When I saw pt in the office, he did not appear to be symptomatic with afib. If this is still the case, then Dr. B feels the risk of the drug to the lungs  is not worth the benefit to get him back in rhythm. I placed a call to the pt to further discuss this and to  recommend a f/u in pulmonology.   Patrick [name], Patrick [name] [address] [place], [place] [address] [phone]

## 2019-12-28 ENCOUNTER — Other Ambulatory Visit: Payer: Self-pay

## 2019-12-28 NOTE — Patient Outreach (Signed)
  [facility]) Care Management Chronic Special Needs Program    12/28/2019  Name: Patrick [name], DOB: February 13, 1956  MRN: [id number]   [name] is enrolled in a chronic special needs plan.  Second telephone outreach to client for Health risk assessment review/ initial telephone outreach. Unable to reach client. HIPAA compliant voice message left with call back phone number and return call request.   PLAN:  RNCM will attempt 3rd telephone outreach to client within 1 month.   Quinn Plowman RN,BSN,CCM [facility] Care Management [phone]

## 2019-12-29 ENCOUNTER — Other Ambulatory Visit: Payer: Self-pay

## 2019-12-29 ENCOUNTER — Encounter: Payer: Self-pay | Admitting: Family Medicine

## 2019-12-29 ENCOUNTER — Ambulatory Visit (INDEPENDENT_AMBULATORY_CARE_PROVIDER_SITE_OTHER): Payer: HMO | Admitting: Family Medicine

## 2019-12-29 VITALS — BP 124/80 | HR 106 | Temp 98.1°F | Ht 71.0 in | Wt 164.0 lb

## 2019-12-29 DIAGNOSIS — I10 Essential (primary) hypertension: Secondary | ICD-10-CM

## 2019-12-29 DIAGNOSIS — G894 Chronic pain syndrome: Secondary | ICD-10-CM

## 2019-12-29 DIAGNOSIS — D571 Sickle-cell disease without crisis: Secondary | ICD-10-CM | POA: Diagnosis not present

## 2019-12-29 DIAGNOSIS — N186 End stage renal disease: Secondary | ICD-10-CM | POA: Diagnosis not present

## 2019-12-29 MED ORDER — OXYCODONE HCL 10 MG PO TABS: 10.0000 mg | ORAL_TABLET | ORAL | 0 refills | Status: DC | PRN

## 2019-12-29 NOTE — Progress Notes (Signed)
Patient [facility]   Established Patient Office Visit  Subjective:  Patient ID: Patrick [name], male    DOB: [birth year]  Age: 64 y.o. MRN: [id number]  CC:  Chief Complaint  Patient presents with  . Follow-up    3 month follow up, refill Oxycodone    HPI Patrick [name] is a very pleasant 64 year old male with a medical history of sickle cell disease, end-stage renal disease on dialysis, chronic heart failure with preserved EF, history of anemia of chronic disease, and tobacco dependence presents for follow-up of sickle cell disease.  Patient's sickle cell is well controlled, he has infrequent pain crises.  However, patient does have chronic pain related to sickle cell disease.  Pain is primarily to low back and is rated at 5/10.  He says that pain is controlled on oxycodone, however he is out of medication at this time.  He is requesting a refill on medication.  Patient continues to follow with all specialists as scheduled.  He undergoes dialysis on Tuesday, Thursday, and Saturdays.  He denies any headache, chest pain, dysuria, urinary frequency or urgency, nausea, vomiting, or diarrhea.  Patient is up-to-date with vaccinations.  Past Medical History:  Diagnosis Date  . Blood dyscrasia   . Blood transfusion    "I've had a bunch of them"  . CHF (congestive heart failure) ([place])    followed by hf clinic  . Chronic kidney disease (CKD), stage III (moderate)   . Dialysis patient ([place]) 04/2019   AV fistula (right arm)  . Gout   . ZPHXTAVW(979.4)    "probably weekly" (01/13/2014)  . Legally blind   . Shortness of breath dyspnea   . Sickle cell disease, type SS ([place])   . Swelling abdomen   . Swelling of extremity, left   . Swelling of extremity, right     Past Surgical History:  Procedure Laterality Date  . BASCILIC VEIN TRANSPOSITION Right 04/12/2014   Procedure: BASILIC VEIN TRANSPOSITION;  Surgeon: Elam Dutch, MD;  Location:  [place];  Service: Vascular;  Laterality: Right;  . CARDIAC CATHETERIZATION  05/2011  . CARDIOVERSION N/A 06/05/2018   Procedure: CARDIOVERSION;  Surgeon: Jolaine Artist, MD;  Location: [facility] ENDOSCOPY;  Service: Cardiovascular;  Laterality: N/A;  . CARDIOVERSION N/A 10/06/2019   Procedure: CARDIOVERSION;  Surgeon: Jolaine Artist, MD;  Location: [place];  Service: Cardiovascular;  Laterality: N/A;  . COLONOSCOPY WITH PROPOFOL N/A 09/24/2017   Procedure: COLONOSCOPY WITH PROPOFOL;  Surgeon: Jackquline Denmark, MD;  Location: [facility] ENDOSCOPY;  Service: Endoscopy;  Laterality: N/A;  . ESOPHAGOGASTRODUODENOSCOPY N/A 09/19/2017   Procedure: ESOPHAGOGASTRODUODENOSCOPY (EGD);  Surgeon: Jackquline Denmark, MD;  Location: [facility] ENDOSCOPY;  Service: Endoscopy;  Laterality: N/A;  . LEFT AND RIGHT HEART CATHETERIZATION WITH CORONARY ANGIOGRAM N/A 06/11/2011   Procedure: LEFT AND RIGHT HEART CATHETERIZATION WITH CORONARY ANGIOGRAM;  Surgeon: Larey Dresser, MD;  Location: [facility] CATH LAB;  Service: Cardiovascular;  Laterality: N/A;  . TEE WITHOUT CARDIOVERSION N/A 12/27/2015   Procedure: TRANSESOPHAGEAL ECHOCARDIOGRAM (TEE);  Surgeon: Sueanne Margarita, MD;  Location: [facility] ENDOSCOPY;  Service: Cardiovascular;  Laterality: N/A;    Family History  Problem Relation Age of Onset  . Alcohol abuse Mother   . Liver disease Mother   . Cirrhosis Mother   . Heart attack Mother     Social History   Socioeconomic History  . Marital status: Divorced    Spouse name: Not on file  . Number of children:  Not on file  . Years of education: Not on file  . Highest education level: Not on file  Occupational History  . Not on file  Tobacco Use  . Smoking status: Former Smoker    Packs/day: 0.25    Years: 41.00    Pack years: 10.25    Types: Cigarettes    Quit date: 12/08/2015    Years since quitting: 4.0  . Smokeless tobacco: Never Used  Vaping Use  . Vaping Use: Never used  Substance and Sexual Activity  . Alcohol use: Yes     Alcohol/week: 3.0 standard drinks    Types: 1 Glasses of wine, 2 Cans of beer per week    Comment: occasional  . Drug use: No  . Sexual activity: Not Currently  Other Topics Concern  . Not on file  Social History Narrative  . Not on file   Social Determinants of Health   Financial Resource Strain:   . Difficulty of Paying Living Expenses:   Food Insecurity:   . Worried About Charity fundraiser in the Last Year:   . Arboriculturist in the Last Year:   Transportation Needs: No Transportation Needs  . Lack of Transportation (Medical): No  . Lack of Transportation (Non-Medical): No  Physical Activity: Sufficiently Active  . Days of Exercise per Week: 5 days  . Minutes of Exercise per Session: 90 min  Stress:   . Feeling of Stress :   Social Connections:   . Frequency of Communication with Friends and Family:   . Frequency of Social Gatherings with Friends and Family:   . Attends Religious Services:   . Active Member of Clubs or Organizations:   . Attends Archivist Meetings:   Marland Kitchen Marital Status:   Intimate Partner Violence:   . Fear of Current or Ex-Partner:   . Emotionally Abused:   Marland Kitchen Physically Abused:   . Sexually Abused:     Outpatient Medications Prior to Visit  Medication Sig Dispense Refill  . acetaminophen (TYLENOL) 500 MG tablet Take 1,000 mg by mouth every 6 (six) hours as needed for moderate pain or headache.    . allopurinol (ZYLOPRIM) 100 MG tablet Take 100 mg by mouth 2 (two) times daily.    . carvedilol (COREG) 12.5 MG tablet TAKE 1 TABLET BY MOUTH 2 (TWO) TIMES DAILY WITH A MEAL. 180 tablet 2  . diphenhydramine-acetaminophen (TYLENOL PM) 25-500 MG TABS tablet Take 2 tablets by mouth at bedtime.    Marland Kitchen ELIQUIS 5 MG TABS tablet TAKE 1 TABLET BY MOUTH TWICE A DAY 60 tablet 5  . ferric citrate (AURYXIA) 1 GM 210 MG(Fe) tablet Take 210 mg by mouth See admin instructions. Take 210 mg with each meal and snack    . folic acid (FOLVITE) 1 MG tablet TAKE 1  TABLET BY MOUTH EVERY DAY 90 tablet 3  . gabapentin (NEURONTIN) 100 MG capsule TAKE 1 CAPSULE BY MOUTH AT BEDTIME. 30 capsule 3  . hydroxyurea (HYDREA) 500 MG capsule TAKE ONE CAPSULE BY MOUTH EVERY DAY WITH FOOD TO MINIMIZE GI SIDE EFFECTS 90 capsule 3  . lidocaine-prilocaine (EMLA) cream Apply 1 application topically daily as needed (port access).     . sildenafil (VIAGRA) 50 MG tablet Take 1 tablet (50 mg total) by mouth daily as needed for erectile dysfunction. Do not take same day as Isosorbide 10 tablet 0  . Oxycodone HCl 10 MG TABS Take 1 tablet (10 mg total) by mouth every 4 (  four) hours as needed. 60 tablet 0   No facility-administered medications prior to visit.    No Known Allergies  ROS Review of Systems  Constitutional: Negative.   HENT: Negative.   Respiratory: Negative.   Cardiovascular: Negative.   Gastrointestinal: Negative.   Musculoskeletal: Positive for arthralgias and back pain.  Allergic/Immunologic: Negative.   Neurological: Negative.   Hematological: Negative.   Psychiatric/Behavioral: Negative.       Objective:    Physical Exam Constitutional:      Appearance: Normal appearance.  Cardiovascular:     Rate and Rhythm: Normal rate. Rhythm irregular.     Heart sounds: Murmur heard.   Pulmonary:     Effort: Pulmonary effort is normal.  Abdominal:     General: Abdomen is flat. Bowel sounds are normal.  Skin:    General: Skin is warm.  Neurological:     Mental Status: He is alert.  Psychiatric:        Mood and Affect: Mood normal.        Behavior: Behavior normal.        Thought Content: Thought content normal.        Judgment: Judgment normal.     BP 124/80   Pulse (!) 106   Temp 98.1 F (36.7 C)   Ht 5\' 11"  (1.803 m)   Wt 164 lb 0.6 oz (74.4 kg)   SpO2 99%   BMI 22.88 kg/m  Wt Readings from Last 3 Encounters:  12/29/19 164 lb 0.6 oz (74.4 kg)  10/23/19 148 lb 6.4 oz (67.3 kg)  10/20/19 147 lb 6.4 oz (66.9 kg)     Health  Maintenance Due  Topic Date Due  . URINE MICROALBUMIN  Never done    There are no preventive care reminders to display for this patient.  Lab Results  Component Value Date   TSH 2.301 02/17/2015   Lab Results  Component Value Date   WBC 5.0 10/01/2019   HGB 8.7 (L) 10/01/2019   HCT 23.4 (L) 10/01/2019   MCV 115.8 (H) 10/01/2019   PLT 104 (L) 10/01/2019   Lab Results  Component Value Date   NA 134 (L) 10/01/2019   K 4.3 10/01/2019   CO2 26 10/01/2019   GLUCOSE 112 (H) 10/01/2019   BUN 37 (H) 10/01/2019   CREATININE 5.21 (H) 10/01/2019   BILITOT 1.3 (H) 09/29/2019   ALKPHOS 121 (H) 09/29/2019   AST 32 09/29/2019   ALT 11 09/29/2019   PROT 7.3 09/29/2019   ALBUMIN 4.4 09/29/2019   CALCIUM 8.8 (L) 10/01/2019   ANIONGAP 12 10/01/2019   GFR 46.63 (L) 01/08/2012   Lab Results  Component Value Date   CHOL 114 (L) 02/17/2015   Lab Results  Component Value Date   HDL 31 (L) 02/17/2015   Lab Results  Component Value Date   LDLCALC 68 02/17/2015   Lab Results  Component Value Date   TRIG 76 02/17/2015   Lab Results  Component Value Date   CHOLHDL 3.7 02/17/2015   Lab Results  Component Value Date   HGBA1C <4.0 06/07/2011      Assessment & Plan:   Problem List Items Addressed This Visit    None    Visit Diagnoses    Arm pain, diffuse, right       Relevant Medications   Oxycodone HCl 10 MG TABS      Meds ordered this encounter  Medications  . Oxycodone HCl 10 MG TABS    Sig: Take  1 tablet (10 mg total) by mouth every 4 (four) hours as needed.    Dispense:  60 tablet    Refill:  0    Order Specific Question:   Supervising Provider    Answer:   Angelica Chessman E [[id number]]   4.  Chronic pain syndrome:  - Oxycodone HCl 10 MG TABS; Take 1 tablet (10 mg total) by mouth every 4 (four) hours as needed.  Dispense: 60 tablet; Refill: 0  2. Hb-SS disease without crisis [facility]) Patient has well-controlled sickle cell disease with infrequent pain  crisis. Chronic pain is controlled on current medication regimen We will follow-up by phone after reviewing all laboratory values - Sickle Cell Panel  3. End stage renal disease [facility]) Patient undergoes dialysis on Tuesday, Thursday, and Saturdays.  He is following consistently.  Also, patient is followed by nephrology and has frequent labs.  4. Essential hypertension Blood pressure controlled on current medication regimen.  Followed by cardiology.  Follow-up: Follow-up in around 3 months for medication management   [name]  APRN, MSN, FNP-C Patient [facility] [address] [place], [place] [address] [phone]

## 2019-12-30 ENCOUNTER — Other Ambulatory Visit (HOSPITAL_COMMUNITY): Payer: Self-pay | Admitting: *Deleted

## 2019-12-30 DIAGNOSIS — R942 Abnormal results of pulmonary function studies: Secondary | ICD-10-CM

## 2019-12-30 LAB — CMP14+CBC/D/PLT+FER+RETIC+V...
ALT: 22 IU/L (ref 0–44)
AST: 34 IU/L (ref 0–40)
Albumin/Globulin Ratio: 1.3 (ref 1.2–2.2)
Albumin: 4 g/dL (ref 3.8–4.8)
Alkaline Phosphatase: 146 IU/L — ABNORMAL HIGH (ref 48–121)
BUN/Creatinine Ratio: 9 — ABNORMAL LOW (ref 10–24)
BUN: 51 mg/dL — ABNORMAL HIGH (ref 8–27)
Basophils Absolute: 0.1 10*3/uL (ref 0.0–0.2)
Basos: 2 %
Bilirubin Total: 0.9 mg/dL (ref 0.0–1.2)
CO2: 25 mmol/L (ref 20–29)
Calcium: 8.5 mg/dL — ABNORMAL LOW (ref 8.6–10.2)
Chloride: 99 mmol/L (ref 96–106)
Creatinine, Ser: 5.99 mg/dL — ABNORMAL HIGH (ref 0.76–1.27)
EOS (ABSOLUTE): 0.3 10*3/uL (ref 0.0–0.4)
Eos: 7 %
Ferritin: 186 ng/mL (ref 30–400)
GFR calc Af Amer: 11 mL/min/{1.73_m2} — ABNORMAL LOW (ref >59)
GFR calc non Af Amer: 9 mL/min/{1.73_m2} — ABNORMAL LOW (ref >59)
Globulin, Total: 3 g/dL (ref 1.5–4.5)
Glucose: 86 mg/dL (ref 65–99)
Hematocrit: 26.1 % — ABNORMAL LOW (ref 37.5–51.0)
Hemoglobin: 8.9 g/dL — ABNORMAL LOW (ref 13.0–17.7)
Immature Grans (Abs): 0 10*3/uL (ref 0.0–0.1)
Immature Granulocytes: 1 %
Lymphocytes Absolute: 1.1 10*3/uL (ref 0.7–3.1)
Lymphs: 26 %
MCH: 42.4 pg — ABNORMAL HIGH (ref 26.6–33.0)
MCHC: 34.1 g/dL (ref 31.5–35.7)
MCV: 124 fL — ABNORMAL HIGH (ref 79–97)
Monocytes Absolute: 0.7 10*3/uL (ref 0.1–0.9)
Monocytes: 15 %
NRBC: 7 % — ABNORMAL HIGH (ref 0–0)
Neutrophils Absolute: 2.2 10*3/uL (ref 1.4–7.0)
Neutrophils: 49 %
Platelets: 144 10*3/uL — ABNORMAL LOW (ref 150–450)
Potassium: 4.7 mmol/L (ref 3.5–5.2)
RBC: 2.1 x10E6/uL — CL (ref 4.14–5.80)
RDW: 15.2 % (ref 11.6–15.4)
Retic Ct Pct: 5.1 % — ABNORMAL HIGH (ref 0.6–2.6)
Sodium: 137 mmol/L (ref 134–144)
Total Protein: 7 g/dL (ref 6.0–8.5)
Vit D, 25-Hydroxy: 11.4 ng/mL — ABNORMAL LOW (ref 30.0–100.0)
WBC: 4.4 10*3/uL (ref 3.4–10.8)

## 2020-01-03 ENCOUNTER — Other Ambulatory Visit (HOSPITAL_COMMUNITY): Payer: Self-pay | Admitting: Internal Medicine

## 2020-01-05 ENCOUNTER — Telehealth: Payer: Self-pay

## 2020-01-05 NOTE — Telephone Encounter (Signed)
Called, no answer. Left a message to call back.  

## 2020-01-05 NOTE — Telephone Encounter (Signed)
-----   Message from Dorena Dew, [place] sent at 01/05/2020  1:45 PM EDT ----- Regarding: lab results Please inform patient that hemoglobin is 11.4, which appears to be consistent with his baseline. Continue to hydrate consistently and folic acid 1 mg daily.    [name]  APRN, MSN, FNP-C Patient [facility] [address] [place], [place] [address] [phone]

## 2020-01-07 NOTE — Telephone Encounter (Signed)
Called, no answer. Left a message to call back.  

## 2020-01-11 NOTE — Telephone Encounter (Signed)
Called and spoke with patient, advised that hemoglobin was 11.4 which is consistent with baseline. Advised to take folic acid 1mg  daily and to hydrate consistently. Patient verbalized understanding. Thanks!

## 2020-01-13 ENCOUNTER — Other Ambulatory Visit (HOSPITAL_COMMUNITY): Payer: Self-pay | Admitting: Internal Medicine

## 2020-01-14 ENCOUNTER — Other Ambulatory Visit: Payer: Self-pay

## 2020-01-14 NOTE — Patient Outreach (Signed)
[facility]) Care Management Chronic Special Needs Program  01/14/2020  Name: [name] DOB: February 05, 1956  MRN: [id number]  [name] is enrolled in a chronic special needs plan for Heart Failure. Chronic Care Management Coordinator telephoned client to review health risk assessment and to develop individualized care plan. Unable to reach. HIPAA compliant voice message left with call back phone number and return call request. . Individualized care plan developed based on completed Health risk assessment and available data.    Goals Addressed            This Visit's Progress   . Client understands the importance of follow-up with providers by attending scheduled visits       Primary care provider visit: 12/29/19 Nephrology visit 11/16/19  Heart failure clinic: 10/20/19 Continue to follow up with your providers as recommended.    . Client will report abillity to obtain Medications within 6 months.       Contact your assigned RN case manager [phone]) if you have difficulty obtaining your medications.  Contact your doctor if you have questions/ concerns regarding your medications.     . Client will verbalize knowledge of self management of Hypertension as evidences by BP reading of 140/90 or less; or as defined by provider       Do not skip doses of your blood pressure medication. The medicine does not work as well if you skip doses.  Skipping doses also puts you at risk for problems.  Ask your doctor about side effects or reactions to medicines that you should watch for. Continue to take your medications as prescribed.  RN case manager will send client education article: High Blood pressures in adult    . Decrease inpatient Heart Failure admissions/ readmissions with in the year       Continue to take your medications as prescribed.  Follow up with your providers as recommended.  Follow a low salt meal plan, read food labels for the sodium (salt  content).  It is important to weigh daily and write down weights.  Pay attention to your body if you have any shortness of breath, swelling in your feet, ankles, legs or your waistband gets tight.  Report these symptoms to your doctor as soon as possible.  RN case manager will send client education article: Heart failure, Adult    . Maintain timely refills of Heart Failure medication as prescribed within the year        Take your medications as prescribed.  Contact your doctor if you have questions regarding your medications Contact your RN case manager [phone]) if you are unable to obtain your medications.    . Obtain annual  Lipid Profile, LDL-C       Last noted Lipid profile 02/17/15 Getting your cholesterol levels checked is an important part of staying healthy.  High cholesterol increases your risk of heart disease and stroke. The goal for LDL is less than 70 mg/dl as you are at high risk for complications.  RN case manager will send client education article: High Cholesterol     . Visit Primary Care Provider or Cardiologist at least 2 times per year       Most recent primary care provider visit:  12/29/19 Follow up with your provider for your annual wellness exam, routine follow up visit and lab work as recommended.        Plan:  Send successful outreach letter with a copy of their individualized care  plan, Send individual care plan to provider and Send educational material  Chronic care management coordination will outreach in:  6 Months    Quinn Plowman RN,BSN,CCM [facility] [phone]

## 2020-01-16 ENCOUNTER — Other Ambulatory Visit: Payer: Self-pay | Admitting: Family Medicine

## 2020-01-16 DIAGNOSIS — M79601 Pain in right arm: Secondary | ICD-10-CM

## 2020-01-16 DIAGNOSIS — N186 End stage renal disease: Secondary | ICD-10-CM | POA: Diagnosis not present

## 2020-01-16 DIAGNOSIS — Z992 Dependence on renal dialysis: Secondary | ICD-10-CM | POA: Diagnosis not present

## 2020-01-16 DIAGNOSIS — I158 Other secondary hypertension: Secondary | ICD-10-CM | POA: Diagnosis not present

## 2020-01-18 ENCOUNTER — Ambulatory Visit: Payer: Self-pay

## 2020-01-19 DIAGNOSIS — N2581 Secondary hyperparathyroidism of renal origin: Secondary | ICD-10-CM | POA: Diagnosis not present

## 2020-01-19 DIAGNOSIS — D509 Iron deficiency anemia, unspecified: Secondary | ICD-10-CM | POA: Diagnosis not present

## 2020-01-19 DIAGNOSIS — E876 Hypokalemia: Secondary | ICD-10-CM | POA: Diagnosis not present

## 2020-01-19 DIAGNOSIS — D631 Anemia in chronic kidney disease: Secondary | ICD-10-CM | POA: Diagnosis not present

## 2020-01-19 DIAGNOSIS — D689 Coagulation defect, unspecified: Secondary | ICD-10-CM | POA: Diagnosis not present

## 2020-01-19 DIAGNOSIS — N186 End stage renal disease: Secondary | ICD-10-CM | POA: Diagnosis not present

## 2020-01-19 DIAGNOSIS — Z992 Dependence on renal dialysis: Secondary | ICD-10-CM | POA: Diagnosis not present

## 2020-01-22 ENCOUNTER — Other Ambulatory Visit (HOSPITAL_COMMUNITY): Payer: Self-pay | Admitting: Internal Medicine

## 2020-01-25 ENCOUNTER — Other Ambulatory Visit: Payer: Self-pay | Admitting: Family Medicine

## 2020-01-25 ENCOUNTER — Telehealth: Payer: Self-pay | Admitting: Family Medicine

## 2020-01-25 DIAGNOSIS — G894 Chronic pain syndrome: Secondary | ICD-10-CM

## 2020-01-25 MED ORDER — OXYCODONE HCL 10 MG PO TABS: 10.0000 mg | ORAL_TABLET | ORAL | 0 refills | Status: DC | PRN

## 2020-01-25 NOTE — Progress Notes (Signed)
Reviewed PDMP substance reporting system prior to prescribing opiate medications. No inconsistencies noted.  Meds ordered this encounter  Medications   Oxycodone HCl 10 MG TABS    Sig: Take 1 tablet (10 mg total) by mouth every 4 (four) hours as needed.    Dispense:  60 tablet    Refill:  0    Order Specific Question:   Supervising Provider    Answer:   JEGEDE, OLUGBEMIGA E [[id number]]   Siddhi Dornbush Moore Quetzalli Clos  APRN, MSN, FNP-C [facility] [address]  [place], [place] [address] [phone]  

## 2020-01-25 NOTE — Telephone Encounter (Signed)
holl 

## 2020-01-26 ENCOUNTER — Ambulatory Visit (INDEPENDENT_AMBULATORY_CARE_PROVIDER_SITE_OTHER): Payer: HMO | Admitting: Family Medicine

## 2020-01-26 ENCOUNTER — Other Ambulatory Visit: Payer: Self-pay

## 2020-01-26 ENCOUNTER — Encounter: Payer: Self-pay | Admitting: Family Medicine

## 2020-01-26 VITALS — BP 130/88 | HR 80 | Temp 98.4°F | Resp 14 | Ht 71.0 in | Wt 151.0 lb

## 2020-01-26 DIAGNOSIS — M545 Low back pain, unspecified: Secondary | ICD-10-CM

## 2020-01-26 DIAGNOSIS — R8290 Unspecified abnormal findings in urine: Secondary | ICD-10-CM | POA: Diagnosis not present

## 2020-01-26 LAB — POCT URINALYSIS DIPSTICK
Bilirubin, UA: NEGATIVE
Glucose, UA: NEGATIVE
Ketones, UA: NEGATIVE
Leukocytes, UA: NEGATIVE
Nitrite, UA: NEGATIVE
Protein, UA: POSITIVE — AB
Spec Grav, UA: 1.02 (ref 1.010–1.025)
Urobilinogen, UA: 1 E.U./dL
pH, UA: 8.5 — AB (ref 5.0–8.0)

## 2020-01-26 MED ORDER — TIZANIDINE HCL 4 MG PO TABS: 2.0000 mg | ORAL_TABLET | Freq: Three times a day (TID) | ORAL | 1 refills | Status: DC | PRN

## 2020-01-26 NOTE — Progress Notes (Signed)
[name] and Sickle Cell Care   Established Patient Office Visit  Subjective:  Patient ID: Patrick [name], male    DOB: 1955/10/14  Age: 64 y.o. MRN: [id number]  CC:  Chief Complaint  Patient presents with  . Back Pain    back pain/muscle spasms     HPI Patrick [name] is a very pleasant 64 year old male with a medical history significant for sickle cell disease, end-stage renal disease on dialysis, chronic heart failure with preserved EF, history of anemia of chronic disease, and tobacco dependence who is legally blind presents complaining of worsening low back pain with spasms for greater than 1 week.  Patient states that he awakened around a week ago with sharp low back pain with periodic spasms.  He states that he felt as if his back was "tight".  Back pain was unrelieved by prescribed oxycodone.  Currently is back 5/10 characterized as intermittent, throbbing, and occasionally sharp.  He denies any dizziness, headache, shortness of breath, recent falls or injury, nausea, vomiting, or diarrhea.  Past Medical History:  Diagnosis Date  . Blood dyscrasia   . Blood transfusion    "I've had a bunch of them"  . CHF (congestive heart failure) ([place])    followed by hf clinic  . Chronic kidney disease (CKD), stage III (moderate)   . Dialysis patient ([place]) 04/2019   AV fistula (right arm)  . Gout   . XNTZGYFV(494.4)    "probably weekly" (01/13/2014)  . Legally blind   . Shortness of breath dyspnea   . Sickle cell disease, type SS ([place])   . Swelling abdomen   . Swelling of extremity, left   . Swelling of extremity, right     Past Surgical History:  Procedure Laterality Date  . BASCILIC VEIN TRANSPOSITION Right 04/12/2014   Procedure: BASILIC VEIN TRANSPOSITION;  Surgeon: Elam Dutch, MD;  Location: [place];  Service: Vascular;  Laterality: Right;  . CARDIAC CATHETERIZATION  05/2011  . CARDIOVERSION N/A 06/05/2018   Procedure:  CARDIOVERSION;  Surgeon: Jolaine Artist, MD;  Location: [facility] ENDOSCOPY;  Service: Cardiovascular;  Laterality: N/A;  . CARDIOVERSION N/A 10/06/2019   Procedure: CARDIOVERSION;  Surgeon: Jolaine Artist, MD;  Location: [place];  Service: Cardiovascular;  Laterality: N/A;  . COLONOSCOPY WITH PROPOFOL N/A 09/24/2017   Procedure: COLONOSCOPY WITH PROPOFOL;  Surgeon: Jackquline Denmark, MD;  Location: [facility] ENDOSCOPY;  Service: Endoscopy;  Laterality: N/A;  . ESOPHAGOGASTRODUODENOSCOPY N/A 09/19/2017   Procedure: ESOPHAGOGASTRODUODENOSCOPY (EGD);  Surgeon: Jackquline Denmark, MD;  Location: [facility] ENDOSCOPY;  Service: Endoscopy;  Laterality: N/A;  . LEFT AND RIGHT HEART CATHETERIZATION WITH CORONARY ANGIOGRAM N/A 06/11/2011   Procedure: LEFT AND RIGHT HEART CATHETERIZATION WITH CORONARY ANGIOGRAM;  Surgeon: Larey Dresser, MD;  Location: [facility] CATH LAB;  Service: Cardiovascular;  Laterality: N/A;  . TEE WITHOUT CARDIOVERSION N/A 12/27/2015   Procedure: TRANSESOPHAGEAL ECHOCARDIOGRAM (TEE);  Surgeon: Sueanne Margarita, MD;  Location: [facility] ENDOSCOPY;  Service: Cardiovascular;  Laterality: N/A;    Family History  Problem Relation Age of Onset  . Alcohol abuse Mother   . Liver disease Mother   . Cirrhosis Mother   . Heart attack Mother     Social History   Socioeconomic History  . Marital status: Divorced    Spouse name: Not on file  . Number of children: Not on file  . Years of education: Not on file  . Highest education level: Not on file  Occupational History  . Not  on file  Tobacco Use  . Smoking status: Former Smoker    Packs/day: 0.25    Years: 41.00    Pack years: 10.25    Types: Cigarettes    Quit date: 12/08/2015    Years since quitting: 4.1  . Smokeless tobacco: Never Used  Vaping Use  . Vaping Use: Never used  Substance and Sexual Activity  . Alcohol use: Yes    Alcohol/week: 3.0 standard drinks    Types: 1 Glasses of wine, 2 Cans of beer per week    Comment: occasional  . Drug use: No    . Sexual activity: Not Currently  Other Topics Concern  . Not on file  Social History Narrative  . Not on file   Social Determinants of Health   Financial Resource Strain:   . Difficulty of Paying Living Expenses:   Food Insecurity:   . Worried About Charity fundraiser in the Last Year:   . Arboriculturist in the Last Year:   Transportation Needs: No Transportation Needs  . Lack of Transportation (Medical): No  . Lack of Transportation (Non-Medical): No  Physical Activity: Sufficiently Active  . Days of Exercise per Week: 5 days  . Minutes of Exercise per Session: 90 min  Stress:   . Feeling of Stress :   Social Connections:   . Frequency of Communication with Friends and Family:   . Frequency of Social Gatherings with Friends and Family:   . Attends Religious Services:   . Active Member of Clubs or Organizations:   . Attends Archivist Meetings:   Marland Kitchen Marital Status:   Intimate Partner Violence:   . Fear of Current or Ex-Partner:   . Emotionally Abused:   Marland Kitchen Physically Abused:   . Sexually Abused:     Outpatient Medications Prior to Visit  Medication Sig Dispense Refill  . acetaminophen (TYLENOL) 500 MG tablet Take 1,000 mg by mouth every 6 (six) hours as needed for moderate pain or headache.    . allopurinol (ZYLOPRIM) 100 MG tablet Take 100 mg by mouth 2 (two) times daily.    . carvedilol (COREG) 12.5 MG tablet TAKE 1 TABLET BY MOUTH 2 (TWO) TIMES DAILY WITH A MEAL. 180 tablet 2  . diphenhydramine-acetaminophen (TYLENOL PM) 25-500 MG TABS tablet Take 2 tablets by mouth at bedtime.    Marland Kitchen ELIQUIS 5 MG TABS tablet TAKE 1 TABLET BY MOUTH TWICE A DAY 60 tablet 11  . ferric citrate (AURYXIA) 1 GM 210 MG(Fe) tablet Take 210 mg by mouth See admin instructions. Take 210 mg with each meal and snack    . folic acid (FOLVITE) 1 MG tablet TAKE 1 TABLET BY MOUTH EVERY DAY 90 tablet 3  . gabapentin (NEURONTIN) 100 MG capsule TAKE 1 CAPSULE BY MOUTH AT BEDTIME. 30 capsule 3   . hydroxyurea (HYDREA) 500 MG capsule TAKE ONE CAPSULE BY MOUTH EVERY DAY WITH FOOD TO MINIMIZE GI SIDE EFFECTS 90 capsule 3  . lidocaine-prilocaine (EMLA) cream Apply 1 application topically daily as needed (port access).     . Oxycodone HCl 10 MG TABS Take 1 tablet (10 mg total) by mouth every 4 (four) hours as needed. 60 tablet 0  . sildenafil (VIAGRA) 50 MG tablet Take 1 tablet (50 mg total) by mouth daily as needed for erectile dysfunction. Do not take same day as Isosorbide 10 tablet 0   No facility-administered medications prior to visit.    No Known Allergies  ROS Review  of Systems  Constitutional: Negative.   HENT: Negative.   Eyes: Positive for visual disturbance.  Respiratory: Negative.   Cardiovascular: Negative.   Gastrointestinal: Negative.   Endocrine: Negative.   Genitourinary: Negative.   Musculoskeletal: Positive for arthralgias and back pain.  Allergic/Immunologic: Negative.   Neurological: Negative.  Negative for dizziness, weakness, numbness and headaches.      Objective:    Physical Exam Constitutional:      Appearance: Normal appearance.  HENT:     Nose: Nose normal.  Cardiovascular:     Rate and Rhythm: Normal rate. Rhythm irregular.     Pulses: Normal pulses.  Pulmonary:     Effort: Pulmonary effort is normal.  Abdominal:     General: Abdomen is flat.  Musculoskeletal:     Lumbar back: Spasms present. No edema, signs of trauma or tenderness. Decreased range of motion.  Neurological:     Mental Status: He is alert.  Psychiatric:        Mood and Affect: Mood normal.        Behavior: Behavior normal.        Thought Content: Thought content normal.        Judgment: Judgment normal.     BP 130/88 (BP Location: Right Arm, Patient Position: Sitting, Cuff Size: Normal)   Pulse 80   Temp 98.4 F (36.9 C) (Oral)   Resp 14   Ht 5\' 11"  (1.803 m)   Wt 151 lb (68.5 kg)   SpO2 100%   BMI 21.06 kg/m  Wt Readings from Last 3 Encounters:   01/26/20 151 lb (68.5 kg)  12/29/19 164 lb 0.6 oz (74.4 kg)  10/23/19 148 lb 6.4 oz (67.3 kg)     There are no preventive care reminders to display for this patient.  There are no preventive care reminders to display for this patient.  Lab Results  Component Value Date   TSH 2.301 02/17/2015   Lab Results  Component Value Date   WBC 4.4 12/29/2019   HGB 8.9 (L) 12/29/2019   HCT 26.1 (L) 12/29/2019   MCV 124 (H) 12/29/2019   PLT 144 (L) 12/29/2019   Lab Results  Component Value Date   NA 137 12/29/2019   K 4.7 12/29/2019   CO2 25 12/29/2019   GLUCOSE 86 12/29/2019   BUN 51 (H) 12/29/2019   CREATININE 5.99 (H) 12/29/2019   BILITOT 0.9 12/29/2019   ALKPHOS 146 (H) 12/29/2019   AST 34 12/29/2019   ALT 22 12/29/2019   PROT 7.0 12/29/2019   ALBUMIN 4.0 12/29/2019   CALCIUM 8.5 (L) 12/29/2019   ANIONGAP 12 10/01/2019   GFR 46.63 (L) 01/08/2012   Lab Results  Component Value Date   CHOL 114 (L) 02/17/2015   Lab Results  Component Value Date   HDL 31 (L) 02/17/2015   Lab Results  Component Value Date   LDLCALC 68 02/17/2015   Lab Results  Component Value Date   TRIG 76 02/17/2015   Lab Results  Component Value Date   CHOLHDL 3.7 02/17/2015   Lab Results  Component Value Date   HGBA1C <4.0 06/07/2011      Assessment & Plan:   Problem List Items Addressed This Visit    None    Visit Diagnoses    Low back pain without sciatica, unspecified back pain laterality, unspecified chronicity    -  Primary   Relevant Medications   tiZANidine (ZANAFLEX) 4 MG tablet   Other Relevant Orders   Urinalysis  Dipstick (Completed)   Abnormal urinalysis       Relevant Orders   Urine Culture      Meds ordered this encounter  Medications  . tiZANidine (ZANAFLEX) 4 MG tablet    Sig: Take 0.5 tablets (2 mg total) by mouth every 8 (eight) hours as needed for muscle spasms.    Dispense:  30 tablet    Refill:  1    Order Specific Question:   Supervising Provider     Answer:   [name] [id number]   Low back pain without sciatica, unspecified back pain laterality, unspecified chronicity We will start a trial of tizanidine 2 mg every 8 hours as needed for muscle spasms.  Dose reduced to impaired renal function. - Urinalysis Dipstick - tiZANidine (ZANAFLEX) 4 MG tablet; Take 0.5 tablets (2 mg total) by mouth every 8 (eight) hours as needed for muscle spasms.  Dispense: 30 tablet; Refill: 1 Continue oxycodone 10 mg every 4 hours as needed for severe breakthrough pain.  Abnormal urinalysis  - Urine Culture  Follow-up: We will follow-up via televisit on 01/29/2020   [name]  APRN, MSN, FNP-C [facility] [address] [place], [place] [address] [phone]

## 2020-01-27 ENCOUNTER — Ambulatory Visit (HOSPITAL_COMMUNITY)
Admission: RE | Admit: 2020-01-27 | Discharge: 2020-01-27 | Disposition: A | Discharge status: Home / Self Care | Payer: HMO | Source: Ambulatory Visit | Attending: Internal Medicine | Admitting: Internal Medicine

## 2020-01-27 ENCOUNTER — Encounter (HOSPITAL_COMMUNITY): Payer: Self-pay | Admitting: Internal Medicine

## 2020-01-27 VITALS — BP 130/76 | HR 93 | Wt 155.4 lb

## 2020-01-27 DIAGNOSIS — I272 Pulmonary hypertension, unspecified: Secondary | ICD-10-CM | POA: Diagnosis not present

## 2020-01-27 DIAGNOSIS — Z8249 Family history of ischemic heart disease and other diseases of the circulatory system: Secondary | ICD-10-CM | POA: Insufficient documentation

## 2020-01-27 DIAGNOSIS — I10 Essential (primary) hypertension: Secondary | ICD-10-CM

## 2020-01-27 DIAGNOSIS — I5032 Chronic diastolic (congestive) heart failure: Secondary | ICD-10-CM | POA: Diagnosis not present

## 2020-01-27 DIAGNOSIS — I4819 Other persistent atrial fibrillation: Secondary | ICD-10-CM | POA: Diagnosis not present

## 2020-01-27 DIAGNOSIS — Z87891 Personal history of nicotine dependence: Secondary | ICD-10-CM | POA: Diagnosis not present

## 2020-01-27 DIAGNOSIS — N186 End stage renal disease: Secondary | ICD-10-CM | POA: Diagnosis not present

## 2020-01-27 DIAGNOSIS — I4891 Unspecified atrial fibrillation: Secondary | ICD-10-CM

## 2020-01-27 DIAGNOSIS — H548 Legal blindness, as defined in USA: Secondary | ICD-10-CM | POA: Diagnosis not present

## 2020-01-27 DIAGNOSIS — Z79899 Other long term (current) drug therapy: Secondary | ICD-10-CM | POA: Insufficient documentation

## 2020-01-27 DIAGNOSIS — M109 Gout, unspecified: Secondary | ICD-10-CM | POA: Insufficient documentation

## 2020-01-27 DIAGNOSIS — I132 Hypertensive heart and chronic kidney disease with heart failure and with stage 5 chronic kidney disease, or end stage renal disease: Secondary | ICD-10-CM | POA: Insufficient documentation

## 2020-01-27 DIAGNOSIS — K648 Other hemorrhoids: Secondary | ICD-10-CM | POA: Diagnosis not present

## 2020-01-27 DIAGNOSIS — Z7901 Long term (current) use of anticoagulants: Secondary | ICD-10-CM | POA: Insufficient documentation

## 2020-01-27 DIAGNOSIS — I5022 Chronic systolic (congestive) heart failure: Secondary | ICD-10-CM

## 2020-01-27 DIAGNOSIS — D571 Sickle-cell disease without crisis: Secondary | ICD-10-CM | POA: Diagnosis not present

## 2020-01-27 DIAGNOSIS — I5042 Chronic combined systolic (congestive) and diastolic (congestive) heart failure: Secondary | ICD-10-CM | POA: Insufficient documentation

## 2020-01-27 DIAGNOSIS — I428 Other cardiomyopathies: Secondary | ICD-10-CM | POA: Diagnosis not present

## 2020-01-27 DIAGNOSIS — Z992 Dependence on renal dialysis: Secondary | ICD-10-CM | POA: Diagnosis not present

## 2020-01-27 DIAGNOSIS — Z8719 Personal history of other diseases of the digestive system: Secondary | ICD-10-CM | POA: Insufficient documentation

## 2020-01-27 MED ORDER — HYDRALAZINE HCL 25 MG PO TABS
25.0000 mg | ORAL_TABLET | Freq: Two times a day (BID) | ORAL | 3 refills | Status: DC
Start: 2020-01-27 — End: 2021-06-10

## 2020-01-27 MED ORDER — ISOSORBIDE MONONITRATE ER 30 MG PO TB24
30.0000 mg | ORAL_TABLET | Freq: Every day | ORAL | 6 refills | Status: DC
Start: 2020-01-27 — End: 2020-08-25

## 2020-01-27 NOTE — Patient Instructions (Signed)
STOP Viagra  Start Hydralazine 25 mg Twice daily **DO NOT TAKE THE MORNING DOSE ON YOUR DIALYSIS DAYS**  Start Imdur 30 mg Daily **DO NOT TAKE BEFORE GOING TO DIALYSIS**  Your provider has recommended that  you wear a Zio Patch for 14 days.  This monitor will record your heart rhythm for our review.  IF you have any symptoms while wearing the monitor please press the button.  If you have any issues with the patch or you notice a red or orange light on it please call the company at [phone].  Once you remove the patch please mail it back to the company as soon as possible so we can get the results.  Your physician has requested that you have an echocardiogram. Echocardiography is a painless test that uses sound waves to create images of your heart. It provides your doctor with information about the size and shape of your heart and how well your heart's chambers and valves are working. This procedure takes approximately one hour. There are no restrictions for this procedure.  Your physician recommends that you schedule a follow-up appointment in: 4 months  If you have any questions or concerns before your next appointment please send [place] a message through [facility] or call our office at [phone].    TO LEAVE A MESSAGE FOR THE NURSE SELECT OPTION 2, PLEASE LEAVE A MESSAGE INCLUDING: . YOUR NAME . DATE OF BIRTH . CALL BACK NUMBER . REASON FOR CALL**this is important as we prioritize the call backs  [place] AS LONG AS YOU CALL BEFORE 4:00 PM  At the [facility], you and your health needs are our priority. As part of our continuing mission to provide you with exceptional heart care, we have created designated Provider Care Teams. These Care Teams include your primary Cardiologist (physician) and Advanced Practice Providers (APPs- Physician Assistants and Nurse Practitioners) who all work together to provide you with the care you need, when  you need it.   You may see any of the following providers on your designated Care Team at your next follow up: Marland Kitchen Dr Glori Bickers . Dr Loralie Champagne . [name], NP . Lyda Jester, PA . Audry Riles, PharmD   Please be sure to bring in all your medications bottles to every appointment.

## 2020-01-27 NOTE — Progress Notes (Signed)
Patient ID: Patrick [name], male   DOB: 1955/06/28, 64 y.o.   MRN: [id number]     Advanced Heart Failure Clinic Note   PCP: [name] : Dr Zigmund Yash Cacciola  Opthalmologist: [name] HF MD: Dr Haroldine Laws Nephrologist: Dr Moshe Cipro.   HPI: Little Patrick [name] is a 64 y.o. male  with a history of sickle cell anemia, systolic HF due to NICM EF 40% 11/2012 initially diagnosed 05/2011, no CAD by cath 05/2011, CKD stage III, legally blind, and tobacco abuse.   Admitted 7/6 through 7/61/6073 with A/C systolic heart failure and CKD in setting of new A fib. Started coumadin. Discharge weight was 135 pounds.   Admitted 4/3 - 09/24/17 with upper GI bleed and profound anemia with Hgb of 4.6 with Sickle Cell. Transfused with RBCs. EGD showed nonbleeding gastritis, biopsies were obtained. Pathology was pending at discharge. Colonoscopy showed small internal hemorrhoids, otherwise normal. .  Admitted 71/0626 with A/C systolic HF and sickle cell anemia. Diuresed with IV lasix and transitioned to torsemide 100 mg daily. Found to be back in Afib. Eliquis was continued. He was started on amio drip and underwent DCCV 12/19. Amio transitioned to PO. He required 1 uPRBCs for anemia. He also had chest pain with negative troponin and no EKG changes. Myoview showed no significant ischemia, EF 46%   Started HD in 05/2019   Admitted 9/48 for systolic HF w/ fluid overload. He had been compliant w/ HD but acute exacerbation related to dietary indiscretion w/ sodium. He was admitted and underwent an extra HD session and nephrology reduced his EDW goal. Volume improved w/ HD. EKG showed rate controlled afib. 2D echo showed LVEF 35-40%, low normal RV function,severe pulm HTN in setting of volume overload with RVSP 43mmHG. Severe LAE, mod RAE, mod MR, severe TR. RV TAPSE 1.6, RV s' 10.  On  10/06/19 he had DC-CV with restoration of NSR. Found to have recurrent AFL in 5/21. Referred to AF Clinic. Felt that patient was not  good candidate to restart amiodarone. Rate control strategy continued.   Here for HF follow up. Feeling ok. Continues to go to HD T/Thur/Sat. Breathing is "prety good". Occasional SOB. No edema, orthopnea or PND. Continues to work for [employer]. Says his BP has 'been good" but can't really quantify. In Epic looks like SBP 120-130. Has only had one episode of low BP in HD. Uses SCAT. Stopped smoking several years ago.   01/17/12 ECHO 55%-60%  11/20/12 ECHO EF 40% Peak PA pressure 56 11/30/13 ECHO 40-45%  12/2015: ECHO 40%.  07/2017: EF 40-45% 05/2018: EF 25-30% 08/2019: LVEF 35-40%, low normal RV function,severe pulm HTN.  SH: Lives alone. Uses SCAT. Smokes 8 cigarettes per day. Occasional ETOH and no drugs  FH: Mother deceased: liver failure, no CAD        Father deceased: CAD, MI  Review of systems complete and found to be negative unless listed in HPI.   Past Medical History:  Diagnosis Date  . Blood dyscrasia   . Blood transfusion    "I've had a bunch of them"  . CHF (congestive heart failure) ([place])    followed by hf clinic  . Chronic kidney disease (CKD), stage III (moderate)   . Dialysis patient ([place]) 04/2019   AV fistula (right arm)  . Gout   . NIOEVOJJ(009.3)    "probably weekly" (01/13/2014)  . Legally blind   . Shortness of breath dyspnea   . Sickle cell disease, type SS (  HCC)   . Swelling abdomen   . Swelling of extremity, left   . Swelling of extremity, right     Current Outpatient Medications  Medication Sig Dispense Refill  . acetaminophen (TYLENOL) 500 MG tablet Take 1,000 mg by mouth every 6 (six) hours as needed for moderate pain or headache.    . allopurinol (ZYLOPRIM) 100 MG tablet Take 100 mg by mouth 2 (two) times daily.    . carvedilol (COREG) 12.5 MG tablet TAKE 1 TABLET BY MOUTH 2 (TWO) TIMES DAILY WITH A MEAL. 180 tablet 2  . diphenhydramine-acetaminophen (TYLENOL PM) 25-500 MG TABS tablet Take 2 tablets by mouth at bedtime.    Marland Kitchen ELIQUIS 5 MG  TABS tablet TAKE 1 TABLET BY MOUTH TWICE A DAY 60 tablet 11  . ferric citrate (AURYXIA) 1 GM 210 MG(Fe) tablet Take 210 mg by mouth See admin instructions. Take 210 mg with each meal and snack    . folic acid (FOLVITE) 1 MG tablet TAKE 1 TABLET BY MOUTH EVERY DAY 90 tablet 3  . gabapentin (NEURONTIN) 100 MG capsule TAKE 1 CAPSULE BY MOUTH AT BEDTIME. 30 capsule 3  . hydroxyurea (HYDREA) 500 MG capsule TAKE ONE CAPSULE BY MOUTH EVERY DAY WITH FOOD TO MINIMIZE GI SIDE EFFECTS 90 capsule 3  . lidocaine-prilocaine (EMLA) cream Apply 1 application topically daily as needed (port access).     . Oxycodone HCl 10 MG TABS Take 1 tablet (10 mg total) by mouth every 4 (four) hours as needed. 60 tablet 0  . tiZANidine (ZANAFLEX) 4 MG tablet Take 0.5 tablets (2 mg total) by mouth every 8 (eight) hours as needed for muscle spasms. 30 tablet 1  . hydrALAZINE (APRESOLINE) 25 MG tablet Take 1 tablet (25 mg total) by mouth in the morning and at bedtime. DO NOT TAKE AM DOSES ON DIALYSIS DAYS 60 tablet 3  . isosorbide mononitrate (IMDUR) 30 MG 24 hr tablet Take 1 tablet (30 mg total) by mouth daily. DO NOT TAKE AM ON DIALYSIS DAYS. 30 tablet 6   No current facility-administered medications for this encounter.   Vitals:   01/27/20 1425  BP: 130/76  Pulse: 93  SpO2: 95%  Weight: 70.5 kg (155 lb 6.4 oz)     Wt Readings from Last 3 Encounters:  01/27/20 70.5 kg (155 lb 6.4 oz)  01/26/20 68.5 kg (151 lb)  12/29/19 74.4 kg (164 lb 0.6 oz)   PHYSICAL EXAM: General:  Well appearing. No resp difficulty HEENT: normal Neck: supple. no JVD. Carotids 2+ bilat; no bruits. No lymphadenopathy or thryomegaly appreciated. Cor: PMI nondisplaced. Irregular rate & rhythm. No rubs, gallops or murmurs. Lungs: clear with decreased BS throughout Abdomen: soft, nontender, nondistended. No hepatosplenomegaly. No bruits or masses. Good bowel sounds. Extremities: no cyanosis, clubbing, rash, edema Very large AVF in RUE with thrill    Neuro: alert & orientedx3, cranial nerves grossly intact. moves all 4 extremities w/o difficulty. Affect pleasant   EKG: Atrial fibrillation 96 Personally reviewed   ASSESSMENT & PLAN:  1. Chronic Combined Systolic/Diastolic Heart Failure:  - NICM. Cath 05/2011 without significant coronary disease.   - ECHO 12/2015 40%.  - Echo 05/2018: EF 25-30%.  - Echo 3/21 LVEF 35-40%, low normal RV function,severe pulm HTN. Severe LAE, mod RAE, mod MR, severe TR. RV TAPSE 1.6, RV s' 10 - NYHA II. Volume status look good - well managed with HD  -Continue coreg 12.5 BID. - Will try to restart Hydral/Imdur as BP tolerates. For compliance  reasons use hydralazine 25 bid and Imdur 30 daily (hold both on am of HD) -> instructed to stop sildenafil with Imdur - Off Entresto with ESRD  2. Pulmonary HTN  - last echo done in setting of volume overload so may be pulmonary venous HTN but I am concerned for high out PAH in setting of large AVF. May also have component due to SSCD - Repeat echo  - If RVSP up or signs of RV strain will need RHC  3. HTN: - BPcontrolled   4  Sickle Cell:   - Follows closely with Sickle Cell Clinic.  - no change  5. Former smoker:   - Stopped 11/2015. - Discussed smoking cessation.   6. ESRD - On HD T/TH/Sat  7. Persistent Atrial Fibrillation/AFL -  s/p DCCV 05/2018 - S/P DCCV 10/06/19 NSR - This patients CHA2DS2-VASc is at least 3.  - has been seen in AF Clinic and rate control strategy recommended - he is toelrating well - HR a bit high today. Will check Zio to assess rate control - Continue carvedilol and Eliquis  8. H/o GI bleeding: - Prior EGD in 2019 showed nonbleeding gastritis, Colonoscopy showed small internal hemorrhoids, otherwise normal.  No recent bleeding issues.  - Continue Eliquis    Glori Bickers, MD  01/27/2020

## 2020-01-28 LAB — URINE CULTURE: Organism ID, Bacteria: NO GROWTH

## 2020-01-29 ENCOUNTER — Other Ambulatory Visit: Payer: Self-pay

## 2020-01-29 ENCOUNTER — Encounter: Payer: Self-pay | Admitting: Family Medicine

## 2020-01-29 ENCOUNTER — Telehealth (INDEPENDENT_AMBULATORY_CARE_PROVIDER_SITE_OTHER): Payer: HMO | Admitting: Family Medicine

## 2020-01-29 DIAGNOSIS — M62830 Muscle spasm of back: Secondary | ICD-10-CM | POA: Insufficient documentation

## 2020-01-29 DIAGNOSIS — M545 Low back pain, unspecified: Secondary | ICD-10-CM

## 2020-01-29 NOTE — Progress Notes (Signed)
Virtual Visit via Telephone Note Provider location: [place] patient care center Patient location: Home   I connected with Linn D Zinn on 01/29/20 at 10:20 AM EDT by telephone and verified that I am speaking with the correct person using two identifiers.   I discussed the limitations, risks, security and privacy concerns of performing an evaluation and management service by telephone and the availability of in person appointments. I also discussed with the patient that there may be a patient responsible charge related to this service. The patient expressed understanding and agreed to proceed.   History of Present Illness: Gilad Champine is a 64 year old male with a medical history significant for sickle cell disease, end-stage renal disease on dialysis, chronic heart failure with preserved EF, history of anemia of chronic disease, and tobacco dependence who is legally blind presents via telephone for a follow-up of low back pain with muscle spasms for greater than 1 week.  Patient was started on Zanaflex 2 mg every 8 hours as needed.  He says that muscle spasm have improved some.  However, he continues to have discomfort when transitioning from lying or sitting to standing.  He feels that his back is "tightening" with any strain or movement.  He says that as long as he is lying down, back pain is well controlled.  He continues to take oxycodone 10 mg around every 4-6 hours for increased pain.  He denies any falls, weakness, dizziness, chest pain, shortness of breath, urinary symptoms, nausea, vomiting, or diarrhea.   Past Medical History:  Diagnosis Date  . Blood dyscrasia   . Blood transfusion    "I've had a bunch of them"  . CHF (congestive heart failure) ([place])    followed by hf clinic  . Chronic kidney disease (CKD), stage III (moderate)   . Dialysis patient ([place]) 04/2019   AV fistula (right arm)  . Gout   . TDDUKGUR(427.0)    "probably weekly" (01/13/2014)  . Legally blind    . Shortness of breath dyspnea   . Sickle cell disease, type SS ([place])   . Swelling abdomen   . Swelling of extremity, left   . Swelling of extremity, right    Social History   Socioeconomic History  . Marital status: Divorced    Spouse name: Not on file  . Number of children: Not on file  . Years of education: Not on file  . Highest education level: Not on file  Occupational History  . Not on file  Tobacco Use  . Smoking status: Former Smoker    Packs/day: 0.25    Years: 41.00    Pack years: 10.25    Types: Cigarettes    Quit date: 12/08/2015    Years since quitting: 4.1  . Smokeless tobacco: Never Used  Vaping Use  . Vaping Use: Never used  Substance and Sexual Activity  . Alcohol use: Yes    Alcohol/week: 3.0 standard drinks    Types: 1 Glasses of wine, 2 Cans of beer per week    Comment: occasional  . Drug use: No  . Sexual activity: Not Currently  Other Topics Concern  . Not on file  Social History Narrative  . Not on file   Social Determinants of Health   Financial Resource Strain:   . Difficulty of Paying Living Expenses:   Food Insecurity:   . Worried About Charity fundraiser in the Last Year:   . [place] in the Last Year:  Transportation Needs: No Transportation Needs  . Lack of Transportation (Medical): No  . Lack of Transportation (Non-Medical): No  Physical Activity: Sufficiently Active  . Days of Exercise per Week: 5 days  . Minutes of Exercise per Session: 90 min  Stress:   . Feeling of Stress :   Social Connections:   . Frequency of Communication with Friends and Family:   . Frequency of Social Gatherings with Friends and Family:   . Attends Religious Services:   . Active Member of Clubs or Organizations:   . Attends Archivist Meetings:   Marland Kitchen Marital Status:   Intimate Partner Violence:   . Fear of Current or Ex-Partner:   . Emotionally Abused:   Marland Kitchen Physically Abused:   . Sexually Abused:     Observations/Objective:   Assessment and Plan:  Low back pain at multiple sites Continue oxycodone 10 mg every 4 hours as needed for severe back pain.  Patient warrants imaging at this juncture to rule out any acute abnormalities.  Will review imaging as results become available. - DG Lumbar Spine Complete; Future  Muscle spasm of back Patient states that muscle spasms have improved some with Zanaflex 2 mg every 8 hours, continue as needed. - DG Lumbar Spine Complete; Future Follow Up Instructions:    I discussed the assessment and treatment plan with the patient. The patient was provided an opportunity to ask questions and all were answered. The patient agreed with the plan and demonstrated an understanding of the instructions.   The patient was advised to call back or seek an in-person evaluation if the symptoms worsen or if the condition fails to improve as anticipated.  I provided 10 minutes of non-face-to-face time during this encounter.  [name]  APRN, MSN, FNP-C [facility] [address] [place], [place] [address] [phone]

## 2020-02-04 ENCOUNTER — Other Ambulatory Visit: Payer: Self-pay | Admitting: Internal Medicine

## 2020-02-04 DIAGNOSIS — D571 Sickle-cell disease without crisis: Secondary | ICD-10-CM

## 2020-02-16 DIAGNOSIS — Z992 Dependence on renal dialysis: Secondary | ICD-10-CM | POA: Diagnosis not present

## 2020-02-16 DIAGNOSIS — I158 Other secondary hypertension: Secondary | ICD-10-CM | POA: Diagnosis not present

## 2020-02-16 DIAGNOSIS — N186 End stage renal disease: Secondary | ICD-10-CM | POA: Diagnosis not present

## 2020-02-17 ENCOUNTER — Ambulatory Visit (HOSPITAL_COMMUNITY)
Admission: RE | Admit: 2020-02-17 | Discharge: 2020-02-17 | Disposition: A | Discharge status: Home / Self Care | Payer: HMO | Source: Ambulatory Visit | Attending: Internal Medicine | Admitting: Internal Medicine

## 2020-02-17 ENCOUNTER — Other Ambulatory Visit: Payer: Self-pay

## 2020-02-17 DIAGNOSIS — D571 Sickle-cell disease without crisis: Secondary | ICD-10-CM | POA: Diagnosis not present

## 2020-02-17 DIAGNOSIS — F17200 Nicotine dependence, unspecified, uncomplicated: Secondary | ICD-10-CM | POA: Insufficient documentation

## 2020-02-17 DIAGNOSIS — I429 Cardiomyopathy, unspecified: Secondary | ICD-10-CM | POA: Diagnosis not present

## 2020-02-17 DIAGNOSIS — N189 Chronic kidney disease, unspecified: Secondary | ICD-10-CM | POA: Diagnosis not present

## 2020-02-17 DIAGNOSIS — I5032 Chronic diastolic (congestive) heart failure: Secondary | ICD-10-CM | POA: Insufficient documentation

## 2020-02-17 DIAGNOSIS — I083 Combined rheumatic disorders of mitral, aortic and tricuspid valves: Secondary | ICD-10-CM | POA: Insufficient documentation

## 2020-02-17 LAB — ECHOCARDIOGRAM COMPLETE
Area-P 1/2: 3.17 cm2
S' Lateral: 5.2 cm

## 2020-02-17 NOTE — Progress Notes (Signed)
  Echocardiogram 2D Echocardiogram has been performed.  Patrick [name] 02/17/2020, 9:34 AM

## 2020-02-18 DIAGNOSIS — D631 Anemia in chronic kidney disease: Secondary | ICD-10-CM | POA: Diagnosis not present

## 2020-02-18 DIAGNOSIS — D689 Coagulation defect, unspecified: Secondary | ICD-10-CM | POA: Diagnosis not present

## 2020-02-18 DIAGNOSIS — Z992 Dependence on renal dialysis: Secondary | ICD-10-CM | POA: Diagnosis not present

## 2020-02-18 DIAGNOSIS — E876 Hypokalemia: Secondary | ICD-10-CM | POA: Diagnosis not present

## 2020-02-18 DIAGNOSIS — D509 Iron deficiency anemia, unspecified: Secondary | ICD-10-CM | POA: Diagnosis not present

## 2020-02-18 DIAGNOSIS — N186 End stage renal disease: Secondary | ICD-10-CM | POA: Diagnosis not present

## 2020-02-18 DIAGNOSIS — N2581 Secondary hyperparathyroidism of renal origin: Secondary | ICD-10-CM | POA: Diagnosis not present

## 2020-02-22 DIAGNOSIS — I4891 Unspecified atrial fibrillation: Secondary | ICD-10-CM | POA: Diagnosis not present

## 2020-02-23 NOTE — Addendum Note (Signed)
Encounter addended by: Micki Riley, RN on: 02/23/2020 12:47 PM  Actions taken: Imaging Exam ended

## 2020-02-29 ENCOUNTER — Telehealth: Payer: Self-pay | Admitting: Family Medicine

## 2020-02-29 ENCOUNTER — Other Ambulatory Visit: Payer: Self-pay | Admitting: Family Medicine

## 2020-02-29 DIAGNOSIS — G894 Chronic pain syndrome: Secondary | ICD-10-CM

## 2020-02-29 MED ORDER — OXYCODONE HCL 10 MG PO TABS: 10.0000 mg | ORAL_TABLET | ORAL | 0 refills | Status: DC | PRN

## 2020-02-29 NOTE — Progress Notes (Signed)
Reviewed PDMP substance reporting system prior to prescribing opiate medications. No inconsistencies noted.  Meds ordered this encounter  Medications   Oxycodone HCl 10 MG TABS    Sig: Take 1 tablet (10 mg total) by mouth every 4 (four) hours as needed.    Dispense:  60 tablet    Refill:  0    Order Specific Question:   Supervising Provider    Answer:   JEGEDE, OLUGBEMIGA E [[id number]]   Patrick Patman Moore Montrelle Eddings  APRN, MSN, FNP-C [facility] [address]  [place], [place] [address] [phone]  

## 2020-03-01 NOTE — Telephone Encounter (Signed)
Done

## 2020-03-17 ENCOUNTER — Telehealth: Payer: Self-pay | Admitting: Family Medicine

## 2020-03-17 DIAGNOSIS — Z992 Dependence on renal dialysis: Secondary | ICD-10-CM | POA: Diagnosis not present

## 2020-03-17 DIAGNOSIS — I158 Other secondary hypertension: Secondary | ICD-10-CM | POA: Diagnosis not present

## 2020-03-17 DIAGNOSIS — N186 End stage renal disease: Secondary | ICD-10-CM | POA: Diagnosis not present

## 2020-03-19 DIAGNOSIS — D509 Iron deficiency anemia, unspecified: Secondary | ICD-10-CM | POA: Diagnosis not present

## 2020-03-19 DIAGNOSIS — D631 Anemia in chronic kidney disease: Secondary | ICD-10-CM | POA: Diagnosis not present

## 2020-03-19 DIAGNOSIS — Z992 Dependence on renal dialysis: Secondary | ICD-10-CM | POA: Diagnosis not present

## 2020-03-19 DIAGNOSIS — D689 Coagulation defect, unspecified: Secondary | ICD-10-CM | POA: Diagnosis not present

## 2020-03-19 DIAGNOSIS — E876 Hypokalemia: Secondary | ICD-10-CM | POA: Diagnosis not present

## 2020-03-19 DIAGNOSIS — N2581 Secondary hyperparathyroidism of renal origin: Secondary | ICD-10-CM | POA: Diagnosis not present

## 2020-03-19 DIAGNOSIS — N186 End stage renal disease: Secondary | ICD-10-CM | POA: Diagnosis not present

## 2020-03-21 NOTE — Telephone Encounter (Signed)
Done

## 2020-03-29 ENCOUNTER — Telehealth: Payer: Self-pay | Admitting: Family Medicine

## 2020-03-29 ENCOUNTER — Other Ambulatory Visit: Payer: Self-pay | Admitting: Family Medicine

## 2020-03-29 DIAGNOSIS — M545 Low back pain, unspecified: Secondary | ICD-10-CM

## 2020-03-29 DIAGNOSIS — G894 Chronic pain syndrome: Secondary | ICD-10-CM

## 2020-03-29 MED ORDER — OXYCODONE HCL 10 MG PO TABS: 10.0000 mg | ORAL_TABLET | ORAL | 0 refills | Status: DC | PRN

## 2020-03-29 MED ORDER — TIZANIDINE HCL 4 MG PO TABS: 2.0000 mg | ORAL_TABLET | Freq: Three times a day (TID) | ORAL | 1 refills | Status: DC | PRN

## 2020-03-29 NOTE — Progress Notes (Signed)
Reviewed PDMP substance reporting system prior to prescribing opiate medications. No inconsistencies noted.   Meds ordered this encounter  Medications  . Oxycodone HCl 10 MG TABS    Sig: Take 1 tablet (10 mg total) by mouth every 4 (four) hours as needed.    Dispense:  60 tablet    Refill:  0    Order Specific Question:   Supervising Provider    Answer:   [name] [id number]  . tiZANidine (ZANAFLEX) 4 MG tablet    Sig: Take 0.5 tablets (2 mg total) by mouth every 8 (eight) hours as needed for muscle spasms.    Dispense:  30 tablet    Refill:  1    Order Specific Question:   Supervising Provider    Answer:   [name] [[id number]]    [name]  APRN, MSN, FNP-C [facility] [address] [place], [place] [address] [phone]

## 2020-03-30 ENCOUNTER — Telehealth (HOSPITAL_COMMUNITY): Payer: Self-pay | Admitting: *Deleted

## 2020-03-30 NOTE — Telephone Encounter (Signed)
Done

## 2020-03-30 NOTE — Telephone Encounter (Addendum)
Pt left VM stating dialysis is not pulling off enough fluid and he's only urinating once daily. Pt asks that we prescribe a diuretic. Per Adline Potter follow up with Dr.Bensimhon.   Routed to [place] for advice

## 2020-04-01 NOTE — Telephone Encounter (Signed)
I talked to [facility]. Asked to do HD 4x per week/

## 2020-04-05 ENCOUNTER — Ambulatory Visit: Payer: HMO | Admitting: Family Medicine

## 2020-04-06 ENCOUNTER — Institutional Professional Consult (permissible substitution): Payer: HMO | Admitting: Pulmonary Disease

## 2020-04-12 ENCOUNTER — Ambulatory Visit: Payer: Self-pay

## 2020-04-17 DIAGNOSIS — N186 End stage renal disease: Secondary | ICD-10-CM | POA: Diagnosis not present

## 2020-04-17 DIAGNOSIS — Z992 Dependence on renal dialysis: Secondary | ICD-10-CM | POA: Diagnosis not present

## 2020-04-17 DIAGNOSIS — I158 Other secondary hypertension: Secondary | ICD-10-CM | POA: Diagnosis not present

## 2020-04-18 DIAGNOSIS — N186 End stage renal disease: Secondary | ICD-10-CM | POA: Diagnosis not present

## 2020-04-18 DIAGNOSIS — D509 Iron deficiency anemia, unspecified: Secondary | ICD-10-CM | POA: Diagnosis not present

## 2020-04-18 DIAGNOSIS — D631 Anemia in chronic kidney disease: Secondary | ICD-10-CM | POA: Diagnosis not present

## 2020-04-18 DIAGNOSIS — D689 Coagulation defect, unspecified: Secondary | ICD-10-CM | POA: Diagnosis not present

## 2020-04-18 DIAGNOSIS — Z992 Dependence on renal dialysis: Secondary | ICD-10-CM | POA: Diagnosis not present

## 2020-04-18 DIAGNOSIS — E876 Hypokalemia: Secondary | ICD-10-CM | POA: Diagnosis not present

## 2020-04-18 DIAGNOSIS — N2581 Secondary hyperparathyroidism of renal origin: Secondary | ICD-10-CM | POA: Diagnosis not present

## 2020-04-19 ENCOUNTER — Telehealth (INDEPENDENT_AMBULATORY_CARE_PROVIDER_SITE_OTHER): Payer: HMO | Admitting: Family Medicine

## 2020-04-19 DIAGNOSIS — D571 Sickle-cell disease without crisis: Secondary | ICD-10-CM

## 2020-04-19 DIAGNOSIS — N186 End stage renal disease: Secondary | ICD-10-CM | POA: Diagnosis not present

## 2020-04-19 DIAGNOSIS — G894 Chronic pain syndrome: Secondary | ICD-10-CM | POA: Diagnosis not present

## 2020-04-19 MED ORDER — OXYCODONE HCL 10 MG PO TABS: 10.0000 mg | ORAL_TABLET | ORAL | 0 refills | Status: DC | PRN

## 2020-04-19 NOTE — Progress Notes (Signed)
Virtual Visit via Telephone Note  I connected with [name] on 04/19/20 at  2:40 PM EDT by telephone and verified that I am speaking with the correct person using two identifiers.  Location: Patient: Home Provider: [facility] [place]   I discussed the limitations, risks, security and privacy concerns of performing an evaluation and management service by telephone and the availability of in person appointments. I also discussed with the patient that there may be a patient responsible charge related to this service. The patient expressed understanding and agreed to proceed.   History of Present Illness: Patrick [name] is a 64 year old male with a medical history significant for sickle cell disease, chronic pain syndrome, opiate dependence and tolerance, history of chronic heart failure with preserved EF, end-stage renal disease on dialysis, and history of atrial fibrillation on Eliquis presents via telephone for follow-up of chronic pain.  Patient states that pain has been increased over the past several weeks.  He attributes increase in sickle cell related pain to changes in weather.  Pain is primarily to low back and lower extremities.  Patient states that he has been attempting to manage pain at home without much success.  His pain intensity today is 3/10.  He says that he has taken the last several days off from work and has been able to rest and hydrate, which is improved pain.  However, he had some increased swelling related to increase hydration.  Patient contacted cardiologist who restarted diuretic. Patient denies headache, chest pain, urinary symptoms, nausea, vomiting, or diarrhea.   Past Medical History:  Diagnosis Date  . Blood dyscrasia   . Blood transfusion    "I've had a bunch of them"  . CHF (congestive heart failure) ([place])    followed by hf clinic  . Chronic kidney disease (CKD), stage III (moderate)   . Dialysis patient ([place]) 04/2019   AV fistula  (right arm)  . Gout   . JSEGBTDV(761.6)    "probably weekly" (01/13/2014)  . Legally blind   . Shortness of breath dyspnea   . Sickle cell disease, type SS ([place])   . Swelling abdomen   . Swelling of extremity, left   . Swelling of extremity, right    Social History   Socioeconomic History  . Marital status: Divorced    Spouse name: Not on file  . Number of children: Not on file  . Years of education: Not on file  . Highest education level: Not on file  Occupational History  . Not on file  Tobacco Use  . Smoking status: Former Smoker    Packs/day: 0.25    Years: 41.00    Pack years: 10.25    Types: Cigarettes    Quit date: 12/08/2015    Years since quitting: 4.3  . Smokeless tobacco: Never Used  Vaping Use  . Vaping Use: Never used  Substance and Sexual Activity  . Alcohol use: Yes    Alcohol/week: 3.0 standard drinks    Types: 1 Glasses of wine, 2 Cans of beer per week    Comment: occasional  . Drug use: No  . Sexual activity: Not Currently  Other Topics Concern  . Not on file  Social History Narrative  . Not on file   Social Determinants of Health   Financial Resource Strain:   . Difficulty of Paying Living Expenses: Not on file  Food Insecurity:   . Worried About Charity fundraiser in the Last Year: Not on file  .  Ran Out of Food in the Last Year: Not on file  Transportation Needs: No Transportation Needs  . Lack of Transportation (Medical): No  . Lack of Transportation (Non-Medical): No  Physical Activity: Sufficiently Active  . Days of Exercise per Week: 5 days  . Minutes of Exercise per Session: 90 min  Stress:   . Feeling of Stress : Not on file  Social Connections:   . Frequency of Communication with Friends and Family: Not on file  . Frequency of Social Gatherings with Friends and Family: Not on file  . Attends Religious Services: Not on file  . Active Member of Clubs or Organizations: Not on file  . Attends Archivist Meetings: Not on  file  . Marital Status: Not on file  Intimate Partner Violence:   . Fear of Current or Ex-Partner: Not on file  . Emotionally Abused: Not on file  . Physically Abused: Not on file  . Sexually Abused: Not on file   Immunization History  Administered Date(s) Administered  . Influenza Inj Mdck Quad Pf 03/21/2018  . Influenza,inj,Quad PF,6+ Mos 03/17/2013, 03/08/2014  . PFIZER SARS-COV-2 Vaccination 10/01/2019, 10/26/2019  . Pneumococcal Conjugate-13 12/02/2014  . Pneumococcal Polysaccharide-23 02/10/2013  . Tdap 09/24/2013    Assessment and Plan: 1. Hb-SS disease without crisis [facility]) Patient will follow up in 3 months for labs.  Continue folic acid and hydroxyurea as previously prescribed.   2. Chronic pain syndrome Reviewed PDMP substance reporting system prior to prescribing opiate medications. No inconsistencies noted.    - Oxycodone HCl 10 MG TABS; Take 1 tablet (10 mg total) by mouth every 4 (four) hours as needed.  Dispense: 60 tablet; Refill: 0  3. End stage renal disease ([place]) Continue dialysis as scheduled.    Follow Up Instructions:    I discussed the assessment and treatment plan with the patient. The patient was provided an opportunity to ask questions and all were answered. The patient agreed with the plan and demonstrated an understanding of the instructions.   The patient was advised to call back or seek an in-person evaluation if the symptoms worsen or if the condition fails to improve as anticipated.  I provided 10 minutes of non-face-to-face time during this encounter.  [name]  APRN, MSN, FNP-C Patient [facility] [address] [place], [place] [address] [phone]

## 2020-05-09 ENCOUNTER — Ambulatory Visit (INDEPENDENT_AMBULATORY_CARE_PROVIDER_SITE_OTHER): Payer: HMO | Admitting: Pulmonary Disease

## 2020-05-09 ENCOUNTER — Other Ambulatory Visit: Payer: Self-pay | Admitting: Family Medicine

## 2020-05-09 ENCOUNTER — Encounter: Payer: Self-pay | Admitting: Pulmonary Disease

## 2020-05-09 ENCOUNTER — Telehealth: Payer: Self-pay | Admitting: Family Medicine

## 2020-05-09 ENCOUNTER — Other Ambulatory Visit: Payer: Self-pay

## 2020-05-09 VITALS — BP 112/72 | HR 82 | Temp 98.5°F | Ht 71.0 in | Wt 153.4 lb

## 2020-05-09 DIAGNOSIS — I2729 Other secondary pulmonary hypertension: Secondary | ICD-10-CM

## 2020-05-09 DIAGNOSIS — G894 Chronic pain syndrome: Secondary | ICD-10-CM

## 2020-05-09 DIAGNOSIS — R0600 Dyspnea, unspecified: Secondary | ICD-10-CM | POA: Diagnosis not present

## 2020-05-09 DIAGNOSIS — J449 Chronic obstructive pulmonary disease, unspecified: Secondary | ICD-10-CM

## 2020-05-09 DIAGNOSIS — R0609 Other forms of dyspnea: Secondary | ICD-10-CM

## 2020-05-09 MED ORDER — ALBUTEROL SULFATE HFA 108 (90 BASE) MCG/ACT IN AERS
2.0000 | INHALATION_SPRAY | Freq: Four times a day (QID) | RESPIRATORY_TRACT | 6 refills | Status: DC | PRN
Start: 2020-05-09 — End: 2021-08-30

## 2020-05-09 MED ORDER — OXYCODONE HCL 10 MG PO TABS: 10.0000 mg | ORAL_TABLET | ORAL | 0 refills | Status: DC | PRN

## 2020-05-09 NOTE — Progress Notes (Signed)
Synopsis: Referred by Roderic Palau, NP for abnormal PFTs  Subjective:   PATIENT ID: Patrick [name] GENDER: male DOB: [birth year], MRN: [id number]   HPI  Chief Complaint  Patient presents with  . Consult    abnornal PFT   Patrick [name] is a 64 year old male, former smoker and legally blind with sickle cell anemia, ESRD on HD, atrial fibrillation, pulmonary hypertension and non-ischemic cardiomyopathy who is referred to pulmonary clinic for shortness of breath and abnormal pulmonary function tests.  He reports progressive exertional dyspnea over the past year. He denies cough, wheezing or chest tightness with the dyspnea. He quit smoking in 2017 and has about a 10-15 pack year smoking history. He did grow up with second hand smoke exposure. He has not been on inhaler therapy in the past.    He had a hospital admission in 08/2019 for heart failure exacerbation and underwent DC- cardioversion in 09/2019 for atrial fibrillation but was found to be back in atrial fibrillation in 10/2019 that is being treated with rate control.   Most recent Echo shows EF 40-45% with moderately dilated LV. RV systolic function is mildly reduced and size is mildly enlarged. RVSP is 75.72mmHg. Left and right atria are severely dilated.       Pulmonary function tests show FEV1/FVC ratio 58, FVC 2.98L (71%), FEV1 1.72L (53%) with 18% improvement with bronchodilators. TLC 7.06L (98%), RV 4.38L (189%) and DLCO 53%.   He was previously on sildenafil for the pulmonary hypertension component but this was discontinued as he was started on Imdur in 01/2020 for heart failure.  Past Medical History:  Diagnosis Date  . Blood dyscrasia   . Blood transfusion    "I've had a bunch of them"  . CHF (congestive heart failure) ([place])    followed by hf clinic  . Chronic kidney disease (CKD), stage III (moderate) (HCC)   . Dialysis patient ([place]) 04/2019   AV fistula (right arm)  . Gout   . FBPZWCHE(527.7)    "probably  weekly" (01/13/2014)  . Legally blind   . Shortness of breath dyspnea   . Sickle cell disease, type SS ([place])   . Swelling abdomen   . Swelling of extremity, left   . Swelling of extremity, right      Family History  Problem Relation Age of Onset  . Alcohol abuse Mother   . Liver disease Mother   . Cirrhosis Mother   . Heart attack Mother      Social History   Socioeconomic History  . Marital status: Divorced    Spouse name: Not on file  . Number of children: Not on file  . Years of education: Not on file  . Highest education level: Not on file  Occupational History  . Not on file  Tobacco Use  . Smoking status: Former Smoker    Packs/day: 0.25    Years: 41.00    Pack years: 10.25    Types: Cigarettes    Quit date: 12/08/2015    Years since quitting: 4.4  . Smokeless tobacco: Never Used  Vaping Use  . Vaping Use: Never used  Substance and Sexual Activity  . Alcohol use: Yes    Alcohol/week: 3.0 standard drinks    Types: 1 Glasses of wine, 2 Cans of beer per week    Comment: occasional  . Drug use: No  . Sexual activity: Not Currently  Other Topics Concern  . Not on file  Social History Narrative  .  Not on file   Social Determinants of Health   Financial Resource Strain:   . Difficulty of Paying Living Expenses: Not on file  Food Insecurity:   . Worried About Charity fundraiser in the Last Year: Not on file  . Ran Out of Food in the Last Year: Not on file  Transportation Needs: No Transportation Needs  . Lack of Transportation (Medical): No  . Lack of Transportation (Non-Medical): No  Physical Activity: Sufficiently Active  . Days of Exercise per Week: 5 days  . Minutes of Exercise per Session: 90 min  Stress:   . Feeling of Stress : Not on file  Social Connections:   . Frequency of Communication with Friends and Family: Not on file  . Frequency of Social Gatherings with Friends and Family: Not on file  . Attends Religious Services: Not on file  .  Active Member of Clubs or Organizations: Not on file  . Attends Archivist Meetings: Not on file  . Marital Status: Not on file  Intimate Partner Violence:   . Fear of Current or Ex-Partner: Not on file  . Emotionally Abused: Not on file  . Physically Abused: Not on file  . Sexually Abused: Not on file     No Known Allergies   Outpatient Medications Prior to Visit  Medication Sig Dispense Refill  . acetaminophen (TYLENOL) 500 MG tablet Take 1,000 mg by mouth every 6 (six) hours as needed for moderate pain or headache.    . carvedilol (COREG) 12.5 MG tablet TAKE 1 TABLET BY MOUTH 2 (TWO) TIMES DAILY WITH A MEAL. 180 tablet 2  . diphenhydramine-acetaminophen (TYLENOL PM) 25-500 MG TABS tablet Take 2 tablets by mouth at bedtime.    Marland Kitchen ELIQUIS 5 MG TABS tablet TAKE 1 TABLET BY MOUTH TWICE A DAY 60 tablet 11  . ferric citrate (AURYXIA) 1 GM 210 MG(Fe) tablet Take 210 mg by mouth See admin instructions. Take 210 mg with each meal and snack    . hydroxyurea (HYDREA) 500 MG capsule TAKE 1 CAPSULE BY MOUTH EVERY DAY WITH FOOD TO MINIMIZE GI SIDE EFFECTS 90 capsule 3  . isosorbide mononitrate (IMDUR) 30 MG 24 hr tablet Take 1 tablet (30 mg total) by mouth daily. DO NOT TAKE AM ON DIALYSIS DAYS. 30 tablet 6  . lidocaine-prilocaine (EMLA) cream Apply 1 application topically daily as needed (port access).     . Oxycodone HCl 10 MG TABS Take 1 tablet (10 mg total) by mouth every 4 (four) hours as needed. 60 tablet 0  . allopurinol (ZYLOPRIM) 100 MG tablet Take 100 mg by mouth 2 (two) times daily.    . folic acid (FOLVITE) 1 MG tablet TAKE 1 TABLET BY MOUTH EVERY DAY 90 tablet 3  . gabapentin (NEURONTIN) 100 MG capsule TAKE 1 CAPSULE BY MOUTH AT BEDTIME. 30 capsule 3  . tiZANidine (ZANAFLEX) 4 MG tablet Take 0.5 tablets (2 mg total) by mouth every 8 (eight) hours as needed for muscle spasms. 30 tablet 1  . hydrALAZINE (APRESOLINE) 25 MG tablet Take 1 tablet (25 mg total) by mouth in the morning  and at bedtime. DO NOT TAKE AM DOSES ON DIALYSIS DAYS 60 tablet 3   No facility-administered medications prior to visit.    Review of Systems  Constitutional: Negative for chills, fever and weight loss.  HENT: Negative for congestion, sinus pain and sore throat.   Eyes:       Macular degeneration, legally blind  Respiratory: Positive  for shortness of breath. Negative for cough, hemoptysis, sputum production and wheezing.   Cardiovascular: Negative for chest pain, leg swelling and PND.  Gastrointestinal: Negative for abdominal pain, heartburn, nausea and vomiting.  Genitourinary: Negative for hematuria.  Musculoskeletal: Negative for joint pain.  Skin: Negative for rash.  Neurological: Negative for dizziness, weakness and headaches.  Endo/Heme/Allergies: Does not bruise/bleed easily.   Objective:   Vitals:   05/09/20 1621  BP: 112/72  Pulse: 82  Temp: 98.5 F (36.9 C)  TempSrc: Oral  SpO2: 100%  Weight: 153 lb 6.4 oz (69.6 kg)  Height: 5\' 11"  (1.803 m)     Physical Exam Constitutional:      General: He is not in acute distress.    Appearance: He is normal weight. He is not ill-appearing.  HENT:     Head: Normocephalic and atraumatic.     Nose: Nose normal.     Mouth/Throat:     Mouth: Mucous membranes are moist.     Pharynx: Oropharynx is clear.  Eyes:     General: No scleral icterus.    Conjunctiva/sclera: Conjunctivae normal.     Pupils: Pupils are equal, round, and reactive to light.  Cardiovascular:     Rate and Rhythm: Normal rate. Rhythm irregular.     Pulses: Normal pulses.     Heart sounds: Normal heart sounds.  Pulmonary:     Effort: Pulmonary effort is normal.     Breath sounds: Decreased air movement (throughout) present. Decreased breath sounds (throughout) and rales (faint, bilateral bases) present. No wheezing or rhonchi.  Abdominal:     General: Bowel sounds are normal.     Palpations: Abdomen is soft.  Musculoskeletal:     Cervical back: Neck  supple.     Right lower leg: No edema.     Left lower leg: No edema.  Lymphadenopathy:     Cervical: No cervical adenopathy.  Skin:    General: Skin is warm and dry.  Neurological:     General: No focal deficit present.     Mental Status: He is alert.  Psychiatric:        Mood and Affect: Mood normal.        Behavior: Behavior normal.        Thought Content: Thought content normal.        Judgment: Judgment normal.    CBC    Component Value Date/Time   WBC 4.4 12/29/2019 1425   WBC 5.0 10/01/2019 1458   RBC 2.10 (LL) 12/29/2019 1425   RBC 2.02 (L) 10/01/2019 1458   HGB 8.9 (L) 12/29/2019 1425   HCT 26.1 (L) 12/29/2019 1425   PLT 144 (L) 12/29/2019 1425   MCV 124 (H) 12/29/2019 1425   MCH 42.4 (H) 12/29/2019 1425   MCH 43.1 (H) 10/01/2019 1458   MCHC 34.1 12/29/2019 1425   MCHC 37.2 (H) 10/01/2019 1458   RDW 15.2 12/29/2019 1425   LYMPHSABS 1.1 12/29/2019 1425   MONOABS 1.0 09/12/2019 0426   EOSABS 0.3 12/29/2019 1425   BASOSABS 0.1 12/29/2019 1425   BMP Latest Ref Rng & Units 12/29/2019 10/01/2019 09/29/2019  Glucose 65 - 99 mg/dL 86 112(H) 108(H)  BUN 8 - 27 mg/dL 51(H) 37(H) 38(H)  Creatinine 0.76 - 1.27 mg/dL 5.99(H) 5.21(H) 5.12(H)  BUN/Creat Ratio 10 - 24 9(L) - 7(L)  Sodium 134 - 144 mmol/L 137 134(L) 132(L)  Potassium 3.5 - 5.2 mmol/L 4.7 4.3 4.5  Chloride 96 - 106 mmol/L 99 96(L) 92(L)  CO2 20 - 29 mmol/L 25 26 24   Calcium 8.6 - 10.2 mg/dL 8.5(L) 8.8(L) 9.2     Chest imaging: The heart size is enlarged. There are prominent interstitial lung markings. Aortic calcifications are noted. There is no pneumothorax. No large pleural effusion. There is no focal infiltrate. No acute osseous abnormality.  PFT: PFT Results Latest Ref Rng & Units 12/09/2019  FVC-Pre L 2.74  FVC-Predicted Pre % 66  FVC-Post L 2.98  FVC-Predicted Post % 71  Pre FEV1/FVC % % 53  Post FEV1/FCV % % 58  FEV1-Pre L 1.45  FEV1-Predicted Pre % 44  FEV1-Post L 1.72  DLCO uncorrected  ml/min/mmHg 15.26  DLCO UNC% % 53  DLVA Predicted % 70  TLC L 7.06  TLC % Predicted % 98  RV % Predicted % 189    Echo: 02/17/20 1. Left ventricular ejection fraction, by estimation, is 40 to 45%. The  left ventricle has mildly decreased function. The left ventricle  demonstrates global hypokinesis. The left ventricular internal cavity size  was moderately dilated. There is moderate  left ventricular hypertrophy. Left ventricular diastolic parameters are  indeterminate.  2. Right ventricular systolic function is mildly reduced. The right  ventricular size is mildly enlarged. There is severely elevated pulmonary  artery systolic pressure. The estimated right ventricular systolic  pressure is 78.6 mmHg.  3. Left atrial size was severely dilated.  4. Right atrial size was severely dilated.  5. The mitral valve is grossly normal. Moderate to severe mitral valve  regurgitation.  6. Tricuspid valve regurgitation is severe.  7. The aortic valve is tricuspid. Aortic valve regurgitation is trivial.  No aortic stenosis is present.  8. The inferior vena cava is dilated in size with <50% respiratory  variability, suggesting right atrial pressure of 15 mmHg.   Comparison(s): A prior study was performed on 09/12/19. Prior images  reviewed side by side. LVEF has improved slightly.    Assessment & Plan:   Chronic obstructive pulmonary disease, unspecified COPD type ([place])  Dyspnea on exertion - Plan: CT Chest Wo Contrast  Discussion: Omeed Lance is a 64 year old male, former smoker and legally blind with sickle cell anemia, ESRD on HD, atrial fibrillation, pulmonary hypertension and non-ischemic cardiomyopathy who is referred to pulmonary clinic for shortness of breath and abnormal pulmonary function tests.  He has moderately severe obstructive defect with evidence of air trapping and significant response to bronchodilators. He also has moderate decrease in his diffusion  capacity. This pattern of pulmonary function tests is concerning for underlying COPD with emphysema. We will check an overnight oximetry for any nocturnal oxygen saturations to determine if he would benefit from nocturnal oxygen therapy. We will start patient on Anoro Ellipta 1 puff daily and as needed albuterol inahlers.      We will obtain a chest CT to further evaluate his lung parenchyma given the low diffusion defect to evaluate for any contribution of interstitial lung disease based on interstitial prominence on previous chest radiographs.  In regards to his pulmonary hypertension, this is mainly in the setting of group 2 disease (heart failure) with possible contributions from group 3 (COPD) and group 5 (sickle cell anemia). His volume status appears well controlled at this time. He receives dialysis T/Th/Sat. I will review the case with the pulmonary hypertension specialist in our group to determine if pursuing a right heart catheterization would be beneficial in his case.  Follow up in 3 months.   Freda Jackson, MD [facility] Office: [phone]   See Amion for Pager Details       Current Outpatient Medications:  .  acetaminophen (TYLENOL) 500 MG tablet, Take 1,000 mg by mouth every 6 (six) hours as needed for moderate pain or headache., Disp: , Rfl:  .  carvedilol (COREG) 12.5 MG tablet, TAKE 1 TABLET BY MOUTH 2 (TWO) TIMES DAILY WITH A MEAL., Disp: 180 tablet, Rfl: 2 .  diphenhydramine-acetaminophen (TYLENOL PM) 25-500 MG TABS tablet, Take 2 tablets by mouth at bedtime., Disp: , Rfl:  .  ELIQUIS 5 MG TABS tablet, TAKE 1 TABLET BY MOUTH TWICE A DAY, Disp: 60 tablet, Rfl: 11 .  ferric citrate (AURYXIA) 1 GM 210 MG(Fe) tablet, Take 210 mg by mouth See admin instructions. Take 210 mg with each meal and snack, Disp: , Rfl:  .  hydroxyurea (HYDREA) 500 MG capsule, TAKE 1 CAPSULE BY MOUTH EVERY DAY WITH FOOD TO MINIMIZE GI SIDE EFFECTS, Disp: 90 capsule, Rfl: 3 .   isosorbide mononitrate (IMDUR) 30 MG 24 hr tablet, Take 1 tablet (30 mg total) by mouth daily. DO NOT TAKE AM ON DIALYSIS DAYS., Disp: 30 tablet, Rfl: 6 .  lidocaine-prilocaine (EMLA) cream, Apply 1 application topically daily as needed (port access). , Disp: , Rfl:  .  Oxycodone HCl 10 MG TABS, Take 1 tablet (10 mg total) by mouth every 4 (four) hours as needed., Disp: 60 tablet, Rfl: 0 .  albuterol (VENTOLIN HFA) 108 (90 Base) MCG/ACT inhaler, Inhale 2 puffs into the lungs every 6 (six) hours as needed for wheezing or shortness of breath., Disp: 8 g, Rfl: 6 .  allopurinol (ZYLOPRIM) 100 MG tablet, Take 1 tablet (100 mg total) by mouth daily., Disp: 30 tablet, Rfl: 1 .  folic acid (FOLVITE) 1 MG tablet, TAKE 1 TABLET BY MOUTH EVERY DAY, Disp: 90 tablet, Rfl: 3 .  gabapentin (NEURONTIN) 100 MG capsule, Take 1 capsule (100 mg total) by mouth at bedtime., Disp: 30 capsule, Rfl: 3 .  hydrALAZINE (APRESOLINE) 25 MG tablet, Take 1 tablet (25 mg total) by mouth in the morning and at bedtime. DO NOT TAKE AM DOSES ON DIALYSIS DAYS, Disp: 60 tablet, Rfl: 3 .  tiZANidine (ZANAFLEX) 4 MG tablet, Take 0.5 tablets (2 mg total) by mouth every 8 (eight) hours as needed for muscle spasms., Disp: 30 tablet, Rfl: 1

## 2020-05-09 NOTE — Patient Instructions (Signed)
We will check CT Chest scan Start albuterol inhaler as needed for shortness of breath Follow up in 3 months

## 2020-05-09 NOTE — Progress Notes (Signed)
Reviewed PDMP substance reporting system prior to prescribing opiate medications. No inconsistencies noted.  Meds ordered this encounter  Medications   Oxycodone HCl 10 MG TABS    Sig: Take 1 tablet (10 mg total) by mouth every 4 (four) hours as needed.    Dispense:  60 tablet    Refill:  0    Order Specific Question:   Supervising Provider    Answer:   JEGEDE, OLUGBEMIGA E [[id number]]   Alexyss Balzarini Moore Preeti Winegardner  APRN, MSN, FNP-C [facility] [address]  [place], [place] [address] [phone]  

## 2020-05-10 ENCOUNTER — Other Ambulatory Visit: Payer: Self-pay | Admitting: Family Medicine

## 2020-05-10 DIAGNOSIS — M545 Low back pain, unspecified: Secondary | ICD-10-CM

## 2020-05-10 DIAGNOSIS — M79601 Pain in right arm: Secondary | ICD-10-CM

## 2020-05-10 MED ORDER — ALLOPURINOL 100 MG PO TABS: 100.0000 mg | ORAL_TABLET | Freq: Every day | ORAL | 1 refills | Status: DC

## 2020-05-10 MED ORDER — TIZANIDINE HCL 4 MG PO TABS: 2.0000 mg | ORAL_TABLET | Freq: Three times a day (TID) | ORAL | 1 refills | Status: DC | PRN

## 2020-05-10 MED ORDER — GABAPENTIN 100 MG PO CAPS: 100.0000 mg | ORAL_CAPSULE | Freq: Every day | ORAL | 3 refills | Status: DC

## 2020-05-10 NOTE — Progress Notes (Signed)
Meds ordered this encounter  Medications  . gabapentin (NEURONTIN) 100 MG capsule    Sig: Take 1 capsule (100 mg total) by mouth at bedtime.    Dispense:  30 capsule    Refill:  3    Order Specific Question:   Supervising Provider    Answer:   [name] [id number]  . allopurinol (ZYLOPRIM) 100 MG tablet    Sig: Take 1 tablet (100 mg total) by mouth daily.    Dispense:  30 tablet    Refill:  1    Order Specific Question:   Supervising Provider    Answer:   [name] [id number]  . tiZANidine (ZANAFLEX) 4 MG tablet    Sig: Take 0.5 tablets (2 mg total) by mouth every 8 (eight) hours as needed for muscle spasms.    Dispense:  30 tablet    Refill:  1    Order Specific Question:   Supervising Provider    Answer:   [name] [[id number]]    [name]  APRN, MSN, FNP-C [facility] [address] [place], [place] [address] [phone]

## 2020-05-11 ENCOUNTER — Other Ambulatory Visit: Payer: Self-pay | Admitting: Family Medicine

## 2020-05-11 DIAGNOSIS — D571 Sickle-cell disease without crisis: Secondary | ICD-10-CM

## 2020-05-11 NOTE — Telephone Encounter (Signed)
Please see patient request for refill. Sending to [employer] in [place]'s absence.

## 2020-05-11 NOTE — Telephone Encounter (Signed)
Done

## 2020-05-13 ENCOUNTER — Telehealth: Payer: Self-pay | Admitting: Pulmonary Disease

## 2020-05-13 MED ORDER — ANORO ELLIPTA 62.5-25 MCG/INH IN AEPB: 1.0000 | INHALATION_SPRAY | Freq: Every day | RESPIRATORY_TRACT | 6 refills | Status: DC

## 2020-05-13 NOTE — Telephone Encounter (Signed)
Please let the patient know I have sent in a prescription for Anoro Ellipta inhaler to be taken 1 puff daily. He should have the pharmacist give him a detailed overview on how to use the inhaler when he picks it up. He can use the albuterol every 4-6 hours 1-2 puffs as needed on top of the Anoro. I have also ordered an overnight oximetry test to be done on room air to determine if he would benefit from oxygen therapy at night.   Thanks, Freda Jackson, MD [facility] Office: [phone]   See Amion for Pager Details

## 2020-05-13 NOTE — Telephone Encounter (Signed)
ATC patient unable to reach LM to call back office (x1)  

## 2020-05-16 ENCOUNTER — Ambulatory Visit (INDEPENDENT_AMBULATORY_CARE_PROVIDER_SITE_OTHER)
Admission: RE | Admit: 2020-05-16 | Discharge: 2020-05-16 | Disposition: A | Discharge status: Home / Self Care | Payer: HMO | Source: Ambulatory Visit | Attending: Pulmonary Disease | Admitting: Pulmonary Disease

## 2020-05-16 ENCOUNTER — Other Ambulatory Visit: Payer: Self-pay

## 2020-05-16 DIAGNOSIS — R0609 Other forms of dyspnea: Secondary | ICD-10-CM

## 2020-05-16 DIAGNOSIS — R0600 Dyspnea, unspecified: Secondary | ICD-10-CM

## 2020-05-17 ENCOUNTER — Other Ambulatory Visit: Payer: Self-pay

## 2020-05-17 DIAGNOSIS — N186 End stage renal disease: Secondary | ICD-10-CM | POA: Diagnosis not present

## 2020-05-17 DIAGNOSIS — Z992 Dependence on renal dialysis: Secondary | ICD-10-CM | POA: Diagnosis not present

## 2020-05-17 DIAGNOSIS — I158 Other secondary hypertension: Secondary | ICD-10-CM | POA: Diagnosis not present

## 2020-05-17 NOTE — Patient Outreach (Signed)
  [facility]) Care Management Chronic Special Needs Program    05/17/2020  Name: Patrick [name], DOB: Dec 21, 1955  MRN: [id number]   [name] is enrolled in a chronic special needs plan for Heart Failure. [facility] Management will continue to provide services for this member through 06/17/20.  The [employer] care management team will assume care 06/18/2020.   Quinn Plowman RN,BSN,CCM [facility] Management [phone]

## 2020-05-17 NOTE — Telephone Encounter (Signed)
ATCx3, unable to leave VM d/t vm full.

## 2020-05-17 NOTE — Telephone Encounter (Signed)
lmtcb for pt.  

## 2020-05-19 ENCOUNTER — Other Ambulatory Visit: Payer: Self-pay

## 2020-05-19 DIAGNOSIS — Z992 Dependence on renal dialysis: Secondary | ICD-10-CM | POA: Diagnosis not present

## 2020-05-19 DIAGNOSIS — D509 Iron deficiency anemia, unspecified: Secondary | ICD-10-CM | POA: Diagnosis not present

## 2020-05-19 DIAGNOSIS — N186 End stage renal disease: Secondary | ICD-10-CM | POA: Diagnosis not present

## 2020-05-19 DIAGNOSIS — N2581 Secondary hyperparathyroidism of renal origin: Secondary | ICD-10-CM | POA: Diagnosis not present

## 2020-05-19 DIAGNOSIS — E876 Hypokalemia: Secondary | ICD-10-CM | POA: Diagnosis not present

## 2020-05-19 DIAGNOSIS — D631 Anemia in chronic kidney disease: Secondary | ICD-10-CM | POA: Diagnosis not present

## 2020-05-19 DIAGNOSIS — D689 Coagulation defect, unspecified: Secondary | ICD-10-CM | POA: Diagnosis not present

## 2020-05-19 NOTE — Patient Outreach (Signed)
  [facility]) Care Management Chronic Special Needs Program    05/19/2020  Name: Patrick [name], DOB: [birth year]  MRN: [id number]  [employer] [facility] [employer] care management team has assumed care and services for this member. Case Closed by [facility] care management.   Quinn Plowman RN,BSN,CCM [facility] [phone]

## 2020-05-23 ENCOUNTER — Ambulatory Visit (HOSPITAL_COMMUNITY)
Admission: RE | Admit: 2020-05-23 | Discharge: 2020-05-23 | Disposition: A | Discharge status: Home / Self Care | Payer: HMO | Source: Ambulatory Visit | Attending: Internal Medicine | Admitting: Internal Medicine

## 2020-05-23 ENCOUNTER — Other Ambulatory Visit: Payer: Self-pay

## 2020-05-23 ENCOUNTER — Other Ambulatory Visit (HOSPITAL_COMMUNITY): Payer: Self-pay

## 2020-05-23 ENCOUNTER — Encounter (HOSPITAL_COMMUNITY): Payer: Self-pay | Admitting: Internal Medicine

## 2020-05-23 VITALS — BP 110/64 | HR 88 | Wt 160.6 lb

## 2020-05-23 DIAGNOSIS — I5042 Chronic combined systolic (congestive) and diastolic (congestive) heart failure: Secondary | ICD-10-CM | POA: Insufficient documentation

## 2020-05-23 DIAGNOSIS — Z79899 Other long term (current) drug therapy: Secondary | ICD-10-CM | POA: Diagnosis not present

## 2020-05-23 DIAGNOSIS — I132 Hypertensive heart and chronic kidney disease with heart failure and with stage 5 chronic kidney disease, or end stage renal disease: Secondary | ICD-10-CM | POA: Diagnosis not present

## 2020-05-23 DIAGNOSIS — D571 Sickle-cell disease without crisis: Secondary | ICD-10-CM | POA: Insufficient documentation

## 2020-05-23 DIAGNOSIS — I428 Other cardiomyopathies: Secondary | ICD-10-CM | POA: Insufficient documentation

## 2020-05-23 DIAGNOSIS — Z7901 Long term (current) use of anticoagulants: Secondary | ICD-10-CM | POA: Diagnosis not present

## 2020-05-23 DIAGNOSIS — I4819 Other persistent atrial fibrillation: Secondary | ICD-10-CM | POA: Insufficient documentation

## 2020-05-23 DIAGNOSIS — I5022 Chronic systolic (congestive) heart failure: Secondary | ICD-10-CM | POA: Diagnosis not present

## 2020-05-23 DIAGNOSIS — Z992 Dependence on renal dialysis: Secondary | ICD-10-CM | POA: Diagnosis not present

## 2020-05-23 DIAGNOSIS — N186 End stage renal disease: Secondary | ICD-10-CM | POA: Diagnosis not present

## 2020-05-23 DIAGNOSIS — I272 Pulmonary hypertension, unspecified: Secondary | ICD-10-CM | POA: Insufficient documentation

## 2020-05-23 DIAGNOSIS — Z8249 Family history of ischemic heart disease and other diseases of the circulatory system: Secondary | ICD-10-CM | POA: Insufficient documentation

## 2020-05-23 DIAGNOSIS — Z87891 Personal history of nicotine dependence: Secondary | ICD-10-CM | POA: Insufficient documentation

## 2020-05-23 MED ORDER — METOLAZONE 5 MG PO TABS
5.0000 mg | ORAL_TABLET | ORAL | 0 refills | Status: DC
Start: 2020-05-23 — End: 2020-05-23

## 2020-05-23 MED ORDER — METOLAZONE 5 MG PO TABS: 5.0000 mg | ORAL_TABLET | Freq: Every day | ORAL | 0 refills | Status: DC | PRN

## 2020-05-23 NOTE — Progress Notes (Signed)
ReDS Vest / Clip - 05/23/20 1000      ReDS Vest / Clip   Station Marker D    Ruler Value 28.5    ReDS Value Range High volume overload    ReDS Actual Value 49    Anatomical Comments sitting

## 2020-05-23 NOTE — Patient Instructions (Signed)
Take Metolazone 5 mg Today Wednesday and Friday only  Your physician recommends that you schedule a follow-up appointment in: 4 months.Please call in March 2022  If you have any questions or concerns before your next appointment please send [place] a message through [place] or call our office at [phone].    TO LEAVE A MESSAGE FOR THE NURSE SELECT OPTION 2, PLEASE LEAVE A MESSAGE INCLUDING: . YOUR NAME . DATE OF BIRTH . CALL BACK NUMBER . REASON FOR CALL**this is important as we prioritize the call backs  [place] AS LONG AS YOU CALL BEFORE 4:00 PM   At the [facility], you and your health needs are our priority. As part of our continuing mission to provide you with exceptional heart care, we have created designated Provider Care Teams. These Care Teams include your primary Cardiologist (physician) and Advanced Practice Providers (APPs- Physician Assistants and Nurse Practitioners) who all work together to provide you with the care you need, when you need it.   You may see any of the following providers on your designated Care Team at your next follow up: Marland Kitchen Dr Glori Bickers . Dr Loralie Champagne . [name], NP . Lyda Jester, PA . Audry Riles, PharmD   Please be sure to bring in all your medications bottles to every appointment.

## 2020-05-23 NOTE — Progress Notes (Signed)
Patient ID: Patrick [name], male   DOB: July 16, 1955, 64 y.o.   MRN: [id number]     Advanced Heart Failure Clinic Note   PCP: [name] : Dr Zigmund Cooper Moroney  Opthalmologist: [name] HF MD: Dr Haroldine Laws Nephrologist: Dr Moshe Cipro.   HPI: Patrick [name] is a 64 y.o. male  with a history of sickle cell anemia, systolic HF due to NICM EF 40% 11/2012 initially diagnosed 05/2011, no CAD by cath 05/2011, CKD stage III, legally blind, and tobacco abuse.   Admitted 7/6 through 5/97/4163 with A/C systolic heart failure and CKD in setting of new A fib. Started coumadin. Discharge weight was 135 pounds.   Admitted 4/3 - 09/24/17 with upper GI bleed and profound anemia with Hgb of 4.6 with Sickle Cell. Transfused with RBCs. EGD showed nonbleeding gastritis, biopsies were obtained. Pathology was pending at discharge. Colonoscopy showed small internal hemorrhoids, otherwise normal. .  Admitted 84/5364 with A/C systolic HF and sickle cell anemia. Diuresed with IV lasix and transitioned to torsemide 100 mg daily. Found to be back in Afib. Eliquis was continued. He was started on amio drip and underwent DCCV 12/19. Amio transitioned to PO. He required 1 uPRBCs for anemia. He also had chest pain with negative troponin and no EKG changes. Myoview showed no significant ischemia, EF 46%   Started HD in 05/2019   Admitted 6/80 for systolic HF w/ fluid overload. He had been compliant w/ HD but acute exacerbation related to dietary indiscretion w/ sodium. He was admitted and underwent an extra HD session and nephrology reduced his EDW goal. Volume improved w/ HD. EKG showed rate controlled afib. 2D echo showed LVEF 35-40%, low normal RV function,severe pulm HTN in setting of volume overload with RVSP 53mmHG. Severe LAE, mod RAE, mod MR, severe TR. RV TAPSE 1.6, RV s' 10.  On  10/06/19 he had DC-CV with restoration of NSR. Found to have recurrent AFL in 5/21. Referred to AF Clinic. Felt that patient was not  good candidate to restart amiodarone. Rate control strategy continued.   Here for HF follow up, now doing HD M/T/TH/Sat, having SOB and swelling for past couple of months. Been drinking a lot of fluid trying to prevent sickle cell crisis, as he feels he is about to get into crisis. Drinking >2L fluid/daily. Wants to increase fluid removal to 4.5L but able to take of 3.4L on Saturday.  Urinates about 3x/day with taking torsemide 20mg  4x/day. SOB with minimal activity,  +benopnea & PND (sleeps with 12 pillows), no orthopnea. Unable to work since July (previously worked for [employer]). Does not check BP at home. Uses SCAT. Stopped smoking several years ago. No bleeding issues. ReD's Clip 49% today.  01/17/12 ECHO 55%-60%  11/20/12 ECHO EF 40% Peak PA pressure 56 11/30/13 ECHO 40-45%  12/2015: ECHO 40%.  07/2017: EF 40-45% 05/2018: EF 25-30% 08/2019: LVEF 35-40%, low normal RV function, severe pulm HTN.  SH: Lives alone. Uses SCAT. Previously smoked 8 cigarettes per day but now quit. Occasional ETOH and no drugs  FH: Mother deceased: liver failure, no CAD        Father deceased: CAD, MI  Review of systems complete and found to be negative unless listed in HPI.   Past Medical History:  Diagnosis Date  . Blood dyscrasia   . Blood transfusion    "I've had a bunch of them"  . CHF (congestive heart failure) ([place])    followed by hf clinic  .  Chronic kidney disease (CKD), stage III (moderate) (HCC)   . Dialysis patient ([place]) 04/2019   AV fistula (right arm)  . Gout   . DXAJOINO(676.7)    "probably weekly" (01/13/2014)  . Legally blind   . Shortness of breath dyspnea   . Sickle cell disease, type SS ([place])   . Swelling abdomen   . Swelling of extremity, left   . Swelling of extremity, right     Current Outpatient Medications  Medication Sig Dispense Refill  . acetaminophen (TYLENOL) 500 MG tablet Take 1,000 mg by mouth every 6 (six) hours as needed for moderate pain or headache.    .  albuterol (VENTOLIN HFA) 108 (90 Base) MCG/ACT inhaler Inhale 2 puffs into the lungs every 6 (six) hours as needed for wheezing or shortness of breath. 8 g 6  . allopurinol (ZYLOPRIM) 100 MG tablet Take 1 tablet (100 mg total) by mouth daily. 30 tablet 1  . carvedilol (COREG) 12.5 MG tablet TAKE 1 TABLET BY MOUTH 2 (TWO) TIMES DAILY WITH A MEAL. 180 tablet 2  . diphenhydramine-acetaminophen (TYLENOL PM) 25-500 MG TABS tablet Take 2 tablets by mouth at bedtime.    Marland Kitchen ELIQUIS 5 MG TABS tablet TAKE 1 TABLET BY MOUTH TWICE A DAY 60 tablet 11  . ferric citrate (AURYXIA) 1 GM 210 MG(Fe) tablet Take 210 mg by mouth See admin instructions. Take 210 mg with each meal and snack    . folic acid (FOLVITE) 1 MG tablet TAKE 1 TABLET BY MOUTH EVERY DAY 90 tablet 3  . gabapentin (NEURONTIN) 100 MG capsule Take 1 capsule (100 mg total) by mouth at bedtime. 30 capsule 3  . hydrALAZINE (APRESOLINE) 25 MG tablet Take 1 tablet (25 mg total) by mouth in the morning and at bedtime. DO NOT TAKE AM DOSES ON DIALYSIS DAYS 60 tablet 3  . hydroxyurea (HYDREA) 500 MG capsule TAKE 1 CAPSULE BY MOUTH EVERY DAY WITH FOOD TO MINIMIZE GI SIDE EFFECTS 90 capsule 3  . isosorbide mononitrate (IMDUR) 30 MG 24 hr tablet Take 1 tablet (30 mg total) by mouth daily. DO NOT TAKE AM ON DIALYSIS DAYS. 30 tablet 6  . lidocaine-prilocaine (EMLA) cream Apply 1 application topically daily as needed (port access).     . Oxycodone HCl 10 MG TABS Take 1 tablet (10 mg total) by mouth every 4 (four) hours as needed. 60 tablet 0  . tiZANidine (ZANAFLEX) 4 MG tablet Take 0.5 tablets (2 mg total) by mouth every 8 (eight) hours as needed for muscle spasms. 30 tablet 1  . torsemide (DEMADEX) 20 MG tablet Take 80 mg by mouth daily.    Marland Kitchen umeclidinium-vilanterol (ANORO ELLIPTA) 62.5-25 MCG/INH AEPB Inhale 1 puff into the lungs daily. 1 each 6   No current facility-administered medications for this encounter.   Vitals:   05/23/20 0955  BP: 110/64  Pulse: 88   SpO2: 99%  Weight: 72.8 kg (160 lb 9.6 oz)     Wt Readings from Last 3 Encounters:  05/23/20 72.8 kg (160 lb 9.6 oz)  05/09/20 69.6 kg (153 lb 6.4 oz)  01/27/20 70.5 kg (155 lb 6.4 oz)   PHYSICAL EXAM: General:  Sitting on exam table. No resp difficulty HEENT: normal Neck: supple. JVP to jaw with prominent v waves . Carotids 2+ bilat; no bruits. No lymphadenopathy or thryomegaly appreciated. Cor: PMI nondisplaced. irregular rate & rhythm. No rubs, gallops or 2/6 systolic murmur at left midclavicular border Lungs: clear, diminished Abdomen: distended, taut. No hepatosplenomegaly.  No bruits or masses. Good bowel sounds. Extremities: no cyanosis, clubbing, rash, 1-2+ lower extremity edema; right upper arm fistula w/ bruit/thrill Neuro: alert & orientedx3, cranial nerves grossly intact. moves all 4 extremities w/o difficulty. Affect pleasant  ASSESSMENT & PLAN:  1. Chronic Combined Systolic/Diastolic Heart Failure:  - NICM. Cath 05/2011 without significant coronary disease.   - ECHO 12/2015 40%.  - Echo 05/2018: EF 25-30%.  - Echo 3/21 LVEF 35-40%, low normal RV function,severe pulm HTN. Severe LAE, mod RAE, mod MR, severe TR. RV TAPSE 1.6, RV s' 10 - Echo 9/21 EF 40-45%, RV mildly reduced, mild RAE, severe pulm artery systolic pressure, severe TR - NYHA III. Volume status up on exam & ReDs Clip 49% in setting of high fluid intake. Counseled extensively on need to cut back fluid intake.  - start Metolazone 5 mg daily on MWF this week only; no K supp. I d/w Dr. Moshe Cipro personally who agrees.  - Continue torsemide 80 mg daily - Continue Coreg 12.5 BID. - Continue Hydral/Imdur as BP tolerates. For compliance reasons use hydralazine 25 bid and Imdur 30 daily (hold both on am of HD)  - Off Entresto with ESRD - labs with HD  2. Pulmonary HTN  - last echo done in setting of volume overload so may be pulmonary venous HTN but I am concerned for high out PAH in setting of large AVF. May  also have component due to SSCD - Repeat echo - Can consider RHC as needed but doubt this will change management much at this point. Need to get volume under control first.  3. HTN: - BP controlled   4  Sickle Cell:   - Follows closely with Sickle Cell Clinic.  - no change - encouraged <2L daily fluid intake (has not had crisis in awhile and better to be even than overloaded)  5. Former smoker:   - Stopped 11/2015. - Discussed smoking cessation.   6. ESRD - On HD now M/T/TH/Sat - limit fluids  7. Persistent Atrial Fibrillation/AFL - S/p DCCV 05/2018 - S/P DCCV 10/06/19 NSR - This patients CHA2DS2-VASc is at least 3.  - has been seen in AF Clinic and rate control strategy recommended - Zio 9/21-->1.  100% burden Atrial Fibrillation ranging from 43-181 bpm (avg of 84 bpm), 4 episodes of    NSVT, rare PVCs - Continue carvedilol and Eliquis  8. H/o GI bleeding: - Prior EGD in 2019 showed nonbleeding gastritis, Colonoscopy showed small internal hemorrhoids, otherwise normal.  - No recent bleeding issues.  - Continue Eliquis    Glori Bickers, MD  05/23/2020

## 2020-05-24 ENCOUNTER — Ambulatory Visit (INDEPENDENT_AMBULATORY_CARE_PROVIDER_SITE_OTHER): Payer: HMO | Admitting: Family Medicine

## 2020-05-24 VITALS — BP 126/80 | HR 70 | Temp 97.7°F | Resp 18 | Ht 71.0 in | Wt 151.0 lb

## 2020-05-24 DIAGNOSIS — E559 Vitamin D deficiency, unspecified: Secondary | ICD-10-CM | POA: Diagnosis not present

## 2020-05-24 DIAGNOSIS — G894 Chronic pain syndrome: Secondary | ICD-10-CM

## 2020-05-24 DIAGNOSIS — D571 Sickle-cell disease without crisis: Secondary | ICD-10-CM | POA: Diagnosis not present

## 2020-05-24 DIAGNOSIS — N186 End stage renal disease: Secondary | ICD-10-CM

## 2020-05-24 DIAGNOSIS — F17200 Nicotine dependence, unspecified, uncomplicated: Secondary | ICD-10-CM | POA: Diagnosis not present

## 2020-05-24 NOTE — Progress Notes (Signed)
[facility]   Established Patient Office Visit  Subjective:  Patient ID: Patrick [name], male    DOB: [birth year]  Age: 64 y.o. MRN: [id number]  CC:  Chief Complaint  Patient presents with  . Follow-up    HPI Kiven D Mrozek is a very pleasant 64 year old male with a medical history significant for sickle cell disease, end-stage renal disease on dialysis, chronic heart failure with preserved EF, history of anemia of chronic disease, and tobacco dependence who is legally blind presents for medication management and follow-up of chronic conditions.  Patient says that he is doing fairly well.  His only complaint is increased swelling to lower extremities.  Patient states that swelling has been present over the past 2 weeks.  He was evaluated by cardiologist several days ago who added Demadex to medication regimen.  He states that swelling has improved since starting medication.  Also, prior to arrival patient completed hemodialysis.  He states that he feels somewhat fatigued, which is typical after hemodialysis.  Patient has a history of chronic pain.  Pain is primarily to low back.  He last had oxycodone 2 days prior, he is not having pain at this time.  He denies any headache, chest pain, dizziness, nausea, vomiting, or diarrhea.  Past Medical History:  Diagnosis Date  . Blood dyscrasia   . Blood transfusion    "I've had a bunch of them"  . CHF (congestive heart failure) ([place])    followed by hf clinic  . Chronic kidney disease (CKD), stage III (moderate) (HCC)   . Dialysis patient ([place]) 04/2019   AV fistula (right arm)  . Gout   . HUTMLYYT(035.4)    "probably weekly" (01/13/2014)  . Legally blind   . Shortness of breath dyspnea   . Sickle cell disease, type SS ([place])   . Swelling abdomen   . Swelling of extremity, left   . Swelling of extremity, right     Past Surgical History:  Procedure Laterality Date  . BASCILIC VEIN  TRANSPOSITION Right 04/12/2014   Procedure: BASILIC VEIN TRANSPOSITION;  Surgeon: Elam Dutch, MD;  Location: [place];  Service: Vascular;  Laterality: Right;  . CARDIAC CATHETERIZATION  05/2011  . CARDIOVERSION N/A 06/05/2018   Procedure: CARDIOVERSION;  Surgeon: Jolaine Artist, MD;  Location: [facility] ENDOSCOPY;  Service: Cardiovascular;  Laterality: N/A;  . CARDIOVERSION N/A 10/06/2019   Procedure: CARDIOVERSION;  Surgeon: Jolaine Artist, MD;  Location: [place];  Service: Cardiovascular;  Laterality: N/A;  . COLONOSCOPY WITH PROPOFOL N/A 09/24/2017   Procedure: COLONOSCOPY WITH PROPOFOL;  Surgeon: Jackquline Denmark, MD;  Location: [facility] ENDOSCOPY;  Service: Endoscopy;  Laterality: N/A;  . ESOPHAGOGASTRODUODENOSCOPY N/A 09/19/2017   Procedure: ESOPHAGOGASTRODUODENOSCOPY (EGD);  Surgeon: Jackquline Denmark, MD;  Location: [facility] ENDOSCOPY;  Service: Endoscopy;  Laterality: N/A;  . LEFT AND RIGHT HEART CATHETERIZATION WITH CORONARY ANGIOGRAM N/A 06/11/2011   Procedure: LEFT AND RIGHT HEART CATHETERIZATION WITH CORONARY ANGIOGRAM;  Surgeon: Larey Dresser, MD;  Location: [facility] CATH LAB;  Service: Cardiovascular;  Laterality: N/A;  . TEE WITHOUT CARDIOVERSION N/A 12/27/2015   Procedure: TRANSESOPHAGEAL ECHOCARDIOGRAM (TEE);  Surgeon: Sueanne Margarita, MD;  Location: [facility] ENDOSCOPY;  Service: Cardiovascular;  Laterality: N/A;    Family History  Problem Relation Age of Onset  . Alcohol abuse Mother   . Liver disease Mother   . Cirrhosis Mother   . Heart attack Mother     Social History   Socioeconomic History  .  Marital status: Divorced    Spouse name: Not on file  . Number of children: Not on file  . Years of education: Not on file  . Highest education level: Not on file  Occupational History  . Not on file  Tobacco Use  . Smoking status: Former Smoker    Packs/day: 0.25    Years: 41.00    Pack years: 10.25    Types: Cigarettes    Quit date: 12/08/2015    Years since quitting: 4.4  . Smokeless  tobacco: Never Used  Vaping Use  . Vaping Use: Never used  Substance and Sexual Activity  . Alcohol use: Yes    Alcohol/week: 3.0 standard drinks    Types: 1 Glasses of wine, 2 Cans of beer per week    Comment: occasional  . Drug use: No  . Sexual activity: Not Currently  Other Topics Concern  . Not on file  Social History Narrative  . Not on file   Social Determinants of Health   Financial Resource Strain:   . Difficulty of Paying Living Expenses: Not on file  Food Insecurity:   . Worried About Charity fundraiser in the Last Year: Not on file  . Ran Out of Food in the Last Year: Not on file  Transportation Needs: No Transportation Needs  . Lack of Transportation (Medical): No  . Lack of Transportation (Non-Medical): No  Physical Activity: Sufficiently Active  . Days of Exercise per Week: 5 days  . Minutes of Exercise per Session: 90 min  Stress:   . Feeling of Stress : Not on file  Social Connections:   . Frequency of Communication with Friends and Family: Not on file  . Frequency of Social Gatherings with Friends and Family: Not on file  . Attends Religious Services: Not on file  . Active Member of Clubs or Organizations: Not on file  . Attends Archivist Meetings: Not on file  . Marital Status: Not on file  Intimate Partner Violence:   . Fear of Current or Ex-Partner: Not on file  . Emotionally Abused: Not on file  . Physically Abused: Not on file  . Sexually Abused: Not on file    Outpatient Medications Prior to Visit  Medication Sig Dispense Refill  . acetaminophen (TYLENOL) 500 MG tablet Take 1,000 mg by mouth every 6 (six) hours as needed for moderate pain or headache.    . albuterol (VENTOLIN HFA) 108 (90 Base) MCG/ACT inhaler Inhale 2 puffs into the lungs every 6 (six) hours as needed for wheezing or shortness of breath. 8 g 6  . allopurinol (ZYLOPRIM) 100 MG tablet Take 1 tablet (100 mg total) by mouth daily. 30 tablet 1  . carvedilol (COREG) 12.5  MG tablet TAKE 1 TABLET BY MOUTH 2 (TWO) TIMES DAILY WITH A MEAL. 180 tablet 2  . diphenhydramine-acetaminophen (TYLENOL PM) 25-500 MG TABS tablet Take 2 tablets by mouth at bedtime.    Marland Kitchen ELIQUIS 5 MG TABS tablet TAKE 1 TABLET BY MOUTH TWICE A DAY 60 tablet 11  . ferric citrate (AURYXIA) 1 GM 210 MG(Fe) tablet Take 210 mg by mouth See admin instructions. Take 210 mg with each meal and snack    . folic acid (FOLVITE) 1 MG tablet TAKE 1 TABLET BY MOUTH EVERY DAY 90 tablet 3  . gabapentin (NEURONTIN) 100 MG capsule Take 1 capsule (100 mg total) by mouth at bedtime. 30 capsule 3  . hydroxyurea (HYDREA) 500 MG capsule TAKE 1 CAPSULE  BY MOUTH EVERY DAY WITH FOOD TO MINIMIZE GI SIDE EFFECTS 90 capsule 3  . isosorbide mononitrate (IMDUR) 30 MG 24 hr tablet Take 1 tablet (30 mg total) by mouth daily. DO NOT TAKE AM ON DIALYSIS DAYS. 30 tablet 6  . lidocaine-prilocaine (EMLA) cream Apply 1 application topically daily as needed (port access).     . metolazone (ZAROXOLYN) 5 MG tablet Take 1 tablet (5 mg total) by mouth daily as needed. 10 tablet 0  . Oxycodone HCl 10 MG TABS Take 1 tablet (10 mg total) by mouth every 4 (four) hours as needed. 60 tablet 0  . tiZANidine (ZANAFLEX) 4 MG tablet Take 0.5 tablets (2 mg total) by mouth every 8 (eight) hours as needed for muscle spasms. 30 tablet 1  . torsemide (DEMADEX) 20 MG tablet Take 80 mg by mouth daily.    Marland Kitchen umeclidinium-vilanterol (ANORO ELLIPTA) 62.5-25 MCG/INH AEPB Inhale 1 puff into the lungs daily. 1 each 6  . hydrALAZINE (APRESOLINE) 25 MG tablet Take 1 tablet (25 mg total) by mouth in the morning and at bedtime. DO NOT TAKE AM DOSES ON DIALYSIS DAYS 60 tablet 3   No facility-administered medications prior to visit.    No Known Allergies  ROS Review of Systems  Constitutional: Positive for fatigue.  HENT: Negative.   Respiratory: Negative.   Cardiovascular: Negative.   Gastrointestinal: Negative.   Endocrine: Negative for polydipsia, polyphagia  and polyuria.  Genitourinary: Negative.   Musculoskeletal: Positive for arthralgias.  Skin: Negative.   Neurological: Negative.   Hematological: Negative.   Psychiatric/Behavioral: Negative.       Objective:    Physical Exam  BP 126/80 (BP Location: Left Arm, Patient Position: Sitting, Cuff Size: Normal)   Pulse 70   Temp 97.7 F (36.5 C) (Temporal)   Resp 18   Ht 5\' 11"  (1.803 m)   Wt 151 lb (68.5 kg)   SpO2 100%   BMI 21.06 kg/m  Wt Readings from Last 3 Encounters:  05/24/20 151 lb (68.5 kg)  05/23/20 160 lb 9.6 oz (72.8 kg)  05/09/20 153 lb 6.4 oz (69.6 kg)     Health Maintenance Due  Topic Date Due  . URINE MICROALBUMIN  Never done    There are no preventive care reminders to display for this patient.  Lab Results  Component Value Date   TSH 2.301 02/17/2015   Lab Results  Component Value Date   WBC 4.4 12/29/2019   HGB 8.9 (L) 12/29/2019   HCT 26.1 (L) 12/29/2019   MCV 124 (H) 12/29/2019   PLT 144 (L) 12/29/2019   Lab Results  Component Value Date   NA 137 12/29/2019   K 4.7 12/29/2019   CO2 25 12/29/2019   GLUCOSE 86 12/29/2019   BUN 51 (H) 12/29/2019   CREATININE 5.99 (H) 12/29/2019   BILITOT 0.9 12/29/2019   ALKPHOS 146 (H) 12/29/2019   AST 34 12/29/2019   ALT 22 12/29/2019   PROT 7.0 12/29/2019   ALBUMIN 4.0 12/29/2019   CALCIUM 8.5 (L) 12/29/2019   ANIONGAP 12 10/01/2019   GFR 46.63 (L) 01/08/2012   Lab Results  Component Value Date   CHOL 114 (L) 02/17/2015   Lab Results  Component Value Date   HDL 31 (L) 02/17/2015   Lab Results  Component Value Date   LDLCALC 68 02/17/2015   Lab Results  Component Value Date   TRIG 76 02/17/2015   Lab Results  Component Value Date   CHOLHDL 3.7 02/17/2015  Lab Results  Component Value Date   HGBA1C <4.0 06/07/2011      Assessment & Plan:   Problem List Items Addressed This Visit      Genitourinary   End stage renal disease ([place])     Other   Vitamin D deficiency    Relevant Orders   Sickle Cell Panel   Hb-SS disease without crisis ([place]) - Primary   Relevant Orders   Sickle Cell Panel    Other Visit Diagnoses    Chronic pain syndrome         1. Hb-SS disease without crisis [facility]) Patient has well-controlled sickle cell disease with infrequent pain crises.  He has some degree of chronic pain that is controlled by oxycodone.  Patient continues hydroxyurea and folic acid daily.  He says that previous hemoglobin was decreased per nephrology.  Will repeat CBC on today and follow-up by phone with lab results. Patient is up-to-date with ophthalmologist. He is not up-to-date with all vaccinations, advised to get Covid booster vaccination, he expressed understanding. Will recheck vitamin D levels, has been markedly low in the past.  He is currently not on vitamin D supplement.  - Sickle Cell Panel  2. End stage renal disease [facility]) Patient completed hemodialysis prior to arrival.  Continue per nephrology.  3. Chronic pain syndrome Continue oxycodone 10 mg every 6 hours as needed for severe pain.  No medication changes warranted at this time being that patient's pain is controlled.  4. Vitamin D deficiency - Sickle Cell Panel  5. Tobacco dependence Smoking cessation instruction/counseling given:  counseled patient on the dangers of tobacco use, advised patient to stop smoking, and reviewed strategies to maximize success  Follow-up: 3 months for chronic conditions   Zayne Draheim Al Decant  APRN, MSN, FNP-C Patient [facility] [address] [place],  [address] [phone]

## 2020-05-25 ENCOUNTER — Other Ambulatory Visit: Payer: Self-pay | Admitting: Family Medicine

## 2020-05-25 DIAGNOSIS — E559 Vitamin D deficiency, unspecified: Secondary | ICD-10-CM

## 2020-05-25 LAB — CMP14+CBC/D/PLT+FER+RETIC+V...
ALT: 16 IU/L (ref 0–44)
AST: 21 IU/L (ref 0–40)
Albumin/Globulin Ratio: 1.2 (ref 1.2–2.2)
Albumin: 4.1 g/dL (ref 3.8–4.8)
Alkaline Phosphatase: 156 IU/L — ABNORMAL HIGH (ref 44–121)
BUN/Creatinine Ratio: 8 — ABNORMAL LOW (ref 10–24)
BUN: 25 mg/dL (ref 8–27)
Basophils Absolute: 0.1 10*3/uL (ref 0.0–0.2)
Basos: 1 %
Bilirubin Total: 2.4 mg/dL — ABNORMAL HIGH (ref 0.0–1.2)
CO2: 29 mmol/L (ref 20–29)
Calcium: 8.5 mg/dL — ABNORMAL LOW (ref 8.6–10.2)
Chloride: 92 mmol/L — ABNORMAL LOW (ref 96–106)
Creatinine, Ser: 3.02 mg/dL — ABNORMAL HIGH (ref 0.76–1.27)
EOS (ABSOLUTE): 0.1 10*3/uL (ref 0.0–0.4)
Eos: 2 %
Ferritin: 236 ng/mL (ref 30–400)
GFR calc Af Amer: 24 mL/min/{1.73_m2} — ABNORMAL LOW (ref >59)
GFR calc non Af Amer: 21 mL/min/{1.73_m2} — ABNORMAL LOW (ref >59)
Globulin, Total: 3.4 g/dL (ref 1.5–4.5)
Glucose: 77 mg/dL (ref 65–99)
Hematocrit: 25.6 % — ABNORMAL LOW (ref 37.5–51.0)
Hemoglobin: 9.1 g/dL — ABNORMAL LOW (ref 13.0–17.7)
Immature Grans (Abs): 0.1 10*3/uL (ref 0.0–0.1)
Immature Granulocytes: 1 %
Lymphocytes Absolute: 0.9 10*3/uL (ref 0.7–3.1)
Lymphs: 21 %
MCH: 44 pg — ABNORMAL HIGH (ref 26.6–33.0)
MCHC: 35.5 g/dL (ref 31.5–35.7)
MCV: 124 fL — ABNORMAL HIGH (ref 79–97)
Monocytes Absolute: 0.6 10*3/uL (ref 0.1–0.9)
Monocytes: 15 %
NRBC: 2 % — ABNORMAL HIGH (ref 0–0)
Neutrophils Absolute: 2.5 10*3/uL (ref 1.4–7.0)
Neutrophils: 60 %
Platelets: 88 10*3/uL — CL (ref 150–450)
Potassium: 3.3 mmol/L — ABNORMAL LOW (ref 3.5–5.2)
RBC: 2.07 x10E6/uL — CL (ref 4.14–5.80)
RDW: 14.6 % (ref 11.6–15.4)
Retic Ct Pct: 4.2 % — ABNORMAL HIGH (ref 0.6–2.6)
Sodium: 135 mmol/L (ref 134–144)
Total Protein: 7.5 g/dL (ref 6.0–8.5)
Vit D, 25-Hydroxy: 13.7 ng/mL — ABNORMAL LOW (ref 30.0–100.0)
WBC: 4.2 10*3/uL (ref 3.4–10.8)

## 2020-05-25 MED ORDER — ERGOCALCIFEROL 1.25 MG (50000 UT) PO CAPS: 50000.0000 [IU] | ORAL_CAPSULE | ORAL | 1 refills | Status: DC

## 2020-05-25 NOTE — Progress Notes (Signed)
Meds ordered this encounter  Medications  . ergocalciferol (VITAMIN D2) 1.25 MG (50000 UT) capsule    Sig: Take 1 capsule (50,000 Units total) by mouth once a week.    Dispense:  12 capsule    Refill:  1    Order Specific Question:   Supervising Provider    Answer:   [name] [id number]

## 2020-05-26 ENCOUNTER — Telehealth: Payer: Self-pay

## 2020-05-26 NOTE — Telephone Encounter (Signed)
ATC pt x2, no answer, [facility] x2, received "call cannot be completed at this time message" will follow-up.

## 2020-05-26 NOTE — Telephone Encounter (Signed)
-----   Message from Dorena Dew, [place] sent at 05/25/2020  2:19 PM EST ----- Regarding: Lab results Please inform patient that vitamin D was 13.7, which is decreased.  Start Drisdol 50,000 IUs weekly, will recheck vitamin D in 3 months.  Hemoglobin has improved to 9.1, will continue to monitor closely.  Platelets are decreased at 88, watch for any signs of bleeding examples are bleeding gums, blood in stool, or blood in urine.  No other medication changes are warranted at this time.  [name]  APRN, MSN, FNP-C Patient [facility] [address] [place], [place] [address] [phone]

## 2020-05-28 ENCOUNTER — Encounter: Payer: Self-pay | Admitting: Family Medicine

## 2020-05-28 DIAGNOSIS — F17200 Nicotine dependence, unspecified, uncomplicated: Secondary | ICD-10-CM | POA: Insufficient documentation

## 2020-05-31 NOTE — Telephone Encounter (Signed)
UTC letter mailed to pt.

## 2020-06-07 ENCOUNTER — Telehealth: Payer: Self-pay | Admitting: Family Medicine

## 2020-06-08 ENCOUNTER — Other Ambulatory Visit: Payer: Self-pay | Admitting: Family Medicine

## 2020-06-08 DIAGNOSIS — G894 Chronic pain syndrome: Secondary | ICD-10-CM

## 2020-06-08 DIAGNOSIS — M545 Low back pain, unspecified: Secondary | ICD-10-CM

## 2020-06-08 MED ORDER — TIZANIDINE HCL 4 MG PO TABS: 2.0000 mg | ORAL_TABLET | Freq: Three times a day (TID) | ORAL | 1 refills | Status: DC | PRN

## 2020-06-08 MED ORDER — OXYCODONE HCL 10 MG PO TABS: 10.0000 mg | ORAL_TABLET | ORAL | 0 refills | Status: DC | PRN

## 2020-06-08 NOTE — Progress Notes (Signed)
Reviewed PDMP substance reporting system prior to prescribing opiate medications. No inconsistencies noted.   Meds ordered this encounter  Medications  . Oxycodone HCl 10 MG TABS    Sig: Take 1 tablet (10 mg total) by mouth every 4 (four) hours as needed.    Dispense:  60 tablet    Refill:  0    Order Specific Question:   Supervising Provider    Answer:   [name] [id number]  . tiZANidine (ZANAFLEX) 4 MG tablet    Sig: Take 0.5 tablets (2 mg total) by mouth every 8 (eight) hours as needed for muscle spasms.    Dispense:  30 tablet    Refill:  1    Order Specific Question:   Supervising Provider    Answer:   [name] [[id number]]     [name]  APRN, MSN, FNP-C Patient [facility] [address] [place], [place] [address] [phone]

## 2020-06-09 NOTE — Telephone Encounter (Signed)
Done

## 2020-06-17 DIAGNOSIS — I158 Other secondary hypertension: Secondary | ICD-10-CM | POA: Diagnosis not present

## 2020-06-17 DIAGNOSIS — Z992 Dependence on renal dialysis: Secondary | ICD-10-CM | POA: Diagnosis not present

## 2020-06-17 DIAGNOSIS — N186 End stage renal disease: Secondary | ICD-10-CM | POA: Diagnosis not present

## 2020-06-19 DIAGNOSIS — D509 Iron deficiency anemia, unspecified: Secondary | ICD-10-CM | POA: Diagnosis not present

## 2020-06-19 DIAGNOSIS — D689 Coagulation defect, unspecified: Secondary | ICD-10-CM | POA: Diagnosis not present

## 2020-06-19 DIAGNOSIS — Z992 Dependence on renal dialysis: Secondary | ICD-10-CM | POA: Diagnosis not present

## 2020-06-19 DIAGNOSIS — N186 End stage renal disease: Secondary | ICD-10-CM | POA: Diagnosis not present

## 2020-06-19 DIAGNOSIS — D571 Sickle-cell disease without crisis: Secondary | ICD-10-CM | POA: Diagnosis not present

## 2020-06-19 DIAGNOSIS — E876 Hypokalemia: Secondary | ICD-10-CM | POA: Diagnosis not present

## 2020-06-19 DIAGNOSIS — N2581 Secondary hyperparathyroidism of renal origin: Secondary | ICD-10-CM | POA: Diagnosis not present

## 2020-06-19 DIAGNOSIS — D631 Anemia in chronic kidney disease: Secondary | ICD-10-CM | POA: Diagnosis not present

## 2020-06-23 ENCOUNTER — Ambulatory Visit: Payer: Self-pay

## 2020-06-29 ENCOUNTER — Telehealth: Payer: Self-pay | Admitting: Family Medicine

## 2020-06-29 NOTE — Telephone Encounter (Signed)
Done

## 2020-07-05 ENCOUNTER — Telehealth: Payer: HMO | Admitting: Family Medicine

## 2020-07-08 ENCOUNTER — Other Ambulatory Visit: Payer: Self-pay

## 2020-07-08 DIAGNOSIS — N186 End stage renal disease: Secondary | ICD-10-CM

## 2020-07-13 ENCOUNTER — Other Ambulatory Visit (HOSPITAL_COMMUNITY): Payer: HMO

## 2020-07-13 ENCOUNTER — Ambulatory Visit (INDEPENDENT_AMBULATORY_CARE_PROVIDER_SITE_OTHER)
Admission: RE | Admit: 2020-07-13 | Discharge: 2020-07-13 | Disposition: A | Discharge status: Home / Self Care | Payer: HMO | Source: Ambulatory Visit | Attending: Physician Assistant | Admitting: Physician Assistant

## 2020-07-13 ENCOUNTER — Encounter: Payer: Self-pay | Admitting: Vascular Surgery

## 2020-07-13 ENCOUNTER — Ambulatory Visit (HOSPITAL_COMMUNITY)
Admission: RE | Admit: 2020-07-13 | Discharge: 2020-07-13 | Disposition: A | Discharge status: Home / Self Care | Payer: HMO | Source: Ambulatory Visit | Attending: Physician Assistant | Admitting: Physician Assistant

## 2020-07-13 ENCOUNTER — Other Ambulatory Visit: Payer: Self-pay

## 2020-07-13 ENCOUNTER — Ambulatory Visit (INDEPENDENT_AMBULATORY_CARE_PROVIDER_SITE_OTHER): Payer: HMO | Admitting: Physician Assistant

## 2020-07-13 VITALS — BP 107/62 | HR 66 | Temp 97.6°F | Resp 16 | Ht 71.0 in | Wt 154.0 lb

## 2020-07-13 DIAGNOSIS — N186 End stage renal disease: Secondary | ICD-10-CM | POA: Diagnosis not present

## 2020-07-13 MED ORDER — SODIUM CHLORIDE 0.9 % IV SOLN: 250.0000 mL | INTRAVENOUS | Status: DC | PRN

## 2020-07-13 MED ORDER — SODIUM CHLORIDE 0.9% FLUSH: 3.0000 mL | INTRAVENOUS | Status: DC | PRN

## 2020-07-13 MED ORDER — SODIUM CHLORIDE 0.9% FLUSH: 3.0000 mL | Freq: Two times a day (BID) | INTRAVENOUS | Status: DC

## 2020-07-13 NOTE — H&P (View-Only) (Signed)
HISTORY AND PHYSICAL     CC:  dialysis access Requesting Provider:  Dorena Dew, FNP  HPI: This is a 65 y.o. male here for evaluation of his hemodialysis access.  He states that they have to stick it 2-3x sometimes and there are only a couple of people that can stick it.  It is also painful around the middle aneurysmal area.  He states he had a dye study at [facility] about a year ago.  He is unsure if he had an intervention.  He states the fistula was created about 6 years ago and they started using it in the past few years.   The pt is left hand dominant.    Pt is on dialysis.   Days of dialysis if applicable:  T/T/S    HD center if applicable:  [facility] location.   The pt is not on a statin for cholesterol management.  The pt is not on a daily aspirin.  Other AC:  Eliquis for CHF The pt is on BB for hypertension.  The pt is not diabetic.   Tobacco hx:  former  Past Medical History:  Diagnosis Date  . Blood dyscrasia   . Blood transfusion    "I've had a bunch of them"  . CHF (congestive heart failure) ([place])    followed by hf clinic  . Chronic kidney disease (CKD), stage III (moderate) (HCC)   . Dialysis patient ([place]) 04/2019   AV fistula (right arm)  . Gout   . [id number])    "probably weekly" (01/13/2014)  . Legally blind   . Shortness of breath dyspnea   . Sickle cell disease, type SS ([place])   . Swelling abdomen   . Swelling of extremity, left   . Swelling of extremity, right     Past Surgical History:  Procedure Laterality Date  . BASCILIC VEIN TRANSPOSITION Right 04/12/2014   Procedure: BASILIC VEIN TRANSPOSITION;  Surgeon: Elam Dutch, MD;  Location: [place];  Service: Vascular;  Laterality: Right;  . CARDIAC CATHETERIZATION  05/2011  . CARDIOVERSION N/A 06/05/2018   Procedure: CARDIOVERSION;  Surgeon: Jolaine Artist, MD;  Location: [facility] ENDOSCOPY;  Service: Cardiovascular;  Laterality: N/A;  . CARDIOVERSION N/A 10/06/2019   Procedure:  CARDIOVERSION;  Surgeon: Jolaine Artist, MD;  Location: [place];  Service: Cardiovascular;  Laterality: N/A;  . COLONOSCOPY WITH PROPOFOL N/A 09/24/2017   Procedure: COLONOSCOPY WITH PROPOFOL;  Surgeon: Jackquline Denmark, MD;  Location: [facility] ENDOSCOPY;  Service: Endoscopy;  Laterality: N/A;  . ESOPHAGOGASTRODUODENOSCOPY N/A 09/19/2017   Procedure: ESOPHAGOGASTRODUODENOSCOPY (EGD);  Surgeon: Jackquline Denmark, MD;  Location: [facility] ENDOSCOPY;  Service: Endoscopy;  Laterality: N/A;  . LEFT AND RIGHT HEART CATHETERIZATION WITH CORONARY ANGIOGRAM N/A 06/11/2011   Procedure: LEFT AND RIGHT HEART CATHETERIZATION WITH CORONARY ANGIOGRAM;  Surgeon: Larey Dresser, MD;  Location: [facility] CATH LAB;  Service: Cardiovascular;  Laterality: N/A;  . TEE WITHOUT CARDIOVERSION N/A 12/27/2015   Procedure: TRANSESOPHAGEAL ECHOCARDIOGRAM (TEE);  Surgeon: Sueanne Margarita, MD;  Location: [facility] ENDOSCOPY;  Service: Cardiovascular;  Laterality: N/A;    No Known Allergies  Current Outpatient Medications  Medication Sig Dispense Refill  . acetaminophen (TYLENOL) 500 MG tablet Take 1,000 mg by mouth every 6 (six) hours as needed for moderate pain or headache.    . albuterol (VENTOLIN HFA) 108 (90 Base) MCG/ACT inhaler Inhale 2 puffs into the lungs every 6 (six) hours as needed for wheezing or shortness of breath. 8 g 6  . allopurinol (  ZYLOPRIM) 100 MG tablet Take 1 tablet (100 mg total) by mouth daily. 30 tablet 1  . carvedilol (COREG) 12.5 MG tablet TAKE 1 TABLET BY MOUTH 2 (TWO) TIMES DAILY WITH A MEAL. 180 tablet 2  . diphenhydramine-acetaminophen (TYLENOL PM) 25-500 MG TABS tablet Take 2 tablets by mouth at bedtime.    Marland Kitchen ELIQUIS 5 MG TABS tablet TAKE 1 TABLET BY MOUTH TWICE A DAY 60 tablet 11  . ergocalciferol (VITAMIN D2) 1.25 MG (50000 UT) capsule Take 1 capsule (50,000 Units total) by mouth once a week. 12 capsule 1  . ferric citrate (AURYXIA) 1 GM 210 MG(Fe) tablet Take 210 mg by mouth See admin instructions. Take 210 mg with  each meal and snack    . folic acid (FOLVITE) 1 MG tablet TAKE 1 TABLET BY MOUTH EVERY DAY 90 tablet 3  . gabapentin (NEURONTIN) 100 MG capsule Take 1 capsule (100 mg total) by mouth at bedtime. 30 capsule 3  . hydrALAZINE (APRESOLINE) 25 MG tablet Take 1 tablet (25 mg total) by mouth in the morning and at bedtime. DO NOT TAKE AM DOSES ON DIALYSIS DAYS 60 tablet 3  . hydroxyurea (HYDREA) 500 MG capsule TAKE 1 CAPSULE BY MOUTH EVERY DAY WITH FOOD TO MINIMIZE GI SIDE EFFECTS 90 capsule 3  . isosorbide mononitrate (IMDUR) 30 MG 24 hr tablet Take 1 tablet (30 mg total) by mouth daily. DO NOT TAKE AM ON DIALYSIS DAYS. 30 tablet 6  . lidocaine-prilocaine (EMLA) cream Apply 1 application topically daily as needed (port access).     . metolazone (ZAROXOLYN) 5 MG tablet Take 1 tablet (5 mg total) by mouth daily as needed. 10 tablet 0  . Oxycodone HCl 10 MG TABS Take 1 tablet (10 mg total) by mouth every 4 (four) hours as needed. 60 tablet 0  . tiZANidine (ZANAFLEX) 4 MG tablet Take 0.5 tablets (2 mg total) by mouth every 8 (eight) hours as needed for muscle spasms. 30 tablet 1  . torsemide (DEMADEX) 20 MG tablet Take 80 mg by mouth daily.    Marland Kitchen umeclidinium-vilanterol (ANORO ELLIPTA) 62.5-25 MCG/INH AEPB Inhale 1 puff into the lungs daily. 1 each 6   No current facility-administered medications for this visit.    Family History  Problem Relation Age of Onset  . Alcohol abuse Mother   . Liver disease Mother   . Cirrhosis Mother   . Heart attack Mother     Social History   Socioeconomic History  . Marital status: Divorced    Spouse name: Not on file  . Number of children: Not on file  . Years of education: Not on file  . Highest education level: Not on file  Occupational History  . Not on file  Tobacco Use  . Smoking status: Former Smoker    Packs/day: 0.25    Years: 41.00    Pack years: 10.25    Types: Cigarettes    Quit date: 12/08/2015    Years since quitting: 4.6  . Smokeless  tobacco: Never Used  Vaping Use  . Vaping Use: Never used  Substance and Sexual Activity  . Alcohol use: Yes    Alcohol/week: 3.0 standard drinks    Types: 1 Glasses of wine, 2 Cans of beer per week    Comment: occasional  . Drug use: No  . Sexual activity: Not Currently  Other Topics Concern  . Not on file  Social History Narrative  . Not on file   Social Determinants of Health  Financial Resource Strain: Not on file  Food Insecurity: Not on file  Transportation Needs: No Transportation Needs  . Lack of Transportation (Medical): No  . Lack of Transportation (Non-Medical): No  Physical Activity: Sufficiently Active  . Days of Exercise per Week: 5 days  . Minutes of Exercise per Session: 90 min  Stress: Not on file  Social Connections: Not on file  Intimate Partner Violence: Not on file     ROS: '[x]'$  Positive   '[ ]'$  Negative   '[ ]'$  All sytems reviewed and are negative  Cardiac: '[]'$  chest pain/pressure '[]'$  SOB '[]'$  DOE  Vascular: '[]'$  pain in legs while walking '[]'$  pain in feet when lying flat '[]'$  hx of DVT '[]'$  swelling in legs  Pulmonary: '[]'$  asthma '[]'$  wheezing  Neurologic: '[]'$  weakness in '[]'$  arms '[]'$  legs '[]'$  numbness in '[]'$  arms '[]'$  legs '[]'$ difficulty speaking or slurred speech '[]'$  temporary loss of vision in one eye '[]'$  dizziness  Hematologic: '[]'$  bleeding problems  GI '[]'$  GERD  GU: '[x]'$  CKD/renal failure  '[x]'$  HD---'[]'$  M/W/F '[x]'$  T/T/S '[]'$  burning with urination '[]'$  blood in urine  Psychiatric: '[]'$  hx of major depression  Integumentary: '[]'$  rashes '[]'$  ulcers  Constitutional: '[]'$  fever '[]'$  chills  PHYSICAL EXAMINATION:  Today's Vitals   07/13/20 0844  BP: 107/62  Pulse: 66  Resp: 16  Temp: 97.6 F (36.4 C)  TempSrc: Temporal  SpO2: 97%  Weight: 154 lb (69.9 kg)  Height: '5\' 11"'$  (1.803 m)  PainSc: 5   PainLoc: Arm   Body mass index is 21.48 kg/m.  General:  WDWN male in NAD Gait: Not observed HENT: WNL Pulmonary: normal non-labored breathing , without  Rales, rhonchi,  wheezing Cardiac: regular, without  Murmur without carotid bruits Skin: without rashes, without ulcers  Vascular Exam/Pulses:   Right Left  Radial 2+ (normal) 2+ (normal)  Ulnar Unable to palpate Unable to palpate   Extremities:  Fistula is pulsatile distally.  There is a good thrill in the proximal aneurysmal area.  There are 3 areas that are aneurysmal.  There are no scabs or thinning skin. The skin over the aneurysmal areas is mobile.  Musculoskeletal: no muscle wasting or atrophy  Neurologic: A&O X 3; Speech is fluent/normal  Non-Invasive Vascular Imaging:   Upper Extremity Vein Mapping on 07/13/2020: +-----------------+-------------+----------+--------------+  Right Basilic  Diameter (cm)Depth (cm)  Findings    +-----------------+-------------+----------+--------------+  Prox upper arm              not visualized  +-----------------+-------------+----------+--------------+  Mid upper arm              not visualized  +-----------------+-------------+----------+--------------+  Dist upper arm              not visualized  +-----------------+-------------+----------+--------------+  Antecubital fossa  0.47                 +-----------------+-------------+----------+--------------+  Prox forearm     0.29                 +-----------------+-------------+----------+--------------+   +-----------------+-------------+----------+--------------+  Left Cephalic  Diameter (cm)Depth (cm)  Findings    +-----------------+-------------+----------+--------------+  Shoulder       0.15                 +-----------------+-------------+----------+--------------+  Mid upper arm    0.18                 +-----------------+-------------+----------+--------------+  Dist upper arm    0.17                  +-----------------+-------------+----------+--------------+  Antecubital fossa  0.24                 +-----------------+-------------+----------+--------------+  Prox forearm     0.10                 +-----------------+-------------+----------+--------------+  Mid forearm     0.11                 +-----------------+-------------+----------+--------------+  Wrist                  not visualized  +-----------------+-------------+----------+--------------+   +-----------------+-------------+----------+---------+  Left Basilic   Diameter (cm)Depth (cm)Findings   +-----------------+-------------+----------+---------+  Prox upper arm    0.41               +-----------------+-------------+----------+---------+  Mid upper arm    0.41               +-----------------+-------------+----------+---------+  Dist upper arm    0.42               +-----------------+-------------+----------+---------+  Antecubital fossa  0.29        branching  +-----------------+-------------+----------+---------+    ASSESSMENT/PLAN: 65 y.o. male with ESRD here for evaluation of his right upper arm fistula   -pt with pulsatile fistula with pain on dialysis.  Discussed with pt that he would need a fistulogram to evaluate and possible intervention.  He would like to discuss resection of the aneurysmal areas.  I talked with him that we would need to do fistulogram prior to any surgery for aneurysms.  He expressed understanding.  -pt is on dialysis -pt is left hand dominant - will plan for fistulogram on a M/W/F (non dialysis day) -pt is on Eliquis and this would need to be held for a couple of days.    Leontine Locket, [facility] [phone]  Clinic MD:   Scot Dock

## 2020-07-13 NOTE — H&P (View-Only) (Signed)
HISTORY AND PHYSICAL     CC:  dialysis access Requesting Provider:  Dorena Dew, FNP  HPI: This is a 65 y.o. male here for evaluation of his hemodialysis access.  He states that they have to stick it 2-3x sometimes and there are only a couple of people that can stick it.  It is also painful around the middle aneurysmal area.  He states he had a dye study at [facility] about a year ago.  He is unsure if he had an intervention.  He states the fistula was created about 6 years ago and they started using it in the past few years.   The pt is left hand dominant.    Pt is on dialysis.   Days of dialysis if applicable:  T/T/S    HD center if applicable:  [facility] location.   The pt is not on a statin for cholesterol management.  The pt is not on a daily aspirin.  Other AC:  Eliquis for CHF The pt is on BB for hypertension.  The pt is not diabetic.   Tobacco hx:  former  Past Medical History:  Diagnosis Date  . Blood dyscrasia   . Blood transfusion    "I've had a bunch of them"  . CHF (congestive heart failure) ([place])    followed by hf clinic  . Chronic kidney disease (CKD), stage III (moderate) (HCC)   . Dialysis patient ([place]) 04/2019   AV fistula (right arm)  . Gout   . [id number])    "probably weekly" (01/13/2014)  . Legally blind   . Shortness of breath dyspnea   . Sickle cell disease, type SS ([place])   . Swelling abdomen   . Swelling of extremity, left   . Swelling of extremity, right     Past Surgical History:  Procedure Laterality Date  . BASCILIC VEIN TRANSPOSITION Right 04/12/2014   Procedure: BASILIC VEIN TRANSPOSITION;  Surgeon: Elam Dutch, MD;  Location: [place];  Service: Vascular;  Laterality: Right;  . CARDIAC CATHETERIZATION  05/2011  . CARDIOVERSION N/A 06/05/2018   Procedure: CARDIOVERSION;  Surgeon: Jolaine Artist, MD;  Location: [facility] ENDOSCOPY;  Service: Cardiovascular;  Laterality: N/A;  . CARDIOVERSION N/A 10/06/2019   Procedure:  CARDIOVERSION;  Surgeon: Jolaine Artist, MD;  Location: [place];  Service: Cardiovascular;  Laterality: N/A;  . COLONOSCOPY WITH PROPOFOL N/A 09/24/2017   Procedure: COLONOSCOPY WITH PROPOFOL;  Surgeon: Jackquline Denmark, MD;  Location: [facility] ENDOSCOPY;  Service: Endoscopy;  Laterality: N/A;  . ESOPHAGOGASTRODUODENOSCOPY N/A 09/19/2017   Procedure: ESOPHAGOGASTRODUODENOSCOPY (EGD);  Surgeon: Jackquline Denmark, MD;  Location: [facility] ENDOSCOPY;  Service: Endoscopy;  Laterality: N/A;  . LEFT AND RIGHT HEART CATHETERIZATION WITH CORONARY ANGIOGRAM N/A 06/11/2011   Procedure: LEFT AND RIGHT HEART CATHETERIZATION WITH CORONARY ANGIOGRAM;  Surgeon: Larey Dresser, MD;  Location: [facility] CATH LAB;  Service: Cardiovascular;  Laterality: N/A;  . TEE WITHOUT CARDIOVERSION N/A 12/27/2015   Procedure: TRANSESOPHAGEAL ECHOCARDIOGRAM (TEE);  Surgeon: Sueanne Margarita, MD;  Location: [facility] ENDOSCOPY;  Service: Cardiovascular;  Laterality: N/A;    No Known Allergies  Current Outpatient Medications  Medication Sig Dispense Refill  . acetaminophen (TYLENOL) 500 MG tablet Take 1,000 mg by mouth every 6 (six) hours as needed for moderate pain or headache.    . albuterol (VENTOLIN HFA) 108 (90 Base) MCG/ACT inhaler Inhale 2 puffs into the lungs every 6 (six) hours as needed for wheezing or shortness of breath. 8 g 6  . allopurinol (  ZYLOPRIM) 100 MG tablet Take 1 tablet (100 mg total) by mouth daily. 30 tablet 1  . carvedilol (COREG) 12.5 MG tablet TAKE 1 TABLET BY MOUTH 2 (TWO) TIMES DAILY WITH A MEAL. 180 tablet 2  . diphenhydramine-acetaminophen (TYLENOL PM) 25-500 MG TABS tablet Take 2 tablets by mouth at bedtime.    Marland Kitchen ELIQUIS 5 MG TABS tablet TAKE 1 TABLET BY MOUTH TWICE A DAY 60 tablet 11  . ergocalciferol (VITAMIN D2) 1.25 MG (50000 UT) capsule Take 1 capsule (50,000 Units total) by mouth once a week. 12 capsule 1  . ferric citrate (AURYXIA) 1 GM 210 MG(Fe) tablet Take 210 mg by mouth See admin instructions. Take 210 mg with  each meal and snack    . folic acid (FOLVITE) 1 MG tablet TAKE 1 TABLET BY MOUTH EVERY DAY 90 tablet 3  . gabapentin (NEURONTIN) 100 MG capsule Take 1 capsule (100 mg total) by mouth at bedtime. 30 capsule 3  . hydrALAZINE (APRESOLINE) 25 MG tablet Take 1 tablet (25 mg total) by mouth in the morning and at bedtime. DO NOT TAKE AM DOSES ON DIALYSIS DAYS 60 tablet 3  . hydroxyurea (HYDREA) 500 MG capsule TAKE 1 CAPSULE BY MOUTH EVERY DAY WITH FOOD TO MINIMIZE GI SIDE EFFECTS 90 capsule 3  . isosorbide mononitrate (IMDUR) 30 MG 24 hr tablet Take 1 tablet (30 mg total) by mouth daily. DO NOT TAKE AM ON DIALYSIS DAYS. 30 tablet 6  . lidocaine-prilocaine (EMLA) cream Apply 1 application topically daily as needed (port access).     . metolazone (ZAROXOLYN) 5 MG tablet Take 1 tablet (5 mg total) by mouth daily as needed. 10 tablet 0  . Oxycodone HCl 10 MG TABS Take 1 tablet (10 mg total) by mouth every 4 (four) hours as needed. 60 tablet 0  . tiZANidine (ZANAFLEX) 4 MG tablet Take 0.5 tablets (2 mg total) by mouth every 8 (eight) hours as needed for muscle spasms. 30 tablet 1  . torsemide (DEMADEX) 20 MG tablet Take 80 mg by mouth daily.    Marland Kitchen umeclidinium-vilanterol (ANORO ELLIPTA) 62.5-25 MCG/INH AEPB Inhale 1 puff into the lungs daily. 1 each 6   No current facility-administered medications for this visit.    Family History  Problem Relation Age of Onset  . Alcohol abuse Mother   . Liver disease Mother   . Cirrhosis Mother   . Heart attack Mother     Social History   Socioeconomic History  . Marital status: Divorced    Spouse name: Not on file  . Number of children: Not on file  . Years of education: Not on file  . Highest education level: Not on file  Occupational History  . Not on file  Tobacco Use  . Smoking status: Former Smoker    Packs/day: 0.25    Years: 41.00    Pack years: 10.25    Types: Cigarettes    Quit date: 12/08/2015    Years since quitting: 4.6  . Smokeless  tobacco: Never Used  Vaping Use  . Vaping Use: Never used  Substance and Sexual Activity  . Alcohol use: Yes    Alcohol/week: 3.0 standard drinks    Types: 1 Glasses of wine, 2 Cans of beer per week    Comment: occasional  . Drug use: No  . Sexual activity: Not Currently  Other Topics Concern  . Not on file  Social History Narrative  . Not on file   Social Determinants of Health  Financial Resource Strain: Not on file  Food Insecurity: Not on file  Transportation Needs: No Transportation Needs  . Lack of Transportation (Medical): No  . Lack of Transportation (Non-Medical): No  Physical Activity: Sufficiently Active  . Days of Exercise per Week: 5 days  . Minutes of Exercise per Session: 90 min  Stress: Not on file  Social Connections: Not on file  Intimate Partner Violence: Not on file     ROS: '[x]'$  Positive   '[ ]'$  Negative   '[ ]'$  All sytems reviewed and are negative  Cardiac: '[]'$  chest pain/pressure '[]'$  SOB '[]'$  DOE  Vascular: '[]'$  pain in legs while walking '[]'$  pain in feet when lying flat '[]'$  hx of DVT '[]'$  swelling in legs  Pulmonary: '[]'$  asthma '[]'$  wheezing  Neurologic: '[]'$  weakness in '[]'$  arms '[]'$  legs '[]'$  numbness in '[]'$  arms '[]'$  legs '[]'$ difficulty speaking or slurred speech '[]'$  temporary loss of vision in one eye '[]'$  dizziness  Hematologic: '[]'$  bleeding problems  GI '[]'$  GERD  GU: '[x]'$  CKD/renal failure  '[x]'$  HD---'[]'$  M/W/F '[x]'$  T/T/S '[]'$  burning with urination '[]'$  blood in urine  Psychiatric: '[]'$  hx of major depression  Integumentary: '[]'$  rashes '[]'$  ulcers  Constitutional: '[]'$  fever '[]'$  chills  PHYSICAL EXAMINATION:  Today's Vitals   07/13/20 0844  BP: 107/62  Pulse: 66  Resp: 16  Temp: 97.6 F (36.4 C)  TempSrc: Temporal  SpO2: 97%  Weight: 154 lb (69.9 kg)  Height: '5\' 11"'$  (1.803 m)  PainSc: 5   PainLoc: Arm   Body mass index is 21.48 kg/m.  General:  WDWN male in NAD Gait: Not observed HENT: WNL Pulmonary: normal non-labored breathing , without  Rales, rhonchi,  wheezing Cardiac: regular, without  Murmur without carotid bruits Skin: without rashes, without ulcers  Vascular Exam/Pulses:   Right Left  Radial 2+ (normal) 2+ (normal)  Ulnar Unable to palpate Unable to palpate   Extremities:  Fistula is pulsatile distally.  There is a good thrill in the proximal aneurysmal area.  There are 3 areas that are aneurysmal.  There are no scabs or thinning skin. The skin over the aneurysmal areas is mobile.  Musculoskeletal: no muscle wasting or atrophy  Neurologic: A&O X 3; Speech is fluent/normal  Non-Invasive Vascular Imaging:   Upper Extremity Vein Mapping on 07/13/2020: +-----------------+-------------+----------+--------------+  Right Basilic  Diameter (cm)Depth (cm)  Findings    +-----------------+-------------+----------+--------------+  Prox upper arm              not visualized  +-----------------+-------------+----------+--------------+  Mid upper arm              not visualized  +-----------------+-------------+----------+--------------+  Dist upper arm              not visualized  +-----------------+-------------+----------+--------------+  Antecubital fossa  0.47                 +-----------------+-------------+----------+--------------+  Prox forearm     0.29                 +-----------------+-------------+----------+--------------+   +-----------------+-------------+----------+--------------+  Left Cephalic  Diameter (cm)Depth (cm)  Findings    +-----------------+-------------+----------+--------------+  Shoulder       0.15                 +-----------------+-------------+----------+--------------+  Mid upper arm    0.18                 +-----------------+-------------+----------+--------------+  Dist upper arm    0.17                  +-----------------+-------------+----------+--------------+  Antecubital fossa  0.24                 +-----------------+-------------+----------+--------------+  Prox forearm     0.10                 +-----------------+-------------+----------+--------------+  Mid forearm     0.11                 +-----------------+-------------+----------+--------------+  Wrist                  not visualized  +-----------------+-------------+----------+--------------+   +-----------------+-------------+----------+---------+  Left Basilic   Diameter (cm)Depth (cm)Findings   +-----------------+-------------+----------+---------+  Prox upper arm    0.41               +-----------------+-------------+----------+---------+  Mid upper arm    0.41               +-----------------+-------------+----------+---------+  Dist upper arm    0.42               +-----------------+-------------+----------+---------+  Antecubital fossa  0.29        branching  +-----------------+-------------+----------+---------+    ASSESSMENT/PLAN: 65 y.o. male with ESRD here for evaluation of his right upper arm fistula   -pt with pulsatile fistula with pain on dialysis.  Discussed with pt that he would need a fistulogram to evaluate and possible intervention.  He would like to discuss resection of the aneurysmal areas.  I talked with him that we would need to do fistulogram prior to any surgery for aneurysms.  He expressed understanding.  -pt is on dialysis -pt is left hand dominant - will plan for fistulogram on a M/W/F (non dialysis day) -pt is on Eliquis and this would need to be held for a couple of days.    Leontine Locket, [facility] [phone]  Clinic MD:   Scot Dock

## 2020-07-13 NOTE — Progress Notes (Signed)
HISTORY AND PHYSICAL     CC:  dialysis access Requesting Provider:  Dorena Dew, FNP  HPI: This is a 65 y.o. male here for evaluation of his hemodialysis access.  He states that they have to stick it 2-3x sometimes and there are only a couple of people that can stick it.  It is also painful around the middle aneurysmal area.  He states he had a dye study at [facility] about a year ago.  He is unsure if he had an intervention.  He states the fistula was created about 6 years ago and they started using it in the past few years.   The pt is left hand dominant.    Pt is on dialysis.   Days of dialysis if applicable:  T/T/S    HD center if applicable:  [facility] location.   The pt is not on a statin for cholesterol management.  The pt is not on a daily aspirin.  Other AC:  Eliquis for CHF The pt is on BB for hypertension.  The pt is not diabetic.   Tobacco hx:  former  Past Medical History:  Diagnosis Date  . Blood dyscrasia   . Blood transfusion    "I've had a bunch of them"  . CHF (congestive heart failure) ([place])    followed by hf clinic  . Chronic kidney disease (CKD), stage III (moderate) (HCC)   . Dialysis patient ([place]) 04/2019   AV fistula (right arm)  . Gout   . [id number])    "probably weekly" (01/13/2014)  . Legally blind   . Shortness of breath dyspnea   . Sickle cell disease, type SS ([place])   . Swelling abdomen   . Swelling of extremity, left   . Swelling of extremity, right     Past Surgical History:  Procedure Laterality Date  . BASCILIC VEIN TRANSPOSITION Right 04/12/2014   Procedure: BASILIC VEIN TRANSPOSITION;  Surgeon: Elam Dutch, MD;  Location: [place];  Service: Vascular;  Laterality: Right;  . CARDIAC CATHETERIZATION  05/2011  . CARDIOVERSION N/A 06/05/2018   Procedure: CARDIOVERSION;  Surgeon: Jolaine Artist, MD;  Location: [facility] ENDOSCOPY;  Service: Cardiovascular;  Laterality: N/A;  . CARDIOVERSION N/A 10/06/2019   Procedure:  CARDIOVERSION;  Surgeon: Jolaine Artist, MD;  Location: [place];  Service: Cardiovascular;  Laterality: N/A;  . COLONOSCOPY WITH PROPOFOL N/A 09/24/2017   Procedure: COLONOSCOPY WITH PROPOFOL;  Surgeon: Jackquline Denmark, MD;  Location[facility] ENDOSCOPY;  Service: Endoscopy;  Laterality: N/A;  . ESOPHAGOGASTRODUODENOSCOPY N/A 09/19/2017   Procedure: ESOPHAGOGASTRODUODENOSCOPY (EGD);  Surgeon: Jackquline Denmark, MD;  Location: [facility] ENDOSCOPY;  Service: Endoscopy;  Laterality: N/A;  . LEFT AND RIGHT HEART CATHETERIZATION WITH CORONARY ANGIOGRAM N/A 06/11/2011   Procedure: LEFT AND RIGHT HEART CATHETERIZATION WITH CORONARY ANGIOGRAM;  Surgeon: Larey Dresser, MD;  Location: [facility] CATH LAB;  Service: Cardiovascular;  Laterality: N/A;  . TEE WITHOUT CARDIOVERSION N/A 12/27/2015   Procedure: TRANSESOPHAGEAL ECHOCARDIOGRAM (TEE);  Surgeon: Sueanne Margarita, MD;  Location: [facility] ENDOSCOPY;  Service: Cardiovascular;  Laterality: N/A;    No Known Allergies  Current Outpatient Medications  Medication Sig Dispense Refill  . acetaminophen (TYLENOL) 500 MG tablet Take 1,000 mg by mouth every 6 (six) hours as needed for moderate pain or headache.    . albuterol (VENTOLIN HFA) 108 (90 Base) MCG/ACT inhaler Inhale 2 puffs into the lungs every 6 (six) hours as needed for wheezing or shortness of breath. 8 g 6  . allopurinol (  ZYLOPRIM) 100 MG tablet Take 1 tablet (100 mg total) by mouth daily. 30 tablet 1  . carvedilol (COREG) 12.5 MG tablet TAKE 1 TABLET BY MOUTH 2 (TWO) TIMES DAILY WITH A MEAL. 180 tablet 2  . diphenhydramine-acetaminophen (TYLENOL PM) 25-500 MG TABS tablet Take 2 tablets by mouth at bedtime.    Marland Kitchen ELIQUIS 5 MG TABS tablet TAKE 1 TABLET BY MOUTH TWICE A DAY 60 tablet 11  . ergocalciferol (VITAMIN D2) 1.25 MG (50000 UT) capsule Take 1 capsule (50,000 Units total) by mouth once a week. 12 capsule 1  . ferric citrate (AURYXIA) 1 GM 210 MG(Fe) tablet Take 210 mg by mouth See admin instructions. Take 210 mg with  each meal and snack    . folic acid (FOLVITE) 1 MG tablet TAKE 1 TABLET BY MOUTH EVERY DAY 90 tablet 3  . gabapentin (NEURONTIN) 100 MG capsule Take 1 capsule (100 mg total) by mouth at bedtime. 30 capsule 3  . hydrALAZINE (APRESOLINE) 25 MG tablet Take 1 tablet (25 mg total) by mouth in the morning and at bedtime. DO NOT TAKE AM DOSES ON DIALYSIS DAYS 60 tablet 3  . hydroxyurea (HYDREA) 500 MG capsule TAKE 1 CAPSULE BY MOUTH EVERY DAY WITH FOOD TO MINIMIZE GI SIDE EFFECTS 90 capsule 3  . isosorbide mononitrate (IMDUR) 30 MG 24 hr tablet Take 1 tablet (30 mg total) by mouth daily. DO NOT TAKE AM ON DIALYSIS DAYS. 30 tablet 6  . lidocaine-prilocaine (EMLA) cream Apply 1 application topically daily as needed (port access).     . metolazone (ZAROXOLYN) 5 MG tablet Take 1 tablet (5 mg total) by mouth daily as needed. 10 tablet 0  . Oxycodone HCl 10 MG TABS Take 1 tablet (10 mg total) by mouth every 4 (four) hours as needed. 60 tablet 0  . tiZANidine (ZANAFLEX) 4 MG tablet Take 0.5 tablets (2 mg total) by mouth every 8 (eight) hours as needed for muscle spasms. 30 tablet 1  . torsemide (DEMADEX) 20 MG tablet Take 80 mg by mouth daily.    Marland Kitchen umeclidinium-vilanterol (ANORO ELLIPTA) 62.5-25 MCG/INH AEPB Inhale 1 puff into the lungs daily. 1 each 6   No current facility-administered medications for this visit.    Family History  Problem Relation Age of Onset  . Alcohol abuse Mother   . Liver disease Mother   . Cirrhosis Mother   . Heart attack Mother     Social History   Socioeconomic History  . Marital status: Divorced    Spouse name: Not on file  . Number of children: Not on file  . Years of education: Not on file  . Highest education level: Not on file  Occupational History  . Not on file  Tobacco Use  . Smoking status: Former Smoker    Packs/day: 0.25    Years: 41.00    Pack years: 10.25    Types: Cigarettes    Quit date: 12/08/2015    Years since quitting: 4.6  . Smokeless  tobacco: Never Used  Vaping Use  . Vaping Use: Never used  Substance and Sexual Activity  . Alcohol use: Yes    Alcohol/week: 3.0 standard drinks    Types: 1 Glasses of wine, 2 Cans of beer per week    Comment: occasional  . Drug use: No  . Sexual activity: Not Currently  Other Topics Concern  . Not on file  Social History Narrative  . Not on file   Social Determinants of Health  Financial Resource Strain: Not on file  Food Insecurity: Not on file  Transportation Needs: No Transportation Needs  . Lack of Transportation (Medical): No  . Lack of Transportation (Non-Medical): No  Physical Activity: Sufficiently Active  . Days of Exercise per Week: 5 days  . Minutes of Exercise per Session: 90 min  Stress: Not on file  Social Connections: Not on file  Intimate Partner Violence: Not on file     ROS: '[x]'$  Positive   '[ ]'$  Negative   '[ ]'$  All sytems reviewed and are negative  Cardiac: '[]'$  chest pain/pressure '[]'$  SOB '[]'$  DOE  Vascular: '[]'$  pain in legs while walking '[]'$  pain in feet when lying flat '[]'$  hx of DVT '[]'$  swelling in legs  Pulmonary: '[]'$  asthma '[]'$  wheezing  Neurologic: '[]'$  weakness in '[]'$  arms '[]'$  legs '[]'$  numbness in '[]'$  arms '[]'$  legs '[]'$ difficulty speaking or slurred speech '[]'$  temporary loss of vision in one eye '[]'$  dizziness  Hematologic: '[]'$  bleeding problems  GI '[]'$  GERD  GU: '[x]'$  CKD/renal failure  '[x]'$  HD---'[]'$  M/W/F '[x]'$  T/T/S '[]'$  burning with urination '[]'$  blood in urine  Psychiatric: '[]'$  hx of major depression  Integumentary: '[]'$  rashes '[]'$  ulcers  Constitutional: '[]'$  fever '[]'$  chills  PHYSICAL EXAMINATION:  Today's Vitals   07/13/20 0844  BP: 107/62  Pulse: 66  Resp: 16  Temp: 97.6 F (36.4 C)  TempSrc: Temporal  SpO2: 97%  Weight: 154 lb (69.9 kg)  Height: '5\' 11"'$  (1.803 m)  PainSc: 5   PainLoc: Arm   Body mass index is 21.48 kg/m.  General:  WDWN male in NAD Gait: Not observed HENT: WNL Pulmonary: normal non-labored breathing , without  Rales, rhonchi,  wheezing Cardiac: regular, without  Murmur without carotid bruits Skin: without rashes, without ulcers  Vascular Exam/Pulses:   Right Left  Radial 2+ (normal) 2+ (normal)  Ulnar Unable to palpate Unable to palpate   Extremities:  Fistula is pulsatile distally.  There is a good thrill in the proximal aneurysmal area.  There are 3 areas that are aneurysmal.  There are no scabs or thinning skin. The skin over the aneurysmal areas is mobile.  Musculoskeletal: no muscle wasting or atrophy  Neurologic: A&O X 3; Speech is fluent/normal  Non-Invasive Vascular Imaging:   Upper Extremity Vein Mapping on 07/13/2020: +-----------------+-------------+----------+--------------+  Right Basilic  Diameter (cm)Depth (cm)  Findings    +-----------------+-------------+----------+--------------+  Prox upper arm              not visualized  +-----------------+-------------+----------+--------------+  Mid upper arm              not visualized  +-----------------+-------------+----------+--------------+  Dist upper arm              not visualized  +-----------------+-------------+----------+--------------+  Antecubital fossa  0.47                 +-----------------+-------------+----------+--------------+  Prox forearm     0.29                 +-----------------+-------------+----------+--------------+   +-----------------+-------------+----------+--------------+  Left Cephalic  Diameter (cm)Depth (cm)  Findings    +-----------------+-------------+----------+--------------+  Shoulder       0.15                 +-----------------+-------------+----------+--------------+  Mid upper arm    0.18                 +-----------------+-------------+----------+--------------+  Dist upper arm    0.17                  +-----------------+-------------+----------+--------------+  Antecubital fossa  0.24                 +-----------------+-------------+----------+--------------+  Prox forearm     0.10                 +-----------------+-------------+----------+--------------+  Mid forearm     0.11                 +-----------------+-------------+----------+--------------+  Wrist                  not visualized  +-----------------+-------------+----------+--------------+   +-----------------+-------------+----------+---------+  Left Basilic   Diameter (cm)Depth (cm)Findings   +-----------------+-------------+----------+---------+  Prox upper arm    0.41               +-----------------+-------------+----------+---------+  Mid upper arm    0.41               +-----------------+-------------+----------+---------+  Dist upper arm    0.42               +-----------------+-------------+----------+---------+  Antecubital fossa  0.29        branching  +-----------------+-------------+----------+---------+    ASSESSMENT/PLAN: 66 y.o. male with ESRD here for evaluation of his right upper arm fistula   -pt with pulsatile fistula with pain on dialysis.  Discussed with pt that he would need a fistulogram to evaluate and possible intervention.  He would like to discuss resection of the aneurysmal areas.  I talked with him that we would need to do fistulogram prior to any surgery for aneurysms.  He expressed understanding.  -pt is on dialysis -pt is left hand dominant - will plan for fistulogram on a M/W/F (non dialysis day) -pt is on Eliquis and this would need to be held for a couple of days.    Leontine Locket, [facility] [phone]  Clinic MD:   Scot Dock

## 2020-07-18 DIAGNOSIS — N186 End stage renal disease: Secondary | ICD-10-CM | POA: Diagnosis not present

## 2020-07-18 DIAGNOSIS — Z992 Dependence on renal dialysis: Secondary | ICD-10-CM | POA: Diagnosis not present

## 2020-07-18 DIAGNOSIS — I158 Other secondary hypertension: Secondary | ICD-10-CM | POA: Diagnosis not present

## 2020-07-19 DIAGNOSIS — D57819 Other sickle-cell disorders with crisis, unspecified: Secondary | ICD-10-CM | POA: Diagnosis not present

## 2020-07-19 DIAGNOSIS — D649 Anemia, unspecified: Secondary | ICD-10-CM | POA: Diagnosis not present

## 2020-07-19 DIAGNOSIS — D689 Coagulation defect, unspecified: Secondary | ICD-10-CM | POA: Diagnosis not present

## 2020-07-19 DIAGNOSIS — D509 Iron deficiency anemia, unspecified: Secondary | ICD-10-CM | POA: Diagnosis not present

## 2020-07-19 DIAGNOSIS — Z992 Dependence on renal dialysis: Secondary | ICD-10-CM | POA: Diagnosis not present

## 2020-07-19 DIAGNOSIS — N186 End stage renal disease: Secondary | ICD-10-CM | POA: Diagnosis not present

## 2020-07-19 DIAGNOSIS — D631 Anemia in chronic kidney disease: Secondary | ICD-10-CM | POA: Diagnosis not present

## 2020-07-19 DIAGNOSIS — D571 Sickle-cell disease without crisis: Secondary | ICD-10-CM | POA: Diagnosis not present

## 2020-07-19 DIAGNOSIS — E876 Hypokalemia: Secondary | ICD-10-CM | POA: Diagnosis not present

## 2020-07-19 DIAGNOSIS — N2581 Secondary hyperparathyroidism of renal origin: Secondary | ICD-10-CM | POA: Diagnosis not present

## 2020-07-19 DIAGNOSIS — D57219 Sickle-cell/Hb-C disease with crisis, unspecified: Secondary | ICD-10-CM | POA: Diagnosis not present

## 2020-07-25 ENCOUNTER — Other Ambulatory Visit (HOSPITAL_COMMUNITY): Payer: Self-pay | Admitting: Family Medicine

## 2020-07-25 ENCOUNTER — Telehealth: Payer: Self-pay | Admitting: Family Medicine

## 2020-07-25 DIAGNOSIS — G894 Chronic pain syndrome: Secondary | ICD-10-CM

## 2020-07-25 MED ORDER — OXYCODONE HCL 10 MG PO TABS: 10.0000 mg | ORAL_TABLET | ORAL | 0 refills | Status: DC | PRN

## 2020-07-25 NOTE — Progress Notes (Signed)
Reviewed PDMP substance reporting system prior to prescribing opiate medications. No inconsistencies noted.   Meds ordered this encounter  Medications  . Oxycodone HCl 10 MG TABS    Sig: Take 1 tablet (10 mg total) by mouth every 4 (four) hours as needed (pain).    Dispense:  60 tablet    Refill:  0    Order Specific Question:   Supervising Provider    Answer:   [name] [id number]     [name]  APRN, MSN, FNP-C Patient [place] [address] [place], [place] [address] [phone]

## 2020-07-25 NOTE — Telephone Encounter (Signed)
Patient requesting refill on Oxycodone '10mg'$ 

## 2020-07-28 ENCOUNTER — Other Ambulatory Visit (HOSPITAL_COMMUNITY)
Admission: RE | Admit: 2020-07-28 | Discharge: 2020-07-28 | Disposition: A | Discharge status: Home / Self Care | Payer: HMO | Source: Ambulatory Visit | Attending: Vascular Surgery | Admitting: Vascular Surgery

## 2020-07-28 ENCOUNTER — Telehealth: Payer: Self-pay | Admitting: Family Medicine

## 2020-07-28 DIAGNOSIS — Z20822 Contact with and (suspected) exposure to covid-19: Secondary | ICD-10-CM | POA: Insufficient documentation

## 2020-07-28 DIAGNOSIS — Z01812 Encounter for preprocedural laboratory examination: Secondary | ICD-10-CM | POA: Insufficient documentation

## 2020-07-28 LAB — SARS CORONAVIRUS 2 (TAT 6-24 HRS): SARS Coronavirus 2: NEGATIVE

## 2020-07-28 NOTE — Telephone Encounter (Signed)
error 

## 2020-07-29 ENCOUNTER — Encounter (HOSPITAL_COMMUNITY)
Admission: RE | Disposition: A | Discharge status: Home / Self Care | Payer: Self-pay | Source: Home / Self Care | Attending: Vascular Surgery

## 2020-07-29 ENCOUNTER — Encounter (HOSPITAL_COMMUNITY): Payer: Self-pay | Admitting: Vascular Surgery

## 2020-07-29 ENCOUNTER — Ambulatory Visit (HOSPITAL_COMMUNITY)
Admission: RE | Admit: 2020-07-29 | Discharge: 2020-07-29 | Disposition: A | Discharge status: Home / Self Care | Payer: HMO | Attending: Vascular Surgery | Admitting: Vascular Surgery

## 2020-07-29 ENCOUNTER — Other Ambulatory Visit: Payer: Self-pay

## 2020-07-29 DIAGNOSIS — N186 End stage renal disease: Secondary | ICD-10-CM | POA: Insufficient documentation

## 2020-07-29 DIAGNOSIS — T82858A Stenosis of vascular prosthetic devices, implants and grafts, initial encounter: Secondary | ICD-10-CM | POA: Diagnosis not present

## 2020-07-29 DIAGNOSIS — Z79899 Other long term (current) drug therapy: Secondary | ICD-10-CM | POA: Diagnosis not present

## 2020-07-29 DIAGNOSIS — Z87891 Personal history of nicotine dependence: Secondary | ICD-10-CM | POA: Insufficient documentation

## 2020-07-29 DIAGNOSIS — T82898A Other specified complication of vascular prosthetic devices, implants and grafts, initial encounter: Secondary | ICD-10-CM | POA: Diagnosis not present

## 2020-07-29 DIAGNOSIS — I132 Hypertensive heart and chronic kidney disease with heart failure and with stage 5 chronic kidney disease, or end stage renal disease: Secondary | ICD-10-CM | POA: Diagnosis not present

## 2020-07-29 DIAGNOSIS — Z992 Dependence on renal dialysis: Secondary | ICD-10-CM | POA: Diagnosis not present

## 2020-07-29 DIAGNOSIS — Y841 Kidney dialysis as the cause of abnormal reaction of the patient, or of later complication, without mention of misadventure at the time of the procedure: Secondary | ICD-10-CM | POA: Diagnosis not present

## 2020-07-29 DIAGNOSIS — Z7901 Long term (current) use of anticoagulants: Secondary | ICD-10-CM | POA: Insufficient documentation

## 2020-07-29 DIAGNOSIS — I509 Heart failure, unspecified: Secondary | ICD-10-CM | POA: Insufficient documentation

## 2020-07-29 HISTORY — PX: A/V FISTULAGRAM: CATH118298

## 2020-07-29 HISTORY — PX: PERIPHERAL VASCULAR BALLOON ANGIOPLASTY: CATH118281

## 2020-07-29 LAB — POCT I-STAT, CHEM 8
BUN: 45 mg/dL — ABNORMAL HIGH (ref 8–23)
Calcium, Ion: 0.94 mmol/L — ABNORMAL LOW (ref 1.15–1.40)
Chloride: 93 mmol/L — ABNORMAL LOW (ref 98–111)
Creatinine, Ser: 4.8 mg/dL — ABNORMAL HIGH (ref 0.61–1.24)
Glucose, Bld: 94 mg/dL (ref 70–99)
HCT: 25 % — ABNORMAL LOW (ref 39.0–52.0)
Hemoglobin: 8.5 g/dL — ABNORMAL LOW (ref 13.0–17.0)
Potassium: 4.4 mmol/L (ref 3.5–5.1)
Sodium: 135 mmol/L (ref 135–145)
TCO2: 31 mmol/L (ref 22–32)

## 2020-07-29 SURGERY — A/V FISTULAGRAM
Anesthesia: LOCAL | Laterality: Right

## 2020-07-29 MED ORDER — HEPARIN (PORCINE) IN NACL 1000-0.9 UT/500ML-% IV SOLN
INTRAVENOUS | Status: AC
  Filled 2020-07-29: qty 500

## 2020-07-29 MED ORDER — HEPARIN SODIUM (PORCINE) 1000 UNIT/ML IJ SOLN
INTRAMUSCULAR | Status: DC | PRN
  Administered 2020-07-29: 2000 [IU] via INTRAVENOUS

## 2020-07-29 MED ORDER — FENTANYL CITRATE (PF) 100 MCG/2ML IJ SOLN
INTRAMUSCULAR | Status: AC
  Filled 2020-07-29: qty 2

## 2020-07-29 MED ORDER — LIDOCAINE HCL (PF) 1 % IJ SOLN
INTRAMUSCULAR | Status: AC
  Filled 2020-07-29: qty 30

## 2020-07-29 MED ORDER — LIDOCAINE HCL (PF) 1 % IJ SOLN
INTRAMUSCULAR | Status: DC | PRN
  Administered 2020-07-29: 2 mL

## 2020-07-29 MED ORDER — HEPARIN (PORCINE) IN NACL 1000-0.9 UT/500ML-% IV SOLN
INTRAVENOUS | Status: DC | PRN
  Administered 2020-07-29: 500 mL

## 2020-07-29 MED ORDER — IODIXANOL 320 MG/ML IV SOLN
INTRAVENOUS | Status: DC | PRN
  Administered 2020-07-29: 60 mL

## 2020-07-29 MED ORDER — MIDAZOLAM HCL 2 MG/2ML IJ SOLN
INTRAMUSCULAR | Status: AC
  Filled 2020-07-29: qty 2

## 2020-07-29 MED ORDER — HEPARIN SODIUM (PORCINE) 1000 UNIT/ML IJ SOLN
INTRAMUSCULAR | Status: AC
  Filled 2020-07-29: qty 1

## 2020-07-29 MED ORDER — FENTANYL CITRATE (PF) 100 MCG/2ML IJ SOLN
INTRAMUSCULAR | Status: DC | PRN
  Administered 2020-07-29 (×2): 50 ug via INTRAVENOUS

## 2020-07-29 MED ORDER — MIDAZOLAM HCL 2 MG/2ML IJ SOLN
INTRAMUSCULAR | Status: DC | PRN
  Administered 2020-07-29: 1 mg via INTRAVENOUS

## 2020-07-29 SURGICAL SUPPLY — 16 items
BAG SNAP BAND KOVER 36X36 (MISCELLANEOUS) ×2 IMPLANT
BALLN ATLAS 14X40X75 (BALLOONS) ×2
BALLN MUSTANG 10.0X40 75 (BALLOONS) ×2
BALLOON ATLAS 14X40X75 (BALLOONS) ×1 IMPLANT
BALLOON MUSTANG 10.0X40 75 (BALLOONS) ×1 IMPLANT
COVER DOME SNAP 22 D (MISCELLANEOUS) ×2 IMPLANT
KIT ENCORE 26 ADVANTAGE (KITS) ×2 IMPLANT
KIT MICROPUNCTURE NIT STIFF (SHEATH) ×2 IMPLANT
PROTECTION STATION PRESSURIZED (MISCELLANEOUS) ×2
SHEATH PINNACLE R/O II 7F 4CM (SHEATH) ×2 IMPLANT
SHEATH PROBE COVER 6X72 (BAG) ×2 IMPLANT
STATION PROTECTION PRESSURIZED (MISCELLANEOUS) ×1 IMPLANT
STOPCOCK MORSE 400PSI 3WAY (MISCELLANEOUS) ×2 IMPLANT
TRAY PV CATH (CUSTOM PROCEDURE TRAY) ×2 IMPLANT
TUBING CIL FLEX 10 FLL-RA (TUBING) ×2 IMPLANT
WIRE HI TORQ VERSACORE J 260CM (WIRE) ×2 IMPLANT

## 2020-07-29 NOTE — Op Note (Signed)
   PATIENT: Patrick [name]      MRN: [id number] DOB: 1955-11-01    DATE OF PROCEDURE: 07/29/2020  INDICATIONS:    [name] is a 65 y.o. male who was seen in the office with pain during dialysis and difficulty accessing the fistula.  He was set up for fistulogram  PROCEDURE:    1.  Conscious sedation 2.  Ultrasound-guided access to right AV fistula 3.  Fistulogram 4.  Angioplasty of stenosis of right brachiocephalic fistula (10 x 4 Mustang balloon and 12 x 4 Atlas balloon)  SURGEON: Judeth Cornfield. Scot Dock, MD, FACS  ANESTHESIA: Local with sedation  EBL: Minimal  TECHNIQUE: The patient was brought to the peripheral vascular lab and was sedated. The period of conscious sedation was 29 minutes.  During that time period, I was present face-to-face 100% of the time.  The patient was administered 1 mg of Versed and 50 mcg of fentanyl x2. The patient's heart rate, blood pressure, and oxygen saturation were monitored by the nurse continuously during the procedure.  The fistula and right arm were prepped and draped in usual sterile fashion.  Under ultrasound guidance, after the skin was anesthetized, I cannulated the proximal fistula with a micropuncture needle and a micropuncture sheath was introduced over a wire.  By ultrasound the vein had no clot at this level.  A real-time image was obtained and sent to the server.  A fistulogram was obtained to include the fistula from the point of cannulation to include the central veins.  There was no central venous stenosis.  Just peripheral to the aneurysm in the upper arm there was a weblike stenosis.  I elected to address this with angioplasty.  I placed a short 7 French sheath over a Bentson wire after the Bentson wire was used to traverse the lesion.  2000 of IV heparin was given.  Initially I selected a 10 mm x 4 cm Mustang balloon.  This was inflated to nominal pressure for 1 minute.  Retrograde shot was obtained and there were no problems  then to find in the proximal fistula.  Follow-up films showed residual weblike stenosis.  I went back with a 12 mm x 4 cm Atlas balloon which was inflated to 9 atm and I was able to break the web.  However follow-up film showed recoiling.  Given that the patient would require revision of the fistula because of the aneurysm I did not think placing a stent was indicated.  The wire and balloon were removed.  A 4-0 Monocryl was placed around the cannulation site.  No immediate complications were noted.  FINDINGS:   1.  No central venous stenosis. 2.  Large patent left brachiocephalic fistula 3.  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  There was a weblike stenosis just peripheral to this which was ballooned but there was significant recoil.  The thrill was slightly improved but the patient will require excision of the aneurysm in this area can be excised.  CLINICAL NOTE: This patient dialyzes on Tuesdays Thursdays and Saturdays.  He needs to be scheduled for excision of an aneurysm of his left upper arm fistula on a Monday Wednesday or Friday.   Deitra Mayo, MD, FACS [facility]  DATE OF DICTATION:   07/29/2020

## 2020-07-29 NOTE — Discharge Instructions (Signed)
Dialysis Fistulogram, Care After The following information offers guidance on how to care for yourself after your procedure. Your health care provider may also give you more specific instructions. If you have problems or questions, contact your health care provider. What can I expect after the procedure? After the procedure, it is common to have:  A small amount of discomfort in the area where the small tube (catheter) was placed for the procedure.  A small amount of bruising around the fistula.  Sleepiness and tiredness (fatigue). Follow these instructions at home: Puncture site care  Follow instructions from your health care provider about how to take care of the site where catheters were inserted. Make sure you: ? Wash your hands with soap and water for at least 20 seconds before and after you change your bandage (dressing). If soap and water are not available, use hand sanitizer. ? Change your dressing as told by your health care provider. ? Leave stitches (sutures), skin glue, or adhesive strips in place. These skin closures may need to stay in place for 2 weeks or longer. If adhesive strip edges start to loosen and curl up, you may trim the loose edges. Do not remove adhesive strips completely unless your health care provider tells you to do that.  Check your puncture area every day for signs of infection. Check for: ? More redness, swelling, or pain. ? Fluid or blood. ? Warmth. ? Pus or a bad smell.   Activity  Rest as much as you can.  If you were given a sedative during the procedure, it can affect you for several hours. Do not drive or operate machinery until your health care provider says that it is safe.  Do not lift anything that is heavier than 5 lb (2.3 kg), or the limit that you are told, on the day of your procedure.  Do not do anything strenuous with your arm for the rest of the day. Avoid household activities, such as vacuuming.  Return to your normal activities as  told by your health care provider. Ask your health care provider what activities are safe for you. Safety To prevent damage to your graft or fistula:  Do not wear tight-fitting clothing or jewelry on the arm or leg that has your graft or fistula.  Tell all your health care providers that you have a dialysis fistula or graft.  Do not allow blood draws, IVs, or blood pressure readings to be done in the arm that has your fistula or graft.  Do not allow flu shots or vaccinations in the arm with your fistula or graft. General instructions  Take over-the-counter and prescription medicines only as told by your health care provider.  Do not take baths, swim, or use a hot tub until your health care provider approves. Ask your health care provider if you may take showers. You may only be allowed to take sponge baths.  Monitor your dialysis fistula closely. Check to make sure that you can feel a vibration or buzz (a thrill) when you put your fingers over the fistula.  Keep all follow-up visits. This is important. Contact a health care provider if:  You have more redness, swelling, or pain at the site where the catheter was put in.  You have fluid or blood coming from the catheter site.  You have pus or a bad smell coming from the catheter site.  Your catheter site feels warm.  You have a fever or chills. Get help right away if:    You have bleeding from the vascular access site that does not stop.  You feel weak.  You have trouble balancing.  You have trouble moving your arms or legs.  You have problems with your speech or vision.  You can no longer feel a vibration or buzz when you put your fingers over your fistula.  The limb that was used for the procedure swells or becomes painful, cold, blue, or pale white.  You have chest pain or shortness of breath. These symptoms may represent a serious problem that is an emergency. Do not wait to see if the symptoms will go away. Get  medical help right away. Call your local emergency services (911 in the [place] Do not drive yourself to the hospital. Summary  After a dialysis fistulogram, it is common to have a small amount of discomfort or bruising in the area where the small, thin tube (catheter) was placed.  Rest as much as you can after your procedure. Return to your normal activities as told by your health care provider.  Take over-the-counter and prescription medicines only as told by your health care provider.  Follow instructions from your health care provider about how to take care of the site where the catheter was inserted.  Keep all follow-up visits. This is important. This information is not intended to replace advice given to you by your health care provider. Make sure you discuss any questions you have with your health care provider. Document Revised: 01/13/2020 Document Reviewed: 01/13/2020 Elsevier Patient Education  2021 [employer] Inc[employer]  

## 2020-07-29 NOTE — Interval H&P Note (Signed)
History and Physical Interval Note:  07/29/2020 9:02 AM  Patrick [name]  has presented today for surgery, with the diagnosis of instage renal.  The various methods of treatment have been discussed with the patient and family. After consideration of risks, benefits and other options for treatment, the patient has consented to  Procedure(s): A/V FISTULAGRAM - Right Arm (N/A) as a surgical intervention.  The patient's history has been reviewed, patient examined, no change in status, stable for surgery.  I have reviewed the patient's chart and labs.  Questions were answered to the patient's satisfaction.     Deitra Mayo

## 2020-08-01 ENCOUNTER — Other Ambulatory Visit: Payer: Self-pay

## 2020-08-01 ENCOUNTER — Encounter (HOSPITAL_COMMUNITY): Payer: Self-pay

## 2020-08-01 ENCOUNTER — Emergency Department (HOSPITAL_COMMUNITY): Payer: HMO

## 2020-08-01 ENCOUNTER — Inpatient Hospital Stay (HOSPITAL_COMMUNITY)
Admission: EM | Admit: 2020-08-01 | Discharge: 2020-08-05 | DRG: 377 | Disposition: A | Discharge status: Home / Self Care | Payer: HMO | Attending: Internal Medicine | Admitting: Internal Medicine

## 2020-08-01 DIAGNOSIS — M898X9 Other specified disorders of bone, unspecified site: Secondary | ICD-10-CM | POA: Diagnosis present

## 2020-08-01 DIAGNOSIS — Z20822 Contact with and (suspected) exposure to covid-19: Secondary | ICD-10-CM | POA: Diagnosis present

## 2020-08-01 DIAGNOSIS — K3189 Other diseases of stomach and duodenum: Secondary | ICD-10-CM | POA: Diagnosis not present

## 2020-08-01 DIAGNOSIS — N2581 Secondary hyperparathyroidism of renal origin: Secondary | ICD-10-CM | POA: Diagnosis present

## 2020-08-01 DIAGNOSIS — I132 Hypertensive heart and chronic kidney disease with heart failure and with stage 5 chronic kidney disease, or end stage renal disease: Secondary | ICD-10-CM | POA: Diagnosis present

## 2020-08-01 DIAGNOSIS — I272 Pulmonary hypertension, unspecified: Secondary | ICD-10-CM | POA: Diagnosis present

## 2020-08-01 DIAGNOSIS — I4821 Permanent atrial fibrillation: Secondary | ICD-10-CM | POA: Diagnosis present

## 2020-08-01 DIAGNOSIS — I4891 Unspecified atrial fibrillation: Secondary | ICD-10-CM | POA: Diagnosis not present

## 2020-08-01 DIAGNOSIS — D62 Acute posthemorrhagic anemia: Secondary | ICD-10-CM | POA: Diagnosis present

## 2020-08-01 DIAGNOSIS — D7589 Other specified diseases of blood and blood-forming organs: Secondary | ICD-10-CM | POA: Diagnosis present

## 2020-08-01 DIAGNOSIS — E871 Hypo-osmolality and hyponatremia: Secondary | ICD-10-CM | POA: Diagnosis present

## 2020-08-01 DIAGNOSIS — R9431 Abnormal electrocardiogram [ECG] [EKG]: Secondary | ICD-10-CM | POA: Diagnosis present

## 2020-08-01 DIAGNOSIS — L538 Other specified erythematous conditions: Secondary | ICD-10-CM | POA: Diagnosis present

## 2020-08-01 DIAGNOSIS — K449 Diaphragmatic hernia without obstruction or gangrene: Secondary | ICD-10-CM | POA: Diagnosis present

## 2020-08-01 DIAGNOSIS — M109 Gout, unspecified: Secondary | ICD-10-CM | POA: Diagnosis present

## 2020-08-01 DIAGNOSIS — K922 Gastrointestinal hemorrhage, unspecified: Secondary | ICD-10-CM | POA: Diagnosis present

## 2020-08-01 DIAGNOSIS — I517 Cardiomegaly: Secondary | ICD-10-CM | POA: Diagnosis not present

## 2020-08-01 DIAGNOSIS — I5022 Chronic systolic (congestive) heart failure: Secondary | ICD-10-CM | POA: Diagnosis present

## 2020-08-01 DIAGNOSIS — D6959 Other secondary thrombocytopenia: Secondary | ICD-10-CM | POA: Diagnosis present

## 2020-08-01 DIAGNOSIS — Z992 Dependence on renal dialysis: Secondary | ICD-10-CM | POA: Diagnosis not present

## 2020-08-01 DIAGNOSIS — D57419 Sickle-cell thalassemia with crisis, unspecified: Secondary | ICD-10-CM | POA: Diagnosis present

## 2020-08-01 DIAGNOSIS — I10 Essential (primary) hypertension: Secondary | ICD-10-CM | POA: Diagnosis not present

## 2020-08-01 DIAGNOSIS — Q402 Other specified congenital malformations of stomach: Secondary | ICD-10-CM | POA: Diagnosis not present

## 2020-08-01 DIAGNOSIS — F17210 Nicotine dependence, cigarettes, uncomplicated: Secondary | ICD-10-CM | POA: Diagnosis present

## 2020-08-01 DIAGNOSIS — H548 Legal blindness, as defined in USA: Secondary | ICD-10-CM | POA: Diagnosis present

## 2020-08-01 DIAGNOSIS — R0602 Shortness of breath: Secondary | ICD-10-CM | POA: Diagnosis not present

## 2020-08-01 DIAGNOSIS — K254 Chronic or unspecified gastric ulcer with hemorrhage: Principal | ICD-10-CM | POA: Diagnosis present

## 2020-08-01 DIAGNOSIS — N186 End stage renal disease: Secondary | ICD-10-CM | POA: Diagnosis present

## 2020-08-01 DIAGNOSIS — Z79899 Other long term (current) drug therapy: Secondary | ICD-10-CM

## 2020-08-01 DIAGNOSIS — D631 Anemia in chronic kidney disease: Secondary | ICD-10-CM | POA: Diagnosis present

## 2020-08-01 DIAGNOSIS — K319 Disease of stomach and duodenum, unspecified: Secondary | ICD-10-CM | POA: Diagnosis present

## 2020-08-01 DIAGNOSIS — Z7901 Long term (current) use of anticoagulants: Secondary | ICD-10-CM | POA: Diagnosis not present

## 2020-08-01 DIAGNOSIS — D649 Anemia, unspecified: Secondary | ICD-10-CM

## 2020-08-01 DIAGNOSIS — D57219 Sickle-cell/Hb-C disease with crisis, unspecified: Secondary | ICD-10-CM | POA: Diagnosis not present

## 2020-08-01 DIAGNOSIS — D5700 Hb-SS disease with crisis, unspecified: Secondary | ICD-10-CM | POA: Diagnosis present

## 2020-08-01 DIAGNOSIS — I509 Heart failure, unspecified: Secondary | ICD-10-CM

## 2020-08-01 DIAGNOSIS — K2970 Gastritis, unspecified, without bleeding: Secondary | ICD-10-CM | POA: Diagnosis not present

## 2020-08-01 DIAGNOSIS — K920 Hematemesis: Secondary | ICD-10-CM | POA: Diagnosis not present

## 2020-08-01 LAB — CBC WITH DIFFERENTIAL/PLATELET
Abs Immature Granulocytes: 0.07 10*3/uL (ref 0.00–0.07)
Basophils Absolute: 0 10*3/uL (ref 0.0–0.1)
Basophils Relative: 1 %
Eosinophils Absolute: 0.1 10*3/uL (ref 0.0–0.5)
Eosinophils Relative: 2 %
HCT: 17.2 % — ABNORMAL LOW (ref 39.0–52.0)
Hemoglobin: 6.2 g/dL — CL (ref 13.0–17.0)
Immature Granulocytes: 1 %
Lymphocytes Relative: 16 %
Lymphs Abs: 0.8 10*3/uL (ref 0.7–4.0)
MCH: 41.6 pg — ABNORMAL HIGH (ref 26.0–34.0)
MCHC: 36 g/dL (ref 30.0–36.0)
MCV: 115.4 fL — ABNORMAL HIGH (ref 80.0–100.0)
Monocytes Absolute: 0.8 10*3/uL (ref 0.1–1.0)
Monocytes Relative: 17 %
Neutro Abs: 3.1 10*3/uL (ref 1.7–7.7)
Neutrophils Relative %: 63 %
Platelets: 99 10*3/uL — ABNORMAL LOW (ref 150–400)
RBC: 1.49 MIL/uL — ABNORMAL LOW (ref 4.22–5.81)
RDW: 14.7 % (ref 11.5–15.5)
WBC: 4.9 10*3/uL (ref 4.0–10.5)
nRBC: 4.7 % — ABNORMAL HIGH (ref 0.0–0.2)

## 2020-08-01 LAB — COMPREHENSIVE METABOLIC PANEL
ALT: 21 U/L (ref 0–44)
AST: 29 U/L (ref 15–41)
Albumin: 3.5 g/dL (ref 3.5–5.0)
Alkaline Phosphatase: 105 U/L (ref 38–126)
Anion gap: 17 — ABNORMAL HIGH (ref 5–15)
BUN: 89 mg/dL — ABNORMAL HIGH (ref 8–23)
CO2: 23 mmol/L (ref 22–32)
Calcium: 8.4 mg/dL — ABNORMAL LOW (ref 8.9–10.3)
Chloride: 91 mmol/L — ABNORMAL LOW (ref 98–111)
Creatinine, Ser: 6.79 mg/dL — ABNORMAL HIGH (ref 0.61–1.24)
GFR, Estimated: 9 mL/min — ABNORMAL LOW (ref >60)
Glucose, Bld: 85 mg/dL (ref 70–99)
Potassium: 4.6 mmol/L (ref 3.5–5.1)
Sodium: 131 mmol/L — ABNORMAL LOW (ref 135–145)
Total Bilirubin: 2.9 mg/dL — ABNORMAL HIGH (ref 0.3–1.2)
Total Protein: 7.4 g/dL (ref 6.5–8.1)

## 2020-08-01 LAB — RETICULOCYTES
Immature Retic Fract: 33.4 % — ABNORMAL HIGH (ref 2.3–15.9)
RBC.: 1.42 MIL/uL — ABNORMAL LOW (ref 4.22–5.81)
Retic Count, Absolute: 36.4 10*3/uL (ref 19.0–186.0)
Retic Ct Pct: 2.6 % (ref 0.4–3.1)

## 2020-08-01 LAB — POC OCCULT BLOOD, ED: Fecal Occult Bld: POSITIVE — AB

## 2020-08-01 LAB — BRAIN NATRIURETIC PEPTIDE: B Natriuretic Peptide: 2730.5 pg/mL — ABNORMAL HIGH (ref 0.0–100.0)

## 2020-08-01 MED ORDER — PANTOPRAZOLE SODIUM 40 MG IV SOLR: 40.0000 mg | Freq: Two times a day (BID) | INTRAVENOUS | Status: DC

## 2020-08-01 MED ORDER — MORPHINE SULFATE (PF) 4 MG/ML IV SOLN
6.0000 mg | Freq: Once | INTRAVENOUS | Status: AC
  Administered 2020-08-02: 6 mg via INTRAVENOUS
  Filled 2020-08-01: qty 2

## 2020-08-01 MED ORDER — SODIUM CHLORIDE 0.9 % IV SOLN
80.0000 mg | Freq: Once | INTRAVENOUS | Status: AC
  Administered 2020-08-02: 80 mg via INTRAVENOUS
  Filled 2020-08-01: qty 80

## 2020-08-01 MED ORDER — SODIUM CHLORIDE 0.9 % IV SOLN
8.0000 mg/h | INTRAVENOUS | Status: DC
  Administered 2020-08-02: 01:00:00 8 mg/h via INTRAVENOUS
  Filled 2020-08-01: qty 80

## 2020-08-01 MED ORDER — MORPHINE SULFATE (PF) 4 MG/ML IV SOLN
4.0000 mg | INTRAVENOUS | Status: AC
  Administered 2020-08-01: 4 mg via INTRAVENOUS
  Filled 2020-08-01: qty 1

## 2020-08-01 NOTE — ED Provider Notes (Signed)
[facility] DEPT Provider Note   CSN: [id number] Arrival date & time: 08/01/20  1955     History Chief Complaint  Patient presents with  . Sickle Cell Pain Crisis    Patrick [name] is a 65 y.o. male.  Patrick [name] is a 65 y.o. male with a history of sickle cell anemia, CKD on hemodialysis, CHF, GI bleeding, who presents to the emergency department with concern for sickle cell pain crisis.  He reports he went to work today and was feeling nauseated at work, this evening when he got home at around 630 he reports he had at least 4 episodes of emesis which she reports were largely bloody, he describes it as dark red blood.  He reports that after these episodes he has not had any continued vomiting but started to have severe pain in his legs that feels similar to prior pain he has had with his sickle cell pain crises.  He reports he has not had a crisis in quite some time and has not had to be admitted to the hospital for his sickle cell in years.  Reports a prior episode of GI bleeding a few years ago, but was unsure of the cause.  Patient is on eliquis, due to Afib.  He has not noticed blood in his stool or dark black stools but reports he is visually impaired and not able to see this well.  He denies any abdominal pain.  No chest pain, reports some mild shortness of breath primarily with activity, no cough or fever.  Reports he is not sure which medication works best for him for pain but does report getting bad headaches with Dilaudid.  Takes oxycodone at home.  No other aggravating or alleviating factors.        Past Medical History:  Diagnosis Date  . Blood dyscrasia   . Blood transfusion    "I've had a bunch of them"  . CHF (congestive heart failure) ([place])    followed by hf clinic  . Chronic kidney disease (CKD), stage III (moderate) (HCC)   . Dialysis patient ([place]) 04/2019   AV fistula (right arm)  . Gout   . [id number])    "probably  weekly" (01/13/2014)  . Legally blind   . Shortness of breath dyspnea   . Sickle cell disease, type SS ([place])   . Swelling abdomen   . Swelling of extremity, left   . Swelling of extremity, right     Patient Active Problem List   Diagnosis Date Noted  . Acute upper GI bleed 08/02/2020  . Prolonged QT interval 08/02/2020  . Tobacco dependence 05/28/2020  . Muscle spasm of back 01/29/2020  . Chronic systolic CHF (congestive heart failure) ([place]) 09/11/2019  . Hypoxia 06/02/2018  . Symptomatic anemia   . Chronic heart failure with preserved ejection fraction ([place])   . Colon cancer screening 07/16/2017  . Encounter for therapeutic drug monitoring 12/29/2015  . 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  . Permanent atrial fibrillation ([place]) 12/24/2015  . Cough with sputum 10/25/2015  . Muscle tightness-Right arm  05/27/2014  . Arm pain, right 05/06/2014  . End stage renal disease ([place]) 05/06/2014  . Chest pain 01/13/2014  . Vitamin D deficiency 09/25/2013  . CKD (chronic kidney disease), stage IV ([place]) 09/25/2013  . Elevated uric acid in blood 09/25/2013  . Hb-SS disease without crisis ([place]) 09/25/2013  . Anemia 09/25/2013  . Onychomycosis of toenail 08/27/2013  . Cellulitis 08/27/2013  . Hyperglycemia  08/27/2013  . CHF (congestive heart failure) ([place]) 08/19/2013  . Sickle cell anemia ([place]) 11/21/2012  . NSVT (nonsustained ventricular tachycardia) ([place]) 10/21/2011  . Gout 10/19/2011  . Sickle cell crisis ([place]) 10/17/2011  . Acute on chronic systolic and diastolic heart failure, NYHA class 4 ([place]) 10/17/2011  . Essential hypertension 06/07/2011    Past Surgical History:  Procedure Laterality Date  . A/V FISTULAGRAM Right 07/29/2020   Procedure: A/V FISTULAGRAM - Right Arm;  Surgeon: Angelia Mould, MD;  Location: [facility] LAB;  Service: Cardiovascular;  Laterality: Right;  . BASCILIC VEIN TRANSPOSITION Right 04/12/2014   Procedure: BASILIC VEIN TRANSPOSITION;  Surgeon:  Elam Dutch, MD;  Location: [facility];  Service: Vascular;  Laterality: Right;  . CARDIAC CATHETERIZATION  05/2011  . CARDIOVERSION N/A 06/05/2018   Procedure: CARDIOVERSION;  Surgeon: Jolaine Artist, MD;  Location[facility] ENDOSCOPY;  Service: Cardiovascular;  Laterality: N/A;  . CARDIOVERSION N/A 10/06/2019   Procedure: CARDIOVERSION;  Surgeon: Jolaine Artist, MD;  Location: [facility];  Service: Cardiovascular;  Laterality: N/A;  . COLONOSCOPY WITH PROPOFOL N/A 09/24/2017   Procedure: COLONOSCOPY WITH PROPOFOL;  Surgeon: Jackquline Denmark, MD;  Location: [facility] ENDOSCOPY;  Service: Endoscopy;  Laterality: N/A;  . ESOPHAGOGASTRODUODENOSCOPY N/A 09/19/2017   Procedure: ESOPHAGOGASTRODUODENOSCOPY (EGD);  Surgeon: Jackquline Denmark, MD;  Location: [facility] ENDOSCOPY;  Service: Endoscopy;  Laterality: N/A;  . LEFT AND RIGHT HEART CATHETERIZATION WITH CORONARY ANGIOGRAM N/A 06/11/2011   Procedure: LEFT AND RIGHT HEART CATHETERIZATION WITH CORONARY ANGIOGRAM;  Surgeon: Larey Dresser, MD;  Location: [facility] CATH LAB;  Service: Cardiovascular;  Laterality: N/A;  . PERIPHERAL VASCULAR BALLOON ANGIOPLASTY Right 07/29/2020   Procedure: PERIPHERAL VASCULAR BALLOON ANGIOPLASTY;  Surgeon: Angelia Mould, MD;  Location: [facility] CV LAB;  Service: Cardiovascular;  Laterality: Right;  arm fistula  . TEE WITHOUT CARDIOVERSION N/A 12/27/2015   Procedure: TRANSESOPHAGEAL ECHOCARDIOGRAM (TEE);  Surgeon: Sueanne Margarita, MD;  Location: [facility] ENDOSCOPY;  Service: Cardiovascular;  Laterality: N/A;       Family History  Problem Relation Age of Onset  . Alcohol abuse Mother   . Liver disease Mother   . Cirrhosis Mother   . Heart attack Mother     Social History   Tobacco Use  . Smoking status: Former Smoker    Packs/day: 0.25    Years: 41.00    Pack years: 10.25    Types: Cigarettes    Quit date: 12/08/2015    Years since quitting: 4.6  . Smokeless tobacco: Never Used  Vaping Use  . Vaping Use: Never used   Substance Use Topics  . Alcohol use: Yes    Alcohol/week: 3.0 standard drinks    Types: 1 Glasses of wine, 2 Cans of beer per week    Comment: occasional  . Drug use: No    Home Medications Prior to Admission medications   Medication Sig Start Date End Date Taking? Authorizing Provider  acetaminophen (TYLENOL) 500 MG tablet Take 1,000 mg by mouth every 6 (six) hours as needed for moderate pain or headache.   Yes [provider]  albuterol (VENTOLIN HFA) 108 (90 Base) MCG/ACT inhaler Inhale 2 puffs into the lungs every 6 (six) hours as needed for wheezing or shortness of breath. 05/09/20  Yes Freddi Starr, MD  allopurinol (ZYLOPRIM) 100 MG tablet Take 1 tablet (100 mg total) by mouth daily. Patient taking differently: Take 100 mg by mouth 2 (two) times daily. 05/10/20  Yes Dorena Dew, FNP  carvedilol (COREG) 12.5 MG tablet TAKE 1 TABLET BY MOUTH 2 (TWO) TIMES DAILY WITH A MEAL. Patient taking differently: Take 12.5 mg by mouth 2 (two) times daily with a meal. 01/13/20  Yes Bensimhon, Shaune Pascal, MD  diphenhydramine-acetaminophen (TYLENOL PM) 25-500 MG TABS tablet Take 2 tablets by mouth at bedtime as needed (sleep).   Yes [provider]  ELIQUIS 5 MG TABS tablet TAKE 1 TABLET BY MOUTH TWICE A DAY Patient taking differently: Take 5 mg by mouth 2 (two) times daily. 01/25/20  Yes Bensimhon, Shaune Pascal, MD  ferric citrate (AURYXIA) 1 GM 210 MG(Fe) tablet Take 210 mg by mouth See admin instructions. Take 210 mg with each meal and snack   Yes [provider]  folic acid (FOLVITE) 1 MG tablet TAKE 1 TABLET BY MOUTH EVERY DAY Patient taking differently: Take 1 mg by mouth daily. TAKE 1 TABLET BY MOUTH EVERY DAY 05/11/20  Yes Vevelyn Francois, NP  gabapentin (NEURONTIN) 100 MG capsule Take 1 capsule (100 mg total) by mouth at bedtime. Patient taking differently: Take 100 mg by mouth at bedtime as needed (pain). 05/10/20  Yes Dorena Dew, FNP  hydrALAZINE  (APRESOLINE) 25 MG tablet Take 1 tablet (25 mg total) by mouth in the morning and at bedtime. DO NOT TAKE AM DOSES ON DIALYSIS DAYS 01/27/20 05/23/20 Yes Bensimhon, Shaune Pascal, MD  hydroxyurea (HYDREA) 500 MG capsule TAKE 1 CAPSULE BY MOUTH EVERY DAY WITH FOOD TO MINIMIZE GI SIDE EFFECTS Patient taking differently: Take 500 mg by mouth daily. TAKE 1 CAPSULE BY MOUTH EVERY DAY WITH FOOD TO MINIMIZE GI SIDE EFFECTS 02/05/20  Yes Dorena Dew, FNP  isosorbide mononitrate (IMDUR) 30 MG 24 hr tablet Take 1 tablet (30 mg total) by mouth daily. DO NOT TAKE AM ON DIALYSIS DAYS. 01/27/20  Yes Bensimhon, Shaune Pascal, MD  lidocaine-prilocaine (EMLA) cream Apply 1 application topically daily as needed (port access).  09/08/19  Yes [provider]  metolazone (ZAROXOLYN) 5 MG tablet Take 1 tablet (5 mg total) by mouth daily as needed. Patient taking differently: Take 5 mg by mouth daily as needed (fluid). 05/23/20  Yes Bensimhon, Shaune Pascal, MD  Oxycodone HCl 10 MG TABS Take 1 tablet (10 mg total) by mouth every 4 (four) hours as needed (pain). 07/25/20  Yes Dorena Dew, FNP  tiZANidine (ZANAFLEX) 4 MG tablet Take 0.5 tablets (2 mg total) by mouth every 8 (eight) hours as needed for muscle spasms. 06/08/20  Yes Dorena Dew, FNP  torsemide (DEMADEX) 20 MG tablet Take 80 mg by mouth daily. 03/31/20  Yes [provider]    Allergies    Patient has no known allergies.  Review of Systems   Review of Systems  Constitutional: Positive for fatigue. Negative for chills and fever.  HENT: Negative.   Respiratory: Positive for shortness of breath. Negative for cough.   Cardiovascular: Negative for chest pain.  Gastrointestinal: Positive for nausea and vomiting. Negative for abdominal pain, blood in stool and constipation.  Genitourinary: Negative for dysuria and frequency.  Musculoskeletal: Positive for arthralgias and myalgias. Negative for joint swelling.  Skin: Negative for color change and  rash.  Neurological: Negative for dizziness, syncope and light-headedness.    Physical Exam Updated Vital Signs BP 139/89 (BP Location: Right Arm)   Pulse 90   Temp 97.7 F (36.5 C) (Oral)   Resp 15   Ht '5\' 11"'$  (1.803 m)   Wt 69.4 kg   SpO2 97%  BMI 21.34 kg/m   Physical Exam Vitals and nursing note reviewed. Exam conducted with a chaperone present.  Constitutional:      General: He is not in acute distress.    Appearance: Normal appearance. He is well-developed and well-nourished. He is ill-appearing. He is not diaphoretic.     Comments: Alert, chronically ill-appearing but in no acute distress  HENT:     Head: Normocephalic and atraumatic.     Mouth/Throat:     Mouth: Oropharynx is clear and moist. Mucous membranes are moist.     Pharynx: Oropharynx is clear.  Eyes:     General:        Right eye: No discharge.        Left eye: No discharge.     Extraocular Movements: EOM normal.     Comments: Conjunctivae pale  Cardiovascular:     Rate and Rhythm: Normal rate. Rhythm irregular.     Pulses: Intact distal pulses.          Radial pulses are 2+ on the right side and 2+ on the left side.       Dorsalis pedis pulses are 2+ on the right side and 2+ on the left side.     Heart sounds: Normal heart sounds. No murmur heard. No friction rub. No gallop.      Comments: Afib rate controlled Pulmonary:     Effort: Pulmonary effort is normal. No respiratory distress.     Breath sounds: Normal breath sounds. No wheezing or rales.     Comments: Respirations equal and unlabored, patient able to speak in full sentences, lungs clear to auscultation bilaterally Abdominal:     General: Bowel sounds are normal. There is no distension.     Palpations: Abdomen is soft. There is no mass.     Tenderness: There is abdominal tenderness. There is no guarding.     Comments: Abdomen is soft, nondistended, bowel sounds present throughout, there is some very mild epigastric tenderness without any  guarding, all other quadrants nontender, no peritoneal signs.  Genitourinary:    Comments: Chaperone present during rectal exam. Normal rectal tone. Melanic stools present, no bright red blood Musculoskeletal:        General: No deformity or edema.     Cervical back: Neck supple.  Skin:    General: Skin is warm and dry.     Capillary Refill: Capillary refill takes less than 2 seconds.  Neurological:     Mental Status: He is alert and oriented to person, place, and time.     Coordination: Coordination normal.     Comments: Speech is clear, able to follow commands Moves extremities without ataxia, coordination intact  Psychiatric:        Mood and Affect: Mood normal.        Behavior: Behavior normal.     ED Results / Procedures / Treatments   Labs (all labs ordered are listed, but only abnormal results are displayed) Labs Reviewed  COMPREHENSIVE METABOLIC PANEL - Abnormal; Notable for the following components:      Result Value   Sodium 131 (*)    Chloride 91 (*)    BUN 89 (*)    Creatinine, Ser 6.79 (*)    Calcium 8.4 (*)    Total Bilirubin 2.9 (*)    GFR, Estimated 9 (*)    Anion gap 17 (*)    All other components within normal limits  CBC WITH DIFFERENTIAL/PLATELET - Abnormal; Notable for the following components:  RBC 1.49 (*)    Hemoglobin 6.2 (*)    HCT 17.2 (*)    MCV 115.4 (*)    MCH 41.6 (*)    Platelets 99 (*)    nRBC 4.7 (*)    All other components within normal limits  RETICULOCYTES - Abnormal; Notable for the following components:   RBC. 1.42 (*)    Immature Retic Fract 33.4 (*)    All other components within normal limits  BRAIN NATRIURETIC PEPTIDE - Abnormal; Notable for the following components:   B Natriuretic Peptide 2,730.5 (*)    All other components within normal limits  POC OCCULT BLOOD, ED - Abnormal; Notable for the following components:   Fecal Occult Bld POSITIVE (*)    All other components within normal limits  CBC  CBC  CBC  APTT   PROTIME-INR  COMPREHENSIVE METABOLIC PANEL  MAGNESIUM  TYPE AND SCREEN  PREPARE RBC (CROSSMATCH)    EKG EKG Interpretation  Date/Time:  Monday August 01 2020 23:07:47 EST Ventricular Rate:  86 PR Interval:    QRS Duration: 95 QT Interval:  426 QTC Calculation: 510 R Axis:   79 Text Interpretation: Atrial fibrillation Prolonged QT interval Confirmed by Veryl Speak [phone]) on 08/03/2020 7:39:11 AM   Radiology DG Chest Port 1 View  Result Date: 08/01/2020 CLINICAL DATA:  Shortness of breath EXAM: PORTABLE CHEST 1 VIEW COMPARISON:  09/11/2019, CT chest 05/16/2020, 03/24/2019 FINDINGS: Mild cardiomegaly with aortic atherosclerosis. No focal opacity or pleural effusion. No pneumothorax. IMPRESSION: Mild cardiomegaly. Electronically Signed   By: Donavan Foil M.D.   On: 08/01/2020 23:01    Procedures .Critical Care Performed by: Jacqlyn Larsen, PA-C Authorized by: Jacqlyn Larsen, PA-C   Critical care provider statement:    Critical care time (minutes):  45   Critical care was necessary to treat or prevent imminent or life-threatening deterioration of the following conditions:  Circulatory failure (GI bleed requiring blood transfusion)   Critical care was time spent personally by me on the following activities:  Discussions with consultants, evaluation of patient's response to treatment, examination of patient, ordering and performing treatments and interventions, ordering and review of laboratory studies, ordering and review of radiographic studies, pulse oximetry, re-evaluation of patient's condition, obtaining history from patient or surrogate and review of old charts    Medications Ordered in ED Medications  pantoprazole (PROTONIX) injection 40 mg (has no administration in time range)  naloxone [facility]) injection 0.4 mg (has no administration in time range)    And  sodium chloride flush (NS) 0.9 % injection 9 mL (has no administration in time range)  fentaNYL 50 mcg/mL  PCA injection (0 mcg Intravenous Hold 08/02/20 0305)  allopurinol (ZYLOPRIM) tablet 100 mg (has no administration in time range)  carvedilol (COREG) tablet 12.5 mg (has no administration in time range)  isosorbide mononitrate (IMDUR) 24 hr tablet 30 mg (has no administration in time range)  folic acid (FOLVITE) tablet 1 mg (has no administration in time range)  acetaminophen (TYLENOL) tablet 650 mg (has no administration in time range)    Or  acetaminophen (TYLENOL) suppository 650 mg (has no administration in time range)  polyethylene glycol (MIRALAX / GLYCOLAX) packet 17 g (has no administration in time range)  morphine 4 MG/ML injection 4 mg (4 mg Intravenous Given 08/01/20 2302)  morphine 4 MG/ML injection 6 mg (6 mg Intravenous Given 08/02/20 0011)  pantoprazole (PROTONIX) 80 mg in sodium chloride 0.9 % 100 mL IVPB (80 mg Intravenous New Bag/Given  08/02/20 0028)  0.9 %  sodium chloride infusion (10 mL/hr Intravenous New Bag/Given 08/02/20 0031)  fentaNYL (SUBLIMAZE) injection 75 mcg (75 mcg Intravenous Given 08/02/20 0218)  fentaNYL (SUBLIMAZE) injection 75 mcg (75 mcg Intravenous Given 08/02/20 [id number])    ED Course  I have reviewed the triage vital signs and the nursing notes.  Pertinent labs & imaging results that were available during my care of the patient were reviewed by me and considered in my medical decision making (see chart for details).    MDM Rules/Calculators/A&P                         65 y.o. male presents to the ED with complaints of leg pain and hematemesis, this involves an extensive number of treatment options, and is a complaint that carries with it a high risk of complications and morbidity.  Concern for sickle cell pain crisis in the setting of acute blood loss due to hematemesis, history of previous GI bleeding from erosive gastritis.  On anticoagulation.  On arrival pt is nontoxic, vitals stable. Exam significant for melenic stools, no abdominal tenderness, RRR, lungs  CTA  Additional history obtained from EMR. Previous records obtained and reviewed.  I ordered morphine for air pain management, patient reports he gets severe headache with Dilaudid, will start with very low dose since this medication is renally cleared and monitor closely will need to discuss with pharmacy for additional pain management.  Lab Tests:  I Ordered, reviewed, and interpreted labs, which included:  CBC: Hemoglobin of 6.2, was 8.56 days prior, no leukocytosis, platelets 99, at baseline for patient CMP: No significant electrolyte derangements, normal potassium, creatinine and BUN elevated as expected, patient due for dialysis neuro.  No acute derangements of LFTs Hemoccult: Positive BNP: Significantly elevated in setting of known heart failure and renal failure, patient does not currently appear fluid overloaded Retake count is appropriate Type and screen pending  Given acute drop in hemoglobin in the setting of heme positive stools will order 1 unit blood transfusion  Imaging Studies ordered:  I ordered imaging studies which included chest x-ray, I independently visualized and interpreted imaging which showed mild cardiomegaly, no edema  ED Course:   Critical interventions: Blood transfusion, Protonix infusion, pain management for sickle cell crisis  I have attempted to reach Dr. Silverio Decamp with [facility] multiple times, I am unable to send her a secure message via epic, and she has not yet responded to pages, patient is currently hemodynamically stable will need GI intervention but not emergently overnight.  I consulted Dr. Cyd Silence with [facility], and discussed lab and imaging findings, he will see and admit the patient.  Patient will require admission at [facility] for dialysis.  Received call back from Dr. Silverio Decamp, they will see and evaluate the patient in the morning.  Portions of this note were generated with [employer]. Dictation errors may  occur despite best attempts at proofreading.    Final Clinical Impression(s) / ED Diagnoses Final diagnoses:  Sickle cell crisis ([place])  Upper GI bleed  Symptomatic anemia    Rx / DC Orders ED Discharge Orders    None       Janet Berlin 08/04/20 1837    Quintella Reichert, MD 08/05/20 1445

## 2020-08-01 NOTE — ED Notes (Signed)
Lab called to add BNP  

## 2020-08-01 NOTE — ED Triage Notes (Signed)
Patient arrived with complaints of bilateral leg pain related to sickle cell pain crisis that started tonight, last dose of home pain medication was a few days ago. Reports one episode of emesis tonight with dark red blood. Patient also dialysis patient.

## 2020-08-02 ENCOUNTER — Encounter (HOSPITAL_COMMUNITY): Payer: Self-pay | Admitting: Internal Medicine

## 2020-08-02 DIAGNOSIS — D5700 Hb-SS disease with crisis, unspecified: Secondary | ICD-10-CM

## 2020-08-02 DIAGNOSIS — I4821 Permanent atrial fibrillation: Secondary | ICD-10-CM

## 2020-08-02 DIAGNOSIS — D62 Acute posthemorrhagic anemia: Secondary | ICD-10-CM | POA: Diagnosis present

## 2020-08-02 DIAGNOSIS — K3189 Other diseases of stomach and duodenum: Secondary | ICD-10-CM | POA: Diagnosis not present

## 2020-08-02 DIAGNOSIS — N186 End stage renal disease: Secondary | ICD-10-CM | POA: Diagnosis present

## 2020-08-02 DIAGNOSIS — D7589 Other specified diseases of blood and blood-forming organs: Secondary | ICD-10-CM | POA: Diagnosis present

## 2020-08-02 DIAGNOSIS — N2581 Secondary hyperparathyroidism of renal origin: Secondary | ICD-10-CM | POA: Diagnosis present

## 2020-08-02 DIAGNOSIS — M109 Gout, unspecified: Secondary | ICD-10-CM

## 2020-08-02 DIAGNOSIS — I10 Essential (primary) hypertension: Secondary | ICD-10-CM

## 2020-08-02 DIAGNOSIS — I5022 Chronic systolic (congestive) heart failure: Secondary | ICD-10-CM

## 2020-08-02 DIAGNOSIS — Z20822 Contact with and (suspected) exposure to covid-19: Secondary | ICD-10-CM | POA: Diagnosis present

## 2020-08-02 DIAGNOSIS — F17210 Nicotine dependence, cigarettes, uncomplicated: Secondary | ICD-10-CM | POA: Diagnosis present

## 2020-08-02 DIAGNOSIS — Z992 Dependence on renal dialysis: Secondary | ICD-10-CM | POA: Diagnosis not present

## 2020-08-02 DIAGNOSIS — R9431 Abnormal electrocardiogram [ECG] [EKG]: Secondary | ICD-10-CM | POA: Diagnosis present

## 2020-08-02 DIAGNOSIS — K449 Diaphragmatic hernia without obstruction or gangrene: Secondary | ICD-10-CM | POA: Diagnosis present

## 2020-08-02 DIAGNOSIS — Z7901 Long term (current) use of anticoagulants: Secondary | ICD-10-CM | POA: Diagnosis not present

## 2020-08-02 DIAGNOSIS — M898X9 Other specified disorders of bone, unspecified site: Secondary | ICD-10-CM | POA: Diagnosis present

## 2020-08-02 DIAGNOSIS — D6959 Other secondary thrombocytopenia: Secondary | ICD-10-CM | POA: Diagnosis present

## 2020-08-02 DIAGNOSIS — K920 Hematemesis: Secondary | ICD-10-CM | POA: Diagnosis not present

## 2020-08-02 DIAGNOSIS — K254 Chronic or unspecified gastric ulcer with hemorrhage: Secondary | ICD-10-CM | POA: Diagnosis present

## 2020-08-02 DIAGNOSIS — K922 Gastrointestinal hemorrhage, unspecified: Secondary | ICD-10-CM | POA: Diagnosis present

## 2020-08-02 DIAGNOSIS — Q402 Other specified congenital malformations of stomach: Secondary | ICD-10-CM | POA: Diagnosis not present

## 2020-08-02 DIAGNOSIS — I272 Pulmonary hypertension, unspecified: Secondary | ICD-10-CM | POA: Diagnosis present

## 2020-08-02 DIAGNOSIS — I132 Hypertensive heart and chronic kidney disease with heart failure and with stage 5 chronic kidney disease, or end stage renal disease: Secondary | ICD-10-CM | POA: Diagnosis present

## 2020-08-02 DIAGNOSIS — Z79899 Other long term (current) drug therapy: Secondary | ICD-10-CM | POA: Diagnosis not present

## 2020-08-02 DIAGNOSIS — K319 Disease of stomach and duodenum, unspecified: Secondary | ICD-10-CM | POA: Diagnosis present

## 2020-08-02 DIAGNOSIS — L538 Other specified erythematous conditions: Secondary | ICD-10-CM | POA: Diagnosis present

## 2020-08-02 DIAGNOSIS — D631 Anemia in chronic kidney disease: Secondary | ICD-10-CM | POA: Diagnosis present

## 2020-08-02 DIAGNOSIS — E871 Hypo-osmolality and hyponatremia: Secondary | ICD-10-CM | POA: Diagnosis present

## 2020-08-02 DIAGNOSIS — H548 Legal blindness, as defined in USA: Secondary | ICD-10-CM | POA: Diagnosis present

## 2020-08-02 LAB — PREPARE RBC (CROSSMATCH)

## 2020-08-02 LAB — COMPREHENSIVE METABOLIC PANEL
ALT: 17 U/L (ref 0–44)
AST: 24 U/L (ref 15–41)
Albumin: 3.3 g/dL — ABNORMAL LOW (ref 3.5–5.0)
Alkaline Phosphatase: 92 U/L (ref 38–126)
Anion gap: 16 — ABNORMAL HIGH (ref 5–15)
BUN: 98 mg/dL — ABNORMAL HIGH (ref 8–23)
CO2: 22 mmol/L (ref 22–32)
Calcium: 8.3 mg/dL — ABNORMAL LOW (ref 8.9–10.3)
Chloride: 95 mmol/L — ABNORMAL LOW (ref 98–111)
Creatinine, Ser: 7.09 mg/dL — ABNORMAL HIGH (ref 0.61–1.24)
GFR, Estimated: 8 mL/min — ABNORMAL LOW (ref >60)
Glucose, Bld: 89 mg/dL (ref 70–99)
Potassium: 4.9 mmol/L (ref 3.5–5.1)
Sodium: 133 mmol/L — ABNORMAL LOW (ref 135–145)
Total Bilirubin: 2.6 mg/dL — ABNORMAL HIGH (ref 0.3–1.2)
Total Protein: 6.8 g/dL (ref 6.5–8.1)

## 2020-08-02 LAB — CBC
HCT: 19.3 % — ABNORMAL LOW (ref 39.0–52.0)
HCT: 20.8 % — ABNORMAL LOW (ref 39.0–52.0)
HCT: 19.5 % — ABNORMAL LOW (ref 39.0–52.0)
Hemoglobin: 7.3 g/dL — ABNORMAL LOW (ref 13.0–17.0)
Hemoglobin: 7.6 g/dL — ABNORMAL LOW (ref 13.0–17.0)
Hemoglobin: 7.1 g/dL — ABNORMAL LOW (ref 13.0–17.0)
MCH: 41 pg — ABNORMAL HIGH (ref 26.0–34.0)
MCH: 39.6 pg — ABNORMAL HIGH (ref 26.0–34.0)
MCH: 38.8 pg — ABNORMAL HIGH (ref 26.0–34.0)
MCHC: 37.2 g/dL — ABNORMAL HIGH (ref 30.0–36.0)
MCHC: 36.5 g/dL — ABNORMAL HIGH (ref 30.0–36.0)
MCHC: 36.4 g/dL — ABNORMAL HIGH (ref 30.0–36.0)
MCV: 108.4 fL — ABNORMAL HIGH (ref 80.0–100.0)
MCV: 108.3 fL — ABNORMAL HIGH (ref 80.0–100.0)
MCV: 106.6 fL — ABNORMAL HIGH (ref 80.0–100.0)
Platelets: 101 10*3/uL — ABNORMAL LOW (ref 150–400)
Platelets: 95 10*3/uL — ABNORMAL LOW (ref 150–400)
Platelets: 96 10*3/uL — ABNORMAL LOW (ref 150–400)
RBC: 1.78 MIL/uL — ABNORMAL LOW (ref 4.22–5.81)
RBC: 1.92 MIL/uL — ABNORMAL LOW (ref 4.22–5.81)
RBC: 1.83 MIL/uL — ABNORMAL LOW (ref 4.22–5.81)
RDW: 19 % — ABNORMAL HIGH (ref 11.5–15.5)
RDW: 19 % — ABNORMAL HIGH (ref 11.5–15.5)
RDW: 18.8 % — ABNORMAL HIGH (ref 11.5–15.5)
WBC: 4.8 10*3/uL (ref 4.0–10.5)
WBC: 4.9 10*3/uL (ref 4.0–10.5)
WBC: 4.8 10*3/uL (ref 4.0–10.5)
nRBC: 6.3 % — ABNORMAL HIGH (ref 0.0–0.2)
nRBC: 6.3 % — ABNORMAL HIGH (ref 0.0–0.2)
nRBC: 7.1 % — ABNORMAL HIGH (ref 0.0–0.2)

## 2020-08-02 LAB — POC SARS CORONAVIRUS 2 AG -  ED: SARS Coronavirus 2 Ag: NEGATIVE

## 2020-08-02 LAB — PROTIME-INR
INR: 1.6 — ABNORMAL HIGH (ref 0.8–1.2)
Prothrombin Time: 18.1 seconds — ABNORMAL HIGH (ref 11.4–15.2)

## 2020-08-02 LAB — APTT: aPTT: 37 seconds — ABNORMAL HIGH (ref 24–36)

## 2020-08-02 LAB — MAGNESIUM: Magnesium: 2.2 mg/dL (ref 1.7–2.4)

## 2020-08-02 LAB — IRON AND TIBC
Iron: 131 ug/dL (ref 45–182)
Saturation Ratios: 47 % — ABNORMAL HIGH (ref 17.9–39.5)
TIBC: 281 ug/dL (ref 250–450)
UIBC: 150 ug/dL

## 2020-08-02 LAB — PHOSPHORUS: Phosphorus: 5.5 mg/dL — ABNORMAL HIGH (ref 2.5–4.6)

## 2020-08-02 MED ORDER — ACETAMINOPHEN 325 MG PO TABS: 650.0000 mg | ORAL_TABLET | Freq: Four times a day (QID) | ORAL | Status: DC | PRN

## 2020-08-02 MED ORDER — CHLORHEXIDINE GLUCONATE CLOTH 2 % EX PADS
6.0000 | MEDICATED_PAD | Freq: Every day | CUTANEOUS | Status: DC
  Administered 2020-08-04: 6 via TOPICAL

## 2020-08-02 MED ORDER — ALTEPLASE 2 MG IJ SOLR: 2.0000 mg | Freq: Once | INTRAMUSCULAR | Status: DC | PRN

## 2020-08-02 MED ORDER — SODIUM CHLORIDE 0.9 % IV SOLN: INTRAVENOUS | Status: DC | PRN

## 2020-08-02 MED ORDER — PENTAFLUOROPROP-TETRAFLUOROETH EX AERO: 1.0000 "application " | INHALATION_SPRAY | CUTANEOUS | Status: DC | PRN

## 2020-08-02 MED ORDER — FENTANYL CITRATE (PF) 100 MCG/2ML IJ SOLN
75.0000 ug | INTRAMUSCULAR | Status: DC | PRN
  Administered 2020-08-02: 75 ug via INTRAVENOUS
  Filled 2020-08-02: qty 2

## 2020-08-02 MED ORDER — SODIUM CHLORIDE 0.9 % IV SOLN
25.0000 mg | INTRAVENOUS | Status: DC | PRN
  Filled 2020-08-02: qty 0.5

## 2020-08-02 MED ORDER — ISOSORBIDE MONONITRATE ER 30 MG PO TB24: 30.0000 mg | ORAL_TABLET | ORAL | Status: DC

## 2020-08-02 MED ORDER — FENTANYL CITRATE (PF) 100 MCG/2ML IJ SOLN
75.0000 ug | Freq: Once | INTRAMUSCULAR | Status: AC
  Administered 2020-08-02: 75 ug via INTRAVENOUS
  Filled 2020-08-02: qty 2

## 2020-08-02 MED ORDER — DIPHENHYDRAMINE HCL 25 MG PO CAPS
25.0000 mg | ORAL_CAPSULE | ORAL | Status: DC | PRN
  Administered 2020-08-02: 25 mg via ORAL
  Filled 2020-08-02: qty 1

## 2020-08-02 MED ORDER — FENTANYL 50 MCG/ML IV PCA SOLN
INTRAVENOUS | Status: DC
  Administered 2020-08-02 (×2): 50 ug via INTRAVENOUS
  Administered 2020-08-03: 75 ug/h via INTRAVENOUS
  Administered 2020-08-03: 75 ug via INTRAVENOUS
  Administered 2020-08-03: 50 ug via INTRAVENOUS
  Administered 2020-08-04: 225 ug via INTRAVENOUS
  Filled 2020-08-02 (×2): qty 20

## 2020-08-02 MED ORDER — POLYETHYLENE GLYCOL 3350 17 G PO PACK
17.0000 g | PACK | Freq: Every day | ORAL | Status: DC | PRN
  Filled 2020-08-02 (×2): qty 1

## 2020-08-02 MED ORDER — CARVEDILOL 12.5 MG PO TABS
12.5000 mg | ORAL_TABLET | Freq: Two times a day (BID) | ORAL | Status: DC
  Administered 2020-08-02 – 2020-08-05 (×4): 12.5 mg via ORAL
  Filled 2020-08-02 (×4): qty 1

## 2020-08-02 MED ORDER — SODIUM CHLORIDE 0.45 % IV SOLN: INTRAVENOUS | Status: DC

## 2020-08-02 MED ORDER — HYDROXYUREA 500 MG PO CAPS: 500.0000 mg | ORAL_CAPSULE | Freq: Every day | ORAL | Status: DC

## 2020-08-02 MED ORDER — ISOSORBIDE MONONITRATE ER 30 MG PO TB24
30.0000 mg | ORAL_TABLET | Freq: Every day | ORAL | Status: DC
  Administered 2020-08-03 – 2020-08-05 (×3): 30 mg via ORAL
  Filled 2020-08-02 (×4): qty 1

## 2020-08-02 MED ORDER — DOXERCALCIFEROL 4 MCG/2ML IV SOLN
6.0000 ug | INTRAVENOUS | Status: DC
  Filled 2020-08-02 (×2): qty 4

## 2020-08-02 MED ORDER — SODIUM CHLORIDE 0.9 % IV SOLN: 100.0000 mL | INTRAVENOUS | Status: DC | PRN

## 2020-08-02 MED ORDER — LIDOCAINE-PRILOCAINE 2.5-2.5 % EX CREA: 1.0000 "application " | TOPICAL_CREAM | CUTANEOUS | Status: DC | PRN

## 2020-08-02 MED ORDER — DOXERCALCIFEROL 4 MCG/2ML IV SOLN
INTRAVENOUS | Status: AC
  Administered 2020-08-02: 6 ug via INTRAVENOUS
  Filled 2020-08-02: qty 4

## 2020-08-02 MED ORDER — HEPARIN SODIUM (PORCINE) 1000 UNIT/ML DIALYSIS: 1000.0000 [IU] | INTRAMUSCULAR | Status: DC | PRN

## 2020-08-02 MED ORDER — SODIUM CHLORIDE 0.9% FLUSH
9.0000 mL | INTRAVENOUS | Status: DC | PRN
Start: 2020-08-02 — End: 2020-08-04
  Administered 2020-08-03 – 2020-08-04 (×2): 9 mL via INTRAVENOUS

## 2020-08-02 MED ORDER — DIPHENHYDRAMINE HCL 50 MG/ML IJ SOLN
12.5000 mg | Freq: Once | INTRAMUSCULAR | Status: AC
  Administered 2020-08-02: 12.5 mg via INTRAVENOUS
  Filled 2020-08-02: qty 1

## 2020-08-02 MED ORDER — SODIUM CHLORIDE 0.9 % IV BOLUS: 250.0000 mL | Freq: Once | INTRAVENOUS | Status: DC

## 2020-08-02 MED ORDER — FOLIC ACID 1 MG PO TABS
1.0000 mg | ORAL_TABLET | Freq: Every day | ORAL | Status: DC
  Administered 2020-08-02 – 2020-08-05 (×4): 1 mg via ORAL
  Filled 2020-08-02 (×4): qty 1

## 2020-08-02 MED ORDER — ACETAMINOPHEN 650 MG RE SUPP: 650.0000 mg | Freq: Four times a day (QID) | RECTAL | Status: DC | PRN

## 2020-08-02 MED ORDER — CINACALCET HCL 30 MG PO TABS
30.0000 mg | ORAL_TABLET | ORAL | Status: DC
  Administered 2020-08-04: 30 mg via ORAL
  Filled 2020-08-02: qty 1

## 2020-08-02 MED ORDER — NALOXONE HCL 0.4 MG/ML IJ SOLN: 0.4000 mg | INTRAMUSCULAR | Status: DC | PRN

## 2020-08-02 MED ORDER — PANTOPRAZOLE SODIUM 40 MG IV SOLR
40.0000 mg | Freq: Two times a day (BID) | INTRAVENOUS | Status: DC
  Administered 2020-08-02 – 2020-08-03 (×4): 40 mg via INTRAVENOUS
  Filled 2020-08-02 (×4): qty 40

## 2020-08-02 MED ORDER — FENTANYL CITRATE (PF) 100 MCG/2ML IJ SOLN
INTRAMUSCULAR | Status: AC
  Administered 2020-08-02: 75 ug via INTRAVENOUS
  Filled 2020-08-02: qty 2

## 2020-08-02 MED ORDER — SODIUM CHLORIDE 0.9 % IV SOLN
10.0000 mL/h | Freq: Once | INTRAVENOUS | Status: AC
  Administered 2020-08-02: 10 mL/h via INTRAVENOUS

## 2020-08-02 MED ORDER — DARBEPOETIN ALFA 200 MCG/0.4ML IJ SOSY
PREFILLED_SYRINGE | INTRAMUSCULAR | Status: AC
  Filled 2020-08-02: qty 0.4

## 2020-08-02 MED ORDER — ONDANSETRON HCL 4 MG/2ML IJ SOLN: 4.0000 mg | Freq: Four times a day (QID) | INTRAMUSCULAR | Status: DC | PRN

## 2020-08-02 MED ORDER — ALLOPURINOL 100 MG PO TABS
100.0000 mg | ORAL_TABLET | Freq: Two times a day (BID) | ORAL | Status: DC
  Administered 2020-08-02 – 2020-08-05 (×7): 100 mg via ORAL
  Filled 2020-08-02 (×7): qty 1

## 2020-08-02 MED ORDER — LIDOCAINE HCL (PF) 1 % IJ SOLN: 5.0000 mL | INTRAMUSCULAR | Status: DC | PRN

## 2020-08-02 MED ORDER — DARBEPOETIN ALFA 200 MCG/0.4ML IJ SOSY
200.0000 ug | PREFILLED_SYRINGE | INTRAMUSCULAR | Status: DC
  Administered 2020-08-02: 200 ug via INTRAVENOUS

## 2020-08-02 NOTE — ED Notes (Signed)
VS entered frin 0930 are post-transfusion VS, VS WNL

## 2020-08-02 NOTE — ED Notes (Signed)
Attempted to give report x 1, RN was busy, will try again in 10 min

## 2020-08-02 NOTE — Consult Note (Signed)
[facility] Renal Consultation Note    Indication for Consultation:  Management of ESRD/hemodialysis; anemia, hypertension/volume and secondary hyperparathyroidism  [id number], Asencion Partridge, FNP  HPI: Patrick [name] is a 65 y.o. male. ESRD on HD TTS at [facility].  Past medical history significant for chronic systolic CHF, sickle cell disease, A fib on eliquis, pulmonary HTN, HTN, gout, legally blind, Hx GIB/PUD.  Patient is compliant with prescribed dialysis regimen but has large intradialytic weight gains.  Typically has UF 4-5L with each dialysis.  Patient recently had fistulogram with pta completed on 2/11 by Dr. Scot Dock.   Patient presented to the ED at [facility] one day ago due to lower extremity pain 2/2 sickle cell crisis and nausea followed by hematemesis x 4 at home yesterday.  He was admitted to as inpatient and transferred to [facility] today.  Pertinent findings since admission include  +FOBT, Hgb 6.2 (drop from 8.5, 3 days prior), CXR with mild cardiomegaly and no acute abnormalities.  Today, seen and examined at bedside.  Denies n/v/d, CP and abdominal pain.  Admits to intermittent SOB, weakness and fatigue.  Pain in LE about the same as admit, rated about 7/10.     Past Medical History:  Diagnosis Date  . Blood dyscrasia   . Blood transfusion    "I've had a bunch of them"  . CHF (congestive heart failure) ([place])    followed by hf clinic  . Chronic kidney disease (CKD), stage III (moderate) (HCC)   . Dialysis patient ([place]) 04/2019   AV fistula (right arm)  . Gout   . [id number])    "probably weekly" (01/13/2014)  . Legally blind   . Shortness of breath dyspnea   . Sickle cell disease, type SS ([place])   . Swelling abdomen   . Swelling of extremity, left   . Swelling of extremity, right    Past Surgical History:  Procedure Laterality Date  . A/V FISTULAGRAM Right 07/29/2020   Procedure: A/V FISTULAGRAM - Right Arm;  Surgeon: Angelia Mould, MD;   Location: [place] CV LAB;  Service: Cardiovascular;  Laterality: Right;  . BASCILIC VEIN TRANSPOSITION Right 04/12/2014   Procedure: BASILIC VEIN TRANSPOSITION;  Surgeon: Elam Dutch, MD;  Location: [place];  Service: Vascular;  Laterality: Right;  . CARDIAC CATHETERIZATION  05/2011  . CARDIOVERSION N/A 06/05/2018   Procedure: CARDIOVERSION;  Surgeon: Jolaine Artist, MD;  Location: [facility] ENDOSCOPY;  Service: Cardiovascular;  Laterality: N/A;  . CARDIOVERSION N/A 10/06/2019   Procedure: CARDIOVERSION;  Surgeon: Jolaine Artist, MD;  Location: [place];  Service: Cardiovascular;  Laterality: N/A;  . COLONOSCOPY WITH PROPOFOL N/A 09/24/2017   Procedure: COLONOSCOPY WITH PROPOFOL;  Surgeon: Jackquline Denmark, MD;  Location: [facility] ENDOSCOPY;  Service: Endoscopy;  Laterality: N/A;  . ESOPHAGOGASTRODUODENOSCOPY N/A 09/19/2017   Procedure: ESOPHAGOGASTRODUODENOSCOPY (EGD);  Surgeon: Jackquline Denmark, MD;  Location: [facility] ENDOSCOPY;  Service: Endoscopy;  Laterality: N/A;  . LEFT AND RIGHT HEART CATHETERIZATION WITH CORONARY ANGIOGRAM N/A 06/11/2011   Procedure: LEFT AND RIGHT HEART CATHETERIZATION WITH CORONARY ANGIOGRAM;  Surgeon: Larey Dresser, MD;  Location: [facility] CATH LAB;  Service: Cardiovascular;  Laterality: N/A;  . PERIPHERAL VASCULAR BALLOON ANGIOPLASTY Right 07/29/2020   Procedure: PERIPHERAL VASCULAR BALLOON ANGIOPLASTY;  Surgeon: Angelia Mould, MD;  Location: [place] CV LAB;  Service: Cardiovascular;  Laterality: Right;  arm fistula  . TEE WITHOUT CARDIOVERSION N/A 12/27/2015   Procedure: TRANSESOPHAGEAL ECHOCARDIOGRAM (TEE);  Surgeon: Sueanne Margarita, MD;  Location: [facility]  ENDOSCOPY;  Service: Cardiovascular;  Laterality: N/A;   Family History  Problem Relation Age of Onset  . Alcohol abuse Mother   . Liver disease Mother   . Cirrhosis Mother   . Heart attack Mother    Social History:  reports that he quit smoking about 4 years ago. His smoking use included cigarettes. He has a  10.25 pack-year smoking history. He has never used smokeless tobacco. He reports current alcohol use of about 3.0 standard drinks of alcohol per week. He reports that he does not use drugs. No Known Allergies Prior to Admission medications   Medication Sig Start Date End Date Taking? Authorizing Provider  acetaminophen (TYLENOL) 500 MG tablet Take 1,000 mg by mouth every 6 (six) hours as needed for moderate pain or headache.   Yes [provider]  albuterol (VENTOLIN HFA) 108 (90 Base) MCG/ACT inhaler Inhale 2 puffs into the lungs every 6 (six) hours as needed for wheezing or shortness of breath. 05/09/20  Yes Freddi Starr, MD  allopurinol (ZYLOPRIM) 100 MG tablet Take 1 tablet (100 mg total) by mouth daily. Patient taking differently: Take 100 mg by mouth 2 (two) times daily. 05/10/20  Yes Dorena Dew, FNP  carvedilol (COREG) 12.5 MG tablet TAKE 1 TABLET BY MOUTH 2 (TWO) TIMES DAILY WITH A MEAL. Patient taking differently: Take 12.5 mg by mouth 2 (two) times daily with a meal. 01/13/20  Yes Bensimhon, Shaune Pascal, MD  diphenhydramine-acetaminophen (TYLENOL PM) 25-500 MG TABS tablet Take 2 tablets by mouth at bedtime as needed (sleep).   Yes [provider]  ELIQUIS 5 MG TABS tablet TAKE 1 TABLET BY MOUTH TWICE A DAY Patient taking differently: Take 5 mg by mouth 2 (two) times daily. 01/25/20  Yes Bensimhon, Shaune Pascal, MD  ferric citrate (AURYXIA) 1 GM 210 MG(Fe) tablet Take 210 mg by mouth See admin instructions. Take 210 mg with each meal and snack   Yes [provider]  folic acid (FOLVITE) 1 MG tablet TAKE 1 TABLET BY MOUTH EVERY DAY Patient taking differently: Take 1 mg by mouth daily. TAKE 1 TABLET BY MOUTH EVERY DAY 05/11/20  Yes Vevelyn Francois, NP  gabapentin (NEURONTIN) 100 MG capsule Take 1 capsule (100 mg total) by mouth at bedtime. Patient taking differently: Take 100 mg by mouth at bedtime as needed (pain). 05/10/20  Yes Dorena Dew, FNP   hydrALAZINE (APRESOLINE) 25 MG tablet Take 1 tablet (25 mg total) by mouth in the morning and at bedtime. DO NOT TAKE AM DOSES ON DIALYSIS DAYS Patient taking differently: Take 25 mg by mouth See admin instructions. Take 1 tablet by mouth twice daily all days, EXCEPT on dialysis day (T, Th, Sat) take in the evening only 01/27/20 05/23/20 Yes Bensimhon, Shaune Pascal, MD  hydroxyurea (HYDREA) 500 MG capsule TAKE 1 CAPSULE BY MOUTH EVERY DAY WITH FOOD TO MINIMIZE GI SIDE EFFECTS Patient taking differently: Take 500 mg by mouth daily. TAKE 1 CAPSULE BY MOUTH EVERY DAY WITH FOOD TO MINIMIZE GI SIDE EFFECTS 02/05/20  Yes Dorena Dew, FNP  isosorbide mononitrate (IMDUR) 30 MG 24 hr tablet Take 1 tablet (30 mg total) by mouth daily. DO NOT TAKE AM ON DIALYSIS DAYS. Patient taking differently: Take 30 mg by mouth daily. 01/27/20  Yes Bensimhon, Shaune Pascal, MD  lidocaine-prilocaine (EMLA) cream Apply 1 application topically daily as needed (port access).  09/08/19  Yes [provider]  metolazone (ZAROXOLYN) 5 MG tablet Take 1 tablet (5 mg  total) by mouth daily as needed. Patient taking differently: Take 5 mg by mouth daily as needed (fluid). 05/23/20  Yes Bensimhon, Shaune Pascal, MD  Oxycodone HCl 10 MG TABS Take 1 tablet (10 mg total) by mouth every 4 (four) hours as needed (pain). 07/25/20  Yes Dorena Dew, FNP  tiZANidine (ZANAFLEX) 4 MG tablet Take 0.5 tablets (2 mg total) by mouth every 8 (eight) hours as needed for muscle spasms. 06/08/20  Yes Dorena Dew, FNP  torsemide (DEMADEX) 20 MG tablet Take 80 mg by mouth daily. 03/31/20  Yes [provider]   Current Facility-Administered Medications  Medication Dose Route Frequency Provider Last Rate Last Admin  . acetaminophen (TYLENOL) tablet 650 mg  650 mg Oral Q6H PRN Shalhoub, Sherryll Burger, MD       Or  . acetaminophen (TYLENOL) suppository 650 mg  650 mg Rectal Q6H PRN Shalhoub, Sherryll Burger, MD      . allopurinol (ZYLOPRIM) tablet 100 mg   100 mg Oral BID Vernelle Emerald, MD   100 mg at 08/02/20 1015  . carvedilol (COREG) tablet 12.5 mg  12.5 mg Oral BID WC Shalhoub, Sherryll Burger, MD   12.5 mg at 08/02/20 0852  . [START ON 08/03/2020] Chlorhexidine Gluconate Cloth 2 % PADS 6 each  6 each Topical Q0600 Evelynne Spiers, PA      . fentaNYL (SUBLIMAZE) injection 75 mcg  75 mcg Intravenous Q2H PRN Dorena Dew, FNP   75 mcg at 08/02/20 1327  . fentaNYL 50 mcg/mL PCA injection   Intravenous Q4H Shalhoub, Sherryll Burger, MD      . folic acid (FOLVITE) tablet 1 mg  1 mg Oral Daily Shalhoub, Sherryll Burger, MD   1 mg at 08/02/20 1015  . isosorbide mononitrate (IMDUR) 24 hr tablet 30 mg  30 mg Oral Daily Shalhoub, Sherryll Burger, MD      . naloxone [facility]) injection 0.4 mg  0.4 mg Intravenous PRN Shalhoub, Sherryll Burger, MD       And  . sodium chloride flush (NS) 0.9 % injection 9 mL  9 mL Intravenous PRN Shalhoub, Sherryll Burger, MD      . pantoprazole (PROTONIX) injection 40 mg  40 mg Intravenous Q12H Dorena Dew, FNP   40 mg at 08/02/20 1226  . polyethylene glycol (MIRALAX / GLYCOLAX) packet 17 g  17 g Oral Daily PRN Vernelle Emerald, MD       Labs: Basic Metabolic Panel: Recent Labs  Lab 07/29/20 0646 08/01/20 2047 08/02/20 0853  NA 135 131* 133*  K 4.4 4.6 4.9  CL 93* 91* 95*  CO2  --  23 22  GLUCOSE 94 85 89  BUN 45* 89* 98*  CREATININE 4.80* 6.79* 7.09*  CALCIUM  --  8.4* 8.3*   Liver Function Tests: Recent Labs  Lab 08/01/20 2047 08/02/20 0853  AST 29 24  ALT 21 17  ALKPHOS 105 92  BILITOT 2.9* 2.6*  PROT 7.4 6.8  ALBUMIN 3.5 3.3*   CBC: Recent Labs  Lab 07/29/20 0646 08/01/20 2047  WBC  --  4.9  NEUTROABS  --  3.1  HGB 8.5* 6.2*  HCT 25.0* 17.2*  MCV  --  115.4*  PLT  --  99*   Studies/Results: DG Chest Port 1 View  Result Date: 08/01/2020 CLINICAL DATA:  Shortness of breath EXAM: PORTABLE CHEST 1 VIEW COMPARISON:  09/11/2019, CT chest 05/16/2020, 03/24/2019 FINDINGS: Mild cardiomegaly with aortic  atherosclerosis. No focal opacity or pleural  effusion. No pneumothorax. IMPRESSION: Mild cardiomegaly. Electronically Signed   By: Donavan Foil M.D.   On: 08/01/2020 23:01    ROS: All others negative except those listed in HPI.  Physical Exam: Vitals:   08/02/20 1215 08/02/20 1300 08/02/20 1349 08/02/20 1500  BP: 120/74 129/86 136/78 125/84  Pulse: 80 88 78   Resp:   14   Temp:      TempSrc:      SpO2: 94% 96% 100%   Weight:      Height:         General: WDWN male in NAD Head: NCAT sclera not icteric MMM Neck: Supple. +JVD Lungs: CTA bilaterally. No wheeze, rales or rhonchi. Breathing is unlabored. Heart: regular rate, irregular rhythm. No murmur, rubs or gallops.  Abdomen: soft, mildly tender, +BS, no guarding, no rebound tenderness Lower extremities:no edema, ischemic changes, or open wounds  Neuro: Alert. Moves all extremities spontaneously. Psych:  Responds to questions appropriately with a normal affect. Dialysis Access: RU AVF+b  Dialysis Orders:  TTS - AF  4hrs, BFR 400, DFR 500 (d/t dialsyate shortage),  EDW 66kg, 2K/ 2.25Ca, P2  Access: RU AVF  Heparin 2400 Mircera 225 mcg q2wks - last 07/19/20 Venofer '50mg'$  qwk Hectorol 6 mcg IV qHD   Sensipar '30mg'$  qHD  Assessment/Plan: 1.  Sickle cell crisis - pain control per primary 2. Possible GIB - +hematemesis, +FOBT on Eliquis.  GI consulting.  EGD likely tomorrow.  Per GI 3.  ESRD -  On HD TTS.  Orders written for HD tonight per regular schedule. K 4.9.  4.  Hypertension/volume  - BP mostly in goal. Continue home meds.  Typically pulls off 4-5L with dialysis.  CXR clear.  Does not appear grossly volume overloaded.  Plan for UF as tolerated.  5.  Anemia of CKD - Hgb drop to 6.3.  Due for ESA, will write with HD today. 1 unit pRBC ordered at [facility].  6.  Secondary Hyperparathyroidism -  Ca in goal.  Will check phos. Continue sensipar and hectorol.  7.  Nutrition - Renal diet w/fluid restrictions.  8. A fib 9. Chronic systolic  CHF, pulmonary HTN   Jen Mow, PA-C [employer] 08/02/2020, 4:21 PM

## 2020-08-02 NOTE — Progress Notes (Signed)
Patient transported to dialysis

## 2020-08-02 NOTE — Progress Notes (Signed)
Patrick [name] is a 65 year old male with a medical history significant for sickle cell anemia type SS, end-stage renal disease (Tuesday, Thursday, and Saturday his current schedule), atrial fibrillation on Eliquis, history of GI bleeding in the past with several gastric ulcers seen on EGD [id number], systolic congestive heart failure (echo 02/2020 EF 35-40%), gout, and hypertension who was admitted for possible upper GI bleed in the setting of sickle cell pain crisis. Patient states that on yesterday afternoon he felt several bouts of nausea and continue on afternoon into the evening.  In late evening patient began to experience episodes of vomiting characterized as "dark and bloody".  He reports 4 episodes of large vomitus.  The following vomiting, patient began to experience excruciating pain to bilateral lower extremities.  He now rates his pain as 7/10, sharp and constant.  He says the pain has improved some in the emergency department.  Care plan: Patient is scheduled to transfer to [facility] for dialysis today to continue his dialysis schedule of Tuesday, Thursday, and Saturday during admission. Nephrology will be consulted later on this morning EDP consulted with gastroenterology concerning possible GI bleed.  Patient may warrant upper GI, will defer to gastroenterology. We will continue pain control with fentanyl PCA.  However, while patient is in the emergency department fentanyl 75 mcg every 2 hours as needed for severe pain. Patient will remain n.p.o. with sips and ice chips pending gastroenterology consult. Hold hydroxyurea  Continue to follow closely   [name]  APRN, MSN, FNP-C [facility] [address] [place], [place] [address] [phone]

## 2020-08-02 NOTE — ED Notes (Signed)
Pt requesting benadryl d/t itching, MD made aware

## 2020-08-02 NOTE — Plan of Care (Signed)
  Problem: Education: Goal: Knowledge of General Education information will improve Description Including pain rating scale, medication(s)/side effects and non-pharmacologic comfort measures Outcome: Progressing   Problem: Health Behavior/Discharge Planning: Goal: Ability to manage health-related needs will improve Outcome: Progressing   Problem: Clinical Measurements: Goal: Respiratory complications will improve Outcome: Progressing   Problem: Pain Managment: Goal: General experience of comfort will improve Outcome: Progressing   

## 2020-08-02 NOTE — Progress Notes (Signed)
Brief Pharmacy Note re: Hydrea  Hydroxyurea (Hydrea) hold criteria:  ANC < 2  Pltc < 80K in sickle-cell patients; < 100K in other patients  Hgb <= 6 in sickle-cell patients; < 8 in other patients  Reticulocytes < 80K when Hgb < 9  Hydrea d/c'd.  Resume as clinically appropriate once CBC improved.  Netta Cedars, PharmD, BCPS 08/02/2020'@6'$ :36 AM

## 2020-08-02 NOTE — ED Provider Notes (Incomplete)
[facility] DEPT Provider Note   CSN: [id number] Arrival date & time: 08/01/20  1955     History Chief Complaint  Patient presents with  . Sickle Cell Pain Crisis    Patrick [name] is a 65 y.o. male.  Patrick [name] is a 65 y.o. male         Past Medical History:  Diagnosis Date  . Blood dyscrasia   . Blood transfusion    "I've had a bunch of them"  . CHF (congestive heart failure) ([place])    followed by hf clinic  . Chronic kidney disease (CKD), stage III (moderate) (HCC)   . Dialysis patient ([place]) 04/2019   AV fistula (right arm)  . Gout   . [id number])    "probably weekly" (01/13/2014)  . Legally blind   . Shortness of breath dyspnea   . Sickle cell disease, type SS ([place])   . Swelling abdomen   . Swelling of extremity, left   . Swelling of extremity, right     Patient Active Problem List   Diagnosis Date Noted  . Acute upper GI bleed 08/02/2020  . Prolonged QT interval 08/02/2020  . Tobacco dependence 05/28/2020  . Muscle spasm of back 01/29/2020  . Chronic systolic CHF (congestive heart failure) ([place]) 09/11/2019  . Hypoxia 06/02/2018  . Symptomatic anemia   . Chronic heart failure with preserved ejection fraction ([place])   . Colon cancer screening 07/16/2017  . Encounter for therapeutic drug monitoring 12/29/2015  . 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  . Permanent atrial fibrillation ([place]) 12/24/2015  . Cough with sputum 10/25/2015  . Muscle tightness-Right arm  05/27/2014  . Arm pain, right 05/06/2014  . End stage renal disease ([place]) 05/06/2014  . Chest pain 01/13/2014  . Vitamin D deficiency 09/25/2013  . CKD (chronic kidney disease), stage IV ([place]) 09/25/2013  . Elevated uric acid in blood 09/25/2013  . Hb-SS disease without crisis ([place]) 09/25/2013  . Anemia 09/25/2013  . Onychomycosis of toenail 08/27/2013  . Cellulitis 08/27/2013  . Hyperglycemia 08/27/2013  . CHF (congestive heart failure) ([place])  08/19/2013  . Sickle cell anemia ([place]) 11/21/2012  . NSVT (nonsustained ventricular tachycardia) ([place]) 10/21/2011  . Gout 10/19/2011  . Sickle cell crisis ([place]) 10/17/2011  . Acute on chronic systolic and diastolic heart failure, NYHA class 4 ([place]) 10/17/2011  . Essential hypertension 06/07/2011    Past Surgical History:  Procedure Laterality Date  . A/V FISTULAGRAM Right 07/29/2020   Procedure: A/V FISTULAGRAM - Right Arm;  Surgeon: Angelia Mould, MD;  Location: [facility];  Service: Cardiovascular;  Laterality: Right;  . BASCILIC VEIN TRANSPOSITION Right 04/12/2014   Procedure: BASILIC VEIN TRANSPOSITION;  Surgeon: Elam Dutch, MD;  Location: [facility];  Service: Vascular;  Laterality: Right;  . CARDIAC CATHETERIZATION  05/2011  . CARDIOVERSION N/A 06/05/2018   Procedure: CARDIOVERSION;  Surgeon: Jolaine Artist, MD;  Location: [facility] ENDOSCOPY;  Service: Cardiovascular;  Laterality: N/A;  . CARDIOVERSION N/A 10/06/2019   Procedure: CARDIOVERSION;  Surgeon: Jolaine Artist, MD;  Location: [facility];  Service: Cardiovascular;  Laterality: N/A;  . COLONOSCOPY WITH PROPOFOL N/A 09/24/2017   Procedure: COLONOSCOPY WITH PROPOFOL;  Surgeon: Jackquline Denmark, MD;  Location: [facility] ENDOSCOPY;  Service: Endoscopy;  Laterality: N/A;  . ESOPHAGOGASTRODUODENOSCOPY N/A 09/19/2017   Procedure: ESOPHAGOGASTRODUODENOSCOPY (EGD);  Surgeon: Jackquline Denmark, MD;  Location: [facility] ENDOSCOPY;  Service: Endoscopy;  Laterality: N/A;  . LEFT AND RIGHT HEART CATHETERIZATION WITH CORONARY ANGIOGRAM N/A 06/11/2011  Procedure: LEFT AND RIGHT HEART CATHETERIZATION WITH CORONARY ANGIOGRAM;  Surgeon: Larey Dresser, MD;  Location: [facility] CATH LAB;  Service: Cardiovascular;  Laterality: N/A;  . PERIPHERAL VASCULAR BALLOON ANGIOPLASTY Right 07/29/2020   Procedure: PERIPHERAL VASCULAR BALLOON ANGIOPLASTY;  Surgeon: Angelia Mould, MD;  Location: [facility];  Service: Cardiovascular;  Laterality: Right;   arm fistula  . TEE WITHOUT CARDIOVERSION N/A 12/27/2015   Procedure: TRANSESOPHAGEAL ECHOCARDIOGRAM (TEE);  Surgeon: Sueanne Margarita, MD;  Location: [facility] ENDOSCOPY;  Service: Cardiovascular;  Laterality: N/A;       Family History  Problem Relation Age of Onset  . Alcohol abuse Mother   . Liver disease Mother   . Cirrhosis Mother   . Heart attack Mother     Social History   Tobacco Use  . Smoking status: Former Smoker    Packs/day: 0.25    Years: 41.00    Pack years: 10.25    Types: Cigarettes    Quit date: 12/08/2015    Years since quitting: 4.6  . Smokeless tobacco: Never Used  Vaping Use  . Vaping Use: Never used  Substance Use Topics  . Alcohol use: Yes    Alcohol/week: 3.0 standard drinks    Types: 1 Glasses of wine, 2 Cans of beer per week    Comment: occasional  . Drug use: No    Home Medications Prior to Admission medications   Medication Sig Start Date End Date Taking? Authorizing Provider  acetaminophen (TYLENOL) 500 MG tablet Take 1,000 mg by mouth every 6 (six) hours as needed for moderate pain or headache.   Yes [provider]  albuterol (VENTOLIN HFA) 108 (90 Base) MCG/ACT inhaler Inhale 2 puffs into the lungs every 6 (six) hours as needed for wheezing or shortness of breath. 05/09/20  Yes Freddi Starr, MD  allopurinol (ZYLOPRIM) 100 MG tablet Take 1 tablet (100 mg total) by mouth daily. Patient taking differently: Take 100 mg by mouth 2 (two) times daily. 05/10/20  Yes Dorena Dew, FNP  carvedilol (COREG) 12.5 MG tablet TAKE 1 TABLET BY MOUTH 2 (TWO) TIMES DAILY WITH A MEAL. Patient taking differently: Take 12.5 mg by mouth 2 (two) times daily with a meal. 01/13/20  Yes Bensimhon, Shaune Pascal, MD  diphenhydramine-acetaminophen (TYLENOL PM) 25-500 MG TABS tablet Take 2 tablets by mouth at bedtime as needed (sleep).   Yes [provider]  ELIQUIS 5 MG TABS tablet TAKE 1 TABLET BY MOUTH TWICE A DAY Patient taking differently: Take 5 mg  by mouth 2 (two) times daily. 01/25/20  Yes Bensimhon, Shaune Pascal, MD  ferric citrate (AURYXIA) 1 GM 210 MG(Fe) tablet Take 210 mg by mouth See admin instructions. Take 210 mg with each meal and snack   Yes [provider]  folic acid (FOLVITE) 1 MG tablet TAKE 1 TABLET BY MOUTH EVERY DAY Patient taking differently: Take 1 mg by mouth daily. TAKE 1 TABLET BY MOUTH EVERY DAY 05/11/20  Yes Vevelyn Francois, NP  gabapentin (NEURONTIN) 100 MG capsule Take 1 capsule (100 mg total) by mouth at bedtime. Patient taking differently: Take 100 mg by mouth at bedtime as needed (pain). 05/10/20  Yes Dorena Dew, FNP  hydrALAZINE (APRESOLINE) 25 MG tablet Take 1 tablet (25 mg total) by mouth in the morning and at bedtime. DO NOT TAKE AM DOSES ON DIALYSIS DAYS 01/27/20 05/23/20 Yes Bensimhon, Shaune Pascal, MD  hydroxyurea (HYDREA) 500 MG capsule TAKE 1 CAPSULE BY MOUTH EVERY DAY WITH  FOOD TO MINIMIZE GI SIDE EFFECTS Patient taking differently: Take 500 mg by mouth daily. TAKE 1 CAPSULE BY MOUTH EVERY DAY WITH FOOD TO MINIMIZE GI SIDE EFFECTS 02/05/20  Yes Dorena Dew, FNP  isosorbide mononitrate (IMDUR) 30 MG 24 hr tablet Take 1 tablet (30 mg total) by mouth daily. DO NOT TAKE AM ON DIALYSIS DAYS. 01/27/20  Yes Bensimhon, Shaune Pascal, MD  lidocaine-prilocaine (EMLA) cream Apply 1 application topically daily as needed (port access).  09/08/19  Yes [provider]  metolazone (ZAROXOLYN) 5 MG tablet Take 1 tablet (5 mg total) by mouth daily as needed. Patient taking differently: Take 5 mg by mouth daily as needed (fluid). 05/23/20  Yes Bensimhon, Shaune Pascal, MD  Oxycodone HCl 10 MG TABS Take 1 tablet (10 mg total) by mouth every 4 (four) hours as needed (pain). 07/25/20  Yes Dorena Dew, FNP  tiZANidine (ZANAFLEX) 4 MG tablet Take 0.5 tablets (2 mg total) by mouth every 8 (eight) hours as needed for muscle spasms. 06/08/20  Yes Dorena Dew, FNP  torsemide (DEMADEX) 20 MG tablet Take 80 mg by mouth  daily. 03/31/20  Yes [provider]    Allergies    Patient has no known allergies.  Review of Systems   Review of Systems  Physical Exam Updated Vital Signs BP 139/89 (BP Location: Right Arm)   Pulse 90   Temp 97.7 F (36.5 C) (Oral)   Resp 15   Ht '5\' 11"'$  (1.803 m)   Wt 69.4 kg   SpO2 97%   BMI 21.34 kg/m   Physical Exam  ED Results / Procedures / Treatments   Labs (all labs ordered are listed, but only abnormal results are displayed) Labs Reviewed  COMPREHENSIVE METABOLIC PANEL - Abnormal; Notable for the following components:      Result Value   Sodium 131 (*)    Chloride 91 (*)    BUN 89 (*)    Creatinine, Ser 6.79 (*)    Calcium 8.4 (*)    Total Bilirubin 2.9 (*)    GFR, Estimated 9 (*)    Anion gap 17 (*)    All other components within normal limits  CBC WITH DIFFERENTIAL/PLATELET - Abnormal; Notable for the following components:   RBC 1.49 (*)    Hemoglobin 6.2 (*)    HCT 17.2 (*)    MCV 115.4 (*)    MCH 41.6 (*)    Platelets 99 (*)    nRBC 4.7 (*)    All other components within normal limits  RETICULOCYTES - Abnormal; Notable for the following components:   RBC. 1.42 (*)    Immature Retic Fract 33.4 (*)    All other components within normal limits  BRAIN NATRIURETIC PEPTIDE - Abnormal; Notable for the following components:   B Natriuretic Peptide 2,730.5 (*)    All other components within normal limits  POC OCCULT BLOOD, ED - Abnormal; Notable for the following components:   Fecal Occult Bld POSITIVE (*)    All other components within normal limits  CBC  CBC  CBC  APTT  PROTIME-INR  COMPREHENSIVE METABOLIC PANEL  MAGNESIUM  TYPE AND SCREEN  PREPARE RBC (CROSSMATCH)    EKG None  Radiology DG Chest Port 1 View  Result Date: 08/01/2020 CLINICAL DATA:  Shortness of breath EXAM: PORTABLE CHEST 1 VIEW COMPARISON:  09/11/2019, CT chest 05/16/2020, 03/24/2019 FINDINGS: Mild cardiomegaly with aortic atherosclerosis. No focal opacity  or pleural effusion. No pneumothorax. IMPRESSION: Mild cardiomegaly. Electronically  Signed   By: Donavan Foil M.D.   On: 08/01/2020 23:01    Procedures .Critical Care Performed by: Jacqlyn Larsen, PA-C Authorized by: Jacqlyn Larsen, PA-C   Critical care provider statement:    Critical care time (minutes):  45   Critical care was necessary to treat or prevent imminent or life-threatening deterioration of the following conditions:  Circulatory failure (GI bleed requiring blood transfusion)   Critical care was time spent personally by me on the following activities:  Discussions with consultants, evaluation of patient's response to treatment, examination of patient, ordering and performing treatments and interventions, ordering and review of laboratory studies, ordering and review of radiographic studies, pulse oximetry, re-evaluation of patient's condition, obtaining history from patient or surrogate and review of old charts   {Remember to document critical care time when appropriate:1}  Medications Ordered in ED Medications  pantoprazole (PROTONIX) injection 40 mg (has no administration in time range)  naloxone [facility]) injection 0.4 mg (has no administration in time range)    And  sodium chloride flush (NS) 0.9 % injection 9 mL (has no administration in time range)  fentaNYL 50 mcg/mL PCA injection (0 mcg Intravenous Hold 08/02/20 0305)  allopurinol (ZYLOPRIM) tablet 100 mg (has no administration in time range)  carvedilol (COREG) tablet 12.5 mg (has no administration in time range)  isosorbide mononitrate (IMDUR) 24 hr tablet 30 mg (has no administration in time range)  folic acid (FOLVITE) tablet 1 mg (has no administration in time range)  acetaminophen (TYLENOL) tablet 650 mg (has no administration in time range)    Or  acetaminophen (TYLENOL) suppository 650 mg (has no administration in time range)  polyethylene glycol (MIRALAX / GLYCOLAX) packet 17 g (has no administration in time  range)  morphine 4 MG/ML injection 4 mg (4 mg Intravenous Given 08/01/20 2302)  morphine 4 MG/ML injection 6 mg (6 mg Intravenous Given 08/02/20 0011)  pantoprazole (PROTONIX) 80 mg in sodium chloride 0.9 % 100 mL IVPB (80 mg Intravenous New Bag/Given 08/02/20 0028)  0.9 %  sodium chloride infusion (10 mL/hr Intravenous New Bag/Given 08/02/20 0031)  fentaNYL (SUBLIMAZE) injection 75 mcg (75 mcg Intravenous Given 08/02/20 0218)  fentaNYL (SUBLIMAZE) injection 75 mcg (75 mcg Intravenous Given 08/02/20 [id number])    ED Course  I have reviewed the triage vital signs and the nursing notes.  Pertinent labs & imaging results that were available during my care of the patient were reviewed by me and considered in my medical decision making (see chart for details).    MDM Rules/Calculators/A&P                          *** Final Clinical Impression(s) / ED Diagnoses Final diagnoses:  Sickle cell crisis ([place])  Upper GI bleed  Symptomatic anemia    Rx / DC Orders ED Discharge Orders    None

## 2020-08-02 NOTE — H&P (Signed)
History and Physical    Patrick [name] [id number] DOB: [birth year] DOA: 08/01/2020  PCP: Dorena Dew, FNP  Patient coming from: Home   Chief Complaint:  Chief Complaint  Patient presents with  . Sickle Cell Pain Crisis     HPI:    65 year old male with past medical history of sickle cell anemia, end-stage renal disease (Tues, Thurs Sat HD schedule), atrial fibrillation (on eliquis), history of GI bleeding in the past (several gastric ulcers seen via EGD [id number]), systolic congestive heart failure (Echo 02/2020 EF 35-40%), gout, hypertension who presents to [facility] emergency department with complaints of bilateral lower extremity pain and hematemesis.  Patient explains that on the afternoon of 2/14 the patient had arrived from work and felt nauseated.  Patient continued to feel nauseated throughout the afternoon and into the evening.  At approximately 6 PM patient began to experience episodes of vomiting.  Patient describes his vomitus as "dark and bloody."  Patient states that he experienced at least 4 episodes of large-volume vomitus, all appearing to be dark and bloody.  Shortly after the patient's 4 episodes of vomiting patient began to experience severe pain of his bilateral thighs and legs.  Patient describes his pain as 10 out of 10 in intensity, sharp in quality and consistent with previous sickle cell crises in the past.    Upon further questioning patient denies abdominal pain, melena, fever, dysuria, alcohol use.  Of associated generalized weakness, lightheadedness and malaise.    With this constellation of symptoms the patient presented to [facility] emergency department for evaluation.    Review of Systems:   Review of Systems  Constitutional: Positive for malaise/fatigue.  Gastrointestinal: Positive for vomiting.  Musculoskeletal: Positive for myalgias.  Neurological: Positive for weakness.  All other systems reviewed and are  negative.   Past Medical History:  Diagnosis Date  . Blood dyscrasia   . Blood transfusion    "I've had a bunch of them"  . CHF (congestive heart failure) ([place])    followed by hf clinic  . Chronic kidney disease (CKD), stage III (moderate) (HCC)   . Dialysis patient ([place]) 04/2019   AV fistula (right arm)  . Gout   . [id number])    "probably weekly" (01/13/2014)  . Legally blind   . Shortness of breath dyspnea   . Sickle cell disease, type SS ([place])   . Swelling abdomen   . Swelling of extremity, left   . Swelling of extremity, right     Past Surgical History:  Procedure Laterality Date  . A/V FISTULAGRAM Right 07/29/2020   Procedure: A/V FISTULAGRAM - Right Arm;  Surgeon: Angelia Mould, MD;  Location: [place] CV LAB;  Service: Cardiovascular;  Laterality: Right;  . BASCILIC VEIN TRANSPOSITION Right 04/12/2014   Procedure: BASILIC VEIN TRANSPOSITION;  Surgeon: Elam Dutch, MD;  Location: [place];  Service: Vascular;  Laterality: Right;  . CARDIAC CATHETERIZATION  05/2011  . CARDIOVERSION N/A 06/05/2018   Procedure: CARDIOVERSION;  Surgeon: Jolaine Artist, MD;  Location: [facility] ENDOSCOPY;  Service: Cardiovascular;  Laterality: N/A;  . CARDIOVERSION N/A 10/06/2019   Procedure: CARDIOVERSION;  Surgeon: Jolaine Artist, MD;  Location: [place];  Service: Cardiovascular;  Laterality: N/A;  . COLONOSCOPY WITH PROPOFOL N/A 09/24/2017   Procedure: COLONOSCOPY WITH PROPOFOL;  Surgeon: Jackquline Denmark, MD;  Location: [facility] ENDOSCOPY;  Service: Endoscopy;  Laterality: N/A;  . ESOPHAGOGASTRODUODENOSCOPY N/A 09/19/2017   Procedure: ESOPHAGOGASTRODUODENOSCOPY (EGD);  Surgeon: Jackquline Denmark, MD;  Location: [facility] ENDOSCOPY;  Service: Endoscopy;  Laterality: N/A;  . LEFT AND RIGHT HEART CATHETERIZATION WITH CORONARY ANGIOGRAM N/A 06/11/2011   Procedure: LEFT AND RIGHT HEART CATHETERIZATION WITH CORONARY ANGIOGRAM;  Surgeon: Larey Dresser, MD;  Location: [facility] CATH LAB;  Service:  Cardiovascular;  Laterality: N/A;  . PERIPHERAL VASCULAR BALLOON ANGIOPLASTY Right 07/29/2020   Procedure: PERIPHERAL VASCULAR BALLOON ANGIOPLASTY;  Surgeon: Angelia Mould, MD;  Location: [place] CV LAB;  Service: Cardiovascular;  Laterality: Right;  arm fistula  . TEE WITHOUT CARDIOVERSION N/A 12/27/2015   Procedure: TRANSESOPHAGEAL ECHOCARDIOGRAM (TEE);  Surgeon: Sueanne Margarita, MD;  Location: [facility] ENDOSCOPY;  Service: Cardiovascular;  Laterality: N/A;     reports that he quit smoking about 4 years ago. His smoking use included cigarettes. He has a 10.25 pack-year smoking history. He has never used smokeless tobacco. He reports current alcohol use of about 3.0 standard drinks of alcohol per week. He reports that he does not use drugs.  No Known Allergies  Family History  Problem Relation Age of Onset  . Alcohol abuse Mother   . Liver disease Mother   . Cirrhosis Mother   . Heart attack Mother      Prior to Admission medications   Medication Sig Start Date End Date Taking? Authorizing Provider  acetaminophen (TYLENOL) 500 MG tablet Take 1,000 mg by mouth every 6 (six) hours as needed for moderate pain or headache.   Yes [provider]  albuterol (VENTOLIN HFA) 108 (90 Base) MCG/ACT inhaler Inhale 2 puffs into the lungs every 6 (six) hours as needed for wheezing or shortness of breath. 05/09/20  Yes Freddi Starr, MD  allopurinol (ZYLOPRIM) 100 MG tablet Take 1 tablet (100 mg total) by mouth daily. Patient taking differently: Take 100 mg by mouth 2 (two) times daily. 05/10/20  Yes Dorena Dew, FNP  carvedilol (COREG) 12.5 MG tablet TAKE 1 TABLET BY MOUTH 2 (TWO) TIMES DAILY WITH A MEAL. Patient taking differently: Take 12.5 mg by mouth 2 (two) times daily with a meal. 01/13/20  Yes Bensimhon, Shaune Pascal, MD  diphenhydramine-acetaminophen (TYLENOL PM) 25-500 MG TABS tablet Take 2 tablets by mouth at bedtime as needed (sleep).   Yes [provider]   ELIQUIS 5 MG TABS tablet TAKE 1 TABLET BY MOUTH TWICE A DAY Patient taking differently: Take 5 mg by mouth 2 (two) times daily. 01/25/20  Yes Bensimhon, Shaune Pascal, MD  ferric citrate (AURYXIA) 1 GM 210 MG(Fe) tablet Take 210 mg by mouth See admin instructions. Take 210 mg with each meal and snack   Yes [provider]  folic acid (FOLVITE) 1 MG tablet TAKE 1 TABLET BY MOUTH EVERY DAY Patient taking differently: Take 1 mg by mouth daily. TAKE 1 TABLET BY MOUTH EVERY DAY 05/11/20  Yes Vevelyn Francois, NP  gabapentin (NEURONTIN) 100 MG capsule Take 1 capsule (100 mg total) by mouth at bedtime. Patient taking differently: Take 100 mg by mouth at bedtime as needed (pain). 05/10/20  Yes Dorena Dew, FNP  hydrALAZINE (APRESOLINE) 25 MG tablet Take 1 tablet (25 mg total) by mouth in the morning and at bedtime. DO NOT TAKE AM DOSES ON DIALYSIS DAYS 01/27/20 05/23/20 Yes Bensimhon, Shaune Pascal, MD  hydroxyurea (HYDREA) 500 MG capsule TAKE 1 CAPSULE BY MOUTH EVERY DAY WITH FOOD TO MINIMIZE GI SIDE EFFECTS Patient taking differently: Take 500 mg by mouth daily. TAKE 1 CAPSULE BY MOUTH EVERY DAY WITH FOOD TO MINIMIZE GI SIDE EFFECTS 02/05/20  Yes Dorena Dew, FNP  isosorbide mononitrate (IMDUR) 30 MG 24 hr tablet Take 1 tablet (30 mg total) by mouth daily. DO NOT TAKE AM ON DIALYSIS DAYS. 01/27/20  Yes Bensimhon, Shaune Pascal, MD  lidocaine-prilocaine (EMLA) cream Apply 1 application topically daily as needed (port access).  09/08/19  Yes [provider]  metolazone (ZAROXOLYN) 5 MG tablet Take 1 tablet (5 mg total) by mouth daily as needed. Patient taking differently: Take 5 mg by mouth daily as needed (fluid). 05/23/20  Yes Bensimhon, Shaune Pascal, MD  Oxycodone HCl 10 MG TABS Take 1 tablet (10 mg total) by mouth every 4 (four) hours as needed (pain). 07/25/20  Yes Dorena Dew, FNP  tiZANidine (ZANAFLEX) 4 MG tablet Take 0.5 tablets (2 mg total) by mouth every 8 (eight) hours as needed for muscle  spasms. 06/08/20  Yes Dorena Dew, FNP  torsemide (DEMADEX) 20 MG tablet Take 80 mg by mouth daily. 03/31/20  Yes [provider]    Physical Exam: Vitals:   08/02/20 0315 08/02/20 0416 08/02/20 0515 08/02/20 0539  BP: 121/65 (!) 136/94 139/76 (!) 149/71  Pulse: 87 85 83 87  Resp: 18 18 (!) 22 (!) 23  Temp:    98 F (36.7 C)  TempSrc:    Oral  SpO2: 93% 95% 94%   Weight:      Height:        Constitutional: Acute alert and oriented x3, patient is in distress due to pain.   Skin: no rashes, no lesions, good skin turgor noted. Eyes: Pupils are equally reactive to light.  No evidence of scleral icterus or conjunctival pallor.  ENMT: Moist mucous membranes noted.  Posterior pharynx clear of any exudate or lesions.   Neck: normal, supple, no masses, no thyromegaly.  No evidence of jugular venous distension.   Respiratory: clear to auscultation bilaterally, no wheezing, no crackles. Normal respiratory effort. No accessory muscle use.  Cardiovascular: Hyperdynamic PMI.  Regular rate and rhythm, no murmurs / rubs / gallops. No extremity edema. 2+ pedal pulses. No carotid bruits.  Chest:   Nontender without crepitus or deformity.   Back:   Nontender without crepitus or deformity. Abdomen: Significant epigastric tenderness noted.  Abdomen soft however.   No evidence of intra-abdominal masses.  Positive bowel sounds noted in all quadrants.   Musculoskeletal: Notable right upper extremity fistula.  No joint deformity upper and lower extremities. Good ROM, no contractures. Normal muscle tone.  Neurologic: CN 2-12 grossly intact. Sensation intact.  Patient moving all 4 extremities spontaneously.  Patient is following all commands.  Patient is responsive to verbal stimuli.   Psychiatric: Patient exhibits anxious mood with appropriate affect.  Patient seems to possess insight as to their current situation.     Labs on Admission: I have personally reviewed following labs and imaging  studies -   CBC: Recent Labs  Lab 07/29/20 0646 08/01/20 2047  WBC  --  4.9  NEUTROABS  --  3.1  HGB 8.5* 6.2*  HCT 25.0* 17.2*  MCV  --  115.4*  PLT  --  99*   Basic Metabolic Panel: Recent Labs  Lab 07/29/20 0646 08/01/20 2047  NA 135 131*  K 4.4 4.6  CL 93* 91*  CO2  --  23  GLUCOSE 94 85  BUN 45* 89*  CREATININE 4.80* 6.79*  CALCIUM  --  8.4*   GFR: Estimated Creatinine Clearance: 11.1 mL/min (A) (by C-G formula based on SCr of 6.79 mg/dL (  H)). Liver Function Tests: Recent Labs  Lab 08/01/20 2047  AST 29  ALT 21  ALKPHOS 105  BILITOT 2.9*  PROT 7.4  ALBUMIN 3.5   No results for input(s): LIPASE, AMYLASE in the last 168 hours. No results for input(s): AMMONIA in the last 168 hours. Coagulation Profile: No results for input(s): INR, PROTIME in the last 168 hours. Cardiac Enzymes: No results for input(s): CKTOTAL, CKMB, CKMBINDEX, TROPONINI in the last 168 hours. BNP (last 3 results) No results for input(s): PROBNP in the last 8760 hours. HbA1C: No results for input(s): HGBA1C in the last 72 hours. CBG: No results for input(s): GLUCAP in the last 168 hours. Lipid Profile: No results for input(s): CHOL, HDL, LDLCALC, TRIG, CHOLHDL, LDLDIRECT in the last 72 hours. Thyroid Function Tests: No results for input(s): TSH, T4TOTAL, FREET4, T3FREE, THYROIDAB in the last 72 hours. Anemia Panel: Recent Labs    08/01/20 2047  RETICCTPCT 2.6   Urine analysis:    Component Value Date/Time   COLORURINE STRAW (A) 06/02/2018 1900   APPEARANCEUR CLEAR 06/02/2018 1900   APPEARANCEUR Clear 05/31/2017 0902   LABSPEC 1.006 06/02/2018 1900   PHURINE 7.0 06/02/2018 1900   GLUCOSEU NEGATIVE 06/02/2018 1900   HGBUR SMALL (A) 06/02/2018 1900   BILIRUBINUR neg 01/26/2020 1124   BILIRUBINUR Negative 05/31/2017 0902   KETONESUR negative 09/08/2018 1621   KETONESUR NEGATIVE 06/02/2018 1900   PROTEINUR Positive (A) 01/26/2020 1124   PROTEINUR 30 (A) 06/02/2018 1900    UROBILINOGEN 1.0 01/26/2020 1124   UROBILINOGEN 0.2 09/14/2013 1354   NITRITE neg 01/26/2020 1124   NITRITE NEGATIVE 06/02/2018 1900   LEUKOCYTESUR Negative 01/26/2020 1124   LEUKOCYTESUR Negative 05/31/2017 0902    Radiological Exams on Admission - Personally Reviewed: DG Chest Port 1 View  Result Date: 08/01/2020 CLINICAL DATA:  Shortness of breath EXAM: PORTABLE CHEST 1 VIEW COMPARISON:  09/11/2019, CT chest 05/16/2020, 03/24/2019 FINDINGS: Mild cardiomegaly with aortic atherosclerosis. No focal opacity or pleural effusion. No pneumothorax. IMPRESSION: Mild cardiomegaly. Electronically Signed   By: Donavan Foil M.D.   On: 08/01/2020 23:01    EKG: Personally reviewed.  Rhythm is atrial fibrillation with heart rate of 86 bpm.  Notably prolonged QTC of 510 ms. no dynamic ST segment changes appreciated.  Assessment/Plan Principal Problem:   Acute upper GI bleed   Patient experiencing several bouts of hematemesis concerning for upper gastrointestinal bleeding  Holding home regimen of Eliquis  80 mg of intravenous Protonix followed by 40 mg every 12 have been initiated  Performing serial CBCs to monitor hemoglobin and hematocrit  1 unit packed red blood cell transfusion already ordered by the emergency department staff.  Will reassess patient and repeat H/H after transfusion to determine if additional units are necessary.  Case is already been discussed with Dr. Silverio Decamp by the emergency department provider who states that gastroenterology will evaluate the patient this morning in consultation for possible endoscopic work-up.  Keeping patient n.p.o. for now.  Active Problems:   Sickle cell crisis ([place])   Concerned that patient's acute sickle cell crisis was precipitated by acute gastrointestinal bleeding  Providing patient with fentanyl PCA pump for analgesia  Patient reports severe headaches with Dilaudid.  Morphine should preferably not be used in end-stage renal  disease.  I am hoping that patient's pain will substantially improved status post transfusion.      Essential hypertension   Holding home regimen of diuretics  And doing home regimen of Coreg  Monitoring for episodic hypotension  Gout   Continue home regimen of low-dose allopurinol    End stage renal disease [facility])   Patient is on Tuesday Thursday Saturday schedule  Patient is due to have hemodialysis today  We will transfer patient to [facility] and consult nephrology later this morning for resumption of dialysis    Permanent atrial fibrillation [facility])   Patient is currently rate controlled  Monitoring patient on telemetry  Holding Eliquis due to gastrointestinal bleeding  Continuing Coreg for rate control    Chronic systolic CHF (congestive heart failure) (HCC)   No clinical evidence of volume overload at this time    Prolonged QT interval  Monitoring patient on telemetry  Avoiding QT prolonging agents  Potassium is at target  Magnesium is pending   Code Status:  Full code Family Communication: deferred   Status is: Observation  The patient remains OBS appropriate and will d/c before 2 midnights.  Dispo: The patient is from: Home              Anticipated d/c is to: Home              Anticipated d/c date is: 2 days              Patient currently is not medically stable to d/c.   Difficult to place patient No        Vernelle Emerald MD [facility] Pager [phone]  If 7PM-7AM, please contact night-coverage [email or web address] Use universal [place] password for that web site. If you do not have the password, please call the hospital operator.  08/02/2020, 6:24 AM

## 2020-08-02 NOTE — Consult Note (Signed)
Referring Provider:  [facility]         Primary Care Physician:  Dorena Dew, FNP Primary Gastroenterologist:  Althia Forts - transferring to [facility]           We were asked to see this patient for:   hematemesis               ASSESSMENT / PLAN:   # 65 yo male with hematemesis / FOBT + on Eliquis and acute on chronic macrocytic anemia ( MCV is 115). He has had nearly a 3 gram drop in hgb since early December from 9 to 6.2. Stools are heme positive. Decline in hgb likely from upper GI bleed but is he also in a sickle cell crisis with bilateral lower leg pain and mild elevation in bilirubin?  --On continuous PPI infusion --Received a unit of blood this am. Post CBC is pending. --Eliquis is on hold.  --Will need EGD. Timing to be determined as patient is transferring to [facility] at some point for dialysis.  EGD will most likely be sometime tomorrow. The risks and benefits of EGD were discussed and the patient agrees to proceed.  --Can have clears from GI standpoint. NPO after MN.   # Chronic heart failure . Elevated BNP 2730  # ESRD on HD Tu, Thur, Sat. Transferring to [facility].   # Sickle cell, in crisis now?   # Afib, home Eliquis on hold   # Chronic thrombocytopenia. Platelets 99. Baseline in low 100s.      HPI:                                                                                                                             Chief Complaint:  vomiting  Patrick [name] is a 65 y.o. male with multiple medical problems including sickle cell, systolic heart failure, HTN, gout, ESRD on HD,  atrial fibrillation on Eliquis, hx of GI bleeding / PUD, COVID infection, cholelithiasis, legally blind, tobacco abuse  Patient admitted yesterday with bilateral lower leg pain and hematemesis.  Patient says be became nauseated yesterday at work around 1 pm.  He also developed bilateral lower pain similar to when he has had the beginning of a sickle cell crisis . He began vomiting  around 6 PM, had several episodes of dark emesis.  No associated abdominal pain.  Denies history of GERD.  Not on acid blockers at home.  No diarrhea nor black stools.  Patient denies NSAID use.  In the ED patient was afebrile, vital signs stable.  WBC 4.9, hemoglobin 6.2, down from baseline of around 9.    PREVIOUS ENDOSCOPIC EVALUATIONS / PERTINENT STUDIES   April 2019 colonoscopy for unexplained GI bleed -Small internal hemorrhoids. - Otherwise normal colonoscopy to terminal ileum.   April 2019 EGD for presumed GI bleed --Erosive gastritis. Biopsied. No active bleeding.   *Clo test negative.    Past Medical History:  Diagnosis Date  . Blood  dyscrasia   . Blood transfusion    "I've had a bunch of them"  . CHF (congestive heart failure) ([place])    followed by hf clinic  . Chronic kidney disease (CKD), stage III (moderate) (HCC)   . Dialysis patient ([place]) 04/2019   AV fistula (right arm)  . Gout   . [id number])    "probably weekly" (01/13/2014)  . Legally blind   . Shortness of breath dyspnea   . Sickle cell disease, type SS ([place])   . Swelling abdomen   . Swelling of extremity, left   . Swelling of extremity, right     Past Surgical History:  Procedure Laterality Date  . A/V FISTULAGRAM Right 07/29/2020   Procedure: A/V FISTULAGRAM - Right Arm;  Surgeon: Angelia Mould, MD;  Location: [place] [facility];  Service: Cardiovascular;  Laterality: Right;  . BASCILIC VEIN TRANSPOSITION Right 04/12/2014   Procedure: BASILIC VEIN TRANSPOSITION;  Surgeon: Elam Dutch, MD;  Location: [place];  Service: Vascular;  Laterality: Right;  . CARDIAC CATHETERIZATION  05/2011  . CARDIOVERSION N/A 06/05/2018   Procedure: CARDIOVERSION;  Surgeon: Jolaine Artist, MD;  Location: [facility] ENDOSCOPY;  Service: Cardiovascular;  Laterality: N/A;  . CARDIOVERSION N/A 10/06/2019   Procedure: CARDIOVERSION;  Surgeon: Jolaine Artist, MD;  Location: [place];  Service: Cardiovascular;   Laterality: N/A;  . COLONOSCOPY WITH PROPOFOL N/A 09/24/2017   Procedure: COLONOSCOPY WITH PROPOFOL;  Surgeon: Jackquline Denmark, MD;  Location: [facility] ENDOSCOPY;  Service: Endoscopy;  Laterality: N/A;  . ESOPHAGOGASTRODUODENOSCOPY N/A 09/19/2017   Procedure: ESOPHAGOGASTRODUODENOSCOPY (EGD);  Surgeon: Jackquline Denmark, MD;  Location: [facility] ENDOSCOPY;  Service: Endoscopy;  Laterality: N/A;  . LEFT AND RIGHT HEART CATHETERIZATION WITH CORONARY ANGIOGRAM N/A 06/11/2011   Procedure: LEFT AND RIGHT HEART CATHETERIZATION WITH CORONARY ANGIOGRAM;  Surgeon: Larey Dresser, MD;  Location: [facility] CATH LAB;  Service: Cardiovascular;  Laterality: N/A;  . PERIPHERAL VASCULAR BALLOON ANGIOPLASTY Right 07/29/2020   Procedure: PERIPHERAL VASCULAR BALLOON ANGIOPLASTY;  Surgeon: Angelia Mould, MD;  Location: [place] [facility];  Service: Cardiovascular;  Laterality: Right;  arm fistula  . TEE WITHOUT CARDIOVERSION N/A 12/27/2015   Procedure: TRANSESOPHAGEAL ECHOCARDIOGRAM (TEE);  Surgeon: Sueanne Margarita, MD;  Location: [facility] ENDOSCOPY;  Service: Cardiovascular;  Laterality: N/A;    Prior to Admission medications   Medication Sig Start Date End Date Taking? Authorizing Provider  acetaminophen (TYLENOL) 500 MG tablet Take 1,000 mg by mouth every 6 (six) hours as needed for moderate pain or headache.   Yes [provider]  albuterol (VENTOLIN HFA) 108 (90 Base) MCG/ACT inhaler Inhale 2 puffs into the lungs every 6 (six) hours as needed for wheezing or shortness of breath. 05/09/20  Yes Freddi Starr, MD  allopurinol (ZYLOPRIM) 100 MG tablet Take 1 tablet (100 mg total) by mouth daily. Patient taking differently: Take 100 mg by mouth 2 (two) times daily. 05/10/20  Yes Dorena Dew, FNP  carvedilol (COREG) 12.5 MG tablet TAKE 1 TABLET BY MOUTH 2 (TWO) TIMES DAILY WITH A MEAL. Patient taking differently: Take 12.5 mg by mouth 2 (two) times daily with a meal. 01/13/20  Yes Bensimhon, Shaune Pascal, MD   diphenhydramine-acetaminophen (TYLENOL PM) 25-500 MG TABS tablet Take 2 tablets by mouth at bedtime as needed (sleep).   Yes [provider]  ELIQUIS 5 MG TABS tablet TAKE 1 TABLET BY MOUTH TWICE A DAY Patient taking differently: Take 5 mg by mouth 2 (two) times daily. 01/25/20  Yes Bensimhon,  Shaune Pascal, MD  ferric citrate (AURYXIA) 1 GM 210 MG(Fe) tablet Take 210 mg by mouth See admin instructions. Take 210 mg with each meal and snack   Yes [provider]  folic acid (FOLVITE) 1 MG tablet TAKE 1 TABLET BY MOUTH EVERY DAY Patient taking differently: Take 1 mg by mouth daily. TAKE 1 TABLET BY MOUTH EVERY DAY 05/11/20  Yes Vevelyn Francois, NP  gabapentin (NEURONTIN) 100 MG capsule Take 1 capsule (100 mg total) by mouth at bedtime. Patient taking differently: Take 100 mg by mouth at bedtime as needed (pain). 05/10/20  Yes Dorena Dew, FNP  hydrALAZINE (APRESOLINE) 25 MG tablet Take 1 tablet (25 mg total) by mouth in the morning and at bedtime. DO NOT TAKE AM DOSES ON DIALYSIS DAYS Patient taking differently: Take 25 mg by mouth See admin instructions. Take 1 tablet by mouth twice daily all days, EXCEPT on dialysis day (T, Th, Sat) take in the evening only 01/27/20 05/23/20 Yes Bensimhon, Shaune Pascal, MD  hydroxyurea (HYDREA) 500 MG capsule TAKE 1 CAPSULE BY MOUTH EVERY DAY WITH FOOD TO MINIMIZE GI SIDE EFFECTS Patient taking differently: Take 500 mg by mouth daily. TAKE 1 CAPSULE BY MOUTH EVERY DAY WITH FOOD TO MINIMIZE GI SIDE EFFECTS 02/05/20  Yes Dorena Dew, FNP  isosorbide mononitrate (IMDUR) 30 MG 24 hr tablet Take 1 tablet (30 mg total) by mouth daily. DO NOT TAKE AM ON DIALYSIS DAYS. Patient taking differently: Take 30 mg by mouth daily. 01/27/20  Yes Bensimhon, Shaune Pascal, MD  lidocaine-prilocaine (EMLA) cream Apply 1 application topically daily as needed (port access).  09/08/19  Yes [provider]  metolazone (ZAROXOLYN) 5 MG tablet Take 1 tablet (5 mg total) by  mouth daily as needed. Patient taking differently: Take 5 mg by mouth daily as needed (fluid). 05/23/20  Yes Bensimhon, Shaune Pascal, MD  Oxycodone HCl 10 MG TABS Take 1 tablet (10 mg total) by mouth every 4 (four) hours as needed (pain). 07/25/20  Yes Dorena Dew, FNP  tiZANidine (ZANAFLEX) 4 MG tablet Take 0.5 tablets (2 mg total) by mouth every 8 (eight) hours as needed for muscle spasms. 06/08/20  Yes Dorena Dew, FNP  torsemide (DEMADEX) 20 MG tablet Take 80 mg by mouth daily. 03/31/20  Yes [provider]    Current Facility-Administered Medications  Medication Dose Route Frequency Provider Last Rate Last Admin  . 0.9 %  sodium chloride infusion  250 mL Intravenous PRN Elam Dutch, MD      . acetaminophen (TYLENOL) tablet 650 mg  650 mg Oral Q6H PRN Shalhoub, Sherryll Burger, MD       Or  . acetaminophen (TYLENOL) suppository 650 mg  650 mg Rectal Q6H PRN Shalhoub, Sherryll Burger, MD      . allopurinol (ZYLOPRIM) tablet 100 mg  100 mg Oral BID Vernelle Emerald, MD   100 mg at 08/02/20 1015  . carvedilol (COREG) tablet 12.5 mg  12.5 mg Oral BID WC Shalhoub, Sherryll Burger, MD   12.5 mg at 08/02/20 0852  . fentaNYL (SUBLIMAZE) injection 75 mcg  75 mcg Intravenous Q2H PRN Dorena Dew, FNP   75 mcg at 08/02/20 0848  . fentaNYL 50 mcg/mL PCA injection   Intravenous Q4H Shalhoub, Sherryll Burger, MD      . folic acid (FOLVITE) tablet 1 mg  1 mg Oral Daily Shalhoub, Sherryll Burger, MD   1 mg at 08/02/20 1015  . [START ON 08/03/2020] isosorbide  mononitrate (IMDUR) 24 hr tablet 30 mg  30 mg Oral Once per day on Sun Mon Wed Fri Vernelle Emerald, MD      . naloxone [facility]) injection 0.4 mg  0.4 mg Intravenous PRN Vernelle Emerald, MD       And  . sodium chloride flush (NS) 0.9 % injection 9 mL  9 mL Intravenous PRN Shalhoub, Sherryll Burger, MD      . Derrill Memo ON 08/05/2020] pantoprazole (PROTONIX) injection 40 mg  40 mg Intravenous Q12H Shalhoub, Sherryll Burger, MD      . polyethylene glycol (MIRALAX / GLYCOLAX)  packet 17 g  17 g Oral Daily PRN Shalhoub, Sherryll Burger, MD      . sodium chloride flush (NS) 0.9 % injection 3 mL  3 mL Intravenous Q12H Fields, Charles E, MD      . sodium chloride flush (NS) 0.9 % injection 3 mL  3 mL Intravenous PRN Elam Dutch, MD       Current Outpatient Medications  Medication Sig Dispense Refill  . acetaminophen (TYLENOL) 500 MG tablet Take 1,000 mg by mouth every 6 (six) hours as needed for moderate pain or headache.    . albuterol (VENTOLIN HFA) 108 (90 Base) MCG/ACT inhaler Inhale 2 puffs into the lungs every 6 (six) hours as needed for wheezing or shortness of breath. 8 g 6  . allopurinol (ZYLOPRIM) 100 MG tablet Take 1 tablet (100 mg total) by mouth daily. (Patient taking differently: Take 100 mg by mouth 2 (two) times daily.) 30 tablet 1  . carvedilol (COREG) 12.5 MG tablet TAKE 1 TABLET BY MOUTH 2 (TWO) TIMES DAILY WITH A MEAL. (Patient taking differently: Take 12.5 mg by mouth 2 (two) times daily with a meal.) 180 tablet 2  . diphenhydramine-acetaminophen (TYLENOL PM) 25-500 MG TABS tablet Take 2 tablets by mouth at bedtime as needed (sleep).    Marland Kitchen ELIQUIS 5 MG TABS tablet TAKE 1 TABLET BY MOUTH TWICE A DAY (Patient taking differently: Take 5 mg by mouth 2 (two) times daily.) 60 tablet 11  . ferric citrate (AURYXIA) 1 GM 210 MG(Fe) tablet Take 210 mg by mouth See admin instructions. Take 210 mg with each meal and snack    . folic acid (FOLVITE) 1 MG tablet TAKE 1 TABLET BY MOUTH EVERY DAY (Patient taking differently: Take 1 mg by mouth daily. TAKE 1 TABLET BY MOUTH EVERY DAY) 90 tablet 3  . gabapentin (NEURONTIN) 100 MG capsule Take 1 capsule (100 mg total) by mouth at bedtime. (Patient taking differently: Take 100 mg by mouth at bedtime as needed (pain).) 30 capsule 3  . hydrALAZINE (APRESOLINE) 25 MG tablet Take 1 tablet (25 mg total) by mouth in the morning and at bedtime. DO NOT TAKE AM DOSES ON DIALYSIS DAYS (Patient taking differently: Take 25 mg by mouth See  admin instructions. Take 1 tablet by mouth twice daily all days, EXCEPT on dialysis day (T, Th, Sat) take in the evening only) 60 tablet 3  . hydroxyurea (HYDREA) 500 MG capsule TAKE 1 CAPSULE BY MOUTH EVERY DAY WITH FOOD TO MINIMIZE GI SIDE EFFECTS (Patient taking differently: Take 500 mg by mouth daily. TAKE 1 CAPSULE BY MOUTH EVERY DAY WITH FOOD TO MINIMIZE GI SIDE EFFECTS) 90 capsule 3  . isosorbide mononitrate (IMDUR) 30 MG 24 hr tablet Take 1 tablet (30 mg total) by mouth daily. DO NOT TAKE AM ON DIALYSIS DAYS. (Patient taking differently: Take 30 mg by mouth daily.) 30 tablet 6  .  lidocaine-prilocaine (EMLA) cream Apply 1 application topically daily as needed (port access).     . metolazone (ZAROXOLYN) 5 MG tablet Take 1 tablet (5 mg total) by mouth daily as needed. (Patient taking differently: Take 5 mg by mouth daily as needed (fluid).) 10 tablet 0  . Oxycodone HCl 10 MG TABS Take 1 tablet (10 mg total) by mouth every 4 (four) hours as needed (pain). 60 tablet 0  . tiZANidine (ZANAFLEX) 4 MG tablet Take 0.5 tablets (2 mg total) by mouth every 8 (eight) hours as needed for muscle spasms. 30 tablet 1  . torsemide (DEMADEX) 20 MG tablet Take 80 mg by mouth daily.      Allergies as of 08/01/2020  . (No Known Allergies)    Family History  Problem Relation Age of Onset  . Alcohol abuse Mother   . Liver disease Mother   . Cirrhosis Mother   . Heart attack Mother     Social History   Socioeconomic History  . Marital status: Divorced    Spouse name: Not on file  . Number of children: Not on file  . Years of education: Not on file  . Highest education level: Not on file  Occupational History  . Not on file  Tobacco Use  . Smoking status: Former Smoker    Packs/day: 0.25    Years: 41.00    Pack years: 10.25    Types: Cigarettes    Quit date: 12/08/2015    Years since quitting: 4.6  . Smokeless tobacco: Never Used  Vaping Use  . Vaping Use: Never used  Substance and Sexual  Activity  . Alcohol use: Yes    Alcohol/week: 3.0 standard drinks    Types: 1 Glasses of wine, 2 Cans of beer per week    Comment: occasional  . Drug use: No  . Sexual activity: Not Currently  Other Topics Concern  . Not on file  Social History Narrative  . Not on file   Social Determinants of Health   Financial Resource Strain: Not on file  Food Insecurity: Not on file  Transportation Needs: No Transportation Needs  . Lack of Transportation (Medical): No  . Lack of Transportation (Non-Medical): No  Physical Activity: Sufficiently Active  . Days of Exercise per Week: 5 days  . Minutes of Exercise per Session: 90 min  Stress: Not on file  Social Connections: Not on file  Intimate Partner Violence: Not on file    Review of Systems: All systems reviewed and negative except where noted in HPI.  OBJECTIVE:    Physical Exam: Vital signs in last 24 hours: Temp:  [97.7 F (36.5 C)-98.1 F (36.7 C)] 98.1 F (36.7 C) (02/15 0930) Pulse Rate:  [41-96] 86 (02/15 0930) Resp:  [9-24] 13 (02/15 0930) BP: (119-149)/(65-94) 127/80 (02/15 0930) SpO2:  [88 %-100 %] 96 % (02/15 0930) Weight:  [69.4 kg] 69.4 kg (02/14 2003)   General:   Alert male in NAD Psych:  Pleasant, cooperative. Normal mood and affect. Eyes:  Pupils equal, sclera clear, no icterus.   Conjunctiva pink. Ears:  Normal auditory acuity. Nose:  No deformity, discharge,  or lesions. Neck:  Supple; no masses Lungs:  Clear throughout to auscultation.   No wheezes, crackles, or rhonchi.  Heart:  Regular rate and rhythm,  no lower extremity edema Abdomen:  Soft, non-distended, nontender, BS active, no palp mass   Rectal:  Deferred  Msk:  Symmetrical without gross deformities. . Neurologic:  Alert and  oriented  x4;  grossly normal neurologically. Skin:  Intact without significant lesions or rashes.  Filed Weights   08/01/20 2003  Weight: 69.4 kg     Scheduled inpatient medications . allopurinol  100 mg Oral BID   . carvedilol  12.5 mg Oral BID WC  . fentaNYL   Intravenous [id number]  . folic acid  1 mg Oral Daily  . [START ON 08/03/2020] isosorbide mononitrate  30 mg Oral Once per day on Sun Mon Wed Fri  . [START ON 08/05/2020] pantoprazole  40 mg Intravenous Q12H  . sodium chloride flush  3 mL Intravenous Q12H      Intake/Output from previous day: No intake/output data recorded. Intake/Output this shift: Total I/O In: 315 [Blood:315] Out: -    Lab Results: Recent Labs    08/01/20 2047  WBC 4.9  HGB 6.2*  HCT 17.2*  PLT 99*   BMET Recent Labs    08/01/20 2047 08/02/20 0853  NA 131* 133*  K 4.6 4.9  CL 91* 95*  CO2 23 22  GLUCOSE 85 89  BUN 89* 98*  CREATININE 6.79* 7.09*  CALCIUM 8.4* 8.3*   LFT Recent Labs    08/02/20 0853  PROT 6.8  ALBUMIN 3.3*  AST 24  ALT 17  ALKPHOS 92  BILITOT 2.6*   PT/INR Recent Labs    08/02/20 0853  LABPROT 18.1*  INR 1.6*   Hepatitis Panel No results for input(s): HEPBSAG, HCVAB, HEPAIGM, HEPBIGM in the last 72 hours.   . CBC Latest Ref Rng & Units 08/01/2020 07/29/2020 05/24/2020  WBC 4.0 - 10.5 K/uL 4.9 - 4.2  Hemoglobin 13.0 - 17.0 g/dL 6.2(LL) 8.5(L) 9.1(L)  Hematocrit 39.0 - 52.0 % 17.2(L) 25.0(L) 25.6(L)  Platelets 150 - 400 K/uL 99(L) - 88(LL)    . CMP Latest Ref Rng & Units 08/02/2020 08/01/2020 07/29/2020  Glucose 70 - 99 mg/dL 89 85 94  BUN 8 - 23 mg/dL 98(H) 89(H) 45(H)  Creatinine 0.61 - 1.24 mg/dL 7.09(H) 6.79(H) 4.80(H)  Sodium 135 - 145 mmol/L 133(L) 131(L) 135  Potassium 3.5 - 5.1 mmol/L 4.9 4.6 4.4  Chloride 98 - 111 mmol/L 95(L) 91(L) 93(L)  CO2 22 - 32 mmol/L 22 23 -  Calcium 8.9 - 10.3 mg/dL 8.3(L) 8.4(L) -  Total Protein 6.5 - 8.1 g/dL 6.8 7.4 -  Total Bilirubin 0.3 - 1.2 mg/dL 2.6(H) 2.9(H) -  Alkaline Phos 38 - 126 U/L 92 105 -  AST 15 - 41 U/L 24 29 -  ALT 0 - 44 U/L 17 21 -   Studies/Results: DG Chest Port 1 View  Result Date: 08/01/2020 CLINICAL DATA:  Shortness of breath EXAM: PORTABLE CHEST 1  VIEW COMPARISON:  09/11/2019, CT chest 05/16/2020, 03/24/2019 FINDINGS: Mild cardiomegaly with aortic atherosclerosis. No focal opacity or pleural effusion. No pneumothorax. IMPRESSION: Mild cardiomegaly. Electronically Signed   By: Donavan Foil M.D.   On: 08/01/2020 23:01    Principal Problem:   Acute upper GI bleed Active Problems:   Essential hypertension   Sickle cell crisis (HCC)   Gout   End stage renal disease (HCC)   Permanent atrial fibrillation (HCC)   Chronic systolic CHF (congestive heart failure) (HCC)   Prolonged QT interval   GI bleed    Tye Savoy, NP-C @  08/02/2020, 11:05 AM

## 2020-08-02 NOTE — Progress Notes (Signed)
Patient arrived to unit, VSS. Will continue to monitor.

## 2020-08-03 DIAGNOSIS — K922 Gastrointestinal hemorrhage, unspecified: Secondary | ICD-10-CM | POA: Diagnosis not present

## 2020-08-03 LAB — TYPE AND SCREEN
ABO/RH(D): AB POS
Antibody Screen: NEGATIVE
Unit division: 0

## 2020-08-03 LAB — BPAM RBC
Blood Product Expiration Date: 202203182359
ISSUE DATE / TIME: 202202150518
Unit Type and Rh: 5100

## 2020-08-03 LAB — MRSA PCR SCREENING: MRSA by PCR: NEGATIVE

## 2020-08-03 MED ORDER — SODIUM CHLORIDE 0.9 % IV SOLN
INTRAVENOUS | Status: DC
  Administered 2020-08-04: 500 mL via INTRAVENOUS

## 2020-08-03 MED ORDER — NICOTINE 21 MG/24HR TD PT24
21.0000 mg | MEDICATED_PATCH | Freq: Every day | TRANSDERMAL | Status: DC
  Filled 2020-08-03: qty 1

## 2020-08-03 MED ORDER — OXYCODONE HCL 5 MG PO TABS
10.0000 mg | ORAL_TABLET | ORAL | Status: DC | PRN
  Administered 2020-08-04 – 2020-08-05 (×2): 10 mg via ORAL
  Filled 2020-08-03 (×2): qty 2

## 2020-08-03 NOTE — Progress Notes (Addendum)
Subjective: Patrick [name] is a 65 year old male with a medical history significant for sickle cell disease type SS, end-stage renal disease on dialysis, atrial fibrillation on Eliquis, history of GI bleeding in the past with several gastric ulcers seen on EGD 0000000, systolic congestive heart failure, gout, and hypertension was admitted for possible GI bleed in the setting of sickle cell pain crises.  Patient continues to complain of significant pain primarily to bilateral lower extremities.  He rates pain intensity is 7/10.  Pain is consistent with his typical sickle cell pain crisis. Patient undergoes dialysis on Tuesday, Thursday, and Saturday.  He underwent dialysis on last night without complication.  Patient is scheduled for upper GI on 08/04/2020. Today, he denies headache, chest pain, shortness of breath, urinary symptoms, nausea, vomiting, or diarrhea.  Objective:  Vital signs in last 24 hours:  Vitals:   08/03/20 0800 08/03/20 0900 08/03/20 1146 08/03/20 1200  BP:  135/76 114/63   Pulse:   72   Resp: '16  20 18  '$ Temp:   98.4 F (36.9 C)   TempSrc:   Oral   SpO2: 100% 99% 100% 99%  Weight:      Height:        Intake/Output from previous day:   Intake/Output Summary (Last 24 hours) at 08/03/2020 1339 Last data filed at 08/03/2020 1100 Gross per 24 hour  Intake 629.67 ml  Output 3200 ml  Net -2570.33 ml   Physical Exam Eyes:     Pupils: Pupils are equal, round, and reactive to light.  Cardiovascular:     Rate and Rhythm: Normal rate. Rhythm irregular.     Heart sounds: Murmur heard.    Pulmonary:     Effort: Pulmonary effort is normal.     Breath sounds: Normal breath sounds.  Abdominal:     General: Abdomen is flat. Bowel sounds are normal.  Skin:    General: Skin is warm.  Neurological:     General: No focal deficit present.     Mental Status: He is alert. Mental status is at baseline.  Psychiatric:        Mood and Affect: Mood normal.        Behavior:  Behavior normal.        Thought Content: Thought content normal.        Judgment: Judgment normal.     Lab Results:  Basic Metabolic Panel:    Component Value Date/Time   NA 133 (L) 08/02/2020 0853   NA 135 05/24/2020 1119   K 4.9 08/02/2020 0853   CL 95 (L) 08/02/2020 0853   CO2 22 08/02/2020 0853   BUN 98 (H) 08/02/2020 0853   BUN 25 05/24/2020 1119   CREATININE 7.09 (H) 08/02/2020 0853   CREATININE 2.90 (H) 08/11/2015 1359   GLUCOSE 89 08/02/2020 0853   CALCIUM 8.3 (L) 08/02/2020 0853   CBC:    Component Value Date/Time   WBC 4.8 08/02/2020 1805   HGB 7.1 (L) 08/02/2020 1805   HGB 9.1 (L) 05/24/2020 1119   HCT 19.5 (L) 08/02/2020 1805   HCT 25.6 (L) 05/24/2020 1119   PLT 96 (L) 08/02/2020 1805   PLT 88 (LL) 05/24/2020 1119   MCV 106.6 (H) 08/02/2020 1805   MCV 124 (H) 05/24/2020 1119   NEUTROABS 3.1 08/01/2020 2047   NEUTROABS 2.5 05/24/2020 1119   LYMPHSABS 0.8 08/01/2020 2047   LYMPHSABS 0.9 05/24/2020 1119   MONOABS 0.8 08/01/2020 2047   EOSABS 0.1 08/01/2020 2047   EOSABS  0.1 05/24/2020 1119   BASOSABS 0.0 08/01/2020 2047   BASOSABS 0.1 05/24/2020 1119    Recent Results (from the past 240 hour(s))  SARS CORONAVIRUS 2 (TAT 6-24 HRS) Nasopharyngeal Nasopharyngeal Swab     Status: None   Collection Time: 07/28/20 11:11 AM   Specimen: Nasopharyngeal Swab  Result Value Ref Range Status   SARS Coronavirus 2 NEGATIVE NEGATIVE Final    Comment: (NOTE) SARS-CoV-2 target nucleic acids are NOT DETECTED.  The SARS-CoV-2 RNA is generally detectable in upper and lower respiratory specimens during the acute phase of infection. Negative results do not preclude SARS-CoV-2 infection, do not rule out co-infections with other pathogens, and should not be used as the sole basis for treatment or other patient management decisions. Negative results must be combined with clinical observations, patient history, and epidemiological information. The expected result is  Negative.  Fact Sheet for Patients: SugarRoll.be  Fact Sheet for Healthcare Providers: https://www.woods-mathews.com/  This test is not yet approved or cleared by the [place] FDA and  has been authorized for detection and/or diagnosis of SARS-CoV-2 by FDA under an Emergency Use Authorization (EUA). This EUA will remain  in effect (meaning this test can be used) for the duration of the COVID-19 declaration under Se ction 564(b)(1) of the Act, 21 U.S.C. section 360bbb-3(b)(1), unless the authorization is terminated or revoked sooner.  Performed at [facility], [place] [address], [place], [place] [address]   MRSA PCR Screening     Status: None   Collection Time: 08/02/20 11:56 PM   Specimen: Nasal Mucosa; Nasopharyngeal  Result Value Ref Range Status   MRSA by PCR NEGATIVE NEGATIVE Final    Comment:        The GeneXpert MRSA Assay (FDA approved for NASAL specimens only), is one component of a comprehensive MRSA colonization surveillance program. It is not intended to diagnose MRSA infection nor to guide or monitor treatment for MRSA infections. Performed at [facility], [place] [address] [place], [place] [address]     Studies/Results: DG Chest Port 1 View  Result Date: 08/01/2020 CLINICAL DATA:  Shortness of breath EXAM: PORTABLE CHEST 1 VIEW COMPARISON:  09/11/2019, CT chest 05/16/2020, 03/24/2019 FINDINGS: Mild cardiomegaly with aortic atherosclerosis. No focal opacity or pleural effusion. No pneumothorax. IMPRESSION: Mild cardiomegaly. Electronically Signed   By: Donavan Foil M.D.   On: 08/01/2020 23:01    Medications: Scheduled Meds: . allopurinol  100 mg Oral BID  . carvedilol  12.5 mg Oral BID WC  . Chlorhexidine Gluconate Cloth  6 each Topical Q0600  . cinacalcet  30 mg Oral Q T,Th,Sa-HD  . darbepoetin (ARANESP) injection - DIALYSIS  200 mcg Intravenous Q Tue-HD  . doxercalciferol  6 mcg Intravenous Q  T,Th,Sa-HD  . fentaNYL   Intravenous [id number]  . folic acid  1 mg Oral Daily  . isosorbide mononitrate  30 mg Oral Daily  . pantoprazole  40 mg Intravenous Q12H   Continuous Infusions: . sodium chloride     PRN Meds:.sodium chloride, acetaminophen **OR** acetaminophen, naloxone **AND** sodium chloride flush, polyethylene glycol  Consultants:  Gastroenterology  Nephrology  Procedures:  Dialysis   Upper endoscopy on tomorrow  Antibiotics:  None  Assessment/Plan: Principal Problem:   Acute upper GI bleed Active Problems:   Essential hypertension   Sickle cell crisis (HCC)   Gout   End stage renal disease (HCC)   Permanent atrial fibrillation (HCC)   Chronic systolic CHF (congestive heart failure) (HCC)   Prolonged  QT interval   GI bleed  GI bleed with acute blood loss anemia: Patient presented to the emergency department with hematemesis and a positive Hemoccult.  Eliquis continues to be held.  Continue pantoprazole injections 40 mg every 12 hours.  Patient is followed by gastroenterology.  Will undergo EGD on 08/04/2020. N.p.o. after midnight Appreciate gastroenterology's input in patient's plan of care.  Sickle cell pain crisis: Continue IV fentanyl PCA without any changes in settings today Add oxycodone 10 mg every 4 hours as needed moderate to severe breakthrough pain Monitor vital signs very closely, reevaluate pain scale regularly, and supplemental oxygen as needed. CBC with differential and LDH in a.m.  Anemia of chronic disease: Hemoglobin 7.1 on today.  Patient is s/p 1 unit PRBCs on yesterday.  Repeat CBC in a.m.  ESRD: Continue hemodialysis on Tuesday, Thursday, Saturday schedule. Patient underwent dialysis on yesterday.  Patient will undergo dialysis on tomorrow Appreciate nephrology's input with this patient.  Essential hypertension: Stable.  Continue home medications.  History of CHF: Stable.  Continue home medications.  Most recent echocardiogram  on 02/17/2020 EF 40 to 45%. Continue cardiac monitoring Also, continue home medications Followed as an outpatient by cardiology  Tobacco dependence: Smoking cessation instruction/counseling given:  counseled patient on the dangers of tobacco use, advised patient to stop smoking, and reviewed strategies to maximize success  Nicotine patch   Code Status: Full Code Family Communication: N/A Disposition Plan: Not yet ready for discharge  [place], MSN, FNP-C [facility] [address] [place], [place] [address] [phone] If 5PM-8AM, please contact night-coverage.  08/03/2020, 1:39 PM  LOS: 1 day

## 2020-08-03 NOTE — Plan of Care (Signed)
  Problem: Education: Goal: Knowledge of General Education information will improve Description: Including pain rating scale, medication(s)/side effects and non-pharmacologic comfort measures Outcome: Progressing   Problem: Health Behavior/Discharge Planning: Goal: Ability to manage health-related needs will improve Outcome: Progressing   Problem: Clinical Measurements: Goal: Respiratory complications will improve Outcome: Progressing   

## 2020-08-03 NOTE — Progress Notes (Addendum)
Daily Rounding Note  08/03/2020, 12:14 PM  LOS: 1 day   SUBJECTIVE:   Chief complaint:   Anemia.  Dark stools.  No BMs yesterday or today.  No nausea.  No abdominal pain.  Overall feels okay.  He is hungry as he did not get breakfast and just only had clears for lunch.  OBJECTIVE:         Vital signs in last 24 hours:    Temp:  [97.7 F (36.5 C)-98.4 F (36.9 C)] 98.4 F (36.9 C) (02/16 1146) Pulse Rate:  [72-88] 72 (02/16 1146) Resp:  [10-20] 20 (02/16 1146) BP: (107-146)/(59-90) 114/63 (02/16 1146) SpO2:  [94 %-100 %] 99 % (02/16 0900) FiO2 (%):  [28 %] 28 % (02/16 0352) Weight:  [64.8 kg-71 kg] 64.8 kg (02/16 0025) Last BM Date: 07/31/20 Filed Weights   08/02/20 1911 08/02/20 2229 08/03/20 0025  Weight: 71 kg 67.9 kg 64.8 kg   General: Looks well Heart: RRR Chest: No labored breathing Abdomen: Tender, nondistended, active bowel sounds, soft Extremities: No CCE Neuro/Psych: Alert.  Appropriate.  Fluid speech.  Moves all 4 limbs.  Intake/Output from previous day: 02/15 0701 - 02/16 0700 In: 944.7 [P.O.:480; I.V.:149.7; Blood:315] Out: 2900 [Urine:400]  Intake/Output this shift: No intake/output data recorded.  Lab Results: Recent Labs    08/02/20 1529 08/02/20 1624 08/02/20 1805  WBC 4.8 4.9 4.8  HGB 7.3* 7.6* 7.1*  HCT 19.3* 20.8* 19.5*  PLT 101* 95* 96*   BMET Recent Labs    08/01/20 2047 08/02/20 0853  NA 131* 133*  K 4.6 4.9  CL 91* 95*  CO2 23 22  GLUCOSE 85 89  BUN 89* 98*  CREATININE 6.79* 7.09*  CALCIUM 8.4* 8.3*   LFT Recent Labs    08/01/20 2047 08/02/20 0853  PROT 7.4 6.8  ALBUMIN 3.5 3.3*  AST 29 24  ALT 21 17  ALKPHOS 105 92  BILITOT 2.9* 2.6*   PT/INR Recent Labs    08/02/20 0853  LABPROT 18.1*  INR 1.6*   Hepatitis Panel No results for input(s): HEPBSAG, HCVAB, HEPAIGM, HEPBIGM in the last 72 hours.  Studies/Results: DG Chest Port 1 View  Result  Date: 08/01/2020 CLINICAL DATA:  Shortness of breath EXAM: PORTABLE CHEST 1 VIEW COMPARISON:  09/11/2019, CT chest 05/16/2020, 03/24/2019 FINDINGS: Mild cardiomegaly with aortic atherosclerosis. No focal opacity or pleural effusion. No pneumothorax. IMPRESSION: Mild cardiomegaly. Electronically Signed   By: Donavan Foil M.D.   On: 08/01/2020 23:01    Scheduled Meds: . allopurinol  100 mg Oral BID  . carvedilol  12.5 mg Oral BID WC  . Chlorhexidine Gluconate Cloth  6 each Topical Q0600  . cinacalcet  30 mg Oral Q T,Th,Sa-HD  . darbepoetin (ARANESP) injection - DIALYSIS  200 mcg Intravenous Q Tue-HD  . doxercalciferol  6 mcg Intravenous Q T,Th,Sa-HD  . fentaNYL   Intravenous A999333  . folic acid  1 mg Oral Daily  . isosorbide mononitrate  30 mg Oral Daily  . pantoprazole  40 mg Intravenous Q12H   Continuous Infusions: . sodium chloride     PRN Meds:.sodium chloride, acetaminophen **OR** acetaminophen, naloxone **AND** sodium chloride flush, polyethylene glycol   ASSESMENT:   *   Hematemesis. CLO negative erosive gastritis at 09/2017 EGD. Nonbleeding internal hemorrhoids on 09/2017 colonoscopy. Protonix 40 mg IV bid in place.  *    Blood loss anemia.  Baseline chronic anemia of his sickle cell and ESRD  disease.  Macrocytosis.   Hgb 6.2 >> 7.1.    *    Chronic Eliquis.  For A. fib.  Last dose  *    ESRD.  Hemodialysis TTS.  *    Question sickle cell crisis.  *    Chronic thrombocytopenia.  Platelets in mid 90s.  *    Congestive heart failure.  Elevated BNP.  Chest x-ray with mild cardiomegaly.  No congestion or effusion.  *     Hyponatremia at 133.   PLAN   *   Upper endoscopy at 1315 tomorrow.  *   Ordered renal diet going forward but will be n.p.o. after midnight for procedure tomorrow  Azucena Freed  08/03/2020, 12:14 PM Phone [phone]     Attending Physician Note   I have taken an interval history, reviewed the chart and examined the patient. I agree with the  Advanced Practitioner's note, impression and recommendations.   Hematemesis, anemia for EGD tomorrow   Sickle cell disease Afib holding Eliquis ESRD on HD CHF  Lucio Edward, MD [facility] [phone]

## 2020-08-03 NOTE — Progress Notes (Signed)
Patient ID: Patrick [name], male   DOB: 1955-11-20, 65 y.o.   MRN: [id number]  [facility] Progress Note   Assessment/ Plan:   1.  GI bleed with acute blood loss anemia: With preceding history of hematemesis and Hemoccult positive stool on Eliquis.  Seen by gastroenterology yesterday with plans noted for possible EGD today to evaluate etiology.  Clinically stable overnight without any overt losses and with slight downtrend of hemoglobin overnight. 2. ESRD: Continue hemodialysis on TTS schedule-underwent dialysis yesterday uneventfully.  Will order this for tomorrow, heparin free. 3.  Sickle cell crisis: With pain crisis on admission-ongoing pain management and status post PRBC transfusion. 4. CKD-MBD: Calcium level within acceptable range, phosphorus level borderline elevated-continue Hectorol and Sensipar for PTH control 5. Nutrition: Currently n.p.o. for EGD: Resume renal diet/fluid restriction when able to eat. 6. Hypertension: Blood pressure within acceptable range, continue to follow with ongoing management/HD.  Subjective:   Reports to be feeling well and complains of being hungry awaiting possible endoscopy today.  Denies any hematochezia or hematemesis overnight.   Objective:   BP 112/73 (BP Location: Left Arm)   Pulse 76   Temp 98.1 F (36.7 C) (Oral)   Resp 16   Ht '5\' 11"'$  (1.803 m)   Wt 64.8 kg   SpO2 100%   BMI 19.93 kg/m   Physical Exam: Gen: Comfortably resting in bed, not in any distress. CVS: Pulse regular rhythm and normal rate, S1 and S2 with ejection systolic murmur Resp: Clear to auscultation bilaterally, no distinct rales or rhonchi Abd: Soft, flat, nontender, bowel sounds normal Ext: No lower extremity edema.  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 allopurinol  100 mg Oral BID  . carvedilol  12.5 mg Oral BID WC  . Chlorhexidine Gluconate Cloth  6 each Topical Q0600  . cinacalcet  30 mg Oral Q T,Th,Sa-HD  . Darbepoetin Alfa      . darbepoetin (ARANESP) injection - DIALYSIS  200 mcg Intravenous Q Tue-HD  . doxercalciferol  6 mcg Intravenous Q T,Th,Sa-HD  . fentaNYL   Intravenous A999333  . folic acid  1 mg Oral Daily  . isosorbide mononitrate  30 mg Oral Daily  . pantoprazole  40 mg Intravenous Q12H   Elmarie Shiley, MD 08/03/2020, 9:12 AM

## 2020-08-03 NOTE — H&P (View-Only) (Signed)
Daily Rounding Note  08/03/2020, 12:14 PM  LOS: 1 day   SUBJECTIVE:   Chief complaint:   Anemia.  Dark stools.  No BMs yesterday or today.  No nausea.  No abdominal pain.  Overall feels okay.  He is hungry as he did not get breakfast and just only had clears for lunch.  OBJECTIVE:         Vital signs in last 24 hours:    Temp:  [97.7 F (36.5 C)-98.4 F (36.9 C)] 98.4 F (36.9 C) (02/16 1146) Pulse Rate:  [72-88] 72 (02/16 1146) Resp:  [10-20] 20 (02/16 1146) BP: (107-146)/(59-90) 114/63 (02/16 1146) SpO2:  [94 %-100 %] 99 % (02/16 0900) FiO2 (%):  [28 %] 28 % (02/16 0352) Weight:  [64.8 kg-71 kg] 64.8 kg (02/16 0025) Last BM Date: 07/31/20 Filed Weights   08/02/20 1911 08/02/20 2229 08/03/20 0025  Weight: 71 kg 67.9 kg 64.8 kg   General: Looks well Heart: RRR Chest: No labored breathing Abdomen: Tender, nondistended, active bowel sounds, soft Extremities: No CCE Neuro/Psych: Alert.  Appropriate.  Fluid speech.  Moves all 4 limbs.  Intake/Output from previous day: 02/15 0701 - 02/16 0700 In: 944.7 [P.O.:480; I.V.:149.7; Blood:315] Out: 2900 [Urine:400]  Intake/Output this shift: No intake/output data recorded.  Lab Results: Recent Labs    08/02/20 1529 08/02/20 1624 08/02/20 1805  WBC 4.8 4.9 4.8  HGB 7.3* 7.6* 7.1*  HCT 19.3* 20.8* 19.5*  PLT 101* 95* 96*   BMET Recent Labs    08/01/20 2047 08/02/20 0853  NA 131* 133*  K 4.6 4.9  CL 91* 95*  CO2 23 22  GLUCOSE 85 89  BUN 89* 98*  CREATININE 6.79* 7.09*  CALCIUM 8.4* 8.3*   LFT Recent Labs    08/01/20 2047 08/02/20 0853  PROT 7.4 6.8  ALBUMIN 3.5 3.3*  AST 29 24  ALT 21 17  ALKPHOS 105 92  BILITOT 2.9* 2.6*   PT/INR Recent Labs    08/02/20 0853  LABPROT 18.1*  INR 1.6*   Hepatitis Panel No results for input(s): HEPBSAG, HCVAB, HEPAIGM, HEPBIGM in the last 72 hours.  Studies/Results: DG Chest Port 1 View  Result  Date: 08/01/2020 CLINICAL DATA:  Shortness of breath EXAM: PORTABLE CHEST 1 VIEW COMPARISON:  09/11/2019, CT chest 05/16/2020, 03/24/2019 FINDINGS: Mild cardiomegaly with aortic atherosclerosis. No focal opacity or pleural effusion. No pneumothorax. IMPRESSION: Mild cardiomegaly. Electronically Signed   By: Donavan Foil M.D.   On: 08/01/2020 23:01    Scheduled Meds: . allopurinol  100 mg Oral BID  . carvedilol  12.5 mg Oral BID WC  . Chlorhexidine Gluconate Cloth  6 each Topical Q0600  . cinacalcet  30 mg Oral Q T,Th,Sa-HD  . darbepoetin (ARANESP) injection - DIALYSIS  200 mcg Intravenous Q Tue-HD  . doxercalciferol  6 mcg Intravenous Q T,Th,Sa-HD  . fentaNYL   Intravenous [id number]  . folic acid  1 mg Oral Daily  . isosorbide mononitrate  30 mg Oral Daily  . pantoprazole  40 mg Intravenous Q12H   Continuous Infusions: . sodium chloride     PRN Meds:.sodium chloride, acetaminophen **OR** acetaminophen, naloxone **AND** sodium chloride flush, polyethylene glycol   ASSESMENT:   *   Hematemesis. CLO negative erosive gastritis at 09/2017 EGD. Nonbleeding internal hemorrhoids on 09/2017 colonoscopy. Protonix 40 mg IV bid in place.  *    Blood loss anemia.  Baseline chronic anemia of his sickle cell and ESRD  disease.  Macrocytosis.   Hgb 6.2 >> 7.1.    *    Chronic Eliquis.  For A. fib.  Last dose  *    ESRD.  Hemodialysis TTS.  *    Question sickle cell crisis.  *    Chronic thrombocytopenia.  Platelets in mid 90s.  *    Congestive heart failure.  Elevated BNP.  Chest x-ray with mild cardiomegaly.  No congestion or effusion.  *     Hyponatremia at 133.   PLAN   *   Upper endoscopy at 1315 tomorrow.  *   Ordered renal diet going forward but will be n.p.o. after midnight for procedure tomorrow  Azucena Freed  08/03/2020, 12:14 PM Phone [phone]     Attending Physician Note   I have taken an interval history, reviewed the chart and examined the patient. I agree with the  Advanced Practitioner's note, impression and recommendations.   Hematemesis, anemia for EGD tomorrow   Sickle cell disease Afib holding Eliquis ESRD on HD CHF  Lucio Edward, MD [facility] [phone]

## 2020-08-04 ENCOUNTER — Encounter (HOSPITAL_COMMUNITY): Payer: Self-pay | Admitting: Family Medicine

## 2020-08-04 ENCOUNTER — Inpatient Hospital Stay (HOSPITAL_COMMUNITY): Payer: HMO | Admitting: Certified Registered"

## 2020-08-04 ENCOUNTER — Encounter (HOSPITAL_COMMUNITY)
Admission: EM | Disposition: A | Discharge status: Home / Self Care | Payer: Self-pay | Source: Home / Self Care | Attending: Internal Medicine

## 2020-08-04 DIAGNOSIS — K922 Gastrointestinal hemorrhage, unspecified: Secondary | ICD-10-CM | POA: Diagnosis not present

## 2020-08-04 DIAGNOSIS — Q402 Other specified congenital malformations of stomach: Secondary | ICD-10-CM

## 2020-08-04 DIAGNOSIS — K3189 Other diseases of stomach and duodenum: Secondary | ICD-10-CM

## 2020-08-04 HISTORY — PX: BIOPSY: SHX5522

## 2020-08-04 HISTORY — PX: ESOPHAGOGASTRODUODENOSCOPY (EGD) WITH PROPOFOL: SHX5813

## 2020-08-04 LAB — HEPATIC FUNCTION PANEL
ALT: 15 U/L (ref 0–44)
AST: 26 U/L (ref 15–41)
Albumin: 2.8 g/dL — ABNORMAL LOW (ref 3.5–5.0)
Alkaline Phosphatase: 80 U/L (ref 38–126)
Bilirubin, Direct: 0.7 mg/dL — ABNORMAL HIGH (ref 0.0–0.2)
Indirect Bilirubin: 1.6 mg/dL — ABNORMAL HIGH (ref 0.3–0.9)
Total Bilirubin: 2.3 mg/dL — ABNORMAL HIGH (ref 0.3–1.2)
Total Protein: 6.1 g/dL — ABNORMAL LOW (ref 6.5–8.1)

## 2020-08-04 LAB — CBC WITH DIFFERENTIAL/PLATELET
Abs Immature Granulocytes: 0.07 10*3/uL (ref 0.00–0.07)
Basophils Absolute: 0 10*3/uL (ref 0.0–0.1)
Basophils Relative: 1 %
Eosinophils Absolute: 0.1 10*3/uL (ref 0.0–0.5)
Eosinophils Relative: 3 %
HCT: 18.7 % — ABNORMAL LOW (ref 39.0–52.0)
Hemoglobin: 6.8 g/dL — CL (ref 13.0–17.0)
Immature Granulocytes: 2 %
Lymphocytes Relative: 16 %
Lymphs Abs: 0.7 10*3/uL (ref 0.7–4.0)
MCH: 40 pg — ABNORMAL HIGH (ref 26.0–34.0)
MCHC: 36.4 g/dL — ABNORMAL HIGH (ref 30.0–36.0)
MCV: 110 fL — ABNORMAL HIGH (ref 80.0–100.0)
Monocytes Absolute: 0.8 10*3/uL (ref 0.1–1.0)
Monocytes Relative: 20 %
Neutro Abs: 2.4 10*3/uL (ref 1.7–7.7)
Neutrophils Relative %: 58 %
Platelets: 78 10*3/uL — ABNORMAL LOW (ref 150–400)
RBC: 1.7 MIL/uL — ABNORMAL LOW (ref 4.22–5.81)
RDW: 18.1 % — ABNORMAL HIGH (ref 11.5–15.5)
WBC: 4.3 10*3/uL (ref 4.0–10.5)
nRBC: 14.7 % — ABNORMAL HIGH (ref 0.0–0.2)

## 2020-08-04 LAB — CBC
HCT: 18.9 % — ABNORMAL LOW (ref 39.0–52.0)
Hemoglobin: 6.7 g/dL — CL (ref 13.0–17.0)
MCH: 39.4 pg — ABNORMAL HIGH (ref 26.0–34.0)
MCHC: 35.4 g/dL (ref 30.0–36.0)
MCV: 111.2 fL — ABNORMAL HIGH (ref 80.0–100.0)
Platelets: 84 10*3/uL — ABNORMAL LOW (ref 150–400)
RBC: 1.7 MIL/uL — ABNORMAL LOW (ref 4.22–5.81)
RDW: 18.7 % — ABNORMAL HIGH (ref 11.5–15.5)
WBC: 3.5 10*3/uL — ABNORMAL LOW (ref 4.0–10.5)
nRBC: 19.7 % — ABNORMAL HIGH (ref 0.0–0.2)

## 2020-08-04 LAB — LACTATE DEHYDROGENASE: LDH: 266 U/L — ABNORMAL HIGH (ref 98–192)

## 2020-08-04 LAB — PREPARE RBC (CROSSMATCH)

## 2020-08-04 SURGERY — ESOPHAGOGASTRODUODENOSCOPY (EGD) WITH PROPOFOL
Anesthesia: Monitor Anesthesia Care

## 2020-08-04 MED ORDER — FENTANYL CITRATE (PF) 100 MCG/2ML IJ SOLN
50.0000 ug | INTRAMUSCULAR | Status: DC | PRN
  Administered 2020-08-04 – 2020-08-05 (×4): 50 ug via INTRAVENOUS
  Filled 2020-08-04 (×4): qty 2

## 2020-08-04 MED ORDER — PANTOPRAZOLE SODIUM 40 MG PO TBEC
40.0000 mg | DELAYED_RELEASE_TABLET | Freq: Every day | ORAL | Status: DC
  Administered 2020-08-04 – 2020-08-05 (×2): 40 mg via ORAL
  Filled 2020-08-04: qty 1

## 2020-08-04 MED ORDER — SODIUM CHLORIDE 0.9 % IV SOLN: INTRAVENOUS | Status: DC | PRN

## 2020-08-04 MED ORDER — ORAL CARE MOUTH RINSE
15.0000 mL | Freq: Two times a day (BID) | OROMUCOSAL | Status: DC
  Administered 2020-08-04 – 2020-08-05 (×2): 15 mL via OROMUCOSAL

## 2020-08-04 MED ORDER — PHENYLEPHRINE HCL (PRESSORS) 10 MG/ML IV SOLN
INTRAVENOUS | Status: DC | PRN
  Administered 2020-08-04: 80 ug via INTRAVENOUS

## 2020-08-04 MED ORDER — PROPOFOL 10 MG/ML IV BOLUS
INTRAVENOUS | Status: DC | PRN
  Administered 2020-08-04 (×2): 20 mg via INTRAVENOUS

## 2020-08-04 MED ORDER — PROPOFOL 500 MG/50ML IV EMUL
INTRAVENOUS | Status: DC | PRN
  Administered 2020-08-04: 175 ug/kg/min via INTRAVENOUS

## 2020-08-04 MED ORDER — SODIUM CHLORIDE 0.9% IV SOLUTION: Freq: Once | INTRAVENOUS | Status: DC

## 2020-08-04 SURGICAL SUPPLY — 15 items

## 2020-08-04 NOTE — Progress Notes (Signed)
Report give to HD nurse, patient transported to dialysis.

## 2020-08-04 NOTE — Transfer of Care (Signed)
Immediate Anesthesia Transfer of Care Note  Patient: Patrick [name]  Procedure(s) Performed: ESOPHAGOGASTRODUODENOSCOPY (EGD) WITH PROPOFOL (N/A ) BIOPSY  Patient Location: Endoscopy Unit  Anesthesia Type:MAC  Level of Consciousness: awake, drowsy and patient cooperative  Airway & Oxygen Therapy: Patient Spontanous Breathing and Patient connected to nasal cannula oxygen  Post-op Assessment: Report given to RN, Post -op Vital signs reviewed and stable, Patient moving all extremities X 4 and Patient able to stick tongue midline  Post vital signs: Reviewed and stable  Last Vitals:  Vitals Value Taken Time  BP 95/57 08/04/20 1219  Temp    Pulse 54 08/04/20 1220  Resp 13 08/04/20 1220  SpO2 100 % 08/04/20 1220  Vitals shown include unvalidated device data.  Last Pain:  Vitals:   08/04/20 1123  TempSrc: Oral  PainSc: 0-No pain      Patients Stated Pain Goal: 5 (41/14/64 3142)  Complications: No complications documented.

## 2020-08-04 NOTE — Progress Notes (Cosign Needed)
Subjective: Patrick [name] is a 65 year old male with a medical history significant for sickle cell disease type SS, end-stage renal disease on dialysis, atrial fibrillation on Eliquis, history of GI bleeding in the past with several gastric ulcers seen on EGD 0000000, systolic congestive heart failure, gout, and hypertension was admitted for possible GI bleed in the setting of sickle cell pain crises.  Patient underwent dialysis this a.m. keeping with his Tuesday, Thursday, Saturday schedule.  Patient received dialysis without complication. Also, patient underwent EGD with per gastroenterology.  He continues to complain of pain primarily to bilateral lower extremities.  He says the pain has improved.  He rates pain as 6/10.  He denies any headache, chest pain, dizziness, shortness of breath, urinary symptoms, nausea, vomiting, or diarrhea.  Patient says that he has not had a bowel movement.   Objective:  Vital signs in last 24 hours:  Vitals:   08/04/20 1322 08/04/20 1550 08/04/20 1600 08/04/20 1655  BP: 110/88 (!) 89/44  (!) 101/59  Pulse: 71 60  60  Resp: '12 18 16   '$ Temp: (!) 97.4 F (36.3 C) 98.2 F (36.8 C)    TempSrc: Oral Oral    SpO2: 100% 99% 100% 100%  Weight:      Height:        Intake/Output from previous day:   Intake/Output Summary (Last 24 hours) at 08/04/2020 1747 Last data filed at 08/04/2020 1700 Gross per 24 hour  Intake 1614.67 ml  Output 2000 ml  Net -385.33 ml   Physical Exam Eyes:     Pupils: Pupils are equal, round, and reactive to light.  Cardiovascular:     Rate and Rhythm: Normal rate. Rhythm irregular.     Heart sounds: Murmur heard.    Pulmonary:     Effort: Pulmonary effort is normal.     Breath sounds: Normal breath sounds.  Abdominal:     General: Abdomen is flat. Bowel sounds are normal.  Skin:    General: Skin is warm.  Neurological:     General: No focal deficit present.     Mental Status: He is alert. Mental status is at  baseline.  Psychiatric:        Mood and Affect: Mood normal.        Behavior: Behavior normal.        Thought Content: Thought content normal.        Judgment: Judgment normal.     Lab Results:  Basic Metabolic Panel:    Component Value Date/Time   NA 133 (L) 08/02/2020 0853   NA 135 05/24/2020 1119   K 4.9 08/02/2020 0853   CL 95 (L) 08/02/2020 0853   CO2 22 08/02/2020 0853   BUN 98 (H) 08/02/2020 0853   BUN 25 05/24/2020 1119   CREATININE 7.09 (H) 08/02/2020 0853   CREATININE 2.90 (H) 08/11/2015 1359   GLUCOSE 89 08/02/2020 0853   CALCIUM 8.3 (L) 08/02/2020 0853   CBC:    Component Value Date/Time   WBC 3.5 (L) 08/04/2020 0741   HGB 6.7 (LL) 08/04/2020 0741   HGB 9.1 (L) 05/24/2020 1119   HCT 18.9 (L) 08/04/2020 0741   HCT 25.6 (L) 05/24/2020 1119   PLT 84 (L) 08/04/2020 0741   PLT 88 (LL) 05/24/2020 1119   MCV 111.2 (H) 08/04/2020 0741   MCV 124 (H) 05/24/2020 1119   NEUTROABS 2.4 08/04/2020 0446   NEUTROABS 2.5 05/24/2020 1119   LYMPHSABS 0.7 08/04/2020 0446   LYMPHSABS 0.9 05/24/2020 1119  MONOABS 0.8 08/04/2020 0446   EOSABS 0.1 08/04/2020 0446   EOSABS 0.1 05/24/2020 1119   BASOSABS 0.0 08/04/2020 0446   BASOSABS 0.1 05/24/2020 1119    Recent Results (from the past 240 hour(s))  SARS CORONAVIRUS 2 (TAT 6-24 HRS) Nasopharyngeal Nasopharyngeal Swab     Status: None   Collection Time: 07/28/20 11:11 AM   Specimen: Nasopharyngeal Swab  Result Value Ref Range Status   SARS Coronavirus 2 NEGATIVE NEGATIVE Final    Comment: (NOTE) SARS-CoV-2 target nucleic acids are NOT DETECTED.  The SARS-CoV-2 RNA is generally detectable in upper and lower respiratory specimens during the acute phase of infection. Negative results do not preclude SARS-CoV-2 infection, do not rule out co-infections with other pathogens, and should not be used as the sole basis for treatment or other patient management decisions. Negative results must be combined with clinical  observations, patient history, and epidemiological information. The expected result is Negative.  Fact Sheet for Patients: SugarRoll.be  Fact Sheet for Healthcare Providers: https://www.woods-mathews.com/  This test is not yet approved or cleared by the [place] FDA and  has been authorized for detection and/or diagnosis of SARS-CoV-2 by FDA under an Emergency Use Authorization (EUA). This EUA will remain  in effect (meaning this test can be used) for the duration of the COVID-19 declaration under Se ction 564(b)(1) of the Act, 21 U.S.C. section 360bbb-3(b)(1), unless the authorization is terminated or revoked sooner.  Performed at [facility], [place] [address], [place], [place] [address]   MRSA PCR Screening     Status: None   Collection Time: 08/02/20 11:56 PM   Specimen: Nasal Mucosa; Nasopharyngeal  Result Value Ref Range Status   MRSA by PCR NEGATIVE NEGATIVE Final    Comment:        The GeneXpert MRSA Assay (FDA approved for NASAL specimens only), is one component of a comprehensive MRSA colonization surveillance program. It is not intended to diagnose MRSA infection nor to guide or monitor treatment for MRSA infections. Performed at [facility], [place] [address], [place], [place] [address]     Studies/Results: No results found.  Medications: Scheduled Meds: . sodium chloride   Intravenous Once  . allopurinol  100 mg Oral BID  . carvedilol  12.5 mg Oral BID WC  . Chlorhexidine Gluconate Cloth  6 each Topical Q0600  . cinacalcet  30 mg Oral Q T,Th,Sa-HD  . darbepoetin (ARANESP) injection - DIALYSIS  200 mcg Intravenous Q Tue-HD  . doxercalciferol  6 mcg Intravenous Q T,Th,Sa-HD  . folic acid  1 mg Oral Daily  . isosorbide mononitrate  30 mg Oral Daily  . mouth rinse  15 mL Mouth Rinse BID  . nicotine  21 mg Transdermal Daily  . pantoprazole  40 mg Oral Q0600   Continuous Infusions: . sodium chloride      PRN Meds:.sodium chloride, acetaminophen **OR** acetaminophen, fentaNYL (SUBLIMAZE) injection, oxyCODONE, polyethylene glycol  Consultants:  Gastroenterology  Nephrology  Procedures:  Dialysis    Antibiotics:  None  Assessment/Plan: Principal Problem:   Acute upper GI bleed Active Problems:   Essential hypertension   Sickle cell crisis (HCC)   Gout   CHF (congestive heart failure) (HCC)   End stage renal disease (HCC)   Permanent atrial fibrillation (HCC)   Chronic systolic CHF (congestive heart failure) (HCC)   Prolonged QT interval   GI bleed  GI bleed with acute blood loss anemia: Patient underwent EGD.  Examination of the esophagus was  mostly normal.  No active GI bleed.  Patient will resume medications.  Gastroenterology will follow-up as an outpatient. Appreciate gastroenterology's input in patient's plan of care.  Sickle cell pain crisis: Discontinue fentanyl PCA.  Fentanyl 50 mcg every 2 hours as needed for severe breakthrough pain Also, continue home medications, oxycodone 10 mg every 4 hours as needed. ry closely, reevaluate pain scale regularly, and supplemental oxygen as needed. CBC with differential and LDH in a.m.  Anemia of chronic disease: Hemoglobin stable.  Patient is s/p 1 unit PRBCs on yesterday.  Repeat CBC in a.m.  ESRD: Continue hemodialysis on Tuesday, Thursday, Saturday schedule. Patient underwent dialysis today, tolerated well.   Essential hypertension: Stable.  Continue home medications.  History of atrial fibrillation, resume Eliquis  History of CHF: Stable.  Continue home medications.  Most recent echocardiogram on 02/17/2020 EF 40 to 45%. Continue cardiac monitoring Also, continue home medications Followed as an outpatient by cardiology  Tobacco dependence: Smoking cessation instruction/counseling given:  counseled patient on the dangers of tobacco use, advised patient to stop smoking, and reviewed strategies to maximize  success  Nicotine patch   Code Status: Full Code Family Communication: N/A Disposition Plan: Not yet ready for discharge.  Possible discharge in a.m.  [name]  APRN, MSN, FNP-C [facility] [address] [place], [place] [address] [phone] If 5PM-8AM, please contact night-coverage.  08/04/2020, 5:47 PM  LOS: 2 days

## 2020-08-04 NOTE — Interval H&P Note (Signed)
History and Physical Interval Note:  08/04/2020 11:30 AM  Patrick [name]  has presented today for surgery, with the diagnosis of Upper GI bleed.  The various methods of treatment have been discussed with the patient and family. After consideration of risks, benefits and other options for treatment, the patient has consented to  Procedure(s): ESOPHAGOGASTRODUODENOSCOPY (EGD) WITH PROPOFOL (N/A) as a surgical intervention.  The patient's history has been reviewed, patient examined, no change in status, stable for surgery.  I have reviewed the patient's chart and labs.  Questions were answered to the patient's satisfaction.     Pricilla Riffle. Fuller Plan

## 2020-08-04 NOTE — Anesthesia Preprocedure Evaluation (Addendum)
Anesthesia Evaluation  Patient identified by MRN, date of birth, ID band Patient awake    Reviewed: Allergy & Precautions, NPO status , Patient's Chart, lab work & pertinent test results  Airway Mallampati: II  TM Distance: >3 FB Neck ROM: Full    Dental  (+) Dental Advisory Given, Poor Dentition, Missing   Pulmonary former smoker,    Pulmonary exam normal breath sounds clear to auscultation       Cardiovascular hypertension, Pt. on medications Normal cardiovascular exam Rhythm:Regular Rate:Normal     Neuro/Psych  Headaches, negative psych ROS   GI/Hepatic   Endo/Other    Renal/GU Renal Insufficiency, ESRF and DialysisRenal disease     Musculoskeletal   Abdominal   Peds  Hematology  (+) Blood dyscrasia, Sickle cell anemia and anemia ,   Anesthesia Other Findings   Reproductive/Obstetrics                            Anesthesia Physical  Anesthesia Plan  ASA: III  Anesthesia Plan: MAC   Post-op Pain Management:    Induction:   PONV Risk Score and Plan: 1 and Treatment may vary due to age or medical condition and Propofol infusion  Airway Management Planned: Nasal Cannula and Natural Airway  Additional Equipment:   Intra-op Plan:   Post-operative Plan:   Informed Consent: I have reviewed the patients History and Physical, chart, labs and discussed the procedure including the risks, benefits and alternatives for the proposed anesthesia with the patient or authorized representative who has indicated his/her understanding and acceptance.     Dental advisory given  Plan Discussed with: CRNA  Anesthesia Plan Comments:        Anesthesia Quick Evaluation

## 2020-08-04 NOTE — Procedures (Signed)
Patient seen on Hemodialysis. BP (!) 110/53 (BP Location: Right Arm)   Pulse 73   Temp 97.9 F (36.6 C) (Oral)   Resp 17   Ht '5\' 11"'$  (1.803 m)   Wt 65.1 kg   SpO2 100%   BMI 20.02 kg/m   QB 400, UF goal 1.5L Tolerating treatment without complaints at this time. Hgb 6.7- ordered for 1 unit of PRBCs. No melena/hematochezia overnight- EGD this afternoon.   Elmarie Shiley MD [facility]. Office # [phone] Pager # [phone] 8:50 AM

## 2020-08-04 NOTE — Plan of Care (Signed)
  Problem: Clinical Measurements: Goal: Will remain free from infection Outcome: Progressing   Problem: Clinical Measurements: Goal: Respiratory complications will improve Outcome: Progressing   

## 2020-08-04 NOTE — Op Note (Addendum)
[facility] Patient Name: Patrick [name] Procedure Date : 08/04/2020 MRN: [id number] Attending MD: Ladene Artist , MD Date of Birth: 07-16-1955 CSN: [id number] Age: 65 Admit Type: Inpatient Procedure:                Upper GI endoscopy Indications:              Acute post hemorrhagic anemia, Hematemesis Providers:                Pricilla Riffle. Fuller Plan, MD, Nelia Shi, RN Referring MD:             [facility] Medicines:                Monitored Anesthesia Care Complications:            No immediate complications. Estimated Blood Loss:     Estimated blood loss was minimal. Procedure:                Pre-Anesthesia Assessment:                           - Prior to the procedure, a History and Physical                            was performed, and patient medications and                            allergies were reviewed. The patient's tolerance of                            previous anesthesia was also reviewed. The risks                            and benefits of the procedure and the sedation                            options and risks were discussed with the patient.                            All questions were answered, and informed consent                            was obtained. Prior Anticoagulants: The patient has                            taken Eliquis (apixaban), last dose was 3 days                            prior to procedure. ASA Grade Assessment: III - A                            patient with severe systemic disease. After                            reviewing the risks and benefits, the patient was  deemed in satisfactory condition to undergo the                            procedure.                           After obtaining informed consent, the endoscope was                            passed under direct vision. Throughout the                            procedure, the patient's blood pressure, pulse, and                             oxygen saturations were monitored continuously. The                            [id number] Olympus gastroscope was                            introduced through the mouth, and advanced to the                            second part of duodenum. The upper GI endoscopy was                            accomplished without difficulty. The patient                            tolerated the procedure well. Scope In: Scope Out: Findings:      A single area of ectopic gastric mucosa was found in the proximal       esophagus.      The exam of the esophagus was otherwise normal.      A few localized small erosions with no bleeding and no stigmata of       recent bleeding were found in the prepyloric region of the stomach.       Biopsies were taken with a cold forceps for histology.      Patchy mildly erythematous mucosa without bleeding was found in the       gastric body. Biopsies were taken with a cold forceps for histology.      A small hiatal hernia was present.      The exam of the stomach was otherwise normal.      The duodenal bulb and second portion of the duodenum were normal. Impression:               - Ectopic gastric mucosa in the proximal esophagus.                           - Erosive gastropathy with no bleeding and no                            stigmata of recent bleeding. Biopsied.                           -  Erythematous mucosa in the gastric body. Biopsied.                           - Small hiatal hernia.                           - Normal duodenal bulb and second portion of the                            duodenum. Recommendation:           - Return patient to hospital ward for ongoing care.                           - Resume previous diet.                           - Continue present medications.                           - Pantoprazole 40 mg (or similar PPI) po qd long                            term.                           - Resume Eliquis (apixaban) at prior dose  tomorrow.                            Refer to managing physician for further adjustment                            of therapy.                           - Await pathology results.                           - No aspirin, ibuprofen, naproxen, or other                            non-steroidal anti-inflammatory drugs.                           - We will contact him with his biopsy results.                           - Outpatient GI follow up with Dr. Hilarie Fredrickson is not                            needed at this time.                           - Follow up with PCP as outpatient.                           - GI signing off.  Procedure Code(s):        --- Professional ---                           [phone], Esophagogastroduodenoscopy, flexible,                            transoral; with biopsy, single or multiple Diagnosis Code(s):        --- Professional ---                           Q40.2, Other specified congenital malformations of                            stomach                           K31.89, Other diseases of stomach and duodenum                           K44.9, Diaphragmatic hernia without obstruction or                            gangrene                           D62, Acute posthemorrhagic anemia                           K92.0, Hematemesis CPT copyright 2019 American Medical Association. All rights reserved. The codes documented in this report are preliminary and upon coder review may  be revised to meet current compliance requirements. Ladene Artist, MD 08/04/2020 12:21:20 PM This report has been signed electronically. Number of Addenda: 0

## 2020-08-04 NOTE — Anesthesia Postprocedure Evaluation (Signed)
Anesthesia Post Note  Patient: Patrick [name]  Procedure(s) Performed: ESOPHAGOGASTRODUODENOSCOPY (EGD) WITH PROPOFOL (N/A ) BIOPSY     Patient location during evaluation: Endoscopy Anesthesia Type: MAC Level of consciousness: awake and alert Pain management: pain level controlled Vital Signs Assessment: post-procedure vital signs reviewed and stable Respiratory status: spontaneous breathing, nonlabored ventilation, respiratory function stable and patient connected to nasal cannula oxygen Cardiovascular status: stable and blood pressure returned to baseline Postop Assessment: no apparent nausea or vomiting Anesthetic complications: no   No complications documented.  Last Vitals:  Vitals:   08/04/20 1240 08/04/20 1322  BP: 117/69 110/88  Pulse: 76 71  Resp: (!) 9 12  Temp:  (!) 36.3 C  SpO2: 100% 100%    Last Pain:  Vitals:   08/04/20 1322  TempSrc: Oral  PainSc: [place]

## 2020-08-05 ENCOUNTER — Encounter (HOSPITAL_COMMUNITY): Payer: Self-pay | Admitting: Vascular Surgery

## 2020-08-05 ENCOUNTER — Other Ambulatory Visit: Payer: Self-pay

## 2020-08-05 DIAGNOSIS — K922 Gastrointestinal hemorrhage, unspecified: Secondary | ICD-10-CM | POA: Diagnosis not present

## 2020-08-05 LAB — BASIC METABOLIC PANEL
Anion gap: 10 (ref 5–15)
BUN: 28 mg/dL — ABNORMAL HIGH (ref 8–23)
CO2: 27 mmol/L (ref 22–32)
Calcium: 7.6 mg/dL — ABNORMAL LOW (ref 8.9–10.3)
Chloride: 98 mmol/L (ref 98–111)
Creatinine, Ser: 4.46 mg/dL — ABNORMAL HIGH (ref 0.61–1.24)
GFR, Estimated: 14 mL/min — ABNORMAL LOW (ref >60)
Glucose, Bld: 105 mg/dL — ABNORMAL HIGH (ref 70–99)
Potassium: 3.7 mmol/L (ref 3.5–5.1)
Sodium: 135 mmol/L (ref 135–145)

## 2020-08-05 LAB — TYPE AND SCREEN
ABO/RH(D): AB POS
Antibody Screen: NEGATIVE
Unit division: 0

## 2020-08-05 LAB — BPAM RBC
Blood Product Expiration Date: 202203052359
ISSUE DATE / TIME: 202202170908
Unit Type and Rh: 7300

## 2020-08-05 LAB — CBC
HCT: 20.9 % — ABNORMAL LOW (ref 39.0–52.0)
Hemoglobin: 7.8 g/dL — ABNORMAL LOW (ref 13.0–17.0)
MCH: 39.8 pg — ABNORMAL HIGH (ref 26.0–34.0)
MCHC: 37.3 g/dL — ABNORMAL HIGH (ref 30.0–36.0)
MCV: 106.6 fL — ABNORMAL HIGH (ref 80.0–100.0)
Platelets: 85 10*3/uL — ABNORMAL LOW (ref 150–400)
RBC: 1.96 MIL/uL — ABNORMAL LOW (ref 4.22–5.81)
RDW: 20.7 % — ABNORMAL HIGH (ref 11.5–15.5)
WBC: 4.8 10*3/uL (ref 4.0–10.5)
nRBC: 16.9 % — ABNORMAL HIGH (ref 0.0–0.2)

## 2020-08-05 MED ORDER — APIXABAN 5 MG PO TABS
5.0000 mg | ORAL_TABLET | Freq: Two times a day (BID) | ORAL | Status: DC
  Administered 2020-08-05: 5 mg via ORAL
  Filled 2020-08-05: qty 1

## 2020-08-05 NOTE — Progress Notes (Signed)
Fentanyl pca was discontinued on 08/04/20 after patient used most of it but had a low bp. '1mg'$ /72mg of fentanyl was wasted in the stericycle witnessed by [facility] RN And GMikle Bosworth RTherapist, sports

## 2020-08-05 NOTE — Discharge Summary (Signed)
Physician Discharge Summary  Patrick [name] OAC:[id number] DOB: [birth year] DOA: 08/01/2020  PCP: Dorena Dew, FNP  Admit date: 08/01/2020  Discharge date: 08/05/2020  Discharge Diagnoses:  Principal Problem:   Acute upper GI bleed Active Problems:   Essential hypertension   Sickle cell crisis ([place])   Gout   CHF (congestive heart failure) (HCC)   End stage renal disease (HCC)   Permanent atrial fibrillation (HCC)   Chronic systolic CHF (congestive heart failure) (HCC)   Prolonged QT interval   GI bleed   Discharge Condition: Stable  Disposition:   Follow-up Information    Dorena Dew, FNP Follow up in 2 week(s).   Specialty: Family Medicine Contact information: [address] [place] [address] [phone]              Pt is discharged home in good condition and is to follow up with Dorena Dew, FNP this week to have labs evaluated. Patrick [name] is instructed to increase activity slowly and balance with rest for the next few days, and use prescribed medication to complete treatment of pain  Diet: Regular Wt Readings from Last 3 Encounters:  08/05/20 64.5 kg  07/29/20 69.4 kg  07/13/20 69.9 kg    History of present illness:  Patrick [name] is a 65 year old male with a past medical history significant for sickle cell anemia, end-stage renal disease (Tuesday, Thursday, Saturday HD schedule), atrial fibrillation on Eliquis, history of GI bleeding in the past (several gastric ulcers seen via EGD [id number]), systolic congestive heart failure, gout, and hypertension who presented to [facility] emergency department with complaints of bilateral lower extremity pain and hematemesis. Patient explained that on the afternoon of 08/01/2020 he arrived home from work and felt nauseated.  Patient continued to feel nauseous throughout the afternoon into the evening.  At approximately 6 PM patient began to experience episodes of vomiting.   Patient describes his vomitus as "dark and bloody".  Patient says that he experienced at least 4 episodes of large-volume vomitus, all appearing to be dark and bloody.  Shortly after the patient's 4 episodes of vomiting, he began to experience severe pain of bilateral thighs and legs.  Patient describes his pain as 10/10 in intensity, sharp in quality, and consistent with previous sickle cell crises in the past. Upon further questioning, patient denies abdominal pain, melena, fever, dysuria, or any alcohol use.  Of associated generalized weakness, lightheadedness, and malaise. With this constellation of symptoms, the patient presented to [facility] emergency department for further evaluation of these problems.  Emergency department course: Possible acute upper GI bleed, prior to admission, patient experiencing several bouts of hematemesis that was concerning for upper GI bleed.  At that time, home regimen of Eliquis was held.  Initiated 80 mg of IV Protonix followed by 40 mg every 12 hours. Serial CBCs were initiated to monitor hemoglobin and hematocrit.  On admission, hemoglobin was found to be 7.1.  Below patient's baseline of 8-9 g/dL.  Patient was transfused 1 unit of packed red blood cells without complication. While in the emergency department, EKG reviewed.  Rhythm is atrial fibrillation with heart rate of 86 bpm.  Notably prolonged QTC of 510 ms, no dynamic segment changes noted.  Hospital Course: Acute upper GI bleed: Patient underwent EGD on 08/04/2020: A single area of ectopic gastric mucosa was found in the proximal esophagus. Findings: The exam of the esophagus was otherwise normal. A few localized small erosions with no  bleeding and no stigmata of recent bleeding were found in the prepyloric region of the stomach. Biopsies were taken with a cold forceps for histology. Patchy mildly erythematous mucosa without bleeding was found in the gastric body. Biopsies were taken with a  cold forceps for histology. A small hiatal hernia was present. The exam of the stomach was otherwise normal. Patient stable per gastroenterology.  He will follow-up as an outpatient.  To call patient to schedule appointment. Patient will continue Protonix 40 mg twice daily Patient can resume Eliquis upon returning home.  Sickle cell anemia: Pain was managed with IV fentanyl PCA.  Fentanyl PCA was weaned throughout admission.  On 08/04/2020, patient was transition to oral medications.  He will resume oxycodone 10 mg every 4 hours as needed for severe pain upon returning home.  Pain intensity is 6/10.  Patient feels that he can manage his pain at home on medication regimen. Throughout admission, pain was managed with IV fentanyl as well as other adjunct therapies per sickle cell management protocols.  End-stage renal disease: Nephrology consulted.  Patient stable per nephrology standpoint.  Hemodialysis was continued as scheduled.  He is typically on a Tuesday, Thursday, Saturday schedule.  Patient will resume hemodialysis on tomorrow. Follow-up with outpatient nephrology as scheduled. Chronic CHF: All home medications were continued throughout admission without interruption.  Patient will resume previously prescribed medications. He is alert, oriented, and ambulating without assistance. Oxygen saturation 97% on RA.  All other vital signs are consistent with the patient's baseline.  Patient was therefore discharged home today in a hemodynamically stable condition.  Patient will follow-up with sickle cell clinic in 2 weeks with PCP.  At that time, will repeat CBC with differential and CMP.  Patient has procedure scheduled on 08/08/2020 for aneurysm excision with vascular surgeon.   Patient is aware of all upcoming appointments.  Patrick [name] will follow-up with PCP within 1 week of this discharge. Patrick [name] was counseled extensively about nonpharmacologic means of pain management, patient verbalized  understanding and was appreciative of  the care received during this admission.   We discussed the need for good hydration, monitoring of hydration status, avoidance of heat, cold, stress, and infection triggers. We discussed the need to be adherent with taking Hydrea and other home medications. Patient was reminded of the need to seek medical attention immediately if any symptom of bleeding, anemia, or infection occurs.  Discharge Exam: Vitals:   08/05/20 0715 08/05/20 0915  BP: 113/72 111/69  Pulse: 71   Resp: 16   Temp: 98.2 F (36.8 C)   SpO2: 97%    Vitals:   08/05/20 0016 08/05/20 0336 08/05/20 0715 08/05/20 0915  BP:  100/68 113/72 111/69  Pulse:  60 71   Resp:  18 16   Temp:  98.1 F (36.7 C) 98.2 F (36.8 C)   TempSrc:  Oral Oral   SpO2:  97% 97%   Weight: 64.5 kg     Height:        General appearance : Awake, alert, not in any distress. Speech Clear. Not toxic looking HEENT: Atraumatic and Normocephalic, pupils equally reactive to light and accomodation Neck: Supple, no JVD. No cervical lymphadenopathy.  Chest: Good air entry bilaterally, no added sounds  CVS: S1 S2 irregular, murmur present Abdomen: Bowel sounds present, Non tender and not distended with no gaurding, rigidity or rebound. Extremities: B/L Lower Ext shows no edema, both legs are warm to touch Neurology: Awake alert, and oriented X 3, CN II-XII intact,  Non focal Skin: No Rash  Discharge Instructions  Discharge Instructions    Discharge patient   Complete by: As directed    Discharge disposition: 01-Home or Self Care   Discharge patient date: 08/05/2020     Allergies as of 08/05/2020   No Known Allergies     Medication List    TAKE these medications   acetaminophen 500 MG tablet Commonly known as: TYLENOL Take 1,000 mg by mouth every 6 (six) hours as needed for moderate pain or headache.   albuterol 108 (90 Base) MCG/ACT inhaler Commonly known as: VENTOLIN HFA Inhale 2 puffs into the  lungs every 6 (six) hours as needed for wheezing or shortness of breath.   allopurinol 100 MG tablet Commonly known as: ZYLOPRIM Take 1 tablet (100 mg total) by mouth daily. What changed: when to take this   carvedilol 12.5 MG tablet Commonly known as: COREG TAKE 1 TABLET BY MOUTH 2 (TWO) TIMES DAILY WITH A MEAL.   diphenhydramine-acetaminophen 25-500 MG Tabs tablet Commonly known as: TYLENOL PM Take 2 tablets by mouth at bedtime as needed (sleep).   Eliquis 5 MG Tabs tablet Generic drug: apixaban TAKE 1 TABLET BY MOUTH TWICE A DAY What changed: how much to take   ferric citrate 1 GM 210 MG(Fe) tablet Commonly known as: AURYXIA Take 210 mg by mouth See admin instructions. Take 210 mg with each meal and snack   folic acid 1 MG tablet Commonly known as: FOLVITE TAKE 1 TABLET BY MOUTH EVERY DAY What changed: additional instructions   gabapentin 100 MG capsule Commonly known as: NEURONTIN Take 1 capsule (100 mg total) by mouth at bedtime. What changed:   when to take this  reasons to take this   hydrALAZINE 25 MG tablet Commonly known as: APRESOLINE Take 1 tablet (25 mg total) by mouth in the morning and at bedtime. DO NOT TAKE AM DOSES ON DIALYSIS DAYS What changed:   when to take this  additional instructions   hydroxyurea 500 MG capsule Commonly known as: HYDREA TAKE 1 CAPSULE BY MOUTH EVERY DAY WITH FOOD TO MINIMIZE GI SIDE EFFECTS What changed:   how much to take  how to take this  when to take this   isosorbide mononitrate 30 MG 24 hr tablet Commonly known as: IMDUR Take 1 tablet (30 mg total) by mouth daily. DO NOT TAKE AM ON DIALYSIS DAYS. What changed: additional instructions   lidocaine-prilocaine cream Commonly known as: EMLA Apply 1 application topically daily as needed (port access).   metolazone 5 MG tablet Commonly known as: ZAROXOLYN Take 1 tablet (5 mg total) by mouth daily as needed. What changed: reasons to take this   Oxycodone  HCl 10 MG Tabs Take 1 tablet (10 mg total) by mouth every 4 (four) hours as needed (pain).   tiZANidine 4 MG tablet Commonly known as: Zanaflex Take 0.5 tablets (2 mg total) by mouth every 8 (eight) hours as needed for muscle spasms.   torsemide 20 MG tablet Commonly known as: DEMADEX Take 80 mg by mouth daily.       The results of significant diagnostics from this hospitalization (including imaging, microbiology, ancillary and laboratory) are listed below for reference.    Significant Diagnostic Studies: PERIPHERAL VASCULAR CATHETERIZATION  Result Date: 07/29/2020 PATIENT: Patrick [name]  MRN: [id number] DOB: 05-31-1956                                              DATE OF PROCEDURE: 07/29/2020  INDICATIONS:   Patrick [name] is a 65 y.o. male who was seen in the office with pain during dialysis and difficulty accessing the fistula.  He was set up for fistulogram  PROCEDURE:   1.  Conscious sedation 2.  Ultrasound-guided access to right AV fistula 3.  Fistulogram 4.  Angioplasty of stenosis of right brachiocephalic fistula (10 x 4 Mustang balloon and 12 x 4 Atlas balloon)  SURGEON: Judeth Cornfield. Scot Dock, MD, FACS  ANESTHESIA: Local with sedation  EBL: Minimal  TECHNIQUE: The patient was brought to the peripheral vascular lab and was sedated. The period of conscious sedation was 29 minutes.  During that time period, I was present face-to-face 100% of the time.  The patient was administered 1 mg of Versed and 50 mcg of fentanyl x2. The patient's heart rate, blood pressure, and oxygen saturation were monitored by the nurse continuously during the procedure.  The fistula and right arm were prepped and draped in usual sterile fashion.  Under ultrasound guidance, after the skin was anesthetized, I cannulated the proximal fistula with a micropuncture needle and a micropuncture sheath was introduced over a wire.  By  ultrasound the vein had no clot at this level.  A real-time image was obtained and sent to the server.  A fistulogram was obtained to include the fistula from the point of cannulation to include the central veins.  There was no central venous stenosis.  Just peripheral to the aneurysm in the upper arm there was a weblike stenosis.  I elected to address this with angioplasty.  I placed a short 7 French sheath over a Bentson wire after the Bentson wire was used to traverse the lesion.  2000 of IV heparin was given.  Initially I selected a 10 mm x 4 cm Mustang balloon.  This was inflated to nominal pressure for 1 minute.  Retrograde shot was obtained and there were no problems then to find in the proximal fistula.  Follow-up films showed residual weblike stenosis.  I went back with a 12 mm x 4 cm Atlas balloon which was inflated to 9 atm and I was able to break the web.  However follow-up film showed recoiling.  Given that the patient would require revision of the fistula because of the aneurysm I did not think placing a stent was indicated.  The wire and balloon were removed.  A 4-0 Monocryl was placed around the cannulation site.  No immediate complications were noted.  FINDINGS:  1.  No central venous stenosis. 2.  Large patent left brachiocephalic fistula 3.  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  There was a weblike stenosis just peripheral to this which was ballooned but there was significant recoil.  The thrill was slightly improved but the patient will require excision of the aneurysm in this area can be excised.  CLINICAL NOTE: This patient dialyzes on Tuesdays Thursdays and Saturdays.  He needs to be scheduled for excision of an aneurysm of his left upper arm fistula on a Monday Wednesday or Friday.  Deitra Mayo, MD, FACS   DG Chest Port 1 View  Result Date: 08/01/2020 CLINICAL DATA:  Shortness of breath EXAM: PORTABLE CHEST 1 VIEW COMPARISON:   09/11/2019, CT chest 05/16/2020, 03/24/2019 FINDINGS: Mild cardiomegaly with aortic atherosclerosis. No focal opacity or pleural effusion. No pneumothorax. IMPRESSION: Mild cardiomegaly. Electronically Signed   By: Donavan Foil M.D.   On: 08/01/2020 23:01   VAS [place] UPPER EXTREMITY ARTERIAL DUPLEX  Result Date: 07/13/2020 UPPER EXTREMITY DUPLEX STUDY Indications: Patient complains of pre access.  Other Factors: Aneurysmal RUE AVF Performing Technologist: June Leap RDMS, RVT  Examination Guidelines: A complete evaluation includes B-mode imaging, spectral Doppler, color Doppler, and power Doppler as needed of all accessible portions of each vessel. Bilateral testing is considered an integral part of a complete examination. Limited examinations for reoccurring indications may be performed as noted.  Right Pre-Dialysis Findings: +-----------------------+----------+--------------------+---------+--------+ Location               PSV (cm/s)Intralum. Diam. (cm)Waveform Comments +-----------------------+----------+--------------------+---------+--------+ Brachial Antecub. fossa217       0.71                                  +-----------------------+----------+--------------------+---------+--------+ Radial Art at Wrist    29        0.31                triphasic         +-----------------------+----------+--------------------+---------+--------+ Ulnar Art at Wrist     27        0.28                biphasic          +-----------------------+----------+--------------------+---------+--------+  Left Pre-Dialysis Findings: +-----------------------+----------+--------------------+---------+--------+ Location               PSV (cm/s)Intralum. Diam. (cm)Waveform Comments +-----------------------+----------+--------------------+---------+--------+ Brachial Antecub. fossa52        0.64                triphasic         +-----------------------+----------+--------------------+---------+--------+  Radial Art at Wrist    56        0.27                triphasic         +-----------------------+----------+--------------------+---------+--------+ Ulnar Art at Wrist     62        0.30                triphasic         +-----------------------+----------+--------------------+---------+--------+  Summary:  Right: No obstruction visualized in the right upper extremity. Left: No obstruction visualized in the left upper extremity. *See table(s) above for measurements and observations. Electronically signed by Deitra Mayo MD on 07/13/2020 at 9:04:33 AM.    Final    VAS [place] UPPER EXTREMITY VEIN MAPPING  Result Date: 07/13/2020 UPPER EXTREMITY VEIN MAPPING  Indications: Pre-access. History: Aneurysmal RUE AVF.  Comparison Study: 03/18/14 Performing Technologist: June Leap RDMS, RVT  Examination Guidelines: A complete evaluation includes B-mode imaging, spectral Doppler, color Doppler, and power Doppler as needed of all accessible portions of each vessel. Bilateral testing is considered an integral part of a complete examination. Limited examinations for reoccurring indications may be performed as noted. +-----------------+-------------+----------+--------------+ Right Basilic    Diameter (cm)Depth (cm)   Findings    +-----------------+-------------+----------+--------------+ Prox upper arm                          not visualized +-----------------+-------------+----------+--------------+ Mid upper arm  not visualized +-----------------+-------------+----------+--------------+ Dist upper arm                          not visualized +-----------------+-------------+----------+--------------+ Antecubital fossa    0.47                              +-----------------+-------------+----------+--------------+ Prox forearm         0.29                              +-----------------+-------------+----------+--------------+  +-----------------+-------------+----------+--------------+ Left Cephalic    Diameter (cm)Depth (cm)   Findings    +-----------------+-------------+----------+--------------+ Shoulder             0.15                              +-----------------+-------------+----------+--------------+ Mid upper arm        0.18                              +-----------------+-------------+----------+--------------+ Dist upper arm       0.17                              +-----------------+-------------+----------+--------------+ Antecubital fossa    0.24                              +-----------------+-------------+----------+--------------+ Prox forearm         0.10                              +-----------------+-------------+----------+--------------+ Mid forearm          0.11                              +-----------------+-------------+----------+--------------+ Wrist                                   not visualized +-----------------+-------------+----------+--------------+ +-----------------+-------------+----------+---------+ Left Basilic     Diameter (cm)Depth (cm)Findings  +-----------------+-------------+----------+---------+ Prox upper arm       0.41                         +-----------------+-------------+----------+---------+ Mid upper arm        0.41                         +-----------------+-------------+----------+---------+ Dist upper arm       0.42                         +-----------------+-------------+----------+---------+ Antecubital fossa    0.29               branching +-----------------+-------------+----------+---------+ *See table(s) above for measurements and observations.  Diagnosing physician: Deitra Mayo MD Electronically signed by Deitra Mayo MD on 07/13/2020 at 9:04:44 AM.    Final     Microbiology: Recent Results (from the past 240 hour(s))  SARS CORONAVIRUS 2 (TAT 6-24 HRS) Nasopharyngeal Nasopharyngeal Swab  Status: None   Collection Time: 07/28/20 11:11 AM   Specimen: Nasopharyngeal Swab  Result Value Ref Range Status   SARS Coronavirus 2 NEGATIVE NEGATIVE Final    Comment: (NOTE) SARS-CoV-2 target nucleic acids are NOT DETECTED.  The SARS-CoV-2 RNA is generally detectable in upper and lower respiratory specimens during the acute phase of infection. Negative results do not preclude SARS-CoV-2 infection, do not rule out co-infections with other pathogens, and should not be used as the sole basis for treatment or other patient management decisions. Negative results must be combined with clinical observations, patient history, and epidemiological information. The expected result is Negative.  Fact Sheet for Patients: SugarRoll.be  Fact Sheet for Healthcare Providers: https://www.woods-mathews.com/  This test is not yet approved or cleared by the [place] FDA and  has been authorized for detection and/or diagnosis of SARS-CoV-2 by FDA under an Emergency Use Authorization (EUA). This EUA will remain  in effect (meaning this test can be used) for the duration of the COVID-19 declaration under Se ction 564(b)(1) of the Act, 21 U.S.C. section 360bbb-3(b)(1), unless the authorization is terminated or revoked sooner.  Performed at [facility], [place] [address] [place], [place] [address]   MRSA PCR Screening     Status: None   Collection Time: 08/02/20 11:56 PM   Specimen: Nasal Mucosa; Nasopharyngeal  Result Value Ref Range Status   MRSA by PCR NEGATIVE NEGATIVE Final    Comment:        The GeneXpert MRSA Assay (FDA approved for NASAL specimens only), is one component of a comprehensive MRSA colonization surveillance program. It is not intended to diagnose MRSA infection nor to guide or monitor treatment for MRSA infections. Performed at [facility], [place] [address] [place], [place] [address]      Labs: Basic  Metabolic Panel: Recent Labs  Lab 08/01/20 2047 08/02/20 0853 08/02/20 1805 08/05/20 0326  NA 131* 133*  --  135  K 4.6 4.9  --  3.7  CL 91* 95*  --  98  CO2 23 22  --  27  GLUCOSE 85 89  --  105*  BUN 89* 98*  --  28*  CREATININE 6.79* 7.09*  --  4.46*  CALCIUM 8.4* 8.3*  --  7.6*  MG  --  2.2  --   --   PHOS  --   --  5.5*  --    Liver Function Tests: Recent Labs  Lab 08/01/20 2047 08/02/20 0853 08/04/20 0317  AST _0 ALT _1 ALKPHOS 105 92 80  BILITOT 2.9* 2.6* 2.3*  PROT 7.4 6.8 6.1*  ALBUMIN 3.5 3.3* 2.8*   No results for input(s): LIPASE, AMYLASE in the last 168 hours. No results for input(s): AMMONIA in the last 168 hours. CBC: Recent Labs  Lab 08/01/20 2047 08/02/20 1529 08/02/20 1624 08/02/20 1805 08/04/20 0446 08/04/20 0741 08/05/20 0326  WBC 4.9   < > 4.9 4.8 4.3 3.5* 4.8  NEUTROABS 3.1  --   --   --  2.4  --   --   HGB 6.2*   < > 7.6* 7.1* 6.8* 6.7* 7.8*  HCT 17.2*   < > 20.8* 19.5* 18.7* 18.9* 20.9*  MCV 115.4*   < > 108.3* 106.6* 110.0* 111.2* 106.6*  PLT 99*   < > 95* 96* 78* 84* 85*   < > = values in this interval not displayed.   Cardiac Enzymes: No results for  input(s): CKTOTAL, CKMB, CKMBINDEX, TROPONINI in the last 168 hours. BNP: Invalid input(s): POCBNP CBG: No results for input(s): GLUCAP in the last 168 hours.  Time coordinating discharge: 50 minutes  Signed:  [name]  APRN, MSN, FNP-C [facility] [place], [place] [address] [phone]  [facility] 08/05/2020, 10:46 AM

## 2020-08-05 NOTE — Progress Notes (Signed)
D/C instructions given and reviewed. No questions asked but encouraged to call with any concerns. Tele and IV's removed, tolerated well. 

## 2020-08-05 NOTE — TOC Transition Note (Signed)
Transition of Care [facility]) - CM/SW Discharge Note   Patient Details  Name: Patrick [name] MRN: [id number] Date of Birth: 04-28-1956  [facility] of [facility]) CM/SW Contact:  Zenon Mayo, RN Phone Number: 08/05/2020, 11:59 AM   Clinical Narrative:    Patient is for dc today, he states he has transportation, he also uses [facility] for MD apts.  He eats a low sodium diet.  He has no issues with getting his medications.   Final next level of care: Home/Self Care Barriers to Discharge: No Barriers Identified   Patient Goals and CMS Choice Patient states their goals for this hospitalization and ongoing recovery are:: get better   Choice offered to / list presented to : NA  Discharge Placement                       Discharge Plan and Services In-house Referral: NA Discharge Planning Services: CM Consult Post Acute Care Choice: NA            DME Agency: NA       HH Arranged: NA          Social Determinants of Health (SDOH) Interventions     Readmission Risk Interventions Readmission Risk Prevention Plan 08/05/2020  Transportation Screening Complete  PCP or Specialist Appt within 3-5 Days Complete  HRI or [place] Complete  Social Work Consult for [place] Planning/Counseling Complete  Palliative Care Screening Not Applicable  Medication Review [employer]) Complete  Some recent data might be hidden

## 2020-08-05 NOTE — Discharge Instructions (Signed)
Gastrointestinal Bleeding Gastrointestinal (GI) bleeding is bleeding somewhere along the path that food travels through the body (digestive tract). This path is anywhere between the mouth and the opening of the butt (anus). You may have blood in your poop (stool) or have black poop. If you throw up (vomit), there may be blood in it. This condition can be mild, serious, or even life-threatening. If you have a lot of bleeding, you may need to stay in the hospital. What are the causes? This condition may be caused by:  Irritation and swelling of the esophagus (esophagitis). The esophagus is part of the body that moves food from your mouth to your stomach.  Swollen veins in the butt (hemorrhoids).  Areas of painful tearing in the opening of the butt (anal fissures). These are often caused by passing hard poop.  Pouches that form on the colon over time (diverticulosis).  Irritation and swelling (diverticulitis) in areas where pouches have formed on the colon.  Growths (polyps) or cancer. Colon cancer often starts out as growths that are not cancer.  Irritation of the stomach lining (gastritis).  Sores (ulcers) in the stomach. What increases the risk? You are more likely to develop this condition if you:  Have a certain type of infection in your stomach (Helicobacter pylori infection).  Take certain medicines.  Smoke.  Drink alcohol. What are the signs or symptoms? Common symptoms of this condition include:  Throwing up (vomiting) material that has bright red blood in it. It may look like coffee grounds.  Changes in your poop. The poop may: ? Have red blood in it. ? Be black, look like tar, and smell stronger than normal. ? Be red.  Pain or cramping in the belly (abdomen). How is this treated? Treatment for this condition depends on the cause of the bleeding. For example:  Sometimes, the bleeding can be stopped during a procedure that is done to find the problem (endoscopy or  colonoscopy).  Medicines can be used to: ? Help control irritation, swelling, or infection. ? Reduce acid in your stomach.  Certain problems can be treated with: ? Creams. ? Medicines that are put in the butt (suppositories). ? Warm baths.  Surgery is sometimes needed.  If you lose a lot of blood, you may need a blood transfusion. If bleeding is mild, you may be allowed to go home. If there is a lot of bleeding, you will need to stay in the hospital. Follow these instructions at home:  Take over-the-counter and prescription medicines only as told by your doctor.  Eat foods that have a lot of fiber in them. These foods include beans, whole grains, and fresh fruits and vegetables. You can also try eating 1-3 prunes each day.  Drink enough fluid to keep your pee (urine) pale yellow.  Keep all follow-up visits as told by your doctor. This is important.   Contact a doctor if:  Your symptoms do not get better. Get help right away if:  Your bleeding does not stop.  You feel dizzy or you pass out (faint).  You feel weak.  You have very bad cramps in your back or belly.  You pass large clumps of blood (clots) in your poop.  Your symptoms are getting worse.  You have chest pain or fast heartbeats. Summary  GI bleeding is bleeding somewhere along the path that food travels through the body (digestive tract).  This bleeding can be caused by many things. Treatment depends on the cause of the bleeding.    Take medicines only as told by your doctor.  Keep all follow-up visits as told by your doctor. This is important. This information is not intended to replace advice given to you by your health care provider. Make sure you discuss any questions you have with your health care provider. Document Revised: 01/15/2018 Document Reviewed: 01/15/2018 Elsevier Patient Education  2021 [place].  Sickle Cell Anemia, Adult  Sickle cell anemia is a condition where your red blood  cells are shaped like sickles. Red blood cells carry oxygen through the body. Sickle-shaped cells do not live as long as normal red blood cells. They also clump together and block blood from flowing through the blood vessels. This prevents the body from getting enough oxygen. Sickle cell anemia causes organ damage and pain. It also increases the risk of infection. Follow these instructions at home: Medicines  Take over-the-counter and prescription medicines only as told by your doctor.  If you were prescribed an antibiotic medicine, take it as told by your doctor. Do not stop taking the antibiotic even if you start to feel better.  If you develop a fever, do not take medicines to lower the fever right away. Tell your doctor about the fever. Managing pain, stiffness, and swelling  Try these methods to help with pain: ? Use a heating pad. ? Take a warm bath. ? Distract yourself, such as by watching TV. Eating and drinking  Drink enough fluid to keep your pee (urine) clear or pale yellow. Drink more in hot weather and during exercise.  Limit or avoid alcohol.  Eat a healthy diet. Eat plenty of fruits, vegetables, whole grains, and lean protein.  Take vitamins and supplements as told by your doctor. Traveling  When traveling, keep these with you: ? Your medical information. ? The names of your doctors. ? Your medicines.  If you need to take an airplane, talk to your doctor first. Activity  Rest often.  Avoid exercises that make your heart beat much faster, such as jogging. General instructions  Do not use products that have nicotine or tobacco, such as cigarettes and e-cigarettes. If you need help quitting, ask your doctor.  Consider wearing a medical alert bracelet.  Avoid being in high places (high altitudes), such as mountains.  Avoid very hot or cold temperatures.  Avoid places where the temperature changes a lot.  Keep all follow-up visits as told by your doctor.  This is important. Contact a doctor if:  A joint hurts.  Your feet or hands hurt or swell.  You feel tired (fatigued). Get help right away if:  You have symptoms of infection. These include: ? Fever. ? Chills. ? Being very tired. ? Irritability. ? Poor eating. ? Throwing up (vomiting).  You feel dizzy or faint.  You have new stomach pain, especially on the left side.  You have a an erection (priapism) that lasts more than 4 hours.  You have numbness in your arms or legs.  You have a hard time moving your arms or legs.  You have trouble talking.  You have pain that does not go away when you take medicine.  You are short of breath.  You are breathing fast.  You have a long-term cough.  You have pain in your chest.  You have a bad headache.  You have a stiff neck.  Your stomach looks bloated even though you did not eat much.  Your skin is pale.  You suddenly cannot see well. Summary  Sickle cell  anemia is a condition where your red blood cells are shaped like sickles.  Follow your doctor's advice on ways to manage pain, food to eat, activities to do, and steps to take for safe travel.  Get medical help right away if you have any signs of infection, such as a fever. This information is not intended to replace advice given to you by your health care provider. Make sure you discuss any questions you have with your health care provider. Document Revised: 10/29/2019 Document Reviewed: 10/29/2019 Elsevier Patient Education  [place].

## 2020-08-05 NOTE — Plan of Care (Signed)
  Problem: Education: Goal: Knowledge of General Education information will improve Description: Including pain rating scale, medication(s)/side effects and non-pharmacologic comfort measures Outcome: Progressing   Problem: Health Behavior/Discharge Planning: Goal: Ability to manage health-related needs will improve Outcome: Progressing   Problem: Activity: Goal: Risk for activity intolerance will decrease Outcome: Progressing   Problem: Elimination: Goal: Will not experience complications related to bowel motility Outcome: Progressing   Problem: Pain Managment: Goal: General experience of comfort will improve Outcome: Progressing   

## 2020-08-05 NOTE — Care Management Important Message (Signed)
Important Message  Patient Details  Name: Patrick [name] MRN: [id number] Date of Birth: May 06, 1956   Medicare Important Message Given:  Yes     [name] 08/05/2020, 8:43 AM

## 2020-08-05 NOTE — Plan of Care (Signed)
  Problem: Education: Goal: Knowledge of General Education information will improve Description: Including pain rating scale, medication(s)/side effects and non-pharmacologic comfort measures Outcome: Adequate for Discharge   

## 2020-08-05 NOTE — Progress Notes (Signed)
PCP - Dr Doreene Burke Cardiologist - Dr Glori Bickers Internal Med - Cammie Sickle, FNP  Chest x-ray - 08/01/20 (1V) EKG - 08/01/20 Stress Test - 06/06/18 ECHO - 02/17/20 Cardiac Cath - n/a  Anesthesia: Dr Gifford Shave saw pt on 08/04/20 for EGD procedure  Blood Thinner Instructions:   Hold eliquis three days prior to procedure.  Last dose one 08/05/20    STOP now taking any Aspirin (unless otherwise instructed by your surgeon), Aleve, Naproxen, Ibuprofen, Motrin, Advil, Goody's, BC's, all herbal medications, fish oil, and all vitamins.   Coronavirus Screening Covid test on Sat. 08/06/20 Do you have any of the following symptoms:  Cough yes/no: No Fever (>100.80F)  yes/no: No Runny nose yes/no: No Sore throat yes/no: No Difficulty breathing/shortness of breath  yes/no: No  Have you traveled in the last 14 days and where? yes/no: No  Patient verbalized understanding of instructions that were given via phone.

## 2020-08-05 NOTE — Progress Notes (Signed)
Patient ID: Patrick [name], male   DOB: 07-09-1955, 65 y.o.   MRN: [id number] [facility] Progress Note   Assessment/ Plan:   1.  GI bleed with acute blood loss anemia: Admitted with hematemesis and Hemoccult positive stool on Eliquis.  EGD yesterday showed some ectopic gastric mucosa in the distal esophagus and evidence of erosive gastropathy with no bleeding.  Recommendations noted to restart Eliquis. 2. ESRD: Continue hemodialysis on TTS schedule-underwent dialysis yesterday uneventfully with 1 unit of PRBC given during dialysis due to earlier low hemoglobin/hematocrit.  He has been hemodynamically stable overnight and appears stable from a renal standpoint to discharge home. 3.  Sickle cell crisis: With pain crisis on admission-ongoing pain management and status post PRBC transfusion again with dialysis yesterday. He had some breakthrough pain with prolonged standing-await evaluation by the primary service for possible discharge. 4. CKD-MBD: Calcium level within acceptable range, phosphorus level borderline elevated-continue Hectorol and Sensipar for PTH control 5. Nutrition: Currently n.p.o. for EGD: Resume renal diet/fluid restriction when able to eat. 6. Hypertension: Blood pressure within acceptable range, continue to follow with ongoing management/HD.  Subjective:   Had some lower extremity pain with prolonged standing earlier this morning. Denies any chest pain or shortness of breath and excited about the possibility of going home.   Objective:   BP 113/72 (BP Location: Left Arm)   Pulse 71   Temp 98.2 F (36.8 C) (Oral)   Resp 16   Ht '5\' 11"'$  (1.803 m)   Wt 64.5 kg Comment: scale b  SpO2 97%   BMI 19.83 kg/m   Physical Exam: Gen: Comfortably resting in bed, not in any distress. CVS: Pulse regular rhythm and normal rate, S1 and S2 with ejection systolic murmur Resp: Clear to auscultation bilaterally, no distinct rales or rhonchi Abd: Soft, flat, nontender,  bowel sounds normal Ext: No lower extremity edema.  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  Labs: BMET Recent Labs  Lab 08/01/20 2047 08/02/20 0853 08/02/20 1805 08/05/20 0326  NA 131* 133*  --  135  K 4.6 4.9  --  3.7  CL 91* 95*  --  98  CO2 23 22  --  27  GLUCOSE 85 89  --  105*  BUN 89* 98*  --  28*  CREATININE 6.79* 7.09*  --  4.46*  CALCIUM 8.4* 8.3*  --  7.6*  PHOS  --   --  5.5*  --    CBC Recent Labs  Lab 08/01/20 2047 08/02/20 1529 08/02/20 1805 08/04/20 0446 08/04/20 0741 08/05/20 0326  WBC 4.9   < > 4.8 4.3 3.5* 4.8  NEUTROABS 3.1  --   --  2.4  --   --   HGB 6.2*   < > 7.1* 6.8* 6.7* 7.8*  HCT 17.2*   < > 19.5* 18.7* 18.9* 20.9*  MCV 115.4*   < > 106.6* 110.0* 111.2* 106.6*  PLT 99*   < > 96* 78* 84* 85*   < > = values in this interval not displayed.     Medications:    . sodium chloride   Intravenous Once  . allopurinol  100 mg Oral BID  . carvedilol  12.5 mg Oral BID WC  . Chlorhexidine Gluconate Cloth  6 each Topical Q0600  . cinacalcet  30 mg Oral Q T,Th,Sa-HD  . darbepoetin (ARANESP) injection - DIALYSIS  200 mcg Intravenous Q Tue-HD  . doxercalciferol  6 mcg Intravenous Q T,Th,Sa-HD  . folic acid  1 mg Oral Daily  . isosorbide mononitrate  30 mg Oral Daily  . mouth rinse  15 mL Mouth Rinse BID  . nicotine  21 mg Transdermal Daily  . pantoprazole  40 mg Oral Q0600   Elmarie Shiley, MD 08/05/2020, 9:08 AM

## 2020-08-05 NOTE — TOC Initial Note (Signed)
Transition of Care [facility]) - Initial/Assessment Note    Patient Details  Name: Patrick [name] MRN: [id number] Date of Birth: February 10, 1956  [facility]) CM/SW Contact:    Zenon Mayo, RN Phone Number: 08/05/2020, 11:57 AM  Clinical Narrative:                 Patient is for dc today, he states he has transportation, he also uses [facility] for MD apts.  He eats a low sodium diet.  He has no issues with getting his medications.    Expected Discharge Plan: Home/Self Care Barriers to Discharge: No Barriers Identified   Patient Goals and CMS Choice Patient states their goals for this hospitalization and ongoing recovery are:: get better   Choice offered to / list presented to : NA  Expected Discharge Plan and Services Expected Discharge Plan: Home/Self Care In-house Referral: NA Discharge Planning Services: CM Consult Post Acute Care Choice: NA Living arrangements for the past 2 months: Single Family Home Expected Discharge Date: 08/05/20                 DME Agency: NA       HH Arranged: NA          Prior Living Arrangements/Services Living arrangements for the past 2 months: Single Family Home Lives with:: Self Patient language and need for interpreter reviewed:: Yes Do you feel safe going back to the place where you live?: Yes      Need for Family Participation in Patient Care: Yes (Comment) Care giver support system in place?: Yes (comment)      Activities of Daily Living Home Assistive Devices/Equipment: None ADL Screening (condition at time of admission) Patient's cognitive ability adequate to safely complete daily activities?: Yes Is the patient deaf or have difficulty hearing?: No Does the patient have difficulty seeing, even when wearing glasses/contacts?: No Does the patient have difficulty concentrating, remembering, or making decisions?: No Patient able to express need for assistance with ADLs?: Yes Does the patient have difficulty  dressing or bathing?: No Independently performs ADLs?: Yes (appropriate for developmental age) Does the patient have difficulty walking or climbing stairs?: Yes (secondary to bilateral lower extremity pain) Weakness of Legs: Both Weakness of Arms/Hands: None  Permission Sought/Granted                  Emotional Assessment Appearance:: Appears stated age Attitude/Demeanor/Rapport: Engaged Affect (typically observed): Appropriate Orientation: : Oriented to Self,Oriented to Place,Oriented to  Time,Oriented to Situation Alcohol / Substance Use: Not Applicable Psych Involvement: No (comment)  Admission diagnosis:  Sickle cell crisis ([place]) [D57.00] GI bleed [K92.2] Upper GI bleed [K92.2] Acute upper GI bleed [K92.2] Symptomatic anemia [D64.9] Patient Active Problem List   Diagnosis Date Noted  . Acute upper GI bleed 08/02/2020  . Prolonged QT interval 08/02/2020  . GI bleed 08/02/2020  . Hematemesis with nausea   . Chronic anticoagulation   . Tobacco dependence 05/28/2020  . Muscle spasm of back 01/29/2020  . Chronic systolic CHF (congestive heart failure) ([place]) 09/11/2019  . Hypoxia 06/02/2018  . Symptomatic anemia   . Chronic heart failure with preserved ejection fraction ([place])   . Colon cancer screening 07/16/2017  . Encounter for therapeutic drug monitoring 12/29/2015  . 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  . Permanent atrial fibrillation ([place]) 12/24/2015  . Cough with sputum 10/25/2015  . Muscle tightness-Right arm  05/27/2014  . Arm pain, right 05/06/2014  . End stage renal disease ([place]) 05/06/2014  .  Chest pain 01/13/2014  . Vitamin D deficiency 09/25/2013  . CKD (chronic kidney disease), stage IV ([place]) 09/25/2013  . Elevated uric acid in blood 09/25/2013  . 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 Anemia 09/25/2013  . Onychomycosis of toenail 08/27/2013  . Cellulitis 08/27/2013  . Hyperglycemia 08/27/2013  . CHF (congestive heart failure) ([place]) 08/19/2013   . Sickle cell anemia ([place]) 11/21/2012  . NSVT (nonsustained ventricular tachycardia) ([place]) 10/21/2011  . Gout 10/19/2011  . Sickle cell crisis ([place]) 10/17/2011  . Acute on chronic systolic and diastolic heart failure, NYHA class 4 ([place]) 10/17/2011  . Essential hypertension 06/07/2011   PCP:  Dorena Dew, FNP Pharmacy:   [id number] [place] [address]Phone: [phone]: [phone]    Social Determinants of Health (SDOH) Interventions    Readmission Risk Interventions Readmission Risk Prevention Plan 08/05/2020  Transportation Screening Complete  PCP or Specialist Appt within 3-5 Days Complete  [place]  Social Work Consult for [place]Counsel[place] Complete  Palliative Care Screening Not Applicable  Medication Review (Press photographer Complete  Some recent data might be hidden

## 2020-08-06 ENCOUNTER — Other Ambulatory Visit (HOSPITAL_COMMUNITY)
Admission: RE | Admit: 2020-08-06 | Discharge: 2020-08-06 | Disposition: A | Discharge status: Home / Self Care | Payer: HMO | Source: Ambulatory Visit | Attending: Vascular Surgery | Admitting: Vascular Surgery

## 2020-08-06 ENCOUNTER — Telehealth: Payer: Self-pay | Admitting: Nephrology

## 2020-08-06 DIAGNOSIS — Z01812 Encounter for preprocedural laboratory examination: Secondary | ICD-10-CM | POA: Diagnosis not present

## 2020-08-06 DIAGNOSIS — Z20822 Contact with and (suspected) exposure to covid-19: Secondary | ICD-10-CM | POA: Diagnosis not present

## 2020-08-06 LAB — SARS CORONAVIRUS 2 (TAT 6-24 HRS): SARS Coronavirus 2: NEGATIVE

## 2020-08-06 NOTE — Telephone Encounter (Signed)
Transition of care contact from inpatient facility  Date of discharge: 2/18/220 Date of contact: 2/19/220 Method: Phone Spoke to: Patient  Patient contacted to discuss transition of care from recent inpatient hospitalization. Patient was admitted to [facility] from 2/14-2/18/22 with discharge diagnosis of Acute GIB/Sickle cell crisis.   Medication changes were reviewed.   Patient will follow up with his/her outpatient HD unit on: He went to his scheduled dialysis today 2/19. No other concerns today.

## 2020-08-07 ENCOUNTER — Encounter (HOSPITAL_COMMUNITY): Payer: Self-pay | Admitting: Gastroenterology

## 2020-08-07 NOTE — Anesthesia Preprocedure Evaluation (Addendum)
Anesthesia Evaluation  Patient identified by MRN, date of birth, ID band Patient awake    Reviewed: Allergy & Precautions, NPO status , Patient's Chart, lab work & pertinent test results, reviewed documented beta blocker date and time   History of Anesthesia Complications Negative for: history of anesthetic complications  Airway Mallampati: II  TM Distance: >3 FB Neck ROM: Full    Dental  (+) Dental Advisory Given, Poor Dentition, Missing   Pulmonary former smoker,    Pulmonary exam normal breath sounds clear to auscultation       Cardiovascular hypertension, Pt. on home beta blockers and Pt. on medications +CHF  Normal cardiovascular exam Rhythm:Regular Rate:Normal  Echo 02/17/20: 1. Left ventricular ejection fraction, by estimation, is 40 to 45%. The  left ventricle has mildly decreased function. The left ventricle  demonstrates global hypokinesis. The left ventricular internal cavity size  was moderately dilated. There is moderate  left ventricular hypertrophy. Left ventricular diastolic parameters are  indeterminate.  2. Right ventricular systolic function is mildly reduced. The right  ventricular size is mildly enlarged. There is severely elevated pulmonary  artery systolic pressure. The estimated right ventricular systolic  pressure is 0000000 mmHg.  3. Left atrial size was severely dilated.  4. Right atrial size was severely dilated.  5. The mitral valve is grossly normal. Moderate to severe mitral valve  regurgitation.  6. Tricuspid valve regurgitation is severe.  7. The aortic valve is tricuspid. Aortic valve regurgitation is trivial.  No aortic stenosis is present.  8. The inferior vena cava is dilated in size with <50% respiratory  variability, suggesting right atrial pressure of 15 mmHg.    Neuro/Psych  Headaches, negative psych ROS   GI/Hepatic negative GI ROS, Neg liver ROS,   Endo/Other  negative  endocrine ROS  Renal/GU ESRF and DialysisRenal disease     Musculoskeletal negative musculoskeletal ROS (+)   Abdominal   Peds  Hematology  (+) Blood dyscrasia (Eliquis; Thrombocytopenia), Sickle cell anemia and anemia ,   Anesthesia Other Findings   Reproductive/Obstetrics                            Anesthesia Physical Anesthesia Plan  ASA: IV  Anesthesia Plan: General   Post-op Pain Management:    Induction: Intravenous  PONV Risk Score and Plan: 2 and Dexamethasone, Ondansetron and Midazolam  Airway Management Planned: LMA  Additional Equipment:   Intra-op Plan:   Post-operative Plan: Extubation in OR  Informed Consent: I have reviewed the patients History and Physical, chart, labs and discussed the procedure including the risks, benefits and alternatives for the proposed anesthesia with the patient or authorized representative who has indicated his/her understanding and acceptance.     Dental advisory given  Plan Discussed with: CRNA  Anesthesia Plan Comments:        Anesthesia Quick Evaluation

## 2020-08-08 ENCOUNTER — Encounter (HOSPITAL_COMMUNITY)
Admission: RE | Disposition: A | Discharge status: Home / Self Care | Payer: Self-pay | Source: Home / Self Care | Attending: Vascular Surgery

## 2020-08-08 ENCOUNTER — Other Ambulatory Visit: Payer: Self-pay

## 2020-08-08 ENCOUNTER — Ambulatory Visit (HOSPITAL_COMMUNITY): Payer: HMO | Admitting: Anesthesiology

## 2020-08-08 ENCOUNTER — Encounter (HOSPITAL_COMMUNITY): Payer: Self-pay | Admitting: Vascular Surgery

## 2020-08-08 ENCOUNTER — Ambulatory Visit (HOSPITAL_COMMUNITY)
Admission: RE | Admit: 2020-08-08 | Discharge: 2020-08-08 | Disposition: A | Discharge status: Home / Self Care | Payer: HMO | Attending: Vascular Surgery | Admitting: Vascular Surgery

## 2020-08-08 ENCOUNTER — Encounter: Payer: Self-pay | Admitting: Gastroenterology

## 2020-08-08 DIAGNOSIS — Y832 Surgical operation with anastomosis, bypass or graft as the cause of abnormal reaction of the patient, or of later complication, without mention of misadventure at the time of the procedure: Secondary | ICD-10-CM | POA: Insufficient documentation

## 2020-08-08 DIAGNOSIS — I10 Essential (primary) hypertension: Secondary | ICD-10-CM | POA: Diagnosis not present

## 2020-08-08 DIAGNOSIS — Z79899 Other long term (current) drug therapy: Secondary | ICD-10-CM | POA: Insufficient documentation

## 2020-08-08 DIAGNOSIS — T82510A Breakdown (mechanical) of surgically created arteriovenous fistula, initial encounter: Secondary | ICD-10-CM | POA: Insufficient documentation

## 2020-08-08 DIAGNOSIS — Z7901 Long term (current) use of anticoagulants: Secondary | ICD-10-CM | POA: Insufficient documentation

## 2020-08-08 DIAGNOSIS — Z992 Dependence on renal dialysis: Secondary | ICD-10-CM | POA: Insufficient documentation

## 2020-08-08 DIAGNOSIS — Z87891 Personal history of nicotine dependence: Secondary | ICD-10-CM | POA: Diagnosis not present

## 2020-08-08 DIAGNOSIS — I5043 Acute on chronic combined systolic (congestive) and diastolic (congestive) heart failure: Secondary | ICD-10-CM | POA: Diagnosis not present

## 2020-08-08 DIAGNOSIS — N186 End stage renal disease: Secondary | ICD-10-CM | POA: Diagnosis not present

## 2020-08-08 DIAGNOSIS — I132 Hypertensive heart and chronic kidney disease with heart failure and with stage 5 chronic kidney disease, or end stage renal disease: Secondary | ICD-10-CM | POA: Diagnosis not present

## 2020-08-08 DIAGNOSIS — D631 Anemia in chronic kidney disease: Secondary | ICD-10-CM | POA: Diagnosis not present

## 2020-08-08 DIAGNOSIS — T82898A Other specified complication of vascular prosthetic devices, implants and grafts, initial encounter: Secondary | ICD-10-CM | POA: Diagnosis not present

## 2020-08-08 HISTORY — PX: REVISON OF ARTERIOVENOUS FISTULA: SHX6074

## 2020-08-08 LAB — POCT I-STAT, CHEM 8
BUN: 43 mg/dL — ABNORMAL HIGH (ref 8–23)
Calcium, Ion: 1.01 mmol/L — ABNORMAL LOW (ref 1.15–1.40)
Chloride: 96 mmol/L — ABNORMAL LOW (ref 98–111)
Creatinine, Ser: 6.4 mg/dL — ABNORMAL HIGH (ref 0.61–1.24)
Glucose, Bld: 89 mg/dL (ref 70–99)
HCT: 28 % — ABNORMAL LOW (ref 39.0–52.0)
Hemoglobin: 9.5 g/dL — ABNORMAL LOW (ref 13.0–17.0)
Potassium: 4.1 mmol/L (ref 3.5–5.1)
Sodium: 138 mmol/L (ref 135–145)
TCO2: 30 mmol/L (ref 22–32)

## 2020-08-08 LAB — SURGICAL PATHOLOGY

## 2020-08-08 SURGERY — REVISON OF ARTERIOVENOUS FISTULA
Anesthesia: General | Site: Arm Upper | Laterality: Right

## 2020-08-08 MED ORDER — CHLORHEXIDINE GLUCONATE 0.12 % MT SOLN: 15.0000 mL | Freq: Once | OROMUCOSAL | Status: AC

## 2020-08-08 MED ORDER — ORAL CARE MOUTH RINSE: 15.0000 mL | Freq: Once | OROMUCOSAL | Status: AC

## 2020-08-08 MED ORDER — SODIUM CHLORIDE 0.9 % IV SOLN: INTRAVENOUS | Status: DC

## 2020-08-08 MED ORDER — FENTANYL CITRATE (PF) 100 MCG/2ML IJ SOLN
INTRAMUSCULAR | Status: DC | PRN
  Administered 2020-08-08 (×2): 50 ug via INTRAVENOUS
  Administered 2020-08-08: 25 ug via INTRAVENOUS

## 2020-08-08 MED ORDER — CARVEDILOL 12.5 MG PO TABS
ORAL_TABLET | ORAL | Status: AC
  Administered 2020-08-08: 12.5 mg via ORAL
  Filled 2020-08-08: qty 1

## 2020-08-08 MED ORDER — LIDOCAINE HCL (PF) 1 % IJ SOLN
INTRAMUSCULAR | Status: AC
  Filled 2020-08-08: qty 30

## 2020-08-08 MED ORDER — ONDANSETRON HCL 4 MG/2ML IJ SOLN: 4.0000 mg | Freq: Once | INTRAMUSCULAR | Status: DC | PRN

## 2020-08-08 MED ORDER — PROPOFOL 10 MG/ML IV BOLUS
INTRAVENOUS | Status: AC
  Filled 2020-08-08: qty 20

## 2020-08-08 MED ORDER — FENTANYL CITRATE (PF) 100 MCG/2ML IJ SOLN
25.0000 ug | INTRAMUSCULAR | Status: DC | PRN
  Administered 2020-08-08 (×2): 50 ug via INTRAVENOUS

## 2020-08-08 MED ORDER — CARVEDILOL 12.5 MG PO TABS: 12.5000 mg | ORAL_TABLET | Freq: Two times a day (BID) | ORAL | Status: AC

## 2020-08-08 MED ORDER — OXYCODONE HCL 5 MG PO TABS: 5.0000 mg | ORAL_TABLET | ORAL | 0 refills | Status: DC | PRN

## 2020-08-08 MED ORDER — HEPARIN SODIUM (PORCINE) 1000 UNIT/ML IJ SOLN
INTRAMUSCULAR | Status: DC | PRN
  Administered 2020-08-08: 6000 [IU] via INTRAVENOUS

## 2020-08-08 MED ORDER — LIDOCAINE HCL (CARDIAC) PF 100 MG/5ML IV SOSY: PREFILLED_SYRINGE | INTRAVENOUS | Status: DC | PRN

## 2020-08-08 MED ORDER — SODIUM CHLORIDE 0.9 % IV SOLN
INTRAVENOUS | Status: AC
  Filled 2020-08-08: qty 1.2

## 2020-08-08 MED ORDER — ONDANSETRON HCL 4 MG/2ML IJ SOLN
INTRAMUSCULAR | Status: DC | PRN
  Administered 2020-08-08: 4 mg via INTRAVENOUS

## 2020-08-08 MED ORDER — CEFAZOLIN SODIUM-DEXTROSE 2-4 GM/100ML-% IV SOLN
INTRAVENOUS | Status: AC
  Filled 2020-08-08: qty 100

## 2020-08-08 MED ORDER — FENTANYL CITRATE (PF) 250 MCG/5ML IJ SOLN
INTRAMUSCULAR | Status: AC
  Filled 2020-08-08: qty 5

## 2020-08-08 MED ORDER — SODIUM CHLORIDE 0.9 % IV SOLN
INTRAVENOUS | Status: DC | PRN
  Administered 2020-08-08: 500 mL

## 2020-08-08 MED ORDER — THROMBIN (RECOMBINANT) 20000 UNITS EX SOLR
CUTANEOUS | Status: AC
  Filled 2020-08-08: qty 20000

## 2020-08-08 MED ORDER — LIDOCAINE 2% (20 MG/ML) 5 ML SYRINGE
INTRAMUSCULAR | Status: DC | PRN
  Administered 2020-08-08: 80 mg via INTRAVENOUS

## 2020-08-08 MED ORDER — 0.9 % SODIUM CHLORIDE (POUR BTL) OPTIME
TOPICAL | Status: DC | PRN
  Administered 2020-08-08: 1000 mL

## 2020-08-08 MED ORDER — PROTAMINE SULFATE 10 MG/ML IV SOLN
INTRAVENOUS | Status: DC | PRN
  Administered 2020-08-08: 40 mg via INTRAVENOUS

## 2020-08-08 MED ORDER — PROPOFOL 10 MG/ML IV BOLUS
INTRAVENOUS | Status: DC | PRN
  Administered 2020-08-08: 100 mg via INTRAVENOUS
  Administered 2020-08-08: 40 mg via INTRAVENOUS

## 2020-08-08 MED ORDER — FENTANYL CITRATE (PF) 100 MCG/2ML IJ SOLN
INTRAMUSCULAR | Status: AC
  Filled 2020-08-08: qty 2

## 2020-08-08 MED ORDER — CEFAZOLIN SODIUM-DEXTROSE 2-4 GM/100ML-% IV SOLN
2.0000 g | Freq: Once | INTRAVENOUS | Status: AC
  Administered 2020-08-08: 2 g via INTRAVENOUS

## 2020-08-08 MED ORDER — CHLORHEXIDINE GLUCONATE 0.12 % MT SOLN
OROMUCOSAL | Status: AC
  Administered 2020-08-08: 15 mL via OROMUCOSAL
  Filled 2020-08-08: qty 15

## 2020-08-08 SURGICAL SUPPLY — 31 items
ARMBAND PINK RESTRICT EXTREMIT (MISCELLANEOUS) ×3 IMPLANT
CANISTER SUCT 3000ML PPV (MISCELLANEOUS) ×3 IMPLANT
CANNULA VESSEL 3MM 2 BLNT TIP (CANNULA) ×3 IMPLANT
CLIP VESOCCLUDE MED 6/CT (CLIP) ×3 IMPLANT
CLIP VESOCCLUDE SM WIDE 6/CT (CLIP) ×3 IMPLANT
COVER PROBE W GEL 5X96 (DRAPES) IMPLANT
COVER WAND RF STERILE (DRAPES) ×3 IMPLANT
DERMABOND ADVANCED (GAUZE/BANDAGES/DRESSINGS) ×2
DERMABOND ADVANCED .7 DNX12 (GAUZE/BANDAGES/DRESSINGS) ×1 IMPLANT
ELECT REM PT RETURN 9FT ADLT (ELECTROSURGICAL) ×3
ELECTRODE REM PT RTRN 9FT ADLT (ELECTROSURGICAL) ×1 IMPLANT
GLOVE SRG 8 PF TXTR STRL LF DI (GLOVE) ×1 IMPLANT
GLOVE SURG UNDER POLY LF SZ6.5 (GLOVE) ×3 IMPLANT
GLOVE SURG UNDER POLY LF SZ7 (GLOVE) ×3 IMPLANT
GLOVE SURG UNDER POLY LF SZ8 (GLOVE) ×3
GOWN STRL REUS W/ TWL LRG LVL3 (GOWN DISPOSABLE) ×3 IMPLANT
GOWN STRL REUS W/TWL LRG LVL3 (GOWN DISPOSABLE) ×9
KIT BASIN OR (CUSTOM PROCEDURE TRAY) ×3 IMPLANT
KIT TURNOVER KIT B (KITS) ×3 IMPLANT
NS IRRIG 1000ML POUR BTL (IV SOLUTION) ×3 IMPLANT
PACK CV ACCESS (CUSTOM PROCEDURE TRAY) ×3 IMPLANT
PAD ARMBOARD 7.5X6 YLW CONV (MISCELLANEOUS) ×6 IMPLANT
SPONGE SURGIFOAM ABS GEL 100 (HEMOSTASIS) IMPLANT
SUT PROLENE 5 0 C 1 24 (SUTURE) ×3 IMPLANT
SUT PROLENE 6 0 BV (SUTURE) ×6 IMPLANT
SUT VIC AB 3-0 SH 27 (SUTURE) ×6
SUT VIC AB 3-0 SH 27X BRD (SUTURE) ×2 IMPLANT
SUT VICRYL 4-0 PS2 18IN ABS (SUTURE) ×3 IMPLANT
TOWEL GREEN STERILE (TOWEL DISPOSABLE) ×3 IMPLANT
UNDERPAD 30X36 HEAVY ABSORB (UNDERPADS AND DIAPERS) ×3 IMPLANT
WATER STERILE IRR 1000ML POUR (IV SOLUTION) ×3 IMPLANT

## 2020-08-08 NOTE — Anesthesia Postprocedure Evaluation (Signed)
Anesthesia Post Note  Patient: Patrick [name]  Procedure(s) Performed: ANEURYSM EXCISION OF RIGHT UPPER EXTREMITY ARTERIOVENOUS FISTULA (Right Arm Upper)     Patient location during evaluation: PACU Anesthesia Type: General Level of consciousness: awake and alert Pain management: pain level controlled Vital Signs Assessment: post-procedure vital signs reviewed and stable Respiratory status: spontaneous breathing, nonlabored ventilation, respiratory function stable and patient connected to nasal cannula oxygen Cardiovascular status: blood pressure returned to baseline and stable Postop Assessment: no apparent nausea or vomiting Anesthetic complications: no   No complications documented.  Last Vitals:  Vitals:   08/08/20 0940 08/08/20 0955  BP: 125/83 119/70  Pulse: 73 73  Resp: 11 13  Temp:  (!) 36.2 C  SpO2: 99% 98%    Last Pain:  Vitals:   08/08/20 0955  TempSrc:   PainSc: [place]

## 2020-08-08 NOTE — Op Note (Signed)
    NAME: LUCHIANO BILLADEAU    MRN: [id number] DOB: 05-Aug-1955    DATE OF OPERATION: 08/08/2020  PREOP DIAGNOSIS:    Aneurysmal right upper arm AV fistula  POSTOP DIAGNOSIS:    Same  PROCEDURE:    Excision of aneurysm of right upper arm fistula  SURGEON: Judeth Cornfield. Scot Dock, MD  ASSIST: Graylon Good, RNFA  ANESTHESIA: General  EBL: Minimal  INDICATIONS:    Patrick [name] is a 65 y.o. male who had a large aneurysm in the central portion of his right upper arm fistula.  He was set up for excision of this aneurysm  FINDINGS:   Excellent thrill at the completion of the procedure.  Brisk radial and ulnar signal with a Doppler.  TECHNIQUE:   The patient was taken to the operating room and received a general anesthetic.  The right arm was prepped and draped in usual sterile fashion.  A fishmouth incision was made encompassing this large aneurysm in the central portion of the fistula.  The fistula was dissected free circumferentially back to a more normal-appearing vein.  The patient was heparinized.  The fistula was clamped proximally and distally and then this large aneurysm was excised.  I was then able to sew the vein back into end with running 5-0 Prolene suture.  The heparin was then partially reversed with protamine.  Hemostasis was obtained in the wound.  There was an excellent thrill in the fistula and a good radial and ulnar signal with a Doppler.  The wound was closed with a deep layer of 3-0 Vicryl and the skin closed with 4-0 Vicryl.  Dermabond was applied.  The patient tolerated the procedure well and was transferred to the recovery room in stable condition.  All needle and sponge counts were correct.  Given the complexity of the case a first assistant was necessary in order to expedient the procedure and safely perform the technical aspects of the operation.  Deitra Mayo, MD, FACS [facility]  DATE OF DICTATION:    08/08/2020

## 2020-08-08 NOTE — Interval H&P Note (Signed)
History and Physical Interval Note:  08/08/2020 7:11 AM  Patrick [name]  has presented today for surgery, with the diagnosis of ESRD.  The various methods of treatment have been discussed with the patient and family. After consideration of risks, benefits and other options for treatment, the patient has consented to  Procedure(s): ANEURYSM EXCISION OF LEFT UPPER EXTREMITY ARTERIOVENOUS FISTULA (Left) as a surgical intervention.  The patient's history has been reviewed, patient examined, no change in status, stable for surgery.  I have reviewed the patient's chart and labs.  Questions were answered to the patient's satisfaction.     Deitra Mayo

## 2020-08-08 NOTE — Progress Notes (Signed)
Patient states that we are working on the right arm but consent states left.  Dr. Scot Dock paged to confirm laterality.

## 2020-08-08 NOTE — Transfer of Care (Signed)
Immediate Anesthesia Transfer of Care Note  Patient: Patrick [name]  Procedure(s) Performed: ANEURYSM EXCISION OF RIGHT UPPER EXTREMITY ARTERIOVENOUS FISTULA (Right Arm Upper)  Patient Location: PACU  Anesthesia Type:General  Level of Consciousness: responds to stimulation  Airway & Oxygen Therapy: Patient Spontanous Breathing and Patient connected to face mask oxygen  Post-op Assessment: Report given to RN and Post -op Vital signs reviewed and stable  Post vital signs: Reviewed and stable  Last Vitals:  Vitals Value Taken Time  BP 113/81 08/08/20 0857  Temp    Pulse 75 08/08/20 0855  Resp 17 08/08/20 0857  SpO2    Vitals shown include unvalidated device data.  Last Pain:  Vitals:   08/08/20 0609  TempSrc: Oral         Complications: No complications documented.

## 2020-08-08 NOTE — Anesthesia Procedure Notes (Signed)
Procedure Name: LMA Insertion Date/Time: 08/08/2020 7:25 AM Performed by: Babs Bertin, CRNA Pre-anesthesia Checklist: Patient identified, Emergency Drugs available, Suction available and Patient being monitored Patient Re-evaluated:Patient Re-evaluated prior to induction Oxygen Delivery Method: Circle System Utilized Preoxygenation: Pre-oxygenation with 100% oxygen Induction Type: IV induction Ventilation: Mask ventilation without difficulty LMA: LMA inserted LMA Size: 4.0 Number of attempts: 1 Airway Equipment and Method: Bite block Placement Confirmation: positive ETCO2 Tube secured with: Tape Dental Injury: Teeth and Oropharynx as per pre-operative assessment

## 2020-08-09 ENCOUNTER — Encounter (HOSPITAL_COMMUNITY): Payer: Self-pay | Admitting: Vascular Surgery

## 2020-08-11 DIAGNOSIS — K922 Gastrointestinal hemorrhage, unspecified: Secondary | ICD-10-CM | POA: Diagnosis not present

## 2020-08-11 DIAGNOSIS — Z992 Dependence on renal dialysis: Secondary | ICD-10-CM | POA: Diagnosis not present

## 2020-08-11 DIAGNOSIS — D5700 Hb-SS disease with crisis, unspecified: Secondary | ICD-10-CM | POA: Diagnosis not present

## 2020-08-11 DIAGNOSIS — N186 End stage renal disease: Secondary | ICD-10-CM | POA: Diagnosis not present

## 2020-08-15 DIAGNOSIS — I158 Other secondary hypertension: Secondary | ICD-10-CM | POA: Diagnosis not present

## 2020-08-15 DIAGNOSIS — Z992 Dependence on renal dialysis: Secondary | ICD-10-CM | POA: Diagnosis not present

## 2020-08-15 DIAGNOSIS — N186 End stage renal disease: Secondary | ICD-10-CM | POA: Diagnosis not present

## 2020-08-16 DIAGNOSIS — N186 End stage renal disease: Secondary | ICD-10-CM | POA: Diagnosis not present

## 2020-08-16 DIAGNOSIS — D57819 Other sickle-cell disorders with crisis, unspecified: Secondary | ICD-10-CM | POA: Diagnosis not present

## 2020-08-16 DIAGNOSIS — E876 Hypokalemia: Secondary | ICD-10-CM | POA: Diagnosis not present

## 2020-08-16 DIAGNOSIS — D631 Anemia in chronic kidney disease: Secondary | ICD-10-CM | POA: Diagnosis not present

## 2020-08-16 DIAGNOSIS — D57219 Sickle-cell/Hb-C disease with crisis, unspecified: Secondary | ICD-10-CM | POA: Diagnosis not present

## 2020-08-16 DIAGNOSIS — D571 Sickle-cell disease without crisis: Secondary | ICD-10-CM | POA: Diagnosis not present

## 2020-08-16 DIAGNOSIS — Z992 Dependence on renal dialysis: Secondary | ICD-10-CM | POA: Diagnosis not present

## 2020-08-16 DIAGNOSIS — N2581 Secondary hyperparathyroidism of renal origin: Secondary | ICD-10-CM | POA: Diagnosis not present

## 2020-08-16 DIAGNOSIS — D509 Iron deficiency anemia, unspecified: Secondary | ICD-10-CM | POA: Diagnosis not present

## 2020-08-20 ENCOUNTER — Other Ambulatory Visit (HOSPITAL_COMMUNITY): Payer: Self-pay | Admitting: Internal Medicine

## 2020-08-20 ENCOUNTER — Other Ambulatory Visit: Payer: Self-pay | Admitting: Family Medicine

## 2020-08-20 DIAGNOSIS — M79601 Pain in right arm: Secondary | ICD-10-CM

## 2020-08-22 NOTE — Telephone Encounter (Signed)
Please see refill request.

## 2020-08-23 ENCOUNTER — Other Ambulatory Visit: Payer: Self-pay

## 2020-08-23 ENCOUNTER — Encounter: Payer: Self-pay | Admitting: Family Medicine

## 2020-08-23 ENCOUNTER — Ambulatory Visit (INDEPENDENT_AMBULATORY_CARE_PROVIDER_SITE_OTHER): Payer: HMO | Admitting: Family Medicine

## 2020-08-23 VITALS — BP 137/69 | HR 72 | Temp 98.1°F | Ht 71.0 in | Wt 158.0 lb

## 2020-08-23 DIAGNOSIS — G894 Chronic pain syndrome: Secondary | ICD-10-CM | POA: Diagnosis not present

## 2020-08-23 DIAGNOSIS — D571 Sickle-cell disease without crisis: Secondary | ICD-10-CM

## 2020-08-23 DIAGNOSIS — N186 End stage renal disease: Secondary | ICD-10-CM

## 2020-08-23 DIAGNOSIS — D509 Iron deficiency anemia, unspecified: Secondary | ICD-10-CM | POA: Diagnosis not present

## 2020-08-23 DIAGNOSIS — Z09 Encounter for follow-up examination after completed treatment for conditions other than malignant neoplasm: Secondary | ICD-10-CM

## 2020-08-23 DIAGNOSIS — N2581 Secondary hyperparathyroidism of renal origin: Secondary | ICD-10-CM | POA: Diagnosis not present

## 2020-08-23 DIAGNOSIS — F17200 Nicotine dependence, unspecified, uncomplicated: Secondary | ICD-10-CM | POA: Diagnosis not present

## 2020-08-23 DIAGNOSIS — D57819 Other sickle-cell disorders with crisis, unspecified: Secondary | ICD-10-CM | POA: Diagnosis not present

## 2020-08-23 DIAGNOSIS — D57219 Sickle-cell/Hb-C disease with crisis, unspecified: Secondary | ICD-10-CM | POA: Diagnosis not present

## 2020-08-23 DIAGNOSIS — D631 Anemia in chronic kidney disease: Secondary | ICD-10-CM | POA: Diagnosis not present

## 2020-08-23 DIAGNOSIS — Z992 Dependence on renal dialysis: Secondary | ICD-10-CM | POA: Diagnosis not present

## 2020-08-23 DIAGNOSIS — E876 Hypokalemia: Secondary | ICD-10-CM | POA: Diagnosis not present

## 2020-08-23 MED ORDER — OXYCODONE HCL 10 MG PO TABS: 10.0000 mg | ORAL_TABLET | ORAL | 0 refills | Status: DC | PRN

## 2020-08-23 NOTE — Progress Notes (Signed)
[facility]  Established Patient Office Visit  Subjective:  Patient ID: Patrick [name], male    DOB: 12/19/1955  Age: 65 y.o. MRN: [id number]  CC:  Chief Complaint  Patient presents with  . Follow-up    Follow up , think he has fluid, bloating in stomach , swelling feel like all over , pt out is not good pt is going to dialysis  3 days tues, thurs, satur., pt having in pelvic area      HPI Patrick [name] is a 65 year old male with a medical history significant for sickle cell anemia, end-stage renal disease on dialysis, atrial fibrillation on Eliquis, history of GI bleeding in the past, systolic congestive heart failure, gout, and hypertension that presents for follow-up of chronic conditions.  Also, patient was recently discharged from inpatient services on 08/05/2020.  Patient was admitted on 08/03/2020 for possible GI bleed.  Patient stated that 2 days prior to admission he arrived home from work and felt nauseous.  He continued to feel nauseous throughout the afternoon into the evening.  Around 6 PM he says that he started vomiting with "dark and bloody" vomitus.  Patient says that he experienced at least 4 episodes of large-volume vomitus all appearing to be dark in color.  Shortly after vomiting, patient began to experience severe pain of bilateral thighs and legs, which he equated to his typical sickle cell pain crisis. Patient underwent an EGD on 08/04/2020 which showed a single area of ectopic gastric mucosa that was found to be in the proximal esophagus.  Bleeding and no stigmata of recent bleeding were found in the prepyloric region of the small hiatal hernia was present.  EGD was otherwise unremarkable.  Patient was advised to follow-up with GI as an outpatient.  He was continued on Protonix 40 mg twice a day.  Patient resumed Eliquis and has been taking medications without interruption.  Today, patient states that he feels  bloating in his stomach.  He also endorses weakness which he equates to undergoing dialysis this a.m.  He denies any headache, chest pain, urinary symptoms, nausea, vomiting, or diarrhea today  Past Medical History:  Diagnosis Date  . Blood dyscrasia   . Blood transfusion    "I've had a bunch of them"  . CHF (congestive heart failure) ([place])    followed by hf clinic  . Chronic kidney disease (CKD), stage III (moderate) ([facility])   . Dialysis patient ([place]) 04/2019   AV fistula (right arm)  . Gout   . [id number])    "probably weekly" (01/13/2014)  . Legally blind   . Shortness of breath dyspnea   . Sickle cell disease, type SS ([place])   . Swelling abdomen   . Swelling of extremity, left   . Swelling of extremity, right     Past Surgical History:  Procedure Laterality Date  . A/V FISTULAGRAM Right 07/29/2020   Procedure: A/V FISTULAGRAM - Right Arm;  Surgeon: Angelia Mould, MD;  Location: [place] [facility];  Service: Cardiovascular;  Laterality: Right;  . BASCILIC VEIN TRANSPOSITION Right 04/12/2014   Procedure: BASILIC VEIN TRANSPOSITION;  Surgeon: Elam Dutch, MD;  Location: [place];  Service: Vascular;  Laterality: Right;  . BIOPSY  08/04/2020   Procedure: BIOPSY;  Surgeon: Ladene Artist, MD;  Location[facility] END[facility]COPY;  Service: Gastroenterology;;  . CARDIAC CATHETERIZATION  05/2011  . CARDIOVERSION N/A 06/05/2018   Procedure: CARDIOVERSION;  Surgeon: Bensimhon,  Shaune Pascal, MD;  Location: [facility] ENDOSCOPY;  Service: Cardiovascular;  Laterality: N/A;  . CARDIOVERSION N/A 10/06/2019   Procedure: CARDIOVERSION;  Surgeon: Jolaine Artist, MD;  Location: [place];  Service: Cardiovascular;  Laterality: N/A;  . COLONOSCOPY WITH PROPOFOL N/A 09/24/2017   Procedure: COLONOSCOPY WITH PROPOFOL;  Surgeon: Jackquline Denmark, MD;  Location[facility] ENDOSCOPY;  Service: Endoscopy;  Laterality: N/A;  . ESOPHAGOGASTRODUODENOSCOPY N/A 09/19/2017   Procedure: ESOPHAGOGASTRODUODENOSCOPY (EGD);   Surgeon: Jackquline Denmark, MD;  Location: [facility] ENDOSCOPY;  Service: Endoscopy;  Laterality: N/A;  . ESOPHAGOGASTRODUODENOSCOPY (EGD) WITH PROPOFOL N/A 08/04/2020   Procedure: ESOPHAGOGASTRODUODENOSCOPY (EGD) WITH PROPOFOL;  Surgeon: Ladene Artist, MD;  Location: [place];  Service: Gastroenterology;  Laterality: N/A;  . LEFT AND RIGHT HEART CATHETERIZATION WITH CORONARY ANGIOGRAM N/A 06/11/2011   Procedure: LEFT AND RIGHT HEART CATHETERIZATION WITH CORONARY ANGIOGRAM;  Surgeon: Larey Dresser, MD;  Location[facility] CATH LAB;  Service: Cardiovascular;  Laterality: N/A;  . PERIPHERAL VASCULAR BALLOON ANGIOPLASTY Right 07/29/2020   Procedure: PERIPHERAL VASCULAR BALLOON ANGIOPLASTY;  Surgeon: Angelia Mould, MD;  Location: [place] [facility];  Service: Cardiovascular;  Laterality: Right;  arm fistula  . REVISON OF ARTERIOVENOUS FISTULA Right 08/08/2020   Procedure: ANEURYSM EXCISION OF RIGHT UPPER EXTREMITY ARTERIOVENOUS FISTULA;  Surgeon: Angelia Mould, MD;  Location: [place];  Service: Vascular;  Laterality: Right;  . TEE WITHOUT CARDIOVERSION N/A 12/27/2015   Procedure: TRANSESOPHAGEAL ECHOCARDIOGRAM (TEE);  Surgeon: Sueanne Margarita, MD;  Location: [facility] ENDOSCOPY;  Service: Cardiovascular;  Laterality: N/A;    Family History  Problem Relation Age of Onset  . Alcohol abuse Mother   . Liver disease Mother   . Cirrhosis Mother   . Heart attack Mother     Social History   Socioeconomic History  . Marital status: Divorced    Spouse name: Not on file  . Number of children: Not on file  . Years of education: Not on file  . Highest education level: Not on file  Occupational History  . Not on file  Tobacco Use  . Smoking status: Former Smoker    Packs/day: 0.25    Years: 41.00    Pack years: 10.25    Types: Cigarettes    Quit date: 12/08/2015    Years since quitting: 4.7  . Smokeless tobacco: Never Used  Vaping Use  . Vaping Use: Never used  Substance and Sexual Activity  .  Alcohol use: Yes    Alcohol/week: 3.0 standard drinks    Types: 1 Glasses of wine, 2 Cans of beer per week    Comment: occasional  . Drug use: No  . Sexual activity: Not Currently  Other Topics Concern  . Not on file  Social History Narrative  . Not on file   Social Determinants of Health   Financial Resource Strain: Not on file  Food Insecurity: Not on file  Transportation Needs: No Transportation Needs  . Lack of Transportation (Medical): No  . Lack of Transportation (Non-Medical): No  Physical Activity: Sufficiently Active  . Days of Exercise per Week: 5 days  . Minutes of Exercise per Session: 90 min  Stress: Not on file  Social Connections: Not on file  Intimate Partner Violence: Not on file    Outpatient Medications Prior to Visit  Medication Sig Dispense Refill  . acetaminophen (TYLENOL) 500 MG tablet Take 1,000 mg by mouth every 6 (six) hours as needed for moderate pain or headache.    . albuterol (VENTOLIN HFA) 108 (  90 Base) MCG/ACT inhaler Inhale 2 puffs into the lungs every 6 (six) hours as needed for wheezing or shortness of breath. 8 g 6  . allopurinol (ZYLOPRIM) 100 MG tablet Take 1 tablet (100 mg total) by mouth daily. (Patient taking differently: Take 100 mg by mouth 2 (two) times daily.) 30 tablet 1  . carvedilol (COREG) 12.5 MG tablet TAKE 1 TABLET BY MOUTH 2 (TWO) TIMES DAILY WITH A MEAL. 180 tablet 2  . diphenhydramine-acetaminophen (TYLENOL PM) 25-500 MG TABS tablet Take 2 tablets by mouth at bedtime as needed (sleep).    Marland Kitchen ELIQUIS 5 MG TABS tablet TAKE 1 TABLET BY MOUTH TWICE A DAY (Patient taking differently: Take 5 mg by mouth 2 (two) times daily.) 60 tablet 11  . folic acid (FOLVITE) 1 MG tablet TAKE 1 TABLET BY MOUTH EVERY DAY (Patient taking differently: Take 1 mg by mouth daily. TAKE 1 TABLET BY MOUTH EVERY DAY) 90 tablet 3  . gabapentin (NEURONTIN) 100 MG capsule TAKE 1 CAPSULE BY MOUTH AT BEDTIME. 30 capsule 3  . hydroxyurea (HYDREA) 500 MG capsule  TAKE 1 CAPSULE BY MOUTH EVERY DAY WITH FOOD TO MINIMIZE GI SIDE EFFECTS (Patient taking differently: Take 500 mg by mouth daily. TAKE 1 CAPSULE BY MOUTH EVERY DAY WITH FOOD TO MINIMIZE GI SIDE EFFECTS) 90 capsule 3  . isosorbide mononitrate (IMDUR) 30 MG 24 hr tablet Take 1 tablet (30 mg total) by mouth daily. DO NOT TAKE AM ON DIALYSIS DAYS. (Patient taking differently: Take 30 mg by mouth daily.) 30 tablet 6  . lidocaine-prilocaine (EMLA) cream Apply 1 application topically daily as needed (port access).     . metolazone (ZAROXOLYN) 5 MG tablet Take 1 tablet (5 mg total) by mouth daily as needed. (Patient taking differently: Take 5 mg by mouth daily as needed (fluid).) 10 tablet 0  . Oxycodone HCl 10 MG TABS Take 1 tablet (10 mg total) by mouth every 4 (four) hours as needed (pain). 60 tablet 0  . tiZANidine (ZANAFLEX) 4 MG tablet Take 0.5 tablets (2 mg total) by mouth every 8 (eight) hours as needed for muscle spasms. 30 tablet 1  . torsemide (DEMADEX) 20 MG tablet Take 80 mg by mouth daily.    . hydrALAZINE (APRESOLINE) 25 MG tablet Take 1 tablet (25 mg total) by mouth in the morning and at bedtime. DO NOT TAKE AM DOSES ON DIALYSIS DAYS (Patient taking differently: Take 25 mg by mouth See admin instructions. Take 1 tablet by mouth twice daily all days, EXCEPT on dialysis day (T, Th, Sat) take in the evening only) 60 tablet 3  . ferric citrate (AURYXIA) 1 GM 210 MG(Fe) tablet Take 210 mg by mouth See admin instructions. Take 210 mg with each meal and snack    . oxyCODONE (ROXICODONE) 5 MG immediate release tablet Take 1 tablet (5 mg total) by mouth every 4 (four) hours as needed. 15 tablet 0   Facility-Administered Medications Prior to Visit  Medication Dose Route Frequency Provider Last Rate Last Admin  . 0.9 %  sodium chloride infusion  250 mL Intravenous PRN Elam Dutch, MD      . sodium chloride flush (NS) 0.9 % injection 3 mL  3 mL Intravenous Q12H Fields, Charles E, MD      . sodium  chloride flush (NS) 0.9 % injection 3 mL  3 mL Intravenous PRN Elam Dutch, MD        No Known Allergies  ROS Review of Systems  Constitutional: Positive for fatigue. Negative for unexpected weight change.  HENT: Negative.   Eyes: Positive for visual disturbance.  Respiratory: Negative.   Gastrointestinal: Negative.   Endocrine: Negative.   Genitourinary: Negative.   Musculoskeletal: Positive for arthralgias.  Neurological: Negative for dizziness, weakness and headaches.  Psychiatric/Behavioral: Negative.       Objective:    Physical Exam Constitutional:      Appearance: He is not ill-appearing or toxic-appearing.  Eyes:     Pupils: Pupils are equal, round, and reactive to light.  Cardiovascular:     Rate and Rhythm: Normal rate. Rhythm irregular.  Abdominal:     General: There is distension.     Tenderness: There is abdominal tenderness.  Musculoskeletal:        General: Normal range of motion.  Skin:    General: Skin is warm.  Neurological:     General: No focal deficit present.     Mental Status: Mental status is at baseline.  Psychiatric:        Mood and Affect: Mood normal.        Thought Content: Thought content normal.        Judgment: Judgment normal.     BP 137/69 (BP Location: Left Arm, Patient Position: Sitting, Cuff Size: Normal)   Pulse 72   Temp 98.1 F (36.7 C) (Temporal)   Ht '5\' 11"'$  (1.803 m)   Wt 158 lb (71.7 kg)   SpO2 (!) 87%   BMI 22.04 kg/m  Wt Readings from Last 3 Encounters:  08/23/20 158 lb (71.7 kg)  08/08/20 142 lb (64.4 kg)  08/05/20 142 lb 3.2 oz (64.5 kg)     Health Maintenance Due  Topic Date Due  . COVID-19 Vaccine (3 - Booster for Pfizer series) 04/27/2020    There are no preventive care reminders to display for this patient.  Lab Results  Component Value Date   TSH 2.301 02/17/2015   Lab Results  Component Value Date   WBC 4.8 08/05/2020   HGB 9.5 (L) 08/08/2020   HCT 28.0 (L) 08/08/2020   MCV 106.6  (H) 08/05/2020   PLT 85 (L) 08/05/2020   Lab Results  Component Value Date   NA 138 08/08/2020   K 4.1 08/08/2020   CO2 27 08/05/2020   GLUCOSE 89 08/08/2020   BUN 43 (H) 08/08/2020   CREATININE 6.40 (H) 08/08/2020   BILITOT 2.3 (H) 08/04/2020   ALKPHOS 80 08/04/2020   AST 26 08/04/2020   ALT 15 08/04/2020   PROT 6.1 (L) 08/04/2020   ALBUMIN 2.8 (L) 08/04/2020   CALCIUM 7.6 (L) 08/05/2020   ANIONGAP 10 08/05/2020   GFR 46.63 (L) 01/08/2012   Lab Results  Component Value Date   CHOL 114 (L) 02/17/2015   Lab Results  Component Value Date   HDL 31 (L) 02/17/2015   Lab Results  Component Value Date   LDLCALC 68 02/17/2015   Lab Results  Component Value Date   TRIG 76 02/17/2015   Lab Results  Component Value Date   CHOLHDL 3.7 02/17/2015   Lab Results  Component Value Date   HGBA1C <4.0 06/07/2011      Assessment & Plan:   Problem List Items Addressed This Visit      Other   Hb-SS disease without crisis ([place]) - Primary   Relevant Orders   POCT Urinalysis Dipstick      1. Hb-SS disease without crisis ([place]) Sickle cell disease - Continue Hydrea. We discussed the need  for good hydration, monitoring of hydration status, avoidance of heat, cold, stress, and infection triggers.  The patient was reminded of the need to seek medical attention of any symptoms of bleeding, anemia, or infection. Continue folic acid 1 mg daily to prevent aplastic bone marrow crises.   Pulmonary evaluation - Patient denies severe recurrent wheezes, shortness of breath with exercise, or persistent cough. If these symptoms develop, pulmonary function tests with spirometry will be ordered, and if abnormal, plan on referral to Pulmonology for further evaluation.  Cardiac -follow-up with cardiology as scheduled  Eye -patient has a history of proliferative retinopathy.  He is legally blind.  He follows up with ophthalmology regularly   Immunization status -patient is up-to-date with  immunizations including COVID-19  Acute and chronic painful episodes -no changes to opiate medication regimen at this time.     - POCT Urinalysis Dipstick  2. Chronic pain syndrome Reviewed PDMP substance reporting system prior to prescribing opiate medications. No inconsistencies noted.    - Oxycodone HCl 10 MG TABS; Take 1 tablet (10 mg total) by mouth every 4 (four) hours as needed (pain).  Dispense: 90 tablet; Refill: 0  3. End stage renal disease ([place]) Continue HD on Tuesday, Thursday, and Saturday.  Follow-up with nephrology as scheduled.  4. Tobacco dependence Smoking cessation instruction/counseling given:  counseled patient on the dangers of tobacco use, advised patient to stop smoking, and reviewed strategies to maximize success   5. Hospital discharge follow up  Patient to schedule follow up with gastroenterology Protonix 40 mg twice daily was continued per GI    Follow-up: 3 months for chronic conditions    Terrence Wishon Al Decant  APRN, MSN, FNP-C Patient [place] [address] [place], [place] [address] [phone]

## 2020-08-25 ENCOUNTER — Other Ambulatory Visit (HOSPITAL_COMMUNITY): Payer: Self-pay

## 2020-08-25 MED ORDER — ISOSORBIDE MONONITRATE ER 30 MG PO TB24: 30.0000 mg | ORAL_TABLET | Freq: Every day | ORAL | 1 refills | Status: DC

## 2020-08-30 DIAGNOSIS — N186 End stage renal disease: Secondary | ICD-10-CM | POA: Diagnosis not present

## 2020-08-30 DIAGNOSIS — N2581 Secondary hyperparathyroidism of renal origin: Secondary | ICD-10-CM | POA: Diagnosis not present

## 2020-08-30 DIAGNOSIS — D509 Iron deficiency anemia, unspecified: Secondary | ICD-10-CM | POA: Diagnosis not present

## 2020-08-30 DIAGNOSIS — D57819 Other sickle-cell disorders with crisis, unspecified: Secondary | ICD-10-CM | POA: Diagnosis not present

## 2020-08-30 DIAGNOSIS — D631 Anemia in chronic kidney disease: Secondary | ICD-10-CM | POA: Diagnosis not present

## 2020-08-30 DIAGNOSIS — D571 Sickle-cell disease without crisis: Secondary | ICD-10-CM | POA: Diagnosis not present

## 2020-08-30 DIAGNOSIS — D57219 Sickle-cell/Hb-C disease with crisis, unspecified: Secondary | ICD-10-CM | POA: Diagnosis not present

## 2020-08-30 DIAGNOSIS — Z992 Dependence on renal dialysis: Secondary | ICD-10-CM | POA: Diagnosis not present

## 2020-09-02 ENCOUNTER — Other Ambulatory Visit: Payer: Self-pay

## 2020-09-02 ENCOUNTER — Non-Acute Institutional Stay (HOSPITAL_COMMUNITY)
Admission: RE | Admit: 2020-09-02 | Discharge: 2020-09-02 | Disposition: A | Discharge status: Home / Self Care | Payer: HMO | Source: Ambulatory Visit | Attending: Internal Medicine | Admitting: Internal Medicine

## 2020-09-02 DIAGNOSIS — D649 Anemia, unspecified: Secondary | ICD-10-CM | POA: Insufficient documentation

## 2020-09-02 LAB — PREPARE RBC (CROSSMATCH)

## 2020-09-02 MED ORDER — SODIUM CHLORIDE 0.9% IV SOLUTION: Freq: Once | INTRAVENOUS | Status: AC

## 2020-09-02 NOTE — Progress Notes (Signed)
PATIENT CARE CENTER NOTE  Diagnosis: Anemia    Provider: Corliss Parish, MD   Procedure: 1 unit PRBC   Note: Type & Screen drawn and patient given 1 unit of PRBC's via PIV. Patient tolerated transfusion well with no adverse reaction. Vital signs stable. Discharge instructions given. Patient alert, oriented and ambulatory at discharge.

## 2020-09-02 NOTE — Discharge Instructions (Signed)

## 2020-09-03 LAB — TYPE AND SCREEN
ABO/RH(D): AB POS
Antibody Screen: NEGATIVE
Unit division: 0

## 2020-09-03 LAB — BPAM RBC
Blood Product Expiration Date: 202204212359
ISSUE DATE / TIME: 202203181050
Unit Type and Rh: 5100

## 2020-09-06 DIAGNOSIS — D509 Iron deficiency anemia, unspecified: Secondary | ICD-10-CM | POA: Diagnosis not present

## 2020-09-06 DIAGNOSIS — D57819 Other sickle-cell disorders with crisis, unspecified: Secondary | ICD-10-CM | POA: Diagnosis not present

## 2020-09-06 DIAGNOSIS — D571 Sickle-cell disease without crisis: Secondary | ICD-10-CM | POA: Diagnosis not present

## 2020-09-06 DIAGNOSIS — Z992 Dependence on renal dialysis: Secondary | ICD-10-CM | POA: Diagnosis not present

## 2020-09-06 DIAGNOSIS — N186 End stage renal disease: Secondary | ICD-10-CM | POA: Diagnosis not present

## 2020-09-06 DIAGNOSIS — D57219 Sickle-cell/Hb-C disease with crisis, unspecified: Secondary | ICD-10-CM | POA: Diagnosis not present

## 2020-09-06 DIAGNOSIS — N2581 Secondary hyperparathyroidism of renal origin: Secondary | ICD-10-CM | POA: Diagnosis not present

## 2020-09-06 DIAGNOSIS — E876 Hypokalemia: Secondary | ICD-10-CM | POA: Diagnosis not present

## 2020-09-06 DIAGNOSIS — D631 Anemia in chronic kidney disease: Secondary | ICD-10-CM | POA: Diagnosis not present

## 2020-09-07 ENCOUNTER — Telehealth: Payer: Self-pay

## 2020-09-07 NOTE — Telephone Encounter (Signed)
Med refill for allopurinol

## 2020-09-12 ENCOUNTER — Telehealth: Payer: Self-pay | Admitting: Family Medicine

## 2020-09-13 ENCOUNTER — Other Ambulatory Visit: Payer: Self-pay | Admitting: Family Medicine

## 2020-09-13 DIAGNOSIS — D509 Iron deficiency anemia, unspecified: Secondary | ICD-10-CM | POA: Diagnosis not present

## 2020-09-13 DIAGNOSIS — E876 Hypokalemia: Secondary | ICD-10-CM | POA: Diagnosis not present

## 2020-09-13 DIAGNOSIS — D571 Sickle-cell disease without crisis: Secondary | ICD-10-CM | POA: Diagnosis not present

## 2020-09-13 DIAGNOSIS — N2581 Secondary hyperparathyroidism of renal origin: Secondary | ICD-10-CM | POA: Diagnosis not present

## 2020-09-13 DIAGNOSIS — N186 End stage renal disease: Secondary | ICD-10-CM | POA: Diagnosis not present

## 2020-09-13 DIAGNOSIS — Z992 Dependence on renal dialysis: Secondary | ICD-10-CM | POA: Diagnosis not present

## 2020-09-13 DIAGNOSIS — D631 Anemia in chronic kidney disease: Secondary | ICD-10-CM | POA: Diagnosis not present

## 2020-09-13 DIAGNOSIS — D57219 Sickle-cell/Hb-C disease with crisis, unspecified: Secondary | ICD-10-CM | POA: Diagnosis not present

## 2020-09-13 DIAGNOSIS — D57819 Other sickle-cell disorders with crisis, unspecified: Secondary | ICD-10-CM | POA: Diagnosis not present

## 2020-09-13 MED ORDER — ALLOPURINOL 100 MG PO TABS: 100.0000 mg | ORAL_TABLET | Freq: Every day | ORAL | 1 refills | Status: DC

## 2020-09-13 NOTE — Progress Notes (Signed)
Meds ordered this encounter  Medications  . allopurinol (ZYLOPRIM) 100 MG tablet    Sig: Take 1 tablet (100 mg total) by mouth daily.    Dispense:  30 tablet    Refill:  1    Order Specific Question:   Supervising Provider    Answer:   [name] [id number]

## 2020-09-15 ENCOUNTER — Other Ambulatory Visit: Payer: Self-pay | Admitting: Family Medicine

## 2020-09-15 DIAGNOSIS — M545 Low back pain, unspecified: Secondary | ICD-10-CM

## 2020-09-15 DIAGNOSIS — N186 End stage renal disease: Secondary | ICD-10-CM | POA: Diagnosis not present

## 2020-09-15 DIAGNOSIS — I158 Other secondary hypertension: Secondary | ICD-10-CM | POA: Diagnosis not present

## 2020-09-15 DIAGNOSIS — Z992 Dependence on renal dialysis: Secondary | ICD-10-CM | POA: Diagnosis not present

## 2020-09-15 NOTE — Telephone Encounter (Signed)
DOne

## 2020-09-17 DIAGNOSIS — D57819 Other sickle-cell disorders with crisis, unspecified: Secondary | ICD-10-CM | POA: Diagnosis not present

## 2020-09-17 DIAGNOSIS — D571 Sickle-cell disease without crisis: Secondary | ICD-10-CM | POA: Diagnosis not present

## 2020-09-17 DIAGNOSIS — D57219 Sickle-cell/Hb-C disease with crisis, unspecified: Secondary | ICD-10-CM | POA: Diagnosis not present

## 2020-09-17 DIAGNOSIS — N2581 Secondary hyperparathyroidism of renal origin: Secondary | ICD-10-CM | POA: Diagnosis not present

## 2020-09-17 DIAGNOSIS — N186 End stage renal disease: Secondary | ICD-10-CM | POA: Diagnosis not present

## 2020-09-17 DIAGNOSIS — Z992 Dependence on renal dialysis: Secondary | ICD-10-CM | POA: Diagnosis not present

## 2020-09-20 DIAGNOSIS — E876 Hypokalemia: Secondary | ICD-10-CM | POA: Diagnosis not present

## 2020-09-20 DIAGNOSIS — D57819 Other sickle-cell disorders with crisis, unspecified: Secondary | ICD-10-CM | POA: Diagnosis not present

## 2020-09-20 DIAGNOSIS — D631 Anemia in chronic kidney disease: Secondary | ICD-10-CM | POA: Diagnosis not present

## 2020-09-20 DIAGNOSIS — N186 End stage renal disease: Secondary | ICD-10-CM | POA: Diagnosis not present

## 2020-09-20 DIAGNOSIS — Z992 Dependence on renal dialysis: Secondary | ICD-10-CM | POA: Diagnosis not present

## 2020-09-20 DIAGNOSIS — D571 Sickle-cell disease without crisis: Secondary | ICD-10-CM | POA: Diagnosis not present

## 2020-09-20 DIAGNOSIS — D509 Iron deficiency anemia, unspecified: Secondary | ICD-10-CM | POA: Diagnosis not present

## 2020-09-20 DIAGNOSIS — N2581 Secondary hyperparathyroidism of renal origin: Secondary | ICD-10-CM | POA: Diagnosis not present

## 2020-09-20 DIAGNOSIS — D57219 Sickle-cell/Hb-C disease with crisis, unspecified: Secondary | ICD-10-CM | POA: Diagnosis not present

## 2020-09-27 DIAGNOSIS — E876 Hypokalemia: Secondary | ICD-10-CM | POA: Diagnosis not present

## 2020-09-27 DIAGNOSIS — N186 End stage renal disease: Secondary | ICD-10-CM | POA: Diagnosis not present

## 2020-09-27 DIAGNOSIS — N2581 Secondary hyperparathyroidism of renal origin: Secondary | ICD-10-CM | POA: Diagnosis not present

## 2020-09-27 DIAGNOSIS — D57819 Other sickle-cell disorders with crisis, unspecified: Secondary | ICD-10-CM | POA: Diagnosis not present

## 2020-09-27 DIAGNOSIS — D509 Iron deficiency anemia, unspecified: Secondary | ICD-10-CM | POA: Diagnosis not present

## 2020-09-27 DIAGNOSIS — Z992 Dependence on renal dialysis: Secondary | ICD-10-CM | POA: Diagnosis not present

## 2020-09-27 DIAGNOSIS — D631 Anemia in chronic kidney disease: Secondary | ICD-10-CM | POA: Diagnosis not present

## 2020-09-27 DIAGNOSIS — D571 Sickle-cell disease without crisis: Secondary | ICD-10-CM | POA: Diagnosis not present

## 2020-09-27 DIAGNOSIS — D57219 Sickle-cell/Hb-C disease with crisis, unspecified: Secondary | ICD-10-CM | POA: Diagnosis not present

## 2020-09-29 ENCOUNTER — Telehealth: Payer: Self-pay

## 2020-09-29 NOTE — Telephone Encounter (Signed)
Med refill for his pain medicine and was specific on which one

## 2020-09-30 ENCOUNTER — Other Ambulatory Visit: Payer: Self-pay | Admitting: Family Medicine

## 2020-09-30 DIAGNOSIS — G894 Chronic pain syndrome: Secondary | ICD-10-CM

## 2020-09-30 MED ORDER — OXYCODONE HCL 10 MG PO TABS: 10.0000 mg | ORAL_TABLET | ORAL | 0 refills | Status: DC | PRN

## 2020-09-30 NOTE — Progress Notes (Signed)
Reviewed PDMP substance reporting system prior to prescribing opiate medications. No inconsistencies noted.   Meds ordered this encounter  Medications  . Oxycodone HCl 10 MG TABS    Sig: Take 1 tablet (10 mg total) by mouth every 4 (four) hours as needed (pain).    Dispense:  90 tablet    Refill:  0    Order Specific Question:   Supervising Provider    Answer:   [name] [id number]    [name]  APRN, MSN, FNP-C Patient [place] [address] [place], [place] [address] [phone]

## 2020-10-04 DIAGNOSIS — D509 Iron deficiency anemia, unspecified: Secondary | ICD-10-CM | POA: Diagnosis not present

## 2020-10-04 DIAGNOSIS — Z992 Dependence on renal dialysis: Secondary | ICD-10-CM | POA: Diagnosis not present

## 2020-10-04 DIAGNOSIS — D631 Anemia in chronic kidney disease: Secondary | ICD-10-CM | POA: Diagnosis not present

## 2020-10-04 DIAGNOSIS — N2581 Secondary hyperparathyroidism of renal origin: Secondary | ICD-10-CM | POA: Diagnosis not present

## 2020-10-04 DIAGNOSIS — D57819 Other sickle-cell disorders with crisis, unspecified: Secondary | ICD-10-CM | POA: Diagnosis not present

## 2020-10-04 DIAGNOSIS — N186 End stage renal disease: Secondary | ICD-10-CM | POA: Diagnosis not present

## 2020-10-04 DIAGNOSIS — D571 Sickle-cell disease without crisis: Secondary | ICD-10-CM | POA: Diagnosis not present

## 2020-10-04 DIAGNOSIS — D57219 Sickle-cell/Hb-C disease with crisis, unspecified: Secondary | ICD-10-CM | POA: Diagnosis not present

## 2020-10-06 ENCOUNTER — Other Ambulatory Visit: Payer: Self-pay | Admitting: Family Medicine

## 2020-10-11 DIAGNOSIS — D631 Anemia in chronic kidney disease: Secondary | ICD-10-CM | POA: Diagnosis not present

## 2020-10-11 DIAGNOSIS — E876 Hypokalemia: Secondary | ICD-10-CM | POA: Diagnosis not present

## 2020-10-11 DIAGNOSIS — N2581 Secondary hyperparathyroidism of renal origin: Secondary | ICD-10-CM | POA: Diagnosis not present

## 2020-10-11 DIAGNOSIS — D509 Iron deficiency anemia, unspecified: Secondary | ICD-10-CM | POA: Diagnosis not present

## 2020-10-11 DIAGNOSIS — Z992 Dependence on renal dialysis: Secondary | ICD-10-CM | POA: Diagnosis not present

## 2020-10-11 DIAGNOSIS — D57819 Other sickle-cell disorders with crisis, unspecified: Secondary | ICD-10-CM | POA: Diagnosis not present

## 2020-10-11 DIAGNOSIS — D57219 Sickle-cell/Hb-C disease with crisis, unspecified: Secondary | ICD-10-CM | POA: Diagnosis not present

## 2020-10-11 DIAGNOSIS — N186 End stage renal disease: Secondary | ICD-10-CM | POA: Diagnosis not present

## 2020-10-11 DIAGNOSIS — D571 Sickle-cell disease without crisis: Secondary | ICD-10-CM | POA: Diagnosis not present

## 2020-10-15 DIAGNOSIS — Z992 Dependence on renal dialysis: Secondary | ICD-10-CM | POA: Diagnosis not present

## 2020-10-15 DIAGNOSIS — N186 End stage renal disease: Secondary | ICD-10-CM | POA: Diagnosis not present

## 2020-10-15 DIAGNOSIS — I158 Other secondary hypertension: Secondary | ICD-10-CM | POA: Diagnosis not present

## 2020-10-20 DIAGNOSIS — D631 Anemia in chronic kidney disease: Secondary | ICD-10-CM | POA: Diagnosis not present

## 2020-10-20 DIAGNOSIS — E876 Hypokalemia: Secondary | ICD-10-CM | POA: Diagnosis not present

## 2020-10-20 DIAGNOSIS — N2581 Secondary hyperparathyroidism of renal origin: Secondary | ICD-10-CM | POA: Diagnosis not present

## 2020-10-20 DIAGNOSIS — D57819 Other sickle-cell disorders with crisis, unspecified: Secondary | ICD-10-CM | POA: Diagnosis not present

## 2020-10-20 DIAGNOSIS — D571 Sickle-cell disease without crisis: Secondary | ICD-10-CM | POA: Diagnosis not present

## 2020-10-20 DIAGNOSIS — Z992 Dependence on renal dialysis: Secondary | ICD-10-CM | POA: Diagnosis not present

## 2020-10-20 DIAGNOSIS — N186 End stage renal disease: Secondary | ICD-10-CM | POA: Diagnosis not present

## 2020-10-20 DIAGNOSIS — D57219 Sickle-cell/Hb-C disease with crisis, unspecified: Secondary | ICD-10-CM | POA: Diagnosis not present

## 2020-10-25 DIAGNOSIS — Z992 Dependence on renal dialysis: Secondary | ICD-10-CM | POA: Diagnosis not present

## 2020-10-25 DIAGNOSIS — D57819 Other sickle-cell disorders with crisis, unspecified: Secondary | ICD-10-CM | POA: Diagnosis not present

## 2020-10-25 DIAGNOSIS — D631 Anemia in chronic kidney disease: Secondary | ICD-10-CM | POA: Diagnosis not present

## 2020-10-25 DIAGNOSIS — D57219 Sickle-cell/Hb-C disease with crisis, unspecified: Secondary | ICD-10-CM | POA: Diagnosis not present

## 2020-10-25 DIAGNOSIS — D571 Sickle-cell disease without crisis: Secondary | ICD-10-CM | POA: Diagnosis not present

## 2020-10-25 DIAGNOSIS — D509 Iron deficiency anemia, unspecified: Secondary | ICD-10-CM | POA: Diagnosis not present

## 2020-10-25 DIAGNOSIS — N186 End stage renal disease: Secondary | ICD-10-CM | POA: Diagnosis not present

## 2020-10-25 DIAGNOSIS — N2581 Secondary hyperparathyroidism of renal origin: Secondary | ICD-10-CM | POA: Diagnosis not present

## 2020-10-25 DIAGNOSIS — E876 Hypokalemia: Secondary | ICD-10-CM | POA: Diagnosis not present

## 2020-10-26 ENCOUNTER — Other Ambulatory Visit: Payer: Self-pay | Admitting: Family Medicine

## 2020-10-26 DIAGNOSIS — E559 Vitamin D deficiency, unspecified: Secondary | ICD-10-CM

## 2020-10-26 DIAGNOSIS — M545 Low back pain, unspecified: Secondary | ICD-10-CM

## 2020-10-28 ENCOUNTER — Other Ambulatory Visit: Payer: Self-pay | Admitting: Nurse Practitioner

## 2020-11-01 DIAGNOSIS — D571 Sickle-cell disease without crisis: Secondary | ICD-10-CM | POA: Diagnosis not present

## 2020-11-01 DIAGNOSIS — N2581 Secondary hyperparathyroidism of renal origin: Secondary | ICD-10-CM | POA: Diagnosis not present

## 2020-11-01 DIAGNOSIS — N186 End stage renal disease: Secondary | ICD-10-CM | POA: Diagnosis not present

## 2020-11-01 DIAGNOSIS — Z992 Dependence on renal dialysis: Secondary | ICD-10-CM | POA: Diagnosis not present

## 2020-11-01 DIAGNOSIS — D57219 Sickle-cell/Hb-C disease with crisis, unspecified: Secondary | ICD-10-CM | POA: Diagnosis not present

## 2020-11-01 DIAGNOSIS — D509 Iron deficiency anemia, unspecified: Secondary | ICD-10-CM | POA: Diagnosis not present

## 2020-11-01 DIAGNOSIS — D631 Anemia in chronic kidney disease: Secondary | ICD-10-CM | POA: Diagnosis not present

## 2020-11-01 DIAGNOSIS — D57819 Other sickle-cell disorders with crisis, unspecified: Secondary | ICD-10-CM | POA: Diagnosis not present

## 2020-11-02 ENCOUNTER — Other Ambulatory Visit: Payer: Self-pay | Admitting: Family Medicine

## 2020-11-02 DIAGNOSIS — M545 Low back pain, unspecified: Secondary | ICD-10-CM

## 2020-11-03 ENCOUNTER — Other Ambulatory Visit: Payer: Self-pay | Admitting: Family Medicine

## 2020-11-03 DIAGNOSIS — E559 Vitamin D deficiency, unspecified: Secondary | ICD-10-CM

## 2020-11-08 DIAGNOSIS — D57819 Other sickle-cell disorders with crisis, unspecified: Secondary | ICD-10-CM | POA: Diagnosis not present

## 2020-11-08 DIAGNOSIS — N2581 Secondary hyperparathyroidism of renal origin: Secondary | ICD-10-CM | POA: Diagnosis not present

## 2020-11-08 DIAGNOSIS — D57219 Sickle-cell/Hb-C disease with crisis, unspecified: Secondary | ICD-10-CM | POA: Diagnosis not present

## 2020-11-08 DIAGNOSIS — Z992 Dependence on renal dialysis: Secondary | ICD-10-CM | POA: Diagnosis not present

## 2020-11-08 DIAGNOSIS — E876 Hypokalemia: Secondary | ICD-10-CM | POA: Diagnosis not present

## 2020-11-08 DIAGNOSIS — D571 Sickle-cell disease without crisis: Secondary | ICD-10-CM | POA: Diagnosis not present

## 2020-11-08 DIAGNOSIS — N186 End stage renal disease: Secondary | ICD-10-CM | POA: Diagnosis not present

## 2020-11-08 DIAGNOSIS — D509 Iron deficiency anemia, unspecified: Secondary | ICD-10-CM | POA: Diagnosis not present

## 2020-11-08 DIAGNOSIS — D631 Anemia in chronic kidney disease: Secondary | ICD-10-CM | POA: Diagnosis not present

## 2020-11-15 DIAGNOSIS — D571 Sickle-cell disease without crisis: Secondary | ICD-10-CM | POA: Diagnosis not present

## 2020-11-15 DIAGNOSIS — Z992 Dependence on renal dialysis: Secondary | ICD-10-CM | POA: Diagnosis not present

## 2020-11-15 DIAGNOSIS — N2581 Secondary hyperparathyroidism of renal origin: Secondary | ICD-10-CM | POA: Diagnosis not present

## 2020-11-15 DIAGNOSIS — D509 Iron deficiency anemia, unspecified: Secondary | ICD-10-CM | POA: Diagnosis not present

## 2020-11-15 DIAGNOSIS — N186 End stage renal disease: Secondary | ICD-10-CM | POA: Diagnosis not present

## 2020-11-15 DIAGNOSIS — I158 Other secondary hypertension: Secondary | ICD-10-CM | POA: Diagnosis not present

## 2020-11-15 DIAGNOSIS — D57219 Sickle-cell/Hb-C disease with crisis, unspecified: Secondary | ICD-10-CM | POA: Diagnosis not present

## 2020-11-15 DIAGNOSIS — D57819 Other sickle-cell disorders with crisis, unspecified: Secondary | ICD-10-CM | POA: Diagnosis not present

## 2020-11-17 DIAGNOSIS — Z992 Dependence on renal dialysis: Secondary | ICD-10-CM | POA: Diagnosis not present

## 2020-11-17 DIAGNOSIS — N186 End stage renal disease: Secondary | ICD-10-CM | POA: Diagnosis not present

## 2020-11-17 DIAGNOSIS — D571 Sickle-cell disease without crisis: Secondary | ICD-10-CM | POA: Diagnosis not present

## 2020-11-17 DIAGNOSIS — D57219 Sickle-cell/Hb-C disease with crisis, unspecified: Secondary | ICD-10-CM | POA: Diagnosis not present

## 2020-11-17 DIAGNOSIS — D57819 Other sickle-cell disorders with crisis, unspecified: Secondary | ICD-10-CM | POA: Diagnosis not present

## 2020-11-17 DIAGNOSIS — N2581 Secondary hyperparathyroidism of renal origin: Secondary | ICD-10-CM | POA: Diagnosis not present

## 2020-11-18 ENCOUNTER — Other Ambulatory Visit: Payer: Self-pay | Admitting: Family Medicine

## 2020-11-18 DIAGNOSIS — D571 Sickle-cell disease without crisis: Secondary | ICD-10-CM

## 2020-11-18 DIAGNOSIS — E559 Vitamin D deficiency, unspecified: Secondary | ICD-10-CM

## 2020-11-22 DIAGNOSIS — N186 End stage renal disease: Secondary | ICD-10-CM | POA: Diagnosis not present

## 2020-11-22 DIAGNOSIS — D571 Sickle-cell disease without crisis: Secondary | ICD-10-CM | POA: Diagnosis not present

## 2020-11-22 DIAGNOSIS — D509 Iron deficiency anemia, unspecified: Secondary | ICD-10-CM | POA: Diagnosis not present

## 2020-11-22 DIAGNOSIS — D631 Anemia in chronic kidney disease: Secondary | ICD-10-CM | POA: Diagnosis not present

## 2020-11-22 DIAGNOSIS — E876 Hypokalemia: Secondary | ICD-10-CM | POA: Diagnosis not present

## 2020-11-22 DIAGNOSIS — D57219 Sickle-cell/Hb-C disease with crisis, unspecified: Secondary | ICD-10-CM | POA: Diagnosis not present

## 2020-11-22 DIAGNOSIS — N2581 Secondary hyperparathyroidism of renal origin: Secondary | ICD-10-CM | POA: Diagnosis not present

## 2020-11-22 DIAGNOSIS — D57819 Other sickle-cell disorders with crisis, unspecified: Secondary | ICD-10-CM | POA: Diagnosis not present

## 2020-11-22 DIAGNOSIS — Z992 Dependence on renal dialysis: Secondary | ICD-10-CM | POA: Diagnosis not present

## 2020-11-25 ENCOUNTER — Other Ambulatory Visit: Payer: Self-pay | Admitting: Family Medicine

## 2020-11-29 DIAGNOSIS — D509 Iron deficiency anemia, unspecified: Secondary | ICD-10-CM | POA: Diagnosis not present

## 2020-11-29 DIAGNOSIS — D57819 Other sickle-cell disorders with crisis, unspecified: Secondary | ICD-10-CM | POA: Diagnosis not present

## 2020-11-29 DIAGNOSIS — N186 End stage renal disease: Secondary | ICD-10-CM | POA: Diagnosis not present

## 2020-11-29 DIAGNOSIS — N2581 Secondary hyperparathyroidism of renal origin: Secondary | ICD-10-CM | POA: Diagnosis not present

## 2020-11-29 DIAGNOSIS — D631 Anemia in chronic kidney disease: Secondary | ICD-10-CM | POA: Diagnosis not present

## 2020-11-29 DIAGNOSIS — D571 Sickle-cell disease without crisis: Secondary | ICD-10-CM | POA: Diagnosis not present

## 2020-11-29 DIAGNOSIS — D57219 Sickle-cell/Hb-C disease with crisis, unspecified: Secondary | ICD-10-CM | POA: Diagnosis not present

## 2020-11-29 DIAGNOSIS — Z992 Dependence on renal dialysis: Secondary | ICD-10-CM | POA: Diagnosis not present

## 2020-12-06 DIAGNOSIS — D571 Sickle-cell disease without crisis: Secondary | ICD-10-CM | POA: Diagnosis not present

## 2020-12-06 DIAGNOSIS — D57219 Sickle-cell/Hb-C disease with crisis, unspecified: Secondary | ICD-10-CM | POA: Diagnosis not present

## 2020-12-06 DIAGNOSIS — Z992 Dependence on renal dialysis: Secondary | ICD-10-CM | POA: Diagnosis not present

## 2020-12-06 DIAGNOSIS — N186 End stage renal disease: Secondary | ICD-10-CM | POA: Diagnosis not present

## 2020-12-06 DIAGNOSIS — E876 Hypokalemia: Secondary | ICD-10-CM | POA: Diagnosis not present

## 2020-12-06 DIAGNOSIS — D57819 Other sickle-cell disorders with crisis, unspecified: Secondary | ICD-10-CM | POA: Diagnosis not present

## 2020-12-06 DIAGNOSIS — D509 Iron deficiency anemia, unspecified: Secondary | ICD-10-CM | POA: Diagnosis not present

## 2020-12-06 DIAGNOSIS — D631 Anemia in chronic kidney disease: Secondary | ICD-10-CM | POA: Diagnosis not present

## 2020-12-06 DIAGNOSIS — N2581 Secondary hyperparathyroidism of renal origin: Secondary | ICD-10-CM | POA: Diagnosis not present

## 2020-12-07 ENCOUNTER — Telehealth: Payer: Self-pay

## 2020-12-07 ENCOUNTER — Other Ambulatory Visit: Payer: Self-pay | Admitting: Family Medicine

## 2020-12-07 DIAGNOSIS — G894 Chronic pain syndrome: Secondary | ICD-10-CM

## 2020-12-07 DIAGNOSIS — M545 Low back pain, unspecified: Secondary | ICD-10-CM

## 2020-12-07 MED ORDER — OXYCODONE HCL 10 MG PO TABS: 10.0000 mg | ORAL_TABLET | ORAL | 0 refills | Status: DC | PRN

## 2020-12-07 MED ORDER — TIZANIDINE HCL 4 MG PO TABS: ORAL_TABLET | ORAL | 1 refills | Status: DC

## 2020-12-07 NOTE — Telephone Encounter (Signed)
Pain and muscle spasm medicine to be refilled

## 2020-12-07 NOTE — Progress Notes (Signed)
Reviewed PDMP substance reporting system prior to prescribing opiate medications. No inconsistencies noted.  Meds ordered this encounter  Medications   Oxycodone HCl 10 MG TABS    Sig: Take 1 tablet (10 mg total) by mouth every 4 (four) hours as needed (pain).    Dispense:  90 tablet    Refill:  0    Order Specific Question:   Supervising Provider    Answer:   [name] [id number]   tiZANidine (ZANAFLEX) 4 MG tablet    Sig: TAKE 1/2 TABLET BY MOUTH EVERY 8 HOURS AS NEEDED FOR MUSCLE SPASMS    Dispense:  30 tablet    Refill:  1    Order Specific Question:   Supervising Provider    Answer:   [name] [id number]     [name]  APRN, MSN, FNP-C Patient [place] [address] [place], [place] [address] [phone]

## 2020-12-09 ENCOUNTER — Other Ambulatory Visit: Payer: Self-pay | Admitting: Family Medicine

## 2020-12-09 DIAGNOSIS — M79601 Pain in right arm: Secondary | ICD-10-CM

## 2020-12-12 DIAGNOSIS — Z992 Dependence on renal dialysis: Secondary | ICD-10-CM | POA: Diagnosis not present

## 2020-12-12 DIAGNOSIS — N186 End stage renal disease: Secondary | ICD-10-CM | POA: Diagnosis not present

## 2020-12-12 DIAGNOSIS — I5020 Unspecified systolic (congestive) heart failure: Secondary | ICD-10-CM | POA: Diagnosis not present

## 2020-12-12 DIAGNOSIS — M79601 Pain in right arm: Secondary | ICD-10-CM | POA: Diagnosis not present

## 2020-12-12 DIAGNOSIS — I4891 Unspecified atrial fibrillation: Secondary | ICD-10-CM | POA: Diagnosis not present

## 2020-12-12 DIAGNOSIS — R0609 Other forms of dyspnea: Secondary | ICD-10-CM | POA: Diagnosis not present

## 2020-12-12 DIAGNOSIS — M79604 Pain in right leg: Secondary | ICD-10-CM | POA: Diagnosis not present

## 2020-12-12 DIAGNOSIS — M79605 Pain in left leg: Secondary | ICD-10-CM | POA: Diagnosis not present

## 2020-12-12 DIAGNOSIS — D571 Sickle-cell disease without crisis: Secondary | ICD-10-CM | POA: Diagnosis not present

## 2020-12-12 DIAGNOSIS — M79602 Pain in left arm: Secondary | ICD-10-CM | POA: Diagnosis not present

## 2020-12-13 DIAGNOSIS — D571 Sickle-cell disease without crisis: Secondary | ICD-10-CM | POA: Diagnosis not present

## 2020-12-13 DIAGNOSIS — D57819 Other sickle-cell disorders with crisis, unspecified: Secondary | ICD-10-CM | POA: Diagnosis not present

## 2020-12-13 DIAGNOSIS — Z992 Dependence on renal dialysis: Secondary | ICD-10-CM | POA: Diagnosis not present

## 2020-12-13 DIAGNOSIS — E876 Hypokalemia: Secondary | ICD-10-CM | POA: Diagnosis not present

## 2020-12-13 DIAGNOSIS — N186 End stage renal disease: Secondary | ICD-10-CM | POA: Diagnosis not present

## 2020-12-13 DIAGNOSIS — D57219 Sickle-cell/Hb-C disease with crisis, unspecified: Secondary | ICD-10-CM | POA: Diagnosis not present

## 2020-12-13 DIAGNOSIS — N2581 Secondary hyperparathyroidism of renal origin: Secondary | ICD-10-CM | POA: Diagnosis not present

## 2020-12-13 DIAGNOSIS — D631 Anemia in chronic kidney disease: Secondary | ICD-10-CM | POA: Diagnosis not present

## 2020-12-13 DIAGNOSIS — D509 Iron deficiency anemia, unspecified: Secondary | ICD-10-CM | POA: Diagnosis not present

## 2020-12-15 DIAGNOSIS — I158 Other secondary hypertension: Secondary | ICD-10-CM | POA: Diagnosis not present

## 2020-12-15 DIAGNOSIS — N186 End stage renal disease: Secondary | ICD-10-CM | POA: Diagnosis not present

## 2020-12-15 DIAGNOSIS — Z992 Dependence on renal dialysis: Secondary | ICD-10-CM | POA: Diagnosis not present

## 2020-12-17 DIAGNOSIS — D57819 Other sickle-cell disorders with crisis, unspecified: Secondary | ICD-10-CM | POA: Diagnosis not present

## 2020-12-17 DIAGNOSIS — D571 Sickle-cell disease without crisis: Secondary | ICD-10-CM | POA: Diagnosis not present

## 2020-12-17 DIAGNOSIS — N2581 Secondary hyperparathyroidism of renal origin: Secondary | ICD-10-CM | POA: Diagnosis not present

## 2020-12-17 DIAGNOSIS — N186 End stage renal disease: Secondary | ICD-10-CM | POA: Diagnosis not present

## 2020-12-17 DIAGNOSIS — D57219 Sickle-cell/Hb-C disease with crisis, unspecified: Secondary | ICD-10-CM | POA: Diagnosis not present

## 2020-12-17 DIAGNOSIS — Z992 Dependence on renal dialysis: Secondary | ICD-10-CM | POA: Diagnosis not present

## 2020-12-20 ENCOUNTER — Ambulatory Visit: Payer: HMO | Admitting: Pulmonary Disease

## 2020-12-20 DIAGNOSIS — E876 Hypokalemia: Secondary | ICD-10-CM | POA: Diagnosis not present

## 2020-12-20 DIAGNOSIS — N186 End stage renal disease: Secondary | ICD-10-CM | POA: Diagnosis not present

## 2020-12-20 DIAGNOSIS — D509 Iron deficiency anemia, unspecified: Secondary | ICD-10-CM | POA: Diagnosis not present

## 2020-12-20 DIAGNOSIS — D57819 Other sickle-cell disorders with crisis, unspecified: Secondary | ICD-10-CM | POA: Diagnosis not present

## 2020-12-20 DIAGNOSIS — Z992 Dependence on renal dialysis: Secondary | ICD-10-CM | POA: Diagnosis not present

## 2020-12-20 DIAGNOSIS — D631 Anemia in chronic kidney disease: Secondary | ICD-10-CM | POA: Diagnosis not present

## 2020-12-20 DIAGNOSIS — D571 Sickle-cell disease without crisis: Secondary | ICD-10-CM | POA: Diagnosis not present

## 2020-12-20 DIAGNOSIS — N2581 Secondary hyperparathyroidism of renal origin: Secondary | ICD-10-CM | POA: Diagnosis not present

## 2020-12-20 DIAGNOSIS — D57219 Sickle-cell/Hb-C disease with crisis, unspecified: Secondary | ICD-10-CM | POA: Diagnosis not present

## 2020-12-21 ENCOUNTER — Encounter: Payer: Self-pay | Admitting: Pulmonary Disease

## 2020-12-21 ENCOUNTER — Other Ambulatory Visit: Payer: Self-pay

## 2020-12-21 ENCOUNTER — Ambulatory Visit: Payer: HMO | Admitting: Pulmonary Disease

## 2020-12-21 VITALS — BP 122/64 | HR 96 | Ht 71.0 in | Wt 154.0 lb

## 2020-12-21 DIAGNOSIS — I272 Pulmonary hypertension, unspecified: Secondary | ICD-10-CM | POA: Diagnosis not present

## 2020-12-21 DIAGNOSIS — J449 Chronic obstructive pulmonary disease, unspecified: Secondary | ICD-10-CM

## 2020-12-21 MED ORDER — BREZTRI AEROSPHERE 160-9-4.8 MCG/ACT IN AERO: 2.0000 | INHALATION_SPRAY | Freq: Two times a day (BID) | RESPIRATORY_TRACT | 0 refills | Status: DC

## 2020-12-21 MED ORDER — BREZTRI AEROSPHERE 160-9-4.8 MCG/ACT IN AERO: 2.0000 | INHALATION_SPRAY | Freq: Two times a day (BID) | RESPIRATORY_TRACT | 6 refills | Status: DC

## 2020-12-21 NOTE — Patient Instructions (Addendum)
Start Breztri inhaler 2 puffs twice daily - rinse mouth after each use  Continue Albuterol inhaler 1-2 puffs every 4-6 hours as needed for shortness of breath  Please call in 3-4 weeks if you do not notice a difference with the new inhaler

## 2020-12-21 NOTE — Progress Notes (Signed)
Synopsis: Referred by Roderic Palau, NP for abnormal PFTs  Subjective:   PATIENT ID: Patrick [name]: male DOB: 1956/04/16, MRN: [id number]  Had episode a month ago woke up with shortness of breath and since had wheezing and dyspnea.   HPI  Chief Complaint  Patient presents with   COPD    3 month follow up.  Having trouble with shortness of breath and wheezing for the last month   Patrick [name] is a 65 year old male, former smoker and legally blind with sickle cell anemia, ESRD on HD, atrial fibrillation, pulmonary hypertension and non-ischemic cardiomyopathy who returns to pulmonary clinic for follow up.  He was started on anoro ellipta inahler and as needed albuterol at last visit. He reports some improvement in his shortness of breath but reports ongoing dyspnea and intermittent wheezing. He reports an episodes where he woke up with severe shortness of breath a month ago and has noticed increased frequency in his wheezing and shortness of breath.   He was ordered for overnight oximetry after last visit but I do not have these results.   OV 05/09/20: He reports progressive exertional dyspnea over the past year. He denies cough, wheezing or chest tightness with the dyspnea. He quit smoking in 2017 and has about a 10-15 pack year smoking history. He did grow up with second hand smoke exposure. He has not been on inhaler therapy in the past.    He had a hospital admission in 08/2019 for heart failure exacerbation and underwent DC- cardioversion in 09/2019 for atrial fibrillation but was found to be back in atrial fibrillation in 10/2019 that is being treated with rate control.   Most recent Echo shows EF 40-45% with moderately dilated LV. RV systolic function is mildly reduced and size is mildly enlarged. RVSP is 75.52mHg. Left and right atria are severely dilated.       Pulmonary function tests show FEV1/FVC ratio 58, FVC 2.98L (71%), FEV1 1.72L (53%) with 18% improvement  with bronchodilators. TLC 7.06L (98%), RV 4.38L (189%) and DLCO 53%.   He was previously on sildenafil for the pulmonary hypertension component but this was discontinued as he was started on Imdur in 01/2020 for heart failure.  Past Medical History:  Diagnosis Date   Blood dyscrasia    Blood transfusion    "I've had a bunch of them"   CHF (congestive heart failure) (HCC)    followed by hf clinic   Chronic kidney disease (CKD), stage III (moderate) (HCC)    [facility] [place]   AV fistula (right arm)   Gout    Headache(784.0)    "probably weekly" (01/13/2014)   Legally blind    Shortness of breath dyspnea    Sickle cell disease, type SS (HCC)    Swelling abdomen    Swelling of extremity, left    Swelling of extremity, right      Family History  Problem Relation Age of Onset   Alcohol abuse Mother    Liver disease Mother    Cirrhosis Mother    Heart attack Mother      Social History   Socioeconomic History   Marital status: Divorced    Spouse name: Not on file   Number of children: Not on file   Years of education: Not on file   Highest education level: Not on file  Occupational History   Not on file  Tobacco Use   Smoking status: Former    Packs/day: 0.25  Years: 41.00    Pack years: 10.25    Types: Cigarettes    Quit date: 12/08/2015    Years since quitting: 5.0   Smokeless tobacco: Never  Vaping Use   Vaping Use: Never used  Substance and Sexual Activity   Alcohol use: Yes    Alcohol/week: 3.0 standard drinks    Types: 1 Glasses of wine, 2 Cans of beer per week    Comment: occasional   Drug use: No   Sexual activity: Not Currently  Other Topics Concern   Not on file  Social History Narrative   Not on file   Social Determinants of Health   Financial Resource Strain: Not on file  Food Insecurity: Not on file  Transportation Needs: Not on file  Physical Activity: Not on file  Stress: Not on file  Social Connections: Not on file   Intimate Partner Violence: Not on file     No Known Allergies   Outpatient Medications Prior to Visit  Medication Sig Dispense Refill   acetaminophen (TYLENOL) 500 MG tablet Take 1,000 mg by mouth every 6 (six) hours as needed for moderate pain or headache.     albuterol (VENTOLIN HFA) 108 (90 Base) MCG/ACT inhaler Inhale 2 puffs into the lungs every 6 (six) hours as needed for wheezing or shortness of breath. 8 g 6   carvedilol (COREG) 12.5 MG tablet TAKE 1 TABLET BY MOUTH 2 (TWO) TIMES DAILY WITH A MEAL. 180 tablet 2   diphenhydramine-acetaminophen (TYLENOL PM) 25-500 MG TABS tablet Take 2 tablets by mouth at bedtime as needed (sleep).     ELIQUIS 5 MG TABS tablet TAKE 1 TABLET BY MOUTH TWICE A DAY (Patient taking differently: Take 5 mg by mouth 2 (two) times daily.) 60 tablet 11   folic acid (FOLVITE) 1 MG tablet TAKE 1 TABLET BY MOUTH EVERY DAY (Patient taking differently: Take 1 mg by mouth daily. TAKE 1 TABLET BY MOUTH EVERY DAY) 90 tablet 3   gabapentin (NEURONTIN) 100 MG capsule TAKE 1 CAPSULE BY MOUTH EVERYDAY AT BEDTIME 30 capsule 3   hydroxyurea (HYDREA) 500 MG capsule Take 1 capsule (500 mg total) by mouth daily. TAKE 1 CAPSULE BY MOUTH EVERY DAY WITH FOOD TO MINIMIZE GI SIDE EFFECTS 90 capsule 1   isosorbide mononitrate (IMDUR) 30 MG 24 hr tablet Take 1 tablet (30 mg total) by mouth daily. DO NOT TAKE AM ON DIALYSIS DAYS. 30 tablet 1   lidocaine-prilocaine (EMLA) cream Apply 1 application topically daily as needed (port access).      metolazone (ZAROXOLYN) 5 MG tablet Take 1 tablet (5 mg total) by mouth daily as needed. (Patient taking differently: Take 5 mg by mouth daily as needed (fluid).) 10 tablet 0   Oxycodone HCl 10 MG TABS Take 1 tablet (10 mg total) by mouth every 4 (four) hours as needed (pain). 90 tablet 0   tiZANidine (ZANAFLEX) 4 MG tablet TAKE 1/2 TABLET BY MOUTH EVERY 8 HOURS AS NEEDED FOR MUSCLE SPASMS 30 tablet 1   torsemide (DEMADEX) 20 MG tablet Take 80 mg by  mouth daily.     Vitamin D, Ergocalciferol, (DRISDOL) 1.25 MG (50000 UNIT) CAPS capsule TAKE 1 CAPSULE BY MOUTH ONE TIME PER WEEK 12 capsule 1   allopurinol (ZYLOPRIM) 100 MG tablet TAKE 1 TABLET BY MOUTH EVERY DAY 30 tablet 1   hydrALAZINE (APRESOLINE) 25 MG tablet Take 1 tablet (25 mg total) by mouth in the morning and at bedtime. DO NOT TAKE AM DOSES ON  DIALYSIS DAYS (Patient taking differently: Take 25 mg by mouth See admin instructions. Take 1 tablet by mouth twice daily all days, EXCEPT on dialysis day (T, Th, Sat) take in the evening only) 60 tablet 3   Facility-Administered Medications Prior to Visit  Medication Dose Route Frequency Provider Last Rate Last Admin   0.9 %  sodium chloride infusion  250 mL Intravenous PRN Elam Dutch, MD       sodium chloride flush (NS) 0.9 % injection 3 mL  3 mL Intravenous Q12H Fields, Jessy Oto, MD       sodium chloride flush (NS) 0.9 % injection 3 mL  3 mL Intravenous PRN Elam Dutch, MD        Review of Systems  Constitutional:  Negative for chills, fever and weight loss.  HENT:  Negative for congestion, sinus pain and sore throat.   Eyes:        Macular degeneration, legally blind  Respiratory:  Positive for shortness of breath and wheezing. Negative for cough, hemoptysis and sputum production.   Cardiovascular:  Negative for chest pain, leg swelling and PND.  Gastrointestinal:  Negative for abdominal pain, heartburn, nausea and vomiting.  Genitourinary:  Negative for hematuria.  Musculoskeletal:  Negative for joint pain.  Skin:  Negative for rash.  Neurological:  Negative for dizziness, weakness and headaches.  Endo/Heme/Allergies:  Does not bruise/bleed easily.  Objective:   Vitals:   12/21/20 1411  BP: 122/64  Pulse: 96  SpO2: 98%  Weight: 154 lb (69.9 kg)  Height: '5\' 11"'$  (1.803 m)     Physical Exam Constitutional:      General: He is not in acute distress.    Appearance: He is normal weight. He is not ill-appearing.   HENT:     Head: Normocephalic and atraumatic.     Nose: Nose normal.     Mouth/Throat:     Mouth: Mucous membranes are moist.     Pharynx: Oropharynx is clear.  Eyes:     General: No scleral icterus.    Conjunctiva/sclera: Conjunctivae normal.     Pupils: Pupils are equal, round, and reactive to light.  Cardiovascular:     Rate and Rhythm: Normal rate. Rhythm irregular.     Pulses: Normal pulses.     Heart sounds: Normal heart sounds.  Pulmonary:     Effort: Pulmonary effort is normal.     Breath sounds: Decreased air movement (throughout) present. Decreased breath sounds present. No wheezing, rhonchi or rales.  Abdominal:     General: Bowel sounds are normal.     Palpations: Abdomen is soft.  Musculoskeletal:     Cervical back: Neck supple.     Right lower leg: No edema.     Left lower leg: No edema.  Lymphadenopathy:     Cervical: No cervical adenopathy.  Skin:    General: Skin is warm and dry.  Neurological:     General: No focal deficit present.     Mental Status: He is alert.  Psychiatric:        Mood and Affect: Mood normal.        Behavior: Behavior normal.        Thought Content: Thought content normal.        Judgment: Judgment normal.   CBC    Component Value Date/Time   WBC 4.8 08/05/2020 0326   RBC 1.96 (L) 08/05/2020 0326   HGB 9.5 (L) 08/08/2020 0621   HGB 9.1 (L) 05/24/2020 1119  HCT 28.0 (L) 08/08/2020 0621   HCT 25.6 (L) 05/24/2020 1119   PLT 85 (L) 08/05/2020 0326   PLT 88 (LL) 05/24/2020 1119   MCV 106.6 (H) 08/05/2020 0326   MCV 124 (H) 05/24/2020 1119   MCH 39.8 (H) 08/05/2020 0326   MCHC 37.3 (H) 08/05/2020 0326   RDW 20.7 (H) 08/05/2020 0326   RDW 14.6 05/24/2020 1119   LYMPHSABS 0.7 08/04/2020 0446   LYMPHSABS 0.9 05/24/2020 1119   MONOABS 0.8 08/04/2020 0446   EOSABS 0.1 08/04/2020 0446   EOSABS 0.1 05/24/2020 1119   BASOSABS 0.0 08/04/2020 0446   BASOSABS 0.1 05/24/2020 1119   BMP Latest Ref Rng & Units 08/08/2020 08/05/2020  08/02/2020  Glucose 70 - 99 mg/dL 89 105(H) 89  BUN 8 - 23 mg/dL 43(H) 28(H) 98(H)  Creatinine 0.61 - 1.24 mg/dL 6.40(H) 4.46(H) 7.09(H)  BUN/Creat Ratio 10 - 24 - - -  Sodium 135 - 145 mmol/L 138 135 133(L)  Potassium 3.5 - 5.1 mmol/L 4.1 3.7 4.9  Chloride 98 - 111 mmol/L 96(L) 98 95(L)  CO2 22 - 32 mmol/L - 27 22  Calcium 8.9 - 10.3 mg/dL - 7.6(L) 8.3(L)   Chest imaging: CXR 08/01/20 Mild cardiomegaly with aortic atherosclerosis. No focal opacity or pleural effusion. No pneumothorax.  CT Chest 05/16/20 Mediastinum/Nodes: Thyroid unremarkable. No pathologic adenopathy within the thorax. The esophagus is unremarkable.   Lungs/Pleura: Lungs are clear. No pleural effusion or pneumothorax. No central obstructing lesion  PFT: PFT Results Latest Ref Rng & Units 12/09/2019  FVC-Pre L 2.74  FVC-Predicted Pre % 66  FVC-Post L 2.98  FVC-Predicted Post % 71  Pre FEV1/FVC % % 53  Post FEV1/FCV % % 58  FEV1-Pre L 1.45  FEV1-Predicted Pre % 44  FEV1-Post L 1.72  DLCO uncorrected ml/min/mmHg 15.26  DLCO [facility]% % 53  DLVA Predicted % 70  TLC L 7.06  TLC % Predicted % 98  RV % Predicted % 189    Echo: 02/17/20  1. Left ventricular ejection fraction, by estimation, is 40 to 45%. The  left ventricle has mildly decreased function. The left ventricle  demonstrates global hypokinesis. The left ventricular internal cavity size  was moderately dilated. There is moderate   left ventricular hypertrophy. Left ventricular diastolic parameters are  indeterminate.   2. Right ventricular systolic function is mildly reduced. The right  ventricular size is mildly enlarged. There is severely elevated pulmonary  artery systolic pressure. The estimated right ventricular systolic  pressure is 0000000 mmHg.   3. Left atrial size was severely dilated.   4. Right atrial size was severely dilated.   5. The mitral valve is grossly normal. Moderate to severe mitral valve  regurgitation.   6. Tricuspid valve  regurgitation is severe.   7. The aortic valve is tricuspid. Aortic valve regurgitation is trivial.  No aortic stenosis is present.   8. The inferior vena cava is dilated in size with <50% respiratory  variability, suggesting right atrial pressure of 15 mmHg.   Comparison(s): A prior study was performed on 09/12/19. Prior images  reviewed side by side. LVEF has improved slightly.    Assessment & Plan:   Chronic obstructive pulmonary disease, unspecified COPD type ([place]) - Plan: Budeson-Glycopyrrol-Formoterol (BREZTRI AEROSPHERE) 160-9-4.8 MCG/ACT AERO, DISCONTINUED: Budeson-Glycopyrrol-Formoterol (BREZTRI AEROSPHERE) 160-9-4.8 MCG/ACT AERO  Pulmonary hypertension (HCC)  Discussion: Patrick [name] is a 65 year old male, former smoker and legally blind with sickle cell anemia, ESRD on HD, atrial fibrillation, pulmonary hypertension and non-ischemic  cardiomyopathy who returns to pulmonary clinic for follow up.   We will transition him from anoro ellipta to breztri inhaler threapy, 2 puffs twice daily given his dyspnea and wheezing over the past month. He has moderately severe obstructive defect with evidence of air trapping and significant response to bronchodilators. He also has moderate decrease in his diffusion capacity. His CT scan did not show concern for emphysematous changes so his PFT pattern could be a result of pulmonary hypertension and moderate/severe persistent asthma vs COPD.   We will follow up if he had an overnight oximitry test performed, if not we will then re-order this study to be done given his heart failure and pulmonary hypertension.  In regards to his pulmonary hypertension, this is mainly in the setting of group 2 disease (heart failure) with possible contributions from group 3 (COPD) and group 5 (sickle cell anemia). His volume status appears well controlled at this time. He receives dialysis T/Th/Sat.   Follow up in 3 months.   Freda Jackson, MD [facility] Office: [phone]      Current Outpatient Medications:    acetaminophen (TYLENOL) 500 MG tablet, Take 1,000 mg by mouth every 6 (six) hours as needed for moderate pain or headache., Disp: , Rfl:    albuterol (VENTOLIN HFA) 108 (90 Base) MCG/ACT inhaler, Inhale 2 puffs into the lungs every 6 (six) hours as needed for wheezing or shortness of breath., Disp: 8 g, Rfl: 6   carvedilol (COREG) 12.5 MG tablet, TAKE 1 TABLET BY MOUTH 2 (TWO) TIMES DAILY WITH A MEAL., Disp: 180 tablet, Rfl: 2   diphenhydramine-acetaminophen (TYLENOL PM) 25-500 MG TABS tablet, Take 2 tablets by mouth at bedtime as needed (sleep)., Disp: , Rfl:    ELIQUIS 5 MG TABS tablet, TAKE 1 TABLET BY MOUTH TWICE A DAY (Patient taking differently: Take 5 mg by mouth 2 (two) times daily.), Disp: 60 tablet, Rfl: 11   folic acid (FOLVITE) 1 MG tablet, TAKE 1 TABLET BY MOUTH EVERY DAY (Patient taking differently: Take 1 mg by mouth daily. TAKE 1 TABLET BY MOUTH EVERY DAY), Disp: 90 tablet, Rfl: 3   gabapentin (NEURONTIN) 100 MG capsule, TAKE 1 CAPSULE BY MOUTH EVERYDAY AT BEDTIME, Disp: 30 capsule, Rfl: 3   hydroxyurea (HYDREA) 500 MG capsule, Take 1 capsule (500 mg total) by mouth daily. TAKE 1 CAPSULE BY MOUTH EVERY DAY WITH FOOD TO MINIMIZE GI SIDE EFFECTS, Disp: 90 capsule, Rfl: 1   isosorbide mononitrate (IMDUR) 30 MG 24 hr tablet, Take 1 tablet (30 mg total) by mouth daily. DO NOT TAKE AM ON DIALYSIS DAYS., Disp: 30 tablet, Rfl: 1   lidocaine-prilocaine (EMLA) cream, Apply 1 application topically daily as needed (port access). , Disp: , Rfl:    metolazone (ZAROXOLYN) 5 MG tablet, Take 1 tablet (5 mg total) by mouth daily as needed. (Patient taking differently: Take 5 mg by mouth daily as needed (fluid).), Disp: 10 tablet, Rfl: 0   Oxycodone HCl 10 MG TABS, Take 1 tablet (10 mg total) by mouth every 4 (four) hours as needed (pain)., Disp: 90 tablet, Rfl: 0   tiZANidine (ZANAFLEX) 4 MG tablet, TAKE 1/2 TABLET  BY MOUTH EVERY 8 HOURS AS NEEDED FOR MUSCLE SPASMS, Disp: 30 tablet, Rfl: 1   torsemide (DEMADEX) 20 MG tablet, Take 80 mg by mouth daily., Disp: , Rfl:    Vitamin D, Ergocalciferol, (DRISDOL) 1.25 MG (50000 UNIT) CAPS capsule, TAKE 1 CAPSULE BY MOUTH ONE TIME PER WEEK, Disp:  12 capsule, Rfl: 1   allopurinol (ZYLOPRIM) 100 MG tablet, TAKE 1 TABLET BY MOUTH EVERY DAY, Disp: 30 tablet, Rfl: 1   Budeson-Glycopyrrol-Formoterol (BREZTRI AEROSPHERE) 160-9-4.8 MCG/ACT AERO, Inhale 2 puffs into the lungs in the morning and at bedtime., Disp: 10.7 g, Rfl: 0   hydrALAZINE (APRESOLINE) 25 MG tablet, Take 1 tablet (25 mg total) by mouth in the morning and at bedtime. DO NOT TAKE AM DOSES ON DIALYSIS DAYS (Patient taking differently: Take 25 mg by mouth See admin instructions. Take 1 tablet by mouth twice daily all days, EXCEPT on dialysis day (T, Th, Sat) take in the evening only), Disp: 60 tablet, Rfl: 3  Current Facility-Administered Medications:    0.9 %  sodium chloride infusion, 250 mL, Intravenous, PRN, Fields, Charles E, MD   sodium chloride flush (NS) 0.9 % injection 3 mL, 3 mL, Intravenous, Q12H, Fields, Charles E, MD   sodium chloride flush (NS) 0.9 % injection 3 mL, 3 mL, Intravenous, PRN, Fields, Jessy Oto, MD

## 2020-12-25 ENCOUNTER — Other Ambulatory Visit: Payer: Self-pay | Admitting: Family Medicine

## 2020-12-27 DIAGNOSIS — D57819 Other sickle-cell disorders with crisis, unspecified: Secondary | ICD-10-CM | POA: Diagnosis not present

## 2020-12-27 DIAGNOSIS — E876 Hypokalemia: Secondary | ICD-10-CM | POA: Diagnosis not present

## 2020-12-27 DIAGNOSIS — Z992 Dependence on renal dialysis: Secondary | ICD-10-CM | POA: Diagnosis not present

## 2020-12-27 DIAGNOSIS — D57219 Sickle-cell/Hb-C disease with crisis, unspecified: Secondary | ICD-10-CM | POA: Diagnosis not present

## 2020-12-27 DIAGNOSIS — D631 Anemia in chronic kidney disease: Secondary | ICD-10-CM | POA: Diagnosis not present

## 2020-12-27 DIAGNOSIS — N2581 Secondary hyperparathyroidism of renal origin: Secondary | ICD-10-CM | POA: Diagnosis not present

## 2020-12-27 DIAGNOSIS — D571 Sickle-cell disease without crisis: Secondary | ICD-10-CM | POA: Diagnosis not present

## 2020-12-27 DIAGNOSIS — N186 End stage renal disease: Secondary | ICD-10-CM | POA: Diagnosis not present

## 2020-12-27 DIAGNOSIS — D509 Iron deficiency anemia, unspecified: Secondary | ICD-10-CM | POA: Diagnosis not present

## 2021-01-03 DIAGNOSIS — D57819 Other sickle-cell disorders with crisis, unspecified: Secondary | ICD-10-CM | POA: Diagnosis not present

## 2021-01-03 DIAGNOSIS — N186 End stage renal disease: Secondary | ICD-10-CM | POA: Diagnosis not present

## 2021-01-03 DIAGNOSIS — D57219 Sickle-cell/Hb-C disease with crisis, unspecified: Secondary | ICD-10-CM | POA: Diagnosis not present

## 2021-01-03 DIAGNOSIS — D509 Iron deficiency anemia, unspecified: Secondary | ICD-10-CM | POA: Diagnosis not present

## 2021-01-03 DIAGNOSIS — D571 Sickle-cell disease without crisis: Secondary | ICD-10-CM | POA: Diagnosis not present

## 2021-01-03 DIAGNOSIS — Z992 Dependence on renal dialysis: Secondary | ICD-10-CM | POA: Diagnosis not present

## 2021-01-03 DIAGNOSIS — D631 Anemia in chronic kidney disease: Secondary | ICD-10-CM | POA: Diagnosis not present

## 2021-01-03 DIAGNOSIS — N2581 Secondary hyperparathyroidism of renal origin: Secondary | ICD-10-CM | POA: Diagnosis not present

## 2021-01-04 ENCOUNTER — Other Ambulatory Visit: Payer: Self-pay

## 2021-01-04 ENCOUNTER — Non-Acute Institutional Stay (HOSPITAL_COMMUNITY)
Admission: RE | Admit: 2021-01-04 | Discharge: 2021-01-04 | Disposition: A | Discharge status: Home / Self Care | Payer: HMO | Source: Ambulatory Visit | Attending: Internal Medicine | Admitting: Internal Medicine

## 2021-01-04 DIAGNOSIS — D649 Anemia, unspecified: Secondary | ICD-10-CM | POA: Insufficient documentation

## 2021-01-04 LAB — PREPARE RBC (CROSSMATCH)

## 2021-01-04 MED ORDER — SODIUM CHLORIDE 0.9% IV SOLUTION: Freq: Once | INTRAVENOUS | Status: AC

## 2021-01-04 NOTE — Progress Notes (Signed)
Patient received via 1 unit of packed red blood cells as ordered by Dr. Corliss Parish . Type and screen was done before transfusion. Pre meds not required per orders. Consent for blood products obtained, reviewed side effects with pt, verbalized understanding. Tolerated well, vitals stable, discharge instructions given, verbalized understanding. Patient alert, oriented and ambulatory at the time of discharge.

## 2021-01-05 LAB — TYPE AND SCREEN
ABO/RH(D): AB POS
Antibody Screen: NEGATIVE
Unit division: 0

## 2021-01-05 LAB — BPAM RBC
Blood Product Expiration Date: 202208122359
ISSUE DATE / TIME: 202207201258
Unit Type and Rh: 6200

## 2021-01-10 DIAGNOSIS — E876 Hypokalemia: Secondary | ICD-10-CM | POA: Diagnosis not present

## 2021-01-10 DIAGNOSIS — D57219 Sickle-cell/Hb-C disease with crisis, unspecified: Secondary | ICD-10-CM | POA: Diagnosis not present

## 2021-01-10 DIAGNOSIS — Z992 Dependence on renal dialysis: Secondary | ICD-10-CM | POA: Diagnosis not present

## 2021-01-10 DIAGNOSIS — D57819 Other sickle-cell disorders with crisis, unspecified: Secondary | ICD-10-CM | POA: Diagnosis not present

## 2021-01-10 DIAGNOSIS — D509 Iron deficiency anemia, unspecified: Secondary | ICD-10-CM | POA: Diagnosis not present

## 2021-01-10 DIAGNOSIS — D571 Sickle-cell disease without crisis: Secondary | ICD-10-CM | POA: Diagnosis not present

## 2021-01-10 DIAGNOSIS — N186 End stage renal disease: Secondary | ICD-10-CM | POA: Diagnosis not present

## 2021-01-10 DIAGNOSIS — D631 Anemia in chronic kidney disease: Secondary | ICD-10-CM | POA: Diagnosis not present

## 2021-01-10 DIAGNOSIS — N2581 Secondary hyperparathyroidism of renal origin: Secondary | ICD-10-CM | POA: Diagnosis not present

## 2021-01-11 ENCOUNTER — Encounter: Payer: Self-pay | Admitting: Pulmonary Disease

## 2021-01-15 DIAGNOSIS — Z992 Dependence on renal dialysis: Secondary | ICD-10-CM | POA: Diagnosis not present

## 2021-01-15 DIAGNOSIS — N186 End stage renal disease: Secondary | ICD-10-CM | POA: Diagnosis not present

## 2021-01-15 DIAGNOSIS — I158 Other secondary hypertension: Secondary | ICD-10-CM | POA: Diagnosis not present

## 2021-01-17 DIAGNOSIS — D509 Iron deficiency anemia, unspecified: Secondary | ICD-10-CM | POA: Diagnosis not present

## 2021-01-17 DIAGNOSIS — D571 Sickle-cell disease without crisis: Secondary | ICD-10-CM | POA: Diagnosis not present

## 2021-01-17 DIAGNOSIS — E876 Hypokalemia: Secondary | ICD-10-CM | POA: Diagnosis not present

## 2021-01-17 DIAGNOSIS — N2581 Secondary hyperparathyroidism of renal origin: Secondary | ICD-10-CM | POA: Diagnosis not present

## 2021-01-17 DIAGNOSIS — D631 Anemia in chronic kidney disease: Secondary | ICD-10-CM | POA: Diagnosis not present

## 2021-01-17 DIAGNOSIS — Z992 Dependence on renal dialysis: Secondary | ICD-10-CM | POA: Diagnosis not present

## 2021-01-17 DIAGNOSIS — N186 End stage renal disease: Secondary | ICD-10-CM | POA: Diagnosis not present

## 2021-01-17 DIAGNOSIS — D57219 Sickle-cell/Hb-C disease with crisis, unspecified: Secondary | ICD-10-CM | POA: Diagnosis not present

## 2021-01-17 DIAGNOSIS — D57819 Other sickle-cell disorders with crisis, unspecified: Secondary | ICD-10-CM | POA: Diagnosis not present

## 2021-01-20 ENCOUNTER — Other Ambulatory Visit: Payer: Self-pay | Admitting: Family Medicine

## 2021-01-20 DIAGNOSIS — M545 Low back pain, unspecified: Secondary | ICD-10-CM

## 2021-01-23 ENCOUNTER — Telehealth (HOSPITAL_COMMUNITY): Payer: Self-pay | Admitting: Family Medicine

## 2021-01-23 ENCOUNTER — Other Ambulatory Visit: Payer: Self-pay | Admitting: Family Medicine

## 2021-01-23 NOTE — Telephone Encounter (Signed)
2 Med refill requests for   1)Oxycodone HCl 10 MG TABS [id number]    And also 2) the one for his BACK SPASM. Pt says he does not know the name of the medication since he threw it out after he was done w/ it.  Pharmacy  [id number] [place] [place] [id number] Phone:  [phone] Fax:  [phone]

## 2021-01-24 DIAGNOSIS — D631 Anemia in chronic kidney disease: Secondary | ICD-10-CM | POA: Diagnosis not present

## 2021-01-24 DIAGNOSIS — D571 Sickle-cell disease without crisis: Secondary | ICD-10-CM | POA: Diagnosis not present

## 2021-01-24 DIAGNOSIS — D509 Iron deficiency anemia, unspecified: Secondary | ICD-10-CM | POA: Diagnosis not present

## 2021-01-24 DIAGNOSIS — Z992 Dependence on renal dialysis: Secondary | ICD-10-CM | POA: Diagnosis not present

## 2021-01-24 DIAGNOSIS — E876 Hypokalemia: Secondary | ICD-10-CM | POA: Diagnosis not present

## 2021-01-24 DIAGNOSIS — N2581 Secondary hyperparathyroidism of renal origin: Secondary | ICD-10-CM | POA: Diagnosis not present

## 2021-01-24 DIAGNOSIS — N186 End stage renal disease: Secondary | ICD-10-CM | POA: Diagnosis not present

## 2021-01-24 DIAGNOSIS — D57819 Other sickle-cell disorders with crisis, unspecified: Secondary | ICD-10-CM | POA: Diagnosis not present

## 2021-01-24 DIAGNOSIS — D57219 Sickle-cell/Hb-C disease with crisis, unspecified: Secondary | ICD-10-CM | POA: Diagnosis not present

## 2021-01-30 ENCOUNTER — Telehealth: Payer: Self-pay

## 2021-01-30 NOTE — Telephone Encounter (Signed)
Hydroxyurea

## 2021-01-31 ENCOUNTER — Other Ambulatory Visit: Payer: Self-pay | Admitting: Family Medicine

## 2021-01-31 DIAGNOSIS — Z992 Dependence on renal dialysis: Secondary | ICD-10-CM | POA: Diagnosis not present

## 2021-01-31 DIAGNOSIS — D57219 Sickle-cell/Hb-C disease with crisis, unspecified: Secondary | ICD-10-CM | POA: Diagnosis not present

## 2021-01-31 DIAGNOSIS — D57819 Other sickle-cell disorders with crisis, unspecified: Secondary | ICD-10-CM | POA: Diagnosis not present

## 2021-01-31 DIAGNOSIS — D509 Iron deficiency anemia, unspecified: Secondary | ICD-10-CM | POA: Diagnosis not present

## 2021-01-31 DIAGNOSIS — N2581 Secondary hyperparathyroidism of renal origin: Secondary | ICD-10-CM | POA: Diagnosis not present

## 2021-01-31 DIAGNOSIS — D631 Anemia in chronic kidney disease: Secondary | ICD-10-CM | POA: Diagnosis not present

## 2021-01-31 DIAGNOSIS — D571 Sickle-cell disease without crisis: Secondary | ICD-10-CM

## 2021-01-31 DIAGNOSIS — N186 End stage renal disease: Secondary | ICD-10-CM | POA: Diagnosis not present

## 2021-01-31 MED ORDER — HYDROXYUREA 500 MG PO CAPS: 500.0000 mg | ORAL_CAPSULE | Freq: Every day | ORAL | 1 refills | Status: DC

## 2021-01-31 NOTE — Progress Notes (Signed)
Meds ordered this encounter  Medications   hydroxyurea (HYDREA) 500 MG capsule    Sig: Take 1 capsule (500 mg total) by mouth daily. TAKE 1 CAPSULE BY MOUTH EVERY DAY WITH FOOD TO MINIMIZE GI SIDE EFFECTS    Dispense:  90 capsule    Refill:  1    Order Specific Question:   Supervising Provider    Answer:   [name] [id number]

## 2021-02-07 DIAGNOSIS — D57819 Other sickle-cell disorders with crisis, unspecified: Secondary | ICD-10-CM | POA: Diagnosis not present

## 2021-02-07 DIAGNOSIS — N186 End stage renal disease: Secondary | ICD-10-CM | POA: Diagnosis not present

## 2021-02-07 DIAGNOSIS — D509 Iron deficiency anemia, unspecified: Secondary | ICD-10-CM | POA: Diagnosis not present

## 2021-02-07 DIAGNOSIS — D571 Sickle-cell disease without crisis: Secondary | ICD-10-CM | POA: Diagnosis not present

## 2021-02-07 DIAGNOSIS — D631 Anemia in chronic kidney disease: Secondary | ICD-10-CM | POA: Diagnosis not present

## 2021-02-07 DIAGNOSIS — D57219 Sickle-cell/Hb-C disease with crisis, unspecified: Secondary | ICD-10-CM | POA: Diagnosis not present

## 2021-02-07 DIAGNOSIS — Z992 Dependence on renal dialysis: Secondary | ICD-10-CM | POA: Diagnosis not present

## 2021-02-07 DIAGNOSIS — N2581 Secondary hyperparathyroidism of renal origin: Secondary | ICD-10-CM | POA: Diagnosis not present

## 2021-02-07 DIAGNOSIS — E876 Hypokalemia: Secondary | ICD-10-CM | POA: Diagnosis not present

## 2021-02-14 ENCOUNTER — Other Ambulatory Visit: Payer: Self-pay | Admitting: Internal Medicine

## 2021-02-14 DIAGNOSIS — D571 Sickle-cell disease without crisis: Secondary | ICD-10-CM | POA: Diagnosis not present

## 2021-02-14 DIAGNOSIS — D57819 Other sickle-cell disorders with crisis, unspecified: Secondary | ICD-10-CM | POA: Diagnosis not present

## 2021-02-14 DIAGNOSIS — N2581 Secondary hyperparathyroidism of renal origin: Secondary | ICD-10-CM | POA: Diagnosis not present

## 2021-02-14 DIAGNOSIS — D509 Iron deficiency anemia, unspecified: Secondary | ICD-10-CM | POA: Diagnosis not present

## 2021-02-14 DIAGNOSIS — N186 End stage renal disease: Secondary | ICD-10-CM | POA: Diagnosis not present

## 2021-02-14 DIAGNOSIS — D631 Anemia in chronic kidney disease: Secondary | ICD-10-CM | POA: Diagnosis not present

## 2021-02-14 DIAGNOSIS — Z992 Dependence on renal dialysis: Secondary | ICD-10-CM | POA: Diagnosis not present

## 2021-02-14 DIAGNOSIS — D57219 Sickle-cell/Hb-C disease with crisis, unspecified: Secondary | ICD-10-CM | POA: Diagnosis not present

## 2021-02-15 DIAGNOSIS — I158 Other secondary hypertension: Secondary | ICD-10-CM | POA: Diagnosis not present

## 2021-02-15 DIAGNOSIS — N186 End stage renal disease: Secondary | ICD-10-CM | POA: Diagnosis not present

## 2021-02-15 DIAGNOSIS — Z992 Dependence on renal dialysis: Secondary | ICD-10-CM | POA: Diagnosis not present

## 2021-02-16 DIAGNOSIS — D57219 Sickle-cell/Hb-C disease with crisis, unspecified: Secondary | ICD-10-CM | POA: Diagnosis not present

## 2021-02-16 DIAGNOSIS — Z992 Dependence on renal dialysis: Secondary | ICD-10-CM | POA: Diagnosis not present

## 2021-02-16 DIAGNOSIS — N2581 Secondary hyperparathyroidism of renal origin: Secondary | ICD-10-CM | POA: Diagnosis not present

## 2021-02-16 DIAGNOSIS — D57819 Other sickle-cell disorders with crisis, unspecified: Secondary | ICD-10-CM | POA: Diagnosis not present

## 2021-02-16 DIAGNOSIS — N186 End stage renal disease: Secondary | ICD-10-CM | POA: Diagnosis not present

## 2021-02-16 DIAGNOSIS — D571 Sickle-cell disease without crisis: Secondary | ICD-10-CM | POA: Diagnosis not present

## 2021-02-18 ENCOUNTER — Other Ambulatory Visit (HOSPITAL_COMMUNITY): Payer: Self-pay | Admitting: Internal Medicine

## 2021-02-21 ENCOUNTER — Other Ambulatory Visit: Payer: Self-pay | Admitting: Nurse Practitioner

## 2021-02-21 DIAGNOSIS — D57819 Other sickle-cell disorders with crisis, unspecified: Secondary | ICD-10-CM | POA: Diagnosis not present

## 2021-02-21 DIAGNOSIS — Z992 Dependence on renal dialysis: Secondary | ICD-10-CM | POA: Diagnosis not present

## 2021-02-21 DIAGNOSIS — D571 Sickle-cell disease without crisis: Secondary | ICD-10-CM | POA: Diagnosis not present

## 2021-02-21 DIAGNOSIS — N2581 Secondary hyperparathyroidism of renal origin: Secondary | ICD-10-CM | POA: Diagnosis not present

## 2021-02-21 DIAGNOSIS — N186 End stage renal disease: Secondary | ICD-10-CM | POA: Diagnosis not present

## 2021-02-21 DIAGNOSIS — D509 Iron deficiency anemia, unspecified: Secondary | ICD-10-CM | POA: Diagnosis not present

## 2021-02-21 DIAGNOSIS — D57219 Sickle-cell/Hb-C disease with crisis, unspecified: Secondary | ICD-10-CM | POA: Diagnosis not present

## 2021-02-22 ENCOUNTER — Telehealth: Payer: Self-pay

## 2021-02-22 ENCOUNTER — Other Ambulatory Visit: Payer: Self-pay | Admitting: Family Medicine

## 2021-02-22 DIAGNOSIS — M545 Low back pain, unspecified: Secondary | ICD-10-CM

## 2021-02-22 DIAGNOSIS — G894 Chronic pain syndrome: Secondary | ICD-10-CM

## 2021-02-22 MED ORDER — ALLOPURINOL 100 MG PO TABS: 100.0000 mg | ORAL_TABLET | Freq: Every day | ORAL | 1 refills | Status: DC

## 2021-02-22 MED ORDER — OXYCODONE HCL 10 MG PO TABS: 10.0000 mg | ORAL_TABLET | ORAL | 0 refills | Status: DC | PRN

## 2021-02-22 MED ORDER — TIZANIDINE HCL 4 MG PO TABS: ORAL_TABLET | ORAL | 1 refills | Status: DC

## 2021-02-22 NOTE — Telephone Encounter (Signed)
Allopurinol Carvedilol Oxycodone  Back Spasm med also

## 2021-02-22 NOTE — Progress Notes (Signed)
Reviewed PDMP substance reporting system prior to prescribing opiate medications. No inconsistencies noted.    Meds ordered this encounter  Medications   Oxycodone HCl 10 MG TABS    Sig: Take 1 tablet (10 mg total) by mouth every 4 (four) hours as needed (pain).    Dispense:  90 tablet    Refill:  0    Order Specific Question:   Supervising Provider    Answer:   [name] [id number]   allopurinol (ZYLOPRIM) 100 MG tablet    Sig: Take 1 tablet (100 mg total) by mouth daily.    Dispense:  30 tablet    Refill:  1    Order Specific Question:   Supervising Provider    Answer:   [name] [id number]   tiZANidine (ZANAFLEX) 4 MG tablet    Sig: TAKE 0.5 TABLET BY MOUTH EVERY 8 HOURS AS NEEDED FOR MUSCLE SPASMS    Dispense:  30 tablet    Refill:  1    Order Specific Question:   Supervising Provider    Answer:   [name] [id number]     [name]  APRN, MSN, FNP-C [facility] [address] [place], [place] [address] [phone]

## 2021-02-24 ENCOUNTER — Telehealth: Payer: Self-pay

## 2021-02-24 NOTE — Telephone Encounter (Signed)
Oxycodone  Back Spasms medication

## 2021-02-28 ENCOUNTER — Other Ambulatory Visit: Payer: Self-pay

## 2021-02-28 ENCOUNTER — Ambulatory Visit (INDEPENDENT_AMBULATORY_CARE_PROVIDER_SITE_OTHER): Payer: HMO | Admitting: Family Medicine

## 2021-02-28 ENCOUNTER — Encounter: Payer: Self-pay | Admitting: Family Medicine

## 2021-02-28 VITALS — BP 151/80 | HR 96 | Temp 98.1°F | Ht 71.0 in | Wt 154.0 lb

## 2021-02-28 DIAGNOSIS — N2581 Secondary hyperparathyroidism of renal origin: Secondary | ICD-10-CM | POA: Diagnosis not present

## 2021-02-28 DIAGNOSIS — D631 Anemia in chronic kidney disease: Secondary | ICD-10-CM | POA: Diagnosis not present

## 2021-02-28 DIAGNOSIS — D509 Iron deficiency anemia, unspecified: Secondary | ICD-10-CM | POA: Diagnosis not present

## 2021-02-28 DIAGNOSIS — D57819 Other sickle-cell disorders with crisis, unspecified: Secondary | ICD-10-CM | POA: Diagnosis not present

## 2021-02-28 DIAGNOSIS — D57219 Sickle-cell/Hb-C disease with crisis, unspecified: Secondary | ICD-10-CM | POA: Diagnosis not present

## 2021-02-28 DIAGNOSIS — Z23 Encounter for immunization: Secondary | ICD-10-CM | POA: Diagnosis not present

## 2021-02-28 DIAGNOSIS — Z992 Dependence on renal dialysis: Secondary | ICD-10-CM | POA: Diagnosis not present

## 2021-02-28 DIAGNOSIS — N186 End stage renal disease: Secondary | ICD-10-CM | POA: Diagnosis not present

## 2021-02-28 DIAGNOSIS — E559 Vitamin D deficiency, unspecified: Secondary | ICD-10-CM

## 2021-02-28 DIAGNOSIS — F17200 Nicotine dependence, unspecified, uncomplicated: Secondary | ICD-10-CM | POA: Diagnosis not present

## 2021-02-28 DIAGNOSIS — D571 Sickle-cell disease without crisis: Secondary | ICD-10-CM

## 2021-02-28 NOTE — Patient Instructions (Signed)
Sickle Cell Anemia, Adult Sickle cell anemia is a condition where your red blood cells are shaped like sickles. Red blood cells carry oxygen through the body. Sickle-shaped cells do not live as long as normal red blood cells. They also clump together and block blood from flowing through the blood vessels. This prevents the body from getting enough oxygen. Sickle cell anemia causes organ damage and pain. It also increases the risk of infection. Follow these instructions at home: Medicines Take over-the-counter and prescription medicines only as told by your doctor. If you were prescribed an antibiotic medicine, take it as told by your doctor. Do not stop taking the antibiotic even if you start to feel better. If you develop a fever, do not take medicines to lower the fever right away. Tell your doctor about the fever. Managing pain, stiffness, and swelling Try these methods to help with pain: Use a heating pad. Take a warm bath. Distract yourself, such as by watching TV. Eating and drinking Drink enough fluid to keep your pee (urine) clear or pale yellow. Drink more in hot weather and during exercise. Limit or avoid alcohol. Eat a healthy diet. Eat plenty of fruits, vegetables, whole grains, and lean protein. Take vitamins and supplements as told by your doctor. Traveling When traveling, keep these with you: Your medical information. The names of your doctors. Your medicines. If you need to take an airplane, talk to your doctor first. Activity Rest often. Avoid exercises that make your heart beat much faster, such as jogging. General instructions Do not use products that have nicotine or tobacco, such as cigarettes and e-cigarettes. If you need help quitting, ask your doctor. Consider wearing a medical alert bracelet. Avoid being in high places (high altitudes), such as mountains. Avoid very hot or cold temperatures. Avoid places where the temperature changes a lot. Keep all follow-up  visits as told by your doctor. This is important. Contact a doctor if: A joint hurts. Your feet or hands hurt or swell. You feel tired (fatigued). Get help right away if: You have symptoms of infection. These include: Fever. Chills. Being very tired. Irritability. Poor eating. Throwing up (vomiting). You feel dizzy or faint. You have new stomach pain, especially on the left side. You have a an erection (priapism) that lasts more than 4 hours. You have numbness in your arms or legs. You have a hard time moving your arms or legs. You have trouble talking. You have pain that does not go away when you take medicine. You are short of breath. You are breathing fast. You have a long-term cough. You have pain in your chest. You have a bad headache. You have a stiff neck. Your stomach looks bloated even though you did not eat much. Your skin is pale. You suddenly cannot see well. Summary Sickle cell anemia is a condition where your red blood cells are shaped like sickles. Follow your doctor's advice on ways to manage pain, food to eat, activities to do, and steps to take for safe travel. Get medical help right away if you have any signs of infection, such as a fever. This information is not intended to replace advice given to you by your health care provider. Make sure you discuss any questions you have with your health care provider. Document Revised: 10/29/2019 Document Reviewed: 10/29/2019 Elsevier Patient Education  [place].

## 2021-02-28 NOTE — Progress Notes (Signed)
Patient [facility]   Established Patient Office Visit  Subjective:  Patient ID: Patrick [name], male    DOB: [birth year]  Age: 65 y.o. MRN: [id number]  CC:  Chief Complaint  Patient presents with   Follow-up    Pt is here today for his follow up visit. Pt states he feels that he may be going into a mild crisis due to his legs hurting.     HPI Patrick [name] is a 65 year old male with a medical history significant for sickle cell disease, chronic pain syndrome, opiate dependence and tolerance, end-stage renal disease, congestive heart failure, and history of anemia of chronic disease presents for a 85-monthfollow-up of sickle cell disease.  Patient also endorses bilateral lower extremity pain.  Patient states that he completed his dialysis session this a.m. he states that pain intensity was worsening throughout session and the last time he had his oxycodone was last night.  He rates his pain as 6/10.  He characterizes his pain as intermittent and aching.  Patient denies any headache, dizziness, chest pain, urinary symptoms, nausea, vomiting, or diarrhea.  Past Medical History:  Diagnosis Date   Blood dyscrasia    Blood transfusion    "I've had a bunch of them"   CH[place] ([place]estive heart failure) [place]    [place] [facility]   [facility] kidney [facility] ([facility]), [facility] ([facility]) [facility]    [place] [place]   AV fistula (right arm)   Gout    Headache(784.0)    "probably weekly" (01/13/2014)   Legally blind    Shortness of breath dyspnea    Sickle cell disease, type SS (HCC)    Swelling abdomen    Swelling of extremity, left    Swelling of extremity, right     Past Surgical History:  Procedure Laterality Date   A/V FISTULAGRAM Right 07/29/2020   Procedure: A/V FISTULAGRAM - Right Arm;  Surgeon: DAngelia Mould MD;  [facility];  Service: Cardiovascular;  Laterality: Right;   BASCILIC VEIN  TRANSPOSITION Right 04/12/2014   Procedure: BASILIC VEIN TRANSPOSITION;  Surgeon: CElam Dutch MD;  [place] [facility]  Service: Vascular;  Laterality: Right;   BIOPSY  08/04/2020   Procedure: BIOPSY;  Surgeon: SLadene Artist MD;  [facility];  Service: Gastroenterology;;   CARDIAC CATHETERIZATION  05/2011   CARDIOVERSION N/A 06/05/2018   Procedure: CARDIOVERSION;  Surgeon: BJolaine Artist MD;  [place] [facility]  Service: Cardiovascular;  Laterality: N/A;   CARDIOVERSION N/A 10/06/2019   Procedure: CARDIOVERSION;  Surgeon: BJolaine Artist MD;  [place] [facility]  Service: Cardiovascular;  Laterality: N/A;   COLONOSCOPY WITH PROPOFOL N/A 09/24/2017   Procedure: COLONOSCOPY WITH PROPOFOL;  Surgeon: GJackquline Denmark MD;  [place] [facility]  Service: Endoscopy;  Laterality: N/A;   ESOPHAGOGASTRODUODENOSCOPY N/A 09/19/2017   Procedure: ESOPHAGOGASTRODUODENOSCOPY (EGD);  Surgeon: GJackquline Denmark MD;  [facility];  Service: Endoscopy;  Laterality: N/A;   ESOPHAGOGASTRODUODENOSCOPY (EGD) WITH PROPOFOL N/A 08/04/2020   Procedure: ESOPHAGOGASTRODUODENOSCOPY (EGD) WITH PROPOFOL;  Surgeon: SLadene Artist MD;  [place]  Service: Gastroenterology;  Laterality: N/A;   LEFT AND RIGHT HEART CATHETERIZATION WITH CORONARY ANGIOGRAM N/A 06/11/2011   Procedure: LEFT AND RIGHT HEART CATHETERIZATION WITH CORONARY ANGIOGRAM;  Surgeon: DLarey Dresser MD;  [facility];  Service: Cardiovascular;  Laterality: N/A;   PERIPHERAL VASCULAR BALLOON ANGIOPLASTY Right 07/29/2020   Procedure: PERIPHERAL VASCULAR BALLOON ANGIOPLASTY;  Surgeon: Angelia Mould, MD;  Location: [place] [facility];  Service: Cardiovascular;  Laterality: Right;  arm fistula   REVISON OF ARTERIOVENOUS FISTULA Right 08/08/2020   Procedure: ANEURYSM EXCISION OF RIGHT UPPER EXTREMITY ARTERIOVENOUS FISTULA;  Surgeon: Angelia Mould, MD;  Location: [employer];  Service: Vascular;   Laterality: Right;   TEE WITHOUT CARDIOVERSION N/A 12/27/2015   Procedure: TRANSESOPHAGEAL ECHOCARDIOGRAM (TEE);  Surgeon: Sueanne Margarita, MD;  [facility];  Service: Cardiovascular;  Laterality: N/A;    Family History  Problem Relation Age of Onset   Alcohol abuse Mother    Liver disease Mother    Cirrhosis Mother    Heart attack Mother     Social History   Socioeconomic History   Marital status: Divorced    Spouse name: Not on file   Number of children: Not on file   Years of education: Not on file   Highest education level: Not on file  Occupational History   Not on file  Tobacco Use   Smoking status: Former    Packs/day: 0.25    Years: 41.00    Pack years: 10.25    Types: Cigarettes    Quit date: 12/08/2015    Years since quitting: 5.2   Smokeless tobacco: Never  Vaping Use   Vaping Use: Never used  Substance and Sexual Activity   Alcohol use: Not Currently    Alcohol/week: 3.0 standard drinks    Types: 1 Glasses of wine, 2 Cans of beer per week    Comment: occasional   Drug use: No   Sexual activity: Not Currently  Other Topics Concern   Not on file  Social History Narrative   Not on file   Social Determinants of Health   Financial Resource Strain: Not on file  Food Insecurity: Not on file  Transportation Needs: Not on file  Physical Activity: Not on file  Stress: Not on file  Social Connections: Not on file  Intimate Partner Violence: Not on file    Outpatient Medications Prior to Visit  Medication Sig Dispense Refill   acetaminophen (TYLENOL) 500 MG tablet Take 1,000 mg by mouth every 6 (six) hours as needed for moderate pain or headache.     albuterol (VENTOLIN HFA) 108 (90 Base) MCG/ACT inhaler Inhale 2 puffs into the lungs every 6 (six) hours as needed for wheezing or shortness of breath. 8 g 6   allopurinol (ZYLOPRIM) 100 MG tablet Take 1 tablet (100 mg total) by mouth daily. 30 tablet 1   AURYXIA 1 GM 210 MG(Fe) tablet Take 420 mg by  mouth 3 (three) times daily.     Budeson-Glycopyrrol-Formoterol (BREZTRI AEROSPHERE) 160-9-4.8 MCG/ACT AERO Inhale 2 puffs into the lungs in the morning and at bedtime. 10.7 g 0   diphenhydramine-acetaminophen (TYLENOL PM) 25-500 MG TABS tablet Take 2 tablets by mouth at bedtime as needed (sleep).     ELIQUIS 5 MG TABS tablet TAKE 1 TABLET BY MOUTH TWICE A DAY 60 tablet 2   folic acid (FOLVITE) 1 MG tablet TAKE 1 TABLET BY MOUTH EVERY DAY 90 tablet 3   gabapentin (NEURONTIN) 100 MG capsule TAKE 1 CAPSULE BY MOUTH EVERYDAY AT BEDTIME 30 capsule 3   hydroxyurea (HYDREA) 500 MG capsule Take 1 capsule (500 mg total) by mouth daily. TAKE 1 CAPSULE BY MOUTH EVERY DAY WITH FOOD TO MINIMIZE GI SIDE EFFECTS 90 capsule 1   isosorbide mononitrate (IMDUR) 30 MG 24 hr tablet Take 1 tablet (30 mg total)  by mouth daily. DO NOT TAKE AM ON DIALYSIS DAYS. 30 tablet 1   lidocaine-prilocaine (EMLA) cream Apply 1 application topically daily as needed (port access).      metolazone (ZAROXOLYN) 5 MG tablet Take 1 tablet (5 mg total) by mouth daily as needed. (Patient taking differently: Take 5 mg by mouth daily as needed (fluid).) 10 tablet 0   Oxycodone HCl 10 MG TABS Take 1 tablet (10 mg total) by mouth every 4 (four) hours as needed (pain). 90 tablet 0   tiZANidine (ZANAFLEX) 4 MG tablet TAKE 0.5 TABLET BY MOUTH EVERY 8 HOURS AS NEEDED FOR MUSCLE SPASMS 30 tablet 1   torsemide (DEMADEX) 20 MG tablet Take 80 mg by mouth daily.     Vitamin D, Ergocalciferol, (DRISDOL) 1.25 MG (50000 UNIT) CAPS capsule TAKE 1 CAPSULE BY MOUTH ONE TIME PER WEEK 12 capsule 1   carvedilol (COREG) 12.5 MG tablet TAKE 1 TABLET BY MOUTH 2 (TWO) TIMES DAILY WITH A MEAL. (Patient not taking: Reported on 02/28/2021) 180 tablet 2   hydrALAZINE (APRESOLINE) 25 MG tablet Take 1 tablet (25 mg total) by mouth in the morning and at bedtime. DO NOT TAKE AM DOSES ON DIALYSIS DAYS (Patient taking differently: Take 25 mg by mouth See admin instructions. Take 1  tablet by mouth twice daily all days, EXCEPT on dialysis day (T, Th, Sat) take in the evening only) 60 tablet 3   Facility-Administered Medications Prior to Visit  Medication Dose Route Frequency Provider Last Rate Last Admin   0.9 %  sodium chloride infusion  250 mL Intravenous PRN Elam Dutch, MD       sodium chloride flush (NS) 0.9 % injection 3 mL  3 mL Intravenous Q12H Fields, Jessy Oto, MD       sodium chloride flush (NS) 0.9 % injection 3 mL  3 mL Intravenous PRN Elam Dutch, MD        No Known Allergies  ROS Review of Systems  Constitutional: Negative.   HENT: Negative.    Eyes: Negative.   Respiratory: Negative.    Cardiovascular: Negative.   Gastrointestinal: Negative.   Endocrine: Negative.   Genitourinary: Negative.   Musculoskeletal:  Positive for arthralgias and back pain.  Skin: Negative.   Neurological: Negative.   Psychiatric/Behavioral: Negative.       Objective:    Physical Exam Constitutional:      Appearance: Normal appearance.  HENT:     Mouth/Throat:     Mouth: Mucous membranes are moist.     Pharynx: Oropharynx is clear.  Eyes:     Pupils: Pupils are equal, round, and reactive to light.  Cardiovascular:     Rate and Rhythm: Normal rate and regular rhythm.     Pulses: Normal pulses.  Pulmonary:     Effort: Pulmonary effort is normal.  Abdominal:     General: Abdomen is flat. Bowel sounds are normal.  Musculoskeletal:        General: Normal range of motion.  Skin:    General: Skin is warm.  Neurological:     General: No focal deficit present.     Mental Status: He is alert. Mental status is at baseline.  Psychiatric:        Mood and Affect: Mood normal.        Behavior: Behavior normal.        Thought Content: Thought content normal.        Judgment: Judgment normal.    BP (!) 151/80  Pulse 96   Temp 98.1 F (36.7 C)   Ht '5\' 11"'$  (1.803 m)   Wt 154 lb (69.9 kg)   SpO2 100%   BMI 21.48 kg/m  Wt Readings from Last 3  Encounters:  02/28/21 154 lb (69.9 kg)  12/21/20 154 lb (69.9 kg)  08/23/20 158 lb (71.7 kg)     Health Maintenance Due  Topic Date Due   URINE MICROALBUMIN  Never done   Pneumococcal Vaccine 10-56 Years old (3 - PPSV23 or PCV20) 02/10/2018   COVID-19 Vaccine (3 - Booster for Pfizer series) 03/27/2020    There are no preventive care reminders to display for this patient.  Lab Results  Component Value Date   TSH 2.301 02/17/2015   Lab Results  Component Value Date   WBC 4.8 08/05/2020   HGB 9.5 (L) 08/08/2020   HCT 28.0 (L) 08/08/2020   MCV 106.6 (H) 08/05/2020   PLT 85 (L) 08/05/2020   Lab Results  Component Value Date   NA 138 08/08/2020   K 4.1 08/08/2020   CO2 27 08/05/2020   GLUCOSE 89 08/08/2020   BUN 43 (H) 08/08/2020   CREATININE 6.40 (H) 08/08/2020   BILITOT 2.3 (H) 08/04/2020   ALKPHOS 80 08/04/2020   AST 26 08/04/2020   ALT 15 08/04/2020   PROT 6.1 (L) 08/04/2020   ALBUMIN 2.8 (L) 08/04/2020   CALCIUM 7.6 (L) 08/05/2020   ANIONGAP 10 08/05/2020   GFR 46.63 (L) 01/08/2012   Lab Results  Component Value Date   CHOL 114 (L) 02/17/2015   Lab Results  Component Value Date   HDL 31 (L) 02/17/2015   Lab Results  Component Value Date   LDLCALC 68 02/17/2015   Lab Results  Component Value Date   TRIG 76 02/17/2015   Lab Results  Component Value Date   CHOLHDL 3.7 02/17/2015   Lab Results  Component Value Date   HGBA1C <4.0 06/07/2011      Assessment & Plan:   Problem List Items Addressed This Visit       Other   Hb-SS disease without crisis ([place]) - Primary   Relevant Orders   POCT URINALYSIS DIP (CLINITEK)    Hb-SS disease without crisis ([place])  - Sickle Cell Panel; Future   Need for pneumococcal vaccine  - Pneumococcal polysaccharide vaccine 23-valent greater than or equal to 2yo subcutaneous/IM  End stage renal disease ([place]) Continue dialysis per nephrology  Vitamin D deficiency  - Sickle Cell Panel; Future  Tobacco  dependence Smoking cessation instruction/counseling given:  counseled patient on the dangers of tobacco use, advised patient to stop smoking, and reviewed strategies to maximize success    Follow-up: Return in about 3 months (around 05/30/2021) for sickle cell anemia.    [name]  APRN, MSN, FNP-C [facility] [address] [place], [place] [address] [phone]

## 2021-03-07 DIAGNOSIS — D57219 Sickle-cell/Hb-C disease with crisis, unspecified: Secondary | ICD-10-CM | POA: Diagnosis not present

## 2021-03-07 DIAGNOSIS — Z992 Dependence on renal dialysis: Secondary | ICD-10-CM | POA: Diagnosis not present

## 2021-03-07 DIAGNOSIS — D631 Anemia in chronic kidney disease: Secondary | ICD-10-CM | POA: Diagnosis not present

## 2021-03-07 DIAGNOSIS — D509 Iron deficiency anemia, unspecified: Secondary | ICD-10-CM | POA: Diagnosis not present

## 2021-03-07 DIAGNOSIS — N2581 Secondary hyperparathyroidism of renal origin: Secondary | ICD-10-CM | POA: Diagnosis not present

## 2021-03-07 DIAGNOSIS — D57819 Other sickle-cell disorders with crisis, unspecified: Secondary | ICD-10-CM | POA: Diagnosis not present

## 2021-03-07 DIAGNOSIS — N186 End stage renal disease: Secondary | ICD-10-CM | POA: Diagnosis not present

## 2021-03-07 DIAGNOSIS — D571 Sickle-cell disease without crisis: Secondary | ICD-10-CM | POA: Diagnosis not present

## 2021-03-14 DIAGNOSIS — E876 Hypokalemia: Secondary | ICD-10-CM | POA: Diagnosis not present

## 2021-03-14 DIAGNOSIS — D509 Iron deficiency anemia, unspecified: Secondary | ICD-10-CM | POA: Diagnosis not present

## 2021-03-14 DIAGNOSIS — D57819 Other sickle-cell disorders with crisis, unspecified: Secondary | ICD-10-CM | POA: Diagnosis not present

## 2021-03-14 DIAGNOSIS — Z992 Dependence on renal dialysis: Secondary | ICD-10-CM | POA: Diagnosis not present

## 2021-03-14 DIAGNOSIS — D571 Sickle-cell disease without crisis: Secondary | ICD-10-CM | POA: Diagnosis not present

## 2021-03-14 DIAGNOSIS — N2581 Secondary hyperparathyroidism of renal origin: Secondary | ICD-10-CM | POA: Diagnosis not present

## 2021-03-14 DIAGNOSIS — Z23 Encounter for immunization: Secondary | ICD-10-CM | POA: Diagnosis not present

## 2021-03-14 DIAGNOSIS — D57219 Sickle-cell/Hb-C disease with crisis, unspecified: Secondary | ICD-10-CM | POA: Diagnosis not present

## 2021-03-14 DIAGNOSIS — D631 Anemia in chronic kidney disease: Secondary | ICD-10-CM | POA: Diagnosis not present

## 2021-03-14 DIAGNOSIS — N186 End stage renal disease: Secondary | ICD-10-CM | POA: Diagnosis not present

## 2021-03-17 ENCOUNTER — Other Ambulatory Visit: Payer: Self-pay | Admitting: Family Medicine

## 2021-03-17 DIAGNOSIS — Z992 Dependence on renal dialysis: Secondary | ICD-10-CM | POA: Diagnosis not present

## 2021-03-17 DIAGNOSIS — N186 End stage renal disease: Secondary | ICD-10-CM | POA: Diagnosis not present

## 2021-03-17 DIAGNOSIS — I158 Other secondary hypertension: Secondary | ICD-10-CM | POA: Diagnosis not present

## 2021-03-18 DIAGNOSIS — D571 Sickle-cell disease without crisis: Secondary | ICD-10-CM | POA: Diagnosis not present

## 2021-03-18 DIAGNOSIS — Z992 Dependence on renal dialysis: Secondary | ICD-10-CM | POA: Diagnosis not present

## 2021-03-18 DIAGNOSIS — D57219 Sickle-cell/Hb-C disease with crisis, unspecified: Secondary | ICD-10-CM | POA: Diagnosis not present

## 2021-03-18 DIAGNOSIS — D57819 Other sickle-cell disorders with crisis, unspecified: Secondary | ICD-10-CM | POA: Diagnosis not present

## 2021-03-18 DIAGNOSIS — N186 End stage renal disease: Secondary | ICD-10-CM | POA: Diagnosis not present

## 2021-03-18 DIAGNOSIS — N2581 Secondary hyperparathyroidism of renal origin: Secondary | ICD-10-CM | POA: Diagnosis not present

## 2021-03-21 DIAGNOSIS — D57819 Other sickle-cell disorders with crisis, unspecified: Secondary | ICD-10-CM | POA: Diagnosis not present

## 2021-03-21 DIAGNOSIS — D631 Anemia in chronic kidney disease: Secondary | ICD-10-CM | POA: Diagnosis not present

## 2021-03-21 DIAGNOSIS — E876 Hypokalemia: Secondary | ICD-10-CM | POA: Diagnosis not present

## 2021-03-21 DIAGNOSIS — D571 Sickle-cell disease without crisis: Secondary | ICD-10-CM | POA: Diagnosis not present

## 2021-03-21 DIAGNOSIS — D57219 Sickle-cell/Hb-C disease with crisis, unspecified: Secondary | ICD-10-CM | POA: Diagnosis not present

## 2021-03-21 DIAGNOSIS — N2581 Secondary hyperparathyroidism of renal origin: Secondary | ICD-10-CM | POA: Diagnosis not present

## 2021-03-21 DIAGNOSIS — D509 Iron deficiency anemia, unspecified: Secondary | ICD-10-CM | POA: Diagnosis not present

## 2021-03-21 DIAGNOSIS — N186 End stage renal disease: Secondary | ICD-10-CM | POA: Diagnosis not present

## 2021-03-21 DIAGNOSIS — Z992 Dependence on renal dialysis: Secondary | ICD-10-CM | POA: Diagnosis not present

## 2021-03-28 DIAGNOSIS — D57219 Sickle-cell/Hb-C disease with crisis, unspecified: Secondary | ICD-10-CM | POA: Diagnosis not present

## 2021-03-28 DIAGNOSIS — E876 Hypokalemia: Secondary | ICD-10-CM | POA: Diagnosis not present

## 2021-03-28 DIAGNOSIS — D57819 Other sickle-cell disorders with crisis, unspecified: Secondary | ICD-10-CM | POA: Diagnosis not present

## 2021-03-28 DIAGNOSIS — N2581 Secondary hyperparathyroidism of renal origin: Secondary | ICD-10-CM | POA: Diagnosis not present

## 2021-03-28 DIAGNOSIS — D631 Anemia in chronic kidney disease: Secondary | ICD-10-CM | POA: Diagnosis not present

## 2021-03-28 DIAGNOSIS — Z992 Dependence on renal dialysis: Secondary | ICD-10-CM | POA: Diagnosis not present

## 2021-03-28 DIAGNOSIS — N186 End stage renal disease: Secondary | ICD-10-CM | POA: Diagnosis not present

## 2021-03-28 DIAGNOSIS — D571 Sickle-cell disease without crisis: Secondary | ICD-10-CM | POA: Diagnosis not present

## 2021-03-28 DIAGNOSIS — D509 Iron deficiency anemia, unspecified: Secondary | ICD-10-CM | POA: Diagnosis not present

## 2021-04-04 DIAGNOSIS — D57219 Sickle-cell/Hb-C disease with crisis, unspecified: Secondary | ICD-10-CM | POA: Diagnosis not present

## 2021-04-04 DIAGNOSIS — N2581 Secondary hyperparathyroidism of renal origin: Secondary | ICD-10-CM | POA: Diagnosis not present

## 2021-04-04 DIAGNOSIS — D631 Anemia in chronic kidney disease: Secondary | ICD-10-CM | POA: Diagnosis not present

## 2021-04-04 DIAGNOSIS — D57819 Other sickle-cell disorders with crisis, unspecified: Secondary | ICD-10-CM | POA: Diagnosis not present

## 2021-04-04 DIAGNOSIS — N186 End stage renal disease: Secondary | ICD-10-CM | POA: Diagnosis not present

## 2021-04-04 DIAGNOSIS — Z992 Dependence on renal dialysis: Secondary | ICD-10-CM | POA: Diagnosis not present

## 2021-04-04 DIAGNOSIS — D509 Iron deficiency anemia, unspecified: Secondary | ICD-10-CM | POA: Diagnosis not present

## 2021-04-04 DIAGNOSIS — D571 Sickle-cell disease without crisis: Secondary | ICD-10-CM | POA: Diagnosis not present

## 2021-04-11 DIAGNOSIS — D57819 Other sickle-cell disorders with crisis, unspecified: Secondary | ICD-10-CM | POA: Diagnosis not present

## 2021-04-11 DIAGNOSIS — Z992 Dependence on renal dialysis: Secondary | ICD-10-CM | POA: Diagnosis not present

## 2021-04-11 DIAGNOSIS — D509 Iron deficiency anemia, unspecified: Secondary | ICD-10-CM | POA: Diagnosis not present

## 2021-04-11 DIAGNOSIS — D571 Sickle-cell disease without crisis: Secondary | ICD-10-CM | POA: Diagnosis not present

## 2021-04-11 DIAGNOSIS — N2581 Secondary hyperparathyroidism of renal origin: Secondary | ICD-10-CM | POA: Diagnosis not present

## 2021-04-11 DIAGNOSIS — N186 End stage renal disease: Secondary | ICD-10-CM | POA: Diagnosis not present

## 2021-04-11 DIAGNOSIS — D631 Anemia in chronic kidney disease: Secondary | ICD-10-CM | POA: Diagnosis not present

## 2021-04-11 DIAGNOSIS — D57219 Sickle-cell/Hb-C disease with crisis, unspecified: Secondary | ICD-10-CM | POA: Diagnosis not present

## 2021-04-14 ENCOUNTER — Telehealth: Payer: Self-pay

## 2021-04-14 ENCOUNTER — Other Ambulatory Visit: Payer: Self-pay | Admitting: Family Medicine

## 2021-04-14 DIAGNOSIS — G894 Chronic pain syndrome: Secondary | ICD-10-CM

## 2021-04-14 MED ORDER — OXYCODONE HCL 10 MG PO TABS: 10.0000 mg | ORAL_TABLET | ORAL | 0 refills | Status: DC | PRN

## 2021-04-14 NOTE — Telephone Encounter (Signed)
Pt called in asking for his Pain Med and his back spam Med

## 2021-04-14 NOTE — Progress Notes (Signed)
Reviewed PDMP substance reporting system prior to prescribing opiate medications. No inconsistencies noted.   Meds ordered this encounter  Medications   Oxycodone HCl 10 MG TABS    Sig: Take 1 tablet (10 mg total) by mouth every 4 (four) hours as needed (pain).    Dispense:  90 tablet    Refill:  0    Order Specific Question:   Supervising Provider    Answer:   [name] [[id number]]    [name]  APRN, MSN, FNP-C [facility] [address] [place], [place] [address] [phone]

## 2021-04-15 ENCOUNTER — Other Ambulatory Visit: Payer: Self-pay | Admitting: Family Medicine

## 2021-04-17 DIAGNOSIS — N186 End stage renal disease: Secondary | ICD-10-CM | POA: Diagnosis not present

## 2021-04-17 DIAGNOSIS — I158 Other secondary hypertension: Secondary | ICD-10-CM | POA: Diagnosis not present

## 2021-04-17 DIAGNOSIS — Z992 Dependence on renal dialysis: Secondary | ICD-10-CM | POA: Diagnosis not present

## 2021-04-18 DIAGNOSIS — D571 Sickle-cell disease without crisis: Secondary | ICD-10-CM | POA: Diagnosis not present

## 2021-04-18 DIAGNOSIS — N186 End stage renal disease: Secondary | ICD-10-CM | POA: Diagnosis not present

## 2021-04-18 DIAGNOSIS — D57819 Other sickle-cell disorders with crisis, unspecified: Secondary | ICD-10-CM | POA: Diagnosis not present

## 2021-04-18 DIAGNOSIS — D631 Anemia in chronic kidney disease: Secondary | ICD-10-CM | POA: Diagnosis not present

## 2021-04-18 DIAGNOSIS — Z992 Dependence on renal dialysis: Secondary | ICD-10-CM | POA: Diagnosis not present

## 2021-04-18 DIAGNOSIS — N2581 Secondary hyperparathyroidism of renal origin: Secondary | ICD-10-CM | POA: Diagnosis not present

## 2021-04-18 DIAGNOSIS — D509 Iron deficiency anemia, unspecified: Secondary | ICD-10-CM | POA: Diagnosis not present

## 2021-04-18 DIAGNOSIS — D57219 Sickle-cell/Hb-C disease with crisis, unspecified: Secondary | ICD-10-CM | POA: Diagnosis not present

## 2021-04-25 DIAGNOSIS — N186 End stage renal disease: Secondary | ICD-10-CM | POA: Diagnosis not present

## 2021-04-25 DIAGNOSIS — D57219 Sickle-cell/Hb-C disease with crisis, unspecified: Secondary | ICD-10-CM | POA: Diagnosis not present

## 2021-04-25 DIAGNOSIS — D57819 Other sickle-cell disorders with crisis, unspecified: Secondary | ICD-10-CM | POA: Diagnosis not present

## 2021-04-25 DIAGNOSIS — D631 Anemia in chronic kidney disease: Secondary | ICD-10-CM | POA: Diagnosis not present

## 2021-04-25 DIAGNOSIS — Z992 Dependence on renal dialysis: Secondary | ICD-10-CM | POA: Diagnosis not present

## 2021-04-25 DIAGNOSIS — D571 Sickle-cell disease without crisis: Secondary | ICD-10-CM | POA: Diagnosis not present

## 2021-04-25 DIAGNOSIS — D509 Iron deficiency anemia, unspecified: Secondary | ICD-10-CM | POA: Diagnosis not present

## 2021-04-25 DIAGNOSIS — N2581 Secondary hyperparathyroidism of renal origin: Secondary | ICD-10-CM | POA: Diagnosis not present

## 2021-05-02 DIAGNOSIS — D57819 Other sickle-cell disorders with crisis, unspecified: Secondary | ICD-10-CM | POA: Diagnosis not present

## 2021-05-02 DIAGNOSIS — D57219 Sickle-cell/Hb-C disease with crisis, unspecified: Secondary | ICD-10-CM | POA: Diagnosis not present

## 2021-05-02 DIAGNOSIS — D509 Iron deficiency anemia, unspecified: Secondary | ICD-10-CM | POA: Diagnosis not present

## 2021-05-02 DIAGNOSIS — N2581 Secondary hyperparathyroidism of renal origin: Secondary | ICD-10-CM | POA: Diagnosis not present

## 2021-05-02 DIAGNOSIS — D631 Anemia in chronic kidney disease: Secondary | ICD-10-CM | POA: Diagnosis not present

## 2021-05-02 DIAGNOSIS — D571 Sickle-cell disease without crisis: Secondary | ICD-10-CM | POA: Diagnosis not present

## 2021-05-02 DIAGNOSIS — Z992 Dependence on renal dialysis: Secondary | ICD-10-CM | POA: Diagnosis not present

## 2021-05-02 DIAGNOSIS — N186 End stage renal disease: Secondary | ICD-10-CM | POA: Diagnosis not present

## 2021-05-09 DIAGNOSIS — K8050 Calculus of bile duct without cholangitis or cholecystitis without obstruction: Secondary | ICD-10-CM | POA: Diagnosis not present

## 2021-05-09 DIAGNOSIS — K921 Melena: Secondary | ICD-10-CM | POA: Diagnosis not present

## 2021-05-09 DIAGNOSIS — R7401 Elevation of levels of liver transaminase levels: Secondary | ICD-10-CM | POA: Diagnosis not present

## 2021-05-09 DIAGNOSIS — I4821 Permanent atrial fibrillation: Secondary | ICD-10-CM | POA: Diagnosis not present

## 2021-05-09 DIAGNOSIS — D571 Sickle-cell disease without crisis: Secondary | ICD-10-CM | POA: Diagnosis not present

## 2021-05-09 DIAGNOSIS — Z7901 Long term (current) use of anticoagulants: Secondary | ICD-10-CM | POA: Diagnosis not present

## 2021-05-09 DIAGNOSIS — R0789 Other chest pain: Secondary | ICD-10-CM | POA: Diagnosis not present

## 2021-05-09 DIAGNOSIS — I4891 Unspecified atrial fibrillation: Secondary | ICD-10-CM | POA: Diagnosis not present

## 2021-05-09 DIAGNOSIS — K56609 Unspecified intestinal obstruction, unspecified as to partial versus complete obstruction: Secondary | ICD-10-CM | POA: Diagnosis not present

## 2021-05-09 DIAGNOSIS — I5042 Chronic combined systolic (congestive) and diastolic (congestive) heart failure: Secondary | ICD-10-CM | POA: Diagnosis not present

## 2021-05-09 DIAGNOSIS — D649 Anemia, unspecified: Secondary | ICD-10-CM | POA: Diagnosis not present

## 2021-05-09 DIAGNOSIS — D5700 Hb-SS disease with crisis, unspecified: Secondary | ICD-10-CM | POA: Diagnosis not present

## 2021-05-09 DIAGNOSIS — K5669 Other partial intestinal obstruction: Secondary | ICD-10-CM | POA: Diagnosis not present

## 2021-05-09 DIAGNOSIS — Z87891 Personal history of nicotine dependence: Secondary | ICD-10-CM | POA: Diagnosis not present

## 2021-05-09 DIAGNOSIS — H547 Unspecified visual loss: Secondary | ICD-10-CM | POA: Diagnosis not present

## 2021-05-09 DIAGNOSIS — Z992 Dependence on renal dialysis: Secondary | ICD-10-CM | POA: Diagnosis not present

## 2021-05-09 DIAGNOSIS — K838 Other specified diseases of biliary tract: Secondary | ICD-10-CM | POA: Diagnosis not present

## 2021-05-09 DIAGNOSIS — N186 End stage renal disease: Secondary | ICD-10-CM | POA: Diagnosis not present

## 2021-05-09 DIAGNOSIS — R9431 Abnormal electrocardiogram [ECG] [EKG]: Secondary | ICD-10-CM | POA: Diagnosis not present

## 2021-05-09 DIAGNOSIS — K7460 Unspecified cirrhosis of liver: Secondary | ICD-10-CM | POA: Diagnosis not present

## 2021-05-09 DIAGNOSIS — Z79899 Other long term (current) drug therapy: Secondary | ICD-10-CM | POA: Diagnosis not present

## 2021-05-09 DIAGNOSIS — K920 Hematemesis: Secondary | ICD-10-CM | POA: Diagnosis not present

## 2021-05-09 DIAGNOSIS — Z20822 Contact with and (suspected) exposure to covid-19: Secondary | ICD-10-CM | POA: Diagnosis not present

## 2021-05-09 DIAGNOSIS — R188 Other ascites: Secondary | ICD-10-CM | POA: Diagnosis not present

## 2021-05-09 DIAGNOSIS — D631 Anemia in chronic kidney disease: Secondary | ICD-10-CM | POA: Diagnosis not present

## 2021-05-09 DIAGNOSIS — Z4682 Encounter for fitting and adjustment of non-vascular catheter: Secondary | ICD-10-CM | POA: Diagnosis not present

## 2021-05-09 DIAGNOSIS — K55059 Acute (reversible) ischemia of intestine, part and extent unspecified: Secondary | ICD-10-CM | POA: Diagnosis not present

## 2021-05-09 DIAGNOSIS — K8071 Calculus of gallbladder and bile duct without cholecystitis with obstruction: Secondary | ICD-10-CM | POA: Diagnosis not present

## 2021-05-09 DIAGNOSIS — K922 Gastrointestinal hemorrhage, unspecified: Secondary | ICD-10-CM | POA: Diagnosis not present

## 2021-05-13 ENCOUNTER — Other Ambulatory Visit: Payer: Self-pay | Admitting: Family Medicine

## 2021-05-13 DIAGNOSIS — R7401 Elevation of levels of liver transaminase levels: Secondary | ICD-10-CM | POA: Diagnosis not present

## 2021-05-13 DIAGNOSIS — K8050 Calculus of bile duct without cholangitis or cholecystitis without obstruction: Secondary | ICD-10-CM | POA: Diagnosis not present

## 2021-05-13 DIAGNOSIS — I4821 Permanent atrial fibrillation: Secondary | ICD-10-CM | POA: Diagnosis not present

## 2021-05-13 DIAGNOSIS — K56609 Unspecified intestinal obstruction, unspecified as to partial versus complete obstruction: Secondary | ICD-10-CM | POA: Diagnosis not present

## 2021-05-13 DIAGNOSIS — E559 Vitamin D deficiency, unspecified: Secondary | ICD-10-CM

## 2021-05-16 ENCOUNTER — Other Ambulatory Visit (HOSPITAL_COMMUNITY): Payer: Self-pay | Admitting: *Deleted

## 2021-05-16 ENCOUNTER — Telehealth: Payer: Self-pay

## 2021-05-16 ENCOUNTER — Other Ambulatory Visit: Payer: Self-pay | Admitting: Family Medicine

## 2021-05-16 DIAGNOSIS — Z992 Dependence on renal dialysis: Secondary | ICD-10-CM | POA: Diagnosis not present

## 2021-05-16 DIAGNOSIS — D631 Anemia in chronic kidney disease: Secondary | ICD-10-CM | POA: Diagnosis not present

## 2021-05-16 DIAGNOSIS — N186 End stage renal disease: Secondary | ICD-10-CM | POA: Diagnosis not present

## 2021-05-16 DIAGNOSIS — D571 Sickle-cell disease without crisis: Secondary | ICD-10-CM | POA: Diagnosis not present

## 2021-05-16 DIAGNOSIS — N2581 Secondary hyperparathyroidism of renal origin: Secondary | ICD-10-CM | POA: Diagnosis not present

## 2021-05-16 DIAGNOSIS — D57219 Sickle-cell/Hb-C disease with crisis, unspecified: Secondary | ICD-10-CM | POA: Diagnosis not present

## 2021-05-16 DIAGNOSIS — D57819 Other sickle-cell disorders with crisis, unspecified: Secondary | ICD-10-CM | POA: Diagnosis not present

## 2021-05-16 DIAGNOSIS — D509 Iron deficiency anemia, unspecified: Secondary | ICD-10-CM | POA: Diagnosis not present

## 2021-05-16 MED ORDER — CARVEDILOL 12.5 MG PO TABS: 12.5000 mg | ORAL_TABLET | Freq: Two times a day (BID) | ORAL | 2 refills | Status: DC

## 2021-05-16 NOTE — Telephone Encounter (Signed)
Pain meds

## 2021-05-17 ENCOUNTER — Other Ambulatory Visit: Payer: Self-pay | Admitting: Family Medicine

## 2021-05-17 DIAGNOSIS — Z992 Dependence on renal dialysis: Secondary | ICD-10-CM | POA: Diagnosis not present

## 2021-05-17 DIAGNOSIS — N186 End stage renal disease: Secondary | ICD-10-CM | POA: Diagnosis not present

## 2021-05-17 DIAGNOSIS — G894 Chronic pain syndrome: Secondary | ICD-10-CM

## 2021-05-17 DIAGNOSIS — I158 Other secondary hypertension: Secondary | ICD-10-CM | POA: Diagnosis not present

## 2021-05-17 MED ORDER — OXYCODONE HCL 10 MG PO TABS: 10.0000 mg | ORAL_TABLET | ORAL | 0 refills | Status: DC | PRN

## 2021-05-17 NOTE — Progress Notes (Signed)
Reviewed PDMP substance reporting system prior to prescribing opiate medications. No inconsistencies noted.  Meds ordered this encounter  Medications   Oxycodone HCl 10 MG TABS    Sig: Take 1 tablet (10 mg total) by mouth every 4 (four) hours as needed (pain).    Dispense:  90 tablet    Refill:  0    Order Specific Question:   Supervising Provider    Answer:   [name] [[id number]]   [name]  APRN, MSN, FNP-C [name] [address] [place], [place] [address] [phone]

## 2021-05-18 DIAGNOSIS — D571 Sickle-cell disease without crisis: Secondary | ICD-10-CM | POA: Diagnosis not present

## 2021-05-18 DIAGNOSIS — D631 Anemia in chronic kidney disease: Secondary | ICD-10-CM | POA: Diagnosis not present

## 2021-05-18 DIAGNOSIS — N2581 Secondary hyperparathyroidism of renal origin: Secondary | ICD-10-CM | POA: Diagnosis not present

## 2021-05-18 DIAGNOSIS — D57219 Sickle-cell/Hb-C disease with crisis, unspecified: Secondary | ICD-10-CM | POA: Diagnosis not present

## 2021-05-18 DIAGNOSIS — Z992 Dependence on renal dialysis: Secondary | ICD-10-CM | POA: Diagnosis not present

## 2021-05-18 DIAGNOSIS — D57819 Other sickle-cell disorders with crisis, unspecified: Secondary | ICD-10-CM | POA: Diagnosis not present

## 2021-05-18 DIAGNOSIS — N186 End stage renal disease: Secondary | ICD-10-CM | POA: Diagnosis not present

## 2021-05-19 ENCOUNTER — Inpatient Hospital Stay (HOSPITAL_COMMUNITY): Payer: HMO

## 2021-05-19 ENCOUNTER — Emergency Department (HOSPITAL_COMMUNITY): Payer: HMO

## 2021-05-19 ENCOUNTER — Encounter (HOSPITAL_COMMUNITY): Payer: Self-pay | Admitting: Emergency Medicine

## 2021-05-19 ENCOUNTER — Inpatient Hospital Stay (HOSPITAL_COMMUNITY)
Admission: EM | Admit: 2021-05-19 | Discharge: 2021-06-10 | DRG: 329 | Disposition: A | Discharge status: Home Health | Payer: HMO | Attending: Internal Medicine | Admitting: Internal Medicine

## 2021-05-19 ENCOUNTER — Ambulatory Visit: Payer: HMO | Admitting: Nurse Practitioner

## 2021-05-19 ENCOUNTER — Other Ambulatory Visit: Payer: Self-pay

## 2021-05-19 DIAGNOSIS — D649 Anemia, unspecified: Secondary | ICD-10-CM | POA: Diagnosis present

## 2021-05-19 DIAGNOSIS — I4821 Permanent atrial fibrillation: Secondary | ICD-10-CM | POA: Diagnosis not present

## 2021-05-19 DIAGNOSIS — Z4901 Encounter for fitting and adjustment of extracorporeal dialysis catheter: Secondary | ICD-10-CM | POA: Diagnosis not present

## 2021-05-19 DIAGNOSIS — I4891 Unspecified atrial fibrillation: Secondary | ICD-10-CM | POA: Diagnosis not present

## 2021-05-19 DIAGNOSIS — H3530 Unspecified macular degeneration: Secondary | ICD-10-CM | POA: Diagnosis present

## 2021-05-19 DIAGNOSIS — Z9289 Personal history of other medical treatment: Secondary | ICD-10-CM

## 2021-05-19 DIAGNOSIS — E43 Unspecified severe protein-calorie malnutrition: Secondary | ICD-10-CM | POA: Diagnosis present

## 2021-05-19 DIAGNOSIS — R531 Weakness: Secondary | ICD-10-CM | POA: Diagnosis not present

## 2021-05-19 DIAGNOSIS — I081 Rheumatic disorders of both mitral and tricuspid valves: Secondary | ICD-10-CM | POA: Diagnosis present

## 2021-05-19 DIAGNOSIS — E871 Hypo-osmolality and hyponatremia: Secondary | ICD-10-CM | POA: Diagnosis not present

## 2021-05-19 DIAGNOSIS — J90 Pleural effusion, not elsewhere classified: Secondary | ICD-10-CM | POA: Diagnosis not present

## 2021-05-19 DIAGNOSIS — R042 Hemoptysis: Secondary | ICD-10-CM

## 2021-05-19 DIAGNOSIS — N2581 Secondary hyperparathyroidism of renal origin: Secondary | ICD-10-CM | POA: Diagnosis not present

## 2021-05-19 DIAGNOSIS — Z992 Dependence on renal dialysis: Secondary | ICD-10-CM

## 2021-05-19 DIAGNOSIS — K810 Acute cholecystitis: Secondary | ICD-10-CM | POA: Diagnosis not present

## 2021-05-19 DIAGNOSIS — K5900 Constipation, unspecified: Secondary | ICD-10-CM | POA: Diagnosis not present

## 2021-05-19 DIAGNOSIS — R0902 Hypoxemia: Secondary | ICD-10-CM | POA: Diagnosis not present

## 2021-05-19 DIAGNOSIS — I132 Hypertensive heart and chronic kidney disease with heart failure and with stage 5 chronic kidney disease, or end stage renal disease: Secondary | ICD-10-CM | POA: Diagnosis not present

## 2021-05-19 DIAGNOSIS — K8066 Calculus of gallbladder and bile duct with acute and chronic cholecystitis without obstruction: Secondary | ICD-10-CM | POA: Diagnosis not present

## 2021-05-19 DIAGNOSIS — I4819 Other persistent atrial fibrillation: Secondary | ICD-10-CM | POA: Diagnosis not present

## 2021-05-19 DIAGNOSIS — I700 Atherosclerosis of aorta: Secondary | ICD-10-CM | POA: Diagnosis not present

## 2021-05-19 DIAGNOSIS — I428 Other cardiomyopathies: Secondary | ICD-10-CM | POA: Diagnosis not present

## 2021-05-19 DIAGNOSIS — Z20822 Contact with and (suspected) exposure to covid-19: Secondary | ICD-10-CM | POA: Diagnosis present

## 2021-05-19 DIAGNOSIS — R109 Unspecified abdominal pain: Secondary | ICD-10-CM

## 2021-05-19 DIAGNOSIS — K8050 Calculus of bile duct without cholangitis or cholecystitis without obstruction: Secondary | ICD-10-CM | POA: Diagnosis not present

## 2021-05-19 DIAGNOSIS — K31819 Angiodysplasia of stomach and duodenum without bleeding: Secondary | ICD-10-CM | POA: Diagnosis not present

## 2021-05-19 DIAGNOSIS — H548 Legal blindness, as defined in USA: Secondary | ICD-10-CM | POA: Diagnosis present

## 2021-05-19 DIAGNOSIS — D62 Acute posthemorrhagic anemia: Secondary | ICD-10-CM | POA: Diagnosis not present

## 2021-05-19 DIAGNOSIS — Z7901 Long term (current) use of anticoagulants: Secondary | ICD-10-CM

## 2021-05-19 DIAGNOSIS — Z4659 Encounter for fitting and adjustment of other gastrointestinal appliance and device: Secondary | ICD-10-CM | POA: Diagnosis not present

## 2021-05-19 DIAGNOSIS — D696 Thrombocytopenia, unspecified: Secondary | ICD-10-CM | POA: Diagnosis present

## 2021-05-19 DIAGNOSIS — D571 Sickle-cell disease without crisis: Secondary | ICD-10-CM | POA: Diagnosis not present

## 2021-05-19 DIAGNOSIS — K8000 Calculus of gallbladder with acute cholecystitis without obstruction: Secondary | ICD-10-CM | POA: Diagnosis not present

## 2021-05-19 DIAGNOSIS — Z87891 Personal history of nicotine dependence: Secondary | ICD-10-CM

## 2021-05-19 DIAGNOSIS — J9602 Acute respiratory failure with hypercapnia: Secondary | ICD-10-CM | POA: Diagnosis not present

## 2021-05-19 DIAGNOSIS — D735 Infarction of spleen: Secondary | ICD-10-CM | POA: Diagnosis not present

## 2021-05-19 DIAGNOSIS — E875 Hyperkalemia: Secondary | ICD-10-CM | POA: Diagnosis not present

## 2021-05-19 DIAGNOSIS — I272 Pulmonary hypertension, unspecified: Secondary | ICD-10-CM | POA: Diagnosis present

## 2021-05-19 DIAGNOSIS — D7389 Other diseases of spleen: Secondary | ICD-10-CM | POA: Diagnosis not present

## 2021-05-19 DIAGNOSIS — K31811 Angiodysplasia of stomach and duodenum with bleeding: Secondary | ICD-10-CM | POA: Diagnosis not present

## 2021-05-19 DIAGNOSIS — R0602 Shortness of breath: Secondary | ICD-10-CM | POA: Diagnosis not present

## 2021-05-19 DIAGNOSIS — K2971 Gastritis, unspecified, with bleeding: Secondary | ICD-10-CM | POA: Diagnosis present

## 2021-05-19 DIAGNOSIS — R1084 Generalized abdominal pain: Secondary | ICD-10-CM | POA: Diagnosis not present

## 2021-05-19 DIAGNOSIS — K55019 Acute (reversible) ischemia of small intestine, extent unspecified: Secondary | ICD-10-CM | POA: Diagnosis not present

## 2021-05-19 DIAGNOSIS — K559 Vascular disorder of intestine, unspecified: Secondary | ICD-10-CM | POA: Diagnosis not present

## 2021-05-19 DIAGNOSIS — K56601 Complete intestinal obstruction, unspecified as to cause: Secondary | ICD-10-CM | POA: Diagnosis not present

## 2021-05-19 DIAGNOSIS — Z01810 Encounter for preprocedural cardiovascular examination: Secondary | ICD-10-CM | POA: Diagnosis not present

## 2021-05-19 DIAGNOSIS — K5521 Angiodysplasia of colon with hemorrhage: Secondary | ICD-10-CM | POA: Diagnosis not present

## 2021-05-19 DIAGNOSIS — K56609 Unspecified intestinal obstruction, unspecified as to partial versus complete obstruction: Secondary | ICD-10-CM | POA: Diagnosis present

## 2021-05-19 DIAGNOSIS — G8918 Other acute postprocedural pain: Secondary | ICD-10-CM | POA: Diagnosis not present

## 2021-05-19 DIAGNOSIS — Z0189 Encounter for other specified special examinations: Secondary | ICD-10-CM

## 2021-05-19 DIAGNOSIS — K9189 Other postprocedural complications and disorders of digestive system: Secondary | ICD-10-CM | POA: Diagnosis not present

## 2021-05-19 DIAGNOSIS — Z4682 Encounter for fitting and adjustment of non-vascular catheter: Secondary | ICD-10-CM | POA: Diagnosis not present

## 2021-05-19 DIAGNOSIS — Z8719 Personal history of other diseases of the digestive system: Secondary | ICD-10-CM | POA: Diagnosis not present

## 2021-05-19 DIAGNOSIS — N186 End stage renal disease: Secondary | ICD-10-CM

## 2021-05-19 DIAGNOSIS — Z8249 Family history of ischemic heart disease and other diseases of the circulatory system: Secondary | ICD-10-CM

## 2021-05-19 DIAGNOSIS — K922 Gastrointestinal hemorrhage, unspecified: Secondary | ICD-10-CM | POA: Diagnosis not present

## 2021-05-19 DIAGNOSIS — D631 Anemia in chronic kidney disease: Secondary | ICD-10-CM

## 2021-05-19 DIAGNOSIS — I5042 Chronic combined systolic (congestive) and diastolic (congestive) heart failure: Secondary | ICD-10-CM | POA: Diagnosis present

## 2021-05-19 DIAGNOSIS — Z6821 Body mass index (BMI) 21.0-21.9, adult: Secondary | ICD-10-CM

## 2021-05-19 DIAGNOSIS — N281 Cyst of kidney, acquired: Secondary | ICD-10-CM | POA: Diagnosis not present

## 2021-05-19 DIAGNOSIS — K567 Ileus, unspecified: Secondary | ICD-10-CM | POA: Diagnosis not present

## 2021-05-19 DIAGNOSIS — G8929 Other chronic pain: Secondary | ICD-10-CM | POA: Diagnosis not present

## 2021-05-19 DIAGNOSIS — J9601 Acute respiratory failure with hypoxia: Secondary | ICD-10-CM | POA: Diagnosis not present

## 2021-05-19 DIAGNOSIS — E559 Vitamin D deficiency, unspecified: Secondary | ICD-10-CM | POA: Diagnosis not present

## 2021-05-19 DIAGNOSIS — I10 Essential (primary) hypertension: Secondary | ICD-10-CM | POA: Diagnosis not present

## 2021-05-19 DIAGNOSIS — R112 Nausea with vomiting, unspecified: Secondary | ICD-10-CM

## 2021-05-19 DIAGNOSIS — Z79899 Other long term (current) drug therapy: Secondary | ICD-10-CM

## 2021-05-19 DIAGNOSIS — R188 Other ascites: Secondary | ICD-10-CM | POA: Diagnosis present

## 2021-05-19 DIAGNOSIS — K5669 Other partial intestinal obstruction: Secondary | ICD-10-CM | POA: Diagnosis not present

## 2021-05-19 DIAGNOSIS — I5022 Chronic systolic (congestive) heart failure: Secondary | ICD-10-CM | POA: Diagnosis not present

## 2021-05-19 DIAGNOSIS — K3189 Other diseases of stomach and duodenum: Secondary | ICD-10-CM | POA: Diagnosis not present

## 2021-05-19 DIAGNOSIS — D509 Iron deficiency anemia, unspecified: Secondary | ICD-10-CM | POA: Diagnosis not present

## 2021-05-19 DIAGNOSIS — K2210 Ulcer of esophagus without bleeding: Secondary | ICD-10-CM | POA: Diagnosis present

## 2021-05-19 DIAGNOSIS — Z9049 Acquired absence of other specified parts of digestive tract: Secondary | ICD-10-CM | POA: Diagnosis not present

## 2021-05-19 DIAGNOSIS — E440 Moderate protein-calorie malnutrition: Secondary | ICD-10-CM | POA: Insufficient documentation

## 2021-05-19 DIAGNOSIS — K8020 Calculus of gallbladder without cholecystitis without obstruction: Secondary | ICD-10-CM | POA: Diagnosis not present

## 2021-05-19 DIAGNOSIS — K2211 Ulcer of esophagus with bleeding: Secondary | ICD-10-CM | POA: Diagnosis not present

## 2021-05-19 DIAGNOSIS — I120 Hypertensive chronic kidney disease with stage 5 chronic kidney disease or end stage renal disease: Secondary | ICD-10-CM | POA: Diagnosis not present

## 2021-05-19 DIAGNOSIS — Z452 Encounter for adjustment and management of vascular access device: Secondary | ICD-10-CM | POA: Diagnosis not present

## 2021-05-19 DIAGNOSIS — J811 Chronic pulmonary edema: Secondary | ICD-10-CM | POA: Diagnosis not present

## 2021-05-19 DIAGNOSIS — J9690 Respiratory failure, unspecified, unspecified whether with hypoxia or hypercapnia: Secondary | ICD-10-CM | POA: Diagnosis not present

## 2021-05-19 DIAGNOSIS — J449 Chronic obstructive pulmonary disease, unspecified: Secondary | ICD-10-CM | POA: Diagnosis present

## 2021-05-19 DIAGNOSIS — K82A1 Gangrene of gallbladder in cholecystitis: Secondary | ICD-10-CM | POA: Diagnosis present

## 2021-05-19 DIAGNOSIS — Z95810 Presence of automatic (implantable) cardiac defibrillator: Secondary | ICD-10-CM

## 2021-05-19 DIAGNOSIS — K6389 Other specified diseases of intestine: Secondary | ICD-10-CM | POA: Diagnosis not present

## 2021-05-19 DIAGNOSIS — I517 Cardiomegaly: Secondary | ICD-10-CM | POA: Diagnosis not present

## 2021-05-19 DIAGNOSIS — E876 Hypokalemia: Secondary | ICD-10-CM | POA: Diagnosis not present

## 2021-05-19 LAB — COMPREHENSIVE METABOLIC PANEL
ALT: 18 U/L (ref 0–44)
AST: 40 U/L (ref 15–41)
Albumin: 2.8 g/dL — ABNORMAL LOW (ref 3.5–5.0)
Alkaline Phosphatase: 118 U/L (ref 38–126)
Anion gap: 13 (ref 5–15)
BUN: 40 mg/dL — ABNORMAL HIGH (ref 8–23)
CO2: 29 mmol/L (ref 22–32)
Calcium: 8.7 mg/dL — ABNORMAL LOW (ref 8.9–10.3)
Chloride: 91 mmol/L — ABNORMAL LOW (ref 98–111)
Creatinine, Ser: 5.55 mg/dL — ABNORMAL HIGH (ref 0.61–1.24)
GFR, Estimated: 11 mL/min — ABNORMAL LOW (ref >60)
Glucose, Bld: 108 mg/dL — ABNORMAL HIGH (ref 70–99)
Potassium: 5 mmol/L (ref 3.5–5.1)
Sodium: 133 mmol/L — ABNORMAL LOW (ref 135–145)
Total Bilirubin: 2.5 mg/dL — ABNORMAL HIGH (ref 0.3–1.2)
Total Protein: 6.5 g/dL (ref 6.5–8.1)

## 2021-05-19 LAB — CBC
HCT: 24.5 % — ABNORMAL LOW (ref 39.0–52.0)
Hemoglobin: 8.7 g/dL — ABNORMAL LOW (ref 13.0–17.0)
MCH: 38.7 pg — ABNORMAL HIGH (ref 26.0–34.0)
MCHC: 35.5 g/dL (ref 30.0–36.0)
MCV: 108.9 fL — ABNORMAL HIGH (ref 80.0–100.0)
Platelets: 215 10*3/uL (ref 150–400)
RBC: 2.25 MIL/uL — ABNORMAL LOW (ref 4.22–5.81)
RDW: 18.4 % — ABNORMAL HIGH (ref 11.5–15.5)
WBC: 8.3 10*3/uL (ref 4.0–10.5)
nRBC: 0.5 % — ABNORMAL HIGH (ref 0.0–0.2)

## 2021-05-19 LAB — RESP PANEL BY RT-PCR (FLU A&B, COVID) ARPGX2
Influenza A by PCR: NEGATIVE
Influenza B by PCR: NEGATIVE
SARS Coronavirus 2 by RT PCR: NEGATIVE

## 2021-05-19 LAB — LIPASE, BLOOD: Lipase: 51 U/L (ref 11–51)

## 2021-05-19 MED ORDER — LIDOCAINE HCL URETHRAL/MUCOSAL 2 % EX GEL
1.0000 "application " | Freq: Once | CUTANEOUS | Status: AC
  Administered 2021-05-19: 1 via TOPICAL
  Filled 2021-05-19: qty 11

## 2021-05-19 MED ORDER — SODIUM CHLORIDE 0.9 % IV BOLUS
500.0000 mL | Freq: Once | INTRAVENOUS | Status: AC
  Administered 2021-05-19: 500 mL via INTRAVENOUS

## 2021-05-19 MED ORDER — UMECLIDINIUM BROMIDE 62.5 MCG/ACT IN AEPB
1.0000 | INHALATION_SPRAY | Freq: Every day | RESPIRATORY_TRACT | Status: DC
Start: 2021-05-20 — End: 2021-05-24
  Administered 2021-05-21: 08:00:00 1 via RESPIRATORY_TRACT
  Filled 2021-05-19: qty 7

## 2021-05-19 MED ORDER — MORPHINE SULFATE (PF) 4 MG/ML IV SOLN
3.0000 mg | INTRAVENOUS | Status: DC | PRN
  Administered 2021-05-19 – 2021-05-22 (×22): 3 mg via INTRAVENOUS
  Filled 2021-05-19 (×22): qty 1

## 2021-05-19 MED ORDER — ONDANSETRON HCL 4 MG/2ML IJ SOLN
4.0000 mg | Freq: Once | INTRAMUSCULAR | Status: AC
  Administered 2021-05-19: 4 mg via INTRAVENOUS
  Filled 2021-05-19: qty 2

## 2021-05-19 MED ORDER — FENTANYL CITRATE PF 50 MCG/ML IJ SOSY: 50.0000 ug | PREFILLED_SYRINGE | INTRAMUSCULAR | Status: DC | PRN

## 2021-05-19 MED ORDER — DIATRIZOATE MEGLUMINE & SODIUM 66-10 % PO SOLN
90.0000 mL | Freq: Once | ORAL | Status: AC
  Administered 2021-05-20: 90 mL via NASOGASTRIC
  Filled 2021-05-19: qty 90

## 2021-05-19 MED ORDER — FENTANYL CITRATE PF 50 MCG/ML IJ SOSY
50.0000 ug | PREFILLED_SYRINGE | Freq: Once | INTRAMUSCULAR | Status: AC
  Administered 2021-05-19: 50 ug via INTRAVENOUS
  Filled 2021-05-19: qty 1

## 2021-05-19 MED ORDER — NALOXONE HCL 0.4 MG/ML IJ SOLN: 0.4000 mg | INTRAMUSCULAR | Status: DC | PRN

## 2021-05-19 MED ORDER — FLUTICASONE FUROATE-VILANTEROL 100-25 MCG/ACT IN AEPB
1.0000 | INHALATION_SPRAY | Freq: Every day | RESPIRATORY_TRACT | Status: DC
Start: 2021-05-20 — End: 2021-05-24
  Administered 2021-05-21: 08:00:00 1 via RESPIRATORY_TRACT
  Filled 2021-05-19: qty 28

## 2021-05-19 MED ORDER — ACETAMINOPHEN 650 MG RE SUPP: 650.0000 mg | Freq: Four times a day (QID) | RECTAL | Status: DC | PRN

## 2021-05-19 MED ORDER — ALBUTEROL SULFATE (2.5 MG/3ML) 0.083% IN NEBU: 3.0000 mL | INHALATION_SOLUTION | Freq: Four times a day (QID) | RESPIRATORY_TRACT | Status: DC | PRN

## 2021-05-19 MED ORDER — IOHEXOL 300 MG/ML  SOLN
100.0000 mL | Freq: Once | INTRAMUSCULAR | Status: AC | PRN
  Administered 2021-05-19: 100 mL via INTRAVENOUS

## 2021-05-19 MED ORDER — BUDESON-GLYCOPYRROL-FORMOTEROL 160-9-4.8 MCG/ACT IN AERO: 2.0000 | INHALATION_SPRAY | Freq: Two times a day (BID) | RESPIRATORY_TRACT | Status: DC

## 2021-05-19 MED ORDER — ONDANSETRON HCL 4 MG/2ML IJ SOLN
4.0000 mg | Freq: Four times a day (QID) | INTRAMUSCULAR | Status: DC | PRN
  Administered 2021-05-20 – 2021-05-28 (×5): 4 mg via INTRAVENOUS
  Filled 2021-05-19 (×5): qty 2

## 2021-05-19 MED ORDER — ACETAMINOPHEN 325 MG PO TABS: 650.0000 mg | ORAL_TABLET | Freq: Four times a day (QID) | ORAL | Status: DC | PRN

## 2021-05-19 NOTE — ED Provider Notes (Signed)
Emergency Medicine Provider Triage Evaluation Note  Patrick [name] , a 65 y.o. male  was evaluated in triage.  Pt complains of abdominal pain.  States that pain initially started last week but has progressively worsened.  He endorses diffuse abdominal pain with vomiting.  Dors is abdominal swelling.  He denies diarrhea or bloody stools, hematemesis, fever.  He does state last bowel movement was yesterday.  Endorses passing gas.  Anticoagulated on Eliquis.  Review of Systems  Positive: See above Negative:   Physical Exam  There were no vitals taken for this visit. Gen:   Awake, leaning over in wheelchair guarding abdomen Resp:  Normal effort  MSK:   Moves extremities without difficulty  Other:  Abdomen is rounded, taut, tender to palpitation.  Decreased bowel sounds  Medical Decision Making  Medically screening exam initiated at 2:48 PM.  Appropriate orders placed.  Patrick [name] was informed that the remainder of the evaluation will be completed by another provider, this initial triage assessment does not replace that evaluation, and the importance of remaining in the ED until their evaluation is complete.  I was called by Dr. Zigmund [name] who is patient's insurance provider.  She states that she had a video visit with him this morning due to abdominal pain.  She states that he was recently admitted and discharged from [facility] in [place] after having common bile duct obstruction and stent placement.  She states that he has had small bowel obstruction previously and concern for high-grade small bowel obstruction now.  She advised him present to the emergency department for abdominal imaging.   Patrick Hillier, PA-C 05/19/21 1450    Patrick [name], Patrick Critchley, DO 05/19/21 1645

## 2021-05-19 NOTE — ED Provider Notes (Signed)
[facility] PROGRESSIVE CARE Provider Note   CSN: [id number] Arrival date & time: 05/19/21  1324     History Chief Complaint  Patient presents with   Small Bowel Obstruction    Margaret D Tufo is a 65 y.o. male.  Avier D Tiemann is a 65 y.o. male with hx of ESRD on HD, CHF, hypertension, sickle cell, who presents to the emergency department from primary care office for evaluation of worsening abdominal pain.  Patient was admitted to an outside hospital in [place] on 11/22 for low hemoglobin.  He was also found to have a small bowel obstruction which was treated conservatively with NG tube and was found to have choledocholithiasis.  Had ERCP done with stone sweeping and stenting of the common bile duct and pancreatic duct.  He reports he was discharged on 11/26 and has been doing much better until he started having increasing abdominal pain.  Reports he started having some vomiting on Wednesday and abdominal pain got progressively worse.  Was seen by his PCP and they were concerned for recurrence of small bowel obstruction and sent him back to the emergency department.  He reports he last had a bowel movement yesterday.  He is unsure when he was last able to pass gas.  He has not seen any blood in his stool or dark black stool.  Has had worsening vomiting today and has not been able to keep anything down.  No hematemesis but reports emesis has been foul-smelling.  He denies any chest pain or shortness of breath.  No fevers or chills.  He is accompanied by his nephew who also reports that he has been offered a cholecystectomy during his recent hospitalization but patient declined at that time.  Last completed dialysis yesterday.  The history is provided by the patient, a relative and medical records.      Past Medical History:  Diagnosis Date   Blood dyscrasia    Blood transfusion    "I've had a bunch of them"   CHF (congestive heart failure) ([place])    followed by hf clinic    Chronic kidney disease (CKD), stage III (moderate) ([place])    Dialysis patient ([place]) 04/2019   AV fistula (right arm)   Gout    Headache(784.0)    "probably weekly" (01/13/2014)   Legally blind    Shortness of breath dyspnea    Sickle cell disease, type SS (HCC)    Swelling abdomen    Swelling of extremity, left    Swelling of extremity, right     Patient Active Problem List   Diagnosis Date Noted   SBO (small bowel obstruction) ([place]) 05/19/2021   Acute upper GI bleed 08/02/2020   Prolonged QT interval 08/02/2020   GI bleed 08/02/2020   Hematemesis with nausea    Chronic anticoagulation    Tobacco dependence 05/28/2020   Muscle spasm of back 62/08/5595   Chronic systolic CHF (congestive heart failure) ([place]) 09/11/2019   Hypoxia 06/02/2018   Symptomatic anemia    Chronic heart failure with preserved ejection fraction ([place])    Colon cancer screening 07/16/2017   Encounter for therapeutic drug monitoring 12/29/2015   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   Permanent atrial fibrillation ([place]) 12/24/2015   Cough with sputum 10/25/2015   Muscle tightness-Right arm  05/27/2014   Arm pain, right 05/06/2014   End stage renal disease ([place]) 05/06/2014   Chest pain 01/13/2014   Vitamin D deficiency 09/25/2013   CKD (chronic kidney disease), stage IV (  [place]) 09/25/2013   Elevated uric acid in blood 09/25/2013   Hb-SS disease without crisis ([place]) 09/25/2013   Anemia 09/25/2013   Onychomycosis of toenail 08/27/2013   Cellulitis 08/27/2013   Hyperglycemia 08/27/2013   CHF (congestive heart failure) ([place]) 08/19/2013   Nausea & vomiting 02/07/2013   Sickle cell anemia ([place]) 11/21/2012   Abdominal pain 01/17/2012   NSVT (nonsustained ventricular tachycardia) 10/21/2011   Gout 10/19/2011   Sickle cell crisis ([place]) 10/17/2011   Acute on chronic systolic and diastolic heart failure, NYHA class 4 ([place]) 10/17/2011   Essential hypertension 06/07/2011    Past Surgical History:  Procedure Laterality  Date   A/V FISTULAGRAM Right 07/29/2020   Procedure: A/V FISTULAGRAM - Right Arm;  Surgeon: Angelia Mould, MD;  Location: [facility];  Service: Cardiovascular;  Laterality: Right;   BASCILIC VEIN TRANSPOSITION Right 04/12/2014   Procedure: BASILIC VEIN TRANSPOSITION;  Surgeon: Elam Dutch, MD;  Location: [facility];  Service: Vascular;  Laterality: Right;   BIOPSY  08/04/2020   Procedure: BIOPSY;  Surgeon: Ladene Artist, MD;  Location: [facility] ENDOSCOPY;  Service: Gastroenterology;;   CARDIAC CATHETERIZATION  05/2011   CARDIOVERSION N/A 06/05/2018   Procedure: CARDIOVERSION;  Surgeon: Jolaine Artist, MD;  Location: [facility];  Service: Cardiovascular;  Laterality: N/A;   CARDIOVERSION N/A 10/06/2019   Procedure: CARDIOVERSION;  Surgeon: Jolaine Artist, MD;  Location: [facility];  Service: Cardiovascular;  Laterality: N/A;   COLONOSCOPY WITH PROPOFOL N/A 09/24/2017   Procedure: COLONOSCOPY WITH PROPOFOL;  Surgeon: Jackquline Denmark, MD;  Location: [facility];  Service: Endoscopy;  Laterality: N/A;   ESOPHAGOGASTRODUODENOSCOPY N/A 09/19/2017   Procedure: ESOPHAGOGASTRODUODENOSCOPY (EGD);  Surgeon: Jackquline Denmark, MD;  Location: [facility] ENDOSCOPY;  Service: Endoscopy;  Laterality: N/A;   ESOPHAGOGASTRODUODENOSCOPY (EGD) WITH PROPOFOL N/A 08/04/2020   Procedure: ESOPHAGOGASTRODUODENOSCOPY (EGD) WITH PROPOFOL;  Surgeon: Ladene Artist, MD;  Location: [facility];  Service: Gastroenterology;  Laterality: N/A;   LEFT AND RIGHT HEART CATHETERIZATION WITH CORONARY ANGIOGRAM N/A 06/11/2011   Procedure: LEFT AND RIGHT HEART CATHETERIZATION WITH CORONARY ANGIOGRAM;  Surgeon: Larey Dresser, MD;  Location: [facility] CATH LAB;  Service: Cardiovascular;  Laterality: N/A;   PERIPHERAL VASCULAR BALLOON ANGIOPLASTY Right 07/29/2020   Procedure: PERIPHERAL VASCULAR BALLOON ANGIOPLASTY;  Surgeon: Angelia Mould, MD;  Location: [facility] LAB;  Service: Cardiovascular;  Laterality: Right;  arm  fistula   REVISON OF ARTERIOVENOUS FISTULA Right 08/08/2020   Procedure: ANEURYSM EXCISION OF RIGHT UPPER EXTREMITY ARTERIOVENOUS FISTULA;  Surgeon: Angelia Mould, MD;  Location: [facility];  Service: Vascular;  Laterality: Right;   TEE WITHOUT CARDIOVERSION N/A 12/27/2015   Procedure: TRANSESOPHAGEAL ECHOCARDIOGRAM (TEE);  Surgeon: Sueanne Margarita, MD;  Location: [facility] ENDOSCOPY;  Service: Cardiovascular;  Laterality: N/A;       Family History  Problem Relation Age of Onset   Alcohol abuse Mother    Liver disease Mother    Cirrhosis Mother    Heart attack Mother     Social History   Tobacco Use   Smoking status: Former    Packs/day: 0.25    Years: 41.00    Pack years: 10.25    Types: Cigarettes    Quit date: 12/08/2015    Years since quitting: 5.4   Smokeless tobacco: Never  Vaping Use   Vaping Use: Never used  Substance Use Topics   Alcohol use: Not Currently    Alcohol/week: 3.0 standard drinks    Types: 1 Glasses of wine, 2  Cans of beer per week    Comment: occasional   Drug use: No    Home Medications Prior to Admission medications   Medication Sig Start Date End Date Taking? Authorizing Provider  acetaminophen (TYLENOL) 500 MG tablet Take 1,000 mg by mouth every 6 (six) hours as needed for moderate pain or headache.   Yes [provider]  allopurinol (ZYLOPRIM) 100 MG tablet TAKE 1 TABLET BY MOUTH EVERY DAY 05/17/21  Yes Dorena Dew, FNP  AURYXIA 1 GM 210 MG(Fe) tablet Take 420 mg by mouth 3 (three) times daily. 02/11/21  Yes [provider]  diphenhydramine-acetaminophen (TYLENOL PM) 25-500 MG TABS tablet Take 2 tablets by mouth at bedtime as needed (sleep).   Yes [provider]  ELIQUIS 5 MG TABS tablet TAKE 1 TABLET BY MOUTH TWICE A DAY 02/21/21  Yes Bensimhon, Shaune Pascal, MD  folic acid (FOLVITE) 1 MG tablet TAKE 1 TABLET BY MOUTH EVERY DAY 02/22/21  Yes Dorena Dew, FNP  gabapentin (NEURONTIN) 100 MG capsule TAKE 1 CAPSULE BY  MOUTH EVERYDAY AT BEDTIME 12/12/20  Yes Dorena Dew, FNP  hydrALAZINE (APRESOLINE) 25 MG tablet Take 1 tablet (25 mg total) by mouth in the morning and at bedtime. DO NOT TAKE AM DOSES ON DIALYSIS DAYS Patient taking differently: Take 25 mg by mouth See admin instructions. Take 1 tablet by mouth twice daily all days, EXCEPT on dialysis day (T, Th, Sat) take in the evening only 01/27/20 05/19/21 Yes Bensimhon, Shaune Pascal, MD  hydroxyurea (HYDREA) 500 MG capsule Take 1 capsule (500 mg total) by mouth daily. TAKE 1 CAPSULE BY MOUTH EVERY DAY WITH FOOD TO MINIMIZE GI SIDE EFFECTS 01/31/21  Yes Dorena Dew, FNP  isosorbide mononitrate (IMDUR) 30 MG 24 hr tablet Take 1 tablet (30 mg total) by mouth daily. DO NOT TAKE AM ON DIALYSIS DAYS. 08/25/20  Yes Bensimhon, Shaune Pascal, MD  lidocaine-prilocaine (EMLA) cream Apply 1 application topically daily as needed (port access).  09/08/19  Yes [provider]  metolazone (ZAROXOLYN) 5 MG tablet Take 1 tablet (5 mg total) by mouth daily as needed. Patient taking differently: Take 5 mg by mouth daily as needed (fluid). 05/23/20  Yes Bensimhon, Shaune Pascal, MD  Oxycodone HCl 10 MG TABS Take 1 tablet (10 mg total) by mouth every 4 (four) hours as needed (pain). 05/17/21  Yes Dorena Dew, FNP  tiZANidine (ZANAFLEX) 4 MG tablet TAKE 0.5 TABLET BY MOUTH EVERY 8 HOURS AS NEEDED FOR MUSCLE SPASMS Patient taking differently: Take 2 mg by mouth every 8 (eight) hours as needed for muscle spasms. 02/22/21  Yes Dorena Dew, FNP  torsemide (DEMADEX) 20 MG tablet Take 80 mg by mouth daily. 03/31/20  Yes [provider]  Vitamin D, Ergocalciferol, (DRISDOL) 1.25 MG (50000 UNIT) CAPS capsule TAKE 1 CAPSULE BY MOUTH ONE TIME PER WEEK Patient taking differently: Take 50,000 Units by mouth every Saturday. 05/15/21  Yes Dorena Dew, FNP  albuterol (VENTOLIN HFA) 108 (90 Base) MCG/ACT inhaler Inhale 2 puffs into the lungs every 6 (six) hours as needed for  wheezing or shortness of breath. Patient not taking: Reported on 05/19/2021 05/09/20   Freddi Starr, MD  Budeson-Glycopyrrol-Formoterol (BREZTRI AEROSPHERE) 160-9-4.8 MCG/ACT AERO Inhale 2 puffs into the lungs in the morning and at bedtime. Patient not taking: Reported on 05/19/2021 12/21/20   Freddi Starr, MD  carvedilol (COREG) 12.5 MG tablet Take 1 tablet (12.5 mg total) by mouth 2 (two) times  daily with a meal. Patient not taking: Reported on 05/19/2021 05/16/21   Bensimhon, Shaune Pascal, MD    Allergies    Patient has no known allergies.  Review of Systems   Review of Systems  Constitutional:  Negative for chills and fever.  HENT: Negative.    Respiratory:  Negative for cough and shortness of breath.   Cardiovascular:  Negative for chest pain.  Gastrointestinal:  Positive for abdominal distention, abdominal pain, nausea and vomiting. Negative for blood in stool.  Musculoskeletal:  Negative for myalgias.  Skin:  Negative for color change.  Neurological:  Negative for syncope and light-headedness.  All other systems reviewed and are negative.  Physical Exam Updated Vital Signs BP 107/71 (BP Location: Left Arm)   Pulse 99   Temp 97.8 F (36.6 C) (Oral)   Resp 14   Ht 5\' 11"  (1.803 m)   Wt 67.9 kg   SpO2 100%   BMI 20.86 kg/m   Physical Exam Vitals and nursing note reviewed.  Constitutional:      General: He is not in acute distress.    Appearance: Normal appearance. He is well-developed. He is ill-appearing. He is not diaphoretic.     Comments: Alert, but ill-appearing, actively vomiting on my exam  HENT:     Head: Normocephalic and atraumatic.     Mouth/Throat:     Mouth: Mucous membranes are dry.  Eyes:     General:        Right eye: No discharge.        Left eye: No discharge.     Pupils: Pupils are equal, round, and reactive to light.  Cardiovascular:     Rate and Rhythm: Normal rate and regular rhythm.     Pulses: Normal pulses.     Heart sounds: Normal  heart sounds.  Pulmonary:     Effort: Pulmonary effort is normal. No respiratory distress.     Breath sounds: Normal breath sounds. No wheezing or rales.     Comments: Respirations equal and unlabored, patient able to speak in full sentences, lungs clear to auscultation bilaterally  Abdominal:     General: There is distension.     Palpations: Abdomen is soft. There is no mass.     Tenderness: There is abdominal tenderness. There is no guarding.     Comments: Abdomen is distended but soft, bowel sounds very hypoactive, generalized tenderness noted throughout that does not localize to 1 quadrant.  No guarding  Musculoskeletal:        General: No deformity.     Cervical back: Neck supple.  Skin:    General: Skin is warm and dry.     Capillary Refill: Capillary refill takes less than 2 seconds.  Neurological:     Mental Status: He is alert and oriented to person, place, and time.     Coordination: Coordination normal.     Comments: Speech is clear, able to follow commands Moves extremities without ataxia, coordination intact  Psychiatric:        Mood and Affect: Mood normal.        Behavior: Behavior normal.    ED Results / Procedures / Treatments   Labs (all labs ordered are listed, but only abnormal results are displayed) Labs Reviewed  COMPREHENSIVE METABOLIC PANEL - Abnormal; Notable for the following components:      Result Value   Sodium 133 (*)    Chloride 91 (*)    Glucose, Bld 108 (*)    BUN  40 (*)    Creatinine, Ser 5.55 (*)    Calcium 8.7 (*)    Albumin 2.8 (*)    Total Bilirubin 2.5 (*)    GFR, Estimated 11 (*)    All other components within normal limits  CBC - Abnormal; Notable for the following components:   RBC 2.25 (*)    Hemoglobin 8.7 (*)    HCT 24.5 (*)    MCV 108.9 (*)    MCH 38.7 (*)    RDW 18.4 (*)    nRBC 0.5 (*)    All other components within normal limits  RESP PANEL BY RT-PCR (FLU A&B, COVID) ARPGX2  LIPASE, BLOOD  URINALYSIS, ROUTINE W  REFLEX MICROSCOPIC  HIV ANTIBODY (ROUTINE TESTING W REFLEX)  PHOSPHORUS  MAGNESIUM  COMPREHENSIVE METABOLIC PANEL  CBC  PROTIME-INR  I-STAT CHEM 8, ED    EKG None  Radiology CT Abdomen Pelvis W Contrast  Result Date: 05/19/2021 CLINICAL DATA:  Abdominal pain with nausea and vomiting, possible small bowel obstruction EXAM: CT ABDOMEN AND PELVIS WITH CONTRAST TECHNIQUE: Multidetector CT imaging of the abdomen and pelvis was performed using the standard protocol following bolus administration of intravenous contrast. CONTRAST:  136mL OMNIPAQUE IOHEXOL 300 MG/ML  SOLN COMPARISON:  09/19/2017, plain film from earlier in the same day. FINDINGS: Lower chest: Mild bibasilar atelectasis right greater than left. No focal parenchymal nodule is seen. Hepatobiliary: Diffuse decreased attenuation is noted within the liver without focal mass likely related to fatty infiltration. Common bile duct stent is noted in place. Air is noted within the biliary tree as well as within the gallbladder related to the biliary stent. Multiple gallstones are seen. Perihepatic ascites is noted. Pancreas: Pancreatic stent is noted centrally. More peripheral pancreas is within normal limits. 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 Adrenals/Urinary Tract: Adrenal glands are within normal limits. Kidneys show no renal calculi or obstructive changes. Scattered cysts are noted bilaterally. Ureters are within normal limits. The bladder is partially distended. Stomach/Bowel: Colon shows no obstructive or inflammatory changes. The appendix is within normal limits without inflammatory change. Small bowel demonstrates diffuse small bowel dilatation with fecalization of bowel contents consistent with high-grade small bowel obstruction. There is a transition zone noted in the right mid abdomen best seen on image number 33 of series 6 and image number 42 of series 3. No discrete mass is noted in this region. Stomach is well  distended with fluid. Gastric catheter is noted within the distal esophagus and should be advanced several cm deeper into the stomach. Vascular/Lymphatic: Aortic atherosclerosis. No enlarged abdominal or pelvic lymph nodes. Reproductive: Prostate is unremarkable. Other: No discrete hernia is noted. Ascites is seen likely of a reactive nature related to the small-bowel obstruction. No findings to suggest perforation are noted at this time. Musculoskeletal: Patchy sclerotic changes are noted within the bony structures similar to that seen on prior exam. This is likely related to the patient's underlying known history of sickle cell. IMPRESSION: Changes consistent with a small-bowel obstruction in the mid ileum. This is high-grade in nature with fecalization of bowel contents. The transition zone shows no mass. These changes are likely related to adhesions or underlying scarring. Cholelithiasis with evidence of pneumobilia secondary to a biliary stent. Pancreatic stent is noted as well. Changes consistent with the known history of sickle cell disease. Gastric catheter is noted within the distal esophagus and should be advanced further into the stomach to allow for decompression. Electronically Signed   By: Elta Guadeloupe  Lukens M.D.   On: 05/19/2021 20:18   DG Abd Acute W/Chest  Addendum Date: 05/19/2021   ADDENDUM REPORT: 05/19/2021 17:32 ADDENDUM: These results were called by telephone at the time of interpretation on 05/19/2021 at 5:32 pm to provider [facility] , who verbally acknowledged these results. Electronically Signed   By: Zetta Bills M.D.   On: 05/19/2021 17:32   Result Date: 05/19/2021 CLINICAL DATA:  Small-bowel obstruction suspected. EXAM: DG ABDOMEN ACUTE WITH 1 VIEW CHEST COMPARISON:  Comparison is made with preoperative chest x-ray February 14th of 2022. FINDINGS: Trachea midline. Cardiomediastinal contours and hilar structures with mild cardiomegaly and central pulmonary vascular engorgement. No  signs of pneumothorax or consolidative changes. Juxta diaphragmatic atelectasis. No pneumothorax. No acute skeletal process about the bony thorax. Limited imaging due to patient position in stretcher due to patient condition by report. Gas beneath the LEFT hemidiaphragm, just below this are dilated loops of small bowel. This could reflect gas in the stomach move cephalad by the distended bowel loops. Biliary stent is in place to the RIGHT of L1, L2 and L3. Gallstone likely in the RIGHT upper quadrant. Pancreatic stent also in place. EKG leads project cross the patient's abdomen. Multiple air-fluid levels are present throughout the upper abdomen. Corresponding dilated loops of small bowel throughout the abdomen. There is some stool in the colon and a small amount of rectal stool and gas. On limited assessment there is no acute skeletal process. IMPRESSION: Signs of small bowel obstruction. Gas beneath the LEFT hemidiaphragm likely reflects gas in the stomach displaced cephalad by the distended bowel loops. Do not favor free air in this setting but would suggest CT for further evaluation in this patient with signs of bowel obstruction. Biliary and pancreatic stents. Gallstone in the RIGHT upper quadrant. A call is out to the referring provider to further discuss findings in the above case. Electronically Signed: By: Zetta Bills M.D. On: 05/19/2021 17:25   DG Abd Portable 1 View  Result Date: 05/19/2021 CLINICAL DATA:  Nasogastric tube placement EXAM: PORTABLE ABDOMEN - 1 VIEW COMPARISON:  X-ray abdomen 05/19/2021 FINDINGS: Enteric tube noted coursing below the hemidiaphragm with tip overlying the gastric lumen and side port likely in the region of the distal esophagus/gastroesophageal junction. Query gaseous distension of small bowel within the left upper abdomen. IMPRESSION: 1. Enteric tube coursing below the hemidiaphragm with tip overlying the gastric lumen and side port likely in the region of the distal  esophagus/gastroesophageal junction. Recommend advancement by 5 cm. 2. Query gaseous distension of small bowel within the left upper abdomen. Electronically Signed   By: Iven Finn M.D.   On: 05/19/2021 19:48    Procedures Procedures   Medications Ordered in ED Medications  acetaminophen (TYLENOL) tablet 650 mg (has no administration in time range)    Or  acetaminophen (TYLENOL) suppository 650 mg (has no administration in time range)  naloxone (NARCAN) injection 0.4 mg (has no administration in time range)  ondansetron (ZOFRAN) injection 4 mg (has no administration in time range)  albuterol (PROVENTIL) (2.5 MG/3ML) 0.083% nebulizer solution 3 mL (has no administration in time range)  morphine 4 MG/ML injection 3 mg (3 mg Intravenous Given 05/19/21 2231)  fluticasone furoate-vilanterol (BREO ELLIPTA) 100-25 MCG/ACT 1 puff (has no administration in time range)    And  umeclidinium bromide (INCRUSE ELLIPTA) 62.5 MCG/ACT 1 puff (has no administration in time range)  diatrizoate meglumine-sodium (GASTROGRAFIN) 66-10 % solution 90 mL (has no administration in time range)  ondansetron (  ZOFRAN) injection 4 mg (4 mg Intravenous Given 05/19/21 1903)  lidocaine (XYLOCAINE) 2 % jelly 1 application (1 application Topical Given 05/19/21 1920)  fentaNYL (SUBLIMAZE) injection 50 mcg (50 mcg Intravenous Given 05/19/21 1908)  iohexol (OMNIPAQUE) 300 MG/ML solution 100 mL (100 mLs Intravenous Contrast Given 05/19/21 1953)  sodium chloride 0.9 % bolus 500 mL (0 mLs Intravenous Stopped 05/19/21 2233)  fentaNYL (SUBLIMAZE) injection 50 mcg (50 mcg Intravenous Given 05/19/21 2047)    ED Course  I have reviewed the triage vital signs and the nursing notes.  Pertinent labs & imaging results that were available during my care of the patient were reviewed by me and considered in my medical decision making (see chart for details).    MDM Rules/Calculators/A&P                           65 y.o. male presents to  the ED with complaints of abdominal pain and vomiting, this involves an extensive number of treatment options, and is a complaint that carries with it a high risk of complications and morbidity.  The differential diagnosis includes recurrent SBO, gastritis, colitis, cholecystitis, choledocholithiasis, peptic ulcer, bowel perforation, ileus, appendicitis, diverticulitis  On arrival pt is ill-appearing and actively vomiting, vital stable on arrival. Exam significant for abdominal distention with minimal bowel sounds  Additional history obtained from medical records from outside hospital and family member at bedside. Previous records obtained and reviewed via EMR  I ordered pain and nausea medication and small fluid bolus.  Lab Tests:  I Ordered, reviewed, and interpreted labs, which included:  CBC: No leukocytosis, hemoglobin at baseline CMP: No significant electrolyte derangements, normal potassium after recent dialysis, elevated creatinine as expected with  ESRD LFTs overall unremarkable, does have T bili of 2.5 but this appears to be fairly chronic for the patient. Lipase: WNL  Imaging Studies ordered:  I ordered imaging studies which included acute abdominal series and CT abdomen pelvis, I independently visualized and interpreted imaging which showed   Acute abdominal series is consistent with small bowel obstruction, given patient's continued vomiting will go ahead and place NG tube.  CT abdomen pelvis: Small bowel obstruction noted, transition point in the mid ileum with fecalization of bowel contents, no mass noted in the transition zone, likely due to adhesions or underlying scarring, cholelithiasis with evidence of pneumobilia secondary to biliary stenting.  NG tube noted in the distal esophagus and should be advanced further to allow for decompression ED Course:    NG tube was advanced about 5 cm per radiology recommendation.  Vomiting has subsided and getting steady output from NG tube  that appears consistent with feculent gastric contents.  Will consult general surgery regarding small bowel obstruction.  Discussed findings and treatment plan with patient and family member at bedside.  He appears much more comfortable.  Case discussed with Dr. Bobbye Morton who will see patient in consult but agrees with plan for medicine admission.  I consulted Dr. Velia Meyer with [facility] hospitalist and discussed lab and imaging findings, he will see and admit the patient.     Portions of this note were generated with [employer]. Dictation errors may occur despite best attempts at proofreading.   Final Clinical Impression(s) / ED Diagnoses Final diagnoses:  SBO (small bowel obstruction) [facility])    Rx / DC Orders ED Discharge Orders     None        Jacqlyn Larsen, [place] 05/21/21 [id number]  Wyvonnia Dusky, MD 05/21/21 1501

## 2021-05-19 NOTE — ED Notes (Signed)
NG tube advanced 5cm. °

## 2021-05-19 NOTE — H&P (Signed)
History and Physical    PLEASE NOTE THAT DRAGON DICTATION SOFTWARE WAS USED IN THE CONSTRUCTION OF THIS NOTE.   Patrick [name] LPF:[id number] DOB: [birth year] DOA: 05/19/2021  PCP: Dorena Dew, FNP  Patient coming from: home   I have personally briefly reviewed patient's old medical records in [place]  Chief Complaint: Abdominal pain  HPI: Patrick [name] is a 66 y.o. male with medical history significant for end-stage renal disease on hemodialysis Tuesday, Thursday, Saturday schedule, anemia of chronic kidney disease with baseline hemoglobin 5-32, chronic systolic heart failure, chronic atrial fibrillation, chronically anticoagulated on Eliquis, who is admitted to [facility] on 05/19/2021 with small bowel obstruction after presenting from home to [facility] ED complaining of abdominal pain.   The patient was recently hospitalized at [facility] in [place] from 11/22 - 11/26 for small bowel obstruction, subsequently resolved with conservative measures, in addition to management of choledocholithiasis via ERCP stone removal and stents to the common bile duct as well as the pancreatic duct.  During this previous hospitalization, general surgery reportedly recommended cholecystectomy, with the patient ultimately refusing this measure.  Additionally, his hospital course was notable for acute on chronic anemia, with hemoglobin on day of admission of 5.7 requiring PRBC transfusion, with ensuing improvement in hemoglobin, with final value of 7.9 on the day of discharge, 05/13/2021.   Subsequently, resumption of his generalized abdominal discomfort over the course of the last 3 to 4 days.  Describes pain as, initially intermittent, but has become constant over the last 2 days.  Worse with palpation or attempts to eat or drink anything over that timeframe.  He also notes new nausea/vomiting over the last 2 days, reporting at least 5-6 episodes per day of nonbloody, nonbilious  emesis over that timeframe, with most recent episodes of vomiting occurring as discussed ED today.  He notes associated decline in flatus production, and states that most recent bowel occurred 2 to 3 days.  The setting of his nausea/vomiting, the patient has been unable to tolerate any p.o. over the last day, and consequently, unable to tolerate his outpatient prn oxycodone.  Not associate with any subjective fever, chills, rigors, generalized myalgias.  Medical history also notable for end-stage renal disease on hemodialysis and deconditioning to schedule, with patient confirming that he attended his most recent scheduled HD session yesterday, Thursday, 05/18/21.  Denies any associated chest pain, shortness of breath, palpitations, diaphoresis.  He also has a history of chronic systolic heart failure, with most recent echocardiogram in September 2021 notable for LVEF 40 to 45%, global hypokinesis, with left ventricular cavity size found to be moderately dilated, indeterminate diastolic parameters, right ventricular systolic function mildly reduced, severely dilated bilateral atria, and moderate to severe MR.      ED Course:  Vital signs in the ED were notable for the following: Afebrile; heart rate 86-93 blood pressure 90/61 -  117/77, with most recent BP noted to be 110/67; respiratory rate 13-14, oxygen saturation 100% on room air.  Labs were notable for the following: CMP notable for the following: Potassium 5.0, bicarbonate 29, anion gap 13, glucose 108, alk phos 118 AST 40, ALT, total bilirubin 2.5.  Lipase 51.  CBC notable for hemoglobin 8.7 compared to most recent prior value of 7.9 on 05/13/2021.  COVID-19/influenza PCR checked today were found to be negative.  Imaging and additional notable ED work-up: EKG shows atrial fibrillation with ventricular rate 91, QTc 419ms, and no evidence of T wave or ST  changes, including no evidence of ST elevation.  CT abdomen/pelvis was suggestive of small bowel  obstruction in the mid ileum, felt to be high-grade in nature, with transition zone noted; no evidence of associated bowel abscess or perforation; additionally, CT abdomen/pelvis showed biliary/pancreatic stents in place, while demonstrating cholelithiasis in the absence of any evidence of choledocholithiasis, and no evidence of acute cholecystitis.   While in the ED, the following were administered: EDP discussed the patient's case and imaging with the on-call general surgeon, Dr. Bobbye [place], who recommends admission to the hospitalist service for further evaluation and management small bowel obstruction, with plan for Dr.Lovick to formally consult.  Nasogastric tube placed in the ED, and attached to low intermittent wall suction.  Initially, patient was received the following in the ED: Fentanyl 50 mcg IV x2, Zofran 4 mg IV x1, and a 500 cc normal saline bolus.  Subsequently, he is being admitted for further evaluation and management of small bowel obstruction.      Review of Systems: As per HPI otherwise 10 point review of systems negative.   Past Medical History:  Diagnosis Date   Blood dyscrasia    Blood transfusion    "I've had a bunch of them"   CHF (congestive heart failure) ([place])    followed by hf clinic   Chronic kidney disease (CKD), stage III (moderate) ([place])    Dialysis patient ([place]) 04/2019   AV fistula (right arm)   Gout    Headache(784.0)    "probably weekly" (01/13/2014)   Legally blind    Shortness of breath dyspnea    Sickle cell disease, type SS (HCC)    Swelling abdomen    Swelling of extremity, left    Swelling of extremity, right     Past Surgical History:  Procedure Laterality Date   A/V FISTULAGRAM Right 07/29/2020   Procedure: A/V FISTULAGRAM - Right Arm;  Surgeon: Angelia Mould, MD;  Location: [facility];  Service: Cardiovascular;  Laterality: Right;   BASCILIC VEIN TRANSPOSITION Right 04/12/2014   Procedure: BASILIC VEIN TRANSPOSITION;   Surgeon: Elam Dutch, MD;  Location: [facility];  Service: Vascular;  Laterality: Right;   BIOPSY  08/04/2020   Procedure: BIOPSY;  Surgeon: Ladene Artist, MD;  Location: [facility] ENDOSCOPY;  Service: Gastroenterology;;   CARDIAC CATHETERIZATION  05/2011   CARDIOVERSION N/A 06/05/2018   Procedure: CARDIOVERSION;  Surgeon: Jolaine Artist, MD;  Location: [facility];  Service: Cardiovascular;  Laterality: N/A;   CARDIOVERSION N/A 10/06/2019   Procedure: CARDIOVERSION;  Surgeon: Jolaine Artist, MD;  Location: [facility];  Service: Cardiovascular;  Laterality: N/A;   COLONOSCOPY WITH PROPOFOL N/A 09/24/2017   Procedure: COLONOSCOPY WITH PROPOFOL;  Surgeon: Jackquline Denmark, MD;  Location: [facility];  Service: Endoscopy;  Laterality: N/A;   ESOPHAGOGASTRODUODENOSCOPY N/A 09/19/2017   Procedure: ESOPHAGOGASTRODUODENOSCOPY (EGD);  Surgeon: Jackquline Denmark, MD;  Location: [facility] ENDOSCOPY;  Service: Endoscopy;  Laterality: N/A;   ESOPHAGOGASTRODUODENOSCOPY (EGD) WITH PROPOFOL N/A 08/04/2020   Procedure: ESOPHAGOGASTRODUODENOSCOPY (EGD) WITH PROPOFOL;  Surgeon: Ladene Artist, MD;  Location: [place];  Service: Gastroenterology;  Laterality: N/A;   LEFT AND RIGHT HEART CATHETERIZATION WITH CORONARY ANGIOGRAM N/A 06/11/2011   Procedure: LEFT AND RIGHT HEART CATHETERIZATION WITH CORONARY ANGIOGRAM;  Surgeon: Larey Dresser, MD;  Location: [facility] CATH LAB;  Service: Cardiovascular;  Laterality: N/A;   PERIPHERAL VASCULAR BALLOON ANGIOPLASTY Right 07/29/2020   Procedure: PERIPHERAL VASCULAR BALLOON ANGIOPLASTY;  Surgeon: Angelia Mould, MD;  Location: [facility] INVASIVE CV  LAB;  Service: Cardiovascular;  Laterality: Right;  arm fistula   REVISON OF ARTERIOVENOUS FISTULA Right 08/08/2020   Procedure: ANEURYSM EXCISION OF RIGHT UPPER EXTREMITY ARTERIOVENOUS FISTULA;  Surgeon: Angelia Mould, MD;  Location: [facility];  Service: Vascular;  Laterality: Right;   TEE WITHOUT CARDIOVERSION N/A 12/27/2015    Procedure: TRANSESOPHAGEAL ECHOCARDIOGRAM (TEE);  Surgeon: Sueanne Margarita, MD;  Location: [facility] ENDOSCOPY;  Service: Cardiovascular;  Laterality: N/A;    Social History:  reports that he quit smoking about 5 years ago. His smoking use included cigarettes. He has a 10.25 pack-year smoking history. He has never used smokeless tobacco. He reports that he does not currently use alcohol after a past usage of about 3.0 standard drinks per week. He reports that he does not use drugs.   No Known Allergies  Family History  Problem Relation Age of Onset   Alcohol abuse Mother    Liver disease Mother    Cirrhosis Mother    Heart attack Mother       Prior to Admission medications   Medication Sig Start Date End Date Taking? Authorizing Provider  acetaminophen (TYLENOL) 500 MG tablet Take 1,000 mg by mouth every 6 (six) hours as needed for moderate pain or headache.    [provider]  albuterol (VENTOLIN HFA) 108 (90 Base) MCG/ACT inhaler Inhale 2 puffs into the lungs every 6 (six) hours as needed for wheezing or shortness of breath. 05/09/20   Freddi Starr, MD  allopurinol (ZYLOPRIM) 100 MG tablet TAKE 1 TABLET BY MOUTH EVERY DAY 05/17/21   Dorena Dew, FNP  AURYXIA 1 GM 210 MG(Fe) tablet Take 420 mg by mouth 3 (three) times daily. 02/11/21   [provider]  Budeson-Glycopyrrol-Formoterol (BREZTRI AEROSPHERE) 160-9-4.8 MCG/ACT AERO Inhale 2 puffs into the lungs in the morning and at bedtime. 12/21/20   Freddi Starr, MD  carvedilol (COREG) 12.5 MG tablet Take 1 tablet (12.5 mg total) by mouth 2 (two) times daily with a meal. 05/16/21   Bensimhon, Shaune Pascal, MD  diphenhydramine-acetaminophen (TYLENOL PM) 25-500 MG TABS tablet Take 2 tablets by mouth at bedtime as needed (sleep).    [provider]  ELIQUIS 5 MG TABS tablet TAKE 1 TABLET BY MOUTH TWICE A DAY 02/21/21   Bensimhon, Shaune Pascal, MD  folic acid (FOLVITE) 1 MG tablet TAKE 1 TABLET BY MOUTH EVERY DAY 02/22/21    Dorena Dew, FNP  gabapentin (NEURONTIN) 100 MG capsule TAKE 1 CAPSULE BY MOUTH EVERYDAY AT BEDTIME 12/12/20   Dorena Dew, FNP  hydrALAZINE (APRESOLINE) 25 MG tablet Take 1 tablet (25 mg total) by mouth in the morning and at bedtime. DO NOT TAKE AM DOSES ON DIALYSIS DAYS Patient taking differently: Take 25 mg by mouth See admin instructions. Take 1 tablet by mouth twice daily all days, EXCEPT on dialysis day (T, Th, Sat) take in the evening only 01/27/20 05/23/20  Bensimhon, Shaune Pascal, MD  hydroxyurea (HYDREA) 500 MG capsule Take 1 capsule (500 mg total) by mouth daily. TAKE 1 CAPSULE BY MOUTH EVERY DAY WITH FOOD TO MINIMIZE GI SIDE EFFECTS 01/31/21   Dorena Dew, FNP  isosorbide mononitrate (IMDUR) 30 MG 24 hr tablet Take 1 tablet (30 mg total) by mouth daily. DO NOT TAKE AM ON DIALYSIS DAYS. 08/25/20   Bensimhon, Shaune Pascal, MD  lidocaine-prilocaine (EMLA) cream Apply 1 application topically daily as needed (port access).  09/08/19   [provider]  metolazone (ZAROXOLYN) 5 MG tablet  Take 1 tablet (5 mg total) by mouth daily as needed. Patient taking differently: Take 5 mg by mouth daily as needed (fluid). 05/23/20   Bensimhon, Shaune Pascal, MD  Oxycodone HCl 10 MG TABS Take 1 tablet (10 mg total) by mouth every 4 (four) hours as needed (pain). 05/17/21   Dorena Dew, FNP  tiZANidine (ZANAFLEX) 4 MG tablet TAKE 0.5 TABLET BY MOUTH EVERY 8 HOURS AS NEEDED FOR MUSCLE SPASMS 02/22/21   Dorena Dew, FNP  torsemide (DEMADEX) 20 MG tablet Take 80 mg by mouth daily. 03/31/20   [provider]  Vitamin D, Ergocalciferol, (DRISDOL) 1.25 MG (50000 UNIT) CAPS capsule TAKE 1 CAPSULE BY MOUTH ONE TIME PER WEEK 05/15/21   Dorena Dew, FNP     Objective    Physical Exam: Vitals:   05/19/21 1639 05/19/21 1900 05/19/21 1922 05/19/21 2045  BP: 117/77 90/61  110/67  Pulse: 91 88  93  Resp: (!) 26 13  11   Temp:      SpO2: 100% 100%  100%  Weight:   67.6 kg    Height:   5' 11"  (1.803 m)     General: appears to be stated age; alert, oriented; NGT removed, Skin: warm, dry, no rash Head:  AT/[place] Mouth:  Oral mucosa membranes appear dry, normal dentition Neck: supple; trachea midline Heart: Irregular; did not appreciate any M/R/G Lungs: CTAB, did not appreciate any wheezes, rales, or rhonchi Abdomen: Hypoactive bowel sounds, soft, ND; generalized abdominal tenderness to palpation, in the absence of any associated guarding, rigidity, or rebound tenderness Vascular: 2+ pedal pulses b/l; 2+ radial pulses b/l Extremities: no peripheral edema, no muscle wasting Neuro: strength and sensation intact in upper and lower extremities b/l    Labs on Admission: I have personally reviewed following labs and imaging studies  CBC: Recent Labs  Lab 05/19/21 1445  WBC 8.3  HGB 8.7*  HCT 24.5*  MCV 108.9*  PLT 353   Basic Metabolic Panel: Recent Labs  Lab 05/19/21 1445  NA 133*  K 5.0  CL 91*  CO2 29  GLUCOSE 108*  BUN 40*  CREATININE 5.55*  CALCIUM 8.7*   GFR: Estimated Creatinine Clearance: 13 mL/min (A) (by C-G formula based on SCr of 5.55 mg/dL (H)). Liver Function Tests: Recent Labs  Lab 05/19/21 1445  AST 40  ALT 18  ALKPHOS 118  BILITOT 2.5*  PROT 6.5  ALBUMIN 2.8*   Recent Labs  Lab 05/19/21 1445  LIPASE 51   No results for input(s): AMMONIA in the last 168 hours. Coagulation Profile: No results for input(s): INR, PROTIME in the last 168 hours. Cardiac Enzymes: No results for input(s): CKTOTAL, CKMB, CKMBINDEX, TROPONINI in the last 168 hours. BNP (last 3 results) No results for input(s): PROBNP in the last 8760 hours. HbA1C: No results for input(s): HGBA1C in the last 72 hours. CBG: No results for input(s): GLUCAP in the last 168 hours. Lipid Profile: No results for input(s): CHOL, HDL, LDLCALC, TRIG, CHOLHDL, LDLDIRECT in the last 72 hours. Thyroid Function Tests: No results for input(s): TSH, T4TOTAL, FREET4,  T3FREE, THYROIDAB in the last 72 hours. Anemia Panel: No results for input(s): VITAMINB12, FOLATE, FERRITIN, TIBC, IRON, RETICCTPCT in the last 72 hours. Urine analysis:    Component Value Date/Time   COLORURINE STRAW (A) 06/02/2018 1900   APPEARANCEUR CLEAR 06/02/2018 1900   APPEARANCEUR Clear 05/31/2017 0902   LABSPEC 1.006 06/02/2018 1900   PHURINE 7.0 06/02/2018 1900   GLUCOSEU NEGATIVE 06/02/2018  [place] (A) 06/02/2018 1900   BILIRUBINUR neg 01/26/2020 1124   BILIRUBINUR Negative 05/31/2017 0902   KETONESUR negative 09/08/2018 1621   KETONESUR NEGATIVE 06/02/2018 1900   PROTEINUR Positive (A) 01/26/2020 1124   PROTEINUR 30 (A) 06/02/2018 1900   UROBILINOGEN 1.0 01/26/2020 1124   UROBILINOGEN 0.2 09/14/2013 1354   NITRITE neg 01/26/2020 1124   NITRITE NEGATIVE 06/02/2018 1900   LEUKOCYTESUR Negative 01/26/2020 1124   LEUKOCYTESUR Negative 05/31/2017 0902    Radiological Exams on Admission: CT Abdomen Pelvis W Contrast  Result Date: 05/19/2021 CLINICAL DATA:  Abdominal pain with nausea and vomiting, possible small bowel obstruction EXAM: CT ABDOMEN AND PELVIS WITH CONTRAST TECHNIQUE: Multidetector CT imaging of the abdomen and pelvis was performed using the standard protocol following bolus administration of intravenous contrast. CONTRAST:  180mL OMNIPAQUE IOHEXOL 300 MG/ML  SOLN COMPARISON:  09/19/2017, plain film from earlier in the same day. FINDINGS: Lower chest: Mild bibasilar atelectasis right greater than left. No focal parenchymal nodule is seen. Hepatobiliary: Diffuse decreased attenuation is noted within the liver without focal mass likely related to fatty infiltration. Common bile duct stent is noted in place. Air is noted within the biliary tree as well as within the gallbladder related to the biliary stent. Multiple gallstones are seen. Perihepatic ascites is noted. Pancreas: Pancreatic stent is noted centrally. More peripheral pancreas is within normal  limits. 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 Adrenals/Urinary Tract: Adrenal glands are within normal limits. Kidneys show no renal calculi or obstructive changes. Scattered cysts are noted bilaterally. Ureters are within normal limits. The bladder is partially distended. Stomach/Bowel: Colon shows no obstructive or inflammatory changes. The appendix is within normal limits without inflammatory change. Small bowel demonstrates diffuse small bowel dilatation with fecalization of bowel contents consistent with high-grade small bowel obstruction. There is a transition zone noted in the right mid abdomen best seen on image number 33 of series 6 and image number 42 of series 3. No discrete mass is noted in this region. Stomach is well distended with fluid. Gastric catheter is noted within the distal esophagus and should be advanced several cm deeper into the stomach. Vascular/Lymphatic: Aortic atherosclerosis. No enlarged abdominal or pelvic lymph nodes. Reproductive: Prostate is unremarkable. Other: No discrete hernia is noted. Ascites is seen likely of a reactive nature related to the small-bowel obstruction. No findings to suggest perforation are noted at this time. Musculoskeletal: Patchy sclerotic changes are noted within the bony structures similar to that seen on prior exam. This is likely related to the patient's underlying known history of sickle cell. IMPRESSION: Changes consistent with a small-bowel obstruction in the mid ileum. This is high-grade in nature with fecalization of bowel contents. The transition zone shows no mass. These changes are likely related to adhesions or underlying scarring. Cholelithiasis with evidence of pneumobilia secondary to a biliary stent. Pancreatic stent is noted as well. Changes consistent with the known history of sickle cell disease. Gastric catheter is noted within the distal esophagus and should be advanced further into the stomach to allow for  decompression. Electronically Signed   By: Inez Catalina M.D.   On: 05/19/2021 20:18   DG Abd Acute W/Chest  Addendum Date: 05/19/2021   ADDENDUM REPORT: 05/19/2021 17:32 ADDENDUM: These results were called by telephone at the time of interpretation on 05/19/2021 at 5:32 pm to provider [facility] , who verbally acknowledged these results. Electronically Signed   By: Zetta Bills M.D.   On: 05/19/2021  17:32   Result Date: 05/19/2021 CLINICAL DATA:  Small-bowel obstruction suspected. EXAM: DG ABDOMEN ACUTE WITH 1 VIEW CHEST COMPARISON:  Comparison is made with preoperative chest x-ray February 14th of 2022. FINDINGS: Trachea midline. Cardiomediastinal contours and hilar structures with mild cardiomegaly and central pulmonary vascular engorgement. No signs of pneumothorax or consolidative changes. Juxta diaphragmatic atelectasis. No pneumothorax. No acute skeletal process about the bony thorax. Limited imaging due to patient position in stretcher due to patient condition by report. Gas beneath the LEFT hemidiaphragm, just below this are dilated loops of small bowel. This could reflect gas in the stomach move cephalad by the distended bowel loops. Biliary stent is in place to the RIGHT of L1, L2 and L3. Gallstone likely in the RIGHT upper quadrant. Pancreatic stent also in place. EKG leads project cross the patient's abdomen. Multiple air-fluid levels are present throughout the upper abdomen. Corresponding dilated loops of small bowel throughout the abdomen. There is some stool in the colon and a small amount of rectal stool and gas. On limited assessment there is no acute skeletal process. IMPRESSION: Signs of small bowel obstruction. Gas beneath the LEFT hemidiaphragm likely reflects gas in the stomach displaced cephalad by the distended bowel loops. Do not favor free air in this setting but would suggest CT for further evaluation in this patient with signs of bowel obstruction. Biliary and pancreatic stents.  Gallstone in the RIGHT upper quadrant. A call is out to the referring provider to further discuss findings in the above case. Electronically Signed: By: Zetta Bills M.D. On: 05/19/2021 17:25   DG Abd Portable 1 View  Result Date: 05/19/2021 CLINICAL DATA:  Nasogastric tube placement EXAM: PORTABLE ABDOMEN - 1 VIEW COMPARISON:  X-ray abdomen 05/19/2021 FINDINGS: Enteric tube noted coursing below the hemidiaphragm with tip overlying the gastric lumen and side port likely in the region of the distal esophagus/gastroesophageal junction. Query gaseous distension of small bowel within the left upper abdomen. IMPRESSION: 1. Enteric tube coursing below the hemidiaphragm with tip overlying the gastric lumen and side port likely in the region of the distal esophagus/gastroesophageal junction. Recommend advancement by 5 cm. 2. Query gaseous distension of small bowel within the left upper abdomen. Electronically Signed   By: Iven Finn M.D.   On: 05/19/2021 19:48     EKG: Independently reviewed, with result as described above.    Assessment/Plan   Principal Problem:   SBO (small bowel obstruction) (HCC) Active Problems:   Abdominal pain   Nausea & vomiting   Anemia   End stage renal disease (HCC)   Permanent atrial fibrillation (HCC)   Chronic systolic CHF (congestive heart failure) (HCC)     #) Small bowel obstruction: dx on the basis of 3 to 4 days of new onset abdominal discomfort associated with nausea/vomiting, diminished flatus production, with imaging in the ED today, including CT abdomen/pelvis showing evidence of high-grade small bowel obstruction in the mid ileum with transition zone bowel abscess or perforation.  Postop adhesions, and it is noted that patient was recently hospitalized in [place] for small bowel obstruction that resolved with conservative measures. No evidence of peritoneal signs on today's physical exam. Will initiate conservative measures, and observe for return of  normal bowel function, as described below. NGT placed in the ED, and attached to low intermittent wall suction.   case and imaging were discussed with the on-call general surgeon, Dr.Lovick, who recommended admission to the hospitalist service for further evaluation and management of presenting SBO, with plan  Dr. Bobbye [place] to formally consult.  Given the patient's history of end-stage renal disease and hemodialysis as well as chronic systolic heart failure in the context of current hemodynamic stability administered earlier today's ED course, will refrain from initiation of continuous IV fluids for now, with close monitoring of interval vital sign trend. Discussions with the patient, he conveys that IV morphine was more effective than IV fentanyl for his abdominal discomfort during his recent for SBO.    Plan: Strict NPO.  Prn IV morphine. prn IV Zofran. NG tube attached to low, intermittent wall suction. Recheck CMP and CBC in the morning. Check INR. General surgery consulted, as above.      #) End-stage renal disease: on hemodialysis, on Tuesday, Thursday, Saturday schedule.  He was recently in tenderness scheduled for Thursday HD session, and is due for his next scheduled hemodialysis tomorrow (Saturday, 05/20/21).  No clinical or laboratory evidence to suggest need for urgent overnight hemodialysis at this time, including no evidence of hyperkalemia, anion gap metabolic acidosis, or acute volume overload.   Plan: Monitor strict I's and O's and daily weights.  Repeat CMP in the morning.  Check serum magnesium and phosphorus levels. I notified on-call nephrologist of this HD patient who will be due for next scheduled HD session tomorrow morning (05/20/21).        #) Chronic anemia: Appears multifactorial, with contributions from anemia of chronic kidney disease as well as sickle cell anemia, associated baseline hemoglobin range of 8-10, with presenting hemoglobin of 8.7 consistent with this range.   This is in the context of requiring PRBC transfusion during his recent hospitalization in [place], after presenting with hemoglobin of 5.7, with ensuing improvement to final hemoglobin of 7.9 on day of discharge.   Plan: Repeat CBC in the morning.  Check INR.        #) Chronic systolic heart failure: documented history of such, with most recent echocardiogram performed in September 2021 notable for LVEF 40 to 45% and indeterminate diastolic parameters, with additional findings as further detailed above. No clinical evidence to suggest acutely decompensated heart failure at this time.  Of note, do for neck scheduled hemodialysis session tomorrow, as above.  Medical management includes Coreg hydralazine, and Imdur.   Plan: monitor strict I's & O's and daily weights. Repeat BMP in the morning. Check serum magnesium level.  Holding home antihypertensives for now in the setting of n.p.o. status as component of management of presenting high-grade SBO.  Close monitoring of ensuing blood pressure.         #) Chronic atrial fibrillation: Documented history of such. In the setting of a CHA2DS2-VASc score of 3, there is an indication for the patient to be on chronic anticoagulation for thromboembolic prophylaxis. Consistent with this, the patient is chronically anticoagulated on Eliquis. Home AV nodal blocking regimen: Coreg.  Most recent echocardiogram occurred in September 2021, notable for severely dilated bilateral atria as well as moderate to severe mitral regurgitation, with additional details as conveyed above. Presenting EKG consistent with rate controlled atrial fibrillation, without evidence of acute ischemic changes.  Will reassess clinical status regarding presenting small bowel obstruction in the morning and reevaluate ability to take p.o. vs consideration for heparin drip for continuation of associated chronic anticoagulation.   Plan: monitor strict I's & O's and daily weights. Repeat  BMP and CBC in the morning. Check serum magnesium level.  Holding home Coreg and Eliquis for now, as further detailed above.  Monitor on telemetry.        #)  COPD: In the context of being a former smoker, having smoked for 40 years before quitting completely in 2017.  Outpatient respiratory regimen consists of Breztri as well as as needed albuterol inhaler.  No clinical evidence to suggest acute COPD exacerbation at this time.  Plan: Continue home respiratory regimen.  Check serum phosphorus level.      DVT prophylaxis: SCD's   Code Status: Full code Family Communication: none Disposition Plan: Per Rounding Team Consults called:  Dr. Bobbye Morton of gen surg consulted, as further detailed above;  Admission status: Inpatient;   Warrants inpatient status on basis of need for further evaluation and management of presenting small bowel obstruction, including delivery of measures to include NG tube placement/attachment to will suction, and prn/prn IV antiemetics patient's current n.p.o. status.    PLEASE NOTE THAT DRAGON DICTATION SOFTWARE WAS USED IN THE CONSTRUCTION OF THIS NOTE.   Sadorus DO [facility]  From [place]   05/19/2021, 9:18 PM

## 2021-05-19 NOTE — ED Triage Notes (Signed)
Patient here for evaluation of abdominal pain and weakness that started early last week and patient states his symptoms have gotten worse. Also reports vomiting, denies diarrhea, denies bloody stools, does take eliquis. Patient alert, oriented, and in no apparent distress at this time.

## 2021-05-19 NOTE — Consult Note (Signed)
Reason for Consult/Chief Complaint: SBO Consultant: Horton, DO  Zubayr D Melland is an 65 y.o. male.   HPI: 79M on Eliquis for AF, also with ESRD on TTS HD via RUE AVF. Virtual visit with PCP today after recent discharge from [facility] for SBO nonoperatively managed. PCP concern for recurrence of SBO due to patient report of abdominal pain, so recommended ED eval. During this hospitalization, patient had choledocholithiasis and underwent CBD and pancreatic duct stents placement. Cholecystectomy was offered, but declined by patient and family because "they rushed it on me". Last HD session 12/1. No prior abdominal surgery.    Past Medical History:  Diagnosis Date   Blood dyscrasia    Blood transfusion    "I've had a bunch of them"   CHF (congestive heart failure) ([place])    followed by hf clinic   Chronic kidney disease (CKD), stage III (moderate) ([place])    Dialysis patient ([place]) 04/2019   AV fistula (right arm)   Gout    Headache(784.0)    "probably weekly" (01/13/2014)   Legally blind    Shortness of breath dyspnea    Sickle cell disease, type SS (HCC)    Swelling abdomen    Swelling of extremity, left    Swelling of extremity, right     Past Surgical History:  Procedure Laterality Date   A/V FISTULAGRAM Right 07/29/2020   Procedure: A/V FISTULAGRAM - Right Arm;  Surgeon: Angelia Mould, MD;  Location: [place] [facility];  Service: Cardiovascular;  Laterality: Right;   BASCILIC VEIN TRANSPOSITION Right 04/12/2014   Procedure: BASILIC VEIN TRANSPOSITION;  Surgeon: Elam Dutch, MD;  Location: [place];  Service: Vascular;  Laterality: Right;   BIOPSY  08/04/2020   Procedure: BIOPSY;  Surgeon: Ladene Artist, MD;  Location: [facility] ENDOSCOPY;  Service: Gastroenterology;;   CARDIAC CATHETERIZATION  05/2011   CARDIOVERSION N/A 06/05/2018   Procedure: CARDIOVERSION;  Surgeon: Jolaine Artist, MD;  Location: [place];  Service: Cardiovascular;  Laterality: N/A;    CARDIOVERSION N/A 10/06/2019   Procedure: CARDIOVERSION;  Surgeon: Jolaine Artist, MD;  Location: [place];  Service: Cardiovascular;  Laterality: N/A;   COLONOSCOPY WITH PROPOFOL N/A 09/24/2017   Procedure: COLONOSCOPY WITH PROPOFOL;  Surgeon: Jackquline Denmark, MD;  Location: [place];  Service: Endoscopy;  Laterality: N/A;   ESOPHAGOGASTRODUODENOSCOPY N/A 09/19/2017   Procedure: ESOPHAGOGASTRODUODENOSCOPY (EGD);  Surgeon: Jackquline Denmark, MD;  Location[facility];  Service: Endoscopy;  Laterality: N/A;   ESOPHAGOGASTRODUODENOSCOPY (EGD) WITH PROPOFOL N/A 08/04/2020   Procedure: ESOPHAGOGASTRODUODENOSCOPY (EGD) WITH PROPOFOL;  Surgeon: Ladene Artist, MD;  Location: [place];  Service: Gastroenterology;  Laterality: N/A;   LEFT AND RIGHT HEART CATHETERIZATION WITH CORONARY ANGIOGRAM N/A 06/11/2011   Procedure: LEFT AND RIGHT HEART CATHETERIZATION WITH CORONARY ANGIOGRAM;  Surgeon: Larey Dresser, MD;  Location: [facility] CATH LAB;  Service: Cardiovascular;  Laterality: N/A;   PERIPHERAL VASCULAR BALLOON ANGIOPLASTY Right 07/29/2020   Procedure: PERIPHERAL VASCULAR BALLOON ANGIOPLASTY;  Surgeon: Angelia Mould, MD;  Location: [place] [facility];  Service: Cardiovascular;  Laterality: Right;  arm fistula   REVISON OF ARTERIOVENOUS FISTULA Right 08/08/2020   Procedure: ANEURYSM EXCISION OF RIGHT UPPER EXTREMITY ARTERIOVENOUS FISTULA;  Surgeon: Angelia Mould, MD;  Location: [place];  Service: Vascular;  Laterality: Right;   TEE WITHOUT CARDIOVERSION N/A 12/27/2015   Procedure: TRANSESOPHAGEAL ECHOCARDIOGRAM (TEE);  Surgeon: Sueanne Margarita, MD;  Location: [facility] END[facility]COPY;  Service: Cardiovascular;  Laterality: N/A;    Family History  Problem Relation Age of Onset   Alcohol abuse Mother    Liver disease Mother    Cirrhosis Mother    Heart attack Mother     Social History:  reports that he quit smoking about 5 years ago. His smoking use included cigarettes. He has a 10.25 pack-year  smoking history. He has never used smokeless tobacco. He reports that he does not currently use alcohol after a past usage of about 3.0 standard drinks per week. He reports that he does not use drugs.  Allergies: No Known Allergies  Medications: I have reviewed the patient's current medications.  Results for orders placed or performed during the hospital encounter of 05/19/21 (from the past 48 hour(s))  Lipase, blood     Status: None   Collection Time: 05/19/21  2:45 PM  Result Value Ref Range   Lipase 51 11 - 51 U/L    Comment: Performed at [facility], [address] [place], [place] [address]  Comprehensive metabolic panel     Status: Abnormal   Collection Time: 05/19/21  2:45 PM  Result Value Ref Range   Sodium 133 (L) 135 - 145 mmol/L   Potassium 5.0 3.5 - 5.1 mmol/L   Chloride 91 (L) 98 - 111 mmol/L   CO2 29 22 - 32 mmol/L   Glucose, Bld 108 (H) 70 - 99 mg/dL    Comment: Glucose reference range applies only to samples taken after fasting for at least 8 hours.   BUN 40 (H) 8 - 23 mg/dL   Creatinine, Ser 5.55 (H) 0.61 - 1.24 mg/dL   Calcium 8.7 (L) 8.9 - 10.3 mg/dL   Total Protein 6.5 6.5 - 8.1 g/dL   Albumin 2.8 (L) 3.5 - 5.0 g/dL   AST 40 15 - 41 U/L   ALT 18 0 - 44 U/L   Alkaline Phosphatase 118 38 - 126 U/L   Total Bilirubin 2.5 (H) 0.3 - 1.2 mg/dL   GFR, Estimated 11 (L) >60 mL/min    Comment: (NOTE) Calculated using the CKD-EPI Creatinine Equation (2021)    Anion gap 13 5 - 15    Comment: Performed at [facility], [place] [address], [place], [place] [address]  CBC     Status: Abnormal   Collection Time: 05/19/21  2:45 PM  Result Value Ref Range   WBC 8.3 4.0 - 10.5 K/uL   RBC 2.25 (L) 4.22 - 5.81 MIL/uL   Hemoglobin 8.7 (L) 13.0 - 17.0 g/dL   HCT 24.5 (L) 39.0 - 52.0 %   MCV 108.9 (H) 80.0 - 100.0 fL   MCH 38.7 (H) 26.0 - 34.0 pg   MCHC 35.5 30.0 - 36.0 g/dL   RDW 18.4 (H) 11.5 - 15.5 %   Platelets 215 150 - 400 K/uL   nRBC 0.5 (H) 0.0 - 0.2 %     Comment: Performed at [facility], [place] [address] [place], [place] [address]  Resp Panel by RT-PCR (Flu A&B, Covid) Nasopharyngeal Swab     Status: None   Collection Time: 05/19/21  6:29 PM   Specimen: Nasopharyngeal Swab; Nasopharyngeal(NP) swabs in vial transport medium  Result Value Ref Range   SARS Coronavirus 2 by RT PCR NEGATIVE NEGATIVE    Comment: (NOTE) SARS-CoV-2 target nucleic acids are NOT DETECTED.  The SARS-CoV-2 RNA is generally detectable in upper respiratory specimens during the acute phase of infection. The lowest concentration of SARS-CoV-2 viral copies this assay can detect is 138  copies/mL. A negative result does not preclude SARS-Cov-2 infection and should not be used as the sole basis for treatment or other patient management decisions. A negative result may occur with  improper specimen collection/handling, submission of specimen other than nasopharyngeal swab, presence of viral mutation(s) within the areas targeted by this assay, and inadequate number of viral copies(<138 copies/mL). A negative result must be combined with clinical observations, patient history, and epidemiological information. The expected result is Negative.  Fact Sheet for Patients:  EntrepreneurPulse.com.au  Fact Sheet for Healthcare Providers:  IncredibleEmployment.be  This test is no t yet approved or cleared by the [place] FDA and  has been authorized for detection and/or diagnosis of SARS-CoV-2 by FDA under an Emergency Use Authorization (EUA). This EUA will remain  in effect (meaning this test can be used) for the duration of the COVID-19 declaration under Section 564(b)(1) of the Act, 21 U.S.C.section 360bbb-3(b)(1), unless the authorization is terminated  or revoked sooner.       Influenza A by PCR NEGATIVE NEGATIVE   Influenza B by PCR NEGATIVE NEGATIVE    Comment: (NOTE) The Xpert Xpress SARS-CoV-2/FLU/RSV plus assay is  intended as an aid in the diagnosis of influenza from Nasopharyngeal swab specimens and should not be used as a sole basis for treatment. Nasal washings and aspirates are unacceptable for Xpert Xpress SARS-CoV-2/FLU/RSV testing.  Fact Sheet for Patients: EntrepreneurPulse.com.au  Fact Sheet for Healthcare Providers: IncredibleEmployment.be  This test is not yet approved or cleared by the [place] FDA and has been authorized for detection and/or diagnosis of SARS-CoV-2 by FDA under an Emergency Use Authorization (EUA). This EUA will remain in effect (meaning this test can be used) for the duration of the COVID-19 declaration under Section 564(b)(1) of the Act, 21 U.S.C. section 360bbb-3(b)(1), unless the authorization is terminated or revoked.  Performed at [facility], [place] [address], [place], [place] [address]     CT Abdomen Pelvis W Contrast  Result Date: 05/19/2021 CLINICAL DATA:  Abdominal pain with nausea and vomiting, possible small bowel obstruction EXAM: CT ABDOMEN AND PELVIS WITH CONTRAST TECHNIQUE: Multidetector CT imaging of the abdomen and pelvis was performed using the standard protocol following bolus administration of intravenous contrast. CONTRAST:  168mL OMNIPAQUE IOHEXOL 300 MG/ML  SOLN COMPARISON:  09/19/2017, plain film from earlier in the same day. FINDINGS: Lower chest: Mild bibasilar atelectasis right greater than left. No focal parenchymal nodule is seen. Hepatobiliary: Diffuse decreased attenuation is noted within the liver without focal mass likely related to fatty infiltration. Common bile duct stent is noted in place. Air is noted within the biliary tree as well as within the gallbladder related to the biliary stent. Multiple gallstones are seen. Perihepatic ascites is noted. Pancreas: Pancreatic stent is noted centrally. More peripheral pancreas is within normal limits. 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 Adrenals/Urinary Tract: Adrenal glands are within normal limits. Kidneys show no renal calculi or obstructive changes. Scattered cysts are noted bilaterally. Ureters are within normal limits. The bladder is partially distended. Stomach/Bowel: Colon shows no obstructive or inflammatory changes. The appendix is within normal limits without inflammatory change. Small bowel demonstrates diffuse small bowel dilatation with fecalization of bowel contents consistent with high-grade small bowel obstruction. There is a transition zone noted in the right mid abdomen best seen on image number 33 of series 6 and image number 42 of series 3. No discrete mass is noted in this region. Stomach is well distended with fluid. Gastric  catheter is noted within the distal esophagus and should be advanced several cm deeper into the stomach. Vascular/Lymphatic: Aortic atherosclerosis. No enlarged abdominal or pelvic lymph nodes. Reproductive: Prostate is unremarkable. Other: No discrete hernia is noted. Ascites is seen likely of a reactive nature related to the small-bowel obstruction. No findings to suggest perforation are noted at this time. Musculoskeletal: Patchy sclerotic changes are noted within the bony structures similar to that seen on prior exam. This is likely related to the patient's underlying known history of sickle cell. IMPRESSION: Changes consistent with a small-bowel obstruction in the mid ileum. This is high-grade in nature with fecalization of bowel contents. The transition zone shows no mass. These changes are likely related to adhesions or underlying scarring. Cholelithiasis with evidence of pneumobilia secondary to a biliary stent. Pancreatic stent is noted as well. Changes consistent with the known history of sickle cell disease. Gastric catheter is noted within the distal esophagus and should be advanced further into the stomach to allow for decompression. Electronically Signed   By: Inez Catalina  M.D.   On: 05/19/2021 20:18   DG Abd Acute W/Chest  Addendum Date: 05/19/2021   ADDENDUM REPORT: 05/19/2021 17:32 ADDENDUM: These results were called by telephone at the time of interpretation on 05/19/2021 at 5:32 pm to provider [facility] , who verbally acknowledged these results. Electronically Signed   By: Zetta Bills M.D.   On: 05/19/2021 17:32   Result Date: 05/19/2021 CLINICAL DATA:  Small-bowel obstruction suspected. EXAM: DG ABDOMEN ACUTE WITH 1 VIEW CHEST COMPARISON:  Comparison is made with preoperative chest x-ray February 14th of 2022. FINDINGS: Trachea midline. Cardiomediastinal contours and hilar structures with mild cardiomegaly and central pulmonary vascular engorgement. No signs of pneumothorax or consolidative changes. Juxta diaphragmatic atelectasis. No pneumothorax. No acute skeletal process about the bony thorax. Limited imaging due to patient position in stretcher due to patient condition by report. Gas beneath the LEFT hemidiaphragm, just below this are dilated loops of small bowel. This could reflect gas in the stomach move cephalad by the distended bowel loops. Biliary stent is in place to the RIGHT of L1, L2 and L3. Gallstone likely in the RIGHT upper quadrant. Pancreatic stent also in place. EKG leads project cross the patient's abdomen. Multiple air-fluid levels are present throughout the upper abdomen. Corresponding dilated loops of small bowel throughout the abdomen. There is some stool in the colon and a small amount of rectal stool and gas. On limited assessment there is no acute skeletal process. IMPRESSION: Signs of small bowel obstruction. Gas beneath the LEFT hemidiaphragm likely reflects gas in the stomach displaced cephalad by the distended bowel loops. Do not favor free air in this setting but would suggest CT for further evaluation in this patient with signs of bowel obstruction. Biliary and pancreatic stents. Gallstone in the RIGHT upper quadrant. A call is out to  the referring provider to further discuss findings in the above case. Electronically Signed: By: Zetta Bills M.D. On: 05/19/2021 17:25   DG Abd Portable 1 View  Result Date: 05/19/2021 CLINICAL DATA:  Nasogastric tube placement EXAM: PORTABLE ABDOMEN - 1 VIEW COMPARISON:  X-ray abdomen 05/19/2021 FINDINGS: Enteric tube noted coursing below the hemidiaphragm with tip overlying the gastric lumen and side port likely in the region of the distal esophagus/gastroesophageal junction. Query gaseous distension of small bowel within the left upper abdomen. IMPRESSION: 1. Enteric tube coursing below the hemidiaphragm with tip overlying the gastric lumen and side port likely in the region  of the distal esophagus/gastroesophageal junction. Recommend advancement by 5 cm. 2. Query gaseous distension of small bowel within the left upper abdomen. Electronically Signed   By: Iven Finn M.D.   On: 05/19/2021 19:48    ROS 10 point review of systems is negative except as listed above in HPI.   Physical Exam Blood pressure 111/77, pulse 89, temperature 98.9 F (37.2 C), resp. rate 19, height 5\' 11"  (1.803 m), weight 67.6 kg, SpO2 100 %. Constitutional: well-developed, well-nourished HEENT: blind, moist conjunctiva, external inspection of ears and nose normal, hearing intact Oropharynx: normal oropharyngeal mucosa, poor dentition Neck: no thyromegaly, trachea midline, no midline cervical tenderness to palpation Chest: breath sounds equal bilaterally, normal respiratory effort, no midline or lateral chest wall tenderness to palpation/deformity Abdomen: soft, NT, no bruising, no hepatosplenomegaly GU: no blood at urethral meatus of penis, no scrotal masses or abnormality  Back: no wounds, no thoracic/lumbar spine tenderness to palpation, no thoracic/lumbar spine stepoffs Rectal: deferred Extremities: 2+ radial and pedal pulses bilaterally, intact motor and sensation bilateral UE and LE, RUE AVF, no peripheral  edema MSK: normal gait/station, no clubbing/cyanosis of fingers/toes, normal ROM of all four extremities Skin: warm, dry, no rashes Psych: normal memory, normal mood/affect     Assessment/Plan: 78M with SBO  Recommend SB protocol and NGT for decompression. In light of no prior surgical history, no known/reported h/o diverticulitis/appendicitis, short interval recurrence, and recent choledocholithiasis s/p stenting, would recommend diagnostic laparoscopy with LOA and simultaneous laparoscopic cholecystectomy. Recommend performing this after decompression of SB, in the new few days. Discussed with patient, who is agreeable to surgery.    Jesusita Oka, MD General and [facility]

## 2021-05-20 ENCOUNTER — Inpatient Hospital Stay (HOSPITAL_COMMUNITY): Payer: HMO

## 2021-05-20 DIAGNOSIS — R1084 Generalized abdominal pain: Secondary | ICD-10-CM | POA: Diagnosis not present

## 2021-05-20 DIAGNOSIS — I5022 Chronic systolic (congestive) heart failure: Secondary | ICD-10-CM | POA: Diagnosis not present

## 2021-05-20 DIAGNOSIS — K56609 Unspecified intestinal obstruction, unspecified as to partial versus complete obstruction: Secondary | ICD-10-CM | POA: Diagnosis not present

## 2021-05-20 DIAGNOSIS — N186 End stage renal disease: Secondary | ICD-10-CM | POA: Diagnosis not present

## 2021-05-20 LAB — CBC
HCT: 20.6 % — ABNORMAL LOW (ref 39.0–52.0)
HCT: 18.8 % — ABNORMAL LOW (ref 39.0–52.0)
Hemoglobin: 7.6 g/dL — ABNORMAL LOW (ref 13.0–17.0)
Hemoglobin: 6.9 g/dL — CL (ref 13.0–17.0)
MCH: 39 pg — ABNORMAL HIGH (ref 26.0–34.0)
MCH: 38.8 pg — ABNORMAL HIGH (ref 26.0–34.0)
MCHC: 36.9 g/dL — ABNORMAL HIGH (ref 30.0–36.0)
MCHC: 36.7 g/dL — ABNORMAL HIGH (ref 30.0–36.0)
MCV: 105.6 fL — ABNORMAL HIGH (ref 80.0–100.0)
MCV: 105.6 fL — ABNORMAL HIGH (ref 80.0–100.0)
Platelets: 182 10*3/uL (ref 150–400)
Platelets: 153 10*3/uL (ref 150–400)
RBC: 1.95 MIL/uL — ABNORMAL LOW (ref 4.22–5.81)
RBC: 1.78 MIL/uL — ABNORMAL LOW (ref 4.22–5.81)
RDW: 18.4 % — ABNORMAL HIGH (ref 11.5–15.5)
RDW: 18.5 % — ABNORMAL HIGH (ref 11.5–15.5)
WBC: 8 10*3/uL (ref 4.0–10.5)
WBC: 6.2 10*3/uL (ref 4.0–10.5)
nRBC: 0.5 % — ABNORMAL HIGH (ref 0.0–0.2)
nRBC: 0.6 % — ABNORMAL HIGH (ref 0.0–0.2)

## 2021-05-20 LAB — COMPREHENSIVE METABOLIC PANEL
ALT: 18 U/L (ref 0–44)
AST: 34 U/L (ref 15–41)
Albumin: 2.4 g/dL — ABNORMAL LOW (ref 3.5–5.0)
Alkaline Phosphatase: 118 U/L (ref 38–126)
Anion gap: 13 (ref 5–15)
BUN: 51 mg/dL — ABNORMAL HIGH (ref 8–23)
CO2: 28 mmol/L (ref 22–32)
Calcium: 8.4 mg/dL — ABNORMAL LOW (ref 8.9–10.3)
Chloride: 91 mmol/L — ABNORMAL LOW (ref 98–111)
Creatinine, Ser: 5.77 mg/dL — ABNORMAL HIGH (ref 0.61–1.24)
GFR, Estimated: 10 mL/min — ABNORMAL LOW (ref >60)
Glucose, Bld: 108 mg/dL — ABNORMAL HIGH (ref 70–99)
Potassium: 5.4 mmol/L — ABNORMAL HIGH (ref 3.5–5.1)
Sodium: 132 mmol/L — ABNORMAL LOW (ref 135–145)
Total Bilirubin: 2.7 mg/dL — ABNORMAL HIGH (ref 0.3–1.2)
Total Protein: 5.6 g/dL — ABNORMAL LOW (ref 6.5–8.1)

## 2021-05-20 LAB — PROTIME-INR
INR: 1.5 — ABNORMAL HIGH (ref 0.8–1.2)
Prothrombin Time: 17.7 seconds — ABNORMAL HIGH (ref 11.4–15.2)

## 2021-05-20 LAB — PHOSPHORUS: Phosphorus: 6.3 mg/dL — ABNORMAL HIGH (ref 2.5–4.6)

## 2021-05-20 LAB — HEPATITIS B SURFACE ANTIGEN: Hepatitis B Surface Ag: NONREACTIVE

## 2021-05-20 LAB — MAGNESIUM: Magnesium: 2.1 mg/dL (ref 1.7–2.4)

## 2021-05-20 LAB — HEPATITIS B SURFACE ANTIBODY,QUALITATIVE: Hep B S Ab: REACTIVE — AB

## 2021-05-20 LAB — PREPARE RBC (CROSSMATCH)

## 2021-05-20 LAB — HIV ANTIBODY (ROUTINE TESTING W REFLEX): HIV Screen 4th Generation wRfx: NONREACTIVE

## 2021-05-20 MED ORDER — PANTOPRAZOLE SODIUM 40 MG IV SOLR
40.0000 mg | Freq: Two times a day (BID) | INTRAVENOUS | Status: DC
  Administered 2021-05-20 – 2021-05-28 (×16): 40 mg via INTRAVENOUS
  Filled 2021-05-20 (×17): qty 40

## 2021-05-20 MED ORDER — SODIUM CHLORIDE 0.9% IV SOLUTION: Freq: Once | INTRAVENOUS | Status: AC

## 2021-05-20 MED ORDER — MORPHINE SULFATE (PF) 2 MG/ML IV SOLN
2.0000 mg | Freq: Once | INTRAVENOUS | Status: AC
  Administered 2021-05-20: 2 mg via INTRAVENOUS
  Filled 2021-05-20: qty 1

## 2021-05-20 MED ORDER — PENTAFLUOROPROP-TETRAFLUOROETH EX AERO: 1.0000 "application " | INHALATION_SPRAY | CUTANEOUS | Status: DC | PRN

## 2021-05-20 MED ORDER — LIDOCAINE-PRILOCAINE 2.5-2.5 % EX CREA: 1.0000 "application " | TOPICAL_CREAM | CUTANEOUS | Status: DC | PRN

## 2021-05-20 MED ORDER — CHLORHEXIDINE GLUCONATE CLOTH 2 % EX PADS: 6.0000 | MEDICATED_PAD | Freq: Every day | CUTANEOUS | Status: DC

## 2021-05-20 MED ORDER — LIDOCAINE HCL (PF) 1 % IJ SOLN: 5.0000 mL | INTRAMUSCULAR | Status: DC | PRN

## 2021-05-20 MED ORDER — DARBEPOETIN ALFA 200 MCG/0.4ML IJ SOSY: 200.0000 ug | PREFILLED_SYRINGE | INTRAMUSCULAR | Status: DC

## 2021-05-20 MED ORDER — ALTEPLASE 2 MG IJ SOLR
2.0000 mg | Freq: Once | INTRAMUSCULAR | Status: DC | PRN
  Filled 2021-05-20: qty 2

## 2021-05-20 MED ORDER — SODIUM CHLORIDE 0.9 % IV SOLN: 100.0000 mL | INTRAVENOUS | Status: DC | PRN

## 2021-05-20 NOTE — Procedures (Signed)
   I was present at this dialysis session, have reviewed the session itself and made  appropriate changes Kelly Splinter MD [place] pager [phone]   05/20/2021, 1:55 PM

## 2021-05-20 NOTE — Progress Notes (Signed)
PROGRESS NOTE    Patrick [name]  YQM:[id number] DOB: 09-07-1955 DOA: 05/19/2021 PCP: Dorena Dew, FNP   Brief Narrative:  The patient is a 65 year old African-American male with a past medical history significant for but not limited to ESRD on dialysis Tuesday, Thursdays, Saturday, anemia of chronic kidney disease as well as history of sickle cell with baseline hemoglobin of 5-28, chronic systolic CHF, chronic atrial fibrillation with anticoagulant Eliquis as well as other comorbidities who presented with small bowel obstruction after presenting from his home to the ED complaining of abdominal pain.  He was recently hospitalized at [place] from 05/09/2025 for small bowel obstruction and subsequently resolved with conservative measures in addition to management of choledocholithiasis via ERCP stone removal and stents to the common bile duct as well as the pancreatic duct.  During his previous hospitalization general surgery reportedly recommended cholecystectomy but the patient ultimately refused this measure at that time.  His additional hospital course was notable for acute on chronic anemia with a hemoglobin on the day of admission of 5.7 requiring PRBCs as an swelling hemoglobin improved to follow value of 7.9 at the time of discharge.  Over last few days since being discharged patient has had worsening generalized abdominal discomfort and it is initially intermittent but then became constant and was worse with palpation or attempts to eat or drink anything with that timeframe.  He also notes having new nausea and vomiting last few days is nonbloody and nonbilious emesis along that timeframe with most recent episodes occurring in the ED.  He also noted a decline in his flatus and has had a bowel movement 2 to 3 days.  Normally he takes as needed oxycodone as an outpatient.  He presented the ED and was admitted for small bowel obstruction and a NG tube placed in the ED is attached to low  intermittent wall suction.  In the ED he received fentanyl, Zofran and a normal saline bolus.  Because he has a AICD patient nephrology was consulted for maintenance of dialysis and he underwent dialysis today.  Patient's blood count has been steadily dropping and he had a very large amount of dark bilious drainage from his NG with possible blood in it.  Given his decline in hemoglobin he was placed on a PPI twice daily and GI was called for further evaluation and we will obtain a gastric occult on the contents from his NG.  Assessment & Plan:   Principal Problem:   SBO (small bowel obstruction) (HCC) Active Problems:   Abdominal pain   Nausea & vomiting   Anemia   End stage renal disease (HCC)   Permanent atrial fibrillation (HCC)   Chronic systolic CHF (congestive heart failure) (HCC)    Small bowel obstruction -His worsening abdominal pain in the last 3 to 4 days associated with nausea, vomiting, decreased flatus production as well as imaging showing high-grade small bowel obstruction in the mid ileum with transition zone and no bowel abscess or perforation. -Recently hospitalized in [place] for small bowel obstruction that resolved with conservative -General surgery consulted for further evaluation and recommending continue NG tube in place and testing to low intermittent wall suction -Pain control -Early mobilization and sure electrolytes are within normal limits.  We will ensure potassium greater than 4 and if magnesium level greater than 2 -Continue supportive care and antiemetics as patient is strict n.p.o. -General surgery consulted and recommending a small bowel protocol and NG tube for decompression; patient has no history of  reported surgeries but general surgery is recommending a diagnostic laparoscopically with lysis of adhesions and simultaneous laparoscopic cholecystectomy and performing this in the next few days  End stage renal disease on hemodialysis Tuesday Thursday  Saturday Hyponatremia Hyperphosphatemia -Patient underwent dialysis today and nephrology been consulted -He did have hyper kalemia but this was able to be corrected in dialysis -Strict I's and O's and daily weights -Continue to monitor renal function carefully and BUN/creatinine went from 40/5.55 and trended up to 51/5.77 -Further care per nephrology  Acute on chronic anemia in setting of sickle cell anemia and chronic kidney disease -Patient is a baseline hemoglobin/hematocrit of 8-10 -Presented with hemoglobin of 8.7 however has been slowly trending down given his dark bilious fluid question of whether there is blood in -Empirically start PPI twice daily and consulted GI for further recommendations and will obtain gastric occult -Given that his hemoglobin dropped to 6.9 in dialysis we will type and screen and transfuse 1 unit PBC -Continue to monitor for signs and symptoms of bleeding; may need more blood transfusion given his sickle cell anemia -Continue to monitor and trend CBC and check INR in the a.m.  Chronic systolic CHF -Echo done in the last year showed EF of 40 to 45% with indeterminate diastolic parameters -Patient has volume managed with dialysis -Hold his home antihypertensives given his n.p.o. status and conservative management of SBO -Continue monitor and trend  Chronic atrial fibrillation -CHA2DS2-VASc score is 3 -He is chronically anticoagulated with Eliquis and on carvedilol for beta-blockade -Had an echocardiogram last year that showed severely dilated bilateral atria as well as moderate to severe mitral regurgitation -Presented with rate controlled A. fib without evidence of acute ischemic changes -Currently hold his apixaban if he may go to surgery this week and may consider heparin drip but will hold for now given that his blood count is dropping  COPD -Patient is a former smoker and quit in 2007 -Continue with his ministry and albuterol inhaler -Continue to  monitor for respiratory compromise  Hyperbilirubinemia -In the setting of sickle cell anemia and hemolysis -Patient's T bili trended up slightly and went from 2.5 and now 2.7 -Get dialyzed today and will be getting IV unit of PRBCs -Continue to monitor and trend and repeat CMP in a.m.  DVT prophylaxis: SCDs and will hold his anticoagulation given he may go for surgical intervention Code Status: FULL CODE  Family Communication: Discussed with family at bedside Disposition Plan: Pending further clinical improvement and improvement of his small bowel obstruction and clearance by specialists  Status is: Inpatient  Remains inpatient appropriate because: Remains n.p.o. and still has no bowel movements and will likely be going for surgical intervention  Consultants:  General Surgery Nephrology Gastroenterology  Procedures: NG tube placement  Antimicrobials:  Anti-infectives (From admission, onward)    None        Subjective: Seen and examined at bedside and patient was feeling okay but still having some abdominal pain and had a lot of bilious drainage from his NG.  States that he feels a little bit nauseous still and still has some abdominal pain.  No chest pain or shortness of breath.  No other concerns or complaints this time.  Objective: Vitals:   05/20/21 1600 05/20/21 1630 05/20/21 1700 05/20/21 1730  BP: (!) 87/63 (!) 98/53 97/62 105/60  Pulse:    (!) 114  Resp: 17 (!) 21 18 14   Temp:   (!) 96.8 F (36 C) 97.6 F (36.4 C)  TempSrc:  Temporal Temporal  SpO2:   98% 100%  Weight:    67.3 kg  Height:        Intake/Output Summary (Last 24 hours) at 05/20/2021 2002 Last data filed at 05/20/2021 1900 Gross per 24 hour  Intake 820.83 ml  Output 1550 ml  Net -729.17 ml   Filed Weights   05/20/21 0424 05/20/21 1350 05/20/21 1730  Weight: 68.5 kg 67.2 kg 67.3 kg   Examination: Physical Exam:  Constitutional: Thin chronically ill-appearing African-American male  currently in mild distress appears a little uncomfortable Eyes:  Lids and conjunctivae normal, sclerae anicteric  ENMT: External Ears, Nose appear normal.  NG tube is in place.  Grossly normal hearing Neck: Appears normal, supple, no cervical masses, normal ROM, no appreciable thyromegaly; no appreciable JVD Respiratory: Diminished to auscultation bilaterally, no wheezing, rales, rhonchi or crackles. Normal respiratory effort and patient is not tachypenic. No accessory muscle use.  Unlabored breathing Cardiovascular: RRR, no murmurs / rubs / gallops. S1 and S2 auscultated.  Slight lower extremity edema Abdomen: Soft, somewhat-tender, distended.  Diminished bowel sounds GU: Deferred. Musculoskeletal: No clubbing / cyanosis of digits/nails. No joint deformity upper and lower extremities.  Skin: No rashes, lesions, ulcers on limited skin evaluation. No induration; Warm and dry.  Neurologic: CN 2-12 grossly intact with no focal deficits. Romberg sign and cerebellar reflexes not assessed.  Psychiatric: Normal judgment and insight. Alert and oriented x 3. Normal mood and appropriate affect.   Data Reviewed: I have personally reviewed following labs and imaging studies  CBC: Recent Labs  Lab 05/19/21 1445 05/20/21 0249 05/20/21 1420  WBC 8.3 8.0 6.2  HGB 8.7* 7.6* 6.9*  HCT 24.5* 20.6* 18.8*  MCV 108.9* 105.6* 105.6*  PLT 215 182 664   Basic Metabolic Panel: Recent Labs  Lab 05/19/21 1445 05/20/21 0249  NA 133* 132*  K 5.0 5.4*  CL 91* 91*  CO2 29 28  GLUCOSE 108* 108*  BUN 40* 51*  CREATININE 5.55* 5.77*  CALCIUM 8.7* 8.4*  MG  --  2.1  PHOS  --  6.3*   GFR: Estimated Creatinine Clearance: 12.5 mL/min (A) (by C-G formula based on SCr of 5.77 mg/dL (H)). Liver Function Tests: Recent Labs  Lab 05/19/21 1445 05/20/21 0249  AST 40 34  ALT 18 18  ALKPHOS 118 118  BILITOT 2.5* 2.7*  PROT 6.5 5.6*  ALBUMIN 2.8* 2.4*   Recent Labs  Lab 05/19/21 1445  LIPASE 51   No  results for input(s): AMMONIA in the last 168 hours. Coagulation Profile: Recent Labs  Lab 05/20/21 0249  INR 1.5*   Cardiac Enzymes: No results for input(s): CKTOTAL, CKMB, CKMBINDEX, TROPONINI in the last 168 hours. BNP (last 3 results) No results for input(s): PROBNP in the last 8760 hours. HbA1C: No results for input(s): HGBA1C in the last 72 hours. CBG: No results for input(s): GLUCAP in the last 168 hours. Lipid Profile: No results for input(s): CHOL, HDL, LDLCALC, TRIG, CHOLHDL, LDLDIRECT in the last 72 hours. Thyroid Function Tests: No results for input(s): TSH, T4TOTAL, FREET4, T3FREE, THYROIDAB in the last 72 hours. Anemia Panel: No results for input(s): VITAMINB12, FOLATE, FERRITIN, TIBC, IRON, RETICCTPCT in the last 72 hours. Sepsis Labs: No results for input(s): PROCALCITON, LATICACIDVEN in the last 168 hours.  Recent Results (from the past 240 hour(s))  Resp Panel by RT-PCR (Flu A&B, Covid) Nasopharyngeal Swab     Status: None   Collection Time: 05/19/21  6:29 PM   Specimen: Nasopharyngeal Swab;  Nasopharyngeal(NP) swabs in vial transport medium  Result Value Ref Range Status   SARS Coronavirus 2 by RT PCR NEGATIVE NEGATIVE Final    Comment: (NOTE) SARS-CoV-2 target nucleic acids are NOT DETECTED.  The SARS-CoV-2 RNA is generally detectable in upper respiratory specimens during the acute phase of infection. The lowest concentration of SARS-CoV-2 viral copies this assay can detect is 138 copies/mL. A negative result does not preclude SARS-Cov-2 infection and should not be used as the sole basis for treatment or other patient management decisions. A negative result may occur with  improper specimen collection/handling, submission of specimen other than nasopharyngeal swab, presence of viral mutation(s) within the areas targeted by this assay, and inadequate number of viral copies(<138 copies/mL). A negative result must be combined with clinical observations,  patient history, and epidemiological information. The expected result is Negative.  Fact Sheet for Patients:  EntrepreneurPulse.com.au  Fact Sheet for Healthcare Providers:  IncredibleEmployment.be  This test is no t yet approved or cleared by the [place] FDA and  has been authorized for detection and/or diagnosis of SARS-CoV-2 by FDA under an Emergency Use Authorization (EUA). This EUA will remain  in effect (meaning this test can be used) for the duration of the COVID-19 declaration under Section 564(b)(1) of the Act, 21 U.S.C.section 360bbb-3(b)(1), unless the authorization is terminated  or revoked sooner.       Influenza A by PCR NEGATIVE NEGATIVE Final   Influenza B by PCR NEGATIVE NEGATIVE Final    Comment: (NOTE) The Xpert Xpress SARS-CoV-2/FLU/RSV plus assay is intended as an aid in the diagnosis of influenza from Nasopharyngeal swab specimens and should not be used as a sole basis for treatment. Nasal washings and aspirates are unacceptable for Xpert Xpress SARS-CoV-2/FLU/RSV testing.  Fact Sheet for Patients: EntrepreneurPulse.com.au  Fact Sheet for Healthcare Providers: IncredibleEmployment.be  This test is not yet approved or cleared by the [place] FDA and has been authorized for detection and/or diagnosis of SARS-CoV-2 by FDA under an Emergency Use Authorization (EUA). This EUA will remain in effect (meaning this test can be used) for the duration of the COVID-19 declaration under Section 564(b)(1) of the Act, 21 U.S.C. section 360bbb-3(b)(1), unless the authorization is terminated or revoked.  Performed at [facility], [place] [address] [place], [place] [address]     RN Pressure Injury Documentation:     Estimated body mass index is 20.69 kg/m as calculated from the following:   Height as of this encounter: 5\' 11"  (1.803 m).   Weight as of this encounter: 67.3  kg.  Malnutrition Type:   Malnutrition Characteristics:   Nutrition Interventions:   Radiology Studies: CT Abdomen Pelvis W Contrast  Result Date: 05/19/2021 CLINICAL DATA:  Abdominal pain with nausea and vomiting, possible small bowel obstruction EXAM: CT ABDOMEN AND PELVIS WITH CONTRAST TECHNIQUE: Multidetector CT imaging of the abdomen and pelvis was performed using the standard protocol following bolus administration of intravenous contrast. CONTRAST:  166mL OMNIPAQUE IOHEXOL 300 MG/ML  SOLN COMPARISON:  09/19/2017, plain film from earlier in the same day. FINDINGS: Lower chest: Mild bibasilar atelectasis right greater than left. No focal parenchymal nodule is seen. Hepatobiliary: Diffuse decreased attenuation is noted within the liver without focal mass likely related to fatty infiltration. Common bile duct stent is noted in place. Air is noted within the biliary tree as well as within the gallbladder related to the biliary stent. Multiple gallstones are seen. Perihepatic ascites is noted. Pancreas: Pancreatic stent is noted centrally. More  peripheral pancreas is within normal limits. 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 Adrenals/Urinary Tract: Adrenal glands are within normal limits. Kidneys show no renal calculi or obstructive changes. Scattered cysts are noted bilaterally. Ureters are within normal limits. The bladder is partially distended. Stomach/Bowel: Colon shows no obstructive or inflammatory changes. The appendix is within normal limits without inflammatory change. Small bowel demonstrates diffuse small bowel dilatation with fecalization of bowel contents consistent with high-grade small bowel obstruction. There is a transition zone noted in the right mid abdomen best seen on image number 33 of series 6 and image number 42 of series 3. No discrete mass is noted in this region. Stomach is well distended with fluid. Gastric catheter is noted within the distal  esophagus and should be advanced several cm deeper into the stomach. Vascular/Lymphatic: Aortic atherosclerosis. No enlarged abdominal or pelvic lymph nodes. Reproductive: Prostate is unremarkable. Other: No discrete hernia is noted. Ascites is seen likely of a reactive nature related to the small-bowel obstruction. No findings to suggest perforation are noted at this time. Musculoskeletal: Patchy sclerotic changes are noted within the bony structures similar to that seen on prior exam. This is likely related to the patient's underlying known history of sickle cell. IMPRESSION: Changes consistent with a small-bowel obstruction in the mid ileum. This is high-grade in nature with fecalization of bowel contents. The transition zone shows no mass. These changes are likely related to adhesions or underlying scarring. Cholelithiasis with evidence of pneumobilia secondary to a biliary stent. Pancreatic stent is noted as well. Changes consistent with the known history of sickle cell disease. Gastric catheter is noted within the distal esophagus and should be advanced further into the stomach to allow for decompression. Electronically Signed   By: Inez Catalina M.D.   On: 05/19/2021 20:18   DG Abd Acute W/Chest  Addendum Date: 05/19/2021   ADDENDUM REPORT: 05/19/2021 17:32 ADDENDUM: These results were called by telephone at the time of interpretation on 05/19/2021 at 5:32 pm to provider [facility] , who verbally acknowledged these results. Electronically Signed   By: Zetta Bills M.D.   On: 05/19/2021 17:32   Result Date: 05/19/2021 CLINICAL DATA:  Small-bowel obstruction suspected. EXAM: DG ABDOMEN ACUTE WITH 1 VIEW CHEST COMPARISON:  Comparison is made with preoperative chest x-ray February 14th of 2022. FINDINGS: Trachea midline. Cardiomediastinal contours and hilar structures with mild cardiomegaly and central pulmonary vascular engorgement. No signs of pneumothorax or consolidative changes. Juxta diaphragmatic  atelectasis. No pneumothorax. No acute skeletal process about the bony thorax. Limited imaging due to patient position in stretcher due to patient condition by report. Gas beneath the LEFT hemidiaphragm, just below this are dilated loops of small bowel. This could reflect gas in the stomach move cephalad by the distended bowel loops. Biliary stent is in place to the RIGHT of L1, L2 and L3. Gallstone likely in the RIGHT upper quadrant. Pancreatic stent also in place. EKG leads project cross the patient's abdomen. Multiple air-fluid levels are present throughout the upper abdomen. Corresponding dilated loops of small bowel throughout the abdomen. There is some stool in the colon and a small amount of rectal stool and gas. On limited assessment there is no acute skeletal process. IMPRESSION: Signs of small bowel obstruction. Gas beneath the LEFT hemidiaphragm likely reflects gas in the stomach displaced cephalad by the distended bowel loops. Do not favor free air in this setting but would suggest CT for further evaluation in this patient with signs of bowel obstruction.  Biliary and pancreatic stents. Gallstone in the RIGHT upper quadrant. A call is out to the referring provider to further discuss findings in the above case. Electronically Signed: By: Zetta Bills M.D. On: 05/19/2021 17:25   DG Abd Portable 1V-Small Bowel Obstruction Protocol-initial, 8 hr delay  Result Date: 05/20/2021 CLINICAL DATA:  Small-bowel obstruction; 8 hour delay EXAM: PORTABLE ABDOMEN - 1 VIEW COMPARISON:  05/19/2021 FINDINGS: Enteric tube is within the stomach. There is residual enteric contrast. Probable persistent small bowel obstruction is not well evaluated on this supine study. Contrast is also present within the bladder. Calcified gallstone and common bile duct stent. Probable additional small stent within the main pancreatic duct. IMPRESSION: Probable persistent small bowel obstruction is not well evaluated on this supine study.  There is residual enteric contrast. Electronically Signed   By: Macy Mis M.D.   On: 05/20/2021 10:15   DG Abd Portable 1V-Small Bowel Protocol-Position Verification  Result Date: 05/19/2021 CLINICAL DATA:  Encounter for imaging study to confirm nasogastric tube placement. EXAM: PORTABLE ABDOMEN - 1 VIEW COMPARISON:  Xr abdomen 05/19/21, CT abdomen pelvis 05/19/2021 FINDINGS: Interval advancement of an enteric tube coursing below the hemidiaphragm with tip and side port overlying the expected region the gastric lumen. Side port likely just distal to the gastroesophageal junction. A 1.5 cm calcified density overlying the right upper quadrant consistent with gallstone. The common bile duct stent is noted overlying the right mid abdomen. Known pancreatic duct not well visualized. Cardiomegaly.  Aortic calcification. IMPRESSION: Interval advancement of an enteric tube coursing below the hemidiaphragm with tip and side port overlying the expected region the gastric lumen. Electronically Signed   By: Iven Finn M.D.   On: 05/19/2021 23:57   DG Abd Portable 1 View  Result Date: 05/19/2021 CLINICAL DATA:  Nasogastric tube placement EXAM: PORTABLE ABDOMEN - 1 VIEW COMPARISON:  X-ray abdomen 05/19/2021 FINDINGS: Enteric tube noted coursing below the hemidiaphragm with tip overlying the gastric lumen and side port likely in the region of the distal esophagus/gastroesophageal junction. Query gaseous distension of small bowel within the left upper abdomen. IMPRESSION: 1. Enteric tube coursing below the hemidiaphragm with tip overlying the gastric lumen and side port likely in the region of the distal esophagus/gastroesophageal junction. Recommend advancement by 5 cm. 2. Query gaseous distension of small bowel within the left upper abdomen. Electronically Signed   By: Iven Finn M.D.   On: 05/19/2021 19:48    Scheduled Meds:  Chlorhexidine Gluconate Cloth  6 each Topical Q0600   [START ON 05/27/2021]  darbepoetin (ARANESP) injection - DIALYSIS  200 mcg Intravenous Q Sat-HD   fluticasone furoate-vilanterol  1 puff Inhalation Daily   And   umeclidinium bromide  1 puff Inhalation Daily   pantoprazole (PROTONIX) IV  40 mg Intravenous Q12H   Continuous Infusions:   LOS: 1 day   Kerney Elbe, DO [facility] PAGER is on AMION  If 7PM-7AM, please contact night-coverage [email or web address]

## 2021-05-20 NOTE — Consult Note (Signed)
[facility] Renal Consultation Note    Indication for Consultation:  Management of ESRD/hemodialysis, anemia, hypertension/volume, and secondary hyperparathyroidism. PCP:  HPI: Patrick [name] is a 65 y.o. male with ESRD, sickle cell disease, atrial fibrillation (on Eliquis), gout who was admitted with abdominal pain and small bowel obstruction.  Presented to ED on 12/2 with abdominal pain and weakness, as well as N/V for the past week. Recent admit 11/22-11/26/22 at [facility] with choledocholithiasis with CBD/pancreatic duct stent placement. Per notes, was recommended to have cholecystectomy at the time, but he refused. Hospitalization complicated by acute anemia and self-limited SBO, both which resolved by discharge. Labs in ED on 12/2 showed K 5, BUN 40, Alb 2.8, WBC 8.3, Hgb 8.7, Plts 215. Chest/abd xray with gall stone and bowel gas present. Abd CT with high-grade SBO. Surgery consulted -> NG tube placed, with plan for diagnostic laparoscopy + cholecystectomy in the near future.  Seen in room this morning, NG tube is putting out a lot of dark fluid, unclear if bile only v. bile + blood, primary MD informed to eval. Hgb today down to 7.6. Denies CP or dyspnea.  Dialyzes on TTS schedule at [facility] - last HD was Thursday. Uses AVF as access.   Past Medical History:  Diagnosis Date   Blood dyscrasia    Blood transfusion    "I've had a bunch of them"   CHF (congestive heart failure) ([place])    followed by hf clinic   Chronic kidney disease (CKD), stage III (moderate) ([place])    Dialysis patient ([place]) 04/2019   AV fistula (right arm)   Gout    Headache(784.0)    "probably weekly" (01/13/2014)   Legally blind    Shortness of breath dyspnea    Sickle cell disease, type SS (HCC)    Swelling abdomen    Swelling of extremity, left    Swelling of extremity, right    Past Surgical History:  Procedure Laterality Date   A/V FISTULAGRAM Right 07/29/2020   Procedure: A/V FISTULAGRAM  - Right Arm;  Surgeon: Angelia Mould, MD;  Location: [place] [facility];  Service: Cardiovascular;  Laterality: Right;   BASCILIC VEIN TRANSPOSITION Right 04/12/2014   Procedure: BASILIC VEIN TRANSPOSITION;  Surgeon: Elam Dutch, MD;  Location: [place];  Service: Vascular;  Laterality: Right;   BIOPSY  08/04/2020   Procedure: BIOPSY;  Surgeon: Ladene Artist, MD;  Location: [facility] ENDOSCOPY;  Service: Gastroenterology;;   CARDIAC CATHETERIZATION  05/2011   CARDIOVERSION N/A 06/05/2018   Procedure: CARDIOVERSION;  Surgeon: Jolaine Artist, MD;  Location: [place];  Service: Cardiovascular;  Laterality: N/A;   CARDIOVERSION N/A 10/06/2019   Procedure: CARDIOVERSION;  Surgeon: Jolaine Artist, MD;  Location: [place];  Service: Cardiovascular;  Laterality: N/A;   COLONOSCOPY WITH PROPOFOL N/A 09/24/2017   Procedure: COLONOSCOPY WITH PROPOFOL;  Surgeon: Jackquline Denmark, MD;  Location: [place];  Service: Endoscopy;  Laterality: N/A;   ESOPHAGOGASTRODUODENOSCOPY N/A 09/19/2017   Procedure: ESOPHAGOGASTRODUODENOSCOPY (EGD);  Surgeon: Jackquline Denmark, MD;  Location: [facility] ENDOSCOPY;  Service: Endoscopy;  Laterality: N/A;   ESOPHAGOGASTRODUODENOSCOPY (EGD) WITH PROPOFOL N/A 08/04/2020   Procedure: ESOPHAGOGASTRODUODENOSCOPY (EGD) WITH PROPOFOL;  Surgeon: Ladene Artist, MD;  Location: [place];  Service: Gastroenterology;  Laterality: N/A;   LEFT AND RIGHT HEART CATHETERIZATION WITH CORONARY ANGIOGRAM N/A 06/11/2011   Procedure: LEFT AND RIGHT HEART CATHETERIZATION WITH CORONARY ANGIOGRAM;  Surgeon: Larey Dresser, MD;  Location: [facility] CATH LAB;  Service: Cardiovascular;  Laterality: N/A;   PERIPHERAL VASCULAR BALLOON ANGIOPLASTY Right 07/29/2020   Procedure: PERIPHERAL VASCULAR BALLOON ANGIOPLASTY;  Surgeon: Angelia Mould, MD;  Location: [place] [facility];  Service: Cardiovascular;  Laterality: Right;  arm fistula   REVISON OF ARTERIOVENOUS FISTULA Right 08/08/2020    Procedure: ANEURYSM EXCISION OF RIGHT UPPER EXTREMITY ARTERIOVENOUS FISTULA;  Surgeon: Angelia Mould, MD;  Location: [place];  Service: Vascular;  Laterality: Right;   TEE WITHOUT CARDIOVERSION N/A 12/27/2015   Procedure: TRANSESOPHAGEAL ECHOCARDIOGRAM (TEE);  Surgeon: Sueanne Margarita, MD;  Location: [facility] ENDOSCOPY;  Service: Cardiovascular;  Laterality: N/A;   Family History  Problem Relation Age of Onset   Alcohol abuse Mother    Liver disease Mother    Cirrhosis Mother    Heart attack Mother    Social History:  reports that he quit smoking about 5 years ago. His smoking use included cigarettes. He has a 10.25 pack-year smoking history. He has never used smokeless tobacco. He reports that he does not currently use alcohol after a past usage of about 3.0 standard drinks per week. He reports that he does not use drugs.  ROS: As per HPI otherwise negative.  Physical Exam: Vitals:   05/19/21 2215 05/19/21 2303 05/20/21 0424 05/20/21 1136  BP: 111/77 107/71 103/63 102/66  Pulse: 89 99 92 99  Resp: 19 14 17 10   Temp:  97.8 F (36.6 C) 97.8 F (36.6 C) 97.8 F (36.6 C)  TempSrc:  Oral Oral Oral  SpO2: 100% 100% 98% 97%  Weight:  67.9 kg 68.5 kg   Height:         General: Ill appearing man, NAD. NG tube in place to suction. Head: Normocephalic, atraumatic, sclera non-icteric, mucus membranes are moist. Neck: Supple without lymphadenopathy/masses. JVD not elevated. Lungs: Clear bilaterally to auscultation without wheezes, rales, or rhonchi. Breathing is unlabored. Heart: Irregularly irregular, no murmur Abdomen: Soft, mildly distended and tender Musculoskeletal:  Strength and tone appear normal for age. Lower extremities: No edema or ischemic changes, no open wounds. Neuro: Alert and oriented X 3. Moves all extremities spontaneously. Psych:  Responds to questions appropriately with a normal affect. Dialysis Access: AVF + bruit  No Known Allergies Prior to Admission medications    Medication Sig Start Date End Date Taking? Authorizing Provider  acetaminophen (TYLENOL) 500 MG tablet Take 1,000 mg by mouth every 6 (six) hours as needed for moderate pain or headache.   Yes [provider]  allopurinol (ZYLOPRIM) 100 MG tablet TAKE 1 TABLET BY MOUTH EVERY DAY 05/17/21  Yes Dorena Dew, FNP  AURYXIA 1 GM 210 MG(Fe) tablet Take 420 mg by mouth 3 (three) times daily. 02/11/21  Yes [provider]  diphenhydramine-acetaminophen (TYLENOL PM) 25-500 MG TABS tablet Take 2 tablets by mouth at bedtime as needed (sleep).   Yes [provider]  ELIQUIS 5 MG TABS tablet TAKE 1 TABLET BY MOUTH TWICE A DAY 02/21/21  Yes Bensimhon, Shaune Pascal, MD  folic acid (FOLVITE) 1 MG tablet TAKE 1 TABLET BY MOUTH EVERY DAY 02/22/21  Yes Dorena Dew, FNP  gabapentin (NEURONTIN) 100 MG capsule TAKE 1 CAPSULE BY MOUTH EVERYDAY AT BEDTIME 12/12/20  Yes Dorena Dew, FNP  hydrALAZINE (APRESOLINE) 25 MG tablet Take 1 tablet (25 mg total) by mouth in the morning and at bedtime. DO NOT TAKE AM DOSES ON DIALYSIS DAYS Patient taking differently: Take 25 mg by mouth See admin instructions. Take 1 tablet by mouth twice daily all days,  EXCEPT on dialysis day (T, Th, Sat) take in the evening only 01/27/20 05/19/21 Yes Bensimhon, Shaune Pascal, MD  hydroxyurea (HYDREA) 500 MG capsule Take 1 capsule (500 mg total) by mouth daily. TAKE 1 CAPSULE BY MOUTH EVERY DAY WITH FOOD TO MINIMIZE GI SIDE EFFECTS 01/31/21  Yes Dorena Dew, FNP  isosorbide mononitrate (IMDUR) 30 MG 24 hr tablet Take 1 tablet (30 mg total) by mouth daily. DO NOT TAKE AM ON DIALYSIS DAYS. 08/25/20  Yes Bensimhon, Shaune Pascal, MD  lidocaine-prilocaine (EMLA) cream Apply 1 application topically daily as needed (port access).  09/08/19  Yes [provider]  metolazone (ZAROXOLYN) 5 MG tablet Take 1 tablet (5 mg total) by mouth daily as needed. Patient taking differently: Take 5 mg by mouth daily as needed (fluid).  05/23/20  Yes Bensimhon, Shaune Pascal, MD  Oxycodone HCl 10 MG TABS Take 1 tablet (10 mg total) by mouth every 4 (four) hours as needed (pain). 05/17/21  Yes Dorena Dew, FNP  tiZANidine (ZANAFLEX) 4 MG tablet TAKE 0.5 TABLET BY MOUTH EVERY 8 HOURS AS NEEDED FOR MUSCLE SPASMS Patient taking differently: Take 2 mg by mouth every 8 (eight) hours as needed for muscle spasms. 02/22/21  Yes Dorena Dew, FNP  torsemide (DEMADEX) 20 MG tablet Take 80 mg by mouth daily. 03/31/20  Yes [provider]  Vitamin D, Ergocalciferol, (DRISDOL) 1.25 MG (50000 UNIT) CAPS capsule TAKE 1 CAPSULE BY MOUTH ONE TIME PER WEEK Patient taking differently: Take 50,000 Units by mouth every Saturday. 05/15/21  Yes Dorena Dew, FNP  albuterol (VENTOLIN HFA) 108 (90 Base) MCG/ACT inhaler Inhale 2 puffs into the lungs every 6 (six) hours as needed for wheezing or shortness of breath. Patient not taking: Reported on 05/19/2021 05/09/20   Freddi Starr, MD  Budeson-Glycopyrrol-Formoterol (BREZTRI AEROSPHERE) 160-9-4.8 MCG/ACT AERO Inhale 2 puffs into the lungs in the morning and at bedtime. Patient not taking: Reported on 05/19/2021 12/21/20   Freddi Starr, MD  carvedilol (COREG) 12.5 MG tablet Take 1 tablet (12.5 mg total) by mouth 2 (two) times daily with a meal. Patient not taking: Reported on 05/19/2021 05/16/21   Bensimhon, Shaune Pascal, MD   Current Facility-Administered Medications  Medication Dose Route Frequency Provider Last Rate Last Admin   acetaminophen (TYLENOL) tablet 650 mg  650 mg Oral Q6H PRN Howerter, Justin B, DO       Or   acetaminophen (TYLENOL) suppository 650 mg  650 mg Rectal Q6H PRN Howerter, Justin B, DO       albuterol (PROVENTIL) (2.5 MG/3ML) 0.083% nebulizer solution 3 mL  3 mL Inhalation Q6H PRN Howerter, Justin B, DO       Chlorhexidine Gluconate Cloth 2 % PADS 6 each  6 each Topical Q0600 Katrinna Travieso R, PA-C       fluticasone furoate-vilanterol (BREO ELLIPTA) 100-25  MCG/ACT 1 puff  1 puff Inhalation Daily Howerter, Justin B, DO       And   umeclidinium bromide (INCRUSE ELLIPTA) 62.5 MCG/ACT 1 puff  1 puff Inhalation Daily Howerter, Justin B, DO       morphine 4 MG/ML injection 3 mg  3 mg Intravenous Q2H PRN Howerter, Justin B, DO   3 mg at 05/20/21 0956   naloxone (NARCAN) injection 0.4 mg  0.4 mg Intravenous PRN Howerter, Justin B, DO       ondansetron (ZOFRAN) injection 4 mg  4 mg Intravenous Q6H PRN Howerter, Justin B, DO   4 mg  at 05/20/21 0956   Labs: Basic Metabolic Panel: Recent Labs  Lab 05/19/21 1445 05/20/21 0249  NA 133* 132*  K 5.0 5.4*  CL 91* 91*  CO2 29 28  GLUCOSE 108* 108*  BUN 40* 51*  CREATININE 5.55* 5.77*  CALCIUM 8.7* 8.4*  PHOS  --  6.3*   Liver Function Tests: Recent Labs  Lab 05/19/21 1445 05/20/21 0249  AST 40 34  ALT 18 18  ALKPHOS 118 118  BILITOT 2.5* 2.7*  PROT 6.5 5.6*  ALBUMIN 2.8* 2.4*   Recent Labs  Lab 05/19/21 1445  LIPASE 51   CBC: Recent Labs  Lab 05/19/21 1445 05/20/21 0249  WBC 8.3 8.0  HGB 8.7* 7.6*  HCT 24.5* 20.6*  MCV 108.9* 105.6*  PLT 215 182   Studies/Results: CT Abdomen Pelvis W Contrast  Result Date: 05/19/2021 CLINICAL DATA:  Abdominal pain with nausea and vomiting, possible small bowel obstruction EXAM: CT ABDOMEN AND PELVIS WITH CONTRAST TECHNIQUE: Multidetector CT imaging of the abdomen and pelvis was performed using the standard protocol following bolus administration of intravenous contrast. CONTRAST:  149mL OMNIPAQUE IOHEXOL 300 MG/ML  SOLN COMPARISON:  09/19/2017, plain film from earlier in the same day. FINDINGS: Lower chest: Mild bibasilar atelectasis right greater than left. No focal parenchymal nodule is seen. Hepatobiliary: Diffuse decreased attenuation is noted within the liver without focal mass likely related to fatty infiltration. Common bile duct stent is noted in place. Air is noted within the biliary tree as well as within the gallbladder related to the  biliary stent. Multiple gallstones are seen. Perihepatic ascites is noted. Pancreas: Pancreatic stent is noted centrally. More peripheral pancreas is within normal limits. 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 Adrenals/Urinary Tract: Adrenal glands are within normal limits. Kidneys show no renal calculi or obstructive changes. Scattered cysts are noted bilaterally. Ureters are within normal limits. The bladder is partially distended. Stomach/Bowel: Colon shows no obstructive or inflammatory changes. The appendix is within normal limits without inflammatory change. Small bowel demonstrates diffuse small bowel dilatation with fecalization of bowel contents consistent with high-grade small bowel obstruction. There is a transition zone noted in the right mid abdomen best seen on image number 33 of series 6 and image number 42 of series 3. No discrete mass is noted in this region. Stomach is well distended with fluid. Gastric catheter is noted within the distal esophagus and should be advanced several cm deeper into the stomach. Vascular/Lymphatic: Aortic atherosclerosis. No enlarged abdominal or pelvic lymph nodes. Reproductive: Prostate is unremarkable. Other: No discrete hernia is noted. Ascites is seen likely of a reactive nature related to the small-bowel obstruction. No findings to suggest perforation are noted at this time. Musculoskeletal: Patchy sclerotic changes are noted within the bony structures similar to that seen on prior exam. This is likely related to the patient's underlying known history of sickle cell. IMPRESSION: Changes consistent with a small-bowel obstruction in the mid ileum. This is high-grade in nature with fecalization of bowel contents. The transition zone shows no mass. These changes are likely related to adhesions or underlying scarring. Cholelithiasis with evidence of pneumobilia secondary to a biliary stent. Pancreatic stent is noted as well. Changes consistent  with the known history of sickle cell disease. Gastric catheter is noted within the distal esophagus and should be advanced further into the stomach to allow for decompression. Electronically Signed   By: Inez Catalina M.D.   On: 05/19/2021 20:18   DG Abd Acute W/Chest  Addendum  Date: 05/19/2021   ADDENDUM REPORT: 05/19/2021 17:32 ADDENDUM: These results were called by telephone at the time of interpretation on 05/19/2021 at 5:32 pm to provider [facility] , who verbally acknowledged these results. Electronically Signed   By: Zetta Bills M.D.   On: 05/19/2021 17:32   Result Date: 05/19/2021 CLINICAL DATA:  Small-bowel obstruction suspected. EXAM: DG ABDOMEN ACUTE WITH 1 VIEW CHEST COMPARISON:  Comparison is made with preoperative chest x-ray February 14th of 2022. FINDINGS: Trachea midline. Cardiomediastinal contours and hilar structures with mild cardiomegaly and central pulmonary vascular engorgement. No signs of pneumothorax or consolidative changes. Juxta diaphragmatic atelectasis. No pneumothorax. No acute skeletal process about the bony thorax. Limited imaging due to patient position in stretcher due to patient condition by report. Gas beneath the LEFT hemidiaphragm, just below this are dilated loops of small bowel. This could reflect gas in the stomach move cephalad by the distended bowel loops. Biliary stent is in place to the RIGHT of L1, L2 and L3. Gallstone likely in the RIGHT upper quadrant. Pancreatic stent also in place. EKG leads project cross the patient's abdomen. Multiple air-fluid levels are present throughout the upper abdomen. Corresponding dilated loops of small bowel throughout the abdomen. There is some stool in the colon and a small amount of rectal stool and gas. On limited assessment there is no acute skeletal process. IMPRESSION: Signs of small bowel obstruction. Gas beneath the LEFT hemidiaphragm likely reflects gas in the stomach displaced cephalad by the distended bowel loops.  Do not favor free air in this setting but would suggest CT for further evaluation in this patient with signs of bowel obstruction. Biliary and pancreatic stents. Gallstone in the RIGHT upper quadrant. A call is out to the referring provider to further discuss findings in the above case. Electronically Signed: By: Zetta Bills M.D. On: 05/19/2021 17:25   DG Abd Portable 1V-Small Bowel Obstruction Protocol-initial, 8 hr delay  Result Date: 05/20/2021 CLINICAL DATA:  Small-bowel obstruction; 8 hour delay EXAM: PORTABLE ABDOMEN - 1 VIEW COMPARISON:  05/19/2021 FINDINGS: Enteric tube is within the stomach. There is residual enteric contrast. Probable persistent small bowel obstruction is not well evaluated on this supine study. Contrast is also present within the bladder. Calcified gallstone and common bile duct stent. Probable additional small stent within the main pancreatic duct. IMPRESSION: Probable persistent small bowel obstruction is not well evaluated on this supine study. There is residual enteric contrast. Electronically Signed   By: Macy Mis M.D.   On: 05/20/2021 10:15   DG Abd Portable 1V-Small Bowel Protocol-Position Verification  Result Date: 05/19/2021 CLINICAL DATA:  Encounter for imaging study to confirm nasogastric tube placement. EXAM: PORTABLE ABDOMEN - 1 VIEW COMPARISON:  Xr abdomen 05/19/21, CT abdomen pelvis 05/19/2021 FINDINGS: Interval advancement of an enteric tube coursing below the hemidiaphragm with tip and side port overlying the expected region the gastric lumen. Side port likely just distal to the gastroesophageal junction. A 1.5 cm calcified density overlying the right upper quadrant consistent with gallstone. The common bile duct stent is noted overlying the right mid abdomen. Known pancreatic duct not well visualized. Cardiomegaly.  Aortic calcification. IMPRESSION: Interval advancement of an enteric tube coursing below the hemidiaphragm with tip and side port overlying  the expected region the gastric lumen. Electronically Signed   By: Iven Finn M.D.   On: 05/19/2021 23:57   DG Abd Portable 1 View  Result Date: 05/19/2021 CLINICAL DATA:  Nasogastric tube placement EXAM: PORTABLE ABDOMEN - 1 VIEW  COMPARISON:  X-ray abdomen 05/19/2021 FINDINGS: Enteric tube noted coursing below the hemidiaphragm with tip overlying the gastric lumen and side port likely in the region of the distal esophagus/gastroesophageal junction. Query gaseous distension of small bowel within the left upper abdomen. IMPRESSION: 1. Enteric tube coursing below the hemidiaphragm with tip overlying the gastric lumen and side port likely in the region of the distal esophagus/gastroesophageal junction. Recommend advancement by 5 cm. 2. Query gaseous distension of small bowel within the left upper abdomen. Electronically Signed   By: Iven Finn M.D.   On: 05/19/2021 19:48    Dialysis Orders:  TTS at [facility] 4hr, 400/500, EDW 65kg, 2K/2Ca, UFP #2, AVF, no heparin - Sensipar 30mg  PO q HD - Venofer 50mg  IV weekly - Mircera 29mcg IV q 2 weeks (last 11/8)  Assessment/Plan:  SBO, recent gallstone with CBD/pancreatic duct stenting: NG decompression, for exploratory laparoscopy/cholecystectomy soon her surgery note.  ESRD:  Continue HD per usual TTS schedule - for HD later today. K 5.4 - will correct with HD.  Hypertension/volume: BP low sided, no edema on exam.  Anemia: Last Hgb 7.6 - check again pre-HD this afternoon, transfuse prn. Due for ESA as well -> give today.  Metabolic bone disease: Ca ok, Phos high - resume binders when cleared to eat.  Nutrition:  NG tube at the moment, will order supplements when cleared to eat.  A-fib: On Eliquis, on hold for possible surgery.    Veneta Penton, PA-C 05/20/2021, 11:48 AM  [employer]

## 2021-05-21 ENCOUNTER — Inpatient Hospital Stay (HOSPITAL_COMMUNITY): Payer: HMO

## 2021-05-21 DIAGNOSIS — R1084 Generalized abdominal pain: Secondary | ICD-10-CM | POA: Diagnosis not present

## 2021-05-21 DIAGNOSIS — I5022 Chronic systolic (congestive) heart failure: Secondary | ICD-10-CM | POA: Diagnosis not present

## 2021-05-21 DIAGNOSIS — K56609 Unspecified intestinal obstruction, unspecified as to partial versus complete obstruction: Secondary | ICD-10-CM | POA: Diagnosis not present

## 2021-05-21 DIAGNOSIS — N186 End stage renal disease: Secondary | ICD-10-CM | POA: Diagnosis not present

## 2021-05-21 LAB — COMPREHENSIVE METABOLIC PANEL
ALT: 18 U/L (ref 0–44)
AST: 35 U/L (ref 15–41)
Albumin: 2.2 g/dL — ABNORMAL LOW (ref 3.5–5.0)
Alkaline Phosphatase: 140 U/L — ABNORMAL HIGH (ref 38–126)
Anion gap: 11 (ref 5–15)
BUN: 33 mg/dL — ABNORMAL HIGH (ref 8–23)
CO2: 30 mmol/L (ref 22–32)
Calcium: 8.1 mg/dL — ABNORMAL LOW (ref 8.9–10.3)
Chloride: 94 mmol/L — ABNORMAL LOW (ref 98–111)
Creatinine, Ser: 3.55 mg/dL — ABNORMAL HIGH (ref 0.61–1.24)
GFR, Estimated: 18 mL/min — ABNORMAL LOW (ref >60)
Glucose, Bld: 105 mg/dL — ABNORMAL HIGH (ref 70–99)
Potassium: 4.2 mmol/L (ref 3.5–5.1)
Sodium: 135 mmol/L (ref 135–145)
Total Bilirubin: 3.6 mg/dL — ABNORMAL HIGH (ref 0.3–1.2)
Total Protein: 5.3 g/dL — ABNORMAL LOW (ref 6.5–8.1)

## 2021-05-21 LAB — CBC WITH DIFFERENTIAL/PLATELET
Abs Immature Granulocytes: 0.08 10*3/uL — ABNORMAL HIGH (ref 0.00–0.07)
Basophils Absolute: 0 10*3/uL (ref 0.0–0.1)
Basophils Relative: 0 %
Eosinophils Absolute: 0.1 10*3/uL (ref 0.0–0.5)
Eosinophils Relative: 1 %
HCT: 20.2 % — ABNORMAL LOW (ref 39.0–52.0)
Hemoglobin: 7.1 g/dL — ABNORMAL LOW (ref 13.0–17.0)
Immature Granulocytes: 1 %
Lymphocytes Relative: 4 %
Lymphs Abs: 0.3 10*3/uL — ABNORMAL LOW (ref 0.7–4.0)
MCH: 35 pg — ABNORMAL HIGH (ref 26.0–34.0)
MCHC: 35.1 g/dL (ref 30.0–36.0)
MCV: 99.5 fL (ref 80.0–100.0)
Monocytes Absolute: 1.7 10*3/uL — ABNORMAL HIGH (ref 0.1–1.0)
Monocytes Relative: 23 %
Neutro Abs: 5.4 10*3/uL (ref 1.7–7.7)
Neutrophils Relative %: 71 %
Platelets: 172 10*3/uL (ref 150–400)
RBC: 2.03 MIL/uL — ABNORMAL LOW (ref 4.22–5.81)
RDW: 25.2 % — ABNORMAL HIGH (ref 11.5–15.5)
WBC: 7.7 10*3/uL (ref 4.0–10.5)
nRBC: 1.3 % — ABNORMAL HIGH (ref 0.0–0.2)

## 2021-05-21 LAB — HEMOGLOBIN AND HEMATOCRIT, BLOOD
HCT: 23.6 % — ABNORMAL LOW (ref 39.0–52.0)
Hemoglobin: 8.4 g/dL — ABNORMAL LOW (ref 13.0–17.0)

## 2021-05-21 LAB — PREPARE RBC (CROSSMATCH)

## 2021-05-21 LAB — PHOSPHORUS: Phosphorus: 5.5 mg/dL — ABNORMAL HIGH (ref 2.5–4.6)

## 2021-05-21 LAB — MAGNESIUM: Magnesium: 1.9 mg/dL (ref 1.7–2.4)

## 2021-05-21 MED ORDER — DARBEPOETIN ALFA 200 MCG/0.4ML IJ SOSY
200.0000 ug | PREFILLED_SYRINGE | Freq: Once | INTRAMUSCULAR | Status: AC
  Administered 2021-05-21: 21:00:00 200 ug via SUBCUTANEOUS
  Filled 2021-05-21 (×2): qty 0.4

## 2021-05-21 MED ORDER — DEXTROSE-NACL 5-0.45 % IV SOLN: INTRAVENOUS | Status: AC

## 2021-05-21 MED ORDER — SODIUM CHLORIDE 0.9% IV SOLUTION: Freq: Once | INTRAVENOUS | Status: AC

## 2021-05-21 MED ORDER — IOPAMIDOL (ISOVUE-370) INJECTION 76%
80.0000 mL | Freq: Once | INTRAVENOUS | Status: AC | PRN
  Administered 2021-05-21: 01:00:00 80 mL via INTRAVENOUS

## 2021-05-21 NOTE — Consult Note (Signed)
CONSULT NOTE FOR Patrick [name]  Reason for Consult: Anemia and SBO Referring Physician: [facility]  Jaymond D Silbernagel HPI: This is a 65 year old male with a PMH of ESRD, CHF, afib on Eliquis, choledocholithiasis s/p ERCP with stone extraction, cholelithiasis, and gout admitted for an SBO.  He was recently discharged from a hospitalization for the same issue in [place].  It is reported that he was treated conservatively.  During that admission he was found to have choledocholithiasis and he was successfully treated with an ERCP.  The patient was recommended a lap chole, but he declined.  After a four day hospitalization he was discharged home, but he started to experience similar symptoms over the past couple of days.  His abdominal pain worsened along with his nausea and vomiting.  As a result he presented to the ER for further evaluation and treatment.  Last evening he was noted to have a worsened baseline anemia.  During the [facility] hospitalization he was discharged with an HGB of 7.9 g/dL, but his admission HGB at that time was 5.7 g/dL.  The patient has a history of anemia and bleeding.  Dr. Fuller Plan performed an EGD on 08/04/2020 for hematemesis with negative findings.  Dr. Lyndel Safe performed an EGD/colonoscopy on 09/24/2017 for IDA and an erosive gastritis was found.  No pathology was noted with the biopsies.  Past Medical History:  Diagnosis Date   Blood dyscrasia    Blood transfusion    "I've had a bunch of them"   CHF (congestive heart failure) ([place])    followed by hf clinic   Chronic kidney disease (CKD), stage III (moderate) ([place])    Dialysis patient ([place]) 04/2019   AV fistula (right arm)   Gout    Headache(784.0)    "probably weekly" (01/13/2014)   Legally blind    Shortness of breath dyspnea    Sickle cell disease, type SS (HCC)    Swelling abdomen    Swelling of extremity, left    Swelling of extremity, right     Past Surgical History:  Procedure Laterality Date   A/V  FISTULAGRAM Right 07/29/2020   Procedure: A/V FISTULAGRAM - Right Arm;  Surgeon: Angelia Mould, MD;  Location: [place] [facility];  Service: Cardiovascular;  Laterality: Right;   BASCILIC VEIN TRANSPOSITION Right 04/12/2014   Procedure: BASILIC VEIN TRANSPOSITION;  Surgeon: Elam Dutch, MD;  Location: [place];  Service: Vascular;  Laterality: Right;   BIOPSY  08/04/2020   Procedure: BIOPSY;  Surgeon: Ladene Artist, MD;  Location: [facility] ENDOSCOPY;  Service: Gastroenterology;;   CARDIAC CATHETERIZATION  05/2011   CARDIOVERSION N/A 06/05/2018   Procedure: CARDIOVERSION;  Surgeon: Jolaine Artist, MD;  Location: [place];  Service: Cardiovascular;  Laterality: N/A;   CARDIOVERSION N/A 10/06/2019   Procedure: CARDIOVERSION;  Surgeon: Jolaine Artist, MD;  Location: [place];  Service: Cardiovascular;  Laterality: N/A;   COLONOSCOPY WITH PROPOFOL N/A 09/24/2017   Procedure: COLONOSCOPY WITH PROPOFOL;  Surgeon: Jackquline Denmark, MD;  Location: [place];  Service: Endoscopy;  Laterality: N/A;   ESOPHAGOGASTRODUODENOSCOPY N/A 09/19/2017   Procedure: ESOPHAGOGASTRODUODENOSCOPY (EGD);  Surgeon: Jackquline Denmark, MD;  Location: [facility] ENDOSCOPY;  Service: Endoscopy;  Laterality: N/A;   ESOPHAGOGASTRODUODENOSCOPY (EGD) WITH PROPOFOL N/A 08/04/2020   Procedure: ESOPHAGOGASTRODUODENOSCOPY (EGD) WITH PROPOFOL;  Surgeon: Ladene Artist, MD;  Location: [place];  Service: Gastroenterology;  Laterality: N/A;   LEFT AND RIGHT HEART CATHETERIZATION WITH CORONARY ANGIOGRAM N/A 06/11/2011   Procedure: LEFT AND RIGHT  HEART CATHETERIZATION WITH CORONARY ANGIOGRAM;  Surgeon: Larey Dresser, MD;  Location[facility] CATH LAB;  Service: Cardiovascular;  Laterality: N/A;   PERIPHERAL VASCULAR BALLOON ANGIOPLASTY Right 07/29/2020   Procedure: PERIPHERAL VASCULAR BALLOON ANGIOPLASTY;  Surgeon: Angelia Mould, MD;  Location: [place] CV LAB;  Service: Cardiovascular;  Laterality: Right;  arm fistula    REVISON OF ARTERIOVENOUS FISTULA Right 08/08/2020   Procedure: ANEURYSM EXCISION OF RIGHT UPPER EXTREMITY ARTERIOVENOUS FISTULA;  Surgeon: Angelia Mould, MD;  Location: [place];  Service: Vascular;  Laterality: Right;   TEE WITHOUT CARDIOVERSION N/A 12/27/2015   Procedure: TRANSESOPHAGEAL ECHOCARDIOGRAM (TEE);  Surgeon: Sueanne Margarita, MD;  Location: [facility] ENDOSCOPY;  Service: Cardiovascular;  Laterality: N/A;    Family History  Problem Relation Age of Onset   Alcohol abuse Mother    Liver disease Mother    Cirrhosis Mother    Heart attack Mother     Social History:  reports that he quit smoking about 5 years ago. His smoking use included cigarettes. He has a 10.25 pack-year smoking history. He has never used smokeless tobacco. He reports that he does not currently use alcohol after a past usage of about 3.0 standard drinks per week. He reports that he does not use drugs.  Allergies: No Known Allergies  Medications: Scheduled:  sodium chloride   Intravenous Once   Chlorhexidine Gluconate Cloth  6 each Topical Q0600   [START ON 05/27/2021] darbepoetin (ARANESP) injection - DIALYSIS  200 mcg Intravenous Q Sat-HD   fluticasone furoate-vilanterol  1 puff Inhalation Daily   And   umeclidinium bromide  1 puff Inhalation Daily   pantoprazole (PROTONIX) IV  40 mg Intravenous Q12H   Continuous:  Results for orders placed or performed during the hospital encounter of 05/19/21 (from the past 24 hour(s))  Hepatitis B surface antigen     Status: None   Collection Time: 05/20/21  1:02 PM  Result Value Ref Range   Hepatitis B Surface Ag NON REACTIVE NON REACTIVE  Hepatitis B surface antibody     Status: Abnormal   Collection Time: 05/20/21  1:02 PM  Result Value Ref Range   Hep B S Ab Reactive (A) NON REACTIVE  CBC     Status: Abnormal   Collection Time: 05/20/21  2:20 PM  Result Value Ref Range   WBC 6.2 4.0 - 10.5 K/uL   RBC 1.78 (L) 4.22 - 5.81 MIL/uL   Hemoglobin 6.9 (LL) 13.0 -  17.0 g/dL   HCT 18.8 (L) 39.0 - 52.0 %   MCV 105.6 (H) 80.0 - 100.0 fL   MCH 38.8 (H) 26.0 - 34.0 pg   MCHC 36.7 (H) 30.0 - 36.0 g/dL   RDW 18.5 (H) 11.5 - 15.5 %   Platelets 153 150 - 400 K/uL   nRBC 0.6 (H) 0.0 - 0.2 %  Prepare RBC (crossmatch)     Status: None   Collection Time: 05/20/21  4:01 PM  Result Value Ref Range   Order Confirmation      ORDER PROCESSED BY BLOOD BANK Performed at [facility], [address], [place], [place] [address]   Type and screen [place]     Status: None (Preliminary result)   Collection Time: 05/20/21  4:10 PM  Result Value Ref Range   ABO/RH(D) AB POS    Antibody Screen NEG    Sample Expiration 05/23/2021,2359    Unit Number [id number]    Blood Component Type RED CELLS,LR  Unit division 00    Status of Unit ISSUED    Transfusion Status OK TO TRANSFUSE    Crossmatch Result Compatible    Donor AG Type      NEGATIVE FOR C ANTIGEN NEGATIVE FOR E ANTIGEN NEGATIVE FOR KELL ANTIGEN Performed at [facility], [place] [address], [place], [place] [address]   CBC with Differential/Platelet     Status: Abnormal   Collection Time: 05/21/21  2:48 AM  Result Value Ref Range   WBC 7.7 4.0 - 10.5 K/uL   RBC 2.03 (L) 4.22 - 5.81 MIL/uL   Hemoglobin 7.1 (L) 13.0 - 17.0 g/dL   HCT 20.2 (L) 39.0 - 52.0 %   MCV 99.5 80.0 - 100.0 fL   MCH 35.0 (H) 26.0 - 34.0 pg   MCHC 35.1 30.0 - 36.0 g/dL   RDW 25.2 (H) 11.5 - 15.5 %   Platelets 172 150 - 400 K/uL   nRBC 1.3 (H) 0.0 - 0.2 %   Neutrophils Relative % 71 %   Neutro Abs 5.4 1.7 - 7.7 K/uL   Lymphocytes Relative 4 %   Lymphs Abs 0.3 (L) 0.7 - 4.0 K/uL   Monocytes Relative 23 %   Monocytes Absolute 1.7 (H) 0.1 - 1.0 K/uL   Eosinophils Relative 1 %   Eosinophils Absolute 0.1 0.0 - 0.5 K/uL   Basophils Relative 0 %   Basophils Absolute 0.0 0.0 - 0.1 K/uL   Immature Granulocytes 1 %   Abs Immature Granulocytes 0.08 (H) 0.00 - 0.07 K/uL   Pappenheimer Bodies PRESENT     Polychromasia PRESENT    Target Cells PRESENT   Comprehensive metabolic panel     Status: Abnormal   Collection Time: 05/21/21  2:48 AM  Result Value Ref Range   Sodium 135 135 - 145 mmol/L   Potassium 4.2 3.5 - 5.1 mmol/L   Chloride 94 (L) 98 - 111 mmol/L   CO2 30 22 - 32 mmol/L   Glucose, Bld 105 (H) 70 - 99 mg/dL   BUN 33 (H) 8 - 23 mg/dL   Creatinine, Ser 3.55 (H) 0.61 - 1.24 mg/dL   Calcium 8.1 (L) 8.9 - 10.3 mg/dL   Total Protein 5.3 (L) 6.5 - 8.1 g/dL   Albumin 2.2 (L) 3.5 - 5.0 g/dL   AST 35 15 - 41 U/L   ALT 18 0 - 44 U/L   Alkaline Phosphatase 140 (H) 38 - 126 U/L   Total Bilirubin 3.6 (H) 0.3 - 1.2 mg/dL   GFR, Estimated 18 (L) >60 mL/min   Anion gap 11 5 - 15  Magnesium     Status: None   Collection Time: 05/21/21  2:48 AM  Result Value Ref Range   Magnesium 1.9 1.7 - 2.4 mg/dL  Phosphorus     Status: Abnormal   Collection Time: 05/21/21  2:48 AM  Result Value Ref Range   Phosphorus 5.5 (H) 2.5 - 4.6 mg/dL     CT ABDOMEN PELVIS W CONTRAST  Result Date: 05/21/2021 CLINICAL DATA:  Abdominal pain EXAM: CT ABDOMEN AND PELVIS WITH CONTRAST TECHNIQUE: Multidetector CT imaging of the abdomen and pelvis was performed using the standard protocol following bolus administration of intravenous contrast. CONTRAST:  26mL ISOVUE-370 IOPAMIDOL (ISOVUE-370) INJECTION 76% COMPARISON:  05/19/2021 FINDINGS: LOWER CHEST: Moderate cardiomegaly HEPATOBILIARY: There is a common bile duct stent. There are stones and air within the gallbladder. Large amount of upper ascites, increased. Mild hepatic nodularity. PANCREAS: Normal pancreas. Pancreatic stent in  unchanged position. No ductal dilatation or peripancreatic fluid collection. SPLEEN: Calcified splenic remnant ADRENALS/URINARY TRACT: The adrenal glands are normal. No hydronephrosis, nephroureterolithiasis or solid renal mass. The urinary bladder is normal for degree of distention STOMACH/BOWEL: There is no hiatal hernia. Normal duodenal  course and caliber. There is diffusely dilated small bowel, slightly progressed from the prior study. The terminal ileum is decompressed. No focal colonic abnormality. Normal appendix. VASCULAR/LYMPHATIC: Normal course and caliber of the major abdominal vessels. No abdominal or pelvic lymphadenopathy. REPRODUCTIVE: Normal prostate size with symmetric seminal vesicles. MUSCULOSKELETAL. Bone marrow changes reflecting sickle cell disease. OTHER: None. IMPRESSION: 1. Slightly progressed, diffusely dilated small bowel with decompressed terminal ileum, consistent with small bowel obstruction. 2. Large amount of upper abdominal ascites, increased from the prior study. 3. Moderate cardiomegaly. Aortic Atherosclerosis (ICD10-I70.0). Electronically Signed   By: Ulyses Jarred M.D.   On: 05/21/2021 01:38   CT Abdomen Pelvis W Contrast  Result Date: 05/19/2021 CLINICAL DATA:  Abdominal pain with nausea and vomiting, possible small bowel obstruction EXAM: CT ABDOMEN AND PELVIS WITH CONTRAST TECHNIQUE: Multidetector CT imaging of the abdomen and pelvis was performed using the standard protocol following bolus administration of intravenous contrast. CONTRAST:  117mL OMNIPAQUE IOHEXOL 300 MG/ML  SOLN COMPARISON:  09/19/2017, plain film from earlier in the same day. FINDINGS: Lower chest: Mild bibasilar atelectasis right greater than left. No focal parenchymal nodule is seen. Hepatobiliary: Diffuse decreased attenuation is noted within the liver without focal mass likely related to fatty infiltration. Common bile duct stent is noted in place. Air is noted within the biliary tree as well as within the gallbladder related to the biliary stent. Multiple gallstones are seen. Perihepatic ascites is noted. Pancreas: Pancreatic stent is noted centrally. More peripheral pancreas is within normal limits. 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 Adrenals/Urinary Tract: Adrenal glands are within normal limits. Kidneys  show no renal calculi or obstructive changes. Scattered cysts are noted bilaterally. Ureters are within normal limits. The bladder is partially distended. Stomach/Bowel: Colon shows no obstructive or inflammatory changes. The appendix is within normal limits without inflammatory change. Small bowel demonstrates diffuse small bowel dilatation with fecalization of bowel contents consistent with high-grade small bowel obstruction. There is a transition zone noted in the right mid abdomen best seen on image number 33 of series 6 and image number 42 of series 3. No discrete mass is noted in this region. Stomach is well distended with fluid. Gastric catheter is noted within the distal esophagus and should be advanced several cm deeper into the stomach. Vascular/Lymphatic: Aortic atherosclerosis. No enlarged abdominal or pelvic lymph nodes. Reproductive: Prostate is unremarkable. Other: No discrete hernia is noted. Ascites is seen likely of a reactive nature related to the small-bowel obstruction. No findings to suggest perforation are noted at this time. Musculoskeletal: Patchy sclerotic changes are noted within the bony structures similar to that seen on prior exam. This is likely related to the patient's underlying known history of sickle cell. IMPRESSION: Changes consistent with a small-bowel obstruction in the mid ileum. This is high-grade in nature with fecalization of bowel contents. The transition zone shows no mass. These changes are likely related to adhesions or underlying scarring. Cholelithiasis with evidence of pneumobilia secondary to a biliary stent. Pancreatic stent is noted as well. Changes consistent with the known history of sickle cell disease. Gastric catheter is noted within the distal esophagus and should be advanced further into the stomach to allow for decompression. Electronically Signed   By:  Inez Catalina M.D.   On: 05/19/2021 20:18   DG Abd Acute W/Chest  Addendum Date: 05/19/2021    ADDENDUM REPORT: 05/19/2021 17:32 ADDENDUM: These results were called by telephone at the time of interpretation on 05/19/2021 at 5:32 pm to provider [facility] , who verbally acknowledged these results. Electronically Signed   By: Zetta Bills M.D.   On: 05/19/2021 17:32   Result Date: 05/19/2021 CLINICAL DATA:  Small-bowel obstruction suspected. EXAM: DG ABDOMEN ACUTE WITH 1 VIEW CHEST COMPARISON:  Comparison is made with preoperative chest x-ray February 14th of 2022. FINDINGS: Trachea midline. Cardiomediastinal contours and hilar structures with mild cardiomegaly and central pulmonary vascular engorgement. No signs of pneumothorax or consolidative changes. Juxta diaphragmatic atelectasis. No pneumothorax. No acute skeletal process about the bony thorax. Limited imaging due to patient position in stretcher due to patient condition by report. Gas beneath the LEFT hemidiaphragm, just below this are dilated loops of small bowel. This could reflect gas in the stomach move cephalad by the distended bowel loops. Biliary stent is in place to the RIGHT of L1, L2 and L3. Gallstone likely in the RIGHT upper quadrant. Pancreatic stent also in place. EKG leads project cross the patient's abdomen. Multiple air-fluid levels are present throughout the upper abdomen. Corresponding dilated loops of small bowel throughout the abdomen. There is some stool in the colon and a small amount of rectal stool and gas. On limited assessment there is no acute skeletal process. IMPRESSION: Signs of small bowel obstruction. Gas beneath the LEFT hemidiaphragm likely reflects gas in the stomach displaced cephalad by the distended bowel loops. Do not favor free air in this setting but would suggest CT for further evaluation in this patient with signs of bowel obstruction. Biliary and pancreatic stents. Gallstone in the RIGHT upper quadrant. A call is out to the referring provider to further discuss findings in the above case.  Electronically Signed: By: Zetta Bills M.D. On: 05/19/2021 17:25   DG Abd Portable 1V-Small Bowel Obstruction Protocol-initial, 8 hr delay  Result Date: 05/20/2021 CLINICAL DATA:  Small-bowel obstruction; 8 hour delay EXAM: PORTABLE ABDOMEN - 1 VIEW COMPARISON:  05/19/2021 FINDINGS: Enteric tube is within the stomach. There is residual enteric contrast. Probable persistent small bowel obstruction is not well evaluated on this supine study. Contrast is also present within the bladder. Calcified gallstone and common bile duct stent. Probable additional small stent within the main pancreatic duct. IMPRESSION: Probable persistent small bowel obstruction is not well evaluated on this supine study. There is residual enteric contrast. Electronically Signed   By: Macy Mis M.D.   On: 05/20/2021 10:15   DG Abd Portable 1V-Small Bowel Protocol-Position Verification  Result Date: 05/19/2021 CLINICAL DATA:  Encounter for imaging study to confirm nasogastric tube placement. EXAM: PORTABLE ABDOMEN - 1 VIEW COMPARISON:  Xr abdomen 05/19/21, CT abdomen pelvis 05/19/2021 FINDINGS: Interval advancement of an enteric tube coursing below the hemidiaphragm with tip and side port overlying the expected region the gastric lumen. Side port likely just distal to the gastroesophageal junction. A 1.5 cm calcified density overlying the right upper quadrant consistent with gallstone. The common bile duct stent is noted overlying the right mid abdomen. Known pancreatic duct not well visualized. Cardiomegaly.  Aortic calcification. IMPRESSION: Interval advancement of an enteric tube coursing below the hemidiaphragm with tip and side port overlying the expected region the gastric lumen. Electronically Signed   By: Iven Finn M.D.   On: 05/19/2021 23:57   DG Abd Portable 1  View  Result Date: 05/19/2021 CLINICAL DATA:  Nasogastric tube placement EXAM: PORTABLE ABDOMEN - 1 VIEW COMPARISON:  X-ray abdomen 05/19/2021 FINDINGS:  Enteric tube noted coursing below the hemidiaphragm with tip overlying the gastric lumen and side port likely in the region of the distal esophagus/gastroesophageal junction. Query gaseous distension of small bowel within the left upper abdomen. IMPRESSION: 1. Enteric tube coursing below the hemidiaphragm with tip overlying the gastric lumen and side port likely in the region of the distal esophagus/gastroesophageal junction. Recommend advancement by 5 cm. 2. Query gaseous distension of small bowel within the left upper abdomen. Electronically Signed   By: Iven Finn M.D.   On: 05/19/2021 19:48    ROS:  As stated above in the HPI otherwise negative.  Blood pressure (!) 88/70, pulse (!) 121, temperature 97.9 F (36.6 C), temperature source Oral, resp. rate 16, height 5\' 11"  (1.803 m), weight 67.3 kg, SpO2 94 %.    PE: Gen: Alert, uncomfortable HEENT:  [place]/AT, EOMI, NG tube in place Neck: Supple, no LAD Lungs: CTA Bilaterally CV: RRR without M/G/R ABD: Soft, mildly distended, tympanic, scant bowel sounds Ext: No C/C/E  Assessment/Plan: 1) Recurrent SBO. 2) Acute on chronic anemia. 3) ESRD. 4) Cholelithiasis.   The patient's HGB appears to be stable.  He tends to range from the high 6 range to the 9 range.  Work up was performed in the recent past without any overt source of bleeding.  His SBO is being treated conservatively with an NG tube.  Plan: 1) Monitor HGB and transfuse if necessary. 2) Continue with NG tube and serial KUBs. 3) Discuss with the patient about a lap chole for his cholelithiasis. 4) [place] [name] will assume care in the AM.  Jahmar Mckelvy D 05/21/2021, 9:29 AM

## 2021-05-21 NOTE — Progress Notes (Signed)
[facility] Progress Note   Subjective:  Seen in room - doesn't feel good, curled up on L side. Denies dyspnea. NG tube putting out much less today. GI and surgery following. S/p HD yesterday - no UF, ended up net + 463mL. BP low and tachycardic this morning, could give small fluid bolus 277mL if needed or another unit blood.  Objective Vitals:   05/21/21 0700 05/21/21 0745 05/21/21 0822 05/21/21 1009  BP:  95/63 (!) 88/70 106/77  Pulse:  (!) 120 (!) 121 (!) 116  Resp: 16 16    Temp:  97.9 F (36.6 C)  98.2 F (36.8 C)  TempSrc:  Oral  Oral  SpO2:  99% 94% 95%  Weight:      Height:       Physical Exam General: Ill appearing man, NAD. NG tube in place Heart: Mild tachycardia, no murmur Lungs: CTAB Abdomen: Curled up and hard to examine Extremities: No LE edema Dialysis Access:  AVF + bruit  Additional Objective Labs: Basic Metabolic Panel: Recent Labs  Lab 05/19/21 1445 05/20/21 0249 05/21/21 0248  NA 133* 132* 135  K 5.0 5.4* 4.2  CL 91* 91* 94*  CO2 29 28 30   GLUCOSE 108* 108* 105*  BUN 40* 51* 33*  CREATININE 5.55* 5.77* 3.55*  CALCIUM 8.7* 8.4* 8.1*  PHOS  --  6.3* 5.5*   Liver Function Tests: Recent Labs  Lab 05/19/21 1445 05/20/21 0249 05/21/21 0248  AST 40 34 35  ALT 18 18 18   ALKPHOS 118 118 140*  BILITOT 2.5* 2.7* 3.6*  PROT 6.5 5.6* 5.3*  ALBUMIN 2.8* 2.4* 2.2*   Recent Labs  Lab 05/19/21 1445  LIPASE 51   CBC: Recent Labs  Lab 05/19/21 1445 05/20/21 0249 05/20/21 1420 05/21/21 0248  WBC 8.3 8.0 6.2 7.7  NEUTROABS  --   --   --  5.4  HGB 8.7* 7.6* 6.9* 7.1*  HCT 24.5* 20.6* 18.8* 20.2*  MCV 108.9* 105.6* 105.6* 99.5  PLT 215 182 153 172   Studies/Results: CT ABDOMEN PELVIS W CONTRAST  Result Date: 05/21/2021 CLINICAL DATA:  Abdominal pain EXAM: CT ABDOMEN AND PELVIS WITH CONTRAST TECHNIQUE: Multidetector CT imaging of the abdomen and pelvis was performed using the standard protocol following bolus administration  of intravenous contrast. CONTRAST:  55mL ISOVUE-370 IOPAMIDOL (ISOVUE-370) INJECTION 76% COMPARISON:  05/19/2021 FINDINGS: LOWER CHEST: Moderate cardiomegaly HEPATOBILIARY: There is a common bile duct stent. There are stones and air within the gallbladder. Large amount of upper ascites, increased. Mild hepatic nodularity. PANCREAS: Normal pancreas. Pancreatic stent in unchanged position. No ductal dilatation or peripancreatic fluid collection. SPLEEN: Calcified splenic remnant ADRENALS/URINARY TRACT: The adrenal glands are normal. No hydronephrosis, nephroureterolithiasis or solid renal mass. The urinary bladder is normal for degree of distention STOMACH/BOWEL: There is no hiatal hernia. Normal duodenal course and caliber. There is diffusely dilated small bowel, slightly progressed from the prior study. The terminal ileum is decompressed. No focal colonic abnormality. Normal appendix. VASCULAR/LYMPHATIC: Normal course and caliber of the major abdominal vessels. No abdominal or pelvic lymphadenopathy. REPRODUCTIVE: Normal prostate size with symmetric seminal vesicles. MUSCULOSKELETAL. Bone marrow changes reflecting sickle cell disease. OTHER: None. IMPRESSION: 1. Slightly progressed, diffusely dilated small bowel with decompressed terminal ileum, consistent with small bowel obstruction. 2. Large amount of upper abdominal ascites, increased from the prior study. 3. Moderate cardiomegaly. Aortic Atherosclerosis (ICD10-I70.0). Electronically Signed   By: Ulyses Jarred M.D.   On: 05/21/2021 01:38   CT Abdomen Pelvis W Contrast  Result Date: 05/19/2021 CLINICAL DATA:  Abdominal pain with nausea and vomiting, possible small bowel obstruction EXAM: CT ABDOMEN AND PELVIS WITH CONTRAST TECHNIQUE: Multidetector CT imaging of the abdomen and pelvis was performed using the standard protocol following bolus administration of intravenous contrast. CONTRAST:  136mL OMNIPAQUE IOHEXOL 300 MG/ML  SOLN COMPARISON:  09/19/2017,  plain film from earlier in the same day. FINDINGS: Lower chest: Mild bibasilar atelectasis right greater than left. No focal parenchymal nodule is seen. Hepatobiliary: Diffuse decreased attenuation is noted within the liver without focal mass likely related to fatty infiltration. Common bile duct stent is noted in place. Air is noted within the biliary tree as well as within the gallbladder related to the biliary stent. Multiple gallstones are seen. Perihepatic ascites is noted. Pancreas: Pancreatic stent is noted centrally. More peripheral pancreas is within normal limits. 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 Adrenals/Urinary Tract: Adrenal glands are within normal limits. Kidneys show no renal calculi or obstructive changes. Scattered cysts are noted bilaterally. Ureters are within normal limits. The bladder is partially distended. Stomach/Bowel: Colon shows no obstructive or inflammatory changes. The appendix is within normal limits without inflammatory change. Small bowel demonstrates diffuse small bowel dilatation with fecalization of bowel contents consistent with high-grade small bowel obstruction. There is a transition zone noted in the right mid abdomen best seen on image number 33 of series 6 and image number 42 of series 3. No discrete mass is noted in this region. Stomach is well distended with fluid. Gastric catheter is noted within the distal esophagus and should be advanced several cm deeper into the stomach. Vascular/Lymphatic: Aortic atherosclerosis. No enlarged abdominal or pelvic lymph nodes. Reproductive: Prostate is unremarkable. Other: No discrete hernia is noted. Ascites is seen likely of a reactive nature related to the small-bowel obstruction. No findings to suggest perforation are noted at this time. Musculoskeletal: Patchy sclerotic changes are noted within the bony structures similar to that seen on prior exam. This is likely related to the patient's underlying  known history of sickle cell. IMPRESSION: Changes consistent with a small-bowel obstruction in the mid ileum. This is high-grade in nature with fecalization of bowel contents. The transition zone shows no mass. These changes are likely related to adhesions or underlying scarring. Cholelithiasis with evidence of pneumobilia secondary to a biliary stent. Pancreatic stent is noted as well. Changes consistent with the known history of sickle cell disease. Gastric catheter is noted within the distal esophagus and should be advanced further into the stomach to allow for decompression. Electronically Signed   By: Inez Catalina M.D.   On: 05/19/2021 20:18   DG Abd Acute W/Chest  Addendum Date: 05/19/2021   ADDENDUM REPORT: 05/19/2021 17:32 ADDENDUM: These results were called by telephone at the time of interpretation on 05/19/2021 at 5:32 pm to provider [facility] , who verbally acknowledged these results. Electronically Signed   By: Zetta Bills M.D.   On: 05/19/2021 17:32   Result Date: 05/19/2021 CLINICAL DATA:  Small-bowel obstruction suspected. EXAM: DG ABDOMEN ACUTE WITH 1 VIEW CHEST COMPARISON:  Comparison is made with preoperative chest x-ray February 14th of 2022. FINDINGS: Trachea midline. Cardiomediastinal contours and hilar structures with mild cardiomegaly and central pulmonary vascular engorgement. No signs of pneumothorax or consolidative changes. Juxta diaphragmatic atelectasis. No pneumothorax. No acute skeletal process about the bony thorax. Limited imaging due to patient position in stretcher due to patient condition by report. Gas beneath the LEFT hemidiaphragm, just below this are dilated loops of  small bowel. This could reflect gas in the stomach move cephalad by the distended bowel loops. Biliary stent is in place to the RIGHT of L1, L2 and L3. Gallstone likely in the RIGHT upper quadrant. Pancreatic stent also in place. EKG leads project cross the patient's abdomen. Multiple air-fluid levels  are present throughout the upper abdomen. Corresponding dilated loops of small bowel throughout the abdomen. There is some stool in the colon and a small amount of rectal stool and gas. On limited assessment there is no acute skeletal process. IMPRESSION: Signs of small bowel obstruction. Gas beneath the LEFT hemidiaphragm likely reflects gas in the stomach displaced cephalad by the distended bowel loops. Do not favor free air in this setting but would suggest CT for further evaluation in this patient with signs of bowel obstruction. Biliary and pancreatic stents. Gallstone in the RIGHT upper quadrant. A call is out to the referring provider to further discuss findings in the above case. Electronically Signed: By: Zetta Bills M.D. On: 05/19/2021 17:25   DG Abd Portable 1V-Small Bowel Obstruction Protocol-initial, 8 hr delay  Result Date: 05/20/2021 CLINICAL DATA:  Small-bowel obstruction; 8 hour delay EXAM: PORTABLE ABDOMEN - 1 VIEW COMPARISON:  05/19/2021 FINDINGS: Enteric tube is within the stomach. There is residual enteric contrast. Probable persistent small bowel obstruction is not well evaluated on this supine study. Contrast is also present within the bladder. Calcified gallstone and common bile duct stent. Probable additional small stent within the main pancreatic duct. IMPRESSION: Probable persistent small bowel obstruction is not well evaluated on this supine study. There is residual enteric contrast. Electronically Signed   By: Macy Mis M.D.   On: 05/20/2021 10:15   DG Abd Portable 1V-Small Bowel Protocol-Position Verification  Result Date: 05/19/2021 CLINICAL DATA:  Encounter for imaging study to confirm nasogastric tube placement. EXAM: PORTABLE ABDOMEN - 1 VIEW COMPARISON:  Xr abdomen 05/19/21, CT abdomen pelvis 05/19/2021 FINDINGS: Interval advancement of an enteric tube coursing below the hemidiaphragm with tip and side port overlying the expected region the gastric lumen. Side port  likely just distal to the gastroesophageal junction. A 1.5 cm calcified density overlying the right upper quadrant consistent with gallstone. The common bile duct stent is noted overlying the right mid abdomen. Known pancreatic duct not well visualized. Cardiomegaly.  Aortic calcification. IMPRESSION: Interval advancement of an enteric tube coursing below the hemidiaphragm with tip and side port overlying the expected region the gastric lumen. Electronically Signed   By: Iven Finn M.D.   On: 05/19/2021 23:57   DG Abd Portable 1 View  Result Date: 05/19/2021 CLINICAL DATA:  Nasogastric tube placement EXAM: PORTABLE ABDOMEN - 1 VIEW COMPARISON:  X-ray abdomen 05/19/2021 FINDINGS: Enteric tube noted coursing below the hemidiaphragm with tip overlying the gastric lumen and side port likely in the region of the distal esophagus/gastroesophageal junction. Query gaseous distension of small bowel within the left upper abdomen. IMPRESSION: 1. Enteric tube coursing below the hemidiaphragm with tip overlying the gastric lumen and side port likely in the region of the distal esophagus/gastroesophageal junction. Recommend advancement by 5 cm. 2. Query gaseous distension of small bowel within the left upper abdomen. Electronically Signed   By: Iven Finn M.D.   On: 05/19/2021 19:48   Medications:   sodium chloride   Intravenous Once   Chlorhexidine Gluconate Cloth  6 each Topical Q0600   [START ON 05/27/2021] darbepoetin (ARANESP) injection - DIALYSIS  200 mcg Intravenous Q Sat-HD   fluticasone furoate-vilanterol  1 puff  Inhalation Daily   And   umeclidinium bromide  1 puff Inhalation Daily   pantoprazole (PROTONIX) IV  40 mg Intravenous Q12H    Dialysis Orders: TTS at AF 4hr, 400/500, EDW 65kg, 2K/2Ca, UFP #2, AVF, no heparin - Sensipar 30mg  PO q HD - Venofer 50mg  IV weekly - Mircera 235mcg IV q 2 weeks (last 11/8)   Assessment/Plan:  SBO, recent gallstone with CBD/pancreatic duct stenting: NG  decompression, for exploratory laparoscopy/cholecystectomy soon, per surgery note - possibly tomorrow.Marland Kitchen  ESRD:  Continue HD per usual TTS schedule - next HD 12/6. No heparin.  Hypertension/volume: BP low sided, no edema on exam.  Anemia: Hgb 7.6 -> 6.9 and transfused 1U PRBCs, now 7.1 this AM. Aranesp missed with HD yesterday -> change to [place] and give today.  Metabolic bone disease: Ca/Phos ok - resume binders when cleared to eat.  Nutrition:  NG tube at the moment, will order supplements when cleared to eat.  A-fib: On Eliquis, on hold for possible surgery.  Veneta Penton, PA-C 05/21/2021, 10:15 AM  [employer]

## 2021-05-21 NOTE — Progress Notes (Signed)
Subjective/Chief Complaint: Patient awake and alert.  Feels about the same.  Still has abdominal pain which is about a 5-6 out of 10.  He does require frequent pain medications.  He states he has passed gas.  KUB from yesterday reviewed which shows stool in the colon with contrast.  Report does not report that but this was for my personal review.  NG tube recorded for 10 cc but nothing recorded overnight.  Tube appears to be working.   Objective: Vital signs in last 24 hours: Temp:  [96.8 F (36 C)-98 F (36.7 C)] 97.9 F (36.6 C) (12/04 0745) Pulse Rate:  [99-121] 121 (12/04 0822) Resp:  [11-21] 16 (12/04 0745) BP: (74-125)/(47-88) 88/70 (12/04 0822) SpO2:  [94 %-100 %] 94 % (12/04 0822) Weight:  [67.2 kg-67.3 kg] 67.3 kg (12/04 0450) Last BM Date:  (PTA)  Intake/Output from previous day: 12/03 0701 - 12/04 0700 In: 320.8 [I.V.:0.8; Blood:320] Out: 1175 [Urine:175; Emesis/NG output:1400] Intake/Output this shift: No intake/output data recorded.  General appearance: alert and cooperative Resp: clear to auscultation bilaterally Cardio: regular rate and rhythm GI: Soft mild tenderness in the central abdomen without rebound or guarding.  NG tube noted.  Bilious output noted.  Lab Results:  Recent Labs    05/20/21 1420 05/21/21 0248  WBC 6.2 7.7  HGB 6.9* 7.1*  HCT 18.8* 20.2*  PLT 153 172   BMET Recent Labs    05/20/21 0249 05/21/21 0248  NA 132* 135  K 5.4* 4.2  CL 91* 94*  CO2 28 30  GLUCOSE 108* 105*  BUN 51* 33*  CREATININE 5.77* 3.55*  CALCIUM 8.4* 8.1*   PT/INR Recent Labs    05/20/21 0249  LABPROT 17.7*  INR 1.5*   ABG No results for input(s): PHART, HCO3 in the last 72 hours.  Invalid input(s): PCO2, PO2  Studies/Results: CT ABDOMEN PELVIS W CONTRAST  Result Date: 05/21/2021 CLINICAL DATA:  Abdominal pain EXAM: CT ABDOMEN AND PELVIS WITH CONTRAST TECHNIQUE: Multidetector CT imaging of the abdomen and pelvis was performed using the  standard protocol following bolus administration of intravenous contrast. CONTRAST:  10mL ISOVUE-370 IOPAMIDOL (ISOVUE-370) INJECTION 76% COMPARISON:  05/19/2021 FINDINGS: LOWER CHEST: Moderate cardiomegaly HEPATOBILIARY: There is a common bile duct stent. There are stones and air within the gallbladder. Large amount of upper ascites, increased. Mild hepatic nodularity. PANCREAS: Normal pancreas. Pancreatic stent in unchanged position. No ductal dilatation or peripancreatic fluid collection. SPLEEN: Calcified splenic remnant ADRENALS/URINARY TRACT: The adrenal glands are normal. No hydronephrosis, nephroureterolithiasis or solid renal mass. The urinary bladder is normal for degree of distention STOMACH/BOWEL: There is no hiatal hernia. Normal duodenal course and caliber. There is diffusely dilated small bowel, slightly progressed from the prior study. The terminal ileum is decompressed. No focal colonic abnormality. Normal appendix. VASCULAR/LYMPHATIC: Normal course and caliber of the major abdominal vessels. No abdominal or pelvic lymphadenopathy. REPRODUCTIVE: Normal prostate size with symmetric seminal vesicles. MUSCULOSKELETAL. Bone marrow changes reflecting sickle cell disease. OTHER: None. IMPRESSION: 1. Slightly progressed, diffusely dilated small bowel with decompressed terminal ileum, consistent with small bowel obstruction. 2. Large amount of upper abdominal ascites, increased from the prior study. 3. Moderate cardiomegaly. Aortic Atherosclerosis (ICD10-I70.0). Electronically Signed   By: Ulyses Jarred M.D.   On: 05/21/2021 01:38   CT Abdomen Pelvis W Contrast  Result Date: 05/19/2021 CLINICAL DATA:  Abdominal pain with nausea and vomiting, possible small bowel obstruction EXAM: CT ABDOMEN AND PELVIS WITH CONTRAST TECHNIQUE: Multidetector CT imaging of the abdomen and  pelvis was performed using the standard protocol following bolus administration of intravenous contrast. CONTRAST:  1102mL OMNIPAQUE  IOHEXOL 300 MG/ML  SOLN COMPARISON:  09/19/2017, plain film from earlier in the same day. FINDINGS: Lower chest: Mild bibasilar atelectasis right greater than left. No focal parenchymal nodule is seen. Hepatobiliary: Diffuse decreased attenuation is noted within the liver without focal mass likely related to fatty infiltration. Common bile duct stent is noted in place. Air is noted within the biliary tree as well as within the gallbladder related to the biliary stent. Multiple gallstones are seen. Perihepatic ascites is noted. Pancreas: Pancreatic stent is noted centrally. More peripheral pancreas is within normal limits. 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 Adrenals/Urinary Tract: Adrenal glands are within normal limits. Kidneys show no renal calculi or obstructive changes. Scattered cysts are noted bilaterally. Ureters are within normal limits. The bladder is partially distended. Stomach/Bowel: Colon shows no obstructive or inflammatory changes. The appendix is within normal limits without inflammatory change. Small bowel demonstrates diffuse small bowel dilatation with fecalization of bowel contents consistent with high-grade small bowel obstruction. There is a transition zone noted in the right mid abdomen best seen on image number 33 of series 6 and image number 42 of series 3. No discrete mass is noted in this region. Stomach is well distended with fluid. Gastric catheter is noted within the distal esophagus and should be advanced several cm deeper into the stomach. Vascular/Lymphatic: Aortic atherosclerosis. No enlarged abdominal or pelvic lymph nodes. Reproductive: Prostate is unremarkable. Other: No discrete hernia is noted. Ascites is seen likely of a reactive nature related to the small-bowel obstruction. No findings to suggest perforation are noted at this time. Musculoskeletal: Patchy sclerotic changes are noted within the bony structures similar to that seen on prior exam. This  is likely related to the patient's underlying known history of sickle cell. IMPRESSION: Changes consistent with a small-bowel obstruction in the mid ileum. This is high-grade in nature with fecalization of bowel contents. The transition zone shows no mass. These changes are likely related to adhesions or underlying scarring. Cholelithiasis with evidence of pneumobilia secondary to a biliary stent. Pancreatic stent is noted as well. Changes consistent with the known history of sickle cell disease. Gastric catheter is noted within the distal esophagus and should be advanced further into the stomach to allow for decompression. Electronically Signed   By: Inez Catalina M.D.   On: 05/19/2021 20:18   DG Abd Acute W/Chest  Addendum Date: 05/19/2021   ADDENDUM REPORT: 05/19/2021 17:32 ADDENDUM: These results were called by telephone at the time of interpretation on 05/19/2021 at 5:32 pm to provider [facility] , who verbally acknowledged these results. Electronically Signed   By: Zetta Bills M.D.   On: 05/19/2021 17:32   Result Date: 05/19/2021 CLINICAL DATA:  Small-bowel obstruction suspected. EXAM: DG ABDOMEN ACUTE WITH 1 VIEW CHEST COMPARISON:  Comparison is made with preoperative chest x-ray February 14th of 2022. FINDINGS: Trachea midline. Cardiomediastinal contours and hilar structures with mild cardiomegaly and central pulmonary vascular engorgement. No signs of pneumothorax or consolidative changes. Juxta diaphragmatic atelectasis. No pneumothorax. No acute skeletal process about the bony thorax. Limited imaging due to patient position in stretcher due to patient condition by report. Gas beneath the LEFT hemidiaphragm, just below this are dilated loops of small bowel. This could reflect gas in the stomach move cephalad by the distended bowel loops. Biliary stent is in place to the RIGHT of L1, L2 and L3. Gallstone likely  in the RIGHT upper quadrant. Pancreatic stent also in place. EKG leads project cross the  patient's abdomen. Multiple air-fluid levels are present throughout the upper abdomen. Corresponding dilated loops of small bowel throughout the abdomen. There is some stool in the colon and a small amount of rectal stool and gas. On limited assessment there is no acute skeletal process. IMPRESSION: Signs of small bowel obstruction. Gas beneath the LEFT hemidiaphragm likely reflects gas in the stomach displaced cephalad by the distended bowel loops. Do not favor free air in this setting but would suggest CT for further evaluation in this patient with signs of bowel obstruction. Biliary and pancreatic stents. Gallstone in the RIGHT upper quadrant. A call is out to the referring provider to further discuss findings in the above case. Electronically Signed: By: Zetta Bills M.D. On: 05/19/2021 17:25   DG Abd Portable 1V-Small Bowel Obstruction Protocol-initial, 8 hr delay  Result Date: 05/20/2021 CLINICAL DATA:  Small-bowel obstruction; 8 hour delay EXAM: PORTABLE ABDOMEN - 1 VIEW COMPARISON:  05/19/2021 FINDINGS: Enteric tube is within the stomach. There is residual enteric contrast. Probable persistent small bowel obstruction is not well evaluated on this supine study. Contrast is also present within the bladder. Calcified gallstone and common bile duct stent. Probable additional small stent within the main pancreatic duct. IMPRESSION: Probable persistent small bowel obstruction is not well evaluated on this supine study. There is residual enteric contrast. Electronically Signed   By: Macy Mis M.D.   On: 05/20/2021 10:15   DG Abd Portable 1V-Small Bowel Protocol-Position Verification  Result Date: 05/19/2021 CLINICAL DATA:  Encounter for imaging study to confirm nasogastric tube placement. EXAM: PORTABLE ABDOMEN - 1 VIEW COMPARISON:  Xr abdomen 05/19/21, CT abdomen pelvis 05/19/2021 FINDINGS: Interval advancement of an enteric tube coursing below the hemidiaphragm with tip and side port overlying the  expected region the gastric lumen. Side port likely just distal to the gastroesophageal junction. A 1.5 cm calcified density overlying the right upper quadrant consistent with gallstone. The common bile duct stent is noted overlying the right mid abdomen. Known pancreatic duct not well visualized. Cardiomegaly.  Aortic calcification. IMPRESSION: Interval advancement of an enteric tube coursing below the hemidiaphragm with tip and side port overlying the expected region the gastric lumen. Electronically Signed   By: Iven Finn M.D.   On: 05/19/2021 23:57   DG Abd Portable 1 View  Result Date: 05/19/2021 CLINICAL DATA:  Nasogastric tube placement EXAM: PORTABLE ABDOMEN - 1 VIEW COMPARISON:  X-ray abdomen 05/19/2021 FINDINGS: Enteric tube noted coursing below the hemidiaphragm with tip overlying the gastric lumen and side port likely in the region of the distal esophagus/gastroesophageal junction. Query gaseous distension of small bowel within the left upper abdomen. IMPRESSION: 1. Enteric tube coursing below the hemidiaphragm with tip overlying the gastric lumen and side port likely in the region of the distal esophagus/gastroesophageal junction. Recommend advancement by 5 cm. 2. Query gaseous distension of small bowel within the left upper abdomen. Electronically Signed   By: Iven Finn M.D.   On: 05/19/2021 19:48    Anti-infectives: Anti-infectives (From admission, onward)    None       Assessment/Plan: Small bowel obstruction-films reviewed which shows what appears to be contrast in stool in his colon.  There is a mild distention pattern noted.  Repeat KUB.  Continue NG tube.  If no improvement of the next 24 hours, may require surgical intervention.   LOS: 2 days    Joyice Faster Roslynn Holte 05/21/2021

## 2021-05-21 NOTE — Progress Notes (Signed)
PROGRESS NOTE    Patrick [name]  UJW:[id number] DOB: [birth year] DOA: 05/19/2021 PCP: Dorena Dew, FNP   Brief Narrative:  The patient is a 65 year old African-American male with a past medical history significant for but not limited to ESRD on dialysis Tuesday, Thursdays, Saturday, anemia of chronic kidney disease as well as history of sickle cell with baseline hemoglobin of 5-62, chronic systolic CHF, chronic atrial fibrillation with anticoagulant Eliquis as well as other comorbidities who presented with small bowel obstruction after presenting from his home to the ED complaining of abdominal pain.  He was recently hospitalized at [facility] from 05/09/2025 for small bowel obstruction and subsequently resolved with conservative measures in addition to management of choledocholithiasis via ERCP stone removal and stents to the common bile duct as well as the pancreatic duct.  During his previous hospitalization general surgery reportedly recommended cholecystectomy but the patient ultimately refused this measure at that time.  His additional hospital course was notable for acute on chronic anemia with a hemoglobin on the day of admission of 5.7 requiring PRBCs as an swelling hemoglobin improved to follow value of 7.9 at the time of discharge.  Over last few days since being discharged patient has had worsening generalized abdominal discomfort and it is initially intermittent but then became constant and was worse with palpation or attempts to eat or drink anything with that timeframe.  He also notes having new nausea and vomiting last few days is nonbloody and nonbilious emesis along that timeframe with most recent episodes occurring in the ED.  He also noted a decline in his flatus and has had a bowel movement 2 to 3 days.  Normally he takes as needed oxycodone as an outpatient.  He presented the ED and was admitted for small bowel obstruction and a NG tube placed in the ED is attached to low  intermittent wall suction.  In the ED he received fentanyl, Zofran and a normal saline bolus.  Because he has a AICD patient nephrology was consulted for maintenance of dialysis and he underwent dialysis today.  Patient's blood count has been steadily dropping and he had a very large amount of dark bilious drainage from his NG with possible blood in it.  Given his decline in hemoglobin he was placed on a PPI twice daily and GI was called for further evaluation and we will obtain a gastric occult on the contents from his NG.  GI recommends continuing to monitor CBGs and his blood count was on the lower side again this morning.  Typed and screened and transfused 1 unit PRBCs this AM.  General surgery recommending continuing NG tube and if he does not improve the next 24 hours that he may require surgical intervention.  Assessment & Plan:   Principal Problem:   SBO (small bowel obstruction) (HCC) Active Problems:   Abdominal pain   Nausea & vomiting   Anemia   End stage renal disease (HCC)   Permanent atrial fibrillation (HCC)   Chronic systolic CHF (congestive heart failure) (HCC)    Small bowel obstruction -His worsening abdominal pain in the last 3 to 4 days associated with nausea, vomiting, decreased flatus production as well as imaging showing high-grade small bowel obstruction in the mid ileum with transition zone and no bowel abscess or perforation. -Recently hospitalized in [place] for small bowel obstruction that resolved with conservative -General surgery consulted for further evaluation and recommending continue NG tube in place and testing to low intermittent wall suction -Pain control -  Early mobilization and sure electrolytes are within normal limits.  We will ensure potassium greater than 4 and if magnesium level greater than 2 -NG tube continues to put out but not as much as yesterday.  Is a little hypotensive and tachycardic with A. fib with RVR -Continue supportive care and  antiemetics as patient is strict n.p.o. -General surgery consulted and recommending a small bowel protocol and NG tube for decompression; patient has no history of reported surgeries but general surgery is recommending a diagnostic laparoscopically with lysis of adhesions and simultaneous laparoscopic cholecystectomy and performing this in the next few days; general surgery recommends continue NG tube for now and supportive care and repeat a KUB.  They felt like the patient had contrast and stool in his colon but if he does not improve the next 24 to 48 hours he will require surgical intervention  End stage renal disease on hemodialysis Tuesday Thursday Saturday Hyponatremia Hyperphosphatemia -Patient underwent dialysis yesterday and nephrology been consulted -He did have hyperkalemia but this was able to be corrected in dialysis -Strict I's and O's and daily weights -Continue to monitor renal function carefully and BUN/creatinine went from 40/5.55 -> 51/5.77 -> 33/3.55 -Further care per nephrology appreciate their assistance  Acute on chronic anemia in setting of sickle cell anemia and chronic kidney disease -Patient is a baseline hemoglobin/hematocrit of 8-10 -Presented with hemoglobin of 8.7 however has been slowly trending down given his dark bilious fluid question of whether there is blood in -Empirically start PPI twice daily and consulted GI for further recommendations and will obtain gastric occult; GI recommends continuing conservative measures and continue monitoring -Given that his hemoglobin dropped to 6.9 in dialysis we will type and screen and transfuse 1 unit PBC; after 1 unit PRBCs his hemoglobin/hematocrit is now 7.1/20.2 so we will type and screen and transfuse another 1 unit PRBC -Patient missed our snap with dialysis yesterday so there were no changes subcu given today -Continue to monitor for signs and symptoms of bleeding; may need more blood transfusion given his sickle cell  anemia -Continue to monitor and trend CBC and check INR in the a.m.  Chronic systolic CHF -Echo done in the last year showed EF of 40 to 45% with indeterminate diastolic parameters -Patient has volume managed with dialysis; will try cautious IV fluid hydration with D5 half-normal saline given his n.p.o. status at 50 MLS per hour -Hold his home antihypertensives given his n.p.o. status and conservative management of SBO -Continue monitor and trend strict I's and O's and daily weights  Chronic atrial fibrillation now with RVR -CHA2DS2-VASc score is 3 -He is chronically anticoagulated with Eliquis and on carvedilol for beta-blockade -Had an echocardiogram last year that showed severely dilated bilateral atria as well as moderate to severe mitral regurgitation -Presented with rate controlled A. fib without evidence of acute ischemic changes -Currently hold his apixaban if he may go to surgery this week and may consider heparin drip but will hold for now given that his blood count is dropping and that he is getting 2 units of PRBCs today -Given that his heart rate is RVR may need to discuss with his cardiology team as he sees Dr. Haroldine Laws outpatient  COPD -Patient is a former smoker and quit in 2007 -Continue with his ministry and albuterol inhaler -Continue to monitor for respiratory compromise  Hyperbilirubinemia -In the setting of sickle cell anemia and hemolysis -Patient's T bili trended up slightly and went from 2.5 and now 2.7 and is further  worsened to 3.6 -Patient got dialysis yesterday and received 1 unit of PRBCs yesterday and will be getting another 1 unit today -Continue to monitor and trend and repeat CMP in a.m.  DVT prophylaxis: SCDs and will hold his anticoagulation given he may go for surgical intervention Code Status: FULL CODE  Family Communication: No family currently at bedside Disposition Plan: Pending further clinical improvement and improvement of his small bowel  obstruction and clearance by specialists  Status is: Inpatient  Remains inpatient appropriate because: Remains n.p.o. and still has no bowel movements and will likely be going for surgical intervention  Consultants:  General Surgery Nephrology Gastroenterology  Procedures: NG tube placement  Antimicrobials:  Anti-infectives (From admission, onward)    None        Subjective: Seen and examined at bedside and was not feeling as well but thinks his abdominal pain is stabilized.  Laying on his right side and does not feel as well.  Due to his loss.  Continues to have tachycardic rates.  Still has NG tube in place.  No other concerns or complaints this time.  Objective: Vitals:   05/21/21 1239 05/21/21 1330 05/21/21 1340 05/21/21 1524  BP: 105/65  107/74 109/77  Pulse:   (!) 110 (!) 116  Resp:   16   Temp:   (!) 97.5 F (36.4 C)   TempSrc:  Axillary Axillary   SpO2: 99%   97%  Weight:      Height:        Intake/Output Summary (Last 24 hours) at 05/21/2021 1546 Last data filed at 05/21/2021 1340 Gross per 24 hour  Intake 635.83 ml  Output 25 ml  Net 610.83 ml    Filed Weights   05/20/21 1350 05/20/21 1730 05/21/21 0450  Weight: 67.2 kg 67.3 kg 67.3 kg   Examination: Physical Exam:  Constitutional: Thin chronically ill-appearing African-American male currently in mild distress appears uncomfortable laying on his right side. Eyes: Sclera slightly icteric ENMT: External Ears, Nose appear normal. Grossly normal hearing.  NG tube is in place Neck: Appears normal, supple, no cervical masses, normal ROM, no appreciable thyromegaly: No appreciable JVD Respiratory: Diminished to auscultation bilaterally with coarse breath sounds, no wheezing, rales, rhonchi or crackles. Normal respiratory effort and patient is not tachypenic. No accessory muscle use.  Unlabored breathing not wearing supplemental oxygen nasal cannula Cardiovascular: Irregularly irregular and tachycardic, no  murmurs / rubs / gallops. S1 and S2 auscultated.  Slight lower extremity edema Abdomen: Soft, a little tender to palpate.  A little distended and bowel sounds are diminished.   GU: Deferred. Musculoskeletal: No clubbing / cyanosis of digits/nails. No joint deformity upper and lower extremities. Skin: No rashes, lesions, ulcers on limited skin evaluation. No induration; Warm and dry.  Neurologic: CN 2-12 grossly intact with no focal deficits.  Romberg sign and cerebellar reflexes not assessed.  Psychiatric: Normal judgment and insight. Alert and oriented x 3. Normal mood and appropriate affect.   Data Reviewed: I have personally reviewed following labs and imaging studies  CBC: Recent Labs  Lab 05/19/21 1445 05/20/21 0249 05/20/21 1420 05/21/21 0248  WBC 8.3 8.0 6.2 7.7  NEUTROABS  --   --   --  5.4  HGB 8.7* 7.6* 6.9* 7.1*  HCT 24.5* 20.6* 18.8* 20.2*  MCV 108.9* 105.6* 105.6* 99.5  PLT 215 182 153 308    Basic Metabolic Panel: Recent Labs  Lab 05/19/21 1445 05/20/21 0249 05/21/21 0248  NA 133* 132* 135  K 5.0  5.4* 4.2  CL 91* 91* 94*  CO2 29 28 30   GLUCOSE 108* 108* 105*  BUN 40* 51* 33*  CREATININE 5.55* 5.77* 3.55*  CALCIUM 8.7* 8.4* 8.1*  MG  --  2.1 1.9  PHOS  --  6.3* 5.5*    GFR: Estimated Creatinine Clearance: 20.3 mL/min (A) (by C-G formula based on SCr of 3.55 mg/dL (H)). Liver Function Tests: Recent Labs  Lab 05/19/21 1445 05/20/21 0249 05/21/21 0248  AST 40 34 35  ALT 18 18 18   ALKPHOS 118 118 140*  BILITOT 2.5* 2.7* 3.6*  PROT 6.5 5.6* 5.3*  ALBUMIN 2.8* 2.4* 2.2*    Recent Labs  Lab 05/19/21 1445  LIPASE 51    No results for input(s): AMMONIA in the last 168 hours. Coagulation Profile: Recent Labs  Lab 05/20/21 0249  INR 1.5*    Cardiac Enzymes: No results for input(s): CKTOTAL, CKMB, CKMBINDEX, TROPONINI in the last 168 hours. BNP (last 3 results) No results for input(s): PROBNP in the last 8760 hours. HbA1C: No results for  input(s): HGBA1C in the last 72 hours. CBG: No results for input(s): GLUCAP in the last 168 hours. Lipid Profile: No results for input(s): CHOL, HDL, LDLCALC, TRIG, CHOLHDL, LDLDIRECT in the last 72 hours. Thyroid Function Tests: No results for input(s): TSH, T4TOTAL, FREET4, T3FREE, THYROIDAB in the last 72 hours. Anemia Panel: No results for input(s): VITAMINB12, FOLATE, FERRITIN, TIBC, IRON, RETICCTPCT in the last 72 hours. Sepsis Labs: No results for input(s): PROCALCITON, LATICACIDVEN in the last 168 hours.  Recent Results (from the past 240 hour(s))  Resp Panel by RT-PCR (Flu A&B, Covid) Nasopharyngeal Swab     Status: None   Collection Time: 05/19/21  6:29 PM   Specimen: Nasopharyngeal Swab; Nasopharyngeal(NP) swabs in vial transport medium  Result Value Ref Range Status   SARS Coronavirus 2 by RT PCR NEGATIVE NEGATIVE Final    Comment: (NOTE) SARS-CoV-2 target nucleic acids are NOT DETECTED.  The SARS-CoV-2 RNA is generally detectable in upper respiratory specimens during the acute phase of infection. The lowest concentration of SARS-CoV-2 viral copies this assay can detect is 138 copies/mL. A negative result does not preclude SARS-Cov-2 infection and should not be used as the sole basis for treatment or other patient management decisions. A negative result may occur with  improper specimen collection/handling, submission of specimen other than nasopharyngeal swab, presence of viral mutation(s) within the areas targeted by this assay, and inadequate number of viral copies(<138 copies/mL). A negative result must be combined with clinical observations, patient history, and epidemiological information. The expected result is Negative.  Fact Sheet for Patients:  EntrepreneurPulse.com.au  Fact Sheet for Healthcare Providers:  IncredibleEmployment.be  This test is no t yet approved or cleared by the [place] FDA and  has been  authorized for detection and/or diagnosis of SARS-CoV-2 by FDA under an Emergency Use Authorization (EUA). This EUA will remain  in effect (meaning this test can be used) for the duration of the COVID-19 declaration under Section 564(b)(1) of the Act, 21 U.S.C.section 360bbb-3(b)(1), unless the authorization is terminated  or revoked sooner.       Influenza A by PCR NEGATIVE NEGATIVE Final   Influenza B by PCR NEGATIVE NEGATIVE Final    Comment: (NOTE) The Xpert Xpress SARS-CoV-2/FLU/RSV plus assay is intended as an aid in the diagnosis of influenza from Nasopharyngeal swab specimens and should not be used as a sole basis for treatment. Nasal washings and aspirates are unacceptable for Xpert Xpress  SARS-CoV-2/FLU/RSV testing.  Fact Sheet for Patients: EntrepreneurPulse.com.au  Fact Sheet for Healthcare Providers: IncredibleEmployment.be  This test is not yet approved or cleared by the [place] FDA and has been authorized for detection and/or diagnosis of SARS-CoV-2 by FDA under an Emergency Use Authorization (EUA). This EUA will remain in effect (meaning this test can be used) for the duration of the COVID-19 declaration under Section 564(b)(1) of the Act, 21 U.S.C. section 360bbb-3(b)(1), unless the authorization is terminated or revoked.  Performed at [facility], [place] [address] [place], [place] [address]      RN Pressure Injury Documentation:     Estimated body mass index is 20.68 kg/m as calculated from the following:   Height as of this encounter: 5\' 11"  (1.803 m).   Weight as of this encounter: 67.3 kg.  Malnutrition Type:   Malnutrition Characteristics:   Nutrition Interventions:   Radiology Studies: DG Abd 1 View  Result Date: 05/21/2021 CLINICAL DATA:  Small bowel obstruction. EXAM: ABDOMEN - 1 VIEW COMPARISON:  05/20/2021 FINDINGS: 0857 hours. Relatively gasless abdomen consistent with the diffuse  fluid-filled small bowel seen on CT scan earlier today. NG tube tip is in the stomach with proximal side port of the NG tube at or just above the GE junction. Common bile duct stent again noted. IMPRESSION: Relatively gasless abdomen consistent with diffuse fluid-filled small bowel seen on CT scan earlier today. Proximal side port of the NG tube is at or just above the GE junction. NG tube could be advanced 3-4 cm to ensure side port placement below the GE junction. Electronically Signed   By: Misty Stanley M.D.   On: 05/21/2021 10:51   CT ABDOMEN PELVIS W CONTRAST  Result Date: 05/21/2021 CLINICAL DATA:  Abdominal pain EXAM: CT ABDOMEN AND PELVIS WITH CONTRAST TECHNIQUE: Multidetector CT imaging of the abdomen and pelvis was performed using the standard protocol following bolus administration of intravenous contrast. CONTRAST:  52mL ISOVUE-370 IOPAMIDOL (ISOVUE-370) INJECTION 76% COMPARISON:  05/19/2021 FINDINGS: LOWER CHEST: Moderate cardiomegaly HEPATOBILIARY: There is a common bile duct stent. There are stones and air within the gallbladder. Large amount of upper ascites, increased. Mild hepatic nodularity. PANCREAS: Normal pancreas. Pancreatic stent in unchanged position. No ductal dilatation or peripancreatic fluid collection. SPLEEN: Calcified splenic remnant ADRENALS/URINARY TRACT: The adrenal glands are normal. No hydronephrosis, nephroureterolithiasis or solid renal mass. The urinary bladder is normal for degree of distention STOMACH/BOWEL: There is no hiatal hernia. Normal duodenal course and caliber. There is diffusely dilated small bowel, slightly progressed from the prior study. The terminal ileum is decompressed. No focal colonic abnormality. Normal appendix. VASCULAR/LYMPHATIC: Normal course and caliber of the major abdominal vessels. No abdominal or pelvic lymphadenopathy. REPRODUCTIVE: Normal prostate size with symmetric seminal vesicles. MUSCULOSKELETAL. Bone marrow changes reflecting sickle  cell disease. OTHER: None. IMPRESSION: 1. Slightly progressed, diffusely dilated small bowel with decompressed terminal ileum, consistent with small bowel obstruction. 2. Large amount of upper abdominal ascites, increased from the prior study. 3. Moderate cardiomegaly. Aortic Atherosclerosis (ICD10-I70.0). Electronically Signed   By: Ulyses Jarred M.D.   On: 05/21/2021 01:38   CT Abdomen Pelvis W Contrast  Result Date: 05/19/2021 CLINICAL DATA:  Abdominal pain with nausea and vomiting, possible small bowel obstruction EXAM: CT ABDOMEN AND PELVIS WITH CONTRAST TECHNIQUE: Multidetector CT imaging of the abdomen and pelvis was performed using the standard protocol following bolus administration of intravenous contrast. CONTRAST:  131mL OMNIPAQUE IOHEXOL 300 MG/ML  SOLN COMPARISON:  09/19/2017, plain film from earlier  in the same day. FINDINGS: Lower chest: Mild bibasilar atelectasis right greater than left. No focal parenchymal nodule is seen. Hepatobiliary: Diffuse decreased attenuation is noted within the liver without focal mass likely related to fatty infiltration. Common bile duct stent is noted in place. Air is noted within the biliary tree as well as within the gallbladder related to the biliary stent. Multiple gallstones are seen. Perihepatic ascites is noted. Pancreas: Pancreatic stent is noted centrally. More peripheral pancreas is within normal limits. 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 Adrenals/Urinary Tract: Adrenal glands are within normal limits. Kidneys show no renal calculi or obstructive changes. Scattered cysts are noted bilaterally. Ureters are within normal limits. The bladder is partially distended. Stomach/Bowel: Colon shows no obstructive or inflammatory changes. The appendix is within normal limits without inflammatory change. Small bowel demonstrates diffuse small bowel dilatation with fecalization of bowel contents consistent with high-grade small bowel  obstruction. There is a transition zone noted in the right mid abdomen best seen on image number 33 of series 6 and image number 42 of series 3. No discrete mass is noted in this region. Stomach is well distended with fluid. Gastric catheter is noted within the distal esophagus and should be advanced several cm deeper into the stomach. Vascular/Lymphatic: Aortic atherosclerosis. No enlarged abdominal or pelvic lymph nodes. Reproductive: Prostate is unremarkable. Other: No discrete hernia is noted. Ascites is seen likely of a reactive nature related to the small-bowel obstruction. No findings to suggest perforation are noted at this time. Musculoskeletal: Patchy sclerotic changes are noted within the bony structures similar to that seen on prior exam. This is likely related to the patient's underlying known history of sickle cell. IMPRESSION: Changes consistent with a small-bowel obstruction in the mid ileum. This is high-grade in nature with fecalization of bowel contents. The transition zone shows no mass. These changes are likely related to adhesions or underlying scarring. Cholelithiasis with evidence of pneumobilia secondary to a biliary stent. Pancreatic stent is noted as well. Changes consistent with the known history of sickle cell disease. Gastric catheter is noted within the distal esophagus and should be advanced further into the stomach to allow for decompression. Electronically Signed   By: Inez Catalina M.D.   On: 05/19/2021 20:18   DG Abd Acute W/Chest  Addendum Date: 05/19/2021   ADDENDUM REPORT: 05/19/2021 17:32 ADDENDUM: These results were called by telephone at the time of interpretation on 05/19/2021 at 5:32 pm to provider [facility] , who verbally acknowledged these results. Electronically Signed   By: Zetta Bills M.D.   On: 05/19/2021 17:32   Result Date: 05/19/2021 CLINICAL DATA:  Small-bowel obstruction suspected. EXAM: DG ABDOMEN ACUTE WITH 1 VIEW CHEST COMPARISON:  Comparison is made  with preoperative chest x-ray February 14th of 2022. FINDINGS: Trachea midline. Cardiomediastinal contours and hilar structures with mild cardiomegaly and central pulmonary vascular engorgement. No signs of pneumothorax or consolidative changes. Juxta diaphragmatic atelectasis. No pneumothorax. No acute skeletal process about the bony thorax. Limited imaging due to patient position in stretcher due to patient condition by report. Gas beneath the LEFT hemidiaphragm, just below this are dilated loops of small bowel. This could reflect gas in the stomach move cephalad by the distended bowel loops. Biliary stent is in place to the RIGHT of L1, L2 and L3. Gallstone likely in the RIGHT upper quadrant. Pancreatic stent also in place. EKG leads project cross the patient's abdomen. Multiple air-fluid levels are present throughout the upper abdomen. Corresponding dilated loops  of small bowel throughout the abdomen. There is some stool in the colon and a small amount of rectal stool and gas. On limited assessment there is no acute skeletal process. IMPRESSION: Signs of small bowel obstruction. Gas beneath the LEFT hemidiaphragm likely reflects gas in the stomach displaced cephalad by the distended bowel loops. Do not favor free air in this setting but would suggest CT for further evaluation in this patient with signs of bowel obstruction. Biliary and pancreatic stents. Gallstone in the RIGHT upper quadrant. A call is out to the referring provider to further discuss findings in the above case. Electronically Signed: By: Zetta Bills M.D. On: 05/19/2021 17:25   DG Abd Portable 1V-Small Bowel Obstruction Protocol-initial, 8 hr delay  Result Date: 05/20/2021 CLINICAL DATA:  Small-bowel obstruction; 8 hour delay EXAM: PORTABLE ABDOMEN - 1 VIEW COMPARISON:  05/19/2021 FINDINGS: Enteric tube is within the stomach. There is residual enteric contrast. Probable persistent small bowel obstruction is not well evaluated on this  supine study. Contrast is also present within the bladder. Calcified gallstone and common bile duct stent. Probable additional small stent within the main pancreatic duct. IMPRESSION: Probable persistent small bowel obstruction is not well evaluated on this supine study. There is residual enteric contrast. Electronically Signed   By: Macy Mis M.D.   On: 05/20/2021 10:15   DG Abd Portable 1V-Small Bowel Protocol-Position Verification  Result Date: 05/19/2021 CLINICAL DATA:  Encounter for imaging study to confirm nasogastric tube placement. EXAM: PORTABLE ABDOMEN - 1 VIEW COMPARISON:  Xr abdomen 05/19/21, CT abdomen pelvis 05/19/2021 FINDINGS: Interval advancement of an enteric tube coursing below the hemidiaphragm with tip and side port overlying the expected region the gastric lumen. Side port likely just distal to the gastroesophageal junction. A 1.5 cm calcified density overlying the right upper quadrant consistent with gallstone. The common bile duct stent is noted overlying the right mid abdomen. Known pancreatic duct not well visualized. Cardiomegaly.  Aortic calcification. IMPRESSION: Interval advancement of an enteric tube coursing below the hemidiaphragm with tip and side port overlying the expected region the gastric lumen. Electronically Signed   By: Iven Finn M.D.   On: 05/19/2021 23:57   DG Abd Portable 1 View  Result Date: 05/19/2021 CLINICAL DATA:  Nasogastric tube placement EXAM: PORTABLE ABDOMEN - 1 VIEW COMPARISON:  X-ray abdomen 05/19/2021 FINDINGS: Enteric tube noted coursing below the hemidiaphragm with tip overlying the gastric lumen and side port likely in the region of the distal esophagus/gastroesophageal junction. Query gaseous distension of small bowel within the left upper abdomen. IMPRESSION: 1. Enteric tube coursing below the hemidiaphragm with tip overlying the gastric lumen and side port likely in the region of the distal esophagus/gastroesophageal junction.  Recommend advancement by 5 cm. 2. Query gaseous distension of small bowel within the left upper abdomen. Electronically Signed   By: Iven Finn M.D.   On: 05/19/2021 19:48    Scheduled Meds:  Chlorhexidine Gluconate Cloth  6 each Topical Q0600   darbepoetin (ARANESP) injection - DIALYSIS  200 mcg Subcutaneous Once   fluticasone furoate-vilanterol  1 puff Inhalation Daily   And   umeclidinium bromide  1 puff Inhalation Daily   pantoprazole (PROTONIX) IV  40 mg Intravenous Q12H   Continuous Infusions:  dextrose 5 % and 0.45% NaCl 50 mL/hr at 05/21/21 1521    LOS: 2 days   Kerney Elbe, DO [facility] PAGER is on AMION  If 7PM-7AM, please contact night-coverage [email or web address]

## 2021-05-22 ENCOUNTER — Inpatient Hospital Stay (HOSPITAL_COMMUNITY): Payer: HMO

## 2021-05-22 DIAGNOSIS — I4891 Unspecified atrial fibrillation: Secondary | ICD-10-CM

## 2021-05-22 DIAGNOSIS — Z01810 Encounter for preprocedural cardiovascular examination: Secondary | ICD-10-CM

## 2021-05-22 DIAGNOSIS — N186 End stage renal disease: Secondary | ICD-10-CM | POA: Diagnosis not present

## 2021-05-22 DIAGNOSIS — K56609 Unspecified intestinal obstruction, unspecified as to partial versus complete obstruction: Secondary | ICD-10-CM | POA: Diagnosis not present

## 2021-05-22 DIAGNOSIS — I4819 Other persistent atrial fibrillation: Secondary | ICD-10-CM

## 2021-05-22 DIAGNOSIS — D571 Sickle-cell disease without crisis: Secondary | ICD-10-CM

## 2021-05-22 DIAGNOSIS — R188 Other ascites: Secondary | ICD-10-CM | POA: Diagnosis not present

## 2021-05-22 DIAGNOSIS — I5022 Chronic systolic (congestive) heart failure: Secondary | ICD-10-CM | POA: Diagnosis not present

## 2021-05-22 DIAGNOSIS — R1084 Generalized abdominal pain: Secondary | ICD-10-CM | POA: Diagnosis not present

## 2021-05-22 DIAGNOSIS — K8050 Calculus of bile duct without cholangitis or cholecystitis without obstruction: Secondary | ICD-10-CM | POA: Diagnosis not present

## 2021-05-22 LAB — CBC WITH DIFFERENTIAL/PLATELET
Abs Immature Granulocytes: 0.1 10*3/uL — ABNORMAL HIGH (ref 0.00–0.07)
Basophils Absolute: 0.1 10*3/uL (ref 0.0–0.1)
Basophils Relative: 2 %
Eosinophils Absolute: 0 10*3/uL (ref 0.0–0.5)
Eosinophils Relative: 0 %
HCT: 21 % — ABNORMAL LOW (ref 39.0–52.0)
Hemoglobin: 7.6 g/dL — ABNORMAL LOW (ref 13.0–17.0)
Lymphocytes Relative: 6 %
Lymphs Abs: 0.4 10*3/uL — ABNORMAL LOW (ref 0.7–4.0)
MCH: 35 pg — ABNORMAL HIGH (ref 26.0–34.0)
MCHC: 36.2 g/dL — ABNORMAL HIGH (ref 30.0–36.0)
MCV: 96.8 fL (ref 80.0–100.0)
Monocytes Absolute: 0.6 10*3/uL (ref 0.1–1.0)
Monocytes Relative: 8 %
Neutro Abs: 6 10*3/uL (ref 1.7–7.7)
Neutrophils Relative %: 83 %
Platelets: 176 10*3/uL (ref 150–400)
Promyelocytes Relative: 1 %
RBC: 2.17 MIL/uL — ABNORMAL LOW (ref 4.22–5.81)
RDW: 24.1 % — ABNORMAL HIGH (ref 11.5–15.5)
WBC: 7.2 10*3/uL (ref 4.0–10.5)
nRBC: 12 /100 WBC — ABNORMAL HIGH
nRBC: 5.6 % — ABNORMAL HIGH (ref 0.0–0.2)

## 2021-05-22 LAB — TYPE AND SCREEN
ABO/RH(D): AB POS
Antibody Screen: NEGATIVE
Unit division: 0
Unit division: 0

## 2021-05-22 LAB — MAGNESIUM: Magnesium: 2.2 mg/dL (ref 1.7–2.4)

## 2021-05-22 LAB — HEPATITIS B SURFACE ANTIBODY, QUANTITATIVE: Hep B S AB Quant (Post): 138.9 m[IU]/mL (ref >9.9)

## 2021-05-22 LAB — COMPREHENSIVE METABOLIC PANEL
ALT: 17 U/L (ref 0–44)
AST: 27 U/L (ref 15–41)
Albumin: 2.1 g/dL — ABNORMAL LOW (ref 3.5–5.0)
Alkaline Phosphatase: 152 U/L — ABNORMAL HIGH (ref 38–126)
Anion gap: 13 (ref 5–15)
BUN: 59 mg/dL — ABNORMAL HIGH (ref 8–23)
CO2: 27 mmol/L (ref 22–32)
Calcium: 8.2 mg/dL — ABNORMAL LOW (ref 8.9–10.3)
Chloride: 95 mmol/L — ABNORMAL LOW (ref 98–111)
Creatinine, Ser: 4.85 mg/dL — ABNORMAL HIGH (ref 0.61–1.24)
GFR, Estimated: 13 mL/min — ABNORMAL LOW (ref >60)
Glucose, Bld: 103 mg/dL — ABNORMAL HIGH (ref 70–99)
Potassium: 3.9 mmol/L (ref 3.5–5.1)
Sodium: 135 mmol/L (ref 135–145)
Total Bilirubin: 2.1 mg/dL — ABNORMAL HIGH (ref 0.3–1.2)
Total Protein: 5.1 g/dL — ABNORMAL LOW (ref 6.5–8.1)

## 2021-05-22 LAB — BPAM RBC
Blood Product Expiration Date: 202212102359
Blood Product Expiration Date: 202212272359
ISSUE DATE / TIME: 202212031702
ISSUE DATE / TIME: 202212041034
Unit Type and Rh: 9500
Unit Type and Rh: 9500

## 2021-05-22 LAB — GLUCOSE, CAPILLARY
Glucose-Capillary: 105 mg/dL — ABNORMAL HIGH (ref 70–99)
Glucose-Capillary: 99 mg/dL (ref 70–99)
Glucose-Capillary: 89 mg/dL (ref 70–99)

## 2021-05-22 LAB — PHOSPHORUS: Phosphorus: 5.8 mg/dL — ABNORMAL HIGH (ref 2.5–4.6)

## 2021-05-22 LAB — PATHOLOGIST SMEAR REVIEW

## 2021-05-22 MED ORDER — MAGNESIUM HYDROXIDE 400 MG/5ML PO SUSP
960.0000 mL | Freq: Once | ORAL | Status: AC
  Administered 2021-05-22: 960 mL via RECTAL
  Filled 2021-05-22: qty 473

## 2021-05-22 MED ORDER — HYDROMORPHONE HCL 1 MG/ML IJ SOLN
1.0000 mg | INTRAMUSCULAR | Status: DC | PRN
  Administered 2021-05-22 – 2021-05-24 (×12): 1 mg via INTRAVENOUS
  Filled 2021-05-22 (×13): qty 1

## 2021-05-22 MED ORDER — AMIODARONE HCL IN DEXTROSE 360-4.14 MG/200ML-% IV SOLN
30.0000 mg/h | INTRAVENOUS | Status: DC
  Administered 2021-05-22 – 2021-05-25 (×6): 30 mg/h via INTRAVENOUS
  Filled 2021-05-22 (×6): qty 200

## 2021-05-22 NOTE — Progress Notes (Signed)
PROGRESS NOTE    Patrick [name]  KGM:[id number] DOB: March 01, 1956 DOA: 05/19/2021 PCP: Dorena Dew, FNP   Brief Narrative:  The patient is a 65 year old African-American male with a past medical history significant for but not limited to ESRD on dialysis Tuesday, Thursdays, Saturday, anemia of chronic kidney disease as well as history of sickle cell with baseline hemoglobin of 6-44, chronic systolic CHF, chronic atrial fibrillation with anticoagulant Eliquis as well as other comorbidities who presented with small bowel obstruction after presenting from his home to the ED complaining of abdominal pain.  He was recently hospitalized at [place] from 05/09/2025 for small bowel obstruction and subsequently resolved with conservative measures in addition to management of choledocholithiasis via ERCP stone removal and stents to the common bile duct as well as the pancreatic duct.  During his previous hospitalization general surgery reportedly recommended cholecystectomy but the patient ultimately refused this measure at that time.  His additional hospital course was notable for acute on chronic anemia with a hemoglobin on the day of admission of 5.7 requiring PRBCs as an swelling hemoglobin improved to follow value of 7.9 at the time of discharge.  Over last few days since being discharged patient has had worsening generalized abdominal discomfort and it is initially intermittent but then became constant and was worse with palpation or attempts to eat or drink anything with that timeframe.  He also notes having new nausea and vomiting last few days is nonbloody and nonbilious emesis along that timeframe with most recent episodes occurring in the ED.  He also noted a decline in his flatus and has had a bowel movement 2 to 3 days.  Normally he takes as needed oxycodone as an outpatient.  He presented the ED and was admitted for small bowel obstruction and a NG tube placed in the ED is attached to low  intermittent wall suction.  In the ED he received fentanyl, Zofran and a normal saline bolus.  Because he has a AICD patient nephrology was consulted for maintenance of dialysis and he underwent dialysis today.  Patient's blood count has been steadily dropping and he had a very large amount of dark bilious drainage from his NG with possible blood in it.  Given his decline in hemoglobin he was placed on a PPI twice daily and GI was called for further evaluation and we will obtain a gastric occult on the contents from his NG.  GI recommends continuing to monitor CBGs and his blood count was on the lower side again this morning.  Typed and screened and transfused 1 unit PRBCs this AM.  General surgery recommending continuing NG tube and if he does not improve that he may need surgical intervention.  Given that his heart rate is uncontrolled still cardiology is consulted for further evaluation recommendations.  Assessment & Plan:   Principal Problem:   SBO (small bowel obstruction) (HCC) Active Problems:   Abdominal pain   Nausea & vomiting   Anemia   End stage renal disease (HCC)   Permanent atrial fibrillation (HCC)   Chronic systolic CHF (congestive heart failure) (HCC)  Small bowel obstruction -His worsening abdominal pain in the last 3 to 4 days associated with nausea, vomiting, decreased flatus production as well as imaging showing high-grade small bowel obstruction in the mid ileum with transition zone and no bowel abscess or perforation. -Recently hospitalized in [place] for small bowel obstruction that resolved with conservative -General surgery consulted for further evaluation and recommending continue NG tube in  place and testing to low intermittent wall suction -Pain control has been changed and will stop his morphine and start hydromorphone -Early mobilization and sure electrolytes are within normal limits.  We will ensure potassium greater than 4 and if magnesium level greater than  2 -NG tube continues to put out but not as much as yesterday.  Is a little hypotensive and tachycardic with A. fib with RVR -Continue supportive care and antiemetics as patient is strict n.p.o. -General surgery consulted and recommending a small bowel protocol and NG tube for decompression; patient has no history of reported surgeries but general surgery is recommending a diagnostic laparoscopically with lysis of adhesions and simultaneous laparoscopic cholecystectomy and performing this in the next few days; general surgery recommends continue NG tube for now and supportive care and repeat a KUB.  They felt like the patient had contrast and stool in his colon but if he does not improve the next 24 to 48 hours he will require surgical intervention -Surgery obtained a KUB today and it showed "Distal tip of nasogastric tube seen in expected position of proximal stomach. Biliary stent is in grossly good position. No abnormal bowel dilatation is noted. Cholelithiasis. Large stool burden is noted."  -They gave the patient a smog enema with little to no results given his stool burden -Surgery feels that his NG and output recorded for 200 cc yesterday and a CT scan the day before yesterday shows dilated small bowel and upper abdominal ascites.  His bilirubin is improving as well as lipase was normal and 30 feel that his major problem is SBO and that he is not better that they think he is going to need a diagnostic laparoscopic surgery -PT OT recommending SNF  End stage renal disease on hemodialysis Tuesday Thursday Saturday Hyponatremia Hyperphosphatemia -Patient underwent dialysis yesterday and nephrology been consulted -He did have hyperkalemia but this was able to be corrected in dialysis -Patient has abdominal ascites as well due to his ESRD and GI recommended no additional recommendations or treatment from their service at this time and they recommended the patient follow-up with his biliary endoscopy  in [facility] for repeat ERCP and stent removal for above -Strict I's and O's and daily weights -Continue to monitor renal function carefully and BUN/creatinine went from 40/5.55 -> 51/5.77 -> 33/3.55 -> 59/4.85  -Further care per nephrology appreciate their assistance -Nephrology feels that that she keep him on his cycle and plan on dialyzing him tomorrow  Acute on chronic anemia in setting of sickle cell anemia and chronic kidney disease -Patient is a baseline hemoglobin/hematocrit of 8-10 -Presented with hemoglobin of 8.7 however has been slowly trending down given his dark bilious fluid question of whether there is blood in -Empirically start PPI twice daily and consulted GI for further recommendations and will obtain gastric occult; GI recommends continuing conservative measures and continue monitoring -He is status post 2 units of PRBCs and hemoglobin/hematocrit trended up to 8.4/20.6 is now 7.6/21.0 -Given Arsnep subcu yesterday -Continue to monitor for signs and symptoms of bleeding; may need more blood transfusion given his sickle cell anemia -Continue to monitor and trend CBC and check INR in the a.m.  Chronic systolic CHF -Echo done in the last year showed EF of 40 to 45% with indeterminate diastolic parameters -Patient has volume managed with dialysis; will try cautious IV fluid hydration with D5 half-normal saline given his n.p.o. status at 50 MLS per hour and will now stop -Hold his home antihypertensives given his n.p.o. status  and conservative management of SBO -Patient is -1.097 L since admission but this is an accurate -Continue monitor and trend strict I's and O's and daily weights -Cardiology was consulted for further evaluation recommendations and they feel that he appears slightly volume up with JVP to his jaw and he is volume managed with HD and takes 80 mg of p.o. torsemide at home given his soft blood pressures but cannot give him p.o. torsemide and his GDMT including  carvedilol and hydralazine Imdur is on hold given his soft blood pressures -Cardiology is repeating echocardiogram here as his last echocardiogram was done 03/08/2021  Chronic atrial fibrillation now with RVR -CHA2DS2-VASc score is 3 -He is chronically anticoagulated with Eliquis and on carvedilol for beta-blockade -Had an echocardiogram last year that showed severely dilated bilateral atria as well as moderate to severe mitral regurgitation -Presented with rate controlled A. fib without evidence of acute ischemic changes -Currently hold his apixaban if he may go to surgery this week and may consider heparin drip but will hold for now given that his blood count is dropping and that he is getting 2 units of PRBCs today -Cardiology was consulted for his persistent atrial fibrillation and they are planning on starting an amnio drip at 30 mg/h for rate control until he can be transitioned back to his p.o. medications and they are holding anticoagulation due to his anemia given has concerns about recurrent bleeding with anticoagulation  COPD Pulmonary hypertension -Patient is a former smoker and quit in 2007 -Continue with his ministry and albuterol inhaler -Continue to monitor for respiratory compromise -SpO2: 97 % -Continue with pulse oximetry maintain O2 saturations greater than 90%  Hyperbilirubinemia -In the setting of sickle cell anemia and hemolysis -Patient's T bili trended up slightly and went from 2.5 and now 2.7 and is further worsened to 3.6 is now improving to 2.1 -Patient is status post 2 units of PRBCs -Continue to monitor and trend and repeat CMP in a.m.  Sickle cell disease -Continue with pain control and transfuse PRBCs as needed -If necessary need oxygen -Currently does not appear to be an acute chest syndrome or pain crisis -Patient follows up with the sickle cell clinic  DVT prophylaxis: SCDs and will hold his anticoagulation given he may go for surgical  intervention Code Status: FULL CODE  Family Communication: No family currently at bedside Disposition Plan: Pending further clinical improvement and improvement of his small bowel obstruction and clearance by specialists  Status is: Inpatient  Remains inpatient appropriate because: Remains n.p.o. and still has no bowel movements and will likely be going for surgical intervention  Consultants:  General Surgery Nephrology Gastroenterology Cardiology  Procedures: NG tube placement  Echocardiogram is being repeated   Antimicrobials:  Anti-infectives (From admission, onward)    None       Subjective: Seen and examined at bedside and he was still complaining of some abdominal pain and chest discomfort slightly.  Does not feel well.  Denies any lightheadedness or dizziness.  No other concerns or complaints at this time and has not been passing any flatus.  Objective: Vitals:   05/22/21 0419 05/22/21 0745 05/22/21 1200 05/22/21 1520  BP: 100/70 112/69 106/74 113/75  Pulse: (!) 103 (!) 116 98   Resp: 19 12 15 20   Temp: (!) 97.4 F (36.3 C) 97.6 F (36.4 C) 97.6 F (36.4 C) 97.6 F (36.4 C)  TempSrc: Axillary Oral  Oral  SpO2: 98% 98% 98% 97%  Weight: 67.3 kg  Height:        Intake/Output Summary (Last 24 hours) at 05/22/2021 1746 Last data filed at 05/22/2021 1321 Gross per 24 hour  Intake --  Output 375 ml  Net -375 ml    Filed Weights   05/20/21 1730 05/21/21 0450 05/22/21 0419  Weight: 67.3 kg 67.3 kg 67.3 kg   Examination: Physical Exam:  Constitutional: Patient is a thin chronically ill-appearing African-American male currently Eyes: Slightly icteric ENMT: External Ears, Nose appear normal. Grossly normal hearing.  NG tube in place Neck: Appears normal, supple, no cervical masses, normal ROM, no appreciable thyromegaly; no appreciable JVD Respiratory: Diminished to auscultation bilaterally, no wheezing, rales, rhonchi or crackles. Normal respiratory  effort and patient is not tachypenic. No accessory muscle use.  Cardiovascular: Irregularly irregular and tachycardic, no murmurs / rubs / gallops. S1 and S2 auscultated.  Has some slight lower extremity edema Abdomen: Soft, non-tender, non-distended. Bowel sounds positive.  GU: Deferred. Musculoskeletal: No clubbing / cyanosis of digits/nails. No joint deformity upper and lower extremities.  Skin: No rashes, lesions, ulcers on limited skin evaluation. No induration; Warm and dry.  Neurologic: CN 2-12 grossly intact with no focal deficits.  Romberg sign and cerebellar reflexes not assessed.  Psychiatric: Normal judgment and insight. Alert and oriented x 3.  Slightly depressed appearing mood and mildly flat affect  Data Reviewed: I have personally reviewed following labs and imaging studies  CBC: Recent Labs  Lab 05/19/21 1445 05/20/21 0249 05/20/21 1420 05/21/21 0248 05/21/21 1540 05/22/21 0252  WBC 8.3 8.0 6.2 7.7  --  7.2  NEUTROABS  --   --   --  5.4  --  6.0  HGB 8.7* 7.6* 6.9* 7.1* 8.4* 7.6*  HCT 24.5* 20.6* 18.8* 20.2* 23.6* 21.0*  MCV 108.9* 105.6* 105.6* 99.5  --  96.8  PLT 215 182 153 172  --  353    Basic Metabolic Panel: Recent Labs  Lab 05/19/21 1445 05/20/21 0249 05/21/21 0248 05/22/21 0252  NA 133* 132* 135 135  K 5.0 5.4* 4.2 3.9  CL 91* 91* 94* 95*  CO2 29 28 30 27   GLUCOSE 108* 108* 105* 103*  BUN 40* 51* 33* 59*  CREATININE 5.55* 5.77* 3.55* 4.85*  CALCIUM 8.7* 8.4* 8.1* 8.2*  MG  --  2.1 1.9 2.2  PHOS  --  6.3* 5.5* 5.8*    GFR: Estimated Creatinine Clearance: 14.8 mL/min (A) (by C-G formula based on SCr of 4.85 mg/dL (H)). Liver Function Tests: Recent Labs  Lab 05/19/21 1445 05/20/21 0249 05/21/21 0248 05/22/21 0252  AST 40 34 35 27  ALT 18 18 18 17   ALKPHOS 118 118 140* 152*  BILITOT 2.5* 2.7* 3.6* 2.1*  PROT 6.5 5.6* 5.3* 5.1*  ALBUMIN 2.8* 2.4* 2.2* 2.1*    Recent Labs  Lab 05/19/21 1445  LIPASE 51    No results for  input(s): AMMONIA in the last 168 hours. Coagulation Profile: Recent Labs  Lab 05/20/21 0249  INR 1.5*    Cardiac Enzymes: No results for input(s): CKTOTAL, CKMB, CKMBINDEX, TROPONINI in the last 168 hours. BNP (last 3 results) No results for input(s): PROBNP in the last 8760 hours. HbA1C: No results for input(s): HGBA1C in the last 72 hours. CBG: Recent Labs  Lab 05/22/21 0743 05/22/21 1158 05/22/21 1551  GLUCAP 105* 99 89   Lipid Profile: No results for input(s): CHOL, HDL, LDLCALC, TRIG, CHOLHDL, LDLDIRECT in the last 72 hours. Thyroid Function Tests: No results for input(s): TSH, T4TOTAL, FREET4,  T3FREE, THYROIDAB in the last 72 hours. Anemia Panel: No results for input(s): VITAMINB12, FOLATE, FERRITIN, TIBC, IRON, RETICCTPCT in the last 72 hours. Sepsis Labs: No results for input(s): PROCALCITON, LATICACIDVEN in the last 168 hours.  Recent Results (from the past 240 hour(s))  Resp Panel by RT-PCR (Flu A&B, Covid) Nasopharyngeal Swab     Status: None   Collection Time: 05/19/21  6:29 PM   Specimen: Nasopharyngeal Swab; Nasopharyngeal(NP) swabs in vial transport medium  Result Value Ref Range Status   SARS Coronavirus 2 by RT PCR NEGATIVE NEGATIVE Final    Comment: (NOTE) SARS-CoV-2 target nucleic acids are NOT DETECTED.  The SARS-CoV-2 RNA is generally detectable in upper respiratory specimens during the acute phase of infection. The lowest concentration of SARS-CoV-2 viral copies this assay can detect is 138 copies/mL. A negative result does not preclude SARS-Cov-2 infection and should not be used as the sole basis for treatment or other patient management decisions. A negative result may occur with  improper specimen collection/handling, submission of specimen other than nasopharyngeal swab, presence of viral mutation(s) within the areas targeted by this assay, and inadequate number of viral copies(<138 copies/mL). A negative result must be combined  with clinical observations, patient history, and epidemiological information. The expected result is Negative.  Fact Sheet for Patients:  EntrepreneurPulse.com.au  Fact Sheet for Healthcare Providers:  IncredibleEmployment.be  This test is no t yet approved or cleared by the [place] FDA and  has been authorized for detection and/or diagnosis of SARS-CoV-2 by FDA under an Emergency Use Authorization (EUA). This EUA will remain  in effect (meaning this test can be used) for the duration of the COVID-19 declaration under Section 564(b)(1) of the Act, 21 U.S.C.section 360bbb-3(b)(1), unless the authorization is terminated  or revoked sooner.       Influenza A by PCR NEGATIVE NEGATIVE Final   Influenza B by PCR NEGATIVE NEGATIVE Final    Comment: (NOTE) The Xpert Xpress SARS-CoV-2/FLU/RSV plus assay is intended as an aid in the diagnosis of influenza from Nasopharyngeal swab specimens and should not be used as a sole basis for treatment. Nasal washings and aspirates are unacceptable for Xpert Xpress SARS-CoV-2/FLU/RSV testing.  Fact Sheet for Patients: EntrepreneurPulse.com.au  Fact Sheet for Healthcare Providers: IncredibleEmployment.be  This test is not yet approved or cleared by the [place] FDA and has been authorized for detection and/or diagnosis of SARS-CoV-2 by FDA under an Emergency Use Authorization (EUA). This EUA will remain in effect (meaning this test can be used) for the duration of the COVID-19 declaration under Section 564(b)(1) of the Act, 21 U.S.C. section 360bbb-3(b)(1), unless the authorization is terminated or revoked.  Performed at [facility], [place] [address], [place], [place] [address]      RN Pressure Injury Documentation:     Estimated body mass index is 20.69 kg/m as calculated from the following:   Height as of this encounter: 5\' 11"  (1.803 m).   Weight  as of this encounter: 67.3 kg.  Malnutrition Type:   Malnutrition Characteristics:   Nutrition Interventions:   Radiology Studies: DG Abd 1 View  Result Date: 05/21/2021 CLINICAL DATA:  Small bowel obstruction. EXAM: ABDOMEN - 1 VIEW COMPARISON:  05/20/2021 FINDINGS: 0857 hours. Relatively gasless abdomen consistent with the diffuse fluid-filled small bowel seen on CT scan earlier today. NG tube tip is in the stomach with proximal side port of the NG tube at or just above the GE junction. Common bile duct stent again noted.  IMPRESSION: Relatively gasless abdomen consistent with diffuse fluid-filled small bowel seen on CT scan earlier today. Proximal side port of the NG tube is at or just above the GE junction. NG tube could be advanced 3-4 cm to ensure side port placement below the GE junction. Electronically Signed   By: Misty Stanley M.D.   On: 05/21/2021 10:51   CT ABDOMEN PELVIS W CONTRAST  Result Date: 05/21/2021 CLINICAL DATA:  Abdominal pain EXAM: CT ABDOMEN AND PELVIS WITH CONTRAST TECHNIQUE: Multidetector CT imaging of the abdomen and pelvis was performed using the standard protocol following bolus administration of intravenous contrast. CONTRAST:  76mL ISOVUE-370 IOPAMIDOL (ISOVUE-370) INJECTION 76% COMPARISON:  05/19/2021 FINDINGS: LOWER CHEST: Moderate cardiomegaly HEPATOBILIARY: There is a common bile duct stent. There are stones and air within the gallbladder. Large amount of upper ascites, increased. Mild hepatic nodularity. PANCREAS: Normal pancreas. Pancreatic stent in unchanged position. No ductal dilatation or peripancreatic fluid collection. SPLEEN: Calcified splenic remnant ADRENALS/URINARY TRACT: The adrenal glands are normal. No hydronephrosis, nephroureterolithiasis or solid renal mass. The urinary bladder is normal for degree of distention STOMACH/BOWEL: There is no hiatal hernia. Normal duodenal course and caliber. There is diffusely dilated small bowel, slightly progressed  from the prior study. The terminal ileum is decompressed. No focal colonic abnormality. Normal appendix. VASCULAR/LYMPHATIC: Normal course and caliber of the major abdominal vessels. No abdominal or pelvic lymphadenopathy. REPRODUCTIVE: Normal prostate size with symmetric seminal vesicles. MUSCULOSKELETAL. Bone marrow changes reflecting sickle cell disease. OTHER: None. IMPRESSION: 1. Slightly progressed, diffusely dilated small bowel with decompressed terminal ileum, consistent with small bowel obstruction. 2. Large amount of upper abdominal ascites, increased from the prior study. 3. Moderate cardiomegaly. Aortic Atherosclerosis (ICD10-I70.0). Electronically Signed   By: Ulyses Jarred M.D.   On: 05/21/2021 01:38   DG Abd Portable 1V  Result Date: 05/22/2021 CLINICAL DATA:  Abdominal pain and distention. EXAM: PORTABLE ABDOMEN - 1 VIEW COMPARISON:  May 21, 2021. FINDINGS: No abnormal bowel dilatation is noted. Biliary stent is noted in grossly good position. Distal tip of nasogastric tube is seen in expected position of proximal stomach. Stool is noted throughout the colon. Cholelithiasis is noted. IMPRESSION: Distal tip of nasogastric tube seen in expected position of proximal stomach. Biliary stent is in grossly good position. No abnormal bowel dilatation is noted. Cholelithiasis. Large stool burden is noted. Electronically Signed   By: Marijo Conception M.D.   On: 05/22/2021 10:16    Scheduled Meds:  Chlorhexidine Gluconate Cloth  6 each Topical Q0600   fluticasone furoate-vilanterol  1 puff Inhalation Daily   And   umeclidinium bromide  1 puff Inhalation Daily   pantoprazole (PROTONIX) IV  40 mg Intravenous Q12H   Continuous Infusions:  amiodarone 30 mg/hr (05/22/21 1645)    LOS: 3 days   Kerney Elbe, DO [facility] PAGER is on [place]  If 7PM-7AM, please contact night-coverage [email or web address]

## 2021-05-22 NOTE — Progress Notes (Addendum)
Daily Rounding Note  05/22/2021, 12:39 PM  LOS: 3 days   SUBJECTIVE:   Chief complaint:   SBO.  Choledocholithiasis w recent ERCP/Sphinct/CBD and PD stenting/stone extractio  Abdominal pain and nausea improved.  NG tube in place.  OBJECTIVE:         Vital signs in last 24 hours:    Temp:  [97.4 F (36.3 C)-98.1 F (36.7 C)] 97.6 F (36.4 C) (12/05 1200) Pulse Rate:  [98-116] 98 (12/05 1200) Resp:  [12-19] 15 (12/05 1200) BP: (100-112)/(69-77) 106/74 (12/05 1200) SpO2:  [97 %-99 %] 98 % (12/05 1200) Weight:  [67.3 kg] 67.3 kg (12/05 0419) Last BM Date:  (PTA) Filed Weights   05/20/21 1730 05/21/21 0450 05/22/21 0419  Weight: 67.3 kg 67.3 kg 67.3 kg   General: Looks chronically ill but currently resting comfortably in the bed. Heart: Tachycardic.  Irregular. Chest: No labored breathing or cough. Abdomen: Soft, mild protuberant/distention.  Not tender.  NG tube in place with dark output. Extremities: No CCE. Neuro/Psych: Cooperative, calm, pleasant.  No signs of confusion.  Moves all 4 limbs, no tremors.  Intake/Output from previous day: 12/04 0701 - 12/05 0700 In: 381.2 [I.V.:66.2; Blood:315] Out: 200 [Emesis/NG output:200]  Intake/Output this shift: Total I/O In: -  Out: 175 [Urine:175]  Lab Results: Recent Labs    05/20/21 1420 05/21/21 0248 05/21/21 1540 05/22/21 0252  WBC 6.2 7.7  --  7.2  HGB 6.9* 7.1* 8.4* 7.6*  HCT 18.8* 20.2* 23.6* 21.0*  PLT 153 172  --  176   BMET Recent Labs    05/20/21 0249 05/21/21 0248 05/22/21 0252  NA 132* 135 135  K 5.4* 4.2 3.9  CL 91* 94* 95*  CO2 28 30 27   GLUCOSE 108* 105* 103*  BUN 51* 33* 59*  CREATININE 5.77* 3.55* 4.85*  CALCIUM 8.4* 8.1* 8.2*   LFT Recent Labs    05/20/21 0249 05/21/21 0248 05/22/21 0252  PROT 5.6* 5.3* 5.1*  ALBUMIN 2.4* 2.2* 2.1*  AST 34 35 27  ALT 18 18 17   ALKPHOS 118 140* 152*  BILITOT 2.7* 3.6* 2.1*    PT/INR Recent Labs    05/20/21 0249  LABPROT 17.7*  INR 1.5*   Hepatitis Panel Recent Labs    05/20/21 1302  HEPBSAG NON REACTIVE    Studies/Results: DG Abd 1 View  Result Date: 05/21/2021 CLINICAL DATA:  Small bowel obstruction. EXAM: ABDOMEN - 1 VIEW COMPARISON:  05/20/2021 FINDINGS: 0857 hours. Relatively gasless abdomen consistent with the diffuse fluid-filled small bowel seen on CT scan earlier today. NG tube tip is in the stomach with proximal side port of the NG tube at or just above the GE junction. Common bile duct stent again noted. IMPRESSION: Relatively gasless abdomen consistent with diffuse fluid-filled small bowel seen on CT scan earlier today. Proximal side port of the NG tube is at or just above the GE junction. NG tube could be advanced 3-4 cm to ensure side port placement below the GE junction. Electronically Signed   By: Misty Stanley M.D.   On: 05/21/2021 10:51   CT ABDOMEN PELVIS W CONTRAST  Result Date: 05/21/2021 CLINICAL DATA:  Abdominal pain EXAM: CT ABDOMEN AND PELVIS WITH CONTRAST TECHNIQUE: Multidetector CT imaging of the abdomen and pelvis was performed using the standard protocol following bolus administration of intravenous contrast. CONTRAST:  3mL ISOVUE-370 IOPAMIDOL (ISOVUE-370) INJECTION 76% COMPARISON:  05/19/2021 FINDINGS: LOWER CHEST: Moderate cardiomegaly HEPATOBILIARY: There is a  common bile duct stent. There are stones and air within the gallbladder. Large amount of upper ascites, increased. Mild hepatic nodularity. PANCREAS: Normal pancreas. Pancreatic stent in unchanged position. No ductal dilatation or peripancreatic fluid collection. SPLEEN: Calcified splenic remnant ADRENALS/URINARY TRACT: The adrenal glands are normal. No hydronephrosis, nephroureterolithiasis or solid renal mass. The urinary bladder is normal for degree of distention STOMACH/BOWEL: There is no hiatal hernia. Normal duodenal course and caliber. There is diffusely dilated small  bowel, slightly progressed from the prior study. The terminal ileum is decompressed. No focal colonic abnormality. Normal appendix. VASCULAR/LYMPHATIC: Normal course and caliber of the major abdominal vessels. No abdominal or pelvic lymphadenopathy. REPRODUCTIVE: Normal prostate size with symmetric seminal vesicles. MUSCULOSKELETAL. Bone marrow changes reflecting sickle cell disease. OTHER: None. IMPRESSION: 1. Slightly progressed, diffusely dilated small bowel with decompressed terminal ileum, consistent with small bowel obstruction. 2. Large amount of upper abdominal ascites, increased from the prior study. 3. Moderate cardiomegaly. Aortic Atherosclerosis (ICD10-I70.0). Electronically Signed   By: Ulyses Jarred M.D.   On: 05/21/2021 01:38   DG Abd Portable 1V  Result Date: 05/22/2021 CLINICAL DATA:  Abdominal pain and distention. EXAM: PORTABLE ABDOMEN - 1 VIEW COMPARISON:  May 21, 2021. FINDINGS: No abnormal bowel dilatation is noted. Biliary stent is noted in grossly good position. Distal tip of nasogastric tube is seen in expected position of proximal stomach. Stool is noted throughout the colon. Cholelithiasis is noted. IMPRESSION: Distal tip of nasogastric tube seen in expected position of proximal stomach. Biliary stent is in grossly good position. No abnormal bowel dilatation is noted. Cholelithiasis. Large stool burden is noted. Electronically Signed   By: Marijo Conception M.D.   On: 05/22/2021 10:16    ASSESMENT:      SBO.  Recurrent, had same during hospitalization 11/22 -05/13/2021 at [facility] in [place] w nonoperative management.  Surgery following NGT in place.       Choledocholithiasis.  11/26 ERCP w sphincterotomy stone extraction and 10 F 5 cm CBD stent, PD stent placement in [place].  Patient declined laparoscopic cholecystectomy.  MDs stephen Deal and Murray Hodgkins.  Plan was repeat ERCP 3 weeks for stent removal.      Anemia, GI bleed.  Sickle cell  disease.  08/04/2020 EGD for hematemesis: Ectopic gastric mucosa in proximal esophagus.  Nonbleeding erosive gastropathy.  Gastric body erythema.  Small hiatal hernia.  Pathology: negative for H. pylori and showed nonspecific gastric antral and oxyntic mucosa without histopathologic changes..  09/2017 EGD/colonoscopy for IDA: Erosive gastritis.  Received 3 PRBCs while in the ICU for anemia Hgb 5.7... 7.9 at discharge during [facility] hospitalization. Protonix 40 mg IV bid in place.  NGT w dark/reddish brown output.  2 units PRBC 12/3 -12/4. Hgb 6.9 ... 7.6.  aranesp in place.       Large volume upper abdominal ascites.  CT currently showing mild hepatic nodularity, but no call of cirrhosis per radiology  ESRD.  TTS HD schedule.  CHF.  A. fib, on Eliquis.  Recent acute on chronic CHF.  MV mass.    Pulmonary hypertension.  Blindness due to macular degeneration.   PLAN     ERCP w stent removal within 5 weeks, not urgent need at present.  Ex lap, cholecystectomy per general surgery.      Azucena Freed  05/22/2021, 12:39 PM Phone [phone]   I have discussed the case with the APP, and that is the plan I formulated. I personally interviewed and examined  the patient.  CC: Small bowel obstruction Recent choledocholithiasis, ERCP at outside hospital with sphincterotomy and stent placement. Abdominal ascites, likely due to ESRD.  Surgical input appreciated.  Patient has ongoing high-grade SBO.  If does not improve very soon, surgery plans to take him for operative intervention.  Continue NG tube drainage in the meantime, has large volume bilious drainage.  Anemia chronic renal disease and acute illness.  No additional recommendations or treatments from our service at this time.  Signing off, call as needed.  Patient needs to follow-up with his biliary endoscopist in [place] for repeat ERCP and stent removal as noted above.    Nelida Meuse III Office: [phone]

## 2021-05-22 NOTE — Evaluation (Signed)
Occupational Therapy Evaluation Patient Details Name: Patrick [name] MRN: [id number] DOB: 1956-01-13 Today's Date: 05/22/2021   History of Present Illness Patrick [name] is a 65 y.o. male who presented on 12/2 with small bowel obstruction. Pt recently hospitalized 11/22-11/26 at [facility] in [place] for ame reason taht resolved with conservative measures and stone removal/stent placement to common bile duct and pancreatic duct. Pt declined cholecystectomy. PMH: ESRD  on HD, anemia of CKD, CHF, a fib   Clinical Impression   PTA, pt lives alone in second floor apartment and reports typically able to complete ADLs, household IADLs and mobility without AD. Pt has assistance with transportation and grocery shopping due to baseline low vision. Pt presents now with deficits in pain and endurance but able to demo ADLs and basic transfers with min guard at most. Pt limited in further mobility due to a fib with HR up to 152bpm. HR decreases to 120bpm at rest. Anticipate pt to progress well towards independence as pain more controlled. Anticipate no OT needs at DC but will continue to follow acutely.      Recommendations for follow up therapy are one component of a multi-disciplinary discharge planning process, led by the attending physician.  Recommendations may be updated based on patient status, additional functional criteria and insurance authorization.   Follow Up Recommendations  No OT follow up    Assistance Recommended at Discharge PRN  Functional Status Assessment  Patient has had a recent decline in their functional status and demonstrates the ability to make significant improvements in function in a reasonable and predictable amount of time.  Equipment Recommendations  Other (comment) (TBD; pending progress)    Recommendations for Other Services       Precautions / Restrictions Precautions Precautions: Fall;Other (comment) Precaution Comments: blind, NGT, watch HR (a  fib) Restrictions Weight Bearing Restrictions: No      Mobility Bed Mobility Overal bed mobility: Needs Assistance Bed Mobility: Rolling;Sidelying to Sit Rolling: Supervision Sidelying to sit: Supervision       General bed mobility comments: cues for log rolling for pain mgmt    Transfers Overall transfer level: Needs assistance Equipment used: None Transfers: Sit to/from Stand Sit to Stand: Min guard                  Balance Overall balance assessment: Needs assistance Sitting-balance support: No upper extremity supported;Feet supported Sitting balance-Leahy Scale: Good     Standing balance support: No upper extremity supported;During functional activity Standing balance-Leahy Scale: Fair Standing balance comment: fair static standing without AD                           ADL either performed or assessed with clinical judgement   ADL Overall ADL's : Needs assistance/impaired Eating/Feeding: NPO   Grooming: Supervision/safety;Sitting   Upper Body Bathing: Supervision/ safety;Sitting   Lower Body Bathing: Supervison/ safety;Sit to/from stand Lower Body Bathing Details (indicate cue type and reason): sititng/standing EOB without AD Upper Body Dressing : Supervision/safety;Sitting Upper Body Dressing Details (indicate cue type and reason): assist for lines due to low vision Lower Body Dressing: Sit to/from stand;Supervision/safety   Toilet Transfer: Min guard;Stand-pivot   Toileting- Clothing Manipulation and Hygiene: Sit to/from stand;Supervision/safety         General ADL Comments: Pt close to baseline, min guard at most for safety and cues due to blindness and in new environment. despite pain, able to move well; limited by a fib with  HRup to 152     Vision Baseline Vision/History: 2 Legally blind Ability to See in Adequate Light: 4 Severely impaired Patient Visual Report: No change from baseline Vision Assessment?: No apparent visual  deficits     Perception     Praxis      Pertinent Vitals/Pain Pain Assessment: Faces Faces Pain Scale: Hurts even more Pain Location: abdomen Pain Descriptors / Indicators: Grimacing;Guarding Pain Intervention(s): Limited activity within patient's tolerance;Monitored during session     Hand Dominance Right   Extremity/Trunk Assessment Upper Extremity Assessment Upper Extremity Assessment: Overall WFL for tasks assessed   Lower Extremity Assessment Lower Extremity Assessment: Defer to PT evaluation   Cervical / Trunk Assessment Cervical / Trunk Assessment: Normal   Communication Communication Communication: No difficulties   Cognition Arousal/Alertness: Awake/alert Behavior During Therapy: Flat affect Overall Cognitive Status: Within Functional Limits for tasks assessed                                       General Comments  PT and MD entering at end of OT session, notified of HR and a fib with MD suggesting 130bpm parameter for activities as well as medication adjustment    Exercises     Shoulder Instructions      Home Living Family/patient expects to be discharged to:: Private residence Living Arrangements: Alone Available Help at Discharge: Family;Available PRN/intermittently Type of Home: Apartment Home Access: Stairs to enter Entrance Stairs-Number of Steps: flight Entrance Stairs-Rails: Right;Left Home Layout: One level     Bathroom Shower/Tub: Occupational psychologist: Handicapped height     Home Equipment: None          Prior Functioning/Environment Prior Level of Function : Needs assist       Physical Assist : ADLs (physical)   ADLs (physical): IADLs Mobility Comments: no use of AD for mobility, denies recent falls ADLs Comments: Modified Independent with ADLs, able to make simple meals, laundry in apartment. Cousin takes pt grocery shopping, uses [employer] for transportation        OT Problem List: Decreased  activity tolerance;Impaired balance (sitting and/or standing);Impaired vision/perception;Pain      OT Treatment/Interventions: Self-care/ADL training;Therapeutic exercise;Energy conservation;DME and/or AE instruction;Therapeutic activities;Patient/family education;Balance training    OT Goals(Current goals can be found in the care plan section) Acute Rehab OT Goals Patient Stated Goal: pain control OT Goal Formulation: With patient Time For Goal Achievement: 06/05/21 Potential to Achieve Goals: Good  OT Frequency: Min 2X/week   Barriers to D/C:            Co-evaluation              AM-PAC OT "6 Clicks" Daily Activity     Outcome Measure Help from another person eating meals?: Total (NPO) Help from another person taking care of personal grooming?: A Little Help from another person toileting, which includes using toliet, bedpan, or urinal?: A Little Help from another person bathing (including washing, rinsing, drying)?: A Little Help from another person to put on and taking off regular upper body clothing?: A Little Help from another person to put on and taking off regular lower body clothing?: A Little 6 Click Score: 16   End of Session Nurse Communication: Mobility status;Other (comment) (HR)  Activity Tolerance: Treatment limited secondary to medical complications (Comment) (a fib) Patient left: in bed;with call bell/phone within reach;Other (comment) (with PT and MD at  bedside)  OT Visit Diagnosis: Other abnormalities of gait and mobility (R26.89);Pain Pain - part of body:  (abdomen)                Time: 1104-1130 OT Time Calculation (min): 26 min Charges:  OT General Charges $OT Visit: 1 Visit OT Evaluation $OT Eval Moderate Complexity: 1 Mod OT Treatments $Self Care/Home Management : 8-22 mins Malachy Chamber, OTR/L Acute Rehab Services Office: [phone]   Layla Maw 05/22/2021, 11:46 AM

## 2021-05-22 NOTE — Progress Notes (Signed)
Difficulty tolerating SMOG enema-possible vasovagal episode. Very minimal outpt. Dr. Alfredia Ferguson with [facility] updated

## 2021-05-22 NOTE — Progress Notes (Addendum)
Subjective: Seen in room, discomfort slightly better than yesterday asking about "when to get tube out", no dyspnea or chest pain.  BP low stable this a.m. Objective Vital signs in last 24 hours: Vitals:   05/21/21 1340 05/21/21 1524 05/21/21 2002 05/22/21 0419  BP: 107/74 109/77 106/70 100/70  Pulse: (!) 110 (!) 116 (!) 114 (!) 103  Resp: 16  18 19   Temp: (!) 97.5 F (36.4 C)  98.1 F (36.7 C) (!) 97.4 F (36.3 C)  TempSrc: Axillary  Axillary Axillary  SpO2:  97% 99% 98%  Weight:    67.3 kg  Height:       Weight change: 0.1 kg  Physical Exam: General: Alert chronically ill appearing male NAD NG tube in place Heart: Tacky regular, no MRG Lungs: CTA nonlabored breathing Abdomen: Distended, decreased bowel sounds minimally tender all over Extremities: No pedal edema  dialysis Access: Positive bruit RUA AV fistula   Dialysis Orders: TTS at AF 4hr, 400/500, EDW 65kg, 2K/2Ca, UFP #2, AVF, no heparin - Sensipar 30mg  PO q HD - Venofer 50mg  IV weekly - Mircera 234mcg IV q 2 weeks (last 11/8)   Problem/Plan;   SBO, recent gallstone with CBD/pancreatic duct stenting: Plan Per admit and surgery team -NG decompression, ?  Today exploratory laparoscopy/cholecystectomy .  ESRD:  Continue HD TTS schedule -tomorrow on schedule No heparin.  K3.9  Hypertension/volume: BP low sided, no Vol. excess on exam.  No UF with HD  Anemia: Hgb 7.6 <8.4 //transfused 1U PRBCs prior 6.9  Aranesp missed with HD 12/03 -> given subcu 12/04.  Metabolic bone disease: Ca/Phos ok - resume binders when cleared to eat.  Nutrition: Has NG T, will order supplements when cleared to eat.  A-fib: On Eliquis, per admit on hold for possible surgery.   Ernest Haber, PA-C [facility] Beeper [phone] 05/22/2021,7:58 AM  LOS: 3 days   Labs: Basic Metabolic Panel: Recent Labs  Lab 05/20/21 0249 05/21/21 0248 05/22/21 0252  NA 132* 135 135  K 5.4* 4.2 3.9  CL 91* 94* 95*  CO2 28 30 27    GLUCOSE 108* 105* 103*  BUN 51* 33* 59*  CREATININE 5.77* 3.55* 4.85*  CALCIUM 8.4* 8.1* 8.2*  PHOS 6.3* 5.5* 5.8*   Liver Function Tests: Recent Labs  Lab 05/20/21 0249 05/21/21 0248 05/22/21 0252  AST 34 35 27  ALT 18 18 17   ALKPHOS 118 140* 152*  BILITOT 2.7* 3.6* 2.1*  PROT 5.6* 5.3* 5.1*  ALBUMIN 2.4* 2.2* 2.1*   Recent Labs  Lab 05/19/21 1445  LIPASE 51   No results for input(s): AMMONIA in the last 168 hours. CBC: Recent Labs  Lab 05/19/21 1445 05/20/21 0249 05/20/21 1420 05/21/21 0248 05/21/21 1540 05/22/21 0252  WBC 8.3 8.0 6.2 7.7  --  7.2  NEUTROABS  --   --   --  5.4  --  6.0  HGB 8.7* 7.6* 6.9* 7.1* 8.4* 7.6*  HCT 24.5* 20.6* 18.8* 20.2* 23.6* 21.0*  MCV 108.9* 105.6* 105.6* 99.5  --  96.8  PLT 215 182 153 172  --  176   Cardiac Enzymes: No results for input(s): CKTOTAL, CKMB, CKMBINDEX, TROPONINI in the last 168 hours. CBG: Recent Labs  Lab 05/22/21 0743  GLUCAP 105*     Medications:   Chlorhexidine Gluconate Cloth  6 each Topical Q0600   fluticasone furoate-vilanterol  1 puff Inhalation Daily   And   umeclidinium bromide  1 puff Inhalation Daily   pantoprazole (PROTONIX) IV  40 mg Intravenous Q12H

## 2021-05-22 NOTE — Evaluation (Signed)
Physical Therapy Evaluation Patient Details Name: Patrick [name] MRN: [id number] DOB: Jul 25, 1955 Today's Date: 05/22/2021  History of Present Illness  65 y.o. male presented on 12/2 with small bowel obstruction. Pt recently hospitalized 11/22-11/26 at [facility] in [place] for ame reason taht resolved with conservative measures and stone removal/stent placement to common bile duct and pancreatic duct. Pt declined cholecystectomy. PMH: ESRD  on HD, anemia of CKD, CHF, a fib  Clinical Impression  Pt was seen for mobility on side of bed but was unable to progress further due to his HR being quite high, over 150 with pt reporting mild symptoms.  DO was in and set a HR limit at 130 bpm, so returned him to bed and did strength and ROM assessments on LE's there.  Pt is motivated to get home but will need HR control to get to his safe mobility limit including testing stairs.   Pt is quite comfortable moving at home due to familiarity and does not need an AD for support typically.  Follow for acute PT goals with HR limit of 130, with instruction for safety and control of balance in this unfamiliar environment.       Recommendations for follow up therapy are one component of a multi-disciplinary discharge planning process, led by the attending physician.  Recommendations may be updated based on patient status, additional functional criteria and insurance authorization.  Follow Up Recommendations Skilled nursing-short term rehab (<3 hours/day)    Assistance Recommended at Discharge Frequent or constant Supervision/Assistance  Functional Status Assessment Patient has had a recent decline in their functional status and demonstrates the ability to make significant improvements in function in a reasonable and predictable amount of time.  Equipment Recommendations  None recommended by PT    Recommendations for Other Services       Precautions / Restrictions Precautions Precautions: Fall;Other  (comment) Precaution Comments: blind, NGT, watch HR (a fib) Restrictions Weight Bearing Restrictions: No      Mobility  Bed Mobility Overal bed mobility: Needs Assistance Bed Mobility: Sit to Supine       Sit to supine: Min assist   General bed mobility comments: min assist to cue body mechanics and lightly assist legs    Transfers Overall transfer level: Needs assistance                 General transfer comment: did not stand up due to HR being variable and high, up to 153 for initial part of session and then when finally back to bed 113    Ambulation/Gait               General Gait Details: withheld due to HR  Stairs            Wheelchair Mobility    Modified Rankin (Stroke Patients Only)       Balance Overall balance assessment: Needs assistance Sitting-balance support: Feet supported Sitting balance-Leahy Scale: Good Sitting balance - Comments: good balance with no AD       Standing balance comment: withheld over HR heights, MD in and asked for limit of 130                             Pertinent Vitals/Pain Pain Assessment: Faces Faces Pain Scale: Hurts even more Pain Location: abdomen Pain Descriptors / Indicators: Grimacing;Guarding;Aching Pain Intervention(s): Limited activity within patient's tolerance;Monitored during session;Premedicated before session;Repositioned    Home Living Family/patient expects to be  discharged to:: Private residence Living Arrangements: Alone Available Help at Discharge: Family;Available PRN/intermittently Type of Home: Apartment Home Access: Stairs to enter Entrance Stairs-Rails: Psychiatric nurse of Steps: flight   Home Layout: One level Home Equipment: None Additional Comments: pt is clear he wants to go home if at all possible    Prior Function Prior Level of Function : Needs assist       Physical Assist : Mobility (physical) Mobility (physical):  (no assist  needed, enters apt on stairs)   Mobility Comments: no use of AD for mobility, denies recent falls       Hand Dominance   Dominant Hand: Right    Extremity/Trunk Assessment   Upper Extremity Assessment Upper Extremity Assessment: Defer to OT evaluation    Lower Extremity Assessment Lower Extremity Assessment: Generalized weakness    Cervical / Trunk Assessment Cervical / Trunk Assessment: Normal  Communication   Communication: No difficulties  Cognition Arousal/Alertness: Awake/alert Behavior During Therapy: Flat affect Overall Cognitive Status: Within Functional Limits for tasks assessed                                 General Comments: contributes to all history information        General Comments General comments (skin integrity, edema, etc.): MD during PT session and after stated 130 bpm was limit for HR    Exercises     Assessment/Plan    PT Assessment Patient needs continued PT services  PT Problem List Decreased strength;Decreased activity tolerance;Decreased balance;Decreased safety awareness;Cardiopulmonary status limiting activity       PT Treatment Interventions DME instruction;Gait training;Stair training;Functional mobility training;Therapeutic activities;Therapeutic exercise;Balance training;Neuromuscular re-education;Patient/family education    PT Goals (Current goals can be found in the Care Plan section)  Acute Rehab PT Goals Patient Stated Goal: to get to his home PT Goal Formulation: With patient Time For Goal Achievement: 06/05/21 Potential to Achieve Goals: Good    Frequency Min 3X/week   Barriers to discharge Decreased caregiver support;Inaccessible home environment home with flight to enter apt and lives alone    Co-evaluation               AM-PAC PT "6 Clicks" Mobility  Outcome Measure Help needed turning from your back to your side while in a flat bed without using bedrails?: A Little Help needed moving from  lying on your back to sitting on the side of a flat bed without using bedrails?: A Little Help needed moving to and from a bed to a chair (including a wheelchair)?: A Little Help needed standing up from a chair using your arms (e.g., wheelchair or bedside chair)?: A Little Help needed to walk in hospital room?: A Little Help needed climbing 3-5 steps with a railing? : A Little 6 Click Score: 18    End of Session Equipment Utilized During Treatment: Gait belt Activity Tolerance: Patient limited by fatigue;Treatment limited secondary to medical complications (Comment) Patient left: in bed;with call bell/phone within reach;with bed alarm set;with nursing/sitter in room Nurse Communication: Mobility status;Precautions PT Visit Diagnosis: Muscle weakness (generalized) (M62.81);Difficulty in walking, not elsewhere classified (R26.2)    Time: 0962-8366 PT Time Calculation (min) (ACUTE ONLY): 18 min   Charges:   PT Evaluation $PT Eval Moderate Complexity: 1 Mod         Ramond Dial 05/22/2021, 4:54 PM

## 2021-05-22 NOTE — Consult Note (Addendum)
Advanced Heart Failure Team Consult Note   Primary Physician: Dorena Dew, FNP HF Cardiologist: Dr. Haroldine Laws  Reason for Consultation: Preoperative evaluation, AF with RVR  HPI:    Patrick [name] is seen today for evaluation of AF with RVR and preoperative evaluation at the request of Dr. Alfredia Ferguson.   Patrick [name] is a 65 y.o. male  with a history of sickle cell anemia, systolic HF due to NICM EF 40% 11/2012 initially diagnosed 05/2011, no CAD by cath 05/2011, ESRD on HD, legally blind, and tobacco abuse.    Admitted 7/6 through 10/12/621 with A/C systolic heart failure and CKD in setting of new A fib. Started coumadin. Discharge weight was 135 pounds.    Admitted 4/3 - 09/24/17 with upper GI bleed and profound anemia with Hgb of 4.6 with Sickle Cell. Transfused with RBCs. EGD showed nonbleeding gastritis, biopsies were obtained. Colonoscopy showed small internal hemorrhoids, otherwise normal.   Admitted 76/2831 with A/C systolic HF and sickle cell anemia. Diuresed with IV lasix and transitioned to torsemide 100 mg daily. Found to be back in Afib.  He was started on amio drip and underwent DCCV 12/19. Amio transitioned to PO. He required 1 uPRBCs for anemia. He also had chest pain with negative troponin and no EKG changes. Myoview showed no significant ischemia, EF 46%    Started iHD in 05/2019    Admitted 5/17 for systolic HF w/ fluid overload. He had been compliant w/ iHD but acute exacerbation related to dietary indiscretion. He was admitted and underwent an extra HD session and nephrology reduced his EDW goal. EKG showed rate controlled afib. 2D echo with LVEF 35-40%, low normal RV function,severe pulm HTN in setting of volume overload with RVSP 5mmHG. Severe LAE, mod RAE, mod MR, severe TR. RV TAPSE 1.6, RV s' 10.   On  10/06/19 he had DC-CV with restoration of NSR. Found to have recurrent AFL in 5/21. Referred to AF Clinic. Felt that patient was not good candidate to  restart amiodarone. Rate control strategy continued.   Echo 09/21 EF 40-45%, RV mildly reduced, RVSP 75 mmHg, severe TR  Admitted with upper GI bleed 02/22. Transfused with 1 u pRBCs. EGD with a few small erosions with no recent bleeding in prepyloric area of stomach and patchy mildly erythematous mucosa in gastric body without bleeding.   Admitted to [facility] in [place] 11/22-11/26/22 with SBO treated with conservative management and choledocholithiasis s/p ERCP stone removal + stents to common bile duct and pancreatic duct. Patient declined cholecystectomy. Course complicated by acute on chronic anemia. Hemoglobin down to 5.7 and transfused with pRBCs. Hgb 7.9 at discharge.   Patient was subsequently admitted to [facility] on 05/19/21 with recurrent SBO. General Surgery and GI seeing, depending on his progress may go for surgery tomorrow. Has NG tube.  CT A/P 12/04 demonstrated SBO and large upper abdominal ascites, mild hepatic nodularity. Abdominal Xray with large stool burden. Anticoagulation currently on hold d/t a/c anemia. Hgb down to 6.9 this admit, has received 2 u PRBCs. Hgb 7.6 this am. Has been in AF with rates 110s-110s at rest, while at bedside this afternoon rate up to 130s-140s with minimal activity.   Reports ongoing abdominal discomfort, although slightly improved. Not passing gas, no bowel movement. Feels thirsty, wants fluids.  Review of Systems: [y] = yes, [ ]  = no   General: Weight gain [ ] ; Weight loss [Y]; Anorexia [ ] ; Fatigue [ ] ; Fever [ ] ; Chills [ ] ;  Weakness [ ]   Cardiac: Chest pain/pressure [ ] ; Resting SOB [ ] ; Exertional SOB [ ] ; Orthopnea [ ] ; Pedal Edema [ ] ; Palpitations [ ] ; Syncope [ ] ; Presyncope [ ] ; Paroxysmal nocturnal dyspnea[ ]   Pulmonary: Cough [ ] ; Wheezing[ ] ; Hemoptysis[ ] ; Sputum [ ] ; Snoring [ ]   GI: Vomiting[ ] ; Dysphagia[ ] ; Melena[ ] ; Hematochezia [ ] ; Heartburn[ ] ; Abdominal pain [Y]; Constipation [ ] ; Diarrhea [ ] ; BRBPR [ ]   GU:  Hematuria[ ] ; Dysuria [ ] ; Nocturia[ ]   Vascular: Pain in legs with walking [ ] ; Pain in feet with lying flat [ ] ; Non-healing sores [ ] ; Stroke [ ] ; TIA [ ] ; Slurred speech [ ] ;  Neuro: Headaches[ ] ; Vertigo[ ] ; Seizures[ ] ; Paresthesias[ ] ;Blurred vision [ ] ; Diplopia [ ] ; Vision changes [ ]   Ortho/Skin: Arthritis [ ] ; Joint pain [ ] ; Muscle pain [ ] ; Joint swelling [ ] ; Back Pain [ ] ; Rash [ ]   Psych: Depression[ ] ; Anxiety[ ]   Heme: Bleeding problems [Y]; Clotting disorders [ ] ; Anemia [Y]  Endocrine: Diabetes [ ] ; Thyroid dysfunction[ ]   Home Medications Prior to Admission medications   Medication Sig Start Date End Date Taking? Authorizing Provider  acetaminophen (TYLENOL) 500 MG tablet Take 1,000 mg by mouth every 6 (six) hours as needed for moderate pain or headache.   Yes [provider]  allopurinol (ZYLOPRIM) 100 MG tablet TAKE 1 TABLET BY MOUTH EVERY DAY 05/17/21  Yes Dorena Dew, FNP  AURYXIA 1 GM 210 MG(Fe) tablet Take 420 mg by mouth 3 (three) times daily. 02/11/21  Yes [provider]  diphenhydramine-acetaminophen (TYLENOL PM) 25-500 MG TABS tablet Take 2 tablets by mouth at bedtime as needed (sleep).   Yes [provider]  ELIQUIS 5 MG TABS tablet TAKE 1 TABLET BY MOUTH TWICE A DAY 02/21/21  Yes Jakolby Sedivy, Shaune Pascal, MD  folic acid (FOLVITE) 1 MG tablet TAKE 1 TABLET BY MOUTH EVERY DAY 02/22/21  Yes Dorena Dew, FNP  gabapentin (NEURONTIN) 100 MG capsule TAKE 1 CAPSULE BY MOUTH EVERYDAY AT BEDTIME 12/12/20  Yes Dorena Dew, FNP  hydrALAZINE (APRESOLINE) 25 MG tablet Take 1 tablet (25 mg total) by mouth in the morning and at bedtime. DO NOT TAKE AM DOSES ON DIALYSIS DAYS Patient taking differently: Take 25 mg by mouth See admin instructions. Take 1 tablet by mouth twice daily all days, EXCEPT on dialysis day (T, Th, Sat) take in the evening only 01/27/20 05/19/21 Yes Bronx Brogden, Shaune Pascal, MD  hydroxyurea (HYDREA) 500 MG capsule Take 1 capsule  (500 mg total) by mouth daily. TAKE 1 CAPSULE BY MOUTH EVERY DAY WITH FOOD TO MINIMIZE GI SIDE EFFECTS 01/31/21  Yes Dorena Dew, FNP  isosorbide mononitrate (IMDUR) 30 MG 24 hr tablet Take 1 tablet (30 mg total) by mouth daily. DO NOT TAKE AM ON DIALYSIS DAYS. 08/25/20  Yes Wilhelmina Hark, Shaune Pascal, MD  lidocaine-prilocaine (EMLA) cream Apply 1 application topically daily as needed (port access).  09/08/19  Yes [provider]  metolazone (ZAROXOLYN) 5 MG tablet Take 1 tablet (5 mg total) by mouth daily as needed. Patient taking differently: Take 5 mg by mouth daily as needed (fluid). 05/23/20  Yes Larken Urias, Shaune Pascal, MD  Oxycodone HCl 10 MG TABS Take 1 tablet (10 mg total) by mouth every 4 (four) hours as needed (pain). 05/17/21  Yes Dorena Dew, FNP  tiZANidine (ZANAFLEX) 4 MG tablet TAKE 0.5 TABLET BY MOUTH EVERY 8 HOURS AS NEEDED FOR MUSCLE  SPASMS Patient taking differently: Take 2 mg by mouth every 8 (eight) hours as needed for muscle spasms. 02/22/21  Yes Dorena Dew, FNP  torsemide (DEMADEX) 20 MG tablet Take 80 mg by mouth daily. 03/31/20  Yes [provider]  Vitamin D, Ergocalciferol, (DRISDOL) 1.25 MG (50000 UNIT) CAPS capsule TAKE 1 CAPSULE BY MOUTH ONE TIME PER WEEK Patient taking differently: Take 50,000 Units by mouth every Saturday. 05/15/21  Yes Dorena Dew, FNP  albuterol (VENTOLIN HFA) 108 (90 Base) MCG/ACT inhaler Inhale 2 puffs into the lungs every 6 (six) hours as needed for wheezing or shortness of breath. Patient not taking: Reported on 05/19/2021 05/09/20   Freddi Starr, MD  Budeson-Glycopyrrol-Formoterol (BREZTRI AEROSPHERE) 160-9-4.8 MCG/ACT AERO Inhale 2 puffs into the lungs in the morning and at bedtime. Patient not taking: Reported on 05/19/2021 12/21/20   Freddi Starr, MD  carvedilol (COREG) 12.5 MG tablet Take 1 tablet (12.5 mg total) by mouth 2 (two) times daily with a meal. Patient not taking: Reported on 05/19/2021 05/16/21    Naome Brigandi, Shaune Pascal, MD    Past Medical History: Past Medical History:  Diagnosis Date   Blood dyscrasia    Blood transfusion    "I've had a bunch of them"   CHF (congestive heart failure) ([place])    followed by hf clinic   Chronic kidney disease (CKD), stage III (moderate) ([place])    Dialysis patient ([place]) 04/2019   AV fistula (right arm)   Gout    Headache(784.0)    "probably weekly" (01/13/2014)   Legally blind    Shortness of breath dyspnea    Sickle cell disease, type SS (HCC)    Swelling abdomen    Swelling of extremity, left    Swelling of extremity, right     Past Surgical History: Past Surgical History:  Procedure Laterality Date   A/V FISTULAGRAM Right 07/29/2020   Procedure: A/V FISTULAGRAM - Right Arm;  Surgeon: Angelia Mould, MD;  Location: [facility];  Service: Cardiovascular;  Laterality: Right;   BASCILIC VEIN TRANSPOSITION Right 04/12/2014   Procedure: BASILIC VEIN TRANSPOSITION;  Surgeon: Elam Dutch, MD;  Location: [place];  Service: Vascular;  Laterality: Right;   BIOPSY  08/04/2020   Procedure: BIOPSY;  Surgeon: Ladene Artist, MD;  Location: [facility] ENDOSCOPY;  Service: Gastroenterology;;   CARDIAC CATHETERIZATION  05/2011   CARDIOVERSION N/A 06/05/2018   Procedure: CARDIOVERSION;  Surgeon: Jolaine Artist, MD;  Location: [place];  Service: Cardiovascular;  Laterality: N/A;   CARDIOVERSION N/A 10/06/2019   Procedure: CARDIOVERSION;  Surgeon: Jolaine Artist, MD;  Location: [place];  Service: Cardiovascular;  Laterality: N/A;   COLONOSCOPY WITH PROPOFOL N/A 09/24/2017   Procedure: COLONOSCOPY WITH PROPOFOL;  Surgeon: Jackquline Denmark, MD;  Location: [place];  Service: Endoscopy;  Laterality: N/A;   ESOPHAGOGASTRODUODENOSCOPY N/A 09/19/2017   Procedure: ESOPHAGOGASTRODUODENOSCOPY (EGD);  Surgeon: Jackquline Denmark, MD;  Location[facility] END[facility]COPY;  Service: Endoscopy;  Laterality: N/A;   ESOPHAGOGASTRODUODENOSCOPY (EGD) WITH PROPOFOL N/A  08/04/2020   Procedure: ESOPHAGOGASTRODUODENOSCOPY (EGD) WITH PROPOFOL;  Surgeon: Ladene Artist, MD;  Location: [place];  Service: Gastroenterology;  Laterality: N/A;   LEFT AND RIGHT HEART CATHETERIZATION WITH CORONARY ANGIOGRAM N/A 06/11/2011   Procedure: LEFT AND RIGHT HEART CATHETERIZATION WITH CORONARY ANGIOGRAM;  Surgeon: Larey Dresser, MD;  Location: [facility] CATH LAB;  Service: Cardiovascular;  Laterality: N/A;   PERIPHERAL VASCULAR BALLOON ANGIOPLASTY Right 07/29/2020   Procedure: PERIPHERAL VASCULAR BALLOON ANGIOPLASTY;  Surgeon: Angelia Mould, MD;  Location: [place] [facility];  Service: Cardiovascular;  Laterality: Right;  arm fistula   REVISON OF ARTERIOVENOUS FISTULA Right 08/08/2020   Procedure: ANEURYSM EXCISION OF RIGHT UPPER EXTREMITY ARTERIOVENOUS FISTULA;  Surgeon: Angelia Mould, MD;  Location: [place];  Service: Vascular;  Laterality: Right;   TEE WITHOUT CARDIOVERSION N/A 12/27/2015   Procedure: TRANSESOPHAGEAL ECHOCARDIOGRAM (TEE);  Surgeon: Sueanne Margarita, MD;  Location: [facility] ENDOSCOPY;  Service: Cardiovascular;  Laterality: N/A;    Family History: Family History  Problem Relation Age of Onset   Alcohol abuse Mother    Liver disease Mother    Cirrhosis Mother    Heart attack Mother     Social History: Social History   Socioeconomic History   Marital status: Divorced    Spouse name: Not on file   Number of children: Not on file   Years of education: Not on file   Highest education level: Not on file  Occupational History   Not on file  Tobacco Use   Smoking status: Former    Packs/day: 0.25    Years: 41.00    Pack years: 10.25    Types: Cigarettes    Quit date: 12/08/2015    Years since quitting: 5.4   Smokeless tobacco: Never  Vaping Use   Vaping Use: Never used  Substance and Sexual Activity   Alcohol use: Not Currently    Alcohol/week: 3.0 standard drinks    Types: 1 Glasses of wine, 2 Cans of beer per week    Comment: occasional    Drug use: No   Sexual activity: Not Currently  Other Topics Concern   Not on file  Social History Narrative   Not on file   Social Determinants of Health   Financial Resource Strain: Not on file  Food Insecurity: Not on file  Transportation Needs: Not on file  Physical Activity: Not on file  Stress: Not on file  Social Connections: Not on file    Allergies:  No Known Allergies  Objective:    Vital Signs:   Temp:  [97.4 F (36.3 C)-98.1 F (36.7 C)] 97.6 F (36.4 C) (12/05 1200) Pulse Rate:  [98-116] 98 (12/05 1200) Resp:  [12-19] 15 (12/05 1200) BP: (100-112)/(69-77) 106/74 (12/05 1200) SpO2:  [97 %-99 %] 98 % (12/05 1200) Weight:  [67.3 kg] 67.3 kg (12/05 0419) Last BM Date:  (PTA)  Weight change: Filed Weights   05/20/21 1730 05/21/21 0450 05/22/21 0419  Weight: 67.3 kg 67.3 kg 67.3 kg    Intake/Output:   Intake/Output Summary (Last 24 hours) at 05/22/2021 1341 Last data filed at 05/22/2021 1321 Gross per 24 hour  Intake 66.22 ml  Output 375 ml  Net -308.78 ml      Physical Exam    General:  Thin, ill appearing. No distress. HEENT: + NG tube Neck: supple. + JVP to jaw. Carotids 2+ bilat; no bruits.  Cor: PMI nondisplaced. Rhythm irregular, tachycardic. No murmur. Lungs: clear Abdomen: soft, nontender, + distended. Diminished bowel sounds. Tender with palpation.  Extremities: no cyanosis, clubbing, rash, no LE edema Neuro: alert & orientedx3, cranial nerves grossly intact. moves all 4 extremities w/o difficulty. Affect pleasant    Telemetry   AF 100s -110s, up to 130s-140s with activity (personally reviewed)  Labs   Basic Metabolic Panel: Recent Labs  Lab 05/19/21 1445 05/20/21 0249 05/21/21 0248 05/22/21 0252  NA 133* 132* 135 135  K 5.0 5.4* 4.2 3.9  CL 91*  91* 94* 95*  CO2 29 28 30 27   GLUCOSE 108* 108* 105* 103*  BUN 40* 51* 33* 59*  CREATININE 5.55* 5.77* 3.55* 4.85*  CALCIUM 8.7* 8.4* 8.1* 8.2*  MG  --  2.1 1.9 2.2  PHOS   --  6.3* 5.5* 5.8*    Liver Function Tests: Recent Labs  Lab 05/19/21 1445 05/20/21 0249 05/21/21 0248 05/22/21 0252  AST 40 34 35 27  ALT 18 18 18 17   ALKPHOS 118 118 140* 152*  BILITOT 2.5* 2.7* 3.6* 2.1*  PROT 6.5 5.6* 5.3* 5.1*  ALBUMIN 2.8* 2.4* 2.2* 2.1*   Recent Labs  Lab 05/19/21 1445  LIPASE 51   No results for input(s): AMMONIA in the last 168 hours.  CBC: Recent Labs  Lab 05/19/21 1445 05/20/21 0249 05/20/21 1420 05/21/21 0248 05/21/21 1540 05/22/21 0252  WBC 8.3 8.0 6.2 7.7  --  7.2  NEUTROABS  --   --   --  5.4  --  6.0  HGB 8.7* 7.6* 6.9* 7.1* 8.4* 7.6*  HCT 24.5* 20.6* 18.8* 20.2* 23.6* 21.0*  MCV 108.9* 105.6* 105.6* 99.5  --  96.8  PLT 215 182 153 172  --  176    Cardiac Enzymes: No results for input(s): CKTOTAL, CKMB, CKMBINDEX, TROPONINI in the last 168 hours.  BNP: BNP (last 3 results) Recent Labs    08/01/20 2047  BNP 2,730.5*    ProBNP (last 3 results) No results for input(s): PROBNP in the last 8760 hours.   CBG: Recent Labs  Lab 05/22/21 0743 05/22/21 1158  GLUCAP 105* 99    Coagulation Studies: Recent Labs    05/20/21 0249  LABPROT 17.7*  INR 1.5*     Imaging   DG Abd Portable 1V  Result Date: 05/22/2021 CLINICAL DATA:  Abdominal pain and distention. EXAM: PORTABLE ABDOMEN - 1 VIEW COMPARISON:  May 21, 2021. FINDINGS: No abnormal bowel dilatation is noted. Biliary stent is noted in grossly good position. Distal tip of nasogastric tube is seen in expected position of proximal stomach. Stool is noted throughout the colon. Cholelithiasis is noted. IMPRESSION: Distal tip of nasogastric tube seen in expected position of proximal stomach. Biliary stent is in grossly good position. No abnormal bowel dilatation is noted. Cholelithiasis. Large stool burden is noted. Electronically Signed   By: Marijo Conception M.D.   On: 05/22/2021 10:16     Medications:     Current Medications:  Chlorhexidine Gluconate Cloth  6  each Topical Q0600   fluticasone furoate-vilanterol  1 puff Inhalation Daily   And   umeclidinium bromide  1 puff Inhalation Daily   pantoprazole (PROTONIX) IV  40 mg Intravenous Q12H   sorbitol, milk of mag, mineral oil, glycerin (SMOG) enema  960 mL Rectal Once    Infusions:      Assessment/Plan   1. Persistent Atrial Fibrillation/AFL - S/p DCCV 05/2018 - S/P DCCV 10/06/19 NSR - Recurrence 05/21. Has been seen in AF Clinic and rate control strategy recommended - Rate 100s -110s at rest, 130s-140s with minimal activity. Beta blocker on hold. BP has been soft.  - Will start amio gtt at 30/hr for rate control until he can transition back to po medicines - Holding anticoagulation d/t a/c anemia. Concern about risks of recurrent bleeding with anticoagulation. See below  2. Chronic combined Systolic/Diastolic Heart Failure:  - NICM. Cath 05/2011 without significant coronary disease.   - ECHO 12/2015 40%.  - Echo 05/2018: EF 25-30%.  - Echo 3/21  LVEF 35-40%, low normal RV function,severe pulm HTN. Severe LAE, mod RAE, mod MR, severe TR. RV TAPSE 1.6, RV s' 10 - Echo 9/21 EF 40-45%, RV mildly reduced, mild RAE, severe pulm artery systolic pressure, severe TR - NYHA III at baseline.  Appears slightly volume up. JVP to jaw in setting of severe TR. Volume managed with HD on T,T,S + takes 80 mg po torsemide at home (No po diuretic this admit) - GDMT including coreg and hydralazine/imdur on hold. BP soft.  - Off Entresto with ESRD - Repeat echo  3. Anemia with H/o GI bleeding: - Prior EGD in 2019 showed nonbleeding gastritis, Colonoscopy showed small internal hemorrhoids, otherwise normal.  - GI bleed 02/22 - gastric erosions on EGD - A/C anemia 11/22 - transfused with 3 uPRBCs - A/C anemia s/p 2 u PRBCs this admit. Hgb 7.6 today. Baseline hgb 8-9 - Received aranesp - Holding anticoagulation. May need to weigh risks/benefits of resuming anticoagulation with hx recurrent bleeding. Not sure  if he would be a good candidate for LAA occlusion with Watchman device  4. SBO - CT 12/04 w/ large amount of upper abdominal ascites and SBO - Currently NPO. Has NG tube - putting out dark fluid - Bili improved 3.1>2.1 - AST and ALT okay - General surgery and GI following  - Potentially going for surgery tomorrow - Can continue IV amio for rate control perioperatively. - He is at increased risk for surgery but not prohibitive. - Will obtain echo to reassess LV function/EF and PA pressures  5. Choledocholithiasis - S/p ERCP w/ stone extraction + placement of common bile duct and pancreatic duct stents in 11/22 -Planning for ERCP and stent removal in 3 weeks   6. Pulmonary HTN  - Last echo 09/21 with EF 40-45%, RV mildly reduced, RVSP 75 mmHg, severe TR  7. Abdominal ascites -Noted on CT -Mild hepatic nodularity present   8. HTN: - BP soft - Home medications held  9.  Sickle Cell:   - Follows closely with [facility] Cell [facility].    73. Former smoker:   - Quit 11/2015.   11. ESRD - On HD now T/T/S - Nephrology following   Length of Stay: 3  FINCH, Martha, PA-C  05/22/2021, 1:41 PM  Advanced Heart Failure Team Pager [phone] (M-F; 7a - 5p)  Please contact [facility] for night-coverage after hours (4p -7a ) and weekends on [email or web address]   Patient seen and examined with the above-signed Advanced Practice Provider and/or Housestaff. I personally reviewed laboratory data, imaging studies and relevant notes. I independently examined the patient and formulated the important aspects of the plan. I have edited the note to reflect any of my changes or salient points. I have personally discussed the plan with the patient and/or family.  65 y/o chronically ill male with sickle cell dz, ESRD, chronic systolic HF EF 76%, pulmonary HTN, chronic AF and legal blindness. Admitted with recurrent SBO. We are asked to help manage AF with RVR and assess peri-operative risk.   Last cath 2012  with normal cors. Last echo 9/21 EF 40-45% Mild RV dysfunction RVSP 75 severe TR, mod-severe MR. Minimally active at baseline.   AF rates 110-120 at rest up to 140 with any exertion. Denies CP.   General:  Chronically ill appearing. No resp difficulty HEENT: normal Neck: supple. JVP to jaw  Carotids 2+ bilat; no bruits. No lymphadenopathy or thryomegaly appreciated. Cor: PMI nondisplaced. Irregular tachy 2/6 TR Lungs: clear Abdomen: soft, nontender, +  distended. No hepatosplenomegaly. No bruits or masses. Hypoactive bowel sounds. Extremities: no cyanosis, clubbing, rash, edema Neuro: alert & orientedx3, cranial nerves grossly intact. moves all 4 extremities w/o difficulty. Affect pleasant  He is very tenuous and very high risk for any surgery. That said, given recurrent/refractory SBO, he may not have any other options. In order to proceed with surgery (particularly a laparotomy), Given his PAH, LV dysfunction and poor functional capacity, I think he would have to be willing to accept a fairly significant operative mortality risk to proceed.   We will check a repeat echo to reassess EF and pulmonary pressures to see if this changes anything. If he has progressive PAH may warrant empiric inotrope support versus R/L cath prior to OR in an attempt to optimize hemodynamically.   Will start IV amiodarone for rate control of his AF.   Glori Bickers, MD  9:40 PM

## 2021-05-22 NOTE — Progress Notes (Signed)
Progress Note     Subjective: Pt reports he is not feeling well this AM. His abdomen hurts although pain is not worse with palpation. He is not passing flatus and has not had a BM. He wants NGT out.   Objective: Vital signs in last 24 hours: Temp:  [97.4 F (36.3 C)-98.2 F (36.8 C)] 97.6 F (36.4 C) (12/05 0745) Pulse Rate:  [103-118] 116 (12/05 0745) Resp:  [12-19] 12 (12/05 0745) BP: (100-112)/(65-77) 112/69 (12/05 0745) SpO2:  [95 %-99 %] 98 % (12/05 0745) Weight:  [67.3 kg] 67.3 kg (12/05 0419) Last BM Date:  (PTA)  Intake/Output from previous day: 12/04 0701 - 12/05 0700 In: 381.2 [I.V.:66.2; Blood:315] Out: 200 [Emesis/NG output:200] Intake/Output this shift: Total I/O In: -  Out: 175 [Urine:175]  PE: General: pleasant, WD, elderly male who is laying in bed in NAD Heart: regular, rate, and rhythm.   Lungs: CTAB, no wheezes, rhonchi, or rales noted.  Respiratory effort nonlabored Abd: soft, NT on palpation, moderate distention, +BS, NGT with reddish brown drainage    Lab Results:  Recent Labs    05/21/21 0248 05/21/21 1540 05/22/21 0252  WBC 7.7  --  7.2  HGB 7.1* 8.4* 7.6*  HCT 20.2* 23.6* 21.0*  PLT 172  --  176   BMET Recent Labs    05/21/21 0248 05/22/21 0252  NA 135 135  K 4.2 3.9  CL 94* 95*  CO2 30 27  GLUCOSE 105* 103*  BUN 33* 59*  CREATININE 3.55* 4.85*  CALCIUM 8.1* 8.2*   PT/INR Recent Labs    05/20/21 0249  LABPROT 17.7*  INR 1.5*   CMP     Component Value Date/Time   NA 135 05/22/2021 0252   NA 135 05/24/2020 1119   K 3.9 05/22/2021 0252   CL 95 (L) 05/22/2021 0252   CO2 27 05/22/2021 0252   GLUCOSE 103 (H) 05/22/2021 0252   BUN 59 (H) 05/22/2021 0252   BUN 25 05/24/2020 1119   CREATININE 4.85 (H) 05/22/2021 0252   CREATININE 2.90 (H) 08/11/2015 1359   CALCIUM 8.2 (L) 05/22/2021 0252   PROT 5.1 (L) 05/22/2021 0252   PROT 7.5 05/24/2020 1119   ALBUMIN 2.1 (L) 05/22/2021 0252   ALBUMIN 4.1 05/24/2020 1119    AST 27 05/22/2021 0252   ALT 17 05/22/2021 0252   ALKPHOS 152 (H) 05/22/2021 0252   BILITOT 2.1 (H) 05/22/2021 0252   BILITOT 2.4 (H) 05/24/2020 1119   GFRNONAA 13 (L) 05/22/2021 0252   GFRNONAA 23 (L) 08/11/2015 1359   GFRAA 24 (L) 05/24/2020 1119   GFRAA 27 (L) 08/11/2015 1359   Lipase     Component Value Date/Time   LIPASE 51 05/19/2021 1445       Studies/Results: DG Abd 1 View  Result Date: 05/21/2021 CLINICAL DATA:  Small bowel obstruction. EXAM: ABDOMEN - 1 VIEW COMPARISON:  05/20/2021 FINDINGS: 0857 hours. Relatively gasless abdomen consistent with the diffuse fluid-filled small bowel seen on CT scan earlier today. NG tube tip is in the stomach with proximal side port of the NG tube at or just above the GE junction. Common bile duct stent again noted. IMPRESSION: Relatively gasless abdomen consistent with diffuse fluid-filled small bowel seen on CT scan earlier today. Proximal side port of the NG tube is at or just above the GE junction. NG tube could be advanced 3-4 cm to ensure side port placement below the GE junction. Electronically Signed   By: Verda Cumins.D.  On: 05/21/2021 10:51   CT ABDOMEN PELVIS W CONTRAST  Result Date: 05/21/2021 CLINICAL DATA:  Abdominal pain EXAM: CT ABDOMEN AND PELVIS WITH CONTRAST TECHNIQUE: Multidetector CT imaging of the abdomen and pelvis was performed using the standard protocol following bolus administration of intravenous contrast. CONTRAST:  9m ISOVUE-370 IOPAMIDOL (ISOVUE-370) INJECTION 76% COMPARISON:  05/19/2021 FINDINGS: LOWER CHEST: Moderate cardiomegaly HEPATOBILIARY: There is a common bile duct stent. There are stones and air within the gallbladder. Large amount of upper ascites, increased. Mild hepatic nodularity. PANCREAS: Normal pancreas. Pancreatic stent in unchanged position. No ductal dilatation or peripancreatic fluid collection. SPLEEN: Calcified splenic remnant ADRENALS/URINARY TRACT: The adrenal glands are normal. No  hydronephrosis, nephroureterolithiasis or solid renal mass. The urinary bladder is normal for degree of distention STOMACH/BOWEL: There is no hiatal hernia. Normal duodenal course and caliber. There is diffusely dilated small bowel, slightly progressed from the prior study. The terminal ileum is decompressed. No focal colonic abnormality. Normal appendix. VASCULAR/LYMPHATIC: Normal course and caliber of the major abdominal vessels. No abdominal or pelvic lymphadenopathy. REPRODUCTIVE: Normal prostate size with symmetric seminal vesicles. MUSCULOSKELETAL. Bone marrow changes reflecting sickle cell disease. OTHER: None. IMPRESSION: 1. Slightly progressed, diffusely dilated small bowel with decompressed terminal ileum, consistent with small bowel obstruction. 2. Large amount of upper abdominal ascites, increased from the prior study. 3. Moderate cardiomegaly. Aortic Atherosclerosis (ICD10-I70.0). Electronically Signed   By: KUlyses JarredM.D.   On: 05/21/2021 01:38    Anti-infectives: Anti-infectives (From admission, onward)    None        Assessment/Plan Choledocholithiasis s/p ERCP, declined laparoscopic cholecystectomy at [facility].1 this AM from 3.6 yesterday, Alk Phos still mildly elevated at 152, AST/ALT normal  - GI following as well SBO - CT 12/2 and yesterday with fluid-filled small bowel, CT yesterday with increased ascited - NGT with dark reddish brown output - pt denies passing flatus or BM - abdomen is not peritonitic - repeat KUB this AM, no indication for emergent surgical intervention at this time but we will continue to follow closely   FEN: NPO, NGT to LIWS VTE: SCDs, ok to have SQH or LMWH from a surgical standpoint ID: no current abx  ESRD CHF A. Fib on eliquis  LOS: 3 days    KNorm Parcel [facility]Ind[facility] 05/22/2021, 9:34 AM Please see Amion for pager number during day hours 7:00am-4:30pm

## 2021-05-23 ENCOUNTER — Inpatient Hospital Stay (HOSPITAL_COMMUNITY): Payer: HMO

## 2021-05-23 DIAGNOSIS — E43 Unspecified severe protein-calorie malnutrition: Secondary | ICD-10-CM | POA: Insufficient documentation

## 2021-05-23 DIAGNOSIS — N186 End stage renal disease: Secondary | ICD-10-CM | POA: Diagnosis not present

## 2021-05-23 DIAGNOSIS — I5022 Chronic systolic (congestive) heart failure: Secondary | ICD-10-CM | POA: Diagnosis not present

## 2021-05-23 DIAGNOSIS — R1084 Generalized abdominal pain: Secondary | ICD-10-CM | POA: Diagnosis not present

## 2021-05-23 DIAGNOSIS — K56609 Unspecified intestinal obstruction, unspecified as to partial versus complete obstruction: Secondary | ICD-10-CM | POA: Diagnosis not present

## 2021-05-23 DIAGNOSIS — E440 Moderate protein-calorie malnutrition: Secondary | ICD-10-CM | POA: Insufficient documentation

## 2021-05-23 HISTORY — PX: IR PERC TUN PERIT CATH WO PORT S&I /IMAG: IMG2327

## 2021-05-23 HISTORY — PX: IR US GUIDE VASC ACCESS RIGHT: IMG2390

## 2021-05-23 LAB — COMPREHENSIVE METABOLIC PANEL
ALT: 16 U/L (ref 0–44)
AST: 33 U/L (ref 15–41)
Albumin: 2.4 g/dL — ABNORMAL LOW (ref 3.5–5.0)
Alkaline Phosphatase: 168 U/L — ABNORMAL HIGH (ref 38–126)
Anion gap: 14 (ref 5–15)
BUN: 75 mg/dL — ABNORMAL HIGH (ref 8–23)
CO2: 27 mmol/L (ref 22–32)
Calcium: 8.6 mg/dL — ABNORMAL LOW (ref 8.9–10.3)
Chloride: 95 mmol/L — ABNORMAL LOW (ref 98–111)
Creatinine, Ser: 5.89 mg/dL — ABNORMAL HIGH (ref 0.61–1.24)
GFR, Estimated: 10 mL/min — ABNORMAL LOW (ref >60)
Glucose, Bld: 160 mg/dL — ABNORMAL HIGH (ref 70–99)
Potassium: 3.9 mmol/L (ref 3.5–5.1)
Sodium: 136 mmol/L (ref 135–145)
Total Bilirubin: 2 mg/dL — ABNORMAL HIGH (ref 0.3–1.2)
Total Protein: 5.9 g/dL — ABNORMAL LOW (ref 6.5–8.1)

## 2021-05-23 LAB — ECHOCARDIOGRAM COMPLETE
AR max vel: 2.28 cm2
AV Area VTI: 2.44 cm2
AV Area mean vel: 2.06 cm2
AV Mean grad: 3.3 mmHg
AV Peak grad: 6.3 mmHg
Ao pk vel: 1.25 m/s
Area-P 1/2: 4.78 cm2
Calc EF: 40.9 %
Height: 71 in
MV M vel: 4.99 m/s
MV Peak grad: 99.6 mmHg
Radius: 0.6 cm
S' Lateral: 4.6 cm
Single Plane A2C EF: 42.4 %
Single Plane A4C EF: 40.6 %
Weight: 2267.2 oz

## 2021-05-23 LAB — CBC WITH DIFFERENTIAL/PLATELET
Abs Immature Granulocytes: 0.11 10*3/uL — ABNORMAL HIGH (ref 0.00–0.07)
Basophils Absolute: 0.1 10*3/uL (ref 0.0–0.1)
Basophils Relative: 1 %
Eosinophils Absolute: 0.1 10*3/uL (ref 0.0–0.5)
Eosinophils Relative: 1 %
HCT: 22.5 % — ABNORMAL LOW (ref 39.0–52.0)
Hemoglobin: 7.8 g/dL — ABNORMAL LOW (ref 13.0–17.0)
Immature Granulocytes: 1 %
Lymphocytes Relative: 4 %
Lymphs Abs: 0.4 10*3/uL — ABNORMAL LOW (ref 0.7–4.0)
MCH: 34.7 pg — ABNORMAL HIGH (ref 26.0–34.0)
MCHC: 34.7 g/dL (ref 30.0–36.0)
MCV: 100 fL (ref 80.0–100.0)
Monocytes Absolute: 1.7 10*3/uL — ABNORMAL HIGH (ref 0.1–1.0)
Monocytes Relative: 19 %
Neutro Abs: 6.6 10*3/uL (ref 1.7–7.7)
Neutrophils Relative %: 74 %
Platelets: 193 10*3/uL (ref 150–400)
RBC: 2.25 MIL/uL — ABNORMAL LOW (ref 4.22–5.81)
RDW: 23.7 % — ABNORMAL HIGH (ref 11.5–15.5)
WBC: 9 10*3/uL (ref 4.0–10.5)
nRBC: 4.8 % — ABNORMAL HIGH (ref 0.0–0.2)

## 2021-05-23 LAB — MAGNESIUM: Magnesium: 2.4 mg/dL (ref 1.7–2.4)

## 2021-05-23 LAB — TRIGLYCERIDES: Triglycerides: 73 mg/dL (ref <150)

## 2021-05-23 LAB — PHOSPHORUS: Phosphorus: 5.3 mg/dL — ABNORMAL HIGH (ref 2.5–4.6)

## 2021-05-23 MED ORDER — INSULIN ASPART 100 UNIT/ML IJ SOLN
0.0000 [IU] | Freq: Four times a day (QID) | INTRAMUSCULAR | Status: DC
  Administered 2021-05-24: 1 [IU] via SUBCUTANEOUS
  Administered 2021-05-25: 2 [IU] via SUBCUTANEOUS

## 2021-05-23 MED ORDER — LIDOCAINE-EPINEPHRINE 1 %-1:100000 IJ SOLN
INTRAMUSCULAR | Status: AC
  Administered 2021-05-23: 10 mL
  Filled 2021-05-23: qty 1

## 2021-05-23 MED ORDER — TRACE MINERALS CU-MN-SE-ZN 300-55-60-3000 MCG/ML IV SOLN
INTRAVENOUS | Status: AC
  Filled 2021-05-23: qty 195.2

## 2021-05-23 MED ORDER — LIDOCAINE HCL 1 % IJ SOLN
INTRAMUSCULAR | Status: AC
  Filled 2021-05-23: qty 20

## 2021-05-23 MED ORDER — INSULIN ASPART 100 UNIT/ML IJ SOLN: 0.0000 [IU] | INTRAMUSCULAR | Status: DC

## 2021-05-23 MED ORDER — DARBEPOETIN ALFA 200 MCG/0.4ML IJ SOSY
200.0000 ug | PREFILLED_SYRINGE | INTRAMUSCULAR | Status: DC
  Filled 2021-05-23: qty 0.4

## 2021-05-23 NOTE — Progress Notes (Signed)
Mobility Specialist: Progress Note   05/23/21 1610  Mobility  Activity Ambulated in room  Level of Assistance Independent  Assistive Device None  Distance Ambulated (ft) 40 ft  Mobility Ambulated with assistance in room  Mobility Response Tolerated well  Mobility performed by Mobility specialist  $Mobility charge 1 Mobility   Pre-Mobility: 112 HR, 100% SpO2 Post-Mobility: 114 HR, 95% SpO2  Pt had no c/o during ambulation. Pt required verbal cues for direction, otherwise independent. Pt sitting EOB after walk with call bell and phone at his side. Family member present in the room.   [facility] Carmellia Kreisler Mobility Specialist Mobility Specialist Phone #1: [phone] Mobility Specialist Phone #2: [phone]

## 2021-05-23 NOTE — Progress Notes (Signed)
PHARMACY - TOTAL PARENTERAL NUTRITION CONSULT NOTE   Indication: Small bowel obstruction  Patient Measurements: Height: 5' 11"  (180.3 cm) Weight: 64.3 kg (141 lb 11.2 oz) IBW/kg (Calculated) : 75.3 TPN AdjBW (KG): 67.6 Body mass index is 19.76 kg/m. Usual Weight: 154 lb 02/28/21  Assessment: 22 yom with ESRD admitted with worsening abdominal pain found to have a high-grade SBO. He had a recent admission at [facility] for SBO that resolved with conservative management; also had choledocholithiasis s/p ERCP with stone extraction, stent to CBD and PD. Lap chole was recommended but he declined. NGT was placed to LIWS this admit with dark reddish brown output; Surgery reports no flatus or BM except with enema on 12/5 and unlikely to resolve without surgical intervention. Given prolonged NPO status, pharmacy consulted for TPN for nutritional support. At risk for refeeding syndrome.  Glucose / Insulin: no hx DM, BG 89-105, no insulin Electrolytes: CO2 WNL, Cl 95, Phos down 5.3, all others WNL Renal: ESRD TTS, last 12/3 Hepatic: LFTs WNL, Tbili down to 2, Alk Phos up 168, TG pending, albumin 2.4 Intake / Output; MIVF: 475 ml UOP, 400 ml NGT output GI Imaging: 12/3 Abd XR: Probable persistent SBO not well evaluated 12/4 CT A/P: diffusely dilated SB with decompressed terminal ileum c/w SBO 12/6 Abd XR: dilation of small-bowel loops suggesting ileus or pSBO GI Surgeries / Procedures:   Central access: IJ to be placed by IR 12/6 TPN start date: 12/6  Nutritional Goals: Goal concentrated TPN rate is 55 mL/hr (provides 80 g of protein and 1728 kcals per day)  RD Assessment: pending    Current Nutrition:  NPO and TPN  Plan:  Start concentrated TPN at 109m/hr at 1800 Electrolytes in TPN (1/2 standard): Na 237m/L, K 2537mL, Ca 2mE56m, Mg 2mEq2m and no Phos. Max Cl Add standard MVI and trace elements to TPN - consider decreasing to 1/2 trace elements if Tbili increases Initiate  very  Sensitive q6h SSI and adjust as needed  Monitor TPN labs on Mon/Thurs and in am  Thank you for involving pharmacy in this patient's care.  JenniRenold GentarmD, BCPS Clinical Pharmacist Clinical phone for 05/23/2021 until 3p is [phone] 9:37 AM  **Pharmacist phone directory can be found on [email or web address] under [facility]

## 2021-05-23 NOTE — Care Management Important Message (Signed)
Important Message  Patient Details  Name: Patrick [name] MRN: [id number] Date of Birth: 15-Feb-1956   Medicare Important Message Given:  Yes     [name] 05/23/2021, 8:45 AM

## 2021-05-23 NOTE — Progress Notes (Signed)
Subjective: Seen in room states abdominal comfort feels better for dialysis today, noted still with 400 cc of NG tube output, and still prolonged n.p.o.= surgery ordering TNA via IJ line placement today  Objective Vital signs in last 24 hours: Vitals:   05/22/21 1943 05/22/21 2349 05/23/21 0415 05/23/21 0758  BP: 108/81 122/77 108/80   Pulse: (!) 106 (!) 103 100   Resp: 16 18 20    Temp: 97.6 F (36.4 C) (!) 97.3 F (36.3 C) 97.6 F (36.4 C) (!) 97.4 F (36.3 C)  TempSrc: Oral Oral Oral Oral  SpO2: 100% 100% 100%   Weight:   64.3 kg   Height:       Weight change: -3.025 kg  Physical Exam: General: Alert thin chronically ill male, appropriate NAD Heart: RRR no MRG noted Lungs: CTA nonlabored breathing Abdomen: Positive bowel sounds slightly distant, soft, NT, minimal distention Extremities: No pedal edema  dialysis Access: RUA aVF+ bruit  Dialysis Orders: TTS at AF 4hr, 400/500, EDW 65kg, 2K/2Ca, UFP #2, AVF, no heparin - Sensipar 30mg  PO q HD - Venofer 50mg  IV weekly - Mircera 287mcg IV q 2 weeks (last 11/8)   Problem/Plan;    SBO, recent gallstone with CBD/pancreatic duct stenting: Plan Per admit and surgery team -Central line for TPN today, discomfort improving  ESRD:  Continue HD TTS schedule -today on schedule No heparin.  K3.9 yesterday follow-up K predialysis with NG tube suction  Hypertension/volume: BP l lowish but improving, no Vol. excess on exam.  No UF with HD  Anemia of ESRD and history of sickle cell= Hgb 7.8 <7.6 <8.4 //transfused 1U PRBCs prior 6.9  Aranesp missed with HD 12/03 -> given subcu 12/04 200 MCG.  We will continue q. Saturday HD  Metabolic bone disease: Ca/Phos ok - resume binders when cleared to eat.  Nutrition: Has NG T, will order supplements when cleared to eat.  A-fib: On amnio drip, holding Eliquis D/t anemia risk of bleeding, per admit /cardiology      Ernest Haber, PA-C [facility] Beeper [phone] 05/23/2021,9:32 AM   LOS: 4 days   Labs: Basic Metabolic Panel: Recent Labs  Lab 05/21/21 0248 05/22/21 0252 05/23/21 0131  NA 135 135 136  K 4.2 3.9 3.9  CL 94* 95* 95*  CO2 30 27 27   GLUCOSE 105* 103* 160*  BUN 33* 59* 75*  CREATININE 3.55* 4.85* 5.89*  CALCIUM 8.1* 8.2* 8.6*  PHOS 5.5* 5.8* 5.3*   Liver Function Tests: Recent Labs  Lab 05/21/21 0248 05/22/21 0252 05/23/21 0131  AST 35 27 33  ALT 18 17 16   ALKPHOS 140* 152* 168*  BILITOT 3.6* 2.1* 2.0*  PROT 5.3* 5.1* 5.9*  ALBUMIN 2.2* 2.1* 2.4*   Recent Labs  Lab 05/19/21 1445  LIPASE 51   No results for input(s): AMMONIA in the last 168 hours. CBC: Recent Labs  Lab 05/20/21 0249 05/20/21 1420 05/21/21 0248 05/21/21 1540 05/22/21 0252 05/23/21 0131  WBC 8.0 6.2 7.7  --  7.2 9.0  NEUTROABS  --   --  5.4  --  6.0 6.6  HGB 7.6* 6.9* 7.1* 8.4* 7.6* 7.8*  HCT 20.6* 18.8* 20.2* 23.6* 21.0* 22.5*  MCV 105.6* 105.6* 99.5  --  96.8 100.0  PLT 182 153 172  --  176 193   Cardiac Enzymes: No results for input(s): CKTOTAL, CKMB, CKMBINDEX, TROPONINI in the last 168 hours. CBG: Recent Labs  Lab 05/22/21 0743 05/22/21 1158 05/22/21 1551  GLUCAP 105* 99 89  Medications:  amiodarone 30 mg/hr (05/23/21 0230)    fluticasone furoate-vilanterol  1 puff Inhalation Daily   And   umeclidinium bromide  1 puff Inhalation Daily   pantoprazole (PROTONIX) IV  40 mg Intravenous Q12H

## 2021-05-23 NOTE — Progress Notes (Signed)
PROGRESS NOTE    KAIRO LAUBACHER  ZOX:[id number] DOB: 1956/02/12 DOA: 05/19/2021 PCP: Dorena Dew, FNP   Brief Narrative:  The patient is a 65 year old African-American male with a past medical history significant for but not limited to ESRD on dialysis Tuesday, Thursdays, Saturday, anemia of chronic kidney disease as well as history of sickle cell with baseline hemoglobin of 8-11, chronic systolic CHF, chronic atrial fibrillation with anticoagulant Eliquis as well as other comorbidities who presented with small bowel obstruction after presenting from his home to the ED complaining of abdominal pain.  He was recently hospitalized at [facility] from 05/09/2025 for small bowel obstruction and subsequently resolved with conservative measures in addition to management of choledocholithiasis via ERCP stone removal and stents to the common bile duct as well as the pancreatic duct.  During his previous hospitalization general surgery reportedly recommended cholecystectomy but the patient ultimately refused this measure at that time.  His additional hospital course was notable for acute on chronic anemia with a hemoglobin on the day of admission of 5.7 requiring PRBCs as an swelling hemoglobin improved to follow value of 7.9 at the time of discharge.  Over last few days since being discharged patient has had worsening generalized abdominal discomfort and it is initially intermittent but then became constant and was worse with palpation or attempts to eat or drink anything with that timeframe.  He also notes having new nausea and vomiting last few days is nonbloody and nonbilious emesis along that timeframe with most recent episodes occurring in the ED.  He also noted a decline in his flatus and has had a bowel movement 2 to 3 days.  Normally he takes as needed oxycodone as an outpatient.  He presented the ED and was admitted for small bowel obstruction and a NG tube placed in the ED is attached to low  intermittent wall suction.  In the ED he received fentanyl, Zofran and a normal saline bolus.  Because he has a AICD patient nephrology was consulted for maintenance of dialysis and he underwent dialysis today.  Patient's blood count has been steadily dropping and he had a very large amount of dark bilious drainage from his NG with possible blood in it.  Given his decline in hemoglobin he was placed on a PPI twice daily and GI was called for further evaluation and we will obtain a gastric occult on the contents from his NG.  GI recommends continuing to monitor CBGs and his blood count was on the lower side again this morning.  Typed and screened and transfused 1 unit PRBCs this AM.  General surgery recommending continuing NG tube and if he does not improve that he may need surgical intervention.  Given that his heart rate is uncontrolled still cardiology is consulted for further evaluation recommendations.  Assessment & Plan:   Principal Problem:   SBO (small bowel obstruction) (HCC) Active Problems:   Abdominal pain   Nausea & vomiting   Anemia   End stage renal disease (HCC)   Permanent atrial fibrillation (HCC)   Chronic systolic CHF (congestive heart failure) (HCC)   Protein-calorie malnutrition, severe  Small bowel obstruction -His worsening abdominal pain in the last 3 to 4 days associated with nausea, vomiting, decreased flatus production as well as imaging showing high-grade small bowel obstruction in the mid ileum with transition zone and no bowel abscess or perforation. -Recently hospitalized in [place] for small bowel obstruction that resolved with conservative -General surgery consulted for further evaluation and  recommending continue NG tube in place and testing to low intermittent wall suction -Pain control has been changed and will stop his morphine and start hydromorphone -Early mobilization and sure electrolytes are within normal limits.  We will ensure potassium greater than  4 and if magnesium level greater than 2 -NG tube continues to put out but not as much as yesterday.  Is a little hypotensive and tachycardic with A. fib with RVR -Continue supportive care and antiemetics as patient is strict n.p.o. -General surgery consulted and recommending a small bowel protocol and NG tube for decompression; patient has no history of reported surgeries but general surgery is recommending a diagnostic laparoscopically with lysis of adhesions and simultaneous laparoscopic cholecystectomy and performing this in the next few days; general surgery recommends continue NG tube for now and supportive care and repeat a KUB.  They felt like the patient had contrast and stool in his colon but if he does not improve the next 24 to 48 hours he will require surgical intervention -Surgery obtained a KUB yesterday and it showed "Distal tip of nasogastric tube seen in expected position of proximal stomach. Biliary stent is in grossly good position. No abnormal bowel dilatation is noted. Cholelithiasis. Large stool burden is noted."  Upon repeat KUB today shows persistently dilated small bowel -They gave the patient a smog enema with little to no results given his stool burden -Surgery feels that his NG and output recorded for 200 cc yesterday and a CT scan the day before yesterday shows dilated small bowel and upper abdominal ascites.  His bilirubin is improving as well as lipase was normal and 30 feel that his major problem is SBO and that he is not better that they think he is going to need a diagnostic laparoscopic surgery given that he has no real bowel function no prior surgery and will continue SBO the surgical team thinks that he needs laparotomy for this and sooner the better but he has other medical issues and is cardiac status is significant; surgery hoping that this can be made better and planning on surgery soon will consider cholecystectomy at the same time as laparotomy; surgery feels that  he needs an operation but is noted to be a very high risk from a cardiology standpoint -Given his prolonged n.p.o. status IR placing IJ to the TPN can be started for his nutritional status -PT OT recommending SNF once stable for discharge  End stage renal disease on hemodialysis Tuesday Thursday Saturday Hyponatremia Hyperphosphatemia -Patient underwent dialysis yesterday and nephrology been consulted -He did have hyperkalemia but this was able to be corrected in dialysis -Patient has abdominal ascites as well due to his ESRD and GI recommended no additional recommendations or treatment from their service at this time and they recommended the patient follow-up with his biliary endoscopy in [facility] for repeat ERCP and stent removal for above -Strict I's and O's and daily weights -Patient was noted to be given 200 mcg of Aranesp on 05/21/2021 and will continue q. Saturday with HD -Continue to monitor renal function carefully and BUN/creatinine went from 40/5.55 -> 51/5.77 -> 33/3.55 -> 59/4.85 and today is 75/5.89 -His phosphorus level is now 5.3 and down from 5.8 -Further care per nephrology appreciate their assistance -Nephrology feels that that she keep him on his cycle and plan on dialyzing him today  Acute on chronic anemia in setting of sickle cell anemia and chronic kidney disease -Patient is a baseline hemoglobin/hematocrit of 8-10 -Presented with hemoglobin of 8.7  however has been slowly trending down given his dark bilious fluid question of whether there is blood in -Empirically start PPI twice daily and consulted GI for further recommendations and will obtain gastric occult; GI recommends continuing conservative measures and continue monitoring -He is status post 2 units of PRBCs and hemoglobin/hematocrit trended up to 8.4/20.6 is now 7.6/21.0 yesterday and today it is 7.8/22.5 -Given Arsnep subcu yesterday -Continue to monitor for signs and symptoms of bleeding; may need more blood  transfusion given his sickle cell anemia -Continue to monitor and trend CBC and check INR in the a.m.  Chronic systolic CHF -Echo done in the last year showed EF of 40 to 45% with indeterminate diastolic parameters -Patient has volume managed with dialysis; will try cautious IV fluid hydration with D5 half-normal saline given his n.p.o. status at 50 MLS per hour and will now stop -Hold his home antihypertensives given his n.p.o. status and conservative management of SBO -Patient is - 1.495 L since admission but this is an accurate -Continue monitor and trend strict I's and O's and daily weights -Cardiology was consulted for further evaluation recommendations and they feel that he appears slightly volume up with JVP to his jaw and he is volume managed with HD and takes 80 mg of p.o. torsemide at home given his soft blood pressures but cannot give him p.o. torsemide and his GDMT including carvedilol and hydralazine Imdur is on hold given his soft blood pressures -Cardiology is repeating echocardiogram here as his last echocardiogram was done 03/08/2021; repeat echocardiogram done and showed EF of 40 to 45% with mild right ventricular dysfunction and severe biatrial enlargement and severe mitral regurg and tricuspid regurg  Chronic atrial fibrillation now with RVR -CHA2DS2-VASc score is 3 -He is chronically anticoagulated with Eliquis and on carvedilol for beta-blockade -Had an echocardiogram last year that showed severely dilated bilateral atria as well as moderate to severe mitral regurgitation -Presented with rate controlled A. fib without evidence of acute ischemic changes -Currently hold his apixaban if he may go to surgery this week and may consider heparin drip but will hold for now given that his blood count is dropping and that he is getting 2 units of PRBCs today -Cardiology was consulted for his persistent atrial fibrillation and they are planning on starting an amnio drip at 30 mg/h for  rate control until he can be transitioned back to his p.o. medications and they are holding anticoagulation due to his anemia given has concerns about recurrent bleeding with anticoagulation -His A. fib is better with the amnio drip and repeat echocardiogram suggestive of restrictive cardiomyopathy with severe mitral regurgitation/tricuspid regurgitation and cardiology feels that they can doubt that they can do much to lower his operative cardiovascular risk in the setting -Cardiology recommends okay to proceed from surgery as long as the patient understands that the perioperative complications including death are nontrivial  COPD Pulmonary hypertension -Patient is a former smoker and quit in 2007 -Continue with his ministry and albuterol inhaler -Continue to monitor for respiratory compromise -SpO2: 100 % -Continue with pulse oximetry maintain O2 saturations greater than 90%  Hyperbilirubinemia -In the setting of sickle cell anemia and hemolysis -Patient's T bili trended up slightly and went from 2.5 and now 2.7 and is further worsened to 3.6 is now improving to 2.1 yesterday and today is 2.0 -Patient is status post 2 units of PRBCs -Continue to monitor and trend and repeat CMP in a.m.  Sickle cell disease -Continue with pain control and  transfuse PRBCs as needed -If necessary need oxygen -Currently does not appear to be an acute chest syndrome or pain crisis -Patient follows up with the sickle cell clinic -Continue monitor continue pain control  Severe protein calorie malnutrition -Patient is initiated on TPN and they will meet 100% of his estimated needs -Nutritionist consulted and recommending monitoring magnesium, potassium and phosphorus twice daily for least 3 days given the risk of refeeding syndrome -Patient had an IR guided central venous catheter placed today  DVT prophylaxis: SCDs and will hold his anticoagulation given he may go for surgical intervention Code Status: FULL  CODE  Family Communication: No family currently at bedside Disposition Plan: Pending further clinical improvement and improvement of his small bowel obstruction and clearance by specialists; he needs surgery and he is a high risk candidate for surgical intervention  Status is: Inpatient  Remains inpatient appropriate because: Remains n.p.o. and still has no bowel movements and will likely be going for surgical intervention  Consultants:  General Surgery Nephrology Gastroenterology Cardiology Interventional radiology  Procedures: NG tube placement   IR image guided tunneled central venous catheter placed which is a 6 French 25 cm tunneled dual-lumen CVC with the tip at the cavoatrial junction  Echocardiogram is being repeated   Antimicrobials:  Anti-infectives (From admission, onward)    None       Subjective: Seen and examined at bedside and the specialist felt that he would benefit from TNA so a IJ CVC was placed an order for TPN to be initiated.  His echo was done and showed the severe mitral gravitation and tricuspid regurgitation.  Cardiology felt that he is a high risk for surgical intervention but likely will need it.  Patient sitting up in the bed talking with his friend appears calm and in no acute distress.  Heart rate better controlled.  He denies any nausea and states that he still not passing any flatus or had any bowel movements.  Has some slight abdominal pain.  No other concerns or complaints this time  Objective: Vitals:   05/22/21 2349 05/23/21 0415 05/23/21 0758 05/23/21 1501  BP: 122/77 108/80  109/72  Pulse: (!) 103 100  100  Resp: 18 20  12   Temp: (!) 97.3 F (36.3 C) 97.6 F (36.4 C) (!) 97.4 F (36.3 C) (!) 97.4 F (36.3 C)  TempSrc: Oral Oral Oral Oral  SpO2: 100% 100%  100%  Weight:  64.3 kg    Height:        Intake/Output Summary (Last 24 hours) at 05/23/2021 1700 Last data filed at 05/23/2021 0148 Gross per 24 hour  Intake 102.51 ml   Output 500 ml  Net -397.49 ml    Filed Weights   05/21/21 0450 05/22/21 0419 05/23/21 0415  Weight: 67.3 kg 67.3 kg 64.3 kg   Examination: Physical Exam:  Constitutional: Thin chronically ill-appearing African-American male currently in no acute distress appears calm sitting in the edge of the bed comfortable talking with his friend Eyes: Lids and conjunctivae normal, sclerae mildly icteric ENMT: External Ears, Nose appear normal. Grossly normal hearing.  Has NG tube in place Neck: Appears normal, supple, no cervical masses, normal ROM, no appreciable thyromegaly; no appreciable JVD Respiratory: Diminished to auscultation bilaterally with coarse breath sounds, no wheezing, rales, rhonchi or crackles. Normal respiratory effort and patient is not tachypenic. No accessory muscle use.  Not wearing supplemental oxygen via nasal cannula Cardiovascular: Irregularly irregular but slightly tachycardic, no murmurs / rubs / gallops. S1 and  S2 auscultated.  1+ extremity edema Abdomen: Soft, mildly tender-tender, distended secondary body habitus. No masses palpated. Bowel sounds positive.  GU: Deferred. Musculoskeletal: No clubbing / cyanosis of digits/nails. No joint deformity upper and lower extremities.  Skin: No rashes, lesions, ulcers on limited skin evaluation. No induration; Warm and dry.  Neurologic: CN 2-12 grossly intact with no focal deficits. Romberg sign and cerebellar reflexes not assessed.  Psychiatric: Normal judgment and insight. Alert and oriented x 3. Normal mood and appropriate affect.   Data Reviewed: I have personally reviewed following labs and imaging studies  CBC: Recent Labs  Lab 05/20/21 0249 05/20/21 1420 05/21/21 0248 05/21/21 1540 05/22/21 0252 05/23/21 0131  WBC 8.0 6.2 7.7  --  7.2 9.0  NEUTROABS  --   --  5.4  --  6.0 6.6  HGB 7.6* 6.9* 7.1* 8.4* 7.6* 7.8*  HCT 20.6* 18.8* 20.2* 23.6* 21.0* 22.5*  MCV 105.6* 105.6* 99.5  --  96.8 100.0  PLT 182 153 172   --  176 027    Basic Metabolic Panel: Recent Labs  Lab 05/19/21 1445 05/20/21 0249 05/21/21 0248 05/22/21 0252 05/23/21 0131  NA 133* 132* 135 135 136  K 5.0 5.4* 4.2 3.9 3.9  CL 91* 91* 94* 95* 95*  CO2 29 28 30 27 27   GLUCOSE 108* 108* 105* 103* 160*  BUN 40* 51* 33* 59* 75*  CREATININE 5.55* 5.77* 3.55* 4.85* 5.89*  CALCIUM 8.7* 8.4* 8.1* 8.2* 8.6*  MG  --  2.1 1.9 2.2 2.4  PHOS  --  6.3* 5.5* 5.8* 5.3*    GFR: Estimated Creatinine Clearance: 11.7 mL/min (A) (by C-G formula based on SCr of 5.89 mg/dL (H)). Liver Function Tests: Recent Labs  Lab 05/19/21 1445 05/20/21 0249 05/21/21 0248 05/22/21 0252 05/23/21 0131  AST 40 34 35 27 33  ALT 18 18 18 17 16   ALKPHOS 118 118 140* 152* 168*  BILITOT 2.5* 2.7* 3.6* 2.1* 2.0*  PROT 6.5 5.6* 5.3* 5.1* 5.9*  ALBUMIN 2.8* 2.4* 2.2* 2.1* 2.4*    Recent Labs  Lab 05/19/21 1445  LIPASE 51    No results for input(s): AMMONIA in the last 168 hours. Coagulation Profile: Recent Labs  Lab 05/20/21 0249  INR 1.5*    Cardiac Enzymes: No results for input(s): CKTOTAL, CKMB, CKMBINDEX, TROPONINI in the last 168 hours. BNP (last 3 results) No results for input(s): PROBNP in the last 8760 hours. HbA1C: No results for input(s): HGBA1C in the last 72 hours. CBG: Recent Labs  Lab 05/22/21 0743 05/22/21 1158 05/22/21 1551  GLUCAP 105* 99 89    Lipid Profile: Recent Labs    05/23/21 0131  TRIG 73   Thyroid Function Tests: No results for input(s): TSH, T4TOTAL, FREET4, T3FREE, THYROIDAB in the last 72 hours. Anemia Panel: No results for input(s): VITAMINB12, FOLATE, FERRITIN, TIBC, IRON, RETICCTPCT in the last 72 hours. Sepsis Labs: No results for input(s): PROCALCITON, LATICACIDVEN in the last 168 hours.  Recent Results (from the past 240 hour(s))  Resp Panel by RT-PCR (Flu A&B, Covid) Nasopharyngeal Swab     Status: None   Collection Time: 05/19/21  6:29 PM   Specimen: Nasopharyngeal Swab; Nasopharyngeal(NP)  swabs in vial transport medium  Result Value Ref Range Status   SARS Coronavirus 2 by RT PCR NEGATIVE NEGATIVE Final    Comment: (NOTE) SARS-CoV-2 target nucleic acids are NOT DETECTED.  The SARS-CoV-2 RNA is generally detectable in upper respiratory specimens during the acute phase of infection. The  lowest concentration of SARS-CoV-2 viral copies this assay can detect is 138 copies/mL. A negative result does not preclude SARS-Cov-2 infection and should not be used as the sole basis for treatment or other patient management decisions. A negative result may occur with  improper specimen collection/handling, submission of specimen other than nasopharyngeal swab, presence of viral mutation(s) within the areas targeted by this assay, and inadequate number of viral copies(<138 copies/mL). A negative result must be combined with clinical observations, patient history, and epidemiological information. The expected result is Negative.  Fact Sheet for Patients:  EntrepreneurPulse.com.au  Fact Sheet for Healthcare Providers:  IncredibleEmployment.be  This test is no t yet approved or cleared by the [place] FDA and  has been authorized for detection and/or diagnosis of SARS-CoV-2 by FDA under an Emergency Use Authorization (EUA). This EUA will remain  in effect (meaning this test can be used) for the duration of the COVID-19 declaration under Section 564(b)(1) of the Act, 21 U.S.C.section 360bbb-3(b)(1), unless the authorization is terminated  or revoked sooner.       Influenza A by PCR NEGATIVE NEGATIVE Final   Influenza B by PCR NEGATIVE NEGATIVE Final    Comment: (NOTE) The Xpert Xpress SARS-CoV-2/FLU/RSV plus assay is intended as an aid in the diagnosis of influenza from Nasopharyngeal swab specimens and should not be used as a sole basis for treatment. Nasal washings and aspirates are unacceptable for Xpert Xpress  SARS-CoV-2/FLU/RSV testing.  Fact Sheet for Patients: EntrepreneurPulse.com.au  Fact Sheet for Healthcare Providers: IncredibleEmployment.be  This test is not yet approved or cleared by the [place] FDA and has been authorized for detection and/or diagnosis of SARS-CoV-2 by FDA under an Emergency Use Authorization (EUA). This EUA will remain in effect (meaning this test can be used) for the duration of the COVID-19 declaration under Section 564(b)(1) of the Act, 21 U.S.C. section 360bbb-3(b)(1), unless the authorization is terminated or revoked.  Performed at [facility], [place] [address] [place], [place] [address]      RN Pressure Injury Documentation:     Estimated body mass index is 19.76 kg/m as calculated from the following:   Height as of this encounter: $RemoveBeforeD'5\' 11"'[id number]$  (1.803 m).   Weight as of this encounter: 64.3 kg.  Malnutrition Type: Nutrition Problem: Severe Malnutrition Etiology: acute illness (SBO) Malnutrition Characteristics: Signs/Symptoms: moderate fat depletion, moderate muscle depletion, percent weight loss (8% weight loss within 3 months) Percent weight loss: 8 % Nutrition Interventions: Interventions: TPN Radiology Studies: IR US Guide Vasc Access Right  Result Date: 05/23/2021 INDICATION: 65 year old male with end-stage renal disease requiring central venous access for nutrition supplementation. EXAM: 1. Ultrasound-guided venipuncture of the jugular vein 2. Fluoroscopic guided placement of tunneled central venous catheter MEDICATIONS: None. ANESTHESIA/SEDATION: Local anesthesia only. FLUOROSCOPY TIME:  0 minutes 48 seconds (3 mGy). COMPLICATIONS: None immediate. PROCEDURE: Informed written consent was obtained from the patient after a discussion of the risks, benefits, and alternatives to treatment. Questions regarding the procedure were encouraged and answered. The right neck and chest were prepped with  chlorhexidine in a sterile fashion, and a sterile drape was applied covering the operative field. Maximum barrier sterile technique with sterile gowns and gloves were used for the procedure. A timeout was performed prior to the initiation of the procedure. After creating a small venotomy incision, a 21 gauge micropuncture kit was utilized to access the internal jugular vein. Real-time ultrasound guidance was utilized for vascular access including the acquisition of a permanent ultrasound image documenting  patency of the accessed vessel. A Mandril wire to the level of the cavoatrial junction. A 6 French tunneled central venous catheter measuring 25 cm from tip to cuff was tunneled in a retrograde fashion from the anterior chest wall to the venotomy incision. A peel-away sheath was placed over the wire. The catheter was then placed through the peel-away sheath with the catheter tip ultimately positioned at the cavoatrial junction. Final catheter positioning was confirmed and documented with a spot radiographic image. The catheter aspirates and flushes normally. The catheter was flushed with appropriate volume heparin dwells. The catheter exit site was secured with a 0-Prolene retention suture. The venotomy incision was closed with Dermabond. Sterile dressings were applied. The patient tolerated the procedure well without immediate post procedural complication. IMPRESSION: Successful placement of 25 cm tip to cuff tunneled 6 French central venous catheter via the right internal jugular vein with catheter tip terminating at the cavoatrial junction. The catheter is ready for immediate use. Ruthann Cancer, MD [facility] Radiology Electronically Signed   By: Ruthann Cancer M.D.   On: 05/23/2021 12:30   DG Abd Portable 1V  Result Date: 05/23/2021 CLINICAL DATA:  Abdominal pain and distention EXAM: PORTABLE ABDOMEN - 1 VIEW COMPARISON:  05/22/2021 FINDINGS: There is dilation  of small-bowel loops measuring up to 4.4 cm in diameter. Stomach is not distended. Tip of enteric tube is seen in the stomach. Side-port in the enteric tube is at the gastroesophageal junction. Colon is not distended. There is biliary stent in the course of common bile duct. Gallbladder stones are seen. IMPRESSION: There is dilation of small-bowel loops suggesting ileus or partial small bowel obstruction. Electronically Signed   By: Elmer Picker M.D.   On: 05/23/2021 09:44   DG Abd Portable 1V  Result Date: 05/22/2021 CLINICAL DATA:  Abdominal pain and distention. EXAM: PORTABLE ABDOMEN - 1 VIEW COMPARISON:  May 21, 2021. FINDINGS: No abnormal bowel dilatation is noted. Biliary stent is noted in grossly good position. Distal tip of nasogastric tube is seen in expected position of proximal stomach. Stool is noted throughout the colon. Cholelithiasis is noted. IMPRESSION: Distal tip of nasogastric tube seen in expected position of proximal stomach. Biliary stent is in grossly good position. No abnormal bowel dilatation is noted. Cholelithiasis. Large stool burden is noted. Electronically Signed   By: Marijo Conception M.D.   On: 05/22/2021 10:16   ECHOCARDIOGRAM COMPLETE  Result Date: 05/23/2021    ECHOCARDIOGRAM REPORT   Patient Name:   RICCARDO HOLEMAN Date of Exam: 05/23/2021 Medical Rec #:  [id number]           Height:       71.0 in Accession #:    [id number]          Weight:       141.7 lb Date of Birth:  [birth year]           BSA:          1.821 m Patient Age:    55 years            BP:           122/77 mmHg Patient Gender: M                   HR:           100 bpm. Exam Location:  Inpatient Procedure: 2D Echo, Cardiac Doppler and Color Doppler Indications:    CHF, PHTN  History:  Patient has prior history of Echocardiogram examinations, most                 recent 09/12/2019. CHF, Signs/Symptoms:Chest Pain; Risk                 Factors:Hypertension.  Sonographer:    Glo Herring  Referring Phys: [phone] LINDSAY NICOLE Vining  1. Left ventricular ejection fraction, by estimation, is 40 to 45%. The left ventricle has mildly decreased function. The left ventricle demonstrates global hypokinesis. The left ventricular internal cavity size was moderately dilated. There is moderate  concentric left ventricular hypertrophy. Left ventricular diastolic parameters are indeterminate.  2. Right ventricular systolic function is mildly reduced. The right ventricular size is severely enlarged. There is moderately elevated pulmonary artery systolic pressure.  3. Left atrial size was severely dilated.  4. Right atrial size was severely dilated.  5. The mitral valve is grossly normal. Severe mitral valve regurgitation.  6. Mechanism appears to be secondary MR; there are multuiple jets that in summation suggestive severe regurgitation; only blunting of the pulmonary vein flow. Consider secondary imaging modality (TEE vs CMR) if procedual intervention is to be considered.  7. The tricuspid valve is abnormal. Tricuspid valve regurgitation is severe.  8. The aortic valve is tricuspid. There is mild calcification of the aortic valve. Aortic valve regurgitation is not visualized. Aortic valve sclerosis is present, with no evidence of aortic valve stenosis.  9. The inferior vena cava is dilated in size with <50% respiratory variability, suggesting right atrial pressure of 15 mmHg. Comparison(s): A prior study was performed on 02/17/20. Prior images reviewed side by side. Similar LV size and dilation. FINDINGS  Left Ventricle: Left ventricular ejection fraction, by estimation, is 40 to 45%. The left ventricle has mildly decreased function. The left ventricle demonstrates global hypokinesis. The left ventricular internal cavity size was moderately dilated. There is moderate concentric left ventricular hypertrophy. Left ventricular diastolic parameters are indeterminate. Right Ventricle: The right ventricular size  is severely enlarged. No increase in right ventricular wall thickness. Right ventricular systolic function is mildly reduced. There is moderately elevated pulmonary artery systolic pressure. The tricuspid regurgitant velocity is 3.15 m/s, and with an assumed right atrial pressure of 15 mmHg, the estimated right ventricular systolic pressure is 00.1 mmHg. Left Atrium: Left atrial size was severely dilated. Right Atrium: Right atrial size was severely dilated. Pericardium: There is no evidence of pericardial effusion. Mitral Valve: There is a secondary MR with multiple jets from ventricular dilation (predominantley at the P2 position) multuple jets; only blunted PV. The mitral valve is grossly normal. Severe mitral valve regurgitation. Tricuspid Valve: The tricuspid valve is abnormal. Tricuspid valve regurgitation is severe. Aortic Valve: The aortic valve is tricuspid. There is mild calcification of the aortic valve. Aortic valve regurgitation is not visualized. Aortic valve sclerosis is present, with no evidence of aortic valve stenosis. Aortic valve mean gradient measures 3.3 mmHg. Aortic valve peak gradient measures 6.2 mmHg. Aortic valve area, by VTI measures 2.44 cm. Pulmonic Valve: The pulmonic valve was grossly normal. Pulmonic valve regurgitation is not visualized. No evidence of pulmonic stenosis. Aorta: The aortic root and ascending aorta are structurally normal, with no evidence of dilitation. Venous: The inferior vena cava is dilated in size with less than 50% respiratory variability, suggesting right atrial pressure of 15 mmHg. IAS/Shunts: The atrial septum is grossly normal.  LEFT VENTRICLE PLAX 2D LVIDd:         6.20 cm  Diastology LVIDs:         4.60 cm      LV e' medial:    10.76 cm/s LV PW:         1.20 cm      LV E/e' medial:  9.8 LV IVS:        1.20 cm      LV e' lateral:   9.86 cm/s LVOT diam:     2.10 cm      LV E/e' lateral: 10.7 LV SV:         56 LV SV Index:   31 LVOT Area:     3.46 cm   LV Volumes (MOD) LV vol d, MOD A2C: 130.0 ml LV vol d, MOD A4C: 161.0 ml LV vol s, MOD A2C: 74.9 ml LV vol s, MOD A4C: 95.6 ml LV SV MOD A2C:     55.1 ml LV SV MOD A4C:     161.0 ml LV SV MOD BP:      60.0 ml RIGHT VENTRICLE             IVC RV Basal diam:  4.90 cm     IVC diam: 3.10 cm RV S prime:     13.43 cm/s LEFT ATRIUM              Index        RIGHT ATRIUM           Index LA diam:        6.10 cm  3.35 cm/m   RA Area:     30.70 cm LA Vol (A2C):   135.0 ml 74.12 ml/m  RA Volume:   105.00 ml 57.65 ml/m LA Vol (A4C):   141.0 ml 77.42 ml/m LA Biplane Vol: 145.0 ml 79.61 ml/m  AORTIC VALVE                    PULMONIC VALVE AV Area (Vmax):    2.28 cm     PV Vmax:       1.41 m/s AV Area (Vmean):   2.06 cm     PV Peak grad:  8.0 mmHg AV Area (VTI):     2.44 cm AV Vmax:           125.00 cm/s AV Vmean:          87.933 cm/s AV VTI:            0.231 m AV Peak Grad:      6.2 mmHg AV Mean Grad:      3.3 mmHg LVOT Vmax:         82.20 cm/s LVOT Vmean:        52.267 cm/s LVOT VTI:          0.163 m LVOT/AV VTI ratio: 0.70  AORTA Ao Root diam: 3.70 cm Ao Asc diam:  3.40 cm MITRAL VALVE                  TRICUSPID VALVE MV Area (PHT): 4.78 cm       TR Peak grad:   39.7 mmHg MV Decel Time: 159 msec       TR Vmax:        315.00 cm/s MR Peak grad:    99.6 mmHg MR Mean grad:    68.0 mmHg    SHUNTS MR Vmax:         499.00 cm/s  Systemic VTI:  0.16 m MR Vmean:  393.7 cm/s   Systemic Diam: 2.10 cm MR PISA:         2.26 cm MR PISA Eff ROA: 17 mm MR PISA Radius:  0.60 cm MV E velocity: 105.00 cm/s Rudean Haskell MD Electronically signed by Rudean Haskell MD Signature Date/Time: 05/23/2021/12:01:17 PM    Final    IR TUNNELED CENTRAL VENOUS CATHETER PLACEMENT  Result Date: 05/23/2021 INDICATION: 65 year old male with end-stage renal disease requiring central venous access for nutrition supplementation. EXAM: 1. Ultrasound-guided venipuncture of the jugular vein 2. Fluoroscopic guided placement of tunneled  central venous catheter MEDICATIONS: None. ANESTHESIA/SEDATION: Local anesthesia only. FLUOROSCOPY TIME:  0 minutes 48 seconds (3 mGy). COMPLICATIONS: None immediate. PROCEDURE: Informed written consent was obtained from the patient after a discussion of the risks, benefits, and alternatives to treatment. Questions regarding the procedure were encouraged and answered. The right neck and chest were prepped with chlorhexidine in a sterile fashion, and a sterile drape was applied covering the operative field. Maximum barrier sterile technique with sterile gowns and gloves were used for the procedure. A timeout was performed prior to the initiation of the procedure. After creating a small venotomy incision, a 21 gauge micropuncture kit was utilized to access the internal jugular vein. Real-time ultrasound guidance was utilized for vascular access including the acquisition of a permanent ultrasound image documenting patency of the accessed vessel. A Mandril wire to the level of the cavoatrial junction. A 6 French tunneled central venous catheter measuring 25 cm from tip to cuff was tunneled in a retrograde fashion from the anterior chest wall to the venotomy incision. A peel-away sheath was placed over the wire. The catheter was then placed through the peel-away sheath with the catheter tip ultimately positioned at the cavoatrial junction. Final catheter positioning was confirmed and documented with a spot radiographic image. The catheter aspirates and flushes normally. The catheter was flushed with appropriate volume heparin dwells. The catheter exit site was secured with a 0-Prolene retention suture. The venotomy incision was closed with Dermabond. Sterile dressings were applied. The patient tolerated the procedure well without immediate post procedural complication. IMPRESSION: Successful placement of 25 cm tip to cuff tunneled 6 French central venous catheter via the right internal jugular vein with catheter tip  terminating at the cavoatrial junction. The catheter is ready for immediate use. Ruthann Cancer, MD [facility] Electronically Signed   By: Ruthann Cancer M.D.   On: 05/23/2021 12:30    Scheduled Meds:  [START ON 05/27/2021] darbepoetin (ARANESP) injection - DIALYSIS  200 mcg Intravenous Q Sat-HD   fluticasone furoate-vilanterol  1 puff Inhalation Daily   And   umeclidinium bromide  1 puff Inhalation Daily   [START ON 05/24/2021] insulin aspart  0-6 Units Subcutaneous Q6H   pantoprazole (PROTONIX) IV  40 mg Intravenous Q12H   Continuous Infusions:  amiodarone 30 mg/hr (05/23/21 1354)   TPN ADULT (ION)      LOS: 4 days   Kerney Elbe, DO [facility] PAGER is on [place]  If 7PM-7AM, please contact night-coverage [email or web address]

## 2021-05-23 NOTE — Progress Notes (Addendum)
Advanced Heart Failure Rounding Note  PCP-Cardiologist: Dr. Haroldine Laws   Subjective:    V-rates better on amio gtt, now 90s-low 100s.   Still awaiting echo.   Denies Chest pain. No dyspnea or palpitations.  C/w NGT. No BM yet. Not passing flatus. Denies severe abdominal pain.    Objective:   Weight Range: 64.3 kg Body mass index is 19.76 kg/m.   Vital Signs:   Temp:  [97.3 F (36.3 C)-97.6 F (36.4 C)] 97.4 F (36.3 C) (12/06 0758) Pulse Rate:  [98-106] 100 (12/06 0415) Resp:  [15-20] 20 (12/06 0415) BP: (106-122)/(74-81) 108/80 (12/06 0415) SpO2:  [97 %-100 %] 100 % (12/06 0415) Weight:  [64.3 kg] 64.3 kg (12/06 0415) Last BM Date: 05/18/21  Weight change: Filed Weights   05/21/21 0450 05/22/21 0419 05/23/21 0415  Weight: 67.3 kg 67.3 kg 64.3 kg    Intake/Output:   Intake/Output Summary (Last 24 hours) at 05/23/2021 0806 Last data filed at 05/23/2021 0148 Gross per 24 hour  Intake 102.51 ml  Output 875 ml  Net -772.49 ml      Physical Exam    General:  chronically ill appearing, frail. No resp difficulty HEENT: blind, + NGT  Neck: Supple. JVP to jaw . Carotids 2+ bilat; no bruits. No lymphadenopathy or thyromegaly appreciated. Cor: PMI nondisplaced. Irregularly irregular rhythm and rate. No rubs, gallops or murmurs. Lungs: Clear Abdomen: mildly distended, hypoactive BS. No hepatosplenomegaly. No bruits or masses.  Extremities: No cyanosis, clubbing, rash, edema Neuro: Alert & orientedx3, cranial nerves grossly intact. moves all 4 extremities w/o difficulty. Affect pleasant   Telemetry   Afib, 90s-low 100s  EKG    No new EKG to review   Labs    CBC Recent Labs    05/22/21 0252 05/23/21 0131  WBC 7.2 9.0  NEUTROABS 6.0 6.6  HGB 7.6* 7.8*  HCT 21.0* 22.5*  MCV 96.8 100.0  PLT 176 951   Basic Metabolic Panel Recent Labs    05/22/21 0252 05/23/21 0131  NA 135 136  K 3.9 3.9  CL 95* 95*  CO2 27 27  GLUCOSE 103* 160*  BUN 59*  75*  CREATININE 4.85* 5.89*  CALCIUM 8.2* 8.6*  MG 2.2 2.4  PHOS 5.8* 5.3*   Liver Function Tests Recent Labs    05/22/21 0252 05/23/21 0131  AST 27 33  ALT 17 16  ALKPHOS 152* 168*  BILITOT 2.1* 2.0*  PROT 5.1* 5.9*  ALBUMIN 2.1* 2.4*   No results for input(s): LIPASE, AMYLASE in the last 72 hours. Cardiac Enzymes No results for input(s): CKTOTAL, CKMB, CKMBINDEX, TROPONINI in the last 72 hours.  BNP: BNP (last 3 results) Recent Labs    08/01/20 2047  BNP 2,730.5*    ProBNP (last 3 results) No results for input(s): PROBNP in the last 8760 hours.   D-Dimer No results for input(s): DDIMER in the last 72 hours. Hemoglobin A1C No results for input(s): HGBA1C in the last 72 hours. Fasting Lipid Panel No results for input(s): CHOL, HDL, LDLCALC, TRIG, CHOLHDL, LDLDIRECT in the last 72 hours. Thyroid Function Tests No results for input(s): TSH, T4TOTAL, T3FREE, THYROIDAB in the last 72 hours.  Invalid input(s): FREET3  Other results:   Imaging    DG Abd Portable 1V  Result Date: 05/22/2021 CLINICAL DATA:  Abdominal pain and distention. EXAM: PORTABLE ABDOMEN - 1 VIEW COMPARISON:  May 21, 2021. FINDINGS: No abnormal bowel dilatation is noted. Biliary stent is noted in grossly good position. Distal  tip of nasogastric tube is seen in expected position of proximal stomach. Stool is noted throughout the colon. Cholelithiasis is noted. IMPRESSION: Distal tip of nasogastric tube seen in expected position of proximal stomach. Biliary stent is in grossly good position. No abnormal bowel dilatation is noted. Cholelithiasis. Large stool burden is noted. Electronically Signed   By: Marijo Conception M.D.   On: 05/22/2021 10:16     Medications:     Scheduled Medications:  fluticasone furoate-vilanterol  1 puff Inhalation Daily   And   umeclidinium bromide  1 puff Inhalation Daily   pantoprazole (PROTONIX) IV  40 mg Intravenous Q12H    Infusions:  amiodarone 30 mg/hr  (05/23/21 0230)    PRN Medications: acetaminophen **OR** acetaminophen, albuterol, HYDROmorphone (DILAUDID) injection, naLOXone (NARCAN)  injection, ondansetron (ZOFRAN) IV    Patient Profile   65 y/o chronically ill male with sickle cell dz, ESRD, chronic systolic HF EF 11%, pulmonary HTN, chronic AF and legal blindness. Admitted with recurrent SBO. We are asked to help manage AF with RVR and assess peri-operative risk.   Assessment/Plan    1. Persistent Atrial Fibrillation/AFL - S/p DCCV 05/2018 - S/P DCCV 10/06/19 NSR - Recurrence 05/21. Has been seen in AF Clinic and rate control strategy recommended - now on amio gtt for rate control in setting of SBO/NPO status  - Holding anticoagulation d/t a/c anemia. Concern about risks of recurrent bleeding with anticoagulation. See below   2. Chronic combined Systolic/Diastolic Heart Failure:  - NICM. Cath 05/2011 without significant coronary disease.   - ECHO 12/2015 40%.  - Echo 05/2018: EF 25-30%.  - Echo 3/21 LVEF 35-40%, low normal RV function,severe pulm HTN. Severe LAE, mod RAE, mod MR, severe TR. RV TAPSE 1.6, RV s' 10 - Echo 9/21 EF 40-45%, RV mildly reduced, mild RAE, severe pulm artery systolic pressure, severe TR - NYHA III at baseline.  Appears slightly volume up. JVP to jaw in setting of severe TR. Volume managed with HD on T,T,S + takes 80 mg po torsemide at home (No po diuretic this admit) - GDMT including coreg and hydralazine/imdur on hold. BP soft.  - Off Entresto with ESRD - Repeat echo   3. Anemia with H/o GI bleeding: - Prior EGD in 2019 showed nonbleeding gastritis, Colonoscopy showed small internal hemorrhoids, otherwise normal.  - GI bleed 02/22 - gastric erosions on EGD - A/C anemia 11/22 - transfused with 3 uPRBCs - A/C anemia s/p 2 u PRBCs this admit. Hgb 7.8 today. Baseline hgb 8-9 - Received aranesp - Holding anticoagulation. May need to weigh risks/benefits of resuming anticoagulation with hx recurrent  bleeding. Not sure if he would be a good candidate for LAA occlusion with Watchman device   4. SBO - CT 12/04 w/ large amount of upper abdominal ascites and SBO - Currently NPO. Has NG tube - putting out dark fluid - Bili improved 3.1>2.1 - AST and ALT okay - General surgery and GI following  - Potentially going for surgery  - Can continue IV amio for rate control perioperatively. - He is at increased risk for surgery but not prohibitive. - Will obtain echo to reassess LV function/EF and PA pressures   5. Choledocholithiasis - S/p ERCP w/ stone extraction + placement of common bile duct and pancreatic duct stents in 11/22 -Planning for ERCP and stent removal in 3 weeks   6. Pulmonary HTN  - Last echo 09/21 with EF 40-45%, RV mildly reduced, RVSP 75 mmHg, severe TR -  Repeat echo today to reassess EF and pulmonary pressures - If he has progressive PAH may warrant empiric inotrope support versus R/L cath prior to OR in an attempt to optimize hemodynamically.   7. Abdominal ascites -Noted on CT -Mild hepatic nodularity present   8. HTN: - BP soft - Home medications held   9.  Sickle Cell:   - Follows closely with [facility].    73. Former smoker:   - Quit 11/2015.   11. ESRD - On HD now T/T/S - Nephrology following   12. Perioperative Risk Assessment  - He is very tenuous and very high risk for any surgery. That said, given recurrent/refractory SBO, he may not have any other options. In order to proceed with surgery (particularly a laparotomy), Given his PAH, LV dysfunction and poor functional capacity, I think he would have to be willing to accept a fairly significant operative mortality risk to proceed.  - We will check a repeat echo to reassess EF and pulmonary pressures to see if this changes anything. If he has progressive PAH may warrant empiric inotrope support versus R/L cath prior to OR in an attempt to optimize hemodynamically.     Length of Stay:  [address], PA-C  05/23/2021, 8:06 AM  Advanced Heart Failure Team Pager [phone] (M-F; 7a - 5p)  Please contact [facility] for night-coverage after hours (5p -7a ) and weekends on [email or web address]  Patient seen and examined with the above-signed Advanced Practice Provider and/or Housestaff. I personally reviewed laboratory data, imaging studies and relevant notes. I independently examined the patient and formulated the important aspects of the plan. I have edited the note to reflect any of my changes or salient points. I have personally discussed the plan with the patient and/or family.  Still with SBO and significant NGT output. AF rate improved with IV amio  Echo EF 40-45% Mild RV dysfunction. Severe biatrial enlargement with severe MR/TR  General:  Weak appearing. No resp difficulty HEENT: normal Neck: supple. JVP up Carotids 2+ bilat; no bruits. No lymphadenopathy or thryomegaly appreciated. Cor: PMI nondisplaced. Irregular rate & rhythm. No rubs, gallops or murmurs. Lungs: clear Abdomen: soft, nontender, + distended. No hepatosplenomegaly. No bruits or masses. + hypoactive BS Extremities: no cyanosis, clubbing, rash, edema Neuro: alert & orientedx3, cranial nerves grossly intact. moves all 4 extremities w/o difficulty. Affect pleasant  SBO persists and appears likely to need surgery.   Echo suggestive of restrictive CM with severe MR/TR. I doubt we can do much to lower his operative CV risk in this setting.   Ok to proceed to surgery from my perspective as long as he understands the peri-operative complications (including death) are not trivial.   Glori Bickers, MD  12:49 PM

## 2021-05-23 NOTE — Progress Notes (Signed)
Pt receives out-pt HD at [facility] AF on TTS. Pt needs to arrive at 5:30 for 5:45 chair time. Noted pt is for possible snf placement. Will assist as needed.  Melven Sartorius Renal Navigator [phone]

## 2021-05-23 NOTE — TOC Initial Note (Signed)
Transition of Care [facility]) - Initial/Assessment Note    Patient Details  Name: Patrick [name] MRN: [id number] Date of Birth: 03-17-1956  Transition of Care [facility]) CM/SW Contact:    Milas Gain, [place] Phone Number: 05/23/2021, 10:59 AM  Clinical Narrative:                  CSW received consult for possible SNF placement at time of discharge. CSW spoke with patient at bedside regarding PT recommendation of SNF placement at time of discharge. Patient reports he comes from home alone.Patient expressed understanding of PT recommendation and is agreeable to SNF placement at time of discharge. Patient gave CSW permission to fax out initial referral near the [place] area. CSW discussed insurance authorization process with patient.Patient reports he has received the COVID vaccines as well as 1 booster. Patient is HD TTS.No further questions reported at this time. CSW to continue to follow and assist with discharge planning needs.   Expected Discharge Plan: Skilled Nursing Facility Barriers to Discharge: Continued Medical Work up   Patient Goals and CMS Choice Patient states their goals for this hospitalization and ongoing recovery are:: SNF CMS Medicare.gov Compare Post Acute Care list provided to:: Patient Choice offered to / list presented to : Patient  Expected Discharge Plan and Services Expected Discharge Plan: [place] In-house Referral: Clinical Social Work     Living arrangements for the past 2 months: Single Family Home                                      Prior Living Arrangements/Services Living arrangements for the past 2 months: Single Family Home Lives with:: Self Patient language and need for interpreter reviewed:: Yes Do you feel safe going back to the place where you live?: No   SNF  Need for Family Participation in Patient Care: Yes (Comment) Care giver support system in place?: Yes (comment)   Criminal Activity/Legal Involvement  Pertinent to Current Situation/Hospitalization: No - Comment as needed  Activities of Daily Living Home Assistive Devices/Equipment: None ADL Screening (condition at time of admission) Patient's cognitive ability adequate to safely complete daily activities?: Yes Is the patient deaf or have difficulty hearing?: No Does the patient have difficulty seeing, even when wearing glasses/contacts?: Yes Does the patient have difficulty concentrating, remembering, or making decisions?: No Patient able to express need for assistance with ADLs?: Yes Does the patient have difficulty dressing or bathing?: Yes Independently performs ADLs?: No Communication: Independent Dressing (OT): Needs assistance Is this a change from baseline?: Pre-admission baseline Grooming: Needs assistance Is this a change from baseline?: Pre-admission baseline Feeding: Needs assistance Is this a change from baseline?: Pre-admission baseline Bathing: Needs assistance Is this a change from baseline?: Pre-admission baseline Toileting: Needs assistance Is this a change from baseline?: Pre-admission baseline In/Out Bed: Needs assistance Is this a change from baseline?: Pre-admission baseline Walks in Home: Needs assistance Is this a change from baseline?: Pre-admission baseline Does the patient have difficulty walking or climbing stairs?: No Weakness of Legs: Both Weakness of Arms/Hands: None  Permission Sought/Granted Permission sought to share information with : Case Manager, Family Supports, Chartered certified accountant granted to share information with : Yes, Verbal Permission Granted  Share Information with NAME: Caren Griffins  Permission granted to share info w AGENCY: SNF  Permission granted to share info w Relationship: relative  Permission granted to share info w Contact Information: Caren Griffins  [phone]  Emotional Assessment Appearance:: Appears stated age Attitude/Demeanor/Rapport: Gracious Affect  (typically observed): Calm Orientation: : Oriented to Self, Oriented to Place, Oriented to  Time, Oriented to Situation Alcohol / Substance Use: Not Applicable Psych Involvement: No (comment)  Admission diagnosis:  SBO (small bowel obstruction) ([place]) [K56.609] Patient Active Problem List   Diagnosis Date Noted   SBO (small bowel obstruction) (HCC) 05/19/2021   Acute upper GI bleed 08/02/2020   Prolonged QT interval 08/02/2020   GI bleed 08/02/2020   Hematemesis with nausea    Chronic anticoagulation    Tobacco dependence 05/28/2020   Muscle spasm of back 76/22/6333   Chronic systolic CHF (congestive heart failure) ([place]) 09/11/2019   Hypoxia 06/02/2018   Symptomatic anemia    Chronic heart failure with preserved ejection fraction ([place])    Colon cancer screening 07/16/2017   Encounter for therapeutic drug monitoring 12/29/2015   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   Permanent atrial fibrillation ([place]) 12/24/2015   Cough with sputum 10/25/2015   Muscle tightness-Right arm  05/27/2014   Arm pain, right 05/06/2014   End stage renal disease ([place]) 05/06/2014   Chest pain 01/13/2014   Vitamin D deficiency 09/25/2013   CKD (chronic kidney disease), stage IV (HCC) 09/25/2013   Elevated uric acid in blood 09/25/2013   Hb-SS disease without crisis ([place]) 09/25/2013   Anemia 09/25/2013   Onychomycosis of toenail 08/27/2013   Cellulitis 08/27/2013   Hyperglycemia 08/27/2013   CHF (congestive heart failure) ([place]) 08/19/2013   Nausea & vomiting 02/07/2013   Sickle cell anemia ([place]) 11/21/2012   Abdominal pain 01/17/2012   NSVT (nonsustained ventricular tachycardia) 10/21/2011   Gout 10/19/2011   Sickle cell crisis ([place]) 10/17/2011   Acute on chronic systolic and diastolic heart failure, NYHA class 4 ([place]) 10/17/2011   Essential hypertension 06/07/2011   PCP:  Dorena Dew, FNP Pharmacy:   [id number] - , [place] [address] [place]  [address] Phone: [phone] Fax: [phone]     Social Determinants of Health (SDOH) Interventions    Readmission Risk Interventions Readmission Risk Prevention Plan 08/05/2020  Transportation Screening Complete  PCP or Specialist Appt within 3-5 Days Complete  HRI or Home Care Consult Complete  Social Work Consult for [place] Planning/Counseling Complete  Palliative Care Screening Not Applicable  Medication Review Press photographer) Complete  Some recent data might be hidden

## 2021-05-23 NOTE — Procedures (Signed)
Interventional Radiology Procedure Note  Procedure: Image guided tunneled central venous catheter placement  Findings: Please refer to procedural dictation for full description. 6 Fr, 25 cm tunneled dual lumen central venous catheter, tip at cavoatrial junction.    Complications: None immediate  Estimated Blood Loss: < 5 mL  Recommendations: Catheter ready for immediate use.   Ruthann Cancer, MD Pager: [phone]

## 2021-05-23 NOTE — Progress Notes (Signed)
Progress Note     Subjective: No new complaints.  Denies abdominal pain.  No flatus or BM except with enema yesterday.  Still with 400cc of NGT output.  Objective: Vital signs in last 24 hours: Temp:  [97.3 F (36.3 C)-97.6 F (36.4 C)] 97.4 F (36.3 C) (12/06 0758) Pulse Rate:  [98-106] 100 (12/06 0415) Resp:  [15-20] 20 (12/06 0415) BP: (106-122)/(74-81) 108/80 (12/06 0415) SpO2:  [97 %-100 %] 100 % (12/06 0415) Weight:  [64.3 kg] 64.3 kg (12/06 0415) Last BM Date: 05/18/21  Intake/Output from previous day: 12/05 0701 - 12/06 0700 In: 102.5 [I.V.:102.5] Out: 875 [Urine:475; Emesis/NG output:400] Intake/Output this shift: No intake/output data recorded.  PE: General: pleasant, NAD Abd: soft, NT on palpation, milddistention, +BS, NGT with reddish brown drainage   Lab Results:  Recent Labs    05/22/21 0252 05/23/21 0131  WBC 7.2 9.0  HGB 7.6* 7.8*  HCT 21.0* 22.5*  PLT 176 193   BMET Recent Labs    05/22/21 0252 05/23/21 0131  NA 135 136  K 3.9 3.9  CL 95* 95*  CO2 27 27  GLUCOSE 103* 160*  BUN 59* 75*  CREATININE 4.85* 5.89*  CALCIUM 8.2* 8.6*   PT/INR No results for input(s): LABPROT, INR in the last 72 hours.  CMP     Component Value Date/Time   NA 136 05/23/2021 0131   NA 135 05/24/2020 1119   K 3.9 05/23/2021 0131   CL 95 (L) 05/23/2021 0131   CO2 27 05/23/2021 0131   GLUCOSE 160 (H) 05/23/2021 0131   BUN 75 (H) 05/23/2021 0131   BUN 25 05/24/2020 1119   CREATININE 5.89 (H) 05/23/2021 0131   CREATININE 2.90 (H) 08/11/2015 1359   CALCIUM 8.6 (L) 05/23/2021 0131   PROT 5.9 (L) 05/23/2021 0131   PROT 7.5 05/24/2020 1119   ALBUMIN 2.4 (L) 05/23/2021 0131   ALBUMIN 4.1 05/24/2020 1119   AST 33 05/23/2021 0131   ALT 16 05/23/2021 0131   ALKPHOS 168 (H) 05/23/2021 0131   BILITOT 2.0 (H) 05/23/2021 0131   BILITOT 2.4 (H) 05/24/2020 1119   GFRNONAA 10 (L) 05/23/2021 0131   GFRNONAA 23 (L) 08/11/2015 1359   GFRAA 24 (L) 05/24/2020 1119    GFRAA 27 (L) 08/11/2015 1359   Lipase     Component Value Date/Time   LIPASE 51 05/19/2021 1445       Studies/Results: DG Abd Portable 1V  Result Date: 05/22/2021 CLINICAL DATA:  Abdominal pain and distention. EXAM: PORTABLE ABDOMEN - 1 VIEW COMPARISON:  May 21, 2021. FINDINGS: No abnormal bowel dilatation is noted. Biliary stent is noted in grossly good position. Distal tip of nasogastric tube is seen in expected position of proximal stomach. Stool is noted throughout the colon. Cholelithiasis is noted. IMPRESSION: Distal tip of nasogastric tube seen in expected position of proximal stomach. Biliary stent is in grossly good position. No abnormal bowel dilatation is noted. Cholelithiasis. Large stool burden is noted. Electronically Signed   By: Marijo Conception M.D.   On: 05/22/2021 10:16    Anti-infectives: Anti-infectives (From admission, onward)    None        Assessment/Plan Choledocholithiasis s/p ERCP, declined laparoscopic cholecystectomy at [facility] - LFTs overall stable - GI following as well, plan [facility] for [facility] removal in about 3 weeks with repeat ERCP SBO - CT 12/2 and yesterday with fluid-filled small bowel, CT yesterday with increased ascited - NGT with dark reddish brown output - pt denies  passing flatus or BM except with enema - repeat KUB with still persistently dilated small bowel -patient needs an operation as this is unlikely to resolve without.  Noted to be very high risk from cardiology standpoint -given prolonged NPO status, will have IR place an IJ line today so TNA can be started for nutritional support  FEN: NPO, NGT to LIWS, TNA VTE: SCDs, ok to have SQH or LMWH from a surgical standpoint ID: no current abx  ESRD - TTS CHF A. Fib on eliquis, on hold  LOS: 4 days    Patrick [name], [facility] Surgery 05/23/2021, 9:34 AM Please see Amion for pager number during day hours 7:00am-4:30pm

## 2021-05-23 NOTE — Progress Notes (Signed)
Initial Nutrition Assessment  DOCUMENTATION CODES:   Severe malnutrition in context of acute illness/injury  INTERVENTION:   TPN to meet 100% of estimated nutrition needs.  Monitor magnesium, potassium, and phosphorus BID for at least 3 days, MD to replete as needed, as pt is at risk for refeeding syndrome given severe malnutrition and recent weight loss.  NUTRITION DIAGNOSIS:   Severe Malnutrition related to acute illness (SBO) as evidenced by moderate fat depletion, moderate muscle depletion, percent weight loss (8% weight loss within 3 months).  GOAL:   Patient will meet greater than or equal to 90% of their needs  MONITOR:   Labs, I & O's, Diet advancement  REASON FOR ASSESSMENT:   Consult New TPN/TNA  ASSESSMENT:   65 yo male admitted with SBO. PMH includes ESRD on HD, recent SBO (hospitalized at [facility] in [place] 1/22-11/26), CHF, global hypokinesis, mitral regurgitation, legally blind, sickle cell disease.  Patient reports that he usually eats 3 meals per day and snacks. His intake has been decreased for the past several weeks d/t recent hospitalization for SBO and recurrent SBO. He receives Nepro supplements at his HD center, but he usually doesn't drink them because they give the supplement to him at the end of treatment and he doesn't remember to drink it or take it with him. He does not follow a special diet for HD, but he does monitor his weights to determine fluid loss/gain.   Weight history reviewed. Patient with 8% weight loss within the past 3 months, which is severe for the time frame.   TPN being initiated today. Patient is at risk for refeeding syndrome, given recent severe weight loss and severe malnutrition.   NGT in place to LIS. Patient wants to know when it can be removed.   Labs reviewed. Phos 5.3 Medications reviewed and include Aranesp, Novolog, Protonix.  Patient with severe malnutrition in the context of acute illness. Suspect there  is a component of chronic malnutrition as well.   NUTRITION - FOCUSED PHYSICAL EXAM:  Flowsheet Row Most Recent Value  Orbital Region No depletion  Upper Arm Region Moderate depletion  Thoracic and Lumbar Region Moderate depletion  Buccal Region No depletion  Temple Region No depletion  Clavicle Bone Region Mild depletion  Clavicle and Acromion Bone Region Mild depletion  Scapular Bone Region Mild depletion  Dorsal Hand Moderate depletion  Patellar Region Moderate depletion  Anterior Thigh Region Moderate depletion  Posterior Calf Region Mild depletion  Edema (RD Assessment) None  Hair Reviewed  Eyes Unable to assess  Mouth Reviewed  Skin Reviewed  Nails Reviewed       Diet Order:   Diet Order             Diet NPO time specified  Diet effective now                   EDUCATION NEEDS:   Not appropriate for education at this time  Skin:  Skin Assessment: Reviewed RN Assessment (MASD to sacrum)  Last BM:  12/1  Height:   Ht Readings from Last 1 Encounters:  05/19/21 5\' 11"  (1.803 m)    Weight:   Wt Readings from Last 1 Encounters:  05/23/21 64.3 kg    BMI:  Body mass index is 19.76 kg/m.  Estimated Nutritional Needs:   Kcal:  1900-2100  Protein:  100-115 gm  Fluid:  1.9 L   Lucas Mallow, RD, LDN, CNSC Please refer to Amion for contact information.

## 2021-05-24 ENCOUNTER — Encounter (HOSPITAL_COMMUNITY)
Admission: EM | Disposition: A | Discharge status: Home Health | Payer: Self-pay | Source: Home / Self Care | Attending: Internal Medicine

## 2021-05-24 ENCOUNTER — Inpatient Hospital Stay (HOSPITAL_COMMUNITY): Payer: HMO | Admitting: Anesthesiology

## 2021-05-24 ENCOUNTER — Inpatient Hospital Stay (HOSPITAL_COMMUNITY): Payer: HMO

## 2021-05-24 DIAGNOSIS — I5022 Chronic systolic (congestive) heart failure: Secondary | ICD-10-CM | POA: Diagnosis not present

## 2021-05-24 DIAGNOSIS — J9602 Acute respiratory failure with hypercapnia: Secondary | ICD-10-CM

## 2021-05-24 DIAGNOSIS — J9601 Acute respiratory failure with hypoxia: Secondary | ICD-10-CM | POA: Diagnosis not present

## 2021-05-24 DIAGNOSIS — K56609 Unspecified intestinal obstruction, unspecified as to partial versus complete obstruction: Secondary | ICD-10-CM | POA: Diagnosis not present

## 2021-05-24 DIAGNOSIS — E43 Unspecified severe protein-calorie malnutrition: Secondary | ICD-10-CM

## 2021-05-24 DIAGNOSIS — R1084 Generalized abdominal pain: Secondary | ICD-10-CM | POA: Diagnosis not present

## 2021-05-24 DIAGNOSIS — N186 End stage renal disease: Secondary | ICD-10-CM | POA: Diagnosis not present

## 2021-05-24 HISTORY — PX: LAPAROSCOPY: SHX197

## 2021-05-24 HISTORY — PX: LAPAROTOMY: SHX154

## 2021-05-24 HISTORY — PX: CHOLECYSTECTOMY: SHX55

## 2021-05-24 HISTORY — PX: BOWEL RESECTION: SHX1257

## 2021-05-24 LAB — CBC WITH DIFFERENTIAL/PLATELET
Abs Immature Granulocytes: 0.08 10*3/uL — ABNORMAL HIGH (ref 0.00–0.07)
Basophils Absolute: 0.1 10*3/uL (ref 0.0–0.1)
Basophils Relative: 1 %
Eosinophils Absolute: 0.1 10*3/uL (ref 0.0–0.5)
Eosinophils Relative: 2 %
HCT: 21.9 % — ABNORMAL LOW (ref 39.0–52.0)
Hemoglobin: 7.5 g/dL — ABNORMAL LOW (ref 13.0–17.0)
Immature Granulocytes: 1 %
Lymphocytes Relative: 4 %
Lymphs Abs: 0.4 10*3/uL — ABNORMAL LOW (ref 0.7–4.0)
MCH: 34.2 pg — ABNORMAL HIGH (ref 26.0–34.0)
MCHC: 34.2 g/dL (ref 30.0–36.0)
MCV: 100 fL (ref 80.0–100.0)
Monocytes Absolute: 1.4 10*3/uL — ABNORMAL HIGH (ref 0.1–1.0)
Monocytes Relative: 16 %
Neutro Abs: 6.8 10*3/uL (ref 1.7–7.7)
Neutrophils Relative %: 76 %
Platelets: 174 10*3/uL (ref 150–400)
RBC: 2.19 MIL/uL — ABNORMAL LOW (ref 4.22–5.81)
RDW: 23.2 % — ABNORMAL HIGH (ref 11.5–15.5)
WBC: 8.8 10*3/uL (ref 4.0–10.5)
nRBC: 3.5 % — ABNORMAL HIGH (ref 0.0–0.2)

## 2021-05-24 LAB — CBC
HCT: 22.6 % — ABNORMAL LOW (ref 39.0–52.0)
Hemoglobin: 7.9 g/dL — ABNORMAL LOW (ref 13.0–17.0)
MCH: 32.5 pg (ref 26.0–34.0)
MCHC: 35 g/dL (ref 30.0–36.0)
MCV: 93 fL (ref 80.0–100.0)
Platelets: 145 10*3/uL — ABNORMAL LOW (ref 150–400)
RBC: 2.43 MIL/uL — ABNORMAL LOW (ref 4.22–5.81)
RDW: 23.7 % — ABNORMAL HIGH (ref 11.5–15.5)
WBC: 5.6 10*3/uL (ref 4.0–10.5)
nRBC: 3.7 % — ABNORMAL HIGH (ref 0.0–0.2)

## 2021-05-24 LAB — PHOSPHORUS: Phosphorus: 6.2 mg/dL — ABNORMAL HIGH (ref 2.5–4.6)

## 2021-05-24 LAB — BASIC METABOLIC PANEL
Anion gap: 11 (ref 5–15)
BUN: 21 mg/dL (ref 8–23)
CO2: 26 mmol/L (ref 22–32)
Calcium: 7.7 mg/dL — ABNORMAL LOW (ref 8.9–10.3)
Chloride: 100 mmol/L (ref 98–111)
Creatinine, Ser: 2.89 mg/dL — ABNORMAL HIGH (ref 0.61–1.24)
GFR, Estimated: 24 mL/min — ABNORMAL LOW (ref >60)
Glucose, Bld: 140 mg/dL — ABNORMAL HIGH (ref 70–99)
Potassium: 3.3 mmol/L — ABNORMAL LOW (ref 3.5–5.1)
Sodium: 137 mmol/L (ref 135–145)

## 2021-05-24 LAB — PROTIME-INR
INR: 1.2 (ref 0.8–1.2)
Prothrombin Time: 15.6 seconds — ABNORMAL HIGH (ref 11.4–15.2)

## 2021-05-24 LAB — GLUCOSE, CAPILLARY
Glucose-Capillary: 100 mg/dL — ABNORMAL HIGH (ref 70–99)
Glucose-Capillary: 103 mg/dL — ABNORMAL HIGH (ref 70–99)
Glucose-Capillary: 128 mg/dL — ABNORMAL HIGH (ref 70–99)
Glucose-Capillary: 170 mg/dL — ABNORMAL HIGH (ref 70–99)

## 2021-05-24 LAB — POCT I-STAT 7, (LYTES, BLD GAS, ICA,H+H)
Acid-Base Excess: 9 mmol/L — ABNORMAL HIGH (ref 0.0–2.0)
Bicarbonate: 33 mmol/L — ABNORMAL HIGH (ref 20.0–28.0)
Calcium, Ion: 1.08 mmol/L — ABNORMAL LOW (ref 1.15–1.40)
HCT: 23 % — ABNORMAL LOW (ref 39.0–52.0)
Hemoglobin: 7.8 g/dL — ABNORMAL LOW (ref 13.0–17.0)
O2 Saturation: 100 %
Patient temperature: 98.1
Potassium: 3.4 mmol/L — ABNORMAL LOW (ref 3.5–5.1)
Sodium: 139 mmol/L (ref 135–145)
TCO2: 34 mmol/L — ABNORMAL HIGH (ref 22–32)
pCO2 arterial: 40.1 mmHg (ref 32.0–48.0)
pH, Arterial: 7.523 — ABNORMAL HIGH (ref 7.350–7.450)
pO2, Arterial: 571 mmHg — ABNORMAL HIGH (ref 83.0–108.0)

## 2021-05-24 LAB — POCT I-STAT, CHEM 8
BUN: 21 mg/dL (ref 8–23)
Calcium, Ion: 1.11 mmol/L — ABNORMAL LOW (ref 1.15–1.40)
Chloride: 97 mmol/L — ABNORMAL LOW (ref 98–111)
Creatinine, Ser: 2.7 mg/dL — ABNORMAL HIGH (ref 0.61–1.24)
Glucose, Bld: 129 mg/dL — ABNORMAL HIGH (ref 70–99)
HCT: 22 % — ABNORMAL LOW (ref 39.0–52.0)
Hemoglobin: 7.5 g/dL — ABNORMAL LOW (ref 13.0–17.0)
Potassium: 3.5 mmol/L (ref 3.5–5.1)
Sodium: 138 mmol/L (ref 135–145)
TCO2: 32 mmol/L (ref 22–32)

## 2021-05-24 LAB — COMPREHENSIVE METABOLIC PANEL
ALT: 18 U/L (ref 0–44)
AST: 35 U/L (ref 15–41)
Albumin: 2.4 g/dL — ABNORMAL LOW (ref 3.5–5.0)
Alkaline Phosphatase: 163 U/L — ABNORMAL HIGH (ref 38–126)
Anion gap: 14 (ref 5–15)
BUN: 81 mg/dL — ABNORMAL HIGH (ref 8–23)
CO2: 29 mmol/L (ref 22–32)
Calcium: 8.6 mg/dL — ABNORMAL LOW (ref 8.9–10.3)
Chloride: 95 mmol/L — ABNORMAL LOW (ref 98–111)
Creatinine, Ser: 7.06 mg/dL — ABNORMAL HIGH (ref 0.61–1.24)
GFR, Estimated: 8 mL/min — ABNORMAL LOW (ref >60)
Glucose, Bld: 108 mg/dL — ABNORMAL HIGH (ref 70–99)
Potassium: 3.9 mmol/L (ref 3.5–5.1)
Sodium: 138 mmol/L (ref 135–145)
Total Bilirubin: 2.3 mg/dL — ABNORMAL HIGH (ref 0.3–1.2)
Total Protein: 6 g/dL — ABNORMAL LOW (ref 6.5–8.1)

## 2021-05-24 LAB — MAGNESIUM: Magnesium: 2.6 mg/dL — ABNORMAL HIGH (ref 1.7–2.4)

## 2021-05-24 LAB — PREPARE RBC (CROSSMATCH)

## 2021-05-24 LAB — MRSA NEXT GEN BY PCR, NASAL: MRSA by PCR Next Gen: NOT DETECTED

## 2021-05-24 SURGERY — LAPAROSCOPY, DIAGNOSTIC
Anesthesia: General | Site: Abdomen

## 2021-05-24 MED ORDER — POLYETHYLENE GLYCOL 3350 17 G PO PACK: 17.0000 g | PACK | Freq: Every day | ORAL | Status: DC

## 2021-05-24 MED ORDER — MIDAZOLAM HCL 2 MG/2ML IJ SOLN: 1.0000 mg | Freq: Once | INTRAMUSCULAR | Status: AC

## 2021-05-24 MED ORDER — EPHEDRINE 5 MG/ML INJ
INTRAVENOUS | Status: AC
  Filled 2021-05-24: qty 10

## 2021-05-24 MED ORDER — FENTANYL CITRATE (PF) 100 MCG/2ML IJ SOLN: 25.0000 ug | INTRAMUSCULAR | Status: DC | PRN

## 2021-05-24 MED ORDER — ARFORMOTEROL TARTRATE 15 MCG/2ML IN NEBU
15.0000 ug | INHALATION_SOLUTION | Freq: Two times a day (BID) | RESPIRATORY_TRACT | Status: DC
  Administered 2021-05-24 – 2021-06-09 (×26): 15 ug via RESPIRATORY_TRACT
  Filled 2021-05-24 (×33): qty 2

## 2021-05-24 MED ORDER — DEXAMETHASONE SODIUM PHOSPHATE 10 MG/ML IJ SOLN
INTRAMUSCULAR | Status: DC | PRN
  Administered 2021-05-24: 10 mg via INTRAVENOUS

## 2021-05-24 MED ORDER — BUDESONIDE 0.25 MG/2ML IN SUSP
0.2500 mg | Freq: Two times a day (BID) | RESPIRATORY_TRACT | Status: DC
  Administered 2021-05-24 – 2021-06-09 (×26): 0.25 mg via RESPIRATORY_TRACT
  Filled 2021-05-24 (×33): qty 2

## 2021-05-24 MED ORDER — MIDAZOLAM HCL 2 MG/2ML IJ SOLN
INTRAMUSCULAR | Status: AC
  Filled 2021-05-24: qty 2

## 2021-05-24 MED ORDER — VASOPRESSIN 20 UNIT/ML IV SOLN
INTRAVENOUS | Status: DC | PRN
  Administered 2021-05-24: 1 [IU] via INTRAVENOUS

## 2021-05-24 MED ORDER — CEFAZOLIN SODIUM 1 G IJ SOLR
INTRAMUSCULAR | Status: AC
  Filled 2021-05-24: qty 20

## 2021-05-24 MED ORDER — VASOPRESSIN 20 UNITS/100 ML INFUSION FOR SHOCK
0.0000 [IU]/min | INTRAVENOUS | Status: DC
  Administered 2021-05-24: .03 [IU]/h via INTRAVENOUS
  Administered 2021-05-25: 0.03 [IU]/min via INTRAVENOUS
  Filled 2021-05-24 (×2): qty 100

## 2021-05-24 MED ORDER — PHENYLEPHRINE HCL (PRESSORS) 10 MG/ML IV SOLN
INTRAVENOUS | Status: DC | PRN
  Administered 2021-05-24 (×4): 80 ug via INTRAVENOUS

## 2021-05-24 MED ORDER — SUCCINYLCHOLINE CHLORIDE 200 MG/10ML IV SOSY
PREFILLED_SYRINGE | INTRAVENOUS | Status: AC
  Filled 2021-05-24: qty 10

## 2021-05-24 MED ORDER — PHENYLEPHRINE HCL-NACL 20-0.9 MG/250ML-% IV SOLN
INTRAVENOUS | Status: DC | PRN
  Administered 2021-05-24: 50 ug/min via INTRAVENOUS

## 2021-05-24 MED ORDER — OXYCODONE HCL 5 MG/5ML PO SOLN: 5.0000 mg | Freq: Once | ORAL | Status: DC | PRN

## 2021-05-24 MED ORDER — TRACE MINERALS CU-MN-SE-ZN 300-55-60-3000 MCG/ML IV SOLN
INTRAVENOUS | Status: DC
  Filled 2021-05-24: qty 520

## 2021-05-24 MED ORDER — DOCUSATE SODIUM 50 MG/5ML PO LIQD: 100.0000 mg | Freq: Two times a day (BID) | ORAL | Status: DC

## 2021-05-24 MED ORDER — LIDOCAINE 2% (20 MG/ML) 5 ML SYRINGE
INTRAMUSCULAR | Status: DC | PRN
  Administered 2021-05-24: 60 mg via INTRAVENOUS

## 2021-05-24 MED ORDER — SODIUM CHLORIDE 0.9 % IV SOLN: INTRAVENOUS | Status: DC

## 2021-05-24 MED ORDER — LIDOCAINE 2% (20 MG/ML) 5 ML SYRINGE
INTRAMUSCULAR | Status: AC
  Filled 2021-05-24: qty 5

## 2021-05-24 MED ORDER — CHLORHEXIDINE GLUCONATE 0.12 % MT SOLN
OROMUCOSAL | Status: AC
  Administered 2021-05-24: 15 mL via OROMUCOSAL
  Filled 2021-05-24: qty 15

## 2021-05-24 MED ORDER — ACETAMINOPHEN 10 MG/ML IV SOLN
1000.0000 mg | Freq: Four times a day (QID) | INTRAVENOUS | Status: DC
  Administered 2021-05-24 – 2021-05-25 (×3): 1000 mg via INTRAVENOUS
  Filled 2021-05-24 (×4): qty 100

## 2021-05-24 MED ORDER — PROPOFOL 500 MG/50ML IV EMUL
INTRAVENOUS | Status: DC | PRN
  Administered 2021-05-24: 25 ug/kg/min via INTRAVENOUS

## 2021-05-24 MED ORDER — PIPERACILLIN-TAZOBACTAM IN DEX 2-0.25 GM/50ML IV SOLN
2.2500 g | Freq: Three times a day (TID) | INTRAVENOUS | Status: DC
  Administered 2021-05-24 – 2021-05-29 (×15): 2.25 g via INTRAVENOUS
  Filled 2021-05-24 (×19): qty 50

## 2021-05-24 MED ORDER — ROCURONIUM BROMIDE 10 MG/ML (PF) SYRINGE
PREFILLED_SYRINGE | INTRAVENOUS | Status: AC
  Filled 2021-05-24: qty 10

## 2021-05-24 MED ORDER — HEMOSTATIC AGENTS (NO CHARGE) OPTIME
TOPICAL | Status: DC | PRN
  Administered 2021-05-24: 2 via TOPICAL

## 2021-05-24 MED ORDER — CHLORHEXIDINE GLUCONATE 0.12 % MT SOLN: 15.0000 mL | Freq: Once | OROMUCOSAL | Status: AC

## 2021-05-24 MED ORDER — PHENYLEPHRINE 40 MCG/ML (10ML) SYRINGE FOR IV PUSH (FOR BLOOD PRESSURE SUPPORT)
PREFILLED_SYRINGE | INTRAVENOUS | Status: AC
  Filled 2021-05-24: qty 10

## 2021-05-24 MED ORDER — FENTANYL CITRATE (PF) 250 MCG/5ML IJ SOLN
INTRAMUSCULAR | Status: AC
  Filled 2021-05-24: qty 5

## 2021-05-24 MED ORDER — BUPIVACAINE-EPINEPHRINE 0.25% -1:200000 IJ SOLN
INTRAMUSCULAR | Status: DC | PRN
  Administered 2021-05-24: 6 mL

## 2021-05-24 MED ORDER — FENTANYL CITRATE (PF) 100 MCG/2ML IJ SOLN
50.0000 ug | INTRAMUSCULAR | Status: DC | PRN
  Filled 2021-05-24: qty 2

## 2021-05-24 MED ORDER — ROCURONIUM BROMIDE 10 MG/ML (PF) SYRINGE
PREFILLED_SYRINGE | INTRAVENOUS | Status: DC | PRN
  Administered 2021-05-24: 40 mg via INTRAVENOUS
  Administered 2021-05-24 (×2): 10 mg via INTRAVENOUS

## 2021-05-24 MED ORDER — DEXMEDETOMIDINE HCL IN NACL 400 MCG/100ML IV SOLN
0.4000 ug/kg/h | INTRAVENOUS | Status: DC
  Administered 2021-05-24: 0.4 ug/kg/h via INTRAVENOUS
  Administered 2021-05-25: 0.9 ug/kg/h via INTRAVENOUS
  Filled 2021-05-24: qty 100

## 2021-05-24 MED ORDER — 0.9 % SODIUM CHLORIDE (POUR BTL) OPTIME
TOPICAL | Status: DC | PRN
  Administered 2021-05-24: 1000 mL

## 2021-05-24 MED ORDER — PROPOFOL 10 MG/ML IV BOLUS
INTRAVENOUS | Status: DC | PRN
  Administered 2021-05-24: 100 mg via INTRAVENOUS

## 2021-05-24 MED ORDER — PROPOFOL 1000 MG/100ML IV EMUL
INTRAVENOUS | Status: AC
  Filled 2021-05-24: qty 100

## 2021-05-24 MED ORDER — CEFAZOLIN SODIUM-DEXTROSE 2-3 GM-%(50ML) IV SOLR
INTRAVENOUS | Status: DC | PRN
  Administered 2021-05-24: 2 g via INTRAVENOUS

## 2021-05-24 MED ORDER — ORAL CARE MOUTH RINSE: 15.0000 mL | Freq: Once | OROMUCOSAL | Status: AC

## 2021-05-24 MED ORDER — PHENYLEPHRINE HCL (PRESSORS) 10 MG/ML IV SOLN
INTRAVENOUS | Status: AC
  Filled 2021-05-24: qty 1

## 2021-05-24 MED ORDER — SUCCINYLCHOLINE CHLORIDE 200 MG/10ML IV SOSY
PREFILLED_SYRINGE | INTRAVENOUS | Status: DC | PRN
  Administered 2021-05-24: 100 mg via INTRAVENOUS

## 2021-05-24 MED ORDER — ORAL CARE MOUTH RINSE
15.0000 mL | OROMUCOSAL | Status: DC
  Administered 2021-05-24 – 2021-05-25 (×6): 15 mL via OROMUCOSAL

## 2021-05-24 MED ORDER — FENTANYL CITRATE (PF) 250 MCG/5ML IJ SOLN
INTRAMUSCULAR | Status: DC | PRN
  Administered 2021-05-24 (×2): 50 ug via INTRAVENOUS

## 2021-05-24 MED ORDER — TRACE MINERALS CU-MN-SE-ZN 300-55-60-3000 MCG/ML IV SOLN: INTRAVENOUS | Status: DC

## 2021-05-24 MED ORDER — MIDAZOLAM HCL 2 MG/2ML IJ SOLN
INTRAMUSCULAR | Status: AC
  Administered 2021-05-24: 1 mg via INTRAVENOUS
  Filled 2021-05-24: qty 2

## 2021-05-24 MED ORDER — PROPOFOL 10 MG/ML IV BOLUS
INTRAVENOUS | Status: AC
  Filled 2021-05-24: qty 20

## 2021-05-24 MED ORDER — METRONIDAZOLE 500 MG/100ML IV SOLN
500.0000 mg | Freq: Once | INTRAVENOUS | Status: AC
  Administered 2021-05-24: 500 mg via INTRAVENOUS
  Filled 2021-05-24: qty 100

## 2021-05-24 MED ORDER — ONDANSETRON HCL 4 MG/2ML IJ SOLN
INTRAMUSCULAR | Status: AC
  Filled 2021-05-24: qty 2

## 2021-05-24 MED ORDER — CHLORHEXIDINE GLUCONATE CLOTH 2 % EX PADS
6.0000 | MEDICATED_PAD | Freq: Every day | CUTANEOUS | Status: DC
  Administered 2021-05-24 – 2021-06-09 (×17): 6 via TOPICAL

## 2021-05-24 MED ORDER — SODIUM CHLORIDE 0.9% IV SOLUTION: Freq: Once | INTRAVENOUS | Status: AC

## 2021-05-24 MED ORDER — FENTANYL CITRATE (PF) 100 MCG/2ML IJ SOLN
50.0000 ug | INTRAMUSCULAR | Status: DC | PRN
  Administered 2021-05-24 – 2021-05-25 (×4): 100 ug via INTRAVENOUS
  Filled 2021-05-24 (×3): qty 2

## 2021-05-24 MED ORDER — ROPIVACAINE HCL 5 MG/ML IJ SOLN
INTRAMUSCULAR | Status: DC | PRN
  Administered 2021-05-24 (×2): 15 mL via PERINEURAL

## 2021-05-24 MED ORDER — OXYCODONE HCL 5 MG PO TABS: 5.0000 mg | ORAL_TABLET | Freq: Once | ORAL | Status: DC | PRN

## 2021-05-24 MED ORDER — FENTANYL CITRATE (PF) 100 MCG/2ML IJ SOLN
INTRAMUSCULAR | Status: AC
  Administered 2021-05-24: 50 ug via INTRAVENOUS
  Filled 2021-05-24: qty 2

## 2021-05-24 MED ORDER — LACTATED RINGERS IV BOLUS: 500.0000 mL | Freq: Once | INTRAVENOUS | Status: DC

## 2021-05-24 MED ORDER — EPHEDRINE SULFATE-NACL 50-0.9 MG/10ML-% IV SOSY
PREFILLED_SYRINGE | INTRAVENOUS | Status: DC | PRN
  Administered 2021-05-24: 5 mg via INTRAVENOUS

## 2021-05-24 MED ORDER — DEXAMETHASONE SODIUM PHOSPHATE 10 MG/ML IJ SOLN
INTRAMUSCULAR | Status: AC
  Filled 2021-05-24: qty 1

## 2021-05-24 MED ORDER — REVEFENACIN 175 MCG/3ML IN SOLN
175.0000 ug | Freq: Every day | RESPIRATORY_TRACT | Status: DC
  Administered 2021-05-25 – 2021-06-09 (×12): 175 ug via RESPIRATORY_TRACT
  Filled 2021-05-24 (×18): qty 3

## 2021-05-24 MED ORDER — BUPIVACAINE-EPINEPHRINE (PF) 0.25% -1:200000 IJ SOLN
INTRAMUSCULAR | Status: AC
  Filled 2021-05-24: qty 30

## 2021-05-24 MED ORDER — VASOPRESSIN 20 UNIT/ML IV SOLN
INTRAVENOUS | Status: AC
  Filled 2021-05-24: qty 1

## 2021-05-24 MED ORDER — SODIUM CHLORIDE 0.9 % IV BOLUS
500.0000 mL | Freq: Once | INTRAVENOUS | Status: AC
Start: 2021-05-24 — End: 2021-05-24
  Administered 2021-05-24: 500 mL via INTRAVENOUS

## 2021-05-24 MED ORDER — SODIUM CHLORIDE 0.9 % IV SOLN: INTRAVENOUS | Status: DC | PRN

## 2021-05-24 MED ORDER — FENTANYL CITRATE (PF) 100 MCG/2ML IJ SOLN: 50.0000 ug | Freq: Once | INTRAMUSCULAR | Status: AC

## 2021-05-24 MED ORDER — CHLORHEXIDINE GLUCONATE 0.12% ORAL RINSE (MEDLINE KIT)
15.0000 mL | Freq: Two times a day (BID) | OROMUCOSAL | Status: DC
  Administered 2021-05-24 – 2021-05-25 (×2): 15 mL via OROMUCOSAL

## 2021-05-24 SURGICAL SUPPLY — 72 items
APPLIER CLIP 5 13 M/L LIGAMAX5 (MISCELLANEOUS)
APPLIER CLIP ROT 10 11.4 M/L (STAPLE) ×3
BAG COUNTER SPONGE SURGICOUNT (BAG) ×2 IMPLANT
BAG SURGICOUNT SPONGE COUNTING (BAG) ×1
BIOPATCH RED 1 DISK 7.0 (GAUZE/BANDAGES/DRESSINGS) ×2 IMPLANT
BIOPATCH RED 1IN DISK 7.0MM (GAUZE/BANDAGES/DRESSINGS) ×1
BLADE CLIPPER SURG (BLADE) IMPLANT
BNDG GAUZE ELAST 4 BULKY (GAUZE/BANDAGES/DRESSINGS) ×3 IMPLANT
CANISTER SUCT 3000ML PPV (MISCELLANEOUS) ×6 IMPLANT
CHLORAPREP W/TINT 26 (MISCELLANEOUS) ×3 IMPLANT
CLIP APPLIE 5 13 M/L LIGAMAX5 (MISCELLANEOUS) IMPLANT
CLIP APPLIE ROT 10 11.4 M/L (STAPLE) ×1 IMPLANT
COVER SURGICAL LIGHT HANDLE (MISCELLANEOUS) ×3 IMPLANT
DERMABOND ADVANCED (GAUZE/BANDAGES/DRESSINGS)
DERMABOND ADVANCED .7 DNX12 (GAUZE/BANDAGES/DRESSINGS) IMPLANT
DRAIN CHANNEL 19F RND (DRAIN) ×3 IMPLANT
DRAPE LAPAROSCOPIC ABDOMINAL (DRAPES) ×3 IMPLANT
DRAPE WARM FLUID 44X44 (DRAPES) ×3 IMPLANT
DRSG OPSITE POSTOP 4X10 (GAUZE/BANDAGES/DRESSINGS) IMPLANT
DRSG OPSITE POSTOP 4X8 (GAUZE/BANDAGES/DRESSINGS) IMPLANT
DRSG PAD ABDOMINAL 8X10 ST (GAUZE/BANDAGES/DRESSINGS) ×6 IMPLANT
DRSG TEGADERM 4X4.5 CHG (GAUZE/BANDAGES/DRESSINGS) ×3 IMPLANT
ELECT BLADE 6.5 EXT (BLADE) IMPLANT
ELECT CAUTERY BLADE 6.4 (BLADE) ×3 IMPLANT
ELECT REM PT RETURN 9FT ADLT (ELECTROSURGICAL) ×3
ELECTRODE REM PT RTRN 9FT ADLT (ELECTROSURGICAL) ×1 IMPLANT
EVACUATOR SILICONE 100CC (DRAIN) ×3 IMPLANT
GLOVE SURG ENC MOIS LTX SZ7 (GLOVE) ×3 IMPLANT
GLOVE SURG UNDER POLY LF SZ7.5 (GLOVE) ×3 IMPLANT
GOWN STRL REUS W/ TWL LRG LVL3 (GOWN DISPOSABLE) ×5 IMPLANT
GOWN STRL REUS W/TWL LRG LVL3 (GOWN DISPOSABLE) ×15
HANDLE SUCTION POOLE (INSTRUMENTS) ×1 IMPLANT
HEMOSTAT SNOW SURGICEL 2X4 (HEMOSTASIS) ×6 IMPLANT
KIT BASIN OR (CUSTOM PROCEDURE TRAY) ×3 IMPLANT
KIT TURNOVER KIT B (KITS) ×3 IMPLANT
LIGASURE IMPACT 36 18CM CVD LR (INSTRUMENTS) ×3 IMPLANT
NS IRRIG 1000ML POUR BTL (IV SOLUTION) ×6 IMPLANT
PACK GENERAL/GYN (CUSTOM PROCEDURE TRAY) ×3 IMPLANT
PAD ARMBOARD 7.5X6 YLW CONV (MISCELLANEOUS) ×9 IMPLANT
PENCIL SMOKE EVACUATOR (MISCELLANEOUS) ×3 IMPLANT
RELOAD PROXIMATE 75MM BLUE (ENDOMECHANICALS) ×6 IMPLANT
SCISSORS LAP 5X35 DISP (ENDOMECHANICALS) IMPLANT
SET IRRIG TUBING LAPAROSCOPIC (IRRIGATION / IRRIGATOR) ×3 IMPLANT
SET TUBE SMOKE EVAC HIGH FLOW (TUBING) ×3 IMPLANT
SLEEVE ENDOPATH XCEL 5M (ENDOMECHANICALS) ×3 IMPLANT
SPECIMEN JAR LARGE (MISCELLANEOUS) IMPLANT
SPONGE T-LAP 18X18 ~~LOC~~+RFID (SPONGE) ×9 IMPLANT
STAPLER GUN LINEAR PROX 60 (STAPLE) ×3 IMPLANT
STAPLER PROXIMATE 75MM BLUE (STAPLE) ×3 IMPLANT
STAPLER VISISTAT 35W (STAPLE) ×3 IMPLANT
SUCTION POOLE HANDLE (INSTRUMENTS) ×3
SUT ETHILON 2 0 PSLX (SUTURE) ×3 IMPLANT
SUT PDS AB 1 TP1 96 (SUTURE) ×6 IMPLANT
SUT SILK 2 0 (SUTURE) ×3
SUT SILK 2 0 SH CR/8 (SUTURE) ×3 IMPLANT
SUT SILK 2-0 18XBRD TIE 12 (SUTURE) ×1 IMPLANT
SUT SILK 3 0 (SUTURE) ×3
SUT SILK 3 0 SH CR/8 (SUTURE) ×3 IMPLANT
SUT SILK 3-0 18XBRD TIE 12 (SUTURE) ×1 IMPLANT
SUT VIC AB 2-0 SH 18 (SUTURE) ×3 IMPLANT
SUT VIC AB 3-0 SH 18 (SUTURE) ×3 IMPLANT
SUT VIC AB 3-0 SH 27 (SUTURE)
SUT VIC AB 3-0 SH 27X BRD (SUTURE) IMPLANT
TOWEL GREEN STERILE (TOWEL DISPOSABLE) ×3 IMPLANT
TOWEL GREEN STERILE FF (TOWEL DISPOSABLE) ×3 IMPLANT
TRAY FOLEY MTR SLVR 14FR STAT (SET/KITS/TRAYS/PACK) IMPLANT
TRAY FOLEY MTR SLVR 16FR STAT (SET/KITS/TRAYS/PACK) IMPLANT
TRAY LAPAROSCOPIC MC (CUSTOM PROCEDURE TRAY) IMPLANT
TROCAR XCEL BLUNT TIP 100MML (ENDOMECHANICALS) ×3 IMPLANT
TROCAR XCEL NON-BLD 11X100MML (ENDOMECHANICALS) IMPLANT
TROCAR XCEL NON-BLD 5MMX100MML (ENDOMECHANICALS) ×3 IMPLANT
YANKAUER SUCT BULB TIP NO VENT (SUCTIONS) ×3 IMPLANT

## 2021-05-24 NOTE — Anesthesia Procedure Notes (Signed)
Procedure Name: Intubation Date/Time: 05/24/2021 2:50 PM Performed by: Betha Loa, CRNA Pre-anesthesia Checklist: Patient identified, Emergency Drugs available, Suction available and Patient being monitored Patient Re-evaluated:Patient Re-evaluated prior to induction Oxygen Delivery Method: Circle System Utilized Preoxygenation: Pre-oxygenation with 100% oxygen Induction Type: IV induction Ventilation: Mask ventilation without difficulty Laryngoscope Size: Mac and 4 Grade View: Grade I Tube type: Oral Tube size: 7.5 mm Number of attempts: 1 Airway Equipment and Method: Stylet and Oral airway Placement Confirmation: ETT inserted through vocal cords under direct vision, positive ETCO2 and breath sounds checked- equal and bilateral Secured at: 22 cm Tube secured with: Tape Dental Injury: Teeth and Oropharynx as per pre-operative assessment

## 2021-05-24 NOTE — Progress Notes (Addendum)
eLink Physician-Brief Progress Note Patient Name: Patrick [name] DOB: 1955-11-13 MRN: [id number]   Date of Service  05/24/2021  HPI/Events of Note  Hypokalemia - K+ = 3.3 and Creatinine = 2.89. Patient with Hx of ESRD on iHD. Hgb = 7.9.  eICU Interventions  Will not replace K+ at this time.      Intervention Category Major Interventions: Electrolyte abnormality - evaluation and management  Lysle Dingwall 05/24/2021, 8:37 PM

## 2021-05-24 NOTE — Procedures (Signed)
I was present at this dialysis session. I have reviewed the session itself and made appropriate changes.   HD pushed to this AM.  3K bath. No UF. Using AV access. No issues.  Filed Weights   05/22/21 0419 05/23/21 0415 05/24/21 0600  Weight: 67.3 kg 64.3 kg 64.5 kg    Recent Labs  Lab 05/24/21 0400  NA 138  K 3.9  CL 95*  CO2 29  GLUCOSE 108*  BUN 81*  CREATININE 7.06*  CALCIUM 8.6*  PHOS 6.2*    Recent Labs  Lab 05/22/21 0252 05/23/21 0131 05/24/21 0400  WBC 7.2 9.0 8.8  NEUTROABS 6.0 6.6 6.8  HGB 7.6* 7.8* 7.5*  HCT 21.0* 22.5* 21.9*  MCV 96.8 100.0 100.0  PLT 176 193 174    Scheduled Meds:  [START ON 05/27/2021] darbepoetin (ARANESP) injection - DIALYSIS  200 mcg Intravenous Q Sat-HD   fluticasone furoate-vilanterol  1 puff Inhalation Daily   And   umeclidinium bromide  1 puff Inhalation Daily   insulin aspart  0-6 Units Subcutaneous Q6H   pantoprazole (PROTONIX) IV  40 mg Intravenous Q12H   Continuous Infusions:  amiodarone 30 mg/hr (05/24/21 0257)   TPN ADULT (ION) 20 mL/hr at 05/23/21 1719   PRN Meds:.acetaminophen **OR** acetaminophen, albuterol, HYDROmorphone (DILAUDID) injection, naLOXone (NARCAN)  injection, ondansetron (ZOFRAN) IV   Pearson Grippe  MD 05/24/2021, 8:01 AM

## 2021-05-24 NOTE — Progress Notes (Signed)
Per MD order, RT advanced pt's ETT 2cm from 24 at the lip to now 26 at the lip.

## 2021-05-24 NOTE — Transfer of Care (Signed)
Immediate Anesthesia Transfer of Care Note  Patient: Patrick [name]  Procedure(s) Performed: LAPAROSCOPY DIAGNOSTIC, CHOLECYSTECTOMY (Abdomen) POSSIBLE EXPLORATORY LAPAROTOMY (Abdomen) SMALL BOWEL RESECTION (Abdomen)  Patient Location: ICU  Anesthesia Type:General  Level of Consciousness: sedated and Patient remains intubated per anesthesia plan  Airway & Oxygen Therapy: Patient remains intubated per anesthesia plan and Patient placed on Ventilator (see vital sign flow sheet for setting)  Post-op Assessment: Report given to RN and Post -op Vital signs reviewed and stable  Post vital signs: Reviewed and stable  Last Vitals:  Vitals Value Taken Time  BP    Temp    Pulse 72 05/24/21 1715  Resp 22 05/24/21 1715  SpO2 88 % 05/24/21 1715  Vitals shown include unvalidated device data.  Last Pain:  Vitals:   05/24/21 1341  TempSrc: Oral  PainSc:       Patients Stated Pain Goal: 3 (89/21/19 4174)  Complications: No notable events documented.

## 2021-05-24 NOTE — Op Note (Signed)
Preoperative diagnosis: 1.  Choledocholithiasis 2.  Small bowel obstruction Postoperative diagnosis: Gangrenous cholecystitis, chronic small bowel ischemia Procedure: 1.  Diagnostic laparoscopy 2.  Small bowel resection with primary anastomosis 3.  Open cholecystectomy Surgeon: Dr. Serita Grammes Assistant:Martha Anice Paganini, PA-C Anesthesia: General with bilateral tap blocks EBL: 150 cc Specimens: small bowel, gallbladder Complications none Sponge and needle count correct times two Dispo to icu in critical condition  Indications: This is a 65 year old male who was admitted recently to an outside hospital and found to have a small bowel obstruction as well as choledocholithiasis.  He has stents placed after ERCP.  He got better and apparently was discharged.  He returned here with a bowel obstruction.  This is not resolved over some time.  I discussed finally going to the operating room to him to relieve the bowel obstruction as well as potentially remove his gallbladder as we were taken the wrist already go to the operating room.  His operative mortality calculated is 30%.  He was excepting of this risk and I discussed with he and his family prior to beginning.  Procedure: After informed consent was obtained the patient was taken to the operating.  He was given 2 units of blood as we began.  He was given antibiotics.  SCDs were in place.  He was placed under general anesthesia without complication.  He was prepped and draped in the standard sterile surgical fashion.  Surgical timeout was then performed.  I filtrated Marcaine below the umbilicus.  I made incision.  I grasped the fascia and incised this sharply entered the peritoneum bluntly.  I then inserted a Hassan trocar and insufflated the abdomen to 15 mmHg pressure.  I inserted 3 additional 5 mm trochars in the left hemiabdomen.  There was bilious ascites present throughout the abdomen.  His small bowel looked normal except for a segment that  was stuck in his pelvis that was dilated and very friable.  This had signs of chronic ischemia as well.  At this point I elected to open him.  I made a midline incision.  I evacuated all of the ascites.  I ran the small bowel from the ligament of Treitz to the terminal ileum.  There was a long segment in the jejunum and ileum that clearly was abnormal and appeared to have evidence of chronic ischemia.  This is where he appeared to be obstructed.  I elected to resect this piece.  I divided the ends that were viable with a GIA stapler.  I then used the LigaSure to divide the mesentery.  I brought the 2 ends into apposition.  I tacked them together with 3-0 silk suture.  I then closed the mesenteric defect with 2-0 silk suture.  I made enterotomies in both.  I inserted a GIA stapler and created an anastomosis.  The common enterotomy was closed with a TX stapler.  This was hemostatic and viable.  I placed two 3-0 silk sutures at the apex.  I reinspected this at the end and this was still healthy.  He was doing fairly well at this point so I elected to remove his gallbladder.  I packed to the the liver and was able to identify the gallbladder.  It appeared he had acute cholecystitis.  At 1 point I got into the gallbladder and the gallbladder was just full of purulent material.  I was able to take the gallbladder from the top down off the liver.  This whole entire part of the procedure  was complicated by oozing from his liver which appeared cirrhotic.  I then was able to take this down until the cystic duct and cystic artery.  I was able to identify these.  I clipped them and divided this removing the gallbladder.  This was also passed off the table.  I obtained hemostasis I packed this with snow.  I then placed a 19 [place] Blake drain.  This was secured with a 2-0 nylon suture.  We closed then with #1 looped PDS.  The wound was left open and packed with saline soaked gauze.  The laparoscopic sites were closed with  staples.  He will be transferred to the ICU in critical condition.

## 2021-05-24 NOTE — Progress Notes (Addendum)
Advanced Heart Failure Rounding Note  PCP-Cardiologist: Dr. Haroldine Laws   Subjective:    V-rates okay on amio gtt, 90s - low 100s.   Still has NG tube  No bowel movement, not passing gas. Abdominal pain somewhat better.  Wondering when he can get NGT out  Echo 12/06: EF 40-45%, RV mildly reduced, RVSP 55 mmHg, severe MR, severe TR   Objective:   Weight Range: 64.5 kg Body mass index is 19.83 kg/m.   Vital Signs:   Temp:  [97.4 F (36.3 C)-97.8 F (36.6 C)] 97.8 F (36.6 C) (12/07 1111) Pulse Rate:  [66-100] 90 (12/07 1111) Resp:  [10-23] 16 (12/07 1111) BP: (97-120)/(58-81) 118/81 (12/07 1111) SpO2:  [98 %-100 %] 100 % (12/07 1111) Weight:  [64.5 kg] 64.5 kg (12/07 1045) Last BM Date: 05/18/21  Weight change: Filed Weights   05/23/21 0415 05/24/21 0600 05/24/21 1045  Weight: 64.3 kg 64.5 kg 64.5 kg    Intake/Output:   Intake/Output Summary (Last 24 hours) at 05/24/2021 1148 Last data filed at 05/24/2021 1045 Gross per 24 hour  Intake 683.68 ml  Output 450 ml  Net 233.68 ml      Physical Exam    General:  Chronically ill appearing HEENT: + NGT, blind Neck: + JVP. Carotids 2+ bilat; no bruits. No lymphadenopathy or thryomegaly appreciated. Cor: PMI nondisplaced. Irregular rhythm. No murmur Lungs: clear Abdomen: soft, mildly tender, + distended.  Hypoactive bowel sounds Extremities: no cyanosis, clubbing, rash, edema Neuro: alert & orientedx3, cranial nerves grossly intact. moves all 4 extremities w/o difficulty. Affect pleasant    Telemetry   Afib, 90s-low 100s  EKG    No new EKG to review   Labs    CBC Recent Labs    05/23/21 0131 05/24/21 0400  WBC 9.0 8.8  NEUTROABS 6.6 6.8  HGB 7.8* 7.5*  HCT 22.5* 21.9*  MCV 100.0 100.0  PLT 193 979   Basic Metabolic Panel Recent Labs    05/23/21 0131 05/24/21 0400  NA 136 138  K 3.9 3.9  CL 95* 95*  CO2 27 29  GLUCOSE 160* 108*  BUN 75* 81*  CREATININE 5.89* 7.06*  CALCIUM 8.6*  8.6*  MG 2.4 2.6*  PHOS 5.3* 6.2*   Liver Function Tests Recent Labs    05/23/21 0131 05/24/21 0400  AST 33 35  ALT 16 18  ALKPHOS 168* 163*  BILITOT 2.0* 2.3*  PROT 5.9* 6.0*  ALBUMIN 2.4* 2.4*   No results for input(s): LIPASE, AMYLASE in the last 72 hours. Cardiac Enzymes No results for input(s): CKTOTAL, CKMB, CKMBINDEX, TROPONINI in the last 72 hours.  BNP: BNP (last 3 results) Recent Labs    08/01/20 2047  BNP 2,730.5*    ProBNP (last 3 results) No results for input(s): PROBNP in the last 8760 hours.   D-Dimer No results for input(s): DDIMER in the last 72 hours. Hemoglobin A1C No results for input(s): HGBA1C in the last 72 hours. Fasting Lipid Panel Recent Labs    05/23/21 0131  TRIG 73   Thyroid Function Tests No results for input(s): TSH, T4TOTAL, T3FREE, THYROIDAB in the last 72 hours.  Invalid input(s): FREET3  Other results:   Imaging    No results found.   Medications:     Scheduled Medications:  [START ON 05/27/2021] darbepoetin (ARANESP) injection - DIALYSIS  200 mcg Intravenous Q Sat-HD   fluticasone furoate-vilanterol  1 puff Inhalation Daily   And   umeclidinium bromide  1 puff Inhalation  Daily   insulin aspart  0-6 Units Subcutaneous Q6H   pantoprazole (PROTONIX) IV  40 mg Intravenous Q12H    Infusions:  amiodarone 30 mg/hr (05/24/21 0257)   TPN ADULT (ION) 20 mL/hr at 05/23/21 1719   TPN ADULT (ION)      PRN Medications: acetaminophen **OR** acetaminophen, albuterol, HYDROmorphone (DILAUDID) injection, naLOXone (NARCAN)  injection, ondansetron (ZOFRAN) IV    Patient Profile   65 y/o chronically ill male with sickle cell dz, ESRD, chronic systolic HF EF 79%, pulmonary HTN, chronic AF and legal blindness. Admitted with recurrent SBO. We are asked to help manage AF with RVR and assess peri-operative risk.   Assessment/Plan    1. Persistent Atrial Fibrillation/AFL - S/p DCCV 05/2018 - S/P DCCV 10/06/19 NSR -  Recurrence 05/21. Has been seen in AF Clinic and rate control strategy recommended - now on amio gtt for rate control in setting of SBO/NPO status  - Holding anticoagulation d/t a/c anemia. Concern about risks of recurrent bleeding with anticoagulation. See below   2. Chronic combined Systolic/Diastolic Heart Failure:  - NICM. Cath 05/2011 without significant coronary disease.   - ECHO 12/2015 40%.  - Echo 05/2018: EF 25-30%.  - Echo 3/21 LVEF 35-40%, low normal RV function,severe pulm HTN. Severe LAE, mod RAE, mod MR, severe TR. RV TAPSE 1.6, RV s' 10 - Echo 9/21 EF 40-45%, RV mildly reduced, mild RAE, severe pulm artery systolic pressure, severe TR - Echo this admit suggestive of restrictive CM with EF 40-45%, RV mildly decreased, severe MR, severe TR, RVSP 55 mmHg - NYHA III at baseline.  Appears slightly volume up. JVP to jaw in setting of severe TR. Volume managed with HD on T,T,S + takes 80 mg po torsemide at home (No po diuretic this admit) - GDMT including coreg and hydralazine/imdur on hold. BP soft.  - Off Entresto with ESRD   3. Anemia with H/o GI bleeding: - Prior EGD in 2019 showed nonbleeding gastritis, Colonoscopy showed small internal hemorrhoids, otherwise normal.  - GI bleed 02/22 - gastric erosions on EGD - A/C anemia 11/22 - transfused with 3 uPRBCs - A/C anemia s/p 2 u PRBCs this admit. Hgb 7.5 today. Follow CBC daily. Tranfuse if Hgb drops below 7 - Baseline hgb 8-9 - Received aranesp - Holding anticoagulation. May need to weigh risks/benefits of resuming anticoagulation with hx recurrent bleeding. Not sure if he would be a good candidate for LAA occlusion with Watchman device   4. SBO - CT 12/04 w/ large amount of upper abdominal ascites and SBO - Currently NPO. Has NG tube - putting out dark fluid - Bili improved 3.1>2.1 - AST and ALT okay - General surgery and GI following, plan is for possible surgery today - Can continue IV amio for rate control  perioperatively. - He is at increased risk for surgery but not prohibitive.   5. Choledocholithiasis - S/p ERCP w/ stone extraction + placement of common bile duct and pancreatic duct stents in 11/22 -Planning for ERCP and stent removal in 3 weeks   6. Pulmonary HTN  - Last echo 09/21 with EF 40-45%, RV mildly reduced, RVSP 75 mmHg, severe TR - Repeat echo this admit with EF 40-45%, RV mildly reduced, severe MR, severe TR, RVSP 55 mmHg   7. Abdominal ascites -Noted on CT -Mild hepatic nodularity present   8. HTN: - BP soft - Home medications held   9.  Sickle Cell:   - Follows closely with [facility].  30. Former smoker:   - Quit 11/2015.   11. ESRD - On HD now T/T/S - Nephrology following   12. Perioperative Risk Assessment  - He is very tenuous and very high risk for any surgery. That said, given recurrent/refractory SBO, he may not have any other options.  - Echo suggestive of restrictive CM with severe TR/MR. Not much we can do to lower his operative CV risk. -Okay to proceed with surgery as long as he understands risk of peri-operative complications (including death)  Length of Stay: [place], LINDSAY N, PA-C  05/24/2021, 11:48 AM  Advanced Heart Failure Team Pager [phone] (M-F; 7a - 5p)  Please contact [facility] for night-coverage after hours (5p -7a ) and weekends on [email or web address]  Patient seen and examined with the above-signed Advanced Practice Provider and/or Housestaff. I personally reviewed laboratory data, imaging studies and relevant notes. I independently examined the patient and formulated the important aspects of the plan. I have edited the note to reflect any of my changes or salient points. I have personally discussed the plan with the patient and/or family.  Tolerated HD this am. Belly feeling some better but not passing gas. No BM. Denies SOB, orthopnea or PND. AF rate improved on IV amio.  General:  Weak appearing. No resp  difficulty HEENT: normal Neck: supple. JVP 6. Carotids 2+ bilat; no bruits. No lymphadenopathy or thryomegaly appreciated. Cor: PMI nondisplaced. Regular rate & rhythm. No rubs, gallops or murmurs. Lungs: clear Abdomen: soft, nontender, + distended. No hepatosplenomegaly. No bruits or masses. Hypoactive bowel sounds. Extremities: no cyanosis, clubbing, rash, edema Neuro: alert & orientedx3, cranial nerves grossly intact. moves all 4 extremities w/o difficulty. Affect pleasant  Long talk with him again about his surgical risk. He understands the need for surgery and the high-risk surrounding it and wants to proceed. We will continue to follow closely post-op. Continue IV amio for now.  Glori Bickers, MD  3:04 PM

## 2021-05-24 NOTE — Progress Notes (Signed)
PT Cancellation Note  Patient Details Name: Patrick [name] MRN: [id number] DOB: 07-Aug-1955   Cancelled Treatment:    Reason Eval/Treat Not Completed: Patient at procedure or test/unavailable Pt to OR for exp lap, bowel resection, LOA.  Will hold PT today. Abran Richard, PT Acute Rehab Services Pager [phone] [facility] [phone]   Karlton Lemon 05/24/2021, 2:02 PM

## 2021-05-24 NOTE — Anesthesia Preprocedure Evaluation (Signed)
Anesthesia Evaluation  Patient identified by MRN, date of birth, ID band Patient awake    Reviewed: Allergy & Precautions, H&P , NPO status , Patient's Chart, lab work & pertinent test results  Airway Mallampati: II   Neck ROM: full    Dental   Pulmonary shortness of breath, former smoker,    breath sounds clear to auscultation       Cardiovascular hypertension, +CHF   Rhythm:regular Rate:Normal  EF 40-45%. Severe MR, severe TR. LA and RA severely dilated.   Neuro/Psych  Headaches,    GI/Hepatic   Endo/Other    Renal/GU ESRF and DialysisRenal disease     Musculoskeletal   Abdominal   Peds  Hematology  (+) Blood dyscrasia, Sickle cell anemia and anemia ,   Anesthesia Other Findings   Reproductive/Obstetrics                             Anesthesia Physical Anesthesia Plan  ASA: 3  Anesthesia Plan: General   Post-op Pain Management: Regional block   Induction: Intravenous  PONV Risk Score and Plan: 2 and Ondansetron, Dexamethasone, Treatment may vary due to age or medical condition and Midazolam  Airway Management Planned: Oral ETT  Additional Equipment:   Intra-op Plan:   Post-operative Plan: Extubation in OR  Informed Consent: I have reviewed the patients History and Physical, chart, labs and discussed the procedure including the risks, benefits and alternatives for the proposed anesthesia with the patient or authorized representative who has indicated his/her understanding and acceptance.     Dental advisory given  Plan Discussed with: CRNA, Anesthesiologist and Surgeon  Anesthesia Plan Comments:         Anesthesia Quick Evaluation

## 2021-05-24 NOTE — Consult Note (Addendum)
NAME:  Patrick [name], MRN:  [id number], DOB:  03/14/1956, LOS: 5 ADMISSION DATE:  05/19/2021, CONSULTATION DATE:  12/7 REFERRING MD:  Dr. Bonner Puna, CHIEF COMPLAINT:  SBO  History of Present Illness:  Patient is a 65 year old male with pertinent PMH ESRD (dialysis Tuesday, Thursday, Saturday), chronic systolic CHF, COPD, pulm hypertension, sickle cell anemia, chronic A. fib on Eliquis, SBO, choledocholithiasis presents to [facility] on 12/2 with recurrent SBO.  On 05/09/2021, patient was hospitalized at [facility] with SBO and choledocholithiasis.  SBO resolved with conservative measures and choledocholithiasis improved with ERCP and stent placement.  Patient's abdominal pain continue to worsen and was admitted to [facility] on 12/2 which showed recurrent SBO and choledocholithiasis.  Patient hemoglobin low and stopped home Eliquis.  NG tube placed and made NPO.  On 12/7 patient received surgery for SBO and choledocholithiasis.  Patient received ex lap, surgical resection of SBO and cholecystectomy.  Patient remained on vaso and remained intubated postprocedure.  PCCM consulted for ICU admission and vent/medical management.  Pertinent  Medical History   Past Medical History:  Diagnosis Date   Blood dyscrasia    Blood transfusion    "I've had a bunch of them"   CHF (congestive heart failure) ([place])    followed by hf clinic   Chronic kidney disease (CKD), stage III (moderate) ([place])    Dialysis patient ([place]) 04/2019   AV fistula (right arm)   Gout    Headache(784.0)    "probably weekly" (01/13/2014)   Legally blind    Shortness of breath dyspnea    Sickle cell disease, type SS (HCC)    Swelling abdomen    Swelling of extremity, left    Swelling of extremity, right      Significant Hospital Events: Including procedures, antibiotic start and stop dates in addition to other pertinent events   12/2: admitted to [facility] for bowel obstruction 12/7: surgery bowel resection and drain placement;  arrived to ICU intubated post procedure  Interim History / Subjective:  Patient intubated Sedated on propofol post surgery On amio drip Vaso 0.03  Objective   Blood pressure 118/67, pulse 87, temperature 98.2 F (36.8 C), resp. rate (!) 26, height 5\' 11"  (1.803 m), weight 64.5 kg, SpO2 96 %.        Intake/Output Summary (Last 24 hours) at 05/24/2021 1641 Last data filed at 05/24/2021 1630 Gross per 24 hour  Intake 1913.68 ml  Output 650 ml  Net 1263.68 ml   Filed Weights   05/23/21 0415 05/24/21 0600 05/24/21 1045  Weight: 64.3 kg 64.5 kg 64.5 kg    Examination: General:  critically ill appearing on mech vent HEENT: MM pink/moist; ETT in place Neuro:  sedated CV: s1s2, no m/r/g PULM:  dim clear BS bilaterally; on mech vent PRVC GI: abdominal dressing in place with blood in JP drain  Extremities: warm/dry, no edema  Skin: no rashes or lesions appreciated   Resolved Hospital Problem list     Assessment & Plan:  Acute respiratory failure requiring mechanical ventilation post surgery COPD: takes incruse and ellipta at home P: -continue mech vent 6-8 cc/kg -check abg in 1 hour; change settings accordingly -wean fio2 for sats >92% -check cxr -VAP prevention in place -wean sedation for RASS 0 to -1 -SBT/SAT in am -changed incruse/ellipta to yupelri, brovana, pulmicort while on vent -prn albuterol for wheezing  SBO: s/p ex lap and surgical resection 12/7 Gangrenous cholecystitis: 12/7 cholecystectomy; recent Choledocholithiasis s/p recent ERCP stent placement at [facility]  Hyperbilirubinemia P: -surgery following  -continue NPO and OG -continue flagyl and zosyn -continue TPN -trend CMP -follow surgical pathology  Hypotension post surgery vs septic shock P: -currently on vaso; wean for MAP >65 -giving 500 ml LR bolus to help wean off pressors -check cbc  ESRD: on HD tues, thurs, sat Hyponatremia Hyperphophatemia P: -nephrology following -continue  HD -Trend BMP/UOP -Replace electrolytes as indicated -Avoid nephrotoxic agents, ensure adequate renal perfusion  ABLA on chronic Anemia: sickle cell and CKD P: -trend CBC -transfuse for hgb <7 -continue aranesp  Chronic systolic CHF: echo 8546 EF of 40 to 45% with mild right ventricular dysfunction and severe biatrial enlargement and severe mitral regurg and tricuspid regurg Afib RVR: on eliquis at home Pulm HTN P: -HF following -eliquis on hold due to recent surgery and hgb drop -continue amio -continue HD and torsemide -hold antihypertensives while hypotensive -daily weights -strict I/Os  Sickle cell disease P: -will need prn pain control when off sedation  Protein calorie malnutrition P: -TPN   Best Practice (right click and "Reselect all SmartList Selections" daily)   Diet/type: NPO DVT prophylaxis: SCD GI prophylaxis: PPI Lines: Arterial Line Foley:  Yes, and it is still needed Code Status:  full code Last date of multidisciplinary goals of care discussion [pending]  Labs   CBC: Recent Labs  Lab 05/20/21 1420 05/21/21 0248 05/21/21 1540 05/22/21 0252 05/23/21 0131 05/24/21 0400  WBC 6.2 7.7  --  7.2 9.0 8.8  NEUTROABS  --  5.4  --  6.0 6.6 6.8  HGB 6.9* 7.1* 8.4* 7.6* 7.8* 7.5*  HCT 18.8* 20.2* 23.6* 21.0* 22.5* 21.9*  MCV 105.6* 99.5  --  96.8 100.0 100.0  PLT 153 172  --  176 193 270    Basic Metabolic Panel: Recent Labs  Lab 05/20/21 0249 05/21/21 0248 05/22/21 0252 05/23/21 0131 05/24/21 0400  NA 132* 135 135 136 138  K 5.4* 4.2 3.9 3.9 3.9  CL 91* 94* 95* 95* 95*  CO2 28 30 27 27 29   GLUCOSE 108* 105* 103* 160* 108*  BUN 51* 33* 59* 75* 81*  CREATININE 5.77* 3.55* 4.85* 5.89* 7.06*  CALCIUM 8.4* 8.1* 8.2* 8.6* 8.6*  MG 2.1 1.9 2.2 2.4 2.6*  PHOS 6.3* 5.5* 5.8* 5.3* 6.2*   GFR: Estimated Creatinine Clearance: 9.8 mL/min (A) (by C-G formula based on SCr of 7.06 mg/dL (H)). Recent Labs  Lab 05/21/21 0248 05/22/21 0252  05/23/21 0131 05/24/21 0400  WBC 7.7 7.2 9.0 8.8    Liver Function Tests: Recent Labs  Lab 05/20/21 0249 05/21/21 0248 05/22/21 0252 05/23/21 0131 05/24/21 0400  AST 34 35 27 33 35  ALT 18 18 17 16 18   ALKPHOS 118 140* 152* 168* 163*  BILITOT 2.7* 3.6* 2.1* 2.0* 2.3*  PROT 5.6* 5.3* 5.1* 5.9* 6.0*  ALBUMIN 2.4* 2.2* 2.1* 2.4* 2.4*   Recent Labs  Lab 05/19/21 1445  LIPASE 51   No results for input(s): AMMONIA in the last 168 hours.  ABG    Component Value Date/Time   PHART 7.329 (L) 06/11/2011 0958   PCO2ART 31.1 (L) 06/11/2011 0958   PO2ART 157.0 (H) 06/11/2011 0958   HCO3 16.3 (L) 06/11/2011 0958   TCO2 30 08/08/2020 0621   ACIDBASEDEF 9.0 (H) 06/11/2011 0958   O2SAT 99.0 06/11/2011 0958     Coagulation Profile: Recent Labs  Lab 05/20/21 0249  INR 1.5*    Cardiac Enzymes: No results for input(s): CKTOTAL, CKMB, CKMBINDEX, TROPONINI in the last 168  hours.  HbA1C: Hgb A1c MFr Bld  Date/Time Value Ref Range Status  06/07/2011 04:03 PM <4.0 <5.7 % Final    Comment:    (NOTE)                                                                       According to the ADA Clinical Practice Recommendations for 2011, when HbA1c is used as a screening test:  >=6.5%   Diagnostic of Diabetes Mellitus           (if abnormal result is confirmed) 5.7-6.4%   Increased risk of developing Diabetes Mellitus References:Diagnosis and Classification of Diabetes Mellitus,Diabetes FUXN,2355,73(UKGUR 1):S62-S69 and Standards of Medical Care in         Diabetes - 2011,Diabetes KYHC,6237,62 (Suppl 1):S11-S61. Result repeated and verified. Suggest recollect if clinically inducated.    CBG: Recent Labs  Lab 05/22/21 0743 05/22/21 1158 05/22/21 1551 05/23/21 2349 05/24/21 1154  GLUCAP 105* 99 89 100* 103*    Review of Systems:   Unable to obtain. Patient intubated and sedated  Past Medical History:  He,  has a past medical history of Blood dyscrasia, Blood  transfusion, CHF (congestive heart failure) ([place]), Chronic kidney disease (CKD), stage III (moderate) ([place]), Dialysis patient ([place]) (04/2019), Gout, Headache(784.0), Legally blind, Shortness of breath dyspnea, Sickle cell disease, type SS ([place]), Swelling abdomen, Swelling of extremity, left, and Swelling of extremity, right.   Surgical History:   Past Surgical History:  Procedure Laterality Date   A/V FISTULAGRAM Right 07/29/2020   Procedure: A/V FISTULAGRAM - Right Arm;  Surgeon: Angelia Mould, MD;  Location: [place] [facility];  Service: Cardiovascular;  Laterality: Right;   BASCILIC VEIN TRANSPOSITION Right 04/12/2014   Procedure: BASILIC VEIN TRANSPOSITION;  Surgeon: Elam Dutch, MD;  Location: [place];  Service: Vascular;  Laterality: Right;   BIOPSY  08/04/2020   Procedure: BIOPSY;  Surgeon: Ladene Artist, MD;  Location: [facility] ENDOSCOPY;  Service: Gastroenterology;;   CARDIAC CATHETERIZATION  05/2011   CARDIOVERSION N/A 06/05/2018   Procedure: CARDIOVERSION;  Surgeon: Jolaine Artist, MD;  Location: [place];  Service: Cardiovascular;  Laterality: N/A;   CARDIOVERSION N/A 10/06/2019   Procedure: CARDIOVERSION;  Surgeon: Jolaine Artist, MD;  Location: [place];  Service: Cardiovascular;  Laterality: N/A;   COLONOSCOPY WITH PROPOFOL N/A 09/24/2017   Procedure: COLONOSCOPY WITH PROPOFOL;  Surgeon: Jackquline Denmark, MD;  Location: [place];  Service: Endoscopy;  Laterality: N/A;   ESOPHAGOGASTRODUODENOSCOPY N/A 09/19/2017   Procedure: ESOPHAGOGASTRODUODENOSCOPY (EGD);  Surgeon: Jackquline Denmark, MD;  Location: [facility] ENDOSCOPY;  Service: Endoscopy;  Laterality: N/A;   ESOPHAGOGASTRODUODENOSCOPY (EGD) WITH PROPOFOL N/A 08/04/2020   Procedure: ESOPHAGOGASTRODUODENOSCOPY (EGD) WITH PROPOFOL;  Surgeon: Ladene Artist, MD;  Location: [place];  Service: Gastroenterology;  Laterality: N/A;   IR PERC TUN PERIT CATH WO PORT S&I /IMAG  05/23/2021   IR US GUIDE VASC ACCESS RIGHT   05/23/2021   LEFT AND RIGHT HEART CATHETERIZATION WITH CORONARY ANGIOGRAM N/A 06/11/2011   Procedure: LEFT AND RIGHT HEART CATHETERIZATION WITH CORONARY ANGIOGRAM;  Surgeon: Larey Dresser, MD;  [facility] CATH LAB;  Service: Cardiovascular;  Laterality: N/A;   PERIPHERAL VASCULAR BALLOON ANGIOPLASTY Right 07/29/2020   Procedure: PERIPHERAL VASCULAR BALLOON ANGIOPLASTY;  Surgeon: Angelia Mould, MD;  Location: [place] [facility];  Service: Cardiovascular;  Laterality: Right;  arm fistula   REVISON OF ARTERIOVENOUS FISTULA Right 08/08/2020   Procedure: ANEURYSM EXCISION OF RIGHT UPPER EXTREMITY ARTERIOVENOUS FISTULA;  Surgeon: Angelia Mould, MD;  Location: [place];  Service: Vascular;  Laterality: Right;   TEE WITHOUT CARDIOVERSION N/A 12/27/2015   Procedure: TRANSESOPHAGEAL ECHOCARDIOGRAM (TEE);  Surgeon: Sueanne Margarita, MD;  Location: [facility] ENDOSCOPY;  Service: Cardiovascular;  Laterality: N/A;     Social History:   reports that he quit smoking about 5 years ago. His smoking use included cigarettes. He has a 10.25 pack-year smoking history. He has never used smokeless tobacco. He reports that he does not currently use alcohol after a past usage of about 3.0 standard drinks per week. He reports that he does not use drugs.   Family History:  His family history includes Alcohol abuse in his mother; Cirrhosis in his mother; Heart attack in his mother; Liver disease in his mother.   Allergies No Known Allergies   Home Medications  Prior to Admission medications   Medication Sig Start Date End Date Taking? Authorizing Provider  acetaminophen (TYLENOL) 500 MG tablet Take 1,000 mg by mouth every 6 (six) hours as needed for moderate pain or headache.   Yes [provider]  allopurinol (ZYLOPRIM) 100 MG tablet TAKE 1 TABLET BY MOUTH EVERY DAY 05/17/21  Yes Dorena Dew, FNP  AURYXIA 1 GM 210 MG(Fe) tablet Take 420 mg by mouth 3 (three) times daily. 02/11/21  Yes [provider]  diphenhydramine-acetaminophen (TYLENOL PM) 25-500 MG TABS tablet Take 2 tablets by mouth at bedtime as needed (sleep).   Yes [provider]  ELIQUIS 5 MG TABS tablet TAKE 1 TABLET BY MOUTH TWICE A DAY 02/21/21  Yes Bensimhon, Shaune Pascal, MD  folic acid (FOLVITE) 1 MG tablet TAKE 1 TABLET BY MOUTH EVERY DAY 02/22/21  Yes Dorena Dew, FNP  gabapentin (NEURONTIN) 100 MG capsule TAKE 1 CAPSULE BY MOUTH EVERYDAY AT BEDTIME 12/12/20  Yes Dorena Dew, FNP  hydrALAZINE (APRESOLINE) 25 MG tablet Take 1 tablet (25 mg total) by mouth in the morning and at bedtime. DO NOT TAKE AM DOSES ON DIALYSIS DAYS Patient taking differently: Take 25 mg by mouth See admin instructions. Take 1 tablet by mouth twice daily all days, EXCEPT on dialysis day (T, Th, Sat) take in the evening only 01/27/20 05/19/21 Yes Bensimhon, Shaune Pascal, MD  hydroxyurea (HYDREA) 500 MG capsule Take 1 capsule (500 mg total) by mouth daily. TAKE 1 CAPSULE BY MOUTH EVERY DAY WITH FOOD TO MINIMIZE GI SIDE EFFECTS 01/31/21  Yes Dorena Dew, FNP  isosorbide mononitrate (IMDUR) 30 MG 24 hr tablet Take 1 tablet (30 mg total) by mouth daily. DO NOT TAKE AM ON DIALYSIS DAYS. 08/25/20  Yes Bensimhon, Shaune Pascal, MD  lidocaine-prilocaine (EMLA) cream Apply 1 application topically daily as needed (port access).  09/08/19  Yes [provider]  metolazone (ZAROXOLYN) 5 MG tablet Take 1 tablet (5 mg total) by mouth daily as needed. Patient taking differently: Take 5 mg by mouth daily as needed (fluid). 05/23/20  Yes Bensimhon, Shaune Pascal, MD  Oxycodone HCl 10 MG TABS Take 1 tablet (10 mg total) by mouth every 4 (four) hours as needed (pain). 05/17/21  Yes Dorena Dew, FNP  tiZANidine (ZANAFLEX) 4 MG tablet TAKE 0.5 TABLET BY MOUTH EVERY 8 HOURS AS NEEDED FOR MUSCLE SPASMS Patient taking differently:  Take 2 mg by mouth every 8 (eight) hours as needed for muscle spasms. 02/22/21  Yes Dorena Dew, FNP  torsemide (DEMADEX)  20 MG tablet Take 80 mg by mouth daily. 03/31/20  Yes [provider]  Vitamin D, Ergocalciferol, (DRISDOL) 1.25 MG (50000 UNIT) CAPS capsule TAKE 1 CAPSULE BY MOUTH ONE TIME PER WEEK Patient taking differently: Take 50,000 Units by mouth every Saturday. 05/15/21  Yes Dorena Dew, FNP  albuterol (VENTOLIN HFA) 108 (90 Base) MCG/ACT inhaler Inhale 2 puffs into the lungs every 6 (six) hours as needed for wheezing or shortness of breath. Patient not taking: Reported on 05/19/2021 05/09/20   Freddi Starr, MD  Budeson-Glycopyrrol-Formoterol (BREZTRI AEROSPHERE) 160-9-4.8 MCG/ACT AERO Inhale 2 puffs into the lungs in the morning and at bedtime. Patient not taking: Reported on 05/19/2021 12/21/20   Freddi Starr, MD  carvedilol (COREG) 12.5 MG tablet Take 1 tablet (12.5 mg total) by mouth 2 (two) times daily with a meal. Patient not taking: Reported on 05/19/2021 05/16/21   Bensimhon, Shaune Pascal, MD     Critical care time: 45 minutes    JD [facility] 05/24/2021, 4:41 PM  Please see [email or web address] for pager details.  From 7A-7P if no response, please call [phone]. After hours, please call ELink [phone].

## 2021-05-24 NOTE — Anesthesia Procedure Notes (Addendum)
Anesthesia Regional Block: TAP block   Pre-Anesthetic Checklist: , timeout performed,  Correct Patient, Correct Site, Correct Laterality,  Correct Procedure, Correct Position, site marked,  Risks and benefits discussed,  Surgical consent,  Pre-op evaluation,  At surgeon's request and post-op pain management  Laterality: Left  Prep: chloraprep       Needles:  Injection technique: Single-shot  Needle Type: Echogenic Needle     Needle Length: 9cm  Needle Gauge: 21     Additional Needles:   Narrative:  Start time: 05/24/2021 2:10 PM End time: 05/24/2021 2:17 PM Injection made incrementally with aspirations every 5 mL.  Performed by: Personally  Anesthesiologist: Albertha Ghee, MD  Additional Notes: Pt tolerated the procedure well.

## 2021-05-24 NOTE — Progress Notes (Signed)
Progress Note  Day of Surgery  Subjective: Patrick [name] in HD. Pt reports abdominal pain is a little better but still has not passed any flatus or had a BM. We discussed likely surgery today but patient would like MD to discuss with his family as well.   Objective: Vital signs in last 24 hours: Temp:  [97.4 F (36.3 C)-97.7 F (36.5 C)] 97.7 F (36.5 C) (12/07 0600) Pulse Rate:  [66-100] 87 (12/07 0930) Resp:  [10-23] 22 (12/07 1000) BP: (97-115)/(58-79) 100/58 (12/07 1000) SpO2:  [99 %-100 %] 100 % (12/07 0600) Weight:  [64.5 kg] 64.5 kg (12/07 0600) Last BM Date: 05/18/21  Intake/Output from previous day: 12/06 0701 - 12/07 0700 In: 683.7 [I.V.:683.7] Out: 450 [Emesis/NG output:450] Intake/Output this shift: No intake/output data recorded.  PE: General: pleasant, NAD Abd: soft, NT on palpation, mild distention, +BS, NGT with reddish brown drainage   Lab Results:  Recent Labs    05/23/21 0131 05/24/21 0400  WBC 9.0 8.8  HGB 7.8* 7.5*  HCT 22.5* 21.9*  PLT 193 174   BMET Recent Labs    05/23/21 0131 05/24/21 0400  NA 136 138  K 3.9 3.9  CL 95* 95*  CO2 27 29  GLUCOSE 160* 108*  BUN 75* 81*  CREATININE 5.89* 7.06*  CALCIUM 8.6* 8.6*   PT/INR No results for input(s): LABPROT, INR in the last 72 hours. CMP     Component Value Date/Time   NA 138 05/24/2021 0400   NA 135 05/24/2020 1119   K 3.9 05/24/2021 0400   CL 95 (L) 05/24/2021 0400   CO2 29 05/24/2021 0400   GLUCOSE 108 (H) 05/24/2021 0400   BUN 81 (H) 05/24/2021 0400   BUN 25 05/24/2020 1119   CREATININE 7.06 (H) 05/24/2021 0400   CREATININE 2.90 (H) 08/11/2015 1359   CALCIUM 8.6 (L) 05/24/2021 0400   PROT 6.0 (L) 05/24/2021 0400   PROT 7.5 05/24/2020 1119   ALBUMIN 2.4 (L) 05/24/2021 0400   ALBUMIN 4.1 05/24/2020 1119   AST 35 05/24/2021 0400   ALT 18 05/24/2021 0400   ALKPHOS 163 (H) 05/24/2021 0400   BILITOT 2.3 (H) 05/24/2021 0400   BILITOT 2.4 (H) 05/24/2020 1119   GFRNONAA 8 (L)  05/24/2021 0400   GFRNONAA 23 (L) 08/11/2015 1359   GFRAA 24 (L) 05/24/2020 1119   GFRAA 27 (L) 08/11/2015 1359   Lipase     Component Value Date/Time   LIPASE 51 05/19/2021 1445       Studies/Results: IR US Guide Vasc Access Right  Result Date: 05/23/2021 INDICATION: 65 year old male with end-stage renal disease requiring central venous access for nutrition supplementation. EXAM: 1. Ultrasound-guided venipuncture of the jugular vein 2. Fluoroscopic guided placement of tunneled central venous catheter MEDICATIONS: None. ANESTHESIA/SEDATION: Local anesthesia only. FLUOROSCOPY TIME:  0 minutes 48 seconds (3 mGy). COMPLICATIONS: None immediate. PROCEDURE: Informed written consent was obtained from the patient after a discussion of the risks, benefits, and alternatives to treatment. Questions regarding the procedure were encouraged and answered. The right neck and chest were prepped with chlorhexidine in a sterile fashion, and a sterile drape was applied covering the operative field. Maximum barrier sterile technique with sterile gowns and gloves were used for the procedure. A timeout was performed prior to the initiation of the procedure. After creating a small venotomy incision, a 21 gauge micropuncture kit was utilized to access the internal jugular vein. Real-time ultrasound guidance was utilized for vascular access including the acquisition of a permanent  ultrasound image documenting patency of the accessed vessel. A Mandril wire to the level of the cavoatrial junction. A 6 French tunneled central venous catheter measuring 25 cm from tip to cuff was tunneled in a retrograde fashion from the anterior chest wall to the venotomy incision. A peel-away sheath was placed over the wire. The catheter was then placed through the peel-away sheath with the catheter tip ultimately positioned at the cavoatrial junction. Final catheter positioning was confirmed and documented with a spot radiographic image. The  catheter aspirates and flushes normally. The catheter was flushed with appropriate volume heparin dwells. The catheter exit site was secured with a 0-Prolene retention suture. The venotomy incision was closed with Dermabond. Sterile dressings were applied. The patient tolerated the procedure well without immediate post procedural complication. IMPRESSION: Successful placement of 25 cm tip to cuff tunneled 6 French central venous catheter via the right internal jugular vein with catheter tip terminating at the cavoatrial junction. The catheter is ready for immediate use. Ruthann Cancer, MD [facility] Radiology Electronically Signed   By: Ruthann Cancer M.D.   On: 05/23/2021 12:30   DG Abd Portable 1V  Result Date: 05/23/2021 CLINICAL DATA:  Abdominal pain and distention EXAM: PORTABLE ABDOMEN - 1 VIEW COMPARISON:  05/22/2021 FINDINGS: There is dilation of small-bowel loops measuring up to 4.4 cm in diameter. Stomach is not distended. Tip of enteric tube is seen in the stomach. Side-port in the enteric tube is at the gastroesophageal junction. Colon is not distended. There is biliary stent in the course of common bile duct. Gallbladder stones are seen. IMPRESSION: There is dilation of small-bowel loops suggesting ileus or partial small bowel obstruction. Electronically Signed   By: Elmer Picker M.D.   On: 05/23/2021 09:44   ECHOCARDIOGRAM COMPLETE  Result Date: 05/23/2021    ECHOCARDIOGRAM REPORT   Patient Name:   Patrick [name] Date of Exam: 05/23/2021 Medical Rec #:  [id number]           Height:       71.0 in Accession #:    [id number]          Weight:       141.7 lb Date of Birth:  19-Aug-1955           BSA:          1.821 m Patient Age:    65 years            BP:           122/77 mmHg Patient Gender: M                   HR:           100 bpm. Exam Location:  Inpatient Procedure: 2D Echo, Cardiac Doppler and Color Doppler Indications:    CHF, PHTN   History:        Patient has prior history of Echocardiogram examinations, most                 recent 09/12/2019. CHF, Signs/Symptoms:Chest Pain; Risk                 Factors:Hypertension.  Sonographer:    Glo Herring Referring Phys: [phone] LINDSAY NICOLE Leroy  1. Left ventricular ejection fraction, by estimation, is 40 to 45%. The left ventricle has mildly decreased function. The left ventricle demonstrates global hypokinesis. The left ventricular internal cavity size was moderately dilated. There is moderate  concentric left  ventricular hypertrophy. Left ventricular diastolic parameters are indeterminate.  2. Right ventricular systolic function is mildly reduced. The right ventricular size is severely enlarged. There is moderately elevated pulmonary artery systolic pressure.  3. Left atrial size was severely dilated.  4. Right atrial size was severely dilated.  5. The mitral valve is grossly normal. Severe mitral valve regurgitation.  6. Mechanism appears to be secondary MR; there are multuiple jets that in summation suggestive severe regurgitation; only blunting of the pulmonary vein flow. Consider secondary imaging modality (TEE vs CMR) if procedual intervention is to be considered.  7. The tricuspid valve is abnormal. Tricuspid valve regurgitation is severe.  8. The aortic valve is tricuspid. There is mild calcification of the aortic valve. Aortic valve regurgitation is not visualized. Aortic valve sclerosis is present, with no evidence of aortic valve stenosis.  9. The inferior vena cava is dilated in size with <50% respiratory variability, suggesting right atrial pressure of 15 mmHg. Comparison(s): A prior study was performed on 02/17/20. Prior images reviewed side by side. Similar LV size and dilation. FINDINGS  Left Ventricle: Left ventricular ejection fraction, by estimation, is 40 to 45%. The left ventricle has mildly decreased function. The left ventricle demonstrates global hypokinesis. The  left ventricular internal cavity size was moderately dilated. There is moderate concentric left ventricular hypertrophy. Left ventricular diastolic parameters are indeterminate. Right Ventricle: The right ventricular size is severely enlarged. No increase in right ventricular wall thickness. Right ventricular systolic function is mildly reduced. There is moderately elevated pulmonary artery systolic pressure. The tricuspid regurgitant velocity is 3.15 m/s, and with an assumed right atrial pressure of 15 mmHg, the estimated right ventricular systolic pressure is 63.8 mmHg. Left [facility]: Left atrial size was severely dilated. Right [facility]: Right atrial size was severely dilated. Pericardium: There is no evidence of pericardial effusion. Mitral Valve: There is a secondary MR with multiple jets from ventricular dilation (predominantley at the P2 position) multuple jets; only blunted PV. The mitral valve is grossly normal. Severe mitral valve regurgitation. Tricuspid Valve: The tricuspid valve is abnormal. Tricuspid valve regurgitation is severe. Aortic Valve: The aortic valve is tricuspid. There is mild calcification of the aortic valve. Aortic valve regurgitation is not visualized. Aortic valve sclerosis is present, with no evidence of aortic valve stenosis. Aortic valve mean gradient measures 3.3 mmHg. Aortic valve peak gradient measures 6.2 mmHg. Aortic valve area, by VTI measures 2.44 cm. Pulmonic Valve: The pulmonic valve was grossly normal. Pulmonic valve regurgitation is not visualized. No evidence of pulmonic stenosis. Aorta: The aortic root and ascending aorta are structurally normal, with no evidence of dilitation. Venous: The inferior vena cava is dilated in size with less than 50% respiratory variability, suggesting right atrial pressure of 15 mmHg. IAS/Shunts: The atrial septum is grossly normal.  LEFT VENTRICLE PLAX 2D LVIDd:         6.20 cm      Diastology LVIDs:         4.60 cm      LV e' medial:     10.76 cm/s LV PW:         1.20 cm      LV E/e' medial:  9.8 LV IVS:        1.20 cm      LV e' lateral:   9.86 cm/s LVOT diam:     2.10 cm      LV E/e' lateral: 10.7 LV SV:         56 LV SV  Index:   31 LVOT Area:     3.46 cm  LV Volumes (MOD) LV vol d, MOD A2C: 130.0 ml LV vol d, MOD A4C: 161.0 ml LV vol s, MOD A2C: 74.9 ml LV vol s, MOD A4C: 95.6 ml LV SV MOD A2C:     55.1 ml LV SV MOD A4C:     161.0 ml LV SV MOD BP:      60.0 ml RIGHT VENTRICLE             IVC RV Basal diam:  4.90 cm     IVC diam: 3.10 cm RV S prime:     13.43 cm/s LEFT ATRIUM              Index        RIGHT ATRIUM           Index LA diam:        6.10 cm  3.35 cm/m   RA Area:     30.70 cm LA Vol (A2C):   135.0 ml 74.12 ml/m  RA Volume:   105.00 ml 57.65 ml/m LA Vol (A4C):   141.0 ml 77.42 ml/m LA Biplane Vol: 145.0 ml 79.61 ml/m  AORTIC VALVE                    PULMONIC VALVE AV Area (Vmax):    2.28 cm     PV Vmax:       1.41 m/s AV Area (Vmean):   2.06 cm     PV Peak grad:  8.0 mmHg AV Area (VTI):     2.44 cm AV Vmax:           125.00 cm/s AV Vmean:          87.933 cm/s AV VTI:            0.231 m AV Peak Grad:      6.2 mmHg AV Mean Grad:      3.3 mmHg LVOT Vmax:         82.20 cm/s LVOT Vmean:        52.267 cm/s LVOT VTI:          0.163 m LVOT/AV VTI ratio: 0.70  AORTA Ao Root diam: 3.70 cm Ao Asc diam:  3.40 cm MITRAL VALVE                  TRICUSPID VALVE MV Area (PHT): 4.78 cm       TR Peak grad:   39.7 mmHg MV Decel Time: 159 msec       TR Vmax:        315.00 cm/s MR Peak grad:    99.6 mmHg MR Mean grad:    68.0 mmHg    SHUNTS MR Vmax:         499.00 cm/s  Systemic VTI:  0.16 m MR Vmean:        393.7 cm/s   Systemic Diam: 2.10 cm MR PISA:         2.26 cm MR PISA Eff ROA: 17 mm MR PISA Radius:  0.60 cm MV E velocity: 105.00 cm/s Rudean Haskell MD Electronically signed by Rudean Haskell MD Signature Date/Time: 05/23/2021/12:01:17 PM    Final    IR TUNNELED CENTRAL VENOUS CATHETER PLACEMENT  Result Date:  05/23/2021 INDICATION: 65 year old male with end-stage renal disease requiring central venous access for nutrition supplementation. EXAM: 1. Ultrasound-guided venipuncture of the jugular vein 2. Fluoroscopic guided placement of tunneled central venous  catheter MEDICATIONS: None. ANESTHESIA/SEDATION: Local anesthesia only. FLUOROSCOPY TIME:  0 minutes 48 seconds (3 mGy). COMPLICATIONS: None immediate. PROCEDURE: Informed written consent was obtained from the patient after a discussion of the risks, benefits, and alternatives to treatment. Questions regarding the procedure were encouraged and answered. The right neck and chest were prepped with chlorhexidine in a sterile fashion, and a sterile drape was applied covering the operative field. Maximum barrier sterile technique with sterile gowns and gloves were used for the procedure. A timeout was performed prior to the initiation of the procedure. After creating a small venotomy incision, a 21 gauge micropuncture kit was utilized to access the internal jugular vein. Real-time ultrasound guidance was utilized for vascular access including the acquisition of a permanent ultrasound image documenting patency of the accessed vessel. A Mandril wire to the level of the cavoatrial junction. A 6 French tunneled central venous catheter measuring 25 cm from tip to cuff was tunneled in a retrograde fashion from the anterior chest wall to the venotomy incision. A peel-away sheath was placed over the wire. The catheter was then placed through the peel-away sheath with the catheter tip ultimately positioned at the cavoatrial junction. Final catheter positioning was confirmed and documented with a spot radiographic image. The catheter aspirates and flushes normally. The catheter was flushed with appropriate volume heparin dwells. The catheter exit site was secured with a 0-Prolene retention suture. The venotomy incision was closed with Dermabond. Sterile dressings were applied. The  patient tolerated the procedure well without immediate post procedural complication. IMPRESSION: Successful placement of 25 cm tip to cuff tunneled 6 French central venous catheter via the right internal jugular vein with catheter tip terminating at the cavoatrial junction. The catheter is ready for immediate use. Ruthann Cancer, MD [facility] Radiology Electronically Signed   By: Ruthann Cancer M.D.   On: 05/23/2021 12:30    Anti-infectives: Anti-infectives (From admission, onward)    None        Assessment/Plan Choledocholithiasis s/p ERCP, declined laparoscopic cholecystectomy at [facility] - LFTs overall stable - GI following as well, plan [facility] for [facility] removal in about 3 weeks with repeat ERCP SBO - CT 12/2 and 12/4 with fluid-filled small bowel, CT 12/4 with increased ascites - NGT with thin reddish brown output - pt denies passing flatus or BM except with enema - repeat KUB yesterday with still persistently dilated small bowel - patient needs an operation as this is unlikely to resolve without.  Noted to be very high risk from cardiology standpoint - see yesterday's note with NSQIP risk index - patient started on TPN yesterday  - MD to discuss with pt and family but likely to OR later today    FEN: NPO, NGT to LIWS, TPN VTE: SCDs ID: no current abx   ESRD - TTS CHF A. Fib on eliquis, on hold  LOS: 5 days    Norm Parcel, [facility] Surgery 05/24/2021, 10:24 AM Please see Amion for pager number during day hours 7:00am-4:30pm

## 2021-05-24 NOTE — Progress Notes (Signed)
PHARMACY - TOTAL PARENTERAL NUTRITION CONSULT NOTE   Indication: Small bowel obstruction  Patient Measurements: Height: 5\' 11"  (180.3 cm) Weight: 64.5 kg (142 lb 3.2 oz) IBW/kg (Calculated) : 75.3 TPN AdjBW (KG): 67.6 Body mass index is 19.83 kg/m. Usual Weight: 154 lb 02/28/21  Assessment: 68 yom with ESRD admitted with worsening abdominal pain, found to have high-grade SBO. He had recent admission at [facility] for SBO resolved with conservative management; also had choledocholithiasis s/p ERCP with stone extraction, stent to CBD and PD. Lap chole recommended but declined. NGT placed to LIWS this admit with dark reddish brown output; Surgery reports no flatus or BM except with enema on 12/5 and unlikely to resolve without surgical intervention. Given prolonged NPO status, pharmacy consulted for TPN for nutritional support. At risk for refeeding syndrome.  Glucose / Insulin: no hx DM, CBGs controlled. Utilized 0 units SSI since TPN start Electrolytes: Pre-HD labs 12/7 - K 3.9 (3K HD bath per Nephrology; goal >/=4 with afib), Cl low 95 - stable, Phos high - increased to 6.2, Mag high - up to 2.6, others WNL Renal: ESRD on HD TTS - session pushed to 12/7 AM Hepatic: LFTs / TG WNL, Tbili up to 2.3 (no evidence of jaundice per RN 12/7), albumin 2.4 Intake / Output; MIVF: 100 ml UOP, 300 ml NGT output; LBM 12/1 GI Imaging: 12/3 Abd XR: Probable persistent SBO not well evaluated 12/4 CT A/P: diffusely dilated SB with decompressed terminal ileum c/w SBO 12/6 Abd XR: dilation of small-bowel loops suggesting ileus or pSBO GI Surgeries / Procedures: 12/7 - planned diagnostic laparoscopy, cholecystectomy  Central access: IJ placed by IR 12/6 TPN start date: 12/6  Nutritional Goals: Goal concentrated TPN rate - 65 mL/hr (provides 101 g of protein, 273g dextrose, 58g SMOF lipids, and 1912 kcals per day)  RD Assessment:  Estimated Needs Total Energy Estimated Needs: 1900-2100 Total Protein  Estimated Needs: 100-115 gm Total Fluid Estimated Needs: 1.9 L  Current Nutrition:  NPO and TPN  Plan:  Increase concentrated TPN to 50 mL/hr at 1800. Titrate to goal as tolerated. Monitor for refeeding. TPN will provide 78 g protein and 1470 kCal, meeting 78% of protein and 77% of kCal needs. Electrolytes in TPN: Na 27mEq/L, Ca 34mEq/L, remove K/Mag/Phos for now and replace outside the bag as needed until lytes stable. Cl:Ac at max Cl Add standard MVI and trace elements to TPN. Decrease to 1/2 trace elements if patient has evidence of jaundice (none per RN 12/7). Remove chromium with patient on RRT. Continue very Sensitive q6h SSI and adjust as needed - consider d/c if does not require when advanced to goal TPN rate Monitor TPN labs on Mon/Thurs and PRN F/u Surgery plans post-op   Arturo Morton, PharmD, BCPS Please check AMION for all [place] contact numbers Clinical Pharmacist 05/24/2021 9:28 AM

## 2021-05-24 NOTE — Anesthesia Procedure Notes (Signed)
Arterial Line Insertion Start/End12/12/2020 2:40 PM, 05/24/2021 2:45 PM Performed by: Betha Loa, CRNA, CRNA  Patient location: Pre-op. Preanesthetic checklist: patient identified, IV checked, site marked, risks and benefits discussed, surgical consent, monitors and equipment checked, pre-op evaluation, timeout performed and anesthesia consent Lidocaine 1% used for infiltration Left, radial was placed Catheter size: 20 G Hand hygiene performed  and maximum sterile barriers used   Attempts: 1 Procedure performed without using ultrasound guided technique. Following insertion, Biopatch and dressing applied. Post procedure assessment: normal  Patient tolerated the procedure well with no immediate complications.

## 2021-05-24 NOTE — Progress Notes (Signed)
PROGRESS NOTE  Patrick [name]  YHC:[id number] DOB: 1955/07/14 DOA: 05/19/2021 PCP: Dorena Dew, FNP   Brief Narrative: Patrick [name] is a 65 y.o. male with a history of ESRD (HD TTS), sickle cell anemia, chronic HFrEF, chronic atrial fibrillation on eliquis who presented to the ED 12/2 with SBO which has failed to resolved with conservative measures prompting laparotomy 05/24/2021 after cardiology evaluation with understood high risk for perioperative complications/mortality.   Assessment & Plan: Principal Problem:   SBO (small bowel obstruction) (HCC) Active Problems:   Abdominal pain   Nausea & vomiting   Anemia   End stage renal disease (HCC)   Permanent atrial fibrillation (HCC)   Chronic systolic CHF (congestive heart failure) (HCC)   Protein-calorie malnutrition, severe  SBO:  - Ex lap planned 05/24/2021.  - Continued on NGT, IVF, TPN, IV meds.   Chronic combined HFrEF, NICM, HTN: NYHA III baseline. Echo 12/6 w/LVEF 40-45%, mildly reduced RV systolic function, RVSP 51VOHY, severe MR, severe TR.  - Volume managed with HD and oral torsemide normally (none since admission).  - Holding GDMT due to hypotension.  - Cardiology following, no further testing or treatment to improve high risk for surgery.   Persistent atria fibrillation with RVR:  - Continue amiodarone gtt.  - Holding anticoagulation with hgb drop  Acute blood loss anemic on sickle cell and anemia of ESRD: Recurrent GI bleeds. - s/p 2u PRBCs. Threshold may be 7g/dl in setting of sickle cell anemia and NICM.  - Trend CBC.  - Aranesp per nephrology.   Choledocholithiasis s/p ERCP previously ([facility]) with stone extraction and CBD, PD stents:  - Monitor LFTs, anticipate cholecystectomy during surgery today.  - May need repeat ERCP in 3 weeks.  ESRD:  - Continue HD per nephrology  Pulmonary HTN: Management deferred to cardiology.   DVT prophylaxis: SCDs Code Status: Full Family Communication:  None at bedside. Disposition Plan:  Status is: Inpatient  Consultants:  Cardiology GI General surgery  Procedures:  TBD  Antimicrobials: None   Subjective: Abdominal discomfort actually improved, still not BM or flatus, still significant NG output. No chest pain, dyspnea, new leg swelling.   Objective: Vitals:   05/24/21 1045 05/24/21 1111 05/24/21 1341 05/24/21 1426  BP: 108/73 118/81 115/66 118/67  Pulse: 84 90 (!) 109 87  Resp: _0 (!) 26  Temp: (!) 97.5 F (36.4 C) 97.8 F (36.6 C) 97.7 F (36.5 C) 98.2 F (36.8 C)  TempSrc: Temporal Oral Oral   SpO2: 98% 100% 100% 96%  Weight: 64.5 kg     Height:        Intake/Output Summary (Last 24 hours) at 05/24/2021 1510 Last data filed at 05/24/2021 1510 Gross per 24 hour  Intake 998.68 ml  Output 450 ml  Net 548.68 ml   Filed Weights   05/23/21 0415 05/24/21 0600 05/24/21 1045  Weight: 64.3 kg 64.5 kg 64.5 kg    Gen: 65 y.o. male in no distress  Pulm: Non-labored breathing room air. Clear to auscultation bilaterally.  CV: Regular rate and rhythm. III/VI holosystolic murmur, no rub, or gallop. + JVD, Trace pedal edema. GI: Abdomen mildly tender, distended, not peritonitic, absent BS.  Ext: Warm, no deformities Skin: No rashes, lesions or ulcers on visualized skin Neuro: Alert and oriented. No focal neurological deficits. Psych: Judgement and insight appear normal. Mood & affect appropriate.   Data Reviewed: I have personally reviewed following labs and imaging studies  CBC: Recent Labs  Lab 05/20/21  1420 05/21/21 0248 05/21/21 1540 05/22/21 0252 05/23/21 0131 05/24/21 0400  WBC 6.2 7.7  --  7.2 9.0 8.8  NEUTROABS  --  5.4  --  6.0 6.6 6.8  HGB 6.9* 7.1* 8.4* 7.6* 7.8* 7.5*  HCT 18.8* 20.2* 23.6* 21.0* 22.5* 21.9*  MCV 105.6* 99.5  --  96.8 100.0 100.0  PLT 153 172  --  176 193 403   Basic Metabolic Panel: Recent Labs  Lab 05/20/21 0249 05/21/21 0248 05/22/21 0252 05/23/21 0131  05/24/21 0400  NA 132* 135 135 136 138  K 5.4* 4.2 3.9 3.9 3.9  CL 91* 94* 95* 95* 95*  CO2 _0 GLUCOSE 108* 105* 103* 160* 108*  BUN 51* 33* 59* 75* 81*  CREATININE 5.77* 3.55* 4.85* 5.89* 7.06*  CALCIUM 8.4* 8.1* 8.2* 8.6* 8.6*  MG 2.1 1.9 2.2 2.4 2.6*  PHOS 6.3* 5.5* 5.8* 5.3* 6.2*   GFR: Estimated Creatinine Clearance: 9.8 mL/min (A) (by C-G formula based on SCr of 7.06 mg/dL (H)). Liver Function Tests: Recent Labs  Lab 05/20/21 0249 05/21/21 0248 05/22/21 0252 05/23/21 0131 05/24/21 0400  AST 34 35 27 33 35  ALT _1 ALKPHOS 118 140* 152* 168* 163*  BILITOT 2.7* 3.6* 2.1* 2.0* 2.3*  PROT 5.6* 5.3* 5.1* 5.9* 6.0*  ALBUMIN 2.4* 2.2* 2.1* 2.4* 2.4*   Recent Labs  Lab 05/19/21 1445  LIPASE 51   No results for input(s): AMMONIA in the last 168 hours. Coagulation Profile: Recent Labs  Lab 05/20/21 0249  INR 1.5*   Cardiac Enzymes: No results for input(s): CKTOTAL, CKMB, CKMBINDEX, TROPONINI in the last 168 hours. BNP (last 3 results) No results for input(s): PROBNP in the last 8760 hours. HbA1C: No results for input(s): HGBA1C in the last 72 hours. CBG: Recent Labs  Lab 05/22/21 0743 05/22/21 1158 05/22/21 1551 05/23/21 2349 05/24/21 1154  GLUCAP 105* 99 89 100* 103*   Lipid Profile: Recent Labs    05/23/21 0131  TRIG 73   Thyroid Function Tests: No results for input(s): TSH, T4TOTAL, FREET4, T3FREE, THYROIDAB in the last 72 hours. Anemia Panel: No results for input(s): VITAMINB12, FOLATE, FERRITIN, TIBC, IRON, RETICCTPCT in the last 72 hours. Urine analysis:    Component Value Date/Time   COLORURINE STRAW (A) 06/02/2018 1900   APPEARANCEUR CLEAR 06/02/2018 1900   APPEARANCEUR Clear 05/31/2017 0902   LABSPEC 1.006 06/02/2018 1900   PHURINE 7.0 06/02/2018 1900   GLUCOSEU NEGATIVE 06/02/2018 1900   HGBUR SMALL (A) 06/02/2018 1900   BILIRUBINUR neg 01/26/2020 1124   BILIRUBINUR Negative 05/31/2017 0902   KETONESUR  negative 09/08/2018 1621   KETONESUR NEGATIVE 06/02/2018 1900   PROTEINUR Positive (A) 01/26/2020 1124   PROTEINUR 30 (A) 06/02/2018 1900   UROBILINOGEN 1.0 01/26/2020 1124   UROBILINOGEN 0.2 09/14/2013 1354   NITRITE neg 01/26/2020 1124   NITRITE NEGATIVE 06/02/2018 1900   LEUKOCYTESUR Negative 01/26/2020 1124   LEUKOCYTESUR Negative 05/31/2017 0902   Recent Results (from the past 240 hour(s))  Resp Panel by RT-PCR (Flu A&B, Covid) Nasopharyngeal Swab     Status: None   Collection Time: 05/19/21  6:29 PM   Specimen: Nasopharyngeal Swab; Nasopharyngeal(NP) swabs in vial transport medium  Result Value Ref Range Status   SARS Coronavirus 2 by RT PCR NEGATIVE NEGATIVE Final    Comment: (NOTE) SARS-CoV-2 target nucleic acids are NOT DETECTED.  The SARS-CoV-2 RNA is generally detectable in upper respiratory specimens during the acute phase  of infection. The lowest concentration of SARS-CoV-2 viral copies this assay can detect is 138 copies/mL. A negative result does not preclude SARS-Cov-2 infection and should not be used as the sole basis for treatment or other patient management decisions. A negative result may occur with  improper specimen collection/handling, submission of specimen other than nasopharyngeal swab, presence of viral mutation(s) within the areas targeted by this assay, and inadequate number of viral copies(<138 copies/mL). A negative result must be combined with clinical observations, patient history, and epidemiological information. The expected result is Negative.  Fact Sheet for Patients:  EntrepreneurPulse.com.au  Fact Sheet for Healthcare Providers:  IncredibleEmployment.be  This test is no t yet approved or cleared by the [place] FDA and  has been authorized for detection and/or diagnosis of SARS-CoV-2 by FDA under an Emergency Use Authorization (EUA). This EUA will remain  in effect (meaning this test can be used)  for the duration of the COVID-19 declaration under Section 564(b)(1) of the Act, 21 U.S.C.section 360bbb-3(b)(1), unless the authorization is terminated  or revoked sooner.       Influenza A by PCR NEGATIVE NEGATIVE Final   Influenza B by PCR NEGATIVE NEGATIVE Final    Comment: (NOTE) The Xpert Xpress SARS-CoV-2/FLU/RSV plus assay is intended as an aid in the diagnosis of influenza from Nasopharyngeal swab specimens and should not be used as a sole basis for treatment. Nasal washings and aspirates are unacceptable for Xpert Xpress SARS-CoV-2/FLU/RSV testing.  Fact Sheet for Patients: EntrepreneurPulse.com.au  Fact Sheet for Healthcare Providers: IncredibleEmployment.be  This test is not yet approved or cleared by the [place] FDA and has been authorized for detection and/or diagnosis of SARS-CoV-2 by FDA under an Emergency Use Authorization (EUA). This EUA will remain in effect (meaning this test can be used) for the duration of the COVID-19 declaration under Section 564(b)(1) of the Act, 21 U.S.C. section 360bbb-3(b)(1), unless the authorization is terminated or revoked.  Performed at [facility], [place] [address] [place], [place] [address]       Radiology Studies: IR US Guide Vasc Access Right  Result Date: 05/23/2021 INDICATION: 65 year old male with end-stage renal disease requiring central venous access for nutrition supplementation. EXAM: 1. Ultrasound-guided venipuncture of the jugular vein 2. Fluoroscopic guided placement of tunneled central venous catheter MEDICATIONS: None. ANESTHESIA/SEDATION: Local anesthesia only. FLUOROSCOPY TIME:  0 minutes 48 seconds (3 mGy). COMPLICATIONS: None immediate. PROCEDURE: Informed written consent was obtained from the patient after a discussion of the risks, benefits, and alternatives to treatment. Questions regarding the procedure were encouraged and answered. The right neck and chest  were prepped with chlorhexidine in a sterile fashion, and a sterile drape was applied covering the operative field. Maximum barrier sterile technique with sterile gowns and gloves were used for the procedure. A timeout was performed prior to the initiation of the procedure. After creating a small venotomy incision, a 21 gauge micropuncture kit was utilized to access the internal jugular vein. Real-time ultrasound guidance was utilized for vascular access including the acquisition of a permanent ultrasound image documenting patency of the accessed vessel. A Mandril wire to the level of the cavoatrial junction. A 6 French tunneled central venous catheter measuring 25 cm from tip to cuff was tunneled in a retrograde fashion from the anterior chest wall to the venotomy incision. A peel-away sheath was placed over the wire. The catheter was then placed through the peel-away sheath with the catheter tip ultimately positioned at the cavoatrial junction. Final catheter positioning  was confirmed and documented with a spot radiographic image. The catheter aspirates and flushes normally. The catheter was flushed with appropriate volume heparin dwells. The catheter exit site was secured with a 0-Prolene retention suture. The venotomy incision was closed with Dermabond. Sterile dressings were applied. The patient tolerated the procedure well without immediate post procedural complication. IMPRESSION: Successful placement of 25 cm tip to cuff tunneled 6 French central venous catheter via the right internal jugular vein with catheter tip terminating at the cavoatrial junction. The catheter is ready for immediate use. Ruthann Cancer, MD [facility] Radiology Electronically Signed   By: Ruthann Cancer M.D.   On: 05/23/2021 12:30   DG Abd Portable 1V  Result Date: 05/23/2021 CLINICAL DATA:  Abdominal pain and distention EXAM: PORTABLE ABDOMEN - 1 VIEW COMPARISON:  05/22/2021 FINDINGS:  There is dilation of small-bowel loops measuring up to 4.4 cm in diameter. Stomach is not distended. Tip of enteric tube is seen in the stomach. Side-port in the enteric tube is at the gastroesophageal junction. Colon is not distended. There is biliary stent in the course of common bile duct. Gallbladder stones are seen. IMPRESSION: There is dilation of small-bowel loops suggesting ileus or partial small bowel obstruction. Electronically Signed   By: Elmer Picker M.D.   On: 05/23/2021 09:44   ECHOCARDIOGRAM COMPLETE  Result Date: 05/23/2021    ECHOCARDIOGRAM REPORT   Patient Name:   DANNE VASEK Date of Exam: 05/23/2021 Medical Rec #:  [id number]           Height:       71.0 in Accession #:    [id number]          Weight:       141.7 lb Date of Birth:  06-29-1955           BSA:          1.821 m Patient Age:    54 years            BP:           122/77 mmHg Patient Gender: M                   HR:           100 bpm. Exam Location:  Inpatient Procedure: 2D Echo, Cardiac Doppler and Color Doppler Indications:    CHF, PHTN  History:        Patient has prior history of Echocardiogram examinations, most                 recent 09/12/2019. CHF, Signs/Symptoms:Chest Pain; Risk                 Factors:Hypertension.  Sonographer:    Glo Herring Referring Phys: [phone] LINDSAY NICOLE [place]  1. Left ventricular ejection fraction, by estimation, is 40 to 45%. The left ventricle has mildly decreased function. The left ventricle demonstrates global hypokinesis. The left ventricular internal cavity size was moderately dilated. There is moderate  concentric left ventricular hypertrophy. Left ventricular diastolic parameters are indeterminate.  2. Right ventricular systolic function is mildly reduced. The right ventricular size is severely enlarged. There is moderately elevated pulmonary artery systolic pressure.  3. Left atrial size was severely dilated.  4. Right atrial size was severely dilated.  5. The  mitral valve is grossly normal. Severe mitral valve regurgitation.  6. Mechanism appears to be secondary MR; there are multuiple jets that in summation suggestive severe  regurgitation; only blunting of the pulmonary vein flow. Consider secondary imaging modality (TEE vs CMR) if procedual intervention is to be considered.  7. The tricuspid valve is abnormal. Tricuspid valve regurgitation is severe.  8. The aortic valve is tricuspid. There is mild calcification of the aortic valve. Aortic valve regurgitation is not visualized. Aortic valve sclerosis is present, with no evidence of aortic valve stenosis.  9. The inferior vena cava is dilated in size with <50% respiratory variability, suggesting right atrial pressure of 15 mmHg. Comparison(s): A prior study was performed on 02/17/20. Prior images reviewed side by side. Similar LV size and dilation. FINDINGS  Left Ventricle: Left ventricular ejection fraction, by estimation, is 40 to 45%. The left ventricle has mildly decreased function. The left ventricle demonstrates global hypokinesis. The left ventricular internal cavity size was moderately dilated. There is moderate concentric left ventricular hypertrophy. Left ventricular diastolic parameters are indeterminate. Right Ventricle: The right ventricular size is severely enlarged. No increase in right ventricular wall thickness. Right ventricular systolic function is mildly reduced. There is moderately elevated pulmonary artery systolic pressure. The tricuspid regurgitant velocity is 3.15 m/s, and with an assumed right atrial pressure of 15 mmHg, the estimated right ventricular systolic pressure is 06.2 mmHg. Left [facility]: Left atrial size was severely dilated. Right [facility]: Right atrial size was severely dilated. Pericardium: There is no evidence of pericardial effusion. Mitral Valve: There is a secondary MR with multiple jets from ventricular dilation (predominantley at the P2 position) multuple jets; only blunted PV.  The mitral valve is grossly normal. Severe mitral valve regurgitation. Tricuspid Valve: The tricuspid valve is abnormal. Tricuspid valve regurgitation is severe. Aortic Valve: The aortic valve is tricuspid. There is mild calcification of the aortic valve. Aortic valve regurgitation is not visualized. Aortic valve sclerosis is present, with no evidence of aortic valve stenosis. Aortic valve mean gradient measures 3.3 mmHg. Aortic valve peak gradient measures 6.2 mmHg. Aortic valve area, by VTI measures 2.44 cm. Pulmonic Valve: The pulmonic valve was grossly normal. Pulmonic valve regurgitation is not visualized. No evidence of pulmonic stenosis. Aorta: The aortic root and ascending aorta are structurally normal, with no evidence of dilitation. Venous: The inferior vena cava is dilated in size with less than 50% respiratory variability, suggesting right atrial pressure of 15 mmHg. IAS/Shunts: The atrial septum is grossly normal.  LEFT VENTRICLE PLAX 2D LVIDd:         6.20 cm      Diastology LVIDs:         4.60 cm      LV e' medial:    10.76 cm/s LV PW:         1.20 cm      LV E/e' medial:  9.8 LV IVS:        1.20 cm      LV e' lateral:   9.86 cm/s LVOT diam:     2.10 cm      LV E/e' lateral: 10.7 LV SV:         56 LV SV Index:   31 LVOT Area:     3.46 cm  LV Volumes (MOD) LV vol d, MOD A2C: 130.0 ml LV vol d, MOD A4C: 161.0 ml LV vol s, MOD A2C: 74.9 ml LV vol s, MOD A4C: 95.6 ml LV SV MOD A2C:     55.1 ml LV SV MOD A4C:     161.0 ml LV SV MOD BP:      60.0 ml RIGHT VENTRICLE  IVC RV Basal diam:  4.90 cm     IVC diam: 3.10 cm RV S prime:     13.43 cm/s LEFT ATRIUM              Index        RIGHT ATRIUM           Index LA diam:        6.10 cm  3.35 cm/m   RA Area:     30.70 cm LA Vol (A2C):   135.0 ml 74.12 ml/m  RA Volume:   105.00 ml 57.65 ml/m LA Vol (A4C):   141.0 ml 77.42 ml/m LA Biplane Vol: 145.0 ml 79.61 ml/m  AORTIC VALVE                    PULMONIC VALVE AV Area (Vmax):    2.28 cm     PV  Vmax:       1.41 m/s AV Area (Vmean):   2.06 cm     PV Peak grad:  8.0 mmHg AV Area (VTI):     2.44 cm AV Vmax:           125.00 cm/s AV Vmean:          87.933 cm/s AV VTI:            0.231 m AV Peak Grad:      6.2 mmHg AV Mean Grad:      3.3 mmHg LVOT Vmax:         82.20 cm/s LVOT Vmean:        52.267 cm/s LVOT VTI:          0.163 m LVOT/AV VTI ratio: 0.70  AORTA Ao Root diam: 3.70 cm Ao Asc diam:  3.40 cm MITRAL VALVE                  TRICUSPID VALVE MV Area (PHT): 4.78 cm       TR Peak grad:   39.7 mmHg MV Decel Time: 159 msec       TR Vmax:        315.00 cm/s MR Peak grad:    99.6 mmHg MR Mean grad:    68.0 mmHg    SHUNTS MR Vmax:         499.00 cm/s  Systemic VTI:  0.16 m MR Vmean:        393.7 cm/s   Systemic Diam: 2.10 cm MR PISA:         2.26 cm MR PISA Eff ROA: 17 mm MR PISA Radius:  0.60 cm MV E velocity: 105.00 cm/s Rudean Haskell MD Electronically signed by Rudean Haskell MD Signature Date/Time: 05/23/2021/12:01:17 PM    Final    IR TUNNELED CENTRAL VENOUS CATHETER PLACEMENT  Result Date: 05/23/2021 INDICATION: 65 year old male with end-stage renal disease requiring central venous access for nutrition supplementation. EXAM: 1. Ultrasound-guided venipuncture of the jugular vein 2. Fluoroscopic guided placement of tunneled central venous catheter MEDICATIONS: None. ANESTHESIA/SEDATION: Local anesthesia only. FLUOROSCOPY TIME:  0 minutes 48 seconds (3 mGy). COMPLICATIONS: None immediate. PROCEDURE: Informed written consent was obtained from the patient after a discussion of the risks, benefits, and alternatives to treatment. Questions regarding the procedure were encouraged and answered. The right neck and chest were prepped with chlorhexidine in a sterile fashion, and a sterile drape was applied covering the operative field. Maximum barrier sterile technique with sterile gowns and gloves were used for the procedure. A timeout was performed prior  to the initiation of the procedure. After  creating a small venotomy incision, a 21 gauge micropuncture kit was utilized to access the internal jugular vein. Real-time ultrasound guidance was utilized for vascular access including the acquisition of a permanent ultrasound image documenting patency of the accessed vessel. A Mandril wire to the level of the cavoatrial junction. A 6 French tunneled central venous catheter measuring 25 cm from tip to cuff was tunneled in a retrograde fashion from the anterior chest wall to the venotomy incision. A peel-away sheath was placed over the wire. The catheter was then placed through the peel-away sheath with the catheter tip ultimately positioned at the cavoatrial junction. Final catheter positioning was confirmed and documented with a spot radiographic image. The catheter aspirates and flushes normally. The catheter was flushed with appropriate volume heparin dwells. The catheter exit site was secured with a 0-Prolene retention suture. The venotomy incision was closed with Dermabond. Sterile dressings were applied. The patient tolerated the procedure well without immediate post procedural complication. IMPRESSION: Successful placement of 25 cm tip to cuff tunneled 6 French central venous catheter via the right internal jugular vein with catheter tip terminating at the cavoatrial junction. The catheter is ready for immediate use. Ruthann Cancer, MD [facility] Radiology Electronically Signed   By: Ruthann Cancer M.D.   On: 05/23/2021 12:30    Scheduled Meds:  [MAR Hold] darbepoetin (ARANESP) injection - DIALYSIS  200 mcg Intravenous Q Sat-HD   [MAR Hold] fluticasone furoate-vilanterol  1 puff Inhalation Daily   And   [MAR Hold] umeclidinium bromide  1 puff Inhalation Daily   [MAR Hold] insulin aspart  0-6 Units Subcutaneous Q6H   [MAR Hold] pantoprazole (PROTONIX) IV  40 mg Intravenous Q12H   Continuous Infusions:  sodium chloride 10 mL/hr at 05/24/21 1436    amiodarone 30 mg/hr (05/24/21 1437)   TPN ADULT (ION) 20 mL/hr at 05/24/21 1437   TPN ADULT (ION)       LOS: 5 days   Time spent: 25 minutes.  Patrecia Pour, MD [facility] [email or web address] 05/24/2021, 3:10 PM

## 2021-05-24 NOTE — Progress Notes (Signed)
Pharmacy Antibiotic Note  Patrick [name] is a 65 y.o. male with ESRD on HD admitted on 05/19/2021 with abdominal pain, choledochlolithiasis, SBO.  Pt is S/P lap, small bowel resection with primary anastomosis and open cholecystectomy this afternoon. Pharmacy has been consulted for Zosyn dosing.  WBC 8.8, afebrile  Plan: Zosyn 2.25 gm IV Q 8 hrs Monitor WBC, temp, clinical course, length of therapy  Height: 5\' 11"  (180.3 cm) Weight: 64.5 kg (142 lb 3.2 oz) IBW/kg (Calculated) : 75.3  Temp (24hrs), Avg:97.7 F (36.5 C), Min:97.5 F (36.4 C), Max:98.2 F (36.8 C)  Recent Labs  Lab 05/20/21 0249 05/20/21 1420 05/21/21 0248 05/22/21 0252 05/23/21 0131 05/24/21 0400  WBC 8.0 6.2 7.7 7.2 9.0 8.8  CREATININE 5.77*  --  3.55* 4.85* 5.89* 7.06*    Estimated Creatinine Clearance: 9.8 mL/min (A) (by C-G formula based on SCr of 7.06 mg/dL (H)).    No Known Allergies  Antimicrobials this admission: Cefazolin X 1 pre op 12/7 Metronidazole X 1 12/7 Zosyn 12/7 >>  Microbiology results: 12/2 COVID, flu A, flu B: negative 12/2 HBsAg: negative; HBsAby: positive (immune) 12/3 HIV screen: negative  Thank you for allowing pharmacy to be a part of this patient's care.  Gillermina Hu, PharmD, BCPS, [facility] Clinical Pharmacist 05/24/2021 4:47 PM

## 2021-05-24 NOTE — Progress Notes (Signed)
Critical care attending attestation note:  Patient seen and examined and relevant ancillary tests reviewed.  I agree with the assessment and plan of care as outlined by Mikki Harbor, PAC.   Synopsis of assessment and plan:  65 year old man who is critically ill due to acute hypoxic and hypercarbic respiratory failure requiring mechanical ventilation and septic shock from gangrenous cholecystitis requiring titration of vasopressin.   Received 1882ml intraop.   On examination he is intubated and sedated on propofol, TPN infusing via subclavian PICC. Chest clear HS normal and abdomen open with dressing. Minimal JP drainage. No edema.  BP normal on vasopressin  Assessment:  - Fluid challenge, wean vasopressin to off.  - Continue full ventilatory support overnight.  - Fentanyl and dexmedetomidine for comfort.   CRITICAL CARE Performed by: Kipp Brood   Total critical care time: 40 minutes  Critical care time was exclusive of separately billable procedures and treating other patients.  Critical care was necessary to treat or prevent imminent or life-threatening deterioration.  Critical care was time spent personally by me on the following activities: development of treatment plan with patient and/or surrogate as well as nursing, discussions with consultants, evaluation of patient's response to treatment, examination of patient, obtaining history from patient or surrogate, ordering and performing treatments and interventions, ordering and review of laboratory studies, ordering and review of radiographic studies, pulse oximetry, re-evaluation of patient's condition and participation in multidisciplinary rounds.  Kipp Brood, MD [facility] ICU Physician [place]  Pager: [phone] Mobile: [phone] After hours: [phone].  05/24/2021, 5:22 PM

## 2021-05-24 NOTE — Anesthesia Procedure Notes (Signed)
Anesthesia Regional Block: TAP block   Pre-Anesthetic Checklist: , timeout performed,  Correct Patient, Correct Site, Correct Laterality,  Correct Procedure, Correct Position, site marked,  Risks and benefits discussed,  Surgical consent,  Pre-op evaluation,  At surgeon's request and post-op pain management  Laterality: Right  Prep: chloraprep       Needles:  Injection technique: Single-shot  Needle Type: Echogenic Needle     Needle Length: 9cm  Needle Gauge: 21     Additional Needles:   Narrative:  Start time: 05/24/2021 2:17 PM End time: 05/24/2021 2:24 PM Injection made incrementally with aspirations every 5 mL.  Performed by: Personally  Anesthesiologist: Albertha Ghee, MD  Additional Notes: Pt tolerated the procedure well.

## 2021-05-25 ENCOUNTER — Inpatient Hospital Stay (HOSPITAL_COMMUNITY): Payer: HMO

## 2021-05-25 ENCOUNTER — Encounter (HOSPITAL_COMMUNITY): Payer: Self-pay | Admitting: General Surgery

## 2021-05-25 DIAGNOSIS — R1084 Generalized abdominal pain: Secondary | ICD-10-CM | POA: Diagnosis not present

## 2021-05-25 DIAGNOSIS — I5022 Chronic systolic (congestive) heart failure: Secondary | ICD-10-CM | POA: Diagnosis not present

## 2021-05-25 DIAGNOSIS — K56609 Unspecified intestinal obstruction, unspecified as to partial versus complete obstruction: Secondary | ICD-10-CM | POA: Diagnosis not present

## 2021-05-25 DIAGNOSIS — D62 Acute posthemorrhagic anemia: Secondary | ICD-10-CM

## 2021-05-25 DIAGNOSIS — N186 End stage renal disease: Secondary | ICD-10-CM | POA: Diagnosis not present

## 2021-05-25 LAB — COMPREHENSIVE METABOLIC PANEL
ALT: 16 U/L (ref 0–44)
AST: 32 U/L (ref 15–41)
Albumin: 1.9 g/dL — ABNORMAL LOW (ref 3.5–5.0)
Alkaline Phosphatase: 110 U/L (ref 38–126)
Anion gap: 11 (ref 5–15)
BUN: 30 mg/dL — ABNORMAL HIGH (ref 8–23)
CO2: 25 mmol/L (ref 22–32)
Calcium: 8 mg/dL — ABNORMAL LOW (ref 8.9–10.3)
Chloride: 98 mmol/L (ref 98–111)
Creatinine, Ser: 3.69 mg/dL — ABNORMAL HIGH (ref 0.61–1.24)
GFR, Estimated: 18 mL/min — ABNORMAL LOW (ref >60)
Glucose, Bld: 210 mg/dL — ABNORMAL HIGH (ref 70–99)
Potassium: 3.6 mmol/L (ref 3.5–5.1)
Sodium: 134 mmol/L — ABNORMAL LOW (ref 135–145)
Total Bilirubin: 1.5 mg/dL — ABNORMAL HIGH (ref 0.3–1.2)
Total Protein: 4.8 g/dL — ABNORMAL LOW (ref 6.5–8.1)

## 2021-05-25 LAB — HEMOGLOBIN AND HEMATOCRIT, BLOOD
HCT: 24.4 % — ABNORMAL LOW (ref 39.0–52.0)
Hemoglobin: 8.7 g/dL — ABNORMAL LOW (ref 13.0–17.0)

## 2021-05-25 LAB — CBC
HCT: 19 % — ABNORMAL LOW (ref 39.0–52.0)
Hemoglobin: 6.9 g/dL — CL (ref 13.0–17.0)
MCH: 33.8 pg (ref 26.0–34.0)
MCHC: 36.3 g/dL — ABNORMAL HIGH (ref 30.0–36.0)
MCV: 93.1 fL (ref 80.0–100.0)
Platelets: 113 10*3/uL — ABNORMAL LOW (ref 150–400)
RBC: 2.04 MIL/uL — ABNORMAL LOW (ref 4.22–5.81)
RDW: 24.5 % — ABNORMAL HIGH (ref 11.5–15.5)
WBC: 11.3 10*3/uL — ABNORMAL HIGH (ref 4.0–10.5)
nRBC: 2.5 % — ABNORMAL HIGH (ref 0.0–0.2)

## 2021-05-25 LAB — PREPARE FRESH FROZEN PLASMA: Unit division: 0

## 2021-05-25 LAB — GLUCOSE, CAPILLARY
Glucose-Capillary: 207 mg/dL — ABNORMAL HIGH (ref 70–99)
Glucose-Capillary: 189 mg/dL — ABNORMAL HIGH (ref 70–99)
Glucose-Capillary: 209 mg/dL — ABNORMAL HIGH (ref 70–99)
Glucose-Capillary: 196 mg/dL — ABNORMAL HIGH (ref 70–99)
Glucose-Capillary: 140 mg/dL — ABNORMAL HIGH (ref 70–99)
Glucose-Capillary: 121 mg/dL — ABNORMAL HIGH (ref 70–99)
Glucose-Capillary: 100 mg/dL — ABNORMAL HIGH (ref 70–99)

## 2021-05-25 LAB — BPAM FFP
Blood Product Expiration Date: 202212081719
Blood Product Expiration Date: 202212122359
ISSUE DATE / TIME: 202212071756
ISSUE DATE / TIME: 202212071756
Unit Type and Rh: 8400
Unit Type and Rh: 8400

## 2021-05-25 LAB — MAGNESIUM: Magnesium: 1.9 mg/dL (ref 1.7–2.4)

## 2021-05-25 LAB — PHOSPHORUS: Phosphorus: 4.1 mg/dL (ref 2.5–4.6)

## 2021-05-25 LAB — PREPARE RBC (CROSSMATCH)

## 2021-05-25 MED ORDER — HYDRALAZINE HCL 20 MG/ML IJ SOLN: 10.0000 mg | Freq: Three times a day (TID) | INTRAMUSCULAR | Status: DC | PRN

## 2021-05-25 MED ORDER — HYDROMORPHONE HCL 1 MG/ML IJ SOLN
0.5000 mg | INTRAMUSCULAR | Status: DC | PRN
  Administered 2021-05-25: 0.5 mg via INTRAVENOUS
  Filled 2021-05-25: qty 0.5

## 2021-05-25 MED ORDER — ORAL CARE MOUTH RINSE
15.0000 mL | Freq: Two times a day (BID) | OROMUCOSAL | Status: DC
  Administered 2021-05-25 – 2021-06-09 (×26): 15 mL via OROMUCOSAL

## 2021-05-25 MED ORDER — HYDRALAZINE HCL 10 MG PO TABS: 10.0000 mg | ORAL_TABLET | Freq: Three times a day (TID) | ORAL | Status: DC

## 2021-05-25 MED ORDER — DIPHENHYDRAMINE HCL 12.5 MG/5ML PO ELIX
12.5000 mg | ORAL_SOLUTION | Freq: Four times a day (QID) | ORAL | Status: DC | PRN
  Filled 2021-05-25: qty 5

## 2021-05-25 MED ORDER — ACETAMINOPHEN 10 MG/ML IV SOLN
1000.0000 mg | Freq: Four times a day (QID) | INTRAVENOUS | Status: DC
  Filled 2021-05-25: qty 100

## 2021-05-25 MED ORDER — POTASSIUM CHLORIDE 10 MEQ/50ML IV SOLN
10.0000 meq | Freq: Once | INTRAVENOUS | Status: AC
  Administered 2021-05-25: 10 meq via INTRAVENOUS
  Filled 2021-05-25: qty 50

## 2021-05-25 MED ORDER — HYDROMORPHONE 1 MG/ML IV SOLN
INTRAVENOUS | Status: AC
  Administered 2021-05-25: 1.2 mg via INTRAVENOUS
  Administered 2021-05-26: 0.9 mg via INTRAVENOUS
  Administered 2021-05-26: 1.8 mg via INTRAVENOUS
  Administered 2021-05-26: 6.3 mg via INTRAVENOUS
  Administered 2021-05-26: 1.2 mg via INTRAVENOUS
  Administered 2021-05-26: 1.8 mg via INTRAVENOUS
  Administered 2021-05-26: 1.5 mg via INTRAVENOUS
  Administered 2021-05-27: 5 mg via INTRAVENOUS
  Administered 2021-05-27 (×2): 2.4 mg via INTRAVENOUS
  Administered 2021-05-27: 3.6 mg via INTRAVENOUS
  Administered 2021-05-28: 2.1 mg via INTRAVENOUS
  Administered 2021-05-28: 3.3 mg via INTRAVENOUS
  Administered 2021-05-28: 2.1 mg via INTRAVENOUS
  Administered 2021-05-28: 5.4 mg via INTRAVENOUS
  Administered 2021-05-28: 2.1 mg via INTRAVENOUS
  Administered 2021-05-28: 2.7 mg via INTRAVENOUS
  Administered 2021-05-29: 3 mg via INTRAVENOUS
  Administered 2021-05-29: 2.1 mg via INTRAVENOUS
  Administered 2021-05-30: 3.6 mg via INTRAVENOUS
  Administered 2021-05-30: 0 mL via INTRAVENOUS
  Administered 2021-05-30: 3.6 mg via INTRAVENOUS
  Administered 2021-05-30: 7.8 mg via INTRAVENOUS
  Administered 2021-05-30: 7.2 mL via INTRAVENOUS
  Administered 2021-05-31: 4.8 mg via INTRAVENOUS
  Administered 2021-05-31: 3.6 mg via INTRAVENOUS
  Administered 2021-05-31: 1.8 mg via INTRAVENOUS
  Administered 2021-05-31: 2.7 mg via INTRAVENOUS
  Administered 2021-05-31: 7.2 mg via INTRAVENOUS
  Administered 2021-06-01: 5.3 mg via INTRAVENOUS
  Administered 2021-06-01: 3.9 mg via INTRAVENOUS
  Filled 2021-05-25 (×6): qty 30

## 2021-05-25 MED ORDER — TRACE MINERALS CU-MN-SE-ZN 300-55-60-3000 MCG/ML IV SOLN
INTRAVENOUS | Status: AC
  Filled 2021-05-25: qty 676

## 2021-05-25 MED ORDER — TRACE MINERALS CU-MN-SE-ZN 300-55-60-3000 MCG/ML IV SOLN
INTRAVENOUS | Status: DC
  Filled 2021-05-25: qty 676

## 2021-05-25 MED ORDER — NALOXONE HCL 0.4 MG/ML IJ SOLN: 0.4000 mg | INTRAMUSCULAR | Status: DC | PRN

## 2021-05-25 MED ORDER — INSULIN ASPART 100 UNIT/ML IJ SOLN
0.0000 [IU] | INTRAMUSCULAR | Status: DC
  Administered 2021-05-25: 3 [IU] via SUBCUTANEOUS
  Administered 2021-05-25 – 2021-05-26 (×3): 2 [IU] via SUBCUTANEOUS

## 2021-05-25 MED ORDER — SODIUM CHLORIDE 0.9% IV SOLUTION: Freq: Once | INTRAVENOUS | Status: AC

## 2021-05-25 MED ORDER — HYDRALAZINE HCL 10 MG PO TABS
10.0000 mg | ORAL_TABLET | Freq: Three times a day (TID) | ORAL | Status: DC
Start: 2021-05-25 — End: 2021-05-25
  Administered 2021-05-25: 10 mg
  Filled 2021-05-25: qty 1

## 2021-05-25 MED ORDER — SODIUM CHLORIDE 0.9% FLUSH: 9.0000 mL | INTRAVENOUS | Status: DC | PRN

## 2021-05-25 MED ORDER — HYDROMORPHONE 1 MG/ML IV SOLN: INTRAVENOUS | Status: DC

## 2021-05-25 MED ORDER — METHOCARBAMOL 1000 MG/10ML IJ SOLN
500.0000 mg | Freq: Three times a day (TID) | INTRAVENOUS | Status: DC
  Administered 2021-05-25 – 2021-05-27 (×6): 500 mg via INTRAVENOUS
  Filled 2021-05-25 (×3): qty 5
  Filled 2021-05-25: qty 500
  Filled 2021-05-25 (×5): qty 5

## 2021-05-25 MED ORDER — DIPHENHYDRAMINE HCL 50 MG/ML IJ SOLN: 12.5000 mg | Freq: Four times a day (QID) | INTRAMUSCULAR | Status: DC | PRN

## 2021-05-25 NOTE — Procedures (Signed)
Extubation Procedure Note  Patient Details:   Name: Patrick [name] DOB: 10-17-1955 MRN: [id number]   Airway Documentation:    Vent end date: 05/25/21 Vent end time: 1011   Evaluation  O2 sats: stable throughout Complications: No apparent complications Patient did tolerate procedure well. Bilateral Breath Sounds: Clear, Diminished   Yes   Walfred Bettendorf 05/25/2021, 10:12 AM

## 2021-05-25 NOTE — Progress Notes (Signed)
Physical Therapy Treatment Patient Details Name: Patrick [name] MRN: [id number] DOB: February 22, 1956 Today's Date: 05/25/2021   History of Present Illness 65 y.o. male admitted 12/2 with SBO. Pt recently hospitalized 11/22-11/26 at [facility] in [place] for same reason s/p ERCP. 12/7 pt to OR for small bowel resection and open cholecystectomy without closure and remained on vent, extubated 12/8.  PMHx: ESRD  on HD, anemia of CKD, CHF, Afib    PT Comments    Pt very pleasant and happy to be extubated and moving despite pain. Pt received PCA prior to session and utilized during therapy. Pt apprehensive for movement but able to stand and step to chair as well as perform bil LE HEP. Cousin present during session and RN aware of mobility. Goals and D/C plan remain appropriate.   95% on 4L [place] HR 70 BP 174/92    Recommendations for follow up therapy are one component of a multi-disciplinary discharge planning process, led by the attending physician.  Recommendations may be updated based on patient status, additional functional criteria and insurance authorization.  Follow Up Recommendations  Skilled nursing-short term rehab (<3 hours/day)     Assistance Recommended at Discharge Intermittent Supervision/Assistance  Equipment Recommendations  BSC/3in1;Rolling walker (2 wheels)    Recommendations for Other Services       Precautions / Restrictions Precautions Precautions: Fall;Other (comment) Precaution Comments: blind, NGT, watch HR (a fib), open abdomen, PCA, art line Restrictions Weight Bearing Restrictions: No     Mobility  Bed Mobility Overal bed mobility: Needs Assistance Bed Mobility: Rolling;Sidelying to Sit Rolling: Min assist Sidelying to sit: Min assist;+2 for safety/equipment       General bed mobility comments: cues for sequence with assist of pad, physical assist to clear legs and lift trunk with multimodal cues and HOB 30 degrees    Transfers Overall  transfer level: Needs assistance   Transfers: Sit to/from Stand;Bed to chair/wheelchair/BSC Sit to Stand: Min assist     Step pivot transfers: Min assist;+2 safety/equipment     General transfer comment: min assist to rise from surface with bil UE support and bil HHA to step grossly 3' from bed to chair. assist for lines and direction    Ambulation/Gait               General Gait Details: pt declined attempting   Stairs             Wheelchair Mobility    Modified Rankin (Stroke Patients Only)       Balance Overall balance assessment: Mild deficits observed, not formally tested                                          Cognition Arousal/Alertness: Awake/alert Behavior During Therapy: Flat affect Overall Cognitive Status: Within Functional Limits for tasks assessed                                          Exercises General Exercises - Lower Extremity Long Arc Quad: AROM;Both;Seated;10 reps Hip Flexion/Marching: AROM;Both;Seated;10 reps    General Comments        Pertinent Vitals/Pain Faces Pain Scale: Hurts whole lot Pain Location: abdomen Pain Descriptors / Indicators: Grimacing Pain Intervention(s): Limited activity within patient's tolerance;Monitored during session;PCA encouraged;Repositioned    Home Living  Prior Function            PT Goals (current goals can now be found in the care plan section) Progress towards PT goals: Progressing toward goals    Frequency    Min 3X/week      PT Plan Current plan remains appropriate    Co-evaluation PT/OT/SLP Co-Evaluation/Treatment: Yes Reason for Co-Treatment: For patient/therapist safety PT goals addressed during session: Mobility/safety with mobility        AM-PAC PT "6 Clicks" Mobility   Outcome Measure  Help needed turning from your back to your side while in a flat bed without using bedrails?: A  Little Help needed moving from lying on your back to sitting on the side of a flat bed without using bedrails?: A Little Help needed moving to and from a bed to a chair (including a wheelchair)?: A Lot Help needed standing up from a chair using your arms (e.g., wheelchair or bedside chair)?: A Little Help needed to walk in hospital room?: A Lot Help needed climbing 3-5 steps with a railing? : Total 6 Click Score: 14    End of Session   Activity Tolerance: Patient limited by pain Patient left: in chair;with call bell/phone within reach;with family/visitor present;with nursing/sitter in room;with chair alarm set Nurse Communication: Mobility status PT Visit Diagnosis: Muscle weakness (generalized) (M62.81);Difficulty in walking, not elsewhere classified (R26.2);Other abnormalities of gait and mobility (R26.89)     Time: 0321-2248 PT Time Calculation (min) (ACUTE ONLY): 32 min  Charges:  $Therapeutic Activity: 8-22 mins                     Aeon Koors P, PT Acute Rehabilitation Services Pager: [phone] Office: [phone]    Julie-Anne Torain B Samel Bruna 05/25/2021, 1:30 PM

## 2021-05-25 NOTE — Progress Notes (Signed)
Occupational Therapy Treatment Patient Details Name: Patrick [name] MRN: [id number] DOB: April 11, 1956 Today's Date: 05/25/2021   History of present illness 65 y.o. male admitted 12/2 with SBO. Pt recently hospitalized 11/22-11/26 at [facility] in [place] for same reason s/p ERCP. 12/7 pt to OR for small bowel resection and open cholecystectomy without closure and remained on vent, extubated 12/8.  PMHx: ESRD  on HD, anemia of CKD, CHF, Afib   OT comments  Pt s/p surgery, extubated today.  Completing transfers with min assist +2 for safety.  Limited by pain throughout session, but utilizing new PCA pump to help manage pain, and requires increased assist for LB ADLs to max assist. Updated goals and dc plan to SNF at dc.  Will follow.     Recommendations for follow up therapy are one component of a multi-disciplinary discharge planning process, led by the attending physician.  Recommendations may be updated based on patient status, additional functional criteria and insurance authorization.    Follow Up Recommendations  Skilled nursing-short term rehab (<3 hours/day)    Assistance Recommended at Discharge Frequent or constant Supervision/Assistance  Equipment Recommendations  Other (comment) (TBD)    Recommendations for Other Services      Precautions / Restrictions Precautions Precautions: Fall;Other (comment) Precaution Comments: blind, NGT, watch HR (a fib), open abdomen, PCA, art line Restrictions Weight Bearing Restrictions: No       Mobility Bed Mobility Overal bed mobility: Needs Assistance Bed Mobility: Rolling;Sidelying to Sit Rolling: Min assist Sidelying to sit: Min assist;+2 for safety/equipment       General bed mobility comments: cues for sequence with assist of pad, physical assist to clear legs and lift trunk with multimodal cues and HOB 30 degrees    Transfers Overall transfer level: Needs assistance   Transfers: Sit to/from Stand;Bed to  chair/wheelchair/BSC Sit to Stand: Min assist   Step pivot transfers: Min assist;+2 safety/equipment       General transfer comment: min assist to rise from surface with bil UE support and bil HHA to step grossly 3' from bed to chair. assist for lines and direction     Balance Overall balance assessment: Mild deficits observed, not formally tested                                         ADL either performed or assessed with clinical judgement   ADL Overall ADL's : Needs assistance/impaired Eating/Feeding: NPO                   Lower Body Dressing: Maximal assistance;Sit to/from stand Lower Body Dressing Details (indicate cue type and reason): requires assist for socks, min assist +2 in standing Toilet Transfer: Minimal assistance;+2 for safety/equipment           Functional mobility during ADLs: Minimal assistance;+2 for safety/equipment      Extremity/Trunk Assessment              Vision   Additional Comments: legally blind- able to see "light" on PCA pump to know when he can press it   Perception     Praxis      Cognition Arousal/Alertness: Awake/alert Behavior During Therapy: Flat affect Overall Cognitive Status: Within Functional Limits for tasks assessed  Exercises General Exercises - Lower Extremity Long Arc Quad: AROM;Both;Seated;10 reps Hip Flexion/Marching: AROM;Both;Seated;10 reps   Shoulder Instructions       General Comments      Pertinent Vitals/ Pain       Pain Assessment: Faces Faces Pain Scale: Hurts whole lot Pain Location: abdomen Pain Descriptors / Indicators: Grimacing Pain Intervention(s): Limited activity within patient's tolerance;Monitored during session;Repositioned;PCA encouraged  Home Living                                          Prior Functioning/Environment              Frequency  Min 2X/week         Progress Toward Goals  OT Goals(current goals can now be found in the care plan section)  Progress towards OT goals: Progressing toward goals  Acute Rehab OT Goals Patient Stated Goal: less pain OT Goal Formulation: With patient Time For Goal Achievement: 06/05/21 Potential to Achieve Goals: Good  Plan Discharge plan needs to be updated;Frequency remains appropriate    Co-evaluation    PT/OT/SLP Co-Evaluation/Treatment: Yes Reason for Co-Treatment: For patient/therapist safety;To address functional/ADL transfers PT goals addressed during session: Mobility/safety with mobility OT goals addressed during session: ADL's and self-care      AM-PAC OT "6 Clicks" Daily Activity     Outcome Measure   Help from another person eating meals?: Total Help from another person taking care of personal grooming?: A Little Help from another person toileting, which includes using toliet, bedpan, or urinal?: A Lot Help from another person bathing (including washing, rinsing, drying)?: A Lot Help from another person to put on and taking off regular upper body clothing?: A Little Help from another person to put on and taking off regular lower body clothing?: A Lot 6 Click Score: 13    End of Session Equipment Utilized During Treatment: Oxygen  OT Visit Diagnosis: Other abnormalities of gait and mobility (R26.89);Pain Pain - part of body:  (abdomen)   Activity Tolerance Patient limited by pain   Patient Left in chair;with call bell/phone within reach;with chair alarm set (PT still present)   Nurse Communication Mobility status        Time: 2094-7096 OT Time Calculation (min): 28 min  Charges: OT General Charges $OT Visit: 1 Visit OT Treatments $Self Care/Home Management : 8-22 mins  Jolaine Artist, [place] Pager [phone] Office [phone]   Patrick [name] 05/25/2021, 2:16 PM

## 2021-05-25 NOTE — Progress Notes (Signed)
PHARMACY - TOTAL PARENTERAL NUTRITION CONSULT NOTE  Indication: Small bowel obstruction  Patient Measurements: Height: 5\' 11"  (180.3 cm) Weight: 65.1 kg (143 lb 8.3 oz) IBW/kg (Calculated) : 75.3 TPN AdjBW (KG): 67.6 Body mass index is 20.02 kg/m. Usual Weight: 154 lb 02/28/21  Assessment:  83 yom with ESRD admitted with worsening abdominal pain, found to have high-grade SBO. He had recent admission at [facility] for SBO resolved with conservative management; also had choledocholithiasis s/p ERCP with stone extraction, stent to CBD and PD. Lap chole recommended but declined. Surgery reports no flatus or BM except with enema on 12/5 and unlikely to resolve without surgical intervention. Given prolonged NPO status, pharmacy consulted for TPN for nutritional support. At risk for refeeding syndrome.  Glucose / Insulin: no hx DM, CBGs elevated post-op, possibly d/t stress Utilized 1 unit SSI Electrolytes: low Na, K 3.6 (goal >/= 4 for Afib/ileus), Mag 1.9 (goal >/= 2 for Afib/ileus), others WNL Renal: ESRD on HD TTS - session pushed to 12/7 AM Hepatic: LFTs / TG WNL, tbili down to 1.5 (no evidence of jaundice per RN 12/7), albumin 1.9 Intake / Output; MIVF: drain 80mL, net +2.5L, LBM 12/1 GI Imaging:  12/3 Abd XR: Probable persistent SBO not well evaluated 12/4 CT A/P: diffusely dilated SB with decompressed terminal ileum c/w SBO 12/6 Abd XR: dilation of small-bowel loops suggesting ileus or pSBO GI Surgeries / Procedures:  12/7 - planned diagnostic laparoscopy, cholecystectomy  Central access: IJ placed by IR 05/23/21 TPN start date: 05/23/21  Nutritional Goals: RD Estimated Needs Total Energy Estimated Needs: 1900-2100 Total Protein Estimated Needs: 100-115 gm Total Fluid Estimated Needs: 1.9 L  Current Nutrition:  TPN  Plan:  Increase concentrated TPN to goal rate of 65 mL/hr at 1800 TPN will provide 101g AA, 273g CHO and 58g ILE for a total of 1912 kCal, meeting 100% of  needs Electrolytes in TPN: increase Na to 76mEq/L, K 49mEq/L for today per discussion with CCM, Ca 81mEq/L, add Mag 9mEq/L, Phos 35mmol/L, max CL Add standard MVI and trace elements to TPN; remove chromium while on RRT. Increase SSI to moderate Q4H for now KCL x 1 run (no HD today) F/U AM labs, HD schedule/tolerance, CBGs  Patrick [name], PharmD, BCPS, [place] 05/25/2021, 11:18 AM

## 2021-05-25 NOTE — Progress Notes (Signed)
eLink Physician-Brief Progress Note Patient Name: Patrick [name] DOB: 1955/07/23 MRN: [id number]   Date of Service  05/25/2021  HPI/Events of Note  Anemia - Hgb = 6.9.   eICU Interventions  Will transfuse 1 unit PRBC now.      Intervention Category Major Interventions: Other:  Dorise Gangi Cornelia Copa 05/25/2021, 7:02 AM

## 2021-05-25 NOTE — Anesthesia Postprocedure Evaluation (Signed)
Anesthesia Post Note  Patient: Patrick [name]  Procedure(s) Performed: LAPAROSCOPY DIAGNOSTIC, CHOLECYSTECTOMY (Abdomen) POSSIBLE EXPLORATORY LAPAROTOMY (Abdomen) SMALL BOWEL RESECTION (Abdomen)     Patient location during evaluation: SICU Anesthesia Type: General Level of consciousness: sedated Pain management: pain level controlled Vital Signs Assessment: post-procedure vital signs reviewed and stable Respiratory status: patient remains intubated per anesthesia plan Cardiovascular status: stable Postop Assessment: no apparent nausea or vomiting Anesthetic complications: no   No notable events documented.  Last Vitals:  Vitals:   05/25/21 0500 05/25/21 0600  BP: (!) 107/53 (!) 101/56  Pulse: (!) 50 (!) 50  Resp: 15 16  Temp:    SpO2: 100% 100%    Last Pain:  Vitals:   05/25/21 0400  TempSrc: Axillary                 Belenda Cruise P Emanii Bugbee

## 2021-05-25 NOTE — Progress Notes (Signed)
Progress Note  1 Day Post-Op  Subjective: Awake on vent.  Weaning well currently on pressure support.  Pain is "so-so" right now.    Objective: Vital signs in last 24 hours: Temp:  [97.5 F (36.4 C)-98.6 F (37 C)] 97.9 F (36.6 C) (12/08 0400) Pulse Rate:  [46-109] 46 (12/08 0730) Resp:  [15-26] 16 (12/08 0730) BP: (100-126)/(53-81) 106/53 (12/08 0700) SpO2:  [96 %-100 %] 100 % (12/08 0730) Arterial Line BP: (113-141)/(41-60) 113/41 (12/08 0730) FiO2 (%):  [30 %-100 %] 30 % (12/08 0741) Weight:  [64.5 kg-65.1 kg] 65.1 kg (12/08 0500) Last BM Date: 05/22/21  Intake/Output from previous day: 12/07 0701 - 12/08 0700 In: 4440 [I.V.:2252.1; Blood:1187; IV Piggyback:1000.9] Out: 325 [Drains:25; Blood:300] Intake/Output this shift: No intake/output data recorded.  PE: General: NAD, alert on vent Heart: brady in 40-50s Lungs: CTAB, on vent Abd: soft, appropriately tender, NGT in place with some bilious output, midline incision is clean and packed.  JP drain with minimal thin serosang output.   Lab Results:  Recent Labs    05/24/21 1855 05/25/21 0609  WBC 5.6 11.3*  HGB 7.9* 6.9*  HCT 22.6* 19.0*  PLT 145* 113*   BMET Recent Labs    05/24/21 1855 05/25/21 0609  NA 137 134*  K 3.3* 3.6  CL 100 98  CO2 26 25  GLUCOSE 140* 210*  BUN 21 30*  CREATININE 2.89* 3.69*  CALCIUM 7.7* 8.0*   PT/INR Recent Labs    05/24/21 1855  LABPROT 15.6*  INR 1.2   CMP     Component Value Date/Time   NA 134 (L) 05/25/2021 0609   NA 135 05/24/2020 1119   K 3.6 05/25/2021 0609   CL 98 05/25/2021 0609   CO2 25 05/25/2021 0609   GLUCOSE 210 (H) 05/25/2021 0609   BUN 30 (H) 05/25/2021 0609   BUN 25 05/24/2020 1119   CREATININE 3.69 (H) 05/25/2021 0609   CREATININE 2.90 (H) 08/11/2015 1359   CALCIUM 8.0 (L) 05/25/2021 0609   PROT 4.8 (L) 05/25/2021 0609   PROT 7.5 05/24/2020 1119   ALBUMIN 1.9 (L) 05/25/2021 0609   ALBUMIN 4.1 05/24/2020 1119   AST 32 05/25/2021 0609    ALT 16 05/25/2021 0609   ALKPHOS 110 05/25/2021 0609   BILITOT 1.5 (H) 05/25/2021 0609   BILITOT 2.4 (H) 05/24/2020 1119   GFRNONAA 18 (L) 05/25/2021 0609   GFRNONAA 23 (L) 08/11/2015 1359   GFRAA 24 (L) 05/24/2020 1119   GFRAA 27 (L) 08/11/2015 1359   Lipase     Component Value Date/Time   LIPASE 51 05/19/2021 1445       Studies/Results: IR US Guide Vasc Access Right  Result Date: 05/23/2021 INDICATION: 65 year old male with end-stage renal disease requiring central venous access for nutrition supplementation. EXAM: 1. Ultrasound-guided venipuncture of the jugular vein 2. Fluoroscopic guided placement of tunneled central venous catheter MEDICATIONS: None. ANESTHESIA/SEDATION: Local anesthesia only. FLUOROSCOPY TIME:  0 minutes 48 seconds (3 mGy). COMPLICATIONS: None immediate. PROCEDURE: Informed written consent was obtained from the patient after a discussion of the risks, benefits, and alternatives to treatment. Questions regarding the procedure were encouraged and answered. The right neck and chest were prepped with chlorhexidine in a sterile fashion, and a sterile drape was applied covering the operative field. Maximum barrier sterile technique with sterile gowns and gloves were used for the procedure. A timeout was performed prior to the initiation of the procedure. After creating a small venotomy incision, a 21 gauge  micropuncture kit was utilized to access the internal jugular vein. Real-time ultrasound guidance was utilized for vascular access including the acquisition of a permanent ultrasound image documenting patency of the accessed vessel. A Mandril wire to the level of the cavoatrial junction. A 6 French tunneled central venous catheter measuring 25 cm from tip to cuff was tunneled in a retrograde fashion from the anterior chest wall to the venotomy incision. A peel-away sheath was placed over the wire. The catheter was then placed through the peel-away sheath with the catheter  tip ultimately positioned at the cavoatrial junction. Final catheter positioning was confirmed and documented with a spot radiographic image. The catheter aspirates and flushes normally. The catheter was flushed with appropriate volume heparin dwells. The catheter exit site was secured with a 0-Prolene retention suture. The venotomy incision was closed with Dermabond. Sterile dressings were applied. The patient tolerated the procedure well without immediate post procedural complication. IMPRESSION: Successful placement of 25 cm tip to cuff tunneled 6 French central venous catheter via the right internal jugular vein with catheter tip terminating at the cavoatrial junction. The catheter is ready for immediate use. Ruthann Cancer, MD [facility] Radiology Electronically Signed   By: Ruthann Cancer M.D.   On: 05/23/2021 12:30   DG Chest Port 1 View  Result Date: 05/25/2021 CLINICAL DATA:  Acute respiratory failure with hypoxia. EXAM: PORTABLE CHEST 1 VIEW COMPARISON:  05/24/2021. FINDINGS: The heart is enlarged and the pulmonary vasculature is distended. Atherosclerotic calcification of the aorta is noted. Mild airspace disease is present at the left lung base. No effusion or pneumothorax is seen. An enteric tube courses over the left upper quadrant and out of the field of view. The endotracheal tube terminates 3.4 cm above the carina. A right internal jugular central venous catheter is stable in position. IMPRESSION: 1. Cardiomegaly with pulmonary vascular congestion. 2. Minimal airspace disease at the left lung base, possible atelectasis, edema, or infiltrate. 3. Medical devices as described above. Electronically Signed   By: Brett Fairy M.D.   On: 05/25/2021 04:21   DG Chest Port 1 View  Result Date: 05/24/2021 CLINICAL DATA:  Status post intubation. EXAM: PORTABLE CHEST 1 VIEW COMPARISON:  Chest radiograph dated 08/01/2020. FINDINGS: Endotracheal tube with tip  approximately 6 cm above the carina. Enteric tube extends below the diaphragm with tip in the proximal stomach. Right IJ central venous line with tip at the cavoatrial junction. There is cardiomegaly with mild vascular congestion. No focal consolidation, pleural effusion, or pneumothorax atherosclerotic calcification of the aorta. No acute osseous pathology. IMPRESSION: 1. Endotracheal tube above the carina. 2. Cardiomegaly with mild vascular congestion. No focal consolidation. Electronically Signed   By: Anner Crete M.D.   On: 05/24/2021 19:00   DG Abd Portable 1V  Result Date: 05/24/2021 CLINICAL DATA:  Small-bowel obstruction EXAM: PORTABLE ABDOMEN - 1 VIEW COMPARISON:  KUB obtained 1 day prior FINDINGS: The enteric catheter tip is in the stomach. The side hole is at the level of the GE junction. Recommend advancement by approximately 3-4 cm. Enteric contrast is again seen in the small bowel concentrated in the right hemiabdomen. The small bowel remains dilated measuring up to 4.4 cm in maximum diameter the dilated small bowel is similar in caliber to the prior study, though contrast may be present within the ascending colon. A biliary stent is again noted. A calcified gallstone is again seen. The bones are stable. IMPRESSION: 1. Similar degree of dilation of small bowel  in the right lower quadrant, but contrast may have passed into the ascending colon. Continued radiographic follow-up is recommended. 2. Enteric catheter side hole is at the level of the GE junction. Recommend advancement by 3-4 cm. These results will be called to the ordering clinician or representative by the Radiologist Assistant, and communication documented in the PACS or [employer]. Electronically Signed   By: Valetta Mole M.D.   On: 05/24/2021 15:18   ECHOCARDIOGRAM COMPLETE  Result Date: 05/23/2021    ECHOCARDIOGRAM REPORT   Patient Name:   Patrick [name] Date of Exam: 05/23/2021 Medical Rec #:  [id number]            Height:       71.0 in Accession #:    [id number]          Weight:       141.7 lb Date of Birth:  1956/01/29           BSA:          1.821 m Patient Age:    18 years            BP:           122/77 mmHg Patient Gender: M                   HR:           100 bpm. Exam Location:  Inpatient Procedure: 2D Echo, Cardiac Doppler and Color Doppler Indications:    CHF, PHTN  History:        Patient has prior history of Echocardiogram examinations, most                 recent 09/12/2019. CHF, Signs/Symptoms:Chest Pain; Risk                 Factors:Hypertension.  Sonographer:    Glo Herring Referring Phys: [phone] LINDSAY NICOLE [place]  1. Left ventricular ejection fraction, by estimation, is 40 to 45%. The left ventricle has mildly decreased function. The left ventricle demonstrates global hypokinesis. The left ventricular internal cavity size was moderately dilated. There is moderate  concentric left ventricular hypertrophy. Left ventricular diastolic parameters are indeterminate.  2. Right ventricular systolic function is mildly reduced. The right ventricular size is severely enlarged. There is moderately elevated pulmonary artery systolic pressure.  3. Left atrial size was severely dilated.  4. Right atrial size was severely dilated.  5. The mitral valve is grossly normal. Severe mitral valve regurgitation.  6. Mechanism appears to be secondary MR; there are multuiple jets that in summation suggestive severe regurgitation; only blunting of the pulmonary vein flow. Consider secondary imaging modality (TEE vs CMR) if procedual intervention is to be considered.  7. The tricuspid valve is abnormal. Tricuspid valve regurgitation is severe.  8. The aortic valve is tricuspid. There is mild calcification of the aortic valve. Aortic valve regurgitation is not visualized. Aortic valve sclerosis is present, with no evidence of aortic valve stenosis.  9. The inferior vena cava is dilated in size with <50% respiratory  variability, suggesting right atrial pressure of 15 mmHg. Comparison(s): A prior study was performed on 02/17/20. Prior images reviewed side by side. Similar LV size and dilation. FINDINGS  Left Ventricle: Left ventricular ejection fraction, by estimation, is 40 to 45%. The left ventricle has mildly decreased function. The left ventricle demonstrates global hypokinesis. The left ventricular internal cavity size was moderately dilated. There is moderate concentric left ventricular hypertrophy. Left ventricular  diastolic parameters are indeterminate. Right Ventricle: The right ventricular size is severely enlarged. No increase in right ventricular wall thickness. Right ventricular systolic function is mildly reduced. There is moderately elevated pulmonary artery systolic pressure. The tricuspid regurgitant velocity is 3.15 m/s, and with an assumed right atrial pressure of 15 mmHg, the estimated right ventricular systolic pressure is 51.0 mmHg. Left [facility]: Left atrial size was severely dilated. Right [facility]: Right atrial size was severely dilated. Pericardium: There is no evidence of pericardial effusion. Mitral Valve: There is a secondary MR with multiple jets from ventricular dilation (predominantley at the P2 position) multuple jets; only blunted PV. The mitral valve is grossly normal. Severe mitral valve regurgitation. Tricuspid Valve: The tricuspid valve is abnormal. Tricuspid valve regurgitation is severe. Aortic Valve: The aortic valve is tricuspid. There is mild calcification of the aortic valve. Aortic valve regurgitation is not visualized. Aortic valve sclerosis is present, with no evidence of aortic valve stenosis. Aortic valve mean gradient measures 3.3 mmHg. Aortic valve peak gradient measures 6.2 mmHg. Aortic valve area, by VTI measures 2.44 cm. Pulmonic Valve: The pulmonic valve was grossly normal. Pulmonic valve regurgitation is not visualized. No evidence of pulmonic stenosis. Aorta: The aortic root  and ascending aorta are structurally normal, with no evidence of dilitation. Venous: The inferior vena cava is dilated in size with less than 50% respiratory variability, suggesting right atrial pressure of 15 mmHg. IAS/Shunts: The atrial septum is grossly normal.  LEFT VENTRICLE PLAX 2D LVIDd:         6.20 cm      Diastology LVIDs:         4.60 cm      LV e' medial:    10.76 cm/s LV PW:         1.20 cm      LV E/e' medial:  9.8 LV IVS:        1.20 cm      LV e' lateral:   9.86 cm/s LVOT diam:     2.10 cm      LV E/e' lateral: 10.7 LV SV:         56 LV SV Index:   31 LVOT Area:     3.46 cm  LV Volumes (MOD) LV vol d, MOD A2C: 130.0 ml LV vol d, MOD A4C: 161.0 ml LV vol s, MOD A2C: 74.9 ml LV vol s, MOD A4C: 95.6 ml LV SV MOD A2C:     55.1 ml LV SV MOD A4C:     161.0 ml LV SV MOD BP:      60.0 ml RIGHT VENTRICLE             IVC RV Basal diam:  4.90 cm     IVC diam: 3.10 cm RV S prime:     13.43 cm/s LEFT ATRIUM              Index        RIGHT ATRIUM           Index LA diam:        6.10 cm  3.35 cm/m   RA Area:     30.70 cm LA Vol (A2C):   135.0 ml 74.12 ml/m  RA Volume:   105.00 ml 57.65 ml/m LA Vol (A4C):   141.0 ml 77.42 ml/m LA Biplane Vol: 145.0 ml 79.61 ml/m  AORTIC VALVE                    PULMONIC  VALVE AV Area (Vmax):    2.28 cm     PV Vmax:       1.41 m/s AV Area (Vmean):   2.06 cm     PV Peak grad:  8.0 mmHg AV Area (VTI):     2.44 cm AV Vmax:           125.00 cm/s AV Vmean:          87.933 cm/s AV VTI:            0.231 m AV Peak Grad:      6.2 mmHg AV Mean Grad:      3.3 mmHg LVOT Vmax:         82.20 cm/s LVOT Vmean:        52.267 cm/s LVOT VTI:          0.163 m LVOT/AV VTI ratio: 0.70  AORTA Ao Root diam: 3.70 cm Ao Asc diam:  3.40 cm MITRAL VALVE                  TRICUSPID VALVE MV Area (PHT): 4.78 cm       TR Peak grad:   39.7 mmHg MV Decel Time: 159 msec       TR Vmax:        315.00 cm/s MR Peak grad:    99.6 mmHg MR Mean grad:    68.0 mmHg    SHUNTS MR Vmax:         499.00 cm/s  Systemic  VTI:  0.16 m MR Vmean:        393.7 cm/s   Systemic Diam: 2.10 cm MR PISA:         2.26 cm MR PISA Eff ROA: 17 mm MR PISA Radius:  0.60 cm MV E velocity: 105.00 cm/s Rudean Haskell MD Electronically signed by Rudean Haskell MD Signature Date/Time: 05/23/2021/12:01:17 PM    Final    IR TUNNELED CENTRAL VENOUS CATHETER PLACEMENT  Result Date: 05/23/2021 INDICATION: 65 year old male with end-stage renal disease requiring central venous access for nutrition supplementation. EXAM: 1. Ultrasound-guided venipuncture of the jugular vein 2. Fluoroscopic guided placement of tunneled central venous catheter MEDICATIONS: None. ANESTHESIA/SEDATION: Local anesthesia only. FLUOROSCOPY TIME:  0 minutes 48 seconds (3 mGy). COMPLICATIONS: None immediate. PROCEDURE: Informed written consent was obtained from the patient after a discussion of the risks, benefits, and alternatives to treatment. Questions regarding the procedure were encouraged and answered. The right neck and chest were prepped with chlorhexidine in a sterile fashion, and a sterile drape was applied covering the operative field. Maximum barrier sterile technique with sterile gowns and gloves were used for the procedure. A timeout was performed prior to the initiation of the procedure. After creating a small venotomy incision, a 21 gauge micropuncture kit was utilized to access the internal jugular vein. Real-time ultrasound guidance was utilized for vascular access including the acquisition of a permanent ultrasound image documenting patency of the accessed vessel. A Mandril wire to the level of the cavoatrial junction. A 6 French tunneled central venous catheter measuring 25 cm from tip to cuff was tunneled in a retrograde fashion from the anterior chest wall to the venotomy incision. A peel-away sheath was placed over the wire. The catheter was then placed through the peel-away sheath with the catheter tip ultimately positioned at the cavoatrial  junction. Final catheter positioning was confirmed and documented with a spot radiographic image. The catheter aspirates and flushes normally. The catheter was flushed with appropriate volume heparin dwells.  The catheter exit site was secured with a 0-Prolene retention suture. The venotomy incision was closed with Dermabond. Sterile dressings were applied. The patient tolerated the procedure well without immediate post procedural complication. IMPRESSION: Successful placement of 25 cm tip to cuff tunneled 6 French central venous catheter via the right internal jugular vein with catheter tip terminating at the cavoatrial junction. The catheter is ready for immediate use. Ruthann Cancer, MD [facility] Radiology Electronically Signed   By: Ruthann Cancer M.D.   On: 05/23/2021 12:30    Anti-infectives: Anti-infectives (From admission, onward)    Start     Dose/Rate Route Frequency Ordered Stop   05/24/21 1730  piperacillin-tazobactam (ZOSYN) IVPB 2.25 g        2.25 g 100 mL/hr over 30 Minutes Intravenous Every 8 hours 05/24/21 1654     05/24/21 1615  metroNIDAZOLE (FLAGYL) IVPB 500 mg       Note to Pharmacy: Please tube to #75    500 mg 100 mL/hr over 60 Minutes Intravenous  Once 05/24/21 1603 05/24/21 1630        Assessment/Plan POD 1, s/p dx lap converted to ex lap for SBR and cholecystectomy for gangrenous cholecystitis and chronic ischemic segment of SB causing an obstruction, Dr. Donne Hazel 05/24/21 -ERCP at [facility] -wean to extubate as able per CCM, looks good right now -BID dressing changes -cont NGT for post op ileus -cont JP drain due to intra-op findings -mobilize as able, therapies already written for. -TNA for nutritional support while has ileus -cont zosyn, likely for 5 days   FEN: NPO, NGT to LIWS, TPN VTE: SCDs, DVT prophylaxis on hold til hgb stabilizes ID: [email or web address]   ESRD - TTS, per renal CHF - per cards A. Fib on eliquis, on  hold Anemia - hgb 6.9 this am, tx 1 unit.  Got 2/2 in OR yesterday (pRBC/FFP) Acute hypoxic respiratory failure - extubate per CCM   LOS: 6 days    Henreitta Cea, [facility] 05/25/2021, 7:49 AM Please see Amion for pager number during day hours 7:00am-4:30pm

## 2021-05-25 NOTE — Progress Notes (Addendum)
Advanced Heart Failure Rounding Note  PCP-Cardiologist: Dr. Haroldine Laws   Subjective:    S/p lap, cholecystectomy and SB resection 12/07. Post-op intubated on vasopressin  Extubated this am  Off pressors  Feeling okay. No dyspnea or CP. Getting PCA for pain control.  Echo 12/06: EF 40-45%, RV mildly reduced, RVSP 55 mmHg, severe MR, severe TR   Objective:   Weight Range: 65.1 kg Body mass index is 20.02 kg/m.   Vital Signs:   Temp:  [97.7 F (36.5 C)-98.6 F (37 C)] 97.9 F (36.6 C) (12/08 1050) Pulse Rate:  [46-226] 70 (12/08 1200) Resp:  [12-26] 13 (12/08 1200) BP: (101-129)/(53-70) 124/56 (12/08 1200) SpO2:  [61 %-100 %] 97 % (12/08 1200) Arterial Line BP: (113-144)/(41-60) 138/49 (12/08 1200) FiO2 (%):  [30 %-100 %] 30 % (12/08 0845) Weight:  [65.1 kg] 65.1 kg (12/08 0500) Last BM Date: 05/22/21  Weight change: Filed Weights   05/24/21 0600 05/24/21 1045 05/25/21 0500  Weight: 64.5 kg 64.5 kg 65.1 kg    Intake/Output:   Intake/Output Summary (Last 24 hours) at 05/25/2021 1232 Last data filed at 05/25/2021 1100 Gross per 24 hour  Intake 5140.99 ml  Output 325 ml  Net 4815.99 ml      Physical Exam    General: Chronically ill appearing, sitting up in bed HEENT: + NGT Neck: supple. + JVP to jaw. Carotids 2+ bilat; no bruits. No lymphadenopathy or thryomegaly appreciated. Cor: PMI nondisplaced. Regular rate & rhythm. No rubs, gallops or murmurs. Right subclavian CVC Lungs: clear Abdomen: soft, nontender, + distended. Dressing noted. Extremities: no cyanosis, clubbing, rash, edema, L radial aline Neuro: alert & orientedx3, cranial nerves grossly intact. moves all 4 extremities w/o difficulty. Affect pleasant     Telemetry   Afib, 70s  EKG    No new EKG to review   Labs    CBC Recent Labs    05/23/21 0131 05/24/21 0400 05/24/21 1541 05/24/21 1855 05/25/21 0609  WBC 9.0 8.8  --  5.6 11.3*  NEUTROABS 6.6 6.8  --   --   --   HGB 7.8*  7.5*   < > 7.9* 6.9*  HCT 22.5* 21.9*   < > 22.6* 19.0*  MCV 100.0 100.0  --  93.0 93.1  PLT 193 174  --  145* 113*   < > = values in this interval not displayed.   Basic Metabolic Panel Recent Labs    05/24/21 0400 05/24/21 1541 05/24/21 1855 05/25/21 0609  NA 138   < > 137 134*  K 3.9   < > 3.3* 3.6  CL 95*   < > 100 98  CO2 29  --  26 25  GLUCOSE 108*   < > 140* 210*  BUN 81*   < > 21 30*  CREATININE 7.06*   < > 2.89* 3.69*  CALCIUM 8.6*  --  7.7* 8.0*  MG 2.6*  --   --  1.9  PHOS 6.2*  --   --  4.1   < > = values in this interval not displayed.   Liver Function Tests Recent Labs    05/24/21 0400 05/25/21 0609  AST 35 32  ALT 18 16  ALKPHOS 163* 110  BILITOT 2.3* 1.5*  PROT 6.0* 4.8*  ALBUMIN 2.4* 1.9*   No results for input(s): LIPASE, AMYLASE in the last 72 hours. Cardiac Enzymes No results for input(s): CKTOTAL, CKMB, CKMBINDEX, TROPONINI in the last 72 hours.  BNP: BNP (last 3  results) Recent Labs    08/01/20 2047  BNP 2,730.5*    ProBNP (last 3 results) No results for input(s): PROBNP in the last 8760 hours.   D-Dimer No results for input(s): DDIMER in the last 72 hours. Hemoglobin A1C No results for input(s): HGBA1C in the last 72 hours. Fasting Lipid Panel Recent Labs    05/23/21 0131  TRIG 73   Thyroid Function Tests No results for input(s): TSH, T4TOTAL, T3FREE, THYROIDAB in the last 72 hours.  Invalid input(s): FREET3  Other results:   Imaging    DG Chest Port 1 View  Result Date: 05/25/2021 CLINICAL DATA:  Acute respiratory failure with hypoxia. EXAM: PORTABLE CHEST 1 VIEW COMPARISON:  05/24/2021. FINDINGS: The heart is enlarged and the pulmonary vasculature is distended. Atherosclerotic calcification of the aorta is noted. Mild airspace disease is present at the left lung base. No effusion or pneumothorax is seen. An enteric tube courses over the left upper quadrant and out of the field of view. The endotracheal tube  terminates 3.4 cm above the carina. A right internal jugular central venous catheter is stable in position. IMPRESSION: 1. Cardiomegaly with pulmonary vascular congestion. 2. Minimal airspace disease at the left lung base, possible atelectasis, edema, or infiltrate. 3. Medical devices as described above. Electronically Signed   By: Brett Fairy M.D.   On: 05/25/2021 04:21   DG Chest Port 1 View  Result Date: 05/24/2021 CLINICAL DATA:  Status post intubation. EXAM: PORTABLE CHEST 1 VIEW COMPARISON:  Chest radiograph dated 08/01/2020. FINDINGS: Endotracheal tube with tip approximately 6 cm above the carina. Enteric tube extends below the diaphragm with tip in the proximal stomach. Right IJ central venous line with tip at the cavoatrial junction. There is cardiomegaly with mild vascular congestion. No focal consolidation, pleural effusion, or pneumothorax atherosclerotic calcification of the aorta. No acute osseous pathology. IMPRESSION: 1. Endotracheal tube above the carina. 2. Cardiomegaly with mild vascular congestion. No focal consolidation. Electronically Signed   By: Anner Crete M.D.   On: 05/24/2021 19:00   DG Abd Portable 1V  Result Date: 05/24/2021 CLINICAL DATA:  Small-bowel obstruction EXAM: PORTABLE ABDOMEN - 1 VIEW COMPARISON:  KUB obtained 1 day prior FINDINGS: The enteric catheter tip is in the stomach. The side hole is at the level of the GE junction. Recommend advancement by approximately 3-4 cm. Enteric contrast is again seen in the small bowel concentrated in the right hemiabdomen. The small bowel remains dilated measuring up to 4.4 cm in maximum diameter the dilated small bowel is similar in caliber to the prior study, though contrast may be present within the ascending colon. A biliary stent is again noted. A calcified gallstone is again seen. The bones are stable. IMPRESSION: 1. Similar degree of dilation of small bowel in the right lower quadrant, but contrast may have passed into  the ascending colon. Continued radiographic follow-up is recommended. 2. Enteric catheter side hole is at the level of the GE junction. Recommend advancement by 3-4 cm. These results will be called to the ordering clinician or representative by the Radiologist Assistant, and communication documented in the PACS or [employer]. Electronically Signed   By: Valetta Mole M.D.   On: 05/24/2021 15:18     Medications:     Scheduled Medications:  arformoterol  15 mcg Nebulization BID   budesonide (PULMICORT) nebulizer solution  0.25 mg Nebulization BID   Chlorhexidine Gluconate Cloth  6 each Topical Daily   [START ON 05/27/2021] darbepoetin (ARANESP) injection -  DIALYSIS  200 mcg Intravenous Q Sat-HD   HYDROmorphone   Intravenous Q4H   insulin aspart  0-15 Units Subcutaneous Q4H   mouth rinse  15 mL Mouth Rinse BID   pantoprazole (PROTONIX) IV  40 mg Intravenous Q12H   revefenacin  175 mcg Nebulization Daily    Infusions:  amiodarone 30 mg/hr (05/25/21 1100)   methocarbamol (ROBAXIN) IV Stopped (05/25/21 0951)   piperacillin-tazobactam (ZOSYN)  IV 2.25 g (05/25/21 1102)   potassium chloride 10 mEq (05/25/21 1224)   TPN ADULT (ION)      PRN Medications: albuterol, ondansetron (ZOFRAN) IV    Patient Profile   65 y/o chronically ill male with sickle cell dz, ESRD, chronic systolic HF EF 27%, pulmonary HTN, chronic AF and legal blindness. Admitted with recurrent SBO. We are asked to help manage AF with RVR and assess peri-operative risk.   Assessment/Plan    1. Persistent Atrial Fibrillation/AFL - S/p DCCV 05/2018 - S/P DCCV 10/06/19 NSR - Recurrence 05/21. Has been seen in AF Clinic and rate control strategy recommended - Rate controlled with amio gtt at 30/hr - Holding anticoagulation d/t a/c anemia. Concern about risks of recurrent bleeding with anticoagulation. See below   2. Chronic combined Systolic/Diastolic Heart Failure:  - NICM. Cath 05/2011 without significant  coronary disease.   - ECHO 12/2015 40%.  - Echo 05/2018: EF 25-30%.  - Echo 3/21 LVEF 35-40%, low normal RV function,severe pulm HTN. Severe LAE, mod RAE, mod MR, severe TR. RV TAPSE 1.6, RV s' 10 - Echo 9/21 EF 40-45%, RV mildly reduced, mild RAE, severe pulm artery systolic pressure, severe TR - Echo this admit suggestive of restrictive CM with EF 40-45%, RV mildly decreased, severe MR, severe TR, RVSP 55 mmHg - NYHA III at baseline.  Appears volume up. JVP to jaw in setting of severe TR. Volume managed with HD on T,T,S, going for HD tomorrow - BP elevated now. Add hydralazine. - Off Entresto with ESRD   3. Anemia with H/o GI bleeding: - Prior EGD in 2019 showed nonbleeding gastritis, Colonoscopy showed small internal hemorrhoids, otherwise normal.  - GI bleed 02/22 - gastric erosions on EGD - A/C anemia 11/22 - transfused with 3 uPRBCs - A/C anemia, received 5 u PRBcs so far this admit, 2 u FFP - Hgb 8.7 this afternoon - Baseline hgb 8-9 - Received aranesp - Holding anticoagulation. May need to weigh risks/benefits of resuming anticoagulation with hx recurrent bleeding.    4. SBO/gangrenous cholecystitis - s/p lap, cholecystectomy and SB resection 12/07 - Has open abdominal wound - on zosyn   6. Pulmonary HTN  - Last echo 09/21 with EF 40-45%, RV mildly reduced, RVSP 75 mmHg, severe TR - Repeat echo this admit with EF 40-45%, RV mildly reduced, severe MR, severe TR, RVSP 55 mmHg   7. Abdominal ascites -Noted on CT -Mild hepatic nodularity present   8. HTN: - BP had been soft, now elevated this afternoon - Add hydralazine   9.  Sickle Cell:   - Follows closely with [facility].    7. Former smoker:   - Quit 11/2015.   11. ESRD - On HD now T/T/S - Nephrology following     Length of Stay: Backus, LINDSAY N, PA-C  05/25/2021, 12:32 PM  Advanced Heart Failure Team Pager [phone] (M-F; 7a - 5p)  Please contact [facility] for night-coverage after hours  (5p -7a ) and weekends on [email or web address]  Patient seen and examined with the  above-signed Engineer, building services. I personally reviewed laboratory data, imaging studies and relevant notes. I independently examined the patient and formulated the important aspects of the plan. I have edited the note to reflect any of my changes or salient points. I have personally discussed the plan with the patient and/or family.  Extubated. Sitting up in chair. No flatus yet. Rate controlled on IV amio. BP elevated  General:  Sitting in chair No resp difficulty HEENT: normal + NGT Neck: supple. JVP 6-7  Carotids 2+ bilat; no bruits. No lymphadenopathy or thryomegaly appreciated. Cor: PMI nondisplaced. Irregular rate & rhythm. 2/6 SEM. Lungs: clear Abdomen: soft, nontender, + distended. Hypoactive bowel sounds. Extremities: no cyanosis, clubbing, rash, edema Neuro: alert & orientedx3, cranial nerves grossly intact. moves all 4 extremities w/o difficulty. Affect pleasant  Tolerated surgery well from cardiac standpoint. Remains in permanent AF but rate controlled on amio. Can wean amio off. If rate increases can restart. Remains off AC for now. Volume status managed with HD.   Glori Bickers, MD  6:18 PM

## 2021-05-25 NOTE — Consult Note (Signed)
NAME:  Patrick [name], MRN:  [id number], DOB:  1955/08/28, LOS: 33 ADMISSION DATE:  05/19/2021, CONSULTATION DATE:  12/7 REFERRING MD:  Dr. Bonner Puna, CHIEF COMPLAINT:  SBO  History of Present Illness:  Patient is a 65 year old male with pertinent PMH ESRD (dialysis Tuesday, Thursday, Saturday), chronic systolic CHF, COPD, pulm hypertension, sickle cell anemia, chronic A. fib on Eliquis, SBO, choledocholithiasis presents to [facility] on 12/2 with recurrent SBO.  On 05/09/2021, patient was hospitalized at [place] with SBO and choledocholithiasis.  SBO resolved with conservative measures and choledocholithiasis improved with ERCP and stent placement.  Patient's abdominal pain continue to worsen and was admitted to [facility] on 12/2 which showed recurrent SBO and choledocholithiasis.  Patient hemoglobin low and stopped home Eliquis.  NG tube placed and made NPO.  On 12/7 patient received surgery for SBO and choledocholithiasis.  Patient received ex lap, surgical resection of SBO and cholecystectomy.  Patient remained on vaso and remained intubated postprocedure.  PCCM consulted for ICU admission and vent/medical management.  Pertinent  Medical History   Past Medical History:  Diagnosis Date   Blood dyscrasia    Blood transfusion    "I've had a bunch of them"   CHF (congestive heart failure) ([place])    followed by hf clinic   Chronic kidney disease (CKD), stage III (moderate) ([place])    Dialysis patient ([place]) 04/2019   AV fistula (right arm)   Gout    Headache(784.0)    "probably weekly" (01/13/2014)   Legally blind    Shortness of breath dyspnea    Sickle cell disease, type SS (HCC)    Swelling abdomen    Swelling of extremity, left    Swelling of extremity, right      Significant Hospital Events: Including procedures, antibiotic start and stop dates in addition to other pertinent events   12/2: admitted to [facility] for bowel obstruction 12/7: surgery bowel resection and drain placement;  arrived to ICU intubated post procedure  Interim History / Subjective:  Patient remains intubated on PSV 5/5 doing well Patient awake and following commands; off sedation Mild abdominal pain Brady 50s Remains on amio drip and vaso 0.03   Objective   Blood pressure (!) 109/59, pulse (!) 54, temperature 97.8 F (36.6 C), temperature source Oral, resp. rate 19, height 5\' 11"  (1.803 m), weight 65.1 kg, SpO2 100 %.    Vent Mode: PRVC FiO2 (%):  [30 %-100 %] 30 % Set Rate:  [15 bmp] 15 bmp Vt Set:  [600 mL] 600 mL PEEP:  [5 cmH20] 5 cmH20 Plateau Pressure:  [14 cmH20-20 cmH20] 17 cmH20   Intake/Output Summary (Last 24 hours) at 05/25/2021 0906 Last data filed at 05/25/2021 4944 Gross per 24 hour  Intake 4440 ml  Output 325 ml  Net 4115 ml    Filed Weights   05/24/21 0600 05/24/21 1045 05/25/21 0500  Weight: 64.5 kg 64.5 kg 65.1 kg    Examination: General:  ill appearing on mech vent HEENT: MM pink/moist; ETT in place Neuro: Aox3; MAE CV: s1s2, brady 50s, no m/r/g; on vaso with map 70 PULM:  dim clear BS bilaterally; on mech vent PSV 5/5 40% GI: soft, bsx4 active Extremities: warm/dry, no edema  Skin: no rashes or lesions   Labs: K 3.6 Mag 1.9 Glucose range 170-210 WBC 11.3 (from 5.6) Hgb 6.9    Resolved Hospital Problem list     Assessment & Plan:  Acute respiratory failure requiring mechanical ventilation post surgery COPD: takes incruse  and ellipta at home P: -will extubate today -supplemental O2 for sats >92% -Pulm toiletry: IS and flutter -PT/OT -OOB as tolerated -continue yupelri, brovana, pulmicort; consider switching back to incruse and ellipta tomorrow -prn albuterol for wheezing  Chronic ischemic SBO: s/p ex lap and surgical resection 12/7 Gangrenous cholecystitis: 12/7 cholecystectomy; recent Choledocholithiasis s/p recent ERCP stent placement at [facility] Hyperbilirubinemia P: -per surgery; appreciate recs -prn tylenol and robaxin for  pain -abdominal dressing in place -continue to monitor JP drain -continue NPO and NG -continue zosyn -continue TPN -trend wbc/fever curve -trend CMP and bilirubin -follow surgical pathology from 12/7  Hypotension post surgery vs septic shock P: -hgb 6.9; transfusing PRBCs -repeat h/h later today -currently on vaso; wean for MAP >65 -extubating today and off precedex should improve BP -if unable to wean off vaso will add levo  ESRD: on HD tues, thurs, sat Hyponatremia Hyperphophatemia P: -nephrology following -plan for possible HD today -Trend BMP/UOP -Replace electrolytes as indicated -Avoid nephrotoxic agents, ensure adequate renal perfusion  ABLA on chronic Anemia: sickle cell and CKD P: -hgb 6.9 this am; receiving PRBCs -trend CBC -transfuse for hgb <7 -continue aranesp  Chronic systolic CHF: echo 8251 EF of 40 to 45% with mild right ventricular dysfunction and severe biatrial enlargement and severe mitral regurg and tricuspid regurg Afib RVR: on eliquis at home Pulm HTN P: -HF following -eliquis on hold due to recent surgery and hgb drop -continue amio -continue HD and torsemide -hold antihypertensives while hypotensive -daily weights -strict I/Os  Sickle cell disease P: -will need prn pain control when off sedation -takes hydroxyurea at home; on hold while NPO  Protein calorie malnutrition P: -TPN   Best Practice (right click and "Reselect all SmartList Selections" daily)   Diet/type: NPO DVT prophylaxis: SCD GI prophylaxis: PPI Lines: Arterial Line; consider removing when off pressors Foley:  N/A Code Status:  full code Last date of multidisciplinary goals of care discussion [12/8 spoke with first cousin Caren Griffins over phone who is POA and updated on plan of care]   Critical care time: 35 minutes    JD Rexene Agent [facility] 05/25/2021, 9:06 AM  Please see [email or web address] for pager details.  From 7A-7P if no response,  please call [phone]. After hours, please call ELink [phone].

## 2021-05-25 NOTE — Progress Notes (Signed)
Nutrition Follow-up  DOCUMENTATION CODES:   Severe malnutrition in context of acute illness/injury  INTERVENTION:   - Continue TPN to meet 100% of estimated needs  NUTRITION DIAGNOSIS:   Severe Malnutrition related to acute illness (SBO) as evidenced by moderate fat depletion, moderate muscle depletion, percent weight loss (8% weight loss within 3 months).  Ongoing, being addressed via TPN  GOAL:   Patient will meet greater than or equal to 90% of their needs  Met via TPN  MONITOR:   Labs, I & O's, Diet advancement  REASON FOR ASSESSMENT:   Consult New TPN/TNA  ASSESSMENT:   65 yo male admitted with SBO. PMH includes ESRD on HD, recent SBO (hospitalized at [facility] in [place] 1/22-11/26), CHF, global hypokinesis, mitral regurgitation, legally blind, sickle cell disease.  12/06 - TPN start 12/07 - s/p diagnostic laparoscopy, SBR with primary anastomosis, open cholecystectomy 12/08 - extubated  Discussed pt with RN and during ICU rounds. Pt extubated this morning. NG tube remains in place to low intermittent suction. Discussed pt with Pharmacy. Plan to increase TPN to goal rate of 65 ml/hr today. This will provide 1912 kcal and 101 grams of protein.  Admit weight: 67.9 kg Current weight: 65.1 kg  Medications reviewed and include: aranesp, SSI q 4 hours, IV protonix, amiodarone drip, IV abx, TPN, IV KCl 10 mEq x 1 run  Labs reviewed: sodium 134, hemoglobin 6.9, WBC 11.3, platelets 113 CBG's: 103-209 x 24 hours  L abd JP drain: 25 ml x 12 hours I/O's: +3.5 L since admit  Diet Order:   Diet Order             Diet NPO time specified  Diet effective now                   EDUCATION NEEDS:   Not appropriate for education at this time  Skin:  Skin Assessment: Skin Integrity Issues: Incisions: abdomen  Last BM:  05/22/21  Height:   Ht Readings from Last 1 Encounters:  05/19/21 5' 11"  (1.803 m)    Weight:   Wt Readings from Last 1  Encounters:  05/25/21 65.1 kg    BMI:  Body mass index is 20.02 kg/m.  Estimated Nutritional Needs:   Kcal:  1900-2100  Protein:  100-115 gm  Fluid:  1.9 L    Gustavus Bryant, MS, RD, LDN Inpatient Clinical Dietitian Please see AMiON for contact information.

## 2021-05-25 NOTE — Progress Notes (Signed)
Subjective:  S/p lap, cholecystectomy, and sb resection in OR yesterday In MICU, intubated but to extubate soon On vasopressin  Objective Vital signs in last 24 hours: Vitals:   05/25/21 0900 05/25/21 0912 05/25/21 1000 05/25/21 1011  BP: 113/61  118/61   Pulse: (!) 59 (!) 57 (!) 53   Resp: (!) 23 19 12    Temp:  97.8 F (36.6 C)    TempSrc:  Oral    SpO2: 100% 100% 100% (!) 82%  Weight:      Height:       Weight change: 0 kg  Physical Exam: General: intubated, awak;thin chronically ill male Heart: RRR no MRG noted Lungs: coarse BS b/l Abdomen: incisions bandaged Extremities: No pedal edema  dialysis Access: RUA aVF+ bruit  Dialysis Orders: TTS at AF 4hr, 400/500, EDW 65kg, 2K/2Ca, UFP #2, AVF, no heparin - Sensipar 32m PO q HD - Venofer 567mIV weekly - Mircera 22566mIV q 2 weeks (last 11/8)   Problem/Plan;    SBO, recent gallstone with CBD/pancreatic duct stenting: s/p ex lap, sb resection. Cholecystectomy 05/24/21; on TPN today,  Post op intubation, on vasopressin, for extubation today per CCM  ESRD:  Off schedule. Last HD 12/7, no indication for today, plan for tomorrow 12/9 Hypertension/volume: BP l lowish but improving, no Vol. excess on exam.  1-2L UF with HD as bp permits  Anemia of ESRD and history of sickle cell= transfusion as needed; Aranesp missed with HD 12/03 -> given subcu 12/04 200 MCG.  We will continue q. Saturday HD  Metabolic bone disease: Ca/Phos ok - resume binders when cleared to eat.  Nutrition: Has NG T, will order supplements when cleared to eat.  A-fib: On amnio drip, holding Eliquis D/t anemia risk of bleeding, per admit /cardiology      RyaRexene AgentD  [facility] 05/25/2021,10:50 AM  LOS: 6 days   Labs: Basic Metabolic Panel: Recent Labs  Lab 05/23/21 0131 05/24/21 0400 05/24/21 1541 05/24/21 1756 05/24/21 1855 05/25/21 0609  NA 136 138 138 139 137 134*  K 3.9 3.9 3.5 3.4* 3.3* 3.6  CL 95* 95* 97*  --   100 98  CO2 27 29  --   --  26 25  GLUCOSE 160* 108* 129*  --  140* 210*  BUN 75* 81* 21  --  21 30*  CREATININE 5.89* 7.06* 2.70*  --  2.89* 3.69*  CALCIUM 8.6* 8.6*  --   --  7.7* 8.0*  PHOS 5.3* 6.2*  --   --   --  4.1    Liver Function Tests: Recent Labs  Lab 05/23/21 0131 05/24/21 0400 05/25/21 0609  AST 33 35 32  ALT 16 18 16   ALKPHOS 168* 163* 110  BILITOT 2.0* 2.3* 1.5*  PROT 5.9* 6.0* 4.8*  ALBUMIN 2.4* 2.4* 1.9*    Recent Labs  Lab 05/19/21 1445  LIPASE 51    No results for input(s): AMMONIA in the last 168 hours. CBC: Recent Labs  Lab 05/22/21 0252 05/23/21 0131 05/24/21 0400 05/24/21 1541 05/24/21 1756 05/24/21 1855 05/25/21 0609  WBC 7.2 9.0 8.8  --   --  5.6 11.3*  NEUTROABS 6.0 6.6 6.8  --   --   --   --   HGB 7.6* 7.8* 7.5*   < > 7.8* 7.9* 6.9*  HCT 21.0* 22.5* 21.9*   < > 23.0* 22.6* 19.0*  MCV 96.8 100.0 100.0  --   --  93.0 93.1  PLT 176 193 174  --   --  145* 113*   < > = values in this interval not displayed.    Cardiac Enzymes: No results for input(s): CKTOTAL, CKMB, CKMBINDEX, TROPONINI in the last 168 hours. CBG: Recent Labs  Lab 05/24/21 1733 05/24/21 2002 05/24/21 2352 05/25/21 0536 05/25/21 0843  GLUCAP 128* 170* 209* 207* 189*      Medications:  acetaminophen     amiodarone 30 mg/hr (05/25/21 1041)   methocarbamol (ROBAXIN) IV Stopped (05/25/21 0951)   piperacillin-tazobactam (ZOSYN)  IV Stopped (05/25/21 0242)   TPN ADULT (ION) 50 mL/hr at 05/25/21 1041   vasopressin Stopped (05/25/21 1015)    arformoterol  15 mcg Nebulization BID   budesonide (PULMICORT) nebulizer solution  0.25 mg Nebulization BID   chlorhexidine gluconate (MEDLINE KIT)  15 mL Mouth Rinse BID   Chlorhexidine Gluconate Cloth  6 each Topical Daily   [START ON 05/27/2021] darbepoetin (ARANESP) injection - DIALYSIS  200 mcg Intravenous Q Sat-HD   insulin aspart  0-6 Units Subcutaneous Q6H   mouth rinse  15 mL Mouth Rinse 10 times per day    pantoprazole (PROTONIX) IV  40 mg Intravenous Q12H   revefenacin  175 mcg Nebulization Daily

## 2021-05-26 DIAGNOSIS — K56609 Unspecified intestinal obstruction, unspecified as to partial versus complete obstruction: Secondary | ICD-10-CM | POA: Diagnosis not present

## 2021-05-26 DIAGNOSIS — N186 End stage renal disease: Secondary | ICD-10-CM | POA: Diagnosis not present

## 2021-05-26 DIAGNOSIS — I5022 Chronic systolic (congestive) heart failure: Secondary | ICD-10-CM | POA: Diagnosis not present

## 2021-05-26 DIAGNOSIS — R1084 Generalized abdominal pain: Secondary | ICD-10-CM | POA: Diagnosis not present

## 2021-05-26 LAB — TYPE AND SCREEN
ABO/RH(D): AB POS
Antibody Screen: NEGATIVE
Unit division: 0
Unit division: 0
Unit division: 0

## 2021-05-26 LAB — CBC
HCT: 23 % — ABNORMAL LOW (ref 39.0–52.0)
Hemoglobin: 8.1 g/dL — ABNORMAL LOW (ref 13.0–17.0)
MCH: 32.7 pg (ref 26.0–34.0)
MCHC: 35.2 g/dL (ref 30.0–36.0)
MCV: 92.7 fL (ref 80.0–100.0)
Platelets: 122 10*3/uL — ABNORMAL LOW (ref 150–400)
RBC: 2.48 MIL/uL — ABNORMAL LOW (ref 4.22–5.81)
RDW: 24.2 % — ABNORMAL HIGH (ref 11.5–15.5)
WBC: 14.5 10*3/uL — ABNORMAL HIGH (ref 4.0–10.5)
nRBC: 2.8 % — ABNORMAL HIGH (ref 0.0–0.2)

## 2021-05-26 LAB — BASIC METABOLIC PANEL
Anion gap: 14 (ref 5–15)
BUN: 47 mg/dL — ABNORMAL HIGH (ref 8–23)
CO2: 24 mmol/L (ref 22–32)
Calcium: 8.3 mg/dL — ABNORMAL LOW (ref 8.9–10.3)
Chloride: 96 mmol/L — ABNORMAL LOW (ref 98–111)
Creatinine, Ser: 4.71 mg/dL — ABNORMAL HIGH (ref 0.61–1.24)
GFR, Estimated: 13 mL/min — ABNORMAL LOW (ref >60)
Glucose, Bld: 102 mg/dL — ABNORMAL HIGH (ref 70–99)
Potassium: 3.3 mmol/L — ABNORMAL LOW (ref 3.5–5.1)
Sodium: 134 mmol/L — ABNORMAL LOW (ref 135–145)

## 2021-05-26 LAB — BPAM RBC
Blood Product Expiration Date: 202212202359
Blood Product Expiration Date: 202212252359
Blood Product Expiration Date: 202212272359
ISSUE DATE / TIME: 202212071406
ISSUE DATE / TIME: 202212071406
ISSUE DATE / TIME: 202212080831
Unit Type and Rh: 1700
Unit Type and Rh: 1700
Unit Type and Rh: 9500

## 2021-05-26 LAB — GLUCOSE, CAPILLARY
Glucose-Capillary: 116 mg/dL — ABNORMAL HIGH (ref 70–99)
Glucose-Capillary: 131 mg/dL — ABNORMAL HIGH (ref 70–99)
Glucose-Capillary: 95 mg/dL (ref 70–99)
Glucose-Capillary: 114 mg/dL — ABNORMAL HIGH (ref 70–99)

## 2021-05-26 LAB — HEMOGLOBIN A1C
Hgb A1c MFr Bld: 4.9 % (ref 4.8–5.6)
Mean Plasma Glucose: 94 mg/dL

## 2021-05-26 LAB — PHOSPHORUS: Phosphorus: 4.1 mg/dL (ref 2.5–4.6)

## 2021-05-26 LAB — MAGNESIUM: Magnesium: 2.1 mg/dL (ref 1.7–2.4)

## 2021-05-26 MED ORDER — POTASSIUM CHLORIDE 10 MEQ/50ML IV SOLN
10.0000 meq | INTRAVENOUS | Status: AC
  Administered 2021-05-26 (×2): 10 meq via INTRAVENOUS
  Filled 2021-05-26 (×2): qty 50

## 2021-05-26 MED ORDER — SODIUM CHLORIDE 0.9% FLUSH: 10.0000 mL | INTRAVENOUS | Status: DC | PRN

## 2021-05-26 MED ORDER — INSULIN ASPART 100 UNIT/ML IJ SOLN: 0.0000 [IU] | Freq: Three times a day (TID) | INTRAMUSCULAR | Status: DC

## 2021-05-26 MED ORDER — TRACE MINERALS CU-MN-SE-ZN 300-55-60-3000 MCG/ML IV SOLN
INTRAVENOUS | Status: AC
  Filled 2021-05-26: qty 676

## 2021-05-26 MED ORDER — SODIUM CHLORIDE 0.9% FLUSH
10.0000 mL | Freq: Two times a day (BID) | INTRAVENOUS | Status: DC
  Administered 2021-05-26: 21:00:00 10 mL
  Administered 2021-05-26: 12:00:00 20 mL
  Administered 2021-05-26 – 2021-06-09 (×25): 10 mL

## 2021-05-26 NOTE — Progress Notes (Signed)
PHARMACY - TOTAL PARENTERAL NUTRITION CONSULT NOTE  Indication: Small bowel obstruction  Patient Measurements: Height: 5\' 11"  (180.3 cm) Weight: 68.5 kg (151 lb 0.2 oz) IBW/kg (Calculated) : 75.3 TPN AdjBW (KG): 67.6 Body mass index is 21.06 kg/m. Usual Weight: 154 lb 02/28/21  Assessment:  58 yom with ESRD admitted with worsening abdominal pain, found to have high-grade SBO. He had recent admission at [facility] for SBO resolved with conservative management; also had choledocholithiasis s/p ERCP with stone extraction, stent to CBD and PD. Lap chole recommended but declined. Surgery reports no flatus or BM except with enema on 12/5 and unlikely to resolve without surgical intervention. Given prolonged NPO status, pharmacy consulted for TPN for nutritional support. At risk for refeeding syndrome.  Glucose / Insulin: no hx DM, CBGs elevated post-op 12/8, now < 180 Utilized 9 unit SSI Electrolytes: low Na/CL, K down to 3.3 post 1 run (goal >/= 4 for Afib/ileus), others WNL Renal: ESRD on HD TTS - session pushed to 12/7 AM Hepatic: LFTs / TG WNL, tbili down to 1.5 (no evidence of jaundice per RN 12/7), albumin 1.9 Intake / Output; MIVF: oliguric, NG 284mL, drain 68mL, net +4.2L, LBM 12/1 GI Imaging:  12/3 Abd XR: Probable persistent SBO not well evaluated 12/4 CT A/P: diffusely dilated SB with decompressed terminal ileum c/w SBO 12/6 Abd XR: dilation of small-bowel loops suggesting ileus or pSBO 12/7 Abd XR: unchanged GI Surgeries / Procedures:  12/7 - cholecystectomy with SBR  Central access: IJ placed by IR 05/23/21 TPN start date: 05/23/21  Nutritional Goals: RD Estimated Needs Total Energy Estimated Needs: 1900-2100 Total Protein Estimated Needs: 100-115 gm Total Fluid Estimated Needs: 1.9 L  Current Nutrition:  TPN  Plan:  Continue concentrated TPN at goal rate of 65 mL/hr to provide 101g AA, 273g CHO and 58g ILE for a total of 1912 kCal, meeting 100% of needs Electrolytes  in TPN: increase Na to 118mEq/L, add K 90mEq/L (= 15 mEq/d) per discussion with Renal, Ca 46mEq/L, Mag 60mEq/L added 12/5, Phos 30mmol/L since 12/6, max CL Add standard MVI and trace elements to TPN; remove chromium while on RRT Reduce SSI to sensitive Q8H - consider stopping if CBGs remain controlled KCL x 2 run per discussion with Renal F/U AM labs to see lytes changes post HD 12/9 (Renal using high K baths)  Summar Mcglothlin D. [name], PharmD, BCPS, [place] 05/26/2021, 10:21 AM

## 2021-05-26 NOTE — Progress Notes (Signed)
Progress Note  2 Days Post-Op  Subjective: Looks great this morning.  Pain controlled with PCA.  No flatus or BM.  Objective: Vital signs in last 24 hours: Temp:  [97.8 F (36.6 C)-98.4 F (36.9 C)] 98 F (36.7 C) (12/09 0400) Pulse Rate:  [52-226] 85 (12/09 0700) Resp:  [11-23] 12 (12/09 0700) BP: (109-163)/(56-81) 147/69 (12/09 0700) SpO2:  [61 %-100 %] 98 % (12/09 0700) Arterial Line BP: (113-347)/(42-161) 157/59 (12/08 1530) FiO2 (%):  [0 %-30 %] 21 % (12/09 0410) Weight:  [68.5 kg] 68.5 kg (12/09 0500) Last BM Date: 05/22/21  Intake/Output from previous day: 12/08 0701 - 12/09 0700 In: 3016 [I.V.:1097.5; Blood:335; IV Piggyback:343.6] Out: 400 [Urine:150; Emesis/NG output:200; Drains:50] Intake/Output this shift: No intake/output data recorded.  PE: General: NAD, Abd: soft, appropriately tender, NGT in place with minimal bilious output, midline incision is clean and packed.  JP drain with serous drainage   Lab Results:  Recent Labs    05/24/21 1855 05/25/21 0609 05/25/21 1335  WBC 5.6 11.3*  --   HGB 7.9* 6.9* 8.7*  HCT 22.6* 19.0* 24.4*  PLT 145* 113*  --    BMET Recent Labs    05/25/21 0609 05/26/21 0508  NA 134* 134*  K 3.6 3.3*  CL 98 96*  CO2 25 24  GLUCOSE 210* 102*  BUN 30* 47*  CREATININE 3.69* 4.71*  CALCIUM 8.0* 8.3*   PT/INR Recent Labs    05/24/21 1855  LABPROT 15.6*  INR 1.2   CMP     Component Value Date/Time   NA 134 (L) 05/26/2021 0508   NA 135 05/24/2020 1119   K 3.3 (L) 05/26/2021 0508   CL 96 (L) 05/26/2021 0508   CO2 24 05/26/2021 0508   GLUCOSE 102 (H) 05/26/2021 0508   BUN 47 (H) 05/26/2021 0508   BUN 25 05/24/2020 1119   CREATININE 4.71 (H) 05/26/2021 0508   CREATININE 2.90 (H) 08/11/2015 1359   CALCIUM 8.3 (L) 05/26/2021 0508   PROT 4.8 (L) 05/25/2021 0609   PROT 7.5 05/24/2020 1119   ALBUMIN 1.9 (L) 05/25/2021 0609   ALBUMIN 4.1 05/24/2020 1119   AST 32 05/25/2021 0609   ALT 16 05/25/2021 0609    ALKPHOS 110 05/25/2021 0609   BILITOT 1.5 (H) 05/25/2021 0609   BILITOT 2.4 (H) 05/24/2020 1119   GFRNONAA 13 (L) 05/26/2021 0508   GFRNONAA 23 (L) 08/11/2015 1359   GFRAA 24 (L) 05/24/2020 1119   GFRAA 27 (L) 08/11/2015 1359   Lipase     Component Value Date/Time   LIPASE 51 05/19/2021 1445       Studies/Results: DG Chest Port 1 View  Result Date: 05/25/2021 CLINICAL DATA:  Acute respiratory failure with hypoxia. EXAM: PORTABLE CHEST 1 VIEW COMPARISON:  05/24/2021. FINDINGS: The heart is enlarged and the pulmonary vasculature is distended. Atherosclerotic calcification of the aorta is noted. Mild airspace disease is present at the left lung base. No effusion or pneumothorax is seen. An enteric tube courses over the left upper quadrant and out of the field of view. The endotracheal tube terminates 3.4 cm above the carina. A right internal jugular central venous catheter is stable in position. IMPRESSION: 1. Cardiomegaly with pulmonary vascular congestion. 2. Minimal airspace disease at the left lung base, possible atelectasis, edema, or infiltrate. 3. Medical devices as described above. Electronically Signed   By: Brett Fairy M.D.   On: 05/25/2021 04:21   DG Chest Port 1 View  Result Date: 05/24/2021 CLINICAL  DATA:  Status post intubation. EXAM: PORTABLE CHEST 1 VIEW COMPARISON:  Chest radiograph dated 08/01/2020. FINDINGS: Endotracheal tube with tip approximately 6 cm above the carina. Enteric tube extends below the diaphragm with tip in the proximal stomach. Right IJ central venous line with tip at the cavoatrial junction. There is cardiomegaly with mild vascular congestion. No focal consolidation, pleural effusion, or pneumothorax atherosclerotic calcification of the aorta. No acute osseous pathology. IMPRESSION: 1. Endotracheal tube above the carina. 2. Cardiomegaly with mild vascular congestion. No focal consolidation. Electronically Signed   By: Anner Crete M.D.   On: 05/24/2021  19:00   DG Abd Portable 1V  Result Date: 05/24/2021 CLINICAL DATA:  Small-bowel obstruction EXAM: PORTABLE ABDOMEN - 1 VIEW COMPARISON:  KUB obtained 1 day prior FINDINGS: The enteric catheter tip is in the stomach. The side hole is at the level of the GE junction. Recommend advancement by approximately 3-4 cm. Enteric contrast is again seen in the small bowel concentrated in the right hemiabdomen. The small bowel remains dilated measuring up to 4.4 cm in maximum diameter the dilated small bowel is similar in caliber to the prior study, though contrast may be present within the ascending colon. A biliary stent is again noted. A calcified gallstone is again seen. The bones are stable. IMPRESSION: 1. Similar degree of dilation of small bowel in the right lower quadrant, but contrast may have passed into the ascending colon. Continued radiographic follow-up is recommended. 2. Enteric catheter side hole is at the level of the GE junction. Recommend advancement by 3-4 cm. These results will be called to the ordering clinician or representative by the Radiologist Assistant, and communication documented in the PACS or [employer]. Electronically Signed   By: Valetta Mole M.D.   On: 05/24/2021 15:18    Anti-infectives: Anti-infectives (From admission, onward)    Start     Dose/Rate Route Frequency Ordered Stop   05/24/21 1730  piperacillin-tazobactam (ZOSYN) IVPB 2.25 g        2.25 g 100 mL/hr over 30 Minutes Intravenous Every 8 hours 05/24/21 1654     05/24/21 1615  metroNIDAZOLE (FLAGYL) IVPB 500 mg       Note to Pharmacy: Please tube to #[phone]    500 mg 100 mL/hr over 60 Minutes Intravenous  Once 05/24/21 1603 05/24/21 1630        Assessment/Plan POD 2, s/p dx lap converted to ex lap for SBR and cholecystectomy for gangrenous cholecystitis and chronic ischemic segment of SB causing an obstruction, Dr. Donne Hazel 05/24/21 -[facility] -BID dressing changes -cont NGT for post op ileus -cont JP  drain due to intra-op findings -mobilize as able, therapies already written for. -TNA for nutritional support while has ileus -cont zosyn, likely for 5 days   FEN: NPO, NGT to LIWS, TPN VTE: SCDs, DVT prophylaxis on hold til hgb stabilizes ID: [email or web address]   ESRD - TTS, per renal CHF - per cards A. Fib on eliquis, on hold Anemia - hgb 6.9 this am, tx 1 unit.  Got 2/2 in OR yesterday (pRBC/FFP) Acute hypoxic respiratory failure - extubated and doing well   LOS: 7 days    Henreitta Cea, [facility] 05/26/2021, 7:42 AM Please see Amion for pager number during day hours 7:00am-4:30pm

## 2021-05-26 NOTE — Progress Notes (Signed)
Subjective:  Awake and alert Extubated yesterday, on RA Remains on TPN, has NGT for ileus Off pressors  Objective Vital signs in last 24 hours: Vitals:   05/26/21 0500 05/26/21 0700 05/26/21 0835 05/26/21 0840  BP: (!) 145/71 (!) 147/69    Pulse: 86 85    Resp: 14 12  12   Temp:   98.5 F (36.9 C)   TempSrc:   Oral   SpO2: 95% 98%  97%  Weight: 68.5 kg     Height:       Weight change: 4 kg  Physical Exam: General: intubated, awak;thin chronically ill male Heart: RRR no MRG noted Lungs: coarse BS b/l Abdomen: incisions bandaged Extremities: No pedal edema  dialysis Access: RUA aVF+ bruit  Dialysis Orders: TTS at AF 4hr, 400/500, EDW 65kg, 2K/2Ca, UFP #2, AVF, no heparin - Sensipar 30mg  PO q HD - Venofer 50mg  IV weekly - Mircera 236mcg IV q 2 weeks (last 11/8)   Problem/Plan;    SBO, recent gallstone with CBD/pancreatic duct stenting: s/p ex lap, sb resection. Cholecystectomy 05/24/21; on TPN ESRD:  Off schedule. HD today, ok to come to HD unit Hypertension/volume: BP stable off pressors, no Vol. excess on exam.  1-2L UF with HD as bp permits  Anemia of ESRD and history of sickle cell= transfusion as needed; Aranesp missed with HD 12/03 -> given subcu 12/04 200 MCG.  We will continue weekly  Metabolic bone disease: Ca/Phos ok - resume binders when cleared to eat.  Nutrition: on TPN. Has NGT, will order supplements when cleared to eat.  A-fib: per cardiology    Rexene Agent, MD  [facility] 05/26/2021,8:49 AM  LOS: 7 days   Labs: Basic Metabolic Panel: Recent Labs  Lab 05/24/21 0400 05/24/21 1541 05/24/21 1855 05/25/21 0609 05/26/21 0508  NA 138   < > 137 134* 134*  K 3.9   < > 3.3* 3.6 3.3*  CL 95*   < > 100 98 96*  CO2 29  --  26 25 24   GLUCOSE 108*   < > 140* 210* 102*  BUN 81*   < > 21 30* 47*  CREATININE 7.06*   < > 2.89* 3.69* 4.71*  CALCIUM 8.6*  --  7.7* 8.0* 8.3*  PHOS 6.2*  --   --  4.1 4.1   < > = values in this interval not  displayed.    Liver Function Tests: Recent Labs  Lab 05/23/21 0131 05/24/21 0400 05/25/21 0609  AST 33 35 32  ALT 16 18 16   ALKPHOS 168* 163* 110  BILITOT 2.0* 2.3* 1.5*  PROT 5.9* 6.0* 4.8*  ALBUMIN 2.4* 2.4* 1.9*    Recent Labs  Lab 05/19/21 1445  LIPASE 51    No results for input(s): AMMONIA in the last 168 hours. CBC: Recent Labs  Lab 05/22/21 0252 05/23/21 0131 05/24/21 0400 05/24/21 1541 05/24/21 1855 05/25/21 0609 05/25/21 1335  WBC 7.2 9.0 8.8  --  5.6 11.3*  --   NEUTROABS 6.0 6.6 6.8  --   --   --   --   HGB 7.6* 7.8* 7.5*   < > 7.9* 6.9* 8.7*  HCT 21.0* 22.5* 21.9*   < > 22.6* 19.0* 24.4*  MCV 96.8 100.0 100.0  --  93.0 93.1  --   PLT 176 193 174  --  145* 113*  --    < > = values in this interval not displayed.    Cardiac Enzymes: No results  for input(s): CKTOTAL, CKMB, CKMBINDEX, TROPONINI in the last 168 hours. CBG: Recent Labs  Lab 05/25/21 1529 05/25/21 1950 05/25/21 2305 05/26/21 0408 05/26/21 0832  GLUCAP 140* 121* 100* 116* 131*      Medications:  amiodarone Stopped (05/25/21 1553)   methocarbamol (ROBAXIN) IV 500 mg (05/26/21 0515)   piperacillin-tazobactam (ZOSYN)  IV Stopped (05/26/21 0219)   TPN ADULT (ION) 65 mL/hr at 05/26/21 0300    arformoterol  15 mcg Nebulization BID   budesonide (PULMICORT) nebulizer solution  0.25 mg Nebulization BID   Chlorhexidine Gluconate Cloth  6 each Topical Daily   [START ON 05/27/2021] darbepoetin (ARANESP) injection - DIALYSIS  200 mcg Intravenous Q Sat-HD   HYDROmorphone   Intravenous Q4H   insulin aspart  0-15 Units Subcutaneous Q4H   mouth rinse  15 mL Mouth Rinse BID   pantoprazole (PROTONIX) IV  40 mg Intravenous Q12H   revefenacin  175 mcg Nebulization Daily   sodium chloride flush  10-40 mL Intracatheter Q12H

## 2021-05-26 NOTE — Plan of Care (Signed)

## 2021-05-26 NOTE — Progress Notes (Addendum)
Advanced Heart Failure Rounding Note  PCP-Cardiologist: Dr. Haroldine Laws   Subjective:    S/p lap, cholecystectomy and SB resection 12/07.  Extubated 12/8   POD#2. Mild post op pain. On PCA pump. No BM yet. Not passing flatus.  + NGT   Off pressors w/ stable BP.   Remains in Afib, rate controlled in 70s. Remains off amio and eliquis. Hgb up 6.9>>8.1 post transfusion.    No other complaints. Denies CP. No dyspnea.   Echo 12/06: EF 40-45%, RV mildly reduced, RVSP 55 mmHg, severe MR, severe TR   Objective:   Weight Range: 68.5 kg Body mass index is 21.06 kg/m.   Vital Signs:   Temp:  [97.9 F (36.6 C)-98.5 F (36.9 C)] 98.5 F (36.9 C) (12/09 0835) Pulse Rate:  [63-98] 82 (12/09 1000) Resp:  [11-20] 12 (12/09 1000) BP: (124-163)/(56-81) 144/72 (12/09 1000) SpO2:  [92 %-100 %] 96 % (12/09 1000) Arterial Line BP: (134-347)/(49-161) 157/59 (12/08 1530) FiO2 (%):  [0 %-21 %] 21 % (12/09 0840) Weight:  [68.5 kg] 68.5 kg (12/09 0500) Last BM Date: 05/22/21  Weight change: Filed Weights   05/24/21 1045 05/25/21 0500 05/26/21 0500  Weight: 64.5 kg 65.1 kg 68.5 kg    Intake/Output:   Intake/Output Summary (Last 24 hours) at 05/26/2021 1030 Last data filed at 05/26/2021 0900 Gross per 24 hour  Intake 1497.78 ml  Output 435 ml  Net 1062.78 ml      Physical Exam    General:  chronically ill appearing male, awake and alert. No respiratory difficulty HEENT: + EGT  Neck: supple. no JVD. Carotids 2+ bilat; no bruits. No lymphadenopathy or thyromegaly appreciated. Cor: PMI nondisplaced. Irregularly irregular rhythm and rate. No rubs, gallops or murmurs. Lungs: clear Abdomen: + surgical dressing. Hypoactive BS Extremities: no cyanosis, clubbing, rash, edema RUE AVF + thrill  Neuro: alert & oriented x 3, cranial nerves grossly intact. moves all 4 extremities w/o difficulty. Affect pleasant.  Telemetry   Chronic Afib 70s. Personally reviewed   EKG    No new EKG to  review   Labs    CBC Recent Labs    05/24/21 0400 05/24/21 1541 05/25/21 0609 05/25/21 1335 05/26/21 0508  WBC 8.8   < > 11.3*  --  14.5*  NEUTROABS 6.8  --   --   --   --   HGB 7.5*   < > 6.9* 8.7* 8.1*  HCT 21.9*   < > 19.0* 24.4* 23.0*  MCV 100.0   < > 93.1  --  92.7  PLT 174   < > 113*  --  122*   < > = values in this interval not displayed.   Basic Metabolic Panel Recent Labs    05/25/21 0609 05/26/21 0508  NA 134* 134*  K 3.6 3.3*  CL 98 96*  CO2 25 24  GLUCOSE 210* 102*  BUN 30* 47*  CREATININE 3.69* 4.71*  CALCIUM 8.0* 8.3*  MG 1.9 2.1  PHOS 4.1 4.1   Liver Function Tests Recent Labs    05/24/21 0400 05/25/21 0609  AST 35 32  ALT 18 16  ALKPHOS 163* 110  BILITOT 2.3* 1.5*  PROT 6.0* 4.8*  ALBUMIN 2.4* 1.9*   No results for input(s): LIPASE, AMYLASE in the last 72 hours. Cardiac Enzymes No results for input(s): CKTOTAL, CKMB, CKMBINDEX, TROPONINI in the last 72 hours.  BNP: BNP (last 3 results) Recent Labs    08/01/20 2047  BNP 2,730.5*  ProBNP (last 3 results) No results for input(s): PROBNP in the last 8760 hours.   D-Dimer No results for input(s): DDIMER in the last 72 hours. Hemoglobin A1C No results for input(s): HGBA1C in the last 72 hours. Fasting Lipid Panel No results for input(s): CHOL, HDL, LDLCALC, TRIG, CHOLHDL, LDLDIRECT in the last 72 hours.  Thyroid Function Tests No results for input(s): TSH, T4TOTAL, T3FREE, THYROIDAB in the last 72 hours.  Invalid input(s): FREET3  Other results:   Imaging    No results found.   Medications:     Scheduled Medications:  arformoterol  15 mcg Nebulization BID   budesonide (PULMICORT) nebulizer solution  0.25 mg Nebulization BID   Chlorhexidine Gluconate Cloth  6 each Topical Daily   [START ON 05/27/2021] darbepoetin (ARANESP) injection - DIALYSIS  200 mcg Intravenous Q Sat-HD   HYDROmorphone   Intravenous Q4H   insulin aspart  0-9 Units Subcutaneous Q8H   mouth  rinse  15 mL Mouth Rinse BID   pantoprazole (PROTONIX) IV  40 mg Intravenous Q12H   revefenacin  175 mcg Nebulization Daily   sodium chloride flush  10-40 mL Intracatheter Q12H    Infusions:  amiodarone Stopped (05/25/21 1553)   methocarbamol (ROBAXIN) IV 500 mg (05/26/21 0515)   piperacillin-tazobactam (ZOSYN)  IV Stopped (05/26/21 0219)   potassium chloride     TPN ADULT (ION) 65 mL/hr at 05/26/21 0300   TPN ADULT (ION)      PRN Medications: albuterol, diphenhydrAMINE **OR** diphenhydrAMINE, hydrALAZINE, naloxone **AND** sodium chloride flush, ondansetron (ZOFRAN) IV, sodium chloride flush    Patient Profile   65 y/o chronically ill male with sickle cell dz, ESRD, chronic systolic HF EF 10%, pulmonary HTN, chronic AF and legal blindness. Admitted with recurrent SBO. We are asked to help manage AF with RVR and assess peri-operative risk.   Assessment/Plan    1. Persistent Atrial Fibrillation/AFL - S/p DCCV 05/2018 - S/P DCCV 10/06/19 NSR - Recurrence 05/21. Has been seen in AF Clinic and rate control strategy recommended - currently well rate controlled. Now off amio. Can restart gtt if HR increases  - Holding anticoagulation d/t a/c anemia and recent surgery    2. Chronic combined Systolic/Diastolic Heart Failure:  - NICM. Cath 05/2011 without significant coronary disease.   - ECHO 12/2015 40%.  - Echo 05/2018: EF 25-30%.  - Echo 3/21 LVEF 35-40%, low normal RV function,severe pulm HTN. Severe LAE, mod RAE, mod MR, severe TR. RV TAPSE 1.6, RV s' 10 - Echo 9/21 EF 40-45%, RV mildly reduced, mild RAE, severe pulm artery systolic pressure, severe TR - Echo this admit suggestive of restrictive CM with EF 40-45%, RV mildly decreased, severe MR, severe TR, RVSP 55 mmHg - NYHA III at baseline - Volume managed with HD on T,T,S, going for HD today  - IV hydralazine PRN  - Off Entresto with ESRD   3. Anemia with H/o GI bleeding: - Prior EGD in 2019 showed nonbleeding gastritis,  Colonoscopy showed small internal hemorrhoids, otherwise normal.  - GI bleed 02/22 - gastric erosions on EGD - A/C anemia 11/22 - transfused with 3 uPRBCs - A/C anemia, received 5 u PRBcs so far this admit, 2 u FFP - Hgb 8.1 today  - Baseline hgb 8-9 - Received aranesp - Holding anticoagulation. May need to weigh risks/benefits of resuming anticoagulation with hx recurrent bleeding.    4. SBO/gangrenous cholecystitis - s/p lap, cholecystectomy and SB resection 12/07 - Has open abdominal wound - on zosyn  6. Pulmonary HTN  - Last echo 09/21 with EF 40-45%, RV mildly reduced, RVSP 75 mmHg, severe TR - Repeat echo this admit with EF 40-45%, RV mildly reduced, severe MR, severe TR, RVSP 55 mmHg   7. Abdominal ascites -Noted on CT -Mild hepatic nodularity present   8. HTN: - Moderately elevated, suspect pain contributing  - PRN IV hydralazine    9.  Sickle Cell:   - Follows closely with [facility].    1. Former smoker:   - Quit 11/2015.   11. ESRD - On HD now T/T/S - Nephrology following  12. Hypokalemia - K 3.3 - IV K supp ordered      Length of Stay: [address] PA-C  05/26/2021, 10:30 AM  Advanced Heart Failure Team Pager [phone] (M-F; 7a - 5p)  Please contact [facility] for night-coverage after hours (5p -7a ) and weekends on [email or web address]    Patient seen and examined with the above-signed Advanced Practice Provider and/or Housestaff. I personally reviewed laboratory data, imaging studies and relevant notes. I independently examined the patient and formulated the important aspects of the plan. I have edited the note to reflect any of my changes or salient points. I have personally discussed the plan with the patient and/or family.  Stable post-op. Still not flatus. Remains in Chronic AF rate stable off amio. No Eliquis yet. Denies CP or SOB. Belly sore  General:  Sitting in chair HEENT: normal + NGT Neck: supple. JVP 7-8  Carotids 2+  bilat; no bruits. No lymphadenopathy or thryomegaly appreciated. Cor: PMI nondisplaced. Irregular rate & rhythm. No rubs, gallops or murmurs. Lungs: clear Abdomen: soft, nontender, + distended. Hypoactive bowel sounds. Extremities: no cyanosis, clubbing, rash, edema Neuro: alert & orientedx3, cranial nerves grossly intact. moves all 4 extremities w/o difficulty. Affect pleasant  Stable from a cardiac standpoint. AF rate stable off IV amio. Can restart as needed. Volume management per HD. Start Eliquis when ok from surgical perspective.   I will see again. Monday. Please page [facility] over the weekend for any acute cardiac issues.   Glori Bickers, MD  3:52 PM

## 2021-05-26 NOTE — Progress Notes (Signed)
Physical Therapy Treatment Patient Details Name: Patrick [name] MRN: [id number] DOB: 1955-10-10 Today's Date: 05/26/2021   History of Present Illness 65 y.o. male admitted 12/2 with SBO. Pt recently hospitalized 11/22-11/26 at [facility] in [place] for same reason s/p ERCP. 12/7 pt to OR for small bowel resection and open cholecystectomy without closure and remained on vent, extubated 12/8.  PMHx: ESRD  on HD, anemia of CKD, CHF, Afib    PT Comments    Pt pleasant and using PCA with continued report of pain 8/10 without overt signs of pain. Pt with increased activity tolerance able to progress to gait this session and increase HEP. Pt encouraged to be OOB daily with nursing staff and continue mobility. Will continue to follow.   HR 110-115 SPO2 90-95% on RA    Recommendations for follow up therapy are one component of a multi-disciplinary discharge planning process, led by the attending physician.  Recommendations may be updated based on patient status, additional functional criteria and insurance authorization.  Follow Up Recommendations  Skilled nursing-short term rehab (<3 hours/day)     Assistance Recommended at Discharge Intermittent Supervision/Assistance  Equipment Recommendations  BSC/3in1;Rolling walker (2 wheels)    Recommendations for Other Services       Precautions / Restrictions Precautions Precautions: Fall;Other (comment) Precaution Comments: blind, NGT, open abdomen, PCA     Mobility  Bed Mobility Overal bed mobility: Needs Assistance Bed Mobility: Rolling;Sidelying to Sit Rolling: Min assist Sidelying to sit: Min assist       General bed mobility comments: HOB 25 degrees with cues and assist of pad to rotate pelvis to roll with assist to lift trunk    Transfers Overall transfer level: Needs assistance   Transfers: Sit to/from Stand Sit to Stand: Min guard           General transfer comment: pt with good hand placement and guarding  for standing as pt reaching out for support to stabilize    Ambulation/Gait Ambulation/Gait assistance: Min guard Gait Distance (Feet): 150 Feet Assistive device: IV Pole Gait Pattern/deviations: Step-through pattern;Decreased stride length;Trunk flexed   Gait velocity interpretation: 1.31 - 2.62 ft/sec, indicative of limited community ambulator   General Gait Details: pt declined use of RW but then transitioned from single to bil UE support on IV pole with assist to direct pole. pt with flexed trunk with cues for upright posture limited by pain   Stairs             Wheelchair Mobility    Modified Rankin (Stroke Patients Only)       Balance Overall balance assessment: Mild deficits observed, not formally tested                                          Cognition Arousal/Alertness: Awake/alert Behavior During Therapy: WFL for tasks assessed/performed Overall Cognitive Status: Within Functional Limits for tasks assessed                                          Exercises General Exercises - Lower Extremity Long Arc Quad: AROM;Both;Seated;20 reps Hip Flexion/Marching: AROM;Both;Seated;20 reps    General Comments        Pertinent Vitals/Pain Faces Pain Scale: Hurts whole lot Pain Location: abdomen Pain Descriptors / Indicators: Guarding Pain Intervention(s): Limited  activity within patient's tolerance;Monitored during session;Repositioned;PCA encouraged    Home Living                          Prior Function            PT Goals (current goals can now be found in the care plan section) Progress towards PT goals: Progressing toward goals    Frequency    Min 3X/week      PT Plan Current plan remains appropriate    Co-evaluation              AM-PAC PT "6 Clicks" Mobility   Outcome Measure  Help needed turning from your back to your side while in a flat bed without using bedrails?: A Little Help  needed moving from lying on your back to sitting on the side of a flat bed without using bedrails?: A Little Help needed moving to and from a bed to a chair (including a wheelchair)?: A Little Help needed standing up from a chair using your arms (e.g., wheelchair or bedside chair)?: A Little Help needed to walk in hospital room?: A Lot Help needed climbing 3-5 steps with a railing? : Total 6 Click Score: 15    End of Session   Activity Tolerance: Patient tolerated treatment well Patient left: in chair;with call bell/phone within reach;with chair alarm set Nurse Communication: Mobility status PT Visit Diagnosis: Muscle weakness (generalized) (M62.81);Difficulty in walking, not elsewhere classified (R26.2);Other abnormalities of gait and mobility (R26.89)     Time: 4008-6761 PT Time Calculation (min) (ACUTE ONLY): 23 min  Charges:  $Gait Training: 8-22 mins $Therapeutic Exercise: 8-22 mins                     Khamari Sheehan P, PT Acute Rehabilitation Services Pager: [phone] Office: [place] 05/26/2021, 1:48 PM

## 2021-05-26 NOTE — Progress Notes (Signed)
PROGRESS NOTE    Patrick [name]  TMH:[id number] DOB: June 18, 1956 DOA: 05/19/2021 PCP: Dorena Dew, FNP    Chief Complaint  Patient presents with   Small Bowel Obstruction    Brief Narrative:   H/o sickle cell anemia,,  A. fib on Eliquis, combined CHF, ESRD on HD TTS who was recently hospitalized in [place] for Choledocholithiasis w recent ERCP/Sphinct/CBD and PD stenting/stone extractio, he declined cholecystectomy at that time., he presented to the [facility] ED on 12/2 due to abdominal pain, found to have SBO and gangrenous cholecystitis S/p lap, cholecystectomy and SB resection 12/07, post op transferred to icu on vent, Extubated 12/8 , transferred to [facility] on 12/9   Subjective:  POD#2 Sitting up in chair, NAD, reports pain is controlled by pca pump No flatus, + ng   Tolerating tpn  Assessment & Plan:   Principal Problem:   SBO (small bowel obstruction) (HCC) Active Problems:   Abdominal pain   Nausea & vomiting   Anemia   End stage renal disease (HCC)   Permanent atrial fibrillation (HCC)   Chronic systolic CHF (congestive heart failure) (HCC)   Protein-calorie malnutrition, severe   ABLA (acute blood loss anemia)   SBO/gangrenous cholecystitis -S/p Diagnostic laparoscopy,  Small bowel resection with primary anastomosis,   Open cholecystectomy on 12/7 -drain in place, Ng suction, tpn, zosyn,  -Awaiting for bowel function -Plan per general surgery  Afib Remain in afib, rate controlled  On and off amiodarone drip for rate control Anticoagulation held due to anemia and recent surgery Cardiology following, will follow cardiology recommendation  Chronic combined chf/Pulmonary HTN -Echo this admit suggestive of restrictive CM with EF 40-45%, RV mildly decreased, severe MR, severe TR, RVSP 55 mmHg -Volume managed by dialysis Cardiology/heart failure team following  HTN: bp stable off home bp meds  ESRD Hypokalemia HD per Nephrology   Acute blood loss  anemia there is history of sickle cell anemia and anemia of chronic disease Prbcx5units so far FFP x2 on 12/7 Monitor cbc  Anemia with h/o GI bleed, seen by GI, no plan to  repeat egd as patient had multiple EGD in the past withseveral findings but insignificant per GI GI recommend PPI twice daily Detail please see GI notes from 05/22/2021  Sickle cell anemia Home medication Hydrea on hold due to n.p.o. status  Legally blind, reports lives by himself  COPD stable on Brovana ,Pulmicort ,yupelri, quit smoking in 2017 No wheezing on exam, no cough  Nutritional Assessment:  The patient's BMI is: Body mass index is 21.06 kg/m.Marland Kitchen  Seen by dietician.  I agree with the assessment and plan as outlined below:  Nutrition Status: Nutrition Problem: Severe Malnutrition Etiology: acute illness (SBO) Signs/Symptoms: moderate fat depletion, moderate muscle depletion, percent weight loss (8% weight loss within 3 months) Percent weight loss: 8 % Interventions: TPN  .    Unresulted Labs (From admission, onward)     Start     Ordered   05/29/21 0500  Triglycerides  (TPN Lab Panel)  Every Monday (0500),   R     Question:  Specimen collection method  Answer:  Lab=Lab collect   05/23/21 1021   05/26/21 0500  CBC  Daily,   STAT     Question:  Specimen collection method  Answer:  Unit=Unit collect   05/25/21 0915   05/25/21 0500  Comprehensive metabolic panel  (TPN Lab Panel)  Every Mon,Thu (0500),   R     Question:  Specimen collection method  Answer:  Lab=Lab collect   05/23/21 1021   05/25/21 0500  Magnesium  (TPN Lab Panel)  Every Mon,Thu (0500),   R     Question:  Specimen collection method  Answer:  Lab=Lab collect   05/23/21 1021   05/25/21 0500  Phosphorus  (TPN Lab Panel)  Every Mon,Thu (0500),   R     Question:  Specimen collection method  Answer:  Lab=Lab collect   05/23/21 1021   05/24/21 1800  Blood gas, arterial  Once,   R        05/24/21 1727   05/23/21 0500  CBC  Tomorrow  morning,   R       Question:  Specimen collection method  Answer:  Lab=Lab collect   05/22/21 1600              DVT prophylaxis: SCDs Start: 05/19/21 2116   Code Status:full Family Communication: Patient Disposition:   Status is: Inpatient  Dispo: The patient is from: Home, lives by himself              Anticipated d/c is to: SNF              Anticipated d/c date is: To be determined                Consultants:  General surgery Cardiology Critical care Nephrology  Procedures:   Diagnostic laparoscopy,  Small bowel resection with primary anastomosis,   Open cholecystectomy on 12/7  Antimicrobials:   Anti-infectives (From admission, onward)    Start     Dose/Rate Route Frequency Ordered Stop   05/24/21 1730  piperacillin-tazobactam (ZOSYN) IVPB 2.25 g        2.25 g 100 mL/hr over 30 Minutes Intravenous Every 8 hours 05/24/21 1654     05/24/21 1615  metroNIDAZOLE (FLAGYL) IVPB 500 mg       Note to Pharmacy: Please tube to #[phone]    500 mg 100 mL/hr over 60 Minutes Intravenous  Once 05/24/21 1603 05/24/21 1630           Objective: Vitals:   05/26/21 1100 05/26/21 1157 05/26/21 1200 05/26/21 1221  BP: (!) 141/66  (!) 155/82   Pulse: 82  85   Resp: 12  12 14   Temp:  98.6 F (37 C)    TempSrc:  Oral    SpO2: 96%  94% 95%  Weight:      Height:        Intake/Output Summary (Last 24 hours) at 05/26/2021 1348 Last data filed at 05/26/2021 0900 Gross per 24 hour  Intake 881.67 ml  Output 310 ml  Net 571.67 ml   Filed Weights   05/24/21 1045 05/25/21 0500 05/26/21 0500  Weight: 64.5 kg 65.1 kg 68.5 kg    Examination:  General exam: alert, awake, communicative,calm , legally blind, able to see colors and shadows  Respiratory system: diminished at basis, no wheezing, no rales, no rhonchi. Respiratory effort normal. Cardiovascular system:  IRRR.  Gastrointestinal system: Abdomen post op changes, quiet abdomen, no significant tenderness, dressing intact,  drain to left ab. Central nervous system: Alert and oriented.  Extremities:  no edema Skin: No rashes, lesions or ulcers Psychiatry: Judgement and insight appear normal. Mood & affect appropriate.     Data Reviewed: I have personally reviewed following labs and imaging studies  CBC: Recent Labs  Lab 05/21/21 0248 05/21/21 1540 05/22/21 0252 05/23/21 0131 05/24/21 0400 05/24/21 1541 05/24/21 1756 05/24/21 1855 05/25/21 0609 05/25/21  1335 05/26/21 0508  WBC 7.7  --  7.2 9.0 8.8  --   --  5.6 11.3*  --  14.5*  NEUTROABS 5.4  --  6.0 6.6 6.8  --   --   --   --   --   --   HGB 7.1*   < > 7.6* 7.8* 7.5*   < > 7.8* 7.9* 6.9* 8.7* 8.1*  HCT 20.2*   < > 21.0* 22.5* 21.9*   < > 23.0* 22.6* 19.0* 24.4* 23.0*  MCV 99.5  --  96.8 100.0 100.0  --   --  93.0 93.1  --  92.7  PLT 172  --  176 193 174  --   --  145* 113*  --  122*   < > = values in this interval not displayed.    Basic Metabolic Panel: Recent Labs  Lab 05/22/21 0252 05/23/21 0131 05/24/21 0400 05/24/21 1541 05/24/21 1756 05/24/21 1855 05/25/21 0609 05/26/21 0508  NA 135 136 138 138 139 137 134* 134*  K 3.9 3.9 3.9 3.5 3.4* 3.3* 3.6 3.3*  CL 95* 95* 95* 97*  --  100 98 96*  CO2 27 27 29   --   --  26 25 24   GLUCOSE 103* 160* 108* 129*  --  140* 210* 102*  BUN 59* 75* 81* 21  --  21 30* 47*  CREATININE 4.85* 5.89* 7.06* 2.70*  --  2.89* 3.69* 4.71*  CALCIUM 8.2* 8.6* 8.6*  --   --  7.7* 8.0* 8.3*  MG 2.2 2.4 2.6*  --   --   --  1.9 2.1  PHOS 5.8* 5.3* 6.2*  --   --   --  4.1 4.1    GFR: Estimated Creatinine Clearance: 15.6 mL/min (A) (by C-G formula based on SCr of 4.71 mg/dL (H)).  Liver Function Tests: Recent Labs  Lab 05/21/21 0248 05/22/21 0252 05/23/21 0131 05/24/21 0400 05/25/21 0609  AST 35 27 33 35 32  ALT 18 17 16 18 16   ALKPHOS 140* 152* 168* 163* 110  BILITOT 3.6* 2.1* 2.0* 2.3* 1.5*  PROT 5.3* 5.1* 5.9* 6.0* 4.8*  ALBUMIN 2.2* 2.1* 2.4* 2.4* 1.9*    CBG: Recent Labs  Lab  05/25/21 1950 05/25/21 2305 05/26/21 0408 05/26/21 0832 05/26/21 1156  GLUCAP 121* 100* 116* 131* 95     Recent Results (from the past 240 hour(s))  Resp Panel by RT-PCR (Flu A&B, Covid) Nasopharyngeal Swab     Status: None   Collection Time: 05/19/21  6:29 PM   Specimen: Nasopharyngeal Swab; Nasopharyngeal(NP) swabs in vial transport medium  Result Value Ref Range Status   SARS Coronavirus 2 by RT PCR NEGATIVE NEGATIVE Final    Comment: (NOTE) SARS-CoV-2 target nucleic acids are NOT DETECTED.  The SARS-CoV-2 RNA is generally detectable in upper respiratory specimens during the acute phase of infection. The lowest concentration of SARS-CoV-2 viral copies this assay can detect is 138 copies/mL. A negative result does not preclude SARS-Cov-2 infection and should not be used as the sole basis for treatment or other patient management decisions. A negative result may occur with  improper specimen collection/handling, submission of specimen other than nasopharyngeal swab, presence of viral mutation(s) within the areas targeted by this assay, and inadequate number of viral copies(<138 copies/mL). A negative result must be combined with clinical observations, patient history, and epidemiological information. The expected result is Negative.  Fact Sheet for Patients:  EntrepreneurPulse.com.au  Fact Sheet for Healthcare Providers:  IncredibleEmployment.be  This test is no t yet approved or cleared by the [place] and  has been authorized for detection and/or diagnosis of SARS-CoV-2 by FDA under an Emergency Use Authorization (EUA). This EUA will remain  in effect (meaning this test can be used) for the duration of the COVID-19 declaration under Section 564(b)(1) of the Act, 21 U.S.C.section 360bbb-3(b)(1), unless the authorization is terminated  or revoked sooner.       Influenza A by PCR NEGATIVE NEGATIVE Final   Influenza B by PCR  NEGATIVE NEGATIVE Final    Comment: (NOTE) The Xpert Xpress SARS-CoV-2/FLU/RSV plus assay is intended as an aid in the diagnosis of influenza from Nasopharyngeal swab specimens and should not be used as a sole basis for treatment. Nasal washings and aspirates are unacceptable for Xpert Xpress SARS-CoV-2/FLU/RSV testing.  Fact Sheet for Patients: EntrepreneurPulse.com.au  Fact Sheet for Healthcare Providers: IncredibleEmployment.be  This test is not yet approved or cleared by the [place] FDA and has been authorized for detection and/or diagnosis of SARS-CoV-2 by FDA under an Emergency Use Authorization (EUA). This EUA will remain in effect (meaning this test can be used) for the duration of the COVID-19 declaration under Section 564(b)(1) of the Act, 21 U.S.C. section 360bbb-3(b)(1), unless the authorization is terminated or revoked.  Performed at [facility], [place] [address], [place], [place] [address]   MRSA Next Gen by PCR, Nasal     Status: None   Collection Time: 05/24/21  5:54 PM   Specimen: Nasal Mucosa; Nasal Swab  Result Value Ref Range Status   MRSA by PCR Next Gen NOT DETECTED NOT DETECTED Final    Comment: (NOTE) The GeneXpert MRSA Assay (FDA approved for NASAL specimens only), is one component of a comprehensive MRSA colonization surveillance program. It is not intended to diagnose MRSA infection nor to guide or monitor treatment for MRSA infections. Test performance is not FDA approved in patients less than 74 years old. Performed at [facility], [place] [address], [place], [place] [address]          Radiology Studies: DG Chest Port 1 View  Result Date: 05/25/2021 CLINICAL DATA:  Acute respiratory failure with hypoxia. EXAM: PORTABLE CHEST 1 VIEW COMPARISON:  05/24/2021. FINDINGS: The heart is enlarged and the pulmonary vasculature is distended. Atherosclerotic calcification of the aorta is noted. Mild  airspace disease is present at the left lung base. No effusion or pneumothorax is seen. An enteric tube courses over the left upper quadrant and out of the field of view. The endotracheal tube terminates 3.4 cm above the carina. A right internal jugular central venous catheter is stable in position. IMPRESSION: 1. Cardiomegaly with pulmonary vascular congestion. 2. Minimal airspace disease at the left lung base, possible atelectasis, edema, or infiltrate. 3. Medical devices as described above. Electronically Signed   By: Brett Fairy M.D.   On: 05/25/2021 04:21   DG Chest Port 1 View  Result Date: 05/24/2021 CLINICAL DATA:  Status post intubation. EXAM: PORTABLE CHEST 1 VIEW COMPARISON:  Chest radiograph dated 08/01/2020. FINDINGS: Endotracheal tube with tip approximately 6 cm above the carina. Enteric tube extends below the diaphragm with tip in the proximal stomach. Right IJ central venous line with tip at the cavoatrial junction. There is cardiomegaly with mild vascular congestion. No focal consolidation, pleural effusion, or pneumothorax atherosclerotic calcification of the aorta. No acute osseous pathology. IMPRESSION: 1. Endotracheal tube above the carina. 2. Cardiomegaly with mild vascular congestion. No  focal consolidation. Electronically Signed   By: Anner Crete M.D.   On: 05/24/2021 19:00   DG Abd Portable 1V  Result Date: 05/24/2021 CLINICAL DATA:  Small-bowel obstruction EXAM: PORTABLE ABDOMEN - 1 VIEW COMPARISON:  KUB obtained 1 day prior FINDINGS: The enteric catheter tip is in the stomach. The side hole is at the level of the GE junction. Recommend advancement by approximately 3-4 cm. Enteric contrast is again seen in the small bowel concentrated in the right hemiabdomen. The small bowel remains dilated measuring up to 4.4 cm in maximum diameter the dilated small bowel is similar in caliber to the prior study, though contrast may be present within the ascending colon. A biliary stent is  again noted. A calcified gallstone is again seen. The bones are stable. IMPRESSION: 1. Similar degree of dilation of small bowel in the right lower quadrant, but contrast may have passed into the ascending colon. Continued radiographic follow-up is recommended. 2. Enteric catheter side hole is at the level of the GE junction. Recommend advancement by 3-4 cm. These results will be called to the ordering clinician or representative by the Radiologist Assistant, and communication documented in the PACS or [employer]. Electronically Signed   By: Valetta Mole M.D.   On: 05/24/2021 15:18        Scheduled Meds:  arformoterol  15 mcg Nebulization BID   budesonide (PULMICORT) nebulizer solution  0.25 mg Nebulization BID   Chlorhexidine Gluconate Cloth  6 each Topical Daily   [START ON 05/27/2021] darbepoetin (ARANESP) injection - DIALYSIS  200 mcg Intravenous Q Sat-HD   HYDROmorphone   Intravenous Q4H   insulin aspart  0-9 Units Subcutaneous Q8H   mouth rinse  15 mL Mouth Rinse BID   pantoprazole (PROTONIX) IV  40 mg Intravenous Q12H   revefenacin  175 mcg Nebulization Daily   sodium chloride flush  10-40 mL Intracatheter Q12H   Continuous Infusions:  amiodarone Stopped (05/25/21 1553)   methocarbamol (ROBAXIN) IV 500 mg (05/26/21 0515)   piperacillin-tazobactam (ZOSYN)  IV 2.25 g (05/26/21 1211)   potassium chloride 10 mEq (05/26/21 1307)   TPN ADULT (ION) 65 mL/hr at 05/26/21 0300   TPN ADULT (ION)       LOS: 7 days   Time spent: 24mins Greater than 50% of this time was spent in counseling, explanation of diagnosis, planning of further management, and coordination of care.   Voice Recognition Viviann Spare dictation system was used to create this note, attempts have been made to correct errors. Please contact the author with questions and/or clarifications.   Florencia Reasons, MD PhD FACP [facility]  Available via Epic secure chat 7am-7pm for nonurgent issues Please page for urgent  issues To page the attending provider between 7A-7P or the covering provider during after hours 7P-7A, please log into the web site [email or web address] and access using universal [place] password for that web site. If you do not have the password, please call the hospital operator.    05/26/2021, 1:48 PM

## 2021-05-27 DIAGNOSIS — K810 Acute cholecystitis: Secondary | ICD-10-CM

## 2021-05-27 DIAGNOSIS — R1084 Generalized abdominal pain: Secondary | ICD-10-CM | POA: Diagnosis not present

## 2021-05-27 DIAGNOSIS — I5022 Chronic systolic (congestive) heart failure: Secondary | ICD-10-CM | POA: Diagnosis not present

## 2021-05-27 DIAGNOSIS — K559 Vascular disorder of intestine, unspecified: Secondary | ICD-10-CM

## 2021-05-27 DIAGNOSIS — N186 End stage renal disease: Secondary | ICD-10-CM | POA: Diagnosis not present

## 2021-05-27 DIAGNOSIS — K56609 Unspecified intestinal obstruction, unspecified as to partial versus complete obstruction: Secondary | ICD-10-CM | POA: Diagnosis not present

## 2021-05-27 HISTORY — DX: Acute cholecystitis: K81.0

## 2021-05-27 LAB — CBC
HCT: 22.8 % — ABNORMAL LOW (ref 39.0–52.0)
Hemoglobin: 8 g/dL — ABNORMAL LOW (ref 13.0–17.0)
MCH: 32.3 pg (ref 26.0–34.0)
MCHC: 35.1 g/dL (ref 30.0–36.0)
MCV: 91.9 fL (ref 80.0–100.0)
Platelets: 120 10*3/uL — ABNORMAL LOW (ref 150–400)
RBC: 2.48 MIL/uL — ABNORMAL LOW (ref 4.22–5.81)
RDW: 23.2 % — ABNORMAL HIGH (ref 11.5–15.5)
WBC: 12.5 10*3/uL — ABNORMAL HIGH (ref 4.0–10.5)
nRBC: 1.6 % — ABNORMAL HIGH (ref 0.0–0.2)

## 2021-05-27 LAB — GLUCOSE, CAPILLARY
Glucose-Capillary: 116 mg/dL — ABNORMAL HIGH (ref 70–99)
Glucose-Capillary: 117 mg/dL — ABNORMAL HIGH (ref 70–99)

## 2021-05-27 LAB — BASIC METABOLIC PANEL
Anion gap: 7 (ref 5–15)
BUN: 29 mg/dL — ABNORMAL HIGH (ref 8–23)
CO2: 27 mmol/L (ref 22–32)
Calcium: 8.6 mg/dL — ABNORMAL LOW (ref 8.9–10.3)
Chloride: 100 mmol/L (ref 98–111)
Creatinine, Ser: 3.44 mg/dL — ABNORMAL HIGH (ref 0.61–1.24)
GFR, Estimated: 19 mL/min — ABNORMAL LOW (ref >60)
Glucose, Bld: 116 mg/dL — ABNORMAL HIGH (ref 70–99)
Potassium: 3.6 mmol/L (ref 3.5–5.1)
Sodium: 134 mmol/L — ABNORMAL LOW (ref 135–145)

## 2021-05-27 MED ORDER — TRACE MINERALS CU-MN-SE-ZN 300-55-60-3000 MCG/ML IV SOLN
INTRAVENOUS | Status: AC
  Filled 2021-05-27: qty 676

## 2021-05-27 MED ORDER — HYDROMORPHONE HCL 1 MG/ML IJ SOLN
0.5000 mg | INTRAMUSCULAR | Status: DC | PRN
Start: 2021-05-27 — End: 2021-05-30
  Administered 2021-05-27: 0.5 mg via INTRAVENOUS

## 2021-05-27 MED ORDER — METHOCARBAMOL 1000 MG/10ML IJ SOLN
1000.0000 mg | Freq: Four times a day (QID) | INTRAVENOUS | Status: DC | PRN
  Filled 2021-05-27: qty 10

## 2021-05-27 NOTE — Progress Notes (Signed)
Subjective:  Awake and alert on RA Did well with HD yesterday, 2L UF Remains on TPN, has NGT for ileus  Objective Vital signs in last 24 hours: Vitals:   05/27/21 0800 05/27/21 0850 05/27/21 0900 05/27/21 0915  BP: 110/78  122/67   Pulse: 92  90   Resp: (!) 9  (!) 8 14  Temp:      TempSrc:      SpO2: 97% 100% 100% 96%  Weight:      Height:       Weight change: 1 kg  Physical Exam: General: intubated, awak;thin chronically ill male Heart: RRR no MRG noted Lungs: coarse BS b/l Abdomen: incisions bandaged Extremities: No pedal edema  dialysis Access: RUA aVF+ bruit  Dialysis Orders: TTS at AF 4hr, 400/500, EDW 65kg, 2K/2Ca, UFP #2, AVF, no heparin - Sensipar 30mg  PO q HD - Venofer 50mg  IV weekly - Mircera 253mcg IV q 2 weeks (last 11/8)   Problem/Plan;    SBO, recent gallstone with CBD/pancreatic duct stenting: s/p ex lap, sb resection. Cholecystectomy 05/24/21; on TPN ESRD:  Off schedule. Next Tx Monday 12/12 Hypertension/volume: BP stable off pressors, no Vol. excess on exam.   Anemia of ESRD and history of sickle cell= transfusion as needed; Aranesp missed with HD 12/03 -> given subcu 12/04 200 MCG.  We will continue weekly  Metabolic bone disease: Ca/Phos ok - resume binders when cleared to eat.  Nutrition: on TPN. Has NGT, will order supplements when cleared to eat.  A-fib: per cardiology    Rexene Agent, MD  [facility] 05/27/2021,10:03 AM  LOS: 8 days   Labs: Basic Metabolic Panel: Recent Labs  Lab 05/24/21 0400 05/24/21 1541 05/25/21 0609 05/26/21 0508 05/27/21 0751  NA 138   < > 134* 134* 134*  K 3.9   < > 3.6 3.3* 3.6  CL 95*   < > 98 96* 100  CO2 29   < > 25 24 27   GLUCOSE 108*   < > 210* 102* 116*  BUN 81*   < > 30* 47* 29*  CREATININE 7.06*   < > 3.69* 4.71* 3.44*  CALCIUM 8.6*   < > 8.0* 8.3* 8.6*  PHOS 6.2*  --  4.1 4.1  --    < > = values in this interval not displayed.    Liver Function Tests: Recent Labs  Lab  05/23/21 0131 05/24/21 0400 05/25/21 0609  AST 33 35 32  ALT 16 18 16   ALKPHOS 168* 163* 110  BILITOT 2.0* 2.3* 1.5*  PROT 5.9* 6.0* 4.8*  ALBUMIN 2.4* 2.4* 1.9*    No results for input(s): LIPASE, AMYLASE in the last 168 hours.  No results for input(s): AMMONIA in the last 168 hours. CBC: Recent Labs  Lab 05/22/21 0252 05/23/21 0131 05/24/21 0400 05/24/21 1541 05/24/21 1855 05/25/21 0609 05/25/21 1335 05/26/21 0508 05/27/21 0600  WBC 7.2 9.0 8.8  --  5.6 11.3*  --  14.5* 12.5*  NEUTROABS 6.0 6.6 6.8  --   --   --   --   --   --   HGB 7.6* 7.8* 7.5*   < > 7.9* 6.9* 8.7* 8.1* 8.0*  HCT 21.0* 22.5* 21.9*   < > 22.6* 19.0* 24.4* 23.0* 22.8*  MCV 96.8 100.0 100.0  --  93.0 93.1  --  92.7 91.9  PLT 176 193 174  --  145* 113*  --  122* 120*   < > = values in this  interval not displayed.    Cardiac Enzymes: No results for input(s): CKTOTAL, CKMB, CKMBINDEX, TROPONINI in the last 168 hours. CBG: Recent Labs  Lab 05/26/21 0832 05/26/21 1156 05/26/21 2105 05/27/21 0613 05/27/21 0751  GLUCAP 131* 95 114* 116* 117*      Medications:  amiodarone Stopped (05/25/21 1553)   methocarbamol (ROBAXIN) IV 500 mg (05/27/21 0618)   piperacillin-tazobactam (ZOSYN)  IV 2.25 g (05/27/21 0112)   TPN ADULT (ION) 65 mL/hr at 05/26/21 1840    arformoterol  15 mcg Nebulization BID   budesonide (PULMICORT) nebulizer solution  0.25 mg Nebulization BID   Chlorhexidine Gluconate Cloth  6 each Topical Daily   darbepoetin (ARANESP) injection - DIALYSIS  200 mcg Intravenous Q Sat-HD   HYDROmorphone   Intravenous Q4H   insulin aspart  0-9 Units Subcutaneous Q8H   mouth rinse  15 mL Mouth Rinse BID   pantoprazole (PROTONIX) IV  40 mg Intravenous Q12H   revefenacin  175 mcg Nebulization Daily   sodium chloride flush  10-40 mL Intracatheter Q12H

## 2021-05-27 NOTE — Progress Notes (Signed)
PHARMACY - TOTAL PARENTERAL NUTRITION CONSULT NOTE  Indication: Small bowel obstruction  Patient Measurements: Height: 5\' 11"  (180.3 cm) Weight: 68.1 kg (150 lb 2.1 oz) IBW/kg (Calculated) : 75.3 TPN AdjBW (KG): 67.6 Body mass index is 20.94 kg/m. Usual Weight: 154 lb 02/28/21  Assessment:  30 yom with ESRD admitted with worsening abdominal pain, found to have high-grade SBO. He had recent admission at [facility] for SBO resolved with conservative management; also had choledocholithiasis s/p ERCP with stone extraction, stent to CBD and PD. Lap chole recommended but declined. Surgery reports no flatus or BM except with enema on 12/5 and unlikely to resolve without surgical intervention. Given prolonged NPO status, pharmacy consulted for TPN for nutritional support. At risk for refeeding syndrome.  Glucose / Insulin: no hx DM, CBGs controlled Utilized 2 units SSI in last 24hrs Electrolytes: Na 134 stable, K up to 3.6 (s/p K runs x 2 yesterday, using high-K HD bath; goal >/= 4 for Afib/ileus), corrected Ca high-normal ~10.2, others stable WNL Renal: ESRD on HD TTS - off schedule. Next planned 12/12 Hepatic: LFTs / TG WNL, Tbili down to 1.5 (no evidence of jaundice per RN 12/7), albumin 1.9 Intake / Output; MIVF: still makes minimal amount of urine; NG output 140mL, drain output 53mL, LBM 12/5 GI Imaging:  12/3 Abd XR: Probable persistent SBO not well evaluated 12/4 CT A/P: diffusely dilated SB with decompressed terminal ileum c/w SBO 12/6 Abd XR: dilation of small-bowel loops suggesting ileus or pSBO 12/7 Abd XR: unchanged GI Surgeries / Procedures:  12/7 - cholecystectomy with SBR  Central access: IJ placed by IR 05/23/21 TPN start date: 05/23/21  Nutritional Goals: RD Estimated Needs Total Energy Estimated Needs: 1900-2100 Total Protein Estimated Needs: 100-115 gm Total Fluid Estimated Needs: 1.9 L  Current Nutrition:  TPN; NPO  Plan:  Continue concentrated TPN at goal rate  of 65 mL/hr to provide 101g AA, 273g CHO and 58g ILE for a total of 1912 kCal, meeting 100% of needs Electrolytes in TPN: Na 179mEq/L, K 57mEq/L (= 15 mEq/d) per discussion with Renal, Ca 17mEq/L, Mag 27mEq/L, Phos 48mmol/L since 12/6, max CL Add standard MVI and trace elements to TPN; remove chromium in patient on RRT D/c SSI due to minimal usage - monitor daily CBG check and adjust as needed F/U AM labs (Renal using high K baths)   Arturo Morton, PharmD, BCPS Please check AMION for all [place] contact numbers Clinical Pharmacist 05/27/2021 7:22 AM

## 2021-05-27 NOTE — Progress Notes (Signed)
Patrick [name] [id number] 04-Sep-1955  CARE TEAM:  PCP: Dorena Dew, FNP  Outpatient Care Team: Patient Care Team: Dorena Dew, FNP as PCP - General (Family Medicine) Zadie Rhine Clent Demark, MD as Consulting Physician (Ophthalmology) Jamal Maes, MD as Consulting Physician (Nephrology)  Inpatient Treatment Team: Treatment Team: Attending Provider: Florencia Reasons, MD; Consulting Physician: Edison Pace, Md, MD; Consulting Physician: Roney Jaffe, MD; Physician Assistant: Loren Racer, PA-C; Respiratory Therapist: Judith Part, RRT; Rounding Team: Lbcardiology, Michae Kava, MD; Case Manager: Rozell Searing, RN; Registered Nurse: Kayren Eaves, RN; Rounding Team: Clenton Pare, MD; Registered Nurse: Ignacia Palma, RN; Social Worker: Polo, Cortlin, LCSW; Utilization Review: Claudie Leach, RN; Registered Nurse: Charmayne Sheer, RN; Technician: Claude Manges, NT; Social Worker: Elliot Gurney M, [place]   Problem List:   Principal Problem:   SBO (small bowel obstruction) [facility]) Active Problems:   Abdominal pain   Nausea & vomiting   Anemia   End stage renal disease ([place])   Permanent atrial fibrillation ([place])   Chronic systolic CHF (congestive heart failure) ([place])   Protein-calorie malnutrition, severe   ABLA (acute blood loss anemia)   3 Days Post-Op  05/24/2021  Postoperative diagnosis:  Gangrenous cholecystitis,  chronic small bowel ischemia  Procedure:  1.  Diagnostic laparoscopy 2.  Small bowel resection with primary anastomosis 3.  Open cholecystectomy  Surgeon: Dr. Serita Grammes    Assessment  Recovering but guarded  [facility] Stay = 8 days)  s/p dx lap converted to ex lap for SB resection and cholecystectomy for gangrenous cholecystitis and chronic ischemic segment of SB causing an obstruction, Dr. Donne Hazel 05/24/21 -ERCP at [facility] -BID dressing changes - maybe switch to wound vac soon -cont NGT for post op ileus - I unclogged & d/w RN.   We wrote orders to help flush so it does not plug up again -cont JP drain due to intra-op findings - hopefully d/c prior to discharge out of hospital -mobilize as able, therapies already written for. -TPN for nutritional support while has ileus -cont zosyn x 5 days per protocol & regroup   FEN: NPO, NGT to LIWS, TPN VTE: SCDs, DVT prophylaxis on hold til hgb stabilizes ID: zosyn 12/8-   ESRD - TTS, per renal CHF - per cards A. Fib on eliquis, on hold Anemia - ABLA on top of chromnic disease - transfuse as needed Acute hypoxic respiratory failure - extubated and doing well      30 minutes spent in review, evaluation, examination, counseling, and coordination of care.   I have reviewed this patient's available data, including medical history, events of note, physical examination and test results as part of my evaluation.  A significant portion of that time was spent in counseling.  Care during the described time interval was provided by me.  05/27/2021    Subjective: (Chief complaint)  Remains in intensive care unit.  Using PCA fair amount with some abdominal soreness.  NG tube clogged.    Nursing just outside room  Objective:  Vital signs:  Vitals:   05/27/21 0800 05/27/21 0850 05/27/21 0900 05/27/21 0915  BP: 110/78  122/67   Pulse: 92  90   Resp: (!) 9  (!) 8 14  Temp:      TempSrc:      SpO2: 97% 100% 100% 96%  Weight:      Height:        Last BM Date: 05/22/21  Intake/Output   Yesterday:  12/09 0701 -  12/10 0700 In: 848.3 [I.V.:711.9; IV Piggyback:136.3] Out: 2175 [Emesis/NG output:100; Drains:60] This shift:  Total I/O In: -  Out: 25 [Drains:25]  Bowel function:  Flatus: No  BM:  No  Drain: Serous   Physical Exam:  General: Pt awake/alert in no acute distress Eyes: PERRL, normal EOM.  Sclera clear.  No icterus Neuro: CN II-XII intact w/o focal sensory/motor deficits. Lymph: No head/neck/groin lymphadenopathy Psych:  No  delerium/psychosis/paranoia.  Oriented x 4 HENT: Normocephalic, Mucus membranes moist.  No thrush Neck: Supple, No tracheal deviation.  No obvious thyromegaly Chest: No pain to chest wall compression.  Good respiratory excursion.  No audible wheezing CV:  Pulses intact.  Regular rhythm.  No major extremity edema MS: Normal AROM mjr joints.  No obvious deformity  Abdomen: Somewhat firm.  Moderately distended.  Tenderness at midline incision.  Dressing clean without any major bleeding or purulence at wound .  No evidence of peritonitis.  No incarcerated hernias.  Ext:   No deformity.  No mjr edema.  No cyanosis Skin: No petechiae / purpurea.  No major sores.  Warm and dry    Results:   Cultures: Recent Results (from the past 720 hour(s))  Resp Panel by RT-PCR (Flu A&B, Covid) Nasopharyngeal Swab     Status: None   Collection Time: 05/19/21  6:29 PM   Specimen: Nasopharyngeal Swab; Nasopharyngeal(NP) swabs in vial transport medium  Result Value Ref Range Status   SARS Coronavirus 2 by RT PCR NEGATIVE NEGATIVE Final    Comment: (NOTE) SARS-CoV-2 target nucleic acids are NOT DETECTED.  The SARS-CoV-2 RNA is generally detectable in upper respiratory specimens during the acute phase of infection. The lowest concentration of SARS-CoV-2 viral copies this assay can detect is 138 copies/mL. A negative result does not preclude SARS-Cov-2 infection and should not be used as the sole basis for treatment or other patient management decisions. A negative result may occur with  improper specimen collection/handling, submission of specimen other than nasopharyngeal swab, presence of viral mutation(s) within the areas targeted by this assay, and inadequate number of viral copies(<138 copies/mL). A negative result must be combined with clinical observations, patient history, and epidemiological information. The expected result is Negative.  Fact Sheet for Patients:   EntrepreneurPulse.com.au  Fact Sheet for Healthcare Providers:  IncredibleEmployment.be  This test is no t yet approved or cleared by the [place] FDA and  has been authorized for detection and/or diagnosis of SARS-CoV-2 by FDA under an Emergency Use Authorization (EUA). This EUA will remain  in effect (meaning this test can be used) for the duration of the COVID-19 declaration under Section 564(b)(1) of the Act, 21 U.S.C.section 360bbb-3(b)(1), unless the authorization is terminated  or revoked sooner.       Influenza A by PCR NEGATIVE NEGATIVE Final   Influenza B by PCR NEGATIVE NEGATIVE Final    Comment: (NOTE) The Xpert Xpress SARS-CoV-2/FLU/RSV plus assay is intended as an aid in the diagnosis of influenza from Nasopharyngeal swab specimens and should not be used as a sole basis for treatment. Nasal washings and aspirates are unacceptable for Xpert Xpress SARS-CoV-2/FLU/RSV testing.  Fact Sheet for Patients: EntrepreneurPulse.com.au  Fact Sheet for Healthcare Providers: IncredibleEmployment.be  This test is not yet approved or cleared by the [place] FDA and has been authorized for detection and/or diagnosis of SARS-CoV-2 by FDA under an Emergency Use Authorization (EUA). This EUA will remain in effect (meaning this test can be used) for the duration of the COVID-19 declaration under  Section 564(b)(1) of the Act, 21 U.S.C. section 360bbb-3(b)(1), unless the authorization is terminated or revoked.  Performed at [facility], [place] [address], [place], [place] [address]   MRSA Next Gen by PCR, Nasal     Status: None   Collection Time: 05/24/21  5:54 PM   Specimen: Nasal Mucosa; Nasal Swab  Result Value Ref Range Status   MRSA by PCR Next Gen NOT DETECTED NOT DETECTED Final    Comment: (NOTE) The GeneXpert MRSA Assay (FDA approved for NASAL specimens only), is one component of a  comprehensive MRSA colonization surveillance program. It is not intended to diagnose MRSA infection nor to guide or monitor treatment for MRSA infections. Test performance is not FDA approved in patients less than 58 years old. Performed at [facility], [place] [address] [place], [place] [address]     Labs: Results for orders placed or performed during the hospital encounter of 05/19/21 (from the past 48 hour(s))  Glucose, capillary     Status: Abnormal   Collection Time: 05/25/21 10:59 AM  Result Value Ref Range   Glucose-Capillary 196 (H) 70 - 99 mg/dL    Comment: Glucose reference range applies only to samples taken after fasting for at least 8 hours.  Hemoglobin and hematocrit, blood     Status: Abnormal   Collection Time: 05/25/21  1:35 PM  Result Value Ref Range   Hemoglobin 8.7 (L) 13.0 - 17.0 g/dL    Comment: REPEATED TO VERIFY POST TRANSFUSION SPECIMEN    HCT 24.4 (L) 39.0 - 52.0 %    Comment: Performed at [facility] [address], [place], [place] [address]  Hemoglobin A1c     Status: None   Collection Time: 05/25/21  1:35 PM  Result Value Ref Range   Hgb A1c MFr Bld 4.9 4.8 - 5.6 %    Comment: (NOTE) **Verified by repeat analysis**         Prediabetes: 5.7 - 6.4         Diabetes: >6.4         Glycemic control for adults with diabetes: <7.0    Mean Plasma Glucose 94 mg/dL    Comment: (NOTE) Performed At: [facility] [address] [place], [place] [id number] Rush Farmer MD DD:[id number]   Glucose, capillary     Status: Abnormal   Collection Time: 05/25/21  3:29 PM  Result Value Ref Range   Glucose-Capillary 140 (H) 70 - 99 mg/dL    Comment: Glucose reference range applies only to samples taken after fasting for at least 8 hours.  Glucose, capillary     Status: Abnormal   Collection Time: 05/25/21  7:50 PM  Result Value Ref Range   Glucose-Capillary 121 (H) 70 - 99 mg/dL    Comment: Glucose reference range applies only to samples  taken after fasting for at least 8 hours.  Glucose, capillary     Status: Abnormal   Collection Time: 05/25/21 11:05 PM  Result Value Ref Range   Glucose-Capillary 100 (H) 70 - 99 mg/dL    Comment: Glucose reference range applies only to samples taken after fasting for at least 8 hours.  Glucose, capillary     Status: Abnormal   Collection Time: 05/26/21  4:08 AM  Result Value Ref Range   Glucose-Capillary 116 (H) 70 - 99 mg/dL    Comment: Glucose reference range applies only to samples taken after fasting for at least 8 hours.  CBC  Status: Abnormal   Collection Time: 05/26/21  5:08 AM  Result Value Ref Range   WBC 14.5 (H) 4.0 - 10.5 K/uL   RBC 2.48 (L) 4.22 - 5.81 MIL/uL   Hemoglobin 8.1 (L) 13.0 - 17.0 g/dL   HCT 23.0 (L) 39.0 - 52.0 %   MCV 92.7 80.0 - 100.0 fL   MCH 32.7 26.0 - 34.0 pg   MCHC 35.2 30.0 - 36.0 g/dL   RDW 24.2 (H) 11.5 - 15.5 %   Platelets 122 (L) 150 - 400 K/uL    Comment: REPEATED TO VERIFY   nRBC 2.8 (H) 0.0 - 0.2 %    Comment: Performed at [facility], [place] [address], [place], [place] [address]  Magnesium     Status: None   Collection Time: 05/26/21  5:08 AM  Result Value Ref Range   Magnesium 2.1 1.7 - 2.4 mg/dL    Comment: Performed at [facility] [address], [place], [place] [address]  Phosphorus     Status: None   Collection Time: 05/26/21  5:08 AM  Result Value Ref Range   Phosphorus 4.1 2.5 - 4.6 mg/dL    Comment: Performed at [facility] [address], [place], [place] [address]  Basic metabolic panel     Status: Abnormal   Collection Time: 05/26/21  5:08 AM  Result Value Ref Range   Sodium 134 (L) 135 - 145 mmol/L   Potassium 3.3 (L) 3.5 - 5.1 mmol/L   Chloride 96 (L) 98 - 111 mmol/L   CO2 24 22 - 32 mmol/L   Glucose, Bld 102 (H) 70 - 99 mg/dL    Comment: Glucose reference range applies only to samples taken after fasting for at least 8 hours.   BUN 47 (H) 8 - 23 mg/dL   Creatinine, Ser 4.71 (H) 0.61 - 1.24  mg/dL   Calcium 8.3 (L) 8.9 - 10.3 mg/dL   GFR, Estimated 13 (L) >60 mL/min    Comment: (NOTE) Calculated using the CKD-EPI Creatinine Equation (2021)    Anion gap 14 5 - 15    Comment: Performed at [facility] [address], [place], [place] [address]  Glucose, capillary     Status: Abnormal   Collection Time: 05/26/21  8:32 AM  Result Value Ref Range   Glucose-Capillary 131 (H) 70 - 99 mg/dL    Comment: Glucose reference range applies only to samples taken after fasting for at least 8 hours.  Glucose, capillary     Status: None   Collection Time: 05/26/21 11:56 AM  Result Value Ref Range   Glucose-Capillary 95 70 - 99 mg/dL    Comment: Glucose reference range applies only to samples taken after fasting for at least 8 hours.  Glucose, capillary     Status: Abnormal   Collection Time: 05/26/21  9:05 PM  Result Value Ref Range   Glucose-Capillary 114 (H) 70 - 99 mg/dL    Comment: Glucose reference range applies only to samples taken after fasting for at least 8 hours.  CBC     Status: Abnormal   Collection Time: 05/27/21  6:00 AM  Result Value Ref Range   WBC 12.5 (H) 4.0 - 10.5 K/uL   RBC 2.48 (L) 4.22 - 5.81 MIL/uL   Hemoglobin 8.0 (L) 13.0 - 17.0 g/dL   HCT 22.8 (L) 39.0 - 52.0 %   MCV 91.9 80.0 - 100.0 fL   MCH 32.3 26.0 - 34.0  pg   MCHC 35.1 30.0 - 36.0 g/dL   RDW 23.2 (H) 11.5 - 15.5 %   Platelets 120 (L) 150 - 400 K/uL    Comment: Immature Platelet Fraction may be clinically indicated, consider ordering this additional test [id number] REPEATED TO VERIFY    nRBC 1.6 (H) 0.0 - 0.2 %    Comment: Performed at [facility] [address], [place], [place] [address]  Glucose, capillary     Status: Abnormal   Collection Time: 05/27/21  6:13 AM  Result Value Ref Range   Glucose-Capillary 116 (H) 70 - 99 mg/dL    Comment: Glucose reference range applies only to samples taken after fasting for at least 8 hours.  Basic metabolic panel     Status: Abnormal    Collection Time: 05/27/21  7:51 AM  Result Value Ref Range   Sodium 134 (L) 135 - 145 mmol/L   Potassium 3.6 3.5 - 5.1 mmol/L   Chloride 100 98 - 111 mmol/L   CO2 27 22 - 32 mmol/L   Glucose, Bld 116 (H) 70 - 99 mg/dL    Comment: Glucose reference range applies only to samples taken after fasting for at least 8 hours.   BUN 29 (H) 8 - 23 mg/dL   Creatinine, Ser 3.44 (H) 0.61 - 1.24 mg/dL   Calcium 8.6 (L) 8.9 - 10.3 mg/dL   GFR, Estimated 19 (L) >60 mL/min    Comment: (NOTE) Calculated using the CKD-EPI Creatinine Equation (2021)    Anion gap 7 5 - 15    Comment: Performed at [facility] [address] [place], [place] [address]  Glucose, capillary     Status: Abnormal   Collection Time: 05/27/21  7:51 AM  Result Value Ref Range   Glucose-Capillary 117 (H) 70 - 99 mg/dL    Comment: Glucose reference range applies only to samples taken after fasting for at least 8 hours.    Imaging / Studies: No results found.  Medications / Allergies: per chart  Antibiotics: Anti-infectives (From admission, onward)    Start     Dose/Rate Route Frequency Ordered Stop   05/24/21 1730  piperacillin-tazobactam (ZOSYN) IVPB 2.25 g        2.25 g 100 mL/hr over 30 Minutes Intravenous Every 8 hours 05/24/21 1654     05/24/21 1615  metroNIDAZOLE (FLAGYL) IVPB 500 mg       Note to Pharmacy: Please tube to #[phone]    500 mg 100 mL/hr over 60 Minutes Intravenous  Once 05/24/21 1603 05/24/21 1630         Note: Portions of this report may have been transcribed using voice recognition software. Every effort was made to ensure accuracy; however, inadvertent computerized transcription errors may be present.   Any transcriptional errors that result from this process are unintentional.    Adin Hector, MD, FACS, MASCRS Esophageal, Gastrointestinal & Colorectal Surgery Robotic and Minimally Invasive Surgery  [facility], [place]  [place].  [address] [place], [place] [address] [phone] Fax [phone] Main  CONTACT INFORMATION:  Weekday (9AM-5PM): Call [facility] main office at [phone]  Weeknight (5PM-9AM) or Weekend/Holiday: Check [email or web address] (password " [email or web address]") for General Surgery [facility] coverage  (Please, do not use SecureChat as it is not reliable communication to operating surgeons for immediate patient care)      05/27/2021  9:53 AM

## 2021-05-27 NOTE — Progress Notes (Signed)
PROGRESS NOTE    Patrick [name]  DZH:[id number] DOB: 25-Jul-1955 DOA: 05/19/2021 PCP: Dorena Dew, FNP    Chief Complaint  Patient presents with   Small Bowel Obstruction    Brief Narrative:   H/o sickle cell anemia,,  A. fib on Eliquis, combined CHF, ESRD on HD TTS who was recently hospitalized in [place] for Choledocholithiasis w recent ERCP/Sphinct/CBD and PD stenting/stone extractio, he declined cholecystectomy at that time., he presented to the [facility] ED on 12/2 due to abdominal pain, found to have SBO and gangrenous cholecystitis S/p lap, cholecystectomy and SB resection 12/07, post op transferred to icu on vent, Extubated 12/8 , transferred to [facility] on 12/9   Subjective:  POD#3  NAD, on room air, reports pain is controlled by pca pump No flatus, no bm, + ng  Tolerating tpn  Assessment & Plan:   Principal Problem:   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 05/24/2021 Active Problems:   Abdominal pain   Nausea & vomiting   Anemia   End stage renal disease (HCC)   Permanent atrial fibrillation (HCC)   Chronic systolic CHF (congestive heart failure) (HCC)   SBO (small bowel obstruction) (HCC)   Protein-calorie malnutrition, severe   ABLA (acute blood loss anemia)   Empyema of gallbladder   Acute gangrenous cholecystitis s/p open cholcystectomy 05/24/2021   SBO/gangrenous cholecystitis/post op ileus  -S/p Diagnostic laparoscopy,  Small bowel resection with primary anastomosis,   Open cholecystectomy on 12/7 -drain in place, Ng suction, tpn, zosyn,  -Awaiting for bowel function -Plan per general surgery  Afib Remain in afib, rate controlled  On and off amiodarone drip for rate control Anticoagulation held due to anemia and recent surgery Cardiology following, will follow cardiology recommendation  Chronic combined chf/Pulmonary HTN -Echo this admit suggestive of restrictive CM with EF 40-45%, RV mildly decreased, severe MR, severe TR, RVSP 55  mmHg -Volume managed by dialysis Cardiology/heart failure team following  HTN: bp stable off home bp meds  ESRD Electrolytes abnormalities  HD per Nephrology   Acute blood loss anemia / history of sickle cell anemia and anemia of chronic disease Prbcx5units so far FFP x2 on 12/7 Monitor cbc  Anemia with h/o GI bleed, seen by GI, no plan to  repeat egd as patient had multiple EGD in the past withseveral findings but insignificant per GI GI recommend PPI twice daily Detail please see GI notes from 05/22/2021  Sickle cell anemia Home medication Hydrea on hold due to n.p.o. status  Legally blind, reports lives by himself  COPD stable on Brovana ,Pulmicort ,yupelri, quit smoking in 2017 No wheezing on exam, no cough  Nutritional Assessment:  The patient's BMI is: Body mass index is 20.94 kg/m.Marland Kitchen  Seen by dietician.  I agree with the assessment and plan as outlined below:  Nutrition Status: Nutrition Problem: Severe Malnutrition Etiology: acute illness (SBO) Signs/Symptoms: moderate fat depletion, moderate muscle depletion, percent weight loss (8% weight loss within 3 months) Percent weight loss: 8 % Interventions: TPN  .    Unresulted Labs (From admission, onward)     Start     Ordered   05/29/21 0500  Triglycerides  (TPN Lab Panel)  Every Monday (0500),   R     Question:  Specimen collection method  Answer:  Lab=Lab collect   05/23/21 1021   05/28/21 4196  Basic metabolic panel  Tomorrow morning,   R       Question:  Specimen collection method  Answer:  Unit=Unit collect   05/27/21 1029   05/25/21 0500  Comprehensive metabolic panel  (TPN Lab Panel)  Every Mon,Thu (0500),   R     Question:  Specimen collection method  Answer:  Lab=Lab collect   05/23/21 1021   05/25/21 0500  Magnesium  (TPN Lab Panel)  Every Mon,Thu (0500),   R     Question:  Specimen collection method  Answer:  Lab=Lab collect   05/23/21 1021   05/25/21 0500  Phosphorus  (TPN Lab Panel)  Every  Mon,Thu (0500),   R     Question:  Specimen collection method  Answer:  Lab=Lab collect   05/23/21 1021   05/23/21 0500  CBC  Tomorrow morning,   R       Question:  Specimen collection method  Answer:  Lab=Lab collect   05/22/21 1600              DVT prophylaxis: SCDs Start: 05/19/21 2116   Code Status:full Family Communication: Patient Disposition:   Status is: Inpatient  Dispo: The patient is from: Home, lives by himself              Anticipated d/c is to: SNF              Anticipated d/c date is: To be determined                Consultants:  General surgery Cardiology Critical care Nephrology  Procedures:   Diagnostic laparoscopy,  Small bowel resection with primary anastomosis,   Open cholecystectomy on 12/7  Antimicrobials:   Anti-infectives (From admission, onward)    Start     Dose/Rate Route Frequency Ordered Stop   05/24/21 1730  piperacillin-tazobactam (ZOSYN) IVPB 2.25 g        2.25 g 100 mL/hr over 30 Minutes Intravenous Every 8 hours 05/24/21 1654     05/24/21 1615  metroNIDAZOLE (FLAGYL) IVPB 500 mg       Note to Pharmacy: Please tube to #[phone]    500 mg 100 mL/hr over 60 Minutes Intravenous  Once 05/24/21 1603 05/24/21 1630           Objective: Vitals:   05/27/21 1154 05/27/21 1200 05/27/21 1515 05/27/21 1645  BP:  125/79    Pulse:  88    Resp: 13 10  12   Temp:   98.5 F (36.9 C)   TempSrc:   Oral   SpO2: 100% 90%  94%  Weight:      Height:        Intake/Output Summary (Last 24 hours) at 05/27/2021 1651 Last data filed at 05/27/2021 1600 Gross per 24 hour  Intake 419.52 ml  Output 2465 ml  Net -2045.48 ml   Filed Weights   05/26/21 1402 05/26/21 1815 05/27/21 0500  Weight: 69.5 kg 67.5 kg 68.1 kg    Examination:  General exam: alert, awake, communicative,calm , legally blind, able to see colors and shadows  Respiratory system: diminished at basis, no wheezing, no rales, no rhonchi. Respiratory effort  normal. Cardiovascular system:  IRRR.  Gastrointestinal system: Abdomen post op changes, quiet abdomen, no significant tenderness, dressing intact, drain to left ab. Central nervous system: Alert and oriented.  Extremities:  no edema Skin: No rashes, lesions or ulcers Psychiatry: Judgement and insight appear normal. Mood & affect appropriate.     Data Reviewed: I have personally reviewed following labs and imaging studies  CBC: Recent Labs  Lab 05/21/21 0248 05/21/21 1540 05/22/21 0252 05/23/21 0131  05/24/21 0400 05/24/21 1541 05/24/21 1855 05/25/21 0609 05/25/21 1335 05/26/21 0508 05/27/21 0600  WBC 7.7  --  7.2 9.0 8.8  --  5.6 11.3*  --  14.5* 12.5*  NEUTROABS 5.4  --  6.0 6.6 6.8  --   --   --   --   --   --   HGB 7.1*   < > 7.6* 7.8* 7.5*   < > 7.9* 6.9* 8.7* 8.1* 8.0*  HCT 20.2*   < > 21.0* 22.5* 21.9*   < > 22.6* 19.0* 24.4* 23.0* 22.8*  MCV 99.5  --  96.8 100.0 100.0  --  93.0 93.1  --  92.7 91.9  PLT 172  --  176 193 174  --  145* 113*  --  122* 120*   < > = values in this interval not displayed.    Basic Metabolic Panel: Recent Labs  Lab 05/22/21 0252 05/23/21 0131 05/24/21 0400 05/24/21 1541 05/24/21 1756 05/24/21 1855 05/25/21 0609 05/26/21 0508 05/27/21 0751  NA 135 136 138 138 139 137 134* 134* 134*  K 3.9 3.9 3.9 3.5 3.4* 3.3* 3.6 3.3* 3.6  CL 95* 95* 95* 97*  --  100 98 96* 100  CO2 27 27 29   --   --  26 25 24 27   GLUCOSE 103* 160* 108* 129*  --  140* 210* 102* 116*  BUN 59* 75* 81* 21  --  21 30* 47* 29*  CREATININE 4.85* 5.89* 7.06* 2.70*  --  2.89* 3.69* 4.71* 3.44*  CALCIUM 8.2* 8.6* 8.6*  --   --  7.7* 8.0* 8.3* 8.6*  MG 2.2 2.4 2.6*  --   --   --  1.9 2.1  --   PHOS 5.8* 5.3* 6.2*  --   --   --  4.1 4.1  --     GFR: Estimated Creatinine Clearance: 21.2 mL/min (A) (by C-G formula based on SCr of 3.44 mg/dL (H)).  Liver Function Tests: Recent Labs  Lab 05/21/21 0248 05/22/21 0252 05/23/21 0131 05/24/21 0400 05/25/21 0609  AST  35 27 33 35 32  ALT 18 17 16 18 16   ALKPHOS 140* 152* 168* 163* 110  BILITOT 3.6* 2.1* 2.0* 2.3* 1.5*  PROT 5.3* 5.1* 5.9* 6.0* 4.8*  ALBUMIN 2.2* 2.1* 2.4* 2.4* 1.9*    CBG: Recent Labs  Lab 05/26/21 0832 05/26/21 1156 05/26/21 2105 05/27/21 0613 05/27/21 0751  GLUCAP 131* 95 114* 116* 117*     Recent Results (from the past 240 hour(s))  Resp Panel by RT-PCR (Flu A&B, Covid) Nasopharyngeal Swab     Status: None   Collection Time: 05/19/21  6:29 PM   Specimen: Nasopharyngeal Swab; Nasopharyngeal(NP) swabs in vial transport medium  Result Value Ref Range Status   SARS Coronavirus 2 by RT PCR NEGATIVE NEGATIVE Final    Comment: (NOTE) SARS-CoV-2 target nucleic acids are NOT DETECTED.  The SARS-CoV-2 RNA is generally detectable in upper respiratory specimens during the acute phase of infection. The lowest concentration of SARS-CoV-2 viral copies this assay can detect is 138 copies/mL. A negative result does not preclude SARS-Cov-2 infection and should not be used as the sole basis for treatment or other patient management decisions. A negative result may occur with  improper specimen collection/handling, submission of specimen other than nasopharyngeal swab, presence of viral mutation(s) within the areas targeted by this assay, and inadequate number of viral copies(<138 copies/mL). A negative result must be combined with clinical observations, patient history, and  epidemiological information. The expected result is Negative.  Fact Sheet for Patients:  EntrepreneurPulse.com.au  Fact Sheet for Healthcare Providers:  IncredibleEmployment.be  This test is no t yet approved or cleared by the [place] FDA and  has been authorized for detection and/or diagnosis of SARS-CoV-2 by FDA under an Emergency Use Authorization (EUA). This EUA will remain  in effect (meaning this test can be used) for the duration of the COVID-19 declaration  under Section 564(b)(1) of the Act, 21 U.S.C.section 360bbb-3(b)(1), unless the authorization is terminated  or revoked sooner.       Influenza A by PCR NEGATIVE NEGATIVE Final   Influenza B by PCR NEGATIVE NEGATIVE Final    Comment: (NOTE) The Xpert Xpress SARS-CoV-2/FLU/RSV plus assay is intended as an aid in the diagnosis of influenza from Nasopharyngeal swab specimens and should not be used as a sole basis for treatment. Nasal washings and aspirates are unacceptable for Xpert Xpress SARS-CoV-2/FLU/RSV testing.  Fact Sheet for Patients: EntrepreneurPulse.com.au  Fact Sheet for Healthcare Providers: IncredibleEmployment.be  This test is not yet approved or cleared by the [place] FDA and has been authorized for detection and/or diagnosis of SARS-CoV-2 by FDA under an Emergency Use Authorization (EUA). This EUA will remain in effect (meaning this test can be used) for the duration of the COVID-19 declaration under Section 564(b)(1) of the Act, 21 U.S.C. section 360bbb-3(b)(1), unless the authorization is terminated or revoked.  Performed at [facility], [place] [address] [place], [place] [address]   MRSA Next Gen by PCR, Nasal     Status: None   Collection Time: 05/24/21  5:54 PM   Specimen: Nasal Mucosa; Nasal Swab  Result Value Ref Range Status   MRSA by PCR Next Gen NOT DETECTED NOT DETECTED Final    Comment: (NOTE) The GeneXpert MRSA Assay (FDA approved for NASAL specimens only), is one component of a comprehensive MRSA colonization surveillance program. It is not intended to diagnose MRSA infection nor to guide or monitor treatment for MRSA infections. Test performance is not FDA approved in patients less than 33 years old. Performed at [facility], [place] [address], [place], [place] [address]          Radiology Studies: No results found.      Scheduled Meds:  arformoterol  15 mcg Nebulization BID    budesonide (PULMICORT) nebulizer solution  0.25 mg Nebulization BID   Chlorhexidine Gluconate Cloth  6 each Topical Daily   darbepoetin (ARANESP) injection - DIALYSIS  200 mcg Intravenous Q Sat-HD   HYDROmorphone   Intravenous Q4H   mouth rinse  15 mL Mouth Rinse BID   pantoprazole (PROTONIX) IV  40 mg Intravenous Q12H   revefenacin  175 mcg Nebulization Daily   sodium chloride flush  10-40 mL Intracatheter Q12H   Continuous Infusions:  amiodarone Stopped (05/25/21 1553)   methocarbamol (ROBAXIN) IV     piperacillin-tazobactam (ZOSYN)  IV 100 mL/hr at 05/27/21 1200   TPN ADULT (ION) 65 mL/hr at 05/26/21 1840   TPN ADULT (ION)       LOS: 8 days   Time spent: 57mins Greater than 50% of this time was spent in counseling, explanation of diagnosis, planning of further management, and coordination of care.   Voice Recognition Viviann Spare dictation system was used to create this note, attempts have been made to correct errors. Please contact the author with questions and/or clarifications.   Florencia Reasons, MD PhD FACP [facility]  Available via Epic  secure chat 7am-7pm for nonurgent issues Please page for urgent issues To page the attending provider between 7A-7P or the covering provider during after hours 7P-7A, please log into the web site [email or web address] and access using universal [place] password for that web site. If you do not have the password, please call the hospital operator.    05/27/2021, 4:51 PM

## 2021-05-28 ENCOUNTER — Inpatient Hospital Stay (HOSPITAL_COMMUNITY): Payer: HMO

## 2021-05-28 DIAGNOSIS — K810 Acute cholecystitis: Secondary | ICD-10-CM | POA: Diagnosis not present

## 2021-05-28 DIAGNOSIS — K559 Vascular disorder of intestine, unspecified: Secondary | ICD-10-CM | POA: Diagnosis not present

## 2021-05-28 DIAGNOSIS — I5022 Chronic systolic (congestive) heart failure: Secondary | ICD-10-CM | POA: Diagnosis not present

## 2021-05-28 DIAGNOSIS — N186 End stage renal disease: Secondary | ICD-10-CM | POA: Diagnosis not present

## 2021-05-28 DIAGNOSIS — R1084 Generalized abdominal pain: Secondary | ICD-10-CM | POA: Diagnosis not present

## 2021-05-28 DIAGNOSIS — I4821 Permanent atrial fibrillation: Secondary | ICD-10-CM

## 2021-05-28 DIAGNOSIS — D62 Acute posthemorrhagic anemia: Secondary | ICD-10-CM | POA: Diagnosis not present

## 2021-05-28 DIAGNOSIS — K56609 Unspecified intestinal obstruction, unspecified as to partial versus complete obstruction: Secondary | ICD-10-CM | POA: Diagnosis not present

## 2021-05-28 LAB — GLUCOSE, CAPILLARY
Glucose-Capillary: 111 mg/dL — ABNORMAL HIGH (ref 70–99)
Glucose-Capillary: 120 mg/dL — ABNORMAL HIGH (ref 70–99)
Glucose-Capillary: 120 mg/dL — ABNORMAL HIGH (ref 70–99)
Glucose-Capillary: 133 mg/dL — ABNORMAL HIGH (ref 70–99)
Glucose-Capillary: 134 mg/dL — ABNORMAL HIGH (ref 70–99)

## 2021-05-28 LAB — BASIC METABOLIC PANEL
Anion gap: 12 (ref 5–15)
Anion gap: 9 (ref 5–15)
BUN: 48 mg/dL — ABNORMAL HIGH (ref 8–23)
BUN: 60 mg/dL — ABNORMAL HIGH (ref 8–23)
CO2: 21 mmol/L — ABNORMAL LOW (ref 22–32)
CO2: 24 mmol/L (ref 22–32)
Calcium: 8.4 mg/dL — ABNORMAL LOW (ref 8.9–10.3)
Calcium: 8.2 mg/dL — ABNORMAL LOW (ref 8.9–10.3)
Chloride: 101 mmol/L (ref 98–111)
Chloride: 104 mmol/L (ref 98–111)
Creatinine, Ser: 4.73 mg/dL — ABNORMAL HIGH (ref 0.61–1.24)
Creatinine, Ser: 5.35 mg/dL — ABNORMAL HIGH (ref 0.61–1.24)
GFR, Estimated: 13 mL/min — ABNORMAL LOW (ref >60)
GFR, Estimated: 11 mL/min — ABNORMAL LOW (ref >60)
Glucose, Bld: 115 mg/dL — ABNORMAL HIGH (ref 70–99)
Glucose, Bld: 126 mg/dL — ABNORMAL HIGH (ref 70–99)
Potassium: 4.8 mmol/L (ref 3.5–5.1)
Potassium: 3.6 mmol/L (ref 3.5–5.1)
Sodium: 134 mmol/L — ABNORMAL LOW (ref 135–145)
Sodium: 137 mmol/L (ref 135–145)

## 2021-05-28 LAB — CBC
HCT: 19 % — ABNORMAL LOW (ref 39.0–52.0)
HCT: 18.7 % — ABNORMAL LOW (ref 39.0–52.0)
Hemoglobin: 6.6 g/dL — CL (ref 13.0–17.0)
Hemoglobin: 6.6 g/dL — CL (ref 13.0–17.0)
MCH: 32.7 pg (ref 26.0–34.0)
MCH: 31.7 pg (ref 26.0–34.0)
MCHC: 34.7 g/dL (ref 30.0–36.0)
MCHC: 35.3 g/dL (ref 30.0–36.0)
MCV: 94.1 fL (ref 80.0–100.0)
MCV: 89.9 fL (ref 80.0–100.0)
Platelets: 109 10*3/uL — ABNORMAL LOW (ref 150–400)
Platelets: 92 10*3/uL — ABNORMAL LOW (ref 150–400)
RBC: 2.02 MIL/uL — ABNORMAL LOW (ref 4.22–5.81)
RBC: 2.08 MIL/uL — ABNORMAL LOW (ref 4.22–5.81)
RDW: 22.2 % — ABNORMAL HIGH (ref 11.5–15.5)
RDW: 20.3 % — ABNORMAL HIGH (ref 11.5–15.5)
WBC: 11.7 10*3/uL — ABNORMAL HIGH (ref 4.0–10.5)
WBC: 13.2 10*3/uL — ABNORMAL HIGH (ref 4.0–10.5)
nRBC: 1 % — ABNORMAL HIGH (ref 0.0–0.2)
nRBC: 0.7 % — ABNORMAL HIGH (ref 0.0–0.2)

## 2021-05-28 LAB — CBC WITH DIFFERENTIAL/PLATELET
Abs Immature Granulocytes: 0.42 10*3/uL — ABNORMAL HIGH (ref 0.00–0.07)
Basophils Absolute: 0.1 10*3/uL (ref 0.0–0.1)
Basophils Relative: 1 %
Eosinophils Absolute: 0.3 10*3/uL (ref 0.0–0.5)
Eosinophils Relative: 3 %
HCT: 18.7 % — ABNORMAL LOW (ref 39.0–52.0)
Hemoglobin: 6.6 g/dL — CL (ref 13.0–17.0)
Immature Granulocytes: 3 %
Lymphocytes Relative: 3 %
Lymphs Abs: 0.4 10*3/uL — ABNORMAL LOW (ref 0.7–4.0)
MCH: 32 pg (ref 26.0–34.0)
MCHC: 35.3 g/dL (ref 30.0–36.0)
MCV: 90.8 fL (ref 80.0–100.0)
Monocytes Absolute: 1.4 10*3/uL — ABNORMAL HIGH (ref 0.1–1.0)
Monocytes Relative: 11 %
Neutro Abs: 10.3 10*3/uL — ABNORMAL HIGH (ref 1.7–7.7)
Neutrophils Relative %: 79 %
Platelets: 93 10*3/uL — ABNORMAL LOW (ref 150–400)
RBC: 2.06 MIL/uL — ABNORMAL LOW (ref 4.22–5.81)
RDW: 20.3 % — ABNORMAL HIGH (ref 11.5–15.5)
WBC: 13 10*3/uL — ABNORMAL HIGH (ref 4.0–10.5)
nRBC: 0.8 % — ABNORMAL HIGH (ref 0.0–0.2)

## 2021-05-28 LAB — HEPATIC FUNCTION PANEL
ALT: 5 U/L (ref 0–44)
AST: 16 U/L (ref 15–41)
Albumin: 1.6 g/dL — ABNORMAL LOW (ref 3.5–5.0)
Alkaline Phosphatase: 88 U/L (ref 38–126)
Bilirubin, Direct: 0.5 mg/dL — ABNORMAL HIGH (ref 0.0–0.2)
Indirect Bilirubin: 0.9 mg/dL (ref 0.3–0.9)
Total Bilirubin: 1.4 mg/dL — ABNORMAL HIGH (ref 0.3–1.2)
Total Protein: 4.7 g/dL — ABNORMAL LOW (ref 6.5–8.1)

## 2021-05-28 LAB — LACTIC ACID, PLASMA: Lactic Acid, Venous: 0.7 mmol/L (ref 0.5–1.9)

## 2021-05-28 LAB — PREPARE RBC (CROSSMATCH)

## 2021-05-28 LAB — MAGNESIUM: Magnesium: 1.9 mg/dL (ref 1.7–2.4)

## 2021-05-28 MED ORDER — TRACE MINERALS CU-MN-SE-ZN 300-55-60-3000 MCG/ML IV SOLN
INTRAVENOUS | Status: AC
  Filled 2021-05-28: qty 676

## 2021-05-28 MED ORDER — SODIUM CHLORIDE 0.9% IV SOLUTION: Freq: Once | INTRAVENOUS | Status: AC

## 2021-05-28 MED ORDER — SODIUM CHLORIDE 0.9 % IV SOLN
20.0000 ug | Freq: Once | INTRAVENOUS | Status: AC
  Administered 2021-05-28: 20 ug via INTRAVENOUS
  Filled 2021-05-28: qty 5

## 2021-05-28 MED ORDER — PANTOPRAZOLE SODIUM 40 MG IV SOLR: 40.0000 mg | Freq: Two times a day (BID) | INTRAVENOUS | Status: DC

## 2021-05-28 MED ORDER — PANTOPRAZOLE INFUSION (NEW) - SIMPLE MED
8.0000 mg/h | INTRAVENOUS | Status: AC
  Administered 2021-05-28 – 2021-05-29 (×3): 8 mg/h via INTRAVENOUS
  Filled 2021-05-28 (×2): qty 80
  Filled 2021-05-28: qty 100
  Filled 2021-05-28: qty 80

## 2021-05-28 NOTE — Consult Note (Addendum)
NAME:  Patrick [name], MRN:  [id number], DOB:  November 17, 1955, LOS: 9 ADMISSION DATE:  05/19/2021, CONSULTATION DATE:  12/7 REFERRING MD:  Hal Hope,  Reason for Consult: Blood from OGT  History of Present Illness:  Patient is a 65 year old male with pertinent PMH ESRD (dialysis Tuesday, Thursday, Saturday), chronic systolic CHF, COPD, pulm hypertension, sickle cell anemia, chronic A. fib on Eliquis, SBO, choledocholithiasis presents to [facility] on 12/2 with recurrent SBO.  On 05/09/2021, patient was hospitalized at [place] with SBO and choledocholithiasis.  SBO resolved with conservative measures and choledocholithiasis improved with ERCP and stent placement.  Patient's abdominal pain continue to worsen and was admitted to [facility] on 12/2 which showed recurrent SBO and choledocholithiasis.  Patient hemoglobin low and stopped home Eliquis.  NG tube placed and made NPO.  On 12/7 patient received surgery for SBO and choledocholithiasis.  Patient received ex lap, surgical resection of SBO and cholecystectomy.  Patient remained on vaso and remained intubated postprocedure.  He was extubated on 12/8 and transferred to [facility] on 12/9.  On 12/11 the patient was noted to have persistent bloody drainge from the NGT, with the patient reportedly coughing up some blood. Dr. Hal Hope spoke with Dr. Lanny Hurst (GS) who ordered DDAVP and Dr. Rush Landmark, the on-call gastroenterologist who recommended starting the patient on a protonix infusion with plans to see for EGD. Infusion was started. Hemoglobin resulted at 6.6. 1 U PRBC was ordered.   PCCM was consulted for transfer and management     Pertinent  Medical History  Sickle cell anemia, afib on eliquis, combined CHF, ESRF on HD TTS, recently hospitalized in [place] for Choledocholithiasis w recent ERCP/Sphinct/CBD and PD stenting/stone extraction (declied cholecystectomy)  Significant Hospital Events: Including procedures, antibiotic start and stop  dates in addition to other pertinent events   12/2: admitted to [facility] for bowel obstruction 12/06:  ECHO EF 40-45%, RV mildly reduced, RVSP 55 mmHg, severe MR, severe TR 12/7: surgery bowel resection and drain placement; arrived to ICU intubated post procedure 12/11 PCCM consult. 1 U pRBC  Interim History / Subjective:  PCCM reconsult  Subjective: Denies SOB, denies chest pain, pain controlled with PCA  Objective   Blood pressure 114/62, pulse 98, temperature 98.1 F (36.7 C), resp. rate 20, height 5\' 11"  (1.803 m), weight 67.9 kg, SpO2 100 %.    FiO2 (%):  [21 %] 21 %   Intake/Output Summary (Last 24 hours) at 05/28/2021 2326 Last data filed at 05/28/2021 2045 Gross per 24 hour  Intake 1090.25 ml  Output 1100 ml  Net -9.75 ml   Filed Weights   05/26/21 1815 05/27/21 0500 05/27/21 2243  Weight: 67.5 kg 68.1 kg 67.9 kg    Examination: General: In bed, NAD, appears comfortable HEENT: MM pink/moist, anicteric, atraumatic Neuro: RASS 0, PERRL 77mm, GCS 15 CV: S1S2, Afib, no m/r/g appreciated PULM:  clear in the upper lobes, clear in the lower lobes, trachea midline, chest expansion symmetric GI: distended, bsx4 hypoactive, NGT in place with bloody output and some dark clots. JP drain with scant blood. Extremities: warm/dry, no pretibial edema, capillary refill less than 3 seconds  Skin: Midline dressing CDI,  no rashes or lesions noted  WBC 11.7>13.2 HGB 6.6 PLT 92 Lactic 0.7 Bili 1.4 BUN 48>60  CXR 12/11 improved compared to prior film. No pneumo or effusion KUB 12/11 Dialated small bowel loops, no definite free air. Resolved Hospital Problem list   Acute respiratory failure Hypotension  Assessment & Plan:   SBO: s/p  ex lap and surgical resection 12/7. POD 4 Gangrenous cholecystitis: 12/7 cholecystectomy; recent Choledocholithiasis s/p recent ERCP stent placement at [facility] Chronic small bowel ischemia Post op-ileus Bleeding from NGT Tmax 98.7, WBC 11.7>13.2  suspect secondary to surgery, normotensive. Unclear source of bleeding. Does not appear symptomatic at this time. No lactic elevation. No hypotension. P: -Transfer to ICU. VS per protocol.  -Make NPO. -Surgery following. Ordered DDAVP per hospitalist. Gertie Fey consulted by hospitalist. To see patient. Follow up recs -Continue protonix GTT  -Continue Zosyn.  -Continue TPN -Continue NGT to LIS -Midline wound per GS -Follow up hgb post transfusion  ESRD: on HD tues, thurs, sat Off schedule per neph. Next treatment 12/12 P: -Nephrology following -Ensure renal perfusion. Goal MAP 65 or greater. -Avoid neprotoxic drugs as possible. -Strict I&O's -Follow up AM creatinine -Follow up MG phos  ABLA on chronic Anemia: sickle cell and CKD Thrombocytopenia HBG 6.6. PLT 92, secondary to bleeding P: - 1 U PRBC ordered -Transfuse PRBC if HBG less than 7 -Obtain AM CBC to trend H&H -Monitor for signs of bleeding. Monitor NGT output  HX COPD: takes incruse and ellipta at home P: -Continue brovana, yupelri, and pulmicort -Pulm toilet as able -Goal O2 sat 88-96. Wean o2 to goal  Chronic systolic CHF: echo 5397 EF of 40 to 45% with mild right ventricular dysfunction and severe biatrial enlargement and severe mitral regurg and tricuspid regurg Afib RVR: on eliquis at home- rate controlled Pulm HTN P: -Continue holding eliquis due to recent surgery and suspected active bleeding -daily weights -HD per neph -HF following -Hydralazine for HTN -Rate controlled off amio.  Sickle cell disease P: -Continue PCA  Protein calorie malnutrition P: -Continue TPN   Best Practice (right click and "Reselect all SmartList Selections" daily)   Diet/type: NPO DVT prophylaxis: SCD GI prophylaxis: PPI Lines: N/A Foley:  Yes, and it is still needed Code Status:  full code Last date of multidisciplinary goals of care discussion [pending]  Labs   CBC: Recent Labs  Lab 05/22/21 0252  05/23/21 0131 05/24/21 0400 05/24/21 1541 05/26/21 0508 05/27/21 0600 05/28/21 1200 05/28/21 2000 05/28/21 2031  WBC 7.2 9.0 8.8   < > 14.5* 12.5* 11.7* 13.2* 13.0*  NEUTROABS 6.0 6.6 6.8  --   --   --   --   --  10.3*  HGB 7.6* 7.8* 7.5*   < > 8.1* 8.0* 6.6* 6.6* 6.6*  HCT 21.0* 22.5* 21.9*   < > 23.0* 22.8* 19.0* 18.7* 18.7*  MCV 96.8 100.0 100.0   < > 92.7 91.9 94.1 89.9 90.8  PLT 176 193 174   < > 122* 120* 109* 92* 93*   < > = values in this interval not displayed.    Basic Metabolic Panel: Recent Labs  Lab 05/22/21 0252 05/23/21 0131 05/24/21 0400 05/24/21 1541 05/25/21 0609 05/26/21 0508 05/27/21 0751 05/28/21 0655 05/28/21 2031  NA 135 136 138   < > 134* 134* 134* 134* 137  K 3.9 3.9 3.9   < > 3.6 3.3* 3.6 4.8 3.6  CL 95* 95* 95*   < > 98 96* 100 101 104  CO2 27 27 29    < > 25 24 27  21* 24  GLUCOSE 103* 160* 108*   < > 210* 102* 116* 115* 126*  BUN 59* 75* 81*   < > 30* 47* 29* 48* 60*  CREATININE 4.85* 5.89* 7.06*   < > 3.69* 4.71* 3.44* 4.73* 5.35*  CALCIUM 8.2* 8.6* 8.6*   < >  8.0* 8.3* 8.6* 8.4* 8.2*  MG 2.2 2.4 2.6*  --  1.9 2.1  --   --  1.9  PHOS 5.8* 5.3* 6.2*  --  4.1 4.1  --   --   --    < > = values in this interval not displayed.   GFR: Estimated Creatinine Clearance: 13.6 mL/min (A) (by C-G formula based on SCr of 5.35 mg/dL (H)). Recent Labs  Lab 05/27/21 0600 05/28/21 1200 05/28/21 2000 05/28/21 2031  WBC 12.5* 11.7* 13.2* 13.0*  LATICACIDVEN  --   --   --  0.7    Liver Function Tests: Recent Labs  Lab 05/22/21 0252 05/23/21 0131 05/24/21 0400 05/25/21 0609 05/28/21 2031  AST 27 33 35 32 16  ALT 17 16 18 16 5   ALKPHOS 152* 168* 163* 110 88  BILITOT 2.1* 2.0* 2.3* 1.5* 1.4*  PROT 5.1* 5.9* 6.0* 4.8* 4.7*  ALBUMIN 2.1* 2.4* 2.4* 1.9* 1.6*   No results for input(s): LIPASE, AMYLASE in the last 168 hours.  No results for input(s): AMMONIA in the last 168 hours.  ABG    Component Value Date/Time   PHART 7.523 (H) 05/24/2021  1756   PCO2ART 40.1 05/24/2021 1756   PO2ART 571 (H) 05/24/2021 1756   HCO3 33.0 (H) 05/24/2021 1756   TCO2 34 (H) 05/24/2021 1756   ACIDBASEDEF 9.0 (H) 06/11/2011 0958   O2SAT 100.0 05/24/2021 1756     Coagulation Profile: Recent Labs  Lab 05/24/21 1855  INR 1.2    Cardiac Enzymes: No results for input(s): CKTOTAL, CKMB, CKMBINDEX, TROPONINI in the last 168 hours.  HbA1C: Hgb A1c MFr Bld  Date/Time Value Ref Range Status  05/25/2021 01:35 PM 4.9 4.8 - 5.6 % Final    Comment:    (NOTE) **Verified by repeat analysis**         Prediabetes: 5.7 - 6.4         Diabetes: >6.4         Glycemic control for adults with diabetes: <7.0   06/07/2011 04:03 PM <4.0 <5.7 % Final    Comment:    (NOTE)                                                                       According to the ADA Clinical Practice Recommendations for 2011, when HbA1c is used as a screening test:  >=6.5%   Diagnostic of Diabetes Mellitus           (if abnormal result is confirmed) 5.7-6.4%   Increased risk of developing Diabetes Mellitus References:Diagnosis and Classification of Diabetes Mellitus,Diabetes LFYB,0175,10(CHENI 1):S62-S69 and Standards of Medical Care in         Diabetes - 2011,Diabetes DPOE,4235,36 (Suppl 1):S11-S61. Result repeated and verified. Suggest recollect if clinically inducated.    CBG: Recent Labs  Lab 05/27/21 0751 05/28/21 0817 05/28/21 1306 05/28/21 1657 05/28/21 2031  GLUCAP 117* 111* 120* 120* 133*    Review of Systems:   Positives in bold  Gen: fever, chills, weight change, fatigue, night sweats HEENT:  blurred vision, double vision, hearing loss, tinnitus, sinus congestion, rhinorrhea, sore throat, neck stiffness, dysphagia PULM:  shortness of breath, cough, sputum production, hemoptysis, wheezing CV: chest pain, edema, orthopnea, paroxysmal nocturnal dyspnea,  palpitations GI:  abdominal pain, nausea, vomiting, diarrhea, hematochezia, melena, constipation,  change in bowel habits, blood from NGT GU: dysuria, hematuria, polyuria, oliguria, urethral discharge Endocrine: hot or cold intolerance, polyuria, polyphagia or appetite change Derm: rash, dry skin, scaling or peeling skin change Heme: easy bruising, bleeding, bleeding gums Neuro: headache, numbness, weakness, slurred speech, loss of memory or consciousness   Past Medical History:  He,  has a past medical history of Blood dyscrasia, Blood transfusion, CHF (congestive heart failure) ([place]), Chronic kidney disease (CKD), stage III (moderate) ([place]), Dialysis patient ([place]) (04/2019), Gout, Headache(784.0), Legally blind, Shortness of breath dyspnea, Sickle cell disease, type SS ([place]), Swelling abdomen, Swelling of extremity, left, and Swelling of extremity, right.   Surgical History:   Past Surgical History:  Procedure Laterality Date   A/V FISTULAGRAM Right 07/29/2020   Procedure: A/V FISTULAGRAM - Right Arm;  Surgeon: Angelia Mould, MD;  Location: [place] CV LAB;  Service: Cardiovascular;  Laterality: Right;   BASCILIC VEIN TRANSPOSITION Right 04/12/2014   Procedure: BASILIC VEIN TRANSPOSITION;  Surgeon: Elam Dutch, MD;  Location: [place];  Service: Vascular;  Laterality: Right;   BIOPSY  08/04/2020   Procedure: BIOPSY;  Surgeon: Ladene Artist, MD;  Location: [facility] ENDOSCOPY;  Service: Gastroenterology;;   BOWEL RESECTION N/A 05/24/2021   Procedure: SMALL BOWEL RESECTION;  Surgeon: Rolm Bookbinder, MD;  Location: [place];  Service: General;  Laterality: N/A;   CARDIAC CATHETERIZATION  05/2011   CARDIOVERSION N/A 06/05/2018   Procedure: CARDIOVERSION;  Surgeon: Jolaine Artist, MD;  Location: [place];  Service: Cardiovascular;  Laterality: N/A;   CARDIOVERSION N/A 10/06/2019   Procedure: CARDIOVERSION;  Surgeon: Jolaine Artist, MD;  Location: [facility] ENDOSCOPY;  Service: Cardiovascular;  Laterality: N/A;   CHOLECYSTECTOMY N/A 05/24/2021   Procedure: OPEN  CHOLECYSTECTOMY;  Surgeon: Rolm Bookbinder, MD;  Location: [place];  Service: General;  Laterality: N/A;   COLONOSCOPY WITH PROPOFOL N/A 09/24/2017   Procedure: COLONOSCOPY WITH PROPOFOL;  Surgeon: Jackquline Denmark, MD;  Location: [facility] ENDOSCOPY;  Service: Endoscopy;  Laterality: N/A;   ESOPHAGOGASTRODUODENOSCOPY N/A 09/19/2017   Procedure: ESOPHAGOGASTRODUODENOSCOPY (EGD);  Surgeon: Jackquline Denmark, MD;  Location: [facility] ENDOSCOPY;  Service: Endoscopy;  Laterality: N/A;   ESOPHAGOGASTRODUODENOSCOPY (EGD) WITH PROPOFOL N/A 08/04/2020   Procedure: ESOPHAGOGASTRODUODENOSCOPY (EGD) WITH PROPOFOL;  Surgeon: Ladene Artist, MD;  Location: [place];  Service: Gastroenterology;  Laterality: N/A;   IR PERC TUN PERIT CATH WO PORT S&I /IMAG  05/23/2021   IR US GUIDE VASC ACCESS RIGHT  05/23/2021   LAPAROSCOPY N/A 05/24/2021   Procedure: LAPAROSCOPY DIAGNOSTIC;  Surgeon: Rolm Bookbinder, MD;  Location: [place];  Service: General;  Laterality: N/A;   LAPAROTOMY N/A 05/24/2021   Procedure: EXPLORATORY LAPAROTOMY;  Surgeon: Rolm Bookbinder, MD;  Location: [place];  Service: General;  Laterality: N/A;   LEFT AND RIGHT HEART CATHETERIZATION WITH CORONARY ANGIOGRAM N/A 06/11/2011   Procedure: LEFT AND RIGHT HEART CATHETERIZATION WITH CORONARY ANGIOGRAM;  Surgeon: Larey Dresser, MD;  Location: [facility] CATH LAB;  Service: Cardiovascular;  Laterality: N/A;   PERIPHERAL VASCULAR BALLOON ANGIOPLASTY Right 07/29/2020   Procedure: PERIPHERAL VASCULAR BALLOON ANGIOPLASTY;  Surgeon: Angelia Mould, MD;  Location: [place] CV LAB;  Service: Cardiovascular;  Laterality: Right;  arm fistula   REVISON OF ARTERIOVENOUS FISTULA Right 08/08/2020   Procedure: ANEURYSM EXCISION OF RIGHT UPPER EXTREMITY ARTERIOVENOUS FISTULA;  Surgeon: Angelia Mould, MD;  Location: [place];  Service: Vascular;  Laterality: Right;   TEE WITHOUT CARDIOVERSION  N/A 12/27/2015   Procedure: TRANSESOPHAGEAL ECHOCARDIOGRAM (TEE);  Surgeon: Sueanne Margarita,  MD;  Location: [facility] ENDOSCOPY;  Service: Cardiovascular;  Laterality: N/A;     Social History:   reports that he quit smoking about 5 years ago. His smoking use included cigarettes. He has a 10.25 pack-year smoking history. He has never used smokeless tobacco. He reports that he does not currently use alcohol after a past usage of about 3.0 standard drinks per week. He reports that he does not use drugs.   Family History:  His family history includes Alcohol abuse in his mother; Cirrhosis in his mother; Heart attack in his mother; Liver disease in his mother.   Allergies No Known Allergies   Home Medications  Prior to Admission medications   Medication Sig Start Date End Date Taking? Authorizing Provider  acetaminophen (TYLENOL) 500 MG tablet Take 1,000 mg by mouth every 6 (six) hours as needed for moderate pain or headache.   Yes [provider]  allopurinol (ZYLOPRIM) 100 MG tablet TAKE 1 TABLET BY MOUTH EVERY DAY 05/17/21  Yes Dorena Dew, FNP  AURYXIA 1 GM 210 MG(Fe) tablet Take 420 mg by mouth 3 (three) times daily. 02/11/21  Yes [provider]  diphenhydramine-acetaminophen (TYLENOL PM) 25-500 MG TABS tablet Take 2 tablets by mouth at bedtime as needed (sleep).   Yes [provider]  ELIQUIS 5 MG TABS tablet TAKE 1 TABLET BY MOUTH TWICE A DAY 02/21/21  Yes Bensimhon, Shaune Pascal, MD  folic acid (FOLVITE) 1 MG tablet TAKE 1 TABLET BY MOUTH EVERY DAY 02/22/21  Yes Dorena Dew, FNP  gabapentin (NEURONTIN) 100 MG capsule TAKE 1 CAPSULE BY MOUTH EVERYDAY AT BEDTIME 12/12/20  Yes Dorena Dew, FNP  hydrALAZINE (APRESOLINE) 25 MG tablet Take 1 tablet (25 mg total) by mouth in the morning and at bedtime. DO NOT TAKE AM DOSES ON DIALYSIS DAYS Patient taking differently: Take 25 mg by mouth See admin instructions. Take 1 tablet by mouth twice daily all days, EXCEPT on dialysis day (T, Th, Sat) take in the evening only 01/27/20 05/19/21 Yes Bensimhon, Shaune Pascal, MD   hydroxyurea (HYDREA) 500 MG capsule Take 1 capsule (500 mg total) by mouth daily. TAKE 1 CAPSULE BY MOUTH EVERY DAY WITH FOOD TO MINIMIZE GI SIDE EFFECTS 01/31/21  Yes Dorena Dew, FNP  isosorbide mononitrate (IMDUR) 30 MG 24 hr tablet Take 1 tablet (30 mg total) by mouth daily. DO NOT TAKE AM ON DIALYSIS DAYS. 08/25/20  Yes Bensimhon, Shaune Pascal, MD  lidocaine-prilocaine (EMLA) cream Apply 1 application topically daily as needed (port access).  09/08/19  Yes [provider]  metolazone (ZAROXOLYN) 5 MG tablet Take 1 tablet (5 mg total) by mouth daily as needed. Patient taking differently: Take 5 mg by mouth daily as needed (fluid). 05/23/20  Yes Bensimhon, Shaune Pascal, MD  Oxycodone HCl 10 MG TABS Take 1 tablet (10 mg total) by mouth every 4 (four) hours as needed (pain). 05/17/21  Yes Dorena Dew, FNP  tiZANidine (ZANAFLEX) 4 MG tablet TAKE 0.5 TABLET BY MOUTH EVERY 8 HOURS AS NEEDED FOR MUSCLE SPASMS Patient taking differently: Take 2 mg by mouth every 8 (eight) hours as needed for muscle spasms. 02/22/21  Yes Dorena Dew, FNP  torsemide (DEMADEX) 20 MG tablet Take 80 mg by mouth daily. 03/31/20  Yes [provider]  Vitamin D, Ergocalciferol, (DRISDOL) 1.25 MG (50000 UNIT) CAPS capsule TAKE 1 CAPSULE BY MOUTH ONE TIME PER WEEK  Patient taking differently: Take 50,000 Units by mouth every Saturday. 05/15/21  Yes Dorena Dew, FNP  albuterol (VENTOLIN HFA) 108 (90 Base) MCG/ACT inhaler Inhale 2 puffs into the lungs every 6 (six) hours as needed for wheezing or shortness of breath. Patient not taking: Reported on 05/19/2021 05/09/20   Freddi Starr, MD  Budeson-Glycopyrrol-Formoterol (BREZTRI AEROSPHERE) 160-9-4.8 MCG/ACT AERO Inhale 2 puffs into the lungs in the morning and at bedtime. Patient not taking: Reported on 05/19/2021 12/21/20   Freddi Starr, MD  carvedilol (COREG) 12.5 MG tablet Take 1 tablet (12.5 mg total) by mouth 2 (two) times daily with a  meal. Patient not taking: Reported on 05/19/2021 05/16/21   Bensimhon, Shaune Pascal, MD     Critical care time: 79 minutes    [employer] MSN, APRN, AGACNP-BC  Pulmonary & Critical Care  05/28/2021 , 11:26 PM  Please see [email or web address] for pager details  If no response, please call [phone] After hours, please call Elink at [phone]

## 2021-05-28 NOTE — Progress Notes (Signed)
PHARMACY - TOTAL PARENTERAL NUTRITION CONSULT NOTE  Indication: Small bowel obstruction  Patient Measurements: Height: 5\' 11"  (180.3 cm) Weight: 67.9 kg (149 lb 11.1 oz) IBW/kg (Calculated) : 75.3 TPN AdjBW (KG): 67.9 Body mass index is 20.88 kg/m. Usual Weight: 154 lb 02/28/21  Assessment:  34 yom with ESRD admitted with worsening abdominal pain, found to have high-grade SBO. He had recent admission at [facility] for SBO resolved with conservative management; also had choledocholithiasis s/p ERCP with stone extraction, stent to CBD and PD. Lap chole recommended but declined. Surgery reports no flatus or BM except with enema on 12/5 and unlikely to resolve without surgical intervention. Given prolonged NPO status, pharmacy consulted for TPN for nutritional support. At risk for refeeding syndrome.  Glucose / Insulin: no hx DM, CBGs controlled. SSI d/c'd 12/10 Electrolytes: Na 134 stable, K 3.6>4.8 (Renal using high-K HD bath; goal >/= 4 for Afib/ileus), CO2 down to 21, others stable WNL Renal: ESRD on HD TTS - off schedule. Next planned 12/12 Hepatic: LFTs / TG WNL, Tbili down to 1.5 (no evidence of jaundice per RN 12/7), albumin 1.9 Intake / Output; MIVF: still makes minimal amount of urine; NG output 358mL, drain output 53mL, no flatus or BM documented post-op GI Imaging:  12/3 Abd XR: Probable persistent SBO not well evaluated 12/4 CT A/P: diffusely dilated SB with decompressed terminal ileum c/w SBO 12/6 Abd XR: dilation of small-bowel loops suggesting ileus or pSBO 12/7 Abd XR: unchanged GI Surgeries / Procedures:  12/7 - cholecystectomy with SBR  Central access: IJ placed by IR 05/23/21 TPN start date: 05/23/21  Nutritional Goals: RD Estimated Needs Total Energy Estimated Needs: 1900-2100 Total Protein Estimated Needs: 100-115 gm Total Fluid Estimated Needs: 1.9 L  Current Nutrition:  TPN; NPO  Plan:  Continue concentrated TPN at goal rate of 65 mL/hr to provide 101g AA,  273g CHO and 58g ILE for a total of 1912 kCal, meeting 100% of needs Electrolytes in TPN: Na 165mEq/L, remove K (may need to add back small amount at some point but large trend up in K today with no HD planned until 12/12), Ca 39mEq/L, Mag 29mEq/L, Phos 25mmol/L since 12/6, change Cl:Ac to 1:1 (from max Cl) Add standard MVI and trace elements to TPN; remove chromium in patient on RRT Monitor daily CBG check and adjust as needed (SSI d/c'd 12/10) F/U TPN labs Mon/Thurs and PRN (Renal using high K baths)   Arturo Morton, PharmD, BCPS Please check AMION for all [place] contact numbers Clinical Pharmacist 05/28/2021 8:09 AM

## 2021-05-28 NOTE — Progress Notes (Addendum)
PROGRESS NOTE    Patrick [name]  LHT:[id number] DOB: [birth year] DOA: 05/19/2021 PCP: Dorena Dew, FNP    Chief Complaint  Patient presents with   Small Bowel Obstruction    Brief Narrative:   H/o sickle cell anemia,,  A. fib on Eliquis, combined CHF, ESRD on HD TTS who was recently hospitalized in [place] for Choledocholithiasis w recent ERCP/Sphinct/CBD and PD stenting/stone extractio, he declined cholecystectomy at that time., he presented to the [facility] ED on 12/2 due to abdominal pain, found to have SBO and gangrenous cholecystitis S/p lap, cholecystectomy and SB resection 12/07, post op transferred to icu on vent, Extubated 12/8 , transferred to [facility] on 12/9   Subjective:  POD#4  NG output appear bloody, left ab drain is bloody  reports pain is controlled by pca pump No flatus, no bm,  Tolerating tpn  Assessment & Plan:   Principal Problem:   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 05/24/2021 Active Problems:   Abdominal pain   Nausea & vomiting   Anemia   End stage renal disease (HCC)   Permanent atrial fibrillation (HCC)   Chronic systolic CHF (congestive heart failure) (HCC)   SBO (small bowel obstruction) (HCC)   Protein-calorie malnutrition, severe   ABLA (acute blood loss anemia)   Empyema of gallbladder   Acute gangrenous cholecystitis s/p open cholcystectomy 05/24/2021   SBO/gangrenous cholecystitis/post op ileus  -S/p Diagnostic laparoscopy,  Small bowel resection with primary anastomosis,   Open cholecystectomy on 12/7 -drain in place, Ng suction, tpn, zosyn,  -ng suction appear bloody, left ab drain appear bloody, monitor cbc q8hrs, general surgery made aware -Awaiting for bowel function -Plan per general surgery  1:40pm Addendum: hgb came back is  6.6, give prbc x1, discussed with general surgery Dr Grandville Silos who  is currently in patient's room to adjust patient's NG tube who recommend  prbc transfusion , does not need GI consult  per Dr Grandville Silos.  Will continue monitor hgb   Afib Remain in afib, rate controlled  On and off amiodarone drip for rate control Anticoagulation held due to anemia and recent surgery Cardiology following, will follow cardiology recommendation  Chronic combined chf/Pulmonary HTN -Echo this admit suggestive of restrictive CM with EF 40-45%, RV mildly decreased, severe MR, severe TR, RVSP 55 mmHg -Volume managed by dialysis Cardiology/heart failure team following  HTN: bp stable off home bp meds  ESRD Electrolytes abnormalities  HD per Nephrology   Acute blood loss anemia / history of sickle cell anemia and anemia of chronic disease Prbcx5units so far FFP x2 on 12/7 Monitor cbc  Anemia with h/o GI bleed, seen by GI, no plan to  repeat egd as patient had multiple EGD in the past withseveral findings but insignificant per GI GI recommend PPI twice daily Detail please see GI notes from 05/22/2021  Sickle cell anemia Home medication Hydrea on hold due to n.p.o. status  Legally blind, reports lives by himself  COPD stable on Brovana ,Pulmicort ,yupelri, quit smoking in 2017 No wheezing on exam, no cough  Nutritional Assessment:  The patient's BMI is: Body mass index is 20.88 kg/m.Marland Kitchen  Seen by dietician.  I agree with the assessment and plan as outlined below:  Nutrition Status: Nutrition Problem: Severe Malnutrition Etiology: acute illness (SBO) Signs/Symptoms: moderate fat depletion, moderate muscle depletion, percent weight loss (8% weight loss within 3 months) Percent weight loss: 8 % Interventions: TPN  .    Unresulted Labs (From admission, onward)  Start     Ordered   05/29/21 0500  Triglycerides  (TPN Lab Panel)  Every Monday (0500),   R     Question:  Specimen collection method  Answer:  Lab=Lab collect   05/23/21 1021   05/28/21 1200  CBC  Now then every 8 hours,   R (with TIMED occurrences)     Question:  Specimen collection method  Answer:  Unit=Unit  collect   05/28/21 1215   05/25/21 0500  Comprehensive metabolic panel  (TPN Lab Panel)  Every Mon,Thu (0500),   R     Question:  Specimen collection method  Answer:  Lab=Lab collect   05/23/21 1021   05/25/21 0500  Magnesium  (TPN Lab Panel)  Every Mon,Thu (0500),   R     Question:  Specimen collection method  Answer:  Lab=Lab collect   05/23/21 1021   05/25/21 0500  Phosphorus  (TPN Lab Panel)  Every Mon,Thu (0500),   R     Question:  Specimen collection method  Answer:  Lab=Lab collect   05/23/21 1021   05/23/21 0500  CBC  Tomorrow morning,   R       Question:  Specimen collection method  Answer:  Lab=Lab collect   05/22/21 1600              DVT prophylaxis: SCDs Start: 05/19/21 2116   Code Status:full Family Communication: Patient Disposition:   Status is: Inpatient  Dispo: The patient is from: Home, lives by himself              Anticipated d/c is to: SNF              Anticipated d/c date is: To be determined                Consultants:  General surgery Cardiology Critical care Nephrology  Procedures:   Diagnostic laparoscopy,  Small bowel resection with primary anastomosis,   Open cholecystectomy on 12/7  Antimicrobials:   Anti-infectives (From admission, onward)    Start     Dose/Rate Route Frequency Ordered Stop   05/24/21 1730  piperacillin-tazobactam (ZOSYN) IVPB 2.25 g        2.25 g 100 mL/hr over 30 Minutes Intravenous Every 8 hours 05/24/21 1654     05/24/21 1615  metroNIDAZOLE (FLAGYL) IVPB 500 mg       Note to Pharmacy: Please tube to #[phone]    500 mg 100 mL/hr over 60 Minutes Intravenous  Once 05/24/21 1603 05/24/21 1630           Objective: Vitals:   05/28/21 0753 05/28/21 0800 05/28/21 0919 05/28/21 1200  BP:  132/85    Pulse: 83 85    Resp: 18 18 18 18   Temp:  98.4 F (36.9 C)    TempSrc:  Oral    SpO2: 100% 100% 98% 98%  Weight:      Height:        Intake/Output Summary (Last 24 hours) at 05/28/2021 1215 Last data filed  at 05/28/2021 1000 Gross per 24 hour  Intake 1258.24 ml  Output 925 ml  Net 333.24 ml   Filed Weights   05/26/21 1815 05/27/21 0500 05/27/21 2243  Weight: 67.5 kg 68.1 kg 67.9 kg    Examination:  General exam: alert, awake, communicative,calm , legally blind, able to see colors and shadows  Respiratory system: diminished at basis, no wheezing, no rales, no rhonchi. Respiratory effort normal. Cardiovascular system:  IRRR.  Gastrointestinal system:  Abdomen post op changes, quiet abdomen, no significant tenderness, dressing intact, drain to left ab. Central nervous system: Alert and oriented.  Extremities:  no edema Skin: No rashes, lesions or ulcers Psychiatry: Judgement and insight appear normal. Mood & affect appropriate.     Data Reviewed: I have personally reviewed following labs and imaging studies  CBC: Recent Labs  Lab 05/22/21 0252 05/23/21 0131 05/24/21 0400 05/24/21 1541 05/24/21 1855 05/25/21 0609 05/25/21 1335 05/26/21 0508 05/27/21 0600  WBC 7.2 9.0 8.8  --  5.6 11.3*  --  14.5* 12.5*  NEUTROABS 6.0 6.6 6.8  --   --   --   --   --   --   HGB 7.6* 7.8* 7.5*   < > 7.9* 6.9* 8.7* 8.1* 8.0*  HCT 21.0* 22.5* 21.9*   < > 22.6* 19.0* 24.4* 23.0* 22.8*  MCV 96.8 100.0 100.0  --  93.0 93.1  --  92.7 91.9  PLT 176 193 174  --  145* 113*  --  122* 120*   < > = values in this interval not displayed.    Basic Metabolic Panel: Recent Labs  Lab 05/22/21 0252 05/23/21 0131 05/24/21 0400 05/24/21 1541 05/24/21 1855 05/25/21 0609 05/26/21 0508 05/27/21 0751 05/28/21 0655  NA 135 136 138   < > 137 134* 134* 134* 134*  K 3.9 3.9 3.9   < > 3.3* 3.6 3.3* 3.6 4.8  CL 95* 95* 95*   < > 100 98 96* 100 101  CO2 27 27 29   --  26 25 24 27  21*  GLUCOSE 103* 160* 108*   < > 140* 210* 102* 116* 115*  BUN 59* 75* 81*   < > 21 30* 47* 29* 48*  CREATININE 4.85* 5.89* 7.06*   < > 2.89* 3.69* 4.71* 3.44* 4.73*  CALCIUM 8.2* 8.6* 8.6*  --  7.7* 8.0* 8.3* 8.6* 8.4*  MG 2.2 2.4  2.6*  --   --  1.9 2.1  --   --   PHOS 5.8* 5.3* 6.2*  --   --  4.1 4.1  --   --    < > = values in this interval not displayed.    GFR: Estimated Creatinine Clearance: 15.4 mL/min (A) (by C-G formula based on SCr of 4.73 mg/dL (H)).  Liver Function Tests: Recent Labs  Lab 05/22/21 0252 05/23/21 0131 05/24/21 0400 05/25/21 0609  AST 27 33 35 32  ALT 17 16 18 16   ALKPHOS 152* 168* 163* 110  BILITOT 2.1* 2.0* 2.3* 1.5*  PROT 5.1* 5.9* 6.0* 4.8*  ALBUMIN 2.1* 2.4* 2.4* 1.9*    CBG: Recent Labs  Lab 05/26/21 1156 05/26/21 2105 05/27/21 0613 05/27/21 0751 05/28/21 0817  GLUCAP 95 114* 116* 117* 111*     Recent Results (from the past 240 hour(s))  Resp Panel by RT-PCR (Flu A&B, Covid) Nasopharyngeal Swab     Status: None   Collection Time: 05/19/21  6:29 PM   Specimen: Nasopharyngeal Swab; Nasopharyngeal(NP) swabs in vial transport medium  Result Value Ref Range Status   SARS Coronavirus 2 by RT PCR NEGATIVE NEGATIVE Final    Comment: (NOTE) SARS-CoV-2 target nucleic acids are NOT DETECTED.  The SARS-CoV-2 RNA is generally detectable in upper respiratory specimens during the acute phase of infection. The lowest concentration of SARS-CoV-2 viral copies this assay can detect is 138 copies/mL. A negative result does not preclude SARS-Cov-2 infection and should not be used as the sole basis for treatment or other  patient management decisions. A negative result may occur with  improper specimen collection/handling, submission of specimen other than nasopharyngeal swab, presence of viral mutation(s) within the areas targeted by this assay, and inadequate number of viral copies(<138 copies/mL). A negative result must be combined with clinical observations, patient history, and epidemiological information. The expected result is Negative.  Fact Sheet for Patients:  EntrepreneurPulse.com.au  Fact Sheet for Healthcare Providers:   IncredibleEmployment.be  This test is no t yet approved or cleared by the [place] FDA and  has been authorized for detection and/or diagnosis of SARS-CoV-2 by FDA under an Emergency Use Authorization (EUA). This EUA will remain  in effect (meaning this test can be used) for the duration of the COVID-19 declaration under Section 564(b)(1) of the Act, 21 U.S.C.section 360bbb-3(b)(1), unless the authorization is terminated  or revoked sooner.       Influenza A by PCR NEGATIVE NEGATIVE Final   Influenza B by PCR NEGATIVE NEGATIVE Final    Comment: (NOTE) The Xpert Xpress SARS-CoV-2/FLU/RSV plus assay is intended as an aid in the diagnosis of influenza from Nasopharyngeal swab specimens and should not be used as a sole basis for treatment. Nasal washings and aspirates are unacceptable for Xpert Xpress SARS-CoV-2/FLU/RSV testing.  Fact Sheet for Patients: EntrepreneurPulse.com.au  Fact Sheet for Healthcare Providers: IncredibleEmployment.be  This test is not yet approved or cleared by the [place] FDA and has been authorized for detection and/or diagnosis of SARS-CoV-2 by FDA under an Emergency Use Authorization (EUA). This EUA will remain in effect (meaning this test can be used) for the duration of the COVID-19 declaration under Section 564(b)(1) of the Act, 21 U.S.C. section 360bbb-3(b)(1), unless the authorization is terminated or revoked.  Performed at [facility], [place] [address] [place], [place] [address]   MRSA Next Gen by PCR, Nasal     Status: None   Collection Time: 05/24/21  5:54 PM   Specimen: Nasal Mucosa; Nasal Swab  Result Value Ref Range Status   MRSA by PCR Next Gen NOT DETECTED NOT DETECTED Final    Comment: (NOTE) The GeneXpert MRSA Assay (FDA approved for NASAL specimens only), is one component of a comprehensive MRSA colonization surveillance program. It is not intended to diagnose  MRSA infection nor to guide or monitor treatment for MRSA infections. Test performance is not FDA approved in patients less than 90 years old. Performed at [facility], [place] [address] [place], [place] [address]          Radiology Studies: No results found.      Scheduled Meds:  arformoterol  15 mcg Nebulization BID   budesonide (PULMICORT) nebulizer solution  0.25 mg Nebulization BID   Chlorhexidine Gluconate Cloth  6 each Topical Daily   darbepoetin (ARANESP) injection - DIALYSIS  200 mcg Intravenous Q Sat-HD   HYDROmorphone   Intravenous Q4H   mouth rinse  15 mL Mouth Rinse BID   pantoprazole (PROTONIX) IV  40 mg Intravenous Q12H   revefenacin  175 mcg Nebulization Daily   sodium chloride flush  10-40 mL Intracatheter Q12H   Continuous Infusions:  amiodarone Stopped (05/25/21 1553)   methocarbamol (ROBAXIN) IV     piperacillin-tazobactam (ZOSYN)  IV 2.25 g (05/28/21 0934)   TPN ADULT (ION) 65 mL/hr at 05/27/21 2100   TPN ADULT (ION)       LOS: 9 days   Time spent: 24mins Greater than 50% of this time was spent in counseling, explanation of diagnosis,  planning of further management, and coordination of care.   Voice Recognition Viviann Spare dictation system was used to create this note, attempts have been made to correct errors. Please contact the author with questions and/or clarifications.   Florencia Reasons, MD PhD FACP [facility]  Available via Epic secure chat 7am-7pm for nonurgent issues Please page for urgent issues To page the attending provider between 7A-7P or the covering provider during after hours 7P-7A, please log into the web site [email or web address] and access using universal [place] password for that web site. If you do not have the password, please call the hospital operator.    05/28/2021, 12:15 PM

## 2021-05-28 NOTE — Progress Notes (Signed)
Paged MD in regards to no change to NG tube contents appearance still very thick and bloody. Pt VSS. New order flush NG with 20CC water Q2Hour . Night Rn aware and updated.  Phoebe Sharps, RN

## 2021-05-28 NOTE — Progress Notes (Signed)
MEET Patrick [name] [id number] [birth year]  CARE TEAM:  PCP: Dorena Dew, FNP  Outpatient Care Team: Patient Care Team: Dorena Dew, FNP as PCP - General (Family Medicine) Zadie Rhine Clent Demark, MD as Consulting Physician (Ophthalmology) Jamal Maes, MD as Consulting Physician (Nephrology)  Inpatient Treatment Team: Treatment Team: Attending Provider: Florencia Reasons, MD; Consulting Physician: Edison Pace, Md, MD; Consulting Physician: Roney Jaffe, MD; Physician Assistant: Loren Racer, PA-C; Respiratory Therapist: Judith Part, RRT; Rounding Team: Lbcardiology, Rounding, MD; Case Manager: Rozell Searing, RN; Rounding Team: Clenton Pare, MD; Social Worker: Polo, Cortlin, LCSW; Technician: Architectural technologist, Neldon Labella, NT; Utilization Review: Bedingfield, Chiquita Loth, RN; Registered Nurse: Phoebe Sharps, RN   Problem List:   Principal Problem:   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 05/24/2021 Active Problems:   Abdominal pain   Nausea & vomiting   Anemia   End stage renal disease ([place])   Permanent atrial fibrillation (HCC)   Chronic systolic CHF (congestive heart failure) (HCC)   SBO (small bowel obstruction) ([place])   Protein-calorie malnutrition, severe   ABLA (acute blood loss anemia)   Empyema of gallbladder   Acute gangrenous cholecystitis s/p open cholcystectomy 05/24/2021   4 Days Post-Op  05/24/2021  Postoperative diagnosis:  Gangrenous cholecystitis,  chronic small bowel ischemia  Procedure:  1.  Diagnostic laparoscopy 2.  Small bowel resection with primary anastomosis 3.  Open cholecystectomy  Surgeon: Dr. Serita Grammes    Assessment  Recovering but guarded  [facility] Stay = 9 days)  s/p dx lap converted to ex lap for SB resection and cholecystectomy  for gangrenous cholecystitis and chronic ischemic segment of SB causing an obstruction, Dr. Donne Hazel 05/24/21 -ERCP at [facility] -dressing changes - switch to wound vac qMWF  -cont  NGT for post op ileus until flatus, then clamping trial -cont JP drain due to intra-op findings - hopefully d/c prior to discharge out of hospital -mobilize as able, therapies already written for. -TPN for nutritional support while has ileus -cont IV ABx x 5 days (pip/tazo Zosyn) per protocol & regroup.  Low threshold for f/u CT scan to r/o abscess since high risk w PATOS infection at surgery.  WBC falling a hopeful sign   FEN: NPO, NGT to LIWS, TPN VTE: SCDs, DVT prophylaxis on hold til hgb stabilizes ID: zosyn 12/8-   Renal: ESRD - hemodialysis TTS per nephrology CHF - per cards A. Fib on eliquis, on hold Anemia - ABLA on top of chronic disease - transfuse as needed Acute hypoxic respiratory failure - extubated and improving      30 minutes spent in review, evaluation, examination, counseling, and coordination of care.   I have reviewed this patient's available data, including medical history, events of note, physical examination and test results as part of my evaluation.  A significant portion of that time was spent in counseling.  Care during the described time interval was provided by me.  05/28/2021    Subjective: (Chief complaint)  Transferred to floor  Pain controlled  NG tube clogged.    Nursing just outside room  Objective:  Vital signs:  Vitals:   05/27/21 2243 05/28/21 0200 05/28/21 0342 05/28/21 0400  BP: (!) 149/93  133/82   Pulse: 92  79   Resp:  11 14 14   Temp: 98 F (36.7 C)  97.9 F (36.6 C)   TempSrc: Oral  Oral   SpO2: 96% 98% 100% 100%  Weight: 67.9 kg     Height:  5\' 11"  (1.803 m)       Last BM Date: 05/22/21  Intake/Output   Yesterday:  12/10 0701 - 12/11 0700 In: 1675.6 [P.O.:120; I.V.:1405.8; IV Piggyback:149.9] Out: 600 [Urine:250; Emesis/NG output:300; Drains:50] This shift:  No intake/output data recorded.  Bowel function:  Flatus: No  BM:  No  Drain: Serous   Physical Exam:  General: Pt awake/alert in no acute  distress Eyes: PERRL, normal EOM.  Sclera clear.  No icterus Neuro: CN II-XII intact w/o focal sensory/motor deficits. Lymph: No head/neck/groin lymphadenopathy Psych:  No delerium/psychosis/paranoia.  Oriented x 4 HENT: Normocephalic, Mucus membranes moist.  No thrush Neck: Supple, No tracheal deviation.  No obvious thyromegaly Chest: No pain to chest wall compression.  Good respiratory excursion.  No audible wheezing CV:  Pulses intact.  Regular rhythm.  No major extremity edema MS: Normal AROM mjr joints.  No obvious deformity  Abdomen: Somewhat firm.  Mildy distended.  Tenderness at midline incision.  Dressing clean without any bleeding nor purulence at wound .  No evidence of peritonitis.  No incarcerated hernias.  Ext:   No deformity.  No mjr edema.  No cyanosis Skin: No petechiae / purpurea.  No major sores.  Warm and dry    Results:   Cultures: Recent Results (from the past 720 hour(s))  Resp Panel by RT-PCR (Flu A&B, Covid) Nasopharyngeal Swab     Status: None   Collection Time: 05/19/21  6:29 PM   Specimen: Nasopharyngeal Swab; Nasopharyngeal(NP) swabs in vial transport medium  Result Value Ref Range Status   SARS Coronavirus 2 by RT PCR NEGATIVE NEGATIVE Final    Comment: (NOTE) SARS-CoV-2 target nucleic acids are NOT DETECTED.  The SARS-CoV-2 RNA is generally detectable in upper respiratory specimens during the acute phase of infection. The lowest concentration of SARS-CoV-2 viral copies this assay can detect is 138 copies/mL. A negative result does not preclude SARS-Cov-2 infection and should not be used as the sole basis for treatment or other patient management decisions. A negative result may occur with  improper specimen collection/handling, submission of specimen other than nasopharyngeal swab, presence of viral mutation(s) within the areas targeted by this assay, and inadequate number of viral copies(<138 copies/mL). A negative result must be combined  with clinical observations, patient history, and epidemiological information. The expected result is Negative.  Fact Sheet for Patients:  EntrepreneurPulse.com.au  Fact Sheet for Healthcare Providers:  IncredibleEmployment.be  This test is no t yet approved or cleared by the [place] FDA and  has been authorized for detection and/or diagnosis of SARS-CoV-2 by FDA under an Emergency Use Authorization (EUA). This EUA will remain  in effect (meaning this test can be used) for the duration of the COVID-19 declaration under Section 564(b)(1) of the Act, 21 U.S.C.section 360bbb-3(b)(1), unless the authorization is terminated  or revoked sooner.       Influenza A by PCR NEGATIVE NEGATIVE Final   Influenza B by PCR NEGATIVE NEGATIVE Final    Comment: (NOTE) The Xpert Xpress SARS-CoV-2/FLU/RSV plus assay is intended as an aid in the diagnosis of influenza from Nasopharyngeal swab specimens and should not be used as a sole basis for treatment. Nasal washings and aspirates are unacceptable for Xpert Xpress SARS-CoV-2/FLU/RSV testing.  Fact Sheet for Patients: EntrepreneurPulse.com.au  Fact Sheet for Healthcare Providers: IncredibleEmployment.be  This test is not yet approved or cleared by the [place] FDA and has been authorized for detection and/or diagnosis of SARS-CoV-2 by FDA under an Emergency Use Authorization (EUA).  This EUA will remain in effect (meaning this test can be used) for the duration of the COVID-19 declaration under Section 564(b)(1) of the Act, 21 U.S.C. section 360bbb-3(b)(1), unless the authorization is terminated or revoked.  Performed at [facility], [place] [address], [place], [place] [address]   MRSA Next Gen by PCR, Nasal     Status: None   Collection Time: 05/24/21  5:54 PM   Specimen: Nasal Mucosa; Nasal Swab  Result Value Ref Range Status   MRSA by PCR Next Gen NOT  DETECTED NOT DETECTED Final    Comment: (NOTE) The GeneXpert MRSA Assay (FDA approved for NASAL specimens only), is one component of a comprehensive MRSA colonization surveillance program. It is not intended to diagnose MRSA infection nor to guide or monitor treatment for MRSA infections. Test performance is not FDA approved in patients less than 10 years old. Performed at [facility], [place] [address], [place], [place] [address]     Labs: Results for orders placed or performed during the hospital encounter of 05/19/21 (from the past 48 hour(s))  Glucose, capillary     Status: Abnormal   Collection Time: 05/26/21  8:32 AM  Result Value Ref Range   Glucose-Capillary 131 (H) 70 - 99 mg/dL    Comment: Glucose reference range applies only to samples taken after fasting for at least 8 hours.  Glucose, capillary     Status: None   Collection Time: 05/26/21 11:56 AM  Result Value Ref Range   Glucose-Capillary 95 70 - 99 mg/dL    Comment: Glucose reference range applies only to samples taken after fasting for at least 8 hours.  Glucose, capillary     Status: Abnormal   Collection Time: 05/26/21  9:05 PM  Result Value Ref Range   Glucose-Capillary 114 (H) 70 - 99 mg/dL    Comment: Glucose reference range applies only to samples taken after fasting for at least 8 hours.  CBC     Status: Abnormal   Collection Time: 05/27/21  6:00 AM  Result Value Ref Range   WBC 12.5 (H) 4.0 - 10.5 K/uL   RBC 2.48 (L) 4.22 - 5.81 MIL/uL   Hemoglobin 8.0 (L) 13.0 - 17.0 g/dL   HCT 22.8 (L) 39.0 - 52.0 %   MCV 91.9 80.0 - 100.0 fL   MCH 32.3 26.0 - 34.0 pg   MCHC 35.1 30.0 - 36.0 g/dL   RDW 23.2 (H) 11.5 - 15.5 %   Platelets 120 (L) 150 - 400 K/uL    Comment: Immature Platelet Fraction may be clinically indicated, consider ordering this additional test [id number] REPEATED TO VERIFY    nRBC 1.6 (H) 0.0 - 0.2 %    Comment: Performed at [facility], [address], [place], [place] [address]   Glucose, capillary     Status: Abnormal   Collection Time: 05/27/21  6:13 AM  Result Value Ref Range   Glucose-Capillary 116 (H) 70 - 99 mg/dL    Comment: Glucose reference range applies only to samples taken after fasting for at least 8 hours.  Basic metabolic panel     Status: Abnormal   Collection Time: 05/27/21  7:51 AM  Result Value Ref Range   Sodium 134 (L) 135 - 145 mmol/L   Potassium 3.6 3.5 - 5.1 mmol/L   Chloride 100 98 - 111 mmol/L   CO2 27 22 - 32 mmol/L   Glucose, Bld 116 (H) 70 - 99 mg/dL  Comment: Glucose reference range applies only to samples taken after fasting for at least 8 hours.   BUN 29 (H) 8 - 23 mg/dL   Creatinine, Ser 3.44 (H) 0.61 - 1.24 mg/dL   Calcium 8.6 (L) 8.9 - 10.3 mg/dL   GFR, Estimated 19 (L) >60 mL/min    Comment: (NOTE) Calculated using the CKD-EPI Creatinine Equation (2021)    Anion gap 7 5 - 15    Comment: Performed at [facility] [address], [place], [place] [address]  Glucose, capillary     Status: Abnormal   Collection Time: 05/27/21  7:51 AM  Result Value Ref Range   Glucose-Capillary 117 (H) 70 - 99 mg/dL    Comment: Glucose reference range applies only to samples taken after fasting for at least 8 hours.    Imaging / Studies: No results found.  Medications / Allergies: per chart  Antibiotics: Anti-infectives (From admission, onward)    Start     Dose/Rate Route Frequency Ordered Stop   05/24/21 1730  piperacillin-tazobactam (ZOSYN) IVPB 2.25 g        2.25 g 100 mL/hr over 30 Minutes Intravenous Every 8 hours 05/24/21 1654     05/24/21 1615  metroNIDAZOLE (FLAGYL) IVPB 500 mg       Note to Pharmacy: Please tube to #[phone]    500 mg 100 mL/hr over 60 Minutes Intravenous  Once 05/24/21 1603 05/24/21 1630         Note: Portions of this report may have been transcribed using voice recognition software. Every effort was made to ensure accuracy; however, inadvertent computerized transcription errors may be  present.   Any transcriptional errors that result from this process are unintentional.    Adin Hector, MD, FACS, MASCRS Esophageal, Gastrointestinal & Colorectal Surgery Robotic and Minimally Invasive Surgery  [facility], [place]  [place]. [address], [place], [place] [address] [phone] Fax [phone] [address]  CONTACT INFORMATION:  Weekday (9AM-5PM): Call [facility] main office at [phone]  Weeknight (5PM-9AM) or Weekend/Holiday: Check [email or web address] (password " [email or web address]") for General Surgery [facility] coverage  (Please, do not use SecureChat as it is not reliable communication to operating surgeons for immediate patient care)      05/28/2021  7:41 AM

## 2021-05-28 NOTE — Significant Event (Addendum)
Rapid Response Event Note   Reason for Call :  Second set of eyes.  Pt began having bloody drainage from NGT on day shift today. HGB was 6.6-1 unit PRBCs were ordered as well as q8h CBC. RRT was called d/t pt now coughing up blood and having runs of vtach.   Initial Focused Assessment:  Pt lying in bed with eyes open, in no visible distress. He is alert and oriented, c/o nausea and dizziness. Pt denies chest pain/SOB. He is a little lethargic compared to when I saw him yesterday. Lungs clear, diminished in the bases. ABD tender, firm, distended. Skin cool to touch, dry  T-98.1, HR-110, RR-18, SpO2-100% on RA  Interventions:  BMP, MG, HFP, CBC, LA PCXR-Improved central vascular congestion. DDAVP 64mcg IV  Plan of Care:  VSS(except for mild tachycardia) at this time. Labs to be draw at 2100(1hr after PRBCs finished). Await results and relay to MD. Give DDVAP per [facility] MD. Continue to monitor closely. Call RRT if further assistance needed.   Event Summary:   MD Notified: Dr Lajean Silvius) and Dr. Stechschulte([facility]) Call [place] [phone] End Time:2100   Update: 2130-GI consulted: Protonix gtt ordered and they will consult in AM for possible EGD.    Update: 2300-Hgb-6.6(this is same as it was prior to pt receiving 1 unit PRBCs). Pt now coughing up bloody clots. HR-115, SBP-90. 1unit PRBCs ordered. PCCM consulted. Will move to ICU for closer monitoring.     Dillard Essex, RN

## 2021-05-28 NOTE — Progress Notes (Signed)
Subjective:  Awake and alert  Still no flatus Remains on TPN, has NGT for ileus  Objective Vital signs in last 24 hours: Vitals:   05/28/21 0400 05/28/21 0723 05/28/21 0753 05/28/21 0800  BP:    132/85  Pulse:   83   Resp: 14 18 18 18   Temp:    98.4 F (36.9 C)  TempSrc:    Oral  SpO2: 100% 100% 100%   Weight:      Height:       Weight change: -1.6 kg  Physical Exam: General: intubated, awak;thin chronically ill male Heart: RRR no MRG noted Lungs: coarse BS b/l Abdomen: incisions bandaged Extremities: No pedal edema  dialysis Access: RUA aVF+ bruit  Dialysis Orders: TTS at AF 4hr, 400/500, EDW 65kg, 2K/2Ca, UFP #2, AVF, no heparin - Sensipar 30mg  PO q HD - Venofer 50mg  IV weekly - Mircera 210mcg IV q 2 weeks (last 11/8)   Problem/Plan;    SBO, recent gallstone with CBD/pancreatic duct stenting: s/p ex lap, sb resection. Cholecystectomy 05/24/21; on TPN.  No return of bowewl function yet ESRD:  Off schedule. Next Tx Monday 12/12: AVF400/600, no heparin, 2K 2-3L UF  Hypertension/volume: BP stable, vol status controlled wit hHD Anemia of ESRD and history of sickle cell= Aranesp with HD tomorrow.  Check Fe values, doubt low  Metabolic bone disease: Ca/Phos ok - resume binders when cleared to eat.  Nutrition: on TPN. Has NGT, will order supplements when cleared to eat.  A-fib: per cardiology    Rexene Agent, MD  [facility] 05/28/2021,9:07 AM  LOS: 9 days   Labs: Basic Metabolic Panel: Recent Labs  Lab 05/24/21 0400 05/24/21 1541 05/25/21 0609 05/26/21 0508 05/27/21 0751 05/28/21 0655  NA 138   < > 134* 134* 134* 134*  K 3.9   < > 3.6 3.3* 3.6 4.8  CL 95*   < > 98 96* 100 101  CO2 29   < > 25 24 27  21*  GLUCOSE 108*   < > 210* 102* 116* 115*  BUN 81*   < > 30* 47* 29* 48*  CREATININE 7.06*   < > 3.69* 4.71* 3.44* 4.73*  CALCIUM 8.6*   < > 8.0* 8.3* 8.6* 8.4*  PHOS 6.2*  --  4.1 4.1  --   --    < > = values in this interval not  displayed.    Liver Function Tests: Recent Labs  Lab 05/23/21 0131 05/24/21 0400 05/25/21 0609  AST 33 35 32  ALT 16 18 16   ALKPHOS 168* 163* 110  BILITOT 2.0* 2.3* 1.5*  PROT 5.9* 6.0* 4.8*  ALBUMIN 2.4* 2.4* 1.9*    No results for input(s): LIPASE, AMYLASE in the last 168 hours.  No results for input(s): AMMONIA in the last 168 hours. CBC: Recent Labs  Lab 05/22/21 0252 05/23/21 0131 05/24/21 0400 05/24/21 1541 05/24/21 1855 05/25/21 0609 05/25/21 1335 05/26/21 0508 05/27/21 0600  WBC 7.2 9.0 8.8  --  5.6 11.3*  --  14.5* 12.5*  NEUTROABS 6.0 6.6 6.8  --   --   --   --   --   --   HGB 7.6* 7.8* 7.5*   < > 7.9* 6.9* 8.7* 8.1* 8.0*  HCT 21.0* 22.5* 21.9*   < > 22.6* 19.0* 24.4* 23.0* 22.8*  MCV 96.8 100.0 100.0  --  93.0 93.1  --  92.7 91.9  PLT 176 193 174  --  145* 113*  --  122* 120*   < > = values in this interval not displayed.    Cardiac Enzymes: No results for input(s): CKTOTAL, CKMB, CKMBINDEX, TROPONINI in the last 168 hours. CBG: Recent Labs  Lab 05/26/21 1156 05/26/21 2105 05/27/21 0613 05/27/21 0751 05/28/21 0817  GLUCAP 95 114* 116* 117* 111*      Medications:  amiodarone Stopped (05/25/21 1553)   methocarbamol (ROBAXIN) IV     piperacillin-tazobactam (ZOSYN)  IV 2.25 g (05/28/21 0355)   TPN ADULT (ION) 65 mL/hr at 05/27/21 2100    arformoterol  15 mcg Nebulization BID   budesonide (PULMICORT) nebulizer solution  0.25 mg Nebulization BID   Chlorhexidine Gluconate Cloth  6 each Topical Daily   darbepoetin (ARANESP) injection - DIALYSIS  200 mcg Intravenous Q Sat-HD   HYDROmorphone   Intravenous Q4H   mouth rinse  15 mL Mouth Rinse BID   pantoprazole (PROTONIX) IV  40 mg Intravenous Q12H   revefenacin  175 mcg Nebulization Daily   sodium chloride flush  10-40 mL Intracatheter Q12H

## 2021-05-28 NOTE — Significant Event (Signed)
Patient was noticed to have persistent bloody drainage from NG tube since this morning.  Also noticed that patient is also coughing up some blood.  On exam at bedside patient is tachycardic but denies any chest pain or shortness of breath.  Has some mild abdominal discomfort.  Afebrile blood pressure is 120/80 tachycardic with heart rate around 120.  Respiration around 20/min.  Discussed with on-call general surgeon Dr. Lanny Hurst and on-call gastroenterologist Dr. Rush Landmark.  Dr. Stefani Dama already advised starting patient on Protonix infusion and they will be seeing patient in consult for EGD.  Repeat hemoglobin came back at 6.6.  Ordering 1 more unit of PRBC.  We will also request PCCM consult.  General surgery has ordered DDAVP.  Reviewed patient's charts labs and medications.  Patrick [name]

## 2021-05-28 NOTE — Progress Notes (Signed)
Pt nauseated and NG tube contents are very thick and still bloody. MD notified   Phoebe Sharps, RN

## 2021-05-28 NOTE — Progress Notes (Signed)
Patient ID: Patrick [name], male   DOB: Nov 26, 1955, 65 y.o.   MRN: [id number] Notified by RN of bloody NGT output. On my arrival, it does look bloody with several hundred CCs in the canister. I irrigated the NGT with water multiple times. There does not appear to be ongoing bleeding. Output more clear now. I D/W Dr. Erlinda Hong. Agree with transfusion and follow Hb. He is on Protonix Q12h.  Georganna Skeans, MD, MPH, FACS Please use [email or web address] to contact on call provider

## 2021-05-29 ENCOUNTER — Encounter (HOSPITAL_COMMUNITY): Payer: Self-pay | Admitting: Internal Medicine

## 2021-05-29 ENCOUNTER — Inpatient Hospital Stay (HOSPITAL_COMMUNITY): Payer: HMO | Admitting: Anesthesiology

## 2021-05-29 ENCOUNTER — Inpatient Hospital Stay (HOSPITAL_COMMUNITY): Payer: HMO

## 2021-05-29 ENCOUNTER — Encounter (HOSPITAL_COMMUNITY)
Admission: EM | Disposition: A | Discharge status: Home Health | Payer: Self-pay | Source: Home / Self Care | Attending: Internal Medicine

## 2021-05-29 DIAGNOSIS — Z4659 Encounter for fitting and adjustment of other gastrointestinal appliance and device: Secondary | ICD-10-CM

## 2021-05-29 DIAGNOSIS — K31819 Angiodysplasia of stomach and duodenum without bleeding: Secondary | ICD-10-CM

## 2021-05-29 DIAGNOSIS — K559 Vascular disorder of intestine, unspecified: Secondary | ICD-10-CM | POA: Diagnosis not present

## 2021-05-29 DIAGNOSIS — K922 Gastrointestinal hemorrhage, unspecified: Secondary | ICD-10-CM

## 2021-05-29 HISTORY — PX: HEMOSTASIS CLIP PLACEMENT: SHX6857

## 2021-05-29 HISTORY — PX: STENT REMOVAL: SHX6421

## 2021-05-29 HISTORY — PX: SCLEROTHERAPY: SHX6841

## 2021-05-29 HISTORY — PX: ESOPHAGOGASTRODUODENOSCOPY: SHX5428

## 2021-05-29 HISTORY — PX: HOT HEMOSTASIS: SHX5433

## 2021-05-29 LAB — CBC
HCT: 19 % — ABNORMAL LOW (ref 39.0–52.0)
HCT: 22.2 % — ABNORMAL LOW (ref 39.0–52.0)
HCT: 22.1 % — ABNORMAL LOW (ref 39.0–52.0)
HCT: 20.4 % — ABNORMAL LOW (ref 39.0–52.0)
Hemoglobin: 6.5 g/dL — CL (ref 13.0–17.0)
Hemoglobin: 7.3 g/dL — ABNORMAL LOW (ref 13.0–17.0)
Hemoglobin: 7.3 g/dL — ABNORMAL LOW (ref 13.0–17.0)
Hemoglobin: 7.1 g/dL — ABNORMAL LOW (ref 13.0–17.0)
MCH: 30.2 pg (ref 26.0–34.0)
MCH: 28.6 pg (ref 26.0–34.0)
MCH: 29 pg (ref 26.0–34.0)
MCH: 30 pg (ref 26.0–34.0)
MCHC: 34.2 g/dL (ref 30.0–36.0)
MCHC: 32.9 g/dL (ref 30.0–36.0)
MCHC: 33 g/dL (ref 30.0–36.0)
MCHC: 34.8 g/dL (ref 30.0–36.0)
MCV: 88.4 fL (ref 80.0–100.0)
MCV: 87.1 fL (ref 80.0–100.0)
MCV: 87.7 fL (ref 80.0–100.0)
MCV: 86.1 fL (ref 80.0–100.0)
Platelets: 88 10*3/uL — ABNORMAL LOW (ref 150–400)
Platelets: 98 10*3/uL — ABNORMAL LOW (ref 150–400)
Platelets: 122 10*3/uL — ABNORMAL LOW (ref 150–400)
Platelets: 110 10*3/uL — ABNORMAL LOW (ref 150–400)
RBC: 2.15 MIL/uL — ABNORMAL LOW (ref 4.22–5.81)
RBC: 2.55 MIL/uL — ABNORMAL LOW (ref 4.22–5.81)
RBC: 2.52 MIL/uL — ABNORMAL LOW (ref 4.22–5.81)
RBC: 2.37 MIL/uL — ABNORMAL LOW (ref 4.22–5.81)
RDW: 20.6 % — ABNORMAL HIGH (ref 11.5–15.5)
RDW: 19.9 % — ABNORMAL HIGH (ref 11.5–15.5)
RDW: 20.2 % — ABNORMAL HIGH (ref 11.5–15.5)
RDW: 20.4 % — ABNORMAL HIGH (ref 11.5–15.5)
WBC: 13.4 10*3/uL — ABNORMAL HIGH (ref 4.0–10.5)
WBC: 14.9 10*3/uL — ABNORMAL HIGH (ref 4.0–10.5)
WBC: 18.9 10*3/uL — ABNORMAL HIGH (ref 4.0–10.5)
WBC: 13.1 10*3/uL — ABNORMAL HIGH (ref 4.0–10.5)
nRBC: 0.7 % — ABNORMAL HIGH (ref 0.0–0.2)
nRBC: 0.5 % — ABNORMAL HIGH (ref 0.0–0.2)
nRBC: 0.4 % — ABNORMAL HIGH (ref 0.0–0.2)
nRBC: 0.5 % — ABNORMAL HIGH (ref 0.0–0.2)

## 2021-05-29 LAB — PROTIME-INR
INR: 1.3 — ABNORMAL HIGH (ref 0.8–1.2)
Prothrombin Time: 15.9 seconds — ABNORMAL HIGH (ref 11.4–15.2)

## 2021-05-29 LAB — MAGNESIUM: Magnesium: 2.2 mg/dL (ref 1.7–2.4)

## 2021-05-29 LAB — DIC (DISSEMINATED INTRAVASCULAR COAGULATION)PANEL
D-Dimer, Quant: >20 ug/mL-FEU — ABNORMAL HIGH (ref 0.00–0.50)
Fibrinogen: 453 mg/dL (ref 210–475)
INR: 1.3 — ABNORMAL HIGH (ref 0.8–1.2)
Platelets: 114 10*3/uL — ABNORMAL LOW (ref 150–400)
Prothrombin Time: 15.9 seconds — ABNORMAL HIGH (ref 11.4–15.2)
Smear Review: NONE SEEN
aPTT: 33 seconds (ref 24–36)

## 2021-05-29 LAB — COMPREHENSIVE METABOLIC PANEL
ALT: 6 U/L (ref 0–44)
AST: 14 U/L — ABNORMAL LOW (ref 15–41)
Albumin: 1.5 g/dL — ABNORMAL LOW (ref 3.5–5.0)
Alkaline Phosphatase: 79 U/L (ref 38–126)
Anion gap: 9 (ref 5–15)
BUN: 65 mg/dL — ABNORMAL HIGH (ref 8–23)
CO2: 25 mmol/L (ref 22–32)
Calcium: 8.3 mg/dL — ABNORMAL LOW (ref 8.9–10.3)
Chloride: 103 mmol/L (ref 98–111)
Creatinine, Ser: 5.72 mg/dL — ABNORMAL HIGH (ref 0.61–1.24)
GFR, Estimated: 10 mL/min — ABNORMAL LOW (ref >60)
Glucose, Bld: 111 mg/dL — ABNORMAL HIGH (ref 70–99)
Potassium: 3.6 mmol/L (ref 3.5–5.1)
Sodium: 137 mmol/L (ref 135–145)
Total Bilirubin: 1.4 mg/dL — ABNORMAL HIGH (ref 0.3–1.2)
Total Protein: 4.6 g/dL — ABNORMAL LOW (ref 6.5–8.1)

## 2021-05-29 LAB — TRIGLYCERIDES: Triglycerides: 48 mg/dL (ref <150)

## 2021-05-29 LAB — GLUCOSE, CAPILLARY: Glucose-Capillary: 119 mg/dL — ABNORMAL HIGH (ref 70–99)

## 2021-05-29 LAB — LACTIC ACID, PLASMA: Lactic Acid, Venous: 0.8 mmol/L (ref 0.5–1.9)

## 2021-05-29 LAB — PHOSPHORUS: Phosphorus: 3.1 mg/dL (ref 2.5–4.6)

## 2021-05-29 LAB — PREPARE RBC (CROSSMATCH)

## 2021-05-29 SURGERY — EGD (ESOPHAGOGASTRODUODENOSCOPY)
Anesthesia: General

## 2021-05-29 MED ORDER — SODIUM CHLORIDE 0.9 % IV SOLN: INTRAVENOUS | Status: DC

## 2021-05-29 MED ORDER — PROPOFOL 10 MG/ML IV BOLUS
INTRAVENOUS | Status: DC | PRN
  Administered 2021-05-29: 120 mg via INTRAVENOUS

## 2021-05-29 MED ORDER — IOHEXOL 350 MG/ML SOLN
100.0000 mL | Freq: Once | INTRAVENOUS | Status: AC | PRN
  Administered 2021-05-29: 100 mL via INTRAVENOUS

## 2021-05-29 MED ORDER — PHENYLEPHRINE HCL (PRESSORS) 10 MG/ML IV SOLN
INTRAVENOUS | Status: DC | PRN
  Administered 2021-05-29 (×2): 80 ug via INTRAVENOUS

## 2021-05-29 MED ORDER — FENTANYL CITRATE (PF) 100 MCG/2ML IJ SOLN
INTRAMUSCULAR | Status: DC | PRN
  Administered 2021-05-29: 50 ug via INTRAVENOUS

## 2021-05-29 MED ORDER — ROCURONIUM BROMIDE 100 MG/10ML IV SOLN
INTRAVENOUS | Status: DC | PRN
  Administered 2021-05-29: 20 mg via INTRAVENOUS

## 2021-05-29 MED ORDER — TRACE MINERALS CU-MN-SE-ZN 300-55-60-3000 MCG/ML IV SOLN
INTRAVENOUS | Status: AC
  Filled 2021-05-29: qty 676

## 2021-05-29 MED ORDER — SODIUM CHLORIDE 0.9% IV SOLUTION
Freq: Once | INTRAVENOUS | Status: AC
  Administered 2021-05-30: 200 mL via INTRAVENOUS

## 2021-05-29 MED ORDER — PHENYLEPHRINE HCL-NACL 20-0.9 MG/250ML-% IV SOLN
INTRAVENOUS | Status: DC | PRN
  Administered 2021-05-29: 40 ug/min via INTRAVENOUS

## 2021-05-29 MED ORDER — SUGAMMADEX SODIUM 200 MG/2ML IV SOLN
INTRAVENOUS | Status: DC | PRN
  Administered 2021-05-29: 200 mg via INTRAVENOUS

## 2021-05-29 MED ORDER — SUCCINYLCHOLINE CHLORIDE 200 MG/10ML IV SOSY
PREFILLED_SYRINGE | INTRAVENOUS | Status: DC | PRN
  Administered 2021-05-29: 100 mg via INTRAVENOUS

## 2021-05-29 MED ORDER — LIDOCAINE HCL (CARDIAC) PF 100 MG/5ML IV SOSY
PREFILLED_SYRINGE | INTRAVENOUS | Status: DC | PRN
  Administered 2021-05-29: 50 mg via INTRATRACHEAL

## 2021-05-29 MED ORDER — SODIUM CHLORIDE (PF) 0.9 % IJ SOLN
PREFILLED_SYRINGE | INTRAMUSCULAR | Status: DC | PRN
  Administered 2021-05-29: 2 mL

## 2021-05-29 MED ORDER — LACTATED RINGERS IV SOLN: INTRAVENOUS | Status: DC | PRN

## 2021-05-29 NOTE — Progress Notes (Signed)
Progress Note  5 Days Post-Op  Subjective: Patient feels ok this morning.  Bleeding noted from NGT noted since yesterday intermittently.  No actively bleeding noted right now.  Getting blood.  No flatus or BM yet.  Objective: Vital signs in last 24 hours: Temp:  [97.5 F (36.4 C)-98.7 F (37.1 C)] 97.5 F (36.4 C) (12/12 0835) Pulse Rate:  [81-111] 92 (12/12 0835) Resp:  [8-25] 15 (12/12 0835) BP: (94-130)/(62-91) 118/81 (12/12 0835) SpO2:  [67 %-100 %] 93 % (12/12 0835) FiO2 (%):  [0 %-21 %] 0 % (12/12 0618) Weight:  [69.9 kg] 69.9 kg (12/12 0308) Last BM Date: 06/01/21  Intake/Output from previous day: 12/11 0701 - 12/12 0700 In: 1942.2 [I.V.:897.2; Blood:805; NG/GT:40; IV Piggyback:200] Out: 9622 [Urine:200; Emesis/NG output:1050] Intake/Output this shift: Total I/O In: 65 [I.V.:65] Out: -   PE: General: NAD Abd: soft, appropriately tender, NGT in place with minimal old bloody looking output,  NGT flushed. midline incision is clean and packed.  JP drain with serous drainage   Lab Results:  Recent Labs    05/28/21 2031 05/29/21 0340  WBC 13.0* 13.4*  HGB 6.6* 6.5*  HCT 18.7* 19.0*  PLT 93* 88*   BMET Recent Labs    05/28/21 2031 05/29/21 0340  NA 137 137  K 3.6 3.6  CL 104 103  CO2 24 25  GLUCOSE 126* 111*  BUN 60* 65*  CREATININE 5.35* 5.72*  CALCIUM 8.2* 8.3*   PT/INR Recent Labs    05/29/21 0113  LABPROT 15.9*  INR 1.3*   CMP     Component Value Date/Time   NA 137 05/29/2021 0340   NA 135 05/24/2020 1119   K 3.6 05/29/2021 0340   CL 103 05/29/2021 0340   CO2 25 05/29/2021 0340   GLUCOSE 111 (H) 05/29/2021 0340   BUN 65 (H) 05/29/2021 0340   BUN 25 05/24/2020 1119   CREATININE 5.72 (H) 05/29/2021 0340   CREATININE 2.90 (H) 08/11/2015 1359   CALCIUM 8.3 (L) 05/29/2021 0340   PROT 4.6 (L) 05/29/2021 0340   PROT 7.5 05/24/2020 1119   ALBUMIN 1.5 (L) 05/29/2021 0340   ALBUMIN 4.1 05/24/2020 1119   AST 14 (L) 05/29/2021 0340   ALT  6 05/29/2021 0340   ALKPHOS 79 05/29/2021 0340   BILITOT 1.4 (H) 05/29/2021 0340   BILITOT 2.4 (H) 05/24/2020 1119   GFRNONAA 10 (L) 05/29/2021 0340   GFRNONAA 23 (L) 08/11/2015 1359   GFRAA 24 (L) 05/24/2020 1119   GFRAA 27 (L) 08/11/2015 1359   Lipase     Component Value Date/Time   LIPASE 51 05/19/2021 1445       Studies/Results: DG Abd 1 View  Result Date: 05/28/2021 CLINICAL DATA:  NG placement. EXAM: ABDOMEN - 1 VIEW COMPARISON:  Abdominal radiograph dated 05/24/2021. FINDINGS: Enteric tube with side port in the proximal stomach tip in the left upper abdomen, likely in the body of the stomach. Dilated and air distended loops of small bowel in the left abdomen measure up to 5.5 cm in diameter. These have increased in caliber compared to prior radiograph. Right upper quadrant cholecystectomy clips and a CBD stent is noted. Mixed density containing air in the left upper abdomen, likely gastric content. No definite free air. Cardiomegaly with vascular congestion. IMPRESSION: 1. Enteric tube with tip in the body of the stomach. 2. Dilated small bowel loops in the left abdomen, increased in caliber compared to prior radiograph. Electronically Signed   By: Anner Crete  M.D.   On: 05/28/2021 19:18   DG CHEST PORT 1 VIEW  Result Date: 05/28/2021 CLINICAL DATA:  Short of breath, hemoptysis EXAM: PORTABLE CHEST 1 VIEW COMPARISON:  05/25/2021 FINDINGS: Single frontal view of the chest demonstrates stable right internal jugular catheter tip overlying superior vena cava. Enteric catheter tip projects over gastric fundus. Cardiac silhouette remains enlarged. No airspace disease, effusion, or pneumothorax. Improved central vascular congestion. No acute bony abnormalities. IMPRESSION: 1. Improved central vascular congestion. 2. Support devices as above. Electronically Signed   By: Randa Ngo M.D.   On: 05/28/2021 20:57   CT Angio Abd/Pel w/ and/or w/o  Result Date: 05/29/2021 CLINICAL  DATA:  Upper GI bleed, post small bowel resection, rule out retroperitoneal bleed EXAM: CTA ABDOMEN AND PELVIS WITHOUT AND WITH CONTRAST TECHNIQUE: Multidetector CT imaging of the abdomen and pelvis was performed using the standard protocol during bolus administration of intravenous contrast. Multiplanar reconstructed images and MIPs were obtained and reviewed to evaluate the vascular anatomy. CONTRAST:  180mL OMNIPAQUE IOHEXOL 350 MG/ML SOLN COMPARISON:  05/21/2021 FINDINGS: VASCULAR Mild four-chamber cardiac enlargement. Aorta: Mild scattered calcified atheromatous plaque. No aneurysm, dissection, or stenosis. Celiac: Patent without evidence of aneurysm, dissection, vasculitis or significant stenosis. Left gastric artery arises directly from the aorta, an anatomic variant. SMA: Patent without evidence of aneurysm, dissection, vasculitis or significant stenosis. Mild calcified plaque Renals: Both renal arteries are patent without evidence of aneurysm, dissection, vasculitis, fibromuscular dysplasia or significant stenosis. Mild nonocclusive calcified plaque proximally. IMA: Patent without evidence of aneurysm, dissection, vasculitis or significant stenosis. Inflow: Nonocclusive calcified plaque in bilateral common and internal iliac arteries. No aneurysm, dissection, stenosis, or active extravasation. Proximal Outflow: Mildly atheromatous, patent. Veins: Patent hepatic veins, portal vein, SM V, bilateral renal veins, iliac venous system, and IVC. Review of the MIP images confirms the above findings. NON-VASCULAR Lower chest: Small bilateral pleural effusions. Subsegmental atelectasis/consolidation posteriorly in the lung bases, right greater than left. Hepatobiliary: Post cholecystectomy. No focal liver lesion or biliary ductal dilatation. Pancreas: Unremarkable. No pancreatic ductal dilatation or surrounding inflammatory changes. Spleen: Small with coarse calcifications Adrenals/Urinary Tract: Adrenal glands  unremarkable. Stable small renal cysts. No urolithiasis or hydronephrosis. Urinary bladder physiologically distended. Stomach/Bowel: Nasogastric tube extends into the stomach. Stomach is partially distended by gas and debris. Endoscopic clips in the proximal duodenum. Dilated loops of mid small bowel predominantly in the left abdomen, measure up to 4.3 cm diameter in the left lower quadrant. Distal loops of ileum are decompressed. No discrete transition point is identified. Surgical staple lines in the right lower quadrant. Chronic appendicoliths in the appendix without acute change. The colon is nondilated, unremarkable. Lymphatic: No abdominal or pelvic adenopathy. Reproductive: Prostate is unremarkable. Other: Small volume perihepatic ascites. Small amount of free intraperitoneal gas in the upper abdomen presumably postoperative. Musculoskeletal: Left mid abdominal surgical drain extends up to the subhepatic space. Hemostasis material in the gallbladder fossa. Coarse sclerotic changes throughout the visualized spine and in the pelvis presumably related to chronic renal disease. No fracture or worrisome bone lesion. IMPRESSION: 1. Negative for retroperitoneal hematoma or   active extravasation. 2. Dilated mid small bowel loops, decompressed distally, without discrete transition point identified. 3. Small volume perihepatic ascites, and small pleural effusions. 4.  Aortic Atherosclerosis (ICD10-170.0). Electronically Signed   By: Lucrezia Europe M.D.   On: 05/29/2021 07:50    Anti-infectives: Anti-infectives (From admission, onward)    Start     Dose/Rate Route Frequency Ordered Stop   05/24/21 1730  piperacillin-tazobactam (ZOSYN) IVPB 2.25 g        2.25 g 100 mL/hr over 30 Minutes Intravenous Every 8 hours 05/24/21 1654     05/24/21 1615  metroNIDAZOLE (FLAGYL) IVPB 500 mg       Note to Pharmacy: Please tube to #[phone]    500 mg 100 mL/hr over 60 Minutes Intravenous  Once 05/24/21 1603 05/24/21 1630         Assessment/Plan POD 5, s/p dx lap converted to ex lap for SBR and cholecystectomy for gangrenous cholecystitis and chronic ischemic segment of SB causing an obstruction, Dr. Donne Hazel 05/24/21 -[facility] -BID dressing changes -cont NGT for post op ileus -cont JP drain due to intra-op findings -mobilize as able, therapies already written for. -TNA for nutritional support while has ileus -cont zosyn, likely for 5 days   FEN: NPO, NGT to LIWS, TPN VTE: SCDs, DVT prophylaxis on hold til hgb stabilizes ID: zosyn  UGI bleed - agree with blood prn as well as EGD this afternoon.  ESRD - TTS, per renal CHF - per cards A. Fib on eliquis, on hold Anemia - hgb 6.6 this am, getting blood, per primary Acute hypoxic respiratory failure - extubated and doing well   LOS: 10 days    Henreitta Cea, [facility] 05/29/2021, 9:35 AM Please see Amion for pager number during day hours 7:00am-4:30pm

## 2021-05-29 NOTE — Progress Notes (Addendum)
Advanced Heart Failure Rounding Note  PCP-Cardiologist: Dr. Haroldine Laws   Subjective:   Echo 12/06: EF 40-45%, RV mildly reduced, RVSP 55 mmHg, severe MR, severe TR S/p lap, cholecystectomy and SB resection 12/07.  Extubated 12/8  12/11 CCM Re-consulted due bleeding.   Earlier this morning transferred to 49M due to hemoptysis and bloody NGT drainage. Hg down to 6.6. Given 1UPRBCs. CT abd. No hematoma. Dilated loops.   Hgb 6.6>6.5--> Getting 1URPBCs   Denies SOB. Not passing gas.    Objective:   Weight Range: 69.9 kg Body mass index is 21.49 kg/m.   Vital Signs:   Temp:  [97.6 F (36.4 C)-98.7 F (37.1 C)] 97.7 F (36.5 C) (12/12 0728) Pulse Rate:  [81-111] 88 (12/12 0700) Resp:  [8-22] 17 (12/12 0700) BP: (94-132)/(62-91) 126/77 (12/12 0700) SpO2:  [67 %-100 %] 67 % (12/12 0700) FiO2 (%):  [0 %-21 %] 0 % (12/12 0618) Weight:  [69.9 kg] 69.9 kg (12/12 0308) Last BM Date: 05/22/21  Weight change: Filed Weights   05/27/21 0500 05/27/21 2243 05/29/21 0308  Weight: 68.1 kg 67.9 kg 69.9 kg    Intake/Output:   Intake/Output Summary (Last 24 hours) at 05/29/2021 0752 Last data filed at 05/29/2021 0700 Gross per 24 hour  Intake 1942.21 ml  Output 1550 ml  Net 392.21 ml      Physical Exam    General:  No resp difficulty HEENT: NGT Neck: supple. no JVD. Carotids 2+ bilat; no bruits. No lymphadenopathy or thryomegaly appreciated. Cor: PMI nondisplaced. Irregular rate & rhythm. No rubs, gallops or murmurs. Lungs: clear Abdomen: soft, nontender, distended. No hepatosplenomegaly. No bruits or masses. Good bowel sounds. Extremities: no cyanosis, clubbing, rash, edema Neuro: alert & orientedx3, cranial nerves grossly intact. moves all 4 extremities w/o difficulty. Affect pleasant  Telemetry   A fib 90-100s   EKG    No new EKG to review   Labs    CBC Recent Labs    05/28/21 2031 05/29/21 0340  WBC 13.0* 13.4*  NEUTROABS 10.3*  --   HGB 6.6* 6.5*  HCT  18.7* 19.0*  MCV 90.8 88.4  PLT 93* 88*   Basic Metabolic Panel Recent Labs    05/28/21 2031 05/29/21 0340  NA 137 137  K 3.6 3.6  CL 104 103  CO2 24 25  GLUCOSE 126* 111*  BUN 60* 65*  CREATININE 5.35* 5.72*  CALCIUM 8.2* 8.3*  MG 1.9 2.2  PHOS  --  3.1   Liver Function Tests Recent Labs    05/28/21 2031 05/29/21 0340  AST 16 14*  ALT 5 6  ALKPHOS 88 79  BILITOT 1.4* 1.4*  PROT 4.7* 4.6*  ALBUMIN 1.6* 1.5*   No results for input(s): LIPASE, AMYLASE in the last 72 hours. Cardiac Enzymes No results for input(s): CKTOTAL, CKMB, CKMBINDEX, TROPONINI in the last 72 hours.  BNP: BNP (last 3 results) Recent Labs    08/01/20 2047  BNP 2,730.5*    ProBNP (last 3 results) No results for input(s): PROBNP in the last 8760 hours.   D-Dimer No results for input(s): DDIMER in the last 72 hours. Hemoglobin A1C No results for input(s): HGBA1C in the last 72 hours. Fasting Lipid Panel Recent Labs    05/29/21 0340  TRIG 48    Thyroid Function Tests No results for input(s): TSH, T4TOTAL, T3FREE, THYROIDAB in the last 72 hours.  Invalid input(s): FREET3  Other results:   Imaging    DG Abd 1 View  Result Date: 05/28/2021 CLINICAL DATA:  NG placement. EXAM: ABDOMEN - 1 VIEW COMPARISON:  Abdominal radiograph dated 05/24/2021. FINDINGS: Enteric tube with side port in the proximal stomach tip in the left upper abdomen, likely in the body of the stomach. Dilated and air distended loops of small bowel in the left abdomen measure up to 5.5 cm in diameter. These have increased in caliber compared to prior radiograph. Right upper quadrant cholecystectomy clips and a CBD stent is noted. Mixed density containing air in the left upper abdomen, likely gastric content. No definite free air. Cardiomegaly with vascular congestion. IMPRESSION: 1. Enteric tube with tip in the body of the stomach. 2. Dilated small bowel loops in the left abdomen, increased in caliber compared to  prior radiograph. Electronically Signed   By: Anner Crete M.D.   On: 05/28/2021 19:18   DG CHEST PORT 1 VIEW  Result Date: 05/28/2021 CLINICAL DATA:  Short of breath, hemoptysis EXAM: PORTABLE CHEST 1 VIEW COMPARISON:  05/25/2021 FINDINGS: Single frontal view of the chest demonstrates stable right internal jugular catheter tip overlying superior vena cava. Enteric catheter tip projects over gastric fundus. Cardiac silhouette remains enlarged. No airspace disease, effusion, or pneumothorax. Improved central vascular congestion. No acute bony abnormalities. IMPRESSION: 1. Improved central vascular congestion. 2. Support devices as above. Electronically Signed   By: Randa Ngo M.D.   On: 05/28/2021 20:57     Medications:     Scheduled Medications:  sodium chloride   Intravenous Once   arformoterol  15 mcg Nebulization BID   budesonide (PULMICORT) nebulizer solution  0.25 mg Nebulization BID   Chlorhexidine Gluconate Cloth  6 each Topical Daily   darbepoetin (ARANESP) injection - DIALYSIS  200 mcg Intravenous Q Sat-HD   HYDROmorphone   Intravenous Q4H   mouth rinse  15 mL Mouth Rinse BID   [START ON 06/01/2021] pantoprazole  40 mg Intravenous Q12H   revefenacin  175 mcg Nebulization Daily   sodium chloride flush  10-40 mL Intracatheter Q12H    Infusions:  methocarbamol (ROBAXIN) IV     pantoprazole 8 mg/hr (05/28/21 2308)   piperacillin-tazobactam (ZOSYN)  IV Stopped (05/29/21 0423)   TPN ADULT (ION) 65 mL/hr at 05/29/21 0700    PRN Medications: albuterol, hydrALAZINE, HYDROmorphone (DILAUDID) injection, methocarbamol (ROBAXIN) IV, ondansetron (ZOFRAN) IV, sodium chloride flush    Patient Profile   65 y/o chronically ill male with sickle cell dz, ESRD, chronic systolic HF EF 62%, pulmonary HTN, chronic AF and legal blindness. Admitted with recurrent SBO. We are asked to help manage AF with RVR and assess peri-operative risk.   Assessment/Plan    1. Persistent Atrial  Fibrillation/AFL - S/p DCCV 05/2018 - S/P DCCV 10/06/19 NSR - Recurrence 05/21. Has been seen in AF Clinic and rate control strategy recommended - - Holding anticoagulation due to recurrent bleedingand recent surgery    2. Chronic combined Systolic/Diastolic Heart Failure:  - NICM. Cath 05/2011 without significant coronary disease.   - ECHO 12/2015 40%.  - Echo 05/2018: EF 25-30%.  - Echo 3/21 LVEF 35-40%, low normal RV function,severe pulm HTN. Severe LAE, mod RAE, mod MR, severe TR. RV TAPSE 1.6, RV s' 10 - Echo 9/21 EF 40-45%, RV mildly reduced, mild RAE, severe pulm artery systolic pressure, severe TR - Echo this admit suggestive of restrictive CM with EF 40-45%, RV mildly decreased, severe MR, severe TR, RVSP 55 mmHg - Volume managed with HD on T,T,S - IV hydralazine PRN  - Off Entresto with ESRD  3. Anemia with H/o GI bleeding: - Prior EGD in 2019 showed nonbleeding gastritis, Colonoscopy showed small internal hemorrhoids, otherwise normal.  - GI bleed 02/22 - gastric erosions on EGD - A/C anemia 11/22 - transfused with 3 uPRBCs - A/C anemia, received 5 u PRBcs so far this admit, 2 u FFP - 12/11 started bleeding again. Hemoptysis. Given another unit of blood.  - CT of abd with dilated loops. Discussed with GI. Plan for scope today. - Received aranesp - Holding anticoagulation. May need to weigh risks/benefits of resuming anticoagulation with hx recurrent bleeding.    4. SBO/gangrenous cholecystitis - s/p lap, cholecystectomy and SB resection 12/07 - Has open abdominal wound - Remains distended.  - on zosyn   6. Pulmonary HTN  - Last echo 09/21 with EF 40-45%, RV mildly reduced, RVSP 75 mmHg, severe TR - Repeat echo this admit with EF 40-45%, RV mildly reduced, severe MR, severe TR, RVSP 55 mmHg   7. Abdominal ascites -Noted on CT -Mild hepatic nodularity present   8. HTN: - Moderately elevated, suspect pain contributing  - PRN IV hydralazine    9.  Sickle Cell:   -  Follows closely with [facility].    71. Former smoker:   - Quit 11/2015.   11. ESRD - On HD now T/T/S - Nephrology following  12. Hypokalemia - K 3.6     Length of Stay: [place], NP  05/29/2021, 7:52 AM  Advanced Heart Failure Team Pager [phone] (M-F; 7a - 5p)  Please contact [facility] for night-coverage after hours (5p -7a ) and weekends on [email or web address]  Patient seen and examined with the above-signed Advanced Practice Provider and/or Housestaff. I personally reviewed laboratory data, imaging studies and relevant notes. I independently examined the patient and formulated the important aspects of the plan. I have edited the note to reflect any of my changes or salient points. I have personally discussed the plan with the patient and/or family.  Developed massive hematemesis yesterday. Hgb dropped to 6. Moved back to ICU. Getting blood now. BP stable. Not on pressors. Not passing gas. No SOB, orthopnea or PND.   General:  Sitting up No resp difficulty HEENT: normal + NGT Neck: supple. no JVD. Carotids 2+ bilat; no bruits. No lymphadenopathy or thryomegaly appreciated. Cor: PMI nondisplaced. Irregular rate & rhythm. No rubs, gallops or murmurs. Lungs: clear Abdomen: soft, nontender, + distended. Quiet  dressing in place Extremities: no cyanosis, clubbing, rash, edema  +RUE AVF Neuro: alert & orientedx3, cranial nerves grossly intact. moves all 4 extremities w/o difficulty. Affect pleasant  He remains very tenuous. Still with post-op ileus with dilated SB loops on CT. Now with hematemesis. Pending EGD today. I spoke with Dr. Darlyn Chamber in GI. Agree with proceeding today as potential benefits outweigh risks. Continue to hold [facility]. AF rate stable.   Glori Bickers, MD  9:08 AM

## 2021-05-29 NOTE — Anesthesia Preprocedure Evaluation (Addendum)
Anesthesia Evaluation  Patient identified by MRN, date of birth, ID band Patient awake    Reviewed: Allergy & Precautions, H&P , NPO status , Patient's Chart, lab work & pertinent test results  Airway Mallampati: II   Neck ROM: full    Dental  (+) Dental Advisory Given, Poor Dentition   Pulmonary shortness of breath and with exertion, former smoker,    breath sounds clear to auscultation       Cardiovascular hypertension, +CHF   Rhythm:regular Rate:Normal  EF 40-45%. Severe MR, severe TR. LA and RA severely dilated.   Neuro/Psych  Headaches, negative psych ROS   GI/Hepatic POD 5, s/p dx lap converted to ex lap for SBR and cholecystectomy for gangrenous cholecystitis and chronic ischemic segment of SB causing an obstruction  Now w/ hematemesis    Endo/Other    Renal/GU ESRF and DialysisRenal disease  negative genitourinary   Musculoskeletal negative musculoskeletal ROS (+)   Abdominal   Peds  Hematology  (+) Blood dyscrasia, Sickle cell anemia and anemia , 6.5/19   Anesthesia Other Findings Legally blind  Reproductive/Obstetrics negative OB ROS                            Anesthesia Physical Anesthesia Plan  ASA: 4  Anesthesia Plan: General   Post-op Pain Management:    Induction: Intravenous, Cricoid pressure planned and Rapid sequence  PONV Risk Score and Plan: Ondansetron, Dexamethasone and Treatment may vary due to age or medical condition  Airway Management Planned: Oral ETT and Video Laryngoscope Planned  Additional Equipment: None  Intra-op Plan:   Post-operative Plan: Extubation in OR  Informed Consent: I have reviewed the patients History and Physical, chart, labs and discussed the procedure including the risks, benefits and alternatives for the proposed anesthesia with the patient or authorized representative who has indicated his/her understanding and acceptance.      Dental advisory given  Plan Discussed with: CRNA  Anesthesia Plan Comments:       Anesthesia Quick Evaluation

## 2021-05-29 NOTE — Progress Notes (Signed)
Patient transferred to [phone]

## 2021-05-29 NOTE — Progress Notes (Addendum)
eLink Physician-Brief Progress Note Patient Name: BREANDAN PEOPLE DOB: 1956-06-14 MRN: [id number]   Date of Service  05/29/2021  HPI/Events of Note  66M with ESRD, sickle cell, CHF, COPD, [place] admitted for recurrent SBO and choledocholithiasis. 12/7 Ex-lap. 12/8 Extubated and transferred to [facility].  Overnight on 12/11 PCCM reconsulted for bloody NGT drainage and reported hemoptysis. GI consulted DDAVP and PPI gtt ordered. Hg 6.6. PRBC x1 ordered  Camera Check: Mild tachycardia to 100s. BP 114/68. Appears comfortable in bed. No vasopressor requirement  eICU Interventions  Pending GI note/EGD NGT to LIS F/u CBC post-transfusion  6:00 AM Hg 6.6 after PRBC x 2U. Tachycardic with stable pressures - STAT CT Angio A/P and PRBC x 1 U ordered     Intervention Category Evaluation Type: New Patient Evaluation  Toyoko Silos Rodman Pickle 05/29/2021, 3:59 AM

## 2021-05-29 NOTE — Progress Notes (Signed)
Progress Note   Subjective  65 year old male known to our service from prior consultation over a week ago, history of end-stage renal disease, CHF, COPD, pulmonary hypertension, sickle cell anemia, chronic A. fib on Eliquis, history of small bowel obstruction and choledocholithiasis, admitted on 12 2 with recurrent SBO.  On December 7 he had surgery for small bowel resection of SBO in the jejunum as well as cholecystectomy for history of choledocholithiasis.  This is an ex lap with open abdomen, had required some pressors and antibiotics, intubated post procedure.  Extubated on 12/8.  Recall previously that he was admitted at [facility] with SBO and choledocholithiasis at the end of November, he had an ERCP with removal of stones at that time and stent placement.  Yesterday the patient was noted to have some bloody drainage from the NG tube.  He states he felt that he regurgitated some gastric contents into his mouth and spit it out and there was blood and clots in it.  Per review of the note he had several hundred cc of bloody output in his canister yesterday.    He was placed on IV Protonix.  His hemoglobin was 6.6 yesterday with the symptoms, was given 2 units of RBC overnight and his hemoglobin remains at 6.6.  He denies any changes in his abdomen in regards to pain, he is on a PCA postoperatively.  He has not had any blood per rectum that he endorses or nursing has seen.  His heart rate was mild tachycardia into the low 100s overnight.  His blood pressure is 120s over 80s and heart rate ranging from 90s to low 100s this morning.  NG-tube remains in place, he states he is otherwise feeling at baseline.  Scheduled to have another unit of RBC transfusion this morning.  He had a CTA of the abdomen pelvis this morning which was negative for active extravasation.  He does have some dilated mid small bowel loops with distal decompression without focal transition point.  His liver appears okay on  imaging without overt cirrhosis, surgery was notable for questionable changes of cirrhosis.  He has not been on Eliquis recently on what I can review in the chart.   Objective   Vital signs in last 24 hours: Temp:  [97.6 F (36.4 C)-98.7 F (37.1 C)] 97.7 F (36.5 C) (12/12 0728) Pulse Rate:  [81-111] 88 (12/12 0700) Resp:  [8-22] 17 (12/12 0700) BP: (94-130)/(62-91) 126/77 (12/12 0700) SpO2:  [67 %-100 %] 67 % (12/12 0700) FiO2 (%):  [0 %-21 %] 0 % (12/12 0618) Weight:  [69.9 kg] 69.9 kg (12/12 0308) Last BM Date: 05/22/21 General:    AA male in NAD, NG in place Heart:  atrial fibrillation, rate 90s Lungs: Respirations even and unlabored,  Abdomen:  distended, denies pain, post op changes.  Extremities:  Without edema. Neurologic:  Alert and oriented,  grossly normal neurologically. Psych:  Cooperative. Normal mood and affect.  Intake/Output from previous day: 12/11 0701 - 12/12 0700 In: 1942.2 [I.V.:897.2; Blood:805; NG/GT:40; IV Piggyback:200] Out: 2979 [Urine:200; Emesis/NG output:1050] Intake/Output this shift: No intake/output data recorded.  Lab Results: Recent Labs    05/28/21 2000 05/28/21 2031 05/29/21 0340  WBC 13.2* 13.0* 13.4*  HGB 6.6* 6.6* 6.5*  HCT 18.7* 18.7* 19.0*  PLT 92* 93* 88*   BMET Recent Labs    05/28/21 0655 05/28/21 2031 05/29/21 0340  NA 134* 137 137  K 4.8 3.6 3.6  CL 101 104 103  CO2 21* 24 25  GLUCOSE 115* 126* 111*  BUN 48* 60* 65*  CREATININE 4.73* 5.35* 5.72*  CALCIUM 8.4* 8.2* 8.3*   LFT Recent Labs    05/28/21 2031 05/29/21 0340  PROT 4.7* 4.6*  ALBUMIN 1.6* 1.5*  AST 16 14*  ALT 5 6  ALKPHOS 88 79  BILITOT 1.4* 1.4*  BILIDIR 0.5*  --   IBILI 0.9  --    PT/INR Recent Labs    05/29/21 0113  LABPROT 15.9*  INR 1.3*    Studies/Results: DG Abd 1 View  Result Date: 05/28/2021 CLINICAL DATA:  NG placement. EXAM: ABDOMEN - 1 VIEW COMPARISON:  Abdominal radiograph dated 05/24/2021. FINDINGS: Enteric tube  with side port in the proximal stomach tip in the left upper abdomen, likely in the body of the stomach. Dilated and air distended loops of small bowel in the left abdomen measure up to 5.5 cm in diameter. These have increased in caliber compared to prior radiograph. Right upper quadrant cholecystectomy clips and a CBD stent is noted. Mixed density containing air in the left upper abdomen, likely gastric content. No definite free air. Cardiomegaly with vascular congestion. IMPRESSION: 1. Enteric tube with tip in the body of the stomach. 2. Dilated small bowel loops in the left abdomen, increased in caliber compared to prior radiograph. Electronically Signed   By: Anner Crete M.D.   On: 05/28/2021 19:18   DG CHEST PORT 1 VIEW  Result Date: 05/28/2021 CLINICAL DATA:  Short of breath, hemoptysis EXAM: PORTABLE CHEST 1 VIEW COMPARISON:  05/25/2021 FINDINGS: Single frontal view of the chest demonstrates stable right internal jugular catheter tip overlying superior vena cava. Enteric catheter tip projects over gastric fundus. Cardiac silhouette remains enlarged. No airspace disease, effusion, or pneumothorax. Improved central vascular congestion. No acute bony abnormalities. IMPRESSION: 1. Improved central vascular congestion. 2. Support devices as above. Electronically Signed   By: Randa Ngo M.D.   On: 05/28/2021 20:57   CT Angio Abd/Pel w/ and/or w/o  Result Date: 05/29/2021 CLINICAL DATA:  Upper GI bleed, post small bowel resection, rule out retroperitoneal bleed EXAM: CTA ABDOMEN AND PELVIS WITHOUT AND WITH CONTRAST TECHNIQUE: Multidetector CT imaging of the abdomen and pelvis was performed using the standard protocol during bolus administration of intravenous contrast. Multiplanar reconstructed images and MIPs were obtained and reviewed to evaluate the vascular anatomy. CONTRAST:  159mL OMNIPAQUE IOHEXOL 350 MG/ML SOLN COMPARISON:  05/21/2021 FINDINGS: VASCULAR Mild four-chamber cardiac  enlargement. Aorta: Mild scattered calcified atheromatous plaque. No aneurysm, dissection, or stenosis. Celiac: Patent without evidence of aneurysm, dissection, vasculitis or significant stenosis. Left gastric artery arises directly from the aorta, an anatomic variant. SMA: Patent without evidence of aneurysm, dissection, vasculitis or significant stenosis. Mild calcified plaque Renals: Both renal arteries are patent without evidence of aneurysm, dissection, vasculitis, fibromuscular dysplasia or significant stenosis. Mild nonocclusive calcified plaque proximally. IMA: Patent without evidence of aneurysm, dissection, vasculitis or significant stenosis. Inflow: Nonocclusive calcified plaque in bilateral common and internal iliac arteries. No aneurysm, dissection, stenosis, or active extravasation. Proximal Outflow: Mildly atheromatous, patent. Veins: Patent hepatic veins, portal vein, SM V, bilateral renal veins, iliac venous system, and IVC. Review of the MIP images confirms the above findings. NON-VASCULAR Lower chest: Small bilateral pleural effusions. Subsegmental atelectasis/consolidation posteriorly in the lung bases, right greater than left. Hepatobiliary: Post cholecystectomy. No focal liver lesion or biliary ductal dilatation. Pancreas: Unremarkable. No pancreatic ductal dilatation or surrounding inflammatory changes. Spleen: Small with coarse calcifications Adrenals/Urinary Tract: Adrenal glands unremarkable.  Stable small renal cysts. No urolithiasis or hydronephrosis. Urinary bladder physiologically distended. Stomach/Bowel: Nasogastric tube extends into the stomach. Stomach is partially distended by gas and debris. Endoscopic clips in the proximal duodenum. Dilated loops of mid small bowel predominantly in the left abdomen, measure up to 4.3 cm diameter in the left lower quadrant. Distal loops of ileum are decompressed. No discrete transition point is identified. Surgical staple lines in the right lower  quadrant. Chronic appendicoliths in the appendix without acute change. The colon is nondilated, unremarkable. Lymphatic: No abdominal or pelvic adenopathy. Reproductive: Prostate is unremarkable. Other: Small volume perihepatic ascites. Small amount of free intraperitoneal gas in the upper abdomen presumably postoperative. Musculoskeletal: Left mid abdominal surgical drain extends up to the subhepatic space. Hemostasis material in the gallbladder fossa. Coarse sclerotic changes throughout the visualized spine and in the pelvis presumably related to chronic renal disease. No fracture or worrisome bone lesion. IMPRESSION: 1. Negative for retroperitoneal hematoma or   active extravasation. 2. Dilated mid small bowel loops, decompressed distally, without discrete transition point identified. 3. Small volume perihepatic ascites, and small pleural effusions. 4.  Aortic Atherosclerosis (ICD10-170.0). Electronically Signed   By: Lucrezia Europe M.D.   On: 05/29/2021 07:50       Assessment / Plan:    65 year old male with complicated course as outlined above - ERCP a few weeks ago with stent placement and removal of gallstones, small bowel obstruction status post resection of jejunum on 12/7 and cholecystectomy, now presenting with upper tract GI bleeding.  Has had a fair amount of bloody output from the NG and had hematemesis yesterday.  No melena, small bowel is somewhat distended on CTA but no active extravasation.  Despite 2 units of RBC transfusion his hemoglobin has not changed.  He appears hemodynamically stable at this time.  Dr. Rush Landmark discussed his case with the surgical team overnight and felt his anastomosis was distal enough that the patient could tolerate EGD/enteroscopy to evaluate this.  I discussed his case with Dr. Haroldine Laws of cardiology, the patient was given 20-30% mortality risk with his prior surgery and did well with that, he feels patient would be okay to proceed with endoscopy  today.  Discussed the situation with the patient. Discussed ddx for upper tract bleeding with the patient to include common causes as well as post sphincterotomy related bleeding which is also possible given his prior ERCP, discussed options with the patient, he is currently on IV Protonix.  We will plan for blood transfusion this morning and hopefully improve his hemoglobin prior to anesthesia.  Hopefully EGD later this afternoon if patient is stable for it.  If any significant bleeding in the interim or hemodynamic changes please contact me, not much output from the NG tube this morning, nursing staff reports output has lessened.  I discussed the risks and benefits of endoscopy and anesthesia with the patient and he is agreeable to proceed with this. I tried contacting his cousin Caren Griffins who is his point of contact however she was not available, will try later today. Otherwise continue NPO, await blood transfusion, continue PPI, hopefully EGD / enteroscopy later today.  Plan: - NPO - continue IV protonix - RBC transfusion this AM - tentative EGD / enteroscopy later today  Jolly Mango, MD [facility]

## 2021-05-29 NOTE — Progress Notes (Signed)
PT Cancellation Note  Patient Details Name: Patrick [name] MRN: [id number] DOB: 06/29/1955   Cancelled Treatment:    Reason Eval/Treat Not Completed: Patient at procedure or test/unavailable.  Pt just left for an EGD.  Will check back later as able. 05/29/2021  Ginger Carne., PT Acute Rehabilitation Services [phone]  (pager) [phone]  (office)   [name] 05/29/2021, 3:18 PM

## 2021-05-29 NOTE — TOC Progression Note (Signed)
Transition of Care [facility]) - Progression Note    Patient Details  Name: MARVELL TAMER MRN: [id number] Date of Birth: 20-Jul-1955  Transition of Care [facility]) CM/SW Contact  Rana Hochstein, LCSW Phone Number: 05/29/2021, 3:28 PM  Clinical Narrative:    NG tube still in therefore HF CSW cannot fax out [facility] for SNF placement yet.   CSW will continue to follow throughout discharge.   Expected Discharge Plan: [place] Barriers to Discharge: Continued Medical Work up  Expected Discharge Plan and Services Expected Discharge Plan: [place] In-house Referral: Clinical Social Work     Living arrangements for the past 2 months: Single Family Home                                       Social Determinants of Health (SDOH) Interventions    Readmission Risk Interventions Readmission Risk Prevention Plan 08/05/2020  Transportation Screening Complete  PCP or Specialist Appt within 3-5 Days Complete  HRI or [place] Complete  Social Work Consult for [place] Planning/Counseling Complete  Palliative Care Screening Not Applicable  Medication Review [employer]) Complete  Some recent data might be hidden   [place], MSW, LCSWA [phone] Heart Failure Social Worker

## 2021-05-29 NOTE — Progress Notes (Addendum)
NAME:  Patrick [name], MRN:  [id number], DOB:  1955-10-21, LOS: 71 ADMISSION DATE:  05/19/2021, CONSULTATION DATE:  12/7 REFERRING MD:  Hal Hope,  Reason for Consult: Blood from OGT  History of Present Illness:  Patient is a 65 year old male with pertinent PMH ESRD (dialysis Tuesday, Thursday, Saturday), chronic systolic CHF, COPD, pulm hypertension, sickle cell anemia, chronic A. fib on Eliquis, SBO, choledocholithiasis presents to [facility] on 12/2 with recurrent SBO.  On 05/09/2021, patient was hospitalized at [facility] with SBO and choledocholithiasis.  SBO resolved with conservative measures and choledocholithiasis improved with ERCP and stent placement.  Patient's abdominal pain continue to worsen and was admitted to [facility] on 12/2 which showed recurrent SBO and choledocholithiasis.  Patient hemoglobin low and stopped home Eliquis.  NG tube placed and made NPO. On 12/7 patient received surgery for SBO and choledocholithiasis.  Patient received ex lap, surgical resection of SBO and cholecystectomy.  Patient remained on vaso and remained intubated postprocedure. He was extubated on 12/8 and transferred to [facility] on 12/9.  On 12/11 the patient was noted to have persistent bloody drainge from the NGT, with the patient reportedly coughing up some blood. Dr. Hal Hope spoke with Dr. Lanny Hurst (GS) who ordered DDAVP and Dr. Rush Landmark, the on-call gastroenterologist who recommended starting the patient on a protonix infusion with plans to see for EGD. Infusion was started. Hemoglobin resulted at 6.6. 1 U PRBC was ordered.   Pertinent  Medical History  Sickle cell anemia, afib on eliquis, combined CHF, ESRF on HD TTS, recently hospitalized in [place] for Choledocholithiasis w recent ERCP/Sphinct/CBD and PD stenting/stone extraction (declied cholecystectomy)  Significant Hospital Events: Including procedures, antibiotic start and stop dates in addition to other pertinent events   12/2: admitted  to [facility] for bowel obstruction 12/06:  ECHO EF 40-45%, RV mildly reduced, RVSP 55 mmHg, severe MR, severe TR 12/7: surgery bowel resection and drain placement; arrived to   ICU intubated post procedure 12/11 PCCM consult. 1 U pRBC  Interim History / Subjective:  Events as above. Patient remains in ICU with plans for scope later today.   Objective   Blood pressure 118/81, pulse 92, temperature (!) 97.5 F (36.4 C), temperature source Oral, resp. rate 15, height 5\' 11"  (1.803 m), weight 69.9 kg, SpO2 93 %.    FiO2 (%):  [0 %-21 %] 0 %   Intake/Output Summary (Last 24 hours) at 05/29/2021 0923 Last data filed at 05/29/2021 0800 Gross per 24 hour  Intake 2007.23 ml  Output 1550 ml  Net 457.23 ml   Filed Weights   05/27/21 0500 05/27/21 2243 05/29/21 0308  Weight: 68.1 kg 67.9 kg 69.9 kg    Examination: General: adult male, lying in bed  HEENT: NG tube in place with dark red output  Neuro: alert, oriented, follows commands  CV: Irregular, no mRG, HR 96  PULM:  clear breath sounds, no use of accessory muscles  GI: distended, bsx4 hypoactive, NGT in place with bloody output and some dark clots. JP drain with scant blood. Extremities: -edema, dry BLE Skin: Midline dressing CDI,  no rashes or lesions noted  Radiology  CXR 12/11 improved compared to prior film. No pneumo or effusion KUB 12/11 Dialated small bowel loops, no definite free air.  Resolved Hospital Problem list   Acute respiratory failure Hypotension  Assessment & Plan:    SBO: s/p ex lap and surgical resection 12/7. POD 4 Gangrenous cholecystitis: 12/7 cholecystectomy; recent Choledocholithiasis s/p recent ERCP stent placement at [place]  Chronic small bowel ischemia Post op-ileus Bleeding from NGT Tmax 98.7, WBC 11.7>13.2 suspect secondary to surgery, normotensive. Unclear source of bleeding. Does not appear symptomatic at this time. No lactic elevation. No hypotension. -s/p DDAVP 12/11   Plan -NPO. -Surgery following -Gastro consulted by hospitalist. To see patient. Follow up recs -Continue protonix GTT  -Continue Zosyn.  -Continue TPN -Continue NGT to LIS  ESRD: on HD tues, thurs, sat Off schedule per neph. Next treatment 12/12 Plan -Nephrology following -Ensure renal perfusion. Goal MAP 65 or greater. -Avoid neprotoxic drugs as possible. -Strict I&O's -Trend BMP -Replace electrolytes as indicated   Acute Blood Loss Anemia   - Prior EGD in 2019 showed nonbleeding gastritis, Colonoscopy showed small internal hemorrhoids, otherwise normal Chronic Anemia: sickle cell and CKD Thrombocytopenia HBG 6.6. PLT 92, secondary to bleeding s/p 1 units RBC  Repeat 6.5, given 1 additional RBC  Plan -Trend CBC, Obtain Coags -Transfuse PRBC if HBG less than 7 -Monitor for signs of bleeding. Monitor NGT output  HX COPD: takes incruse and ellipta at home Plan: -Continue brovana, yupelri, and pulmicort -Pulm toilet as able -Goal O2 sat 88-96. (Currently on room air)   Chronic systolic CHF: echo 3785 EF of 40 to 45% with mild right ventricular dysfunction and severe biatrial enlargement and severe mitral regurg and tricuspid regurg Afib RVR: on eliquis at home- rate controlled Pulm HTN Plan: -Continue holding eliquis due to recent surgery and suspected active bleeding -Heart Failure following -Holding home Hydralazine, Imdur, Torsemide given borderline hypotension   Sickle cell disease Plan: -Continue PCA  Protein calorie malnutrition Plan: -Continue TPN  Best Practice (right click and "Reselect all SmartList Selections" daily)   Diet/type: NPO DVT prophylaxis: SCD GI prophylaxis: PPI Lines: Right Subclavian tunneled cath Foley:  N/A and Yes, and it is still needed Code Status:  full code Last date of multidisciplinary goals of care discussion [pending]  Labs   CBC: Recent Labs  Lab 05/23/21 0131 05/24/21 0400 05/24/21 1541 05/27/21 0600  05/28/21 1200 05/28/21 2000 05/28/21 2031 05/29/21 0340  WBC 9.0 8.8   < > 12.5* 11.7* 13.2* 13.0* 13.4*  NEUTROABS 6.6 6.8  --   --   --   --  10.3*  --   HGB 7.8* 7.5*   < > 8.0* 6.6* 6.6* 6.6* 6.5*  HCT 22.5* 21.9*   < > 22.8* 19.0* 18.7* 18.7* 19.0*  MCV 100.0 100.0   < > 91.9 94.1 89.9 90.8 88.4  PLT 193 174   < > 120* 109* 92* 93* 88*   < > = values in this interval not displayed.    Basic Metabolic Panel: Recent Labs  Lab 05/23/21 0131 05/24/21 0400 05/24/21 1541 05/25/21 0609 05/26/21 0508 05/27/21 0751 05/28/21 0655 05/28/21 2031 05/29/21 0340  NA 136 138   < > 134* 134* 134* 134* 137 137  K 3.9 3.9   < > 3.6 3.3* 3.6 4.8 3.6 3.6  CL 95* 95*   < > 98 96* 100 101 104 103  CO2 27 29   < > 25 24 27  21* 24 25  GLUCOSE 160* 108*   < > 210* 102* 116* 115* 126* 111*  BUN 75* 81*   < > 30* 47* 29* 48* 60* 65*  CREATININE 5.89* 7.06*   < > 3.69* 4.71* 3.44* 4.73* 5.35* 5.72*  CALCIUM 8.6* 8.6*   < > 8.0* 8.3* 8.6* 8.4* 8.2* 8.3*  MG 2.4 2.6*  --  1.9 2.1  --   --  1.9 2.2  PHOS 5.3* 6.2*  --  4.1 4.1  --   --   --  3.1   < > = values in this interval not displayed.   GFR: Estimated Creatinine Clearance: 13.1 mL/min (A) (by C-G formula based on SCr of 5.72 mg/dL (H)). Recent Labs  Lab 05/28/21 1200 05/28/21 2000 05/28/21 2031 05/28/21 2331 05/29/21 0340  WBC 11.7* 13.2* 13.0*  --  13.4*  LATICACIDVEN  --   --  0.7 0.8  --     Liver Function Tests: Recent Labs  Lab 05/23/21 0131 05/24/21 0400 05/25/21 0609 05/28/21 2031 05/29/21 0340  AST 33 35 32 16 14*  ALT 16 18 16 5 6   ALKPHOS 168* 163* 110 88 79  BILITOT 2.0* 2.3* 1.5* 1.4* 1.4*  PROT 5.9* 6.0* 4.8* 4.7* 4.6*  ALBUMIN 2.4* 2.4* 1.9* 1.6* 1.5*   No results for input(s): LIPASE, AMYLASE in the last 168 hours.  No results for input(s): AMMONIA in the last 168 hours.  ABG    Component Value Date/Time   PHART 7.523 (H) 05/24/2021 1756   PCO2ART 40.1 05/24/2021 1756   PO2ART 571 (H) 05/24/2021  1756   HCO3 33.0 (H) 05/24/2021 1756   TCO2 34 (H) 05/24/2021 1756   ACIDBASEDEF 9.0 (H) 06/11/2011 0958   O2SAT 100.0 05/24/2021 1756     Coagulation Profile: Recent Labs  Lab 05/24/21 1855 05/29/21 0113  INR 1.2 1.3*    Cardiac Enzymes: No results for input(s): CKTOTAL, CKMB, CKMBINDEX, TROPONINI in the last 168 hours.  HbA1C: Hgb A1c MFr Bld  Date/Time Value Ref Range Status  05/25/2021 01:35 PM 4.9 4.8 - 5.6 % Final    Comment:    (NOTE) **Verified by repeat analysis**         Prediabetes: 5.7 - 6.4         Diabetes: >6.4         Glycemic control for adults with diabetes: <7.0   06/07/2011 04:03 PM <4.0 <5.7 % Final    Comment:    (NOTE)                                                                       According to the ADA Clinical Practice Recommendations for 2011, when HbA1c is used as a screening test:  >=6.5%   Diagnostic of Diabetes Mellitus           (if abnormal result is confirmed) 5.7-6.4%   Increased risk of developing Diabetes Mellitus References:Diagnosis and Classification of Diabetes Mellitus,Diabetes XAJO,8786,76(HMCNO 1):S62-S69 and Standards of Medical Care in         Diabetes - 2011,Diabetes BSJG,2836,62 (Suppl 1):S11-S61. Result repeated and verified. Suggest recollect if clinically inducated.    CBG: Recent Labs  Lab 05/28/21 0817 05/28/21 1306 05/28/21 1657 05/28/21 2031 05/28/21 2345  GLUCAP 111* 120* 120* 133* 134*    Review of Systems:   +ABD pain, denies nausea      Critical care time: 32 minutes    Hayden Pedro, AGACNP-BC [facility]  Pgr: [phone]  PCCM Pgr: [phone]

## 2021-05-29 NOTE — Interval H&P Note (Signed)
History and Physical Interval Note:  05/29/2021 2:53 PM  Patrick [name]  has presented today for surgery, with the diagnosis of Hematemesis, recent SBO s/p resection, recent ERCP with sphincterotomy to evaluate for source of bleeding.  The various methods of treatment have been discussed with the patient and family. After consideration of risks, benefits and other options for treatment, the patient has consented to  Procedure(s): ENTEROSCOPY (N/A) as a surgical intervention.  The patient's history has been reviewed, patient examined, no change in status, stable for surgery.  I have reviewed the patient's chart and labs.  Questions were answered to the patient's satisfaction.     I am planning to start with ERCP scope, if PD stent still in place I will remove that as well as get a very good look at sphicnterotomy site. May need to use a gastroscope, enteroscope as well.    Milus Banister

## 2021-05-29 NOTE — Anesthesia Procedure Notes (Signed)
Procedure Name: Intubation Date/Time: 05/29/2021 3:24 PM Performed by: Lavell Luster, CRNA Pre-anesthesia Checklist: Patient identified, Emergency Drugs available, Suction available, Patient being monitored and Timeout performed Patient Re-evaluated:Patient Re-evaluated prior to induction Oxygen Delivery Method: Circle system utilized Preoxygenation: Pre-oxygenation with 100% oxygen Induction Type: IV induction, Cricoid Pressure applied and Rapid sequence Laryngoscope Size: Mac, 4 and Glidescope Grade View: Grade I Tube type: Oral Tube size: 7.5 mm Number of attempts: 1 Airway Equipment and Method: Rigid stylet Placement Confirmation: ETT inserted through vocal cords under direct vision, positive ETCO2 and breath sounds checked- equal and bilateral Secured at: 22 cm Tube secured with: Tape Dental Injury: Teeth and Oropharynx as per pre-operative assessment

## 2021-05-29 NOTE — Op Note (Signed)
[facility] Patient Name: Patrick [name] Procedure Date : 05/29/2021 MRN: [id number] Attending MD: Milus Banister , MD Date of Birth: March 28, 1956 CSN: [id number] Age: 65 Admit Type: Inpatient Procedure:                Upper GI endoscopy Indications:              Melena; ERCP in [place] 2 weeks ago for CBD                            stone; s/p biliary sphincterotomy, removal of stone                            and stone debris, placement of PD stent and biliary                            stent (not sure why CBD stent placed since stones                            were removed, none remained and no strictures                            noted), since then x lap for gangrenous                            cholecystititis and resection of ischemic small                            bowel. Providers:                Grace Isaac, RN, Lodema Hong Technician,                            Technician, Cletis Athens, Technician, Milus Banister, MD Referring MD:              Medicines:                General Anesthesia Complications:            No immediate complications. Estimated blood loss:                            None. Estimated Blood Loss:     Estimated blood loss: none. Procedure:                Pre-Anesthesia Assessment:                           - Prior to the procedure, a History and Physical                            was performed, and patient medications and                            allergies were reviewed. The  patient's tolerance of                            previous anesthesia was also reviewed. The risks                            and benefits of the procedure and the sedation                            options and risks were discussed with the patient.                            All questions were answered, and informed consent                            was obtained. Prior Anticoagulants: The patient has                             taken no previous anticoagulant or antiplatelet                            agents. ASA Grade Assessment: III - A patient with                            severe systemic disease. After reviewing the risks                            and benefits, the patient was deemed in                            satisfactory condition to undergo the procedure.                           After obtaining informed consent, the endoscope was                            passed under direct vision. Throughout the                            procedure, the patient's blood pressure, pulse, and                            oxygen saturations were monitored continuously. The                            [id number]-[id number] ([id number]) Olympus duodenoscope was                            introduced through the mouth and advanced to the                            second part of duodenum. NG tube was removed prior  to start of the procedure. The [id number]-[id number] ([id number])                            Olympus endoscope was introduced through the and                            advanced to the. After obtaining informed consent,                            the endoscope was passed under direct vision.                            Throughout the procedure, the patient's blood                            pressure, pulse, and oxygen saturations were                            monitored continuously. The upper GI endoscopy was                            accomplished without difficulty. The patient                            tolerated the procedure well. Scope In: Scope Out: Findings:      There was old blood clot and some fresh red blood in the stomach. The       blood precluded visualization of about 1/2 of the proximal stomach.      Ulcerative esophagitis, LA Grade C.      An atively oozing, slightly raised 91mm AVM was found along the lesser       curvature of the stomach, about mid-body. With flushing this lesion bled        quite briskly. I treated it with application of APC, followed by       placement of 3 "360" endoclips. After this there was still some very       slight oozing of blood and so I injected 2.5cc of dilute epinephrine at       the site with complete hemostasis.      Two previously placed plastic stents were seen at the major papilla (one       biliary, one pancreatic). I removed them both with a snare. Impression:               - LA Grade C ulcerative esophagitis.                           - Actively bleeding gastric body AVM, treated with                            APC, clip placement and injection of dilute                            epinephine.                           -  Given his ileus, a new NG tube was placed                            following this procedure.                           - Two previously placed plastic stents were seen at                            the major papilla (one biliary, one pancreatic). I                            removed them both with a snare. Recommendation:           - IV PPI BID                           - Follow for rebleeding, transfuse PRN. Procedure Code(s):        --- Professional ---                           [phone], Esophagogastroduodenoscopy, flexible,                            transoral; with removal of foreign body(s) Diagnosis Code(s):        --- Professional ---                           Z46.59, Encounter for fitting and adjustment of                            other gastrointestinal appliance and device                           K92.1, Melena (includes Hematochezia) CPT copyright 2019 [employer]. All rights reserved. The codes documented in this report are preliminary and upon coder review may  be revised to meet current compliance requirements. Milus Banister, MD 05/29/2021 4:23:04 PM This report has been signed electronically. Number of Addenda: 0

## 2021-05-29 NOTE — Progress Notes (Signed)
Subjective: seen in room, no new c/o's, NG tube still in  Objective Vital signs in last 24 hours: Vitals:   05/29/21 1100 05/29/21 1101 05/29/21 1200 05/29/21 1201  BP: 129/75  120/83   Pulse: 90  93   Resp:   11 19  Temp:  97.6 F (36.4 C)    TempSrc:  Oral    SpO2:   98% 98%  Weight:      Height:       Weight change: 2 kg  Physical Exam: General: thin chronically ill male, on room air Heart: RRR no MRG noted Lungs: coarse BS b/l Abdomen: incisions bandaged Extremities: No pedal edema  dialysis Access: RUA aVF+ bruit  OP HD: TTS AF   4h  400/500   65kg  2/2 bath  P2  AVF  Hep none - Sensipar 30mg  PO q HD - Venofer 50mg  IV weekly - Mircera 259mcg IV q 2 weeks (last 11/8)   Assessment/ Plan SBO/ recent gallstone with CBD &pancreatic duct stenting: s/p ex lap here on 12/07 w/ cholecystectomy (for gangrenous cholecystitis) and SB resection (for ischemia). Remains on TPN.  No return of bowel function yet ESRD: HD today off schedule.  HTN/volume: BP stable, +5kg, UF goal 2-3L today Anemia of ESRD and history of sickle cell- Aranesp with HD tomorrow.  Check Fe values, doubt low  Metabolic bone disease: Ca/Phos ok - resume binders when eating  Nutrition: on TPN,+ NGT, will order supplements when cleared to eat.  A-fib: per cardiology   Kelly Splinter, MD 05/29/2021, 12:49 PM      Labs: Basic Metabolic Panel: Recent Labs  Lab 05/25/21 0609 05/26/21 0508 05/27/21 0751 05/28/21 0655 05/28/21 2031 05/29/21 0340  NA 134* 134*   < > 134* 137 137  K 3.6 3.3*   < > 4.8 3.6 3.6  CL 98 96*   < > 101 104 103  CO2 25 24   < > 21* 24 25  GLUCOSE 210* 102*   < > 115* 126* 111*  BUN 30* 47*   < > 48* 60* 65*  CREATININE 3.69* 4.71*   < > 4.73* 5.35* 5.72*  CALCIUM 8.0* 8.3*   < > 8.4* 8.2* 8.3*  PHOS 4.1 4.1  --   --   --  3.1   < > = values in this interval not displayed.    Liver Function Tests: Recent Labs  Lab 05/25/21 0609 05/28/21 2031 05/29/21 0340  AST 32 16  14*  ALT 16 5 6   ALKPHOS 110 88 79  BILITOT 1.5* 1.4* 1.4*  PROT 4.8* 4.7* 4.6*  ALBUMIN 1.9* 1.6* 1.5*    No results for input(s): LIPASE, AMYLASE in the last 168 hours.  No results for input(s): AMMONIA in the last 168 hours. CBC: Recent Labs  Lab 05/23/21 0131 05/24/21 0400 05/24/21 1541 05/27/21 0600 05/28/21 1200 05/28/21 2000 05/28/21 2031 05/29/21 0340  WBC 9.0 8.8   < > 12.5* 11.7* 13.2* 13.0* 13.4*  NEUTROABS 6.6 6.8  --   --   --   --  10.3*  --   HGB 7.8* 7.5*   < > 8.0* 6.6* 6.6* 6.6* 6.5*  HCT 22.5* 21.9*   < > 22.8* 19.0* 18.7* 18.7* 19.0*  MCV 100.0 100.0   < > 91.9 94.1 89.9 90.8 88.4  PLT 193 174   < > 120* 109* 92* 93* 88*   < > = values in this interval not displayed.    Cardiac Enzymes:  No results for input(s): CKTOTAL, CKMB, CKMBINDEX, TROPONINI in the last 168 hours. CBG: Recent Labs  Lab 05/28/21 0817 05/28/21 1306 05/28/21 1657 05/28/21 2031 05/28/21 2345  GLUCAP 111* 120* 120* 133* 134*      Medications:  methocarbamol (ROBAXIN) IV     pantoprazole 8 mg/hr (05/29/21 1203)   piperacillin-tazobactam (ZOSYN)  IV 2.25 g (05/29/21 1008)   TPN ADULT (ION) 65 mL/hr at 05/29/21 0800   TPN ADULT (ION)      sodium chloride   Intravenous Once   arformoterol  15 mcg Nebulization BID   budesonide (PULMICORT) nebulizer solution  0.25 mg Nebulization BID   Chlorhexidine Gluconate Cloth  6 each Topical Daily   darbepoetin (ARANESP) injection - DIALYSIS  200 mcg Intravenous Q Sat-HD   HYDROmorphone   Intravenous Q4H   mouth rinse  15 mL Mouth Rinse BID   [START ON 06/01/2021] pantoprazole  40 mg Intravenous Q12H   revefenacin  175 mcg Nebulization Daily   sodium chloride flush  10-40 mL Intracatheter Q12H

## 2021-05-29 NOTE — Progress Notes (Signed)
PHARMACY - TOTAL PARENTERAL NUTRITION CONSULT NOTE  Indication: Small bowel obstruction  Patient Measurements: Height: 5\' 11"  (180.3 cm) Weight: 69.9 kg (154 lb 1.6 oz) IBW/kg (Calculated) : 75.3 TPN AdjBW (KG): 67.9 Body mass index is 21.49 kg/m. Usual Weight: 154 lb 02/28/21  Assessment:  36 yom with ESRD admitted with worsening abdominal pain, found to have high-grade SBO. He had recent admission at [facility] for SBO resolved with conservative management; also had choledocholithiasis s/p ERCP with stone extraction, stent to CBD and PD. Lap chole recommended but declined. Surgery reports no flatus or BM except with enema on 12/5 and unlikely to resolve without surgical intervention. Given prolonged NPO status, pharmacy consulted for TPN for nutritional support. At risk for refeeding syndrome.  Glucose / Insulin: no hx DM, CBGs controlled. SSI d/c'd 12/10 Electrolytes: K 3.6 (Renal using high-K HD bath; goal >/= 4 for Afib/ileus), Phos down to 3.1 with no Phos in TPN since 12/6, others stable WNL Renal: ESRD on HD TTS - off schedule. Next planned 12/12 Hepatic: LFTs / TG WNL, tbili down to 1.4 (no evidence of jaundice per RN 12/7), albumin 1.5 Intake / Output; MIVF: UOP 259mL, NG 1055mL, drain 89mL GI Imaging:  12/3 Abd XR: Probable persistent SBO not well evaluated 12/4 CT A/P: diffusely dilated SB with decompressed terminal ileum c/w SBO 12/6 Abd XR: dilation of small-bowel loops suggesting ileus or pSBO 12/7 Abd XR: unchanged 12/11 Abx XR: increased in dilated bowel loops 12/12 CT: small volume perihepatic ascites GI Surgeries / Procedures:  12/7 - cholecystectomy with SBR  Central access: IJ placed by IR 05/23/21 TPN start date: 05/23/21  Nutritional Goals: RD Estimated Needs Total Energy Estimated Needs: 1900-2100 Total Protein Estimated Needs: 100-115 gm Total Fluid Estimated Needs: 1.9 L  Current Nutrition:  TPN; NPO  Plan:  Continue concentrated TPN at goal rate  of 65 mL/hr to provide 101g AA, 273g CHO and 58g ILE for a total of 1912 kCal, meeting 100% of needs Electrolytes in TPN: Na 188mEq/L, add K 56mEq/L (= 11 mEq/day), Ca 50mEq/L, reduce Mag slightly to 42mEq/L, add Phos 71mmol/L since 12/6, Cl:Ac 1:1 Add standard MVI and trace elements to TPN; remove chromium in patient on RRT Monitor daily CBG check and adjust as needed (SSI d/c'd 12/10) F/U TPN labs Mon/Thurs, repeat labs on Wed  Patrick [name] D. [name], PharmD, BCPS, [place] 05/29/2021, 8:58 AM

## 2021-05-29 NOTE — H&P (View-Only) (Signed)
Progress Note   Subjective  65 year old male known to our service from prior consultation over a week ago, history of end-stage renal disease, CHF, COPD, pulmonary hypertension, sickle cell anemia, chronic A. fib on Eliquis, history of small bowel obstruction and choledocholithiasis, admitted on 12 2 with recurrent SBO.  On December 7 he had surgery for small bowel resection of SBO in the jejunum as well as cholecystectomy for history of choledocholithiasis.  This is an ex lap with open abdomen, had required some pressors and antibiotics, intubated post procedure.  Extubated on 12/8.  Recall previously that he was admitted at [facility] with SBO and choledocholithiasis at the end of November, he had an ERCP with removal of stones at that time and stent placement.  Yesterday the patient was noted to have some bloody drainage from the NG tube.  He states he felt that he regurgitated some gastric contents into his mouth and spit it out and there was blood and clots in it.  Per review of the note he had several hundred cc of bloody output in his canister yesterday.    He was placed on IV Protonix.  His hemoglobin was 6.6 yesterday with the symptoms, was given 2 units of RBC overnight and his hemoglobin remains at 6.6.  He denies any changes in his abdomen in regards to pain, he is on a PCA postoperatively.  He has not had any blood per rectum that he endorses or nursing has seen.  His heart rate was mild tachycardia into the low 100s overnight.  His blood pressure is 120s over 80s and heart rate ranging from 90s to low 100s this morning.  NG-tube remains in place, he states he is otherwise feeling at baseline.  Scheduled to have another unit of RBC transfusion this morning.  He had a CTA of the abdomen pelvis this morning which was negative for active extravasation.  He does have some dilated mid small bowel loops with distal decompression without focal transition point.  His liver appears okay on  imaging without overt cirrhosis, surgery was notable for questionable changes of cirrhosis.  He has not been on Eliquis recently on what I can review in the chart.   Objective   Vital signs in last 24 hours: Temp:  [97.6 F (36.4 C)-98.7 F (37.1 C)] 97.7 F (36.5 C) (12/12 0728) Pulse Rate:  [81-111] 88 (12/12 0700) Resp:  [8-22] 17 (12/12 0700) BP: (94-130)/(62-91) 126/77 (12/12 0700) SpO2:  [67 %-100 %] 67 % (12/12 0700) FiO2 (%):  [0 %-21 %] 0 % (12/12 0618) Weight:  [69.9 kg] 69.9 kg (12/12 0308) Last BM Date: 05/22/21 General:    AA male in NAD, NG in place Heart:  atrial fibrillation, rate 90s Lungs: Respirations even and unlabored,  Abdomen:  distended, denies pain, post op changes.  Extremities:  Without edema. Neurologic:  Alert and oriented,  grossly normal neurologically. Psych:  Cooperative. Normal mood and affect.  Intake/Output from previous day: 12/11 0701 - 12/12 0700 In: 1942.2 [I.V.:897.2; Blood:805; NG/GT:40; IV Piggyback:200] Out: 9983 [Urine:200; Emesis/NG output:1050] Intake/Output this shift: No intake/output data recorded.  Lab Results: Recent Labs    05/28/21 2000 05/28/21 2031 05/29/21 0340  WBC 13.2* 13.0* 13.4*  HGB 6.6* 6.6* 6.5*  HCT 18.7* 18.7* 19.0*  PLT 92* 93* 88*   BMET Recent Labs    05/28/21 0655 05/28/21 2031 05/29/21 0340  NA 134* 137 137  K 4.8 3.6 3.6  CL 101 104 103  CO2 21* 24 25  GLUCOSE 115* 126* 111*  BUN 48* 60* 65*  CREATININE 4.73* 5.35* 5.72*  CALCIUM 8.4* 8.2* 8.3*   LFT Recent Labs    05/28/21 2031 05/29/21 0340  PROT 4.7* 4.6*  ALBUMIN 1.6* 1.5*  AST 16 14*  ALT 5 6  ALKPHOS 88 79  BILITOT 1.4* 1.4*  BILIDIR 0.5*  --   IBILI 0.9  --    PT/INR Recent Labs    05/29/21 0113  LABPROT 15.9*  INR 1.3*    Studies/Results: DG Abd 1 View  Result Date: 05/28/2021 CLINICAL DATA:  NG placement. EXAM: ABDOMEN - 1 VIEW COMPARISON:  Abdominal radiograph dated 05/24/2021. FINDINGS: Enteric tube  with side port in the proximal stomach tip in the left upper abdomen, likely in the body of the stomach. Dilated and air distended loops of small bowel in the left abdomen measure up to 5.5 cm in diameter. These have increased in caliber compared to prior radiograph. Right upper quadrant cholecystectomy clips and a CBD stent is noted. Mixed density containing air in the left upper abdomen, likely gastric content. No definite free air. Cardiomegaly with vascular congestion. IMPRESSION: 1. Enteric tube with tip in the body of the stomach. 2. Dilated small bowel loops in the left abdomen, increased in caliber compared to prior radiograph. Electronically Signed   By: Anner Crete M.D.   On: 05/28/2021 19:18   DG CHEST PORT 1 VIEW  Result Date: 05/28/2021 CLINICAL DATA:  Short of breath, hemoptysis EXAM: PORTABLE CHEST 1 VIEW COMPARISON:  05/25/2021 FINDINGS: Single frontal view of the chest demonstrates stable right internal jugular catheter tip overlying superior vena cava. Enteric catheter tip projects over gastric fundus. Cardiac silhouette remains enlarged. No airspace disease, effusion, or pneumothorax. Improved central vascular congestion. No acute bony abnormalities. IMPRESSION: 1. Improved central vascular congestion. 2. Support devices as above. Electronically Signed   By: Randa Ngo M.D.   On: 05/28/2021 20:57   CT Angio Abd/Pel w/ and/or w/o  Result Date: 05/29/2021 CLINICAL DATA:  Upper GI bleed, post small bowel resection, rule out retroperitoneal bleed EXAM: CTA ABDOMEN AND PELVIS WITHOUT AND WITH CONTRAST TECHNIQUE: Multidetector CT imaging of the abdomen and pelvis was performed using the standard protocol during bolus administration of intravenous contrast. Multiplanar reconstructed images and MIPs were obtained and reviewed to evaluate the vascular anatomy. CONTRAST:  11mL OMNIPAQUE IOHEXOL 350 MG/ML SOLN COMPARISON:  05/21/2021 FINDINGS: VASCULAR Mild four-chamber cardiac  enlargement. Aorta: Mild scattered calcified atheromatous plaque. No aneurysm, dissection, or stenosis. Celiac: Patent without evidence of aneurysm, dissection, vasculitis or significant stenosis. Left gastric artery arises directly from the aorta, an anatomic variant. SMA: Patent without evidence of aneurysm, dissection, vasculitis or significant stenosis. Mild calcified plaque Renals: Both renal arteries are patent without evidence of aneurysm, dissection, vasculitis, fibromuscular dysplasia or significant stenosis. Mild nonocclusive calcified plaque proximally. IMA: Patent without evidence of aneurysm, dissection, vasculitis or significant stenosis. Inflow: Nonocclusive calcified plaque in bilateral common and internal iliac arteries. No aneurysm, dissection, stenosis, or active extravasation. Proximal Outflow: Mildly atheromatous, patent. Veins: Patent hepatic veins, portal vein, SM V, bilateral renal veins, iliac venous system, and IVC. Review of the MIP images confirms the above findings. NON-VASCULAR Lower chest: Small bilateral pleural effusions. Subsegmental atelectasis/consolidation posteriorly in the lung bases, right greater than left. Hepatobiliary: Post cholecystectomy. No focal liver lesion or biliary ductal dilatation. Pancreas: Unremarkable. No pancreatic ductal dilatation or surrounding inflammatory changes. Spleen: Small with coarse calcifications Adrenals/Urinary Tract: Adrenal glands unremarkable.  Stable small renal cysts. No urolithiasis or hydronephrosis. Urinary bladder physiologically distended. Stomach/Bowel: Nasogastric tube extends into the stomach. Stomach is partially distended by gas and debris. Endoscopic clips in the proximal duodenum. Dilated loops of mid small bowel predominantly in the left abdomen, measure up to 4.3 cm diameter in the left lower quadrant. Distal loops of ileum are decompressed. No discrete transition point is identified. Surgical staple lines in the right lower  quadrant. Chronic appendicoliths in the appendix without acute change. The colon is nondilated, unremarkable. Lymphatic: No abdominal or pelvic adenopathy. Reproductive: Prostate is unremarkable. Other: Small volume perihepatic ascites. Small amount of free intraperitoneal gas in the upper abdomen presumably postoperative. Musculoskeletal: Left mid abdominal surgical drain extends up to the subhepatic space. Hemostasis material in the gallbladder fossa. Coarse sclerotic changes throughout the visualized spine and in the pelvis presumably related to chronic renal disease. No fracture or worrisome bone lesion. IMPRESSION: 1. Negative for retroperitoneal hematoma or   active extravasation. 2. Dilated mid small bowel loops, decompressed distally, without discrete transition point identified. 3. Small volume perihepatic ascites, and small pleural effusions. 4.  Aortic Atherosclerosis (ICD10-170.0). Electronically Signed   By: Lucrezia Europe M.D.   On: 05/29/2021 07:50       Assessment / Plan:    65 year old male with complicated course as outlined above - ERCP a few weeks ago with stent placement and removal of gallstones, small bowel obstruction status post resection of jejunum on 12/7 and cholecystectomy, now presenting with upper tract GI bleeding.  Has had a fair amount of bloody output from the NG and had hematemesis yesterday.  No melena, small bowel is somewhat distended on CTA but no active extravasation.  Despite 2 units of RBC transfusion his hemoglobin has not changed.  He appears hemodynamically stable at this time.  Dr. Rush Landmark discussed his case with the surgical team overnight and felt his anastomosis was distal enough that the patient could tolerate EGD/enteroscopy to evaluate this.  I discussed his case with Dr. Haroldine Laws of cardiology, the patient was given 20-30% mortality risk with his prior surgery and did well with that, he feels patient would be okay to proceed with endoscopy  today.  Discussed the situation with the patient. Discussed ddx for upper tract bleeding with the patient to include common causes as well as post sphincterotomy related bleeding which is also possible given his prior ERCP, discussed options with the patient, he is currently on IV Protonix.  We will plan for blood transfusion this morning and hopefully improve his hemoglobin prior to anesthesia.  Hopefully EGD later this afternoon if patient is stable for it.  If any significant bleeding in the interim or hemodynamic changes please contact me, not much output from the NG tube this morning, nursing staff reports output has lessened.  I discussed the risks and benefits of endoscopy and anesthesia with the patient and he is agreeable to proceed with this. I tried contacting his cousin Caren Griffins who is his point of contact however she was not available, will try later today. Otherwise continue NPO, await blood transfusion, continue PPI, hopefully EGD / enteroscopy later today.  Plan: - NPO - continue IV protonix - RBC transfusion this AM - tentative EGD / enteroscopy later today  Jolly Mango, MD [facility]

## 2021-05-29 NOTE — Transfer of Care (Signed)
Immediate Anesthesia Transfer of Care Note  Patient: Patrick [name]  Procedure(s) Performed: ESOPHAGOGASTRODUODENOSCOPY (EGD) STENT REMOVAL  Patient Location: PACU  Anesthesia Type:General  Level of Consciousness: awake, alert  and oriented  Airway & Oxygen Therapy: Patient Spontanous Breathing  Post-op Assessment: Post -op Vital signs reviewed and stable  Post vital signs: stable  Last Vitals:  Vitals Value Taken Time  BP 132/83 05/29/21 1621  Temp    Pulse 91 05/29/21 1622  Resp 13 05/29/21 1622  SpO2 99 % 05/29/21 1622  Vitals shown include unvalidated device data.  Last Pain:  Vitals:   05/29/21 1459  TempSrc: Oral  PainSc: 0-No pain      Patients Stated Pain Goal: 0 (56/38/75 [id number])  Complications: No notable events documented.

## 2021-05-30 LAB — BASIC METABOLIC PANEL
Anion gap: 12 (ref 5–15)
BUN: 33 mg/dL — ABNORMAL HIGH (ref 8–23)
CO2: 26 mmol/L (ref 22–32)
Calcium: 8.7 mg/dL — ABNORMAL LOW (ref 8.9–10.3)
Chloride: 100 mmol/L (ref 98–111)
Creatinine, Ser: 3.18 mg/dL — ABNORMAL HIGH (ref 0.61–1.24)
GFR, Estimated: 21 mL/min — ABNORMAL LOW (ref >60)
Glucose, Bld: 111 mg/dL — ABNORMAL HIGH (ref 70–99)
Potassium: 3.4 mmol/L — ABNORMAL LOW (ref 3.5–5.1)
Sodium: 138 mmol/L (ref 135–145)

## 2021-05-30 LAB — MAGNESIUM: Magnesium: 1.9 mg/dL (ref 1.7–2.4)

## 2021-05-30 LAB — COMPREHENSIVE METABOLIC PANEL
ALT: 9 U/L (ref 0–44)
AST: 18 U/L (ref 15–41)
Albumin: 1.7 g/dL — ABNORMAL LOW (ref 3.5–5.0)
Alkaline Phosphatase: 99 U/L (ref 38–126)
Anion gap: 8 (ref 5–15)
BUN: 24 mg/dL — ABNORMAL HIGH (ref 8–23)
CO2: 28 mmol/L (ref 22–32)
Calcium: 8.2 mg/dL — ABNORMAL LOW (ref 8.9–10.3)
Chloride: 99 mmol/L (ref 98–111)
Creatinine, Ser: 2.72 mg/dL — ABNORMAL HIGH (ref 0.61–1.24)
GFR, Estimated: 25 mL/min — ABNORMAL LOW (ref >60)
Glucose, Bld: 118 mg/dL — ABNORMAL HIGH (ref 70–99)
Potassium: 2.7 mmol/L — CL (ref 3.5–5.1)
Sodium: 135 mmol/L (ref 135–145)
Total Bilirubin: 1.3 mg/dL — ABNORMAL HIGH (ref 0.3–1.2)
Total Protein: 5.1 g/dL — ABNORMAL LOW (ref 6.5–8.1)

## 2021-05-30 LAB — CBC
HCT: 20.9 % — ABNORMAL LOW (ref 39.0–52.0)
HCT: 19.6 % — ABNORMAL LOW (ref 39.0–52.0)
Hemoglobin: 7 g/dL — ABNORMAL LOW (ref 13.0–17.0)
Hemoglobin: 6.5 g/dL — CL (ref 13.0–17.0)
MCH: 29.3 pg (ref 26.0–34.0)
MCH: 29 pg (ref 26.0–34.0)
MCHC: 33.5 g/dL (ref 30.0–36.0)
MCHC: 33.2 g/dL (ref 30.0–36.0)
MCV: 87.4 fL (ref 80.0–100.0)
MCV: 87.5 fL (ref 80.0–100.0)
Platelets: 122 K/uL — ABNORMAL LOW (ref 150–400)
Platelets: 131 K/uL — ABNORMAL LOW (ref 150–400)
RBC: 2.39 MIL/uL — ABNORMAL LOW (ref 4.22–5.81)
RBC: 2.24 MIL/uL — ABNORMAL LOW (ref 4.22–5.81)
RDW: 20.4 % — ABNORMAL HIGH (ref 11.5–15.5)
RDW: 20.4 % — ABNORMAL HIGH (ref 11.5–15.5)
WBC: 16.7 K/uL — ABNORMAL HIGH (ref 4.0–10.5)
WBC: 15.1 K/uL — ABNORMAL HIGH (ref 4.0–10.5)
nRBC: 0.4 % — ABNORMAL HIGH (ref 0.0–0.2)
nRBC: 0.4 % — ABNORMAL HIGH (ref 0.0–0.2)

## 2021-05-30 LAB — GLUCOSE, CAPILLARY
Glucose-Capillary: 107 mg/dL — ABNORMAL HIGH (ref 70–99)
Glucose-Capillary: 114 mg/dL — ABNORMAL HIGH (ref 70–99)

## 2021-05-30 LAB — PREPARE RBC (CROSSMATCH)

## 2021-05-30 LAB — PHOSPHORUS
Phosphorus: 1.5 mg/dL — ABNORMAL LOW (ref 2.5–4.6)
Phosphorus: 3.2 mg/dL (ref 2.5–4.6)

## 2021-05-30 LAB — SURGICAL PATHOLOGY

## 2021-05-30 MED ORDER — POTASSIUM PHOSPHATES 15 MMOLE/5ML IV SOLN
15.0000 mmol | Freq: Once | INTRAVENOUS | Status: AC
  Administered 2021-05-30: 15 mmol via INTRAVENOUS
  Filled 2021-05-30: qty 5

## 2021-05-30 MED ORDER — DARBEPOETIN ALFA 200 MCG/0.4ML IJ SOSY
200.0000 ug | PREFILLED_SYRINGE | INTRAMUSCULAR | Status: DC
  Administered 2021-05-31: 200 ug via INTRAVENOUS
  Filled 2021-05-30 (×3): qty 0.4

## 2021-05-30 MED ORDER — SODIUM CHLORIDE 0.9% IV SOLUTION: Freq: Once | INTRAVENOUS | Status: AC

## 2021-05-30 MED ORDER — POTASSIUM CHLORIDE 10 MEQ/50ML IV SOLN: 10.0000 meq | INTRAVENOUS | Status: DC

## 2021-05-30 MED ORDER — ACETAMINOPHEN 10 MG/ML IV SOLN
1000.0000 mg | Freq: Four times a day (QID) | INTRAVENOUS | Status: DC
  Administered 2021-05-30 (×2): 1000 mg via INTRAVENOUS
  Filled 2021-05-30 (×5): qty 100

## 2021-05-30 MED ORDER — POTASSIUM CHLORIDE 10 MEQ/100ML IV SOLN
10.0000 meq | Freq: Once | INTRAVENOUS | Status: AC
  Administered 2021-05-30: 10 meq via INTRAVENOUS
  Filled 2021-05-30: qty 100

## 2021-05-30 MED ORDER — POTASSIUM PHOSPHATES 15 MMOLE/5ML IV SOLN
45.0000 mmol | Freq: Once | INTRAVENOUS | Status: DC
  Filled 2021-05-30: qty 15

## 2021-05-30 MED ORDER — SIMETHICONE 40 MG/0.6ML PO SUSP
80.0000 mg | Freq: Four times a day (QID) | ORAL | Status: DC | PRN
  Administered 2021-05-30: 80 mg via ORAL
  Filled 2021-05-30 (×2): qty 1.2

## 2021-05-30 MED ORDER — TRACE MINERALS CU-MN-SE-ZN 300-55-60-3000 MCG/ML IV SOLN
INTRAVENOUS | Status: AC
  Filled 2021-05-30: qty 676

## 2021-05-30 MED ORDER — PANTOPRAZOLE SODIUM 40 MG IV SOLR
40.0000 mg | Freq: Two times a day (BID) | INTRAVENOUS | Status: DC
  Administered 2021-05-30 – 2021-06-09 (×21): 40 mg via INTRAVENOUS
  Filled 2021-05-30 (×21): qty 40

## 2021-05-30 MED ORDER — METHOCARBAMOL 1000 MG/10ML IJ SOLN
1000.0000 mg | Freq: Three times a day (TID) | INTRAVENOUS | Status: DC
  Administered 2021-05-30 – 2021-06-01 (×5): 1000 mg via INTRAVENOUS
  Filled 2021-05-30 (×3): qty 10
  Filled 2021-05-30: qty 1000
  Filled 2021-05-30: qty 10
  Filled 2021-05-30: qty 1000
  Filled 2021-05-30 (×2): qty 10

## 2021-05-30 MED ORDER — MAGNESIUM SULFATE 2 GM/50ML IV SOLN
2.0000 g | Freq: Once | INTRAVENOUS | Status: AC
  Administered 2021-05-30: 2 g via INTRAVENOUS
  Filled 2021-05-30: qty 50

## 2021-05-30 NOTE — Progress Notes (Signed)
Hgb-6.5 and hematocrit 19  Dr  Halford Chessman made aware with order for crossmatch and transfuse 1 unit PRBC. Lab made aware to do crossmatch spoke with JEN. As per pt. blood drawing is not done in the cental line.

## 2021-05-30 NOTE — Progress Notes (Signed)
Progress Note   Subjective  EGD done yesterday, actively bleeding AVM in the stomach. Treated endoscopically with a good result. Post transfusion Hgb relatively stable. Some old bloody output from NG. He denies any pain at this time. No bowel movements.   Objective   Vital signs in last 24 hours: Temp:  [97.6 F (36.4 C)-98.6 F (37 C)] 98.3 F (36.8 C) (12/13 0713) Pulse Rate:  [86-117] 100 (12/13 0800) Resp:  [9-37] 11 (12/13 0800) BP: (103-151)/(64-102) 123/75 (12/13 0800) SpO2:  [95 %-100 %] 96 % (12/13 0800) Weight:  [69.5 kg-71 kg] 69.5 kg (12/13 0315) Last BM Date: 05/22/21 General:    AA male in NAD, NG in place Neurologic:  Alert and oriented,  grossly normal neurologically. Psych:  Cooperative. Normal mood and affect.  Intake/Output from previous day: 12/12 0701 - 12/13 0700 In: 2139.5 [I.V.:1707.5; Blood:312; NG/GT:120] Out: 2960 [Urine:600; Emesis/NG output:850; Drains:10] Intake/Output this shift: Total I/O In: 179.9 [I.V.:129.9; IV Piggyback:50] Out: 210 [Urine:200; Drains:10]  Lab Results: Recent Labs    05/29/21 1741 05/29/21 2220 05/30/21 0427  WBC 18.9* 13.1* 16.7*  HGB 7.3* 7.1* 7.0*  HCT 22.1* 20.4* 20.9*  PLT 122* 110* 122*   BMET Recent Labs    05/28/21 2031 05/29/21 0340 05/30/21 0427  NA 137 137 135  K 3.6 3.6 2.7*  CL 104 103 99  CO2 24 25 28   GLUCOSE 126* 111* 118*  BUN 60* 65* 24*  CREATININE 5.35* 5.72* 2.72*  CALCIUM 8.2* 8.3* 8.2*   LFT Recent Labs    05/28/21 2031 05/29/21 0340 05/30/21 0427  PROT 4.7*   < > 5.1*  ALBUMIN 1.6*   < > 1.7*  AST 16   < > 18  ALT 5   < > 9  ALKPHOS 88   < > 99  BILITOT 1.4*   < > 1.3*  BILIDIR 0.5*  --   --   IBILI 0.9  --   --    < > = values in this interval not displayed.   PT/INR Recent Labs    05/29/21 0113 05/29/21 1327  LABPROT 15.9* 15.9*  INR 1.3* 1.3*    Studies/Results: DG Abd 1 View  Result Date: 05/28/2021 CLINICAL DATA:  NG placement. EXAM: ABDOMEN  - 1 VIEW COMPARISON:  Abdominal radiograph dated 05/24/2021. FINDINGS: Enteric tube with side port in the proximal stomach tip in the left upper abdomen, likely in the body of the stomach. Dilated and air distended loops of small bowel in the left abdomen measure up to 5.5 cm in diameter. These have increased in caliber compared to prior radiograph. Right upper quadrant cholecystectomy clips and a CBD stent is noted. Mixed density containing air in the left upper abdomen, likely gastric content. No definite free air. Cardiomegaly with vascular congestion. IMPRESSION: 1. Enteric tube with tip in the body of the stomach. 2. Dilated small bowel loops in the left abdomen, increased in caliber compared to prior radiograph. Electronically Signed   By: Anner Crete M.D.   On: 05/28/2021 19:18   DG CHEST PORT 1 VIEW  Result Date: 05/28/2021 CLINICAL DATA:  Short of breath, hemoptysis EXAM: PORTABLE CHEST 1 VIEW COMPARISON:  05/25/2021 FINDINGS: Single frontal view of the chest demonstrates stable right internal jugular catheter tip overlying superior vena cava. Enteric catheter tip projects over gastric fundus. Cardiac silhouette remains enlarged. No airspace disease, effusion, or pneumothorax. Improved central vascular congestion. No acute bony abnormalities. IMPRESSION: 1. Improved central vascular  congestion. 2. Support devices as above. Electronically Signed   By: Randa Ngo M.D.   On: 05/28/2021 20:57   CT Angio Abd/Pel w/ and/or w/o  Result Date: 05/29/2021 CLINICAL DATA:  Upper GI bleed, post small bowel resection, rule out retroperitoneal bleed EXAM: CTA ABDOMEN AND PELVIS WITHOUT AND WITH CONTRAST TECHNIQUE: Multidetector CT imaging of the abdomen and pelvis was performed using the standard protocol during bolus administration of intravenous contrast. Multiplanar reconstructed images and MIPs were obtained and reviewed to evaluate the vascular anatomy. CONTRAST:  167mL OMNIPAQUE IOHEXOL 350 MG/ML  SOLN COMPARISON:  05/21/2021 FINDINGS: VASCULAR Mild four-chamber cardiac enlargement. Aorta: Mild scattered calcified atheromatous plaque. No aneurysm, dissection, or stenosis. Celiac: Patent without evidence of aneurysm, dissection, vasculitis or significant stenosis. Left gastric artery arises directly from the aorta, an anatomic variant. SMA: Patent without evidence of aneurysm, dissection, vasculitis or significant stenosis. Mild calcified plaque Renals: Both renal arteries are patent without evidence of aneurysm, dissection, vasculitis, fibromuscular dysplasia or significant stenosis. Mild nonocclusive calcified plaque proximally. IMA: Patent without evidence of aneurysm, dissection, vasculitis or significant stenosis. Inflow: Nonocclusive calcified plaque in bilateral common and internal iliac arteries. No aneurysm, dissection, stenosis, or active extravasation. Proximal Outflow: Mildly atheromatous, patent. Veins: Patent hepatic veins, portal vein, SM V, bilateral renal veins, iliac venous system, and IVC. Review of the MIP images confirms the above findings. NON-VASCULAR Lower chest: Small bilateral pleural effusions. Subsegmental atelectasis/consolidation posteriorly in the lung bases, right greater than left. Hepatobiliary: Post cholecystectomy. No focal liver lesion or biliary ductal dilatation. Pancreas: Unremarkable. No pancreatic ductal dilatation or surrounding inflammatory changes. Spleen: Small with coarse calcifications Adrenals/Urinary Tract: Adrenal glands unremarkable. Stable small renal cysts. No urolithiasis or hydronephrosis. Urinary bladder physiologically distended. Stomach/Bowel: Nasogastric tube extends into the stomach. Stomach is partially distended by gas and debris. Endoscopic clips in the proximal duodenum. Dilated loops of mid small bowel predominantly in the left abdomen, measure up to 4.3 cm diameter in the left lower quadrant. Distal loops of ileum are decompressed. No discrete  transition point is identified. Surgical staple lines in the right lower quadrant. Chronic appendicoliths in the appendix without acute change. The colon is nondilated, unremarkable. Lymphatic: No abdominal or pelvic adenopathy. Reproductive: Prostate is unremarkable. Other: Small volume perihepatic ascites. Small amount of free intraperitoneal gas in the upper abdomen presumably postoperative. Musculoskeletal: Left mid abdominal surgical drain extends up to the subhepatic space. Hemostasis material in the gallbladder fossa. Coarse sclerotic changes throughout the visualized spine and in the pelvis presumably related to chronic renal disease. No fracture or worrisome bone lesion. IMPRESSION: 1. Negative for retroperitoneal hematoma or   active extravasation. 2. Dilated mid small bowel loops, decompressed distally, without discrete transition point identified. 3. Small volume perihepatic ascites, and small pleural effusions. 4.  Aortic Atherosclerosis (ICD10-170.0). Electronically Signed   By: Lucrezia Europe M.D.   On: 05/29/2021 07:50       Assessment / Plan:    65 y/o male with history of ERCP for choledocholithiasis, bowel obstruction s/p resection of jejunum, cholecystectomy, course complicated by upper GI bleeding in recent days. EGD per Dr. Ardis Hughs yesterday showed a significant amount of blood in the stomach, lavaged and AVM bleeding in the stomach. Unclear if ?trauma from NG causing this? Treated endoscopically. His Hgb has been stable post-transfusion. It looks like old blood suctioned via NG, he has not passed this per rectum given his ileus which persists. Stable at this time.  Plan: - okay to transition his protonix to  40mg  IV every 12 hours, stop drip - await course following NG being clamped today - continue to hold Eliquis for now given recent endoscopic intervention  Call with questions or symptoms concerning for rebleeding.  Jolly Mango, MD [facility]

## 2021-05-30 NOTE — Progress Notes (Signed)
Transferred in from [place] by bed awake and alert. NGT connected to low suction.

## 2021-05-30 NOTE — Progress Notes (Addendum)
PHARMACY - TOTAL PARENTERAL NUTRITION CONSULT NOTE  Indication: Small bowel obstruction  Patient Measurements: Height: 5\' 11"  (180.3 cm) Weight: 69.5 kg (153 lb 3.5 oz) IBW/kg (Calculated) : 75.3 TPN AdjBW (KG): 69.9 Body mass index is 21.37 kg/m. Usual Weight: 154 lb 02/28/21  Assessment:  89 yom with ESRD admitted with worsening abdominal pain, found to have high-grade SBO. He had recent admission at [facility] for SBO resolved with conservative management; also had choledocholithiasis s/p ERCP with stone extraction, stent to CBD and PD. Lap chole recommended but declined. Surgery reports no flatus or BM except with enema on 12/5 and unlikely to resolve without surgical intervention. Given prolonged NPO status, pharmacy consulted for TPN for nutritional support. At risk for refeeding syndrome.  Glucose / Insulin: no hx DM, CBGs controlled. SSI d/c'd 12/10 Electrolytes: labs post HD (2K bath on 12/12) - K down to 2.7 (goal >/= 4 for Afib/ileus), Phos down to 1.5, Mag 1.9 (goal >/= 2), others stable WNL Renal: ESRD on HD TTS - off schedule Hepatic: LFTs / TG WNL, tbili down to 1.3 (no evidence of jaundice per RN 12/7), albumin 1.5 Intake / Output; MIVF: UOP 678mL, NG 877mL, drain 77mL GI Imaging:  12/3 Abd XR: Probable persistent SBO not well evaluated 12/4 CT A/P: diffusely dilated SB with decompressed terminal ileum c/w SBO 12/6 Abd XR: dilation of small-bowel loops suggesting ileus or pSBO 12/7 Abd XR: unchanged 12/11 Abx XR: increased in dilated bowel loops 12/12 CT: small volume perihepatic ascites GI Surgeries / Procedures:  12/7 cholecystectomy with SBR 12/12 EGD: ulcerative esophagitis, bleeding AVM post clip/epi/APC  Central access: IJ placed by IR 05/23/21 TPN start date: 05/23/21  Nutritional Goals: RD Estimated Needs Total Energy Estimated Needs: 1900-2100 Total Protein Estimated Needs: 100-115 gm Total Fluid Estimated Needs: 1.9 L  Current Nutrition:   TPN  Plan:  Continue concentrated TPN at goal rate of 65 mL/hr to provide 101g AA, 273g CHO and 58g ILE for a total of 1912 kCal, meeting 100% of needs Electrolytes in TPN: increase Na 142mEq/L, K 65mEq/L added 12/12 (= 11 mEq/day), Ca 83mEq/L, Mag reduced slightly to 71mEq/L on 12/12, increase Phos 54mmol/L, change Cl:Ac 2:1 Add standard MVI and trace elements to TPN; remove chromium in patient on RRT KPhos 52mmol and Mag sulfate 2gm IV per MD F/U TPN labs Mon/Thurs, AM labs ordered  Clamping NG tube  Alyze Lauf D. [name], PharmD, BCPS, [place] 05/30/2021, 10:17 AM

## 2021-05-30 NOTE — Progress Notes (Signed)
Progress Note  1 Day Post-Op  Subjective: No complaints today.  Feels ok.  No flatus yet.  NGT with 500cc overnight of old dark bloody output.  Nothing that appears fresh.  Vitals stable  Objective: Vital signs in last 24 hours: Temp:  [97.5 F (36.4 C)-98.6 F (37 C)] 98.3 F (36.8 C) (12/13 0713) Pulse Rate:  [86-117] 94 (12/13 0600) Resp:  [9-37] 11 (12/13 0800) BP: (103-151)/(65-102) 130/70 (12/13 0600) SpO2:  [93 %-100 %] 96 % (12/13 0800) Weight:  [69.5 kg-71 kg] 69.5 kg (12/13 0315) Last BM Date: 05/22/21  Intake/Output from previous day: 12/12 0701 - 12/13 0700 In: 2139.5 [I.V.:1707.5; Blood:312; NG/GT:120] Out: 2960 [Urine:600; Emesis/NG output:850; Drains:10] Intake/Output this shift: No intake/output data recorded.  PE: General: NAD Abd: soft, appropriately tender, NGT in place with some old bloody looking output. midline incision is clean and packed.  JP drain with serous drainage   Lab Results:  Recent Labs    05/29/21 2220 05/30/21 0427  WBC 13.1* 16.7*  HGB 7.1* 7.0*  HCT 20.4* 20.9*  PLT 110* 122*   BMET Recent Labs    05/29/21 0340 05/30/21 0427  NA 137 135  K 3.6 2.7*  CL 103 99  CO2 25 28  GLUCOSE 111* 118*  BUN 65* 24*  CREATININE 5.72* 2.72*  CALCIUM 8.3* 8.2*   PT/INR Recent Labs    05/29/21 0113 05/29/21 1327  LABPROT 15.9* 15.9*  INR 1.3* 1.3*   CMP     Component Value Date/Time   NA 135 05/30/2021 0427   NA 135 05/24/2020 1119   K 2.7 (LL) 05/30/2021 0427   CL 99 05/30/2021 0427   CO2 28 05/30/2021 0427   GLUCOSE 118 (H) 05/30/2021 0427   BUN 24 (H) 05/30/2021 0427   BUN 25 05/24/2020 1119   CREATININE 2.72 (H) 05/30/2021 0427   CREATININE 2.90 (H) 08/11/2015 1359   CALCIUM 8.2 (L) 05/30/2021 0427   PROT 5.1 (L) 05/30/2021 0427   PROT 7.5 05/24/2020 1119   ALBUMIN 1.7 (L) 05/30/2021 0427   ALBUMIN 4.1 05/24/2020 1119   AST 18 05/30/2021 0427   ALT 9 05/30/2021 0427   ALKPHOS 99 05/30/2021 0427   BILITOT  1.3 (H) 05/30/2021 0427   BILITOT 2.4 (H) 05/24/2020 1119   GFRNONAA 25 (L) 05/30/2021 0427   GFRNONAA 23 (L) 08/11/2015 1359   GFRAA 24 (L) 05/24/2020 1119   GFRAA 27 (L) 08/11/2015 1359   Lipase     Component Value Date/Time   LIPASE 51 05/19/2021 1445       Studies/Results: DG Abd 1 View  Result Date: 05/28/2021 CLINICAL DATA:  NG placement. EXAM: ABDOMEN - 1 VIEW COMPARISON:  Abdominal radiograph dated 05/24/2021. FINDINGS: Enteric tube with side port in the proximal stomach tip in the left upper abdomen, likely in the body of the stomach. Dilated and air distended loops of small bowel in the left abdomen measure up to 5.5 cm in diameter. These have increased in caliber compared to prior radiograph. Right upper quadrant cholecystectomy clips and a CBD stent is noted. Mixed density containing air in the left upper abdomen, likely gastric content. No definite free air. Cardiomegaly with vascular congestion. IMPRESSION: 1. Enteric tube with tip in the body of the stomach. 2. Dilated small bowel loops in the left abdomen, increased in caliber compared to prior radiograph. Electronically Signed   By: Anner Crete M.D.   On: 05/28/2021 19:18   DG CHEST PORT 1 VIEW  Result  Date: 05/28/2021 CLINICAL DATA:  Short of breath, hemoptysis EXAM: PORTABLE CHEST 1 VIEW COMPARISON:  05/25/2021 FINDINGS: Single frontal view of the chest demonstrates stable right internal jugular catheter tip overlying superior vena cava. Enteric catheter tip projects over gastric fundus. Cardiac silhouette remains enlarged. No airspace disease, effusion, or pneumothorax. Improved central vascular congestion. No acute bony abnormalities. IMPRESSION: 1. Improved central vascular congestion. 2. Support devices as above. Electronically Signed   By: Randa Ngo M.D.   On: 05/28/2021 20:57   CT Angio Abd/Pel w/ and/or w/o  Result Date: 05/29/2021 CLINICAL DATA:  Upper GI bleed, post small bowel resection, rule out  retroperitoneal bleed EXAM: CTA ABDOMEN AND PELVIS WITHOUT AND WITH CONTRAST TECHNIQUE: Multidetector CT imaging of the abdomen and pelvis was performed using the standard protocol during bolus administration of intravenous contrast. Multiplanar reconstructed images and MIPs were obtained and reviewed to evaluate the vascular anatomy. CONTRAST:  152mL OMNIPAQUE IOHEXOL 350 MG/ML SOLN COMPARISON:  05/21/2021 FINDINGS: VASCULAR Mild four-chamber cardiac enlargement. Aorta: Mild scattered calcified atheromatous plaque. No aneurysm, dissection, or stenosis. Celiac: Patent without evidence of aneurysm, dissection, vasculitis or significant stenosis. Left gastric artery arises directly from the aorta, an anatomic variant. SMA: Patent without evidence of aneurysm, dissection, vasculitis or significant stenosis. Mild calcified plaque Renals: Both renal arteries are patent without evidence of aneurysm, dissection, vasculitis, fibromuscular dysplasia or significant stenosis. Mild nonocclusive calcified plaque proximally. IMA: Patent without evidence of aneurysm, dissection, vasculitis or significant stenosis. Inflow: Nonocclusive calcified plaque in bilateral common and internal iliac arteries. No aneurysm, dissection, stenosis, or active extravasation. Proximal Outflow: Mildly atheromatous, patent. Veins: Patent hepatic veins, portal vein, SM V, bilateral renal veins, iliac venous system, and IVC. Review of the MIP images confirms the above findings. NON-VASCULAR Lower chest: Small bilateral pleural effusions. Subsegmental atelectasis/consolidation posteriorly in the lung bases, right greater than left. Hepatobiliary: Post cholecystectomy. No focal liver lesion or biliary ductal dilatation. Pancreas: Unremarkable. No pancreatic ductal dilatation or surrounding inflammatory changes. Spleen: Small with coarse calcifications Adrenals/Urinary Tract: Adrenal glands unremarkable. Stable small renal cysts. No urolithiasis or  hydronephrosis. Urinary bladder physiologically distended. Stomach/Bowel: Nasogastric tube extends into the stomach. Stomach is partially distended by gas and debris. Endoscopic clips in the proximal duodenum. Dilated loops of mid small bowel predominantly in the left abdomen, measure up to 4.3 cm diameter in the left lower quadrant. Distal loops of ileum are decompressed. No discrete transition point is identified. Surgical staple lines in the right lower quadrant. Chronic appendicoliths in the appendix without acute change. The colon is nondilated, unremarkable. Lymphatic: No abdominal or pelvic adenopathy. Reproductive: Prostate is unremarkable. Other: Small volume perihepatic ascites. Small amount of free intraperitoneal gas in the upper abdomen presumably postoperative. Musculoskeletal: Left mid abdominal surgical drain extends up to the subhepatic space. Hemostasis material in the gallbladder fossa. Coarse sclerotic changes throughout the visualized spine and in the pelvis presumably related to chronic renal disease. No fracture or worrisome bone lesion. IMPRESSION: 1. Negative for retroperitoneal hematoma or   active extravasation. 2. Dilated mid small bowel loops, decompressed distally, without discrete transition point identified. 3. Small volume perihepatic ascites, and small pleural effusions. 4.  Aortic Atherosclerosis (ICD10-170.0). Electronically Signed   By: Lucrezia Europe M.D.   On: 05/29/2021 07:50    Anti-infectives: Anti-infectives (From admission, onward)    Start     Dose/Rate Route Frequency Ordered Stop   05/24/21 1730  piperacillin-tazobactam (ZOSYN) IVPB 2.25 g  Status:  Discontinued  2.25 g 100 mL/hr over 30 Minutes Intravenous Every 8 hours 05/24/21 1654 05/29/21 1303   05/24/21 1615  metroNIDAZOLE (FLAGYL) IVPB 500 mg       Note to Pharmacy: Please tube to #[phone]    500 mg 100 mL/hr over 60 Minutes Intravenous  Once 05/24/21 1603 05/24/21 1630        Assessment/Plan POD  6, s/p dx lap converted to ex lap for SBR and cholecystectomy for gangrenous cholecystitis and chronic ischemic segment of SB causing an obstruction, Dr. Donne Hazel 05/24/21 -[facility] -BID dressing changes -cont NGT for post op ileus, but will try clamping today and see how he does.  Would also like to avoid suction near this area of bleeding to not exacerbate this if able. -cont JP drain due to intra-op findings, but can DC this week -mobilize as able, therapies already written for. -TNA for nutritional support while has ileus -zosyn stopped 12/12 after 5 days post op completed   FEN: NPO, NGT clamped today to see how he tolerates, TPN VTE: SCDs, DVT prophylaxis on hold til hgb stabilizes ID: [email or web address], completed 12/12  UGI bleed - secondary to AVM in the gastric body, s/p tx by GI yesterday.  Hgb stable around 7.  Vitals stable.  Doubt any further active bleeding as of now. ESRD - TTS, per renal CHF - per cards A. Fib on eliquis, on hold Anemia - hgb 7 this am Acute hypoxic respiratory failure - extubated and doing well   LOS: 11 days    Henreitta Cea, [facility] 05/30/2021, 8:29 AM Please see Amion for pager number during day hours 7:00am-4:30pm

## 2021-05-30 NOTE — Progress Notes (Signed)
PT Cancellation Note  Patient Details Name: Patrick [name] MRN: [id number] DOB: [birth year]   Cancelled Treatment:    Reason Eval/Treat Not Completed: Patient declined, no reason specified (pt back to bed and reports fatigue and declined therapy at this time)   [place] 05/30/2021, 12:56 PM Bayard Males, PT Acute Rehabilitation Services Pager: [phone] Office: [phone]

## 2021-05-30 NOTE — Progress Notes (Signed)
NAME:  TION TSE, MRN:  [id number], DOB:  12-09-1955, LOS: 98 ADMISSION DATE:  05/19/2021, CONSULTATION DATE:  12/7 REFERRING MD:  Hal Hope,  Reason for Consult: Blood from OGT  History of Present Illness:  Patient is a 65 year old male with pertinent PMH ESRD (dialysis Tuesday, Thursday, Saturday), chronic systolic CHF, COPD, pulm hypertension, sickle cell anemia, chronic A. fib on Eliquis, SBO, choledocholithiasis presents to [facility] on 12/2 with recurrent SBO.  On 05/09/2021, patient was hospitalized at [facility] with SBO and choledocholithiasis.  SBO resolved with conservative measures and choledocholithiasis improved with ERCP and stent placement.  Patient's abdominal pain continue to worsen and was admitted to [facility] on 12/2 which showed recurrent SBO and choledocholithiasis.  Patient hemoglobin low and stopped home Eliquis.  NG tube placed and made NPO. On 12/7 patient received surgery for SBO and choledocholithiasis.  Patient received ex lap, surgical resection of SBO and cholecystectomy.  Patient remained on vaso and remained intubated postprocedure. He was extubated on 12/8 and transferred to [facility] on 12/9.  On 12/11 the patient was noted to have persistent bloody drainge from the NGT, with the patient reportedly coughing up some blood. Dr. Hal Hope spoke with Dr. Lanny Hurst (GS) who ordered DDAVP and Dr. Rush Landmark, the on-call gastroenterologist who recommended starting the patient on a protonix infusion with plans to see for EGD. Infusion was started. Hemoglobin resulted at 6.6. 1 U PRBC was ordered.   Pertinent  Medical History  Sickle cell anemia, afib on eliquis, combined CHF, ESRF on HD TTS, recently hospitalized in [place] for Choledocholithiasis w recent ERCP/Sphinct/CBD and PD stenting/stone extraction (declied cholecystectomy)  Significant Hospital Events: Including procedures, antibiotic start and stop dates in addition to other pertinent events   12/2: admitted  to [facility] for bowel obstruction 12/06:  ECHO EF 40-45%, RV mildly reduced, RVSP 55 mmHg, severe MR, severe TR 12/7: surgery bowel resection and drain placement; arrived to   ICU intubated post procedure 12/11 PCCM consult. 1 U pRBC 12/12 EGD  Interim History / Subjective:  Events as above. Patient remains in ICU>> scope done 12/12>> showed a significant amount of blood in the stomach, lavaged and AVM bleeding in the stomach. Unclear if ?trauma from NG causing this? Treated endoscopically. HGB stable post transfusion , will transition to protonix Q 12 IV, NG clamped per surgery, Eliquis remains on hold HGB 7.0/ WBC 16.7/ Afebrile/ Platelets 122K/ K 2.7/ BUN 24/ Creatinine 2.72 Net + 4187 cc's  600 cc total UO last 24 hours JP was dislodged this am while moving the patient, drained 180 cc's , surgery is aware, and told patient RN to monitor. Feel this is a fluid shift Pt. States he is feeling better  Objective   Blood pressure 130/72, pulse 96, temperature 98.3 F (36.8 C), temperature source Oral, resp. rate 16, height 5\' 11"  (1.803 m), weight 69.5 kg, SpO2 98 %.        Intake/Output Summary (Last 24 hours) at 05/30/2021 1012 Last data filed at 05/30/2021 0900 Gross per 24 hour  Intake 2210.26 ml  Output 3170 ml  Net -959.74 ml   Filed Weights   05/29/21 1459 05/29/21 2217 05/30/21 0315  Weight: 69.9 kg 71 kg 69.5 kg    Examination: General: adult male, OOB in chair, in no distress HEENT: NG tube clamped Neuro: alert, oriented, follows commands  CV: Irregular, no mRG, HR 93  PULM:  Bilateral chest excursion, clear throughout, no use of accessory muscles , sats 94% GI: distended, bsx4 hypoactive,  NGT in place with bloody output and some dark clots. JP drain with scant blood, but large amount of drainage around site.  Extremities: -edema, dry BLE Skin: Midline dressing CDI,  no rashes or lesions noted  Radiology  CXR 12/11 improved compared to prior film. No pneumo or  effusion KUB 12/11 Dialated small bowel loops, no definite free air.  Resolved Hospital Problem list   Acute respiratory failure Hypotension  Assessment & Plan:    SBO: s/p ex lap and surgical resection 12/7. POD 5 Gangrenous cholecystitis: 12/7 cholecystectomy; recent Choledocholithiasis s/p recent ERCP stent placement at [facility] Chronic small bowel ischemia Post op-ileus Bleeding from NGT Tmax 98.6, WBC 16.7,  suspect secondary to surgery, normotensive. AVM bleed per GI ( EGD)  Does not appear symptomatic at this time. No lactic elevation. No hypotension. -s/p DDAVP 12/11  Plan -NPO. - Clamp NG today to see if patient can tolerate -Surgery following - GI following  -Ok to transition to protonix 40 mg IV Q 12 -Continue Zosyn.  -Continue TPN  ESRD: on HD tues, thurs, sat Off schedule per neph. Next treatment 12/12 Plan -Nephrology following -Ensure renal perfusion. Goal MAP 65 or greater. -Avoid neprotoxic drugs as possible. -Strict I&O's -Trend BMP -Replace electrolytes as indicated   Acute Blood Loss Anemia  >> AVM GI Bleed per EGD Unsure cause - Prior EGD in 2019 showed nonbleeding gastritis, Colonoscopy showed small internal hemorrhoids, otherwise normal Chronic Anemia: sickle cell and CKD Thrombocytopenia HBG 7.0. PLT 122, secondary to bleeding s/p 1 units RBC  Repeat 6.5, given 1 additional RBC  Plan -Trend CBC, Obtain Coags -Transfuse PRBC if HBG less than 7 -Monitor for signs of bleeding. Monitor NGT output - Continue to hold Eliquis due to recent endoscopic intervention   HX COPD: takes incruse and ellipta at home Plan: -Continue brovana, yupelri, and pulmicort -Pulm toilet as able -Goal O2 sat 88-96. (Currently on room air)   Chronic systolic CHF: echo 0865 EF of 40 to 45% with mild right ventricular dysfunction and severe biatrial enlargement and severe mitral regurg and tricuspid regurg Afib RVR: on eliquis at home- rate controlled Pulm  HTN Plan: -Continue holding eliquis due to recent surgery and suspected active bleeding -Heart Failure following -Holding home Hydralazine, Imdur, Torsemide given borderline hypotension   Sickle cell disease Plan: -Continue PCA  Protein calorie malnutrition Plan: -Continue TPN  Pt. Will transition of  Protonix gtt. He is hemodynamically stable. Awake and alert, sitting up in chair today. HGB is 7. Will recheck CBC in am. He is getting mag repletion 12/13. Will transfer to Progressive bed 12/13 , triad to pick up am 12/14.  Best Practice (right click and "Reselect all SmartList Selections" daily)   Diet/type: NPO DVT prophylaxis: SCD GI prophylaxis: PPI Lines: Right Subclavian tunneled cath Foley:  N/A and Yes, and it is still needed Code Status:  full code Last date of multidisciplinary goals of care discussion [pending]  Labs   CBC: Recent Labs  Lab 05/24/21 0400 05/24/21 1541 05/28/21 2031 05/29/21 0340 05/29/21 1327 05/29/21 1741 05/29/21 2220 05/30/21 0427  WBC 8.8   < > 13.0* 13.4* 14.9* 18.9* 13.1* 16.7*  NEUTROABS 6.8  --  10.3*  --   --   --   --   --   HGB 7.5*   < > 6.6* 6.5* 7.3* 7.3* 7.1* 7.0*  HCT 21.9*   < > 18.7* 19.0* 22.2* 22.1* 20.4* 20.9*  MCV 100.0   < > 90.8 88.4  87.1 87.7 86.1 87.4  PLT 174   < > 93* 88* 114*   98* 122* 110* 122*   < > = values in this interval not displayed.    Basic Metabolic Panel: Recent Labs  Lab 05/24/21 0400 05/24/21 1541 05/25/21 0609 05/26/21 0508 05/27/21 0751 05/28/21 0655 05/28/21 2031 05/29/21 0340 05/30/21 0427  NA 138   < > 134* 134* 134* 134* 137 137 135  K 3.9   < > 3.6 3.3* 3.6 4.8 3.6 3.6 2.7*  CL 95*   < > 98 96* 100 101 104 103 99  CO2 29   < > 25 24 27  21* 24 25 28   GLUCOSE 108*   < > 210* 102* 116* 115* 126* 111* 118*  BUN 81*   < > 30* 47* 29* 48* 60* 65* 24*  CREATININE 7.06*   < > 3.69* 4.71* 3.44* 4.73* 5.35* 5.72* 2.72*  CALCIUM 8.6*   < > 8.0* 8.3* 8.6* 8.4* 8.2* 8.3* 8.2*  MG 2.6*   --  1.9 2.1  --   --  1.9 2.2 1.9  PHOS 6.2*  --  4.1 4.1  --   --   --  3.1 1.5*   < > = values in this interval not displayed.   GFR: Estimated Creatinine Clearance: 27.3 mL/min (A) (by C-G formula based on SCr of 2.72 mg/dL (H)). Recent Labs  Lab 05/28/21 2031 05/28/21 2331 05/29/21 0340 05/29/21 1327 05/29/21 1741 05/29/21 2220 05/30/21 0427  WBC 13.0*  --    < > 14.9* 18.9* 13.1* 16.7*  LATICACIDVEN 0.7 0.8  --   --   --   --   --    < > = values in this interval not displayed.    Liver Function Tests: Recent Labs  Lab 05/24/21 0400 05/25/21 0609 05/28/21 2031 05/29/21 0340 05/30/21 0427  AST 35 32 16 14* 18  ALT 18 16 5 6 9   ALKPHOS 163* 110 88 79 99  BILITOT 2.3* 1.5* 1.4* 1.4* 1.3*  PROT 6.0* 4.8* 4.7* 4.6* 5.1*  ALBUMIN 2.4* 1.9* 1.6* 1.5* 1.7*   No results for input(s): LIPASE, AMYLASE in the last 168 hours.  No results for input(s): AMMONIA in the last 168 hours.  ABG    Component Value Date/Time   PHART 7.523 (H) 05/24/2021 1756   PCO2ART 40.1 05/24/2021 1756   PO2ART 571 (H) 05/24/2021 1756   HCO3 33.0 (H) 05/24/2021 1756   TCO2 34 (H) 05/24/2021 1756   ACIDBASEDEF 9.0 (H) 06/11/2011 0958   O2SAT 100.0 05/24/2021 1756     Coagulation Profile: Recent Labs  Lab 05/24/21 1855 05/29/21 0113 05/29/21 1327  INR 1.2 1.3* 1.3*    Cardiac Enzymes: No results for input(s): CKTOTAL, CKMB, CKMBINDEX, TROPONINI in the last 168 hours.  HbA1C: Hgb A1c MFr Bld  Date/Time Value Ref Range Status  05/25/2021 01:35 PM 4.9 4.8 - 5.6 % Final    Comment:    (NOTE) **Verified by repeat analysis**         Prediabetes: 5.7 - 6.4         Diabetes: >6.4         Glycemic control for adults with diabetes: <7.0   06/07/2011 04:03 PM <4.0 <5.7 % Final    Comment:    (NOTE)  According to the ADA Clinical Practice Recommendations for 2011, when HbA1c is used as a screening test:  >=6.5%    Diagnostic of Diabetes Mellitus           (if abnormal result is confirmed) 5.7-6.4%   Increased risk of developing Diabetes Mellitus References:Diagnosis and Classification of Diabetes Mellitus,Diabetes GGYI,9485,46(EVOJJ 1):S62-S69 and Standards of Medical Care in         Diabetes - 2011,Diabetes KKXF,8182,99 (Suppl 1):S11-S61. Result repeated and verified. Suggest recollect if clinically inducated.    CBG: Recent Labs  Lab 05/28/21 0817 05/28/21 1306 05/28/21 1657 05/28/21 2031 05/28/21 2345  GLUCAP 111* 120* 120* 133* 134*     Critical care time: 35 minutes    Magdalen Spatz, MSN, AGACNP-BC [place] for personal pager PCCM on call pager [phone]  05/30/2021 10:43 AM

## 2021-05-30 NOTE — Progress Notes (Addendum)
Subjective: I/O -1.4 L yesterday w/ NG and drains output + UF on HD. Hb up 7.9 today. Other labs ok. Bp's stable, afib per tele. On Room air.   Objective Vital signs in last 24 hours: Vitals:   05/30/21 0900 05/30/21 1000 05/30/21 1100 05/30/21 1123  BP: 130/72 133/78 125/76   Pulse: 96 91 95   Resp: 16 11 12    Temp:    98.4 F (36.9 C)  TempSrc:    Oral  SpO2: 98% 97% 94%   Weight:      Height:       Weight change: 0 kg  Physical Exam: General: thin chronically ill male, on room air, NG in place Heart: RRR no MRG noted Lungs: coarse BS b/l Abdomen: incisions bandaged Extremities: No pedal edema  dialysis Access: RUA aVF+ bruit  OP HD: TTS AF   4h  400/500   65kg  2/2 bath  P2  AVF  Hep none - Sensipar 30mg  PO q HD - Venofer 50mg  IV weekly - Mircera 227mcg IV q 2 weeks (last 11/8)   Assessment/ Plan SBO/ recent gallstone with CBD &pancreatic duct stenting: s/p ex lap here 12/07 w/ cholecystectomy and SB resection (gangrenous GB and chronic SB ischemia). Remains on TPN, per gen surgery.  UGIB - d/t AVM in stomach, rx'd by GI w/ APC/ endoclips/ epi sq on 12/12  ESRD: usual HD TTS. HD today off schedule.  HTN/volume: BP stable, +4kg, UF goal 3 L goal today.  Anemia CKD/ sickle cell/ abl- Aranesp with HD today.  Check Fe values. Hb 7.9 today, sp 9u prbc's total here.  Hypokalemia - getting supplemented, give another 3 IV runs today  Metabolic bone disease: Ca/Phos ok - resume binders when eating  Nutrition: on TPN,+ NGT, will order supplements when cleared to eat.  A-fib: per cardiology   Kelly Splinter, MD 05/31/2021, 12:16 PM      Labs: Basic Metabolic Panel: Recent Labs  Lab 05/26/21 0508 05/27/21 0751 05/28/21 2031 05/29/21 0340 05/30/21 0427  NA 134*   < > 137 137 135  K 3.3*   < > 3.6 3.6 2.7*  CL 96*   < > 104 103 99  CO2 24   < > 24 25 28   GLUCOSE 102*   < > 126* 111* 118*  BUN 47*   < > 60* 65* 24*  CREATININE 4.71*   < > 5.35* 5.72* 2.72*  CALCIUM  8.3*   < > 8.2* 8.3* 8.2*  PHOS 4.1  --   --  3.1 1.5*   < > = values in this interval not displayed.    Liver Function Tests: Recent Labs  Lab 05/28/21 2031 05/29/21 0340 05/30/21 0427  AST 16 14* 18  ALT 5 6 9   ALKPHOS 88 79 99  BILITOT 1.4* 1.4* 1.3*  PROT 4.7* 4.6* 5.1*  ALBUMIN 1.6* 1.5* 1.7*    No results for input(s): LIPASE, AMYLASE in the last 168 hours.  No results for input(s): AMMONIA in the last 168 hours. CBC: Recent Labs  Lab 05/24/21 0400 05/24/21 1541 05/28/21 2031 05/29/21 0340 05/29/21 1327 05/29/21 1741 05/29/21 2220 05/30/21 0427  WBC 8.8   < > 13.0* 13.4* 14.9* 18.9* 13.1* 16.7*  NEUTROABS 6.8  --  10.3*  --   --   --   --   --   HGB 7.5*   < > 6.6* 6.5* 7.3* 7.3* 7.1* 7.0*  HCT 21.9*   < > 18.7* 19.0* 22.2*  22.1* 20.4* 20.9*  MCV 100.0   < > 90.8 88.4 87.1 87.7 86.1 87.4  PLT 174   < > 93* 88* 114*   98* 122* 110* 122*   < > = values in this interval not displayed.    Cardiac Enzymes: No results for input(s): CKTOTAL, CKMB, CKMBINDEX, TROPONINI in the last 168 hours. CBG: Recent Labs  Lab 05/28/21 0817 05/28/21 1306 05/28/21 1657 05/28/21 2031 05/28/21 2345  GLUCAP 111* 120* 120* 133* 134*      Medications:  methocarbamol (ROBAXIN) IV     pantoprazole 8 mg/hr (05/29/21 2300)   potassium PHOSPHATE IVPB (in mmol) 43 mL/hr at 05/30/21 1100   TPN ADULT (ION) 65 mL/hr at 05/30/21 1100   TPN ADULT (ION)      sodium chloride   Intravenous Once   arformoterol  15 mcg Nebulization BID   budesonide (PULMICORT) nebulizer solution  0.25 mg Nebulization BID   Chlorhexidine Gluconate Cloth  6 each Topical Daily   darbepoetin (ARANESP) injection - DIALYSIS  200 mcg Intravenous Q Sat-HD   HYDROmorphone   Intravenous Q4H   mouth rinse  15 mL Mouth Rinse BID   pantoprazole  40 mg Intravenous Q12H   revefenacin  175 mcg Nebulization Daily   sodium chloride flush  10-40 mL Intracatheter Q12H

## 2021-05-30 NOTE — Progress Notes (Signed)
eLink Physician-Brief Progress Note Patient Name: KENGO STURGES DOB: 13-Jan-1956 MRN: [id number]   Date of Service  05/30/2021  HPI/Events of Note  K+ 2.7, PHOS 1.5, patient is on TPN.  eICU Interventions  K+PHOS 15 mmol iv x 1 ordered.        Kerry Kass Yvaine Jankowiak 05/30/2021, 6:14 AM

## 2021-05-30 NOTE — Progress Notes (Signed)
Occupational Therapy Treatment Patient Details Name: Patrick [name] MRN: [id number] DOB: 1956/01/09 Today's Date: 05/30/2021   History of present illness 65 y.o. male admitted 12/2 with SBO. Pt recently hospitalized 11/22-11/26 at [facility] in [place] for same reason s/p ERCP. 12/7 pt to OR for small bowel resection and open cholecystectomy without closure and remained on vent, extubated 12/8.  PMHx: ESRD  on HD, anemia of CKD, CHF, Afib   OT comments  OT treatment session with focus on self-care re-education, functional transfers and mobility. Patient with good recall for abdominal precautions. Mod A to don footwear seated EOB. Bright red drainage present on chuck pad in recliner. RN made aware and further mobility deferred. OT will continue to follow acutely.    Recommendations for follow up therapy are one component of a multi-disciplinary discharge planning process, led by the attending physician.  Recommendations may be updated based on patient status, additional functional criteria and insurance authorization.    Follow Up Recommendations  Skilled nursing-short term rehab (<3 hours/day)    Assistance Recommended at Discharge Frequent or constant Supervision/Assistance  Equipment Recommendations  Other (comment) (TBD)    Recommendations for Other Services      Precautions / Restrictions Precautions Precautions: Fall;Other (comment) Precaution Comments: blind, NGT, open abdomen, L abdominal JP drain, PCA Restrictions Weight Bearing Restrictions: No       Mobility Bed Mobility Overal bed mobility: Needs Assistance Bed Mobility: Rolling;Sidelying to Sit Rolling: Min assist Sidelying to sit: Min assist       General bed mobility comments: Min A to elevate trunk with HOB elevated. Able to advance BLE from bed surface to EOB without external assist. Requires increased time/effort.    Transfers Overall transfer level: Needs assistance   Transfers: Sit to/from  Stand Sit to Stand: Min guard           General transfer comment: Sit to stand x2 from EOB and from low recliner. Patient reports mild dizziness upon standing. BP WNL.     Balance Overall balance assessment: Mild deficits observed, not formally tested Sitting-balance support: Feet supported Sitting balance-Leahy Scale: Good Sitting balance - Comments: good balance with no AD   Standing balance support: No upper extremity supported;During functional activity Standing balance-Leahy Scale: Fair                             ADL either performed or assessed with clinical judgement   ADL Overall ADL's : Needs assistance/impaired Eating/Feeding: NPO Eating/Feeding Details (indicate cue type and reason): Able to scoop ice chips and bring to mouth with spoon Grooming: Set up;Sitting Grooming Details (indicate cue type and reason): Declined oral hygiene standing at sink level.             Lower Body Dressing: Moderate assistance;Sit to/from stand Lower Body Dressing Details (indicate cue type and reason): Able to cross LLE over R knee to don sock. Difficutly crossing RLE over L knee requiring external assist. Toilet Transfer: Min guard Toilet Transfer Details (indicate cue type and reason): Simulated with transfer to recliner; HHA for steadying and 2/2 visual deficits.                Extremity/Trunk Assessment              Vision       Perception     Praxis      Cognition Arousal/Alertness: Awake/alert Behavior During Therapy: WFL for tasks assessed/performed Overall Cognitive Status:  Within Functional Limits for tasks assessed                                            Exercises     Shoulder Instructions       General Comments HR 90-100's at rest. 120's max with functional transfers. SpO2 99% on RA.    Pertinent Vitals/ Pain       Pain Assessment: 0-10 Pain Score: 8  Pain Location: abdomen Pain Descriptors / Indicators:  Guarding;Sore;Constant Pain Intervention(s): Limited activity within patient's tolerance;Monitored during session;Repositioned  Home Living                                          Prior Functioning/Environment              Frequency  Min 2X/week        Progress Toward Goals  OT Goals(current goals can now be found in the care plan section)  Progress towards OT goals: Progressing toward goals  Acute Rehab OT Goals Patient Stated Goal: To get better. OT Goal Formulation: With patient Time For Goal Achievement: 06/05/21 Potential to Achieve Goals: Good ADL Goals Pt Will Perform Grooming: with set-up;standing Pt Will Perform Lower Body Dressing: with modified independence;sit to/from stand;with adaptive equipment Pt Will Transfer to Toilet: with set-up;ambulating Pt Will Perform Toileting - Clothing Manipulation and hygiene: with modified independence;sit to/from stand;sitting/lateral leans Additional ADL Goal #1: Pt to increase activity tolerance > 7 min during functional tasks with stable vital signs  Plan Discharge plan remains appropriate;Frequency remains appropriate    Co-evaluation                 AM-PAC OT "6 Clicks" Daily Activity     Outcome Measure   Help from another person eating meals?: Total (NPO) Help from another person taking care of personal grooming?: A Little Help from another person toileting, which includes using toliet, bedpan, or urinal?: A Lot Help from another person bathing (including washing, rinsing, drying)?: A Lot Help from another person to put on and taking off regular upper body clothing?: A Little Help from another person to put on and taking off regular lower body clothing?: A Lot 6 Click Score: 13    End of Session Equipment Utilized During Treatment: Gait belt  OT Visit Diagnosis: Other abnormalities of gait and mobility (R26.89);Pain Pain - part of body:  (abdomen)   Activity Tolerance Patient  tolerated treatment well;No increased pain   Patient Left in chair;with call bell/phone within reach;with chair alarm set   Nurse Communication Mobility status;Other (comment) (Bright red drainage present on chuck pad.)        Time: 6295-2841 OT Time Calculation (min): 27 min  Charges: OT General Charges $OT Visit: 1 Visit OT Treatments $Self Care/Home Management : 8-22 mins $Therapeutic Activity: 8-22 mins  Arhum Peeples H. OTR/L Supplemental OT, Department of rehab services [phone]  Araminta Zorn R H. 05/30/2021, 10:21 AM

## 2021-05-30 NOTE — Progress Notes (Addendum)
Advanced Heart Failure Rounding Note  PCP-Cardiologist: Dr. Haroldine Laws   Subjective:    Echo 12/06: EF 40-45%, RV mildly reduced, RVSP 55 mmHg, severe MR, severe TR S/p lap, cholecystectomy and SB resection 12/07.  Extubated 12/8  12/11 CCM Re-consulted due bleeding. 2 U PRBCs 12/12 Hg down to 6.6. Given 1UPRBCs. EGD with actively bleeding AVM in stomach treated enodscopically  Hgb 7.0 this am. NGT with dark, old bloody output.  Still not passing gas. Reports mild abdominal discomfort.  K 2.7 this am. Replaced.   Objective:   Weight Range: 69.5 kg Body mass index is 21.37 kg/m.   Vital Signs:   Temp:  [97.6 F (36.4 C)-98.6 F (37 C)] 98.3 F (36.8 C) (12/13 0713) Pulse Rate:  [86-117] 96 (12/13 0900) Resp:  [9-37] 16 (12/13 0900) BP: (103-151)/(64-102) 130/72 (12/13 0900) SpO2:  [95 %-100 %] 98 % (12/13 0900) Weight:  [69.5 kg-71 kg] 69.5 kg (12/13 0315) Last BM Date: 05/22/21  Weight change: Filed Weights   05/29/21 1459 05/29/21 2217 05/30/21 0315  Weight: 69.9 kg 71 kg 69.5 kg    Intake/Output:   Intake/Output Summary (Last 24 hours) at 05/30/2021 1023 Last data filed at 05/30/2021 0900 Gross per 24 hour  Intake 2210.26 ml  Output 3170 ml  Net -959.74 ml      Physical Exam    General:  No distress. Sitting in chair. HEENT: blind. + NGT Neck: supple. JVP 10 cm. Carotids 2+ bilat; no bruits. No lymphadenopathy or thryomegaly appreciated. Cor: PMI nondisplaced. Regular rate & rhythm. No rubs, gallops or murmurs. Lungs: clear Abdomen: soft, nontender, mildly distended. Dressing in place.  Extremities: no cyanosis, clubbing, rash, edema, + RUE AVF Neuro: alert & orientedx3, cranial nerves grossly intact. moves all 4 extremities w/o difficulty. Affect pleasant   Telemetry   AF 80s-90s  Labs    CBC Recent Labs    05/28/21 2031 05/29/21 0340 05/29/21 2220 05/30/21 0427  WBC 13.0*   < > 13.1* 16.7*  NEUTROABS 10.3*  --   --   --   HGB  6.6*   < > 7.1* 7.0*  HCT 18.7*   < > 20.4* 20.9*  MCV 90.8   < > 86.1 87.4  PLT 93*   < > 110* 122*   < > = values in this interval not displayed.   Basic Metabolic Panel Recent Labs    05/29/21 0340 05/30/21 0427  NA 137 135  K 3.6 2.7*  CL 103 99  CO2 25 28  GLUCOSE 111* 118*  BUN 65* 24*  CREATININE 5.72* 2.72*  CALCIUM 8.3* 8.2*  MG 2.2 1.9  PHOS 3.1 1.5*   Liver Function Tests Recent Labs    05/29/21 0340 05/30/21 0427  AST 14* 18  ALT 6 9  ALKPHOS 79 99  BILITOT 1.4* 1.3*  PROT 4.6* 5.1*  ALBUMIN 1.5* 1.7*   No results for input(s): LIPASE, AMYLASE in the last 72 hours. Cardiac Enzymes No results for input(s): CKTOTAL, CKMB, CKMBINDEX, TROPONINI in the last 72 hours.  BNP: BNP (last 3 results) Recent Labs    08/01/20 2047  BNP 2,730.5*    ProBNP (last 3 results) No results for input(s): PROBNP in the last 8760 hours.   D-Dimer Recent Labs    05/29/21 1327  DDIMER >20.00*   Hemoglobin A1C No results for input(s): HGBA1C in the last 72 hours. Fasting Lipid Panel Recent Labs    05/29/21 0340  TRIG 48  Thyroid Function Tests No results for input(s): TSH, T4TOTAL, T3FREE, THYROIDAB in the last 72 hours.  Invalid input(s): FREET3  Other results:   Imaging    No results found.   Medications:     Scheduled Medications:  sodium chloride   Intravenous Once   arformoterol  15 mcg Nebulization BID   budesonide (PULMICORT) nebulizer solution  0.25 mg Nebulization BID   Chlorhexidine Gluconate Cloth  6 each Topical Daily   darbepoetin (ARANESP) injection - DIALYSIS  200 mcg Intravenous Q Sat-HD   HYDROmorphone   Intravenous Q4H   mouth rinse  15 mL Mouth Rinse BID   [START ON 06/01/2021] pantoprazole  40 mg Intravenous Q12H   revefenacin  175 mcg Nebulization Daily   sodium chloride flush  10-40 mL Intracatheter Q12H    Infusions:  methocarbamol (ROBAXIN) IV     pantoprazole 8 mg/hr (05/29/21 2300)   potassium PHOSPHATE IVPB  (in mmol) 43 mL/hr at 05/30/21 0900   TPN ADULT (ION) 65 mL/hr at 05/30/21 0900   TPN ADULT (ION)      PRN Medications: albuterol, hydrALAZINE, HYDROmorphone (DILAUDID) injection, methocarbamol (ROBAXIN) IV, ondansetron (ZOFRAN) IV, sodium chloride flush    Patient Profile   65 y/o chronically ill male with sickle cell dz, ESRD, chronic systolic HF EF 64%, pulmonary HTN, chronic AF and legal blindness. Admitted with recurrent SBO. We are asked to help manage AF with RVR and assess peri-operative risk.   Assessment/Plan    1. Persistent Atrial Fibrillation/AFL - S/p DCCV 05/2018 - S/P DCCV 10/06/19 NSR - Recurrence 05/21. Has been seen in AF Clinic and rate control strategy recommended - Now off IV amio - Holding anticoagulation due to recurrent bleeding and surgery    2. Chronic combined Systolic/Diastolic Heart Failure:  - NICM. Cath 05/2011 without significant coronary disease.   - ECHO 12/2015 40%.  - Echo 05/2018: EF 25-30%.  - Echo 3/21 LVEF 35-40%, low normal RV function,severe pulm HTN. Severe LAE, mod RAE, mod MR, severe TR. RV TAPSE 1.6, RV s' 10 - Echo 9/21 EF 40-45%, RV mildly reduced, mild RAE, severe pulm artery systolic pressure, severe TR - Echo this admit suggestive of restrictive CM with EF 40-45%, RV mildly decreased, severe MR, severe TR, RVSP 55 mmHg - Volume managed with HD on T,T,S - IV hydralazine PRN  - Off Entresto with ESRD   3. Anemia with H/o GI bleeding: - Prior EGD in 2019 showed nonbleeding gastritis, Colonoscopy showed small internal hemorrhoids, otherwise normal.  - GI bleed 02/22 - gastric erosions on EGD - A/C anemia 11/22 - transfused with 3 uPRBCs - A/C anemia, received 5 u PRBcs so far this admit, 2 u FFP - 12/11 started bleeding again. Hemoptysis. Given 2 U PRBcs - 12/11 1 U PRBCs. EGD with bleeding AVM in stomach treated with APC and 3 clips - Hgb 7.0 Transfuse if drops < 7 - Received aranesp - Holding anticoagulation. May need to  weigh risks/benefits of resuming anticoagulation with hx recurrent bleeding.    4. SBO/gangrenous cholecystitis - s/p lap, cholecystectomy and SB resection 12/07 - Post op ileus, still has NGT - Has open abdominal wound - Remains distended.  - Now off abx   6. Pulmonary HTN  - Last echo 09/21 with EF 40-45%, RV mildly reduced, RVSP 75 mmHg, severe TR - Repeat echo this admit with EF 40-45%, RV mildly reduced, severe MR, severe TR, RVSP 55 mmHg   7. Abdominal ascites -Noted on CT -Mild hepatic nodularity  present   8. HTN: - BP stable - PRN IV hydralazine    9.  Sickle Cell:   - Follows closely with [facility].    98. Former smoker:   - Quit 11/2015.   11. ESRD - On HD now T/T/S - Nephrology following  12. Hypokalemia - K 2.7 this am. - Replaced  - Recheck labs   Length of Stay: Lambert, LINDSAY N, PA-C  05/30/2021, 10:23 AM  Advanced Heart Failure Team Pager [phone] (M-F; 7a - 5p)  Please contact [facility] for night-coverage after hours (5p -7a ) and weekends on [email or web address]  Patient seen and examined with the above-signed Advanced Practice Provider and/or Housestaff. I personally reviewed laboratory data, imaging studies and relevant notes. I independently examined the patient and formulated the important aspects of the plan. I have edited the note to reflect any of my changes or salient points. I have personally discussed the plan with the patient and/or family.  EGD yesterday with esophagitis and prominent gastric AVM which was APC'd. Hgb 7.0 this am. No evidence of ongoing bleeding. Still with NGT in place with 75cc old blood.  Remains in AF. Rate controlled. Volume status controlled with HD  General:  Sitting up No resp difficulty HEENT: normal + NGT Neck: supple. No jvp  Carotids 2+ bilat; no bruits. No lymphadenopathy or thryomegaly appreciated. Cor: PMI nondisplaced. Irregular rate & rhythm. No rubs, gallops or murmurs. Lungs: clear Abdomen:  soft, nontender, + distended. Dressing in place. Quiet Extremities: no cyanosis, clubbing, rash, edema Neuro: alert & orientedx3, cranial nerves grossly intact. moves all 4 extremities w/o difficulty. Affect pleasant  Remains stable from cardiac perspective. Can keep off amio for now. Continue volume management with HD.   Glori Bickers, MD  12:04 PM

## 2021-05-30 NOTE — Anesthesia Postprocedure Evaluation (Signed)
Anesthesia Post Note  Patient: Patrick [name]  Procedure(s) Performed: ESOPHAGOGASTRODUODENOSCOPY (EGD) STENT REMOVAL SCLEROTHERAPY HOT HEMOSTASIS (ARGON PLASMA COAGULATION/BICAP) HEMOSTASIS CLIP PLACEMENT     Patient location during evaluation: PACU Anesthesia Type: General Level of consciousness: awake Pain management: pain level controlled Vital Signs Assessment: post-procedure vital signs reviewed and stable Respiratory status: spontaneous breathing, nonlabored ventilation, respiratory function stable and patient connected to nasal cannula oxygen Cardiovascular status: blood pressure returned to baseline and stable Postop Assessment: no apparent nausea or vomiting Anesthetic complications: no   No notable events documented.  Last Vitals:  Vitals:   05/30/21 1534 05/30/21 1600  BP:  110/71  Pulse:    Resp:  (!) 8  Temp: 36.7 C   SpO2:  97%    Last Pain:  Vitals:   05/30/21 1600  TempSrc:   PainSc: 7                  Diar Berkel P Milissa Fesperman

## 2021-05-31 ENCOUNTER — Encounter (HOSPITAL_COMMUNITY): Payer: Self-pay | Admitting: Gastroenterology

## 2021-05-31 DIAGNOSIS — K567 Ileus, unspecified: Secondary | ICD-10-CM

## 2021-05-31 DIAGNOSIS — K9189 Other postprocedural complications and disorders of digestive system: Secondary | ICD-10-CM

## 2021-05-31 LAB — COMPREHENSIVE METABOLIC PANEL
ALT: 9 U/L (ref 0–44)
AST: 19 U/L (ref 15–41)
Albumin: 1.6 g/dL — ABNORMAL LOW (ref 3.5–5.0)
Alkaline Phosphatase: 110 U/L (ref 38–126)
Anion gap: 8 (ref 5–15)
BUN: 47 mg/dL — ABNORMAL HIGH (ref 8–23)
CO2: 26 mmol/L (ref 22–32)
Calcium: 8.3 mg/dL — ABNORMAL LOW (ref 8.9–10.3)
Chloride: 100 mmol/L (ref 98–111)
Creatinine, Ser: 4.24 mg/dL — ABNORMAL HIGH (ref 0.61–1.24)
GFR, Estimated: 15 mL/min — ABNORMAL LOW (ref >60)
Glucose, Bld: 105 mg/dL — ABNORMAL HIGH (ref 70–99)
Potassium: 3.4 mmol/L — ABNORMAL LOW (ref 3.5–5.1)
Sodium: 134 mmol/L — ABNORMAL LOW (ref 135–145)
Total Bilirubin: 2.1 mg/dL — ABNORMAL HIGH (ref 0.3–1.2)
Total Protein: 4.8 g/dL — ABNORMAL LOW (ref 6.5–8.1)

## 2021-05-31 LAB — TYPE AND SCREEN
ABO/RH(D): AB POS
Antibody Screen: NEGATIVE
Unit division: 0
Unit division: 0
Unit division: 0
Unit division: 0

## 2021-05-31 LAB — BPAM RBC
Blood Product Expiration Date: 202212312359
Blood Product Expiration Date: 202212312359
Blood Product Expiration Date: 202212312359
Blood Product Expiration Date: 202212312359
ISSUE DATE / TIME: 202212111718
ISSUE DATE / TIME: 202212112338
ISSUE DATE / TIME: 202212120810
ISSUE DATE / TIME: 202212132051
Unit Type and Rh: 600
Unit Type and Rh: 600
Unit Type and Rh: 600
Unit Type and Rh: 600

## 2021-05-31 LAB — CBC
HCT: 24.4 % — ABNORMAL LOW (ref 39.0–52.0)
HCT: 24.7 % — ABNORMAL LOW (ref 39.0–52.0)
Hemoglobin: 8.4 g/dL — ABNORMAL LOW (ref 13.0–17.0)
Hemoglobin: 8.3 g/dL — ABNORMAL LOW (ref 13.0–17.0)
MCH: 29.8 pg (ref 26.0–34.0)
MCH: 29.7 pg (ref 26.0–34.0)
MCHC: 34.4 g/dL (ref 30.0–36.0)
MCHC: 33.6 g/dL (ref 30.0–36.0)
MCV: 86.5 fL (ref 80.0–100.0)
MCV: 88.5 fL (ref 80.0–100.0)
Platelets: 158 10*3/uL (ref 150–400)
Platelets: 143 10*3/uL — ABNORMAL LOW (ref 150–400)
RBC: 2.82 MIL/uL — ABNORMAL LOW (ref 4.22–5.81)
RBC: 2.79 MIL/uL — ABNORMAL LOW (ref 4.22–5.81)
RDW: 20.4 % — ABNORMAL HIGH (ref 11.5–15.5)
RDW: 19.9 % — ABNORMAL HIGH (ref 11.5–15.5)
WBC: 17.5 10*3/uL — ABNORMAL HIGH (ref 4.0–10.5)
WBC: 14.1 10*3/uL — ABNORMAL HIGH (ref 4.0–10.5)
nRBC: 0.4 % — ABNORMAL HIGH (ref 0.0–0.2)
nRBC: 0.4 % — ABNORMAL HIGH (ref 0.0–0.2)

## 2021-05-31 LAB — GLUCOSE, CAPILLARY
Glucose-Capillary: 111 mg/dL — ABNORMAL HIGH (ref 70–99)
Glucose-Capillary: 106 mg/dL — ABNORMAL HIGH (ref 70–99)
Glucose-Capillary: 108 mg/dL — ABNORMAL HIGH (ref 70–99)
Glucose-Capillary: 110 mg/dL — ABNORMAL HIGH (ref 70–99)
Glucose-Capillary: 109 mg/dL — ABNORMAL HIGH (ref 70–99)

## 2021-05-31 LAB — CBC WITH DIFFERENTIAL/PLATELET
Abs Immature Granulocytes: 0.23 10*3/uL — ABNORMAL HIGH (ref 0.00–0.07)
Basophils Absolute: 0.1 10*3/uL (ref 0.0–0.1)
Basophils Relative: 0 %
Eosinophils Absolute: 0.4 10*3/uL (ref 0.0–0.5)
Eosinophils Relative: 3 %
HCT: 22.5 % — ABNORMAL LOW (ref 39.0–52.0)
Hemoglobin: 7.9 g/dL — ABNORMAL LOW (ref 13.0–17.0)
Immature Granulocytes: 2 %
Lymphocytes Relative: 4 %
Lymphs Abs: 0.6 10*3/uL — ABNORMAL LOW (ref 0.7–4.0)
MCH: 30.5 pg (ref 26.0–34.0)
MCHC: 35.1 g/dL (ref 30.0–36.0)
MCV: 86.9 fL (ref 80.0–100.0)
Monocytes Absolute: 1.7 10*3/uL — ABNORMAL HIGH (ref 0.1–1.0)
Monocytes Relative: 12 %
Neutro Abs: 11.4 10*3/uL — ABNORMAL HIGH (ref 1.7–7.7)
Neutrophils Relative %: 79 %
Platelets: 132 10*3/uL — ABNORMAL LOW (ref 150–400)
RBC: 2.59 MIL/uL — ABNORMAL LOW (ref 4.22–5.81)
RDW: 19.4 % — ABNORMAL HIGH (ref 11.5–15.5)
WBC: 14.4 10*3/uL — ABNORMAL HIGH (ref 4.0–10.5)
nRBC: 0.3 % — ABNORMAL HIGH (ref 0.0–0.2)

## 2021-05-31 LAB — IRON AND TIBC
Iron: 80 ug/dL (ref 45–182)
Saturation Ratios: 34 % (ref 17.9–39.5)
TIBC: 232 ug/dL — ABNORMAL LOW (ref 250–450)
UIBC: 152 ug/dL

## 2021-05-31 LAB — FERRITIN: Ferritin: 250 ng/mL (ref 24–336)

## 2021-05-31 LAB — MAGNESIUM: Magnesium: 2.3 mg/dL (ref 1.7–2.4)

## 2021-05-31 LAB — PHOSPHORUS: Phosphorus: 3.7 mg/dL (ref 2.5–4.6)

## 2021-05-31 MED ORDER — POTASSIUM CHLORIDE 10 MEQ/50ML IV SOLN
10.0000 meq | INTRAVENOUS | Status: AC
  Administered 2021-05-31 (×3): 10 meq via INTRAVENOUS
  Filled 2021-05-31 (×3): qty 50

## 2021-05-31 MED ORDER — TRACE MINERALS CU-MN-SE-ZN 300-55-60-3000 MCG/ML IV SOLN
INTRAVENOUS | Status: AC
  Filled 2021-05-31: qty 676

## 2021-05-31 MED ORDER — POLYVINYL ALCOHOL 1.4 % OP SOLN
1.0000 [drp] | OPHTHALMIC | Status: DC | PRN
  Filled 2021-05-31: qty 15

## 2021-05-31 NOTE — Progress Notes (Signed)
[facility] Progress Note  Subjective: I/O -1.4 L yesterday w/ NG and drains output + UF on HD. Hb up 7.9 today. Other labs ok. Bp's stable, afib per tele. On Room air.    Objective Vital signs in last 24 hours:       Vitals:    05/30/21 0900 05/30/21 1000 05/30/21 1100 05/30/21 1123  BP: 130/72 133/78 125/76    Pulse: 96 91 95    Resp: 16 11 12     Temp:       98.4 F (36.9 C)  TempSrc:       Oral  SpO2: 98% 97% 94%    Weight:          Height:              Physical Exam: General: thin chronically ill male, on room air, NG in place Heart: RRR no MRG noted Lungs: coarse BS b/l Abdomen: incisions bandaged Extremities: No pedal edema  dialysis Access: RUA aVF+ bruit   OP HD: TTS AF   4h  400/500   65kg  2/2 bath  P2  AVF  Hep none - Sensipar 30mg  PO q HD - Venofer 50mg  IV weekly - Mircera 222mcg IV q 2 weeks (last 11/8)   Assessment/ Plan SBO/ recent gallstone with CBD &pancreatic duct stenting: s/p ex lap here 12/07 w/ cholecystectomy and SB resection (gangrenous GB and chronic SB ischemia). Remains on TPN, per gen surgery.  UGIB - d/t AVM in stomach, rx'd by GI w/ APC/ endoclips/ epi sq on 12/12  ESRD: HD tomorrow (wed) off schedule.  HTN/volume: BP stable, +4kg, UF goal 2.5 L w/ HD tomorrow Anemia CKD/ sickle cell/ abl- Aranesp with HD tomorrow.  Check Fe values. Hb 7.9 today, sp 9u prbc's total here.  Hypokalemia - will get approx 40 mEq today altogether (TPN, Kphos and 1 run KCl IV)  Metabolic bone disease: Ca/Phos ok - resume binders when eating  Nutrition: on TPN,+ NGT, will order supplements when cleared to eat.  A-fib: per cardiology    Kelly Splinter, MD 05/30/2021, 12:16 PM     Recent Labs  Lab 05/30/21 1205 05/30/21 1627 05/30/21 1914 05/31/21 0238  K 3.4*  --   --  3.4*  BUN 33*  --   --  47*  CREATININE 3.18*  --   --  4.24*  CALCIUM 8.7*  --   --  8.3*  PHOS  --   --  3.2 3.7  HGB 8.4* 6.5*  --  7.9*   Inpatient medications:   arformoterol  15 mcg Nebulization BID   budesonide (PULMICORT) nebulizer solution  0.25 mg Nebulization BID   Chlorhexidine Gluconate Cloth  6 each Topical Daily   darbepoetin (ARANESP) injection - DIALYSIS  200 mcg Intravenous Q Wed-HD   HYDROmorphone   Intravenous Q4H   mouth rinse  15 mL Mouth Rinse BID   pantoprazole  40 mg Intravenous Q12H   revefenacin  175 mcg Nebulization Daily   sodium chloride flush  10-40 mL Intracatheter Q12H    methocarbamol (ROBAXIN) IV Stopped (05/30/21 2236)   potassium chloride     TPN ADULT (ION) 65 mL/hr at 05/31/21 0400   TPN ADULT (ION)     albuterol, hydrALAZINE, ondansetron (ZOFRAN) IV, polyvinyl alcohol, simethicone, sodium chloride flush

## 2021-05-31 NOTE — Progress Notes (Signed)
NG tube clammed at this time, will leave clamed x6hours per order.

## 2021-05-31 NOTE — Progress Notes (Signed)
Nutrition Follow-up  DOCUMENTATION CODES:   Severe malnutrition in context of acute illness/injury  INTERVENTION:   - Continue TPN to meet 100% of estimated needs  - RD will monitor for diet advancement and add oral nutrition supplements as able  NUTRITION DIAGNOSIS:   Severe Malnutrition related to acute illness (SBO) as evidenced by moderate fat depletion, moderate muscle depletion, percent weight loss (8% weight loss within 3 months).  Ongoing, being addressed via TPN  GOAL:   Patient will meet greater than or equal to 90% of their needs  Met via TPN  MONITOR:   Labs, I & O's, Diet advancement  REASON FOR ASSESSMENT:   Consult New TPN/TNA  ASSESSMENT:   65 yo male admitted with SBO. PMH includes ESRD on HD, recent SBO (hospitalized at [facility] in [place] 1/22-11/26), CHF, global hypokinesis, mitral regurgitation, legally blind, sickle cell disease.  12/06 - TPN start 12/07 - s/p diagnostic laparoscopy, SBR with primary anastomosis, open cholecystectomy 12/08 - extubated 12/11 - persistent bloody drainage from NG tube 12/12 - s/p upper endoscopy showing ulcerative esophagitis, actively bleeding gastric AVM s/p APC and clip 12/13 - NG tube clamped, later put back to suction 12/14 - ongoing NG tube clamping trials  Pt's NG tube remains in place. Per Surgery, going to try clamping trials again today. Pt did not tolerate clamping yesterday due to pain. Pt currently in HD.  TPN currently infusing at goal rate of 65 ml/hr which provides 1912 kcal and 101 grams of protein, meeting 100% of pt's estimated needs.  EDW: 65 kg Admit weight: 67.9 kg Current weight: 68.9 kg  Medications reviewed and include: aranesp weekly, IV protonix, TPN  Labs reviewed: sodium 134, potassium 3.4, hemoglobin 7.9, WBC 14.4, platelets 132 CBG's: 107-134 x 24 hours  UOP: 350 ml x 24 hours NGT: 1050 ml x 24 hours Abd JP drain: 1185 ml x 24 hours I/O's: +4.2 L since admit  Diet  Order:   Diet Order             Diet NPO time specified Except for: Ice Chips  Diet effective now                   EDUCATION NEEDS:   Not appropriate for education at this time  Skin:  Skin Assessment: Skin Integrity Issues: Incisions: abdomen  Last BM:  05/22/21  Height:   Ht Readings from Last 1 Encounters:  05/29/21 5' 11"  (1.803 m)    Weight:   Wt Readings from Last 1 Encounters:  05/31/21 68.9 kg    BMI:  Body mass index is 21.19 kg/m.  Estimated Nutritional Needs:   Kcal:  1900-2100  Protein:  100-115 gm  Fluid:  1.9 L    Gustavus Bryant, MS, RD, LDN Inpatient Clinical Dietitian Please see AMiON for contact information.

## 2021-05-31 NOTE — TOC Progression Note (Addendum)
Transition of Care [facility]) - Progression Note    Patient Details  Name: RENNIE ROUCH MRN: [id number] Date of Birth: 22-Feb-1956  [facility] CM/SW Contact  Nettye Flegal, LCSW Phone Number: 05/31/2021, 2:47 PM  Clinical Narrative:    NG tube still in therefore FL2 cannot be faxed out until NG tube removed for SNF bed offers.  CSW will continue to follow throughout discharge.   Expected Discharge Plan: [place] Barriers to Discharge: Continued Medical Work up  Expected Discharge Plan and Services Expected Discharge Plan: [place] In-house Referral: Clinical Social Work     Living arrangements for the past 2 months: Single Family Home                                       Social Determinants of Health (SDOH) Interventions    Readmission Risk Interventions Readmission Risk Prevention Plan 08/05/2020  Transportation Screening Complete  PCP or Specialist Appt within 3-5 Days Complete  HRI or [place] Complete  Social Work Consult for [place] Planning/Counseling Complete  Palliative Care Screening Not Applicable  Medication Review [employer]) Complete  Some recent data might be hidden   [place], MSW, LCSWA [phone] Heart Failure Social Worker

## 2021-05-31 NOTE — Progress Notes (Signed)
PHARMACY - TOTAL PARENTERAL NUTRITION CONSULT NOTE  Indication: Small bowel obstruction  Patient Measurements: Height: 5\' 11"  (180.3 cm) Weight: 68.9 kg (151 lb 14.4 oz) IBW/kg (Calculated) : 75.3 TPN AdjBW (KG): 69.9 Body mass index is 21.19 kg/m. Usual Weight: 154 lb 02/28/21  Assessment:  55 yom with ESRD admitted with worsening abdominal pain, found to have high-grade SBO. He had recent admission at [facility] for SBO resolved with conservative management; also had choledocholithiasis s/p ERCP with stone extraction, stent to CBD and PD. Lap chole recommended but declined. Surgery reports no flatus or BM except with enema on 12/5 and unlikely to resolve without surgical intervention. Given prolonged NPO status, pharmacy consulted for TPN for nutritional support. At risk for refeeding syndrome.  Glucose / Insulin: no hx DM, CBGs controlled. SSI d/c'd 12/10 Electrolytes: labs post HD (2K bath on 12/12) - K 3.4 and HD 4K bath today (goal >/= 4 for Afib/ileus), Phos up 3.7 post replacement, Mag 2.3 (goal >/= 2), Na trending down 134, others stable WNL Renal: ESRD on HD TTS - off schedule Hepatic: LFTs / TG WNL, tbili back up to 2.1 today (no evidence of jaundice on exam 12/14), albumin 1.6 Intake / Output; MIVF: UOP 654mL, NG 86mL, drain 9mL GI Imaging:  12/3 Abd XR: Probable persistent SBO not well evaluated 12/4 CT A/P: diffusely dilated SB with decompressed terminal ileum c/w SBO 12/6 Abd XR: dilation of small-bowel loops suggesting ileus or pSBO 12/7 Abd XR: unchanged 12/11 Abx XR: increased in dilated bowel loops 12/12 CT: small volume perihepatic ascites GI Surgeries / Procedures:  12/7 cholecystectomy with SBR 12/12 EGD: ulcerative esophagitis, bleeding AVM post clip/epi/APC  Central access: IJ placed by IR 05/23/21 TPN start date: 05/23/21  Nutritional Goals: RD Estimated Needs Total Energy Estimated Needs: 1900-2100 Total Protein Estimated Needs: 100-115 gm Total Fluid  Estimated Needs: 1.9 L  Current Nutrition:  TPN  Plan:  Continue concentrated TPN at goal rate of 65 mL/hr to provide 101g AA, 273g CHO and 58g ILE for a total of 1912 kCal, meeting 100% of needs Electrolytes in TPN: increase Na 184mEq/L; K 41mEq/L added 12/12 (= 11 mEq/day), Ca 62mEq/L, Mag reduced slightly to 45mEq/L on 12/12, Phos 55mmol/L, change Cl:Ac 2:1 Add standard MVI and trace elements to TPN; remove chromium in patient on RRT F/U TPN labs Mon/Thurs, AM labs ordered  Trial of clamping NG tube (6hrs off, 2hrs on) today. Plan to DC JP drain tomorrow. Follow-up toleration.   Sloan Leiter, PharmD, BCPS, BCCCP Clinical Pharmacist Please refer to [facility] for [place] numbers 05/31/2021, 7:10 AM

## 2021-05-31 NOTE — Progress Notes (Addendum)
Advanced Heart Failure Rounding Note  PCP-Cardiologist: Dr. Haroldine Laws   Subjective:    Echo 12/06: EF 40-45%, RV mildly reduced, RVSP 55 mmHg, severe MR, severe TR S/p lap, cholecystectomy and SB resection 12/07.  Extubated 12/8  12/11 CCM Re-consulted due bleeding. 2 U PRBCs 12/12 Hg down to 6.6. Given 1UPRBCs. EGD with actively bleeding AVM in stomach treated endoscopically, nonbleeding ulcerative esophagitis 12/13 1 u PRBCs  Still has NGT. 300 cc out so far today.   BP stable.  Weight down 5 lb after HD.  Feels okay. Not passing gas. Mild abdominal discomfort.  Objective:   Weight Range: 66.3 kg Body mass index is 20.39 kg/m.   Vital Signs:   Temp:  [97.5 F (36.4 C)-98.9 F (37.2 C)] 98 F (36.7 C) (12/14 1119) Pulse Rate:  [68-107] 87 (12/14 1119) Resp:  [8-22] 22 (12/14 1149) BP: (106-128)/(59-75) 125/75 (12/14 1119) SpO2:  [92 %-100 %] 98 % (12/14 1149) FiO2 (%):  [0 %] 0 % (12/14 1149) Weight:  [66.3 kg-68.9 kg] 66.3 kg (12/14 1052) Last BM Date: 05/22/21  Weight change: Filed Weights   05/30/21 0315 05/31/21 0337 05/31/21 1052  Weight: 69.5 kg 68.9 kg 66.3 kg    Intake/Output:   Intake/Output Summary (Last 24 hours) at 05/31/2021 1333 Last data filed at 05/31/2021 1057 Gross per 24 hour  Intake 2041.12 ml  Output 4705 ml  Net -2663.88 ml      Physical Exam    General:  Thin, chronically ill appearing. No distress.  HEENT: + NGT. Blind. Neck: supple. no JVD. Carotids 2+ bilat; no bruits.  Cor: PMI nondisplaced. Irregular rhythm. No rubs, gallops, 3/6 HSM at apex Lungs: clear Abdomen: soft, nontender, + distended. No hepatosplenomegaly.  Extremities: no cyanosis, clubbing, rash, edema, + RUE AVF Neuro: alert & orientedx3, cranial nerves grossly intact. moves all 4 extremities w/o difficulty. Affect pleasant    Telemetry   AF 80s-90s (personally reviewed)  Labs    CBC Recent Labs    05/28/21 2031 05/29/21 0340 05/30/21 1627  05/31/21 0238  WBC 13.0*   < > 15.1* 14.4*  NEUTROABS 10.3*  --   --  11.4*  HGB 6.6*   < > 6.5* 7.9*  HCT 18.7*   < > 19.6* 22.5*  MCV 90.8   < > 87.5 86.9  PLT 93*   < > 131* 132*   < > = values in this interval not displayed.   Basic Metabolic Panel Recent Labs    05/30/21 0427 05/30/21 1205 05/30/21 1914 05/31/21 0238  NA 135 138  --  134*  K 2.7* 3.4*  --  3.4*  CL 99 100  --  100  CO2 28 26  --  26  GLUCOSE 118* 111*  --  105*  BUN 24* 33*  --  47*  CREATININE 2.72* 3.18*  --  4.24*  CALCIUM 8.2* 8.7*  --  8.3*  MG 1.9  --   --  2.3  PHOS 1.5*  --  3.2 3.7   Liver Function Tests Recent Labs    05/30/21 0427 05/31/21 0238  AST 18 19  ALT 9 9  ALKPHOS 99 110  BILITOT 1.3* 2.1*  PROT 5.1* 4.8*  ALBUMIN 1.7* 1.6*   No results for input(s): LIPASE, AMYLASE in the last 72 hours. Cardiac Enzymes No results for input(s): CKTOTAL, CKMB, CKMBINDEX, TROPONINI in the last 72 hours.  BNP: BNP (last 3 results) Recent Labs    08/01/20 2047  BNP 2,730.5*    ProBNP (last 3 results) No results for input(s): PROBNP in the last 8760 hours.   D-Dimer Recent Labs    05/29/21 1327  DDIMER >20.00*   Hemoglobin A1C No results for input(s): HGBA1C in the last 72 hours. Fasting Lipid Panel Recent Labs    05/29/21 0340  TRIG 48    Thyroid Function Tests No results for input(s): TSH, T4TOTAL, T3FREE, THYROIDAB in the last 72 hours.  Invalid input(s): FREET3  Other results:   Imaging    No results found.   Medications:     Scheduled Medications:  arformoterol  15 mcg Nebulization BID   budesonide (PULMICORT) nebulizer solution  0.25 mg Nebulization BID   Chlorhexidine Gluconate Cloth  6 each Topical Daily   darbepoetin (ARANESP) injection - DIALYSIS  200 mcg Intravenous Q Wed-HD   HYDROmorphone   Intravenous Q4H   mouth rinse  15 mL Mouth Rinse BID   pantoprazole  40 mg Intravenous Q12H   revefenacin  175 mcg Nebulization Daily   sodium  chloride flush  10-40 mL Intracatheter Q12H    Infusions:  methocarbamol (ROBAXIN) IV 1,000 mg (05/31/21 1320)   potassium chloride 10 mEq (05/31/21 1317)   TPN ADULT (ION) 65 mL/hr at 05/31/21 0400   TPN ADULT (ION)      PRN Medications: albuterol, hydrALAZINE, ondansetron (ZOFRAN) IV, polyvinyl alcohol, simethicone, sodium chloride flush    Patient Profile   65 y/o chronically ill male with sickle cell dz, ESRD, chronic systolic HF EF 62%, pulmonary HTN, chronic AF and legal blindness. Admitted with recurrent SBO and a/c GI bleeding. We are asked to help manage AF with RVR and assess peri-operative risk.   Assessment/Plan    1. Persistent Atrial Fibrillation/AFL - S/p DCCV 05/2018 - S/P DCCV 10/06/19 NSR - Recurrence 05/21. Has been seen in AF Clinic and rate control strategy recommended - Now off IV amio - Holding anticoagulation due to recurrent bleeding and surgery    2. Chronic combined Systolic/Diastolic Heart Failure:  - NICM. Cath 05/2011 without significant coronary disease.   - ECHO 12/2015 40%.  - Echo 05/2018: EF 25-30%.  - Echo 3/21 LVEF 35-40%, low normal RV function,severe pulm HTN. Severe LAE, mod RAE, mod MR, severe TR. RV TAPSE 1.6, RV s' 10 - Echo 9/21 EF 40-45%, RV mildly reduced, mild RAE, severe pulm artery systolic pressure, severe TR - Echo this admit suggestive of restrictive CM with EF 40-45%, RV mildly decreased, severe MR, severe TR, RVSP 55 mmHg - Volume managed with HD on T,T,S - IV hydralazine PRN  - Off Entresto with ESRD   3. Anemia with H/o GI bleeding: - Prior EGD in 2019 showed nonbleeding gastritis, Colonoscopy showed small internal hemorrhoids, otherwise normal.  - GI bleed 02/22 - gastric erosions on EGD - A/C anemia 11/22 - transfused with 3 uPRBCs - 12/11 started bleeding again. Hemoptysis. Given 2 U PRBcs - 12/11 1 U PRBCs. EGD with bleeding AVM in stomach treated with APC and 3 clips, nonbleeding ulcerative gastritis - A/C  anemia, received 9 u PRBcs so far this admit (1 u 12/12 and 1 u 12/13), 2 u FFP - Hgb 7.9 today. Transfuse if drops < 7 - Receiving aranesp - On IV protonix - Holding anticoagulation. Need to weigh risks/benefits of resuming anticoagulation with hx recurrent bleeding.    4. SBO/gangrenous cholecystitis - s/p lap, cholecystectomy and SB resection 12/07 - Post op ileus, still has NGT. Attempting clamping trials today. - GI/surgery  considering CT scan if no sign of improvement in next days - Has open abdominal wound - Now off abx   6. Pulmonary HTN  - Last echo 09/21 with EF 40-45%, RV mildly reduced, RVSP 75 mmHg, severe TR - Echo this admit with EF 40-45%, RV mildly reduced, severe MR, severe TR, RVSP 55 mmHg   7. Abdominal ascites -Noted on CT -Mild hepatic nodularity present   8. HTN: - BP stable - PRN IV hydralazine    9.  Sickle Cell:   - Follows closely with [facility].    37. Former smoker:   - Quit 11/2015.   11. ESRD - On HD now T/T/S - Nephrology following  12. Hypokalemia - K 3.4 - Supp   Length of Stay: 12  FINCH, LINDSAY N, PA-C  05/31/2021, 1:33 PM  Advanced Heart Failure Team Pager [phone] (M-F; 7a - 5p)  Please contact [facility] for night-coverage after hours (5p -7a ) and weekends on [email or web address]   Patient seen and examined with the above-signed Advanced Practice Provider and/or Housestaff. I personally reviewed laboratory data, imaging studies and relevant notes. I independently examined the patient and formulated the important aspects of the plan. I have edited the note to reflect any of my changes or salient points. I have personally discussed the plan with the patient and/or family.  Post-op ileus persists with dark bloody drainage. Volume status maintained with HD. AF is rate controlled.  General:  Weak appearing. No resp difficulty HEENT: normal + NG Neck: supple. no JVD. Carotids 2+ bilat; no bruits. No lymphadenopathy or  thryomegaly appreciated. Cor: PMI nondisplaced. Irregular rate & rhythm. No rubs, gallops or murmurs. Lungs: clear Abdomen: soft, +distended. Minimal bowel sounds. + dressing Extremities: no cyanosis, clubbing, rash, edema Neuro: alert & orientedx3, cranial nerves grossly intact. moves all 4 extremities w/o difficulty. Affect pleasant  Stable hemodynamically. AF is rate controlled. Agree with continuing to hold antocoagulation.   Glori Bickers, MD  10:26 PM

## 2021-05-31 NOTE — Progress Notes (Signed)
°   05/31/21 1052  Vitals  Temp (!) 97.5 F (36.4 C)  Temp Source Oral  BP 119/70  BP Location Left Arm  BP Method Automatic  Patient Position (if appropriate) Lying  Pulse Rate 85  Pulse Rate Source Monitor  Resp 18  Oxygen Therapy  SpO2 100 %  O2 Device Room Air  Dialysis Weight  Weight 66.3 kg  Type of Weight Post-Dialysis  Post-Hemodialysis Assessment  Rinseback Volume (mL) 250 mL  KECN 285 V  Dialyzer Clearance Lightly streaked  Duration of HD Treatment -hour(s) 3.5 hour(s)  Hemodialysis Intake (mL) 500 mL  UF Total -Machine (mL) 3500 mL  Net UF (mL) 3000 mL  Tolerated HD Treatment Yes  Post-Hemodialysis Comments tx complete-pt stable  AVG/AVF Arterial Site Held (minutes) 10 minutes  AVG/AVF Venous Site Held (minutes) 10 minutes  Fistula / Graft Right Upper arm  No placement date or time found.   Orientation: Right  Access Location: Upper arm  Site Condition No complications  Fistula / Graft Assessment Present;Thrill;Bruit  Status Deaccessed  Tx complete-pt stable. No problems noted throughout tx.

## 2021-05-31 NOTE — Progress Notes (Addendum)
PROGRESS NOTE   Patrick [name]  WGN:[id number]    DOB: 1955/11/25    DOA: 05/19/2021  PCP: Dorena Dew, FNP   I have briefly reviewed patients previous medical records in [facility].  Chief Complaint  Patient presents with   Small Bowel Obstruction    Brief Narrative:  65 year old male with medical history significant for ESRD on TTS HD, chronic systolic CHF, COPD, pulmonary hypertension, sickle cell anemia, chronic A. fib on Eliquis, SBO, choledocholithiasis, presented to [facility] on 05/19/2021 with recurrent SBO and choledocholithiasis.  On 05/09/2021, patient was hospitalized at [place] with SBO and choledocholithiasis.  SBO resolved with conservative measures and choledocholithiasis improved with ERCP and stent placement.    12/7: Underwent exploratory laparotomy, surgical resection of SBO and cholecystectomy.  Postprocedure, patient remained on vasopressors and intubated.  Extubated 12/8 and transferred to [facility] on 12/9.  12/11, noted persistent bloody drainage from NGT, general surgery and [facility] on board, s/p DDAVP and Protonix infusion.  12/12: EGD showed significant amount of blood in the stomach, lavaged and AVM bleeding, treated endoscopically.  Care transferred back from [facility] to [facility] on 12/14.   Assessment & Plan:  Principal Problem:   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 05/24/2021 Active Problems:   Abdominal pain   Nausea & vomiting   Anemia   End stage renal disease (HCC)   Permanent atrial fibrillation (HCC)   Chronic systolic CHF (congestive heart failure) (HCC)   Upper GI bleed   SBO (small bowel obstruction) (HCC)   Protein-calorie malnutrition, severe   ABLA (acute blood loss anemia)   Empyema of gallbladder   Acute gangrenous cholecystitis s/p open cholcystectomy 05/24/2021   SBO due to chronic ischemic segment of SB, postop ileus: 12/7: S/p diagnostic laparoscopy converted to exploratory laparotomies for small bowel  resection and cholecystectomy for gangrenous cholecystitis and chronic ischemic segment of SB causing an obstruction.  General surgery continues to follow.  Twice daily dressing changes.  Prolonged postop ileus.  Has NG tube with dark bloody drainage.  TNA for nutritional support.  Completed 5 days of Zosyn 12/12.    Gangrenous cholecystitis: 12/7 cholecystectomy; recent Choledocholithiasis s/p recent ERCP stent placement at [facility]  Acute upper GI bleed/NGT bleeding S/p DDAVP 12/11.  [facility] on board.  S/p EGD 12/12 showed significant amount of blood in the stomach, lavaged and AVM bleeding in the stomach.  Unclear if due to trauma from NG causing this.  Treated endoscopically.  NG still draining dark liquid.  IV PPI fusion transition to PPI twice daily.  Eliquis on hold.  Hemoglobin down to 6.5 on 12/13, up to 7.9 after a PRBC transfusion.  Follow-up hemoglobin this afternoon is 8.3.   ESRD: on HD tues, thurs, sat Nephrology following.  Plan for off cycle HD on 12/14.  Hypokalemia Management per nephrology.   Acute Blood Loss Anemia  >> AVM GI Bleed per EGD Unsure cause - Prior EGD in 2019 showed nonbleeding gastritis, Colonoscopy showed small internal hemorrhoids, otherwise normal -Hemoglobin down to 6.5 on 12/13, up to 7.9 after PRBC transfusion.  Follow CBC closely and transfuse for hemoglobin 7 g or less.  Chronic Anemia: sickle cell and CKD.  Management as above.  Thrombocytopenia Unclear etiology but stable.  Trend CBC   HX COPD:  - takes incruse and ellipta at home.  No clinical bronchospasm. -Continue brovana, yupelri, and pulmicort -Pulm toilet as able -Goal O2 sat 88-96. (Currently on room air)  Chronic combined systolic and diastolic CHF:  - echo 5176 EF of 40 to 45% with mild right ventricular dysfunction and severe biatrial enlargement and severe mitral regurg and tricuspid regurg -Volume management across HD.  Does not appear overtly volume  overloaded.  Persistent Afib/atrial flutter with RVR:  - on eliquis at home, currently on hold due to postop ileus and upper GI bleed. - rate controlled  Pulm HTN -Heart Failure following -Holding home Hydralazine, Imdur, Torsemide given borderline hypotension    Sickle cell disease Plan: -Continue PCA   Protein calorie malnutrition Plan: -Continue TPN  HTN: -Controlled.    Body mass index is 21.19 kg/m.  Nutritional Status Nutrition Problem: Severe Malnutrition Etiology: acute illness (SBO) Signs/Symptoms: moderate fat depletion, moderate muscle depletion, percent weight loss (8% weight loss within 3 months) Percent weight loss: 8 % Interventions: TPN  Pressure Ulcer:     DVT prophylaxis: SCDs Start: 05/19/21 2116     Code Status: Full Code Family Communication: None at bedside. Disposition:  Status is: Inpatient  Remains inpatient appropriate because: Severity of illness including postop ileus, acute upper GI bleed, NG tube, TNA,        Consultants:   [facility] AHF team  Procedures:   As above.  Antimicrobials:   As above.  Subjective:  Seen this morning at HD.  Reports some abdominal discomfort.  Denies dyspnea or chest pain.  No BM or flatus.  Objective:   Vitals:   05/31/21 0719 05/31/21 0730 05/31/21 0800 05/31/21 0830  BP: 120/61 112/73 119/75 114/65  Pulse: 74 86 83 68  Resp:      Temp:      TempSrc:      SpO2:      Weight:      Height:        General exam: Young male, moderately built and nourished lying comfortably supine in bed, undergoing HD via right upper extremity AV fistula. Respiratory system: Clear to auscultation. Respiratory effort normal.  Right upper anterior chest Port-A-Cath. Cardiovascular system: S1 & S2 heard, irregularly irregular. No JVD, murmurs, rubs, gallops or clicks. No pedal edema. Gastrointestinal system: Abdomen is minimally distended but soft.  Nontender.  Surgical  dressings clean and dry and intact.  Occasional bowel sounds. Central nervous system: Alert and oriented. No focal neurological deficits. Extremities: Symmetric 5 x 5 power. Skin: No rashes, lesions or ulcers Psychiatry: Judgement and insight appear normal. Mood & affect appropriate.     Data Reviewed:   I have personally reviewed following labs and imaging studies   CBC: Recent Labs  Lab 05/28/21 2031 05/29/21 0340 05/30/21 1205 05/30/21 1627 05/31/21 0238  WBC 13.0*   < > 17.5* 15.1* 14.4*  NEUTROABS 10.3*  --   --   --  11.4*  HGB 6.6*   < > 8.4* 6.5* 7.9*  HCT 18.7*   < > 24.4* 19.6* 22.5*  MCV 90.8   < > 86.5 87.5 86.9  PLT 93*   < > 158 131* 132*   < > = values in this interval not displayed.    Basic Metabolic Panel: Recent Labs  Lab 05/26/21 0508 05/27/21 0751 05/28/21 2031 05/29/21 0340 05/30/21 0427 05/30/21 1205 05/30/21 1914 05/31/21 0238  NA 134*   < > 137 137 135 138  --  134*  K 3.3*   < > 3.6 3.6 2.7* 3.4*  --  3.4*  CL 96*   < > 104 103 99 100  --  100  CO2 24   < > 24 25 28 26   --  26  GLUCOSE 102*   < > 126* 111* 118* 111*  --  105*  BUN 47*   < > 60* 65* 24* 33*  --  47*  CREATININE 4.71*   < > 5.35* 5.72* 2.72* 3.18*  --  4.24*  CALCIUM 8.3*   < > 8.2* 8.3* 8.2* 8.7*  --  8.3*  MG 2.1  --  1.9 2.2 1.9  --   --  2.3  PHOS 4.1  --   --  3.1 1.5*  --  3.2 3.7   < > = values in this interval not displayed.    Liver Function Tests: Recent Labs  Lab 05/25/21 0609 05/28/21 2031 05/29/21 0340 05/30/21 0427 05/31/21 0238  AST 32 16 14* 18 19  ALT 16 5 6 9 9   ALKPHOS 110 88 79 99 110  BILITOT 1.5* 1.4* 1.4* 1.3* 2.1*  PROT 4.8* 4.7* 4.6* 5.1* 4.8*  ALBUMIN 1.9* 1.6* 1.5* 1.7* 1.6*    CBG: Recent Labs  Lab 05/30/21 1933 05/30/21 2312 05/31/21 0338  GLUCAP 107* 114* 111*    Microbiology Studies:   Recent Results (from the past 240 hour(s))  MRSA Next Gen by PCR, Nasal     Status: None   Collection Time: 05/24/21  5:54 PM    Specimen: Nasal Mucosa; Nasal Swab  Result Value Ref Range Status   MRSA by PCR Next Gen NOT DETECTED NOT DETECTED Final    Comment: (NOTE) The GeneXpert MRSA Assay (FDA approved for NASAL specimens only), is one component of a comprehensive MRSA colonization surveillance program. It is not intended to diagnose MRSA infection nor to guide or monitor treatment for MRSA infections. Test performance is not FDA approved in patients less than 39 years old. Performed at [facility], [place] [address] [place], [place] [address]     Radiology Studies:  No results found.  Scheduled Meds:    arformoterol  15 mcg Nebulization BID   budesonide (PULMICORT) nebulizer solution  0.25 mg Nebulization BID   Chlorhexidine Gluconate Cloth  6 each Topical Daily   darbepoetin (ARANESP) injection - DIALYSIS  200 mcg Intravenous Q Wed-HD   HYDROmorphone   Intravenous Q4H   mouth rinse  15 mL Mouth Rinse BID   pantoprazole  40 mg Intravenous Q12H   revefenacin  175 mcg Nebulization Daily   sodium chloride flush  10-40 mL Intracatheter Q12H    Continuous Infusions:    acetaminophen Stopped (05/31/21 0014)   methocarbamol (ROBAXIN) IV Stopped (05/30/21 2236)   TPN ADULT (ION) 65 mL/hr at 05/31/21 0400     LOS: 12 days     Vernell Leep, MD,  FACP, [facility], [facility], [facility] (Care Management Physician Certified) [place]  To contact the attending provider between 7A-7P or the covering provider during after hours 7P-7A, please log into the web site [email or web address] and access using universal [place] password for that web site. If you do not have the password, please call the hospital operator.  05/31/2021, 8:52 AM

## 2021-05-31 NOTE — Progress Notes (Signed)
Patients NG Tube clamped at this time per order. Will remain clamped for six hours per order.

## 2021-05-31 NOTE — Progress Notes (Addendum)
Daily Rounding Note  05/31/2021, 9:11 AM  LOS: 12 days   SUBJECTIVE:   Chief complaint:    GI bleed.  Anemia.   No BM's yest or today.  NGT still in place, output 1050 mL yesterday JP drain output 1.1 L yesterday  OBJECTIVE:         Vital signs in last 24 hours:    Temp:  [98.1 F (36.7 C)-98.9 F (37.2 C)] 98.9 F (37.2 C) (12/14 0658) Pulse Rate:  [68-107] 88 (12/14 0900) Resp:  [8-20] 15 (12/14 0658) BP: (106-137)/(59-80) 114/75 (12/14 0900) SpO2:  [92 %-100 %] 100 % (12/14 0658) FiO2 (%):  [0 %] 0 % (12/13 1600) Weight:  [68.9 kg] 68.9 kg (12/14 0337) Last BM Date: 05/22/21 Filed Weights   05/29/21 2217 05/30/21 0315 05/31/21 0337  Weight: 71 kg 69.5 kg 68.9 kg   General: looks comfortable, alert during HD session   Heart: RRR Chest: clear bil.  No dyspnea or cough  Abdomen: soft, ND, BS scant.  Tender over site of bandaged surgical incision.  Bandage is pristine.  Serosanguinous fluid in JP drain.  CGE in NGT canister/tubing.    Neuro/Psych:  pleasant, fluid speech.  No tremors.    Intake/Output from previous day: 12/13 0701 - 12/14 0700 In: 2656.6 [I.V.:1496.4; Blood:315; NG/GT:40; IV Piggyback:805.2] Out: 2585 [Urine:350; Emesis/NG output:1050; Drains:1185]  Intake/Output this shift: No intake/output data recorded.  Lab Results: Recent Labs    05/30/21 1205 05/30/21 1627 05/31/21 0238  WBC 17.5* 15.1* 14.4*  HGB 8.4* 6.5* 7.9*  HCT 24.4* 19.6* 22.5*  PLT 158 131* 132*   BMET Recent Labs    05/30/21 0427 05/30/21 1205 05/31/21 0238  NA 135 138 134*  K 2.7* 3.4* 3.4*  CL 99 100 100  CO2 28 26 26   GLUCOSE 118* 111* 105*  BUN 24* 33* 47*  CREATININE 2.72* 3.18* 4.24*  CALCIUM 8.2* 8.7* 8.3*   LFT Recent Labs    05/28/21 2031 05/29/21 0340 05/30/21 0427 05/31/21 0238  PROT 4.7* 4.6* 5.1* 4.8*  ALBUMIN 1.6* 1.5* 1.7* 1.6*  AST 16 14* 18 19  ALT 5 6 9 9   ALKPHOS 88 79 99 110   BILITOT 1.4* 1.4* 1.3* 2.1*  BILIDIR 0.5*  --   --   --   IBILI 0.9  --   --   --    PT/INR Recent Labs    05/29/21 0113 05/29/21 1327  LABPROT 15.9* 15.9*  INR 1.3* 1.3*   Hepatitis Panel No results for input(s): HEPBSAG, HCVAB, HEPAIGM, HEPBIGM in the last 72 hours.  Studies/Results: No results found.  Scheduled Meds:  arformoterol  15 mcg Nebulization BID   budesonide (PULMICORT) nebulizer solution  0.25 mg Nebulization BID   Chlorhexidine Gluconate Cloth  6 each Topical Daily   darbepoetin (ARANESP) injection - DIALYSIS  200 mcg Intravenous Q Wed-HD   HYDROmorphone   Intravenous Q4H   mouth rinse  15 mL Mouth Rinse BID   pantoprazole  40 mg Intravenous Q12H   revefenacin  175 mcg Nebulization Daily   sodium chloride flush  10-40 mL Intracatheter Q12H   Continuous Infusions:  methocarbamol (ROBAXIN) IV Stopped (05/30/21 2236)   TPN ADULT (ION) 65 mL/hr at 05/31/21 0400   PRN Meds:.albuterol, hydrALAZINE, ondansetron (ZOFRAN) IV, simethicone, sodium chloride flush   ASSESMENT:   Upper GI bleed.  Bloody NGT drainage.  05/29/2021 EGD: Grade C, nonbleeding ulcerative esophagitis.  Actively bleeding gastric  AVM treated with APC, epinephrine and Endo Clip.  Previously placed plastic stents (1 biliary, 1 pancreatic) present and removed.  New NG tube placed for ileus. Protonix 40 IV bid in place.  NPO.  TPN in place.  Blood loss anemia.  9 PRBCs thus far, last on 12/13.  Hgb 6.5 >>> 7.9 since yesterday afternoon.  Receiving aranesp.      SBO.  12/7 Ex lap w jejunal resection/primary anastomosis, open cholecystectomy.  Findings of gangrenous cholecystitis, chronic small bowel ischemia.     ESRD.     A. fib.  Chronic Eliquis on hold.  Previous ERCP, stone extraction and biliary/pancreatic duct stenting.   PLAN   Given volume of NG tube output, will leave NG tube in place.  Not ready to restart Eliquis at present.  Follow H&H.  Continue TPN.  IV Protonix.         Azucena Freed  05/31/2021, 9:11 AM Phone [phone]

## 2021-05-31 NOTE — Progress Notes (Signed)
Progress Note  2 Days Post-Op  Subjective: Had significant gas pains yesterday that are better today.  Fluid shift with mobilization caused JP drain to put a lot of fluid out yesterday.  No nausea with NGT clamped yesterday, but return to LIWS while having so much pain to see if this would help.  He has 1L output documented from NGT yesterday.  Hgb down to 6.5, requiring 1 unit pRBCs today  Objective: Vital signs in last 24 hours: Temp:  [98.1 F (36.7 C)-98.9 F (37.2 C)] 98.9 F (37.2 C) (12/14 0658) Pulse Rate:  [68-107] 68 (12/14 0830) Resp:  [8-20] 15 (12/14 0658) BP: (106-137)/(59-80) 114/65 (12/14 0830) SpO2:  [92 %-100 %] 100 % (12/14 0658) FiO2 (%):  [0 %] 0 % (12/13 1600) Weight:  [68.9 kg] 68.9 kg (12/14 0337) Last BM Date: 05/22/21  Intake/Output from previous day: 12/13 0701 - 12/14 0700 In: 2656.6 [I.V.:1496.4; Blood:315; NG/GT:40; IV Piggyback:805.2] Out: 2585 [Urine:350; Emesis/NG output:1050; Drains:1185] Intake/Output this shift: No intake/output data recorded.  PE: General: NAD Abd: soft, appropriately tender, NGT in place with some old bloody looking output in cannister but what's in the tubing looks more thin and less suspicious for some persistent oozing. midline incision is clean and packed, but adipose tissue is brown and not healthy appearing.  Fascial is intact.  JP drain with serosanguineous drainage. 50cc emptied during my visit.   Lab Results:  Recent Labs    05/30/21 1627 05/31/21 0238  WBC 15.1* 14.4*  HGB 6.5* 7.9*  HCT 19.6* 22.5*  PLT 131* 132*   BMET Recent Labs    05/30/21 1205 05/31/21 0238  NA 138 134*  K 3.4* 3.4*  CL 100 100  CO2 26 26  GLUCOSE 111* 105*  BUN 33* 47*  CREATININE 3.18* 4.24*  CALCIUM 8.7* 8.3*   PT/INR Recent Labs    05/29/21 0113 05/29/21 1327  LABPROT 15.9* 15.9*  INR 1.3* 1.3*   CMP     Component Value Date/Time   NA 134 (L) 05/31/2021 0238   NA 135 05/24/2020 1119   K 3.4 (L)  05/31/2021 0238   CL 100 05/31/2021 0238   CO2 26 05/31/2021 0238   GLUCOSE 105 (H) 05/31/2021 0238   BUN 47 (H) 05/31/2021 0238   BUN 25 05/24/2020 1119   CREATININE 4.24 (H) 05/31/2021 0238   CREATININE 2.90 (H) 08/11/2015 1359   CALCIUM 8.3 (L) 05/31/2021 0238   PROT 4.8 (L) 05/31/2021 0238   PROT 7.5 05/24/2020 1119   ALBUMIN 1.6 (L) 05/31/2021 0238   ALBUMIN 4.1 05/24/2020 1119   AST 19 05/31/2021 0238   ALT 9 05/31/2021 0238   ALKPHOS 110 05/31/2021 0238   BILITOT 2.1 (H) 05/31/2021 0238   BILITOT 2.4 (H) 05/24/2020 1119   GFRNONAA 15 (L) 05/31/2021 0238   GFRNONAA 23 (L) 08/11/2015 1359   GFRAA 24 (L) 05/24/2020 1119   GFRAA 27 (L) 08/11/2015 1359   Lipase     Component Value Date/Time   LIPASE 51 05/19/2021 1445       Studies/Results: No results found.  Anti-infectives: Anti-infectives (From admission, onward)    Start     Dose/Rate Route Frequency Ordered Stop   05/24/21 1730  piperacillin-tazobactam (ZOSYN) IVPB 2.25 g  Status:  Discontinued        2.25 g 100 mL/hr over 30 Minutes Intravenous Every 8 hours 05/24/21 1654 05/29/21 1303   05/24/21 1615  metroNIDAZOLE (FLAGYL) IVPB 500 mg  Note to Pharmacy: Please tube to #[phone]    500 mg 100 mL/hr over 60 Minutes Intravenous  Once 05/24/21 1603 05/24/21 1630        Assessment/Plan POD 7, s/p dx lap converted to ex lap for SBR and cholecystectomy for gangrenous cholecystitis and chronic ischemic segment of SB causing an obstruction, Dr. Donne Hazel 05/24/21 -[facility] -BID dressing changes -cont NGT for post op ileus, but will try clamping trials today with 6 hrs off and 2 hrs on and see how he does.  If he does not start having more function soon, given he is POD 7, may need to consider a CT scan within the next day or so. -plan to DC JP drain today, but in HD, will remove tomorrow. -mobilize/pulm toilet -TNA for nutritional support while has ileus -zosyn stopped 12/12 after 5 days post op  completed   FEN: NPO, NGT clamping trials, TPN VTE: SCDs, DVT prophylaxis on hold til hgb stabilizes ID: [email or web address], completed 12/12  UGI bleed - secondary to AVM in the gastric body, s/p tx by GI yesterday.  Hgb down to 6.5, ? Still equilibration vs small amount more oozing.  Vitals and patient are stable. ESRD - TTS, per renal CHF - per cards A. Fib on eliquis, on hold Anemia - hgb 6.5, getting 1 unit Acute hypoxic respiratory failure - extubated and doing well   LOS: 12 days    Patrick [name], [facility] Surgery 05/31/2021, 8:59 AM Please see Amion for pager number during day hours 7:00am-4:30pm

## 2021-06-01 ENCOUNTER — Encounter (HOSPITAL_COMMUNITY): Payer: Self-pay | Admitting: General Surgery

## 2021-06-01 LAB — COMPREHENSIVE METABOLIC PANEL
ALT: 13 U/L (ref 0–44)
AST: 23 U/L (ref 15–41)
Albumin: 1.8 g/dL — ABNORMAL LOW (ref 3.5–5.0)
Alkaline Phosphatase: 161 U/L — ABNORMAL HIGH (ref 38–126)
Anion gap: 11 (ref 5–15)
BUN: 43 mg/dL — ABNORMAL HIGH (ref 8–23)
CO2: 25 mmol/L (ref 22–32)
Calcium: 8.6 mg/dL — ABNORMAL LOW (ref 8.9–10.3)
Chloride: 101 mmol/L (ref 98–111)
Creatinine, Ser: 3.4 mg/dL — ABNORMAL HIGH (ref 0.61–1.24)
GFR, Estimated: 19 mL/min — ABNORMAL LOW (ref >60)
Glucose, Bld: 108 mg/dL — ABNORMAL HIGH (ref 70–99)
Potassium: 3.5 mmol/L (ref 3.5–5.1)
Sodium: 137 mmol/L (ref 135–145)
Total Bilirubin: 1.6 mg/dL — ABNORMAL HIGH (ref 0.3–1.2)
Total Protein: 5.1 g/dL — ABNORMAL LOW (ref 6.5–8.1)

## 2021-06-01 LAB — CBC
HCT: 22.8 % — ABNORMAL LOW (ref 39.0–52.0)
HCT: 24.2 % — ABNORMAL LOW (ref 39.0–52.0)
Hemoglobin: 7.7 g/dL — ABNORMAL LOW (ref 13.0–17.0)
Hemoglobin: 8 g/dL — ABNORMAL LOW (ref 13.0–17.0)
MCH: 30.1 pg (ref 26.0–34.0)
MCH: 32.7 pg (ref 26.0–34.0)
MCHC: 33.8 g/dL (ref 30.0–36.0)
MCHC: 33.1 g/dL (ref 30.0–36.0)
MCV: 89.1 fL (ref 80.0–100.0)
MCV: 98.8 fL (ref 80.0–100.0)
Platelets: 164 10*3/uL (ref 150–400)
Platelets: 171 10*3/uL (ref 150–400)
RBC: 2.56 MIL/uL — ABNORMAL LOW (ref 4.22–5.81)
RBC: 2.45 MIL/uL — ABNORMAL LOW (ref 4.22–5.81)
RDW: 19.8 % — ABNORMAL HIGH (ref 11.5–15.5)
RDW: 20.9 % — ABNORMAL HIGH (ref 11.5–15.5)
WBC: 13 10*3/uL — ABNORMAL HIGH (ref 4.0–10.5)
WBC: 11.1 10*3/uL — ABNORMAL HIGH (ref 4.0–10.5)
nRBC: 0.4 % — ABNORMAL HIGH (ref 0.0–0.2)
nRBC: 0.4 % — ABNORMAL HIGH (ref 0.0–0.2)

## 2021-06-01 LAB — PHOSPHORUS: Phosphorus: 2.6 mg/dL (ref 2.5–4.6)

## 2021-06-01 LAB — GLUCOSE, CAPILLARY
Glucose-Capillary: 110 mg/dL — ABNORMAL HIGH (ref 70–99)
Glucose-Capillary: 111 mg/dL — ABNORMAL HIGH (ref 70–99)
Glucose-Capillary: 111 mg/dL — ABNORMAL HIGH (ref 70–99)
Glucose-Capillary: 113 mg/dL — ABNORMAL HIGH (ref 70–99)
Glucose-Capillary: 116 mg/dL — ABNORMAL HIGH (ref 70–99)

## 2021-06-01 LAB — MAGNESIUM: Magnesium: 2 mg/dL (ref 1.7–2.4)

## 2021-06-01 MED ORDER — ONDANSETRON HCL 4 MG/2ML IJ SOLN: 4.0000 mg | INTRAMUSCULAR | Status: DC | PRN

## 2021-06-01 MED ORDER — HYDROMORPHONE HCL 1 MG/ML IJ SOLN
0.5000 mg | INTRAMUSCULAR | Status: DC | PRN
Start: 2021-06-01 — End: 2021-06-04
  Administered 2021-06-01 – 2021-06-04 (×15): 1 mg via INTRAVENOUS
  Filled 2021-06-01 (×15): qty 1

## 2021-06-01 MED ORDER — TRAMADOL HCL 50 MG PO TABS
50.0000 mg | ORAL_TABLET | Freq: Four times a day (QID) | ORAL | Status: DC | PRN
Start: 2021-06-01 — End: 2021-06-01

## 2021-06-01 MED ORDER — ACETAMINOPHEN 500 MG PO TABS
1000.0000 mg | ORAL_TABLET | Freq: Four times a day (QID) | ORAL | Status: DC
  Administered 2021-06-01 – 2021-06-09 (×29): 1000 mg via ORAL
  Filled 2021-06-01 (×32): qty 2

## 2021-06-01 MED ORDER — METHOCARBAMOL 500 MG PO TABS
1000.0000 mg | ORAL_TABLET | Freq: Three times a day (TID) | ORAL | Status: DC
  Administered 2021-06-01 – 2021-06-03 (×8): 1000 mg via ORAL
  Filled 2021-06-01 (×8): qty 2

## 2021-06-01 MED ORDER — TRAMADOL HCL 50 MG PO TABS
50.0000 mg | ORAL_TABLET | Freq: Two times a day (BID) | ORAL | Status: DC | PRN
Start: 2021-06-01 — End: 2021-06-01

## 2021-06-01 MED ORDER — POTASSIUM CHLORIDE 10 MEQ/50ML IV SOLN
10.0000 meq | INTRAVENOUS | Status: AC
  Administered 2021-06-01 (×3): 10 meq via INTRAVENOUS
  Filled 2021-06-01 (×3): qty 50

## 2021-06-01 MED ORDER — TRAMADOL HCL 50 MG PO TABS
50.0000 mg | ORAL_TABLET | Freq: Four times a day (QID) | ORAL | Status: DC | PRN
Start: 2021-06-01 — End: 2021-06-02
  Administered 2021-06-01: 100 mg via ORAL
  Filled 2021-06-01: qty 2

## 2021-06-01 MED ORDER — TRACE MINERALS CU-MN-SE-ZN 300-55-60-3000 MCG/ML IV SOLN
INTRAVENOUS | Status: AC
  Filled 2021-06-01: qty 676

## 2021-06-01 NOTE — Progress Notes (Addendum)
Advanced Heart Failure Rounding Note  PCP-Cardiologist: Dr. Haroldine Laws   Subjective:    Echo 12/06: EF 40-45%, RV mildly reduced, RVSP 55 mmHg, severe MR, severe TR S/p lap, cholecystectomy and SB resection 12/07.  Extubated 12/8  12/11 CCM Re-consulted due bleeding. 2 U PRBCs 12/12 Hg down to 6.6. Given 1UPRBCs. EGD with actively bleeding AVM in stomach treated endoscopically, nonbleeding ulcerative esophagitis 12/13 1 u PRBCs  Continues w/ NGT, 600 cc output yesterday. No BM yet. No flatus.   Hgb 7.7. Remains off heparin.    WBC trending down, 14>>13K. AF   RRR on exam. Issue w/ tele leads. RN to replace. Pulse rate 80s. BP stable.   Volume controlled through HD.   No cardiac complaints. Denies CP. No dyspnea.   Objective:   Weight Range: 66.9 kg Body mass index is 20.57 kg/m.   Vital Signs:   Temp:  [97.5 F (36.4 C)-99 F (37.2 C)] 98.7 F (37.1 C) (12/15 0445) Pulse Rate:  [78-96] 83 (12/15 0726) Resp:  [10-22] 12 (12/15 0945) BP: (109-125)/(57-75) 112/66 (12/15 0726) SpO2:  [94 %-100 %] 97 % (12/15 0945) FiO2 (%):  [0 %] 0 % (12/14 1615) Weight:  [66.3 kg-66.9 kg] 66.9 kg (12/15 0445) Last BM Date: 05/22/21  Weight change: Filed Weights   05/31/21 0337 05/31/21 1052 06/01/21 0445  Weight: 68.9 kg 66.3 kg 66.9 kg    Intake/Output:   Intake/Output Summary (Last 24 hours) at 06/01/2021 1027 Last data filed at 06/01/2021 0944 Gross per 24 hour  Intake 2028.63 ml  Output 3960 ml  Net -1931.37 ml      Physical Exam    General:  chronically ill appearing, blind. No respiratory difficulty HEENT: normal + NGT  Neck: supple. JVD 8-9 cm. Carotids 2+ bilat; no bruits. No lymphadenopathy or thyromegaly appreciated. Cor: PMI nondisplaced. Regular rate & rhythm. No rubs, gallops or murmurs. Lungs: clear Abdomen: mildly distended,+ surgical dressings dry. Hypoactive BS.  Extremities: no cyanosis, clubbing, rash, edema Neuro: alert & oriented x 3,  cranial nerves grossly intact. moves all 4 extremities w/o difficulty. Affect pleasant.   Telemetry   Current issues w/ tele leads, no tracing/waveforms. RN to replace. RRR on exam   Labs    CBC Recent Labs    05/31/21 0238 05/31/21 1518 06/01/21 0517  WBC 14.4* 14.1* 13.0*  NEUTROABS 11.4*  --   --   HGB 7.9* 8.3* 7.7*  HCT 22.5* 24.7* 22.8*  MCV 86.9 88.5 89.1  PLT 132* 143* 505   Basic Metabolic Panel Recent Labs    05/31/21 0238 06/01/21 0517  NA 134* 137  K 3.4* 3.5  CL 100 101  CO2 26 25  GLUCOSE 105* 108*  BUN 47* 43*  CREATININE 4.24* 3.40*  CALCIUM 8.3* 8.6*  MG 2.3 2.0  PHOS 3.7 2.6   Liver Function Tests Recent Labs    05/31/21 0238 06/01/21 0517  AST 19 23  ALT 9 13  ALKPHOS 110 161*  BILITOT 2.1* 1.6*  PROT 4.8* 5.1*  ALBUMIN 1.6* 1.8*   No results for input(s): LIPASE, AMYLASE in the last 72 hours. Cardiac Enzymes No results for input(s): CKTOTAL, CKMB, CKMBINDEX, TROPONINI in the last 72 hours.  BNP: BNP (last 3 results) Recent Labs    08/01/20 2047  BNP 2,730.5*    ProBNP (last 3 results) No results for input(s): PROBNP in the last 8760 hours.   D-Dimer Recent Labs    05/29/21 1327  DDIMER >20.00*  Hemoglobin A1C No results for input(s): HGBA1C in the last 72 hours. Fasting Lipid Panel No results for input(s): CHOL, HDL, LDLCALC, TRIG, CHOLHDL, LDLDIRECT in the last 72 hours.   Thyroid Function Tests No results for input(s): TSH, T4TOTAL, T3FREE, THYROIDAB in the last 72 hours.  Invalid input(s): FREET3  Other results:   Imaging    No results found.   Medications:     Scheduled Medications:  acetaminophen  1,000 mg Oral Q6H   arformoterol  15 mcg Nebulization BID   budesonide (PULMICORT) nebulizer solution  0.25 mg Nebulization BID   Chlorhexidine Gluconate Cloth  6 each Topical Daily   darbepoetin (ARANESP) injection - DIALYSIS  200 mcg Intravenous Q Wed-HD   HYDROmorphone   Intravenous Q4H   mouth  rinse  15 mL Mouth Rinse BID   methocarbamol  1,000 mg Oral TID   pantoprazole  40 mg Intravenous Q12H   revefenacin  175 mcg Nebulization Daily   sodium chloride flush  10-40 mL Intracatheter Q12H    Infusions:  TPN ADULT (ION) 65 mL/hr at 05/31/21 1740   TPN ADULT (ION)      PRN Medications: albuterol, hydrALAZINE, HYDROmorphone (DILAUDID) injection, polyvinyl alcohol, simethicone, sodium chloride flush    Patient Profile   65 y/o chronically ill male with sickle cell dz, ESRD, chronic systolic HF EF 08%, pulmonary HTN, chronic AF and legal blindness. Admitted with recurrent SBO and a/c GI bleeding. We are asked to help manage AF with RVR and assess peri-operative risk.   Assessment/Plan    1. Persistent Atrial Fibrillation/AFL - S/p DCCV 05/2018 - S/P DCCV 10/06/19 NSR - Recurrence 05/21. Has been seen in AF Clinic and rate control strategy recommended - Now off IV amio. Rate is controlled  - Holding anticoagulation due to recurrent bleeding and surgery    2. Chronic combined Systolic/Diastolic Heart Failure:  - NICM. Cath 05/2011 without significant coronary disease.   - ECHO 12/2015 40%.  - Echo 05/2018: EF 25-30%.  - Echo 3/21 LVEF 35-40%, low normal RV function,severe pulm HTN. Severe LAE, mod RAE, mod MR, severe TR. RV TAPSE 1.6, RV s' 10 - Echo 9/21 EF 40-45%, RV mildly reduced, mild RAE, severe pulm artery systolic pressure, severe TR - Echo this admit suggestive of restrictive CM with EF 40-45%, RV mildly decreased, severe MR, severe TR, RVSP 55 mmHg - Volume managed with HD on T,T,S - IV hydralazine PRN  - Off Entresto with ESRD   3. Anemia with H/o GI bleeding: - Prior EGD in 2019 showed nonbleeding gastritis, Colonoscopy showed small internal hemorrhoids, otherwise normal.  - GI bleed 02/22 - gastric erosions on EGD - A/C anemia 11/22 - transfused with 3 uPRBCs - 12/11 started bleeding again. Hemoptysis. Given 2 U PRBcs - 12/11 1 U PRBCs. EGD with bleeding  AVM in stomach treated with APC and 3 clips, nonbleeding ulcerative gastritis - A/C anemia, received 9 u PRBcs so far this admit (1 u 12/12 and 1 u 12/13), 2 u FFP - Hgb 7.7 today. Transfuse if drops < 7 - Receiving aranesp - On IV protonix - Holding anticoagulation. Need to weigh risks/benefits of resuming anticoagulation with hx recurrent bleeding.    4. SBO/gangrenous cholecystitis - s/p lap, cholecystectomy and SB resection 12/07 - Post op ileus, still has NGT. Attempting clamping trials today. - GI/surgery considering CT scan if no sign of improvement in next days - Has open abdominal wound - Now off abx   6. Pulmonary HTN  -  Last echo 09/21 with EF 40-45%, RV mildly reduced, RVSP 75 mmHg, severe TR - Echo this admit with EF 40-45%, RV mildly reduced, severe MR, severe TR, RVSP 55 mmHg   7. Abdominal ascites -Noted on CT -Mild hepatic nodularity present   8. HTN: - BP stable - PRN IV hydralazine    9.  Sickle Cell:   - Follows closely with [facility].    66. Former smoker:   - Quit 11/2015.   11. ESRD - On HD now T/T/S - Nephrology following  12. Hypokalemia - K 3.5  - Supp K RNP    Length of Stay: [address] PA-C  06/01/2021, 10:27 AM  Advanced Heart Failure Team Pager [phone] (M-F; 7a - 5p)  Please contact [facility] for night-coverage after hours (5p -7a ) and weekends on [email or web address]   Patient seen and examined with the above-signed Advanced Practice Provider and/or Housestaff. I personally reviewed laboratory data, imaging studies and relevant notes. I independently examined the patient and formulated the important aspects of the plan. I have edited the note to reflect any of my changes or salient points. I have personally discussed the plan with the patient and/or family.  Still with significant NGT output and no flatus. AF remains rate controlled.   General:  Sitting up  HEENT: normal + NGT Neck: supple. JVP 7 Carotids 2+  bilat; no bruits. No lymphadenopathy or thryomegaly appreciated. Cor: PMI nondisplaced. Irregular rate & rhythm. No rubs, gallops or murmurs. Lungs: clear Abdomen: soft, + distended. No hepatosplenomegaly. No bruits or masses. Hypoactive bowel sounds. + dressing Extremities: no cyanosis, clubbing, rash, edema Neuro: alert & orientedx3, cranial nerves grossly intact. moves all 4 extremities w/o difficulty. Affect pleasant  Continues with persistent ileus. Hemodynamically stable. AF rate control. Continue to hold anti-coagulation.   Glori Bickers, MD  4:46 PM

## 2021-06-01 NOTE — Progress Notes (Signed)
NG Tube clamped per order. Will remain clamped for six hours per order.

## 2021-06-01 NOTE — Progress Notes (Signed)
PT Cancellation Note  Patient Details Name: Patrick [name] MRN: [id number] DOB: [birth year]   Cancelled Treatment:    Reason Eval/Treat Not Completed: Pt reports abdominal pain limiting ability to participate. Provided max encouragement and education regarding importance of mobility; referenced the fact that pt stated goal with surgery PA this AM was to get out of bed, but pt still declining. Pt reports nothing we can say to convince him to move. Will follow-up for PT treatment as schedule permits.  Mabeline Caras, PT, DPT Acute Rehabilitation Services  Pager [phone] Office [place] 06/01/2021, 2:20 PM

## 2021-06-01 NOTE — Progress Notes (Signed)
PROGRESS NOTE   Patrick [name]  SEG:[id number]    DOB: 01-Mar-1956    DOA: 05/19/2021  PCP: Dorena Dew, FNP   I have briefly reviewed patients previous medical records in [facility].  Chief Complaint  Patient presents with   Small Bowel Obstruction    Brief Narrative:  65 year old male with medical history significant for ESRD on TTS HD, chronic systolic CHF, COPD, pulmonary hypertension, sickle cell anemia, chronic A. fib on Eliquis, SBO, choledocholithiasis, presented to [facility] on 05/19/2021 with recurrent SBO and choledocholithiasis.  On 05/09/2021, patient was hospitalized at [place] with SBO and choledocholithiasis.  SBO resolved with conservative measures and choledocholithiasis improved with ERCP and stent placement.    12/7: Underwent exploratory laparotomy, surgical resection of SBO and cholecystectomy.  Postprocedure, patient remained on vasopressors and intubated.  Extubated 12/8 and transferred to [facility] on 12/9.  12/11, noted persistent bloody drainage from NGT, general surgery and [facility] on board, s/p DDAVP and Protonix infusion.  12/12: EGD showed significant amount of blood in the stomach, lavaged and AVM bleeding, treated endoscopically.  Care transferred back from [facility] to [facility] on 12/14.   Assessment & Plan:  Principal Problem:   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 05/24/2021 Active Problems:   Abdominal pain   Nausea & vomiting   Anemia   End stage renal disease (HCC)   Permanent atrial fibrillation (HCC)   Chronic systolic CHF (congestive heart failure) (HCC)   Upper GI bleed   SBO (small bowel obstruction) (HCC)   Protein-calorie malnutrition, severe   ABLA (acute blood loss anemia)   Empyema of gallbladder   Acute gangrenous cholecystitis s/p open cholcystectomy 05/24/2021   SBO due to chronic ischemic segment of SB, postop ileus: 12/7: S/p diagnostic laparoscopy converted to exploratory laparotomies for small bowel  resection and cholecystectomy for gangrenous cholecystitis and chronic ischemic segment of SB causing an obstruction.  General surgery continues to follow.  Twice daily dressing changes.  Prolonged postop ileus.  TNA for nutritional support.  Completed 5 days of Zosyn 12/12.  Discussed with [facility]CS team.  Tolerated prolonged periods of NGT clamping yesterday.  Still without flatus or BM.  Mobilized some.  Discontinued JP drain-now with a open hole with significant amount of?  Ascitic fluid leakage/squatting.  Discontinuing PCA and adjusting multimodality pain control.  Continuing NG tube clamping with trial of sips of clears.  If does not improve or if has increased NG tube output or abdominal pain/distention, then will consider repeating CT abdomen.  Gangrenous cholecystitis: 12/7 cholecystectomy; recent Choledocholithiasis s/p recent ERCP stent placement at [facility]  Acute upper GI bleed/NGT bleeding S/p DDAVP 12/11.  [facility] on board.  S/p EGD 12/12 showed significant amount of blood in the stomach, lavaged and AVM bleeding in the stomach.  Unclear if due to trauma from NG causing this.  Treated endoscopically.  NG still draining dark liquid.  IV PPI fusion transition to PPI twice daily.  Eliquis on hold.  Hemoglobin down to 6.5 on 12/13, up to 7.9 after a PRBC transfusion.  Hemoglobin continues to fluctuate but has not required transfusion in the last 24 hours.  Hemoglobin to 7.7.  We will recheck this afternoon and again in a.m.  No active bleeding clinically.   ESRD: on HD tues, thurs, sat Nephrology following.  S/p HD on 12/14.  Hypokalemia Replaced.   Acute Blood Loss Anemia  >> AVM GI Bleed per EGD Unsure cause - Prior EGD  in 2019 showed nonbleeding gastritis, Colonoscopy showed small internal hemorrhoids, otherwise normal -Hemoglobin down to 6.5 on 12/13, up to 7.9 after PRBC transfusion.  Follow CBC closely and transfuse for hemoglobin 7 g or less. - Hemoglobin continues to fluctuate  but has not required transfusion in the last 24 hours.  Hemoglobin to 7.7.  We will recheck this afternoon and again in a.m.  No active bleeding clinically.  Chronic Anemia: sickle cell and CKD.  Management as above.  Thrombocytopenia Resolved.   HX COPD:  - takes incruse and ellipta at home.  No clinical bronchospasm. -Continue brovana, yupelri, and pulmicort -Pulm toilet as able -Goal O2 sat 88-96. (Currently on room air)    Chronic combined systolic and diastolic CHF:  - echo 8786 EF of 40 to 45% with mild right ventricular dysfunction and severe biatrial enlargement and severe mitral regurg and tricuspid regurg -Volume management across HD.  Does not appear overtly volume overloaded.  Persistent Afib/atrial flutter with RVR:  - on eliquis at home, currently on hold due to postop ileus and upper GI bleed. - rate controlled  Pulm HTN -Heart Failure following -Holding home Hydralazine, Imdur, Torsemide given borderline hypotension    Sickle cell disease Plan: -Discontinuing PCA and adjusting multimodality pain control   Protein calorie malnutrition Plan: -Continue TPN  HTN: -Controlled.    Body mass index is 20.57 kg/m.  Nutritional Status Nutrition Problem: Severe Malnutrition Etiology: acute illness (SBO) Signs/Symptoms: moderate fat depletion, moderate muscle depletion, percent weight loss (8% weight loss within 3 months) Percent weight loss: 8 % Interventions: TPN  Pressure Ulcer:     DVT prophylaxis: SCDs Start: 05/19/21 2116     Code Status: Full Code Family Communication: None at bedside. Disposition:  Status is: Inpatient  Remains inpatient appropriate because: Severity of illness including postop ileus, acute upper GI bleed, NG tube, TNA,        Consultants:   [facility] AHF team  Procedures:   As above.  Antimicrobials:   As above.  Subjective:  Still without BM or flatus.  Not much abdominal  pain.  States that he was up in chair yesterday.  Objective:   Vitals:   06/01/21 0445 06/01/21 0456 06/01/21 0726 06/01/21 0828  BP: 120/72  112/66   Pulse: 95  83   Resp: 17 14 12    Temp: 98.7 F (37.1 C)     TempSrc: Oral     SpO2: 100% 100% 100% 100%  Weight: 66.9 kg     Height:        General exam: Young male, moderately built and nourished lying comfortably supine in bed, no distress noted. Respiratory system: Clear to auscultation.  No increased work of breathing.  Right upper anterior chest Port-A-Cath. Cardiovascular system: S1 & S2 heard, irregularly irregular. No JVD, murmurs, rubs, gallops or clicks. No pedal edema. Gastrointestinal system: Abdomen is mildly distended, soft and nontender.  Surgical site dressings clean and dry.  Left mid quadrant JP drain has been removed and small hole noted which is not currently draining.  Occasional bowel sounds heard. Central nervous system: Alert and oriented. No focal neurological deficits. Extremities: Symmetric 5 x 5 power. Skin: No rashes, lesions or ulcers Psychiatry: Judgement and insight appear normal. Mood & affect appropriate.     Data Reviewed:   I have personally reviewed following labs and imaging studies   CBC: Recent Labs  Lab 05/28/21 2031 05/29/21 0340 05/31/21 0238 05/31/21 1518 06/01/21 0517  WBC 13.0*   < > 14.4* 14.1* 13.0*  NEUTROABS 10.3*  --  11.4*  --   --   HGB 6.6*   < > 7.9* 8.3* 7.7*  HCT 18.7*   < > 22.5* 24.7* 22.8*  MCV 90.8   < > 86.9 88.5 89.1  PLT 93*   < > 132* 143* 164   < > = values in this interval not displayed.    Basic Metabolic Panel: Recent Labs  Lab 05/28/21 2031 05/29/21 0340 05/30/21 0427 05/30/21 1205 05/30/21 1914 05/31/21 0238 06/01/21 0517  NA 137 137 135 138  --  134* 137  K 3.6 3.6 2.7* 3.4*  --  3.4* 3.5  CL 104 103 99 100  --  100 101  CO2 24 25 28 26   --  26 25  GLUCOSE 126* 111* 118* 111*  --  105* 108*  BUN 60* 65* 24* 33*  --  47* 43*   CREATININE 5.35* 5.72* 2.72* 3.18*  --  4.24* 3.40*  CALCIUM 8.2* 8.3* 8.2* 8.7*  --  8.3* 8.6*  MG 1.9 2.2 1.9  --   --  2.3 2.0  PHOS  --  3.1 1.5*  --  3.2 3.7 2.6    Liver Function Tests: Recent Labs  Lab 05/28/21 2031 05/29/21 0340 05/30/21 0427 05/31/21 0238 06/01/21 0517  AST 16 14* 18 19 23   ALT 5 6 9 9 13   ALKPHOS 88 79 99 110 161*  BILITOT 1.4* 1.4* 1.3* 2.1* 1.6*  PROT 4.7* 4.6* 5.1* 4.8* 5.1*  ALBUMIN 1.6* 1.5* 1.7* 1.6* 1.8*    CBG: Recent Labs  Lab 05/31/21 1940 05/31/21 2320 06/01/21 0806  GLUCAP 110* 109* 116*    Microbiology Studies:   Recent Results (from the past 240 hour(s))  MRSA Next Gen by PCR, Nasal     Status: None   Collection Time: 05/24/21  5:54 PM   Specimen: Nasal Mucosa; Nasal Swab  Result Value Ref Range Status   MRSA by PCR Next Gen NOT DETECTED NOT DETECTED Final    Comment: (NOTE) The GeneXpert MRSA Assay (FDA approved for NASAL specimens only), is one component of a comprehensive MRSA colonization surveillance program. It is not intended to diagnose MRSA infection nor to guide or monitor treatment for MRSA infections. Test performance is not FDA approved in patients less than 105 years old. Performed at [facility], [place] [address], [place], [place] [address]     Radiology Studies:  No results found.  Scheduled Meds:    acetaminophen  1,000 mg Oral Q6H   arformoterol  15 mcg Nebulization BID   budesonide (PULMICORT) nebulizer solution  0.25 mg Nebulization BID   Chlorhexidine Gluconate Cloth  6 each Topical Daily   darbepoetin (ARANESP) injection - DIALYSIS  200 mcg Intravenous Q Wed-HD   HYDROmorphone   Intravenous Q4H   mouth rinse  15 mL Mouth Rinse BID   methocarbamol  1,000 mg Oral TID   pantoprazole  40 mg Intravenous Q12H   revefenacin  175 mcg Nebulization Daily   sodium chloride flush  10-40 mL Intracatheter Q12H    Continuous Infusions:    TPN ADULT (ION) 65 mL/hr at 05/31/21 1740   TPN ADULT  (ION)       LOS: 13 days     Vernell Leep, MD,  FACP, [facility], [facility], [facility] (Care Management Physician Certified) [place]  To contact the attending provider between 7A-7P or the  covering provider during after hours 7P-7A, please log into the web site [email or web address] and access using universal [place] password for that web site. If you do not have the password, please call the hospital operator.  06/01/2021, 8:53 AM

## 2021-06-01 NOTE — Progress Notes (Signed)
Subjective: I/O -1.4 L yesterday w/ NG and drains output + UF on HD. Hb up 7.9 today. Other labs ok. Bp's stable, afib per tele. On Room air.   Objective Vital signs in last 24 hours: Vitals:   06/01/21 0828 06/01/21 0945 06/01/21 1116 06/01/21 1200  BP:   115/67   Pulse:   95   Resp:  12 17 17   Temp:   98.7 F (37.1 C)   TempSrc:   Oral   SpO2: 100% 97% 94% 95%  Weight:      Height:       Weight change: -2.6 kg  Physical Exam: General: thin chronically ill male, on room air, NG in place Heart: RRR no MRG noted Lungs: coarse BS b/l Abdomen: incisions bandaged Extremities: No pedal edema  dialysis Access: RUA aVF+ bruit  OP HD: TTS AF   4h  400/500   65kg  2/2 bath  P2  AVF  Hep none - Sensipar 30mg  PO q HD - Venofer 50mg  IV weekly - Mircera 246mcg IV q 2 weeks (last 11/8)   Assessment/ Plan SBO/ recent gallstone with CBD &pancreatic duct stenting: s/p ex lap here 12/07 w/ cholecystectomy and SB resection (gangrenous GB and chronic SB ischemia). No bowel function at this time, remains on TPN. Per gen surgery.  UGIB - d/t AVM in stomach, rx'd by GI w/ APC/ endoclips/ epi sq on 12/12  ESRD: usual HD TTS. Has been off schedule this week x 2. Hold next HD until Saturday to get back on schedule.  HTN/volume: BP stable, only up 2kg. No edema by exam. UF goal next HD about 3-3.5L.  Anemia CKD/ sickle cell/ abl- Aranesp with HD today.  Check Fe values. Hb 7.9 today, sp 9u prbc's total here.  Hypokalemia - getting IV KCl runs daily, ordered another 3 for today  Metabolic bone disease: Ca/Phos ok - resume binders when eating  Nutrition: on TPN,+ NGT, will order supplements when cleared to eat.  A-fib: per cardiology   Kelly Splinter, MD 05/31/2021, 2:50 PM      Labs: Basic Metabolic Panel: Recent Labs  Lab 05/30/21 1205 05/30/21 1914 05/31/21 0238 06/01/21 0517  NA 138  --  134* 137  K 3.4*  --  3.4* 3.5  CL 100  --  100 101  CO2 26  --  26 25  GLUCOSE 111*  --  105*  108*  BUN 33*  --  47* 43*  CREATININE 3.18*  --  4.24* 3.40*  CALCIUM 8.7*  --  8.3* 8.6*  PHOS  --  3.2 3.7 2.6    Liver Function Tests: Recent Labs  Lab 05/30/21 0427 05/31/21 0238 06/01/21 0517  AST 18 19 23   ALT 9 9 13   ALKPHOS 99 110 161*  BILITOT 1.3* 2.1* 1.6*  PROT 5.1* 4.8* 5.1*  ALBUMIN 1.7* 1.6* 1.8*    No results for input(s): LIPASE, AMYLASE in the last 168 hours.  No results for input(s): AMMONIA in the last 168 hours. CBC: Recent Labs  Lab 05/28/21 2031 05/29/21 0340 05/30/21 1205 05/30/21 1627 05/31/21 0238 05/31/21 1518 06/01/21 0517  WBC 13.0*   < > 17.5* 15.1* 14.4* 14.1* 13.0*  NEUTROABS 10.3*  --   --   --  11.4*  --   --   HGB 6.6*   < > 8.4* 6.5* 7.9* 8.3* 7.7*  HCT 18.7*   < > 24.4* 19.6* 22.5* 24.7* 22.8*  MCV 90.8   < > 86.5 87.5  86.9 88.5 89.1  PLT 93*   < > 158 131* 132* 143* 164   < > = values in this interval not displayed.    Cardiac Enzymes: No results for input(s): CKTOTAL, CKMB, CKMBINDEX, TROPONINI in the last 168 hours. CBG: Recent Labs  Lab 05/31/21 1619 05/31/21 1940 05/31/21 2320 06/01/21 0806 06/01/21 1132  GLUCAP 108* 110* 109* 116* 110*      Medications:  TPN ADULT (ION) 65 mL/hr at 05/31/21 1740   TPN ADULT (ION)      acetaminophen  1,000 mg Oral Q6H   arformoterol  15 mcg Nebulization BID   budesonide (PULMICORT) nebulizer solution  0.25 mg Nebulization BID   Chlorhexidine Gluconate Cloth  6 each Topical Daily   darbepoetin (ARANESP) injection - DIALYSIS  200 mcg Intravenous Q Wed-HD   mouth rinse  15 mL Mouth Rinse BID   methocarbamol  1,000 mg Oral TID   pantoprazole  40 mg Intravenous Q12H   revefenacin  175 mcg Nebulization Daily   sodium chloride flush  10-40 mL Intracatheter Q12H

## 2021-06-01 NOTE — Progress Notes (Signed)
° ° ° °   Progress Note   Subjective  Patient states he is feeling okay today. No vomiting. Not much out from NG overnight per nursing. NG clamped this AM. He is not passing any gas. Otherwise stable.   Objective   Vital signs in last 24 hours: Temp:  [97.5 F (36.4 C)-99 F (37.2 C)] 98.7 F (37.1 C) (12/15 0445) Pulse Rate:  [78-96] 83 (12/15 0726) Resp:  [10-22] 12 (12/15 0726) BP: (107-128)/(57-75) 112/66 (12/15 0726) SpO2:  [94 %-100 %] 100 % (12/15 0828) FiO2 (%):  [0 %] 0 % (12/14 1615) Weight:  [66.3 kg-66.9 kg] 66.9 kg (12/15 0445) Last BM Date: 05/22/21 General:    AA male in NAD, NG in place Abdomen:  Soft, mild tenderness, postop wounds intact.  Neurologic:  Alert and oriented,  grossly normal neurologically. Psych:  Cooperative. Normal mood and affect.  Intake/Output from previous day: 12/14 0701 - 12/15 0700 In: 2022.6 [I.V.:1628.6; NG/GT:60; IV Piggyback:334] Out: 3860 [Urine:125; Emesis/NG output:600; Drains:135] Intake/Output this shift: No intake/output data recorded.  Lab Results: Recent Labs    05/31/21 0238 05/31/21 1518 06/01/21 0517  WBC 14.4* 14.1* 13.0*  HGB 7.9* 8.3* 7.7*  HCT 22.5* 24.7* 22.8*  PLT 132* 143* 164   BMET Recent Labs    05/30/21 1205 05/31/21 0238 06/01/21 0517  NA 138 134* 137  K 3.4* 3.4* 3.5  CL 100 100 101  CO2 26 26 25   GLUCOSE 111* 105* 108*  BUN 33* 47* 43*  CREATININE 3.18* 4.24* 3.40*  CALCIUM 8.7* 8.3* 8.6*   LFT Recent Labs    06/01/21 0517  PROT 5.1*  ALBUMIN 1.8*  AST 23  ALT 13  ALKPHOS 161*  BILITOT 1.6*   PT/INR Recent Labs    05/29/21 1327  LABPROT 15.9*  INR 1.3*    Studies/Results: No results found.     Assessment / Plan:    65 y/o male with history of ERCP for choledocholithiasis, bowel obstruction s/p resection of jejunum, cholecystectomy, course complicated by upper GI bleeding earlier this week. EGD per Dr. Ardis Hughs Monday showed a significant amount of blood in the stomach,  lavaged and AVM bleeding in the stomach. Treated endoscopically. He has not had any recurrent hematemesis or active bleeding since then, Hgb has fluctuated, old blood out of NG (with ileus he did not clear any blood per rectum). His ileus has not resolved unfortunately, on TPN. Eliquis on hold for now, surgery to give trial of clamping tube with clears today.  Recommend: -  continue IV protonix for now while intolerant of PO, once tolerating PO can switch to protonix 40mg  PO BID - would continue to hold Eliquis for now over the next few days. If no rebleeding can consider resuming pending his course - await course with NG being clamped and clears per surgery  If concern for recurrent active bleeding please contact us, otherwise will sign off for now.   Jolly Mango, MD [facility]

## 2021-06-01 NOTE — Progress Notes (Signed)
Patients NG Tube unclamped and placed on Low intermittent suction. Will remain unclamped on suction for two hours per order.

## 2021-06-01 NOTE — Progress Notes (Signed)
Progress Note  3 Days Post-Op  Subjective: Feels pretty good today.  No issues with nausea at all with NGT clamping yesterday.  Still no flatus.  Wants to get up and mobilize and sit in chair today  Objective: Vital signs in last 24 hours: Temp:  [97.5 F (36.4 C)-99 F (37.2 C)] 98.7 F (37.1 C) (12/15 0445) Pulse Rate:  [68-96] 83 (12/15 0726) Resp:  [10-22] 12 (12/15 0726) BP: (107-128)/(57-75) 112/66 (12/15 0726) SpO2:  [94 %-100 %] 100 % (12/15 0726) FiO2 (%):  [0 %] 0 % (12/14 1615) Weight:  [66.3 kg-66.9 kg] 66.9 kg (12/15 0445) Last BM Date: 05/22/21  Intake/Output from previous day: 12/14 0701 - 12/15 0700 In: 2022.6 [I.V.:1628.6; NG/GT:60; IV Piggyback:334] Out: 3860 [Urine:125; Emesis/NG output:600; Drains:135] Intake/Output this shift: No intake/output data recorded.  PE: General: NAD Abd: soft, appropriately tender, NGT in place and residual checked with just some clear water from recent ice chips, only minimal noted on cannister from clamping trials yesterday.  Midline wound is intact, but adipose tissue yellow and brown, one lap site staple removed.  JP drain removed with copious ascitic serosang fluid draining from wound once drain removed.   Lab Results:  Recent Labs    05/31/21 1518 06/01/21 0517  WBC 14.1* 13.0*  HGB 8.3* 7.7*  HCT 24.7* 22.8*  PLT 143* 164   BMET Recent Labs    05/31/21 0238 06/01/21 0517  NA 134* 137  K 3.4* 3.5  CL 100 101  CO2 26 25  GLUCOSE 105* 108*  BUN 47* 43*  CREATININE 4.24* 3.40*  CALCIUM 8.3* 8.6*   PT/INR Recent Labs    05/29/21 1327  LABPROT 15.9*  INR 1.3*   CMP     Component Value Date/Time   NA 137 06/01/2021 0517   NA 135 05/24/2020 1119   K 3.5 06/01/2021 0517   CL 101 06/01/2021 0517   CO2 25 06/01/2021 0517   GLUCOSE 108 (H) 06/01/2021 0517   BUN 43 (H) 06/01/2021 0517   BUN 25 05/24/2020 1119   CREATININE 3.40 (H) 06/01/2021 0517   CREATININE 2.90 (H) 08/11/2015 1359   CALCIUM  8.6 (L) 06/01/2021 0517   PROT 5.1 (L) 06/01/2021 0517   PROT 7.5 05/24/2020 1119   ALBUMIN 1.8 (L) 06/01/2021 0517   ALBUMIN 4.1 05/24/2020 1119   AST 23 06/01/2021 0517   ALT 13 06/01/2021 0517   ALKPHOS 161 (H) 06/01/2021 0517   BILITOT 1.6 (H) 06/01/2021 0517   BILITOT 2.4 (H) 05/24/2020 1119   GFRNONAA 19 (L) 06/01/2021 0517   GFRNONAA 23 (L) 08/11/2015 1359   GFRAA 24 (L) 05/24/2020 1119   GFRAA 27 (L) 08/11/2015 1359   Lipase     Component Value Date/Time   LIPASE 51 05/19/2021 1445       Studies/Results: No results found.  Anti-infectives: Anti-infectives (From admission, onward)    Start     Dose/Rate Route Frequency Ordered Stop   05/24/21 1730  piperacillin-tazobactam (ZOSYN) IVPB 2.25 g  Status:  Discontinued        2.25 g 100 mL/hr over 30 Minutes Intravenous Every 8 hours 05/24/21 1654 05/29/21 1303   05/24/21 1615  metroNIDAZOLE (FLAGYL) IVPB 500 mg       Note to Pharmacy: Please tube to #[phone]    500 mg 100 mL/hr over 60 Minutes Intravenous  Once 05/24/21 1603 05/24/21 1630        Assessment/Plan POD 8, s/p dx lap converted to ex  lap for SBR and cholecystectomy for gangrenous cholecystitis and chronic ischemic segment of SB causing an obstruction, Dr. Donne Hazel 05/24/21 -[facility] -BID dressing changes -prn dressing changes over JP drain site til this closes and ascitic leakage stops.  He is having significant leakage now which isn't unexpected sometimes.  This is normal serosang fluid and not of concern such as bile or blood.   -clamp NGT and give sips of liquids from the floor -mobilize/pulm toilet -TNA for nutritional support while has ileus -zosyn stopped 12/12 after 5 days post op completed -DC PCA today and add tramadol for oral pain control, robaxin, tylenol, and prn dilaudid for severe or breakthrough pain.   FEN: NPO, NGT clamped, sips of clears from floor TPN VTE: SCDs, DVT prophylaxis on hold til hgb stabilizes ID: [email or web address], completed  12/12  UGI bleed - secondary to AVM in the gastric body, s/p tx by GI yesterday.  Hgb 7.7 today. ESRD - TTS, per renal CHF - per cards A. Fib on eliquis, on hold Anemia - hgb 7.7 Acute hypoxic respiratory failure - extubated and doing well   LOS: 13 days    Henreitta Cea, [facility] 06/01/2021, 8:02 AM Please see Amion for pager number during day hours 7:00am-4:30pm

## 2021-06-01 NOTE — Progress Notes (Signed)
PHARMACY - TOTAL PARENTERAL NUTRITION CONSULT NOTE  Indication: Small bowel obstruction  Patient Measurements: Height: 5\' 11"  (180.3 cm) Weight: 66.9 kg (147 lb 7.8 oz) IBW/kg (Calculated) : 75.3 TPN AdjBW (KG): 69.9 Body mass index is 20.57 kg/m. Usual Weight: 154 lb 02/28/21  Assessment:  50 yom with ESRD admitted with worsening abdominal pain, found to have high-grade SBO. He had recent admission at [facility] for SBO resolved with conservative management; also had choledocholithiasis s/p ERCP with stone extraction, stent to CBD and PD. Lap chole recommended but declined. Surgery reports no flatus or BM except with enema on 12/5 and unlikely to resolve without surgical intervention. Given prolonged NPO status, pharmacy consulted for TPN for nutritional support. At risk for refeeding syndrome.  Glucose / Insulin: no hx DM, CBGs controlled. SSI d/c'd 12/10 Electrolytes: labs post HD (2K bath on 12/12) - K 3.5 [(received HD 4K bath 12/14 (goal >/= 4 for Afib/ileus)], Phos down 2.6 post HD, Mag 2 (goal >/= 2), Na 137 after increase in TPN, CoCa 10.4, others stable WNL Renal: ESRD on HD TTS - off schedule; Last HD 12/14. Hepatic: LFTs / TG WNL, tbili down 1.6 (no evidence of jaundice on exam 12/14), albumin 1.8 Intake / Output; MIVF: UOP 174mL, NG 662mL, drain 134mL; no flatus GI Imaging:  12/3 Abd XR: Probable persistent SBO not well evaluated 12/4 CT A/P: diffusely dilated SB with decompressed terminal ileum c/w SBO 12/6 Abd XR: dilation of small-bowel loops suggesting ileus or pSBO 12/7 Abd XR: unchanged 12/11 Abx XR: increased in dilated bowel loops 12/12 CT: small volume perihepatic ascites GI Surgeries / Procedures:  12/7 cholecystectomy with SBR 12/12 EGD: ulcerative esophagitis, bleeding AVM post clip/epi/APC  Central access: IJ placed by IR 05/23/21 TPN start date: 05/23/21  Nutritional Goals: RD Estimated Needs Total Energy Estimated Needs: 1900-2100 Total Protein  Estimated Needs: 100-115 gm Total Fluid Estimated Needs: 1.9 L  Current Nutrition:  TPN Clamping trials - tolerated 12/14 without emesis  Plan:  Continue concentrated TPN at goal rate of 65 mL/hr to provide 101g AA, 273g CHO and 58g ILE for a total of 1912 kCal, meeting 100% of needs Electrolytes in TPN: increase Na 158mEq/L, K 8 mEq/L (=12.4  mEq/day), continue Ca 63mEq/L, Mag 34mEq/L, Phos 71mmol/L, Cl:Ac 2:1 Add standard MVI and trace elements to TPN; remove chromium in patient on RRT F/U TPN labs Mon/Thurs, AM labs ordered  Continued NGT clamping - sips of clears. Follow-up for ability to advance diet.   Sloan Leiter, PharmD, BCPS, BCCCP Clinical Pharmacist Please refer to [facility] for [place] numbers 06/01/2021, 7:49 AM

## 2021-06-02 LAB — GLUCOSE, CAPILLARY
Glucose-Capillary: 103 mg/dL — ABNORMAL HIGH (ref 70–99)
Glucose-Capillary: 111 mg/dL — ABNORMAL HIGH (ref 70–99)
Glucose-Capillary: 121 mg/dL — ABNORMAL HIGH (ref 70–99)

## 2021-06-02 LAB — CBC
HCT: 24.2 % — ABNORMAL LOW (ref 39.0–52.0)
Hemoglobin: 8.2 g/dL — ABNORMAL LOW (ref 13.0–17.0)
MCH: 31.3 pg (ref 26.0–34.0)
MCHC: 33.9 g/dL (ref 30.0–36.0)
MCV: 92.4 fL (ref 80.0–100.0)
Platelets: 198 10*3/uL (ref 150–400)
RBC: 2.62 MIL/uL — ABNORMAL LOW (ref 4.22–5.81)
RDW: 20 % — ABNORMAL HIGH (ref 11.5–15.5)
WBC: 11.1 10*3/uL — ABNORMAL HIGH (ref 4.0–10.5)
nRBC: 0.4 % — ABNORMAL HIGH (ref 0.0–0.2)

## 2021-06-02 MED ORDER — TRACE MINERALS CU-MN-SE-ZN 300-55-60-3000 MCG/ML IV SOLN
INTRAVENOUS | Status: AC
  Filled 2021-06-02: qty 780

## 2021-06-02 MED ORDER — OXYCODONE HCL 5 MG PO TABS
5.0000 mg | ORAL_TABLET | ORAL | Status: DC | PRN
Start: 2021-06-02 — End: 2021-06-07
  Administered 2021-06-02 – 2021-06-06 (×14): 10 mg via ORAL
  Filled 2021-06-02 (×14): qty 2

## 2021-06-02 MED ORDER — TRAMADOL HCL 50 MG PO TABS
50.0000 mg | ORAL_TABLET | Freq: Two times a day (BID) | ORAL | Status: DC | PRN
  Administered 2021-06-02 – 2021-06-10 (×9): 50 mg via ORAL
  Filled 2021-06-02 (×9): qty 1

## 2021-06-02 NOTE — Progress Notes (Signed)
OT Cancellation Note  Patient Details Name: BRAXLEY BALANDRAN MRN: [id number] DOB: 20-Sep-1955   Cancelled Treatment:    Reason Eval/Treat Not Completed: Patient declined, no reason specified Pt reports recently going for a walk, receiving pain meds and just got comfortable in bed. Declined any OOB activities with OT at this time.  Layla Maw 06/02/2021, 12:21 PM

## 2021-06-02 NOTE — Progress Notes (Signed)
Mobility Specialist Progress Note    06/02/21 1128  Mobility  Activity Ambulated in hall  Level of Assistance Contact guard assist, steadying assist  Assistive Device  (IV pole, hallway railings)  Distance Ambulated (ft) 140 ft  Mobility Ambulated with assistance in hallway  Mobility Response Tolerated fair  Mobility performed by Mobility specialist  $Mobility charge 1 Mobility   Pre-Mobility: 89 HR, 128/76 BP During Mobility: 117 HR Post-Mobility: 97 HR  Pt received in bed and agreeable. Pt stated pain was just starting to get under control but felt like a 10/10. Upon standing pt wanted the [facility] to attempt BM and was successful. C/o stomach pain on walk. Returned to sitting EOB with call bell in reach.   [facility] Mobility Specialist  M.S. Primary Phone: [phone] M.S. Secondary Phone: [phone]

## 2021-06-02 NOTE — Progress Notes (Signed)
PHARMACY - TOTAL PARENTERAL NUTRITION CONSULT NOTE  Indication: Small bowel obstruction  Patient Measurements: Height: 5\' 11"  (180.3 cm) Weight: 67.7 kg (149 lb 4 oz) IBW/kg (Calculated) : 75.3 TPN AdjBW (KG): 69.9 Body mass index is 20.82 kg/m. Usual Weight: 154 lb 02/28/21  Assessment:  52 yom with ESRD admitted with worsening abdominal pain, found to have high-grade SBO. He had recent admission at [facility] for SBO resolved with conservative management; also had choledocholithiasis s/p ERCP with stone extraction, stent to CBD and PD. Lap chole recommended but declined. Surgery reports no flatus or BM except with enema on 12/5 and unlikely to resolve without surgical intervention. Given prolonged NPO status, pharmacy consulted for TPN for nutritional support. At risk for refeeding syndrome.  Glucose / Insulin: no hx DM,  BG < 120. Off insulin   Electrolytes: Last HD 12/14 4K bath (next planned 12/17) - K 3.5 (received 20-30 mEq IV x3 days plus 11 mEq in TPN, goal >/= 4 for Afib/ileus)], Phos down 2.6 post HD, Mag 2 (goal >/= 2), Na 137 after increase in TPN, CoCa 10.4, others stable WNL Renal: ESRD on HD TTS - off schedule; Last HD 12/14. Hepatic: LFTs / TG WNL, tbili down 1.6 (no evidence of jaundice on exam 12/14), albumin 1.8 Intake / Output; MIVF: UOP 400 mL charted, NGT clamped, drain removed; no flatus or BM  GI Imaging:  12/3 Abd XR: Probable persistent SBO not well evaluated 12/4 CT A/P: diffusely dilated SB with decompressed terminal ileum c/w SBO 12/6 Abd XR: dilation of small-bowel loops suggesting ileus or pSBO 12/7 Abd XR: unchanged 12/11 Abx XR: increased in dilated bowel loops 12/12 CT: Dilated mid small bowel loops, small volume perihepatic ascites GI Surgeries / Procedures:  12/7 cholecystectomy with SBR 12/12 EGD: ulcerative esophagitis, bleeding AVM post clip/epi/APC  Central access: IJ placed by IR 05/23/21 TPN start date: 05/23/21  Nutritional Goals: RD  Estimated Needs Total Energy Estimated Needs: 1900-2100 Total Protein Estimated Needs: 100-115 gm Total Fluid Estimated Needs: 1.9 L  Current Nutrition:  TPN NPO- NGT Clamped - tolerated 12/15 without emesis  Plan:  Continue concentrated TPN at goal rate of 65 mL/hr to provide 117g AA, 273g CHO and 58g ILE for a total of 1974 kCal, meeting 100% of needs Electrolytes in TPN: Increase K 15 mEq/L (= 23 mEq/day); Continue Na 149mEq/L,  Ca 2 mEq/L, Mag 4 mEq/L, Phos 10 mmol/L, Cl:Ac 2:1 Add standard MVI and trace elements to TPN; remove chromium in patient on dialysis F/U TPN labs 12/17 and 12/18 before and after dialysis to check for electrolyte stability and Mon/Thurs   Continued NGT clamping. Follow-up for ability to advance diet.   Benetta Spar, PharmD, BCPS, BCCP Clinical Pharmacist  Please check AMION for all [place] phone numbers After 10:00 PM, call [place] [phone]

## 2021-06-02 NOTE — TOC Progression Note (Signed)
Transition of Care [facility]) - Progression Note    Patient Details  Name: Patrick [name] MRN: [id number] Date of Birth: 07/25/1955  Transition of Care [facility]) CM/SW [place], LCSW Phone Number: 06/02/2021, 3:26 PM  Clinical Narrative:    CSW received consult for possible SNF placement at time of discharge. CSW spoke with patient at bedside. Patient reported that patient is currently unable to care for himself at his home given patients current physical needs and fall risk. Patient expressed understanding of PT recommendation and is agreeable to SNF placement at time of discharge. Patient reports no preference. CSW discussed insurance authorization process and will provide Medicare SNF ratings list. Patient has received the COVID vaccines. Patient expressed being hopeful for rehab and to feel better soon. No further questions reported at this time. The patient is still on TPN and will need to be off TPN before FL2 can be faxed out.  Skilled Nursing Rehab Facilities-   [email or web address]   Ratings out of 5 possible   Name Address  Phone # [place] Inspection Overall  [facility] [address], [place] 5 5 2 4   [facility]  [address] [place], [place] [phone] 4 2 5 5   [facility] [place], [place] 4 1 1 1   [place], [place] 2 2 4 4   [facility] [address], [place] 2 1 1 1   [place] 3 1 4 3   [facility] [address] [place] 5 2 2 3   [facility] [address], [place] 4 1 2 1   [place] 5 1 2 2   [facility] [address], [facility] [phone] 4 1 1 1   [facility] [address] [facility]  [phone] 4 1 2 1   [place] [address] [place] 4 1 1 1      CSW will continue to follow throughout discharge.  Expected Discharge Plan: Skilled Nursing Facility Barriers to Discharge: Continued Medical Work up  Expected Discharge Plan and Services Expected Discharge Plan: [place] In-house Referral: Clinical Social Work Discharge Planning Services: CM Consult   Living arrangements for the past 2 months: Apartment                                       Social Determinants of Health (SDOH) Interventions Food Insecurity Interventions: Intervention Not Indicated Financial Strain Interventions: Intervention Not Indicated Housing Interventions: Intervention Not Indicated Transportation Interventions: SCAT [address] [facility])  Readmission Risk Interventions Readmission Risk Prevention Plan 08/05/2020  Transportation Screening Complete  PCP or Specialist Appt within 3-5 Days Complete  HRI or [place] Complete  Social Work Consult for [place] Planning/Counseling Complete  Palliative Care Screening Not Applicable  Medication Review [address]) Complete  Some recent data might be hidden   [place], MSW, LCSWA [phone] Heart Failure Social Worker

## 2021-06-02 NOTE — Progress Notes (Signed)
PROGRESS NOTE   Patrick [name]  VQQ:[id number]    DOB: 1955/07/23    DOA: 05/19/2021  PCP: Patrick Dew, FNP   I have briefly reviewed patients previous medical records in [facility].  Chief Complaint  Patient presents with   Small Bowel Obstruction    Brief Narrative:  65 year old male with medical history significant for ESRD on TTS HD, chronic systolic CHF, COPD, pulmonary hypertension, sickle cell anemia, chronic A. fib on Eliquis, SBO, choledocholithiasis, presented to [facility] on 05/19/2021 with recurrent SBO and choledocholithiasis.  On 05/09/2021, patient was hospitalized at [place] with SBO and choledocholithiasis.  SBO resolved with conservative measures and choledocholithiasis improved with ERCP and stent placement.    12/7: Underwent exploratory laparotomy, surgical resection of SBO and cholecystectomy.  Postprocedure, patient remained on vasopressors and intubated.  Extubated 12/8 and transferred to [facility] on 12/9.  12/11, noted persistent bloody drainage from NGT, general surgery and [facility] on board, s/p DDAVP and Protonix infusion.  12/12: EGD showed significant amount of blood in the stomach, lavaged and AVM bleeding, treated endoscopically.  Care transferred back from PCCM to [facility] on 12/14.  Course complicated by prolonged postop ileus.   Assessment & Plan:  Principal Problem:   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 05/24/2021 Active Problems:   Abdominal pain   Nausea & vomiting   Anemia   End stage renal disease (HCC)   Permanent atrial fibrillation (HCC)   Chronic systolic CHF (congestive heart failure) (HCC)   Upper GI bleed   SBO (small bowel obstruction) (HCC)   Protein-calorie malnutrition, severe   ABLA (acute blood loss anemia)   Empyema of gallbladder   Acute gangrenous cholecystitis s/p open cholcystectomy 05/24/2021    SBO due to chronic ischemic segment of SB, postop ileus: 12/7: S/p diagnostic laparoscopy converted to  exploratory laparotomies for small bowel resection and cholecystectomy for gangrenous cholecystitis and chronic ischemic segment of SB causing an obstruction.  General surgery continues to follow.  Twice daily dressing changes.  Prolonged postop ileus.  TNA for nutritional support.  Completed 5 days of Zosyn 12/12.  Ongoing prolonged postop ileus.  General surgery managing.  Tolerating prolonged periods of NG tube clamping and some sips of clear liquids.  Passing some flatus.  No BM yet.  JP drain discontinued.  Off PCA.  Gangrenous cholecystitis: 12/7 cholecystectomy; recent Choledocholithiasis s/p recent ERCP stent placement at [facility]  Acute upper GI bleed S/p DDAVP 12/11.  [facility] on board.  S/p EGD 12/12 showed significant amount of blood in the stomach, lavaged and AVM bleeding in the stomach.  Unclear if due to trauma from NG causing this.  Treated endoscopically.  NG still draining dark liquid.  IV PPI fusion transition to PPI twice daily.  Eliquis on hold.  S/p PRBC transfusion 12/13.  Hemoglobin has been stable since.  Upper GI bleed seems to have clinically resolved.  ESRD: on HD tues, thurs, sat Nephrology following.  S/p HD on 12/14.  Hypokalemia Replaced.   Acute Blood Loss Anemia  >> AVM GI Bleed per EGD Unsure cause - Prior EGD in 2019 showed nonbleeding gastritis, Colonoscopy showed small internal hemorrhoids, otherwise normal -Last PRBC transfusion 12/13.  Hemoglobin has been stable in the low 8 g range.  Continue to follow daily CBC.  Chronic Anemia: sickle cell and CKD.  Management as above.  Thrombocytopenia Resolved.   HX COPD:  - takes incruse and ellipta at home.  No clinical  bronchospasm. -Continue brovana, yupelri, and pulmicort -Pulm toilet as able -Not hypoxic.   Chronic combined systolic and diastolic CHF:  - echo 4580 EF of 40 to 45% with mild right ventricular dysfunction and severe biatrial enlargement and severe mitral regurg and tricuspid  regurg -Volume management across HD.  Does not appear overtly volume overloaded.  Persistent Afib/atrial flutter with RVR:  - on eliquis at home, currently on hold due to postop ileus and upper GI bleed. - rate controlled  Pulm HTN -Heart Failure following -Holding home Hydralazine, Imdur, Torsemide given borderline hypotension    Sickle cell disease Plan: -No sickle cell related pain.  PCA discontinued.  On multimodality pain control.   Protein calorie malnutrition Plan: -Continue TPN  HTN: -Controlled.    Body mass index is 20.82 kg/m.  Nutritional Status Nutrition Problem: Severe Malnutrition Etiology: acute illness (SBO) Signs/Symptoms: moderate fat depletion, moderate muscle depletion, percent weight loss (8% weight loss within 3 months) Percent weight loss: 8 % Interventions: TPN  Pressure Ulcer:     DVT prophylaxis: SCDs Start: 05/19/21 2116     Code Status: Full Code Family Communication: None at bedside. Disposition:  Status is: Inpatient  Remains inpatient appropriate because: Severity of illness including postop ileus, NG tube, TNA,        Consultants:   PCCM General surgery [facility]phrology AHF team  Procedures:   As above.  Antimicrobials:   As above.  Subjective:  Waxing and waning abdominal pain.  Reports that his abdominal distention is not worse but does look slightly more distended.  Passing some flatus.  No BM yet.  Was up at edge of bed for about 30 minutes yesterday.  Has not walked.  Objective:   Vitals:   06/01/21 2002 06/01/21 2343 06/02/21 0257 06/02/21 0744  BP: 121/65 (!) 97/53 131/84 130/74  Pulse: 81 74 96 88  Resp: 16 14 20 19   Temp: 98 F (36.7 C) 98.4 F (36.9 C) 97.9 F (36.6 C)   TempSrc: Oral Oral Oral   SpO2: 98% 100% 100% 96%  Weight:   67.7 kg   Height:        General exam: [name], moderately built and nourished lying comfortably supine in bed, no distress noted. Respiratory system:  Clear to auscultation.  No increased work of breathing.  Right upper anterior chest Port-A-Cath. Cardiovascular system: S1 & S2 heard, irregularly irregular. No JVD, murmurs, rubs, gallops or clicks. No pedal edema. Gastrointestinal system: Abdomen appears slightly more distended, tympanic, nontender.  Surgical site dressings clean and dry, not soiled.  Absent bowel sounds during my visit. Central nervous system: Alert and oriented. No focal neurological deficits. Extremities: Symmetric 5 x 5 power. Skin: No rashes, lesions or ulcers Psychiatry: Judgement and insight appear normal. Mood & affect appropriate.     Data Reviewed:   I have personally reviewed following labs and imaging studies   CBC: Recent Labs  Lab 05/28/21 2031 05/29/21 0340 05/31/21 0238 05/31/21 1518 06/01/21 0517 06/01/21 1755 06/02/21 0644  WBC 13.0*   < > 14.4*   < > 13.0* 11.1* 11.1*  NEUTROABS 10.3*  --  11.4*  --   --   --   --   HGB 6.6*   < > 7.9*   < > 7.7* 8.0* 8.2*  HCT 18.7*   < > 22.5*   < > 22.8* 24.2* 24.2*  MCV 90.8   < > 86.9   < > 89.1 98.8 92.4  PLT 93*   < >  132*   < > 164 171 198   < > = values in this interval not displayed.    Basic Metabolic Panel: Recent Labs  Lab 05/28/21 2031 05/29/21 0340 05/30/21 0427 05/30/21 1205 05/30/21 1914 05/31/21 0238 06/01/21 0517  NA 137 137 135 138  --  134* 137  K 3.6 3.6 2.7* 3.4*  --  3.4* 3.5  CL 104 103 99 100  --  100 101  CO2 24 25 28 26   --  26 25  GLUCOSE 126* 111* 118* 111*  --  105* 108*  BUN 60* 65* 24* 33*  --  47* 43*  CREATININE 5.35* 5.72* 2.72* 3.18*  --  4.24* 3.40*  CALCIUM 8.2* 8.3* 8.2* 8.7*  --  8.3* 8.6*  MG 1.9 2.2 1.9  --   --  2.3 2.0  PHOS  --  3.1 1.5*  --  3.2 3.7 2.6    Liver Function Tests: Recent Labs  Lab 05/28/21 2031 05/29/21 0340 05/30/21 0427 05/31/21 0238 06/01/21 0517  AST 16 14* 18 19 23   ALT 5 6 9 9 13   ALKPHOS 88 79 99 110 161*  BILITOT 1.4* 1.4* 1.3* 2.1* 1.6*  PROT 4.7* 4.6* 5.1*  4.8* 5.1*  ALBUMIN 1.6* 1.5* 1.7* 1.6* 1.8*    CBG: Recent Labs  Lab 06/01/21 2001 06/01/21 2342 06/02/21 0305  GLUCAP 111* 113* 103*    Microbiology Studies:   Recent Results (from the past 240 hour(s))  MRSA Next Gen by PCR, Nasal     Status: None   Collection Time: 05/24/21  5:54 PM   Specimen: Nasal Mucosa; Nasal Swab  Result Value Ref Range Status   MRSA by PCR Next Gen NOT DETECTED NOT DETECTED Final    Comment: (NOTE) The GeneXpert MRSA Assay (FDA approved for NASAL specimens only), is one component of a comprehensive MRSA colonization surveillance program. It is not intended to diagnose MRSA infection nor to guide or monitor treatment for MRSA infections. Test performance is not FDA approved in patients less than 61 years old. Performed at [facility], [place] [address], [place], [place] [address]     Radiology Studies:  No results found.  Scheduled Meds:    acetaminophen  1,000 mg Oral Q6H   arformoterol  15 mcg Nebulization BID   budesonide (PULMICORT) nebulizer solution  0.25 mg Nebulization BID   Chlorhexidine Gluconate Cloth  6 each Topical Daily   darbepoetin (ARANESP) injection - DIALYSIS  200 mcg Intravenous Q Wed-HD   mouth rinse  15 mL Mouth Rinse BID   methocarbamol  1,000 mg Oral TID   pantoprazole  40 mg Intravenous Q12H   revefenacin  175 mcg Nebulization Daily   sodium chloride flush  10-40 mL Intracatheter Q12H    Continuous Infusions:    TPN ADULT (ION) 65 mL/hr at 06/01/21 1823   TPN ADULT (ION)       LOS: 14 days     Vernell Leep, MD,  FACP, [facility], [facility], [facility] (Care Management Physician Certified) [place]  To contact the attending provider between 7A-7P or the covering provider during after hours 7P-7A, please log into the web site [email or web address] and access using universal [place] password for that web site. If you do not have the password, please call the hospital  operator.  06/02/2021, 8:54 AM

## 2021-06-02 NOTE — Progress Notes (Signed)
Progress Note  4 Days Post-Op  Subjective: Started passing flatus yesterday and thought he may need to have a BM this morning, but didn't.  No nausea with NGT clamped all day.  Had a few sips of liquids yesterday with no real issues.  Got up to the EOB and did some exercises.  Pain not well controlled yesterday.  Working on this today.  NG returned to suction today and only 100cc of output was noted (completely nonbloody) after 24 hrs of clamping.  Objective: Vital signs in last 24 hours: Temp:  [97.9 F (36.6 C)-98.7 F (37.1 C)] 97.9 F (36.6 C) (12/16 0257) Pulse Rate:  [74-96] 88 (12/16 0744) Resp:  [12-20] 19 (12/16 0744) BP: (97-131)/(53-84) 130/74 (12/16 0744) SpO2:  [94 %-100 %] 96 % (12/16 0744) Weight:  [67.7 kg] 67.7 kg (12/16 0257) Last BM Date: 05/22/21  Intake/Output from previous day: 12/15 0701 - 12/16 0700 In: 584.6 [I.V.:526; IV Piggyback:58.6] Out: 400 [Urine:400] Intake/Output this shift: No intake/output data recorded.  PE: General: NAD Abd: soft, appropriately tender, NGT in place and residual checked with only 100cc noted.  Midline wound is intact, but adipose tissue yellow and brown.  No drainage noted from JP site this morning.   Lab Results:  Recent Labs    06/01/21 1755 06/02/21 0644  WBC 11.1* 11.1*  HGB 8.0* 8.2*  HCT 24.2* 24.2*  PLT 171 198   BMET Recent Labs    05/31/21 0238 06/01/21 0517  NA 134* 137  K 3.4* 3.5  CL 100 101  CO2 26 25  GLUCOSE 105* 108*  BUN 47* 43*  CREATININE 4.24* 3.40*  CALCIUM 8.3* 8.6*   PT/INR No results for input(s): LABPROT, INR in the last 72 hours.  CMP     Component Value Date/Time   NA 137 06/01/2021 0517   NA 135 05/24/2020 1119   K 3.5 06/01/2021 0517   CL 101 06/01/2021 0517   CO2 25 06/01/2021 0517   GLUCOSE 108 (H) 06/01/2021 0517   BUN 43 (H) 06/01/2021 0517   BUN 25 05/24/2020 1119   CREATININE 3.40 (H) 06/01/2021 0517   CREATININE 2.90 (H) 08/11/2015 1359   CALCIUM 8.6 (L)  06/01/2021 0517   PROT 5.1 (L) 06/01/2021 0517   PROT 7.5 05/24/2020 1119   ALBUMIN 1.8 (L) 06/01/2021 0517   ALBUMIN 4.1 05/24/2020 1119   AST 23 06/01/2021 0517   ALT 13 06/01/2021 0517   ALKPHOS 161 (H) 06/01/2021 0517   BILITOT 1.6 (H) 06/01/2021 0517   BILITOT 2.4 (H) 05/24/2020 1119   GFRNONAA 19 (L) 06/01/2021 0517   GFRNONAA 23 (L) 08/11/2015 1359   GFRAA 24 (L) 05/24/2020 1119   GFRAA 27 (L) 08/11/2015 1359   Lipase     Component Value Date/Time   LIPASE 51 05/19/2021 1445       Studies/Results: No results found.  Anti-infectives: Anti-infectives (From admission, onward)    Start     Dose/Rate Route Frequency Ordered Stop   05/24/21 1730  piperacillin-tazobactam (ZOSYN) IVPB 2.25 g  Status:  Discontinued        2.25 g 100 mL/hr over 30 Minutes Intravenous Every 8 hours 05/24/21 1654 05/29/21 1303   05/24/21 1615  metroNIDAZOLE (FLAGYL) IVPB 500 mg       Note to Pharmacy: Please tube to #[phone]    500 mg 100 mL/hr over 60 Minutes Intravenous  Once 05/24/21 1603 05/24/21 1630        Assessment/Plan POD 9, s/p  dx lap converted to ex lap for SBR and cholecystectomy for gangrenous cholecystitis and chronic ischemic segment of SB causing an obstruction, Dr. Donne Hazel 05/24/21 -[facility] -BID dressing changes -prn dressing changes over JP drain site. -DC NGT and give CLD -mobilize/pulm toilet -TNA for nutritional support while has ileus -zosyn stopped 12/12 after 5 days post op completed -tramadol for oral pain control (to minimize risk of nausea with oxy, but given renal function etc may switch to oxy if unable to get good pain relief with 50 q 12 hr), robaxin, tylenol, and prn dilaudid for severe or breakthrough pain.   FEN: CLD,TPN VTE: SCDs, DVT prophylaxis on hold til hgb stabilizes ID: [email or web address], completed 12/12  UGI bleed - secondary to AVM in the gastric body, s/p tx by GI yesterday.  Hgb stable ESRD - TTS, per renal CHF - per cards A. Fib on  eliquis, on hold Anemia - hgb 8.2 Acute hypoxic respiratory failure - extubated and doing well   LOS: 14 days    Henreitta Cea, [facility] Surgery 06/02/2021, 9:03 AM Please see Amion for pager number during day hours 7:00am-4:30pm

## 2021-06-02 NOTE — Progress Notes (Signed)
Subjective:  518 + and 360 out yest, +158 net. NG tube out, +flatus.   Objective Vital signs in last 24 hours: Vitals:   06/01/21 2343 06/02/21 0257 06/02/21 0744 06/02/21 0918  BP: (!) 97/53 131/84 130/74   Pulse: 74 96 88   Resp: 14 20 19    Temp: 98.4 F (36.9 C) 97.9 F (36.6 C)    TempSrc: Oral Oral    SpO2: 100% 100% 96% 100%  Weight:  67.7 kg    Height:       Weight change: 1.4 kg  Physical Exam: General: thin chronically ill male, on room air, NG in place Heart: RRR no MRG noted Lungs: coarse BS b/l Abdomen: incisions bandaged Extremities: No pedal edema  dialysis Access: RUA aVF+ bruit  OP HD: TTS AF   4h  400/500   65kg  2/2 bath  P2  AVF  Hep none - Sensipar 30mg  PO q HD - Venofer 50mg  IV weekly - Mircera 239mcg IV q 2 weeks (last 11/8)   Assessment/ Plan SBO/ recent gallstone with CBD &pancreatic duct stenting: s/p ex lap here 12/07 w/ cholecystectomy and SB resection (gangrenous GB and chronic SB ischemia). +_flatus overnight, NG pulled.  remains on TPN. Per gen surgery.  UGIB - d/t AVM in stomach, rx'd by GI w/ APC/ endoclips/ epi sq on 12/12  ESRD: usual HD TTS. Has been off schedule this week x 2. Next HD Sat to get back on schedule.  HTN/volume: BP stable, up 2-3kg. No edema by exam. UF goal next HD 2-3 L.  Anemia CKD/ sickle cell/ abl-  Aranesp with HD weekly on Tuesday, last 12/14.  Tsat 34%, no Fe needed. Hb 8's now, sp 9u prbc's total here.  Hypokalemia - up to 3.5 yesterday  Metabolic bone disease: Ca/Phos ok - resume binders when eating  Nutrition: on TPN,+ NGT, will order supplements when cleared to eat.  A-fib: per cardiology   Kelly Splinter, MD 05/31/2021, 12:01 PM      Labs: Basic Metabolic Panel: Recent Labs  Lab 05/30/21 1205 05/30/21 1914 05/31/21 0238 06/01/21 0517  NA 138  --  134* 137  K 3.4*  --  3.4* 3.5  CL 100  --  100 101  CO2 26  --  26 25  GLUCOSE 111*  --  105* 108*  BUN 33*  --  47* 43*  CREATININE 3.18*  --   4.24* 3.40*  CALCIUM 8.7*  --  8.3* 8.6*  PHOS  --  3.2 3.7 2.6    Liver Function Tests: Recent Labs  Lab 05/30/21 0427 05/31/21 0238 06/01/21 0517  AST 18 19 23   ALT 9 9 13   ALKPHOS 99 110 161*  BILITOT 1.3* 2.1* 1.6*  PROT 5.1* 4.8* 5.1*  ALBUMIN 1.7* 1.6* 1.8*    No results for input(s): LIPASE, AMYLASE in the last 168 hours.  No results for input(s): AMMONIA in the last 168 hours. CBC: Recent Labs  Lab 05/28/21 2031 05/29/21 0340 05/31/21 0238 05/31/21 1518 06/01/21 0517 06/01/21 1755 06/02/21 0644  WBC 13.0*   < > 14.4* 14.1* 13.0* 11.1* 11.1*  NEUTROABS 10.3*  --  11.4*  --   --   --   --   HGB 6.6*   < > 7.9* 8.3* 7.7* 8.0* 8.2*  HCT 18.7*   < > 22.5* 24.7* 22.8* 24.2* 24.2*  MCV 90.8   < > 86.9 88.5 89.1 98.8 92.4  PLT 93*   < > 132* 143* 164  171 198   < > = values in this interval not displayed.    Cardiac Enzymes: No results for input(s): CKTOTAL, CKMB, CKMBINDEX, TROPONINI in the last 168 hours. CBG: Recent Labs  Lab 06/01/21 1132 06/01/21 1652 06/01/21 2001 06/01/21 2342 06/02/21 0305  GLUCAP 110* 111* 111* 113* 103*      Medications:  TPN ADULT (ION) 65 mL/hr at 06/01/21 1823   TPN ADULT (ION)      acetaminophen  1,000 mg Oral Q6H   arformoterol  15 mcg Nebulization BID   budesonide (PULMICORT) nebulizer solution  0.25 mg Nebulization BID   Chlorhexidine Gluconate Cloth  6 each Topical Daily   darbepoetin (ARANESP) injection - DIALYSIS  200 mcg Intravenous Q Wed-HD   mouth rinse  15 mL Mouth Rinse BID   methocarbamol  1,000 mg Oral TID   pantoprazole  40 mg Intravenous Q12H   revefenacin  175 mcg Nebulization Daily   sodium chloride flush  10-40 mL Intracatheter Q12H

## 2021-06-02 NOTE — Progress Notes (Signed)
PT Cancellation Note  Patient Details Name: Patrick [name] MRN: [id number] DOB: 1955-10-17   Cancelled Treatment:    Reason Eval/Treat Not Completed: Pain limiting ability to participate at this time. After coordinating pre-medication with the pt's RN, the pt reports he is worried about pain exacerbation with mobility at this time and declined all attempts at OOB mobility or bed-level exercises. Pt request sPT return again tomorrow for exercises. Continued to try to encourage multiple bouts of movement each day, pt verbalized understanding.   West Carbo, PT, DPT   Acute Rehabilitation Department Pager #: [phone]   Sandra Cockayne 06/02/2021, 4:34 PM

## 2021-06-03 LAB — BASIC METABOLIC PANEL
Anion gap: 10 (ref 5–15)
BUN: 73 mg/dL — ABNORMAL HIGH (ref 8–23)
CO2: 16 mmol/L — ABNORMAL LOW (ref 22–32)
Calcium: 7.2 mg/dL — ABNORMAL LOW (ref 8.9–10.3)
Chloride: 111 mmol/L (ref 98–111)
Creatinine, Ser: 4.54 mg/dL — ABNORMAL HIGH (ref 0.61–1.24)
GFR, Estimated: 14 mL/min — ABNORMAL LOW (ref >60)
Glucose, Bld: 90 mg/dL (ref 70–99)
Potassium: 2.9 mmol/L — ABNORMAL LOW (ref 3.5–5.1)
Sodium: 137 mmol/L (ref 135–145)

## 2021-06-03 LAB — CBC
HCT: 23.4 % — ABNORMAL LOW (ref 39.0–52.0)
Hemoglobin: 7.7 g/dL — ABNORMAL LOW (ref 13.0–17.0)
MCH: 31.6 pg (ref 26.0–34.0)
MCHC: 32.9 g/dL (ref 30.0–36.0)
MCV: 95.9 fL (ref 80.0–100.0)
Platelets: 224 10*3/uL (ref 150–400)
RBC: 2.44 MIL/uL — ABNORMAL LOW (ref 4.22–5.81)
RDW: 20.6 % — ABNORMAL HIGH (ref 11.5–15.5)
WBC: 10.7 10*3/uL — ABNORMAL HIGH (ref 4.0–10.5)
nRBC: 0.6 % — ABNORMAL HIGH (ref 0.0–0.2)

## 2021-06-03 LAB — GLUCOSE, CAPILLARY: Glucose-Capillary: 104 mg/dL — ABNORMAL HIGH (ref 70–99)

## 2021-06-03 LAB — PHOSPHORUS: Phosphorus: 3.6 mg/dL (ref 2.5–4.6)

## 2021-06-03 LAB — MAGNESIUM: Magnesium: 1.7 mg/dL (ref 1.7–2.4)

## 2021-06-03 MED ORDER — TRACE MINERALS CU-MN-SE-ZN 300-55-60-3000 MCG/ML IV SOLN
INTRAVENOUS | Status: AC
  Filled 2021-06-03: qty 780

## 2021-06-03 MED ORDER — MAGNESIUM SULFATE 2 GM/50ML IV SOLN
2.0000 g | Freq: Once | INTRAVENOUS | Status: AC
Start: 2021-06-03 — End: 2021-06-03
  Administered 2021-06-03: 2 g via INTRAVENOUS
  Filled 2021-06-03: qty 50

## 2021-06-03 MED ORDER — POTASSIUM CHLORIDE 10 MEQ/100ML IV SOLN
10.0000 meq | INTRAVENOUS | Status: AC
  Administered 2021-06-03 (×2): 10 meq via INTRAVENOUS
  Filled 2021-06-03: qty 100

## 2021-06-03 NOTE — Progress Notes (Signed)
Subjective:   seen in HD, abd pain remains. Getting food today.   Objective Vital signs in last 24 hours: Vitals:   06/03/21 1230 06/03/21 1300 06/03/21 1330 06/03/21 1400  BP: 114/79 119/77 116/63 129/66  Pulse: 84 81 87 92  Resp:    (!) 21  Temp:    97.8 F (36.6 C)  TempSrc:    Oral  SpO2:    100%  Weight:    68 kg  Height:       Weight change: 1.2 kg  Physical Exam: General: thin chronically ill male, on room air, NG removed now Heart: RRR no MRG noted Lungs: coarse BS b/l Abdomen: incisions bandaged Extremities: No pedal edema  dialysis Access: RUA aVF+ bruit  OP HD: TTS AF   4h  400/500   65kg  2/2 bath  P2  AVF  Hep none - Sensipar 30mg  PO q HD - Venofer 50mg  IV weekly - Mircera 275mcg IV q 2 weeks (last 11/8)   Assessment/ Plan SBO/ sp ex lap w/ cholecystectomy and SB resection - on 12/07, done for gangrenous GB and chronic SB ischemia. NG out, advancing diet, remains on TPN. Per gen surgery.  SP recent gallstone with CBD &pancreatic duct stenting UGIB - d/t AVM in stomach, rx'd by GI w/ APC/ endoclips/ epi sq on 12/12  ESRD: usual HD TTS. Has been off schedule this week x 2. Next HD Sat to get back on schedule.  HTN/volume: BP stable, up 5kg. UF goal today 2.5 L.  Anemia CKD/ sickle cell/ abl-  Aranesp with HD weekly on Wed, last 12/14.  Tsat 34%, no Fe needed. Hb 8's now, sp 9u prbc's total here.  Hypokalemia - up to 3.5 yesterday  Metabolic bone disease: Ca/Phos ok - resume binders when eating  Nutrition: on TPN,+ NGT, will order supplements when cleared to eat.  A-fib: per cardiology   Kelly Splinter, MD 05/31/2021, 4:51 PM      Labs: Basic Metabolic Panel: Recent Labs  Lab 05/31/21 0238 06/01/21 0517 06/03/21 0500  NA 134* 137 137  K 3.4* 3.5 2.9*  CL 100 101 111  CO2 26 25 16*  GLUCOSE 105* 108* 90  BUN 47* 43* 73*  CREATININE 4.24* 3.40* 4.54*  CALCIUM 8.3* 8.6* 7.2*  PHOS 3.7 2.6 3.6    Liver Function Tests: Recent Labs  Lab  05/30/21 0427 05/31/21 0238 06/01/21 0517  AST 18 19 23   ALT 9 9 13   ALKPHOS 99 110 161*  BILITOT 1.3* 2.1* 1.6*  PROT 5.1* 4.8* 5.1*  ALBUMIN 1.7* 1.6* 1.8*    No results for input(s): LIPASE, AMYLASE in the last 168 hours.  No results for input(s): AMMONIA in the last 168 hours. CBC: Recent Labs  Lab 05/28/21 2031 05/29/21 0340 05/31/21 0238 05/31/21 1518 06/01/21 0517 06/01/21 1755 06/02/21 0644 06/03/21 0335  WBC 13.0*   < > 14.4* 14.1* 13.0* 11.1* 11.1* 10.7*  NEUTROABS 10.3*  --  11.4*  --   --   --   --   --   HGB 6.6*   < > 7.9* 8.3* 7.7* 8.0* 8.2* 7.7*  HCT 18.7*   < > 22.5* 24.7* 22.8* 24.2* 24.2* 23.4*  MCV 90.8   < > 86.9 88.5 89.1 98.8 92.4 95.9  PLT 93*   < > 132* 143* 164 171 198 224   < > = values in this interval not displayed.    Cardiac Enzymes: No results for input(s): CKTOTAL, CKMB, CKMBINDEX, TROPONINI  in the last 168 hours. CBG: Recent Labs  Lab 06/02/21 0305 06/02/21 2110 06/02/21 2351 06/03/21 0319 06/03/21 0321  GLUCAP 103* 121* 111* >600* 104*      Medications:  TPN ADULT (ION) 65 mL/hr at 06/02/21 1739   TPN ADULT (ION)      acetaminophen  1,000 mg Oral Q6H   arformoterol  15 mcg Nebulization BID   budesonide (PULMICORT) nebulizer solution  0.25 mg Nebulization BID   Chlorhexidine Gluconate Cloth  6 each Topical Daily   darbepoetin (ARANESP) injection - DIALYSIS  200 mcg Intravenous Q Wed-HD   mouth rinse  15 mL Mouth Rinse BID   methocarbamol  1,000 mg Oral TID   pantoprazole  40 mg Intravenous Q12H   revefenacin  175 mcg Nebulization Daily   sodium chloride flush  10-40 mL Intracatheter Q12H

## 2021-06-03 NOTE — Progress Notes (Signed)
PT Cancellation Note  Patient Details Name: PRAKASH KIMBERLING MRN: [id number] DOB: April 28, 1956   Cancelled Treatment:    Reason Eval/Treat Not Completed: Pain limiting ability to participate this morning. Pt resting with all VSS and eyes closed upon arrival of PT, reports 8/10 pain after pre-medication, but also that his pain never gets lower than 8/10. Pt did not open his eyes to talk to PT, insists we return later, stating he prefers to work with PT after dialysis. Will attempt to circle back as time/schedule allows.   West Carbo, PT, DPT   Acute Rehabilitation Department Pager #: [phone]   Sandra Cockayne 06/03/2021, 8:24 AM

## 2021-06-03 NOTE — Progress Notes (Signed)
PHARMACY - TOTAL PARENTERAL NUTRITION CONSULT NOTE  Indication: Small bowel obstruction  Patient Measurements: Height: 5\' 11"  (180.3 cm) Weight: 68.9 kg (151 lb 14.4 oz) IBW/kg (Calculated) : 75.3 TPN AdjBW (KG): 69.9 Body mass index is 21.19 kg/m. Usual Weight: 154 lb 02/28/21  Assessment:  6 yom with ESRD admitted with worsening abdominal pain, found to have high-grade SBO. He had recent admission at [facility] for SBO resolved with conservative management; also had choledocholithiasis s/p ERCP with stone extraction, stent to CBD and PD. Lap chole recommended but declined. Surgery reports no flatus or BM except with enema on 12/5 and unlikely to resolve without surgical intervention. Given prolonged NPO status, pharmacy consulted for TPN for nutritional support. At risk for refeeding syndrome.  Glucose / Insulin: no hx DM,  CBGs remain <120. Off insulin   Electrolytes: Last HD 12/14 4K bath (next planned 12/17) - K 2.9 (Scheduled 4K bath today, goal >/= 4 for Afib/ileus), Phos up 3.6, Mag 1.7 (goal >/= 2), Na 137 Renal: ESRD on HD TTS - plan to go back on schedule today; Last HD 12/14. Hepatic: LFTs / TG WNL, tbili down 1.6 (no evidence of jaundice on exam 12/14), albumin 1.8 Intake / Output; MIVF: UOP 0.2 ml/kg/hr, NGT clamped, drain removed; no flatus or BM  GI Imaging:  12/3 Abd XR: Probable persistent SBO not well evaluated 12/4 CT A/P: diffusely dilated SB with decompressed terminal ileum c/w SBO 12/6 Abd XR: dilation of small-bowel loops suggesting ileus or pSBO 12/7 Abd XR: unchanged 12/11 Abx XR: increased in dilated bowel loops 12/12 CT: Dilated mid small bowel loops, small volume perihepatic ascites GI Surgeries / Procedures:  12/7 cholecystectomy with SBR 12/12 EGD: ulcerative esophagitis, bleeding AVM post clip/epi/APC  Central access: IJ placed by IR 05/23/21 TPN start date: 05/23/21  Nutritional Goals: RD Estimated Needs Total Energy Estimated Needs:  1900-2100 Total Protein Estimated Needs: 100-115 gm Total Fluid Estimated Needs: 1.9 L  Current Nutrition:  TPN NPO- NGT Clamped - tolerated 12/15 without emesis  Plan:  Continue concentrated TPN at goal rate of 65 mL/hr to provide 117g AA, 273g CHO and 58g ILE for a total of 1974 kCal, meeting 100% of needs Electrolytes in TPN: Increase K 20 mEq/L (= 31 mEq/day); Continue Na 135mEq/L,  Ca 2 mEq/L, Mag increase to 5 mEq/L, Phos 10 mmol/L, Cl:Ac 2:1 Give Mag 2g IV x 1 Give KCl x 3 runs Add standard MVI and trace elements to TPN; remove chromium in patient on dialysis F/U TPN labs 12/18 after dialysis to check for electrolyte stability and Mon/Thurs   Continued NGT clamping. Follow-up for ability to advance diet.   Bertis Ruddy, PharmD Clinical Pharmacist ED Pharmacist Phone # [phone] 06/03/2021 7:49 AM

## 2021-06-03 NOTE — Progress Notes (Signed)
PROGRESS NOTE   Patrick [name]  ONG:[id number]    DOB: 06-04-1956    DOA: 05/19/2021  PCP: Dorena Dew, FNP   I have briefly reviewed patients previous medical records in [facility].  Chief Complaint  Patient presents with   Small Bowel Obstruction    Brief Narrative:  65 year old male with medical history significant for ESRD on TTS HD, chronic systolic CHF, COPD, pulmonary hypertension, sickle cell anemia, chronic A. fib on Eliquis, SBO, choledocholithiasis, presented to [facility] on 05/19/2021 with recurrent SBO and choledocholithiasis.  On 05/09/2021, patient was hospitalized at [facility] with SBO and choledocholithiasis.  SBO resolved with conservative measures and choledocholithiasis improved with ERCP and stent placement.    12/7: Underwent exploratory laparotomy, surgical resection of SBO and cholecystectomy.  Postprocedure, patient remained on vasopressors and intubated.  Extubated 12/8 and transferred to [facility] on 12/9.  12/11, noted persistent bloody drainage from NGT, general surgery and [facility] on board, s/p DDAVP and Protonix infusion.  12/12: EGD showed significant amount of blood in the stomach, lavaged and AVM bleeding, treated endoscopically.  Care transferred back from PCCM to [facility] on 12/14.  Course complicated by prolonged postop ileus, finally starting to improve.   Assessment & Plan:  Principal Problem:   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 05/24/2021 Active Problems:   Abdominal pain   Nausea & vomiting   Anemia   End stage renal disease (HCC)   Permanent atrial fibrillation (HCC)   Chronic systolic CHF (congestive heart failure) (HCC)   Upper GI bleed   SBO (small bowel obstruction) (HCC)   Protein-calorie malnutrition, severe   ABLA (acute blood loss anemia)   Empyema of gallbladder   Acute gangrenous cholecystitis s/p open cholcystectomy 05/24/2021    SBO due to chronic ischemic segment of SB, postop ileus: 12/7: S/p  diagnostic laparoscopy converted to exploratory laparotomies for small bowel resection and cholecystectomy for gangrenous cholecystitis and chronic ischemic segment of SB causing an obstruction.  General surgery continues to follow.  Twice daily dressing changes.  Prolonged postop ileus.  TNA for nutritional support.  Completed 5 days of Zosyn 12/12.  Course complicated by prolonged postop ileus.  General surgery managing.  DC'd PCA and JP drain.  NGT discontinued.  Started clear liquids.  Had BMs x2 in the last 24 hours.  Advancing to full liquid followed by soft diet today.   Gangrenous cholecystitis: 12/7 cholecystectomy; recent Choledocholithiasis s/p recent ERCP stent placement at [facility]  Acute upper GI bleed S/p DDAVP 12/11.  [facility] on board.  S/p EGD 12/12 showed significant amount of blood in the stomach, lavaged and AVM bleeding in the stomach.  Unclear if due to trauma from NG causing this.  Treated endoscopically.  NG still draining dark liquid.  IV PPI fusion transition to PPI twice daily.  Eliquis on hold.  S/p PRBC transfusion 12/13.  Hemoglobin has been stable since.  Upper GI bleed seems to have clinically resolved.  ESRD: on HD tues, thurs, sat Nephrology following.  Is supposed to be for HD 12/17.  Hypokalemia Pharmacy replacing IV.  He is on TPN.  Magnesium 1.7.   Acute Blood Loss Anemia  >> AVM GI Bleed per EGD Unsure cause - Prior EGD in 2019 showed nonbleeding gastritis, Colonoscopy showed small internal hemorrhoids, otherwise normal -Last PRBC transfusion 12/13.  Hemoglobin has been stable in the low 8 g range.  Even though has dropped to 7.7, still relatively stable.  Continue  to follow daily CBC.  Chronic Anemia: sickle cell and CKD.  Management as above.  Thrombocytopenia Resolved.   HX COPD:  - takes incruse and ellipta at home.  No clinical bronchospasm. -Continue brovana, yupelri, and pulmicort -Pulm toilet as able -Not hypoxic.   Chronic combined  systolic and diastolic CHF:  - echo 9983 EF of 40 to 45% with mild right ventricular dysfunction and severe biatrial enlargement and severe mitral regurg and tricuspid regurg -Volume management across HD.  Does not appear overtly volume overloaded.  Persistent Afib/atrial flutter with RVR:  - on eliquis at home, currently on hold due to postop ileus and upper GI bleed. - rate controlled  Pulm HTN -Heart Failure following -Holding home Hydralazine, Imdur, Torsemide given borderline hypotension    Sickle cell disease Plan: -No sickle cell related pain.  PCA discontinued.  On multimodality pain control.   Protein calorie malnutrition Plan: -Continue TPN  HTN: -Controlled.    Body mass index is 21.19 kg/m.  Nutritional Status Nutrition Problem: Severe Malnutrition Etiology: acute illness (SBO) Signs/Symptoms: moderate fat depletion, moderate muscle depletion, percent weight loss (8% weight loss within 3 months) Percent weight loss: 8 % Interventions: TPN  Pressure Ulcer:     DVT prophylaxis: SCDs Start: 05/19/21 2116     Code Status: Full Code Family Communication: None at bedside. Disposition:  Status is: Inpatient  Remains inpatient appropriate because: Severity of illness including postop ileus, NG tube, TNA,        Consultants:   PCCM General surgery [facility] AHF team  Procedures:   As above.  Antimicrobials:   As above.  Subjective:  Reports having a BM yesterday and 1 early this morning.  Tolerating clear liquids without nausea or vomiting.  Still having abdominal pain but mostly with activity.  States that was up in chair briefly yesterday.  No dyspnea or chest pain.  Objective:   Vitals:   06/02/21 1915 06/02/21 1935 06/02/21 2352 06/03/21 0323  BP:  118/69 106/63 118/63  Pulse:  87 80 87  Resp:  15 15 17   Temp:  98.3 F (36.8 C) 98.2 F (36.8 C) 98 F (36.7 C)  TempSrc:  Oral Oral Oral  SpO2: 100% 100% 100% 100%   Weight:    68.9 kg  Height:        General exam: Young male, moderately built and nourished lying comfortably supine in bed, no distress noted. Respiratory system: Clear to auscultation.  No increased work of breathing.  Right upper anterior chest Port-A-Cath. Cardiovascular system: S1 & S2 heard, irregularly irregular. No JVD, murmurs, rubs, gallops or clicks. No pedal edema. Gastrointestinal system: Abdomen mildly distended but soft and nontender.  Surgical site dressings clean and dry.  Normal bowel sounds heard today. Central nervous system: Alert and oriented. No focal neurological deficits. Extremities: Symmetric 5 x 5 power. Skin: No rashes, lesions or ulcers Psychiatry: Judgement and insight appear normal. Mood & affect flat.     Data Reviewed:   I have personally reviewed following labs and imaging studies   CBC: Recent Labs  Lab 05/28/21 2031 05/29/21 0340 05/31/21 0238 05/31/21 1518 06/01/21 1755 06/02/21 0644 06/03/21 0335  WBC 13.0*   < > 14.4*   < > 11.1* 11.1* 10.7*  NEUTROABS 10.3*  --  11.4*  --   --   --   --   HGB 6.6*   < > 7.9*   < > 8.0* 8.2* 7.7*  HCT 18.7*   < >  22.5*   < > 24.2* 24.2* 23.4*  MCV 90.8   < > 86.9   < > 98.8 92.4 95.9  PLT 93*   < > 132*   < > 171 198 224   < > = values in this interval not displayed.    Basic Metabolic Panel: Recent Labs  Lab 05/29/21 0340 05/30/21 0427 05/30/21 1205 05/30/21 1914 05/31/21 0238 06/01/21 0517 06/03/21 0500  NA 137 135 138  --  134* 137 137  K 3.6 2.7* 3.4*  --  3.4* 3.5 2.9*  CL 103 99 100  --  100 101 111  CO2 25 28 26   --  26 25 16*  GLUCOSE 111* 118* 111*  --  105* 108* 90  BUN 65* 24* 33*  --  47* 43* 73*  CREATININE 5.72* 2.72* 3.18*  --  4.24* 3.40* 4.54*  CALCIUM 8.3* 8.2* 8.7*  --  8.3* 8.6* 7.2*  MG 2.2 1.9  --   --  2.3 2.0 1.7  PHOS 3.1 1.5*  --  3.2 3.7 2.6 3.6    Liver Function Tests: Recent Labs  Lab 05/28/21 2031 05/29/21 0340 05/30/21 0427 05/31/21 0238  06/01/21 0517  AST 16 14* 18 19 23   ALT 5 6 9 9 13   ALKPHOS 88 79 99 110 161*  BILITOT 1.4* 1.4* 1.3* 2.1* 1.6*  PROT 4.7* 4.6* 5.1* 4.8* 5.1*  ALBUMIN 1.6* 1.5* 1.7* 1.6* 1.8*    CBG: Recent Labs  Lab 06/02/21 2351 06/03/21 0319 06/03/21 0321  GLUCAP 111* >600* 104*    Microbiology Studies:   Recent Results (from the past 240 hour(s))  MRSA Next Gen by PCR, Nasal     Status: None   Collection Time: 05/24/21  5:54 PM   Specimen: Nasal Mucosa; Nasal Swab  Result Value Ref Range Status   MRSA by PCR Next Gen NOT DETECTED NOT DETECTED Final    Comment: (NOTE) The GeneXpert MRSA Assay (FDA approved for NASAL specimens only), is one component of a comprehensive MRSA colonization surveillance program. It is not intended to diagnose MRSA infection nor to guide or monitor treatment for MRSA infections. Test performance is not FDA approved in patients less than 65 years old. Performed at [facility], [place] [address], [place], [place] [address]     Radiology Studies:  No results found.  Scheduled Meds:    acetaminophen  1,000 mg Oral Q6H   arformoterol  15 mcg Nebulization BID   budesonide (PULMICORT) nebulizer solution  0.25 mg Nebulization BID   Chlorhexidine Gluconate Cloth  6 each Topical Daily   darbepoetin (ARANESP) injection - DIALYSIS  200 mcg Intravenous Q Wed-HD   mouth rinse  15 mL Mouth Rinse BID   methocarbamol  1,000 mg Oral TID   pantoprazole  40 mg Intravenous Q12H   revefenacin  175 mcg Nebulization Daily   sodium chloride flush  10-40 mL Intracatheter Q12H    Continuous Infusions:    potassium chloride 10 mEq (06/03/21 0849)   TPN ADULT (ION) 65 mL/hr at 06/02/21 1739   TPN ADULT (ION)       LOS: 15 days     Vernell Leep, MD,  FACP, [facility], [facility], [facility] (Care Management Physician Certified) [place]  To contact the attending provider between 7A-7P or the covering provider during after hours  7P-7A, please log into the web site [email or web address] and access using universal [place] password for that web site. If  you do not have the password, please call the hospital operator.  06/03/2021, 9:51 AM

## 2021-06-03 NOTE — Progress Notes (Addendum)
Progress Note  5 Days Post-Op  Subjective: Passing flatus and having BMs.  Reports tolerating lots of liquids without n/v.  Getting up to commode.  Pain control improving.   Objective: Vital signs in last 24 hours: Temp:  [98 F (36.7 C)-98.6 F (37 C)] 98 F (36.7 C) (12/17 0323) Pulse Rate:  [80-87] 87 (12/17 0323) Resp:  [12-17] 17 (12/17 0323) BP: (106-124)/(63-72) 118/63 (12/17 0323) SpO2:  [100 %] 100 % (12/17 0323) Weight:  [68.9 kg] 68.9 kg (12/17 0323) Last BM Date: 05/22/21  Intake/Output from previous day: 12/16 0701 - 12/17 0700 In: 709.7 [I.V.:709.7] Out: 300 [Urine:300] Intake/Output this shift: No intake/output data recorded.  PE: General: NAD Abd: soft, appropriately tender. Not significantly distended. Midline wound is intact, but adipose tissue yellow and brown.  No drainage noted from JP site this morning.   Lab Results:  Recent Labs    06/02/21 0644 06/03/21 0335  WBC 11.1* 10.7*  HGB 8.2* 7.7*  HCT 24.2* 23.4*  PLT 198 224   BMET Recent Labs    06/01/21 0517 06/03/21 0500  NA 137 137  K 3.5 2.9*  CL 101 111  CO2 25 16*  GLUCOSE 108* 90  BUN 43* 73*  CREATININE 3.40* 4.54*  CALCIUM 8.6* 7.2*   PT/INR No results for input(s): LABPROT, INR in the last 72 hours.  CMP     Component Value Date/Time   NA 137 06/03/2021 0500   NA 135 05/24/2020 1119   K 2.9 (L) 06/03/2021 0500   CL 111 06/03/2021 0500   CO2 16 (L) 06/03/2021 0500   GLUCOSE 90 06/03/2021 0500   BUN 73 (H) 06/03/2021 0500   BUN 25 05/24/2020 1119   CREATININE 4.54 (H) 06/03/2021 0500   CREATININE 2.90 (H) 08/11/2015 1359   CALCIUM 7.2 (L) 06/03/2021 0500   PROT 5.1 (L) 06/01/2021 0517   PROT 7.5 05/24/2020 1119   ALBUMIN 1.8 (L) 06/01/2021 0517   ALBUMIN 4.1 05/24/2020 1119   AST 23 06/01/2021 0517   ALT 13 06/01/2021 0517   ALKPHOS 161 (H) 06/01/2021 0517   BILITOT 1.6 (H) 06/01/2021 0517   BILITOT 2.4 (H) 05/24/2020 1119   GFRNONAA 14 (L) 06/03/2021  0500   GFRNONAA 23 (L) 08/11/2015 1359   GFRAA 24 (L) 05/24/2020 1119   GFRAA 27 (L) 08/11/2015 1359   Lipase     Component Value Date/Time   LIPASE 51 05/19/2021 1445       Studies/Results: No results found.  Anti-infectives: Anti-infectives (From admission, onward)    Start     Dose/Rate Route Frequency Ordered Stop   05/24/21 1730  piperacillin-tazobactam (ZOSYN) IVPB 2.25 g  Status:  Discontinued        2.25 g 100 mL/hr over 30 Minutes Intravenous Every 8 hours 05/24/21 1654 05/29/21 1303   05/24/21 1615  metroNIDAZOLE (FLAGYL) IVPB 500 mg       Note to Pharmacy: Please tube to #[phone]    500 mg 100 mL/hr over 60 Minutes Intravenous  Once 05/24/21 1603 05/24/21 1630        Assessment/Plan POD 10, s/p dx lap converted to ex lap for SBR and cholecystectomy for gangrenous cholecystitis and chronic ischemic segment of SB causing an obstruction, Dr. Donne Hazel 05/24/21 -[facility] -BID dressing changes -prn dressing changes over JP drain site. -FLD, advance to soft as tolerated -mobilize/pulm toilet -TNA for nutritional support while has ileus -zosyn stopped 12/12 after 5 days post op completed -tramadol for oral  pain control (to minimize risk of nausea with oxy, but given renal function etc may switch to oxy if unable to get good pain relief with 50 q 12 hr), robaxin, tylenol, and prn dilaudid for severe or breakthrough pain.   FEN: FLD,TPN VTE: SCDs, DVT prophylaxis on hold til hgb stabilizes ID: [email or web address], completed 12/12  UGI bleed - secondary to AVM in the gastric body, s/p tx by GI 12/15.  Hgb stable ESRD - TTS, per renal CHF - per cards A. Fib on eliquis, on hold Acute blood loss Anemia - hgb 7.7 Acute hypoxic respiratory failure - extubated and doing well   LOS: 15 days    Ileana Roup, [facility] 06/03/2021, 7:53 AM

## 2021-06-04 LAB — CBC
HCT: 21.7 % — ABNORMAL LOW (ref 39.0–52.0)
Hemoglobin: 7.4 g/dL — ABNORMAL LOW (ref 13.0–17.0)
MCH: 31.2 pg (ref 26.0–34.0)
MCHC: 34.1 g/dL (ref 30.0–36.0)
MCV: 91.6 fL (ref 80.0–100.0)
Platelets: 209 10*3/uL (ref 150–400)
RBC: 2.37 MIL/uL — ABNORMAL LOW (ref 4.22–5.81)
RDW: 20.3 % — ABNORMAL HIGH (ref 11.5–15.5)
WBC: 12.2 10*3/uL — ABNORMAL HIGH (ref 4.0–10.5)
nRBC: 0.6 % — ABNORMAL HIGH (ref 0.0–0.2)

## 2021-06-04 LAB — PHOSPHORUS: Phosphorus: 3.5 mg/dL (ref 2.5–4.6)

## 2021-06-04 LAB — GLUCOSE, CAPILLARY
Glucose-Capillary: 114 mg/dL — ABNORMAL HIGH (ref 70–99)
Glucose-Capillary: 119 mg/dL — ABNORMAL HIGH (ref 70–99)
Glucose-Capillary: 118 mg/dL — ABNORMAL HIGH (ref 70–99)

## 2021-06-04 LAB — BASIC METABOLIC PANEL
Anion gap: 9 (ref 5–15)
BUN: 50 mg/dL — ABNORMAL HIGH (ref 8–23)
CO2: 24 mmol/L (ref 22–32)
Calcium: 8.4 mg/dL — ABNORMAL LOW (ref 8.9–10.3)
Chloride: 104 mmol/L (ref 98–111)
Creatinine, Ser: 3.71 mg/dL — ABNORMAL HIGH (ref 0.61–1.24)
GFR, Estimated: 18 mL/min — ABNORMAL LOW (ref >60)
Glucose, Bld: 105 mg/dL — ABNORMAL HIGH (ref 70–99)
Potassium: 3.9 mmol/L (ref 3.5–5.1)
Sodium: 137 mmol/L (ref 135–145)

## 2021-06-04 LAB — MAGNESIUM: Magnesium: 2.2 mg/dL (ref 1.7–2.4)

## 2021-06-04 MED ORDER — TRACE MINERALS CU-MN-SE-ZN 300-55-60-3000 MCG/ML IV SOLN
INTRAVENOUS | Status: AC
  Filled 2021-06-04: qty 780

## 2021-06-04 MED ORDER — HYDROMORPHONE HCL 1 MG/ML IJ SOLN
0.5000 mg | INTRAMUSCULAR | Status: DC | PRN
  Administered 2021-06-04 – 2021-06-06 (×8): 0.5 mg via INTRAVENOUS
  Filled 2021-06-04 (×8): qty 0.5

## 2021-06-04 MED ORDER — METHOCARBAMOL 500 MG PO TABS
1000.0000 mg | ORAL_TABLET | Freq: Four times a day (QID) | ORAL | Status: DC
  Administered 2021-06-04 – 2021-06-09 (×22): 1000 mg via ORAL
  Filled 2021-06-04 (×22): qty 2

## 2021-06-04 MED ORDER — POTASSIUM CHLORIDE 10 MEQ/100ML IV SOLN: 10.0000 meq | INTRAVENOUS | Status: DC

## 2021-06-04 MED ORDER — POTASSIUM CHLORIDE CRYS ER 20 MEQ PO TBCR: 40.0000 meq | EXTENDED_RELEASE_TABLET | Freq: Once | ORAL | Status: DC

## 2021-06-04 NOTE — Progress Notes (Addendum)
PHARMACY - TOTAL PARENTERAL NUTRITION CONSULT NOTE  Indication: Small bowel obstruction  Patient Measurements: Height: 5\' 11"  (180.3 cm) Weight: 66.8 kg (147 lb 4.3 oz) IBW/kg (Calculated) : 75.3 TPN AdjBW (KG): 69.9 Body mass index is 20.54 kg/m. Usual Weight: 154 lb 02/28/21  Assessment:  54 yom with ESRD admitted with worsening abdominal pain, found to have high-grade SBO. He had recent admission at [facility] for SBO resolved with conservative management; also had choledocholithiasis s/p ERCP with stone extraction, stent to CBD and PD. Lap chole recommended but declined. Surgery reports no flatus or BM except with enema on 12/5 and unlikely to resolve without surgical intervention. Given prolonged NPO status, pharmacy consulted for TPN for nutritional support. At risk for refeeding syndrome.  Glucose / Insulin: no hx DM,  CBGs remain <120. Off insulin   Electrolytes: Last HD 12/17 4K bath - K 3.9 (20 mEq IV, goal >/= 4 for Afib/ileus), Phos 3.5, Mag 2.2 (2g IV, goal >/= 2), Na 137 Renal: ESRD on HD TTS; Last HD 12/17 Hepatic: LFTs / TG WNL, tbili down 1.6, albumin 1.8 Intake / Output; MIVF: UOP not charted, net neg 2.5L via dialysis, LBM 12/17 GI Imaging:  12/3 Abd XR: Probable persistent SBO not well evaluated 12/4 CT A/P: diffusely dilated SB with decompressed terminal ileum c/w SBO 12/6 Abd XR: dilation of small-bowel loops suggesting ileus or pSBO 12/7 Abd XR: unchanged 12/11 Abx XR: increased in dilated bowel loops 12/12 CT: Dilated mid small bowel loops, small volume perihepatic ascites GI Surgeries / Procedures:  12/7 cholecystectomy with SBR 12/12 EGD: ulcerative esophagitis, bleeding AVM post clip/epi/APC  Central access: IJ placed by IR 05/23/21 TPN start date: 05/23/21  Nutritional Goals: RD Estimated Needs Total Energy Estimated Needs: 1900-2100 Total Protein Estimated Needs: 100-115 gm Total Fluid Estimated Needs: 1.9 L  Current Nutrition:  TPN NPO- NGT  Clamped - tolerated 12/15 without emesis 12/16 CLD  12/17 FLD- only ate bites of jello a few spoonfuls of broth, a lot of water "that just runs through me," no nutrition shakes. Per pt low appetite and abdominal pain when eating  Plan:  Cycle concentrated TPN over 18 hours (rate 46-92 mL/hr, GIR 2-4 mg/kg/hr) to help stimulate appetite. Will provide 117g AA, 273g CHO and 58g ILE for a total of 1974 kCal, meeting 100% of needs Electrolytes in TPN: Increase K 27 mEq/L (= 42 mEq/day), Cl:Ac 1:1; Continue Na 135 mEq/L,  Ca 2 mEq/L, Mag to 5 mEq/L, Phos 10 mmol/L    Add standard MVI and trace elements to TPN; remove chromium in patient on dialysis F/U TPN labs daily until stable at cyclic goal, then Mon/Thurs   F/u PO intake and ability to wean TPN   ADDENDUM:  Consult to wean TPN seen after TPN already made for the day. TPN set to cycle starting tonight. Will start weaning TPN 12/19.   Benetta Spar, PharmD, BCPS, BCCP Clinical Pharmacist  Please check AMION for all [place] phone numbers After 10:00 PM, call [place] [phone]

## 2021-06-04 NOTE — Progress Notes (Signed)
Progress Note  6 Days Post-Op  Subjective: Pt reports he is tolerating FLD without n/v. Having BMs, all loose. Abdominal pain, better with flatus and BMs. Still taking more IV pain meds, encouraged PO pain control.   Objective: Vital signs in last 24 hours: Temp:  [97.8 F (36.6 C)-98.9 F (37.2 C)] 98.5 F (36.9 C) (12/18 0257) Pulse Rate:  [66-92] 89 (12/18 0257) Resp:  [15-21] 18 (12/18 0257) BP: (114-136)/(63-81) 136/81 (12/18 0257) SpO2:  [99 %-100 %] 99 % (12/18 0819) Weight:  [66.8 kg-68 kg] 66.8 kg (12/18 0524) Last BM Date: 05/22/21  Intake/Output from previous day: 12/17 0701 - 12/18 0700 In: 50 [P.O.:50] Out: 2500  Intake/Output this shift: No intake/output data recorded.  PE: General: NAD Abd: soft, appropriately tender. Not significantly distended. Midline wound is intact, but adipose tissue yellow and brown.  No drainage noted from JP site this morning.   Lab Results:  Recent Labs    06/03/21 0335 06/04/21 0619  WBC 10.7* 12.2*  HGB 7.7* 7.4*  HCT 23.4* 21.7*  PLT 224 209   BMET Recent Labs    06/03/21 0500 06/04/21 0619  NA 137 137  K 2.9* 3.9  CL 111 104  CO2 16* 24  GLUCOSE 90 105*  BUN 73* 50*  CREATININE 4.54* 3.71*  CALCIUM 7.2* 8.4*   PT/INR No results for input(s): LABPROT, INR in the last 72 hours. CMP     Component Value Date/Time   NA 137 06/04/2021 0619   NA 135 05/24/2020 1119   K 3.9 06/04/2021 0619   CL 104 06/04/2021 0619   CO2 24 06/04/2021 0619   GLUCOSE 105 (H) 06/04/2021 0619   BUN 50 (H) 06/04/2021 0619   BUN 25 05/24/2020 1119   CREATININE 3.71 (H) 06/04/2021 0619   CREATININE 2.90 (H) 08/11/2015 1359   CALCIUM 8.4 (L) 06/04/2021 0619   PROT 5.1 (L) 06/01/2021 0517   PROT 7.5 05/24/2020 1119   ALBUMIN 1.8 (L) 06/01/2021 0517   ALBUMIN 4.1 05/24/2020 1119   AST 23 06/01/2021 0517   ALT 13 06/01/2021 0517   ALKPHOS 161 (H) 06/01/2021 0517   BILITOT 1.6 (H) 06/01/2021 0517   BILITOT 2.4 (H) 05/24/2020  1119   GFRNONAA 18 (L) 06/04/2021 0619   GFRNONAA 23 (L) 08/11/2015 1359   GFRAA 24 (L) 05/24/2020 1119   GFRAA 27 (L) 08/11/2015 1359   Lipase     Component Value Date/Time   LIPASE 51 05/19/2021 1445       Studies/Results: No results found.  Anti-infectives: Anti-infectives (From admission, onward)    Start     Dose/Rate Route Frequency Ordered Stop   05/24/21 1730  piperacillin-tazobactam (ZOSYN) IVPB 2.25 g  Status:  Discontinued        2.25 g 100 mL/hr over 30 Minutes Intravenous Every 8 hours 05/24/21 1654 05/29/21 1303   05/24/21 1615  metroNIDAZOLE (FLAGYL) IVPB 500 mg       Note to Pharmacy: Please tube to #[phone]    500 mg 100 mL/hr over 60 Minutes Intravenous  Once 05/24/21 1603 05/24/21 1630        Assessment/Plan POD 11, s/p dx lap converted to ex lap for SBR and cholecystectomy for gangrenous cholecystitis and chronic ischemic segment of SB causing an obstruction, Dr. Donne Hazel 05/24/21 [facility] - BID dressing changes - prn dressing changes over JP drain site. - soft diet  - mobilize/pulm toilet - start to wean TPN - zosyn stopped 12/12 after  5 days post op completed - tramadol for oral pain control (to minimize risk of nausea with oxy, but given renal function etc may switch to oxy if unable to get good pain relief with 50 q 12 hr), robaxin, tylenol, and prn dilaudid for breakthrough pain.   FEN: soft diet, wean TPN VTE: SCDs, DVT prophylaxis on hold til hgb stabilizes ID: [email or web address], completed 12/12   UGI bleed - secondary to AVM in the gastric body, s/p tx by GI 12/15.  Hgb stable ESRD - TTS, per renal CHF - per cards A. Fib on eliquis, on hold Acute blood loss Anemia - hgb 7.4 Acute hypoxic respiratory failure - extubated and doing well  LOS: 16 days    Norm Parcel, [facility] 06/04/2021, 10:28 AM Please see Amion for pager number during day hours 7:00am-4:30pm

## 2021-06-04 NOTE — Progress Notes (Signed)
PROGRESS NOTE   Patrick [name]  TDD:[id number]    DOB: July 18, 1955    DOA: 05/19/2021  PCP: Dorena Dew, FNP   I have briefly reviewed patients previous medical records in [facility]  Chief Complaint  Patient presents with   Small Bowel Obstruction    Brief Narrative:  65 year old male with medical history significant for ESRD on TTS HD, chronic systolic CHF, COPD, pulmonary hypertension, sickle cell anemia, chronic A. fib on Eliquis, SBO, choledocholithiasis, presented to [facility] on 05/19/2021 with recurrent SBO and choledocholithiasis.  On 05/09/2021, patient was hospitalized at [facility] with SBO and choledocholithiasis.  SBO resolved with conservative measures and choledocholithiasis improved with ERCP and stent placement.    12/7: Underwent exploratory laparotomy, surgical resection of SBO and cholecystectomy.  Postprocedure, patient remained on vasopressors and intubated.  Extubated 12/8 and transferred to [facility] on 12/9.  12/11, noted persistent bloody drainage from NGT, general surgery and [facility] on board, s/p DDAVP and Protonix infusion.  12/12: EGD showed significant amount of blood in the stomach, lavaged and AVM bleeding, treated endoscopically.  Care transferred back from PCCM to [facility] on 12/14.  Course complicated by prolonged postop ileus, finally resolving.   Assessment & Plan:  Principal Problem:   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 05/24/2021 Active Problems:   Abdominal pain   Nausea & vomiting   Anemia   End stage renal disease (HCC)   Permanent atrial fibrillation (HCC)   Chronic systolic CHF (congestive heart failure) (HCC)   Upper GI bleed   SBO (small bowel obstruction) (HCC)   Protein-calorie malnutrition, severe   ABLA (acute blood loss anemia)   Empyema of gallbladder   Acute gangrenous cholecystitis s/p open cholcystectomy 05/24/2021    SBO due to chronic ischemic segment of SB, postop ileus: 12/7: S/p diagnostic  laparoscopy converted to exploratory laparotomies for small bowel resection and cholecystectomy for gangrenous cholecystitis and chronic ischemic segment of SB causing an obstruction.  General surgery continues to follow.  Twice daily dressing changes.  Prolonged postop ileus.  TNA for nutritional support.  Completed 5 days of Zosyn 12/12.  Course complicated by prolonged postop ileus.  General surgery managing.  DC'd PCA and JP drain.  NGT discontinued.  Started clear liquids.  Having daily BM for the last 2 days.  Tolerated full liquid diet.  Ongoing issues with abdominal pain limiting mobilization. WBC also up.  Defer reimaging decision to [facility] follow-up.  Remains on multimodality pain control (schedule Tylenol, as needed Oxy IR/tramadol and as needed IV Dilaudid).  Gangrenous cholecystitis: 12/7 cholecystectomy; recent Choledocholithiasis s/p recent ERCP stent placement at [facility]  Acute upper GI bleed S/p DDAVP 12/11.  [facility] on board.  S/p EGD 12/12 showed significant amount of blood in the stomach, lavaged and AVM bleeding in the stomach.  Unclear if due to trauma from NG causing this.  Treated endoscopically.  NG still draining dark liquid.  IV PPI fusion transition to PPI twice daily.  Eliquis on hold.  S/p PRBC transfusion 12/13.  Hemoglobin gradually drifting down (8.2 > 7.7 > 7.4), which may be due to lab draws and multifactorial rather than active GI bleed.  Upper GI bleed seems to have clinically resolved.  ESRD: on HD tues, thurs, sat Nephrology following.  S/p HD 12/17.  Hypokalemia Replaced.  Magnesium 2.2.   Acute Blood Loss Anemia  >> AVM GI Bleed per EGD Unsure cause - Prior EGD in 2019 showed nonbleeding gastritis,  Colonoscopy showed small internal hemorrhoids, otherwise normal -Last PRBC transfusion 12/13.    - Hemoglobin gradually drifting down (8.2 > 7.7 > 7.4), which may be due to lab draws and multifactorial rather than active GI bleed.    - Continue to follow  daily CBC.  Transfuse if hemoglobin 7 g or lower.  Chronic Anemia: sickle cell and CKD.  Management as above.  Thrombocytopenia Resolved.   HX COPD:  - takes incruse and ellipta at home.  No clinical bronchospasm. -Continue brovana, yupelri, and pulmicort -Pulm toilet as able -Not hypoxic.   Chronic combined systolic and diastolic CHF:  - echo 9983 EF of 40 to 45% with mild right ventricular dysfunction and severe biatrial enlargement and severe mitral regurg and tricuspid regurg -Volume management across HD.  Does not appear overtly volume overloaded.  Persistent Afib/atrial flutter with RVR:  - on eliquis at home, currently on hold due to postop ileus and upper GI bleed. - rate controlled  Pulm HTN -Heart Failure following -Holding home Hydralazine, Imdur, Torsemide given borderline hypotension    Sickle cell disease Plan: -No sickle cell related pain.  PCA discontinued.  On multimodality pain control.   Protein calorie malnutrition Plan: -Continue TPN  HTN: -Controlled.    Body mass index is 20.54 kg/m.  Nutritional Status Nutrition Problem: Severe Malnutrition Etiology: acute illness (SBO) Signs/Symptoms: moderate fat depletion, moderate muscle depletion, percent weight loss (8% weight loss within 3 months) Percent weight loss: 8 % Interventions: TPN  Pressure Ulcer:     DVT prophylaxis: SCDs Start: 05/19/21 2116     Code Status: Full Code Family Communication: None at bedside. Disposition:  Status is: Inpatient  Remains inpatient appropriate because: Severity of illness including postop ileus, TNA, still working on advancing diet.        Consultants:   PCCM General surgery [facility] Nephrology AHF team  Procedures:   As above.  Antimicrobials:   As above.  Subjective:  Had 2 BMs this morning.  Tolerated liquid diet without nausea or vomiting.  Ongoing abdominal pain and states that he did not mobilize yesterday due to  pain.  Objective:   Vitals:   06/03/21 2323 06/04/21 0257 06/04/21 0524 06/04/21 0819  BP: 114/69 136/81    Pulse: 82 89    Resp: 16 18    Temp: 98.9 F (37.2 C) 98.5 F (36.9 C)    TempSrc: Oral Oral    SpO2: 100% 100%  99%  Weight:   66.8 kg   Height:        General exam: Young male, moderately built and nourished lying in right lateral position with the pillow on his abdomen for comfort, in some painful distress. Respiratory system: Clear to auscultation.  No increased work of breathing. Right upper anterior chest Port-A-Cath. Cardiovascular system: S1 & S2 heard, irregularly irregular. No JVD, murmurs, rubs, gallops or clicks. No pedal edema. Gastrointestinal system: Abdomen is mildly distended, surgical dressings clean and dry, nontender.  Normal bowel sounds heard. Central nervous system: Alert and oriented. No focal neurological deficits. Extremities: Symmetric 5 x 5 power. Skin: No rashes, lesions or ulcers Psychiatry: Judgement and insight appear normal. Mood & affect flat.     Data Reviewed:   I have personally reviewed following labs and imaging studies   CBC: Recent Labs  Lab 05/28/21 2031 05/29/21 0340 05/31/21 0238 05/31/21 1518 06/02/21 0644 06/03/21 0335 06/04/21 0619  WBC 13.0*   < > 14.4*   < > 11.1* 10.7* 12.2*  NEUTROABS 10.3*  --  11.4*  --   --   --   --   HGB 6.6*   < > 7.9*   < > 8.2* 7.7* 7.4*  HCT 18.7*   < > 22.5*   < > 24.2* 23.4* 21.7*  MCV 90.8   < > 86.9   < > 92.4 95.9 91.6  PLT 93*   < > 132*   < > 198 224 209   < > = values in this interval not displayed.    Basic Metabolic Panel: Recent Labs  Lab 05/30/21 0427 05/30/21 1205 05/30/21 1914 05/31/21 0238 06/01/21 0517 06/03/21 0500 06/04/21 0619  NA 135 138  --  134* 137 137 137  K 2.7* 3.4*  --  3.4* 3.5 2.9* 3.9  CL 99 100  --  100 101 111 104  CO2 28 26  --  26 25 16* 24  GLUCOSE 118* 111*  --  105* 108* 90 105*  BUN 24* 33*  --  47* 43* 73* 50*  CREATININE 2.72*  3.18*  --  4.24* 3.40* 4.54* 3.71*  CALCIUM 8.2* 8.7*  --  8.3* 8.6* 7.2* 8.4*  MG 1.9  --   --  2.3 2.0 1.7 2.2  PHOS 1.5*  --  3.2 3.7 2.6 3.6 3.5    Liver Function Tests: Recent Labs  Lab 05/28/21 2031 05/29/21 0340 05/30/21 0427 05/31/21 0238 06/01/21 0517  AST 16 14* 18 19 23   ALT 5 6 9 9 13   ALKPHOS 88 79 99 110 161*  BILITOT 1.4* 1.4* 1.3* 2.1* 1.6*  PROT 4.7* 4.6* 5.1* 4.8* 5.1*  ALBUMIN 1.6* 1.5* 1.7* 1.6* 1.8*    CBG: Recent Labs  Lab 06/03/21 0319 06/03/21 0321 06/04/21 0531  GLUCAP >600* 104* 114*    Microbiology Studies:   No results found for this or any previous visit (from the past 240 hour(s)).   Radiology Studies:  No results found.  Scheduled Meds:    acetaminophen  1,000 mg Oral Q6H   arformoterol  15 mcg Nebulization BID   budesonide (PULMICORT) nebulizer solution  0.25 mg Nebulization BID   Chlorhexidine Gluconate Cloth  6 each Topical Daily   darbepoetin (ARANESP) injection - DIALYSIS  200 mcg Intravenous Q Wed-HD   mouth rinse  15 mL Mouth Rinse BID   methocarbamol  1,000 mg Oral QID   pantoprazole  40 mg Intravenous Q12H   revefenacin  175 mcg Nebulization Daily   sodium chloride flush  10-40 mL Intracatheter Q12H    Continuous Infusions:    TPN ADULT (ION) 65 mL/hr at 32/35/57 3220   TPN CYCLIC-ADULT (ION)       LOS: 16 days     Vernell Leep, MD,  FACP, [facility], [facility], [facility] (Care Management Physician Certified) [facility]  To contact the attending provider between 7A-7P or the covering provider during after hours 7P-7A, please log into the web site [email or web address] and access using universal [place] password for that web site. If you do not have the password, please call the hospital operator.  06/04/2021, 10:12 AM

## 2021-06-04 NOTE — Plan of Care (Signed)
  Problem: Education: Goal: Knowledge of General Education information will improve Description: Including pain rating scale, medication(s)/side effects and non-pharmacologic comfort measures Outcome: Progressing   Problem: Activity: Goal: Risk for activity intolerance will decrease Outcome: Progressing   Problem: Nutrition: Goal: Adequate nutrition will be maintained Outcome: Progressing   

## 2021-06-04 NOTE — Progress Notes (Signed)
Subjective:   seen in room, no new c/o  Objective Vital signs in last 24 hours: Vitals:   06/03/21 2323 06/04/21 0257 06/04/21 0524 06/04/21 0819  BP: 114/69 136/81    Pulse: 82 89    Resp: 16 18    Temp: 98.9 F (37.2 C) 98.5 F (36.9 C)    TempSrc: Oral Oral    SpO2: 100% 100%  99%  Weight:   66.8 kg   Height:       Weight change: 1.5 kg  Physical Exam: General: adult male, no distress, NG out Heart: RRR no MRG noted Lungs: coarse BS b/l Abdomen: incisions bandaged Extremities: No pedal edema  dialysis Access: RUA aVF+ bruit  OP HD: TTS AF   4h  400/500   65kg  2/2 bath  P2  AVF  Hep none - Sensipar 30mg  PO q HD - Venofer 50mg  IV weekly - Mircera 222mcg IV q 2 weeks (last 11/8)   Assessment/ Plan SBO/ sp ex lap w/ cholecystectomy and SB resection - on 12/07, done for gangrenous GB and chronic SB ischemia. +has had 2 BM's, NG now out and advancing diet. Remains on TPN. Per gen surgery.  SP recent gallstone with CBD &pancreatic duct stenting UGIB - d/t AVM in stomach, rx'd by GI w/ APC/ endoclips/ epi sq on 12/12  ESRD: usual HD TTS. Next HD Tuesday. HTN/volume: BP stable, 2.5 L off on HD, wt's stable today at 67kg, +2kg.  Anemia CKD/ sickle cell/ abl-  Aranesp with HD weekly on Wed, last 12/14.  Tsat 34%, no Fe needed. Hb 8's now, sp 9u prbc's total here.  Hypokalemia - up to 3.5 yesterday  Metabolic bone disease: Ca/Phos ok - resume binders when eating  Nutrition: on TPN, NG out, advancing diet  A-fib: per cardiology   Kelly Splinter, MD 05/31/2021, 9:20 AM      Labs: Basic Metabolic Panel: Recent Labs  Lab 06/01/21 0517 06/03/21 0500 06/04/21 0619  NA 137 137 137  K 3.5 2.9* 3.9  CL 101 111 104  CO2 25 16* 24  GLUCOSE 108* 90 105*  BUN 43* 73* 50*  CREATININE 3.40* 4.54* 3.71*  CALCIUM 8.6* 7.2* 8.4*  PHOS 2.6 3.6 3.5    CBC: Recent Labs  Lab 05/28/21 2031 05/29/21 0340 05/31/21 0238 05/31/21 1518 06/01/21 0517 06/01/21 1755 06/02/21 0644  06/03/21 0335 06/04/21 0619  WBC 13.0*   < > 14.4*   < > 13.0* 11.1* 11.1* 10.7* 12.2*  NEUTROABS 10.3*  --  11.4*  --   --   --   --   --   --   HGB 6.6*   < > 7.9*   < > 7.7* 8.0* 8.2* 7.7* 7.4*  HCT 18.7*   < > 22.5*   < > 22.8* 24.2* 24.2* 23.4* 21.7*  MCV 90.8   < > 86.9   < > 89.1 98.8 92.4 95.9 91.6  PLT 93*   < > 132*   < > 164 171 198 224 209   < > = values in this interval not displayed.     Medications:  TPN ADULT (ION) 65 mL/hr at 06/03/21 1721    acetaminophen  1,000 mg Oral Q6H   arformoterol  15 mcg Nebulization BID   budesonide (PULMICORT) nebulizer solution  0.25 mg Nebulization BID   Chlorhexidine Gluconate Cloth  6 each Topical Daily   darbepoetin (ARANESP) injection - DIALYSIS  200 mcg Intravenous Q Wed-HD   mouth rinse  15 mL Mouth Rinse BID   methocarbamol  1,000 mg Oral TID   pantoprazole  40 mg Intravenous Q12H   revefenacin  175 mcg Nebulization Daily   sodium chloride flush  10-40 mL Intracatheter Q12H

## 2021-06-04 NOTE — Progress Notes (Signed)
Physical Therapy Treatment Patient Details Name: Patrick [name] MRN: [id number] DOB: 1956/04/10 Today's Date: 06/04/2021   History of Present Illness 65 y.o. male admitted 12/2 with SBO. Pt recently hospitalized 11/22-11/26 at [facility] in [place] for same reason s/p ERCP. 12/7 pt to OR for small bowel resection and open cholecystectomy without closure and remained on vent, extubated 12/8. 12/12: EGD showed significant amount of blood in the stomach, lavaged and AVM bleeding, treated endoscopically. PMHx: ESRD  on HD, anemia of CKD, CHF, Afib    PT Comments    Pt remains limited by pain and fatigue, requiring encouragement to participate in PT services. Pt ambulates with UE support of IV pole, fatiguing on return to room and often utilizing UE support of railing also. PT encourages pt to ambulate multiple times daily to aide in improving strength and activity tolerance, however the pt seems to remain reluctant to do so. PT continues to recommend SNF placement due to poor activity tolerance. Pt will need to function independently at a household level to safely discharge home.   Recommendations for follow up therapy are one component of a multi-disciplinary discharge planning process, led by the attending physician.  Recommendations may be updated based on patient status, additional functional criteria and insurance authorization.  Follow Up Recommendations  Skilled nursing-short term rehab (<3 hours/day)     Assistance Recommended at Discharge Intermittent Supervision/Assistance  Equipment Recommendations  BSC/3in1;Rolling walker (2 wheels)    Recommendations for Other Services       Precautions / Restrictions Precautions Precautions: Fall;Other (comment) Precaution Comments: sight impaired Restrictions Weight Bearing Restrictions: No     Mobility  Bed Mobility Overal bed mobility: Modified Independent Bed Mobility: Supine to Sit;Sit to Supine;Rolling Rolling: Modified  independent (Device/Increase time)   Supine to sit: Modified independent (Device/Increase time) Sit to supine: Modified independent (Device/Increase time)        Transfers Overall transfer level: Needs assistance Equipment used: None Transfers: Sit to/from Stand Sit to Stand: Supervision                Ambulation/Gait Ambulation/Gait assistance: Min guard Gait Distance (Feet): 150 Feet Assistive device: IV Pole (PRN use of railing when returning to room) Gait Pattern/deviations: Step-through pattern Gait velocity: reduced Gait velocity interpretation: <1.8 ft/sec, indicate of risk for recurrent falls   General Gait Details: pt with slowed step-through gait, increased trunk flexion with return to room   Stairs             Wheelchair Mobility    Modified Rankin (Stroke Patients Only)       Balance Overall balance assessment: Needs assistance Sitting-balance support: No upper extremity supported;Feet supported Sitting balance-Leahy Scale: Good     Standing balance support: No upper extremity supported Standing balance-Leahy Scale: Fair                              Cognition Arousal/Alertness: Awake/alert Behavior During Therapy: WFL for tasks assessed/performed Overall Cognitive Status: Within Functional Limits for tasks assessed                                          Exercises      General Comments General comments (skin integrity, edema, etc.): HR in 90s, afib noted on monitor      Pertinent Vitals/Pain Pain Assessment: Faces Faces Pain Scale:  Hurts even more Pain Location: abdomen Pain Descriptors / Indicators: Grimacing Pain Intervention(s): Monitored during session    Home Living                          Prior Function            PT Goals (current goals can now be found in the care plan section) Acute Rehab PT Goals Patient Stated Goal: to get to his home PT Goal Formulation: With  patient Time For Goal Achievement: 06/18/21 Potential to Achieve Goals: Fair Progress towards PT goals: Progressing toward goals (slowly)    Frequency    Min 3X/week      PT Plan Current plan remains appropriate    Co-evaluation              AM-PAC PT "6 Clicks" Mobility   Outcome Measure  Help needed turning from your back to your side while in a flat bed without using bedrails?: None Help needed moving from lying on your back to sitting on the side of a flat bed without using bedrails?: None Help needed moving to and from a bed to a chair (including a wheelchair)?: A Little Help needed standing up from a chair using your arms (e.g., wheelchair or bedside chair)?: A Little Help needed to walk in hospital room?: A Little Help needed climbing 3-5 steps with a railing? : Total 6 Click Score: 18    End of Session   Activity Tolerance: Patient limited by fatigue Patient left: in bed;with call bell/phone within reach Nurse Communication: Mobility status PT Visit Diagnosis: Muscle weakness (generalized) (M62.81);Difficulty in walking, not elsewhere classified (R26.2);Other abnormalities of gait and mobility (R26.89)     Time: 0600-4599 PT Time Calculation (min) (ACUTE ONLY): 15 min  Charges:  $Gait Training: 8-22 mins                     Zenaida Niece, PT, DPT Acute Rehabilitation Pager: [phone] Office [place] Tal[place] Poser 06/04/2021, 2:48 PM

## 2021-06-05 DIAGNOSIS — K559 Vascular disorder of intestine, unspecified: Secondary | ICD-10-CM | POA: Diagnosis not present

## 2021-06-05 LAB — PHOSPHORUS: Phosphorus: 4.4 mg/dL (ref 2.5–4.6)

## 2021-06-05 LAB — COMPREHENSIVE METABOLIC PANEL
ALT: 16 U/L (ref 0–44)
AST: 20 U/L (ref 15–41)
Albumin: 1.7 g/dL — ABNORMAL LOW (ref 3.5–5.0)
Alkaline Phosphatase: 263 U/L — ABNORMAL HIGH (ref 38–126)
Anion gap: 10 (ref 5–15)
BUN: 71 mg/dL — ABNORMAL HIGH (ref 8–23)
CO2: 20 mmol/L — ABNORMAL LOW (ref 22–32)
Calcium: 8.6 mg/dL — ABNORMAL LOW (ref 8.9–10.3)
Chloride: 104 mmol/L (ref 98–111)
Creatinine, Ser: 4.6 mg/dL — ABNORMAL HIGH (ref 0.61–1.24)
GFR, Estimated: 14 mL/min — ABNORMAL LOW (ref >60)
Glucose, Bld: 96 mg/dL (ref 70–99)
Potassium: 4 mmol/L (ref 3.5–5.1)
Sodium: 134 mmol/L — ABNORMAL LOW (ref 135–145)
Total Bilirubin: 1.5 mg/dL — ABNORMAL HIGH (ref 0.3–1.2)
Total Protein: 5.4 g/dL — ABNORMAL LOW (ref 6.5–8.1)

## 2021-06-05 LAB — CBC
HCT: 21.8 % — ABNORMAL LOW (ref 39.0–52.0)
Hemoglobin: 7.1 g/dL — ABNORMAL LOW (ref 13.0–17.0)
MCH: 29.7 pg (ref 26.0–34.0)
MCHC: 32.6 g/dL (ref 30.0–36.0)
MCV: 91.2 fL (ref 80.0–100.0)
Platelets: 224 10*3/uL (ref 150–400)
RBC: 2.39 MIL/uL — ABNORMAL LOW (ref 4.22–5.81)
RDW: 19.2 % — ABNORMAL HIGH (ref 11.5–15.5)
WBC: 12.4 10*3/uL — ABNORMAL HIGH (ref 4.0–10.5)
nRBC: 0.4 % — ABNORMAL HIGH (ref 0.0–0.2)

## 2021-06-05 LAB — GLUCOSE, CAPILLARY
Glucose-Capillary: >600 mg/dL (ref 70–99)
Glucose-Capillary: 93 mg/dL (ref 70–99)
Glucose-Capillary: 105 mg/dL — ABNORMAL HIGH (ref 70–99)

## 2021-06-05 LAB — TRIGLYCERIDES: Triglycerides: 42 mg/dL (ref <150)

## 2021-06-05 LAB — MAGNESIUM: Magnesium: 2.2 mg/dL (ref 1.7–2.4)

## 2021-06-05 MED ORDER — DARBEPOETIN ALFA 200 MCG/0.4ML IJ SOSY
200.0000 ug | PREFILLED_SYRINGE | INTRAMUSCULAR | Status: DC
  Administered 2021-06-06: 200 ug via INTRAVENOUS
  Filled 2021-06-05: qty 0.4

## 2021-06-05 MED ORDER — TRACE MINERALS CU-MN-SE-ZN 300-55-60-3000 MCG/ML IV SOLN
INTRAVENOUS | Status: AC
  Filled 2021-06-05: qty 390

## 2021-06-05 NOTE — Plan of Care (Signed)

## 2021-06-05 NOTE — Progress Notes (Signed)
Occupational Therapy Treatment Patient Details Name: Patrick [name] MRN: [id number] DOB: 1955-11-04 Today's Date: 06/05/2021   History of present illness 65 y.o. male admitted 12/2 with SBO. Pt recently hospitalized 11/22-11/26 at [facility] in [place] for same reason s/p ERCP. 12/7 pt to OR for small bowel resection and open cholecystectomy without closure and remained on vent, extubated 12/8. 12/12: EGD showed significant amount of blood in the stomach, lavaged and AVM bleeding, treated endoscopically. PMHx: ESRD  on HD, anemia of CKD, CHF, Afib   OT comments  Pt received on BSC. After Setup of items needed for toileting, pt able to complete remainder of task and transfer back to bed without assist, including line mgmt. Pt reports he has been transferring to/from [facility] during this admission. Pt continues to endorse high pain levels despite premedication though able to physically move well. Collaborated with pt on need to progress ADL endurance to facilitate progress and safe DC home. If pt's cousin able to stay with pt initially at DC to assist with IADLs, anticipate pt would do well with ADLs/mobility at home. However, noted pt currently requiring 2x/day dressing changes - would either need family training for dressing changes at home due to pt low vision or may require SNF for nursing wound care pending recommendations for incision care at discharge. Will continue to follow acutely and monitor DC needs.   Recommendations for follow up therapy are one component of a multi-disciplinary discharge planning process, led by the attending physician.  Recommendations may be updated based on patient status, additional functional criteria and insurance authorization.    Follow Up Recommendations  Home health OT (pending dressing recommendations, may need SNF for wound care if family not able to assist)    Assistance Recommended at Discharge Intermittent Supervision/Assistance   Equipment Recommendations  BSC/3in1    Recommendations for Other Services      Precautions / Restrictions Precautions Precautions: Fall;Other (comment) Precaution Comments: low vision; abdominal precautions for comfort Restrictions Weight Bearing Restrictions: No       Mobility Bed Mobility Overal bed mobility: Modified Independent Bed Mobility: Sit to Supine                Transfers Overall transfer level: Independent Equipment used: None Transfers: Sit to/from Stand;Bed to chair/wheelchair/BSC Sit to Stand: Independent Stand pivot transfers: Independent         General transfer comment: received on BSC, able to stand and pivot back to bed without device for safety issues, able to manage lines     Balance Overall balance assessment: Needs assistance Sitting-balance support: No upper extremity supported;Feet supported Sitting balance-Leahy Scale: Good     Standing balance support: No upper extremity supported Standing balance-Leahy Scale: Good                             ADL either performed or assessed with clinical judgement   ADL Overall ADL's : Needs assistance/impaired Eating/Feeding: Set up;Sitting                       Toilet Transfer: English as a second language teacher Details (indicate cue type and reason): received on [facility]; endorses being able to disconnect tele leads for transfer without assist. No assist needed on return back to bed with OT present and able to reconnect tele leads Toileting- Clothing Manipulation and Hygiene: Set up;Sitting/lateral lean Toileting - Clothing Manipulation Details (indicate cue type and reason): provided warm washcloth with  pt able to complete hygiene without assist       General ADL Comments: Pt remains limited by reported high levels of pain; reports he has been transferring to [facility] and completing toileting task without assist and was able to demo these tasks today with OT  present. Collaborated on DC plan, reports his cousin may stay with him to assist for a little while; discussed wound care mgmt with pt and RN - current changes at 2x/day and unsure if this will be the protocol at home. Encouraged family education of wound mgmtm prior to DC. If wound changes 2x/day would be difficult to manage at home and may would need SNF for this pending family support at DC.    Extremity/Trunk Assessment Upper Extremity Assessment Upper Extremity Assessment: Generalized weakness   Lower Extremity Assessment Lower Extremity Assessment: Defer to PT evaluation        Vision   Vision Assessment?: No apparent visual deficits   Perception     Praxis      Cognition Arousal/Alertness: Awake/alert Behavior During Therapy: WFL for tasks assessed/performed Overall Cognitive Status: Within Functional Limits for tasks assessed                                            Exercises     Shoulder Instructions       General Comments      Pertinent Vitals/ Pain       Pain Assessment: Faces Faces Pain Scale: Hurts little more Breathing: normal Negative Vocalization: none Facial Expression: smiling or inexpressive Body Language: relaxed Consolability: no need to console PAINAD Score: 0 Pain Location: abdomen Pain Descriptors / Indicators: Grimacing Pain Intervention(s): Monitored during session;Limited activity within patient's tolerance;Premedicated before session  Home Living                                          Prior Functioning/Environment              Frequency  Min 2X/week        Progress Toward Goals  OT Goals(current goals can now be found in the care plan section)  Progress towards OT goals: Progressing toward goals  Acute Rehab OT Goals Patient Stated Goal: decrease pain OT Goal Formulation: With patient Time For Goal Achievement: 06/19/21 Potential to Achieve Goals: Good ADL Goals Pt Will  Perform Grooming: with set-up;standing Pt Will Perform Lower Body Dressing: with modified independence;sit to/from stand;with adaptive equipment Pt Will Transfer to Toilet: with set-up;ambulating Pt Will Perform Toileting - Clothing Manipulation and hygiene: with modified independence;sit to/from stand;sitting/lateral leans Additional ADL Goal #1: Pt to increase activity tolerance > 7 min during functional tasks with stable vital signs  Plan Discharge plan needs to be updated    Co-evaluation                 AM-PAC OT "6 Clicks" Daily Activity     Outcome Measure   Help from another person eating meals?: A Little Help from another person taking care of personal grooming?: A Little Help from another person toileting, which includes using toliet, bedpan, or urinal?: A Little Help from another person bathing (including washing, rinsing, drying)?: A Little Help from another person to put on and taking off regular upper body clothing?: A Little  Help from another person to put on and taking off regular lower body clothing?: A Little 6 Click Score: 18    End of Session    OT Visit Diagnosis: Other abnormalities of gait and mobility (R26.89);Pain Pain - part of body:  (abdomen)   Activity Tolerance Patient tolerated treatment well;Patient limited by pain   Patient Left in bed;with call bell/phone within reach   Nurse Communication Mobility status;Patient requests pain meds        Time: 1210-1224 OT Time Calculation (min): 14 min  Charges: OT General Charges $OT Visit: 1 Visit OT Treatments $Self Care/Home Management : 8-22 mins  Malachy Chamber, OTR/L Acute Rehab Services Office: [phone]   Layla Maw 06/05/2021, 12:56 PM

## 2021-06-05 NOTE — Progress Notes (Signed)
Progress Note  7 Days Post-Op  Subjective: Complains of pain whenever he gets up out of bed or has to move around, also worsened pain with eating, but denies nausea or vomiting, reports bowel function.  Objective: Vital signs in last 24 hours: Temp:  [98.3 F (36.8 C)-99.1 F (37.3 C)] 99.1 F (37.3 C) (12/19 0352) Pulse Rate:  [79-86] 86 (12/19 0727) Resp:  [13-19] 18 (12/19 0727) BP: (97-127)/(52-69) 118/66 (12/19 0352) SpO2:  [96 %-100 %] 99 % (12/19 0734) Weight:  [71.9 kg] 71.9 kg (12/19 0619) Last BM Date: 06/04/21  Intake/Output from previous day: 12/18 0701 - 12/19 0700 In: 2426.4 [I.V.:2426.4] Out: 50 [Urine:50] Intake/Output this shift: No intake/output data recorded.  PE: General: NAD Abd: soft, appropriately tender. Not significantly distended. Midline wound is intact, but adipose tissue yellow and brown.     Lab Results:  Recent Labs    06/04/21 0619 06/05/21 0350  WBC 12.2* 12.4*  HGB 7.4* 7.1*  HCT 21.7* 21.8*  PLT 209 224    BMET Recent Labs    06/04/21 0619 06/05/21 0350  NA 137 134*  K 3.9 4.0  CL 104 104  CO2 24 20*  GLUCOSE 105* 96  BUN 50* 71*  CREATININE 3.71* 4.60*  CALCIUM 8.4* 8.6*    PT/INR No results for input(s): LABPROT, INR in the last 72 hours. CMP     Component Value Date/Time   NA 134 (L) 06/05/2021 0350   NA 135 05/24/2020 1119   K 4.0 06/05/2021 0350   CL 104 06/05/2021 0350   CO2 20 (L) 06/05/2021 0350   GLUCOSE 96 06/05/2021 0350   BUN 71 (H) 06/05/2021 0350   BUN 25 05/24/2020 1119   CREATININE 4.60 (H) 06/05/2021 0350   CREATININE 2.90 (H) 08/11/2015 1359   CALCIUM 8.6 (L) 06/05/2021 0350   PROT 5.4 (L) 06/05/2021 0350   PROT 7.5 05/24/2020 1119   ALBUMIN 1.7 (L) 06/05/2021 0350   ALBUMIN 4.1 05/24/2020 1119   AST 20 06/05/2021 0350   ALT 16 06/05/2021 0350   ALKPHOS 263 (H) 06/05/2021 0350   BILITOT 1.5 (H) 06/05/2021 0350   BILITOT 2.4 (H) 05/24/2020 1119   GFRNONAA 14 (L) 06/05/2021 0350    GFRNONAA 23 (L) 08/11/2015 1359   GFRAA 24 (L) 05/24/2020 1119   GFRAA 27 (L) 08/11/2015 1359   Lipase     Component Value Date/Time   LIPASE 51 05/19/2021 1445       Studies/Results: No results found.  Anti-infectives: Anti-infectives (From admission, onward)    Start     Dose/Rate Route Frequency Ordered Stop   05/24/21 1730  piperacillin-tazobactam (ZOSYN) IVPB 2.25 g  Status:  Discontinued        2.25 g 100 mL/hr over 30 Minutes Intravenous Every 8 hours 05/24/21 1654 05/29/21 1303   05/24/21 1615  metroNIDAZOLE (FLAGYL) IVPB 500 mg       Note to Pharmacy: Please tube to #75    500 mg 100 mL/hr over 60 Minutes Intravenous  Once 05/24/21 1603 05/24/21 1630        Assessment/Plan POD 12, s/p dx lap converted to ex lap for SBR and cholecystectomy for gangrenous cholecystitis and chronic ischemic segment of SB causing an obstruction, Dr. Donne Hazel 05/24/21 [facility] - BID dressing changes - prn dressing changes over JP drain site. - soft diet /wean off TPN - mobilize/pulm toilet - zosyn stopped 12/12 after 5 days post op completed -Multimodal pain regimen, wean narcotics  as able   FEN: soft diet, wean TPN VTE: SCDs, DVT prophylaxis on hold til hgb stabilizes ID: [email or web address], completed 12/12   UGI bleed - secondary to AVM in the gastric body, s/p tx by GI 12/15.  Hgb stable ESRD - TTS, per renal CHF - per cards A. Fib on eliquis, on hold Acute blood loss Anemia - hgb stable 7.1 Acute hypoxic respiratory failure - extubated and doing well  LOS: 17 days    Clovis Riley, MD [facility] Surgery 06/05/2021, 8:12 AM Please see Amion for pager number during day hours 7:00am-4:30pm

## 2021-06-05 NOTE — Progress Notes (Signed)
OT Cancellation Note  Patient Details Name: Patrick [name] MRN: [id number] DOB: 1955-11-15   Cancelled Treatment:    Reason Eval/Treat Not Completed: Patient declined, no reason specified. Coordinated with RN for pain premedication. Pt reporting pain improving some but not interested in OOB activities at this time - requests OT return back "in an hour or two". Pt also reports doing his "own therapy" sitting EOB and completing BSC transfers. Reinforced that pt was doing much more than this at home Independently and staff would like to help pt reach this independent level again.   Will return back per pt request. Of note, this is 2nd consecutive refusal for OT services. A 3rd refusal would indicate OT sign off at acute level.   Layla Maw 06/05/2021, 10:16 AM

## 2021-06-05 NOTE — Progress Notes (Signed)
Mobility Specialist Progress Note    06/05/21 1619  Mobility  Activity Refused mobility   Pt c/o stomach pain and stated mobilizing could make it worse even if it was to just sit up in the chair.   [facility] Mobility Specialist  M.S. Primary Phone: [phone] M.S. Secondary Phone: [phone]

## 2021-06-05 NOTE — Progress Notes (Addendum)
PHARMACY - TOTAL PARENTERAL NUTRITION CONSULT NOTE  Indication: Small bowel obstruction  Patient Measurements: Height: 5\' 11"  (180.3 cm) Weight: 71.9 kg (158 lb 8.2 oz) IBW/kg (Calculated) : 75.3 TPN AdjBW (KG): 69.9 Body mass index is 22.11 kg/m. Usual Weight: 154 lb 02/28/21  Assessment:  79 yom with ESRD admitted with worsening abdominal pain, found to have high-grade SBO. He had recent admission at [facility] for SBO resolved with conservative management; also had choledocholithiasis s/p ERCP with stone extraction, stent to CBD and PD. Lap chole recommended but declined. Surgery reports no flatus or BM except with enema on 12/5 and unlikely to resolve without surgical intervention. Given prolonged NPO status, pharmacy consulted for TPN for nutritional support. At risk for refeeding syndrome.  Glucose / Insulin: no hx DM,  CBGs remain <120. Off insulin   Electrolytes: Na 134 low, K 4, (goal >/= 4 for Afib/ileus), Bicarb 20 slightly low, Phos 3.5, Mag 2.2 (2g IV, goal >/= 2),  Renal: ESRD on HD TTS Hepatic: Alkphos 263, AST/ALT WNL, Tbili 1.5, LDL 42 Intake / Output; MIVF: HD pt with minimal UOP. No IVF. LBM 12/18 GI Imaging:  12/3 Abd XR: Probable persistent SBO not well evaluated 12/4 CT A/P: diffusely dilated SB with decompressed terminal ileum c/w SBO 12/6 Abd XR: dilation of small-bowel loops suggesting ileus or pSBO 12/7 Abd XR: unchanged 12/11 Abx XR: increased in dilated bowel loops 12/12 CT: Dilated mid small bowel loops, small volume perihepatic ascites  GI Surgeries / Procedures:  12/7 cholecystectomy with SBR  for gangrenous GB and chronic SB ischemia 12/12 EGD: ulcerative esophagitis, bleeding AVM post clip/epi/APC  Central access: IJ placed by IR 05/23/21 TPN start date: 05/23/21  Nutritional Goals: RD Estimated Needs Total Energy Estimated Needs: 1900-2100 Total Protein Estimated Needs: 100-115 gm Total Fluid Estimated Needs: 1.9 L  Current Nutrition:   TPN NPO- NGT Clamped - tolerated 12/15 without emesis 12/16 CLD  12/17 FLD- only ate bites of jello a few spoonfuls of broth, a lot of water "that just runs through me," no nutrition shakes. Per pt low appetite and abdominal pain when eating 12/18: Soft diet: Zero intake charted 12/18  Plan:  Decrease cyclic concentrated TPN over 18 hours (rate 23-46 mL/hr, GIR<2 mg/kg/hr) to help stimulate appetite. Will provide 58.5g AA, 132.6g CHO and 28.8g ILE for a total of 972 kCal, meeting 50% of needs  Con't Electrolytes in TPN: Cl:Ac 1:1; Continue Na 135 mEq/L,  K 27 meq/L, Ca 2 mEq/L, Mag to 5 mEq/L, Phos 10 mmol/L    Add standard MVI and trace elements to TPN; remove chromium in patient on dialysis F/U TPN labs daily until stable at cyclic goal, then Mon/Thurs   F/u PO intake and ability to d/c TPN D/c CBG checks   Aisia Correira S. Alford Highland, PharmD, BCPS Clinical Staff Pharmacist [email or web address]

## 2021-06-05 NOTE — Progress Notes (Signed)
PROGRESS NOTE   KYAL ARTS  WUJ:[id number]    DOB: 1956-02-07    DOA: 05/19/2021  PCP: Dorena Dew, FNP   I have briefly reviewed patients previous medical records in [facility].  Chief Complaint  Patient presents with   Small Bowel Obstruction    Brief Narrative:  65 year old male with medical history significant for ESRD on TTS HD, chronic systolic CHF, COPD, pulmonary hypertension, sickle cell anemia, chronic A. fib on Eliquis, SBO, choledocholithiasis, presented to [facility] on 05/19/2021 with recurrent SBO and choledocholithiasis.  On 05/09/2021, patient was hospitalized at [facility] with SBO and choledocholithiasis.  SBO resolved with conservative measures and choledocholithiasis improved with ERCP and stent placement.    12/7: Underwent exploratory laparotomy, surgical resection of SBO and cholecystectomy.  Postprocedure, patient remained on vasopressors and intubated.  Extubated 12/8 and transferred to [facility] on 12/9.  12/11, noted persistent bloody drainage from NGT, general surgery and [facility] on board, s/p DDAVP and Protonix infusion.  12/12: EGD showed significant amount of blood in the stomach, lavaged and AVM bleeding, treated endoscopically.  Care transferred back from PCCM to [facility] on 12/14.  Course complicated by prolonged postop ileus, finally resolving.   Assessment & Plan:  Principal Problem:   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 05/24/2021 Active Problems:   Abdominal pain   Nausea & vomiting   Anemia   End stage renal disease (HCC)   Permanent atrial fibrillation (HCC)   Chronic systolic CHF (congestive heart failure) (HCC)   Upper GI bleed   SBO (small bowel obstruction) (HCC)   Protein-calorie malnutrition, severe   ABLA (acute blood loss anemia)   Empyema of gallbladder   Acute gangrenous cholecystitis s/p open cholcystectomy 05/24/2021    SBO due to chronic ischemic segment of SB, postop ileus: 12/7: S/p diagnostic  laparoscopy converted to exploratory laparotomies for small bowel resection and cholecystectomy for gangrenous cholecystitis and chronic ischemic segment of SB causing an obstruction.  General surgery continues to follow.  Twice daily dressing changes.  Prolonged postop ileus.  TNA for nutritional support.  Completed 5 days of Zosyn 12/12.  Course complicated by prolonged postop ileus.  General surgery managing.  DC'd PCA, JP drain and NG tube. Ongoing issues with abdominal pain limiting mobilization. WBC also up.  Defer reimaging decision to [facility] follow-up.  Remains on multimodality pain control (schedule Tylenol, as needed Oxy IR/tramadol and as needed IV Dilaudid).  Tolerating liquids.  Having BMs.  Reports that he has not started soft diet yet and plans to do today.  Gangrenous cholecystitis: 12/7 cholecystectomy; recent Choledocholithiasis s/p recent ERCP stent placement at [facility]  Acute upper GI bleed S/p DDAVP 12/11.  [facility] on board.  S/p EGD 12/12 showed significant amount of blood in the stomach, lavaged and AVM bleeding in the stomach.  Unclear if due to trauma from NG causing this.  Treated endoscopically.  NG still draining dark liquid.  IV PPI fusion transition to PPI twice daily.  Eliquis on hold.  S/p PRBC transfusion 12/13.  Hemoglobin gradually drifting down (8.2 > 7.7 > 7.4 >7.1), which may be due to lab draws and multifactorial rather than active GI bleed.  Upper GI bleed seems to have clinically resolved.  ESRD: on HD tues, thurs, sat Nephrology following.  S/p HD 12/17.  Next HD on 12/20  Hypokalemia Replaced.  Magnesium 2.2.   Acute Blood Loss Anemia  >> AVM GI Bleed per EGD Unsure cause -  Prior EGD in 2019 showed nonbleeding gastritis, Colonoscopy showed small internal hemorrhoids, otherwise normal -Last PRBC transfusion 12/13.    - Hemoglobin gradually drifting down (8.2 > 7.7 > 7.4 >7.1), which may be due to lab draws and multifactorial rather than active GI bleed.     - Continue to follow daily CBC.  Transfuse if hemoglobin 7 g or lower. -Follow CBC in a.m. and likely transfuse at HD on 12/20.  S/p Aranesp.  No iron supplements required.  Per nephrology note, has received 9 units PRBCs thus far this admission.  Chronic Anemia: sickle cell and CKD.  Management as above.  Thrombocytopenia Resolved.   HX COPD:  - takes incruse and ellipta at home.  No clinical bronchospasm. -Continue brovana, yupelri, and pulmicort -Pulm toilet as able -Not hypoxic.   Chronic combined systolic and diastolic CHF:  - echo 1157 EF of 40 to 45% with mild right ventricular dysfunction and severe biatrial enlargement and severe mitral regurg and tricuspid regurg -Volume management across HD.  Does not appear overtly volume overloaded.  Persistent Afib/atrial flutter with RVR:  - on eliquis at home, currently on hold due to postop ileus and upper GI bleed. - rate controlled.  Although GI has cleared to resume Eliquis, given low hemoglobin and concern for recurrent GI bleed, will hold off for another 24 hours and reassess.  Could possibly restart tomorrow posttransfusion if still no concerns for active bleeding.  Pulm HTN -Heart Failure following -Holding home Hydralazine, Imdur, Torsemide given borderline hypotension    Sickle cell disease Plan: -No sickle cell related pain.  PCA discontinued.  On multimodality pain control.   Protein calorie malnutrition Plan: -Continue TPN  HTN: -Controlled.    Body mass index is 22.11 kg/m.  Nutritional Status Nutrition Problem: Severe Malnutrition Etiology: acute illness (SBO) Signs/Symptoms: moderate fat depletion, moderate muscle depletion, percent weight loss (8% weight loss within 3 months) Percent weight loss: 8 % Interventions: TPN  Pressure Ulcer:     DVT prophylaxis: SCDs Start: 05/19/21 2116     Code Status: Full Code Family Communication: None at bedside. Disposition:  Status is: Inpatient  Remains  inpatient appropriate because: Severity of illness including postop ileus, TNA, still working on advancing diet.        Consultants:   PCCM General surgery [place] AHF team  Procedures:   As above.  Antimicrobials:   As above.  Subjective:  Continues to have BMs.  Tolerating liquid diet.  States that he has not started soft diet yet and plans to do it now.  Indicates abdominal pain starts after he has been up and about and after eating but improves after pain meds.  Objective:   Vitals:   06/05/21 0727 06/05/21 0732 06/05/21 0734 06/05/21 0817  BP:    123/73  Pulse: 86     Resp: 18   19  Temp:    98.1 F (36.7 C)  TempSrc:    Oral  SpO2: 99% 99% 99% 98%  Weight:      Height:        General exam: Young male, moderately built and nourished lying in left lateral position with the pillow on his abdomen for comfort, looks better today than he did yesterday Respiratory system: Clear to auscultation.  No increased work of breathing. Right upper anterior chest Port-A-Cath. Cardiovascular system: S1 & S2 heard, irregularly irregular. No JVD, murmurs, rubs, gallops or clicks. No pedal edema. Gastrointestinal system: Abdomen continues to be mildly distended, nontender,  soft.  Surgical site dressings clean and dry.  Bowel sounds present. Central nervous system: Alert and oriented. No focal neurological deficits. Extremities: Symmetric 5 x 5 power. Skin: No rashes, lesions or ulcers Psychiatry: Judgement and insight appear normal. Mood & affect flat.     Data Reviewed:   I have personally reviewed following labs and imaging studies   CBC: Recent Labs  Lab 05/31/21 0238 05/31/21 1518 06/03/21 0335 06/04/21 0619 06/05/21 0350  WBC 14.4*   < > 10.7* 12.2* 12.4*  NEUTROABS 11.4*  --   --   --   --   HGB 7.9*   < > 7.7* 7.4* 7.1*  HCT 22.5*   < > 23.4* 21.7* 21.8*  MCV 86.9   < > 95.9 91.6 91.2  PLT 132*   < > 224 209 224   < > = values in this interval  not displayed.    Basic Metabolic Panel: Recent Labs  Lab 05/31/21 0238 06/01/21 0517 06/03/21 0500 06/04/21 0619 06/05/21 0350  NA 134* 137 137 137 134*  K 3.4* 3.5 2.9* 3.9 4.0  CL 100 101 111 104 104  CO2 26 25 16* 24 20*  GLUCOSE 105* 108* 90 105* 96  BUN 47* 43* 73* 50* 71*  CREATININE 4.24* 3.40* 4.54* 3.71* 4.60*  CALCIUM 8.3* 8.6* 7.2* 8.4* 8.6*  MG 2.3 2.0 1.7 2.2 2.2  PHOS 3.7 2.6 3.6 3.5 4.4    Liver Function Tests: Recent Labs  Lab 05/30/21 0427 05/31/21 0238 06/01/21 0517 06/05/21 0350  AST 18 19 23 20   ALT 9 9 13 16   ALKPHOS 99 110 161* 263*  BILITOT 1.3* 2.1* 1.6* 1.5*  PROT 5.1* 4.8* 5.1* 5.4*  ALBUMIN 1.7* 1.6* 1.8* 1.7*    CBG: Recent Labs  Lab 06/04/21 2328 06/05/21 0348 06/05/21 0728  GLUCAP 118* 93 105*    Microbiology Studies:   No results found for this or any previous visit (from the past 240 hour(s)).   Radiology Studies:  No results found.  Scheduled Meds:    acetaminophen  1,000 mg Oral Q6H   arformoterol  15 mcg Nebulization BID   budesonide (PULMICORT) nebulizer solution  0.25 mg Nebulization BID   Chlorhexidine Gluconate Cloth  6 each Topical Daily   [START ON 06/06/2021] darbepoetin (ARANESP) injection - DIALYSIS  200 mcg Intravenous Q Tue-HD   mouth rinse  15 mL Mouth Rinse BID   methocarbamol  1,000 mg Oral QID   pantoprazole  40 mg Intravenous Q12H   revefenacin  175 mcg Nebulization Daily   sodium chloride flush  10-40 mL Intracatheter Q12H    Continuous Infusions:    TPN CYCLIC-ADULT (ION) 92 mL/hr at 69/67/89 3810   TPN CYCLIC-ADULT (ION)       LOS: 17 days     Vernell Leep, MD,  FACP, [facility], [facility], [facility] (Care Management Physician Certified) [place]  To contact the attending provider between 7A-7P or the covering provider during after hours 7P-7A, please log into the web site [email or web address] and access using universal [place] password for that web site.  If you do not have the password, please call the hospital operator.  06/05/2021, 9:57 AM

## 2021-06-05 NOTE — Progress Notes (Signed)
Patient ID: Patrick [name], male   DOB: 11-Dec-1955, 65 y.o.   MRN: [id number] S: Not feeling well this morning, complaining of abdominal pain and loss of appetite.  Having bowel movements. O:BP 123/73    Pulse 86    Temp 98.1 F (36.7 C) (Oral)    Resp 19    Ht 5\' 11"  (1.803 m)    Wt 71.9 kg    SpO2 98%    BMI 22.11 kg/m   Intake/Output Summary (Last 24 hours) at 06/05/2021 0829 Last data filed at 06/05/2021 0400 Gross per 24 hour  Intake 2426.35 ml  Output --  Net 2426.35 ml   Intake/Output: I/O last 3 completed shifts: In: 2476.4 [P.O.:50; I.V.:2426.4] Out: 50 [Urine:50]  Intake/Output this shift:  No intake/output data recorded. Weight change: 1.5 kg Gen:NAD CVS: RRR Resp:CTA Abd: +BS, soft, mildly tender Ext: no edema, RUE AVF +T/B  Recent Labs  Lab 05/30/21 0427 05/30/21 1205 05/30/21 1914 05/31/21 0238 06/01/21 0517 06/03/21 0500 06/04/21 0619 06/05/21 0350  NA 135 138  --  134* 137 137 137 134*  K 2.7* 3.4*  --  3.4* 3.5 2.9* 3.9 4.0  CL 99 100  --  100 101 111 104 104  CO2 28 26  --  26 25 16* 24 20*  GLUCOSE 118* 111*  --  105* 108* 90 105* 96  BUN 24* 33*  --  47* 43* 73* 50* 71*  CREATININE 2.72* 3.18*  --  4.24* 3.40* 4.54* 3.71* 4.60*  ALBUMIN 1.7*  --   --  1.6* 1.8*  --   --  1.7*  CALCIUM 8.2* 8.7*  --  8.3* 8.6* 7.2* 8.4* 8.6*  PHOS 1.5*  --  3.2 3.7 2.6 3.6 3.5 4.4  AST 18  --   --  19 23  --   --  20  ALT 9  --   --  9 13  --   --  16   Liver Function Tests: Recent Labs  Lab 05/31/21 0238 06/01/21 0517 06/05/21 0350  AST 19 23 20   ALT 9 13 16   ALKPHOS 110 161* 263*  BILITOT 2.1* 1.6* 1.5*  PROT 4.8* 5.1* 5.4*  ALBUMIN 1.6* 1.8* 1.7*   No results for input(s): LIPASE, AMYLASE in the last 168 hours. No results for input(s): AMMONIA in the last 168 hours. CBC: Recent Labs  Lab 05/31/21 0238 05/31/21 1518 06/01/21 1755 06/02/21 0644 06/03/21 0335 06/04/21 0619 06/05/21 0350  WBC 14.4*   < > 11.1* 11.1* 10.7* 12.2* 12.4*   NEUTROABS 11.4*  --   --   --   --   --   --   HGB 7.9*   < > 8.0* 8.2* 7.7* 7.4* 7.1*  HCT 22.5*   < > 24.2* 24.2* 23.4* 21.7* 21.8*  MCV 86.9   < > 98.8 92.4 95.9 91.6 91.2  PLT 132*   < > 171 198 224 209 224   < > = values in this interval not displayed.   Cardiac Enzymes: No results for input(s): CKTOTAL, CKMB, CKMBINDEX, TROPONINI in the last 168 hours. CBG: Recent Labs  Lab 06/04/21 0531 06/04/21 2018 06/04/21 2328 06/05/21 0348 06/05/21 0728  GLUCAP 114* 119* 118* 93 105*    Iron Studies: No results for input(s): IRON, TIBC, TRANSFERRIN, FERRITIN in the last 72 hours. Studies/Results: No results found.  acetaminophen  1,000 mg Oral Q6H   arformoterol  15 mcg Nebulization BID   budesonide (PULMICORT) nebulizer solution  0.25 mg Nebulization BID   Chlorhexidine Gluconate Cloth  6 each Topical Daily   darbepoetin (ARANESP) injection - DIALYSIS  200 mcg Intravenous Q Wed-HD   mouth rinse  15 mL Mouth Rinse BID   methocarbamol  1,000 mg Oral QID   pantoprazole  40 mg Intravenous Q12H   revefenacin  175 mcg Nebulization Daily   sodium chloride flush  10-40 mL Intracatheter Q12H    BMET    Component Value Date/Time   NA 134 (L) 06/05/2021 0350   NA 135 05/24/2020 1119   K 4.0 06/05/2021 0350   CL 104 06/05/2021 0350   CO2 20 (L) 06/05/2021 0350   GLUCOSE 96 06/05/2021 0350   BUN 71 (H) 06/05/2021 0350   BUN 25 05/24/2020 1119   CREATININE 4.60 (H) 06/05/2021 0350   CREATININE 2.90 (H) 08/11/2015 1359   CALCIUM 8.6 (L) 06/05/2021 0350   GFRNONAA 14 (L) 06/05/2021 0350   GFRNONAA 23 (L) 08/11/2015 1359   GFRAA 24 (L) 05/24/2020 1119   GFRAA 27 (L) 08/11/2015 1359   CBC    Component Value Date/Time   WBC 12.4 (H) 06/05/2021 0350   RBC 2.39 (L) 06/05/2021 0350   HGB 7.1 (L) 06/05/2021 0350   HGB 9.1 (L) 05/24/2020 1119   HCT 21.8 (L) 06/05/2021 0350   HCT 25.6 (L) 05/24/2020 1119   PLT 224 06/05/2021 0350   PLT 88 (LL) 05/24/2020 1119   MCV 91.2  06/05/2021 0350   MCV 124 (H) 05/24/2020 1119   MCH 29.7 06/05/2021 0350   MCHC 32.6 06/05/2021 0350   RDW 19.2 (H) 06/05/2021 0350   RDW 14.6 05/24/2020 1119   LYMPHSABS 0.6 (L) 05/31/2021 0238   LYMPHSABS 0.9 05/24/2020 1119   MONOABS 1.7 (H) 05/31/2021 0238   EOSABS 0.4 05/31/2021 0238   EOSABS 0.1 05/24/2020 1119   BASOSABS 0.1 05/31/2021 0238   BASOSABS 0.1 05/24/2020 1119    OP HD: TTS AF   4h  400/500   65kg  2/2 bath  P2  AVF  Hep none - Sensipar 30mg  PO q HD - Venofer 50mg  IV weekly - Mircera 263mcg IV q 2 weeks (last 11/8)   Assessment/ Plan SBO/ sp ex lap w/ cholecystectomy and SB resection - on 12/07, done for gangrenous GB and chronic SB ischemia. Having BM's and advancing diet but reports poor appetite and some abdominal pain today. Remains on TPN. Per gen surgery.  SP recent gallstone with CBD &pancreatic duct stenting UGIB - d/t AVM in stomach, rx'd by GI w/ APC/ endoclips/ epi sq on 12/12  ESRD: usual HD TTS. Continue with outpatient HD schedule.  HTN/volume: BP stable, 2.5 L off on HD, wt up to 72kg today (7kg above edw), will get standing weight before HD tomorrow.  Anemia CKD/ sickle cell/ abl-  Aranesp with HD weekly on Wed, last 12/14.  Tsat 34%, no Fe needed. Hb 8's now, sp 9u prbc's total here.  Hypokalemia - up to 3.5 yesterday  Metabolic bone disease: Ca/Phos ok - resume binders when eating  Nutrition: on TPN, NG out, advancing diet  A-fib: per cardiology   Donetta Potts, MD [facility] [phone]

## 2021-06-05 NOTE — Progress Notes (Addendum)
Advanced Heart Failure Rounding Note  PCP-Cardiologist: Dr. Haroldine Laws   Subjective:    Echo 12/06: EF 40-45%, RV mildly reduced, RVSP 55 mmHg, severe MR, severe TR S/p lap, cholecystectomy and SB resection 12/07.  Extubated 12/8  12/11 CCM Re-consulted due bleeding. 2 U PRBCs 12/12 Hg down to 6.6. Given 1UPRBCs. EGD with actively bleeding AVM in stomach treated endoscopically, nonbleeding ulcerative esophagitis 12/13 1 u PRBCs  Feeling a little better. Can eat a little bit of food.    Objective:   Weight Range: 71.9 kg Body mass index is 22.11 kg/m.   Vital Signs:   Temp:  [98.1 F (36.7 C)-99.1 F (37.3 C)] 98.1 F (36.7 C) (12/19 0817) Pulse Rate:  [79-86] 86 (12/19 0727) Resp:  [13-19] 19 (12/19 0817) BP: (97-127)/(52-73) 123/73 (12/19 0817) SpO2:  [96 %-100 %] 98 % (12/19 0817) Weight:  [71.9 kg] 71.9 kg (12/19 0619) Last BM Date: 06/04/21  Weight change: Filed Weights   06/03/21 1400 06/04/21 0524 06/05/21 0619  Weight: 68 kg 66.8 kg 71.9 kg    Intake/Output:   Intake/Output Summary (Last 24 hours) at 06/05/2021 1046 Last data filed at 06/05/2021 0926 Gross per 24 hour  Intake 2426.35 ml  Output --  Net 2426.35 ml      Physical Exam   General:   No resp difficulty HEENT: normal Neck: supple. no JVD. Carotids 2+ bilat; no bruits. No lymphadenopathy or thryomegaly appreciated. Cor: PMI nondisplaced. Irregular rate & rhythm. No rubs, gallops or murmurs. R upper chest tunneled catheter.  Lungs: clear Abdomen: Dressing. soft, nontender, nondistended. No hepatosplenomegaly. No bruits or masses. Good bowel sounds.Abdominal dressing.  Extremities: no cyanosis, clubbing, rash, edema. RUE AVF  Neuro: alert & orientedx3, cranial nerves grossly intact. moves all 4 extremities w/o difficulty. Affect pleasant  Telemetry  A fib 80s personally reviewed.   Labs    CBC Recent Labs    06/04/21 0619 06/05/21 0350  WBC 12.2* 12.4*  HGB 7.4* 7.1*  HCT  21.7* 21.8*  MCV 91.6 91.2  PLT 209 030   Basic Metabolic Panel Recent Labs    06/04/21 0619 06/05/21 0350  NA 137 134*  K 3.9 4.0  CL 104 104  CO2 24 20*  GLUCOSE 105* 96  BUN 50* 71*  CREATININE 3.71* 4.60*  CALCIUM 8.4* 8.6*  MG 2.2 2.2  PHOS 3.5 4.4   Liver Function Tests Recent Labs    06/05/21 0350  AST 20  ALT 16  ALKPHOS 263*  BILITOT 1.5*  PROT 5.4*  ALBUMIN 1.7*   No results for input(s): LIPASE, AMYLASE in the last 72 hours. Cardiac Enzymes No results for input(s): CKTOTAL, CKMB, CKMBINDEX, TROPONINI in the last 72 hours.  BNP: BNP (last 3 results) Recent Labs    08/01/20 2047  BNP 2,730.5*    ProBNP (last 3 results) No results for input(s): PROBNP in the last 8760 hours.   D-Dimer No results for input(s): DDIMER in the last 72 hours.  Hemoglobin A1C No results for input(s): HGBA1C in the last 72 hours. Fasting Lipid Panel Recent Labs    06/05/21 0350  TRIG 42     Thyroid Function Tests No results for input(s): TSH, T4TOTAL, T3FREE, THYROIDAB in the last 72 hours.  Invalid input(s): FREET3  Other results:   Imaging    No results found.   Medications:     Scheduled Medications:  acetaminophen  1,000 mg Oral Q6H   arformoterol  15 mcg Nebulization BID  budesonide (PULMICORT) nebulizer solution  0.25 mg Nebulization BID   Chlorhexidine Gluconate Cloth  6 each Topical Daily   [START ON 06/06/2021] darbepoetin (ARANESP) injection - DIALYSIS  200 mcg Intravenous Q Tue-HD   mouth rinse  15 mL Mouth Rinse BID   methocarbamol  1,000 mg Oral QID   pantoprazole  40 mg Intravenous Q12H   revefenacin  175 mcg Nebulization Daily   sodium chloride flush  10-40 mL Intracatheter Q12H    Infusions:  TPN CYCLIC-ADULT (ION) 46 mL/hr at 06/26/30 3557   TPN CYCLIC-ADULT (ION)      PRN Medications: albuterol, hydrALAZINE, HYDROmorphone (DILAUDID) injection, oxyCODONE, polyvinyl alcohol, simethicone, sodium chloride flush,  traMADol    Patient Profile   65 y/o chronically ill male with sickle cell dz, ESRD, chronic systolic HF EF 32%, pulmonary HTN, chronic AF and legal blindness. Admitted with recurrent SBO and a/c GI bleeding. We are asked to help manage AF with RVR and assess peri-operative risk.   Assessment/Plan    1. Persistent Atrial Fibrillation/AFL - S/p DCCV 05/2018 - S/P DCCV 10/06/19 NSR - Recurrence 05/21. Has been seen in AF Clinic and rate control strategy recommended -  Rate is controlled  - Holding anticoagulation due to recurrent bleeding and surgery    2. Chronic combined Systolic/Diastolic Heart Failure:  - NICM. Cath 05/2011 without significant coronary disease.   - ECHO 12/2015 40%.  - Echo 05/2018: EF 25-30%.  - Echo 3/21 LVEF 35-40%, low normal RV function,severe pulm HTN. Severe LAE, mod RAE, mod MR, severe TR. RV TAPSE 1.6, RV s' 10 - Echo 9/21 EF 40-45%, RV mildly reduced, mild RAE, severe pulm artery systolic pressure, severe TR - Echo this admit suggestive of restrictive CM with EF 40-45%, RV mildly decreased, severe MR, severe TR, RVSP 55 mmHg - Volume status stable and managed with HD on T,T,S - IV hydralazine PRN  - Off Entresto with ESRD   3. Anemia with H/o GI bleeding: - Prior EGD in 2019 showed nonbleeding gastritis, Colonoscopy showed small internal hemorrhoids, otherwise normal.  - GI bleed 02/22 - gastric erosions on EGD - A/C anemia 11/22 - transfused with 3 uPRBCs - 12/11 started bleeding again. Hemoptysis. Given 2 U PRBcs - 12/11 1 U PRBCs. EGD with bleeding AVM in stomach treated with APC and 3 clips, nonbleeding ulcerative gastritis - A/C anemia, received 9 u PRBcs so far this admit (1 u 12/12 and 1 u 12/13), 2 u FFP - Hgb 7.1 today. Transfuse if drops < 7 - Receiving aranesp - On IV protonix - Holding anticoagulation. Need to weigh risks/benefits of resuming anticoagulation with hx recurrent bleeding.    4. SBO/gangrenous cholecystitis - s/p lap,  cholecystectomy and SB resection 12/07 - Post op ileus, still has NGT. Attempting clamping trials today. - Imaging per GI/surgery .  - Has open abdominal wound with dressing changes twice daily  - Completed abx   6. Pulmonary HTN  - Last echo 09/21 with EF 40-45%, RV mildly reduced, RVSP 75 mmHg, severe TR - Echo this admit with EF 40-45%, RV mildly reduced, severe MR, severe TR, RVSP 55 mmHg   7. Abdominal ascites -Noted on CT -Mild hepatic nodularity present   8. HTN:- Stable.  - PRN IV hydralazine    9.  Sickle Cell:   - Follows closely with [facility] Cell [facility].    72. Former smoker:   - Quit 11/2015.   11. ESRD - On HD now T/T/S - Nephrology following  12.  Hypokalemia - K 4, stable.    Length of Stay: [place], NP  06/05/2021, 10:46 AM  Advanced Heart Failure Team Pager [phone] (M-F; 7a - 5p)  Please contact [facility] for night-coverage after hours (5p -7a ) and weekends on [email or web address]  Patient seen and examined with the above-signed Advanced Practice Provider and/or Housestaff. I personally reviewed laboratory data, imaging studies and relevant notes. I independently examined the patient and formulated the important aspects of the plan. I have edited the note to reflect any of my changes or salient points. I have personally discussed the plan with the patient and/or family.  Post-op ileus improving but still having pain. Passing gas and having BMs. Refused to walk with Mobility team today. Volume status maintained by HD. Remains in chronic, rate-controlled AF. Eliquis still on hold  General:  Lying in bed . No resp difficulty HEENT: normal Neck: supple. JVP 6-7 Carotids 2+ bilat; no bruits. No lymphadenopathy or thryomegaly appreciated. Cor: PMI nondisplaced. Irregular rate & rhythm. No rubs, gallops or murmurs. Lungs: clear Abdomen: soft, mildly tender. Dressing in place. + distended. No hepatosplenomegaly. No bruits or masses. Good bowel  sounds. Extremities: no cyanosis, clubbing, rash, edema Neuro: alert & orientedx3, cranial nerves grossly intact. moves all 4 extremities w/o difficulty. Affect pleasant  Stable from cardiac standpoint. AF rate controlled. Would restart Eliquis as soon as OK with GSU. If want to continue to hold would at least consider DVT prophylaxis with lovenox. Encouraged him to participate in therapy and walks.   AHF will sign off. Please call with questions.   Glori Bickers, MD  11:03 PM

## 2021-06-06 ENCOUNTER — Ambulatory Visit: Payer: HMO | Admitting: Family Medicine

## 2021-06-06 LAB — RENAL FUNCTION PANEL
Albumin: 1.6 g/dL — ABNORMAL LOW (ref 3.5–5.0)
Anion gap: 12 (ref 5–15)
BUN: 89 mg/dL — ABNORMAL HIGH (ref 8–23)
CO2: 18 mmol/L — ABNORMAL LOW (ref 22–32)
Calcium: 8.2 mg/dL — ABNORMAL LOW (ref 8.9–10.3)
Chloride: 100 mmol/L (ref 98–111)
Creatinine, Ser: 5.23 mg/dL — ABNORMAL HIGH (ref 0.61–1.24)
GFR, Estimated: 12 mL/min — ABNORMAL LOW (ref >60)
Glucose, Bld: 664 mg/dL (ref 70–99)
Phosphorus: 6.5 mg/dL — ABNORMAL HIGH (ref 2.5–4.6)
Potassium: 5.5 mmol/L — ABNORMAL HIGH (ref 3.5–5.1)
Sodium: 130 mmol/L — ABNORMAL LOW (ref 135–145)

## 2021-06-06 LAB — GLUCOSE, CAPILLARY: Glucose-Capillary: 94 mg/dL (ref 70–99)

## 2021-06-06 LAB — CBC
HCT: 20.5 % — ABNORMAL LOW (ref 39.0–52.0)
Hemoglobin: 7 g/dL — ABNORMAL LOW (ref 13.0–17.0)
MCH: 32.3 pg (ref 26.0–34.0)
MCHC: 34.1 g/dL (ref 30.0–36.0)
MCV: 94.5 fL (ref 80.0–100.0)
Platelets: 217 10*3/uL (ref 150–400)
RBC: 2.17 MIL/uL — ABNORMAL LOW (ref 4.22–5.81)
RDW: 18.8 % — ABNORMAL HIGH (ref 11.5–15.5)
WBC: 9.9 10*3/uL (ref 4.0–10.5)
nRBC: 0.6 % — ABNORMAL HIGH (ref 0.0–0.2)

## 2021-06-06 LAB — PREPARE RBC (CROSSMATCH)

## 2021-06-06 MED ORDER — BOOST / RESOURCE BREEZE PO LIQD CUSTOM
1.0000 | Freq: Three times a day (TID) | ORAL | Status: DC
  Administered 2021-06-07 – 2021-06-08 (×2): 1 via ORAL

## 2021-06-06 MED ORDER — APIXABAN 5 MG PO TABS
5.0000 mg | ORAL_TABLET | Freq: Two times a day (BID) | ORAL | Status: DC
  Administered 2021-06-06 – 2021-06-09 (×7): 5 mg via ORAL
  Filled 2021-06-06 (×7): qty 1

## 2021-06-06 MED ORDER — SODIUM CHLORIDE 0.9% IV SOLUTION: Freq: Once | INTRAVENOUS | Status: AC

## 2021-06-06 NOTE — Progress Notes (Signed)
PT Cancellation Note  Patient Details Name: KEYMON MCELROY MRN: [id number] DOB: 06-21-1955   Cancelled Treatment:    Reason Eval/Treat Not Completed: Patient at procedure or test/unavailable. Pt in HD.   Shary Decamp [facility] 06/06/2021, 7:51 AM [place] Pager [phone] Office [phone]

## 2021-06-06 NOTE — Progress Notes (Signed)
Progress Note  8 Days Post-Op  Subjective: Reports he ate well yesterday. Still pain with movement, as expected  Objective: Vital signs in last 24 hours: Temp:  [98 F (36.7 C)-98.7 F (37.1 C)] 98.2 F (36.8 C) (12/20 0750) Pulse Rate:  [77-87] 82 (12/20 0900) Resp:  [13-19] 13 (12/20 0750) BP: (109-124)/(60-70) 109/68 (12/20 0900) SpO2:  [96 %-100 %] 100 % (12/20 0750) Weight:  [71.1 kg] 71.1 kg (12/20 0750) Last BM Date: 06/04/21  Intake/Output from previous day: 12/19 0701 - 12/20 0700 In: 31.1 [I.V.:31.1] Out: 150 [Urine:150] Intake/Output this shift: No intake/output data recorded.  PE: General: NAD Abd: soft, appropriately tender. Not significantly distended.      Lab Results:  Recent Labs    06/05/21 0350 06/06/21 0720  WBC 12.4* 9.9  HGB 7.1* 7.0*  HCT 21.8* 20.5*  PLT 224 217    BMET Recent Labs    06/05/21 0350 06/06/21 0719  NA 134* 130*  K 4.0 5.5*  CL 104 100  CO2 20* 18*  GLUCOSE 96 664*  BUN 71* 89*  CREATININE 4.60* 5.23*  CALCIUM 8.6* 8.2*    PT/INR No results for input(s): LABPROT, INR in the last 72 hours. CMP     Component Value Date/Time   NA 130 (L) 06/06/2021 0719   NA 135 05/24/2020 1119   K 5.5 (H) 06/06/2021 0719   CL 100 06/06/2021 0719   CO2 18 (L) 06/06/2021 0719   GLUCOSE 664 (HH) 06/06/2021 0719   BUN 89 (H) 06/06/2021 0719   BUN 25 05/24/2020 1119   CREATININE 5.23 (H) 06/06/2021 0719   CREATININE 2.90 (H) 08/11/2015 1359   CALCIUM 8.2 (L) 06/06/2021 0719   PROT 5.4 (L) 06/05/2021 0350   PROT 7.5 05/24/2020 1119   ALBUMIN 1.6 (L) 06/06/2021 0719   ALBUMIN 4.1 05/24/2020 1119   AST 20 06/05/2021 0350   ALT 16 06/05/2021 0350   ALKPHOS 263 (H) 06/05/2021 0350   BILITOT 1.5 (H) 06/05/2021 0350   BILITOT 2.4 (H) 05/24/2020 1119   GFRNONAA 12 (L) 06/06/2021 0719   GFRNONAA 23 (L) 08/11/2015 1359   GFRAA 24 (L) 05/24/2020 1119   GFRAA 27 (L) 08/11/2015 1359   Lipase     Component Value Date/Time    LIPASE 51 05/19/2021 1445       Studies/Results: No results found.  Anti-infectives: Anti-infectives (From admission, onward)    Start     Dose/Rate Route Frequency Ordered Stop   05/24/21 1730  piperacillin-tazobactam (ZOSYN) IVPB 2.25 g  Status:  Discontinued        2.25 g 100 mL/hr over 30 Minutes Intravenous Every 8 hours 05/24/21 1654 05/29/21 1303   05/24/21 1615  metroNIDAZOLE (FLAGYL) IVPB 500 mg       Note to Pharmacy: Please tube to #[phone]    500 mg 100 mL/hr over 60 Minutes Intravenous  Once 05/24/21 1603 05/24/21 1630        Assessment/Plan POD 13, s/p dx lap converted to ex lap for SBR and cholecystectomy for gangrenous cholecystitis and chronic ischemic segment of SB causing an obstruction, Dr. Donne Hazel 05/24/21 [facility] - BID dressing changes - prn dressing changes over JP drain site. - soft diet /wean off TPN - mobilize/pulm toilet - zosyn stopped 12/12 after 5 days post op completed -Multimodal pain regimen, wean narcotics as able   FEN: soft diet, wean TPN VTE: SCDs, DVT prophylaxis ok at this point- hgb stable @ 7 ID: [email or web address], completed  12/12   UGI bleed - secondary to AVM in the gastric body, s/p tx by GI 12/15.  Hgb stable ESRD - TTS, per renal CHF - per cards A. Fib on eliquis, on hold Acute blood loss Anemia - hgb stable 7 Acute hypoxic respiratory failure - extubated and doing well  LOS: 18 days    Clovis Riley, MD [facility] 06/06/2021, 9:13 AM Please see Amion for pager number during day hours 7:00am-4:30pm

## 2021-06-06 NOTE — Progress Notes (Signed)
PROGRESS NOTE   Patrick [name]  VOZ:[id number]    DOB: 1956-06-17    DOA: 05/19/2021  PCP: Dorena Dew, FNP   I have briefly reviewed patients previous medical records in [facility].  Chief Complaint  Patient presents with   Small Bowel Obstruction    Brief Narrative:  65 year old male with medical history significant for ESRD on TTS HD, chronic systolic CHF, COPD, pulmonary hypertension, sickle cell anemia, chronic A. fib on Eliquis, SBO, choledocholithiasis, presented to [facility] on 05/19/2021 with recurrent SBO and choledocholithiasis.  On 05/09/2021, patient was hospitalized at [facility] with SBO and choledocholithiasis.  SBO resolved with conservative measures and choledocholithiasis improved with ERCP and stent placement.    12/7: Underwent exploratory laparotomy, surgical resection of SBO and cholecystectomy.  Postprocedure, patient remained on vasopressors and intubated.  Extubated 12/8 and transferred to [facility] on 12/9.  12/11, noted persistent bloody drainage from NGT, general surgery and [facility] on board, s/p DDAVP and Protonix infusion.  12/12: EGD showed significant amount of blood in the stomach, lavaged and AVM bleeding, treated endoscopically.  Care transferred back from PCCM to [facility] on 12/14.  Course complicated by prolonged postop ileus, finally resolving.   Assessment & Plan:  Principal Problem:   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 05/24/2021 Active Problems:   Abdominal pain   Nausea & vomiting   Anemia   End stage renal disease (HCC)   Permanent atrial fibrillation (HCC)   Chronic systolic CHF (congestive heart failure) (HCC)   Upper GI bleed   SBO (small bowel obstruction) (HCC)   Protein-calorie malnutrition, severe   ABLA (acute blood loss anemia)   Empyema of gallbladder   Acute gangrenous cholecystitis s/p open cholcystectomy 05/24/2021    SBO due to chronic ischemic segment of SB, postop ileus: 12/7: S/p diagnostic  laparoscopy converted to exploratory laparotomies for small bowel resection and cholecystectomy for gangrenous cholecystitis and chronic ischemic segment of SB causing an obstruction.  General surgery continues to follow.  Twice daily dressing changes.  Prolonged postop ileus.  TNA for nutritional support.  Completed 5 days of Zosyn 12/12.  Course complicated by prolonged postop ileus.  General surgery managing.  DC'd PCA, JP drain and NG tube. Ongoing issues with abdominal pain limiting mobilization. WBC also up.  Defer reimaging decision to [facility] follow-up.  Remains on multimodality pain control (schedule Tylenol, as needed Oxy IR/tramadol and as needed IV Dilaudid).  Tolerating soft diet and having BMs.  Abdominal pain starting to gradually improve.  Mobilize.  Gangrenous cholecystitis: 12/7 cholecystectomy; recent Choledocholithiasis s/p recent ERCP stent placement at [facility]  Acute upper GI bleed S/p DDAVP 12/11.  [facility] on board.  S/p EGD 12/12 showed significant amount of blood in the stomach, lavaged and AVM bleeding in the stomach.  Unclear if due to trauma from NG causing this.  Treated endoscopically.  IV PPI fusion transition to PPI twice daily.  Eliquis on hold and will be resumed 12/20.  S/p PRBC transfusion 12/13 and will get a unit on 12/20 across HD.  Hemoglobin gradually drifting down (8.2 > 7.7 > 7.4 >7.1 >7), which may be due to lab draws and multifactorial rather than active GI bleed.  Upper GI bleed has clinically resolved.  Stools now returning to normal color.  ESRD: on HD tues, thurs, sat Nephrology following.  S/p HD 12/20.  Discussed with nephrology.  Hypokalemia Magnesium 2.2.  Potassium 5.5-management across HD.   Acute Blood Loss  Anemia  >> AVM GI Bleed per EGD Unsure cause - Prior EGD in 2019 showed nonbleeding gastritis, Colonoscopy showed small internal hemorrhoids, otherwise normal -Last PRBC transfusion 12/13.    - Hemoglobin gradually drifting down (8.2 >  7.7 > 7.4 >7.1 >7), which may be due to lab draws and multifactorial rather than active GI bleed.    - Continue to follow daily CBC.  Transfuse if hemoglobin 7 g or lower. -Discussed with Dr. Marval Regal who is ordered a unit of PRBC to be given across HD.  Follow CBC in AM.  Chronic Anemia: sickle cell and CKD.  Management as above.  Thrombocytopenia Resolved.   HX COPD:  - takes incruse and ellipta at home.  No clinical bronchospasm. -Continue brovana, yupelri, and pulmicort -Pulm toilet as able -Not hypoxic.   Chronic combined systolic and diastolic CHF:  - echo 3382 EF of 40 to 45% with mild right ventricular dysfunction and severe biatrial enlargement and severe mitral regurg and tricuspid regurg -Volume management across HD.  Does not appear overtly volume overloaded.  Persistent Afib/atrial flutter with RVR:  - on eliquis at home, which was held due to postop ileus and GI bleed. -CCS has cleared to resume Eliquis.  No active GI bleed clinically.  Resume Eliquis this evening.  Monitor closely.  Cardiology has signed off.  Pulm HTN -Heart Failure following -Holding home Hydralazine, Imdur, Torsemide given borderline hypotension    Sickle cell disease Plan: -No sickle cell related pain.  PCA discontinued.  On multimodality pain control.   Protein calorie malnutrition Plan: -Continue TPN  HTN: -Controlled.  Lab error: Blood sugar on BMP 12/20: 664.  This is not consistent with his previous BMPs or his CBG checks.  Likely a lab error.  RN repeated CBG and it is 94.  Unclear etiology for elevated blood sugars.    Body mass index is 21.86 kg/m.  Nutritional Status Nutrition Problem: Severe Malnutrition Etiology: acute illness (SBO) Signs/Symptoms: moderate fat depletion, moderate muscle depletion, percent weight loss (8% weight loss within 3 months) Percent weight loss: 8 % Interventions: TPN  Pressure Ulcer:     DVT prophylaxis: SCDs Start: 05/19/21 2116      Code Status: Full Code Family Communication: None at bedside. Disposition:  Status is: Inpatient  Remains inpatient appropriate because: Severity of illness including postop ileus, TNA, still working on advancing diet.        Consultants:   PCCM General surgery [facility] Nephrology AHF team  Procedures:   As above.  Antimicrobials:   As above.  Subjective:  Seen at HD.  Asking when he can go home.  Advised him that once his pain is adequately controlled on oral meds alone and continues to tolerate diet then hopefully can DC in the next couple of days.  He indicates that abdominal pain is starting to finally improved.  Tolerating soft diet.  Having BMs.  As per RN, stool returning to normal color.  Objective:   Vitals:   06/06/21 0930 06/06/21 1000 06/06/21 1016 06/06/21 1040  BP: 115/67 109/67 123/70 112/71  Pulse: 84 82 90 91  Resp:   18 14  Temp:   98.4 F (36.9 C) 98.2 F (36.8 C)  TempSrc:   Oral Oral  SpO2:   100% 100%  Weight:      Height:        General exam: Young male, moderately built and nourished lying comfortably supine in bed undergoing dialysis.  This is the best he  has looked in the last couple of days me taking care of him. Respiratory system: Clear to auscultation.  No increased work of breathing.  Right upper anterior chest Port-A-Cath. Cardiovascular system: S1 & S2 heard, irregularly irregular. No JVD, murmurs, rubs, gallops or clicks. No pedal edema. Gastrointestinal system: Abdomen is less/minimally distended, soft and nontender.  Normal bowel sounds heard.  Surgical site dressings clean and dry. Central nervous system: Alert and oriented. No focal neurological deficits. Extremities: Symmetric 5 x 5 power. Skin: No rashes, lesions or ulcers Psychiatry: Judgement and insight appear normal. Mood & affect flat.     Data Reviewed:   I have personally reviewed following labs and imaging studies   CBC: Recent Labs  Lab 05/31/21 0238  05/31/21 1518 06/04/21 0619 06/05/21 0350 06/06/21 0720  WBC 14.4*   < > 12.2* 12.4* 9.9  NEUTROABS 11.4*  --   --   --   --   HGB 7.9*   < > 7.4* 7.1* 7.0*  HCT 22.5*   < > 21.7* 21.8* 20.5*  MCV 86.9   < > 91.6 91.2 94.5  PLT 132*   < > 209 224 217   < > = values in this interval not displayed.    Basic Metabolic Panel: Recent Labs  Lab 05/31/21 0238 06/01/21 0517 06/03/21 0500 06/04/21 0619 06/05/21 0350 06/06/21 0719  NA 134* 137 137 137 134* 130*  K 3.4* 3.5 2.9* 3.9 4.0 5.5*  CL 100 101 111 104 104 100  CO2 26 25 16* 24 20* 18*  GLUCOSE 105* 108* 90 105* 96 664*  BUN 47* 43* 73* 50* 71* 89*  CREATININE 4.24* 3.40* 4.54* 3.71* 4.60* 5.23*  CALCIUM 8.3* 8.6* 7.2* 8.4* 8.6* 8.2*  MG 2.3 2.0 1.7 2.2 2.2  --   PHOS 3.7 2.6 3.6 3.5 4.4 6.5*    Liver Function Tests: Recent Labs  Lab 05/31/21 0238 06/01/21 0517 06/05/21 0350 06/06/21 0719  AST 19 23 20   --   ALT 9 13 16   --   ALKPHOS 110 161* 263*  --   BILITOT 2.1* 1.6* 1.5*  --   PROT 4.8* 5.1* 5.4*  --   ALBUMIN 1.6* 1.8* 1.7* 1.6*    CBG: Recent Labs  Lab 06/05/21 0348 06/05/21 0728 06/06/21 0914  GLUCAP 93 105* 94    Microbiology Studies:   No results found for this or any previous visit (from the past 240 hour(s)).   Radiology Studies:  No results found.  Scheduled Meds:    sodium chloride   Intravenous Once   acetaminophen  1,000 mg Oral Q6H   arformoterol  15 mcg Nebulization BID   budesonide (PULMICORT) nebulizer solution  0.25 mg Nebulization BID   Chlorhexidine Gluconate Cloth  6 each Topical Daily   darbepoetin (ARANESP) injection - DIALYSIS  200 mcg Intravenous Q Tue-HD   mouth rinse  15 mL Mouth Rinse BID   methocarbamol  1,000 mg Oral QID   pantoprazole  40 mg Intravenous Q12H   revefenacin  175 mcg Nebulization Daily   sodium chloride flush  10-40 mL Intracatheter Q12H    Continuous Infusions:    TPN CYCLIC-ADULT (ION) 23 mL/hr at 06/06/21 1033     LOS: 18 days      Vernell Leep, MD,  FACP, [facility], [facility], [facility] (Care Management Physician Certified) [place]  To contact the attending provider between 7A-7P or the covering provider during after hours 7P-7A, please log into  the web site [email or web address] and access using universal  password for that web site. If you do not have the password, please call the hospital operator.  06/06/2021, 11:03 AM

## 2021-06-06 NOTE — Procedures (Signed)
I was present at this dialysis session. I have reviewed the session itself and made appropriate changes.   Vital signs in last 24 hours:  Temp:  [98 F (36.7 C)-98.7 F (37.1 C)] 98.7 F (37.1 C) (12/20 0356) Pulse Rate:  [77-87] 83 (12/20 0715) Resp:  [15-19] 19 (12/20 0715) BP: (112-123)/(60-73) 121/60 (12/20 0356) SpO2:  [96 %-100 %] 100 % (12/20 0720) Weight change:  Filed Weights   06/03/21 1400 06/04/21 0524 06/05/21 0619  Weight: 68 kg 66.8 kg 71.9 kg    Recent Labs  Lab 06/05/21 0350  NA 134*  K 4.0  CL 104  CO2 20*  GLUCOSE 96  BUN 71*  CREATININE 4.60*  CALCIUM 8.6*  PHOS 4.4    Recent Labs  Lab 05/31/21 0238 05/31/21 1518 06/04/21 0619 06/05/21 0350 06/06/21 0720  WBC 14.4*   < > 12.2* 12.4* 9.9  NEUTROABS 11.4*  --   --   --   --   HGB 7.9*   < > 7.4* 7.1* 7.0*  HCT 22.5*   < > 21.7* 21.8* 20.5*  MCV 86.9   < > 91.6 91.2 94.5  PLT 132*   < > 209 224 217   < > = values in this interval not displayed.    Scheduled Meds:  acetaminophen  1,000 mg Oral Q6H   arformoterol  15 mcg Nebulization BID   budesonide (PULMICORT) nebulizer solution  0.25 mg Nebulization BID   Chlorhexidine Gluconate Cloth  6 each Topical Daily   darbepoetin (ARANESP) injection - DIALYSIS  200 mcg Intravenous Q Tue-HD   mouth rinse  15 mL Mouth Rinse BID   methocarbamol  1,000 mg Oral QID   pantoprazole  40 mg Intravenous Q12H   revefenacin  175 mcg Nebulization Daily   sodium chloride flush  10-40 mL Intracatheter Q12H   Continuous Infusions:  TPN CYCLIC-ADULT (ION) 46 mL/hr at 06/05/21 1817   PRN Meds:.albuterol, hydrALAZINE, HYDROmorphone (DILAUDID) injection, oxyCODONE, polyvinyl alcohol, simethicone, sodium chloride flush, traMADol   Donetta Potts,  MD 06/06/2021, 8:10 AM

## 2021-06-06 NOTE — Progress Notes (Signed)
Physical Therapy Treatment Patient Details Name: Patrick [name] MRN: [id number] DOB: 05-07-1956 Today's Date: 06/06/2021   History of Present Illness 65 y.o. male admitted 12/2 with SBO. Pt recently hospitalized 11/22-11/26 at [facility] in [place] for same reason s/p ERCP. 12/7 pt to OR for small bowel resection and open cholecystectomy without closure and remained on vent, extubated 12/8. 12/12: EGD showed significant amount of blood in the stomach, lavaged and AVM bleeding, treated endoscopically. PMHx: ESRD  on HD, anemia of CKD, CHF, Afib    PT Comments    Pt agreeable to participate. Ambulating 100 feet with no assistive device at a supervision level. HR up to 108 bpm. Pt deferring further distance and upon return to room reporting high pain levels and requesting pain medications. Pt states, "whenever I get my pain under control, y'all come in here and make me move." If pt goal is for home, will need to have family assist I.e. cousin for IADL's and BID dressing changes.     Recommendations for follow up therapy are one component of a multi-disciplinary discharge planning process, led by the attending physician.  Recommendations may be updated based on patient status, additional functional criteria and insurance authorization.  Follow Up Recommendations  Skilled nursing-short term rehab (<3 hours/day)     Assistance Recommended at Discharge Intermittent Supervision/Assistance  Equipment Recommendations  BSC/3in1    Recommendations for Other Services       Precautions / Restrictions Precautions Precautions: Fall;Other (comment) Precaution Comments: low vision; abdominal precautions for comfort Restrictions Weight Bearing Restrictions: No     Mobility  Bed Mobility Overal bed mobility: Modified Independent                  Transfers Overall transfer level: Independent Equipment used: None                    Ambulation/Gait Ambulation/Gait  assistance: Supervision Gait Distance (Feet): 100 Feet Assistive device: None Gait Pattern/deviations: Step-through pattern       General Gait Details: verbal cues for environmental negotiation, slowed step through gait   Stairs             Wheelchair Mobility    Modified Rankin (Stroke Patients Only)       Balance Overall balance assessment: Needs assistance Sitting-balance support: No upper extremity supported;Feet supported Sitting balance-Leahy Scale: Good     Standing balance support: No upper extremity supported Standing balance-Leahy Scale: Good                              Cognition Arousal/Alertness: Awake/alert Behavior During Therapy: WFL for tasks assessed/performed Overall Cognitive Status: Within Functional Limits for tasks assessed                                          Exercises      General Comments General comments (skin integrity, edema, etc.): HR up to 108 bpm      Pertinent Vitals/Pain Pain Assessment: 0-10 Pain Score: 10-Worst pain ever Pain Location: abdomen Pain Descriptors / Indicators: Grimacing;Guarding Pain Intervention(s): Limited activity within patient's tolerance;Monitored during session;Patient requesting pain meds-RN notified    Home Living                          Prior Function  PT Goals (current goals can now be found in the care plan section) Acute Rehab PT Goals Patient Stated Goal: less pain Potential to Achieve Goals: Fair Progress towards PT goals: Progressing toward goals    Frequency    Min 3X/week      PT Plan Current plan remains appropriate    Co-evaluation              AM-PAC PT "6 Clicks" Mobility   Outcome Measure  Help needed turning from your back to your side while in a flat bed without using bedrails?: None Help needed moving from lying on your back to sitting on the side of a flat bed without using bedrails?: None Help  needed moving to and from a bed to a chair (including a wheelchair)?: None Help needed standing up from a chair using your arms (e.g., wheelchair or bedside chair)?: None Help needed to walk in hospital room?: A Little Help needed climbing 3-5 steps with a railing? : A Little 6 Click Score: 22    End of Session   Activity Tolerance: Patient tolerated treatment well Patient left: in bed;with call bell/phone within reach Nurse Communication: Mobility status PT Visit Diagnosis: Muscle weakness (generalized) (M62.81);Difficulty in walking, not elsewhere classified (R26.2);Other abnormalities of gait and mobility (R26.89)     Time: 4818-5631 PT Time Calculation (min) (ACUTE ONLY): 14 min  Charges:  $Therapeutic Activity: 8-22 mins                     Wyona Almas, PT, DPT Acute Rehabilitation Services Pager [phone] Office [phone]    Deno Etienne 06/06/2021, 5:10 PM

## 2021-06-06 NOTE — Progress Notes (Signed)
°   06/06/21 1138  Vitals  Temp 98.2 F (36.8 C)  Temp Source Oral  BP 125/75  BP Location Left Arm  BP Method Automatic  Patient Position (if appropriate) Lying  Pulse Rate 90  Pulse Rate Source Monitor  Resp 15  Oxygen Therapy  SpO2 96 %  O2 Device Room Air  Post-Hemodialysis Assessment  Rinseback Volume (mL) 250 mL  KECN 286 V  Dialyzer Clearance Lightly streaked  Duration of HD Treatment -hour(s) 3.5 hour(s)  Hemodialysis Intake (mL) 800 mL  UF Total -Machine (mL) 3000 mL  Net UF (mL) 2200 mL  Tolerated HD Treatment Yes  Post-Hemodialysis Comments tx complete-pt stable  AVG/AVF Arterial Site Held (minutes) 10 minutes  AVG/AVF Venous Site Held (minutes) 10 minutes  Fistula / Graft Right Upper arm  No placement date or time found.   Orientation: Right  Access Location: Upper arm  Site Condition No complications  Fistula / Graft Assessment Present;Thrill;Bruit  Status Deaccessed;Flushed   HD tx complete, pt stable. Transfused 1 unit PRBC without incident. No problems noted.

## 2021-06-06 NOTE — Progress Notes (Signed)
Lab called with a critical value for glucose of 664. RN let MD know and he wants a finger stick done to recheck it. PT is currently off the floor at HD. RN let HD RN know.   HD RN rechecked CBG and it was 94. MD notified.

## 2021-06-06 NOTE — Progress Notes (Signed)
Nutrition Follow-up  DOCUMENTATION CODES:   Severe malnutrition in context of acute illness/injury  INTERVENTION:   - Boost Breeze po TID, each supplement provides 250 kcal and 9 grams of protein  - Magic Cup TID with meals, each supplement provides 290 kcal and 9 grams of protein  - Encourage PO intake  NUTRITION DIAGNOSIS:   Severe Malnutrition related to acute illness (SBO) as evidenced by moderate fat depletion, moderate muscle depletion, percent weight loss (8% weight loss within 3 months).  Ongoing, being addressed via diet advancement and oral nutrition supplements  GOAL:   Patient will meet greater than or equal to 90% of their needs  Progressing  MONITOR:   Labs, I & O's, Diet advancement  REASON FOR ASSESSMENT:   Consult New TPN/TNA  ASSESSMENT:   65 yo male admitted with SBO. PMH includes ESRD on HD, recent SBO (hospitalized at [facility] in [place] 1/22-11/26), CHF, global hypokinesis, mitral regurgitation, legally blind, sickle cell disease.  12/06 - TPN start 12/07 - s/p diagnostic laparoscopy, SBR with primary anastomosis, open cholecystectomy 12/08 - extubated 12/11 - persistent bloody drainage from NG tube 12/12 - s/p upper endoscopy showing ulcerative esophagitis, actively bleeding gastric AVM s/p APC and clip 12/13 - NG tube clamped, later put back to suction 12/14 - ongoing NG tube clamping trials 12/15 - sips of clears from floor 12/16 - diet advanced to clear liquids 12/17 - diet advanced to full liquids 12/18 - diet advanced to GI soft, TPN cycled 23/55 - cyclic TPN rate decreased to meet ~50% of needs 12/20 - TPN weaned off  No meal completions charted since diet was advanced on 12/16.  Pt had HD this morning with 2200 ml net UF. Post-HD weight was 70 kg which is above EDW of 65 kg.  Spoke with pt at bedside. TPN weaned off today. Pt reporting currently experiencing abdominal pain. Noted ~25% completed lunch meal tray at bedside.  Pt states that he has more pain after eating and drinking. Pt reports that liquids "run right through me." Discussed plan to wean TPN. Discussed with pt that he will now need to meet his nutritional needs via PO intake alone. Pt willing to try Boost Breeze and Magic Cups to aid in meeting kcal and protein needs.  EDW: 65 kg Admit weight: 67.9 kg Current weight: 70 kg  Medications reviewed and include: aranesp weekly, IV protonix, cyclic TPN  Labs reviewed: sodium 130, potassium 5.5, phosphorus 6.5, hemoglobin 7.0 CBG's: 94  UOP: 150 ml x 24 hours I/O's: +2.7 L since admit  Diet Order:   Diet Order             DIET SOFT Room service appropriate? Yes; Fluid consistency: Thin  Diet effective now                   EDUCATION NEEDS:   Not appropriate for education at this time  Skin:  Skin Assessment: Skin Integrity Issues: Incisions: abdomen  Last BM:  06/04/21 medium type 6  Height:   Ht Readings from Last 1 Encounters:  05/29/21 5\' 11"  (1.803 m)    Weight:   Wt Readings from Last 1 Encounters:  06/06/21 70 kg    BMI:  Body mass index is 21.52 kg/m.  Estimated Nutritional Needs:   Kcal:  1900-2100  Protein:  100-115 gm  Fluid:  1.9 L    Gustavus Bryant, MS, RD, LDN Inpatient Clinical Dietitian Please see AMiON for contact information.

## 2021-06-06 NOTE — Progress Notes (Signed)
CBG-94 at this time.

## 2021-06-07 ENCOUNTER — Inpatient Hospital Stay (HOSPITAL_COMMUNITY): Payer: HMO

## 2021-06-07 LAB — TYPE AND SCREEN
ABO/RH(D): AB POS
Antibody Screen: NEGATIVE
Unit division: 0

## 2021-06-07 LAB — CBC
HCT: 22.8 % — ABNORMAL LOW (ref 39.0–52.0)
Hemoglobin: 7.6 g/dL — ABNORMAL LOW (ref 13.0–17.0)
MCH: 29.9 pg (ref 26.0–34.0)
MCHC: 33.3 g/dL (ref 30.0–36.0)
MCV: 89.8 fL (ref 80.0–100.0)
Platelets: 247 10*3/uL (ref 150–400)
RBC: 2.54 MIL/uL — ABNORMAL LOW (ref 4.22–5.81)
RDW: 18 % — ABNORMAL HIGH (ref 11.5–15.5)
WBC: 9.3 10*3/uL (ref 4.0–10.5)
nRBC: 0.8 % — ABNORMAL HIGH (ref 0.0–0.2)

## 2021-06-07 LAB — BPAM RBC
Blood Product Expiration Date: 202212312359
ISSUE DATE / TIME: 202212201021
Unit Type and Rh: 6200

## 2021-06-07 LAB — BASIC METABOLIC PANEL
Anion gap: 10 (ref 5–15)
BUN: 45 mg/dL — ABNORMAL HIGH (ref 8–23)
CO2: 23 mmol/L (ref 22–32)
Calcium: 8.3 mg/dL — ABNORMAL LOW (ref 8.9–10.3)
Chloride: 97 mmol/L — ABNORMAL LOW (ref 98–111)
Creatinine, Ser: 4.82 mg/dL — ABNORMAL HIGH (ref 0.61–1.24)
GFR, Estimated: 13 mL/min — ABNORMAL LOW (ref >60)
Glucose, Bld: 104 mg/dL — ABNORMAL HIGH (ref 70–99)
Potassium: 4.2 mmol/L (ref 3.5–5.1)
Sodium: 130 mmol/L — ABNORMAL LOW (ref 135–145)

## 2021-06-07 LAB — MAGNESIUM: Magnesium: 1.8 mg/dL (ref 1.7–2.4)

## 2021-06-07 MED ORDER — FENTANYL CITRATE PF 50 MCG/ML IJ SOSY
12.5000 ug | PREFILLED_SYRINGE | Freq: Once | INTRAMUSCULAR | Status: AC
  Administered 2021-06-07: 14:00:00 12.5 ug via INTRAVENOUS
  Filled 2021-06-07: qty 1

## 2021-06-07 MED ORDER — HYDROMORPHONE HCL 1 MG/ML IJ SOLN
0.5000 mg | INTRAMUSCULAR | Status: DC | PRN
  Administered 2021-06-07 – 2021-06-10 (×8): 0.5 mg via INTRAVENOUS
  Filled 2021-06-07 (×9): qty 0.5

## 2021-06-07 MED ORDER — OXYCODONE HCL 5 MG PO TABS
5.0000 mg | ORAL_TABLET | Freq: Four times a day (QID) | ORAL | Status: DC | PRN
Start: 2021-06-07 — End: 2021-06-10
  Administered 2021-06-07 – 2021-06-10 (×9): 10 mg via ORAL
  Filled 2021-06-07 (×9): qty 2

## 2021-06-07 NOTE — Progress Notes (Signed)
PROGRESS NOTE    Patrick [name]  OVF:[id number] DOB: 1956/02/25 DOA: 05/19/2021 PCP: Dorena Dew, FNP    Chief Complaint  Patient presents with   Small Bowel Obstruction    Brief Narrative:   H/o sickle cell anemia,,  A. fib on Eliquis, combined CHF, ESRD on HD TTS who was recently hospitalized in [place] for Choledocholithiasis w recent ERCP/Sphinct/CBD and PD stenting/stone extractio, he declined cholecystectomy at that time., he presented to the [facility] ED on 12/2 due to abdominal pain, found to have SBO and gangrenous cholecystitis S/p lap, cholecystectomy and SB resection 12/07, post op transferred to icu on vent, Extubated 12/8 , transferred to [facility] on 12/9   12/11, noted persistent bloody drainage from NGT, general surgery and [facility] on board, s/p DDAVP and Protonix infusion.  transferred to icu due to ongoing bleeding 12/12: EGD showed significant amount of blood in the stomach, lavaged and AVM bleeding, treated endoscopically.  Care transferred back from [facility] to [facility] on 12/14.    Course complicated by prolonged postop ileus, finally resolving  Subjective:  C/o ab pain, states dilaudid did not help No n/v, reports had bm today, + flatus   Assessment & Plan:   Principal Problem:   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 05/24/2021 Active Problems:   Abdominal pain   Nausea & vomiting   Anemia   End stage renal disease (HCC)   Permanent atrial fibrillation (HCC)   Chronic systolic CHF (congestive heart failure) (HCC)   Upper GI bleed   SBO (small bowel obstruction) (HCC)   Protein-calorie malnutrition, severe   ABLA (acute blood loss anemia)   Empyema of gallbladder   Acute gangrenous cholecystitis s/p open cholcystectomy 05/24/2021   SBO/gangrenous cholecystitis/post op ileus  -S/p Diagnostic laparoscopy,  Small bowel resection with primary anastomosis,   Open cholecystectomy on 12/7 -was on ng suction and tpn -he has improved, now  tolerating diet, having bm and + flatus -Plan per general surgery   Acute upper GI bleed -Noted to have significant amount of blood coming out from NG tube on 12/11 -Transfer to ICU, had a EGD on 12/12showed significant amount of blood in the stomach, lavaged and AVM bleeding in the stomach.  Unclear if due to trauma from NG causing this.  Treated endoscopically.  IV PPI fusion transition to PPI twice daily.  Eliquis on hold and will be resumed 12/20 -Monitor hemoglobin, reports stool is returning to normal color  Afib Remain in afib, rate controlled  Was On and off amiodarone drip for rate control, currently not on any rate control agent Anticoagulation held due to anemia and recent surgery, resumed on 12/20 Cardiology was following, signed off on 12/19 Consider resume low dose coreg if bp able to tolerate   Chronic combined chf/Pulmonary HTN -Echo this admit suggestive of restrictive CM with EF 40-45%, RV mildly decreased, severe MR, severe TR, RVSP 55 mmHg -Volume managed by dialysis Cardiology/heart failure team was following, signed off on 12/19   HTN: bp stable off home bp meds, consider resume Coreg if BP able to tolerate   ESRD Electrolytes abnormalities  HD per Nephrology    Acute blood loss anemia / history of sickle cell anemia and anemia of chronic disease Prbcx10units so far FFP x2 on 12/7 Monitor cbc     Sickle cell anemia Home medication Hydrea on hold due to n.p.o. status   Legally blind, reports lives by himself   COPD stable on Brovana ,Pulmicort ,yupelri,  quit smoking in 2017 No wheezing on exam, no cough     Nutritional Assessment:  The patients BMI is: Body mass index is 21.34 kg/m.Marland Kitchen  Seen by dietician.  I agree with the assessment and plan as outlined below:  Nutrition Status: Nutrition Problem: Severe Malnutrition Etiology: acute illness (SBO) Signs/Symptoms: moderate fat depletion, moderate muscle depletion, percent weight loss (8% weight  loss within 3 months) Percent weight loss: 8 % Interventions: TPN  .    Unresulted Labs (From admission, onward)     Start     Ordered   06/08/21 0500  Renal function panel  Tomorrow morning,   R       Question:  Specimen collection method  Answer:  Unit=Unit collect   06/07/21 1008   06/08/21 0500  CBC  Tomorrow morning,   R       Question:  Specimen collection method  Answer:  Unit=Unit collect   06/07/21 1008   06/07/21 1351  Magnesium  ONCE - STAT,   STAT       Question:  Specimen collection method  Answer:  Unit=Unit collect   06/07/21 1351   06/07/21 4403  Basic metabolic panel  ONCE - STAT,   STAT       Question:  Specimen collection method  Answer:  Unit=Unit collect   06/07/21 1351   Signed and Held  Renal function panel  Once,   R       Question:  Specimen collection method  Answer:  Unit=Unit collect   Signed and Held   Signed and Held  CBC  Once,   R       Question:  Specimen collection method  Answer:  Unit=Unit collect   Signed and Held              DVT prophylaxis: SCDs Start: 05/19/21 2116 apixaban (ELIQUIS) tablet 5 mg   Code Status:full Family Communication: Patient Disposition:   Status is: Inpatient  Dispo: The patient is from: Home              Anticipated d/c is to: Initially agreed to skilled nursing facility, now state want to go home              Anticipated d/c date is: TBD                Consultants:  Cardiology Critical care GI General surgery Nephrology  Procedures:  See above  Antimicrobials:   Anti-infectives (From admission, onward)    Start     Dose/Rate Route Frequency Ordered Stop   05/24/21 1730  piperacillin-tazobactam (ZOSYN) IVPB 2.25 g  Status:  Discontinued        2.25 g 100 mL/hr over 30 Minutes Intravenous Every 8 hours 05/24/21 1654 05/29/21 1303   05/24/21 1615  metroNIDAZOLE (FLAGYL) IVPB 500 mg       Note to Pharmacy: Please tube to #[phone]    500 mg 100 mL/hr over 60 Minutes Intravenous  Once 05/24/21  1603 05/24/21 1630           Objective: Vitals:   06/07/21 0642 06/07/21 0755 06/07/21 0800 06/07/21 1059  BP:   121/76 112/71  Pulse:  95 96 97  Resp:  20 15 15   Temp:   98.5 F (36.9 C) 97.8 F (36.6 C)  TempSrc:   Axillary Oral  SpO2:   100% 100%  Weight: 69.4 kg     Height:        Intake/Output Summary (Last  24 hours) at 06/07/2021 1405 Last data filed at 06/06/2021 2332 Gross per 24 hour  Intake --  Output 151 ml  Net -151 ml   Filed Weights   06/06/21 0750 06/06/21 1144 06/07/21 0642  Weight: 71.1 kg 70 kg 69.4 kg    Examination:  General exam: alert, awake, communicative,calm, NAD, legally blind Respiratory system: Clear to auscultation. Respiratory effort normal. Cardiovascular system:  RRR.  Gastrointestinal system: Abdomen post op changes, dressing intact,  bowel sounds heard, not very active though Central nervous system: Alert and oriented. No focal neurological deficits. Extremities:  no edema Skin: No rashes, lesions or ulcers Psychiatry: Judgement and insight appear normal. Mood & affect appropriate.     Data Reviewed: I have personally reviewed following labs and imaging studies  CBC: Recent Labs  Lab 06/03/21 0335 06/04/21 0619 06/05/21 0350 06/06/21 0720 06/07/21 0900  WBC 10.7* 12.2* 12.4* 9.9 9.3  HGB 7.7* 7.4* 7.1* 7.0* 7.6*  HCT 23.4* 21.7* 21.8* 20.5* 22.8*  MCV 95.9 91.6 91.2 94.5 89.8  PLT 224 209 224 217 427    Basic Metabolic Panel: Recent Labs  Lab 06/01/21 0517 06/03/21 0500 06/04/21 0619 06/05/21 0350 06/06/21 0719  NA 137 137 137 134* 130*  K 3.5 2.9* 3.9 4.0 5.5*  CL 101 111 104 104 100  CO2 25 16* 24 20* 18*  GLUCOSE 108* 90 105* 96 664*  BUN 43* 73* 50* 71* 89*  CREATININE 3.40* 4.54* 3.71* 4.60* 5.23*  CALCIUM 8.6* 7.2* 8.4* 8.6* 8.2*  MG 2.0 1.7 2.2 2.2  --   PHOS 2.6 3.6 3.5 4.4 6.5*    GFR: Estimated Creatinine Clearance: 14.2 mL/min (A) (by C-G formula based on SCr of 5.23 mg/dL  (H)).  Liver Function Tests: Recent Labs  Lab 06/01/21 0517 06/05/21 0350 06/06/21 0719  AST 23 20  --   ALT 13 16  --   ALKPHOS 161* 263*  --   BILITOT 1.6* 1.5*  --   PROT 5.1* 5.4*  --   ALBUMIN 1.8* 1.7* 1.6*    CBG: Recent Labs  Lab 06/04/21 2018 06/04/21 2328 06/05/21 0348 06/05/21 0728 06/06/21 0914  GLUCAP 119* 118* 93 105* 94     No results found for this or any previous visit (from the past 240 hour(s)).       Radiology Studies: No results found.      Scheduled Meds:  acetaminophen  1,000 mg Oral Q6H   apixaban  5 mg Oral BID   arformoterol  15 mcg Nebulization BID   budesonide (PULMICORT) nebulizer solution  0.25 mg Nebulization BID   Chlorhexidine Gluconate Cloth  6 each Topical Daily   darbepoetin (ARANESP) injection - DIALYSIS  200 mcg Intravenous Q Tue-HD   feeding supplement  1 Container Oral TID BM   fentaNYL (SUBLIMAZE) injection  12.5 mcg Intravenous Once   mouth rinse  15 mL Mouth Rinse BID   methocarbamol  1,000 mg Oral QID   pantoprazole  40 mg Intravenous Q12H   revefenacin  175 mcg Nebulization Daily   sodium chloride flush  10-40 mL Intracatheter Q12H   Continuous Infusions:   LOS: 19 days   Time spent: 26mins Greater than 50% of this time was spent in counseling, explanation of diagnosis, planning of further management, and coordination of care.   Voice Recognition Viviann Spare dictation system was used to create this note, attempts have been made to correct errors. Please contact the author with questions and/or clarifications.   Florencia Reasons,  MD PhD FACP [facility]  Available via Epic secure chat 7am-7pm for nonurgent issues Please page for urgent issues To page the attending provider between 7A-7P or the covering provider during after hours 7P-7A, please log into the web site [email or web address] and access using universal [place] password for that web site. If you do not have the password, please call the hospital  operator.    06/07/2021, 2:05 PM

## 2021-06-07 NOTE — Progress Notes (Signed)
Patient ID: Patrick [name], male   DOB: Dec 21, 1955, 65 y.o.   MRN: [id number] S: Feeling better today and able to take po. O:BP 121/76 (BP Location: Left Arm)    Pulse 96    Temp 98.5 F (36.9 C) (Axillary)    Resp 15    Ht 5\' 11"  (1.803 m)    Wt 69.4 kg    SpO2 100%    BMI 21.34 kg/m   Intake/Output Summary (Last 24 hours) at 06/07/2021 1002 Last data filed at 06/06/2021 2332 Gross per 24 hour  Intake 310 ml  Output 2351 ml  Net -2041 ml   Intake/Output: I/O last 3 completed shifts: In: 310 [I.V.:10; Blood:300] Out: 2501 [Urine:300; Other:2200; Stool:1]  Intake/Output this shift:  No intake/output data recorded. Weight change:  Gen:NAD CVS: RRR Resp:CTA Abd: +BS, distended, mild tenderness to palpation Ext: no edema, RUE AVF +T/B  Recent Labs  Lab 06/01/21 0517 06/03/21 0500 06/04/21 0619 06/05/21 0350 06/06/21 0719  NA 137 137 137 134* 130*  K 3.5 2.9* 3.9 4.0 5.5*  CL 101 111 104 104 100  CO2 25 16* 24 20* 18*  GLUCOSE 108* 90 105* 96 664*  BUN 43* 73* 50* 71* 89*  CREATININE 3.40* 4.54* 3.71* 4.60* 5.23*  ALBUMIN 1.8*  --   --  1.7* 1.6*  CALCIUM 8.6* 7.2* 8.4* 8.6* 8.2*  PHOS 2.6 3.6 3.5 4.4 6.5*  AST 23  --   --  20  --   ALT 13  --   --  16  --    Liver Function Tests: Recent Labs  Lab 06/01/21 0517 06/05/21 0350 06/06/21 0719  AST 23 20  --   ALT 13 16  --   ALKPHOS 161* 263*  --   BILITOT 1.6* 1.5*  --   PROT 5.1* 5.4*  --   ALBUMIN 1.8* 1.7* 1.6*   No results for input(s): LIPASE, AMYLASE in the last 168 hours. No results for input(s): AMMONIA in the last 168 hours. CBC: Recent Labs  Lab 06/03/21 0335 06/04/21 0619 06/05/21 0350 06/06/21 0720 06/07/21 0900  WBC 10.7* 12.2* 12.4* 9.9 PENDING  HGB 7.7* 7.4* 7.1* 7.0* 7.6*  HCT 23.4* 21.7* 21.8* 20.5* 22.8*  MCV 95.9 91.6 91.2 94.5 89.8  PLT 224 209 224 217 247   Cardiac Enzymes: No results for input(s): CKTOTAL, CKMB, CKMBINDEX, TROPONINI in the last 168 hours. CBG: Recent Labs   Lab 06/04/21 2018 06/04/21 2328 06/05/21 0348 06/05/21 0728 06/06/21 0914  GLUCAP 119* 118* 93 105* 94    Iron Studies: No results for input(s): IRON, TIBC, TRANSFERRIN, FERRITIN in the last 72 hours. Studies/Results: No results found.  acetaminophen  1,000 mg Oral Q6H   apixaban  5 mg Oral BID   arformoterol  15 mcg Nebulization BID   budesonide (PULMICORT) nebulizer solution  0.25 mg Nebulization BID   Chlorhexidine Gluconate Cloth  6 each Topical Daily   darbepoetin (ARANESP) injection - DIALYSIS  200 mcg Intravenous Q Tue-HD   feeding supplement  1 Container Oral TID BM   mouth rinse  15 mL Mouth Rinse BID   methocarbamol  1,000 mg Oral QID   pantoprazole  40 mg Intravenous Q12H   revefenacin  175 mcg Nebulization Daily   sodium chloride flush  10-40 mL Intracatheter Q12H    BMET    Component Value Date/Time   NA 130 (L) 06/06/2021 0719   NA 135 05/24/2020 1119   K 5.5 (H) 06/06/2021 0719  CL 100 06/06/2021 0719   CO2 18 (L) 06/06/2021 0719   GLUCOSE 664 (HH) 06/06/2021 0719   BUN 89 (H) 06/06/2021 0719   BUN 25 05/24/2020 1119   CREATININE 5.23 (H) 06/06/2021 0719   CREATININE 2.90 (H) 08/11/2015 1359   CALCIUM 8.2 (L) 06/06/2021 0719   GFRNONAA 12 (L) 06/06/2021 0719   GFRNONAA 23 (L) 08/11/2015 1359   GFRAA 24 (L) 05/24/2020 1119   GFRAA 27 (L) 08/11/2015 1359   CBC    Component Value Date/Time   WBC PENDING 06/07/2021 0900   RBC 2.54 (L) 06/07/2021 0900   HGB 7.6 (L) 06/07/2021 0900   HGB 9.1 (L) 05/24/2020 1119   HCT 22.8 (L) 06/07/2021 0900   HCT 25.6 (L) 05/24/2020 1119   PLT 247 06/07/2021 0900   PLT 88 (LL) 05/24/2020 1119   MCV 89.8 06/07/2021 0900   MCV 124 (H) 05/24/2020 1119   MCH 29.9 06/07/2021 0900   MCHC 33.3 06/07/2021 0900   RDW 18.0 (H) 06/07/2021 0900   RDW 14.6 05/24/2020 1119   LYMPHSABS 0.6 (L) 05/31/2021 0238   LYMPHSABS 0.9 05/24/2020 1119   MONOABS 1.7 (H) 05/31/2021 0238   EOSABS 0.4 05/31/2021 0238   EOSABS 0.1  05/24/2020 1119   BASOSABS 0.1 05/31/2021 0238   BASOSABS 0.1 05/24/2020 1119   OP HD: TTS AF   4h  400/500   65kg  2/2 bath  P2  AVF  Hep none - Sensipar 30mg  PO q HD - Venofer 50mg  IV weekly - Mircera 282mcg IV q 2 weeks (last 11/8)   Assessment/ Plan SBO/ sp ex lap w/ cholecystectomy and SB resection - on 12/07, done for gangrenous GB and chronic SB ischemia. Having BM's and advancing diet but reports poor appetite and some abdominal pain today.  Off of TPN and now taking solids po without N/V.  Off of abx and awaiting WBC and if rising repeat CT of abdomen.   SP recent gallstone with CBD &pancreatic duct stenting UGIB - d/t AVM in stomach, rx'd by GI w/ APC/ endoclips/ epi sq on 12/12  ESRD: usual HD TTS. Continue with outpatient HD schedule.  HTN/volume: BP stable, 2.5 L off on HD, wt up to 72kg today (7kg above edw), will get standing weight before HD tomorrow.  Anemia CKD/ sickle cell/ abl-  Aranesp with HD weekly on Wed, last 12/14.  Tsat 34%, no Fe needed. Hb 8's now, sp 9u prbc's total here.  Hypokalemia - up to 3.5 yesterday  Metabolic bone disease: Ca/Phos ok - resume binders when eating  Nutrition: on TPN, NG out, advancing diet  A-fib: per cardiology  Disposition - hopeful discharge to home soon.   Donetta Potts, MD [employer] [phone]

## 2021-06-07 NOTE — TOC Initial Note (Signed)
Transition of Care [facility]) - Initial/Assessment Note    Patient Details  Name: Patrick [name] MRN: [id number] Date of Birth: [birth year]  Transition of Care [facility]) CM/SW Contact:    Erenest Rasher, RN Phone Number: [phone] 06/07/2021, 4:12 PM  Clinical Narrative:                 HF TOC CM spoke to pt at bedside. Offered choice for [facility]. Pt agreeable to agency that will accept insurance. Pt states he will dc home with cousin, [name]. States cousin works from home. Contacted [facility] with new referral. Will need [place] RN orders with F2F and instructions on dressing changes. Jonnie Finner RN3 CCM, Heart Failure TOC CM [phone]   Expected Discharge Plan: [place] Barriers to Discharge: Continued Medical Work up   Patient Goals and CMS Choice Patient states their goals for this hospitalization and ongoing recovery are:: wants to go home CMS Medicare.gov Compare Post Acute Care list provided to:: Patient Choice offered to / list presented to : Patient  Expected Discharge Plan and Services Expected Discharge Plan: [place] In-house Referral: Clinical Social Work Discharge Planning Services: CM Consult Post Acute Care Choice: [place] arrangements for the past 2 months: Apartment                           HH Arranged: RN          Prior Living Arrangements/Services Living arrangements for the past 2 months: Apartment Lives with:: Relatives Patient language and need for interpreter reviewed:: Yes Do you feel safe going back to the place where you live?: Yes   SNF  Need for Family Participation in Patient Care: Yes (Comment) Care giver support system in place?: Yes (comment)   Criminal Activity/Legal Involvement Pertinent to Current Situation/Hospitalization: No - Comment as needed  Activities of Daily Living Home Assistive Devices/Equipment: None ADL Screening (condition at time of admission) Patient's  cognitive ability adequate to safely complete daily activities?: Yes Is the patient deaf or have difficulty hearing?: No Does the patient have difficulty seeing, even when wearing glasses/contacts?: Yes Does the patient have difficulty concentrating, remembering, or making decisions?: No Patient able to express need for assistance with ADLs?: Yes Does the patient have difficulty dressing or bathing?: Yes Independently performs ADLs?: No Communication: Independent Dressing (OT): Needs assistance Is this a change from baseline?: Pre-admission baseline Grooming: Needs assistance Is this a change from baseline?: Pre-admission baseline Feeding: Needs assistance Is this a change from baseline?: Pre-admission baseline Bathing: Needs assistance Is this a change from baseline?: Pre-admission baseline Toileting: Needs assistance Is this a change from baseline?: Pre-admission baseline In/Out Bed: Needs assistance Is this a change from baseline?: Pre-admission baseline Walks in Home: Needs assistance Is this a change from baseline?: Pre-admission baseline Does the patient have difficulty walking or climbing stairs?: No Weakness of Legs: Both Weakness of Arms/Hands: None  Permission Sought/Granted Permission sought to share information with : Case Manager, Family Supports, PCP Permission granted to share information with : Yes, Verbal Permission Granted  Share Information with NAME: [name]  Permission granted to share info w AGENCY: [place] granted to share info w Relationship: cousin  Permission granted to share info w Contact Information: [phone]  Emotional Assessment Appearance:: Appears stated age Attitude/Demeanor/Rapport: Gracious, Engaged Affect (typically observed): Accepting Orientation: : Oriented to Self, Oriented to Place, Oriented to  Time, Oriented  to Situation Alcohol / Substance Use: Not Applicable Psych Involvement: No (comment)  Admission  diagnosis:  SBO (small bowel obstruction) ([place]) [K56.609] Patient Active Problem List   Diagnosis Date Noted   Empyema of gallbladder 05/27/2021   Acute gangrenous cholecystitis s/p open cholcystectomy 05/24/2021 05/27/2021   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 05/24/2021 05/27/2021   ABLA (acute blood loss anemia)    Protein-calorie malnutrition, severe 05/23/2021   SBO (small bowel obstruction) ([place]) 05/19/2021   Acute upper GI bleed 08/02/2020   Prolonged QT interval 08/02/2020   Upper GI bleed 08/02/2020   Hematemesis with nausea    Chronic anticoagulation    Tobacco dependence 05/28/2020   Muscle spasm of back 15/37/9432   Chronic systolic CHF (congestive heart failure) ([place]) 09/11/2019   Hypoxia 06/02/2018   Symptomatic anemia    Chronic heart failure with preserved ejection fraction ([place])    Colon cancer screening 07/16/2017   Encounter for therapeutic drug monitoring 12/29/2015   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   Permanent atrial fibrillation ([place]) 12/24/2015   Cough with sputum 10/25/2015   Muscle tightness-Right arm  05/27/2014   Arm pain, right 05/06/2014   End stage renal disease ([place]) 05/06/2014   Chest pain 01/13/2014   Vitamin D deficiency 09/25/2013   CKD (chronic kidney disease), stage IV ([place]) 09/25/2013   Elevated uric acid in blood 09/25/2013   Hb-SS disease without crisis ([place]) 09/25/2013   Anemia 09/25/2013   Onychomycosis of toenail 08/27/2013   Cellulitis 08/27/2013   Hyperglycemia 08/27/2013   CHF (congestive heart failure) ([place]) 08/19/2013   Nausea & vomiting 02/07/2013   Sickle cell anemia ([place]) 11/21/2012   Abdominal pain 01/17/2012   NSVT (nonsustained ventricular tachycardia) 10/21/2011   Gout 10/19/2011   Sickle cell crisis ([place]) 10/17/2011   Acute on chronic systolic and diastolic heart failure, NYHA class 4 ([place]) 10/17/2011   Essential hypertension 06/07/2011   PCP:  Dorena Dew, FNP Pharmacy:   [id number] [place], [place] [address] [place] [address] Phone: [phone] Fax: [phone]     Social Determinants of Health (SDOH) Interventions Food Insecurity Interventions: Intervention Not Indicated Financial Strain Interventions: Intervention Not Indicated Housing Interventions: Intervention Not Indicated 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

## 2021-06-07 NOTE — Progress Notes (Signed)
Mobility Specialist Progress Note   06/07/21 1800  Mobility  Activity Refused mobility   Pt reporting to being in too much abdominal pain and requesting to work tomorrow. RN notified and will f/u tomorrow on progress.   Holland Falling Mobility Specialist Phone Number [phone]

## 2021-06-07 NOTE — Progress Notes (Signed)
Progress Note  9 Days Post-Op  Subjective: Continues to complain of intermittently excruciating, diffuse abdominal pain. Cannot describe quality. Tolerating PO without nausea or emesis, and having bowel function  Objective: Vital signs in last 24 hours: Temp:  [98 F (36.7 C)-99.2 F (37.3 C)] 99 F (37.2 C) (12/21 0359) Pulse Rate:  [77-92] 89 (12/21 0359) Resp:  [13-20] 20 (12/21 0359) BP: (104-127)/(60-75) 107/66 (12/21 0359) SpO2:  [95 %-100 %] 95 % (12/21 0359) Weight:  [69.4 kg-71.1 kg] 69.4 kg (12/21 0642) Last BM Date: 06/04/21  Intake/Output from previous day: 12/20 0701 - 12/21 0700 In: 310 [I.V.:10; Blood:300] Out: 2351 [Urine:150; Stool:1] Intake/Output this shift: No intake/output data recorded.  PE: General: NAD Abd: soft, appropriately tender. Mildly distended.  Wound clean and dry with some dry fat necrosis at the upper aspect but otherwise granulating and shallow.    Lab Results:  Recent Labs    06/05/21 0350 06/06/21 0720  WBC 12.4* 9.9  HGB 7.1* 7.0*  HCT 21.8* 20.5*  PLT 224 217   BMET Recent Labs    06/05/21 0350 06/06/21 0719  NA 134* 130*  K 4.0 5.5*  CL 104 100  CO2 20* 18*  GLUCOSE 96 664*  BUN 71* 89*  CREATININE 4.60* 5.23*  CALCIUM 8.6* 8.2*   PT/INR No results for input(s): LABPROT, INR in the last 72 hours. CMP     Component Value Date/Time   NA 130 (L) 06/06/2021 0719   NA 135 05/24/2020 1119   K 5.5 (H) 06/06/2021 0719   CL 100 06/06/2021 0719   CO2 18 (L) 06/06/2021 0719   GLUCOSE 664 (HH) 06/06/2021 0719   BUN 89 (H) 06/06/2021 0719   BUN 25 05/24/2020 1119   CREATININE 5.23 (H) 06/06/2021 0719   CREATININE 2.90 (H) 08/11/2015 1359   CALCIUM 8.2 (L) 06/06/2021 0719   PROT 5.4 (L) 06/05/2021 0350   PROT 7.5 05/24/2020 1119   ALBUMIN 1.6 (L) 06/06/2021 0719   ALBUMIN 4.1 05/24/2020 1119   AST 20 06/05/2021 0350   ALT 16 06/05/2021 0350   ALKPHOS 263 (H) 06/05/2021 0350   BILITOT 1.5 (H) 06/05/2021 0350    BILITOT 2.4 (H) 05/24/2020 1119   GFRNONAA 12 (L) 06/06/2021 0719   GFRNONAA 23 (L) 08/11/2015 1359   GFRAA 24 (L) 05/24/2020 1119   GFRAA 27 (L) 08/11/2015 1359   Lipase     Component Value Date/Time   LIPASE 51 05/19/2021 1445       Studies/Results: No results found.  Anti-infectives: Anti-infectives (From admission, onward)    Start     Dose/Rate Route Frequency Ordered Stop   05/24/21 1730  piperacillin-tazobactam (ZOSYN) IVPB 2.25 g  Status:  Discontinued        2.25 g 100 mL/hr over 30 Minutes Intravenous Every 8 hours 05/24/21 1654 05/29/21 1303   05/24/21 1615  metroNIDAZOLE (FLAGYL) IVPB 500 mg       Note to Pharmacy: Please tube to #[phone]    500 mg 100 mL/hr over 60 Minutes Intravenous  Once 05/24/21 1603 05/24/21 1630        Assessment/Plan POD 14, s/p dx lap converted to ex lap for SBR and cholecystectomy for gangrenous cholecystitis and chronic ischemic segment of SB causing an obstruction, Dr. Donne Hazel 05/24/21 - [facility] - BID damp to dry dressing changes to midline, prn dressing changes over JP drain site. - soft diet  - mobilize/pulm toilet - zosyn stopped 12/12 after 5 days post op  completed, wbc normalized/ afebrile -Multimodal pain regimen, wean narcotics as able. Continues to have pain when dilaudid wears off. Not localized. Benign exam and afebrile/no WBC, had negative CTA POD 5. Would continue supportive care, mobilize. Repeat CT if WBC goes up or other clinical worsening.    FEN: soft diet VTE: SCDs, eliquis resumed 12/20, CBC pending ID: [email or web address], completed 12/12   UGI bleed - secondary to AVM in the gastric body, s/p tx by GI 12/15.   ESRD - TTS, per renal CHF - per cards A. Fib on eliquis, resumed 12/20 Acute blood loss Anemia - hgb stable 7 Acute hypoxic respiratory failure - extubated and doing well   LOS: 19 days    Clovis Riley, MD [facility] Surgery 06/07/2021, 7:15 AM Please see Amion for pager number during day  hours 7:00am-4:30pm

## 2021-06-07 NOTE — TOC Progression Note (Addendum)
Transition of Care [facility]) - Progression Note    Patient Details  Name: Patrick [name] MRN: [id number] Date of Birth: 08-19-1955  Transition of Care [facility]) CM/SW Contact  Hazem Kenner, LCSW Phone Number: 06/07/2021, 2:29 PM  Clinical Narrative:    HF CSW spoke with Mr. Gorelik at bedside and he states that he wants to go home and not to a SNF. Mr. Coone reported that his cousin Ladon MciVer, [phone] will stay with him and provide support. There is a concern for the dressing changes however Mr. Doscher reported that his cousin will provide support if needed due to his vision impairment. There are stairs to get into his home and he reported not needing any DME.  CSW will continue to follow throughout discharge.    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

## 2021-06-07 NOTE — NC FL2 (Signed)
[place] MEDICAID FL2 LEVEL OF CARE SCREENING TOOL     IDENTIFICATION  Patient Name: Patrick [name] Birthdate: 01/02/1956 Sex: male Admission Date (Current Location): 05/19/2021  [facility] and [place] Number:  Herbalist and Address:  [facility] [place], [place] [address] [place], [place] [address]      Provider Number: [id number]  Attending Physician Name and Address:  Florencia Reasons, MD  Relative Name and Phone Number:  Fransico Him [phone]    Current Level of Care: Hospital Recommended Level of Care: [place] Prior Approval Number:    Date Approved/Denied:   PASRR Number: [id number]  Discharge Plan: SNF    Current Diagnoses: Patient Active Problem List   Diagnosis Date Noted   Empyema of gallbladder 05/27/2021   Acute gangrenous cholecystitis s/p open cholcystectomy 05/24/2021 05/27/2021   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 05/24/2021 05/27/2021   ABLA (acute blood loss anemia)    Protein-calorie malnutrition, severe 05/23/2021   SBO (small bowel obstruction) ([place]) 05/19/2021   Acute upper GI bleed 08/02/2020   Prolonged QT interval 08/02/2020   Upper GI bleed 08/02/2020   Hematemesis with nausea    Chronic anticoagulation    Tobacco dependence 05/28/2020   Muscle spasm of back 60/73/7106   Chronic systolic CHF (congestive heart failure) ([place]) 09/11/2019   Hypoxia 06/02/2018   Symptomatic anemia    Chronic heart failure with preserved ejection fraction ([place])    Colon cancer screening 07/16/2017   Encounter for therapeutic drug monitoring 12/29/2015   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   Permanent atrial fibrillation ([place]) 12/24/2015   Cough with sputum 10/25/2015   Muscle tightness-Right arm  05/27/2014   Arm pain, right 05/06/2014   End stage renal disease ([place]) 05/06/2014   Chest pain 01/13/2014   Vitamin D deficiency 09/25/2013   CKD (chronic kidney disease), stage IV ([place]) 09/25/2013    Elevated uric acid in blood 09/25/2013   Hb-SS disease without crisis ([place]) 09/25/2013   Anemia 09/25/2013   Onychomycosis of toenail 08/27/2013   Cellulitis 08/27/2013   Hyperglycemia 08/27/2013   CHF (congestive heart failure) ([place]) 08/19/2013   Nausea & vomiting 02/07/2013   Sickle cell anemia ([place]) 11/21/2012   Abdominal pain 01/17/2012   NSVT (nonsustained ventricular tachycardia) 10/21/2011   Gout 10/19/2011   Sickle cell crisis ([place]) 10/17/2011   Acute on chronic systolic and diastolic heart failure, NYHA class 4 ([place]) 10/17/2011   Essential hypertension 06/07/2011    Orientation RESPIRATION BLADDER Height & Weight     Self, Time, Situation, Place  Normal Continent Weight: 153 lb (69.4 kg) Height:  5\' 11"  (180.3 cm)  BEHAVIORAL SYMPTOMS/MOOD NEUROLOGICAL BOWEL NUTRITION STATUS      Continent Diet  AMBULATORY STATUS COMMUNICATION OF NEEDS Skin   Limited Assist Verbally Other (Comment) (dry,flaky,itching,rash,buttocks,wound incision open or dehiced (MASD) sacrum, right)                       Personal Care Assistance Level of Assistance  Bathing, Feeding, Dressing Bathing Assistance: Limited assistance Feeding assistance: Limited assistance Dressing Assistance: Limited assistance     Functional Limitations Info  Sight, Hearing, Speech Sight Info: Impaired (Blind in Left and Right Eye) Hearing Info: Adequate Speech Info: Adequate    SPECIAL CARE FACTORS FREQUENCY  PT (By licensed PT), OT (By licensed OT)     PT Frequency: 5x min weekly OT Frequency: 5x min weekly  Contractures Contractures Info: Not present    Additional Factors Info  Code Status, Allergies Code Status Info: FULL Allergies Info: No Known Allergies   Insulin Sliding Scale Info: insulin aspart (novoLOG) injection 0-6 Units every 6 hours       Current Medications (06/07/2021):  This is the current hospital active medication list Current Facility-Administered Medications   Medication Dose Route Frequency Provider Last Rate Last Admin   acetaminophen (TYLENOL) tablet 1,000 mg  1,000 mg Oral Q6H Saverio Danker, PA-C   1,000 mg at 06/07/21 0823   albuterol (PROVENTIL) (2.5 MG/3ML) 0.083% nebulizer solution 3 mL  3 mL Inhalation Q6H PRN Milus Banister, MD       apixaban Arne Cleveland) tablet 5 mg  5 mg Oral BID Lyndee Leo, RPH   5 mg at 06/07/21 3790   arformoterol (BROVANA) nebulizer solution 15 mcg  15 mcg Nebulization BID Milus Banister, MD   15 mcg at 06/07/21 0752   budesonide (PULMICORT) nebulizer solution 0.25 mg  0.25 mg Nebulization BID Milus Banister, MD   0.25 mg at 06/07/21 2409   Chlorhexidine Gluconate Cloth 2 % PADS 6 each  6 each Topical Daily Milus Banister, MD   6 each at 06/06/21 0800   Darbepoetin Alfa (ARANESP) injection 200 mcg  200 mcg Intravenous Q Willow Ora, MD   200 mcg at 06/06/21 0815   feeding supplement (BOOST / RESOURCE BREEZE) liquid 1 Container  1 Container Oral TID BM Modena Jansky, MD   1 Container at 06/07/21 0825   hydrALAZINE (APRESOLINE) injection 10 mg  10 mg Intravenous Q8H PRN Milus Banister, MD       HYDROmorphone (DILAUDID) injection 0.5 mg  0.5 mg Intravenous Q4H PRN Clovis Riley, MD       MEDLINE mouth rinse  15 mL Mouth Rinse BID Milus Banister, MD   15 mL at 06/07/21 7353   methocarbamol (ROBAXIN) tablet 1,000 mg  1,000 mg Oral QID Barkley Boards R, PA-C   1,000 mg at 06/07/21 2992   oxyCODONE (Oxy IR/ROXICODONE) immediate release tablet 5-10 mg  5-10 mg Oral Q6H PRN Romana Juniper A, MD   10 mg at 06/07/21 0823   pantoprazole (PROTONIX) injection 40 mg  40 mg Intravenous Q12H Magdalen Spatz, NP   40 mg at 06/07/21 4268   polyvinyl alcohol (LIQUIFILM TEARS) 1.4 % ophthalmic solution 1 drop  1 drop Both Eyes PRN Hongalgi, Lenis Dickinson, MD       revefenacin (YUPELRI) nebulizer solution 175 mcg  175 mcg Nebulization Daily Milus Banister, MD   175 mcg at 06/07/21 0752   simethicone (MYLICON)  40 TM/1.9QQ suspension 80 mg  80 mg Oral Q6H PRN Saverio Danker, PA-C   80 mg at 05/30/21 1345   sodium chloride flush (NS) 0.9 % injection 10-40 mL  10-40 mL Intracatheter Q12H Milus Banister, MD   10 mL at 06/07/21 0826   sodium chloride flush (NS) 0.9 % injection 10-40 mL  10-40 mL Intracatheter PRN Milus Banister, MD       traMADol Veatrice Bourbon) tablet 50 mg  50 mg Oral Q12H PRN Jesusita Oka, MD   50 mg at 06/07/21 2297     Discharge Medications: Please see discharge summary for a list of discharge medications.  Relevant Imaging Results:  Relevant Lab Results:   Additional Information SSN#: [id number] [place] COVID-19 Vaccine 10/26/2019 , 10/01/2019  July Linam, LCSW

## 2021-06-08 LAB — CBC
HCT: 23.3 % — ABNORMAL LOW (ref 39.0–52.0)
Hemoglobin: 7.8 g/dL — ABNORMAL LOW (ref 13.0–17.0)
MCH: 30 pg (ref 26.0–34.0)
MCHC: 33.5 g/dL (ref 30.0–36.0)
MCV: 89.6 fL (ref 80.0–100.0)
Platelets: 265 10*3/uL (ref 150–400)
RBC: 2.6 MIL/uL — ABNORMAL LOW (ref 4.22–5.81)
RDW: 17.7 % — ABNORMAL HIGH (ref 11.5–15.5)
WBC: 9.3 10*3/uL (ref 4.0–10.5)
nRBC: 0.3 % — ABNORMAL HIGH (ref 0.0–0.2)

## 2021-06-08 LAB — LACTIC ACID, PLASMA: Lactic Acid, Venous: 0.6 mmol/L (ref 0.5–1.9)

## 2021-06-08 LAB — RENAL FUNCTION PANEL
Albumin: 1.7 g/dL — ABNORMAL LOW (ref 3.5–5.0)
Anion gap: 10 (ref 5–15)
BUN: 49 mg/dL — ABNORMAL HIGH (ref 8–23)
CO2: 22 mmol/L (ref 22–32)
Calcium: 8.2 mg/dL — ABNORMAL LOW (ref 8.9–10.3)
Chloride: 97 mmol/L — ABNORMAL LOW (ref 98–111)
Creatinine, Ser: 4.73 mg/dL — ABNORMAL HIGH (ref 0.61–1.24)
GFR, Estimated: 13 mL/min — ABNORMAL LOW (ref >60)
Glucose, Bld: 85 mg/dL (ref 70–99)
Phosphorus: 4.8 mg/dL — ABNORMAL HIGH (ref 2.5–4.6)
Potassium: 4.4 mmol/L (ref 3.5–5.1)
Sodium: 129 mmol/L — ABNORMAL LOW (ref 135–145)

## 2021-06-08 MED ORDER — METOPROLOL TARTRATE 5 MG/5ML IV SOLN: 2.5000 mg | Freq: Four times a day (QID) | INTRAVENOUS | Status: DC | PRN

## 2021-06-08 MED ORDER — CARVEDILOL 3.125 MG PO TABS
3.1250 mg | ORAL_TABLET | Freq: Two times a day (BID) | ORAL | Status: DC
  Administered 2021-06-08 – 2021-06-09 (×3): 3.125 mg via ORAL
  Filled 2021-06-08 (×3): qty 1

## 2021-06-08 MED ORDER — OXYCODONE HCL 5 MG PO TABS: 10.0000 mg | ORAL_TABLET | Freq: Four times a day (QID) | ORAL | 0 refills | Status: AC | PRN

## 2021-06-08 NOTE — Progress Notes (Addendum)
Progress Note  10 Days Post-Op  Subjective: Cc- abd pain. States his abd pain/soreness is slowly improving. Tolerating PO without nausea or emesis, and having bowel function. He tells me he may be discharged today and he is excited about this.  Objective: Vital signs in last 24 hours: Temp:  [97.8 F (36.6 C)-98.8 F (37.1 C)] 98.8 F (37.1 C) (12/22 0347) Pulse Rate:  [87-97] 96 (12/22 0347) Resp:  [11-20] 11 (12/21 1900) BP: (106-121)/(60-76) 106/74 (12/22 0347) SpO2:  [97 %-100 %] 97 % (12/22 0347) Weight:  [70.1 kg] 70.1 kg (12/22 0500) Last BM Date: 06/04/21  Intake/Output from previous day: No intake/output data recorded. Intake/Output this shift: No intake/output data recorded.  PE: General: NAD Abd: soft, appropriately tender. +BS. Nondistended. Midline wound as below with some necrotic tissue but no signs of infection --     Lab Results:  Recent Labs    06/07/21 0900 06/08/21 0435  WBC 9.3 9.3  HGB 7.6* 7.8*  HCT 22.8* 23.3*  PLT 247 265   BMET Recent Labs    06/07/21 1426 06/08/21 0435  NA 130* 129*  K 4.2 4.4  CL 97* 97*  CO2 23 22  GLUCOSE 104* 85  BUN 45* 49*  CREATININE 4.82* 4.73*  CALCIUM 8.3* 8.2*   PT/INR No results for input(s): LABPROT, INR in the last 72 hours. CMP     Component Value Date/Time   NA 129 (L) 06/08/2021 0435   NA 135 05/24/2020 1119   K 4.4 06/08/2021 0435   CL 97 (L) 06/08/2021 0435   CO2 22 06/08/2021 0435   GLUCOSE 85 06/08/2021 0435   BUN 49 (H) 06/08/2021 0435   BUN 25 05/24/2020 1119   CREATININE 4.73 (H) 06/08/2021 0435   CREATININE 2.90 (H) 08/11/2015 1359   CALCIUM 8.2 (L) 06/08/2021 0435   PROT 5.4 (L) 06/05/2021 0350   PROT 7.5 05/24/2020 1119   ALBUMIN 1.7 (L) 06/08/2021 0435   ALBUMIN 4.1 05/24/2020 1119   AST 20 06/05/2021 0350   ALT 16 06/05/2021 0350   ALKPHOS 263 (H) 06/05/2021 0350   BILITOT 1.5 (H) 06/05/2021 0350   BILITOT 2.4 (H) 05/24/2020 1119   GFRNONAA 13 (L) 06/08/2021 0435    GFRNONAA 23 (L) 08/11/2015 1359   GFRAA 24 (L) 05/24/2020 1119   GFRAA 27 (L) 08/11/2015 1359   Lipase     Component Value Date/Time   LIPASE 51 05/19/2021 1445       Studies/Results: DG Abd 1 View  Result Date: 06/07/2021 CLINICAL DATA:  Abdominal pain, small-bowel obstruction. EXAM: ABDOMEN - 1 VIEW COMPARISON:  CTA abdomen and pelvis 05/29/2021. FINDINGS: Dilated small bowel loops in the left abdomen measuring up 4 cm appear unchanged. There are surgical clips in the right upper quadrant. There are 3 small linear radiopaque densities in the left abdomen age measuring 9 mm which were not seen on the prior study. No fractures are identified. IMPRESSION: 1. Stable dilated small bowel in the left abdomen compatible with small-bowel obstruction. 2. Three new linear radiopaque densities in the left abdomen may represent external artifact or surgical clips. Please correlate clinically. Electronically Signed   By: Ronney Asters M.D.   On: 06/07/2021 20:54    Anti-infectives: Anti-infectives (From admission, onward)    Start     Dose/Rate Route Frequency Ordered Stop   05/24/21 1730  piperacillin-tazobactam (ZOSYN) IVPB 2.25 g  Status:  Discontinued        2.25 g 100 mL/hr over 30  Minutes Intravenous Every 8 hours 05/24/21 1654 05/29/21 1303   05/24/21 1615  metroNIDAZOLE (FLAGYL) IVPB 500 mg       Note to Pharmacy: Please tube to #75    500 mg 100 mL/hr over 60 Minutes Intravenous  Once 05/24/21 1603 05/24/21 1630        Assessment/Plan POD 15, s/p dx lap converted to ex lap for SBR and cholecystectomy for gangrenous cholecystitis and chronic ischemic segment of SB causing an obstruction, Dr. Donne Hazel 05/24/21 [facility] - BID damp to dry dressing changes to midline, prn dressing changes over JP drain site. --  - soft diet  - mobilize/pulm toilet - zosyn stopped 12/12 after 5 days post op completed, wbc normalized/ afebrile - Multimodal pain regimen, wean narcotics as  able. Benign exam and afebrile/no WBC, had negative CTA POD 5.  - stable for discharge from a general surgery perspective. Will plan to debride his wound prior to discharge. He will need to continue daily moist-to-dry dressing changes at home. PO pain medication provided. He was prescribed oxy 10 mg PO tabs #90 on 05/17/21 and gets this monthly. I have given him additional oxycodone (5 mg #40) for acute on chronic pain. I did not elect to give him tramadol due to his history of QTC prolongation. If ECG shows a normal QTC then it is not unreasonable to send him on tramadol as well for one week - defer this to primary team.    FEN: soft diet VTE: SCDs, eliquis resumed 12/20, CBC pending ID: [email or web address], completed 12/12   UGI bleed - secondary to AVM in the gastric body, s/p tx by GI 12/15.   ESRD - TTS, per renal CHF - per cards A. Fib on eliquis, resumed 12/20 Acute blood loss Anemia - hgb stable 7 Acute hypoxic respiratory failure - extubated and doing well   LOS: 20 days    Jill Alexanders, [facility] 06/08/2021, 7:20 AM Please see Amion for pager number during day hours 7:00am-4:30pm

## 2021-06-08 NOTE — Progress Notes (Signed)
PT Cancellation Note  Patient Details Name: Patrick [name] MRN: [id number] DOB: 1956/01/11   Cancelled Treatment:    Reason Eval/Treat Not Completed: Patient at procedure or test/unavailable (HD). Will follow-up for PT treatment as schedule permits. Noted pt declining SNF, planning to return home with cousin's assist.   Mabeline Caras, PT, DPT Acute Rehabilitation Services  Pager [phone] Office [place] 06/08/2021, 9:06 AM

## 2021-06-08 NOTE — Procedures (Signed)
I was present at this dialysis session. I have reviewed the session itself and made appropriate changes.  Feeling better and stable for discharge from renal standpoint.   Vital signs in last 24 hours:  Temp:  [97.8 F (36.6 C)-98.8 F (37.1 C)] 98.4 F (36.9 C) (12/22 0827) Pulse Rate:  [87-99] 97 (12/22 0833) Resp:  [11-18] 14 (12/22 0833) BP: (106-127)/(60-79) 123/75 (12/22 0833) SpO2:  [95 %-100 %] 96 % (12/22 0827) Weight:  [70.1 kg-71.2 kg] 71.2 kg (12/22 0827) Weight change: -1.019 kg Filed Weights   06/07/21 0642 06/08/21 0500 06/08/21 0827  Weight: 69.4 kg 70.1 kg 71.2 kg    Recent Labs  Lab 06/08/21 0435  NA 129*  K 4.4  CL 97*  CO2 22  GLUCOSE 85  BUN 49*  CREATININE 4.73*  CALCIUM 8.2*  PHOS 4.8*    Recent Labs  Lab 06/06/21 0720 06/07/21 0900 06/08/21 0435  WBC 9.9 9.3 9.3  HGB 7.0* 7.6* 7.8*  HCT 20.5* 22.8* 23.3*  MCV 94.5 89.8 89.6  PLT 217 247 265    Scheduled Meds:  acetaminophen  1,000 mg Oral Q6H   apixaban  5 mg Oral BID   arformoterol  15 mcg Nebulization BID   budesonide (PULMICORT) nebulizer solution  0.25 mg Nebulization BID   Chlorhexidine Gluconate Cloth  6 each Topical Daily   darbepoetin (ARANESP) injection - DIALYSIS  200 mcg Intravenous Q Tue-HD   feeding supplement  1 Container Oral TID BM   mouth rinse  15 mL Mouth Rinse BID   methocarbamol  1,000 mg Oral QID   pantoprazole  40 mg Intravenous Q12H   revefenacin  175 mcg Nebulization Daily   sodium chloride flush  10-40 mL Intracatheter Q12H   Continuous Infusions: PRN Meds:.albuterol, hydrALAZINE, HYDROmorphone (DILAUDID) injection, oxyCODONE, polyvinyl alcohol, simethicone, sodium chloride flush, traMADol   Patrick Potts,  MD 06/08/2021, 8:47 AM

## 2021-06-08 NOTE — Progress Notes (Signed)
Writer went over wound care instructions with patient and his niece. He will be staying with his cousin at discharge, who is this nieces father, so she will also be somewhat involved in patient's care.  Demonstrated full dressing change, instructing on materials being used, and tips for making dressing as comfortable for patient as can be.  All questions were answered, and niece and patient appeared to have good understanding of this process.

## 2021-06-08 NOTE — Plan of Care (Signed)

## 2021-06-08 NOTE — Progress Notes (Signed)
OT Cancellation Note  Patient Details Name: Patrick [name] MRN: [id number] DOB: 1956-06-04   Cancelled Treatment:    Reason Eval/Treat Not Completed: Patient at procedure or test/ unavailable Off unit for HD  Layla Maw 06/08/2021, 9:39 AM

## 2021-06-08 NOTE — Progress Notes (Incomplete)
Occupational Therapy Treatment Patient Details Name: Patrick [name] MRN: [id number] DOB: 1955-10-19 Today's Date: 06/08/2021   History of present illness Pt is a 65 y.o. male admitted 05/19/21 with SBO. S/p small bowel resection, open cholecystectomy without closure on 12/7. ETT 12/7-12/8. EGD on 12/12 showed significant amount of blood in stomach, lavaged and AVM bleeding; treated endoscopically. Course complicated by prolonged post-op ileus. PMH includes ESRD (on HD), anemia of CKD, CHF, afib. Of note, recent admission 11/22-11/26/22 at [facility] with similar issue s/p ERCP.   OT comments  ***   Recommendations for follow up therapy are one component of a multi-disciplinary discharge planning process, led by the attending physician.  Recommendations may be updated based on patient status, additional functional criteria and insurance authorization.    Follow Up Recommendations  No OT follow up    Assistance Recommended at Discharge Intermittent Supervision/Assistance  Equipment Recommendations  None recommended by OT    Recommendations for Other Services      Precautions / Restrictions Precautions Precautions: Fall;Other (comment) Precaution Comments: low vision; abdominal precautions for comfort Restrictions Weight Bearing Restrictions: No       Mobility Bed Mobility Overal bed mobility: Modified Independent Bed Mobility: Sit to Supine       Sit to supine: Modified independent (Device/Increase time)   General bed mobility comments: Received sitting EOB    Transfers Overall transfer level: Independent Equipment used: None Transfers: Sit to/from [employer] transfer comment: received standing in room     Balance Overall balance assessment: Needs assistance Sitting-balance support: No upper extremity supported;Feet supported Sitting balance-Leahy Scale: Good     Standing balance support: No upper extremity supported;During functional  activity Standing balance-Leahy Scale: Good               High level balance activites: Side stepping;Backward walking;Direction changes;Turns;Sudden stops;Head turns High Level Balance Comments: no overt instability or LOB noted with higher level balance tasks           ADL either performed or assessed with clinical judgement   ADL Overall ADL's : Modified independent                     Lower Body Dressing: Modified independent;Sit to/from stand Lower Body Dressing Details (indicate cue type and reason): able to cross LEs to don socks   Toilet Transfer Details (indicate cue type and reason): had just completed mobility to bathroom with PT without assist   Toileting - Clothing Manipulation Details (indicate cue type and reason): had completed toileting task Mod I with PT       General ADL Comments: Educated on straetgies for LB ADLs, clothing easier to manage at home, body mechanics for abdominal comfort in accessing items in kitchen, items from floor, etc. Encouraged pt to have family assist with meds initially if meds changeddue to visual deficits. Educated to avoid heavy lifting with family planning to assist with groceries, etc. Family present during session    Extremity/Trunk Assessment Upper Extremity Assessment Upper Extremity Assessment: Overall WFL for tasks assessed   Lower Extremity Assessment Lower Extremity Assessment: Defer to PT evaluation        Vision   Vision Assessment?: No apparent visual deficits   Perception     Praxis      Cognition Arousal/Alertness: Awake/alert Behavior During Therapy: WFL for tasks assessed/performed Overall Cognitive Status: Within Functional Limits for tasks assessed  General Comments: WFL for simple tasks, not formally assessed          Exercises     Shoulder Instructions       General Comments Pt's cousin present and supportive, aware pt planning to  d/c home. Reviewed educ re: activity recommendations, importance of frequent mobility    Pertinent Vitals/ Pain       Pain Assessment: 0-10 Pain Score: 7  Pain Location: abdomen Pain Descriptors / Indicators: Sore Pain Intervention(s): Monitored during session;Premedicated before session;Limited activity within patient's tolerance  Home Living                                          Prior Functioning/Environment              Frequency  Min 2X/week        Progress Toward Goals  OT Goals(current goals can now be found in the care plan section)  Progress towards OT goals: Progressing toward goals  Acute Rehab OT Goals Patient Stated Goal: go home soon OT Goal Formulation: With patient Time For Goal Achievement: 06/19/21 Potential to Achieve Goals: Good ADL Goals Pt Will Perform Grooming: with set-up;standing Pt Will Perform Lower Body Dressing: with modified independence;sit to/from stand;with adaptive equipment Pt Will Transfer to Toilet: with set-up;ambulating Pt Will Perform Toileting - Clothing Manipulation and hygiene: with modified independence;sit to/from stand;sitting/lateral leans Additional ADL Goal #1: Pt to increase activity tolerance > 10 min during functional tasks with stable vital signs  Plan Discharge plan needs to be updated    Co-evaluation                 AM-PAC OT "6 Clicks" Daily Activity     Outcome Measure   Help from another person eating meals?: None Help from another person taking care of personal grooming?: None Help from another person toileting, which includes using toliet, bedpan, or urinal?: None Help from another person bathing (including washing, rinsing, drying)?: A Little Help from another person to put on and taking off regular upper body clothing?: None Help from another person to put on and taking off regular lower body clothing?: None 6 Click Score: 23    End of Session    OT Visit Diagnosis:  Other abnormalities of gait and mobility (R26.89);Pain   Activity Tolerance Patient tolerated treatment well   Patient Left in bed;with call bell/phone within reach;with family/visitor present   Nurse Communication Mobility status        Time: 7711-6579 OT Time Calculation (min): 8 min  Charges: OT General Charges $OT Visit: 1 Visit OT Treatments $Self Care/Home Management : 8-22 mins  {enter signature dotphrase here}  Layla Maw 06/08/2021, 2:37 PM

## 2021-06-08 NOTE — Progress Notes (Signed)
PROGRESS NOTE    Patrick [name]  ZES:[id number] DOB: [birth year] DOA: 05/19/2021 PCP: Dorena Dew, FNP    Chief Complaint  Patient presents with   Small Bowel Obstruction    Brief Narrative:   H/o sickle cell anemia,,  A. fib on Eliquis, combined CHF, ESRD on HD TTS who was recently hospitalized in [place] for Choledocholithiasis w recent ERCP/Sphinct/CBD and PD stenting/stone extractio, he declined cholecystectomy at that time., he presented to the [facility] ED on 12/2 due to abdominal pain, found to have SBO and gangrenous cholecystitis S/p lap, cholecystectomy and SB resection 12/07, post op transferred to icu on vent, Extubated 12/8 , transferred to [facility] on 12/9   12/11, noted persistent bloody drainage from NGT, general surgery and [facility] on board, s/p DDAVP and Protonix infusion.  transferred to icu due to ongoing bleeding 12/12: EGD showed significant amount of blood in the stomach, lavaged and AVM bleeding, treated endoscopically.  Care transferred back from [facility] to [facility] on 12/14.    Course complicated by prolonged postop ileus, finally resolving  Subjective:  He is seen coming back from HD, C/o ab pain,  No n/v, reports had bm today, + flatus   Assessment & Plan:   Principal Problem:   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 05/24/2021 Active Problems:   Abdominal pain   Nausea & vomiting   Anemia   End stage renal disease (HCC)   Permanent atrial fibrillation (HCC)   Chronic systolic CHF (congestive heart failure) (HCC)   Upper GI bleed   SBO (small bowel obstruction) (HCC)   Protein-calorie malnutrition, severe   ABLA (acute blood loss anemia)   Empyema of gallbladder   Acute gangrenous cholecystitis s/p open cholcystectomy 05/24/2021   SBO/gangrenous cholecystitis/post op ileus  -S/p Diagnostic laparoscopy,  Small bowel resection with primary anastomosis,   Open cholecystectomy on 12/7 -was on ng suction and tpn -he has improved, now  tolerating diet, having bm and + flatus -Plan per general surgery, family come in to learn wound care   Acute upper GI bleed -Noted to have significant amount of blood coming out from NG tube on 12/11 -Transfer to ICU, had a EGD on 12/12showed significant amount of blood in the stomach, lavaged and AVM bleeding in the stomach.  Unclear if due to trauma from NG causing this.  Treated endoscopically.  IV PPI fusion transition to PPI twice daily.  Eliquis on hold and will be resumed 12/20 -Monitor hemoglobin, reports stool is returning to normal color  Afib Remain in afib, rate controlled  Was On and off amiodarone drip for rate control, currently not on any rate control agent Anticoagulation held due to anemia and recent surgery, resumed on 12/20 Cardiology was following, signed off on 12/19 Consider resume low dose coreg if bp able to tolerate, will discuss with cardiology prior to discharge   Chronic combined chf/Pulmonary HTN -Echo this admit suggestive of restrictive CM with EF 40-45%, RV mildly decreased, severe MR, severe TR, RVSP 55 mmHg -Volume managed by dialysis Cardiology/heart failure team was following, signed off on 12/19   HTN: bp stable off home bp meds, consider resume Coreg if BP able to tolerate   ESRD Electrolytes abnormalities  HD per Nephrology    Acute blood loss anemia / history of sickle cell anemia and anemia of chronic disease Prbcx10units so far FFP x2 on 12/7 Monitor cbc     Sickle cell anemia Home medication Hydrea on hold due to n.p.o.  status, will discuss with patient regarding resuming meds   Legally blind, reports lives by himself   COPD stable on Brovana ,Pulmicort ,yupelri, quit smoking in 2017 No wheezing on exam, no cough     Nutritional Assessment:  The patients BMI is: Body mass index is 21.22 kg/m.Marland Kitchen  Seen by dietician.  I agree with the assessment and plan as outlined below:  Nutrition Status: Nutrition Problem: Severe  Malnutrition Etiology: acute illness (SBO) Signs/Symptoms: moderate fat depletion, moderate muscle depletion, percent weight loss (8% weight loss within 3 months) Percent weight loss: 8 % Interventions: was on TPN,now tolerating po  .       DVT prophylaxis: SCDs Start: 05/19/21 2116 apixaban (ELIQUIS) tablet 5 mg   Code Status:full Family Communication: Patient Disposition:   Status is: Inpatient  Dispo: The patient is from: Home              Anticipated d/c is to: Initially agreed to skilled nursing facility, now state want to go home              Anticipated d/c date is: tomorrow                Consultants:  Cardiology Critical care GI General surgery Nephrology  Procedures:  See above  Antimicrobials:   Anti-infectives (From admission, onward)    Start     Dose/Rate Route Frequency Ordered Stop   05/24/21 1730  piperacillin-tazobactam (ZOSYN) IVPB 2.25 g  Status:  Discontinued        2.25 g 100 mL/hr over 30 Minutes Intravenous Every 8 hours 05/24/21 1654 05/29/21 1303   05/24/21 1615  metroNIDAZOLE (FLAGYL) IVPB 500 mg       Note to Pharmacy: Please tube to #75    500 mg 100 mL/hr over 60 Minutes Intravenous  Once 05/24/21 1603 05/24/21 1630           Objective: Vitals:   06/08/21 1030 06/08/21 1100 06/08/21 1130 06/08/21 1205  BP: 118/65 124/69 125/73   Pulse: 88 88 86 99  Resp: 16 12 16 17   Temp:    (!) 97.5 F (36.4 C)  TempSrc:    Temporal  SpO2:    95%  Weight:    69 kg  Height:        Intake/Output Summary (Last 24 hours) at 06/08/2021 1313 Last data filed at 06/08/2021 1205 Gross per 24 hour  Intake 0 ml  Output 2000 ml  Net -2000 ml   Filed Weights   06/08/21 0500 06/08/21 0827 06/08/21 1205  Weight: 70.1 kg 71.2 kg 69 kg    Examination:  General exam: alert, awake, communicative,calm, NAD, legally blind Respiratory system: Clear to auscultation. Respiratory effort normal. Cardiovascular system:  RRR.  Gastrointestinal  system: Abdomen post op changes, dressing intact,  bowel sounds heard, not very active though Central nervous system: Alert and oriented. No focal neurological deficits. Extremities:  no edema Skin: No rashes, lesions or ulcers Psychiatry: Judgement and insight appear normal. Mood & affect appropriate.     Data Reviewed: I have personally reviewed following labs and imaging studies  CBC: Recent Labs  Lab 06/04/21 0619 06/05/21 0350 06/06/21 0720 06/07/21 0900 06/08/21 0435  WBC 12.2* 12.4* 9.9 9.3 9.3  HGB 7.4* 7.1* 7.0* 7.6* 7.8*  HCT 21.7* 21.8* 20.5* 22.8* 23.3*  MCV 91.6 91.2 94.5 89.8 89.6  PLT 209 224 217 247 784    Basic Metabolic Panel: Recent Labs  Lab 06/03/21 0500 06/04/21 0619 06/05/21 0350  06/06/21 0719 06/07/21 1426 06/08/21 0435  NA 137 137 134* 130* 130* 129*  K 2.9* 3.9 4.0 5.5* 4.2 4.4  CL 111 104 104 100 97* 97*  CO2 16* 24 20* 18* 23 22  GLUCOSE 90 105* 96 664* 104* 85  BUN 73* 50* 71* 89* 45* 49*  CREATININE 4.54* 3.71* 4.60* 5.23* 4.82* 4.73*  CALCIUM 7.2* 8.4* 8.6* 8.2* 8.3* 8.2*  MG 1.7 2.2 2.2  --  1.8  --   PHOS 3.6 3.5 4.4 6.5*  --  4.8*    GFR: Estimated Creatinine Clearance: 15.6 mL/min (A) (by C-G formula based on SCr of 4.73 mg/dL (H)).  Liver Function Tests: Recent Labs  Lab 06/05/21 0350 06/06/21 0719 06/08/21 0435  AST 20  --   --   ALT 16  --   --   ALKPHOS 263*  --   --   BILITOT 1.5*  --   --   PROT 5.4*  --   --   ALBUMIN 1.7* 1.6* 1.7*    CBG: Recent Labs  Lab 06/04/21 2018 06/04/21 2328 06/05/21 0348 06/05/21 0728 06/06/21 0914  GLUCAP 119* 118* 93 105* 94     No results found for this or any previous visit (from the past 240 hour(s)).       Radiology Studies: DG Abd 1 View  Result Date: 06/07/2021 CLINICAL DATA:  Abdominal pain, small-bowel obstruction. EXAM: ABDOMEN - 1 VIEW COMPARISON:  CTA abdomen and pelvis 05/29/2021. FINDINGS: Dilated small bowel loops in the left abdomen measuring up 4  cm appear unchanged. There are surgical clips in the right upper quadrant. There are 3 small linear radiopaque densities in the left abdomen age measuring 9 mm which were not seen on the prior study. No fractures are identified. IMPRESSION: 1. Stable dilated small bowel in the left abdomen compatible with small-bowel obstruction. 2. Three new linear radiopaque densities in the left abdomen may represent external artifact or surgical clips. Please correlate clinically. Electronically Signed   By: Ronney Asters M.D.   On: 06/07/2021 20:54        Scheduled Meds:  acetaminophen  1,000 mg Oral Q6H   apixaban  5 mg Oral BID   arformoterol  15 mcg Nebulization BID   budesonide (PULMICORT) nebulizer solution  0.25 mg Nebulization BID   Chlorhexidine Gluconate Cloth  6 each Topical Daily   darbepoetin (ARANESP) injection - DIALYSIS  200 mcg Intravenous Q Tue-HD   feeding supplement  1 Container Oral TID BM   mouth rinse  15 mL Mouth Rinse BID   methocarbamol  1,000 mg Oral QID   pantoprazole  40 mg Intravenous Q12H   revefenacin  175 mcg Nebulization Daily   sodium chloride flush  10-40 mL Intracatheter Q12H   Continuous Infusions:   LOS: 20 days   Time spent: 29mins Greater than 50% of this time was spent in counseling, explanation of diagnosis, planning of further management, and coordination of care.   Voice Recognition Viv[employer] Sp[employer] dictation system was used to create this note, attempts have been made to correct errors. Please contact the author with questions and/or clarifications.   Florencia Reasons, MD PhD FACP [facility]  Available via Epic secure chat 7am-7pm for nonurgent issues Please page for urgent issues To page the attending provider between 7A-7P or the covering provider during after hours 7P-7A, please log into the web site [email or web address] and access using universal [place] password for that web site. If you do not have the  password, please call the hospital  operator.    06/08/2021, 1:13 PM

## 2021-06-08 NOTE — Progress Notes (Signed)
Physical Therapy Treatment Patient Details Name: Patrick [name] MRN: [id number] DOB: 1955-07-30 Today's Date: 06/08/2021   History of Present Illness Pt is a 65 y.o. male admitted 05/19/21 with SBO. S/p small bowel resection, open cholecystectomy without closure on 12/7. ETT 12/7-12/8. EGD on 12/12 showed significant amount of blood in stomach, lavaged and AVM bleeding; treated endoscopically. Course complicated by prolonged post-op ileus. PMH includes ESRD (on HD), anemia of CKD, CHF, afib. Of note, recent admission 11/22-11/26/22 at [facility] with similar issue s/p ERCP.   PT Comments    Pt progressing with mobility; able to ambulate and perform ADL tasks with supervision for safety due to low vision and fall risk. Pt plans to d/c home with intermittent assist available from family. Reviewed educ re: activity recommendations, fall risk reduction, importance of mobility. Will continue to follow acutely to address established goals.    Recommendations for follow up therapy are one component of a multi-disciplinary discharge planning process, led by the attending physician.  Recommendations may be updated based on patient status, additional functional criteria and insurance authorization.  Follow Up Recommendations  Home health PT     Assistance Recommended at Discharge Intermittent Supervision/Assistance  Equipment Recommendations  None recommended by PT    Recommendations for Other Services       Precautions / Restrictions Precautions Precautions: Fall;Other (comment) Precaution Comments: low vision; abdominal precautions for comfort Restrictions Weight Bearing Restrictions: No     Mobility  Bed Mobility               General bed mobility comments: Received sitting EOB    Transfers Overall transfer level: Independent Equipment used: None Transfers: Sit to/from Stand                  Ambulation/Gait Ambulation/Gait assistance: Supervision Gait  Distance (Feet): 182 Feet Assistive device: None Gait Pattern/deviations: Step-through pattern;Decreased stride length Gait velocity: Decreased     General Gait Details: Slow, steady gait without DME; supervision for safety due to low vision. No overt instability or LOB noted. Pt declined further ambulation distance despite encouragement, no reason specified   Stairs Stairs:  (pt declined stair training prior to d/c; encouraged use of rail at to ascend flight of stairs to 2nd floor apt)           Wheelchair Mobility    Modified Rankin (Stroke Patients Only)       Balance Overall balance assessment: Needs assistance Sitting-balance support: No upper extremity supported;Feet supported Sitting balance-Leahy Scale: Good     Standing balance support: No upper extremity supported;During functional activity Standing balance-Leahy Scale: Good               High level balance activites: Side stepping;Backward walking;Direction changes;Turns;Sudden stops;Head turns High Level Balance Comments: no overt instability or LOB noted with higher level balance tasks            Cognition Arousal/Alertness: Awake/alert Behavior During Therapy: WFL for tasks assessed/performed Overall Cognitive Status: Within Functional Limits for tasks assessed                                 General Comments: WFL for simple tasks, not formally assessed        Exercises      General Comments General comments (skin integrity, edema, etc.): Pt's cousin present and supportive, aware pt planning to d/c home. Reviewed educ re: activity recommendations, importance of frequent mobility  Pertinent Vitals/Pain Pain Assessment: No/denies pain Pain Intervention(s): Monitored during session    Home Living                          Prior Function            PT Goals (current goals can now be found in the care plan section) Progress towards PT goals: Progressing  toward goals    Frequency    Min 3X/week      PT Plan Discharge plan needs to be updated    Co-evaluation              AM-PAC PT "6 Clicks" Mobility   Outcome Measure  Help needed turning from your back to your side while in a flat bed without using bedrails?: None Help needed moving from lying on your back to sitting on the side of a flat bed without using bedrails?: None Help needed moving to and from a bed to a chair (including a wheelchair)?: None Help needed standing up from a chair using your arms (e.g., wheelchair or bedside chair)?: None Help needed to walk in hospital room?: None Help needed climbing 3-5 steps with a railing? : A Little 6 Click Score: 23    End of Session   Activity Tolerance: Patient tolerated treatment well Patient left: Other (comment);with family/visitor present (standing with OT) Nurse Communication: Mobility status PT Visit Diagnosis: Muscle weakness (generalized) (M62.81);Difficulty in walking, not elsewhere classified (R26.2);Other abnormalities of gait and mobility (R26.89)     Time: 2355-7322 PT Time Calculation (min) (ACUTE ONLY): 10 min  Charges:  $Therapeutic Exercise: 8-22 mins                     Mabeline Caras, PT, DPT Acute Rehabilitation Services  Pager [phone] Office [place] 06/08/2021, 2:29 PM

## 2021-06-08 NOTE — Discharge Instructions (Signed)
MIDLINE WOUND CARE: - midline dressing to be changed twice daily - supplies: sterile saline, kerlix, scissors, ABD pads, tape  - remove dressing and all packing carefully, moistening with sterile saline as needed to avoid packing/internal dressing sticking to the wound. - clean edges of skin around the wound with water/gauze, making sure there is no tape debris or leakage left on skin that could cause skin irritation or breakdown. - dampen and clean kerlix with sterile saline and pack wound from wound base to skin level, making sure to take note of any possible areas of wound tracking, tunneling and packing appropriately. Wound can be packed loosely. Trim kerlix to size if a whole kerlix is not required. - cover wound with a dry ABD pad and secure with tape.  - write the date/time on the dry dressing/tape to better track when the last dressing change occurred. - apply any skin protectant/powder recommended by clinician to protect skin/skin folds. - change dressing as needed if leakage occurs, wound gets contaminated, or you would like to shower. - you may shower daily with wound open and following the shower the wound should be dried and a clean dressing placed.    [facility], [place] [phone]  OPEN ABDOMINAL SURGERY: POST OP INSTRUCTIONS  Always review your discharge instruction sheet given to you by the facility where your surgery was performed.  IF YOU HAVE DISABILITY OR FAMILY LEAVE FORMS, YOU MUST BRING THEM TO THE OFFICE FOR PROCESSING.  PLEASE DO NOT GIVE THEM TO YOUR DOCTOR.  A prescription for pain medication may be given to you upon discharge.  Take your pain medication as prescribed, if needed.  If narcotic pain medicine is not needed, then you may take acetaminophen (Tylenol) or ibuprofen (Advil) as needed. Take your usually prescribed medications unless otherwise directed. If you need a refill on your pain medication, please contact your pharmacy. They will  contact our office to request authorization.  Prescriptions will not be filled after 5pm or on week-ends. You should follow a light diet the first few days after arrival home, such as soup and crackers, pudding, etc.unless your doctor has advised otherwise. A high-fiber, low fat diet can be resumed as tolerated.   Be sure to include lots of fluids daily. Most patients will experience some swelling and bruising on the chest and neck area.  Ice packs will help.  Swelling and bruising can take several days to resolve Most patients will experience some swelling and bruising in the area of the incision. Ice pack will help. Swelling and bruising can take several days to resolve..  It is common to experience some constipation if taking pain medication after surgery.  Increasing fluid intake and taking a stool softener will usually help or prevent this problem from occurring.  A mild laxative (Milk of Magnesia or Miralax) should be taken according to package directions if there are no bowel movements after 48 hours.  You may have steri-strips (small skin tapes) in place directly over the incision.  These strips should be left on the skin for 7-10 days.  If your surgeon used skin glue on the incision, you may shower in 24 hours.  The glue will flake off over the next 2-3 weeks.  Any sutures or staples will be removed at the office during your follow-up visit. You may find that a light gauze bandage over your incision may keep your staples from being rubbed or pulled. You may shower and replace  the bandage daily. ACTIVITIES:  You may resume regular (light) daily activities beginning the next day--such as daily self-care, walking, climbing stairs--gradually increasing activities as tolerated.  You may have sexual intercourse when it is comfortable.  Refrain from any heavy lifting or straining until approved by your doctor. You may drive when you no longer are taking prescription pain medication, you can comfortably wear  a seatbelt, and you can safely maneuver your car and apply brakes Return to Work: ___________________________________ Patrick [name] should see your doctor in the office for a follow-up appointment approximately two weeks after your surgery.  Make sure that you call for this appointment within a day or two after you arrive home to insure a convenient appointment time. OTHER INSTRUCTIONS:  _____________________________________________________________ _____________________________________________________________  WHEN TO CALL YOUR DOCTOR: Fever over 101.0 Inability to urinate Nausea and/or vomiting Extreme swelling or bruising Continued bleeding from incision. Increased pain, redness, or drainage from the incision. Difficulty swallowing or breathing Muscle cramping or spasms. Numbness or tingling in hands or feet or around lips.  The clinic staff is available to answer your questions during regular business hours.  Please dont hesitate to call and ask to speak to one of the nurses if you have concerns.  For further questions, please visit [email or web address]

## 2021-06-08 NOTE — Progress Notes (Signed)
Occupational Therapy Treatment Patient Details Name: Patrick [name] MRN: [id number] DOB: Jul 18, 1955 Today's Date: 06/08/2021   History of present illness Pt is a 65 y.o. male admitted 05/19/21 with SBO. S/p small bowel resection, open cholecystectomy without closure on 12/7. ETT 12/7-12/8. EGD on 12/12 showed significant amount of blood in stomach, lavaged and AVM bleeding; treated endoscopically. Course complicated by prolonged post-op ileus. PMH includes ESRD (on HD), anemia of CKD, CHF, afib. Of note, recent admission 11/22-11/26/22 at [facility] with similar issue s/p ERCP.   OT comments  Pt received after hallway mobility with PT. Reinforced abdominal precautions and body mechanics to minimize pain with LB ADLs with pt reporting able to don socks without assist today, as well as mobility to/from bathroom prior to OT entry. Family at bedside - encouraged assistance with meds initially due to low vision and likely change of medications at DC, groceries to avoid heavy lifting and encouraged continued mobility during admission. Anticipate no OT needs at DC and pt declines need for DME.    Recommendations for follow up therapy are one component of a multi-disciplinary discharge planning process, led by the attending physician.  Recommendations may be updated based on patient status, additional functional criteria and insurance authorization.    Follow Up Recommendations  No OT follow up    Assistance Recommended at Discharge Intermittent Supervision/Assistance  Equipment Recommendations  None recommended by OT    Recommendations for Other Services      Precautions / Restrictions Precautions Precautions: Fall;Other (comment) Precaution Comments: low vision; abdominal precautions for comfort Restrictions Weight Bearing Restrictions: No       Mobility Bed Mobility Overal bed mobility: Modified Independent Bed Mobility: Sit to Supine       Sit to supine: Modified independent  (Device/Increase time)   General bed mobility comments: Received sitting EOB    Transfers Overall transfer level: Independent Equipment used: None Transfers: Sit to/from [employer] transfer comment: received standing in room     Balance Overall balance assessment: Needs assistance Sitting-balance support: No upper extremity supported;Feet supported Sitting balance-Leahy Scale: Good     Standing balance support: No upper extremity supported;During functional activity Standing balance-Leahy Scale: Good               High level balance activites: Side stepping;Backward walking;Direction changes;Turns;Sudden stops;Head turns High Level Balance Comments: no overt instability or LOB noted with higher level balance tasks           ADL either performed or assessed with clinical judgement   ADL Overall ADL's : Modified independent                     Lower Body Dressing: Modified independent;Sit to/from stand Lower Body Dressing Details (indicate cue type and reason): able to cross LEs to don socks   Toilet Transfer Details (indicate cue type and reason): had just completed mobility to bathroom with PT without assist   Toileting - Clothing Manipulation Details (indicate cue type and reason): had completed toileting task Mod I with PT       General ADL Comments: Educated on straetgies for LB ADLs, clothing easier to manage at home, body mechanics for abdominal comfort in accessing items in kitchen, items from floor, etc. Encouraged pt to have family assist with meds initially if meds changeddue to visual deficits. Educated to avoid heavy lifting with family planning to assist with groceries, etc. Family present  during session    Extremity/Trunk Assessment Upper Extremity Assessment Upper Extremity Assessment: Overall WFL for tasks assessed   Lower Extremity Assessment Lower Extremity Assessment: Defer to PT evaluation        Vision    Vision Assessment?: No apparent visual deficits   Perception     Praxis      Cognition Arousal/Alertness: Awake/alert Behavior During Therapy: WFL for tasks assessed/performed Overall Cognitive Status: Within Functional Limits for tasks assessed                                 General Comments: WFL for simple tasks, not formally assessed          Exercises     Shoulder Instructions       General Comments Pt's cousin present and supportive, aware pt planning to d/c home. Reviewed educ re: activity recommendations, importance of frequent mobility    Pertinent Vitals/ Pain       Pain Assessment: 0-10 Pain Score: 7  Pain Location: abdomen Pain Descriptors / Indicators: Sore Pain Intervention(s): Monitored during session;Premedicated before session;Limited activity within patient's tolerance  Home Living                                          Prior Functioning/Environment              Frequency  Min 2X/week        Progress Toward Goals  OT Goals(current goals can now be found in the care plan section)  Progress towards OT goals: Progressing toward goals  Acute Rehab OT Goals Patient Stated Goal: go home soon OT Goal Formulation: With patient Time For Goal Achievement: 06/19/21 Potential to Achieve Goals: Good ADL Goals Pt Will Perform Grooming: with set-up;standing Pt Will Perform Lower Body Dressing: with modified independence;sit to/from stand;with adaptive equipment Pt Will Transfer to Toilet: with set-up;ambulating Pt Will Perform Toileting - Clothing Manipulation and hygiene: with modified independence;sit to/from stand;sitting/lateral leans Additional ADL Goal #1: Pt to increase activity tolerance > 10 min during functional tasks with stable vital signs  Plan Discharge plan needs to be updated    Co-evaluation                 AM-PAC OT "6 Clicks" Daily Activity     Outcome Measure   Help from another  person eating meals?: None Help from another person taking care of personal grooming?: None Help from another person toileting, which includes using toliet, bedpan, or urinal?: None Help from another person bathing (including washing, rinsing, drying)?: A Little Help from another person to put on and taking off regular upper body clothing?: None Help from another person to put on and taking off regular lower body clothing?: None 6 Click Score: 23    End of Session    OT Visit Diagnosis: Other abnormalities of gait and mobility (R26.89);Pain   Activity Tolerance Patient tolerated treatment well   Patient Left in bed;with call bell/phone within reach;with family/visitor present   Nurse Communication Mobility status        Time: 9323-5573 OT Time Calculation (min): 8 min  Charges: OT General Charges $OT Visit: 1 Visit OT Treatments $Self Care/Home Management : 8-22 mins  Malachy Chamber, OTR/L Acute Rehab Services Office: [phone]   Layla Maw 06/08/2021, 2:38 PM

## 2021-06-08 NOTE — Progress Notes (Addendum)
Mobility Specialist Progress Note   06/08/21 1655  Mobility  Activity Ambulated in hall  Level of Assistance Independent after set-up  Assistive Device None  Distance Ambulated (ft) 180 ft  Mobility Ambulated independently in hallway  Mobility Response Tolerated well  Mobility performed by Mobility specialist  $Mobility charge 1 Mobility   Pt on EOB upon arrival having no complaints and agreeable. Pt progressing w/ mobility but having slight pain in abdominals when finished. Left back on EOB w/ call bell in reach and RN notified.   Pre Mobility: 112 HR During Mobility: 143 HR Post Mobility: 105 HR  Tacey Heap Phone Number [phone]    Holland Falling Mobility Specialist Phone Number [phone]

## 2021-06-08 NOTE — Progress Notes (Signed)
At [place] entered room to administer CHG wipes to skin. Patient refused at this time and asked that we wait until he is up by the sink getting washed up for the day. Will attempt again this shift.

## 2021-06-09 ENCOUNTER — Other Ambulatory Visit: Payer: Self-pay | Admitting: Family Medicine

## 2021-06-09 ENCOUNTER — Encounter (HOSPITAL_COMMUNITY): Payer: Self-pay

## 2021-06-09 ENCOUNTER — Other Ambulatory Visit (HOSPITAL_COMMUNITY): Payer: Self-pay

## 2021-06-09 MED ORDER — CARVEDILOL 3.125 MG PO TABS
3.1250 mg | ORAL_TABLET | Freq: Two times a day (BID) | ORAL | 0 refills | Status: DC
  Filled 2021-06-09: qty 60, 30d supply, fill #0

## 2021-06-09 MED ORDER — PANTOPRAZOLE SODIUM 40 MG PO TBEC
40.0000 mg | DELAYED_RELEASE_TABLET | Freq: Two times a day (BID) | ORAL | 0 refills | Status: DC
  Filled 2021-06-09: qty 30, 15d supply, fill #0

## 2021-06-09 NOTE — Progress Notes (Signed)
Pt reports cousin is still coming to pick him up after he gets off work.

## 2021-06-09 NOTE — Progress Notes (Signed)
Patient ID: Patrick [name], male   DOB: 1956-06-09, 65 y.o.   MRN: [id number] S: no acute events. Eager to go home. Eating breakfast O:BP 110/70 (BP Location: Left Arm)    Pulse 80    Temp 98.2 F (36.8 C) (Oral)    Resp 15    Ht 5\' 11"  (1.803 m)    Wt 69 kg    SpO2 98%    BMI 21.22 kg/m   Intake/Output Summary (Last 24 hours) at 06/09/2021 0730 Last data filed at 06/08/2021 1717 Gross per 24 hour  Intake 180 ml  Output 2000 ml  Net -1820 ml   Intake/Output: I/O last 3 completed shifts: In: 180 [P.O.:180] Out: 2000 [Other:2000]  Intake/Output this shift:  No intake/output data recorded. Weight change: 1.119 kg Gen:NAD CVS: RRR Resp:CTA Abd: +BS, mildly distended Ext: no edema, RUE AVF +T/B Neuro: awake, alert  Recent Labs  Lab 06/03/21 0500 06/04/21 0619 06/05/21 0350 06/06/21 0719 06/07/21 1426 06/08/21 0435  NA 137 137 134* 130* 130* 129*  K 2.9* 3.9 4.0 5.5* 4.2 4.4  CL 111 104 104 100 97* 97*  CO2 16* 24 20* 18* 23 22  GLUCOSE 90 105* 96 664* 104* 85  BUN 73* 50* 71* 89* 45* 49*  CREATININE 4.54* 3.71* 4.60* 5.23* 4.82* 4.73*  ALBUMIN  --   --  1.7* 1.6*  --  1.7*  CALCIUM 7.2* 8.4* 8.6* 8.2* 8.3* 8.2*  PHOS 3.6 3.5 4.4 6.5*  --  4.8*  AST  --   --  20  --   --   --   ALT  --   --  16  --   --   --    Liver Function Tests: Recent Labs  Lab 06/05/21 0350 06/06/21 0719 06/08/21 0435  AST 20  --   --   ALT 16  --   --   ALKPHOS 263*  --   --   BILITOT 1.5*  --   --   PROT 5.4*  --   --   ALBUMIN 1.7* 1.6* 1.7*   No results for input(s): LIPASE, AMYLASE in the last 168 hours. No results for input(s): AMMONIA in the last 168 hours. CBC: Recent Labs  Lab 06/04/21 0619 06/05/21 0350 06/06/21 0720 06/07/21 0900 06/08/21 0435  WBC 12.2* 12.4* 9.9 9.3 9.3  HGB 7.4* 7.1* 7.0* 7.6* 7.8*  HCT 21.7* 21.8* 20.5* 22.8* 23.3*  MCV 91.6 91.2 94.5 89.8 89.6  PLT 209 224 217 247 265   Cardiac Enzymes: No results for input(s): CKTOTAL, CKMB, CKMBINDEX,  TROPONINI in the last 168 hours. CBG: Recent Labs  Lab 06/04/21 2018 06/04/21 2328 06/05/21 0348 06/05/21 0728 06/06/21 0914  GLUCAP 119* 118* 93 105* 94    Iron Studies: No results for input(s): IRON, TIBC, TRANSFERRIN, FERRITIN in the last 72 hours. Studies/Results: DG Abd 1 View  Result Date: 06/07/2021 CLINICAL DATA:  Abdominal pain, small-bowel obstruction. EXAM: ABDOMEN - 1 VIEW COMPARISON:  CTA abdomen and pelvis 05/29/2021. FINDINGS: Dilated small bowel loops in the left abdomen measuring up 4 cm appear unchanged. There are surgical clips in the right upper quadrant. There are 3 small linear radiopaque densities in the left abdomen age measuring 9 mm which were not seen on the prior study. No fractures are identified. IMPRESSION: 1. Stable dilated small bowel in the left abdomen compatible with small-bowel obstruction. 2. Three new linear radiopaque densities in the left abdomen may represent external artifact or surgical clips. Please  correlate clinically. Electronically Signed   By: Ronney Asters M.D.   On: 06/07/2021 20:54    acetaminophen  1,000 mg Oral Q6H   apixaban  5 mg Oral BID   arformoterol  15 mcg Nebulization BID   budesonide (PULMICORT) nebulizer solution  0.25 mg Nebulization BID   carvedilol  3.125 mg Oral BID WC   Chlorhexidine Gluconate Cloth  6 each Topical Daily   darbepoetin (ARANESP) injection - DIALYSIS  200 mcg Intravenous Q Tue-HD   feeding supplement  1 Container Oral TID BM   mouth rinse  15 mL Mouth Rinse BID   methocarbamol  1,000 mg Oral QID   pantoprazole  40 mg Intravenous Q12H   revefenacin  175 mcg Nebulization Daily   sodium chloride flush  10-40 mL Intracatheter Q12H    BMET    Component Value Date/Time   NA 129 (L) 06/08/2021 0435   NA 135 05/24/2020 1119   K 4.4 06/08/2021 0435   CL 97 (L) 06/08/2021 0435   CO2 22 06/08/2021 0435   GLUCOSE 85 06/08/2021 0435   BUN 49 (H) 06/08/2021 0435   BUN 25 05/24/2020 1119   CREATININE  4.73 (H) 06/08/2021 0435   CREATININE 2.90 (H) 08/11/2015 1359   CALCIUM 8.2 (L) 06/08/2021 0435   GFRNONAA 13 (L) 06/08/2021 0435   GFRNONAA 23 (L) 08/11/2015 1359   GFRAA 24 (L) 05/24/2020 1119   GFRAA 27 (L) 08/11/2015 1359   CBC    Component Value Date/Time   WBC 9.3 06/08/2021 0435   RBC 2.60 (L) 06/08/2021 0435   HGB 7.8 (L) 06/08/2021 0435   HGB 9.1 (L) 05/24/2020 1119   HCT 23.3 (L) 06/08/2021 0435   HCT 25.6 (L) 05/24/2020 1119   PLT 265 06/08/2021 0435   PLT 88 (LL) 05/24/2020 1119   MCV 89.6 06/08/2021 0435   MCV 124 (H) 05/24/2020 1119   MCH 30.0 06/08/2021 0435   MCHC 33.5 06/08/2021 0435   RDW 17.7 (H) 06/08/2021 0435   RDW 14.6 05/24/2020 1119   LYMPHSABS 0.6 (L) 05/31/2021 0238   LYMPHSABS 0.9 05/24/2020 1119   MONOABS 1.7 (H) 05/31/2021 0238   EOSABS 0.4 05/31/2021 0238   EOSABS 0.1 05/24/2020 1119   BASOSABS 0.1 05/31/2021 0238   BASOSABS 0.1 05/24/2020 1119   OP HD: TTS AF   4h  400/500   65kg  2/2 bath  P2  AVF  Hep none - Sensipar 30mg  PO q HD - Venofer 50mg  IV weekly - Mircera 281mcg IV q 2 weeks (last 11/8)   Assessment/ Plan SBO/ sp ex lap w/ cholecystectomy and SB resection - on 12/07, done for gangrenous GB and chronic SB ischemia. Having BM's and advancing diet but reports poor appetite and some abdominal pain today.  Off of TPN and now taking solids po without N/V.  Off of abx and awaiting WBC and if rising repeat CT of abdomen.  Per primary service SP recent gallstone with CBD &pancreatic duct stenting UGIB - d/t AVM in stomach, rx'd by GI w/ APC/ endoclips/ epi sq on 12/12  ESRD: usual HD TTS. Continue with outpatient HD schedule.  HTN/volume: post HD edw 12/22 was 69kg--can target this Anemia CKD/ sickle cell/ abl-  Aranesp with HD weekly on Wed, last 12/20.  sp 9u prbc's total here. Transfuse prn Hypokalemia - resolved  Metabolic bone disease: Ca/Phos ok - resume binders   Nutrition: on TPN, NG out, taking in PO  A-fib: per cardiology   Disposition -  hopeful discharge to home soon.   Gean Quint, MD [facility]

## 2021-06-09 NOTE — Progress Notes (Signed)
Contacted [facility] to make clinic aware of pt's d/c today and to resume care tomorrow.   Melven Sartorius Renal Navigator [phone]

## 2021-06-09 NOTE — Progress Notes (Signed)
Patrick [name] Risk analyst (cousin) has been educated and demonstration done on pt wound care and how to change dressing.... Cousin verb understanding

## 2021-06-09 NOTE — Progress Notes (Signed)
Progress Note  11 Days Post-Op  Subjective: Cc- NAEO. Overall feels his pain is improving. Tolerating PO and having BMs.   Objective: Vital signs in last 24 hours: Temp:  [97.5 F (36.4 C)-98.4 F (36.9 C)] 97.7 F (36.5 C) (12/23 0749) Pulse Rate:  [75-100] 92 (12/23 0751) Resp:  [11-20] 13 (12/23 0751) BP: (95-125)/(52-73) 105/71 (12/23 0749) SpO2:  [95 %-99 %] 98 % (12/23 0751) Weight:  [69 kg] 69 kg (12/22 1205) Last BM Date: 06/09/21  Intake/Output from previous day: 12/22 0701 - 12/23 0700 In: 180 [P.O.:180] Out: 2000  Intake/Output this shift: No intake/output data recorded.  PE: General: NAD Abd: soft, overall non-tender, midline wound debrided using an 11 blade scalpel. A 1.0 cm x 1.0 cm area of necrosis was removed for the proximal aspect of the incision. Some fibrinopurulent exudate < 0.5 cm was removed from the inferior most aspect of the wound. No bleeding. Patient tolerated this well. Fascia in tact. Moist-to-dry dressing replaced by me.     Lab Results:  Recent Labs    06/07/21 0900 06/08/21 0435  WBC 9.3 9.3  HGB 7.6* 7.8*  HCT 22.8* 23.3*  PLT 247 265   BMET Recent Labs    06/07/21 1426 06/08/21 0435  NA 130* 129*  K 4.2 4.4  CL 97* 97*  CO2 23 22  GLUCOSE 104* 85  BUN 45* 49*  CREATININE 4.82* 4.73*  CALCIUM 8.3* 8.2*   PT/INR No results for input(s): LABPROT, INR in the last 72 hours. CMP     Component Value Date/Time   NA 129 (L) 06/08/2021 0435   NA 135 05/24/2020 1119   K 4.4 06/08/2021 0435   CL 97 (L) 06/08/2021 0435   CO2 22 06/08/2021 0435   GLUCOSE 85 06/08/2021 0435   BUN 49 (H) 06/08/2021 0435   BUN 25 05/24/2020 1119   CREATININE 4.73 (H) 06/08/2021 0435   CREATININE 2.90 (H) 08/11/2015 1359   CALCIUM 8.2 (L) 06/08/2021 0435   PROT 5.4 (L) 06/05/2021 0350   PROT 7.5 05/24/2020 1119   ALBUMIN 1.7 (L) 06/08/2021 0435   ALBUMIN 4.1 05/24/2020 1119   AST 20 06/05/2021 0350   ALT 16 06/05/2021 0350   ALKPHOS  263 (H) 06/05/2021 0350   BILITOT 1.5 (H) 06/05/2021 0350   BILITOT 2.4 (H) 05/24/2020 1119   GFRNONAA 13 (L) 06/08/2021 0435   GFRNONAA 23 (L) 08/11/2015 1359   GFRAA 24 (L) 05/24/2020 1119   GFRAA 27 (L) 08/11/2015 1359   Lipase     Component Value Date/Time   LIPASE 51 05/19/2021 1445       Studies/Results: DG Abd 1 View  Result Date: 06/07/2021 CLINICAL DATA:  Abdominal pain, small-bowel obstruction. EXAM: ABDOMEN - 1 VIEW COMPARISON:  CTA abdomen and pelvis 05/29/2021. FINDINGS: Dilated small bowel loops in the left abdomen measuring up 4 cm appear unchanged. There are surgical clips in the right upper quadrant. There are 3 small linear radiopaque densities in the left abdomen age measuring 9 mm which were not seen on the prior study. No fractures are identified. IMPRESSION: 1. Stable dilated small bowel in the left abdomen compatible with small-bowel obstruction. 2. Three new linear radiopaque densities in the left abdomen may represent external artifact or surgical clips. Please correlate clinically. Electronically Signed   By: Ronney Asters M.D.   On: 06/07/2021 20:54    Anti-infectives: Anti-infectives (From admission, onward)    Start     Dose/Rate Route Frequency Ordered Stop  05/24/21 1730  piperacillin-tazobactam (ZOSYN) IVPB 2.25 g  Status:  Discontinued        2.25 g 100 mL/hr over 30 Minutes Intravenous Every 8 hours 05/24/21 1654 05/29/21 1303   05/24/21 1615  metroNIDAZOLE (FLAGYL) IVPB 500 mg       Note to Pharmacy: Please tube to #[phone]    500 mg 100 mL/hr over 60 Minutes Intravenous  Once 05/24/21 1603 05/24/21 1630        Assessment/Plan POD 15, s/p dx lap converted to ex lap for SBR and cholecystectomy for gangrenous cholecystitis and chronic ischemic segment of SB causing an obstruction, Dr. Donne Hazel 05/24/21 - [facility] - BID damp to dry dressing changes to midline - soft diet  - mobilize/pulm toilet - zosyn stopped 12/12 after 5 days post op  completed, wbc normalized/ afebrile - Multimodal pain regimen, wean narcotics as able. Benign exam and afebrile/no WBC, had negative CTA POD 5.  - stable for discharge from a general surgery perspective. Cousing coming to learn wound care this evening. Pain meds and follow up appointment provided. Wound care education provided.   FEN: soft diet VTE: SCDs, eliquis resumed 12/20, CBC pending ID: [email or web address], completed 12/12   UGI bleed - secondary to AVM in the gastric body, s/p tx by GI 12/15.   ESRD - TTS, per renal CHF - per cards A. Fib on eliquis, resumed 12/20 Acute blood loss Anemia - hgb stable 7 Acute hypoxic respiratory failure - extubated and doing well   LOS: 21 days    Jill Alexanders, [facility] 06/09/2021, 8:43 AM Please see Amion for pager number during day hours 7:00am-4:30pm

## 2021-06-09 NOTE — Progress Notes (Signed)
Pt's cousin, Lona Millard, just arrived to pick pt up and take home

## 2021-06-09 NOTE — Discharge Summary (Signed)
Discharge Summary  Patrick [name] WPV:[id number] DOB: 10/07/1955  PCP: Dorena Dew, FNP  Admit date: 05/19/2021 Discharge date: 06/09/2021  Time spent: 63mns, more than 50% time spent on coordination of care.   Recommendations for Outpatient Follow-up:  F/u with PCP within a week  for hospital discharge follow up, repeat cbc/bmp at follow up F/u general surgery Dr. WDonne HazelF/u with [facility] Follow-up with cardiology/heart failure clinic Dr. BHaroldine LawsContinue outpatient dialysis TTS Home health orders placed   Discharge Diagnoses:  Active Hospital Problems   Diagnosis Date Noted   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 05/24/2021 05/27/2021   Empyema of gallbladder 05/27/2021   Acute gangrenous cholecystitis s/p open cholcystectomy 05/24/2021 05/27/2021   ABLA (acute blood loss anemia)    Protein-calorie malnutrition, severe 05/23/2021   SBO (small bowel obstruction) ([facility] 05/19/2021   Upper GI bleed 074/82/7078  Chronic systolic CHF (congestive heart failure) ([place] 09/11/2019   Permanent atrial fibrillation ([facility] 12/24/2015   End stage renal disease ([facility] 05/06/2014   Anemia 09/25/2013   Nausea & vomiting 02/07/2013   Abdominal pain 01/17/2012    Resolved Hospital Problems  No resolved problems to display.    Discharge Condition: stable  Diet recommendation: Renal diet  Filed Weights   06/08/21 0500 06/08/21 0827 06/08/21 1205  Weight: 70.1 kg 71.2 kg 69 kg    History of present illness:  Patrick D MHandsis a 65y.o. male who is legally blind with h/o sickle cell anemia,,  A. fib on Eliquis, combined CHF, ESRD on HD TTS who was recently hospitalized in [place] for Choledocholithiasis w recent ERCP/Sphinct/CBD and PD stenting/stone extractio, he declined cholecystectomy at that time., [facility] pain, found to have SBO and gangrenous cholecystitis  S/p Diagnostic laparoscopy,  Small bowel  resection with primary anastomosis,   Open cholecystectomy on 12/7, hospital course complicated by postop ileus, GI bleed require EGD, he eventually improved, and desire to go home with home health.  Hospital Course:  Principal Problem:   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 05/24/2021 Active Problems:   Abdominal pain   Nausea & vomiting   Anemia   End stage renal disease (HCC)   Permanent atrial fibrillation (HCC)   Chronic systolic CHF (congestive heart failure) (HCC)   Upper GI bleed   SBO (small bowel obstruction) (HCC)   Protein-calorie malnutrition, severe   ABLA (acute blood loss anemia)   Empyema of gallbladder   Acute gangrenous cholecystitis s/p open cholcystectomy 05/24/2021   SBO/gangrenous cholecystitis/post op ileus  -S/p Diagnostic laparoscopy,  Small bowel resection with primary anastomosis,   Open cholecystectomy on 12/7 -was on ng suction and tpn -he has improved, now tolerating diet, having bm and + flatus -He desired to go home with home health , family comes in to learn wound care, home health set up, general surgery cleared him to discharge with close outpatient follow-up   Acute upper GI bleed -Noted to have significant amount of blood coming out from NG tube on 12/11 -Transfer to ICU, had a EGD on 12/12 showed significant amount of blood in the stomach, lavaged and AVM bleeding in the stomach.  Unclear if due to trauma from NG causing this.  Treated endoscopically.   -IV PPI fusion transition to PPI twice daily.  - Eliquis resumed 12/20 -hgb has been stable, reports stool is returning to normal color -Discharged on oral PPI, outpatient follow-up with  GI   Afib Remain in afib, rate controlled  Was On and off amiodarone drip for rate control, when npo,  Anticoagulation held due to anemia and recent surgery, resumed on 12/20 Cardiology was following, signed off on 12/19  resumed low dose coreg at discharge, f/u with  cardiology    Chronic  combined chf/Pulmonary HTN -Echo this admit suggestive of restrictive CM with EF 40-45%, RV mildly decreased, severe MR, severe TR, RVSP 55 mmHg -Volume managed by dialysis Cardiology/heart failure team was following, signed off on 12/19   HTN: bp stable off all home bp meds, Resumed low dose  Coreg at discharge   ESRD on HD TTS Electrolytes abnormalities  HD per Nephrology    Acute blood loss anemia / history of sickle cell anemia and anemia of chronic disease Prbcx10units so far FFP x2 on 12/7 Hgb has been stable     Sickle cell anemia Home medication Hydrea held in the hospital, resumed at discahrge F/u with sickle cell clinic, appointment made.   Legally blind, reports lives by himself   COPD   quit smoking in 2017 No wheezing, no cough, on room air Continue home regimen       Nutritional Assessment:   The patients BMI is: Body mass index is 21.22 kg/m.Marland Kitchen   Seen by dietician.  I agree with the assessment and plan as outlined below:   Nutrition Status: Nutrition Problem: Severe Malnutrition Etiology: acute illness (SBO) Signs/Symptoms: moderate fat depletion, moderate muscle depletion, percent weight loss (8% weight loss within 3 months) Percent weight loss: 8 % Interventions: was on TPN,now tolerating po   .         DVT prophylaxis: SCDs Start: 05/19/21 2116 apixaban (ELIQUIS) tablet 5 mg    Code Status:full    Consultants:  Cardiology Critical care GI General surgery Nephrology   Procedures:  See above  Discharge Exam: BP 105/71 (BP Location: Left Arm)    Pulse 92    Temp 98 F (36.7 C) (Oral)    Resp 10    Ht 5' 11"  (1.803 m)    Wt 69 kg    SpO2 98%    BMI 21.22 kg/m   General: NAD, AAOx3, pleasant, legally blind Cardiovascular: IRRR, rate controlled Respiratory: Normal respiratory effort  Discharge Instructions You were cared for by a hospitalist during your hospital stay. If you have any questions about your discharge medications or  the care you received while you were in the hospital after you are discharged, you can call the unit and asked to speak with the hospitalist on call if the hospitalist that took care of you is not available. Once you are discharged, your primary care physician will handle any further medical issues. Please note that NO REFILLS for any discharge medications will be authorized once you are discharged, as it is imperative that you return to your primary care physician (or establish a relationship with a primary care physician if you do not have one) for your aftercare needs so that they can reassess your need for medications and monitor your lab values.  Discharge Instructions     Abdominal pads   Complete by: As directed    Diet general   Complete by: As directed    Soft diet   Discharge wound care:   Complete by: As directed    Damp to dry dressing changes to midline abdominal wound daily   Gauze   Complete by: As directed    Increase activity slowly  Complete by: As directed    NORMAL SALINE   Complete by: As directed       Allergies as of 06/09/2021   No Known Allergies      Medication List     STOP taking these medications    hydrALAZINE 25 MG tablet Commonly known as: APRESOLINE   isosorbide mononitrate 30 MG 24 hr tablet Commonly known as: IMDUR   metolazone 5 MG tablet Commonly known as: ZAROXOLYN   torsemide 20 MG tablet Commonly known as: DEMADEX       TAKE these medications    acetaminophen 500 MG tablet Commonly known as: TYLENOL Take 1,000 mg by mouth every 6 (six) hours as needed for moderate pain or headache.   albuterol 108 (90 Base) MCG/ACT inhaler Commonly known as: VENTOLIN HFA Inhale 2 puffs into the lungs every 6 (six) hours as needed for wheezing or shortness of breath.   allopurinol 100 MG tablet Commonly known as: ZYLOPRIM TAKE 1 TABLET BY MOUTH EVERY DAY   Auryxia 1 GM 210 MG(Fe) tablet Generic drug: ferric citrate Take 420 mg by  mouth 3 (three) times daily.   Breztri Aerosphere 160-9-4.8 MCG/ACT Aero Generic drug: Budeson-Glycopyrrol-Formoterol Inhale 2 puffs into the lungs in the morning and at bedtime.   carvedilol 3.125 MG tablet Commonly known as: COREG Take 1 tablet (3.125 mg total) by mouth 2 (two) times daily with a meal. What changed:  medication strength how much to take   diphenhydramine-acetaminophen 25-500 MG Tabs tablet Commonly known as: TYLENOL PM Take 2 tablets by mouth at bedtime as needed (sleep).   Eliquis 5 MG Tabs tablet Generic drug: apixaban TAKE 1 TABLET BY MOUTH TWICE A DAY   folic acid 1 MG tablet Commonly known as: FOLVITE TAKE 1 TABLET BY MOUTH EVERY DAY   gabapentin 100 MG capsule Commonly known as: NEURONTIN TAKE 1 CAPSULE BY MOUTH EVERYDAY AT BEDTIME   hydroxyurea 500 MG capsule Commonly known as: HYDREA Take 1 capsule (500 mg total) by mouth daily. TAKE 1 CAPSULE BY MOUTH EVERY DAY WITH FOOD TO MINIMIZE GI SIDE EFFECTS   lidocaine-prilocaine cream Commonly known as: EMLA Apply 1 application topically daily as needed (port access).   oxyCODONE 5 MG immediate release tablet Commonly known as: Oxy IR/ROXICODONE Take 2 tablets (10 mg total) by mouth every 6 (six) hours as needed for up to 7 days for severe pain (not relieved by tylenol). What changed:  medication strength when to take this reasons to take this   pantoprazole 40 MG tablet Commonly known as: Protonix Take 1 tablet (40 mg total) by mouth 2 (two) times daily.   tiZANidine 4 MG tablet Commonly known as: ZANAFLEX TAKE 0.5 TABLET BY MOUTH EVERY 8 HOURS AS NEEDED FOR MUSCLE SPASMS What changed:  how much to take how to take this when to take this reasons to take this additional instructions   Vitamin D (Ergocalciferol) 1.25 MG (50000 UNIT) Caps capsule Commonly known as: DRISDOL TAKE 1 CAPSULE BY MOUTH ONE TIME PER WEEK What changed: See the new instructions.               Discharge  Care Instructions  (From admission, onward)           Start     Ordered   06/09/21 0000  Discharge wound care:       Comments: Damp to dry dressing changes to midline abdominal wound daily   06/09/21 0827  No Known Allergies  Follow-up Information     Rolm Bookbinder, MD. Call.   Specialty: General Surgery Why: We are working to make your follow up appointment for you, please call to confirm appointment date and time Contact information: [place] [address] [phone]         Dorena Dew, FNP Follow up in 1 week(s).   Specialty: Family Medicine Why: hospital discharge follow up Contact information: [place] [id number] [phone]         continue dialysis TTS Follow up.          [facility] Follow up in 1 month(s).   Specialty: Gastroenterology Why: please call to make appointment with GI to follow up on GI bleed Contact information: [place] [phone] Gabbs, Shaune Pascal, MD Follow up in 1 month(s).   Specialty: Cardiology Why: afib, pulmonary hypertension and heart failure management Contact information: [address] [place] [id number] [place], [facility] Follow up.   Specialty: [place] Why: Home Health RN-agency will call to arrange appt Contact information: [place]  [id number] [phone]                  The results of significant diagnostics from this hospitalization (including imaging, microbiology, ancillary and laboratory) are listed below for reference.    Significant Diagnostic Studies: DG Abd 1 View  Result Date: 06/07/2021 CLINICAL DATA:  Abdominal pain, small-bowel obstruction. EXAM: ABDOMEN - 1 VIEW COMPARISON:  CTA abdomen and pelvis 05/29/2021. FINDINGS: Dilated small bowel loops in the left  abdomen measuring up 4 cm appear unchanged. There are surgical clips in the right upper quadrant. There are 3 small linear radiopaque densities in the left abdomen age measuring 9 mm which were not seen on the prior study. No fractures are identified. IMPRESSION: 1. Stable dilated small bowel in the left abdomen compatible with small-bowel obstruction. 2. Three new linear radiopaque densities in the left abdomen may represent external artifact or surgical clips. Please correlate clinically. Electronically Signed   By: Ronney Asters M.D.   On: 06/07/2021 20:54   DG Abd 1 View  Result Date: 05/28/2021 CLINICAL DATA:  NG placement. EXAM: ABDOMEN - 1 VIEW COMPARISON:  Abdominal radiograph dated 05/24/2021. FINDINGS: Enteric tube with side port in the proximal stomach tip in the left upper abdomen, likely in the body of the stomach. Dilated and air distended loops of small bowel in the left abdomen measure up to 5.5 cm in diameter. These have increased in caliber compared to prior radiograph. Right upper quadrant cholecystectomy clips and a CBD stent is noted. Mixed density containing air in the left upper abdomen, likely gastric content. No definite free air. Cardiomegaly with vascular congestion. IMPRESSION: 1. Enteric tube with tip in the body of the stomach. 2. Dilated small bowel loops in the left abdomen, increased in caliber compared to prior radiograph. Electronically Signed   By: Anner Crete M.D.   On: 05/28/2021 19:18   DG Abd 1 View  Result Date: 05/21/2021 CLINICAL DATA:  Small bowel obstruction. EXAM: ABDOMEN - 1 VIEW COMPARISON:  05/20/2021 FINDINGS: 0857 hours. Relatively gasless abdomen consistent with the diffuse fluid-filled small bowel seen on CT scan earlier today. NG tube tip is in the stomach with proximal side port of  the NG tube at or just above the GE junction. Common bile duct stent again noted. IMPRESSION: Relatively gasless abdomen consistent with diffuse fluid-filled small bowel  seen on CT scan earlier today. Proximal side port of the NG tube is at or just above the GE junction. NG tube could be advanced 3-4 cm to ensure side port placement below the GE junction. Electronically Signed   By: Misty Stanley M.D.   On: 05/21/2021 10:51   CT ABDOMEN PELVIS W CONTRAST  Result Date: 05/21/2021 CLINICAL DATA:  Abdominal pain EXAM: CT ABDOMEN AND PELVIS WITH CONTRAST TECHNIQUE: Multidetector CT imaging of the abdomen and pelvis was performed using the standard protocol following bolus administration of intravenous contrast. CONTRAST:  69m ISOVUE-370 IOPAMIDOL (ISOVUE-370) INJECTION 76% COMPARISON:  05/19/2021 FINDINGS: LOWER CHEST: Moderate cardiomegaly HEPATOBILIARY: There is a common bile duct stent. There are stones and air within the gallbladder. Large amount of upper ascites, increased. Mild hepatic nodularity. PANCREAS: Normal pancreas. Pancreatic stent in unchanged position. No ductal dilatation or peripancreatic fluid collection. SPLEEN: Calcified splenic remnant ADRENALS/URINARY TRACT: The adrenal glands are normal. No hydronephrosis, nephroureterolithiasis or solid renal mass. The urinary bladder is normal for degree of distention STOMACH/BOWEL: There is no hiatal hernia. Normal duodenal course and caliber. There is diffusely dilated small bowel, slightly progressed from the prior study. The terminal ileum is decompressed. No focal colonic abnormality. Normal appendix. VASCULAR/LYMPHATIC: Normal course and caliber of the major abdominal vessels. No abdominal or pelvic lymphadenopathy. REPRODUCTIVE: Normal prostate size with symmetric seminal vesicles. MUSCULOSKELETAL. Bone marrow changes reflecting sickle cell disease. OTHER: None. IMPRESSION: 1. Slightly progressed, diffusely dilated small bowel with decompressed terminal ileum, consistent with small bowel obstruction. 2. Large amount of upper abdominal ascites, increased from the prior study. 3. Moderate cardiomegaly. Aortic  Atherosclerosis (ICD10-I70.0). Electronically Signed   By: KUlyses JarredM.D.   On: 05/21/2021 01:38   CT Abdomen Pelvis W Contrast  Result Date: 05/19/2021 CLINICAL DATA:  Abdominal pain with nausea and vomiting, possible small bowel obstruction EXAM: CT ABDOMEN AND PELVIS WITH CONTRAST TECHNIQUE: Multidetector CT imaging of the abdomen and pelvis was performed using the standard protocol following bolus administration of intravenous contrast. CONTRAST:  1063mOMNIPAQUE IOHEXOL 300 MG/ML  SOLN COMPARISON:  09/19/2017, plain film from earlier in the same day. FINDINGS: Lower chest: Mild bibasilar atelectasis right greater than left. No focal parenchymal nodule is seen. Hepatobiliary: Diffuse decreased attenuation is noted within the liver without focal mass likely related to fatty infiltration. Common bile duct stent is noted in place. Air is noted within the biliary tree as well as within the gallbladder related to the biliary stent. Multiple gallstones are seen. Perihepatic ascites is noted. Pancreas: Pancreatic stent is noted centrally. More peripheral pancreas is within normal limits. 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 Adrenals/Urinary Tract: Adrenal glands are within normal limits. Kidneys show no renal calculi or obstructive changes. Scattered cysts are noted bilaterally. Ureters are within normal limits. The bladder is partially distended. Stomach/Bowel: Colon shows no obstructive or inflammatory changes. The appendix is within normal limits without inflammatory change. Small bowel demonstrates diffuse small bowel dilatation with fecalization of bowel contents consistent with high-grade small bowel obstruction. There is a transition zone noted in the right mid abdomen best seen on image number 33 of series 6 and image number 42 of series 3. No discrete mass is noted in this region. Stomach is well distended with fluid. Gastric catheter is noted within the distal esophagus and  should  be advanced several cm deeper into the stomach. Vascular/Lymphatic: Aortic atherosclerosis. No enlarged abdominal or pelvic lymph nodes. Reproductive: Prostate is unremarkable. Other: No discrete hernia is noted. Ascites is seen likely of a reactive nature related to the small-bowel obstruction. No findings to suggest perforation are noted at this time. Musculoskeletal: Patchy sclerotic changes are noted within the bony structures similar to that seen on prior exam. This is likely related to the patient's underlying known history of sickle cell. IMPRESSION: Changes consistent with a small-bowel obstruction in the mid ileum. This is high-grade in nature with fecalization of bowel contents. The transition zone shows no mass. These changes are likely related to adhesions or underlying scarring. Cholelithiasis with evidence of pneumobilia secondary to a biliary stent. Pancreatic stent is noted as well. Changes consistent with the known history of sickle cell disease. Gastric catheter is noted within the distal esophagus and should be advanced further into the stomach to allow for decompression. Electronically Signed   By: Inez Catalina M.D.   On: 05/19/2021 20:18   IR US Guide Vasc Access Right  Result Date: 05/23/2021 INDICATION: 65 year old male with end-stage renal disease requiring central venous access for nutrition supplementation. EXAM: 1. Ultrasound-guided venipuncture of the jugular vein 2. Fluoroscopic guided placement of tunneled central venous catheter MEDICATIONS: None. ANESTHESIA/SEDATION: Local anesthesia only. FLUOROSCOPY TIME:  0 minutes 48 seconds (3 mGy). COMPLICATIONS: None immediate. PROCEDURE: Informed written consent was obtained from the patient after a discussion of the risks, benefits, and alternatives to treatment. Questions regarding the procedure were encouraged and answered. The right neck and chest were prepped with chlorhexidine in a sterile fashion, and a sterile drape was  applied covering the operative field. Maximum barrier sterile technique with sterile gowns and gloves were used for the procedure. A timeout was performed prior to the initiation of the procedure. After creating a small venotomy incision, a 21 gauge micropuncture kit was utilized to access the internal jugular vein. Real-time ultrasound guidance was utilized for vascular access including the acquisition of a permanent ultrasound image documenting patency of the accessed vessel. A Mandril wire to the level of the cavoatrial junction. A 6 French tunneled central venous catheter measuring 25 cm from tip to cuff was tunneled in a retrograde fashion from the anterior chest wall to the venotomy incision. A peel-away sheath was placed over the wire. The catheter was then placed through the peel-away sheath with the catheter tip ultimately positioned at the cavoatrial junction. Final catheter positioning was confirmed and documented with a spot radiographic image. The catheter aspirates and flushes normally. The catheter was flushed with appropriate volume heparin dwells. The catheter exit site was secured with a 0-Prolene retention suture. The venotomy incision was closed with Dermabond. Sterile dressings were applied. The patient tolerated the procedure well without immediate post procedural complication. IMPRESSION: Successful placement of 25 cm tip to cuff tunneled 6 French central venous catheter via the right internal jugular vein with catheter tip terminating at the cavoatrial junction. The catheter is ready for immediate use. Ruthann Cancer, MD [facility] Electronically Signed   By: Ruthann Cancer M.D.   On: 05/23/2021 12:30   DG CHEST PORT 1 VIEW  Result Date: 05/28/2021 CLINICAL DATA:  Short of breath, hemoptysis EXAM: PORTABLE CHEST 1 VIEW COMPARISON:  05/25/2021 FINDINGS: Single frontal view of the chest demonstrates stable right internal jugular  catheter tip overlying superior vena cava. Enteric catheter tip projects over gastric fundus. Cardiac silhouette remains enlarged. No  airspace disease, effusion, or pneumothorax. Improved central vascular congestion. No acute bony abnormalities. IMPRESSION: 1. Improved central vascular congestion. 2. Support devices as above. Electronically Signed   By: Randa Ngo M.D.   On: 05/28/2021 20:57   DG Chest Port 1 View  Result Date: 05/25/2021 CLINICAL DATA:  Acute respiratory failure with hypoxia. EXAM: PORTABLE CHEST 1 VIEW COMPARISON:  05/24/2021. FINDINGS: The heart is enlarged and the pulmonary vasculature is distended. Atherosclerotic calcification of the aorta is noted. Mild airspace disease is present at the left lung base. No effusion or pneumothorax is seen. An enteric tube courses over the left upper quadrant and out of the field of view. The endotracheal tube terminates 3.4 cm above the carina. A right internal jugular central venous catheter is stable in position. IMPRESSION: 1. Cardiomegaly with pulmonary vascular congestion. 2. Minimal airspace disease at the left lung base, possible atelectasis, edema, or infiltrate. 3. Medical devices as described above. Electronically Signed   By: Brett Fairy M.D.   On: 05/25/2021 04:21   DG Chest Port 1 View  Result Date: 05/24/2021 CLINICAL DATA:  Status post intubation. EXAM: PORTABLE CHEST 1 VIEW COMPARISON:  Chest radiograph dated 08/01/2020. FINDINGS: Endotracheal tube with tip approximately 6 cm above the carina. Enteric tube extends below the diaphragm with tip in the proximal stomach. Right IJ central venous line with tip at the cavoatrial junction. There is cardiomegaly with mild vascular congestion. No focal consolidation, pleural effusion, or pneumothorax atherosclerotic calcification of the aorta. No acute osseous pathology. IMPRESSION: 1. Endotracheal tube above the carina. 2. Cardiomegaly with mild vascular congestion. No focal  consolidation. Electronically Signed   By: Anner Crete M.D.   On: 05/24/2021 19:00   DG Abd Acute W/Chest  Addendum Date: 05/19/2021   ADDENDUM REPORT: 05/19/2021 17:32 ADDENDUM: These results were called by telephone at the time of interpretation on 05/19/2021 at 5:32 pm to provider [facility] , who verbally acknowledged these results. Electronically Signed   By: Zetta Bills M.D.   On: 05/19/2021 17:32   Result Date: 05/19/2021 CLINICAL DATA:  Small-bowel obstruction suspected. EXAM: DG ABDOMEN ACUTE WITH 1 VIEW CHEST COMPARISON:  Comparison is made with preoperative chest x-ray February 14th of 2022. FINDINGS: Trachea midline. Cardiomediastinal contours and hilar structures with mild cardiomegaly and central pulmonary vascular engorgement. No signs of pneumothorax or consolidative changes. Juxta diaphragmatic atelectasis. No pneumothorax. No acute skeletal process about the bony thorax. Limited imaging due to patient position in stretcher due to patient condition by report. Gas beneath the LEFT hemidiaphragm, just below this are dilated loops of small bowel. This could reflect gas in the stomach move cephalad by the distended bowel loops. Biliary stent is in place to the RIGHT of L1, L2 and L3. Gallstone likely in the RIGHT upper quadrant. Pancreatic stent also in place. EKG leads project cross the patient's abdomen. Multiple air-fluid levels are present throughout the upper abdomen. Corresponding dilated loops of small bowel throughout the abdomen. There is some stool in the colon and a small amount of rectal stool and gas. On limited assessment there is no acute skeletal process. IMPRESSION: Signs of small bowel obstruction. Gas beneath the LEFT hemidiaphragm likely reflects gas in the stomach displaced cephalad by the distended bowel loops. Do not favor free air in this setting but would suggest CT for further evaluation in this patient with signs of bowel obstruction. Biliary and pancreatic  stents. Gallstone in the RIGHT upper quadrant. A call is out to the referring provider  to further discuss findings in the above case. Electronically Signed: By: Zetta Bills M.D. On: 05/19/2021 17:25   DG Abd Portable 1V  Result Date: 05/24/2021 CLINICAL DATA:  Small-bowel obstruction EXAM: PORTABLE ABDOMEN - 1 VIEW COMPARISON:  KUB obtained 1 day prior FINDINGS: The enteric catheter tip is in the stomach. The side hole is at the level of the GE junction. Recommend advancement by approximately 3-4 cm. Enteric contrast is again seen in the small bowel concentrated in the right hemiabdomen. The small bowel remains dilated measuring up to 4.4 cm in maximum diameter the dilated small bowel is similar in caliber to the prior study, though contrast may be present within the ascending colon. A biliary stent is again noted. A calcified gallstone is again seen. The bones are stable. IMPRESSION: 1. Similar degree of dilation of small bowel in the right lower quadrant, but contrast may have passed into the ascending colon. Continued radiographic follow-up is recommended. 2. Enteric catheter side hole is at the level of the GE junction. Recommend advancement by 3-4 cm. These results will be called to the ordering clinician or representative by the Radiologist Assistant, and communication documented in the PACS or [employer]. Electronically Signed   By: Valetta Mole M.D.   On: 05/24/2021 15:18   DG Abd Portable 1V  Result Date: 05/23/2021 CLINICAL DATA:  Abdominal pain and distention EXAM: PORTABLE ABDOMEN - 1 VIEW COMPARISON:  05/22/2021 FINDINGS: There is dilation of small-bowel loops measuring up to 4.4 cm in diameter. Stomach is not distended. Tip of enteric tube is seen in the stomach. Side-port in the enteric tube is at the gastroesophageal junction. Colon is not distended. There is biliary stent in the course of common bile duct. Gallbladder stones are seen. IMPRESSION: There is dilation of small-bowel  loops suggesting ileus or partial small bowel obstruction. Electronically Signed   By: Elmer Picker M.D.   On: 05/23/2021 09:44   DG Abd Portable 1V  Result Date: 05/22/2021 CLINICAL DATA:  Abdominal pain and distention. EXAM: PORTABLE ABDOMEN - 1 VIEW COMPARISON:  May 21, 2021. FINDINGS: No abnormal bowel dilatation is noted. Biliary stent is noted in grossly good position. Distal tip of nasogastric tube is seen in expected position of proximal stomach. Stool is noted throughout the colon. Cholelithiasis is noted. IMPRESSION: Distal tip of nasogastric tube seen in expected position of proximal stomach. Biliary stent is in grossly good position. No abnormal bowel dilatation is noted. Cholelithiasis. Large stool burden is noted. Electronically Signed   By: Marijo Conception M.D.   On: 05/22/2021 10:16   DG Abd Portable 1V-Small Bowel Obstruction Protocol-initial, 8 hr delay  Result Date: 05/20/2021 CLINICAL DATA:  Small-bowel obstruction; 8 hour delay EXAM: PORTABLE ABDOMEN - 1 VIEW COMPARISON:  05/19/2021 FINDINGS: Enteric tube is within the stomach. There is residual enteric contrast. Probable persistent small bowel obstruction is not well evaluated on this supine study. Contrast is also present within the bladder. Calcified gallstone and common bile duct stent. Probable additional small stent within the main pancreatic duct. IMPRESSION: Probable persistent small bowel obstruction is not well evaluated on this supine study. There is residual enteric contrast. Electronically Signed   By: Macy Mis M.D.   On: 05/20/2021 10:15   DG Abd Portable 1V-Small Bowel Protocol-Position Verification  Result Date: 05/19/2021 CLINICAL DATA:  Encounter for imaging study to confirm nasogastric tube placement. EXAM: PORTABLE ABDOMEN - 1 VIEW COMPARISON:  Xr abdomen 05/19/21, CT abdomen pelvis 05/19/2021 FINDINGS: Interval advancement  of an enteric tube coursing below the hemidiaphragm with tip and side port  overlying the expected region the gastric lumen. Side port likely just distal to the gastroesophageal junction. A 1.5 cm calcified density overlying the right upper quadrant consistent with gallstone. The common bile duct stent is noted overlying the right mid abdomen. Known pancreatic duct not well visualized. Cardiomegaly.  Aortic calcification. IMPRESSION: Interval advancement of an enteric tube coursing below the hemidiaphragm with tip and side port overlying the expected region the gastric lumen. Electronically Signed   By: Iven Finn M.D.   On: 05/19/2021 23:57   DG Abd Portable 1 View  Result Date: 05/19/2021 CLINICAL DATA:  Nasogastric tube placement EXAM: PORTABLE ABDOMEN - 1 VIEW COMPARISON:  X-ray abdomen 05/19/2021 FINDINGS: Enteric tube noted coursing below the hemidiaphragm with tip overlying the gastric lumen and side port likely in the region of the distal esophagus/gastroesophageal junction. Query gaseous distension of small bowel within the left upper abdomen. IMPRESSION: 1. Enteric tube coursing below the hemidiaphragm with tip overlying the gastric lumen and side port likely in the region of the distal esophagus/gastroesophageal junction. Recommend advancement by 5 cm. 2. Query gaseous distension of small bowel within the left upper abdomen. Electronically Signed   By: Iven Finn M.D.   On: 05/19/2021 19:48   ECHOCARDIOGRAM COMPLETE  Result Date: 05/23/2021    ECHOCARDIOGRAM REPORT   Patient Name:   MATTIE NOVOSEL Date of Exam: 05/23/2021 Medical Rec #:  [id number]           Height:       71.0 in Accession #:    [id number]          Weight:       141.7 lb Date of Birth:  Jul 05, 1955           BSA:          1.821 m Patient Age:    79 years            BP:           122/77 mmHg Patient Gender: M                   HR:           100 bpm. Exam Location:  Inpatient Procedure: 2D Echo, Cardiac Doppler and Color Doppler Indications:    CHF, PHTN  History:        Patient has prior  history of Echocardiogram examinations, most                 recent 09/12/2019. CHF, Signs/Symptoms:Chest Pain; Risk                 Factors:Hypertension.  Sonographer:    Glo Herring Referring Phys: [id number] LINDSAY NICOLE Bellflower  1. Left ventricular ejection fraction, by estimation, is 40 to 45%. The left ventricle has mildly decreased function. The left ventricle demonstrates global hypokinesis. The left ventricular internal cavity size was moderately dilated. There is moderate  concentric left ventricular hypertrophy. Left ventricular diastolic parameters are indeterminate.  2. Right ventricular systolic function is mildly reduced. The right ventricular size is severely enlarged. There is moderately elevated pulmonary artery systolic pressure.  3. Left atrial size was severely dilated.  4. Right atrial size was severely dilated.  5. The mitral valve is grossly normal. Severe mitral valve regurgitation.  6. Mechanism appears to be secondary MR; there are multuiple jets that in summation suggestive severe regurgitation; only blunting of  the pulmonary vein flow. Consider secondary imaging modality (TEE vs CMR) if procedual intervention is to be considered.  7. The tricuspid valve is abnormal. Tricuspid valve regurgitation is severe.  8. The aortic valve is tricuspid. There is mild calcification of the aortic valve. Aortic valve regurgitation is not visualized. Aortic valve sclerosis is present, with no evidence of aortic valve stenosis.  9. The inferior vena cava is dilated in size with <50% respiratory variability, suggesting right atrial pressure of 15 mmHg. Comparison(s): A prior study was performed on 02/17/20. Prior images reviewed side by side. Similar LV size and dilation. FINDINGS  Left Ventricle: Left ventricular ejection fraction, by estimation, is 40 to 45%. The left ventricle has mildly decreased function. The left ventricle demonstrates global hypokinesis. The left ventricular internal cavity  size was moderately dilated. There is moderate concentric left ventricular hypertrophy. Left ventricular diastolic parameters are indeterminate. Right Ventricle: The right ventricular size is severely enlarged. No increase in right ventricular wall thickness. Right ventricular systolic function is mildly reduced. There is moderately elevated pulmonary artery systolic pressure. The tricuspid regurgitant velocity is 3.15 m/s, and with an assumed right atrial pressure of 15 mmHg, the estimated right ventricular systolic pressure is 25.0 mmHg. Left Atrium: Left atrial size was severely dilated. Right Atrium: Right atrial size was severely dilated. Pericardium: There is no evidence of pericardial effusion. Mitral Valve: There is a secondary MR with multiple jets from ventricular dilation (predominantley at the P2 position) multuple jets; only blunted PV. The mitral valve is grossly normal. Severe mitral valve regurgitation. Tricuspid Valve: The tricuspid valve is abnormal. Tricuspid valve regurgitation is severe. Aortic Valve: The aortic valve is tricuspid. There is mild calcification of the aortic valve. Aortic valve regurgitation is not visualized. Aortic valve sclerosis is present, with no evidence of aortic valve stenosis. Aortic valve mean gradient measures 3.3 mmHg. Aortic valve peak gradient measures 6.2 mmHg. Aortic valve area, by VTI measures 2.44 cm. Pulmonic Valve: The pulmonic valve was grossly normal. Pulmonic valve regurgitation is not visualized. No evidence of pulmonic stenosis. Aorta: The aortic root and ascending aorta are structurally normal, with no evidence of dilitation. Venous: The inferior vena cava is dilated in size with less than 50% respiratory variability, suggesting right atrial pressure of 15 mmHg. IAS/Shunts: The atrial septum is grossly normal.  LEFT VENTRICLE PLAX 2D LVIDd:         6.20 cm      Diastology LVIDs:         4.60 cm      LV e' medial:    10.76 cm/s LV PW:         1.20 cm       LV E/e' medial:  9.8 LV IVS:        1.20 cm      LV e' lateral:   9.86 cm/s LVOT diam:     2.10 cm      LV E/e' lateral: 10.7 LV SV:         56 LV SV Index:   31 LVOT Area:     3.46 cm  LV Volumes (MOD) LV vol d, MOD A2C: 130.0 ml LV vol d, MOD A4C: 161.0 ml LV vol s, MOD A2C: 74.9 ml LV vol s, MOD A4C: 95.6 ml LV SV MOD A2C:     55.1 ml LV SV MOD A4C:     161.0 ml LV SV MOD BP:      60.0 ml RIGHT VENTRICLE  IVC RV Basal diam:  4.90 cm     IVC diam: 3.10 cm RV S prime:     13.43 cm/s LEFT ATRIUM              Index        RIGHT ATRIUM           Index LA diam:        6.10 cm  3.35 cm/m   RA Area:     30.70 cm LA Vol (A2C):   135.0 ml 74.12 ml/m  RA Volume:   105.00 ml 57.65 ml/m LA Vol (A4C):   141.0 ml 77.42 ml/m LA Biplane Vol: 145.0 ml 79.61 ml/m  AORTIC VALVE                    PULMONIC VALVE AV Area (Vmax):    2.28 cm     PV Vmax:       1.41 m/s AV Area (Vmean):   2.06 cm     PV Peak grad:  8.0 mmHg AV Area (VTI):     2.44 cm AV Vmax:           125.00 cm/s AV Vmean:          87.933 cm/s AV VTI:            0.231 m AV Peak Grad:      6.2 mmHg AV Mean Grad:      3.3 mmHg LVOT Vmax:         82.20 cm/s LVOT Vmean:        52.267 cm/s LVOT VTI:          0.163 m LVOT/AV VTI ratio: 0.70  AORTA Ao Root diam: 3.70 cm Ao Asc diam:  3.40 cm MITRAL VALVE                  TRICUSPID VALVE MV Area (PHT): 4.78 cm       TR Peak grad:   39.7 mmHg MV Decel Time: 159 msec       TR Vmax:        315.00 cm/s MR Peak grad:    99.6 mmHg MR Mean grad:    68.0 mmHg    SHUNTS MR Vmax:         499.00 cm/s  Systemic VTI:  0.16 m MR Vmean:        393.7 cm/s   Systemic Diam: 2.10 cm MR PISA:         2.26 cm MR PISA Eff ROA: 17 mm MR PISA Radius:  0.60 cm MV E velocity: 105.00 cm/s Rudean Haskell MD Electronically signed by Rudean Haskell MD Signature Date/Time: 05/23/2021/12:01:17 PM    Final    IR TUNNELED CENTRAL VENOUS CATHETER PLACEMENT  Result Date: 05/23/2021 INDICATION: 65 year old male with  end-stage renal disease requiring central venous access for nutrition supplementation. EXAM: 1. Ultrasound-guided venipuncture of the jugular vein 2. Fluoroscopic guided placement of tunneled central venous catheter MEDICATIONS: None. ANESTHESIA/SEDATION: Local anesthesia only. FLUOROSCOPY TIME:  0 minutes 48 seconds (3 mGy). COMPLICATIONS: None immediate. PROCEDURE: Informed written consent was obtained from the patient after a discussion of the risks, benefits, and alternatives to treatment. Questions regarding the procedure were encouraged and answered. The right neck and chest were prepped with chlorhexidine in a sterile fashion, and a sterile drape was applied covering the operative field. Maximum barrier sterile technique with sterile gowns and gloves were used for the procedure. A timeout was performed prior  to the initiation of the procedure. After creating a small venotomy incision, a 21 gauge micropuncture kit was utilized to access the internal jugular vein. Real-time ultrasound guidance was utilized for vascular access including the acquisition of a permanent ultrasound image documenting patency of the accessed vessel. A Mandril wire to the level of the cavoatrial junction. A 6 French tunneled central venous catheter measuring 25 cm from tip to cuff was tunneled in a retrograde fashion from the anterior chest wall to the venotomy incision. A peel-away sheath was placed over the wire. The catheter was then placed through the peel-away sheath with the catheter tip ultimately positioned at the cavoatrial junction. Final catheter positioning was confirmed and documented with a spot radiographic image. The catheter aspirates and flushes normally. The catheter was flushed with appropriate volume heparin dwells. The catheter exit site was secured with a 0-Prolene retention suture. The venotomy incision was closed with Dermabond. Sterile dressings were applied. The patient tolerated the procedure well without  immediate post procedural complication. IMPRESSION: Successful placement of 25 cm tip to cuff tunneled 6 French central venous catheter via the right internal jugular vein with catheter tip terminating at the cavoatrial junction. The catheter is ready for immediate use. Ruthann Cancer, MD [facility] Electronically Signed   By: Ruthann Cancer M.D.   On: 05/23/2021 12:30   CT Angio Abd/Pel w/ and/or w/o  Result Date: 05/29/2021 CLINICAL DATA:  Upper GI bleed, post small bowel resection, rule out retroperitoneal bleed EXAM: CTA ABDOMEN AND PELVIS WITHOUT AND WITH CONTRAST TECHNIQUE: Multidetector CT imaging of the abdomen and pelvis was performed using the standard protocol during bolus administration of intravenous contrast. Multiplanar reconstructed images and MIPs were obtained and reviewed to evaluate the vascular anatomy. CONTRAST:  135mL OMNIPAQUE IOHEXOL 350 MG/ML SOLN COMPARISON:  05/21/2021 FINDINGS: VASCULAR Mild four-chamber cardiac enlargement. Aorta: Mild scattered calcified atheromatous plaque. No aneurysm, dissection, or stenosis. Celiac: Patent without evidence of aneurysm, dissection, vasculitis or significant stenosis. Left gastric artery arises directly from the aorta, an anatomic variant. SMA: Patent without evidence of aneurysm, dissection, vasculitis or significant stenosis. Mild calcified plaque Renals: Both renal arteries are patent without evidence of aneurysm, dissection, vasculitis, fibromuscular dysplasia or significant stenosis. Mild nonocclusive calcified plaque proximally. IMA: Patent without evidence of aneurysm, dissection, vasculitis or significant stenosis. Inflow: Nonocclusive calcified plaque in bilateral common and internal iliac arteries. No aneurysm, dissection, stenosis, or active extravasation. Proximal Outflow: Mildly atheromatous, patent. Veins: Patent hepatic veins, portal vein, SM V, bilateral renal veins, iliac  venous system, and IVC. Review of the MIP images confirms the above findings. NON-VASCULAR Lower chest: Small bilateral pleural effusions. Subsegmental atelectasis/consolidation posteriorly in the lung bases, right greater than left. Hepatobiliary: Post cholecystectomy. No focal liver lesion or biliary ductal dilatation. Pancreas: Unremarkable. No pancreatic ductal dilatation or surrounding inflammatory changes. Spleen: Small with coarse calcifications Adrenals/Urinary Tract: Adrenal glands unremarkable. Stable small renal cysts. No urolithiasis or hydronephrosis. Urinary bladder physiologically distended. Stomach/Bowel: Nasogastric tube extends into the stomach. Stomach is partially distended by gas and debris. Endoscopic clips in the proximal duodenum. Dilated loops of mid small bowel predominantly in the left abdomen, measure up to 4.3 cm diameter in the left lower quadrant. Distal loops of ileum are decompressed. No discrete transition point is identified. Surgical staple lines in the right lower quadrant. Chronic appendicoliths in the appendix without acute change. The colon is nondilated, unremarkable. Lymphatic: No abdominal or pelvic adenopathy. Reproductive: Prostate is unremarkable. Other: Small volume  perihepatic ascites. Small amount of free intraperitoneal gas in the upper abdomen presumably postoperative. Musculoskeletal: Left mid abdominal surgical drain extends up to the subhepatic space. Hemostasis material in the gallbladder fossa. Coarse sclerotic changes throughout the visualized spine and in the pelvis presumably related to chronic renal disease. No fracture or worrisome bone lesion. IMPRESSION: 1. Negative for retroperitoneal hematoma or   active extravasation. 2. Dilated mid small bowel loops, decompressed distally, without discrete transition point identified. 3. Small volume perihepatic ascites, and small pleural effusions. 4.  Aortic Atherosclerosis (ICD10-170.0). Electronically Signed    By: Lucrezia Europe M.D.   On: 05/29/2021 07:50    Microbiology: No results found for this or any previous visit (from the past 240 hour(s)).   Labs: Basic Metabolic Panel: Recent Labs  Lab 06/03/21 0500 06/04/21 0619 06/05/21 0350 06/06/21 0719 06/07/21 1426 06/08/21 0435  NA 137 137 134* 130* 130* 129*  K 2.9* 3.9 4.0 5.5* 4.2 4.4  CL 111 104 104 100 97* 97*  CO2 16* 24 20* 18* 23 22  GLUCOSE 90 105* 96 664* 104* 85  BUN 73* 50* 71* 89* 45* 49*  CREATININE 4.54* 3.71* 4.60* 5.23* 4.82* 4.73*  CALCIUM 7.2* 8.4* 8.6* 8.2* 8.3* 8.2*  MG 1.7 2.2 2.2  --  1.8  --   PHOS 3.6 3.5 4.4 6.5*  --  4.8*   Liver Function Tests: Recent Labs  Lab 06/05/21 0350 06/06/21 0719 06/08/21 0435  AST 20  --   --   ALT 16  --   --   ALKPHOS 263*  --   --   BILITOT 1.5*  --   --   PROT 5.4*  --   --   ALBUMIN 1.7* 1.6* 1.7*   No results for input(s): LIPASE, AMYLASE in the last 168 hours. No results for input(s): AMMONIA in the last 168 hours. CBC: Recent Labs  Lab 06/04/21 0619 06/05/21 0350 06/06/21 0720 06/07/21 0900 06/08/21 0435  WBC 12.2* 12.4* 9.9 9.3 9.3  HGB 7.4* 7.1* 7.0* 7.6* 7.8*  HCT 21.7* 21.8* 20.5* 22.8* 23.3*  MCV 91.6 91.2 94.5 89.8 89.6  PLT 209 224 217 247 265   Cardiac Enzymes: No results for input(s): CKTOTAL, CKMB, CKMBINDEX, TROPONINI in the last 168 hours. BNP: BNP (last 3 results) Recent Labs    08/01/20 2047  BNP 2,730.5*    ProBNP (last 3 results) No results for input(s): PROBNP in the last 8760 hours.  CBG: Recent Labs  Lab 06/04/21 2018 06/04/21 2328 06/05/21 0348 06/05/21 0728 06/06/21 0914  GLUCAP 119* 118* 93 105* 94       Signed:  Florencia Reasons MD, PhD, FACP  [facility] 06/09/2021, 2:01 PM

## 2021-06-09 NOTE — TOC Transition Note (Signed)
Transition of Care [facility]) - CM/SW Discharge Note   Patient Details  Name: Patrick [name] MRN: [id number] Date of Birth: [birth year]  Transition of Care [facility]) CM/SW Contact:  Zenon Mayo, RN Phone Number: 06/09/2021, 12:11 PM   Clinical Narrative:    Patient is for dc today, NCM notified Amy with [facility].     Barriers to Discharge: Continued Medical Work up   Patient Goals and CMS Choice Patient states their goals for this hospitalization and ongoing recovery are:: wants to go home CMS Medicare.gov Compare Post Acute Care list provided to:: Patient Choice offered to / list presented to : Patient  Discharge Placement                       Discharge Plan and Services In-house Referral: Clinical Social Work Discharge Planning Services: CM Consult Post Acute Care Choice: Home Health                    HH Arranged: RN          Social Determinants of Health (SDOH) Interventions Food Insecurity Interventions: Intervention Not Indicated Financial Strain Interventions: Intervention Not Indicated Housing Interventions: Intervention Not Indicated Transportation Interventions: SCAT [employer]in [employer])   Readmission Risk Interventions Readmission Risk Prevention Plan 08/05/2020  Transportation Screening Complete  PCP or Specialist Appt within 3-5 Days Complete  HRI or Home Care Consult Complete  Social Work Consult for [place] Planning/Counseling Complete  Palliative Care Screening Not Applicable  Medication Review [employer]) Complete  Some recent data might be hidden

## 2021-06-09 NOTE — Plan of Care (Signed)

## 2021-06-10 ENCOUNTER — Inpatient Hospital Stay (HOSPITAL_COMMUNITY): Payer: HMO

## 2021-06-10 HISTORY — PX: IR REMOVAL TUN CV CATH W/O FL: IMG2289

## 2021-06-10 NOTE — Progress Notes (Signed)
Transport here to p/u pt to take to [facility]

## 2021-06-10 NOTE — Plan of Care (Signed)
°  Problem: Education: Goal: Knowledge of General Education information will improve Description: Including pain rating scale, medication(s)/side effects and non-pharmacologic comfort measures 06/10/2021 0109 by Shanon Ace, RN Outcome: Adequate for Discharge 06/10/2021 0109 by Shanon Ace, RN Outcome: Progressing   Problem: Health Behavior/Discharge Planning: Goal: Ability to manage health-related needs will improve 06/10/2021 0109 by Shanon Ace, RN Outcome: Adequate for Discharge 06/10/2021 0109 by Shanon Ace, RN Outcome: Progressing   Problem: Clinical Measurements: Goal: Ability to maintain clinical measurements within normal limits will improve 06/10/2021 0109 by Shanon Ace, RN Outcome: Adequate for Discharge 06/10/2021 0109 by Shanon Ace, RN Outcome: Progressing Goal: Will remain free from infection 06/10/2021 0109 by Shanon Ace, RN Outcome: Adequate for Discharge 06/10/2021 0109 by Shanon Ace, RN Outcome: Progressing Goal: Diagnostic test results will improve 06/10/2021 0109 by Shanon Ace, RN Outcome: Adequate for Discharge 06/10/2021 0109 by Shanon Ace, RN Outcome: Progressing Goal: Respiratory complications will improve 06/10/2021 0109 by Shanon Ace, RN Outcome: Adequate for Discharge 06/10/2021 0109 by Shanon Ace, RN Outcome: Progressing Goal: Cardiovascular complication will be avoided 06/10/2021 0109 by Shanon Ace, RN Outcome: Adequate for Discharge 06/10/2021 0109 by Shanon Ace, RN Outcome: Progressing   Problem: Activity: Goal: Risk for activity intolerance will decrease 06/10/2021 0109 by Shanon Ace, RN Outcome: Adequate for Discharge 06/10/2021 0109 by Shanon Ace, RN Outcome: Progressing   Problem: Nutrition: Goal: Adequate nutrition will be maintained 06/10/2021 0109 by Shanon Ace, RN Outcome: Adequate for Discharge 06/10/2021 0109 by Shanon Ace, RN Outcome: Progressing   Problem:  Coping: Goal: Level of anxiety will decrease 06/10/2021 0109 by Shanon Ace, RN Outcome: Adequate for Discharge 06/10/2021 0109 by Shanon Ace, RN Outcome: Progressing   Problem: Elimination: Goal: Will not experience complications related to bowel motility 06/10/2021 0109 by Shanon Ace, RN Outcome: Adequate for Discharge 06/10/2021 0109 by Shanon Ace, RN Outcome: Progressing Goal: Will not experience complications related to urinary retention 06/10/2021 0109 by Shanon Ace, RN Outcome: Adequate for Discharge 06/10/2021 0109 by Shanon Ace, RN Outcome: Progressing   Problem: Pain Managment: Goal: General experience of comfort will improve 06/10/2021 0109 by Shanon Ace, RN Outcome: Adequate for Discharge 06/10/2021 0109 by Shanon Ace, RN Outcome: Progressing   Problem: Safety: Goal: Ability to remain free from injury will improve 06/10/2021 0109 by Shanon Ace, RN Outcome: Adequate for Discharge 06/10/2021 0109 by Shanon Ace, RN Outcome: Progressing   Problem: Skin Integrity: Goal: Risk for impaired skin integrity will decrease 06/10/2021 0109 by Shanon Ace, RN Outcome: Adequate for Discharge 06/10/2021 0109 by Shanon Ace, RN Outcome: Progressing

## 2021-06-10 NOTE — Procedures (Signed)
Pre procedural Dx: Malnutrition requiring TPN Post procedural Dx: Same  Successful removal of right chest tunneled central venous catheter place 05/23/21 in IR  EBL < 1 mL No immediate complications.  Vaseline gauze + 4x4 gauze + tegaderm applied -- dressing to remain x 24H then may remove and shower. If dressing becomes soiled may replace PRN.  Please see imaging section of Epic for full dictation.  Joaquim Nam PA-C 06/10/2021 11:58 AM

## 2021-06-10 NOTE — Progress Notes (Signed)
[name] Progress Note   Subjective: Was supposed to be discharged yesterday but here for HD. Needs central line removed prior to DC. Seen on HD. No C/Os.   Objective Vitals:   06/09/21 1528 06/10/21 0405 06/10/21 0729 06/10/21 0738  BP:  (!) 115/91 114/77 106/71  Pulse: 95 75 88 91  Resp: 19 14 13    Temp:  97.8 F (36.6 C) 98.7 F (37.1 C)   TempSrc:  Oral Oral   SpO2: 98% 97% 100%   Weight:   68.2 kg   Height:       Physical Exam General: Pleasant older male in NAD. He is legally blind Heart: irreg S1S2 No M/R/G. Afib on monitor. Rate controlled.  Lungs: CTAB Abdomen: Mid line drsg CDI. NT, NABS Extremities: No LE edema Dialysis Access: R AVF Cannulated    Additional Objective Labs: Basic Metabolic Panel: Recent Labs  Lab 06/05/21 0350 06/06/21 0719 06/07/21 1426 06/08/21 0435  NA 134* 130* 130* 129*  K 4.0 5.5* 4.2 4.4  CL 104 100 97* 97*  CO2 20* 18* 23 22  GLUCOSE 96 664* 104* 85  BUN 71* 89* 45* 49*  CREATININE 4.60* 5.23* 4.82* 4.73*  CALCIUM 8.6* 8.2* 8.3* 8.2*  PHOS 4.4 6.5*  --  4.8*   Liver Function Tests: Recent Labs  Lab 06/05/21 0350 06/06/21 0719 06/08/21 0435  AST 20  --   --   ALT 16  --   --   ALKPHOS 263*  --   --   BILITOT 1.5*  --   --   PROT 5.4*  --   --   ALBUMIN 1.7* 1.6* 1.7*   No results for input(s): LIPASE, AMYLASE in the last 168 hours. CBC: Recent Labs  Lab 06/04/21 0619 06/05/21 0350 06/06/21 0720 06/07/21 0900 06/08/21 0435  WBC 12.2* 12.4* 9.9 9.3 9.3  HGB 7.4* 7.1* 7.0* 7.6* 7.8*  HCT 21.7* 21.8* 20.5* 22.8* 23.3*  MCV 91.6 91.2 94.5 89.8 89.6  PLT 209 224 217 247 265   Blood Culture    Component Value Date/Time   SDES URINE, RANDOM 06/02/2018 1709   SPECREQUEST NONE 06/02/2018 1709   CULT  06/02/2018 1709    NO GROWTH Performed at [facility] [address], [place],  [address]    REPTSTATUS 06/04/2018 FINAL 06/02/2018 1709    Cardiac Enzymes: No results for  input(s): CKTOTAL, CKMB, CKMBINDEX, TROPONINI in the last 168 hours. CBG: Recent Labs  Lab 06/04/21 2018 06/04/21 2328 06/05/21 0348 06/05/21 0728 06/06/21 0914  GLUCAP 119* 118* 93 105* 94   Iron Studies: No results for input(s): IRON, TIBC, TRANSFERRIN, FERRITIN in the last 72 hours. @lablastinr3 @ Studies/Results: No results found. Medications:   acetaminophen  1,000 mg Oral Q6H   arformoterol  15 mcg Nebulization BID   budesonide (PULMICORT) nebulizer solution  0.25 mg Nebulization BID   carvedilol  3.125 mg Oral BID WC   Chlorhexidine Gluconate Cloth  6 each Topical Daily   darbepoetin (ARANESP) injection - DIALYSIS  200 mcg Intravenous Q Tue-HD   feeding supplement  1 Container Oral TID BM   mouth rinse  15 mL Mouth Rinse BID   methocarbamol  1,000 mg Oral QID   pantoprazole  40 mg Intravenous Q12H   revefenacin  175 mcg Nebulization Daily   sodium chloride flush  10-40 mL Intracatheter Q12H     OP HD: TTS AF   4h  400/500   65kg  2/2 bath  P2  AVF  Hep none - Sensipar 30mg  PO q HD - Venofer 50mg  IV weekly - Mircera 257mcg IV q 2 weeks (last 11/8)   Assessment/ Plan SBO/ sp ex lap w/ cholecystectomy and SB resection - on 12/07, done for gangrenous GB and chronic SB ischemia. Having BM's and advancing diet but reports poor appetite and some abdominal pain today.  Off of TPN and now taking solids po without N/V.  Off of abx and awaiting WBC and if rising repeat CT of abdomen.  Per primary service SP recent gallstone with CBD &pancreatic duct stenting UGIB - d/t AVM in stomach, rx'd by GI w/ APC/ endoclips/ epi sq on 12/12  ESRD: usual HD TTS. Continue with outpatient HD schedule.  HTN/volume: post HD edw 12/22 was 69kg, set OP EDW to 68 kg to allow for bed wts. UF as tolerated today.  Anemia CKD/ sickle cell/ abl-  Aranesp with HD weekly on Wed, last 12/20.  sp 10u prbc's total here. Transfuse prn Hypokalemia - resolved  Metabolic bone disease: Ca/Phos ok - resume  binders   Nutrition: on TPN, NG out, taking in PO  A-fib: per cardiology  Disposition - DC orders written, have sent orders to OP clinic. Needs central line removed prior to DC. Hopefully will go home today.   Tamberly Pomplun H. Lakitha Gordy NP-C 06/10/2021, 8:56 AM  [employer] [phone]

## 2021-06-10 NOTE — Progress Notes (Signed)
Discharge cancelled due to needing IR to remove central line. Central line removed on 12/24. He received HD on 12/24, can go home today.  Detail please see d/c summary done on 12/23.

## 2021-06-12 NOTE — TOC Transition Note (Signed)
Transition of care contact from inpatient facility  Date of discharge: 06/10/21 Date of contact: 06/12/21 Method: Phone Spoke to: Patient  Patient contacted to discuss transition of care from recent inpatient hospitalization. Patient was admitted to [facility] from 05/19/21-06/10/21 with discharge diagnosis of SBO and Gangrenous Cholecystitis. C/b post-op ileus and GIB.  Medication changes were reviewed. Denies needing any medication re-fills at this time.  Patient will follow up with his outpatient HD unit on: Tuesday 06/13/21 at [facility].  Tobie Poet, NP

## 2021-06-17 DIAGNOSIS — I158 Other secondary hypertension: Secondary | ICD-10-CM | POA: Diagnosis not present

## 2021-06-17 DIAGNOSIS — Z992 Dependence on renal dialysis: Secondary | ICD-10-CM | POA: Diagnosis not present

## 2021-06-17 DIAGNOSIS — N186 End stage renal disease: Secondary | ICD-10-CM | POA: Diagnosis not present

## 2021-06-20 DIAGNOSIS — Z992 Dependence on renal dialysis: Secondary | ICD-10-CM | POA: Diagnosis not present

## 2021-06-20 DIAGNOSIS — D631 Anemia in chronic kidney disease: Secondary | ICD-10-CM | POA: Diagnosis not present

## 2021-06-20 DIAGNOSIS — D509 Iron deficiency anemia, unspecified: Secondary | ICD-10-CM | POA: Diagnosis not present

## 2021-06-20 DIAGNOSIS — D57819 Other sickle-cell disorders with crisis, unspecified: Secondary | ICD-10-CM | POA: Diagnosis not present

## 2021-06-20 DIAGNOSIS — D57219 Sickle-cell/Hb-C disease with crisis, unspecified: Secondary | ICD-10-CM | POA: Diagnosis not present

## 2021-06-20 DIAGNOSIS — D571 Sickle-cell disease without crisis: Secondary | ICD-10-CM | POA: Diagnosis not present

## 2021-06-20 DIAGNOSIS — N186 End stage renal disease: Secondary | ICD-10-CM | POA: Diagnosis not present

## 2021-06-20 DIAGNOSIS — N2581 Secondary hyperparathyroidism of renal origin: Secondary | ICD-10-CM | POA: Diagnosis not present

## 2021-06-25 DIAGNOSIS — H548 Legal blindness, as defined in USA: Secondary | ICD-10-CM | POA: Diagnosis not present

## 2021-06-25 DIAGNOSIS — Z79891 Long term (current) use of opiate analgesic: Secondary | ICD-10-CM | POA: Diagnosis not present

## 2021-06-25 DIAGNOSIS — I5022 Chronic systolic (congestive) heart failure: Secondary | ICD-10-CM | POA: Diagnosis not present

## 2021-06-25 DIAGNOSIS — J449 Chronic obstructive pulmonary disease, unspecified: Secondary | ICD-10-CM | POA: Diagnosis not present

## 2021-06-25 DIAGNOSIS — Z4801 Encounter for change or removal of surgical wound dressing: Secondary | ICD-10-CM | POA: Diagnosis not present

## 2021-06-25 DIAGNOSIS — M109 Gout, unspecified: Secondary | ICD-10-CM | POA: Diagnosis not present

## 2021-06-25 DIAGNOSIS — I4821 Permanent atrial fibrillation: Secondary | ICD-10-CM | POA: Diagnosis not present

## 2021-06-25 DIAGNOSIS — K2100 Gastro-esophageal reflux disease with esophagitis, without bleeding: Secondary | ICD-10-CM | POA: Diagnosis not present

## 2021-06-25 DIAGNOSIS — D571 Sickle-cell disease without crisis: Secondary | ICD-10-CM | POA: Diagnosis not present

## 2021-06-25 DIAGNOSIS — Z992 Dependence on renal dialysis: Secondary | ICD-10-CM | POA: Diagnosis not present

## 2021-06-25 DIAGNOSIS — N186 End stage renal disease: Secondary | ICD-10-CM | POA: Diagnosis not present

## 2021-06-25 DIAGNOSIS — Z7901 Long term (current) use of anticoagulants: Secondary | ICD-10-CM | POA: Diagnosis not present

## 2021-06-25 DIAGNOSIS — D500 Iron deficiency anemia secondary to blood loss (chronic): Secondary | ICD-10-CM | POA: Diagnosis not present

## 2021-06-25 DIAGNOSIS — I272 Pulmonary hypertension, unspecified: Secondary | ICD-10-CM | POA: Diagnosis not present

## 2021-06-25 DIAGNOSIS — Z48815 Encounter for surgical aftercare following surgery on the digestive system: Secondary | ICD-10-CM | POA: Diagnosis not present

## 2021-06-25 DIAGNOSIS — Z87891 Personal history of nicotine dependence: Secondary | ICD-10-CM | POA: Diagnosis not present

## 2021-06-25 DIAGNOSIS — I132 Hypertensive heart and chronic kidney disease with heart failure and with stage 5 chronic kidney disease, or end stage renal disease: Secondary | ICD-10-CM | POA: Diagnosis not present

## 2021-06-27 ENCOUNTER — Ambulatory Visit: Payer: HMO | Admitting: Family Medicine

## 2021-06-27 ENCOUNTER — Inpatient Hospital Stay (HOSPITAL_COMMUNITY)
Admission: EM | Admit: 2021-06-27 | Discharge: 2021-06-30 | DRG: 377 | Discharge status: Against Medical Advice | Payer: HMO | Attending: Internal Medicine | Admitting: Internal Medicine

## 2021-06-27 ENCOUNTER — Emergency Department (HOSPITAL_COMMUNITY): Payer: HMO

## 2021-06-27 ENCOUNTER — Encounter (HOSPITAL_COMMUNITY): Payer: Self-pay

## 2021-06-27 DIAGNOSIS — Z66 Do not resuscitate: Secondary | ICD-10-CM | POA: Diagnosis not present

## 2021-06-27 DIAGNOSIS — I132 Hypertensive heart and chronic kidney disease with heart failure and with stage 5 chronic kidney disease, or end stage renal disease: Secondary | ICD-10-CM | POA: Diagnosis not present

## 2021-06-27 DIAGNOSIS — K922 Gastrointestinal hemorrhage, unspecified: Secondary | ICD-10-CM | POA: Diagnosis not present

## 2021-06-27 DIAGNOSIS — R42 Dizziness and giddiness: Secondary | ICD-10-CM | POA: Diagnosis not present

## 2021-06-27 DIAGNOSIS — I4821 Permanent atrial fibrillation: Secondary | ICD-10-CM | POA: Diagnosis present

## 2021-06-27 DIAGNOSIS — I10 Essential (primary) hypertension: Secondary | ICD-10-CM | POA: Diagnosis present

## 2021-06-27 DIAGNOSIS — I272 Pulmonary hypertension, unspecified: Secondary | ICD-10-CM | POA: Diagnosis not present

## 2021-06-27 DIAGNOSIS — K2100 Gastro-esophageal reflux disease with esophagitis, without bleeding: Secondary | ICD-10-CM | POA: Diagnosis not present

## 2021-06-27 DIAGNOSIS — B3781 Candidal esophagitis: Secondary | ICD-10-CM | POA: Diagnosis present

## 2021-06-27 DIAGNOSIS — D62 Acute posthemorrhagic anemia: Secondary | ICD-10-CM | POA: Diagnosis not present

## 2021-06-27 DIAGNOSIS — J449 Chronic obstructive pulmonary disease, unspecified: Secondary | ICD-10-CM | POA: Diagnosis present

## 2021-06-27 DIAGNOSIS — K2101 Gastro-esophageal reflux disease with esophagitis, with bleeding: Secondary | ICD-10-CM | POA: Diagnosis not present

## 2021-06-27 DIAGNOSIS — R1111 Vomiting without nausea: Secondary | ICD-10-CM | POA: Diagnosis not present

## 2021-06-27 DIAGNOSIS — N186 End stage renal disease: Secondary | ICD-10-CM | POA: Diagnosis present

## 2021-06-27 DIAGNOSIS — E871 Hypo-osmolality and hyponatremia: Secondary | ICD-10-CM | POA: Diagnosis present

## 2021-06-27 DIAGNOSIS — Z48815 Encounter for surgical aftercare following surgery on the digestive system: Secondary | ICD-10-CM | POA: Diagnosis not present

## 2021-06-27 DIAGNOSIS — D649 Anemia, unspecified: Secondary | ICD-10-CM

## 2021-06-27 DIAGNOSIS — Z79899 Other long term (current) drug therapy: Secondary | ICD-10-CM | POA: Diagnosis not present

## 2021-06-27 DIAGNOSIS — I5040 Unspecified combined systolic (congestive) and diastolic (congestive) heart failure: Secondary | ICD-10-CM | POA: Diagnosis not present

## 2021-06-27 DIAGNOSIS — D500 Iron deficiency anemia secondary to blood loss (chronic): Secondary | ICD-10-CM | POA: Diagnosis not present

## 2021-06-27 DIAGNOSIS — Z8249 Family history of ischemic heart disease and other diseases of the circulatory system: Secondary | ICD-10-CM | POA: Diagnosis not present

## 2021-06-27 DIAGNOSIS — K2901 Acute gastritis with bleeding: Secondary | ICD-10-CM | POA: Diagnosis not present

## 2021-06-27 DIAGNOSIS — Z9049 Acquired absence of other specified parts of digestive tract: Secondary | ICD-10-CM | POA: Diagnosis not present

## 2021-06-27 DIAGNOSIS — I5022 Chronic systolic (congestive) heart failure: Secondary | ICD-10-CM | POA: Diagnosis present

## 2021-06-27 DIAGNOSIS — N2581 Secondary hyperparathyroidism of renal origin: Secondary | ICD-10-CM | POA: Diagnosis not present

## 2021-06-27 DIAGNOSIS — Z992 Dependence on renal dialysis: Secondary | ICD-10-CM | POA: Diagnosis not present

## 2021-06-27 DIAGNOSIS — Z7901 Long term (current) use of anticoagulants: Secondary | ICD-10-CM

## 2021-06-27 DIAGNOSIS — K319 Disease of stomach and duodenum, unspecified: Secondary | ICD-10-CM | POA: Diagnosis not present

## 2021-06-27 DIAGNOSIS — Z87891 Personal history of nicotine dependence: Secondary | ICD-10-CM | POA: Diagnosis not present

## 2021-06-27 DIAGNOSIS — I953 Hypotension of hemodialysis: Secondary | ICD-10-CM | POA: Diagnosis present

## 2021-06-27 DIAGNOSIS — D631 Anemia in chronic kidney disease: Secondary | ICD-10-CM | POA: Diagnosis not present

## 2021-06-27 DIAGNOSIS — Z4801 Encounter for change or removal of surgical wound dressing: Secondary | ICD-10-CM | POA: Diagnosis not present

## 2021-06-27 DIAGNOSIS — Z9889 Other specified postprocedural states: Secondary | ICD-10-CM

## 2021-06-27 DIAGNOSIS — K222 Esophageal obstruction: Secondary | ICD-10-CM | POA: Diagnosis present

## 2021-06-27 DIAGNOSIS — I120 Hypertensive chronic kidney disease with stage 5 chronic kidney disease or end stage renal disease: Secondary | ICD-10-CM | POA: Diagnosis not present

## 2021-06-27 DIAGNOSIS — Z8719 Personal history of other diseases of the digestive system: Secondary | ICD-10-CM | POA: Diagnosis present

## 2021-06-27 DIAGNOSIS — K449 Diaphragmatic hernia without obstruction or gangrene: Secondary | ICD-10-CM | POA: Diagnosis not present

## 2021-06-27 DIAGNOSIS — R531 Weakness: Secondary | ICD-10-CM | POA: Diagnosis not present

## 2021-06-27 DIAGNOSIS — I700 Atherosclerosis of aorta: Secondary | ICD-10-CM | POA: Diagnosis not present

## 2021-06-27 DIAGNOSIS — I5042 Chronic combined systolic (congestive) and diastolic (congestive) heart failure: Secondary | ICD-10-CM | POA: Diagnosis present

## 2021-06-27 DIAGNOSIS — K2971 Gastritis, unspecified, with bleeding: Principal | ICD-10-CM | POA: Diagnosis present

## 2021-06-27 DIAGNOSIS — Z20822 Contact with and (suspected) exposure to covid-19: Secondary | ICD-10-CM | POA: Diagnosis present

## 2021-06-27 DIAGNOSIS — H547 Unspecified visual loss: Secondary | ICD-10-CM

## 2021-06-27 DIAGNOSIS — Z79891 Long term (current) use of opiate analgesic: Secondary | ICD-10-CM | POA: Diagnosis not present

## 2021-06-27 DIAGNOSIS — H548 Legal blindness, as defined in USA: Secondary | ICD-10-CM | POA: Diagnosis not present

## 2021-06-27 DIAGNOSIS — M109 Gout, unspecified: Secondary | ICD-10-CM | POA: Diagnosis present

## 2021-06-27 DIAGNOSIS — K259 Gastric ulcer, unspecified as acute or chronic, without hemorrhage or perforation: Secondary | ICD-10-CM | POA: Diagnosis not present

## 2021-06-27 DIAGNOSIS — I959 Hypotension, unspecified: Secondary | ICD-10-CM | POA: Diagnosis not present

## 2021-06-27 DIAGNOSIS — D571 Sickle-cell disease without crisis: Secondary | ICD-10-CM | POA: Diagnosis present

## 2021-06-27 DIAGNOSIS — R9431 Abnormal electrocardiogram [ECG] [EKG]: Secondary | ICD-10-CM | POA: Diagnosis present

## 2021-06-27 DIAGNOSIS — N250 Renal osteodystrophy: Secondary | ICD-10-CM | POA: Diagnosis not present

## 2021-06-27 DIAGNOSIS — Z9115 Patient's noncompliance with renal dialysis: Secondary | ICD-10-CM | POA: Diagnosis not present

## 2021-06-27 DIAGNOSIS — R1084 Generalized abdominal pain: Secondary | ICD-10-CM | POA: Diagnosis not present

## 2021-06-27 DIAGNOSIS — R109 Unspecified abdominal pain: Secondary | ICD-10-CM | POA: Diagnosis not present

## 2021-06-27 LAB — CBC WITH DIFFERENTIAL/PLATELET
Abs Immature Granulocytes: 0.32 10*3/uL — ABNORMAL HIGH (ref 0.00–0.07)
Basophils Absolute: 0 10*3/uL (ref 0.0–0.1)
Basophils Relative: 0 %
Eosinophils Absolute: 0.1 10*3/uL (ref 0.0–0.5)
Eosinophils Relative: 0 %
HCT: 13.6 % — ABNORMAL LOW (ref 39.0–52.0)
Hemoglobin: 4.7 g/dL — CL (ref 13.0–17.0)
Immature Granulocytes: 2 %
Lymphocytes Relative: 3 %
Lymphs Abs: 0.5 10*3/uL — ABNORMAL LOW (ref 0.7–4.0)
MCH: 29.9 pg (ref 26.0–34.0)
MCHC: 34.6 g/dL (ref 30.0–36.0)
MCV: 86.6 fL (ref 80.0–100.0)
Monocytes Absolute: 1.6 10*3/uL — ABNORMAL HIGH (ref 0.1–1.0)
Monocytes Relative: 12 %
Neutro Abs: 11.5 10*3/uL — ABNORMAL HIGH (ref 1.7–7.7)
Neutrophils Relative %: 83 %
Platelets: 254 10*3/uL (ref 150–400)
RBC: 1.57 MIL/uL — ABNORMAL LOW (ref 4.22–5.81)
RDW: 21.8 % — ABNORMAL HIGH (ref 11.5–15.5)
WBC: 14 10*3/uL — ABNORMAL HIGH (ref 4.0–10.5)
nRBC: 4.6 % — ABNORMAL HIGH (ref 0.0–0.2)

## 2021-06-27 LAB — COMPREHENSIVE METABOLIC PANEL
ALT: 14 U/L (ref 0–44)
AST: 23 U/L (ref 15–41)
Albumin: <1.5 g/dL — ABNORMAL LOW (ref 3.5–5.0)
Alkaline Phosphatase: 563 U/L — ABNORMAL HIGH (ref 38–126)
Anion gap: 15 (ref 5–15)
BUN: 41 mg/dL — ABNORMAL HIGH (ref 8–23)
CO2: 28 mmol/L (ref 22–32)
Calcium: 8.1 mg/dL — ABNORMAL LOW (ref 8.9–10.3)
Chloride: 91 mmol/L — ABNORMAL LOW (ref 98–111)
Creatinine, Ser: 5.65 mg/dL — ABNORMAL HIGH (ref 0.61–1.24)
GFR, Estimated: 11 mL/min — ABNORMAL LOW (ref >60)
Glucose, Bld: 85 mg/dL (ref 70–99)
Potassium: 3.6 mmol/L (ref 3.5–5.1)
Sodium: 134 mmol/L — ABNORMAL LOW (ref 135–145)
Total Bilirubin: 3 mg/dL — ABNORMAL HIGH (ref 0.3–1.2)
Total Protein: 6.1 g/dL — ABNORMAL LOW (ref 6.5–8.1)

## 2021-06-27 LAB — RESP PANEL BY RT-PCR (FLU A&B, COVID) ARPGX2
Influenza A by PCR: NEGATIVE
Influenza B by PCR: NEGATIVE
SARS Coronavirus 2 by RT PCR: NEGATIVE

## 2021-06-27 LAB — POC OCCULT BLOOD, ED: Fecal Occult Bld: POSITIVE — AB

## 2021-06-27 LAB — LIPASE, BLOOD: Lipase: 30 U/L (ref 11–51)

## 2021-06-27 LAB — PROTIME-INR
INR: 1.9 — ABNORMAL HIGH (ref 0.8–1.2)
Prothrombin Time: 21.4 seconds — ABNORMAL HIGH (ref 11.4–15.2)

## 2021-06-27 LAB — PREPARE RBC (CROSSMATCH)

## 2021-06-27 LAB — TROPONIN I (HIGH SENSITIVITY)
Troponin I (High Sensitivity): 35 ng/L — ABNORMAL HIGH (ref <18)
Troponin I (High Sensitivity): 29 ng/L — ABNORMAL HIGH (ref <18)

## 2021-06-27 LAB — LACTIC ACID, PLASMA: Lactic Acid, Venous: 1.3 mmol/L (ref 0.5–1.9)

## 2021-06-27 MED ORDER — HYDRALAZINE HCL 20 MG/ML IJ SOLN: 5.0000 mg | INTRAMUSCULAR | Status: DC | PRN

## 2021-06-27 MED ORDER — PANTOPRAZOLE 80MG IVPB - SIMPLE MED
80.0000 mg | Freq: Once | INTRAVENOUS | Status: AC
  Administered 2021-06-27: 80 mg via INTRAVENOUS
  Filled 2021-06-27: qty 80

## 2021-06-27 MED ORDER — CHLORHEXIDINE GLUCONATE CLOTH 2 % EX PADS
6.0000 | MEDICATED_PAD | Freq: Every day | CUTANEOUS | Status: DC
  Administered 2021-06-28: 6 via TOPICAL

## 2021-06-27 MED ORDER — SORBITOL 70 % SOLN
30.0000 mL | Status: DC | PRN
  Filled 2021-06-27: qty 30

## 2021-06-27 MED ORDER — ZOLPIDEM TARTRATE 5 MG PO TABS: 5.0000 mg | ORAL_TABLET | Freq: Every evening | ORAL | Status: DC | PRN

## 2021-06-27 MED ORDER — CAMPHOR-MENTHOL 0.5-0.5 % EX LOTN
1.0000 "application " | TOPICAL_LOTION | Freq: Three times a day (TID) | CUTANEOUS | Status: DC | PRN
  Filled 2021-06-27: qty 222

## 2021-06-27 MED ORDER — PANTOPRAZOLE SODIUM 40 MG IV SOLR
40.0000 mg | Freq: Two times a day (BID) | INTRAVENOUS | Status: DC
  Administered 2021-06-27 – 2021-06-28 (×2): 40 mg via INTRAVENOUS
  Filled 2021-06-27 (×2): qty 40

## 2021-06-27 MED ORDER — GABAPENTIN 100 MG PO CAPS: 100.0000 mg | ORAL_CAPSULE | Freq: Every evening | ORAL | Status: DC | PRN

## 2021-06-27 MED ORDER — NEPRO/CARBSTEADY PO LIQD
237.0000 mL | Freq: Three times a day (TID) | ORAL | Status: DC | PRN
  Filled 2021-06-27: qty 237

## 2021-06-27 MED ORDER — LACTATED RINGERS IV SOLN: INTRAVENOUS | Status: DC

## 2021-06-27 MED ORDER — SODIUM CHLORIDE 0.9% FLUSH
3.0000 mL | Freq: Two times a day (BID) | INTRAVENOUS | Status: DC
  Administered 2021-06-27 – 2021-06-29 (×5): 3 mL via INTRAVENOUS

## 2021-06-27 MED ORDER — SODIUM CHLORIDE 0.9 % IV SOLN
125.0000 mg | INTRAVENOUS | Status: DC
  Filled 2021-06-27 (×3): qty 10

## 2021-06-27 MED ORDER — SODIUM CHLORIDE 0.9 % IV BOLUS
500.0000 mL | Freq: Once | INTRAVENOUS | Status: AC
Start: 2021-06-27 — End: 2021-06-27
  Administered 2021-06-27: 500 mL via INTRAVENOUS

## 2021-06-27 MED ORDER — IOHEXOL 300 MG/ML  SOLN
80.0000 mL | Freq: Once | INTRAMUSCULAR | Status: AC | PRN
  Administered 2021-06-27: 80 mL via INTRAVENOUS

## 2021-06-27 MED ORDER — ALLOPURINOL 100 MG PO TABS
100.0000 mg | ORAL_TABLET | Freq: Every day | ORAL | Status: DC
  Administered 2021-06-28 – 2021-06-29 (×2): 100 mg via ORAL
  Filled 2021-06-27 (×2): qty 1

## 2021-06-27 MED ORDER — DOXERCALCIFEROL 4 MCG/2ML IV SOLN
9.0000 ug | INTRAVENOUS | Status: DC
  Administered 2021-06-29: 9 ug via INTRAVENOUS
  Filled 2021-06-27 (×3): qty 6

## 2021-06-27 MED ORDER — CINACALCET HCL 30 MG PO TABS
30.0000 mg | ORAL_TABLET | ORAL | Status: DC
  Administered 2021-06-29: 30 mg via ORAL
  Filled 2021-06-27 (×3): qty 1

## 2021-06-27 MED ORDER — OXYCODONE HCL 5 MG PO TABS
5.0000 mg | ORAL_TABLET | Freq: Four times a day (QID) | ORAL | Status: DC | PRN
  Administered 2021-06-27: 10 mg via ORAL
  Administered 2021-06-28 (×2): 5 mg via ORAL
  Administered 2021-06-29: 10 mg via ORAL
  Filled 2021-06-27: qty 2
  Filled 2021-06-27 (×4): qty 1

## 2021-06-27 MED ORDER — ACETAMINOPHEN 325 MG PO TABS
650.0000 mg | ORAL_TABLET | Freq: Four times a day (QID) | ORAL | Status: DC | PRN
  Administered 2021-06-28: 650 mg via ORAL
  Filled 2021-06-27: qty 2

## 2021-06-27 MED ORDER — DOCUSATE SODIUM 283 MG RE ENEM
1.0000 | ENEMA | RECTAL | Status: DC | PRN
  Filled 2021-06-27: qty 1

## 2021-06-27 MED ORDER — HYDROXYZINE HCL 25 MG PO TABS: 25.0000 mg | ORAL_TABLET | Freq: Three times a day (TID) | ORAL | Status: DC | PRN

## 2021-06-27 MED ORDER — SODIUM CHLORIDE 0.9 % IV SOLN: 10.0000 mL/h | Freq: Once | INTRAVENOUS | Status: DC

## 2021-06-27 MED ORDER — FERRIC CITRATE 1 GM 210 MG(FE) PO TABS
420.0000 mg | ORAL_TABLET | Freq: Three times a day (TID) | ORAL | Status: DC
Start: 2021-06-27 — End: 2021-06-30
  Administered 2021-06-27 – 2021-06-29 (×5): 420 mg via ORAL
  Filled 2021-06-27 (×10): qty 2

## 2021-06-27 MED ORDER — HYDROXYUREA 500 MG PO CAPS
500.0000 mg | ORAL_CAPSULE | Freq: Every day | ORAL | Status: DC
  Administered 2021-06-29: 500 mg via ORAL
  Filled 2021-06-27: qty 1

## 2021-06-27 MED ORDER — CALCIUM CARBONATE ANTACID 1250 MG/5ML PO SUSP
500.0000 mg | Freq: Four times a day (QID) | ORAL | Status: DC | PRN
  Administered 2021-06-27: 500 mg via ORAL
  Filled 2021-06-27 (×2): qty 5

## 2021-06-27 MED ORDER — ACETAMINOPHEN 650 MG RE SUPP: 650.0000 mg | Freq: Four times a day (QID) | RECTAL | Status: DC | PRN

## 2021-06-27 NOTE — H&P (Signed)
[facility] Renal Consultation Note    Indication for Consultation:  Management of ESRD/hemodialysis; anemia, hypertension/volume and secondary hyperparathyroidism PCP:  HPI: Patrick [name] is a 66 y.o. male with ESRD on hemodialysis T,Th,S at [facility]. PMH: Combined systolic/diastolic HF, persistent AFIB, sickle cell disease, gout, legally blind. Recent admission 12/02-12/23/2022 with ischemic small intestine S/P SBO 05/24/2021.   Patient presented to HD unit this AM with C/O weakness, noted to be hypotensive. He was sent to ED for evaluation. HGB 4.7 on arrival. FOBT +/ He has been seen by GI and admitted as observation patient for upper GIB. He is being tranfused. He is current too unstable for HD. K+ 3.6. No evidence of volume overload by CXR. He will have HD tomorrow off schedule.   Past Medical History:  Diagnosis Date   Blood dyscrasia    Blood transfusion    "I've had a bunch of them"   CHF (congestive heart failure) ([place])    followed by hf clinic   Chronic kidney disease (CKD), stage III (moderate) ([place])    Dialysis patient ([place]) 04/2019   AV fistula (right arm)   Gout    Headache(784.0)    "probably weekly" (01/13/2014)   Legally blind    Shortness of breath dyspnea    Sickle cell disease, type SS (HCC)    Swelling abdomen    Swelling of extremity, left    Swelling of extremity, right    Past Surgical History:  Procedure Laterality Date   A/V FISTULAGRAM Right 07/29/2020   Procedure: A/V FISTULAGRAM - Right Arm;  Surgeon: Angelia Mould, MD;  Location: [place] CV LAB;  Service: Cardiovascular;  Laterality: Right;   BASCILIC VEIN TRANSPOSITION Right 04/12/2014   Procedure: BASILIC VEIN TRANSPOSITION;  Surgeon: Elam Dutch, MD;  Location: [place];  Service: Vascular;  Laterality: Right;   BIOPSY  08/04/2020   Procedure: BIOPSY;  Surgeon: Ladene Artist, MD;  Location: [facility] ENDOSCOPY;  Service: Gastroenterology;;    BOWEL RESECTION N/A 05/24/2021   Procedure: SMALL BOWEL RESECTION;  Surgeon: Rolm Bookbinder, MD;  Location: [place];  Service: General;  Laterality: N/A;   CARDIAC CATHETERIZATION  05/2011   CARDIOVERSION N/A 06/05/2018   Procedure: CARDIOVERSION;  Surgeon: Jolaine Artist, MD;  Location: [place];  Service: Cardiovascular;  Laterality: N/A;   CARDIOVERSION N/A 10/06/2019   Procedure: CARDIOVERSION;  Surgeon: Jolaine Artist, MD;  Location: [facility] ENDOSCOPY;  Service: Cardiovascular;  Laterality: N/A;   CHOLECYSTECTOMY N/A 05/24/2021   Procedure: OPEN CHOLECYSTECTOMY;  Surgeon: Rolm Bookbinder, MD;  Location: [place];  Service: General;  Laterality: N/A;   COLONOSCOPY WITH PROPOFOL N/A 09/24/2017   Procedure: COLONOSCOPY WITH PROPOFOL;  Surgeon: Jackquline Denmark, MD;  Location: [facility] ENDOSCOPY;  Service: Endoscopy;  Laterality: N/A;   ESOPHAGOGASTRODUODENOSCOPY N/A 09/19/2017   Procedure: ESOPHAGOGASTRODUODENOSCOPY (EGD);  Surgeon: Jackquline Denmark, MD;  Location: [facility] ENDOSCOPY;  Service: Endoscopy;  Laterality: N/A;   ESOPHAGOGASTRODUODENOSCOPY N/A 05/29/2021   Procedure: ESOPHAGOGASTRODUODENOSCOPY (EGD);  Surgeon: Milus Banister, MD;  Location: [facility] ENDOSCOPY;  Service: Gastroenterology;  Laterality: N/A;   ESOPHAGOGASTRODUODENOSCOPY (EGD) WITH PROPOFOL N/A 08/04/2020   Procedure: ESOPHAGOGASTRODUODENOSCOPY (EGD) WITH PROPOFOL;  Surgeon: Ladene Artist, MD;  Location: [place];  Service: Gastroenterology;  Laterality: N/A;   HEMOSTASIS CLIP PLACEMENT  05/29/2021   Procedure: HEMOSTASIS CLIP PLACEMENT;  Surgeon: Milus Banister, MD;  Location: [facility] ENDOSCOPY;  Service: Gastroenterology;;   HOT HEMOSTASIS N/A 05/29/2021   Procedure: HOT HEMOSTASIS (ARGON PLASMA  COAGULATION/BICAP);  Surgeon: Milus Banister, MD;  Location: [facility] ENDOSCOPY;  Service: Gastroenterology;  Laterality: N/A;   IR PERC TUN PERIT CATH WO PORT S&I /IMAG  05/23/2021   IR REMOVAL TUN CV CATH W/O FL  06/10/2021   IR US GUIDE  VASC ACCESS RIGHT  05/23/2021   LAPAROSCOPY N/A 05/24/2021   Procedure: LAPAROSCOPY DIAGNOSTIC;  Surgeon: Rolm Bookbinder, MD;  Location: [place];  Service: General;  Laterality: N/A;   LAPAROTOMY N/A 05/24/2021   Procedure: EXPLORATORY LAPAROTOMY;  Surgeon: Rolm Bookbinder, MD;  Location: [place];  Service: General;  Laterality: N/A;   LEFT AND RIGHT HEART CATHETERIZATION WITH CORONARY ANGIOGRAM N/A 06/11/2011   Procedure: LEFT AND RIGHT HEART CATHETERIZATION WITH CORONARY ANGIOGRAM;  Surgeon: Larey Dresser, MD;  Location: [facility] CATH LAB;  Service: Cardiovascular;  Laterality: N/A;   PERIPHERAL VASCULAR BALLOON ANGIOPLASTY Right 07/29/2020   Procedure: PERIPHERAL VASCULAR BALLOON ANGIOPLASTY;  Surgeon: Angelia Mould, MD;  Location: [place] CV LAB;  Service: Cardiovascular;  Laterality: Right;  arm fistula   REVISON OF ARTERIOVENOUS FISTULA Right 08/08/2020   Procedure: ANEURYSM EXCISION OF RIGHT UPPER EXTREMITY ARTERIOVENOUS FISTULA;  Surgeon: Angelia Mould, MD;  Location: [facility] OR;  Service: Vascular;  Laterality: Right;   SCLEROTHERAPY  05/29/2021   Procedure: Clide Deutscher;  Surgeon: Milus Banister, MD;  Location: [facility] ENDOSCOPY;  Service: Gastroenterology;;   Lavell Islam REMOVAL  05/29/2021   Procedure: STENT REMOVAL;  Surgeon: Milus Banister, MD;  Location: [facility] ENDOSCOPY;  Service: Gastroenterology;;   TEE WITHOUT CARDIOVERSION N/A 12/27/2015   Procedure: TRANSESOPHAGEAL ECHOCARDIOGRAM (TEE);  Surgeon: Sueanne Margarita, MD;  Location: [facility] ENDOSCOPY;  Service: Cardiovascular;  Laterality: N/A;   Family History  Problem Relation Age of Onset   Alcohol abuse Mother    Liver disease Mother    Cirrhosis Mother    Heart attack Mother    Social History:  reports that he quit smoking about 5 years ago. His smoking use included cigarettes. He has a 20.50 pack-year smoking history. He has never used smokeless tobacco. He reports that he does not currently use alcohol after a past usage of  about 3.0 standard drinks per week. He reports that he does not use drugs. No Known Allergies Prior to Admission medications   Medication Sig Start Date End Date Taking? Authorizing Provider  acetaminophen (TYLENOL) 500 MG tablet Take 1,000 mg by mouth every 6 (six) hours as needed for moderate pain or headache.   Yes [provider]  albuterol (VENTOLIN HFA) 108 (90 Base) MCG/ACT inhaler Inhale 2 puffs into the lungs every 6 (six) hours as needed for wheezing or shortness of breath. 05/09/20  Yes Freddi Starr, MD  allopurinol (ZYLOPRIM) 100 MG tablet TAKE 1 TABLET BY MOUTH EVERY DAY 06/09/21  Yes Jegede, Olugbemiga E, MD  AURYXIA 1 GM 210 MG(Fe) tablet Take 420 mg by mouth 3 (three) times daily. 02/11/21  Yes [provider]  carvedilol (COREG) 3.125 MG tablet Take 1 tablet (3.125 mg total) by mouth 2 (two) times daily with a meal. 06/09/21  Yes Florencia Reasons, MD  diphenhydramine-acetaminophen (TYLENOL PM) 25-500 MG TABS tablet Take 2 tablets by mouth at bedtime as needed (sleep).   Yes [provider]  ELIQUIS 5 MG TABS tablet TAKE 1 TABLET BY MOUTH TWICE A DAY Patient taking differently: Take 5 mg by mouth 2 (two) times daily. 02/21/21  Yes Bensimhon, Shaune Pascal, MD  folic acid (FOLVITE) 1 MG tablet TAKE 1 TABLET BY MOUTH  EVERY DAY Patient taking differently: Take 1 mg by mouth daily. 02/22/21  Yes Dorena Dew, FNP  gabapentin (NEURONTIN) 100 MG capsule TAKE 1 CAPSULE BY MOUTH EVERYDAY AT BEDTIME Patient taking differently: 100 mg at bedtime as needed (pain). 12/12/20  Yes Dorena Dew, FNP  hydroxyurea (HYDREA) 500 MG capsule Take 1 capsule (500 mg total) by mouth daily. TAKE 1 CAPSULE BY MOUTH EVERY DAY WITH FOOD TO MINIMIZE GI SIDE EFFECTS 01/31/21  Yes Dorena Dew, FNP  lidocaine-prilocaine (EMLA) cream Apply 1 application topically daily as needed (port access).  09/08/19  Yes [provider]  oxycodone (OXY-IR) 5 MG capsule Take 5-10 mg by mouth  every 6 (six) hours as needed for pain.   Yes [provider]  pantoprazole (PROTONIX) 40 MG tablet Take 1 tablet (40 mg total) by mouth 2 (two) times daily. 06/09/21 06/09/22 Yes Florencia Reasons, MD  tiZANidine (ZANAFLEX) 4 MG tablet TAKE 0.5 TABLET BY MOUTH EVERY 8 HOURS AS NEEDED FOR MUSCLE SPASMS Patient taking differently: Take 2 mg by mouth every 8 (eight) hours as needed for muscle spasms. 02/22/21  Yes Dorena Dew, FNP  torsemide (DEMADEX) 20 MG tablet Take 80 mg by mouth daily.   Yes [provider]  Vitamin D, Ergocalciferol, (DRISDOL) 1.25 MG (50000 UNIT) CAPS capsule TAKE 1 CAPSULE BY MOUTH ONE TIME PER WEEK Patient taking differently: Take 50,000 Units by mouth once a week. Monday 05/15/21  Yes Dorena Dew, FNP   Current Facility-Administered Medications  Medication Dose Route Frequency Provider Last Rate Last Admin   0.9 %  sodium chloride infusion  250 mL Intravenous PRN Elam Dutch, MD       0.9 %  sodium chloride infusion  10 mL/hr Intravenous Once Karmen Bongo, MD   Held at 06/27/21 [phone]   acetaminophen (TYLENOL) tablet 650 mg  650 mg Oral Q6H PRN Karmen Bongo, MD       Or   acetaminophen (TYLENOL) suppository 650 mg  650 mg Rectal Q6H PRN Karmen Bongo, MD       [START ON 06/28/2021] allopurinol (ZYLOPRIM) tablet 100 mg  100 mg Oral Daily Karmen Bongo, MD       calcium carbonate (dosed in mg elemental calcium) suspension 500 mg of elemental calcium  500 mg of elemental calcium Oral Q6H PRN Karmen Bongo, MD   500 mg of elemental calcium at 06/27/21 1329   camphor-menthol (SARNA) lotion 1 application  1 application Topical Z5G PRN Karmen Bongo, MD       And   hydrOXYzine (ATARAX) tablet 25 mg  25 mg Oral Q8H PRN Karmen Bongo, MD       docusate sodium (ENEMEEZ) enema 283 mg  1 enema Rectal PRN Karmen Bongo, MD       feeding supplement (NEPRO CARB STEADY) liquid 237 mL  237 mL Oral TID PRN Karmen Bongo, MD       ferric citrate  (AURYXIA) tablet 420 mg  420 mg Oral TID Karmen Bongo, MD       gabapentin (NEURONTIN) capsule 100 mg  100 mg Oral QHS PRN Karmen Bongo, MD       hydrALAZINE (APRESOLINE) injection 5 mg  5 mg Intravenous Q4H PRN Karmen Bongo, MD       hydroxyurea (HYDREA) capsule 500 mg  500 mg Oral Daily Karmen Bongo, MD       lactated ringers infusion   Intravenous Continuous Karmen Bongo, MD   Held at 06/27/21 [phone]  oxyCODONE (Oxy IR/ROXICODONE) immediate release tablet 5-10 mg  5-10 mg Oral Q6H PRN Karmen Bongo, MD       pantoprazole (PROTONIX) injection 40 mg  40 mg Intravenous Q12H Gribbin, Sarah J, PA-C       sodium chloride flush (NS) 0.9 % injection 3 mL  3 mL Intravenous Q12H Fields, Jessy Oto, MD       sodium chloride flush (NS) 0.9 % injection 3 mL  3 mL Intravenous PRN Elam Dutch, MD       sodium chloride flush (NS) 0.9 % injection 3 mL  3 mL Intravenous Q12H Karmen Bongo, MD       sorbitol 70 % solution 30 mL  30 mL Oral PRN Karmen Bongo, MD       zolpidem Lorrin Mais) tablet 5 mg  5 mg Oral QHS PRN Karmen Bongo, MD       Current Outpatient Medications  Medication Sig Dispense Refill   acetaminophen (TYLENOL) 500 MG tablet Take 1,000 mg by mouth every 6 (six) hours as needed for moderate pain or headache.     albuterol (VENTOLIN HFA) 108 (90 Base) MCG/ACT inhaler Inhale 2 puffs into the lungs every 6 (six) hours as needed for wheezing or shortness of breath. 8 g 6   allopurinol (ZYLOPRIM) 100 MG tablet TAKE 1 TABLET BY MOUTH EVERY DAY 30 tablet 1   AURYXIA 1 GM 210 MG(Fe) tablet Take 420 mg by mouth 3 (three) times daily.     carvedilol (COREG) 3.125 MG tablet Take 1 tablet (3.125 mg total) by mouth 2 (two) times daily with a meal. 60 tablet 0   diphenhydramine-acetaminophen (TYLENOL PM) 25-500 MG TABS tablet Take 2 tablets by mouth at bedtime as needed (sleep).     ELIQUIS 5 MG TABS tablet TAKE 1 TABLET BY MOUTH TWICE A DAY (Patient taking differently: Take 5 mg by  mouth 2 (two) times daily.) 60 tablet 2   folic acid (FOLVITE) 1 MG tablet TAKE 1 TABLET BY MOUTH EVERY DAY (Patient taking differently: Take 1 mg by mouth daily.) 90 tablet 3   gabapentin (NEURONTIN) 100 MG capsule TAKE 1 CAPSULE BY MOUTH EVERYDAY AT BEDTIME (Patient taking differently: 100 mg at bedtime as needed (pain).) 30 capsule 3   hydroxyurea (HYDREA) 500 MG capsule Take 1 capsule (500 mg total) by mouth daily. TAKE 1 CAPSULE BY MOUTH EVERY DAY WITH FOOD TO MINIMIZE GI SIDE EFFECTS 90 capsule 1   lidocaine-prilocaine (EMLA) cream Apply 1 application topically daily as needed (port access).      oxycodone (OXY-IR) 5 MG capsule Take 5-10 mg by mouth every 6 (six) hours as needed for pain.     pantoprazole (PROTONIX) 40 MG tablet Take 1 tablet (40 mg total) by mouth 2 (two) times daily. 30 tablet 0   tiZANidine (ZANAFLEX) 4 MG tablet TAKE 0.5 TABLET BY MOUTH EVERY 8 HOURS AS NEEDED FOR MUSCLE SPASMS (Patient taking differently: Take 2 mg by mouth every 8 (eight) hours as needed for muscle spasms.) 30 tablet 1   torsemide (DEMADEX) 20 MG tablet Take 80 mg by mouth daily.     Vitamin D, Ergocalciferol, (DRISDOL) 1.25 MG (50000 UNIT) CAPS capsule TAKE 1 CAPSULE BY MOUTH ONE TIME PER WEEK (Patient taking differently: Take 50,000 Units by mouth once a week. Monday) 12 capsule 1   Labs: Basic Metabolic Panel: Recent Labs  Lab 06/27/21 0830  NA 134*  K 3.6  CL 91*  CO2 28  GLUCOSE 85  BUN  41*  CREATININE 5.65*  CALCIUM 8.1*   Liver Function Tests: Recent Labs  Lab 06/27/21 0830  AST 23  ALT 14  ALKPHOS 563*  BILITOT 3.0*  PROT 6.1*  ALBUMIN <1.5*   Recent Labs  Lab 06/27/21 0830  LIPASE 30   No results for input(s): AMMONIA in the last 168 hours. CBC: Recent Labs  Lab 06/27/21 0830  WBC 14.0*  NEUTROABS 11.5*  HGB 4.7*  HCT 13.6*  MCV 86.6  PLT 254   Cardiac Enzymes: No results for input(s): CKTOTAL, CKMB, CKMBINDEX, TROPONINI in the last 168 hours. CBG: No  results for input(s): GLUCAP in the last 168 hours. Iron Studies: No results for input(s): IRON, TIBC, TRANSFERRIN, FERRITIN in the last 72 hours. Studies/Results: CT ABDOMEN PELVIS W CONTRAST  Result Date: 06/27/2021 CLINICAL DATA:  Abdominal pain EXAM: CT ABDOMEN AND PELVIS WITH CONTRAST TECHNIQUE: Multidetector CT imaging of the abdomen and pelvis was performed using the standard protocol following bolus administration of intravenous contrast. CONTRAST:  17mL OMNIPAQUE IOHEXOL 300 MG/ML  SOLN COMPARISON:  CT abdomen/pelvis 05/29/2021 FINDINGS: Lower chest: There are small bilateral pleural effusions. The heart is markedly enlarged. There is no pericardial effusion to the level imaged. A calcification in the left ventricle is again noted. Hepatobiliary: No focal liver lesions are seen. The gallbladder is surgically absent. There is no biliary ductal dilatation. The previously seen biliary stent has been removed. Pancreas: Unremarkable. The previously seen pancreatic stent has been removed. Spleen: The spleen is atrophic with coarse calcifications, unchanged. Adrenals/Urinary Tract: The adrenals are unremarkable. The kidneys are atrophic bilaterally. Multiple cysts are noted bilaterally. There are no stones. There is no hydronephrosis or hydroureter. Bladder is decompressed, likely accounting for the wall thickness. Stomach/Bowel: A linear metallic density foreign body is noted in the stomach likely reflecting a vascular clip. The stomach is otherwise unremarkable. Postsurgical changes are noted in the right lower quadrant consistent with bowel resection. There is no evidence of mechanical small bowel obstruction. There is no definite abnormal bowel wall thickening or inflammatory change. The appendix is normal. Vascular/Lymphatic: There is scattered calcified atherosclerotic plaque throughout the nonaneurysmal abdominal aorta. The major branch vessels are patent proximally. The main portal vein is patent.  There is no abdominal or pelvic lymphadenopathy. Reproductive: The prostate and seminal vesicles are unremarkable. Other: There is moderate-to-large volume ascites, increased since 05/29/2021. There is no organized or drainable fluid collection. There is no free intraperitoneal air. Musculoskeletal: Diffuse sclerosis throughout the bones is again favored to reflect sequela of sickle cell and/or renal disease. IMPRESSION: 1. Moderate-to-large volume ascites, increased since 05/29/2021. 2. Small bilateral pleural effusions. 3. Postsurgical changes reflecting partial small bowel resection without evidence of obstruction. Electronically Signed   By: Valetta Mole M.D.   On: 06/27/2021 09:43   DG Chest Port 1 View  Result Date: 06/27/2021 CLINICAL DATA:  66 year old male with history of weakness and hypotension. EXAM: PORTABLE CHEST 1 VIEW COMPARISON:  Chest x-ray 05/28/2021. FINDINGS: Lung volumes are low. No consolidative airspace disease. No pleural effusions. No pneumothorax. No pulmonary nodule or mass noted. Pulmonary vasculature and the cardiomediastinal silhouette are within normal limits. Atherosclerotic calcifications in the thoracic aorta. IMPRESSION: 1. Low lung volumes without radiographic evidence of acute cardiopulmonary disease. 2. Aortic atherosclerosis. Electronically Signed   By: Vinnie Langton M.D.   On: 06/27/2021 07:55    ROS: As per HPI otherwise negative.   Physical Exam: Vitals:   06/27/21 1330 06/27/21 1400 06/27/21 1430 06/27/21 1500  BP: Marland Kitchen)  85/62 (!) 83/53 (!) 73/49 (!) 86/55  Pulse: 85 81 84 79  Resp: 17 12 14  (!) 30  Temp:      TempSrc:      SpO2: 100% 99% 100% 100%  Weight:      Height:         General: Chronically ill appearing male in acute distress. Head: Normocephalic, atraumatic, sclera non-icteric, mucus membranes are moist Neck: Supple. JVD not elevated. Lungs: Clear bilaterally to auscultation without wheezes, rales, or rhonchi. Breathing is  unlabored. Heart: Afib on monitor. HS irreg, No M/R/G Abdomen: Mid line abdominal drsg-needs changed. NABS, NT, ND Lower extremities:without edema or ischemic changes, no open wounds  Neuro: Alert and oriented X 3. Moves all extremities spontaneously. Psych:  Responds to questions appropriately with a normal affect. Dialysis Access: R AVF + T/B aneurysmal  Dialysis Orders: Center: [facility] T,Th,S 4 hrs 180NRe 400/600 65.5 kg 2.0K/2.0 Ca UFP 2 AVF -No heparin -Hectorol 9 mcg IV TIW -Mircera 200 mcg IV q 2 weeks (last dose 06/17/2021 ESA due 07/01/2021) -Venofer 100 mg IV X 10 doses 2/10 doses have been given -Sensipar 30 mg PO TIW  Assessment/Plan:  UGI-seen by GI. HGB 4.7 on admission. Being transfused. Per  GO  ESRD -  T,Th,S HD tomorrow off schedule  Hypertension/volume  - hypotensive at present. No evidence of volume overload but has been getting slightly under EDW at OP center. UF tomorrow as tolerated.   Anemia  - See # 1. Continue Fe load with HD. ESA due 07/01/2021.   Metabolic bone disease -  Continue binders, VDRA, sensipar when able to eat. Last OP labs at goal.   Nutrition - NPO H/O combined systolic/diastolic HF-follows with HF. Takes Torsemide 80 mg PO daily. Volume managed with HD.  H/O SSD  Kemiya Batdorf H. Owens Shark, NP-C 06/27/2021, 3:22 PM  [employer] [phone]

## 2021-06-27 NOTE — Assessment & Plan Note (Signed)
Legally blind 

## 2021-06-27 NOTE — ED Provider Notes (Signed)
[facility] EM[facility]ENC[facility] DEPARTMENT Provider Note   CSN: [id number] Arrival date & time: 06/27/21  5573     History  Chief Complaint  Patient presents with   Hypotension    Patrick [name] is a 66 y.o. male.  66 year old male with prior medical history as detailed below presents for evaluation.  Patient arrives by EMS.  Patient presented to dialysis.  He complained of feeling weak.  Dialysis noted hypotension with a blood pressure of approximately 80/50.  Patient was transported to the ED for further evaluation.  Patient did not receive dialysis prior to transport.  Patient reports onset of generalized weakness yesterday afternoon.  Patient complains of mild generalized abdominal discomfort and bloating.  He reports 1 episode of emesis yesterday.  Patient denies dark stool, bloody emesis, or bloody stool.  Of note, patient is essentially blind with significant difficulty seeing.  His last dialysis session was performed on Saturday without complication or issue.  He reports NOT taking his Eliquis for the last 2 to 3 days.  The history is provided by the patient.  Illness Location:  Weakness, hypotension, missed dialysis session today Severity:  Moderate Onset quality:  Gradual Duration:  1 day Timing:  Rare Progression:  Unchanged Chronicity:  New     Home Medications Prior to Admission medications   Medication Sig Start Date End Date Taking? Authorizing Provider  acetaminophen (TYLENOL) 500 MG tablet Take 1,000 mg by mouth every 6 (six) hours as needed for moderate pain or headache.    [provider]  albuterol (VENTOLIN HFA) 108 (90 Base) MCG/ACT inhaler Inhale 2 puffs into the lungs every 6 (six) hours as needed for wheezing or shortness of breath. Patient not taking: Reported on 05/19/2021 05/09/20   Freddi Starr, MD  allopurinol (ZYLOPRIM) 100 MG tablet TAKE 1 TABLET BY MOUTH EVERY DAY 06/09/21   [name], MD  AURYXIA 1 GM  210 MG(Fe) tablet Take 420 mg by mouth 3 (three) times daily. 02/11/21   [provider]  Budeson-Glycopyrrol-Formoterol (BREZTRI AEROSPHERE) 160-9-4.8 MCG/ACT AERO Inhale 2 puffs into the lungs in the morning and at bedtime. Patient not taking: Reported on 05/19/2021 12/21/20   Freddi Starr, MD  carvedilol (COREG) 3.125 MG tablet Take 1 tablet (3.125 mg total) by mouth 2 (two) times daily with a meal. 06/09/21   Florencia Reasons, MD  diphenhydramine-acetaminophen (TYLENOL PM) 25-500 MG TABS tablet Take 2 tablets by mouth at bedtime as needed (sleep).    [provider]  ELIQUIS 5 MG TABS tablet TAKE 1 TABLET BY MOUTH TWICE A DAY 02/21/21   Bensimhon, Shaune Pascal, MD  folic acid (FOLVITE) 1 MG tablet TAKE 1 TABLET BY MOUTH EVERY DAY 02/22/21   Dorena Dew, FNP  gabapentin (NEURONTIN) 100 MG capsule TAKE 1 CAPSULE BY MOUTH EVERYDAY AT BEDTIME 12/12/20   Dorena Dew, FNP  hydroxyurea (HYDREA) 500 MG capsule Take 1 capsule (500 mg total) by mouth daily. TAKE 1 CAPSULE BY MOUTH EVERY DAY WITH FOOD TO MINIMIZE GI SIDE EFFECTS 01/31/21   Dorena Dew, FNP  lidocaine-prilocaine (EMLA) cream Apply 1 application topically daily as needed (port access).  09/08/19   [provider]  pantoprazole (PROTONIX) 40 MG tablet Take 1 tablet (40 mg total) by mouth 2 (two) times daily. 06/09/21 06/09/22  Florencia Reasons, MD  tiZANidine (ZANAFLEX) 4 MG tablet TAKE 0.5 TABLET BY MOUTH EVERY 8 HOURS AS NEEDED FOR MUSCLE SPASMS Patient taking differently: Take 2 mg  by mouth every 8 (eight) hours as needed for muscle spasms. 02/22/21   Dorena Dew, FNP  Vitamin D, Ergocalciferol, (DRISDOL) 1.25 MG (50000 UNIT) CAPS capsule TAKE 1 CAPSULE BY MOUTH ONE TIME PER WEEK Patient taking differently: Take 50,000 Units by mouth every Saturday. 05/15/21   Dorena Dew, FNP      Allergies    Patient has no known allergies.    Review of Systems   Review of Systems  All other systems reviewed and are  negative.  Physical Exam Updated Vital Signs BP (!) 79/54 (BP Location: Left Arm)    Pulse 94    Temp 97.6 F (36.4 C) (Oral)    Resp 15    Ht 5\' 11"  (1.803 m)    Wt 67.6 kg    SpO2 100%    BMI 20.78 kg/m  Physical Exam Vitals and nursing note reviewed.  Constitutional:      General: He is not in acute distress.    Appearance: Normal appearance. He is well-developed.  HENT:     Head: Normocephalic and atraumatic.  Eyes:     Conjunctiva/sclera: Conjunctivae normal.     Pupils: Pupils are equal, round, and reactive to light.  Cardiovascular:     Rate and Rhythm: Normal rate and regular rhythm.     Heart sounds: Normal heart sounds.  Pulmonary:     Effort: Pulmonary effort is normal. No respiratory distress.     Breath sounds: Normal breath sounds.  Abdominal:     General: There is distension.     Palpations: Abdomen is soft.     Tenderness: There is abdominal tenderness.     Comments: Abdomen is mildly distended with minimal tenderness throughout all quadrants.  Midline incision has packing in place.  There is no significant surrounding erythema or drainage from the wound itself.  Genitourinary:    Comments: Melanotic stool present on exam Musculoskeletal:        General: No deformity. Normal range of motion.     Cervical back: Normal range of motion and neck supple.  Skin:    General: Skin is warm and dry.  Neurological:     General: No focal deficit present.     Mental Status: He is alert and oriented to person, place, and time.    ED Results / Procedures / Treatments   Labs (all labs ordered are listed, but only abnormal results are displayed) Labs Reviewed  POC OCCULT BLOOD, ED - Abnormal; Notable for the following components:      Result Value   Fecal Occult Bld POSITIVE (*)    All other components within normal limits  CULTURE, BLOOD (ROUTINE X 2)  CULTURE, BLOOD (ROUTINE X 2)  URINE CULTURE  RESP PANEL BY RT-PCR (FLU A&B, COVID) ARPGX2  URINALYSIS, ROUTINE W  REFLEX MICROSCOPIC  COMPREHENSIVE METABOLIC PANEL  CBC WITH DIFFERENTIAL/PLATELET  PROTIME-INR  LACTIC ACID, PLASMA  LACTIC ACID, PLASMA  LIPASE, BLOOD  TYPE AND SCREEN  TROPONIN I (HIGH SENSITIVITY)    EKG EKG Interpretation  Date/Time:  Tuesday June 27 2021 07:39:22 EST Ventricular Rate:  107 PR Interval:    QRS Duration: 102 QT Interval:  380 QTC Calculation: 507 R Axis:   76 Text Interpretation: Atrial fibrillation Low voltage, extremity leads Nonspecific T abnormalities, lateral leads Borderline ST elevation, lateral leads Prolonged QT interval Confirmed by Dene Gentry [phone]) on 06/27/2021 7:41:26 AM  Radiology DG Chest Port 1 View  Result Date: 06/27/2021 CLINICAL DATA:  66 year old male  with history of weakness and hypotension. EXAM: PORTABLE CHEST 1 VIEW COMPARISON:  Chest x-ray 05/28/2021. FINDINGS: Lung volumes are low. No consolidative airspace disease. No pleural effusions. No pneumothorax. No pulmonary nodule or mass noted. Pulmonary vasculature and the cardiomediastinal silhouette are within normal limits. Atherosclerotic calcifications in the thoracic aorta. IMPRESSION: 1. Low lung volumes without radiographic evidence of acute cardiopulmonary disease. 2. Aortic atherosclerosis. Electronically Signed   By: Vinnie Langton M.D.   On: 06/27/2021 07:55    Procedures Procedures    Medications Ordered in ED Medications  sodium chloride 0.9 % bolus 500 mL (has no administration in time range)  pantoprazole (PROTONIX) 80 mg /NS 100 mL IVPB (has no administration in time range)    ED Course/ Medical Decision Making/ A&P                           Medical Decision Making   Medical Screen Complete  This patient presented to the ED with complaint of weakness, hypertension, abdominal discomfort.  This complaint involves an extensive number of treatment options. The initial differential diagnosis includes, but is not limited to, bowel obstruction,  intra-abdominal infection, GI bleed, ESRD, metabolic abnormality  This presentation is: Acute, Chronic, Previously Undiagnosed, Uncertain Prognosis, Complicated, Systemic Symptoms, and Threat to Life/Bodily Function  Presented from dialysis via EMS.  Patient with complaint of generalized weakness and fatigue.  Dialysis and EMS reported blood pressures of 70/50.  Patient with recent complicated admission last month.  Among issues treated last month was acute upper GI bleed which required endoscopy for treatment of bleeding AVM.  Patient's exam and history are consistent with likely recurrent GI bleed.  Blood transfusion initiated here in the ED.  Hospitalist services or case and will consult.  The [facility] -[facility] -is aware of case and will consult.  Dr. Jonnie Finner with nephrology is aware of case.  Patient will require admission for further treatment and work-up.    Co morbidities that complicated the patient's evaluation  ESRD, hypertension, prior abdominal surgeries   Additional history obtained:  Additional history obtained from EMS External records from outside sources obtained and reviewed including prior ED visits and prior Inpatient records.    Lab Tests:  I ordered and personally interpreted labs.  The pertinent results include: Patient with new anemia with hemoglobin 4.7.  Melena present on exam with positive stool guaiac card.   Imaging Studies ordered:  I ordered imaging studies including chest x-ray, CT abdomen pelvis I independently visualized and interpreted obtained imaging which showed no acute pathology on chest x-ray, large volume ascites on CT. I agree with the radiologist interpretation.   Cardiac Monitoring:  The patient was maintained on a cardiac monitor.  I personally viewed and interpreted the cardiac monitor which showed an underlying rhythm of: Sinus rhythm   Medicines ordered:  I ordered medication including IV fluids, Protonix for  treatment of hypotension and presumed GI bleed Reevaluation of the patient after these medicines showed that the patient: stayed the same      Critical Interventions:  Initiation of transfusion of PRBCs.   Consultations Obtained:  I consulted GI, nephrology, and hospitalist services. I discussed lab and imaging findings as well as pertinent plan of care.  Patient will require admission for further work-up and treatment.   Problem List / ED Course:  Hypertension, suspected GI bleed, ESRD   Reevaluation:  After the interventions noted above, I reevaluated the patient and found that  they have: stayed the same    Dispostion:  After consideration of the diagnostic results and the patients response to treatment, I feel that the patent would benefit from admission.   CRITICAL CARE Performed by: Valarie Merino   Total critical care time: 45 minutes  Critical care time was exclusive of separately billable procedures and treating other patients.  Critical care was necessary to treat or prevent imminent or life-threatening deterioration.  Critical care was time spent personally by me on the following activities: development of treatment plan with patient and/or surrogate as well as nursing, discussions with consultants, evaluation of patient's response to treatment, examination of patient, obtaining history from patient or surrogate, ordering and performing treatments and interventions, ordering and review of laboratory studies, ordering and review of radiographic studies, pulse oximetry and re-evaluation of patient's condition.        Final Clinical Impression(s) / ED Diagnoses Final diagnoses:  Gastrointestinal hemorrhage, unspecified gastrointestinal hemorrhage type  Anemia, unspecified type  ESRD (end stage renal disease) ([place])    Rx / DC Orders ED Discharge Orders     None         Valarie Merino, MD 06/27/21 1043

## 2021-06-27 NOTE — Assessment & Plan Note (Signed)
-  Patient on chronic TTS HD -Nephrology prn order set utilized -Nephrology notified that patient will need HD

## 2021-06-27 NOTE — H&P (Signed)
History and Physical    Patient: Patrick [name] QAS:[id number] DOB: Mar 20, 1956 DOA: 06/27/2021 DOS: the patient was seen and examined on 06/27/2021 PCP: Dorena Dew, FNP  Patient coming from: Home - lives alone; NOK: Primus Bravo [place], [phone]   Chief Complaint: Weakness, hypotension  HPI: Patrick [name] is a 66 y.o. male with medical history significant of visual impairment; sickle cell anemia; afib on Eliquis; ESRD on TTS HD.  He was recently admitted in [place] for choledocholithiasis with ERCP and sphincterotomy (declined cholecystectomy) and then readmitted here from 12/2-23 for SBO and gangrenous cholecystitis.  He underwent diagnostic laparotomy, small bowel resection, and open cholecystectomy;his post-op course was complicated by ileus and GI bleeding s/p EGD with AVM bleeding in the stomach.  He returned today with weakness and hypotension, sent from HD.  He has been feeling weak since Sunday.  He felt ok prior to that but since then he has been weak, tired, and barely able to move around.  His bowels have been "a little slow".  He had a BM yesterday AM and cannot see but didn't have a sense there was anything wrong, "came out soft and smooth."  No abdominal pain other than post-operative pain.  No fever.  No SOB even with moving around.  He went to HD today and they sent him to the ER.    ER Course:  Felt tired yesterday, 1 episode of emesis.  Went to HD today, hypotension at HD and sent to ER.  Up to 80/52 now.  Hgb down to 4.7, has melena.  [place] scoped last month and found AVM in stomach.  Stopped Eliquis a day or two ago. GI will see.  Nephrology notified.  Ordered 3 units PRBC.  Giving Protonix.     Review of Systems: As mentioned in the history of present illness. All other systems reviewed and are negative. Past Medical History:  Diagnosis Date   Blood dyscrasia    Blood transfusion    "I've had a bunch of them"   CHF (congestive heart failure)  ([place])    followed by hf clinic   Chronic kidney disease (CKD), stage III (moderate) ([place])    Dialysis patient ([place]) 04/2019   AV fistula (right arm)   Gout    Headache(784.0)    "probably weekly" (01/13/2014)   Legally blind    Shortness of breath dyspnea    Sickle cell disease, type SS (HCC)    Swelling abdomen    Swelling of extremity, left    Swelling of extremity, right    Past Surgical History:  Procedure Laterality Date   A/V FISTULAGRAM Right 07/29/2020   Procedure: A/V FISTULAGRAM - Right Arm;  Surgeon: Angelia Mould, MD;  Location: [place] [facility];  Service: Cardiovascular;  Laterality: Right;   BASCILIC VEIN TRANSPOSITION Right 04/12/2014   Procedure: BASILIC VEIN TRANSPOSITION;  Surgeon: Elam Dutch, MD;  [facility] OR;  Service: Vascular;  Laterality: Right;   BIOPSY  08/04/2020   Procedure: BIOPSY;  Surgeon: Ladene Artist, MD;  Location: [facility] ENDOSCOPY;  Service: Gastroenterology;;   BOWEL RESECTION N/A 05/24/2021   Procedure: SMALL BOWEL RESECTION;  Surgeon: Rolm Bookbinder, MD;  Location: [place];  Service: General;  Laterality: N/A;   CARDIAC CATHETERIZATION  05/2011   CARDIOVERSION N/A 06/05/2018   Procedure: CARDIOVERSION;  Surgeon: Jolaine Artist, MD;  Location[facility] ENDOSCOPY;  Service: Cardiovascular;  Laterality: N/A;   CARDIOVERSION N/A 10/06/2019   Procedure: CARDIOVERSION;  Surgeon: Bensimhon,  Shaune Pascal, MD;  Location: [facility] ENDOSCOPY;  Service: Cardiovascular;  Laterality: N/A;   CHOLECYSTECTOMY N/A 05/24/2021   Procedure: OPEN CHOLECYSTECTOMY;  Surgeon: Rolm Bookbinder, MD;  Location: [place];  Service: General;  Laterality: N/A;   COLONOSCOPY WITH PROPOFOL N/A 09/24/2017   Procedure: COLONOSCOPY WITH PROPOFOL;  Surgeon: Jackquline Denmark, MD;  Location: [facility] ENDOSCOPY;  Service: Endoscopy;  Laterality: N/A;   ESOPHAGOGASTRODUODENOSCOPY N/A 09/19/2017   Procedure: ESOPHAGOGASTRODUODENOSCOPY (EGD);  Surgeon: Jackquline Denmark, MD;  Location: [facility] ENDOSCOPY;   Service: Endoscopy;  Laterality: N/A;   ESOPHAGOGASTRODUODENOSCOPY N/A 05/29/2021   Procedure: ESOPHAGOGASTRODUODENOSCOPY (EGD);  Surgeon: Milus Banister, MD;  Location: [facility] ENDOSCOPY;  Service: Gastroenterology;  Laterality: N/A;   ESOPHAGOGASTRODUODENOSCOPY (EGD) WITH PROPOFOL N/A 08/04/2020   Procedure: ESOPHAGOGASTRODUODENOSCOPY (EGD) WITH PROPOFOL;  Surgeon: Ladene Artist, MD;  Location: [place];  Service: Gastroenterology;  Laterality: N/A;   HEMOSTASIS CLIP PLACEMENT  05/29/2021   Procedure: HEMOSTASIS CLIP PLACEMENT;  Surgeon: Milus Banister, MD;  Location[facility] ENDOSCOPY;  Service: Gastroenterology;;   HOT HEMOSTASIS N/A 05/29/2021   Procedure: HOT HEMOSTASIS (ARGON PLASMA COAGULATION/BICAP);  Surgeon: Milus Banister, MD;  Location: [facility] ENDOSCOPY;  Service: Gastroenterology;  Laterality: N/A;   IR PERC TUN PERIT CATH WO PORT S&I /IMAG  05/23/2021   IR REMOVAL TUN CV CATH W/O FL  06/10/2021   IR US GUIDE VASC ACCESS RIGHT  05/23/2021   LAPAROSCOPY N/A 05/24/2021   Procedure: LAPAROSCOPY DIAGNOSTIC;  Surgeon: Rolm Bookbinder, MD;  Location: [place];  Service: General;  Laterality: N/A;   LAPAROTOMY N/A 05/24/2021   Procedure: EXPLORATORY LAPAROTOMY;  Surgeon: Rolm Bookbinder, MD;  Location: [place];  Service: General;  Laterality: N/A;   LEFT AND RIGHT HEART CATHETERIZATION WITH CORONARY ANGIOGRAM N/A 06/11/2011   Procedure: LEFT AND RIGHT HEART CATHETERIZATION WITH CORONARY ANGIOGRAM;  Surgeon: Larey Dresser, MD;  Location: [facility] CATH LAB;  Service: Cardiovascular;  Laterality: N/A;   PERIPHERAL VASCULAR BALLOON ANGIOPLASTY Right 07/29/2020   Procedure: PERIPHERAL VASCULAR BALLOON ANGIOPLASTY;  Surgeon: Angelia Mould, MD;  Location: [place] [facility];  Service: Cardiovascular;  Laterality: Right;  arm fistula   REVISON OF ARTERIOVENOUS FISTULA Right 08/08/2020   Procedure: ANEURYSM EXCISION OF RIGHT UPPER EXTREMITY ARTERIOVENOUS FISTULA;  Surgeon: Angelia Mould,  MD;  Location: [facility] OR;  Service: Vascular;  Laterality: Right;   SCLEROTHERAPY  05/29/2021   Procedure: Clide Deutscher;  Surgeon: Milus Banister, MD;  Location: [facility] ENDOSCOPY;  Service: Gastroenterology;;   Lavell Islam REMOVAL  05/29/2021   Procedure: STENT REMOVAL;  Surgeon: Milus Banister, MD;  Location: [facility] ENDOSCOPY;  Service: Gastroenterology;;   TEE WITHOUT CARDIOVERSION N/A 12/27/2015   Procedure: TRANSESOPHAGEAL ECHOCARDIOGRAM (TEE);  Surgeon: Sueanne Margarita, MD;  Location: [facility] ENDOSCOPY;  Service: Cardiovascular;  Laterality: N/A;   Social History:  reports that he quit smoking about 5 years ago. His smoking use included cigarettes. He has a 20.50 pack-year smoking history. He has never used smokeless tobacco. He reports that he does not currently use alcohol after a past usage of about 3.0 standard drinks per week. He reports that he does not use drugs.  No Known Allergies  Family History  Problem Relation Age of Onset   Alcohol abuse Mother    Liver disease Mother    Cirrhosis Mother    Heart attack Mother     Prior to Admission medications   Medication Sig Start Date End Date Taking? Authorizing Provider  acetaminophen (TYLENOL) 500 MG tablet Take 1,000 mg by  mouth every 6 (six) hours as needed for moderate pain or headache.   Yes [provider]  albuterol (VENTOLIN HFA) 108 (90 Base) MCG/ACT inhaler Inhale 2 puffs into the lungs every 6 (six) hours as needed for wheezing or shortness of breath. 05/09/20  Yes Freddi Starr, MD  allopurinol (ZYLOPRIM) 100 MG tablet TAKE 1 TABLET BY MOUTH EVERY DAY 06/09/21  Yes Jegede, Olugbemiga E, MD  AURYXIA 1 GM 210 MG(Fe) tablet Take 420 mg by mouth 3 (three) times daily. 02/11/21  Yes [provider]  carvedilol (COREG) 3.125 MG tablet Take 1 tablet (3.125 mg total) by mouth 2 (two) times daily with a meal. 06/09/21  Yes Florencia Reasons, MD  diphenhydramine-acetaminophen (TYLENOL PM) 25-500 MG TABS tablet Take 2 tablets by mouth at  bedtime as needed (sleep).   Yes [provider]  ELIQUIS 5 MG TABS tablet TAKE 1 TABLET BY MOUTH TWICE A DAY Patient taking differently: Take 5 mg by mouth 2 (two) times daily. 02/21/21  Yes Bensimhon, Shaune Pascal, MD  folic acid (FOLVITE) 1 MG tablet TAKE 1 TABLET BY MOUTH EVERY DAY Patient taking differently: Take 1 mg by mouth daily. 02/22/21  Yes Dorena Dew, FNP  gabapentin (NEURONTIN) 100 MG capsule TAKE 1 CAPSULE BY MOUTH EVERYDAY AT BEDTIME Patient taking differently: 100 mg at bedtime as needed (pain). 12/12/20  Yes Dorena Dew, FNP  hydroxyurea (HYDREA) 500 MG capsule Take 1 capsule (500 mg total) by mouth daily. TAKE 1 CAPSULE BY MOUTH EVERY DAY WITH FOOD TO MINIMIZE GI SIDE EFFECTS 01/31/21  Yes Dorena Dew, FNP  lidocaine-prilocaine (EMLA) cream Apply 1 application topically daily as needed (port access).  09/08/19  Yes [provider]  oxycodone (OXY-IR) 5 MG capsule Take 5-10 mg by mouth every 6 (six) hours as needed for pain.   Yes [provider]  pantoprazole (PROTONIX) 40 MG tablet Take 1 tablet (40 mg total) by mouth 2 (two) times daily. 06/09/21 06/09/22 Yes Florencia Reasons, MD  tiZANidine (ZANAFLEX) 4 MG tablet TAKE 0.5 TABLET BY MOUTH EVERY 8 HOURS AS NEEDED FOR MUSCLE SPASMS Patient taking differently: Take 2 mg by mouth every 8 (eight) hours as needed for muscle spasms. 02/22/21  Yes Dorena Dew, FNP  torsemide (DEMADEX) 20 MG tablet Take 80 mg by mouth daily.   Yes [provider]  Vitamin D, Ergocalciferol, (DRISDOL) 1.25 MG (50000 UNIT) CAPS capsule TAKE 1 CAPSULE BY MOUTH ONE TIME PER WEEK Patient taking differently: Take 50,000 Units by mouth once a week. Monday 05/15/21  Yes Dorena Dew, FNP  Budeson-Glycopyrrol-Formoterol (BREZTRI AEROSPHERE) 160-9-4.8 MCG/ACT AERO Inhale 2 puffs into the lungs in the morning and at bedtime. Patient not taking: Reported on 05/19/2021 12/21/20   Freddi Starr, MD    Physical  Exam: Vitals:   06/27/21 1608 06/27/21 1623 06/27/21 1630 06/27/21 1700  BP: 95/63 91/64 95/70  102/69  Pulse: 81 80 78 84  Resp: 19 15 12  (!) 30  Temp: 97.8 F (36.6 C) 97.8 F (36.6 C)    TempSrc: Oral Oral    SpO2:  100% 100% 100%  Weight:      Height:       General:  Appears calm and comfortable and is in NAD Eyes:  chronic visual impairment, poor eye contact ENT:  grossly normal hearing, lips & tongue, mmm Neck:  no LAD, masses or thyromegaly Cardiovascular:  RRR, 4/6 systolic murmur best heard at apex; no r/g. No LE edema.  Respiratory:   CTA bilaterally with no wheezes/rales/rhonchi.  Normal respiratory effort. Abdomen:  soft, NT, ND; healing ventral abdominal surgical incision with packing in place (currently dry and adhered to wound) Skin:  no rash or induration seen on limited exam Musculoskeletal:  grossly normal tone BUE/BLE, good ROM, no bony abnormality Psychiatric:  blunted mood and affect, speech fluent and appropriate, AOx3 Neurologic:  CN 2-12 grossly intact, moves all extremities in coordinated fashion   Radiological Exams on Admission: Independently reviewed - see discussion in A/P where applicable  CT ABDOMEN PELVIS W CONTRAST  Result Date: 06/27/2021 CLINICAL DATA:  Abdominal pain EXAM: CT ABDOMEN AND PELVIS WITH CONTRAST TECHNIQUE: Multidetector CT imaging of the abdomen and pelvis was performed using the standard protocol following bolus administration of intravenous contrast. CONTRAST:  34mL OMNIPAQUE IOHEXOL 300 MG/ML  SOLN COMPARISON:  CT abdomen/pelvis 05/29/2021 FINDINGS: Lower chest: There are small bilateral pleural effusions. The heart is markedly enlarged. There is no pericardial effusion to the level imaged. A calcification in the left ventricle is again noted. Hepatobiliary: No focal liver lesions are seen. The gallbladder is surgically absent. There is no biliary ductal dilatation. The previously seen biliary stent has been removed. Pancreas:  Unremarkable. The previously seen pancreatic stent has been removed. Spleen: The spleen is atrophic with coarse calcifications, unchanged. Adrenals/Urinary Tract: The adrenals are unremarkable. The kidneys are atrophic bilaterally. Multiple cysts are noted bilaterally. There are no stones. There is no hydronephrosis or hydroureter. Bladder is decompressed, likely accounting for the wall thickness. Stomach/Bowel: A linear metallic density foreign body is noted in the stomach likely reflecting a vascular clip. The stomach is otherwise unremarkable. Postsurgical changes are noted in the right lower quadrant consistent with bowel resection. There is no evidence of mechanical small bowel obstruction. There is no definite abnormal bowel wall thickening or inflammatory change. The appendix is normal. Vascular/Lymphatic: There is scattered calcified atherosclerotic plaque throughout the nonaneurysmal abdominal aorta. The major branch vessels are patent proximally. The main portal vein is patent. There is no abdominal or pelvic lymphadenopathy. Reproductive: The prostate and seminal vesicles are unremarkable. Other: There is moderate-to-large volume ascites, increased since 05/29/2021. There is no organized or drainable fluid collection. There is no free intraperitoneal air. Musculoskeletal: Diffuse sclerosis throughout the bones is again favored to reflect sequela of sickle cell and/or renal disease. IMPRESSION: 1. Moderate-to-large volume ascites, increased since 05/29/2021. 2. Small bilateral pleural effusions. 3. Postsurgical changes reflecting partial small bowel resection without evidence of obstruction. Electronically Signed   By: Valetta Mole M.D.   On: 06/27/2021 09:43   DG Chest Port 1 View  Result Date: 06/27/2021 CLINICAL DATA:  66 year old male with history of weakness and hypotension. EXAM: PORTABLE CHEST 1 VIEW COMPARISON:  Chest x-ray 05/28/2021. FINDINGS: Lung volumes are low. No consolidative airspace  disease. No pleural effusions. No pneumothorax. No pulmonary nodule or mass noted. Pulmonary vasculature and the cardiomediastinal silhouette are within normal limits. Atherosclerotic calcifications in the thoracic aorta. IMPRESSION: 1. Low lung volumes without radiographic evidence of acute cardiopulmonary disease. 2. Aortic atherosclerosis. Electronically Signed   By: Vinnie Langton M.D.   On: 06/27/2021 07:55    EKG: Independently reviewed.  Afib with rate 107; prolonged QTc 507; nonspecific ST changes with no evidence of acute ischemia   Labs on Admission: I have personally reviewed the available labs and imaging studies at the time of the admission.  Pertinent labs:    BUN 41/Creatinine 5.65/GFR 11 AP 563 Albumin <1.5 Bili 3.0 HS troponin  35 Lactate 1.3 WBC 14 Hgb 4.7; 7.8 on 12/22 INR 1.9 COVID/flu negative Heme positive Blood cultures pending   Assessment/Plan * Upper GI bleeding- (present on admission) -During his prior admission, he was noted to have a significant amount of blood coming from his NG tube -EGD showed a bleeding AVM in the stomach -He appeared to improve and was transitioned to PO BID PPI -Eliquis was resumed on 12/20 -He returned today with generalized weakness and fatigue -Rectal exam with melena, heme positive -Will observe in progressive bed  -GI consulted by ED, will follow up recommendations -NPO for possible EGD -LR at 50 mL/hr (will stop post-EGD) -Start IV pantoprazole 40 BID for patients with ongoing bleeding who need urgent EGD -Zofran IV for nausea -Avoid NSAIDs and SQ heparin -Maintain IV access (2 large bore IVs if possible).  ABLA -Patient's weakness and fatigue are most likely caused by anemia secondary to upper GI bleeding.  -His Hgb decreased from 7.8 to 4.9.  -Type and screen were done in ED.  -Three units of blood were ordered by ED.  -Monitor closely and follow cbc q12h, transfuse as necessary for Hbg <7  -This issue is  complicated by his h/o sickle cell anemia and anemia associated with chronic renal disease -PRBC x 10 units, FFP x 2 units during last hospitalization  H/O major abdominal surgery -Patient with recent significant GI surgery including choledocholithiasis and then gangrenous cholecystitis -He appears to be doing well from this standpoint -Wound care for his incision still is an issue, needs daily dressing changes  Prolonged QT interval- (present on admission) -Will attempt to avoid QT-prolonging medications such as nausea meds, SSRIs; unable to avoid PPI -Repeat EKG in AM  Permanent atrial fibrillation ([place])- (present on admission) -Patient was on and off amiodarone for rate control during last hospitalization -He was discharged on [place] for now given hypotension on presentation -He has been holding Eliquis for the last few days, will continue to hold  End stage renal disease ([place])- (present on admission) -Patient on chronic TTS HD -Nephrology prn order set utilized -Nephrology notified that patient will need HD  Hb-SS disease without crisis ([place])- (present on admission) -This may complicate transfusions -No evidence of crisis at this time  COPD not affecting current episode of care [facility])- (present on admission) -Quit smoking remotely -Appears comfortable -No longer using Breztri -Continue albuterol prn  Visual impairment -Legally blind  Chronic systolic CHF (congestive heart failure) ([place])- (present on admission) -Recent echo with EF 40-45% valvular disease, pulmonary HTN -Volume controlled with HD -Will need to be closely monitored given blood + fluids, may need HD sooner  Essential hypertension- (present on admission) -Discharged on Coreg -Was hypotensive on presentation -Hold Coreg     Advance Care Planning:   Code Status: DNR   Consults: Nephrology; GI  Family Communication: None present; I spoke with his cousin at the time of admission  Severity  of Illness: The appropriate patient status for this patient is OBSERVATION. Observation status is judged to be reasonable and necessary in order to provide the required intensity of service to ensure the patient's safety. The patient's presenting symptoms, physical exam findings, and initial radiographic and laboratory data in the context of their medical condition is felt to place them at decreased risk for further clinical deterioration. Furthermore, it is anticipated that the patient will be medically stable for discharge from the hospital within 2 midnights of admission.   Author: Karmen Bongo, MD 06/27/2021 5:26 PM  For on call review [email or web address].

## 2021-06-27 NOTE — H&P (View-Only) (Signed)
[facility] Consult: 10:35 AM 06/27/2021  LOS: 0 days    Referring Provider: Dr Francia Greaves in ED  Primary Care Physician:  Dorena Dew, FNP Primary Gastroenterologist:  [facility] as inpt only.      Reason for Consultation: Recurrent melena, recurrent blood loss anemia.   HPI: Patrick [name] is a 66 y.o. male.  PMH ESRD, on HD.  CHF.  A. fib on Eliquis.  Sickle cell dz. pulmonary hypertension.  Legally blind.   Has received/required PRBCs on multiple occasionsfor acute on chronic anemia, blood loss.  Baseline Hgb is ~ 7 to 8.    09/2017 colonoscopy for IDA, unexplained GI bleeding.  Small, nonbleeding internal hemorrhoids otherwise normal.  09/2017 EGD.  Erosive gastritis, benign biopsies 07/2020 EGD.  For blood loss anemia, hematemesis.  Ectopic gastric mucosa in proximal esophagus.  Nonbleeding erosive gastropathy, biopsied.  Gastric erythema, biopsied (benign, no H. Pylori).  Small HH.  Normal duodenal bulb and second duodenum 11/ 26/2022 ERCP at [facility].  Had SBO associated with choledocholithiasis.  Underwent sphincterotomy, clearance debris from CBD.  PD and CBD stent placed. Recurrent SBO.  05/24/2021 diagnostic laparoscopy, small bowel resection with primary anastomosis, open cholecystectomy.  Bilious ascites noted throughout the abdomen.  There was a segment of small bowel adhered to pelvis that was dilated, friable and looked like chronic ischemia.  GB looked like acute cholecystitis.  SP biopsy showed fibrosis, stricture.  GB pathology consistent with acute/chronic cholecystitis, cholelithiasis. 05/29/2021 EGD for melena.  Old blood clot and fresh blood in stomach precluding visualization of proximal stomach.  Grade C ulcerative esophagitis.  Active oozing from AVM at lesser curvature of stomach  began to breed briskly, treated with APC, 3 endoclips with some continued small oozing so epinephrine injected.  Plastic stent from biliary tract and pancreatic duct removed.  Starting Saturday experiencing weakness.  This is progressed.  He was so weak on Sunday and Monday that he did not get up and did not take any of his meds.  Several days of poor p.o. intake but no nausea or vomiting.  Having regular bowel movements and has not seen bloody or black stools.  Says that even with his diminished vision he can distinguish color, texture, quality of stools. Sent from dialysis today prior to even receiving HD.Marland Kitchen   + hypotension 80/50.  + Abd bloating/discomfort.  Non-bloody emesis x 1 yesterday (caveat is poor vision).   Stool melenic on ED DRE.      Hgb 4.7, was 7.8 on 12/22 (transfused PRBCs up from 6.5). MCV 86.  Platelets 254.  WBCs 14. Na 134, up from 129 three weeks ago.  T bili 3 (2.1 on 05/31/21).  Alk phos 563 (elevated to 263 three weeks ago). CTAP with contrast: Moderate to large volume ascites increased compared with 4 weeks ago.  Small bilateral pleural effusions.  Postsurgical changes C/W SB resection.  No intestinal obstruction. Note there was small peri-hepatic ascites on CT of 05/29/21.    Maternal hx alcoholism, cirrhosis, MI.  No family history of gastrointestinal or  stomach cancers, ulcer disease, GI bleeds.  Patient does not drink alcohol or use tobacco.  He lives independently.  His cousin helps him out, currently is helping change the dressings on his surgical wound.  Past Medical History:  Diagnosis Date   Blood dyscrasia    Blood transfusion    "I've had a bunch of them"   CHF (congestive heart failure) ([place])    followed by hf clinic   Chronic kidney disease (CKD), stage III (moderate) ([place])    Dialysis patient ([place]) 04/2019   AV fistula (right arm)   Gout    Headache(784.0)    "probably weekly" (01/13/2014)   Legally blind    Shortness of breath dyspnea    Sickle  cell disease, type SS (HCC)    Swelling abdomen    Swelling of extremity, left    Swelling of extremity, right     Past Surgical History:  Procedure Laterality Date   A/V FISTULAGRAM Right 07/29/2020   Procedure: A/V FISTULAGRAM - Right Arm;  Surgeon: Angelia Mould, MD;  Location: [place] [facility];  Service: Cardiovascular;  Laterality: Right;   BASCILIC VEIN TRANSPOSITION Right 04/12/2014   Procedure: BASILIC VEIN TRANSPOSITION;  Surgeon: Elam Dutch, MD;  Location: [place];  Service: Vascular;  Laterality: Right;   BIOPSY  08/04/2020   Procedure: BIOPSY;  Surgeon: Ladene Artist, MD;  Location: [facility] ENDOSCOPY;  Service: Gastroenterology;;   BOWEL RESECTION N/A 05/24/2021   Procedure: SMALL BOWEL RESECTION;  Surgeon: Rolm Bookbinder, MD;  Location: [place];  Service: General;  Laterality: N/A;   CARDIAC CATHETERIZATION  05/2011   CARDIOVERSION N/A 06/05/2018   Procedure: CARDIOVERSION;  Surgeon: Jolaine Artist, MD;  Location: [place];  Service: Cardiovascular;  Laterality: N/A;   CARDIOVERSION N/A 10/06/2019   Procedure: CARDIOVERSION;  Surgeon: Jolaine Artist, MD;  Location: [facility] ENDOSCOPY;  Service: Cardiovascular;  Laterality: N/A;   CHOLECYSTECTOMY N/A 05/24/2021   Procedure: OPEN CHOLECYSTECTOMY;  Surgeon: Rolm Bookbinder, MD;  Location: [place];  Service: General;  Laterality: N/A;   COLONOSCOPY WITH PROPOFOL N/A 09/24/2017   Procedure: COLONOSCOPY WITH PROPOFOL;  Surgeon: Jackquline Denmark, MD;  Location: [facility] ENDOSCOPY;  Service: Endoscopy;  Laterality: N/A;   ESOPHAGOGASTRODUODENOSCOPY N/A 09/19/2017   Procedure: ESOPHAGOGASTRODUODENOSCOPY (EGD);  Surgeon: Jackquline Denmark, MD;  Location: [facility] ENDOSCOPY;  Service: Endoscopy;  Laterality: N/A;   ESOPHAGOGASTRODUODENOSCOPY N/A 05/29/2021   Procedure: ESOPHAGOGASTRODUODENOSCOPY (EGD);  Surgeon: Milus Banister, MD;  Location: [facility] ENDOSCOPY;  Service: Gastroenterology;  Laterality: N/A;   ESOPHAGOGASTRODUODENOSCOPY (EGD)  WITH PROPOFOL N/A 08/04/2020   Procedure: ESOPHAGOGASTRODUODENOSCOPY (EGD) WITH PROPOFOL;  Surgeon: Ladene Artist, MD;  Location: [place];  Service: Gastroenterology;  Laterality: N/A;   HEMOSTASIS CLIP PLACEMENT  05/29/2021   Procedure: HEMOSTASIS CLIP PLACEMENT;  Surgeon: Milus Banister, MD;  Location: [facility] ENDOSCOPY;  Service: Gastroenterology;;   HOT HEMOSTASIS N/A 05/29/2021   Procedure: HOT HEMOSTASIS (ARGON PLASMA COAGULATION/BICAP);  Surgeon: Milus Banister, MD;  Location: [facility] ENDOSCOPY;  Service: Gastroenterology;  Laterality: N/A;   IR PERC TUN PERIT CATH WO PORT S&I /IMAG  05/23/2021   IR REMOVAL TUN CV CATH W/O FL  06/10/2021   IR US GUIDE VASC ACCESS RIGHT  05/23/2021   LAPAROSCOPY N/A 05/24/2021   Procedure: LAPAROSCOPY DIAGNOSTIC;  Surgeon: Rolm Bookbinder, MD;  Location: [place];  Service: General;  Laterality: N/A;   LAPAROTOMY N/A 05/24/2021   Procedure: EXPLORATORY LAPAROTOMY;  Surgeon: Rolm Bookbinder, MD;  Location: [place];  Service: General;  Laterality: N/A;   LEFT AND RIGHT HEART CATHETERIZATION WITH CORONARY ANGIOGRAM N/A 06/11/2011   Procedure: LEFT AND RIGHT HEART CATHETERIZATION WITH CORONARY ANGIOGRAM;  Surgeon: Larey Dresser, MD;  Location: [facility] CATH LAB;  Service: Cardiovascular;  Laterality: N/A;   PERIPHERAL VASCULAR BALLOON ANGIOPLASTY Right 07/29/2020   Procedure: PERIPHERAL VASCULAR BALLOON ANGIOPLASTY;  Surgeon: Angelia Mould, MD;  Location: [place] [facility];  Service: Cardiovascular;  Laterality: Right;  arm fistula   REVISON OF ARTERIOVENOUS FISTULA Right 08/08/2020   Procedure: ANEURYSM EXCISION OF RIGHT UPPER EXTREMITY ARTERIOVENOUS FISTULA;  Surgeon: Angelia Mould, MD;  Location: [facility] OR;  Service: Vascular;  Laterality: Right;   SCLEROTHERAPY  05/29/2021   Procedure: Clide Deutscher;  Surgeon: Milus Banister, MD;  Location: [facility] ENDOSCOPY;  Service: Gastroenterology;;   Lavell Islam REMOVAL  05/29/2021   Procedure: STENT REMOVAL;   Surgeon: Milus Banister, MD;  Location: [facility] ENDOSCOPY;  Service: Gastroenterology;;   TEE WITHOUT CARDIOVERSION N/A 12/27/2015   Procedure: TRANSESOPHAGEAL ECHOCARDIOGRAM (TEE);  Surgeon: Sueanne Margarita, MD;  Location: [facility] ENDOSCOPY;  Service: Cardiovascular;  Laterality: N/A;    Prior to Admission medications   Medication Sig Start Date End Date Taking? Authorizing Provider  acetaminophen (TYLENOL) 500 MG tablet Take 1,000 mg by mouth every 6 (six) hours as needed for moderate pain or headache.   Yes [provider]  albuterol (VENTOLIN HFA) 108 (90 Base) MCG/ACT inhaler Inhale 2 puffs into the lungs every 6 (six) hours as needed for wheezing or shortness of breath. 05/09/20  Yes Freddi Starr, MD  allopurinol (ZYLOPRIM) 100 MG tablet TAKE 1 TABLET BY MOUTH EVERY DAY 06/09/21  Yes Jegede, Olugbemiga E, MD  AURYXIA 1 GM 210 MG(Fe) tablet Take 420 mg by mouth 3 (three) times daily. 02/11/21  Yes [provider]  carvedilol (COREG) 3.125 MG tablet Take 1 tablet (3.125 mg total) by mouth 2 (two) times daily with a meal. 06/09/21  Yes Florencia Reasons, MD  diphenhydramine-acetaminophen (TYLENOL PM) 25-500 MG TABS tablet Take 2 tablets by mouth at bedtime as needed (sleep).   Yes [provider]  ELIQUIS 5 MG TABS tablet TAKE 1 TABLET BY MOUTH TWICE A DAY Patient taking differently: Take 5 mg by mouth 2 (two) times daily. 02/21/21  Yes Bensimhon, Shaune Pascal, MD  folic acid (FOLVITE) 1 MG tablet TAKE 1 TABLET BY MOUTH EVERY DAY Patient taking differently: Take 1 mg by mouth daily. 02/22/21  Yes Dorena Dew, FNP  gabapentin (NEURONTIN) 100 MG capsule TAKE 1 CAPSULE BY MOUTH EVERYDAY AT BEDTIME Patient taking differently: 100 mg at bedtime as needed (pain). 12/12/20  Yes Dorena Dew, FNP  hydroxyurea (HYDREA) 500 MG capsule Take 1 capsule (500 mg total) by mouth daily. TAKE 1 CAPSULE BY MOUTH EVERY DAY WITH FOOD TO MINIMIZE GI SIDE EFFECTS 01/31/21  Yes Dorena Dew, FNP   lidocaine-prilocaine (EMLA) cream Apply 1 application topically daily as needed (port access).  09/08/19  Yes [provider]  oxycodone (OXY-IR) 5 MG capsule Take 5-10 mg by mouth every 6 (six) hours as needed for pain.   Yes [provider]  pantoprazole (PROTONIX) 40 MG tablet Take 1 tablet (40 mg total) by mouth 2 (two) times daily. 06/09/21 06/09/22 Yes Florencia Reasons, MD  tiZANidine (ZANAFLEX) 4 MG tablet TAKE 0.5 TABLET BY MOUTH EVERY 8 HOURS AS NEEDED FOR MUSCLE SPASMS Patient taking differently: Take 2 mg by mouth every 8 (eight) hours as needed for muscle spasms. 02/22/21  Yes Dorena Dew, FNP  torsemide (DEMADEX) 20 MG tablet Take 80 mg by mouth daily.   Yes [provider]  Vitamin D, Ergocalciferol, (DRISDOL) 1.25 MG (50000 UNIT) CAPS capsule TAKE 1 CAPSULE BY MOUTH ONE TIME PER WEEK Patient taking differently: Take 50,000 Units by mouth once a week. Monday 05/15/21  Yes Dorena Dew, FNP  Budeson-Glycopyrrol-Formoterol (BREZTRI AEROSPHERE) 160-9-4.8 MCG/ACT AERO Inhale 2 puffs into the lungs in the morning and at bedtime. Patient not taking: Reported on 05/19/2021 12/21/20   Freddi Starr, MD    Scheduled Meds:  sodium chloride flush  3 mL Intravenous Q12H   Infusions:  sodium chloride     sodium chloride Stopped (06/27/21 0948)   PRN Meds: sodium chloride, sodium chloride flush   Allergies as of 06/27/2021   (No Known Allergies)    Family History  Problem Relation Age of Onset   Alcohol abuse Mother    Liver disease Mother    Cirrhosis Mother    Heart attack Mother     Social History   Socioeconomic History   Marital status: Divorced    Spouse name: Not on file   Number of children: Not on file   Years of education: Not on file   Highest education level: Not on file  Occupational History   Not on file  Tobacco Use   Smoking status: Former    Packs/day: 0.25    Years: 41.00    Pack years: 10.25    Types: Cigarettes     Quit date: 12/08/2015    Years since quitting: 5.5   Smokeless tobacco: Never  Vaping Use   Vaping Use: Never used  Substance and Sexual Activity   Alcohol use: Not Currently    Alcohol/week: 3.0 standard drinks    Types: 1 Glasses of wine, 2 Cans of beer per week    Comment: occasional   Drug use: No   Sexual activity: Not Currently  Other Topics Concern   Not on file  Social History Narrative   Not on file   Social Determinants of Health   Financial Resource Strain: Low Risk    Difficulty of Paying Living Expenses: Not very hard  Food Insecurity: No Food Insecurity   Worried About Charity fundraiser in the Last Year: Never true   Ran Out of Food in the Last Year: Never true  Transportation Needs: No Transportation Needs   Lack of Transportation (Medical): No   Lack of Transportation (Non-Medical): No  Physical Activity: Not on file  Stress: Not on file  Social Connections: Not on file  Intimate Partner Violence: Not on file    REVIEW OF SYSTEMS: Constitutional: Weakness. ENT:  No nose bleeds Pulm: No shortness of breath. CV:  No palpitations, no LE edema.  No angina GU: Oliguric at baseline. GI: See HPI.  Denies dysphagia, trouble chewing, abdominal pain.  Does endorse some slight abdominal swelling not associated with discomfort.. Heme: Denies unusual or excessive bleeding Transfusions: Innumerable PRBCs, platelet, FFP transfusions over the last several years. Neuro:  No headaches, no peripheral tingling or numbness Derm:  No itching, no rash or sores.  Endocrine:  No sweats or chills.  No polyuria or dysuria Immunization: Not queried. Travel:  None    PHYSICAL EXAM: Vital signs in last 24 hours: Vitals:   06/27/21 0900 06/27/21 0930  BP: (!) 86/58 (!) 85/62  Pulse: 83 (!) 47  Resp: 18 17  Temp:    SpO2: 100% 100%  Wt Readings from Last 3 Encounters:  06/27/21 67.6 kg  06/10/21 66.2 kg  02/28/21 69.9 kg    General: Pleasant, calm, comfortable,  non-ill-appearing Head: No facial asymmetry or swelling.  No signs of head trauma. Eyes: Conjunctiva pink.  No scleral icterus. Ears: Not hard of hearing Nose: No discharge or congestion Mouth: Some missing teeth.  Tongue midline.  Mucosa is moist, pink, clear. Neck: No JVD, no masses, no thyromegaly. Lungs: Clear bilaterally.  No labored breathing or cough. Heart: RRR.  No MRG.  S1, S2 present Abdomen: Soft without tenderness.  Not protuberant or distended.  Bowel sounds active.  Gauze attached to upper portion of laparoscopy scar.  It was adherent and I did not attempt to remove it.  There was no soiling of the gauze.  Visible portion of lap scar is somewhat crusty but healthy-appearing.   Rectal: Did not repeat.  There was melena per exam by Dr. Francia Greaves Musc/Skeltl: No obvious joint deformities, redness or swelling. Extremities: No CCE. Neurologic: No gross deficit.  Oriented x3.  Moves all 4 limbs without tremors. Skin: Skin a bit dry on the legs but no sores, rashes or suspicious lesions. Nodes: No cervical adenopathy. Psych: Pleasant, calm, cooperative.  Intake/Output from previous day: No intake/output data recorded. Intake/Output this shift: Total I/O In: 600.3 [IV Piggyback:600.3] Out: -   LAB RESULTS: Recent Labs    06/27/21 0830  WBC 14.0*  HGB 4.7*  HCT 13.6*  PLT 254   BMET Lab Results  Component Value Date   NA 134 (L) 06/27/2021   NA 129 (L) 06/08/2021   NA 130 (L) 06/07/2021   K 3.6 06/27/2021   K 4.4 06/08/2021   K 4.2 06/07/2021   CL 91 (L) 06/27/2021   CL 97 (L) 06/08/2021   CL 97 (L) 06/07/2021   CO2 28 06/27/2021   CO2 22 06/08/2021   CO2 23 06/07/2021   GLUCOSE 85 06/27/2021   GLUCOSE 85 06/08/2021   GLUCOSE 104 (H) 06/07/2021   BUN 41 (H) 06/27/2021   BUN 49 (H) 06/08/2021   BUN 45 (H) 06/07/2021   CREATININE 5.65 (H) 06/27/2021   CREATININE 4.73 (H) 06/08/2021   CREATININE 4.82 (H) 06/07/2021   CALCIUM 8.1 (L) 06/27/2021   CALCIUM 8.2  (L) 06/08/2021   CALCIUM 8.3 (L) 06/07/2021   LFT Recent Labs    06/27/21 0830  PROT 6.1*  ALBUMIN <1.5*  AST 23  ALT 14  ALKPHOS 563*  BILITOT 3.0*   PT/INR Lab Results  Component Value Date   INR 1.9 (H) 06/27/2021   INR 1.3 (H) 05/29/2021   INR 1.3 (H) 05/29/2021   Hepatitis Panel No results for input(s): HEPBSAG, HCVAB, HEPAIGM, HEPBIGM in the last 72 hours. C-Diff No components found for: CDIFF Lipase     Component Value Date/Time   LIPASE 30 06/27/2021 0830    Drugs of Abuse  No results found for: LABOPIA, COCAINSCRNUR, LABBENZ, AMPHETMU, THCU, LABBARB   RADIOLOGY STUDIES: CT ABDOMEN PELVIS W CONTRAST  Result Date: 06/27/2021 CLINICAL DATA:  Abdominal pain EXAM: CT ABDOMEN AND PELVIS WITH CONTRAST TECHNIQUE: Multidetector CT imaging of the abdomen and pelvis was performed using the standard protocol following bolus administration of intravenous contrast. CONTRAST:  2m OMNIPAQUE IOHEXOL 300 MG/ML  SOLN COMPARISON:  CT abdomen/pelvis 05/29/2021 FINDINGS: Lower chest: There are small bilateral pleural effusions. The heart is markedly enlarged. There is no pericardial effusion to the level imaged. A calcification in the left ventricle is again noted.  Hepatobiliary: No focal liver lesions are seen. The gallbladder is surgically absent. There is no biliary ductal dilatation. The previously seen biliary stent has been removed. Pancreas: Unremarkable. The previously seen pancreatic stent has been removed. Spleen: The spleen is atrophic with coarse calcifications, unchanged. Adrenals/Urinary Tract: The adrenals are unremarkable. The kidneys are atrophic bilaterally. Multiple cysts are noted bilaterally. There are no stones. There is no hydronephrosis or hydroureter. Bladder is decompressed, likely accounting for the wall thickness. Stomach/Bowel: A linear metallic density foreign body is noted in the stomach likely reflecting a vascular clip. The stomach is otherwise unremarkable.  Postsurgical changes are noted in the right lower quadrant consistent with bowel resection. There is no evidence of mechanical small bowel obstruction. There is no definite abnormal bowel wall thickening or inflammatory change. The appendix is normal. Vascular/Lymphatic: There is scattered calcified atherosclerotic plaque throughout the nonaneurysmal abdominal aorta. The major branch vessels are patent proximally. The main portal vein is patent. There is no abdominal or pelvic lymphadenopathy. Reproductive: The prostate and seminal vesicles are unremarkable. Other: There is moderate-to-large volume ascites, increased since 05/29/2021. There is no organized or drainable fluid collection. There is no free intraperitoneal air. Musculoskeletal: Diffuse sclerosis throughout the bones is again favored to reflect sequela of sickle cell and/or renal disease. IMPRESSION: 1. Moderate-to-large volume ascites, increased since 05/29/2021. 2. Small bilateral pleural effusions. 3. Postsurgical changes reflecting partial small bowel resection without evidence of obstruction. Electronically Signed   By: Valetta Mole M.D.   On: 06/27/2021 09:43   DG Chest Port 1 View  Result Date: 06/27/2021 CLINICAL DATA:  66 year old male with history of weakness and hypotension. EXAM: PORTABLE CHEST 1 VIEW COMPARISON:  Chest x-ray 05/28/2021. FINDINGS: Lung volumes are low. No consolidative airspace disease. No pleural effusions. No pneumothorax. No pulmonary nodule or mass noted. Pulmonary vasculature and the cardiomediastinal silhouette are within normal limits. Atherosclerotic calcifications in the thoracic aorta. IMPRESSION: 1. Low lung volumes without radiographic evidence of acute cardiopulmonary disease. 2. Aortic atherosclerosis. Electronically Signed   By: Vinnie Langton M.D.   On: 06/27/2021 07:55      IMPRESSION:   Upper GI bleed.  History of same. 05/29/2021 EGD:  05/29/2021 EGD: Grade C, nonbleeding ulcerative  esophagitis.  Actively bleeding gastric AVM treated with APC, epinephrine and Endo Clip.  Previously placed plastic stents (1 biliary, 1 pancreatic) present and removed.   Recurrent acute on chronic blood loss anemia.  Innumerable PRBCs over many years.  Latest PRBC x 10 during 05/2021 admission.        3 PRBCs ordered.  Hemodynamically stable.        Hx cholecystitis,  cholelithiasis, choledocholithiasis.  ERCP, stone extraction, biliary/pancreatic duct stenting 04/2021 in [place].  05/24/2021 diagnostic laparoscopy, small bowel resection with primary anastomosis, open cholecystectomy.  Saw Dr. Donne Hazel on 1/6 for fup.      A fib.  Has not taken Eliquis (or other meds) since 1/7.      ESRD.  Oliguric at baseline.  Due for HD today.     Hyponatremia.      CHF hx.  No s/s of acute heart failure.      PLAN:       SBE tmrw after 3 PRBC transfusions completed.   Start Protonix 40 mg IV bid and full liquid diet.  Already got PPI bolus.      Azucena Freed  06/27/2021, 10:35 AM Phone [phone]

## 2021-06-27 NOTE — Assessment & Plan Note (Signed)
-  Patient was on and off amiodarone for rate control during last hospitalization -He was discharged on [place] for now given hypotension on presentation -He has been holding Eliquis for the last few days, will continue to hold

## 2021-06-27 NOTE — Assessment & Plan Note (Signed)
-  Will attempt to avoid QT-prolonging medications such as nausea meds, SSRIs; unable to avoid PPI -Repeat EKG in AM

## 2021-06-27 NOTE — Assessment & Plan Note (Signed)
-  Quit smoking remotely -Appears comfortable -No longer using Breztri -Continue albuterol prn

## 2021-06-27 NOTE — Assessment & Plan Note (Signed)
-  Discharged on [place] -Was hypotensive on presentation -Hold Coreg

## 2021-06-27 NOTE — Assessment & Plan Note (Signed)
-  Patient with recent significant GI surgery including choledocholithiasis and then gangrenous cholecystitis -He appears to be doing well from this standpoint -Wound care for his incision still is an issue, needs daily dressing changes

## 2021-06-27 NOTE — ED Notes (Signed)
Pt requested to be placed on 2L [place] for comfort- no increased work of breathing. Pt states he likes to go on O2 when doing dialysis or other tx.

## 2021-06-27 NOTE — Assessment & Plan Note (Signed)
-  Recent echo with EF 40-45% valvular disease, pulmonary HTN -Volume controlled with HD -Will need to be closely monitored given blood + fluids, may need HD sooner

## 2021-06-27 NOTE — Progress Notes (Signed)
Patient arrived to unit at [place]. Aox4, legally blind. Standby assist. VSS. BP low. On cardiac monitor. Will be getting last unit of blood. Belongings within reach. Call bell given to patient. Bed alarm on for safety.   Reported off to Night RN patient needs last Unit of PRBC, current vitals and patient will be NPO at midnight.

## 2021-06-27 NOTE — Assessment & Plan Note (Signed)
-  This may complicate transfusions -No evidence of crisis at this time

## 2021-06-27 NOTE — ED Triage Notes (Signed)
Pt BIBA from dialysis. Pt has been having generalized weakness. Pt was hypotensive on EMS arrival, 78/49. Pt has no complaints of pain. Did not receive dialysis, had successful tx Sat

## 2021-06-27 NOTE — Consult Note (Addendum)
[facility] Consult: 10:35 AM 06/27/2021  LOS: 0 days    Referring Provider: Dr Francia Greaves in ED  Primary Care Physician:  Dorena Dew, FNP Primary Gastroenterologist:  [facility] as inpt only.      Reason for Consultation: Recurrent melena, recurrent blood loss anemia.   HPI: Patrick [name] is a 66 y.o. male.  PMH ESRD, on HD.  CHF.  A. fib on Eliquis.  Sickle cell dz. pulmonary hypertension.  Legally blind.   Has received/required PRBCs on multiple occasionsfor acute on chronic anemia, blood loss.  Baseline Hgb is ~ 7 to 8.    09/2017 colonoscopy for IDA, unexplained GI bleeding.  Small, nonbleeding internal hemorrhoids otherwise normal.  09/2017 EGD.  Erosive gastritis, benign biopsies 07/2020 EGD.  For blood loss anemia, hematemesis.  Ectopic gastric mucosa in proximal esophagus.  Nonbleeding erosive gastropathy, biopsied.  Gastric erythema, biopsied (benign, no H. Pylori).  Small HH.  Normal duodenal bulb and second duodenum 11/ 26/2022 ERCP at [facility].  Had SBO associated with choledocholithiasis.  Underwent sphincterotomy, clearance debris from CBD.  PD and CBD stent placed. Recurrent SBO.  05/24/2021 diagnostic laparoscopy, small bowel resection with primary anastomosis, open cholecystectomy.  Bilious ascites noted throughout the abdomen.  There was a segment of small bowel adhered to pelvis that was dilated, friable and looked like chronic ischemia.  GB looked like acute cholecystitis.  SP biopsy showed fibrosis, stricture.  GB pathology consistent with acute/chronic cholecystitis, cholelithiasis. 05/29/2021 EGD for melena.  Old blood clot and fresh blood in stomach precluding visualization of proximal stomach.  Grade C ulcerative esophagitis.  Active oozing from AVM at lesser curvature of stomach  began to breed briskly, treated with APC, 3 endoclips with some continued small oozing so epinephrine injected.  Plastic stent from biliary tract and pancreatic duct removed.  Starting Saturday experiencing weakness.  This is progressed.  He was so weak on Sunday and Monday that he did not get up and did not take any of his meds.  Several days of poor p.o. intake but no nausea or vomiting.  Having regular bowel movements and has not seen bloody or black stools.  Says that even with his diminished vision he can distinguish color, texture, quality of stools. Sent from dialysis today prior to even receiving HD.Marland Kitchen   + hypotension 80/50.  + Abd bloating/discomfort.  Non-bloody emesis x 1 yesterday (caveat is poor vision).   Stool melenic on ED DRE.      Hgb 4.7, was 7.8 on 12/22 (transfused PRBCs up from 6.5). MCV 86.  Platelets 254.  WBCs 14. Na 134, up from 129 three weeks ago.  T bili 3 (2.1 on 05/31/21).  Alk phos 563 (elevated to 263 three weeks ago). CTAP with contrast: Moderate to large volume ascites increased compared with 4 weeks ago.  Small bilateral pleural effusions.  Postsurgical changes C/W SB resection.  No intestinal obstruction. Note there was small peri-hepatic ascites on CT of 05/29/21.    Maternal hx alcoholism, cirrhosis, MI.  No family history of gastrointestinal or  stomach cancers, ulcer disease, GI bleeds.  Patient does not drink alcohol or use tobacco.  He lives independently.  His cousin helps him out, currently is helping change the dressings on his surgical wound.  Past Medical History:  Diagnosis Date   Blood dyscrasia    Blood transfusion    "I've had a bunch of them"   CHF (congestive heart failure) ([place])    followed by hf clinic   Chronic kidney disease (CKD), stage III (moderate) ([place])    Dialysis patient ([place]) 04/2019   AV fistula (right arm)   Gout    Headache(784.0)    "probably weekly" (01/13/2014)   Legally blind    Shortness of breath dyspnea    Sickle  cell disease, type SS (HCC)    Swelling abdomen    Swelling of extremity, left    Swelling of extremity, right     Past Surgical History:  Procedure Laterality Date   A/V FISTULAGRAM Right 07/29/2020   Procedure: A/V FISTULAGRAM - Right Arm;  Surgeon: Angelia Mould, MD;  Location: [place] [facility];  Service: Cardiovascular;  Laterality: Right;   BASCILIC VEIN TRANSPOSITION Right 04/12/2014   Procedure: BASILIC VEIN TRANSPOSITION;  Surgeon: Elam Dutch, MD;  Location: [place];  Service: Vascular;  Laterality: Right;   BIOPSY  08/04/2020   Procedure: BIOPSY;  Surgeon: Ladene Artist, MD;  Location: [facility] ENDOSCOPY;  Service: Gastroenterology;;   BOWEL RESECTION N/A 05/24/2021   Procedure: SMALL BOWEL RESECTION;  Surgeon: Rolm Bookbinder, MD;  Location: [place];  Service: General;  Laterality: N/A;   CARDIAC CATHETERIZATION  05/2011   CARDIOVERSION N/A 06/05/2018   Procedure: CARDIOVERSION;  Surgeon: Jolaine Artist, MD;  Location: [place];  Service: Cardiovascular;  Laterality: N/A;   CARDIOVERSION N/A 10/06/2019   Procedure: CARDIOVERSION;  Surgeon: Jolaine Artist, MD;  Location: [facility] ENDOSCOPY;  Service: Cardiovascular;  Laterality: N/A;   CHOLECYSTECTOMY N/A 05/24/2021   Procedure: OPEN CHOLECYSTECTOMY;  Surgeon: Rolm Bookbinder, MD;  Location: [place];  Service: General;  Laterality: N/A;   COLONOSCOPY WITH PROPOFOL N/A 09/24/2017   Procedure: COLONOSCOPY WITH PROPOFOL;  Surgeon: Jackquline Denmark, MD;  Location: [facility] ENDOSCOPY;  Service: Endoscopy;  Laterality: N/A;   ESOPHAGOGASTRODUODENOSCOPY N/A 09/19/2017   Procedure: ESOPHAGOGASTRODUODENOSCOPY (EGD);  Surgeon: Jackquline Denmark, MD;  Location: [facility] ENDOSCOPY;  Service: Endoscopy;  Laterality: N/A;   ESOPHAGOGASTRODUODENOSCOPY N/A 05/29/2021   Procedure: ESOPHAGOGASTRODUODENOSCOPY (EGD);  Surgeon: Milus Banister, MD;  Location: [facility] ENDOSCOPY;  Service: Gastroenterology;  Laterality: N/A;   ESOPHAGOGASTRODUODENOSCOPY (EGD)  WITH PROPOFOL N/A 08/04/2020   Procedure: ESOPHAGOGASTRODUODENOSCOPY (EGD) WITH PROPOFOL;  Surgeon: Ladene Artist, MD;  Location: [place];  Service: Gastroenterology;  Laterality: N/A;   HEMOSTASIS CLIP PLACEMENT  05/29/2021   Procedure: HEMOSTASIS CLIP PLACEMENT;  Surgeon: Milus Banister, MD;  Location: [facility] ENDOSCOPY;  Service: Gastroenterology;;   HOT HEMOSTASIS N/A 05/29/2021   Procedure: HOT HEMOSTASIS (ARGON PLASMA COAGULATION/BICAP);  Surgeon: Milus Banister, MD;  Location: [facility] ENDOSCOPY;  Service: Gastroenterology;  Laterality: N/A;   IR PERC TUN PERIT CATH WO PORT S&I /IMAG  05/23/2021   IR REMOVAL TUN CV CATH W/O FL  06/10/2021   IR US GUIDE VASC ACCESS RIGHT  05/23/2021   LAPAROSCOPY N/A 05/24/2021   Procedure: LAPAROSCOPY DIAGNOSTIC;  Surgeon: Rolm Bookbinder, MD;  Location: [place];  Service: General;  Laterality: N/A;   LAPAROTOMY N/A 05/24/2021   Procedure: EXPLORATORY LAPAROTOMY;  Surgeon: Rolm Bookbinder, MD;  Location: [place];  Service: General;  Laterality: N/A;   LEFT AND RIGHT HEART CATHETERIZATION WITH CORONARY ANGIOGRAM N/A 06/11/2011   Procedure: LEFT AND RIGHT HEART CATHETERIZATION WITH CORONARY ANGIOGRAM;  Surgeon: Larey Dresser, MD;  Location: [facility] CATH LAB;  Service: Cardiovascular;  Laterality: N/A;   PERIPHERAL VASCULAR BALLOON ANGIOPLASTY Right 07/29/2020   Procedure: PERIPHERAL VASCULAR BALLOON ANGIOPLASTY;  Surgeon: Angelia Mould, MD;  Location: [place] [facility];  Service: Cardiovascular;  Laterality: Right;  arm fistula   REVISON OF ARTERIOVENOUS FISTULA Right 08/08/2020   Procedure: ANEURYSM EXCISION OF RIGHT UPPER EXTREMITY ARTERIOVENOUS FISTULA;  Surgeon: Angelia Mould, MD;  Location: [facility] OR;  Service: Vascular;  Laterality: Right;   SCLEROTHERAPY  05/29/2021   Procedure: Clide Deutscher;  Surgeon: Milus Banister, MD;  Location: [facility] ENDOSCOPY;  Service: Gastroenterology;;   Lavell Islam REMOVAL  05/29/2021   Procedure: STENT REMOVAL;   Surgeon: Milus Banister, MD;  Location: [facility] ENDOSCOPY;  Service: Gastroenterology;;   TEE WITHOUT CARDIOVERSION N/A 12/27/2015   Procedure: TRANSESOPHAGEAL ECHOCARDIOGRAM (TEE);  Surgeon: Sueanne Margarita, MD;  Location: [facility] ENDOSCOPY;  Service: Cardiovascular;  Laterality: N/A;    Prior to Admission medications   Medication Sig Start Date End Date Taking? Authorizing Provider  acetaminophen (TYLENOL) 500 MG tablet Take 1,000 mg by mouth every 6 (six) hours as needed for moderate pain or headache.   Yes [provider]  albuterol (VENTOLIN HFA) 108 (90 Base) MCG/ACT inhaler Inhale 2 puffs into the lungs every 6 (six) hours as needed for wheezing or shortness of breath. 05/09/20  Yes Freddi Starr, MD  allopurinol (ZYLOPRIM) 100 MG tablet TAKE 1 TABLET BY MOUTH EVERY DAY 06/09/21  Yes Jegede, Olugbemiga E, MD  AURYXIA 1 GM 210 MG(Fe) tablet Take 420 mg by mouth 3 (three) times daily. 02/11/21  Yes [provider]  carvedilol (COREG) 3.125 MG tablet Take 1 tablet (3.125 mg total) by mouth 2 (two) times daily with a meal. 06/09/21  Yes Florencia Reasons, MD  diphenhydramine-acetaminophen (TYLENOL PM) 25-500 MG TABS tablet Take 2 tablets by mouth at bedtime as needed (sleep).   Yes [provider]  ELIQUIS 5 MG TABS tablet TAKE 1 TABLET BY MOUTH TWICE A DAY Patient taking differently: Take 5 mg by mouth 2 (two) times daily. 02/21/21  Yes Bensimhon, Shaune Pascal, MD  folic acid (FOLVITE) 1 MG tablet TAKE 1 TABLET BY MOUTH EVERY DAY Patient taking differently: Take 1 mg by mouth daily. 02/22/21  Yes Dorena Dew, FNP  gabapentin (NEURONTIN) 100 MG capsule TAKE 1 CAPSULE BY MOUTH EVERYDAY AT BEDTIME Patient taking differently: 100 mg at bedtime as needed (pain). 12/12/20  Yes Dorena Dew, FNP  hydroxyurea (HYDREA) 500 MG capsule Take 1 capsule (500 mg total) by mouth daily. TAKE 1 CAPSULE BY MOUTH EVERY DAY WITH FOOD TO MINIMIZE GI SIDE EFFECTS 01/31/21  Yes Dorena Dew, FNP   lidocaine-prilocaine (EMLA) cream Apply 1 application topically daily as needed (port access).  09/08/19  Yes [provider]  oxycodone (OXY-IR) 5 MG capsule Take 5-10 mg by mouth every 6 (six) hours as needed for pain.   Yes [provider]  pantoprazole (PROTONIX) 40 MG tablet Take 1 tablet (40 mg total) by mouth 2 (two) times daily. 06/09/21 06/09/22 Yes Florencia Reasons, MD  tiZANidine (ZANAFLEX) 4 MG tablet TAKE 0.5 TABLET BY MOUTH EVERY 8 HOURS AS NEEDED FOR MUSCLE SPASMS Patient taking differently: Take 2 mg by mouth every 8 (eight) hours as needed for muscle spasms. 02/22/21  Yes Dorena Dew, FNP  torsemide (DEMADEX) 20 MG tablet Take 80 mg by mouth daily.   Yes [provider]  Vitamin D, Ergocalciferol, (DRISDOL) 1.25 MG (50000 UNIT) CAPS capsule TAKE 1 CAPSULE BY MOUTH ONE TIME PER WEEK Patient taking differently: Take 50,000 Units by mouth once a week. Monday 05/15/21  Yes Dorena Dew, FNP  Budeson-Glycopyrrol-Formoterol (BREZTRI AEROSPHERE) 160-9-4.8 MCG/ACT AERO Inhale 2 puffs into the lungs in the morning and at bedtime. Patient not taking: Reported on 05/19/2021 12/21/20   Freddi Starr, MD    Scheduled Meds:  sodium chloride flush  3 mL Intravenous Q12H   Infusions:  sodium chloride     sodium chloride Stopped (06/27/21 0948)   PRN Meds: sodium chloride, sodium chloride flush   Allergies as of 06/27/2021   (No Known Allergies)    Family History  Problem Relation Age of Onset   Alcohol abuse Mother    Liver disease Mother    Cirrhosis Mother    Heart attack Mother     Social History   Socioeconomic History   Marital status: Divorced    Spouse name: Not on file   Number of children: Not on file   Years of education: Not on file   Highest education level: Not on file  Occupational History   Not on file  Tobacco Use   Smoking status: Former    Packs/day: 0.25    Years: 41.00    Pack years: 10.25    Types: Cigarettes     Quit date: 12/08/2015    Years since quitting: 5.5   Smokeless tobacco: Never  Vaping Use   Vaping Use: Never used  Substance and Sexual Activity   Alcohol use: Not Currently    Alcohol/week: 3.0 standard drinks    Types: 1 Glasses of wine, 2 Cans of beer per week    Comment: occasional   Drug use: No   Sexual activity: Not Currently  Other Topics Concern   Not on file  Social History Narrative   Not on file   Social Determinants of Health   Financial Resource Strain: Low Risk    Difficulty of Paying Living Expenses: Not very hard  Food Insecurity: No Food Insecurity   Worried About Charity fundraiser in the Last Year: Never true   Ran Out of Food in the Last Year: Never true  Transportation Needs: No Transportation Needs   Lack of Transportation (Medical): No   Lack of Transportation (Non-Medical): No  Physical Activity: Not on file  Stress: Not on file  Social Connections: Not on file  Intimate Partner Violence: Not on file    REVIEW OF SYSTEMS: Constitutional: Weakness. ENT:  No nose bleeds Pulm: No shortness of breath. CV:  No palpitations, no LE edema.  No angina GU: Oliguric at baseline. GI: See HPI.  Denies dysphagia, trouble chewing, abdominal pain.  Does endorse some slight abdominal swelling not associated with discomfort.. Heme: Denies unusual or excessive bleeding Transfusions: Innumerable PRBCs, platelet, FFP transfusions over the last several years. Neuro:  No headaches, no peripheral tingling or numbness Derm:  No itching, no rash or sores.  Endocrine:  No sweats or chills.  No polyuria or dysuria Immunization: Not queried. Travel:  None    PHYSICAL EXAM: Vital signs in last 24 hours: Vitals:   06/27/21 0900 06/27/21 0930  BP: (!) 86/58 (!) 85/62  Pulse: 83 (!) 47  Resp: 18 17  Temp:    SpO2: 100% 100%  Wt Readings from Last 3 Encounters:  06/27/21 67.6 kg  06/10/21 66.2 kg  02/28/21 69.9 kg    General: Pleasant, calm, comfortable,  non-ill-appearing Head: No facial asymmetry or swelling.  No signs of head trauma. Eyes: Conjunctiva pink.  No scleral icterus. Ears: Not hard of hearing Nose: No discharge or congestion Mouth: Some missing teeth.  Tongue midline.  Mucosa is moist, pink, clear. Neck: No JVD, no masses, no thyromegaly. Lungs: Clear bilaterally.  No labored breathing or cough. Heart: RRR.  No MRG.  S1, S2 present Abdomen: Soft without tenderness.  Not protuberant or distended.  Bowel sounds active.  Gauze attached to upper portion of laparoscopy scar.  It was adherent and I did not attempt to remove it.  There was no soiling of the gauze.  Visible portion of lap scar is somewhat crusty but healthy-appearing.   Rectal: Did not repeat.  There was melena per exam by Dr. Francia Greaves Musc/Skeltl: No obvious joint deformities, redness or swelling. Extremities: No CCE. Neurologic: No gross deficit.  Oriented x3.  Moves all 4 limbs without tremors. Skin: Skin a bit dry on the legs but no sores, rashes or suspicious lesions. Nodes: No cervical adenopathy. Psych: Pleasant, calm, cooperative.  Intake/Output from previous day: No intake/output data recorded. Intake/Output this shift: Total I/O In: 600.3 [IV Piggyback:600.3] Out: -   LAB RESULTS: Recent Labs    06/27/21 0830  WBC 14.0*  HGB 4.7*  HCT 13.6*  PLT 254   BMET Lab Results  Component Value Date   NA 134 (L) 06/27/2021   NA 129 (L) 06/08/2021   NA 130 (L) 06/07/2021   K 3.6 06/27/2021   K 4.4 06/08/2021   K 4.2 06/07/2021   CL 91 (L) 06/27/2021   CL 97 (L) 06/08/2021   CL 97 (L) 06/07/2021   CO2 28 06/27/2021   CO2 22 06/08/2021   CO2 23 06/07/2021   GLUCOSE 85 06/27/2021   GLUCOSE 85 06/08/2021   GLUCOSE 104 (H) 06/07/2021   BUN 41 (H) 06/27/2021   BUN 49 (H) 06/08/2021   BUN 45 (H) 06/07/2021   CREATININE 5.65 (H) 06/27/2021   CREATININE 4.73 (H) 06/08/2021   CREATININE 4.82 (H) 06/07/2021   CALCIUM 8.1 (L) 06/27/2021   CALCIUM 8.2  (L) 06/08/2021   CALCIUM 8.3 (L) 06/07/2021   LFT Recent Labs    06/27/21 0830  PROT 6.1*  ALBUMIN <1.5*  AST 23  ALT 14  ALKPHOS 563*  BILITOT 3.0*   PT/INR Lab Results  Component Value Date   INR 1.9 (H) 06/27/2021   INR 1.3 (H) 05/29/2021   INR 1.3 (H) 05/29/2021   Hepatitis Panel No results for input(s): HEPBSAG, HCVAB, HEPAIGM, HEPBIGM in the last 72 hours. C-Diff No components found for: CDIFF Lipase     Component Value Date/Time   LIPASE 30 06/27/2021 0830    Drugs of Abuse  No results found for: LABOPIA, COCAINSCRNUR, LABBENZ, AMPHETMU, THCU, LABBARB   RADIOLOGY STUDIES: CT ABDOMEN PELVIS W CONTRAST  Result Date: 06/27/2021 CLINICAL DATA:  Abdominal pain EXAM: CT ABDOMEN AND PELVIS WITH CONTRAST TECHNIQUE: Multidetector CT imaging of the abdomen and pelvis was performed using the standard protocol following bolus administration of intravenous contrast. CONTRAST:  66m OMNIPAQUE IOHEXOL 300 MG/ML  SOLN COMPARISON:  CT abdomen/pelvis 05/29/2021 FINDINGS: Lower chest: There are small bilateral pleural effusions. The heart is markedly enlarged. There is no pericardial effusion to the level imaged. A calcification in the left ventricle is again noted.  Hepatobiliary: No focal liver lesions are seen. The gallbladder is surgically absent. There is no biliary ductal dilatation. The previously seen biliary stent has been removed. Pancreas: Unremarkable. The previously seen pancreatic stent has been removed. Spleen: The spleen is atrophic with coarse calcifications, unchanged. Adrenals/Urinary Tract: The adrenals are unremarkable. The kidneys are atrophic bilaterally. Multiple cysts are noted bilaterally. There are no stones. There is no hydronephrosis or hydroureter. Bladder is decompressed, likely accounting for the wall thickness. Stomach/Bowel: A linear metallic density foreign body is noted in the stomach likely reflecting a vascular clip. The stomach is otherwise unremarkable.  Postsurgical changes are noted in the right lower quadrant consistent with bowel resection. There is no evidence of mechanical small bowel obstruction. There is no definite abnormal bowel wall thickening or inflammatory change. The appendix is normal. Vascular/Lymphatic: There is scattered calcified atherosclerotic plaque throughout the nonaneurysmal abdominal aorta. The major branch vessels are patent proximally. The main portal vein is patent. There is no abdominal or pelvic lymphadenopathy. Reproductive: The prostate and seminal vesicles are unremarkable. Other: There is moderate-to-large volume ascites, increased since 05/29/2021. There is no organized or drainable fluid collection. There is no free intraperitoneal air. Musculoskeletal: Diffuse sclerosis throughout the bones is again favored to reflect sequela of sickle cell and/or renal disease. IMPRESSION: 1. Moderate-to-large volume ascites, increased since 05/29/2021. 2. Small bilateral pleural effusions. 3. Postsurgical changes reflecting partial small bowel resection without evidence of obstruction. Electronically Signed   By: Valetta Mole M.D.   On: 06/27/2021 09:43   DG Chest Port 1 View  Result Date: 06/27/2021 CLINICAL DATA:  66 year old male with history of weakness and hypotension. EXAM: PORTABLE CHEST 1 VIEW COMPARISON:  Chest x-ray 05/28/2021. FINDINGS: Lung volumes are low. No consolidative airspace disease. No pleural effusions. No pneumothorax. No pulmonary nodule or mass noted. Pulmonary vasculature and the cardiomediastinal silhouette are within normal limits. Atherosclerotic calcifications in the thoracic aorta. IMPRESSION: 1. Low lung volumes without radiographic evidence of acute cardiopulmonary disease. 2. Aortic atherosclerosis. Electronically Signed   By: Vinnie Langton M.D.   On: 06/27/2021 07:55      IMPRESSION:   Upper GI bleed.  History of same. 05/29/2021 EGD:  05/29/2021 EGD: Grade C, nonbleeding ulcerative  esophagitis.  Actively bleeding gastric AVM treated with APC, epinephrine and Endo Clip.  Previously placed plastic stents (1 biliary, 1 pancreatic) present and removed.   Recurrent acute on chronic blood loss anemia.  Innumerable PRBCs over many years.  Latest PRBC x 10 during 05/2021 admission.        3 PRBCs ordered.  Hemodynamically stable.        Hx cholecystitis,  cholelithiasis, choledocholithiasis.  ERCP, stone extraction, biliary/pancreatic duct stenting 04/2021 in [place].  05/24/2021 diagnostic laparoscopy, small bowel resection with primary anastomosis, open cholecystectomy.  Saw Dr. Donne Hazel on 1/6 for fup.      A fib.  Has not taken Eliquis (or other meds) since 1/7.      ESRD.  Oliguric at baseline.  Due for HD today.     Hyponatremia.      CHF hx.  No s/s of acute heart failure.      PLAN:       SBE tmrw after 3 PRBC transfusions completed.   Start Protonix 40 mg IV bid and full liquid diet.  Already got PPI bolus.      Azucena Freed  06/27/2021, 10:35 AM Phone [phone]

## 2021-06-27 NOTE — Assessment & Plan Note (Addendum)
-  During his prior admission, he was noted to have a significant amount of blood coming from his NG tube -EGD showed a bleeding AVM in the stomach -He appeared to improve and was transitioned to PO BID PPI -Eliquis was resumed on 12/20 -He returned today with generalized weakness and fatigue -Rectal exam with melena, heme positive -Will observe in progressive bed  -GI consulted by ED, will follow up recommendations -NPO for possible EGD -LR at 50 mL/hr (will stop post-EGD) -Start IV pantoprazole 40 BID for patients with ongoing bleeding who need urgent EGD -Zofran IV for nausea -Avoid NSAIDs and SQ heparin -Maintain IV access (2 large bore IVs if possible).  ABLA -Patient's weakness and fatigue are most likely caused by anemia secondary to upper GI bleeding.  -His Hgb decreased from 7.8 to 4.9.  -Type and screen were done in ED.  -Three units of blood were ordered by ED.  -Monitor closely and follow cbc q12h, transfuse as necessary for Hbg <7  -This issue is complicated by his h/o sickle cell anemia and anemia associated with chronic renal disease -PRBC x 10 units, FFP x 2 units during last hospitalization

## 2021-06-27 NOTE — ED Notes (Signed)
Pt states he does not make much urine

## 2021-06-28 ENCOUNTER — Inpatient Hospital Stay (HOSPITAL_COMMUNITY): Payer: HMO | Admitting: Anesthesiology

## 2021-06-28 ENCOUNTER — Encounter (HOSPITAL_COMMUNITY)
Admission: EM | Discharge status: Against Medical Advice | Payer: Self-pay | Source: Home / Self Care | Attending: Internal Medicine

## 2021-06-28 ENCOUNTER — Encounter (HOSPITAL_COMMUNITY): Payer: Self-pay | Admitting: Internal Medicine

## 2021-06-28 DIAGNOSIS — Z87891 Personal history of nicotine dependence: Secondary | ICD-10-CM | POA: Diagnosis not present

## 2021-06-28 DIAGNOSIS — N2581 Secondary hyperparathyroidism of renal origin: Secondary | ICD-10-CM | POA: Diagnosis present

## 2021-06-28 DIAGNOSIS — K222 Esophageal obstruction: Secondary | ICD-10-CM

## 2021-06-28 DIAGNOSIS — K2971 Gastritis, unspecified, with bleeding: Secondary | ICD-10-CM | POA: Diagnosis not present

## 2021-06-28 DIAGNOSIS — D571 Sickle-cell disease without crisis: Secondary | ICD-10-CM | POA: Diagnosis not present

## 2021-06-28 DIAGNOSIS — N186 End stage renal disease: Secondary | ICD-10-CM | POA: Diagnosis not present

## 2021-06-28 DIAGNOSIS — I953 Hypotension of hemodialysis: Secondary | ICD-10-CM | POA: Diagnosis not present

## 2021-06-28 DIAGNOSIS — K449 Diaphragmatic hernia without obstruction or gangrene: Secondary | ICD-10-CM | POA: Diagnosis not present

## 2021-06-28 DIAGNOSIS — K259 Gastric ulcer, unspecified as acute or chronic, without hemorrhage or perforation: Secondary | ICD-10-CM | POA: Diagnosis not present

## 2021-06-28 DIAGNOSIS — D649 Anemia, unspecified: Secondary | ICD-10-CM | POA: Diagnosis not present

## 2021-06-28 DIAGNOSIS — Z79899 Other long term (current) drug therapy: Secondary | ICD-10-CM | POA: Diagnosis not present

## 2021-06-28 DIAGNOSIS — I132 Hypertensive heart and chronic kidney disease with heart failure and with stage 5 chronic kidney disease, or end stage renal disease: Secondary | ICD-10-CM | POA: Diagnosis not present

## 2021-06-28 DIAGNOSIS — K922 Gastrointestinal hemorrhage, unspecified: Secondary | ICD-10-CM | POA: Diagnosis not present

## 2021-06-28 DIAGNOSIS — M109 Gout, unspecified: Secondary | ICD-10-CM | POA: Diagnosis not present

## 2021-06-28 DIAGNOSIS — Z4801 Encounter for change or removal of surgical wound dressing: Secondary | ICD-10-CM | POA: Diagnosis not present

## 2021-06-28 DIAGNOSIS — D500 Iron deficiency anemia secondary to blood loss (chronic): Secondary | ICD-10-CM | POA: Diagnosis not present

## 2021-06-28 DIAGNOSIS — N250 Renal osteodystrophy: Secondary | ICD-10-CM | POA: Diagnosis not present

## 2021-06-28 DIAGNOSIS — Z7901 Long term (current) use of anticoagulants: Secondary | ICD-10-CM | POA: Diagnosis not present

## 2021-06-28 DIAGNOSIS — I959 Hypotension, unspecified: Secondary | ICD-10-CM | POA: Diagnosis not present

## 2021-06-28 DIAGNOSIS — K319 Disease of stomach and duodenum, unspecified: Secondary | ICD-10-CM | POA: Diagnosis not present

## 2021-06-28 DIAGNOSIS — Z9115 Patient's noncompliance with renal dialysis: Secondary | ICD-10-CM | POA: Diagnosis not present

## 2021-06-28 DIAGNOSIS — E871 Hypo-osmolality and hyponatremia: Secondary | ICD-10-CM | POA: Diagnosis present

## 2021-06-28 DIAGNOSIS — Z66 Do not resuscitate: Secondary | ICD-10-CM | POA: Diagnosis not present

## 2021-06-28 DIAGNOSIS — D62 Acute posthemorrhagic anemia: Secondary | ICD-10-CM | POA: Diagnosis not present

## 2021-06-28 DIAGNOSIS — D631 Anemia in chronic kidney disease: Secondary | ICD-10-CM | POA: Diagnosis not present

## 2021-06-28 DIAGNOSIS — I4821 Permanent atrial fibrillation: Secondary | ICD-10-CM | POA: Diagnosis not present

## 2021-06-28 DIAGNOSIS — J449 Chronic obstructive pulmonary disease, unspecified: Secondary | ICD-10-CM | POA: Diagnosis not present

## 2021-06-28 DIAGNOSIS — I5022 Chronic systolic (congestive) heart failure: Secondary | ICD-10-CM | POA: Diagnosis not present

## 2021-06-28 DIAGNOSIS — K2101 Gastro-esophageal reflux disease with esophagitis, with bleeding: Secondary | ICD-10-CM | POA: Diagnosis not present

## 2021-06-28 DIAGNOSIS — Z9049 Acquired absence of other specified parts of digestive tract: Secondary | ICD-10-CM | POA: Diagnosis not present

## 2021-06-28 DIAGNOSIS — Z79891 Long term (current) use of opiate analgesic: Secondary | ICD-10-CM | POA: Diagnosis not present

## 2021-06-28 DIAGNOSIS — K2100 Gastro-esophageal reflux disease with esophagitis, without bleeding: Secondary | ICD-10-CM

## 2021-06-28 DIAGNOSIS — I272 Pulmonary hypertension, unspecified: Secondary | ICD-10-CM | POA: Diagnosis not present

## 2021-06-28 DIAGNOSIS — B3781 Candidal esophagitis: Secondary | ICD-10-CM | POA: Diagnosis not present

## 2021-06-28 DIAGNOSIS — K2901 Acute gastritis with bleeding: Secondary | ICD-10-CM | POA: Diagnosis not present

## 2021-06-28 DIAGNOSIS — I5040 Unspecified combined systolic (congestive) and diastolic (congestive) heart failure: Secondary | ICD-10-CM | POA: Diagnosis not present

## 2021-06-28 DIAGNOSIS — H548 Legal blindness, as defined in USA: Secondary | ICD-10-CM | POA: Diagnosis not present

## 2021-06-28 DIAGNOSIS — Z8249 Family history of ischemic heart disease and other diseases of the circulatory system: Secondary | ICD-10-CM | POA: Diagnosis not present

## 2021-06-28 DIAGNOSIS — Z992 Dependence on renal dialysis: Secondary | ICD-10-CM | POA: Diagnosis not present

## 2021-06-28 DIAGNOSIS — Z48815 Encounter for surgical aftercare following surgery on the digestive system: Secondary | ICD-10-CM | POA: Diagnosis not present

## 2021-06-28 DIAGNOSIS — Z20822 Contact with and (suspected) exposure to covid-19: Secondary | ICD-10-CM | POA: Diagnosis not present

## 2021-06-28 DIAGNOSIS — I5042 Chronic combined systolic (congestive) and diastolic (congestive) heart failure: Secondary | ICD-10-CM | POA: Diagnosis present

## 2021-06-28 HISTORY — PX: HOT HEMOSTASIS: SHX5433

## 2021-06-28 HISTORY — PX: ESOPHAGOGASTRODUODENOSCOPY (EGD) WITH PROPOFOL: SHX5813

## 2021-06-28 HISTORY — PX: BIOPSY: SHX5522

## 2021-06-28 LAB — CBC
HCT: 22.2 % — ABNORMAL LOW (ref 39.0–52.0)
HCT: 21.5 % — ABNORMAL LOW (ref 39.0–52.0)
HCT: 22.2 % — ABNORMAL LOW (ref 39.0–52.0)
Hemoglobin: 8 g/dL — ABNORMAL LOW (ref 13.0–17.0)
Hemoglobin: 7.3 g/dL — ABNORMAL LOW (ref 13.0–17.0)
Hemoglobin: 7.8 g/dL — ABNORMAL LOW (ref 13.0–17.0)
MCH: 30.7 pg (ref 26.0–34.0)
MCH: 29.1 pg (ref 26.0–34.0)
MCH: 30.1 pg (ref 26.0–34.0)
MCHC: 36 g/dL (ref 30.0–36.0)
MCHC: 34 g/dL (ref 30.0–36.0)
MCHC: 35.1 g/dL (ref 30.0–36.0)
MCV: 85.1 fL (ref 80.0–100.0)
MCV: 85.7 fL (ref 80.0–100.0)
MCV: 85.7 fL (ref 80.0–100.0)
Platelets: 221 10*3/uL (ref 150–400)
Platelets: 235 10*3/uL (ref 150–400)
Platelets: 232 10*3/uL (ref 150–400)
RBC: 2.61 MIL/uL — ABNORMAL LOW (ref 4.22–5.81)
RBC: 2.51 MIL/uL — ABNORMAL LOW (ref 4.22–5.81)
RBC: 2.59 MIL/uL — ABNORMAL LOW (ref 4.22–5.81)
RDW: 18.4 % — ABNORMAL HIGH (ref 11.5–15.5)
RDW: 18.7 % — ABNORMAL HIGH (ref 11.5–15.5)
RDW: 18.9 % — ABNORMAL HIGH (ref 11.5–15.5)
WBC: 10.9 10*3/uL — ABNORMAL HIGH (ref 4.0–10.5)
WBC: 10 10*3/uL (ref 4.0–10.5)
WBC: 9 10*3/uL (ref 4.0–10.5)
nRBC: 3.9 % — ABNORMAL HIGH (ref 0.0–0.2)
nRBC: 2.8 % — ABNORMAL HIGH (ref 0.0–0.2)
nRBC: 2.9 % — ABNORMAL HIGH (ref 0.0–0.2)

## 2021-06-28 LAB — HEPATITIS B SURFACE ANTIGEN: Hepatitis B Surface Ag: NONREACTIVE

## 2021-06-28 LAB — BASIC METABOLIC PANEL
Anion gap: 16 — ABNORMAL HIGH (ref 5–15)
BUN: 54 mg/dL — ABNORMAL HIGH (ref 8–23)
CO2: 23 mmol/L (ref 22–32)
Calcium: 7.7 mg/dL — ABNORMAL LOW (ref 8.9–10.3)
Chloride: 91 mmol/L — ABNORMAL LOW (ref 98–111)
Creatinine, Ser: 6.16 mg/dL — ABNORMAL HIGH (ref 0.61–1.24)
GFR, Estimated: 10 mL/min — ABNORMAL LOW (ref >60)
Glucose, Bld: 80 mg/dL (ref 70–99)
Potassium: 3.7 mmol/L (ref 3.5–5.1)
Sodium: 130 mmol/L — ABNORMAL LOW (ref 135–145)

## 2021-06-28 LAB — HEPATITIS B SURFACE ANTIBODY,QUALITATIVE: Hep B S Ab: REACTIVE — AB

## 2021-06-28 SURGERY — ESOPHAGOGASTRODUODENOSCOPY (EGD) WITH PROPOFOL
Anesthesia: Monitor Anesthesia Care

## 2021-06-28 MED ORDER — PHENYLEPHRINE 40 MCG/ML (10ML) SYRINGE FOR IV PUSH (FOR BLOOD PRESSURE SUPPORT)
PREFILLED_SYRINGE | INTRAVENOUS | Status: DC | PRN
  Administered 2021-06-28: 80 ug via INTRAVENOUS
  Administered 2021-06-28 (×2): 120 ug via INTRAVENOUS
  Administered 2021-06-28 (×2): 40 ug via INTRAVENOUS

## 2021-06-28 MED ORDER — ALBUMIN HUMAN 25 % IV SOLN
25.0000 g | Freq: Once | INTRAVENOUS | Status: AC
  Administered 2021-06-28: 25 g via INTRAVENOUS

## 2021-06-28 MED ORDER — LIDOCAINE-PRILOCAINE 2.5-2.5 % EX CREA
1.0000 "application " | TOPICAL_CREAM | CUTANEOUS | Status: DC | PRN
  Filled 2021-06-28: qty 5

## 2021-06-28 MED ORDER — MIDODRINE HCL 5 MG PO TABS
10.0000 mg | ORAL_TABLET | Freq: Once | ORAL | Status: AC
  Administered 2021-06-28: 10 mg via ORAL
  Filled 2021-06-28: qty 2

## 2021-06-28 MED ORDER — DARBEPOETIN ALFA 200 MCG/0.4ML IJ SOSY: 200.0000 ug | PREFILLED_SYRINGE | INTRAMUSCULAR | Status: DC

## 2021-06-28 MED ORDER — SODIUM CHLORIDE 0.9 % IV SOLN: 100.0000 mL | INTRAVENOUS | Status: DC | PRN

## 2021-06-28 MED ORDER — ALBUMIN HUMAN 25 % IV SOLN
INTRAVENOUS | Status: AC
  Filled 2021-06-28: qty 100

## 2021-06-28 MED ORDER — PANTOPRAZOLE SODIUM 40 MG PO TBEC
40.0000 mg | DELAYED_RELEASE_TABLET | Freq: Two times a day (BID) | ORAL | Status: DC
  Administered 2021-06-28 – 2021-06-29 (×2): 40 mg via ORAL
  Filled 2021-06-28 (×2): qty 1

## 2021-06-28 MED ORDER — PENTAFLUOROPROP-TETRAFLUOROETH EX AERO: 1.0000 "application " | INHALATION_SPRAY | CUTANEOUS | Status: DC | PRN

## 2021-06-28 MED ORDER — SUCRALFATE 1 GM/10ML PO SUSP
1.0000 g | Freq: Three times a day (TID) | ORAL | Status: DC
  Administered 2021-06-28 – 2021-06-29 (×5): 1 g via ORAL
  Filled 2021-06-28 (×5): qty 10

## 2021-06-28 MED ORDER — PROPOFOL 500 MG/50ML IV EMUL
INTRAVENOUS | Status: DC | PRN
  Administered 2021-06-28: 75 ug/kg/min via INTRAVENOUS

## 2021-06-28 MED ORDER — LIDOCAINE HCL (PF) 1 % IJ SOLN
5.0000 mL | INTRAMUSCULAR | Status: DC | PRN
  Filled 2021-06-28: qty 5

## 2021-06-28 NOTE — Progress Notes (Signed)
Pt receives out-pt HD at [facility] SW on TTS. Pt arrives at 5:30 for 5:45 chair time. Will assist as needed.  Melven Sartorius Renal Navigator [phone]

## 2021-06-28 NOTE — Interval H&P Note (Signed)
History and Physical Interval Note: For SBE today to eval GI bleeding. Finished HD today Responded to transfusions. No abd pain.  CBC Latest Ref Rng & Units 06/28/2021 06/27/2021 06/08/2021  WBC 4.0 - 10.5 K/uL 10.9(H) 14.0(H) 9.3  Hemoglobin 13.0 - 17.0 g/dL 8.0(L) 4.7(LL) 7.8(L)  Hematocrit 39.0 - 52.0 % 22.2(L) 13.6(L) 23.3(L)  Platelets 150 - 400 K/uL 221 254 265   The nature of the procedure, as well as the risks, benefits, and alternatives were carefully and thoroughly reviewed with the patient. Ample time for discussion and questions allowed. The patient understood, was satisfied, and agreed to proceed.    06/28/2021 3:14 PM  Patrick [name]  has presented today for surgery, with the diagnosis of Recurrent melena and anemia, history of multiple GI bleeds, AVMs, blood loss anemia..  The various methods of treatment have been discussed with the patient and family. After consideration of risks, benefits and other options for treatment, the patient has consented to  Procedure(s): ENTEROSCOPY (N/A) as a surgical intervention.  The patient's history has been reviewed, patient examined, no change in status, stable for surgery.  I have reviewed the patient's chart and labs.  Questions were answered to the patient's satisfaction.     [name]

## 2021-06-28 NOTE — Anesthesia Postprocedure Evaluation (Signed)
Anesthesia Post Note  Patient: Patrick [name]  Procedure(s) Performed: ESOPHAGOGASTRODUODENOSCOPY (EGD) WITH PROPOFOL HOT HEMOSTASIS (ARGON PLASMA COAGULATION/BICAP) BIOPSY     Patient location during evaluation: PACU Anesthesia Type: MAC Level of consciousness: awake and alert Pain management: pain level controlled Vital Signs Assessment: post-procedure vital signs reviewed and stable Respiratory status: spontaneous breathing, nonlabored ventilation and respiratory function stable Cardiovascular status: blood pressure returned to baseline and stable Postop Assessment: no apparent nausea or vomiting Anesthetic complications: no   No notable events documented.  Last Vitals:  Vitals:   06/28/21 1422 06/28/21 1558  BP: (!) 88/54 (!) 79/52  Pulse: 76 80  Resp:  15  Temp: 36.7 C 36.5 C  SpO2: 100% 100%    Last Pain:  Vitals:   06/28/21 1558  TempSrc: Temporal  PainSc: [name]

## 2021-06-28 NOTE — Progress Notes (Signed)
PROGRESS NOTE   SISTO GRANILLO  HAL:[id number]    DOB: 28-Aug-1955    DOA: 06/27/2021  PCP: Dorena Dew, FNP   I have briefly reviewed patients previous medical records in [facility].  Chief Complaint  Patient presents with   Hypotension    Brief Narrative:  66 year old male with medical history significant for visual impairment, sickle cell anemia, A. fib on Eliquis, ESRD on TTS HD, recent hospitalization in [place], [place] for choledocholithiasis with ERCP and sphincterotomy (declined cholecystectomy) and then readmitted at [facility] from 05/19/2021 - 06/09/2021 for SBO and gangrenous cholecystitis.  He underwent diagnostic laparotomy, small bowel resection, and open cholecystectomy.  His postop course was complicated by prolonged postop ileus and GI bleeding for which he underwent EGD that showed bleeding AVM in the stomach which were clipped.  He now presented with complaints of weakness and hypotension, sent from HD.  BP 80/52 and hemoglobin 4.7 in ED along with melena.  GI was consulted and s/p EGD 1/11.   Assessment & Plan:  Principal Problem:   Upper GI bleeding Active Problems:   Essential hypertension   Hb-SS disease without crisis ([place])   End stage renal disease ([place])   Permanent atrial fibrillation (HCC)   Chronic systolic CHF (congestive heart failure) (HCC)   Prolonged QT interval   H/O major abdominal surgery   Visual impairment   COPD not affecting current episode of care [facility])   Acute upper GI bleeding, recurrent: Had GI bleed during his most recent admission.  Underwent EGD that showed bleeding AVM in the stomach and clips were placed.  He was transitioned to twice daily PPI and Eliquis was resumed on 12/20.  He now presented with melena and symptomatic acute blood loss anemia.  CT abdomen and pelvis: No acute findings.  Treated with bowel rest, IV fluids, twice daily IV PPI.  [facility] was consulted.  S/p EGD 1/11 that showed Candida esophagitis, LA  grade B reflux esophagitis and gastritis with hemorrhage that was treated with APC.  Postprocedure, renal diet resumed, continue twice daily PPI, added sucralfate 1 g 3 times daily AC.  No NSAIDs.  May not be even able to resume Eliquis anytime soon.  Acute blood loss anemia complicating chronic anemia: Hemoglobin dropped from 7.8-4.9.  S/p 3 units PRBC transfusion and hemoglobin appropriately up to 8 g per DL.  Follow CBC now and in AM.  Transfuse if hemoglobin 7 g or less.  Chronic anemia may be multifactorial related to slow GI bleed, ESRD, recent acute illness including surgery and sickle cell anemia.  H/O major abdominal surgery -Patient with recent significant GI surgery including choledocholithiasis and then gangrenous cholecystitis -He appears to be doing well from this standpoint -Wound care for his incision still is an issue, needs daily dressing changes   Prolonged QT interval- (present on admission) -Will attempt to avoid QT-prolonging medications such as nausea meds, SSRIs; unable to avoid PPI -EKG 1/11, QTC 486 ms down from 507 ms on 1/10.   Permanent atrial fibrillation ([place])- (present on admission) -Patient was on and off amiodarone for rate control during last hospitalization -He was discharged on [place] for now given hypotension on presentation and ongoing soft blood pressures. -He has been holding Eliquis for the last few days, will continue to hold especially given recent GI bleed, EGD findings with diffuse gastritis and hemorrhage.   End stage renal disease ([place])- (present on admission) -Patient on chronic TTS HD -Nephrology input appreciated.  Seen at HD on 1/11   Hb-SS disease without crisis ([place])- (present on admission) -This may complicate transfusions -No evidence of crisis at this time   COPD not affecting current episode of care [facility])- (present on admission) -Quit smoking remotely -Appears comfortable -No longer using Breztri -Continue albuterol  prn   Visual impairment -Legally blind   Chronic systolic CHF (congestive heart failure) ([place])- (present on admission) -Recent echo with EF 40-45% valvular disease, pulmonary HTN -Volume controlled with HD.  Clinically euvolemic on the outside but has moderate to large volume ascites and small bilateral pleural effusions on CT abdomen.  Essential hypertension- (present on admission) -Discharged on Coreg -Was hypotensive on presentation -Hold Coreg  Body mass index is 20.02 kg/m.    DVT prophylaxis: SCDs Start: 06/27/21 1144     Code Status: DNR Family Communication: None at bedside. Disposition:  Status is: Inpatient  Remains inpatient appropriate because: Severity of illness.        Consultants:   [facility]  Procedures:   Hemodialysis EGD 1/11:  Impression: - Esophageal plaques were found, consistent with candidiasis. - LA Grade B reflux esophagitis with no bleeding. - Non-obstructing Schatzki ring. - 3 cm hiatal hernia. - Gastritis with hemorrhage. Focally, treated with argon plasma coagulation (APC). Gastritis is too diffuse for targeted ablation. Biopsied to exclude H. Pylori. - Previously placed endoclips were found in the stomach. - Normal examined duodenum.  Recommendation: Advance diet as tolerated. - Continue present medications. - Continue BID PPI. Add tablet sucralfate 1 g TID-AC. - No aspirin, ibuprofen, naproxen, or other non-steroidal anti-inflammatory drugs. - Await pathology results. - Closely monitor Hgb with hemodialysis. Replace iron IV when needed.  Antimicrobials:   None   Subjective:  Seen this morning while on dialysis, prior to EGD.  Denied complaints.  No dizziness, lightheadedness, dyspnea, chest pain or abdominal pain.  Reported that he had a BM last night but the nurses flushed it and he does not know the color.  Objective:   Vitals:   06/28/21 1558 06/28/21 1608 06/28/21 1619 06/28/21 1641  BP: (!) 79/52 (!)  89/58 (!) 84/57 91/68  Pulse: 80 71 87 72  Resp: 15 13 17 14   Temp: 97.7 F (36.5 C)   98 F (36.7 C)  TempSrc: Temporal   Oral  SpO2: 100% 100% 99% 98%  Weight:      Height:        General exam: [place] male, small built and frail, chronically ill looking lying comfortably propped up in bed undergoing hemodialysis. Respiratory system: Clear to auscultation. Respiratory effort normal. Cardiovascular system: S1 & S2 heard, RRR. No JVD, murmurs, rubs, gallops or clicks. No pedal edema.  Telemetry personally reviewed: A. fib with controlled ventricular rate. Gastrointestinal system: Abdomen is nondistended, soft and nontender. No organomegaly or masses felt. Normal bowel sounds heard.  Epigastric/mid abdominal dressing clean and dry. Central nervous system: Alert and oriented. No focal neurological deficits.  Legally blind. Extremities: Symmetric 5 x 5 power.  Right upper arm AV fistula. Skin: No rashes, lesions or ulcers Psychiatry: Judgement and insight appear normal. Mood & affect appropriate.     Data Reviewed:   I have personally reviewed following labs and imaging studies   CBC: Recent Labs  Lab 06/27/21 0830 06/28/21 0633  WBC 14.0* 10.9*  NEUTROABS 11.5*  --   HGB 4.7* 8.0*  HCT 13.6* 22.2*  MCV 86.6 85.1  PLT 254 259    Basic Metabolic Panel: Recent Labs  Lab  06/27/21 0830 06/28/21 0633  NA 134* 130*  K 3.6 3.7  CL 91* 91*  CO2 28 23  GLUCOSE 85 80  BUN 41* 54*  CREATININE 5.65* 6.16*  CALCIUM 8.1* 7.7*    Liver Function Tests: Recent Labs  Lab 06/27/21 0830  AST 23  ALT 14  ALKPHOS 563*  BILITOT 3.0*  PROT 6.1*  ALBUMIN <1.5*    CBG: No results for input(s): GLUCAP in the last 168 hours.  Microbiology Studies:   Recent Results (from the past 240 hour(s))  Culture, blood (routine x 2)     Status: None (Preliminary result)   Collection Time: 06/27/21  8:29 AM   Specimen: BLOOD LEFT ARM  Result Value Ref Range Status   Specimen  Description BLOOD LEFT ARM  Final   Special Requests   Final    BOTTLES DRAWN AEROBIC AND ANAEROBIC Blood Culture adequate volume   Culture   Final    NO GROWTH 1 DAY Performed at [facility], [address] [place], [place] [address]    Report Status PENDING  Incomplete  Resp Panel by RT-PCR (Flu A&B, Covid)     Status: None   Collection Time: 06/27/21  8:30 AM   Specimen: Nasopharyngeal(NP) swabs in vial transport medium  Result Value Ref Range Status   SARS Coronavirus 2 by RT PCR NEGATIVE NEGATIVE Final    Comment: (NOTE) SARS-CoV-2 target nucleic acids are NOT DETECTED.  The SARS-CoV-2 RNA is generally detectable in upper respiratory specimens during the acute phase of infection. The lowest concentration of SARS-CoV-2 viral copies this assay can detect is 138 copies/mL. A negative result does not preclude SARS-Cov-2 infection and should not be used as the sole basis for treatment or other patient management decisions. A negative result may occur with  improper specimen collection/handling, submission of specimen other than nasopharyngeal swab, presence of viral mutation(s) within the areas targeted by this assay, and inadequate number of viral copies(<138 copies/mL). A negative result must be combined with clinical observations, patient history, and epidemiological information. The expected result is Negative.  Fact Sheet for Patients:  EntrepreneurPulse.com.au  Fact Sheet for Healthcare Providers:  IncredibleEmployment.be  This test is no t yet approved or cleared by the [place] FDA and  has been authorized for detection and/or diagnosis of SARS-CoV-2 by FDA under an Emergency Use Authorization (EUA). This EUA will remain  in effect (meaning this test can be used) for the duration of the COVID-19 declaration under Section 564(b)(1) of the Act, 21 U.S.C.section 360bbb-3(b)(1), unless the authorization is terminated  or  revoked sooner.       Influenza A by PCR NEGATIVE NEGATIVE Final   Influenza B by PCR NEGATIVE NEGATIVE Final    Comment: (NOTE) The Xpert Xpress SARS-CoV-2/FLU/RSV plus assay is intended as an aid in the diagnosis of influenza from Nasopharyngeal swab specimens and should not be used as a sole basis for treatment. Nasal washings and aspirates are unacceptable for Xpert Xpress SARS-CoV-2/FLU/RSV testing.  Fact Sheet for Patients: EntrepreneurPulse.com.au  Fact Sheet for Healthcare Providers: IncredibleEmployment.be  This test is not yet approved or cleared by the [place] FDA and has been authorized for detection and/or diagnosis of SARS-CoV-2 by FDA under an Emergency Use Authorization (EUA). This EUA will remain in effect (meaning this test can be used) for the duration of the COVID-19 declaration under Section 564(b)(1) of the Act, 21 U.S.C. section 360bbb-3(b)(1), unless the authorization is terminated or revoked.  Performed at [facility], [place] [address], [place], [place] [address]   Culture, blood (routine x 2)     Status: None (Preliminary result)   Collection Time: 06/27/21  8:35 AM   Specimen: BLOOD LEFT WRIST  Result Value Ref Range Status   Specimen Description BLOOD LEFT WRIST  Final   Special Requests   Final    BOTTLES DRAWN AEROBIC AND ANAEROBIC Blood Culture adequate volume   Culture   Final    NO GROWTH 1 DAY Performed at [facility], [place] [address] [place], [place] [address]    Report Status PENDING  Incomplete    Radiology Studies:  CT ABDOMEN PELVIS W CONTRAST  Result Date: 06/27/2021 CLINICAL DATA:  Abdominal pain EXAM: CT ABDOMEN AND PELVIS WITH CONTRAST TECHNIQUE: Multidetector CT imaging of the abdomen and pelvis was performed using the standard protocol following bolus administration of intravenous contrast. CONTRAST:  92mL OMNIPAQUE IOHEXOL 300 MG/ML  SOLN COMPARISON:  CT abdomen/pelvis  05/29/2021 FINDINGS: Lower chest: There are small bilateral pleural effusions. The heart is markedly enlarged. There is no pericardial effusion to the level imaged. A calcification in the left ventricle is again noted. Hepatobiliary: No focal liver lesions are seen. The gallbladder is surgically absent. There is no biliary ductal dilatation. The previously seen biliary stent has been removed. Pancreas: Unremarkable. The previously seen pancreatic stent has been removed. Spleen: The spleen is atrophic with coarse calcifications, unchanged. Adrenals/Urinary Tract: The adrenals are unremarkable. The kidneys are atrophic bilaterally. Multiple cysts are noted bilaterally. There are no stones. There is no hydronephrosis or hydroureter. Bladder is decompressed, likely accounting for the wall thickness. Stomach/Bowel: A linear metallic density foreign body is noted in the stomach likely reflecting a vascular clip. The stomach is otherwise unremarkable. Postsurgical changes are noted in the right lower quadrant consistent with bowel resection. There is no evidence of mechanical small bowel obstruction. There is no definite abnormal bowel wall thickening or inflammatory change. The appendix is normal. Vascular/Lymphatic: There is scattered calcified atherosclerotic plaque throughout the nonaneurysmal abdominal aorta. The major branch vessels are patent proximally. The main portal vein is patent. There is no abdominal or pelvic lymphadenopathy. Reproductive: The prostate and seminal vesicles are unremarkable. Other: There is moderate-to-large volume ascites, increased since 05/29/2021. There is no organized or drainable fluid collection. There is no free intraperitoneal air. Musculoskeletal: Diffuse sclerosis throughout the bones is again favored to reflect sequela of sickle cell and/or renal disease. IMPRESSION: 1. Moderate-to-large volume ascites, increased since 05/29/2021. 2. Small bilateral pleural effusions. 3.  Postsurgical changes reflecting partial small bowel resection without evidence of obstruction. Electronically Signed   By: Valetta Mole M.D.   On: 06/27/2021 09:43   DG Chest Port 1 View  Result Date: 06/27/2021 CLINICAL DATA:  66 year old male with history of weakness and hypotension. EXAM: PORTABLE CHEST 1 VIEW COMPARISON:  Chest x-ray 05/28/2021. FINDINGS: Lung volumes are low. No consolidative airspace disease. No pleural effusions. No pneumothorax. No pulmonary nodule or mass noted. Pulmonary vasculature and the cardiomediastinal silhouette are within normal limits. Atherosclerotic calcifications in the thoracic aorta. IMPRESSION: 1. Low lung volumes without radiographic evidence of acute cardiopulmonary disease. 2. Aortic atherosclerosis. Electronically Signed   By: Vinnie Langton M.D.   On: 06/27/2021 07:55    Scheduled Meds:    allopurinol  100 mg Oral Daily   Chlorhexidine Gluconate Cloth  6 each Topical Q0600   [START ON 06/29/2021] cinacalcet  30 mg Oral Q T,Th,Sa-HD   [START ON 07/01/2021]  darbepoetin (ARANESP) injection - DIALYSIS  200 mcg Intravenous Q Sat-HD   [START ON 06/29/2021] doxercalciferol  9 mcg Intravenous Q T,Th,Sa-HD   ferric citrate  420 mg Oral TID   hydroxyurea  500 mg Oral Daily   pantoprazole  40 mg Oral BID AC   sodium chloride flush  3 mL Intravenous Q12H   sucralfate  1 g Oral TID WC & HS    Continuous Infusions:    sodium chloride Stopped (06/27/21 0948)   albumin human     [START ON 06/29/2021] ferric gluconate (FERRLECIT) IVPB     lactated ringers 10 mL/hr at 06/28/21 1521     LOS: 0 days     Vernell Leep, MD,  FACP, [facility], [facility], [facility] (Care Management Physician Certified) [place]  To contact the attending provider between 7A-7P or the covering provider during after hours 7P-7A, please log into the web site [email or web address] and access using universal [place] password for that web site. If you do not  have the password, please call the hospital operator.  06/28/2021, 4:53 PM

## 2021-06-28 NOTE — Anesthesia Preprocedure Evaluation (Signed)
Anesthesia Evaluation    Reviewed: Allergy & Precautions, H&P , Patient's Chart, lab work & pertinent test results  Airway Mallampati: II   Neck ROM: full    Dental  (+) Dental Advisory Given, Poor Dentition   Pulmonary shortness of breath and with exertion, former smoker,    breath sounds clear to auscultation       Cardiovascular hypertension, Pt. on medications +CHF (lvef 40-45%)   Rhythm:regular Rate:Normal  EF 40-45%. Severe MR, severe TR. LA and RA severely dilated.   Neuro/Psych  Headaches, negative psych ROS   GI/Hepatic Recurrent GIB, AVMs   Endo/Other    Renal/GU ESRF and DialysisRenal disease  negative genitourinary   Musculoskeletal negative musculoskeletal ROS (+)   Abdominal   Peds  Hematology  (+) Blood dyscrasia, Sickle cell anemia and anemia , Hb 8   Anesthesia Other Findings Legally blind  Reproductive/Obstetrics negative OB ROS                             Anesthesia Physical Anesthesia Plan  ASA: 3  Anesthesia Plan: MAC   Post-op Pain Management:    Induction:   PONV Risk Score and Plan: 2 and Propofol infusion and TIVA  Airway Management Planned: Natural Airway and Simple Face Mask  Additional Equipment: None  Intra-op Plan:   Post-operative Plan:   Informed Consent:   Plan Discussed with:   Anesthesia Plan Comments:         Anesthesia Quick Evaluation

## 2021-06-28 NOTE — Plan of Care (Signed)

## 2021-06-28 NOTE — Progress Notes (Signed)
HOSPITAL MEDICINE OVERNIGHT EVENT NOTE    Hemoglobin at 11 PM has resulted at 7.8, and increased from 7.3 earlier in the evening.  No additional transfusion needed at this time.  Vernelle Emerald  MD [facility]

## 2021-06-28 NOTE — Op Note (Signed)
[facility] Patient Name: Patrick [name] Procedure Date : 06/28/2021 MRN: [id number] Attending MD: Jerene Bears , MD Date of Birth: 22-Jun-1955 CSN: [id number] Age: 66 Admit Type: Inpatient Procedure:                Upper GI endoscopy Indications:              Iron deficiency anemia secondary to chronic blood                            loss, Melena Providers:                Lajuan Lines. Hilarie Fredrickson, MD, Brooke Person, Frazier Richards,                            Technician Referring MD:             [facility] Medicines:                Monitored Anesthesia Care Complications:            No immediate complications. Estimated Blood Loss:     Estimated blood loss was minimal. Procedure:                Pre-Anesthesia Assessment:                           - Prior to the procedure, a History and Physical                            was performed, and patient medications and                            allergies were reviewed. The patient's tolerance of                            previous anesthesia was also reviewed. The risks                            and benefits of the procedure and the sedation                            options and risks were discussed with the patient.                            All questions were answered, and informed consent                            was obtained. Prior Anticoagulants: The patient has                            taken no previous anticoagulant or antiplatelet                            agents. ASA Grade Assessment: IV - A patient with  severe systemic disease that is a constant threat                            to life. After reviewing the risks and benefits,                            the patient was deemed in satisfactory condition to                            undergo the procedure.                           After obtaining informed consent, the endoscope was                            passed under direct  vision. Throughout the                            procedure, the patient's blood pressure, pulse, and                            oxygen saturations were monitored continuously. The                            upper GI endoscopy was accomplished without                            difficulty. The PCF-H190TL ([id number] Olympus slim                            colonoscope was introduced through the mouth and                            advanced to the fourth part of duodenum. After                            obtaining informed consent, the endoscope was                            passed under direct vision. Throughout the                            procedure, the patient's blood pressure, pulse, and                            oxygen saturations were monitored continuously. The                            upper GI endoscopy was accomplished without                            difficulty. The patient tolerated the procedure  well. Scope In: Scope Out: Findings:      In the proximal esophagus there was a small inlet patch without       nodularity or visible abnormality.      Patchy, yellow plaques were found in the middle third of the esophagus       and in the lower third of the esophagus. This is consistent with mild       candidiasis.      LA Grade B (one or more mucosal breaks greater than 5 mm, not extending       between the tops of two mucosal folds) esophagitis with no bleeding was       found at the gastroesophageal junction.      A non-obstructing Schatzki ring was found at the gastroesophageal       junction.      A 3 cm hiatal hernia was present.      Diffuse moderate inflammation with hemorrhage (more evidence of       irrigation and contact) characterized by adherent blood, friability and       granularity was found in the cardia, in the gastric fundus and in the       gastric body. An actively oozing site in the gastric cardia was treated       with  fulguration to stop the bleeding by argon plasma at 0.5       liters/minute and 30 watts was successful. Biopsies were taken with a       cold forceps for histology and Helicobacter pylori testing.      Endoclips x 2 were found in the gastric body. There was no bleeding from       this site.      The examined duodenum was normal. Impression:               - Esophageal plaques were found, consistent with                            candidiasis.                           - LA Grade B reflux esophagitis with no bleeding.                           - Non-obstructing Schatzki ring.                           - 3 cm hiatal hernia.                           - Gastritis with hemorrhage. Focally, treated with                            argon plasma coagulation (APC). Gastritis is too                            diffuse for targeted ablation. Biopsied to exclude                            H. Pylori.                           -  Previously placed endoclips were found in the                            stomach.                           - Normal examined duodenum. Moderate Sedation:      N/A Recommendation:           - Return patient to hospital ward for ongoing care.                           - Advance diet as tolerated.                           - Continue present medications.                           - Continue BID PPI. Add tablet sucralfate 1 g                            TID-AC.                           - No aspirin, ibuprofen, naproxen, or other                            non-steroidal anti-inflammatory drugs.                           - Await pathology results.                           - Closely monitor Hgb with hemodialysis. Replace                            iron IV when needed. Procedure Code(s):        --- Professional ---                           29476, 24, Esophagogastroduodenoscopy, flexible,                            transoral; with control of bleeding, any method                            43239, Esophagogastroduodenoscopy, flexible,                            transoral; with biopsy, single or multiple Diagnosis Code(s):        --- Professional ---                           K22.9, Disease of esophagus, unspecified                           K21.00, Gastro-esophageal reflux disease with  esophagitis, without bleeding                           K22.2, Esophageal obstruction                           K44.9, Diaphragmatic hernia without obstruction or                            gangrene                           K29.71, Gastritis, unspecified, with bleeding                           D50.0, Iron deficiency anemia secondary to blood                            loss (chronic)                           K92.1, Melena (includes Hematochezia) CPT copyright 2019 [employer]. All rights reserved. The codes documented in this report are preliminary and upon coder review may  be revised to meet current compliance requirements. Jerene Bears, MD 06/28/2021 4:34:55 PM This report has been signed electronically. Number of Addenda: 0

## 2021-06-28 NOTE — Progress Notes (Signed)
[facility] Progress Note   Subjective: Seen prior to HD. SBP 80s. Given Albumin 25 Gram prior to starting HD. Run even, NS as needed to keep SBP >90  Objective Vitals:   06/28/21 0900 06/28/21 0930 06/28/21 0945 06/28/21 1000  BP: (!) 90/48 (!) 86/84 94/60 (!) 86/43  Pulse: 78 75 80 87  Resp:      Temp:      TempSrc:      SpO2:      Weight:      Height:       Physical Exam General: Chronically ill appearing male in acute distress. Neuro: Oriented X 3. A bit lethargic.  Heart: C6,C3 2/6 systolic M. No R/G. SR on monitor. JVD present from clavicles to mandible.  Lungs: CTAB Abdomen: Mid line incision with drsg intact. NABS.  Extremities:No LE edema Dialysis Access: R AVF cannulated.    Additional Objective Labs: Basic Metabolic Panel: Recent Labs  Lab 06/27/21 0830 06/28/21 0633  NA 134* 130*  K 3.6 3.7  CL 91* 91*  CO2 28 23  GLUCOSE 85 80  BUN 41* 54*  CREATININE 5.65* 6.16*  CALCIUM 8.1* 7.7*   Liver Function Tests: Recent Labs  Lab 06/27/21 0830  AST 23  ALT 14  ALKPHOS 563*  BILITOT 3.0*  PROT 6.1*  ALBUMIN <1.5*   Recent Labs  Lab 06/27/21 0830  LIPASE 30   CBC: Recent Labs  Lab 06/27/21 0830 06/28/21 0633  WBC 14.0* 10.9*  NEUTROABS 11.5*  --   HGB 4.7* 8.0*  HCT 13.6* 22.2*  MCV 86.6 85.1  PLT 254 221   Blood Culture    Component Value Date/Time   SDES URINE, RANDOM 06/02/2018 1709   SPECREQUEST NONE 06/02/2018 1709   CULT  06/02/2018 1709    NO GROWTH Performed at [facility], [place] [address] [place], [place] [address]    REPTSTATUS 06/04/2018 FINAL 06/02/2018 1709    Cardiac Enzymes: No results for input(s): CKTOTAL, CKMB, CKMBINDEX, TROPONINI in the last 168 hours. CBG: No results for input(s): GLUCAP in the last 168 hours. Iron Studies: No results for input(s): IRON, TIBC, TRANSFERRIN, FERRITIN in the last 72 hours. @lablastinr3 @ Studies/Results: CT ABDOMEN PELVIS W CONTRAST  Result Date:  06/27/2021 CLINICAL DATA:  Abdominal pain EXAM: CT ABDOMEN AND PELVIS WITH CONTRAST TECHNIQUE: Multidetector CT imaging of the abdomen and pelvis was performed using the standard protocol following bolus administration of intravenous contrast. CONTRAST:  16mL OMNIPAQUE IOHEXOL 300 MG/ML  SOLN COMPARISON:  CT abdomen/pelvis 05/29/2021 FINDINGS: Lower chest: There are small bilateral pleural effusions. The heart is markedly enlarged. There is no pericardial effusion to the level imaged. A calcification in the left ventricle is again noted. Hepatobiliary: No focal liver lesions are seen. The gallbladder is surgically absent. There is no biliary ductal dilatation. The previously seen biliary stent has been removed. Pancreas: Unremarkable. The previously seen pancreatic stent has been removed. Spleen: The spleen is atrophic with coarse calcifications, unchanged. Adrenals/Urinary Tract: The adrenals are unremarkable. The kidneys are atrophic bilaterally. Multiple cysts are noted bilaterally. There are no stones. There is no hydronephrosis or hydroureter. Bladder is decompressed, likely accounting for the wall thickness. Stomach/Bowel: A linear metallic density foreign body is noted in the stomach likely reflecting a vascular clip. The stomach is otherwise unremarkable. Postsurgical changes are noted in the right lower quadrant consistent with bowel resection. There is no evidence of mechanical small bowel obstruction. There is no definite abnormal bowel wall thickening or  inflammatory change. The appendix is normal. Vascular/Lymphatic: There is scattered calcified atherosclerotic plaque throughout the nonaneurysmal abdominal aorta. The major branch vessels are patent proximally. The main portal vein is patent. There is no abdominal or pelvic lymphadenopathy. Reproductive: The prostate and seminal vesicles are unremarkable. Other: There is moderate-to-large volume ascites, increased since 05/29/2021. There is no organized  or drainable fluid collection. There is no free intraperitoneal air. Musculoskeletal: Diffuse sclerosis throughout the bones is again favored to reflect sequela of sickle cell and/or renal disease. IMPRESSION: 1. Moderate-to-large volume ascites, increased since 05/29/2021. 2. Small bilateral pleural effusions. 3. Postsurgical changes reflecting partial small bowel resection without evidence of obstruction. Electronically Signed   By: Valetta Mole M.D.   On: 06/27/2021 09:43   DG Chest Port 1 View  Result Date: 06/27/2021 CLINICAL DATA:  66 year old male with history of weakness and hypotension. EXAM: PORTABLE CHEST 1 VIEW COMPARISON:  Chest x-ray 05/28/2021. FINDINGS: Lung volumes are low. No consolidative airspace disease. No pleural effusions. No pneumothorax. No pulmonary nodule or mass noted. Pulmonary vasculature and the cardiomediastinal silhouette are within normal limits. Atherosclerotic calcifications in the thoracic aorta. IMPRESSION: 1. Low lung volumes without radiographic evidence of acute cardiopulmonary disease. 2. Aortic atherosclerosis. Electronically Signed   By: Vinnie Langton M.D.   On: 06/27/2021 07:55   Medications:  sodium chloride Stopped (06/27/21 0948)   sodium chloride     sodium chloride     albumin human     [START ON 06/29/2021] ferric gluconate (FERRLECIT) IVPB     lactated ringers Stopped (06/27/21 1148)    allopurinol  100 mg Oral Daily   Chlorhexidine Gluconate Cloth  6 each Topical Q0600   [START ON 06/29/2021] cinacalcet  30 mg Oral Q T,Th,Sa-HD   [START ON 06/29/2021] doxercalciferol  9 mcg Intravenous Q T,Th,Sa-HD   ferric citrate  420 mg Oral TID   hydroxyurea  500 mg Oral Daily   pantoprazole (PROTONIX) IV  40 mg Intravenous Q12H   sodium chloride flush  3 mL Intravenous Q12H     Dialysis Orders: Center: [facility] T,Th,S 4 hrs 180NRe 400/600 65.5 kg 2.0K/2.0 Ca UFP 2 AVF -No heparin -Hectorol 9 mcg IV TIW -Mircera 200 mcg IV q 2 weeks (last dose  06/17/2021 ESA due 07/01/2021) -Venofer 100 mg IV X 10 doses 2/10 doses have been given -Sensipar 30 mg PO TIW   Assessment/Plan:  UGI-seen by GI. HGB 4.7 on admission. HGB 8.0 after 3 units PRBCs 06/27/2021. Going for enteroscopy today. Per primary. Eliquis on hold.  Permanent AFIB. Eliquis on hold. Carvedilol on hold. Per primary  ESRD -  T,Th,S HD today off schedule. Continue MWF schedule until 06/30/2021 then can have short HD 07/01/2021 to resume T,Th,S schedule. Next HD 06/30/2021  Hypotension/volume  - hypotensive at present. No evidence of volume overload but has been getting slightly under EDW at OP center. SBP in 80s today. Given albumin, getting NS. Running even today.    Anemia  - See # 1. Continue Fe load with HD. ESA due 07/01/2021. ESA has been ordered.    Metabolic bone disease -  Continue binders, VDRA, sensipar when able to eat. Last OP labs at goal.   Nutrition - NPO H/O combined systolic/diastolic HF-follows with HF. Takes Torsemide 80 mg PO daily. Torsemide on hold for now but needs to resumed on discharge. Volume managed with HD.  H/O SSD  Shantavia Jha H. Quinn Quam NP-C 06/28/2021, 10:27 AM  [employer] [phone]

## 2021-06-28 NOTE — Anesthesia Procedure Notes (Signed)
Procedure Name: MAC Date/Time: 06/28/2021 3:30 PM Performed by: Griffin Dakin, CRNA Pre-anesthesia Checklist: Patient identified, Emergency Drugs available, Suction available, Patient being monitored and Timeout performed Patient Re-evaluated:Patient Re-evaluated prior to induction Oxygen Delivery Method: Simple face mask Induction Type: IV induction Placement Confirmation: positive ETCO2 and breath sounds checked- equal and bilateral Dental Injury: Teeth and Oropharynx as per pre-operative assessment

## 2021-06-28 NOTE — Transfer of Care (Signed)
Immediate Anesthesia Transfer of Care Note  Patient: Patrick [name]  Procedure(s) Performed: ESOPHAGOGASTRODUODENOSCOPY (EGD) WITH PROPOFOL HOT HEMOSTASIS (ARGON PLASMA COAGULATION/BICAP) BIOPSY  Patient Location: PACU  Anesthesia Type:MAC  Level of Consciousness: awake, alert  and oriented  Airway & Oxygen Therapy: Patient Spontanous Breathing and Patient connected to nasal cannula oxygen  Post-op Assessment: Report given to RN and Post -op Vital signs reviewed and stable  Post vital signs: Reviewed and stable  Last Vitals:  Vitals Value Taken Time  BP 79/52 06/28/21 1558  Temp    Pulse 82 06/28/21 1559  Resp 15 06/28/21 1559  SpO2 100 % 06/28/21 1559  Vitals shown include unvalidated device data.  Last Pain:  Vitals:   06/28/21 1422  TempSrc: Temporal  PainSc: 4       Patients Stated Pain Goal: 0 (54/49/20 1007)  Complications: No notable events documented.

## 2021-06-28 NOTE — Plan of Care (Signed)
°  Problem: Education: Goal: Knowledge of General Education information will improve Description: Including pain rating scale, medication(s)/side effects and non-pharmacologic comfort measures Outcome: Not Progressing   Problem: Health Behavior/Discharge Planning: Goal: Ability to manage health-related needs will improve Outcome: Not Progressing   Problem: Clinical Measurements: Goal: Ability to maintain clinical measurements within normal limits will improve Outcome: Not Progressing Goal: Will remain free from infection Outcome: Not Progressing Goal: Diagnostic test results will improve Outcome: Not Progressing Goal: Respiratory complications will improve Outcome: Not Progressing Goal: Cardiovascular complication will be avoided Outcome: Not Progressing   Problem: Activity: Goal: Risk for activity intolerance will decrease Outcome: Not Progressing   Problem: Nutrition: Goal: Adequate nutrition will be maintained Outcome: Not Progressing   Problem: Coping: Goal: Level of anxiety will decrease Outcome: Not Progressing   Problem: Elimination: Goal: Will not experience complications related to bowel motility Outcome: Not Progressing Goal: Will not experience complications related to urinary retention Outcome: Not Progressing   Problem: Skin Integrity: Goal: Risk for impaired skin integrity will decrease Outcome: Not Progressing   Problem: Education: Goal: Ability to identify signs and symptoms of gastrointestinal bleeding will improve Outcome: Not Progressing   Problem: Bowel/Gastric: Goal: Will show no signs and symptoms of gastrointestinal bleeding Outcome: Not Progressing

## 2021-06-29 ENCOUNTER — Other Ambulatory Visit (HOSPITAL_COMMUNITY): Payer: Self-pay

## 2021-06-29 ENCOUNTER — Encounter (HOSPITAL_COMMUNITY): Payer: Self-pay

## 2021-06-29 ENCOUNTER — Ambulatory Visit: Payer: HMO | Admitting: Family Medicine

## 2021-06-29 DIAGNOSIS — K2901 Acute gastritis with bleeding: Secondary | ICD-10-CM

## 2021-06-29 DIAGNOSIS — K922 Gastrointestinal hemorrhage, unspecified: Secondary | ICD-10-CM | POA: Diagnosis not present

## 2021-06-29 DIAGNOSIS — N186 End stage renal disease: Secondary | ICD-10-CM

## 2021-06-29 LAB — CBC
HCT: 21.5 % — ABNORMAL LOW (ref 39.0–52.0)
Hemoglobin: 7.5 g/dL — ABNORMAL LOW (ref 13.0–17.0)
MCH: 30 pg (ref 26.0–34.0)
MCHC: 34.9 g/dL (ref 30.0–36.0)
MCV: 86 fL (ref 80.0–100.0)
Platelets: 228 10*3/uL (ref 150–400)
RBC: 2.5 MIL/uL — ABNORMAL LOW (ref 4.22–5.81)
RDW: 19 % — ABNORMAL HIGH (ref 11.5–15.5)
WBC: 8.6 10*3/uL (ref 4.0–10.5)
nRBC: 2.8 % — ABNORMAL HIGH (ref 0.0–0.2)

## 2021-06-29 LAB — HEPATITIS B SURFACE ANTIBODY, QUANTITATIVE: Hep B S AB Quant (Post): 560 m[IU]/mL (ref >9.9)

## 2021-06-29 MED ORDER — NYSTATIN 100000 UNIT/ML MT SUSP
5.0000 mL | Freq: Four times a day (QID) | OROMUCOSAL | 0 refills | Status: AC
  Filled 2021-06-29: qty 200, 10d supply, fill #0

## 2021-06-29 MED ORDER — DARBEPOETIN ALFA 200 MCG/0.4ML IJ SOSY
200.0000 ug | PREFILLED_SYRINGE | INTRAMUSCULAR | Status: DC
  Administered 2021-06-29: 200 ug via INTRAVENOUS
  Filled 2021-06-29: qty 0.4

## 2021-06-29 MED ORDER — SUCRALFATE 1 G PO TABS
1.0000 g | ORAL_TABLET | Freq: Three times a day (TID) | ORAL | 0 refills | Status: DC
  Filled 2021-06-29: qty 28, 7d supply, fill #0

## 2021-06-29 MED ORDER — NYSTATIN 100000 UNIT/ML MT SUSP
5.0000 mL | Freq: Four times a day (QID) | OROMUCOSAL | Status: DC
  Administered 2021-06-29: 500000 [IU] via ORAL
  Filled 2021-06-29: qty 5

## 2021-06-29 MED ORDER — SUCRALFATE 1 GM/10ML PO SUSP
1.0000 g | Freq: Three times a day (TID) | ORAL | 0 refills | Status: DC
  Filled 2021-06-29: qty 280, 7d supply, fill #0

## 2021-06-29 MED ORDER — SUCRALFATE 1 G PO TABS
ORAL_TABLET | ORAL | 0 refills | Status: DC
  Filled 2021-06-29: qty 28, 7d supply, fill #0

## 2021-06-29 NOTE — Discharge Instructions (Signed)

## 2021-06-29 NOTE — Progress Notes (Signed)
D/C order noted. Contacted [facility] to make clinic aware of pt's d/c today and will resume care on Saturday.   Melven Sartorius Renal Navigator [phone]

## 2021-06-29 NOTE — Progress Notes (Addendum)
[facility] Progress Note   Subjective: Seen in room. Looks better today but BP still low. Will be discharged today. He will be seen by surgery and have short HD prior to discharge. Objective Vitals:   06/28/21 1619 06/28/21 1641 06/28/21 2143 06/29/21 0740  BP: (!) 84/57 91/68 (!) 87/62 95/62  Pulse: 87 72 67 84  Resp: 17 14 18 17   Temp:  98 F (36.7 C) 98.1 F (36.7 C) 98.8 F (37.1 C)  TempSrc:  Oral Oral Oral  SpO2: 99% 98% 96% 97%  Weight:      Height:       Physical Exam General: Pleasant chronically ill appearing male in NAD Neuro: Oriented X 3. Mental status at baseline Heart: P5,K9 2/6 systolic M. No R/G. SR on monitor. JVD present from clavicles to mandible.  Lungs: CTAB Abdomen: Mid line incision with drsg intact. NABS.  Extremities:No LE edema Dialysis Access: R AVF +T/B    Additional Objective Labs: Basic Metabolic Panel: Recent Labs  Lab 06/27/21 0830 06/28/21 0633  NA 134* 130*  K 3.6 3.7  CL 91* 91*  CO2 28 23  GLUCOSE 85 80  BUN 41* 54*  CREATININE 5.65* 6.16*  CALCIUM 8.1* 7.7*   Liver Function Tests: Recent Labs  Lab 06/27/21 0830  AST 23  ALT 14  ALKPHOS 563*  BILITOT 3.0*  PROT 6.1*  ALBUMIN <1.5*   Recent Labs  Lab 06/27/21 0830  LIPASE 30   CBC: Recent Labs  Lab 06/27/21 0830 06/28/21 0633 06/28/21 1707 06/28/21 2315 06/29/21 0329  WBC 14.0* 10.9* 10.0 9.0 8.6  NEUTROABS 11.5*  --   --   --   --   HGB 4.7* 8.0* 7.3* 7.8* 7.5*  HCT 13.6* 22.2* 21.5* 22.2* 21.5*  MCV 86.6 85.1 85.7 85.7 86.0  PLT 254 221 235 232 228   Blood Culture    Component Value Date/Time   SDES BLOOD LEFT WRIST 06/27/2021 0835   SPECREQUEST  06/27/2021 0835    BOTTLES DRAWN AEROBIC AND ANAEROBIC Blood Culture adequate volume   CULT  06/27/2021 0835    NO GROWTH 1 DAY Performed at [facility], [place] [address] [place], [place] [address]    REPTSTATUS PENDING 06/27/2021 2458    Cardiac Enzymes: No results for  input(s): CKTOTAL, CKMB, CKMBINDEX, TROPONINI in the last 168 hours. CBG: No results for input(s): GLUCAP in the last 168 hours. Iron Studies: No results for input(s): IRON, TIBC, TRANSFERRIN, FERRITIN in the last 72 hours. @lablastinr3 @ Studies/Results: No results found. Medications:  ferric gluconate (FERRLECIT) IVPB     lactated ringers Stopped (06/28/21 1900)    allopurinol  100 mg Oral Daily   Chlorhexidine Gluconate Cloth  6 each Topical Q0600   cinacalcet  30 mg Oral Q T,Th,Sa-HD   [START ON 07/01/2021] darbepoetin (ARANESP) injection - DIALYSIS  200 mcg Intravenous Q Sat-HD   doxercalciferol  9 mcg Intravenous Q T,Th,Sa-HD   ferric citrate  420 mg Oral TID   hydroxyurea  500 mg Oral Daily   pantoprazole  40 mg Oral BID AC   sodium chloride flush  3 mL Intravenous Q12H   sucralfate  1 g Oral TID WC & HS     Dialysis Orders: Center: [facility] T,Th,S 4 hrs 180NRe 400/600 65.5 kg 2.0K/2.0 Ca UFP 2 AVF -No heparin -Hectorol 9 mcg IV TIW -Mircera 200 mcg IV q 2 weeks (last dose 06/17/2021 ESA due 07/01/2021) -Venofer 100 mg IV X 10 doses 2/10  doses have been given -Sensipar 30 mg PO TIW   Assessment/Plan:  UGI-seen by GI. HGB 4.7 on admission. HGB 7.6 after 3 units PRBCs 06/27/2021. Went for enteroscopy 06/28/2021. Gastritis with hemorrhage treated with APC, Candida esophagitis, LA grade B reflux. Started on carafate. ESRD patients cannot take carafate longer than 2 weeks D/T risk of aluminum toxicity. Primary notified. Eliquis on hold. Would recommend stopping D/T recurrent GIB. Risk for harm outweighs benefit. Per primary/GI.  Permanent AFIB. Eliquis on hold. Carvedilol on hold. Per primary  ESRD -  T,Th,S  Next HD 06/29/2021 prior to DC.   Hypotension/volume  -BP soft today. 95/57.  No evidence of volume overload but has been getting slightly under EDW at OP center. Minimal UF with HD tomorrow. Please resume Torsemide on discharge, needs lower EDW on DC.   Anemia  - See # 1. He  is a Sickle Cell Pt.  Continue Fe load with HD. ESA due 07/01/2021. ESA has been ordered. He agrees to transfuse 1 unit PRBCs with HD tomorrow if HGB <4.9   Metabolic bone disease -  Continue binders, VDRA, sensipar when able to eat. Last OP labs at goal.   Nutrition -Renal diet. Albumin very low. Protein supps. Nepro. Renal vits.  H/O combined systolic/diastolic HF-follows with HF. Takes Torsemide 80 mg PO daily. Torsemide on hold for now but needs to resumed on discharge. Volume managed with HD.  H/O SSD  Archie Shea H. Maye Parkinson NP-C 06/29/2021, 11:43 AM  [employer] [phone]

## 2021-06-29 NOTE — Progress Notes (Signed)
Progress Note Hospital Day: 3  Chief Complaint:   GI bleed, recurrent anemia      ASSESSMENT AND PLAN   Brief history:  66 yo male with ESRD on HD TTS, anemia of chronic disease, sickle cell anemia, chronic systolic heart failure, chronic atrial fibrillation on Eliquis, ulcerative esophagitis , hemorrhagic gastritis, gastric AVMs requiring endoscopic intervention, small hiatal hernia, choledocholithiasis status post ERCP with sphincterotomy November 2022, s/p cholecystectomy and small bowel resection with primary anastomosis Dec 2022, Patient admitted on multiple occasions with acute on chronic anemia requiring blood transfusions.  He was admitted 06/27/2021 with hypotension, weakness,  melena and worsening anemia with hgb of 4.7.   # Upper GI bleed /recurrent acute on chronic anemia ( sickle cell anemia).  Acute bleed from hemorrhagic gastritis found on EGD 06/29/21. Treated with APC but lesions too diffuse for full treatment.  --Off Eliquis since 06/24/2018. Need to weigh benefits of Eliquis against risk of recurrent GI bleeding.  --Awaiting biopsies to rule out H.pylori.  --Continue BID PPI and Carafate --Hgb improved from 4.7 to 7.65 post 3 u PRBCs on 1/10 --From a GI standpoint he is okay for discharge.   # Reflux esophagitis / 3 cm HH --Continue PPI.  --Carafate ( ac and HS for one week).    # Probably mild esophageal candida, biopsies pending   DIAGNOSTIC STUDIES THIS ADMISSION:   06/29/21 EGD recurrent melena and anemia Esophageal plaques were found, consistent with candidiasis. - LA Grade B reflux esophagitis with no bleeding. - Non-obstructing Schatzki ring. - 3 cm hiatal hernia. - Gastritis with hemorrhage. Focally, treated with argon plasma coagulation (APC). Gastritis is too diffuse for targeted ablation. Biopsied to exclude H. Pylori. - Previously placed endoclips were found in the stomach. - Normal examined duodenum.  SUBJECTIVE   No complaints today.         OBJECTIVE      Scheduled inpatient medications:   allopurinol  100 mg Oral Daily   Chlorhexidine Gluconate Cloth  6 each Topical Q0600   cinacalcet  30 mg Oral Q T,Th,Sa-HD   [START ON 07/01/2021] darbepoetin (ARANESP) injection - DIALYSIS  200 mcg Intravenous Q Sat-HD   doxercalciferol  9 mcg Intravenous Q T,Th,Sa-HD   ferric citrate  420 mg Oral TID   hydroxyurea  500 mg Oral Daily   pantoprazole  40 mg Oral BID AC   sodium chloride flush  3 mL Intravenous Q12H   sucralfate  1 g Oral TID WC & HS   Continuous inpatient infusions:   ferric gluconate (FERRLECIT) IVPB     lactated ringers Stopped (06/28/21 1900)   PRN inpatient medications: acetaminophen **OR** acetaminophen, calcium carbonate (dosed in mg elemental calcium), camphor-menthol **AND** hydrOXYzine, docusate sodium, feeding supplement (NEPRO CARB STEADY), gabapentin, oxyCODONE, sorbitol, zolpidem  Vital signs in last 24 hours: Temp:  [97.7 F (36.5 C)-98.8 F (37.1 C)] 98.8 F (37.1 C) (01/12 0740) Pulse Rate:  [67-88] 84 (01/12 0740) Resp:  [13-18] 17 (01/12 0740) BP: (79-109)/(52-68) 95/62 (01/12 0740) SpO2:  [96 %-100 %] 97 % (01/12 0740) Weight:  [65.1 kg] 65.1 kg (01/11 1305) Last BM Date: 06/29/21  Intake/Output Summary (Last 24 hours) at 06/29/2021 1136 Last data filed at 06/28/2021 1842 Gross per 24 hour  Intake 150 ml  Output -500 ml  Net 650 ml     Physical Exam:  General: Alert male in NAD Heart:  Regular rate and rhythm. No lower extremity edema Pulmonary: Normal respiratory effort Abdomen: Soft, nondistended,  nontender. Normal bowel sounds. Clean, dry surgical dressing present Neurologic: Alert and oriented Psych: Pleasant. Cooperative.   Filed Weights   06/27/21 0734 06/28/21 0757 06/28/21 1305  Weight: 67.6 kg 64.8 kg 65.1 kg    Intake/Output from previous day: 01/11 0701 - 01/12 0700 In: 150 [P.O.:150] Out: -500  Intake/Output this shift: No intake/output data  recorded.    Lab Results: Recent Labs    06/28/21 1707 06/28/21 2315 06/29/21 0329  WBC 10.0 9.0 8.6  HGB 7.3* 7.8* 7.5*  HCT 21.5* 22.2* 21.5*  PLT 235 232 228   BMET Recent Labs    06/27/21 0830 06/28/21 0633  NA 134* 130*  K 3.6 3.7  CL 91* 91*  CO2 28 23  GLUCOSE 85 80  BUN 41* 54*  CREATININE 5.65* 6.16*  CALCIUM 8.1* 7.7*   LFT Recent Labs    06/27/21 0830  PROT 6.1*  ALBUMIN <1.5*  AST 23  ALT 14  ALKPHOS 563*  BILITOT 3.0*   PT/INR Recent Labs    06/27/21 0830  LABPROT 21.4*  INR 1.9*   Hepatitis Panel Recent Labs    06/28/21 0633  HEPBSAG NON REACTIVE    No results found.   Principal Problem:   Upper GI bleeding Active Problems:   Essential hypertension   Hb-SS disease without crisis ([facility])   End stage renal disease ([facility])   Permanent atrial fibrillation ([facility])   Chronic systolic CHF (congestive heart failure) ([facility])   Prolonged QT interval   H/O major abdominal surgery   Visual impairment   COPD not affecting current episode of care ([facility])     LOS: 1 day   Tye Savoy ,NP 06/29/2021, 11:36 AM

## 2021-06-29 NOTE — Care Management (Signed)
Pt informed of the importance of maintaining IV access prior to physically leaving facility. Insists IV be removed. No remaining access.

## 2021-06-29 NOTE — Discharge Summary (Addendum)
Physician Discharge Summary  Patrick [name]:[id number] DOB: 02-Feb-1956  PCP: Dorena Dew, FNP  Admitted from: Home Discharged to: Home  Admit date: 06/27/2021 Discharge date: 06/29/2021  Recommendations for Outpatient Follow-up:    Follow-up Information     Willia Craze, NP Follow up on 07/19/2021.   Specialty: Gastroenterology Why: at 2:30 pm. Please arrive 10 minutes early for appointment Contact information: [place] [address] [phone]         Dorena Dew, FNP. Schedule an appointment as soon as possible for a visit in 1 week(s).   Specialty: Family Medicine Why: Kindly follow-up on lab results (CBC & renal panel) which can be drawn across his usual hemodialysis Contact information: [address] [place] [address] [phone]         Hemodialysis center Follow up on 07/01/2021.   Why: Keep up your scheduled appointments on Tuesdays, Thursdays and Saturdays.        Rolm Bookbinder, MD. Daphane Shepherd on 07/21/2021.   Specialty: General Surgery Why: Your appointment is 07/21/21 at 11am Please arrive 30 minutes prior to your appointment to check in and fill out paperwork. Bring photo ID and insurance information. Contact information: [place] [address] [phone]         Bensimhon, Shaune Pascal, MD. Schedule an appointment as soon as possible for a visit.   Specialty: Cardiology Contact information: [address] [place] [address] [place]: None    Equipment/Devices: None    Discharge Condition: Improved and stable   Code Status: DNR Diet recommendation:  Discharge Diet Orders (From admission, onward)     Start     Ordered   06/29/21 0000  Diet - low sodium heart healthy        06/29/21 1412             Discharge Diagnoses:  Principal Problem:   Upper GI bleeding Active Problems:   Essential  hypertension   Hb-SS disease without crisis ([place])   End stage renal disease ([place])   Permanent atrial fibrillation (HCC)   Chronic systolic CHF (congestive heart failure) (HCC)   Prolonged QT interval   H/O major abdominal surgery   Visual impairment   COPD not affecting current episode of care [facility])   Brief Summary: 66 year old male with medical history significant for visual impairment, sickle cell anemia, A. fib on Eliquis, ESRD on TTS HD, [place] choledocholithiasis with ERCP and sphincterotomy (declined cholecystectomy) and then readmitted at [facility] health from 05/19/2021 - 06/09/2021 for SBO and gangrenous cholecystitis.  He underwent diagnostic laparotomy, small bowel resection, and open cholecystectomy.  His postop course was complicated by prolonged postop ileus and GI bleeding for which he underwent EGD that showed bleeding AVM in the stomach which were clipped.  He now presented with complaints of weakness and hypotension, sent from HD.  BP 80/52 and hemoglobin 4.7 in ED along with melena.  GI was consulted and s/p EGD 1/11.     Assessment & Plan:   Acute upper GI bleeding, recurrent/hemorrhagic gastritis: Had GI bleed during his most recent admission.  Underwent EGD that showed bleeding AVM in the stomach and clips were placed.  He was transitioned to twice daily PPI and Eliquis was resumed on 12/20.  He now presented with melena  and symptomatic acute blood loss anemia.  CT abdomen and pelvis: No acute findings.  Treated with bowel rest, IV fluids, twice daily IV PPI.  [facility] was consulted.  S/p EGD 1/11 that showed Candida esophagitis, LA grade B reflux esophagitis and gastritis with hemorrhage that was treated with APC.  Postprocedure, renal diet resumed, continue twice daily PPI, added sucralfate 1 g 3 times daily AC.  No NSAIDs.    No further report of GI bleed.  [facility] follow-up appreciated.  EGD showed hemorrhagic gastritis and pathology is  pending.  After discussing with GI, cardiology and nephrology, risks of continuing Eliquis including recurrent life-threatening GI bleed outweigh benefits of preventing stroke and hence decision was made to discontinue Eliquis at this time.  This can be reassessed in the future.  Continue twice daily PPI and limited Carafate x1 week given ESRD and risk for toxicity.  GI will follow up on pathology and if he has H. pylori, they will call in appropriate treatment.  GI has cleared patient for discharge.  Candida esophagitis: As discussed with Dr. Hilarie Fredrickson, initiated nystatin suspension 500,000 units 4 times daily x10 days.  Avoiding Diflucan due to long QT issues.  Acute blood loss anemia complicating chronic anemia: Hemoglobin dropped from 7.8-4.9.  S/p 3 units PRBC transfusion and hemoglobin appropriately up to 8 g per DL.  Chronic anemia may be multifactorial related to slow GI bleed, ESRD, recent acute illness including surgery and sickle cell anemia.  Posttransfusion hemoglobin has been stable in the 7 g range for the last 3 readings.  Patient reports that his baseline hemoglobin is in the 6-7 g range.  Follow CBCs closely as outpatient across his dialysis.   H/O major abdominal surgery -Patient with recent significant GI surgery including choledocholithiasis and then gangrenous cholecystitis -He appears to be doing well from this standpoint.  Tolerating diet and having BMs. -Wound care for his incision still is an issue, needs daily dressing changes -General surgery has seen today.  They have evaluated postop abdominal wound which is granulating and they debrided a little necrotic tissue from the wound today.  They recommend continuing once daily wet-to-dry dressing changes and have scheduled follow-up on 07/21/2021   Prolonged QT interval- (present on admission) -Will attempt to avoid QT-prolonging medications such as nausea meds, SSRIs; unable to avoid PPI -EKG 1/11, QTC 486 ms down from 507 ms on  1/10.   Permanent atrial fibrillation ([place])- (present on admission) -Patient was on and off amiodarone for rate control during last hospitalization -He was discharged on [place] at discharge due to ongoing intermittent soft blood pressures.  This can be reassessed during outpatient follow-up. -Eliquis discontinued at this time due to high risk of recurrent life-threatening GI bleeding.  This can be reassessed in the future.   End stage renal disease ([place])- (present on admission) -Patient on chronic TTS HD -Nephrology input appreciated.  Seen at HD on 1/11 -Communicated with nephrology team.  They plan to dialyze him today prior to discharge to get him back on his regular TTS schedule.  He was dialyzed off schedule yesterday. -Nephrology also recommends continuing patient's torsemide at discharge because he drinks excessive free fluids between dialysis due to concern for sickle cell crisis and hence has weight gain and they are trying to control back between dialysis.   Hb-SS disease without crisis ([place])- (present on admission) -This may complicate transfusions -No evidence of crisis at this time   COPD not affecting current episode of  care [facility])- (present on admission) -Quit smoking remotely -Appears comfortable -No longer using Breztri -Continue albuterol prn   Visual impairment -Legally blind   Chronic systolic CHF (congestive heart failure) ([place])- (present on admission) -Recent echo with EF 40-45% valvular disease, pulmonary HTN -Volume controlled with HD.  Clinically euvolemic on the outside but has moderate to large volume ascites and small bilateral pleural effusions on CT abdomen. - If ascites does not improve with HD or worsens and becomes very symptomatic then can consider paracentesis.   Essential hypertension- (present on admission) -Discharged on Coreg -Was hypotensive on presentation -Discontinued Coreg at time of discharge due to intermittent soft  blood pressures.   Body mass index is 20.02 kg/m.       Consultants:   [facility]   Procedures:   Hemodialysis EGD 1/11:  Impression: - Esophageal plaques were found, consistent with candidiasis. - LA Grade B reflux esophagitis with no bleeding. - Non-obstructing Schatzki ring. - 3 cm hiatal hernia. - Gastritis with hemorrhage. Focally, treated with argon plasma coagulation (APC). Gastritis is too diffuse for targeted ablation. Biopsied to exclude H. Pylori. - Previously placed endoclips were found in the stomach. - Normal examined duodenum.   Recommendation: Advance diet as tolerated. - Continue present medications. - Continue BID PPI. Add tablet sucralfate 1 g TID-AC. - No aspirin, ibuprofen, naproxen, or other non-steroidal anti-inflammatory drugs. - Await pathology results. - Closely monitor Hgb with hemodialysis. Replace iron IV when needed.     Discharge Instructions  Discharge Instructions     (HEART FAILURE PATIENTS) Call MD:  Anytime you have any of the following symptoms: 1) 3 pound weight gain in 24 hours or 5 pounds in 1 week 2) shortness of breath, with or without a dry hacking cough 3) swelling in the hands, feet or stomach 4) if you have to sleep on extra pillows at night in order to breathe.   Complete by: As directed    Call MD for:   Complete by: As directed    Vomiting blood or coffee ground colored material or passing blood or black tarry stools.   Call MD for:  difficulty breathing, headache or visual disturbances   Complete by: As directed    Call MD for:  extreme fatigue   Complete by: As directed    Call MD for:  persistant dizziness or light-headedness   Complete by: As directed    Call MD for:  persistant nausea and vomiting   Complete by: As directed    Call MD for:  severe uncontrolled pain   Complete by: As directed    Call MD for:  temperature >100.4   Complete by: As directed    Diet - low sodium heart healthy    Complete by: As directed    Discharge wound care:   Complete by: As directed    Continue as per instructions from prior discharge   Increase activity slowly   Complete by: As directed         Medication List     STOP taking these medications    carvedilol 3.125 MG tablet Commonly known as: COREG   diphenhydramine-acetaminophen 25-500 MG Tabs tablet Commonly known as: TYLENOL PM   Eliquis 5 MG Tabs tablet Generic drug: apixaban       TAKE these medications    acetaminophen 500 MG tablet Commonly known as: TYLENOL Take 1,000 mg by mouth every 6 (six) hours as needed for moderate pain or headache.   albuterol 108 (  90 Base) MCG/ACT inhaler Commonly known as: VENTOLIN HFA Inhale 2 puffs into the lungs every 6 (six) hours as needed for wheezing or shortness of breath.   allopurinol 100 MG tablet Commonly known as: ZYLOPRIM TAKE 1 TABLET BY MOUTH EVERY DAY   Auryxia 1 GM 210 MG(Fe) tablet Generic drug: ferric citrate Take 420 mg by mouth 3 (three) times daily.   folic acid 1 MG tablet Commonly known as: FOLVITE TAKE 1 TABLET BY MOUTH EVERY DAY   gabapentin 100 MG capsule Commonly known as: NEURONTIN TAKE 1 CAPSULE BY MOUTH EVERYDAY AT BEDTIME What changed: See the new instructions.   hydroxyurea 500 MG capsule Commonly known as: HYDREA Take 1 capsule (500 mg total) by mouth daily. TAKE 1 CAPSULE BY MOUTH EVERY DAY WITH FOOD TO MINIMIZE GI SIDE EFFECTS   lidocaine-prilocaine cream Commonly known as: EMLA Apply 1 application topically daily as needed (port access).   nystatin 100000 UNIT/ML suspension Commonly known as: MYCOSTATIN Take 5 mLs (500,000 Units total) by mouth 4 (four) times daily for 10 days.   oxycodone 5 MG capsule Commonly known as: OXY-IR Take 5-10 mg by mouth every 6 (six) hours as needed for pain.   pantoprazole 40 MG tablet Commonly known as: Protonix Take 1 tablet (40 mg total) by mouth 2 (two) times daily.   sucralfate 1 g  tablet Commonly known as: Carafate Take 1 tablet (1 g total) by mouth 4 (four) times daily -  with meals and at bedtime for 7 days. can dissolve each tablet in 30 ml water to create a slurry   tiZANidine 4 MG tablet Commonly known as: ZANAFLEX TAKE 0.5 TABLET BY MOUTH EVERY 8 HOURS AS NEEDED FOR MUSCLE SPASMS What changed:  how much to take how to take this when to take this reasons to take this additional instructions   torsemide 20 MG tablet Commonly known as: DEMADEX Take 80 mg by mouth daily.   Vitamin D (Ergocalciferol) 1.25 MG (50000 UNIT) Caps capsule Commonly known as: DRISDOL TAKE 1 CAPSULE BY MOUTH ONE TIME PER WEEK What changed: See the new instructions.       No Known Allergies    Procedures/Studies: CT ABDOMEN PELVIS W CONTRAST  Result Date: 06/27/2021 CLINICAL DATA:  Abdominal pain EXAM: CT ABDOMEN AND PELVIS WITH CONTRAST TECHNIQUE: Multidetector CT imaging of the abdomen and pelvis was performed using the standard protocol following bolus administration of intravenous contrast. CONTRAST:  32mL OMNIPAQUE IOHEXOL 300 MG/ML  SOLN COMPARISON:  CT abdomen/pelvis 05/29/2021 FINDINGS: Lower chest: There are small bilateral pleural effusions. The heart is markedly enlarged. There is no pericardial effusion to the level imaged. A calcification in the left ventricle is again noted. Hepatobiliary: No focal liver lesions are seen. The gallbladder is surgically absent. There is no biliary ductal dilatation. The previously seen biliary stent has been removed. Pancreas: Unremarkable. The previously seen pancreatic stent has been removed. Spleen: The spleen is atrophic with coarse calcifications, unchanged. Adrenals/Urinary Tract: The adrenals are unremarkable. The kidneys are atrophic bilaterally. Multiple cysts are noted bilaterally. There are no stones. There is no hydronephrosis or hydroureter. Bladder is decompressed, likely accounting for the wall thickness. Stomach/Bowel: A  linear metallic density foreign body is noted in the stomach likely reflecting a vascular clip. The stomach is otherwise unremarkable. Postsurgical changes are noted in the right lower quadrant consistent with bowel resection. There is no evidence of mechanical small bowel obstruction. There is no definite abnormal bowel wall thickening or inflammatory change. The  appendix is normal. Vascular/Lymphatic: There is scattered calcified atherosclerotic plaque throughout the nonaneurysmal abdominal aorta. The major branch vessels are patent proximally. The main portal vein is patent. There is no abdominal or pelvic lymphadenopathy. Reproductive: The prostate and seminal vesicles are unremarkable. Other: There is moderate-to-large volume ascites, increased since 05/29/2021. There is no organized or drainable fluid collection. There is no free intraperitoneal air. Musculoskeletal: Diffuse sclerosis throughout the bones is again favored to reflect sequela of sickle cell and/or renal disease. IMPRESSION: 1. Moderate-to-large volume ascites, increased since 05/29/2021. 2. Small bilateral pleural effusions. 3. Postsurgical changes reflecting partial small bowel resection without evidence of obstruction. Electronically Signed   By: Valetta Mole M.D.   On: 06/27/2021 09:43    DG Chest Port 1 View  Result Date: 06/27/2021 CLINICAL DATA:  66 year old male with history of weakness and hypotension. EXAM: PORTABLE CHEST 1 VIEW COMPARISON:  Chest x-ray 05/28/2021. FINDINGS: Lung volumes are low. No consolidative airspace disease. No pleural effusions. No pneumothorax. No pulmonary nodule or mass noted. Pulmonary vasculature and the cardiomediastinal silhouette are within normal limits. Atherosclerotic calcifications in the thoracic aorta. IMPRESSION: 1. Low lung volumes without radiographic evidence of acute cardiopulmonary disease. 2. Aortic atherosclerosis. Electronically Signed   By: Vinnie Langton M.D.   On: 06/27/2021 07:55       Subjective: Patient reports that he had a BM this morning but does not know color.  Tolerating diet.  No pain reported.  No dizziness or lightheadedness or chest pain or dyspnea.  Discharge Exam:  Vitals:   06/28/21 1641 06/28/21 2143 06/29/21 0740 06/29/21 1145  BP: 91/68 (!) 87/62 95/62 95/65   Pulse: 72 67 84 83  Resp: 14 18 17 17   Temp: 98 F (36.7 C) 98.1 F (36.7 C) 98.8 F (37.1 C) 98.6 F (37 C)  TempSrc: Oral Oral Oral Oral  SpO2: 98% 96% 97% 98%  Weight:      Height:        General exam: Middle-age male, small built and frail, chronically ill looking, sitting up comfortably at edge of bed.  Had finished eating his breakfast. Respiratory system: Clear to auscultation. Respiratory effort normal. Cardiovascular system: S1 & S2 heard, RRR. No JVD, murmurs, rubs, gallops or clicks. No pedal edema.   Gastrointestinal system: Abdomen is mildly distended, soft and nontender. No organomegaly or masses felt. Normal bowel sounds heard.  Epigastric/mid abdominal dressing clean and dry. Central nervous system: Alert and oriented. No focal neurological deficits.  Legally blind. Extremities: Symmetric 5 x 5 power.  Right upper arm AV fistula. Skin: No rashes, lesions or ulcers Psychiatry: Judgement and insight appear normal. Mood & affect appropriate.     The results of significant diagnostics from this hospitalization (including imaging, microbiology, ancillary and laboratory) are listed below for reference.     Microbiology: Recent Results (from the past 240 hour(s))  Culture, blood (routine x 2)     Status: None (Preliminary result)   Collection Time: 06/27/21  8:29 AM   Specimen: BLOOD LEFT ARM  Result Value Ref Range Status   Specimen Description BLOOD LEFT ARM  Final   Special Requests   Final    BOTTLES DRAWN AEROBIC AND ANAEROBIC Blood Culture adequate volume   Culture   Final    NO GROWTH 2 DAYS Performed at [facility], [address]  [place], [place] [address]    Report Status PENDING  Incomplete  Resp Panel by RT-PCR (Flu A&B, Covid)     Status:  None   Collection Time: 06/27/21  8:30 AM   Specimen: Nasopharyngeal(NP) swabs in vial transport medium  Result Value Ref Range Status   SARS Coronavirus 2 by RT PCR NEGATIVE NEGATIVE Final    Comment: (NOTE) SARS-CoV-2 target nucleic acids are NOT DETECTED.  The SARS-CoV-2 RNA is generally detectable in upper respiratory specimens during the acute phase of infection. The lowest concentration of SARS-CoV-2 viral copies this assay can detect is 138 copies/mL. A negative result does not preclude SARS-Cov-2 infection and should not be used as the sole basis for treatment or other patient management decisions. A negative result may occur with  improper specimen collection/handling, submission of specimen other than nasopharyngeal swab, presence of viral mutation(s) within the areas targeted by this assay, and inadequate number of viral copies(<138 copies/mL). A negative result must be combined with clinical observations, patient history, and epidemiological information. The expected result is Negative.  Fact Sheet for Patients:  EntrepreneurPulse.com.au  Fact Sheet for Healthcare Providers:  IncredibleEmployment.be  This test is no t yet approved or cleared by the [place] FDA and  has been authorized for detection and/or diagnosis of SARS-CoV-2 by FDA under an Emergency Use Authorization (EUA). This EUA will remain  in effect (meaning this test can be used) for the duration of the COVID-19 declaration under Section 564(b)(1) of the Act, 21 U.S.C.section 360bbb-3(b)(1), unless the authorization is terminated  or revoked sooner.       Influenza A by PCR NEGATIVE NEGATIVE Final   Influenza B by PCR NEGATIVE NEGATIVE Final    Comment: (NOTE) The Xpert Xpress SARS-CoV-2/FLU/RSV plus assay is intended as an aid in the diagnosis of  influenza from Nasopharyngeal swab specimens and should not be used as a sole basis for treatment. Nasal washings and aspirates are unacceptable for Xpert Xpress SARS-CoV-2/FLU/RSV testing.  Fact Sheet for Patients: EntrepreneurPulse.com.au  Fact Sheet for Healthcare Providers: IncredibleEmployment.be  This test is not yet approved or cleared by the [place] FDA and has been authorized for detection and/or diagnosis of SARS-CoV-2 by FDA under an Emergency Use Authorization (EUA). This EUA will remain in effect (meaning this test can be used) for the duration of the COVID-19 declaration under Section 564(b)(1) of the Act, 21 U.S.C. section 360bbb-3(b)(1), unless the authorization is terminated or revoked.  Performed at [facility], [place] [address] [place], [place] [address]   Culture, blood (routine x 2)     Status: None (Preliminary result)   Collection Time: 06/27/21  8:35 AM   Specimen: BLOOD LEFT WRIST  Result Value Ref Range Status   Specimen Description BLOOD LEFT WRIST  Final   Special Requests   Final    BOTTLES DRAWN AEROBIC AND ANAEROBIC Blood Culture adequate volume   Culture   Final    NO GROWTH 2 DAYS Performed at [facility], [place] [address], [place], [place] [address]    Report Status PENDING  Incomplete     Labs: CBC: Recent Labs  Lab 06/27/21 0830 06/28/21 0633 06/28/21 1707 06/28/21 2315 06/29/21 0329  WBC 14.0* 10.9* 10.0 9.0 8.6  NEUTROABS 11.5*  --   --   --   --   HGB 4.7* 8.0* 7.3* 7.8* 7.5*  HCT 13.6* 22.2* 21.5* 22.2* 21.5*  MCV 86.6 85.1 85.7 85.7 86.0  PLT 254 221 235 232 381    Basic Metabolic Panel: Recent Labs  Lab 06/27/21 0830 06/28/21 0633  NA 134* 130*  K 3.6 3.7  CL 91*  91*  CO2 28 23  GLUCOSE 85 80  BUN 41* 54*  CREATININE 5.65* 6.16*  CALCIUM 8.1* 7.7*    Liver Function Tests: Recent Labs  Lab 06/27/21 0830  AST 23  ALT 14  ALKPHOS 563*  BILITOT 3.0*   PROT 6.1*  ALBUMIN <1.5*     Time coordinating discharge: 35 minutes  SIGNED:  Vernell Leep, MD,  FACP, [facility], [facility], [facility] (Care Management Physician Certified). [facility]  To contact the attending provider between 7A-7P or the covering provider during after hours 7P-7A, please log into the web site [email or web address] and access using universal [place] password for that web site. If you do not have the password, please call the hospital operator.

## 2021-06-29 NOTE — Progress Notes (Signed)
Mobility Specialist Progress Note   06/29/21 1500  Mobility  Activity Ambulated in hall  Level of Assistance Standby assist, set-up cues, supervision of patient - no hands on  Assistive Device None  Distance Ambulated (ft) 320 ft  Mobility Ambulated independently in hallway  Mobility Response Tolerated well  Mobility performed by Mobility specialist  $Mobility charge 1 Mobility   Received pt in bed having no complaints and agreeable to mobility. Asymptomatic throughout ambulation, x1 seated break d/t fatigue but returned back to EOB w/ call bell in reach and all needs met.     Holland Falling Mobility Specialist Phone Number [phone]

## 2021-06-29 NOTE — Progress Notes (Signed)
Will make hospital follow up. Originally established care with Dr. Lyndel Safe ( hospital consult in 2019). He has never been to our office. I will see him in follow up on  07/19/21 at 10:30 am. ( Non-dialysis day)

## 2021-06-29 NOTE — Progress Notes (Signed)
1 Day Post-Op  Subjective: This is a very pleasant gentleman who is s/p dx lap with conversion to ex lap with SBR and cholecystectomy for gangrenous cholecystitis and a chronic ischemic segment of SB with obstruction by Dr. Donne Hazel on 05/24/21.  He has been doing well, but represented to the ED on 06/27/21 secondary to an UGI bleed.  He underwent EGD where he was noted to have candida esophagitis as well as gastritis with hemorrhage.  He has been placed on PPI and carafate.  His eliquis has been placed on hold. We have been asked to see him while he is here for a wound check from his surgical wound.  ROS: See above, otherwise other systems negative  Objective: Vital signs in last 24 hours: Temp:  [97.7 F (36.5 C)-98.8 F (37.1 C)] 98.6 F (37 C) (01/12 1145) Pulse Rate:  [67-87] 83 (01/12 1145) Resp:  [13-18] 17 (01/12 1145) BP: (79-95)/(52-68) 95/65 (01/12 1145) SpO2:  [96 %-100 %] 98 % (01/12 1145) Last BM Date: 06/29/21  Intake/Output from previous day: 01/11 0701 - 01/12 0700 In: 150 [P.O.:150] Out: -500  Intake/Output this shift: Total I/O In: 255 [P.O.:255] Out: -   PE: Gen: NAD Abd: soft, NT, mild distension, +BS, incisional wound pictured below with some granulating tissue coming in, I debrided some of the necrotic tissue from the proximal and distal aspects of the wound    Lab Results:  Recent Labs    06/28/21 2315 06/29/21 0329  WBC 9.0 8.6  HGB 7.8* 7.5*  HCT 22.2* 21.5*  PLT 232 228   BMET Recent Labs    06/27/21 0830 06/28/21 0633  NA 134* 130*  K 3.6 3.7  CL 91* 91*  CO2 28 23  GLUCOSE 85 80  BUN 41* 54*  CREATININE 5.65* 6.16*  CALCIUM 8.1* 7.7*   PT/INR Recent Labs    06/27/21 0830  LABPROT 21.4*  INR 1.9*   CMP     Component Value Date/Time   NA 130 (L) 06/28/2021 0633   NA 135 05/24/2020 1119   K 3.7 06/28/2021 0633   CL 91 (L) 06/28/2021 0633   CO2 23 06/28/2021 0633   GLUCOSE 80 06/28/2021 0633   BUN 54 (H) 06/28/2021  0633   BUN 25 05/24/2020 1119   CREATININE 6.16 (H) 06/28/2021 0633   CREATININE 2.90 (H) 08/11/2015 1359   CALCIUM 7.7 (L) 06/28/2021 0633   PROT 6.1 (L) 06/27/2021 0830   PROT 7.5 05/24/2020 1119   ALBUMIN <1.5 (L) 06/27/2021 0830   ALBUMIN 4.1 05/24/2020 1119   AST 23 06/27/2021 0830   ALT 14 06/27/2021 0830   ALKPHOS 563 (H) 06/27/2021 0830   BILITOT 3.0 (H) 06/27/2021 0830   BILITOT 2.4 (H) 05/24/2020 1119   GFRNONAA 10 (L) 06/28/2021 0633   GFRNONAA 23 (L) 08/11/2015 1359   GFRAA 24 (L) 05/24/2020 1119   GFRAA 27 (L) 08/11/2015 1359   Lipase     Component Value Date/Time   LIPASE 30 06/27/2021 0830       Studies/Results: No results found.  Anti-infectives: Anti-infectives (From admission, onward)    None        Assessment/Plan S/p ex lap for cholecystectomy and SBR secondary to cholecystitis and obstruction from a chronically ischemic segment of small bowel 05/24/21 Dr. Donne Hazel - Patient was readmitted 1/10 with UGI. He is going home today and we were asked to see prior to discharge. - Wound is granulating, I debrided a little necrotic tissue  from the wound today - continue once daily wet to dry dressing changes. Shower with wound open - he has follow up scheduled in the office with Dr. Donne Hazel on 07/21/21 at 11am    LOS: 1 day    Margie Billet, [facility] Surgery 06/29/2021, 2:20 PM Please see Amion for pager number during day hours 7:00am-4:30pm or 7:00am -11:30am on weekends

## 2021-06-30 ENCOUNTER — Encounter (HOSPITAL_COMMUNITY): Payer: Self-pay | Admitting: Internal Medicine

## 2021-06-30 DIAGNOSIS — D500 Iron deficiency anemia secondary to blood loss (chronic): Secondary | ICD-10-CM | POA: Diagnosis not present

## 2021-06-30 DIAGNOSIS — D571 Sickle-cell disease without crisis: Secondary | ICD-10-CM | POA: Diagnosis not present

## 2021-06-30 DIAGNOSIS — I272 Pulmonary hypertension, unspecified: Secondary | ICD-10-CM | POA: Diagnosis not present

## 2021-06-30 DIAGNOSIS — K2100 Gastro-esophageal reflux disease with esophagitis, without bleeding: Secondary | ICD-10-CM | POA: Diagnosis not present

## 2021-06-30 DIAGNOSIS — H548 Legal blindness, as defined in USA: Secondary | ICD-10-CM | POA: Diagnosis not present

## 2021-06-30 DIAGNOSIS — Z87891 Personal history of nicotine dependence: Secondary | ICD-10-CM | POA: Diagnosis not present

## 2021-06-30 DIAGNOSIS — Z992 Dependence on renal dialysis: Secondary | ICD-10-CM | POA: Diagnosis not present

## 2021-06-30 DIAGNOSIS — I132 Hypertensive heart and chronic kidney disease with heart failure and with stage 5 chronic kidney disease, or end stage renal disease: Secondary | ICD-10-CM | POA: Diagnosis not present

## 2021-06-30 DIAGNOSIS — Z79891 Long term (current) use of opiate analgesic: Secondary | ICD-10-CM | POA: Diagnosis not present

## 2021-06-30 DIAGNOSIS — N186 End stage renal disease: Secondary | ICD-10-CM | POA: Diagnosis not present

## 2021-06-30 DIAGNOSIS — I5022 Chronic systolic (congestive) heart failure: Secondary | ICD-10-CM | POA: Diagnosis not present

## 2021-06-30 DIAGNOSIS — J449 Chronic obstructive pulmonary disease, unspecified: Secondary | ICD-10-CM | POA: Diagnosis not present

## 2021-06-30 DIAGNOSIS — Z4801 Encounter for change or removal of surgical wound dressing: Secondary | ICD-10-CM | POA: Diagnosis not present

## 2021-06-30 DIAGNOSIS — M109 Gout, unspecified: Secondary | ICD-10-CM | POA: Diagnosis not present

## 2021-06-30 DIAGNOSIS — Z7901 Long term (current) use of anticoagulants: Secondary | ICD-10-CM | POA: Diagnosis not present

## 2021-06-30 DIAGNOSIS — I4821 Permanent atrial fibrillation: Secondary | ICD-10-CM | POA: Diagnosis not present

## 2021-06-30 DIAGNOSIS — Z48815 Encounter for surgical aftercare following surgery on the digestive system: Secondary | ICD-10-CM | POA: Diagnosis not present

## 2021-06-30 LAB — SURGICAL PATHOLOGY

## 2021-06-30 NOTE — Care Management Note (Signed)
Patient informed that appropriate DC instructions could not be provided d/t technicalities outside of my scope, referring to remaining medication. Patient stated that he does not care. AMA paper provided. Escorted from unit via wheelchair without incidence .

## 2021-07-01 ENCOUNTER — Telehealth: Payer: Self-pay | Admitting: Physician Assistant

## 2021-07-01 DIAGNOSIS — N2581 Secondary hyperparathyroidism of renal origin: Secondary | ICD-10-CM | POA: Diagnosis not present

## 2021-07-01 DIAGNOSIS — D571 Sickle-cell disease without crisis: Secondary | ICD-10-CM | POA: Diagnosis not present

## 2021-07-01 DIAGNOSIS — D57819 Other sickle-cell disorders with crisis, unspecified: Secondary | ICD-10-CM | POA: Diagnosis not present

## 2021-07-01 DIAGNOSIS — D509 Iron deficiency anemia, unspecified: Secondary | ICD-10-CM | POA: Diagnosis not present

## 2021-07-01 DIAGNOSIS — D57219 Sickle-cell/Hb-C disease with crisis, unspecified: Secondary | ICD-10-CM | POA: Diagnosis not present

## 2021-07-01 DIAGNOSIS — Z992 Dependence on renal dialysis: Secondary | ICD-10-CM | POA: Diagnosis not present

## 2021-07-01 DIAGNOSIS — D631 Anemia in chronic kidney disease: Secondary | ICD-10-CM | POA: Diagnosis not present

## 2021-07-01 DIAGNOSIS — N186 End stage renal disease: Secondary | ICD-10-CM | POA: Diagnosis not present

## 2021-07-01 LAB — TYPE AND SCREEN
ABO/RH(D): AB POS
Antibody Screen: NEGATIVE
Unit division: 0
Unit division: 0
Unit division: 0
Unit division: 0
Unit division: 0

## 2021-07-01 LAB — BPAM RBC
Blood Product Expiration Date: 202301262359
Blood Product Expiration Date: 202301282359
Blood Product Expiration Date: 202301312359
Blood Product Expiration Date: 202302022359
Blood Product Expiration Date: 202302092359
ISSUE DATE / TIME: 202301101200
ISSUE DATE / TIME: 202301101557
ISSUE DATE / TIME: 202301102056
Unit Type and Rh: 600
Unit Type and Rh: 600
Unit Type and Rh: 600
Unit Type and Rh: 6200
Unit Type and Rh: 6200

## 2021-07-01 NOTE — Telephone Encounter (Signed)
Transition of care contact from inpatient facility  Date of Discharge: 06/29/21 Date of Contact: 07/11/21 Method of contact: Phone  Contacted patient to discuss transition of care from inpatient admission. Patient was discharged on 1/12 with a diagnosis of acute upper GI bleed, candida esophagitis, recent choledocholithiasis, and permanent a. Fib.   Medication changes reviewed. Patient reports he does not have all his medications but was not sure which ones he needed. I asked him to call back with list of needed meds so I can refill them.   Follow up appointments reviewed. Pt reports no further needs at this time.  Anice Paganini, PA-C 07/01/2021, 11:00 AM  [employer]

## 2021-07-02 LAB — CULTURE, BLOOD (ROUTINE X 2)
Culture: NO GROWTH
Culture: NO GROWTH
Special Requests: ADEQUATE
Special Requests: ADEQUATE

## 2021-07-03 ENCOUNTER — Other Ambulatory Visit (HOSPITAL_COMMUNITY): Payer: Self-pay

## 2021-07-04 DIAGNOSIS — D57219 Sickle-cell/Hb-C disease with crisis, unspecified: Secondary | ICD-10-CM | POA: Diagnosis not present

## 2021-07-04 DIAGNOSIS — Z992 Dependence on renal dialysis: Secondary | ICD-10-CM | POA: Diagnosis not present

## 2021-07-04 DIAGNOSIS — D571 Sickle-cell disease without crisis: Secondary | ICD-10-CM | POA: Diagnosis not present

## 2021-07-04 DIAGNOSIS — D631 Anemia in chronic kidney disease: Secondary | ICD-10-CM | POA: Diagnosis not present

## 2021-07-04 DIAGNOSIS — N186 End stage renal disease: Secondary | ICD-10-CM | POA: Diagnosis not present

## 2021-07-04 DIAGNOSIS — D57819 Other sickle-cell disorders with crisis, unspecified: Secondary | ICD-10-CM | POA: Diagnosis not present

## 2021-07-04 DIAGNOSIS — D509 Iron deficiency anemia, unspecified: Secondary | ICD-10-CM | POA: Diagnosis not present

## 2021-07-04 DIAGNOSIS — N2581 Secondary hyperparathyroidism of renal origin: Secondary | ICD-10-CM | POA: Diagnosis not present

## 2021-07-06 ENCOUNTER — Other Ambulatory Visit (HOSPITAL_COMMUNITY): Payer: Self-pay

## 2021-07-08 ENCOUNTER — Other Ambulatory Visit: Payer: Self-pay | Admitting: Family Medicine

## 2021-07-10 ENCOUNTER — Other Ambulatory Visit (HOSPITAL_COMMUNITY): Payer: Self-pay

## 2021-07-11 DIAGNOSIS — Z992 Dependence on renal dialysis: Secondary | ICD-10-CM | POA: Diagnosis not present

## 2021-07-11 DIAGNOSIS — D57819 Other sickle-cell disorders with crisis, unspecified: Secondary | ICD-10-CM | POA: Diagnosis not present

## 2021-07-11 DIAGNOSIS — D57219 Sickle-cell/Hb-C disease with crisis, unspecified: Secondary | ICD-10-CM | POA: Diagnosis not present

## 2021-07-11 DIAGNOSIS — D571 Sickle-cell disease without crisis: Secondary | ICD-10-CM | POA: Diagnosis not present

## 2021-07-11 DIAGNOSIS — N2581 Secondary hyperparathyroidism of renal origin: Secondary | ICD-10-CM | POA: Diagnosis not present

## 2021-07-11 DIAGNOSIS — D631 Anemia in chronic kidney disease: Secondary | ICD-10-CM | POA: Diagnosis not present

## 2021-07-11 DIAGNOSIS — N186 End stage renal disease: Secondary | ICD-10-CM | POA: Diagnosis not present

## 2021-07-11 DIAGNOSIS — D509 Iron deficiency anemia, unspecified: Secondary | ICD-10-CM | POA: Diagnosis not present

## 2021-07-12 ENCOUNTER — Telehealth: Payer: Self-pay

## 2021-07-12 NOTE — Telephone Encounter (Incomplete Revision)
Oxycodone Muscle relaxer Gabapentin

## 2021-07-12 NOTE — Telephone Encounter (Addendum)
Oxycodone Muscle relaxer Gabapentin

## 2021-07-13 ENCOUNTER — Other Ambulatory Visit: Payer: Self-pay | Admitting: Family Medicine

## 2021-07-13 DIAGNOSIS — D571 Sickle-cell disease without crisis: Secondary | ICD-10-CM

## 2021-07-13 DIAGNOSIS — M545 Low back pain, unspecified: Secondary | ICD-10-CM

## 2021-07-13 DIAGNOSIS — G894 Chronic pain syndrome: Secondary | ICD-10-CM

## 2021-07-13 MED ORDER — OXYCODONE HCL 5 MG PO CAPS: 10.0000 mg | ORAL_CAPSULE | Freq: Four times a day (QID) | ORAL | 0 refills | Status: DC | PRN

## 2021-07-13 MED ORDER — TIZANIDINE HCL 4 MG PO TABS: 2.0000 mg | ORAL_TABLET | Freq: Three times a day (TID) | ORAL | 1 refills | Status: DC | PRN

## 2021-07-13 NOTE — Progress Notes (Signed)
Reviewed PDMP substance reporting system prior to prescribing opiate medications. No inconsistencies noted.  Meds ordered this encounter  Medications   oxycodone (OXY-IR) 5 MG capsule    Sig: Take 2 capsules (10 mg total) by mouth every 6 (six) hours as needed for pain.    Dispense:  60 capsule    Refill:  0    Order Specific Question:   Supervising Provider    Answer:   [name] [[id number]]   tiZANidine (ZANAFLEX) 4 MG tablet    Sig: Take 0.5 tablets (2 mg total) by mouth every 8 (eight) hours as needed for muscle spasms.    Dispense:  15 tablet    Refill:  1    Order Specific Question:   Supervising Provider    Answer:   [name] [[id number]]   [name]  APRN, MSN, FNP-C [facility] [address] [place], [place] [address] [phone]

## 2021-07-14 ENCOUNTER — Telehealth: Payer: Self-pay | Admitting: *Deleted

## 2021-07-14 DIAGNOSIS — I4821 Permanent atrial fibrillation: Secondary | ICD-10-CM | POA: Diagnosis not present

## 2021-07-14 DIAGNOSIS — Z992 Dependence on renal dialysis: Secondary | ICD-10-CM | POA: Diagnosis not present

## 2021-07-14 DIAGNOSIS — N186 End stage renal disease: Secondary | ICD-10-CM | POA: Diagnosis not present

## 2021-07-14 DIAGNOSIS — K2100 Gastro-esophageal reflux disease with esophagitis, without bleeding: Secondary | ICD-10-CM | POA: Diagnosis not present

## 2021-07-14 DIAGNOSIS — D500 Iron deficiency anemia secondary to blood loss (chronic): Secondary | ICD-10-CM | POA: Diagnosis not present

## 2021-07-14 DIAGNOSIS — Z87891 Personal history of nicotine dependence: Secondary | ICD-10-CM | POA: Diagnosis not present

## 2021-07-14 DIAGNOSIS — M109 Gout, unspecified: Secondary | ICD-10-CM | POA: Diagnosis not present

## 2021-07-14 DIAGNOSIS — D571 Sickle-cell disease without crisis: Secondary | ICD-10-CM | POA: Diagnosis not present

## 2021-07-14 DIAGNOSIS — I5022 Chronic systolic (congestive) heart failure: Secondary | ICD-10-CM | POA: Diagnosis not present

## 2021-07-14 DIAGNOSIS — Z79891 Long term (current) use of opiate analgesic: Secondary | ICD-10-CM | POA: Diagnosis not present

## 2021-07-14 DIAGNOSIS — I132 Hypertensive heart and chronic kidney disease with heart failure and with stage 5 chronic kidney disease, or end stage renal disease: Secondary | ICD-10-CM | POA: Diagnosis not present

## 2021-07-14 DIAGNOSIS — Z48815 Encounter for surgical aftercare following surgery on the digestive system: Secondary | ICD-10-CM | POA: Diagnosis not present

## 2021-07-14 DIAGNOSIS — J449 Chronic obstructive pulmonary disease, unspecified: Secondary | ICD-10-CM | POA: Diagnosis not present

## 2021-07-14 DIAGNOSIS — Z4801 Encounter for change or removal of surgical wound dressing: Secondary | ICD-10-CM | POA: Diagnosis not present

## 2021-07-14 DIAGNOSIS — Z7901 Long term (current) use of anticoagulants: Secondary | ICD-10-CM | POA: Diagnosis not present

## 2021-07-14 DIAGNOSIS — H548 Legal blindness, as defined in USA: Secondary | ICD-10-CM | POA: Diagnosis not present

## 2021-07-14 DIAGNOSIS — I272 Pulmonary hypertension, unspecified: Secondary | ICD-10-CM | POA: Diagnosis not present

## 2021-07-14 NOTE — Telephone Encounter (Signed)
Transition Care Management Unsuccessful Follow-up Telephone Call  Date of discharge and from where:  [facility] 06/30/21  Attempts:  1st Attempt  Reason for unsuccessful TCM follow-up call:  No answer/busy  Jacqlyn Larsen [facility], BSN RN Case Manager [phone]

## 2021-07-17 ENCOUNTER — Other Ambulatory Visit: Payer: Self-pay

## 2021-07-17 ENCOUNTER — Ambulatory Visit (HOSPITAL_COMMUNITY)
Admission: RE | Admit: 2021-07-17 | Discharge: 2021-07-17 | Disposition: A | Discharge status: Home / Self Care | Payer: HMO | Source: Ambulatory Visit | Attending: Family Medicine | Admitting: Family Medicine

## 2021-07-17 ENCOUNTER — Encounter (HOSPITAL_COMMUNITY): Payer: Self-pay

## 2021-07-17 VITALS — BP 128/56 | HR 100 | Wt 142.6 lb

## 2021-07-17 DIAGNOSIS — D571 Sickle-cell disease without crisis: Secondary | ICD-10-CM

## 2021-07-17 DIAGNOSIS — Z9049 Acquired absence of other specified parts of digestive tract: Secondary | ICD-10-CM | POA: Insufficient documentation

## 2021-07-17 DIAGNOSIS — H548 Legal blindness, as defined in USA: Secondary | ICD-10-CM | POA: Insufficient documentation

## 2021-07-17 DIAGNOSIS — Z992 Dependence on renal dialysis: Secondary | ICD-10-CM | POA: Insufficient documentation

## 2021-07-17 DIAGNOSIS — M7989 Other specified soft tissue disorders: Secondary | ICD-10-CM | POA: Diagnosis not present

## 2021-07-17 DIAGNOSIS — I10 Essential (primary) hypertension: Secondary | ICD-10-CM

## 2021-07-17 DIAGNOSIS — Z8719 Personal history of other diseases of the digestive system: Secondary | ICD-10-CM

## 2021-07-17 DIAGNOSIS — N186 End stage renal disease: Secondary | ICD-10-CM

## 2021-07-17 DIAGNOSIS — D649 Anemia, unspecified: Secondary | ICD-10-CM | POA: Diagnosis not present

## 2021-07-17 DIAGNOSIS — I132 Hypertensive heart and chronic kidney disease with heart failure and with stage 5 chronic kidney disease, or end stage renal disease: Secondary | ICD-10-CM | POA: Diagnosis not present

## 2021-07-17 DIAGNOSIS — K2971 Gastritis, unspecified, with bleeding: Secondary | ICD-10-CM | POA: Diagnosis not present

## 2021-07-17 DIAGNOSIS — I5042 Chronic combined systolic (congestive) and diastolic (congestive) heart failure: Secondary | ICD-10-CM | POA: Diagnosis not present

## 2021-07-17 DIAGNOSIS — Z87891 Personal history of nicotine dependence: Secondary | ICD-10-CM | POA: Diagnosis not present

## 2021-07-17 DIAGNOSIS — I428 Other cardiomyopathies: Secondary | ICD-10-CM | POA: Diagnosis not present

## 2021-07-17 DIAGNOSIS — K56609 Unspecified intestinal obstruction, unspecified as to partial versus complete obstruction: Secondary | ICD-10-CM

## 2021-07-17 DIAGNOSIS — R188 Other ascites: Secondary | ICD-10-CM | POA: Diagnosis not present

## 2021-07-17 DIAGNOSIS — I4819 Other persistent atrial fibrillation: Secondary | ICD-10-CM | POA: Diagnosis not present

## 2021-07-17 DIAGNOSIS — Z09 Encounter for follow-up examination after completed treatment for conditions other than malignant neoplasm: Secondary | ICD-10-CM | POA: Insufficient documentation

## 2021-07-17 DIAGNOSIS — Z79899 Other long term (current) drug therapy: Secondary | ICD-10-CM | POA: Insufficient documentation

## 2021-07-17 DIAGNOSIS — I272 Pulmonary hypertension, unspecified: Secondary | ICD-10-CM | POA: Diagnosis not present

## 2021-07-17 DIAGNOSIS — I5032 Chronic diastolic (congestive) heart failure: Secondary | ICD-10-CM

## 2021-07-17 DIAGNOSIS — Z7901 Long term (current) use of anticoagulants: Secondary | ICD-10-CM | POA: Diagnosis not present

## 2021-07-17 DIAGNOSIS — B3781 Candidal esophagitis: Secondary | ICD-10-CM | POA: Insufficient documentation

## 2021-07-17 MED ORDER — CARVEDILOL 3.125 MG PO TABS: 3.1250 mg | ORAL_TABLET | Freq: Two times a day (BID) | ORAL | 3 refills | Status: DC

## 2021-07-17 NOTE — Patient Instructions (Signed)
Restart Carvedilol 3.125mg  Twice daily  Please Keep your follow up appointment with Dr. Haroldine Laws   If you have any questions or concerns before your next appointment please send [place] a message through [facility] or call our office at [phone].    TO LEAVE A MESSAGE FOR THE NURSE SELECT OPTION 2, PLEASE LEAVE A MESSAGE INCLUDING: YOUR NAME DATE OF BIRTH CALL BACK NUMBER REASON FOR CALL**this is important as we prioritize the call backs  YOU WILL RECEIVE A CALL BACK THE SAME DAY AS LONG AS YOU CALL BEFORE 4:00 PM At the [facility], you and your health needs are our priority. As part of our continuing mission to provide you with exceptional heart care, we have created designated Provider Care Teams. These Care Teams include your primary Cardiologist (physician) and Advanced Practice Providers (APPs- Physician Assistants and Nurse Practitioners) who all work together to provide you with the care you need, when you need it.   You may see any of the following providers on your designated Care Team at your next follow up: Dr Glori Bickers Dr Haynes Kerns, NP Lyda Jester, [place] [facility] [place], [place] Audry Riles, PharmD   Please be sure to bring in all your medications bottles to every appointment.

## 2021-07-17 NOTE — Progress Notes (Addendum)
Patient ID: Patrick [name], male   DOB: 01-17-1956, 66 y.o.   MRN: [id number]     Advanced Heart Failure Clinic Note   PCP: Dorena Dew, FNP Sickle Cell: Dr Zigmund Daniel  Opthalmologist: [name] HF MD: Dr Haroldine Laws Nephrologist: Dr Moshe Cipro.   HPI: Patrick [name] is a 66 y.o. male  with a history of sickle cell anemia, systolic HF due to NICM EF 40% 11/2012 initially diagnosed 05/2011, no CAD by cath 05/2011, CKD stage III, legally blind, and tobacco abuse.   Admitted 7/6 through 4/40/3474 with A/C systolic heart failure and CKD in setting of new A fib. Started coumadin. Discharge weight was 135 pounds.   Admitted 4/19 with upper GI bleed and profound anemia with Hgb of 4.6 with Sickle Cell. Transfused with RBCs. EGD showed nonbleeding gastritis, biopsies were obtained. Colonoscopy showed small internal hemorrhoids, otherwise normal. .  Admitted 25/9563 with A/C systolic HF and sickle cell anemia. Diuresed with IV lasix and transitioned to torsemide 100 mg daily. Found to be back in Afib. Eliquis was continued. He was started on amio drip and underwent DCCV 12/19. Amio transitioned to PO. He required 1 uPRBCs for anemia. He also had chest pain with negative troponin and no EKG changes. Myoview showed no significant ischemia, EF 46%   Started HD in 05/2019.   Admitted 8/75 for acute systolic HF. He underwent an extra HD session and nephrology reduced his EDW goal. Volume improved.  Echo EF 35-40%, low normal RV function, severe pulm HTN in setting of volume overload with RVSP 58mmHG. Severe LAE, mod RAE, mod MR, severe TR. RV TAPSE 1.6, RV s' 10.  Successful DCCV 4/21 to NSR. Recurrent AFL in 5/21. Referred to AF Clinic. Felt that patient was not good candidate to restart amiodarone. Rate control strategy continued.    Echo 09/21 EF 40-45%, RV mildly reduced, RVSP 75 mmHg, severe TR   Admitted with upper GI bleed 02/22. Transfused with 1 u pRBCs. EGD with a few small  erosions with no recent bleeding in prepyloric area of stomach and patchy mildly erythematous mucosa in gastric body without bleeding.    Admitted to [facility] in [place] 11/22 with SBO treated with conservative management and choledocholithiasis s/p ERCP stone removal + stents to common bile duct and pancreatic duct. Patient declined cholecystectomy. Course complicated by acute on chronic anemia. Hemoglobin down to 5.7 and transfused with pRBCs. Hgb 7.9 at discharge.    Admitted to [facility] on 05/19/21 with recurrent SBO and gangrenous cholecystitis. S/p diagnostic laparoscopy and small bowel resection. Also had GIB, requiring ICU monitoring.  EGD 12/22 showed large amount of blood in the stomach and bleeding AVM. Treated endoscopically, and Eliquis restarted.  Readmitted 1/23 for upper GIB. EGD 1/23 showed Candida esophagitis, and gastritis with hemorrhage treated with APC. Eliquis discontinued this admission. Coreg also stopped with soft BPs.  Today he returns for post hospital HF follow up. Feels his legs are swelling some and dialysis not taking off as much fluid since hospitalization. He has not had low BP at dialysis requiring him to come off machine early. He remains on torsemide and makes urine. He does not have much SOB with ADLs and able to get around his house OK. Denies palpitations, abnormal bleeding, CP, dizziness, or PND/Orthopnea. Appetite ok. No fever or chills.  Taking all medications. He lives alone, uses [employer] for transportation. He is legally blind but can see shapes and shadows.   Echos 01/17/12 EF: 55%-60%  11/20/12 EF  40% Peak PA pressure 56 11/30/13 EF 40-45%  12/2015: EF 40%.  07/2017: EF 40-45% 05/2018: EF 25-30% 08/2019: EF 35-40%, low normal RV function, severe pulm HTN. 02/2020: EF 40-45%, RV mildly reduced, RVSP 75 mmHg, severe TR 12/22: EF EF 40-45%, RV mildly reduced, RVSP 55 mmHg, severe MR, severe TR  - Myoview showed no significant ischemia, EF 46%   SH: Lives  alone. Uses [employer]. Previously smoked 8 cigarettes per day but now quit. Occasional ETOH and no drugs  FH: Mother deceased: liver failure, no CAD        Father deceased: CAD, MI  Review of systems complete and found to be negative unless listed in HPI.   Past Medical History:  Diagnosis Date   Blood dyscrasia    Blood transfusion    "I've had a bunch of them"   CHF (congestive heart failure) ([place])    followed by hf clinic   Chronic kidney disease (CKD), stage III (moderate) ([place])    Dialysis patient ([place]) 04/2019   AV fistula (right arm)   Gout    Headache(784.0)    "probably weekly" (01/13/2014)   Legally blind    Shortness of breath dyspnea    Sickle cell disease, type SS (HCC)    Swelling abdomen    Swelling of extremity, left    Swelling of extremity, right    Current Outpatient Medications  Medication Sig Dispense Refill   Acetaminophen (TYLENOL 8 HOUR PO) Patient takes two 500 mg tablets as needed for pain.     albuterol (VENTOLIN HFA) 108 (90 Base) MCG/ACT inhaler Inhale 2 puffs into the lungs every 6 (six) hours as needed for wheezing or shortness of breath. 8 g 6   allopurinol (ZYLOPRIM) 100 MG tablet TAKE 1 TABLET BY MOUTH EVERY DAY 30 tablet 0   AURYXIA 1 GM 210 MG(Fe) tablet Take 420 mg by mouth 3 (three) times daily.     folic acid (FOLVITE) 1 MG tablet TAKE 1 TABLET BY MOUTH EVERY DAY 90 tablet 3   gabapentin (NEURONTIN) 100 MG capsule TAKE 1 CAPSULE BY MOUTH EVERYDAY AT BEDTIME 30 capsule 3   hydroxyurea (HYDREA) 500 MG capsule Take 1 capsule (500 mg total) by mouth daily. TAKE 1 CAPSULE BY MOUTH EVERY DAY WITH FOOD TO MINIMIZE GI SIDE EFFECTS 90 capsule 1   lidocaine-prilocaine (EMLA) cream Apply 1 application topically daily as needed (port access).      oxycodone (OXY-IR) 5 MG capsule Take 2 capsules (10 mg total) by mouth every 6 (six) hours as needed for pain. 60 capsule 0   pantoprazole (PROTONIX) 40 MG tablet Take 1 tablet (40 mg total) by mouth 2 (two) times  daily. 30 tablet 0   tiZANidine (ZANAFLEX) 4 MG tablet Take 0.5 tablets (2 mg total) by mouth every 8 (eight) hours as needed for muscle spasms. 15 tablet 1   torsemide (DEMADEX) 20 MG tablet Take 80 mg by mouth daily.     Vitamin D, Ergocalciferol, (DRISDOL) 1.25 MG (50000 UNIT) CAPS capsule TAKE 1 CAPSULE BY MOUTH ONE TIME PER WEEK (Patient taking differently: Take 50,000 Units by mouth once a week. Monday) 12 capsule 1   No current facility-administered medications for this encounter.   BP (!) 128/56    Pulse 100    Wt 64.7 kg (142 lb 9.6 oz)    SpO2 98%    BMI 19.89 kg/m   Wt Readings from Last 3 Encounters:  07/17/21 64.7 kg (142 lb 9.6 oz)  06/29/21 65.7 kg (144 lb 13.5 oz)  06/10/21 66.2 kg (145 lb 15.1 oz)   PHYSICAL EXAM: General:  NAD. No resp difficulty, arrived in [facility], chronically ill appearing. HEENT: + legally blind Neck: Supple. No JVD. Carotids 2+ bilat; no bruits. No lymphadenopathy or thryomegaly appreciated. Cor: PMI nondisplaced. Irregular rate & rhythm. No rubs, gallops or murmurs. Lungs: Clear Abdomen: Soft, nontender, nondistended. No hepatosplenomegaly. No bruits or masses. Abd dressing CDI Extremities: No cyanosis, clubbing, rash, trace BLE edema; RUE AVF +/+ Neuro: Alert & oriented x 3, cranial nerves grossly intact. Moves all 4 extremities w/o difficulty. Affect pleasant.  ECG: atrial fibrillation 96 bpm, QTc 429 msec(personally reviewed).  ASSESSMENT & PLAN: 1. Persistent Atrial Fibrillation/AFL - S/p DCCV 05/2018 - S/P DCCV 10/06/19 NSR - Recurrence 05/21. Has been seen in AF Clinic and rate control strategy recommended. - Not a Watchman candidate with dialysis-dependent ESRD and open wound. - HR 100 today, start carvedilol 3.125 mg bid.  - Off Eliquis (see #3).   2. Chronic combined Systolic/Diastolic Heart Failure:  - NICM. Cath 05/2011 without significant coronary disease.   - Echo 12/2015 40%.  - Echo 05/2018: EF 25-30%.  - Echo 3/21 LVEF 35-40%,  low normal RV function,severe pulm HTN. Severe LAE, mod RAE, mod MR, severe TR. RV TAPSE 1.6, RV s' 10 - Echo 9/21 EF 40-45%, RV mildly reduced, mild RAE, severe pulm artery systolic pressure, severe TR - Echo 12/22 suggestive of restrictive CM with EF 40-45%, RV mildly decreased, severe MR, severe TR, RVSP 55 mmHg - Volume status stable and managed with HD on T,T,S. - Continue torsemide 80 mg daily per Nephrology. - Start beta blocker as above. Hold before dialysis days.   3. Anemia with H/o GI bleeding: - EGD in 2019 showed nonbleeding gastritis, Colonoscopy showed small internal hemorrhoids, otherwise normal.  - GI bleed 02/22 - gastric erosions on EGD - EGD 12/22 with bleeding AVM in stomach treated with APC and 3 clips, nonbleeding ulcerative - EGD 1/23 + candida esophagitis, gastritis w/ hemorrhage, treated with APC - Continue PPI. - Eliquis discontinued. - He has GI follow up 07/19/21. - CBC followed at dialysis.    4. SBO/gangrenous cholecystitis - s/p lap, cholecystectomy and SB resection 05/24/21. - Has open abdominal wound with dressing changes daily. Dressing looks good today. - Has surgery follow up 07/21/21.    5. Pulmonary HTN  - Echo 09/21 with EF 40-45%, RV mildly reduced, RVSP 75 mmHg, severe TR - Echo 12/22 with EF 40-45%, RV mildly reduced, severe MR, severe TR, RVSP 55 mmHg   6. ESRD - On HD now T/T/S. - Continue torsemide per Nephrology.  7. HTN: - Stable - Add back beta blocker as above.   8.  Sickle Cell:   - Follows closely with Sickle Cell Clinic. - No recent crisis.   9. Abdominal ascites - Noted on CT. - Mild hepatic nodularity present. - No indication for paracentesis.  46. Former smoker:   - Quit 11/2015.   Keep follow up with Dr. Haroldine Laws in 3 months.   [place], [place]  07/17/2021

## 2021-07-17 NOTE — Addendum Note (Signed)
Encounter addended by: Rafael Bihari, FNP on: 07/17/2021 5:50 PM  Actions taken: Clinical Note Signed

## 2021-07-18 ENCOUNTER — Other Ambulatory Visit: Payer: Self-pay | Admitting: Family Medicine

## 2021-07-18 DIAGNOSIS — D57819 Other sickle-cell disorders with crisis, unspecified: Secondary | ICD-10-CM | POA: Diagnosis not present

## 2021-07-18 DIAGNOSIS — N186 End stage renal disease: Secondary | ICD-10-CM | POA: Diagnosis not present

## 2021-07-18 DIAGNOSIS — D571 Sickle-cell disease without crisis: Secondary | ICD-10-CM | POA: Diagnosis not present

## 2021-07-18 DIAGNOSIS — D57219 Sickle-cell/Hb-C disease with crisis, unspecified: Secondary | ICD-10-CM | POA: Diagnosis not present

## 2021-07-18 DIAGNOSIS — M545 Low back pain, unspecified: Secondary | ICD-10-CM

## 2021-07-18 DIAGNOSIS — N2581 Secondary hyperparathyroidism of renal origin: Secondary | ICD-10-CM | POA: Diagnosis not present

## 2021-07-18 DIAGNOSIS — I158 Other secondary hypertension: Secondary | ICD-10-CM | POA: Diagnosis not present

## 2021-07-18 DIAGNOSIS — Z992 Dependence on renal dialysis: Secondary | ICD-10-CM | POA: Diagnosis not present

## 2021-07-18 MED ORDER — TIZANIDINE HCL 4 MG PO TABS: 2.0000 mg | ORAL_TABLET | Freq: Three times a day (TID) | ORAL | 1 refills | Status: DC | PRN

## 2021-07-19 ENCOUNTER — Encounter: Payer: Self-pay | Admitting: Nurse Practitioner

## 2021-07-19 ENCOUNTER — Ambulatory Visit: Payer: HMO | Admitting: Nurse Practitioner

## 2021-07-19 VITALS — BP 120/60 | HR 74 | Ht 71.0 in | Wt 138.6 lb

## 2021-07-19 DIAGNOSIS — K2901 Acute gastritis with bleeding: Secondary | ICD-10-CM

## 2021-07-19 DIAGNOSIS — D500 Iron deficiency anemia secondary to blood loss (chronic): Secondary | ICD-10-CM

## 2021-07-19 MED ORDER — PANTOPRAZOLE SODIUM 40 MG PO TBEC: 40.0000 mg | DELAYED_RELEASE_TABLET | Freq: Two times a day (BID) | ORAL | 3 refills | Status: DC

## 2021-07-19 NOTE — Patient Instructions (Addendum)
If you are age 64 or younger, your body mass index should be between 19-25. Your Body mass index is 19.33 kg/m. If this is out of the aformentioned range listed, please consider follow up with your Primary Care Provider.  ________________________________________________________  The [facility] providers would like to encourage you to use [facility] to communicate with providers for non-urgent requests or questions.  Due to long hold times on the telephone, sending your provider a message by [facility] may be a faster and more efficient way to get a response.  Please allow 48 business hours for a response.  Please remember that this is for non-urgent requests.  _______________________________________________________  Continue your Pantoprazole twice daily. *We have sent refills to your pharmacy.  You have been scheduled to follow up with Dr. Lyndel Safe on September 15, 2021 at 11:20 am. Dr Lyndel Safe is located at the [place] [address].  Thank you for entrusting me with your care and choosing [facility].  Tye Savoy, NP-C

## 2021-07-19 NOTE — Progress Notes (Signed)
ASSESSMENT AND PLAN    # 66 yo male with ESRD on HD and sickle cell disease who has had multiple admission for acute on chronic anemia and GI bleeding on Eliquis. Most recently found to have hemorrhagic gastritis (06/28/21). Eliquis has been stopped.  No further bleeding. He tells me stool is brown, even on oral iron. --He never did get the Rx for Carafate but was only going to take it for 1-2 weeks anyway given ESRD --He has been taking BID PPI. Recommend he continue BID PPI until time of next follow up in late March. Maybe at that point if there is no evidence for bleeding then PPI can be reduced to once daily  --No NSAIDs He takes Tylenol   # Choledocholithiasis s/p ERCP with stone removal / sphincterotomy / CBD and PD stent placement in [place]. Stents removed during EGD (for GI bleeding) while hospitalized in [place] December 2022.   # Recent small bowel resection for SBO, and open cholecystectomy. He has a post-op follow up with [facility] soon.   HISTORY OF PRESENT ILLNESS    Chief Complaint : hospital follow up   Patrick [name] is a 66 y.o. male with a past medical history not limited to gout,  ESRD on HD T/TH/Sa, CHF, A-fib on Eliquis, sickle cell disease, pulmonary hypertension, legally blind, chronic anemia, gastric AVMs, choledocholithiasis status post stone extraction/sphincterotomy, cholecystectomy, recurrent SBOs status post small bowel resection December 2022  We have seen patient in the hospital several times since 2019 for evaluation of Patient acute on chronic anemia / GI bleeding requiring multiple blood transfusions.  Upper endoscopies have shown severe ulcerative esophagitis , erosive gastritis, oozing gastric AVM. Colonoscopy in 2019 was unrevealing. A couple of months back he was hospitalized in [place] ( [place]) with choledocholithiasis. He underwent ERCP with sphincterotomy / removal of CBD stones / CBD and PD stenting.  He declined cholecystectomy. Plan was for repeat  ERCP with stent removal in 3 weeks However he was subsequently admitted in [place] .   Early December 2022 admitted in [place] with a recurrent SBO.  He underwent small bowel resection and cholecystectomy on 05/24/21.  A few days  post op he had bloody drainage in NGT. CTA of abd / pelvis negative for active bleeding. Transfused blood for acute on chronic anemia. No evidence for cirrhosis on CT scan. Hgb didn't improve with blood so he underwent EGD which showed an actively bleeding gastric AVM which we treated with good results.    06/27/21 readmitted with weakness, hypotension, and recurrent acute on chronic anemia.  Hemoglobin 4.7 down from 7.8 on 12/22.  CTAP showed moderate to large volume ascites compared to a few weeks earlier.  Inpatient EGD remarkable for hemorrhagic gastritis. Focally treated with argon plasma coagulation (APC) but gastritis was too diffuse for targeted ablation.  Biopsies c/w reactive gastropathy with surface erosion. He was given 1-2 weeks of Carafate, BID PPI. There were discussions about stopping Eliquis indefinitely   LABORATORY DATA  Hepatic Function Latest Ref Rng & Units 06/27/2021 06/08/2021 06/06/2021  Total Protein 6.5 - 8.1 g/dL 6.1(L) - -  Albumin 3.5 - 5.0 g/dL <1.5(L) 1.7(L) 1.6(L)  AST 15 - 41 U/L 23 - -  ALT 0 - 44 U/L 14 - -  Alk Phosphatase 38 - 126 U/L 563(H) - -  Total Bilirubin 0.3 - 1.2 mg/dL 3.0(H) - -  Bilirubin, Direct 0.0 - 0.2 mg/dL - - -    CBC Latest Ref Rng &  Units 06/29/2021 06/28/2021 06/28/2021  WBC 4.0 - 10.5 K/uL 8.6 9.0 10.0  Hemoglobin 13.0 - 17.0 g/dL 7.5(L) 7.8(L) 7.3(L)  Hematocrit 39.0 - 52.0 % 21.5(L) 22.2(L) 21.5(L)  Platelets 150 - 400 K/uL 228 232 235     PREVIOUS ENDOSCOPIC EVALUATIONS     09/2017 colonoscopy for IDA, unexplained GI bleeding.  Small, nonbleeding internal hemorrhoids otherwise normal.  09/2017 EGD.  Erosive gastritis, benign biopsies 07/2020 EGD.  For blood loss anemia, hematemesis.  Ectopic gastric mucosa  in proximal esophagus.  Nonbleeding erosive gastropathy, biopsied.  Gastric erythema, biopsied (benign, no H. Pylori).  Small HH.  Normal duodenal bulb and second duodenum 11/ 26/2022 ERCP at [facility].  Had SBO associated with choledocholithiasis.  Underwent sphincterotomy, clearance debris from CBD.  PD and CBD stent placed. Recurrent SBO.  05/24/2021 diagnostic laparoscopy, small bowel resection with primary anastomosis, open cholecystectomy.  Bilious ascites noted throughout the abdomen.  There was a segment of small bowel adhered to pelvis that was dilated, friable and looked like chronic ischemia.  GB looked like acute cholecystitis.  SP biopsy showed fibrosis, stricture.  GB pathology consistent with acute/chronic cholecystitis, cholelithiasis. 05/29/2021 EGD for melena.  Old blood clot and fresh blood in stomach precluding visualization of proximal stomach.  Grade C ulcerative esophagitis.  Active oozing from AVM at lesser curvature of stomach began to breed briskly, treated with APC, 3 endoclips with some continued small oozing so epinephrine injected.  Plastic stent from biliary tract and pancreatic duct removed 06/28/21 EGD for anemia. Esophageal plaques were found, consistent with candidiasis. LA Grade B reflux esophagitis with no bleeding. Non-obstructing Schatzki ring. A 3 cm hiatal hernia. Gastritis with hemorrhage. Focally, treated with argon plasma coagulation (APC). Gastritis is too diffuse for targeted ablation. Biopsied to exclude H. Pylori. Previously placed endoclips were found in the stomach.Normal examined duodenum. FINAL MICROSCOPIC DIAGNOSIS:  A. STOMACH, BODY AND ANTRUM, BIOPSY:  Reactive gastropathy with focal activity and surface erosion  Negative for H. pylori, intestinal metaplasia, dysplasia and carcinoma    Past Medical History:  Diagnosis Date   Blood dyscrasia    Blood transfusion    "I've had a bunch of them"   CHF (congestive heart failure) ([place])     followed by hf clinic   Chronic kidney disease (CKD), stage III (moderate) ([place])    Dialysis patient ([place]) 04/2019   AV fistula (right arm)   Gout    Headache(784.0)    "probably weekly" (01/13/2014)   Legally blind    Shortness of breath dyspnea    Sickle cell disease, type SS (HCC)    Swelling abdomen    Swelling of extremity, left    Swelling of extremity, right     Past Surgical History:  Procedure Laterality Date   A/V FISTULAGRAM Right 07/29/2020   Procedure: A/V FISTULAGRAM - Right Arm;  Surgeon: Angelia Mould, MD;  Location: [place] CV LAB;  Service: Cardiovascular;  Laterality: Right;   BASCILIC VEIN TRANSPOSITION Right 04/12/2014   Procedure: BASILIC VEIN TRANSPOSITION;  Surgeon: Elam Dutch, MD;  Location: [place];  Service: Vascular;  Laterality: Right;   BIOPSY  08/04/2020   Procedure: BIOPSY;  Surgeon: Ladene Artist, MD;  Location: [place];  Service: Gastroenterology;;   BIOPSY  06/28/2021   Procedure: BIOPSY;  Surgeon: Jerene Bears, MD;  Location: [facility] ENDOSCOPY;  Service: Gastroenterology;;   BOWEL RESECTION N/A 05/24/2021   Procedure: SMALL BOWEL RESECTION;  Surgeon: Rolm Bookbinder, MD;  Location: [place];  Service: General;  Laterality: N/A;   CARDIAC CATHETERIZATION  05/2011   CARDIOVERSION N/A 06/05/2018   Procedure: CARDIOVERSION;  Surgeon: Jolaine Artist, MD;  Location: [place];  Service: Cardiovascular;  Laterality: N/A;   CARDIOVERSION N/A 10/06/2019   Procedure: CARDIOVERSION;  Surgeon: Jolaine Artist, MD;  Location: [facility] ENDOSCOPY;  Service: Cardiovascular;  Laterality: N/A;   CHOLECYSTECTOMY N/A 05/24/2021   Procedure: OPEN CHOLECYSTECTOMY;  Surgeon: Rolm Bookbinder, MD;  Location: [place];  Service: General;  Laterality: N/A;   COLONOSCOPY WITH PROPOFOL N/A 09/24/2017   Procedure: COLONOSCOPY WITH PROPOFOL;  Surgeon: Jackquline Denmark, MD;  Location: [facility] ENDOSCOPY;  Service: Endoscopy;  Laterality: N/A;    ESOPHAGOGASTRODUODENOSCOPY N/A 09/19/2017   Procedure: ESOPHAGOGASTRODUODENOSCOPY (EGD);  Surgeon: Jackquline Denmark, MD;  Location: [facility] ENDOSCOPY;  Service: Endoscopy;  Laterality: N/A;   ESOPHAGOGASTRODUODENOSCOPY N/A 05/29/2021   Procedure: ESOPHAGOGASTRODUODENOSCOPY (EGD);  Surgeon: Milus Banister, MD;  Location: [facility]OSCOPY;  Service: Gastroenterology;  Laterality: N/A;   ESOPHAGOGASTRODUODENOSCOPY (EGD) WITH PROPOFOL N/A 08/04/2020   Procedure: ESOPHAGOGASTRODUODENOSCOPY (EGD) WITH PROPOFOL;  Surgeon: Ladene Artist, MD;  Location: [place];  Service: Gastroenterology;  Laterality: N/A;   ESOPHAGOGASTRODUODENOSCOPY (EGD) WITH PROPOFOL N/A 06/28/2021   Procedure: ESOPHAGOGASTRODUODENOSCOPY (EGD) WITH PROPOFOL;  Surgeon: Jerene Bears, MD;  Location: [facility] ENDOSCOPY;  Service: Gastroenterology;  Laterality: N/A;   HEMOSTASIS CLIP PLACEMENT  05/29/2021   Procedure: HEMOSTASIS CLIP PLACEMENT;  Surgeon: Milus Banister, MD;  Location: [facility] ENDOSCOPY;  Service: Gastroenterology;;   HOT HEMOSTASIS N/A 05/29/2021   Procedure: HOT HEMOSTASIS (ARGON PLASMA COAGULATION/BICAP);  Surgeon: Milus Banister, MD;  Location: [facility] ENDOSCOPY;  Service: Gastroenterology;  Laterality: N/A;   HOT HEMOSTASIS N/A 06/28/2021   Procedure: HOT HEMOSTASIS (ARGON PLASMA COAGULATION/BICAP);  Surgeon: Jerene Bears, MD;  Location: [facility] ENDOSCOPY;  Service: Gastroenterology;  Laterality: N/A;   IR PERC TUN PERIT CATH WO PORT S&I /IMAG  05/23/2021   IR REMOVAL TUN CV CATH W/O FL  06/10/2021   IR US GUIDE VASC ACCESS RIGHT  05/23/2021   LAPAROSCOPY N/A 05/24/2021   Procedure: LAPAROSCOPY DIAGNOSTIC;  Surgeon: Rolm Bookbinder, MD;  Location: [place];  Service: General;  Laterality: N/A;   LAPAROTOMY N/A 05/24/2021   Procedure: EXPLORATORY LAPAROTOMY;  Surgeon: Rolm Bookbinder, MD;  Location: [place];  Service: General;  Laterality: N/A;   LEFT AND RIGHT HEART CATHETERIZATION WITH CORONARY ANGIOGRAM N/A 06/11/2011   Procedure: LEFT  AND RIGHT HEART CATHETERIZATION WITH CORONARY ANGIOGRAM;  Surgeon: Larey Dresser, MD;  Location: [facility] CATH LAB;  Service: Cardiovascular;  Laterality: N/A;   PERIPHERAL VASCULAR BALLOON ANGIOPLASTY Right 07/29/2020   Procedure: PERIPHERAL VASCULAR BALLOON ANGIOPLASTY;  Surgeon: Angelia Mould, MD;  Location: [place] CV LAB;  Service: Cardiovascular;  Laterality: Right;  arm fistula   REVISON OF ARTERIOVENOUS FISTULA Right 08/08/2020   Procedure: ANEURYSM EXCISION OF RIGHT UPPER EXTREMITY ARTERIOVENOUS FISTULA;  Surgeon: Angelia Mould, MD;  Location: [facility] OR;  Service: Vascular;  Laterality: Right;   SCLEROTHERAPY  05/29/2021   Procedure: Clide Deutscher;  Surgeon: Milus Banister, MD;  Location: [facility] ENDOSCOPY;  Service: Gastroenterology;;   Lavell Islam REMOVAL  05/29/2021   Procedure: STENT REMOVAL;  Surgeon: Milus Banister, MD;  Location: [facility] ENDOSCOPY;  Service: Gastroenterology;;   TEE WITHOUT CARDIOVERSION N/A 12/27/2015   Procedure: TRANSESOPHAGEAL ECHOCARDIOGRAM (TEE);  Surgeon: Sueanne Margarita, MD;  Location: [facility] ENDOSCOPY;  Service: Cardiovascular;  Laterality: N/A;      Current Medications, Allergies, Family History and Social History were reviewed  in [employer] record.     Current Outpatient Medications  Medication Sig Dispense Refill   Acetaminophen (TYLENOL 8 HOUR PO) Patient takes two 500 mg tablets as needed for pain.     albuterol (VENTOLIN HFA) 108 (90 Base) MCG/ACT inhaler Inhale 2 puffs into the lungs every 6 (six) hours as needed for wheezing or shortness of breath. 8 g 6   allopurinol (ZYLOPRIM) 100 MG tablet TAKE 1 TABLET BY MOUTH EVERY DAY 30 tablet 0   AURYXIA 1 GM 210 MG(Fe) tablet Take 420 mg by mouth 3 (three) times daily.     carvedilol (COREG) 3.125 MG tablet Take 1 tablet (3.125 mg total) by mouth 2 (two) times daily. 774 tablet 3   folic acid (FOLVITE) 1 MG tablet TAKE 1 TABLET BY MOUTH EVERY DAY 90 tablet 3   gabapentin  (NEURONTIN) 100 MG capsule TAKE 1 CAPSULE BY MOUTH EVERYDAY AT BEDTIME 30 capsule 3   hydroxyurea (HYDREA) 500 MG capsule Take 1 capsule (500 mg total) by mouth daily. TAKE 1 CAPSULE BY MOUTH EVERY DAY WITH FOOD TO MINIMIZE GI SIDE EFFECTS 90 capsule 1   lidocaine-prilocaine (EMLA) cream Apply 1 application topically daily as needed (port access).      oxycodone (OXY-IR) 5 MG capsule Take 2 capsules (10 mg total) by mouth every 6 (six) hours as needed for pain. 60 capsule 0   pantoprazole (PROTONIX) 40 MG tablet Take 1 tablet (40 mg total) by mouth 2 (two) times daily. 30 tablet 0   tiZANidine (ZANAFLEX) 4 MG tablet Take 0.5 tablets (2 mg total) by mouth every 8 (eight) hours as needed for muscle spasms. 15 tablet 1   torsemide (DEMADEX) 20 MG tablet Take 80 mg by mouth daily.     Vitamin D, Ergocalciferol, (DRISDOL) 1.25 MG (50000 UNIT) CAPS capsule TAKE 1 CAPSULE BY MOUTH ONE TIME PER WEEK (Patient taking differently: Take 50,000 Units by mouth once a week. Monday) 12 capsule 1   No current facility-administered medications for this visit.    Review of Systems: No chest pain. No shortness of breath. No urinary complaints.   PHYSICAL EXAM :    Wt Readings from Last 3 Encounters:  07/17/21 142 lb 9.6 oz (64.7 kg)  06/29/21 144 lb 13.5 oz (65.7 kg)  06/10/21 145 lb 15.1 oz (66.2 kg)    BP 120/60    Pulse 74    Ht _0  (1.803 m)    Wt 138 lb 9.6 oz (62.9 kg)    BMI 19.33 kg/m  Constitutional:  Generally well appearing thin male in no acute distress. Psychiatric: Pleasant. Normal mood and affect. Behavior is normal. EENT: Pupils normal.  Conjunctivae are normal. No scleral icterus. Neck supple.  Cardiovascular: Normal rate, regular rhythm. No edema Pulmonary/chest: Effort normal and breath sounds normal. No wheezing, rales or rhonchi. Abdominal: Soft, nondistended, nontender. Bowel sounds active throughout. There are no masses palpable. No hepatomegaly. Neurological: Alert and oriented  to person place and time. Skin: Skin is warm and dry. No rashes noted.  Tye Savoy, NP  07/19/2021, 10:15 AM

## 2021-07-20 DIAGNOSIS — D57219 Sickle-cell/Hb-C disease with crisis, unspecified: Secondary | ICD-10-CM | POA: Diagnosis not present

## 2021-07-20 DIAGNOSIS — Z992 Dependence on renal dialysis: Secondary | ICD-10-CM | POA: Diagnosis not present

## 2021-07-20 DIAGNOSIS — N2581 Secondary hyperparathyroidism of renal origin: Secondary | ICD-10-CM | POA: Diagnosis not present

## 2021-07-20 DIAGNOSIS — N186 End stage renal disease: Secondary | ICD-10-CM | POA: Diagnosis not present

## 2021-07-20 DIAGNOSIS — D57819 Other sickle-cell disorders with crisis, unspecified: Secondary | ICD-10-CM | POA: Diagnosis not present

## 2021-07-20 DIAGNOSIS — D571 Sickle-cell disease without crisis: Secondary | ICD-10-CM | POA: Diagnosis not present

## 2021-07-20 DIAGNOSIS — D631 Anemia in chronic kidney disease: Secondary | ICD-10-CM | POA: Diagnosis not present

## 2021-07-21 DIAGNOSIS — K2100 Gastro-esophageal reflux disease with esophagitis, without bleeding: Secondary | ICD-10-CM | POA: Diagnosis not present

## 2021-07-21 DIAGNOSIS — Z79891 Long term (current) use of opiate analgesic: Secondary | ICD-10-CM | POA: Diagnosis not present

## 2021-07-21 DIAGNOSIS — Z87891 Personal history of nicotine dependence: Secondary | ICD-10-CM | POA: Diagnosis not present

## 2021-07-21 DIAGNOSIS — I5022 Chronic systolic (congestive) heart failure: Secondary | ICD-10-CM | POA: Diagnosis not present

## 2021-07-21 DIAGNOSIS — D500 Iron deficiency anemia secondary to blood loss (chronic): Secondary | ICD-10-CM | POA: Diagnosis not present

## 2021-07-21 DIAGNOSIS — I132 Hypertensive heart and chronic kidney disease with heart failure and with stage 5 chronic kidney disease, or end stage renal disease: Secondary | ICD-10-CM | POA: Diagnosis not present

## 2021-07-21 DIAGNOSIS — Z48815 Encounter for surgical aftercare following surgery on the digestive system: Secondary | ICD-10-CM | POA: Diagnosis not present

## 2021-07-21 DIAGNOSIS — I4821 Permanent atrial fibrillation: Secondary | ICD-10-CM | POA: Diagnosis not present

## 2021-07-21 DIAGNOSIS — Z4801 Encounter for change or removal of surgical wound dressing: Secondary | ICD-10-CM | POA: Diagnosis not present

## 2021-07-21 DIAGNOSIS — N186 End stage renal disease: Secondary | ICD-10-CM | POA: Diagnosis not present

## 2021-07-21 DIAGNOSIS — M109 Gout, unspecified: Secondary | ICD-10-CM | POA: Diagnosis not present

## 2021-07-21 DIAGNOSIS — J449 Chronic obstructive pulmonary disease, unspecified: Secondary | ICD-10-CM | POA: Diagnosis not present

## 2021-07-21 DIAGNOSIS — Z992 Dependence on renal dialysis: Secondary | ICD-10-CM | POA: Diagnosis not present

## 2021-07-21 DIAGNOSIS — I272 Pulmonary hypertension, unspecified: Secondary | ICD-10-CM | POA: Diagnosis not present

## 2021-07-21 DIAGNOSIS — Z7901 Long term (current) use of anticoagulants: Secondary | ICD-10-CM | POA: Diagnosis not present

## 2021-07-21 DIAGNOSIS — D571 Sickle-cell disease without crisis: Secondary | ICD-10-CM | POA: Diagnosis not present

## 2021-07-21 DIAGNOSIS — H548 Legal blindness, as defined in USA: Secondary | ICD-10-CM | POA: Diagnosis not present

## 2021-07-25 DIAGNOSIS — D57219 Sickle-cell/Hb-C disease with crisis, unspecified: Secondary | ICD-10-CM | POA: Diagnosis not present

## 2021-07-25 DIAGNOSIS — Z992 Dependence on renal dialysis: Secondary | ICD-10-CM | POA: Diagnosis not present

## 2021-07-25 DIAGNOSIS — D631 Anemia in chronic kidney disease: Secondary | ICD-10-CM | POA: Diagnosis not present

## 2021-07-25 DIAGNOSIS — D57819 Other sickle-cell disorders with crisis, unspecified: Secondary | ICD-10-CM | POA: Diagnosis not present

## 2021-07-25 DIAGNOSIS — D571 Sickle-cell disease without crisis: Secondary | ICD-10-CM | POA: Diagnosis not present

## 2021-07-25 DIAGNOSIS — N186 End stage renal disease: Secondary | ICD-10-CM | POA: Diagnosis not present

## 2021-07-25 DIAGNOSIS — N2581 Secondary hyperparathyroidism of renal origin: Secondary | ICD-10-CM | POA: Diagnosis not present

## 2021-07-26 ENCOUNTER — Other Ambulatory Visit: Payer: Self-pay | Admitting: Nurse Practitioner

## 2021-07-28 ENCOUNTER — Telehealth: Payer: Self-pay | Admitting: Family Medicine

## 2021-07-28 DIAGNOSIS — I5022 Chronic systolic (congestive) heart failure: Secondary | ICD-10-CM | POA: Diagnosis not present

## 2021-07-28 DIAGNOSIS — H548 Legal blindness, as defined in USA: Secondary | ICD-10-CM | POA: Diagnosis not present

## 2021-07-28 DIAGNOSIS — M109 Gout, unspecified: Secondary | ICD-10-CM | POA: Diagnosis not present

## 2021-07-28 DIAGNOSIS — Z48815 Encounter for surgical aftercare following surgery on the digestive system: Secondary | ICD-10-CM | POA: Diagnosis not present

## 2021-07-28 DIAGNOSIS — Z992 Dependence on renal dialysis: Secondary | ICD-10-CM | POA: Diagnosis not present

## 2021-07-28 DIAGNOSIS — J449 Chronic obstructive pulmonary disease, unspecified: Secondary | ICD-10-CM | POA: Diagnosis not present

## 2021-07-28 DIAGNOSIS — K2100 Gastro-esophageal reflux disease with esophagitis, without bleeding: Secondary | ICD-10-CM | POA: Diagnosis not present

## 2021-07-28 DIAGNOSIS — I4821 Permanent atrial fibrillation: Secondary | ICD-10-CM | POA: Diagnosis not present

## 2021-07-28 DIAGNOSIS — I272 Pulmonary hypertension, unspecified: Secondary | ICD-10-CM | POA: Diagnosis not present

## 2021-07-28 DIAGNOSIS — Z4801 Encounter for change or removal of surgical wound dressing: Secondary | ICD-10-CM | POA: Diagnosis not present

## 2021-07-28 DIAGNOSIS — Z87891 Personal history of nicotine dependence: Secondary | ICD-10-CM | POA: Diagnosis not present

## 2021-07-28 DIAGNOSIS — Z79891 Long term (current) use of opiate analgesic: Secondary | ICD-10-CM | POA: Diagnosis not present

## 2021-07-28 DIAGNOSIS — Z7901 Long term (current) use of anticoagulants: Secondary | ICD-10-CM | POA: Diagnosis not present

## 2021-07-28 DIAGNOSIS — I132 Hypertensive heart and chronic kidney disease with heart failure and with stage 5 chronic kidney disease, or end stage renal disease: Secondary | ICD-10-CM | POA: Diagnosis not present

## 2021-07-28 DIAGNOSIS — N186 End stage renal disease: Secondary | ICD-10-CM | POA: Diagnosis not present

## 2021-07-28 DIAGNOSIS — D500 Iron deficiency anemia secondary to blood loss (chronic): Secondary | ICD-10-CM | POA: Diagnosis not present

## 2021-07-28 DIAGNOSIS — D571 Sickle-cell disease without crisis: Secondary | ICD-10-CM | POA: Diagnosis not present

## 2021-07-28 NOTE — Telephone Encounter (Signed)
Patrick [name] at [facility] requested a return phone call (left VM on nurse line) [phone]

## 2021-07-31 ENCOUNTER — Encounter (HOSPITAL_COMMUNITY): Payer: Self-pay

## 2021-07-31 ENCOUNTER — Other Ambulatory Visit: Payer: Self-pay

## 2021-07-31 ENCOUNTER — Telehealth: Payer: Self-pay

## 2021-07-31 ENCOUNTER — Telehealth: Payer: Self-pay | Admitting: Nephrology

## 2021-07-31 ENCOUNTER — Inpatient Hospital Stay (HOSPITAL_COMMUNITY)
Admission: EM | Admit: 2021-07-31 | Discharge: 2021-08-11 | DRG: 377 | Disposition: A | Discharge status: Home Health | Payer: HMO | Attending: Internal Medicine | Admitting: Internal Medicine

## 2021-07-31 ENCOUNTER — Other Ambulatory Visit: Payer: Self-pay | Admitting: Family Medicine

## 2021-07-31 DIAGNOSIS — K56609 Unspecified intestinal obstruction, unspecified as to partial versus complete obstruction: Secondary | ICD-10-CM | POA: Diagnosis not present

## 2021-07-31 DIAGNOSIS — K31811 Angiodysplasia of stomach and duodenum with bleeding: Principal | ICD-10-CM | POA: Diagnosis present

## 2021-07-31 DIAGNOSIS — Z9049 Acquired absence of other specified parts of digestive tract: Secondary | ICD-10-CM | POA: Diagnosis not present

## 2021-07-31 DIAGNOSIS — K31819 Angiodysplasia of stomach and duodenum without bleeding: Secondary | ICD-10-CM

## 2021-07-31 DIAGNOSIS — Z9115 Patient's noncompliance with renal dialysis: Secondary | ICD-10-CM

## 2021-07-31 DIAGNOSIS — Z681 Body mass index (BMI) 19 or less, adult: Secondary | ICD-10-CM | POA: Diagnosis not present

## 2021-07-31 DIAGNOSIS — K6389 Other specified diseases of intestine: Secondary | ICD-10-CM | POA: Diagnosis not present

## 2021-07-31 DIAGNOSIS — Z978 Presence of other specified devices: Secondary | ICD-10-CM | POA: Diagnosis not present

## 2021-07-31 DIAGNOSIS — I5022 Chronic systolic (congestive) heart failure: Secondary | ICD-10-CM | POA: Diagnosis present

## 2021-07-31 DIAGNOSIS — I4821 Permanent atrial fibrillation: Secondary | ICD-10-CM | POA: Diagnosis not present

## 2021-07-31 DIAGNOSIS — I132 Hypertensive heart and chronic kidney disease with heart failure and with stage 5 chronic kidney disease, or end stage renal disease: Secondary | ICD-10-CM | POA: Diagnosis present

## 2021-07-31 DIAGNOSIS — H548 Legal blindness, as defined in USA: Secondary | ICD-10-CM | POA: Diagnosis present

## 2021-07-31 DIAGNOSIS — R0602 Shortness of breath: Secondary | ICD-10-CM | POA: Diagnosis present

## 2021-07-31 DIAGNOSIS — K921 Melena: Secondary | ICD-10-CM | POA: Diagnosis not present

## 2021-07-31 DIAGNOSIS — K56699 Other intestinal obstruction unspecified as to partial versus complete obstruction: Secondary | ICD-10-CM | POA: Diagnosis not present

## 2021-07-31 DIAGNOSIS — I4891 Unspecified atrial fibrillation: Secondary | ICD-10-CM

## 2021-07-31 DIAGNOSIS — I10 Essential (primary) hypertension: Secondary | ICD-10-CM | POA: Diagnosis present

## 2021-07-31 DIAGNOSIS — R188 Other ascites: Secondary | ICD-10-CM

## 2021-07-31 DIAGNOSIS — E43 Unspecified severe protein-calorie malnutrition: Secondary | ICD-10-CM | POA: Diagnosis not present

## 2021-07-31 DIAGNOSIS — K222 Esophageal obstruction: Secondary | ICD-10-CM | POA: Diagnosis present

## 2021-07-31 DIAGNOSIS — D649 Anemia, unspecified: Secondary | ICD-10-CM | POA: Diagnosis not present

## 2021-07-31 DIAGNOSIS — Y848 Other medical procedures as the cause of abnormal reaction of the patient, or of later complication, without mention of misadventure at the time of the procedure: Secondary | ICD-10-CM | POA: Diagnosis not present

## 2021-07-31 DIAGNOSIS — D509 Iron deficiency anemia, unspecified: Secondary | ICD-10-CM | POA: Diagnosis not present

## 2021-07-31 DIAGNOSIS — I953 Hypotension of hemodialysis: Secondary | ICD-10-CM | POA: Diagnosis not present

## 2021-07-31 DIAGNOSIS — K209 Esophagitis, unspecified without bleeding: Secondary | ICD-10-CM | POA: Diagnosis not present

## 2021-07-31 DIAGNOSIS — L8990 Pressure ulcer of unspecified site, unspecified stage: Secondary | ICD-10-CM | POA: Insufficient documentation

## 2021-07-31 DIAGNOSIS — I509 Heart failure, unspecified: Secondary | ICD-10-CM | POA: Diagnosis not present

## 2021-07-31 DIAGNOSIS — L89152 Pressure ulcer of sacral region, stage 2: Secondary | ICD-10-CM | POA: Diagnosis present

## 2021-07-31 DIAGNOSIS — M545 Low back pain, unspecified: Secondary | ICD-10-CM | POA: Diagnosis not present

## 2021-07-31 DIAGNOSIS — Z20822 Contact with and (suspected) exposure to covid-19: Secondary | ICD-10-CM | POA: Diagnosis not present

## 2021-07-31 DIAGNOSIS — D571 Sickle-cell disease without crisis: Secondary | ICD-10-CM | POA: Diagnosis not present

## 2021-07-31 DIAGNOSIS — E871 Hypo-osmolality and hyponatremia: Secondary | ICD-10-CM | POA: Diagnosis not present

## 2021-07-31 DIAGNOSIS — N2581 Secondary hyperparathyroidism of renal origin: Secondary | ICD-10-CM | POA: Diagnosis present

## 2021-07-31 DIAGNOSIS — I272 Pulmonary hypertension, unspecified: Secondary | ICD-10-CM | POA: Diagnosis not present

## 2021-07-31 DIAGNOSIS — Z4659 Encounter for fitting and adjustment of other gastrointestinal appliance and device: Secondary | ICD-10-CM | POA: Diagnosis not present

## 2021-07-31 DIAGNOSIS — D631 Anemia in chronic kidney disease: Secondary | ICD-10-CM | POA: Diagnosis present

## 2021-07-31 DIAGNOSIS — D62 Acute posthemorrhagic anemia: Secondary | ICD-10-CM

## 2021-07-31 DIAGNOSIS — N186 End stage renal disease: Secondary | ICD-10-CM | POA: Diagnosis not present

## 2021-07-31 DIAGNOSIS — I120 Hypertensive chronic kidney disease with stage 5 chronic kidney disease or end stage renal disease: Secondary | ICD-10-CM | POA: Diagnosis not present

## 2021-07-31 DIAGNOSIS — E876 Hypokalemia: Secondary | ICD-10-CM | POA: Diagnosis present

## 2021-07-31 DIAGNOSIS — Z79899 Other long term (current) drug therapy: Secondary | ICD-10-CM

## 2021-07-31 DIAGNOSIS — K6811 Postprocedural retroperitoneal abscess: Secondary | ICD-10-CM | POA: Diagnosis not present

## 2021-07-31 DIAGNOSIS — T8143XA Infection following a procedure, organ and space surgical site, initial encounter: Secondary | ICD-10-CM | POA: Diagnosis not present

## 2021-07-31 DIAGNOSIS — Z992 Dependence on renal dialysis: Secondary | ICD-10-CM | POA: Diagnosis not present

## 2021-07-31 DIAGNOSIS — N281 Cyst of kidney, acquired: Secondary | ICD-10-CM | POA: Diagnosis not present

## 2021-07-31 DIAGNOSIS — I110 Hypertensive heart disease with heart failure: Secondary | ICD-10-CM | POA: Diagnosis not present

## 2021-07-31 DIAGNOSIS — G894 Chronic pain syndrome: Secondary | ICD-10-CM

## 2021-07-31 DIAGNOSIS — K651 Peritoneal abscess: Secondary | ICD-10-CM | POA: Diagnosis not present

## 2021-07-31 DIAGNOSIS — B9620 Unspecified Escherichia coli [E. coli] as the cause of diseases classified elsewhere: Secondary | ICD-10-CM | POA: Diagnosis not present

## 2021-07-31 DIAGNOSIS — Z8249 Family history of ischemic heart disease and other diseases of the circulatory system: Secondary | ICD-10-CM

## 2021-07-31 DIAGNOSIS — G8929 Other chronic pain: Secondary | ICD-10-CM | POA: Diagnosis present

## 2021-07-31 DIAGNOSIS — K317 Polyp of stomach and duodenum: Secondary | ICD-10-CM | POA: Diagnosis present

## 2021-07-31 DIAGNOSIS — K449 Diaphragmatic hernia without obstruction or gangrene: Secondary | ICD-10-CM | POA: Diagnosis not present

## 2021-07-31 DIAGNOSIS — N189 Chronic kidney disease, unspecified: Secondary | ICD-10-CM | POA: Diagnosis not present

## 2021-07-31 DIAGNOSIS — Z0189 Encounter for other specified special examinations: Secondary | ICD-10-CM

## 2021-07-31 DIAGNOSIS — E8809 Other disorders of plasma-protein metabolism, not elsewhere classified: Secondary | ICD-10-CM | POA: Diagnosis not present

## 2021-07-31 DIAGNOSIS — K3189 Other diseases of stomach and duodenum: Secondary | ICD-10-CM | POA: Diagnosis not present

## 2021-07-31 DIAGNOSIS — Z4682 Encounter for fitting and adjustment of non-vascular catheter: Secondary | ICD-10-CM | POA: Diagnosis not present

## 2021-07-31 DIAGNOSIS — I700 Atherosclerosis of aorta: Secondary | ICD-10-CM | POA: Diagnosis not present

## 2021-07-31 DIAGNOSIS — Z87891 Personal history of nicotine dependence: Secondary | ICD-10-CM

## 2021-07-31 DIAGNOSIS — N3289 Other specified disorders of bladder: Secondary | ICD-10-CM | POA: Diagnosis not present

## 2021-07-31 LAB — I-STAT CHEM 8, ED
BUN: 62 mg/dL — ABNORMAL HIGH (ref 8–23)
Calcium, Ion: 1.1 mmol/L — ABNORMAL LOW (ref 1.15–1.40)
Chloride: 90 mmol/L — ABNORMAL LOW (ref 98–111)
Creatinine, Ser: 7.8 mg/dL — ABNORMAL HIGH (ref 0.61–1.24)
Glucose, Bld: 84 mg/dL (ref 70–99)
HCT: 17 % — ABNORMAL LOW (ref 39.0–52.0)
Hemoglobin: 5.8 g/dL — CL (ref 13.0–17.0)
Potassium: 4 mmol/L (ref 3.5–5.1)
Sodium: 128 mmol/L — ABNORMAL LOW (ref 135–145)
TCO2: 25 mmol/L (ref 22–32)

## 2021-07-31 LAB — COMPREHENSIVE METABOLIC PANEL
ALT: 10 U/L (ref 0–44)
AST: 14 U/L — ABNORMAL LOW (ref 15–41)
Albumin: <1.5 g/dL — ABNORMAL LOW (ref 3.5–5.0)
Alkaline Phosphatase: 237 U/L — ABNORMAL HIGH (ref 38–126)
Anion gap: 15 (ref 5–15)
BUN: 67 mg/dL — ABNORMAL HIGH (ref 8–23)
CO2: 24 mmol/L (ref 22–32)
Calcium: 8.7 mg/dL — ABNORMAL LOW (ref 8.9–10.3)
Chloride: 90 mmol/L — ABNORMAL LOW (ref 98–111)
Creatinine, Ser: 6.16 mg/dL — ABNORMAL HIGH (ref 0.61–1.24)
GFR, Estimated: 10 mL/min — ABNORMAL LOW (ref >60)
Glucose, Bld: 84 mg/dL (ref 70–99)
Potassium: 4.2 mmol/L (ref 3.5–5.1)
Sodium: 129 mmol/L — ABNORMAL LOW (ref 135–145)
Total Bilirubin: 1.4 mg/dL — ABNORMAL HIGH (ref 0.3–1.2)
Total Protein: 6.5 g/dL (ref 6.5–8.1)

## 2021-07-31 LAB — CBC
HCT: 14.9 % — ABNORMAL LOW (ref 39.0–52.0)
Hemoglobin: 5.3 g/dL — CL (ref 13.0–17.0)
MCH: 35.8 pg — ABNORMAL HIGH (ref 26.0–34.0)
MCHC: 35.6 g/dL (ref 30.0–36.0)
MCV: 100.7 fL — ABNORMAL HIGH (ref 80.0–100.0)
Platelets: 227 10*3/uL (ref 150–400)
RBC: 1.48 MIL/uL — ABNORMAL LOW (ref 4.22–5.81)
RDW: 25.6 % — ABNORMAL HIGH (ref 11.5–15.5)
WBC: 8.9 10*3/uL (ref 4.0–10.5)
nRBC: 6.5 % — ABNORMAL HIGH (ref 0.0–0.2)

## 2021-07-31 LAB — MAGNESIUM: Magnesium: 1.5 mg/dL — ABNORMAL LOW (ref 1.7–2.4)

## 2021-07-31 LAB — POC OCCULT BLOOD, ED: Fecal Occult Bld: NEGATIVE

## 2021-07-31 LAB — PREPARE RBC (CROSSMATCH)

## 2021-07-31 MED ORDER — OXYCODONE HCL 5 MG PO CAPS: 10.0000 mg | ORAL_CAPSULE | Freq: Four times a day (QID) | ORAL | 0 refills | Status: DC | PRN

## 2021-07-31 MED ORDER — SENNOSIDES-DOCUSATE SODIUM 8.6-50 MG PO TABS: 1.0000 | ORAL_TABLET | Freq: Every evening | ORAL | Status: DC | PRN

## 2021-07-31 MED ORDER — ACETAMINOPHEN 325 MG PO TABS
650.0000 mg | ORAL_TABLET | Freq: Four times a day (QID) | ORAL | Status: DC | PRN
  Administered 2021-08-11: 650 mg via ORAL
  Filled 2021-07-31: qty 2

## 2021-07-31 MED ORDER — OXYCODONE HCL 5 MG PO TABS
10.0000 mg | ORAL_TABLET | Freq: Four times a day (QID) | ORAL | Status: DC | PRN
  Administered 2021-08-02 – 2021-08-04 (×4): 10 mg via ORAL
  Filled 2021-07-31 (×6): qty 2

## 2021-07-31 MED ORDER — SODIUM CHLORIDE 0.9 % IV SOLN: 250.0000 mL | INTRAVENOUS | Status: DC | PRN

## 2021-07-31 MED ORDER — PENTAFLUOROPROP-TETRAFLUOROETH EX AERO
1.0000 "application " | INHALATION_SPRAY | CUTANEOUS | Status: DC | PRN
  Filled 2021-07-31: qty 116

## 2021-07-31 MED ORDER — HYDROMORPHONE HCL 1 MG/ML IJ SOLN
1.0000 mg | Freq: Once | INTRAMUSCULAR | Status: AC
  Administered 2021-07-31: 1 mg via INTRAVENOUS
  Filled 2021-07-31: qty 1

## 2021-07-31 MED ORDER — SODIUM CHLORIDE 0.9 % IV SOLN: 100.0000 mL | INTRAVENOUS | Status: DC | PRN

## 2021-07-31 MED ORDER — HEPARIN SODIUM (PORCINE) 1000 UNIT/ML DIALYSIS
1000.0000 [IU] | INTRAMUSCULAR | Status: DC | PRN
  Filled 2021-07-31 (×2): qty 1

## 2021-07-31 MED ORDER — LIDOCAINE HCL (PF) 1 % IJ SOLN
5.0000 mL | INTRAMUSCULAR | Status: DC | PRN
  Filled 2021-07-31: qty 5

## 2021-07-31 MED ORDER — ALLOPURINOL 100 MG PO TABS
100.0000 mg | ORAL_TABLET | Freq: Every day | ORAL | Status: DC
  Administered 2021-08-01 – 2021-08-11 (×7): 100 mg via ORAL
  Filled 2021-07-31 (×8): qty 1

## 2021-07-31 MED ORDER — SODIUM CHLORIDE 0.9% FLUSH: 3.0000 mL | INTRAVENOUS | Status: DC | PRN

## 2021-07-31 MED ORDER — FERRIC CITRATE 1 GM 210 MG(FE) PO TABS
420.0000 mg | ORAL_TABLET | Freq: Three times a day (TID) | ORAL | Status: DC
  Administered 2021-08-01 – 2021-08-11 (×19): 420 mg via ORAL
  Filled 2021-07-31 (×20): qty 2

## 2021-07-31 MED ORDER — SODIUM CHLORIDE 0.9% FLUSH
3.0000 mL | Freq: Two times a day (BID) | INTRAVENOUS | Status: DC
  Administered 2021-07-31 – 2021-08-11 (×19): 3 mL via INTRAVENOUS

## 2021-07-31 MED ORDER — SODIUM CHLORIDE 0.9 % IV SOLN: 10.0000 mL/h | Freq: Once | INTRAVENOUS | Status: DC

## 2021-07-31 MED ORDER — SODIUM CHLORIDE 0.9 % IV SOLN
100.0000 mL | INTRAVENOUS | Status: DC | PRN
  Administered 2021-08-02: 100 mL via INTRAVENOUS

## 2021-07-31 MED ORDER — FOLIC ACID 1 MG PO TABS
1.0000 mg | ORAL_TABLET | Freq: Every day | ORAL | Status: DC
  Administered 2021-07-31 – 2021-08-11 (×8): 1 mg via ORAL
  Filled 2021-07-31 (×9): qty 1

## 2021-07-31 MED ORDER — LIDOCAINE-PRILOCAINE 2.5-2.5 % EX CREA
1.0000 "application " | TOPICAL_CREAM | CUTANEOUS | Status: DC | PRN
  Filled 2021-07-31: qty 5

## 2021-07-31 MED ORDER — HYDROMORPHONE HCL 1 MG/ML IJ SOLN
1.0000 mg | INTRAMUSCULAR | Status: AC | PRN
  Administered 2021-08-01 (×3): 1 mg via INTRAVENOUS
  Filled 2021-07-31 (×3): qty 1

## 2021-07-31 MED ORDER — HYDROXYUREA 500 MG PO CAPS
500.0000 mg | ORAL_CAPSULE | Freq: Every day | ORAL | Status: DC
  Filled 2021-07-31 (×3): qty 1

## 2021-07-31 MED ORDER — ONDANSETRON HCL 4 MG PO TABS
4.0000 mg | ORAL_TABLET | Freq: Four times a day (QID) | ORAL | Status: DC | PRN
  Administered 2021-08-02: 4 mg via ORAL
  Filled 2021-07-31: qty 1

## 2021-07-31 MED ORDER — ACETAMINOPHEN 650 MG RE SUPP: 650.0000 mg | Freq: Four times a day (QID) | RECTAL | Status: DC | PRN

## 2021-07-31 MED ORDER — LACTATED RINGERS IV BOLUS
1000.0000 mL | Freq: Once | INTRAVENOUS | Status: AC
  Administered 2021-07-31: 1000 mL via INTRAVENOUS

## 2021-07-31 MED ORDER — MAGNESIUM SULFATE 2 GM/50ML IV SOLN
2.0000 g | Freq: Once | INTRAVENOUS | Status: AC
  Administered 2021-07-31: 2 g via INTRAVENOUS
  Filled 2021-07-31: qty 50

## 2021-07-31 MED ORDER — ONDANSETRON HCL 4 MG/2ML IJ SOLN
4.0000 mg | Freq: Four times a day (QID) | INTRAMUSCULAR | Status: DC | PRN
  Administered 2021-08-01 – 2021-08-04 (×5): 4 mg via INTRAVENOUS
  Filled 2021-07-31 (×4): qty 2

## 2021-07-31 MED ORDER — CHLORHEXIDINE GLUCONATE CLOTH 2 % EX PADS
6.0000 | MEDICATED_PAD | Freq: Every day | CUTANEOUS | Status: DC
  Administered 2021-08-01 – 2021-08-04 (×3): 6 via TOPICAL

## 2021-07-31 MED ORDER — PANTOPRAZOLE SODIUM 40 MG PO TBEC
40.0000 mg | DELAYED_RELEASE_TABLET | Freq: Two times a day (BID) | ORAL | Status: DC
  Administered 2021-07-31 – 2021-08-01 (×2): 40 mg via ORAL
  Filled 2021-07-31 (×2): qty 1

## 2021-07-31 NOTE — ED Notes (Signed)
Blood consent obtained

## 2021-07-31 NOTE — H&P (Signed)
History and Physical    Patient: Patrick [name] BWG:[id number] DOB: [birth year] DOA: 07/31/2021 DOS: the patient was seen and examined on 07/31/2021 PCP: Dorena Dew, FNP  Patient coming from: Hemoccult stool negative in the ER  Chief Complaint:   SOB with exertion, fatigue  HPI: Patrick [name] is a 66 y.o. male with medical history significant of ESRD on HD, Sickle cell disease, CHF, A-fib, recent GI bleed who presents with complaint of shortness of breath with exertion and fatigue.  He reports for the last week he has had increasing shortness of breath with exertion and has had increasing weakness and fatigue.  He last went to hemodialysis on Thursday.  He states that he was to weak and tired on Saturday to go.  He had a recent GI bleed and was taken off Eliquis at that time.  He also had a small bowel obstruction recently and had bowel resection and open cholecystectomy.  He has a healing wound on his abdomen with dressing in place that does not have drainage.  He has not had fever or chills.  He does complain of abdominal pain and low back pain.  He was found to have a hemoglobin of 5.2. Denies tobacco alcohol or illicit drug use.  In the emergency room patient has soft blood pressures but is hemodynamically stable and alert and oriented x3.  He was started on PRBC transfusions.  ER PA ordered 2 units to be transfused.  Have asked that nephrology be consulted as patient may need hemodialysis due to being volume overloaded from missing dialysis Saturday and now being given packed red blood cells.  Hospitalist service asked to admit patient for further management  Review of Systems: As mentioned in the history of present illness. All other systems reviewed and are negative. Past Medical History:  Diagnosis Date   Blood dyscrasia    Blood transfusion    "I've had a bunch of them"   CHF (congestive heart failure) ([place])    followed by hf clinic   Chronic kidney disease (CKD),  stage III (moderate) ([place])    Dialysis patient ([place]) 04/2019   AV fistula (right arm)   Gout    Headache(784.0)    "probably weekly" (01/13/2014)   Legally blind    Shortness of breath dyspnea    Sickle cell disease, type SS (HCC)    Swelling abdomen    Swelling of extremity, left    Swelling of extremity, right    Past Surgical History:  Procedure Laterality Date   A/V FISTULAGRAM Right 07/29/2020   Procedure: A/V FISTULAGRAM - Right Arm;  Surgeon: Angelia Mould, MD;  Location: [place] CV [facility];  Service: Cardiovascular;  Laterality: Right;   BASCILIC VEIN TRANSPOSITION Right 04/12/2014   Procedure: BASILIC VEIN TRANSPOSITION;  Surgeon: Elam Dutch, MD;  Location: [place];  Service: Vascular;  Laterality: Right;   BIOPSY  08/04/2020   Procedure: BIOPSY;  Surgeon: Ladene Artist, MD;  Location: [facility] ENDOSCOPY;  Service: Gastroenterology;;   BIOPSY  06/28/2021   Procedure: BIOPSY;  Surgeon: Jerene Bears, MD;  Location: [facility] ENDOSCOPY;  Service: Gastroenterology;;   BOWEL RESECTION N/A 05/24/2021   Procedure: SMALL BOWEL RESECTION;  Surgeon: Rolm Bookbinder, MD;  Location: [place];  Service: General;  Laterality: N/A;   CARDIAC CATHETERIZATION  05/2011   CARDIOVERSION N/A 06/05/2018   Procedure: CARDIOVERSION;  Surgeon: Jolaine Artist, MD;  Location: [place];  Service: Cardiovascular;  Laterality: N/A;   CARDIOVERSION N/A  10/06/2019   Procedure: CARDIOVERSION;  Surgeon: Jolaine Artist, MD;  Location: [facility] ENDOSCOPY;  Service: Cardiovascular;  Laterality: N/A;   CHOLECYSTECTOMY N/A 05/24/2021   Procedure: OPEN CHOLECYSTECTOMY;  Surgeon: Rolm Bookbinder, MD;  Location: [place];  Service: General;  Laterality: N/A;   COLONOSCOPY WITH PROPOFOL N/A 09/24/2017   Procedure: COLONOSCOPY WITH PROPOFOL;  Surgeon: Jackquline Denmark, MD;  Location: [facility] ENDOSCOPY;  Service: Endoscopy;  Laterality: N/A;   ESOPHAGOGASTRODUODENOSCOPY N/A 09/19/2017   Procedure: ESOPHAGOGASTRODUODENOSCOPY  (EGD);  Surgeon: Jackquline Denmark, MD;  Location: [facility] ENDOSCOPY;  Service: Endoscopy;  Laterality: N/A;   ESOPHAGOGASTRODUODENOSCOPY N/A 05/29/2021   Procedure: ESOPHAGOGASTRODUODENOSCOPY (EGD);  Surgeon: Milus Banister, MD;  Location: [facility] ENDOSCOPY;  Service: Gastroenterology;  Laterality: N/A;   ESOPHAGOGASTRODUODENOSCOPY (EGD) WITH PROPOFOL N/A 08/04/2020   Procedure: ESOPHAGOGASTRODUODENOSCOPY (EGD) WITH PROPOFOL;  Surgeon: Ladene Artist, MD;  Location: [place];  Service: Gastroenterology;  Laterality: N/A;   ESOPHAGOGASTRODUODENOSCOPY (EGD) WITH PROPOFOL N/A 06/28/2021   Procedure: ESOPHAGOGASTRODUODENOSCOPY (EGD) WITH PROPOFOL;  Surgeon: Jerene Bears, MD;  Location: [facility] ENDOSCOPY;  Service: Gastroenterology;  Laterality: N/A;   HEMOSTASIS CLIP PLACEMENT  05/29/2021   Procedure: HEMOSTASIS CLIP PLACEMENT;  Surgeon: Milus Banister, MD;  Location: [facility] ENDOSCOPY;  Service: Gastroenterology;;   HOT HEMOSTASIS N/A 05/29/2021   Procedure: HOT HEMOSTASIS (ARGON PLASMA COAGULATION/BICAP);  Surgeon: Milus Banister, MD;  Location: [facility] ENDOSCOPY;  Service: Gastroenterology;  Laterality: N/A;   HOT HEMOSTASIS N/A 06/28/2021   Procedure: HOT HEMOSTASIS (ARGON PLASMA COAGULATION/BICAP);  Surgeon: Jerene Bears, MD;  Location: [facility] ENDOSCOPY;  Service: Gastroenterology;  Laterality: N/A;   IR PERC TUN PERIT CATH WO PORT S&I /IMAG  05/23/2021   IR REMOVAL TUN CV CATH W/O FL  06/10/2021   IR US GUIDE VASC ACCESS RIGHT  05/23/2021   LAPAROSCOPY N/A 05/24/2021   Procedure: LAPAROSCOPY DIAGNOSTIC;  Surgeon: Rolm Bookbinder, MD;  Location: [place];  Service: General;  Laterality: N/A;   LAPAROTOMY N/A 05/24/2021   Procedure: EXPLORATORY LAPAROTOMY;  Surgeon: Rolm Bookbinder, MD;  Location: [place];  Service: General;  Laterality: N/A;   LEFT AND RIGHT HEART CATHETERIZATION WITH CORONARY ANGIOGRAM N/A 06/11/2011   Procedure: LEFT AND RIGHT HEART CATHETERIZATION WITH CORONARY ANGIOGRAM;  Surgeon: Larey Dresser,  MD;  Location: [facility] CATH LAB;  Service: Cardiovascular;  Laterality: N/A;   PERIPHERAL VASCULAR BALLOON ANGIOPLASTY Right 07/29/2020   Procedure: PERIPHERAL VASCULAR BALLOON ANGIOPLASTY;  Surgeon: Angelia Mould, MD;  Location: [place] CV LAB;  Service: Cardiovascular;  Laterality: Right;  arm fistula   REVISON OF ARTERIOVENOUS FISTULA Right 08/08/2020   Procedure: ANEURYSM EXCISION OF RIGHT UPPER EXTREMITY ARTERIOVENOUS FISTULA;  Surgeon: Angelia Mould, MD;  Location: [facility] OR;  Service: Vascular;  Laterality: Right;   SCLEROTHERAPY  05/29/2021   Procedure: Clide Deutscher;  Surgeon: Milus Banister, MD;  Location: [facility] ENDOSCOPY;  Service: Gastroenterology;;   Lavell Islam REMOVAL  05/29/2021   Procedure: STENT REMOVAL;  Surgeon: Milus Banister, MD;  Location: [facility] ENDOSCOPY;  Service: Gastroenterology;;   TEE WITHOUT CARDIOVERSION N/A 12/27/2015   Procedure: TRANSESOPHAGEAL ECHOCARDIOGRAM (TEE);  Surgeon: Sueanne Margarita, MD;  Location: [facility] ENDOSCOPY;  Service: Cardiovascular;  Laterality: N/A;   Social History:  reports that he quit smoking about 5 years ago. His smoking use included cigarettes. He has a 20.50 pack-year smoking history. He has never used smokeless tobacco. He reports that he does not currently use alcohol after a past usage of about 3.0 standard drinks per week. He reports that  he does not use drugs.  No Known Allergies  Family History  Problem Relation Age of Onset   Alcohol abuse Mother    Liver disease Mother    Cirrhosis Mother    Heart attack Mother     Prior to Admission medications   Medication Sig Start Date End Date Taking? Authorizing Provider  Acetaminophen (TYLENOL 8 HOUR PO) Patient takes two 500 mg tablets as needed for pain.   Yes [provider]  albuterol (VENTOLIN HFA) 108 (90 Base) MCG/ACT inhaler Inhale 2 puffs into the lungs every 6 (six) hours as needed for wheezing or shortness of breath. 05/09/20  Yes Freddi Starr, MD   allopurinol (ZYLOPRIM) 100 MG tablet TAKE 1 TABLET BY MOUTH EVERY DAY Patient taking differently: Take 100 mg by mouth daily. 07/13/21  Yes Dorena Dew, FNP  AURYXIA 1 GM 210 MG(Fe) tablet Take 420 mg by mouth 3 (three) times daily. 02/11/21  Yes [provider]  folic acid (FOLVITE) 1 MG tablet TAKE 1 TABLET BY MOUTH EVERY DAY Patient taking differently: Take 1 mg by mouth daily. 02/22/21  Yes Dorena Dew, FNP  Glycerin-Hypromellose-PEG 400 (VISINE DRY EYE OP) Place 1 drop into both eyes as needed (dryness).   Yes [provider]  hydroxyurea (HYDREA) 500 MG capsule Take 1 capsule (500 mg total) by mouth daily. TAKE 1 CAPSULE BY MOUTH EVERY DAY WITH FOOD TO MINIMIZE GI SIDE EFFECTS Patient taking differently: Take 500 mg by mouth daily before breakfast. 01/31/21  Yes Dorena Dew, FNP  lidocaine-prilocaine (EMLA) cream Apply 1 application topically daily as needed (port access).  09/08/19  Yes [provider]  oxycodone (OXY-IR) 5 MG capsule Take 2 capsules (10 mg total) by mouth every 6 (six) hours as needed for pain. 07/31/21  Yes Dorena Dew, FNP  pantoprazole (PROTONIX) 40 MG tablet TAKE 1 TABLET BY MOUTH TWICE A DAY Patient taking differently: Take 40 mg by mouth 2 (two) times daily. 07/26/21  Yes Willia Craze, NP  tiZANidine (ZANAFLEX) 4 MG tablet Take 0.5 tablets (2 mg total) by mouth every 8 (eight) hours as needed for muscle spasms. 07/18/21  Yes Dorena Dew, FNP  torsemide (DEMADEX) 20 MG tablet Take 20 mg by mouth in the morning, at noon, in the evening, and at bedtime.   Yes [provider]  Vitamin D, Ergocalciferol, (DRISDOL) 1.25 MG (50000 UNIT) CAPS capsule TAKE 1 CAPSULE BY MOUTH ONE TIME PER WEEK Patient taking differently: Take 50,000 Units by mouth once a week. Monday 05/15/21  Yes Dorena Dew, FNP  carvedilol (COREG) 3.125 MG tablet Take 1 tablet (3.125 mg total) by mouth 2 (two) times daily. Patient not  taking: Reported on 07/31/2021 07/17/21 10/15/21  Rafael Bihari, FNP  gabapentin (NEURONTIN) 100 MG capsule TAKE 1 CAPSULE BY MOUTH EVERYDAY AT BEDTIME Patient not taking: Reported on 07/31/2021 12/12/20   Dorena Dew, FNP    Physical Exam: Vitals:   07/31/21 1900 07/31/21 1915 07/31/21 1930 07/31/21 1950  BP: (!) 85/58 92/65 94/69    Pulse: 91 70 96   Resp: 20 12 18    Temp:      TempSrc:      SpO2: 98% 98% 100% 100%   General: WDWN, Alert and oriented x3.  Eyes: EOMI, PERRL, conjunctivae pale.  Sclera nonicteric HENT:  [place]/AT, external ears normal.  Nares patent without epistasis.  Mucous membranes are moist. Posterior pharynx clear of any exudate  Neck: Soft, normal  range of motion, supple, no masses,  Trachea midline Respiratory: clear to auscultation bilaterally, no wheezing, no crackles. Normal respiratory effort. No accessory muscle use.  Cardiovascular: Irregularly irregular rhythm, no murmurs / rubs / gallops.  Abdomen: Soft, no tenderness, nondistended, no rebound or guarding. No masses palpated. Bowel sounds normoactive. Open healing wound in midline central abdomen with no drainge or erythema Musculoskeletal: FROM. no cyanosis. No joint deformity upper and lower extremities. Normal muscle tone.  Skin: Warm, dry, intact no rashes, lesions, ulcers. No induration Neurologic: CN 2-12 grossly intact.  Normal speech.  Sensation intact Psychiatric: Normal judgment and insight.  Normal mood.    Data Reviewed: Lab work shows WBC 8900 hemoglobin 5.3 hematocrit 14.9 platelets 227,000 sodium 129 potassium 4.2 chloride 90 bicarb 24 creatinine 7.80 BUN 62 magnesium 1.5 alkaline phosphatase 237 albumin less than 1.5 AST 14 ALT 10 total bilirubin 1.4 Hemoccult stool negative in the ER  EKG shows atrial fibrillation with nonspecific ST changes but no acute ST elevation or depression.  QTc 412  Assessment and Plan: * Symptomatic anemia- (present on admission) Mr. Asher is admitted  to medical telemetry.  Started on PRBC transfusion in ER.  Monitor fluid status.  Monitor Hgb/Hct serially  End stage renal disease ([place])- (present on admission) Nephrology consulted for HD as pt missed last session and is receiving PRBC transfusion  Chronic systolic CHF (congestive heart failure) ([place])- (present on admission) Continue current dose of coreg. Pt had echo in Dec. 2022 that showed EF 40-45% with LVH and LV dysfunction, RV dysfunction and enlarged chambers Monitor Daily weights  Permanent atrial fibrillation ([place])- (present on admission) Rate is controlled. Continue coreg Pt is not anticoagulated due to recent GI bleed.   Essential hypertension- (present on admission) Pt is no longer taking Coreg and Bp is soft. Monitor BP  Hyponatremia Chronic. Monitor electrolytes.  Nephrology following  Hypomagnesemia Given magnesium to replete.  Sickle cell anemia ([place])- (present on admission) Chronic. No pain crisis    Advance Care Planning:   CODE STATUS: Full code  Consults: Nephrology consulted by ER provider  Family Communication: Diagnosis and plan discussed with patient.  Verbalized understanding agrees with plan.  Further recommendation follow as clinically indicated  Author: Eben Burow, MD 07/31/2021 8:00 PM  For on call review [email or web address].

## 2021-07-31 NOTE — Assessment & Plan Note (Addendum)
Appears Euvolemic.  Pt had echo in Dec. 2022 that showed EF 40-45% with LVH and LV dysfunction, RV dysfunction and enlarged chambers Monitor Daily weights

## 2021-07-31 NOTE — Assessment & Plan Note (Addendum)
Mr. Bastidas is admitted to medical telemetry.  S/p prbc transfusion and repeat Hemoglobin greater than 7.  GI consulted in view of recent history of GI bleed.  Last EGD last month showed hemorrhagic gastritis.  Plan for EGD in am by Dr Bryan Lemma.  Continue with PPI BID.  Monitor fluid status.  Monitor Hgb/Hct serially

## 2021-07-31 NOTE — ED Notes (Signed)
Consent at bedside.  

## 2021-07-31 NOTE — Assessment & Plan Note (Addendum)
Rate is controlled, coreg is held due to soft BP parameters.  Pt is not anticoagulated due to recent GI bleed.

## 2021-07-31 NOTE — ED Provider Triage Note (Signed)
Emergency Medicine Provider Triage Evaluation Note  Patrick [name] , a 66 y.o. male  was evaluated in triage.  Pt complains of fatigue and weakness of several day duration.  Patient states he missed his dialysis session on Saturday because of weakness.  Patient's last dialysis session was Thursday.  States he had some labs done at that time and received a call today letting him know that his hemoglobin was 5 and he needed to come to the emergency room for transfusion.  Denies lightheadedness, or syncopal episodes.  States his stools are brown.  States he had an episode of emesis yesterday.  Denies hematemesis.  Denies fever.  Review of Systems  Positive: As above Negative: As above  Physical Exam  BP (!) 86/56    Pulse (!) 115    Temp 97.8 F (36.6 C) (Oral)    Resp 13    SpO2 100%  Gen:   Awake, no distress   Resp:  Normal effort  MSK:   Moves extremities without difficulty  Other:    Medical Decision Making  Medically screening exam initiated at 4:12 PM.  Appropriate orders placed.  Jag D Khatoon was informed that the remainder of the evaluation will be completed by another provider, this initial triage assessment does not replace that evaluation, and the importance of remaining in the ED until their evaluation is complete.     Evlyn Courier, PA-C 07/31/21 1614

## 2021-07-31 NOTE — ED Triage Notes (Signed)
Patient states that he was sent from dialysis center for blood transfusion. Was told very low and go to ED. Patient missed his last dialysis Saturday due to weakness. Patient complains of back pain with standing. Alert and oriented

## 2021-07-31 NOTE — Assessment & Plan Note (Addendum)
Pt is no longer on coreg. Currently on no antihypertensives. BP has been soft in 80-90/55-65 range.  Monitor BP. Pt asymptomatic.

## 2021-07-31 NOTE — Assessment & Plan Note (Addendum)
Chronic, from ESRD.  Nephrology following

## 2021-07-31 NOTE — ED Notes (Signed)
RN notified by lab of critical hemoglobin at 5.2, Tyrone Nine MD aware

## 2021-07-31 NOTE — Assessment & Plan Note (Signed)
Chronic. No pain crisis

## 2021-07-31 NOTE — Assessment & Plan Note (Signed)
Given magnesium to replete.

## 2021-07-31 NOTE — Assessment & Plan Note (Addendum)
Nephrology consulted for HD as pt missed last session and is receiving PRBC transfusion. Plan for HD today.

## 2021-07-31 NOTE — Telephone Encounter (Signed)
Pt called in and stated he needed back pain med's to be refilled

## 2021-07-31 NOTE — Progress Notes (Signed)
Reviewed PDMP substance reporting system prior to prescribing opiate medications. No inconsistencies noted.  Meds ordered this encounter  Medications   oxycodone (OXY-IR) 5 MG capsule    Sig: Take 2 capsules (10 mg total) by mouth every 6 (six) hours as needed for pain.    Dispense:  60 capsule    Refill:  0    Order Specific Question:   Supervising Provider    Answer:   [name] [[id number]]     [name]  APRN, MSN, FNP-C Patient [place] [address] [place], [place] [address] [phone]

## 2021-07-31 NOTE — ED Notes (Signed)
Pt's wound dressing on his abdomen changed

## 2021-07-31 NOTE — Telephone Encounter (Signed)
[facility] BRIEF NOTE  Patrick [name] 01-03-1956 [id number]  NOTE PLACE FOR CONTINUITY OF CARE  Patient called by provider at outpatient unit today and advised to go the the ED for blood transfusion due to Hgb 5.8  on labs collected 07/27/21.  Patient agreed and going to ED.   Jen Mow, PA-C [employer]

## 2021-07-31 NOTE — ED Provider Notes (Signed)
[facility] EMERGENCY DEPARTMENT Provider Note   CSN: [id number] Arrival date & time: 07/31/21  1557     History No chief complaint on file.   Patrick [name] is a 66 y.o. male with history of A-fib, sickle cell disease, CHF, end-stage renal disease on T/Th/Sat dialysis presents emerged department for evaluation of low hemoglobin.  The patient was called today by his dialysis and nursing that his hemoglobin was too low and that he was needed return the emergency room.  The patient reports it was "5 something".  Patient was recently discharged in the hospital on 06-27-2021 for upper GI bleed.  He denies any abdominal pain, nausea, vomiting, or chest pain.  He reports some shortness of breath as well this week this and fatigue for the past few days.  He skipped dialysis on Saturday because of this.  He denies any swelling.  The patient reports he had a recent cholecystectomy although he reports it was before Christmas.  He has a bandage on his abdomen.  I asked if he has followed up with general surgery with this and he reports he has an "appointment coming soon".  He is reporting some chronic back pain. Additionally, he reports he has not been on his blood thinner since his surgery, but doesn't know why they took him off of it.   HPI     Home Medications Prior to Admission medications   Medication Sig Start Date End Date Taking? Authorizing Provider  Acetaminophen (TYLENOL 8 HOUR PO) Patient takes two 500 mg tablets as needed for pain.    [provider]  albuterol (VENTOLIN HFA) 108 (90 Base) MCG/ACT inhaler Inhale 2 puffs into the lungs every 6 (six) hours as needed for wheezing or shortness of breath. 05/09/20   Freddi Starr, MD  allopurinol (ZYLOPRIM) 100 MG tablet TAKE 1 TABLET BY MOUTH EVERY DAY 07/13/21   Dorena Dew, FNP  AURYXIA 1 GM 210 MG(Fe) tablet Take 420 mg by mouth 3 (three) times daily. 02/11/21   [provider]  carvedilol  (COREG) 3.125 MG tablet Take 1 tablet (3.125 mg total) by mouth 2 (two) times daily. 07/17/21 10/15/21  Rafael Bihari, FNP  folic acid (FOLVITE) 1 MG tablet TAKE 1 TABLET BY MOUTH EVERY DAY 02/22/21   Dorena Dew, FNP  gabapentin (NEURONTIN) 100 MG capsule TAKE 1 CAPSULE BY MOUTH EVERYDAY AT BEDTIME 12/12/20   Dorena Dew, FNP  hydroxyurea (HYDREA) 500 MG capsule Take 1 capsule (500 mg total) by mouth daily. TAKE 1 CAPSULE BY MOUTH EVERY DAY WITH FOOD TO MINIMIZE GI SIDE EFFECTS 01/31/21   Dorena Dew, FNP  lidocaine-prilocaine (EMLA) cream Apply 1 application topically daily as needed (port access).  09/08/19   [provider]  oxycodone (OXY-IR) 5 MG capsule Take 2 capsules (10 mg total) by mouth every 6 (six) hours as needed for pain. 07/31/21   Dorena Dew, FNP  pantoprazole (PROTONIX) 40 MG tablet TAKE 1 TABLET BY MOUTH TWICE A DAY 07/26/21   Willia Craze, NP  tiZANidine (ZANAFLEX) 4 MG tablet Take 0.5 tablets (2 mg total) by mouth every 8 (eight) hours as needed for muscle spasms. 07/18/21   Dorena Dew, FNP  torsemide (DEMADEX) 20 MG tablet Take 80 mg by mouth daily.    [provider]  Vitamin D, Ergocalciferol, (DRISDOL) 1.25 MG (50000 UNIT) CAPS capsule TAKE 1 CAPSULE BY MOUTH ONE TIME PER WEEK Patient taking differently: Take 50,000  Units by mouth once a week. Monday 05/15/21   Dorena Dew, FNP      Allergies    Patient has no known allergies.    Review of Systems   Review of Systems  Constitutional:  Positive for fatigue. Negative for chills and fever.  HENT:  Negative for congestion and sore throat.   Respiratory:  Positive for shortness of breath.   Cardiovascular:  Negative for chest pain and leg swelling.  Gastrointestinal:  Negative for abdominal pain, nausea and vomiting.  Musculoskeletal:  Positive for back pain.  Neurological:  Positive for weakness.   Physical Exam Updated Vital Signs BP (!) 84/53    Pulse 83     Temp (!) 96 F (35.6 C) (Rectal)    Resp 20    SpO2 100%  Physical Exam Vitals and nursing note reviewed.  Constitutional:      General: He is not in acute distress.    Appearance: Normal appearance. He is not toxic-appearing.  HENT:     Head: Normocephalic and atraumatic.  Eyes:     General: No scleral icterus.    Extraocular Movements: Extraocular movements intact.     Pupils: Pupils are equal, round, and reactive to light.  Cardiovascular:     Rate and Rhythm: Normal rate. Rhythm irregular.  Pulmonary:     Effort: Pulmonary effort is normal. No respiratory distress.     Breath sounds: Normal breath sounds.  Abdominal:     General: Abdomen is flat. Bowel sounds are normal.     Palpations: Abdomen is soft.     Tenderness: There is abdominal tenderness. There is no guarding or rebound.     Comments: The patient has a surgical incision inferior to his umbilicus.  He still has a small area of pink granulation tissue with some mild surrounding erythema and partially wet wound bed that he is kept covered with a bandage since his surgery.  No dehiscence noted.  Musculoskeletal:        General: No swelling, tenderness or deformity.     Cervical back: Normal range of motion.  Skin:    General: Skin is warm and dry.  Neurological:     General: No focal deficit present.     Mental Status: He is alert. Mental status is at baseline.     Cranial Nerves: No cranial nerve deficit.     Sensory: No sensory deficit.     Comments: Generalized weakness to upper and lower extremities but no focal deficit.  Sensation intact throughout.  Cranial nerves II through XII intact.    ED Results / Procedures / Treatments   Labs (all labs ordered are listed, but only abnormal results are displayed) Labs Reviewed  COMPREHENSIVE METABOLIC PANEL - Abnormal; Notable for the following components:      Result Value   Sodium 129 (*)    Chloride 90 (*)    BUN 67 (*)    Creatinine, Ser 6.16 (*)    Calcium 8.7  (*)    Albumin <1.5 (*)    AST 14 (*)    Alkaline Phosphatase 237 (*)    Total Bilirubin 1.4 (*)    GFR, Estimated 10 (*)    All other components within normal limits  CBC - Abnormal; Notable for the following components:   RBC 1.48 (*)    Hemoglobin 5.3 (*)    HCT 14.9 (*)    MCV 100.7 (*)    MCH 35.8 (*)    RDW 25.6 (*)  nRBC 6.5 (*)    All other components within normal limits  MAGNESIUM - Abnormal; Notable for the following components:   Magnesium 1.5 (*)    All other components within normal limits  I-STAT CHEM 8, ED - Abnormal; Notable for the following components:   Sodium 128 (*)    Chloride 90 (*)    BUN 62 (*)    Creatinine, Ser 7.80 (*)    Calcium, Ion 1.10 (*)    Hemoglobin 5.8 (*)    HCT 17.0 (*)    All other components within normal limits  POC OCCULT BLOOD, ED  TYPE AND SCREEN  PREPARE RBC (CROSSMATCH)    EKG EKG Interpretation  Date/Time:  Monday July 31 2021 16:08:36 EST Ventricular Rate:  100 PR Interval:    QRS Duration: 84 QT Interval:  320 QTC Calculation: 412 R Axis:   67 Text Interpretation: Atrial fibrillation Nonspecific T wave abnormality Abnormal ECG When compared with ECG of 17-Jul-2021 14:23, PREVIOUS ECG IS PRESENT Confirmed by Thamas Jaegers ([id number]) on 07/31/2021 4:14:16 PM  Radiology No results found.  Procedures .Critical Care Performed by: Sherrell Puller, PA-C Authorized by: Sherrell Puller, PA-C   Critical care provider statement:    Critical care time (minutes):  30   Critical care was necessary to treat or prevent imminent or life-threatening deterioration of the following conditions: Blood transfusion.   Critical care was time spent personally by me on the following activities:  Development of treatment plan with patient or surrogate, discussions with consultants, evaluation of patient's response to treatment, examination of patient, ordering and review of laboratory studies, ordering and review of radiographic studies,  ordering and performing treatments and interventions, pulse oximetry, re-evaluation of patient's condition, review of old charts and obtaining history from patient or surrogate   Care discussed with: admitting provider     Medications Ordered in ED Medications  0.9 %  sodium chloride infusion ( Intravenous MAR [facility] 08/02/21 1358)  sodium chloride flush (NS) 0.9 % injection 3 mL ( Intravenous [facility] 08/02/21 1358)  sodium chloride flush (NS) 0.9 % injection 3 mL ( Intravenous MAR [facility] 08/02/21 1358)  0.9 %  sodium chloride infusion ( Intravenous MAR [facility] 08/02/21 1358)  acetaminophen (TYLENOL) tablet 650 mg ( Oral MAR [facility] 08/02/21 1358)    Or  acetaminophen (TYLENOL) suppository 650 mg ( Rectal MAR [facility] 08/02/21 1358)  ondansetron (ZOFRAN) tablet 4 mg ( Oral MAR [facility] 08/02/21 1358)    Or  ondansetron (ZOFRAN) injection 4 mg ( Intravenous [facility] 08/02/21 1358)  senna-docusate (Senokot-S) tablet 1 tablet ( Oral [facility] 08/02/21 1358)  Chlorhexidine Gluconate Cloth 2 % PADS 6 each ( Topical MAR [facility] 08/02/21 1358)  pentafluoroprop-tetrafluoroeth (GEBAUERS) aerosol 1 application ( Topical MAR [facility] 08/02/21 1358)  lidocaine (PF) (XYLOCAINE) 1 % injection 5 mL ( Intradermal [facility] 08/02/21 1358)  lidocaine-prilocaine (EMLA) cream 1 application ( Topical MAR [facility] 08/02/21 1358)  0.9 %  sodium chloride infusion ( Intravenous MAR [facility] 08/02/21 1358)  0.9 %  sodium chloride infusion ( Intravenous MAR [facility] 08/02/21 1358)  heparin injection 1,000 Units ( Dialysis MAR [facility] 08/02/21 1358)  allopurinol (ZYLOPRIM) tablet 100 mg ( Oral MAR [facility] 08/02/21 1358)  oxyCODONE (Oxy IR/ROXICODONE) immediate release tablet 10 mg ( Oral MAR [facility] 08/02/21 1358)  hydroxyurea (HYDREA) capsule 500 mg ( Oral MAR [facility] 07/09/46 2500)  folic acid (FOLVITE) tablet 1 mg ( Oral MAR [facility] 08/02/21 1358)  ferric citrate (AURYXIA) tablet 420 mg (420 mg  Oral Given 08/02/21 1742)  (feeding supplement)  PROSource Plus liquid 30 mL (30 mLs Oral Not Given 08/02/21 1400)  pantoprazole (PROTONIX) injection 40 mg ( Intravenous MAR [facility] 08/02/21 1358)  promethazine (PHENERGAN) 12.5 mg in sodium chloride 0.9 % 50 mL IVPB ( Intravenous MAR [facility] 08/02/21 1358)  Chlorhexidine Gluconate Cloth 2 % PADS 6 each ( Topical MAR [facility] 08/02/21 1358)  HYDROmorphone (DILAUDID) injection 0.5 mg (0.5 mg Intravenous Given 08/02/21 1408)  lactated ringers bolus 1,000 mL (0 mLs Intravenous Stopped 07/31/21 1812)  HYDROmorphone (DILAUDID) injection 1 mg (1 mg Intravenous Given 07/31/21 2045)  HYDROmorphone (DILAUDID) injection 1 mg (1 mg Intravenous Given 08/01/21 2104)  magnesium sulfate IVPB 2 g 50 mL (0 g Intravenous Stopped 07/31/21 2315)  albumin human 25 % solution 25 g (0 g Intravenous Stopping Infusion hung by another clincian 08/01/21 1928)  0.9 %  sodium chloride infusion (Manually program via Guardrails IV Fluids) (0 mLs Intravenous Stopped 08/01/21 2100)    ED Course/ Medical Decision Making/ A&P                           Medical Decision Making Amount and/or Complexity of Data Reviewed Labs: ordered.  Risk Prescription drug management. Decision regarding hospitalization.   66 year old male with history of A-fib, sickle cell disease, CHF, end-stage renal disease on T/Th/Sat dialysis presents emerged department for evaluation of low hemoglobin.  Differential diagnosis includes was not limited to anemia, iron deficiency anemia, GI bleed, sickle cell.  Vital signs soft blood pressures although from chart investigation, the patient tends to run on the lower side and was discharged with a blood pressure systolically in the 01U previously.  Afebrile.  Satting well on room air.  Physical exam pertinent for a surgical incision inferior to his umbilicus.  He still has a small area of pink granulation tissue with some mild surrounding erythema and partially wet wound bed that he is kept covered with a bandage since his  surgery.  No dehiscence noted.  He has generalized weakness in his upper and lower extremities although nothing focal.  He has appropriate speech and is answering questions appropriately. Irregularly irregular rhythm.   On outside record review, the patient has history of GI bleeds.  Will obtain Hemoccult.  He was taken off of his blood thinner due to these GI bleeds.  Labs ordered.  I independently reviewed and interpreted the patient's labs.  CMP shows hyponatremia at 129 although this appears to be at patient's baseline.  He has mild decreased chloride at 90.  Elevated BUN and creatinine at 6.16 although patient is a known dialysis patient.  Mild hypocalcemia at 8.7.  Significant hypoalbuminemia although this is present in patient's previous CMP's.  Decreased AST at 14.  Increased alk phos at 237 significantly improved from patient's CMP 1 month ago.  Elevated total bili at 1.4 likely due to patient sickle cell.  Type and screen ordered.  POC occult blood was negative.  CBC shows significant anemia at 5.3 although it appears the patient's baseline is somewhere around 7.  Given the patient's history of GI bleeds although negative occult blood, will order 2 units.  Overall, the patient received 2 units of blood and liter of LR.  He was initially given a liter of LR due to soft pressures but through chart investigation it was given this is his typical baseline.  We will admit patient for symptomatic anemia requiring transfusion and dialysis.  I was  admitting the patient to medicine when they were concerned about fluid overload with 2 units of blood and a dialysis patient.  I am unsure as to why they are concerned about fluid overload with 2 units of blood, but I consulted nephrology and spoke to Dr. Hollie Salk who reassured me that she would not dialyze the patient with a hemoglobin of 5.3 and to continue with the 2 units of blood.    Dr. Leory Plowman Chotiner to admit.   I discussed this case with my  attending physician who cosigned this note including patient's presenting symptoms, physical exam, and planned diagnostics and interventions. Attending physician stated agreement with plan or made changes to plan which were implemented.    Final Clinical Impression(s) / ED Diagnoses Final diagnoses:  Symptomatic anemia  ESRD on dialysis [facility])  Atrial fibrillation, unspecified type [facility])    Rx / DC Orders ED Discharge Orders     None         Sherrell Puller, PA-C 08/02/21 2115    Deno Etienne, DO 08/02/21 2130

## 2021-08-01 ENCOUNTER — Ambulatory Visit: Payer: HMO | Admitting: Family Medicine

## 2021-08-01 DIAGNOSIS — N186 End stage renal disease: Secondary | ICD-10-CM | POA: Diagnosis not present

## 2021-08-01 DIAGNOSIS — L8990 Pressure ulcer of unspecified site, unspecified stage: Secondary | ICD-10-CM | POA: Insufficient documentation

## 2021-08-01 DIAGNOSIS — I5022 Chronic systolic (congestive) heart failure: Secondary | ICD-10-CM

## 2021-08-01 DIAGNOSIS — D649 Anemia, unspecified: Secondary | ICD-10-CM | POA: Diagnosis not present

## 2021-08-01 DIAGNOSIS — I4821 Permanent atrial fibrillation: Secondary | ICD-10-CM

## 2021-08-01 DIAGNOSIS — I10 Essential (primary) hypertension: Secondary | ICD-10-CM | POA: Diagnosis not present

## 2021-08-01 LAB — HEPATITIS B SURFACE ANTIGEN: Hepatitis B Surface Ag: NONREACTIVE

## 2021-08-01 LAB — BASIC METABOLIC PANEL
Anion gap: 16 — ABNORMAL HIGH (ref 5–15)
BUN: 72 mg/dL — ABNORMAL HIGH (ref 8–23)
CO2: 25 mmol/L (ref 22–32)
Calcium: 8.7 mg/dL — ABNORMAL LOW (ref 8.9–10.3)
Chloride: 89 mmol/L — ABNORMAL LOW (ref 98–111)
Creatinine, Ser: 6.37 mg/dL — ABNORMAL HIGH (ref 0.61–1.24)
GFR, Estimated: 9 mL/min — ABNORMAL LOW (ref >60)
Glucose, Bld: 75 mg/dL (ref 70–99)
Potassium: 4.3 mmol/L (ref 3.5–5.1)
Sodium: 130 mmol/L — ABNORMAL LOW (ref 135–145)

## 2021-08-01 LAB — CBC
HCT: 19.2 % — ABNORMAL LOW (ref 39.0–52.0)
HCT: 20.7 % — ABNORMAL LOW (ref 39.0–52.0)
Hemoglobin: 7 g/dL — ABNORMAL LOW (ref 13.0–17.0)
Hemoglobin: 7.6 g/dL — ABNORMAL LOW (ref 13.0–17.0)
MCH: 33.8 pg (ref 26.0–34.0)
MCH: 33.6 pg (ref 26.0–34.0)
MCHC: 36.5 g/dL — ABNORMAL HIGH (ref 30.0–36.0)
MCHC: 36.7 g/dL — ABNORMAL HIGH (ref 30.0–36.0)
MCV: 92.8 fL (ref 80.0–100.0)
MCV: 91.6 fL (ref 80.0–100.0)
Platelets: 218 10*3/uL (ref 150–400)
Platelets: 223 10*3/uL (ref 150–400)
RBC: 2.07 MIL/uL — ABNORMAL LOW (ref 4.22–5.81)
RBC: 2.26 MIL/uL — ABNORMAL LOW (ref 4.22–5.81)
RDW: 21.7 % — ABNORMAL HIGH (ref 11.5–15.5)
RDW: 22.3 % — ABNORMAL HIGH (ref 11.5–15.5)
WBC: 10.5 10*3/uL (ref 4.0–10.5)
WBC: 11.6 10*3/uL — ABNORMAL HIGH (ref 4.0–10.5)
nRBC: 5.6 % — ABNORMAL HIGH (ref 0.0–0.2)
nRBC: 5.5 % — ABNORMAL HIGH (ref 0.0–0.2)

## 2021-08-01 LAB — RENAL FUNCTION PANEL
Albumin: <1.5 g/dL — ABNORMAL LOW (ref 3.5–5.0)
Anion gap: 15 (ref 5–15)
BUN: 75 mg/dL — ABNORMAL HIGH (ref 8–23)
CO2: 23 mmol/L (ref 22–32)
Calcium: 8.7 mg/dL — ABNORMAL LOW (ref 8.9–10.3)
Chloride: 90 mmol/L — ABNORMAL LOW (ref 98–111)
Creatinine, Ser: 6.26 mg/dL — ABNORMAL HIGH (ref 0.61–1.24)
GFR, Estimated: 9 mL/min — ABNORMAL LOW (ref >60)
Glucose, Bld: 72 mg/dL (ref 70–99)
Phosphorus: 4.9 mg/dL — ABNORMAL HIGH (ref 2.5–4.6)
Potassium: 4.9 mmol/L (ref 3.5–5.1)
Sodium: 128 mmol/L — ABNORMAL LOW (ref 135–145)

## 2021-08-01 LAB — RESP PANEL BY RT-PCR (FLU A&B, COVID) ARPGX2
Influenza A by PCR: NEGATIVE
Influenza B by PCR: NEGATIVE
SARS Coronavirus 2 by RT PCR: NEGATIVE

## 2021-08-01 LAB — HEMOGLOBIN AND HEMATOCRIT, BLOOD
HCT: 18.5 % — ABNORMAL LOW (ref 39.0–52.0)
Hemoglobin: 6.6 g/dL — CL (ref 13.0–17.0)

## 2021-08-01 LAB — HEPATITIS B SURFACE ANTIBODY,QUALITATIVE: Hep B S Ab: REACTIVE — AB

## 2021-08-01 LAB — MRSA NEXT GEN BY PCR, NASAL: MRSA by PCR Next Gen: NOT DETECTED

## 2021-08-01 LAB — PREPARE RBC (CROSSMATCH)

## 2021-08-01 MED ORDER — ALTEPLASE 2 MG IJ SOLR: 2.0000 mg | Freq: Once | INTRAMUSCULAR | Status: DC | PRN

## 2021-08-01 MED ORDER — PANTOPRAZOLE SODIUM 40 MG IV SOLR
40.0000 mg | Freq: Two times a day (BID) | INTRAVENOUS | Status: DC
  Administered 2021-08-01 – 2021-08-11 (×19): 40 mg via INTRAVENOUS
  Filled 2021-08-01 (×20): qty 10

## 2021-08-01 MED ORDER — ALBUMIN HUMAN 25 % IV SOLN
25.0000 g | Freq: Once | INTRAVENOUS | Status: AC
  Administered 2021-08-01: 25 g via INTRAVENOUS
  Filled 2021-08-01: qty 100

## 2021-08-01 MED ORDER — PROSOURCE PLUS PO LIQD
30.0000 mL | Freq: Two times a day (BID) | ORAL | Status: DC
  Administered 2021-08-03 – 2021-08-11 (×6): 30 mL via ORAL
  Filled 2021-08-01 (×10): qty 30

## 2021-08-01 MED ORDER — SODIUM CHLORIDE 0.9% IV SOLUTION: Freq: Once | INTRAVENOUS | Status: AC

## 2021-08-01 NOTE — Consult Note (Addendum)
[facility] Renal Consultation Note    Indication for Consultation:  Management of ESRD/hemodialysis; anemia, hypertension/volume and secondary hyperparathyroidism  MCN:[id number], Asencion Partridge, FNP  HPI: Patrick [name] is a 66 y.o. male with ESRD on HD TTS at [facility].  Past medical history significant for sickle cell disease, CHF, A-fib and recent admission for recurrent GI bleed and SBO s/p bowel resection and open cholecystectomy.    Patient seen and examined in dialysis. Patient received call from HD yesterday to go to the ED due to low Hgb 5.8.  Denies bleeding, melena, hemoptysis, hematemesis, and hematochezia.  Admits to weakness, fatigue and SOB worsened over the last week.  Reports abdominal pain and dark emesis x 2 days and unable to hold down most food and liquid.  Vomiting improved today but remains nauseated. Admits to missing dialysis on Saturday due to weakness. Shortness of breath improved this morning.  Denies CP, fever, chills and diarrhea.    Review of outpatient record shows patient dry weight has been serially lowered over the last few weeks.  Blood pressure have been variable.  He typically shortens treatments to about 3.5 hours.   Pertinent findings during work up include Hgb 5.3 improved to 7.6 s/p 2 unit pRBC and negative FOBT.  Patient has been admitted for further evaluation and management.    Past Medical History:  Diagnosis Date   Blood dyscrasia    Blood transfusion    "I've had a bunch of them"   CHF (congestive heart failure) ([place])    followed by hf clinic   Chronic kidney disease (CKD), stage III (moderate) ([place])    Dialysis patient ([place]) 04/2019   AV fistula (right arm)   Gout    Headache(784.0)    "probably weekly" (01/13/2014)   Legally blind    Shortness of breath dyspnea    Sickle cell disease, type SS (HCC)    Swelling abdomen    Swelling of extremity, left    Swelling of extremity, right    Past Surgical History:  Procedure  Laterality Date   A/V FISTULAGRAM Right 07/29/2020   Procedure: A/V FISTULAGRAM - Right Arm;  Surgeon: Angelia Mould, MD;  Location: [place] [facility];  Service: Cardiovascular;  Laterality: Right;   BASCILIC VEIN TRANSPOSITION Right 04/12/2014   Procedure: BASILIC VEIN TRANSPOSITION;  Surgeon: Elam Dutch, MD;  Location: [place];  Service: Vascular;  Laterality: Right;   BIOPSY  08/04/2020   Procedure: BIOPSY;  Surgeon: Ladene Artist, MD;  Location: [facility] ENDOSCOPY;  Service: Gastroenterology;;   BIOPSY  06/28/2021   Procedure: BIOPSY;  Surgeon: Jerene Bears, MD;  Location: [facility] ENDOSCOPY;  Service: Gastroenterology;;   BOWEL RESECTION N/A 05/24/2021   Procedure: SMALL BOWEL RESECTION;  Surgeon: Rolm Bookbinder, MD;  Location: [place];  Service: General;  Laterality: N/A;   CARDIAC CATHETERIZATION  05/2011   CARDIOVERSION N/A 06/05/2018   Procedure: CARDIOVERSION;  Surgeon: Jolaine Artist, MD;  Location: [place];  Service: Cardiovascular;  Laterality: N/A;   CARDIOVERSION N/A 10/06/2019   Procedure: CARDIOVERSION;  Surgeon: Jolaine Artist, MD;  Location: [facility] ENDOSCOPY;  Service: Cardiovascular;  Laterality: N/A;   CHOLECYSTECTOMY N/A 05/24/2021   Procedure: OPEN CHOLECYSTECTOMY;  Surgeon: Rolm Bookbinder, MD;  Location: [place];  Service: General;  Laterality: N/A;   COLONOSCOPY WITH PROPOFOL N/A 09/24/2017   Procedure: COLONOSCOPY WITH PROPOFOL;  Surgeon: Jackquline Denmark, MD;  Location: [place];  Service: Endoscopy;  Laterality: N/A;   ESOPHAGOGASTRODUODENOSCOPY N/A  09/19/2017   Procedure: ESOPHAGOGASTRODUODENOSCOPY (EGD);  Surgeon: Jackquline Denmark, MD;  Location: [facility] ENDOSCOPY;  Service: Endoscopy;  Laterality: N/A;   ESOPHAGOGASTRODUODENOSCOPY N/A 05/29/2021   Procedure: ESOPHAGOGASTRODUODENOSCOPY (EGD);  Surgeon: Milus Banister, MD;  Location: [facility] ENDOSCOPY;  Service: Gastroenterology;  Laterality: N/A;   ESOPHAGOGASTRODUODENOSCOPY (EGD) WITH PROPOFOL N/A 08/04/2020    Procedure: ESOPHAGOGASTRODUODENOSCOPY (EGD) WITH PROPOFOL;  Surgeon: Ladene Artist, MD;  Location: [place];  Service: Gastroenterology;  Laterality: N/A;   ESOPHAGOGASTRODUODENOSCOPY (EGD) WITH PROPOFOL N/A 06/28/2021   Procedure: ESOPHAGOGASTRODUODENOSCOPY (EGD) WITH PROPOFOL;  Surgeon: Jerene Bears, MD;  Location: [facility] ENDOSCOPY;  Service: Gastroenterology;  Laterality: N/A;   HEMOSTASIS CLIP PLACEMENT  05/29/2021   Procedure: HEMOSTASIS CLIP PLACEMENT;  Surgeon: Milus Banister, MD;  Location: [facility] ENDOSCOPY;  Service: Gastroenterology;;   HOT HEMOSTASIS N/A 05/29/2021   Procedure: HOT HEMOSTASIS (ARGON PLASMA COAGULATION/BICAP);  Surgeon: Milus Banister, MD;  Location: [facility] ENDOSCOPY;  Service: Gastroenterology;  Laterality: N/A;   HOT HEMOSTASIS N/A 06/28/2021   Procedure: HOT HEMOSTASIS (ARGON PLASMA COAGULATION/BICAP);  Surgeon: Jerene Bears, MD;  Location: [facility] ENDOSCOPY;  Service: Gastroenterology;  Laterality: N/A;   IR PERC TUN PERIT CATH WO PORT S&I /IMAG  05/23/2021   IR REMOVAL TUN CV CATH W/O FL  06/10/2021   IR US GUIDE VASC ACCESS RIGHT  05/23/2021   LAPAROSCOPY N/A 05/24/2021   Procedure: LAPAROSCOPY DIAGNOSTIC;  Surgeon: Rolm Bookbinder, MD;  Location: [place];  Service: General;  Laterality: N/A;   LAPAROTOMY N/A 05/24/2021   Procedure: EXPLORATORY LAPAROTOMY;  Surgeon: Rolm Bookbinder, MD;  Location: [place];  Service: General;  Laterality: N/A;   LEFT AND RIGHT HEART CATHETERIZATION WITH CORONARY ANGIOGRAM N/A 06/11/2011   Procedure: LEFT AND RIGHT HEART CATHETERIZATION WITH CORONARY ANGIOGRAM;  Surgeon: Larey Dresser, MD;  Location: [facility] CATH LAB;  Service: Cardiovascular;  Laterality: N/A;   PERIPHERAL VASCULAR BALLOON ANGIOPLASTY Right 07/29/2020   Procedure: PERIPHERAL VASCULAR BALLOON ANGIOPLASTY;  Surgeon: Angelia Mould, MD;  Location: [place] [facility];  Service: Cardiovascular;  Laterality: Right;  arm fistula   REVISON OF ARTERIOVENOUS FISTULA Right  08/08/2020   Procedure: ANEURYSM EXCISION OF RIGHT UPPER EXTREMITY ARTERIOVENOUS FISTULA;  Surgeon: Angelia Mould, MD;  Location: [facility] OR;  Service: Vascular;  Laterality: Right;   SCLEROTHERAPY  05/29/2021   Procedure: Clide Deutscher;  Surgeon: Milus Banister, MD;  Location: [facility] ENDOSCOPY;  Service: Gastroenterology;;   Lavell Islam REMOVAL  05/29/2021   Procedure: STENT REMOVAL;  Surgeon: Milus Banister, MD;  Location: [facility] ENDOSCOPY;  Service: Gastroenterology;;   TEE WITHOUT CARDIOVERSION N/A 12/27/2015   Procedure: TRANSESOPHAGEAL ECHOCARDIOGRAM (TEE);  Surgeon: Sueanne Margarita, MD;  Location: [facility] ENDOSCOPY;  Service: Cardiovascular;  Laterality: N/A;   Family History  Problem Relation Age of Onset   Alcohol abuse Mother    Liver disease Mother    Cirrhosis Mother    Heart attack Mother    Social History:  reports that he quit smoking about 5 years ago. His smoking use included cigarettes. He has a 20.50 pack-year smoking history. He has never used smokeless tobacco. He reports that he does not currently use alcohol after a past usage of about 3.0 standard drinks per week. He reports that he does not use drugs. No Known Allergies Prior to Admission medications   Medication Sig Start Date End Date Taking? Authorizing Provider  Acetaminophen (TYLENOL 8 HOUR PO) Patient takes two 500 mg tablets as needed for pain.   Yes [provider]  albuterol (VENTOLIN HFA) 108 (90 Base) MCG/ACT inhaler Inhale 2 puffs into the lungs every 6 (six) hours as needed for wheezing or shortness of breath. 05/09/20  Yes Freddi Starr, MD  allopurinol (ZYLOPRIM) 100 MG tablet TAKE 1 TABLET BY MOUTH EVERY DAY Patient taking differently: Take 100 mg by mouth daily. 07/13/21  Yes Dorena Dew, FNP  AURYXIA 1 GM 210 MG(Fe) tablet Take 420 mg by mouth 3 (three) times daily. 02/11/21  Yes [provider]  folic acid (FOLVITE) 1 MG tablet TAKE 1 TABLET BY MOUTH EVERY DAY Patient taking  differently: Take 1 mg by mouth daily. 02/22/21  Yes Dorena Dew, FNP  Glycerin-Hypromellose-PEG 400 (VISINE DRY EYE OP) Place 1 drop into both eyes as needed (dryness).   Yes [provider]  hydroxyurea (HYDREA) 500 MG capsule Take 1 capsule (500 mg total) by mouth daily. TAKE 1 CAPSULE BY MOUTH EVERY DAY WITH FOOD TO MINIMIZE GI SIDE EFFECTS Patient taking differently: Take 500 mg by mouth daily before breakfast. 01/31/21  Yes Dorena Dew, FNP  lidocaine-prilocaine (EMLA) cream Apply 1 application topically daily as needed (port access).  09/08/19  Yes [provider]  oxycodone (OXY-IR) 5 MG capsule Take 2 capsules (10 mg total) by mouth every 6 (six) hours as needed for pain. 07/31/21  Yes Dorena Dew, FNP  pantoprazole (PROTONIX) 40 MG tablet TAKE 1 TABLET BY MOUTH TWICE A DAY Patient taking differently: Take 40 mg by mouth 2 (two) times daily. 07/26/21  Yes Willia Craze, NP  tiZANidine (ZANAFLEX) 4 MG tablet Take 0.5 tablets (2 mg total) by mouth every 8 (eight) hours as needed for muscle spasms. 07/18/21  Yes Dorena Dew, FNP  torsemide (DEMADEX) 20 MG tablet Take 20 mg by mouth in the morning, at noon, in the evening, and at bedtime.   Yes [provider]  Vitamin D, Ergocalciferol, (DRISDOL) 1.25 MG (50000 UNIT) CAPS capsule TAKE 1 CAPSULE BY MOUTH ONE TIME PER WEEK Patient taking differently: Take 50,000 Units by mouth once a week. Monday 05/15/21  Yes Dorena Dew, FNP  carvedilol (COREG) 3.125 MG tablet Take 1 tablet (3.125 mg total) by mouth 2 (two) times daily. Patient not taking: Reported on 07/31/2021 07/17/21 10/15/21  Rafael Bihari, FNP  gabapentin (NEURONTIN) 100 MG capsule TAKE 1 CAPSULE BY MOUTH EVERYDAY AT BEDTIME Patient not taking: Reported on 07/31/2021 12/12/20   Dorena Dew, FNP   Current Facility-Administered Medications  Medication Dose Route Frequency Provider Last Rate Last Admin   0.9 %  sodium chloride  infusion  10 mL/hr Intravenous Once Sherrell Puller, PA-C       0.9 %  sodium chloride infusion  250 mL Intravenous PRN Chotiner, Yevonne Aline, MD       0.9 %  sodium chloride infusion  100 mL Intravenous PRN Madelon Lips, MD       0.9 %  sodium chloride infusion  100 mL Intravenous PRN Madelon Lips, MD       acetaminophen (TYLENOL) tablet 650 mg  650 mg Oral Q6H PRN Chotiner, Yevonne Aline, MD       Or   acetaminophen (TYLENOL) suppository 650 mg  650 mg Rectal Q6H PRN Chotiner, Yevonne Aline, MD       allopurinol (ZYLOPRIM) tablet 100 mg  100 mg Oral Daily Chotiner, Yevonne Aline, MD   100 mg at 08/01/21 0901   alteplase (CATHFLO ACTIVASE) injection 2 mg  2 mg Intracatheter Once PRN  Madelon Lips, MD       Chlorhexidine Gluconate Cloth 2 % PADS 6 each  6 each Topical Q0600 Madelon Lips, MD   6 each at 08/01/21 0353   ferric citrate (AURYXIA) tablet 420 mg  420 mg Oral TID with meals Chotiner, Yevonne Aline, MD   420 mg at 41/28/78 6767   folic acid (FOLVITE) tablet 1 mg  1 mg Oral Daily Chotiner, Yevonne Aline, MD   1 mg at 08/01/21 0901   heparin injection 1,000 Units  1,000 Units Dialysis PRN Madelon Lips, MD       HYDROmorphone (DILAUDID) injection 1 mg  1 mg Intravenous Q3H PRN Chotiner, Yevonne Aline, MD   1 mg at 08/01/21 0301   hydroxyurea (HYDREA) capsule 500 mg  500 mg Oral QAC breakfast Chotiner, Yevonne Aline, MD       lidocaine (PF) (XYLOCAINE) 1 % injection 5 mL  5 mL Intradermal PRN Madelon Lips, MD       lidocaine-prilocaine (EMLA) cream 1 application  1 application Topical PRN Madelon Lips, MD       ondansetron [facility]) tablet 4 mg  4 mg Oral Q6H PRN Chotiner, Yevonne Aline, MD       Or   ondansetron [facility]) injection 4 mg  4 mg Intravenous Q6H PRN Chotiner, Yevonne Aline, MD   4 mg at 08/01/21 2094   oxyCODONE (Oxy IR/ROXICODONE) immediate release tablet 10 mg  10 mg Oral Q6H PRN Chotiner, Yevonne Aline, MD       pantoprazole (PROTONIX) EC tablet 40 mg  40 mg Oral BID Chotiner, Yevonne Aline, MD    40 mg at 08/01/21 0901   pentafluoroprop-tetrafluoroeth (GEBAUERS) aerosol 1 application  1 application Topical PRN Madelon Lips, MD       senna-docusate (Senokot-S) tablet 1 tablet  1 tablet Oral QHS PRN Chotiner, Yevonne Aline, MD       sodium chloride flush (NS) 0.9 % injection 3 mL  3 mL Intravenous Q12H Chotiner, Yevonne Aline, MD   3 mL at 08/01/21 0901   sodium chloride flush (NS) 0.9 % injection 3 mL  3 mL Intravenous PRN Chotiner, Yevonne Aline, MD       Labs: Basic Metabolic Panel: Recent Labs  Lab 07/31/21 1611 07/31/21 1715 08/01/21 0645 08/01/21 0758  NA 129* 128* 130* 128*  K 4.2 4.0 4.3 4.9  CL 90* 90* 89* 90*  CO2 24  --  25 23  GLUCOSE 84 84 75 72  BUN 67* 62* 72* 75*  CREATININE 6.16* 7.80* 6.37* 6.26*  CALCIUM 8.7*  --  8.7* 8.7*  PHOS  --   --   --  4.9*   Liver Function Tests: Recent Labs  Lab 07/31/21 1611 08/01/21 0758  AST 14*  --   ALT 10  --   ALKPHOS 237*  --   BILITOT 1.4*  --   PROT 6.5  --   ALBUMIN <1.5* <1.5*   CBC: Recent Labs  Lab 07/31/21 1611 07/31/21 1715 08/01/21 0645 08/01/21 0758  WBC 8.9  --  10.5 11.6*  HGB 5.3* 5.8* 7.0* 7.6*  HCT 14.9* 17.0* 19.2* 20.7*  MCV 100.7*  --  92.8 91.6  PLT 227  --  218 223    ROS: All others negative except those listed in HPI.  Physical Exam: Vitals:   08/01/21 0841 08/01/21 1040 08/01/21 1059 08/01/21 1130  BP: (!) 92/59 (!) 87/59 (!) 85/56 (!) 87/59  Pulse: 77  77 85  Resp: 14 13 (!)  9 14  Temp: 97.6 F (36.4 C) (!) 97.5 F (36.4 C)    TempSrc: Oral Temporal    SpO2: 99% 100% 100% 100%  Weight:  59 kg    Height:         General: chronically ill appearing male in NAD Head: NCAT sclera not icteric MMM Neck: Supple. No lymphadenopathy Lungs: CTA bilaterally. No wheeze, rales or rhonchi. Breathing is unlabored. Heart: RRR. No murmur, rubs or gallops.  Abdomen: mildly distended, +tenderness, +BS Lower extremities:trace pedal edema, no edema in calves/thighs Neuro: AAOx3. Moves all  extremities spontaneously. Psych:  Responds to questions appropriately with a normal affect. Dialysis Access: RU AVF cannulated   Dialysis Orders:  TTS - AF  4hrs, BFR 400, DFR 500,  EDW 55.5kg, 3K/ 2Ca UFP2  Access: RU AVF  Heparin none Mircera 225 mcg q2wks - last 07/20/21 Hectorol 60mcg IV qHD   Sensipar 30mg  qHD  Assessment/Plan:  Symptomatic acute on chronic anemia of CKD - Hx recurrent GIB.  Plans for EGD tomorrow per GI.  Hgb 5.2 on admit s/p 2 unit pRBC, now 7.6.   On PPI BID.     ESRD -  on TTS.  HD today per regular schedule.   Hyponatremia - should improve with HD.  Hypertension/volume  - Hypotensive with HD today, give albumin for BP support.  Hold home meds.  No midodrine with prolonged QT. Does not appear volume overloaded.  Will try to get standing weights if able. Unable to pull fluid today due to hypotension.  UF as BP allows.   Secondary Hyperparathyroidism -  Calcium and phos in goal.  Continue sensipar, binders and hectorol.   Severe protein calorie malnutrition - renal diet w/fluid restrictions.  Alb <1.5, give protein supplements.  A fib - not on eliquis d/t GIB Recent SBO s/p surgery - per PMD Sickle cell anemia - chronic  Jen Mow, PA-C [facility] 08/01/2021, 11:43 AM

## 2021-08-01 NOTE — Progress Notes (Signed)
°  Transition of Care [facility]) Screening Note   Patient Details  Name: Patrick [name] Date of Birth: [birth year]   Transition of Care [facility]) CM/SW Contact:    Benard Halsted, LCSW Phone Number: 08/01/2021, 9:45 AM    Transition of Care Department [facility]) has reviewed patient and no TOC needs have been identified at this time. We will continue to monitor patient advancement through interdisciplinary progression rounds. If new patient transition needs arise, please place a TOC consult.

## 2021-08-01 NOTE — Progress Notes (Signed)
Progress Note   Patient: Patrick [name] FGH:[id number] DOB: [birth year] DOA: 07/31/2021     1 DOS: the patient was seen and examined on 08/01/2021   Brief hospital course: Patrick [name] is a 66 y.o. male with medical history significant of ESRD on HD, Sickle cell disease, CHF, A-fib, recent GI bleed who presents with complaint of shortness of breath with exertion and fatigue.  He reports for the last week he has had increasing shortness of breath with exertion and has had increasing weakness and fatigue.  Assessment and Plan: * Symptomatic anemia- (present on admission) Patrick [name] is admitted to medical telemetry.  S/p prbc transfusion and repeat Hemoglobin greater than 7.  GI consulted in view of recent history of GI bleed.  Last EGD last month showed hemorrhagic gastritis.  Plan for EGD in am by Dr Bryan Lemma.  Continue with PPI BID.  Monitor fluid status.  Monitor Hgb/Hct serially  Hypomagnesemia Given magnesium to replete.  Hyponatremia Chronic, from ESRD.  Nephrology following  Chronic systolic CHF (congestive heart failure) ([place])- (present on admission) Appears Euvolemic.  Pt had echo in Dec. 2022 that showed EF 40-45% with LVH and LV dysfunction, RV dysfunction and enlarged chambers Monitor Daily weights  Permanent atrial fibrillation ([place])- (present on admission) Rate is controlled, coreg is held due to soft BP parameters.  Pt is not anticoagulated due to recent GI bleed.   End stage renal disease ([place])- (present on admission) Nephrology consulted for HD as pt missed last session and is receiving PRBC transfusion. Plan for HD today.   Sickle cell anemia ([place])- (present on admission) Chronic. No pain crisis  Essential hypertension- (present on admission) Pt is no longer on coreg. Currently on no antihypertensives. BP has been soft in 80-90/55-65 range.  Monitor BP. Pt asymptomatic.         Wound care will be consulted.     Subjective: no new  complaints.   Physical Exam: Vitals:   08/01/21 0841 08/01/21 1040 08/01/21 1059 08/01/21 1130  BP: (!) 92/59 (!) 87/59 (!) 85/56 (!) 87/59  Pulse: 77  77 85  Resp: 14 13 (!) 9 14  Temp: 97.6 F (36.4 C) (!) 97.5 F (36.4 C)    TempSrc: Oral Temporal    SpO2: 99% 100% 100% 100%  Weight:  59 kg    Height:       General exam: Appears calm and comfortable  Respiratory system: Clear to auscultation. Respiratory effort normal. Cardiovascular system: S1 & S2 heard, RRR. No JVD, . No pedal edema. Gastrointestinal system: Abdomen is nondistended, soft and nontender.Normal bowel sounds heard. Central nervous system: Alert and oriented. No focal neurological deficits. Extremities: Symmetric 5 x 5 power. Skin: No rashes, lesions or ulcers Psychiatry:  Mood & affect appropriate.    Data Reviewed: Results for orders placed or performed during the hospital encounter of 07/31/21 (from the past 24 hour(s))  Comprehensive metabolic panel     Status: Abnormal   Collection Time: 07/31/21  4:11 PM  Result Value Ref Range   Sodium 129 (L) 135 - 145 mmol/L   Potassium 4.2 3.5 - 5.1 mmol/L   Chloride 90 (L) 98 - 111 mmol/L   CO2 24 22 - 32 mmol/L   Glucose, Bld 84 70 - 99 mg/dL   BUN 67 (H) 8 - 23 mg/dL   Creatinine, Ser 6.16 (H) 0.61 - 1.24 mg/dL   Calcium 8.7 (L) 8.9 - 10.3 mg/dL   Total Protein 6.5 6.5 - 8.1 g/dL  Albumin <1.5 (L) 3.5 - 5.0 g/dL   AST 14 (L) 15 - 41 U/L   ALT 10 0 - 44 U/L   Alkaline Phosphatase 237 (H) 38 - 126 U/L   Total Bilirubin 1.4 (H) 0.3 - 1.2 mg/dL   GFR, Estimated 10 (L) >60 mL/min   Anion gap 15 5 - 15  CBC     Status: Abnormal   Collection Time: 07/31/21  4:11 PM  Result Value Ref Range   WBC 8.9 4.0 - 10.5 K/uL   RBC 1.48 (L) 4.22 - 5.81 MIL/uL   Hemoglobin 5.3 (LL) 13.0 - 17.0 g/dL   HCT 14.9 (L) 39.0 - 52.0 %   MCV 100.7 (H) 80.0 - 100.0 fL   MCH 35.8 (H) 26.0 - 34.0 pg   MCHC 35.6 30.0 - 36.0 g/dL   RDW 25.6 (H) 11.5 - 15.5 %   Platelets 227 150  - 400 K/uL   nRBC 6.5 (H) 0.0 - 0.2 %  Type and screen [place]     Status: None   Collection Time: 07/31/21  4:11 PM  Result Value Ref Range   ABO/RH(D) AB POS    Antibody Screen NEG    Sample Expiration 08/03/2021,2359    Unit Number [id number]    Blood Component Type RED CELLS,LR    Unit division 00    Status of Unit ISSUED,FINAL    Transfusion Status OK TO TRANSFUSE    Crossmatch Result Compatible    Donor AG Type      NEGATIVE FOR C ANTIGEN NEGATIVE FOR E ANTIGEN NEGATIVE FOR KELL ANTIGEN Performed at [facility], [place]. [address], [place], [place] [address]    Unit Number [id number]    Blood Component Type RED CELLS,LR    Unit division 00    Status of Unit ISSUED,FINAL    Transfusion Status OK TO TRANSFUSE    Crossmatch Result Compatible    Donor AG Type      NEGATIVE FOR KELL ANTIGEN NEGATIVE FOR C ANTIGEN NEGATIVE FOR E ANTIGEN  Magnesium     Status: Abnormal   Collection Time: 07/31/21  4:11 PM  Result Value Ref Range   Magnesium 1.5 (L) 1.7 - 2.4 mg/dL  I-stat chem 8, ED (not at [facility] or [facility])     Status: Abnormal   Collection Time: 07/31/21  5:15 PM  Result Value Ref Range   Sodium 128 (L) 135 - 145 mmol/L   Potassium 4.0 3.5 - 5.1 mmol/L   Chloride 90 (L) 98 - 111 mmol/L   BUN 62 (H) 8 - 23 mg/dL   Creatinine, Ser 7.80 (H) 0.61 - 1.24 mg/dL   Glucose, Bld 84 70 - 99 mg/dL   Calcium, Ion 1.10 (L) 1.15 - 1.40 mmol/L   TCO2 25 22 - 32 mmol/L   Hemoglobin 5.8 (LL) 13.0 - 17.0 g/dL   HCT 17.0 (L) 39.0 - 52.0 %   Comment NOTIFIED PHYSICIAN   Prepare RBC (crossmatch)     Status: None   Collection Time: 07/31/21  5:15 PM  Result Value Ref Range   Order Confirmation      ORDER PROCESSED BY BLOOD BANK Performed at [facility], [address] [place], [place] [address]   POC occult blood, ED     Status: None   Collection Time: 07/31/21  6:11 PM  Result Value Ref Range   Fecal Occult Bld NEGATIVE NEGATIVE  MRSA Next Gen by  PCR, Nasal  Status: None   Collection Time: 08/01/21  5:54 AM   Specimen: Nasal Mucosa; Nasal Swab  Result Value Ref Range   MRSA by PCR Next Gen NOT DETECTED NOT DETECTED  Basic metabolic panel     Status: Abnormal   Collection Time: 08/01/21  6:45 AM  Result Value Ref Range   Sodium 130 (L) 135 - 145 mmol/L   Potassium 4.3 3.5 - 5.1 mmol/L   Chloride 89 (L) 98 - 111 mmol/L   CO2 25 22 - 32 mmol/L   Glucose, Bld 75 70 - 99 mg/dL   BUN 72 (H) 8 - 23 mg/dL   Creatinine, Ser 6.37 (H) 0.61 - 1.24 mg/dL   Calcium 8.7 (L) 8.9 - 10.3 mg/dL   GFR, Estimated 9 (L) >60 mL/min   Anion gap 16 (H) 5 - 15  Hepatitis B surface antibody,qualitative     Status: Abnormal   Collection Time: 08/01/21  6:45 AM  Result Value Ref Range   Hep B S Ab Reactive (A) NON REACTIVE  Hepatitis B surface antigen     Status: None   Collection Time: 08/01/21  6:45 AM  Result Value Ref Range   Hepatitis B Surface Ag NON REACTIVE NON REACTIVE  CBC     Status: Abnormal   Collection Time: 08/01/21  6:45 AM  Result Value Ref Range   WBC 10.5 4.0 - 10.5 K/uL   RBC 2.07 (L) 4.22 - 5.81 MIL/uL   Hemoglobin 7.0 (L) 13.0 - 17.0 g/dL   HCT 19.2 (L) 39.0 - 52.0 %   MCV 92.8 80.0 - 100.0 fL   MCH 33.8 26.0 - 34.0 pg   MCHC 36.5 (H) 30.0 - 36.0 g/dL   RDW 21.7 (H) 11.5 - 15.5 %   Platelets 218 150 - 400 K/uL   nRBC 5.6 (H) 0.0 - 0.2 %  Renal function panel     Status: Abnormal   Collection Time: 08/01/21  7:58 AM  Result Value Ref Range   Sodium 128 (L) 135 - 145 mmol/L   Potassium 4.9 3.5 - 5.1 mmol/L   Chloride 90 (L) 98 - 111 mmol/L   CO2 23 22 - 32 mmol/L   Glucose, Bld 72 70 - 99 mg/dL   BUN 75 (H) 8 - 23 mg/dL   Creatinine, Ser 6.26 (H) 0.61 - 1.24 mg/dL   Calcium 8.7 (L) 8.9 - 10.3 mg/dL   Phosphorus 4.9 (H) 2.5 - 4.6 mg/dL   Albumin <1.5 (L) 3.5 - 5.0 g/dL   GFR, Estimated 9 (L) >60 mL/min   Anion gap 15 5 - 15  CBC     Status: Abnormal   Collection Time: 08/01/21  7:58 AM  Result Value Ref Range    WBC 11.6 (H) 4.0 - 10.5 K/uL   RBC 2.26 (L) 4.22 - 5.81 MIL/uL   Hemoglobin 7.6 (L) 13.0 - 17.0 g/dL   HCT 20.7 (L) 39.0 - 52.0 %   MCV 91.6 80.0 - 100.0 fL   MCH 33.6 26.0 - 34.0 pg   MCHC 36.7 (H) 30.0 - 36.0 g/dL   RDW 22.3 (H) 11.5 - 15.5 %   Platelets 223 150 - 400 K/uL   nRBC 5.5 (H) 0.0 - 0.2 %     Family Communication: None at bedside.   Disposition: Status is: Inpatient Remains inpatient appropriate because: anemia work up.           Planned Discharge Destination: Home     Time spent: 39 minutes.  Author: Hosie Poisson, MD 08/01/2021 12:03 PM  For on call review [email or web address].

## 2021-08-01 NOTE — ED Notes (Signed)
5W called for purpleman

## 2021-08-01 NOTE — Plan of Care (Signed)
°  Problem: Education: Goal: Knowledge of General Education information will improve Description: Including pain rating scale, medication(s)/side effects and non-pharmacologic comfort measures Outcome: Progressing   Problem: Clinical Measurements: Goal: Ability to maintain clinical measurements within normal limits will improve Outcome: Progressing   Problem: Clinical Measurements: Goal: Cardiovascular complication will be avoided Outcome: Progressing   Problem: Clinical Measurements: Goal: Respiratory complications will improve Outcome: Progressing   Problem: Nutrition: Goal: Adequate nutrition will be maintained Outcome: Progressing   Problem: Safety: Goal: Ability to remain free from injury will improve Outcome: Progressing   Problem: Pain Managment: Goal: General experience of comfort will improve Outcome: Progressing   Problem: Safety: Goal: Ability to remain free from injury will improve Outcome: Progressing   Problem: Skin Integrity: Goal: Risk for impaired skin integrity will decrease Outcome: Progressing

## 2021-08-01 NOTE — H&P (View-Only) (Signed)
Attending physician's note   I have taken a history, reviewed the chart, and examined the patient. I performed a substantive portion of this encounter, including complete performance of at least one of the key components, in conjunction with the APP. I agree with the APP's note, impression, and recommendations with my edits.   66 year old male with history of ESRD on HD, sickle cell, CHF, atrial fibrillation (no longer taking anticoagulation), pulmonary hypertension, legally blind, readmitted with symptomatic anemia (SOB, DOE, fatigue) and nausea/vomiting for the last few days.  Admission evaluation notable for the following:  - H/H 5.3/15 (was 7.5/21.5 on discharge on 06/29/2021) - MCV/RDW 100.7/25 (was 86/19 last month on discharge) - FOBT negative - H/H 7.6/20.7 after 2 unit PRBCs, with third unit pending  Baseline Hgb 7-8.  Was dialyzed earlier today.  He has a history of UGIB on multiple recent endoscopies to include the following: - EGD (05/29/2021): LA grade C esophagitis, oozing AVM in the lesser curve of the stomach treated with APC, clips x3, epinephrine.  Removed biliary and pancreatic stents at that time - EGD (06/28/2021): Candida esophagitis, LA grade B esophagitis, Schatzki's ring, 3 cm HH.  Diffuse moderate inflammation in the proximal stomach with oozing in the gastric cardia treated with APC.  Treated with high-dose PPI and sucralfate (only took for a couple weeks due to ESRD) - Has not restarted Eliquis since hospital discharge last month  Additionally, was admitted in [place] in December with choledocholithiasis treated with ERCP with pancreatic and biliary stents (since removed as above) followed by open cholecystectomy and resection of ischemic small bowel on 05/24/2021.  1) Acute on chronic anemia 2) Nausea/vomiting 3) GERD with erosive esophagitis 4) Gastric AVMs and erosive gastritis - Continue high-dose PPI.  Change to Protonix 40 mg IV bid due to  nausea/vomiting - Clears okay if tolerated then n.p.o. midnight - EGD tomorrow for diagnostic and therapeutic intent - No longer taking Eliquis - Posttransfusion CBC check with additional blood products as needed per protocol  5) ESRD - Was dialyzed earlier today  6) CHF 7) A-fib - As above, no longer taking anticoagulation due to history of GI bleed - Rate controlled  The indications, risks, and benefits of EGD were explained to the patient in detail. Risks include but are not limited to bleeding, perforation, adverse reaction to medications, and cardiopulmonary compromise. Sequelae include but are not limited to the possibility of surgery, hospitalization, and mortality. The patient verbalized understanding and wished to proceed. All questions answered. Further recommendations pending results of the exam.    Gerrit Heck, DO, FACG [phone] office          Consultation  Referring Provider: Dr. Karleen Hampshire     Primary Care Physician:  Dorena Dew, FNP Primary Gastroenterologist: Dr. Hilarie Fredrickson      Reason for Consultation:  Acute on Chronic Anemia            HPI:   Patrick [name] is a 66 y.o. male with a past medical history significant for ESRD on HD, sickle cell disease, CHF, A-fib and recent GI bleed, who returns to the ER on 07/31/2021 with shortness of breath on exertion and fatigue.    Today, the patient explains that over the past week he has had increasing shortness of breath and weakness and fatigue noting that he can hardly do anything.  He last went to hemodialysis on Thursday, 07/27/2021 and felt too weak to go again on Saturday.  Describes that he  has had some vomiting telling me that food will just "come back up", this is almost every time he has eaten over the past 2 to 3 days.  Sometimes it will be down there for 10 to 15 minutes and other times it does not feel like it makes it very far.  Tells me he has had regular brown bowel movements and no real abdominal  pain, though does describe some low back pain when standing for long periods of time.  Has continued on his Pantoprazole 40 twice daily since discharge.    Denies fever, chills or weight loss.  ER course: Hemoglobin 5.2--> 2 units PRBCs ordered  GI History: 07/19/2021 office visit-follow-up after recent hospital admission for acute on chronic anemia found to have hemorrhagic gastritis, at that time was doing well.  He had been taking his twice daily PPI with no evidence of recurrent bleeding, also discussed recent choledocholithiasis status post ERCP with stone removal/enterotomy/CBD and PD stent placement in [place] with stents removed during EGD while hospitalized in [facility] December 2022, also discussed recent small bowel obstruction for SBO and open cholecystectomy (see that office visit note for further details) 06/28/2021 EGD with hemorrhagic gastritis-Eliquis stopped at that time  Past Medical History:  Diagnosis Date   Blood dyscrasia    Blood transfusion    "I've had a bunch of them"   CHF (congestive heart failure) ([place])    followed by hf clinic   Chronic kidney disease (CKD), stage III (moderate) ([place])    Dialysis patient ([place]) 04/2019   AV fistula (right arm)   Gout    Headache(784.0)    "probably weekly" (01/13/2014)   Legally blind    Shortness of breath dyspnea    Sickle cell disease, type SS ([place])    Swelling abdomen    Swelling of extremity, left    Swelling of extremity, right     Past Surgical History:  Procedure Laterality Date   A/V FISTULAGRAM Right 07/29/2020   Procedure: A/V FISTULAGRAM - Right Arm;  Surgeon: Angelia Mould, MD;  Location: [facility];  Service: Cardiovascular;  Laterality: Right;   BASCILIC VEIN TRANSPOSITION Right 04/12/2014   Procedure: BASILIC VEIN TRANSPOSITION;  Surgeon: Elam Dutch, MD;  Location: [place];  Service: Vascular;  Laterality: Right;   BIOPSY  08/04/2020   Procedure: BIOPSY;  Surgeon: Ladene Artist,  MD;  Location[facility] ENDOSCOPY;  Service: Gastroenterology;;   BIOPSY  06/28/2021   Procedure: BIOPSY;  Surgeon: Jerene Bears, MD;  Location: [facility] ENDOSCOPY;  Service: Gastroenterology;;   BOWEL RESECTION N/A 05/24/2021   Procedure: SMALL BOWEL RESECTION;  Surgeon: Rolm Bookbinder, MD;  Location: [place];  Service: General;  Laterality: N/A;   CARDIAC CATHETERIZATION  05/2011   CARDIOVERSION N/A 06/05/2018   Procedure: CARDIOVERSION;  Surgeon: Jolaine Artist, MD;  Location: [place];  Service: Cardiovascular;  Laterality: N/A;   CARDIOVERSION N/A 10/06/2019   Procedure: CARDIOVERSION;  Surgeon: Jolaine Artist, MD;  Location: [facility] ENDOSCOPY;  Service: Cardiovascular;  Laterality: N/A;   CHOLECYSTECTOMY N/A 05/24/2021   Procedure: OPEN CHOLECYSTECTOMY;  Surgeon: Rolm Bookbinder, MD;  Location: [place];  Service: General;  Laterality: N/A;   COLONOSCOPY WITH PROPOFOL N/A 09/24/2017   Procedure: COLONOSCOPY WITH PROPOFOL;  Surgeon: Jackquline Denmark, MD;  Location: [place];  Service: Endoscopy;  Laterality: N/A;   ESOPHAGOGASTRODUODENOSCOPY N/A 09/19/2017   Procedure: ESOPHAGOGASTRODUODENOSCOPY (EGD);  Surgeon: Jackquline Denmark, MD;  Location: [facility] ENDOSCOPY;  Service: Endoscopy;  Laterality: N/A;  ESOPHAGOGASTRODUODENOSCOPY N/A 05/29/2021   Procedure: ESOPHAGOGASTRODUODENOSCOPY (EGD);  Surgeon: Milus Banister, MD;  Location: [facility] ENDOSCOPY;  Service: Gastroenterology;  Laterality: N/A;   ESOPHAGOGASTRODUODENOSCOPY (EGD) WITH PROPOFOL N/A 08/04/2020   Procedure: ESOPHAGOGASTRODUODENOSCOPY (EGD) WITH PROPOFOL;  Surgeon: Ladene Artist, MD;  Location: [place];  Service: Gastroenterology;  Laterality: N/A;   ESOPHAGOGASTRODUODENOSCOPY (EGD) WITH PROPOFOL N/A 06/28/2021   Procedure: ESOPHAGOGASTRODUODENOSCOPY (EGD) WITH PROPOFOL;  Surgeon: Jerene Bears, MD;  Location: [facility] ENDOSCOPY;  Service: Gastroenterology;  Laterality: N/A;   HEMOSTASIS CLIP PLACEMENT  05/29/2021   Procedure: HEMOSTASIS CLIP  PLACEMENT;  Surgeon: Milus Banister, MD;  Location: [facility] ENDOSCOPY;  Service: Gastroenterology;;   HOT HEMOSTASIS N/A 05/29/2021   Procedure: HOT HEMOSTASIS (ARGON PLASMA COAGULATION/BICAP);  Surgeon: Milus Banister, MD;  Location: [facility] ENDOSCOPY;  Service: Gastroenterology;  Laterality: N/A;   HOT HEMOSTASIS N/A 06/28/2021   Procedure: HOT HEMOSTASIS (ARGON PLASMA COAGULATION/BICAP);  Surgeon: Jerene Bears, MD;  Location: [facility] ENDOSCOPY;  Service: Gastroenterology;  Laterality: N/A;   IR PERC TUN PERIT CATH WO PORT S&I /IMAG  05/23/2021   IR REMOVAL TUN CV CATH W/O FL  06/10/2021   IR US GUIDE VASC ACCESS RIGHT  05/23/2021   LAPAROSCOPY N/A 05/24/2021   Procedure: LAPAROSCOPY DIAGNOSTIC;  Surgeon: Rolm Bookbinder, MD;  Location: [place];  Service: General;  Laterality: N/A;   LAPAROTOMY N/A 05/24/2021   Procedure: EXPLORATORY LAPAROTOMY;  Surgeon: Rolm Bookbinder, MD;  Location: [place];  Service: General;  Laterality: N/A;   LEFT AND RIGHT HEART CATHETERIZATION WITH CORONARY ANGIOGRAM N/A 06/11/2011   Procedure: LEFT AND RIGHT HEART CATHETERIZATION WITH CORONARY ANGIOGRAM;  Surgeon: Larey Dresser, MD;  Location: [facility] CATH LAB;  Service: Cardiovascular;  Laterality: N/A;   PERIPHERAL VASCULAR BALLOON ANGIOPLASTY Right 07/29/2020   Procedure: PERIPHERAL VASCULAR BALLOON ANGIOPLASTY;  Surgeon: Angelia Mould, MD;  Location: [facility];  Service: Cardiovascular;  Laterality: Right;  arm fistula   REVISON OF ARTERIOVENOUS FISTULA Right 08/08/2020   Procedure: ANEURYSM EXCISION OF RIGHT UPPER EXTREMITY ARTERIOVENOUS FISTULA;  Surgeon: Angelia Mould, MD;  Location: [facility] OR;  Service: Vascular;  Laterality: Right;   SCLEROTHERAPY  05/29/2021   Procedure: Clide Deutscher;  Surgeon: Milus Banister, MD;  Location: [facility] ENDOSCOPY;  Service: Gastroenterology;;   Lavell Islam REMOVAL  05/29/2021   Procedure: STENT REMOVAL;  Surgeon: Milus Banister, MD;  Location[facility]COPY;  Service:  Gastroenterology;;   TEE WITHOUT CARDIOVERSION N/A 12/27/2015   Procedure: TRANSESOPHAGEAL ECHOCARDIOGRAM (TEE);  Surgeon: Sueanne Margarita, MD;  Location: [facility] ENDOSCOPY;  Service: Cardiovascular;  Laterality: N/A;    Family History  Problem Relation Age of Onset   Alcohol abuse Mother    Liver disease Mother    Cirrhosis Mother    Heart attack Mother     Social History   Tobacco Use   Smoking status: Former    Packs/day: 0.50    Years: 41.00    Pack years: 20.50    Types: Cigarettes    Quit date: 12/08/2015    Years since quitting: 5.6   Smokeless tobacco: Never  Vaping Use   Vaping Use: Never used  Substance Use Topics   Alcohol use: Not Currently    Alcohol/week: 3.0 standard drinks    Types: 1 Glasses of wine, 2 Cans of beer per week    Comment: occasional   Drug use: No    Prior to Admission medications   Medication Sig Start Date End Date Taking? Authorizing Provider  Acetaminophen (TYLENOL 8 HOUR PO) Patient takes two 500 mg tablets as needed for pain.   Yes [provider]  albuterol (VENTOLIN HFA) 108 (90 Base) MCG/ACT inhaler Inhale 2 puffs into the lungs every 6 (six) hours as needed for wheezing or shortness of breath. 05/09/20  Yes Freddi Starr, MD  allopurinol (ZYLOPRIM) 100 MG tablet TAKE 1 TABLET BY MOUTH EVERY DAY Patient taking differently: Take 100 mg by mouth daily. 07/13/21  Yes Dorena Dew, FNP  AURYXIA 1 GM 210 MG(Fe) tablet Take 420 mg by mouth 3 (three) times daily. 02/11/21  Yes [provider]  folic acid (FOLVITE) 1 MG tablet TAKE 1 TABLET BY MOUTH EVERY DAY Patient taking differently: Take 1 mg by mouth daily. 02/22/21  Yes Dorena Dew, FNP  Glycerin-Hypromellose-PEG 400 (VISINE DRY EYE OP) Place 1 drop into both eyes as needed (dryness).   Yes [provider]  hydroxyurea (HYDREA) 500 MG capsule Take 1 capsule (500 mg total) by mouth daily. TAKE 1 CAPSULE BY MOUTH EVERY DAY WITH FOOD TO MINIMIZE GI SIDE  EFFECTS Patient taking differently: Take 500 mg by mouth daily before breakfast. 01/31/21  Yes Dorena Dew, FNP  lidocaine-prilocaine (EMLA) cream Apply 1 application topically daily as needed (port access).  09/08/19  Yes [provider]  oxycodone (OXY-IR) 5 MG capsule Take 2 capsules (10 mg total) by mouth every 6 (six) hours as needed for pain. 07/31/21  Yes Dorena Dew, FNP  pantoprazole (PROTONIX) 40 MG tablet TAKE 1 TABLET BY MOUTH TWICE A DAY Patient taking differently: Take 40 mg by mouth 2 (two) times daily. 07/26/21  Yes Willia Craze, NP  tiZANidine (ZANAFLEX) 4 MG tablet Take 0.5 tablets (2 mg total) by mouth every 8 (eight) hours as needed for muscle spasms. 07/18/21  Yes Dorena Dew, FNP  torsemide (DEMADEX) 20 MG tablet Take 20 mg by mouth in the morning, at noon, in the evening, and at bedtime.   Yes [provider]  Vitamin D, Ergocalciferol, (DRISDOL) 1.25 MG (50000 UNIT) CAPS capsule TAKE 1 CAPSULE BY MOUTH ONE TIME PER WEEK Patient taking differently: Take 50,000 Units by mouth once a week. Monday 05/15/21  Yes Dorena Dew, FNP  carvedilol (COREG) 3.125 MG tablet Take 1 tablet (3.125 mg total) by mouth 2 (two) times daily. Patient not taking: Reported on 07/31/2021 07/17/21 10/15/21  Rafael Bihari, FNP  gabapentin (NEURONTIN) 100 MG capsule TAKE 1 CAPSULE BY MOUTH EVERYDAY AT BEDTIME Patient not taking: Reported on 07/31/2021 12/12/20   Dorena Dew, FNP    Current Facility-Administered Medications  Medication Dose Route Frequency Provider Last Rate Last Admin   0.9 %  sodium chloride infusion  10 mL/hr Intravenous Once Sherrell Puller, PA-C       0.9 %  sodium chloride infusion  250 mL Intravenous PRN Chotiner, Yevonne Aline, MD       0.9 %  sodium chloride infusion  100 mL Intravenous PRN Madelon Lips, MD       0.9 %  sodium chloride infusion  100 mL Intravenous PRN Madelon Lips, MD       acetaminophen (TYLENOL) tablet 650 mg   650 mg Oral Q6H PRN Chotiner, Yevonne Aline, MD       Or   acetaminophen (TYLENOL) suppository 650 mg  650 mg Rectal Q6H PRN Chotiner, Yevonne Aline, MD       allopurinol (ZYLOPRIM) tablet 100 mg  100 mg Oral Daily Chotiner, Leory Plowman  S, MD   100 mg at 08/01/21 0901   alteplase (CATHFLO ACTIVASE) injection 2 mg  2 mg Intracatheter Once PRN Madelon Lips, MD       Chlorhexidine Gluconate Cloth 2 % PADS 6 each  6 each Topical Q0600 Madelon Lips, MD   6 each at 08/01/21 0353   ferric citrate (AURYXIA) tablet 420 mg  420 mg Oral TID with meals Chotiner, Yevonne Aline, MD   420 mg at 89/38/10 1751   folic acid (FOLVITE) tablet 1 mg  1 mg Oral Daily Chotiner, Yevonne Aline, MD   1 mg at 08/01/21 0901   heparin injection 1,000 Units  1,000 Units Dialysis PRN Madelon Lips, MD       HYDROmorphone (DILAUDID) injection 1 mg  1 mg Intravenous Q3H PRN Chotiner, Yevonne Aline, MD   1 mg at 08/01/21 0301   hydroxyurea (HYDREA) capsule 500 mg  500 mg Oral QAC breakfast Chotiner, Yevonne Aline, MD       lidocaine (PF) (XYLOCAINE) 1 % injection 5 mL  5 mL Intradermal PRN Madelon Lips, MD       lidocaine-prilocaine (EMLA) cream 1 application  1 application Topical PRN Madelon Lips, MD       ondansetron [facility]) tablet 4 mg  4 mg Oral Q6H PRN Chotiner, Yevonne Aline, MD       Or   ondansetron [facility]) injection 4 mg  4 mg Intravenous Q6H PRN Chotiner, Yevonne Aline, MD   4 mg at 08/01/21 0258   oxyCODONE (Oxy IR/ROXICODONE) immediate release tablet 10 mg  10 mg Oral Q6H PRN Chotiner, Yevonne Aline, MD       pantoprazole (PROTONIX) EC tablet 40 mg  40 mg Oral BID Chotiner, Yevonne Aline, MD   40 mg at 08/01/21 0901   pentafluoroprop-tetrafluoroeth (GEBAUERS) aerosol 1 application  1 application Topical PRN Madelon Lips, MD       senna-docusate (Senokot-S) tablet 1 tablet  1 tablet Oral QHS PRN Chotiner, Yevonne Aline, MD       sodium chloride flush (NS) 0.9 % injection 3 mL  3 mL Intravenous Q12H Chotiner, Yevonne Aline, MD   3 mL at 08/01/21  0901   sodium chloride flush (NS) 0.9 % injection 3 mL  3 mL Intravenous PRN Chotiner, Yevonne Aline, MD        Allergies as of 07/31/2021   (No Known Allergies)     Review of Systems:    Constitutional: No weight loss, fever or chills Skin: No rash Cardiovascular: No chest pain Respiratory: No SOB Gastrointestinal: See HPI and otherwise negative Genitourinary: No dysuria  Neurological: No headache, dizziness or syncope Musculoskeletal: No new muscle or joint pain Hematologic: No bleeding or bruising Psychiatric: No history of depression or anxiety    Physical Exam:  Vital signs in last 24 hours: Temp:  [96 F (35.6 C)-97.8 F (36.6 C)] 97.6 F (36.4 C) (02/14 0841) Pulse Rate:  [42-134] 77 (02/14 0841) Resp:  [9-23] 14 (02/14 0841) BP: (80-107)/(51-76) 92/59 (02/14 0841) SpO2:  [98 %-100 %] 99 % (02/14 0841) Weight:  [58.7 kg] 58.7 kg (02/14 0544) Last BM Date : 07/30/21 General:   Pleasant AA male appears to be in NAD, Well developed, Well nourished, alert and cooperative Head:  Normocephalic and atraumatic. Eyes:   PEERL, EOMI. No icterus. Conjunctiva pink. Ears:  Normal auditory acuity. Neck:  Supple Throat: Oral cavity and pharynx without inflammation, swelling or lesion.  Lungs: Respirations even and unlabored. Lungs clear to auscultation bilaterally.  No wheezes, crackles, or rhonchi.  Heart: Normal S1, S2. No MRG. Regular rate and rhythm. No peripheral edema, cyanosis or pallor.  Abdomen:  Soft, nondistended, nontender. No rebound or guarding. Increased bowel sounds all four quadrants. No appreciable masses or hepatomegaly. Rectal:  Not performed.  Msk:  Symmetrical without gross deformities. Peripheral pulses intact.  Extremities:  Without edema, no deformity or joint abnormality.  Neurologic:  Alert and  oriented x4;  grossly normal neurologically.  Skin:   Dry and intact without significant lesions or rashes. Psychiatric: Demonstrates good judgement and reason  without abnormal affect or behaviors.  LAB RESULTS: Recent Labs    07/31/21 1611 07/31/21 1715 08/01/21 0645 08/01/21 0758  WBC 8.9  --  10.5 11.6*  HGB 5.3* 5.8* 7.0* 7.6*  HCT 14.9* 17.0* 19.2* 20.7*  PLT 227  --  218 223   BMET Recent Labs    07/31/21 1611 07/31/21 1715 08/01/21 0645 08/01/21 0758  NA 129* 128* 130* 128*  K 4.2 4.0 4.3 4.9  CL 90* 90* 89* 90*  CO2 24  --  25 23  GLUCOSE 84 84 75 72  BUN 67* 62* 72* 75*  CREATININE 6.16* 7.80* 6.37* 6.26*  CALCIUM 8.7*  --  8.7* 8.7*   LFT Recent Labs    07/31/21 1611 08/01/21 0758  PROT 6.5  --   ALBUMIN <1.5* <1.5*  AST 14*  --   ALT 10  --   ALKPHOS 237*  --   BILITOT 1.4*  --      Impression / Plan:   Impression: 1.  Symptomatic acute on chronic iron deficiency anemia: Interestingly fecal occult negative, hemoglobin 5.3 at admission--> 2 units PRBCs--> 7.6 (06/29/2021 7.5), patient has not seen any black looking stool, the only new GI symptom is some vomiting of food over the past 2 days, continued on Pantoprazole 40 twice daily last EGD in January with hemorrhagic gastritis treated with APC; likely known hemorrhagic gastritis 2.  ESRD on HD 3.  Chronic systolic CHF: Last echo December 2022 with EF 40-45% 4.  A-fib: Rate controlled, Eliquis stopped at last admission due to GI bleed  Plan: 1.  Patient scheduled for an EGD tomorrow with Dr. Bryan Lemma.  Did discuss risks, benefits, limitations and alternatives and the patient agrees to proceed. 2.  Patient can be on heart healthy diet today as tolerated and NPO at midnight 3.  Continue Pantoprazole 40mg  PO BID 4.  Continue to monitor hemoglobin with transfusion as needed less than 7  Thank you for your kind consultation, we will continue to follow.  Lavone Nian [facility]  08/01/2021, 10:23 AM

## 2021-08-01 NOTE — Progress Notes (Addendum)
Attending physician's note   I have taken a history, reviewed the chart, and examined the patient. I performed a substantive portion of this encounter, including complete performance of at least one of the key components, in conjunction with the APP. I agree with the APP's note, impression, and recommendations with my edits.   66 year old male with history of ESRD on HD, sickle cell, CHF, atrial fibrillation (no longer taking anticoagulation), pulmonary hypertension, legally blind, readmitted with symptomatic anemia (SOB, DOE, fatigue) and nausea/vomiting for the last few days.  Admission evaluation notable for the following:  - H/H 5.3/15 (was 7.5/21.5 on discharge on 06/29/2021) - MCV/RDW 100.7/25 (was 86/19 last month on discharge) - FOBT negative - H/H 7.6/20.7 after 2 unit PRBCs, with third unit pending  Baseline Hgb 7-8.  Was dialyzed earlier today.  He has a history of UGIB on multiple recent endoscopies to include the following: - EGD (05/29/2021): LA grade C esophagitis, oozing AVM in the lesser curve of the stomach treated with APC, clips x3, epinephrine.  Removed biliary and pancreatic stents at that time - EGD (06/28/2021): Candida esophagitis, LA grade B esophagitis, Schatzki's ring, 3 cm HH.  Diffuse moderate inflammation in the proximal stomach with oozing in the gastric cardia treated with APC.  Treated with high-dose PPI and sucralfate (only took for a couple weeks due to ESRD) - Has not restarted Eliquis since hospital discharge last month  Additionally, was admitted in [place] in December with choledocholithiasis treated with ERCP with pancreatic and biliary stents (since removed as above) followed by open cholecystectomy and resection of ischemic small bowel on 05/24/2021.  1) Acute on chronic anemia 2) Nausea/vomiting 3) GERD with erosive esophagitis 4) Gastric AVMs and erosive gastritis - Continue high-dose PPI.  Change to Protonix 40 mg IV bid due to  nausea/vomiting - Clears okay if tolerated then n.p.o. midnight - EGD tomorrow for diagnostic and therapeutic intent - No longer taking Eliquis - Posttransfusion CBC check with additional blood products as needed per protocol  5) ESRD - Was dialyzed earlier today  6) CHF 7) A-fib - As above, no longer taking anticoagulation due to history of GI bleed - Rate controlled  The indications, risks, and benefits of EGD were explained to the patient in detail. Risks include but are not limited to bleeding, perforation, adverse reaction to medications, and cardiopulmonary compromise. Sequelae include but are not limited to the possibility of surgery, hospitalization, and mortality. The patient verbalized understanding and wished to proceed. All questions answered. Further recommendations pending results of the exam.    Gerrit Heck, DO, FACG [phone] office          Consultation  Referring Provider: Dr. Karleen Hampshire     Primary Care Physician:  Dorena Dew, FNP Primary Gastroenterologist: Dr. Hilarie Fredrickson      Reason for Consultation:  Acute on Chronic Anemia            HPI:   Patrick [name] is a 66 y.o. male with a past medical history significant for ESRD on HD, sickle cell disease, CHF, A-fib and recent GI bleed, who returns to the ER on 07/31/2021 with shortness of breath on exertion and fatigue.    Today, the patient explains that over the past week he has had increasing shortness of breath and weakness and fatigue noting that he can hardly do anything.  He last went to hemodialysis on Thursday, 07/27/2021 and felt too weak to go again on Saturday.  Describes that he  has had some vomiting telling me that food will just "come back up", this is almost every time he has eaten over the past 2 to 3 days.  Sometimes it will be down there for 10 to 15 minutes and other times it does not feel like it makes it very far.  Tells me he has had regular brown bowel movements and no real abdominal  pain, though does describe some low back pain when standing for long periods of time.  Has continued on his Pantoprazole 40 twice daily since discharge.    Denies fever, chills or weight loss.  ER course: Hemoglobin 5.2--> 2 units PRBCs ordered  GI History: 07/19/2021 office visit-follow-up after recent hospital admission for acute on chronic anemia found to have hemorrhagic gastritis, at that time was doing well.  He had been taking his twice daily PPI with no evidence of recurrent bleeding, also discussed recent choledocholithiasis status post ERCP with stone removal/enterotomy/CBD and PD stent placement in [place] with stents removed during EGD while hospitalized in [facility] December 2022, also discussed recent small bowel obstruction for SBO and open cholecystectomy (see that office visit note for further details) 06/28/2021 EGD with hemorrhagic gastritis-Eliquis stopped at that time  Past Medical History:  Diagnosis Date   Blood dyscrasia    Blood transfusion    "I've had a bunch of them"   CHF (congestive heart failure) ([place])    followed by hf clinic   Chronic kidney disease (CKD), stage III (moderate) ([place])    Dialysis patient ([place]) 04/2019   AV fistula (right arm)   Gout    Headache(784.0)    "probably weekly" (01/13/2014)   Legally blind    Shortness of breath dyspnea    Sickle cell disease, type SS ([place])    Swelling abdomen    Swelling of extremity, left    Swelling of extremity, right     Past Surgical History:  Procedure Laterality Date   A/V FISTULAGRAM Right 07/29/2020   Procedure: A/V FISTULAGRAM - Right Arm;  Surgeon: Angelia Mould, MD;  Location: [place] CV [facility];  Service: Cardiovascular;  Laterality: Right;   BASCILIC VEIN TRANSPOSITION Right 04/12/2014   Procedure: BASILIC VEIN TRANSPOSITION;  Surgeon: Elam Dutch, MD;  Location: [place];  Service: Vascular;  Laterality: Right;   BIOPSY  08/04/2020   Procedure: BIOPSY;  Surgeon: Ladene Artist,  MD;  Location: [facility] ENDOSCOPY;  Service: Gastroenterology;;   BIOPSY  06/28/2021   Procedure: BIOPSY;  Surgeon: Jerene Bears, MD;  Location: [facility] ENDOSCOPY;  Service: Gastroenterology;;   BOWEL RESECTION N/A 05/24/2021   Procedure: SMALL BOWEL RESECTION;  Surgeon: Rolm Bookbinder, MD;  Location: [place];  Service: General;  Laterality: N/A;   CARDIAC CATHETERIZATION  05/2011   CARDIOVERSION N/A 06/05/2018   Procedure: CARDIOVERSION;  Surgeon: Jolaine Artist, MD;  Location: [place];  Service: Cardiovascular;  Laterality: N/A;   CARDIOVERSION N/A 10/06/2019   Procedure: CARDIOVERSION;  Surgeon: Jolaine Artist, MD;  Location: [facility] ENDOSCOPY;  Service: Cardiovascular;  Laterality: N/A;   CHOLECYSTECTOMY N/A 05/24/2021   Procedure: OPEN CHOLECYSTECTOMY;  Surgeon: Rolm Bookbinder, MD;  Location: [place];  Service: General;  Laterality: N/A;   COLONOSCOPY WITH PROPOFOL N/A 09/24/2017   Procedure: COLONOSCOPY WITH PROPOFOL;  Surgeon: Jackquline Denmark, MD;  Location: [place];  Service: Endoscopy;  Laterality: N/A;   ESOPHAGOGASTRODUODENOSCOPY N/A 09/19/2017   Procedure: ESOPHAGOGASTRODUODENOSCOPY (EGD);  Surgeon: Jackquline Denmark, MD;  Location: [facility] ENDOSCOPY;  Service: Endoscopy;  Laterality: N/A;  ESOPHAGOGASTRODUODENOSCOPY N/A 05/29/2021   Procedure: ESOPHAGOGASTRODUODENOSCOPY (EGD);  Surgeon: Milus Banister, MD;  Location: [facility] ENDOSCOPY;  Service: Gastroenterology;  Laterality: N/A;   ESOPHAGOGASTRODUODENOSCOPY (EGD) WITH PROPOFOL N/A 08/04/2020   Procedure: ESOPHAGOGASTRODUODENOSCOPY (EGD) WITH PROPOFOL;  Surgeon: Ladene Artist, MD;  Location: [employer];  Service: Gastroenterology;  Laterality: N/A;   ESOPHAGOGASTRODUODENOSCOPY (EGD) WITH PROPOFOL N/A 06/28/2021   Procedure: ESOPHAGOGASTRODUODENOSCOPY (EGD) WITH PROPOFOL;  Surgeon: Jerene Bears, MD;  Location: [facility] ENDOSCOPY;  Service: Gastroenterology;  Laterality: N/A;   HEMOSTASIS CLIP PLACEMENT  05/29/2021   Procedure: HEMOSTASIS CLIP  PLACEMENT;  Surgeon: Milus Banister, MD;  Location: [facility] ENDOSCOPY;  Service: Gastroenterology;;   HOT HEMOSTASIS N/A 05/29/2021   Procedure: HOT HEMOSTASIS (ARGON PLASMA COAGULATION/BICAP);  Surgeon: Milus Banister, MD;  Location: [facility] ENDOSCOPY;  Service: Gastroenterology;  Laterality: N/A;   HOT HEMOSTASIS N/A 06/28/2021   Procedure: HOT HEMOSTASIS (ARGON PLASMA COAGULATION/BICAP);  Surgeon: Jerene Bears, MD;  Location: [facility] ENDOSCOPY;  Service: Gastroenterology;  Laterality: N/A;   IR PERC TUN PERIT CATH WO PORT S&I /IMAG  05/23/2021   IR REMOVAL TUN CV CATH W/O FL  06/10/2021   IR US GUIDE VASC ACCESS RIGHT  05/23/2021   LAPAROSCOPY N/A 05/24/2021   Procedure: LAPAROSCOPY DIAGNOSTIC;  Surgeon: Rolm Bookbinder, MD;  Location: [place];  Service: General;  Laterality: N/A;   LAPAROTOMY N/A 05/24/2021   Procedure: EXPLORATORY LAPAROTOMY;  Surgeon: Rolm Bookbinder, MD;  Location: [place];  Service: General;  Laterality: N/A;   LEFT AND RIGHT HEART CATHETERIZATION WITH CORONARY ANGIOGRAM N/A 06/11/2011   Procedure: LEFT AND RIGHT HEART CATHETERIZATION WITH CORONARY ANGIOGRAM;  Surgeon: Larey Dresser, MD;  Location[facility] CATH LAB;  Service: Cardiovascular;  Laterality: N/A;   PERIPHERAL VASCULAR BALLOON ANGIOPLASTY Right 07/29/2020   Procedure: PERIPHERAL VASCULAR BALLOON ANGIOPLASTY;  Surgeon: Angelia Mould, MD;  Location: [place] CV LAB;  Service: Cardiovascular;  Laterality: Right;  arm fistula   REVISON OF ARTERIOVENOUS FISTULA Right 08/08/2020   Procedure: ANEURYSM EXCISION OF RIGHT UPPER EXTREMITY ARTERIOVENOUS FISTULA;  Surgeon: Angelia Mould, MD;  [facility] OR;  Service: Vascular;  Laterality: Right;   SCLEROTHERAPY  05/29/2021   Procedure: Clide Deutscher;  Surgeon: Milus Banister, MD;  Location: [facility] ENDOSCOPY;  Service: Gastroenterology;;   Lavell Islam REMOVAL  05/29/2021   Procedure: STENT REMOVAL;  Surgeon: Milus Banister, MD;  Location: [facility] ENDOSCOPY;  Service:  Gastroenterology;;   TEE WITHOUT CARDIOVERSION N/A 12/27/2015   Procedure: TRANSESOPHAGEAL ECHOCARDIOGRAM (TEE);  Surgeon: Sueanne Margarita, MD;  Location: [facility] ENDOSCOPY;  Service: Cardiovascular;  Laterality: N/A;    Family History  Problem Relation Age of Onset   Alcohol abuse Mother    Liver disease Mother    Cirrhosis Mother    Heart attack Mother     Social History   Tobacco Use   Smoking status: Former    Packs/day: 0.50    Years: 41.00    Pack years: 20.50    Types: Cigarettes    Quit date: 12/08/2015    Years since quitting: 5.6   Smokeless tobacco: Never  Vaping Use   Vaping Use: Never used  Substance Use Topics   Alcohol use: Not Currently    Alcohol/week: 3.0 standard drinks    Types: 1 Glasses of wine, 2 Cans of beer per week    Comment: occasional   Drug use: No    Prior to Admission medications   Medication Sig Start Date End Date Taking? Authorizing Provider  Acetaminophen (TYLENOL 8 HOUR PO) Patient takes two 500 mg tablets as needed for pain.   Yes [provider]  albuterol (VENTOLIN HFA) 108 (90 Base) MCG/ACT inhaler Inhale 2 puffs into the lungs every 6 (six) hours as needed for wheezing or shortness of breath. 05/09/20  Yes Freddi Starr, MD  allopurinol (ZYLOPRIM) 100 MG tablet TAKE 1 TABLET BY MOUTH EVERY DAY Patient taking differently: Take 100 mg by mouth daily. 07/13/21  Yes Dorena Dew, FNP  AURYXIA 1 GM 210 MG(Fe) tablet Take 420 mg by mouth 3 (three) times daily. 02/11/21  Yes [provider]  folic acid (FOLVITE) 1 MG tablet TAKE 1 TABLET BY MOUTH EVERY DAY Patient taking differently: Take 1 mg by mouth daily. 02/22/21  Yes Dorena Dew, FNP  Glycerin-Hypromellose-PEG 400 (VISINE DRY EYE OP) Place 1 drop into both eyes as needed (dryness).   Yes [provider]  hydroxyurea (HYDREA) 500 MG capsule Take 1 capsule (500 mg total) by mouth daily. TAKE 1 CAPSULE BY MOUTH EVERY DAY WITH FOOD TO MINIMIZE GI SIDE  EFFECTS Patient taking differently: Take 500 mg by mouth daily before breakfast. 01/31/21  Yes Dorena Dew, FNP  lidocaine-prilocaine (EMLA) cream Apply 1 application topically daily as needed (port access).  09/08/19  Yes [provider]  oxycodone (OXY-IR) 5 MG capsule Take 2 capsules (10 mg total) by mouth every 6 (six) hours as needed for pain. 07/31/21  Yes Dorena Dew, FNP  pantoprazole (PROTONIX) 40 MG tablet TAKE 1 TABLET BY MOUTH TWICE A DAY Patient taking differently: Take 40 mg by mouth 2 (two) times daily. 07/26/21  Yes Willia Craze, NP  tiZANidine (ZANAFLEX) 4 MG tablet Take 0.5 tablets (2 mg total) by mouth every 8 (eight) hours as needed for muscle spasms. 07/18/21  Yes Dorena Dew, FNP  torsemide (DEMADEX) 20 MG tablet Take 20 mg by mouth in the morning, at noon, in the evening, and at bedtime.   Yes [provider]  Vitamin D, Ergocalciferol, (DRISDOL) 1.25 MG (50000 UNIT) CAPS capsule TAKE 1 CAPSULE BY MOUTH ONE TIME PER WEEK Patient taking differently: Take 50,000 Units by mouth once a week. Monday 05/15/21  Yes Dorena Dew, FNP  carvedilol (COREG) 3.125 MG tablet Take 1 tablet (3.125 mg total) by mouth 2 (two) times daily. Patient not taking: Reported on 07/31/2021 07/17/21 10/15/21  Rafael Bihari, FNP  gabapentin (NEURONTIN) 100 MG capsule TAKE 1 CAPSULE BY MOUTH EVERYDAY AT BEDTIME Patient not taking: Reported on 07/31/2021 12/12/20   Dorena Dew, FNP    Current Facility-Administered Medications  Medication Dose Route Frequency Provider Last Rate Last Admin   0.9 %  sodium chloride infusion  10 mL/hr Intravenous Once Sherrell Puller, PA-C       0.9 %  sodium chloride infusion  250 mL Intravenous PRN Chotiner, Yevonne Aline, MD       0.9 %  sodium chloride infusion  100 mL Intravenous PRN Madelon Lips, MD       0.9 %  sodium chloride infusion  100 mL Intravenous PRN Madelon Lips, MD       acetaminophen (TYLENOL) tablet 650 mg   650 mg Oral Q6H PRN Chotiner, Yevonne Aline, MD       Or   acetaminophen (TYLENOL) suppository 650 mg  650 mg Rectal Q6H PRN Chotiner, Yevonne Aline, MD       allopurinol (ZYLOPRIM) tablet 100 mg  100 mg Oral Daily Chotiner, Leory Plowman  S, MD   100 mg at 08/01/21 0901   alteplase (CATHFLO ACTIVASE) injection 2 mg  2 mg Intracatheter Once PRN Madelon Lips, MD       Chlorhexidine Gluconate Cloth 2 % PADS 6 each  6 each Topical Q0600 Madelon Lips, MD   6 each at 08/01/21 0353   ferric citrate (AURYXIA) tablet 420 mg  420 mg Oral TID with meals Chotiner, Yevonne Aline, MD   420 mg at 38/75/64 3329   folic acid (FOLVITE) tablet 1 mg  1 mg Oral Daily Chotiner, Yevonne Aline, MD   1 mg at 08/01/21 0901   heparin injection 1,000 Units  1,000 Units Dialysis PRN Madelon Lips, MD       HYDROmorphone (DILAUDID) injection 1 mg  1 mg Intravenous Q3H PRN Chotiner, Yevonne Aline, MD   1 mg at 08/01/21 0301   hydroxyurea (HYDREA) capsule 500 mg  500 mg Oral QAC breakfast Chotiner, Yevonne Aline, MD       lidocaine (PF) (XYLOCAINE) 1 % injection 5 mL  5 mL Intradermal PRN Madelon Lips, MD       lidocaine-prilocaine (EMLA) cream 1 application  1 application Topical PRN Madelon Lips, MD       ondansetron [facility]) tablet 4 mg  4 mg Oral Q6H PRN Chotiner, Yevonne Aline, MD       Or   ondansetron [facility]) injection 4 mg  4 mg Intravenous Q6H PRN Chotiner, Yevonne Aline, MD   4 mg at 08/01/21 5188   oxyCODONE (Oxy IR/ROXICODONE) immediate release tablet 10 mg  10 mg Oral Q6H PRN Chotiner, Yevonne Aline, MD       pantoprazole (PROTONIX) EC tablet 40 mg  40 mg Oral BID Chotiner, Yevonne Aline, MD   40 mg at 08/01/21 0901   pentafluoroprop-tetrafluoroeth (GEBAUERS) aerosol 1 application  1 application Topical PRN Madelon Lips, MD       senna-docusate (Senokot-S) tablet 1 tablet  1 tablet Oral QHS PRN Chotiner, Yevonne Aline, MD       sodium chloride flush (NS) 0.9 % injection 3 mL  3 mL Intravenous Q12H Chotiner, Yevonne Aline, MD   3 mL at 08/01/21  0901   sodium chloride flush (NS) 0.9 % injection 3 mL  3 mL Intravenous PRN Chotiner, Yevonne Aline, MD        Allergies as of 07/31/2021   (No Known Allergies)     Review of Systems:    Constitutional: No weight loss, fever or chills Skin: No rash Cardiovascular: No chest pain Respiratory: No SOB Gastrointestinal: See HPI and otherwise negative Genitourinary: No dysuria  Neurological: No headache, dizziness or syncope Musculoskeletal: No new muscle or joint pain Hematologic: No bleeding or bruising Psychiatric: No history of depression or anxiety    Physical Exam:  Vital signs in last 24 hours: Temp:  [96 F (35.6 C)-97.8 F (36.6 C)] 97.6 F (36.4 C) (02/14 0841) Pulse Rate:  [42-134] 77 (02/14 0841) Resp:  [9-23] 14 (02/14 0841) BP: (80-107)/(51-76) 92/59 (02/14 0841) SpO2:  [98 %-100 %] 99 % (02/14 0841) Weight:  [58.7 kg] 58.7 kg (02/14 0544) Last BM Date : 07/30/21 General:   Pleasant AA male appears to be in NAD, Well developed, Well nourished, alert and cooperative Head:  Normocephalic and atraumatic. Eyes:   PEERL, EOMI. No icterus. Conjunctiva pink. Ears:  Normal auditory acuity. Neck:  Supple Throat: Oral cavity and pharynx without inflammation, swelling or lesion.  Lungs: Respirations even and unlabored. Lungs clear to auscultation bilaterally.  No wheezes, crackles, or rhonchi.  Heart: Normal S1, S2. No MRG. Regular rate and rhythm. No peripheral edema, cyanosis or pallor.  Abdomen:  Soft, nondistended, nontender. No rebound or guarding. Increased bowel sounds all four quadrants. No appreciable masses or hepatomegaly. Rectal:  Not performed.  Msk:  Symmetrical without gross deformities. Peripheral pulses intact.  Extremities:  Without edema, no deformity or joint abnormality.  Neurologic:  Alert and  oriented x4;  grossly normal neurologically.  Skin:   Dry and intact without significant lesions or rashes. Psychiatric: Demonstrates good judgement and reason  without abnormal affect or behaviors.  LAB RESULTS: Recent Labs    07/31/21 1611 07/31/21 1715 08/01/21 0645 08/01/21 0758  WBC 8.9  --  10.5 11.6*  HGB 5.3* 5.8* 7.0* 7.6*  HCT 14.9* 17.0* 19.2* 20.7*  PLT 227  --  218 223   BMET Recent Labs    07/31/21 1611 07/31/21 1715 08/01/21 0645 08/01/21 0758  NA 129* 128* 130* 128*  K 4.2 4.0 4.3 4.9  CL 90* 90* 89* 90*  CO2 24  --  25 23  GLUCOSE 84 84 75 72  BUN 67* 62* 72* 75*  CREATININE 6.16* 7.80* 6.37* 6.26*  CALCIUM 8.7*  --  8.7* 8.7*   LFT Recent Labs    07/31/21 1611 08/01/21 0758  PROT 6.5  --   ALBUMIN <1.5* <1.5*  AST 14*  --   ALT 10  --   ALKPHOS 237*  --   BILITOT 1.4*  --      Impression / Plan:   Impression: 1.  Symptomatic acute on chronic iron deficiency anemia: Interestingly fecal occult negative, hemoglobin 5.3 at admission--> 2 units PRBCs--> 7.6 (06/29/2021 7.5), patient has not seen any black looking stool, the only new GI symptom is some vomiting of food over the past 2 days, continued on Pantoprazole 40 twice daily last EGD in January with hemorrhagic gastritis treated with APC; likely known hemorrhagic gastritis 2.  ESRD on HD 3.  Chronic systolic CHF: Last echo December 2022 with EF 40-45% 4.  A-fib: Rate controlled, Eliquis stopped at last admission due to GI bleed  Plan: 1.  Patient scheduled for an EGD tomorrow with Dr. Bryan Lemma.  Did discuss risks, benefits, limitations and alternatives and the patient agrees to proceed. 2.  Patient can be on heart healthy diet today as tolerated and NPO at midnight 3.  Continue Pantoprazole 40mg  PO BID 4.  Continue to monitor hemoglobin with transfusion as needed less than 7  Thank you for your kind consultation, we will continue to follow.  Lavone Nian [facility]  08/01/2021, 10:23 AM

## 2021-08-02 ENCOUNTER — Inpatient Hospital Stay (HOSPITAL_COMMUNITY): Payer: HMO | Admitting: Anesthesiology

## 2021-08-02 ENCOUNTER — Encounter (HOSPITAL_COMMUNITY): Payer: Self-pay | Admitting: Family Medicine

## 2021-08-02 ENCOUNTER — Encounter (HOSPITAL_COMMUNITY)
Admission: EM | Disposition: A | Discharge status: Home Health | Payer: Self-pay | Source: Home / Self Care | Attending: Internal Medicine

## 2021-08-02 DIAGNOSIS — K31819 Angiodysplasia of stomach and duodenum without bleeding: Secondary | ICD-10-CM

## 2021-08-02 DIAGNOSIS — D62 Acute posthemorrhagic anemia: Secondary | ICD-10-CM | POA: Diagnosis not present

## 2021-08-02 DIAGNOSIS — K222 Esophageal obstruction: Secondary | ICD-10-CM

## 2021-08-02 DIAGNOSIS — K31811 Angiodysplasia of stomach and duodenum with bleeding: Secondary | ICD-10-CM

## 2021-08-02 DIAGNOSIS — I5022 Chronic systolic (congestive) heart failure: Secondary | ICD-10-CM | POA: Diagnosis not present

## 2021-08-02 DIAGNOSIS — K449 Diaphragmatic hernia without obstruction or gangrene: Secondary | ICD-10-CM

## 2021-08-02 DIAGNOSIS — I10 Essential (primary) hypertension: Secondary | ICD-10-CM | POA: Diagnosis not present

## 2021-08-02 DIAGNOSIS — N186 End stage renal disease: Secondary | ICD-10-CM | POA: Diagnosis not present

## 2021-08-02 HISTORY — PX: HOT HEMOSTASIS: SHX5433

## 2021-08-02 HISTORY — PX: ESOPHAGOGASTRODUODENOSCOPY (EGD) WITH PROPOFOL: SHX5813

## 2021-08-02 LAB — BASIC METABOLIC PANEL
Anion gap: 15 (ref 5–15)
BUN: 28 mg/dL — ABNORMAL HIGH (ref 8–23)
CO2: 23 mmol/L (ref 22–32)
Calcium: 8.5 mg/dL — ABNORMAL LOW (ref 8.9–10.3)
Chloride: 98 mmol/L (ref 98–111)
Creatinine, Ser: 2.93 mg/dL — ABNORMAL HIGH (ref 0.61–1.24)
GFR, Estimated: 23 mL/min — ABNORMAL LOW (ref >60)
Glucose, Bld: 65 mg/dL — ABNORMAL LOW (ref 70–99)
Potassium: 4.1 mmol/L (ref 3.5–5.1)
Sodium: 136 mmol/L (ref 135–145)

## 2021-08-02 LAB — TYPE AND SCREEN
ABO/RH(D): AB POS
Antibody Screen: NEGATIVE
Unit division: 0
Unit division: 0
Unit division: 0

## 2021-08-02 LAB — HEMOGLOBIN AND HEMATOCRIT, BLOOD
HCT: 23.4 % — ABNORMAL LOW (ref 39.0–52.0)
HCT: 24.3 % — ABNORMAL LOW (ref 39.0–52.0)
Hemoglobin: 8.2 g/dL — ABNORMAL LOW (ref 13.0–17.0)
Hemoglobin: 8.6 g/dL — ABNORMAL LOW (ref 13.0–17.0)

## 2021-08-02 LAB — BPAM RBC
Blood Product Expiration Date: 202302212359
Blood Product Expiration Date: 202302272359
Blood Product Expiration Date: 202302272359
ISSUE DATE / TIME: 202302131816
ISSUE DATE / TIME: 202302132258
ISSUE DATE / TIME: 202302141719
Unit Type and Rh: 1700
Unit Type and Rh: 9500
Unit Type and Rh: 9500

## 2021-08-02 LAB — HEPATITIS B SURFACE ANTIBODY, QUANTITATIVE: Hep B S AB Quant (Post): 424.5 m[IU]/mL (ref >9.9)

## 2021-08-02 SURGERY — ESOPHAGOGASTRODUODENOSCOPY (EGD) WITH PROPOFOL
Anesthesia: Monitor Anesthesia Care

## 2021-08-02 MED ORDER — HYDROMORPHONE HCL 1 MG/ML IJ SOLN
0.5000 mg | INTRAMUSCULAR | Status: DC | PRN
  Administered 2021-08-02 – 2021-08-06 (×15): 0.5 mg via INTRAVENOUS
  Filled 2021-08-02 (×15): qty 0.5

## 2021-08-02 MED ORDER — SODIUM CHLORIDE 0.9 % IV SOLN: INTRAVENOUS | Status: DC

## 2021-08-02 MED ORDER — CHLORHEXIDINE GLUCONATE CLOTH 2 % EX PADS
6.0000 | MEDICATED_PAD | Freq: Every day | CUTANEOUS | Status: DC
  Administered 2021-08-04: 6 via TOPICAL

## 2021-08-02 MED ORDER — PHENYLEPHRINE 40 MCG/ML (10ML) SYRINGE FOR IV PUSH (FOR BLOOD PRESSURE SUPPORT)
PREFILLED_SYRINGE | INTRAVENOUS | Status: DC | PRN
  Administered 2021-08-02: 80 ug via INTRAVENOUS

## 2021-08-02 MED ORDER — ONDANSETRON HCL 4 MG/2ML IJ SOLN
INTRAMUSCULAR | Status: DC | PRN
  Administered 2021-08-02: 4 mg via INTRAVENOUS

## 2021-08-02 MED ORDER — SODIUM CHLORIDE (PF) 0.9 % IJ SOLN
INTRAMUSCULAR | Status: AC
  Administered 2021-08-02: 3 mL via INTRAVENOUS
  Filled 2021-08-02: qty 10

## 2021-08-02 MED ORDER — PROPOFOL 500 MG/50ML IV EMUL
INTRAVENOUS | Status: DC | PRN
  Administered 2021-08-02: 100 ug/kg/min via INTRAVENOUS

## 2021-08-02 MED ORDER — SODIUM CHLORIDE 0.9 % IV SOLN
12.5000 mg | Freq: Four times a day (QID) | INTRAVENOUS | Status: DC | PRN
  Administered 2021-08-02: 12.5 mg via INTRAVENOUS
  Filled 2021-08-02 (×4): qty 0.5

## 2021-08-02 SURGICAL SUPPLY — 15 items

## 2021-08-02 NOTE — Progress Notes (Signed)
[facility] Progress Note   Subjective:   Patient seen and examined at bedside.  Reports feeling a little less weak and fatigued today.  Continues to have abdominal pain and nausea. Denies CP, SOB, diarrhea and vomiting.   Objective Vitals:   08/01/21 2025 08/01/21 2348 08/02/21 0525 08/02/21 0809  BP: 101/64 104/60 106/74 102/63  Pulse: 80 77 90 74  Resp: 18 16 14 13   Temp: 98.8 F (37.1 C) 97.6 F (36.4 C) 97.9 F (36.6 C) 98 F (36.7 C)  TempSrc: Oral Oral Oral Oral  SpO2: 98% 98% 98% 100%  Weight:      Height:       Physical Exam General:chronically ill appearing male in NAD Heart:IRIR Lungs:CTAB, nml WOB on RA Abdomen:soft, NTND Extremities:+trace pedal edema, no other edema Dialysis Access:  RU AVF +b/t  Filed Weights   08/01/21 0544 08/01/21 1040  Weight: 58.7 kg 59 kg    Intake/Output Summary (Last 24 hours) at 08/02/2021 1128 Last data filed at 08/02/2021 0300 Gross per 24 hour  Intake 500 ml  Output 0 ml  Net 500 ml    Additional Objective Labs: Basic Metabolic Panel: Recent Labs  Lab 08/01/21 0645 08/01/21 0758 08/02/21 0143  NA 130* 128* 136  K 4.3 4.9 4.1  CL 89* 90* 98  CO2 25 23 23   GLUCOSE 75 72 65*  BUN 72* 75* 28*  CREATININE 6.37* 6.26* 2.93*  CALCIUM 8.7* 8.7* 8.5*  PHOS  --  4.9*  --    Liver Function Tests: Recent Labs  Lab 07/31/21 1611 08/01/21 0758  AST 14*  --   ALT 10  --   ALKPHOS 237*  --   BILITOT 1.4*  --   PROT 6.5  --   ALBUMIN <1.5* <1.5*   CBC: Recent Labs  Lab 07/31/21 1611 07/31/21 1715 08/01/21 0645 08/01/21 0758 08/01/21 1503 08/01/21 2338 08/02/21 0143  WBC 8.9  --  10.5 11.6*  --   --   --   HGB 5.3*   < > 7.0* 7.6* 6.6* 8.6* 8.2*  HCT 14.9*   < > 19.2* 20.7* 18.5* 24.3* 23.4*  MCV 100.7*  --  92.8 91.6  --   --   --   PLT 227  --  218 223  --   --   --    < > = values in this interval not displayed.    Medications:  sodium chloride     sodium chloride     sodium  chloride     sodium chloride     promethazine (PHENERGAN) injection (IM or IVPB) Stopped (08/02/21 0135)    (feeding supplement) PROSource Plus  30 mL Oral BID BM   allopurinol  100 mg Oral Daily   Chlorhexidine Gluconate Cloth  6 each Topical Q0600   ferric citrate  420 mg Oral TID with meals   folic acid  1 mg Oral Daily   hydroxyurea  500 mg Oral QAC breakfast   pantoprazole (PROTONIX) IV  40 mg Intravenous Q12H   sodium chloride flush  3 mL Intravenous Q12H    Dialysis Orders: TTS - AF  4hrs, BFR 400, DFR 500,  EDW 55.5kg, 3K/ 2Ca UFP2   Access: RU AVF  Heparin none Mircera 225 mcg q2wks - last 07/20/21 Hectorol 59mcg IV qHD   Sensipar 30mg  qHD   Assessment/Plan:  Symptomatic acute on chronic anemia of CKD - Hx recurrent GIB. EGD planned for today.  Hgb 5.2 on  admit s/p 2 unit pRBC improved to 7.6, then drop to 6.6 post HD.  Given 1 more pRBC now improved to 8.4.   On PPI BID.     ESRD -  on TTS.  Continue regular schedule.  Hyponatremia -  improved.  Hypotension/volume  - Avoiding midodrine with prolonged QT. Does not appear volume overloaded.  Will try to get standing weights if able. UF limited by hypotension yesterday.  Will try to pull fluid tomorrow as BP allows.   Secondary Hyperparathyroidism -  Calcium and phos in goal.  Continue sensipar, binders and hectorol.   Severe protein calorie malnutrition - Currently NPO. renal diet w/fluid restrictions when advanced.  Alb <1.5, give protein supplements.  A fib - not on eliquis d/t GIB Recent SBO s/p surgery - per PMD Sickle cell anemia - chronic  Jen Mow, PA-C [facility] 08/02/2021,11:28 AM  LOS: 2 days

## 2021-08-02 NOTE — Progress Notes (Signed)
PROGRESS NOTE        PATIENT DETAILS Name: Patrick [name] Age: 66 y.o. Sex: male Date of Birth: Oct 26, 1955 Admit Date: 07/31/2021 Admitting Physician Eben Burow, MD [id number], Asencion Partridge, FNP  Brief Summary: 66 year old male with history of ESRD on HD, sickle cell disease, permanent atrial fibrillation, history of UGI due to AVMs-referred to the ED from HD center for hemoglobin of 5.8.  EGD on 2/15 showed bleeding AVMs.  Significant events: 2/13>> referred to the ED from HD center for evaluation of hemoglobin of 5.8.    Significant imaging studies:  Significant microbiology data: 2/13>> COVID/influenza PCR: Negative  Procedures: 2/15>> EGD: Multiple bleeding angiectasia's in the stomach treated with argon plasma coagulation.  Consults: GI, nephrology  Subjective: Lying comfortably in bed-denies any chest pain or shortness of breath.  Objective: Vitals: Blood pressure (!) 90/59, pulse 84, temperature 98.4 F (36.9 C), resp. rate 16, height 5\' 11"  (1.803 m), weight 59 kg, SpO2 97 %.   Exam: Gen Exam:Alert awake-not in any distress HEENT:atraumatic, normocephalic Chest: B/L clear to auscultation anteriorly CVS:S1S2 regular Abdomen:soft non tender, non distended Extremities:no edema Neurology: Non focal Skin: no rash  Pertinent Labs/Radiology: CBC Latest Ref Rng & Units 08/02/2021 08/01/2021 08/01/2021  WBC 4.0 - 10.5 K/uL - - -  Hemoglobin 13.0 - 17.0 g/dL 8.2(L) 8.6(L) 6.6(LL)  Hematocrit 39.0 - 52.0 % 23.4(L) 24.3(L) 18.5(L)  Platelets 150 - 400 K/uL - - -    Lab Results  Component Value Date   NA 136 08/02/2021   K 4.1 08/02/2021   CL 98 08/02/2021   CO2 23 08/02/2021      Assessment/Plan: UGI bleeding with acute blood loss anemia: EGD on 2/15 showed bleeding AVMs-hemoglobin has stabilized after 3 units of PRBC transfusion so far.  Recommendations for GI to advance diet as tolerated, use PPI 40 twice daily for 8 weeks  and then daily indefinitely.  Continue to follow closely.  ESRD: On HD per nephrology.  Chronic HFrEF (EF 40-45% by echo December 2022): Volume status stable-volume removal with HD.  Permanent atrial fibrillation: Rate controlled with Coreg-not a candidate for anticoagulation due to recurrent GI bleeding.  HTN: BP stable-continue Coreg  Sickle cell disease: Chronic issue-no evidence of crisis.  Continue Hydrea.  BMI: Estimated body mass index is 18.14 kg/m as calculated from the following:   Height as of this encounter: 5\' 11"  (1.803 m).   Weight as of this encounter: 59 kg.   Code status:   Code Status: Full Code   DVT Prophylaxis: SCDs Start: 07/31/21 2024   Family Communication: None at bedside  Disposition Plan: Status is: Inpatient Remains inpatient appropriate because: Upper GI bleeding-s/p EGD with APC of bleeding AVMs-diet being advanced-continue to follow CBC to ensure stability.    Planned Discharge Destination:Home   Diet: Diet Order             Diet NPO time specified  Diet effective now                     Antimicrobial agents: Anti-infectives (From admission, onward)    None        MEDICATIONS: Scheduled Meds:  [MAR Hold] (feeding supplement) PROSource Plus  30 mL Oral BID BM   [MAR Hold] allopurinol  100 mg Oral Daily   [MAR Hold] Chlorhexidine Gluconate Cloth  6  each Topical Q0600   [MAR Hold] Chlorhexidine Gluconate Cloth  6 each Topical Q0600   [MAR Hold] ferric citrate  420 mg Oral TID with meals   [MAR Hold] folic acid  1 mg Oral Daily   [MAR Hold] hydroxyurea  500 mg Oral QAC breakfast   [MAR Hold] pantoprazole (PROTONIX) IV  40 mg Intravenous Q12H   [MAR Hold] sodium chloride flush  3 mL Intravenous Q12H   Continuous Infusions:  [MAR Hold] sodium chloride     [MAR Hold] sodium chloride     [MAR Hold] sodium chloride     [MAR Hold] sodium chloride 100 mL (08/02/21 1201)   sodium chloride     [MAR Hold] promethazine  (PHENERGAN) injection (IM or IVPB) Stopped (08/02/21 0135)   PRN Meds:.[MAR Hold] sodium chloride, [MAR Hold] sodium chloride, [MAR Hold] sodium chloride, [MAR Hold] acetaminophen **OR** [MAR Hold] acetaminophen, [MAR Hold] heparin, [MAR Hold] lidocaine (PF), [MAR Hold] lidocaine-prilocaine, [MAR Hold] ondansetron **OR** [MAR Hold] ondansetron (ZOFRAN) IV, [MAR Hold] oxyCODONE, [MAR Hold] pentafluoroprop-tetrafluoroeth, [MAR Hold] promethazine (PHENERGAN) injection (IM or IVPB), [MAR Hold] senna-docusate, [MAR Hold] sodium chloride flush   I have personally reviewed following labs and imaging studies  LABORATORY DATA: CBC: Recent Labs  Lab 07/31/21 1611 07/31/21 1715 08/01/21 0645 08/01/21 0758 08/01/21 1503 08/01/21 2338 08/02/21 0143  WBC 8.9  --  10.5 11.6*  --   --   --   HGB 5.3*   < > 7.0* 7.6* 6.6* 8.6* 8.2*  HCT 14.9*   < > 19.2* 20.7* 18.5* 24.3* 23.4*  MCV 100.7*  --  92.8 91.6  --   --   --   PLT 227  --  218 223  --   --   --    < > = values in this interval not displayed.    Basic Metabolic Panel: Recent Labs  Lab 07/31/21 1611 07/31/21 1715 08/01/21 0645 08/01/21 0758 08/02/21 0143  NA 129* 128* 130* 128* 136  K 4.2 4.0 4.3 4.9 4.1  CL 90* 90* 89* 90* 98  CO2 24  --  25 23 23   GLUCOSE 84 84 75 72 65*  BUN 67* 62* 72* 75* 28*  CREATININE 6.16* 7.80* 6.37* 6.26* 2.93*  CALCIUM 8.7*  --  8.7* 8.7* 8.5*  MG 1.5*  --   --   --   --   PHOS  --   --   --  4.9*  --     GFR: Estimated Creatinine Clearance: 21.5 mL/min (A) (by C-G formula based on SCr of 2.93 mg/dL (H)).  Liver Function Tests: Recent Labs  Lab 07/31/21 1611 08/01/21 0758  AST 14*  --   ALT 10  --   ALKPHOS 237*  --   BILITOT 1.4*  --   PROT 6.5  --   ALBUMIN <1.5* <1.5*   No results for input(s): LIPASE, AMYLASE in the last 168 hours. No results for input(s): AMMONIA in the last 168 hours.  Coagulation Profile: No results for input(s): INR, PROTIME in the last 168 hours.  Cardiac  Enzymes: No results for input(s): CKTOTAL, CKMB, CKMBINDEX, TROPONINI in the last 168 hours.  BNP (last 3 results) No results for input(s): PROBNP in the last 8760 hours.  Lipid Profile: No results for input(s): CHOL, HDL, LDLCALC, TRIG, CHOLHDL, LDLDIRECT in the last 72 hours.  Thyroid Function Tests: No results for input(s): TSH, T4TOTAL, FREET4, T3FREE, THYROIDAB in the last 72 hours.  Anemia Panel: No results for  input(s): VITAMINB12, FOLATE, FERRITIN, TIBC, IRON, RETICCTPCT in the last 72 hours.  Urine analysis:    Component Value Date/Time   COLORURINE STRAW (A) 06/02/2018 1900   APPEARANCEUR CLEAR 06/02/2018 1900   APPEARANCEUR Clear 05/31/2017 0902   LABSPEC 1.006 06/02/2018 1900   PHURINE 7.0 06/02/2018 1900   GLUCOSEU NEGATIVE 06/02/2018 1900   HGBUR SMALL (A) 06/02/2018 1900   BILIRUBINUR neg 01/26/2020 1124   BILIRUBINUR Negative 05/31/2017 0902   KETONESUR negative 09/08/2018 1621   KETONESUR NEGATIVE 06/02/2018 1900   PROTEINUR Positive (A) 01/26/2020 1124   PROTEINUR 30 (A) 06/02/2018 1900   UROBILINOGEN 1.0 01/26/2020 1124   UROBILINOGEN 0.2 09/14/2013 1354   NITRITE neg 01/26/2020 1124   NITRITE NEGATIVE 06/02/2018 1900   LEUKOCYTESUR Negative 01/26/2020 1124   LEUKOCYTESUR Negative 05/31/2017 0902    Sepsis Labs: Lactic Acid, Venous    Component Value Date/Time   LATICACIDVEN 1.3 06/27/2021 0830    MICROBIOLOGY: Recent Results (from the past 240 hour(s))  Resp Panel by RT-PCR (Flu A&B, Covid) Nasopharyngeal Swab     Status: None   Collection Time: 07/31/21 12:14 AM   Specimen: Nasopharyngeal Swab; Nasopharyngeal(NP) swabs in vial transport medium  Result Value Ref Range Status   SARS Coronavirus 2 by RT PCR NEGATIVE NEGATIVE Final    Comment: (NOTE) SARS-CoV-2 target nucleic acids are NOT DETECTED.  The SARS-CoV-2 RNA is generally detectable in upper respiratory specimens during the acute phase of infection. The lowest concentration of  SARS-CoV-2 viral copies this assay can detect is 138 copies/mL. A negative result does not preclude SARS-Cov-2 infection and should not be used as the sole basis for treatment or other patient management decisions. A negative result may occur with  improper specimen collection/handling, submission of specimen other than nasopharyngeal swab, presence of viral mutation(s) within the areas targeted by this assay, and inadequate number of viral copies(<138 copies/mL). A negative result must be combined with clinical observations, patient history, and epidemiological information. The expected result is Negative.  Fact Sheet for Patients:  EntrepreneurPulse.com.au  Fact Sheet for Healthcare Providers:  IncredibleEmployment.be  This test is no t yet approved or cleared by the [place] FDA and  has been authorized for detection and/or diagnosis of SARS-CoV-2 by FDA under an Emergency Use Authorization (EUA). This EUA will remain  in effect (meaning this test can be used) for the duration of the COVID-19 declaration under Section 564(b)(1) of the Act, 21 U.S.C.section 360bbb-3(b)(1), unless the authorization is terminated  or revoked sooner.       Influenza A by PCR NEGATIVE NEGATIVE Final   Influenza B by PCR NEGATIVE NEGATIVE Final    Comment: (NOTE) The Xpert Xpress SARS-CoV-2/FLU/RSV plus assay is intended as an aid in the diagnosis of influenza from Nasopharyngeal swab specimens and should not be used as a sole basis for treatment. Nasal washings and aspirates are unacceptable for Xpert Xpress SARS-CoV-2/FLU/RSV testing.  Fact Sheet for Patients: EntrepreneurPulse.com.au  Fact Sheet for Healthcare Providers: IncredibleEmployment.be  This test is not yet approved or cleared by the [place] FDA and has been authorized for detection and/or diagnosis of SARS-CoV-2 by FDA under an Emergency Use  Authorization (EUA). This EUA will remain in effect (meaning this test can be used) for the duration of the COVID-19 declaration under Section 564(b)(1) of the Act, 21 U.S.C. section 360bbb-3(b)(1), unless the authorization is terminated or revoked.  Performed at [facility], [place] [address], [place], [place] [address]   MRSA Next Gen  by PCR, Nasal     Status: None   Collection Time: 08/01/21  5:54 AM   Specimen: Nasal Mucosa; Nasal Swab  Result Value Ref Range Status   MRSA by PCR Next Gen NOT DETECTED NOT DETECTED Final    Comment: (NOTE) The GeneXpert MRSA Assay (FDA approved for NASAL specimens only), is one component of a comprehensive MRSA colonization surveillance program. It is not intended to diagnose MRSA infection nor to guide or monitor treatment for MRSA infections. Test performance is not FDA approved in patients less than 86 years old. Performed at [facility], [place] [address] [place], [place] [address]     RADIOLOGY STUDIES/RESULTS: No results found.   LOS: 2 days   Oren Binet, MD  [facility]  To contact the attending provider between 7A-7P or the covering provider during after hours 7P-7A, please log into the web site [email or web address] and access using universal [place] password for that web site. If you do not have the password, please call the hospital operator.  08/02/2021, 1:46 PM

## 2021-08-02 NOTE — Anesthesia Preprocedure Evaluation (Addendum)
Anesthesia Evaluation  Patient identified by MRN, date of birth, ID band Patient awake    Reviewed: Allergy & Precautions, H&P , NPO status , Patient's Chart, lab work & pertinent test results, reviewed documented beta blocker date and time   Airway Mallampati: II   Neck ROM: full    Dental  (+) Dental Advisory Given, Poor Dentition   Pulmonary shortness of breath and with exertion, COPD,  COPD inhaler, former smoker,    breath sounds clear to auscultation       Cardiovascular hypertension, Pt. on medications and Pt. on home beta blockers pulmonary hypertension (mod pHTN)+CHF (LVEF 40-45%)  + Valvular Problems/Murmurs (severe MR) MR  Rhythm:regular Rate:Normal  Echo 05/2021: 1. Left ventricular ejection fraction, by estimation, is 40 to 45%. The left ventricle has mildly decreased function. The left ventricle demonstrates global hypokinesis. The left ventricular internal cavity size was moderately dilated. There is moderate concentric left ventricular hypertrophy. Left ventricular diastolic parameters are indeterminate.  2. Right ventricular systolic function is mildly reduced. The right ventricular size is severely enlarged. There is moderately elevated pulmonary artery systolic pressure.  3. Left atrial size was severely dilated.  4. Right atrial size was severely dilated.  5. The mitral valve is grossly normal. Severe mitral valve regurgitation.  6. Mechanism appears to be secondary MR; there are multuiple jets that in summation suggestive severe regurgitation; only blunting of the pulmonary vein flow. Consider secondary imaging modality (TEE vs CMR) if procedual intervention is to be considered.  7. The tricuspid valve is abnormal. Tricuspid valve regurgitation is severe.  8. The aortic valve is tricuspid. There is mild calcification of the aortic valve. Aortic valve regurgitation is not visualized. Aortic valve sclerosis is  present, with no evidence of aortic valve stenosis.  9. The inferior vena cava is dilated in size with <50% respiratory variability, suggesting right atrial pressure of 15 mmHg.    Neuro/Psych  Headaches, negative psych ROS   GI/Hepatic Neg liver ROS, Recurrent GIB, AVMs   Endo/Other  negative endocrine ROS  Renal/GU ESRF and DialysisRenal diseaseK 4.1 this AM   negative genitourinary   Musculoskeletal negative musculoskeletal ROS (+)   Abdominal   Peds  Hematology  (+) Blood dyscrasia, Sickle cell anemia and anemia , Hb 8.2, plt 223   Anesthesia Other Findings Legally blind  Reproductive/Obstetrics negative OB ROS                           Anesthesia Physical Anesthesia Plan  ASA: 4  Anesthesia Plan: MAC   Post-op Pain Management: Minimal or no pain anticipated   Induction: Intravenous  PONV Risk Score and Plan: 2 and Treatment may vary due to age or medical condition, Ondansetron and Dexamethasone  Airway Management Planned: Simple Face Mask  Additional Equipment: None  Intra-op Plan:   Post-operative Plan:   Informed Consent: I have reviewed the patients History and Physical, chart, labs and discussed the procedure including the risks, benefits and alternatives for the proposed anesthesia with the patient or authorized representative who has indicated his/her understanding and acceptance.     Dental advisory given  Plan Discussed with: CRNA  Anesthesia Plan Comments:      Anesthesia Quick Evaluation

## 2021-08-02 NOTE — Transfer of Care (Signed)
Immediate Anesthesia Transfer of Care Note  Patient: Patrick [name]  Procedure(s) Performed: ESOPHAGOGASTRODUODENOSCOPY (EGD) WITH PROPOFOL HOT HEMOSTASIS (ARGON PLASMA COAGULATION/BICAP)  Patient Location: PACU  Anesthesia Type:MAC  Level of Consciousness: awake, alert  and oriented  Airway & Oxygen Therapy: Patient Spontanous Breathing and Patient connected to nasal cannula oxygen  Post-op Assessment: Report given to RN, Post -op Vital signs reviewed and stable and Patient moving all extremities  Post vital signs: Reviewed and stable  Last Vitals:  Vitals Value Taken Time  BP    Temp    Pulse    Resp    SpO2      Last Pain:  Vitals:   08/02/21 1156  TempSrc: Oral  PainSc: 0-No pain      Patients Stated Pain Goal: 2 (91/47/82 9562)  Complications: No notable events documented.

## 2021-08-02 NOTE — Interval H&P Note (Signed)
History and Physical Interval Note:  No acute events overnight.  Good response to 3 unit PRBC transfusion with H/H 8.2/23 this morning.  Completed HD yesterday.  08/02/2021 12:08 PM  Patrick [name]  has presented today for surgery, with the diagnosis of Anemia.  The various methods of treatment have been discussed with the patient and family. After consideration of risks, benefits and other options for treatment, the patient has consented to  Procedure(s): ESOPHAGOGASTRODUODENOSCOPY (EGD) WITH PROPOFOL (N/A) as a surgical intervention.  The patient's history has been reviewed, patient examined, no change in status, stable for surgery.  I have reviewed the patient's chart and labs.  Questions were answered to the patient's satisfaction.     Dominic Pea Leland Staszewski

## 2021-08-02 NOTE — Op Note (Signed)
[facility] Patient Name: Patrick [name] Procedure Date : 08/02/2021 MRN: [id number] Attending MD: Gerrit Heck , MD Date of Birth: 1955/07/13 CSN: [id number] Age: 66 Admit Type: Inpatient Procedure:                Upper GI endoscopy Indications:              Acute post hemorrhagic anemia superimposed on                            anemia of chronic disease, History of gastric AVMs Providers:                Gerrit Heck, MD, Kary Kos RN, RN, Trixie Deis, CRNA, Benetta Spar, Technician Referring MD:              Medicines:                Monitored Anesthesia Care Complications:            No immediate complications. Estimated Blood Loss:     Estimated blood loss was minimal. Procedure:                Pre-Anesthesia Assessment:                           - Prior to the procedure, a History and Physical                            was performed, and patient medications and                            allergies were reviewed. The patient's tolerance of                            previous anesthesia was also reviewed. The risks                            and benefits of the procedure and the sedation                            options and risks were discussed with the patient.                            All questions were answered, and informed consent                            was obtained. Prior Anticoagulants: The patient has                            taken no previous anticoagulant or antiplatelet                            agents. ASA Grade Assessment: IV - A patient with  severe systemic disease that is a constant threat                            to life. After reviewing the risks and benefits,                            the patient was deemed in satisfactory condition to                            undergo the procedure.                           After obtaining informed consent, the endoscope was                             passed under direct vision. Throughout the                            procedure, the patient's blood pressure, pulse, and                            oxygen saturations were monitored continuously. The                            [id number]-[id number] ([id number]) Olympus endoscope was introduced                            through the mouth, and advanced to the second part                            of duodenum. The upper GI endoscopy was                            accomplished without difficulty. The patient                            tolerated the procedure well. Scope In: Scope Out: Findings:      A single area of ectopic gastric mucosa was found in the upper third of       the esophagus.      A 3 cm hiatal hernia was present.      A non-obstructing Schatzki ring was found in the lower third of the       esophagus.      LA Grade A (one or more mucosal breaks less than 5 mm, not extending       between tops of 2 mucosal folds) esophagitis with no bleeding was found       in the lower third of the esophagus.      Multiple small angioectasias with bleeding were found at the       gastroesophageal junction, gastric cardia, and in the proximal gastric       body. Coagulation for hemostasis using argon plasma was successful. The       mucosa was friable in these areas as well, but overall improved compared       with the previous study.  Excessive fluid was found in the gastric body. 650 cc was removed with       endoscopic suction.      The incisura, gastric antrum and pylorus were normal.      Three previously-placed endoclips were found in the gastric body.      The examined duodenum was normal. Impression:               - Ectopic gastric mucosa in the upper third of the                            esophagus.                           - 3 cm hiatal hernia.                           - Non-obstructing Schatzki ring.                           - LA Grade A esophagitis with no  bleeding.                           - Multiple bleeding angioectasias in the stomach.                            Treated with argon plasma coagulation (APC).                           - Excessive gastric fluid.                           - Normal incisura, antrum and pylorus.                           - An endoclip was found in the stomach.                           - Normal examined duodenum.                           - No specimens collected. Recommendation:           - Return patient to hospital ward for ongoing care.                           - Advance diet as tolerated.                           - Use Protonix (pantoprazole) 40 mg PO BID for 8                            weeks to promote mucosal healing, then reduce to 40                            mg daily for continued gastric prophylaxis.                           -  Unable to use carafate long term due to ESRD.                           - Anticoagulation already previously discontinued                            permanently.                           - Continue daily CBC checks while inpatient with                            additional blood products as needed.                           - Please do not hesitate to contact the inpatient                            service with additional questions or concerns. Procedure Code(s):        --- Professional ---                           [phone], Esophagogastroduodenoscopy, flexible,                            transoral; with control of bleeding, any method Diagnosis Code(s):        --- Professional ---                           Q40.2, Other specified congenital malformations of                            stomach                           K44.9, Diaphragmatic hernia without obstruction or                            gangrene                           K22.2, Esophageal obstruction                           K20.90, Esophagitis, unspecified without bleeding                           K31.811,  Angiodysplasia of stomach and duodenum                            with bleeding                           T18.2XXA, Foreign body in stomach, initial encounter                           D62, Acute posthemorrhagic anemia CPT copyright 2019 American Medical Association.  All rights reserved. The codes documented in this report are preliminary and upon coder review may  be revised to meet current compliance requirements. Gerrit Heck, MD 08/02/2021 1:04:36 PM Number of Addenda: 0

## 2021-08-03 ENCOUNTER — Encounter (HOSPITAL_COMMUNITY): Payer: Self-pay | Admitting: Gastroenterology

## 2021-08-03 DIAGNOSIS — N186 End stage renal disease: Secondary | ICD-10-CM | POA: Diagnosis not present

## 2021-08-03 DIAGNOSIS — I10 Essential (primary) hypertension: Secondary | ICD-10-CM | POA: Diagnosis not present

## 2021-08-03 DIAGNOSIS — D62 Acute posthemorrhagic anemia: Secondary | ICD-10-CM | POA: Diagnosis not present

## 2021-08-03 DIAGNOSIS — I5022 Chronic systolic (congestive) heart failure: Secondary | ICD-10-CM | POA: Diagnosis not present

## 2021-08-03 LAB — CBC WITH DIFFERENTIAL/PLATELET
Abs Immature Granulocytes: 0.47 10*3/uL — ABNORMAL HIGH (ref 0.00–0.07)
Basophils Absolute: 0 10*3/uL (ref 0.0–0.1)
Basophils Relative: 0 %
Eosinophils Absolute: 0 10*3/uL (ref 0.0–0.5)
Eosinophils Relative: 0 %
HCT: 24.6 % — ABNORMAL LOW (ref 39.0–52.0)
Hemoglobin: 8.4 g/dL — ABNORMAL LOW (ref 13.0–17.0)
Immature Granulocytes: 3 %
Lymphocytes Relative: 1 %
Lymphs Abs: 0.2 10*3/uL — ABNORMAL LOW (ref 0.7–4.0)
MCH: 32.9 pg (ref 26.0–34.0)
MCHC: 34.1 g/dL (ref 30.0–36.0)
MCV: 96.5 fL (ref 80.0–100.0)
Monocytes Absolute: 1.5 10*3/uL — ABNORMAL HIGH (ref 0.1–1.0)
Monocytes Relative: 10 %
Neutro Abs: 12.5 10*3/uL — ABNORMAL HIGH (ref 1.7–7.7)
Neutrophils Relative %: 86 %
Platelets: 253 10*3/uL (ref 150–400)
RBC: 2.55 MIL/uL — ABNORMAL LOW (ref 4.22–5.81)
RDW: 23 % — ABNORMAL HIGH (ref 11.5–15.5)
WBC: 14.8 10*3/uL — ABNORMAL HIGH (ref 4.0–10.5)
nRBC: 5.6 % — ABNORMAL HIGH (ref 0.0–0.2)

## 2021-08-03 LAB — CBC
HCT: 25.7 % — ABNORMAL LOW (ref 39.0–52.0)
Hemoglobin: 8.9 g/dL — ABNORMAL LOW (ref 13.0–17.0)
MCH: 33.3 pg (ref 26.0–34.0)
MCHC: 34.6 g/dL (ref 30.0–36.0)
MCV: 96.3 fL (ref 80.0–100.0)
Platelets: 210 10*3/uL (ref 150–400)
RBC: 2.67 MIL/uL — ABNORMAL LOW (ref 4.22–5.81)
RDW: 23.5 % — ABNORMAL HIGH (ref 11.5–15.5)
WBC: 10.4 10*3/uL (ref 4.0–10.5)
nRBC: 9.4 % — ABNORMAL HIGH (ref 0.0–0.2)

## 2021-08-03 MED ORDER — BISACODYL 10 MG RE SUPP: 10.0000 mg | Freq: Every day | RECTAL | Status: DC

## 2021-08-03 MED ORDER — SENNOSIDES-DOCUSATE SODIUM 8.6-50 MG PO TABS
2.0000 | ORAL_TABLET | Freq: Every day | ORAL | Status: DC
  Administered 2021-08-06: 2 via ORAL
  Filled 2021-08-03 (×5): qty 2

## 2021-08-03 MED ORDER — ALBUMIN HUMAN 25 % IV SOLN
INTRAVENOUS | Status: AC
Start: 2021-08-03 — End: 2021-08-03
  Administered 2021-08-03: 25 g
  Filled 2021-08-03: qty 50

## 2021-08-03 MED ORDER — HYDROXYUREA 500 MG PO CAPS: 500.0000 mg | ORAL_CAPSULE | Freq: Every day | ORAL | Status: DC

## 2021-08-03 MED ORDER — BISACODYL 10 MG RE SUPP
10.0000 mg | Freq: Every day | RECTAL | Status: AC
  Administered 2021-08-03: 10 mg via RECTAL
  Filled 2021-08-03: qty 1

## 2021-08-03 MED ORDER — POLYETHYLENE GLYCOL 3350 17 G PO PACK
17.0000 g | PACK | Freq: Every day | ORAL | Status: DC
  Administered 2021-08-03 – 2021-08-04 (×2): 17 g via ORAL
  Filled 2021-08-03 (×4): qty 1

## 2021-08-03 MED ORDER — HYDROXYUREA 500 MG PO CAPS
500.0000 mg | ORAL_CAPSULE | Freq: Every day | ORAL | Status: DC
  Administered 2021-08-03 – 2021-08-11 (×6): 500 mg via ORAL
  Filled 2021-08-03 (×9): qty 1

## 2021-08-03 NOTE — Evaluation (Signed)
Physical Therapy Evaluation and Discharge Patient Details Name: Patrick [name] MRN: [id number] DOB: March 13, 1956 Today's Date: 08/03/2021  History of Present Illness  66 y.o. male with medical history significant of ESRD on HD, Sickle cell disease, CHF, A-fib, recent GI bleed who presents with complaint of shortness of breath with exertion and fatigue. Hgb 5.2; 2/15 upper endoscopy showed bleeding AVMs  Clinical Impression   Patient evaluated by Physical Therapy with no further acute PT needs identified. Patient is independent with ambulation and at his baseline. PT is signing off. Thank you for this referral.        Recommendations for follow up therapy are one component of a multi-disciplinary discharge planning process, led by the attending physician.  Recommendations may be updated based on patient status, additional functional criteria and insurance authorization.  Follow Up Recommendations No PT follow up    Assistance Recommended at Discharge PRN  Patient can return home with the following  Assist for transportation (uses [facility] to get to HD)    Equipment Recommendations None recommended by PT  Recommendations for Other Services       Functional Status Assessment Patient has not had a recent decline in their functional status     Precautions / Restrictions Precautions Precautions: Other (comment) Precaution Comments: low vision Restrictions Weight Bearing Restrictions: No      Mobility  Bed Mobility Overal bed mobility: Independent                  Transfers Overall transfer level: Independent Equipment used: None                    Ambulation/Gait Ambulation/Gait assistance: Independent Gait Distance (Feet): 15 Feet Assistive device: None Gait Pattern/deviations: Step-through pattern, Shuffle       General Gait Details: patient walks guarded/shuffles due to low vision  Stairs            Wheelchair Mobility    Modified Rankin  (Stroke Patients Only)       Balance Overall balance assessment: Independent                                           Pertinent Vitals/Pain Pain Assessment Pain Assessment: No/denies pain    Home Living Family/patient expects to be discharged to:: Private residence Living Arrangements: Alone Available Help at Discharge: Family;Available PRN/intermittently Type of Home: Apartment Home Access: Stairs to enter Entrance Stairs-Rails: Psychiatric nurse of Steps: flight   Home Layout: One level Home Equipment: None      Prior Function Prior Level of Function : Needs assist             Mobility Comments: no use of AD for mobility, denies recent falls ADLs Comments: has begun having some meals delivered (not on a regular schedule per pt)     Hand Dominance   Dominant Hand: Right    Extremity/Trunk Assessment   Upper Extremity Assessment Upper Extremity Assessment: Defer to OT evaluation    Lower Extremity Assessment Lower Extremity Assessment: Generalized weakness    Cervical / Trunk Assessment Cervical / Trunk Assessment: Normal  Communication   Communication: No difficulties  Cognition Arousal/Alertness: Awake/alert Behavior During Therapy: WFL for tasks assessed/performed Overall Cognitive Status: Within Functional Limits for tasks assessed  General Comments General comments (skin integrity, edema, etc.): patient s/p HD today and did not want to walk far    Exercises     Assessment/Plan    PT Assessment Patient does not need any further PT services  PT Problem List         PT Treatment Interventions      PT Goals (Current goals can be found in the Care Plan section)  Acute Rehab PT Goals PT Goal Formulation: All assessment and education complete, DC therapy    Frequency       Co-evaluation               AM-PAC PT "6 Clicks" Mobility   Outcome Measure Help needed turning from your back to your side while in a flat bed without using bedrails?: None Help needed moving from lying on your back to sitting on the side of a flat bed without using bedrails?: None Help needed moving to and from a bed to a chair (including a wheelchair)?: None Help needed standing up from a chair using your arms (e.g., wheelchair or bedside chair)?: None Help needed to walk in hospital room?: None Help needed climbing 3-5 steps with a railing? : None 6 Click Score: 24    End of Session   Activity Tolerance: Patient tolerated treatment well Patient left: in chair;with call bell/phone within reach;with chair alarm set Nurse Communication: Mobility status PT Visit Diagnosis: Difficulty in walking, not elsewhere classified (R26.2)    Time: 6659-9357 PT Time Calculation (min) (ACUTE ONLY): 19 min   Charges:   PT Evaluation $PT Eval Low Complexity: [place], PT Acute Rehabilitation Services  Pager [phone] Office [phone]   Rexanne Mano 08/03/2021, 3:59 PM

## 2021-08-03 NOTE — Progress Notes (Signed)
PROGRESS NOTE        PATIENT DETAILS Name: Patrick [name] Age: 66 y.o. Sex: male Date of Birth: 1956/01/10 Admit Date: 07/31/2021 Admitting Physician Eben Burow, MD ENM:[id number], Asencion Partridge, FNP  Brief Summary: 66 year old male with history of ESRD on HD, sickle cell disease, permanent atrial fibrillation, history of UGI due to AVMs, recent history (December 2022) of choledocholithiasis and ischemic small bowel requiring cholecystectomy/biliary stent placement/bowel resection-referred to the ED from HD center for hemoglobin of 5.8-subsequent EGD on 2/15 showed bleeding AVMs.  Significant events: 2/13>> referred to the ED from HD center for evaluation of hemoglobin of 5.8.    Significant imaging studies:  Significant microbiology data: 2/13>> COVID/influenza PCR: Negative  Procedures: 2/15>> EGD: Multiple bleeding angiectasia's in the stomach treated with argon plasma coagulation.  Consults: GI, nephrology  Subjective: Feels nauseous-claims he had several episodes of vomiting yesterday.  Complains of worsening of his chronic back pain as well.  Objective: Vitals: Blood pressure 92/64, pulse 82, temperature (!) 97.5 F (36.4 C), temperature source Oral, resp. rate 11, height 5\' 11"  (1.803 m), weight 60.9 kg, SpO2 98 %.   Exam: Gen Exam:Alert awake-not in any distress HEENT:atraumatic, normocephalic Chest: B/L clear to auscultation anteriorly CVS:S1S2 regular Abdomen: Soft-mild tender-dressing in place-not soaked. Extremities:no edema Neurology: Non focal Skin: no rash   Pertinent Labs/Radiology: CBC Latest Ref Rng & Units 08/03/2021 08/02/2021 08/01/2021  WBC 4.0 - 10.5 K/uL 14.8(H) - -  Hemoglobin 13.0 - 17.0 g/dL 8.4(L) 8.2(L) 8.6(L)  Hematocrit 39.0 - 52.0 % 24.6(L) 23.4(L) 24.3(L)  Platelets 150 - 400 K/uL 253 - -    Lab Results  Component Value Date   NA 136 08/02/2021   K 4.1 08/02/2021   CL 98 08/02/2021   CO2 23  08/02/2021      Assessment/Plan: UGI bleeding with acute blood loss anemia: EGD on 2/15 showed bleeding AVMs-hemoglobin has stabilized after 3 units of PRBC transfusion so far.  No melanotic stool since hospitalization (has not had a BM since admit).  Continue PPI twice daily x8 weeks and then indefinitely daily.    Nausea/vomiting: Ongoing for the past 24 hours-no vomiting today noted by nursing staff-but per patient he vomited yesterday.  Abdominal exam looks relatively stable.  Continue antiemetics-he claims he has not had a bowel movement since admission-we will place him on some laxative/Dulcolax suppository and see if this improves his GI symptoms.  If no improvement-we will pursue imaging.  Recent history of choledocholithiasis-s/p ERCP with pancreatic/biliary stents (removed) followed by open cholecystectomy and resection of ischemic small bowel on 05/24/2021  ESRD: On HD per nephrology.  Chronic HFrEF (EF 40-45% by echo December 2022): Volume status stable-volume removal with HD.  Permanent atrial fibrillation: Rate controlled with Coreg-not a candidate for anticoagulation due to recurrent GI bleeding.  HTN: BP stable-not on any antihypertensives.  Sickle cell disease: Complaining of some worsening of his back pain-continue as needed narcotics-on Hydrea-hemoglobin stable.  BMI: Estimated body mass index is 18.73 kg/m as calculated from the following:   Height as of this encounter: 5\' 11"  (1.803 m).   Weight as of this encounter: 60.9 kg.   Code status:   Code Status: Full Code   DVT Prophylaxis: SCDs Start: 07/31/21 2024   Family Communication: None at bedside  Disposition Plan: Status is: Inpatient Remains inpatient appropriate because: Upper GI bleeding-s/p  EGD with APC of bleeding AVMs-diet being advanced-with intermittent nausea/vomiting and back pain.  Needs further inpatient monitoring to ensure stability before consideration of discharge.    Planned Discharge  Destination:Home   Diet: Diet Order             Diet full liquid Room service appropriate? Yes; Fluid consistency: Thin  Diet effective now                     Antimicrobial agents: Anti-infectives (From admission, onward)    None        MEDICATIONS: Scheduled Meds:  (feeding supplement) PROSource Plus  30 mL Oral BID BM   allopurinol  100 mg Oral Daily   bisacodyl  10 mg Rectal Daily   Chlorhexidine Gluconate Cloth  6 each Topical Q0600   Chlorhexidine Gluconate Cloth  6 each Topical Q0600   ferric citrate  420 mg Oral TID with meals   folic acid  1 mg Oral Daily   hydroxyurea  500 mg Oral QAC breakfast   pantoprazole (PROTONIX) IV  40 mg Intravenous Q12H   polyethylene glycol  17 g Oral Daily   senna-docusate  2 tablet Oral QHS   sodium chloride flush  3 mL Intravenous Q12H   Continuous Infusions:  sodium chloride     sodium chloride     sodium chloride     sodium chloride 100 mL (08/02/21 1201)   promethazine (PHENERGAN) injection (IM or IVPB) Stopped (08/02/21 0135)   PRN Meds:.sodium chloride, sodium chloride, sodium chloride, acetaminophen **OR** acetaminophen, heparin, HYDROmorphone (DILAUDID) injection, lidocaine (PF), lidocaine-prilocaine, ondansetron **OR** ondansetron (ZOFRAN) IV, oxyCODONE, pentafluoroprop-tetrafluoroeth, promethazine (PHENERGAN) injection (IM or IVPB), sodium chloride flush   I have personally reviewed following labs and imaging studies  LABORATORY DATA: CBC: Recent Labs  Lab 07/31/21 1611 07/31/21 1715 08/01/21 0645 08/01/21 0758 08/01/21 1503 08/01/21 2338 08/02/21 0143 08/03/21 0504  WBC 8.9  --  10.5 11.6*  --   --   --  14.8*  NEUTROABS  --   --   --   --   --   --   --  12.5*  HGB 5.3*   < > 7.0* 7.6* 6.6* 8.6* 8.2* 8.4*  HCT 14.9*   < > 19.2* 20.7* 18.5* 24.3* 23.4* 24.6*  MCV 100.7*  --  92.8 91.6  --   --   --  96.5  PLT 227  --  218 223  --   --   --  253   < > = values in this interval not displayed.      Basic Metabolic Panel: Recent Labs  Lab 07/31/21 1611 07/31/21 1715 08/01/21 0645 08/01/21 0758 08/02/21 0143  NA 129* 128* 130* 128* 136  K 4.2 4.0 4.3 4.9 4.1  CL 90* 90* 89* 90* 98  CO2 24  --  25 23 23   GLUCOSE 84 84 75 72 65*  BUN 67* 62* 72* 75* 28*  CREATININE 6.16* 7.80* 6.37* 6.26* 2.93*  CALCIUM 8.7*  --  8.7* 8.7* 8.5*  MG 1.5*  --   --   --   --   PHOS  --   --   --  4.9*  --      GFR: Estimated Creatinine Clearance: 22.2 mL/min (A) (by C-G formula based on SCr of 2.93 mg/dL (H)).  Liver Function Tests: Recent Labs  Lab 07/31/21 1611 08/01/21 0758  AST 14*  --   ALT 10  --   ALKPHOS  237*  --   BILITOT 1.4*  --   PROT 6.5  --   ALBUMIN <1.5* <1.5*    No results for input(s): LIPASE, AMYLASE in the last 168 hours. No results for input(s): AMMONIA in the last 168 hours.  Coagulation Profile: No results for input(s): INR, PROTIME in the last 168 hours.  Cardiac Enzymes: No results for input(s): CKTOTAL, CKMB, CKMBINDEX, TROPONINI in the last 168 hours.  BNP (last 3 results) No results for input(s): PROBNP in the last 8760 hours.  Lipid Profile: No results for input(s): CHOL, HDL, LDLCALC, TRIG, CHOLHDL, LDLDIRECT in the last 72 hours.  Thyroid Function Tests: No results for input(s): TSH, T4TOTAL, FREET4, T3FREE, THYROIDAB in the last 72 hours.  Anemia Panel: No results for input(s): VITAMINB12, FOLATE, FERRITIN, TIBC, IRON, RETICCTPCT in the last 72 hours.  Urine analysis:    Component Value Date/Time   COLORURINE STRAW (A) 06/02/2018 1900   APPEARANCEUR CLEAR 06/02/2018 1900   APPEARANCEUR Clear 05/31/2017 0902   LABSPEC 1.006 06/02/2018 1900   PHURINE 7.0 06/02/2018 1900   GLUCOSEU NEGATIVE 06/02/2018 1900   HGBUR SMALL (A) 06/02/2018 1900   BILIRUBINUR neg 01/26/2020 1124   BILIRUBINUR Negative 05/31/2017 0902   KETONESUR negative 09/08/2018 1621   KETONESUR NEGATIVE 06/02/2018 1900   PROTEINUR Positive (A) 01/26/2020 1124    PROTEINUR 30 (A) 06/02/2018 1900   UROBILINOGEN 1.0 01/26/2020 1124   UROBILINOGEN 0.2 09/14/2013 1354   NITRITE neg 01/26/2020 1124   NITRITE NEGATIVE 06/02/2018 1900   LEUKOCYTESUR Negative 01/26/2020 1124   LEUKOCYTESUR Negative 05/31/2017 0902    Sepsis Labs: Lactic Acid, Venous    Component Value Date/Time   LATICACIDVEN 1.3 06/27/2021 0830    MICROBIOLOGY: Recent Results (from the past 240 hour(s))  Resp Panel by RT-PCR (Flu A&B, Covid) Nasopharyngeal Swab     Status: None   Collection Time: 07/31/21 12:14 AM   Specimen: Nasopharyngeal Swab; Nasopharyngeal(NP) swabs in vial transport medium  Result Value Ref Range Status   SARS Coronavirus 2 by RT PCR NEGATIVE NEGATIVE Final    Comment: (NOTE) SARS-CoV-2 target nucleic acids are NOT DETECTED.  The SARS-CoV-2 RNA is generally detectable in upper respiratory specimens during the acute phase of infection. The lowest concentration of SARS-CoV-2 viral copies this assay can detect is 138 copies/mL. A negative result does not preclude SARS-Cov-2 infection and should not be used as the sole basis for treatment or other patient management decisions. A negative result may occur with  improper specimen collection/handling, submission of specimen other than nasopharyngeal swab, presence of viral mutation(s) within the areas targeted by this assay, and inadequate number of viral copies(<138 copies/mL). A negative result must be combined with clinical observations, patient history, and epidemiological information. The expected result is Negative.  Fact Sheet for Patients:  EntrepreneurPulse.com.au  Fact Sheet for Healthcare Providers:  IncredibleEmployment.be  This test is no t yet approved or cleared by the [place] FDA and  has been authorized for detection and/or diagnosis of SARS-CoV-2 by FDA under an Emergency Use Authorization (EUA). This EUA will remain  in effect (meaning this  test can be used) for the duration of the COVID-19 declaration under Section 564(b)(1) of the Act, 21 U.S.C.section 360bbb-3(b)(1), unless the authorization is terminated  or revoked sooner.       Influenza A by PCR NEGATIVE NEGATIVE Final   Influenza B by PCR NEGATIVE NEGATIVE Final    Comment: (NOTE) The Xpert Xpress SARS-CoV-2/FLU/RSV plus assay is intended as an aid  in the diagnosis of influenza from Nasopharyngeal swab specimens and should not be used as a sole basis for treatment. Nasal washings and aspirates are unacceptable for Xpert Xpress SARS-CoV-2/FLU/RSV testing.  Fact Sheet for Patients: EntrepreneurPulse.com.au  Fact Sheet for Healthcare Providers: IncredibleEmployment.be  This test is not yet approved or cleared by the [place] FDA and has been authorized for detection and/or diagnosis of SARS-CoV-2 by FDA under an Emergency Use Authorization (EUA). This EUA will remain in effect (meaning this test can be used) for the duration of the COVID-19 declaration under Section 564(b)(1) of the Act, 21 U.S.C. section 360bbb-3(b)(1), unless the authorization is terminated or revoked.  Performed at [facility], [place] [address] [place], [place] [address]   MRSA Next Gen by PCR, Nasal     Status: None   Collection Time: 08/01/21  5:54 AM   Specimen: Nasal Mucosa; Nasal Swab  Result Value Ref Range Status   MRSA by PCR Next Gen NOT DETECTED NOT DETECTED Final    Comment: (NOTE) The GeneXpert MRSA Assay (FDA approved for NASAL specimens only), is one component of a comprehensive MRSA colonization surveillance program. It is not intended to diagnose MRSA infection nor to guide or monitor treatment for MRSA infections. Test performance is not FDA approved in patients less than 13 years old. Performed at [facility], [place] [address], [place], [place] [address]     RADIOLOGY STUDIES/RESULTS: No results found.   LOS:  3 days   Oren Binet, MD  [facility]  To contact the attending provider between 7A-7P or the covering provider during after hours 7P-7A, please log into the web site [email or web address] and access using universal [place] password for that web site. If you do not have the password, please call the hospital operator.  08/03/2021, 12:15 PM

## 2021-08-03 NOTE — Progress Notes (Signed)
[facility] Progress Note   Subjective:   Patient seen and examined at bedside in HD.  Blood pressure soft starting out.  Admits to nausea present prior to HD.  Continues to have intermittent vomiting.  Denies CP, SOB and diarrhea. EGD yesterday with multiple bleeding angiectasias's in stomach treated with argon plasma coagulation.   Objective Vitals:   08/02/21 2000 08/03/21 0000 08/03/21 0400 08/03/21 0705  BP: 101/63 102/71 97/69   Pulse: 85 87 97   Resp: 14 15 15    Temp: 98.3 F (36.8 C) 97.9 F (36.6 C) 97.9 F (36.6 C)   TempSrc: Oral Oral Oral   SpO2: 90% 95% 99%   Weight:   60.9 kg 60.9 kg  Height:       Physical Exam General:chronically ill appearing male in NAD Heart:regular rate, irregular rhythm Lungs:CTAB, nml WOB on RA Abdomen:soft, NTND Extremities:+pedal edema Dialysis Access: RU AVF in use   Filed Weights   08/01/21 1040 08/03/21 0400 08/03/21 0705  Weight: 59 kg 60.9 kg 60.9 kg    Intake/Output Summary (Last 24 hours) at 08/03/2021 0800 Last data filed at 08/03/2021 0517 Gross per 24 hour  Intake 100 ml  Output 800 ml  Net -700 ml    Additional Objective Labs: Basic Metabolic Panel: Recent Labs  Lab 08/01/21 0645 08/01/21 0758 08/02/21 0143  NA 130* 128* 136  K 4.3 4.9 4.1  CL 89* 90* 98  CO2 25 23 23   GLUCOSE 75 72 65*  BUN 72* 75* 28*  CREATININE 6.37* 6.26* 2.93*  CALCIUM 8.7* 8.7* 8.5*  PHOS  --  4.9*  --    Liver Function Tests: Recent Labs  Lab 07/31/21 1611 08/01/21 0758  AST 14*  --   ALT 10  --   ALKPHOS 237*  --   BILITOT 1.4*  --   PROT 6.5  --   ALBUMIN <1.5* <1.5*   CBC: Recent Labs  Lab 07/31/21 1611 07/31/21 1715 08/01/21 0645 08/01/21 0758 08/01/21 1503 08/01/21 2338 08/02/21 0143 08/03/21 0504  WBC 8.9  --  10.5 11.6*  --   --   --  14.8*  NEUTROABS  --   --   --   --   --   --   --  12.5*  HGB 5.3*   < > 7.0* 7.6*   < > 8.6* 8.2* 8.4*  HCT 14.9*   < > 19.2* 20.7*   < > 24.3* 23.4*  24.6*  MCV 100.7*  --  92.8 91.6  --   --   --  96.5  PLT 227  --  218 223  --   --   --  253   < > = values in this interval not displayed.    Medications:  sodium chloride     sodium chloride     sodium chloride     sodium chloride 100 mL (08/02/21 1201)   albumin human     promethazine (PHENERGAN) injection (IM or IVPB) Stopped (08/02/21 0135)    (feeding supplement) PROSource Plus  30 mL Oral BID BM   allopurinol  100 mg Oral Daily   Chlorhexidine Gluconate Cloth  6 each Topical Q0600   Chlorhexidine Gluconate Cloth  6 each Topical Q0600   ferric citrate  420 mg Oral TID with meals   folic acid  1 mg Oral Daily   hydroxyurea  500 mg Oral QAC breakfast   pantoprazole (PROTONIX) IV  40 mg Intravenous Q12H  sodium chloride flush  3 mL Intravenous Q12H    Dialysis Orders: TTS - AF  4hrs, BFR 400, DFR 500,  EDW 55.5kg, 3K/ 2Ca UFP2   Access: RU AVF  Heparin none Mircera 225 mcg q2wks - last 07/20/21 Hectorol 57mcg IV qHD   Sensipar 30mg  qHD   Assessment/Plan:  Symptomatic acute on chronic anemia of CKD - Hx recurrent GIB. EGD planned for today.  Hgb 5.2 on admit s/p 2 unit pRBC improved to 7.6, then drop to 6.6 post HD.  Given additional 1 unit pRBC now improved to 8.4.   On PPI BID.     ESRD -  on TTS.  Continue regular schedule.  Hyponatremia -  improved.  Hypotension/volume  - Avoiding midodrine with prolonged QT. Does not appear volume overloaded.  Will try to get standing weights if able. UF limited by hypotension yesterday.  Will try to pull fluid tomorrow as BP allows.   Secondary Hyperparathyroidism -  Calcium and phos in goal.  Continue sensipar, binders and hectorol.   Severe protein calorie malnutrition - Currently NPO. renal diet w/fluid restrictions when advanced.  Alb <1.5, give protein supplements.  A fib - not on eliquis d/t GIB Recent SBO s/p surgery - per PMD Sickle cell anemia - chronic  Jen Mow, PA-C [facility] 08/03/2021,8:00  AM  LOS: 3 days

## 2021-08-03 NOTE — Anesthesia Postprocedure Evaluation (Signed)
Anesthesia Post Note  Patient: Patrick [name]  Procedure(s) Performed: ESOPHAGOGASTRODUODENOSCOPY (EGD) WITH PROPOFOL HOT HEMOSTASIS (ARGON PLASMA COAGULATION/BICAP)     Patient location during evaluation: PACU Anesthesia Type: MAC Level of consciousness: awake and alert Pain management: pain level controlled Vital Signs Assessment: post-procedure vital signs reviewed and stable Respiratory status: spontaneous breathing Cardiovascular status: stable Anesthetic complications: no   No notable events documented.  Last Vitals:  Vitals:   08/03/21 0930 08/03/21 1000  BP: (!) 92/55 (!) 87/57  Pulse: 79 69  Resp: 20   Temp:    SpO2:      Last Pain:  Vitals:   08/03/21 0400  TempSrc: Oral  PainSc:                  Nolon Nations

## 2021-08-04 ENCOUNTER — Inpatient Hospital Stay (HOSPITAL_COMMUNITY): Payer: HMO

## 2021-08-04 DIAGNOSIS — D62 Acute posthemorrhagic anemia: Secondary | ICD-10-CM | POA: Diagnosis not present

## 2021-08-04 DIAGNOSIS — N186 End stage renal disease: Secondary | ICD-10-CM | POA: Diagnosis not present

## 2021-08-04 DIAGNOSIS — I5022 Chronic systolic (congestive) heart failure: Secondary | ICD-10-CM | POA: Diagnosis not present

## 2021-08-04 DIAGNOSIS — I10 Essential (primary) hypertension: Secondary | ICD-10-CM | POA: Diagnosis not present

## 2021-08-04 LAB — RENAL FUNCTION PANEL
Albumin: 1.5 g/dL — ABNORMAL LOW (ref 3.5–5.0)
Anion gap: 10 (ref 5–15)
BUN: 22 mg/dL (ref 8–23)
CO2: 27 mmol/L (ref 22–32)
Calcium: 8.7 mg/dL — ABNORMAL LOW (ref 8.9–10.3)
Chloride: 99 mmol/L (ref 98–111)
Creatinine, Ser: 2.72 mg/dL — ABNORMAL HIGH (ref 0.61–1.24)
GFR, Estimated: 25 mL/min — ABNORMAL LOW (ref >60)
Glucose, Bld: 79 mg/dL (ref 70–99)
Phosphorus: 3.3 mg/dL (ref 2.5–4.6)
Potassium: 3.1 mmol/L — ABNORMAL LOW (ref 3.5–5.1)
Sodium: 136 mmol/L (ref 135–145)

## 2021-08-04 LAB — CBC WITH DIFFERENTIAL/PLATELET
Abs Immature Granulocytes: 0.16 10*3/uL — ABNORMAL HIGH (ref 0.00–0.07)
Basophils Absolute: 0 10*3/uL (ref 0.0–0.1)
Basophils Relative: 0 %
Eosinophils Absolute: 0.1 10*3/uL (ref 0.0–0.5)
Eosinophils Relative: 1 %
HCT: 22.1 % — ABNORMAL LOW (ref 39.0–52.0)
Hemoglobin: 7.8 g/dL — ABNORMAL LOW (ref 13.0–17.0)
Immature Granulocytes: 2 %
Lymphocytes Relative: 3 %
Lymphs Abs: 0.3 10*3/uL — ABNORMAL LOW (ref 0.7–4.0)
MCH: 33.8 pg (ref 26.0–34.0)
MCHC: 35.3 g/dL (ref 30.0–36.0)
MCV: 95.7 fL (ref 80.0–100.0)
Monocytes Absolute: 1.4 10*3/uL — ABNORMAL HIGH (ref 0.1–1.0)
Monocytes Relative: 15 %
Neutro Abs: 7.6 10*3/uL (ref 1.7–7.7)
Neutrophils Relative %: 79 %
Platelets: 208 10*3/uL (ref 150–400)
RBC: 2.31 MIL/uL — ABNORMAL LOW (ref 4.22–5.81)
RDW: 23.1 % — ABNORMAL HIGH (ref 11.5–15.5)
WBC: 9.6 10*3/uL (ref 4.0–10.5)
nRBC: 8.9 % — ABNORMAL HIGH (ref 0.0–0.2)

## 2021-08-04 LAB — HEPATIC FUNCTION PANEL
ALT: 9 U/L (ref 0–44)
AST: 10 U/L — ABNORMAL LOW (ref 15–41)
Albumin: 1.5 g/dL — ABNORMAL LOW (ref 3.5–5.0)
Alkaline Phosphatase: 180 U/L — ABNORMAL HIGH (ref 38–126)
Bilirubin, Direct: 0.7 mg/dL — ABNORMAL HIGH (ref 0.0–0.2)
Indirect Bilirubin: 0.8 mg/dL (ref 0.3–0.9)
Total Bilirubin: 1.5 mg/dL — ABNORMAL HIGH (ref 0.3–1.2)
Total Protein: 5.9 g/dL — ABNORMAL LOW (ref 6.5–8.1)

## 2021-08-04 LAB — CBC
HCT: 27.3 % — ABNORMAL LOW (ref 39.0–52.0)
Hemoglobin: 9.2 g/dL — ABNORMAL LOW (ref 13.0–17.0)
MCH: 33.5 pg (ref 26.0–34.0)
MCHC: 33.7 g/dL (ref 30.0–36.0)
MCV: 99.3 fL (ref 80.0–100.0)
Platelets: UNDETERMINED 10*3/uL (ref 150–400)
RBC: 2.75 MIL/uL — ABNORMAL LOW (ref 4.22–5.81)
RDW: 24.1 % — ABNORMAL HIGH (ref 11.5–15.5)
WBC: 12.8 10*3/uL — ABNORMAL HIGH (ref 4.0–10.5)
nRBC: 8.5 % — ABNORMAL HIGH (ref 0.0–0.2)

## 2021-08-04 MED ORDER — DARBEPOETIN ALFA 200 MCG/0.4ML IJ SOSY
200.0000 ug | PREFILLED_SYRINGE | INTRAMUSCULAR | Status: DC
  Administered 2021-08-05: 200 ug via INTRAVENOUS
  Filled 2021-08-04: qty 0.4

## 2021-08-04 MED ORDER — POTASSIUM CHLORIDE CRYS ER 20 MEQ PO TBCR
20.0000 meq | EXTENDED_RELEASE_TABLET | Freq: Once | ORAL | Status: AC
Start: 2021-08-04 — End: 2021-08-04
  Administered 2021-08-04: 20 meq via ORAL
  Filled 2021-08-04: qty 1

## 2021-08-04 MED ORDER — IOHEXOL 350 MG/ML SOLN
100.0000 mL | Freq: Once | INTRAVENOUS | Status: AC | PRN
Start: 2021-08-04 — End: 2021-08-04
  Administered 2021-08-04: 100 mL via INTRAVENOUS

## 2021-08-04 MED ORDER — CHLORHEXIDINE GLUCONATE CLOTH 2 % EX PADS
6.0000 | MEDICATED_PAD | Freq: Every day | CUTANEOUS | Status: DC
  Administered 2021-08-04 – 2021-08-11 (×7): 6 via TOPICAL

## 2021-08-04 MED ORDER — PIPERACILLIN-TAZOBACTAM 3.375 G IVPB
3.3750 g | Freq: Three times a day (TID) | INTRAVENOUS | Status: DC
  Administered 2021-08-04 – 2021-08-05 (×2): 3.375 g via INTRAVENOUS
  Filled 2021-08-04 (×4): qty 50

## 2021-08-04 NOTE — Plan of Care (Signed)

## 2021-08-04 NOTE — Progress Notes (Signed)
[facility] night coverage: Spoke with Dr. Rosendo Gros, informed him of CT findings: 1) NGT 2) NPO 3) get IR consult for abscess drainage 4) ABx 5) gen surg will see in AM

## 2021-08-04 NOTE — Progress Notes (Signed)
[facility] Progress Note   Subjective:   Seen in room, reports back is sore today. Denies SOB, CP, palpitations, dizziness, abdominal pain and nausea. K+ 3.1 this AM.   Objective Vitals:   08/03/21 2311 08/04/21 0205 08/04/21 0302 08/04/21 0729  BP: 96/71  97/69 99/82  Pulse: 85  79 79  Resp: 16  20 20   Temp: 97.7 F (36.5 C)  98 F (36.7 C) 97.6 F (36.4 C)  TempSrc: Oral  Oral Oral  SpO2:    99%  Weight:  57.8 kg    Height:       Physical Exam General: Chronically ill appearing male, alert and in NAD Heart: irregular rhythm, rate controlled. No murmurs Lungs: CTA bilaterally without wheezing, rhonchi or rales Abdomen: soft, non-tender, non-distended, +BS Extremities: no edema b/l legs Dialysis Access: RUE AVF + bruit  Additional Objective Labs: Basic Metabolic Panel: Recent Labs  Lab 08/01/21 0758 08/02/21 0143 08/04/21 0241  NA 128* 136 136  K 4.9 4.1 3.1*  CL 90* 98 99  CO2 23 23 27   GLUCOSE 72 65* 79  BUN 75* 28* 22  CREATININE 6.26* 2.93* 2.72*  CALCIUM 8.7* 8.5* 8.7*  PHOS 4.9*  --  3.3   Liver Function Tests: Recent Labs  Lab 07/31/21 1611 08/01/21 0758 08/04/21 0241  AST 14*  --  10*  ALT 10  --  9  ALKPHOS 237*  --  180*  BILITOT 1.4*  --  1.5*  PROT 6.5  --  5.9*  ALBUMIN <1.5* <1.5* 1.5*   1.5*   No results for input(s): LIPASE, AMYLASE in the last 168 hours. CBC: Recent Labs  Lab 08/01/21 0645 08/01/21 0758 08/01/21 1503 08/03/21 0504 08/03/21 1838 08/04/21 0241  WBC 10.5 11.6*  --  14.8* 10.4 9.6  NEUTROABS  --   --   --  12.5*  --  7.6  HGB 7.0* 7.6*   < > 8.4* 8.9* 7.8*  HCT 19.2* 20.7*   < > 24.6* 25.7* 22.1*  MCV 92.8 91.6  --  96.5 96.3 95.7  PLT 218 223  --  253 210 208   < > = values in this interval not displayed.   Blood Culture    Component Value Date/Time   SDES BLOOD LEFT WRIST 06/27/2021 0835   SPECREQUEST  06/27/2021 0835    BOTTLES DRAWN AEROBIC AND ANAEROBIC Blood Culture adequate volume    CULT  06/27/2021 0835    NO GROWTH 5 DAYS Performed at [facility], [place] [address], [place], [place] [address]    REPTSTATUS 07/02/2021 FINAL 06/27/2021 0835    Cardiac Enzymes: No results for input(s): CKTOTAL, CKMB, CKMBINDEX, TROPONINI in the last 168 hours. CBG: No results for input(s): GLUCAP in the last 168 hours. Iron Studies: No results for input(s): IRON, TIBC, TRANSFERRIN, FERRITIN in the last 72 hours. @lablastinr3 @ Studies/Results: No results found. Medications:  sodium chloride     sodium chloride     sodium chloride     sodium chloride 100 mL (08/02/21 1201)   promethazine (PHENERGAN) injection (IM or IVPB) Stopped (08/02/21 0135)    (feeding supplement) PROSource Plus  30 mL Oral BID BM   allopurinol  100 mg Oral Daily   Chlorhexidine Gluconate Cloth  6 each Topical Q0600   Chlorhexidine Gluconate Cloth  6 each Topical Q0600   ferric citrate  420 mg Oral TID with meals   folic acid  1 mg Oral Daily   hydroxyurea  500 mg Oral Daily   pantoprazole (PROTONIX) IV  40 mg Intravenous Q12H   polyethylene glycol  17 g Oral Daily   senna-docusate  2 tablet Oral QHS   sodium chloride flush  3 mL Intravenous Q12H    Dialysis Orders: TTS - AF  4hrs, BFR 400, DFR 500,  EDW 55.5kg, 3K/ 2Ca UFP2   Access: RU AVF  Heparin none Mircera 225 mcg q2wks - last 07/20/21 Hectorol 82mcg IV qHD   Sensipar 30mg  qHD  Assessment/Plan:  Symptomatic acute on chronic anemia of CKD - Hx recurrent GIB. EGD planned for today.  Hgb 5.2 on admit s/p 2 unit pRBC improved to 7.6, then drop to 6.6 post HD.  Given additional 1 unit pRBC now improved to 8.4 -> 7.8 today   On PPI BID.   Due for ESA, will order with next HD  ESRD -  on TTS.  Continue regular schedule.  Hyponatremia -  improved. Hypokalemia: mild, s/p HD. Using higher K bath with dialysis.   Hypotension/volume  - Avoiding midodrine with prolonged QT. Does not appear volume overloaded.  Will try to get standing weights if  able. UF limited by hypotension yesterday.  Will try to pull fluid as BP allows.   Secondary Hyperparathyroidism -  Calcium and phos in goal.  Continue sensipar, binders and hectorol.   Severe protein calorie malnutrition - Currently NPO. renal diet w/fluid restrictions when advanced.  Alb <1.5, give protein supplements.  A fib - not on eliquis d/t GIB Recent SBO s/p surgery - per PMD Sickle cell anemia - chronic  Anice Paganini, PA-C 08/04/2021, 10:10 AM  [facility] Pager: [phone]

## 2021-08-04 NOTE — Progress Notes (Signed)
MRI- Limited study only able to obtain Sag T1, Initially attempted scan on 1.5T scanner but pt would "SAR out" within seconds of scanning not allowing for sequence completion potentially due to ascites. Was eventually able to obtain Sag T1 sequence. Scanner would not run further sequences for more than ~10 seconds before failing to scan, switched to 3T scanner as 1.5T SOLA scanner was unavailable at time of scan for attempt but would likely run into same SAR issues present on initial 1.5T scanner. Upon attempting scan on 3T, near complete signal loss from potential ascites. Unable to obtain any further diagnostic images. Ordering provider made aware.

## 2021-08-04 NOTE — Progress Notes (Signed)
Pharmacy Antibiotic Note  Patrick [name] is a 66 y.o. male admitted on 07/31/2021 with  intr-abdominal infection .  Pharmacy has been consulted for zosyn dosing.  Plan: Zosyn 3.375g IV q8h (4 hour infusion).  Height: 5\' 11"  (180.3 cm) Weight: 57.8 kg (127 lb 6.8 oz) IBW/kg (Calculated) : 75.3  Temp (24hrs), Avg:97.7 F (36.5 C), Min:97.6 F (36.4 C), Max:98 F (36.7 C)  Recent Labs  Lab 07/31/21 1715 08/01/21 0645 08/01/21 0758 08/02/21 0143 08/03/21 0504 08/03/21 1838 08/04/21 0241 08/04/21 1649  WBC  --  10.5 11.6*  --  14.8* 10.4 9.6 12.8*  CREATININE 7.80* 6.37* 6.26* 2.93*  --   --  2.72*  --     Estimated Creatinine Clearance: 22.7 mL/min (A) (by C-G formula based on SCr of 2.72 mg/dL (H)).    No Known Allergies  Antimicrobials this admission: 2/17 Zosyn >>      Microbiology results: 2/14 MRSA PCR: negative  Thank you for allowing pharmacy to be a part of this patients care.  Vaughan Basta BS, PharmD, BCPS Clinical Pharmacist 08/04/2021 7:27 PM

## 2021-08-04 NOTE — Evaluation (Signed)
Occupational Therapy Evaluation Patient Details Name: Patrick [name] MRN: [id number] DOB: [birth year] Today's Date: 08/04/2021   History of Present Illness 66 y.o. male with medical history significant of ESRD on HD, Sickle cell disease, CHF, A-fib, recent GI bleed who presents with complaint of shortness of breath with exertion and fatigue. Hgb 5.2; 2/15 upper endoscopy showed bleeding AVMs   Clinical Impression   Pt admitted for concerns listed above. PTA pt reporting that he was independent with all ADL's and IADL's, as well as using [employer] buses for community mobility. At this time, pt appears near his baseline, limited due to feeling fatigued and nauseous. He has no further OT needs at this time and acute OT will sign off.       Recommendations for follow up therapy are one component of a multi-disciplinary discharge planning process, led by the attending physician.  Recommendations may be updated based on patient status, additional functional criteria and insurance authorization.   Follow Up Recommendations  No OT follow up    Assistance Recommended at Discharge PRN  Patient can return home with the following A little help with bathing/dressing/bathroom;Assistance with cooking/housework    Functional Status Assessment  Patient has had a recent decline in their functional status and demonstrates the ability to make significant improvements in function in a reasonable and predictable amount of time.  Equipment Recommendations  None recommended by OT    Recommendations for Other Services       Precautions / Restrictions Precautions Precautions: Other (comment) Precaution Comments: low vision Restrictions Weight Bearing Restrictions: No      Mobility Bed Mobility Overal bed mobility: Independent                  Transfers Overall transfer level: Independent Equipment used: None                      Balance Overall balance assessment: Independent                                          ADL either performed or assessed with clinical judgement   ADL Overall ADL's : Modified independent                                       General ADL Comments: Pt near baseline, requiring increased time due to nausea and fatigue     Vision Baseline Vision/History: 2 Legally blind Ability to See in Adequate Light: 3 Highly impaired Patient Visual Report: No change from baseline Vision Assessment?: No apparent visual deficits     Perception     Praxis      Pertinent Vitals/Pain Pain Assessment Pain Assessment: No/denies pain     Hand Dominance Right   Extremity/Trunk Assessment Upper Extremity Assessment Upper Extremity Assessment: Overall WFL for tasks assessed   Lower Extremity Assessment Lower Extremity Assessment: Defer to PT evaluation   Cervical / Trunk Assessment Cervical / Trunk Assessment: Normal   Communication Communication Communication: No difficulties   Cognition Arousal/Alertness: Awake/alert Behavior During Therapy: WFL for tasks assessed/performed Overall Cognitive Status: Within Functional Limits for tasks assessed  General Comments       Exercises     Shoulder Instructions      Home Living Family/patient expects to be discharged to:: Private residence Living Arrangements: Alone Available Help at Discharge: Family;Available PRN/intermittently Type of Home: Apartment Home Access: Stairs to enter Entrance Stairs-Number of Steps: flight Entrance Stairs-Rails: Right;Left Home Layout: One level     Bathroom Shower/Tub: Occupational psychologist: Handicapped height Bathroom Accessibility: No   Home Equipment: None          Prior Functioning/Environment Prior Level of Function : Independent/Modified Independent             Mobility Comments: no use of AD for mobility, denies recent falls ADLs  Comments: has begun having some meals delivered (not on a regular schedule per pt)        OT Problem List: Decreased strength;Decreased activity tolerance      OT Treatment/Interventions:      OT Goals(Current goals can be found in the care plan section) Acute Rehab OT Goals Patient Stated Goal: To go home OT Goal Formulation: All assessment and education complete, DC therapy Time For Goal Achievement: 08/04/21 Potential to Achieve Goals: Good  OT Frequency:      Co-evaluation              AM-PAC OT "6 Clicks" Daily Activity     Outcome Measure Help from another person eating meals?: None Help from another person taking care of personal grooming?: None Help from another person toileting, which includes using toliet, bedpan, or urinal?: None Help from another person bathing (including washing, rinsing, drying)?: None Help from another person to put on and taking off regular upper body clothing?: None Help from another person to put on and taking off regular lower body clothing?: None 6 Click Score: 24   End of Session Nurse Communication: Mobility status  Activity Tolerance: Patient tolerated treatment well Patient left: in bed;with call bell/phone within reach  OT Visit Diagnosis: Muscle weakness (generalized) (M62.81)                Time: 4782-9562 OT Time Calculation (min): 11 min Charges:  OT General Charges $OT Visit: 1 Visit OT Evaluation $OT Eval Low Complexity: 1 Low  Verlena Marlette H., OTR/L Acute Rehabilitation  Garlin Batdorf Elane Dontrez Pettis 08/04/2021, 6:06 PM

## 2021-08-04 NOTE — Progress Notes (Signed)
PROGRESS NOTE        PATIENT DETAILS Name: Patrick [name] Age: 66 y.o. Sex: male Date of Birth: Jul 31, 1955 Admit Date: 07/31/2021 Admitting Physician Eben Burow, MD NMM:[id number], Asencion Partridge, FNP  Brief Summary: 66 year old male with history of ESRD on HD, sickle cell disease, permanent atrial fibrillation, history of UGI due to AVMs, recent history (December 2022) of choledocholithiasis and ischemic small bowel requiring cholecystectomy/biliary stent placement/bowel resection-referred to the ED from HD center for hemoglobin of 5.8-subsequent EGD on 2/15 showed bleeding AVMs.  Hospital course complicated by persistent nausea and vomiting-see below for further details.  Significant events: 2/13>> referred to the ED from HD center for evaluation of hemoglobin of 5.8.    Significant imaging studies: 2/17>> MRI LS spine: Limited exams-no acute abnormalities.  Significant microbiology data: 2/13>> COVID/influenza PCR: Negative  Procedures: 2/15>> EGD: Multiple bleeding angiectasia's in the stomach treated with argon plasma coagulation.  Consults: GI, nephrology  Subjective: Was nauseous yesterday and the whole day-and did not eat at all.  Vomited x1 this morning.  Had BM yesterday that was dark in color.  Continues to complain of more than usual back pain.  Abdominal pain persists but better/minimal compared to yesterday.  Objective: Vitals: Blood pressure 97/67, pulse 94, temperature 97.8 F (36.6 C), temperature source Oral, resp. rate 10, height 5\' 11"  (1.803 m), weight 57.8 kg, SpO2 99 %.   Exam: Gen Exam:Alert awake-not in any distress HEENT:atraumatic, normocephalic Chest: B/L clear to auscultation anteriorly CVS:S1S2 regular Abdomen: Soft-minimally tender in the mid abdominal area-midline dressing in place-midline incision without any signs of infection.  Dressing is not soaked. Extremities:no edema Neurology: Non focal exam-easily  moving legs off the bed against gravity and against resistance.  No gross sensory deficits. Skin: no rash   Pertinent Labs/Radiology: CBC Latest Ref Rng & Units 08/04/2021 08/03/2021 08/03/2021  WBC 4.0 - 10.5 K/uL 9.6 10.4 14.8(H)  Hemoglobin 13.0 - 17.0 g/dL 7.8(L) 8.9(L) 8.4(L)  Hematocrit 39.0 - 52.0 % 22.1(L) 25.7(L) 24.6(L)  Platelets 150 - 400 K/uL 208 210 253    Lab Results  Component Value Date   NA 136 08/04/2021   K 3.1 (L) 08/04/2021   CL 99 08/04/2021   CO2 27 08/04/2021      Assessment/Plan: UGI bleeding with acute blood loss anemia: EGD on 2/15 showed bleeding AVMs-hemoglobin has stabilized after 3 units of PRBC transfusion so far.  Patient finally had a BM yesterday that was dark appearing-suspect old blood rather than new bleeding although hemoglobin has decreased somewhat again today (has sickle cell disease).  Rechecking CBC-Per GI-to continue PPI twice daily x8 weeks, and then daily indefinitely.   Nausea/vomiting: Has had persistent nausea for close to 2 days-he finally vomited earlier this morning-abdominal exam is benign-although he complains of some abdominal pain which appears to be very mild.  He finally had a BM yesterday.  Continue antiemetics-given his recent history of open cholecystectomy/resection of ischemic bowel-we will go ahead and obtain a CT abdomen.  If CT does not show major abnormalities-May need to get GI reevaluation.  Recent history of choledocholithiasis-s/p ERCP with pancreatic/biliary stents (removed) followed by open cholecystectomy and resection of ischemic small bowel on 05/24/2021  ESRD: On HD per nephrology.  Chronic HFrEF (EF 40-45% by echo December 2022): Volume status stable-volume removal with HD.  Permanent atrial fibrillation: Rate controlled with  Coreg-not a candidate for anticoagulation due to recurrent GI bleeding.  HTN: BP stable-not on any antihypertensives.  Sickle cell disease: Hemoglobin fluctuating between 7-8.   Complains of some worsening back pain (more than usual)-MRI lumbosacral spine poor study but did not show any significant abnormality.  Neurological exam is stable.  Continue as needed narcotics.  BMI: Estimated body mass index is 17.77 kg/m as calculated from the following:   Height as of this encounter: 5\' 11"  (1.803 m).   Weight as of this encounter: 57.8 kg.   Code status:   Code Status: Full Code   DVT Prophylaxis: SCDs Start: 07/31/21 2024   Family Communication: None at bedside  Disposition Plan: Status is: Inpatient Remains inpatient appropriate because: Upper GI bleeding-s/p EGD with APC of bleeding AVMs-now with persistent nausea/vomiting.  Work-up in progress.    Planned Discharge Destination:Home   Diet: Diet Order             Diet full liquid Room service appropriate? Yes; Fluid consistency: Thin  Diet effective now                     Antimicrobial agents: Anti-infectives (From admission, onward)    None        MEDICATIONS: Scheduled Meds:  (feeding supplement) PROSource Plus  30 mL Oral BID BM   allopurinol  100 mg Oral Daily   Chlorhexidine Gluconate Cloth  6 each Topical Q0600   [START ON 08/05/2021] darbepoetin (ARANESP) injection - DIALYSIS  200 mcg Intravenous Q Sat-HD   ferric citrate  420 mg Oral TID with meals   folic acid  1 mg Oral Daily   hydroxyurea  500 mg Oral Daily   pantoprazole (PROTONIX) IV  40 mg Intravenous Q12H   polyethylene glycol  17 g Oral Daily   senna-docusate  2 tablet Oral QHS   sodium chloride flush  3 mL Intravenous Q12H   Continuous Infusions:  sodium chloride     sodium chloride     sodium chloride     promethazine (PHENERGAN) injection (IM or IVPB) Stopped (08/02/21 0135)   PRN Meds:.sodium chloride, sodium chloride, acetaminophen **OR** acetaminophen, heparin, HYDROmorphone (DILAUDID) injection, lidocaine (PF), lidocaine-prilocaine, ondansetron **OR** ondansetron (ZOFRAN) IV, oxyCODONE,  pentafluoroprop-tetrafluoroeth, promethazine (PHENERGAN) injection (IM or IVPB), sodium chloride flush   I have personally reviewed following labs and imaging studies  LABORATORY DATA: CBC: Recent Labs  Lab 08/01/21 0645 08/01/21 0758 08/01/21 1503 08/01/21 2338 08/02/21 0143 08/03/21 0504 08/03/21 1838 08/04/21 0241  WBC 10.5 11.6*  --   --   --  14.8* 10.4 9.6  NEUTROABS  --   --   --   --   --  12.5*  --  7.6  HGB 7.0* 7.6*   < > 8.6* 8.2* 8.4* 8.9* 7.8*  HCT 19.2* 20.7*   < > 24.3* 23.4* 24.6* 25.7* 22.1*  MCV 92.8 91.6  --   --   --  96.5 96.3 95.7  PLT 218 223  --   --   --  253 210 208   < > = values in this interval not displayed.     Basic Metabolic Panel: Recent Labs  Lab 07/31/21 1611 07/31/21 1715 08/01/21 0645 08/01/21 0758 08/02/21 0143 08/04/21 0241  NA 129* 128* 130* 128* 136 136  K 4.2 4.0 4.3 4.9 4.1 3.1*  CL 90* 90* 89* 90* 98 99  CO2 24  --  25 23 23 27   GLUCOSE 84 84 75 72  65* 79  BUN 67* 62* 72* 75* 28* 22  CREATININE 6.16* 7.80* 6.37* 6.26* 2.93* 2.72*  CALCIUM 8.7*  --  8.7* 8.7* 8.5* 8.7*  MG 1.5*  --   --   --   --   --   PHOS  --   --   --  4.9*  --  3.3     GFR: Estimated Creatinine Clearance: 22.7 mL/min (A) (by C-G formula based on SCr of 2.72 mg/dL (H)).  Liver Function Tests: Recent Labs  Lab 07/31/21 1611 08/01/21 0758 08/04/21 0241  AST 14*  --  10*  ALT 10  --  9  ALKPHOS 237*  --  180*  BILITOT 1.4*  --  1.5*  PROT 6.5  --  5.9*  ALBUMIN <1.5* <1.5* 1.5*   1.5*    No results for input(s): LIPASE, AMYLASE in the last 168 hours. No results for input(s): AMMONIA in the last 168 hours.  Coagulation Profile: No results for input(s): INR, PROTIME in the last 168 hours.  Cardiac Enzymes: No results for input(s): CKTOTAL, CKMB, CKMBINDEX, TROPONINI in the last 168 hours.  BNP (last 3 results) No results for input(s): PROBNP in the last 8760 hours.  Lipid Profile: No results for input(s): CHOL, HDL, LDLCALC,  TRIG, CHOLHDL, LDLDIRECT in the last 72 hours.  Thyroid Function Tests: No results for input(s): TSH, T4TOTAL, FREET4, T3FREE, THYROIDAB in the last 72 hours.  Anemia Panel: No results for input(s): VITAMINB12, FOLATE, FERRITIN, TIBC, IRON, RETICCTPCT in the last 72 hours.  Urine analysis:    Component Value Date/Time   COLORURINE STRAW (A) 06/02/2018 1900   APPEARANCEUR CLEAR 06/02/2018 1900   APPEARANCEUR Clear 05/31/2017 0902   LABSPEC 1.006 06/02/2018 1900   PHURINE 7.0 06/02/2018 1900   GLUCOSEU NEGATIVE 06/02/2018 1900   HGBUR SMALL (A) 06/02/2018 1900   BILIRUBINUR neg 01/26/2020 1124   BILIRUBINUR Negative 05/31/2017 0902   KETONESUR negative 09/08/2018 1621   KETONESUR NEGATIVE 06/02/2018 1900   PROTEINUR Positive (A) 01/26/2020 1124   PROTEINUR 30 (A) 06/02/2018 1900   UROBILINOGEN 1.0 01/26/2020 1124   UROBILINOGEN 0.2 09/14/2013 1354   NITRITE neg 01/26/2020 1124   NITRITE NEGATIVE 06/02/2018 1900   LEUKOCYTESUR Negative 01/26/2020 1124   LEUKOCYTESUR Negative 05/31/2017 0902    Sepsis Labs: Lactic Acid, Venous    Component Value Date/Time   LATICACIDVEN 1.3 06/27/2021 0830    MICROBIOLOGY: Recent Results (from the past 240 hour(s))  Resp Panel by RT-PCR (Flu A&B, Covid) Nasopharyngeal Swab     Status: None   Collection Time: 07/31/21 12:14 AM   Specimen: Nasopharyngeal Swab; Nasopharyngeal(NP) swabs in vial transport medium  Result Value Ref Range Status   SARS Coronavirus 2 by RT PCR NEGATIVE NEGATIVE Final    Comment: (NOTE) SARS-CoV-2 target nucleic acids are NOT DETECTED.  The SARS-CoV-2 RNA is generally detectable in upper respiratory specimens during the acute phase of infection. The lowest concentration of SARS-CoV-2 viral copies this assay can detect is 138 copies/mL. A negative result does not preclude SARS-Cov-2 infection and should not be used as the sole basis for treatment or other patient management decisions. A negative result may  occur with  improper specimen collection/handling, submission of specimen other than nasopharyngeal swab, presence of viral mutation(s) within the areas targeted by this assay, and inadequate number of viral copies(<138 copies/mL). A negative result must be combined with clinical observations, patient history, and epidemiological information. The expected result is Negative.  Fact Sheet for Patients:  EntrepreneurPulse.com.au  Fact Sheet for Healthcare Providers:  IncredibleEmployment.be  This test is no t yet approved or cleared by the [place] FDA and  has been authorized for detection and/or diagnosis of SARS-CoV-2 by FDA under an Emergency Use Authorization (EUA). This EUA will remain  in effect (meaning this test can be used) for the duration of the COVID-19 declaration under Section 564(b)(1) of the Act, 21 U.S.C.section 360bbb-3(b)(1), unless the authorization is terminated  or revoked sooner.       Influenza A by PCR NEGATIVE NEGATIVE Final   Influenza B by PCR NEGATIVE NEGATIVE Final    Comment: (NOTE) The Xpert Xpress SARS-CoV-2/FLU/RSV plus assay is intended as an aid in the diagnosis of influenza from Nasopharyngeal swab specimens and should not be used as a sole basis for treatment. Nasal washings and aspirates are unacceptable for Xpert Xpress SARS-CoV-2/FLU/RSV testing.  Fact Sheet for Patients: EntrepreneurPulse.com.au  Fact Sheet for Healthcare Providers: IncredibleEmployment.be  This test is not yet approved or cleared by the [place] FDA and has been authorized for detection and/or diagnosis of SARS-CoV-2 by FDA under an Emergency Use Authorization (EUA). This EUA will remain in effect (meaning this test can be used) for the duration of the COVID-19 declaration under Section 564(b)(1) of the Act, 21 U.S.C. section 360bbb-3(b)(1), unless the authorization is terminated  or revoked.  Performed at [facility], [place] [address] [place], [place] [address]   MRSA Next Gen by PCR, Nasal     Status: None   Collection Time: 08/01/21  5:54 AM   Specimen: Nasal Mucosa; Nasal Swab  Result Value Ref Range Status   MRSA by PCR Next Gen NOT DETECTED NOT DETECTED Final    Comment: (NOTE) The GeneXpert MRSA Assay (FDA approved for NASAL specimens only), is one component of a comprehensive MRSA colonization surveillance program. It is not intended to diagnose MRSA infection nor to guide or monitor treatment for MRSA infections. Test performance is not FDA approved in patients less than 37 years old. Performed at [facility], [place] [address], [place], [place] [address]     RADIOLOGY STUDIES/RESULTS: MR LUMBAR SPINE WO CONTRAST  Result Date: 08/04/2021 CLINICAL DATA:  66 year old male with low back pain. Abdominal pain and ascites. EXAM: MRI LUMBAR SPINE WITHOUT CONTRAST TECHNIQUE: Multiplanar, multisequence MR imaging of the lumbar spine was performed. No intravenous contrast was administered. COMPARISON:  CT Abdomen and Pelvis 06/27/2021. FINDINGS: Unfortunately, due to the presence of ascites diagnostic MRI of the lumbar spine sub for due to poor signal to noise acquisition, despite imaging attempts on both 1.5 and 3.0 Tesla scanners. Subsequently, only sagittal T1 weighted imaging could be obtained. Lumbar segmentation appears to be normal and will be designated as such for this report. Diffusely heterogeneous T1 marrow signal throughout the visible spine and pelvis. Comparison to prior CT since last year demonstrates evidence of diffuse renal osteodystrophy with heterogeneous sclerosis. No destructive osseous lesion is identified. Grossly normal lower spinal cord and conus medullaris, which terminates at L1-L2. No evidence of lower thoracic or lumbar spinal stenosis. There is lumbar disc space loss at both L2-L3 and L5-S1, with vacuum disc there on the CT  last month. Grossly negative lumbar paraspinal soft tissues. IMPRESSION: 1. Very limited exam, truncated to only sagittal T1 weighted imaging secondary to difficulty with diagnostic lumbar MR image quality due to the presence of Ascites. 2. No lower thoracic or lumbar spinal stenosis, despite chronic L2-L3 and L5-S1 disc degeneration. 3. Heterogeneous  marrow signal throughout the visible spine and pelvis which seems to correlate to renal osteodystrophy on prior CTs. No destructive lumbar vertebral lesion. Electronically Signed   By: Genevie Ann M.D.   On: 08/04/2021 12:17     LOS: 4 days   Oren Binet, MD  [facility]  To contact the attending provider between 7A-7P or the covering provider during after hours 7P-7A, please log into the web site [email or web address] and access using universal [place] password for that web site. If you do not have the password, please call the hospital operator.  08/04/2021, 1:52 PM

## 2021-08-04 NOTE — Progress Notes (Signed)
CT abd results noted Will keep NPO Place NGT Start Zosyn Discussed with Dr Mosetta Putt coverage-he will contact General Surgery for a formal consult.

## 2021-08-05 DIAGNOSIS — D649 Anemia, unspecified: Secondary | ICD-10-CM | POA: Diagnosis not present

## 2021-08-05 LAB — RENAL FUNCTION PANEL
Albumin: 1.6 g/dL — ABNORMAL LOW (ref 3.5–5.0)
Anion gap: 15 (ref 5–15)
BUN: 34 mg/dL — ABNORMAL HIGH (ref 8–23)
CO2: 21 mmol/L — ABNORMAL LOW (ref 22–32)
Calcium: 8.8 mg/dL — ABNORMAL LOW (ref 8.9–10.3)
Chloride: 98 mmol/L (ref 98–111)
Creatinine, Ser: 3.73 mg/dL — ABNORMAL HIGH (ref 0.61–1.24)
GFR, Estimated: 17 mL/min — ABNORMAL LOW (ref >60)
Glucose, Bld: 75 mg/dL (ref 70–99)
Phosphorus: 3.9 mg/dL (ref 2.5–4.6)
Potassium: 3.8 mmol/L (ref 3.5–5.1)
Sodium: 134 mmol/L — ABNORMAL LOW (ref 135–145)

## 2021-08-05 LAB — CBC
HCT: 21.6 % — ABNORMAL LOW (ref 39.0–52.0)
Hemoglobin: 7.3 g/dL — ABNORMAL LOW (ref 13.0–17.0)
MCH: 33.3 pg (ref 26.0–34.0)
MCHC: 33.8 g/dL (ref 30.0–36.0)
MCV: 98.6 fL (ref 80.0–100.0)
Platelets: 210 10*3/uL (ref 150–400)
RBC: 2.19 MIL/uL — ABNORMAL LOW (ref 4.22–5.81)
RDW: 23.6 % — ABNORMAL HIGH (ref 11.5–15.5)
WBC: 12.5 10*3/uL — ABNORMAL HIGH (ref 4.0–10.5)
nRBC: 5.4 % — ABNORMAL HIGH (ref 0.0–0.2)

## 2021-08-05 MED ORDER — ALBUMIN HUMAN 25 % IV SOLN
12.5000 g | Freq: Once | INTRAVENOUS | Status: AC
  Administered 2021-08-05: 25 g via INTRAVENOUS
  Filled 2021-08-05: qty 50

## 2021-08-05 MED ORDER — PIPERACILLIN-TAZOBACTAM IN DEX 2-0.25 GM/50ML IV SOLN
2.2500 g | Freq: Three times a day (TID) | INTRAVENOUS | Status: DC
  Administered 2021-08-05 – 2021-08-09 (×11): 2.25 g via INTRAVENOUS
  Filled 2021-08-05 (×15): qty 50

## 2021-08-05 NOTE — Progress Notes (Signed)
PROGRESS NOTE        PATIENT DETAILS Name: Patrick [name] Age: 66 y.o. Sex: male Date of Birth: [birth year] Admit Date: 07/31/2021 Admitting Physician Eben Burow, MD [id number], Asencion Partridge, FNP  Brief Summary: 66 year old male with history of ESRD on HD, sickle cell disease, permanent atrial fibrillation, history of UGI due to AVMs, recent history (December 2022) of choledocholithiasis and ischemic small bowel requiring cholecystectomy/biliary stent placement/bowel resection-referred to the ED from HD center for hemoglobin of 5.8-subsequent EGD on 2/15 showed bleeding AVMs.  Hospital course complicated by persistent nausea and vomiting-see below for further details.  Significant events: 2/13>> referred to the ED from HD center for evaluation of hemoglobin of 5.8.   2/17>> ?SBO/Abscess on CT abdomen/pelvis -General surgery consulted  Significant imaging studies: 2/17>> MRI LS spine: Limited exams-no acute abnormalities. 2/17>> CT abd/pelvis: Questionable SBO and abcess  Significant microbiology data: 2/13>> COVID/influenza PCR: Negative  Procedures: 2/15>> EGD: Multiple bleeding angiectasia's in the stomach treated with argon plasma coagulation.  Consults: GI, nephrology, Gen Sx  Subjective: No acute issues or events overnight, tolerating dialysis well plan for IR evaluation later today  Objective: Vitals: Blood pressure 94/65, pulse 89, temperature 97.6 F (36.4 C), temperature source Oral, resp. rate 18, height 5\' 11"  (1.803 m), weight 59 kg, SpO2 99 %.   Exam: Gen Exam:Alert awake-not in any distress HEENT:atraumatic, normocephalic Chest: B/L clear to auscultation anteriorly CVS:S1S2 regular Abdomen: Soft-minimally tender in the mid abdominal area-midline dressing in place-midline incision without any signs of infection.  Dressing is not soaked. Extremities:no edema Neurology: Non focal exam-easily moving legs off the bed against  gravity and against resistance.  No gross sensory deficits. Skin: no rash   Pertinent Labs/Radiology: CBC Latest Ref Rng & Units 08/04/2021 08/04/2021 08/03/2021  WBC 4.0 - 10.5 K/uL 12.8(H) 9.6 10.4  Hemoglobin 13.0 - 17.0 g/dL 9.2(L) 7.8(L) 8.9(L)  Hematocrit 39.0 - 52.0 % 27.3(L) 22.1(L) 25.7(L)  Platelets 150 - 400 K/uL PLATELET CLUMPS NOTED ON SMEAR, UNABLE TO ESTIMATE 208 210    Lab Results  Component Value Date   NA 134 (L) 08/05/2021   K 3.8 08/05/2021   CL 98 08/05/2021   CO2 21 (L) 08/05/2021      Assessment/Plan:  Upper GI bleeding with acute blood loss anemia:  - EGD on 2/15 showed bleeding AVMs-hemoglobin has stabilized after 3 units of PRBC transfusion so far.   -Hemoglobin labile follow CBC  Questionable SBO/Abdominal fluid collection/abscess -General surgery evaluated recommending IR for drainage of fluid collection to further evaluate for infection versus fluid collection given previous GI procedures concerning for biliary leak  Nausea/vomiting improving:  -Improving somewhat, follow above  Recent history of choledocholithiasis-s/p ERCP with pancreatic/biliary stents (removed) followed by open cholecystectomy and resection of ischemic small bowel on 05/24/2021 -Stable, follow clinically -Follow fluid drainage as above to ensure no biliary leak  ESRD: On HD per nephrology.  Chronic HFrEF (EF 40-45% by echo December 2022):  -Volume status stable-volume removal with HD.  Permanent atrial fibrillation:  -Rate controlled with Coreg-not a candidate for anticoagulation due to recurrent GI bleeding.  HTN: BP stable-not on any antihypertensives.  Sickle cell disease: Hemoglobin fluctuating between 7-8.  BMI: Estimated body mass index is 18.14 kg/m as calculated from the following:   Height as of this encounter: 5\' 11"  (1.803 m).   Weight as of  this encounter: 59 kg.   Code status:   Code Status: Full Code   DVT Prophylaxis: SCDs Start: 07/31/21 2024    Family Communication: None at bedside  Disposition Plan: Status is: Inpatient Remains inpatient appropriate because: Upper GI bleeding-s/p EGD with APC of bleeding AVMs-now with persistent nausea/vomiting.  Work-up in progress.    Planned Discharge Destination:Home   Diet: Diet Order             Diet NPO time specified  Diet effective now                     Antimicrobial agents: Anti-infectives (From admission, onward)    Start     Dose/Rate Route Frequency Ordered Stop   08/04/21 2200  piperacillin-tazobactam (ZOSYN) IVPB 3.375 g        3.375 g 12.5 mL/hr over 240 Minutes Intravenous Every 8 hours 08/04/21 1927          MEDICATIONS: Scheduled Meds:  (feeding supplement) PROSource Plus  30 mL Oral BID BM   allopurinol  100 mg Oral Daily   Chlorhexidine Gluconate Cloth  6 each Topical Q0600   darbepoetin (ARANESP) injection - DIALYSIS  200 mcg Intravenous Q Sat-HD   ferric citrate  420 mg Oral TID with meals   folic acid  1 mg Oral Daily   hydroxyurea  500 mg Oral Daily   pantoprazole (PROTONIX) IV  40 mg Intravenous Q12H   polyethylene glycol  17 g Oral Daily   senna-docusate  2 tablet Oral QHS   sodium chloride flush  3 mL Intravenous Q12H   Continuous Infusions:  sodium chloride     sodium chloride     sodium chloride     piperacillin-tazobactam (ZOSYN)  IV 3.375 g (08/04/21 2340)   promethazine (PHENERGAN) injection (IM or IVPB) Stopped (08/02/21 0135)   PRN Meds:.sodium chloride, sodium chloride, acetaminophen **OR** acetaminophen, heparin, HYDROmorphone (DILAUDID) injection, lidocaine (PF), lidocaine-prilocaine, ondansetron **OR** ondansetron (ZOFRAN) IV, oxyCODONE, pentafluoroprop-tetrafluoroeth, promethazine (PHENERGAN) injection (IM or IVPB), sodium chloride flush   I have personally reviewed following labs and imaging studies  LABORATORY DATA: CBC: Recent Labs  Lab 08/01/21 0758 08/01/21 1503 08/02/21 0143 08/03/21 0504 08/03/21 1838  08/04/21 0241 08/04/21 1649  WBC 11.6*  --   --  14.8* 10.4 9.6 12.8*  NEUTROABS  --   --   --  12.5*  --  7.6  --   HGB 7.6*   < > 8.2* 8.4* 8.9* 7.8* 9.2*  HCT 20.7*   < > 23.4* 24.6* 25.7* 22.1* 27.3*  MCV 91.6  --   --  96.5 96.3 95.7 99.3  PLT 223  --   --  253 210 208 PLATELET CLUMPS NOTED ON SMEAR, UNABLE TO ESTIMATE   < > = values in this interval not displayed.     Basic Metabolic Panel: Recent Labs  Lab 07/31/21 1611 07/31/21 1715 08/01/21 0645 08/01/21 0758 08/02/21 0143 08/04/21 0241 08/05/21 0345  NA 129*   < > 130* 128* 136 136 134*  K 4.2   < > 4.3 4.9 4.1 3.1* 3.8  CL 90*   < > 89* 90* 98 99 98  CO2 24  --  25 23 23 27  21*  GLUCOSE 84   < > 75 72 65* 79 75  BUN 67*   < > 72* 75* 28* 22 34*  CREATININE 6.16*   < > 6.37* 6.26* 2.93* 2.72* 3.73*  CALCIUM 8.7*  --  8.7* 8.7*  8.5* 8.7* 8.8*  MG 1.5*  --   --   --   --   --   --   PHOS  --   --   --  4.9*  --  3.3 3.9   < > = values in this interval not displayed.     GFR: Estimated Creatinine Clearance: 16.9 mL/min (A) (by C-G formula based on SCr of 3.73 mg/dL (H)).  Liver Function Tests: Recent Labs  Lab 07/31/21 1611 08/01/21 0758 08/04/21 0241 08/05/21 0345  AST 14*  --  10*  --   ALT 10  --  9  --   ALKPHOS 237*  --  180*  --   BILITOT 1.4*  --  1.5*  --   PROT 6.5  --  5.9*  --   ALBUMIN <1.5* <1.5* 1.5*   1.5* 1.6*    No results for input(s): LIPASE, AMYLASE in the last 168 hours. No results for input(s): AMMONIA in the last 168 hours.  Coagulation Profile: No results for input(s): INR, PROTIME in the last 168 hours.  Cardiac Enzymes: No results for input(s): CKTOTAL, CKMB, CKMBINDEX, TROPONINI in the last 168 hours.  BNP (last 3 results) No results for input(s): PROBNP in the last 8760 hours.  Lipid Profile: No results for input(s): CHOL, HDL, LDLCALC, TRIG, CHOLHDL, LDLDIRECT in the last 72 hours.  Thyroid Function Tests: No results for input(s): TSH, T4TOTAL, FREET4, T3FREE,  THYROIDAB in the last 72 hours.  Anemia Panel: No results for input(s): VITAMINB12, FOLATE, FERRITIN, TIBC, IRON, RETICCTPCT in the last 72 hours.  Urine analysis:    Component Value Date/Time   COLORURINE STRAW (A) 06/02/2018 1900   APPEARANCEUR CLEAR 06/02/2018 1900   APPEARANCEUR Clear 05/31/2017 0902   LABSPEC 1.006 06/02/2018 1900   PHURINE 7.0 06/02/2018 1900   GLUCOSEU NEGATIVE 06/02/2018 1900   HGBUR SMALL (A) 06/02/2018 1900   BILIRUBINUR neg 01/26/2020 1124   BILIRUBINUR Negative 05/31/2017 0902   KETONESUR negative 09/08/2018 1621   KETONESUR NEGATIVE 06/02/2018 1900   PROTEINUR Positive (A) 01/26/2020 1124   PROTEINUR 30 (A) 06/02/2018 1900   UROBILINOGEN 1.0 01/26/2020 1124   UROBILINOGEN 0.2 09/14/2013 1354   NITRITE neg 01/26/2020 1124   NITRITE NEGATIVE 06/02/2018 1900   LEUKOCYTESUR Negative 01/26/2020 1124   LEUKOCYTESUR Negative 05/31/2017 0902    Sepsis Labs: Lactic Acid, Venous    Component Value Date/Time   LATICACIDVEN 1.3 06/27/2021 0830    MICROBIOLOGY: Recent Results (from the past 240 hour(s))  Resp Panel by RT-PCR (Flu A&B, Covid) Nasopharyngeal Swab     Status: None   Collection Time: 07/31/21 12:14 AM   Specimen: Nasopharyngeal Swab; Nasopharyngeal(NP) swabs in vial transport medium  Result Value Ref Range Status   SARS Coronavirus 2 by RT PCR NEGATIVE NEGATIVE Final    Comment: (NOTE) SARS-CoV-2 target nucleic acids are NOT DETECTED.  The SARS-CoV-2 RNA is generally detectable in upper respiratory specimens during the acute phase of infection. The lowest concentration of SARS-CoV-2 viral copies this assay can detect is 138 copies/mL. A negative result does not preclude SARS-Cov-2 infection and should not be used as the sole basis for treatment or other patient management decisions. A negative result may occur with  improper specimen collection/handling, submission of specimen other than nasopharyngeal swab, presence of viral  mutation(s) within the areas targeted by this assay, and inadequate number of viral copies(<138 copies/mL). A negative result must be combined with clinical observations, patient history, and epidemiological information. The expected  result is Negative.  Fact Sheet for Patients:  EntrepreneurPulse.com.au  Fact Sheet for Healthcare Providers:  IncredibleEmployment.be  This test is no t yet approved or cleared by the [place] FDA and  has been authorized for detection and/or diagnosis of SARS-CoV-2 by FDA under an Emergency Use Authorization (EUA). This EUA will remain  in effect (meaning this test can be used) for the duration of the COVID-19 declaration under Section 564(b)(1) of the Act, 21 U.S.C.section 360bbb-3(b)(1), unless the authorization is terminated  or revoked sooner.       Influenza A by PCR NEGATIVE NEGATIVE Final   Influenza B by PCR NEGATIVE NEGATIVE Final    Comment: (NOTE) The Xpert Xpress SARS-CoV-2/FLU/RSV plus assay is intended as an aid in the diagnosis of influenza from Nasopharyngeal swab specimens and should not be used as a sole basis for treatment. Nasal washings and aspirates are unacceptable for Xpert Xpress SARS-CoV-2/FLU/RSV testing.  Fact Sheet for Patients: EntrepreneurPulse.com.au  Fact Sheet for Healthcare Providers: IncredibleEmployment.be  This test is not yet approved or cleared by the [place] FDA and has been authorized for detection and/or diagnosis of SARS-CoV-2 by FDA under an Emergency Use Authorization (EUA). This EUA will remain in effect (meaning this test can be used) for the duration of the COVID-19 declaration under Section 564(b)(1) of the Act, 21 U.S.C. section 360bbb-3(b)(1), unless the authorization is terminated or revoked.  Performed at [facility], [place] [address], [place], [place] [address]   MRSA Next Gen by PCR, Nasal      Status: None   Collection Time: 08/01/21  5:54 AM   Specimen: Nasal Mucosa; Nasal Swab  Result Value Ref Range Status   MRSA by PCR Next Gen NOT DETECTED NOT DETECTED Final    Comment: (NOTE) The GeneXpert MRSA Assay (FDA approved for NASAL specimens only), is one component of a comprehensive MRSA colonization surveillance program. It is not intended to diagnose MRSA infection nor to guide or monitor treatment for MRSA infections. Test performance is not FDA approved in patients less than 59 years old. Performed at [facility], [place] [address], [place], [place] [address]     RADIOLOGY STUDIES/RESULTS: DG Abd 1 View  Result Date: 08/04/2021 CLINICAL DATA:  NG tube placement. EXAM: ABDOMEN - 1 VIEW COMPARISON:  Abdominopelvic CT earlier today. FINDINGS: Tip of the enteric tube is below the diaphragm, the side port is in the region of the gastroesophageal junction. Recommend advancement of approximately 7 cm to place the side-port below the diaphragm. Mild gaseous gastric distension. IMPRESSION: Tip of the enteric tube below the diaphragm in the stomach, side-port in the region of the gastroesophageal junction. Recommend advancement of approximately 7 cm to place the side-port below the diaphragm. Electronically Signed   By: Keith Rake M.D.   On: 08/04/2021 21:05   MR LUMBAR SPINE WO CONTRAST  Result Date: 08/04/2021 CLINICAL DATA:  66 year old male with low back pain. Abdominal pain and ascites. EXAM: MRI LUMBAR SPINE WITHOUT CONTRAST TECHNIQUE: Multiplanar, multisequence MR imaging of the lumbar spine was performed. No intravenous contrast was administered. COMPARISON:  CT Abdomen and Pelvis 06/27/2021. FINDINGS: Unfortunately, due to the presence of ascites diagnostic MRI of the lumbar spine sub for due to poor signal to noise acquisition, despite imaging attempts on both 1.5 and 3.0 Tesla scanners. Subsequently, only sagittal T1 weighted imaging could be obtained. Lumbar  segmentation appears to be normal and will be designated as such for this report. Diffusely heterogeneous T1  marrow signal throughout the visible spine and pelvis. Comparison to prior CT since last year demonstrates evidence of diffuse renal osteodystrophy with heterogeneous sclerosis. No destructive osseous lesion is identified. Grossly normal lower spinal cord and conus medullaris, which terminates at L1-L2. No evidence of lower thoracic or lumbar spinal stenosis. There is lumbar disc space loss at both L2-L3 and L5-S1, with vacuum disc there on the CT last month. Grossly negative lumbar paraspinal soft tissues. IMPRESSION: 1. Very limited exam, truncated to only sagittal T1 weighted imaging secondary to difficulty with diagnostic lumbar MR image quality due to the presence of Ascites. 2. No lower thoracic or lumbar spinal stenosis, despite chronic L2-L3 and L5-S1 disc degeneration. 3. Heterogeneous marrow signal throughout the visible spine and pelvis which seems to correlate to renal osteodystrophy on prior CTs. No destructive lumbar vertebral lesion. Electronically Signed   By: Genevie Ann M.D.   On: 08/04/2021 12:17   CT ABDOMEN PELVIS W CONTRAST  Result Date: 08/04/2021 CLINICAL DATA:  Nausea and vomiting. EXAM: CT ABDOMEN AND PELVIS WITH CONTRAST TECHNIQUE: Multidetector CT imaging of the abdomen and pelvis was performed using the standard protocol following bolus administration of intravenous contrast. RADIATION DOSE REDUCTION: This exam was performed according to the departmental dose-optimization program which includes automated exposure control, adjustment of the mA and/or kV according to patient size and/or use of iterative reconstruction technique. CONTRAST:  161mL OMNIPAQUE IOHEXOL 350 MG/ML SOLN COMPARISON:  Abdominopelvic CT 06/27/2021 FINDINGS: Lower chest: Multi chamber cardiomegaly. Trace left pleural effusion and basilar atelectasis. Patient is leaning to the left. Hepatobiliary: No focal liver  lesion. Mild hypertrophy of the left hepatic lobe. No convincing capsular nodularity. Pneumobilia with air in the common bile duct and central intrahepatic biliary tree. Distal common bile duct measures 10 mm. Cholecystectomy. Pancreas: No peripancreatic fat stranding. Prominent proximal pancreatic duct at 4 mm. No obvious pancreatic mass. Spleen: Atrophic and calcified. Adrenals/Urinary Tract: No adrenal nodule. No hydronephrosis. There are multiple bilateral renal cysts. Mild bilateral renal parenchymal atrophy. There is absent renal excretion on delayed phase imaging. Thick-walled urinary bladder. Stomach/Bowel: Detailed bowel assessment is limited in the absence of enteric contrast, paucity of abdominal fat, and presence of abdominopelvic ascites. The stomach is dilated and fluid-filled. Clips are seen within the anterior gastric lumen. Dependent hyperdensity in the stomach likely ingested pills. The duodenum and proximal small bowel are dilated and fluid-filled there are intermittently areas of decompressed small bowel with wall thickening in the region of small bowel dilatation. Area transition in the left mid abdomen related small bowel thickening. The more distal small bowel is decompressed. There are right lower quadrant chain sutures. Lateral to these chain sutures is a thick-walled fluid collection measuring 9.1 x 5.3 x 4.4 cm, series 3, image 58 and series 6, image 40. This contains non dependent air, with linear air tracking about the right lateral aspect into the right pelvis. This appears new from prior exam. The appendix is present in contains enteric contrast. There is wall thickening of the ascending colon. Remainder of the colon is decompressed and not well assessed on the current exam. Questionable wall thickening involving the proximal sigmoid colon. There is stool distending the rectum. Vascular/Lymphatic: Moderate aortic atherosclerosis. Moderate branch atherosclerosis. Patent portal and  splenic veins. No obvious abdominopelvic adenopathy, assessment limited by ascites and paucity of abdominal fat. Reproductive: Unremarkable prostate. Other: Abdominopelvic ascites, small to moderate in degree, diminished from prior CT. There is a thick-walled fluid collection in the anterior pelvis as described  above. Generalized edema of the mesenteric and subcutaneous fat. No pneumoperitoneum. Musculoskeletal: Heterogeneous diffuse osseous sclerosis may represent sequela of renal osteodystrophy or sickle cell. L5-S1 degenerative disc disease. No acute osseous findings are seen. IMPRESSION: 1. Findings suspicious for small-bowel obstruction with transition point in the left mid abdomen, possibly due to adhesions. 2. Thick-walled fluid collection in the anterior pelvis measuring 9.1 x 5.3 x 4.4 cm, suspicious for abscess. This is new from prior exam. Underlying etiology is indeterminate, this is adjacent to enteric sutures from prior bowel resection. 3. Pneumobilia with air in the common bile duct and central intrahepatic biliary tree, likely related to prior sphincterotomy. Mild dilatation of the distal common bile duct and proximal pancreatic duct without evidence of pancreatic mass. 4. Thick-walled urinary bladder, may be related to chronic renal disease or seen with cystitis. 5. Questionable wall thickening involving the proximal sigmoid colon. 6. Abdominopelvic ascites, small to moderate in degree, diminished from prior CT. 7. Multi chamber cardiomegaly. Trace left pleural effusion and basilar atelectasis. Aortic Atherosclerosis (ICD10-I70.0). Electronically Signed   By: Keith Rake M.D.   On: 08/04/2021 18:58     LOS: 5 days   Little Ishikawa, DO  [facility]  To contact the attending provider between 7A-7P or the covering provider during after hours 7P-7A, please log into the web site [email or web address] and access using universal [place] password for that web site. If you do not have the  password, please call the hospital operator.  08/05/2021, 7:35 AM

## 2021-08-05 NOTE — Progress Notes (Signed)
Pharmacy Antibiotic Note  Patrick [name] is a 66 y.o. male admitted on 07/31/2021 with  intr-abdominal infection .  Pharmacy has been consulted for Zosyn dosing. ESRD on HD TTS. WBC down to 12.5, patient afebrile.   Plan: Decrease Zosyn dose to 2.25g IV q8h for HD dosing  Height: 5\' 11"  (180.3 cm) Weight: 59 kg (130 lb 1.1 oz) IBW/kg (Calculated) : 75.3  Temp (24hrs), Avg:97.6 F (36.4 C), Min:96.6 F (35.9 C), Max:98.3 F (36.8 C)  Recent Labs  Lab 08/01/21 0645 08/01/21 0758 08/02/21 0143 08/03/21 0504 08/03/21 1838 08/04/21 0241 08/04/21 1649 08/05/21 0345 08/05/21 0942  WBC 10.5 11.6*  --  14.8* 10.4 9.6 12.8*  --  12.5*  CREATININE 6.37* 6.26* 2.93*  --   --  2.72*  --  3.73*  --      Estimated Creatinine Clearance: 16.9 mL/min (A) (by C-G formula based on SCr of 3.73 mg/dL (H)).    No Known Allergies  Antimicrobials this admission: 2/17 Zosyn >>      Microbiology results: 2/14 MRSA PCR: negative  Thank you for allowing pharmacy to be a part of this patients care.  Donald Pore, PharmD Pharmacy Resident 08/05/2021, 3:07 PM

## 2021-08-05 NOTE — Progress Notes (Signed)
[facility] Progress Note   Subjective:   Seen prior to HD. Noted abdominal findings. NG tube to suction. Pt reports a lot of back pain this AM but denies abdominal pain. No SOB, CP, palpitations, dizziness.   Objective Vitals:   08/04/21 2302 08/05/21 0130 08/05/21 0302 08/05/21 0855  BP: 96/65  94/65 106/67  Pulse: 89   85  Resp: 18  18 18   Temp: 98.3 F (36.8 C)  97.6 F (36.4 C) 97.6 F (36.4 C)  TempSrc: Oral  Oral Oral  SpO2:    100%  Weight:  59 kg    Height:       Physical Exam General: Ill appearing male, NG tube in place. Alert Heart: RRR, no murmurs, rubs or gallops Lungs: CTA bilaterally without wheezing, rhonchi or rales Abdomen: Non-distended, +BS Extremities: No edema b/l lower extremities Dialysis Access:  RUE AVF +T/b  Additional Objective Labs: Basic Metabolic Panel: Recent Labs  Lab 08/01/21 0758 08/02/21 0143 08/04/21 0241 08/05/21 0345  NA 128* 136 136 134*  K 4.9 4.1 3.1* 3.8  CL 90* 98 99 98  CO2 23 23 27  21*  GLUCOSE 72 65* 79 75  BUN 75* 28* 22 34*  CREATININE 6.26* 2.93* 2.72* 3.73*  CALCIUM 8.7* 8.5* 8.7* 8.8*  PHOS 4.9*  --  3.3 3.9   Liver Function Tests: Recent Labs  Lab 07/31/21 1611 08/01/21 0758 08/04/21 0241 08/05/21 0345  AST 14*  --  10*  --   ALT 10  --  9  --   ALKPHOS 237*  --  180*  --   BILITOT 1.4*  --  1.5*  --   PROT 6.5  --  5.9*  --   ALBUMIN <1.5* <1.5* 1.5*   1.5* 1.6*   No results for input(s): LIPASE, AMYLASE in the last 168 hours. CBC: Recent Labs  Lab 08/01/21 0758 08/01/21 1503 08/03/21 0504 08/03/21 1838 08/04/21 0241 08/04/21 1649  WBC 11.6*  --  14.8* 10.4 9.6 12.8*  NEUTROABS  --   --  12.5*  --  7.6  --   HGB 7.6*   < > 8.4* 8.9* 7.8* 9.2*  HCT 20.7*   < > 24.6* 25.7* 22.1* 27.3*  MCV 91.6  --  96.5 96.3 95.7 99.3  PLT 223  --  253 210 208 PLATELET CLUMPS NOTED ON SMEAR, UNABLE TO ESTIMATE   < > = values in this interval not displayed.   Blood Culture     Component Value Date/Time   SDES BLOOD LEFT WRIST 06/27/2021 0835   SPECREQUEST  06/27/2021 0835    BOTTLES DRAWN AEROBIC AND ANAEROBIC Blood Culture adequate volume   CULT  06/27/2021 0835    NO GROWTH 5 DAYS Performed at [facility], [place] [address], [place], [place] [id number]    REPTSTATUS 07/02/2021 FINAL 06/27/2021 0835    Cardiac Enzymes: No results for input(s): CKTOTAL, CKMB, CKMBINDEX, TROPONINI in the last 168 hours. CBG: No results for input(s): GLUCAP in the last 168 hours. Iron Studies: No results for input(s): IRON, TIBC, TRANSFERRIN, FERRITIN in the last 72 hours. @lablastinr3 @ Studies/Results: DG Abd 1 View  Result Date: 08/04/2021 CLINICAL DATA:  NG tube placement. EXAM: ABDOMEN - 1 VIEW COMPARISON:  Abdominopelvic CT earlier today. FINDINGS: Tip of the enteric tube is below the diaphragm, the side port is in the region of the gastroesophageal junction. Recommend advancement of approximately 7 cm to place the side-port below the diaphragm. Mild gaseous gastric  distension. IMPRESSION: Tip of the enteric tube below the diaphragm in the stomach, side-port in the region of the gastroesophageal junction. Recommend advancement of approximately 7 cm to place the side-port below the diaphragm. Electronically Signed   By: Keith Rake M.D.   On: 08/04/2021 21:05   MR LUMBAR SPINE WO CONTRAST  Result Date: 08/04/2021 CLINICAL DATA:  66 year old male with low back pain. Abdominal pain and ascites. EXAM: MRI LUMBAR SPINE WITHOUT CONTRAST TECHNIQUE: Multiplanar, multisequence MR imaging of the lumbar spine was performed. No intravenous contrast was administered. COMPARISON:  CT Abdomen and Pelvis 06/27/2021. FINDINGS: Unfortunately, due to the presence of ascites diagnostic MRI of the lumbar spine sub for due to poor signal to noise acquisition, despite imaging attempts on both 1.5 and 3.0 Tesla scanners. Subsequently, only sagittal T1 weighted imaging could be obtained.  Lumbar segmentation appears to be normal and will be designated as such for this report. Diffusely heterogeneous T1 marrow signal throughout the visible spine and pelvis. Comparison to prior CT since last year demonstrates evidence of diffuse renal osteodystrophy with heterogeneous sclerosis. No destructive osseous lesion is identified. Grossly normal lower spinal cord and conus medullaris, which terminates at L1-L2. No evidence of lower thoracic or lumbar spinal stenosis. There is lumbar disc space loss at both L2-L3 and L5-S1, with vacuum disc there on the CT last month. Grossly negative lumbar paraspinal soft tissues. IMPRESSION: 1. Very limited exam, truncated to only sagittal T1 weighted imaging secondary to difficulty with diagnostic lumbar MR image quality due to the presence of Ascites. 2. No lower thoracic or lumbar spinal stenosis, despite chronic L2-L3 and L5-S1 disc degeneration. 3. Heterogeneous marrow signal throughout the visible spine and pelvis which seems to correlate to renal osteodystrophy on prior CTs. No destructive lumbar vertebral lesion. Electronically Signed   By: Genevie Ann M.D.   On: 08/04/2021 12:17   CT ABDOMEN PELVIS W CONTRAST  Result Date: 08/04/2021 CLINICAL DATA:  Nausea and vomiting. EXAM: CT ABDOMEN AND PELVIS WITH CONTRAST TECHNIQUE: Multidetector CT imaging of the abdomen and pelvis was performed using the standard protocol following bolus administration of intravenous contrast. RADIATION DOSE REDUCTION: This exam was performed according to the departmental dose-optimization program which includes automated exposure control, adjustment of the mA and/or kV according to patient size and/or use of iterative reconstruction technique. CONTRAST:  140mL OMNIPAQUE IOHEXOL 350 MG/ML SOLN COMPARISON:  Abdominopelvic CT 06/27/2021 FINDINGS: Lower chest: Multi chamber cardiomegaly. Trace left pleural effusion and basilar atelectasis. Patient is leaning to the left. Hepatobiliary: No focal  liver lesion. Mild hypertrophy of the left hepatic lobe. No convincing capsular nodularity. Pneumobilia with air in the common bile duct and central intrahepatic biliary tree. Distal common bile duct measures 10 mm. Cholecystectomy. Pancreas: No peripancreatic fat stranding. Prominent proximal pancreatic duct at 4 mm. No obvious pancreatic mass. Spleen: Atrophic and calcified. Adrenals/Urinary Tract: No adrenal nodule. No hydronephrosis. There are multiple bilateral renal cysts. Mild bilateral renal parenchymal atrophy. There is absent renal excretion on delayed phase imaging. Thick-walled urinary bladder. Stomach/Bowel: Detailed bowel assessment is limited in the absence of enteric contrast, paucity of abdominal fat, and presence of abdominopelvic ascites. The stomach is dilated and fluid-filled. Clips are seen within the anterior gastric lumen. Dependent hyperdensity in the stomach likely ingested pills. The duodenum and proximal small bowel are dilated and fluid-filled there are intermittently areas of decompressed small bowel with wall thickening in the region of small bowel dilatation. Area transition in the left mid abdomen related small bowel  thickening. The more distal small bowel is decompressed. There are right lower quadrant chain sutures. Lateral to these chain sutures is a thick-walled fluid collection measuring 9.1 x 5.3 x 4.4 cm, series 3, image 58 and series 6, image 40. This contains non dependent air, with linear air tracking about the right lateral aspect into the right pelvis. This appears new from prior exam. The appendix is present in contains enteric contrast. There is wall thickening of the ascending colon. Remainder of the colon is decompressed and not well assessed on the current exam. Questionable wall thickening involving the proximal sigmoid colon. There is stool distending the rectum. Vascular/Lymphatic: Moderate aortic atherosclerosis. Moderate branch atherosclerosis. Patent portal and  splenic veins. No obvious abdominopelvic adenopathy, assessment limited by ascites and paucity of abdominal fat. Reproductive: Unremarkable prostate. Other: Abdominopelvic ascites, small to moderate in degree, diminished from prior CT. There is a thick-walled fluid collection in the anterior pelvis as described above. Generalized edema of the mesenteric and subcutaneous fat. No pneumoperitoneum. Musculoskeletal: Heterogeneous diffuse osseous sclerosis may represent sequela of renal osteodystrophy or sickle cell. L5-S1 degenerative disc disease. No acute osseous findings are seen. IMPRESSION: 1. Findings suspicious for small-bowel obstruction with transition point in the left mid abdomen, possibly due to adhesions. 2. Thick-walled fluid collection in the anterior pelvis measuring 9.1 x 5.3 x 4.4 cm, suspicious for abscess. This is new from prior exam. Underlying etiology is indeterminate, this is adjacent to enteric sutures from prior bowel resection. 3. Pneumobilia with air in the common bile duct and central intrahepatic biliary tree, likely related to prior sphincterotomy. Mild dilatation of the distal common bile duct and proximal pancreatic duct without evidence of pancreatic mass. 4. Thick-walled urinary bladder, may be related to chronic renal disease or seen with cystitis. 5. Questionable wall thickening involving the proximal sigmoid colon. 6. Abdominopelvic ascites, small to moderate in degree, diminished from prior CT. 7. Multi chamber cardiomegaly. Trace left pleural effusion and basilar atelectasis. Aortic Atherosclerosis (ICD10-I70.0). Electronically Signed   By: Keith Rake M.D.   On: 08/04/2021 18:58   Medications:  sodium chloride     sodium chloride     sodium chloride     albumin human     piperacillin-tazobactam (ZOSYN)  IV 3.375 g (08/04/21 2340)   promethazine (PHENERGAN) injection (IM or IVPB) Stopped (08/02/21 0135)    (feeding supplement) PROSource Plus  30 mL Oral BID BM    allopurinol  100 mg Oral Daily   Chlorhexidine Gluconate Cloth  6 each Topical Q0600   darbepoetin (ARANESP) injection - DIALYSIS  200 mcg Intravenous Q Sat-HD   ferric citrate  420 mg Oral TID with meals   folic acid  1 mg Oral Daily   hydroxyurea  500 mg Oral Daily   pantoprazole (PROTONIX) IV  40 mg Intravenous Q12H   polyethylene glycol  17 g Oral Daily   senna-docusate  2 tablet Oral QHS   sodium chloride flush  3 mL Intravenous Q12H    Dialysis Orders: TTS - AF  4hrs, BFR 400, DFR 500,  EDW 55.5kg, 3K/ 2Ca UFP2   Access: RU AVF  Heparin none Mircera 225 mcg q2wks - last 07/20/21 Hectorol 21mcg IV qHD   Sensipar 30mg  qHD  Assessment/Plan:  Symptomatic acute on chronic anemia of CKD - Hx recurrent GIB. EGD planned for today.  Hgb 5.2 on admit s/p 2 unit pRBC improved to 7.6, then drop to 6.6 post HD.  Given additional 1 unit pRBC. Hgb 9.2 today.  On PPI BID.   Due for ESA, will order with next HD  ESRD -  on TTS.  Continue regular schedule.  Hyponatremia -  improved. Hypokalemia: Resolved, using higher K bath with HD  Hypotension/volume  -BP runs soft.  Avoiding midodrine with prolonged QT. Does not appear volume overloaded.  UF goal reduced today.   Secondary Hyperparathyroidism -  Calcium and phos in goal.  Continue sensipar, binders once tolerating PO intake  Severe protein calorie malnutrition - Currently NPO. renal diet w/fluid restrictions when advanced.  Alb <1.5, give protein supplements.  A fib - not on eliquis d/t GIB SBO and abdominal abscess - per PMD Sickle cell anemia - chronic, having back pain this admission    Anice Paganini, PA-C 08/05/2021, 9:33 AM  [facility] Pager: [phone]

## 2021-08-05 NOTE — Progress Notes (Signed)
Returned to the floor from HD.  Complaining of pain and discomfort.  Medicated the pt, cleaned him up, changed the linens and pt is resting.  Sleeping at present

## 2021-08-05 NOTE — Progress Notes (Signed)
3 Days Post-Op  Subjective: This is a patient well known to our service as he underwent an ex lap with SBR and cholecystectomy for gangrenous cholecystitis and chronic ischemic segment of SB causing an obstruction by Dr. Donne Hazel on 05/24/21.  He has been recovering from his surgery fairly well, but did get readmitted in January secondary to an UGI bleed secondary to hemorrhagic gastritis which resolved after Eliquis discontinued.  He was started on a PPI and has been on this.  He has followed up with GI on 07/19/21.  He states last Saturday he began having some abdominal discomfort as well as N/V.  He had otherwise been eating well before this.  His last BM was here after a suppository, but hasn't passed anything else.  He continued at home for several days with the N/V.  He presented to the ED from HD for a hgb of 5.2.  he was seen by GI who proceeded with an EGD.  He was found to have multiple bleed angioectasias in the stomach.  He was treated with argon plasma coagulation, but also found to have a lot of fluid in the stomach.  He then underwent a CT of the A/P which revealed findings suspicious for a SBO with transition in the left mid abdomen as well as a thick walled fluid collection in the anterior pelvis measuring 9.1x5.3x4.4cm.  he has had an NGT placed with bilious out.  We have been asked to see him for further evaluation and recommendations.  ROS: See above, otherwise other systems negative  Objective: Vital signs in last 24 hours: Temp:  [97.6 F (36.4 C)-98.3 F (36.8 C)] 97.6 F (36.4 C) (02/18 0855) Pulse Rate:  [85-94] 85 (02/18 0855) Resp:  [10-20] 18 (02/18 0855) BP: (94-106)/(60-67) 106/67 (02/18 0855) SpO2:  [99 %-100 %] 100 % (02/18 0855) Weight:  [59 kg] 59 kg (02/18 0130) Last BM Date : 08/03/21  Intake/Output from previous day: 02/17 0701 - 02/18 0700 In: 44.2 [I.V.:3; IV Piggyback:41.2] Out: 500 [Emesis/NG output:500] Intake/Output this shift: No intake/output  data recorded.  PE: Gen: NAD, laying in bed Heart: regular Lungs: CTAB Abd: soft, NT, ND, midline wound almost completely healed.  Small area of scabbing still present.  NGT in place with bilious output. Ext: R arm with more edema than left but fistula present for HD with a thrill Psych: A&Ox3  Lab Results:  Recent Labs    08/04/21 0241 08/04/21 1649  WBC 9.6 12.8*  HGB 7.8* 9.2*  HCT 22.1* 27.3*  PLT 208 PLATELET CLUMPS NOTED ON SMEAR, UNABLE TO ESTIMATE   BMET Recent Labs    08/04/21 0241 08/05/21 0345  NA 136 134*  K 3.1* 3.8  CL 99 98  CO2 27 21*  GLUCOSE 79 75  BUN 22 34*  CREATININE 2.72* 3.73*  CALCIUM 8.7* 8.8*   PT/INR No results for input(s): LABPROT, INR in the last 72 hours. CMP     Component Value Date/Time   NA 134 (L) 08/05/2021 0345   NA 135 05/24/2020 1119   K 3.8 08/05/2021 0345   CL 98 08/05/2021 0345   CO2 21 (L) 08/05/2021 0345   GLUCOSE 75 08/05/2021 0345   BUN 34 (H) 08/05/2021 0345   BUN 25 05/24/2020 1119   CREATININE 3.73 (H) 08/05/2021 0345   CREATININE 2.90 (H) 08/11/2015 1359   CALCIUM 8.8 (L) 08/05/2021 0345   PROT 5.9 (L) 08/04/2021 0241   PROT 7.5 05/24/2020 1119   ALBUMIN 1.6 (  L) 08/05/2021 0345   ALBUMIN 4.1 05/24/2020 1119   AST 10 (L) 08/04/2021 0241   ALT 9 08/04/2021 0241   ALKPHOS 180 (H) 08/04/2021 0241   BILITOT 1.5 (H) 08/04/2021 0241   BILITOT 2.4 (H) 05/24/2020 1119   GFRNONAA 17 (L) 08/05/2021 0345   GFRNONAA 23 (L) 08/11/2015 1359   GFRAA 24 (L) 05/24/2020 1119   GFRAA 27 (L) 08/11/2015 1359   Lipase     Component Value Date/Time   LIPASE 30 06/27/2021 0830       Studies/Results: DG Abd 1 View  Result Date: 08/04/2021 CLINICAL DATA:  NG tube placement. EXAM: ABDOMEN - 1 VIEW COMPARISON:  Abdominopelvic CT earlier today. FINDINGS: Tip of the enteric tube is below the diaphragm, the side port is in the region of the gastroesophageal junction. Recommend advancement of approximately 7 cm to place the  side-port below the diaphragm. Mild gaseous gastric distension. IMPRESSION: Tip of the enteric tube below the diaphragm in the stomach, side-port in the region of the gastroesophageal junction. Recommend advancement of approximately 7 cm to place the side-port below the diaphragm. Electronically Signed   By: Keith Rake M.D.   On: 08/04/2021 21:05   MR LUMBAR SPINE WO CONTRAST  Result Date: 08/04/2021 CLINICAL DATA:  66 year old male with low back pain. Abdominal pain and ascites. EXAM: MRI LUMBAR SPINE WITHOUT CONTRAST TECHNIQUE: Multiplanar, multisequence MR imaging of the lumbar spine was performed. No intravenous contrast was administered. COMPARISON:  CT Abdomen and Pelvis 06/27/2021. FINDINGS: Unfortunately, due to the presence of ascites diagnostic MRI of the lumbar spine sub for due to poor signal to noise acquisition, despite imaging attempts on both 1.5 and 3.0 Tesla scanners. Subsequently, only sagittal T1 weighted imaging could be obtained. Lumbar segmentation appears to be normal and will be designated as such for this report. Diffusely heterogeneous T1 marrow signal throughout the visible spine and pelvis. Comparison to prior CT since last year demonstrates evidence of diffuse renal osteodystrophy with heterogeneous sclerosis. No destructive osseous lesion is identified. Grossly normal lower spinal cord and conus medullaris, which terminates at L1-L2. No evidence of lower thoracic or lumbar spinal stenosis. There is lumbar disc space loss at both L2-L3 and L5-S1, with vacuum disc there on the CT last month. Grossly negative lumbar paraspinal soft tissues. IMPRESSION: 1. Very limited exam, truncated to only sagittal T1 weighted imaging secondary to difficulty with diagnostic lumbar MR image quality due to the presence of Ascites. 2. No lower thoracic or lumbar spinal stenosis, despite chronic L2-L3 and L5-S1 disc degeneration. 3. Heterogeneous marrow signal throughout the visible spine and  pelvis which seems to correlate to renal osteodystrophy on prior CTs. No destructive lumbar vertebral lesion. Electronically Signed   By: Genevie Ann M.D.   On: 08/04/2021 12:17   CT ABDOMEN PELVIS W CONTRAST  Result Date: 08/04/2021 CLINICAL DATA:  Nausea and vomiting. EXAM: CT ABDOMEN AND PELVIS WITH CONTRAST TECHNIQUE: Multidetector CT imaging of the abdomen and pelvis was performed using the standard protocol following bolus administration of intravenous contrast. RADIATION DOSE REDUCTION: This exam was performed according to the departmental dose-optimization program which includes automated exposure control, adjustment of the mA and/or kV according to patient size and/or use of iterative reconstruction technique. CONTRAST:  165mL OMNIPAQUE IOHEXOL 350 MG/ML SOLN COMPARISON:  Abdominopelvic CT 06/27/2021 FINDINGS: Lower chest: Multi chamber cardiomegaly. Trace left pleural effusion and basilar atelectasis. Patient is leaning to the left. Hepatobiliary: No focal liver lesion. Mild hypertrophy of the left hepatic lobe.  No convincing capsular nodularity. Pneumobilia with air in the common bile duct and central intrahepatic biliary tree. Distal common bile duct measures 10 mm. Cholecystectomy. Pancreas: No peripancreatic fat stranding. Prominent proximal pancreatic duct at 4 mm. No obvious pancreatic mass. Spleen: Atrophic and calcified. Adrenals/Urinary Tract: No adrenal nodule. No hydronephrosis. There are multiple bilateral renal cysts. Mild bilateral renal parenchymal atrophy. There is absent renal excretion on delayed phase imaging. Thick-walled urinary bladder. Stomach/Bowel: Detailed bowel assessment is limited in the absence of enteric contrast, paucity of abdominal fat, and presence of abdominopelvic ascites. The stomach is dilated and fluid-filled. Clips are seen within the anterior gastric lumen. Dependent hyperdensity in the stomach likely ingested pills. The duodenum and proximal small bowel are  dilated and fluid-filled there are intermittently areas of decompressed small bowel with wall thickening in the region of small bowel dilatation. Area transition in the left mid abdomen related small bowel thickening. The more distal small bowel is decompressed. There are right lower quadrant chain sutures. Lateral to these chain sutures is a thick-walled fluid collection measuring 9.1 x 5.3 x 4.4 cm, series 3, image 58 and series 6, image 40. This contains non dependent air, with linear air tracking about the right lateral aspect into the right pelvis. This appears new from prior exam. The appendix is present in contains enteric contrast. There is wall thickening of the ascending colon. Remainder of the colon is decompressed and not well assessed on the current exam. Questionable wall thickening involving the proximal sigmoid colon. There is stool distending the rectum. Vascular/Lymphatic: Moderate aortic atherosclerosis. Moderate branch atherosclerosis. Patent portal and splenic veins. No obvious abdominopelvic adenopathy, assessment limited by ascites and paucity of abdominal fat. Reproductive: Unremarkable prostate. Other: Abdominopelvic ascites, small to moderate in degree, diminished from prior CT. There is a thick-walled fluid collection in the anterior pelvis as described above. Generalized edema of the mesenteric and subcutaneous fat. No pneumoperitoneum. Musculoskeletal: Heterogeneous diffuse osseous sclerosis may represent sequela of renal osteodystrophy or sickle cell. L5-S1 degenerative disc disease. No acute osseous findings are seen. IMPRESSION: 1. Findings suspicious for small-bowel obstruction with transition point in the left mid abdomen, possibly due to adhesions. 2. Thick-walled fluid collection in the anterior pelvis measuring 9.1 x 5.3 x 4.4 cm, suspicious for abscess. This is new from prior exam. Underlying etiology is indeterminate, this is adjacent to enteric sutures from prior bowel  resection. 3. Pneumobilia with air in the common bile duct and central intrahepatic biliary tree, likely related to prior sphincterotomy. Mild dilatation of the distal common bile duct and proximal pancreatic duct without evidence of pancreatic mass. 4. Thick-walled urinary bladder, may be related to chronic renal disease or seen with cystitis. 5. Questionable wall thickening involving the proximal sigmoid colon. 6. Abdominopelvic ascites, small to moderate in degree, diminished from prior CT. 7. Multi chamber cardiomegaly. Trace left pleural effusion and basilar atelectasis. Aortic Atherosclerosis (ICD10-I70.0). Electronically Signed   By: Keith Rake M.D.   On: 08/04/2021 18:58    Anti-infectives: Anti-infectives (From admission, onward)    Start     Dose/Rate Route Frequency Ordered Stop   08/04/21 2200  piperacillin-tazobactam (ZOSYN) IVPB 3.375 g        3.375 g 12.5 mL/hr over 240 Minutes Intravenous Every 8 hours 08/04/21 1927          Assessment/Plan 2 months s/p ex lap with cholecystectomy, SBR, now with recurrent SBO and intra-abdominal fluid collection The patient's imaging, vitals, labs, I/Os, progress notes, procedure notes, and consultant  notes have been reviewed.  The patient has a very thick walled fluid collection near his anastomosis.  He also has evidence of a SBO.  It is possible this fluid collection is causing some inflammation and edema near the anastomosis causing a partial obstruction.  We will ask IR to drain this collection and continue present management for his SBO.  Agree with IV abx therapy  Hopefully as stated above, once the abscess is drained that his obstruction will resolve.  May need the SBO protocol, but would not do that yet until fluid collection addressed.  We will continue to follow with you.  FEN - NPO/NGT/IVFs VTE - on hold due to UGI bleed ID - zosyn  UGI bleed, see above ESRD on HD CHF Severe MR, TR A fib HTN Sickle Cell disease Anemia    LOS: 5 days    Henreitta Cea , [facility] 08/05/2021, 9:24 AM Please see Amion for pager number during day hours 7:00am-4:30pm or 7:00am -11:30am on weekends

## 2021-08-05 NOTE — Plan of Care (Signed)

## 2021-08-05 NOTE — Progress Notes (Signed)
Pt left floor to HD.  Called and told Patrick [name] to make sure his nurse connects him back to the NG suction.  She verbalized understanding and said she will make sure to tell the nurse

## 2021-08-05 NOTE — Consult Note (Signed)
Chief Complaint: Patient was seen in consultation today for pelvic fluid collection   Referring Physician(s): Saverio Danker, PA-C  Supervising Physician: Markus Daft  Patient Status: [facility] - In-pt  History of Present Illness: Patrick [name] is a 66 y.o. male with a medical history significant for CHF, ESRD on hemodialysis, sickle cell disease and gangrenous cholecystitis s/p exploratory laparotomy with small bowel resection and cholecystectomy 05/24/21. He was re-admitted to the hospital January 2023 for an upper GI bleed secondary to hemorrhagic gastritis but this resolved after discontinuing his eliquis.   Last Saturday the patient developed abdominal discomfort, nausea and vomiting. He was sent to the ED 07/31/21 from the dialysis center for a hemoglobin of 5.2. GI performed an EGD which showed multiple bleeding angioectasias in the stomach and these were treated with argon plasma coagulation. He was also noted to have fluid in the stomach. CT imaging showed findings suspicious for small bowel obstruction and an anterior pelvis fluid collection.   CT abdomen/pelvis with contrast 08/04/21 IMPRESSION: 1. Findings suspicious for small-bowel obstruction with transition point in the left mid abdomen, possibly due to adhesions. 2. Thick-walled fluid collection in the anterior pelvis measuring 9.1 x 5.3 x 4.4 cm, suspicious for abscess. This is new from prior exam. Underlying etiology is indeterminate, this is adjacent to enteric sutures from prior bowel resection. 3. Pneumobilia with air in the common bile duct and central intrahepatic biliary tree, likely related to prior sphincterotomy. Mild dilatation of the distal common bile duct and proximal pancreatic duct without evidence of pancreatic mass. 4. Thick-walled urinary bladder, may be related to chronic renal disease or seen with cystitis. 5. Questionable wall thickening involving the proximal sigmoid colon. 6. Abdominopelvic  ascites, small to moderate in degree, diminished from prior CT. 7. Multi chamber cardiomegaly. Trace left pleural effusion and basilar atelectasis.  Interventional Radiology has been requested by the Surgical team to evaluate this patient for an image-guided pelvic fluid collection aspiration with possible drain placement.   Past Medical History:  Diagnosis Date   Blood dyscrasia    Blood transfusion    "I've had a bunch of them"   CHF (congestive heart failure) ([place])    followed by hf clinic   Chronic kidney disease (CKD), stage III (moderate) ([place])    Dialysis patient ([place]) 04/2019   AV fistula (right arm)   Gout    Headache(784.0)    "probably weekly" (01/13/2014)   Legally blind    Shortness of breath dyspnea    Sickle cell disease, type SS (HCC)    Swelling abdomen    Swelling of extremity, left    Swelling of extremity, right     Past Surgical History:  Procedure Laterality Date   A/V FISTULAGRAM Right 07/29/2020   Procedure: A/V FISTULAGRAM - Right Arm;  Surgeon: Angelia Mould, MD;  Location: [place] [facility];  Service: Cardiovascular;  Laterality: Right;   BASCILIC VEIN TRANSPOSITION Right 04/12/2014   Procedure: BASILIC VEIN TRANSPOSITION;  Surgeon: Elam Dutch, MD;  Location: [place];  Service: Vascular;  Laterality: Right;   BIOPSY  08/04/2020   Procedure: BIOPSY;  Surgeon: Ladene Artist, MD;  Location: [place];  Service: Gastroenterology;;   BIOPSY  06/28/2021   Procedure: BIOPSY;  Surgeon: Jerene Bears, MD;  Location: [place];  Service: Gastroenterology;;   BOWEL RESECTION N/A 05/24/2021   Procedure: SMALL BOWEL RESECTION;  Surgeon: Rolm Bookbinder, MD;  Location: [place];  Service: General;  Laterality: N/A;  CARDIAC CATHETERIZATION  05/2011   CARDIOVERSION N/A 06/05/2018   Procedure: CARDIOVERSION;  Surgeon: Jolaine Artist, MD;  Location: [facility] ENDOSCOPY;  Service: Cardiovascular;  Laterality: N/A;   CARDIOVERSION N/A 10/06/2019    Procedure: CARDIOVERSION;  Surgeon: Jolaine Artist, MD;  Location: [facility] ENDOSCOPY;  Service: Cardiovascular;  Laterality: N/A;   CHOLECYSTECTOMY N/A 05/24/2021   Procedure: OPEN CHOLECYSTECTOMY;  Surgeon: Rolm Bookbinder, MD;  Location: [place];  Service: General;  Laterality: N/A;   COLONOSCOPY WITH PROPOFOL N/A 09/24/2017   Procedure: COLONOSCOPY WITH PROPOFOL;  Surgeon: Jackquline Denmark, MD;  Location: [facility] ENDOSCOPY;  Service: Endoscopy;  Laterality: N/A;   ESOPHAGOGASTRODUODENOSCOPY N/A 09/19/2017   Procedure: ESOPHAGOGASTRODUODENOSCOPY (EGD);  Surgeon: Jackquline Denmark, MD;  Location: [facility] ENDOSCOPY;  Service: Endoscopy;  Laterality: N/A;   ESOPHAGOGASTRODUODENOSCOPY N/A 05/29/2021   Procedure: ESOPHAGOGASTRODUODENOSCOPY (EGD);  Surgeon: Milus Banister, MD;  Location: [facility] ENDOSCOPY;  Service: Gastroenterology;  Laterality: N/A;   ESOPHAGOGASTRODUODENOSCOPY (EGD) WITH PROPOFOL N/A 08/04/2020   Procedure: ESOPHAGOGASTRODUODENOSCOPY (EGD) WITH PROPOFOL;  Surgeon: Ladene Artist, MD;  Location: [place];  Service: Gastroenterology;  Laterality: N/A;   ESOPHAGOGASTRODUODENOSCOPY (EGD) WITH PROPOFOL N/A 06/28/2021   Procedure: ESOPHAGOGASTRODUODENOSCOPY (EGD) WITH PROPOFOL;  Surgeon: Jerene Bears, MD;  Location: [facility] ENDOSCOPY;  Service: Gastroenterology;  Laterality: N/A;   ESOPHAGOGASTRODUODENOSCOPY (EGD) WITH PROPOFOL N/A 08/02/2021   Procedure: ESOPHAGOGASTRODUODENOSCOPY (EGD) WITH PROPOFOL;  Surgeon: Lavena Bullion, DO;  Location: [place];  Service: Gastroenterology;  Laterality: N/A;   HEMOSTASIS CLIP PLACEMENT  05/29/2021   Procedure: HEMOSTASIS CLIP PLACEMENT;  Surgeon: Milus Banister, MD;  Location: [facility] ENDOSCOPY;  Service: Gastroenterology;;   HOT HEMOSTASIS N/A 05/29/2021   Procedure: HOT HEMOSTASIS (ARGON PLASMA COAGULATION/BICAP);  Surgeon: Milus Banister, MD;  Location: [facility] ENDOSCOPY;  Service: Gastroenterology;  Laterality: N/A;   HOT HEMOSTASIS N/A 06/28/2021   Procedure: HOT  HEMOSTASIS (ARGON PLASMA COAGULATION/BICAP);  Surgeon: Jerene Bears, MD;  Location: [facility] ENDOSCOPY;  Service: Gastroenterology;  Laterality: N/A;   HOT HEMOSTASIS N/A 08/02/2021   Procedure: HOT HEMOSTASIS (ARGON PLASMA COAGULATION/BICAP);  Surgeon: Lavena Bullion, DO;  Location: [facility] ENDOSCOPY;  Service: Gastroenterology;  Laterality: N/A;   IR PERC TUN PERIT CATH WO PORT S&I /IMAG  05/23/2021   IR REMOVAL TUN CV CATH W/O FL  06/10/2021   IR US GUIDE VASC ACCESS RIGHT  05/23/2021   LAPAROSCOPY N/A 05/24/2021   Procedure: LAPAROSCOPY DIAGNOSTIC;  Surgeon: Rolm Bookbinder, MD;  Location: [place];  Service: General;  Laterality: N/A;   LAPAROTOMY N/A 05/24/2021   Procedure: EXPLORATORY LAPAROTOMY;  Surgeon: Rolm Bookbinder, MD;  Location: [place];  Service: General;  Laterality: N/A;   LEFT AND RIGHT HEART CATHETERIZATION WITH CORONARY ANGIOGRAM N/A 06/11/2011   Procedure: LEFT AND RIGHT HEART CATHETERIZATION WITH CORONARY ANGIOGRAM;  Surgeon: Larey Dresser, MD;  Location: [facility] CATH LAB;  Service: Cardiovascular;  Laterality: N/A;   PERIPHERAL VASCULAR BALLOON ANGIOPLASTY Right 07/29/2020   Procedure: PERIPHERAL VASCULAR BALLOON ANGIOPLASTY;  Surgeon: Angelia Mould, MD;  Location: [place] [facility];  Service: Cardiovascular;  Laterality: Right;  arm fistula   REVISON OF ARTERIOVENOUS FISTULA Right 08/08/2020   Procedure: ANEURYSM EXCISION OF RIGHT UPPER EXTREMITY ARTERIOVENOUS FISTULA;  Surgeon: Angelia Mould, MD;  Location: [facility] OR;  Service: Vascular;  Laterality: Right;   SCLEROTHERAPY  05/29/2021   Procedure: Clide Deutscher;  Surgeon: Milus Banister, MD;  Location: [facility] ENDOSCOPY;  Service: Gastroenterology;;   Lavell Islam REMOVAL  05/29/2021   Procedure: STENT REMOVAL;  Surgeon: Ardis Hughs,  Melene Plan, MD;  Location: [facility] ENDOSCOPY;  Service: Gastroenterology;;   TEE WITHOUT CARDIOVERSION N/A 12/27/2015   Procedure: TRANSESOPHAGEAL ECHOCARDIOGRAM (TEE);  Surgeon: Sueanne Margarita, MD;  Location:  [facility] ENDOSCOPY;  Service: Cardiovascular;  Laterality: N/A;    Allergies: Patient has no known allergies.  Medications: Prior to Admission medications   Medication Sig Start Date End Date Taking? Authorizing Provider  Acetaminophen (TYLENOL 8 HOUR PO) Patient takes two 500 mg tablets as needed for pain.   Yes [provider]  albuterol (VENTOLIN HFA) 108 (90 Base) MCG/ACT inhaler Inhale 2 puffs into the lungs every 6 (six) hours as needed for wheezing or shortness of breath. 05/09/20  Yes Freddi Starr, MD  allopurinol (ZYLOPRIM) 100 MG tablet TAKE 1 TABLET BY MOUTH EVERY DAY Patient taking differently: Take 100 mg by mouth daily. 07/13/21  Yes Dorena Dew, FNP  AURYXIA 1 GM 210 MG(Fe) tablet Take 420 mg by mouth 3 (three) times daily. 02/11/21  Yes [provider]  folic acid (FOLVITE) 1 MG tablet TAKE 1 TABLET BY MOUTH EVERY DAY Patient taking differently: Take 1 mg by mouth daily. 02/22/21  Yes Dorena Dew, FNP  Glycerin-Hypromellose-PEG 400 (VISINE DRY EYE OP) Place 1 drop into both eyes as needed (dryness).   Yes [provider]  hydroxyurea (HYDREA) 500 MG capsule Take 1 capsule (500 mg total) by mouth daily. TAKE 1 CAPSULE BY MOUTH EVERY DAY WITH FOOD TO MINIMIZE GI SIDE EFFECTS Patient taking differently: Take 500 mg by mouth daily before breakfast. 01/31/21  Yes Dorena Dew, FNP  lidocaine-prilocaine (EMLA) cream Apply 1 application topically daily as needed (port access).  09/08/19  Yes [provider]  oxycodone (OXY-IR) 5 MG capsule Take 2 capsules (10 mg total) by mouth every 6 (six) hours as needed for pain. 07/31/21  Yes Dorena Dew, FNP  pantoprazole (PROTONIX) 40 MG tablet TAKE 1 TABLET BY MOUTH TWICE A DAY Patient taking differently: Take 40 mg by mouth 2 (two) times daily. 07/26/21  Yes Willia Craze, NP  tiZANidine (ZANAFLEX) 4 MG tablet Take 0.5 tablets (2 mg total) by mouth every 8 (eight) hours as needed for  muscle spasms. 07/18/21  Yes Dorena Dew, FNP  torsemide (DEMADEX) 20 MG tablet Take 20 mg by mouth in the morning, at noon, in the evening, and at bedtime.   Yes [provider]  Vitamin D, Ergocalciferol, (DRISDOL) 1.25 MG (50000 UNIT) CAPS capsule TAKE 1 CAPSULE BY MOUTH ONE TIME PER WEEK Patient taking differently: Take 50,000 Units by mouth once a week. Monday 05/15/21  Yes Dorena Dew, FNP  carvedilol (COREG) 3.125 MG tablet Take 1 tablet (3.125 mg total) by mouth 2 (two) times daily. Patient not taking: Reported on 07/31/2021 07/17/21 10/15/21  Rafael Bihari, FNP  gabapentin (NEURONTIN) 100 MG capsule TAKE 1 CAPSULE BY MOUTH EVERYDAY AT BEDTIME Patient not taking: Reported on 07/31/2021 12/12/20   Dorena Dew, FNP     Family History  Problem Relation Age of Onset   Alcohol abuse Mother    Liver disease Mother    Cirrhosis Mother    Heart attack Mother     Social History   Socioeconomic History   Marital status: Divorced    Spouse name: Not on file   Number of children: Not on file   Years of education: Not on file   Highest education level: Not on file  Occupational History   Occupation: disabled  Tobacco  Use   Smoking status: Former    Packs/day: 0.50    Years: 41.00    Pack years: 20.50    Types: Cigarettes    Quit date: 12/08/2015    Years since quitting: 5.6   Smokeless tobacco: Never  Vaping Use   Vaping Use: Never used  Substance and Sexual Activity   Alcohol use: Not Currently    Alcohol/week: 3.0 standard drinks    Types: 1 Glasses of wine, 2 Cans of beer per week    Comment: occasional   Drug use: No   Sexual activity: Not Currently  Other Topics Concern   Not on file  Social History Narrative   Not on file   Social Determinants of Health   Financial Resource Strain: Low Risk    Difficulty of Paying Living Expenses: Not very hard  Food Insecurity: No Food Insecurity   Worried About Charity fundraiser in the Last Year:  Never true   Ran Out of Food in the Last Year: Never true  Transportation Needs: No Transportation Needs   Lack of Transportation (Medical): No   Lack of Transportation (Non-Medical): No  Physical Activity: Not on file  Stress: Not on file  Social Connections: Not on file    Review of Systems: A 12 point ROS discussed and pertinent positives are indicated in the HPI above.  All other systems are negative.  Review of Systems  Constitutional:  Positive for appetite change and fatigue.  Eyes:  Positive for visual disturbance.       Legally blind  Respiratory:  Negative for cough and shortness of breath.   Cardiovascular:  Negative for chest pain and leg swelling.  Gastrointestinal:  Negative for abdominal pain, nausea and vomiting.  Musculoskeletal:  Positive for back pain.  Neurological:  Negative for headaches.   Vital Signs: BP 104/71    Pulse 82    Temp (!) 96.6 F (35.9 C) (Oral)    Resp 11    Ht 5\' 11"  (1.803 m)    Wt 130 lb 1.1 oz (59 kg)    SpO2 100%    BMI 18.14 kg/m   Physical Exam Constitutional:      General: He is not in acute distress.    Appearance: He is underweight.  HENT:     Nose:     Comments: NG tube to low wall suction. Dark green/brown fluid in collection canister.     Mouth/Throat:     Mouth: Mucous membranes are moist.     Pharynx: Oropharynx is clear.  Cardiovascular:     Rate and Rhythm: Normal rate and regular rhythm.     Comments: Right arm AVF. Positive for thrill/bruit. Currently de-accessed but patient did have HD today.  Pulmonary:     Effort: Pulmonary effort is normal.     Breath sounds: Normal breath sounds.  Abdominal:     General: Bowel sounds are normal.     Palpations: Abdomen is soft.     Tenderness: There is no abdominal tenderness.  Musculoskeletal:     Right lower leg: No edema.     Left lower leg: No edema.  Skin:    General: Skin is warm and dry.  Neurological:     Mental Status: He is alert and oriented to person, place,  and time.    Imaging: DG Abd 1 View  Result Date: 08/04/2021 CLINICAL DATA:  NG tube placement. EXAM: ABDOMEN - 1 VIEW COMPARISON:  Abdominopelvic CT earlier today. FINDINGS: Tip  of the enteric tube is below the diaphragm, the side port is in the region of the gastroesophageal junction. Recommend advancement of approximately 7 cm to place the side-port below the diaphragm. Mild gaseous gastric distension. IMPRESSION: Tip of the enteric tube below the diaphragm in the stomach, side-port in the region of the gastroesophageal junction. Recommend advancement of approximately 7 cm to place the side-port below the diaphragm. Electronically Signed   By: Keith Rake M.D.   On: 08/04/2021 21:05   MR LUMBAR SPINE WO CONTRAST  Result Date: 08/04/2021 CLINICAL DATA:  66 year old male with low back pain. Abdominal pain and ascites. EXAM: MRI LUMBAR SPINE WITHOUT CONTRAST TECHNIQUE: Multiplanar, multisequence MR imaging of the lumbar spine was performed. No intravenous contrast was administered. COMPARISON:  CT Abdomen and Pelvis 06/27/2021. FINDINGS: Unfortunately, due to the presence of ascites diagnostic MRI of the lumbar spine sub for due to poor signal to noise acquisition, despite imaging attempts on both 1.5 and 3.0 Tesla scanners. Subsequently, only sagittal T1 weighted imaging could be obtained. Lumbar segmentation appears to be normal and will be designated as such for this report. Diffusely heterogeneous T1 marrow signal throughout the visible spine and pelvis. Comparison to prior CT since last year demonstrates evidence of diffuse renal osteodystrophy with heterogeneous sclerosis. No destructive osseous lesion is identified. Grossly normal lower spinal cord and conus medullaris, which terminates at L1-L2. No evidence of lower thoracic or lumbar spinal stenosis. There is lumbar disc space loss at both L2-L3 and L5-S1, with vacuum disc there on the CT last month. Grossly negative lumbar paraspinal soft  tissues. IMPRESSION: 1. Very limited exam, truncated to only sagittal T1 weighted imaging secondary to difficulty with diagnostic lumbar MR image quality due to the presence of Ascites. 2. No lower thoracic or lumbar spinal stenosis, despite chronic L2-L3 and L5-S1 disc degeneration. 3. Heterogeneous marrow signal throughout the visible spine and pelvis which seems to correlate to renal osteodystrophy on prior CTs. No destructive lumbar vertebral lesion. Electronically Signed   By: Genevie Ann M.D.   On: 08/04/2021 12:17   CT ABDOMEN PELVIS W CONTRAST  Result Date: 08/04/2021 CLINICAL DATA:  Nausea and vomiting. EXAM: CT ABDOMEN AND PELVIS WITH CONTRAST TECHNIQUE: Multidetector CT imaging of the abdomen and pelvis was performed using the standard protocol following bolus administration of intravenous contrast. RADIATION DOSE REDUCTION: This exam was performed according to the departmental dose-optimization program which includes automated exposure control, adjustment of the mA and/or kV according to patient size and/or use of iterative reconstruction technique. CONTRAST:  155mL OMNIPAQUE IOHEXOL 350 MG/ML SOLN COMPARISON:  Abdominopelvic CT 06/27/2021 FINDINGS: Lower chest: Multi chamber cardiomegaly. Trace left pleural effusion and basilar atelectasis. Patient is leaning to the left. Hepatobiliary: No focal liver lesion. Mild hypertrophy of the left hepatic lobe. No convincing capsular nodularity. Pneumobilia with air in the common bile duct and central intrahepatic biliary tree. Distal common bile duct measures 10 mm. Cholecystectomy. Pancreas: No peripancreatic fat stranding. Prominent proximal pancreatic duct at 4 mm. No obvious pancreatic mass. Spleen: Atrophic and calcified. Adrenals/Urinary Tract: No adrenal nodule. No hydronephrosis. There are multiple bilateral renal cysts. Mild bilateral renal parenchymal atrophy. There is absent renal excretion on delayed phase imaging. Thick-walled urinary bladder.  Stomach/Bowel: Detailed bowel assessment is limited in the absence of enteric contrast, paucity of abdominal fat, and presence of abdominopelvic ascites. The stomach is dilated and fluid-filled. Clips are seen within the anterior gastric lumen. Dependent hyperdensity in the stomach likely ingested pills. The duodenum and  proximal small bowel are dilated and fluid-filled there are intermittently areas of decompressed small bowel with wall thickening in the region of small bowel dilatation. Area transition in the left mid abdomen related small bowel thickening. The more distal small bowel is decompressed. There are right lower quadrant chain sutures. Lateral to these chain sutures is a thick-walled fluid collection measuring 9.1 x 5.3 x 4.4 cm, series 3, image 58 and series 6, image 40. This contains non dependent air, with linear air tracking about the right lateral aspect into the right pelvis. This appears new from prior exam. The appendix is present in contains enteric contrast. There is wall thickening of the ascending colon. Remainder of the colon is decompressed and not well assessed on the current exam. Questionable wall thickening involving the proximal sigmoid colon. There is stool distending the rectum. Vascular/Lymphatic: Moderate aortic atherosclerosis. Moderate branch atherosclerosis. Patent portal and splenic veins. No obvious abdominopelvic adenopathy, assessment limited by ascites and paucity of abdominal fat. Reproductive: Unremarkable prostate. Other: Abdominopelvic ascites, small to moderate in degree, diminished from prior CT. There is a thick-walled fluid collection in the anterior pelvis as described above. Generalized edema of the mesenteric and subcutaneous fat. No pneumoperitoneum. Musculoskeletal: Heterogeneous diffuse osseous sclerosis may represent sequela of renal osteodystrophy or sickle cell. L5-S1 degenerative disc disease. No acute osseous findings are seen. IMPRESSION: 1. Findings  suspicious for small-bowel obstruction with transition point in the left mid abdomen, possibly due to adhesions. 2. Thick-walled fluid collection in the anterior pelvis measuring 9.1 x 5.3 x 4.4 cm, suspicious for abscess. This is new from prior exam. Underlying etiology is indeterminate, this is adjacent to enteric sutures from prior bowel resection. 3. Pneumobilia with air in the common bile duct and central intrahepatic biliary tree, likely related to prior sphincterotomy. Mild dilatation of the distal common bile duct and proximal pancreatic duct without evidence of pancreatic mass. 4. Thick-walled urinary bladder, may be related to chronic renal disease or seen with cystitis. 5. Questionable wall thickening involving the proximal sigmoid colon. 6. Abdominopelvic ascites, small to moderate in degree, diminished from prior CT. 7. Multi chamber cardiomegaly. Trace left pleural effusion and basilar atelectasis. Aortic Atherosclerosis (ICD10-I70.0). Electronically Signed   By: Keith Rake M.D.   On: 08/04/2021 18:58    Labs:  CBC: Recent Labs    08/03/21 1838 08/04/21 0241 08/04/21 1649 08/05/21 0942  WBC 10.4 9.6 12.8* 12.5*  HGB 8.9* 7.8* 9.2* 7.3*  HCT 25.7* 22.1* 27.3* 21.6*  PLT 210 208 PLATELET CLUMPS NOTED ON SMEAR, UNABLE TO ESTIMATE 210    COAGS: Recent Labs    05/24/21 1855 05/29/21 0113 05/29/21 1327 06/27/21 0830  INR 1.2 1.3* 1.3* 1.9*  APTT  --   --  33  --     BMP: Recent Labs    08/01/21 0758 08/02/21 0143 08/04/21 0241 08/05/21 0345  NA 128* 136 136 134*  K 4.9 4.1 3.1* 3.8  CL 90* 98 99 98  CO2 23 23 27  21*  GLUCOSE 72 65* 79 75  BUN 75* 28* 22 34*  CALCIUM 8.7* 8.5* 8.7* 8.8*  CREATININE 6.26* 2.93* 2.72* 3.73*  GFRNONAA 9* 23* 25* 17*    LIVER FUNCTION TESTS: Recent Labs    06/05/21 0350 06/06/21 0719 06/27/21 0830 07/31/21 1611 08/01/21 0758 08/04/21 0241 08/05/21 0345  BILITOT 1.5*  --  3.0* 1.4*  --  1.5*  --   AST 20  --  23 14*   --  10*  --  ALT 16  --  14 10  --  9  --   ALKPHOS 263*  --  563* 237*  --  180*  --   PROT 5.4*  --  6.1* 6.5  --  5.9*  --   ALBUMIN 1.7*   < > <1.5* <1.5* <1.5* 1.5*   1.5* 1.6*   < > = values in this interval not displayed.    TUMOR MARKERS: No results for input(s): AFPTM, CEA, CA199, CHROMGRNA in the last 8760 hours.  Assessment and Plan:  Small bowel obstruction; pelvic fluid collection: Patrick [name], 66 year old male, is tentatively scheduled 08/06/21 for an image-guided pelvic fluid collection aspiration with possible drain placement.   Risks and benefits discussed with the patient including bleeding, infection, damage to adjacent structures, bowel perforation/fistula connection, and sepsis.  All of the patient's questions were answered, patient is agreeable to proceed. He will be NPO at midnight. AM labs have been ordered. He is not on any blood-thinning medications.   Consent signed and in IR.   Thank you for this interesting consult.  I greatly enjoyed meeting Maleek D Schoenberger and look forward to participating in their care.  A copy of this report was sent to the requesting provider on this date.  Electronically Signed: Soyla Dryer, AGACNP-BC [phone] 08/05/2021, 1:24 PM   I spent a total of 20 Minutes    in face to face in clinical consultation, greater than 50% of which was counseling/coordinating care for pelvic fluid collection aspiration with possible drain placement.

## 2021-08-06 ENCOUNTER — Other Ambulatory Visit: Payer: Self-pay | Admitting: Family Medicine

## 2021-08-06 ENCOUNTER — Inpatient Hospital Stay (HOSPITAL_COMMUNITY): Payer: HMO

## 2021-08-06 DIAGNOSIS — D649 Anemia, unspecified: Secondary | ICD-10-CM | POA: Diagnosis not present

## 2021-08-06 LAB — CBC
HCT: 23.1 % — ABNORMAL LOW (ref 39.0–52.0)
Hemoglobin: 7.9 g/dL — ABNORMAL LOW (ref 13.0–17.0)
MCH: 33.9 pg (ref 26.0–34.0)
MCHC: 34.2 g/dL (ref 30.0–36.0)
MCV: 99.1 fL (ref 80.0–100.0)
Platelets: 193 10*3/uL (ref 150–400)
RBC: 2.33 MIL/uL — ABNORMAL LOW (ref 4.22–5.81)
RDW: 22.8 % — ABNORMAL HIGH (ref 11.5–15.5)
WBC: 9.4 10*3/uL (ref 4.0–10.5)
nRBC: 3 % — ABNORMAL HIGH (ref 0.0–0.2)

## 2021-08-06 LAB — PROTIME-INR
INR: 1.4 — ABNORMAL HIGH (ref 0.8–1.2)
Prothrombin Time: 17.5 seconds — ABNORMAL HIGH (ref 11.4–15.2)

## 2021-08-06 MED ORDER — FENTANYL CITRATE (PF) 100 MCG/2ML IJ SOLN
INTRAMUSCULAR | Status: AC | PRN
  Administered 2021-08-06: 25 ug via INTRAVENOUS

## 2021-08-06 MED ORDER — FLUMAZENIL 0.5 MG/5ML IV SOLN
INTRAVENOUS | Status: AC
  Filled 2021-08-06: qty 5

## 2021-08-06 MED ORDER — OXYCODONE HCL 5 MG PO TABS
15.0000 mg | ORAL_TABLET | ORAL | Status: DC | PRN
Start: 2021-08-06 — End: 2021-08-12
  Administered 2021-08-10 – 2021-08-11 (×3): 15 mg via ORAL
  Filled 2021-08-06 (×4): qty 3

## 2021-08-06 MED ORDER — MIDAZOLAM HCL 2 MG/2ML IJ SOLN
INTRAMUSCULAR | Status: AC | PRN
  Administered 2021-08-06: 1 mg via INTRAVENOUS

## 2021-08-06 MED ORDER — MIDAZOLAM HCL 2 MG/2ML IJ SOLN
INTRAMUSCULAR | Status: AC | PRN
  Administered 2021-08-06: .5 mg via INTRAVENOUS

## 2021-08-06 MED ORDER — MIDAZOLAM HCL 2 MG/2ML IJ SOLN
INTRAMUSCULAR | Status: AC
  Filled 2021-08-06: qty 4

## 2021-08-06 MED ORDER — LIDOCAINE HCL 1 % IJ SOLN
INTRAMUSCULAR | Status: AC
Start: 2021-08-06 — End: 2021-08-06
  Filled 2021-08-06: qty 10

## 2021-08-06 MED ORDER — FENTANYL CITRATE (PF) 100 MCG/2ML IJ SOLN
INTRAMUSCULAR | Status: AC | PRN
Start: 2021-08-06 — End: 2021-08-06
  Administered 2021-08-06: 25 ug via INTRAVENOUS

## 2021-08-06 MED ORDER — HYDROMORPHONE HCL 1 MG/ML IJ SOLN
0.5000 mg | INTRAMUSCULAR | Status: DC | PRN
  Administered 2021-08-06 – 2021-08-08 (×14): 1 mg via INTRAVENOUS
  Administered 2021-08-09 (×2): 0.5 mg via INTRAVENOUS
  Administered 2021-08-09 (×2): 1 mg via INTRAVENOUS
  Administered 2021-08-09: 0.5 mg via INTRAVENOUS
  Administered 2021-08-10 (×5): 1 mg via INTRAVENOUS
  Administered 2021-08-11: 0.5 mg via INTRAVENOUS
  Administered 2021-08-11: 1 mg via INTRAVENOUS
  Filled 2021-08-06 (×2): qty 1
  Filled 2021-08-06: qty 0.5
  Filled 2021-08-06 (×5): qty 1
  Filled 2021-08-06: qty 0.5
  Filled 2021-08-06: qty 1
  Filled 2021-08-06 (×2): qty 0.5
  Filled 2021-08-06 (×14): qty 1

## 2021-08-06 MED ORDER — NALOXONE HCL 0.4 MG/ML IJ SOLN
INTRAMUSCULAR | Status: AC
  Filled 2021-08-06: qty 1

## 2021-08-06 MED ORDER — FENTANYL CITRATE (PF) 100 MCG/2ML IJ SOLN
INTRAMUSCULAR | Status: AC
  Filled 2021-08-06: qty 2

## 2021-08-06 NOTE — Procedures (Signed)
Interventional Radiology Procedure:   Indications: Intra-abdominal abscess.  Abdominal surgery on 05/24/21  Procedure: CT guided placement of lower abdominal / pelvic abscess drain  Findings: 10 Fr drain placed in lower abdominal / pelvic abscess.  Removed 30 ml of gray colored purulent fluid.  Complications: No immediate complications noted.     EBL: Minimal  Plan: Send fluid for culture and follow output.    Bexton Haak R. Anselm Pancoast, MD  Pager: [phone]

## 2021-08-06 NOTE — Progress Notes (Signed)
4 Days Post-Op  Subjective: Having some flatus this morning.  1500cc from NGT yesterday.  Back pain secondary to his SCD  ROS: See above, otherwise other systems negative  Objective: Vital signs in last 24 hours: Temp:  [96.6 F (35.9 C)-97.8 F (36.6 C)] 97.6 F (36.4 C) (02/19 0721) Pulse Rate:  [79-90] 88 (02/19 0721) Resp:  [9-20] 16 (02/19 0721) BP: (94-109)/(51-71) 107/66 (02/19 0721) SpO2:  [98 %-100 %] 100 % (02/19 0721) Weight:  [56.9 kg] 56.9 kg (02/19 0500) Last BM Date : 08/06/21  Intake/Output from previous day: 02/18 0701 - 02/19 0700 In: 192.4 [IV Piggyback:192.4] Out: 2000 [Emesis/NG output:1500] Intake/Output this shift: No intake/output data recorded.  PE: Gen: NAD, laying in bed Abd: soft, NT, ND, midline wound almost completely healed.  Small area of scabbing still present.  NGT in place with bilious output. Psych: A&Ox3  Lab Results:  Recent Labs    08/05/21 0942 08/06/21 0219  WBC 12.5* 9.4  HGB 7.3* 7.9*  HCT 21.6* 23.1*  PLT 210 193   BMET Recent Labs    08/04/21 0241 08/05/21 0345  NA 136 134*  K 3.1* 3.8  CL 99 98  CO2 27 21*  GLUCOSE 79 75  BUN 22 34*  CREATININE 2.72* 3.73*  CALCIUM 8.7* 8.8*   PT/INR Recent Labs    08/06/21 0219  LABPROT 17.5*  INR 1.4*   CMP     Component Value Date/Time   NA 134 (L) 08/05/2021 0345   NA 135 05/24/2020 1119   K 3.8 08/05/2021 0345   CL 98 08/05/2021 0345   CO2 21 (L) 08/05/2021 0345   GLUCOSE 75 08/05/2021 0345   BUN 34 (H) 08/05/2021 0345   BUN 25 05/24/2020 1119   CREATININE 3.73 (H) 08/05/2021 0345   CREATININE 2.90 (H) 08/11/2015 1359   CALCIUM 8.8 (L) 08/05/2021 0345   PROT 5.9 (L) 08/04/2021 0241   PROT 7.5 05/24/2020 1119   ALBUMIN 1.6 (L) 08/05/2021 0345   ALBUMIN 4.1 05/24/2020 1119   AST 10 (L) 08/04/2021 0241   ALT 9 08/04/2021 0241   ALKPHOS 180 (H) 08/04/2021 0241   BILITOT 1.5 (H) 08/04/2021 0241   BILITOT 2.4 (H) 05/24/2020 1119   GFRNONAA 17 (L)  08/05/2021 0345   GFRNONAA 23 (L) 08/11/2015 1359   GFRAA 24 (L) 05/24/2020 1119   GFRAA 27 (L) 08/11/2015 1359   Lipase     Component Value Date/Time   LIPASE 30 06/27/2021 0830       Studies/Results: DG Abd 1 View  Result Date: 08/04/2021 CLINICAL DATA:  NG tube placement. EXAM: ABDOMEN - 1 VIEW COMPARISON:  Abdominopelvic CT earlier today. FINDINGS: Tip of the enteric tube is below the diaphragm, the side port is in the region of the gastroesophageal junction. Recommend advancement of approximately 7 cm to place the side-port below the diaphragm. Mild gaseous gastric distension. IMPRESSION: Tip of the enteric tube below the diaphragm in the stomach, side-port in the region of the gastroesophageal junction. Recommend advancement of approximately 7 cm to place the side-port below the diaphragm. Electronically Signed   By: Keith Rake M.D.   On: 08/04/2021 21:05   MR LUMBAR SPINE WO CONTRAST  Result Date: 08/04/2021 CLINICAL DATA:  66 year old male with low back pain. Abdominal pain and ascites. EXAM: MRI LUMBAR SPINE WITHOUT CONTRAST TECHNIQUE: Multiplanar, multisequence MR imaging of the lumbar spine was performed. No intravenous contrast was administered. COMPARISON:  CT Abdomen and Pelvis 06/27/2021. FINDINGS: Unfortunately,  due to the presence of ascites diagnostic MRI of the lumbar spine sub for due to poor signal to noise acquisition, despite imaging attempts on both 1.5 and 3.0 Tesla scanners. Subsequently, only sagittal T1 weighted imaging could be obtained. Lumbar segmentation appears to be normal and will be designated as such for this report. Diffusely heterogeneous T1 marrow signal throughout the visible spine and pelvis. Comparison to prior CT since last year demonstrates evidence of diffuse renal osteodystrophy with heterogeneous sclerosis. No destructive osseous lesion is identified. Grossly normal lower spinal cord and conus medullaris, which terminates at L1-L2. No  evidence of lower thoracic or lumbar spinal stenosis. There is lumbar disc space loss at both L2-L3 and L5-S1, with vacuum disc there on the CT last month. Grossly negative lumbar paraspinal soft tissues. IMPRESSION: 1. Very limited exam, truncated to only sagittal T1 weighted imaging secondary to difficulty with diagnostic lumbar MR image quality due to the presence of Ascites. 2. No lower thoracic or lumbar spinal stenosis, despite chronic L2-L3 and L5-S1 disc degeneration. 3. Heterogeneous marrow signal throughout the visible spine and pelvis which seems to correlate to renal osteodystrophy on prior CTs. No destructive lumbar vertebral lesion. Electronically Signed   By: Genevie Ann M.D.   On: 08/04/2021 12:17   CT ABDOMEN PELVIS W CONTRAST  Result Date: 08/04/2021 CLINICAL DATA:  Nausea and vomiting. EXAM: CT ABDOMEN AND PELVIS WITH CONTRAST TECHNIQUE: Multidetector CT imaging of the abdomen and pelvis was performed using the standard protocol following bolus administration of intravenous contrast. RADIATION DOSE REDUCTION: This exam was performed according to the departmental dose-optimization program which includes automated exposure control, adjustment of the mA and/or kV according to patient size and/or use of iterative reconstruction technique. CONTRAST:  169mL OMNIPAQUE IOHEXOL 350 MG/ML SOLN COMPARISON:  Abdominopelvic CT 06/27/2021 FINDINGS: Lower chest: Multi chamber cardiomegaly. Trace left pleural effusion and basilar atelectasis. Patient is leaning to the left. Hepatobiliary: No focal liver lesion. Mild hypertrophy of the left hepatic lobe. No convincing capsular nodularity. Pneumobilia with air in the common bile duct and central intrahepatic biliary tree. Distal common bile duct measures 10 mm. Cholecystectomy. Pancreas: No peripancreatic fat stranding. Prominent proximal pancreatic duct at 4 mm. No obvious pancreatic mass. Spleen: Atrophic and calcified. Adrenals/Urinary Tract: No adrenal nodule.  No hydronephrosis. There are multiple bilateral renal cysts. Mild bilateral renal parenchymal atrophy. There is absent renal excretion on delayed phase imaging. Thick-walled urinary bladder. Stomach/Bowel: Detailed bowel assessment is limited in the absence of enteric contrast, paucity of abdominal fat, and presence of abdominopelvic ascites. The stomach is dilated and fluid-filled. Clips are seen within the anterior gastric lumen. Dependent hyperdensity in the stomach likely ingested pills. The duodenum and proximal small bowel are dilated and fluid-filled there are intermittently areas of decompressed small bowel with wall thickening in the region of small bowel dilatation. Area transition in the left mid abdomen related small bowel thickening. The more distal small bowel is decompressed. There are right lower quadrant chain sutures. Lateral to these chain sutures is a thick-walled fluid collection measuring 9.1 x 5.3 x 4.4 cm, series 3, image 58 and series 6, image 40. This contains non dependent air, with linear air tracking about the right lateral aspect into the right pelvis. This appears new from prior exam. The appendix is present in contains enteric contrast. There is wall thickening of the ascending colon. Remainder of the colon is decompressed and not well assessed on the current exam. Questionable wall thickening involving the proximal sigmoid colon.  There is stool distending the rectum. Vascular/Lymphatic: Moderate aortic atherosclerosis. Moderate branch atherosclerosis. Patent portal and splenic veins. No obvious abdominopelvic adenopathy, assessment limited by ascites and paucity of abdominal fat. Reproductive: Unremarkable prostate. Other: Abdominopelvic ascites, small to moderate in degree, diminished from prior CT. There is a thick-walled fluid collection in the anterior pelvis as described above. Generalized edema of the mesenteric and subcutaneous fat. No pneumoperitoneum. Musculoskeletal:  Heterogeneous diffuse osseous sclerosis may represent sequela of renal osteodystrophy or sickle cell. L5-S1 degenerative disc disease. No acute osseous findings are seen. IMPRESSION: 1. Findings suspicious for small-bowel obstruction with transition point in the left mid abdomen, possibly due to adhesions. 2. Thick-walled fluid collection in the anterior pelvis measuring 9.1 x 5.3 x 4.4 cm, suspicious for abscess. This is new from prior exam. Underlying etiology is indeterminate, this is adjacent to enteric sutures from prior bowel resection. 3. Pneumobilia with air in the common bile duct and central intrahepatic biliary tree, likely related to prior sphincterotomy. Mild dilatation of the distal common bile duct and proximal pancreatic duct without evidence of pancreatic mass. 4. Thick-walled urinary bladder, may be related to chronic renal disease or seen with cystitis. 5. Questionable wall thickening involving the proximal sigmoid colon. 6. Abdominopelvic ascites, small to moderate in degree, diminished from prior CT. 7. Multi chamber cardiomegaly. Trace left pleural effusion and basilar atelectasis. Aortic Atherosclerosis (ICD10-I70.0). Electronically Signed   By: Keith Rake M.D.   On: 08/04/2021 18:58    Anti-infectives: Anti-infectives (From admission, onward)    Start     Dose/Rate Route Frequency Ordered Stop   08/05/21 2200  piperacillin-tazobactam (ZOSYN) IVPB 2.25 g        2.25 g 100 mL/hr over 30 Minutes Intravenous Every 8 hours 08/05/21 1455     08/04/21 2200  piperacillin-tazobactam (ZOSYN) IVPB 3.375 g  Status:  Discontinued        3.375 g 12.5 mL/hr over 240 Minutes Intravenous Every 8 hours 08/04/21 1927 08/05/21 1455        Assessment/Plan 2 months s/p ex lap with cholecystectomy, SBR, now with recurrent SBO and intra-abdominal fluid collection -aspiration/drain placement today by IR -hopefully after this collection gets addressed his bowel function will start to  return -repeat plain films in the morning, if still with dilatation, will likely start the SBO protocol at that time. -mobilize as able  FEN - NPO/NGT/IVFs VTE - on hold due to UGI bleed ID - zosyn  UGI bleed, see above ESRD on HD CHF Severe MR, TR A fib HTN Sickle Cell disease Anemia   LOS: 6 days    Henreitta Cea , [facility] 08/06/2021, 10:17 AM Please see Amion for pager number during day hours 7:00am-4:30pm or 7:00am -11:30am on weekends

## 2021-08-06 NOTE — Progress Notes (Signed)
[facility] Progress Note   Subjective:   Reports HD went well yesterday. Continues to have back pain but no abdominal pain or nausea this Am. Denies SOB, CP, palpitations, dizziness.   Objective Vitals:   08/05/21 1731 08/05/21 2000 08/06/21 0500 08/06/21 0721  BP: (!) 94/51 (!) 96/57  107/66  Pulse: 87 79  88  Resp: 20 17  16   Temp:  97.8 F (36.6 C)  97.6 F (36.4 C)  TempSrc:  Oral  Oral  SpO2: 100% 98%  100%  Weight:   56.9 kg   Height:       Physical Exam General: Ill appearing male, NG tube in place. Alert and in NAD Heart: RRR, no murmurs, rubs or gallops Lungs: CTA bilaterally without wheezing, rhonchi or rales Abdomen: Non-distended, +BS Extremities: No edema b/l lower extremities Dialysis Access:  RUE AVF +T/b  Additional Objective Labs: Basic Metabolic Panel: Recent Labs  Lab 08/01/21 0758 08/02/21 0143 08/04/21 0241 08/05/21 0345  NA 128* 136 136 134*  K 4.9 4.1 3.1* 3.8  CL 90* 98 99 98  CO2 23 23 27  21*  GLUCOSE 72 65* 79 75  BUN 75* 28* 22 34*  CREATININE 6.26* 2.93* 2.72* 3.73*  CALCIUM 8.7* 8.5* 8.7* 8.8*  PHOS 4.9*  --  3.3 3.9   Liver Function Tests: Recent Labs  Lab 07/31/21 1611 08/01/21 0758 08/04/21 0241 08/05/21 0345  AST 14*  --  10*  --   ALT 10  --  9  --   ALKPHOS 237*  --  180*  --   BILITOT 1.4*  --  1.5*  --   PROT 6.5  --  5.9*  --   ALBUMIN <1.5* <1.5* 1.5*   1.5* 1.6*   No results for input(s): LIPASE, AMYLASE in the last 168 hours. CBC: Recent Labs  Lab 08/03/21 0504 08/03/21 1838 08/04/21 0241 08/04/21 1649 08/05/21 0942 08/06/21 0219  WBC 14.8* 10.4 9.6 12.8* 12.5* 9.4  NEUTROABS 12.5*  --  7.6  --   --   --   HGB 8.4* 8.9* 7.8* 9.2* 7.3* 7.9*  HCT 24.6* 25.7* 22.1* 27.3* 21.6* 23.1*  MCV 96.5 96.3 95.7 99.3 98.6 99.1  PLT 253 210 208 PLATELET CLUMPS NOTED ON SMEAR, UNABLE TO ESTIMATE 210 193   Blood Culture    Component Value Date/Time   SDES BLOOD LEFT WRIST 06/27/2021 0835    SPECREQUEST  06/27/2021 0835    BOTTLES DRAWN AEROBIC AND ANAEROBIC Blood Culture adequate volume   CULT  06/27/2021 0835    NO GROWTH 5 DAYS Performed at [facility], [place] [address], [place], [place] [address]    REPTSTATUS 07/02/2021 FINAL 06/27/2021 0835    Cardiac Enzymes: No results for input(s): CKTOTAL, CKMB, CKMBINDEX, TROPONINI in the last 168 hours. CBG: No results for input(s): GLUCAP in the last 168 hours. Iron Studies: No results for input(s): IRON, TIBC, TRANSFERRIN, FERRITIN in the last 72 hours. @lablastinr3 @ Studies/Results: DG Abd 1 View  Result Date: 08/04/2021 CLINICAL DATA:  NG tube placement. EXAM: ABDOMEN - 1 VIEW COMPARISON:  Abdominopelvic CT earlier today. FINDINGS: Tip of the enteric tube is below the diaphragm, the side port is in the region of the gastroesophageal junction. Recommend advancement of approximately 7 cm to place the side-port below the diaphragm. Mild gaseous gastric distension. IMPRESSION: Tip of the enteric tube below the diaphragm in the stomach, side-port in the region of the gastroesophageal junction. Recommend advancement of approximately 7 cm  to place the side-port below the diaphragm. Electronically Signed   By: Keith Rake M.D.   On: 08/04/2021 21:05   MR LUMBAR SPINE WO CONTRAST  Result Date: 08/04/2021 CLINICAL DATA:  66 year old male with low back pain. Abdominal pain and ascites. EXAM: MRI LUMBAR SPINE WITHOUT CONTRAST TECHNIQUE: Multiplanar, multisequence MR imaging of the lumbar spine was performed. No intravenous contrast was administered. COMPARISON:  CT Abdomen and Pelvis 06/27/2021. FINDINGS: Unfortunately, due to the presence of ascites diagnostic MRI of the lumbar spine sub for due to poor signal to noise acquisition, despite imaging attempts on both 1.5 and 3.0 Tesla scanners. Subsequently, only sagittal T1 weighted imaging could be obtained. Lumbar segmentation appears to be normal and will be designated as such for  this report. Diffusely heterogeneous T1 marrow signal throughout the visible spine and pelvis. Comparison to prior CT since last year demonstrates evidence of diffuse renal osteodystrophy with heterogeneous sclerosis. No destructive osseous lesion is identified. Grossly normal lower spinal cord and conus medullaris, which terminates at L1-L2. No evidence of lower thoracic or lumbar spinal stenosis. There is lumbar disc space loss at both L2-L3 and L5-S1, with vacuum disc there on the CT last month. Grossly negative lumbar paraspinal soft tissues. IMPRESSION: 1. Very limited exam, truncated to only sagittal T1 weighted imaging secondary to difficulty with diagnostic lumbar MR image quality due to the presence of Ascites. 2. No lower thoracic or lumbar spinal stenosis, despite chronic L2-L3 and L5-S1 disc degeneration. 3. Heterogeneous marrow signal throughout the visible spine and pelvis which seems to correlate to renal osteodystrophy on prior CTs. No destructive lumbar vertebral lesion. Electronically Signed   By: Genevie Ann M.D.   On: 08/04/2021 12:17   CT ABDOMEN PELVIS W CONTRAST  Result Date: 08/04/2021 CLINICAL DATA:  Nausea and vomiting. EXAM: CT ABDOMEN AND PELVIS WITH CONTRAST TECHNIQUE: Multidetector CT imaging of the abdomen and pelvis was performed using the standard protocol following bolus administration of intravenous contrast. RADIATION DOSE REDUCTION: This exam was performed according to the departmental dose-optimization program which includes automated exposure control, adjustment of the mA and/or kV according to patient size and/or use of iterative reconstruction technique. CONTRAST:  132mL OMNIPAQUE IOHEXOL 350 MG/ML SOLN COMPARISON:  Abdominopelvic CT 06/27/2021 FINDINGS: Lower chest: Multi chamber cardiomegaly. Trace left pleural effusion and basilar atelectasis. Patient is leaning to the left. Hepatobiliary: No focal liver lesion. Mild hypertrophy of the left hepatic lobe. No convincing  capsular nodularity. Pneumobilia with air in the common bile duct and central intrahepatic biliary tree. Distal common bile duct measures 10 mm. Cholecystectomy. Pancreas: No peripancreatic fat stranding. Prominent proximal pancreatic duct at 4 mm. No obvious pancreatic mass. Spleen: Atrophic and calcified. Adrenals/Urinary Tract: No adrenal nodule. No hydronephrosis. There are multiple bilateral renal cysts. Mild bilateral renal parenchymal atrophy. There is absent renal excretion on delayed phase imaging. Thick-walled urinary bladder. Stomach/Bowel: Detailed bowel assessment is limited in the absence of enteric contrast, paucity of abdominal fat, and presence of abdominopelvic ascites. The stomach is dilated and fluid-filled. Clips are seen within the anterior gastric lumen. Dependent hyperdensity in the stomach likely ingested pills. The duodenum and proximal small bowel are dilated and fluid-filled there are intermittently areas of decompressed small bowel with wall thickening in the region of small bowel dilatation. Area transition in the left mid abdomen related small bowel thickening. The more distal small bowel is decompressed. There are right lower quadrant chain sutures. Lateral to these chain sutures is a thick-walled fluid collection measuring 9.1  x 5.3 x 4.4 cm, series 3, image 58 and series 6, image 40. This contains non dependent air, with linear air tracking about the right lateral aspect into the right pelvis. This appears new from prior exam. The appendix is present in contains enteric contrast. There is wall thickening of the ascending colon. Remainder of the colon is decompressed and not well assessed on the current exam. Questionable wall thickening involving the proximal sigmoid colon. There is stool distending the rectum. Vascular/Lymphatic: Moderate aortic atherosclerosis. Moderate branch atherosclerosis. Patent portal and splenic veins. No obvious abdominopelvic adenopathy, assessment  limited by ascites and paucity of abdominal fat. Reproductive: Unremarkable prostate. Other: Abdominopelvic ascites, small to moderate in degree, diminished from prior CT. There is a thick-walled fluid collection in the anterior pelvis as described above. Generalized edema of the mesenteric and subcutaneous fat. No pneumoperitoneum. Musculoskeletal: Heterogeneous diffuse osseous sclerosis may represent sequela of renal osteodystrophy or sickle cell. L5-S1 degenerative disc disease. No acute osseous findings are seen. IMPRESSION: 1. Findings suspicious for small-bowel obstruction with transition point in the left mid abdomen, possibly due to adhesions. 2. Thick-walled fluid collection in the anterior pelvis measuring 9.1 x 5.3 x 4.4 cm, suspicious for abscess. This is new from prior exam. Underlying etiology is indeterminate, this is adjacent to enteric sutures from prior bowel resection. 3. Pneumobilia with air in the common bile duct and central intrahepatic biliary tree, likely related to prior sphincterotomy. Mild dilatation of the distal common bile duct and proximal pancreatic duct without evidence of pancreatic mass. 4. Thick-walled urinary bladder, may be related to chronic renal disease or seen with cystitis. 5. Questionable wall thickening involving the proximal sigmoid colon. 6. Abdominopelvic ascites, small to moderate in degree, diminished from prior CT. 7. Multi chamber cardiomegaly. Trace left pleural effusion and basilar atelectasis. Aortic Atherosclerosis (ICD10-I70.0). Electronically Signed   By: Keith Rake M.D.   On: 08/04/2021 18:58   Medications:  sodium chloride     sodium chloride     sodium chloride     piperacillin-tazobactam (ZOSYN)  IV 2.25 g (08/06/21 0601)   promethazine (PHENERGAN) injection (IM or IVPB) Stopped (08/02/21 0135)    (feeding supplement) PROSource Plus  30 mL Oral BID BM   allopurinol  100 mg Oral Daily   Chlorhexidine Gluconate Cloth  6 each Topical Q0600    darbepoetin (ARANESP) injection - DIALYSIS  200 mcg Intravenous Q Sat-HD   ferric citrate  420 mg Oral TID with meals   folic acid  1 mg Oral Daily   hydroxyurea  500 mg Oral Daily   pantoprazole (PROTONIX) IV  40 mg Intravenous Q12H   polyethylene glycol  17 g Oral Daily   senna-docusate  2 tablet Oral QHS   sodium chloride flush  3 mL Intravenous Q12H    Dialysis Orders: TTS - AF  4hrs, BFR 400, DFR 500,  EDW 55.5kg, 3K/ 2Ca UFP2   Access: RU AVF  Heparin none Mircera 225 mcg q2wks - last 07/20/21 Hectorol 39mcg IV qHD   Sensipar 30mg  qHD  Assessment/Plan:  Symptomatic acute on chronic anemia of CKD - Hx recurrent GIB. Hgb 5.2 on admit s/p 2 unit pRBC improved to 7.6, then drop to 6.6 post HD.  Given additional 1 unit pRBC. Hgb trending down again, 7.9 today. On PPI BID.   Aranesp given 2/18  ESRD -  on TTS.  Continue regular schedule.  Hyponatremia -  improved. Hypokalemia: Resolved, using higher K bath with HD  Hypotension/volume  -BP  runs soft.  Avoiding midodrine with prolonged QT. Does not appear volume overloaded.    Secondary Hyperparathyroidism -  Corrected calcium is high, holding VDRA. phos in goal.  Continue sensipar, binders once tolerating PO intake  Severe protein calorie malnutrition - Currently NPO. renal diet w/fluid restrictions and protein supplements when advanced. A fib - not on eliquis d/t GIB SBO and abdominal abscess - per PMD Sickle cell anemia - chronic, having back pain this admission     Anice Paganini, PA-C 08/06/2021, 8:54 AM  [facility] Pager: [phone]

## 2021-08-06 NOTE — Plan of Care (Signed)
  Problem: Education: Goal: Knowledge of General Education information will improve Description Including pain rating scale, medication(s)/side effects and non-pharmacologic comfort measures Outcome: Progressing   Problem: Health Behavior/Discharge Planning: Goal: Ability to manage health-related needs will improve Outcome: Progressing   Problem: Clinical Measurements: Goal: Ability to maintain clinical measurements within normal limits will improve Outcome: Progressing Goal: Will remain free from infection Outcome: Progressing Goal: Diagnostic test results will improve Outcome: Progressing Goal: Respiratory complications will improve Outcome: Progressing Goal: Cardiovascular complication will be avoided Outcome: Progressing   Problem: Clinical Measurements: Goal: Will remain free from infection Outcome: Progressing   Problem: Clinical Measurements: Goal: Diagnostic test results will improve Outcome: Progressing   Problem: Clinical Measurements: Goal: Respiratory complications will improve Outcome: Progressing   Problem: Clinical Measurements: Goal: Cardiovascular complication will be avoided Outcome: Progressing   

## 2021-08-06 NOTE — Progress Notes (Signed)
PROGRESS NOTE        PATIENT DETAILS Name: Patrick [name] Age: 66 y.o. Sex: male Date of Birth: May 26, 1956 Admit Date: 07/31/2021 Admitting Physician Eben Burow, MD [id number], Asencion Partridge, FNP  Brief Summary: 66 year old male with history of ESRD on HD, sickle cell disease, permanent atrial fibrillation, history of UGI due to AVMs, recent history (December 2022) of choledocholithiasis and ischemic small bowel requiring cholecystectomy/biliary stent placement/bowel resection-referred to the ED from HD center for hemoglobin of 5.8-subsequent EGD on 2/15 showed bleeding AVMs.  Hospital course complicated by persistent nausea and vomiting-see below for further details.  Significant events: 2/13>> referred to the ED from HD center for evaluation of hemoglobin of 5.8.   2/17>> ?SBO/Abscess on CT abdomen/pelvis -General surgery consulted 2/19>> percutaneous pelvic drain placed; worsening chronic back pain  Significant imaging studies: 2/17>> MRI LS spine: Limited exams-no acute abnormalities. 2/17>> CT abd/pelvis: Questionable SBO and abcess  Significant microbiology data: 2/13>> COVID/influenza PCR: Negative  Procedures: 2/15>> EGD: Multiple bleeding angiectasia's in the stomach treated with argon plasma coagulation 2/19>> Pelvic fluid drain placed, 10 [place]  Consults: GI, nephrology, Gen Sx  Subjective: No acute issues or events overnight, tolerating dialysis well plan for IR evaluation later today  Objective: Vitals: Blood pressure (!) 96/57, pulse 79, temperature 97.8 F (36.6 C), temperature source Oral, resp. rate 17, height 5\' 11"  (1.803 m), weight 56.9 kg, SpO2 98 %.   Exam: Gen Exam:Alert awake-not in any distress HEENT:atraumatic, normocephalic Chest: B/L clear to auscultation anteriorly CVS:S1S2 regular Abdomen: Soft-minimally tender in the mid abdominal area-midline dressing in place-midline incision without any signs of  infection.  Dressing is not soaked. Extremities:no edema Neurology: Non focal exam-easily moving legs off the bed against gravity and against resistance.  No gross sensory deficits. Skin: no rash   Pertinent Labs/Radiology: CBC Latest Ref Rng & Units 08/06/2021 08/05/2021 08/04/2021  WBC 4.0 - 10.5 K/uL 9.4 12.5(H) 12.8(H)  Hemoglobin 13.0 - 17.0 g/dL 7.9(L) 7.3(L) 9.2(L)  Hematocrit 39.0 - 52.0 % 23.1(L) 21.6(L) 27.3(L)  Platelets 150 - 400 K/uL 193 210 PLATELET CLUMPS NOTED ON SMEAR, UNABLE TO ESTIMATE    Lab Results  Component Value Date   NA 134 (L) 08/05/2021   K 3.8 08/05/2021   CL 98 08/05/2021   CO2 21 (L) 08/05/2021      Assessment/Plan:  Upper GI bleeding with acute blood loss anemia:  - EGD on 2/15 showed bleeding AVMs-hemoglobin has stabilized after 3 units of PRBC transfusion so far.   -Hemoglobin labile follow CBC  Questionable SBO/Abdominal fluid collection/abscess -General surgery evaluated recommending IR for drainage of fluid collection to further evaluate for infection versus fluid collection given previous GI procedures concerning for biliary leak -Drain placed 08/06/2021 with gray purulent fluid withdrawn, follow labs  Nausea/vomiting improving:  -Improving somewhat, follow above  Recent history of choledocholithiasis-s/p ERCP with pancreatic/biliary stents (removed) followed by open cholecystectomy and resection of ischemic small bowel on 05/24/2021 -Stable, follow clinically -Follow fluid drainage as above to ensure no biliary leak  ESRD: On HD per nephrology.  Chronic HFrEF (EF 40-45% by echo December 2022):  -Volume status stable-volume removal with HD.  Permanent atrial fibrillation:  -Rate controlled with Coreg-not a candidate for anticoagulation due to recurrent GI bleeding.  HTN: BP stable-not on any antihypertensives.  Sickle cell disease Lumbago Pain somewhat worse over the past  24 hours, increase both p.o. and IV narcotics Hemoglobin  fluctuating between 7-8.  BMI: Estimated body mass index is 17.5 kg/m as calculated from the following:   Height as of this encounter: 5\' 11"  (1.803 m).   Weight as of this encounter: 56.9 kg.   Code status:   Code Status: Full Code   DVT Prophylaxis: SCDs Start: 07/31/21 2024   Family Communication: None at bedside  Disposition Plan: Status is: Inpatient Remains inpatient appropriate because: Upper GI bleeding-s/p EGD with APC of bleeding AVMs-now with persistent nausea/vomiting.  Work-up in progress.    Planned Discharge Destination:Home   Diet: Diet Order             Diet NPO time specified  Diet effective now                   Antimicrobial agents: Anti-infectives (From admission, onward)    Start     Dose/Rate Route Frequency Ordered Stop   08/05/21 2200  piperacillin-tazobactam (ZOSYN) IVPB 2.25 g        2.25 g 100 mL/hr over 30 Minutes Intravenous Every 8 hours 08/05/21 1455     08/04/21 2200  piperacillin-tazobactam (ZOSYN) IVPB 3.375 g  Status:  Discontinued        3.375 g 12.5 mL/hr over 240 Minutes Intravenous Every 8 hours 08/04/21 1927 08/05/21 1455        MEDICATIONS: Scheduled Meds:  (feeding supplement) PROSource Plus  30 mL Oral BID BM   allopurinol  100 mg Oral Daily   Chlorhexidine Gluconate Cloth  6 each Topical Q0600   darbepoetin (ARANESP) injection - DIALYSIS  200 mcg Intravenous Q Sat-HD   ferric citrate  420 mg Oral TID with meals   folic acid  1 mg Oral Daily   hydroxyurea  500 mg Oral Daily   pantoprazole (PROTONIX) IV  40 mg Intravenous Q12H   polyethylene glycol  17 g Oral Daily   senna-docusate  2 tablet Oral QHS   sodium chloride flush  3 mL Intravenous Q12H   Continuous Infusions:  sodium chloride     sodium chloride     sodium chloride     piperacillin-tazobactam (ZOSYN)  IV 2.25 g (08/06/21 0601)   promethazine (PHENERGAN) injection (IM or IVPB) Stopped (08/02/21 0135)   PRN Meds:.sodium chloride, sodium  chloride, acetaminophen **OR** acetaminophen, heparin, HYDROmorphone (DILAUDID) injection, lidocaine (PF), lidocaine-prilocaine, ondansetron **OR** ondansetron (ZOFRAN) IV, oxyCODONE, pentafluoroprop-tetrafluoroeth, promethazine (PHENERGAN) injection (IM or IVPB), sodium chloride flush   I have personally reviewed following labs and imaging studies  LABORATORY DATA: CBC: Recent Labs  Lab 08/03/21 0504 08/03/21 1838 08/04/21 0241 08/04/21 1649 08/05/21 0942 08/06/21 0219  WBC 14.8* 10.4 9.6 12.8* 12.5* 9.4  NEUTROABS 12.5*  --  7.6  --   --   --   HGB 8.4* 8.9* 7.8* 9.2* 7.3* 7.9*  HCT 24.6* 25.7* 22.1* 27.3* 21.6* 23.1*  MCV 96.5 96.3 95.7 99.3 98.6 99.1  PLT 253 210 208 PLATELET CLUMPS NOTED ON SMEAR, UNABLE TO ESTIMATE 210 193     Basic Metabolic Panel: Recent Labs  Lab 07/31/21 1611 07/31/21 1715 08/01/21 0645 08/01/21 0758 08/02/21 0143 08/04/21 0241 08/05/21 0345  NA 129*   < > 130* 128* 136 136 134*  K 4.2   < > 4.3 4.9 4.1 3.1* 3.8  CL 90*   < > 89* 90* 98 99 98  CO2 24  --  25 23 23 27  21*  GLUCOSE 84   < >  75 72 65* 79 75  BUN 67*   < > 72* 75* 28* 22 34*  CREATININE 6.16*   < > 6.37* 6.26* 2.93* 2.72* 3.73*  CALCIUM 8.7*  --  8.7* 8.7* 8.5* 8.7* 8.8*  MG 1.5*  --   --   --   --   --   --   PHOS  --   --   --  4.9*  --  3.3 3.9   < > = values in this interval not displayed.     GFR: Estimated Creatinine Clearance: 16.3 mL/min (A) (by C-G formula based on SCr of 3.73 mg/dL (H)).  Liver Function Tests: Recent Labs  Lab 07/31/21 1611 08/01/21 0758 08/04/21 0241 08/05/21 0345  AST 14*  --  10*  --   ALT 10  --  9  --   ALKPHOS 237*  --  180*  --   BILITOT 1.4*  --  1.5*  --   PROT 6.5  --  5.9*  --   ALBUMIN <1.5* <1.5* 1.5*   1.5* 1.6*    No results for input(s): LIPASE, AMYLASE in the last 168 hours. No results for input(s): AMMONIA in the last 168 hours.  Coagulation Profile: Recent Labs  Lab 08/06/21 0219  INR 1.4*    Cardiac  Enzymes: No results for input(s): CKTOTAL, CKMB, CKMBINDEX, TROPONINI in the last 168 hours.  BNP (last 3 results) No results for input(s): PROBNP in the last 8760 hours.  Lipid Profile: No results for input(s): CHOL, HDL, LDLCALC, TRIG, CHOLHDL, LDLDIRECT in the last 72 hours.  Thyroid Function Tests: No results for input(s): TSH, T4TOTAL, FREET4, T3FREE, THYROIDAB in the last 72 hours.  Anemia Panel: No results for input(s): VITAMINB12, FOLATE, FERRITIN, TIBC, IRON, RETICCTPCT in the last 72 hours.  Urine analysis:    Component Value Date/Time   COLORURINE STRAW (A) 06/02/2018 1900   APPEARANCEUR CLEAR 06/02/2018 1900   APPEARANCEUR Clear 05/31/2017 0902   LABSPEC 1.006 06/02/2018 1900   PHURINE 7.0 06/02/2018 1900   GLUCOSEU NEGATIVE 06/02/2018 1900   HGBUR SMALL (A) 06/02/2018 1900   BILIRUBINUR neg 01/26/2020 1124   BILIRUBINUR Negative 05/31/2017 0902   KETONESUR negative 09/08/2018 1621   KETONESUR NEGATIVE 06/02/2018 1900   PROTEINUR Positive (A) 01/26/2020 1124   PROTEINUR 30 (A) 06/02/2018 1900   UROBILINOGEN 1.0 01/26/2020 1124   UROBILINOGEN 0.2 09/14/2013 1354   NITRITE neg 01/26/2020 1124   NITRITE NEGATIVE 06/02/2018 1900   LEUKOCYTESUR Negative 01/26/2020 1124   LEUKOCYTESUR Negative 05/31/2017 0902    Sepsis Labs: Lactic Acid, Venous    Component Value Date/Time   LATICACIDVEN 1.3 06/27/2021 0830    MICROBIOLOGY: Recent Results (from the past 240 hour(s))  Resp Panel by RT-PCR (Flu A&B, Covid) Nasopharyngeal Swab     Status: None   Collection Time: 07/31/21 12:14 AM   Specimen: Nasopharyngeal Swab; Nasopharyngeal(NP) swabs in vial transport medium  Result Value Ref Range Status   SARS Coronavirus 2 by RT PCR NEGATIVE NEGATIVE Final    Comment: (NOTE) SARS-CoV-2 target nucleic acids are NOT DETECTED.  The SARS-CoV-2 RNA is generally detectable in upper respiratory specimens during the acute phase of infection. The lowest concentration of  SARS-CoV-2 viral copies this assay can detect is 138 copies/mL. A negative result does not preclude SARS-Cov-2 infection and should not be used as the sole basis for treatment or other patient management decisions. A negative result may occur with  improper specimen collection/handling, submission of specimen other  than nasopharyngeal swab, presence of viral mutation(s) within the areas targeted by this assay, and inadequate number of viral copies(<138 copies/mL). A negative result must be combined with clinical observations, patient history, and epidemiological information. The expected result is Negative.  Fact Sheet for Patients:  EntrepreneurPulse.com.au  Fact Sheet for Healthcare Providers:  IncredibleEmployment.be  This test is no t yet approved or cleared by the [place] FDA and  has been authorized for detection and/or diagnosis of SARS-CoV-2 by FDA under an Emergency Use Authorization (EUA). This EUA will remain  in effect (meaning this test can be used) for the duration of the COVID-19 declaration under Section 564(b)(1) of the Act, 21 U.S.C.section 360bbb-3(b)(1), unless the authorization is terminated  or revoked sooner.       Influenza A by PCR NEGATIVE NEGATIVE Final   Influenza B by PCR NEGATIVE NEGATIVE Final    Comment: (NOTE) The Xpert Xpress SARS-CoV-2/FLU/RSV plus assay is intended as an aid in the diagnosis of influenza from Nasopharyngeal swab specimens and should not be used as a sole basis for treatment. Nasal washings and aspirates are unacceptable for Xpert Xpress SARS-CoV-2/FLU/RSV testing.  Fact Sheet for Patients: EntrepreneurPulse.com.au  Fact Sheet for Healthcare Providers: IncredibleEmployment.be  This test is not yet approved or cleared by the [place] FDA and has been authorized for detection and/or diagnosis of SARS-CoV-2 by FDA under an Emergency Use  Authorization (EUA). This EUA will remain in effect (meaning this test can be used) for the duration of the COVID-19 declaration under Section 564(b)(1) of the Act, 21 U.S.C. section 360bbb-3(b)(1), unless the authorization is terminated or revoked.  Performed at [facility], [place] [address], [place], [place] [address]   MRSA Next Gen by PCR, Nasal     Status: None   Collection Time: 08/01/21  5:54 AM   Specimen: Nasal Mucosa; Nasal Swab  Result Value Ref Range Status   MRSA by PCR Next Gen NOT DETECTED NOT DETECTED Final    Comment: (NOTE) The GeneXpert MRSA Assay (FDA approved for NASAL specimens only), is one component of a comprehensive MRSA colonization surveillance program. It is not intended to diagnose MRSA infection nor to guide or monitor treatment for MRSA infections. Test performance is not FDA approved in patients less than 66 years old. Performed at [facility], [place] [address], [place], [place] [address]     RADIOLOGY STUDIES/RESULTS: DG Abd 1 View  Result Date: 08/04/2021 CLINICAL DATA:  NG tube placement. EXAM: ABDOMEN - 1 VIEW COMPARISON:  Abdominopelvic CT earlier today. FINDINGS: Tip of the enteric tube is below the diaphragm, the side port is in the region of the gastroesophageal junction. Recommend advancement of approximately 7 cm to place the side-port below the diaphragm. Mild gaseous gastric distension. IMPRESSION: Tip of the enteric tube below the diaphragm in the stomach, side-port in the region of the gastroesophageal junction. Recommend advancement of approximately 7 cm to place the side-port below the diaphragm. Electronically Signed   By: Keith Rake M.D.   On: 08/04/2021 21:05   MR LUMBAR SPINE WO CONTRAST  Result Date: 08/04/2021 CLINICAL DATA:  66 year old male with low back pain. Abdominal pain and ascites. EXAM: MRI LUMBAR SPINE WITHOUT CONTRAST TECHNIQUE: Multiplanar, multisequence MR imaging of the lumbar spine was performed. No  intravenous contrast was administered. COMPARISON:  CT Abdomen and Pelvis 06/27/2021. FINDINGS: Unfortunately, due to the presence of ascites diagnostic MRI of the lumbar spine sub for due to poor signal to noise acquisition,  despite imaging attempts on both 1.5 and 3.0 Tesla scanners. Subsequently, only sagittal T1 weighted imaging could be obtained. Lumbar segmentation appears to be normal and will be designated as such for this report. Diffusely heterogeneous T1 marrow signal throughout the visible spine and pelvis. Comparison to prior CT since last year demonstrates evidence of diffuse renal osteodystrophy with heterogeneous sclerosis. No destructive osseous lesion is identified. Grossly normal lower spinal cord and conus medullaris, which terminates at L1-L2. No evidence of lower thoracic or lumbar spinal stenosis. There is lumbar disc space loss at both L2-L3 and L5-S1, with vacuum disc there on the CT last month. Grossly negative lumbar paraspinal soft tissues. IMPRESSION: 1. Very limited exam, truncated to only sagittal T1 weighted imaging secondary to difficulty with diagnostic lumbar MR image quality due to the presence of Ascites. 2. No lower thoracic or lumbar spinal stenosis, despite chronic L2-L3 and L5-S1 disc degeneration. 3. Heterogeneous marrow signal throughout the visible spine and pelvis which seems to correlate to renal osteodystrophy on prior CTs. No destructive lumbar vertebral lesion. Electronically Signed   By: Genevie Ann M.D.   On: 08/04/2021 12:17   CT ABDOMEN PELVIS W CONTRAST  Result Date: 08/04/2021 CLINICAL DATA:  Nausea and vomiting. EXAM: CT ABDOMEN AND PELVIS WITH CONTRAST TECHNIQUE: Multidetector CT imaging of the abdomen and pelvis was performed using the standard protocol following bolus administration of intravenous contrast. RADIATION DOSE REDUCTION: This exam was performed according to the departmental dose-optimization program which includes automated exposure control,  adjustment of the mA and/or kV according to patient size and/or use of iterative reconstruction technique. CONTRAST:  158mL OMNIPAQUE IOHEXOL 350 MG/ML SOLN COMPARISON:  Abdominopelvic CT 06/27/2021 FINDINGS: Lower chest: Multi chamber cardiomegaly. Trace left pleural effusion and basilar atelectasis. Patient is leaning to the left. Hepatobiliary: No focal liver lesion. Mild hypertrophy of the left hepatic lobe. No convincing capsular nodularity. Pneumobilia with air in the common bile duct and central intrahepatic biliary tree. Distal common bile duct measures 10 mm. Cholecystectomy. Pancreas: No peripancreatic fat stranding. Prominent proximal pancreatic duct at 4 mm. No obvious pancreatic mass. Spleen: Atrophic and calcified. Adrenals/Urinary Tract: No adrenal nodule. No hydronephrosis. There are multiple bilateral renal cysts. Mild bilateral renal parenchymal atrophy. There is absent renal excretion on delayed phase imaging. Thick-walled urinary bladder. Stomach/Bowel: Detailed bowel assessment is limited in the absence of enteric contrast, paucity of abdominal fat, and presence of abdominopelvic ascites. The stomach is dilated and fluid-filled. Clips are seen within the anterior gastric lumen. Dependent hyperdensity in the stomach likely ingested pills. The duodenum and proximal small bowel are dilated and fluid-filled there are intermittently areas of decompressed small bowel with wall thickening in the region of small bowel dilatation. Area transition in the left mid abdomen related small bowel thickening. The more distal small bowel is decompressed. There are right lower quadrant chain sutures. Lateral to these chain sutures is a thick-walled fluid collection measuring 9.1 x 5.3 x 4.4 cm, series 3, image 58 and series 6, image 40. This contains non dependent air, with linear air tracking about the right lateral aspect into the right pelvis. This appears new from prior exam. The appendix is present in  contains enteric contrast. There is wall thickening of the ascending colon. Remainder of the colon is decompressed and not well assessed on the current exam. Questionable wall thickening involving the proximal sigmoid colon. There is stool distending the rectum. Vascular/Lymphatic: Moderate aortic atherosclerosis. Moderate branch atherosclerosis. Patent portal and splenic veins. No obvious abdominopelvic  adenopathy, assessment limited by ascites and paucity of abdominal fat. Reproductive: Unremarkable prostate. Other: Abdominopelvic ascites, small to moderate in degree, diminished from prior CT. There is a thick-walled fluid collection in the anterior pelvis as described above. Generalized edema of the mesenteric and subcutaneous fat. No pneumoperitoneum. Musculoskeletal: Heterogeneous diffuse osseous sclerosis may represent sequela of renal osteodystrophy or sickle cell. L5-S1 degenerative disc disease. No acute osseous findings are seen. IMPRESSION: 1. Findings suspicious for small-bowel obstruction with transition point in the left mid abdomen, possibly due to adhesions. 2. Thick-walled fluid collection in the anterior pelvis measuring 9.1 x 5.3 x 4.4 cm, suspicious for abscess. This is new from prior exam. Underlying etiology is indeterminate, this is adjacent to enteric sutures from prior bowel resection. 3. Pneumobilia with air in the common bile duct and central intrahepatic biliary tree, likely related to prior sphincterotomy. Mild dilatation of the distal common bile duct and proximal pancreatic duct without evidence of pancreatic mass. 4. Thick-walled urinary bladder, may be related to chronic renal disease or seen with cystitis. 5. Questionable wall thickening involving the proximal sigmoid colon. 6. Abdominopelvic ascites, small to moderate in degree, diminished from prior CT. 7. Multi chamber cardiomegaly. Trace left pleural effusion and basilar atelectasis. Aortic Atherosclerosis (ICD10-I70.0).  Electronically Signed   By: Keith Rake M.D.   On: 08/04/2021 18:58     LOS: 6 days   Little Ishikawa, DO  [facility]  To contact the attending provider between 7A-7P or the covering provider during after hours 7P-7A, please log into the web site [email or web address] and access using universal [place] password for that web site. If you do not have the password, please call the hospital operator.  08/06/2021, 7:15 AM

## 2021-08-07 ENCOUNTER — Inpatient Hospital Stay (HOSPITAL_COMMUNITY): Payer: HMO

## 2021-08-07 DIAGNOSIS — D649 Anemia, unspecified: Secondary | ICD-10-CM | POA: Diagnosis not present

## 2021-08-07 LAB — CBC
HCT: 23.1 % — ABNORMAL LOW (ref 39.0–52.0)
Hemoglobin: 8.1 g/dL — ABNORMAL LOW (ref 13.0–17.0)
MCH: 34.6 pg — ABNORMAL HIGH (ref 26.0–34.0)
MCHC: 35.1 g/dL (ref 30.0–36.0)
MCV: 98.7 fL (ref 80.0–100.0)
Platelets: 169 10*3/uL (ref 150–400)
RBC: 2.34 MIL/uL — ABNORMAL LOW (ref 4.22–5.81)
RDW: 22.7 % — ABNORMAL HIGH (ref 11.5–15.5)
WBC: 8.9 10*3/uL (ref 4.0–10.5)
nRBC: 1.5 % — ABNORMAL HIGH (ref 0.0–0.2)

## 2021-08-07 LAB — BASIC METABOLIC PANEL
Anion gap: 13 (ref 5–15)
BUN: 24 mg/dL — ABNORMAL HIGH (ref 8–23)
CO2: 25 mmol/L (ref 22–32)
Calcium: 8.8 mg/dL — ABNORMAL LOW (ref 8.9–10.3)
Chloride: 99 mmol/L (ref 98–111)
Creatinine, Ser: 3.63 mg/dL — ABNORMAL HIGH (ref 0.61–1.24)
GFR, Estimated: 18 mL/min — ABNORMAL LOW (ref >60)
Glucose, Bld: 75 mg/dL (ref 70–99)
Potassium: 4.3 mmol/L (ref 3.5–5.1)
Sodium: 137 mmol/L (ref 135–145)

## 2021-08-07 NOTE — Progress Notes (Signed)
[facility] Progress Note   Subjective:  Seen in room. NG tube clamped. S/p drain to lower abdominal abscess yesterday with IR. Eating ice/tolerating this morning. No CP/dyspnea. For HD tomorrow.  Objective Vitals:   08/06/21 2023 08/07/21 0400 08/07/21 0600 08/07/21 0753  BP: 112/73 109/74  104/72  Pulse:    80  Resp: (!) 22 14  13   Temp: 97.9 F (36.6 C) 98 F (36.7 C)  97.6 F (36.4 C)  TempSrc: Oral Oral  Oral  SpO2: 98% 98%  97%  Weight:   54.6 kg   Height:       Physical Exam General: Ill appearing man, room air. NG tube in place/clamped. Heart: RRR; no murmur Lungs: CTA anteriorly Abdomen: soft Extremities: No LE edema Dialysis Access:  RUE AVF + bruit  Additional Objective Labs: Basic Metabolic Panel: Recent Labs  Lab 08/01/21 0758 08/02/21 0143 08/04/21 0241 08/05/21 0345 08/07/21 0435  NA 128*   < > 136 134* 137  K 4.9   < > 3.1* 3.8 4.3  CL 90*   < > 99 98 99  CO2 23   < > 27 21* 25  GLUCOSE 72   < > 79 75 75  BUN 75*   < > 22 34* 24*  CREATININE 6.26*   < > 2.72* 3.73* 3.63*  CALCIUM 8.7*   < > 8.7* 8.8* 8.8*  PHOS 4.9*  --  3.3 3.9  --    < > = values in this interval not displayed.   Liver Function Tests: Recent Labs  Lab 07/31/21 1611 08/01/21 0758 08/04/21 0241 08/05/21 0345  AST 14*  --  10*  --   ALT 10  --  9  --   ALKPHOS 237*  --  180*  --   BILITOT 1.4*  --  1.5*  --   PROT 6.5  --  5.9*  --   ALBUMIN <1.5* <1.5* 1.5*   1.5* 1.6*   CBC: Recent Labs  Lab 08/03/21 0504 08/03/21 1838 08/04/21 0241 08/04/21 1649 08/05/21 0942 08/06/21 0219 08/07/21 0435  WBC 14.8*   < > 9.6 12.8* 12.5* 9.4 8.9  NEUTROABS 12.5*  --  7.6  --   --   --   --   HGB 8.4*   < > 7.8* 9.2* 7.3* 7.9* 8.1*  HCT 24.6*   < > 22.1* 27.3* 21.6* 23.1* 23.1*  MCV 96.5   < > 95.7 99.3 98.6 99.1 98.7  PLT 253   < > 208 PLATELET CLUMPS NOTED ON SMEAR, UNABLE TO ESTIMATE 210 193 169   < > = values in this interval not displayed.   Blood  Culture    Component Value Date/Time   SDES ABSCESS 08/06/2021 1110   SPECREQUEST NONE 08/06/2021 1110   CULT  08/06/2021 1110    FEW GRAM NEGATIVE RODS CULTURE REINCUBATED FOR BETTER GROWTH SUSCEPTIBILITIES TO FOLLOW Performed at [facility], [place] [address], [place], [place] [address]    REPTSTATUS PENDING 08/06/2021 1110   Studies/Results: DG Abd Portable 1V  Result Date: 08/07/2021 CLINICAL DATA:  Small bowel obstruction. EXAM: PORTABLE ABDOMEN - 1 VIEW COMPARISON:  08/04/2021 and CT abdomen 08/04/2021. FINDINGS: Nasogastric tube terminates in the stomach with the side port a few cm beyond the gastroesophageal junction. Placement of a percutaneous drainage catheter in the left anatomic pelvis. Gas is seen in nondilated small bowel and colon. Surgical clips in both upper quadrants. IMPRESSION: 1. Unremarkable bowel gas pattern.  2. Placement of a percutaneous drainage catheter in the left anatomic pelvis. Electronically Signed   By: Lorin Picket M.D.   On: 08/07/2021 08:14   CT IMAGE GUIDED DRAINAGE BY PERCUTANEOUS CATHETER  Result Date: 08/06/2021 INDICATION: 66 year old with bowel obstruction and intra-abdominal abscess. Plan for CT-guided drain placement. Prior abdominal surgery in December 2022. EXAM: CT-guided placement of lower abdominal/pelvic abscess drain MEDICATIONS: Moderate sedation ANESTHESIA/SEDATION: Moderate (conscious) sedation was employed during this procedure. A total of Versed 2.0mg  and fentanyl 100 mcg was administered intravenously at the order of the provider performing the procedure. Total intra-service moderate sedation time: 34 minutes. Patient's level of consciousness and vital signs were monitored continuously by radiology nurse throughout the procedure under the supervision of the provider performing the procedure. COMPLICATIONS: None immediate. PROCEDURE: Informed written consent was obtained from the patient after a thorough discussion of the procedural  risks, benefits and alternatives. All questions were addressed.A timeout was performed prior to the initiation of the procedure. Patient was placed supine on CT scanner. Images through the lower abdomen and pelvis were obtained. The anterior fluid collection was identified and targeted. The anterior abdomen was prepped with chlorhexidine and sterile field was created. Maximal barrier sterile technique was utilized including caps, mask, sterile gowns, sterile gloves, sterile drape, hand hygiene and skin antiseptic. Skin was anesthetized using 1% lidocaine. Small incision was made. Using CT guidance, an 18 gauge trocar needle was directed into the anterior fluid collection. Gray purulent fluid was aspirated. Superstiff Amplatz wire was advanced into the collection and the tract was dilated to accommodate a 10 [place] multipurpose drain. 30 mL of fluid was removed. Drain was flushed with saline and attached to a suction bulb. Drain was sutured to skin. Dressing was placed. FINDINGS: Thick wall fluid collection containing a small amount of gas in the anterior lower abdomen and upper pelvis. 10 French drain was placed and 30 mL of gray purulent-looking fluid was removed. IMPRESSION: CT-guided placement of a drain in the lower abdominal/pelvic abscess. Electronically Signed   By: Markus Daft M.D.   On: 08/06/2021 12:29    Medications:  sodium chloride     sodium chloride     sodium chloride     piperacillin-tazobactam (ZOSYN)  IV 2.25 g (08/07/21 0506)   promethazine (PHENERGAN) injection (IM or IVPB) Stopped (08/02/21 0135)    (feeding supplement) PROSource Plus  30 mL Oral BID BM   allopurinol  100 mg Oral Daily   Chlorhexidine Gluconate Cloth  6 each Topical Q0600   darbepoetin (ARANESP) injection - DIALYSIS  200 mcg Intravenous Q Sat-HD   ferric citrate  420 mg Oral TID with meals   folic acid  1 mg Oral Daily   hydroxyurea  500 mg Oral Daily   pantoprazole (PROTONIX) IV  40 mg Intravenous Q12H    polyethylene glycol  17 g Oral Daily   senna-docusate  2 tablet Oral QHS   sodium chloride flush  3 mL Intravenous Q12H    Dialysis Orders: TTS - AF 4hrs, 400/500,  EDW 55.5kg, 3K/ 2Ca UFP2  RU AVF no heparin - Mircera 225 mcg q2wks - last 07/20/21 - Hectorol 21mcg IV qHD   - Sensipar 30mg  qHD   Assessment/Plan:  SBO with Abdominal abscess: NG tube in place, but clamped. S/p abscess drain placement 2/19 -> Cx pending, growing GPC and GNR for now -> started on IV ZOsyn. Symptomatic acute on chronic anemia of CKD - Hx recurrent GIB. Hgb 5.2 on admit s/p 2  unit pRBC improved to 7.6, then drop to 6.6 post HD.  Given another 1U PRBCs on 2/14. Hgb now stable - 8.1 today. Continues Aranesp 275mcg q Saturday.  ESRD: Continue HD per usual TTS schedule - HD tomorrow. Hyponatremia: On admit, resolved. Hypokalemia: Resolved, using higher K bath with HD.  Hypotension/volume: BP runs soft.  Avoiding midodrine with prolonged QT. Secondary Hyperparathyroidism: Corrected calcium is high, holding VDRA.  Phos ok, continue binders and restart sensipar once tolerating PO. Severe protein calorie malnutrition: Slowly advancing diet, protein supplements ordered. A fib - not on eliquis d/t GIB Sickle cell anemia - chronic, having back pain this admission     Veneta Penton, PA-C 08/07/2021, 11:21 AM  [facility]

## 2021-08-07 NOTE — Progress Notes (Signed)
°  Indications: Intra-abdominal abscess.  Abdominal surgery on 05/24/21 Procedure: CT guided placement of lower abdominal / pelvic abscess drain   Drain Location: LLQ Size: Fr size: 10 Fr Date of placement: 08/06/21  Currently to: Drain collection device: suction bulb 24 hour output:  Output by Drain (mL) 08/05/21 0701 - 08/05/21 1900 08/05/21 1901 - 08/06/21 0700 08/06/21 0701 - 08/06/21 1900 08/06/21 1901 - 08/07/21 0700 08/07/21 0701 - 08/07/21 0740  Closed System Drain 1 Groin Bulb (JP) 10 Fr.   30 25     Interval imaging/drain manipulation:  None  Current examination: Flushes/aspirates easily.  Insertion site unremarkable.; clean and dry Suture and stat lock in place. Dressed appropriately.   Plan: Continue TID flushes with 5 cc NS. Record output Q shift. Dressing changes QD or PRN if soiled.  Call IR APP or on call IR MD if difficulty flushing or sudden change in drain output.  Repeat imaging/possible drain injection once output < 10 mL/QD (excluding flush material.)  Discharge planning: Please contact IR APP or on call IR MD prior to patient d/c to ensure appropriate follow up plans are in place. Typically patient will follow up with IR clinic 10-14 days post d/c for repeat imaging/possible drain injection. IR scheduler will contact patient with date/time of appointment. Patient will need to flush drain QD with 5 cc NS, record output QD, dressing changes every 2-3 days or earlier if soiled.   IR will continue to follow - please call with questions or concerns.

## 2021-08-07 NOTE — Progress Notes (Signed)
PROGRESS NOTE        PATIENT DETAILS Name: Patrick [name] Age: 66 y.o. Sex: male Date of Birth: [birth year] Admit Date: 07/31/2021 Admitting Physician Eben Burow, MD EXM:[id number], Asencion Partridge, FNP  Brief Summary: 66 year old male with history of ESRD on HD, sickle cell disease, permanent atrial fibrillation, history of UGI due to AVMs, recent history (December 2022) of choledocholithiasis and ischemic small bowel requiring cholecystectomy/biliary stent placement/bowel resection-referred to the ED from HD center for hemoglobin of 5.8-subsequent EGD on 2/15 showed bleeding AVMs.  Hospital course complicated by persistent nausea and vomiting-see below for further details.  Significant events: 2/13>> referred to the ED from HD center for evaluation of hemoglobin of 5.8.   2/17>> ?SBO/Abscess on CT abdomen/pelvis -General surgery consulted 2/19>> percutaneous pelvic drain placed; worsening chronic back pain 2/20>> back pain moderately improving on current regimen, minimal NG output, scant pelvic drain output overnight  Significant imaging studies: 2/17>> MRI LS spine: Limited exams-no acute abnormalities. 2/17>> CT abd/pelvis: Questionable SBO and abcess  Significant microbiology data: 2/13>> COVID/influenza PCR: Negative  Procedures: 2/15>> EGD: Multiple bleeding angiectasia's in the stomach treated with argon plasma coagulation 2/19>> Pelvic fluid drain placed, 10 [place]  Consults: GI, nephrology, Gen Sx  Subjective: No acute issues or events overnight, NG tube with minimal output, pelvic drainage tube scant output otherwise denies chest pain nausea vomiting shortness of breath or diarrhea  Objective: Vitals: Blood pressure 109/74, pulse 76, temperature 98 F (36.7 C), temperature source Oral, resp. rate 14, height 5\' 11"  (1.803 m), weight 54.6 kg, SpO2 98 %.   Exam: General:  Pleasantly resting in bed, No acute distress. HEENT:  NG tube with  minimal output Neck:  Without mass or deformity.  Trachea is midline. Lungs:  Clear to auscultate bilaterally without rhonchi, wheeze, or rales. Heart:  Regular rate and rhythm.  Without murmurs, rubs, or gallops. Abdomen:  Soft, nontender, nondistended.  Abdominal bandage clean dry intact, pelvic drain minimal output Extremities: Without cyanosis, clubbing, edema, or obvious deformity.    Pertinent Labs/Radiology: CBC Latest Ref Rng & Units 08/07/2021 08/06/2021 08/05/2021  WBC 4.0 - 10.5 K/uL 8.9 9.4 12.5(H)  Hemoglobin 13.0 - 17.0 g/dL 8.1(L) 7.9(L) 7.3(L)  Hematocrit 39.0 - 52.0 % 23.1(L) 23.1(L) 21.6(L)  Platelets 150 - 400 K/uL 169 193 210    Lab Results  Component Value Date   NA 137 08/07/2021   K 4.3 08/07/2021   CL 99 08/07/2021   CO2 25 08/07/2021      Assessment/Plan:  Questionable SBO/Abdominal fluid collection/abscess Nausea/vomiting improving:  -General surgery evaluated recommending IR evaluation and drain placement -Drain placed 08/06/2021 with gray purulent fluid withdrawn, follow labs -cultures pulmonary show gram-negative rods, re-incubated for culture  Upper GI bleeding with acute blood loss anemia:  - EGD on 2/15 showed bleeding AVMs-hemoglobin has stabilized after 3 units of PRBC transfusion so far -No further signs or symptoms of bleeding -Hemoglobin stabilizing, upward trend x3 days, follow repeat labs  Recent history of choledocholithiasis-s/p ERCP with pancreatic/biliary stents (removed) followed by open cholecystectomy and resection of ischemic small bowel on 05/24/2021 -Stable, follow clinically -Follow fluid drainage as above to ensure no biliary leak  ESRD: On HD per nephrology.  Chronic HFrEF (EF 40-45% by echo December 2022):  -Volume status stable-volume removal with HD.  Permanent atrial fibrillation:  -Rate controlled with Coreg-not a candidate  for anticoagulation due to recurrent GI bleeding.  HTN: BP stable-not on any  antihypertensives.  Sickle cell disease Lumbago Pain somewhat worse over the past 24 hours, increase both p.o. and IV narcotics Hemoglobin fluctuating between 7-8.  BMI: Estimated body mass index is 16.79 kg/m as calculated from the following:   Height as of this encounter: 5\' 11"  (1.803 m).   Weight as of this encounter: 54.6 kg.   Code status:   Code Status: Full Code   DVT Prophylaxis: SCDs Start: 07/31/21 2024   Family Communication: None at bedside  Disposition Plan: Status is: Inpatient Remains inpatient appropriate because: Upper GI bleeding-s/p EGD with APC of bleeding AVMs-now with persistent nausea/vomiting with questionable SBO and pelvic fluid collection.  Work-up in progress.  Planned Discharge Destination:Home   Diet: Diet Order             Diet NPO time specified  Diet effective now                   Antimicrobial agents: Anti-infectives (From admission, onward)    Start     Dose/Rate Route Frequency Ordered Stop   08/05/21 2200  piperacillin-tazobactam (ZOSYN) IVPB 2.25 g        2.25 g 100 mL/hr over 30 Minutes Intravenous Every 8 hours 08/05/21 1455     08/04/21 2200  piperacillin-tazobactam (ZOSYN) IVPB 3.375 g  Status:  Discontinued        3.375 g 12.5 mL/hr over 240 Minutes Intravenous Every 8 hours 08/04/21 1927 08/05/21 1455        MEDICATIONS: Scheduled Meds:  (feeding supplement) PROSource Plus  30 mL Oral BID BM   allopurinol  100 mg Oral Daily   Chlorhexidine Gluconate Cloth  6 each Topical Q0600   darbepoetin (ARANESP) injection - DIALYSIS  200 mcg Intravenous Q Sat-HD   ferric citrate  420 mg Oral TID with meals   folic acid  1 mg Oral Daily   hydroxyurea  500 mg Oral Daily   pantoprazole (PROTONIX) IV  40 mg Intravenous Q12H   polyethylene glycol  17 g Oral Daily   senna-docusate  2 tablet Oral QHS   sodium chloride flush  3 mL Intravenous Q12H   Continuous Infusions:  sodium chloride     sodium chloride     sodium  chloride     piperacillin-tazobactam (ZOSYN)  IV 2.25 g (08/07/21 0506)   promethazine (PHENERGAN) injection (IM or IVPB) Stopped (08/02/21 0135)   PRN Meds:.sodium chloride, sodium chloride, acetaminophen **OR** acetaminophen, heparin, HYDROmorphone (DILAUDID) injection, lidocaine (PF), lidocaine-prilocaine, ondansetron **OR** ondansetron (ZOFRAN) IV, oxyCODONE, pentafluoroprop-tetrafluoroeth, promethazine (PHENERGAN) injection (IM or IVPB), sodium chloride flush   I have personally reviewed following labs and imaging studies  LABORATORY DATA: CBC: Recent Labs  Lab 08/03/21 0504 08/03/21 1838 08/04/21 0241 08/04/21 1649 08/05/21 0942 08/06/21 0219 08/07/21 0435  WBC 14.8*   < > 9.6 12.8* 12.5* 9.4 8.9  NEUTROABS 12.5*  --  7.6  --   --   --   --   HGB 8.4*   < > 7.8* 9.2* 7.3* 7.9* 8.1*  HCT 24.6*   < > 22.1* 27.3* 21.6* 23.1* 23.1*  MCV 96.5   < > 95.7 99.3 98.6 99.1 98.7  PLT 253   < > 208 PLATELET CLUMPS NOTED ON SMEAR, UNABLE TO ESTIMATE 210 193 169   < > = values in this interval not displayed.     Basic Metabolic Panel: Recent Labs  Lab 07/31/21 1611  07/31/21 1715 08/01/21 0758 08/02/21 0143 08/04/21 0241 08/05/21 0345 08/07/21 0435  NA 129*   < > 128* 136 136 134* 137  K 4.2   < > 4.9 4.1 3.1* 3.8 4.3  CL 90*   < > 90* 98 99 98 99  CO2 24   < > 23 23 27  21* 25  GLUCOSE 84   < > 72 65* 79 75 75  BUN 67*   < > 75* 28* 22 34* 24*  CREATININE 6.16*   < > 6.26* 2.93* 2.72* 3.73* 3.63*  CALCIUM 8.7*   < > 8.7* 8.5* 8.7* 8.8* 8.8*  MG 1.5*  --   --   --   --   --   --   PHOS  --   --  4.9*  --  3.3 3.9  --    < > = values in this interval not displayed.     GFR: Estimated Creatinine Clearance: 16.1 mL/min (A) (by C-G formula based on SCr of 3.63 mg/dL (H)).  Liver Function Tests: Recent Labs  Lab 07/31/21 1611 08/01/21 0758 08/04/21 0241 08/05/21 0345  AST 14*  --  10*  --   ALT 10  --  9  --   ALKPHOS 237*  --  180*  --   BILITOT 1.4*  --  1.5*  --    PROT 6.5  --  5.9*  --   ALBUMIN <1.5* <1.5* 1.5*   1.5* 1.6*    No results for input(s): LIPASE, AMYLASE in the last 168 hours. No results for input(s): AMMONIA in the last 168 hours.  Coagulation Profile: Recent Labs  Lab 08/06/21 0219  INR 1.4*     Cardiac Enzymes: No results for input(s): CKTOTAL, CKMB, CKMBINDEX, TROPONINI in the last 168 hours.  BNP (last 3 results) No results for input(s): PROBNP in the last 8760 hours.  Lipid Profile: No results for input(s): CHOL, HDL, LDLCALC, TRIG, CHOLHDL, LDLDIRECT in the last 72 hours.  Thyroid Function Tests: No results for input(s): TSH, T4TOTAL, FREET4, T3FREE, THYROIDAB in the last 72 hours.  Anemia Panel: No results for input(s): VITAMINB12, FOLATE, FERRITIN, TIBC, IRON, RETICCTPCT in the last 72 hours.  Urine analysis:    Component Value Date/Time   COLORURINE STRAW (A) 06/02/2018 1900   APPEARANCEUR CLEAR 06/02/2018 1900   APPEARANCEUR Clear 05/31/2017 0902   LABSPEC 1.006 06/02/2018 1900   PHURINE 7.0 06/02/2018 1900   GLUCOSEU NEGATIVE 06/02/2018 1900   HGBUR SMALL (A) 06/02/2018 1900   BILIRUBINUR neg 01/26/2020 1124   BILIRUBINUR Negative 05/31/2017 0902   KETONESUR negative 09/08/2018 1621   KETONESUR NEGATIVE 06/02/2018 1900   PROTEINUR Positive (A) 01/26/2020 1124   PROTEINUR 30 (A) 06/02/2018 1900   UROBILINOGEN 1.0 01/26/2020 1124   UROBILINOGEN 0.2 09/14/2013 1354   NITRITE neg 01/26/2020 1124   NITRITE NEGATIVE 06/02/2018 1900   LEUKOCYTESUR Negative 01/26/2020 1124   LEUKOCYTESUR Negative 05/31/2017 0902    Sepsis Labs: Lactic Acid, Venous    Component Value Date/Time   LATICACIDVEN 1.3 06/27/2021 0830    MICROBIOLOGY: Recent Results (from the past 240 hour(s))  Resp Panel by RT-PCR (Flu A&B, Covid) Nasopharyngeal Swab     Status: None   Collection Time: 07/31/21 12:14 AM   Specimen: Nasopharyngeal Swab; Nasopharyngeal(NP) swabs in vial transport medium  Result Value Ref Range  Status   SARS Coronavirus 2 by RT PCR NEGATIVE NEGATIVE Final    Comment: (NOTE) SARS-CoV-2 target nucleic acids are NOT DETECTED.  The SARS-CoV-2 RNA is generally detectable in upper respiratory specimens during the acute phase of infection. The lowest concentration of SARS-CoV-2 viral copies this assay can detect is 138 copies/mL. A negative result does not preclude SARS-Cov-2 infection and should not be used as the sole basis for treatment or other patient management decisions. A negative result may occur with  improper specimen collection/handling, submission of specimen other than nasopharyngeal swab, presence of viral mutation(s) within the areas targeted by this assay, and inadequate number of viral copies(<138 copies/mL). A negative result must be combined with clinical observations, patient history, and epidemiological information. The expected result is Negative.  Fact Sheet for Patients:  EntrepreneurPulse.com.au  Fact Sheet for Healthcare Providers:  IncredibleEmployment.be  This test is no t yet approved or cleared by the [place] FDA and  has been authorized for detection and/or diagnosis of SARS-CoV-2 by FDA under an Emergency Use Authorization (EUA). This EUA will remain  in effect (meaning this test can be used) for the duration of the COVID-19 declaration under Section 564(b)(1) of the Act, 21 U.S.C.section 360bbb-3(b)(1), unless the authorization is terminated  or revoked sooner.       Influenza A by PCR NEGATIVE NEGATIVE Final   Influenza B by PCR NEGATIVE NEGATIVE Final    Comment: (NOTE) The Xpert Xpress SARS-CoV-2/FLU/RSV plus assay is intended as an aid in the diagnosis of influenza from Nasopharyngeal swab specimens and should not be used as a sole basis for treatment. Nasal washings and aspirates are unacceptable for Xpert Xpress SARS-CoV-2/FLU/RSV testing.  Fact Sheet for  Patients: EntrepreneurPulse.com.au  Fact Sheet for Healthcare Providers: IncredibleEmployment.be  This test is not yet approved or cleared by the [place] FDA and has been authorized for detection and/or diagnosis of SARS-CoV-2 by FDA under an Emergency Use Authorization (EUA). This EUA will remain in effect (meaning this test can be used) for the duration of the COVID-19 declaration under Section 564(b)(1) of the Act, 21 U.S.C. section 360bbb-3(b)(1), unless the authorization is terminated or revoked.  Performed at [facility], [place] [address], [place], [place] [address]   MRSA Next Gen by PCR, Nasal     Status: None   Collection Time: 08/01/21  5:54 AM   Specimen: Nasal Mucosa; Nasal Swab  Result Value Ref Range Status   MRSA by PCR Next Gen NOT DETECTED NOT DETECTED Final    Comment: (NOTE) The GeneXpert MRSA Assay (FDA approved for NASAL specimens only), is one component of a comprehensive MRSA colonization surveillance program. It is not intended to diagnose MRSA infection nor to guide or monitor treatment for MRSA infections. Test performance is not FDA approved in patients less than 47 years old. Performed at [facility], [place] [address], [place], [place] [address]   Aerobic/Anaerobic Culture w Gram Stain (surgical/deep wound)     Status: None (Preliminary result)   Collection Time: 08/06/21 11:10 AM   Specimen: Abscess  Result Value Ref Range Status   Specimen Description ABSCESS  Final   Special Requests NONE  Final   Gram Stain   Final    ABUNDANT WBC PRESENT, PREDOMINANTLY PMN ABUNDANT GRAM NEGATIVE RODS RARE GRAM POSITIVE COCCI Performed at [facility], [place] [address], [place], [place] [address]    Culture PENDING  Incomplete   Report Status PENDING  Incomplete    RADIOLOGY STUDIES/RESULTS: CT IMAGE GUIDED DRAINAGE BY PERCUTANEOUS CATHETER  Result Date: 08/06/2021 INDICATION: 66 year old with  bowel obstruction and intra-abdominal abscess. Plan for  CT-guided drain placement. Prior abdominal surgery in December 2022. EXAM: CT-guided placement of lower abdominal/pelvic abscess drain MEDICATIONS: Moderate sedation ANESTHESIA/SEDATION: Moderate (conscious) sedation was employed during this procedure. A total of Versed 2.0mg  and fentanyl 100 mcg was administered intravenously at the order of the provider performing the procedure. Total intra-service moderate sedation time: 34 minutes. Patient's level of consciousness and vital signs were monitored continuously by radiology nurse throughout the procedure under the supervision of the provider performing the procedure. COMPLICATIONS: None immediate. PROCEDURE: Informed written consent was obtained from the patient after a thorough discussion of the procedural risks, benefits and alternatives. All questions were addressed.A timeout was performed prior to the initiation of the procedure. Patient was placed supine on CT scanner. Images through the lower abdomen and pelvis were obtained. The anterior fluid collection was identified and targeted. The anterior abdomen was prepped with chlorhexidine and sterile field was created. Maximal barrier sterile technique was utilized including caps, mask, sterile gowns, sterile gloves, sterile drape, hand hygiene and skin antiseptic. Skin was anesthetized using 1% lidocaine. Small incision was made. Using CT guidance, an 18 gauge trocar needle was directed into the anterior fluid collection. Gray purulent fluid was aspirated. Superstiff Amplatz wire was advanced into the collection and the tract was dilated to accommodate a 10 [place] multipurpose drain. 30 mL of fluid was removed. Drain was flushed with saline and attached to a suction bulb. Drain was sutured to skin. Dressing was placed. FINDINGS: Thick wall fluid collection containing a small amount of gas in the anterior lower abdomen and upper pelvis. 10 French drain was  placed and 30 mL of gray purulent-looking fluid was removed. IMPRESSION: CT-guided placement of a drain in the lower abdominal/pelvic abscess. Electronically Signed   By: Markus Daft M.D.   On: 08/06/2021 12:29     LOS: 7 days   Little Ishikawa, DO  [facility]  To contact the attending provider between 7A-7P or the covering provider during after hours 7P-7A, please log into the web site [email or web address] and access using universal  password for that web site. If you do not have the password, please call the hospital operator.  08/07/2021, 7:25 AM

## 2021-08-07 NOTE — Progress Notes (Signed)
Pt receives out-pt HD at [facility] SW on TTS. Pt arrives at 5:30 for 5:45 chair time. Will assist as needed.   Melven Sartorius Renal Navigator [phone]

## 2021-08-07 NOTE — Progress Notes (Signed)
5 Days Post-Op  Subjective: Having some flatus and a small BM this morning.  NGT in place with 200cc documented yesterday.  Drain in place  ROS: See above, otherwise other systems negative  Objective: Vital signs in last 24 hours: Temp:  [97.6 F (36.4 C)-98 F (36.7 C)] 97.6 F (36.4 C) (02/20 0753) Pulse Rate:  [75-83] 80 (02/20 0753) Resp:  [12-22] 13 (02/20 0753) BP: (104-121)/(65-86) 104/72 (02/20 0753) SpO2:  [97 %-100 %] 97 % (02/20 0753) Weight:  [54.6 kg] 54.6 kg (02/20 0600) Last BM Date : 08/06/21  Intake/Output from previous day: 02/19 0701 - 02/20 0700 In: 100 [IV Piggyback:100] Out: 655 [Urine:400; Emesis/NG output:200; Drains:55] Intake/Output this shift: No intake/output data recorded.  PE: Gen: NAD, laying in bed Abd: soft, NT, ND, midline wound almost completely healed.  Small area of scabbing still present.  NGT in place with bilious output, but appears to only have had 200cc/24hrs.  New IR drain in place with some tan purulent output mixed with serous fluid   Lab Results:  Recent Labs    08/06/21 0219 08/07/21 0435  WBC 9.4 8.9  HGB 7.9* 8.1*  HCT 23.1* 23.1*  PLT 193 169   BMET Recent Labs    08/05/21 0345 08/07/21 0435  NA 134* 137  K 3.8 4.3  CL 98 99  CO2 21* 25  GLUCOSE 75 75  BUN 34* 24*  CREATININE 3.73* 3.63*  CALCIUM 8.8* 8.8*   PT/INR Recent Labs    08/06/21 0219  LABPROT 17.5*  INR 1.4*   CMP     Component Value Date/Time   NA 137 08/07/2021 0435   NA 135 05/24/2020 1119   K 4.3 08/07/2021 0435   CL 99 08/07/2021 0435   CO2 25 08/07/2021 0435   GLUCOSE 75 08/07/2021 0435   BUN 24 (H) 08/07/2021 0435   BUN 25 05/24/2020 1119   CREATININE 3.63 (H) 08/07/2021 0435   CREATININE 2.90 (H) 08/11/2015 1359   CALCIUM 8.8 (L) 08/07/2021 0435   PROT 5.9 (L) 08/04/2021 0241   PROT 7.5 05/24/2020 1119   ALBUMIN 1.6 (L) 08/05/2021 0345   ALBUMIN 4.1 05/24/2020 1119   AST 10 (L) 08/04/2021 0241   ALT 9 08/04/2021  0241   ALKPHOS 180 (H) 08/04/2021 0241   BILITOT 1.5 (H) 08/04/2021 0241   BILITOT 2.4 (H) 05/24/2020 1119   GFRNONAA 18 (L) 08/07/2021 0435   GFRNONAA 23 (L) 08/11/2015 1359   GFRAA 24 (L) 05/24/2020 1119   GFRAA 27 (L) 08/11/2015 1359   Lipase     Component Value Date/Time   LIPASE 30 06/27/2021 0830       Studies/Results: DG Abd Portable 1V  Result Date: 08/07/2021 CLINICAL DATA:  Small bowel obstruction. EXAM: PORTABLE ABDOMEN - 1 VIEW COMPARISON:  08/04/2021 and CT abdomen 08/04/2021. FINDINGS: Nasogastric tube terminates in the stomach with the side port a few cm beyond the gastroesophageal junction. Placement of a percutaneous drainage catheter in the left anatomic pelvis. Gas is seen in nondilated small bowel and colon. Surgical clips in both upper quadrants. IMPRESSION: 1. Unremarkable bowel gas pattern. 2. Placement of a percutaneous drainage catheter in the left anatomic pelvis. Electronically Signed   By: Lorin Picket M.D.   On: 08/07/2021 08:14   CT IMAGE GUIDED DRAINAGE BY PERCUTANEOUS CATHETER  Result Date: 08/06/2021 INDICATION: 66 year old with bowel obstruction and intra-abdominal abscess. Plan for CT-guided drain placement. Prior abdominal surgery in December 2022. EXAM: CT-guided placement of lower  abdominal/pelvic abscess drain MEDICATIONS: Moderate sedation ANESTHESIA/SEDATION: Moderate (conscious) sedation was employed during this procedure. A total of Versed 2.0mg  and fentanyl 100 mcg was administered intravenously at the order of the provider performing the procedure. Total intra-service moderate sedation time: 34 minutes. Patient's level of consciousness and vital signs were monitored continuously by radiology nurse throughout the procedure under the supervision of the provider performing the procedure. COMPLICATIONS: None immediate. PROCEDURE: Informed written consent was obtained from the patient after a thorough discussion of the procedural risks, benefits and  alternatives. All questions were addressed.A timeout was performed prior to the initiation of the procedure. Patient was placed supine on CT scanner. Images through the lower abdomen and pelvis were obtained. The anterior fluid collection was identified and targeted. The anterior abdomen was prepped with chlorhexidine and sterile field was created. Maximal barrier sterile technique was utilized including caps, mask, sterile gowns, sterile gloves, sterile drape, hand hygiene and skin antiseptic. Skin was anesthetized using 1% lidocaine. Small incision was made. Using CT guidance, an 18 gauge trocar needle was directed into the anterior fluid collection. Gray purulent fluid was aspirated. Superstiff Amplatz wire was advanced into the collection and the tract was dilated to accommodate a 10 [place] multipurpose drain. 30 mL of fluid was removed. Drain was flushed with saline and attached to a suction bulb. Drain was sutured to skin. Dressing was placed. FINDINGS: Thick wall fluid collection containing a small amount of gas in the anterior lower abdomen and upper pelvis. 10 French drain was placed and 30 mL of gray purulent-looking fluid was removed. IMPRESSION: CT-guided placement of a drain in the lower abdominal/pelvic abscess. Electronically Signed   By: Markus Daft M.D.   On: 08/06/2021 12:29    Anti-infectives: Anti-infectives (From admission, onward)    Start     Dose/Rate Route Frequency Ordered Stop   08/05/21 2200  piperacillin-tazobactam (ZOSYN) IVPB 2.25 g        2.25 g 100 mL/hr over 30 Minutes Intravenous Every 8 hours 08/05/21 1455     08/04/21 2200  piperacillin-tazobactam (ZOSYN) IVPB 3.375 g  Status:  Discontinued        3.375 g 12.5 mL/hr over 240 Minutes Intravenous Every 8 hours 08/04/21 1927 08/05/21 1455        Assessment/Plan 2 months s/p ex lap with cholecystectomy, SBR, now with recurrent SBO and intra-abdominal fluid collection -drain placement by IR yesterday.  Cultures  pending but with gram + and Gram - present -SBO vs ileus seems to be improving and film looks better today and clinically looks better -clamp NGT and given clears from floor.  Plan to DC in am if tolerates today -mobilize as able  FEN - sips of clears from floor/NGT clamped/IVFs VTE - on hold due to UGI bleed ID - zosyn  UGI bleed, see above ESRD on HD CHF Severe MR, TR A fib HTN Sickle Cell disease Anemia   LOS: 7 days    Henreitta Cea , [facility] Surgery 08/07/2021, 9:01 AM Please see Amion for pager number during day hours 7:00am-4:30pm or 7:00am -11:30am on weekends

## 2021-08-07 NOTE — Care Management Important Message (Signed)
Important Message  Patient Details  Name: Patrick [name] MRN: [id number] Date of Birth: 10-Apr-1956   Medicare Important Message Given:  Yes     [name] 08/07/2021, 2:13 PM

## 2021-08-07 NOTE — Progress Notes (Signed)
Pharmacy Antibiotic Note  Patrick [name] is a 66 y.o. male admitted on 07/31/2021 with  Intra-abdominal infection .  Pharmacy has been consulted for Zosyn dosing.  ID: Intra-abdominal infection (see CT) - Afebrile, WBC WNL  2/17- Zosyn >>  2/14- MRSA PCR >> negative 2/13>> COVID/influenza PCR: Negative  Plan: Zosyn 2.25g IV q8hr for ESRD Pharmacy will sign off. Please reconsult for further dosing assitance.   Height: 5\' 11"  (180.3 cm) Weight: 54.6 kg (120 lb 5.9 oz) IBW/kg (Calculated) : 75.3  Temp (24hrs), Avg:97.8 F (36.6 C), Min:97.6 F (36.4 C), Max:98 F (36.7 C)  Recent Labs  Lab 08/01/21 0758 08/02/21 0143 08/03/21 0504 08/04/21 0241 08/04/21 1649 08/05/21 0345 08/05/21 0942 08/06/21 0219 08/07/21 0435  WBC 11.6*  --    < > 9.6 12.8*  --  12.5* 9.4 8.9  CREATININE 6.26* 2.93*  --  2.72*  --  3.73*  --   --  3.63*   < > = values in this interval not displayed.    Estimated Creatinine Clearance: 16.1 mL/min (A) (by C-G formula based on SCr of 3.63 mg/dL (H)).    No Known Allergies   Finnleigh Marchetti S. Alford Highland, PharmD, BCPS Clinical Staff Pharmacist [email or web address] [place] 08/07/2021 9:38 AM

## 2021-08-08 DIAGNOSIS — I10 Essential (primary) hypertension: Secondary | ICD-10-CM | POA: Diagnosis not present

## 2021-08-08 DIAGNOSIS — I5022 Chronic systolic (congestive) heart failure: Secondary | ICD-10-CM | POA: Diagnosis not present

## 2021-08-08 DIAGNOSIS — N186 End stage renal disease: Secondary | ICD-10-CM | POA: Diagnosis not present

## 2021-08-08 DIAGNOSIS — D62 Acute posthemorrhagic anemia: Secondary | ICD-10-CM | POA: Diagnosis not present

## 2021-08-08 LAB — CBC
HCT: 22.2 % — ABNORMAL LOW (ref 39.0–52.0)
Hemoglobin: 7.8 g/dL — ABNORMAL LOW (ref 13.0–17.0)
MCH: 34.5 pg — ABNORMAL HIGH (ref 26.0–34.0)
MCHC: 35.1 g/dL (ref 30.0–36.0)
MCV: 98.2 fL (ref 80.0–100.0)
Platelets: 154 10*3/uL (ref 150–400)
RBC: 2.26 MIL/uL — ABNORMAL LOW (ref 4.22–5.81)
RDW: 22.7 % — ABNORMAL HIGH (ref 11.5–15.5)
WBC: 6.9 10*3/uL (ref 4.0–10.5)
nRBC: 2.2 % — ABNORMAL HIGH (ref 0.0–0.2)

## 2021-08-08 LAB — RENAL FUNCTION PANEL
Albumin: 1.6 g/dL — ABNORMAL LOW (ref 3.5–5.0)
Anion gap: 12 (ref 5–15)
BUN: 29 mg/dL — ABNORMAL HIGH (ref 8–23)
CO2: 26 mmol/L (ref 22–32)
Calcium: 8.7 mg/dL — ABNORMAL LOW (ref 8.9–10.3)
Chloride: 98 mmol/L (ref 98–111)
Creatinine, Ser: 4.8 mg/dL — ABNORMAL HIGH (ref 0.61–1.24)
GFR, Estimated: 13 mL/min — ABNORMAL LOW (ref >60)
Glucose, Bld: 70 mg/dL (ref 70–99)
Phosphorus: 4.3 mg/dL (ref 2.5–4.6)
Potassium: 3.1 mmol/L — ABNORMAL LOW (ref 3.5–5.1)
Sodium: 136 mmol/L (ref 135–145)

## 2021-08-08 MED ORDER — LIDOCAINE-PRILOCAINE 2.5-2.5 % EX CREA
1.0000 "application " | TOPICAL_CREAM | CUTANEOUS | Status: DC | PRN
  Filled 2021-08-08: qty 5

## 2021-08-08 MED ORDER — SODIUM CHLORIDE 0.9 % IV SOLN: 100.0000 mL | INTRAVENOUS | Status: DC | PRN

## 2021-08-08 MED ORDER — HEPARIN SODIUM (PORCINE) 1000 UNIT/ML DIALYSIS: 1000.0000 [IU] | INTRAMUSCULAR | Status: DC | PRN

## 2021-08-08 MED ORDER — ALTEPLASE 2 MG IJ SOLR: 2.0000 mg | Freq: Once | INTRAMUSCULAR | Status: DC | PRN

## 2021-08-08 MED ORDER — PENTAFLUOROPROP-TETRAFLUOROETH EX AERO: 1.0000 "application " | INHALATION_SPRAY | CUTANEOUS | Status: DC | PRN

## 2021-08-08 MED ORDER — SODIUM CHLORIDE 0.9% FLUSH
5.0000 mL | Freq: Three times a day (TID) | INTRAVENOUS | Status: DC
  Administered 2021-08-08 – 2021-08-11 (×8): 5 mL

## 2021-08-08 MED ORDER — LIDOCAINE HCL (PF) 1 % IJ SOLN
5.0000 mL | INTRAMUSCULAR | Status: DC | PRN
  Filled 2021-08-08: qty 5

## 2021-08-08 NOTE — Progress Notes (Signed)
[facility] Progress Note   Subjective:  Seen on HD - 1L UFG and tolerating. NG tube out, which makes him feel a lot better.  Drain to LLQ abscess remains in place, and he is still having intermittent lower abdominal sharp pains. No CP/dyspnea.   Objective Vitals:   08/08/21 0741 08/08/21 0800 08/08/21 0830 08/08/21 0900  BP: (!) 95/56 102/65 111/72 104/69  Pulse: 61 68 94 84  Resp:      Temp:      TempSrc:      SpO2:      Weight:      Height:       Physical Exam General: Frail appearing man, room air.  Heart: RRR; no murmur Lungs: CTA anteriorly Abdomen: soft, drain to LLQ with purulent output Extremities: No LE edema Dialysis Access:  RUE AVF + bruit  Additional Objective Labs: Basic Metabolic Panel: Recent Labs  Lab 08/04/21 0241 08/05/21 0345 08/07/21 0435 08/08/21 0812  NA 136 134* 137 136  K 3.1* 3.8 4.3 3.1*  CL 99 98 99 98  CO2 27 21* 25 26  GLUCOSE 79 75 75 70  BUN 22 34* 24* 29*  CREATININE 2.72* 3.73* 3.63* 4.80*  CALCIUM 8.7* 8.8* 8.8* 8.7*  PHOS 3.3 3.9  --  4.3   Liver Function Tests: Recent Labs  Lab 08/04/21 0241 08/05/21 0345 08/08/21 0812  AST 10*  --   --   ALT 9  --   --   ALKPHOS 180*  --   --   BILITOT 1.5*  --   --   PROT 5.9*  --   --   ALBUMIN 1.5*   1.5* 1.6* 1.6*   CBC: Recent Labs  Lab 08/03/21 0504 08/03/21 1838 08/04/21 0241 08/04/21 1649 08/05/21 0942 08/06/21 0219 08/07/21 0435 08/08/21 0123  WBC 14.8*   < > 9.6 12.8* 12.5* 9.4 8.9 6.9  NEUTROABS 12.5*  --  7.6  --   --   --   --   --   HGB 8.4*   < > 7.8* 9.2* 7.3* 7.9* 8.1* 7.8*  HCT 24.6*   < > 22.1* 27.3* 21.6* 23.1* 23.1* 22.2*  MCV 96.5   < > 95.7 99.3 98.6 99.1 98.7 98.2  PLT 253   < > 208 PLATELET CLUMPS NOTED ON SMEAR, UNABLE TO ESTIMATE 210 193 169 154   < > = values in this interval not displayed.   Blood Culture    Component Value Date/Time   SDES ABSCESS 08/06/2021 1110   SPECREQUEST NONE 08/06/2021 1110   CULT  08/06/2021  1110    FEW GRAM NEGATIVE RODS CULTURE REINCUBATED FOR BETTER GROWTH SUSCEPTIBILITIES TO FOLLOW Performed at [facility], [place] [address] [place], [place] [address]    REPTSTATUS PENDING 08/06/2021 1110   Studies/Results: DG Abd Portable 1V  Result Date: 08/07/2021 CLINICAL DATA:  Small bowel obstruction. EXAM: PORTABLE ABDOMEN - 1 VIEW COMPARISON:  08/04/2021 and CT abdomen 08/04/2021. FINDINGS: Nasogastric tube terminates in the stomach with the side port a few cm beyond the gastroesophageal junction. Placement of a percutaneous drainage catheter in the left anatomic pelvis. Gas is seen in nondilated small bowel and colon. Surgical clips in both upper quadrants. IMPRESSION: 1. Unremarkable bowel gas pattern. 2. Placement of a percutaneous drainage catheter in the left anatomic pelvis. Electronically Signed   By: Lorin Picket M.D.   On: 08/07/2021 08:14   CT IMAGE GUIDED DRAINAGE BY PERCUTANEOUS CATHETER  Result Date:  08/06/2021 INDICATION: 65 year old with bowel obstruction and intra-abdominal abscess. Plan for CT-guided drain placement. Prior abdominal surgery in December 2022. EXAM: CT-guided placement of lower abdominal/pelvic abscess drain MEDICATIONS: Moderate sedation ANESTHESIA/SEDATION: Moderate (conscious) sedation was employed during this procedure. A total of Versed 2.0mg  and fentanyl 100 mcg was administered intravenously at the order of the provider performing the procedure. Total intra-service moderate sedation time: 34 minutes. Patient's level of consciousness and vital signs were monitored continuously by radiology nurse throughout the procedure under the supervision of the provider performing the procedure. COMPLICATIONS: None immediate. PROCEDURE: Informed written consent was obtained from the patient after a thorough discussion of the procedural risks, benefits and alternatives. All questions were addressed.A timeout was performed prior to the initiation of the  procedure. Patient was placed supine on CT scanner. Images through the lower abdomen and pelvis were obtained. The anterior fluid collection was identified and targeted. The anterior abdomen was prepped with chlorhexidine and sterile field was created. Maximal barrier sterile technique was utilized including caps, mask, sterile gowns, sterile gloves, sterile drape, hand hygiene and skin antiseptic. Skin was anesthetized using 1% lidocaine. Small incision was made. Using CT guidance, an 18 gauge trocar needle was directed into the anterior fluid collection. Gray purulent fluid was aspirated. Superstiff Amplatz wire was advanced into the collection and the tract was dilated to accommodate a 10 [place] multipurpose drain. 30 mL of fluid was removed. Drain was flushed with saline and attached to a suction bulb. Drain was sutured to skin. Dressing was placed. FINDINGS: Thick wall fluid collection containing a small amount of gas in the anterior lower abdomen and upper pelvis. 10 French drain was placed and 30 mL of gray purulent-looking fluid was removed. IMPRESSION: CT-guided placement of a drain in the lower abdominal/pelvic abscess. Electronically Signed   By: Markus Daft M.D.   On: 08/06/2021 12:29    Medications:  sodium chloride     sodium chloride     sodium chloride     piperacillin-tazobactam (ZOSYN)  IV 2.25 g (08/08/21 0601)   promethazine (PHENERGAN) injection (IM or IVPB) Stopped (08/02/21 0135)    (feeding supplement) PROSource Plus  30 mL Oral BID BM   allopurinol  100 mg Oral Daily   Chlorhexidine Gluconate Cloth  6 each Topical Q0600   darbepoetin (ARANESP) injection - DIALYSIS  200 mcg Intravenous Q Sat-HD   ferric citrate  420 mg Oral TID with meals   folic acid  1 mg Oral Daily   hydroxyurea  500 mg Oral Daily   pantoprazole (PROTONIX) IV  40 mg Intravenous Q12H   polyethylene glycol  17 g Oral Daily   senna-docusate  2 tablet Oral QHS   sodium chloride flush  3 mL Intravenous Q12H     Dialysis Orders: TTS - AF 4hrs, 400/500,  EDW 55.5kg, 3K/ 2Ca UFP2  RU AVF no heparin - Mircera 225 mcg q2wks - last 07/20/21 - Hectorol 51mcg IV qHD   - Sensipar 30mg  qHD   Assessment/Plan:  SBO with Abdominal abscess: NG tube now out. S/p abscess drain placement 2/19 -> Cx pending, growing GNR for now -> started on IV ZOsyn. Symptomatic acute on chronic anemia of CKD - Hx recurrent GIB. Hgb 5.2 on admit s/p 2 unit pRBC improved to 7.6, then drop to 6.6 post HD.  Given another 1U PRBCs on 2/14. Hgb now stable - 7.8 today. Continues Aranesp 269mcg q Saturday.  ESRD: Continue HD per usual TTS schedule - HD today. Hypokalemia: Using  higher K bath with HD.  Hypotension/volume: BP runs soft.  Avoiding midodrine with prolonged QT. Secondary Hyperparathyroidism: Corrected calcium is high, holding VDRA.  Phos ok, continue binders and restart sensipar once tolerating PO. Severe protein calorie malnutrition: Slowly advancing diet, protein supplements ordered. A fib - not on eliquis d/t GIB Sickle cell anemia - chronic, having back pain this admission  Veneta Penton, PA-C 08/08/2021, 9:04 AM  [facility]

## 2021-08-08 NOTE — Progress Notes (Signed)
Referring Physician(s): Wilmer Floor PA  Supervising Physician: Daryll Brod  Patient Status:  [facility] - In-pt  Chief Complaint:  Pelvic abscess s/pa LLQ pelvic abscess drain placed on 2.19.23 by Dr. Anselm Pancoast  Subjective:  Patient states that he is feeling better overall.   Allergies: Patient has no known allergies.  Medications: Prior to Admission medications   Medication Sig Start Date End Date Taking? Authorizing Provider  Acetaminophen (TYLENOL 8 HOUR PO) Patient takes two 500 mg tablets as needed for pain.   Yes [provider]  albuterol (VENTOLIN HFA) 108 (90 Base) MCG/ACT inhaler Inhale 2 puffs into the lungs every 6 (six) hours as needed for wheezing or shortness of breath. 05/09/20  Yes Freddi Starr, MD  allopurinol (ZYLOPRIM) 100 MG tablet TAKE 1 TABLET BY MOUTH EVERY DAY Patient taking differently: Take 100 mg by mouth daily. 07/13/21  Yes Dorena Dew, FNP  AURYXIA 1 GM 210 MG(Fe) tablet Take 420 mg by mouth 3 (three) times daily. 02/11/21  Yes [provider]  folic acid (FOLVITE) 1 MG tablet TAKE 1 TABLET BY MOUTH EVERY DAY Patient taking differently: Take 1 mg by mouth daily. 02/22/21  Yes Dorena Dew, FNP  Glycerin-Hypromellose-PEG 400 (VISINE DRY EYE OP) Place 1 drop into both eyes as needed (dryness).   Yes [provider]  hydroxyurea (HYDREA) 500 MG capsule Take 1 capsule (500 mg total) by mouth daily. TAKE 1 CAPSULE BY MOUTH EVERY DAY WITH FOOD TO MINIMIZE GI SIDE EFFECTS Patient taking differently: Take 500 mg by mouth daily before breakfast. 01/31/21  Yes Dorena Dew, FNP  lidocaine-prilocaine (EMLA) cream Apply 1 application topically daily as needed (port access).  09/08/19  Yes [provider]  oxycodone (OXY-IR) 5 MG capsule Take 2 capsules (10 mg total) by mouth every 6 (six) hours as needed for pain. 07/31/21  Yes Dorena Dew, FNP  pantoprazole (PROTONIX) 40 MG tablet TAKE 1 TABLET BY MOUTH TWICE A  DAY Patient taking differently: Take 40 mg by mouth 2 (two) times daily. 07/26/21  Yes Willia Craze, NP  tiZANidine (ZANAFLEX) 4 MG tablet Take 0.5 tablets (2 mg total) by mouth every 8 (eight) hours as needed for muscle spasms. 07/18/21  Yes Dorena Dew, FNP  torsemide (DEMADEX) 20 MG tablet Take 20 mg by mouth in the morning, at noon, in the evening, and at bedtime.   Yes [provider]  Vitamin D, Ergocalciferol, (DRISDOL) 1.25 MG (50000 UNIT) CAPS capsule TAKE 1 CAPSULE BY MOUTH ONE TIME PER WEEK Patient taking differently: Take 50,000 Units by mouth once a week. Monday 05/15/21  Yes Dorena Dew, FNP  carvedilol (COREG) 3.125 MG tablet Take 1 tablet (3.125 mg total) by mouth 2 (two) times daily. Patient not taking: Reported on 07/31/2021 07/17/21 10/15/21  Rafael Bihari, FNP  gabapentin (NEURONTIN) 100 MG capsule TAKE 1 CAPSULE BY MOUTH EVERYDAY AT BEDTIME Patient not taking: Reported on 07/31/2021 12/12/20   Dorena Dew, FNP     Vital Signs: BP 109/71 (BP Location: Left Arm)    Pulse 90    Temp 98 F (36.7 C) (Oral)    Resp 16    Ht 5\' 11"  (1.803 m)    Wt 113 lb 1.5 oz (51.3 kg)    SpO2 95%    BMI 15.77 kg/m   Physical Exam Vitals and nursing note reviewed.  Constitutional:      Appearance: He is well-developed.  HENT:  Head: Normocephalic.  Pulmonary:     Effort: Pulmonary effort is normal.  Abdominal:     Comments: Positive LLQ  drain to suction. Site is unremarkable with no erythema, edema, tenderness, bleeding or drainage noted at exit site. Suture and stat lock in place. Dressing is clean dry and intact. 5 ml of  purulent  serosanguinous colored fluid noted in bulb suction device. Drain is able to be flushed easily.    Musculoskeletal:        General: Normal range of motion.     Cervical back: Normal range of motion.  Skin:    General: Skin is dry.  Neurological:     Mental Status: He is alert and oriented to person, place, and time.     Imaging: DG Abd 1 View  Result Date: 08/04/2021 CLINICAL DATA:  NG tube placement. EXAM: ABDOMEN - 1 VIEW COMPARISON:  Abdominopelvic CT earlier today. FINDINGS: Tip of the enteric tube is below the diaphragm, the side port is in the region of the gastroesophageal junction. Recommend advancement of approximately 7 cm to place the side-port below the diaphragm. Mild gaseous gastric distension. IMPRESSION: Tip of the enteric tube below the diaphragm in the stomach, side-port in the region of the gastroesophageal junction. Recommend advancement of approximately 7 cm to place the side-port below the diaphragm. Electronically Signed   By: Keith Rake M.D.   On: 08/04/2021 21:05   CT ABDOMEN PELVIS W CONTRAST  Result Date: 08/04/2021 CLINICAL DATA:  Nausea and vomiting. EXAM: CT ABDOMEN AND PELVIS WITH CONTRAST TECHNIQUE: Multidetector CT imaging of the abdomen and pelvis was performed using the standard protocol following bolus administration of intravenous contrast. RADIATION DOSE REDUCTION: This exam was performed according to the departmental dose-optimization program which includes automated exposure control, adjustment of the mA and/or kV according to patient size and/or use of iterative reconstruction technique. CONTRAST:  114mL OMNIPAQUE IOHEXOL 350 MG/ML SOLN COMPARISON:  Abdominopelvic CT 06/27/2021 FINDINGS: Lower chest: Multi chamber cardiomegaly. Trace left pleural effusion and basilar atelectasis. Patient is leaning to the left. Hepatobiliary: No focal liver lesion. Mild hypertrophy of the left hepatic lobe. No convincing capsular nodularity. Pneumobilia with air in the common bile duct and central intrahepatic biliary tree. Distal common bile duct measures 10 mm. Cholecystectomy. Pancreas: No peripancreatic fat stranding. Prominent proximal pancreatic duct at 4 mm. No obvious pancreatic mass. Spleen: Atrophic and calcified. Adrenals/Urinary Tract: No adrenal nodule. No hydronephrosis.  There are multiple bilateral renal cysts. Mild bilateral renal parenchymal atrophy. There is absent renal excretion on delayed phase imaging. Thick-walled urinary bladder. Stomach/Bowel: Detailed bowel assessment is limited in the absence of enteric contrast, paucity of abdominal fat, and presence of abdominopelvic ascites. The stomach is dilated and fluid-filled. Clips are seen within the anterior gastric lumen. Dependent hyperdensity in the stomach likely ingested pills. The duodenum and proximal small bowel are dilated and fluid-filled there are intermittently areas of decompressed small bowel with wall thickening in the region of small bowel dilatation. Area transition in the left mid abdomen related small bowel thickening. The more distal small bowel is decompressed. There are right lower quadrant chain sutures. Lateral to these chain sutures is a thick-walled fluid collection measuring 9.1 x 5.3 x 4.4 cm, series 3, image 58 and series 6, image 40. This contains non dependent air, with linear air tracking about the right lateral aspect into the right pelvis. This appears new from prior exam. The appendix is present in contains enteric contrast. There is wall thickening of  the ascending colon. Remainder of the colon is decompressed and not well assessed on the current exam. Questionable wall thickening involving the proximal sigmoid colon. There is stool distending the rectum. Vascular/Lymphatic: Moderate aortic atherosclerosis. Moderate branch atherosclerosis. Patent portal and splenic veins. No obvious abdominopelvic adenopathy, assessment limited by ascites and paucity of abdominal fat. Reproductive: Unremarkable prostate. Other: Abdominopelvic ascites, small to moderate in degree, diminished from prior CT. There is a thick-walled fluid collection in the anterior pelvis as described above. Generalized edema of the mesenteric and subcutaneous fat. No pneumoperitoneum. Musculoskeletal: Heterogeneous diffuse  osseous sclerosis may represent sequela of renal osteodystrophy or sickle cell. L5-S1 degenerative disc disease. No acute osseous findings are seen. IMPRESSION: 1. Findings suspicious for small-bowel obstruction with transition point in the left mid abdomen, possibly due to adhesions. 2. Thick-walled fluid collection in the anterior pelvis measuring 9.1 x 5.3 x 4.4 cm, suspicious for abscess. This is new from prior exam. Underlying etiology is indeterminate, this is adjacent to enteric sutures from prior bowel resection. 3. Pneumobilia with air in the common bile duct and central intrahepatic biliary tree, likely related to prior sphincterotomy. Mild dilatation of the distal common bile duct and proximal pancreatic duct without evidence of pancreatic mass. 4. Thick-walled urinary bladder, may be related to chronic renal disease or seen with cystitis. 5. Questionable wall thickening involving the proximal sigmoid colon. 6. Abdominopelvic ascites, small to moderate in degree, diminished from prior CT. 7. Multi chamber cardiomegaly. Trace left pleural effusion and basilar atelectasis. Aortic Atherosclerosis (ICD10-I70.0). Electronically Signed   By: Keith Rake M.D.   On: 08/04/2021 18:58   DG Abd Portable 1V  Result Date: 08/07/2021 CLINICAL DATA:  Small bowel obstruction. EXAM: PORTABLE ABDOMEN - 1 VIEW COMPARISON:  08/04/2021 and CT abdomen 08/04/2021. FINDINGS: Nasogastric tube terminates in the stomach with the side port a few cm beyond the gastroesophageal junction. Placement of a percutaneous drainage catheter in the left anatomic pelvis. Gas is seen in nondilated small bowel and colon. Surgical clips in both upper quadrants. IMPRESSION: 1. Unremarkable bowel gas pattern. 2. Placement of a percutaneous drainage catheter in the left anatomic pelvis. Electronically Signed   By: Lorin Picket M.D.   On: 08/07/2021 08:14   CT IMAGE GUIDED DRAINAGE BY PERCUTANEOUS CATHETER  Result Date:  08/06/2021 INDICATION: 66 year old with bowel obstruction and intra-abdominal abscess. Plan for CT-guided drain placement. Prior abdominal surgery in December 2022. EXAM: CT-guided placement of lower abdominal/pelvic abscess drain MEDICATIONS: Moderate sedation ANESTHESIA/SEDATION: Moderate (conscious) sedation was employed during this procedure. A total of Versed 2.0mg  and fentanyl 100 mcg was administered intravenously at the order of the provider performing the procedure. Total intra-service moderate sedation time: 34 minutes. Patient's level of consciousness and vital signs were monitored continuously by radiology nurse throughout the procedure under the supervision of the provider performing the procedure. COMPLICATIONS: None immediate. PROCEDURE: Informed written consent was obtained from the patient after a thorough discussion of the procedural risks, benefits and alternatives. All questions were addressed.A timeout was performed prior to the initiation of the procedure. Patient was placed supine on CT scanner. Images through the lower abdomen and pelvis were obtained. The anterior fluid collection was identified and targeted. The anterior abdomen was prepped with chlorhexidine and sterile field was created. Maximal barrier sterile technique was utilized including caps, mask, sterile gowns, sterile gloves, sterile drape, hand hygiene and skin antiseptic. Skin was anesthetized using 1% lidocaine. Small incision was made. Using CT guidance, an 18 gauge trocar needle was directed  into the anterior fluid collection. Gray purulent fluid was aspirated. Superstiff Amplatz wire was advanced into the collection and the tract was dilated to accommodate a 10 [place] multipurpose drain. 30 mL of fluid was removed. Drain was flushed with saline and attached to a suction bulb. Drain was sutured to skin. Dressing was placed. FINDINGS: Thick wall fluid collection containing a small amount of gas in the anterior lower abdomen  and upper pelvis. 10 French drain was placed and 30 mL of gray purulent-looking fluid was removed. IMPRESSION: CT-guided placement of a drain in the lower abdominal/pelvic abscess. Electronically Signed   By: Markus Daft M.D.   On: 08/06/2021 12:29    Labs:  CBC: Recent Labs    08/05/21 0942 08/06/21 0219 08/07/21 0435 08/08/21 0123  WBC 12.5* 9.4 8.9 6.9  HGB 7.3* 7.9* 8.1* 7.8*  HCT 21.6* 23.1* 23.1* 22.2*  PLT 210 193 169 154    COAGS: Recent Labs    05/29/21 0113 05/29/21 1327 06/27/21 0830 08/06/21 0219  INR 1.3* 1.3* 1.9* 1.4*  APTT  --  33  --   --     BMP: Recent Labs    08/04/21 0241 08/05/21 0345 08/07/21 0435 08/08/21 0812  NA 136 134* 137 136  K 3.1* 3.8 4.3 3.1*  CL 99 98 99 98  CO2 27 21* 25 26  GLUCOSE 79 75 75 70  BUN 22 34* 24* 29*  CALCIUM 8.7* 8.8* 8.8* 8.7*  CREATININE 2.72* 3.73* 3.63* 4.80*  GFRNONAA 25* 17* 18* 13*    LIVER FUNCTION TESTS: Recent Labs    06/05/21 0350 06/06/21 0719 06/27/21 0830 07/31/21 1611 08/01/21 0758 08/04/21 0241 08/05/21 0345 08/08/21 0812  BILITOT 1.5*  --  3.0* 1.4*  --  1.5*  --   --   AST 20  --  23 14*  --  10*  --   --   ALT 16  --  14 10  --  9  --   --   ALKPHOS 263*  --  563* 237*  --  180*  --   --   PROT 5.4*  --  6.1* 6.5  --  5.9*  --   --   ALBUMIN 1.7*   < > <1.5* <1.5* <1.5* 1.5*   1.5* 1.6* 1.6*   < > = values in this interval not displayed.    Assessment and Plan:  66 y.o. male inpatient. History of CHF, ESRD on HD, sickle cell. Found to have a cute gangrenous cholecystitis s/p ex lap, small bowel resection., cholecystectomy on 12.7.22. Presented to the ED on 2.13.23 for anemia. Hospital stay complicated bu post op pelvic fluid abscess. IR placed a pelvic abscess drain on 2.19.23  Drain Location: LLQ Size: Fr size: 10 Fr Date of placement: 2.19.23  Currently to: Drain collection device: suction bulb 24 hour output:  Output by Drain (mL) 08/06/21 0701 - 08/06/21 1900 08/06/21  1901 - 08/07/21 0700 08/07/21 0701 - 08/07/21 1900 08/07/21 1901 - 08/08/21 0700 08/08/21 0701 - 08/08/21 1319  Closed System Drain 1 Groin Bulb (JP) 10 Fr. 30 25 10      Positive LLQ  drain to suction. Site is unremarkable with no erythema, edema, tenderness, bleeding or drainage noted at exit site. Suture and stat lock in place. Dressing is clean dry and intact. 5 ml of  purulent  serosanguinous colored fluid noted in bulb suction device. Drain is able to be flushed easily. Cultures from abscess drew e coli.  Interval imaging/drain manipulation:  KUB on 2.20.23 reads Placement of a percutaneous drainage catheter in the left anatomic pelvis.  Current examination: Flushes/aspirates easily.  Insertion site unremarkable. Suture and stat lock in place. Dressed appropriately.   Plan: Continue TID flushes with 5 cc NS. Record output Q shift. Dressing changes QD or PRN if soiled.  Call IR APP or on call IR MD if difficulty flushing or sudden change in drain output.  Repeat imaging/possible drain injection once output < 10 mL/QD (excluding flush material.)  Discharge planning: Please contact IR APP or on call IR MD prior to patient d/c to ensure appropriate follow up plans are in place. Typically patient will follow up with IR clinic 10-14 days post d/c for repeat imaging/possible drain injection. IR scheduler will contact patient with date/time of appointment. Patient will need to flush drain QD with 5 cc NS, record output QD, dressing changes every 2-3 days or earlier if soiled.   IR will continue to follow - please call with questions or concerns.  Electronically Signed: Jacqualine Mau, NP 08/08/2021, 1:14 PM   I spent a total of 15 Minutes at the patient's bedside AND on the patient's hospital floor or unit, greater than 50% of which was counseling/coordinating care for left pelvic abscess drain.

## 2021-08-08 NOTE — Progress Notes (Signed)
PROGRESS NOTE        PATIENT DETAILS Name: Patrick [name] Age: 66 y.o. Sex: male Date of Birth: 1956-05-22 Admit Date: 07/31/2021 Admitting Physician Eben Burow, MD YIF:[id number], Asencion Partridge, FNP  Brief Summary: 66 year old male with history of ESRD on HD, sickle cell disease, permanent atrial fibrillation, history of UGI due to AVMs, recent history (December 2022) of choledocholithiasis and ischemic small bowel requiring cholecystectomy/biliary stent placement/bowel resection-referred to the ED from HD center for hemoglobin of 5.8-subsequent EGD on 2/15 showed bleeding AVMs.  Hospital course complicated by persistent nausea and vomiting-upon further evaluation-found to have SBO and pelvic abscess.  Significant events: 2/13>> referred to the ED from HD center for evaluation of hemoglobin of 5.8.   2/17>> ?SBO/Abscess on CT abdomen/pelvis -General surgery consulted 2/19>> percutaneous pelvic drain placed; worsening chronic back pain 2/20>> back pain moderately improving on current regimen, minimal NG output, scant pelvic drain output overnight  Significant imaging studies: 2/17>> MRI LS spine: Limited exams-no acute abnormalities. 2/17>> CT abd/pelvis: Questionable SBO and abcess  Significant microbiology data: 2/13>> COVID/influenza PCR: Negative  Procedures: 2/15>> EGD: Multiple bleeding angiectasia's in the stomach treated with argon plasma coagulation 2/19>> Pelvic fluid drain placed, 10 [place]  Consults: GI, nephrology, Gen Sx  Subjective: Seen at hemodialysis earlier this morning-awake/alert.  Looks a whole lot better than last week when I last saw him.  NG tube remains in place-claims having BM.  Inquiring about discharge.  Objective: Vitals: Blood pressure 104/69, pulse 84, temperature 97.9 F (36.6 C), temperature source Oral, resp. rate 18, height 5\' 11"  (1.803 m), weight 52.4 kg, SpO2 99 %.   Exam: Gen Exam:Alert awake-not in any  distress HEENT:atraumatic, normocephalic Chest: B/L clear to auscultation anteriorly CVS:S1S2 regular Abdomen:soft non tender, non distended Extremities:no edema Neurology: Non focal Skin: no rash     Pertinent Labs/Radiology: CBC Latest Ref Rng & Units 08/08/2021 08/07/2021 08/06/2021  WBC 4.0 - 10.5 K/uL 6.9 8.9 9.4  Hemoglobin 13.0 - 17.0 g/dL 7.8(L) 8.1(L) 7.9(L)  Hematocrit 39.0 - 52.0 % 22.2(L) 23.1(L) 23.1(L)  Platelets 150 - 400 K/uL 154 169 193    Lab Results  Component Value Date   NA 136 08/08/2021   K 3.1 (L) 08/08/2021   CL 98 08/08/2021   CO2 26 08/08/2021      Assessment/Plan: SBO with pelvic abscess: Managed conservatively-with clinical improvement-tolerating sips/chips-having BMs.  S/p drain placed by IR on 2/19-cultures pending-remains on empiric Zosyn.    Upper GI bleeding with acute blood loss anemia:  EGD on 2/15 showed bleeding AVMs-hemoglobin has stabilized after 3 units of PRBC transfusion so far.  No further signs of GI bleeding.  Recent history of choledocholithiasis-s/p ERCP with pancreatic/biliary stents (removed) followed by open cholecystectomy and resection of ischemic small bowel on 05/24/2021  ESRD: On HD per nephrology.  Chronic HFrEF (EF 40-45% by echo December 2022): Volume status stable-volume removal with HD.  Permanent atrial fibrillation: Rate controlled with Coreg-not a candidate for anticoagulation due to recurrent GI bleeding.  HTN: BP stable-not on any antihypertensives.  Sickle cell disease/Lumbago: Stable-MRI of LS spine without any acute abnormalities although a poor study.  Continue supportive care.  Benign neurological exam.  BMI: Estimated body mass index is 16.11 kg/m as calculated from the following:   Height as of this encounter: 5\' 11"  (1.803 m).   Weight as of  this encounter: 52.4 kg.   Code status:   Code Status: Full Code   DVT Prophylaxis: SCDs Start: 07/31/21 2024   Family Communication: None at  bedside  Disposition Plan: Status is: Inpatient Remains inpatient appropriate because: Upper GI bleeding-s/p EGD with APC of bleeding AVMs-now with persistent nausea/vomiting with questionable SBO and pelvic fluid collection.  Work-up in progress.  Planned Discharge Destination:Home   Diet: Diet Order             Diet NPO time specified Except for: Ice Chips, Other (See Comments)  Diet effective now                   Antimicrobial agents: Anti-infectives (From admission, onward)    Start     Dose/Rate Route Frequency Ordered Stop   08/05/21 2200  piperacillin-tazobactam (ZOSYN) IVPB 2.25 g        2.25 g 100 mL/hr over 30 Minutes Intravenous Every 8 hours 08/05/21 1455     08/04/21 2200  piperacillin-tazobactam (ZOSYN) IVPB 3.375 g  Status:  Discontinued        3.375 g 12.5 mL/hr over 240 Minutes Intravenous Every 8 hours 08/04/21 1927 08/05/21 1455        MEDICATIONS: Scheduled Meds:  (feeding supplement) PROSource Plus  30 mL Oral BID BM   allopurinol  100 mg Oral Daily   Chlorhexidine Gluconate Cloth  6 each Topical Q0600   darbepoetin (ARANESP) injection - DIALYSIS  200 mcg Intravenous Q Sat-HD   ferric citrate  420 mg Oral TID with meals   folic acid  1 mg Oral Daily   hydroxyurea  500 mg Oral Daily   pantoprazole (PROTONIX) IV  40 mg Intravenous Q12H   polyethylene glycol  17 g Oral Daily   senna-docusate  2 tablet Oral QHS   sodium chloride flush  3 mL Intravenous Q12H   Continuous Infusions:  sodium chloride     sodium chloride     sodium chloride     piperacillin-tazobactam (ZOSYN)  IV 2.25 g (08/08/21 0601)   promethazine (PHENERGAN) injection (IM or IVPB) Stopped (08/02/21 0135)   PRN Meds:.sodium chloride, sodium chloride, acetaminophen **OR** acetaminophen, heparin, HYDROmorphone (DILAUDID) injection, lidocaine (PF), lidocaine-prilocaine, ondansetron **OR** ondansetron (ZOFRAN) IV, oxyCODONE, pentafluoroprop-tetrafluoroeth, promethazine (PHENERGAN)  injection (IM or IVPB), sodium chloride flush   I have personally reviewed following labs and imaging studies  LABORATORY DATA: CBC: Recent Labs  Lab 08/03/21 0504 08/03/21 1838 08/04/21 0241 08/04/21 1649 08/05/21 0942 08/06/21 0219 08/07/21 0435 08/08/21 0123  WBC 14.8*   < > 9.6 12.8* 12.5* 9.4 8.9 6.9  NEUTROABS 12.5*  --  7.6  --   --   --   --   --   HGB 8.4*   < > 7.8* 9.2* 7.3* 7.9* 8.1* 7.8*  HCT 24.6*   < > 22.1* 27.3* 21.6* 23.1* 23.1* 22.2*  MCV 96.5   < > 95.7 99.3 98.6 99.1 98.7 98.2  PLT 253   < > 208 PLATELET CLUMPS NOTED ON SMEAR, UNABLE TO ESTIMATE 210 193 169 154   < > = values in this interval not displayed.     Basic Metabolic Panel: Recent Labs  Lab 08/02/21 0143 08/04/21 0241 08/05/21 0345 08/07/21 0435 08/08/21 0812  NA 136 136 134* 137 136  K 4.1 3.1* 3.8 4.3 3.1*  CL 98 99 98 99 98  CO2 23 27 21* 25 26  GLUCOSE 65* 79 75 75 70  BUN 28* 22 34* 24* 29*  CREATININE 2.93* 2.72* 3.73* 3.63* 4.80*  CALCIUM 8.5* 8.7* 8.8* 8.8* 8.7*  PHOS  --  3.3 3.9  --  4.3     GFR: Estimated Creatinine Clearance: 11.7 mL/min (A) (by C-G formula based on SCr of 4.8 mg/dL (H)).  Liver Function Tests: Recent Labs  Lab 08/04/21 0241 08/05/21 0345 08/08/21 0812  AST 10*  --   --   ALT 9  --   --   ALKPHOS 180*  --   --   BILITOT 1.5*  --   --   PROT 5.9*  --   --   ALBUMIN 1.5*   1.5* 1.6* 1.6*    No results for input(s): LIPASE, AMYLASE in the last 168 hours. No results for input(s): AMMONIA in the last 168 hours.  Coagulation Profile: Recent Labs  Lab 08/06/21 0219  INR 1.4*     Cardiac Enzymes: No results for input(s): CKTOTAL, CKMB, CKMBINDEX, TROPONINI in the last 168 hours.  BNP (last 3 results) No results for input(s): PROBNP in the last 8760 hours.  Lipid Profile: No results for input(s): CHOL, HDL, LDLCALC, TRIG, CHOLHDL, LDLDIRECT in the last 72 hours.  Thyroid Function Tests: No results for input(s): TSH, T4TOTAL, FREET4,  T3FREE, THYROIDAB in the last 72 hours.  Anemia Panel: No results for input(s): VITAMINB12, FOLATE, FERRITIN, TIBC, IRON, RETICCTPCT in the last 72 hours.  Urine analysis:    Component Value Date/Time   COLORURINE STRAW (A) 06/02/2018 1900   APPEARANCEUR CLEAR 06/02/2018 1900   APPEARANCEUR Clear 05/31/2017 0902   LABSPEC 1.006 06/02/2018 1900   PHURINE 7.0 06/02/2018 1900   GLUCOSEU NEGATIVE 06/02/2018 1900   HGBUR SMALL (A) 06/02/2018 1900   BILIRUBINUR neg 01/26/2020 1124   BILIRUBINUR Negative 05/31/2017 0902   KETONESUR negative 09/08/2018 1621   KETONESUR NEGATIVE 06/02/2018 1900   PROTEINUR Positive (A) 01/26/2020 1124   PROTEINUR 30 (A) 06/02/2018 1900   UROBILINOGEN 1.0 01/26/2020 1124   UROBILINOGEN 0.2 09/14/2013 1354   NITRITE neg 01/26/2020 1124   NITRITE NEGATIVE 06/02/2018 1900   LEUKOCYTESUR Negative 01/26/2020 1124   LEUKOCYTESUR Negative 05/31/2017 0902    Sepsis Labs: Lactic Acid, Venous    Component Value Date/Time   LATICACIDVEN 1.3 06/27/2021 0830    MICROBIOLOGY: Recent Results (from the past 240 hour(s))  Resp Panel by RT-PCR (Flu A&B, Covid) Nasopharyngeal Swab     Status: None   Collection Time: 07/31/21 12:14 AM   Specimen: Nasopharyngeal Swab; Nasopharyngeal(NP) swabs in vial transport medium  Result Value Ref Range Status   SARS Coronavirus 2 by RT PCR NEGATIVE NEGATIVE Final    Comment: (NOTE) SARS-CoV-2 target nucleic acids are NOT DETECTED.  The SARS-CoV-2 RNA is generally detectable in upper respiratory specimens during the acute phase of infection. The lowest concentration of SARS-CoV-2 viral copies this assay can detect is 138 copies/mL. A negative result does not preclude SARS-Cov-2 infection and should not be used as the sole basis for treatment or other patient management decisions. A negative result may occur with  improper specimen collection/handling, submission of specimen other than nasopharyngeal swab, presence of viral  mutation(s) within the areas targeted by this assay, and inadequate number of viral copies(<138 copies/mL). A negative result must be combined with clinical observations, patient history, and epidemiological information. The expected result is Negative.  Fact Sheet for Patients:  EntrepreneurPulse.com.au  Fact Sheet for Healthcare Providers:  IncredibleEmployment.be  This test is no t yet approved or cleared by the [place] and  has been authorized for detection and/or diagnosis of SARS-CoV-2 by FDA under an Emergency Use Authorization (EUA). This EUA will remain  in effect (meaning this test can be used) for the duration of the COVID-19 declaration under Section 564(b)(1) of the Act, 21 U.S.C.section 360bbb-3(b)(1), unless the authorization is terminated  or revoked sooner.       Influenza A by PCR NEGATIVE NEGATIVE Final   Influenza B by PCR NEGATIVE NEGATIVE Final    Comment: (NOTE) The Xpert Xpress SARS-CoV-2/FLU/RSV plus assay is intended as an aid in the diagnosis of influenza from Nasopharyngeal swab specimens and should not be used as a sole basis for treatment. Nasal washings and aspirates are unacceptable for Xpert Xpress SARS-CoV-2/FLU/RSV testing.  Fact Sheet for Patients: EntrepreneurPulse.com.au  Fact Sheet for Healthcare Providers: IncredibleEmployment.be  This test is not yet approved or cleared by the [place] FDA and has been authorized for detection and/or diagnosis of SARS-CoV-2 by FDA under an Emergency Use Authorization (EUA). This EUA will remain in effect (meaning this test can be used) for the duration of the COVID-19 declaration under Section 564(b)(1) of the Act, 21 U.S.C. section 360bbb-3(b)(1), unless the authorization is terminated or revoked.  Performed at [facility], [place] [address], [place], [place] [address]   MRSA Next Gen by PCR, Nasal      Status: None   Collection Time: 08/01/21  5:54 AM   Specimen: Nasal Mucosa; Nasal Swab  Result Value Ref Range Status   MRSA by PCR Next Gen NOT DETECTED NOT DETECTED Final    Comment: (NOTE) The GeneXpert MRSA Assay (FDA approved for NASAL specimens only), is one component of a comprehensive MRSA colonization surveillance program. It is not intended to diagnose MRSA infection nor to guide or monitor treatment for MRSA infections. Test performance is not FDA approved in patients less than 24 years old. Performed at [facility], [place] [address] [place], [place] [address]   Aerobic/Anaerobic Culture w Gram Stain (surgical/deep wound)     Status: None (Preliminary result)   Collection Time: 08/06/21 11:10 AM   Specimen: Abscess  Result Value Ref Range Status   Specimen Description ABSCESS  Final   Special Requests NONE  Final   Gram Stain   Final    ABUNDANT WBC PRESENT, PREDOMINANTLY PMN ABUNDANT GRAM NEGATIVE RODS RARE GRAM POSITIVE COCCI    Culture   Final    FEW GRAM NEGATIVE RODS CULTURE REINCUBATED FOR BETTER GROWTH SUSCEPTIBILITIES TO FOLLOW Performed at [facility], [place] [address] [place], [place] [address]    Report Status PENDING  Incomplete    RADIOLOGY STUDIES/RESULTS: DG Abd Portable 1V  Result Date: 08/07/2021 CLINICAL DATA:  Small bowel obstruction. EXAM: PORTABLE ABDOMEN - 1 VIEW COMPARISON:  08/04/2021 and CT abdomen 08/04/2021. FINDINGS: Nasogastric tube terminates in the stomach with the side port a few cm beyond the gastroesophageal junction. Placement of a percutaneous drainage catheter in the left anatomic pelvis. Gas is seen in nondilated small bowel and colon. Surgical clips in both upper quadrants. IMPRESSION: 1. Unremarkable bowel gas pattern. 2. Placement of a percutaneous drainage catheter in the left anatomic pelvis. Electronically Signed   By: Lorin Picket M.D.   On: 08/07/2021 08:14   CT IMAGE GUIDED DRAINAGE BY PERCUTANEOUS  CATHETER  Result Date: 08/06/2021 INDICATION: 66 year old with bowel obstruction and intra-abdominal abscess. Plan for CT-guided drain placement. Prior abdominal surgery in December 2022. EXAM: CT-guided placement of lower abdominal/pelvic abscess drain MEDICATIONS: Moderate  sedation ANESTHESIA/SEDATION: Moderate (conscious) sedation was employed during this procedure. A total of Versed 2.0mg  and fentanyl 100 mcg was administered intravenously at the order of the provider performing the procedure. Total intra-service moderate sedation time: 34 minutes. Patient's level of consciousness and vital signs were monitored continuously by radiology nurse throughout the procedure under the supervision of the provider performing the procedure. COMPLICATIONS: None immediate. PROCEDURE: Informed written consent was obtained from the patient after a thorough discussion of the procedural risks, benefits and alternatives. All questions were addressed.A timeout was performed prior to the initiation of the procedure. Patient was placed supine on CT scanner. Images through the lower abdomen and pelvis were obtained. The anterior fluid collection was identified and targeted. The anterior abdomen was prepped with chlorhexidine and sterile field was created. Maximal barrier sterile technique was utilized including caps, mask, sterile gowns, sterile gloves, sterile drape, hand hygiene and skin antiseptic. Skin was anesthetized using 1% lidocaine. Small incision was made. Using CT guidance, an 18 gauge trocar needle was directed into the anterior fluid collection. Gray purulent fluid was aspirated. Superstiff Amplatz wire was advanced into the collection and the tract was dilated to accommodate a 10 [place] multipurpose drain. 30 mL of fluid was removed. Drain was flushed with saline and attached to a suction bulb. Drain was sutured to skin. Dressing was placed. FINDINGS: Thick wall fluid collection containing a small amount of gas in  the anterior lower abdomen and upper pelvis. 10 French drain was placed and 30 mL of gray purulent-looking fluid was removed. IMPRESSION: CT-guided placement of a drain in the lower abdominal/pelvic abscess. Electronically Signed   By: Markus Daft M.D.   On: 08/06/2021 12:29     LOS: 8 days   Dorthia Tout, DO  [facility]  To contact the attending provider between 7A-7P or the covering provider during after hours 7P-7A, please log into the web site [email or web address] and access using universal [place] password for that web site. If you do not have the password, please call the hospital operator.  08/08/2021, 9:26 AM

## 2021-08-08 NOTE — Plan of Care (Signed)

## 2021-08-08 NOTE — Progress Notes (Signed)
6 Days Post-Op  Subjective: Continues to have flatus and some BM.  No nausea with NGT clamped all day yesterday.  100cc of bilious residual this morning.  ROS: See above, otherwise other systems negative  Objective: Vital signs in last 24 hours: Temp:  [97.5 F (36.4 C)-98.2 F (36.8 C)] 97.9 F (36.6 C) (02/21 0732) Pulse Rate:  [61-102] 83 (02/21 1000) Resp:  [14-19] 18 (02/21 0732) BP: (95-111)/(56-77) 104/72 (02/21 1000) SpO2:  [98 %-100 %] 99 % (02/21 0732) Weight:  [52.4 kg-56.5 kg] 52.4 kg (02/21 0732) Last BM Date : 08/07/21  Intake/Output from previous day: 02/20 0701 - 02/21 0700 In: 180 [P.O.:180] Out: 10 [Drains:10] Intake/Output this shift: No intake/output data recorded.  PE: Gen: NAD, laying in bed Abd: soft, NT, ND, midline wound almost completely healed.  Small area of scabbing still present.  NGT in place with minimal residual.  DC at bedside in HD.  IR drain in place with some seropurulent drainage noted.  10cc/24hrs   Lab Results:  Recent Labs    08/07/21 0435 08/08/21 0123  WBC 8.9 6.9  HGB 8.1* 7.8*  HCT 23.1* 22.2*  PLT 169 154   BMET Recent Labs    08/07/21 0435 08/08/21 0812  NA 137 136  K 4.3 3.1*  CL 99 98  CO2 25 26  GLUCOSE 75 70  BUN 24* 29*  CREATININE 3.63* 4.80*  CALCIUM 8.8* 8.7*   PT/INR Recent Labs    08/06/21 0219  LABPROT 17.5*  INR 1.4*   CMP     Component Value Date/Time   NA 136 08/08/2021 0812   NA 135 05/24/2020 1119   K 3.1 (L) 08/08/2021 0812   CL 98 08/08/2021 0812   CO2 26 08/08/2021 0812   GLUCOSE 70 08/08/2021 0812   BUN 29 (H) 08/08/2021 0812   BUN 25 05/24/2020 1119   CREATININE 4.80 (H) 08/08/2021 0812   CREATININE 2.90 (H) 08/11/2015 1359   CALCIUM 8.7 (L) 08/08/2021 0812   PROT 5.9 (L) 08/04/2021 0241   PROT 7.5 05/24/2020 1119   ALBUMIN 1.6 (L) 08/08/2021 0812   ALBUMIN 4.1 05/24/2020 1119   AST 10 (L) 08/04/2021 0241   ALT 9 08/04/2021 0241   ALKPHOS 180 (H) 08/04/2021 0241    BILITOT 1.5 (H) 08/04/2021 0241   BILITOT 2.4 (H) 05/24/2020 1119   GFRNONAA 13 (L) 08/08/2021 0812   GFRNONAA 23 (L) 08/11/2015 1359   GFRAA 24 (L) 05/24/2020 1119   GFRAA 27 (L) 08/11/2015 1359   Lipase     Component Value Date/Time   LIPASE 30 06/27/2021 0830       Studies/Results: DG Abd Portable 1V  Result Date: 08/07/2021 CLINICAL DATA:  Small bowel obstruction. EXAM: PORTABLE ABDOMEN - 1 VIEW COMPARISON:  08/04/2021 and CT abdomen 08/04/2021. FINDINGS: Nasogastric tube terminates in the stomach with the side port a few cm beyond the gastroesophageal junction. Placement of a percutaneous drainage catheter in the left anatomic pelvis. Gas is seen in nondilated small bowel and colon. Surgical clips in both upper quadrants. IMPRESSION: 1. Unremarkable bowel gas pattern. 2. Placement of a percutaneous drainage catheter in the left anatomic pelvis. Electronically Signed   By: Lorin Picket M.D.   On: 08/07/2021 08:14   CT IMAGE GUIDED DRAINAGE BY PERCUTANEOUS CATHETER  Result Date: 08/06/2021 INDICATION: 66 year old with bowel obstruction and intra-abdominal abscess. Plan for CT-guided drain placement. Prior abdominal surgery in December 2022. EXAM: CT-guided placement of lower abdominal/pelvic abscess drain MEDICATIONS:  Moderate sedation ANESTHESIA/SEDATION: Moderate (conscious) sedation was employed during this procedure. A total of Versed 2.0mg  and fentanyl 100 mcg was administered intravenously at the order of the provider performing the procedure. Total intra-service moderate sedation time: 34 minutes. Patient's level of consciousness and vital signs were monitored continuously by radiology nurse throughout the procedure under the supervision of the provider performing the procedure. COMPLICATIONS: None immediate. PROCEDURE: Informed written consent was obtained from the patient after a thorough discussion of the procedural risks, benefits and alternatives. All questions were  addressed.A timeout was performed prior to the initiation of the procedure. Patient was placed supine on CT scanner. Images through the lower abdomen and pelvis were obtained. The anterior fluid collection was identified and targeted. The anterior abdomen was prepped with chlorhexidine and sterile field was created. Maximal barrier sterile technique was utilized including caps, mask, sterile gowns, sterile gloves, sterile drape, hand hygiene and skin antiseptic. Skin was anesthetized using 1% lidocaine. Small incision was made. Using CT guidance, an 18 gauge trocar needle was directed into the anterior fluid collection. Gray purulent fluid was aspirated. Superstiff Amplatz wire was advanced into the collection and the tract was dilated to accommodate a 10 [place] multipurpose drain. 30 mL of fluid was removed. Drain was flushed with saline and attached to a suction bulb. Drain was sutured to skin. Dressing was placed. FINDINGS: Thick wall fluid collection containing a small amount of gas in the anterior lower abdomen and upper pelvis. 10 French drain was placed and 30 mL of gray purulent-looking fluid was removed. IMPRESSION: CT-guided placement of a drain in the lower abdominal/pelvic abscess. Electronically Signed   By: Markus Daft M.D.   On: 08/06/2021 12:29    Anti-infectives: Anti-infectives (From admission, onward)    Start     Dose/Rate Route Frequency Ordered Stop   08/05/21 2200  piperacillin-tazobactam (ZOSYN) IVPB 2.25 g        2.25 g 100 mL/hr over 30 Minutes Intravenous Every 8 hours 08/05/21 1455     08/04/21 2200  piperacillin-tazobactam (ZOSYN) IVPB 3.375 g  Status:  Discontinued        3.375 g 12.5 mL/hr over 240 Minutes Intravenous Every 8 hours 08/04/21 1927 08/05/21 1455        Assessment/Plan 2 months s/p ex lap with cholecystectomy, SBR, now with recurrent SBO and intra-abdominal fluid collection -drain placement by IR.  Cultures pending but with pan sensitive E.Coli.  cont abx  therapy -SBO vs ileus seems to be improving and film looks better today and clinically looks better.  DC NGT today and give CLD -mobilize as able  FEN - CLD/IVFs VTE - on hold due to UGI bleed ID - zosyn  UGI bleed, see above ESRD on HD CHF Severe MR, TR A fib HTN Sickle Cell disease Anemia   LOS: 8 days    Henreitta Cea , [facility] Surgery 08/08/2021, 10:28 AM Please see Amion for pager number during day hours 7:00am-4:30pm or 7:00am -11:30am on weekends

## 2021-08-09 LAB — AEROBIC/ANAEROBIC CULTURE W GRAM STAIN (SURGICAL/DEEP WOUND)

## 2021-08-09 LAB — CBC
HCT: 20.6 % — ABNORMAL LOW (ref 39.0–52.0)
Hemoglobin: 6.9 g/dL — CL (ref 13.0–17.0)
MCH: 32.9 pg (ref 26.0–34.0)
MCHC: 33.5 g/dL (ref 30.0–36.0)
MCV: 98.1 fL (ref 80.0–100.0)
Platelets: 144 10*3/uL — ABNORMAL LOW (ref 150–400)
RBC: 2.1 MIL/uL — ABNORMAL LOW (ref 4.22–5.81)
RDW: 22.2 % — ABNORMAL HIGH (ref 11.5–15.5)
WBC: 6 10*3/uL (ref 4.0–10.5)
nRBC: 4 % — ABNORMAL HIGH (ref 0.0–0.2)

## 2021-08-09 LAB — PREPARE RBC (CROSSMATCH)

## 2021-08-09 MED ORDER — SODIUM CHLORIDE 0.9% IV SOLUTION: Freq: Once | INTRAVENOUS | Status: AC

## 2021-08-09 MED ORDER — SODIUM CHLORIDE 0.9 % IV SOLN
2.0000 g | INTRAVENOUS | Status: DC
  Administered 2021-08-09 – 2021-08-11 (×3): 2 g via INTRAVENOUS
  Filled 2021-08-09 (×3): qty 20

## 2021-08-09 MED ORDER — ALUM & MAG HYDROXIDE-SIMETH 200-200-20 MG/5ML PO SUSP
30.0000 mL | ORAL | Status: DC | PRN
Start: 2021-08-09 — End: 2021-08-12
  Administered 2021-08-09 – 2021-08-10 (×4): 30 mL via ORAL
  Filled 2021-08-09 (×4): qty 30

## 2021-08-09 MED ORDER — METRONIDAZOLE 500 MG/100ML IV SOLN
500.0000 mg | Freq: Two times a day (BID) | INTRAVENOUS | Status: DC
  Administered 2021-08-09 – 2021-08-11 (×5): 500 mg via INTRAVENOUS
  Filled 2021-08-09 (×5): qty 100

## 2021-08-09 NOTE — Progress Notes (Addendum)
Referring Physician(s): Wilmer Floor PA  Supervising Physician: Mir, Sharen Heck  Patient Status:  [facility] - In-pt  Chief Complaint:  Pelvic abscess s/p LLQ pelvic abscess drain placed on 2.19.23 by Dr. Anselm Pancoast  Subjective:  Pt laying in bed. NAD.  Reports that he is doing fine,  denies fever, chills, abdomina pain, N/V.   Allergies: Patient has no known allergies.  Medications: Prior to Admission medications   Medication Sig Start Date End Date Taking? Authorizing Provider  Acetaminophen (TYLENOL 8 HOUR PO) Patient takes two 500 mg tablets as needed for pain.   Yes [provider]  albuterol (VENTOLIN HFA) 108 (90 Base) MCG/ACT inhaler Inhale 2 puffs into the lungs every 6 (six) hours as needed for wheezing or shortness of breath. 05/09/20  Yes Freddi Starr, MD  allopurinol (ZYLOPRIM) 100 MG tablet TAKE 1 TABLET BY MOUTH EVERY DAY Patient taking differently: Take 100 mg by mouth daily. 07/13/21  Yes Dorena Dew, FNP  AURYXIA 1 GM 210 MG(Fe) tablet Take 420 mg by mouth 3 (three) times daily. 02/11/21  Yes [provider]  folic acid (FOLVITE) 1 MG tablet TAKE 1 TABLET BY MOUTH EVERY DAY Patient taking differently: Take 1 mg by mouth daily. 02/22/21  Yes Dorena Dew, FNP  Glycerin-Hypromellose-PEG 400 (VISINE DRY EYE OP) Place 1 drop into both eyes as needed (dryness).   Yes [provider]  hydroxyurea (HYDREA) 500 MG capsule Take 1 capsule (500 mg total) by mouth daily. TAKE 1 CAPSULE BY MOUTH EVERY DAY WITH FOOD TO MINIMIZE GI SIDE EFFECTS Patient taking differently: Take 500 mg by mouth daily before breakfast. 01/31/21  Yes Dorena Dew, FNP  lidocaine-prilocaine (EMLA) cream Apply 1 application topically daily as needed (port access).  09/08/19  Yes [provider]  oxycodone (OXY-IR) 5 MG capsule Take 2 capsules (10 mg total) by mouth every 6 (six) hours as needed for pain. 07/31/21  Yes Dorena Dew, FNP  pantoprazole  (PROTONIX) 40 MG tablet TAKE 1 TABLET BY MOUTH TWICE A DAY Patient taking differently: Take 40 mg by mouth 2 (two) times daily. 07/26/21  Yes Willia Craze, NP  tiZANidine (ZANAFLEX) 4 MG tablet Take 0.5 tablets (2 mg total) by mouth every 8 (eight) hours as needed for muscle spasms. 07/18/21  Yes Dorena Dew, FNP  torsemide (DEMADEX) 20 MG tablet Take 20 mg by mouth in the morning, at noon, in the evening, and at bedtime.   Yes [provider]  Vitamin D, Ergocalciferol, (DRISDOL) 1.25 MG (50000 UNIT) CAPS capsule TAKE 1 CAPSULE BY MOUTH ONE TIME PER WEEK Patient taking differently: Take 50,000 Units by mouth once a week. Monday 05/15/21  Yes Dorena Dew, FNP  carvedilol (COREG) 3.125 MG tablet Take 1 tablet (3.125 mg total) by mouth 2 (two) times daily. Patient not taking: Reported on 07/31/2021 07/17/21 10/15/21  Rafael Bihari, FNP  gabapentin (NEURONTIN) 100 MG capsule TAKE 1 CAPSULE BY MOUTH EVERYDAY AT BEDTIME Patient not taking: Reported on 07/31/2021 12/12/20   Dorena Dew, FNP     Vital Signs: BP 109/79    Pulse 87    Temp 97.8 F (36.6 C) (Oral)    Resp 20    Ht 5\' 11"  (1.803 m)    Wt 113 lb 1.5 oz (51.3 kg)    SpO2 94%    BMI 15.77 kg/m   Physical Exam Vitals and nursing note reviewed.  Constitutional:      General:  He is not in acute distress.    Appearance: He is well-developed. He is not ill-appearing.  HENT:     Head: Normocephalic and atraumatic.  Pulmonary:     Effort: Pulmonary effort is normal.  Abdominal:     Comments: Positive LLQ  drain to suction. Site is unremarkable with no erythema, edema, tenderness, bleeding or drainage noted at exit site. Suture and stat lock in place. Dressing is clean dry and intact. 10 ml of  purulent tan colored fluid noted in bulb suction device. Drain is able to be flushed easily.    Musculoskeletal:        General: Normal range of motion.  Skin:    General: Skin is warm and dry.     Coloration: Skin is  not jaundiced or pale.  Neurological:     Mental Status: He is alert and oriented to person, place, and time.  Psychiatric:        Behavior: Behavior normal.    Imaging: DG Abd Portable 1V  Result Date: 08/07/2021 CLINICAL DATA:  Small bowel obstruction. EXAM: PORTABLE ABDOMEN - 1 VIEW COMPARISON:  08/04/2021 and CT abdomen 08/04/2021. FINDINGS: Nasogastric tube terminates in the stomach with the side port a few cm beyond the gastroesophageal junction. Placement of a percutaneous drainage catheter in the left anatomic pelvis. Gas is seen in nondilated small bowel and colon. Surgical clips in both upper quadrants. IMPRESSION: 1. Unremarkable bowel gas pattern. 2. Placement of a percutaneous drainage catheter in the left anatomic pelvis. Electronically Signed   By: Lorin Picket M.D.   On: 08/07/2021 08:14   CT IMAGE GUIDED DRAINAGE BY PERCUTANEOUS CATHETER  Result Date: 08/06/2021 INDICATION: 66 year old with bowel obstruction and intra-abdominal abscess. Plan for CT-guided drain placement. Prior abdominal surgery in December 2022. EXAM: CT-guided placement of lower abdominal/pelvic abscess drain MEDICATIONS: Moderate sedation ANESTHESIA/SEDATION: Moderate (conscious) sedation was employed during this procedure. A total of Versed 2.0mg  and fentanyl 100 mcg was administered intravenously at the order of the provider performing the procedure. Total intra-service moderate sedation time: 34 minutes. Patient's level of consciousness and vital signs were monitored continuously by radiology nurse throughout the procedure under the supervision of the provider performing the procedure. COMPLICATIONS: None immediate. PROCEDURE: Informed written consent was obtained from the patient after a thorough discussion of the procedural risks, benefits and alternatives. All questions were addressed.A timeout was performed prior to the initiation of the procedure. Patient was placed supine on CT scanner. Images through  the lower abdomen and pelvis were obtained. The anterior fluid collection was identified and targeted. The anterior abdomen was prepped with chlorhexidine and sterile field was created. Maximal barrier sterile technique was utilized including caps, mask, sterile gowns, sterile gloves, sterile drape, hand hygiene and skin antiseptic. Skin was anesthetized using 1% lidocaine. Small incision was made. Using CT guidance, an 18 gauge trocar needle was directed into the anterior fluid collection. Gray purulent fluid was aspirated. Superstiff Amplatz wire was advanced into the collection and the tract was dilated to accommodate a 10 [place] multipurpose drain. 30 mL of fluid was removed. Drain was flushed with saline and attached to a suction bulb. Drain was sutured to skin. Dressing was placed. FINDINGS: Thick wall fluid collection containing a small amount of gas in the anterior lower abdomen and upper pelvis. 10 French drain was placed and 30 mL of gray purulent-looking fluid was removed. IMPRESSION: CT-guided placement of a drain in the lower abdominal/pelvic abscess. Electronically Signed   By: Markus Daft  M.D.   On: 08/06/2021 12:29    Labs:  CBC: Recent Labs    08/06/21 0219 08/07/21 0435 08/08/21 0123 08/09/21 0213  WBC 9.4 8.9 6.9 6.0  HGB 7.9* 8.1* 7.8* 6.9*  HCT 23.1* 23.1* 22.2* 20.6*  PLT 193 169 154 144*     COAGS: Recent Labs    05/29/21 0113 05/29/21 1327 06/27/21 0830 08/06/21 0219  INR 1.3* 1.3* 1.9* 1.4*  APTT  --  33  --   --      BMP: Recent Labs    08/04/21 0241 08/05/21 0345 08/07/21 0435 08/08/21 0812  NA 136 134* 137 136  K 3.1* 3.8 4.3 3.1*  CL 99 98 99 98  CO2 27 21* 25 26  GLUCOSE 79 75 75 70  BUN 22 34* 24* 29*  CALCIUM 8.7* 8.8* 8.8* 8.7*  CREATININE 2.72* 3.73* 3.63* 4.80*  GFRNONAA 25* 17* 18* 13*     LIVER FUNCTION TESTS: Recent Labs    06/05/21 0350 06/06/21 0719 06/27/21 0830 07/31/21 1611 08/01/21 0758 08/04/21 0241 08/05/21 0345  08/08/21 0812  BILITOT 1.5*  --  3.0* 1.4*  --  1.5*  --   --   AST 20  --  23 14*  --  10*  --   --   ALT 16  --  14 10  --  9  --   --   ALKPHOS 263*  --  563* 237*  --  180*  --   --   PROT 5.4*  --  6.1* 6.5  --  5.9*  --   --   ALBUMIN 1.7*   < > <1.5* <1.5* <1.5* 1.5*   1.5* 1.6* 1.6*   < > = values in this interval not displayed.     Assessment and Plan:  65 y.o. male inpatient. History of CHF, ESRD on HD, sickle cell. Found to have a cute gangrenous cholecystitis s/p ex lap, small bowel resection., cholecystectomy on 05/24/21.   - Presented to the ED on 07/31/21 for anemia due to GIB, received RBCs, s/p EGD on 2/15 which showed hiatal hernia, esophagitis w/ no bleeding, multiple bleeding angioectasias in the stomach, treated with argon plasma coagulation.    - Underwent CT on 08/04/21 due to N/V, found to have a thick-walled fluid collection in the anterior pelvis measuring 9.1 x 5.3 x 4.4 cm, suspicious for abscess, new from CT on 06/27/21.  S/p LLQ drain placement by Dr. Anselm Pancoast on 2/19  - of note, hgb dropped from 7.8 to 6.9 this morning with melanic stool. VSS. GI planning repeat EGD tomorrow.   Drain Location: LLQ Size: Fr size: 10 Fr Date of placement: 08/06/21  Currently to: Drain collection device: suction bulb 24 hour output:  Output by Drain (mL) 08/07/21 0701 - 08/07/21 1900 08/07/21 1901 - 08/08/21 0700 08/08/21 0701 - 08/08/21 1900 08/08/21 1901 - 08/09/21 0700 08/09/21 0701 - 08/09/21 1029  Closed System Drain 1 Groin Bulb (JP) 10 Fr. 10  15 5      Interval imaging/drain manipulation:  None   Current examination: Flushes/aspirates easily.  Insertion site unremarkable. Suture and stat lock in place. Dressed appropriately.   Plan: Continue TID flushes with 5 cc NS. Record output Q shift. Dressing changes QD or PRN if soiled.  Call IR APP or on call IR MD if difficulty flushing or sudden change in drain output.  Repeat imaging/possible drain injection once  output < 10 mL/QD (excluding flush material.)  Discharge planning: Please contact  IR APP or on call IR MD prior to patient d/c to ensure appropriate follow up plans are in place. Typically patient will follow up with IR clinic 10-14 days post d/c for repeat imaging/possible drain injection. IR scheduler will contact patient with date/time of appointment. Patient will need to flush drain QD with 5 cc NS, record output QD, dressing changes every 2-3 days or earlier if soiled.   IR will continue to follow - please call with questions or concerns.    Electronically Signed: Tera Mater, PA-C 08/09/2021, 10:28 AM   I spent a total of 15 Minutes at the patient's bedside AND on the patient's hospital floor or unit, greater than 50% of which was counseling/coordinating care for left pelvic abscess drain.

## 2021-08-09 NOTE — Progress Notes (Addendum)
Daily Rounding Note  08/09/2021, 8:56 AM  LOS: 9 days   SUBJECTIVE:   Chief complaint:    Asked to re-see patient because he has recurrent GI bleeding.  Hgb 8.1 .. 6.3 in last 3 days. Melenic stool yesterday per RN.     HPI: Patrick [name] is a 66 y.o. male.  Complicated patient.  ESRD, on HD.  A-fib, chronic Eliquis.  Pulmonary hypertension.  Sickle cell disease.  CHF.  Legally blind.   Chronic blood loss anemia, multiple previous blood transfusions.  09/2017 colonoscopy for IDA, unexplained GI bleeding.  Small, nonbleeding internal hemorrhoids otherwise normal.  09/2017 EGD.  Erosive gastritis, benign biopsies 07/2020 EGD.  For blood loss anemia, hematemesis.  Ectopic gastric mucosa in proximal esophagus.  Nonbleeding erosive gastropathy, gastric erythema, biopsied (benign, no H. Pylori). Small HH.  Normal duodenal bulb and second duodenum 11/ 26/2022 ERCP w sphincerotomy and baloon clearance debris at [facility].  PD and CBD stent placed.  Had SBO associated with choledocholithiasis. Recurrent SBO.  05/24/2021 diagnostic laparoscopy, small bowel resection with primary anastomosis, open cholecystectomy.  Bilious ascites noted throughout the abdomen.  Segment of small bowel adhered to pelvis that was dilated, friable and looked like chronic ischemia.  GB looked like acute cholecystitis.  SB Path: fibrosis, stricture.  GB path: acute/chronic cholecystitis, cholelithiasis. 05/29/2021 EGD for melena.  Old blood clot and fresh blood in stomach precluding visualization of proximal stomach.  Grade C ulcerative esophagitis.  Active oozing/briskly bleeding AVM at lesser curvature, treated with APC, 3 endoclips, epinephrine.  Plastic stent from biliary tract and pancreatic duct removed. 06/28/2021 EGD: for melena, Hgb 4.7.  Patchy, yellow plaques in esophagus consistent with candidiasis.  Grade B esophagitis at GE junction., no bleeding.   Nonobstructing Schatzki ring at GE junction.  3 cm HH.  Hemorrhagic gastritis treated with APC.  However gastritis too diffuse for targeted ablation.  H. pylori biopsy.  Retained endoclips noted.  Normal examined duodenum. 06/27/20 CTAP: Moderate to large volume ascites increased compared with 4 weeks ago.  Small bilateral pleural effusions.  Postsurgical changes C/W SB resection.  No intestinal obstruction. After this, Eliquis discontinued.    Admitted 07/31/2020 with recurrent SBO, intra-abdominal fluid collection.  Recurrent anemia, Hgb 5.2. N/V.  s/p PRBCs.    08/02/2020 EGD.   HH.  Nonobstructing Schatzki's ring.  Grade a esophagitis, no associated bleeding.  APC treatment of multiple bleeding gastric AVMs.  Retained Endo Clip in stomach.  Normal examined duodenum. 08/06/2020 IR placed drain into pelvic fluid collection. Got # PRBCs on 2/13-2/14.   Now w recurrent (vs ongoing melena) and dropping Hgb.  Getting 4th PRBC. Denies n/v, dizziness, SOB.  + abd pain from abscess, wound.   Was made n.p.o. and missed last night's dinner and breakfast this morning so he is hungry.    Compliant w Protonix 40 mg po bid. Carafate not in use due to ESRD.  On Ferric citrate.     OBJECTIVE:         Vital signs in last 24 hours:    Temp:  [97.1 F (36.2 C)-98.1 F (36.7 C)] 97.5 F (36.4 C) (02/22 0756) Pulse Rate:  [73-99] 84 (02/22 0756) Resp:  [14-18] 14 (02/22 0756) BP: (91-109)/(64-72) 107/70 (02/22 0756) SpO2:  [95 %-100 %] 100 % (02/22 0727) Weight:  [51.3 kg] 51.3 kg (02/21 1050) Last BM Date : 08/08/21 Filed Weights   08/08/21 0516 08/08/21 0732 08/08/21 1050  Weight: 56.5 kg 52.4 kg 51.3 kg   General: Pleasant, comfortable.  Does not look toxic or septic. Heart: RRR. Chest: No labored breathing or cough Abdomen: Soft.  Nontender.  Scabbed over area of visible midline wound, bandage not removed to look at entire wound.  IR drain in place with some serous/purulent output.  No blood seen  in JP drain. Extremities: No CCE. Neuro/Psych: Does not, cooperative.  Fluid speech.  Intake/Output from previous day: 02/21 0701 - 02/22 0700 In: 605 [P.O.:600] Out: 1020 [Drains:20]  Intake/Output this shift: No intake/output data recorded.  Lab Results: Recent Labs    08/07/21 0435 08/08/21 0123 08/09/21 0213  WBC 8.9 6.9 6.0  HGB 8.1* 7.8* 6.9*  HCT 23.1* 22.2* 20.6*  PLT 169 154 144*   BMET Recent Labs    08/07/21 0435 08/08/21 0812  NA 137 136  K 4.3 3.1*  CL 99 98  CO2 25 26  GLUCOSE 75 70  BUN 24* 29*  CREATININE 3.63* 4.80*  CALCIUM 8.8* 8.7*   LFT Recent Labs    08/08/21 0812  ALBUMIN 1.6*   PT/INR No results for input(s): LABPROT, INR in the last 72 hours. Hepatitis Panel No results for input(s): HEPBSAG, HCVAB, HEPAIGM, HEPBIGM in the last 72 hours.  Studies/Results: No results found.  ASSESMENT:   RECURRENT upper GI bleed in patient with hx multiple GI bleeds due to gastric AVMs.  Had APC ablation of multiple AVMs as recently as 1 week ago.  Compliant with bid Protonix.  Switched from oral to IV formulation yesterday  Blood loss anemia and anemia from sickle cell disease and ESRD.  On Aranesp every Saturday, oral ferric citrate.      SBO.  Intra-abdominal fluid collection/abscess growing E. coli.  Status post IR drainage.  ESRD.  HD days are TTS.  PLAN   Plan EGD tomorrow.  Today's endoscopy schedule will not allow him to go until after 5 PM so best to defer procedure till tomorrow.  Follow-up CBC after transfusion, give more blood if necessary.  Not sure IV Protonix will confer any more benefit than PO Protonix but will leave the IV formulation in place for now.    Azucena Freed  08/09/2021, 8:56 AM Phone [phone]     Attending physician's note   I have taken history, reviewed the chart and examined the patient. I performed a substantive portion of this encounter, including complete performance of at least one of the key  components, in conjunction with the APP. I agree with the Advanced Practitioner's note, impression and recommendations.   Known to GI service from recent consult Very complicated patient.  See details above  Pt with recurrent UGI bleed d/t gastric AVMs s/p ablation a week ago with recurrent bleeding HD stable. Hb 8 to 6.3 with recurrent melena  Reasonable to have another EGD (to determine if any lesions would require retreatment) Trend CBC Continue IV Protonix Discussed risks and benefits of EGD with patient in detail.  He agrees to proceed.    Carmell Austria, MD [facility] [phone]

## 2021-08-09 NOTE — Progress Notes (Signed)
PROGRESS NOTE        PATIENT DETAILS Name: Patrick [name] Age: 66 y.o. Sex: male Date of Birth: [birth year] Admit Date: 07/31/2021 Admitting Physician Eben Burow, MD NUU:[id number], Asencion Partridge, FNP  Brief Summary: 66 year old male with history of ESRD on HD, sickle cell disease, permanent atrial fibrillation, history of UGI due to AVMs, recent history (December 2022) of choledocholithiasis and ischemic small bowel requiring cholecystectomy/biliary stent placement/bowel resection-referred to the ED from HD center for hemoglobin of 5.8-subsequent EGD on 2/15 showed bleeding AVMs.  Hospital course complicated by persistent nausea and vomiting-upon further evaluation-found to have SBO and pelvic abscess.  Significant events: 2/13>> referred to the ED from HD center for evaluation of hemoglobin of 5.8.   2/17>> ?SBO/Abscess on CT abdomen/pelvis -General surgery consulted 2/19>> percutaneous pelvic drain placed; worsening chronic back pain 2/20>> back pain moderately improving on current regimen, minimal NG output, scant pelvic drain output overnight 2/22>> redeveloped melena-hemoglobin down to 6.9-PRBC transfusion  Significant imaging studies: 2/17>> MRI LS spine: Limited exams-no acute abnormalities. 2/17>> CT abd/pelvis: Questionable SBO and abcess  Significant microbiology data: 2/13>> COVID/influenza PCR: Negative 2/19>> pelvic abscess culture: E. coli/Bacteroides  Procedures: 2/15>> EGD: Multiple bleeding angiectasia's in the stomach treated with argon plasma coagulation 2/19>> Pelvic fluid drain placed, 10 [place]  Consults: GI, nephrology, Gen Sx  Subjective: Denies any abdominal pain-no nausea-had 1 black tarry stool earlier this morning.  Objective: Vitals: Blood pressure 110/70, pulse 90, temperature (!) 97.3 F (36.3 C), temperature source Oral, resp. rate 16, height 5\' 11"  (1.803 m), weight 51.3 kg, SpO2 98 %.   Exam: Gen Exam:Alert  awake-not in any distress HEENT:atraumatic, normocephalic Chest: B/L clear to auscultation anteriorly CVS:S1S2 regular Abdomen:soft non tender, non distended.  Midline incision with a small scarred opening.  Pelvic drain in place. Extremities:no edema Neurology: Non focal Skin: no rash     Pertinent Labs/Radiology: CBC Latest Ref Rng & Units 08/09/2021 08/08/2021 08/07/2021  WBC 4.0 - 10.5 K/uL 6.0 6.9 8.9  Hemoglobin 13.0 - 17.0 g/dL 6.9(LL) 7.8(L) 8.1(L)  Hematocrit 39.0 - 52.0 % 20.6(L) 22.2(L) 23.1(L)  Platelets 150 - 400 K/uL 144(L) 154 169    Lab Results  Component Value Date   NA 136 08/08/2021   K 3.1 (L) 08/08/2021   CL 98 08/08/2021   CO2 26 08/08/2021      Assessment/Plan: SBO with pelvic abscess: Managed conservatively-with NG tube/n.p.o. status-clinically improved after draining pelvic abscess.  Bowel function returning-diet being advanced.  Pelvic drain remains in place-abscess cultures positive for E. coli/Bacteroides-can switch to Rocephin/Flagyl.   Upper GI bleeding with acute blood loss anemia due to AVMs:  EGD on 2/15 showed bleeding AVMs-hemoglobin had stabilized after 3 units of PRBC-unfortunately-melena and acute blood loss anemia reoccurred on 2/22.  Being transfused another unit of PRBC today-follow CBC closely-remains on IV PPI twice daily dosing.  GI reconsulted-tentative plans for EGD tomorrow morning.    Recent history of choledocholithiasis-s/p ERCP with pancreatic/biliary stents (removed) followed by open cholecystectomy and resection of ischemic small bowel on 05/24/2021  ESRD: On HD per nephrology.  Chronic HFrEF (EF 40-45% by echo December 2022): Volume status stable-volume removal with HD.  Permanent atrial fibrillation: Rate controlled with Coreg-not a candidate for anticoagulation due to recurrent GI bleeding.  HTN: BP stable-not on any antihypertensives.  Sickle cell disease/Lumbago: Stable-MRI of LS spine without  any acute abnormalities  although a poor study.  Continue supportive care.  Benign neurological exam.  Legally blind  BMI: Estimated body mass index is 15.77 kg/m as calculated from the following:   Height as of this encounter: 5\' 11"  (1.803 m).   Weight as of this encounter: 51.3 kg.   Code status:   Code Status: Full Code   DVT Prophylaxis: SCDs Start: 07/31/21 2024   Family Communication: None at bedside  Disposition Plan: Status is: Inpatient Remains inpatient appropriate because: Recurrent upper GI bleeding with acute blood loss anemia requiring transfusion on 2/22-likely needs another few more days of hospitalization before consideration of discharge.  Planned Discharge Destination:Home   Diet: Diet Order             DIET SOFT Room service appropriate? Yes; Fluid consistency: Thin  Diet effective now                   Antimicrobial agents: Anti-infectives (From admission, onward)    Start     Dose/Rate Route Frequency Ordered Stop   08/05/21 2200  piperacillin-tazobactam (ZOSYN) IVPB 2.25 g        2.25 g 100 mL/hr over 30 Minutes Intravenous Every 8 hours 08/05/21 1455     08/04/21 2200  piperacillin-tazobactam (ZOSYN) IVPB 3.375 g  Status:  Discontinued        3.375 g 12.5 mL/hr over 240 Minutes Intravenous Every 8 hours 08/04/21 1927 08/05/21 1455        MEDICATIONS: Scheduled Meds:  (feeding supplement) PROSource Plus  30 mL Oral BID BM   allopurinol  100 mg Oral Daily   Chlorhexidine Gluconate Cloth  6 each Topical Q0600   darbepoetin (ARANESP) injection - DIALYSIS  200 mcg Intravenous Q Sat-HD   ferric citrate  420 mg Oral TID with meals   folic acid  1 mg Oral Daily   hydroxyurea  500 mg Oral Daily   pantoprazole (PROTONIX) IV  40 mg Intravenous Q12H   polyethylene glycol  17 g Oral Daily   sodium chloride flush  3 mL Intravenous Q12H   sodium chloride flush  5 mL Intracatheter Q8H   Continuous Infusions:  sodium chloride     sodium chloride     sodium chloride      sodium chloride     sodium chloride     piperacillin-tazobactam (ZOSYN)  IV 2.25 g (08/09/21 0643)   promethazine (PHENERGAN) injection (IM or IVPB) Stopped (08/02/21 0135)   PRN Meds:.sodium chloride, sodium chloride, sodium chloride, sodium chloride, acetaminophen **OR** acetaminophen, alteplase, alum & mag hydroxide-simeth, heparin, heparin, HYDROmorphone (DILAUDID) injection, lidocaine (PF), lidocaine (PF), lidocaine-prilocaine, lidocaine-prilocaine, ondansetron **OR** ondansetron (ZOFRAN) IV, oxyCODONE, pentafluoroprop-tetrafluoroeth, pentafluoroprop-tetrafluoroeth, promethazine (PHENERGAN) injection (IM or IVPB), sodium chloride flush   I have personally reviewed following labs and imaging studies  LABORATORY DATA: CBC: Recent Labs  Lab 08/03/21 0504 08/03/21 1838 08/04/21 0241 08/04/21 1649 08/05/21 0942 08/06/21 0219 08/07/21 0435 08/08/21 0123 08/09/21 0213  WBC 14.8*   < > 9.6   < > 12.5* 9.4 8.9 6.9 6.0  NEUTROABS 12.5*  --  7.6  --   --   --   --   --   --   HGB 8.4*   < > 7.8*   < > 7.3* 7.9* 8.1* 7.8* 6.9*  HCT 24.6*   < > 22.1*   < > 21.6* 23.1* 23.1* 22.2* 20.6*  MCV 96.5   < > 95.7   < > 98.6 99.1 98.7  98.2 98.1  PLT 253   < > 208   < > 210 193 169 154 144*   < > = values in this interval not displayed.     Basic Metabolic Panel: Recent Labs  Lab 08/04/21 0241 08/05/21 0345 08/07/21 0435 08/08/21 0812  NA 136 134* 137 136  K 3.1* 3.8 4.3 3.1*  CL 99 98 99 98  CO2 27 21* 25 26  GLUCOSE 79 75 75 70  BUN 22 34* 24* 29*  CREATININE 2.72* 3.73* 3.63* 4.80*  CALCIUM 8.7* 8.8* 8.8* 8.7*  PHOS 3.3 3.9  --  4.3     GFR: Estimated Creatinine Clearance: 11.4 mL/min (A) (by C-G formula based on SCr of 4.8 mg/dL (H)).  Liver Function Tests: Recent Labs  Lab 08/04/21 0241 08/05/21 0345 08/08/21 0812  AST 10*  --   --   ALT 9  --   --   ALKPHOS 180*  --   --   BILITOT 1.5*  --   --   PROT 5.9*  --   --   ALBUMIN 1.5*   1.5* 1.6* 1.6*    No  results for input(s): LIPASE, AMYLASE in the last 168 hours. No results for input(s): AMMONIA in the last 168 hours.  Coagulation Profile: Recent Labs  Lab 08/06/21 0219  INR 1.4*     Cardiac Enzymes: No results for input(s): CKTOTAL, CKMB, CKMBINDEX, TROPONINI in the last 168 hours.  BNP (last 3 results) No results for input(s): PROBNP in the last 8760 hours.  Lipid Profile: No results for input(s): CHOL, HDL, LDLCALC, TRIG, CHOLHDL, LDLDIRECT in the last 72 hours.  Thyroid Function Tests: No results for input(s): TSH, T4TOTAL, FREET4, T3FREE, THYROIDAB in the last 72 hours.  Anemia Panel: No results for input(s): VITAMINB12, FOLATE, FERRITIN, TIBC, IRON, RETICCTPCT in the last 72 hours.  Urine analysis:    Component Value Date/Time   COLORURINE STRAW (A) 06/02/2018 1900   APPEARANCEUR CLEAR 06/02/2018 1900   APPEARANCEUR Clear 05/31/2017 0902   LABSPEC 1.006 06/02/2018 1900   PHURINE 7.0 06/02/2018 1900   GLUCOSEU NEGATIVE 06/02/2018 1900   HGBUR SMALL (A) 06/02/2018 1900   BILIRUBINUR neg 01/26/2020 1124   BILIRUBINUR Negative 05/31/2017 0902   KETONESUR negative 09/08/2018 1621   KETONESUR NEGATIVE 06/02/2018 1900   PROTEINUR Positive (A) 01/26/2020 1124   PROTEINUR 30 (A) 06/02/2018 1900   UROBILINOGEN 1.0 01/26/2020 1124   UROBILINOGEN 0.2 09/14/2013 1354   NITRITE neg 01/26/2020 1124   NITRITE NEGATIVE 06/02/2018 1900   LEUKOCYTESUR Negative 01/26/2020 1124   LEUKOCYTESUR Negative 05/31/2017 0902    Sepsis Labs: Lactic Acid, Venous    Component Value Date/Time   LATICACIDVEN 1.3 06/27/2021 0830    MICROBIOLOGY: Recent Results (from the past 240 hour(s))  Resp Panel by RT-PCR (Flu A&B, Covid) Nasopharyngeal Swab     Status: None   Collection Time: 07/31/21 12:14 AM   Specimen: Nasopharyngeal Swab; Nasopharyngeal(NP) swabs in vial transport medium  Result Value Ref Range Status   SARS Coronavirus 2 by RT PCR NEGATIVE NEGATIVE Final    Comment:  (NOTE) SARS-CoV-2 target nucleic acids are NOT DETECTED.  The SARS-CoV-2 RNA is generally detectable in upper respiratory specimens during the acute phase of infection. The lowest concentration of SARS-CoV-2 viral copies this assay can detect is 138 copies/mL. A negative result does not preclude SARS-Cov-2 infection and should not be used as the sole basis for treatment or other patient management decisions. A negative result may occur  with  improper specimen collection/handling, submission of specimen other than nasopharyngeal swab, presence of viral mutation(s) within the areas targeted by this assay, and inadequate number of viral copies(<138 copies/mL). A negative result must be combined with clinical observations, patient history, and epidemiological information. The expected result is Negative.  Fact Sheet for Patients:  EntrepreneurPulse.com.au  Fact Sheet for Healthcare Providers:  IncredibleEmployment.be  This test is no t yet approved or cleared by the [place] FDA and  has been authorized for detection and/or diagnosis of SARS-CoV-2 by FDA under an Emergency Use Authorization (EUA). This EUA will remain  in effect (meaning this test can be used) for the duration of the COVID-19 declaration under Section 564(b)(1) of the Act, 21 U.S.C.section 360bbb-3(b)(1), unless the authorization is terminated  or revoked sooner.       Influenza A by PCR NEGATIVE NEGATIVE Final   Influenza B by PCR NEGATIVE NEGATIVE Final    Comment: (NOTE) The Xpert Xpress SARS-CoV-2/FLU/RSV plus assay is intended as an aid in the diagnosis of influenza from Nasopharyngeal swab specimens and should not be used as a sole basis for treatment. Nasal washings and aspirates are unacceptable for Xpert Xpress SARS-CoV-2/FLU/RSV testing.  Fact Sheet for Patients: EntrepreneurPulse.com.au  Fact Sheet for Healthcare  Providers: IncredibleEmployment.be  This test is not yet approved or cleared by the [place] FDA and has been authorized for detection and/or diagnosis of SARS-CoV-2 by FDA under an Emergency Use Authorization (EUA). This EUA will remain in effect (meaning this test can be used) for the duration of the COVID-19 declaration under Section 564(b)(1) of the Act, 21 U.S.C. section 360bbb-3(b)(1), unless the authorization is terminated or revoked.  Performed at [facility], [place] [address], [place], [place] [address]   MRSA Next Gen by PCR, Nasal     Status: None   Collection Time: 08/01/21  5:54 AM   Specimen: Nasal Mucosa; Nasal Swab  Result Value Ref Range Status   MRSA by PCR Next Gen NOT DETECTED NOT DETECTED Final    Comment: (NOTE) The GeneXpert MRSA Assay (FDA approved for NASAL specimens only), is one component of a comprehensive MRSA colonization surveillance program. It is not intended to diagnose MRSA infection nor to guide or monitor treatment for MRSA infections. Test performance is not FDA approved in patients less than 64 years old. Performed at [facility], [place] [address] [place], [place] [address]   Aerobic/Anaerobic Culture w Gram Stain (surgical/deep wound)     Status: None   Collection Time: 08/06/21 11:10 AM   Specimen: Abscess  Result Value Ref Range Status   Specimen Description ABSCESS  Final   Special Requests NONE  Final   Gram Stain   Final    ABUNDANT WBC PRESENT, PREDOMINANTLY PMN ABUNDANT GRAM NEGATIVE RODS RARE GRAM POSITIVE COCCI    Culture   Final    FEW ESCHERICHIA COLI ABUNDANT BACTEROIDES FRAGILIS BETA LACTAMASE POSITIVE Performed at [facility], [place] [address] [place],  [address]    Report Status 08/09/2021 FINAL  Final   Organism ID, Bacteria ESCHERICHIA COLI  Final      Susceptibility   Escherichia coli - MIC*    AMPICILLIN <=2 SENSITIVE Sensitive     CEFAZOLIN <=4 SENSITIVE  Sensitive     CEFEPIME <=0.12 SENSITIVE Sensitive     CEFTAZIDIME <=1 SENSITIVE Sensitive     CEFTRIAXONE <=0.25 SENSITIVE Sensitive     CIPROFLOXACIN <=0.25 SENSITIVE Sensitive     GENTAMICIN <=  1 SENSITIVE Sensitive     IMIPENEM <=0.25 SENSITIVE Sensitive     TRIMETH/SULFA <=20 SENSITIVE Sensitive     AMPICILLIN/SULBACTAM <=2 SENSITIVE Sensitive     PIP/TAZO <=4 SENSITIVE Sensitive     * FEW ESCHERICHIA COLI    RADIOLOGY STUDIES/RESULTS: No results found.   LOS: 9 days   Oren Binet, DO  [facility]  To contact the attending provider between 7A-7P or the covering provider during after hours 7P-7A, please log into the web site [email or web address] and access using universal [place] password for that web site. If you do not have the password, please call the hospital operator.  08/09/2021, 1:09 PM

## 2021-08-09 NOTE — Progress Notes (Signed)
7 Days Post-Op  Subjective: Patient feels great.  No nausea, vomiting with CLD.  Would like more to eat.  Having BMs.  Unbeknownst to myself or the patient (he's mostly blind), apparently his last stool with melanic per dayshift RN.  Apparently night RN didn't document, but in speaking to primary team did call on call MD this morning.  He is receiving 1 unit of pRBCs this morning.  ROS: See above, otherwise other systems negative  Objective: Vital signs in last 24 hours: Temp:  [97.1 F (36.2 C)-98.1 F (36.7 C)] 97.5 F (36.4 C) (02/22 0756) Pulse Rate:  [73-99] 84 (02/22 0756) Resp:  [14-18] 14 (02/22 0756) BP: (91-109)/(64-72) 107/70 (02/22 0756) SpO2:  [95 %-100 %] 100 % (02/22 0727) Weight:  [51.3 kg] 51.3 kg (02/21 1050) Last BM Date : 08/08/21  Intake/Output from previous day: 02/21 0701 - 02/22 0700 In: 605 [P.O.:600] Out: 1020 [Drains:20] Intake/Output this shift: No intake/output data recorded.  PE: Gen: NAD, laying in bed Abd: soft, NT, ND, midline wound almost completely healed.  Small area of scabbing still present. IR drain in place with some serous output noted.  20cc/24hrs   Lab Results:  Recent Labs    08/08/21 0123 08/09/21 0213  WBC 6.9 6.0  HGB 7.8* 6.9*  HCT 22.2* 20.6*  PLT 154 144*   BMET Recent Labs    08/07/21 0435 08/08/21 0812  NA 137 136  K 4.3 3.1*  CL 99 98  CO2 25 26  GLUCOSE 75 70  BUN 24* 29*  CREATININE 3.63* 4.80*  CALCIUM 8.8* 8.7*   PT/INR No results for input(s): LABPROT, INR in the last 72 hours.  CMP     Component Value Date/Time   NA 136 08/08/2021 0812   NA 135 05/24/2020 1119   K 3.1 (L) 08/08/2021 0812   CL 98 08/08/2021 0812   CO2 26 08/08/2021 0812   GLUCOSE 70 08/08/2021 0812   BUN 29 (H) 08/08/2021 0812   BUN 25 05/24/2020 1119   CREATININE 4.80 (H) 08/08/2021 0812   CREATININE 2.90 (H) 08/11/2015 1359   CALCIUM 8.7 (L) 08/08/2021 0812   PROT 5.9 (L) 08/04/2021 0241   PROT 7.5 05/24/2020  1119   ALBUMIN 1.6 (L) 08/08/2021 0812   ALBUMIN 4.1 05/24/2020 1119   AST 10 (L) 08/04/2021 0241   ALT 9 08/04/2021 0241   ALKPHOS 180 (H) 08/04/2021 0241   BILITOT 1.5 (H) 08/04/2021 0241   BILITOT 2.4 (H) 05/24/2020 1119   GFRNONAA 13 (L) 08/08/2021 0812   GFRNONAA 23 (L) 08/11/2015 1359   GFRAA 24 (L) 05/24/2020 1119   GFRAA 27 (L) 08/11/2015 1359   Lipase     Component Value Date/Time   LIPASE 30 06/27/2021 0830       Studies/Results: No results found.  Anti-infectives: Anti-infectives (From admission, onward)    Start     Dose/Rate Route Frequency Ordered Stop   08/05/21 2200  piperacillin-tazobactam (ZOSYN) IVPB 2.25 g        2.25 g 100 mL/hr over 30 Minutes Intravenous Every 8 hours 08/05/21 1455     08/04/21 2200  piperacillin-tazobactam (ZOSYN) IVPB 3.375 g  Status:  Discontinued        3.375 g 12.5 mL/hr over 240 Minutes Intravenous Every 8 hours 08/04/21 1927 08/05/21 1455        Assessment/Plan 2 months s/p ex lap with cholecystectomy, SBR, now with recurrent SBO and intra-abdominal fluid collection -drain placement by IR.  Cultures pending but with pan sensitive E.Coli.  cont abx therapy -SBO vs ileus seems to be improving.  Would advance diet but in setting of GI bleed again, will hold and defer to GI for further recommendations -mobilize as able  FEN - CLD/IVFs, may  need to be NPO if GI wants to scope VTE - on hold due to UGI bleed ID - zosyn  UGI bleed, receiving a unit of pRBCs this am, defer to medical team and GI at this time. ESRD on HD CHF Severe MR, TR A fib HTN Sickle Cell disease Anemia   LOS: 9 days    Henreitta Cea , [facility] 08/09/2021, 8:38 AM Please see Amion for pager number during day hours 7:00am-4:30pm or 7:00am -11:30am on weekends

## 2021-08-09 NOTE — Progress Notes (Signed)
[facility] Progress Note   Subjective:  Seen in room - developed melena overnight with Hgb down to 6.9 - getting 1U PRBCs today and GI has seen him with plan for EGD tomorrow. Denies CP/dyspnea.  Objective Vitals:   08/09/21 0727 08/09/21 0756 08/09/21 0929 08/09/21 1113  BP: 99/66 107/70 109/79 110/70  Pulse: 85 84 87 90  Resp: 18 14 20 16   Temp: 97.6 F (36.4 C) (!) 97.5 F (36.4 C) 97.8 F (36.6 C) (!) 97.3 F (36.3 C)  TempSrc: Oral Axillary Oral Oral  SpO2: 100%  94% 98%  Weight:      Height:       Physical Exam General: Frail appearing man, room air.  Heart: RRR; no murmur Lungs: CTA anteriorly Abdomen: soft, drain to LLQ with purulent output Extremities: No LE edema Dialysis Access:  RUE AVF + bruit  Additional Objective Labs: Basic Metabolic Panel: Recent Labs  Lab 08/04/21 0241 08/05/21 0345 08/07/21 0435 08/08/21 0812  NA 136 134* 137 136  K 3.1* 3.8 4.3 3.1*  CL 99 98 99 98  CO2 27 21* 25 26  GLUCOSE 79 75 75 70  BUN 22 34* 24* 29*  CREATININE 2.72* 3.73* 3.63* 4.80*  CALCIUM 8.7* 8.8* 8.8* 8.7*  PHOS 3.3 3.9  --  4.3   Liver Function Tests: Recent Labs  Lab 08/04/21 0241 08/05/21 0345 08/08/21 0812  AST 10*  --   --   ALT 9  --   --   ALKPHOS 180*  --   --   BILITOT 1.5*  --   --   PROT 5.9*  --   --   ALBUMIN 1.5*   1.5* 1.6* 1.6*   CBC: Recent Labs  Lab 08/03/21 0504 08/03/21 1838 08/04/21 0241 08/04/21 1649 08/05/21 0942 08/06/21 0219 08/07/21 0435 08/08/21 0123 08/09/21 0213  WBC 14.8*   < > 9.6   < > 12.5* 9.4 8.9 6.9 6.0  NEUTROABS 12.5*  --  7.6  --   --   --   --   --   --   HGB 8.4*   < > 7.8*   < > 7.3* 7.9* 8.1* 7.8* 6.9*  HCT 24.6*   < > 22.1*   < > 21.6* 23.1* 23.1* 22.2* 20.6*  MCV 96.5   < > 95.7   < > 98.6 99.1 98.7 98.2 98.1  PLT 253   < > 208   < > 210 193 169 154 144*   < > = values in this interval not displayed.   Medications:  sodium chloride     sodium chloride     sodium chloride      sodium chloride     sodium chloride     piperacillin-tazobactam (ZOSYN)  IV 2.25 g (08/09/21 0643)   promethazine (PHENERGAN) injection (IM or IVPB) Stopped (08/02/21 0135)    (feeding supplement) PROSource Plus  30 mL Oral BID BM   allopurinol  100 mg Oral Daily   Chlorhexidine Gluconate Cloth  6 each Topical Q0600   darbepoetin (ARANESP) injection - DIALYSIS  200 mcg Intravenous Q Sat-HD   ferric citrate  420 mg Oral TID with meals   folic acid  1 mg Oral Daily   hydroxyurea  500 mg Oral Daily   pantoprazole (PROTONIX) IV  40 mg Intravenous Q12H   polyethylene glycol  17 g Oral Daily   sodium chloride flush  3 mL Intravenous Q12H   sodium chloride flush  5 mL Intracatheter Q8H    Dialysis Orders: TTS - AF 4hrs, 400/500,  EDW 55.5kg, 3K/ 2Ca UFP2  RU AVF no heparin - Mircera 225 mcg q2wks - last 07/20/21 - Hectorol 46mcg IV qHD   - Sensipar 30mg  qHD   Assessment/Plan:  SBO with Abdominal abscess: NG tube now out. S/p abscess drain placement 2/19 -> Cx grew E.Coli -> started on IV ZOsyn. Symptomatic acute on chronic anemia of CKD - Hx recurrent GIB. Hgb 5.2 on admit s/p 2 unit pRBC improved to 7.6, then drop to 6.6 post HD.  Given another 1U PRBCs on 2/14. Hgb now dropping again with melena - getting another 1U blood today and GI team planning repeat EGD tomorrow. Continues Aranesp 275mcg q Saturday.  ESRD: Continue HD per usual TTS schedule - next tomorrow. Hypokalemia: Using higher K bath with HD.  Hypotension/volume: BP runs soft.  Avoiding midodrine with prolonged QT. Secondary Hyperparathyroidism: Corrected calcium is high, holding VDRA.  Phos ok, continue binders and restart sensipar once tolerating PO. Severe protein calorie malnutrition: Slowly advancing diet, protein supplements ordered. A fib - not on eliquis d/t GIB Sickle cell anemia - chronic, having back pain this admission    Veneta Penton, PA-C 08/09/2021, 11:17 AM  [facility]

## 2021-08-09 NOTE — Progress Notes (Signed)
Physical Therapy Note  Orders received, chart reviewed;  Noted PT eval from 2/16, and pt was independent, without PT needs;  Discussed pt status with NT and RN, who report he is moving well;  No further PT needs noted;  Roney Marion, [place] Pager [phone] Office [phone]

## 2021-08-09 NOTE — Plan of Care (Signed)
°  Problem: Education: Goal: Knowledge of General Education information will improve Description: Including pain rating scale, medication(s)/side effects and non-pharmacologic comfort measures Outcome: Progressing   Problem: Health Behavior/Discharge Planning: Goal: Ability to manage health-related needs will improve Outcome: Progressing   Problem: Clinical Measurements: Goal: Will remain free from infection Outcome: Progressing Goal: Respiratory complications will improve Outcome: Progressing Goal: Cardiovascular complication will be avoided Outcome: Progressing   Problem: Coping: Goal: Level of anxiety will decrease Outcome: Progressing   Problem: Elimination: Goal: Will not experience complications related to bowel motility Outcome: Progressing Goal: Will not experience complications related to urinary retention Outcome: Progressing   Problem: Pain Managment: Goal: General experience of comfort will improve Outcome: Progressing   Problem: Safety: Goal: Ability to remain free from injury will improve Outcome: Progressing   Problem: Skin Integrity: Goal: Risk for impaired skin integrity will decrease Outcome: Progressing   Problem: Clinical Measurements: Goal: Ability to maintain clinical measurements within normal limits will improve Outcome: Not Progressing Goal: Diagnostic test results will improve Outcome: Not Progressing   Problem: Activity: Goal: Risk for activity intolerance will decrease Outcome: Not Progressing   Problem: Nutrition: Goal: Adequate nutrition will be maintained Outcome: Not Progressing

## 2021-08-09 NOTE — H&P (View-Only) (Signed)
Daily Rounding Note  08/09/2021, 8:56 AM  LOS: 9 days   SUBJECTIVE:   Chief complaint:    Asked to re-see patient because he has recurrent GI bleeding.  Hgb 8.1 .. 6.3 in last 3 days. Melenic stool yesterday per RN.     HPI: Patrick [name] is a 66 y.o. male.  Complicated patient.  ESRD, on HD.  A-fib, chronic Eliquis.  Pulmonary hypertension.  Sickle cell disease.  CHF.  Legally blind.   Chronic blood loss anemia, multiple previous blood transfusions.  09/2017 colonoscopy for IDA, unexplained GI bleeding.  Small, nonbleeding internal hemorrhoids otherwise normal.  09/2017 EGD.  Erosive gastritis, benign biopsies 07/2020 EGD.  For blood loss anemia, hematemesis.  Ectopic gastric mucosa in proximal esophagus.  Nonbleeding erosive gastropathy, gastric erythema, biopsied (benign, no H. Pylori). Small HH.  Normal duodenal bulb and second duodenum 11/ 26/2022 ERCP w sphincerotomy and baloon clearance debris at [facility].  PD and CBD stent placed.  Had SBO associated with choledocholithiasis. Recurrent SBO.  05/24/2021 diagnostic laparoscopy, small bowel resection with primary anastomosis, open cholecystectomy.  Bilious ascites noted throughout the abdomen.  Segment of small bowel adhered to pelvis that was dilated, friable and looked like chronic ischemia.  GB looked like acute cholecystitis.  SB Path: fibrosis, stricture.  GB path: acute/chronic cholecystitis, cholelithiasis. 05/29/2021 EGD for melena.  Old blood clot and fresh blood in stomach precluding visualization of proximal stomach.  Grade C ulcerative esophagitis.  Active oozing/briskly bleeding AVM at lesser curvature, treated with APC, 3 endoclips, epinephrine.  Plastic stent from biliary tract and pancreatic duct removed. 06/28/2021 EGD: for melena, Hgb 4.7.  Patchy, yellow plaques in esophagus consistent with candidiasis.  Grade B esophagitis at GE junction., no bleeding.   Nonobstructing Schatzki ring at GE junction.  3 cm HH.  Hemorrhagic gastritis treated with APC.  However gastritis too diffuse for targeted ablation.  H. pylori biopsy.  Retained endoclips noted.  Normal examined duodenum. 06/27/20 CTAP: Moderate to large volume ascites increased compared with 4 weeks ago.  Small bilateral pleural effusions.  Postsurgical changes C/W SB resection.  No intestinal obstruction. After this, Eliquis discontinued.    Admitted 07/31/2020 with recurrent SBO, intra-abdominal fluid collection.  Recurrent anemia, Hgb 5.2. N/V.  s/p PRBCs.    08/02/2020 EGD.   HH.  Nonobstructing Schatzki's ring.  Grade a esophagitis, no associated bleeding.  APC treatment of multiple bleeding gastric AVMs.  Retained Endo Clip in stomach.  Normal examined duodenum. 08/06/2020 IR placed drain into pelvic fluid collection. Got # PRBCs on 2/13-2/14.   Now w recurrent (vs ongoing melena) and dropping Hgb.  Getting 4th PRBC. Denies n/v, dizziness, SOB.  + abd pain from abscess, wound.   Was made n.p.o. and missed last night's dinner and breakfast this morning so he is hungry.    Compliant w Protonix 40 mg po bid. Carafate not in use due to ESRD.  On Ferric citrate.     OBJECTIVE:         Vital signs in last 24 hours:    Temp:  [97.1 F (36.2 C)-98.1 F (36.7 C)] 97.5 F (36.4 C) (02/22 0756) Pulse Rate:  [73-99] 84 (02/22 0756) Resp:  [14-18] 14 (02/22 0756) BP: (91-109)/(64-72) 107/70 (02/22 0756) SpO2:  [95 %-100 %] 100 % (02/22 0727) Weight:  [51.3 kg] 51.3 kg (02/21 1050) Last BM Date : 08/08/21 Filed Weights   08/08/21 0516 08/08/21 0732 08/08/21 1050  Weight: 56.5 kg 52.4 kg 51.3 kg   General: Pleasant, comfortable.  Does not look toxic or septic. Heart: RRR. Chest: No labored breathing or cough Abdomen: Soft.  Nontender.  Scabbed over area of visible midline wound, bandage not removed to look at entire wound.  IR drain in place with some serous/purulent output.  No blood seen  in JP drain. Extremities: No CCE. Neuro/Psych: Does not, cooperative.  Fluid speech.  Intake/Output from previous day: 02/21 0701 - 02/22 0700 In: 605 [P.O.:600] Out: 1020 [Drains:20]  Intake/Output this shift: No intake/output data recorded.  Lab Results: Recent Labs    08/07/21 0435 08/08/21 0123 08/09/21 0213  WBC 8.9 6.9 6.0  HGB 8.1* 7.8* 6.9*  HCT 23.1* 22.2* 20.6*  PLT 169 154 144*   BMET Recent Labs    08/07/21 0435 08/08/21 0812  NA 137 136  K 4.3 3.1*  CL 99 98  CO2 25 26  GLUCOSE 75 70  BUN 24* 29*  CREATININE 3.63* 4.80*  CALCIUM 8.8* 8.7*   LFT Recent Labs    08/08/21 0812  ALBUMIN 1.6*   PT/INR No results for input(s): LABPROT, INR in the last 72 hours. Hepatitis Panel No results for input(s): HEPBSAG, HCVAB, HEPAIGM, HEPBIGM in the last 72 hours.  Studies/Results: No results found.  ASSESMENT:   RECURRENT upper GI bleed in patient with hx multiple GI bleeds due to gastric AVMs.  Had APC ablation of multiple AVMs as recently as 1 week ago.  Compliant with bid Protonix.  Switched from oral to IV formulation yesterday  Blood loss anemia and anemia from sickle cell disease and ESRD.  On Aranesp every Saturday, oral ferric citrate.      SBO.  Intra-abdominal fluid collection/abscess growing E. coli.  Status post IR drainage.  ESRD.  HD days are TTS.  PLAN   Plan EGD tomorrow.  Today's endoscopy schedule will not allow him to go until after 5 PM so best to defer procedure till tomorrow.  Follow-up CBC after transfusion, give more blood if necessary.  Not sure IV Protonix will confer any more benefit than PO Protonix but will leave the IV formulation in place for now.    Azucena Freed  08/09/2021, 8:56 AM Phone [phone]     Attending physician's note   I have taken history, reviewed the chart and examined the patient. I performed a substantive portion of this encounter, including complete performance of at least one of the key  components, in conjunction with the APP. I agree with the Advanced Practitioner's note, impression and recommendations.   Known to GI service from recent consult Very complicated patient.  See details above  Pt with recurrent UGI bleed d/t gastric AVMs s/p ablation a week ago with recurrent bleeding HD stable. Hb 8 to 6.3 with recurrent melena  Reasonable to have another EGD (to determine if any lesions would require retreatment) Trend CBC Continue IV Protonix Discussed risks and benefits of EGD with patient in detail.  He agrees to proceed.    Carmell Austria, MD [facility] [phone]

## 2021-08-10 ENCOUNTER — Inpatient Hospital Stay (HOSPITAL_COMMUNITY): Payer: HMO | Admitting: Anesthesiology

## 2021-08-10 ENCOUNTER — Encounter (HOSPITAL_COMMUNITY)
Admission: EM | Disposition: A | Discharge status: Home Health | Payer: Self-pay | Source: Home / Self Care | Attending: Internal Medicine

## 2021-08-10 ENCOUNTER — Encounter (HOSPITAL_COMMUNITY): Payer: Self-pay | Admitting: Family Medicine

## 2021-08-10 DIAGNOSIS — K449 Diaphragmatic hernia without obstruction or gangrene: Secondary | ICD-10-CM

## 2021-08-10 DIAGNOSIS — K31811 Angiodysplasia of stomach and duodenum with bleeding: Secondary | ICD-10-CM

## 2021-08-10 DIAGNOSIS — I509 Heart failure, unspecified: Secondary | ICD-10-CM

## 2021-08-10 DIAGNOSIS — I110 Hypertensive heart disease with heart failure: Secondary | ICD-10-CM

## 2021-08-10 DIAGNOSIS — K921 Melena: Secondary | ICD-10-CM

## 2021-08-10 HISTORY — PX: HOT HEMOSTASIS: SHX5433

## 2021-08-10 HISTORY — PX: ESOPHAGOGASTRODUODENOSCOPY (EGD) WITH PROPOFOL: SHX5813

## 2021-08-10 LAB — BASIC METABOLIC PANEL
Anion gap: 10 (ref 5–15)
BUN: 18 mg/dL (ref 8–23)
CO2: 26 mmol/L (ref 22–32)
Calcium: 8.3 mg/dL — ABNORMAL LOW (ref 8.9–10.3)
Chloride: 96 mmol/L — ABNORMAL LOW (ref 98–111)
Creatinine, Ser: 4.24 mg/dL — ABNORMAL HIGH (ref 0.61–1.24)
GFR, Estimated: 15 mL/min — ABNORMAL LOW (ref >60)
Glucose, Bld: 101 mg/dL — ABNORMAL HIGH (ref 70–99)
Potassium: 3.2 mmol/L — ABNORMAL LOW (ref 3.5–5.1)
Sodium: 132 mmol/L — ABNORMAL LOW (ref 135–145)

## 2021-08-10 LAB — RENAL FUNCTION PANEL
Albumin: 1.6 g/dL — ABNORMAL LOW (ref 3.5–5.0)
Anion gap: 10 (ref 5–15)
BUN: 20 mg/dL (ref 8–23)
CO2: 26 mmol/L (ref 22–32)
Calcium: 8.3 mg/dL — ABNORMAL LOW (ref 8.9–10.3)
Chloride: 96 mmol/L — ABNORMAL LOW (ref 98–111)
Creatinine, Ser: 4.17 mg/dL — ABNORMAL HIGH (ref 0.61–1.24)
GFR, Estimated: 15 mL/min — ABNORMAL LOW (ref >60)
Glucose, Bld: 98 mg/dL (ref 70–99)
Phosphorus: 2.5 mg/dL (ref 2.5–4.6)
Potassium: 3.3 mmol/L — ABNORMAL LOW (ref 3.5–5.1)
Sodium: 132 mmol/L — ABNORMAL LOW (ref 135–145)

## 2021-08-10 LAB — TYPE AND SCREEN
ABO/RH(D): AB POS
Antibody Screen: NEGATIVE
Unit division: 0

## 2021-08-10 LAB — BPAM RBC
Blood Product Expiration Date: 202302232359
ISSUE DATE / TIME: 202302220733
Unit Type and Rh: 600

## 2021-08-10 LAB — CBC
HCT: 21.7 % — ABNORMAL LOW (ref 39.0–52.0)
Hemoglobin: 7.7 g/dL — ABNORMAL LOW (ref 13.0–17.0)
MCH: 33.9 pg (ref 26.0–34.0)
MCHC: 35.5 g/dL (ref 30.0–36.0)
MCV: 95.6 fL (ref 80.0–100.0)
Platelets: 146 10*3/uL — ABNORMAL LOW (ref 150–400)
RBC: 2.27 MIL/uL — ABNORMAL LOW (ref 4.22–5.81)
RDW: 21.1 % — ABNORMAL HIGH (ref 11.5–15.5)
WBC: 4.9 10*3/uL (ref 4.0–10.5)
nRBC: 4.3 % — ABNORMAL HIGH (ref 0.0–0.2)

## 2021-08-10 SURGERY — ESOPHAGOGASTRODUODENOSCOPY (EGD) WITH PROPOFOL
Anesthesia: General

## 2021-08-10 MED ORDER — PROPOFOL 10 MG/ML IV BOLUS
INTRAVENOUS | Status: DC | PRN
  Administered 2021-08-10: 20 mg via INTRAVENOUS

## 2021-08-10 MED ORDER — POTASSIUM CHLORIDE CRYS ER 20 MEQ PO TBCR
40.0000 meq | EXTENDED_RELEASE_TABLET | Freq: Once | ORAL | Status: DC
  Filled 2021-08-10: qty 2

## 2021-08-10 MED ORDER — METOPROLOL TARTRATE 5 MG/5ML IV SOLN
5.0000 mg | INTRAVENOUS | Status: DC | PRN
Start: 2021-08-10 — End: 2021-08-12
  Administered 2021-08-10: 5 mg via INTRAVENOUS
  Filled 2021-08-10: qty 5

## 2021-08-10 MED ORDER — LIDOCAINE 2% (20 MG/ML) 5 ML SYRINGE
INTRAMUSCULAR | Status: DC | PRN
  Administered 2021-08-10: 20 mg via INTRAVENOUS

## 2021-08-10 MED ORDER — PROPOFOL 500 MG/50ML IV EMUL
INTRAVENOUS | Status: DC | PRN
  Administered 2021-08-10: 100 ug/kg/min via INTRAVENOUS

## 2021-08-10 MED ORDER — METOPROLOL TARTRATE 12.5 MG HALF TABLET
12.5000 mg | ORAL_TABLET | Freq: Two times a day (BID) | ORAL | Status: DC
  Administered 2021-08-11: 12.5 mg via ORAL
  Filled 2021-08-10 (×2): qty 1

## 2021-08-10 MED ORDER — SODIUM CHLORIDE 0.9 % IV SOLN: INTRAVENOUS | Status: DC | PRN

## 2021-08-10 SURGICAL SUPPLY — 15 items

## 2021-08-10 NOTE — Progress Notes (Signed)
Referring Physician(s): Wilmer Floor PA  Supervising Physician: Corrie Mckusick  Patient Status:  [facility] - In-pt  Chief Complaint:  Pelvic abscess s/p LLQ pelvic abscess drain placed on 2.19.23 by Dr. Anselm Pancoast  Subjective:  Patient laying in bed. Patient states that he has just come back from dialysis and his stomach and back hurt. Patient requesting pain medication. RN made aware  Allergies: Patient has no known allergies.  Medications: Prior to Admission medications   Medication Sig Start Date End Date Taking? Authorizing Provider  Acetaminophen (TYLENOL 8 HOUR PO) Patient takes two 500 mg tablets as needed for pain.   Yes [provider]  albuterol (VENTOLIN HFA) 108 (90 Base) MCG/ACT inhaler Inhale 2 puffs into the lungs every 6 (six) hours as needed for wheezing or shortness of breath. 05/09/20  Yes Freddi Starr, MD  allopurinol (ZYLOPRIM) 100 MG tablet TAKE 1 TABLET BY MOUTH EVERY DAY Patient taking differently: Take 100 mg by mouth daily. 07/13/21  Yes Dorena Dew, FNP  AURYXIA 1 GM 210 MG(Fe) tablet Take 420 mg by mouth 3 (three) times daily. 02/11/21  Yes [provider]  folic acid (FOLVITE) 1 MG tablet TAKE 1 TABLET BY MOUTH EVERY DAY Patient taking differently: Take 1 mg by mouth daily. 02/22/21  Yes Dorena Dew, FNP  Glycerin-Hypromellose-PEG 400 (VISINE DRY EYE OP) Place 1 drop into both eyes as needed (dryness).   Yes [provider]  hydroxyurea (HYDREA) 500 MG capsule Take 1 capsule (500 mg total) by mouth daily. TAKE 1 CAPSULE BY MOUTH EVERY DAY WITH FOOD TO MINIMIZE GI SIDE EFFECTS Patient taking differently: Take 500 mg by mouth daily before breakfast. 01/31/21  Yes Dorena Dew, FNP  lidocaine-prilocaine (EMLA) cream Apply 1 application topically daily as needed (port access).  09/08/19  Yes [provider]  oxycodone (OXY-IR) 5 MG capsule Take 2 capsules (10 mg total) by mouth every 6 (six) hours as needed for pain.  07/31/21  Yes Dorena Dew, FNP  pantoprazole (PROTONIX) 40 MG tablet TAKE 1 TABLET BY MOUTH TWICE A DAY Patient taking differently: Take 40 mg by mouth 2 (two) times daily. 07/26/21  Yes Willia Craze, NP  tiZANidine (ZANAFLEX) 4 MG tablet Take 0.5 tablets (2 mg total) by mouth every 8 (eight) hours as needed for muscle spasms. 07/18/21  Yes Dorena Dew, FNP  torsemide (DEMADEX) 20 MG tablet Take 20 mg by mouth in the morning, at noon, in the evening, and at bedtime.   Yes [provider]  Vitamin D, Ergocalciferol, (DRISDOL) 1.25 MG (50000 UNIT) CAPS capsule TAKE 1 CAPSULE BY MOUTH ONE TIME PER WEEK Patient taking differently: Take 50,000 Units by mouth once a week. Monday 05/15/21  Yes Dorena Dew, FNP  carvedilol (COREG) 3.125 MG tablet Take 1 tablet (3.125 mg total) by mouth 2 (two) times daily. Patient not taking: Reported on 07/31/2021 07/17/21 10/15/21  Rafael Bihari, FNP  gabapentin (NEURONTIN) 100 MG capsule TAKE 1 CAPSULE BY MOUTH EVERYDAY AT BEDTIME Patient not taking: Reported on 07/31/2021 12/12/20   Dorena Dew, FNP     Vital Signs: BP 105/67    Pulse 78    Temp (!) 97.5 F (36.4 C)    Resp 11    Ht 5\' 11"  (1.803 m)    Wt 115 lb 15.4 oz (52.6 kg)    SpO2 94%    BMI 16.17 kg/m   Physical Exam Vitals and nursing note reviewed.  Constitutional:      Appearance: He is well-developed.  HENT:     Head: Normocephalic.  Pulmonary:     Effort: Pulmonary effort is normal.  Abdominal:     Comments: Pelvic abscess s/p LLQ pelvic abscess drain placed on 2.19.23 by Dr. Anselm Pancoast. Positive LLQ  drain to suction. Site is unremarkable with no erythema, edema, tenderness, bleeding or drainage noted at exit site. Dressing is clean dry and intact. Minimal amount of  yellow - clear colored fluid noted in bulb suction device. Drain is able to be flushed easily.    Musculoskeletal:        General: Normal range of motion.     Cervical back: Normal range of motion.   Skin:    General: Skin is dry.  Neurological:     Mental Status: He is alert and oriented to person, place, and time.    Imaging: DG Abd Portable 1V  Result Date: 08/07/2021 CLINICAL DATA:  Small bowel obstruction. EXAM: PORTABLE ABDOMEN - 1 VIEW COMPARISON:  08/04/2021 and CT abdomen 08/04/2021. FINDINGS: Nasogastric tube terminates in the stomach with the side port a few cm beyond the gastroesophageal junction. Placement of a percutaneous drainage catheter in the left anatomic pelvis. Gas is seen in nondilated small bowel and colon. Surgical clips in both upper quadrants. IMPRESSION: 1. Unremarkable bowel gas pattern. 2. Placement of a percutaneous drainage catheter in the left anatomic pelvis. Electronically Signed   By: Lorin Picket M.D.   On: 08/07/2021 08:14    Labs:  CBC: Recent Labs    08/07/21 0435 08/08/21 0123 08/09/21 0213 08/10/21 0344  WBC 8.9 6.9 6.0 4.9  HGB 8.1* 7.8* 6.9* 7.7*  HCT 23.1* 22.2* 20.6* 21.7*  PLT 169 154 144* 146*    COAGS: Recent Labs    05/29/21 0113 05/29/21 1327 06/27/21 0830 08/06/21 0219  INR 1.3* 1.3* 1.9* 1.4*  APTT  --  33  --   --     BMP: Recent Labs    08/07/21 0435 08/08/21 0812 08/10/21 0344 08/10/21 1034  NA 137 136 132* 132*  K 4.3 3.1* 3.2* 3.3*  CL 99 98 96* 96*  CO2 25 26 26 26   GLUCOSE 75 70 101* 98  BUN 24* 29* 18 20  CALCIUM 8.8* 8.7* 8.3* 8.3*  CREATININE 3.63* 4.80* 4.24* 4.17*  GFRNONAA 18* 13* 15* 15*    LIVER FUNCTION TESTS: Recent Labs    06/05/21 0350 06/06/21 0719 06/27/21 0830 07/31/21 1611 08/01/21 0758 08/04/21 0241 08/05/21 0345 08/08/21 0812 08/10/21 1034  BILITOT 1.5*  --  3.0* 1.4*  --  1.5*  --   --   --   AST 20  --  23 14*  --  10*  --   --   --   ALT 16  --  14 10  --  9  --   --   --   ALKPHOS 263*  --  563* 237*  --  180*  --   --   --   PROT 5.4*  --  6.1* 6.5  --  5.9*  --   --   --   ALBUMIN 1.7*   < > <1.5* <1.5*   < > 1.5*   1.5* 1.6* 1.6* 1.6*   < > = values in  this interval not displayed.    Assessment and Plan:  66 y.o. male inpatient. History of CHF, ESRD on HD, sickle cell. Found to have a cute gangrenous cholecystitis s/p  ex lap, small bowel resection., cholecystectomy on 12.7.22. Presented to the ED on 2.13.23 for anemia. Hospital stay complicated bu post op pelvic fluid abscess. IR placed a pelvic abscess drain on 2.19.23. General surgery following. Cultures grew e.coli, abundant bacteroides fragilis, beat lactamase positive. WBC 4.9. Patient is afebrile  Drain Location: LLQ Size: Fr size: 10 Fr Date of placement: 2.19.23  Currently to: Drain collection device: suction bulb 24 hour output:  Output by Drain (mL) 08/08/21 0701 - 08/08/21 1900 08/08/21 1901 - 08/09/21 0700 08/09/21 0701 - 08/09/21 1900 08/09/21 1901 - 08/10/21 0700 08/10/21 0701 - 08/10/21 1652  Closed System Drain 1 Groin Bulb (JP) 10 Fr. 15 5  20    Minimal amount of clear- serous fluid noted to be in the JP drain.   Interval imaging/drain manipulation:  Abdominal xray from 2.20.23  Current examination: Flushes/aspirates easily.  Insertion site unremarkable. Suture and stat lock in place. Dressed appropriately.   Plan: Continue TID flushes with 5 cc NS. Record output Q shift. Dressing changes QD or PRN if soiled.  Call IR APP or on call IR MD if difficulty flushing or sudden change in drain output.  Repeat imaging/possible drain injection once output < 10 mL/QD (excluding flush material.)  Discharge planning: Please contact IR APP or on call IR MD prior to patient d/c to ensure appropriate follow up plans are in place. Typically patient will follow up with IR clinic 10-14 days post d/c for repeat imaging/possible drain injection. IR scheduler will contact patient with date/time of appointment. Patient will need to flush drain QD with 5 cc NS, record output QD, dressing changes every 2-3 days or earlier if soiled.   IR will continue to follow - please call with  questions or concerns.  Electronically Signed: Jacqualine Mau, NP 08/10/2021, 4:51 PM   I spent a total of 15 Minutes at the patient's bedside AND on the patient's hospital floor or unit, greater than 50% of which was counseling/coordinating care for LLQ pelvic abscess drain

## 2021-08-10 NOTE — Progress Notes (Addendum)
PROGRESS NOTE        PATIENT DETAILS Name: Patrick [name] Age: 66 y.o. Sex: male Date of Birth: 27-Jul-1955 Admit Date: 07/31/2021 Admitting Physician Eben Burow, MD EHU:[id number], Asencion Partridge, FNP  Brief Summary: 66 year old male with history of ESRD on HD, sickle cell disease, permanent atrial fibrillation, history of UGI due to AVMs, recent history (December 2022) of choledocholithiasis and ischemic small bowel requiring cholecystectomy/biliary stent placement/bowel resection-referred to the ED from HD center for hemoglobin of 5.8-subsequent EGD on 2/15 showed bleeding AVMs.  Hospital course complicated by SBO with pelvic abscess, recurrent upper GI bleeding on 2/22 requiring PRBC transfusion-and a repeat EGD on 2/23.    Significant events: 2/13>> referred to the ED from HD center for evaluation of hemoglobin of 5.8. 2/15>> EGD: Bleeding AVM-s/p APC 2/17>> SBO w pelvic abscess on CT abdomen/pelvis -NGT placed-CCS consulted 2/19>> percutaneous pelvic drain placed by IR 2/21>>NGT removed 2/22>> redeveloped melena-hemoglobin down to 6.9-PRBC transfusion 2/23>> EGD: Nonbleeding AVM-s/p APC  Significant imaging studies: 2/17>> MRI LS spine: Limited exams-no acute abnormalities. 2/17>> CT abd/pelvis: Questionable SBO and abcess  Significant microbiology data: 2/13>> COVID/influenza PCR: Negative 2/19>> pelvic abscess culture: E. coli/Bacteroides  Procedures: 2/15>> EGD: Multiple bleeding angiectasia's in the stomach treated with argon plasma coagulation 2/19>> Pelvic fluid drain placed, 10 [place] 2/23>> EGD: Few small AVMs with no active bleeding s/p treatment with argon plasma coagulation.  Consults: GI, nephrology, Gen Sx  Subjective: No further melanotic stools overnight.  Tolerated diet well yesterday without any vomiting.  Objective: Vitals: Blood pressure 119/70, pulse 89, temperature (!) 97.5 F (36.4 C), resp. rate 15, height 5\' 11"   (1.803 m), weight 51.3 kg, SpO2 94 %.   Exam: Gen Exam:Alert awake-not in any distress HEENT:atraumatic, normocephalic Chest: B/L clear to auscultation anteriorly CVS:S1S2 regular Abdomen:soft non tender, non distended Extremities:no edema Neurology: Non focal Skin: no rash     Pertinent Labs/Radiology: CBC Latest Ref Rng & Units 08/10/2021 08/09/2021 08/08/2021  WBC 4.0 - 10.5 K/uL 4.9 6.0 6.9  Hemoglobin 13.0 - 17.0 g/dL 7.7(L) 6.9(LL) 7.8(L)  Hematocrit 39.0 - 52.0 % 21.7(L) 20.6(L) 22.2(L)  Platelets 150 - 400 K/uL 146(L) 144(L) 154    Lab Results  Component Value Date   NA 132 (L) 08/10/2021   K 3.3 (L) 08/10/2021   CL 96 (L) 08/10/2021   CO2 26 08/10/2021      Assessment/Plan: SBO with pelvic abscess: Managed conservatively-with NG tube/n.p.o. status-clinically improved after draining pelvic abscess.  NG tube has been removed-has tolerated advancement in diet.  Pelvic drain remains in place-abscess cultures positive for E. coli/Bacteroides-has been switched from Zosyn to Rocephin and Flagyl.   Upper GI bleeding with acute blood loss anemia due to AVMs: No further melanotic stools overnight-repeat EGD 2/23 showed nonbleeding AVMs-s/p APC.  Hemoglobin stable at 7.7.  Had a prior EGD during the earlier part of the hospital stay on 2/15 that showed bleeding AVMs.  Remains on PPI twice daily.  Follow CBC closely.  Recent history of choledocholithiasis-s/p ERCP with pancreatic/biliary stents (removed) followed by open cholecystectomy and resection of ischemic small bowel on 05/24/2021  ESRD: On HD per nephrology.  Chronic HFrEF (EF 40-45% by echo December 2022): Volume status stable-volume removal with HD.  Permanent atrial fibrillation: Rate controlled with Coreg-not a candidate for anticoagulation due to recurrent GI bleeding.  HTN: BP stable-not on  any antihypertensives.  Sickle cell disease/Lumbago: Stable-MRI of LS spine without any acute abnormalities although a poor  study.  Continue supportive care.  Benign neurological exam.  Legally blind  BMI: Estimated body mass index is 15.77 kg/m as calculated from the following:   Height as of this encounter: 5\' 11"  (1.803 m).   Weight as of this encounter: 51.3 kg.   Code status:   Code Status: Full Code   DVT Prophylaxis: SCDs Start: 07/31/21 2024   Family Communication: None at bedside  Disposition Plan: Status is: Inpatient Remains inpatient appropriate because: Recurrent upper GI bleeding with acute blood loss anemia requiring transfusion on 2/22-likely needs another few more days of hospitalization before consideration of discharge.  Planned Discharge Destination:Home   Diet: Diet Order             Diet full liquid Room service appropriate? Yes; Fluid consistency: Thin  Diet effective now                   Antimicrobial agents: Anti-infectives (From admission, onward)    Start     Dose/Rate Route Frequency Ordered Stop   08/09/21 1600  metroNIDAZOLE (FLAGYL) IVPB 500 mg        500 mg 100 mL/hr over 60 Minutes Intravenous Every 12 hours 08/09/21 1418     08/09/21 1500  cefTRIAXone (ROCEPHIN) 2 g in sodium chloride 0.9 % 100 mL IVPB        2 g 200 mL/hr over 30 Minutes Intravenous Every 24 hours 08/09/21 1418     08/05/21 2200  piperacillin-tazobactam (ZOSYN) IVPB 2.25 g  Status:  Discontinued        2.25 g 100 mL/hr over 30 Minutes Intravenous Every 8 hours 08/05/21 1455 08/09/21 1418   08/04/21 2200  piperacillin-tazobactam (ZOSYN) IVPB 3.375 g  Status:  Discontinued        3.375 g 12.5 mL/hr over 240 Minutes Intravenous Every 8 hours 08/04/21 1927 08/05/21 1455        MEDICATIONS: Scheduled Meds:  (feeding supplement) PROSource Plus  30 mL Oral BID BM   allopurinol  100 mg Oral Daily   Chlorhexidine Gluconate Cloth  6 each Topical Q0600   darbepoetin (ARANESP) injection - DIALYSIS  200 mcg Intravenous Q Sat-HD   ferric citrate  420 mg Oral TID with meals   folic acid   1 mg Oral Daily   hydroxyurea  500 mg Oral Daily   pantoprazole (PROTONIX) IV  40 mg Intravenous Q12H   polyethylene glycol  17 g Oral Daily   potassium chloride  40 mEq Oral Once   sodium chloride flush  3 mL Intravenous Q12H   sodium chloride flush  5 mL Intracatheter Q8H   Continuous Infusions:  sodium chloride     sodium chloride     sodium chloride     sodium chloride     sodium chloride     cefTRIAXone (ROCEPHIN)  IV 2 g (08/09/21 1452)   metronidazole 500 mg (08/10/21 0322)   promethazine (PHENERGAN) injection (IM or IVPB) Stopped (08/02/21 0135)   PRN Meds:.sodium chloride, sodium chloride, sodium chloride, sodium chloride, acetaminophen **OR** acetaminophen, alteplase, alum & mag hydroxide-simeth, heparin, heparin, HYDROmorphone (DILAUDID) injection, lidocaine (PF), lidocaine (PF), lidocaine-prilocaine, lidocaine-prilocaine, ondansetron **OR** ondansetron (ZOFRAN) IV, oxyCODONE, pentafluoroprop-tetrafluoroeth, pentafluoroprop-tetrafluoroeth, promethazine (PHENERGAN) injection (IM or IVPB), sodium chloride flush   I have personally reviewed following labs and imaging studies  LABORATORY DATA: CBC: Recent Labs  Lab 08/04/21 0241 08/04/21 1649 08/06/21 5427  08/07/21 0435 08/08/21 0123 08/09/21 0213 08/10/21 0344  WBC 9.6   < > 9.4 8.9 6.9 6.0 4.9  NEUTROABS 7.6  --   --   --   --   --   --   HGB 7.8*   < > 7.9* 8.1* 7.8* 6.9* 7.7*  HCT 22.1*   < > 23.1* 23.1* 22.2* 20.6* 21.7*  MCV 95.7   < > 99.1 98.7 98.2 98.1 95.6  PLT 208   < > 193 169 154 144* 146*   < > = values in this interval not displayed.     Basic Metabolic Panel: Recent Labs  Lab 08/04/21 0241 08/05/21 0345 08/07/21 0435 08/08/21 0812 08/10/21 0344 08/10/21 1034  NA 136 134* 137 136 132* 132*  K 3.1* 3.8 4.3 3.1* 3.2* 3.3*  CL 99 98 99 98 96* 96*  CO2 27 21* 25 26 26 26   GLUCOSE 79 75 75 70 101* 98  BUN 22 34* 24* 29* 18 20  CREATININE 2.72* 3.73* 3.63* 4.80* 4.24* 4.17*  CALCIUM 8.7*  8.8* 8.8* 8.7* 8.3* 8.3*  PHOS 3.3 3.9  --  4.3  --  2.5     GFR: Estimated Creatinine Clearance: 13.2 mL/min (A) (by C-G formula based on SCr of 4.17 mg/dL (H)).  Liver Function Tests: Recent Labs  Lab 08/04/21 0241 08/05/21 0345 08/08/21 0812 08/10/21 1034  AST 10*  --   --   --   ALT 9  --   --   --   ALKPHOS 180*  --   --   --   BILITOT 1.5*  --   --   --   PROT 5.9*  --   --   --   ALBUMIN 1.5*   1.5* 1.6* 1.6* 1.6*    No results for input(s): LIPASE, AMYLASE in the last 168 hours. No results for input(s): AMMONIA in the last 168 hours.  Coagulation Profile: Recent Labs  Lab 08/06/21 0219  INR 1.4*     Cardiac Enzymes: No results for input(s): CKTOTAL, CKMB, CKMBINDEX, TROPONINI in the last 168 hours.  BNP (last 3 results) No results for input(s): PROBNP in the last 8760 hours.  Lipid Profile: No results for input(s): CHOL, HDL, LDLCALC, TRIG, CHOLHDL, LDLDIRECT in the last 72 hours.  Thyroid Function Tests: No results for input(s): TSH, T4TOTAL, FREET4, T3FREE, THYROIDAB in the last 72 hours.  Anemia Panel: No results for input(s): VITAMINB12, FOLATE, FERRITIN, TIBC, IRON, RETICCTPCT in the last 72 hours.  Urine analysis:    Component Value Date/Time   COLORURINE STRAW (A) 06/02/2018 1900   APPEARANCEUR CLEAR 06/02/2018 1900   APPEARANCEUR Clear 05/31/2017 0902   LABSPEC 1.006 06/02/2018 1900   PHURINE 7.0 06/02/2018 1900   GLUCOSEU NEGATIVE 06/02/2018 1900   HGBUR SMALL (A) 06/02/2018 1900   BILIRUBINUR neg 01/26/2020 1124   BILIRUBINUR Negative 05/31/2017 0902   KETONESUR negative 09/08/2018 1621   KETONESUR NEGATIVE 06/02/2018 1900   PROTEINUR Positive (A) 01/26/2020 1124   PROTEINUR 30 (A) 06/02/2018 1900   UROBILINOGEN 1.0 01/26/2020 1124   UROBILINOGEN 0.2 09/14/2013 1354   NITRITE neg 01/26/2020 1124   NITRITE NEGATIVE 06/02/2018 1900   LEUKOCYTESUR Negative 01/26/2020 1124   LEUKOCYTESUR Negative 05/31/2017 0902    Sepsis  Labs: Lactic Acid, Venous    Component Value Date/Time   LATICACIDVEN 1.3 06/27/2021 0830    MICROBIOLOGY: Recent Results (from the past 240 hour(s))  MRSA Next Gen by PCR, Nasal  Status: None   Collection Time: 08/01/21  5:54 AM   Specimen: Nasal Mucosa; Nasal Swab  Result Value Ref Range Status   MRSA by PCR Next Gen NOT DETECTED NOT DETECTED Final    Comment: (NOTE) The GeneXpert MRSA Assay (FDA approved for NASAL specimens only), is one component of a comprehensive MRSA colonization surveillance program. It is not intended to diagnose MRSA infection nor to guide or monitor treatment for MRSA infections. Test performance is not FDA approved in patients less than 68 years old. Performed at [facility], [place] [address] [place], [place] [address]   Aerobic/Anaerobic Culture w Gram Stain (surgical/deep wound)     Status: None   Collection Time: 08/06/21 11:10 AM   Specimen: Abscess  Result Value Ref Range Status   Specimen Description ABSCESS  Final   Special Requests NONE  Final   Gram Stain   Final    ABUNDANT WBC PRESENT, PREDOMINANTLY PMN ABUNDANT GRAM NEGATIVE RODS RARE GRAM POSITIVE COCCI    Culture   Final    FEW ESCHERICHIA COLI ABUNDANT BACTEROIDES FRAGILIS BETA LACTAMASE POSITIVE Performed at [facility], [place] [address] [place], [place] [address]    Report Status 08/09/2021 FINAL  Final   Organism ID, Bacteria ESCHERICHIA COLI  Final      Susceptibility   Escherichia coli - MIC*    AMPICILLIN <=2 SENSITIVE Sensitive     CEFAZOLIN <=4 SENSITIVE Sensitive     CEFEPIME <=0.12 SENSITIVE Sensitive     CEFTAZIDIME <=1 SENSITIVE Sensitive     CEFTRIAXONE <=0.25 SENSITIVE Sensitive     CIPROFLOXACIN <=0.25 SENSITIVE Sensitive     GENTAMICIN <=1 SENSITIVE Sensitive     IMIPENEM <=0.25 SENSITIVE Sensitive     TRIMETH/SULFA <=20 SENSITIVE Sensitive     AMPICILLIN/SULBACTAM <=2 SENSITIVE Sensitive     PIP/TAZO <=4 SENSITIVE Sensitive     * FEW  ESCHERICHIA COLI    RADIOLOGY STUDIES/RESULTS: No results found.   LOS: 10 days   Oren Binet, DO  [facility]  To contact the attending provider between 7A-7P or the covering provider during after hours 7P-7A, please log into the web site [email or web address] and access using universal [place] password for that web site. If you do not have the password, please call the hospital operator.  08/10/2021, 12:01 PM

## 2021-08-10 NOTE — Transfer of Care (Signed)
Immediate Anesthesia Transfer of Care Note  Patient: Patrick [name]  Procedure(s) Performed: ESOPHAGOGASTRODUODENOSCOPY (EGD) WITH PROPOFOL HOT HEMOSTASIS (ARGON PLASMA COAGULATION/BICAP)  Patient Location: PACU  Anesthesia Type:MAC  Level of Consciousness: awake, alert  and oriented  Airway & Oxygen Therapy: Patient Spontanous Breathing and Patient connected to nasal cannula oxygen  Post-op Assessment: Report given to RN, Post -op Vital signs reviewed and stable and Patient moving all extremities X 4  Post vital signs: Reviewed and stable  Last Vitals:  Vitals Value Taken Time  BP 119/70 08/10/21 0940  Temp    Pulse 102 08/10/21 0941  Resp 21 08/10/21 0941  SpO2 100 % 08/10/21 0941  Vitals shown include unvalidated device data.  Last Pain:  Vitals:   08/10/21 0830  TempSrc: Oral  PainSc: 0-No pain      Patients Stated Pain Goal: 4 (36/62/94 7654)  Complications: No notable events documented.

## 2021-08-10 NOTE — Interval H&P Note (Signed)
History and Physical Interval Note:  08/10/2021 9:21 AM  Patrick [name]  has presented today for surgery, with the diagnosis of Recurrent GI bleed and blood loss anemia in patient with recurrent bleeding from gastric AVMs..  The various methods of treatment have been discussed with the patient and family. After consideration of risks, benefits and other options for treatment, the patient has consented to  Procedure(s): ESOPHAGOGASTRODUODENOSCOPY (EGD) WITH PROPOFOL (N/A) as a surgical intervention.  The patient's history has been reviewed, patient examined, no change in status, stable for surgery.  I have reviewed the patient's chart and labs.  Questions were answered to the patient's satisfaction.     Patrick [name]

## 2021-08-10 NOTE — Anesthesia Preprocedure Evaluation (Addendum)
Anesthesia Evaluation  Patient identified by MRN, date of birth, ID band Patient awake    Reviewed: Allergy & Precautions, NPO status , Patient's Chart, lab work & pertinent test results  Airway Mallampati: II  TM Distance: >3 FB     Dental   Pulmonary shortness of breath, COPD, former smoker,    breath sounds clear to auscultation       Cardiovascular hypertension, +CHF   Rhythm:Regular Rate:Normal     Neuro/Psych  Headaches,    GI/Hepatic hiatal hernia,   Endo/Other    Renal/GU Renal disease     Musculoskeletal   Abdominal   Peds  Hematology   Anesthesia Other Findings   Reproductive/Obstetrics                             Anesthesia Physical Anesthesia Plan  ASA: 3  Anesthesia Plan: General   Post-op Pain Management:    Induction: Intravenous  PONV Risk Score and Plan: 2 and Ondansetron and Propofol infusion  Airway Management Planned: Simple Face Mask  Additional Equipment:   Intra-op Plan:   Post-operative Plan:   Informed Consent: I have reviewed the patients History and Physical, chart, labs and discussed the procedure including the risks, benefits and alternatives for the proposed anesthesia with the patient or authorized representative who has indicated his/her understanding and acceptance.     Dental advisory given  Plan Discussed with: CRNA and Anesthesiologist  Anesthesia Plan Comments:         Anesthesia Quick Evaluation

## 2021-08-10 NOTE — Progress Notes (Signed)
Day of Surgery  Subjective: Patient feels great. Tolerating soft diet with no issues.  Had a BM overnight and reports from nursing staff with no blood or melena.  ROS: See above, otherwise other systems negative  Objective: Vital signs in last 24 hours: Temp:  [97.3 F (36.3 C)-97.9 F (36.6 C)] 97.7 F (36.5 C) (02/23 0332) Pulse Rate:  [81-100] 81 (02/23 0332) Resp:  [15-20] 16 (02/23 0332) BP: (90-112)/(65-79) 90/67 (02/23 0332) SpO2:  [94 %-100 %] 98 % (02/23 0332) Last BM Date : 08/09/21  Intake/Output from previous day: 02/22 0701 - 02/23 0700 In: 696 [P.O.:240; Blood:227.1; IV Piggyback:223.9] Out: 20 [Drains:20] Intake/Output this shift: No intake/output data recorded.  PE: Gen: NAD, laying in bed Abd: soft, NT, ND, midline wound almost completely healed. IR drain in place with no output currently.  20cc/24hrs   Lab Results:  Recent Labs    08/09/21 0213 08/10/21 0344  WBC 6.0 4.9  HGB 6.9* 7.7*  HCT 20.6* 21.7*  PLT 144* 146*   BMET Recent Labs    08/08/21 0812 08/10/21 0344  NA 136 132*  K 3.1* 3.2*  CL 98 96*  CO2 26 26  GLUCOSE 70 101*  BUN 29* 18  CREATININE 4.80* 4.24*  CALCIUM 8.7* 8.3*   PT/INR No results for input(s): LABPROT, INR in the last 72 hours.  CMP     Component Value Date/Time   NA 132 (L) 08/10/2021 0344   NA 135 05/24/2020 1119   K 3.2 (L) 08/10/2021 0344   CL 96 (L) 08/10/2021 0344   CO2 26 08/10/2021 0344   GLUCOSE 101 (H) 08/10/2021 0344   BUN 18 08/10/2021 0344   BUN 25 05/24/2020 1119   CREATININE 4.24 (H) 08/10/2021 0344   CREATININE 2.90 (H) 08/11/2015 1359   CALCIUM 8.3 (L) 08/10/2021 0344   PROT 5.9 (L) 08/04/2021 0241   PROT 7.5 05/24/2020 1119   ALBUMIN 1.6 (L) 08/08/2021 0812   ALBUMIN 4.1 05/24/2020 1119   AST 10 (L) 08/04/2021 0241   ALT 9 08/04/2021 0241   ALKPHOS 180 (H) 08/04/2021 0241   BILITOT 1.5 (H) 08/04/2021 0241   BILITOT 2.4 (H) 05/24/2020 1119   GFRNONAA 15 (L) 08/10/2021 0344    GFRNONAA 23 (L) 08/11/2015 1359   GFRAA 24 (L) 05/24/2020 1119   GFRAA 27 (L) 08/11/2015 1359   Lipase     Component Value Date/Time   LIPASE 30 06/27/2021 0830       Studies/Results: No results found.  Anti-infectives: Anti-infectives (From admission, onward)    Start     Dose/Rate Route Frequency Ordered Stop   08/09/21 1600  [MAR Hold]  metroNIDAZOLE (FLAGYL) IVPB 500 mg        (MAR Hold since Thu 08/10/2021 at 0824.Hold Reason: Transfer to a Procedural area)   500 mg 100 mL/hr over 60 Minutes Intravenous Every 12 hours 08/09/21 1418     08/09/21 1500  [MAR Hold]  cefTRIAXone (ROCEPHIN) 2 g in sodium chloride 0.9 % 100 mL IVPB        (MAR Hold since Thu 08/10/2021 at 0824.Hold Reason: Transfer to a Procedural area)   2 g 200 mL/hr over 30 Minutes Intravenous Every 24 hours 08/09/21 1418     08/05/21 2200  piperacillin-tazobactam (ZOSYN) IVPB 2.25 g  Status:  Discontinued        2.25 g 100 mL/hr over 30 Minutes Intravenous Every 8 hours 08/05/21 1455 08/09/21 1418   08/04/21 2200  piperacillin-tazobactam (ZOSYN)  IVPB 3.375 g  Status:  Discontinued        3.375 g 12.5 mL/hr over 240 Minutes Intravenous Every 8 hours 08/04/21 1927 08/05/21 1455        Assessment/Plan 2 months s/p ex lap with cholecystectomy, SBR, now with recurrent SBO and intra-abdominal fluid collection -drain placement by IR, 2/19.  Cultures reveal E. Coli, bacteroides fragilis, beta lactamase positive, pan sensitive -drain with serous drainage and 20cc output, suspect mostly flush.  Will need repeat imaging in a couple of days to reassess. -SBO vs ileus resolved.  -may advance diet post EGD per GI, may return up to soft diet from our standpoint -mobilize as able  FEN - NPO for scope, may return to soft diet from our standpoint VTE - on hold due to UGI bleed ID - [email or web address]  UGI bleed, EGD today ESRD on HD CHF Severe MR, TR A fib HTN Sickle Cell disease Anemia   LOS: 10 days    Patrick [name] , [facility] Surgery 08/10/2021, 8:27 AM Please see Amion for pager number during day hours 7:00am-4:30pm or 7:00am -11:30am on weekends

## 2021-08-10 NOTE — Anesthesia Postprocedure Evaluation (Signed)
Anesthesia Post Note  Patient: Patrick [name]  Procedure(s) Performed: ESOPHAGOGASTRODUODENOSCOPY (EGD) WITH PROPOFOL HOT HEMOSTASIS (ARGON PLASMA COAGULATION/BICAP)     Patient location during evaluation: PACU Anesthesia Type: MAC Level of consciousness: awake Pain management: pain level controlled Vital Signs Assessment: post-procedure vital signs reviewed and stable Respiratory status: spontaneous breathing Cardiovascular status: stable Postop Assessment: no apparent nausea or vomiting Anesthetic complications: no   No notable events documented.  Last Vitals:  Vitals:   08/10/21 0955 08/10/21 1000  BP: 119/70   Pulse: 91 89  Resp: 16 15  Temp: (!) 36.4 C   SpO2: 100% 94%    Last Pain:  Vitals:   08/10/21 0940  TempSrc:   PainSc: 0-No pain                 Maridel Pixler

## 2021-08-10 NOTE — Anesthesia Procedure Notes (Signed)
Procedure Name: MAC Date/Time: 08/10/2021 9:11 AM Performed by: Mariea Clonts, CRNA Pre-anesthesia Checklist: Patient identified, Emergency Drugs available, Suction available, Patient being monitored and Timeout performed Patient Re-evaluated:Patient Re-evaluated prior to induction Oxygen Delivery Method: Simple face mask and Nasal cannula Preoxygenation: Pre-oxygenation with 100% oxygen

## 2021-08-10 NOTE — Progress Notes (Signed)
[facility] Progress Note   Subjective:   Seen on HD - 1L UFG and tolerated. Went for EGD this morning, noted to have several AVMs which were treated with APC. Denies CP/dyspnea today.  Objective Vitals:   08/10/21 1213 08/10/21 1216 08/10/21 1225 08/10/21 1230  BP:  103/64 (!) 100/57 101/69  Pulse:  67 87 88  Resp: 13 16 15 15   Temp:  97.7 F (36.5 C)    TempSrc:      SpO2:  100%  100%  Weight:  55.9 kg    Height:       Physical Exam General: Frail appearing man, room air.  Heart: RRR; no murmur Lungs: CTA anteriorly Abdomen: soft, drain to LLQ with purulent output Extremities: No LE edema Dialysis Access:  RUE AVF + bruit  Additional Objective Labs: Basic Metabolic Panel: Recent Labs  Lab 08/05/21 0345 08/07/21 0435 08/08/21 0812 08/10/21 0344 08/10/21 1034  NA 134*   < > 136 132* 132*  K 3.8   < > 3.1* 3.2* 3.3*  CL 98   < > 98 96* 96*  CO2 21*   < > 26 26 26   GLUCOSE 75   < > 70 101* 98  BUN 34*   < > 29* 18 20  CREATININE 3.73*   < > 4.80* 4.24* 4.17*  CALCIUM 8.8*   < > 8.7* 8.3* 8.3*  PHOS 3.9  --  4.3  --  2.5   < > = values in this interval not displayed.   Liver Function Tests: Recent Labs  Lab 08/04/21 0241 08/05/21 0345 08/08/21 0812 08/10/21 1034  AST 10*  --   --   --   ALT 9  --   --   --   ALKPHOS 180*  --   --   --   BILITOT 1.5*  --   --   --   PROT 5.9*  --   --   --   ALBUMIN 1.5*   1.5* 1.6* 1.6* 1.6*   CBC: Recent Labs  Lab 08/04/21 0241 08/04/21 1649 08/06/21 0219 08/07/21 0435 08/08/21 0123 08/09/21 0213 08/10/21 0344  WBC 9.6   < > 9.4 8.9 6.9 6.0 4.9  NEUTROABS 7.6  --   --   --   --   --   --   HGB 7.8*   < > 7.9* 8.1* 7.8* 6.9* 7.7*  HCT 22.1*   < > 23.1* 23.1* 22.2* 20.6* 21.7*  MCV 95.7   < > 99.1 98.7 98.2 98.1 95.6  PLT 208   < > 193 169 154 144* 146*   < > = values in this interval not displayed.   Blood Culture    Component Value Date/Time   SDES ABSCESS 08/06/2021 1110   SPECREQUEST  NONE 08/06/2021 1110   CULT  08/06/2021 1110    FEW ESCHERICHIA COLI ABUNDANT BACTEROIDES FRAGILIS BETA LACTAMASE POSITIVE Performed at [facility], [place] [address], [place], [place] [address]    REPTSTATUS 08/09/2021 FINAL 08/06/2021 1110   Medications:  sodium chloride     sodium chloride     sodium chloride     sodium chloride     sodium chloride     cefTRIAXone (ROCEPHIN)  IV 2 g (08/09/21 1452)   metronidazole 500 mg (08/10/21 0322)   promethazine (PHENERGAN) injection (IM or IVPB) Stopped (08/02/21 0135)    (feeding supplement) PROSource Plus  30 mL Oral BID BM   allopurinol  100 mg Oral Daily   Chlorhexidine Gluconate Cloth  6 each Topical Q0600   darbepoetin (ARANESP) injection - DIALYSIS  200 mcg Intravenous Q Sat-HD   ferric citrate  420 mg Oral TID with meals   folic acid  1 mg Oral Daily   hydroxyurea  500 mg Oral Daily   pantoprazole (PROTONIX) IV  40 mg Intravenous Q12H   polyethylene glycol  17 g Oral Daily   potassium chloride  40 mEq Oral Once   sodium chloride flush  3 mL Intravenous Q12H   sodium chloride flush  5 mL Intracatheter Q8H    Dialysis Orders: TTS - AF 4hrs, 400/500,  EDW 55.5kg, 3K/ 2Ca UFP2  RU AVF no heparin - Mircera 225 mcg q2wks - last 07/20/21 - Hectorol 65mcg IV qHD   - Sensipar 30mg  qHD   Assessment/Plan:  SBO with Abdominal abscess: NG tube now out. S/p abscess drain placement 2/19 -> Cx grew E.Coli -> on IV ZOsyn. Symptomatic acute on chronic anemia of CKD - Hx recurrent GIB. Hgb 5.2 on admit s/p 2 unit pRBC improved to 7.6, then drop to 6.6 post HD.  Given another 1U PRBCs on 2/14. Hgb lower 2/22 melena - s/p another 1U blood and underwent EGD this morning. Continues Aranesp 278mcg q Saturday.  ESRD: Continue HD per usual TTS schedule - HD today. Hypokalemia: Using higher K bath with HD.  Hypotension/volume: BP runs soft.  Avoiding midodrine with prolonged QT. 1L UFG if tolerates. Secondary Hyperparathyroidism: Corrected  calcium is high, holding VDRA.  Phos ok, continue binders and restart sensipar once tolerating PO. Severe protein calorie malnutrition: Slowly advancing diet, protein supplements ordered. A fib - not on eliquis d/t GIB Sickle cell anemia - chronic, having back pain this admission  Veneta Penton, PA-C 08/10/2021, 12:49 PM  [facility]

## 2021-08-10 NOTE — Op Note (Signed)
[facility] Patient Name: Patrick [name] Procedure Date : 08/10/2021 MRN: [id number] Attending MD: Jackquline Denmark , MD Date of Birth: 07/24/1955 CSN: [id number] Age: 66 Admit Type: Outpatient Procedure:                Upper GI endoscopy Indications:              Melena in pt with H/O gastric AVMs. Providers:                Jackquline Denmark, MD, Doristine Johns, RN, [facility]                            Technician, Technician Referring MD:              Medicines:                Monitored Anesthesia Care Complications:            No immediate complications. Estimated Blood Loss:     Estimated blood loss: none. Procedure:                Pre-Anesthesia Assessment:                           - Prior to the procedure, a History and Physical                            was performed, and patient medications and                            allergies were reviewed. The patient's tolerance of                            previous anesthesia was also reviewed. The risks                            and benefits of the procedure and the sedation                            options and risks were discussed with the patient.                            All questions were answered, and informed consent                            was obtained. Prior Anticoagulants: The patient has                            taken no previous anticoagulant or antiplatelet                            agents. ASA Grade Assessment: IV - A patient with                            severe systemic disease that is a constant threat  to life. After reviewing the risks and benefits,                            the patient was deemed in satisfactory condition to                            undergo the procedure.                           After obtaining informed consent, the endoscope was                            passed under direct vision. Throughout the                            procedure, the  patient's blood pressure, pulse, and                            oxygen saturations were monitored continuously. The                            [id number]-H190 ([id number]) Olympus endoscope was introduced                            through the mouth, and advanced to the second part                            of duodenum. The upper GI endoscopy was                            accomplished without difficulty. The patient                            tolerated the procedure well. Scope In: Scope Out: Findings:      The examined esophagus was normal.      A 3 cm hiatal hernia was present.      A few small angioectasias with no bleeding were found at the       gastroesophageal junction and in the proximal gastric body along the       lesser curvature. Looked better as compared to the previous EGD.       Fulguration to ablate the lesion by argon plasma at 0.4 liters/minute       and 20 watts was successful. Estimated blood loss: none. 3 endoscopic       clips were noted in the body of the stomach. Small gastric polyps       measuring 4 to 6 mm were noted.      The examined duodenum was normal. Impression:               - 3 cm hiatal hernia.                           - A few non-bleeding angioectasias in the stomach.  Treated with argon plasma coagulation (APC).                           - No specimens collected. Recommendation:           - Patient has a contact number available for                            emergencies. The signs and symptoms of potential                            delayed complications were discussed with the                            patient. Return to normal activities tomorrow.                            Written discharge instructions were provided to the                            patient.                           - Full liquid diet today. Advance to previous diet                            in AM.                           - Continue Protonix 40 mg p.o.  twice daily x 8                            weeks, then once a day.                           - Carafate elixir 1g po BID x 2 weeks (Lesser dose                            and shorter duration due to end-stage renal                            disease.)                           - The findings and recommendations were discussed                            with the patient. Procedure Code(s):        --- Professional ---                           [phone], Esophagogastroduodenoscopy, flexible,                            transoral; with ablation of tumor(s), polyp(s), or  other lesion(s) (includes pre- and post-dilation                            and guide wire passage, when performed) Diagnosis Code(s):        --- Professional ---                           K44.9, Diaphragmatic hernia without obstruction or                            gangrene                           K31.819, Angiodysplasia of stomach and duodenum                            without bleeding                           K92.1, Melena (includes Hematochezia) CPT copyright 2019 American [employer] Association. All rights reserved. The codes documented in this report are preliminary and upon coder review may  be revised to meet current compliance requirements. Jackquline Denmark, MD 08/10/2021 9:53:44 AM This report has been signed electronically. Number of Addenda: 0

## 2021-08-11 ENCOUNTER — Inpatient Hospital Stay (HOSPITAL_COMMUNITY): Payer: HMO

## 2021-08-11 ENCOUNTER — Other Ambulatory Visit: Payer: Self-pay | Admitting: Family Medicine

## 2021-08-11 ENCOUNTER — Other Ambulatory Visit (HOSPITAL_COMMUNITY): Payer: Self-pay

## 2021-08-11 DIAGNOSIS — I4891 Unspecified atrial fibrillation: Secondary | ICD-10-CM

## 2021-08-11 DIAGNOSIS — M79601 Pain in right arm: Secondary | ICD-10-CM

## 2021-08-11 LAB — CBC
HCT: 26.5 % — ABNORMAL LOW (ref 39.0–52.0)
Hemoglobin: 9 g/dL — ABNORMAL LOW (ref 13.0–17.0)
MCH: 33.3 pg (ref 26.0–34.0)
MCHC: 34 g/dL (ref 30.0–36.0)
MCV: 98.1 fL (ref 80.0–100.0)
Platelets: 151 10*3/uL (ref 150–400)
RBC: 2.7 MIL/uL — ABNORMAL LOW (ref 4.22–5.81)
RDW: 20.8 % — ABNORMAL HIGH (ref 11.5–15.5)
WBC: 6.5 10*3/uL (ref 4.0–10.5)
nRBC: 1.4 % — ABNORMAL HIGH (ref 0.0–0.2)

## 2021-08-11 MED ORDER — RENA-VITE PO TABS: 1.0000 | ORAL_TABLET | Freq: Every day | ORAL | Status: DC

## 2021-08-11 MED ORDER — IOHEXOL 300 MG/ML  SOLN
100.0000 mL | Freq: Once | INTRAMUSCULAR | Status: AC | PRN
  Administered 2021-08-11: 100 mL via INTRAVENOUS

## 2021-08-11 MED ORDER — CHLORHEXIDINE GLUCONATE CLOTH 2 % EX PADS: 6.0000 | MEDICATED_PAD | Freq: Every day | CUTANEOUS | Status: DC

## 2021-08-11 MED ORDER — CARVEDILOL 3.125 MG PO TABS
3.1250 mg | ORAL_TABLET | Freq: Two times a day (BID) | ORAL | 3 refills | Status: DC
  Filled 2021-08-11: qty 60, 30d supply, fill #0

## 2021-08-11 MED ORDER — NEPRO/CARBSTEADY PO LIQD: 237.0000 mL | Freq: Two times a day (BID) | ORAL | Status: DC

## 2021-08-11 NOTE — Progress Notes (Signed)
Initial Nutrition Assessment  DOCUMENTATION CODES:   Severe malnutrition in context of chronic illness, Underweight  INTERVENTION:  - Renavit daily   - Nepro Shake po BID, each supplement provides 425 kcal and 19 grams protein  - Transition to room service with assist   - Recommend checking vitamin A, C, and zinc labs   NUTRITION DIAGNOSIS:   Severe Malnutrition related to chronic illness (ESRD on HD) as evidenced by severe muscle depletion, severe fat depletion, percent weight loss.   GOAL:   Patient will meet greater than or equal to 90% of their needs   MONITOR:   PO intake, Supplement acceptance, Labs, I & O's, Weight trends  REASON FOR ASSESSMENT:   LOS, Other (Comment) (Low BMI, rounds)    ASSESSMENT:   Pt is a 66 year old made with PMH significant of ESRD on HD, sickle cell disease, legally blind, A-fib not on eliquis d/t GI bleed, CHF, recent GI bleed and small bowel obstruction s/p bowel resection and open cholecystectomy who was referred to the ED from HD center for hemoglobin of 5.8 and presented with shortness of breath with exertion and fatigue and was admitted for the evaluation of symptomatic acute on chronic anemia of CKD.  2/15 - EGD: bleeding AVM s/p APC  2/17 - NGT placed d/t SBO with pelvic abscess on CT  2/19 - percutaneous pelvic drain placed by IR  2/21 - NGT removed  2/22 - redeveloped melena; hemoglobin: 6.9 - packed red blood cells transfusion  2/23 - EGD: nonbleeding AVM-s/p APC   Last HD 2/23 net UF of 507 ml. Unable to pull more fluid d/t hypotension  Meal documentation: PO intake: 50-100%   Met with pt at bedside. Per pt, pt endorses a good appetite today and that he ate all of his breakfast today, except all of his hash browns. Per pt, he reports that he typically eats 2-3 meals a day, prior to admission. Pt reports that he lives alone and cooks for himself, and that a family member helps him with his grocery shopping. Per pt, pt takes  phosphate binders before every meal and states that he used to take two per each meal, but now he only has to take one phosphate binder per meal. Per pt, pt does not take a renal multivitamin at home. Pt reports that he receives HD three times a week and typically arrives to HD around 5 AM. Pt reports that even on HD days, that he still manages to eat 2-3 meals a day. Per pt, he is provided a butter pecan Nepro shake at HD, which he states that he enjoys the protein shake. Per chart review, pt has currently been receiving Prosource Plus, however, pt denies or unsure if he has been drinking the supplement. Per medications, pt was given one this morning at 0913. Discussed with pt the importance of good nutrition and adequate PO intake for wound healing and meeting nutritional needs. Encouraged pt to continue eating consistent meals throughout the day while he endorses a good appetite and oral nutritional supplements to aid in caloric and protein intake. Pt agreeable to receiving Nepro (butter-pecan) nutritional supplement. Discussed MVI supplementation with pt and pt agreeable to taking a renal MVI.   24-hour recall (PTA): Breakfast: breakfast burrito with eggs, sausage, cheese, tortilla with water (prepared at home by pt)  Lunch: varies; typically a microwave meal referred to as a "big box" from a meals on wheels and/or food assistance program that comes with a protein  Dinner: [employer] with protein Snacks: potato chips, popcorn   Pt with non-healing wounds and dietetic intern recommend checking vitamin A, C and zinc labs.   - Admit wt: 58.7 kg - Current wt: 52.6 kg  - EDW: 55.5 kg  Pt with estimated dry weight of 65 kg in 05/2021. Pt's current EDW is 55.5. Per documented EDW, pt has lost 15% of EDW since 05/2021, which is significant for time frame. Per pt, he reports that a usual EDW for him is 60 kg.   Medications reviewed and include: protonix, miralax, Prosource Plus 30 ml BID, potassium  chloride SA, ferric citrate, folic acid, aranesp  Labs reviewed and include:  Hemoglobin: 9.0  Sodium: 132 Potassium: 3.3  Corrected calcium: 10.22 (high)   NUTRITION - FOCUSED PHYSICAL EXAM:  Flowsheet Row Most Recent Value  Orbital Region Moderate depletion  Upper Arm Region Severe depletion  Thoracic and Lumbar Region Severe depletion  Buccal Region Moderate depletion  Temple Region Severe depletion  Clavicle Bone Region Severe depletion  Clavicle and Acromion Bone Region Severe depletion  Scapular Bone Region Severe depletion  Dorsal Hand Severe depletion  Patellar Region Severe depletion  Anterior Thigh Region Severe depletion  Posterior Calf Region Severe depletion  Edema (RD Assessment) None  Hair Reviewed  Eyes Reviewed  Mouth Reviewed  Skin Reviewed  Nails Reviewed       Diet Order:   Diet Order             Diet - low sodium heart healthy           DIET SOFT Room service appropriate? Yes; Fluid consistency: Thin  Diet effective now                   EDUCATION NEEDS:   Education needs have been addressed  Skin:  Skin Assessment: Skin Integrity Issues: Skin Integrity Issues:: Stage II, Incisions Stage II: mid sacrum; left sacrum Incisions: abdomen  Last BM:  2/23 - type 5  Height:   Ht Readings from Last 1 Encounters:  08/01/21 _0  (1.803 m)    Weight:   Wt Readings from Last 1 Encounters:  08/11/21 52.6 kg    BMI:  Body mass index is 16.17 kg/m.  Estimated Nutritional Needs:   Kcal:  1900 - 2100  Protein:  100 - 115 grams  Fluid:  1000 mL + UOP    Maryruth Hancock, Dietetic Intern 08/11/2021 3:17 PM

## 2021-08-11 NOTE — Progress Notes (Signed)
Supervising Physician: Aletta Edouard  Patient Status:  [facility] - In-pt  Chief Complaint:  Abdominal abscess  Subjective:  To discharge home today.  Medicine and Surgery both requesting drain removal today.   Patient agreeable  Allergies: Patient has no known allergies.  Medications: Prior to Admission medications   Medication Sig Start Date End Date Taking? Authorizing Provider  Acetaminophen (TYLENOL 8 HOUR PO) Patient takes two 500 mg tablets as needed for pain.   Yes [provider]  albuterol (VENTOLIN HFA) 108 (90 Base) MCG/ACT inhaler Inhale 2 puffs into the lungs every 6 (six) hours as needed for wheezing or shortness of breath. 05/09/20  Yes Freddi Starr, MD  allopurinol (ZYLOPRIM) 100 MG tablet TAKE 1 TABLET BY MOUTH EVERY DAY Patient taking differently: Take 100 mg by mouth daily. 07/13/21  Yes Dorena Dew, FNP  AURYXIA 1 GM 210 MG(Fe) tablet Take 420 mg by mouth 3 (three) times daily. 02/11/21  Yes [provider]  folic acid (FOLVITE) 1 MG tablet TAKE 1 TABLET BY MOUTH EVERY DAY Patient taking differently: Take 1 mg by mouth daily. 02/22/21  Yes Dorena Dew, FNP  Glycerin-Hypromellose-PEG 400 (VISINE DRY EYE OP) Place 1 drop into both eyes as needed (dryness).   Yes [provider]  hydroxyurea (HYDREA) 500 MG capsule Take 1 capsule (500 mg total) by mouth daily. TAKE 1 CAPSULE BY MOUTH EVERY DAY WITH FOOD TO MINIMIZE GI SIDE EFFECTS Patient taking differently: Take 500 mg by mouth daily before breakfast. 01/31/21  Yes Dorena Dew, FNP  lidocaine-prilocaine (EMLA) cream Apply 1 application topically daily as needed (port access).  09/08/19  Yes [provider]  oxycodone (OXY-IR) 5 MG capsule Take 2 capsules (10 mg total) by mouth every 6 (six) hours as needed for pain. 07/31/21  Yes Dorena Dew, FNP  pantoprazole (PROTONIX) 40 MG tablet TAKE 1 TABLET BY MOUTH TWICE A DAY Patient taking differently: Take 40  mg by mouth 2 (two) times daily. 07/26/21  Yes Willia Craze, NP  tiZANidine (ZANAFLEX) 4 MG tablet Take 0.5 tablets (2 mg total) by mouth every 8 (eight) hours as needed for muscle spasms. 07/18/21  Yes Dorena Dew, FNP  torsemide (DEMADEX) 20 MG tablet Take 20 mg by mouth in the morning, at noon, in the evening, and at bedtime.   Yes [provider]  Vitamin D, Ergocalciferol, (DRISDOL) 1.25 MG (50000 UNIT) CAPS capsule TAKE 1 CAPSULE BY MOUTH ONE TIME PER WEEK Patient taking differently: Take 50,000 Units by mouth once a week. Monday 05/15/21  Yes Dorena Dew, FNP  carvedilol (COREG) 3.125 MG tablet Take 1 tablet (3.125 mg total) by mouth 2 (two) times daily. 08/11/21   Ghimire, Henreitta Leber, MD  gabapentin (NEURONTIN) 100 MG capsule TAKE 1 CAPSULE BY MOUTH EVERYDAY AT BEDTIME Patient not taking: Reported on 07/31/2021 12/12/20   Dorena Dew, FNP     Vital Signs: BP 105/65 (BP Location: Left Arm)    Pulse 76    Temp 97.8 F (36.6 C) (Oral)    Resp 16    Ht 5\' 11"  (1.803 m)    Wt 115 lb 15.4 oz (52.6 kg)    SpO2 100%    BMI 16.17 kg/m   Physical Exam Vitals reviewed.  HENT:     Head: Normocephalic and atraumatic.     Mouth/Throat:     Pharynx: Oropharynx is clear.  Cardiovascular:     Rate and  Rhythm: Normal rate.  Pulmonary:     Effort: Pulmonary effort is normal.  Abdominal:     General: Abdomen is flat.     Palpations: Abdomen is soft.  Skin:    General: Skin is warm and dry.  Neurological:     General: No focal deficit present.     Mental Status: He is alert.  Psychiatric:        Mood and Affect: Mood normal.        Behavior: Behavior normal.    Imaging: CT ABDOMEN PELVIS W CONTRAST  Result Date: 08/11/2021 CLINICAL DATA:  66 year old male status post abdominal abscess drain placement on 08/06/2021. Follow-up study. EXAM: CT ABDOMEN AND PELVIS WITH CONTRAST TECHNIQUE: Multidetector CT imaging of the abdomen and pelvis was performed using the  standard protocol following bolus administration of intravenous contrast. RADIATION DOSE REDUCTION: This exam was performed according to the departmental dose-optimization program which includes automated exposure control, adjustment of the mA and/or kV according to patient size and/or use of iterative reconstruction technique. CONTRAST:  128mL OMNIPAQUE IOHEXOL 300 MG/ML  SOLN COMPARISON:  07/06/2021 FINDINGS: Lower chest: Global cardiomegaly. Trace bilateral pleural effusions. Bibasilar subsegmental atelectasis. Hepatobiliary: No focal liver abnormality is seen. Status post cholecystectomy. No biliary dilatation. Pancreas: Unremarkable. No pancreatic ductal dilatation or surrounding inflammatory changes. Spleen: Chronic atrophy with calcifications. Adrenals/Urinary Tract: Adrenal glands are unremarkable. Similar appearing multifocal bilateral simple renal cysts. Kidneys are normal, without renal calculi, focal lesion, or hydronephrosis. Bladder is mostly decompressed. Stomach/Bowel: Stomach is within normal limits. Appendix is not definitively identified. Right lower quadrant entero-enterostomy anastomosis. Scattered areas of mildly dilated small bowel with a few scattered air-fluid levels. Vascular/Lymphatic: Aortic atherosclerosis. No enlarged abdominal or pelvic lymph nodes. Reproductive: Prostate is poorly visualized. Other: Midline lower abdominal indwelling pigtail drain is in unchanged position with interval resolution of the previously visualized gas containing fluid collection. Musculoskeletal: Similar appearing diffuse, heterogeneous sclerotic appearance of the visualized axial skeleton. No acute osseous abnormality. IMPRESSION: 1. Interval resolution of previously visualized lower abdominal abscess. Unchanged position of indwelling drain. 2. Mild distension of scattered areas of small bowel as could be seen with ileus obstruction, though globally decreased from comparison. 3. Similar appearing trace  bilateral pleural effusions and bibasilar subsegmental atelectasis. 4. Similar appearing global cardiomegaly. Ruthann Cancer, MD [facility] Electronically Signed   By: Ruthann Cancer M.D.   On: 08/11/2021 13:22    Labs:  CBC: Recent Labs    08/08/21 0123 08/09/21 0213 08/10/21 0344 08/11/21 0830  WBC 6.9 6.0 4.9 6.5  HGB 7.8* 6.9* 7.7* 9.0*  HCT 22.2* 20.6* 21.7* 26.5*  PLT 154 144* 146* 151    COAGS: Recent Labs    05/29/21 0113 05/29/21 1327 06/27/21 0830 08/06/21 0219  INR 1.3* 1.3* 1.9* 1.4*  APTT  --  33  --   --     BMP: Recent Labs    08/07/21 0435 08/08/21 0812 08/10/21 0344 08/10/21 1034  NA 137 136 132* 132*  K 4.3 3.1* 3.2* 3.3*  CL 99 98 96* 96*  CO2 25 26 26 26   GLUCOSE 75 70 101* 98  BUN 24* 29* 18 20  CALCIUM 8.8* 8.7* 8.3* 8.3*  CREATININE 3.63* 4.80* 4.24* 4.17*  GFRNONAA 18* 13* 15* 15*    LIVER FUNCTION TESTS: Recent Labs    06/05/21 0350 06/06/21 0719 06/27/21 0830 07/31/21 1611 08/01/21 0758 08/04/21 0241 08/05/21 0345 08/08/21 0812 08/10/21 1034  BILITOT 1.5*  --  3.0* 1.4*  --  1.5*  --   --   --   AST 20  --  23 14*  --  10*  --   --   --   ALT 16  --  14 10  --  9  --   --   --   ALKPHOS 263*  --  563* 237*  --  180*  --   --   --   PROT 5.4*  --  6.1* 6.5  --  5.9*  --   --   --   ALBUMIN 1.7*   < > <1.5* <1.5*   < > 1.5*   1.5* 1.6* 1.6* 1.6*   < > = values in this interval not displayed.    Assessment and Plan:  Abdominal abscess Drain placed 08/06/21, 20-40cc daily OP, clear in appearance, no material aspirated today CT shows resolution. Drain removed without complication at bedside and site bandaged.      Electronically Signed: Pasty Spillers, PA 08/11/2021, 2:25 PM   I spent a total of 15 Minutes at the the patient's bedside AND on the patient's hospital floor or unit, greater than 50% of which was counseling/coordinating care for drain  removal

## 2021-08-11 NOTE — Progress Notes (Signed)
D/C order noted. Contacted [facility] and spoke to [place]. Clinic aware pt will d/c today and will resume care tomorrow.   Melven Sartorius Renal Navigator [phone]

## 2021-08-11 NOTE — Progress Notes (Signed)
[facility] Progress Note   Subjective:   Patient seen and examined at bedside.  Feeling better, about to eat breakfast.  Reports back pain and abdominal pain during HD yesterday.  Denies current pain, n/v/d, CP, SOB, dizziness and fatigue.    Objective Vitals:   08/11/21 0404 08/11/21 0500 08/11/21 0807 08/11/21 1124  BP: (!) 101/54  116/77 105/65  Pulse: 74  71 76  Resp: 20  14 16   Temp: 98 F (36.7 C)  97.6 F (36.4 C) 97.8 F (36.6 C)  TempSrc: Oral  Oral Oral  SpO2: 100%  98% 100%  Weight:  52.6 kg    Height:       Physical Exam General:well appearing frail male in NAD, sitting in bedside chair Heart:RRR, no mrg Lungs:CTAB, nml WOB Abdomen:soft, NTND, drain in LLQ +serosanguinous output Extremities:no LE edema Dialysis Access: RU AVF +b/t   Filed Weights   08/10/21 1216 08/10/21 1530 08/11/21 0500  Weight: 55.9 kg 52.6 kg 52.6 kg    Intake/Output Summary (Last 24 hours) at 08/11/2021 1250 Last data filed at 08/11/2021 1108 Gross per 24 hour  Intake 390 ml  Output 552 ml  Net -162 ml    Additional Objective Labs: Basic Metabolic Panel: Recent Labs  Lab 08/05/21 0345 08/07/21 0435 08/08/21 0812 08/10/21 0344 08/10/21 1034  NA 134*   < > 136 132* 132*  K 3.8   < > 3.1* 3.2* 3.3*  CL 98   < > 98 96* 96*  CO2 21*   < > 26 26 26   GLUCOSE 75   < > 70 101* 98  BUN 34*   < > 29* 18 20  CREATININE 3.73*   < > 4.80* 4.24* 4.17*  CALCIUM 8.8*   < > 8.7* 8.3* 8.3*  PHOS 3.9  --  4.3  --  2.5   < > = values in this interval not displayed.   Liver Function Tests: Recent Labs  Lab 08/05/21 0345 08/08/21 0812 08/10/21 1034  ALBUMIN 1.6* 1.6* 1.6*    CBC: Recent Labs  Lab 08/07/21 0435 08/08/21 0123 08/09/21 0213 08/10/21 0344 08/11/21 0830  WBC 8.9 6.9 6.0 4.9 6.5  HGB 8.1* 7.8* 6.9* 7.7* 9.0*  HCT 23.1* 22.2* 20.6* 21.7* 26.5*  MCV 98.7 98.2 98.1 95.6 98.1  PLT 169 154 144* 146* 151   Blood Culture    Component Value Date/Time    SDES ABSCESS 08/06/2021 1110   SPECREQUEST NONE 08/06/2021 1110   CULT  08/06/2021 1110    FEW ESCHERICHIA COLI ABUNDANT BACTEROIDES FRAGILIS BETA LACTAMASE POSITIVE Performed at [facility], [place] [address] [place], [place] [address]    REPTSTATUS 08/09/2021 FINAL 08/06/2021 1110     Medications:  sodium chloride     sodium chloride     sodium chloride     cefTRIAXone (ROCEPHIN)  IV 2 g (08/10/21 1630)   metronidazole 500 mg (08/11/21 0336)   promethazine (PHENERGAN) injection (IM or IVPB) Stopped (08/02/21 0135)    (feeding supplement) PROSource Plus  30 mL Oral BID BM   allopurinol  100 mg Oral Daily   Chlorhexidine Gluconate Cloth  6 each Topical Q0600   darbepoetin (ARANESP) injection - DIALYSIS  200 mcg Intravenous Q Sat-HD   ferric citrate  420 mg Oral TID with meals   folic acid  1 mg Oral Daily   hydroxyurea  500 mg Oral Daily   metoprolol tartrate  12.5 mg Oral BID   pantoprazole (PROTONIX)  IV  40 mg Intravenous Q12H   polyethylene glycol  17 g Oral Daily   potassium chloride  40 mEq Oral Once   sodium chloride flush  3 mL Intravenous Q12H   sodium chloride flush  5 mL Intracatheter Q8H    Dialysis Orders: TTS - AF 4hrs, 400/500,  EDW 55.5kg, 3K/ 2Ca UFP2  RU AVF no heparin - Mircera 225 mcg q2wks - last 07/20/21 - Hectorol 81mcg IV qHD   - Sensipar 30mg  qHD   Assessment/Plan:  SBO with Abdominal abscess: NG tube now out. S/p abscess drain placement 2/19 -> Cx grew E.Coli -> on IV ceftriaxone and flagyl. Plan for repeat CT scan this AM. Symptomatic acute on chronic anemia of CKD - Hx recurrent GIB. Hgb 5.2 on admit s/p 2 unit pRBC improved to 7.6, then drop to 6.6 post HD.  Given another 1U PRBCs on 2/14. Hgb drop again to 6.6 on 2/22 2/2 melena - s/p another 1U blood 2/23.  EGD 2/3 with non-bleeding angioectasias s/p APC.  Continues Aranesp 218mcg q Saturday.  ESRD: Continue HD per usual TTS schedule - HD tomorrow. Hypokalemia: Using higher K bath with  HD.  Hypotension/volume: BP runs soft.  Avoiding midodrine with prolonged QT. 1L UFG if tolerates. Secondary Hyperparathyroidism: Corrected calcium is high, holding VDRA.  Phos ok, continue binders and restart sensipar once tolerating PO. Severe protein calorie malnutrition: Tolerating diet. protein supplements ordered. A fib - not on eliquis d/t GIB Sickle cell anemia - chronic, having back pain this admission  Jen Mow, PA-C [place] 08/11/2021,12:50 PM  LOS: 11 days

## 2021-08-11 NOTE — Progress Notes (Signed)
1 Day Post-Op  Subjective: Patient feels great.  Wanting to go home.  Sitting up in his chair. Eating well and still moving his bowels.  He denies blood as far as he knows.  ROS: See above, otherwise other systems negative  Objective: Vital signs in last 24 hours: Temp:  [97.4 F (36.3 C)-98.3 F (36.8 C)] 97.6 F (36.4 C) (02/24 0807) Pulse Rate:  [67-96] 71 (02/24 0807) Resp:  [10-20] 14 (02/24 0807) BP: (91-119)/(54-91) 116/77 (02/24 0807) SpO2:  [94 %-100 %] 98 % (02/24 0807) Weight:  [52.6 kg-55.9 kg] 52.6 kg (02/24 0500) Last BM Date : 08/10/21  Intake/Output from previous day: 02/23 0701 - 02/24 0700 In: 130 [I.V.:120] Out: 547 [Drains:40] Intake/Output this shift: No intake/output data recorded.  PE: Gen: NAD, laying in bed Abd: soft, NT, ND, midline wound almost completely healed. IR drain in place with clear output.  Appears to mostly be flush present in drain   Lab Results:  Recent Labs    08/09/21 0213 08/10/21 0344  WBC 6.0 4.9  HGB 6.9* 7.7*  HCT 20.6* 21.7*  PLT 144* 146*   BMET Recent Labs    08/10/21 0344 08/10/21 1034  NA 132* 132*  K 3.2* 3.3*  CL 96* 96*  CO2 26 26  GLUCOSE 101* 98  BUN 18 20  CREATININE 4.24* 4.17*  CALCIUM 8.3* 8.3*   PT/INR No results for input(s): LABPROT, INR in the last 72 hours.  CMP     Component Value Date/Time   NA 132 (L) 08/10/2021 1034   NA 135 05/24/2020 1119   K 3.3 (L) 08/10/2021 1034   CL 96 (L) 08/10/2021 1034   CO2 26 08/10/2021 1034   GLUCOSE 98 08/10/2021 1034   BUN 20 08/10/2021 1034   BUN 25 05/24/2020 1119   CREATININE 4.17 (H) 08/10/2021 1034   CREATININE 2.90 (H) 08/11/2015 1359   CALCIUM 8.3 (L) 08/10/2021 1034   PROT 5.9 (L) 08/04/2021 0241   PROT 7.5 05/24/2020 1119   ALBUMIN 1.6 (L) 08/10/2021 1034   ALBUMIN 4.1 05/24/2020 1119   AST 10 (L) 08/04/2021 0241   ALT 9 08/04/2021 0241   ALKPHOS 180 (H) 08/04/2021 0241   BILITOT 1.5 (H) 08/04/2021 0241   BILITOT 2.4 (H)  05/24/2020 1119   GFRNONAA 15 (L) 08/10/2021 1034   GFRNONAA 23 (L) 08/11/2015 1359   GFRAA 24 (L) 05/24/2020 1119   GFRAA 27 (L) 08/11/2015 1359   Lipase     Component Value Date/Time   LIPASE 30 06/27/2021 0830       Studies/Results: No results found.  Anti-infectives: Anti-infectives (From admission, onward)    Start     Dose/Rate Route Frequency Ordered Stop   08/09/21 1600  metroNIDAZOLE (FLAGYL) IVPB 500 mg        500 mg 100 mL/hr over 60 Minutes Intravenous Every 12 hours 08/09/21 1418     08/09/21 1500  cefTRIAXone (ROCEPHIN) 2 g in sodium chloride 0.9 % 100 mL IVPB        2 g 200 mL/hr over 30 Minutes Intravenous Every 24 hours 08/09/21 1418     08/05/21 2200  piperacillin-tazobactam (ZOSYN) IVPB 2.25 g  Status:  Discontinued        2.25 g 100 mL/hr over 30 Minutes Intravenous Every 8 hours 08/05/21 1455 08/09/21 1418   08/04/21 2200  piperacillin-tazobactam (ZOSYN) IVPB 3.375 g  Status:  Discontinued        3.375 g 12.5 mL/hr over  240 Minutes Intravenous Every 8 hours 08/04/21 1927 08/05/21 1455        Assessment/Plan 2 months s/p ex lap with cholecystectomy, SBR, now with recurrent SBO and intra-abdominal fluid collection -drain placement by IR, 2/19.  Cultures reveal E. Coli, bacteroides fragilis, beta lactamase positive, pan sensitive -drain with serous drainage and suspect mostly flush.  Will repeat CT scan today to evaluate drain and further need -SBO vs ileus resolved.  -tolerating soft diet -mobilize as able -patient is surgically stable for DC home after CT scan today, when medically stable.   -will arrange outpatient follow up with Dr. Donne Hazel in 3 weeks or so to check in on him given this incident.  FEN - soft VTE - on hold due to UGI bleed ID - zosyn  UGI bleed, EGD today ESRD on HD CHF Severe MR, TR A fib HTN Sickle Cell disease Anemia   LOS: 11 days    Henreitta Cea , [facility] 08/11/2021, 8:52 AM Please  see Amion for pager number during day hours 7:00am-4:30pm or 7:00am -11:30am on weekends

## 2021-08-11 NOTE — TOC Transition Note (Addendum)
Transition of Care [facility]) - CM/SW Discharge Note   Patient Details  Name: Patrick [name] MRN: [id number] Date of Birth: May 26, 1956  [facility]) CM/SW Contact:  Pollie Friar, RN Phone Number: 08/11/2021, 2:06 PM   Clinical Narrative:    Patient is discharging home with resumption of home health services through [place]. [name] with [facility] aware of d/c home with resumption orders. No other needs per TOC.   Final next level of care: Home/Self Care Barriers to Discharge: No Barriers Identified   Patient Goals and CMS Choice        Discharge Placement                       Discharge Plan and Services                                     Social Determinants of Health (SDOH) Interventions     Readmission Risk Interventions Readmission Risk Prevention Plan 08/05/2020  Transportation Screening Complete  PCP or Specialist Appt within 3-5 Days Complete  HRI or [place] Complete  Social Work Consult for [place] Planning/Counseling Complete  Palliative Care Screening Not Applicable  Medication Review [employer]) Complete  Some recent data might be hidden

## 2021-08-11 NOTE — Discharge Summary (Signed)
PATIENT DETAILS Name: Patrick [name] Age: 66 y.o. Sex: male Date of Birth: [birth year] MRN: [id number]. Admitting Physician: Eben Burow, MD [email or web address], Asencion Partridge, FNP  Admit Date: 07/31/2021 Discharge date: 08/11/2021  Recommendations for Outpatient Follow-up:  Follow up with PCP in 1-2 weeks Please obtain CMP/CBC in one week Please ensure follow-up with general surgery, nephrology  Admitted From:  Home  Disposition: Home   Discharge Condition: good  CODE STATUS:   Code Status: Full Code   Diet recommendation:  Diet Order             Diet - low sodium heart healthy           DIET SOFT Room service appropriate? Yes; Fluid consistency: Thin  Diet effective now                    Brief Summary: 66 year old male with history of ESRD on HD, sickle cell disease, permanent atrial fibrillation, history of UGI due to AVMs, recent history (December 2022) of choledocholithiasis and ischemic small bowel requiring cholecystectomy/biliary stent placement/bowel resection-referred to the ED from HD center for hemoglobin of 5.8-subsequent EGD on 2/15 showed bleeding AVMs.  Hospital course complicated by SBO with pelvic abscess, recurrent upper GI bleeding on 2/22 requiring PRBC transfusion-and a repeat EGD on 2/23.     Significant events: 2/13>> referred to the ED from HD center for evaluation of hemoglobin of 5.8. 2/15>> EGD: Bleeding AVM-s/p APC 2/17>> SBO w pelvic abscess on CT abdomen/pelvis -NGT placed-CCS consulted 2/19>> percutaneous pelvic drain placed by IR 2/21>>NGT removed 2/22>> redeveloped melena-hemoglobin down to 6.9-PRBC transfusion 2/23>> EGD: Nonbleeding AVM-s/p APC   Significant imaging studies: 2/17>> MRI LS spine: Limited exams-no acute abnormalities. 2/17>> CT abd/pelvis: Questionable SBO and abcess   Significant microbiology data: 2/13>> COVID/influenza PCR: Negative 2/19>> pelvic abscess culture: E. coli/Bacteroides    Procedures: 2/15>> EGD: Multiple bleeding angiectasia's in the stomach treated with argon plasma coagulation 2/19>> Pelvic fluid drain placed, 10 [place] 2/23>> EGD: Few small AVMs with no active bleeding s/p treatment with argon plasma coagulation.   Consults: GI, nephrology, Gen Sx  Brief Hospital Course: SBO with pelvic abscess: Managed conservatively-with NG tube/n.p.o. status-clinically improved after draining pelvic abscess.  NG tube has been removed-has tolerated advancement in diet.  Was evaluated by IR-underwent pelvic drain placement-repeat CT on 2/24 shows complete resolution of the abscess.  IR planning to remove drain prior to discharge.  Discussed with general surgery-since abscess has completely resolved-known need to continue antimicrobial therapy on discharge.    Upper GI bleeding with acute blood loss anemia due to AVMs: No further melanotic stools overnight-repeat EGD 2/23 showed nonbleeding AVMs-s/p APC.  Hemoglobin remained stable.  Had a prior EGD during the earlier part of the hospital stay on 2/15 that showed bleeding AVMs.  Continue PPI on discharge.   Recent history of choledocholithiasis-s/p ERCP with pancreatic/biliary stents (removed) followed by open cholecystectomy and resection of ischemic small bowel on 05/24/2021   ESRD: On HD per nephrology.   Chronic HFrEF (EF 40-45% by echo December 2022): Volume status stable-volume removal with HD.   Permanent atrial fibrillation: Rate controlled with Coreg-not a candidate for anticoagulation due to recurrent GI bleeding.   HTN: BP stable-not on any antihypertensives.  Sickle cell disease/Lumbago:Stable-MRI of LS spine without any acute abnormalities although a poor study.  Continue supportive care.  Benign neurological exam.   Legally blind   BMI: Estimated body mass index is 15.77 kg/m as calculated from the  following:   Height as of this encounter: 5\' 11"  (1.803 m).   Weight as of this encounter: 51.3 kg.    Nutrition Status: Nutrition Problem: Severe Malnutrition Etiology: chronic illness (ESRD on HD) Signs/Symptoms: severe muscle depletion, severe fat depletion, percent weight loss Percent weight loss: 15 % Interventions: MVI, Liberalize Diet, Nepro shake (Rena-Vit)   RN pressure injury documentation: Pressure Injury 08/01/21 Sacrum Mid Stage 2 -  Partial thickness loss of dermis presenting as a shallow open injury with a red, pink wound bed without slough. (Active)  08/01/21 0341  Location: Sacrum  Location Orientation: Mid  Staging: Stage 2 -  Partial thickness loss of dermis presenting as a shallow open injury with a red, pink wound bed without slough.  Wound Description (Comments):   Present on Admission: Yes     Pressure Injury 08/01/21 Sacrum Left Stage 2 -  Partial thickness loss of dermis presenting as a shallow open injury with a red, pink wound bed without slough. (Active)  08/01/21 0342  Location: Sacrum  Location Orientation: Left  Staging: Stage 2 -  Partial thickness loss of dermis presenting as a shallow open injury with a red, pink wound bed without slough.  Wound Description (Comments):   Present on Admission: Yes     Discharge Diagnoses:  Principal Problem:   Symptomatic anemia Active Problems:   Essential hypertension   Sickle cell anemia (HCC)   End stage renal disease (HCC)   Permanent atrial fibrillation (HCC)   Chronic systolic CHF (congestive heart failure) (HCC)   Acute blood loss anemia   Hyponatremia   Hypomagnesemia   Pressure injury of skin   Gastric AVM   Hiatal hernia   Discharge Instructions:  Activity:  As tolerated with Full fall precautions use walker/cane & assistance as needed  Discharge Instructions     Call MD for:   Complete by: As directed    If you have melanotic or bloody stools.   Diet - low sodium heart healthy   Complete by: As directed    Discharge instructions   Complete by: As directed    Follow with Primary  MD  Dorena Dew, FNP in 1-2 weeks  Please get a complete blood count and chemistry panel checked by your Primary MD at your next visit, and again as instructed by your Primary MD.  Get Medicines reviewed and adjusted: Please take all your medications with you for your next visit with your Primary MD  Laboratory/radiological data: Please request your Primary MD to go over all hospital tests and procedure/radiological results at the follow up, please ask your Primary MD to get all Hospital records sent to his/her office.  In some cases, they will be blood work, cultures and biopsy results pending at the time of your discharge. Please request that your primary care M.D. follows up on these results.  Also Note the following: If you experience worsening of your admission symptoms, develop shortness of breath, life threatening emergency, suicidal or homicidal thoughts you must seek medical attention immediately by calling 911 or calling your MD immediately  if symptoms less severe.  You must read complete instructions/literature along with all the possible adverse reactions/side effects for all the Medicines you take and that have been prescribed to you. Take any new Medicines after you have completely understood and accpet all the possible adverse reactions/side effects.   Do not drive when taking Pain medications or sleeping medications (Benzodaizepines)  Do not take more than prescribed Pain, Sleep and  Anxiety Medications. It is not advisable to combine anxiety,sleep and pain medications without talking with your primary care practitioner  Special Instructions: If you have smoked or chewed Tobacco  in the last 2 yrs please stop smoking, stop any regular Alcohol  and or any Recreational drug use.  Wear Seat belts while driving.  Please note: You were cared for by a hospitalist during your hospital stay. Once you are discharged, your primary care physician will handle any further medical  issues. Please note that NO REFILLS for any discharge medications will be authorized once you are discharged, as it is imperative that you return to your primary care physician (or establish a relationship with a primary care physician if you do not have one) for your post hospital discharge needs so that they can reassess your need for medications and monitor your lab values.   Follow-up with your hemodialysis clinic at your usual schedule.   Increase activity slowly   Complete by: As directed    No wound care   Complete by: As directed       Allergies as of 08/11/2021   No Known Allergies      Medication List     STOP taking these medications    gabapentin 100 MG capsule Commonly known as: NEURONTIN   torsemide 20 MG tablet Commonly known as: DEMADEX       TAKE these medications    albuterol 108 (90 Base) MCG/ACT inhaler Commonly known as: VENTOLIN HFA Inhale 2 puffs into the lungs every 6 (six) hours as needed for wheezing or shortness of breath.   allopurinol 100 MG tablet Commonly known as: ZYLOPRIM TAKE 1 TABLET BY MOUTH EVERY DAY   Auryxia 1 GM 210 MG(Fe) tablet Generic drug: ferric citrate Take 420 mg by mouth 3 (three) times daily.   carvedilol 3.125 MG tablet Commonly known as: COREG Take 1 tablet (3.125 mg total) by mouth 2 (two) times daily.   folic acid 1 MG tablet Commonly known as: FOLVITE TAKE 1 TABLET BY MOUTH EVERY DAY   hydroxyurea 500 MG capsule Commonly known as: HYDREA Take 1 capsule (500 mg total) by mouth daily. TAKE 1 CAPSULE BY MOUTH EVERY DAY WITH FOOD TO MINIMIZE GI SIDE EFFECTS What changed:  when to take this additional instructions   lidocaine-prilocaine cream Commonly known as: EMLA Apply 1 application topically daily as needed (port access).   oxycodone 5 MG capsule Commonly known as: OXY-IR Take 2 capsules (10 mg total) by mouth every 6 (six) hours as needed for pain.   pantoprazole 40 MG tablet Commonly known as:  PROTONIX TAKE 1 TABLET BY MOUTH TWICE A DAY   tiZANidine 4 MG tablet Commonly known as: ZANAFLEX Take 0.5 tablets (2 mg total) by mouth every 8 (eight) hours as needed for muscle spasms.   TYLENOL 8 HOUR PO Patient takes two 500 mg tablets as needed for pain.   VISINE DRY EYE OP Place 1 drop into both eyes as needed (dryness).   Vitamin D (Ergocalciferol) 1.25 MG (50000 UNIT) Caps capsule Commonly known as: DRISDOL TAKE 1 CAPSULE BY MOUTH ONE TIME PER WEEK What changed: See the new instructions.        Follow-up Information     Rolm Bookbinder, MD Follow up on 09/05/2021.   Specialty: General Surgery Why: 11:00am, arrive by 10:45am for check in process Contact information: [address] [place] [id number] [phone]         Dorena Dew,  FNP. Schedule an appointment as soon as possible for a visit in 1 week(s).   Specialty: Family Medicine Contact information: [place] [place] [id number] [phone]         Dialysis clinic Follow up.   Why: Follow at your usual schedule.               No Known Allergies   Other Procedures/Studies: DG Abd 1 View  Result Date: 08/04/2021 CLINICAL DATA:  NG tube placement. EXAM: ABDOMEN - 1 VIEW COMPARISON:  Abdominopelvic CT earlier today. FINDINGS: Tip of the enteric tube is below the diaphragm, the side port is in the region of the gastroesophageal junction. Recommend advancement of approximately 7 cm to place the side-port below the diaphragm. Mild gaseous gastric distension. IMPRESSION: Tip of the enteric tube below the diaphragm in the stomach, side-port in the region of the gastroesophageal junction. Recommend advancement of approximately 7 cm to place the side-port below the diaphragm. Electronically Signed   By: Keith Rake M.D.   On: 08/04/2021 21:05   MR LUMBAR SPINE WO CONTRAST  Result Date: 08/04/2021 CLINICAL DATA:  66 year old male with low back pain.  Abdominal pain and ascites. EXAM: MRI LUMBAR SPINE WITHOUT CONTRAST TECHNIQUE: Multiplanar, multisequence MR imaging of the lumbar spine was performed. No intravenous contrast was administered. COMPARISON:  CT Abdomen and Pelvis 06/27/2021. FINDINGS: Unfortunately, due to the presence of ascites diagnostic MRI of the lumbar spine sub for due to poor signal to noise acquisition, despite imaging attempts on both 1.5 and 3.0 Tesla scanners. Subsequently, only sagittal T1 weighted imaging could be obtained. Lumbar segmentation appears to be normal and will be designated as such for this report. Diffusely heterogeneous T1 marrow signal throughout the visible spine and pelvis. Comparison to prior CT since last year demonstrates evidence of diffuse renal osteodystrophy with heterogeneous sclerosis. No destructive osseous lesion is identified. Grossly normal lower spinal cord and conus medullaris, which terminates at L1-L2. No evidence of lower thoracic or lumbar spinal stenosis. There is lumbar disc space loss at both L2-L3 and L5-S1, with vacuum disc there on the CT last month. Grossly negative lumbar paraspinal soft tissues. IMPRESSION: 1. Very limited exam, truncated to only sagittal T1 weighted imaging secondary to difficulty with diagnostic lumbar MR image quality due to the presence of Ascites. 2. No lower thoracic or lumbar spinal stenosis, despite chronic L2-L3 and L5-S1 disc degeneration. 3. Heterogeneous marrow signal throughout the visible spine and pelvis which seems to correlate to renal osteodystrophy on prior CTs. No destructive lumbar vertebral lesion. Electronically Signed   By: Genevie Ann M.D.   On: 08/04/2021 12:17   CT ABDOMEN PELVIS W CONTRAST  Result Date: 08/11/2021 CLINICAL DATA:  66 year old male status post abdominal abscess drain placement on 08/06/2021. Follow-up study. EXAM: CT ABDOMEN AND PELVIS WITH CONTRAST TECHNIQUE: Multidetector CT imaging of the abdomen and pelvis was performed using  the standard protocol following bolus administration of intravenous contrast. RADIATION DOSE REDUCTION: This exam was performed according to the departmental dose-optimization program which includes automated exposure control, adjustment of the mA and/or kV according to patient size and/or use of iterative reconstruction technique. CONTRAST:  144mL OMNIPAQUE IOHEXOL 300 MG/ML  SOLN COMPARISON:  07/06/2021 FINDINGS: Lower chest: Global cardiomegaly. Trace bilateral pleural effusions. Bibasilar subsegmental atelectasis. Hepatobiliary: No focal liver abnormality is seen. Status post cholecystectomy. No biliary dilatation. Pancreas: Unremarkable. No pancreatic ductal dilatation or surrounding inflammatory changes. Spleen: Chronic atrophy with calcifications. Adrenals/Urinary Tract: Adrenal glands are  unremarkable. Similar appearing multifocal bilateral simple renal cysts. Kidneys are normal, without renal calculi, focal lesion, or hydronephrosis. Bladder is mostly decompressed. Stomach/Bowel: Stomach is within normal limits. Appendix is not definitively identified. Right lower quadrant entero-enterostomy anastomosis. Scattered areas of mildly dilated small bowel with a few scattered air-fluid levels. Vascular/Lymphatic: Aortic atherosclerosis. No enlarged abdominal or pelvic lymph nodes. Reproductive: Prostate is poorly visualized. Other: Midline lower abdominal indwelling pigtail drain is in unchanged position with interval resolution of the previously visualized gas containing fluid collection. Musculoskeletal: Similar appearing diffuse, heterogeneous sclerotic appearance of the visualized axial skeleton. No acute osseous abnormality. IMPRESSION: 1. Interval resolution of previously visualized lower abdominal abscess. Unchanged position of indwelling drain. 2. Mild distension of scattered areas of small bowel as could be seen with ileus obstruction, though globally decreased from comparison. 3. Similar appearing trace  bilateral pleural effusions and bibasilar subsegmental atelectasis. 4. Similar appearing global cardiomegaly. Ruthann Cancer, MD [facility] Radiology Electronically Signed   By: Ruthann Cancer M.D.   On: 08/11/2021 13:22   CT ABDOMEN PELVIS W CONTRAST  Result Date: 08/04/2021 CLINICAL DATA:  Nausea and vomiting. EXAM: CT ABDOMEN AND PELVIS WITH CONTRAST TECHNIQUE: Multidetector CT imaging of the abdomen and pelvis was performed using the standard protocol following bolus administration of intravenous contrast. RADIATION DOSE REDUCTION: This exam was performed according to the departmental dose-optimization program which includes automated exposure control, adjustment of the mA and/or kV according to patient size and/or use of iterative reconstruction technique. CONTRAST:  142mL OMNIPAQUE IOHEXOL 350 MG/ML SOLN COMPARISON:  Abdominopelvic CT 06/27/2021 FINDINGS: Lower chest: Multi chamber cardiomegaly. Trace left pleural effusion and basilar atelectasis. Patient is leaning to the left. Hepatobiliary: No focal liver lesion. Mild hypertrophy of the left hepatic lobe. No convincing capsular nodularity. Pneumobilia with air in the common bile duct and central intrahepatic biliary tree. Distal common bile duct measures 10 mm. Cholecystectomy. Pancreas: No peripancreatic fat stranding. Prominent proximal pancreatic duct at 4 mm. No obvious pancreatic mass. Spleen: Atrophic and calcified. Adrenals/Urinary Tract: No adrenal nodule. No hydronephrosis. There are multiple bilateral renal cysts. Mild bilateral renal parenchymal atrophy. There is absent renal excretion on delayed phase imaging. Thick-walled urinary bladder. Stomach/Bowel: Detailed bowel assessment is limited in the absence of enteric contrast, paucity of abdominal fat, and presence of abdominopelvic ascites. The stomach is dilated and fluid-filled. Clips are seen within the anterior gastric lumen. Dependent  hyperdensity in the stomach likely ingested pills. The duodenum and proximal small bowel are dilated and fluid-filled there are intermittently areas of decompressed small bowel with wall thickening in the region of small bowel dilatation. Area transition in the left mid abdomen related small bowel thickening. The more distal small bowel is decompressed. There are right lower quadrant chain sutures. Lateral to these chain sutures is a thick-walled fluid collection measuring 9.1 x 5.3 x 4.4 cm, series 3, image 58 and series 6, image 40. This contains non dependent air, with linear air tracking about the right lateral aspect into the right pelvis. This appears new from prior exam. The appendix is present in contains enteric contrast. There is wall thickening of the ascending colon. Remainder of the colon is decompressed and not well assessed on the current exam. Questionable wall thickening involving the proximal sigmoid colon. There is stool distending the rectum. Vascular/Lymphatic: Moderate aortic atherosclerosis. Moderate branch atherosclerosis. Patent portal and splenic veins. No obvious abdominopelvic adenopathy, assessment limited by ascites and paucity of abdominal fat. Reproductive: Unremarkable prostate. Other: Abdominopelvic  ascites, small to moderate in degree, diminished from prior CT. There is a thick-walled fluid collection in the anterior pelvis as described above. Generalized edema of the mesenteric and subcutaneous fat. No pneumoperitoneum. Musculoskeletal: Heterogeneous diffuse osseous sclerosis may represent sequela of renal osteodystrophy or sickle cell. L5-S1 degenerative disc disease. No acute osseous findings are seen. IMPRESSION: 1. Findings suspicious for small-bowel obstruction with transition point in the left mid abdomen, possibly due to adhesions. 2. Thick-walled fluid collection in the anterior pelvis measuring 9.1 x 5.3 x 4.4 cm, suspicious for abscess. This is new from prior exam.  Underlying etiology is indeterminate, this is adjacent to enteric sutures from prior bowel resection. 3. Pneumobilia with air in the common bile duct and central intrahepatic biliary tree, likely related to prior sphincterotomy. Mild dilatation of the distal common bile duct and proximal pancreatic duct without evidence of pancreatic mass. 4. Thick-walled urinary bladder, may be related to chronic renal disease or seen with cystitis. 5. Questionable wall thickening involving the proximal sigmoid colon. 6. Abdominopelvic ascites, small to moderate in degree, diminished from prior CT. 7. Multi chamber cardiomegaly. Trace left pleural effusion and basilar atelectasis. Aortic Atherosclerosis (ICD10-I70.0). Electronically Signed   By: Keith Rake M.D.   On: 08/04/2021 18:58   DG Abd Portable 1V  Result Date: 08/07/2021 CLINICAL DATA:  Small bowel obstruction. EXAM: PORTABLE ABDOMEN - 1 VIEW COMPARISON:  08/04/2021 and CT abdomen 08/04/2021. FINDINGS: Nasogastric tube terminates in the stomach with the side port a few cm beyond the gastroesophageal junction. Placement of a percutaneous drainage catheter in the left anatomic pelvis. Gas is seen in nondilated small bowel and colon. Surgical clips in both upper quadrants. IMPRESSION: 1. Unremarkable bowel gas pattern. 2. Placement of a percutaneous drainage catheter in the left anatomic pelvis. Electronically Signed   By: Lorin Picket M.D.   On: 08/07/2021 08:14   CT IMAGE GUIDED DRAINAGE BY PERCUTANEOUS CATHETER  Result Date: 08/06/2021 INDICATION: 66 year old with bowel obstruction and intra-abdominal abscess. Plan for CT-guided drain placement. Prior abdominal surgery in December 2022. EXAM: CT-guided placement of lower abdominal/pelvic abscess drain MEDICATIONS: Moderate sedation ANESTHESIA/SEDATION: Moderate (conscious) sedation was employed during this procedure. A total of Versed 2.0mg  and fentanyl 100 mcg was administered intravenously at the order of  the provider performing the procedure. Total intra-service moderate sedation time: 34 minutes. Patient's level of consciousness and vital signs were monitored continuously by radiology nurse throughout the procedure under the supervision of the provider performing the procedure. COMPLICATIONS: None immediate. PROCEDURE: Informed written consent was obtained from the patient after a thorough discussion of the procedural risks, benefits and alternatives. All questions were addressed.A timeout was performed prior to the initiation of the procedure. Patient was placed supine on CT scanner. Images through the lower abdomen and pelvis were obtained. The anterior fluid collection was identified and targeted. The anterior abdomen was prepped with chlorhexidine and sterile field was created. Maximal barrier sterile technique was utilized including caps, mask, sterile gowns, sterile gloves, sterile drape, hand hygiene and skin antiseptic. Skin was anesthetized using 1% lidocaine. Small incision was made. Using CT guidance, an 18 gauge trocar needle was directed into the anterior fluid collection. Gray purulent fluid was aspirated. Superstiff Amplatz wire was advanced into the collection and the tract was dilated to accommodate a 10 [place] multipurpose drain. 30 mL of fluid was removed. Drain was flushed with saline and attached to a suction bulb. Drain was sutured to skin. Dressing was placed. FINDINGS: Thick wall fluid collection containing  a small amount of gas in the anterior lower abdomen and upper pelvis. 10 French drain was placed and 30 mL of gray purulent-looking fluid was removed. IMPRESSION: CT-guided placement of a drain in the lower abdominal/pelvic abscess. Electronically Signed   By: Markus Daft M.D.   On: 08/06/2021 12:29     TODAY-DAY OF DISCHARGE:  Subjective:   Patrick [name] today has no headache,no chest abdominal pain,no new weakness tingling or numbness, feels much better wants to go home today.    Objective:   Blood pressure 105/65, pulse 76, temperature 97.8 F (36.6 C), temperature source Oral, resp. rate 16, height 5\' 11"  (1.803 m), weight 52.6 kg, SpO2 100 %.  Intake/Output Summary (Last 24 hours) at 08/11/2021 1354 Last data filed at 08/11/2021 1108 Gross per 24 hour  Intake 390 ml  Output 552 ml  Net -162 ml   Filed Weights   08/10/21 1216 08/10/21 1530 08/11/21 0500  Weight: 55.9 kg 52.6 kg 52.6 kg    Exam: Awake Alert, Oriented *3, No new F.N deficits, Normal affect [place].AT,PERRAL Supple Neck,No JVD, No cervical lymphadenopathy appriciated.  Symmetrical Chest wall movement, Good air movement bilaterally, CTAB RRR,No Gallops,Rubs or new Murmurs, No Parasternal Heave +ve B.Sounds, Abd Soft, Non tender, No organomegaly appriciated, No rebound -guarding or rigidity. No Cyanosis, Clubbing or edema, No new Rash or bruise   PERTINENT RADIOLOGIC STUDIES: CT ABDOMEN PELVIS W CONTRAST  Result Date: 08/11/2021 CLINICAL DATA:  66 year old male status post abdominal abscess drain placement on 08/06/2021. Follow-up study. EXAM: CT ABDOMEN AND PELVIS WITH CONTRAST TECHNIQUE: Multidetector CT imaging of the abdomen and pelvis was performed using the standard protocol following bolus administration of intravenous contrast. RADIATION DOSE REDUCTION: This exam was performed according to the departmental dose-optimization program which includes automated exposure control, adjustment of the mA and/or kV according to patient size and/or use of iterative reconstruction technique. CONTRAST:  115mL OMNIPAQUE IOHEXOL 300 MG/ML  SOLN COMPARISON:  07/06/2021 FINDINGS: Lower chest: Global cardiomegaly. Trace bilateral pleural effusions. Bibasilar subsegmental atelectasis. Hepatobiliary: No focal liver abnormality is seen. Status post cholecystectomy. No biliary dilatation. Pancreas: Unremarkable. No pancreatic ductal dilatation or surrounding inflammatory changes. Spleen: Chronic atrophy with  calcifications. Adrenals/Urinary Tract: Adrenal glands are unremarkable. Similar appearing multifocal bilateral simple renal cysts. Kidneys are normal, without renal calculi, focal lesion, or hydronephrosis. Bladder is mostly decompressed. Stomach/Bowel: Stomach is within normal limits. Appendix is not definitively identified. Right lower quadrant entero-enterostomy anastomosis. Scattered areas of mildly dilated small bowel with a few scattered air-fluid levels. Vascular/Lymphatic: Aortic atherosclerosis. No enlarged abdominal or pelvic lymph nodes. Reproductive: Prostate is poorly visualized. Other: Midline lower abdominal indwelling pigtail drain is in unchanged position with interval resolution of the previously visualized gas containing fluid collection. Musculoskeletal: Similar appearing diffuse, heterogeneous sclerotic appearance of the visualized axial skeleton. No acute osseous abnormality. IMPRESSION: 1. Interval resolution of previously visualized lower abdominal abscess. Unchanged position of indwelling drain. 2. Mild distension of scattered areas of small bowel as could be seen with ileus obstruction, though globally decreased from comparison. 3. Similar appearing trace bilateral pleural effusions and bibasilar subsegmental atelectasis. 4. Similar appearing global cardiomegaly. Ruthann Cancer, MD [facility] Electronically Signed   By: Ruthann Cancer M.D.   On: 08/11/2021 13:22     PERTINENT LAB RESULTS: CBC: Recent Labs    08/10/21 0344 08/11/21 0830  WBC 4.9 6.5  HGB 7.7* 9.0*  HCT 21.7* 26.5*  PLT 146* 151   CMET CMP  Component Value Date/Time   NA 132 (L) 08/10/2021 1034   NA 135 05/24/2020 1119   K 3.3 (L) 08/10/2021 1034   CL 96 (L) 08/10/2021 1034   CO2 26 08/10/2021 1034   GLUCOSE 98 08/10/2021 1034   BUN 20 08/10/2021 1034   BUN 25 05/24/2020 1119   CREATININE 4.17 (H) 08/10/2021 1034   CREATININE 2.90 (H)  08/11/2015 1359   CALCIUM 8.3 (L) 08/10/2021 1034   PROT 5.9 (L) 08/04/2021 0241   PROT 7.5 05/24/2020 1119   ALBUMIN 1.6 (L) 08/10/2021 1034   ALBUMIN 4.1 05/24/2020 1119   AST 10 (L) 08/04/2021 0241   ALT 9 08/04/2021 0241   ALKPHOS 180 (H) 08/04/2021 0241   BILITOT 1.5 (H) 08/04/2021 0241   BILITOT 2.4 (H) 05/24/2020 1119   GFRNONAA 15 (L) 08/10/2021 1034   GFRNONAA 23 (L) 08/11/2015 1359   GFRAA 24 (L) 05/24/2020 1119   GFRAA 27 (L) 08/11/2015 1359    GFR Estimated Creatinine Clearance: 13.5 mL/min (A) (by C-G formula based on SCr of 4.17 mg/dL (H)). No results for input(s): LIPASE, AMYLASE in the last 72 hours. No results for input(s): CKTOTAL, CKMB, CKMBINDEX, TROPONINI in the last 72 hours. Invalid input(s): POCBNP No results for input(s): DDIMER in the last 72 hours. No results for input(s): HGBA1C in the last 72 hours. No results for input(s): CHOL, HDL, LDLCALC, TRIG, CHOLHDL, LDLDIRECT in the last 72 hours. No results for input(s): TSH, T4TOTAL, T3FREE, THYROIDAB in the last 72 hours.  Invalid input(s): FREET3 No results for input(s): VITAMINB12, FOLATE, FERRITIN, TIBC, IRON, RETICCTPCT in the last 72 hours. Coags: No results for input(s): INR in the last 72 hours.  Invalid input(s): PT Microbiology: Recent Results (from the past 240 hour(s))  Aerobic/Anaerobic Culture w Gram Stain (surgical/deep wound)     Status: None   Collection Time: 08/06/21 11:10 AM   Specimen: Abscess  Result Value Ref Range Status   Specimen Description ABSCESS  Final   Special Requests NONE  Final   Gram Stain   Final    ABUNDANT WBC PRESENT, PREDOMINANTLY PMN ABUNDANT GRAM NEGATIVE RODS RARE GRAM POSITIVE COCCI    Culture   Final    FEW ESCHERICHIA COLI ABUNDANT BACTEROIDES FRAGILIS BETA LACTAMASE POSITIVE Performed at [facility], [address] [place], [place] [address]    Report Status 08/09/2021 FINAL  Final   Organism ID, Bacteria ESCHERICHIA COLI  Final       Susceptibility   Escherichia coli - MIC*    AMPICILLIN <=2 SENSITIVE Sensitive     CEFAZOLIN <=4 SENSITIVE Sensitive     CEFEPIME <=0.12 SENSITIVE Sensitive     CEFTAZIDIME <=1 SENSITIVE Sensitive     CEFTRIAXONE <=0.25 SENSITIVE Sensitive     CIPROFLOXACIN <=0.25 SENSITIVE Sensitive     GENTAMICIN <=1 SENSITIVE Sensitive     IMIPENEM <=0.25 SENSITIVE Sensitive     TRIMETH/SULFA <=20 SENSITIVE Sensitive     AMPICILLIN/SULBACTAM <=2 SENSITIVE Sensitive     PIP/TAZO <=4 SENSITIVE Sensitive     * FEW ESCHERICHIA COLI    FURTHER DISCHARGE INSTRUCTIONS:  Get Medicines reviewed and adjusted: Please take all your medications with you for your next visit with your Primary MD  Laboratory/radiological data: Please request your Primary MD to go over all hospital tests and procedure/radiological results at the follow up, please ask your Primary MD to get all Hospital records sent to his/her office.  In some cases, they will be blood work, cultures and  biopsy results pending at the time of your discharge. Please request that your primary care M.D. goes through all the records of your hospital data and follows up on these results.  Also Note the following: If you experience worsening of your admission symptoms, develop shortness of breath, life threatening emergency, suicidal or homicidal thoughts you must seek medical attention immediately by calling 911 or calling your MD immediately  if symptoms less severe.  You must read complete instructions/literature along with all the possible adverse reactions/side effects for all the Medicines you take and that have been prescribed to you. Take any new Medicines after you have completely understood and accpet all the possible adverse reactions/side effects.   Do not drive when taking Pain medications or sleeping medications (Benzodaizepines)  Do not take more than prescribed Pain, Sleep and Anxiety Medications. It is not advisable to combine  anxiety,sleep and pain medications without talking with your primary care practitioner  Special Instructions: If you have smoked or chewed Tobacco  in the last 2 yrs please stop smoking, stop any regular Alcohol  and or any Recreational drug use.  Wear Seat belts while driving.  Please note: You were cared for by a hospitalist during your hospital stay. Once you are discharged, your primary care physician will handle any further medical issues. Please note that NO REFILLS for any discharge medications will be authorized once you are discharged, as it is imperative that you return to your primary care physician (or establish a relationship with a primary care physician if you do not have one) for your post hospital discharge needs so that they can reassess your need for medications and monitor your lab values.  Total Time spent coordinating discharge including counseling, education and face to face time equals greater than 30 minutes.  SignedOren Binet 08/11/2021 1:54 PM

## 2021-08-13 ENCOUNTER — Encounter (HOSPITAL_COMMUNITY): Payer: Self-pay | Admitting: Gastroenterology

## 2021-08-13 ENCOUNTER — Telehealth: Payer: Self-pay | Admitting: Nephrology

## 2021-08-13 NOTE — Telephone Encounter (Signed)
Transition of Care Contact from [place]   Date of Discharge: 08/11/21 Date of Contact: 08/13/21 Method of contact: phone Talked to patient   Patient contacted to discuss transition of care form recent hospitaliztion. Patient was admitted to [facility] from 07/31/21 to 08/11/21 with the discharge diagnosis of ABLA w/bleeding AVM s/p APC and SBO w/pelvic abscess s/p drain.    Medication changes were reviewed - carvedilol listed on med list but not taking d/t hypotension.  Will reassess if needed at HD.   Patient will follow up with is outpatient dialysis center 08/15/21.  Other follow up needs include none identified.   Jen Mow, PA-C [facility] Pager: [phone]

## 2021-08-15 DIAGNOSIS — D57819 Other sickle-cell disorders with crisis, unspecified: Secondary | ICD-10-CM | POA: Diagnosis not present

## 2021-08-15 DIAGNOSIS — Z8719 Personal history of other diseases of the digestive system: Secondary | ICD-10-CM | POA: Diagnosis not present

## 2021-08-15 DIAGNOSIS — I158 Other secondary hypertension: Secondary | ICD-10-CM | POA: Diagnosis not present

## 2021-08-15 DIAGNOSIS — N2581 Secondary hyperparathyroidism of renal origin: Secondary | ICD-10-CM | POA: Diagnosis not present

## 2021-08-15 DIAGNOSIS — D571 Sickle-cell disease without crisis: Secondary | ICD-10-CM | POA: Diagnosis not present

## 2021-08-15 DIAGNOSIS — Z79891 Long term (current) use of opiate analgesic: Secondary | ICD-10-CM | POA: Diagnosis not present

## 2021-08-15 DIAGNOSIS — D631 Anemia in chronic kidney disease: Secondary | ICD-10-CM | POA: Diagnosis not present

## 2021-08-15 DIAGNOSIS — N186 End stage renal disease: Secondary | ICD-10-CM | POA: Diagnosis not present

## 2021-08-15 DIAGNOSIS — D57219 Sickle-cell/Hb-C disease with crisis, unspecified: Secondary | ICD-10-CM | POA: Diagnosis not present

## 2021-08-15 DIAGNOSIS — I509 Heart failure, unspecified: Secondary | ICD-10-CM | POA: Diagnosis not present

## 2021-08-15 DIAGNOSIS — Z992 Dependence on renal dialysis: Secondary | ICD-10-CM | POA: Diagnosis not present

## 2021-08-15 DIAGNOSIS — D509 Iron deficiency anemia, unspecified: Secondary | ICD-10-CM | POA: Diagnosis not present

## 2021-08-16 DIAGNOSIS — D500 Iron deficiency anemia secondary to blood loss (chronic): Secondary | ICD-10-CM | POA: Diagnosis not present

## 2021-08-16 DIAGNOSIS — I4821 Permanent atrial fibrillation: Secondary | ICD-10-CM | POA: Diagnosis not present

## 2021-08-16 DIAGNOSIS — Z7901 Long term (current) use of anticoagulants: Secondary | ICD-10-CM | POA: Diagnosis not present

## 2021-08-16 DIAGNOSIS — N186 End stage renal disease: Secondary | ICD-10-CM | POA: Diagnosis not present

## 2021-08-16 DIAGNOSIS — K2100 Gastro-esophageal reflux disease with esophagitis, without bleeding: Secondary | ICD-10-CM | POA: Diagnosis not present

## 2021-08-16 DIAGNOSIS — I272 Pulmonary hypertension, unspecified: Secondary | ICD-10-CM | POA: Diagnosis not present

## 2021-08-16 DIAGNOSIS — Z79891 Long term (current) use of opiate analgesic: Secondary | ICD-10-CM | POA: Diagnosis not present

## 2021-08-16 DIAGNOSIS — Z48815 Encounter for surgical aftercare following surgery on the digestive system: Secondary | ICD-10-CM | POA: Diagnosis not present

## 2021-08-16 DIAGNOSIS — Z992 Dependence on renal dialysis: Secondary | ICD-10-CM | POA: Diagnosis not present

## 2021-08-16 DIAGNOSIS — M109 Gout, unspecified: Secondary | ICD-10-CM | POA: Diagnosis not present

## 2021-08-16 DIAGNOSIS — I132 Hypertensive heart and chronic kidney disease with heart failure and with stage 5 chronic kidney disease, or end stage renal disease: Secondary | ICD-10-CM | POA: Diagnosis not present

## 2021-08-16 DIAGNOSIS — I5022 Chronic systolic (congestive) heart failure: Secondary | ICD-10-CM | POA: Diagnosis not present

## 2021-08-16 DIAGNOSIS — Z87891 Personal history of nicotine dependence: Secondary | ICD-10-CM | POA: Diagnosis not present

## 2021-08-16 DIAGNOSIS — H548 Legal blindness, as defined in USA: Secondary | ICD-10-CM | POA: Diagnosis not present

## 2021-08-16 DIAGNOSIS — J449 Chronic obstructive pulmonary disease, unspecified: Secondary | ICD-10-CM | POA: Diagnosis not present

## 2021-08-16 DIAGNOSIS — D571 Sickle-cell disease without crisis: Secondary | ICD-10-CM | POA: Diagnosis not present

## 2021-08-16 DIAGNOSIS — Z4801 Encounter for change or removal of surgical wound dressing: Secondary | ICD-10-CM | POA: Diagnosis not present

## 2021-08-19 ENCOUNTER — Other Ambulatory Visit: Payer: Self-pay | Admitting: Nurse Practitioner

## 2021-08-19 DIAGNOSIS — D631 Anemia in chronic kidney disease: Secondary | ICD-10-CM | POA: Diagnosis not present

## 2021-08-19 DIAGNOSIS — Z992 Dependence on renal dialysis: Secondary | ICD-10-CM | POA: Diagnosis not present

## 2021-08-19 DIAGNOSIS — D57219 Sickle-cell/Hb-C disease with crisis, unspecified: Secondary | ICD-10-CM | POA: Diagnosis not present

## 2021-08-19 DIAGNOSIS — D571 Sickle-cell disease without crisis: Secondary | ICD-10-CM | POA: Diagnosis not present

## 2021-08-19 DIAGNOSIS — N186 End stage renal disease: Secondary | ICD-10-CM | POA: Diagnosis not present

## 2021-08-19 DIAGNOSIS — N2581 Secondary hyperparathyroidism of renal origin: Secondary | ICD-10-CM | POA: Diagnosis not present

## 2021-08-19 DIAGNOSIS — D57819 Other sickle-cell disorders with crisis, unspecified: Secondary | ICD-10-CM | POA: Diagnosis not present

## 2021-08-22 ENCOUNTER — Encounter (HOSPITAL_COMMUNITY): Payer: Self-pay | Admitting: Radiology

## 2021-08-22 ENCOUNTER — Ambulatory Visit: Payer: HMO | Admitting: Family Medicine

## 2021-08-22 ENCOUNTER — Inpatient Hospital Stay (HOSPITAL_COMMUNITY): Payer: HMO

## 2021-08-22 ENCOUNTER — Inpatient Hospital Stay (HOSPITAL_COMMUNITY)
Admission: EM | Admit: 2021-08-22 | Discharge: 2021-08-30 | DRG: 388 | Disposition: A | Discharge status: Home / Self Care | Payer: HMO | Attending: Internal Medicine | Admitting: Internal Medicine

## 2021-08-22 ENCOUNTER — Emergency Department (HOSPITAL_COMMUNITY): Payer: HMO

## 2021-08-22 DIAGNOSIS — N2581 Secondary hyperparathyroidism of renal origin: Secondary | ICD-10-CM | POA: Diagnosis not present

## 2021-08-22 DIAGNOSIS — J90 Pleural effusion, not elsewhere classified: Secondary | ICD-10-CM | POA: Diagnosis not present

## 2021-08-22 DIAGNOSIS — R1084 Generalized abdominal pain: Secondary | ICD-10-CM | POA: Diagnosis not present

## 2021-08-22 DIAGNOSIS — K56609 Unspecified intestinal obstruction, unspecified as to partial versus complete obstruction: Secondary | ICD-10-CM | POA: Diagnosis present

## 2021-08-22 DIAGNOSIS — K56600 Partial intestinal obstruction, unspecified as to cause: Secondary | ICD-10-CM | POA: Diagnosis not present

## 2021-08-22 DIAGNOSIS — D509 Iron deficiency anemia, unspecified: Secondary | ICD-10-CM | POA: Diagnosis not present

## 2021-08-22 DIAGNOSIS — Z9049 Acquired absence of other specified parts of digestive tract: Secondary | ICD-10-CM

## 2021-08-22 DIAGNOSIS — D57819 Other sickle-cell disorders with crisis, unspecified: Secondary | ICD-10-CM | POA: Diagnosis not present

## 2021-08-22 DIAGNOSIS — E8770 Fluid overload, unspecified: Secondary | ICD-10-CM

## 2021-08-22 DIAGNOSIS — I120 Hypertensive chronic kidney disease with stage 5 chronic kidney disease or end stage renal disease: Secondary | ICD-10-CM | POA: Diagnosis not present

## 2021-08-22 DIAGNOSIS — E871 Hypo-osmolality and hyponatremia: Secondary | ICD-10-CM | POA: Diagnosis not present

## 2021-08-22 DIAGNOSIS — Z66 Do not resuscitate: Secondary | ICD-10-CM | POA: Diagnosis present

## 2021-08-22 DIAGNOSIS — I5042 Chronic combined systolic (congestive) and diastolic (congestive) heart failure: Secondary | ICD-10-CM | POA: Diagnosis not present

## 2021-08-22 DIAGNOSIS — R188 Other ascites: Secondary | ICD-10-CM | POA: Diagnosis not present

## 2021-08-22 DIAGNOSIS — Z992 Dependence on renal dialysis: Secondary | ICD-10-CM | POA: Diagnosis not present

## 2021-08-22 DIAGNOSIS — N281 Cyst of kidney, acquired: Secondary | ICD-10-CM | POA: Diagnosis not present

## 2021-08-22 DIAGNOSIS — K5521 Angiodysplasia of colon with hemorrhage: Secondary | ICD-10-CM | POA: Diagnosis present

## 2021-08-22 DIAGNOSIS — D631 Anemia in chronic kidney disease: Secondary | ICD-10-CM | POA: Diagnosis not present

## 2021-08-22 DIAGNOSIS — K559 Vascular disorder of intestine, unspecified: Secondary | ICD-10-CM | POA: Diagnosis present

## 2021-08-22 DIAGNOSIS — Z0189 Encounter for other specified special examinations: Secondary | ICD-10-CM

## 2021-08-22 DIAGNOSIS — I700 Atherosclerosis of aorta: Secondary | ICD-10-CM | POA: Diagnosis not present

## 2021-08-22 DIAGNOSIS — J449 Chronic obstructive pulmonary disease, unspecified: Secondary | ICD-10-CM | POA: Diagnosis not present

## 2021-08-22 DIAGNOSIS — Z8249 Family history of ischemic heart disease and other diseases of the circulatory system: Secondary | ICD-10-CM

## 2021-08-22 DIAGNOSIS — K6389 Other specified diseases of intestine: Secondary | ICD-10-CM | POA: Diagnosis not present

## 2021-08-22 DIAGNOSIS — I4821 Permanent atrial fibrillation: Secondary | ICD-10-CM | POA: Diagnosis present

## 2021-08-22 DIAGNOSIS — R109 Unspecified abdominal pain: Secondary | ICD-10-CM | POA: Diagnosis not present

## 2021-08-22 DIAGNOSIS — Z20822 Contact with and (suspected) exposure to covid-19: Secondary | ICD-10-CM | POA: Diagnosis not present

## 2021-08-22 DIAGNOSIS — Z681 Body mass index (BMI) 19 or less, adult: Secondary | ICD-10-CM

## 2021-08-22 DIAGNOSIS — Z811 Family history of alcohol abuse and dependence: Secondary | ICD-10-CM | POA: Diagnosis not present

## 2021-08-22 DIAGNOSIS — E43 Unspecified severe protein-calorie malnutrition: Secondary | ICD-10-CM | POA: Diagnosis not present

## 2021-08-22 DIAGNOSIS — D649 Anemia, unspecified: Secondary | ICD-10-CM | POA: Diagnosis present

## 2021-08-22 DIAGNOSIS — N186 End stage renal disease: Secondary | ICD-10-CM | POA: Diagnosis not present

## 2021-08-22 DIAGNOSIS — I132 Hypertensive heart and chronic kidney disease with heart failure and with stage 5 chronic kidney disease, or end stage renal disease: Secondary | ICD-10-CM | POA: Diagnosis not present

## 2021-08-22 DIAGNOSIS — H548 Legal blindness, as defined in USA: Secondary | ICD-10-CM | POA: Diagnosis not present

## 2021-08-22 DIAGNOSIS — Z79899 Other long term (current) drug therapy: Secondary | ICD-10-CM

## 2021-08-22 DIAGNOSIS — D57219 Sickle-cell/Hb-C disease with crisis, unspecified: Secondary | ICD-10-CM | POA: Diagnosis not present

## 2021-08-22 DIAGNOSIS — K5669 Other partial intestinal obstruction: Secondary | ICD-10-CM | POA: Diagnosis not present

## 2021-08-22 DIAGNOSIS — Z87891 Personal history of nicotine dependence: Secondary | ICD-10-CM

## 2021-08-22 DIAGNOSIS — T68XXXA Hypothermia, initial encounter: Secondary | ICD-10-CM | POA: Diagnosis not present

## 2021-08-22 DIAGNOSIS — Z4659 Encounter for fitting and adjustment of other gastrointestinal appliance and device: Secondary | ICD-10-CM | POA: Diagnosis not present

## 2021-08-22 DIAGNOSIS — K5989 Other specified functional intestinal disorders: Secondary | ICD-10-CM | POA: Diagnosis not present

## 2021-08-22 DIAGNOSIS — H547 Unspecified visual loss: Secondary | ICD-10-CM | POA: Diagnosis not present

## 2021-08-22 DIAGNOSIS — M109 Gout, unspecified: Secondary | ICD-10-CM | POA: Diagnosis present

## 2021-08-22 DIAGNOSIS — D72819 Decreased white blood cell count, unspecified: Secondary | ICD-10-CM | POA: Diagnosis not present

## 2021-08-22 DIAGNOSIS — D759 Disease of blood and blood-forming organs, unspecified: Secondary | ICD-10-CM | POA: Diagnosis present

## 2021-08-22 DIAGNOSIS — R0902 Hypoxemia: Secondary | ICD-10-CM | POA: Diagnosis not present

## 2021-08-22 DIAGNOSIS — E440 Moderate protein-calorie malnutrition: Secondary | ICD-10-CM | POA: Diagnosis present

## 2021-08-22 DIAGNOSIS — R9431 Abnormal electrocardiogram [ECG] [EKG]: Secondary | ICD-10-CM | POA: Diagnosis present

## 2021-08-22 DIAGNOSIS — I517 Cardiomegaly: Secondary | ICD-10-CM | POA: Diagnosis not present

## 2021-08-22 DIAGNOSIS — K567 Ileus, unspecified: Secondary | ICD-10-CM

## 2021-08-22 DIAGNOSIS — I081 Rheumatic disorders of both mitral and tricuspid valves: Secondary | ICD-10-CM | POA: Diagnosis present

## 2021-08-22 DIAGNOSIS — R54 Age-related physical debility: Secondary | ICD-10-CM | POA: Diagnosis present

## 2021-08-22 DIAGNOSIS — D571 Sickle-cell disease without crisis: Secondary | ICD-10-CM | POA: Diagnosis not present

## 2021-08-22 LAB — COMPREHENSIVE METABOLIC PANEL WITH GFR
ALT: 22 U/L (ref 0–44)
AST: 38 U/L (ref 15–41)
Albumin: 2.4 g/dL — ABNORMAL LOW (ref 3.5–5.0)
Alkaline Phosphatase: 173 U/L — ABNORMAL HIGH (ref 38–126)
Anion gap: 14 (ref 5–15)
BUN: 21 mg/dL (ref 8–23)
CO2: 27 mmol/L (ref 22–32)
Calcium: 8.5 mg/dL — ABNORMAL LOW (ref 8.9–10.3)
Chloride: 91 mmol/L — ABNORMAL LOW (ref 98–111)
Creatinine, Ser: 3.08 mg/dL — ABNORMAL HIGH (ref 0.61–1.24)
GFR, Estimated: 22 mL/min — ABNORMAL LOW
Glucose, Bld: 83 mg/dL (ref 70–99)
Potassium: 3.9 mmol/L (ref 3.5–5.1)
Sodium: 132 mmol/L — ABNORMAL LOW (ref 135–145)
Total Bilirubin: 1.6 mg/dL — ABNORMAL HIGH (ref 0.3–1.2)
Total Protein: 7.7 g/dL (ref 6.5–8.1)

## 2021-08-22 LAB — CBC WITH DIFFERENTIAL/PLATELET
Abs Immature Granulocytes: 0.02 K/uL (ref 0.00–0.07)
Basophils Absolute: 0.1 K/uL (ref 0.0–0.1)
Basophils Relative: 2 %
Eosinophils Absolute: 0 K/uL (ref 0.0–0.5)
Eosinophils Relative: 0 %
HCT: 26.9 % — ABNORMAL LOW (ref 39.0–52.0)
Hemoglobin: 9.4 g/dL — ABNORMAL LOW (ref 13.0–17.0)
Immature Granulocytes: 1 %
Lymphocytes Relative: 10 %
Lymphs Abs: 0.4 K/uL — ABNORMAL LOW (ref 0.7–4.0)
MCH: 35.7 pg — ABNORMAL HIGH (ref 26.0–34.0)
MCHC: 34.9 g/dL (ref 30.0–36.0)
MCV: 102.3 fL — ABNORMAL HIGH (ref 80.0–100.0)
Monocytes Absolute: 0.8 K/uL (ref 0.1–1.0)
Monocytes Relative: 21 %
Neutro Abs: 2.4 K/uL (ref 1.7–7.7)
Neutrophils Relative %: 66 %
Platelets: 189 K/uL (ref 150–400)
RBC: 2.63 MIL/uL — ABNORMAL LOW (ref 4.22–5.81)
RDW: 21.8 % — ABNORMAL HIGH (ref 11.5–15.5)
WBC: 3.6 K/uL — ABNORMAL LOW (ref 4.0–10.5)
nRBC: 5.3 % — ABNORMAL HIGH (ref 0.0–0.2)

## 2021-08-22 LAB — RESP PANEL BY RT-PCR (FLU A&B, COVID) ARPGX2
Influenza A by PCR: NEGATIVE
Influenza B by PCR: NEGATIVE
SARS Coronavirus 2 by RT PCR: NEGATIVE

## 2021-08-22 LAB — TYPE AND SCREEN
ABO/RH(D): AB POS
Antibody Screen: NEGATIVE

## 2021-08-22 LAB — LACTIC ACID, PLASMA: Lactic Acid, Venous: 1.5 mmol/L (ref 0.5–1.9)

## 2021-08-22 LAB — LIPASE, BLOOD: Lipase: 29 U/L (ref 11–51)

## 2021-08-22 LAB — CREATININE, SERUM
Creatinine, Ser: 3.53 mg/dL — ABNORMAL HIGH (ref 0.61–1.24)
GFR, Estimated: 19 mL/min — ABNORMAL LOW (ref >60)

## 2021-08-22 MED ORDER — PIPERACILLIN-TAZOBACTAM IN DEX 2-0.25 GM/50ML IV SOLN
2.2500 g | Freq: Three times a day (TID) | INTRAVENOUS | Status: DC
  Administered 2021-08-22 – 2021-08-30 (×21): 2.25 g via INTRAVENOUS
  Filled 2021-08-22 (×25): qty 50

## 2021-08-22 MED ORDER — MORPHINE SULFATE (PF) 2 MG/ML IV SOLN
2.0000 mg | Freq: Once | INTRAVENOUS | Status: AC
  Administered 2021-08-22: 2 mg via INTRAVENOUS
  Filled 2021-08-22: qty 1

## 2021-08-22 MED ORDER — PROCHLORPERAZINE EDISYLATE 10 MG/2ML IJ SOLN
5.0000 mg | Freq: Four times a day (QID) | INTRAMUSCULAR | Status: DC | PRN
  Administered 2021-08-27: 5 mg via INTRAVENOUS
  Filled 2021-08-22: qty 2

## 2021-08-22 MED ORDER — MORPHINE SULFATE (PF) 4 MG/ML IV SOLN
3.0000 mg | Freq: Once | INTRAVENOUS | Status: AC
Start: 2021-08-22 — End: 2021-08-22
  Administered 2021-08-22: 3 mg via INTRAVENOUS
  Filled 2021-08-22: qty 1

## 2021-08-22 MED ORDER — SODIUM CHLORIDE (PF) 0.9 % IJ SOLN
INTRAMUSCULAR | Status: AC
  Filled 2021-08-22: qty 50

## 2021-08-22 MED ORDER — IOHEXOL 350 MG/ML SOLN
100.0000 mL | Freq: Once | INTRAVENOUS | Status: AC | PRN
  Administered 2021-08-22: 100 mL via INTRAVENOUS

## 2021-08-22 MED ORDER — HYDROMORPHONE HCL 1 MG/ML IJ SOLN
0.5000 mg | INTRAMUSCULAR | Status: DC | PRN
  Administered 2021-08-22 – 2021-08-24 (×7): 0.5 mg via INTRAVENOUS
  Filled 2021-08-22 (×5): qty 0.5
  Filled 2021-08-22: qty 1
  Filled 2021-08-22: qty 0.5

## 2021-08-22 MED ORDER — LORAZEPAM 2 MG/ML IJ SOLN
0.2500 mg | Freq: Once | INTRAMUSCULAR | Status: AC
  Administered 2021-08-22: 0.25 mg via INTRAVENOUS
  Filled 2021-08-22: qty 1

## 2021-08-22 MED ORDER — OXYCODONE HCL 5 MG PO TABS
5.0000 mg | ORAL_TABLET | Freq: Once | ORAL | Status: AC
  Administered 2021-08-22: 5 mg via ORAL
  Filled 2021-08-22: qty 1

## 2021-08-22 MED ORDER — PANTOPRAZOLE SODIUM 40 MG IV SOLR
40.0000 mg | Freq: Every day | INTRAVENOUS | Status: DC
  Administered 2021-08-22 – 2021-08-27 (×6): 40 mg via INTRAVENOUS
  Filled 2021-08-22 (×6): qty 10

## 2021-08-22 MED ORDER — HEPARIN SODIUM (PORCINE) 5000 UNIT/ML IJ SOLN
5000.0000 [IU] | Freq: Three times a day (TID) | INTRAMUSCULAR | Status: DC
  Filled 2021-08-22 (×6): qty 1

## 2021-08-22 MED ORDER — DEXTROSE-NACL 5-0.45 % IV SOLN: INTRAVENOUS | Status: DC

## 2021-08-22 MED ORDER — MORPHINE SULFATE (PF) 2 MG/ML IV SOLN: 2.0000 mg | Freq: Once | INTRAVENOUS | Status: DC

## 2021-08-22 MED ORDER — METOPROLOL TARTRATE 5 MG/5ML IV SOLN: 2.5000 mg | Freq: Four times a day (QID) | INTRAVENOUS | Status: DC | PRN

## 2021-08-22 MED ORDER — MORPHINE SULFATE (PF) 4 MG/ML IV SOLN
3.0000 mg | Freq: Once | INTRAVENOUS | Status: AC
  Administered 2021-08-22: 3 mg via INTRAVENOUS
  Filled 2021-08-22: qty 1

## 2021-08-22 MED ORDER — METOPROLOL TARTRATE 5 MG/5ML IV SOLN
2.5000 mg | Freq: Four times a day (QID) | INTRAVENOUS | Status: DC | PRN
Start: 2021-08-22 — End: 2021-08-30

## 2021-08-22 MED ORDER — DIATRIZOATE MEGLUMINE & SODIUM 66-10 % PO SOLN
90.0000 mL | Freq: Once | ORAL | Status: AC
  Administered 2021-08-22: 90 mL via NASOGASTRIC
  Filled 2021-08-22: qty 90

## 2021-08-22 NOTE — Assessment & Plan Note (Signed)
Minimize or avoid QT prolonging drugs ?

## 2021-08-22 NOTE — Assessment & Plan Note (Signed)
No cardiopulmonary symptoms.  He has chronic BLE edema.  Still makes some urine. ?-Fluid management by dialysis ?

## 2021-08-22 NOTE — Assessment & Plan Note (Signed)
Does not seem to be medication.  No respiratory issues. ?

## 2021-08-22 NOTE — ED Notes (Signed)
Pt refused rectal temp. Oral temp reads 94.4. Provided pt with multiple warm blankets.  ?

## 2021-08-22 NOTE — Hospital Course (Signed)
66 year old M with PMH of ESRD on HD TTS, sickle cell disease, combined CHF, permanent A-fib, upper GI bleed due to AVMs, laparotomy/cholecystectomy/ischemic small bowel resection in 12/22 and recent hospitalization from SBO that has resolved with NGT and pelvic abscess treated with drain placement and antibiotics presenting with "severe" abdominal pain for 1 day.   ? ?Patient reports progressive abdominal pain today.  He was able to go to dialysis and complete the whole session.  He describes the pain as severe but not able to characterize.  He states his pain is at his surgical wound.  No radiation.  He denies drainage from surgical site.  No aggravating or elevating factor.  Denies fall or trauma.  He is also out of his oxycodone.  He says he had an appointment at his sickle cell clinic but could not make it due to pain.  He has no nausea or vomiting.  Denies fever, URI symptoms, chest pain or dyspnea.  Reports normal bowel movement about 4 AM this morning.  He denies melena or hematochezia.  Reports making some urine. ? ?Patient reports he ambulates independently.  Lives alone.  Denies smoking cigarette, drinking alcohol or recreational drug use.  He says he is DNR. ? ?In ED, hypothermic to 94.4 but improved to 97.2.  Other vitals stable. CMP and CBC stable other than mild leukopenia to 3.6.  Lactic acid 1.5.  COVID-19 and influenza PCR nonreactive.  CT abdomen and pelvis concerning for SBO with transition site in RLQ just distal to stool like material,, residual fluid collection in anterior lower abdomen/pelvis measuring 4.5 cm and a small bilateral pleural effusion.  General surgery consulted.  Per EDP, patient refused NG tube although he later agreed when I talked to him. ?

## 2021-08-22 NOTE — Assessment & Plan Note (Signed)
Very frail with significant muscle mass and subcu fat loss.  ?-Consult dietitian once he started taking p.o. ?

## 2021-08-22 NOTE — ED Provider Notes (Signed)
[facility] DEPT Provider Note   CSN: [id number] Arrival date & time: 08/22/21  0930     History  Chief Complaint  Patient presents with   Abdominal Pain    Patrick [name] is a 66 y.o. male w/ hx of ESRD on Dialysis Tues, Thurs, Sat, hx of AVM GI bleed w/ acute anemia, sickle cell disease, choledocolithiasis s/p cholecystectomy, legally blind, ischemic small bowel, pelvic abscess, presenting to the ED with abdominal pain.  Patient reports onset of pain yesterday at home, worsening today during dialysis.  Vaguely LLQ and umbilical pain.  Reports he ran out of oxycodone medications at home.  He did complete dialysis today.  Hospital discharge summary from 08/11/21 reviewed, highlights as noted below  Brief Summary: 66 year old male with history of ESRD on HD, sickle cell disease, permanent atrial fibrillation, history of UGI due to AVMs, recent history (December 2022) of choledocholithiasis and ischemic small bowel requiring cholecystectomy/biliary stent placement/bowel resection-referred to the ED from HD center for hemoglobin of 5.8-subsequent EGD on 2/15 showed bleeding AVMs.  Hospital course complicated by SBO with pelvic abscess, recurrent upper GI bleeding on 2/22 requiring PRBC transfusion-and a repeat EGD on 2/23     Significant events: 2/13>> referred to the ED from HD center for evaluation of hemoglobin of 5.8. 2/15>> EGD: Bleeding AVM-s/p APC 2/17>> SBO w pelvic abscess on CT abdomen/pelvis -NGT placed-CCS consulted 2/19>> percutaneous pelvic drain placed by IR 2/21>>NGT removed 2/22>> redeveloped melena-hemoglobin down to 6.9-PRBC transfusion 2/23>> EGD: Nonbleeding AVM-s/p APC   Significant imaging studies: 2/17>> MRI LS spine: Limited exams-no acute abnormalities. 2/17>> CT abd/pelvis: Questionable SBO and abcess   Significant microbiology data: 2/13>> COVID/influenza PCR: Negative 2/19>> pelvic abscess culture: E. coli/Bacteroides    Procedures: 2/15>> EGD: Multiple bleeding angiectasia's in the stomach treated with argon plasma coagulation 2/19>> Pelvic fluid drain placed, 10 [place] 2/23>> EGD: Few small AVMs with no active bleeding s/p treatment with argon plasma coagulation.    Recent history of choledocholithiasis-s/p ERCP with pancreatic/biliary stents (removed) followed by open cholecystectomy and resection of ischemic small bowel on 05/24/2021   Pelvic abscess s/p IR drainage with repeat CT 2/24 showing complete resolution of abscess (and IR drain removed)  Permanent A Fib - not on A/C due to bleeding GI risk  HPI     Home Medications Prior to Admission medications   Medication Sig Start Date End Date Taking? Authorizing Provider  acetaminophen (TYLENOL) 500 MG tablet Take 500 mg by mouth every 6 (six) hours as needed for moderate pain.   Yes [provider]  allopurinol (ZYLOPRIM) 100 MG tablet TAKE 1 TABLET BY MOUTH EVERY DAY Patient taking differently: Take 100 mg by mouth daily. 07/13/21  Yes Dorena Dew, FNP  AURYXIA 1 GM 210 MG(Fe) tablet Take 210 mg by mouth 3 (three) times daily. 02/11/21  Yes [provider]  folic acid (FOLVITE) 1 MG tablet TAKE 1 TABLET BY MOUTH EVERY DAY Patient taking differently: Take 1 mg by mouth daily. 02/22/21  Yes Dorena Dew, FNP  hydroxyurea (HYDREA) 500 MG capsule Take 1 capsule (500 mg total) by mouth daily. TAKE 1 CAPSULE BY MOUTH EVERY DAY WITH FOOD TO MINIMIZE GI SIDE EFFECTS Patient taking differently: Take 500 mg by mouth daily before breakfast. 01/31/21  Yes Dorena Dew, FNP  lidocaine-prilocaine (EMLA) cream Apply 1 application topically daily as needed (port access).  09/08/19  Yes [provider]  oxycodone (OXY-IR) 5 MG capsule Take 2 capsules (10  mg total) by mouth every 6 (six) hours as needed for pain. 07/31/21  Yes Dorena Dew, FNP  pantoprazole (PROTONIX) 40 MG tablet TAKE 1 TABLET BY MOUTH TWICE A DAY 08/21/21   Yes Willia Craze, NP  tiZANidine (ZANAFLEX) 4 MG tablet Take 0.5 tablets (2 mg total) by mouth every 8 (eight) hours as needed for muscle spasms. 07/18/21  Yes Dorena Dew, FNP  Vitamin D, Ergocalciferol, (DRISDOL) 1.25 MG (50000 UNIT) CAPS capsule TAKE 1 CAPSULE BY MOUTH ONE TIME PER WEEK Patient taking differently: Take 50,000 Units by mouth once a week. Monday 05/15/21  Yes Dorena Dew, FNP  albuterol (VENTOLIN HFA) 108 (90 Base) MCG/ACT inhaler Inhale 2 puffs into the lungs every 6 (six) hours as needed for wheezing or shortness of breath. Patient not taking: Reported on 08/22/2021 05/09/20   Freddi Starr, MD  carvedilol (COREG) 3.125 MG tablet Take 1 tablet (3.125 mg total) by mouth 2 (two) times daily. Patient not taking: Reported on 08/22/2021 08/11/21   Jonetta Osgood, MD  Glycerin-Hypromellose-PEG 400 (VISINE DRY EYE OP) Place 1 drop into both eyes as needed (dryness).    [provider]      Allergies    Patient has no known allergies.    Review of Systems   Review of Systems  Physical Exam Updated Vital Signs BP 125/70    Pulse 89    Temp (!) 97.2 F (36.2 C) (Rectal)    Resp 15    SpO2 99%  Physical Exam Constitutional:      Comments: Thin, frail, chronically ill appearing  HENT:     Head: Normocephalic and atraumatic.  Eyes:     Conjunctiva/sclera: Conjunctivae normal.     Pupils: Pupils are equal, round, and reactive to light.  Cardiovascular:     Rate and Rhythm: Normal rate and regular rhythm.  Pulmonary:     Effort: Pulmonary effort is normal. No respiratory distress.  Abdominal:     General: There is no distension.     Tenderness: There is no abdominal tenderness.  Skin:    General: Skin is warm and dry.  Neurological:     General: No focal deficit present.     Mental Status: He is alert. Mental status is at baseline.  Psychiatric:        Mood and Affect: Mood normal.        Behavior: Behavior normal.    ED Results /  Procedures / Treatments   Labs (all labs ordered are listed, but only abnormal results are displayed) Labs Reviewed  COMPREHENSIVE METABOLIC PANEL - Abnormal; Notable for the following components:      Result Value   Sodium 132 (*)    Chloride 91 (*)    Creatinine, Ser 3.08 (*)    Calcium 8.5 (*)    Albumin 2.4 (*)    Alkaline Phosphatase 173 (*)    Total Bilirubin 1.6 (*)    GFR, Estimated 22 (*)    All other components within normal limits  CBC WITH DIFFERENTIAL/PLATELET - Abnormal; Notable for the following components:   WBC 3.6 (*)    RBC 2.63 (*)    Hemoglobin 9.4 (*)    HCT 26.9 (*)    MCV 102.3 (*)    MCH 35.7 (*)    RDW 21.8 (*)    nRBC 5.3 (*)    Lymphs Abs 0.4 (*)    All other components within normal limits  RESP PANEL BY RT-PCR (  FLU A&B, COVID) ARPGX2  LIPASE, BLOOD  LACTIC ACID, PLASMA  TYPE AND SCREEN    EKG EKG Interpretation  Date/Time:  Tuesday August 22 2021 10:29:41 EST Ventricular Rate:  86 PR Interval:    QRS Duration: 96 QT Interval:  460 QTC Calculation: 551 R Axis:   71 Text Interpretation: Atrial fibrillation Low voltage, extremity leads Abnormal R-wave progression, late transition Prolonged QT interval Confirmed by Octaviano Glow [phone]) on 08/22/2021 10:47:55 AM  Radiology CT Angio Abd/Pel W and/or Wo Contrast  Result Date: 08/22/2021 CLINICAL DATA:  Mesenteric ischemia, acute.  GI bleed. EXAM: CTA ABDOMEN AND PELVIS WITHOUT AND WITH CONTRAST TECHNIQUE: Multidetector CT imaging of the abdomen and pelvis was performed using the standard protocol during bolus administration of intravenous contrast. Multiplanar reconstructed images and MIPs were obtained and reviewed to evaluate the vascular anatomy. RADIATION DOSE REDUCTION: This exam was performed according to the departmental dose-optimization program which includes automated exposure control, adjustment of the mA and/or kV according to patient size and/or use of iterative reconstruction  technique. CONTRAST:  134mL OMNIPAQUE IOHEXOL 350 MG/ML SOLN COMPARISON:  08/11/2021 and 08/04/2021 FINDINGS: VASCULAR Aorta: Normal caliber of the abdominal aorta with mild atherosclerotic disease. No significant stenosis. No evidence for dissection. Celiac: Mild narrowing of the proximal celiac trunk related to the configuration. Left gastric artery originates directly from the aorta just proximal to the celiac trunk. SMA: Patent without evidence of aneurysm, dissection, vasculitis or significant stenosis. Renals: Both renal arteries are patent without evidence of aneurysm, dissection, vasculitis, fibromuscular dysplasia or significant stenosis. IMA: Patent Inflow: Patent without evidence of aneurysm, dissection, vasculitis or significant stenosis. Proximal Outflow: Proximal femoral arteries are patent bilaterally. Veins: Hepatic veins and portal veins are patent. Main mesenteric veins are patent. Review of the MIP images confirms the above findings. NON-VASCULAR Lower chest: Small right pleural effusion and trace left pleural fluid. Cardiomegaly. Small amount of atelectasis at both lung bases. Hepatobiliary: Cholecystectomy. Normal appearance of the liver. Distal common bile duct measures approximately 8 mm and stable. Pancreas: Unremarkable. No pancreatic ductal dilatation or surrounding inflammatory changes. 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 Adrenals/Urinary Tract: Limited evaluation of the adrenal glands without gross abnormality. Evidence for bilateral renal cysts. Cortical thinning in both kidneys. No hydronephrosis. Small amount of fluid in the urinary bladder. Stomach/Bowel: Again noted is an endoscopic gastric clip. Diffuse small bowel dilatation with fluid with air-fluid levels. There is stool throughout the colon. Surgical bowel anastomosis in the right abdomen is distended with stool-like material. Small bowel distal to the  anastomosis is decompressed and this is likely the transition point for the small bowel obstruction. High-density material in the appendix. Lymphatic: No significant lymph node enlargement in the abdomen or pelvis. Limited evaluation for lymphadenopathy due to the lack of intra-abdominal fat. Reproductive: Prostate is unremarkable. Other: Difficult to exclude a small amount of ascites in the abdomen and pelvis due to the diffuse subcutaneous edema. No evidence for free air. Previously, the patient had a fluid collection in the anterior lower abdomen. The drain has been removed from this area but there is a small residual fluid collection that measures 4.5 x 1.4 x 1.1 cm on sequence 7 image 63. There is no gas within the small residual collection. Musculoskeletal: Diffuse sclerosis throughout the bones and compatible with history of sickle cell disease and end-stage renal disease. IMPRESSION: VASCULAR 1. Atherosclerotic disease in the abdomen and pelvis without significant stenosis. 2.  Main visceral arteries are patent. No evidence for mesenteric ischemia. 3. No evidence for active GI bleeding. Endoscopic gastric clip is present. NON-VASCULAR 1. Diffuse small bowel dilatation and findings are suggestive for a high-grade small bowel obstruction. Surgical anastomosis in the right lower quadrant of the abdomen which contains stool-like material and there appears to be a transition point distal to this anastomosis. 2. Difficult to exclude a small amount of ascites in the abdomen and pelvis. 3. Residual fluid collection in the anterior lower abdomen/pelvis. Previously, there was a percutaneous drain in this area. Fluid collection measures up to 4.5 cm. No evidence for gas within the collection. 4. Small bilateral pleural effusions, right side greater than left. These results were called by telephone at the time of interpretation on 08/22/2021 at 4:55 pm to provider Shanayah Kaffenberger , who verbally acknowledged these results.  Electronically Signed   By: Markus Daft M.D.   On: 08/22/2021 16:57    Procedures Ultrasound ED Peripheral IV (Provider)  Date/Time: 08/22/2021 2:28 PM Performed by: Wyvonnia Dusky, MD Authorized by: Wyvonnia Dusky, MD   Procedure details:    Indications: multiple failed IV attempts and poor IV access     Skin Prep: prepped with other     Location:  Left AC   Angiocath:  20 G   Bedside Ultrasound Guided: Yes     Images: not archived     Patient tolerated procedure without complications: Yes     Dressing applied: Yes      Medications Ordered in ED Medications  morphine (PF) 4 MG/ML injection 3 mg (3 mg Intravenous Given 08/22/21 1106)  oxyCODONE (Oxy IR/ROXICODONE) immediate release tablet 5 mg (5 mg Oral Given 08/22/21 1021)  morphine (PF) 4 MG/ML injection 3 mg (3 mg Intravenous Given 08/22/21 1337)  iohexol (OMNIPAQUE) 350 MG/ML injection 100 mL (100 mLs Intravenous Contrast Given 08/22/21 1445)  sodium chloride (PF) 0.9 % injection (  Given by Other 08/22/21 1527)  morphine (PF) 2 MG/ML injection 2 mg (2 mg Intravenous Given 08/22/21 1720)    ED Course/ Medical Decision Making/ A&P Clinical Course as of 08/22/21 [place] Aug 22, 2021  1009 Patient subsequently agreed to rectal temp which was 97.20F [MT]  1103 Hgb stable at 9.4 [MT]  1151 Lactic Acid, Venous: 1.5 [MT]  1329 Left proximal 20 gauge USIV placed, morphine for pain to be given, CT abdomen [MT]  1654 SBO, RLQ transition point, small recollection in pelvis, unclear if recurring infection [MT]  1703 Surgeon paged [MT]  1707 Plan to admit to hospitalist, will need transfer to [facility] for eventually dialysis (non emergent), but anticipate hospital stay extending beyond 2 nights - Dr Brantley Stage general surgeon requesting that he or [facility] surgeon be consulted once patient disposition is finalized; no emergent surgical recommendations [MT]  1712 Patient refused NG tube [MT]  1712 With no leukocytosis and normal lactate have a lower  suspicion for mesenteric ischemia.  He appears to have a bowel obstruction with significant mount of constipation as well.  He will need medical admission again, likely a bowel cleanout, and surgical consult. [MT]  37 Pt pending medical admission for SBO management; patient updated [MT]    Clinical Course User Index [MT] Tea Collums, Carola Rhine, MD                           Medical Decision Making Amount and/or Complexity of Data Reviewed Labs: ordered. Decision-making details documented  in ED Course. Radiology: ordered. ECG/medicine tests: ordered.  Risk Prescription drug management. Decision regarding hospitalization.   This patient presents to the ED with concern for abdominal pain. This involves an extensive number of treatment options, and is a complaint that carries with it a high risk of complications and morbidity.  The differential diagnosis includes recurring abscess vs colitis vs recurring biliary stone vs pancreatitis vs other  Co-morbidities that complicate the patient evaluation: ESRD, sickle cell disease, AVM, high risk for ischemic and bowel bleeding - patient taken of A/C due to concern for GI bleeding.  Multiple recent abdominal complications including infection, GI bleed, ischemic bowel placing this patient at high risk for serious medical condition  Additional history obtained from paramedics  External records from outside source obtained and reviewed including hospital discharge summary as noted above  I ordered and personally interpreted labs.  The pertinent results include:  CMP, CBC, Lipase within recent limits; no anion gap; Cr 3.08 baseline; flu/covid negative; hgb 9.4; lactate wnl Hgb at time of discharge 08/11/21 was 9.0   I ordered imaging studies including CT abdomen pelvis with IV contrast I independently visualized and interpreted imaging which showed bowel obstruction, significant constipation, no evidence of large recurring pelvic abscess or collection, no  report or evidence of mesenteric ischemia. I agree with the radiologist interpretation  The patient was maintained on a cardiac monitor.  I personally viewed and interpreted the cardiac monitored which showed an underlying rhythm of: regular rate  Per my interpretation the patient's ECG shows NSR  I ordered medication including for pain  I have reviewed the patients home medicines and have made adjustments as needed  Test Considered:  - doubt ACS, no chest pain; doubt acute chest syndrome - he denies sx of sickle cell pain crisis   After the interventions noted above, I reevaluated the patient and found that they have: stayed the same  Social Determinants of Health:legally blind, on dialysis   Dispostion:  After consideration of the diagnostic results and the patients response to treatment, I feel that the patent would benefit from admission.         Final Clinical Impression(s) / ED Diagnoses Final diagnoses:  Intestinal obstruction, unspecified cause, unspecified whether partial or complete [facility])    Rx / DC Orders ED Discharge Orders     None         Gaige Sebo, Carola Rhine, MD 08/22/21 1725

## 2021-08-22 NOTE — ED Triage Notes (Signed)
[facility] brings pt in from dialysis for abdominal pain. Denies nausea/vomiting/diarrhea. ?

## 2021-08-22 NOTE — Progress Notes (Signed)
PHARMACY NOTE -  Zosyn ? ?Pharmacy has been assisting with dosing of Zosyn for IAI/sepsis. ?Patient is ESRD on HD TTS, currently on schedule with last session today, no issues. Will continue Zosyn at 2.25 g IV q8 hr and will f/u on Thursday to adjust dose if pt unable to transfer to [facility] for HD. ? ?Pharmacy will sign off, following peripherally for culture results or dose adjustments. Please reconsult if a change in clinical status warrants re-evaluation of dosage. ? ?Reuel Boom, PharmD, BCPS ?[phone] ?08/22/2021, 7:03 PM ? ? ? ?

## 2021-08-22 NOTE — Assessment & Plan Note (Signed)
Reorientation and delirium precaution ?

## 2021-08-22 NOTE — ED Notes (Signed)
Warm blankets given to patient.

## 2021-08-22 NOTE — Assessment & Plan Note (Addendum)
He is frail but does not look toxic.  Vital signs stable.  CT shows a fluid collection measuring 4.5 cm at the previous abscess site.  Hypothermia resolved quickly. ?-IV Zosyn pending IR evaluation-consulted. ?

## 2021-08-22 NOTE — H&P (Addendum)
History and Physical    Patient: Patrick [name] [id number]:[id number] DOB: 07-Sep-1955 DOA: 08/22/2021 DOS: the patient was seen and examined on 08/22/2021 PCP: Dorena Dew, FNP  Patient coming from: Home.  Independently ambulates at baseline.  Lives alone.  Chief Complaint:  Chief Complaint  Patient presents with   Abdominal Pain   HPI:  66 year old M with PMH of ESRD on HD TTS, sickle cell disease, combined CHF, permanent A-fib, upper GI bleed due to AVMs, laparotomy/cholecystectomy/ischemic small bowel resection in 12/22 and recent hospitalization from SBO that has resolved with NGT and pelvic abscess treated with drain placement and antibiotics presenting with "severe" abdominal pain for 1 day.    Patient reports progressive abdominal pain today.  He was able to go to dialysis and complete the whole session.  He describes the pain as severe but not able to characterize.  He states his pain is at his surgical wound.  No radiation.  He denies drainage from surgical site.  No aggravating or elevating factor.  Denies fall or trauma.  He is also out of his oxycodone.  He says he had an appointment at his sickle cell clinic but could not make it due to pain.  He has no nausea or vomiting.  Denies fever, URI symptoms, chest pain or dyspnea.  Reports normal bowel movement about 4 AM this morning.  He denies melena or hematochezia.  Reports making some urine.  Patient reports he ambulates independently.  Lives alone.  Denies smoking cigarette, drinking alcohol or recreational drug use.  He says he is DNR.  In ED, hypothermic to 94.4 but improved to 97.2.  Other vitals stable. CMP and CBC stable other than mild leukopenia to 3.6.  Lactic acid 1.5.  COVID-19 and influenza PCR nonreactive.  CT abdomen and pelvis concerning for SBO with transition site in RLQ just distal to stool like material,, residual fluid collection in anterior lower abdomen/pelvis measuring 4.5 cm and a small bilateral pleural  effusion.  General surgery consulted.  Per EDP, patient refused NG tube although he later agreed when I talked to him.  Review of Systems: As mentioned in the history of present illness. All other systems reviewed and are negative. Past Medical History:  Diagnosis Date   Blood dyscrasia    Blood transfusion    "I've had a bunch of them"   CHF (congestive heart failure) ([place])    followed by hf clinic   Chronic kidney disease (CKD), stage III (moderate) ([place])    Dialysis patient ([place]) 04/2019   AV fistula (right arm)   Gout    Headache(784.0)    "probably weekly" (01/13/2014)   Legally blind    Shortness of breath dyspnea    Sickle cell disease, type SS (HCC)    Swelling abdomen    Swelling of extremity, left    Swelling of extremity, right    Past Surgical History:  Procedure Laterality Date   A/V FISTULAGRAM Right 07/29/2020   Procedure: A/V FISTULAGRAM - Right Arm;  Surgeon: Angelia Mould, MD;  Location: [place] [facility];  Service: Cardiovascular;  Laterality: Right;   BASCILIC VEIN TRANSPOSITION Right 04/12/2014   Procedure: BASILIC VEIN TRANSPOSITION;  Surgeon: Elam Dutch, MD;  Location: [place];  Service: Vascular;  Laterality: Right;   BIOPSY  08/04/2020   Procedure: BIOPSY;  Surgeon: Ladene Artist, MD;  Location: [place];  Service: Gastroenterology;;   BIOPSY  06/28/2021   Procedure: BIOPSY;  Surgeon: Jerene Bears, MD;  Location: [place] [facility];  Service: Gastroenterology;;   BOWEL RESECTION N/A 05/24/2021   Procedure: SMALL BOWEL RESECTION;  Surgeon: Rolm Bookbinder, MD;  Location: [place];  Service: General;  Laterality: N/A;   CARDIAC CATHETERIZATION  05/2011   CARDIOVERSION N/A 06/05/2018   Procedure: CARDIOVERSION;  Surgeon: Jolaine Artist, MD;  Location: [facility] ENDOSCOPY;  Service: Cardiovascular;  Laterality: N/A;   CARDIOVERSION N/A 10/06/2019   Procedure: CARDIOVERSION;  Surgeon: Jolaine Artist, MD;  Location: [facility] ENDOSCOPY;  Service:  Cardiovascular;  Laterality: N/A;   CHOLECYSTECTOMY N/A 05/24/2021   Procedure: OPEN CHOLECYSTECTOMY;  Surgeon: Rolm Bookbinder, MD;  Location: [place];  Service: General;  Laterality: N/A;   COLONOSCOPY WITH PROPOFOL N/A 09/24/2017   Procedure: COLONOSCOPY WITH PROPOFOL;  Surgeon: Jackquline Denmark, MD;  Location: [facility] ENDOSCOPY;  Service: Endoscopy;  Laterality: N/A;   ESOPHAGOGASTRODUODENOSCOPY N/A 09/19/2017   Procedure: ESOPHAGOGASTRODUODENOSCOPY (EGD);  Surgeon: Jackquline Denmark, MD;  Location: [facility] ENDOSCOPY;  Service: Endoscopy;  Laterality: N/A;   ESOPHAGOGASTRODUODENOSCOPY N/A 05/29/2021   Procedure: ESOPHAGOGASTRODUODENOSCOPY (EGD);  Surgeon: Milus Banister, MD;  Location: [facility] ENDOSCOPY;  Service: Gastroenterology;  Laterality: N/A;   ESOPHAGOGASTRODUODENOSCOPY (EGD) WITH PROPOFOL N/A 08/04/2020   Procedure: ESOPHAGOGASTRODUODENOSCOPY (EGD) WITH PROPOFOL;  Surgeon: Ladene Artist, MD;  Location: [place];  Service: Gastroenterology;  Laterality: N/A;   ESOPHAGOGASTRODUODENOSCOPY (EGD) WITH PROPOFOL N/A 06/28/2021   Procedure: ESOPHAGOGASTRODUODENOSCOPY (EGD) WITH PROPOFOL;  Surgeon: Jerene Bears, MD;  Location: [facility] ENDOSCOPY;  Service: Gastroenterology;  Laterality: N/A;   ESOPHAGOGASTRODUODENOSCOPY (EGD) WITH PROPOFOL N/A 08/02/2021   Procedure: ESOPHAGOGASTRODUODENOSCOPY (EGD) WITH PROPOFOL;  Surgeon: Lavena Bullion, DO;  Location: [place];  Service: Gastroenterology;  Laterality: N/A;   ESOPHAGOGASTRODUODENOSCOPY (EGD) WITH PROPOFOL N/A 08/10/2021   Procedure: ESOPHAGOGASTRODUODENOSCOPY (EGD) WITH PROPOFOL;  Surgeon: Jackquline Denmark, MD;  Location: [place];  Service: Gastroenterology;  Laterality: N/A;   HEMOSTASIS CLIP PLACEMENT  05/29/2021   Procedure: HEMOSTASIS CLIP PLACEMENT;  Surgeon: Milus Banister, MD;  Location: [facility] ENDOSCOPY;  Service: Gastroenterology;;   HOT HEMOSTASIS N/A 05/29/2021   Procedure: HOT HEMOSTASIS (ARGON PLASMA COAGULATION/BICAP);  Surgeon: Milus Banister, MD;  Location: [facility] ENDOSCOPY;  Service: Gastroenterology;  Laterality: N/A;   HOT HEMOSTASIS N/A 06/28/2021   Procedure: HOT HEMOSTASIS (ARGON PLASMA COAGULATION/BICAP);  Surgeon: Jerene Bears, MD;  Location: [facility] ENDOSCOPY;  Service: Gastroenterology;  Laterality: N/A;   HOT HEMOSTASIS N/A 08/02/2021   Procedure: HOT HEMOSTASIS (ARGON PLASMA COAGULATION/BICAP);  Surgeon: Lavena Bullion, DO;  Location: [facility] ENDOSCOPY;  Service: Gastroenterology;  Laterality: N/A;   HOT HEMOSTASIS N/A 08/10/2021   Procedure: HOT HEMOSTASIS (ARGON PLASMA COAGULATION/BICAP);  Surgeon: Jackquline Denmark, MD;  Location: [facility] ENDOSCOPY;  Service: Gastroenterology;  Laterality: N/A;   IR PERC TUN PERIT CATH WO PORT S&I /IMAG  05/23/2021   IR REMOVAL TUN CV CATH W/O FL  06/10/2021   IR US GUIDE VASC ACCESS RIGHT  05/23/2021   LAPAROSCOPY N/A 05/24/2021   Procedure: LAPAROSCOPY DIAGNOSTIC;  Surgeon: Rolm Bookbinder, MD;  Location: [place];  Service: General;  Laterality: N/A;   LAPAROTOMY N/A 05/24/2021   Procedure: EXPLORATORY LAPAROTOMY;  Surgeon: Rolm Bookbinder, MD;  Location: [place];  Service: General;  Laterality: N/A;   LEFT AND RIGHT HEART CATHETERIZATION WITH CORONARY ANGIOGRAM N/A 06/11/2011   Procedure: LEFT AND RIGHT HEART CATHETERIZATION WITH CORONARY ANGIOGRAM;  Surgeon: Larey Dresser, MD;  Location: [facility] CATH LAB;  Service: Cardiovascular;  Laterality: N/A;   PERIPHERAL VASCULAR BALLOON ANGIOPLASTY Right 07/29/2020   Procedure: PERIPHERAL VASCULAR  BALLOON ANGIOPLASTY;  Surgeon: Angelia Mould, MD;  Location: [place] CV [facility];  Service: Cardiovascular;  Laterality: Right;  arm fistula   REVISON OF ARTERIOVENOUS FISTULA Right 08/08/2020   Procedure: ANEURYSM EXCISION OF RIGHT UPPER EXTREMITY ARTERIOVENOUS FISTULA;  Surgeon: Angelia Mould, MD;  Location: [facility] OR;  Service: Vascular;  Laterality: Right;   SCLEROTHERAPY  05/29/2021   Procedure: Clide Deutscher;  Surgeon: Milus Banister, MD;  Location:  [facility] ENDOSCOPY;  Service: Gastroenterology;;   Lavell Islam REMOVAL  05/29/2021   Procedure: STENT REMOVAL;  Surgeon: Milus Banister, MD;  Location: [facility] ENDOSCOPY;  Service: Gastroenterology;;   TEE WITHOUT CARDIOVERSION N/A 12/27/2015   Procedure: TRANSESOPHAGEAL ECHOCARDIOGRAM (TEE);  Surgeon: Sueanne Margarita, MD;  Location: [facility] ENDOSCOPY;  Service: Cardiovascular;  Laterality: N/A;   Social History:  reports that he quit smoking about 5 years ago. His smoking use included cigarettes. He has a 20.50 pack-year smoking history. He has never used smokeless tobacco. He reports that he does not currently use alcohol after a past usage of about 3.0 standard drinks per week. He reports that he does not use drugs.  No Known Allergies  Family History  Problem Relation Age of Onset   Alcohol abuse Mother    Liver disease Mother    Cirrhosis Mother    Heart attack Mother     Prior to Admission medications   Medication Sig Start Date End Date Taking? Authorizing Provider  acetaminophen (TYLENOL) 500 MG tablet Take 500 mg by mouth every 6 (six) hours as needed for moderate pain.   Yes [provider]  allopurinol (ZYLOPRIM) 100 MG tablet TAKE 1 TABLET BY MOUTH EVERY DAY Patient taking differently: Take 100 mg by mouth daily. 07/13/21  Yes Dorena Dew, FNP  AURYXIA 1 GM 210 MG(Fe) tablet Take 210 mg by mouth 3 (three) times daily. 02/11/21  Yes [provider]  folic acid (FOLVITE) 1 MG tablet TAKE 1 TABLET BY MOUTH EVERY DAY Patient taking differently: Take 1 mg by mouth daily. 02/22/21  Yes Dorena Dew, FNP  hydroxyurea (HYDREA) 500 MG capsule Take 1 capsule (500 mg total) by mouth daily. TAKE 1 CAPSULE BY MOUTH EVERY DAY WITH FOOD TO MINIMIZE GI SIDE EFFECTS Patient taking differently: Take 500 mg by mouth daily before breakfast. 01/31/21  Yes Dorena Dew, FNP  lidocaine-prilocaine (EMLA) cream Apply 1 application topically daily as needed (port access).  09/08/19  Yes [provider]  oxycodone (OXY-IR) 5 MG capsule Take 2 capsules (10 mg total) by mouth every 6 (six) hours as needed for pain. 07/31/21  Yes Dorena Dew, FNP  pantoprazole (PROTONIX) 40 MG tablet TAKE 1 TABLET BY MOUTH TWICE A DAY 08/21/21  Yes Willia Craze, NP  tiZANidine (ZANAFLEX) 4 MG tablet Take 0.5 tablets (2 mg total) by mouth every 8 (eight) hours as needed for muscle spasms. 07/18/21  Yes Dorena Dew, FNP  Vitamin D, Ergocalciferol, (DRISDOL) 1.25 MG (50000 UNIT) CAPS capsule TAKE 1 CAPSULE BY MOUTH ONE TIME PER WEEK Patient taking differently: Take 50,000 Units by mouth once a week. Monday 05/15/21  Yes Dorena Dew, FNP  albuterol (VENTOLIN HFA) 108 (90 Base) MCG/ACT inhaler Inhale 2 puffs into the lungs every 6 (six) hours as needed for wheezing or shortness of breath. Patient not taking: Reported on 08/22/2021 05/09/20   Freddi Starr, MD  carvedilol (COREG) 3.125 MG tablet Take 1 tablet (3.125 mg total) by mouth 2 (two) times daily.  Patient not taking: Reported on 08/22/2021 08/11/21   Jonetta Osgood, MD  Glycerin-Hypromellose-PEG 400 (VISINE DRY EYE OP) Place 1 drop into both eyes as needed (dryness).    [provider]    Physical Exam: Vitals:   08/22/21 1230 08/22/21 1300 08/22/21 1540 08/22/21 1630  BP: 106/77 115/74 (!) 138/102 125/70  Pulse: 78 82 90 89  Resp: 16 14 17 15   Temp:      TempSrc:      SpO2: 100% 100% 100% 99%   GENERAL: Frail.  Nontoxic. HEENT: MMM.  Vision and hearing grossly intact.  NECK: Supple.  No apparent JVD.  RESP: 99% on RA.  No IWOB.  Fair aeration bilaterally. CVS:  RRR. Heart sounds normal.  ABD/GI/GU: BS+. Abd soft.  Slight tenderness in RLQ.  Laparotomy wound appears clean.  See picture under media MSK/EXT: Significant muscle mass and subcu fat loss.  1+ BLE edema. SKIN: Laparotomy wound as above NEURO: Awake and alert. Oriented appropriately.  No apparent focal neuro deficit. PSYCH: Calm. Normal affect.    Data Reviewed: See HPI  Assessment and Plan: * SBO (small bowel obstruction) ([place]) Presents with abdominal pain for 1 day.  No nausea or vomiting.  Surprisingly had normal BM about 4 AM.  CT A/P concerning for SBO with transition point in RLQ.  Seems to be recurrent issue.  Hospitalized for the same last month and treated with NG tube.  He had recent laparotomy with cholecystectomy and resection of ischemic small bowel in 05/2021.  Initially refused NG tube but he agreed after I talk to him.  General surgery consulted in ED. -Admitted to [facility] given ESRD. -General surgery to manage -N.p.o. -D5-1/2NS at 50 cc an hour given ESRD and history of CHF -Change IV morphine to IV Dilaudid given ESRD. -IV Compazine as needed for nausea and emesis -IV Protonix  Prolonged QT interval Minimize or avoid QT prolonging drugs  ESRD on HD TTS Reportedly had full session today outpatient.  No cardiopulmonary symptoms.  Appears euvolemic except for BLE edema.  No indication for emergent HD. -Consult nephrology in the morning  Chronic combined systolic and diastolic CHF (congestive heart failure) (HCC) No cardiopulmonary symptoms.  He has chronic BLE edema.  Still makes some urine. -Fluid management by dialysis  Abdominal fluid collection Residual 4.5 cm fluid collection in the anterior lower abdomen/pelvis.  Previously treated with drain placement, antibiotics and resolved -IR consulted for percutaneous drainage  Hypothermia He is frail but does not look toxic.  Vital signs stable.  CT shows a fluid collection measuring 4.5 cm at the previous abscess site.  Hypothermia resolved quickly. -IV Zosyn pending IR evaluation-consulted.  Permanent atrial fibrillation ([place]) On low-dose Coreg at home.  Not on anticoagulation due to GI bleed/AVM -IV metoprolol 2.5 mg as needed  Sickle cell anemia (HCC) Not in crisis. -Notify his hematologist/sickle cell provider in the morning  COPD not affecting current  episode of care [facility]) Does not seem to be medication.  No respiratory issues.  Visual impairment Reorientation and delirium precaution  Protein-calorie malnutrition, severe Very frail with significant muscle mass and subcu fat loss.  -Consult dietitian once he started taking p.o.  Anemia Recent Labs    08/04/21 0241 08/04/21 1649 08/05/21 0942 08/06/21 0219 08/07/21 0435 08/08/21 0123 08/09/21 0213 08/10/21 0344 08/11/21 0830 08/22/21 0945  HGB 7.8* 9.2* 7.3* 7.9* 8.1* 7.8* 6.9* 7.7* 9.0* 9.4*  Hgb higher than baseline.  Has history of upper GI bleed and AVM that  was treated with APC in 07/2021. -Monitor H&H       Advance Care Planning:   Code Status: DNR   Consults: General surgery  Family Communication: None  Severity of Illness: The appropriate patient status for this patient is INPATIENT. Inpatient status is judged to be reasonable and necessary in order to provide the required intensity of service to ensure the patient's safety. The patient's presenting symptoms, physical exam findings, and initial radiographic and laboratory data in the context of their chronic comorbidities is felt to place them at high risk for further clinical deterioration. Furthermore, it is not anticipated that the patient will be medically stable for discharge from the hospital within 2 midnights of admission.   * I certify that at the point of admission it is my clinical judgment that the patient will require inpatient hospital care spanning beyond 2 midnights from the point of admission due to high intensity of service, high risk for further deterioration and high frequency of surveillance required.*  Author: Mercy Riding, MD 08/22/2021 6:44 PM  For on call review [email or web address].

## 2021-08-22 NOTE — Consult Note (Signed)
Reason for Consult: Small bowel obstruction Referring Physician: GONFA MD  Patrick [name] is an 66 y.o. male.  HPI: Patient presents with 1 day history of diffuse abdominal pain.  Complex history of laparoscopy with subsequent laparotomy December 2022 by Dr. Donne Hazel with small bowel resection and cholecystectomy.  He was discharged 07/2421 after prolonged hospital stay.  He did at that time have a partial bowel obstruction.  This resolved and he was discharged.  He began to have more abdominal pain yesterday with nausea and vomiting.  CT scan was obtained without contrast which shows a significant amount of small bowel dilation with equalization at his small bowel anastomosis.  There is air and stool in the colon without contrast it is difficult to tell much of anything.  This appears to be either persistent partial small bowel obstruction or recurrent small bowel obstruction.  Last BM was yesterday.  He is on dialysis and is going to transfer to [facility] from [facility].  His pain is diffuse crampy in nature anywhere between a 5-10 out of 10.  I reviewed his CT scan which shows significant small bowel dilation was a trace amount of fluid and bilateral pleural effusions.  Past Medical History:  Diagnosis Date   Blood dyscrasia    Blood transfusion    "I've had a bunch of them"   CHF (congestive heart failure) ([place])    followed by hf clinic   Chronic kidney disease (CKD), stage III (moderate) ([place])    Dialysis patient ([place]) 04/2019   AV fistula (right arm)   Gout    Headache(784.0)    "probably weekly" (01/13/2014)   Legally blind    Shortness of breath dyspnea    Sickle cell disease, type SS (HCC)    Swelling abdomen    Swelling of extremity, left    Swelling of extremity, right     Past Surgical History:  Procedure Laterality Date   A/V FISTULAGRAM Right 07/29/2020   Procedure: A/V FISTULAGRAM - Right Arm;  Surgeon: Angelia Mould, MD;  Location: [place] [facility];   Service: Cardiovascular;  Laterality: Right;   BASCILIC VEIN TRANSPOSITION Right 04/12/2014   Procedure: BASILIC VEIN TRANSPOSITION;  Surgeon: Elam Dutch, MD;  Location: [place];  Service: Vascular;  Laterality: Right;   BIOPSY  08/04/2020   Procedure: BIOPSY;  Surgeon: Ladene Artist, MD;  Location: [facility] ENDOSCOPY;  Service: Gastroenterology;;   BIOPSY  06/28/2021   Procedure: BIOPSY;  Surgeon: Jerene Bears, MD;  Location: [facility] ENDOSCOPY;  Service: Gastroenterology;;   BOWEL RESECTION N/A 05/24/2021   Procedure: SMALL BOWEL RESECTION;  Surgeon: Rolm Bookbinder, MD;  Location: [place];  Service: General;  Laterality: N/A;   CARDIAC CATHETERIZATION  05/2011   CARDIOVERSION N/A 06/05/2018   Procedure: CARDIOVERSION;  Surgeon: Jolaine Artist, MD;  Location: [place];  Service: Cardiovascular;  Laterality: N/A;   CARDIOVERSION N/A 10/06/2019   Procedure: CARDIOVERSION;  Surgeon: Jolaine Artist, MD;  Location: [facility] ENDOSCOPY;  Service: Cardiovascular;  Laterality: N/A;   CHOLECYSTECTOMY N/A 05/24/2021   Procedure: OPEN CHOLECYSTECTOMY;  Surgeon: Rolm Bookbinder, MD;  Location: [place];  Service: General;  Laterality: N/A;   COLONOSCOPY WITH PROPOFOL N/A 09/24/2017   Procedure: COLONOSCOPY WITH PROPOFOL;  Surgeon: Jackquline Denmark, MD;  Location: [place];  Service: Endoscopy;  Laterality: N/A;   ESOPHAGOGASTRODUODENOSCOPY N/A 09/19/2017   Procedure: ESOPHAGOGASTRODUODENOSCOPY (EGD);  Surgeon: Jackquline Denmark, MD;  Location: [facility] ENDOSCOPY;  Service: Endoscopy;  Laterality: N/A;  ESOPHAGOGASTRODUODENOSCOPY N/A 05/29/2021   Procedure: ESOPHAGOGASTRODUODENOSCOPY (EGD);  Surgeon: Milus Banister, MD;  Location: [facility] ENDOSCOPY;  Service: Gastroenterology;  Laterality: N/A;   ESOPHAGOGASTRODUODENOSCOPY (EGD) WITH PROPOFOL N/A 08/04/2020   Procedure: ESOPHAGOGASTRODUODENOSCOPY (EGD) WITH PROPOFOL;  Surgeon: Ladene Artist, MD;  Location: [place];  Service: Gastroenterology;  Laterality: N/A;    ESOPHAGOGASTRODUODENOSCOPY (EGD) WITH PROPOFOL N/A 06/28/2021   Procedure: ESOPHAGOGASTRODUODENOSCOPY (EGD) WITH PROPOFOL;  Surgeon: Jerene Bears, MD;  Location: [facility] ENDOSCOPY;  Service: Gastroenterology;  Laterality: N/A;   ESOPHAGOGASTRODUODENOSCOPY (EGD) WITH PROPOFOL N/A 08/02/2021   Procedure: ESOPHAGOGASTRODUODENOSCOPY (EGD) WITH PROPOFOL;  Surgeon: Lavena Bullion, DO;  Location: [place];  Service: Gastroenterology;  Laterality: N/A;   ESOPHAGOGASTRODUODENOSCOPY (EGD) WITH PROPOFOL N/A 08/10/2021   Procedure: ESOPHAGOGASTRODUODENOSCOPY (EGD) WITH PROPOFOL;  Surgeon: Jackquline Denmark, MD;  Location: [place];  Service: Gastroenterology;  Laterality: N/A;   HEMOSTASIS CLIP PLACEMENT  05/29/2021   Procedure: HEMOSTASIS CLIP PLACEMENT;  Surgeon: Milus Banister, MD;  Location: [facility] ENDOSCOPY;  Service: Gastroenterology;;   HOT HEMOSTASIS N/A 05/29/2021   Procedure: HOT HEMOSTASIS (ARGON PLASMA COAGULATION/BICAP);  Surgeon: Milus Banister, MD;  Location: [facility] ENDOSCOPY;  Service: Gastroenterology;  Laterality: N/A;   HOT HEMOSTASIS N/A 06/28/2021   Procedure: HOT HEMOSTASIS (ARGON PLASMA COAGULATION/BICAP);  Surgeon: Jerene Bears, MD;  Location: [facility] ENDOSCOPY;  Service: Gastroenterology;  Laterality: N/A;   HOT HEMOSTASIS N/A 08/02/2021   Procedure: HOT HEMOSTASIS (ARGON PLASMA COAGULATION/BICAP);  Surgeon: Lavena Bullion, DO;  Location: [facility] ENDOSCOPY;  Service: Gastroenterology;  Laterality: N/A;   HOT HEMOSTASIS N/A 08/10/2021   Procedure: HOT HEMOSTASIS (ARGON PLASMA COAGULATION/BICAP);  Surgeon: Jackquline Denmark, MD;  Location: [place] ENDOSCOPY;  Service: Gastroenterology;  Laterality: N/A;   IR PERC TUN PERIT CATH WO PORT S&I /IMAG  05/23/2021   IR REMOVAL TUN CV CATH W/O FL  06/10/2021   IR US GUIDE VASC ACCESS RIGHT  05/23/2021   LAPAROSCOPY N/A 05/24/2021   Procedure: LAPAROSCOPY DIAGNOSTIC;  Surgeon: Rolm Bookbinder, MD;  Location: [place];  Service: General;  Laterality: N/A;    LAPAROTOMY N/A 05/24/2021   Procedure: EXPLORATORY LAPAROTOMY;  Surgeon: Rolm Bookbinder, MD;  Location: [place];  Service: General;  Laterality: N/A;   LEFT AND RIGHT HEART CATHETERIZATION WITH CORONARY ANGIOGRAM N/A 06/11/2011   Procedure: LEFT AND RIGHT HEART CATHETERIZATION WITH CORONARY ANGIOGRAM;  Surgeon: Larey Dresser, MD;  Location: [facility] CATH LAB;  Service: Cardiovascular;  Laterality: N/A;   PERIPHERAL VASCULAR BALLOON ANGIOPLASTY Right 07/29/2020   Procedure: PERIPHERAL VASCULAR BALLOON ANGIOPLASTY;  Surgeon: Angelia Mould, MD;  Location: [place] [facility] LAB;  Service: Cardiovascular;  Laterality: Right;  arm fistula   REVISON OF ARTERIOVENOUS FISTULA Right 08/08/2020   Procedure: ANEURYSM EXCISION OF RIGHT UPPER EXTREMITY ARTERIOVENOUS FISTULA;  Surgeon: Angelia Mould, MD;  Location: [facility] OR;  Service: Vascular;  Laterality: Right;   SCLEROTHERAPY  05/29/2021   Procedure: Clide Deutscher;  Surgeon: Milus Banister, MD;  Location: [facility] ENDOSCOPY;  Service: Gastroenterology;;   Lavell Islam REMOVAL  05/29/2021   Procedure: STENT REMOVAL;  Surgeon: Milus Banister, MD;  Location: [facility] ENDOSCOPY;  Service: Gastroenterology;;   TEE WITHOUT CARDIOVERSION N/A 12/27/2015   Procedure: TRANSESOPHAGEAL ECHOCARDIOGRAM (TEE);  Surgeon: Sueanne Margarita, MD;  Location: [facility] ENDOSCOPY;  Service: Cardiovascular;  Laterality: N/A;    Family History  Problem Relation Age of Onset   Alcohol abuse Mother    Liver disease Mother    Cirrhosis Mother    Heart attack Mother  Social History:  reports that he quit smoking about 5 years ago. His smoking use included cigarettes. He has a 20.50 pack-year smoking history. He has never used smokeless tobacco. He reports that he does not currently use alcohol after a past usage of about 3.0 standard drinks per week. He reports that he does not use drugs.  Allergies: No Known Allergies  Medications: I have reviewed the patient's current  medications.  Results for orders placed or performed during the hospital encounter of 08/22/21 (from the past 48 hour(s))  Comprehensive metabolic panel     Status: Abnormal   Collection Time: 08/22/21  9:45 AM  Result Value Ref Range   Sodium 132 (L) 135 - 145 mmol/L   Potassium 3.9 3.5 - 5.1 mmol/L   Chloride 91 (L) 98 - 111 mmol/L   CO2 27 22 - 32 mmol/L   Glucose, Bld 83 70 - 99 mg/dL    Comment: Glucose reference range applies only to samples taken after fasting for at least 8 hours.   BUN 21 8 - 23 mg/dL   Creatinine, Ser 3.08 (H) 0.61 - 1.24 mg/dL   Calcium 8.5 (L) 8.9 - 10.3 mg/dL   Total Protein 7.7 6.5 - 8.1 g/dL   Albumin 2.4 (L) 3.5 - 5.0 g/dL   AST 38 15 - 41 U/L   ALT 22 0 - 44 U/L   Alkaline Phosphatase 173 (H) 38 - 126 U/L   Total Bilirubin 1.6 (H) 0.3 - 1.2 mg/dL   GFR, Estimated 22 (L) >60 mL/min    Comment: (NOTE) Calculated using the CKD-EPI Creatinine Equation (2021)    Anion gap 14 5 - 15    Comment: Performed at [facility], [place] [address], [place], [place] [address]  CBC with Differential     Status: Abnormal   Collection Time: 08/22/21  9:45 AM  Result Value Ref Range   WBC 3.6 (L) 4.0 - 10.5 K/uL   RBC 2.63 (L) 4.22 - 5.81 MIL/uL   Hemoglobin 9.4 (L) 13.0 - 17.0 g/dL   HCT 26.9 (L) 39.0 - 52.0 %   MCV 102.3 (H) 80.0 - 100.0 fL   MCH 35.7 (H) 26.0 - 34.0 pg   MCHC 34.9 30.0 - 36.0 g/dL   RDW 21.8 (H) 11.5 - 15.5 %   Platelets 189 150 - 400 K/uL   nRBC 5.3 (H) 0.0 - 0.2 %   Neutrophils Relative % 66 %   Neutro Abs 2.4 1.7 - 7.7 K/uL   Lymphocytes Relative 10 %   Lymphs Abs 0.4 (L) 0.7 - 4.0 K/uL   Monocytes Relative 21 %   Monocytes Absolute 0.8 0.1 - 1.0 K/uL   Eosinophils Relative 0 %   Eosinophils Absolute 0.0 0.0 - 0.5 K/uL   Basophils Relative 2 %   Basophils Absolute 0.1 0.0 - 0.1 K/uL   Immature Granulocytes 1 %   Abs Immature Granulocytes 0.02 0.00 - 0.07 K/uL   Target Cells PRESENT     Comment: Performed at  [facility], [place] [address], [place], [place] [address]  Lipase, blood     Status: None   Collection Time: 08/22/21  9:45 AM  Result Value Ref Range   Lipase 29 11 - 51 U/L    Comment: Performed at [facility], [place] [address] [place], [place] [address]  Resp Panel by RT-PCR (Flu A&B, Covid) Nasopharyngeal Swab     Status: None   Collection Time: 08/22/21  9:45 AM   Specimen: Nasopharyngeal Swab; Nasopharyngeal(NP) swabs in vial transport medium  Result Value Ref Range   SARS Coronavirus 2 by RT PCR NEGATIVE NEGATIVE    Comment: (NOTE) SARS-CoV-2 target nucleic acids are NOT DETECTED.  The SARS-CoV-2 RNA is generally detectable in upper respiratory specimens during the acute phase of infection. The lowest concentration of SARS-CoV-2 viral copies this assay can detect is 138 copies/mL. A negative result does not preclude SARS-Cov-2 infection and should not be used as the sole basis for treatment or other patient management decisions. A negative result may occur with  improper specimen collection/handling, submission of specimen other than nasopharyngeal swab, presence of viral mutation(s) within the areas targeted by this assay, and inadequate number of viral copies(<138 copies/mL). A negative result must be combined with clinical observations, patient history, and epidemiological information. The expected result is Negative.  Fact Sheet for Patients:  EntrepreneurPulse.com.au  Fact Sheet for Healthcare Providers:  IncredibleEmployment.be  This test is no t yet approved or cleared by the [place] FDA and  has been authorized for detection and/or diagnosis of SARS-CoV-2 by FDA under an Emergency Use Authorization (EUA). This EUA will remain  in effect (meaning this test can be used) for the duration of the COVID-19 declaration under Section 564(b)(1) of the Act, 21 U.S.C.section 360bbb-3(b)(1), unless  the authorization is terminated  or revoked sooner.       Influenza A by PCR NEGATIVE NEGATIVE   Influenza B by PCR NEGATIVE NEGATIVE    Comment: (NOTE) The Xpert Xpress SARS-CoV-2/FLU/RSV plus assay is intended as an aid in the diagnosis of influenza from Nasopharyngeal swab specimens and should not be used as a sole basis for treatment. Nasal washings and aspirates are unacceptable for Xpert Xpress SARS-CoV-2/FLU/RSV testing.  Fact Sheet for Patients: EntrepreneurPulse.com.au  Fact Sheet for Healthcare Providers: IncredibleEmployment.be  This test is not yet approved or cleared by the [place] FDA and has been authorized for detection and/or diagnosis of SARS-CoV-2 by FDA under an Emergency Use Authorization (EUA). This EUA will remain in effect (meaning this test can be used) for the duration of the COVID-19 declaration under Section 564(b)(1) of the Act, 21 U.S.C. section 360bbb-3(b)(1), unless the authorization is terminated or revoked.  Performed at [facility], [place] [address], [place], [place] [address]   Lactic acid, plasma     Status: None   Collection Time: 08/22/21 10:02 AM  Result Value Ref Range   Lactic Acid, Venous 1.5 0.5 - 1.9 mmol/L    Comment: Performed at [facility], [place] [address] [place], [place] [address]  Type and screen [place]     Status: None   Collection Time: 08/22/21 11:11 AM  Result Value Ref Range   ABO/RH(D) AB POS    Antibody Screen NEG    Sample Expiration      08/25/2021,2359 Performed at [facility], [place] [address], [place], [place] [address]     CT Angio Abd/Pel W and/or Wo Contrast  Result Date: 08/22/2021 CLINICAL DATA:  Mesenteric ischemia, acute.  GI bleed. EXAM: CTA ABDOMEN AND PELVIS WITHOUT AND WITH CONTRAST TECHNIQUE: Multidetector CT imaging of the abdomen and pelvis was performed using the standard  protocol during bolus administration of intravenous contrast. Multiplanar reconstructed images and MIPs were obtained and reviewed to evaluate the vascular anatomy. RADIATION DOSE REDUCTION: This exam was performed according to the departmental dose-optimization program which includes automated exposure control, adjustment of the  mA and/or kV according to patient size and/or use of iterative reconstruction technique. CONTRAST:  12mL OMNIPAQUE IOHEXOL 350 MG/ML SOLN COMPARISON:  08/11/2021 and 08/04/2021 FINDINGS: VASCULAR Aorta: Normal caliber of the abdominal aorta with mild atherosclerotic disease. No significant stenosis. No evidence for dissection. Celiac: Mild narrowing of the proximal celiac trunk related to the configuration. Left gastric artery originates directly from the aorta just proximal to the celiac trunk. SMA: Patent without evidence of aneurysm, dissection, vasculitis or significant stenosis. Renals: Both renal arteries are patent without evidence of aneurysm, dissection, vasculitis, fibromuscular dysplasia or significant stenosis. IMA: Patent Inflow: Patent without evidence of aneurysm, dissection, vasculitis or significant stenosis. Proximal Outflow: Proximal femoral arteries are patent bilaterally. Veins: Hepatic veins and portal veins are patent. Main mesenteric veins are patent. Review of the MIP images confirms the above findings. NON-VASCULAR Lower chest: Small right pleural effusion and trace left pleural fluid. Cardiomegaly. Small amount of atelectasis at both lung bases. Hepatobiliary: Cholecystectomy. Normal appearance of the liver. Distal common bile duct measures approximately 8 mm and stable. Pancreas: Unremarkable. No pancreatic ductal dilatation or surrounding inflammatory changes. 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 Adrenals/Urinary Tract: Limited evaluation of the adrenal glands without gross abnormality.  Evidence for bilateral renal cysts. Cortical thinning in both kidneys. No hydronephrosis. Small amount of fluid in the urinary bladder. Stomach/Bowel: Again noted is an endoscopic gastric clip. Diffuse small bowel dilatation with fluid with air-fluid levels. There is stool throughout the colon. Surgical bowel anastomosis in the right abdomen is distended with stool-like material. Small bowel distal to the anastomosis is decompressed and this is likely the transition point for the small bowel obstruction. High-density material in the appendix. Lymphatic: No significant lymph node enlargement in the abdomen or pelvis. Limited evaluation for lymphadenopathy due to the lack of intra-abdominal fat. Reproductive: Prostate is unremarkable. Other: Difficult to exclude a small amount of ascites in the abdomen and pelvis due to the diffuse subcutaneous edema. No evidence for free air. Previously, the patient had a fluid collection in the anterior lower abdomen. The drain has been removed from this area but there is a small residual fluid collection that measures 4.5 x 1.4 x 1.1 cm on sequence 7 image 63. There is no gas within the small residual collection. Musculoskeletal: Diffuse sclerosis throughout the bones and compatible with history of sickle cell disease and end-stage renal disease. IMPRESSION: VASCULAR 1. Atherosclerotic disease in the abdomen and pelvis without significant stenosis. 2. Main visceral arteries are patent. No evidence for mesenteric ischemia. 3. No evidence for active GI bleeding. Endoscopic gastric clip is present. NON-VASCULAR 1. Diffuse small bowel dilatation and findings are suggestive for a high-grade small bowel obstruction. Surgical anastomosis in the right lower quadrant of the abdomen which contains stool-like material and there appears to be a transition point distal to this anastomosis. 2. Difficult to exclude a small amount of ascites in the abdomen and pelvis. 3. Residual fluid collection  in the anterior lower abdomen/pelvis. Previously, there was a percutaneous drain in this area. Fluid collection measures up to 4.5 cm. No evidence for gas within the collection. 4. Small bilateral pleural effusions, right side greater than left. These results were called by telephone at the time of interpretation on 08/22/2021 at 4:55 pm to provider MATTHEW TRIFAN , who verbally acknowledged these results. Electronically Signed   By: Markus Daft M.D.   On: 08/22/2021 16:57    Review of Systems  Constitutional:  Positive for activity change, appetite change and fatigue.  Respiratory: Negative.    Cardiovascular:  Positive for leg swelling.  Gastrointestinal:  Positive for abdominal distention, abdominal pain, nausea and vomiting.  All other systems reviewed and are negative. Blood pressure 135/71, pulse 88, temperature (!) 97.2 F (36.2 C), temperature source Rectal, resp. rate 12, SpO2 100 %. Physical Exam Constitutional:      Appearance: He is well-developed.  HENT:     Head: Normocephalic.  Pulmonary:     Effort: Pulmonary effort is normal.  Abdominal:     General: Abdomen is protuberant. A surgical scar is present. Bowel sounds are absent. There is distension.     Palpations: Abdomen is soft.     Tenderness: There is generalized abdominal tenderness. There is no guarding or rebound.     Hernia: No hernia is present.     Comments: Midline scar with granulation.  Skin:    General: Skin is warm.     Comments: AV graft RUE   Neurological:     General: No focal deficit present.     Mental Status: He is alert.    Assessment/Plan: Recurrent small bowel/partial small bowel obstruction.  This appears to be an ongoing process.  He is being transferred to [facility].  Recommend NG tube decompression.  Recommend small bowel protocol which can be done when he arrives at [facility].  I will contact the on-call surgeon and  am going to let them be aware of his transfer.  [facility] primary team will follow-up  in AM.  Recommend continue bowel rest.  No signs of peritonitis or need for emergent laparotomy at this point.   Total time 45 minutes  Kamrin Spath A Ziasia Lenoir 08/22/2021, 7:19 PM

## 2021-08-22 NOTE — Assessment & Plan Note (Addendum)
Presents with abdominal pain for 1 day.  No nausea or vomiting.  Surprisingly had normal BM about 4 AM.  CT A/P concerning for SBO with transition point in RLQ.  Seems to be recurrent issue.  Hospitalized for the same last month and treated with NG tube.  He had recent laparotomy with cholecystectomy and resection of ischemic small bowel in 05/2021.  Initially refused NG tube but he agreed after I talk to him.  General surgery consulted in ED. ?-Admitted to [facility] given ESRD. ?-General surgery to manage ?-N.p.o. ?-D5-1/2NS at 50 cc an hour given ESRD and history of CHF ?-Change IV morphine to IV Dilaudid given ESRD. ?-IV Compazine as needed for nausea and emesis ?-IV Protonix ?

## 2021-08-22 NOTE — ED Notes (Signed)
Pt to CT

## 2021-08-22 NOTE — Assessment & Plan Note (Signed)
On low-dose Coreg at home.  Not on anticoagulation due to GI bleed/AVM ?-IV metoprolol 2.5 mg as needed ?

## 2021-08-22 NOTE — Assessment & Plan Note (Signed)
Residual 4.5 cm fluid collection in the anterior lower abdomen/pelvis.  Previously treated with drain placement, antibiotics and resolved ?-IR consulted for percutaneous drainage ?

## 2021-08-22 NOTE — ED Notes (Signed)
Unable to give Gastrografin d/t verification x-ray not resulted before [facility] arrival.  ?

## 2021-08-22 NOTE — Assessment & Plan Note (Signed)
Not in crisis. ?-Notify his hematologist/sickle cell provider in the morning ?

## 2021-08-22 NOTE — ED Provider Notes (Incomplete)
Admit bowel obstruction, if admit [facility], will need to consult [facility]. Discussed with dr Malachi Paradise, refused ng

## 2021-08-22 NOTE — Assessment & Plan Note (Signed)
Recent Labs  ?  08/04/21 ?0241 08/04/21 ?1649 08/05/21 ?0942 08/06/21 ?0219 08/07/21 ?1410 08/08/21 ?0123 08/09/21 ?3013 08/10/21 ?1438 08/11/21 ?0830 08/22/21 ?0945  ?HGB 7.8* 9.2* 7.3* 7.9* 8.1* 7.8* 6.9* 7.7* 9.0* 9.4*  ?Hgb higher than baseline.  Has history of upper GI bleed and AVM that was treated with APC in 07/2021. ?-Monitor H&H ? ?

## 2021-08-22 NOTE — Assessment & Plan Note (Signed)
Reportedly had full session today outpatient.  No cardiopulmonary symptoms.  Appears euvolemic except for BLE edema.  No indication for emergent HD. ?-Consult nephrology in the morning ?

## 2021-08-22 NOTE — ED Notes (Signed)
Radiology called for xray for NG verification. ?

## 2021-08-23 ENCOUNTER — Other Ambulatory Visit: Payer: Self-pay

## 2021-08-23 ENCOUNTER — Encounter (HOSPITAL_COMMUNITY): Payer: Self-pay | Admitting: Student

## 2021-08-23 ENCOUNTER — Inpatient Hospital Stay (HOSPITAL_COMMUNITY): Payer: HMO

## 2021-08-23 ENCOUNTER — Other Ambulatory Visit: Payer: Self-pay | Admitting: Family Medicine

## 2021-08-23 DIAGNOSIS — K56609 Unspecified intestinal obstruction, unspecified as to partial versus complete obstruction: Secondary | ICD-10-CM | POA: Diagnosis not present

## 2021-08-23 LAB — RENAL FUNCTION PANEL
Albumin: 1.9 g/dL — ABNORMAL LOW (ref 3.5–5.0)
Anion gap: 10 (ref 5–15)
BUN: 25 mg/dL — ABNORMAL HIGH (ref 8–23)
CO2: 29 mmol/L (ref 22–32)
Calcium: 8.4 mg/dL — ABNORMAL LOW (ref 8.9–10.3)
Chloride: 92 mmol/L — ABNORMAL LOW (ref 98–111)
Creatinine, Ser: 3.94 mg/dL — ABNORMAL HIGH (ref 0.61–1.24)
GFR, Estimated: 16 mL/min — ABNORMAL LOW (ref >60)
Glucose, Bld: 85 mg/dL (ref 70–99)
Phosphorus: 3.9 mg/dL (ref 2.5–4.6)
Potassium: 4.4 mmol/L (ref 3.5–5.1)
Sodium: 131 mmol/L — ABNORMAL LOW (ref 135–145)

## 2021-08-23 LAB — CBC
HCT: 21.7 % — ABNORMAL LOW (ref 39.0–52.0)
Hemoglobin: 7.6 g/dL — ABNORMAL LOW (ref 13.0–17.0)
MCH: 35 pg — ABNORMAL HIGH (ref 26.0–34.0)
MCHC: 35 g/dL (ref 30.0–36.0)
MCV: 100 fL (ref 80.0–100.0)
Platelets: 162 10*3/uL (ref 150–400)
RBC: 2.17 MIL/uL — ABNORMAL LOW (ref 4.22–5.81)
RDW: 22.1 % — ABNORMAL HIGH (ref 11.5–15.5)
WBC: 3.5 10*3/uL — ABNORMAL LOW (ref 4.0–10.5)
nRBC: 8 % — ABNORMAL HIGH (ref 0.0–0.2)

## 2021-08-23 LAB — HEPATITIS B SURFACE ANTIBODY,QUALITATIVE: Hep B S Ab: REACTIVE — AB

## 2021-08-23 LAB — HEPATITIS B SURFACE ANTIGEN: Hepatitis B Surface Ag: NONREACTIVE

## 2021-08-23 LAB — MAGNESIUM: Magnesium: 1.9 mg/dL (ref 1.7–2.4)

## 2021-08-23 MED ORDER — MAGNESIUM SULFATE IN D5W 1-5 GM/100ML-% IV SOLN
1.0000 g | Freq: Once | INTRAVENOUS | Status: AC
  Administered 2021-08-23: 1 g via INTRAVENOUS
  Filled 2021-08-23: qty 100

## 2021-08-23 MED ORDER — HEPARIN SODIUM (PORCINE) 1000 UNIT/ML DIALYSIS
1000.0000 [IU] | INTRAMUSCULAR | Status: DC | PRN
  Filled 2021-08-23: qty 1

## 2021-08-23 MED ORDER — SODIUM CHLORIDE 0.9 % IV SOLN: 100.0000 mL | INTRAVENOUS | Status: DC | PRN

## 2021-08-23 MED ORDER — LIDOCAINE HCL (PF) 1 % IJ SOLN: 5.0000 mL | INTRAMUSCULAR | Status: DC | PRN

## 2021-08-23 MED ORDER — LIDOCAINE-PRILOCAINE 2.5-2.5 % EX CREA
1.0000 "application " | TOPICAL_CREAM | CUTANEOUS | Status: DC | PRN
  Filled 2021-08-23: qty 5

## 2021-08-23 MED ORDER — ALTEPLASE 2 MG IJ SOLR: 2.0000 mg | Freq: Once | INTRAMUSCULAR | Status: DC | PRN

## 2021-08-23 MED ORDER — PENTAFLUOROPROP-TETRAFLUOROETH EX AERO
1.0000 "application " | INHALATION_SPRAY | CUTANEOUS | Status: DC | PRN
  Filled 2021-08-23: qty 103.5

## 2021-08-23 MED ORDER — DIATRIZOATE MEGLUMINE & SODIUM 66-10 % PO SOLN: 90.0000 mL | Freq: Once | ORAL | Status: DC

## 2021-08-23 MED ORDER — CHLORHEXIDINE GLUCONATE CLOTH 2 % EX PADS
6.0000 | MEDICATED_PAD | Freq: Every day | CUTANEOUS | Status: DC
  Administered 2021-08-24 – 2021-08-25 (×2): 6 via TOPICAL

## 2021-08-23 NOTE — Progress Notes (Signed)
12 Fr NGT reinserted to tight nare and placement confirmed by xray. Currently connected to LIWS as ordered ?

## 2021-08-23 NOTE — Progress Notes (Signed)
Pt pulled out NG tube MD informed will continue to monitor.pt ?

## 2021-08-23 NOTE — Progress Notes (Signed)
Progress Note     Subjective: Patient denies bowel function. Reports abdominal pain is maybe a little better. He was unable to tolerate NGT and removed. Denies n/v this AM. Asking if he might get something to eat.   Objective: Vital signs in last 24 hours: Temp:  [94.4 F (34.7 C)-98.2 F (36.8 C)] 97.5 F (36.4 C) (03/08 0720) Pulse Rate:  [78-93] 87 (03/08 0720) Resp:  [10-24] 16 (03/08 0720) BP: (101-158)/(68-102) 135/68 (03/08 0720) SpO2:  [98 %-100 %] 100 % (03/08 0720) Weight:  [58.1 kg] 58.1 kg (03/07 2133)    Intake/Output from previous day: 03/07 0701 - 03/08 0700 In: -  Out: 500 [Emesis/NG output:500] Intake/Output this shift: No intake/output data recorded.  PE: General: pleasant, WD, thin male who is laying in bed in NAD Heart: regular, rate, and rhythm.   Lungs: Respiratory effort nonlabored Abd: soft, NT, distended, surgical scar along midline with scab supraumbilically MS: all 4 extremities are symmetrical with no cyanosis, clubbing, or edema. Skin: warm and dry with no masses, lesions, or rashes Psych: A&Ox3 with an appropriate affect.    Lab Results:  Recent Labs    08/22/21 0945 08/23/21 0621  WBC 3.6* 3.5*  HGB 9.4* 7.6*  HCT 26.9* 21.7*  PLT 189 162   BMET Recent Labs    08/22/21 0945 08/22/21 1802 08/23/21 0621  NA 132*  --  131*  K 3.9  --  4.4  CL 91*  --  92*  CO2 27  --  29  GLUCOSE 83  --  85  BUN 21  --  25*  CREATININE 3.08* 3.53* 3.94*  CALCIUM 8.5*  --  8.4*   PT/INR No results for input(s): LABPROT, INR in the last 72 hours. CMP     Component Value Date/Time   NA 131 (L) 08/23/2021 0621   NA 135 05/24/2020 1119   K 4.4 08/23/2021 0621   CL 92 (L) 08/23/2021 0621   CO2 29 08/23/2021 0621   GLUCOSE 85 08/23/2021 0621   BUN 25 (H) 08/23/2021 0621   BUN 25 05/24/2020 1119   CREATININE 3.94 (H) 08/23/2021 0621   CREATININE 2.90 (H) 08/11/2015 1359   CALCIUM 8.4 (L) 08/23/2021 0621   PROT 7.7 08/22/2021 0945    PROT 7.5 05/24/2020 1119   ALBUMIN 1.9 (L) 08/23/2021 0621   ALBUMIN 4.1 05/24/2020 1119   AST 38 08/22/2021 0945   ALT 22 08/22/2021 0945   ALKPHOS 173 (H) 08/22/2021 0945   BILITOT 1.6 (H) 08/22/2021 0945   BILITOT 2.4 (H) 05/24/2020 1119   GFRNONAA 16 (L) 08/23/2021 0621   GFRNONAA 23 (L) 08/11/2015 1359   GFRAA 24 (L) 05/24/2020 1119   GFRAA 27 (L) 08/11/2015 1359   Lipase     Component Value Date/Time   LIPASE 29 08/22/2021 0945       Studies/Results: DG Abd Portable 1V-Small Bowel Obstruction Protocol-initial, 8 hr delay  Result Date: 08/23/2021 CLINICAL DATA:  Small bowel obstruction EXAM: PORTABLE ABDOMEN - 1 VIEW COMPARISON:  CT the abdomen from yesterday FINDINGS: Dilated small bowel loops from known obstruction. No progression of contrast beyond the stomach and small bowel where there is dilution. Paucity of colonic gas. Contrast in the urinary bladder from recent enhanced scan. IMPRESSION: Ongoing small bowel obstruction. No enteric contrast is seen to reach the colon. Electronically Signed   By: Jorje Guild M.D.   On: 08/23/2021 07:46   DG Abd Portable 1V-Small Bowel Protocol-Position Verification  Result Date:  08/22/2021 CLINICAL DATA:  NG tube placement. EXAM: PORTABLE ABDOMEN - 1 VIEW COMPARISON:  CTA abdomen and pelvis 08/22/2021 FINDINGS: Nasogastric tube tip at the level of the proximal body of the stomach. Dilated small bowel loops are unchanged in the upper abdomen. Small radiopaque densities again noted in the stomach and surgical clips in the right upper quadrant. IMPRESSION: 1. Enteric tube tip in the proximal body of the stomach. 2. Stable dilated small bowel compatible with small-bowel obstruction. Electronically Signed   By: Ronney Asters M.D.   On: 08/22/2021 21:00   CT Angio Abd/Pel W and/or Wo Contrast  Result Date: 08/22/2021 CLINICAL DATA:  Mesenteric ischemia, acute.  GI bleed. EXAM: CTA ABDOMEN AND PELVIS WITHOUT AND WITH CONTRAST TECHNIQUE:  Multidetector CT imaging of the abdomen and pelvis was performed using the standard protocol during bolus administration of intravenous contrast. Multiplanar reconstructed images and MIPs were obtained and reviewed to evaluate the vascular anatomy. RADIATION DOSE REDUCTION: This exam was performed according to the departmental dose-optimization program which includes automated exposure control, adjustment of the mA and/or kV according to patient size and/or use of iterative reconstruction technique. CONTRAST:  176mL OMNIPAQUE IOHEXOL 350 MG/ML SOLN COMPARISON:  08/11/2021 and 08/04/2021 FINDINGS: VASCULAR Aorta: Normal caliber of the abdominal aorta with mild atherosclerotic disease. No significant stenosis. No evidence for dissection. Celiac: Mild narrowing of the proximal celiac trunk related to the configuration. Left gastric artery originates directly from the aorta just proximal to the celiac trunk. SMA: Patent without evidence of aneurysm, dissection, vasculitis or significant stenosis. Renals: Both renal arteries are patent without evidence of aneurysm, dissection, vasculitis, fibromuscular dysplasia or significant stenosis. IMA: Patent Inflow: Patent without evidence of aneurysm, dissection, vasculitis or significant stenosis. Proximal Outflow: Proximal femoral arteries are patent bilaterally. Veins: Hepatic veins and portal veins are patent. Main mesenteric veins are patent. Review of the MIP images confirms the above findings. NON-VASCULAR Lower chest: Small right pleural effusion and trace left pleural fluid. Cardiomegaly. Small amount of atelectasis at both lung bases. Hepatobiliary: Cholecystectomy. Normal appearance of the liver. Distal common bile duct measures approximately 8 mm and stable. Pancreas: Unremarkable. No pancreatic ductal dilatation or surrounding inflammatory changes. 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  Adrenals/Urinary Tract: Limited evaluation of the adrenal glands without gross abnormality. Evidence for bilateral renal cysts. Cortical thinning in both kidneys. No hydronephrosis. Small amount of fluid in the urinary bladder. Stomach/Bowel: Again noted is an endoscopic gastric clip. Diffuse small bowel dilatation with fluid with air-fluid levels. There is stool throughout the colon. Surgical bowel anastomosis in the right abdomen is distended with stool-like material. Small bowel distal to the anastomosis is decompressed and this is likely the transition point for the small bowel obstruction. High-density material in the appendix. Lymphatic: No significant lymph node enlargement in the abdomen or pelvis. Limited evaluation for lymphadenopathy due to the lack of intra-abdominal fat. Reproductive: Prostate is unremarkable. Other: Difficult to exclude a small amount of ascites in the abdomen and pelvis due to the diffuse subcutaneous edema. No evidence for free air. Previously, the patient had a fluid collection in the anterior lower abdomen. The drain has been removed from this area but there is a small residual fluid collection that measures 4.5 x 1.4 x 1.1 cm on sequence 7 image 63. There is no gas within the small residual collection. Musculoskeletal: Diffuse sclerosis throughout the bones and compatible with history of sickle cell disease and end-stage  renal disease. IMPRESSION: VASCULAR 1. Atherosclerotic disease in the abdomen and pelvis without significant stenosis. 2. Main visceral arteries are patent. No evidence for mesenteric ischemia. 3. No evidence for active GI bleeding. Endoscopic gastric clip is present. NON-VASCULAR 1. Diffuse small bowel dilatation and findings are suggestive for a high-grade small bowel obstruction. Surgical anastomosis in the right lower quadrant of the abdomen which contains stool-like material and there appears to be a transition point distal to this anastomosis. 2. Difficult  to exclude a small amount of ascites in the abdomen and pelvis. 3. Residual fluid collection in the anterior lower abdomen/pelvis. Previously, there was a percutaneous drain in this area. Fluid collection measures up to 4.5 cm. No evidence for gas within the collection. 4. Small bilateral pleural effusions, right side greater than left. These results were called by telephone at the time of interpretation on 08/22/2021 at 4:55 pm to provider MATTHEW TRIFAN , who verbally acknowledged these results. Electronically Signed   By: Markus Daft M.D.   On: 08/22/2021 16:57    Anti-infectives: Anti-infectives (From admission, onward)    Start     Dose/Rate Route Frequency Ordered Stop   08/22/21 1915  piperacillin-tazobactam (ZOSYN) IVPB 2.25 g        2.25 g 100 mL/hr over 30 Minutes Intravenous Every 8 hours 08/22/21 1902          Assessment/Plan 3 months s/p ex lap with cholecystectomy, SBR SBO vs ileus  Recurrent Intra-abdominal fluid collection s/p previous drain  - CT 3/7 with sbo and possible transition point distal to small bowel anastomosis in RLQ and residual fluid collection in anterior lower abdomen/pelvis measuring 4.5 cm - SBO protocol started 3/7 and 8h delay film with ongoing SBO and no contrast in the colon  - IR consult for drainage of fluid collection - keep NPO - goal K >4.0 and Mg >2.0 to optimize bowel function  - mobilize as tolerated   FEN - NPO, IVF @50  cc/h VTE - SQH ID - zosyn 3/7>>   Recent UGI bleed ESRD on HD CHF Severe MR, TR A fib HTN Sickle Cell disease Anemia  LOS: 1 day    Norm Parcel, [facility] Surgery 08/23/2021, 8:01 AM Please see Amion for pager number during day hours 7:00am-4:30pm

## 2021-08-23 NOTE — Progress Notes (Signed)
Initial Nutrition Assessment ? ?DOCUMENTATION CODES:  ? ?Severe malnutrition in context of chronic illness, Underweight ? ?INTERVENTION:  ?Renavit daily ?Pending diet advancement, will add Nepro Shake po BID, each supplement provides 425 kcal and 19 grams protein ?Recommend TPN if diet cannot be advanced within the next 48 hours  ?Encourage adequate PO intake when diet is advanced  ? ? ?NUTRITION DIAGNOSIS:  ? ?Severe Malnutrition related to chronic illness (ESRD on HD) as evidenced by severe fat depletion, severe muscle depletion, percent weight loss (12% within the last two months). ? ? ?GOAL:  ? ?Patient will meet greater than or equal to 90% of their needs ? ? ?MONITOR:  ? ?PO intake, Supplement acceptance, Labs, I & O's, Weight trends ? ?REASON FOR ASSESSMENT:  ? ?Other (Comment) (Low BMI) ?  ? ?ASSESSMENT:  ? ?Pt is a 66 year old made with PMH significant of ESRD on HD, sickle cell disease, legally blind, A-fib, upper GI bleed due to AVMs, CHF, recent GI bleed and small bowel obstruction s/p bowel resection and open cholecystectomy and recent hospitalization from SBO resolved with NGT and pelvic abscess treated with drain placement and antibiotics who presented to the ED with progressive abdominal pain for one day and admitted for SBO. ? ?Patient is currently on a NPO diet.  ? ?Last HD documented on 2/23 with net UF of 507 mL.  ? ?Met with pt at bedside. Pt's family member present in room during visit. Per pt, pt reports that he endorses an appetite right and states that he is hungry. Pt was recently discharged from the hospital, but reports that prior to this admission, that he was eating at home. Pt reports eating chicken, breakfast burritos, pizza, chili from [facility] and his meals that he gets delivered to his home. Pt lives alone and ambulates with a walker. Pt reports that he started having abdominal pain on Monday and that on Tuesday (3/7), he went to HD for his whole session. Per pt, after HD, he was  sent over to [facility] and then transferred to [facility]. Pt reports that his last meal was Monday night at home and that he ate a slice of pizza. Pt complains of mild to moderate abdominal pain at this time and reports that the pain medications have been helping. Since admission, pt has been only consuming small sips of water. Pt reports a UBW of 157 pounds and reports that he has lost a lot of weight, but unsure of how much. Pt's cousin reports her concerns that pt is not eating healthy food choices and because he is legally blind, she is concerned that the food that he eats at his home may be molded and/or old and suspects that this may be a reason he is having these health complications. Per pt's cousin, she is trying to talk to social work to come up with a plan for the pt to either have home health or pt go to a assisted living facility.  ? ?Discussed with pt the importance of good nutrition and adequate PO intake to maintain energy/strength and to meet nutritional needs. Pt reports that he has had TPN in the past and would be open to receiving it again, if needed. Pt agreeable to oral nutrition supplementations at this time and MVI.  ? ?Current wt: 58.1 kg (08/22/21)  ?Pt's weight on 06/10/21 was 66.2 kg and pt has experienced a 12% weight loss within the last 2 months ? ?Labs reviewed and include:  ?Na: 131,  BUN: 25, Creatinine: 3.94, Corrected calcium: 10, Magnesium: 1.9, Phosphorus: 3.9  ? ?Medications reviewed and include: ? pantoprazole (PROTONIX) IV  40 mg Intravenous Daily  ? ?Continuous Infusions: ? dextrose 5 % and 0.45% NaCl 50 mL/hr at 08/22/21 1815  ? piperacillin-tazobactam (ZOSYN)  IV 2.25 g (08/23/21 0556)  ?Dextrose containing IV fluid provides 204 kcal per day.  ? ?NUTRITION - FOCUSED PHYSICAL EXAM: ? ?Flowsheet Row Most Recent Value  ?Orbital Region Moderate depletion  ?Upper Arm Region Severe depletion  ?Thoracic and Lumbar Region Severe depletion  ?Buccal Region Moderate depletion   ?Temple Region Severe depletion  ?Clavicle Bone Region Severe depletion  ?Clavicle and Acromion Bone Region Severe depletion  ?Scapular Bone Region Severe depletion  ?Dorsal Hand Severe depletion  ?Patellar Region Severe depletion  ?Anterior Thigh Region Severe depletion  ?Posterior Calf Region Severe depletion  ?Edema (RD Assessment) None  ?Hair Reviewed  ?Eyes Reviewed  ?Mouth Reviewed  ?Skin Reviewed  ?Nails Reviewed  ? ?  ? ? ?Diet Order:   ?Diet Order   ? ?       ?  Diet NPO time specified  Diet effective now       ?  ? ?  ?  ? ?  ? ? ?EDUCATION NEEDS:  ? ?Education needs have been addressed ? ?Skin:  Skin Assessment: Skin Integrity Issues: ?Skin Integrity Issues:: Stage II, Incisions ?Stage II: mid sacrum,left sacrum ?Incisions: abdomen ? ?Last BM:  3/7 ? ?Height:  ? ?Ht Readings from Last 1 Encounters:  ?08/01/21 5' 11"  (1.803 m)  ? ? ?Weight:  ? ?Wt Readings from Last 1 Encounters:  ?08/22/21 58.1 kg  ? ? ?Ideal Body Weight:    ? ?BMI:  Body mass index is 17.86 kg/m?. ? ?Estimated Nutritional Needs:  ? ?Kcal:  1900 - 2100 ? ?Protein:  100 - 115 grams ? ?Fluid:  1000 mL + UOP ? ? ? ?Maryruth Hancock, Dietetic Intern ?08/23/2021 4:02 PM ?

## 2021-08-23 NOTE — Consult Note (Addendum)
[facility] Renal Consultation Note    Indication for Consultation:  Management of ESRD/hemodialysis; anemia, hypertension/volume and secondary hyperparathyroidism  JSH:FWYOVZ, Olugbemiga E, MD  HPI: Patrick [name] is a 66 y.o. male with ESRD on HD TTS at [facility]. His past medical history is significant for CHF, sickle cell disease, permanent A-fib, upper GI bleed due to AVMs, laparotomy/cholecystectomy/ischemic small bowel resection in 12/22 and recent hospitalization from SBO that resolved with NGT and pelvic abscess that was treated with drain placement and antibiotics.  Patient presented to the ED after HD c/o severe ABD pain and distention.  CT ABD/Pelvis shows small bowel obstruction and small residual fluid collection. Surgery is following and IR consulted for possible aspiration/drain placement. Noted previous hospitalizations for this issue. Noted Laparoscopy with laparotomy in Dec 2022. Seen and examined patient at bedside. He reports ABD tightness. He denies SOB and CP. Current VS and labs are stable. Plan for HD tomorrow per his usual schedule.  Past Medical History:  Diagnosis Date   Blood dyscrasia    Blood transfusion    "I've had a bunch of them"   CHF (congestive heart failure) ([place])    followed by hf clinic   Chronic kidney disease (CKD), stage III (moderate) ([place])    Dialysis patient ([place]) 04/2019   AV fistula (right arm)   Gout    Headache(784.0)    "probably weekly" (01/13/2014)   Legally blind    Shortness of breath dyspnea    Sickle cell disease, type SS (HCC)    Swelling abdomen    Swelling of extremity, left    Swelling of extremity, right    Past Surgical History:  Procedure Laterality Date   A/V FISTULAGRAM Right 07/29/2020   Procedure: A/V FISTULAGRAM - Right Arm;  Surgeon: Angelia Mould, MD;  Location: [place] [facility];  Service: Cardiovascular;  Laterality: Right;   BASCILIC VEIN TRANSPOSITION Right  04/12/2014   Procedure: BASILIC VEIN TRANSPOSITION;  Surgeon: Elam Dutch, MD;  Location: [place];  Service: Vascular;  Laterality: Right;   BIOPSY  08/04/2020   Procedure: BIOPSY;  Surgeon: Ladene Artist, MD;  Location: [facility] ENDOSCOPY;  Service: Gastroenterology;;   BIOPSY  06/28/2021   Procedure: BIOPSY;  Surgeon: Jerene Bears, MD;  Location: [facility] ENDOSCOPY;  Service: Gastroenterology;;   BOWEL RESECTION N/A 05/24/2021   Procedure: SMALL BOWEL RESECTION;  Surgeon: Rolm Bookbinder, MD;  Location: [place];  Service: General;  Laterality: N/A;   CARDIAC CATHETERIZATION  05/2011   CARDIOVERSION N/A 06/05/2018   Procedure: CARDIOVERSION;  Surgeon: Jolaine Artist, MD;  Location: [place];  Service: Cardiovascular;  Laterality: N/A;   CARDIOVERSION N/A 10/06/2019   Procedure: CARDIOVERSION;  Surgeon: Jolaine Artist, MD;  Location: [facility] ENDOSCOPY;  Service: Cardiovascular;  Laterality: N/A;   CHOLECYSTECTOMY N/A 05/24/2021   Procedure: OPEN CHOLECYSTECTOMY;  Surgeon: Rolm Bookbinder, MD;  Location: [place];  Service: General;  Laterality: N/A;   COLONOSCOPY WITH PROPOFOL N/A 09/24/2017   Procedure: COLONOSCOPY WITH PROPOFOL;  Surgeon: Jackquline Denmark, MD;  Location: [place];  Service: Endoscopy;  Laterality: N/A;   ESOPHAGOGASTRODUODENOSCOPY N/A 09/19/2017   Procedure: ESOPHAGOGASTRODUODENOSCOPY (EGD);  Surgeon: Jackquline Denmark, MD;  Location: [facility] ENDOSCOPY;  Service: Endoscopy;  Laterality: N/A;   ESOPHAGOGASTRODUODENOSCOPY N/A 05/29/2021   Procedure: ESOPHAGOGASTRODUODENOSCOPY (EGD);  Surgeon: Milus Banister, MD;  Location: [facility] ENDOSCOPY;  Service: Gastroenterology;  Laterality: N/A;   ESOPHAGOGASTRODUODENOSCOPY (EGD) WITH PROPOFOL N/A 08/04/2020   Procedure: ESOPHAGOGASTRODUODENOSCOPY (EGD) WITH PROPOFOL;  Surgeon: Ladene Artist, MD;  Location: [place];  Service: Gastroenterology;  Laterality: N/A;   ESOPHAGOGASTRODUODENOSCOPY (EGD) WITH PROPOFOL N/A 06/28/2021   Procedure:  ESOPHAGOGASTRODUODENOSCOPY (EGD) WITH PROPOFOL;  Surgeon: Jerene Bears, MD;  Location[facility]COPY;  Service: Gastroenterology;  Laterality: N/A;   ESOPHAGOGASTRODUODENOSCOPY (EGD) WITH PROPOFOL N/A 08/02/2021   Procedure: ESOPHAGOGASTRODUODENOSCOPY (EGD) WITH PROPOFOL;  Surgeon: Lavena Bullion, DO;  Location: [place];  Service: Gastroenterology;  Laterality: N/A;   ESOPHAGOGASTRODUODENOSCOPY (EGD) WITH PROPOFOL N/A 08/10/2021   Procedure: ESOPHAGOGASTRODUODENOSCOPY (EGD) WITH PROPOFOL;  Surgeon: Jackquline Denmark, MD;  Location: [place];  Service: Gastroenterology;  Laterality: N/A;   HEMOSTASIS CLIP PLACEMENT  05/29/2021   Procedure: HEMOSTASIS CLIP PLACEMENT;  Surgeon: Milus Banister, MD;  Location: [facility] ENDOSCOPY;  Service: Gastroenterology;;   HOT HEMOSTASIS N/A 05/29/2021   Procedure: HOT HEMOSTASIS (ARGON PLASMA COAGULATION/BICAP);  Surgeon: Milus Banister, MD;  Location: [facility] ENDOSCOPY;  Service: Gastroenterology;  Laterality: N/A;   HOT HEMOSTASIS N/A 06/28/2021   Procedure: HOT HEMOSTASIS (ARGON PLASMA COAGULATION/BICAP);  Surgeon: Jerene Bears, MD;  Location: [facility] ENDOSCOPY;  Service: Gastroenterology;  Laterality: N/A;   HOT HEMOSTASIS N/A 08/02/2021   Procedure: HOT HEMOSTASIS (ARGON PLASMA COAGULATION/BICAP);  Surgeon: Lavena Bullion, DO;  Location: [facility] ENDOSCOPY;  Service: Gastroenterology;  Laterality: N/A;   HOT HEMOSTASIS N/A 08/10/2021   Procedure: HOT HEMOSTASIS (ARGON PLASMA COAGULATION/BICAP);  Surgeon: Jackquline Denmark, MD;  Location: [facility] ENDOSCOPY;  Service: Gastroenterology;  Laterality: N/A;   IR PERC TUN PERIT CATH WO PORT S&I /IMAG  05/23/2021   IR REMOVAL TUN CV CATH W/O FL  06/10/2021   IR US GUIDE VASC ACCESS RIGHT  05/23/2021   LAPAROSCOPY N/A 05/24/2021   Procedure: LAPAROSCOPY DIAGNOSTIC;  Surgeon: Rolm Bookbinder, MD;  Location: [place];  Service: General;  Laterality: N/A;   LAPAROTOMY N/A 05/24/2021   Procedure: EXPLORATORY LAPAROTOMY;  Surgeon: Rolm Bookbinder, MD;  Location: [place];  Service: General;  Laterality: N/A;   LEFT AND RIGHT HEART CATHETERIZATION WITH CORONARY ANGIOGRAM N/A 06/11/2011   Procedure: LEFT AND RIGHT HEART CATHETERIZATION WITH CORONARY ANGIOGRAM;  Surgeon: Larey Dresser, MD;  Location: [facility] CATH LAB;  Service: Cardiovascular;  Laterality: N/A;   PERIPHERAL VASCULAR BALLOON ANGIOPLASTY Right 07/29/2020   Procedure: PERIPHERAL VASCULAR BALLOON ANGIOPLASTY;  Surgeon: Angelia Mould, MD;  Location: [place] CV LAB;  Service: Cardiovascular;  Laterality: Right;  arm fistula   REVISON OF ARTERIOVENOUS FISTULA Right 08/08/2020   Procedure: ANEURYSM EXCISION OF RIGHT UPPER EXTREMITY ARTERIOVENOUS FISTULA;  Surgeon: Angelia Mould, MD;  Location: [facility] OR;  Service: Vascular;  Laterality: Right;   SCLEROTHERAPY  05/29/2021   Procedure: Clide Deutscher;  Surgeon: Milus Banister, MD;  Location: [facility] ENDOSCOPY;  Service: Gastroenterology;;   Lavell Islam REMOVAL  05/29/2021   Procedure: STENT REMOVAL;  Surgeon: Milus Banister, MD;  Location: [facility] ENDOSCOPY;  Service: Gastroenterology;;   TEE WITHOUT CARDIOVERSION N/A 12/27/2015   Procedure: TRANSESOPHAGEAL ECHOCARDIOGRAM (TEE);  Surgeon: Sueanne Margarita, MD;  Location: [facility] ENDOSCOPY;  Service: Cardiovascular;  Laterality: N/A;   Family History  Problem Relation Age of Onset   Alcohol abuse Mother    Liver disease Mother    Cirrhosis Mother    Heart attack Mother    Social History:  reports that he quit smoking about 5 years ago. His smoking use included cigarettes. He has a 20.50 pack-year smoking history. He has never used smokeless tobacco. He reports that he does not currently use alcohol after a  past usage of about 3.0 standard drinks per week. He reports that he does not use drugs. No Known Allergies Prior to Admission medications   Medication Sig Start Date End Date Taking? Authorizing Provider  acetaminophen (TYLENOL) 500 MG tablet Take 500 mg by mouth every 6 (six)  hours as needed for moderate pain.   Yes [provider]  allopurinol (ZYLOPRIM) 100 MG tablet TAKE 1 TABLET BY MOUTH EVERY DAY Patient taking differently: Take 100 mg by mouth daily. 07/13/21  Yes Dorena Dew, FNP  AURYXIA 1 GM 210 MG(Fe) tablet Take 210 mg by mouth 3 (three) times daily. 02/11/21  Yes [provider]  folic acid (FOLVITE) 1 MG tablet TAKE 1 TABLET BY MOUTH EVERY DAY Patient taking differently: Take 1 mg by mouth daily. 02/22/21  Yes Dorena Dew, FNP  hydroxyurea (HYDREA) 500 MG capsule Take 1 capsule (500 mg total) by mouth daily. TAKE 1 CAPSULE BY MOUTH EVERY DAY WITH FOOD TO MINIMIZE GI SIDE EFFECTS Patient taking differently: Take 500 mg by mouth daily before breakfast. 01/31/21  Yes Dorena Dew, FNP  lidocaine-prilocaine (EMLA) cream Apply 1 application topically daily as needed (port access).  09/08/19  Yes [provider]  oxycodone (OXY-IR) 5 MG capsule Take 2 capsules (10 mg total) by mouth every 6 (six) hours as needed for pain. 07/31/21  Yes Dorena Dew, FNP  pantoprazole (PROTONIX) 40 MG tablet TAKE 1 TABLET BY MOUTH TWICE A DAY 08/21/21  Yes Willia Craze, NP  tiZANidine (ZANAFLEX) 4 MG tablet Take 0.5 tablets (2 mg total) by mouth every 8 (eight) hours as needed for muscle spasms. 07/18/21  Yes Dorena Dew, FNP  Vitamin D, Ergocalciferol, (DRISDOL) 1.25 MG (50000 UNIT) CAPS capsule TAKE 1 CAPSULE BY MOUTH ONE TIME PER WEEK Patient taking differently: Take 50,000 Units by mouth once a week. Monday 05/15/21  Yes Dorena Dew, FNP  albuterol (VENTOLIN HFA) 108 (90 Base) MCG/ACT inhaler Inhale 2 puffs into the lungs every 6 (six) hours as needed for wheezing or shortness of breath. Patient not taking: Reported on 08/22/2021 05/09/20   Freddi Starr, MD  carvedilol (COREG) 3.125 MG tablet Take 1 tablet (3.125 mg total) by mouth 2 (two) times daily. Patient not taking: Reported on 08/22/2021 08/11/21   Jonetta Osgood, MD  Glycerin-Hypromellose-PEG 400 (VISINE DRY EYE OP) Place 1 drop into both eyes as needed (dryness).    [provider]   Current Facility-Administered Medications  Medication Dose Route Frequency Provider Last Rate Last Admin   dextrose 5 %-0.45 % sodium chloride infusion   Intravenous Continuous Wendee Beavers T, MD 50 mL/hr at 08/22/21 1815 New Bag at 08/22/21 1815   heparin injection 5,000 Units  5,000 Units Subcutaneous Q8H Gonfa, Taye T, MD       HYDROmorphone (DILAUDID) injection 0.5 mg  0.5 mg Intravenous Q3H PRN Wendee Beavers T, MD   0.5 mg at 08/23/21 1451   metoprolol tartrate (LOPRESSOR) injection 2.5 mg  2.5 mg Intravenous Q6H PRN Gonfa, Taye T, MD       pantoprazole (PROTONIX) injection 40 mg  40 mg Intravenous Daily Wendee Beavers T, MD   40 mg at 08/23/21 0939   piperacillin-tazobactam (ZOSYN) IVPB 2.25 g  2.25 g Intravenous Q8H Wofford, Drew A, RPH 100 mL/hr at 08/23/21 1447 2.25 g at 08/23/21 1447   prochlorperazine (COMPAZINE) injection 5 mg  5 mg Intravenous Q6H PRN Mercy Riding, MD  Labs: Basic Metabolic Panel: Recent Labs  Lab 08/22/21 0945 08/22/21 1802 08/23/21 0621  NA 132*  --  131*  K 3.9  --  4.4  CL 91*  --  92*  CO2 27  --  29  GLUCOSE 83  --  85  BUN 21  --  25*  CREATININE 3.08* 3.53* 3.94*  CALCIUM 8.5*  --  8.4*  PHOS  --   --  3.9   Liver Function Tests: Recent Labs  Lab 08/22/21 0945 08/23/21 0621  AST 38  --   ALT 22  --   ALKPHOS 173*  --   BILITOT 1.6*  --   PROT 7.7  --   ALBUMIN 2.4* 1.9*   Recent Labs  Lab 08/22/21 0945  LIPASE 29   No results for input(s): AMMONIA in the last 168 hours. CBC: Recent Labs  Lab 08/22/21 0945 08/23/21 0621  WBC 3.6* 3.5*  NEUTROABS 2.4  --   HGB 9.4* 7.6*  HCT 26.9* 21.7*  MCV 102.3* 100.0  PLT 189 162   Cardiac Enzymes: No results for input(s): CKTOTAL, CKMB, CKMBINDEX, TROPONINI in the last 168 hours. CBG: No results for input(s): GLUCAP in the last 168 hours. Iron  Studies: No results for input(s): IRON, TIBC, TRANSFERRIN, FERRITIN in the last 72 hours. Studies/Results: DG Abd Portable 1V  Result Date: 08/23/2021 CLINICAL DATA:  NG placement EXAM: PORTABLE ABDOMEN - 1 VIEW COMPARISON:  08/23/2021 FINDINGS: NG has been placed with the tip in the lateral body of the stomach. Mild small bowel dilatation unchanged. IMPRESSION: NG tube in the lateral body of the stomach. Mild small bowel dilatation unchanged. Electronically Signed   By: Franchot Gallo M.D.   On: 08/23/2021 11:39   DG Abd Portable 1V-Small Bowel Obstruction Protocol-initial, 8 hr delay  Result Date: 08/23/2021 CLINICAL DATA:  Small bowel obstruction EXAM: PORTABLE ABDOMEN - 1 VIEW COMPARISON:  CT the abdomen from yesterday FINDINGS: Dilated small bowel loops from known obstruction. No progression of contrast beyond the stomach and small bowel where there is dilution. Paucity of colonic gas. Contrast in the urinary bladder from recent enhanced scan. IMPRESSION: Ongoing small bowel obstruction. No enteric contrast is seen to reach the colon. Electronically Signed   By: Jorje Guild M.D.   On: 08/23/2021 07:46   DG Abd Portable 1V-Small Bowel Protocol-Position Verification  Result Date: 08/22/2021 CLINICAL DATA:  NG tube placement. EXAM: PORTABLE ABDOMEN - 1 VIEW COMPARISON:  CTA abdomen and pelvis 08/22/2021 FINDINGS: Nasogastric tube tip at the level of the proximal body of the stomach. Dilated small bowel loops are unchanged in the upper abdomen. Small radiopaque densities again noted in the stomach and surgical clips in the right upper quadrant. IMPRESSION: 1. Enteric tube tip in the proximal body of the stomach. 2. Stable dilated small bowel compatible with small-bowel obstruction. Electronically Signed   By: Ronney Asters M.D.   On: 08/22/2021 21:00   CT Angio Abd/Pel W and/or Wo Contrast  Result Date: 08/22/2021 CLINICAL DATA:  Mesenteric ischemia, acute.  GI bleed. EXAM: CTA ABDOMEN AND PELVIS  WITHOUT AND WITH CONTRAST TECHNIQUE: Multidetector CT imaging of the abdomen and pelvis was performed using the standard protocol during bolus administration of intravenous contrast. Multiplanar reconstructed images and MIPs were obtained and reviewed to evaluate the vascular anatomy. RADIATION DOSE REDUCTION: This exam was performed according to the departmental dose-optimization program which includes automated exposure control, adjustment of the mA and/or kV according to patient size and/or use  of iterative reconstruction technique. CONTRAST:  189mL OMNIPAQUE IOHEXOL 350 MG/ML SOLN COMPARISON:  08/11/2021 and 08/04/2021 FINDINGS: VASCULAR Aorta: Normal caliber of the abdominal aorta with mild atherosclerotic disease. No significant stenosis. No evidence for dissection. Celiac: Mild narrowing of the proximal celiac trunk related to the configuration. Left gastric artery originates directly from the aorta just proximal to the celiac trunk. SMA: Patent without evidence of aneurysm, dissection, vasculitis or significant stenosis. Renals: Both renal arteries are patent without evidence of aneurysm, dissection, vasculitis, fibromuscular dysplasia or significant stenosis. IMA: Patent Inflow: Patent without evidence of aneurysm, dissection, vasculitis or significant stenosis. Proximal Outflow: Proximal femoral arteries are patent bilaterally. Veins: Hepatic veins and portal veins are patent. Main mesenteric veins are patent. Review of the MIP images confirms the above findings. NON-VASCULAR Lower chest: Small right pleural effusion and trace left pleural fluid. Cardiomegaly. Small amount of atelectasis at both lung bases. Hepatobiliary: Cholecystectomy. Normal appearance of the liver. Distal common bile duct measures approximately 8 mm and stable. Pancreas: Unremarkable. No pancreatic ductal dilatation or surrounding inflammatory changes. Spleen: Again noted is a small calcified spleen and suggestive for an  autosplenectomy from sickle cell disease. Adrenals/Urinary Tract: Limited evaluation of the adrenal glands without gross abnormality. Evidence for bilateral renal cysts. Cortical thinning in both kidneys. No hydronephrosis. Small amount of fluid in the urinary bladder. Stomach/Bowel: Again noted is an endoscopic gastric clip. Diffuse small bowel dilatation with fluid with air-fluid levels. There is stool throughout the colon. Surgical bowel anastomosis in the right abdomen is distended with stool-like material. Small bowel distal to the anastomosis is decompressed and this is likely the transition point for the small bowel obstruction. High-density material in the appendix. Lymphatic: No significant lymph node enlargement in the abdomen or pelvis. Limited evaluation for lymphadenopathy due to the lack of intra-abdominal fat. Reproductive: Prostate is unremarkable. Other: Difficult to exclude a small amount of ascites in the abdomen and pelvis due to the diffuse subcutaneous edema. No evidence for free air. Previously, the patient had a fluid collection in the anterior lower abdomen. The drain has been removed from this area but there is a small residual fluid collection that measures 4.5 x 1.4 x 1.1 cm on sequence 7 image 63. There is no gas within the small residual collection. Musculoskeletal: Diffuse sclerosis throughout the bones and compatible with history of sickle cell disease and end-stage renal disease. IMPRESSION: VASCULAR 1. Atherosclerotic disease in the abdomen and pelvis without significant stenosis. 2. Main visceral arteries are patent. No evidence for mesenteric ischemia. 3. No evidence for active GI bleeding. Endoscopic gastric clip is present. NON-VASCULAR 1. Diffuse small bowel dilatation and findings are suggestive for a high-grade small bowel obstruction. Surgical anastomosis in the right lower quadrant of the abdomen which contains stool-like material and there appears to be a transition point  distal to this anastomosis. 2. Difficult to exclude a small amount of ascites in the abdomen and pelvis. 3. Residual fluid collection in the anterior lower abdomen/pelvis. Previously, there was a percutaneous drain in this area. Fluid collection measures up to 4.5 cm. No evidence for gas within the collection. 4. Small bilateral pleural effusions, right side greater than left. These results were called by telephone at the time of interpretation on 08/22/2021 at 4:55 pm to provider MATTHEW TRIFAN , who verbally acknowledged these results. Electronically Signed   By: Markus Daft M.D.   On: 08/22/2021 16:57    Physical Exam: Vitals:   08/22/21 2133 08/23/21 0404 08/23/21 0720 08/23/21  1549  BP: (!) 158/75 137/76 135/68 133/72  Pulse: 88 85 87 83  Resp:  17 16 16   Temp: 97.7 F (36.5 C) 98.2 F (36.8 C) (!) 97.5 F (36.4 C) 98.1 F (36.7 C)  TempSrc: Oral Oral Oral Oral  SpO2: 100% 100% 100% 100%  Weight: 58.1 kg        General: WDWN NAD Head: NCAT sclera not icteric Lungs: CTA bilaterally. No wheeze, rales or rhonchi. Breathing is unlabored. Heart: RRR. No murmur, rubs or gallops.  Abdomen: round, distended, tight, no guarding Lower extremities: no edema BLLE  Neuro: AAOx3. Moves all extremities spontaneously. Dialysis Access: R AVF (+) B/T  Dialysis Orders:  TTS - [facility] ([place])  4hrs, BFR 400, DFR 500,  EDW 52.5kg, 3K/ 2.5Ca No heparin bolus Mircera 225 mcg q2wks - last 08/19/21 Sensipar 30mg  3x weekly  Assessment/Plan: Recurrent SBO-continue conservative management, on IV Protonix and IV Compazine. Surgery following 2.   ESRD - on HD TTS. Plan for HD tomorrow per his usual schedule 3.   Hypertension/volume  - Bps stable, not in severe overload 4.   Anemia of CKD - Hgb 7.6, ESA not due yet, transfuse PRN if Hgb<7 5.   Secondary Hyperparathyroidism -  PO4 at goal.  6.    Nutrition - NPO, can advance to renal diet with fluid restriction when clinically  stable  Tobie Poet, NP [employer] 08/23/2021, 4:16 PM

## 2021-08-23 NOTE — Progress Notes (Signed)
?PROGRESS NOTE ? ?Patrick [name]  ?DOB: [birth year]  ?PCP: [name], MD ?TFT:[id number]  ?DOA: 08/22/2021 ? LOS: 1 day  ?Hospital Day: 2 ? ?Brief narrative: ?Patrick [name] is a 66 y.o. male with PMH significant for ESRD HD TTS, CHF, sickle cell disease, permanent A-fib, upper GI bleed due to AVMs, laparotomy/cholecystectomy/ischemic small bowel resection in 12/22 and recent hospitalization from SBO that resolved with NGT and pelvic abscess that was treated with drain placement and antibiotics. ? ?During hospitalization in December 2022, he had laparoscopy with subsequent laparotomy.  ?He was subsequently admitted twice after that.  Last hospitalized in 2/13 to 2/24 for bowel obstruction which improved with conservative measures.   ? ?Patient presented to the ED on 3/7 with complaint of severe abdominal pain for a day. ?CT scan showed diffuse small bowel dilatation suggestive of high-grade small bowel obstruction.  Surgical anastomosis in the right lower quadrant of the abdomen with continued stool like material and a transition point distal to the anastomosis.  It also showed a fluid collection in the anterior lower abdominal pelvis, measuring 4.5 cm. ? ?He was admitted to hospitalist service. ?General surgery consultation was obtained. ? ?Subjective: ?Patient was seen and examined this morning.  Pleasant middle-aged African-American male. ?Lying on bed.  Mild distress because of distended abdomen ?General surgery follow-up from this morning appreciated.  Noted a recommendation to insert NG tube. ? ?Principal Problem: ?  SBO (small bowel obstruction) ([place]) ?Active Problems: ?  Chronic combined systolic and diastolic CHF (congestive heart failure) ([place]) ?  ESRD on HD TTS ?  Prolonged QT interval ?  Sickle cell anemia ([facility]) ?  Permanent atrial fibrillation ([place]) ?  Hypothermia ?  Abdominal fluid collection ?  Anemia ?  Protein-calorie malnutrition, severe ?  Visual impairment ?  COPD not affecting  current episode of care [facility]) ?  ? ?Assessment and Plan: ?Recurrent SBO (small bowel obstruction) ?Recent laparotomy ?-Presented with abdominal pain, nausea, vomiting. ?-CT abdomen finding as above showing SBO with transition point in RLQ. ?-Started on conservative management with bowel rest, NG decompression, IV fluid, pain management. ?-IV Protonix, IV Compazine ?-General surgery following ? ?Intra-abdominal collection ?-4.5 cm collection in the anterior lower abdomen/pelvis.  IR was consulted.  Per IR note, fluid collection is small in size for drainage.  Continue to monitor with antibiotics. ?-Currently on IV Zosyn. ? ?ESRD HD TTS ?-Euvolemic at this time.  Because of persistent NG drain output, patient needs maintenance IV fluid.  Nephrology aware. ? ?Chronic, systolic and diastolic CHF ?-Currently remains compensated.  Chronic mild bilateral lower extremity edema.   ? ?Prolonged QT interval ?-QTc 551 ms on EKG on admission.  Minimize or avoid QT prolonging drugs ?-Repeat EKG ? ?Permanent atrial fibrillation ?-On low-dose Coreg at home.  Currently NPO.  Continue IV metoprolol 2.5 mg as needed. ?-not on anticoagulation due to GI bleed/AVM ? ?Sickle cell anemia ?History of GI bleed from AVM treated with APC on 07/2021 ?-Not in crisis. ?-Hemoglobin dropped from 9.4 yesterday to 7.6 today.  Continue to monitor. ?Recent Labs  ?  05/31/21 ?0955 05/31/21 ?1518 08/09/21 ?7062 08/10/21 ?3762 08/11/21 ?0830 08/22/21 ?0945 08/23/21 ?8315  ?HGB  --    < > 6.9* 7.7* 9.0* 9.4* 7.6*  ?MCV  --    < > 98.1 95.6 98.1 102.3* 100.0  ?FERRITIN 250  --   --   --   --   --   --   ?TIBC 232*  --   --   --   --   --   --   ?  IRON 80  --   --   --   --   --   --   ? < > = values in this interval not displayed.  ? ?COPD ?-Respiratory status stable.   ? ?Visual impairment ?-Reorientation and delirium precaution ? ?Protein-calorie malnutrition, severe ?-Very frail with significant muscle mass and subcu fat loss.  ?-Consult dietitian once he  started taking p.o. ? ?Goals of care ?  Code Status: DNR  ? ? ?Mobility: Encourage ambulation ? ?Nutritional status:  ?Body mass index is 17.86 kg/m?.  ?  ?  ? ? ? ? ?Diet:  ?Diet Order   ? ?       ?  Diet NPO time specified  Diet effective now       ?  ? ?  ?  ? ?  ? ? ?DVT prophylaxis:  ?heparin injection 5,000 Units Start: 08/22/21 2200 ?  ?Antimicrobials: Zosyn IV ?Fluid: D5 half NS at 50 mill per hour ?Consultants: Nephrology, IR, surgery ?Family Communication: None at bedside ? ?Status is: Inpatient ? ?Continue in-hospital care because: Continued management of bowel obstruction ?Level of care: Med-Surg  ? ?Dispo: The patient is from: Home ?             Anticipated d/c is to: Hopefully home ultimately ?             Patient currently is not medically stable to d/c. ?  Difficult to place patient No ? ? ? ? ?Infusions:  ? dextrose 5 % and 0.45% NaCl 50 mL/hr at 08/22/21 1815  ? piperacillin-tazobactam (ZOSYN)  IV 2.25 g (08/23/21 1447)  ? ? ?Scheduled Meds: ? heparin  5,000 Units Subcutaneous Q8H  ? pantoprazole (PROTONIX) IV  40 mg Intravenous Daily  ? ? ?PRN meds: ?HYDROmorphone (DILAUDID) injection, metoprolol tartrate, prochlorperazine  ? ?Antimicrobials: ?Anti-infectives (From admission, onward)  ? ? Start     Dose/Rate Route Frequency Ordered Stop  ? 08/22/21 1915  piperacillin-tazobactam (ZOSYN) IVPB 2.25 g       ? 2.25 g ?100 mL/hr over 30 Minutes Intravenous Every 8 hours 08/22/21 1902    ? ?  ? ? ?Objective: ?Vitals:  ? 08/23/21 0404 08/23/21 0720  ?BP: 137/76 135/68  ?Pulse: 85 87  ?Resp: 17 16  ?Temp: 98.2 ?F (36.8 ?C) (!) 97.5 ?F (36.4 ?C)  ?SpO2: 100% 100%  ? ? ?Intake/Output Summary (Last 24 hours) at 08/23/2021 1528 ?Last data filed at 08/23/2021 0400 ?Gross per 24 hour  ?Intake --  ?Output 500 ml  ?Net -500 ml  ? ?Filed Weights  ? 08/22/21 2133  ?Weight: 58.1 kg  ? ?Weight change:  ?Body mass index is 17.86 kg/m?.  ? ?Physical Exam: ?General exam: Pleasant, middle-aged African-American male.  Pain  controlled ?Skin: No rashes, lesions or ulcers. ?HEENT: Atraumatic, normocephalic, no obvious bleeding ?Lungs: Clear to auscultation bilaterally ?CVS: Regular rate and rhythm, no murmur ?GI/Abd soft, mild diffuse tenderness, distention present, bowel sound present ?CNS: Alert, awake, oriented x3 ?Psychiatry: Sad affect ?Extremities: Mild bilateral pedal edema ? ?Data Review: I have personally reviewed the laboratory data and studies available. ? ?F/u labs ordered ?Unresulted Labs (From admission, onward)  ? ?  Start     Ordered  ? 08/24/21 0500  CBC with Differential/Platelet  Daily,   R     ?Question:  Specimen collection method  Answer:  Lab=Lab collect  ? 08/23/21 1528  ? 08/24/21 1610  Basic metabolic panel  Daily,   R     ?  Question:  Specimen collection method  Answer:  Lab=Lab collect  ? 08/23/21 1528  ? ?  ?  ? ?  ? ? ?Signed, ?Terrilee Croak, MD ?[facility] ?08/23/2021 ? ? ? ? ? ? ? ? ? ? ?  ?

## 2021-08-23 NOTE — Progress Notes (Signed)
Patient familiar to IR from prior intra-abdominal fluid collection drain placed 08/06/21 and removed 08/11/21. Patient now in the ED with abdominal pain and CT imaging shows a small residual fluid collection.  ? ?IR asked to evaluate this patient for possible aspiration/drain placement. Imaging reviewed by Dr. Pascal Lux and the fluid collection is too small to aspirate/place drain. IR recommends repeat imaging at a later date if the patient's symptoms do not improve.  ? ?Hospitalist and Surgery teams made aware. No IR procedure planned and the order will be deleted.  ? ?Please call IR with any questions. ? Soyla Dryer, AGACNP-BC ?[phone] ?08/23/2021, 8:57 AM ? ? ?

## 2021-08-24 ENCOUNTER — Inpatient Hospital Stay (HOSPITAL_COMMUNITY): Payer: HMO

## 2021-08-24 ENCOUNTER — Ambulatory Visit: Payer: HMO | Admitting: Internal Medicine

## 2021-08-24 DIAGNOSIS — K56609 Unspecified intestinal obstruction, unspecified as to partial versus complete obstruction: Secondary | ICD-10-CM | POA: Diagnosis not present

## 2021-08-24 LAB — CBC WITH DIFFERENTIAL/PLATELET
Abs Immature Granulocytes: 0.08 10*3/uL — ABNORMAL HIGH (ref 0.00–0.07)
Basophils Absolute: 0.1 10*3/uL (ref 0.0–0.1)
Basophils Relative: 2 %
Eosinophils Absolute: 0 10*3/uL (ref 0.0–0.5)
Eosinophils Relative: 1 %
HCT: 17.2 % — ABNORMAL LOW (ref 39.0–52.0)
Hemoglobin: 6.3 g/dL — CL (ref 13.0–17.0)
Immature Granulocytes: 2 %
Lymphocytes Relative: 10 %
Lymphs Abs: 0.4 10*3/uL — ABNORMAL LOW (ref 0.7–4.0)
MCH: 35.8 pg — ABNORMAL HIGH (ref 26.0–34.0)
MCHC: 36.6 g/dL — ABNORMAL HIGH (ref 30.0–36.0)
MCV: 97.7 fL (ref 80.0–100.0)
Monocytes Absolute: 1 10*3/uL (ref 0.1–1.0)
Monocytes Relative: 26 %
Neutro Abs: 2.3 10*3/uL (ref 1.7–7.7)
Neutrophils Relative %: 59 %
Platelets: 131 10*3/uL — ABNORMAL LOW (ref 150–400)
RBC: 1.76 MIL/uL — ABNORMAL LOW (ref 4.22–5.81)
RDW: 22.8 % — ABNORMAL HIGH (ref 11.5–15.5)
WBC: 3.9 10*3/uL — ABNORMAL LOW (ref 4.0–10.5)
nRBC: 10.8 % — ABNORMAL HIGH (ref 0.0–0.2)

## 2021-08-24 LAB — BASIC METABOLIC PANEL
Anion gap: 10 (ref 5–15)
BUN: 24 mg/dL — ABNORMAL HIGH (ref 8–23)
CO2: 27 mmol/L (ref 22–32)
Calcium: 8.2 mg/dL — ABNORMAL LOW (ref 8.9–10.3)
Chloride: 96 mmol/L — ABNORMAL LOW (ref 98–111)
Creatinine, Ser: 4.35 mg/dL — ABNORMAL HIGH (ref 0.61–1.24)
GFR, Estimated: 14 mL/min — ABNORMAL LOW (ref >60)
Glucose, Bld: 87 mg/dL (ref 70–99)
Potassium: 3.9 mmol/L (ref 3.5–5.1)
Sodium: 133 mmol/L — ABNORMAL LOW (ref 135–145)

## 2021-08-24 LAB — PREPARE RBC (CROSSMATCH)

## 2021-08-24 MED ORDER — HYDROMORPHONE HCL 1 MG/ML IJ SOLN
0.5000 mg | INTRAMUSCULAR | Status: DC | PRN
  Administered 2021-08-24 – 2021-08-25 (×9): 1 mg via INTRAVENOUS
  Administered 2021-08-26: 0.5 mg via INTRAVENOUS
  Administered 2021-08-26 – 2021-08-29 (×13): 1 mg via INTRAVENOUS
  Filled 2021-08-24 (×22): qty 1

## 2021-08-24 MED ORDER — SODIUM CHLORIDE 0.9% IV SOLUTION: Freq: Once | INTRAVENOUS | Status: DC

## 2021-08-24 MED ORDER — CINACALCET HCL 30 MG PO TABS
30.0000 mg | ORAL_TABLET | ORAL | Status: DC
  Administered 2021-08-26 – 2021-08-29 (×2): 30 mg via ORAL
  Filled 2021-08-24 (×4): qty 1

## 2021-08-24 NOTE — Progress Notes (Signed)
Called the dialysis number at [phone] to confirm was taking place tonight. They receptionist confirmed that patient would receive dialysis tonight  ?

## 2021-08-24 NOTE — Progress Notes (Signed)
Mobility Specialist Progress Note  ? ? 08/24/21 1429  ?Mobility  ?Activity Contraindicated/medical hold  ? ?RN advised to hold off. Will f/u as schedule permits.  ? ?Patrick [name] ?Mobility Specialist  ?M.S. 5N: [phone]  ?

## 2021-08-24 NOTE — Plan of Care (Signed)
  Problem: Education: Goal: Knowledge of General Education information will improve Description Including pain rating scale, medication(s)/side effects and non-pharmacologic comfort measures Outcome: Progressing   Problem: Health Behavior/Discharge Planning: Goal: Ability to manage health-related needs will improve Outcome: Progressing   

## 2021-08-24 NOTE — Progress Notes (Addendum)
Progress Note     Subjective: Patient denies bowel function. Reports abdominal pain is maybe a little better. NGT is uncomfortable. He really would like to be able to take a few bites of italian ice popsicle. He sat up yesterday some and did walk to the bathroom to wash up this AM.   Objective: Vital signs in last 24 hours: Temp:  [98 F (36.7 C)-98.1 F (36.7 C)] 98 F (36.7 C) (03/09 0550) Pulse Rate:  [83-90] 90 (03/09 0550) Resp:  [16-18] 16 (03/09 0550) BP: (132-135)/(71-72) 135/72 (03/09 0550) SpO2:  [96 %-100 %] 100 % (03/09 0550)    Intake/Output from previous day: 03/08 0701 - 03/09 0700 In: 191.2 [I.V.:141.2; IV Piggyback:50] Out: 250 [Emesis/NG output:250] Intake/Output this shift: No intake/output data recorded.  PE: General: pleasant, WD, thin male who is laying in bed in NAD Heart: regular, rate, and rhythm.   Lungs: Respiratory effort nonlabored Abd: soft, NT, distended, BS hypoactive, NGT with small amount thin bilious drainage  MS: all 4 extremities are symmetrical with no cyanosis, clubbing, or edema. Skin: warm and dry with no masses, lesions, or rashes Psych: A&Ox3 with an appropriate affect.    Lab Results:  Recent Labs    08/22/21 0945 08/23/21 0621  WBC 3.6* 3.5*  HGB 9.4* 7.6*  HCT 26.9* 21.7*  PLT 189 162    BMET Recent Labs    08/23/21 0621 08/24/21 0328  NA 131* 133*  K 4.4 3.9  CL 92* 96*  CO2 29 27  GLUCOSE 85 87  BUN 25* 24*  CREATININE 3.94* 4.35*  CALCIUM 8.4* 8.2*    PT/INR No results for input(s): LABPROT, INR in the last 72 hours. CMP     Component Value Date/Time   NA 133 (L) 08/24/2021 0328   NA 135 05/24/2020 1119   K 3.9 08/24/2021 0328   CL 96 (L) 08/24/2021 0328   CO2 27 08/24/2021 0328   GLUCOSE 87 08/24/2021 0328   BUN 24 (H) 08/24/2021 0328   BUN 25 05/24/2020 1119   CREATININE 4.35 (H) 08/24/2021 0328   CREATININE 2.90 (H) 08/11/2015 1359   CALCIUM 8.2 (L) 08/24/2021 0328   PROT 7.7  08/22/2021 0945   PROT 7.5 05/24/2020 1119   ALBUMIN 1.9 (L) 08/23/2021 0621   ALBUMIN 4.1 05/24/2020 1119   AST 38 08/22/2021 0945   ALT 22 08/22/2021 0945   ALKPHOS 173 (H) 08/22/2021 0945   BILITOT 1.6 (H) 08/22/2021 0945   BILITOT 2.4 (H) 05/24/2020 1119   GFRNONAA 14 (L) 08/24/2021 0328   GFRNONAA 23 (L) 08/11/2015 1359   GFRAA 24 (L) 05/24/2020 1119   GFRAA 27 (L) 08/11/2015 1359   Lipase     Component Value Date/Time   LIPASE 29 08/22/2021 0945       Studies/Results: DG Abd Portable 1V  Result Date: 08/23/2021 CLINICAL DATA:  Abdominal pain, small-bowel obstruction. EXAM: PORTABLE ABDOMEN - 1 VIEW COMPARISON:  Abdominal x-ray 08/23/2021. CTA abdomen and pelvis 08/22/2021. FINDINGS: Again seen are dilated small bowel loops throughout the abdomen measuring up to 4.3 cm. Three of distention is similar to the prior study. Contrast is also seen within dilated small bowel in the right abdomen. No colonic contrast identified. No colonic gas identified. There is contrast in the bladder. Surgical clips are seen in the upper abdomen. Nasogastric tube tip in the mid stomach. IMPRESSION: 1. Stable findings compatible with high-grade distal small-bowel obstruction. 2. Nasogastric tube tip in the mid stomach. Electronically Signed  By: Ronney Asters M.D.   On: 08/23/2021 23:37   DG Abd Portable 1V  Result Date: 08/23/2021 CLINICAL DATA:  NG placement EXAM: PORTABLE ABDOMEN - 1 VIEW COMPARISON:  08/23/2021 FINDINGS: NG has been placed with the tip in the lateral body of the stomach. Mild small bowel dilatation unchanged. IMPRESSION: NG tube in the lateral body of the stomach. Mild small bowel dilatation unchanged. Electronically Signed   By: Franchot Gallo M.D.   On: 08/23/2021 11:39   DG Abd Portable 1V-Small Bowel Obstruction Protocol-initial, 8 hr delay  Result Date: 08/23/2021 CLINICAL DATA:  Small bowel obstruction EXAM: PORTABLE ABDOMEN - 1 VIEW COMPARISON:  CT the abdomen from  yesterday FINDINGS: Dilated small bowel loops from known obstruction. No progression of contrast beyond the stomach and small bowel where there is dilution. Paucity of colonic gas. Contrast in the urinary bladder from recent enhanced scan. IMPRESSION: Ongoing small bowel obstruction. No enteric contrast is seen to reach the colon. Electronically Signed   By: Jorje Guild M.D.   On: 08/23/2021 07:46   DG Abd Portable 1V-Small Bowel Protocol-Position Verification  Result Date: 08/22/2021 CLINICAL DATA:  NG tube placement. EXAM: PORTABLE ABDOMEN - 1 VIEW COMPARISON:  CTA abdomen and pelvis 08/22/2021 FINDINGS: Nasogastric tube tip at the level of the proximal body of the stomach. Dilated small bowel loops are unchanged in the upper abdomen. Small radiopaque densities again noted in the stomach and surgical clips in the right upper quadrant. IMPRESSION: 1. Enteric tube tip in the proximal body of the stomach. 2. Stable dilated small bowel compatible with small-bowel obstruction. Electronically Signed   By: Ronney Asters M.D.   On: 08/22/2021 21:00   CT Angio Abd/Pel W and/or Wo Contrast  Result Date: 08/22/2021 CLINICAL DATA:  Mesenteric ischemia, acute.  GI bleed. EXAM: CTA ABDOMEN AND PELVIS WITHOUT AND WITH CONTRAST TECHNIQUE: Multidetector CT imaging of the abdomen and pelvis was performed using the standard protocol during bolus administration of intravenous contrast. Multiplanar reconstructed images and MIPs were obtained and reviewed to evaluate the vascular anatomy. RADIATION DOSE REDUCTION: This exam was performed according to the departmental dose-optimization program which includes automated exposure control, adjustment of the mA and/or kV according to patient size and/or use of iterative reconstruction technique. CONTRAST:  184mL OMNIPAQUE IOHEXOL 350 MG/ML SOLN COMPARISON:  08/11/2021 and 08/04/2021 FINDINGS: VASCULAR Aorta: Normal caliber of the abdominal aorta with mild atherosclerotic disease.  No significant stenosis. No evidence for dissection. Celiac: Mild narrowing of the proximal celiac trunk related to the configuration. Left gastric artery originates directly from the aorta just proximal to the celiac trunk. SMA: Patent without evidence of aneurysm, dissection, vasculitis or significant stenosis. Renals: Both renal arteries are patent without evidence of aneurysm, dissection, vasculitis, fibromuscular dysplasia or significant stenosis. IMA: Patent Inflow: Patent without evidence of aneurysm, dissection, vasculitis or significant stenosis. Proximal Outflow: Proximal femoral arteries are patent bilaterally. Veins: Hepatic veins and portal veins are patent. Main mesenteric veins are patent. Review of the MIP images confirms the above findings. NON-VASCULAR Lower chest: Small right pleural effusion and trace left pleural fluid. Cardiomegaly. Small amount of atelectasis at both lung bases. Hepatobiliary: Cholecystectomy. Normal appearance of the liver. Distal common bile duct measures approximately 8 mm and stable. Pancreas: Unremarkable. No pancreatic ductal dilatation or surrounding inflammatory changes. 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 Adrenals/Urinary Tract: Limited evaluation of the adrenal glands without gross abnormality. Evidence for bilateral renal  cysts. Cortical thinning in both kidneys. No hydronephrosis. Small amount of fluid in the urinary bladder. Stomach/Bowel: Again noted is an endoscopic gastric clip. Diffuse small bowel dilatation with fluid with air-fluid levels. There is stool throughout the colon. Surgical bowel anastomosis in the right abdomen is distended with stool-like material. Small bowel distal to the anastomosis is decompressed and this is likely the transition point for the small bowel obstruction. High-density material in the appendix. Lymphatic: No significant lymph node enlargement in the  abdomen or pelvis. Limited evaluation for lymphadenopathy due to the lack of intra-abdominal fat. Reproductive: Prostate is unremarkable. Other: Difficult to exclude a small amount of ascites in the abdomen and pelvis due to the diffuse subcutaneous edema. No evidence for free air. Previously, the patient had a fluid collection in the anterior lower abdomen. The drain has been removed from this area but there is a small residual fluid collection that measures 4.5 x 1.4 x 1.1 cm on sequence 7 image 63. There is no gas within the small residual collection. Musculoskeletal: Diffuse sclerosis throughout the bones and compatible with history of sickle cell disease and end-stage renal disease. IMPRESSION: VASCULAR 1. Atherosclerotic disease in the abdomen and pelvis without significant stenosis. 2. Main visceral arteries are patent. No evidence for mesenteric ischemia. 3. No evidence for active GI bleeding. Endoscopic gastric clip is present. NON-VASCULAR 1. Diffuse small bowel dilatation and findings are suggestive for a high-grade small bowel obstruction. Surgical anastomosis in the right lower quadrant of the abdomen which contains stool-like material and there appears to be a transition point distal to this anastomosis. 2. Difficult to exclude a small amount of ascites in the abdomen and pelvis. 3. Residual fluid collection in the anterior lower abdomen/pelvis. Previously, there was a percutaneous drain in this area. Fluid collection measures up to 4.5 cm. No evidence for gas within the collection. 4. Small bilateral pleural effusions, right side greater than left. These results were called by telephone at the time of interpretation on 08/22/2021 at 4:55 pm to provider MATTHEW TRIFAN , who verbally acknowledged these results. Electronically Signed   By: Markus Daft M.D.   On: 08/22/2021 16:57    Anti-infectives: Anti-infectives (From admission, onward)    Start     Dose/Rate Route Frequency Ordered Stop   08/22/21  1915  piperacillin-tazobactam (ZOSYN) IVPB 2.25 g        2.25 g 100 mL/hr over 30 Minutes Intravenous Every 8 hours 08/22/21 1902          Assessment/Plan 3 months s/p ex lap with cholecystectomy, SBR SBO vs ileus  Recurrent Intra-abdominal fluid collection s/p previous drain  - CT 3/7 with sbo and possible transition point distal to small bowel anastomosis in RLQ and residual fluid collection in anterior lower abdomen/pelvis measuring 4.5 cm - SBO protocol started 3/7 and 8h delay film with ongoing SBO and no contrast in the colon, 24 h film without contrast in colon and ongoing SBO but contrast does appear to have advanced some  - repeat film this AM around 1130  - IR does not feel collection is currently large enough for draining, consider repeat imaging in a few days if not improving  - keep NPO - goal K >4.0 and Mg >2.0 to optimize bowel function  - mobilize as tolerated   FEN - NPO, NGT to LIWS, IVF @50  cc/h VTE - SQH ID - zosyn 3/7>>   Recent UGI bleed ESRD on HD CHF Severe MR, TR A fib HTN Sickle  Cell disease Anemia  LOS: 2 days    Norm Parcel, [facility] Surgery 08/24/2021, 8:54 AM Please see Amion for pager number during day hours 7:00am-4:30pm

## 2021-08-24 NOTE — Progress Notes (Addendum)
Walked into patient's room and his NG tube had been pulled out some. I turned suction off. Then I advanced ng tube back to the tape marking the length of tube when inserted. I notified Dr. Pietro Cassis and he ordered an abdominal xray to confirm placement. ? ?Edit: Radiology called to tell me that NG tube needed to be advanced 10 cm according to CXR. I advanced as recommended.  ?

## 2021-08-24 NOTE — Progress Notes (Signed)
Date and time results received: 08/24/21 0900 ?(use smartphrase ".now" to insert current time) ? ?Test: Hemoglobin ?Critical Value: 6.3 ? ?Name of Provider Notified: Dr/ Dahal ? ?Orders Received? Or Actions Taken?:  ?

## 2021-08-24 NOTE — Progress Notes (Signed)
[facility] Progress Note   Subjective: Seen in room. Denies SOB, on RA. 1+BLE pitting edema. NS DC'd. Still NPO. HD later today.   Objective Vitals:   08/23/21 1549 08/23/21 2018 08/24/21 0550 08/24/21 0906  BP: 133/72 132/71 135/72 134/73  Pulse: 83 89 90 88  Resp: 16 18 16 17   Temp: 98.1 F (36.7 C) 98 F (36.7 C) 98 F (36.7 C) 97.7 F (36.5 C)  TempSrc: Oral Oral Oral Oral  SpO2: 100% 96% 100% 100%  Weight:       Physical Exam General: Cachetic appearing male in NAD Heart: irreg, irreg. No M/R/G Lungs: CTAB A/P Abdomen: still distended, tender. NGT to low wall.  Extremities: 1+ pitting LE edema Dialysis Access: R AVF + T/B    Additional Objective Labs: Basic Metabolic Panel: Recent Labs  Lab 08/22/21 0945 08/22/21 1802 08/23/21 0621 08/24/21 0328  NA 132*  --  131* 133*  K 3.9  --  4.4 3.9  CL 91*  --  92* 96*  CO2 27  --  29 27  GLUCOSE 83  --  85 87  BUN 21  --  25* 24*  CREATININE 3.08* 3.53* 3.94* 4.35*  CALCIUM 8.5*  --  8.4* 8.2*  PHOS  --   --  3.9  --    Liver Function Tests: Recent Labs  Lab 08/22/21 0945 08/23/21 0621  AST 38  --   ALT 22  --   ALKPHOS 173*  --   BILITOT 1.6*  --   PROT 7.7  --   ALBUMIN 2.4* 1.9*   Recent Labs  Lab 08/22/21 0945  LIPASE 29   CBC: Recent Labs  Lab 08/22/21 0945 08/23/21 0621  WBC 3.6* 3.5*  NEUTROABS 2.4  --   HGB 9.4* 7.6*  HCT 26.9* 21.7*  MCV 102.3* 100.0  PLT 189 162   Blood Culture    Component Value Date/Time   SDES ABSCESS 08/06/2021 1110   SPECREQUEST NONE 08/06/2021 1110   CULT  08/06/2021 1110    FEW ESCHERICHIA COLI ABUNDANT BACTEROIDES FRAGILIS BETA LACTAMASE POSITIVE Performed at [facility], [place] [address] [place], [place] [address]    REPTSTATUS 08/09/2021 FINAL 08/06/2021 1110    Cardiac Enzymes: No results for input(s): CKTOTAL, CKMB, CKMBINDEX, TROPONINI in the last 168 hours. CBG: No results for input(s): GLUCAP in the last 168  hours. Iron Studies: No results for input(s): IRON, TIBC, TRANSFERRIN, FERRITIN in the last 72 hours. @lablastinr3 @ Studies/Results: DG Abd Portable 1V  Result Date: 08/23/2021 CLINICAL DATA:  Abdominal pain, small-bowel obstruction. EXAM: PORTABLE ABDOMEN - 1 VIEW COMPARISON:  Abdominal x-ray 08/23/2021. CTA abdomen and pelvis 08/22/2021. FINDINGS: Again seen are dilated small bowel loops throughout the abdomen measuring up to 4.3 cm. Three of distention is similar to the prior study. Contrast is also seen within dilated small bowel in the right abdomen. No colonic contrast identified. No colonic gas identified. There is contrast in the bladder. Surgical clips are seen in the upper abdomen. Nasogastric tube tip in the mid stomach. IMPRESSION: 1. Stable findings compatible with high-grade distal small-bowel obstruction. 2. Nasogastric tube tip in the mid stomach. Electronically Signed   By: Ronney Asters M.D.   On: 08/23/2021 23:37   DG Abd Portable 1V  Result Date: 08/23/2021 CLINICAL DATA:  NG placement EXAM: PORTABLE ABDOMEN - 1 VIEW COMPARISON:  08/23/2021 FINDINGS: NG has been placed with the tip in the lateral body of the stomach. Mild small bowel  dilatation unchanged. IMPRESSION: NG tube in the lateral body of the stomach. Mild small bowel dilatation unchanged. Electronically Signed   By: Franchot Gallo M.D.   On: 08/23/2021 11:39   DG Abd Portable 1V-Small Bowel Obstruction Protocol-initial, 8 hr delay  Result Date: 08/23/2021 CLINICAL DATA:  Small bowel obstruction EXAM: PORTABLE ABDOMEN - 1 VIEW COMPARISON:  CT the abdomen from yesterday FINDINGS: Dilated small bowel loops from known obstruction. No progression of contrast beyond the stomach and small bowel where there is dilution. Paucity of colonic gas. Contrast in the urinary bladder from recent enhanced scan. IMPRESSION: Ongoing small bowel obstruction. No enteric contrast is seen to reach the colon. Electronically Signed   By: Jorje Guild M.D.   On: 08/23/2021 07:46   DG Abd Portable 1V-Small Bowel Protocol-Position Verification  Result Date: 08/22/2021 CLINICAL DATA:  NG tube placement. EXAM: PORTABLE ABDOMEN - 1 VIEW COMPARISON:  CTA abdomen and pelvis 08/22/2021 FINDINGS: Nasogastric tube tip at the level of the proximal body of the stomach. Dilated small bowel loops are unchanged in the upper abdomen. Small radiopaque densities again noted in the stomach and surgical clips in the right upper quadrant. IMPRESSION: 1. Enteric tube tip in the proximal body of the stomach. 2. Stable dilated small bowel compatible with small-bowel obstruction. Electronically Signed   By: Ronney Asters M.D.   On: 08/22/2021 21:00   CT Angio Abd/Pel W and/or Wo Contrast  Result Date: 08/22/2021 CLINICAL DATA:  Mesenteric ischemia, acute.  GI bleed. EXAM: CTA ABDOMEN AND PELVIS WITHOUT AND WITH CONTRAST TECHNIQUE: Multidetector CT imaging of the abdomen and pelvis was performed using the standard protocol during bolus administration of intravenous contrast. Multiplanar reconstructed images and MIPs were obtained and reviewed to evaluate the vascular anatomy. RADIATION DOSE REDUCTION: This exam was performed according to the departmental dose-optimization program which includes automated exposure control, adjustment of the mA and/or kV according to patient size and/or use of iterative reconstruction technique. CONTRAST:  147mL OMNIPAQUE IOHEXOL 350 MG/ML SOLN COMPARISON:  08/11/2021 and 08/04/2021 FINDINGS: VASCULAR Aorta: Normal caliber of the abdominal aorta with mild atherosclerotic disease. No significant stenosis. No evidence for dissection. Celiac: Mild narrowing of the proximal celiac trunk related to the configuration. Left gastric artery originates directly from the aorta just proximal to the celiac trunk. SMA: Patent without evidence of aneurysm, dissection, vasculitis or significant stenosis. Renals: Both renal arteries are patent without evidence  of aneurysm, dissection, vasculitis, fibromuscular dysplasia or significant stenosis. IMA: Patent Inflow: Patent without evidence of aneurysm, dissection, vasculitis or significant stenosis. Proximal Outflow: Proximal femoral arteries are patent bilaterally. Veins: Hepatic veins and portal veins are patent. Main mesenteric veins are patent. Review of the MIP images confirms the above findings. NON-VASCULAR Lower chest: Small right pleural effusion and trace left pleural fluid. Cardiomegaly. Small amount of atelectasis at both lung bases. Hepatobiliary: Cholecystectomy. Normal appearance of the liver. Distal common bile duct measures approximately 8 mm and stable. Pancreas: Unremarkable. No pancreatic ductal dilatation or surrounding inflammatory changes. 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 Adrenals/Urinary Tract: Limited evaluation of the adrenal glands without gross abnormality. Evidence for bilateral renal cysts. Cortical thinning in both kidneys. No hydronephrosis. Small amount of fluid in the urinary bladder. Stomach/Bowel: Again noted is an endoscopic gastric clip. Diffuse small bowel dilatation with fluid with air-fluid levels. There is stool throughout the colon. Surgical bowel anastomosis in the right abdomen is distended with stool-like material. Small  bowel distal to the anastomosis is decompressed and this is likely the transition point for the small bowel obstruction. High-density material in the appendix. Lymphatic: No significant lymph node enlargement in the abdomen or pelvis. Limited evaluation for lymphadenopathy due to the lack of intra-abdominal fat. Reproductive: Prostate is unremarkable. Other: Difficult to exclude a small amount of ascites in the abdomen and pelvis due to the diffuse subcutaneous edema. No evidence for free air. Previously, the patient had a fluid collection in the anterior lower abdomen. The drain has  been removed from this area but there is a small residual fluid collection that measures 4.5 x 1.4 x 1.1 cm on sequence 7 image 63. There is no gas within the small residual collection. Musculoskeletal: Diffuse sclerosis throughout the bones and compatible with history of sickle cell disease and end-stage renal disease. IMPRESSION: VASCULAR 1. Atherosclerotic disease in the abdomen and pelvis without significant stenosis. 2. Main visceral arteries are patent. No evidence for mesenteric ischemia. 3. No evidence for active GI bleeding. Endoscopic gastric clip is present. NON-VASCULAR 1. Diffuse small bowel dilatation and findings are suggestive for a high-grade small bowel obstruction. Surgical anastomosis in the right lower quadrant of the abdomen which contains stool-like material and there appears to be a transition point distal to this anastomosis. 2. Difficult to exclude a small amount of ascites in the abdomen and pelvis. 3. Residual fluid collection in the anterior lower abdomen/pelvis. Previously, there was a percutaneous drain in this area. Fluid collection measures up to 4.5 cm. No evidence for gas within the collection. 4. Small bilateral pleural effusions, right side greater than left. These results were called by telephone at the time of interpretation on 08/22/2021 at 4:55 pm to provider MATTHEW TRIFAN , who verbally acknowledged these results. Electronically Signed   By: Markus Daft M.D.   On: 08/22/2021 16:57   Medications:  sodium chloride     sodium chloride     dextrose 5 % and 0.45% NaCl 50 mL/hr at 08/23/21 1803   piperacillin-tazobactam (ZOSYN)  IV 2.25 g (08/24/21 0533)    sodium chloride   Intravenous Once   Chlorhexidine Gluconate Cloth  6 each Topical Q0600   heparin  5,000 Units Subcutaneous Q8H   pantoprazole (PROTONIX) IV  40 mg Intravenous Daily     Dialysis Orders:  TTS - [facility] ([facility])  4hrs, BFR 400, DFR 500,  EDW 52.5kg, 3K/ 2.5Ca No heparin  bolus Mircera 225 mcg q2wks - last 08/19/21 Sensipar 30mg  3x weekly   Assessment/Plan: Recurrent SBO-continue conservative management, on IV Protonix and IV Compazine. Surgery following Sickle Cell disease: HGB 7.6. Transfuse if less than 6.5. Recent ESA as OP.  Combined systolic and diastolic HF: EF 26-37% 85/8850. LV global hypokinesis. Has been on Torsemide to help control volume between dialysis treatments. Optimize volume with HD.  Persistent AF/AFL-Not on anticoagulation D/T GIB.  2.   ESRD - on HD TTS. Plan for HD 08/24/2021 per his usual schedule 3.   Hypertension/volume  - BP stable. Has been on IVF, now with BLE pitting edema. Left HD 08/22/2021 6.8 kg above OP EDW. No CXR on admission. Will order. UF and lower volume as tolerated.  4.   Anemia of CKD -H/O SSD. See # 2 5.   Secondary Hyperparathyroidism -  Continue binders when able to eat. Continue Sensipar. VDRA on hold D/T high corrected calcium 6.    Nutrition - NPO, can advance to renal diet with fluid restriction when clinically  stable. Albumin 1.9. Severe PCM. Consult dietician.     Marquesha Robideau H. Lavonna Lampron NP-C 08/24/2021, 10:27 AM  [employer] [phone]

## 2021-08-24 NOTE — Progress Notes (Signed)
?PROGRESS NOTE ? ?Bomani D Lange  ?DOB: 1955/08/28  ?PCP: [name], MD ?GEX:[id number]  ?DOA: 08/22/2021 ? LOS: 2 days  ?Hospital Day: 3 ? ?Brief narrative: ?Sabastion D Costales is a 66 y.o. male with PMH significant for ESRD HD TTS, CHF, sickle cell disease, permanent A-fib, upper GI bleed due to AVMs, laparotomy/cholecystectomy/ischemic small bowel resection in 12/22 and recent hospitalization from SBO that resolved with NGT and pelvic abscess that was treated with drain placement and antibiotics. ? ?During hospitalization in December 2022, he had laparoscopy with subsequent laparotomy.  ?He was subsequently admitted twice after that.  Last hospitalized in 2/13 to 2/24 for bowel obstruction which improved with conservative measures.   ? ?Patient presented to the ED on 3/7 with complaint of severe abdominal pain for a day. ?CT scan showed diffuse small bowel dilatation suggestive of high-grade small bowel obstruction.  Surgical anastomosis in the right lower quadrant of the abdomen with continued stool like material and a transition point distal to the anastomosis.  It also showed a fluid collection in the anterior lower abdominal pelvis, measuring 4.5 cm. ? ?He was admitted to hospitalist service. ?General surgery consultation was obtained. ? ?Subjective: ?Patient was seen and examined this morning.  ?Sitting up at the edge of the bed.  Not in distress.  NG tube with greenish drainage in the wall suction.  ?Taking some ice chips as allowed by general surgery this morning.  ?Hemoglobin low at 6.3 this morning. ? ?Principal Problem: ?  SBO (small bowel obstruction) ([place]) ?Active Problems: ?  Chronic combined systolic and diastolic CHF (congestive heart failure) ([place]) ?  ESRD on HD TTS ?  Prolonged QT interval ?  Sickle cell anemia (H[facility]) ?  Permanent atrial fibrillation ([place]) ?  Hypothermia ?  Abdominal fluid collection ?  Anemia ?  Protein-calorie malnutrition, severe ?  Visual impairment ?  COPD not  affecting current episode of care [facility]) ?  ? ?Assessment and Plan: ?3 months s/p laparotomy with cholecystectomy ?SBO versus ileus ?-Presented with abdominal pain, nausea, vomiting. ?-CT abdomen finding as above showing SBO with transition point in RLQ. ?-Started on conservative management with bowel rest, NG decompression, IV fluid, pain management. ?-IV Protonix, IV Compazine ?-General surgery following.  Noted a plan to repeat abdominal x-ray today. ? ?Recurrent intra-abdominal collection s/p previous drain ?-4.5 cm collection in the anterior lower abdomen/pelvis.  IR was consulted.  Per IR note, fluid collection is small in size for drainage.  Continue to monitor with antibiotics. ?-Currently on IV Zosyn. ? ?Acute anemia ?Sickle cell disease ?History of GI bleed from AVM treated with APC on 07/2021 ?-Not in sickle cell crisis clinically but hemoglobin down to 6.3 this morning. ?-2 units of PRBC transfusion ordered. ?Recent Labs  ?  05/31/21 ?0955 05/31/21 ?1518 08/10/21 ?0102 08/11/21 ?0830 08/22/21 ?7253 08/23/21 ?6644 08/24/21 ?0347  ?HGB  --    < > 7.7* 9.0* 9.4* 7.6* 6.3*  ?MCV  --    < > 95.6 98.1 102.3* 100.0 97.7  ?FERRITIN 250  --   --   --   --   --   --   ?TIBC 232*  --   --   --   --   --   --   ?IRON 80  --   --   --   --   --   --   ? < > = values in this interval not displayed.  ? ? ?ESRD HD TTS ?-Euvolemic at this  time.  Because of persistent NG drain output, patient needs maintenance IV fluid.  Nephrology aware. ? ?Hyponatremia ?-Sodium level between 130 and 135.  Probably due to dialysis status, NG tube-, poor oral intake.  Continue to monitor ?Recent Labs  ?Lab 08/22/21 ?0945 08/23/21 ?2119 08/24/21 ?4174  ?NA 132* 131* 133*  ? ?Chronic, systolic and diastolic CHF ?-Currently remains compensated.  Chronic mild bilateral lower extremity edema.   ? ?Prolonged QT interval ?-QTc 551 ms on EKG on admission.  Minimize or avoid QT prolonging drugs ?-Repeat EKG ? ?Permanent atrial fibrillation ?-On  low-dose Coreg at home.  Currently NPO.  Continue IV metoprolol 2.5 mg as needed. ?-not on anticoagulation due to GI bleed/AVM ? ?COPD ?-Respiratory status stable.   ? ?Visual impairment ?-Reorientation and delirium precaution ? ?Protein-calorie malnutrition, severe ?-Very frail with significant muscle mass and subcu fat loss.  ?-Consult dietitian once he started taking p.o. ? ?Goals of care ?  Code Status: DNR  ? ? ?Mobility: Encourage ambulation ? ?Nutritional status:  ?Body mass index is 17.86 kg/m?Marland Kitchen  ?Nutrition Problem: Severe Malnutrition ?Etiology: chronic illness (ESRD on HD) ?Signs/Symptoms: severe fat depletion, severe muscle depletion, percent weight loss (12% within the last two months) ?Percent weight loss: 12 % ? ? ? ? ?Diet:  ?Diet Order   ? ?       ?  Diet NPO time specified Except for: Ice Chips  Diet effective now       ?  ? ?  ?  ? ?  ? ? ?DVT prophylaxis:  ?heparin injection 5,000 Units Start: 08/22/21 2200 ?  ?Antimicrobials: Zosyn IV ?Fluid: D5 half NS at 50 mill per hour ?Consultants: Nephrology, IR, surgery ?Family Communication: None at bedside ? ?Status is: Inpatient ? ?Continue in-hospital care because: Continued management of bowel obstruction ?Level of care: Med-Surg  ? ?Dispo: The patient is from: Home ?             Anticipated d/c is to: Hopefully home ultimately ?             Patient currently is not medically stable to d/c. ?  Difficult to place patient No ? ? ? ? ?Infusions:  ? sodium chloride    ? sodium chloride    ? dextrose 5 % and 0.45% NaCl 50 mL/hr at 08/23/21 1803  ? piperacillin-tazobactam (ZOSYN)  IV 2.25 g (08/24/21 0533)  ? ? ?Scheduled Meds: ? sodium chloride   Intravenous Once  ? Chlorhexidine Gluconate Cloth  6 each Topical Q0600  ? cinacalcet  30 mg Oral Q T,Th,Sa-HD  ? heparin  5,000 Units Subcutaneous Q8H  ? pantoprazole (PROTONIX) IV  40 mg Intravenous Daily  ? ? ?PRN meds: ?sodium chloride, sodium chloride, alteplase, heparin, HYDROmorphone (DILAUDID) injection,  lidocaine (PF), lidocaine-prilocaine, metoprolol tartrate, pentafluoroprop-tetrafluoroeth, prochlorperazine  ? ?Antimicrobials: ?Anti-infectives (From admission, onward)  ? ? Start     Dose/Rate Route Frequency Ordered Stop  ? 08/22/21 1915  piperacillin-tazobactam (ZOSYN) IVPB 2.25 g       ? 2.25 g ?100 mL/hr over 30 Minutes Intravenous Every 8 hours 08/22/21 1902    ? ?  ? ? ?Objective: ?Vitals:  ? 08/24/21 0550 08/24/21 0906  ?BP: 135/72 134/73  ?Pulse: 90 88  ?Resp: 16 17  ?Temp: 98 ?F (36.7 ?C) 97.7 ?F (36.5 ?C)  ?SpO2: 100% 100%  ? ? ?Intake/Output Summary (Last 24 hours) at 08/24/2021 1244 ?Last data filed at 08/24/2021 [phone] ?Gross per 24 hour  ?Intake 191.23 ml  ?  Output 450 ml  ?Net -258.77 ml  ? ? ?Filed Weights  ? 08/22/21 2133  ?Weight: 58.1 kg  ? ?Weight change:  ?Body mass index is 17.86 kg/m?.  ? ?Physical Exam: ?General exam: Pleasant, middle-aged African-American male.  Pain controlled.  NG tube with greenish drainage ?Skin: No rashes, lesions or ulcers. ?HEENT: Atraumatic, normocephalic, no obvious bleeding ?Lungs: Clear to auscultation bilaterally ?CVS: Regular rate and rhythm, no murmur ?GI/Abd soft, mild diffuse tenderness, distention present, bowel sound present ?CNS: Alert, awake, oriented x3 ?Psychiatry: Sad affect ?Extremities: Mild bilateral pedal edema ? ?Data Review: I have personally reviewed the laboratory data and studies available. ? ?F/u labs ordered ?Unresulted Labs (From admission, onward)  ? ?  Start     Ordered  ? 08/24/21 0500  CBC with Differential/Platelet  Daily,   R     ?Question:  Specimen collection method  Answer:  Lab=Lab collect  ? 08/23/21 1528  ? 08/24/21 7209  Basic metabolic panel  Daily,   R     ?Question:  Specimen collection method  Answer:  Lab=Lab collect  ? 08/23/21 1528  ? 08/23/21 1903  Hepatitis B surface antibody,quantitative  (New Admission Hemo Labs (Hepatitis B))  ONCE - STAT,   STAT       ?Question:  Specimen collection method  Answer:  Lab=Lab collect  ?  08/23/21 1902  ? ?  ?  ? ?  ? ? ?Signed, ?Terrilee Croak, MD ?[facility] ?08/24/2021 ? ? ? ? ? ? ? ? ? ? ?  ?

## 2021-08-25 ENCOUNTER — Inpatient Hospital Stay (HOSPITAL_COMMUNITY): Payer: HMO

## 2021-08-25 DIAGNOSIS — K56609 Unspecified intestinal obstruction, unspecified as to partial versus complete obstruction: Secondary | ICD-10-CM | POA: Diagnosis not present

## 2021-08-25 LAB — CBC WITH DIFFERENTIAL/PLATELET
Abs Immature Granulocytes: 0.02 10*3/uL (ref 0.00–0.07)
Basophils Absolute: 0.1 10*3/uL (ref 0.0–0.1)
Basophils Relative: 2 %
Eosinophils Absolute: 0.1 10*3/uL (ref 0.0–0.5)
Eosinophils Relative: 2 %
HCT: 19.3 % — ABNORMAL LOW (ref 39.0–52.0)
Hemoglobin: 7 g/dL — ABNORMAL LOW (ref 13.0–17.0)
Immature Granulocytes: 1 %
Lymphocytes Relative: 4 %
Lymphs Abs: 0.2 10*3/uL — ABNORMAL LOW (ref 0.7–4.0)
MCH: 36.5 pg — ABNORMAL HIGH (ref 26.0–34.0)
MCHC: 36.3 g/dL — ABNORMAL HIGH (ref 30.0–36.0)
MCV: 100.5 fL — ABNORMAL HIGH (ref 80.0–100.0)
Monocytes Absolute: 1.1 10*3/uL — ABNORMAL HIGH (ref 0.1–1.0)
Monocytes Relative: 30 %
Neutro Abs: 2.3 10*3/uL (ref 1.7–7.7)
Neutrophils Relative %: 61 %
Platelets: 130 10*3/uL — ABNORMAL LOW (ref 150–400)
RBC: 1.92 MIL/uL — ABNORMAL LOW (ref 4.22–5.81)
RDW: 23.2 % — ABNORMAL HIGH (ref 11.5–15.5)
WBC: 3.7 10*3/uL — ABNORMAL LOW (ref 4.0–10.5)
nRBC: 15.1 % — ABNORMAL HIGH (ref 0.0–0.2)

## 2021-08-25 LAB — BASIC METABOLIC PANEL
Anion gap: 13 (ref 5–15)
BUN: 26 mg/dL — ABNORMAL HIGH (ref 8–23)
CO2: 26 mmol/L (ref 22–32)
Calcium: 8.3 mg/dL — ABNORMAL LOW (ref 8.9–10.3)
Chloride: 94 mmol/L — ABNORMAL LOW (ref 98–111)
Creatinine, Ser: 4.84 mg/dL — ABNORMAL HIGH (ref 0.61–1.24)
GFR, Estimated: 13 mL/min — ABNORMAL LOW (ref >60)
Glucose, Bld: 87 mg/dL (ref 70–99)
Potassium: 3.5 mmol/L (ref 3.5–5.1)
Sodium: 133 mmol/L — ABNORMAL LOW (ref 135–145)

## 2021-08-25 LAB — HEPATITIS B SURFACE ANTIBODY, QUANTITATIVE: Hep B S AB Quant (Post): 420.8 m[IU]/mL (ref >9.9)

## 2021-08-25 MED ORDER — DOCUSATE SODIUM 100 MG PO CAPS
100.0000 mg | ORAL_CAPSULE | Freq: Two times a day (BID) | ORAL | Status: DC
  Administered 2021-08-25 – 2021-08-28 (×4): 100 mg via ORAL
  Filled 2021-08-25 (×7): qty 1

## 2021-08-25 MED ORDER — BOOST / RESOURCE BREEZE PO LIQD CUSTOM
1.0000 | Freq: Three times a day (TID) | ORAL | Status: DC
  Administered 2021-08-25 – 2021-08-26 (×4): 1 via ORAL

## 2021-08-25 MED ORDER — HYDROMORPHONE HCL 1 MG/ML IJ SOLN
INTRAMUSCULAR | Status: AC
  Administered 2021-08-26: 1 mg via INTRAVENOUS
  Filled 2021-08-25: qty 1

## 2021-08-25 MED ORDER — RENA-VITE PO TABS
1.0000 | ORAL_TABLET | Freq: Every day | ORAL | Status: DC
  Administered 2021-08-25 – 2021-08-29 (×5): 1 via ORAL
  Filled 2021-08-25 (×5): qty 1

## 2021-08-25 MED ORDER — NEPRO/CARBSTEADY PO LIQD
237.0000 mL | Freq: Two times a day (BID) | ORAL | Status: DC
  Administered 2021-08-26: 237 mL via ORAL

## 2021-08-25 MED ORDER — PROSOURCE PLUS PO LIQD
30.0000 mL | Freq: Two times a day (BID) | ORAL | Status: DC
  Administered 2021-08-25 – 2021-08-30 (×8): 30 mL via ORAL
  Filled 2021-08-25 (×5): qty 30

## 2021-08-25 NOTE — Progress Notes (Addendum)
[facility] Progress Note   Subjective: Seen on HD getting 1 unit of PRBCs. Says he's had BM, wanting NG tube removed which is currently clamped. No issues with HD.   Objective Vitals:   08/25/21 0830 08/25/21 0845 08/25/21 0900 08/25/21 0907  BP: 111/63 101/68 111/72 106/73  Pulse: 76 67 77 78  Resp:      Temp:  97.7 F (36.5 C)  97.7 F (36.5 C)  TempSrc:  Oral    SpO2:      Weight:       Physical Exam General: Cachetic appearing male in NAD Heart: irreg, irreg. No M/R/G Lungs: Bilateral breath sounds decreased in bases today.  Abdomen: still distended, tender. NGT to low wall.  Extremities: Trace pitting LE edema posterior calves, ankles Dialysis Access: R AVF cannulated      Additional Objective Labs: Basic Metabolic Panel: Recent Labs  Lab 08/23/21 0621 08/24/21 0328 08/25/21 0136  NA 131* 133* 133*  K 4.4 3.9 3.5  CL 92* 96* 94*  CO2 29 27 26   GLUCOSE 85 87 87  BUN 25* 24* 26*  CREATININE 3.94* 4.35* 4.84*  CALCIUM 8.4* 8.2* 8.3*  PHOS 3.9  --   --    Liver Function Tests: Recent Labs  Lab 08/22/21 0945 08/23/21 0621  AST 38  --   ALT 22  --   ALKPHOS 173*  --   BILITOT 1.6*  --   PROT 7.7  --   ALBUMIN 2.4* 1.9*   Recent Labs  Lab 08/22/21 0945  LIPASE 29   CBC: Recent Labs  Lab 08/22/21 0945 08/23/21 0621 08/24/21 0651 08/25/21 0136  WBC 3.6* 3.5* 3.9* 3.7*  NEUTROABS 2.4  --  2.3 2.3  HGB 9.4* 7.6* 6.3* 7.0*  HCT 26.9* 21.7* 17.2* 19.3*  MCV 102.3* 100.0 97.7 100.5*  PLT 189 162 131* 130*   Blood Culture    Component Value Date/Time   SDES ABSCESS 08/06/2021 1110   SPECREQUEST NONE 08/06/2021 1110   CULT  08/06/2021 1110    FEW ESCHERICHIA COLI ABUNDANT BACTEROIDES FRAGILIS BETA LACTAMASE POSITIVE Performed at [facility], [place] [address], [place], [place] [address]    REPTSTATUS 08/09/2021 FINAL 08/06/2021 1110    Cardiac Enzymes: No results for input(s): CKTOTAL, CKMB, CKMBINDEX, TROPONINI in  the last 168 hours. CBG: No results for input(s): GLUCAP in the last 168 hours. Iron Studies: No results for input(s): IRON, TIBC, TRANSFERRIN, FERRITIN in the last 72 hours. @lablastinr3 @ Studies/Results: DG CHEST PORT 1 VIEW  Result Date: 08/24/2021 CLINICAL DATA:  Shortness of breath EXAM: PORTABLE CHEST 1 VIEW COMPARISON:  06/27/2021 FINDINGS: Transverse diameter of heart is increased. Central pulmonary vessels are prominent. There is slight prominence of interstitial markings in the lower lung fields. There is minimal blunting of both lateral CP angles. There is no pneumothorax. Enteric tube is noted in the thoracic esophagus. Tip of enteric tube is at the gastroesophageal junction with side port in the lower thoracic esophagus. IMPRESSION: Cardiomegaly. There is prominence of central pulmonary vessels and increased interstitial markings in the lower lung fields suggesting CHF. Small bilateral pleural effusions. Electronically Signed   By: Elmer Picker M.D.   On: 08/24/2021 11:51   DG Abd Portable 1V  Result Date: 08/25/2021 CLINICAL DATA:  Small-bowel obstruction EXAM: PORTABLE ABDOMEN - 1 VIEW COMPARISON:  KUB dated 1 day prior FINDINGS: The enteric catheter tip is in the stomach. The side-hole is near the level of the GE junction. There  is gaseous distention of the small bowel, overall similar to the prior study, with a loop measuring up to 3.7 cm in the left hemiabdomen. Enteric contrast is seen throughout the colon and rectum. There is no definite free intraperitoneal air. Abdominal surgical clips are again noted.  The bones are stable. IMPRESSION: 1. Overall similar degree of gaseous distention of the small bowel, though enteric contrast is seen throughout the colon and rectum. 2. The enteric catheter side hole is at the region of the GE junction. Consider advancement by 4-5 cm for more optimal placement. These results will be called to the ordering clinician or representative by the  Radiologist Assistant, and communication documented in the PACS or [employer]. Electronically Signed   By: Valetta Mole M.D.   On: 08/25/2021 08:11   DG Abd Portable 1V  Result Date: 08/24/2021 CLINICAL DATA:  Small-bowel obstruction EXAM: PORTABLE ABDOMEN - 1 VIEW COMPARISON:  08/23/2021 FINDINGS: There is abnormal dilation of small-bowel loops measuring up to 4.3 cm. Oral contrast has reached the colon. No significant changes are noted in the small bowel dilation. Surgical clips are seen in the right upper quadrant. There is contrast in the urinary bladder from previous CT. Tip of enteric tube is noted at the gastroesophageal junction. Side port in the enteric tube is seen in the lower thoracic esophagus. IMPRESSION: No significant interval changes are noted in the partial small bowel obstruction. Tip enteric tube is at the gastroesophageal junction with the side port in the lower thoracic esophagus. Enteric tube should be advanced 10 cm to place the tip and side port within the stomach. These results will be called to the ordering clinician or representative by the Radiologist Assistant, and communication documented in the PACS or [employer]. Electronically Signed   By: Elmer Picker M.D.   On: 08/24/2021 11:49   DG Abd Portable 1V  Result Date: 08/23/2021 CLINICAL DATA:  Abdominal pain, small-bowel obstruction. EXAM: PORTABLE ABDOMEN - 1 VIEW COMPARISON:  Abdominal x-ray 08/23/2021. CTA abdomen and pelvis 08/22/2021. FINDINGS: Again seen are dilated small bowel loops throughout the abdomen measuring up to 4.3 cm. Three of distention is similar to the prior study. Contrast is also seen within dilated small bowel in the right abdomen. No colonic contrast identified. No colonic gas identified. There is contrast in the bladder. Surgical clips are seen in the upper abdomen. Nasogastric tube tip in the mid stomach. IMPRESSION: 1. Stable findings compatible with high-grade distal small-bowel  obstruction. 2. Nasogastric tube tip in the mid stomach. Electronically Signed   By: Ronney Asters M.D.   On: 08/23/2021 23:37   DG Abd Portable 1V  Result Date: 08/23/2021 CLINICAL DATA:  NG placement EXAM: PORTABLE ABDOMEN - 1 VIEW COMPARISON:  08/23/2021 FINDINGS: NG has been placed with the tip in the lateral body of the stomach. Mild small bowel dilatation unchanged. IMPRESSION: NG tube in the lateral body of the stomach. Mild small bowel dilatation unchanged. Electronically Signed   By: Franchot Gallo M.D.   On: 08/23/2021 11:39   Medications:  sodium chloride     sodium chloride     dextrose 5 % and 0.45% NaCl 50 mL/hr at 08/24/21 1813   piperacillin-tazobactam (ZOSYN)  IV Stopped (08/24/21 2120)    (feeding supplement) PROSource Plus  30 mL Oral BID BM   sodium chloride   Intravenous Once   Chlorhexidine Gluconate Cloth  6 each Topical Q0600   cinacalcet  30 mg Oral Q T,Th,Sa-HD   docusate  sodium  100 mg Oral BID   feeding supplement  1 Container Oral TID BM   heparin  5,000 Units Subcutaneous Q8H   pantoprazole (PROTONIX) IV  40 mg Intravenous Daily     Dialysis Orders:  TTS - [facility])  4hrs, BFR 400, DFR 500,  EDW 52.5kg, 3K/ 2.5Ca No heparin bolus Mircera 225 mcg q2wks - last 08/19/21 Sensipar 30mg  3x weekly   Assessment/Plan: Recurrent SBO-continue conservative management, on IV Protonix and IV Compazine. Surgery following. Removing NGT today.  Sickle Cell disease: HGB 7.0. Transfusing 2 units PRBCs today.  Recent ESA as OP.  Combined systolic and diastolic HF: EF 50-53% 97/6734. LV global hypokinesis. Has been on Torsemide to help control volume between dialysis treatments. CXR done 03/09. Prominence of vessels and interstitial markings suggestive of CHF. Small pleural effusions. Extra HD tomorrow. Pt is vol overloaded and not a diabetic, will dc the D5NS at 50 cc/hr.  Persistent AF/AFL-Not on anticoagulation D/T GIB.  2.   ESRD - on HD TTS.HD  off schedule today.  Plan for short HD 08/26/2021 to resume T,Th,S and volume removal.  3.   Hypertension/volume  - BP stable. Has been on IVF, now with BLE pitting edema. Left HD 08/22/2021 6.8 kg above OP EDW. No CXR on admission. Will order. UF and lower volume as tolerated.  4.   Anemia of CKD -H/O SSD. See # 2 5.   Secondary Hyperparathyroidism -  Continue binders when able to eat. Continue Sensipar. VDRA on hold D/T high corrected calcium 6.    Nutrition - NPO, can advance to renal diet with fluid restriction when clinically stable. Albumin 1.9. Severe PCM. Consult dietician.     Rita H. Brown NP-C 08/25/2021, 9:23 AM  [facility] [phone]  Pt seen, examined and agree w assess/plan as above with additions as indicated.  [facility] 08/26/2021, 7:36 AM

## 2021-08-25 NOTE — Progress Notes (Signed)
removed 3510mls net fluid.  gave patient 2 units of blood as ordered. gave dilaudid 0.1mg  as needed for pain in abdomen.  pre bp 103/63 post bp 116/75 pre weight 59.7kg post weight 56.0kg.  2 bandages to ruva avf no bleeding dressing cdi. ?

## 2021-08-25 NOTE — Progress Notes (Signed)
?PROGRESS NOTE ? ?Patrick [name]  ?DOB: [birth year]  ?PCP: [name], MD ?VZD:[id number]  ?DOA: 08/22/2021 ? LOS: 3 days  ?Hospital Day: 4 ? ?Brief narrative: ?Patrick [name] is a 66 y.o. male with PMH significant for ESRD HD TTS, CHF, sickle cell disease, permanent A-fib, upper GI bleed due to AVMs, laparotomy/cholecystectomy/ischemic small bowel resection in 12/22 and recent hospitalization from SBO that resolved with NGT and pelvic abscess that was treated with drain placement and antibiotics. ? ?During hospitalization in December 2022, he had laparoscopy with subsequent laparotomy.  ?He was subsequently admitted twice after that.  Last hospitalized in 2/13 to 2/24 for bowel obstruction which improved with conservative measures.   ? ?Patient presented to the ED on 3/7 with complaint of severe abdominal pain for a day. ?CT scan showed diffuse small bowel dilatation suggestive of high-grade small bowel obstruction.  Surgical anastomosis in the right lower quadrant of the abdomen with continued stool like material and a transition point distal to the anastomosis.  It also showed a fluid collection in the anterior lower abdominal pelvis, measuring 4.5 cm. ? ?He was admitted to hospitalist service. ?General surgery consultation was obtained. ? ?Subjective: ?Patient was seen and examined this morning in dialysis.  Not in distress.  NG tube in place. ?Abdominal x-ray and surgical follow-up from this morning noted. ? ?Principal Problem: ?  SBO (small bowel obstruction) ([place]) ?Active Problems: ?  Chronic combined systolic and diastolic CHF (congestive heart failure) ([place]) ?  ESRD on HD TTS ?  Prolonged QT interval ?  Sickle cell anemia (H[facility]) ?  Permanent atrial fibrillation ([place]) ?  Hypothermia ?  Abdominal fluid collection ?  Anemia ?  Protein-calorie malnutrition, severe ?  Visual impairment ?  COPD not affecting current episode of care [facility]) ?  ? ?Assessment and Plan: ?3 months s/p laparotomy with  cholecystectomy ?SBO versus ileus ?-Presented with abdominal pain, nausea, vomiting. ?-CT abdomen finding as above showing SBO with transition point in RLQ. ?-Started on conservative management with bowel rest, NG decompression, IV fluid, pain management. ?-IV Protonix, IV Compazine ?-General surgery following.  Noted a plan from surgery to remove NG tube and start on clear diet today ? ?Recurrent intra-abdominal collection s/p previous drain ?-4.5 cm collection in the anterior lower abdomen/pelvis.  IR was consulted.  Per IR note, fluid collection is small in size for drainage.  Continue to monitor with antibiotics. ?-Currently on IV Zosyn. ? ?Acute anemia ?Sickle cell disease ?History of GI bleed from AVM treated with APC on 07/2021 ?-Not in sickle cell crisis clinically but hemoglobin fell down to a low of 6.3 on 3/9. ?-2 units of PRBC transfusion given with dialysis today.  Continue to monitor. ?Recent Labs  ?  05/31/21 ?0955 05/31/21 ?1518 08/11/21 ?0830 08/22/21 ?0945 08/23/21 ?2951 08/24/21 ?8841 08/25/21 ?0136  ?HGB  --    < > 9.0* 9.4* 7.6* 6.3* 7.0*  ?MCV  --    < > 98.1 102.3* 100.0 97.7 100.5*  ?FERRITIN 250  --   --   --   --   --   --   ?TIBC 232*  --   --   --   --   --   --   ?IRON 80  --   --   --   --   --   --   ? < > = values in this interval not displayed.  ? ? ?ESRD HD TTS ?-Euvolemic at this time.  Because of  persistent NG drain output, patient needs maintenance IV fluid.  Nephrology aware. ? ?Hyponatremia ?-Sodium level between 130 and 135.  Probably due to dialysis status, NG tube-, poor oral intake.  Continue to monitor ?Recent Labs  ?Lab 08/22/21 ?0945 08/23/21 ?2992 08/24/21 ?4268 08/25/21 ?0136  ?NA 132* 131* 133* 133*  ? ? ?Chronic, systolic and diastolic CHF ?-Currently remains compensated.  Chronic mild bilateral lower extremity edema.   ? ?Prolonged QT interval ?-QTc 551 ms on EKG on admission.  Minimize or avoid QT prolonging drugs ?-Repeat EKG ? ?Permanent atrial fibrillation ?-On  low-dose Coreg at home.  Currently NPO.  Continue IV metoprolol 2.5 mg as needed. ?-not on anticoagulation due to GI bleed/AVM ? ?COPD ?-Respiratory status stable.   ? ?Visual impairment ?-Reorientation and delirium precaution ? ?Protein-calorie malnutrition, severe ?-Very frail with significant muscle mass and subcu fat loss.  ?-Consult dietitian once he started taking p.o. ? ?Goals of care ?  Code Status: DNR  ? ? ?Mobility: Encourage ambulation ? ?Nutritional status:  ?Body mass index is 17.19 kg/m?Marland Kitchen  ?Nutrition Problem: Severe Malnutrition ?Etiology: chronic illness (ESRD on HD) ?Signs/Symptoms: severe fat depletion, severe muscle depletion, percent weight loss (12% within the last two months) ?Percent weight loss: 12 % ? ? ? ? ?Diet:  ?Diet Order   ? ?       ?  Diet full liquid Room service appropriate? Yes; Fluid consistency: Thin  Diet effective now       ?  ? ?  ?  ? ?  ? ? ?DVT prophylaxis:  ?heparin injection 5,000 Units Start: 08/22/21 2200 ?  ?Antimicrobials: Zosyn IV ?Fluid: D5 half NS at 50 mill per hour ?Consultants: Nephrology, IR, surgery ?Family Communication: None at bedside ? ?Status is: Inpatient ? ?Continue in-hospital care because: Continued management of bowel obstruction ?Level of care: Med-Surg  ? ?Dispo: The patient is from: Home ?             Anticipated d/c is to: Hopefully home ultimately ?             Patient currently is not medically stable to d/c. ?  Difficult to place patient No ? ? ? ? ?Infusions:  ? dextrose 5 % and 0.45% NaCl 50 mL/hr at 08/24/21 1813  ? piperacillin-tazobactam (ZOSYN)  IV 2.25 g (08/25/21 1215)  ? ? ?Scheduled Meds: ? (feeding supplement) PROSource Plus  30 mL Oral BID BM  ? sodium chloride   Intravenous Once  ? Chlorhexidine Gluconate Cloth  6 each Topical Q0600  ? cinacalcet  30 mg Oral Q T,Th,Sa-HD  ? docusate sodium  100 mg Oral BID  ? feeding supplement  1 Container Oral TID BM  ? heparin  5,000 Units Subcutaneous Q8H  ? HYDROmorphone      ? pantoprazole  (PROTONIX) IV  40 mg Intravenous Daily  ? ? ?PRN meds: ?HYDROmorphone (DILAUDID) injection, metoprolol tartrate, prochlorperazine  ? ?Antimicrobials: ?Anti-infectives (From admission, onward)  ? ? Start     Dose/Rate Route Frequency Ordered Stop  ? 08/22/21 1915  piperacillin-tazobactam (ZOSYN) IVPB 2.25 g       ? 2.25 g ?100 mL/hr over 30 Minutes Intravenous Every 8 hours 08/22/21 1902    ? ?  ? ? ?Objective: ?Vitals:  ? 08/25/21 1105 08/25/21 1145  ?BP: 100/66 118/77  ?Pulse: 69 73  ?Resp:  15  ?Temp:  (!) 97.4 ?F (36.3 ?C)  ?SpO2:  (!) 80%  ? ? ?Intake/Output Summary (Last 24 hours) at  08/25/2021 1410 ?Last data filed at 08/25/2021 1105 ?Gross per 24 hour  ?Intake 683.34 ml  ?Output 3550 ml  ?Net -2866.66 ml  ? ? ?Filed Weights  ? 08/22/21 2133 08/25/21 0723 08/25/21 1125  ?Weight: 58.1 kg 59.7 kg 55.9 kg  ? ?Weight change:  ?Body mass index is 17.19 kg/m?.  ? ?Physical Exam: ?General exam: Pleasant, middle-aged African-American male.  Pain controlled.  NG tube in place  ?skin: No rashes, lesions or ulcers. ?HEENT: Atraumatic, normocephalic, no obvious bleeding ?Lungs: Clear to auscultation bilaterally ?CVS: Regular rate and rhythm, no murmur ?GI/Abd soft, mild diffuse tenderness, distention present, bowel sound present ?CNS: Alert, awake, oriented x3 ?Psychiatry: Sad affect ?Extremities: Mild bilateral pedal edema ? ?Data Review: I have personally reviewed the laboratory data and studies available. ? ?F/u labs ordered ?Unresulted Labs (From admission, onward)  ? ?  Start     Ordered  ? 08/24/21 0500  CBC with Differential/Platelet  Daily,   R     ?Question:  Specimen collection method  Answer:  Lab=Lab collect  ? 08/23/21 1528  ? 08/24/21 1829  Basic metabolic panel  Daily,   R     ?Question:  Specimen collection method  Answer:  Lab=Lab collect  ? 08/23/21 1528  ? Signed and Held  Renal function panel  Once,   R       ?Question:  Specimen collection method  Answer:  Lab=Lab collect  ? Signed and Held  ? Signed  and Held  CBC  Once,   R       ?Question:  Specimen collection method  Answer:  Lab=Lab collect  ? Signed and Held  ? ?  ?  ? ?  ? ? ?Signed, ?Terrilee Croak, MD ?[facility] ?08/25/2021 ? ? ? ? ? ? ? ? ?

## 2021-08-25 NOTE — Progress Notes (Signed)
Progress Note     Subjective: Patient seen in HD this AM. Reports 2 large BM overnight, they were a mixture of loose and formed stool material. He is passing flatus and abdomen feels less sore and bloated. He really wants to try some cream of chicken soup today.   Objective: Vital signs in last 24 hours: Temp:  [97.3 F (36.3 C)-97.9 F (36.6 C)] 97.7 F (36.5 C) (03/10 0700) Pulse Rate:  [58-88] 59 (03/10 0800) Resp:  [16-17] 17 (03/10 0420) BP: (100-134)/(59-73) 100/61 (03/10 0800) SpO2:  [98 %-100 %] 100 % (03/10 0420) Weight:  [59.7 kg] 59.7 kg (03/10 0723) Last BM Date : 08/22/21  Intake/Output from previous day: 03/09 0701 - 03/10 0700 In: 0  Out: 250 [Urine:250] Intake/Output this shift: No intake/output data recorded.  PE: General: pleasant, WD, thin male who is laying in bed in NAD Heart: regular, rate, and rhythm.   Lungs: Respiratory effort nonlabored Abd: soft, NT, distended,+BS,  NGT clamped MS: all 4 extremities are symmetrical with no cyanosis, clubbing, or edema. Skin: warm and dry with no masses, lesions, or rashes Psych: A&Ox3 with an appropriate affect.    Lab Results:  Recent Labs    08/24/21 0651 08/25/21 0136  WBC 3.9* 3.7*  HGB 6.3* 7.0*  HCT 17.2* 19.3*  PLT 131* 130*    BMET Recent Labs    08/24/21 0328 08/25/21 0136  NA 133* 133*  K 3.9 3.5  CL 96* 94*  CO2 27 26  GLUCOSE 87 87  BUN 24* 26*  CREATININE 4.35* 4.84*  CALCIUM 8.2* 8.3*    PT/INR No results for input(s): LABPROT, INR in the last 72 hours. CMP     Component Value Date/Time   NA 133 (L) 08/25/2021 0136   NA 135 05/24/2020 1119   K 3.5 08/25/2021 0136   CL 94 (L) 08/25/2021 0136   CO2 26 08/25/2021 0136   GLUCOSE 87 08/25/2021 0136   BUN 26 (H) 08/25/2021 0136   BUN 25 05/24/2020 1119   CREATININE 4.84 (H) 08/25/2021 0136   CREATININE 2.90 (H) 08/11/2015 1359   CALCIUM 8.3 (L) 08/25/2021 0136   PROT 7.7 08/22/2021 0945   PROT 7.5 05/24/2020 1119    ALBUMIN 1.9 (L) 08/23/2021 0621   ALBUMIN 4.1 05/24/2020 1119   AST 38 08/22/2021 0945   ALT 22 08/22/2021 0945   ALKPHOS 173 (H) 08/22/2021 0945   BILITOT 1.6 (H) 08/22/2021 0945   BILITOT 2.4 (H) 05/24/2020 1119   GFRNONAA 13 (L) 08/25/2021 0136   GFRNONAA 23 (L) 08/11/2015 1359   GFRAA 24 (L) 05/24/2020 1119   GFRAA 27 (L) 08/11/2015 1359   Lipase     Component Value Date/Time   LIPASE 29 08/22/2021 0945       Studies/Results: DG CHEST PORT 1 VIEW  Result Date: 08/24/2021 CLINICAL DATA:  Shortness of breath EXAM: PORTABLE CHEST 1 VIEW COMPARISON:  06/27/2021 FINDINGS: Transverse diameter of heart is increased. Central pulmonary vessels are prominent. There is slight prominence of interstitial markings in the lower lung fields. There is minimal blunting of both lateral CP angles. There is no pneumothorax. Enteric tube is noted in the thoracic esophagus. Tip of enteric tube is at the gastroesophageal junction with side port in the lower thoracic esophagus. IMPRESSION: Cardiomegaly. There is prominence of central pulmonary vessels and increased interstitial markings in the lower lung fields suggesting CHF. Small bilateral pleural effusions. Electronically Signed   By: Elmer Picker M.D.   On:  08/24/2021 11:51   DG Abd Portable 1V  Result Date: 08/25/2021 CLINICAL DATA:  Small-bowel obstruction EXAM: PORTABLE ABDOMEN - 1 VIEW COMPARISON:  KUB dated 1 day prior FINDINGS: The enteric catheter tip is in the stomach. The side-hole is near the level of the GE junction. There is gaseous distention of the small bowel, overall similar to the prior study, with a loop measuring up to 3.7 cm in the left hemiabdomen. Enteric contrast is seen throughout the colon and rectum. There is no definite free intraperitoneal air. Abdominal surgical clips are again noted.  The bones are stable. IMPRESSION: 1. Overall similar degree of gaseous distention of the small bowel, though enteric contrast is seen  throughout the colon and rectum. 2. The enteric catheter side hole is at the region of the GE junction. Consider advancement by 4-5 cm for more optimal placement. These results will be called to the ordering clinician or representative by the Radiologist Assistant, and communication documented in the PACS or [employer]. Electronically Signed   By: Valetta Mole M.D.   On: 08/25/2021 08:11   DG Abd Portable 1V  Result Date: 08/24/2021 CLINICAL DATA:  Small-bowel obstruction EXAM: PORTABLE ABDOMEN - 1 VIEW COMPARISON:  08/23/2021 FINDINGS: There is abnormal dilation of small-bowel loops measuring up to 4.3 cm. Oral contrast has reached the colon. No significant changes are noted in the small bowel dilation. Surgical clips are seen in the right upper quadrant. There is contrast in the urinary bladder from previous CT. Tip of enteric tube is noted at the gastroesophageal junction. Side port in the enteric tube is seen in the lower thoracic esophagus. IMPRESSION: No significant interval changes are noted in the partial small bowel obstruction. Tip enteric tube is at the gastroesophageal junction with the side port in the lower thoracic esophagus. Enteric tube should be advanced 10 cm to place the tip and side port within the stomach. These results will be called to the ordering clinician or representative by the Radiologist Assistant, and communication documented in the PACS or [employer]. Electronically Signed   By: Elmer Picker M.D.   On: 08/24/2021 11:49   DG Abd Portable 1V  Result Date: 08/23/2021 CLINICAL DATA:  Abdominal pain, small-bowel obstruction. EXAM: PORTABLE ABDOMEN - 1 VIEW COMPARISON:  Abdominal x-ray 08/23/2021. CTA abdomen and pelvis 08/22/2021. FINDINGS: Again seen are dilated small bowel loops throughout the abdomen measuring up to 4.3 cm. Three of distention is similar to the prior study. Contrast is also seen within dilated small bowel in the right abdomen. No colonic  contrast identified. No colonic gas identified. There is contrast in the bladder. Surgical clips are seen in the upper abdomen. Nasogastric tube tip in the mid stomach. IMPRESSION: 1. Stable findings compatible with high-grade distal small-bowel obstruction. 2. Nasogastric tube tip in the mid stomach. Electronically Signed   By: Ronney Asters M.D.   On: 08/23/2021 23:37   DG Abd Portable 1V  Result Date: 08/23/2021 CLINICAL DATA:  NG placement EXAM: PORTABLE ABDOMEN - 1 VIEW COMPARISON:  08/23/2021 FINDINGS: NG has been placed with the tip in the lateral body of the stomach. Mild small bowel dilatation unchanged. IMPRESSION: NG tube in the lateral body of the stomach. Mild small bowel dilatation unchanged. Electronically Signed   By: Franchot Gallo M.D.   On: 08/23/2021 11:39    Anti-infectives: Anti-infectives (From admission, onward)    Start     Dose/Rate Route Frequency Ordered Stop   08/22/21 1915  piperacillin-tazobactam (ZOSYN) IVPB  2.25 g        2.25 g 100 mL/hr over 30 Minutes Intravenous Every 8 hours 08/22/21 1902          Assessment/Plan 3 months s/p ex lap with cholecystectomy, SBR SBO vs ileus  Recurrent Intra-abdominal fluid collection s/p previous drain  - CT 3/7 with sbo and possible transition point distal to small bowel anastomosis in RLQ and residual fluid collection in anterior lower abdomen/pelvis measuring 4.5 cm - SBO protocol started 3/7 and 8h delay film with ongoing SBO and no contrast in the colon, 24 h film without contrast in colon and ongoing SBO but contrast does appear to have advanced some  - repeat film yesterday AM with contrast in colon, film this AM with dilated bowel but contrast in colon/rectum - patient with return in bowel function  - start liquid diet, remove NGT this AM - IR does not feel collection is currently large enough for draining, consider repeat imaging in a few days if not improving  - goal K >4.0 and Mg >2.0 to optimize bowel function   - mobilize as tolerated   FEN - FLD, IVF @50  cc/h, supplements per dietician  VTE - SQH ID - zosyn 3/7>>   Recent UGI bleed ESRD on HD CHF Severe MR, TR A fib HTN Sickle Cell disease Anemia  I independently reviewed and agree with radiology reading of abdominal film. I reviewed CBC and BMET. I reviewed vitals and intake/output.   LOS: 3 days    Norm Parcel, [facility] Surgery 08/25/2021, 8:21 AM Please see Amion for pager number during day hours 7:00am-4:30pm

## 2021-08-25 NOTE — Progress Notes (Addendum)
Nutrition Follow-up ? ?DOCUMENTATION CODES:  ? ?Severe malnutrition in context of chronic illness, Underweight ? ?INTERVENTION:  ?Renavit daily  ?Continue Boost Breeze po TID, each supplement provides 250 kcal and 9 grams of protein ?Nepro Shake po BID, each supplement provides 425 kcal and 19 grams protein ?Encourage adequate PO intake  ? ? ?NUTRITION DIAGNOSIS:  ? ?Severe Malnutrition related to chronic illness (ESRD on HD) as evidenced by severe fat depletion, severe muscle depletion, percent weight loss (12% within the last two months). ? ?Ongoing  ? ?GOAL:  ? ?Patient will meet greater than or equal to 90% of their needs ? ?Progressing; addressing via PO intake and oral nutrition supplements  ? ?MONITOR:  ? ?PO intake, Supplement acceptance, Labs, I & O's, Weight trends ? ?REASON FOR ASSESSMENT:  ? ?Consult ?Assessment of nutrition requirement/status ? ?ASSESSMENT:  ? ?Pt is a 66 year old made with PMH significant of ESRD on HD, sickle cell disease, legally blind, A-fib, upper GI bleed due to AVMs, CHF, recent GI bleed and small bowel obstruction s/p bowel resection and open cholecystectomy and recent hospitalization from SBO resolved with NGT and pelvic abscess treated with drain placement and antibiotics who presented to the ED with progressive abdominal pain for one day and admitted for SBO. ? ?Pt is currently on a full liquid diet.  ? ?Pt last HD on 08/25/21 with net UF of 3500 mL.  ? ?Met with pt at bedside. Per pt, pt endorses a good appetite today and denies any nausea or vomiting symptoms. Pt reports that he had HD earlier this AM and that he was given 2 units of blood. Pt reports having some pain in his abdomen and on a scale of 1-10, pt reports pain around a 6 or 7. Pt reports that he is receiving pain medication. Pt reports that his last BM was today and denies any constipation. Per pt, he ate 100% of his lunch today which consisted of a cream of chicken soup, jello and ice cream. Pt also reports that  he has already ordered dinner and expecting to eat later due to having a good appetite today. Per pt, pt drank 100% of peach Boost Breeze earlier this AM and nurse had just brought another one in for pt during visit. Pt reports really liking the [facility] flavor Boost Breeze and tolerating well.  ? ?Encouraged PO intake and oral nutrition supplementation with pt. Dietetic intern to continue current nutrition plan and to continue monitoring pt's diet being advanced.  ? ?Current wt: 55.9 kg  ?Admit wt: 58.1 kg  ?EDW: 52.5 kg  ? ?Labs reviewed: Sodium: 133, BUN: 26, Creatinine: 4.84, Hemoglobin: 7.0  ? ?Medications reviewed and include: Prosource BID, Colace, Boost Breeze TID, Protonix, IV Dextrose 5% at 50 ml/hr (Dextrose containing IV fluid provides 204 kcal per day).  ? ? ?Diet Order:   ?Diet Order   ? ?       ?  Diet full liquid Room service appropriate? Yes; Fluid consistency: Thin  Diet effective now       ?  ? ?  ?  ? ?  ? ? ?EDUCATION NEEDS:  ? ?Education needs have been addressed ? ?Skin:  Skin Assessment: Skin Integrity Issues: ?Skin Integrity Issues:: Stage II, Incisions ?Stage II: mid sacrum,left sacrum ?Incisions: abdomen ? ?Last BM:  3/10; type 6 ? ?Height:  ? ?Ht Readings from Last 1 Encounters:  ?08/01/21 _0  (1.803 m)  ? ? ?Weight:  ? ?Wt Readings from Last 1  Encounters:  ?08/25/21 55.9 kg  ? ? ?Ideal Body Weight:    ? ?BMI:  Body mass index is 17.19 kg/m?. ? ?Estimated Nutritional Needs:  ? ?Kcal:  1900 - 2100 ? ?Protein:  100 - 115 grams ? ?Fluid:  1000 mL + UOP ? ? ? ?Maryruth Hancock, Dietetic Intern ?08/25/2021 3:35 PM ?

## 2021-08-25 NOTE — Progress Notes (Signed)
Mobility Specialist Progress Note  ? ? 08/25/21 1451  ?Mobility  ?Activity Ambulated with assistance in hallway  ?Level of Assistance Contact guard assist, steadying assist  ?Assistive Device  ?(IV pole)  ?Distance Ambulated (ft) 300 ft  ?Activity Response Tolerated well  ?$Mobility charge 1 Mobility  ? ?Pt received in bed and agreeable. No complaints and in good spirits. Returned to bed with RN present.  ? ?Hildred Alamin ?Mobility Specialist  ?M.S. 5N: [phone]  ?

## 2021-08-26 DIAGNOSIS — K56609 Unspecified intestinal obstruction, unspecified as to partial versus complete obstruction: Secondary | ICD-10-CM | POA: Diagnosis not present

## 2021-08-26 LAB — CBC WITH DIFFERENTIAL/PLATELET
Abs Immature Granulocytes: 0 10*3/uL (ref 0.00–0.07)
Basophils Absolute: 0 10*3/uL (ref 0.0–0.1)
Basophils Relative: 1 %
Eosinophils Absolute: 0 10*3/uL (ref 0.0–0.5)
Eosinophils Relative: 1 %
HCT: 23.6 % — ABNORMAL LOW (ref 39.0–52.0)
Hemoglobin: 8.4 g/dL — ABNORMAL LOW (ref 13.0–17.0)
Lymphocytes Relative: 7 %
Lymphs Abs: 0.2 10*3/uL — ABNORMAL LOW (ref 0.7–4.0)
MCH: 33.7 pg (ref 26.0–34.0)
MCHC: 35.6 g/dL (ref 30.0–36.0)
MCV: 94.8 fL (ref 80.0–100.0)
Monocytes Absolute: 0.2 10*3/uL (ref 0.1–1.0)
Monocytes Relative: 9 %
Neutro Abs: 1.8 10*3/uL (ref 1.7–7.7)
Neutrophils Relative %: 82 %
Platelets: 122 10*3/uL — ABNORMAL LOW (ref 150–400)
RBC: 2.49 MIL/uL — ABNORMAL LOW (ref 4.22–5.81)
RDW: 24.4 % — ABNORMAL HIGH (ref 11.5–15.5)
WBC: 2.2 10*3/uL — ABNORMAL LOW (ref 4.0–10.5)
nRBC: 17.7 % — ABNORMAL HIGH (ref 0.0–0.2)
nRBC: 49 /100 WBC — ABNORMAL HIGH

## 2021-08-26 LAB — TYPE AND SCREEN
ABO/RH(D): AB POS
Antibody Screen: NEGATIVE
Unit division: 0
Unit division: 0

## 2021-08-26 LAB — BPAM RBC
Blood Product Expiration Date: 202303282359
Blood Product Expiration Date: 202303302359
ISSUE DATE / TIME: 202303100834
ISSUE DATE / TIME: 202303100834
Unit Type and Rh: 5100
Unit Type and Rh: 5100

## 2021-08-26 LAB — BASIC METABOLIC PANEL
Anion gap: 9 (ref 5–15)
BUN: 14 mg/dL (ref 8–23)
CO2: 25 mmol/L (ref 22–32)
Calcium: 7.9 mg/dL — ABNORMAL LOW (ref 8.9–10.3)
Chloride: 99 mmol/L (ref 98–111)
Creatinine, Ser: 2.86 mg/dL — ABNORMAL HIGH (ref 0.61–1.24)
GFR, Estimated: 24 mL/min — ABNORMAL LOW (ref >60)
Glucose, Bld: 90 mg/dL (ref 70–99)
Potassium: 3.2 mmol/L — ABNORMAL LOW (ref 3.5–5.1)
Sodium: 133 mmol/L — ABNORMAL LOW (ref 135–145)

## 2021-08-26 MED ORDER — PENTAFLUOROPROP-TETRAFLUOROETH EX AERO: 1.0000 "application " | INHALATION_SPRAY | CUTANEOUS | Status: DC | PRN

## 2021-08-26 MED ORDER — LIDOCAINE HCL (PF) 1 % IJ SOLN
5.0000 mL | INTRAMUSCULAR | Status: DC | PRN
Start: 2021-08-26 — End: 2021-08-26

## 2021-08-26 MED ORDER — HEPARIN SODIUM (PORCINE) 1000 UNIT/ML DIALYSIS: 1000.0000 [IU] | INTRAMUSCULAR | Status: DC | PRN

## 2021-08-26 MED ORDER — SODIUM CHLORIDE 0.9 % IV SOLN: 100.0000 mL | INTRAVENOUS | Status: DC | PRN

## 2021-08-26 MED ORDER — HYDROMORPHONE HCL 1 MG/ML IJ SOLN
INTRAMUSCULAR | Status: AC
  Filled 2021-08-26: qty 1

## 2021-08-26 MED ORDER — LIDOCAINE HCL (PF) 1 % IJ SOLN: 5.0000 mL | INTRAMUSCULAR | Status: DC | PRN

## 2021-08-26 MED ORDER — LIDOCAINE-PRILOCAINE 2.5-2.5 % EX CREA: 1.0000 "application " | TOPICAL_CREAM | CUTANEOUS | Status: DC | PRN

## 2021-08-26 MED ORDER — ALTEPLASE 2 MG IJ SOLR: 2.0000 mg | Freq: Once | INTRAMUSCULAR | Status: DC | PRN

## 2021-08-26 NOTE — Progress Notes (Signed)
Patient ID: Patrick [name], male   DOB: 11/24/1955, 66 y.o.   MRN: [id number] [facility] Progress Note:   * No surgery found *  Subjective: Mental status is alert and appropriate.  Complaints occasional pain. Objective: Vital signs in last 24 hours: Temp:  [97.2 F (36.2 C)-98.7 F (37.1 C)] 98.7 F (37.1 C) (03/11 0459) Pulse Rate:  [55-94] 81 (03/11 0459) Resp:  [15-19] 19 (03/11 0459) BP: (97-120)/(63-77) 97/72 (03/11 0459) SpO2:  [80 %-100 %] 100 % (03/11 0459) Weight:  [55.9 kg] 55.9 kg (03/10 1125)  Intake/Output from previous day: 03/10 0701 - 03/11 0700 In: 803.3 [P.O.:120; Blood:683.3] Out: 3500  Intake/Output this shift: No intake/output data recorded.  Physical Exam: Work of breathing is normal.  Passing flatus.  Occasional pain but not as intense.   Lab Results:  Results for orders placed or performed during the hospital encounter of 08/22/21 (from the past 48 hour(s))  Prepare RBC (crossmatch)     Status: None   Collection Time: 08/24/21 11:06 AM  Result Value Ref Range   Order Confirmation      ORDER PROCESSED BY BLOOD BANK Performed at [facility], [address], [place], [place] [address]   Type and screen [place]     Status: None (Preliminary result)   Collection Time: 08/24/21 11:06 AM  Result Value Ref Range   ABO/RH(D) AB POS    Antibody Screen NEG    Sample Expiration 08/27/2021,2359    Unit Number [id number]    Blood Component Type RED CELLS,LR    Unit division 00    Status of Unit ISSUED    Transfusion Status OK TO TRANSFUSE    Crossmatch Result Compatible    Donor AG Type      NEGATIVE FOR C ANTIGEN NEGATIVE FOR E ANTIGEN NEGATIVE FOR KELL ANTIGEN Performed at [facility], [place] [address], [place], [place] [address]    Unit Number [id number]    Blood Component Type RED CELLS,LR    Unit division 00    Status of Unit ISSUED    Transfusion Status OK TO TRANSFUSE    Crossmatch Result  Compatible    Donor AG Type      NEGATIVE FOR C ANTIGEN NEGATIVE FOR E ANTIGEN NEGATIVE FOR KELL ANTIGEN  CBC with Differential/Platelet     Status: Abnormal   Collection Time: 08/25/21  1:36 AM  Result Value Ref Range   WBC 3.7 (L) 4.0 - 10.5 K/uL   RBC 1.92 (L) 4.22 - 5.81 MIL/uL   Hemoglobin 7.0 (L) 13.0 - 17.0 g/dL   HCT 19.3 (L) 39.0 - 52.0 %   MCV 100.5 (H) 80.0 - 100.0 fL   MCH 36.5 (H) 26.0 - 34.0 pg   MCHC 36.3 (H) 30.0 - 36.0 g/dL   RDW 23.2 (H) 11.5 - 15.5 %   Platelets 130 (L) 150 - 400 K/uL   nRBC 15.1 (H) 0.0 - 0.2 %   Neutrophils Relative % 61 %   Neutro Abs 2.3 1.7 - 7.7 K/uL   Lymphocytes Relative 4 %   Lymphs Abs 0.2 (L) 0.7 - 4.0 K/uL   Monocytes Relative 30 %   Monocytes Absolute 1.1 (H) 0.1 - 1.0 K/uL   Eosinophils Relative 2 %   Eosinophils Absolute 0.1 0.0 - 0.5 K/uL   Basophils Relative 2 %   Basophils Absolute 0.1 0.0 - 0.1 K/uL   WBC Morphology MORPHOLOGY UNREMARKABLE    Smear Review  MORPHOLOGY UNREMARKABLE    Immature Granulocytes 1 %   Abs Immature Granulocytes 0.02 0.00 - 0.07 K/uL   Pappenheimer Bodies PRESENT    Polychromasia PRESENT    Target Cells PRESENT     Comment: Performed at [facility], [place] [address] [place], [place] [address]  Basic metabolic panel     Status: Abnormal   Collection Time: 08/25/21  1:36 AM  Result Value Ref Range   Sodium 133 (L) 135 - 145 mmol/L   Potassium 3.5 3.5 - 5.1 mmol/L   Chloride 94 (L) 98 - 111 mmol/L   CO2 26 22 - 32 mmol/L   Glucose, Bld 87 70 - 99 mg/dL    Comment: Glucose reference range applies only to samples taken after fasting for at least 8 hours.   BUN 26 (H) 8 - 23 mg/dL   Creatinine, Ser 4.84 (H) 0.61 - 1.24 mg/dL   Calcium 8.3 (L) 8.9 - 10.3 mg/dL   GFR, Estimated 13 (L) >60 mL/min    Comment: (NOTE) Calculated using the CKD-EPI Creatinine Equation (2021)    Anion gap 13 5 - 15    Comment: Performed at [place] [address] [place], [place] [address]  CBC with  Differential/Platelet     Status: Abnormal   Collection Time: 08/26/21  2:15 AM  Result Value Ref Range   WBC 2.2 (L) 4.0 - 10.5 K/uL    Comment: WHITE COUNT CONFIRMED ON SMEAR   RBC 2.49 (L) 4.22 - 5.81 MIL/uL   Hemoglobin 8.4 (L) 13.0 - 17.0 g/dL   HCT 23.6 (L) 39.0 - 52.0 %   MCV 94.8 80.0 - 100.0 fL   MCH 33.7 26.0 - 34.0 pg   MCHC 35.6 30.0 - 36.0 g/dL   RDW 24.4 (H) 11.5 - 15.5 %   Platelets 122 (L) 150 - 400 K/uL   nRBC 17.7 (H) 0.0 - 0.2 %   Neutrophils Relative % 82 %   Neutro Abs 1.8 1.7 - 7.7 K/uL   Lymphocytes Relative 7 %   Lymphs Abs 0.2 (L) 0.7 - 4.0 K/uL   Monocytes Relative 9 %   Monocytes Absolute 0.2 0.1 - 1.0 K/uL   Eosinophils Relative 1 %   Eosinophils Absolute 0.0 0.0 - 0.5 K/uL   Basophils Relative 1 %   Basophils Absolute 0.0 0.0 - 0.1 K/uL   nRBC 49 (H) 0 /100 WBC   Abs Immature Granulocytes 0.00 0.00 - 0.07 K/uL   Acanthocytes PRESENT    Pappenheimer Bodies PRESENT    Polychromasia PRESENT    Target Cells PRESENT     Comment: Performed at [facility], [place] [address] [place],  [address]  Basic metabolic panel     Status: Abnormal   Collection Time: 08/26/21  2:15 AM  Result Value Ref Range   Sodium 133 (L) 135 - 145 mmol/L   Potassium 3.2 (L) 3.5 - 5.1 mmol/L   Chloride 99 98 - 111 mmol/L   CO2 25 22 - 32 mmol/L   Glucose, Bld 90 70 - 99 mg/dL    Comment: Glucose reference range applies only to samples taken after fasting for at least 8 hours.   BUN 14 8 - 23 mg/dL   Creatinine, Ser 2.86 (H) 0.61 - 1.24 mg/dL    Comment: DELTA CHECK NOTED   Calcium 7.9 (L) 8.9 - 10.3 mg/dL   GFR, Estimated 24 (L) >60 mL/min    Comment: (NOTE) Calculated using  the CKD-EPI Creatinine Equation (2021)    Anion gap 9 5 - 15    Comment: Performed at [facility], [place] [address], [place], [place] [address]    Radiology/Results: DG CHEST PORT 1 VIEW  Result Date: 08/24/2021 CLINICAL DATA:  Shortness of breath EXAM: PORTABLE CHEST 1 VIEW  COMPARISON:  06/27/2021 FINDINGS: Transverse diameter of heart is increased. Central pulmonary vessels are prominent. There is slight prominence of interstitial markings in the lower lung fields. There is minimal blunting of both lateral CP angles. There is no pneumothorax. Enteric tube is noted in the thoracic esophagus. Tip of enteric tube is at the gastroesophageal junction with side port in the lower thoracic esophagus. IMPRESSION: Cardiomegaly. There is prominence of central pulmonary vessels and increased interstitial markings in the lower lung fields suggesting CHF. Small bilateral pleural effusions. Electronically Signed   By: Elmer Picker M.D.   On: 08/24/2021 11:51   DG Abd Portable 1V  Result Date: 08/25/2021 CLINICAL DATA:  Small-bowel obstruction EXAM: PORTABLE ABDOMEN - 1 VIEW COMPARISON:  KUB dated 1 day prior FINDINGS: The enteric catheter tip is in the stomach. The side-hole is near the level of the GE junction. There is gaseous distention of the small bowel, overall similar to the prior study, with a loop measuring up to 3.7 cm in the left hemiabdomen. Enteric contrast is seen throughout the colon and rectum. There is no definite free intraperitoneal air. Abdominal surgical clips are again noted.  The bones are stable. IMPRESSION: 1. Overall similar degree of gaseous distention of the small bowel, though enteric contrast is seen throughout the colon and rectum. 2. The enteric catheter side hole is at the region of the GE junction. Consider advancement by 4-5 cm for more optimal placement. These results will be called to the ordering clinician or representative by the Radiologist Assistant, and communication documented in the PACS or [employer]. Electronically Signed   By: Valetta Mole M.D.   On: 08/25/2021 08:11   DG Abd Portable 1V  Result Date: 08/24/2021 CLINICAL DATA:  Small-bowel obstruction EXAM: PORTABLE ABDOMEN - 1 VIEW COMPARISON:  08/23/2021 FINDINGS: There is abnormal  dilation of small-bowel loops measuring up to 4.3 cm. Oral contrast has reached the colon. No significant changes are noted in the small bowel dilation. Surgical clips are seen in the right upper quadrant. There is contrast in the urinary bladder from previous CT. Tip of enteric tube is noted at the gastroesophageal junction. Side port in the enteric tube is seen in the lower thoracic esophagus. IMPRESSION: No significant interval changes are noted in the partial small bowel obstruction. Tip enteric tube is at the gastroesophageal junction with the side port in the lower thoracic esophagus. Enteric tube should be advanced 10 cm to place the tip and side port within the stomach. These results will be called to the ordering clinician or representative by the Radiologist Assistant, and communication documented in the PACS or [employer]. Electronically Signed   By: Elmer Picker M.D.   On: 08/24/2021 11:49    Anti-infectives: Anti-infectives (From admission, onward)    Start     Dose/Rate Route Frequency Ordered Stop   08/22/21 1915  piperacillin-tazobactam (ZOSYN) IVPB 2.25 g        2.25 g 100 mL/hr over 30 Minutes Intravenous Every 8 hours 08/22/21 1902         Assessment/Plan: Problem List: Patient Active Problem List   Diagnosis Date Noted   Hypothermia 08/22/2021  Abdominal fluid collection 08/22/2021   Gastric AVM    Hiatal hernia    Pressure injury of skin 08/01/2021   Hyponatremia 07/31/2021   Hypomagnesemia 07/31/2021   Upper GI bleeding 06/27/2021   H/O major abdominal surgery 06/27/2021   Visual impairment 06/27/2021   COPD not affecting current episode of care [facility]) 06/27/2021   Empyema of gallbladder 05/27/2021   Acute gangrenous cholecystitis s/p open cholcystectomy 05/24/2021 05/27/2021   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 05/24/2021 05/27/2021   Acute blood loss anemia    Protein-calorie malnutrition, severe 05/23/2021   SBO (small bowel  obstruction) ([place]) 05/19/2021   Acute upper GI bleed 08/02/2020   Prolonged QT interval 08/02/2020   Upper GI bleed 08/02/2020   Hematemesis with nausea    Chronic anticoagulation    Tobacco dependence 05/28/2020   Muscle spasm of back 85/46/2703   Chronic systolic CHF (congestive heart failure) ([place]) 09/11/2019   Hypoxia 06/02/2018   Symptomatic anemia    Chronic heart failure with preserved ejection fraction ([place])    Colon cancer screening 07/16/2017   Encounter for therapeutic drug monitoring 12/29/2015   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   Permanent atrial fibrillation ([place]) 12/24/2015   Cough with sputum 10/25/2015   Muscle tightness-Right arm  05/27/2014   Arm pain, right 05/06/2014   ESRD on HD TTS 05/06/2014   Chest pain 01/13/2014   Vitamin D deficiency 09/25/2013   CKD (chronic kidney disease), stage IV ([facility]) 09/25/2013   Elevated uric acid in blood 09/25/2013   Hb-SS disease without crisis ([place]) 09/25/2013   Anemia 09/25/2013   Onychomycosis of toenail 08/27/2013   Hyperglycemia 08/27/2013   CHF (congestive heart failure) ([place]) 08/19/2013   Nausea & vomiting 02/07/2013   Sickle cell anemia ([place]) 11/21/2012   Abdominal pain 01/17/2012   NSVT (nonsustained ventricular tachycardia) 10/21/2011   Gout 10/19/2011   Sickle cell crisis ([place]) 10/17/2011   Chronic combined systolic and diastolic CHF (congestive heart failure) ([place]) 10/17/2011   Essential hypertension 06/07/2011    ESRD on HD,  pain is better.   * No surgery found *    LOS: 4 days   Matt B. Hassell Done, MD, [facility] [phone] to reach the surgeon on call.    08/26/2021 8:13 AM

## 2021-08-26 NOTE — Progress Notes (Signed)
PROGRESS NOTE  Patrick [name]  DOB: October 11, 1955  PCP: [name], MD OTL:[id number]  DOA: 08/22/2021  LOS: 4 days  Hospital Day: 5  Brief narrative: Patrick [name] is a 66 y.o. male with PMH significant for ESRD HD TTS, CHF, sickle cell disease, permanent A-fib, upper GI bleed due to AVMs, laparotomy/cholecystectomy/ischemic small bowel resection in 12/22 and recent hospitalization from SBO that resolved with NGT and pelvic abscess that was treated with drain placement and antibiotics.  During hospitalization in December 2022, he had laparoscopy with subsequent laparotomy.  He was subsequently admitted twice after that.  Last hospitalized in 2/13 to 2/24 for bowel obstruction which improved with conservative measures.    Patient presented to the ED on 3/7 with complaint of severe abdominal pain for a day. CT scan showed diffuse small bowel dilatation suggestive of high-grade small bowel obstruction.  Surgical anastomosis in the right lower quadrant of the abdomen with continued stool like material and a transition point distal to the anastomosis.  It also showed a fluid collection in the anterior lower abdominal pelvis, measuring 4.5 cm.  He was admitted to hospitalist service. General surgery consultation was obtained.  Subjective: Seen and examined this morning.  Feels better.  He states he had a 'large large large' bowel movement last night.  Last bowel movement this morning.  Able to tolerate liquid diet at this time.  Principal Problem:   SBO (small bowel obstruction) (HCC) Active Problems:   Chronic combined systolic and diastolic CHF (congestive heart failure) (HCC)   ESRD on HD TTS   Prolonged QT interval   Sickle cell anemia (HCC)   Permanent atrial fibrillation (HCC)   Hypothermia   Abdominal fluid collection   Anemia   Protein-calorie malnutrition, severe   Visual impairment   COPD not affecting current episode of care [facility])    Assessment and  Plan: 3 months s/p laparotomy with cholecystectomy SBO versus ileus -Presented with abdominal pain, nausea, vomiting. -CT abdomen finding as above showing SBO with transition point in RLQ. -Started on conservative management with bowel rest, NG decompression, IV fluid, pain management, IV Protonix, IV Compazine -Clinically improved.  NG tube removed.  Last bowel movement this morning.  Able to tolerate liquid diet.  Maintain on liquid diet for now.  Recurrent intra-abdominal collection s/p previous drain -4.5 cm collection in the anterior lower abdomen/pelvis.  IR was consulted.  Per IR note, fluid collection is small in size for drainage.  Continue to monitor with antibiotics. -Currently on IV Zosyn.  Acute anemia Sickle cell disease History of GI bleed from AVM treated with APC on 07/2021 -Not in sickle cell crisis clinically but hemoglobin fell down to a low of 6.3 on 3/9. -2 units of PRBC transfusion given with dialysis on 3/10.  Hemoglobin better at 8.4 today. Recent Labs    05/31/21 0955 05/31/21 1518 08/22/21 0945 08/23/21 0621 08/24/21 0651 08/25/21 0136 08/26/21 0215  HGB  --    < > 9.4* 7.6* 6.3* 7.0* 8.4*  MCV  --    < > 102.3* 100.0 97.7 100.5* 94.8  FERRITIN 250  --   --   --   --   --   --   TIBC 232*  --   --   --   --   --   --   IRON 80  --   --   --   --   --   --    < > = values  in this interval not displayed.    ESRD HD TTS -Euvolemic at this time.  Nephrology plans for short session today to resume him back on TTS starting 3/14.    Hyponatremia -Sodium level between 130 and 135.  Probably due to dialysis status, NG tube-, poor oral intake.  Continue to monitor Recent Labs  Lab 08/22/21 0945 08/23/21 0621 08/24/21 0328 08/25/21 0136 08/26/21 0215  NA 132* 131* 133* 133* 133*    Chronic, systolic and diastolic CHF -Currently remains compensated.  Chronic mild bilateral lower extremity edema.    Prolonged QT interval -QTc 551 ms on EKG on  admission.  Minimize or avoid QT prolonging drugs -Repeat EKG  Permanent atrial fibrillation -On low-dose Coreg at home.  Currently NPO.  Continue IV metoprolol 2.5 mg as needed. -not on anticoagulation due to GI bleed/AVM  COPD -Respiratory status stable.    Visual impairment -Reorientation and delirium precaution  Protein-calorie malnutrition, severe -Very frail with significant muscle mass and subcu fat loss.  -Consult dietitian once he started taking p.o.  Goals of care   Code Status: DNR    Mobility: Encourage ambulation  Nutritional status:  Body mass index is 18.45 kg/m.  Nutrition Problem: Severe Malnutrition Etiology: chronic illness (ESRD on HD) Signs/Symptoms: severe fat depletion, severe muscle depletion, percent weight loss (12% within the last two months) Percent weight loss: 12 %     Diet:  Diet Order             Diet full liquid Room service appropriate? Yes; Fluid consistency: Thin  Diet effective now                   DVT prophylaxis:  heparin injection 5,000 Units Start: 08/22/21 2200   Antimicrobials: Zosyn IV Fluid: IV fluids stopped Consultants: Nephrology, IR, surgery Family Communication: None at bedside  Status is: Inpatient  Continue in-hospital care because: Gradually improving small bowel obstruction Level of care: Med-Surg   Dispo: The patient is from: Home              Anticipated d/c is to: Hopefully home tomorrow              Patient currently is not medically stable to d/c.   Difficult to place patient No     Infusions:   sodium chloride     sodium chloride     sodium chloride     sodium chloride     sodium chloride     sodium chloride     piperacillin-tazobactam (ZOSYN)  IV 2.25 g (08/26/21 1134)    Scheduled Meds:  (feeding supplement) PROSource Plus  30 mL Oral BID BM   sodium chloride   Intravenous Once   Chlorhexidine Gluconate Cloth  6 each Topical Q0600   cinacalcet  30 mg Oral Q T,Th,Sa-HD    docusate sodium  100 mg Oral BID   feeding supplement  1 Container Oral TID BM   feeding supplement (NEPRO CARB STEADY)  237 mL Oral BID BM   heparin  5,000 Units Subcutaneous Q8H   HYDROmorphone       multivitamin  1 tablet Oral QHS   pantoprazole (PROTONIX) IV  40 mg Intravenous Daily    PRN meds: sodium chloride, sodium chloride, sodium chloride, sodium chloride, sodium chloride, sodium chloride, alteplase, heparin, HYDROmorphone (DILAUDID) injection, lidocaine (PF), lidocaine-prilocaine, metoprolol tartrate, pentafluoroprop-tetrafluoroeth, prochlorperazine   Antimicrobials: Anti-infectives (From admission, onward)    Start     Dose/Rate Route Frequency Ordered Stop  08/22/21 1915  piperacillin-tazobactam (ZOSYN) IVPB 2.25 g        2.25 g 100 mL/hr over 30 Minutes Intravenous Every 8 hours 08/22/21 1902         Objective: Vitals:   08/26/21 1400 08/26/21 1430  BP: 91/60 (!) 88/65  Pulse: 87 94  Resp:    Temp:    SpO2:      Intake/Output Summary (Last 24 hours) at 08/26/2021 1437 Last data filed at 08/26/2021 0854 Gross per 24 hour  Intake 360 ml  Output --  Net 360 ml    Filed Weights   08/25/21 1125 08/26/21 1200 08/26/21 1235  Weight: 55.9 kg 60 kg 60 kg   Weight change:  Body mass index is 18.45 kg/m.   Physical Exam: General exam: Pleasant, middle-aged African-American male.  Pain controlled.  skin: No rashes, lesions or ulcers. HEENT: Atraumatic, normocephalic, no obvious bleeding Lungs: Clear to auscultation bilaterally CVS: Regular rate and rhythm, no murmur GI/Abd soft, improved abdominal distention and tenderness.  Bowel sound present. CNS: Alert, awake, oriented x3 Psychiatry: Sad affect Extremities: Mild bilateral pedal edema  Data Review: I have personally reviewed the laboratory data and studies available.  F/u labs ordered Unresulted Labs (From admission, onward)     Start     Ordered   08/26/21 1346  CBC  Once,   R       Question:   Specimen collection method  Answer:  Lab=Lab collect   08/26/21 1345   08/24/21 0500  CBC with Differential/Platelet  Daily,   R     Question:  Specimen collection method  Answer:  Lab=Lab collect   08/23/21 1528   Signed and Held  Renal function panel  Once,   R       Question:  Specimen collection method  Answer:  Lab=Lab collect   Signed and Held            Signed, Terrilee Croak, MD [facility] 08/26/2021

## 2021-08-26 NOTE — Plan of Care (Signed)

## 2021-08-26 NOTE — Progress Notes (Signed)
removed 4000 mls net fluid. periods of hypotension without symptom,  pt complained of pain mid tx given 0.1mg  dilaudid as ordered.  pre bp 88/50 post bp 92/68  pre weight 60.0kg post weight 56.0kg bed scales.  gave sensipar as ordered 2 bandages to rua avf no bleeding dressing cdi. ? ?

## 2021-08-26 NOTE — Progress Notes (Addendum)
?[facility] ?Progress Note  ? ?Subjective: seen in room. Says a doctor said he could go home today but has no transportation to leave hospital until tomorrow and still on liquid diet. Diet has been advanced for full liquid. He is hungry and this is his current high priority.  ? ?HD later today on schedule.  ? ?Objective ?Vitals:  ? 08/25/21 1726 08/25/21 2050 08/26/21 0459 08/26/21 0826  ?BP: 114/67 101/68 97/72 117/63  ?Pulse: 94 77 81 80  ?Resp: 15 17 19 18   ?Temp: (!) 97.2 ?F (36.2 ?C) 97.6 ?F (36.4 ?C) 98.7 ?F (37.1 ?C) 98.2 ?F (36.8 ?C)  ?TempSrc: Oral Oral Oral   ?SpO2: 100% 100% 100% 100%  ?Weight:      ? ?Physical Exam ?General: Cachetic appearing male in NAD ?Heart: irreg, irreg. No M/R/G ?Lungs: Bilateral breath sounds decreased in bases today.  ?Abdomen: still distended, tender. ?Extremities: Trace pitting LE edema posterior calves, ankles ?Dialysis Access: R AVF + T/B ? ? ?Additional Objective ?Labs: ?Basic Metabolic Panel: ?Recent Labs  ?Lab 08/23/21 ?0621 08/24/21 ?0328 08/25/21 ?0136 08/26/21 ?0215  ?NA 131* 133* 133* 133*  ?K 4.4 3.9 3.5 3.2*  ?CL 92* 96* 94* 99  ?CO2 29 27 26 25   ?GLUCOSE 85 87 87 90  ?BUN 25* 24* 26* 14  ?CREATININE 3.94* 4.35* 4.84* 2.86*  ?CALCIUM 8.4* 8.2* 8.3* 7.9*  ?PHOS 3.9  --   --   --   ? ?Liver Function Tests: ?Recent Labs  ?Lab 08/22/21 ?0945 08/23/21 ?2376  ?AST 38  --   ?ALT 22  --   ?ALKPHOS 173*  --   ?BILITOT 1.6*  --   ?PROT 7.7  --   ?ALBUMIN 2.4* 1.9*  ? ?Recent Labs  ?Lab 08/22/21 ?0945  ?LIPASE 29  ? ?CBC: ?Recent Labs  ?Lab 08/22/21 ?2831 08/23/21 ?5176 08/24/21 ?1607 08/25/21 ?0136 08/26/21 ?0215  ?WBC 3.6* 3.5* 3.9* 3.7* 2.2*  ?NEUTROABS 2.4  --  2.3 2.3 1.8  ?HGB 9.4* 7.6* 6.3* 7.0* 8.4*  ?HCT 26.9* 21.7* 17.2* 19.3* 23.6*  ?MCV 102.3* 100.0 97.7 100.5* 94.8  ?PLT 189 162 131* 130* 122*  ? ?Blood Culture ?   ?Component Value Date/Time  ? SDES ABSCESS 08/06/2021 1110  ? Echo NONE 08/06/2021 1110  ? CULT  08/06/2021 1110  ?  FEW  ESCHERICHIA COLI ?ABUNDANT BACTEROIDES FRAGILIS ?BETA LACTAMASE POSITIVE ?Performed at [facility], [place] [address], [place], [place] [address] ?  ? REPTSTATUS 08/09/2021 FINAL 08/06/2021 1110  ? ? ?Cardiac Enzymes: ?No results for input(s): CKTOTAL, CKMB, CKMBINDEX, TROPONINI in the last 168 hours. ?CBG: ?No results for input(s): GLUCAP in the last 168 hours. ?Iron Studies: No results for input(s): IRON, TIBC, TRANSFERRIN, FERRITIN in the last 72 hours. ?@lablastinr3 @ ?Studies/Results: ?DG CHEST PORT 1 VIEW ? ?Result Date: 08/24/2021 ?CLINICAL DATA:  Shortness of breath EXAM: PORTABLE CHEST 1 VIEW COMPARISON:  06/27/2021 FINDINGS: Transverse diameter of heart is increased. Central pulmonary vessels are prominent. There is slight prominence of interstitial markings in the lower lung fields. There is minimal blunting of both lateral CP angles. There is no pneumothorax. Enteric tube is noted in the thoracic esophagus. Tip of enteric tube is at the gastroesophageal junction with side port in the lower thoracic esophagus. IMPRESSION: Cardiomegaly. There is prominence of central pulmonary vessels and increased interstitial markings in the lower lung fields suggesting CHF. Small bilateral pleural effusions. Electronically Signed   By: Elmer Picker M.D.   On: 08/24/2021 11:51  ? ?  DG Abd Portable 1V ? ?Result Date: 08/25/2021 ?CLINICAL DATA:  Small-bowel obstruction EXAM: PORTABLE ABDOMEN - 1 VIEW COMPARISON:  KUB dated 1 day prior FINDINGS: The enteric catheter tip is in the stomach. The side-hole is near the level of the GE junction. There is gaseous distention of the small bowel, overall similar to the prior study, with a loop measuring up to 3.7 cm in the left hemiabdomen. Enteric contrast is seen throughout the colon and rectum. There is no definite free intraperitoneal air. Abdominal surgical clips are again noted.  The bones are stable. IMPRESSION: 1. Overall similar degree of gaseous distention of the  small bowel, though enteric contrast is seen throughout the colon and rectum. 2. The enteric catheter side hole is at the region of the GE junction. Consider advancement by 4-5 cm for more optimal placement. These results will be called to the ordering clinician or representative by the Radiologist Assistant, and communication documented in the PACS or [employer]. Electronically Signed   By: Valetta Mole M.D.   On: 08/25/2021 08:11  ? ?DG Abd Portable 1V ? ?Result Date: 08/24/2021 ?CLINICAL DATA:  Small-bowel obstruction EXAM: PORTABLE ABDOMEN - 1 VIEW COMPARISON:  08/23/2021 FINDINGS: There is abnormal dilation of small-bowel loops measuring up to 4.3 cm. Oral contrast has reached the colon. No significant changes are noted in the small bowel dilation. Surgical clips are seen in the right upper quadrant. There is contrast in the urinary bladder from previous CT. Tip of enteric tube is noted at the gastroesophageal junction. Side port in the enteric tube is seen in the lower thoracic esophagus. IMPRESSION: No significant interval changes are noted in the partial small bowel obstruction. Tip enteric tube is at the gastroesophageal junction with the side port in the lower thoracic esophagus. Enteric tube should be advanced 10 cm to place the tip and side port within the stomach. These results will be called to the ordering clinician or representative by the Radiologist Assistant, and communication documented in the PACS or [employer]. Electronically Signed   By: Elmer Picker M.D.   On: 08/24/2021 11:49   ?Medications: ? piperacillin-tazobactam (ZOSYN)  IV 2.25 g (08/26/21 0550)  ? ? (feeding supplement) PROSource Plus  30 mL Oral BID BM  ? sodium chloride   Intravenous Once  ? Chlorhexidine Gluconate Cloth  6 each Topical Q0600  ? cinacalcet  30 mg Oral Q T,Th,Sa-HD  ? docusate sodium  100 mg Oral BID  ? feeding supplement  1 Container Oral TID BM  ? feeding supplement (NEPRO CARB STEADY)  237 mL Oral  BID BM  ? heparin  5,000 Units Subcutaneous Q8H  ? multivitamin  1 tablet Oral QHS  ? pantoprazole (PROTONIX) IV  40 mg Intravenous Daily  ? ? ? ?Dialysis Orders:  ?TTS - [place] ([place]) ? 4hrs, BFR 400, DFR 500,  EDW 52.5kg, 3K/ 2.5Ca ?No heparin bolus ?Mircera 225 mcg q2wks - last 08/19/21 ?Sensipar 30mg  3x weekly ?  ?Assessment/Plan: ?Recurrent SBO-continue conservative management, on IV Protonix and IV Compazine. Surgery following.   ?Sickle Cell disease: HGB 8.4 08/26/2021. Transfused 2 units PRBCs t03/03/2022. Recent ESA as OP.  ?Combined systolic and diastolic HF: EF 57-32% 20/2542. LV global hypokinesis. Has been on Torsemide to help control volume between dialysis treatments. CXR done 03/09. Prominence of vessels and interstitial markings suggestive of CHF. Small pleural effusions. Extra HD 08/25/2021. Continue to optimize volume with HD.   ?Persistent AF/AFL-Not on anticoagulation D/T GIB.  ?  2.   ESRD - on HD TTS.HD off schedule today.  Plan for short HD 08/26/2021 to resume T,Th,S and volume removal. Next HD 08/29/2021 on schedule.  ?3.   Hypertension/volume  - BP stable. CXR 08/24/2021 suggestive of CHF. Has been on IVF, now with BLE pitting edema. Left OP HD 08/22/2021 6.8 kg above OP EDW. HD 08/25/2021 with next UF 3.5 liters. Still 3.4 kgs above OP EDW. UF and lower volume as tolerated.  ?4.   Anemia of CKD -H/O SSD. See # 2 ?5.   Secondary Hyperparathyroidism -  Continue binders when able to eat. Continue Sensipar. VDRA on hold D/T high corrected calcium ?6.    Nutrition - Has been advanced to full liquids. can advance to renal diet with fluid restriction when clinically stable. Albumin 1.9. Severe PCM. Consulted dietician. Be sure this is ordered for OP clinic so we can send Rx so insurance will pay.  ? ?Jimmye Norman. Juliet Vasbinder NP-C ?08/26/2021, 10:03 AM  ?[facility] ?[phone] ? ? ?  ? ?

## 2021-08-27 DIAGNOSIS — K56609 Unspecified intestinal obstruction, unspecified as to partial versus complete obstruction: Secondary | ICD-10-CM | POA: Diagnosis not present

## 2021-08-27 MED ORDER — PANTOPRAZOLE SODIUM 40 MG PO TBEC
40.0000 mg | DELAYED_RELEASE_TABLET | Freq: Every day | ORAL | Status: DC
  Administered 2021-08-28 – 2021-08-30 (×3): 40 mg via ORAL
  Filled 2021-08-27 (×3): qty 1

## 2021-08-27 NOTE — Progress Notes (Signed)
?[facility] ?Progress Note  ? ?Subjective: Was supposed to discharge but hasn't been cleared by surgery. On side of bed eating lunch-tolerating solid food without issues. Next HD 08/29/2021 on schedule.     ? ?Objective ?Vitals:  ? 08/26/21 1546 08/26/21 2103 08/27/21 0509 08/27/21 0829  ?BP: 107/71 (!) 112/98 (!) 95/55 107/70  ?Pulse: 99 85 70 73  ?Resp: 18 18 18 18   ?Temp: 97.7 ?F (36.5 ?C) 98.3 ?F (36.8 ?C) 98.1 ?F (36.7 ?C) 97.8 ?F (36.6 ?C)  ?TempSrc:  Oral Oral Oral  ?SpO2: 100% 100% 100% 98%  ?Weight:      ? ?Physical Exam ?General: Cachetic appearing male in NAD ?Heart: irreg, irreg. No M/R/G ?Lungs: Bilateral breath sounds decreased in bases today.  ?Abdomen: still distended, tender. ?Extremities: Trace pitting LE edema posterior calves, ankles ?Dialysis Access: R AVF + T/B ?  ? ?Additional Objective ?Labs: ?Basic Metabolic Panel: ?Recent Labs  ?Lab 08/23/21 ?0621 08/24/21 ?0328 08/25/21 ?0136 08/26/21 ?0215  ?NA 131* 133* 133* 133*  ?K 4.4 3.9 3.5 3.2*  ?CL 92* 96* 94* 99  ?CO2 29 27 26 25   ?GLUCOSE 85 87 87 90  ?BUN 25* 24* 26* 14  ?CREATININE 3.94* 4.35* 4.84* 2.86*  ?CALCIUM 8.4* 8.2* 8.3* 7.9*  ?PHOS 3.9  --   --   --   ? ?Liver Function Tests: ?Recent Labs  ?Lab 08/22/21 ?0945 08/23/21 ?0076  ?AST 38  --   ?ALT 22  --   ?ALKPHOS 173*  --   ?BILITOT 1.6*  --   ?PROT 7.7  --   ?ALBUMIN 2.4* 1.9*  ? ?Recent Labs  ?Lab 08/22/21 ?0945  ?LIPASE 29  ? ?CBC: ?Recent Labs  ?Lab 08/22/21 ?2263 08/23/21 ?3354 08/24/21 ?5625 08/25/21 ?0136 08/26/21 ?0215  ?WBC 3.6* 3.5* 3.9* 3.7* 2.2*  ?NEUTROABS 2.4  --  2.3 2.3 1.8  ?HGB 9.4* 7.6* 6.3* 7.0* 8.4*  ?HCT 26.9* 21.7* 17.2* 19.3* 23.6*  ?MCV 102.3* 100.0 97.7 100.5* 94.8  ?PLT 189 162 131* 130* 122*  ? ?Blood Culture ?   ?Component Value Date/Time  ? SDES ABSCESS 08/06/2021 1110  ? [place] NONE 08/06/2021 1110  ? CULT  08/06/2021 1110  ?  FEW ESCHERICHIA COLI ?ABUNDANT BACTEROIDES FRAGILIS ?BETA LACTAMASE POSITIVE ?Performed at [facility], [place] [address], [place], [place] [address] ?  ? REPTSTATUS 08/09/2021 FINAL 08/06/2021 1110  ? ? ?Cardiac Enzymes: ?No results for input(s): CKTOTAL, CKMB, CKMBINDEX, TROPONINI in the last 168 hours. ?CBG: ?No results for input(s): GLUCAP in the last 168 hours. ?Iron Studies: No results for input(s): IRON, TIBC, TRANSFERRIN, FERRITIN in the last 72 hours. ?@lablastinr3 @ ?Studies/Results: ?No results found. ?Medications: ? piperacillin-tazobactam (ZOSYN)  IV 2.25 g (08/27/21 0421)  ? ? (feeding supplement) PROSource Plus  30 mL Oral BID BM  ? sodium chloride   Intravenous Once  ? Chlorhexidine Gluconate Cloth  6 each Topical Q0600  ? cinacalcet  30 mg Oral Q T,Th,Sa-HD  ? docusate sodium  100 mg Oral BID  ? feeding supplement  1 Container Oral TID BM  ? feeding supplement (NEPRO CARB STEADY)  237 mL Oral BID BM  ? heparin  5,000 Units Subcutaneous Q8H  ? multivitamin  1 tablet Oral QHS  ? [START ON 08/28/2021] pantoprazole  40 mg Oral Daily  ? ? ? ?Dialysis Orders:  ?TTS - [place] ([place]) ? 4hrs, BFR 400, DFR 500,  EDW 52.5kg, 3K/ 2.5Ca ?No heparin bolus ?Mircera 225 mcg q2wks -  last 08/19/21 ?Sensipar 30mg  3x weekly ?  ?Assessment/Plan: ?Recurrent SBO-continue conservative management, on IV Protonix and IV Compazine. Surgery following.   ?Sickle Cell disease: HGB 8.4 08/26/2021. Transfused 2 units PRBCs t03/03/2022. Recent ESA as OP.  ?Combined systolic and diastolic HF: EF 92-95% 74/7340. LV global hypokinesis. Has been on Torsemide to help control volume between dialysis treatments. CXR done 03/09. Prominence of vessels and interstitial markings suggestive of CHF. Small pleural effusions. Extra HD 08/25/2021. Continue to optimize volume with HD.   ?Persistent AF/AFL-Not on anticoagulation D/T GIB.  ?2.   ESRD - on HD TTS.HD off schedule today.  Plan for short HD 08/26/2021 to resume T,Th,S and volume removal. Next HD 08/29/2021 on schedule.  ?3.   Hypertension/volume  - BP stable. CXR  08/24/2021 suggestive of CHF. Has been on IVF, now with BLE pitting edema. Left OP HD 08/22/2021 6.8 kg above OP EDW. HD 08/25/2021 with next UF 3.5 liters. Still 3.4 kgs above OP EDW. UF and lower volume as tolerated.  ?4.   Anemia of CKD -H/O SSD. See # 2 ?5.   Secondary Hyperparathyroidism -  Continue binders when able to eat. Continue Sensipar. VDRA on hold D/T high corrected calcium ?6.    Nutrition - Has been advanced to full liquids. can advance to renal diet with fluid restriction when clinically stable. Albumin 1.9. Severe PCM. Consulted dietician. Ordered Boost. Be sure this is ordered for OP clinic so we can send Rx so insurance will pay.  ? ?Disposition: Stable from nephrology stand point to discharge home today.  ? ?Jimmye Norman. Harlis Champoux NP-C ?08/27/2021, 12:45 PM  ?[facility] ?[phone] ? ? ?  ? ?

## 2021-08-27 NOTE — TOC Transition Note (Signed)
Transition of Care (TOC) - CM/SW Discharge Note ? ? ?Patient Details  ?Name: Patrick [name] ?MRN: [id number] ?Date of Birth: 08-20-1955 ? ?Transition of Care (TOC) CM/SW Contact:  ?Bartholomew Crews, RN ?Phone Number: [phone] ?08/27/2021, 8:31 AM ? ? ?Clinical Narrative:    ? ?Spoke with patient on hospital room phone to discuss post acute transition. Patient stated that he has transportation home if discharged today. No current home health services, but he is working with his insurance to get caregiver services. No further TOC needs identified at this time.  ? ?Final next level of care: Home/Self Care ?Barriers to Discharge: No Barriers Identified ? ? ?Patient Goals and CMS Choice ?Patient states their goals for this hospitalization and ongoing recovery are:: return home ?CMS Medicare.gov Compare Post Acute Care list provided to:: Patient ?Choice offered to / list presented to : NA ? ?Discharge Placement ?  ?           ?  ?  ?  ?  ? ?Discharge Plan and Services ?  ?  ?           ?DME Arranged: N/A ?DME Agency: NA ?  ?  ?  ?HH Arranged: NA ?[place] Agency: NA ?  ?  ?  ? ?Social Determinants of Health (SDOH) Interventions ?  ? ? ?Readmission Risk Interventions ?Readmission Risk Prevention Plan 08/05/2020  ?Transportation Screening Complete  ?PCP or Specialist Appt within 3-5 Days Complete  ?[place] or Home Care Consult Complete  ?Social Work Consult for [place] Planning/Counseling Complete  ?Palliative Care Screening Not Applicable  ?Medication Review [employer]) Complete  ?Some recent data might be hidden  ? ? ? ? ? ?

## 2021-08-27 NOTE — Progress Notes (Signed)
Patient ID: Patrick [name], male   DOB: 19-May-1956, 66 y.o.   MRN: [id number] ?[facility] Progress Note:   * No surgery found *  ?Subjective: ?Mental status is alert.  Complaints wants something to eat. ?Objective: ?Vital signs in last 24 hours: ?Temp:  [97.7 ?F (36.5 ?C)-99 ?F (37.2 ?C)] 97.8 ?F (36.6 ?C) (03/12 8280) ?Pulse Rate:  [70-99] 73 (03/12 0829) ?Resp:  [14-18] 18 (03/12 0829) ?BP: (85-112)/(50-98) 107/70 (03/12 0829) ?SpO2:  [98 %-100 %] 98 % (03/12 0829) ?Weight:  [54.7 kg-60 kg] 54.7 kg (03/11 1519) ? ?Intake/Output from previous day: ?03/11 0701 - 03/12 0700 ?In: 240 [P.O.:240] ?Out: 4000  ?Intake/Output this shift: ?No intake/output data recorded. ? ?Physical Exam: Work of breathing is normal.  VSS' ?Abdomen is nontender now but occasionally he complains of abdominal pain. ? ?Lab Results:  ?Results for orders placed or performed during the hospital encounter of 08/22/21 (from the past 48 hour(s))  ?CBC with Differential/Platelet     Status: Abnormal  ? Collection Time: 08/26/21  2:15 AM  ?Result Value Ref Range  ? WBC 2.2 (L) 4.0 - 10.5 K/uL  ?  Comment: WHITE COUNT CONFIRMED ON SMEAR  ? RBC 2.49 (L) 4.22 - 5.81 MIL/uL  ? Hemoglobin 8.4 (L) 13.0 - 17.0 g/dL  ? HCT 23.6 (L) 39.0 - 52.0 %  ? MCV 94.8 80.0 - 100.0 fL  ? MCH 33.7 26.0 - 34.0 pg  ? MCHC 35.6 30.0 - 36.0 g/dL  ? RDW 24.4 (H) 11.5 - 15.5 %  ? Platelets 122 (L) 150 - 400 K/uL  ? nRBC 17.7 (H) 0.0 - 0.2 %  ? Neutrophils Relative % 82 %  ? Neutro Abs 1.8 1.7 - 7.7 K/uL  ? Lymphocytes Relative 7 %  ? Lymphs Abs 0.2 (L) 0.7 - 4.0 K/uL  ? Monocytes Relative 9 %  ? Monocytes Absolute 0.2 0.1 - 1.0 K/uL  ? Eosinophils Relative 1 %  ? Eosinophils Absolute 0.0 0.0 - 0.5 K/uL  ? Basophils Relative 1 %  ? Basophils Absolute 0.0 0.0 - 0.1 K/uL  ? nRBC 49 (H) 0 /100 WBC  ? Abs Immature Granulocytes 0.00 0.00 - 0.07 K/uL  ? Acanthocytes PRESENT   ? [place] PRESENT   ? Polychromasia PRESENT   ? Target Cells PRESENT   ?   Comment: Performed at [facility], [place] [address], [place], [place] [address]  ?Basic metabolic panel     Status: Abnormal  ? Collection Time: 08/26/21  2:15 AM  ?Result Value Ref Range  ? Sodium 133 (L) 135 - 145 mmol/L  ? Potassium 3.2 (L) 3.5 - 5.1 mmol/L  ? Chloride 99 98 - 111 mmol/L  ? CO2 25 22 - 32 mmol/L  ? Glucose, Bld 90 70 - 99 mg/dL  ?  Comment: Glucose reference range applies only to samples taken after fasting for at least 8 hours.  ? BUN 14 8 - 23 mg/dL  ? Creatinine, Ser 2.86 (H) 0.61 - 1.24 mg/dL  ?  Comment: DELTA CHECK NOTED  ? Calcium 7.9 (L) 8.9 - 10.3 mg/dL  ? GFR, Estimated 24 (L) >60 mL/min  ?  Comment: (NOTE) ?Calculated using the CKD-EPI Creatinine Equation (2021) ?  ? Anion gap 9 5 - 15  ?  Comment: Performed at [facility], [place] [address] [place], [place] [address]  ? ? ?Radiology/Results: ?No results found. ? ?Anti-infectives: ?Anti-infectives (From admission, onward)  ? ? Start  Dose/Rate Route Frequency Ordered Stop  ? 08/22/21 1915  piperacillin-tazobactam (ZOSYN) IVPB 2.25 g       ? 2.25 g ?100 mL/hr over 30 Minutes Intravenous Every 8 hours 08/22/21 1902    ? ?  ? ? ?Assessment/Plan: ?Problem List: ?Patient Active Problem List  ? Diagnosis Date Noted  ? Hypothermia 08/22/2021  ? Abdominal fluid collection 08/22/2021  ? Gastric AVM   ? Hiatal hernia   ? Pressure injury of skin 08/01/2021  ? Hyponatremia 07/31/2021  ? Hypomagnesemia 07/31/2021  ? Upper GI bleeding 06/27/2021  ? H/O major abdominal surgery 06/27/2021  ? Visual impairment 06/27/2021  ? COPD not affecting current episode of care [facility]) 06/27/2021  ? Empyema of gallbladder 05/27/2021  ? Acute gangrenous cholecystitis s/p open cholcystectomy 05/24/2021 05/27/2021  ? 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 05/24/2021 05/27/2021  ? Acute blood loss anemia   ? Protein-calorie malnutrition, severe 05/23/2021  ? SBO (small bowel obstruction) ([place]) 05/19/2021  ? Acute upper GI bleed 08/02/2020  ?  Prolonged QT interval 08/02/2020  ? Upper GI bleed 08/02/2020  ? Hematemesis with nausea   ? Chronic anticoagulation   ? Tobacco dependence 05/28/2020  ? Muscle spasm of back 01/29/2020  ? Chronic systolic CHF (congestive heart failure) ([place]) 09/11/2019  ? Hypoxia 06/02/2018  ? Symptomatic anemia   ? Chronic heart failure with preserved ejection fraction ([place])   ? Colon cancer screening 07/16/2017  ? Encounter for therapeutic drug monitoring 12/29/2015  ? 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  ? Permanent atrial fibrillation ([place]) 12/24/2015  ? Cough with sputum 10/25/2015  ? Muscle tightness-Right arm  05/27/2014  ? Arm pain, right 05/06/2014  ? ESRD on HD TTS 05/06/2014  ? Chest pain 01/13/2014  ? Vitamin D deficiency 09/25/2013  ? CKD (chronic kidney disease), stage IV ([place]) 09/25/2013  ? Elevated uric acid in blood 09/25/2013  ? Hb-SS disease without crisis ([place]) 09/25/2013  ? Anemia 09/25/2013  ? Onychomycosis of toenail 08/27/2013  ? Hyperglycemia 08/27/2013  ? CHF (congestive heart failure) ([place]) 08/19/2013  ? Nausea & vomiting 02/07/2013  ? Sickle cell anemia ([place]) 11/21/2012  ? Abdominal pain 01/17/2012  ? NSVT (nonsustained ventricular tachycardia) 10/21/2011  ? Gout 10/19/2011  ? Sickle cell crisis ([place]) 10/17/2011  ? Chronic combined systolic and diastolic CHF (congestive heart failure) ([place]) 10/17/2011  ? Essential hypertension 06/07/2011  ? ? ?OK to advance diet and see how he tolerates it.   ?* No surgery found *  ? ? LOS: 5 days  ? ?Matt B. Hassell Done, MD, FACS ? ?[facility] ?[phone] to reach the surgeon on call.   ? ?08/27/2021 8:35 AM  ?

## 2021-08-27 NOTE — Progress Notes (Signed)
?PROGRESS NOTE ? ?Patrick [name]  ?DOB: 09/06/1955  ?PCP: [name], MD ?SJG:[id number]  ?DOA: 08/22/2021 ? LOS: 5 days  ?Hospital Day: 6 ? ?Brief narrative: ?Patrick [name] is a 66 y.o. male with PMH significant for ESRD HD TTS, CHF, sickle cell disease, permanent A-fib, upper GI bleed due to AVMs, laparotomy/cholecystectomy/ischemic small bowel resection in 12/22 and recent hospitalization from SBO that resolved with NGT and pelvic abscess that was treated with drain placement and antibiotics. ? ?During hospitalization in December 2022, he had laparoscopy with subsequent laparotomy.  ?He was subsequently admitted twice after that.  Last hospitalized in 2/13 to 2/24 for bowel obstruction which improved with conservative measures.   ? ?Patient presented to the ED on 3/7 with complaint of severe abdominal pain for a day. ?CT scan showed diffuse small bowel dilatation suggestive of high-grade small bowel obstruction.  Surgical anastomosis in the right lower quadrant of the abdomen with continued stool like material and a transition point distal to the anastomosis.  It also showed a fluid collection in the anterior lower abdominal pelvis, measuring 4.5 cm. ? ?He was admitted to hospitalist service. ?General surgery consultation was obtained. ? ?Subjective: ?Seen and examined this morning.   ?Feels better.   ?Able to tolerate liquid diet.  Advanced to soft this morning.  General surgery follow-up appreciated. ? ?Principal Problem: ?  SBO (small bowel obstruction) ([place]) ?Active Problems: ?  Chronic combined systolic and diastolic CHF (congestive heart failure) ([place]) ?  ESRD on HD TTS ?  Prolonged QT interval ?  Sickle cell anemia (HCC) ?  Permanent atrial fibrillation ([place]) ?  Hypothermia ?  Abdominal fluid collection ?  Anemia ?  Protein-calorie malnutrition, severe ?  Visual impairment ?  COPD not affecting current episode of care [facility]) ?  ? ?Hospital course: ?3 months s/p laparotomy with  cholecystectomy ?SBO versus ileus ?-Presented with abdominal pain, nausea, vomiting. ?-CT abdomen finding as above showing SBO with transition point in RLQ. ?-Started on conservative management with bowel rest, NG decompression, IV fluid, pain management, IV Protonix, IV Compazine. ?-Clinically improved.  NG tube removed.  Had a large bowel movement yesterday.  Able to tolerate full liquid diet.  Diet advanced to soft this morning. ? ?Recurrent intra-abdominal collection s/p previous drain ?-4.5 cm collection in the anterior lower abdomen/pelvis.  IR was consulted.  Per IR note, fluid collection is small in size for drainage.  Continue to monitor with antibiotics. ?-Currently on IV Zosyn.   ? ?Acute anemia ?Sickle cell disease ?History of GI bleed from AVM treated with APC on 07/2021 ?-Not in sickle cell crisis clinically but hemoglobin fell down to a low of 6.3 on 3/9. ?-2 units of PRBC transfusion given with dialysis on 3/10.  Hemoglobin better at 8.4 today. ?-Resume folic acid and hydroxyurea. ?Recent Labs  ?  05/31/21 ?0955 05/31/21 ?1518 08/22/21 ?6546 08/23/21 ?5035 08/24/21 ?4656 08/25/21 ?0136 08/26/21 ?0215  ?HGB  --    < > 9.4* 7.6* 6.3* 7.0* 8.4*  ?MCV  --    < > 102.3* 100.0 97.7 100.5* 94.8  ?FERRITIN 250  --   --   --   --   --   --   ?TIBC 232*  --   --   --   --   --   --   ?IRON 80  --   --   --   --   --   --   ? < > = values in  this interval not displayed.  ? ?ESRD HD TTS ?-Euvolemic at this time.  Last dialysis yesterday. TTS next 3/14.   ? ?Hyponatremia ?-Sodium level between 130 and 135.  Probably due to dialysis status, NG tube, poor oral intake.  Continue to monitor ?Recent Labs  ?Lab 08/22/21 ?3151 08/23/21 ?7616 08/24/21 ?0737 08/25/21 ?0136 08/26/21 ?0215  ?NA 132* 131* 133* 133* 133*  ? ?Chronic, systolic and diastolic CHF ?-Currently remains compensated.  Chronic mild bilateral lower extremity edema.   ?-Patient noncompliant to beta-blocker.  Blood pressure running in low normal range.   So I would not commit him on beta-blocker yet. ? ?Prolonged QT interval ?-QTc 551 ms on EKG on admission.  Minimize or avoid QT prolonging drugs ?-Repeat EKG ? ?Permanent atrial fibrillation ?-On low-dose Coreg at home.  Currently NPO.  Continue IV metoprolol 2.5 mg as needed. ?-not on anticoagulation due to GI bleed/AVM ? ?COPD ?-Respiratory status stable.   ? ?Visual impairment ?-Reorientation and delirium precaution ? ?Protein-calorie malnutrition, severe ?-Very frail with significant muscle mass and subcu fat loss.  ?-Nutrition consulted ? ?Goals of care ?  Code Status: DNR  ? ? ?Mobility: Encourage ambulation ? ?Nutritional status:  ?Body mass index is 16.82 kg/m?Marland Kitchen  ?Nutrition Problem: Severe Malnutrition ?Etiology: chronic illness (ESRD on HD) ?Signs/Symptoms: severe fat depletion, severe muscle depletion, percent weight loss (12% within the last two months) ?Percent weight loss: 12 % ? ? ? ? ?Diet:  ?Diet Order   ? ?       ?  DIET SOFT Room service appropriate? Yes; Fluid consistency: Thin  Diet effective now       ?  ? ?  ?  ? ?  ? ? ?DVT prophylaxis:  ?heparin injection 5,000 Units Start: 08/22/21 2200 ?  ?Antimicrobials: Zosyn IV ?Fluid: Not on IV fluid ?Consultants: Nephrology, IR, surgery ?Family Communication: None at bedside ? ?Status is: Inpatient ? ?Continue in-hospital care because: Gradually improving small bowel obstruction ?Level of care: Med-Surg  ? ?Dispo: The patient is from: Home ?             Anticipated d/c is to: Hopefully home tomorrow ?             Patient currently is not medically stable to d/c. ?Difficult to place patient No ? ? ? ? ?Infusions:  ? piperacillin-tazobactam (ZOSYN)  IV 2.25 g (08/27/21 0421)  ? ? ?Scheduled Meds: ? (feeding supplement) PROSource Plus  30 mL Oral BID BM  ? sodium chloride   Intravenous Once  ? Chlorhexidine Gluconate Cloth  6 each Topical Q0600  ? cinacalcet  30 mg Oral Q T,Th,Sa-HD  ? docusate sodium  100 mg Oral BID  ? feeding supplement  1 Container  Oral TID BM  ? feeding supplement (NEPRO CARB STEADY)  237 mL Oral BID BM  ? heparin  5,000 Units Subcutaneous Q8H  ? multivitamin  1 tablet Oral QHS  ? [START ON 08/28/2021] pantoprazole  40 mg Oral Daily  ? ? ?PRN meds: ?HYDROmorphone (DILAUDID) injection, metoprolol tartrate, prochlorperazine  ? ?Antimicrobials: ?Anti-infectives (From admission, onward)  ? ? Start     Dose/Rate Route Frequency Ordered Stop  ? 08/22/21 1915  piperacillin-tazobactam (ZOSYN) IVPB 2.25 g       ? 2.25 g ?100 mL/hr over 30 Minutes Intravenous Every 8 hours 08/22/21 1902    ? ?  ? ? ?Objective: ?Vitals:  ? 08/27/21 0509 08/27/21 0829  ?BP: (!) 95/55 107/70  ?Pulse: 70 73  ?  Resp: 18 18  ?Temp: 98.1 ?F (36.7 ?C) 97.8 ?F (36.6 ?C)  ?SpO2: 100% 98%  ? ? ?Intake/Output Summary (Last 24 hours) at 08/27/2021 1128 ?Last data filed at 08/27/2021 3154 ?Gross per 24 hour  ?Intake 220 ml  ?Output 4000 ml  ?Net -3780 ml  ? ?Filed Weights  ? 08/26/21 1200 08/26/21 1235 08/26/21 1519  ?Weight: 60 kg 60 kg 54.7 kg  ? ?Weight change: 0.3 kg ?Body mass index is 16.82 kg/m?.  ? ?Physical Exam: ?General exam: Pleasant, middle-aged African-American male.  Pain controlled.  ?skin: No rashes, lesions or ulcers. ?HEENT: Atraumatic, normocephalic, no obvious bleeding ?Lungs: Clear to auscultation bilaterally ?CVS: Regular rate and rhythm, no murmur ?GI/Abd soft, improved abdominal distention and tenderness.  Bowel sound present. ?CNS: Alert, awake, oriented x3 ?Psychiatry: Sad affect ?Extremities: Mild bilateral pedal edema ? ?Data Review: I have personally reviewed the laboratory data and studies available. ? ?F/u labs ordered ?Unresulted Labs (From admission, onward)  ? ?  Start     Ordered  ? 08/24/21 0500  CBC with Differential/Platelet  Daily,   R     ?Question:  Specimen collection method  Answer:  Lab=Lab collect  ? 08/23/21 1528  ? Signed and Held  Renal function panel  Once,   R       ?Question:  Specimen collection method  Answer:  Lab=Lab collect  ?  Signed and Held  ? ?  ?  ? ?  ? ? ?Signed, ?Terrilee Croak, MD ?[facility] ?08/27/2021 ? ? ? ? ? ? ? ? ? ? ?  ?

## 2021-08-28 ENCOUNTER — Other Ambulatory Visit: Payer: Self-pay | Admitting: Family Medicine

## 2021-08-28 ENCOUNTER — Telehealth: Payer: Self-pay

## 2021-08-28 DIAGNOSIS — D571 Sickle-cell disease without crisis: Secondary | ICD-10-CM

## 2021-08-28 DIAGNOSIS — K56609 Unspecified intestinal obstruction, unspecified as to partial versus complete obstruction: Secondary | ICD-10-CM | POA: Diagnosis not present

## 2021-08-28 DIAGNOSIS — G894 Chronic pain syndrome: Secondary | ICD-10-CM

## 2021-08-28 LAB — BASIC METABOLIC PANEL
Anion gap: 8 (ref 5–15)
BUN: 17 mg/dL (ref 8–23)
CO2: 27 mmol/L (ref 22–32)
Calcium: 7.8 mg/dL — ABNORMAL LOW (ref 8.9–10.3)
Chloride: 100 mmol/L (ref 98–111)
Creatinine, Ser: 3.83 mg/dL — ABNORMAL HIGH (ref 0.61–1.24)
GFR, Estimated: 17 mL/min — ABNORMAL LOW (ref >60)
Glucose, Bld: 94 mg/dL (ref 70–99)
Potassium: 3.1 mmol/L — ABNORMAL LOW (ref 3.5–5.1)
Sodium: 135 mmol/L (ref 135–145)

## 2021-08-28 LAB — CBC WITH DIFFERENTIAL/PLATELET
Abs Immature Granulocytes: 0.02 10*3/uL (ref 0.00–0.07)
Basophils Absolute: 0 10*3/uL (ref 0.0–0.1)
Basophils Relative: 1 %
Eosinophils Absolute: 0.1 10*3/uL (ref 0.0–0.5)
Eosinophils Relative: 3 %
HCT: 23.4 % — ABNORMAL LOW (ref 39.0–52.0)
Hemoglobin: 7.9 g/dL — ABNORMAL LOW (ref 13.0–17.0)
Immature Granulocytes: 1 %
Lymphocytes Relative: 14 %
Lymphs Abs: 0.6 10*3/uL — ABNORMAL LOW (ref 0.7–4.0)
MCH: 32.8 pg (ref 26.0–34.0)
MCHC: 33.8 g/dL (ref 30.0–36.0)
MCV: 97.1 fL (ref 80.0–100.0)
Monocytes Absolute: 1.2 10*3/uL — ABNORMAL HIGH (ref 0.1–1.0)
Monocytes Relative: 31 %
Neutro Abs: 2 10*3/uL (ref 1.7–7.7)
Neutrophils Relative %: 50 %
Platelets: 116 10*3/uL — ABNORMAL LOW (ref 150–400)
RBC: 2.41 MIL/uL — ABNORMAL LOW (ref 4.22–5.81)
RDW: 23.2 % — ABNORMAL HIGH (ref 11.5–15.5)
WBC: 3.9 10*3/uL — ABNORMAL LOW (ref 4.0–10.5)
nRBC: 1.8 % — ABNORMAL HIGH (ref 0.0–0.2)

## 2021-08-28 MED ORDER — ACETAMINOPHEN 325 MG PO TABS
650.0000 mg | ORAL_TABLET | Freq: Four times a day (QID) | ORAL | Status: DC
  Administered 2021-08-28 – 2021-08-30 (×8): 650 mg via ORAL
  Filled 2021-08-28 (×8): qty 2

## 2021-08-28 MED ORDER — OXYCODONE HCL 5 MG PO CAPS
10.0000 mg | ORAL_CAPSULE | Freq: Four times a day (QID) | ORAL | 0 refills | Status: DC | PRN
  Filled 2021-08-28: qty 60, 8d supply, fill #0

## 2021-08-28 MED ORDER — OXYCODONE HCL 5 MG PO TABS
5.0000 mg | ORAL_TABLET | ORAL | Status: DC | PRN
  Administered 2021-08-28 – 2021-08-30 (×6): 10 mg via ORAL
  Filled 2021-08-28 (×6): qty 2

## 2021-08-28 NOTE — Progress Notes (Addendum)
Nutrition Follow-up ? ?DOCUMENTATION CODES:  ? ?Severe malnutrition in context of chronic illness, Underweight ? ?INTERVENTION:  ?Continue renavit daily  ?Continue Boost Breeze po TID, each supplement provides 250 kcal and 9 grams of protein ?Continue Prosource Plus ?Discontinue Nepro Shake po BID d/t pt refusal  ?Double protein portions with meals  ?Encourage adequate PO intake  ? ?NUTRITION DIAGNOSIS:  ? ?Severe Malnutrition related to chronic illness (ESRD on HD) as evidenced by severe fat depletion, severe muscle depletion, percent weight loss (12% within the last two months). ? ?Ongoing  ? ?GOAL:  ? ?Patient will meet greater than or equal to 90% of their needs ? ?Progressing; addressing via PO intake and oral nutrition supplements  ? ?MONITOR:  ? ?PO intake, Supplement acceptance, Labs, I & O's, Weight trends ? ?REASON FOR ASSESSMENT:  ? ?Consult ?Assessment of nutrition requirement/status ? ?ASSESSMENT:  ? ?Pt is a 66 year old made with PMH significant of ESRD on HD, sickle cell disease, legally blind, A-fib, upper GI bleed due to AVMs, CHF, recent GI bleed and small bowel obstruction s/p bowel resection and open cholecystectomy and recent hospitalization from SBO resolved with NGT and pelvic abscess treated with drain placement and antibiotics who presented to the ED with progressive abdominal pain for one day and admitted for SBO. ? ?3/07 - NPO  ?3/10 - diet advanced to clear liquid and then to full liquid  ?3/12 - diet advanced to soft diet  ? ?Per chart, meal completions of 100% for breakfast on 3/11 and breakfast on 3/12.  ? ?Pt's last HD was on 3/11 with net UF 4000 mL removed. Pt to have next HD tomorrow on 3/14.  ? ?Pt sitting up in recliner chart during visit and had just finished walking with mobility. Per pt, pt endorses a good appetite today and good PO intake. Pt reports that he ate 100% of his breakfast meal this morning which consisted of scrambled eggs with cheese, french toast, sausage,  banana and a cup of coffee. Pt reports that he tolerated this meal well but still complains of mild to moderate abdominal pain after eating. Pt reports that he ate 100% of his dinner meal last night which consisted of meat loaf with mashed potatoes, carrots, and a dinner roll and tolerated well but complains that he had a coughing episode after eating yesterday. Pt denies any chewing or swallowing difficulties associated with this coughing but reports that he was unusual because he was not coughing prior to eating. Per pt, he reports that he has been having a really good appetite and is trying to eat consistently to gain weight back. Pt denies any constipation and reports last BM was this morning. Per pt, pt has still been tolerating the Boost Breeze well but reports that he refused it this AM because they brought it during the same time as his meal and he was already full from meal. Pt unsure if he has been receiving Prosource or the Nepro supplement, but is certain that he has been getting the [employer].  ? ?Discussed with pt the importance of adequate PO intake for healing and to meet nutritional needs. Encouraged pt to continue to eat consistent meals throughout the day and intake of oral nutrition supplements to aid in caloric and protein intake. Dietetic intern to reach out to RN to make sure pt is receiving oral nutrition supplements between meals and not during to promote supplement intake. Discussed with pt double protein portions with meals due to 100%  of meal completions and pt agreeable to this plan. Dietetic intern to discontinue Nepro.  ? ?Current wt: 54.7 kg  ?Admit wt: 58.1 kg  ?EDW: 52.5 kg  ? ?Labs reviewed and include: Sodium: 133, Potassium: 3.2, Corrected calcium: 9.5, Hemoglobin: 8.4  ? ?Medications reviewed and include:  ? PROSource Plus  30 mL Oral BID BM  ? cinacalcet  30 mg Oral Q T,Th,Sa-HD  ? docusate sodium  100 mg Oral BID  ? Boost Breeze   1 Container Oral TID BM  ? NEPRO CARB  STEADY  237 mL Oral BID BM  ? multivitamin  1 tablet Oral QHS  ? pantoprazole  40 mg Oral Daily  ? ?Continuous Infusions: ? piperacillin-tazobactam (ZOSYN)  IV 2.25 g (08/28/21 0326)  ? ? ?Diet Order:   ?Diet Order   ? ?       ?  DIET SOFT Room service appropriate? Yes; Fluid consistency: Thin  Diet effective now       ?  ? ?  ?  ? ?  ? ? ?EDUCATION NEEDS:  ? ?Education needs have been addressed ? ?Skin:  Skin Assessment: Skin Integrity Issues: ?Skin Integrity Issues:: Stage II, Incisions ?Stage II: mid sacrum,left sacrum ?Incisions: abdomen ? ?Last BM:  3/13; type 6 ? ?Height:  ? ?Ht Readings from Last 1 Encounters:  ?08/01/21 5\' 11"  (1.803 m)  ? ? ?Weight:  ? ?Wt Readings from Last 1 Encounters:  ?08/26/21 54.7 kg  ? ? ?BMI:  Body mass index is 16.82 kg/m?. ? ?Estimated Nutritional Needs:  ? ?Kcal:  1900 - 2100 ? ?Protein:  100 - 115 grams ? ?Fluid:  1000 mL + UOP ? ? ? ?Maryruth Hancock, Dietetic Intern ?08/28/2021 12:39 PM ?

## 2021-08-28 NOTE — Progress Notes (Signed)
? ?Progress Note ? ?   ?Subjective: ?Tolerating soft diet without nausea or emesis but says he has pain up to 7/10 after eating requiring pain medication. Milder pain at rest. Finished his breakfast this am. Passing flatus and has had BMs ? ?Objective: ?Vital signs in last 24 hours: ?Temp:  [97.5 ?F (36.4 ?C)-98.2 ?F (36.8 ?C)] 97.5 ?F (36.4 ?C) (03/13 0810) ?Pulse Rate:  [66-90] 74 (03/13 0810) ?Resp:  [16-18] 18 (03/13 0810) ?BP: (102-113)/(61-93) 105/68 (03/13 0810) ?SpO2:  [98 %-100 %] 100 % (03/13 0810) ?Last BM Date : 08/27/21 ? ?Intake/Output from previous day: ?03/12 0701 - 03/13 0700 ?In: 560 [P.O.:460; IV Piggyback:100] ?Out: -  ?Intake/Output this shift: ?No intake/output data recorded. ? ?PE: ?General: pleasant, WD, male who is laying in bed in NAD ?HEENT: head is normocephalic, atraumatic.  Mouth is pink and moist ?Heart: regular, rate, and rhythm.  Palpable radial pulses bilaterally ?Lungs: Respiratory effort nonlabored on room air ?Abd: soft, ND, +BS, mild TTP over incision and across lower abdomen. No rebound or guarding. Mepilex over midline incision - incision very shallow with eschar - no erythema, induration, discharge ?MSK: all 4 extremities are symmetrical with no cyanosis, clubbing, or edema. ?Skin: warm and dry ?Psych: A&Ox3 with an appropriate affect.  ? ? ?Lab Results:  ?Recent Labs  ?  08/26/21 ?0215  ?WBC 2.2*  ?HGB 8.4*  ?HCT 23.6*  ?PLT 122*  ? ?BMET ?Recent Labs  ?  08/26/21 ?0215  ?NA 133*  ?K 3.2*  ?CL 99  ?CO2 25  ?GLUCOSE 90  ?BUN 14  ?CREATININE 2.86*  ?CALCIUM 7.9*  ? ?PT/INR ?No results for input(s): LABPROT, INR in the last 72 hours. ?CMP  ?   ?Component Value Date/Time  ? NA 133 (L) 08/26/2021 0215  ? NA 135 05/24/2020 1119  ? K 3.2 (L) 08/26/2021 0215  ? CL 99 08/26/2021 0215  ? CO2 25 08/26/2021 0215  ? GLUCOSE 90 08/26/2021 0215  ? BUN 14 08/26/2021 0215  ? BUN 25 05/24/2020 1119  ? CREATININE 2.86 (H) 08/26/2021 0215  ? CREATININE 2.90 (H) 08/11/2015 1359  ? CALCIUM 7.9  (L) 08/26/2021 0215  ? PROT 7.7 08/22/2021 0945  ? PROT 7.5 05/24/2020 1119  ? ALBUMIN 1.9 (L) 08/23/2021 8182  ? ALBUMIN 4.1 05/24/2020 1119  ? AST 38 08/22/2021 0945  ? ALT 22 08/22/2021 0945  ? ALKPHOS 173 (H) 08/22/2021 0945  ? BILITOT 1.6 (H) 08/22/2021 0945  ? BILITOT 2.4 (H) 05/24/2020 1119  ? GFRNONAA 24 (L) 08/26/2021 0215  ? GFRNONAA 23 (L) 08/11/2015 1359  ? GFRAA 24 (L) 05/24/2020 1119  ? GFRAA 27 (L) 08/11/2015 1359  ? ?Lipase  ?   ?Component Value Date/Time  ? LIPASE 29 08/22/2021 0945  ? ? ? ? ? ?Studies/Results: ?No results found. ? ?Anti-infectives: ?Anti-infectives (From admission, onward)  ? ? Start     Dose/Rate Route Frequency Ordered Stop  ? 08/22/21 1915  piperacillin-tazobactam (ZOSYN) IVPB 2.25 g       ? 2.25 g ?100 mL/hr over 30 Minutes Intravenous Every 8 hours 08/22/21 1902    ? ?  ? ? ? ?Assessment/Plan ? 3 months s/p ex lap with cholecystectomy, SBR ?SBO vs ileus  ?Recurrent Intra-abdominal fluid collection s/p previous drain  ?- CT 3/7 with sbo and possible transition point distal to small bowel anastomosis in RLQ and residual fluid collection in anterior lower abdomen/pelvis measuring 4.5 cm ?- SBO protocol completed. Last abd xray 3/10 with distension but  contrast throughout colon and rectum ?- patient with return of bowel function  ?- IR does not feel collection is currently large enough for draining, consider repeat imaging in a few days if not improving  ?- afebrile - last WBC 3/11 2.2 ?- goal K >4.0 and Mg >2.0 to optimize bowel function  ?- mobilize as tolerated ?- tolerating soft diet without N/V - still having some abdominal pain ?- add PO pain meds ?  ?FEN - soft, IVF @50  cc/h, supplements per dietician  ?VTE - SQH ?ID - zosyn 3/7>> ?  ?Recent UGI bleed ?ESRD on HD ?CHF ?Severe MR, TR ?A fib  ?HTN ?Sickle Cell disease ?Anemia ? ?I reviewed Consultant Nephrology notes, hospitalist notes, last 24 h vitals and pain scores, last 48 h intake and output, and last 24 h labs and  trends. ? ? ? LOS: 6 days  ? ?Winferd Humphrey, PA-C ?[facility] ?08/28/2021, 8:24 AM ?Please see Amion for pager number during day hours 7:00am-4:30pm ? ?

## 2021-08-28 NOTE — Telephone Encounter (Signed)
Oxycodone  °

## 2021-08-28 NOTE — Progress Notes (Signed)
Subjective: No complaints, tolerating solid breakfast, next HD is tomorrow, on schedule ? ?Objective ?Vital signs in last 24 hours: ?Vitals:  ? 08/27/21 0829 08/27/21 1523 08/27/21 2128 08/28/21 0326  ?BP: 107/70 102/67 (!) 113/93 105/61  ?Pulse: 73 68 90 66  ?Resp: 18 18 16 16   ?Temp: 97.8 ?F (36.6 ?C) (!) 97.5 ?F (36.4 ?C) 97.8 ?F (36.6 ?C) 98.2 ?F (36.8 ?C)  ?TempSrc: Oral  Oral Oral  ?SpO2: 98% 100% 100% 100%  ?Weight:      ? ?Weight change:  ? ?Physical Exam: ?General: Thin alert adult male NAD ?Heart: Occasional regular beat, no MRG rate stable ?Lungs: CTA nonlabored breathing ?Abdomen: NABS, minimal distention nontender ?Extremities: Trace bilateral edema at ankles, ?Dialysis Access: R arm AVF bruit + ? ?Dialysis Orders:  ?TTS - [facility] ([place]) ? 4hrs, BFR 400, DFR 500,  EDW 52.5kg, 3K/ 2.5Ca ?No heparin bolus ?Mircera 225 mcg q2wks - last 08/19/21 ?Sensipar 30mg  3x weekly ?  ?Problem/Plan: ?Recurrent SBO-now tolerating solids plan per surgery management, on IV Zosyn  ?ESRD - on HD TTS.HD off schedule today.  Plan for short HD 08/26/2021 to resume T,Th,S and volume removal. Next HD 08/29/2021 on schedule.  ?Hypertension/volume  - BP stable. CXR 08/24/2021 suggestive of CHF. Has been on IVF, now with BLE trace edema. Left OP HD 08/22/2021 6.8 kg above OP EDW. HD 08/25/2021 with next UF 3.5 liters.  Using bed weights in hospital not accurate minimal pedal edema next HD tomorrow no change in OP EDW ?Sickle Cell disease: HGB 8.4 08/26/2021. Transfused 2 units PRBCs t03/03/2022. Recent ESA as OP.  ?Combined systolic and diastolic HF: EF 76-28% 31/5176. LV global hypokinesis. Has been on Torsemide to help control volume between dialysis treatments. Continue to optimize volume with HD.   ?Persistent AF/AFL-Not on anticoagulation D/T GIB.  ?  Anemia of CKD -H/O SSD. See # 4 ?  Secondary Hyperparathyroidism -  Continue binders when able to eat. Continue Sensipar. VDRA on hold D/T high corrected  calcium ?6.    Nutrition -  Albumin 1.9. Severe PCM. Consulted dietician. Ordered Boost. Be sure this is ordered for OP clinic so we can send Rx so insurance will pay.  ?  ?Okay for discharge per nephrology standpoint ? ?Ernest Haber, PA-C ?[facility] ?[place]eeper [phone] ?08/28/2021,7:59 AM ? LOS: 6 days  ? ?Labs: ?Basic Metabolic Panel: ?Recent Labs  ?Lab 08/23/21 ?0621 08/24/21 ?0328 08/25/21 ?0136 08/26/21 ?0215  ?NA 131* 133* 133* 133*  ?K 4.4 3.9 3.5 3.2*  ?CL 92* 96* 94* 99  ?CO2 29 27 26 25   ?GLUCOSE 85 87 87 90  ?BUN 25* 24* 26* 14  ?CREATININE 3.94* 4.35* 4.84* 2.86*  ?CALCIUM 8.4* 8.2* 8.3* 7.9*  ?PHOS 3.9  --   --   --   ? ?Liver Function Tests: ?Recent Labs  ?Lab 08/22/21 ?0945 08/23/21 ?0626  ?AST 38  --   ?ALT 22  --   ?ALKPHOS 173*  --   ?BILITOT 1.6*  --   ?PROT 7.7  --   ?ALBUMIN 2.4* 1.9*  ? ?Recent Labs  ?Lab 08/22/21 ?0945  ?LIPASE 29  ? ?No results for input(s): AMMONIA in the last 168 hours. ?CBC: ?Recent Labs  ?Lab 08/22/21 ?9485 08/23/21 ?4627 08/24/21 ?0350 08/25/21 ?0136 08/26/21 ?0215  ?WBC 3.6* 3.5* 3.9* 3.7* 2.2*  ?NEUTROABS 2.4  --  2.3 2.3 1.8  ?HGB 9.4* 7.6* 6.3* 7.0* 8.4*  ?HCT 26.9* 21.7* 17.2* 19.3* 23.6*  ?MCV 102.3* 100.0  97.7 100.5* 94.8  ?PLT 189 162 131* 130* 122*  ? ?Cardiac Enzymes: ?No results for input(s): CKTOTAL, CKMB, CKMBINDEX, TROPONINI in the last 168 hours. ?CBG: ?No results for input(s): GLUCAP in the last 168 hours. ? ?Studies/Results: ?No results found. ?Medications: ? piperacillin-tazobactam (ZOSYN)  IV 2.25 g (08/28/21 0326)  ? ? (feeding supplement) PROSource Plus  30 mL Oral BID BM  ? sodium chloride   Intravenous Once  ? Chlorhexidine Gluconate Cloth  6 each Topical Q0600  ? cinacalcet  30 mg Oral Q T,Th,Sa-HD  ? docusate sodium  100 mg Oral BID  ? feeding supplement  1 Container Oral TID BM  ? feeding supplement (NEPRO CARB STEADY)  237 mL Oral BID BM  ? heparin  5,000 Units Subcutaneous Q8H  ? multivitamin  1 tablet Oral QHS  ? pantoprazole   40 mg Oral Daily  ? ? ? ? ?

## 2021-08-28 NOTE — Progress Notes (Signed)
Reviewed PDMP substance reporting system prior to prescribing opiate medications. No inconsistencies noted.  ?Meds ordered this encounter  ?Medications  ? oxycodone (OXY-IR) 5 MG capsule  ?  Sig: Take 2 capsules (10 mg total) by mouth every 6 (six) hours as needed for pain.  ?  Dispense:  60 capsule  ?  Refill:  0  ?  Order Specific Question:   Supervising Provider  ?  AnswerTresa Garter [[id number]]  ? ?[name]  APRN, MSN, FNP-C ?[facility] [place] ?[facility] ?[address]  ?[place], [place] [address] ?[phone]  ?

## 2021-08-28 NOTE — Progress Notes (Signed)
Mobility Specialist: Progress Note ? ? 08/28/21 1128  ?Mobility  ?Activity Ambulated independently in hallway  ?Level of Assistance Standby assist, set-up cues, supervision of patient - no hands on  ?Assistive Device None  ?Distance Ambulated (ft) 440 ft  ?Activity Response Tolerated well  ?$Mobility charge 1 Mobility  ? ?Pt received in chair and agreeable to mobility. C/o abdominal pain they rated 5/10 during mobility, otherwise asymptomatic. Pt to chair after session with all needs met.  ? ?Harrell Gave Moxie Kalil ?Mobility Specialist ?Mobility Specialist [place]: [phone] ?Mobility Specialist [place]: [phone] ? ?

## 2021-08-28 NOTE — Progress Notes (Signed)
PROGRESS NOTE  Patrick [name]  DOB: Apr 01, 1956  PCP: [name], MD TIW:[id number]  DOA: 08/22/2021  LOS: 6 days  Hospital Day: 7  Brief narrative: Patrick [name] is a 66 y.o. male with PMH significant for ESRD HD TTS, CHF, sickle cell disease, permanent A-fib, upper GI bleed due to AVMs, laparotomy/cholecystectomy/ischemic small bowel resection in 12/22 and recent hospitalization from SBO that resolved with NGT and pelvic abscess that was treated with drain placement and antibiotics.  During hospitalization in December 2022, he had laparoscopy with subsequent laparotomy.  He was subsequently admitted twice after that.  Last hospitalized in 2/13 to 2/24 for bowel obstruction which improved with conservative measures.    Patient presented to the ED on 3/7 with complaint of severe abdominal pain for a day. CT scan showed diffuse small bowel dilatation suggestive of high-grade small bowel obstruction.  Surgical anastomosis in the right lower quadrant of the abdomen with continued stool like material and a transition point distal to the anastomosis.  It also showed a fluid collection in the anterior lower abdominal pelvis, measuring 4.5 cm.  He was admitted to hospitalist service. General surgery consultation was obtained.  Subjective: Seen and examined this morning.   Feels better.  Able to tolerate soft diet.  General surgery follow-up appreciated.  Principal Problem:   SBO (small bowel obstruction) (HCC) Active Problems:   Chronic combined systolic and diastolic CHF (congestive heart failure) (HCC)   ESRD on HD TTS   Prolonged QT interval   Sickle cell anemia (HCC)   Permanent atrial fibrillation (HCC)   Hypothermia   Abdominal fluid collection   Anemia   Protein-calorie malnutrition, severe   Visual impairment   COPD not affecting current episode of care [facility])    Hospital course: 3 months s/p laparotomy with cholecystectomy SBO versus ileus -Presented  with abdominal pain, nausea, vomiting. -CT abdomen finding as above showing SBO with transition point in RLQ. -Successfully improved with conservative management with bowel rest, NG decompression, IV fluid, pain management, IV Protonix, IV Compazine. -NG tube removed.  Having bowel movements.  Able to tolerate soft food.  Surgery following.  Recurrent intra-abdominal collection s/p previous drain -4.5 cm collection in the anterior lower abdomen/pelvis.  IR was consulted.  Per IR note, fluid collection is small in size for drainage.  Continue to monitor with antibiotics. -Currently on IV Zosyn.    Acute anemia Sickle cell disease History of GI bleed from AVM treated with APC on 07/2021 -Not in sickle cell crisis clinically but hemoglobin fell down to a low of 6.3 on 3/9. -2 units of PRBC transfusion given with dialysis on 3/10.  Hemoglobin better at 8.4 on last check on 3/11.  Repeat labs this afternoon.. -Resume folic acid and hydroxyurea. Recent Labs    05/31/21 0955 05/31/21 1518 08/22/21 0945 08/23/21 0621 08/24/21 0651 08/25/21 0136 08/26/21 0215  HGB  --    < > 9.4* 7.6* 6.3* 7.0* 8.4*  MCV  --    < > 102.3* 100.0 97.7 100.5* 94.8  FERRITIN 250  --   --   --   --   --   --   TIBC 232*  --   --   --   --   --   --   IRON 80  --   --   --   --   --   --    < > = values in this interval not displayed.   ESRD HD  TTS -Euvolemic at this time.  Last dialysis yesterday. TTS next 3/14.    Hyponatremia -Sodium level between 130 and 135.  Probably due to dialysis status, NG tube, poor oral intake.  Continue to monitor Recent Labs  Lab 08/22/21 0945 08/23/21 0621 08/24/21 0328 08/25/21 0136 08/26/21 0215  NA 132* 131* 133* 133* 133*   Chronic, systolic and diastolic CHF -Currently remains compensated.  Chronic mild bilateral lower extremity edema.   -Patient noncompliant to beta-blocker.  Blood pressure running in low normal range.  So I would not commit him on beta-blocker  yet.  Prolonged QT interval -QTc 551 ms on EKG on admission.  Minimize or avoid QT prolonging drugs -Repeat EKG  Permanent atrial fibrillation -On low-dose Coreg at home.  Currently NPO.  Continue IV metoprolol 2.5 mg as needed. -not on anticoagulation due to GI bleed/AVM  COPD -Respiratory status stable.    Visual impairment -Reorientation and delirium precaution  Protein-calorie malnutrition, severe -Very frail with significant muscle mass and subcu fat loss.  -Nutrition consulted  Goals of care   Code Status: DNR    Mobility: Encourage ambulation  Nutritional status:  Body mass index is 16.82 kg/m.  Nutrition Problem: Severe Malnutrition Etiology: chronic illness (ESRD on HD) Signs/Symptoms: severe fat depletion, severe muscle depletion, percent weight loss (12% within the last two months) Percent weight loss: 12 %     Diet:  Diet Order             DIET SOFT Room service appropriate? Yes; Fluid consistency: Thin  Diet effective now                   DVT prophylaxis:  heparin injection 5,000 Units Start: 08/22/21 2200   Antimicrobials: Zosyn IV Fluid: Not on IV fluid Consultants: Nephrology, IR, surgery Family Communication: None at bedside  Status is: Inpatient  Continue in-hospital care because: Gradually improving small bowel obstruction Level of care: Med-Surg   Dispo: The patient is from: Home              Anticipated d/c is to: Hopefully home tomorrow              Patient currently is not medically stable to d/c. Difficult to place patient No     Infusions:   piperacillin-tazobactam (ZOSYN)  IV 2.25 g (08/28/21 0326)    Scheduled Meds:  (feeding supplement) PROSource Plus  30 mL Oral BID BM   sodium chloride   Intravenous Once   acetaminophen  650 mg Oral Q6H   Chlorhexidine Gluconate Cloth  6 each Topical Q0600   cinacalcet  30 mg Oral Q T,Th,Sa-HD   docusate sodium  100 mg Oral BID   feeding supplement  1 Container Oral TID  BM   feeding supplement (NEPRO CARB STEADY)  237 mL Oral BID BM   heparin  5,000 Units Subcutaneous Q8H   multivitamin  1 tablet Oral QHS   pantoprazole  40 mg Oral Daily    PRN meds: HYDROmorphone (DILAUDID) injection, metoprolol tartrate, oxyCODONE, prochlorperazine   Antimicrobials: Anti-infectives (From admission, onward)    Start     Dose/Rate Route Frequency Ordered Stop   08/22/21 1915  piperacillin-tazobactam (ZOSYN) IVPB 2.25 g        2.25 g 100 mL/hr over 30 Minutes Intravenous Every 8 hours 08/22/21 1902         Objective: Vitals:   08/28/21 0326 08/28/21 0810  BP: 105/61 105/68  Pulse: 66 74  Resp: 16 18  Temp: 98.2 F (36.8 C) (!) 97.5 F (36.4 C)  SpO2: 100% 100%    Intake/Output Summary (Last 24 hours) at 08/28/2021 1342 Last data filed at 08/28/2021 0326 Gross per 24 hour  Intake 340 ml  Output --  Net 340 ml   Filed Weights   08/26/21 1200 08/26/21 1235 08/26/21 1519  Weight: 60 kg 60 kg 54.7 kg   Weight change:  Body mass index is 16.82 kg/m.   Physical Exam: General exam: Pleasant, middle-aged African-American male.  Pain controlled skin: No rashes, lesions or ulcers. HEENT: Atraumatic, normocephalic, no obvious bleeding Lungs: Clear to auscultation bilaterally CVS: Regular rate and rhythm, no murmur GI/Abd soft, improved abdominal distention and tenderness.  Bowel sound present. CNS: Alert, awake, oriented x3 Psychiatry: Mood appropriate Extremities: Mild bilateral pedal edema  Data Review: I have personally reviewed the laboratory data and studies available.  F/u labs ordered Unresulted Labs (From admission, onward)     Start     Ordered   08/28/21 6811  Basic metabolic panel  ONCE - STAT,   STAT       Question:  Specimen collection method  Answer:  Lab=Lab collect   08/28/21 1341   08/28/21 1341  CBC with Differential/Platelet  ONCE - STAT,   STAT       Question:  Specimen collection method  Answer:  Lab=Lab collect   08/28/21  1340   08/24/21 0500  CBC with Differential/Platelet  Daily,   R,   Status:  Canceled     Question:  Specimen collection method  Answer:  Lab=Lab collect   08/23/21 1528   Signed and Held  Renal function panel  Once,   R       Question:  Specimen collection method  Answer:  Lab=Lab collect   Signed and Held   Signed and Held  Renal function panel  Once,   R       Question:  Specimen collection method  Answer:  Lab=Lab collect   Signed and Held   Signed and Held  CBC  Once,   R       Question:  Specimen collection method  Answer:  Lab=Lab collect   Signed and Held            Signed, Terrilee Croak, MD [facility] 08/28/2021

## 2021-08-29 ENCOUNTER — Other Ambulatory Visit (HOSPITAL_COMMUNITY): Payer: Self-pay

## 2021-08-29 ENCOUNTER — Inpatient Hospital Stay (HOSPITAL_COMMUNITY): Payer: HMO

## 2021-08-29 LAB — CBC
HCT: 23.9 % — ABNORMAL LOW (ref 39.0–52.0)
Hemoglobin: 8.3 g/dL — ABNORMAL LOW (ref 13.0–17.0)
MCH: 33.6 pg (ref 26.0–34.0)
MCHC: 34.7 g/dL (ref 30.0–36.0)
MCV: 96.8 fL (ref 80.0–100.0)
Platelets: 121 10*3/uL — ABNORMAL LOW (ref 150–400)
RBC: 2.47 MIL/uL — ABNORMAL LOW (ref 4.22–5.81)
RDW: 22.7 % — ABNORMAL HIGH (ref 11.5–15.5)
WBC: 4.8 10*3/uL (ref 4.0–10.5)
nRBC: 1.5 % — ABNORMAL HIGH (ref 0.0–0.2)

## 2021-08-29 MED ORDER — FOLIC ACID 1 MG PO TABS
1.0000 mg | ORAL_TABLET | Freq: Every day | ORAL | Status: DC
Start: 2021-08-29 — End: 2021-08-30
  Administered 2021-08-29 – 2021-08-30 (×2): 1 mg via ORAL
  Filled 2021-08-29 (×2): qty 1

## 2021-08-29 MED ORDER — HYDROXYUREA 500 MG PO CAPS
500.0000 mg | ORAL_CAPSULE | Freq: Every day | ORAL | Status: DC
  Administered 2021-08-29: 500 mg via ORAL
  Filled 2021-08-29 (×2): qty 1

## 2021-08-29 MED ORDER — IOHEXOL 300 MG/ML  SOLN
85.0000 mL | Freq: Once | INTRAMUSCULAR | Status: AC | PRN
  Administered 2021-08-29: 85 mL via INTRAVENOUS

## 2021-08-29 NOTE — Progress Notes (Signed)
Dialysis Orders:  ?TTS - [facility] ([place]) ? 4hrs, BFR 400, DFR 500,  EDW 52.5kg, 3K/ 2.5Ca ?No heparin bolus ?Mircera 225 mcg q2wks - last 08/19/21 ?Sensipar 30mg  3x weekly ?  ?Problem/Plan: ?Recurrent SBO-now tolerating solids plan per surgery management, on IV Zosyn  ?ESRD - on HD TTS.HD back on  schedule today.   ?Seen on HD  ?4K bath  ?115/64 2L net UF as tolerated ? ?Hypertension/volume  - BP stable. CXR 08/24/2021 suggestive of CHF. Has been on IVF, now with BLE trace edema. Left OP HD 08/22/2021 6.8 kg above OP EDW. HD 08/25/2021 with next UF 3.5 liters.  Using bed weights in hospital not accurate minimal pedal edema; no change in OP EDW ?Sickle Cell disease: HGB 8.4 08/26/2021. Transfused 2 units PRBCs t03/03/2022. Recent ESA as OP.  ?Combined systolic and diastolic HF: EF 88-50% 27/7412. LV global hypokinesis. Has been on Torsemide to help control volume between dialysis treatments. Continue to optimize volume with HD.   ?Persistent AF/AFL-Not on anticoagulation D/T GIB.  ?  Anemia of CKD -H/O SSD. See # 4 ?  Secondary Hyperparathyroidism -  may need binders when able to eat consistently; no binders on MAR. Continue Sensipar. VDRA on hold D/T high corrected calcium ?6.    Nutrition -  Albumin 1.9. Severe PCM. Consulted dietician. Ordered Boost. Be sure this is ordered for OP clinic so we can send Rx so insurance will pay.  ? ? ?Subjective: No complaints, tolerating soft diet and he feels ready to advance to regular. Rhinorrhea but no epistaxis or fevers. ? ? ?Objective ?Vital signs in last 24 hours: ?Vitals:  ? 08/29/21 0744 08/29/21 0800 08/29/21 0830 08/29/21 0900  ?BP: 119/71 120/62 122/65 115/74  ?Pulse:      ?Resp: 16 16 16 14   ?Temp: 98.2 ?F (36.8 ?C)     ?TempSrc: Oral     ?SpO2: 100%     ?Weight: 57 kg     ? ?Weight change:  ? ?Physical Exam: ?General: Thin alert adult male NAD ?Heart: Occasional regular beat, no MRG rate stable ?Lungs: CTA nonlabored breathing ?Abdomen: NABS,  minimal distention nontender ?Extremities: Trace bilateral edema at ankles, ?Dialysis Access: R arm AVF bruit + ? ? ?Labs: ?Basic Metabolic Panel: ?Recent Labs  ?Lab 08/23/21 ?0621 08/24/21 ?0328 08/25/21 ?0136 08/26/21 ?0215 08/28/21 ?1425  ?NA 131*   < > 133* 133* 135  ?K 4.4   < > 3.5 3.2* 3.1*  ?CL 92*   < > 94* 99 100  ?CO2 29   < > 26 25 27   ?GLUCOSE 85   < > 87 90 94  ?BUN 25*   < > 26* 14 17  ?CREATININE 3.94*   < > 4.84* 2.86* 3.83*  ?CALCIUM 8.4*   < > 8.3* 7.9* 7.8*  ?PHOS 3.9  --   --   --   --   ? < > = values in this interval not displayed.  ? ?Liver Function Tests: ?Recent Labs  ?Lab 08/22/21 ?0945 08/23/21 ?8786  ?AST 38  --   ?ALT 22  --   ?ALKPHOS 173*  --   ?BILITOT 1.6*  --   ?PROT 7.7  --   ?ALBUMIN 2.4* 1.9*  ? ?Recent Labs  ?Lab 08/22/21 ?0945  ?LIPASE 29  ? ?No results for input(s): AMMONIA in the last 168 hours. ?CBC: ?Recent Labs  ?Lab 08/24/21 ?7672 08/25/21 ?0136 08/26/21 ?0215 08/28/21 ?1425 08/29/21 ?0947  ?WBC 3.9* 3.7* 2.2* 3.9*  4.8  ?NEUTROABS 2.3 2.3 1.8 2.0  --   ?HGB 6.3* 7.0* 8.4* 7.9* 8.3*  ?HCT 17.2* 19.3* 23.6* 23.4* 23.9*  ?MCV 97.7 100.5* 94.8 97.1 96.8  ?PLT 131* 130* 122* 116* 121*  ? ?Cardiac Enzymes: ?No results for input(s): CKTOTAL, CKMB, CKMBINDEX, TROPONINI in the last 168 hours. ?CBG: ?No results for input(s): GLUCAP in the last 168 hours. ? ?Studies/Results: ?No results found. ?Medications: ? piperacillin-tazobactam (ZOSYN)  IV 2.25 g (08/29/21 0340)  ? ? (feeding supplement) PROSource Plus  30 mL Oral BID BM  ? sodium chloride   Intravenous Once  ? acetaminophen  650 mg Oral Q6H  ? Chlorhexidine Gluconate Cloth  6 each Topical Q0600  ? cinacalcet  30 mg Oral Q T,Th,Sa-HD  ? docusate sodium  100 mg Oral BID  ? feeding supplement  1 Container Oral TID BM  ? heparin  5,000 Units Subcutaneous Q8H  ? multivitamin  1 tablet Oral QHS  ? pantoprazole  40 mg Oral Daily  ? ? ? ? ?

## 2021-08-29 NOTE — Progress Notes (Signed)
? ?Progress Note ? ?   ?Subjective: ?Still eating with no nausea or vomiting, but still with some abdominal discomfort and feels more bloated today.  Still with some flatus and bowel function. ? ?Objective: ?Vital signs in last 24 hours: ?Temp:  [98 ?F (36.7 ?C)-98.2 ?F (36.8 ?C)] 98.2 ?F (36.8 ?C) (03/14 0744) ?Pulse Rate:  [65-78] 78 (03/14 0300) ?Resp:  [16-18] 16 (03/14 0830) ?BP: (104-122)/(50-71) 122/65 (03/14 0830) ?SpO2:  [100 %] 100 % (03/14 0744) ?Weight:  [57 kg] 57 kg (03/14 0744) ?Last BM Date : 08/28/21 ? ?Intake/Output from previous day: ?03/13 0701 - 03/14 0700 ?In: 650 [P.O.:600; IV Piggyback:50] ?Out: -  ?Intake/Output this shift: ?No intake/output data recorded. ? ?PE: ?Abd: soft, but distended and somewhat tympanitic, moderate diffusely tender ? ? ?Lab Results:  ?Recent Labs  ?  08/28/21 ?1425 08/29/21 ?0705  ?WBC 3.9* 4.8  ?HGB 7.9* 8.3*  ?HCT 23.4* 23.9*  ?PLT 116* 121*  ? ?BMET ?Recent Labs  ?  08/28/21 ?1425  ?NA 135  ?K 3.1*  ?CL 100  ?CO2 27  ?GLUCOSE 94  ?BUN 17  ?CREATININE 3.83*  ?CALCIUM 7.8*  ? ?PT/INR ?No results for input(s): LABPROT, INR in the last 72 hours. ?CMP  ?   ?Component Value Date/Time  ? NA 135 08/28/2021 1425  ? NA 135 05/24/2020 1119  ? K 3.1 (L) 08/28/2021 1425  ? CL 100 08/28/2021 1425  ? CO2 27 08/28/2021 1425  ? GLUCOSE 94 08/28/2021 1425  ? BUN 17 08/28/2021 1425  ? BUN 25 05/24/2020 1119  ? CREATININE 3.83 (H) 08/28/2021 1425  ? CREATININE 2.90 (H) 08/11/2015 1359  ? CALCIUM 7.8 (L) 08/28/2021 1425  ? PROT 7.7 08/22/2021 0945  ? PROT 7.5 05/24/2020 1119  ? ALBUMIN 1.9 (L) 08/23/2021 0263  ? ALBUMIN 4.1 05/24/2020 1119  ? AST 38 08/22/2021 0945  ? ALT 22 08/22/2021 0945  ? ALKPHOS 173 (H) 08/22/2021 0945  ? BILITOT 1.6 (H) 08/22/2021 0945  ? BILITOT 2.4 (H) 05/24/2020 1119  ? GFRNONAA 17 (L) 08/28/2021 1425  ? GFRNONAA 23 (L) 08/11/2015 1359  ? GFRAA 24 (L) 05/24/2020 1119  ? GFRAA 27 (L) 08/11/2015 1359  ? ?Lipase  ?   ?Component Value Date/Time  ? LIPASE 29  08/22/2021 0945  ? ? ? ? ? ?Studies/Results: ?No results found. ? ?Anti-infectives: ?Anti-infectives (From admission, onward)  ? ? Start     Dose/Rate Route Frequency Ordered Stop  ? 08/22/21 1915  piperacillin-tazobactam (ZOSYN) IVPB 2.25 g       ? 2.25 g ?100 mL/hr over 30 Minutes Intravenous Every 8 hours 08/22/21 1902    ? ?  ? ? ? ?Assessment/Plan ? 3 months s/p ex lap with cholecystectomy, SBR ?SBO vs ileus  ?Recurrent Intra-abdominal fluid collection s/p previous drain  ?- CT 3/7 with sbo and possible transition point distal to small bowel anastomosis in RLQ and residual fluid collection in anterior lower abdomen/pelvis measuring 4.5 cm ?- SBO protocol completed. Last abd xray 3/10 with distension but contrast throughout colon and rectum ?- patient with return of bowel function  ?- IR does not feel collection is currently large enough for draining, consider repeat imaging in a few days if not improving  ? ?-patient with more distention today despite no N/V, but with abdominal pain.  Check CT scan today. ?  ?FEN - soft, IVF @50  cc/h, supplements per dietician  ?VTE - SQH ?ID - zosyn 3/7>> ?  ?Recent UGI bleed ?ESRD on  HD ?CHF ?Severe MR, TR ?A fib  ?HTN ?Sickle Cell disease ?Anemia ? ?I reviewed Consultant Nephrology notes, hospitalist notes, last 24 h vitals and pain scores, last 48 h intake and output, and last 24 h labs and trends. ? ? ? LOS: 7 days  ? ?Henreitta Cea, PA-C ?[facility] ?08/29/2021, 9:01 AM ?Please see Amion for pager number during day hours 7:00am-4:30pm ? ?

## 2021-08-29 NOTE — Progress Notes (Signed)
?PROGRESS NOTE ? ?Patrick [name]  ?DOB: 04-Apr-1956  ?PCP: [name], MD ?YIR:[id number]  ?DOA: 08/22/2021 ? LOS: 7 days  ?Hospital Day: 8 ? ?Brief narrative: ?Patrick [name] is a 66 y.o. male with PMH significant for ESRD HD TTS, CHF, sickle cell disease, permanent A-fib, upper GI bleed due to AVMs, laparotomy/cholecystectomy/ischemic small bowel resection in 12/22 and recent hospitalization from SBO that resolved with NGT and pelvic abscess that was treated with drain placement and antibiotics. ? ?During hospitalization in December 2022, he had laparoscopy with subsequent laparotomy.  ?He was subsequently admitted twice after that.  Last hospitalized in 2/13 to 2/24 for bowel obstruction which improved with conservative measures.   ? ?Patient presented to the ED on 3/7 with complaint of severe abdominal pain for a day. ?CT scan showed diffuse small bowel dilatation suggestive of high-grade small bowel obstruction.  Surgical anastomosis in the right lower quadrant of the abdomen with continued stool like material and a transition point distal to the anastomosis.  It also showed a fluid collection in the anterior lower abdominal pelvis, measuring 4.5 cm. ? ?He was admitted to hospitalist service. ?General surgery consultation was obtained. ? ?Subjective: ?Seen and examined this morning in dialysis unit. ?Not in distress. ?Surgery follow-up appreciated. ?Because of persistent abdominal distention and some tenderness despite bowel movements, general surgery plans to repeat CT scan. ? ?Principal Problem: ?  SBO (small bowel obstruction) ([place]) ?Active Problems: ?  Chronic combined systolic and diastolic CHF (congestive heart failure) ([place]) ?  ESRD on HD TTS ?  Prolonged QT interval ?  Sickle cell anemia (HCC) ?  Permanent atrial fibrillation ([place]) ?  Hypothermia ?  Abdominal fluid collection ?  Anemia ?  Protein-calorie malnutrition, severe ?  Visual impairment ?  COPD not affecting current episode of  care [facility]) ?  ? ?Hospital course: ?3 months s/p laparotomy with cholecystectomy ?SBO versus ileus ?-Presented with abdominal pain, nausea, vomiting. ?-CT abdomen finding as above showing SBO with transition point in RLQ. ?-Initially improved with conservative management with bowel rest, NG decompression, IV fluid, pain management, IV Protonix, IV Compazine. ?-NG tube removed.  Having bowel movements.  Able to tolerate soft food.  Surgery following. ?-Surgery follow up appreciated this morning.  Because of persistent abdominal distention and some tenderness despite bowel movements, general surgery plans to repeat CT scan. ? ?Recurrent intra-abdominal collection s/p previous drain ?-4.5 cm collection in the anterior lower abdomen/pelvis.  IR was consulted.  Per IR note, fluid collection is small in size for drainage.  Continue to monitor with antibiotics. ?-Currently on IV Zosyn.   ? ?Acute anemia ?Sickle cell disease ?History of GI bleed from AVM treated with APC on 07/2021 ?-Not in sickle cell crisis clinically but hemoglobin fell down to a low of 6.3 on 3/9. ?-2 units of PRBC transfusion given with dialysis on 3/10.  Hemoglobin better at 8.3 this morning.  Repeat labs tomorrow. ?-Continue folic acid and hydroxyurea. ?Recent Labs  ?  05/31/21 ?0955 05/31/21 ?1518 08/24/21 ?5009 08/25/21 ?0136 08/26/21 ?0215 08/28/21 ?1425 08/29/21 ?3818  ?HGB  --    < > 6.3* 7.0* 8.4* 7.9* 8.3*  ?MCV  --    < > 97.7 100.5* 94.8 97.1 96.8  ?FERRITIN 250  --   --   --   --   --   --   ?TIBC 232*  --   --   --   --   --   --   ?IRON 80  --   --   --   --   --   --   ? < > =  values in this interval not displayed.  ? ?ESRD HD TTS ?-Euvolemic at this time.  Dialysis this morning. ? ?Hyponatremia ?-Sodium level improved.  Continue to monitor r ?Recent Labs  ?Lab 08/23/21 ?0621 08/24/21 ?0328 08/25/21 ?0136 08/26/21 ?0215 08/28/21 ?1425  ?NA 131* 133* 133* 133* 135  ? ?Chronic, systolic and diastolic CHF ?-Currently remains compensated.   Chronic mild bilateral lower extremity edema.   ?-Patient noncompliant to beta-blocker.  Blood pressure in normal range. ? ?Prolonged QT interval ?-QTc 551 ms on EKG on admission.  Minimize or avoid QT prolonging drugs ?-Repeat EKG ? ?Permanent atrial fibrillation ?-Was on low-dose Coreg at home.  Currently remains on hold. Continue IV metoprolol 2.5 mg as needed. ?-not on anticoagulation due to GI bleed/AVM ? ?COPD ?-Respiratory status stable.   ? ?Visual impairment ?-Reorientation and delirium precaution ? ?Protein-calorie malnutrition, severe ?-Very frail with significant muscle mass and subcu fat loss.  ?-Nutrition consulted ? ?Goals of care ?  Code Status: DNR  ? ? ?Mobility: Encourage ambulation ? ?Nutritional status:  ?Body mass index is 17.53 kg/m?Marland Kitchen  ?Nutrition Problem: Severe Malnutrition ?Etiology: chronic illness (ESRD on HD) ?Signs/Symptoms: severe fat depletion, severe muscle depletion, percent weight loss (12% within the last two months) ?Percent weight loss: 12 % ? ? ? ? ?Diet:  ?Diet Order   ? ?       ?  DIET SOFT Room service appropriate? Yes with Assist; Fluid consistency: Thin  Diet effective now       ?  ? ?  ?  ? ?  ? ? ?DVT prophylaxis:  ?heparin injection 5,000 Units Start: 08/22/21 2200 ?  ?Antimicrobials: Zosyn IV ?Fluid: Not on IV fluid ?Consultants: Nephrology, IR, surgery ?Family Communication: None at bedside ? ?Status is: Inpatient ? ?Continue in-hospital care because: Ongoing management for bowel obstruction ?Level of care: Med-Surg  ? ?Dispo: The patient is from: Home ?             Anticipated d/c is to: Pending clinical course ?             Patient currently is not medically stable to d/c. ?Difficult to place patient No ? ? ? ? ?Infusions:  ? piperacillin-tazobactam (ZOSYN)  IV 2.25 g (08/29/21 0340)  ? ? ?Scheduled Meds: ? (feeding supplement) PROSource Plus  30 mL Oral BID BM  ? sodium chloride   Intravenous Once  ? acetaminophen  650 mg Oral Q6H  ? Chlorhexidine Gluconate Cloth  6  each Topical Q0600  ? cinacalcet  30 mg Oral Q T,Th,Sa-HD  ? docusate sodium  100 mg Oral BID  ? feeding supplement  1 Container Oral TID BM  ? heparin  5,000 Units Subcutaneous Q8H  ? multivitamin  1 tablet Oral QHS  ? pantoprazole  40 mg Oral Daily  ? ? ?PRN meds: ?HYDROmorphone (DILAUDID) injection, metoprolol tartrate, oxyCODONE, prochlorperazine  ? ?Antimicrobials: ?Anti-infectives (From admission, onward)  ? ? Start     Dose/Rate Route Frequency Ordered Stop  ? 08/22/21 1915  piperacillin-tazobactam (ZOSYN) IVPB 2.25 g       ? 2.25 g ?100 mL/hr over 30 Minutes Intravenous Every 8 hours 08/22/21 1902    ? ?  ? ? ?Objective: ?Vitals:  ? 08/29/21 1030 08/29/21 1100  ?BP: 118/70 121/65  ?Pulse:    ?Resp: 15 16  ?Temp:    ?SpO2:    ? ? ?Intake/Output Summary (Last 24 hours) at 08/29/2021 1122 ?Last data filed at 08/29/2021 0340 ?Gross per 24 hour  ?  Intake 650 ml  ?Output --  ?Net 650 ml  ? ?Filed Weights  ? 08/26/21 1235 08/26/21 1519 08/29/21 0744  ?Weight: 60 kg 54.7 kg 57 kg  ? ?Weight change:  ?Body mass index is 17.53 kg/m?.  ? ?Physical Exam: ?General exam: Pleasant, middle-aged African-American male.  Pain improving ?skin: No rashes, lesions or ulcers. ?HEENT: Atraumatic, normocephalic, no obvious bleeding ?Lungs: Clear to auscultation bilaterally ?CVS: Regular rate and rhythm, no murmur ?GI/Abd soft, continues to have abdominal distention and some tenderness.  Bowel sound present. ?CNS: Alert, awake, oriented x3 ?Psychiatry: Mood appropriate ?Extremities: Mild bilateral pedal edema ? ?Data Review: I have personally reviewed the laboratory data and studies available. ? ?F/u labs ordered ?Unresulted Labs (From admission, onward)  ? ?  Start     Ordered  ? 08/24/21 0500  CBC with Differential/Platelet  Daily,   R,   Status:  Canceled     ?Question:  Specimen collection method  Answer:  Lab=Lab collect  ? 08/23/21 1528  ? Signed and Held  Renal function panel  Once,   R       ?Question:  Specimen collection  method  Answer:  Lab=Lab collect  ? Signed and Held  ? Signed and Held  Renal function panel  Once,   R       ?Question:  Specimen collection method  Answer:  Lab=Lab collect  ? Signed and Held  ? Signed and

## 2021-08-29 NOTE — Progress Notes (Signed)
Patient refusing to come to dialysis until after breakfast. Will check back ?

## 2021-08-30 MED ORDER — PROSOURCE PLUS PO LIQD: 30.0000 mL | Freq: Two times a day (BID) | ORAL | Status: DC

## 2021-08-30 MED ORDER — HYDROXYUREA 500 MG PO CAPS
500.0000 mg | ORAL_CAPSULE | Freq: Every day | ORAL | Status: DC
  Administered 2021-08-30: 500 mg via ORAL

## 2021-08-30 MED ORDER — CINACALCET HCL 30 MG PO TABS: 30.0000 mg | ORAL_TABLET | ORAL | 0 refills | Status: AC

## 2021-08-30 MED ORDER — RENA-VITE PO TABS
1.0000 | ORAL_TABLET | Freq: Every day | ORAL | 0 refills | Status: AC
Start: 2021-08-30 — End: 2021-11-28

## 2021-08-30 MED ORDER — ALLOPURINOL 100 MG PO TABS
100.0000 mg | ORAL_TABLET | Freq: Every day | ORAL | Status: DC
  Administered 2021-08-30: 100 mg via ORAL
  Filled 2021-08-30: qty 1

## 2021-08-30 MED ORDER — FOLIC ACID 1 MG PO TABS: 1.0000 mg | ORAL_TABLET | Freq: Every day | ORAL | Status: DC

## 2021-08-30 NOTE — Progress Notes (Signed)
Subjective: No complaints, tolerated breakfast, Eager to go home, tolerated dialysis yesterday on schedule ? ?Objective ?Vital signs in last 24 hours: ?Vitals:  ? 08/29/21 1538 08/29/21 2027 08/30/21 0312 08/30/21 0829  ?BP: 107/74 129/70 130/68 133/76  ?Pulse: 79 71 72 85  ?Resp: 17 17 16 17   ?Temp: (!) 97.5 ?F (36.4 ?C) 97.7 ?F (36.5 ?C) 98.4 ?F (36.9 ?C) 98.1 ?F (36.7 ?C)  ?TempSrc: Oral Oral Oral Oral  ?SpO2: 100% 100% 100% 100%  ?Weight:      ? ?Weight change:  ? ?Physical Exam: ?General:  chronically ill appealing male, alert pleasant NAD this morning ?Heart: Rate irregular beat rate stable, no MRG ?Lungs: CTA nonlabored breathing ?Abdomen: NABS, soft NT trace distention ?Extremities: No pedal edema ?Dialysis Access: R A AVF+ bruit ? ?Dialysis Orders:  ?TTS - [place] ([place]) ? 4hrs, BFR 400, DFR 500,  EDW 52.5kg, 3K/ 2.5Ca ?No heparin bolus ?Mircera 225 mcg q2wks - last 08/19/21 ?Sensipar 30mg  3x weekly ?  ?Problem/Plan: ?Recurrent SBO-now tolerating solids plan per surgery management okay for discharge off antibiotics  ?ESRD - on HD TTS.HD back on  schedule now ?Hypertension/volume  - BP stable.  O2 sat 100% on room air /CXR 08/24/2021 suggestive of CHF. Had been on IVF,HD  Using bed weights in hospital not accurate no change in OP EDW ?Sickle Cell disease: HGB 8.3  Transfused 2 units PRBCs 08/25/2021. Recent ESA as OP.  ?Combined systolic and diastolic HF: EF 27-51% 70/0174. LV global hypokinesis. Has been on Torsemide to help control volume between dialysis treatments. Continue to optimize volume with HD.   ?Persistent AF/AFL-Not on anticoagulation D/T GIB.  ?  Anemia of CKD -H/O SSD. See # 4 ?  Secondary Hyperparathyroidism - no phosphorus lab recently, last 3.9 no binders on MAR. Continue Sensipar. VDRA on hold D/T high corrected calcium calcium 7.8 we will continue as outpatient vitamin D follow-up OP labs ?6.    Nutrition -  Albumin 1.9.  On 3/08 severe PCM. Consulted dietician.  Ordered Boost.  ? ? ?Ernest Haber, PA-C ?[facility] ?Beeper [phone] ?08/30/2021,9:35 AM ? LOS: 8 days  ? ?Labs: ?Basic Metabolic Panel: ?Recent Labs  ?Lab 08/25/21 ?0136 08/26/21 ?0215 08/28/21 ?1425  ?NA 133* 133* 135  ?K 3.5 3.2* 3.1*  ?CL 94* 99 100  ?CO2 26 25 27   ?GLUCOSE 87 90 94  ?BUN 26* 14 17  ?CREATININE 4.84* 2.86* 3.83*  ?CALCIUM 8.3* 7.9* 7.8*  ? ?Liver Function Tests: ?No results for input(s): AST, ALT, ALKPHOS, BILITOT, PROT, ALBUMIN in the last 168 hours. ?No results for input(s): LIPASE, AMYLASE in the last 168 hours. ?No results for input(s): AMMONIA in the last 168 hours. ?CBC: ?Recent Labs  ?Lab 08/24/21 ?9163 08/25/21 ?0136 08/26/21 ?0215 08/28/21 ?1425 08/29/21 ?8466  ?WBC 3.9* 3.7* 2.2* 3.9* 4.8  ?NEUTROABS 2.3 2.3 1.8 2.0  --   ?HGB 6.3* 7.0* 8.4* 7.9* 8.3*  ?HCT 17.2* 19.3* 23.6* 23.4* 23.9*  ?MCV 97.7 100.5* 94.8 97.1 96.8  ?PLT 131* 130* 122* 116* 121*  ? ?Cardiac Enzymes: ?No results for input(s): CKTOTAL, CKMB, CKMBINDEX, TROPONINI in the last 168 hours. ?CBG: ?No results for input(s): GLUCAP in the last 168 hours. ? ?Studies/Results: ?CT ABDOMEN PELVIS W CONTRAST ? ?Result Date: 08/29/2021 ?CLINICAL DATA:  Abdominal pain, distension EXAM: CT ABDOMEN AND PELVIS WITH CONTRAST TECHNIQUE: Multidetector CT imaging of the abdomen and pelvis was performed using the standard protocol following bolus administration of intravenous contrast. RADIATION DOSE REDUCTION: This  exam was performed according to the departmental dose-optimization program which includes automated exposure control, adjustment of the mA and/or kV according to patient size and/or use of iterative reconstruction technique. CONTRAST:  38mL OMNIPAQUE IOHEXOL 300 MG/ML  SOLN COMPARISON:  08/22/2021 FINDINGS: Lower chest: Stable trace bilateral pleural effusions. Stable cardiomegaly. Hepatobiliary: Cholecystectomy. No focal liver abnormalities. No biliary duct dilation. Gas within the common bile duct likely related  to prior sphincterotomy. Pancreas: Unremarkable. No pancreatic ductal dilatation or surrounding inflammatory changes. Spleen: Atrophic calcified spleen consistent with auto splenectomy from sickle cell disease. Adrenals/Urinary Tract: Bilateral renal cortical cysts and bilateral renal cortical thinning unchanged. No urinary tract calculi or obstructive uropathy. The adrenals and bladder are grossly unremarkable. Stomach/Bowel: No evidence of bowel obstruction or ileus. Scattered colonic gas fluid levels may reflect diarrhea or recent laxative/enema use. No bowel wall thickening or inflammatory change. Normal appendix. Vascular/Lymphatic: Aortic atherosclerosis. No enlarged abdominal or pelvic lymph nodes. Reproductive: Prostate is unremarkable. Other: Marked cachexia again noted. Trace ascites throughout the abdomen and pelvis. No free intraperitoneal gas. No abdominal wall hernia. Musculoskeletal: Diffuse skeletal sclerosis consistent with known sickle cell disease and end-stage renal disease. No acute bony abnormalities. Reconstructed images demonstrate no additional findings. IMPRESSION: 1. Stable trace bilateral pleural effusions. 2. Stable cardiomegaly. 3. Trace abdominal ascites. 4. No bowel obstruction or ileus at 5. Stable chronic sequela of end-stage renal disease and sickle cell disease. 6. Marked cachexia. 7.  Aortic Atherosclerosis (ICD10-I70.0). Electronically Signed   By: Randa Ngo M.D.   On: 08/29/2021 19:42   ?Medications: ? piperacillin-tazobactam (ZOSYN)  IV 2.25 g (08/30/21 0311)  ? ? (feeding supplement) PROSource Plus  30 mL Oral BID BM  ? sodium chloride   Intravenous Once  ? acetaminophen  650 mg Oral Q6H  ? allopurinol  100 mg Oral Daily  ? Chlorhexidine Gluconate Cloth  6 each Topical Q0600  ? cinacalcet  30 mg Oral Q T,Th,Sa-HD  ? docusate sodium  100 mg Oral BID  ? feeding supplement  1 Container Oral TID BM  ? folic acid  1 mg Oral Daily  ? heparin  5,000 Units Subcutaneous Q8H  ?  hydroxyurea  500 mg Oral QAC breakfast  ? multivitamin  1 tablet Oral QHS  ? pantoprazole  40 mg Oral Daily  ? ? ? ? ?

## 2021-08-30 NOTE — Progress Notes (Signed)
Contacted [place] [facility] and spoke to [place]. Clinic aware pt will d/c today and resume care at clinic tomorrow.  ? ?Melven Sartorius ?Renal Navigator ?[phone] ?

## 2021-08-30 NOTE — Progress Notes (Signed)
Mobility Specialist Progress Note: ? ? 08/30/21 1000  ?Mobility  ?Activity Ambulated with assistance in hallway  ?Level of Assistance Standby assist, set-up cues, supervision of patient - no hands on  ?Assistive Device None  ?Distance Ambulated (ft) 250 ft  ?Activity Response Tolerated well  ?$Mobility charge 1 Mobility  ? ?Pt received in bed willing to participate in mobility. No complaints of pain. Pt left EOB with call bell in reach and all needs met.  ? ?Lise Pincus ?Mobility Specialist ?Primary Phone [phone] ? ?

## 2021-08-30 NOTE — Progress Notes (Signed)
? ?Progress Note ? ?   ?Subjective: ?Feels better.  Crampy pain improving.  Had a large BM this morning.  Tolerating diet.  Eager to go home. ? ?Objective: ?Vital signs in last 24 hours: ?Temp:  [97.5 ?F (36.4 ?C)-98.4 ?F (36.9 ?C)] 98.1 ?F (36.7 ?C) (03/15 1191) ?Pulse Rate:  [68-85] 85 (03/15 0829) ?Resp:  [15-17] 17 (03/15 0829) ?BP: (107-133)/(57-76) 133/76 (03/15 0829) ?SpO2:  [100 %] 100 % (03/15 0829) ?Weight:  [55 kg] 55 kg (03/14 1123) ?Last BM Date : 08/28/21 ? ?Intake/Output from previous day: ?03/14 0701 - 03/15 0700 ?In: 700 [P.O.:600; IV Piggyback:100] ?Out: 2000  ?Intake/Output this shift: ?Total I/O ?In: 220 [P.O.:220] ?Out: -  ? ?PE: ?Abd: soft, but still mildly distended, +BS, less tender this morning. ? ? ?Lab Results:  ?Recent Labs  ?  08/28/21 ?1425 08/29/21 ?0705  ?WBC 3.9* 4.8  ?HGB 7.9* 8.3*  ?HCT 23.4* 23.9*  ?PLT 116* 121*  ? ?BMET ?Recent Labs  ?  08/28/21 ?1425  ?NA 135  ?K 3.1*  ?CL 100  ?CO2 27  ?GLUCOSE 94  ?BUN 17  ?CREATININE 3.83*  ?CALCIUM 7.8*  ? ?PT/INR ?No results for input(s): LABPROT, INR in the last 72 hours. ?CMP  ?   ?Component Value Date/Time  ? NA 135 08/28/2021 1425  ? NA 135 05/24/2020 1119  ? K 3.1 (L) 08/28/2021 1425  ? CL 100 08/28/2021 1425  ? CO2 27 08/28/2021 1425  ? GLUCOSE 94 08/28/2021 1425  ? BUN 17 08/28/2021 1425  ? BUN 25 05/24/2020 1119  ? CREATININE 3.83 (H) 08/28/2021 1425  ? CREATININE 2.90 (H) 08/11/2015 1359  ? CALCIUM 7.8 (L) 08/28/2021 1425  ? PROT 7.7 08/22/2021 0945  ? PROT 7.5 05/24/2020 1119  ? ALBUMIN 1.9 (L) 08/23/2021 4782  ? ALBUMIN 4.1 05/24/2020 1119  ? AST 38 08/22/2021 0945  ? ALT 22 08/22/2021 0945  ? ALKPHOS 173 (H) 08/22/2021 0945  ? BILITOT 1.6 (H) 08/22/2021 0945  ? BILITOT 2.4 (H) 05/24/2020 1119  ? GFRNONAA 17 (L) 08/28/2021 1425  ? GFRNONAA 23 (L) 08/11/2015 1359  ? GFRAA 24 (L) 05/24/2020 1119  ? GFRAA 27 (L) 08/11/2015 1359  ? ?Lipase  ?   ?Component Value Date/Time  ? LIPASE 29 08/22/2021 0945  ? ? ? ? ? ?Studies/Results: ?CT  ABDOMEN PELVIS W CONTRAST ? ?Result Date: 08/29/2021 ?CLINICAL DATA:  Abdominal pain, distension EXAM: CT ABDOMEN AND PELVIS WITH CONTRAST TECHNIQUE: Multidetector CT imaging of the abdomen and pelvis was performed using the standard protocol following bolus administration of intravenous contrast. RADIATION DOSE REDUCTION: This exam was performed according to the departmental dose-optimization program which includes automated exposure control, adjustment of the mA and/or kV according to patient size and/or use of iterative reconstruction technique. CONTRAST:  75mL OMNIPAQUE IOHEXOL 300 MG/ML  SOLN COMPARISON:  08/22/2021 FINDINGS: Lower chest: Stable trace bilateral pleural effusions. Stable cardiomegaly. Hepatobiliary: Cholecystectomy. No focal liver abnormalities. No biliary duct dilation. Gas within the common bile duct likely related to prior sphincterotomy. Pancreas: Unremarkable. No pancreatic ductal dilatation or surrounding inflammatory changes. Spleen: Atrophic calcified spleen consistent with auto splenectomy from sickle cell disease. Adrenals/Urinary Tract: Bilateral renal cortical cysts and bilateral renal cortical thinning unchanged. No urinary tract calculi or obstructive uropathy. The adrenals and bladder are grossly unremarkable. Stomach/Bowel: No evidence of bowel obstruction or ileus. Scattered colonic gas fluid levels may reflect diarrhea or recent laxative/enema use. No bowel wall thickening or inflammatory change. Normal appendix. Vascular/Lymphatic: Aortic atherosclerosis. No  enlarged abdominal or pelvic lymph nodes. Reproductive: Prostate is unremarkable. Other: Marked cachexia again noted. Trace ascites throughout the abdomen and pelvis. No free intraperitoneal gas. No abdominal wall hernia. Musculoskeletal: Diffuse skeletal sclerosis consistent with known sickle cell disease and end-stage renal disease. No acute bony abnormalities. Reconstructed images demonstrate no additional findings.  IMPRESSION: 1. Stable trace bilateral pleural effusions. 2. Stable cardiomegaly. 3. Trace abdominal ascites. 4. No bowel obstruction or ileus at 5. Stable chronic sequela of end-stage renal disease and sickle cell disease. 6. Marked cachexia. 7.  Aortic Atherosclerosis (ICD10-I70.0). Electronically Signed   By: Randa Ngo M.D.   On: 08/29/2021 19:42   ? ?Anti-infectives: ?Anti-infectives (From admission, onward)  ? ? Start     Dose/Rate Route Frequency Ordered Stop  ? 08/22/21 1915  piperacillin-tazobactam (ZOSYN) IVPB 2.25 g       ? 2.25 g ?100 mL/hr over 30 Minutes Intravenous Every 8 hours 08/22/21 1902    ? ?  ? ? ? ?Assessment/Plan ? 3 months s/p ex lap with cholecystectomy, SBR ?SBO vs ileus  ?Recurrent Intra-abdominal fluid collection s/p previous drain  ?- CT 3/7 with sbo and possible transition point distal to small bowel anastomosis in RLQ and residual fluid collection in anterior lower abdomen/pelvis measuring 4.5 cm ?- SBO protocol completed. Last abd xray 3/10 with distension but contrast throughout colon and rectum ?- IR does not feel collection is currently large enough for draining, consider repeat imaging in a few days if not improving  ? ?-patient doing well today and CT scan normal with no evidence of recurrent fluid collection or obstruction. ?-tolerating diet and having bowel function ?-ok for DC home today  ?-follow up with Dr. Donne Hazel as scheduled ?-no further abx therapy likely needed given no evidence of fluid collection on follow up CT scan ?  ?FEN - soft, IVF @50  cc/h, supplements per dietician  ?VTE - SQH ?ID - zosyn 3/7>> ?  ?Recent UGI bleed ?ESRD on HD ?CHF ?Severe MR, TR ?A fib  ?HTN ?Sickle Cell disease ?Anemia ? ?I reviewed Consultant Nephrology notes, hospitalist notes, last 24 h vitals and pain scores, last 48 h intake and output, and last 24 h labs and trends. ? ? ? LOS: 8 days  ? ?Henreitta Cea, PA-C ?[facility] ?08/30/2021, 9:02 AM ?Please see Amion for  pager number during day hours 7:00am-4:30pm ? ?

## 2021-08-30 NOTE — Discharge Summary (Signed)
? ?Physician Discharge Summary  ?[name] FIE:[id number] DOB: 10/03/1955 DOA: 08/22/2021 ? ?PCP: [name], MD ? ?Admit date: 08/22/2021 ?Discharge date: 08/30/2021 ? ?Admitted From: Home ?Discharge disposition: Home ? ?Brief narrative: ?Patrick [name] is a 66 y.o. male with PMH significant for ESRD HD TTS, CHF, sickle cell disease, permanent A-fib, upper GI bleed due to AVMs, laparotomy/cholecystectomy/ischemic small bowel resection in 12/22 and recent hospitalization from SBO that resolved with NGT and pelvic abscess that was treated with drain placement and antibiotics. ? ?During hospitalization in December 2022, he had laparoscopy with subsequent laparotomy.  ?He was subsequently admitted twice after that.  Last hospitalized in 2/13 to 2/24 for bowel obstruction which improved with conservative measures.   ? ?Patient presented to the ED on 3/7 with complaint of severe abdominal pain for a day. ?CT scan showed diffuse small bowel dilatation suggestive of high-grade small bowel obstruction.  Surgical anastomosis in the right lower quadrant of the abdomen with continued stool like material and a transition point distal to the anastomosis.  It also showed a fluid collection in the anterior lower abdominal pelvis, measuring 4.5 cm. ? ?He was admitted to hospitalist service. ?General surgery consultation was obtained. ? ?Subjective: ?Seen and examined this morning ?Lying on bed.  Not in distress.  No new symptoms.  Tolerating diet.  Bowel movement regular. ?CT scan of abdomen repeated yesterday did not show any evidence of bowel obstruction.  Cleared by surgery for discharge today. ? ?Principal Problem: ?  SBO (small bowel obstruction) ([place]) ?Active Problems: ?  Chronic combined systolic and diastolic CHF (congestive heart failure) ([place]) ?  ESRD on HD TTS ?  Prolonged QT interval ?  Sickle cell anemia (HCC) ?  Permanent atrial fibrillation ([place]) ?  Hypothermia ?  Abdominal fluid collection ?   Anemia ?  Protein-calorie malnutrition, severe ?  Visual impairment ?  COPD not affecting current episode of care [facility]) ?  ? ?Hospital course: ?3 months s/p laparotomy with cholecystectomy ?SBO versus ileus ?-Presented with abdominal pain, nausea, vomiting. ?-CT abdomen finding as above showing SBO with transition point in RLQ. ?-Improved with conservative management with bowel rest, NG decompression, IV fluid, pain management, IV Protonix, IV Compazine. ?-NG tube removed.  Having bowel movements.  Able to tolerate soft food.   ?-CT scan of abdomen repeated yesterday did not show any evidence of bowel obstruction.  Cleared by surgery for discharge today. ? ?Recurrent intra-abdominal collection s/p previous drain ?-4.5 cm collection in the anterior lower abdomen/pelvis.  IR was consulted.  Per IR note, fluid collection is small in size for drainage.  Patient was kept on IV Zosyn in the hospital.  Completed 7-day course.  No need of antibiotic at discharge per surgery. ? ?Acute anemia ?Sickle cell disease ?History of GI bleed from AVM treated with APC on 07/2021 ?-Not in sickle cell crisis clinically but hemoglobin fell down to a low of 6.3 on 3/9. ?-2 units of PRBC transfusion given with dialysis on 3/10.  Hemoglobin better at 8.3 on last check on 3/14.   ?-Continue folic acid and hydroxyurea as before ?Recent Labs  ?  05/31/21 ?0955 05/31/21 ?1518 08/24/21 ?1660 08/25/21 ?0136 08/26/21 ?0215 08/28/21 ?1425 08/29/21 ?6301  ?HGB  --    < > 6.3* 7.0* 8.4* 7.9* 8.3*  ?MCV  --    < > 97.7 100.5* 94.8 97.1 96.8  ?FERRITIN 250  --   --   --   --   --   --   ?  TIBC 232*  --   --   --   --   --   --   ?IRON 80  --   --   --   --   --   --   ? < > = values in this interval not displayed.  ? ?ESRD HD TTS ?-Euvolemic at this time.  Continue dialysis as an outpatient. ? ?Hyponatremia ?-Sodium level improved.  ?Recent Labs  ?Lab 08/24/21 ?0328 08/25/21 ?0136 08/26/21 ?0215 08/28/21 ?1425  ?NA 133* 133* 133* 135  ? ?Chronic,  systolic and diastolic CHF ?Permanent A-fib ?-Currently heart rate and heart failure remain compensated.  Chronic mild bilateral lower extremity edema.   ?-Patient noncompliant to beta-blocker.  Blood pressure and heart rate in normal range. ?-not on anticoagulation due to GI bleed/AVM ? ?Prolonged QT interval ?-QTc 551 ms on EKG on admission.  Minimize or avoid QT prolonging drugs ? ?COPD ?-Respiratory status stable.   ? ?Visual impairment ?-Reorientation and delirium precaution ? ?Protein-calorie malnutrition, severe ?-Very frail with significant muscle mass and subcu fat loss.  ?-Nutrition consult appreciated. ? ?Goals of care ?  Code Status: DNR  ? ? ?Mobility: Encourage ambulation ? ?Nutritional status:  ?Body mass index is 16.91 kg/m?Marland Kitchen  ?Nutrition Problem: Severe Malnutrition ?Etiology: chronic illness (ESRD on HD) ?Signs/Symptoms: severe fat depletion, severe muscle depletion, percent weight loss (12% within the last two months) ?Percent weight loss: 12 % ? ? ? ? ? ?Wounds:  ?- ?Incision (Closed) 06/03/21 Flank Left;Lower (Active)  ?Date First Assessed/Time First Assessed: 06/03/21 1742   Location: Flank  Location Orientation: Left;Lower  ?  ?Assessments 06/03/2021  1:00 PM 06/09/2021  7:50 PM  ?Dressing Type Gauze (Comment) Gauze (Comment)  ?Dressing Changed Clean;Dry;Intact  ?Dressing Change Frequency Twice a day Twice a day  ?Site / Wound Assessment -- Dressing in place / Unable to assess  ?Closure Staples --  ?Drainage Amount None --  ?   ?No Linked orders to display  ?   ?Wound / Incision (Open or Dehisced) 06/03/21 Abdomen Left;Medial;Upper (Active)  ?Date First Assessed/Time First Assessed: 06/03/21 1743   Location: Abdomen  Location Orientation: Left;Medial;Upper  ?  ?Assessments 06/03/2021  1:00 PM 06/29/2021  8:30 AM  ?Dressing Type Gauze (Comment) Gauze (Comment)  ?Dressing Changed Changed Changed  ?Dressing Status Clean;Dry;Intact Clean;Dry;Intact  ?Dressing Change Frequency Twice a day Daily   ?Site / Wound Assessment Clean;Dry;Red --  ?Closure None --  ?Drainage Amount Minimal --  ?Drainage Description Serosanguineous --  ?Treatment Other (Comment) --  ?   ?No Linked orders to display  ?   ?Wound / Incision (Open or Dehisced) 08/01/21 Abdomen Medial (Active)  ?Date First Assessed/Time First Assessed: 08/01/21 0339   Location: Abdomen  Location Orientation: Medial  Present on Admission: Yes  ?  ?Assessments 08/01/2021  3:40 AM 08/29/2021  8:05 PM  ?Dressing Type Gauze (Comment) Foam - Lift dressing to assess site every shift  ?Dressing Changed New --  ?Dressing Status Clean;Dry;Intact Clean, Dry, Intact  ?Dressing Change Frequency Daily --  ?Site / Wound Assessment Dressing in place / Unable to assess Dressing in place / Unable to assess  ?Drainage Amount None --  ?   ?No Linked orders to display  ?   ?Pressure Injury 08/01/21 Sacrum Mid Stage 2 -  Partial thickness loss of dermis presenting as a shallow open injury with a red, pink wound bed without slough. (Active)  ?Date First Assessed/Time First Assessed: 08/01/21 0341   Location: Sacrum  Location Orientation: Mid  Staging: Stage 2 -  Partial thickness loss of dermis presenting as a shallow open injury with a red, pink wound bed without slough.  Present on Admis...  ?  ?Assessments 08/01/2021  3:40 AM 08/29/2021  8:05 PM  ?Dressing Type Foam - Lift dressing to assess site every shift Foam - Lift dressing to assess site every shift  ?Dressing Clean;Intact;Dry Clean, Dry, Intact  ?Dressing Change Frequency Every 3 days --  ?State of Healing Early/partial granulation --  ?Site / Wound Assessment Pink;Granulation tissue --  ?% Wound base Red or Granulating 100% --  ?Peri-wound Assessment Pink;Erythema (non-blanchable) --  ?Wound Length (cm) 0.5 cm --  ?Wound Width (cm) 0.5 cm --  ?Wound Depth (cm) 0.1 cm --  ?Wound Surface Area (cm^2) 0.25 cm^2 --  ?Wound Volume (cm^3) 0.03 cm^3 --  ?Margins Unattached edges (unapproximated) --  ?Drainage Amount None --   ?Treatment Cleansed --  ?   ?No Linked orders to display  ?   ?Pressure Injury 08/01/21 Sacrum Left Stage 2 -  Partial thickness loss of dermis presenting as a shallow open injury with a red, pink wound bed without slou

## 2021-08-30 NOTE — Plan of Care (Signed)
?  Problem: Education: ?Goal: Knowledge of General Education information will improve ?Description: Including pain rating scale, medication(s)/side effects and non-pharmacologic comfort measures ?08/30/2021 1257 by Santa Lighter, RN ?Outcome: Adequate for Discharge ?08/30/2021 0856 by Santa Lighter, RN ?Outcome: Progressing ?  ?Problem: Health Behavior/Discharge Planning: ?Goal: Ability to manage health-related needs will improve ?08/30/2021 1257 by Santa Lighter, RN ?Outcome: Adequate for Discharge ?08/30/2021 0856 by Santa Lighter, RN ?Outcome: Progressing ?  ?Problem: Clinical Measurements: ?Goal: Ability to maintain clinical measurements within normal limits will improve ?Outcome: Adequate for Discharge ?Goal: Will remain free from infection ?Outcome: Adequate for Discharge ?Goal: Diagnostic test results will improve ?Outcome: Adequate for Discharge ?Goal: Respiratory complications will improve ?Outcome: Adequate for Discharge ?Goal: Cardiovascular complication will be avoided ?Outcome: Adequate for Discharge ?  ?Problem: Activity: ?Goal: Risk for activity intolerance will decrease ?Outcome: Adequate for Discharge ?  ?Problem: Nutrition: ?Goal: Adequate nutrition will be maintained ?Outcome: Adequate for Discharge ?  ?Problem: Coping: ?Goal: Level of anxiety will decrease ?Outcome: Adequate for Discharge ?  ?Problem: Elimination: ?Goal: Will not experience complications related to bowel motility ?Outcome: Adequate for Discharge ?Goal: Will not experience complications related to urinary retention ?Outcome: Adequate for Discharge ?  ?Problem: Pain Managment: ?Goal: General experience of comfort will improve ?Outcome: Adequate for Discharge ?  ?Problem: Safety: ?Goal: Ability to remain free from injury will improve ?Outcome: Adequate for Discharge ?  ?Problem: Skin Integrity: ?Goal: Risk for impaired skin integrity will decrease ?Outcome: Adequate for Discharge ?  ?Problem: Malnutrition  (NI-5.2) ?Goal: Food and/or  nutrient delivery ?Description: Individualized approach for food/nutrient provision. ?Outcome: Adequate for Discharge ?  ?

## 2021-08-30 NOTE — Plan of Care (Signed)
  Problem: Education: Goal: Knowledge of General Education information will improve Description Including pain rating scale, medication(s)/side effects and non-pharmacologic comfort measures Outcome: Progressing   Problem: Health Behavior/Discharge Planning: Goal: Ability to manage health-related needs will improve Outcome: Progressing   

## 2021-08-31 ENCOUNTER — Telehealth: Payer: Self-pay | Admitting: Nephrology

## 2021-08-31 ENCOUNTER — Telehealth: Payer: Self-pay

## 2021-08-31 NOTE — Telephone Encounter (Signed)
Transition of Care Contact from Inpatient Facility ?  ?Date of Discharge: 08/30/21 ?Date of Contact: 08/31/21 ?Method of contact: phone ?Talked to patient ?  ?Patient contacted to discuss transition of care form recent hospitaliztion. Patient was admitted to [facility] from 08/22/21 to 08/30/21 with the discharge diagnosis of SBO vs ileus, recurrent intra-abdominal collection s/p previous drain, and acute anemia   ? ?Medication changes were reviewed.  ?Patient will follow up with is outpatient dialysis center 09/02/21.  ?Other follow up needs include none identified. ?  ?Jen Mow, PA-C ?[facility] ?Pager: [phone] ?  ? ?

## 2021-08-31 NOTE — Telephone Encounter (Signed)
Oxycodone  °

## 2021-09-01 ENCOUNTER — Telehealth: Payer: Self-pay | Admitting: Nurse Practitioner

## 2021-09-01 DIAGNOSIS — K56609 Unspecified intestinal obstruction, unspecified as to partial versus complete obstruction: Secondary | ICD-10-CM | POA: Diagnosis not present

## 2021-09-01 DIAGNOSIS — Z992 Dependence on renal dialysis: Secondary | ICD-10-CM | POA: Diagnosis not present

## 2021-09-01 DIAGNOSIS — N186 End stage renal disease: Secondary | ICD-10-CM | POA: Diagnosis not present

## 2021-09-01 NOTE — Telephone Encounter (Signed)
Pt just discharged from hospital, has wound on belly, been hospitalized 3 times this year and is legally blind. Glory Buff from [facility] would like know if you agree on referring him to home health. Call her back at [phone] ?

## 2021-09-02 DIAGNOSIS — D57819 Other sickle-cell disorders with crisis, unspecified: Secondary | ICD-10-CM | POA: Diagnosis not present

## 2021-09-02 DIAGNOSIS — D631 Anemia in chronic kidney disease: Secondary | ICD-10-CM | POA: Diagnosis not present

## 2021-09-02 DIAGNOSIS — N2581 Secondary hyperparathyroidism of renal origin: Secondary | ICD-10-CM | POA: Diagnosis not present

## 2021-09-02 DIAGNOSIS — N186 End stage renal disease: Secondary | ICD-10-CM | POA: Diagnosis not present

## 2021-09-02 DIAGNOSIS — D57219 Sickle-cell/Hb-C disease with crisis, unspecified: Secondary | ICD-10-CM | POA: Diagnosis not present

## 2021-09-02 DIAGNOSIS — Z992 Dependence on renal dialysis: Secondary | ICD-10-CM | POA: Diagnosis not present

## 2021-09-02 DIAGNOSIS — D571 Sickle-cell disease without crisis: Secondary | ICD-10-CM | POA: Diagnosis not present

## 2021-09-03 ENCOUNTER — Telehealth: Payer: Self-pay | Admitting: Nurse Practitioner

## 2021-09-04 ENCOUNTER — Encounter: Payer: Self-pay | Admitting: Nurse Practitioner

## 2021-09-04 ENCOUNTER — Other Ambulatory Visit: Payer: Self-pay

## 2021-09-04 ENCOUNTER — Ambulatory Visit (INDEPENDENT_AMBULATORY_CARE_PROVIDER_SITE_OTHER): Payer: HMO | Admitting: Nurse Practitioner

## 2021-09-04 VITALS — BP 110/65 | HR 83 | Temp 97.6°F | Ht 71.0 in | Wt 146.0 lb

## 2021-09-04 DIAGNOSIS — R197 Diarrhea, unspecified: Secondary | ICD-10-CM | POA: Diagnosis not present

## 2021-09-04 DIAGNOSIS — R0989 Other specified symptoms and signs involving the circulatory and respiratory systems: Secondary | ICD-10-CM

## 2021-09-04 DIAGNOSIS — R1030 Lower abdominal pain, unspecified: Secondary | ICD-10-CM

## 2021-09-04 MED ORDER — FLUTICASONE PROPIONATE 50 MCG/ACT NA SUSP: 2.0000 | Freq: Every day | NASAL | 6 refills | Status: DC

## 2021-09-04 NOTE — Progress Notes (Signed)
? ?[facility] ?[address], [place]  [address] ?Phone:  [phone]   Fax:  [phone] ?Subjective:  ? Patient ID: Patrick [name], male    DOB: October 05, 1955, 66 y.o.   MRN: [id number] ? ?Chief Complaint  ?Patient presents with  ? Hospitalization Follow-up  ?  Pt is here for a hospital follow up. Pt stated he has abdominal pain would like more pain medications also pt would like some medication for his runny nose  ? ?HPI ?Patrick [name] 67 y.o. male  has a past medical history of Blood dyscrasia, Blood transfusion, CHF (congestive heart failure) ([place]), Chronic kidney disease (CKD), stage III (moderate) ([place]), Dialysis patient ([place]) (04/2019), Gout, Headache(784.0), Legally blind, Shortness of breath dyspnea, Sickle cell disease, type SS ([place]), Swelling abdomen, Swelling of extremity, left, and Swelling of extremity, right. To the [facility] for hospital follow up. ? ?States that he has been in and out of the hospital since Thanksgiving 2022. Was admitted on 08/22/21 for intestinal obstruction, after arriving to the ED for abdominal pain. Did not require any surgical repair of obstruction. Pain improved after discharge from hospital, but restarted 3 days ago. States that pain located in the diffuse lower abdomen, rates 7/10 and describes as indescribable. Has not taken any medications for symptoms. Denies any nausea or vomiting, endorses some diarrhea. Has three loose stools per day. Has follow up appointment with general surgery tomorrow. Denies any fever.  ? ?Also concerned about runny nose for the past several weeks. States that he has had runny nose since surgery in February. Has not taken any medications for symptoms. Requesting allergy medication. Denies any other concerns today. ? ?Denies any fatigue, chest pain, shortness of breath, HA or dizziness. Denies any blurred vision, numbness or tingling. ? ?Past Medical History:  ?Diagnosis Date  ? Blood dyscrasia   ? Blood  transfusion   ? "I've had a bunch of them"  ? CHF (congestive heart failure) ([place])   ? followed by hf clinic  ? Chronic kidney disease (CKD), stage III (moderate) (HCC)   ? Dialysis patient [facility]) 04/2019  ? AV fistula (right arm)  ? Gout   ? Headache(784.0)   ? "probably weekly" (01/13/2014)  ? Legally blind   ? Shortness of breath dyspnea   ? Sickle cell disease, type SS ([place])   ? Swelling abdomen   ? Swelling of extremity, left   ? Swelling of extremity, right   ? ? ?Past Surgical History:  ?Procedure Laterality Date  ? A/V FISTULAGRAM Right 07/29/2020  ? Procedure: A/V FISTULAGRAM - Right Arm;  Surgeon: Angelia Mould, MD;  Location: [place] [facility];  Service: Cardiovascular;  Laterality: Right;  ? BASCILIC VEIN TRANSPOSITION Right 04/12/2014  ? Procedure: BASILIC VEIN TRANSPOSITION;  Surgeon: Elam Dutch, MD;  Location: [place];  Service: Vascular;  Laterality: Right;  ? BIOPSY  08/04/2020  ? Procedure: BIOPSY;  Surgeon: Ladene Artist, MD;  Location: [place];  Service: Gastroenterology;;  ? BIOPSY  06/28/2021  ? Procedure: BIOPSY;  Surgeon: Jerene Bears, MD;  Location: [place];  Service: Gastroenterology;;  ? BOWEL RESECTION N/A 05/24/2021  ? Procedure: SMALL BOWEL RESECTION;  Surgeon: Rolm Bookbinder, MD;  Location: [place];  Service: General;  Laterality: N/A;  ? CARDIAC CATHETERIZATION  05/2011  ? CARDIOVERSION N/A 06/05/2018  ? Procedure: CARDIOVERSION;  Surgeon: Jolaine Artist, MD;  Location: [place];  Service: Cardiovascular;  Laterality: N/A;  ? CARDIOVERSION  N/A 10/06/2019  ? Procedure: CARDIOVERSION;  Surgeon: Jolaine Artist, MD;  Location: [place];  Service: Cardiovascular;  Laterality: N/A;  ? CHOLECYSTECTOMY N/A 05/24/2021  ? Procedure: OPEN CHOLECYSTECTOMY;  Surgeon: Rolm Bookbinder, MD;  Location: [place];  Service: General;  Laterality: N/A;  ? COLONOSCOPY WITH PROPOFOL N/A 09/24/2017  ? Procedure: COLONOSCOPY WITH PROPOFOL;  Surgeon: Jackquline Denmark, MD;   Location: [facility] ENDOSCOPY;  Service: Endoscopy;  Laterality: N/A;  ? ESOPHAGOGASTRODUODENOSCOPY N/A 09/19/2017  ? Procedure: ESOPHAGOGASTRODUODENOSCOPY (EGD);  Surgeon: Jackquline Denmark, MD;  Location: [facility] ENDOSCOPY;  Service: Endoscopy;  Laterality: N/A;  ? ESOPHAGOGASTRODUODENOSCOPY N/A 05/29/2021  ? Procedure: ESOPHAGOGASTRODUODENOSCOPY (EGD);  Surgeon: Milus Banister, MD;  Location: [facility] ENDOSCOPY;  Service: Gastroenterology;  Laterality: N/A;  ? ESOPHAGOGASTRODUODENOSCOPY (EGD) WITH PROPOFOL N/A 08/04/2020  ? Procedure: ESOPHAGOGASTRODUODENOSCOPY (EGD) WITH PROPOFOL;  Surgeon: Ladene Artist, MD;  Location: [place];  Service: Gastroenterology;  Laterality: N/A;  ? ESOPHAGOGASTRODUODENOSCOPY (EGD) WITH PROPOFOL N/A 06/28/2021  ? Procedure: ESOPHAGOGASTRODUODENOSCOPY (EGD) WITH PROPOFOL;  Surgeon: Jerene Bears, MD;  Location: [facility] ENDOSCOPY;  Service: Gastroenterology;  Laterality: N/A;  ? ESOPHAGOGASTRODUODENOSCOPY (EGD) WITH PROPOFOL N/A 08/02/2021  ? Procedure: ESOPHAGOGASTRODUODENOSCOPY (EGD) WITH PROPOFOL;  Surgeon: Lavena Bullion, DO;  Location: [place] ENDOSCOPY;  Service: Gastroenterology;  Laterality: N/A;  ? ESOPHAGOGASTRODUODENOSCOPY (EGD) WITH PROPOFOL N/A 08/10/2021  ? Procedure: ESOPHAGOGASTRODUODENOSCOPY (EGD) WITH PROPOFOL;  Surgeon: Jackquline Denmark, MD;  Location: [place];  Service: Gastroenterology;  Laterality: N/A;  ? HEMOSTASIS CLIP PLACEMENT  05/29/2021  ? Procedure: HEMOSTASIS CLIP PLACEMENT;  Surgeon: Milus Banister, MD;  Location: [place];  Service: Gastroenterology;;  ? HOT HEMOSTASIS N/A 05/29/2021  ? Procedure: HOT HEMOSTASIS (ARGON PLASMA COAGULATION/BICAP);  Surgeon: Milus Banister, MD;  Location: [facility] ENDOSCOPY;  Service: Gastroenterology;  Laterality: N/A;  ? HOT HEMOSTASIS N/A 06/28/2021  ? Procedure: HOT HEMOSTASIS (ARGON PLASMA COAGULATION/BICAP);  Surgeon: Jerene Bears, MD;  Location: [facility] ENDOSCOPY;  Service: Gastroenterology;  Laterality: N/A;  ? HOT HEMOSTASIS N/A 08/02/2021   ? Procedure: HOT HEMOSTASIS (ARGON PLASMA COAGULATION/BICAP);  Surgeon: Lavena Bullion, DO;  Location: [facility] ENDOSCOPY;  Service: Gastroenterology;  Laterality: N/A;  ? HOT HEMOSTASIS N/A 08/10/2021  ? Procedure: HOT HEMOSTASIS (ARGON PLASMA COAGULATION/BICAP);  Surgeon: Jackquline Denmark, MD;  Location: [facility] ENDOSCOPY;  Service: Gastroenterology;  Laterality: N/A;  ? IR PERC TUN PERIT CATH WO PORT S&I /IMAG  05/23/2021  ? IR REMOVAL TUN CV CATH W/O FL  06/10/2021  ? IR US GUIDE VASC ACCESS RIGHT  05/23/2021  ? LAPAROSCOPY N/A 05/24/2021  ? Procedure: LAPAROSCOPY DIAGNOSTIC;  Surgeon: Rolm Bookbinder, MD;  Location: [place];  Service: General;  Laterality: N/A;  ? LAPAROTOMY N/A 05/24/2021  ? Procedure: EXPLORATORY LAPAROTOMY;  Surgeon: Rolm Bookbinder, MD;  Location: [place];  Service: General;  Laterality: N/A;  ? LEFT AND RIGHT HEART CATHETERIZATION WITH CORONARY ANGIOGRAM N/A 06/11/2011  ? Procedure: LEFT AND RIGHT HEART CATHETERIZATION WITH CORONARY ANGIOGRAM;  Surgeon: Larey Dresser, MD;  Location: [facility] CATH LAB;  Service: Cardiovascular;  Laterality: N/A;  ? PERIPHERAL VASCULAR BALLOON ANGIOPLASTY Right 07/29/2020  ? Procedure: PERIPHERAL VASCULAR BALLOON ANGIOPLASTY;  Surgeon: Angelia Mould, MD;  Location: [place] CV LAB;  Service: Cardiovascular;  Laterality: Right;  arm fistula  ? REVISON OF ARTERIOVENOUS FISTULA Right 08/08/2020  ? Procedure: ANEURYSM EXCISION OF RIGHT UPPER EXTREMITY ARTERIOVENOUS FISTULA;  Surgeon: Angelia Mould, MD;  Location: [place];  Service: Vascular;  Laterality: Right;  ? SCLEROTHERAPY  05/29/2021  ? Procedure: SCLEROTHERAPY;  Surgeon: Milus Banister, MD;  Location: [facility] ENDOSCOPY;  Service: Gastroenterology;;  ? [place]  05/29/2021  ? Procedure: STENT REMOVAL;  Surgeon: Milus Banister, MD;  Location: [facility] ENDOSCOPY;  Service: Gastroenterology;;  ? TEE WITHOUT CARDIOVERSION N/A 12/27/2015  ? Procedure: TRANSESOPHAGEAL ECHOCARDIOGRAM (TEE);  Surgeon: Sueanne Margarita, MD;  Location: [place];  Service: Cardiovascular;  Laterality: N/A;  ? ? ?Family History  ?Problem Relation Age of Onset  ? Alcohol abuse Mother   ? Liver disease Mother   ? Cirrhosis Mother   ? Heart

## 2021-09-04 NOTE — Patient Instructions (Signed)
You were seen today in the [facility] for hospital follow up. Labs were collected, results will be available via MyChart or, if abnormal, you will be contacted by clinic staff. You were prescribed medications, please take as directed. Please follow up in 3 mths for reevaluation. Please maintain follow up with general surgery tomorrow.  ?

## 2021-09-04 NOTE — Telephone Encounter (Signed)
Oxycodone  °

## 2021-09-05 DIAGNOSIS — D57219 Sickle-cell/Hb-C disease with crisis, unspecified: Secondary | ICD-10-CM | POA: Diagnosis not present

## 2021-09-05 DIAGNOSIS — D57819 Other sickle-cell disorders with crisis, unspecified: Secondary | ICD-10-CM | POA: Diagnosis not present

## 2021-09-05 DIAGNOSIS — D631 Anemia in chronic kidney disease: Secondary | ICD-10-CM | POA: Diagnosis not present

## 2021-09-05 DIAGNOSIS — Z992 Dependence on renal dialysis: Secondary | ICD-10-CM | POA: Diagnosis not present

## 2021-09-05 DIAGNOSIS — N186 End stage renal disease: Secondary | ICD-10-CM | POA: Diagnosis not present

## 2021-09-05 DIAGNOSIS — N2581 Secondary hyperparathyroidism of renal origin: Secondary | ICD-10-CM | POA: Diagnosis not present

## 2021-09-05 DIAGNOSIS — D509 Iron deficiency anemia, unspecified: Secondary | ICD-10-CM | POA: Diagnosis not present

## 2021-09-05 DIAGNOSIS — D571 Sickle-cell disease without crisis: Secondary | ICD-10-CM | POA: Diagnosis not present

## 2021-09-06 LAB — CBC WITH DIFFERENTIAL/PLATELET
Basophils Absolute: 0.1 10*3/uL (ref 0.0–0.2)
Basos: 1 %
EOS (ABSOLUTE): 0 10*3/uL (ref 0.0–0.4)
Eos: 0 %
Hematocrit: 19.9 % — ABNORMAL LOW (ref 37.5–51.0)
Hemoglobin: 7.1 g/dL — ABNORMAL LOW (ref 13.0–17.7)
Lymphocytes Absolute: 0.8 10*3/uL (ref 0.7–3.1)
Lymphs: 14 %
MCH: 33.6 pg — ABNORMAL HIGH (ref 26.6–33.0)
MCHC: 35.7 g/dL (ref 31.5–35.7)
MCV: 94 fL (ref 79–97)
Monocytes Absolute: 1.6 10*3/uL — ABNORMAL HIGH (ref 0.1–0.9)
Monocytes: 27 %
NRBC: 4 % — ABNORMAL HIGH (ref 0–0)
Neutrophils Absolute: 3.4 10*3/uL (ref 1.4–7.0)
Neutrophils: 58 %
Platelets: 174 10*3/uL (ref 150–450)
RBC: 2.11 x10E6/uL — CL (ref 4.14–5.80)
RDW: 22.4 % — ABNORMAL HIGH (ref 11.6–15.4)
WBC: 5.8 10*3/uL (ref 3.4–10.8)

## 2021-09-06 LAB — COMPREHENSIVE METABOLIC PANEL
ALT: 19 IU/L (ref 0–44)
AST: 26 IU/L (ref 0–40)
Albumin/Globulin Ratio: 0.7 — ABNORMAL LOW (ref 1.2–2.2)
Albumin: 2.9 g/dL — ABNORMAL LOW (ref 3.8–4.8)
Alkaline Phosphatase: 183 IU/L — ABNORMAL HIGH (ref 44–121)
BUN/Creatinine Ratio: 8 — ABNORMAL LOW (ref 10–24)
BUN: 34 mg/dL — ABNORMAL HIGH (ref 8–27)
Bilirubin Total: 1.1 mg/dL (ref 0.0–1.2)
CO2: 22 mmol/L (ref 20–29)
Calcium: 8.2 mg/dL — ABNORMAL LOW (ref 8.6–10.2)
Chloride: 95 mmol/L — ABNORMAL LOW (ref 96–106)
Creatinine, Ser: 4.08 mg/dL — ABNORMAL HIGH (ref 0.76–1.27)
Globulin, Total: 3.9 g/dL (ref 1.5–4.5)
Glucose: 91 mg/dL (ref 70–99)
Potassium: 5 mmol/L (ref 3.5–5.2)
Sodium: 129 mmol/L — ABNORMAL LOW (ref 134–144)
Total Protein: 6.8 g/dL (ref 6.0–8.5)
eGFR: 16 mL/min/{1.73_m2} — ABNORMAL LOW (ref >59)

## 2021-09-06 LAB — LIPASE: Lipase: 43 U/L (ref 13–78)

## 2021-09-12 ENCOUNTER — Telehealth (HOSPITAL_COMMUNITY): Payer: Self-pay

## 2021-09-12 NOTE — Telephone Encounter (Signed)
Spoke with clinical staff at HD center who called trying to arrange a blood transfusion for this pt with a Hgb 6.8. Provided with the next date for transfusion when patient care center can accommodate this would be April 4th. HD center staff states they will try to arrange transfusion elsewhere, inquired about another center, encourage staff to call [facility] or possibly have pt go to ED as he states he is having trouble moving today at home  and missed his dialysis appointment today. Staff verbalized understanding.   ?

## 2021-09-13 ENCOUNTER — Inpatient Hospital Stay (HOSPITAL_COMMUNITY)
Admission: EM | Admit: 2021-09-13 | Discharge: 2021-09-17 | DRG: 811 | Disposition: A | Discharge status: Home / Self Care | Payer: HMO | Attending: Internal Medicine | Admitting: Internal Medicine

## 2021-09-13 ENCOUNTER — Other Ambulatory Visit: Payer: Self-pay

## 2021-09-13 ENCOUNTER — Encounter (HOSPITAL_COMMUNITY): Payer: Self-pay

## 2021-09-13 ENCOUNTER — Emergency Department (HOSPITAL_COMMUNITY): Payer: HMO

## 2021-09-13 DIAGNOSIS — Z91128 Patient's intentional underdosing of medication regimen for other reason: Secondary | ICD-10-CM | POA: Diagnosis not present

## 2021-09-13 DIAGNOSIS — J449 Chronic obstructive pulmonary disease, unspecified: Secondary | ICD-10-CM | POA: Diagnosis present

## 2021-09-13 DIAGNOSIS — I4821 Permanent atrial fibrillation: Secondary | ICD-10-CM | POA: Diagnosis not present

## 2021-09-13 DIAGNOSIS — I120 Hypertensive chronic kidney disease with stage 5 chronic kidney disease or end stage renal disease: Secondary | ICD-10-CM | POA: Diagnosis not present

## 2021-09-13 DIAGNOSIS — R531 Weakness: Secondary | ICD-10-CM | POA: Diagnosis not present

## 2021-09-13 DIAGNOSIS — R9431 Abnormal electrocardiogram [ECG] [EKG]: Secondary | ICD-10-CM | POA: Diagnosis present

## 2021-09-13 DIAGNOSIS — D571 Sickle-cell disease without crisis: Secondary | ICD-10-CM | POA: Diagnosis not present

## 2021-09-13 DIAGNOSIS — Z992 Dependence on renal dialysis: Secondary | ICD-10-CM

## 2021-09-13 DIAGNOSIS — Z811 Family history of alcohol abuse and dependence: Secondary | ICD-10-CM | POA: Diagnosis not present

## 2021-09-13 DIAGNOSIS — N186 End stage renal disease: Secondary | ICD-10-CM

## 2021-09-13 DIAGNOSIS — D5700 Hb-SS disease with crisis, unspecified: Secondary | ICD-10-CM | POA: Diagnosis not present

## 2021-09-13 DIAGNOSIS — I517 Cardiomegaly: Secondary | ICD-10-CM | POA: Diagnosis not present

## 2021-09-13 DIAGNOSIS — Z87891 Personal history of nicotine dependence: Secondary | ICD-10-CM

## 2021-09-13 DIAGNOSIS — I10 Essential (primary) hypertension: Secondary | ICD-10-CM | POA: Diagnosis present

## 2021-09-13 DIAGNOSIS — Z79899 Other long term (current) drug therapy: Secondary | ICD-10-CM | POA: Diagnosis not present

## 2021-09-13 DIAGNOSIS — I132 Hypertensive heart and chronic kidney disease with heart failure and with stage 5 chronic kidney disease, or end stage renal disease: Secondary | ICD-10-CM | POA: Diagnosis not present

## 2021-09-13 DIAGNOSIS — Z9049 Acquired absence of other specified parts of digestive tract: Secondary | ICD-10-CM

## 2021-09-13 DIAGNOSIS — I4891 Unspecified atrial fibrillation: Secondary | ICD-10-CM | POA: Diagnosis not present

## 2021-09-13 DIAGNOSIS — Y92009 Unspecified place in unspecified non-institutional (private) residence as the place of occurrence of the external cause: Secondary | ICD-10-CM | POA: Diagnosis not present

## 2021-09-13 DIAGNOSIS — G894 Chronic pain syndrome: Secondary | ICD-10-CM | POA: Diagnosis not present

## 2021-09-13 DIAGNOSIS — H548 Legal blindness, as defined in USA: Secondary | ICD-10-CM | POA: Diagnosis not present

## 2021-09-13 DIAGNOSIS — I482 Chronic atrial fibrillation, unspecified: Secondary | ICD-10-CM

## 2021-09-13 DIAGNOSIS — E8770 Fluid overload, unspecified: Secondary | ICD-10-CM | POA: Diagnosis present

## 2021-09-13 DIAGNOSIS — J811 Chronic pulmonary edema: Secondary | ICD-10-CM | POA: Diagnosis not present

## 2021-09-13 DIAGNOSIS — T501X6A Underdosing of loop [high-ceiling] diuretics, initial encounter: Secondary | ICD-10-CM | POA: Diagnosis present

## 2021-09-13 DIAGNOSIS — I158 Other secondary hypertension: Secondary | ICD-10-CM | POA: Diagnosis not present

## 2021-09-13 DIAGNOSIS — D649 Anemia, unspecified: Secondary | ICD-10-CM | POA: Diagnosis present

## 2021-09-13 DIAGNOSIS — N2581 Secondary hyperparathyroidism of renal origin: Secondary | ICD-10-CM | POA: Diagnosis present

## 2021-09-13 DIAGNOSIS — D631 Anemia in chronic kidney disease: Secondary | ICD-10-CM | POA: Diagnosis present

## 2021-09-13 DIAGNOSIS — I4819 Other persistent atrial fibrillation: Secondary | ICD-10-CM

## 2021-09-13 DIAGNOSIS — Z8249 Family history of ischemic heart disease and other diseases of the circulatory system: Secondary | ICD-10-CM

## 2021-09-13 DIAGNOSIS — R0602 Shortness of breath: Secondary | ICD-10-CM | POA: Diagnosis not present

## 2021-09-13 DIAGNOSIS — I5042 Chronic combined systolic (congestive) and diastolic (congestive) heart failure: Secondary | ICD-10-CM | POA: Diagnosis not present

## 2021-09-13 DIAGNOSIS — Z91158 Patient's noncompliance with renal dialysis for other reason: Secondary | ICD-10-CM | POA: Diagnosis not present

## 2021-09-13 DIAGNOSIS — K56609 Unspecified intestinal obstruction, unspecified as to partial versus complete obstruction: Secondary | ICD-10-CM | POA: Diagnosis not present

## 2021-09-13 DIAGNOSIS — D57419 Sickle-cell thalassemia with crisis, unspecified: Secondary | ICD-10-CM | POA: Diagnosis not present

## 2021-09-13 DIAGNOSIS — R000 Tachycardia, unspecified: Secondary | ICD-10-CM | POA: Diagnosis not present

## 2021-09-13 LAB — CBC WITH DIFFERENTIAL/PLATELET
Abs Immature Granulocytes: 0.3 10*3/uL — ABNORMAL HIGH (ref 0.00–0.07)
Basophils Absolute: 0.1 10*3/uL (ref 0.0–0.1)
Basophils Relative: 1 %
Blasts: 1 %
Eosinophils Absolute: 0.4 10*3/uL (ref 0.0–0.5)
Eosinophils Relative: 4 %
HCT: 19.6 % — ABNORMAL LOW (ref 39.0–52.0)
Hemoglobin: 6.8 g/dL — CL (ref 13.0–17.0)
Lymphocytes Relative: 5 %
Lymphs Abs: 0.5 10*3/uL — ABNORMAL LOW (ref 0.7–4.0)
MCH: 34.7 pg — ABNORMAL HIGH (ref 26.0–34.0)
MCHC: 34.7 g/dL (ref 30.0–36.0)
MCV: 100 fL (ref 80.0–100.0)
Monocytes Absolute: 0.4 10*3/uL (ref 0.1–1.0)
Monocytes Relative: 4 %
Myelocytes: 3 %
Neutro Abs: 8.9 10*3/uL — ABNORMAL HIGH (ref 1.7–7.7)
Neutrophils Relative %: 82 %
Platelets: 152 10*3/uL (ref 150–400)
RBC: 1.96 MIL/uL — ABNORMAL LOW (ref 4.22–5.81)
RDW: 23.9 % — ABNORMAL HIGH (ref 11.5–15.5)
WBC: 10.8 10*3/uL — ABNORMAL HIGH (ref 4.0–10.5)
nRBC: 19.2 % — ABNORMAL HIGH (ref 0.0–0.2)
nRBC: 27 /100 WBC — ABNORMAL HIGH

## 2021-09-13 LAB — COMPREHENSIVE METABOLIC PANEL
ALT: 17 U/L (ref 0–44)
AST: 16 U/L (ref 15–41)
Albumin: 2.2 g/dL — ABNORMAL LOW (ref 3.5–5.0)
Alkaline Phosphatase: 197 U/L — ABNORMAL HIGH (ref 38–126)
Anion gap: 17 — ABNORMAL HIGH (ref 5–15)
BUN: 55 mg/dL — ABNORMAL HIGH (ref 8–23)
CO2: 20 mmol/L — ABNORMAL LOW (ref 22–32)
Calcium: 8.5 mg/dL — ABNORMAL LOW (ref 8.9–10.3)
Chloride: 98 mmol/L (ref 98–111)
Creatinine, Ser: 6.81 mg/dL — ABNORMAL HIGH (ref 0.61–1.24)
GFR, Estimated: 8 mL/min — ABNORMAL LOW (ref >60)
Glucose, Bld: 101 mg/dL — ABNORMAL HIGH (ref 70–99)
Potassium: 4.7 mmol/L (ref 3.5–5.1)
Sodium: 135 mmol/L (ref 135–145)
Total Bilirubin: 1.3 mg/dL — ABNORMAL HIGH (ref 0.3–1.2)
Total Protein: 7.5 g/dL (ref 6.5–8.1)

## 2021-09-13 LAB — RETICULOCYTES
Immature Retic Fract: 25.4 % — ABNORMAL HIGH (ref 2.3–15.9)
RBC.: 2.08 MIL/uL — ABNORMAL LOW (ref 4.22–5.81)
Retic Count, Absolute: 265.6 10*3/uL — ABNORMAL HIGH (ref 19.0–186.0)
Retic Ct Pct: 12.8 % — ABNORMAL HIGH (ref 0.4–3.1)

## 2021-09-13 LAB — TROPONIN I (HIGH SENSITIVITY)
Troponin I (High Sensitivity): 14 ng/L (ref <18)
Troponin I (High Sensitivity): 13 ng/L (ref <18)

## 2021-09-13 LAB — MAGNESIUM: Magnesium: 1.8 mg/dL (ref 1.7–2.4)

## 2021-09-13 LAB — PREPARE RBC (CROSSMATCH)

## 2021-09-13 MED ORDER — FOLIC ACID 1 MG PO TABS
1.0000 mg | ORAL_TABLET | Freq: Every day | ORAL | Status: DC
Start: 2021-09-13 — End: 2021-09-17
  Administered 2021-09-13 – 2021-09-17 (×5): 1 mg via ORAL
  Filled 2021-09-13 (×5): qty 1

## 2021-09-13 MED ORDER — ALLOPURINOL 100 MG PO TABS
100.0000 mg | ORAL_TABLET | Freq: Every day | ORAL | Status: DC
  Administered 2021-09-13 – 2021-09-17 (×5): 100 mg via ORAL
  Filled 2021-09-13 (×5): qty 1

## 2021-09-13 MED ORDER — HYDROMORPHONE HCL 1 MG/ML IJ SOLN
2.0000 mg | INTRAMUSCULAR | Status: AC
  Administered 2021-09-13: 2 mg via INTRAVENOUS
  Filled 2021-09-13: qty 2

## 2021-09-13 MED ORDER — METOPROLOL TARTRATE 12.5 MG HALF TABLET
12.5000 mg | ORAL_TABLET | Freq: Two times a day (BID) | ORAL | Status: DC
  Administered 2021-09-13 – 2021-09-17 (×8): 12.5 mg via ORAL
  Filled 2021-09-13 (×9): qty 1

## 2021-09-13 MED ORDER — FERRIC CITRATE 1 GM 210 MG(FE) PO TABS
210.0000 mg | ORAL_TABLET | Freq: Three times a day (TID) | ORAL | Status: DC
  Administered 2021-09-14 – 2021-09-17 (×9): 210 mg via ORAL
  Filled 2021-09-13 (×9): qty 1

## 2021-09-13 MED ORDER — HYDROMORPHONE HCL 1 MG/ML IJ SOLN
2.0000 mg | Freq: Once | INTRAMUSCULAR | Status: AC
  Administered 2021-09-13: 2 mg via INTRAVENOUS
  Filled 2021-09-13: qty 2

## 2021-09-13 MED ORDER — DOXERCALCIFEROL 4 MCG/2ML IV SOLN
6.0000 ug | INTRAVENOUS | Status: DC
  Administered 2021-09-14 – 2021-09-16 (×3): 6 ug via INTRAVENOUS
  Filled 2021-09-13 (×4): qty 4

## 2021-09-13 MED ORDER — SODIUM CHLORIDE 0.9% FLUSH: 9.0000 mL | INTRAVENOUS | Status: DC | PRN

## 2021-09-13 MED ORDER — HYDROXYUREA 500 MG PO CAPS
500.0000 mg | ORAL_CAPSULE | Freq: Every day | ORAL | Status: DC
  Administered 2021-09-14 – 2021-09-17 (×4): 500 mg via ORAL
  Filled 2021-09-13 (×4): qty 1

## 2021-09-13 MED ORDER — NALOXONE HCL 0.4 MG/ML IJ SOLN: 0.4000 mg | INTRAMUSCULAR | Status: DC | PRN

## 2021-09-13 MED ORDER — POLYETHYLENE GLYCOL 3350 17 G PO PACK: 17.0000 g | PACK | Freq: Every day | ORAL | Status: DC | PRN

## 2021-09-13 MED ORDER — CHLORHEXIDINE GLUCONATE CLOTH 2 % EX PADS
6.0000 | MEDICATED_PAD | Freq: Every day | CUTANEOUS | Status: DC
  Administered 2021-09-15 – 2021-09-17 (×3): 6 via TOPICAL

## 2021-09-13 MED ORDER — PANTOPRAZOLE SODIUM 40 MG PO TBEC
40.0000 mg | DELAYED_RELEASE_TABLET | Freq: Two times a day (BID) | ORAL | Status: DC
  Administered 2021-09-13 – 2021-09-17 (×8): 40 mg via ORAL
  Filled 2021-09-13 (×8): qty 1

## 2021-09-13 MED ORDER — PROSOURCE PLUS PO LIQD
30.0000 mL | Freq: Two times a day (BID) | ORAL | Status: DC
  Administered 2021-09-14 – 2021-09-17 (×5): 30 mL via ORAL
  Filled 2021-09-13 (×5): qty 30

## 2021-09-13 MED ORDER — HYDROMORPHONE 1 MG/ML IV SOLN
INTRAVENOUS | Status: DC
  Administered 2021-09-14: 2.8 mg via INTRAVENOUS
  Administered 2021-09-14: 1.2 mg via INTRAVENOUS
  Filled 2021-09-13 (×2): qty 30

## 2021-09-13 MED ORDER — DILTIAZEM HCL 25 MG/5ML IV SOLN
10.0000 mg | Freq: Once | INTRAVENOUS | Status: AC
  Administered 2021-09-13: 10 mg via INTRAVENOUS
  Filled 2021-09-13: qty 5

## 2021-09-13 MED ORDER — RENA-VITE PO TABS
1.0000 | ORAL_TABLET | Freq: Every day | ORAL | Status: DC
  Administered 2021-09-13 – 2021-09-16 (×4): 1 via ORAL
  Filled 2021-09-13 (×4): qty 1

## 2021-09-13 MED ORDER — SODIUM CHLORIDE 0.9 % IV SOLN
10.0000 mL/h | Freq: Once | INTRAVENOUS | Status: AC
Start: 2021-09-13 — End: 2021-09-13
  Administered 2021-09-13: 10 mL/h via INTRAVENOUS

## 2021-09-13 MED ORDER — CINACALCET HCL 30 MG PO TABS
30.0000 mg | ORAL_TABLET | ORAL | Status: DC
  Administered 2021-09-14 – 2021-09-16 (×2): 30 mg via ORAL
  Filled 2021-09-13 (×2): qty 1

## 2021-09-13 MED ORDER — DIPHENHYDRAMINE HCL 25 MG PO CAPS: 25.0000 mg | ORAL_CAPSULE | ORAL | Status: DC | PRN

## 2021-09-13 NOTE — Assessment & Plan Note (Addendum)
--   Appears euvolemic.  Last echo: 12/22 with EF of 40-45% and decreased LVF ?--volume control per dialysis  ?-Started back beta blocker, but has been non compliant  ?

## 2021-09-13 NOTE — Assessment & Plan Note (Addendum)
--  No compliant with beta blocker and blood pressures have been well controlled ?--On metoprolol for rate control. ?

## 2021-09-13 NOTE — Assessment & Plan Note (Addendum)
--  ESRD on dialysis TTS with last session on Saturday, missed Tuesday. ?--Management per nephrology. ?

## 2021-09-13 NOTE — Progress Notes (Addendum)
?   09/13/21 2126  ?Vitals  ?Temp 97.7 ?F (36.5 ?C)  ?Temp Source Oral  ?BP 118/77  ?MAP (mmHg) 88  ?BP Location Left Arm  ?BP Method Automatic  ?Patient Position (if appropriate) Lying  ?Pulse Rate Source Monitor  ?ECG Heart Rate 100  ?Level of Consciousness  ?Level of Consciousness Alert  ?MEWS COLOR  ?MEWS Score Color Green  ?Oxygen Therapy  ?O2 Device Room Air  ?MEWS Score  ?MEWS Temp 0  ?MEWS Systolic 0  ?MEWS Pulse 0  ?MEWS RR 0  ?MEWS LOC 0  ?MEWS Score 0  ? ?Pt transported to unit from ED. Pt alert and oriented. Pt denies pain. CHG bath given. CCMD called. Pt oriented to unit. Call light by pt. ?

## 2021-09-13 NOTE — Assessment & Plan Note (Signed)
>>  ASSESSMENT AND PLAN FOR ACUTE ON CHRONIC ANEMIA WRITTEN ON 09/14/2021  3:25 PM BY GOODRICH, Melton Alar, MD  -- Secondary to sickle cell anemia.  Baseline hgb of 7-8.  Improved status post 1 unit PRBC.  PMH Hx of upper GI bleed due to AVM, last in 2/23

## 2021-09-13 NOTE — Assessment & Plan Note (Addendum)
--   Borderline prolonged.  Monitor. ?

## 2021-09-13 NOTE — Consult Note (Signed)
ESRD Consult Note ? ?Requesting provider: Orma Flaming ?Service requesting consult: Hospitalist ?Reason for consult: ESRD, provision of dialysis ?Indication for acute dialysis?: End Stage Renal Disease ? ?Outpatient dialysis unit: [facility] ?Outpatient dialysis prescription: ?TTS, F180, BFR 400, DFR 500, K 3, Ca 2.5, duration 4hrs, EDW 52.5kg, mircera 225 (last given 3/23), hectorol 105mcg each treatment, sensipar 30mg  each treatment, AVF right ? ?Assessment/Recommendations: Patrick [name] is a/an 66 y.o. male with a past medical history notable for ESRD on HD admitted with sickle cell pain crisis.  ? ?# ESRD: Missed dialysis on Tuesday.  Appears stable this evening.  Plan for dialysis tomorrow. Hep b labs up to date ?# Volume/ hypertension: Blood pressure acceptable.  Achieve dry weight as tolerated ?# Anemia of Chronic Kidney Disease: Complicated by sickle cell disease.  Would be due for Mircera again on 4/6.  Transfusions and management of sickle cell per primary/hematology ?# Secondary Hyperparathyroidism/Hyperphosphatemia: Continue home Auryxia, Sensipar and Hectorol with dialysis ?# Vascular access: AVF functioning well ?# Sickle cell pain crisis: Management per primary team ? ?# Additional recommendations: ?- Dose all meds for creatinine clearance < 10 ml/min  ?- Unless absolutely necessary, no MRIs with gadolinium.  ?- Implement save arm precautions.  Prefer needle sticks in the dorsum of the hands or wrists.  No blood pressure measurements in arm. ?- If blood transfusion is requested during hemodialysis sessions, please alert [place] prior to the session.  ?- Use synthetic opioids (Fentanyl/Dilaudid) if needed ? ?Recommendations were discussed with the primary team. ? ? ?History of Present Illness: Patrick [name] is a/an 66 y.o. male with a past medical history of ESRD who presents with pain in his legs. ? ?Patient was recently hospitalized for an SBO.  Left a couple weeks ago.  He states that he was  having some shortness of breath over the weekend but this progressed to be more of bilateral lower leg pain that came up into his back.  He says this pain is typical of his crisis.  He has not had fevers, chills, cough, nausea, vomiting.  He has felt slightly dizzy at times.  He last had dialysis on Saturday but missed on Tuesday because of the pain.  Denies any recent issues with dialysis. ? ?Medications:  ?Current Facility-Administered Medications  ?Medication Dose Route Frequency Provider Last Rate Last Admin  ? [START ON 09/14/2021] (feeding supplement) PROSource Plus liquid 30 mL  30 mL Oral BID BM Orma Flaming, MD      ? allopurinol (ZYLOPRIM) tablet 100 mg  100 mg Oral Daily Orma Flaming, MD      ? Derrill Memo ON 09/14/2021] cinacalcet (SENSIPAR) tablet 30 mg  30 mg Oral Q T,Th,Sa-HD Orma Flaming, MD      ? diphenhydrAMINE (BENADRYL) capsule 25 mg  25 mg Oral Q4H PRN Orma Flaming, MD      ? folic acid (FOLVITE) tablet 1 mg  1 mg Oral Daily Orma Flaming, MD      ? HYDROmorphone (DILAUDID) 1 mg/mL PCA injection   Intravenous Q4H Orma Flaming, MD      ? Derrill Memo ON 09/14/2021] hydroxyurea (HYDREA) capsule 500 mg  500 mg Oral QAC breakfast Orma Flaming, MD      ? metoprolol tartrate (LOPRESSOR) tablet 12.5 mg  12.5 mg Oral BID Orma Flaming, MD   12.5 mg at 09/13/21 1901  ? multivitamin (RENA-VIT) tablet 1 tablet  1 tablet Oral QHS Orma Flaming, MD      ? naloxone [facility]) injection 0.4 mg  0.4 mg Intravenous PRN Orma Flaming, MD      ? And  ? sodium chloride flush (NS) 0.9 % injection 9 mL  9 mL Intravenous PRN Orma Flaming, MD      ? pantoprazole (PROTONIX) EC tablet 40 mg  40 mg Oral BID Orma Flaming, MD      ? polyethylene glycol (MIRALAX / GLYCOLAX) packet 17 g  17 g Oral Daily PRN Orma Flaming, MD      ? ?Current Outpatient Medications  ?Medication Sig Dispense Refill  ? acetaminophen (TYLENOL) 500 MG tablet Take 500 mg by mouth every 6 (six) hours as needed for moderate pain.    ?  allopurinol (ZYLOPRIM) 100 MG tablet TAKE 1 TABLET BY MOUTH EVERY DAY (Patient taking differently: Take 100 mg by mouth daily.) 30 tablet 0  ? fluticasone (FLONASE) 50 MCG/ACT nasal spray Place 2 sprays into both nostrils daily. 16 g 6  ? folic acid (FOLVITE) 1 MG tablet TAKE 1 TABLET BY MOUTH EVERY DAY (Patient taking differently: Take 1 mg by mouth daily.) 90 tablet 3  ? hydroxyurea (HYDREA) 500 MG capsule Take 1 capsule (500 mg total) by mouth daily. TAKE 1 CAPSULE BY MOUTH EVERY DAY WITH FOOD TO MINIMIZE GI SIDE EFFECTS (Patient taking differently: Take 500 mg by mouth daily before breakfast.) 90 capsule 1  ? lidocaine-prilocaine (EMLA) cream Apply 1 application. topically Every Tuesday,Thursday,and Saturday with dialysis.    ? Nutritional Supplements (,FEEDING SUPPLEMENT, PROSOURCE PLUS) liquid Take 30 mLs by mouth 2 (two) times daily between meals.    ? oxycodone (OXY-IR) 5 MG capsule Take 2 capsules (10 mg total) by mouth every 6 (six) hours as needed for pain. 60 capsule 0  ? pantoprazole (PROTONIX) 40 MG tablet TAKE 1 TABLET BY MOUTH TWICE A DAY 30 tablet 3  ? tiZANidine (ZANAFLEX) 4 MG tablet Take 0.5 tablets (2 mg total) by mouth every 8 (eight) hours as needed for muscle spasms. 15 tablet 1  ? torsemide (DEMADEX) 20 MG tablet Take 80 mg by mouth daily.    ? traMADol (ULTRAM) 50 MG tablet Take 50 mg by mouth 2 (two) times daily as needed.    ? Vitamin D, Ergocalciferol, (DRISDOL) 1.25 MG (50000 UNIT) CAPS capsule TAKE 1 CAPSULE BY MOUTH ONE TIME PER WEEK (Patient taking differently: Take 50,000 Units by mouth once a week. Monday) 12 capsule 1  ? AURYXIA 1 GM 210 MG(Fe) tablet Take 210 mg by mouth 3 (three) times daily.    ? cinacalcet (SENSIPAR) 30 MG tablet Take 1 tablet (30 mg total) by mouth Every Tuesday,Thursday,and Saturday with dialysis. (Patient not taking: Reported on 09/13/2021) 36 tablet 0  ? Glycerin-Hypromellose-PEG 400 (VISINE DRY EYE OP) Place 1 drop into both eyes as needed (dryness).  (Patient not taking: Reported on 09/13/2021)    ? multivitamin (RENA-VIT) TABS tablet Take 1 tablet by mouth at bedtime. (Patient not taking: Reported on 09/13/2021) 90 tablet 0  ?  ? ?ALLERGIES ?Patient has no known allergies. ? ?MEDICAL HISTORY ?Past Medical History:  ?Diagnosis Date  ? Blood dyscrasia   ? Blood transfusion   ? "I've had a bunch of them"  ? CHF (congestive heart failure) ([place])   ? followed by hf clinic  ? Chronic kidney disease (CKD), stage III (moderate) ([facility])   ? Dialysis patient [facility]) 04/2019  ? AV fistula (right arm)  ? Gout   ? Headache(784.0)   ? "probably weekly" (01/13/2014)  ? Legally blind   ? Shortness  of breath dyspnea   ? Sickle cell disease, type SS ([place])   ? Swelling abdomen   ? Swelling of extremity, left   ? Swelling of extremity, right   ?  ? ?SOCIAL HISTORY ?Social History  ? ?Socioeconomic History  ? Marital status: Divorced  ?  Spouse name: Not on file  ? Number of children: Not on file  ? Years of education: Not on file  ? Highest education level: Not on file  ?Occupational History  ? Occupation: disabled  ?Tobacco Use  ? Smoking status: Former  ?  Packs/day: 0.50  ?  Years: 41.00  ?  Pack years: 20.50  ?  Types: Cigarettes  ?  Quit date: 12/08/2015  ?  Years since quitting: 5.7  ? Smokeless tobacco: Never  ?Vaping Use  ? Vaping Use: Never used  ?Substance and Sexual Activity  ? Alcohol use: Not Currently  ?  Alcohol/week: 3.0 standard drinks  ?  Types: 1 Glasses of wine, 2 Cans of beer per week  ?  Comment: occasional  ? Drug use: No  ? Sexual activity: Not Currently  ?Other Topics Concern  ? Not on file  ?Social History Narrative  ? Not on file  ? ?Social Determinants of Health  ? ?Financial Resource Strain: Low Risk   ? Difficulty of Paying Living Expenses: Not very hard  ?Food Insecurity: No Food Insecurity  ? Worried About Charity fundraiser in the Last Year: Never true  ? Ran Out of Food in the Last Year: Never true  ?Transportation Needs: No Transportation Needs  ? Lack  of Transportation (Medical): No  ? Lack of Transportation (Non-Medical): No  ?Physical Activity: Not on file  ?Stress: Not on file  ?Social Connections: Not on file  ?Intimate Partner Violence: Not on file  ?  ? ?FAMI

## 2021-09-13 NOTE — Assessment & Plan Note (Addendum)
--   pain remains uncontrolled, but typical for his usual crisis.  No shortness of breath.  Will confer with sickle cell specialist and adjust analgesia. ?--Hemoglobin increased status post transfusion.  Monitor blood counts.  We will continue hydroxyurea and folic acid. ?

## 2021-09-13 NOTE — ED Triage Notes (Signed)
[facility] reports pt coming from home c/o weakness for the past few days and leg and back pain. Pt missed dialysis yesterday. Pt w/hx of Afib and showing Afib with EMS. ?

## 2021-09-13 NOTE — ED Provider Notes (Signed)
?[facility] ?Provider Note ? ? ?CSN: [id number] ?Arrival date & time: 09/13/21  1246 ? ?  ? ?History ? ?Chief Complaint  ?Patient presents with  ? Weakness  ? ? ?Patrick [name] is a 66 y.o. male with PMHx sickle cell disease, ESRD on dialysis T Th S, CHF with EF 40-45%, A fib (not anticoagulated s/2 recurrent GI bleeds) who presents to the ED today via EMS with complaint of gradual onset, constant, sharp, lower back pain and bilateral leg pain x 2 days. Pt reports this feels like his typical sickle cell crisis. He admits he has been out of his pain medication for a couple of days. He also complains of feeling weak with mild SOB. He is found to be in A fib with RVR today with a heart rate 149. Per chart review pt with chronic A fib history. EMS reports pt missed dialysis yesterday as well. Denies chest pain.  ? ?The history is provided by the patient and medical records.  ? ?  ? ?Home Medications ?Prior to Admission medications   ?Medication Sig Start Date End Date Taking? Authorizing Provider  ?acetaminophen (TYLENOL) 500 MG tablet Take 500 mg by mouth every 6 (six) hours as needed for moderate pain.   Yes [provider]  ?allopurinol (ZYLOPRIM) 100 MG tablet TAKE 1 TABLET BY MOUTH EVERY DAY ?Patient taking differently: Take 100 mg by mouth daily. 07/13/21  Yes Dorena Dew, FNP  ?fluticasone (FLONASE) 50 MCG/ACT nasal spray Place 2 sprays into both nostrils daily. 09/04/21  Yes Passmore, Jake Church I, NP  ?folic acid (FOLVITE) 1 MG tablet TAKE 1 TABLET BY MOUTH EVERY DAY ?Patient taking differently: Take 1 mg by mouth daily. 02/22/21  Yes Dorena Dew, FNP  ?hydroxyurea (HYDREA) 500 MG capsule Take 1 capsule (500 mg total) by mouth daily. TAKE 1 CAPSULE BY MOUTH EVERY DAY WITH FOOD TO MINIMIZE GI SIDE EFFECTS ?Patient taking differently: Take 500 mg by mouth daily before breakfast. 01/31/21  Yes Dorena Dew, FNP  ?lidocaine-prilocaine (EMLA) cream Apply 1  application. topically Every Tuesday,Thursday,and Saturday with dialysis. 09/08/19  Yes [provider]  ?Nutritional Supplements (,FEEDING SUPPLEMENT, PROSOURCE PLUS) liquid Take 30 mLs by mouth 2 (two) times daily between meals. 08/30/21  Yes Dahal, Marlowe Aschoff, MD  ?oxycodone (OXY-IR) 5 MG capsule Take 2 capsules (10 mg total) by mouth every 6 (six) hours as needed for pain. 08/28/21  Yes Dorena Dew, FNP  ?pantoprazole (PROTONIX) 40 MG tablet TAKE 1 TABLET BY MOUTH TWICE A DAY 08/21/21  Yes Willia Craze, NP  ?tiZANidine (ZANAFLEX) 4 MG tablet Take 0.5 tablets (2 mg total) by mouth every 8 (eight) hours as needed for muscle spasms. 07/18/21  Yes Dorena Dew, FNP  ?torsemide (DEMADEX) 20 MG tablet Take 80 mg by mouth daily. 09/03/21  Yes [provider]  ?traMADol (ULTRAM) 50 MG tablet Take 50 mg by mouth 2 (two) times daily as needed. 09/05/21  Yes [provider]  ?Vitamin D, Ergocalciferol, (DRISDOL) 1.25 MG (50000 UNIT) CAPS capsule TAKE 1 CAPSULE BY MOUTH ONE TIME PER WEEK ?Patient taking differently: Take 50,000 Units by mouth once a week. Monday 05/15/21  Yes Dorena Dew, FNP  ?AURYXIA 1 GM 210 MG(Fe) tablet Take 210 mg by mouth 3 (three) times daily. 02/11/21   [provider]  ?cinacalcet (SENSIPAR) 30 MG tablet Take 1 tablet (30 mg total) by mouth Every Tuesday,Thursday,and Saturday with dialysis. ?Patient not taking: Reported  on 09/13/2021 08/31/21 11/29/21  Terrilee Croak, MD  ?Glycerin-Hypromellose-PEG 400 (VISINE DRY EYE OP) Place 1 drop into both eyes as needed (dryness). ?Patient not taking: Reported on 09/13/2021    [provider]  ?multivitamin (RENA-VIT) TABS tablet Take 1 tablet by mouth at bedtime. ?Patient not taking: Reported on 09/13/2021 08/30/21 11/28/21  Terrilee Croak, MD  ?   ? ?Allergies    ?Patient has no known allergies.   ? ?Review of Systems   ?Review of Systems  ?Constitutional:  Positive for chills and fatigue. Negative for fever.   ?Respiratory:  Positive for shortness of breath.   ?Cardiovascular:  Negative for chest pain and palpitations.  ?Gastrointestinal:  Negative for nausea and vomiting.  ?Musculoskeletal:  Positive for arthralgias and back pain.  ?All other systems reviewed and are negative. ? ?Physical Exam ?Updated Vital Signs ?BP 110/71   Pulse (!) 105   Temp (!) 97.5 ?F (36.4 ?C) (Oral)   Resp 14   Ht 5\' 11"  (1.803 m)   Wt 66.2 kg   SpO2 97%   BMI 20.36 kg/m?  ?Physical Exam ?Vitals and nursing note reviewed.  ?Constitutional:   ?   Appearance: He is not ill-appearing or diaphoretic.  ?HENT:  ?   Head: Normocephalic and atraumatic.  ?Eyes:  ?   Conjunctiva/sclera: Conjunctivae normal.  ?Cardiovascular:  ?   Rate and Rhythm: Tachycardia present. Rhythm irregularly irregular.  ?   Pulses: Normal pulses.  ?Pulmonary:  ?   Effort: Pulmonary effort is normal.  ?   Breath sounds: Normal breath sounds. No wheezing, rhonchi or rales.  ?Abdominal:  ?   Palpations: Abdomen is soft.  ?   Comments: Large midline surgical scar to abdomen  ?Musculoskeletal:     ?   General: Tenderness present.  ?   Cervical back: Neck supple.  ?   Comments: + diffuse lumbar paraspinous musculature TTP and BLE TTP  ?Skin: ?   General: Skin is warm and dry.  ?Neurological:  ?   Mental Status: He is alert.  ? ? ?ED Results / Procedures / Treatments   ?Labs ?(all labs ordered are listed, but only abnormal results are displayed) ?Labs Reviewed  ?CBC WITH DIFFERENTIAL/PLATELET  ?RETICULOCYTES  ?COMPREHENSIVE METABOLIC PANEL  ?MAGNESIUM  ?TROPONIN I (HIGH SENSITIVITY)  ?TROPONIN I (HIGH SENSITIVITY)  ? ? ?EKG ?None ? ?Radiology ?DG Chest Port 1 View ? ?Result Date: 09/13/2021 ?CLINICAL DATA:  Shortness of breath. EXAM: PORTABLE CHEST 1 VIEW COMPARISON:  August 24, 2021. FINDINGS: Stable cardiomegaly with mild central pulmonary vascular congestion. Mild bibasilar atelectasis or edema are noted. Bony thorax is unremarkable. IMPRESSION: Mild bibasilar subsegmental  atelectasis or edema. Electronically Signed   By: Marijo Conception M.D.   On: 09/13/2021 14:12   ? ?Procedures ?Procedures  ? ? ?Medications Ordered in ED ?Medications  ?HYDROmorphone (DILAUDID) injection 2 mg (has no administration in time range)  ?diltiazem (CARDIZEM) injection 10 mg (10 mg Intravenous Given 09/13/21 1352)  ?HYDROmorphone (DILAUDID) injection 2 mg (2 mg Intravenous Given 09/13/21 1352)  ?HYDROmorphone (DILAUDID) injection 2 mg (2 mg Intravenous Given 09/13/21 1450)  ? ? ?ED Course/ Medical Decision Making/ A&P ?  ?                        ?Medical Decision Making ?66 year old male who presents to the ED today with multiple complaints.  Complaining of pain in his back and his legs, reports consistent with his sickle  cell crisis pain.  Does not have pain medication at home currently.  He is also found to be in A-fib with RVR with a heart rate of 149 on arrival.  Patient with persistent history of A-fib however not anticoagulated at this time due to history of recurrent GI bleeding.  Does not appear to be on rate control medication as well.  He reports some shortness of breath however missed dialysis yesterday is unsure if this is attributed to same. ? ?On arrival his vitals are stable.  We will plan to work-up for sickle cell crisis, A-fib with RVR, sent in for fluid overload after missing dialysis yesterday.  Will provide 10 mg Cardizem IV and reevaluate. ? ?On reevaluation after IV Cardizem patient's heart rate around 10 1-1 02 which appears to be more around patient's baseline.  Lab work pending at this time.  ? ?Amount and/or Complexity of Data Reviewed ?Labs: ordered. ?Radiology: ordered. ? ?Risk ?Prescription drug management. ? ? ?At shift change case signed out to [employer], PA-C, who will dispo patient accordingly after labs and pain meds. Concern for possible hyperkalemia given missed dialysis vs anemia. May require admission depending on blood work.  ? ? ? ? ? ? ? ?Final Clinical  Impression(s) / ED Diagnoses ?Final diagnoses:  ?None  ? ? ?Rx / DC Orders ?ED Discharge Orders   ? ? None  ? ?  ? ? ?  ?Eustaquio Maize, PA-C ?09/13/21 1506 ? ?  ?Lianne Cure, DO ?03/55/97 0715 ? ?

## 2021-09-13 NOTE — ED Notes (Signed)
ED TO INPATIENT HANDOFF REPORT ? ?ED Nurse Name and Phone #: Shirlee Limerick [phone] ? ?S ?Name/Age/Gender ?Patrick [name] ?66 y.o. ?male ?Room/Bed: 024C/024C ? ?Code Status ?  Code Status: Full Code ? ?Home/SNF/Other ?Home ?Patient oriented to: self, place, time, and situation ?Is this baseline? Yes  ? ?Triage Complete: Triage complete  ?Chief Complaint ?Sickle cell crisis ([place]) [D57.00] ? ?Triage Note ?[facility] reports pt coming from home c/o weakness for the past few days and leg and back pain. Pt missed dialysis yesterday. Pt w/hx of Afib and showing Afib with EMS.  ? ?Allergies ?No Known Allergies ? ?Level of Care/Admitting Diagnosis ?ED Disposition   ? ? ED Disposition  ?Admit  ? Condition  ?--  ? Comment  ?Hospital Area: [facility] [[id number]] ? Level of Care: Progressive [102] ? Admit to Progressive based on following criteria: MULTISYSTEM THREATS such as stable sepsis, metabolic/electrolyte imbalance with or without encephalopathy that is responding to early treatment. ? May admit patient to [facility] or [facility] if equivalent level of care is available:: No ? Covid Evaluation: Asymptomatic - no recent exposure (last 10 days) testing not required ? Diagnosis: Sickle cell crisis ([place]) [818299] ? Admitting Physician: Orma Flaming [[id number]] ? Attending Physician: Orma Flaming [[id number]] ? Estimated length of stay: past midnight tomorrow ? Certification:: I certify this patient will need inpatient services for at least 2 midnights ?  ?  ? ?  ? ? ?B ?Medical/Surgery History ?Past Medical History:  ?Diagnosis Date  ? Blood dyscrasia   ? Blood transfusion   ? "I've had a bunch of them"  ? CHF (congestive heart failure) ([place])   ? followed by hf clinic  ? Chronic kidney disease (CKD), stage III (moderate) (HCC)   ? Dialysis patient [facility]) 04/2019  ? AV fistula (right arm)  ? Gout   ? Headache(784.0)   ? "probably weekly" (01/13/2014)  ? Legally blind   ? Shortness of breath dyspnea   ? Sickle cell  disease, type SS ([place])   ? Swelling abdomen   ? Swelling of extremity, left   ? Swelling of extremity, right   ? ?Past Surgical History:  ?Procedure Laterality Date  ? A/V FISTULAGRAM Right 07/29/2020  ? Procedure: A/V FISTULAGRAM - Right Arm;  Surgeon: Angelia Mould, MD;  Location: [place] CV LAB;  Service: Cardiovascular;  Laterality: Right;  ? BASCILIC VEIN TRANSPOSITION Right 04/12/2014  ? Procedure: BASILIC VEIN TRANSPOSITION;  Surgeon: Elam Dutch, MD;  Location: [place];  Service: Vascular;  Laterality: Right;  ? BIOPSY  08/04/2020  ? Procedure: BIOPSY;  Surgeon: Ladene Artist, MD;  Location: [place];  Service: Gastroenterology;;  ? BIOPSY  06/28/2021  ? Procedure: BIOPSY;  Surgeon: Jerene Bears, MD;  Location: [place];  Service: Gastroenterology;;  ? BOWEL RESECTION N/A 05/24/2021  ? Procedure: SMALL BOWEL RESECTION;  Surgeon: Rolm Bookbinder, MD;  Location: [place];  Service: General;  Laterality: N/A;  ? CARDIAC CATHETERIZATION  05/2011  ? CARDIOVERSION N/A 06/05/2018  ? Procedure: CARDIOVERSION;  Surgeon: Jolaine Artist, MD;  Location: [place];  Service: Cardiovascular;  Laterality: N/A;  ? CARDIOVERSION N/A 10/06/2019  ? Procedure: CARDIOVERSION;  Surgeon: Jolaine Artist, MD;  Location: [place];  Service: Cardiovascular;  Laterality: N/A;  ? CHOLECYSTECTOMY N/A 05/24/2021  ? Procedure: OPEN CHOLECYSTECTOMY;  Surgeon: Rolm Bookbinder, MD;  Location: [place];  Service: General;  Laterality: N/A;  ? COLONOSCOPY WITH PROPOFOL N/A 09/24/2017  ? Procedure: COLONOSCOPY  WITH PROPOFOL;  Surgeon: Jackquline Denmark, MD;  Location: [facility] ENDOSCOPY;  Service: Endoscopy;  Laterality: N/A;  ? ESOPHAGOGASTRODUODENOSCOPY N/A 09/19/2017  ? Procedure: ESOPHAGOGASTRODUODENOSCOPY (EGD);  Surgeon: Jackquline Denmark, MD;  Location: [facility] ENDOSCOPY;  Service: Endoscopy;  Laterality: N/A;  ? ESOPHAGOGASTRODUODENOSCOPY N/A 05/29/2021  ? Procedure: ESOPHAGOGASTRODUODENOSCOPY (EGD);  Surgeon: Milus Banister, MD;  Location: [facility] ENDOSCOPY;  Service: Gastroenterology;  Laterality: N/A;  ? ESOPHAGOGASTRODUODENOSCOPY (EGD) WITH PROPOFOL N/A 08/04/2020  ? Procedure: ESOPHAGOGASTRODUODENOSCOPY (EGD) WITH PROPOFOL;  Surgeon: Ladene Artist, MD;  Location: [place];  Service: Gastroenterology;  Laterality: N/A;  ? ESOPHAGOGASTRODUODENOSCOPY (EGD) WITH PROPOFOL N/A 06/28/2021  ? Procedure: ESOPHAGOGASTRODUODENOSCOPY (EGD) WITH PROPOFOL;  Surgeon: Jerene Bears, MD;  Location: [facility] ENDOSCOPY;  Service: Gastroenterology;  Laterality: N/A;  ? ESOPHAGOGASTRODUODENOSCOPY (EGD) WITH PROPOFOL N/A 08/02/2021  ? Procedure: ESOPHAGOGASTRODUODENOSCOPY (EGD) WITH PROPOFOL;  Surgeon: Lavena Bullion, DO;  Location: [place] ENDOSCOPY;  Service: Gastroenterology;  Laterality: N/A;  ? ESOPHAGOGASTRODUODENOSCOPY (EGD) WITH PROPOFOL N/A 08/10/2021  ? Procedure: ESOPHAGOGASTRODUODENOSCOPY (EGD) WITH PROPOFOL;  Surgeon: Jackquline Denmark, MD;  Location: [place];  Service: Gastroenterology;  Laterality: N/A;  ? HEMOSTASIS CLIP PLACEMENT  05/29/2021  ? Procedure: HEMOSTASIS CLIP PLACEMENT;  Surgeon: Milus Banister, MD;  Location: [place];  Service: Gastroenterology;;  ? HOT HEMOSTASIS N/A 05/29/2021  ? Procedure: HOT HEMOSTASIS (ARGON PLASMA COAGULATION/BICAP);  Surgeon: Milus Banister, MD;  Location: [facility] ENDOSCOPY;  Service: Gastroenterology;  Laterality: N/A;  ? HOT HEMOSTASIS N/A 06/28/2021  ? Procedure: HOT HEMOSTASIS (ARGON PLASMA COAGULATION/BICAP);  Surgeon: Jerene Bears, MD;  Location: [facility] ENDOSCOPY;  Service: Gastroenterology;  Laterality: N/A;  ? HOT HEMOSTASIS N/A 08/02/2021  ? Procedure: HOT HEMOSTASIS (ARGON PLASMA COAGULATION/BICAP);  Surgeon: Lavena Bullion, DO;  Location: [facility] ENDOSCOPY;  Service: Gastroenterology;  Laterality: N/A;  ? HOT HEMOSTASIS N/A 08/10/2021  ? Procedure: HOT HEMOSTASIS (ARGON PLASMA COAGULATION/BICAP);  Surgeon: Jackquline Denmark, MD;  Location: [facility] ENDOSCOPY;  Service: Gastroenterology;  Laterality:  N/A;  ? IR PERC TUN PERIT CATH WO PORT S&I /IMAG  05/23/2021  ? IR REMOVAL TUN CV CATH W/O FL  06/10/2021  ? IR US GUIDE VASC ACCESS RIGHT  05/23/2021  ? LAPAROSCOPY N/A 05/24/2021  ? Procedure: LAPAROSCOPY DIAGNOSTIC;  Surgeon: Rolm Bookbinder, MD;  Location: [place];  Service: General;  Laterality: N/A;  ? LAPAROTOMY N/A 05/24/2021  ? Procedure: EXPLORATORY LAPAROTOMY;  Surgeon: Rolm Bookbinder, MD;  Location: [place];  Service: General;  Laterality: N/A;  ? LEFT AND RIGHT HEART CATHETERIZATION WITH CORONARY ANGIOGRAM N/A 06/11/2011  ? Procedure: LEFT AND RIGHT HEART CATHETERIZATION WITH CORONARY ANGIOGRAM;  Surgeon: Larey Dresser, MD;  Location: [facility] CATH LAB;  Service: Cardiovascular;  Laterality: N/A;  ? PERIPHERAL VASCULAR BALLOON ANGIOPLASTY Right 07/29/2020  ? Procedure: PERIPHERAL VASCULAR BALLOON ANGIOPLASTY;  Surgeon: Angelia Mould, MD;  Location: [place] CV LAB;  Service: Cardiovascular;  Laterality: Right;  arm fistula  ? REVISON OF ARTERIOVENOUS FISTULA Right 08/08/2020  ? Procedure: ANEURYSM EXCISION OF RIGHT UPPER EXTREMITY ARTERIOVENOUS FISTULA;  Surgeon: Angelia Mould, MD;  Location: [place];  Service: Vascular;  Laterality: Right;  ? SCLEROTHERAPY  05/29/2021  ? Procedure: SCLEROTHERAPY;  Surgeon: Milus Banister, MD;  Location: [facility] ENDOSCOPY;  Service: Gastroenterology;;  ? [place]  05/29/2021  ? Procedure: STENT REMOVAL;  Surgeon: Milus Banister, MD;  Location: [facility] ENDOSCOPY;  Service: Gastroenterology;;  ? TEE WITHOUT CARDIOVERSION N/A 12/27/2015  ? Procedure: TRANSESOPHAGEAL ECHOCARDIOGRAM (TEE);  Surgeon: Sueanne Margarita, MD;  Location:  [facility] ENDOSCOPY;  Service: Cardiovascular;  Laterality: N/A;  ?  ? ?A ?IV Location/Drains/Wounds ?Patient Lines/Drains/Airways Status   ? ? Active Line/Drains/Airways   ? ? Name Placement date Placement time Site Days  ? Peripheral IV 09/13/21 20 G 1" Left Antecubital 09/13/21  1306  Antecubital  less than 1  ? Peripheral IV 09/13/21 20 G  Anterior;Left Forearm 09/13/21  1724  Forearm  less than 1  ? Fistula / Graft Right Upper arm --  --  Upper arm  --  ? Fistula / Graft Right Upper arm Arteriovenous fistula 08/01/21  0354  Upper arm  43  ? Incis

## 2021-09-13 NOTE — Assessment & Plan Note (Addendum)
--   Secondary to sickle cell anemia.  Baseline hgb of 7-8.  Improved status post 1 unit PRBC.  PMH Hx of upper GI bleed due to AVM, last in 2/23 ? ?

## 2021-09-13 NOTE — H&P (Signed)
?History and Physical  ? ? ?Patient: Patrick [name] ZOX:[id number] DOB: Mar 08, 1956 ?DOA: 09/13/2021 ?DOS: the patient was seen and examined on 09/13/2021 ?PCP: [name], MD  ?Patient coming from: Home - lives alone.  ? ? ?Chief Complaint: shortness of breath, fatigue, lower leg pain  ? ?HPI: Patrick [name] is a 67 y.o. male with medical history significant of  ESRD HD TTS, CHF, sickle cell disease, permanent A-fib not on anticoagulation, upper GI bleed due to AVMs, COPD, laparotomy/cholecystectomy/ischemic small bowel resection in 12/22 and recent hospitalization from SBO that resolved with NGT and pelvic abscess that was treated with drain placement and antibiotics. He states he started to have shortness of breath on Sunday and then Monday he had bilateral leg pain and low back pain. He was supposed to have dialysis on Tuesday but he was too tired to go. He denies any fever/chills or recent illness. He has had some dizziness going from sitting to standing. Denies any blood in stool or hematemesis.  ? ?Recently admitted on 3/7-3/15 for severe abdominal pain and high grade SBO. Treated conservatively.  ? ? ?He has been feeling good. Denies any fever/chills, vision changes/headaches, chest pain or palpitations, +SOB, no abdominal pain, N/V/D, dysuria or leg swelling.  ? ?He does not smoke or drink.  ? ? ?ER Course:  vitals: temp: 97.5, bp: 118/75, HR: 149, RR: 22, oxygen: 96%RA ?Pertinent labs: BUN: 55, creatinine: 6.81, alk phos: 197, wbc: 10.8, hgb: 6.8,  ?CXR: mild bibasilar subsegmental atelectasis or edema  ?In ED 2 units of RBC ordered. Given 72m dilaudid total. 18mcardizem.  ?Nephrology consulted.  ? ? ?Review of Systems: As mentioned in the history of present illness. All other systems reviewed and are negative. ?Past Medical History:  ?Diagnosis Date  ? Blood dyscrasia   ? Blood transfusion   ? "I've had a bunch of them"  ? CHF (congestive heart failure) ([place]CClyde  ? followed by hf clinic  ?  Chronic kidney disease (CKD), stage III (moderate) (HCC)   [facility]  ? AV fistula (right arm)  ? Gout   ? Headache(784.0)   ? "probably weekly" (01/13/2014)  ? Legally blind   ? Shortness of breath dyspnea   ? Sickle cell disease, type SS ([place]  ? Swelling abdomen   ? Swelling of extremity, left   ? Swelling of extremity, right   ? ?Past Surgical History:  ?Procedure Laterality Date  ? A/V FISTULAGRAM Right 07/29/2020  ? Procedure: A/V FISTULAGRAM - Right Arm;  Surgeon: DiAngelia MouldMD;  [facility];  Service: Cardiovascular;  Laterality: Right;  ? BASCILIC VEIN TRANSPOSITION Right 04/12/2014  ? Procedure: BASILIC VEIN TRANSPOSITION;  Surgeon: ChElam DutchMD;  [facility] Service: Vascular;  Laterality: Right;  ? BIOPSY  08/04/2020  ? Procedure: BIOPSY;  Surgeon: StLadene ArtistMD;  [place] Service: Gastroenterology;;  ? BIOPSY  06/28/2021  ? Procedure: BIOPSY;  Surgeon: PyJerene BearsMD;  [place]: Gastroenterology;;  ? BOWEL RESECTION N/A 05/24/2021  ? Procedure: SMALL BOWEL RESECTION;  Surgeon: WaRolm BookbinderMD;  [place] Service: General;  Laterality: N/A;  ? CARDIAC CATHETERIZATION  05/2011  ? CARDIOVERSION N/A 06/05/2018  ? Procedure: CARDIOVERSION;  Surgeon: BeJolaine ArtistMD;  [place] [facility] Service: Cardiovascular;  Laterality: N/A;  ? CARDIOVERSION N/A 10/06/2019  ? Procedure: CARDIOVERSION;  Surgeon: BeJolaine Artist  MD;  Location: [place];  Service: Cardiovascular;  Laterality: N/A;  ? CHOLECYSTECTOMY N/A 05/24/2021  ? Procedure: OPEN CHOLECYSTECTOMY;  Surgeon: Rolm Bookbinder, MD;  Location: [place];  Service: General;  Laterality: N/A;  ? COLONOSCOPY WITH PROPOFOL N/A 09/24/2017  ? Procedure: COLONOSCOPY WITH PROPOFOL;  Surgeon: Jackquline Denmark, MD;  Location: [facility] ENDOSCOPY;  Service: Endoscopy;  Laterality: N/A;  ? ESOPHAGOGASTRODUODENOSCOPY N/A 09/19/2017  ?  Procedure: ESOPHAGOGASTRODUODENOSCOPY (EGD);  Surgeon: Jackquline Denmark, MD;  Location: [facility] ENDOSCOPY;  Service: Endoscopy;  Laterality: N/A;  ? ESOPHAGOGASTRODUODENOSCOPY N/A 05/29/2021  ? Procedure: ESOPHAGOGASTRODUODENOSCOPY (EGD);  Surgeon: Milus Banister, MD;  Location: [facility] ENDOSCOPY;  Service: Gastroenterology;  Laterality: N/A;  ? ESOPHAGOGASTRODUODENOSCOPY (EGD) WITH PROPOFOL N/A 08/04/2020  ? Procedure: ESOPHAGOGASTRODUODENOSCOPY (EGD) WITH PROPOFOL;  Surgeon: Ladene Artist, MD;  Location: [place];  Service: Gastroenterology;  Laterality: N/A;  ? ESOPHAGOGASTRODUODENOSCOPY (EGD) WITH PROPOFOL N/A 06/28/2021  ? Procedure: ESOPHAGOGASTRODUODENOSCOPY (EGD) WITH PROPOFOL;  Surgeon: Jerene Bears, MD;  Location: [facility] ENDOSCOPY;  Service: Gastroenterology;  Laterality: N/A;  ? ESOPHAGOGASTRODUODENOSCOPY (EGD) WITH PROPOFOL N/A 08/02/2021  ? Procedure: ESOPHAGOGASTRODUODENOSCOPY (EGD) WITH PROPOFOL;  Surgeon: Lavena Bullion, DO;  Location: [place] ENDOSCOPY;  Service: Gastroenterology;  Laterality: N/A;  ? ESOPHAGOGASTRODUODENOSCOPY (EGD) WITH PROPOFOL N/A 08/10/2021  ? Procedure: ESOPHAGOGASTRODUODENOSCOPY (EGD) WITH PROPOFOL;  Surgeon: Jackquline Denmark, MD;  Location: [place];  Service: Gastroenterology;  Laterality: N/A;  ? HEMOSTASIS CLIP PLACEMENT  05/29/2021  ? Procedure: HEMOSTASIS CLIP PLACEMENT;  Surgeon: Milus Banister, MD;  Location: [place];  Service: Gastroenterology;;  ? HOT HEMOSTASIS N/A 05/29/2021  ? Procedure: HOT HEMOSTASIS (ARGON PLASMA COAGULATION/BICAP);  Surgeon: Milus Banister, MD;  Location: [facility] ENDOSCOPY;  Service: Gastroenterology;  Laterality: N/A;  ? HOT HEMOSTASIS N/A 06/28/2021  ? Procedure: HOT HEMOSTASIS (ARGON PLASMA COAGULATION/BICAP);  Surgeon: Jerene Bears, MD;  Location: [facility] ENDOSCOPY;  Service: Gastroenterology;  Laterality: N/A;  ? HOT HEMOSTASIS N/A 08/02/2021  ? Procedure: HOT HEMOSTASIS (ARGON PLASMA COAGULATION/BICAP);  Surgeon: Lavena Bullion, DO;  Location:  [facility] ENDOSCOPY;  Service: Gastroenterology;  Laterality: N/A;  ? HOT HEMOSTASIS N/A 08/10/2021  ? Procedure: HOT HEMOSTASIS (ARGON PLASMA COAGULATION/BICAP);  Surgeon: Jackquline Denmark, MD;  Location: [facility] ENDOSCOPY;  Service: Gastroenterology;  Laterality: N/A;  ? IR PERC TUN PERIT CATH WO PORT S&I /IMAG  05/23/2021  ? IR REMOVAL TUN CV CATH W/O FL  06/10/2021  ? IR US GUIDE VASC ACCESS RIGHT  05/23/2021  ? LAPAROSCOPY N/A 05/24/2021  ? Procedure: LAPAROSCOPY DIAGNOSTIC;  Surgeon: Rolm Bookbinder, MD;  Location: [place];  Service: General;  Laterality: N/A;  ? LAPAROTOMY N/A 05/24/2021  ? Procedure: EXPLORATORY LAPAROTOMY;  Surgeon: Rolm Bookbinder, MD;  Location: [place];  Service: General;  Laterality: N/A;  ? LEFT AND RIGHT HEART CATHETERIZATION WITH CORONARY ANGIOGRAM N/A 06/11/2011  ? Procedure: LEFT AND RIGHT HEART CATHETERIZATION WITH CORONARY ANGIOGRAM;  Surgeon: Larey Dresser, MD;  Location: [facility] CATH LAB;  Service: Cardiovascular;  Laterality: N/A;  ? PERIPHERAL VASCULAR BALLOON ANGIOPLASTY Right 07/29/2020  ? Procedure: PERIPHERAL VASCULAR BALLOON ANGIOPLASTY;  Surgeon: Angelia Mould, MD;  Location: [place] CV LAB;  Service: Cardiovascular;  Laterality: Right;  arm fistula  ? REVISON OF ARTERIOVENOUS FISTULA Right 08/08/2020  ? Procedure: ANEURYSM EXCISION OF RIGHT UPPER EXTREMITY ARTERIOVENOUS FISTULA;  Surgeon: Angelia Mould, MD;  Location: [place];  Service: Vascular;  Laterality: Right;  ? SCLEROTHERAPY  05/29/2021  ? Procedure: SCLEROTHERAPY;  Surgeon: Milus Banister, MD;  Location: [facility] ENDOSCOPY;  Service: Gastroenterology;;  ? [place] REMOVAL  05/29/2021  ? Procedure: STENT REMOVAL;  Surgeon: Milus Banister, MD;  Location: [facility] ENDOSCOPY;  Service: Gastroenterology;;  ? TEE WITHOUT CARDIOVERSION N/A 12/27/2015  ? Procedure: TRANSESOPHAGEAL ECHOCARDIOGRAM (TEE);  Surgeon: Sueanne Margarita, MD;  Location: [place];  Service: Cardiovascular;  Laterality: N/A;  ? ?Social History:  reports that  he quit smoking about 5 years ago. His smoking use included cigarettes. He has a 20.50 pack-year smoking history. He has never used smokeless tobacco. He reports that he does not currently use alcohol after

## 2021-09-13 NOTE — Assessment & Plan Note (Addendum)
--   Presented with rapid ventricular response, now rate controlled.  No anticoagulation secondary to history of GI bleed.  Not on beta-blocker secondary to noncompliance.  ?-- Continue beta-blocker here.  Monitor. ?

## 2021-09-13 NOTE — ED Provider Notes (Signed)
?  Physical Exam  ?BP 104/83   Pulse (!) 130   Temp 97.6 ?F (36.4 ?C) (Oral)   Resp 15   Ht 5\' 11"  (1.803 m)   Wt 66.2 kg   SpO2 97%   BMI 20.36 kg/m?  ? ?Physical Exam ? ?Procedures  ?Marland KitchenCritical Care ?Performed by: Emeline Darling, PA-C ?Authorized by: Emeline Darling, PA-C  ? ?Critical care provider statement:  ?  Critical care time (minutes):  45 ?  Critical care was time spent personally by me on the following activities:  Development of treatment plan with patient or surrogate, discussions with consultants, evaluation of patient's response to treatment, examination of patient, obtaining history from patient or surrogate, ordering and performing treatments and interventions, ordering and review of laboratory studies, ordering and review of radiographic studies, pulse oximetry and re-evaluation of patient's condition ? ?ED Course / MDM  ? ?Clinical Course as of 09/13/21 1930  ?Wed Sep 13, 2021  ?1748 Consults to Dr. Rogers Blocker, hospitalist to agree with admitting patient to her service.  Will update her once I have heard from nephrology. [RS]  ?1810 Consult to nephrologist Dr. Moshe Cipro, who states that patient unfortunately not able to be dialyzed due to scheduling congestion.  She states we may administer 1 unit of blood in the ED, hold on any further transfusion after this. Communicated with admitting physician.  [RS]  ?  ?Clinical Course User Index ?[RS] Tanaya Dunigan, Gypsy Balsam, PA-C  ? ?Medical Decision Making ?Amount and/or Complexity of Data Reviewed ?Labs: ordered. ?Radiology: ordered. ? ?Risk ?Prescription drug management. ?Decision regarding hospitalization. ? ? ?Care of this patient assumed from preceding ED provider Eustaquio Maize, PA-C.  Please see her associated note for further insight of the patient's ED course.  In brief patient is a patient with sickle cell disease, ESRD Tuesday/Thursday/Saturday who last had dialysis on Saturday (5 days ago), CHF with EF 40 to 45%, A-fib not  anticoagulated secondary to GI bleed, who presents via EMS with concern for gradual onset low back pain and bilateral back pain, and shortness of breath with exertion for the last week.  Feeling very weak. ? ?Was found to be in A-fib with RVR upon arrival to emergency department with heart rate in the 140s.  Administered diltiazem 10 mg with improvement in his heart rate. ? ?At time of shift change she is awaiting the remainder of his laboratory studies and dispo planning.    ? ?CBC with leukocytosis of 10.8.  Anemia with hemoglobin 6.8 decreased from 7.19 days ago.  Reticulocytes significantly elevated today count percent 12.8, absolute count 256.6.  Concerning for acute [place] crisis today. ? ? On exam he has distended neck veins, clinically fluid overloaded. Type and screen ordered.  2 units ordered but after discussion with nephrology will administer only 1 unit at this time until dialysis can be coordinated likely tomorrow. ? ?Consult to hospitalist and nephrology as above.  Aadyn voiced understanding with medical evaluation and treatment plan.  Each of his questions was answered to his expressed satisfaction.  He is amenable to plan for admission at this time. ? ?This chart was dictated using voice recognition software, Dragon. Despite the best efforts of this provider to proofread and correct errors, errors may still occur which can change documentation meaning. ? ? ? ?  ?Emeline Darling, PA-C ?09/13/21 1930 ? ?  ?Jeanell Sparrow, DO ?09/14/21 4503 ? ?

## 2021-09-14 DIAGNOSIS — R9431 Abnormal electrocardiogram [ECG] [EKG]: Secondary | ICD-10-CM

## 2021-09-14 DIAGNOSIS — I10 Essential (primary) hypertension: Secondary | ICD-10-CM

## 2021-09-14 DIAGNOSIS — D649 Anemia, unspecified: Secondary | ICD-10-CM

## 2021-09-14 DIAGNOSIS — I132 Hypertensive heart and chronic kidney disease with heart failure and with stage 5 chronic kidney disease, or end stage renal disease: Secondary | ICD-10-CM | POA: Diagnosis not present

## 2021-09-14 DIAGNOSIS — I482 Chronic atrial fibrillation, unspecified: Secondary | ICD-10-CM

## 2021-09-14 DIAGNOSIS — I5042 Chronic combined systolic (congestive) and diastolic (congestive) heart failure: Secondary | ICD-10-CM | POA: Diagnosis not present

## 2021-09-14 DIAGNOSIS — I4821 Permanent atrial fibrillation: Secondary | ICD-10-CM | POA: Diagnosis not present

## 2021-09-14 DIAGNOSIS — Z992 Dependence on renal dialysis: Secondary | ICD-10-CM

## 2021-09-14 DIAGNOSIS — N186 End stage renal disease: Secondary | ICD-10-CM | POA: Diagnosis not present

## 2021-09-14 DIAGNOSIS — D5700 Hb-SS disease with crisis, unspecified: Secondary | ICD-10-CM | POA: Diagnosis not present

## 2021-09-14 DIAGNOSIS — D57419 Sickle-cell thalassemia with crisis, unspecified: Secondary | ICD-10-CM | POA: Diagnosis not present

## 2021-09-14 LAB — CBC
HCT: 22 % — ABNORMAL LOW (ref 39.0–52.0)
HCT: 20.9 % — ABNORMAL LOW (ref 39.0–52.0)
Hemoglobin: 7.8 g/dL — ABNORMAL LOW (ref 13.0–17.0)
Hemoglobin: 7.4 g/dL — ABNORMAL LOW (ref 13.0–17.0)
MCH: 33.8 pg (ref 26.0–34.0)
MCH: 33.6 pg (ref 26.0–34.0)
MCHC: 35.5 g/dL (ref 30.0–36.0)
MCHC: 35.4 g/dL (ref 30.0–36.0)
MCV: 95.2 fL (ref 80.0–100.0)
MCV: 95 fL (ref 80.0–100.0)
Platelets: 142 10*3/uL — ABNORMAL LOW (ref 150–400)
Platelets: 143 10*3/uL — ABNORMAL LOW (ref 150–400)
RBC: 2.31 MIL/uL — ABNORMAL LOW (ref 4.22–5.81)
RBC: 2.2 MIL/uL — ABNORMAL LOW (ref 4.22–5.81)
RDW: 24.3 % — ABNORMAL HIGH (ref 11.5–15.5)
RDW: 24 % — ABNORMAL HIGH (ref 11.5–15.5)
WBC: 10.1 10*3/uL (ref 4.0–10.5)
WBC: 10.2 10*3/uL (ref 4.0–10.5)
nRBC: 21.8 % — ABNORMAL HIGH (ref 0.0–0.2)
nRBC: 21.8 % — ABNORMAL HIGH (ref 0.0–0.2)

## 2021-09-14 LAB — RENAL FUNCTION PANEL
Albumin: 2.1 g/dL — ABNORMAL LOW (ref 3.5–5.0)
Anion gap: 15 (ref 5–15)
BUN: 62 mg/dL — ABNORMAL HIGH (ref 8–23)
CO2: 19 mmol/L — ABNORMAL LOW (ref 22–32)
Calcium: 8.4 mg/dL — ABNORMAL LOW (ref 8.9–10.3)
Chloride: 98 mmol/L (ref 98–111)
Creatinine, Ser: 7.34 mg/dL — ABNORMAL HIGH (ref 0.61–1.24)
GFR, Estimated: 8 mL/min — ABNORMAL LOW (ref >60)
Glucose, Bld: 89 mg/dL (ref 70–99)
Phosphorus: 7.3 mg/dL — ABNORMAL HIGH (ref 2.5–4.6)
Potassium: 5.4 mmol/L — ABNORMAL HIGH (ref 3.5–5.1)
Sodium: 132 mmol/L — ABNORMAL LOW (ref 135–145)

## 2021-09-14 MED ORDER — HYDROMORPHONE 1 MG/ML IV SOLN
INTRAVENOUS | Status: DC
  Administered 2021-09-14: 2 mg via INTRAVENOUS
  Administered 2021-09-14: 1 mg via INTRAVENOUS
  Administered 2021-09-15: 1.5 mg via INTRAVENOUS
  Administered 2021-09-15: 3 mg via INTRAVENOUS
  Administered 2021-09-15: 1 mg via INTRAVENOUS
  Administered 2021-09-15: 2 mg via INTRAVENOUS
  Administered 2021-09-15: 2.8 mg via INTRAVENOUS
  Administered 2021-09-16: 1 mg via INTRAVENOUS
  Administered 2021-09-16: 3 mg via INTRAVENOUS
  Administered 2021-09-16: 1.5 mg via INTRAVENOUS
  Administered 2021-09-16: 2.5 mg via INTRAVENOUS
  Administered 2021-09-16: 4 mg via INTRAVENOUS
  Administered 2021-09-16: 4.5 mg via INTRAVENOUS
  Administered 2021-09-16: 1 mg via INTRAVENOUS
  Administered 2021-09-17: 5 mg via INTRAVENOUS
  Administered 2021-09-17 (×2): 1 mg via INTRAVENOUS
  Filled 2021-09-14: qty 30

## 2021-09-14 MED ORDER — METOPROLOL TARTRATE 25 MG PO TABS
25.0000 mg | ORAL_TABLET | Freq: Once | ORAL | Status: AC
  Administered 2021-09-14: 25 mg via ORAL
  Filled 2021-09-14: qty 1

## 2021-09-14 NOTE — Hospital Course (Addendum)
66 year old male PMH sickle cell disease, ESRD on hemodialysis presenting with 48-hour history of lower back and leg pain consistent with typical sickle cell crisis.  Also noted to have atrial fibrillation with rapid ventricular response.  Admitted for further treatment. ?

## 2021-09-14 NOTE — Plan of Care (Signed)
?  Problem: Clinical Measurements: ?Goal: Will remain free from infection ?Outcome: Progressing ?Goal: Cardiovascular complication will be avoided ?Outcome: Progressing ?  ?Problem: Activity: ?Goal: Risk for activity intolerance will decrease ?Outcome: Progressing ?  ?Problem: Elimination: ?Goal: Will not experience complications related to bowel motility ?Outcome: Progressing ?  ?

## 2021-09-14 NOTE — Progress Notes (Signed)
?  Progress Note ? ? ?Patient: Patrick [name] HQP:[id number] DOB: 06/24/1955 DOA: 09/13/2021     1 ?DOS: the patient was seen and examined on 09/14/2021 ?  ?Brief hospital course: ?66 year old male PMH sickle cell disease, ESRD on hemodialysis presenting with 48-hour history of lower back and leg pain consistent with typical sickle cell crisis.  Also noted to have atrial fibrillation with rapid ventricular response.  Admitted for further treatment. ? ?Assessment and Plan: ?* Sickle cell crisis ([place]) ?-- pain remains uncontrolled, but typical for his usual crisis.  No shortness of breath.  Will confer with sickle cell specialist and adjust analgesia. ?--Hemoglobin increased status post transfusion.  Monitor blood counts.  We will continue hydroxyurea and folic acid. ? ?Acute on chronic anemia ?-- Secondary to sickle cell anemia.  Baseline hgb of 7-8.  Improved status post 1 unit PRBC.  PMH Hx of upper GI bleed due to AVM, last in 2/23 ? ? ?Chronic atrial fibrillation with RVR (HCC) ?-- Presented with rapid ventricular response, now rate controlled.  No anticoagulation secondary to history of GI bleed.  Not on beta-blocker secondary to noncompliance.  ?-- Continue beta-blocker here.  Monitor. ? ?ESRD on HD TTS ?--ESRD on dialysis TTS with last session on Saturday, missed Tuesday. ?--Management per nephrology. ? ?Chronic combined systolic and diastolic CHF (congestive heart failure) (HCC) ?-- Appears euvolemic.  Last echo: 12/22 with EF of 40-45% and decreased LVF ?--volume control per dialysis  ?-Started back beta blocker, but has been non compliant  ? ?Essential hypertension ?--No compliant with beta blocker and blood pressures have been well controlled ?--On metoprolol for rate control. ? ?Prolonged QT interval ?-- Borderline prolonged.  Monitor. ? ? ? ? ?  ? ?Subjective:  ?Pain 9/10, PCA not effective. Pain is usual low back and legs typical of SS crisis ?No SOB ?No n/v ? ?Physical Exam: ?Vitals:  ? 09/14/21 1330  09/14/21 1400 09/14/21 1430 09/14/21 1500  ?BP: (!) 112/53 108/69 115/84 115/74  ?Pulse: 81 (!) 104 66 78  ?Resp: 13 18 (!) 9 (!) 9  ?Temp:      ?TempSrc:      ?SpO2: 99% 90% (!) 58% 100%  ?Weight:      ?Height:      ? ?Physical Exam ?Vitals reviewed.  ?Constitutional:   ?   General: He is not in acute distress. ?   Appearance: He is ill-appearing. He is not toxic-appearing.  ?Cardiovascular:  ?   Rate and Rhythm: Normal rate and regular rhythm.  ?   Heart sounds: No murmur heard. ?Pulmonary:  ?   Effort: Pulmonary effort is normal. No respiratory distress.  ?   Breath sounds: Rhonchi present. No wheezing or rales.  ?Musculoskeletal:  ?   Right lower leg: No edema.  ?   Left lower leg: No edema.  ?Neurological:  ?   Mental Status: He is alert.  ?Psychiatric:     ?   Mood and Affect: Mood normal.     ?   Behavior: Behavior normal.  ? ? ?Data Reviewed: ? ?BMP noted -- defer to nephrology, currently on HD ?Hgb stable 7.4 s/p 1 unit PRBC ? ? ?Family Communication: none ? ?Disposition: ?Status is: Inpatient ?Remains inpatient appropriate because: SS crisis, uncontrolled pain requiring PCA ? Planned Discharge Destination: Home ? ? ? ?Time spent: 25 minutes ? ?Author: ?Murray Hodgkins, MD ?09/14/2021 3:25 PM ? ?For on call review [email or web address].  ?

## 2021-09-14 NOTE — Plan of Care (Signed)

## 2021-09-14 NOTE — Progress Notes (Addendum)
?[facility] ?Progress Note  ? ?Subjective: Seen in room. Maxed out on dilaudid PCA at the moment, asking for extra pain meds. C/O pain in upper legs and back. Denies SOB at present.  ? ?Seen again in HD unit. Asking for higher UFG. Will increase UFG to 4 liters ? ?Says he missed HD  03/25 because he was unable to get OOB and get dressed by himself. He is now willing to consider SNF placement as it appears it is no longer safe for him to live alone.  ? ?Objective ?Vitals:  ? 09/14/21 0420 09/14/21 0800 09/14/21 0844 09/14/21 1130  ?BP:   122/81 117/78  ?Pulse:      ?Resp: 11 11 17 14   ?Temp:    (!) 97.2 ?F (36.2 ?C)  ?TempSrc:    Oral  ?SpO2: 100% 100% 94% 100%  ?Weight:    65.2 kg  ?Height:      ? ?Physical Exam ?General: Chronically ill appearing male in NAD ?HEENT: Facial and periorbital edema present.  ?Heart: S1,S2 irreg, irreg. No M/R/G ?Lungs: Slightly decreased in bases few bibasilar crackles posteriorly.  ?Abdomen: slightly protuberant, NABS ?Extremities: trace -1+BLE edema ?Dialysis Access: L AVF cannulated ? ? ?Additional Objective ?Labs: ?Basic Metabolic Panel: ?Recent Labs  ?Lab 09/13/21 ?1305  ?NA 135  ?K 4.7  ?CL 98  ?CO2 20*  ?GLUCOSE 101*  ?BUN 55*  ?CREATININE 6.81*  ?CALCIUM 8.5*  ? ?Liver Function Tests: ?Recent Labs  ?Lab 09/13/21 ?1305  ?AST 16  ?ALT 17  ?ALKPHOS 197*  ?BILITOT 1.3*  ?PROT 7.5  ?ALBUMIN 2.2*  ? ?No results for input(s): LIPASE, AMYLASE in the last 168 hours. ?CBC: ?Recent Labs  ?Lab 09/13/21 ?1530 09/13/21 ?2322 09/14/21 ?0236  ?WBC 10.8* 10.1 10.2  ?NEUTROABS 8.9*  --   --   ?HGB 6.8* 7.8* 7.4*  ?HCT 19.6* 22.0* 20.9*  ?MCV 100.0 95.2 95.0  ?PLT 152 142* 143*  ? ?Blood Culture ?   ?Component Value Date/Time  ? SDES ABSCESS 08/06/2021 1110  ? [place] NONE 08/06/2021 1110  ? CULT  08/06/2021 1110  ?  FEW ESCHERICHIA COLI ?ABUNDANT BACTEROIDES FRAGILIS ?BETA LACTAMASE POSITIVE ?Performed at [facility], [place] [address], [place], [place] [address] ?  ?  REPTSTATUS 08/09/2021 FINAL 08/06/2021 1110  ? ? ?Cardiac Enzymes: ?No results for input(s): CKTOTAL, CKMB, CKMBINDEX, TROPONINI in the last 168 hours. ?CBG: ?No results for input(s): GLUCAP in the last 168 hours. ?Iron Studies: No results for input(s): IRON, TIBC, TRANSFERRIN, FERRITIN in the last 72 hours. ?@lablastinr3 @ ?Studies/Results: ?DG Chest Port 1 View ? ?Result Date: 09/13/2021 ?CLINICAL DATA:  Shortness of breath. EXAM: PORTABLE CHEST 1 VIEW COMPARISON:  August 24, 2021. FINDINGS: Stable cardiomegaly with mild central pulmonary vascular congestion. Mild bibasilar atelectasis or edema are noted. Bony thorax is unremarkable. IMPRESSION: Mild bibasilar subsegmental atelectasis or edema. Electronically Signed   By: Marijo Conception M.D.   On: 09/13/2021 14:12   ?Medications: ? ? (feeding supplement) PROSource Plus  30 mL Oral BID BM  ? allopurinol  100 mg Oral Daily  ? Chlorhexidine Gluconate Cloth  6 each Topical Q0600  ? cinacalcet  30 mg Oral Q T,Th,Sa-HD  ? doxercalciferol  6 mcg Intravenous Q T,Th,Sa-HD  ? ferric citrate  210 mg Oral TID WC  ? folic acid  1 mg Oral Daily  ? HYDROmorphone   Intravenous Q4H  ? hydroxyurea  500 mg Oral QAC breakfast  ? metoprolol tartrate  12.5 mg Oral  BID  ? multivitamin  1 tablet Oral QHS  ? pantoprazole  40 mg Oral BID  ? ?HD orders: [facility] T,TH,S ?4 hrs 180NRe 400/500 52.5 kg 3.0K/2.5 Ca UF profile 2 AVF ?-No heparin ?-Mircera 225 mcg IV q 2 weeks (last 09/07/2021) ?-Hectorol 6 mcg IV TIW ?-Sensipar 30 mg PO TIW ? ?Assessment/Plan: ?1. Sickle Cell Crisis: HGB 6.8 S/P 1 unit 09/13/2021. Pain management per primary.  ?2. Volume overload in setting of missed HD: HD today, lower volume as tolerated.  ?3. ESRD: T,Th,S-has been missing OP treatments. HD today on schedule. Next HD 09/16/2021.  ?3. Anemia - As noted above. Follow HGB. Would not allow HGB to go above 9.0 in SSD. Recent OP ESA.  ?4. Secondary hyperparathyroidism -Labs pending. Continue binders, VDRA, Sensipar ?5.  HTN/volume -As noted above. UF as tolerated.  ?6. Nutrition - Very low albumin. Renal diet with protein supps. ?7. H/O persistent AFIB. Off anticoagulation D/T GIB ?8. Combined systolic/diastolic HF: EF 03-01%. Managed by Dr. Haroldine Laws. Optimize volume with HD.  ? ? ? ?Rita H. Brown NP-C ?09/14/2021, 11:35 AM  ?[facility] ?[phone] ? ?I saw the patient and agree with the above assessment and plan.    ?Rexene Agent, MD  ?  ? ?

## 2021-09-14 NOTE — Progress Notes (Signed)
2137 Pt HR between 108-120s. HR goes up to 140s when pt gets up. Pt is currently sitting on the side of bed eating dinner. A.Kakrakandy,MD notified. RN will continue to monitor pt.  ? ?2139 Orders received. ?

## 2021-09-15 ENCOUNTER — Ambulatory Visit: Payer: HMO | Admitting: Gastroenterology

## 2021-09-15 DIAGNOSIS — D5700 Hb-SS disease with crisis, unspecified: Secondary | ICD-10-CM | POA: Diagnosis not present

## 2021-09-15 DIAGNOSIS — I158 Other secondary hypertension: Secondary | ICD-10-CM | POA: Diagnosis not present

## 2021-09-15 DIAGNOSIS — Z992 Dependence on renal dialysis: Secondary | ICD-10-CM | POA: Diagnosis not present

## 2021-09-15 DIAGNOSIS — N186 End stage renal disease: Secondary | ICD-10-CM | POA: Diagnosis not present

## 2021-09-15 LAB — LACTATE DEHYDROGENASE: LDH: 287 U/L — ABNORMAL HIGH (ref 98–192)

## 2021-09-15 LAB — CBC
HCT: 22.3 % — ABNORMAL LOW (ref 39.0–52.0)
Hemoglobin: 7.8 g/dL — ABNORMAL LOW (ref 13.0–17.0)
MCH: 33.5 pg (ref 26.0–34.0)
MCHC: 35 g/dL (ref 30.0–36.0)
MCV: 95.7 fL (ref 80.0–100.0)
Platelets: 160 10*3/uL (ref 150–400)
RBC: 2.33 MIL/uL — ABNORMAL LOW (ref 4.22–5.81)
RDW: 25.3 % — ABNORMAL HIGH (ref 11.5–15.5)
WBC: 9.3 10*3/uL (ref 4.0–10.5)
nRBC: 25.4 % — ABNORMAL HIGH (ref 0.0–0.2)

## 2021-09-15 MED ORDER — OXYCODONE-ACETAMINOPHEN 5-325 MG PO TABS
1.0000 | ORAL_TABLET | ORAL | Status: DC | PRN
  Administered 2021-09-15 – 2021-09-17 (×6): 1 via ORAL
  Filled 2021-09-15 (×6): qty 1

## 2021-09-15 NOTE — Progress Notes (Signed)
?  Transition of Care (TOC) Screening Note ? ? ?Patient Details  ?Name: Daleon D Mcnutt ?Date of Birth: [birth year] ? ? ?Transition of Care (TOC) CM/SW Contact:    ?Dahlia Client, Romeo Rabon, RN ?Phone Number: ?09/15/2021, 4:08 PM ? ? ? ?Transition of Care Department [facility]) has reviewed patient and acknowledge referral with regards to home concerns and possible SNF placement. Pt will need PT/OT evals to assist in determining safest disposition for pt. Please place PT/OT eval orders. We will continue to monitor patient advancement through interdisciplinary progression rounds. If new patient transition needs arise, please place a TOC consult. ?  ?

## 2021-09-15 NOTE — Progress Notes (Signed)
Subjective: ?Patrick [name] is a 66 year old male with a medical history significant for sickle cell disease, chronic pain syndrome, end stage renal disease, chronic conbined systolic diastolic CHF, opiate dependence and tolerance, history of atrial fibrillation, prolonged QT, history of gout, and anemia of chronic disease was admitted for sickle cell pain crisis.  Of note, patient is legally blind. ? ?Patient is complaining of intense pain to bilateral hips and lower extremities. He states, "I feel that a sledgehammer is hitting my back and hips". He says that his pain intensity is above a 10. He characterizes pain as constantly sharp.  Patient is holding on tightly to bed rails, he states that pain intensity increases with any movement. ?Patient's heartrate 140s-160s.  Metoprolol was restarted for rate control. ?Patient denies headache, shortness of breath, chest pain, nausea, vomiting, or diarrhea. ?Objective: ? ?Vital signs in last 24 hours: ? ?Vitals:  ? 09/15/21 1051 09/15/21 1303 09/15/21 1733 09/15/21 1753  ?BP: 107/86  112/77   ?Pulse: 96  91   ?Resp: 12 12 17 13   ?Temp: 98 ?F (36.7 ?C)  98.2 ?F (36.8 ?C)   ?TempSrc: Oral  Oral   ?SpO2: 100% 100% 100% 100%  ?Weight:      ?Height:      ? ? ?Intake/Output from previous day: ? ? ?Intake/Output Summary (Last 24 hours) at 09/15/2021 1846 ?Last data filed at 09/15/2021 1735 ?Gross per 24 hour  ?Intake 974.1 ml  ?Output 0 ml  ?Net 974.1 ml  ? ? ?Physical Exam: ?General: Alert, awake, oriented x3, writing in pain. Visibly ill.  ?HEENT: [place]/AT PEERL, EOMI ?Neck: Trachea midline,  no masses, no thyromegal,y no JVD, no carotid bruit ?OROPHARYNX:  Moist, No exudate/ erythema/lesions.  ?Heart: Regular rate and rhythm, without murmurs, rubs, gallops, PMI non-displaced, no heaves or thrills on palpation.  ?Lungs: Clear to auscultation, no wheezing or rhonchi noted. No increased vocal fremitus resonant to percussion  ?Abdomen: Soft, nontender, nondistended, positive bowel  sounds, no masses no hepatosplenomegaly noted.Marland Kitchen  ?Neuro: No focal neurological deficits noted cranial nerves II through XII grossly intact. DTRs 2+ bilaterally upper and lower extremities. Strength 5 out of 5 in bilateral upper and lower extremities. ?Musculoskeletal: Decreased ROM, bilateral lower extremities. No warm swelling or erythema around joints, no spinal tenderness noted. ?Psychiatric: Patient alert and oriented x3, good insight and cognition, good recent to remote recall. ?Lymph node survey: No cervical axillary or inguinal lymphadenopathy noted. ? ?Lab Results: ? ?Basic Metabolic Panel: ?   ?Component Value Date/Time  ? NA 132 (L) 09/14/2021 1147  ? NA 129 (L) 09/04/2021 1211  ? K 5.4 (H) 09/14/2021 1147  ? CL 98 09/14/2021 1147  ? CO2 19 (L) 09/14/2021 1147  ? BUN 62 (H) 09/14/2021 1147  ? BUN 34 (H) 09/04/2021 1211  ? CREATININE 7.34 (H) 09/14/2021 1147  ? CREATININE 2.90 (H) 08/11/2015 1359  ? GLUCOSE 89 09/14/2021 1147  ? CALCIUM 8.4 (L) 09/14/2021 1147  ? ?CBC: ?   ?Component Value Date/Time  ? WBC 9.3 09/15/2021 0120  ? HGB 7.8 (L) 09/15/2021 0120  ? HGB 7.1 (L) 09/04/2021 1211  ? HCT 22.3 (L) 09/15/2021 0120  ? HCT 19.9 (L) 09/04/2021 1211  ? PLT 160 09/15/2021 0120  ? PLT 174 09/04/2021 1211  ? MCV 95.7 09/15/2021 0120  ? MCV 94 09/04/2021 1211  ? NEUTROABS 8.9 (H) 09/13/2021 1530  ? NEUTROABS 3.4 09/04/2021 1211  ? LYMPHSABS 0.5 (L) 09/13/2021 1530  ? LYMPHSABS 0.8 09/04/2021 1211  ?  MONOABS 0.4 09/13/2021 1530  ? EOSABS 0.4 09/13/2021 1530  ? EOSABS 0.0 09/04/2021 1211  ? BASOSABS 0.1 09/13/2021 1530  ? BASOSABS 0.1 09/04/2021 1211  ? ? ?No results found for this or any previous visit (from the past 240 hour(s)). ? ?Studies/Results: ?No results found. ? ?Medications: ?Scheduled Meds: ? (feeding supplement) PROSource Plus  30 mL Oral BID BM  ? allopurinol  100 mg Oral Daily  ? Chlorhexidine Gluconate Cloth  6 each Topical Q0600  ? cinacalcet  30 mg Oral Q T,Th,Sa-HD  ? doxercalciferol  6 mcg  Intravenous Q T,Th,Sa-HD  ? ferric citrate  210 mg Oral TID WC  ? folic acid  1 mg Oral Daily  ? HYDROmorphone   Intravenous Q4H  ? hydroxyurea  500 mg Oral QAC breakfast  ? metoprolol tartrate  12.5 mg Oral BID  ? multivitamin  1 tablet Oral QHS  ? pantoprazole  40 mg Oral BID  ? ?Continuous Infusions: ?PRN Meds:.diphenhydrAMINE, naloxone **AND** sodium chloride flush, oxyCODONE-acetaminophen, polyethylene glycol ? ?Consultants: ?Nephrology, continue dialysis Tuesday, Thursday, Saturday ? ? ?Assessment/Plan: ?Principal Problem: ?  Sickle cell crisis ([place]) ?Active Problems: ?  Essential hypertension ?  Chronic combined systolic and diastolic CHF (congestive heart failure) ([place]) ?  ESRD on HD TTS ?  Prolonged QT interval ?  Acute on chronic anemia ?  Chronic atrial fibrillation with RVR (HCC) ? ?Sickle cell disease with pain crisis: ?Continue IV Dilaudid PCA without any changes in settings.  Initiate Percocet 5-325 mg every 4 hours as needed for severe breakthrough pain. ?Monitor vital signs closely, reevaluate pain scale regularly, and supplemental oxygen as needed. ? ?Anemia of chronic disease: ?Today, patient's hemoglobin is 7.8 g/dL, which is improved from 6.8 g/dL on admission.  Patient's baseline hemoglobin is 7-8 g/dL.  He is status post 1 unit PRBCs.  No further blood transfusion indicated at this time.  Monitor closely. ? ?Chronic atrial fibrillation with RVR : ?Heart rate fluctuating 140s-160s.  Metoprolol restarted for rate control.  Patient is currently not on anticoagulation secondary to his history of GI bleed. ? ?ESRD: ?Continue dialysis per nephrology on Tuesday, Thursday, Saturday. ? ?Chronic combined systolic and diastolic CHF: ?Patient's most recent echocardiogram on 12/22 showed EF of 40-45% and decreased LVEF.  Continue volume control per dialysis. ?Beta-blocker was restarted. ? ?Essential hypertension: ?Stable.  Monitor closely.  Continue telemetry. ? ? ?Code Status: Full Code ?Family  Communication: N/A ?Disposition Plan: Not yet ready for discharge ? ?[name]  APRN, MSN, FNP-C ?Patient [place] ?[facility] ?[address]  ?[place], [place] [address] ?[phone] ? ?If 7PM-7AM, please contact night-coverage. ? ?09/15/2021, 6:46 PM  LOS: 2 days   ?

## 2021-09-15 NOTE — Progress Notes (Signed)
Pt receives out-pt HD at [facility]) on TTS. Pt has a 5:45 chair time. Will assist as needed.  ? ?Melven Sartorius ?Renal Navigator ?[phone] ?

## 2021-09-15 NOTE — Progress Notes (Signed)
?[facility] ?Progress Note  ? ?Subjective: HD 09/14/2021. Net UF 4 liters. Still with LE edema today but feels much better. Having bursts of AFIB RVR on monitor. HR 132-155. Patient is asymptomatic, eating.  ? ?Objective ?Vitals:  ? 09/15/21 0504 09/15/21 0824 09/15/21 0834 09/15/21 1051  ?BP: 104/74 105/81  107/86  ?Pulse: 80 85  96  ?Resp: 18 13 19 12   ?Temp: 97.8 ?F (36.6 ?C) 97.9 ?F (36.6 ?C)  98 ?F (36.7 ?C)  ?TempSrc: Oral Oral  Oral  ?SpO2: 100% 100% 99% 100%  ?Weight:      ?Height:      ? ?Physical Exam ?General: Chronically ill appearing male in NAD ?HEENT: Facial and periorbital edema present.  ?Heart: S1,S2 irreg, irreg. No M/R/G ?Lungs: Slightly decreased in bases few bibasilar crackles posteriorly.  ?Abdomen: slightly protuberant, NABS ?Extremities: 1+BLE edema ?Dialysis Access: L AVF +TB ? ?Additional Objective ?Labs: ?Basic Metabolic Panel: ?Recent Labs  ?Lab 09/13/21 ?1305 09/14/21 ?1147  ?NA 135 132*  ?K 4.7 5.4*  ?CL 98 98  ?CO2 20* 19*  ?GLUCOSE 101* 89  ?BUN 55* 62*  ?CREATININE 6.81* 7.34*  ?CALCIUM 8.5* 8.4*  ?PHOS  --  7.3*  ? ?Liver Function Tests: ?Recent Labs  ?Lab 09/13/21 ?1305 09/14/21 ?1147  ?AST 16  --   ?ALT 17  --   ?ALKPHOS 197*  --   ?BILITOT 1.3*  --   ?PROT 7.5  --   ?ALBUMIN 2.2* 2.1*  ? ?No results for input(s): LIPASE, AMYLASE in the last 168 hours. ?CBC: ?Recent Labs  ?Lab 09/13/21 ?1530 09/13/21 ?2322 09/14/21 ?0236 09/15/21 ?0120  ?WBC 10.8* 10.1 10.2 9.3  ?NEUTROABS 8.9*  --   --   --   ?HGB 6.8* 7.8* 7.4* 7.8*  ?HCT 19.6* 22.0* 20.9* 22.3*  ?MCV 100.0 95.2 95.0 95.7  ?PLT 152 142* 143* 160  ? ?Blood Culture ?   ?Component Value Date/Time  ? SDES ABSCESS 08/06/2021 1110  ? [place] NONE 08/06/2021 1110  ? CULT  08/06/2021 1110  ?  FEW ESCHERICHIA COLI ?ABUNDANT BACTEROIDES FRAGILIS ?BETA LACTAMASE POSITIVE ?Performed at [facility], [place] [address], [place], [place] [address] ?  ? REPTSTATUS 08/09/2021 FINAL 08/06/2021 1110  ? ? ?Cardiac  Enzymes: ?No results for input(s): CKTOTAL, CKMB, CKMBINDEX, TROPONINI in the last 168 hours. ?CBG: ?No results for input(s): GLUCAP in the last 168 hours. ?Iron Studies: No results for input(s): IRON, TIBC, TRANSFERRIN, FERRITIN in the last 72 hours. ?@lablastinr3 @ ?Studies/Results: ?DG Chest Port 1 View ? ?Result Date: 09/13/2021 ?CLINICAL DATA:  Shortness of breath. EXAM: PORTABLE CHEST 1 VIEW COMPARISON:  August 24, 2021. FINDINGS: Stable cardiomegaly with mild central pulmonary vascular congestion. Mild bibasilar atelectasis or edema are noted. Bony thorax is unremarkable. IMPRESSION: Mild bibasilar subsegmental atelectasis or edema. Electronically Signed   By: Marijo Conception M.D.   On: 09/13/2021 14:12   ?Medications: ? ? (feeding supplement) PROSource Plus  30 mL Oral BID BM  ? allopurinol  100 mg Oral Daily  ? Chlorhexidine Gluconate Cloth  6 each Topical Q0600  ? cinacalcet  30 mg Oral Q T,Th,Sa-HD  ? doxercalciferol  6 mcg Intravenous Q T,Th,Sa-HD  ? ferric citrate  210 mg Oral TID WC  ? folic acid  1 mg Oral Daily  ? HYDROmorphone   Intravenous Q4H  ? hydroxyurea  500 mg Oral QAC breakfast  ? metoprolol tartrate  12.5 mg Oral BID  ? multivitamin  1 tablet Oral QHS  ?  pantoprazole  40 mg Oral BID  ? ? ? ?HD orders: [facility] T,TH,S ?4 hrs 180NRe 400/500 52.5 kg 3.0K/2.5 Ca UF profile 2 AVF ?-No heparin ?-Mircera 225 mcg IV q 2 weeks (last 09/07/2021) ?-Hectorol 6 mcg IV TIW ?-Sensipar 30 mg PO TIW ?  ?Assessment/Plan: ?1. Sickle Cell Crisis: HGB 7.8 today. S/P 1 unit PRBCs  09/13/2021. Pain management per primary.  ?2. Volume overload in setting of missed HD: HD 09/14/2021. Net UF 4 liters. Volume improved but has more to be removed. Max UF as tolerated in HD tomorrow. BP soft. May ultimately need midodrine to avoid hypotension during HD.  ?3. ESRD: T,Th,S-has been missing OP treatments. HD today on schedule. Next HD 09/16/2021.  ?3. Anemia - As noted above. Follow HGB. Appreciate management by Sickle Cell team.   Recent OP ESA.  ?4. Secondary hyperparathyroidism -PO4 elevated, Corrected Ca+ 9.9.  Continue binders,  decrease VDRA. Continue Sensipar ?5. HTN/volume -As noted above. UF as tolerated.  ?6. Nutrition - Very low albumin. Renal diet with protein supps. ?7. H/O persistent AFIB. Off anticoagulation D/T GIB. Afib RVR today. Primary notified. Asymptomatic.  ?8. Combined systolic/diastolic HF: EF 55-73%. Managed by Dr. Haroldine Laws. Optimize volume with  ? ?Elaiza Shoberg H. Vicki Pasqual NP-C ?09/15/2021, 12:15 PM  ?[facility] ?[phone] ? ? ?  ? ?

## 2021-09-16 DIAGNOSIS — I482 Chronic atrial fibrillation, unspecified: Secondary | ICD-10-CM | POA: Diagnosis not present

## 2021-09-16 DIAGNOSIS — D649 Anemia, unspecified: Secondary | ICD-10-CM | POA: Diagnosis not present

## 2021-09-16 DIAGNOSIS — D57419 Sickle-cell thalassemia with crisis, unspecified: Secondary | ICD-10-CM | POA: Diagnosis not present

## 2021-09-16 DIAGNOSIS — D5700 Hb-SS disease with crisis, unspecified: Secondary | ICD-10-CM | POA: Diagnosis not present

## 2021-09-16 LAB — CBC
HCT: 19.7 % — ABNORMAL LOW (ref 39.0–52.0)
Hemoglobin: 7.1 g/dL — ABNORMAL LOW (ref 13.0–17.0)
MCH: 34.3 pg — ABNORMAL HIGH (ref 26.0–34.0)
MCHC: 36 g/dL (ref 30.0–36.0)
MCV: 95.2 fL (ref 80.0–100.0)
Platelets: 153 10*3/uL (ref 150–400)
RBC: 2.07 MIL/uL — ABNORMAL LOW (ref 4.22–5.81)
RDW: 24.4 % — ABNORMAL HIGH (ref 11.5–15.5)
WBC: 9.2 10*3/uL (ref 4.0–10.5)
nRBC: 18.1 % — ABNORMAL HIGH (ref 0.0–0.2)

## 2021-09-16 LAB — RENAL FUNCTION PANEL
Albumin: 1.8 g/dL — ABNORMAL LOW (ref 3.5–5.0)
Anion gap: 12 (ref 5–15)
BUN: 43 mg/dL — ABNORMAL HIGH (ref 8–23)
CO2: 24 mmol/L (ref 22–32)
Calcium: 8 mg/dL — ABNORMAL LOW (ref 8.9–10.3)
Chloride: 97 mmol/L — ABNORMAL LOW (ref 98–111)
Creatinine, Ser: 5.25 mg/dL — ABNORMAL HIGH (ref 0.61–1.24)
GFR, Estimated: 12 mL/min — ABNORMAL LOW (ref >60)
Glucose, Bld: 95 mg/dL (ref 70–99)
Phosphorus: 4.8 mg/dL — ABNORMAL HIGH (ref 2.5–4.6)
Potassium: 4.3 mmol/L (ref 3.5–5.1)
Sodium: 133 mmol/L — ABNORMAL LOW (ref 135–145)

## 2021-09-16 MED ORDER — LIDOCAINE HCL (PF) 1 % IJ SOLN: 5.0000 mL | INTRAMUSCULAR | Status: DC | PRN

## 2021-09-16 MED ORDER — SODIUM CHLORIDE 0.9 % IV SOLN: 100.0000 mL | INTRAVENOUS | Status: DC | PRN

## 2021-09-16 MED ORDER — ALTEPLASE 2 MG IJ SOLR: 2.0000 mg | Freq: Once | INTRAMUSCULAR | Status: DC | PRN

## 2021-09-16 MED ORDER — PENTAFLUOROPROP-TETRAFLUOROETH EX AERO: 1.0000 "application " | INHALATION_SPRAY | CUTANEOUS | Status: DC | PRN

## 2021-09-16 MED ORDER — HEPARIN SODIUM (PORCINE) 1000 UNIT/ML DIALYSIS: 1000.0000 [IU] | INTRAMUSCULAR | Status: DC | PRN

## 2021-09-16 MED ORDER — LIDOCAINE-PRILOCAINE 2.5-2.5 % EX CREA: 1.0000 "application " | TOPICAL_CREAM | CUTANEOUS | Status: DC | PRN

## 2021-09-16 NOTE — Progress Notes (Signed)
Pt transferred to HD this morning before new PCA syringe sent up from pharmacy.  This RN received syringe and delivered to HD, scanned and replaced in pca pump. Pt delivered dose without complications. ?

## 2021-09-16 NOTE — Progress Notes (Signed)
removed 4099mls net fluid, no complaints no complications.  pre bp 101/61 post bp 109/61 pre weight 90.2kg post weight 86.0kg.  2 bandages to rua avf no bleeding dressing cdi.  gave hectorol as ordered. ?

## 2021-09-16 NOTE — Progress Notes (Signed)
Patient ID: Patrick [name], male   DOB: 07-08-1955, 66 y.o.   MRN: [id number] ?Subjective: ?Patrick [name] is a 66 year old male with a medical history significant for sickle cell disease, chronic pain syndrome, end stage renal disease, chronic conbined systolic diastolic CHF, opiate dependence and tolerance, history of atrial fibrillation, prolonged QT, history of gout, and anemia of chronic disease was admitted for sickle cell pain crisis.  ? ?Patient was seen at dialysis session, he said he is feeling much better today.  Back pain is much better, still having significant pain in his lower extremities but significantly better than yesterday.  He has no new complaints today.  He denies any cough, chest pain, shortness of breath, nausea, vomiting or diarrhea.  No fever.  No palpitations. ? ?Objective: ? ?Vital signs in last 24 hours: ? ?Vitals:  ? 09/16/21 1200 09/16/21 1230 09/16/21 1333 09/16/21 1335  ?BP: 109/61 (!) 109/51 113/70   ?Pulse: 88 83 (!) 108   ?Resp: 15 (!) 30 16 19   ?Temp:   (!) 97.5 ?F (36.4 ?C)   ?TempSrc:   Oral   ?SpO2: 100% 100% 100% 99%  ?Weight:      ?Height:      ? ?Intake/Output from previous day: ? ? ?Intake/Output Summary (Last 24 hours) at 09/16/2021 1403 ?Last data filed at 09/16/2021 0700 ?Gross per 24 hour  ?Intake 258.6 ml  ?Output 0 ml  ?Net 258.6 ml  ? ? ?Physical Exam: ?General: Alert, awake, oriented x3, chronically ill looking but in no acute distress.  ?HEENT: [place]/AT PEERL, EOMI ?Neck: Trachea midline,  no masses, no thyromegal,y no JVD, no carotid bruit ?OROPHARYNX:  Moist, No exudate/ erythema/lesions.  ?Heart: Regular rate and rhythm, without murmurs, rubs, gallops, PMI non-displaced, no heaves or thrills on palpation.  ?Lungs: Clear to auscultation, no wheezing or rhonchi noted. No increased vocal fremitus resonant to percussion  ?Abdomen: Soft, nontender, nondistended, positive bowel sounds, no masses no hepatosplenomegaly noted.[place]  ?Neuro: No focal neurological deficits  noted cranial nerves II through XII grossly intact. DTRs 2+ bilaterally upper and lower extremities. Strength 5 out of 5 in bilateral upper and lower extremities. ?Musculoskeletal: Bilateral lower limb pitting edema, slightly tender to touch. ?Psychiatric: Patient alert and oriented x3, good insight and cognition, good recent to remote recall. ?Lymph node survey: No cervical axillary or inguinal lymphadenopathy noted. ? ?Lab Results: ? ?Basic Metabolic Panel: ?   ?Component Value Date/Time  ? NA 133 (L) 09/16/2021 0141  ? NA 129 (L) 09/04/2021 1211  ? K 4.3 09/16/2021 0141  ? CL 97 (L) 09/16/2021 0141  ? CO2 24 09/16/2021 0141  ? BUN 43 (H) 09/16/2021 0141  ? BUN 34 (H) 09/04/2021 1211  ? CREATININE 5.25 (H) 09/16/2021 0141  ? CREATININE 2.90 (H) 08/11/2015 1359  ? GLUCOSE 95 09/16/2021 0141  ? CALCIUM 8.0 (L) 09/16/2021 0141  ? ?CBC: ?   ?Component Value Date/Time  ? WBC 9.2 09/16/2021 0141  ? HGB 7.1 (L) 09/16/2021 0141  ? HGB 7.1 (L) 09/04/2021 1211  ? HCT 19.7 (L) 09/16/2021 0141  ? HCT 19.9 (L) 09/04/2021 1211  ? PLT 153 09/16/2021 0141  ? PLT 174 09/04/2021 1211  ? MCV 95.2 09/16/2021 0141  ? MCV 94 09/04/2021 1211  ? NEUTROABS 8.9 (H) 09/13/2021 1530  ? NEUTROABS 3.4 09/04/2021 1211  ? LYMPHSABS 0.5 (L) 09/13/2021 1530  ? LYMPHSABS 0.8 09/04/2021 1211  ? MONOABS 0.4 09/13/2021 1530  ? EOSABS 0.4 09/13/2021 1530  ? EOSABS  0.0 09/04/2021 1211  ? BASOSABS 0.1 09/13/2021 1530  ? BASOSABS 0.1 09/04/2021 1211  ? ? ?No results found for this or any previous visit (from the past 240 hour(s)). ? ?Studies/Results: ?No results found. ? ?Medications: ?Scheduled Meds: ? (feeding supplement) PROSource Plus  30 mL Oral BID BM  ? allopurinol  100 mg Oral Daily  ? Chlorhexidine Gluconate Cloth  6 each Topical Q0600  ? cinacalcet  30 mg Oral Q T,Th,Sa-HD  ? doxercalciferol  6 mcg Intravenous Q T,Th,Sa-HD  ? ferric citrate  210 mg Oral TID WC  ? folic acid  1 mg Oral Daily  ? HYDROmorphone   Intravenous Q4H  ? hydroxyurea  500  mg Oral QAC breakfast  ? metoprolol tartrate  12.5 mg Oral BID  ? multivitamin  1 tablet Oral QHS  ? pantoprazole  40 mg Oral BID  ? ?Continuous Infusions: ?PRN Meds:.diphenhydrAMINE, naloxone **AND** sodium chloride flush, oxyCODONE-acetaminophen, polyethylene glycol ? ?Consultants: ?Nephrology ? ?Procedures: ?Dialysis ? ?Antibiotics: ?None ? ?Assessment/Plan: ?Principal Problem: ?  Sickle cell crisis ([place]) ?Active Problems: ?  Essential hypertension ?  Chronic combined systolic and diastolic CHF (congestive heart failure) ([place]) ?  ESRD on HD TTS ?  Prolonged QT interval ?  Acute on chronic anemia ?  Chronic atrial fibrillation with RVR (HCC) ? ?Hb Sickle Cell Disease with Pain crisis: Continue weight based Dilaudid PCA at current dose setting, continue Percocet as ordered. Monitor vitals very closely, Re-evaluate pain scale regularly, 2 L of Oxygen by [place]. ?Chronic atrial fibrillation: Heart rate is much improved today.  Patient is on metoprolol but not anticoagulated because of GI bleed.  We will continue to monitor closely. ?Anemia of Chronic Disease: Hemoglobin is between 7.1 and 7.8 which is consistent with patient's baseline.  There is no clinical indication for blood transfusion today.  Patient is status post transfusion of 1 unit of packed red blood cell. ?Chronic pain Syndrome: Continue Percocet as ordered. ?Essential hypertension: Controlled.  We will continue home medications and monitor very closely. ?Chronic combined systolic and diastolic CHF: Most recent echocardiogram showed left ventricular ejection fraction of 40 to 45%.  We will continue current management and volume control. ?End-Stage Renal Disease: Continue hemodialysis on Tuesday, Thursday and Saturday. ? ?Code Status: Full Code ?Family Communication: N/A ?Disposition Plan: Not yet ready for discharge ? ?Patrick [name]  ?If 7PM-7AM, please contact night-coverage. ? ?09/16/2021, 2:03 PM  LOS: 3 days   ?

## 2021-09-16 NOTE — Procedures (Signed)
I was present at this dialysis session. I have reviewed the session itself and made appropriate changes.  ? ?2K bath, UF goal 4L.  Pt tolerating tx well.  Using AVF.  Hb 7.1, transfusions per sickle cell service.   ? ?Filed Weights  ? 09/14/21 1600 09/16/21 0423 09/16/21 0847  ?Weight: 61.2 kg 64.5 kg 66 kg  ? ? ?Recent Labs  ?Lab 09/16/21 ?0141  ?NA 133*  ?K 4.3  ?CL 97*  ?CO2 24  ?GLUCOSE 95  ?BUN 43*  ?CREATININE 5.25*  ?CALCIUM 8.0*  ?PHOS 4.8*  ? ? ?Recent Labs  ?Lab 09/13/21 ?1530 09/13/21 ?2322 09/14/21 ?0236 09/15/21 ?0120 09/16/21 ?0141  ?WBC 10.8*   < > 10.2 9.3 9.2  ?NEUTROABS 8.9*  --   --   --   --   ?HGB 6.8*   < > 7.4* 7.8* 7.1*  ?HCT 19.6*   < > 20.9* 22.3* 19.7*  ?MCV 100.0   < > 95.0 95.7 95.2  ?PLT 152   < > 143* 160 153  ? < > = values in this interval not displayed.  ? ? ?Scheduled Meds: ? (feeding supplement) PROSource Plus  30 mL Oral BID BM  ? allopurinol  100 mg Oral Daily  ? Chlorhexidine Gluconate Cloth  6 each Topical Q0600  ? cinacalcet  30 mg Oral Q T,Th,Sa-HD  ? doxercalciferol  6 mcg Intravenous Q T,Th,Sa-HD  ? ferric citrate  210 mg Oral TID WC  ? folic acid  1 mg Oral Daily  ? HYDROmorphone   Intravenous Q4H  ? hydroxyurea  500 mg Oral QAC breakfast  ? metoprolol tartrate  12.5 mg Oral BID  ? multivitamin  1 tablet Oral QHS  ? pantoprazole  40 mg Oral BID  ? ?Continuous Infusions: ? sodium chloride    ? sodium chloride    ? sodium chloride    ? sodium chloride    ? ?PRN Meds:.sodium chloride, sodium chloride, sodium chloride, sodium chloride, alteplase, diphenhydrAMINE, heparin, lidocaine (PF), lidocaine-prilocaine, naloxone **AND** sodium chloride flush, oxyCODONE-acetaminophen, pentafluoroprop-tetrafluoroeth, polyethylene glycol   ?Patrick Grippe  MD ?09/16/2021, 9:42 AM ?  ?

## 2021-09-17 DIAGNOSIS — G894 Chronic pain syndrome: Secondary | ICD-10-CM

## 2021-09-17 DIAGNOSIS — D649 Anemia, unspecified: Secondary | ICD-10-CM | POA: Diagnosis not present

## 2021-09-17 DIAGNOSIS — D571 Sickle-cell disease without crisis: Secondary | ICD-10-CM

## 2021-09-17 DIAGNOSIS — I5042 Chronic combined systolic (congestive) and diastolic (congestive) heart failure: Secondary | ICD-10-CM | POA: Diagnosis not present

## 2021-09-17 DIAGNOSIS — I482 Chronic atrial fibrillation, unspecified: Secondary | ICD-10-CM | POA: Diagnosis not present

## 2021-09-17 DIAGNOSIS — D57419 Sickle-cell thalassemia with crisis, unspecified: Secondary | ICD-10-CM | POA: Diagnosis not present

## 2021-09-17 LAB — TYPE AND SCREEN
ABO/RH(D): AB POS
Antibody Screen: NEGATIVE
Unit division: 0
Unit division: 0

## 2021-09-17 LAB — BPAM RBC
Blood Product Expiration Date: 202304202359
Blood Product Expiration Date: 202304242359
ISSUE DATE / TIME: 202303291830
Unit Type and Rh: 600
Unit Type and Rh: 600

## 2021-09-17 LAB — CBC
HCT: 22.1 % — ABNORMAL LOW (ref 39.0–52.0)
Hemoglobin: 7.5 g/dL — ABNORMAL LOW (ref 13.0–17.0)
MCH: 32.8 pg (ref 26.0–34.0)
MCHC: 33.9 g/dL (ref 30.0–36.0)
MCV: 96.5 fL (ref 80.0–100.0)
Platelets: 179 10*3/uL (ref 150–400)
RBC: 2.29 MIL/uL — ABNORMAL LOW (ref 4.22–5.81)
RDW: 24.5 % — ABNORMAL HIGH (ref 11.5–15.5)
WBC: 8.6 10*3/uL (ref 4.0–10.5)
nRBC: 8.9 % — ABNORMAL HIGH (ref 0.0–0.2)

## 2021-09-17 MED ORDER — OXYCODONE HCL 5 MG PO CAPS: 10.0000 mg | ORAL_CAPSULE | Freq: Four times a day (QID) | ORAL | 0 refills | Status: DC | PRN

## 2021-09-17 NOTE — Discharge Summary (Signed)
Physician Discharge Summary  ?Patrick [name] RFF:[id number] DOB: 1956/05/27 DOA: 09/13/2021 ? ?PCP: [name], MD ? ?Admit date: 09/13/2021 ? ?Discharge date: 09/17/2021 ? ?Discharge Diagnoses:  ?Principal Problem: ?  Sickle-cell thalassemia with crisis ([place]) ?Active Problems: ?  Essential hypertension ?  Chronic combined systolic and diastolic CHF (congestive heart failure) ([place]) ?  ESRD on HD TTS ?  Prolonged QT interval ?  Acute on chronic anemia ?  Chronic atrial fibrillation with RVR (HCC) ? ?Discharge Condition: Stable ? ?Disposition:  ? Follow-up Information   ? ? [name], MD. Schedule an appointment as soon as possible for a visit in 1 week(s).   ?Specialty: Internal Medicine ?Contact information: ?[address] ?[place] [address] ?[phone] ? ? ?  ?  ? ?  ?  ? ?  ? ?Pt is discharged home in good condition and is to follow up with [name], MD this week to have labs evaluated. Patrick [name] is instructed to increase activity slowly and balance with rest for the next few days, and use prescribed medication to complete treatment of pain ? ?Diet: Regular ?Wt Readings from Last 3 Encounters:  ?09/17/21 63.2 kg  ?09/04/21 66.2 kg  ?08/29/21 55 kg  ? ? ?History of present illness:  ?Patrick [name] is a 66 y.o. male with medical history significant of  ESRD HD TTS, CHF, sickle cell disease, permanent A-fib not on anticoagulation, upper GI bleed due to AVMs, COPD, laparotomy/cholecystectomy/ischemic small bowel resection in 12/22 and recent hospitalization from SBO that resolved with NGT and pelvic abscess that was treated with drain placement and antibiotics. He states he started to have shortness of breath on Sunday and then Monday he had bilateral leg pain and low back pain. He was supposed to have dialysis on Tuesday but he was too tired to go. He denies any fever/chills or recent illness. He has had some dizziness going from sitting to standing. Denies any  blood in stool or hematemesis.  ?  ?Recently admitted on 3/7-3/15 for severe abdominal pain and high grade SBO. Treated conservatively.  ?  ?He has been feeling good. Denies any fever/chills, vision changes/headaches, chest pain or palpitations, +SOB, no abdominal pain, N/V/D, dysuria or leg swelling. He does not smoke or drink. ?  ?ER Course:  vitals: temp: 97.5, bp: 118/75, HR: 149, RR: 22, oxygen: 96%RA ?Pertinent labs: BUN: 55, creatinine: 6.81, alk phos: 197, wbc: 10.8, hgb: 6.8,  ?CXR: mild bibasilar subsegmental atelectasis or edema  ?In ED 2 units of RBC ordered. Given 80m dilaudid total. 124mcardizem.  ?Nephrology consulted.  ? ?Hospital Course:  ?Patient was admitted for sickle cell pain crisis in the setting of end-stage renal disease on hemodialysis Tuesday Thursday and Saturday.  He was managed appropriately with IV Dilaudid via PCA as well as other adjunct therapies per sickle cell pain management protocols.  Percocet 5-325 mg was added for pain control.  Hemoglobin dropped to a nadir of 6.8 for which patient was transfused with 1 unit of packed red blood cell with improvement in hemoglobin to baseline.  Patient was maintained on his dialysis scheduled and he remained euvolemic during this admission.  He had chronic atrial fibrillation with occasional RVR during this admission, controlled with metoprolol.  Patient remained hemodynamically stable throughout his admission.  Blood pressure was controlled with metoprolol.  Patient did well, pain returned to baseline, hemoglobin returned to baseline, no bleeding was noticed throughout this admission, patient tolerating p.o. intake  with no significant restrictions, ambulating well with no significant pain.  Patient requested to be discharged today as his pain is now well controlled. ? ?Patient was therefore discharged home today in a hemodynamically stable condition.  ? ?Patrick [name] will follow-up with PCP within 1 week of this discharge. Patrick [name] was  counseled extensively about nonpharmacologic means of pain management, patient verbalized understanding and was appreciative of  the care received during this admission.  ? ?We discussed the need for good hydration, monitoring of hydration status, avoidance of heat, cold, stress, and infection triggers. We discussed the need to be adherent with taking Hydrea and other home medications. Patient was reminded of the need to seek medical attention immediately if any symptom of bleeding, anemia, or infection occurs. ? ?Discharge Exam: ?Vitals:  ? 09/17/21 0809 09/17/21 0842  ?BP: 111/75   ?Pulse: 93   ?Resp: 11 11  ?Temp: 97.6 ?F (36.4 ?C)   ?SpO2: 100% 100%  ? ?Vitals:  ? 09/17/21 0623 09/17/21 0518 09/17/21 0809 09/17/21 7628  ?BP: 107/78 105/74 111/75   ?Pulse: 98 79 93   ?Resp: _0 ?Temp: 98 ?F (36.7 ?C) 97.8 ?F (36.6 ?C) 97.6 ?F (36.4 ?C)   ?TempSrc: Oral Oral Oral   ?SpO2: 99% 99% 100% 100%  ?Weight:  63.2 kg    ?Height:      ? ? ?General appearance : Awake, alert, not in any distress. Speech Clear.  Chronically ill looking but not toxic ?HEENT: Atraumatic and Normocephalic, pupils equally reactive to light and accomodation ?Neck: Supple, no JVD. No cervical lymphadenopathy.  ?Chest: Good air entry bilaterally, no added sounds  ?CVS: S1 S2 regular, no murmurs.  ?Abdomen: Bowel sounds present, Non tender and not distended with no gaurding, rigidity or rebound. ?Extremities: B/L Lower Ext shows mild pitting edema, both legs are warm to touch ?Neurology: Awake alert, and oriented X 3, CN II-XII intact, Non focal ?Skin: No Rash ? ?Discharge Instructions ? ?Discharge Instructions   ? ? Diet - low sodium heart healthy   Complete by: As directed ?  ? Increase activity slowly   Complete by: As directed ?  ? ?  ? ?Allergies as of 09/17/2021   ?No Known Allergies ?  ? ?  ?Medication List  ?  ? ?STOP taking these medications   ? ?traMADol 50 MG tablet ?Commonly known as: ULTRAM ?  ? ?  ? ?TAKE these medications    ? ?(feeding supplement) PROSource Plus liquid ?Take 30 mLs by mouth 2 (two) times daily between meals. ?  ?acetaminophen 500 MG tablet ?Commonly known as: TYLENOL ?Take 500 mg by mouth every 6 (six) hours as needed for moderate pain. ?  ?allopurinol 100 MG tablet ?Commonly known as: ZYLOPRIM ?TAKE 1 TABLET BY MOUTH EVERY DAY ?  ?Auryxia 1 GM 210 MG(Fe) tablet ?Generic drug: ferric citrate ?Take 210 mg by mouth 3 (three) times daily. ?  ?cinacalcet 30 MG tablet ?Commonly known as: SENSIPAR ?Take 1 tablet (30 mg total) by mouth Every Tuesday,Thursday,and Saturday with dialysis. ?  ?fluticasone 50 MCG/ACT nasal spray ?Commonly known as: FLONASE ?Place 2 sprays into both nostrils daily. ?  ?folic acid 1 MG tablet ?Commonly known as: FOLVITE ?TAKE 1 TABLET BY MOUTH EVERY DAY ?  ?hydroxyurea 500 MG capsule ?Commonly known as: HYDREA ?Take 1 capsule (500 mg total) by mouth daily. TAKE 1 CAPSULE BY MOUTH EVERY DAY WITH FOOD TO MINIMIZE GI SIDE EFFECTS ?What changed:  ?when to take this ?additional instructions ?  ?  lidocaine-prilocaine cream ?Commonly known as: EMLA ?Apply 1 application. topically Every Tuesday,Thursday,and Saturday with dialysis. ?  ?multivitamin Tabs tablet ?Take 1 tablet by mouth at bedtime. ?  ?oxycodone 5 MG capsule ?Commonly known as: OXY-IR ?Take 2 capsules (10 mg total) by mouth every 6 (six) hours as needed for up to 15 days for pain. ?  ?pantoprazole 40 MG tablet ?Commonly known as: PROTONIX ?TAKE 1 TABLET BY MOUTH TWICE A DAY ?  ?tiZANidine 4 MG tablet ?Commonly known as: ZANAFLEX ?Take 0.5 tablets (2 mg total) by mouth every 8 (eight) hours as needed for muscle spasms. ?  ?torsemide 20 MG tablet ?Commonly known as: DEMADEX ?Take 80 mg by mouth daily. ?  ?VISINE DRY EYE OP ?Place 1 drop into both eyes as needed (dryness). ?  ?Vitamin D (Ergocalciferol) 1.25 MG (50000 UNIT) Caps capsule ?Commonly known as: DRISDOL ?TAKE 1 CAPSULE BY MOUTH ONE TIME PER WEEK ?What changed: See the new  instructions. ?  ? ?  ? ? ?The results of significant diagnostics from this hospitalization (including imaging, microbiology, ancillary and laboratory) are listed below for reference.   ? ?Significant Diagnostic Studies: ?CT AB

## 2021-09-17 NOTE — Progress Notes (Signed)
?[facility] ?Progress Note  ? ?Subjective:   Tolerated 4L UF yesterday. Still feels like his legs are a bit swollen. Denies SOB, CP, palpitations, dizziness and nausea.  ? ?Objective ?Vitals:  ? 09/17/21 3474 09/17/21 0518 09/17/21 0809 09/17/21 2595  ?BP: 107/78 105/74 111/75   ?Pulse: 98 79 93   ?Resp: 16 17 11 11   ?Temp: 98 ?F (36.7 ?C) 97.8 ?F (36.6 ?C) 97.6 ?F (36.4 ?C)   ?TempSrc: Oral Oral Oral   ?SpO2: 99% 99% 100% 100%  ?Weight:  63.2 kg    ?Height:      ? ?Physical Exam ?General: Chronically ill appearing male in NAD ?Heart: S1,S2 irreg, irreg. No M/R/G ?Lungs: CTA bilaterally without wheezing, rhonchi or rales ?Abdomen: slightly distended, soft, +BS ?Extremities: 1+BLE edema ?Dialysis Access: L AVF +TB ?  ? ?Additional Objective ?Labs: ?Basic Metabolic Panel: ?Recent Labs  ?Lab 09/13/21 ?1305 09/14/21 ?1147 09/16/21 ?0141  ?NA 135 132* 133*  ?K 4.7 5.4* 4.3  ?CL 98 98 97*  ?CO2 20* 19* 24  ?GLUCOSE 101* 89 95  ?BUN 55* 62* 43*  ?CREATININE 6.81* 7.34* 5.25*  ?CALCIUM 8.5* 8.4* 8.0*  ?PHOS  --  7.3* 4.8*  ? ?Liver Function Tests: ?Recent Labs  ?Lab 09/13/21 ?1305 09/14/21 ?1147 09/16/21 ?0141  ?AST 16  --   --   ?ALT 17  --   --   ?ALKPHOS 197*  --   --   ?BILITOT 1.3*  --   --   ?PROT 7.5  --   --   ?ALBUMIN 2.2* 2.1* 1.8*  ? ?No results for input(s): LIPASE, AMYLASE in the last 168 hours. ?CBC: ?Recent Labs  ?Lab 09/13/21 ?1530 09/13/21 ?2322 09/14/21 ?0236 09/15/21 ?0120 09/16/21 ?0141 09/17/21 ?0118  ?WBC 10.8* 10.1 10.2 9.3 9.2 8.6  ?NEUTROABS 8.9*  --   --   --   --   --   ?HGB 6.8* 7.8* 7.4* 7.8* 7.1* 7.5*  ?HCT 19.6* 22.0* 20.9* 22.3* 19.7* 22.1*  ?MCV 100.0 95.2 95.0 95.7 95.2 96.5  ?PLT 152 142* 143* 160 153 179  ? ?Blood Culture ?   ?Component Value Date/Time  ? SDES ABSCESS 08/06/2021 1110  ? [place] NONE 08/06/2021 1110  ? CULT  08/06/2021 1110  ?  FEW ESCHERICHIA COLI ?ABUNDANT BACTEROIDES FRAGILIS ?BETA LACTAMASE POSITIVE ?Performed at [facility], [place]  [address] [place], [place] [address] ?  ? REPTSTATUS 08/09/2021 FINAL 08/06/2021 1110  ? ? ? ?Medications: ? ? (feeding supplement) PROSource Plus  30 mL Oral BID BM  ? allopurinol  100 mg Oral Daily  ? Chlorhexidine Gluconate Cloth  6 each Topical Q0600  ? cinacalcet  30 mg Oral Q T,Th,Sa-HD  ? doxercalciferol  6 mcg Intravenous Q T,Th,Sa-HD  ? ferric citrate  210 mg Oral TID WC  ? folic acid  1 mg Oral Daily  ? HYDROmorphone   Intravenous Q4H  ? hydroxyurea  500 mg Oral QAC breakfast  ? metoprolol tartrate  12.5 mg Oral BID  ? multivitamin  1 tablet Oral QHS  ? pantoprazole  40 mg Oral BID  ? ? ?Dialysis Orders: ?[facility] T,TH,S ?4 hrs 180NRe 400/500 52.5 kg 3.0K/2.5 Ca UF profile 2 AVF ?-No heparin ?-Mircera 225 mcg IV q 2 weeks (last 09/07/2021) ?-Hectorol 6 mcg IV TIW ?-Sensipar 30 mg PO TIW ? ?Assessment/Plan: ?1. Sickle Cell Crisis: HGB 7.5 today. S/P 1 unit PRBCs  09/13/2021. Pain management per primary.  ?2. Volume overload in setting of  missed HD: Improved, weights trending down but still has some edema. Tolerating high UF goals (4L), continue max UF with HD as BP tolerates.  ?3. ESRD: T,Th,S-has been missing OP treatments. Continue TTS schedule.  ?3. Anemia - As noted above. Follow HGB. Appreciate management by Sickle Cell team.  Recent OP ESA.  ?4. Secondary hyperparathyroidism -PO4 elevated, Corrected Ca+ 9.8.  Continue binders,  VDRA and sensipar ?5. HTN/volume -As noted above. UF as tolerated.  ?6. Nutrition - Very low albumin. Renal diet with protein supps. ?7. H/O persistent AFIB. Off anticoagulation D/T GIB. Asymptomatic. On metoprolol for rate control ?8. Combined systolic/diastolic HF: EF 76-28%. Managed by Dr. Haroldine Laws. Optimize volume with HD ? ?Anice Paganini, PA-C ?09/17/2021, 10:18 AM  ?[facility] ?Pager: [phone] ? ? ?

## 2021-09-17 NOTE — Progress Notes (Signed)
Mobility Specialist Progress Note  ? ? 09/17/21 1139  ?Mobility  ?Activity Ambulated with assistance in hallway  ?Level of Assistance Standby assist, set-up cues, supervision of patient - no hands on  ?Assistive Device  ?(IV pole)  ?Distance Ambulated (ft) 470 ft  ?Activity Response Tolerated fair  ?$Mobility charge 1 Mobility  ? ?Pt received in bed and agreeable. C/o being a little tired and lightheaded upon return to room. Left with call bell in reach sitting EOB.  ? ?Hildred Alamin ?Mobility Specialist  ?  ?

## 2021-09-17 NOTE — Progress Notes (Signed)
Patient discharging home. IV removed without complications. Tele removed and CCMD notified. Discharge instructions given and medication administration discussed. All questions answered. Patient waiting for ride.  ?Patrick [name]  ?

## 2021-09-17 NOTE — Progress Notes (Signed)
PCA pump discontinued per MD. 14 mL wasted with second nurse verifier, Steffanie Dunn.  ?Patrick [name]  ?

## 2021-09-18 NOTE — TOC Transition Note (Signed)
Transition of care contact from inpatient facility ? ?Date of discharge: 09/17/21 ?Date of contact: 09/18/21 ?Method: Phone ?Spoke to: Patient ? ?Patient contacted to discuss transition of care from recent inpatient hospitalization. Patient was admitted to [facility] from 09/13/21-09/17/21 with discharge diagnosis of sickle cell crisis. ? ?Medication changes were reviewed. ? ?Patient will follow up with his outpatient HD unit on: Tuesday April 4th at [facility]. ? ?Tobie Poet, NP ?  ?

## 2021-09-19 ENCOUNTER — Telehealth: Payer: Self-pay | Admitting: Internal Medicine

## 2021-09-19 DIAGNOSIS — Z992 Dependence on renal dialysis: Secondary | ICD-10-CM | POA: Diagnosis not present

## 2021-09-19 DIAGNOSIS — D57819 Other sickle-cell disorders with crisis, unspecified: Secondary | ICD-10-CM | POA: Diagnosis not present

## 2021-09-19 DIAGNOSIS — D509 Iron deficiency anemia, unspecified: Secondary | ICD-10-CM | POA: Diagnosis not present

## 2021-09-19 DIAGNOSIS — N2581 Secondary hyperparathyroidism of renal origin: Secondary | ICD-10-CM | POA: Diagnosis not present

## 2021-09-19 DIAGNOSIS — D571 Sickle-cell disease without crisis: Secondary | ICD-10-CM | POA: Diagnosis not present

## 2021-09-19 DIAGNOSIS — D631 Anemia in chronic kidney disease: Secondary | ICD-10-CM | POA: Diagnosis not present

## 2021-09-19 DIAGNOSIS — N186 End stage renal disease: Secondary | ICD-10-CM | POA: Diagnosis not present

## 2021-09-19 DIAGNOSIS — D57219 Sickle-cell/Hb-C disease with crisis, unspecified: Secondary | ICD-10-CM | POA: Diagnosis not present

## 2021-09-19 LAB — HGB FRAC BY HPLC+SOLUBILITY
Hgb A: 50.9 % — ABNORMAL LOW (ref 96.4–98.8)
Hgb C: 0 %
Hgb E: 0 %
Hgb F: 9.5 % — ABNORMAL HIGH (ref 0.0–2.0)
Hgb S: 37 % — ABNORMAL HIGH
Hgb Solubility: POSITIVE — AB
Hgb Variant: 0 %

## 2021-09-19 LAB — HGB FRACTIONATION CASCADE: Hgb A2: 2.6 % (ref 1.8–3.2)

## 2021-09-19 NOTE — Telephone Encounter (Signed)
Cindy Nurse Case Manager from [facility] would like to speak to a member of the clinical care team about the patient's PHQ assessment today. ?Phone [phone] ?

## 2021-09-20 DIAGNOSIS — I5040 Unspecified combined systolic (congestive) and diastolic (congestive) heart failure: Secondary | ICD-10-CM | POA: Diagnosis not present

## 2021-09-20 DIAGNOSIS — I482 Chronic atrial fibrillation, unspecified: Secondary | ICD-10-CM | POA: Diagnosis not present

## 2021-09-20 DIAGNOSIS — D5740 Sickle-cell thalassemia without crisis: Secondary | ICD-10-CM | POA: Diagnosis not present

## 2021-09-20 DIAGNOSIS — Z681 Body mass index (BMI) 19 or less, adult: Secondary | ICD-10-CM | POA: Diagnosis not present

## 2021-09-21 DIAGNOSIS — D57419 Sickle-cell thalassemia with crisis, unspecified: Secondary | ICD-10-CM | POA: Diagnosis not present

## 2021-09-21 DIAGNOSIS — N186 End stage renal disease: Secondary | ICD-10-CM | POA: Diagnosis not present

## 2021-09-21 DIAGNOSIS — Z992 Dependence on renal dialysis: Secondary | ICD-10-CM | POA: Diagnosis not present

## 2021-09-25 ENCOUNTER — Other Ambulatory Visit: Payer: Self-pay | Admitting: Internal Medicine

## 2021-09-25 ENCOUNTER — Encounter (HOSPITAL_COMMUNITY): Payer: HMO | Admitting: Internal Medicine

## 2021-09-25 ENCOUNTER — Telehealth: Payer: Self-pay

## 2021-09-25 ENCOUNTER — Encounter (HOSPITAL_COMMUNITY): Payer: Self-pay | Admitting: Gastroenterology

## 2021-09-25 DIAGNOSIS — M545 Low back pain, unspecified: Secondary | ICD-10-CM

## 2021-09-25 NOTE — Telephone Encounter (Signed)
Oxycodone  ?Back Spasms Med ?

## 2021-09-26 ENCOUNTER — Other Ambulatory Visit: Payer: Self-pay | Admitting: Internal Medicine

## 2021-09-26 DIAGNOSIS — D509 Iron deficiency anemia, unspecified: Secondary | ICD-10-CM | POA: Diagnosis not present

## 2021-09-26 DIAGNOSIS — D57219 Sickle-cell/Hb-C disease with crisis, unspecified: Secondary | ICD-10-CM | POA: Diagnosis not present

## 2021-09-26 DIAGNOSIS — D57819 Other sickle-cell disorders with crisis, unspecified: Secondary | ICD-10-CM | POA: Diagnosis not present

## 2021-09-26 DIAGNOSIS — Z992 Dependence on renal dialysis: Secondary | ICD-10-CM | POA: Diagnosis not present

## 2021-09-26 DIAGNOSIS — N2581 Secondary hyperparathyroidism of renal origin: Secondary | ICD-10-CM | POA: Diagnosis not present

## 2021-09-26 DIAGNOSIS — D571 Sickle-cell disease without crisis: Secondary | ICD-10-CM | POA: Diagnosis not present

## 2021-09-26 DIAGNOSIS — D631 Anemia in chronic kidney disease: Secondary | ICD-10-CM | POA: Diagnosis not present

## 2021-09-26 DIAGNOSIS — N186 End stage renal disease: Secondary | ICD-10-CM | POA: Diagnosis not present

## 2021-09-27 ENCOUNTER — Telehealth: Payer: Self-pay | Admitting: Internal Medicine

## 2021-09-27 NOTE — Telephone Encounter (Signed)
Hey Dr. Doreene Burke, Patrick [name] is coming in tomorrow, Thursday 4/13, and his Case Manager called requesting he has referral orders made for 8 weeks of custodial care from either [facility] or [facility]. Per case manager he will need physical therapy, home health aide, and education on medication management. She requested the Home Health referral be sent to both [facility] and [facility] in case one is not able to get him in right now.  ? ?If you have any questions you can call his case manager, Verdene Lennert, at [phone] ?

## 2021-09-28 ENCOUNTER — Ambulatory Visit (INDEPENDENT_AMBULATORY_CARE_PROVIDER_SITE_OTHER): Payer: HMO | Admitting: Nurse Practitioner

## 2021-09-28 ENCOUNTER — Encounter: Payer: Self-pay | Admitting: Nurse Practitioner

## 2021-09-28 ENCOUNTER — Other Ambulatory Visit: Payer: Self-pay

## 2021-09-28 VITALS — BP 107/72 | HR 120 | Temp 98.3°F | Ht 71.0 in | Wt 127.0 lb

## 2021-09-28 DIAGNOSIS — Z09 Encounter for follow-up examination after completed treatment for conditions other than malignant neoplasm: Secondary | ICD-10-CM | POA: Diagnosis not present

## 2021-09-28 DIAGNOSIS — G894 Chronic pain syndrome: Secondary | ICD-10-CM

## 2021-09-28 DIAGNOSIS — D571 Sickle-cell disease without crisis: Secondary | ICD-10-CM | POA: Diagnosis not present

## 2021-09-28 DIAGNOSIS — M545 Low back pain, unspecified: Secondary | ICD-10-CM | POA: Diagnosis not present

## 2021-09-28 MED ORDER — OXYCODONE HCL 5 MG PO CAPS: 10.0000 mg | ORAL_CAPSULE | Freq: Four times a day (QID) | ORAL | 0 refills | Status: AC | PRN

## 2021-09-28 NOTE — Assessment & Plan Note (Signed)
-   CBC ? ?2. Hb-SS disease without crisis ([place]) ? ?- CBC ? ?Discussed need for good hydration, monitoring of hydration status, avoidance of heat, cold, stress, and infection triggers. We discussed the need to be adherent with taking Hydrea and other home medications. Patient was reminded of the need to seek medical attention immediately if any symptom of bleeding, anemia, or infection occurs. ? ?3. Low back pain without sciatica, unspecified back pain laterality, unspecified chronicity ? ?Will reorder tizanidine ? ?Follow up: ? ?Follow up in 3 months or sooner if needed ?

## 2021-09-28 NOTE — Progress Notes (Signed)
? ?@Patient  ID: Patrick [name], male    DOB: [birth year], 66 y.o.   MRN: [id number] ? ?Chief Complaint  ?Patient presents with  ? Hospitalization Follow-up  ?  Pt is here for hospital follow up. Pt stated he still has lower back spasm   ? ? ?Referring provider: ?[name], MD ? ?HPI ? ?Patrick [name] 66 y.o. male  has a past medical history of Blood dyscrasia, Blood transfusion, CHF (congestive heart failure) ([place]), Chronic kidney disease (CKD), stage III (moderate) ([place]), Dialysis patient ([place]) (04/2019), Gout, Headache(784.0), Legally blind, Shortness of breath dyspnea, Sickle cell disease, type SS ([place]), Swelling abdomen, Swelling of extremity, left, and Swelling of extremity, right. To the [facility] for hospital follow up. ? ?Patient presents today for hospital follow-up.  He was admitted to the hospital on 329 for sickle cell crisis.  He was managed with IV Dilaudid.  He did have blood transfusion in hospital.  Overall patient states that he has been doing well since hospital discharge.  He does complain of mild back spasms and right knee pain today.  He is requesting a refill on his tizanidine today. Denies f/c/s, n/v/d, hemoptysis, chest pain or edema. ? ? ?No Known Allergies ? ?Immunization History  ?Administered Date(s) Administered  ? Influenza Inj Mdck Quad Pf 03/21/2018  ? Influenza,inj,Quad PF,6+ Mos 03/17/2013, 03/08/2014  ? PFIZER(Purple Top)SARS-COV-2 Vaccination 10/01/2019, 10/26/2019  ? Pneumococcal Conjugate-13 12/02/2014  ? Pneumococcal Polysaccharide-23 02/10/2013, 02/28/2021  ? Tdap 09/24/2013  ? Zoster Recombinat (Shingrix) 11/03/2020, 01/11/2021  ? ? ?Past Medical History:  ?Diagnosis Date  ? Blood dyscrasia   ? Blood transfusion   ? "I've had a bunch of them"  ? CHF (congestive heart failure) ([place])   ? followed by hf clinic  ? Chronic kidney disease (CKD), stage III (moderate) ([facility])   ? Dialysis patient [facility]) 04/2019  ? AV fistula (right arm)  ? Gout   ? Headache(784.0)   ?  "probably weekly" (01/13/2014)  ? Legally blind   ? Shortness of breath dyspnea   ? Sickle cell disease, type SS ([place])   ? Swelling abdomen   ? Swelling of extremity, left   ? Swelling of extremity, right   ? ? ?Tobacco History: ?Social History  ? ?Tobacco Use  ?Smoking Status Former  ? Packs/day: 0.50  ? Years: 41.00  ? Pack years: 20.50  ? Types: Cigarettes  ? Quit date: 12/08/2015  ? Years since quitting: 5.8  ?Smokeless Tobacco Never  ? ?Counseling given: Not Answered ? ? ?Outpatient Encounter Medications as of 09/28/2021  ?Medication Sig  ? acetaminophen (TYLENOL) 500 MG tablet Take 500 mg by mouth every 6 (six) hours as needed for moderate pain.  ? allopurinol (ZYLOPRIM) 100 MG tablet TAKE 1 TABLET BY MOUTH EVERY DAY (Patient taking differently: Take 100 mg by mouth daily.)  ? AURYXIA 1 GM 210 MG(Fe) tablet Take 210 mg by mouth 3 (three) times daily.  ? cinacalcet (SENSIPAR) 30 MG tablet Take 1 tablet (30 mg total) by mouth Every Tuesday,Thursday,and Saturday with dialysis.  ? fluticasone (FLONASE) 50 MCG/ACT nasal spray Place 2 sprays into both nostrils daily.  ? folic acid (FOLVITE) 1 MG tablet TAKE 1 TABLET BY MOUTH EVERY DAY (Patient taking differently: Take 1 mg by mouth daily.)  ? Glycerin-Hypromellose-PEG 400 (VISINE DRY EYE OP) Place 1 drop into both eyes as needed (dryness).  ? hydroxyurea (HYDREA) 500 MG capsule Take 1 capsule (500 mg total) by mouth daily. TAKE 1 CAPSULE BY  MOUTH EVERY DAY WITH FOOD TO MINIMIZE GI SIDE EFFECTS (Patient taking differently: Take 500 mg by mouth daily before breakfast.)  ? lidocaine-prilocaine (EMLA) cream Apply 1 application. topically Every Tuesday,Thursday,and Saturday with dialysis.  ? Nutritional Supplements (,FEEDING SUPPLEMENT, PROSOURCE PLUS) liquid Take 30 mLs by mouth 2 (two) times daily between meals.  ? oxycodone (OXY-IR) 5 MG capsule Take 2 capsules (10 mg total) by mouth every 6 (six) hours as needed for up to 15 days for pain.  ? pantoprazole (PROTONIX) 40  MG tablet TAKE 1 TABLET BY MOUTH TWICE A DAY  ? tiZANidine (ZANAFLEX) 4 MG tablet Take 0.5 tablets (2 mg total) by mouth every 8 (eight) hours as needed for muscle spasms.  ? torsemide (DEMADEX) 20 MG tablet Take 80 mg by mouth daily.  ? Vitamin D, Ergocalciferol, (DRISDOL) 1.25 MG (50000 UNIT) CAPS capsule TAKE 1 CAPSULE BY MOUTH ONE TIME PER WEEK (Patient taking differently: Take 50,000 Units by mouth once a week. Monday)  ? multivitamin (RENA-VIT) TABS tablet Take 1 tablet by mouth at bedtime. (Patient not taking: Reported on 09/28/2021)  ? ?No facility-administered encounter medications on file as of 09/28/2021.  ? ? ? ?Review of Systems ? ?Review of Systems  ?Constitutional: Negative.   ?HENT: Negative.    ?Cardiovascular: Negative.   ?Gastrointestinal: Negative.   ?Musculoskeletal:  Positive for arthralgias, back pain and myalgias.  ?Allergic/Immunologic: Negative.   ?Neurological: Negative.   ?Psychiatric/Behavioral: Negative.     ? ? ? ?Physical Exam ? ?BP 107/72 (BP Location: Left Arm, Patient Position: Sitting, Cuff Size: Normal)   Pulse (!) 120   Temp 98.3 ?F (36.8 ?C)   Ht 5\' 11"  (1.803 m)   Wt 127 lb (57.6 kg)   SpO2 100%   BMI 17.71 kg/m?  ? ?Wt Readings from Last 5 Encounters:  ?09/28/21 127 lb (57.6 kg)  ?09/17/21 139 lb 5.3 oz (63.2 kg)  ?09/04/21 146 lb 0.4 oz (66.2 kg)  ?08/29/21 121 lb 4.1 oz (55 kg)  ?08/11/21 115 lb 15.4 oz (52.6 kg)  ? ? ? ?Physical Exam ?Vitals and nursing note reviewed.  ?Constitutional:   ?   General: He is not in acute distress. ?   Appearance: He is well-developed.  ?Cardiovascular:  ?   Rate and Rhythm: Normal rate. Rhythm irregular.  ?   Comments: Chronic a-fib ?Pulmonary:  ?   Effort: Pulmonary effort is normal.  ?   Breath sounds: Normal breath sounds.  ?Skin: ?   General: Skin is warm and dry.  ?Neurological:  ?   Mental Status: He is alert and oriented to person, place, and time.  ? ? ? ?Lab Results: ? ?CBC ?   ?Component Value Date/Time  ? WBC 8.6 09/17/2021  0118  ? RBC 2.29 (L) 09/17/2021 0118  ? HGB 7.5 (L) 09/17/2021 0118  ? HGB 7.1 (L) 09/04/2021 1211  ? HCT 22.1 (L) 09/17/2021 0118  ? HCT 19.9 (L) 09/04/2021 1211  ? PLT 179 09/17/2021 0118  ? PLT 174 09/04/2021 1211  ? MCV 96.5 09/17/2021 0118  ? MCV 94 09/04/2021 1211  ? MCH 32.8 09/17/2021 0118  ? MCHC 33.9 09/17/2021 0118  ? RDW 24.5 (H) 09/17/2021 0118  ? RDW 22.4 (H) 09/04/2021 1211  ? LYMPHSABS 0.5 (L) 09/13/2021 1530  ? LYMPHSABS 0.8 09/04/2021 1211  ? MONOABS 0.4 09/13/2021 1530  ? EOSABS 0.4 09/13/2021 1530  ? EOSABS 0.0 09/04/2021 1211  ? BASOSABS 0.1 09/13/2021 1530  ? BASOSABS 0.1 09/04/2021 1211  ? ? ?  BMET ?   ?Component Value Date/Time  ? NA 133 (L) 09/16/2021 0141  ? NA 129 (L) 09/04/2021 1211  ? K 4.3 09/16/2021 0141  ? CL 97 (L) 09/16/2021 0141  ? CO2 24 09/16/2021 0141  ? GLUCOSE 95 09/16/2021 0141  ? BUN 43 (H) 09/16/2021 0141  ? BUN 34 (H) 09/04/2021 1211  ? CREATININE 5.25 (H) 09/16/2021 0141  ? CREATININE 2.90 (H) 08/11/2015 1359  ? CALCIUM 8.0 (L) 09/16/2021 0141  ? GFRNONAA 12 (L) 09/16/2021 0141  ? GFRNONAA 23 (L) 08/11/2015 1359  ? GFRAA 24 (L) 05/24/2020 1119  ? GFRAA 27 (L) 08/11/2015 1359  ? ? ?BNP ?   ?Component Value Date/Time  ? BNP 2,730.5 (H) 08/01/2020 2047  ? ? ?ProBNP ?   ?Component Value Date/Time  ? PROBNP 8,929.0 (H) 01/28/2014 1217  ? ? ?Imaging: ?CT ABDOMEN PELVIS W CONTRAST ? ?Result Date: 08/29/2021 ?CLINICAL DATA:  Abdominal pain, distension EXAM: CT ABDOMEN AND PELVIS WITH CONTRAST TECHNIQUE: Multidetector CT imaging of the abdomen and pelvis was performed using the standard protocol following bolus administration of intravenous contrast. RADIATION DOSE REDUCTION: This exam was performed according to the departmental dose-optimization program which includes automated exposure control, adjustment of the mA and/or kV according to patient size and/or use of iterative reconstruction technique. CONTRAST:  63mL OMNIPAQUE IOHEXOL 300 MG/ML  SOLN COMPARISON:  08/22/2021  FINDINGS: Lower chest: Stable trace bilateral pleural effusions. Stable cardiomegaly. Hepatobiliary: Cholecystectomy. No focal liver abnormalities. No biliary duct dilation. Gas within the common bile duct likely related

## 2021-09-28 NOTE — Patient Instructions (Addendum)
1. Hospital discharge follow-up ? ?- CBC ? ?2. Hb-SS disease without crisis ([place]) ? ?- CBC ? ?Discussed need for good hydration, monitoring of hydration status, avoidance of heat, cold, stress, and infection triggers. We discussed the need to be adherent with taking Hydrea and other home medications. Patient was reminded of the need to seek medical attention immediately if any symptom of bleeding, anemia, or infection occurs. ? ?3. Low back pain without sciatica, unspecified back pain laterality, unspecified chronicity ? ?Will reorder tizanidine ? ?Follow up: ? ?Follow up in 3 months or sooner if needed ? ? ? ?Sickle Cell Anemia, Adult ?Sickle cell anemia is a condition where your red blood cells are shaped like sickles. Red blood cells carry oxygen through the body. Sickle-shaped cells do not live as long as normal red blood cells. They also clump together and block blood from flowing through the blood vessels. This prevents the body from getting enough oxygen. Sickle cell anemia causes organ damage and pain. It also increases the risk of infection. ?Follow these instructions at home: ?Medicines ?Take over-the-counter and prescription medicines only as told by your doctor. ?If you were prescribed an antibiotic medicine, take it as told by your doctor. Do not stop taking the antibiotic even if you start to feel better. ?If you develop a fever, do not take medicines to lower the fever right away. Tell your doctor about the fever. ?Managing pain, stiffness, and swelling ?Try these methods to help with pain: ?Use a heating pad. ?Take a warm bath. ?Distract yourself, such as by watching TV. ?Eating and drinking ?Drink enough fluid to keep your pee (urine) clear or pale yellow. Drink more in hot weather and during exercise. ?Limit or avoid alcohol. ?Eat a healthy diet. Eat plenty of fruits, vegetables, whole grains, and lean protein. ?Take vitamins and supplements as told by your doctor. ?Traveling ?When traveling, keep  these with you: ?Your medical information. ?The names of your doctors. ?Your medicines. ?If you need to take an airplane, talk to your doctor first. ?Activity ?Rest often. ?Avoid exercises that make your heart beat much faster, such as jogging. ?General instructions ?Do not use products that have nicotine or tobacco, such as cigarettes and e-cigarettes. If you need help quitting, ask your doctor. ?Consider wearing a medical alert bracelet. ?Avoid being in high places (high altitudes), such as mountains. ?Avoid very hot or cold temperatures. ?Avoid places where the temperature changes a lot. ?Keep all follow-up visits as told by your doctor. This is important. ?Contact a doctor if: ?A joint hurts. ?Your feet or hands hurt or swell. ?You feel tired (fatigued). ?Get help right away if: ?You have symptoms of infection. These include: ?Fever. ?Chills. ?Being very tired. ?Irritability. ?Poor eating. ?Throwing up (vomiting). ?You feel dizzy or faint. ?You have new stomach pain, especially on the left side. ?You have a an erection (priapism) that lasts more than 4 hours. ?You have numbness in your arms or legs. ?You have a hard time moving your arms or legs. ?You have trouble talking. ?You have pain that does not go away when you take medicine. ?You are short of breath. ?You are breathing fast. ?You have a long-term cough. ?You have pain in your chest. ?You have a bad headache. ?You have a stiff neck. ?Your stomach looks bloated even though you did not eat much. ?Your skin is pale. ?You suddenly cannot see well. ?Summary ?Sickle cell anemia is a condition where your red blood cells are shaped like sickles. ?Follow your  doctor's advice on ways to manage pain, food to eat, activities to do, and steps to take for safe travel. ?Get medical help right away if you have any signs of infection, such as a fever. ?This information is not intended to replace advice given to you by your health care provider. Make sure you discuss any  questions you have with your health care provider. ?Document Revised: 10/29/2019 Document Reviewed: 10/29/2019 ?Elsevier Patient Education ? 2022 [employer]. ? ? ? ? ?

## 2021-09-29 LAB — CBC
Hematocrit: 24.6 % — ABNORMAL LOW (ref 37.5–51.0)
Hemoglobin: 8.2 g/dL — ABNORMAL LOW (ref 13.0–17.7)
MCH: 34.2 pg — ABNORMAL HIGH (ref 26.6–33.0)
MCHC: 33.3 g/dL (ref 31.5–35.7)
MCV: 103 fL — ABNORMAL HIGH (ref 79–97)
NRBC: 6 % — ABNORMAL HIGH (ref 0–0)
Platelets: 182 10*3/uL (ref 150–450)
RBC: 2.4 x10E6/uL — CL (ref 4.14–5.80)
RDW: 25 % — ABNORMAL HIGH (ref 11.6–15.4)
WBC: 8.2 10*3/uL (ref 3.4–10.8)

## 2021-10-03 DIAGNOSIS — Z992 Dependence on renal dialysis: Secondary | ICD-10-CM | POA: Diagnosis not present

## 2021-10-03 DIAGNOSIS — D57219 Sickle-cell/Hb-C disease with crisis, unspecified: Secondary | ICD-10-CM | POA: Diagnosis not present

## 2021-10-03 DIAGNOSIS — D631 Anemia in chronic kidney disease: Secondary | ICD-10-CM | POA: Diagnosis not present

## 2021-10-03 DIAGNOSIS — D57819 Other sickle-cell disorders with crisis, unspecified: Secondary | ICD-10-CM | POA: Diagnosis not present

## 2021-10-03 DIAGNOSIS — D509 Iron deficiency anemia, unspecified: Secondary | ICD-10-CM | POA: Diagnosis not present

## 2021-10-03 DIAGNOSIS — N2581 Secondary hyperparathyroidism of renal origin: Secondary | ICD-10-CM | POA: Diagnosis not present

## 2021-10-03 DIAGNOSIS — D571 Sickle-cell disease without crisis: Secondary | ICD-10-CM | POA: Diagnosis not present

## 2021-10-03 DIAGNOSIS — N186 End stage renal disease: Secondary | ICD-10-CM | POA: Diagnosis not present

## 2021-10-09 ENCOUNTER — Telehealth: Payer: Self-pay

## 2021-10-09 NOTE — Telephone Encounter (Signed)
Oxycodone  ? ?He also ask for his Back Spasm med to be refilled ?

## 2021-10-10 DIAGNOSIS — D57819 Other sickle-cell disorders with crisis, unspecified: Secondary | ICD-10-CM | POA: Diagnosis not present

## 2021-10-10 DIAGNOSIS — D631 Anemia in chronic kidney disease: Secondary | ICD-10-CM | POA: Diagnosis not present

## 2021-10-10 DIAGNOSIS — D57219 Sickle-cell/Hb-C disease with crisis, unspecified: Secondary | ICD-10-CM | POA: Diagnosis not present

## 2021-10-10 DIAGNOSIS — N186 End stage renal disease: Secondary | ICD-10-CM | POA: Diagnosis not present

## 2021-10-10 DIAGNOSIS — D509 Iron deficiency anemia, unspecified: Secondary | ICD-10-CM | POA: Diagnosis not present

## 2021-10-10 DIAGNOSIS — D571 Sickle-cell disease without crisis: Secondary | ICD-10-CM | POA: Diagnosis not present

## 2021-10-10 DIAGNOSIS — Z992 Dependence on renal dialysis: Secondary | ICD-10-CM | POA: Diagnosis not present

## 2021-10-10 DIAGNOSIS — N2581 Secondary hyperparathyroidism of renal origin: Secondary | ICD-10-CM | POA: Diagnosis not present

## 2021-10-15 DIAGNOSIS — I158 Other secondary hypertension: Secondary | ICD-10-CM | POA: Diagnosis not present

## 2021-10-15 DIAGNOSIS — N186 End stage renal disease: Secondary | ICD-10-CM | POA: Diagnosis not present

## 2021-10-15 DIAGNOSIS — Z992 Dependence on renal dialysis: Secondary | ICD-10-CM | POA: Diagnosis not present

## 2021-10-17 DIAGNOSIS — D631 Anemia in chronic kidney disease: Secondary | ICD-10-CM | POA: Diagnosis not present

## 2021-10-17 DIAGNOSIS — D57219 Sickle-cell/Hb-C disease with crisis, unspecified: Secondary | ICD-10-CM | POA: Diagnosis not present

## 2021-10-17 DIAGNOSIS — D57819 Other sickle-cell disorders with crisis, unspecified: Secondary | ICD-10-CM | POA: Diagnosis not present

## 2021-10-17 DIAGNOSIS — N186 End stage renal disease: Secondary | ICD-10-CM | POA: Diagnosis not present

## 2021-10-17 DIAGNOSIS — Z992 Dependence on renal dialysis: Secondary | ICD-10-CM | POA: Diagnosis not present

## 2021-10-17 DIAGNOSIS — D571 Sickle-cell disease without crisis: Secondary | ICD-10-CM | POA: Diagnosis not present

## 2021-10-17 DIAGNOSIS — N2581 Secondary hyperparathyroidism of renal origin: Secondary | ICD-10-CM | POA: Diagnosis not present

## 2021-10-20 ENCOUNTER — Other Ambulatory Visit: Payer: Self-pay | Admitting: Family Medicine

## 2021-10-20 ENCOUNTER — Telehealth: Payer: Self-pay

## 2021-10-20 DIAGNOSIS — E559 Vitamin D deficiency, unspecified: Secondary | ICD-10-CM

## 2021-10-20 DIAGNOSIS — D571 Sickle-cell disease without crisis: Secondary | ICD-10-CM

## 2021-10-20 MED ORDER — OXYCODONE HCL 10 MG PO TABS: 10.0000 mg | ORAL_TABLET | Freq: Four times a day (QID) | ORAL | 0 refills | Status: DC | PRN

## 2021-10-20 NOTE — Progress Notes (Signed)
Reviewed PDMP substance reporting system prior to prescribing opiate medications. No inconsistencies noted.  Meds ordered this encounter  Medications   Oxycodone HCl 10 MG TABS    Sig: Take 1 tablet (10 mg total) by mouth every 6 (six) hours as needed.    Dispense:  60 tablet    Refill:  0    Order Specific Question:   Supervising Provider    Answer:   JEGEDE, OLUGBEMIGA E [[id number]]   Patrick [name] Patrick Calkin  APRN, MSN, FNP-C [facility] [address]  [place], [place] [address] [phone]  

## 2021-10-23 ENCOUNTER — Telehealth: Payer: Self-pay | Admitting: Internal Medicine

## 2021-10-23 NOTE — Telephone Encounter (Signed)
Patient returned call requesting lab results. He states that the nurse left a VM for him to call back. Pt can be reached at [phone].  ?

## 2021-10-24 NOTE — Telephone Encounter (Signed)
Noted, thanks!

## 2021-10-26 DIAGNOSIS — D57219 Sickle-cell/Hb-C disease with crisis, unspecified: Secondary | ICD-10-CM | POA: Diagnosis not present

## 2021-10-26 DIAGNOSIS — L299 Pruritus, unspecified: Secondary | ICD-10-CM | POA: Diagnosis not present

## 2021-10-26 DIAGNOSIS — N186 End stage renal disease: Secondary | ICD-10-CM | POA: Diagnosis not present

## 2021-10-26 DIAGNOSIS — D571 Sickle-cell disease without crisis: Secondary | ICD-10-CM | POA: Diagnosis not present

## 2021-10-26 DIAGNOSIS — Z992 Dependence on renal dialysis: Secondary | ICD-10-CM | POA: Diagnosis not present

## 2021-10-26 DIAGNOSIS — D631 Anemia in chronic kidney disease: Secondary | ICD-10-CM | POA: Diagnosis not present

## 2021-10-26 DIAGNOSIS — N2581 Secondary hyperparathyroidism of renal origin: Secondary | ICD-10-CM | POA: Diagnosis not present

## 2021-10-26 DIAGNOSIS — D57819 Other sickle-cell disorders with crisis, unspecified: Secondary | ICD-10-CM | POA: Diagnosis not present

## 2021-10-29 ENCOUNTER — Other Ambulatory Visit: Payer: Self-pay | Admitting: Nurse Practitioner

## 2021-10-31 DIAGNOSIS — N186 End stage renal disease: Secondary | ICD-10-CM | POA: Diagnosis not present

## 2021-10-31 DIAGNOSIS — D57219 Sickle-cell/Hb-C disease with crisis, unspecified: Secondary | ICD-10-CM | POA: Diagnosis not present

## 2021-10-31 DIAGNOSIS — D571 Sickle-cell disease without crisis: Secondary | ICD-10-CM | POA: Diagnosis not present

## 2021-10-31 DIAGNOSIS — D57819 Other sickle-cell disorders with crisis, unspecified: Secondary | ICD-10-CM | POA: Diagnosis not present

## 2021-10-31 DIAGNOSIS — N2581 Secondary hyperparathyroidism of renal origin: Secondary | ICD-10-CM | POA: Diagnosis not present

## 2021-10-31 DIAGNOSIS — D509 Iron deficiency anemia, unspecified: Secondary | ICD-10-CM | POA: Diagnosis not present

## 2021-10-31 DIAGNOSIS — D631 Anemia in chronic kidney disease: Secondary | ICD-10-CM | POA: Diagnosis not present

## 2021-10-31 DIAGNOSIS — Z992 Dependence on renal dialysis: Secondary | ICD-10-CM | POA: Diagnosis not present

## 2021-11-03 ENCOUNTER — Telehealth (HOSPITAL_COMMUNITY): Payer: Self-pay | Admitting: *Deleted

## 2021-11-03 ENCOUNTER — Ambulatory Visit: Payer: Self-pay

## 2021-11-03 DIAGNOSIS — Z515 Encounter for palliative care: Secondary | ICD-10-CM | POA: Diagnosis not present

## 2021-11-03 DIAGNOSIS — I482 Chronic atrial fibrillation, unspecified: Secondary | ICD-10-CM | POA: Diagnosis not present

## 2021-11-03 NOTE — Telephone Encounter (Signed)
Carla,NP with [facility] requested a direct call from a provider to discuss patient. She did not leave details.  Call back # [phone]  Routed to [facility]

## 2021-11-03 NOTE — Telephone Encounter (Signed)
Patrick [name] wanting to report pt in afib. Was transferred to [place]. Pt is not a pt at [facility]. Transferred call to Patrick [name] .

## 2021-11-06 DIAGNOSIS — D57219 Sickle-cell/Hb-C disease with crisis, unspecified: Secondary | ICD-10-CM | POA: Diagnosis not present

## 2021-11-06 DIAGNOSIS — N2581 Secondary hyperparathyroidism of renal origin: Secondary | ICD-10-CM | POA: Diagnosis not present

## 2021-11-06 DIAGNOSIS — D571 Sickle-cell disease without crisis: Secondary | ICD-10-CM | POA: Diagnosis not present

## 2021-11-06 DIAGNOSIS — D631 Anemia in chronic kidney disease: Secondary | ICD-10-CM | POA: Diagnosis not present

## 2021-11-06 DIAGNOSIS — D57819 Other sickle-cell disorders with crisis, unspecified: Secondary | ICD-10-CM | POA: Diagnosis not present

## 2021-11-06 DIAGNOSIS — Z992 Dependence on renal dialysis: Secondary | ICD-10-CM | POA: Diagnosis not present

## 2021-11-06 DIAGNOSIS — N186 End stage renal disease: Secondary | ICD-10-CM | POA: Diagnosis not present

## 2021-11-07 ENCOUNTER — Non-Acute Institutional Stay (HOSPITAL_COMMUNITY)
Admission: RE | Admit: 2021-11-07 | Discharge: 2021-11-07 | Disposition: A | Discharge status: Home / Self Care | Payer: HMO | Source: Ambulatory Visit | Attending: Internal Medicine | Admitting: Internal Medicine

## 2021-11-07 VITALS — BP 112/75 | HR 104 | Temp 97.6°F | Resp 16

## 2021-11-07 DIAGNOSIS — D571 Sickle-cell disease without crisis: Secondary | ICD-10-CM | POA: Insufficient documentation

## 2021-11-07 DIAGNOSIS — D649 Anemia, unspecified: Secondary | ICD-10-CM

## 2021-11-07 LAB — PREPARE RBC (CROSSMATCH)

## 2021-11-07 MED ORDER — SODIUM CHLORIDE 0.9% IV SOLUTION: Freq: Once | INTRAVENOUS | Status: AC

## 2021-11-07 NOTE — Progress Notes (Signed)
PATIENT CARE CENTER NOTE   Diagnosis: Sickle Cell Anemia    Provider: Corliss Parish, MD   Procedure: 1 unit blood    Note:  Patient received 1 unit of PRBC's via PIV. Patient tolerated transfusion well with no adverse reaction. Vital signs remained stable. Discharge instructions given. Patient alert, oriented and ambulatory at discharge.

## 2021-11-08 LAB — TYPE AND SCREEN
ABO/RH(D): AB POS
Antibody Screen: NEGATIVE
Unit division: 0

## 2021-11-08 LAB — BPAM RBC
Blood Product Expiration Date: 202306232359
ISSUE DATE / TIME: 202305231115
Unit Type and Rh: 5100

## 2021-11-09 ENCOUNTER — Telehealth (HOSPITAL_COMMUNITY): Payer: Self-pay | Admitting: *Deleted

## 2021-11-09 NOTE — Telephone Encounter (Signed)
Patient called stating his heart rate has been elevated during dialysis. Pt reports heart rate today 124. Pt said dialysis nurse asked him to contact our office.   Routed to [employer]

## 2021-11-09 NOTE — Telephone Encounter (Signed)
I called pt no answer/Left vm for return call

## 2021-11-14 DIAGNOSIS — N2581 Secondary hyperparathyroidism of renal origin: Secondary | ICD-10-CM | POA: Diagnosis not present

## 2021-11-14 DIAGNOSIS — Z992 Dependence on renal dialysis: Secondary | ICD-10-CM | POA: Diagnosis not present

## 2021-11-14 DIAGNOSIS — D57819 Other sickle-cell disorders with crisis, unspecified: Secondary | ICD-10-CM | POA: Diagnosis not present

## 2021-11-14 DIAGNOSIS — N186 End stage renal disease: Secondary | ICD-10-CM | POA: Diagnosis not present

## 2021-11-14 DIAGNOSIS — D571 Sickle-cell disease without crisis: Secondary | ICD-10-CM | POA: Diagnosis not present

## 2021-11-14 DIAGNOSIS — D57219 Sickle-cell/Hb-C disease with crisis, unspecified: Secondary | ICD-10-CM | POA: Diagnosis not present

## 2021-11-15 DIAGNOSIS — Z992 Dependence on renal dialysis: Secondary | ICD-10-CM | POA: Diagnosis not present

## 2021-11-15 DIAGNOSIS — N186 End stage renal disease: Secondary | ICD-10-CM | POA: Diagnosis not present

## 2021-11-15 DIAGNOSIS — I158 Other secondary hypertension: Secondary | ICD-10-CM | POA: Diagnosis not present

## 2021-11-16 ENCOUNTER — Other Ambulatory Visit: Payer: Self-pay | Admitting: Nurse Practitioner

## 2021-11-16 DIAGNOSIS — N186 End stage renal disease: Secondary | ICD-10-CM | POA: Diagnosis not present

## 2021-11-16 DIAGNOSIS — Z992 Dependence on renal dialysis: Secondary | ICD-10-CM | POA: Diagnosis not present

## 2021-11-16 DIAGNOSIS — D631 Anemia in chronic kidney disease: Secondary | ICD-10-CM | POA: Diagnosis not present

## 2021-11-16 DIAGNOSIS — D571 Sickle-cell disease without crisis: Secondary | ICD-10-CM | POA: Diagnosis not present

## 2021-11-16 DIAGNOSIS — D57219 Sickle-cell/Hb-C disease with crisis, unspecified: Secondary | ICD-10-CM | POA: Diagnosis not present

## 2021-11-16 DIAGNOSIS — D57819 Other sickle-cell disorders with crisis, unspecified: Secondary | ICD-10-CM | POA: Diagnosis not present

## 2021-11-16 DIAGNOSIS — N2581 Secondary hyperparathyroidism of renal origin: Secondary | ICD-10-CM | POA: Diagnosis not present

## 2021-11-21 ENCOUNTER — Inpatient Hospital Stay (HOSPITAL_COMMUNITY)
Admission: EM | Admit: 2021-11-21 | Discharge: 2021-11-24 | DRG: 811 | Disposition: A | Discharge status: Home / Self Care | Payer: HMO | Attending: Internal Medicine | Admitting: Internal Medicine

## 2021-11-21 ENCOUNTER — Emergency Department (HOSPITAL_COMMUNITY)
Admission: EM | Admit: 2021-11-21 | Discharge: 2021-11-21 | Disposition: A | Discharge status: Home / Self Care | Payer: HMO | Source: Home / Self Care | Attending: Emergency Medicine | Admitting: Emergency Medicine

## 2021-11-21 ENCOUNTER — Emergency Department (HOSPITAL_COMMUNITY): Payer: HMO

## 2021-11-21 ENCOUNTER — Other Ambulatory Visit: Payer: Self-pay

## 2021-11-21 ENCOUNTER — Encounter (HOSPITAL_COMMUNITY): Payer: Self-pay

## 2021-11-21 DIAGNOSIS — I132 Hypertensive heart and chronic kidney disease with heart failure and with stage 5 chronic kidney disease, or end stage renal disease: Secondary | ICD-10-CM | POA: Diagnosis present

## 2021-11-21 DIAGNOSIS — I10 Essential (primary) hypertension: Secondary | ICD-10-CM | POA: Diagnosis not present

## 2021-11-21 DIAGNOSIS — Z87891 Personal history of nicotine dependence: Secondary | ICD-10-CM

## 2021-11-21 DIAGNOSIS — N2581 Secondary hyperparathyroidism of renal origin: Secondary | ICD-10-CM | POA: Diagnosis present

## 2021-11-21 DIAGNOSIS — Z992 Dependence on renal dialysis: Secondary | ICD-10-CM | POA: Diagnosis not present

## 2021-11-21 DIAGNOSIS — I4821 Permanent atrial fibrillation: Secondary | ICD-10-CM | POA: Diagnosis present

## 2021-11-21 DIAGNOSIS — Z811 Family history of alcohol abuse and dependence: Secondary | ICD-10-CM | POA: Diagnosis not present

## 2021-11-21 DIAGNOSIS — I953 Hypotension of hemodialysis: Secondary | ICD-10-CM | POA: Diagnosis not present

## 2021-11-21 DIAGNOSIS — N186 End stage renal disease: Secondary | ICD-10-CM | POA: Diagnosis present

## 2021-11-21 DIAGNOSIS — Z9049 Acquired absence of other specified parts of digestive tract: Secondary | ICD-10-CM | POA: Diagnosis not present

## 2021-11-21 DIAGNOSIS — Z79899 Other long term (current) drug therapy: Secondary | ICD-10-CM | POA: Diagnosis not present

## 2021-11-21 DIAGNOSIS — G894 Chronic pain syndrome: Secondary | ICD-10-CM | POA: Diagnosis present

## 2021-11-21 DIAGNOSIS — D5700 Hb-SS disease with crisis, unspecified: Secondary | ICD-10-CM | POA: Diagnosis not present

## 2021-11-21 DIAGNOSIS — Z91158 Patient's noncompliance with renal dialysis for other reason: Secondary | ICD-10-CM | POA: Diagnosis not present

## 2021-11-21 DIAGNOSIS — I272 Pulmonary hypertension, unspecified: Secondary | ICD-10-CM | POA: Diagnosis present

## 2021-11-21 DIAGNOSIS — I428 Other cardiomyopathies: Secondary | ICD-10-CM | POA: Diagnosis present

## 2021-11-21 DIAGNOSIS — I9589 Other hypotension: Secondary | ICD-10-CM | POA: Diagnosis present

## 2021-11-21 DIAGNOSIS — D631 Anemia in chronic kidney disease: Secondary | ICD-10-CM | POA: Diagnosis present

## 2021-11-21 DIAGNOSIS — H548 Legal blindness, as defined in USA: Secondary | ICD-10-CM | POA: Diagnosis present

## 2021-11-21 DIAGNOSIS — I700 Atherosclerosis of aorta: Secondary | ICD-10-CM | POA: Diagnosis not present

## 2021-11-21 DIAGNOSIS — I509 Heart failure, unspecified: Secondary | ICD-10-CM | POA: Insufficient documentation

## 2021-11-21 DIAGNOSIS — R079 Chest pain, unspecified: Secondary | ICD-10-CM | POA: Diagnosis not present

## 2021-11-21 DIAGNOSIS — M545 Low back pain, unspecified: Secondary | ICD-10-CM

## 2021-11-21 DIAGNOSIS — Z681 Body mass index (BMI) 19 or less, adult: Secondary | ICD-10-CM

## 2021-11-21 DIAGNOSIS — K219 Gastro-esophageal reflux disease without esophagitis: Secondary | ICD-10-CM | POA: Diagnosis present

## 2021-11-21 DIAGNOSIS — Z79891 Long term (current) use of opiate analgesic: Secondary | ICD-10-CM

## 2021-11-21 DIAGNOSIS — E43 Unspecified severe protein-calorie malnutrition: Secondary | ICD-10-CM | POA: Diagnosis present

## 2021-11-21 DIAGNOSIS — Z8249 Family history of ischemic heart disease and other diseases of the circulatory system: Secondary | ICD-10-CM

## 2021-11-21 DIAGNOSIS — I120 Hypertensive chronic kidney disease with stage 5 chronic kidney disease or end stage renal disease: Secondary | ICD-10-CM | POA: Diagnosis not present

## 2021-11-21 DIAGNOSIS — E875 Hyperkalemia: Secondary | ICD-10-CM | POA: Diagnosis present

## 2021-11-21 DIAGNOSIS — E871 Hypo-osmolality and hyponatremia: Secondary | ICD-10-CM | POA: Diagnosis present

## 2021-11-21 DIAGNOSIS — R000 Tachycardia, unspecified: Secondary | ICD-10-CM | POA: Diagnosis present

## 2021-11-21 DIAGNOSIS — M898X9 Other specified disorders of bone, unspecified site: Secondary | ICD-10-CM | POA: Diagnosis present

## 2021-11-21 DIAGNOSIS — D571 Sickle-cell disease without crisis: Secondary | ICD-10-CM

## 2021-11-21 DIAGNOSIS — D57419 Sickle-cell thalassemia with crisis, unspecified: Principal | ICD-10-CM | POA: Diagnosis present

## 2021-11-21 DIAGNOSIS — D57418 Sickle-cell thalassemia, unspecified, with crisis with other specified complication: Secondary | ICD-10-CM | POA: Diagnosis not present

## 2021-11-21 DIAGNOSIS — N250 Renal osteodystrophy: Secondary | ICD-10-CM | POA: Diagnosis not present

## 2021-11-21 LAB — I-STAT CHEM 8, ED
BUN: 59 mg/dL — ABNORMAL HIGH (ref 8–23)
Calcium, Ion: 1.03 mmol/L — ABNORMAL LOW (ref 1.15–1.40)
Chloride: 96 mmol/L — ABNORMAL LOW (ref 98–111)
Creatinine, Ser: 7.5 mg/dL — ABNORMAL HIGH (ref 0.61–1.24)
Glucose, Bld: 104 mg/dL — ABNORMAL HIGH (ref 70–99)
HCT: 30 % — ABNORMAL LOW (ref 39.0–52.0)
Hemoglobin: 10.2 g/dL — ABNORMAL LOW (ref 13.0–17.0)
Potassium: 5.3 mmol/L — ABNORMAL HIGH (ref 3.5–5.1)
Sodium: 130 mmol/L — ABNORMAL LOW (ref 135–145)
TCO2: 27 mmol/L (ref 22–32)

## 2021-11-21 LAB — CBC WITH DIFFERENTIAL/PLATELET
Abs Immature Granulocytes: 0.26 10*3/uL — ABNORMAL HIGH (ref 0.00–0.07)
Basophils Absolute: 0.1 10*3/uL (ref 0.0–0.1)
Basophils Relative: 2 %
Eosinophils Absolute: 0.5 10*3/uL (ref 0.0–0.5)
Eosinophils Relative: 7 %
HCT: 22.4 % — ABNORMAL LOW (ref 39.0–52.0)
Hemoglobin: 8.3 g/dL — ABNORMAL LOW (ref 13.0–17.0)
Immature Granulocytes: 4 %
Lymphocytes Relative: 10 %
Lymphs Abs: 0.6 10*3/uL — ABNORMAL LOW (ref 0.7–4.0)
MCH: 37.9 pg — ABNORMAL HIGH (ref 26.0–34.0)
MCHC: 37.1 g/dL — ABNORMAL HIGH (ref 30.0–36.0)
MCV: 102.3 fL — ABNORMAL HIGH (ref 80.0–100.0)
Monocytes Absolute: 0.8 10*3/uL (ref 0.1–1.0)
Monocytes Relative: 12 %
Neutro Abs: 4.2 10*3/uL (ref 1.7–7.7)
Neutrophils Relative %: 65 %
Platelets: 192 10*3/uL (ref 150–400)
RBC: 2.19 MIL/uL — ABNORMAL LOW (ref 4.22–5.81)
RDW: 18.6 % — ABNORMAL HIGH (ref 11.5–15.5)
WBC: 6.5 10*3/uL (ref 4.0–10.5)
nRBC: 4.1 % — ABNORMAL HIGH (ref 0.0–0.2)

## 2021-11-21 LAB — RETICULOCYTES
Immature Retic Fract: 36.4 % — ABNORMAL HIGH (ref 2.3–15.9)
RBC.: 2.18 MIL/uL — ABNORMAL LOW (ref 4.22–5.81)
Retic Count, Absolute: 104 10*3/uL (ref 19.0–186.0)
Retic Ct Pct: 4.8 % — ABNORMAL HIGH (ref 0.4–3.1)

## 2021-11-21 MED ORDER — FENTANYL CITRATE PF 50 MCG/ML IJ SOSY
50.0000 ug | PREFILLED_SYRINGE | Freq: Once | INTRAMUSCULAR | Status: AC
Start: 2021-11-21 — End: 2021-11-21
  Administered 2021-11-21: 50 ug via INTRAMUSCULAR
  Filled 2021-11-21: qty 1

## 2021-11-21 MED ORDER — OXYCODONE HCL 5 MG PO TABS
10.0000 mg | ORAL_TABLET | ORAL | Status: AC
  Administered 2021-11-21: 10 mg via ORAL
  Filled 2021-11-21: qty 2

## 2021-11-21 MED ORDER — OXYCODONE HCL 5 MG PO TABS
15.0000 mg | ORAL_TABLET | ORAL | Status: AC
  Administered 2021-11-21: 15 mg via ORAL
  Filled 2021-11-21: qty 3

## 2021-11-21 NOTE — ED Provider Triage Note (Cosign Needed Addendum)
Emergency Medicine Provider Triage Evaluation Note  Patrick [name] , a 66 y.o. male  was evaluated in triage.  Pt complains of pain in arms, chest, back, legs started last night.  Patient has known sickle cell but states this pain is worse than normal.  He took Tylenol Extra Strength at home with minimal relief.  He states he does not have narcotics.  He had a dialysis appointment earlier this morning but states he did not have a ride so he could not go.  Denies shortness of breath, fever, abdominal pain, nausea/vomiting, palpitations.  Review of Systems  Positive: See above Negative:   Physical Exam  BP 120/81 (BP Location: Left Arm)   Pulse 88   Resp 20   Ht 5\' 11"  (1.803 m)   Wt 60.8 kg   SpO2 98%   BMI 18.69 kg/m  Gen:   Awake, no distress   Resp:  Normal effort  MSK:   Moves extremities without difficulty  Other:  Lungs clear to auscultations.  No murmurs, rubs  Medical Decision Making  Medically screening exam initiated at 7:14 PM.  Appropriate orders placed.  Adael D Tomasik was informed that the remainder of the evaluation will be completed by another provider, this initial triage assessment does not replace that evaluation, and the importance of remaining in the ED until their evaluation is complete.  Basic labs drawn earlier today.  See results section.   Wilnette Kales, [place] 11/21/21 1916    Wilnette Kales, [place] 11/21/21 [phone]

## 2021-11-21 NOTE — ED Triage Notes (Signed)
Pt was just discharged for a sickle cell crisis and told to go straight to dialysis and to follow up with his doctor about getting pain medication. Pt is back stating he has pain all over. Pt states he did not go to dialysis and his doctor wont help him for another 72 hrs they said.

## 2021-11-21 NOTE — Discharge Instructions (Signed)
Please contact Dr. Doreene Burke regarding your pain medicine Please go directly to dialysis Return if you are having fever, chest pain, difficulty breathing or other new symptoms

## 2021-11-21 NOTE — ED Provider Notes (Signed)
[facility] EMERGENCY DEPARTMENT Provider Note   CSN: [id number] Arrival date & time: 11/21/21  5400     History {Add pertinent medical, surgical, social history, OB history to HPI:1} Chief Complaint  Patient presents with   Sickle Cell Pain Crisis    Patrick [name] is a 66 y.o. male.  HPI 66 year old male history of sickle cell thalassemia, hypertension, CHF, end-stage renal disease on dialysis, anemia, chronic A-fib not on anticoagulation, AV malformation with bleeding, presents today with diffuse pain that he identifies as his sickle cell pain.  He denies any focal symptoms, fever, chills, trauma, substernal chest pain, nausea, or vomiting.  Reports that he has run out of his oxycodone.      Home Medications Prior to Admission medications   Medication Sig Start Date End Date Taking? Authorizing Provider  acetaminophen (TYLENOL) 500 MG tablet Take 500 mg by mouth every 6 (six) hours as needed for moderate pain.    [provider]  allopurinol (ZYLOPRIM) 100 MG tablet Take 1 tablet (100 mg total) by mouth daily. 11/08/21   Dorena Dew, FNP  AURYXIA 1 GM 210 MG(Fe) tablet Take 210 mg by mouth 3 (three) times daily. 02/11/21   [provider]  cinacalcet (SENSIPAR) 30 MG tablet Take 1 tablet (30 mg total) by mouth Every Tuesday,Thursday,and Saturday with dialysis. 08/31/21 11/29/21  Dahal, Marlowe Aschoff, MD  fluticasone (FLONASE) 50 MCG/ACT nasal spray Place 2 sprays into both nostrils daily. 09/04/21   Passmore, Jake Church I, NP  folic acid (FOLVITE) 1 MG tablet TAKE 1 TABLET BY MOUTH EVERY DAY Patient taking differently: Take 1 mg by mouth daily. 02/22/21   Dorena Dew, FNP  Glycerin-Hypromellose-PEG 400 (VISINE DRY EYE OP) Place 1 drop into both eyes as needed (dryness).    [provider]  hydroxyurea (HYDREA) 500 MG capsule Take 1 capsule (500 mg total) by mouth daily. 11/08/21   Dorena Dew, FNP  lidocaine-prilocaine (EMLA) cream  Apply 1 application. topically Every Tuesday,Thursday,and Saturday with dialysis. 09/08/19   [provider]  multivitamin (RENA-VIT) TABS tablet Take 1 tablet by mouth at bedtime. Patient not taking: Reported on 09/28/2021 08/30/21 11/28/21  Terrilee Croak, MD  Nutritional Supplements (,FEEDING SUPPLEMENT, PROSOURCE PLUS) liquid Take 30 mLs by mouth 2 (two) times daily between meals. 08/30/21   Terrilee Croak, MD  Oxycodone HCl 10 MG TABS Take 1 tablet (10 mg total) by mouth every 6 (six) hours as needed. 10/20/21   Dorena Dew, FNP  pantoprazole (PROTONIX) 40 MG tablet TAKE 1 TABLET BY MOUTH TWICE A DAY 11/16/21   Willia Craze, NP  tiZANidine (ZANAFLEX) 4 MG tablet Take 0.5 tablets (2 mg total) by mouth every 8 (eight) hours as needed for muscle spasms. 07/18/21   Dorena Dew, FNP  torsemide (DEMADEX) 20 MG tablet Take 80 mg by mouth daily. 09/03/21   [provider]  Vitamin D, Ergocalciferol, (DRISDOL) 1.25 MG (50000 UNIT) CAPS capsule Take 1 capsule (50,000 Units total) by mouth once a week. Monday 11/08/21   Dorena Dew, FNP      Allergies    Patient has no known allergies.    Review of Systems   Review of Systems  Physical Exam Updated Vital Signs BP 106/69 (BP Location: Left Arm)   Pulse 85   Temp 98.7 F (37.1 C)   Resp 16   SpO2 97%  Physical Exam Vitals and nursing note reviewed.  HENT:     Right Ear:  External ear normal.     Left Ear: External ear normal.     Mouth/Throat:     Pharynx: Oropharynx is clear.  Eyes:     Extraocular Movements: Extraocular movements intact.     Pupils: Pupils are equal, round, and reactive to light.  Cardiovascular:     Rate and Rhythm: Normal rate. Rhythm irregular.  Pulmonary:     Effort: Pulmonary effort is normal.     Breath sounds: Normal breath sounds.  Abdominal:     General: There is distension.     Comments: Healing midline abdominal wound with 2 cm.  Area healing by secondary intention   Musculoskeletal:        General: Normal range of motion.     Cervical back: Normal range of motion.  Skin:    General: Skin is warm.     Capillary Refill: Capillary refill takes less than 2 seconds.  Neurological:     General: No focal deficit present.     Mental Status: He is alert.  Psychiatric:        Mood and Affect: Mood normal.    ED Results / Procedures / Treatments   Labs (all labs ordered are listed, but only abnormal results are displayed) Labs Reviewed  RETICULOCYTES - Abnormal; Notable for the following components:      Result Value   Retic Ct Pct 4.8 (*)    RBC. 2.18 (*)    Immature Retic Fract 36.4 (*)    All other components within normal limits  CBC WITH DIFFERENTIAL/PLATELET - Abnormal; Notable for the following components:   RBC 2.19 (*)    Hemoglobin 8.3 (*)    HCT 22.4 (*)    MCV 102.3 (*)    MCH 37.9 (*)    MCHC 37.1 (*)    RDW 18.6 (*)    nRBC 4.1 (*)    Lymphs Abs 0.6 (*)    Abs Immature Granulocytes 0.26 (*)    All other components within normal limits    EKG None  Radiology DG Chest Port 1 View  Result Date: 11/21/2021 CLINICAL DATA:  Sickle cell crisis.  Chest pain. EXAM: PORTABLE CHEST 1 VIEW COMPARISON:  09/13/2021 FINDINGS: Stable cardiac enlargement. Aortic atherosclerosis. Chronic interstitial coarsening is identified bilaterally. No superimposed pleural effusion, interstitial edema or airspace consolidation. The visualized osseous structures are unremarkable. IMPRESSION: 1. No acute cardiopulmonary abnormalities. 2. Chronic interstitial coarsening. Electronically Signed   By: Kerby Moors M.D.   On: 11/21/2021 07:15    Procedures Procedures  {Document cardiac monitor, telemetry assessment procedure when appropriate:1}  Medications Ordered in ED Medications  oxyCODONE (Oxy IR/ROXICODONE) immediate release tablet 15 mg (has no administration in time range)    ED Course/ Medical Decision Making/ A&P Clinical Course as of 11/21/21  0916  Tue Nov 21, 2021  0911 CBC WITH DIFFERENTIAL(!) CBC reviewed interpreted with normal white blood cell count, hemoglobin is decreased consistent with prior and stable, platelets are normal at 192,000 [DR]  0911 I-stat chem 8, ED (not at [facility] or [facility])(!) I-STAT reviewed and interpreted and consistent with patient's renal failure with mild hyperkalemia at 5.3 creatinine elevated at 7.5 [DR]  0916 I-stat chem 8, ED (not at [facility] or [facility])(!) I-STAT reviewed interpreted with potassium of 5.3 Creatinine at 7.5 consistent with patient's renal failure [DR]    Clinical Course User Index [DR] Pattricia Boss, MD  Medical Decision Making 66 year old male sickle cell thalassemia anemia presents today complaining of diffuse pain consistent with his [place] pain and is out of his oxycodone.  Patient evaluated here with labs and has a stable anemia.  Reticulocyte count is distant with his ongoing anemia Patient presents today with diffuse pain.  I suspect this is secondary to being out of his oxycodone.  He is given 15 of oxycodone here and states he is still having pain.  Is given additional 10.  No signs or symptoms of infection or localized pain or acute chest syndrome or noted on my evaluation Patient's anemia is stable Patient with known end-stage renal disease due for dialysis today.  Not appear to be significantly volume overloaded.  However, his potassium is elevated. It is calling his dialysis center to see if he is able to come in for dialysis today   Amount and/or Complexity of Data Reviewed Labs:  Decision-making details documented in ED Course.  Risk Prescription drug management.   ***  {Document critical care time when appropriate:1} {Document review of labs and clinical decision tools ie heart score, Chads2Vasc2 etc:1}  {Document your independent review of radiology images, and any outside records:1} {Document your discussion with family members, caretakers,  and with consultants:1} {Document social determinants of health affecting pt's care:1} {Document your decision making why or why not admission, treatments were needed:1} Final Clinical Impression(s) / ED Diagnoses Final diagnoses:  None    Rx / DC Orders ED Discharge Orders     None

## 2021-11-21 NOTE — ED Triage Notes (Signed)
Pt reports that he began to have a sickle cell crisis last night with pain all over.

## 2021-11-21 NOTE — ED Provider Triage Note (Signed)
Emergency Medicine Provider Triage Evaluation Note  Patrick [name] , a 66 y.o. male  was evaluated in triage.  Pt complains of SCC that started overnight. Pain in arms, chest, and back. Pain typically in back. Tylenol extra strength at home, does not have narcotics. NO SOB, palpitations, fevers or chills.   Review of Systems  Positive: CP, arm pain, back pain Negative: Fever, chills, N/V/D  Physical Exam  BP 105/80   Pulse 85   Temp (!) 97.5 F (36.4 C) (Oral)   Resp 18   SpO2 100%  Gen:   Awake, no distress   Resp:  Normal effort  MSK:   Moves extremities without difficulty  Other:  RRR no m/r/g. Lungs CTAB.  Medical Decision Making  Medically screening exam initiated at 6:42 AM.  Appropriate orders placed.  Patrick [name] was informed that the remainder of the evaluation will be completed by another provider, this initial triage assessment does not replace that evaluation, and the importance of remaining in the ED until their evaluation is complete.  This chart was dictated using voice recognition software, Dragon. Despite the best efforts of this provider to proofread and correct errors, errors may still occur which can change documentation meaning.    Emeline Darling, PA-C 11/21/21 [phone]

## 2021-11-22 DIAGNOSIS — E875 Hyperkalemia: Secondary | ICD-10-CM

## 2021-11-22 DIAGNOSIS — D5700 Hb-SS disease with crisis, unspecified: Secondary | ICD-10-CM | POA: Diagnosis present

## 2021-11-22 LAB — RENAL FUNCTION PANEL
Albumin: 3.1 g/dL — ABNORMAL LOW (ref 3.5–5.0)
Anion gap: 18 — ABNORMAL HIGH (ref 5–15)
BUN: 74 mg/dL — ABNORMAL HIGH (ref 8–23)
CO2: 20 mmol/L — ABNORMAL LOW (ref 22–32)
Calcium: 8.6 mg/dL — ABNORMAL LOW (ref 8.9–10.3)
Chloride: 92 mmol/L — ABNORMAL LOW (ref 98–111)
Creatinine, Ser: 7.23 mg/dL — ABNORMAL HIGH (ref 0.61–1.24)
GFR, Estimated: 8 mL/min — ABNORMAL LOW (ref >60)
Glucose, Bld: 97 mg/dL (ref 70–99)
Phosphorus: 6.8 mg/dL — ABNORMAL HIGH (ref 2.5–4.6)
Potassium: 6.6 mmol/L (ref 3.5–5.1)
Sodium: 130 mmol/L — ABNORMAL LOW (ref 135–145)

## 2021-11-22 LAB — CBC WITH DIFFERENTIAL/PLATELET
Abs Immature Granulocytes: 0.15 10*3/uL — ABNORMAL HIGH (ref 0.00–0.07)
Abs Immature Granulocytes: 0.15 10*3/uL — ABNORMAL HIGH (ref 0.00–0.07)
Basophils Absolute: 0.1 10*3/uL (ref 0.0–0.1)
Basophils Absolute: 0.1 10*3/uL (ref 0.0–0.1)
Basophils Relative: 2 %
Basophils Relative: 1 %
Eosinophils Absolute: 0.2 10*3/uL (ref 0.0–0.5)
Eosinophils Absolute: 0.2 10*3/uL (ref 0.0–0.5)
Eosinophils Relative: 3 %
Eosinophils Relative: 2 %
HCT: 25.2 % — ABNORMAL LOW (ref 39.0–52.0)
HCT: 21.9 % — ABNORMAL LOW (ref 39.0–52.0)
Hemoglobin: 9 g/dL — ABNORMAL LOW (ref 13.0–17.0)
Hemoglobin: 7.9 g/dL — ABNORMAL LOW (ref 13.0–17.0)
Immature Granulocytes: 2 %
Immature Granulocytes: 2 %
Lymphocytes Relative: 7 %
Lymphocytes Relative: 6 %
Lymphs Abs: 0.5 10*3/uL — ABNORMAL LOW (ref 0.7–4.0)
Lymphs Abs: 0.5 10*3/uL — ABNORMAL LOW (ref 0.7–4.0)
MCH: 37 pg — ABNORMAL HIGH (ref 26.0–34.0)
MCH: 37.1 pg — ABNORMAL HIGH (ref 26.0–34.0)
MCHC: 35.7 g/dL (ref 30.0–36.0)
MCHC: 36.1 g/dL — ABNORMAL HIGH (ref 30.0–36.0)
MCV: 103.7 fL — ABNORMAL HIGH (ref 80.0–100.0)
MCV: 102.8 fL — ABNORMAL HIGH (ref 80.0–100.0)
Monocytes Absolute: 0.9 10*3/uL (ref 0.1–1.0)
Monocytes Absolute: 1 10*3/uL (ref 0.1–1.0)
Monocytes Relative: 12 %
Monocytes Relative: 12 %
Neutro Abs: 5.4 10*3/uL (ref 1.7–7.7)
Neutro Abs: 6.6 10*3/uL (ref 1.7–7.7)
Neutrophils Relative %: 74 %
Neutrophils Relative %: 77 %
Platelets: 192 10*3/uL (ref 150–400)
Platelets: 174 10*3/uL (ref 150–400)
RBC: 2.43 MIL/uL — ABNORMAL LOW (ref 4.22–5.81)
RBC: 2.13 MIL/uL — ABNORMAL LOW (ref 4.22–5.81)
RDW: 18.6 % — ABNORMAL HIGH (ref 11.5–15.5)
RDW: 18.7 % — ABNORMAL HIGH (ref 11.5–15.5)
WBC: 7.3 10*3/uL (ref 4.0–10.5)
WBC: 8.5 10*3/uL (ref 4.0–10.5)
nRBC: 5.9 % — ABNORMAL HIGH (ref 0.0–0.2)
nRBC: 5.3 % — ABNORMAL HIGH (ref 0.0–0.2)

## 2021-11-22 LAB — COMPREHENSIVE METABOLIC PANEL
ALT: 29 U/L (ref 0–44)
AST: 42 U/L — ABNORMAL HIGH (ref 15–41)
Albumin: 3.4 g/dL — ABNORMAL LOW (ref 3.5–5.0)
Alkaline Phosphatase: 194 U/L — ABNORMAL HIGH (ref 38–126)
Anion gap: 19 — ABNORMAL HIGH (ref 5–15)
BUN: 71 mg/dL — ABNORMAL HIGH (ref 8–23)
CO2: 21 mmol/L — ABNORMAL LOW (ref 22–32)
Calcium: 8.8 mg/dL — ABNORMAL LOW (ref 8.9–10.3)
Chloride: 92 mmol/L — ABNORMAL LOW (ref 98–111)
Creatinine, Ser: 7.19 mg/dL — ABNORMAL HIGH (ref 0.61–1.24)
GFR, Estimated: 8 mL/min — ABNORMAL LOW (ref >60)
Glucose, Bld: 106 mg/dL — ABNORMAL HIGH (ref 70–99)
Potassium: 6 mmol/L — ABNORMAL HIGH (ref 3.5–5.1)
Sodium: 132 mmol/L — ABNORMAL LOW (ref 135–145)
Total Bilirubin: 1.4 mg/dL — ABNORMAL HIGH (ref 0.3–1.2)
Total Protein: 8.4 g/dL — ABNORMAL HIGH (ref 6.5–8.1)

## 2021-11-22 LAB — HEPATITIS C ANTIBODY: HCV Ab: NONREACTIVE

## 2021-11-22 LAB — GLUCOSE, CAPILLARY: Glucose-Capillary: 109 mg/dL — ABNORMAL HIGH (ref 70–99)

## 2021-11-22 LAB — HEPATITIS B SURFACE ANTIGEN: Hepatitis B Surface Ag: NONREACTIVE

## 2021-11-22 LAB — RETICULOCYTES
Immature Retic Fract: 36.4 % — ABNORMAL HIGH (ref 2.3–15.9)
RBC.: 2.42 MIL/uL — ABNORMAL LOW (ref 4.22–5.81)
Retic Count, Absolute: 101.2 10*3/uL (ref 19.0–186.0)
Retic Ct Pct: 4.2 % — ABNORMAL HIGH (ref 0.4–3.1)

## 2021-11-22 LAB — MAGNESIUM: Magnesium: 2.1 mg/dL (ref 1.7–2.4)

## 2021-11-22 LAB — HEPATITIS B SURFACE ANTIBODY,QUALITATIVE: Hep B S Ab: REACTIVE — AB

## 2021-11-22 LAB — POTASSIUM
Potassium: 5.9 mmol/L — ABNORMAL HIGH (ref 3.5–5.1)
Potassium: 3.9 mmol/L (ref 3.5–5.1)

## 2021-11-22 LAB — HEPATITIS B CORE ANTIBODY, TOTAL: Hep B Core Total Ab: NONREACTIVE

## 2021-11-22 MED ORDER — ALTEPLASE 2 MG IJ SOLR: 2.0000 mg | Freq: Once | INTRAMUSCULAR | Status: DC | PRN

## 2021-11-22 MED ORDER — DOXERCALCIFEROL 4 MCG/2ML IV SOLN: 4.0000 ug | INTRAVENOUS | Status: DC

## 2021-11-22 MED ORDER — HYDROMORPHONE HCL 1 MG/ML IJ SOLN
1.0000 mg | Freq: Once | INTRAMUSCULAR | Status: AC
  Administered 2021-11-22: 1 mg via INTRAVENOUS
  Filled 2021-11-22: qty 1

## 2021-11-22 MED ORDER — ALLOPURINOL 100 MG PO TABS
100.0000 mg | ORAL_TABLET | Freq: Every day | ORAL | Status: DC
  Administered 2021-11-22 – 2021-11-24 (×3): 100 mg via ORAL
  Filled 2021-11-22 (×3): qty 1

## 2021-11-22 MED ORDER — POLYVINYL ALCOHOL 1.4 % OP SOLN
1.0000 [drp] | OPHTHALMIC | Status: DC | PRN
  Filled 2021-11-22: qty 15

## 2021-11-22 MED ORDER — HEPARIN SODIUM (PORCINE) 1000 UNIT/ML DIALYSIS: 1000.0000 [IU] | INTRAMUSCULAR | Status: DC | PRN

## 2021-11-22 MED ORDER — SENNOSIDES-DOCUSATE SODIUM 8.6-50 MG PO TABS
2.0000 | ORAL_TABLET | Freq: Every day | ORAL | Status: DC
  Administered 2021-11-22 – 2021-11-23 (×2): 2 via ORAL
  Filled 2021-11-22 (×2): qty 2

## 2021-11-22 MED ORDER — PANTOPRAZOLE SODIUM 40 MG PO TBEC: 40.0000 mg | DELAYED_RELEASE_TABLET | Freq: Two times a day (BID) | ORAL | Status: DC

## 2021-11-22 MED ORDER — NALOXONE HCL 0.4 MG/ML IJ SOLN
0.4000 mg | INTRAMUSCULAR | Status: DC | PRN
Start: 2021-11-22 — End: 2021-11-24

## 2021-11-22 MED ORDER — HYDROXYUREA 500 MG PO CAPS
500.0000 mg | ORAL_CAPSULE | Freq: Every day | ORAL | Status: DC
  Administered 2021-11-22 – 2021-11-24 (×2): 500 mg via ORAL
  Filled 2021-11-22 (×4): qty 1

## 2021-11-22 MED ORDER — SODIUM CHLORIDE 0.9% FLUSH
9.0000 mL | INTRAVENOUS | Status: DC | PRN
Start: 2021-11-22 — End: 2021-11-24

## 2021-11-22 MED ORDER — ACETAMINOPHEN 500 MG PO TABS: 500.0000 mg | ORAL_TABLET | Freq: Four times a day (QID) | ORAL | Status: DC | PRN

## 2021-11-22 MED ORDER — FLUTICASONE PROPIONATE 50 MCG/ACT NA SUSP
2.0000 | Freq: Every day | NASAL | Status: DC
  Administered 2021-11-22 – 2021-11-24 (×2): 2 via NASAL
  Filled 2021-11-22: qty 16

## 2021-11-22 MED ORDER — HYDROMORPHONE 1 MG/ML IV SOLN
INTRAVENOUS | Status: DC
  Administered 2021-11-22: 6 mg via INTRAVENOUS
  Administered 2021-11-23: 3 mg via INTRAVENOUS
  Administered 2021-11-23: 5 mg via INTRAVENOUS
  Administered 2021-11-23: 3.5 mg via INTRAVENOUS
  Administered 2021-11-24: 5 mg via INTRAVENOUS
  Administered 2021-11-24: 10 mg via INTRAVENOUS
  Administered 2021-11-24: 5 mg via INTRAVENOUS
  Filled 2021-11-22 (×5): qty 30

## 2021-11-22 MED ORDER — HYDROMORPHONE HCL 1 MG/ML IJ SOLN: 0.5000 mg | INTRAMUSCULAR | Status: DC | PRN

## 2021-11-22 MED ORDER — METOPROLOL TARTRATE 5 MG/5ML IV SOLN
5.0000 mg | Freq: Once | INTRAVENOUS | Status: AC
  Administered 2021-11-22: 5 mg via INTRAVENOUS
  Filled 2021-11-22: qty 5

## 2021-11-22 MED ORDER — LIDOCAINE-PRILOCAINE 2.5-2.5 % EX CREA
1.0000 "application " | TOPICAL_CREAM | CUTANEOUS | Status: DC | PRN
  Filled 2021-11-22: qty 5

## 2021-11-22 MED ORDER — CHLORHEXIDINE GLUCONATE CLOTH 2 % EX PADS
6.0000 | MEDICATED_PAD | Freq: Every day | CUTANEOUS | Status: DC
  Administered 2021-11-22 – 2021-11-24 (×2): 6 via TOPICAL

## 2021-11-22 MED ORDER — DEXTROSE 50 % IV SOLN
1.0000 | Freq: Once | INTRAVENOUS | Status: AC
Start: 2021-11-22 — End: 2021-11-22
  Administered 2021-11-22: 50 mL via INTRAVENOUS
  Filled 2021-11-22: qty 50

## 2021-11-22 MED ORDER — SODIUM ZIRCONIUM CYCLOSILICATE 10 G PO PACK
10.0000 g | PACK | Freq: Once | ORAL | Status: AC
  Administered 2021-11-22: 10 g via ORAL
  Filled 2021-11-22: qty 1

## 2021-11-22 MED ORDER — HYDROMORPHONE HCL 1 MG/ML IJ SOLN
INTRAMUSCULAR | Status: AC
  Filled 2021-11-22: qty 1

## 2021-11-22 MED ORDER — LACTATED RINGERS IV SOLN
INTRAVENOUS | Status: DC
Start: 2021-11-22 — End: 2021-11-22

## 2021-11-22 MED ORDER — ANTICOAGULANT SODIUM CITRATE 4% (200MG/5ML) IV SOLN
5.0000 mL | Status: DC | PRN
  Filled 2021-11-22: qty 5

## 2021-11-22 MED ORDER — HYDROMORPHONE HCL 1 MG/ML IJ SOLN
0.5000 mg | INTRAMUSCULAR | Status: DC | PRN
  Administered 2021-11-22: 0.5 mg via INTRAVENOUS
  Filled 2021-11-22: qty 1

## 2021-11-22 MED ORDER — DIPHENHYDRAMINE HCL 25 MG PO CAPS: 25.0000 mg | ORAL_CAPSULE | ORAL | Status: DC | PRN

## 2021-11-22 MED ORDER — CINACALCET HCL 30 MG PO TABS: 30.0000 mg | ORAL_TABLET | ORAL | Status: DC

## 2021-11-22 MED ORDER — OXYCODONE HCL 5 MG PO TABS
10.0000 mg | ORAL_TABLET | ORAL | Status: DC | PRN
  Administered 2021-11-22: 10 mg via ORAL
  Filled 2021-11-22: qty 2

## 2021-11-22 MED ORDER — CHLORHEXIDINE GLUCONATE CLOTH 2 % EX PADS: 6.0000 | MEDICATED_PAD | Freq: Every day | CUTANEOUS | Status: DC

## 2021-11-22 MED ORDER — FOLIC ACID 1 MG PO TABS
1.0000 mg | ORAL_TABLET | Freq: Every day | ORAL | Status: DC
  Administered 2021-11-22 – 2021-11-24 (×3): 1 mg via ORAL
  Filled 2021-11-22 (×3): qty 1

## 2021-11-22 MED ORDER — HYDROMORPHONE HCL 1 MG/ML IJ SOLN
1.0000 mg | Freq: Once | INTRAMUSCULAR | Status: AC
  Administered 2021-11-22: 1 mg via INTRAVENOUS

## 2021-11-22 MED ORDER — HYDROMORPHONE HCL 1 MG/ML IJ SOLN
0.5000 mg | INTRAMUSCULAR | Status: DC | PRN
Start: 2021-11-22 — End: 2021-11-22

## 2021-11-22 MED ORDER — LIDOCAINE HCL (PF) 1 % IJ SOLN: 5.0000 mL | INTRAMUSCULAR | Status: DC | PRN

## 2021-11-22 MED ORDER — HEPARIN SODIUM (PORCINE) 5000 UNIT/ML IJ SOLN
5000.0000 [IU] | Freq: Three times a day (TID) | INTRAMUSCULAR | Status: DC
  Administered 2021-11-22 – 2021-11-24 (×5): 5000 [IU] via SUBCUTANEOUS
  Filled 2021-11-22 (×8): qty 1

## 2021-11-22 MED ORDER — OXYCODONE HCL 5 MG PO TABS: 10.0000 mg | ORAL_TABLET | Freq: Four times a day (QID) | ORAL | Status: DC | PRN

## 2021-11-22 MED ORDER — ALBUTEROL SULFATE (2.5 MG/3ML) 0.083% IN NEBU
10.0000 mg | INHALATION_SOLUTION | Freq: Once | RESPIRATORY_TRACT | Status: AC
Start: 2021-11-22 — End: 2021-11-22
  Administered 2021-11-22: 10 mg via RESPIRATORY_TRACT
  Filled 2021-11-22: qty 12

## 2021-11-22 MED ORDER — SODIUM ZIRCONIUM CYCLOSILICATE 10 G PO PACK
10.0000 g | PACK | Freq: Once | ORAL | Status: AC
Start: 2021-11-22 — End: 2021-11-22
  Administered 2021-11-22: 10 g via ORAL
  Filled 2021-11-22: qty 1

## 2021-11-22 MED ORDER — NALOXONE HCL 0.4 MG/ML IJ SOLN
0.4000 mg | INTRAMUSCULAR | Status: DC | PRN
  Filled 2021-11-22: qty 1

## 2021-11-22 MED ORDER — INSULIN ASPART 100 UNIT/ML IV SOLN
5.0000 [IU] | Freq: Once | INTRAVENOUS | Status: AC
Start: 2021-11-22 — End: 2021-11-22
  Administered 2021-11-22: 5 [IU] via INTRAVENOUS

## 2021-11-22 MED ORDER — VITAMIN D (ERGOCALCIFEROL) 1.25 MG (50000 UNIT) PO CAPS: 50000.0000 [IU] | ORAL_CAPSULE | ORAL | Status: DC

## 2021-11-22 MED ORDER — OXYCODONE HCL 5 MG PO TABS
10.0000 mg | ORAL_TABLET | ORAL | Status: DC | PRN
Start: 2021-11-22 — End: 2021-11-22

## 2021-11-22 MED ORDER — PENTAFLUOROPROP-TETRAFLUOROETH EX AERO: 1.0000 "application " | INHALATION_SPRAY | CUTANEOUS | Status: DC | PRN

## 2021-11-22 NOTE — H&P (Addendum)
History and Physical  Patrick [name] TFT:[id number] DOB: November 25, 1955 DOA: 11/21/2021  Referring physician: Dr. Dolly Rias, [place]  PCP: [name], MD  Outpatient Specialists: Sickle cell team, GI, cardiology. Patient coming from: Home  Chief Complaint: Sickle cell pain crisis.  HPI: Patrick [name] is a 66 y.o. male with medical history significant for sickle cell anemia, chronic pain syndrome on chronic opioid use, ESRD on HD TTS, former smoker, permanent A-fib not anticoagulated, pulmonary hypertension, nonischemic cardiomyopathy who presented to [facility] ED from home with complaints of sickle cell pain crisis x 1 day.  He ran out of his home opioids for a few days.    The patient was seen initially in the ED earlier this morning for the same.  He was given home dose pain medications without relief, but had to leave for dialysis.  Unfortunately, he was unable to get a ride to his dialysis.  He returned to the ED with complaints of hurting all over.  Lab studies revealed serum potassium 6.0.  Hg 9.0.  The patient missed his hemodialysis session.  EDP discussed with nephrology who will arrange for hemodialysis in the hospital 11/22/21 prior to discharge.  The patient was admitted by [facility], hospitalist service, for sickle cell pain crisis.  ED Course: Tmax 98.7.  BP 119/88, pulse 84, respiratory 19, saturation 99% on room air.  Lab studies remarkable for WBC 7.3, hemoglobin 9.0, MCV 103, platelet count 193.  Simpla 132, potassium 6.0, bicarb 21, creatinine 7.19, BUN 71, alkaline 194, AST 42, T. bili 1.4.  Review of Systems: Review of systems as noted in the HPI. All other systems reviewed and are negative.   Past Medical History:  Diagnosis Date   Blood dyscrasia    Blood transfusion    "I've had a bunch of them"   CHF (congestive heart failure) ([place])    followed by hf clinic   Chronic kidney disease (CKD), stage III (moderate) ([place])    Dialysis patient ([place]) 04/2019   AV fistula  (right arm)   Gout    Headache(784.0)    "probably weekly" (01/13/2014)   Legally blind    Shortness of breath dyspnea    Sickle cell disease, type SS (HCC)    Swelling abdomen    Swelling of extremity, left    Swelling of extremity, right    Past Surgical History:  Procedure Laterality Date   A/V FISTULAGRAM Right 07/29/2020   Procedure: A/V FISTULAGRAM - Right Arm;  Surgeon: Angelia Mould, MD;  Location: [place] CV LAB;  Service: Cardiovascular;  Laterality: Right;   BASCILIC VEIN TRANSPOSITION Right 04/12/2014   Procedure: BASILIC VEIN TRANSPOSITION;  Surgeon: Elam Dutch, MD;  Location: [place];  Service: Vascular;  Laterality: Right;   BIOPSY  08/04/2020   Procedure: BIOPSY;  Surgeon: Ladene Artist, MD;  Location: [facility] ENDOSCOPY;  Service: Gastroenterology;;   BIOPSY  06/28/2021   Procedure: BIOPSY;  Surgeon: Jerene Bears, MD;  Location: [facility] ENDOSCOPY;  Service: Gastroenterology;;   BOWEL RESECTION N/A 05/24/2021   Procedure: SMALL BOWEL RESECTION;  Surgeon: Rolm Bookbinder, MD;  Location: [place];  Service: General;  Laterality: N/A;   CARDIAC CATHETERIZATION  05/2011   CARDIOVERSION N/A 06/05/2018   Procedure: CARDIOVERSION;  Surgeon: Jolaine Artist, MD;  Location: [place];  Service: Cardiovascular;  Laterality: N/A;   CARDIOVERSION N/A 10/06/2019   Procedure: CARDIOVERSION;  Surgeon: Jolaine Artist, MD;  Location: [place];  Service: Cardiovascular;  Laterality: N/A;  CHOLECYSTECTOMY N/A 05/24/2021   Procedure: OPEN CHOLECYSTECTOMY;  Surgeon: Rolm Bookbinder, MD;  Location: [place];  Service: General;  Laterality: N/A;   COLONOSCOPY WITH PROPOFOL N/A 09/24/2017   Procedure: COLONOSCOPY WITH PROPOFOL;  Surgeon: Jackquline Denmark, MD;  Location: [facility] ENDOSCOPY;  Service: Endoscopy;  Laterality: N/A;   ESOPHAGOGASTRODUODENOSCOPY N/A 09/19/2017   Procedure: ESOPHAGOGASTRODUODENOSCOPY (EGD);  Surgeon: Jackquline Denmark, MD;  Location: [facility] ENDOSCOPY;  Service:  Endoscopy;  Laterality: N/A;   ESOPHAGOGASTRODUODENOSCOPY N/A 05/29/2021   Procedure: ESOPHAGOGASTRODUODENOSCOPY (EGD);  Surgeon: Milus Banister, MD;  Location: [facility] ENDOSCOPY;  Service: Gastroenterology;  Laterality: N/A;   ESOPHAGOGASTRODUODENOSCOPY (EGD) WITH PROPOFOL N/A 08/04/2020   Procedure: ESOPHAGOGASTRODUODENOSCOPY (EGD) WITH PROPOFOL;  Surgeon: Ladene Artist, MD;  Location: [place];  Service: Gastroenterology;  Laterality: N/A;   ESOPHAGOGASTRODUODENOSCOPY (EGD) WITH PROPOFOL N/A 06/28/2021   Procedure: ESOPHAGOGASTRODUODENOSCOPY (EGD) WITH PROPOFOL;  Surgeon: Jerene Bears, MD;  Location: [facility] ENDOSCOPY;  Service: Gastroenterology;  Laterality: N/A;   ESOPHAGOGASTRODUODENOSCOPY (EGD) WITH PROPOFOL N/A 08/10/2021   Procedure: ESOPHAGOGASTRODUODENOSCOPY (EGD) WITH PROPOFOL;  Surgeon: Jackquline Denmark, MD;  Location: [place];  Service: Gastroenterology;  Laterality: N/A;   ESOPHAGOGASTRODUODENOSCOPY (EGD) WITH PROPOFOL N/A 08/02/2021   Procedure: ESOPHAGOGASTRODUODENOSCOPY (EGD) WITH PROPOFOL;  Surgeon: Lavena Bullion, DO;  Location: [place];  Service: Gastroenterology;  Laterality: N/A;   HEMOSTASIS CLIP PLACEMENT  05/29/2021   Procedure: HEMOSTASIS CLIP PLACEMENT;  Surgeon: Milus Banister, MD;  Location: [facility] ENDOSCOPY;  Service: Gastroenterology;;   HOT HEMOSTASIS N/A 05/29/2021   Procedure: HOT HEMOSTASIS (ARGON PLASMA COAGULATION/BICAP);  Surgeon: Milus Banister, MD;  Location: [facility] ENDOSCOPY;  Service: Gastroenterology;  Laterality: N/A;   HOT HEMOSTASIS N/A 06/28/2021   Procedure: HOT HEMOSTASIS (ARGON PLASMA COAGULATION/BICAP);  Surgeon: Jerene Bears, MD;  Location: [facility] ENDOSCOPY;  Service: Gastroenterology;  Laterality: N/A;   HOT HEMOSTASIS N/A 08/10/2021   Procedure: HOT HEMOSTASIS (ARGON PLASMA COAGULATION/BICAP);  Surgeon: Jackquline Denmark, MD;  Location: [facility] ENDOSCOPY;  Service: Gastroenterology;  Laterality: N/A;   HOT HEMOSTASIS N/A 08/02/2021   Procedure: HOT  HEMOSTASIS (ARGON PLASMA COAGULATION/BICAP);  Surgeon: Lavena Bullion, DO;  Location: [facility] ENDOSCOPY;  Service: Gastroenterology;  Laterality: N/A;   IR PERC TUN PERIT CATH WO PORT S&I /IMAG  05/23/2021   IR REMOVAL TUN CV CATH W/O FL  06/10/2021   IR US GUIDE VASC ACCESS RIGHT  05/23/2021   LAPAROSCOPY N/A 05/24/2021   Procedure: LAPAROSCOPY DIAGNOSTIC;  Surgeon: Rolm Bookbinder, MD;  Location: [place];  Service: General;  Laterality: N/A;   LAPAROTOMY N/A 05/24/2021   Procedure: EXPLORATORY LAPAROTOMY;  Surgeon: Rolm Bookbinder, MD;  Location: [place];  Service: General;  Laterality: N/A;   LEFT AND RIGHT HEART CATHETERIZATION WITH CORONARY ANGIOGRAM N/A 06/11/2011   Procedure: LEFT AND RIGHT HEART CATHETERIZATION WITH CORONARY ANGIOGRAM;  Surgeon: Larey Dresser, MD;  Location: [facility] CATH LAB;  Service: Cardiovascular;  Laterality: N/A;   PERIPHERAL VASCULAR BALLOON ANGIOPLASTY Right 07/29/2020   Procedure: PERIPHERAL VASCULAR BALLOON ANGIOPLASTY;  Surgeon: Angelia Mould, MD;  Location: [place] CV LAB;  Service: Cardiovascular;  Laterality: Right;  arm fistula   REVISON OF ARTERIOVENOUS FISTULA Right 08/08/2020   Procedure: ANEURYSM EXCISION OF RIGHT UPPER EXTREMITY ARTERIOVENOUS FISTULA;  Surgeon: Angelia Mould, MD;  Location: [place];  Service: Vascular;  Laterality: Right;   SCLEROTHERAPY  05/29/2021   Procedure: Clide Deutscher;  Surgeon: Milus Banister, MD;  Location: [facility] ENDOSCOPY;  Service: Gastroenterology;;   Lavell Islam REMOVAL  05/29/2021   Procedure: STENT REMOVAL;  Surgeon: Milus Banister, MD;  Location: [facility] ENDOSCOPY;  Service: Gastroenterology;;   TEE WITHOUT CARDIOVERSION N/A 12/27/2015   Procedure: TRANSESOPHAGEAL ECHOCARDIOGRAM (TEE);  Surgeon: Sueanne Margarita, MD;  Location: [facility] ENDOSCOPY;  Service: Cardiovascular;  Laterality: N/A;    Social History:  reports that he quit smoking about 5 years ago. His smoking use included cigarettes. He has a 20.50 pack-year  smoking history. He has never used smokeless tobacco. He reports that he does not currently use alcohol after a past usage of about 3.0 standard drinks per week. He reports that he does not use drugs.   No Known Allergies  Family History  Problem Relation Age of Onset   Alcohol abuse Mother    Liver disease Mother    Cirrhosis Mother    Heart attack Mother       Prior to Admission medications   Medication Sig Start Date End Date Taking? Authorizing Provider  acetaminophen (TYLENOL) 500 MG tablet Take 500 mg by mouth every 6 (six) hours as needed for moderate pain.    [provider]  allopurinol (ZYLOPRIM) 100 MG tablet Take 1 tablet (100 mg total) by mouth daily. 11/08/21   Dorena Dew, FNP  AURYXIA 1 GM 210 MG(Fe) tablet Take 210 mg by mouth 3 (three) times daily. 02/11/21   [provider]  cinacalcet (SENSIPAR) 30 MG tablet Take 1 tablet (30 mg total) by mouth Every Tuesday,Thursday,and Saturday with dialysis. 08/31/21 11/29/21  Dahal, Marlowe Aschoff, MD  fluticasone (FLONASE) 50 MCG/ACT nasal spray Place 2 sprays into both nostrils daily. 09/04/21   Passmore, Jake Church I, NP  folic acid (FOLVITE) 1 MG tablet TAKE 1 TABLET BY MOUTH EVERY DAY Patient taking differently: Take 1 mg by mouth daily. 02/22/21   Dorena Dew, FNP  Glycerin-Hypromellose-PEG 400 (VISINE DRY EYE OP) Place 1 drop into both eyes as needed (dryness).    [provider]  hydroxyurea (HYDREA) 500 MG capsule Take 1 capsule (500 mg total) by mouth daily. 11/08/21   Dorena Dew, FNP  lidocaine-prilocaine (EMLA) cream Apply 1 application. topically Every Tuesday,Thursday,and Saturday with dialysis. 09/08/19   [provider]  multivitamin (RENA-VIT) TABS tablet Take 1 tablet by mouth at bedtime. Patient not taking: Reported on 09/28/2021 08/30/21 11/28/21  Terrilee Croak, MD  Nutritional Supplements (,FEEDING SUPPLEMENT, PROSOURCE PLUS) liquid Take 30 mLs by mouth 2 (two) times daily between  meals. 08/30/21   Terrilee Croak, MD  Oxycodone HCl 10 MG TABS Take 1 tablet (10 mg total) by mouth every 6 (six) hours as needed. 10/20/21   Dorena Dew, FNP  pantoprazole (PROTONIX) 40 MG tablet TAKE 1 TABLET BY MOUTH TWICE A DAY 11/16/21   Willia Craze, NP  tiZANidine (ZANAFLEX) 4 MG tablet Take 0.5 tablets (2 mg total) by mouth every 8 (eight) hours as needed for muscle spasms. 07/18/21   Dorena Dew, FNP  torsemide (DEMADEX) 20 MG tablet Take 80 mg by mouth daily. 09/03/21   [provider]  Vitamin D, Ergocalciferol, (DRISDOL) 1.25 MG (50000 UNIT) CAPS capsule Take 1 capsule (50,000 Units total) by mouth once a week. Monday 11/08/21   Dorena Dew, FNP    Physical Exam: BP 127/79   Pulse 87   Resp 17   Ht 5\' 11"  (1.803 m)   Wt 60.8 kg   SpO2 96%   BMI 18.69 kg/m   General: 67 y.o. year-old male well developed well nourished in no acute distress.  Alert and oriented  x3. Cardiovascular: Irregular rate and rhythm with no rubs or gallops.  No thyromegaly or JVD noted.  Trace lower extremity edema.  Respiratory: Clear to auscultation with no wheezes or rales. Poor inspiratory effort. Abdomen: Distended with normal bowel sounds x4 quadrants. Muskuloskeletal: No cyanosis or clubbing.  Trace edema noted bilaterally Neuro: CN II-XII intact, strength, sensation, reflexes Skin: Abdominal wall scar, healing, POA Psychiatry: Judgement and insight appear normal. Mood is appropriate for condition and setting          Labs on Admission:  Basic Metabolic Panel: Recent Labs  Lab 11/21/21 0850 11/21/21 2347  NA 130* 132*  K 5.3* 6.0*  CL 96* 92*  CO2  --  21*  GLUCOSE 104* 106*  BUN 59* 71*  CREATININE 7.50* 7.19*  CALCIUM  --  8.8*   Liver Function Tests: Recent Labs  Lab 11/21/21 2347  AST 42*  ALT 29  ALKPHOS 194*  BILITOT 1.4*  PROT 8.4*  ALBUMIN 3.4*   No results for input(s): LIPASE, AMYLASE in the last 168 hours. No results for input(s): AMMONIA  in the last 168 hours. CBC: Recent Labs  Lab 11/21/21 0649 11/21/21 0850 11/21/21 2347  WBC 6.5  --  7.3  NEUTROABS 4.2  --  5.4  HGB 8.3* 10.2* 9.0*  HCT 22.4* 30.0* 25.2*  MCV 102.3*  --  103.7*  PLT 192  --  192   Cardiac Enzymes: No results for input(s): CKTOTAL, CKMB, CKMBINDEX, TROPONINI in the last 168 hours.  BNP (last 3 results) No results for input(s): BNP in the last 8760 hours.  ProBNP (last 3 results) No results for input(s): PROBNP in the last 8760 hours.  CBG: No results for input(s): GLUCAP in the last 168 hours.  Radiological Exams on Admission: DG Chest Portable 1 View  Result Date: 11/22/2021 CLINICAL DATA:  Fluid overload. EXAM: PORTABLE CHEST 1 VIEW COMPARISON:  November 21, 2021 (6:48 a.m.) FINDINGS: The cardiac silhouette is moderately enlarged and unchanged in size. There is marked severity calcification of the aortic arch. Diffuse, chronic appearing increased interstitial lung markings are seen. There is no evidence of focal consolidation, pleural effusion or pneumothorax. The visualized skeletal structures are unremarkable. IMPRESSION: 1. Stable cardiomegaly without evidence of acute or active cardiopulmonary disease. Electronically Signed   By: Virgina Norfolk M.D.   On: 11/22/2021 00:10   DG Chest Port 1 View  Result Date: 11/21/2021 CLINICAL DATA:  Sickle cell crisis.  Chest pain. EXAM: PORTABLE CHEST 1 VIEW COMPARISON:  09/13/2021 FINDINGS: Stable cardiac enlargement. Aortic atherosclerosis. Chronic interstitial coarsening is identified bilaterally. No superimposed pleural effusion, interstitial edema or airspace consolidation. The visualized osseous structures are unremarkable. IMPRESSION: 1. No acute cardiopulmonary abnormalities. 2. Chronic interstitial coarsening. Electronically Signed   By: Kerby Moors M.D.   On: 11/21/2021 07:15    EKG: I independently viewed the EKG done and my findings are as followed: Atrial fibrillation rate of 85.  Nonspecific  ST-T changes.  QTc 506.  Assessment/Plan Present on Admission:  Sickle cell pain crisis ([place])  Principal Problem:   Sickle cell pain crisis ([place])  Sickle cell pain crisis, unclear trigger. IV fluid held due to volume overload after missing hemodialysis IV opioids, O2 supplementation Resume home regimen, hydroxyurea  ESRD on HD TTS Missed hemodialysis on 11/21/2021 Last HD session was on Saturday 11/18/21, completed all 4 hours. Plan for hemodialysis, per nephrology Volume status and electrolytes managed through hemodialysis  Hyperkalemia in the setting of missed hemodialysis Presented with serum  potassium of 6.0 No diffuse peaked T waves on twelve-lead EKG Received Lokelma Repeat renal function panel in the AM  Hypervolemic hyponatremia in the setting of missed hemodialysis Serum Na+ 132 Holding off IV fluid hydration despite acute sickle cell pain crisis due to hypervolemia and missed hemodialysis session to avoid worsening volume status. Like HD on 11/22/21  Permanent A-fib Currently rate controlled Monitor on telemetry Not on oral anticoagulation prior to admission  Prolonged QTC Avoid QTC prolonging agents Optimize potassium and magnesium levels Repeat 12 lead EKG in the AM.  Anemia of chronic disease in ESRD Hg 9.0 with no overt bleeding Transfuse to maintain Hg>8.0 Continue to monitor H&H  GERD Hold off home PPI for now due to prolonged QTC  Chronic pain syndrome in the setting of sickle pain crisis Analgesics as needed with bowel regimen     DVT prophylaxis: Subcu heparin 3 times daily  Code Status: Full code  Family Communication: None at bedside  Disposition Plan: Admitted to telemetry medical unit  Consults called: Nephrology consulted by EDP  Admission status: Observation status   Status is: Observation    Kayleen Memos MD [facility] Pager [phone]  If 7PM-7AM, please contact night-coverage [email or web address] Password  [email or web address]  11/22/2021, 1:19 AM

## 2021-11-22 NOTE — Progress Notes (Signed)
Potassium is 6.6 this morning. Plan to check EKG, treat now with albuterol, insulin/dextrose, and Lokelma, and also give calcium and bicarbonate if there are EKG changes.

## 2021-11-22 NOTE — Progress Notes (Signed)
NEW ADMISSION NOTE New Admission Note:   Arrival Method: Stretcher from ED Mental Orientation: Alert and Oriented x4 Telemetry: Tele Box 6 Assessment:  To Be Completed Skin: Abrasions on sacrum and open wound on abdomen IV: Left forearm saline locked Pain: 10/10. Provided pt with pain medication. Tubes: Right upper arm fistula Safety Measures: Safety Fall Prevention Plan has been given, discussed and signed Admission: Initiated [facility] [place] Orientation: Patient has been orientated to the room, unit and staff.  Family: Not present at the bedside  Orders have been reviewed and implemented. Will continue to monitor the patient. Call light has been placed within reach and bed alarm has been activated.   Sharmon Revere, RN

## 2021-11-22 NOTE — Progress Notes (Signed)
Patient admitted with sickle cell pain crisis. Pain remains uncontrolled despite 0.5 mg IV Dilaudid q2h. Plan to start weight-based Dilaudid PCA.

## 2021-11-22 NOTE — Procedures (Signed)
Patient seen on Hemodialysis. BP 116/83   Pulse (!) 108   Temp 97.9 F (36.6 C)   Resp 17   Ht 5\' 11"  (1.803 m)   Wt 64 kg   SpO2 98%   BMI 19.68 kg/m   QB 300, UF goal 4L Tolerating treatment with complaints of pain from sickle cell crisis at this time.   Elmarie Shiley MD [facility] Office # [phone] Pager # [phone] 10:00 AM

## 2021-11-22 NOTE — Progress Notes (Signed)
Notified by lab about critical value of potassium 6.6. Dr. Myna Hidalgo made aware via secure page. Orders given and implemented.

## 2021-11-22 NOTE — Progress Notes (Signed)
Patient seen and examined.  Admitted early morning hours by nighttime hospitalist.  Came back from hemodialysis.  Complains of pain all over.  66 year old man with history of sickle cell anemia, chronic pain syndrome on opiates, ESRD on hemodialysis TTS schedule who missed hemodialysis yesterday because of pain, ran out of opiates for last 3 days admitted with painful crisis and missed hemodialysis with hyperkalemia.  Plan: Received hemodialysis.  Monitor today. Patient had inadequate pain control with oral opiates, currently remains on Dilaudid PCA 0.5 mg every 10 minutes with maximum 3 mg in 1 hour with close monitoring.  No evidence of superadded infections.  Hopefully we can control his pain and convert him to oral opiates by tomorrow morning.   Same-day admit.  No charge visit.

## 2021-11-22 NOTE — Consult Note (Addendum)
[facility] Renal Consultation Note  Indication for Consultation:  Management of ESRD/hemodialysis; anemia, hypertension/volume and secondary hyperparathyroidism  HPI: Patrick [name] is a 66 y.o. male with history of ESRD on HD TTS [facility] sickle cell anemia chronic pain syndrome chronic opioid use A-fib, no anticoagulation, pulmonary hypertension, nonischemic cardiomyopathy admitted now with sickle cell pain crisis (patient ran out of his home opioids for few days) Recently has been compliant with HD missed yesterday because of sickle cell crisis  Labs in the ER K was 6.0 Hgb 9 .0, NA 132 BUN 71 CR 7.19 bicarb 20.  Afebrile BP 119/88 O2 sats 99 % on room air CXR= no active cardiopulmonary disease  A.m. K6.6 NA 130  He denies any fever chills, cough abdominal pain, nausea vomiting diarrhea.  Consulted for HD issues/ESRD with his hyperkalemia.      Past Medical History:  Diagnosis Date   Blood dyscrasia    Blood transfusion    "I've had a bunch of them"   CHF (congestive heart failure) ([place])    followed by hf clinic   Chronic kidney disease (CKD), stage III (moderate) ([place])    Dialysis patient ([place]) 04/2019   AV fistula (right arm)   Gout    Headache(784.0)    "probably weekly" (01/13/2014)   Legally blind    Shortness of breath dyspnea    Sickle cell disease, type SS (HCC)    Swelling abdomen    Swelling of extremity, left    Swelling of extremity, right     Past Surgical History:  Procedure Laterality Date   A/V FISTULAGRAM Right 07/29/2020   Procedure: A/V FISTULAGRAM - Right Arm;  Surgeon: Angelia Mould, MD;  Location: [place] [facility];  Service: Cardiovascular;  Laterality: Right;   BASCILIC VEIN TRANSPOSITION Right 04/12/2014   Procedure: BASILIC VEIN TRANSPOSITION;  Surgeon: Elam Dutch, MD;  Location: [place];  Service: Vascular;  Laterality: Right;   BIOPSY  08/04/2020   Procedure: BIOPSY;  Surgeon: Ladene Artist, MD;   Location: [facility] ENDOSCOPY;  Service: Gastroenterology;;   BIOPSY  06/28/2021   Procedure: BIOPSY;  Surgeon: Jerene Bears, MD;  Location: [facility] ENDOSCOPY;  Service: Gastroenterology;;   BOWEL RESECTION N/A 05/24/2021   Procedure: SMALL BOWEL RESECTION;  Surgeon: Rolm Bookbinder, MD;  Location: [place];  Service: General;  Laterality: N/A;   CARDIAC CATHETERIZATION  05/2011   CARDIOVERSION N/A 06/05/2018   Procedure: CARDIOVERSION;  Surgeon: Jolaine Artist, MD;  Location: [place];  Service: Cardiovascular;  Laterality: N/A;   CARDIOVERSION N/A 10/06/2019   Procedure: CARDIOVERSION;  Surgeon: Jolaine Artist, MD;  Location: [facility] ENDOSCOPY;  Service: Cardiovascular;  Laterality: N/A;   CHOLECYSTECTOMY N/A 05/24/2021   Procedure: OPEN CHOLECYSTECTOMY;  Surgeon: Rolm Bookbinder, MD;  Location: [place];  Service: General;  Laterality: N/A;   COLONOSCOPY WITH PROPOFOL N/A 09/24/2017   Procedure: COLONOSCOPY WITH PROPOFOL;  Surgeon: Jackquline Denmark, MD;  Location: [place];  Service: Endoscopy;  Laterality: N/A;   ESOPHAGOGASTRODUODENOSCOPY N/A 09/19/2017   Procedure: ESOPHAGOGASTRODUODENOSCOPY (EGD);  Surgeon: Jackquline Denmark, MD;  Location: [facility];  Service: Endoscopy;  Laterality: N/A;   ESOPHAGOGASTRODUODENOSCOPY N/A 05/29/2021   Procedure: ESOPHAGOGASTRODUODENOSCOPY (EGD);  Surgeon: Milus Banister, MD;  Location: [facility] ENDOSCOPY;  Service: Gastroenterology;  Laterality: N/A;   ESOPHAGOGASTRODUODENOSCOPY (EGD) WITH PROPOFOL N/A 08/04/2020   Procedure: ESOPHAGOGASTRODUODENOSCOPY (EGD) WITH PROPOFOL;  Surgeon: Ladene Artist, MD;  Location: [facility];  Service: Gastroenterology;  Laterality: N/A;   ESOPHAGOGASTRODUODENOSCOPY (  EGD) WITH PROPOFOL N/A 06/28/2021   Procedure: ESOPHAGOGASTRODUODENOSCOPY (EGD) WITH PROPOFOL;  Surgeon: Jerene Bears, MD;  Location: [place];  Service: Gastroenterology;  Laterality: N/A;   ESOPHAGOGASTRODUODENOSCOPY (EGD) WITH PROPOFOL N/A 08/10/2021   Procedure:  ESOPHAGOGASTRODUODENOSCOPY (EGD) WITH PROPOFOL;  Surgeon: Jackquline Denmark, MD;  Location: [place];  Service: Gastroenterology;  Laterality: N/A;   ESOPHAGOGASTRODUODENOSCOPY (EGD) WITH PROPOFOL N/A 08/02/2021   Procedure: ESOPHAGOGASTRODUODENOSCOPY (EGD) WITH PROPOFOL;  Surgeon: Lavena Bullion, DO;  Location: [place];  Service: Gastroenterology;  Laterality: N/A;   HEMOSTASIS CLIP PLACEMENT  05/29/2021   Procedure: HEMOSTASIS CLIP PLACEMENT;  Surgeon: Milus Banister, MD;  Location: [facility] ENDOSCOPY;  Service: Gastroenterology;;   HOT HEMOSTASIS N/A 05/29/2021   Procedure: HOT HEMOSTASIS (ARGON PLASMA COAGULATION/BICAP);  Surgeon: Milus Banister, MD;  Location: [facility] ENDOSCOPY;  Service: Gastroenterology;  Laterality: N/A;   HOT HEMOSTASIS N/A 06/28/2021   Procedure: HOT HEMOSTASIS (ARGON PLASMA COAGULATION/BICAP);  Surgeon: Jerene Bears, MD;  Location: [facility] ENDOSCOPY;  Service: Gastroenterology;  Laterality: N/A;   HOT HEMOSTASIS N/A 08/10/2021   Procedure: HOT HEMOSTASIS (ARGON PLASMA COAGULATION/BICAP);  Surgeon: Jackquline Denmark, MD;  Location: [facility] ENDOSCOPY;  Service: Gastroenterology;  Laterality: N/A;   HOT HEMOSTASIS N/A 08/02/2021   Procedure: HOT HEMOSTASIS (ARGON PLASMA COAGULATION/BICAP);  Surgeon: Lavena Bullion, DO;  Location: [facility] ENDOSCOPY;  Service: Gastroenterology;  Laterality: N/A;   IR PERC TUN PERIT CATH WO PORT S&I /IMAG  05/23/2021   IR REMOVAL TUN CV CATH W/O FL  06/10/2021   IR US GUIDE VASC ACCESS RIGHT  05/23/2021   LAPAROSCOPY N/A 05/24/2021   Procedure: LAPAROSCOPY DIAGNOSTIC;  Surgeon: Rolm Bookbinder, MD;  Location: [place];  Service: General;  Laterality: N/A;   LAPAROTOMY N/A 05/24/2021   Procedure: EXPLORATORY LAPAROTOMY;  Surgeon: Rolm Bookbinder, MD;  Location: [place];  Service: General;  Laterality: N/A;   LEFT AND RIGHT HEART CATHETERIZATION WITH CORONARY ANGIOGRAM N/A 06/11/2011   Procedure: LEFT AND RIGHT HEART CATHETERIZATION WITH CORONARY ANGIOGRAM;   Surgeon: Larey Dresser, MD;  Location: [facility] C[facility] LAB;  Service: Cardiovascular;  Laterality: N/A;   PERIPHERAL VASCULAR BALLOON ANGIOPLASTY Right 07/29/2020   Procedure: PERIPHERAL VASCULAR BALLOON ANGIOPLASTY;  Surgeon: Angelia Mould, MD;  Location: [place] [facility];  Service: Cardiovascular;  Laterality: Right;  arm fistula   REVISON OF ARTERIOVENOUS FISTULA Right 08/08/2020   Procedure: ANEURYSM EXCISION OF RIGHT UPPER EXTREMITY ARTERIOVENOUS FISTULA;  Surgeon: Angelia Mould, MD;  Location: [facility] OR;  Service: Vascular;  Laterality: Right;   SCLEROTHERAPY  05/29/2021   Procedure: Clide Deutscher;  Surgeon: Milus Banister, MD;  Location: [facility] ENDOSCOPY;  Service: Gastroenterology;;   Lavell Islam REMOVAL  05/29/2021   Procedure: STENT REMOVAL;  Surgeon: Milus Banister, MD;  Location: [facility] ENDOSCOPY;  Service: Gastroenterology;;   TEE WITHOUT CARDIOVERSION N/A 12/27/2015   Procedure: TRANSESOPHAGEAL ECHOCARDIOGRAM (TEE);  Surgeon: Sueanne Margarita, MD;  Location: [facility] ENDOSCOPY;  Service: Cardiovascular;  Laterality: N/A;      Family History  Problem Relation Age of Onset   Alcohol abuse Mother    Liver disease Mother    Cirrhosis Mother    Heart attack Mother       reports that he quit smoking about 5 years ago. His smoking use included cigarettes. He has a 20.50 pack-year smoking history. He has never used smokeless tobacco. He reports that he does not currently use alcohol after a past usage of about 3.0 standard drinks per week. He reports that he does not  use drugs.  No Known Allergies  Prior to Admission medications   Medication Sig Start Date End Date Taking? Authorizing Provider  acetaminophen (TYLENOL) 500 MG tablet Take 500 mg by mouth every 6 (six) hours as needed for moderate pain.    [provider]  allopurinol (ZYLOPRIM) 100 MG tablet Take 1 tablet (100 mg total) by mouth daily. 11/08/21   Dorena Dew, FNP  AURYXIA 1 GM 210 MG(Fe) tablet Take 210 mg by  mouth 3 (three) times daily. 02/11/21   [provider]  cinacalcet (SENSIPAR) 30 MG tablet Take 1 tablet (30 mg total) by mouth Every Tuesday,Thursday,and Saturday with dialysis. 08/31/21 11/29/21  Dahal, Marlowe Aschoff, MD  fluticasone (FLONASE) 50 MCG/ACT nasal spray Place 2 sprays into both nostrils daily. 09/04/21   Passmore, Jake Church I, NP  folic acid (FOLVITE) 1 MG tablet TAKE 1 TABLET BY MOUTH EVERY DAY Patient taking differently: Take 1 mg by mouth daily. 02/22/21   Dorena Dew, FNP  Glycerin-Hypromellose-PEG 400 (VISINE DRY EYE OP) Place 1 drop into both eyes as needed (dryness).    [provider]  hydroxyurea (HYDREA) 500 MG capsule Take 1 capsule (500 mg total) by mouth daily. 11/08/21   Dorena Dew, FNP  lidocaine-prilocaine (EMLA) cream Apply 1 application. topically Every Tuesday,Thursday,and Saturday with dialysis. 09/08/19   [provider]  multivitamin (RENA-VIT) TABS tablet Take 1 tablet by mouth at bedtime. Patient not taking: Reported on 09/28/2021 08/30/21 11/28/21  Terrilee Croak, MD  Nutritional Supplements (,FEEDING SUPPLEMENT, PROSOURCE PLUS) liquid Take 30 mLs by mouth 2 (two) times daily between meals. 08/30/21   Terrilee Croak, MD  Oxycodone HCl 10 MG TABS Take 1 tablet (10 mg total) by mouth every 6 (six) hours as needed. 10/20/21   Dorena Dew, FNP  pantoprazole (PROTONIX) 40 MG tablet TAKE 1 TABLET BY MOUTH TWICE A DAY 11/16/21   Willia Craze, NP  tiZANidine (ZANAFLEX) 4 MG tablet Take 0.5 tablets (2 mg total) by mouth every 8 (eight) hours as needed for muscle spasms. 07/18/21   Dorena Dew, FNP  torsemide (DEMADEX) 20 MG tablet Take 80 mg by mouth daily. 09/03/21   [provider]  Vitamin D, Ergocalciferol, (DRISDOL) 1.25 MG (50000 UNIT) CAPS capsule Take 1 capsule (50,000 Units total) by mouth once a week. Monday 11/08/21   Dorena Dew, FNP      Results for orders placed or performed during the hospital encounter of  11/21/21 (from the past 48 hour(s))  CBC with Differential     Status: Abnormal   Collection Time: 11/21/21 11:47 PM  Result Value Ref Range   WBC 7.3 4.0 - 10.5 K/uL   RBC 2.43 (L) 4.22 - 5.81 MIL/uL   Hemoglobin 9.0 (L) 13.0 - 17.0 g/dL   HCT 25.2 (L) 39.0 - 52.0 %   MCV 103.7 (H) 80.0 - 100.0 fL   MCH 37.0 (H) 26.0 - 34.0 pg   MCHC 35.7 30.0 - 36.0 g/dL   RDW 18.6 (H) 11.5 - 15.5 %   Platelets 192 150 - 400 K/uL   nRBC 5.9 (H) 0.0 - 0.2 %   Neutrophils Relative % 74 %   Neutro Abs 5.4 1.7 - 7.7 K/uL   Lymphocytes Relative 7 %   Lymphs Abs 0.5 (L) 0.7 - 4.0 K/uL   Monocytes Relative 12 %   Monocytes Absolute 0.9 0.1 - 1.0 K/uL   Eosinophils Relative 3 %   Eosinophils Absolute 0.2 0.0 - 0.5  K/uL   Basophils Relative 2 %   Basophils Absolute 0.1 0.0 - 0.1 K/uL   Immature Granulocytes 2 %   Abs Immature Granulocytes 0.15 (H) 0.00 - 0.07 K/uL    Comment: Performed at [facility], [place] [address], [place], [place] [address]  Comprehensive metabolic panel     Status: Abnormal   Collection Time: 11/21/21 11:47 PM  Result Value Ref Range   Sodium 132 (L) 135 - 145 mmol/L   Potassium 6.0 (H) 3.5 - 5.1 mmol/L   Chloride 92 (L) 98 - 111 mmol/L   CO2 21 (L) 22 - 32 mmol/L   Glucose, Bld 106 (H) 70 - 99 mg/dL    Comment: Glucose reference range applies only to samples taken after fasting for at least 8 hours.   BUN 71 (H) 8 - 23 mg/dL   Creatinine, Ser 7.19 (H) 0.61 - 1.24 mg/dL   Calcium 8.8 (L) 8.9 - 10.3 mg/dL   Total Protein 8.4 (H) 6.5 - 8.1 g/dL   Albumin 3.4 (L) 3.5 - 5.0 g/dL   AST 42 (H) 15 - 41 U/L   ALT 29 0 - 44 U/L   Alkaline Phosphatase 194 (H) 38 - 126 U/L   Total Bilirubin 1.4 (H) 0.3 - 1.2 mg/dL   GFR, Estimated 8 (L) >60 mL/min    Comment: (NOTE) Calculated using the CKD-EPI Creatinine Equation (2021)    Anion gap 19 (H) 5 - 15    Comment: Performed at [facility], [place] [address], [place], [place] [address]  Reticulocytes     Status: Abnormal    Collection Time: 11/21/21 11:47 PM  Result Value Ref Range   Retic Ct Pct 4.2 (H) 0.4 - 3.1 %   RBC. 2.42 (L) 4.22 - 5.81 MIL/uL   Retic Count, Absolute 101.2 19.0 - 186.0 K/uL   Immature Retic Fract 36.4 (H) 2.3 - 15.9 %    Comment: Performed at [facility] [address] [place], [place] [address]  Renal function panel     Status: Abnormal   Collection Time: 11/22/21  3:50 AM  Result Value Ref Range   Sodium 130 (L) 135 - 145 mmol/L   Potassium 6.6 (HH) 3.5 - 5.1 mmol/L    Comment: NO VISIBLE HEMOLYSIS CRITICAL RESULT CALLED TO, READ BACK BY AND VERIFIED WITH: LAMBERENG J,RN 11/22/21 0503 WAYK    Chloride 92 (L) 98 - 111 mmol/L   CO2 20 (L) 22 - 32 mmol/L   Glucose, Bld 97 70 - 99 mg/dL    Comment: Glucose reference range applies only to samples taken after fasting for at least 8 hours.   BUN 74 (H) 8 - 23 mg/dL   Creatinine, Ser 7.23 (H) 0.61 - 1.24 mg/dL   Calcium 8.6 (L) 8.9 - 10.3 mg/dL   Phosphorus 6.8 (H) 2.5 - 4.6 mg/dL   Albumin 3.1 (L) 3.5 - 5.0 g/dL   GFR, Estimated 8 (L) >60 mL/min    Comment: (NOTE) Calculated using the CKD-EPI Creatinine Equation (2021)    Anion gap 18 (H) 5 - 15    Comment: Performed at [facility] [address], [place], [place] [address]  CBC with Differential/Platelet     Status: Abnormal   Collection Time: 11/22/21  3:50 AM  Result Value Ref Range   WBC 8.5 4.0 - 10.5 K/uL   RBC 2.13 (L) 4.22 - 5.81 MIL/uL   Hemoglobin 7.9 (L) 13.0 - 17.0 g/dL  HCT 21.9 (L) 39.0 - 52.0 %   MCV 102.8 (H) 80.0 - 100.0 fL   MCH 37.1 (H) 26.0 - 34.0 pg   MCHC 36.1 (H) 30.0 - 36.0 g/dL   RDW 18.7 (H) 11.5 - 15.5 %   Platelets 174 150 - 400 K/uL   nRBC 5.3 (H) 0.0 - 0.2 %   Neutrophils Relative % 77 %   Neutro Abs 6.6 1.7 - 7.7 K/uL   Lymphocytes Relative 6 %   Lymphs Abs 0.5 (L) 0.7 - 4.0 K/uL   Monocytes Relative 12 %   Monocytes Absolute 1.0 0.1 - 1.0 K/uL   Eosinophils Relative 2 %   Eosinophils Absolute 0.2 0.0 - 0.5 K/uL    Basophils Relative 1 %   Basophils Absolute 0.1 0.0 - 0.1 K/uL   Immature Granulocytes 2 %   Abs Immature Granulocytes 0.15 (H) 0.00 - 0.07 K/uL    Comment: Performed at [facility], [address], [place], [place] [address]  Magnesium     Status: None   Collection Time: 11/22/21  3:50 AM  Result Value Ref Range   Magnesium 2.1 1.7 - 2.4 mg/dL    Comment: Performed at [facility] [address] [place], [place] [address]  Glucose, capillary     Status: Abnormal   Collection Time: 11/22/21  6:38 AM  Result Value Ref Range   Glucose-Capillary 109 (H) 70 - 99 mg/dL    Comment: Glucose reference range applies only to samples taken after fasting for at least 8 hours.  Potassium     Status: Abnormal   Collection Time: 11/22/21  6:55 AM  Result Value Ref Range   Potassium 5.9 (H) 3.5 - 5.1 mmol/L    Comment: Performed at [facility] [address] [place], [place] [address]  Hepatitis B surface antigen     Status: None   Collection Time: 11/22/21  7:05 AM  Result Value Ref Range   Hepatitis B Surface Ag NON REACTIVE NON REACTIVE    Comment: Performed at [facility] [address], [place], [place] [address]  Hepatitis B surface antibody     Status: Abnormal   Collection Time: 11/22/21  7:05 AM  Result Value Ref Range   Hep B S Ab Reactive (A) NON REACTIVE    Comment: (NOTE) Consistent with immunity, greater than 9.9 mIU/mL.  Performed at [facility], [place] [address] [place], [place] [address]   Hepatitis B core antibody, total     Status: None   Collection Time: 11/22/21  7:05 AM  Result Value Ref Range   Hep B Core Total Ab NON REACTIVE NON REACTIVE    Comment: Performed at [facility] [address], [place], [place] [address]  Hepatitis C antibody     Status: None   Collection Time: 11/22/21  7:05 AM  Result Value Ref Range   HCV Ab NON REACTIVE NON REACTIVE    Comment: (NOTE) Nonreactive HCV antibody screen is consistent with  no HCV infections,  unless recent infection is suspected or other evidence exists to indicate HCV infection.  Performed at [facility], [place] [address], [place], [place] [address]   .  ROS: See HPi   Physical Exam: Vitals:   11/22/21 0713 11/22/21 0827  BP:  120/81  Pulse:  98  Resp: 16 (!) 21  Temp:  97.9 F (36.6 C)  SpO2: 95% 98%  General: Thin chronically ill African-American male on HD and not in distress HEENT: [place], PERRLA, nonicteric, MM dry Neck: No JVD Heart: Irreg Tachy,  no MRG Lungs: CTA anterior nonlabored nasal cannula oxygen Abdomen: BS soft NT ND Extremities: Trace bipedal edema Skin: Abdominal wall scar healed no other overt rash or ulcers appreciated Neuro: Alert O x3 no asterixis no acute focal deficits appreciated Dialysis Access: Patent on HD RUA AV fistula  Dialysis Orders: [facility] TTS 4-hour  EDW 57 kg 3K, 2.5 CA bath  R UA AVF  No heparin  Mircera 225 last 601  Hectorol 4 mics  Sensipar 30 mg q. dialysis  Assessment/Plan Hyperkalemia in the setting of missed dialysis= HD today follow-up labs  ESRD -HD normal schedule TTS= schedule today because of hyperkalemia missed yesterday sickle cell crisis, tomorrow Thursday, short treatment to keep on schedule Sickle cell crisis= plan per admit team, currently using IV Dilaudid PCA Hypertension/volume  -BP stable on dialysis 102/55, NA 130 minimal pedal edema UF as able on dialysis , current  BP stable, home med torsemide listed, not on.  Hyponatremia= as noted above sitting missed dialysis mild volume up , but as tolerated follow-up NA Labs Anemia of ESRD also sickle cell= last given max Mircera 6/01 follow-up trend Metabolic bone disease -CA 9.3 phosphorus 6.8 continue Hectorol and phosphate binder nutrition -  A-fib= no anticoag meds, or rate meds / last Admit had Metop/  give 5mg  x 1 now with 120-130 rt. / Pain may be driving Tachy  rate . Now   Ernest Haber, PA-C [place] [phone] 11/22/2021, 9:45 AM

## 2021-11-22 NOTE — ED Notes (Signed)
ED TO INPATIENT HANDOFF REPORT  ED Nurse Name and Phone #: [facility] Name/Age/Gender Patrick [name] 66 y.o. male Room/Bed: 025C/025C  Code Status   Code Status: Full Code  Home/SNF/Other Home Patient oriented to: self, place, time, and situation Is this baseline? Yes   Triage Complete: Triage complete  Chief Complaint Sickle cell pain crisis [facility]) [D57.00]  Triage Note Pt was just discharged for a sickle cell crisis and told to go straight to dialysis and to follow up with his doctor about getting pain medication. Pt is back stating he has pain all over. Pt states he did not go to dialysis and his doctor wont help him for another 72 hrs they said.    Allergies No Known Allergies  Level of Care/Admitting Diagnosis ED Disposition     ED Disposition  Admit   Condition  --   Comment  Hospital Area: [place] [[id number]]  Level of Care: Telemetry Medical [104]  May place patient in observation at [facility] or [place] if equivalent level of care is available:: Yes  Covid Evaluation: Asymptomatic - no recent exposure (last 10 days) testing not required  Diagnosis: Sickle cell pain crisis [facility]) [[id number]]  Admitting Physician: Kayleen Memos [[id number]]  Attending Physician: Kayleen Memos [[id number]]          B Medical/Surgery History Past Medical History:  Diagnosis Date   Blood dyscrasia    Blood transfusion    "I've had a bunch of them"   CHF (congestive heart failure) ([place])    followed by hf clinic   Chronic kidney disease (CKD), stage III (moderate) ([place])    Dialysis patient ([place]) 04/2019   AV fistula (right arm)   Gout    Headache(784.0)    "probably weekly" (01/13/2014)   Legally blind    Shortness of breath dyspnea    Sickle cell disease, type SS (HCC)    Swelling abdomen    Swelling of extremity, left    Swelling of extremity, right    Past Surgical History:  Procedure Laterality Date   A/V FISTULAGRAM Right  07/29/2020   Procedure: A/V FISTULAGRAM - Right Arm;  Surgeon: Angelia Mould, MD;  Location: [place] CV [facility];  Service: Cardiovascular;  Laterality: Right;   BASCILIC VEIN TRANSPOSITION Right 04/12/2014   Procedure: BASILIC VEIN TRANSPOSITION;  Surgeon: Elam Dutch, MD;  Location: [place];  Service: Vascular;  Laterality: Right;   BIOPSY  08/04/2020   Procedure: BIOPSY;  Surgeon: Ladene Artist, MD;  Location: [facility] ENDOSCOPY;  Service: Gastroenterology;;   BIOPSY  06/28/2021   Procedure: BIOPSY;  Surgeon: Jerene Bears, MD;  Location: [facility] ENDOSCOPY;  Service: Gastroenterology;;   BOWEL RESECTION N/A 05/24/2021   Procedure: SMALL BOWEL RESECTION;  Surgeon: Rolm Bookbinder, MD;  Location: [place];  Service: General;  Laterality: N/A;   CARDIAC CATHETERIZATION  05/2011   CARDIOVERSION N/A 06/05/2018   Procedure: CARDIOVERSION;  Surgeon: Jolaine Artist, MD;  Location: [place];  Service: Cardiovascular;  Laterality: N/A;   CARDIOVERSION N/A 10/06/2019   Procedure: CARDIOVERSION;  Surgeon: Jolaine Artist, MD;  Location: [facility] ENDOSCOPY;  Service: Cardiovascular;  Laterality: N/A;   CHOLECYSTECTOMY N/A 05/24/2021   Procedure: OPEN CHOLECYSTECTOMY;  Surgeon: Rolm Bookbinder, MD;  Location: [place];  Service: General;  Laterality: N/A;   COLONOSCOPY WITH PROPOFOL N/A 09/24/2017   Procedure: COLONOSCOPY WITH PROPOFOL;  Surgeon: Jackquline Denmark, MD;  Location: [place];  Service: Endoscopy;  Laterality:  N/A;   ESOPHAGOGASTRODUODENOSCOPY N/A 09/19/2017   Procedure: ESOPHAGOGASTRODUODENOSCOPY (EGD);  Surgeon: Jackquline Denmark, MD;  Location: [facility] ENDOSCOPY;  Service: Endoscopy;  Laterality: N/A;   ESOPHAGOGASTRODUODENOSCOPY N/A 05/29/2021   Procedure: ESOPHAGOGASTRODUODENOSCOPY (EGD);  Surgeon: [place] Banister, MD;  Location: [facility] ENDOSCOPY;  Service: Gastroenterology;  Laterality: N/A;   ESOPHAGOGASTRODUODENOSCOPY (EGD) WITH PROPOFOL N/A 08/04/2020   Procedure: ESOPHAGOGASTRODUODENOSCOPY  (EGD) WITH PROPOFOL;  Surgeon: Ladene Artist, MD;  Location: [place];  Service: Gastroenterology;  Laterality: N/A;   ESOPHAGOGASTRODUODENOSCOPY (EGD) WITH PROPOFOL N/A 06/28/2021   Procedure: ESOPHAGOGASTRODUODENOSCOPY (EGD) WITH PROPOFOL;  Surgeon: Jerene Bears, MD;  Location: [facility] ENDOSCOPY;  Service: Gastroenterology;  Laterality: N/A;   ESOPHAGOGASTRODUODENOSCOPY (EGD) WITH PROPOFOL N/A 08/10/2021   Procedure: ESOPHAGOGASTRODUODENOSCOPY (EGD) WITH PROPOFOL;  Surgeon: Jackquline Denmark, MD;  Location: [place];  Service: Gastroenterology;  Laterality: N/A;   ESOPHAGOGASTRODUODENOSCOPY (EGD) WITH PROPOFOL N/A 08/02/2021   Procedure: ESOPHAGOGASTRODUODENOSCOPY (EGD) WITH PROPOFOL;  Surgeon: Lavena Bullion, DO;  Location: [place];  Service: Gastroenterology;  Laterality: N/A;   HEMOSTASIS CLIP PLACEMENT  05/29/2021   Procedure: HEMOSTASIS CLIP PLACEMENT;  Surgeon: [place] Banister, MD;  Location: [facility] ENDOSCOPY;  Service: Gastroenterology;;   HOT HEMOSTASIS N/A 05/29/2021   Procedure: HOT HEMOSTASIS (ARGON PLASMA COAGULATION/BICAP);  Surgeon: [place] Banister, MD;  Location: [facility] ENDOSCOPY;  Service: Gastroenterology;  Laterality: N/A;   HOT HEMOSTASIS N/A 06/28/2021   Procedure: HOT HEMOSTASIS (ARGON PLASMA COAGULATION/BICAP);  Surgeon: Jerene Bears, MD;  Location: [facility] ENDOSCOPY;  Service: Gastroenterology;  Laterality: N/A;   HOT HEMOSTASIS N/A 08/10/2021   Procedure: HOT HEMOSTASIS (ARGON PLASMA COAGULATION/BICAP);  Surgeon: Jackquline Denmark, MD;  Location: [facility] ENDOSCOPY;  Service: Gastroenterology;  Laterality: N/A;   HOT HEMOSTASIS N/A 08/02/2021   Procedure: HOT HEMOSTASIS (ARGON PLASMA COAGULATION/BICAP);  Surgeon: Lavena Bullion, DO;  Location: [facility] ENDOSCOPY;  Service: Gastroenterology;  Laterality: N/A;   IR PERC TUN PERIT CATH WO PORT S&I /IMAG  05/23/2021   IR REMOVAL TUN CV CATH W/O FL  06/10/2021   IR US GUIDE VASC ACCESS RIGHT  05/23/2021   LAPAROSCOPY N/A 05/24/2021    Procedure: LAPAROSCOPY DIAGNOSTIC;  Surgeon: Rolm Bookbinder, MD;  Location: [place];  Service: General;  Laterality: N/A;   LAPAROTOMY N/A 05/24/2021   Procedure: EXPLORATORY LAPAROTOMY;  Surgeon: Rolm Bookbinder, MD;  Location: [facility];  Service: General;  Laterality: N/A;   LEFT AND RIGHT HEART CATHETERIZATION WITH CORONARY ANGIOGRAM N/A 06/11/2011   Procedure: LEFT AND RIGHT HEART CATHETERIZATION WITH CORONARY ANGIOGRAM;  Surgeon: Larey Dresser, MD;  Location: [facility] CATH LAB;  Service: Cardiovascular;  Laterality: N/A;   PERIPHERAL VASCULAR BALLOON ANGIOPLASTY Right 07/29/2020   Procedure: PERIPHERAL VASCULAR BALLOON ANGIOPLASTY;  Surgeon: Angelia Mould, MD;  Location: [place] [facility] LAB;  Service: Cardiovascular;  Laterality: Right;  arm fistula   REVISON OF ARTERIOVENOUS FISTULA Right 08/08/2020   Procedure: ANEURYSM EXCISION OF RIGHT UPPER EXTREMITY ARTERIOVENOUS FISTULA;  Surgeon: Angelia Mould, MD;  Location: [facility] OR;  Service: Vascular;  Laterality: Right;   SCLEROTHERAPY  05/29/2021   Procedure: Clide Deutscher;  Surgeon: [place] Banister, MD;  Location: [facility] ENDOSCOPY;  Service: Gastroenterology;;   Lavell Islam REMOVAL  05/29/2021   Procedure: STENT REMOVAL;  Surgeon: [place] Banister, MD;  Location: [facility] ENDOSCOPY;  Service: Gastroenterology;;   TEE WITHOUT CARDIOVERSION N/A 12/27/2015   Procedure: TRANSESOPHAGEAL ECHOCARDIOGRAM (TEE);  Surgeon: Sueanne Margarita, MD;  Location: [facility] ENDOSCOPY;  Service: Cardiovascular;  Laterality: N/A;     A IV Location/Drains/Wounds Patient  Lines/Drains/Airways Status     Active Line/Drains/Airways     Name Placement date Placement time Site Days   Peripheral IV 11/21/21 20 G Anterior;Left Forearm 11/21/21  2349  Forearm  1   Fistula / Graft Right Upper arm --  --  Upper arm  --   Fistula / Graft Right Upper arm Arteriovenous fistula 08/01/21  0354  Upper arm  113   Incision (Closed) 06/03/21 Flank Left;Lower 06/03/21  1742  -- 172   Wound /  Incision (Open or Dehisced) 06/03/21 Abdomen Left;Medial;Upper 06/03/21  1743  Abdomen  172            Intake/Output Last 24 hours No intake or output data in the 24 hours ending 11/22/21 0354  Labs/Imaging Results for orders placed or performed during the hospital encounter of 11/21/21 (from the past 48 hour(s))  CBC with Differential     Status: Abnormal   Collection Time: 11/21/21 11:47 PM  Result Value Ref Range   WBC 7.3 4.0 - 10.5 K/uL   RBC 2.43 (L) 4.22 - 5.81 MIL/uL   Hemoglobin 9.0 (L) 13.0 - 17.0 g/dL   HCT 25.2 (L) 39.0 - 52.0 %   MCV 103.7 (H) 80.0 - 100.0 fL   MCH 37.0 (H) 26.0 - 34.0 pg   MCHC 35.7 30.0 - 36.0 g/dL   RDW 18.6 (H) 11.5 - 15.5 %   Platelets 192 150 - 400 K/uL   nRBC 5.9 (H) 0.0 - 0.2 %   Neutrophils Relative % 74 %   Neutro Abs 5.4 1.7 - 7.7 K/uL   Lymphocytes Relative 7 %   Lymphs Abs 0.5 (L) 0.7 - 4.0 K/uL   Monocytes Relative 12 %   Monocytes Absolute 0.9 0.1 - 1.0 K/uL   Eosinophils Relative 3 %   Eosinophils Absolute 0.2 0.0 - 0.5 K/uL   Basophils Relative 2 %   Basophils Absolute 0.1 0.0 - 0.1 K/uL   Immature Granulocytes 2 %   Abs Immature Granulocytes 0.15 (H) 0.00 - 0.07 K/uL    Comment: Performed at [facility], [address], [place], [place] [address]  Comprehensive metabolic panel     Status: Abnormal   Collection Time: 11/21/21 11:47 PM  Result Value Ref Range   Sodium 132 (L) 135 - 145 mmol/L   Potassium 6.0 (H) 3.5 - 5.1 mmol/L   Chloride 92 (L) 98 - 111 mmol/L   CO2 21 (L) 22 - 32 mmol/L   Glucose, Bld 106 (H) 70 - 99 mg/dL    Comment: Glucose reference range applies only to samples taken after fasting for at least 8 hours.   BUN 71 (H) 8 - 23 mg/dL   Creatinine, Ser 7.19 (H) 0.61 - 1.24 mg/dL   Calcium 8.8 (L) 8.9 - 10.3 mg/dL   Total Protein 8.4 (H) 6.5 - 8.1 g/dL   Albumin 3.4 (L) 3.5 - 5.0 g/dL   AST 42 (H) 15 - 41 U/L   ALT 29 0 - 44 U/L   Alkaline Phosphatase 194 (H) 38 - 126 U/L   Total Bilirubin 1.4  (H) 0.3 - 1.2 mg/dL   GFR, Estimated 8 (L) >60 mL/min    Comment: (NOTE) Calculated using the CKD-EPI Creatinine Equation (2021)    Anion gap 19 (H) 5 - 15    Comment: Performed at [facility], [place] [address] [place], [place] [address]  Reticulocytes     Status: Abnormal   Collection Time: 11/21/21 11:47  PM  Result Value Ref Range   Retic Ct Pct 4.2 (H) 0.4 - 3.1 %   RBC. 2.42 (L) 4.22 - 5.81 MIL/uL   Retic Count, Absolute 101.2 19.0 - 186.0 K/uL   Immature Retic Fract 36.4 (H) 2.3 - 15.9 %    Comment: Performed at [facility] [address] [place], [place] [address]   DG Chest Portable 1 View  Result Date: 11/22/2021 CLINICAL DATA:  Fluid overload. EXAM: PORTABLE CHEST 1 VIEW COMPARISON:  November 21, 2021 (6:48 a.m.) FINDINGS: The cardiac silhouette is moderately enlarged and unchanged in size. There is marked severity calcification of the aortic arch. Diffuse, chronic appearing increased interstitial lung markings are seen. There is no evidence of focal consolidation, pleural effusion or pneumothorax. The visualized skeletal structures are unremarkable. IMPRESSION: 1. Stable cardiomegaly without evidence of acute or active cardiopulmonary disease. Electronically Signed   By: Virgina Norfolk M.D.   On: 11/22/2021 00:10   DG Chest Port 1 View  Result Date: 11/21/2021 CLINICAL DATA:  Sickle cell crisis.  Chest pain. EXAM: PORTABLE CHEST 1 VIEW COMPARISON:  09/13/2021 FINDINGS: Stable cardiac enlargement. Aortic atherosclerosis. Chronic interstitial coarsening is identified bilaterally. No superimposed pleural effusion, interstitial edema or airspace consolidation. The visualized osseous structures are unremarkable. IMPRESSION: 1. No acute cardiopulmonary abnormalities. 2. Chronic interstitial coarsening. Electronically Signed   By: Kerby Moors M.D.   On: 11/21/2021 07:15    Pending Labs Unresulted Labs (From admission, onward)     Start     Ordered   11/22/21 0500  Renal  function panel  Tomorrow morning,   R        11/22/21 0347   11/22/21 0500  CBC with Differential/Platelet  Tomorrow morning,   R        11/22/21 0347   11/22/21 0500  Magnesium  Tomorrow morning,   R        11/22/21 0347            Vitals/Pain Today's Vitals   11/22/21 0300 11/22/21 0315 11/22/21 0330 11/22/21 0342  BP: 128/79 109/86 118/89   Pulse: 84 86 80   Resp: 13 15 19    SpO2: 98% 97% 94%   Weight:      Height:      PainSc:    Asleep    Isolation Precautions No active isolations  Medications Medications  heparin injection 5,000 Units (has no administration in time range)  acetaminophen (TYLENOL) tablet 500 mg (has no administration in time range)  allopurinol (ZYLOPRIM) tablet 100 mg (has no administration in time range)  cinacalcet (SENSIPAR) tablet 30 mg (has no administration in time range)  fluticasone (FLONASE) 50 MCG/ACT nasal spray 2 spray (has no administration in time range)  folic acid (FOLVITE) tablet 1 mg (has no administration in time range)  polyvinyl alcohol (LIQUIFILM TEARS) 1.4 % ophthalmic solution 1 drop (has no administration in time range)  hydroxyurea (HYDREA) capsule 500 mg (has no administration in time range)  Vitamin D (Ergocalciferol) (DRISDOL) capsule 50,000 Units (has no administration in time range)  senna-docusate (Senokot-S) tablet 2 tablet (has no administration in time range)  naloxone (NARCAN) injection 0.4 mg (has no administration in time range)  HYDROmorphone (DILAUDID) injection 0.5 mg (has no administration in time range)  oxyCODONE (Oxy IR/ROXICODONE) immediate release tablet 10 mg (10 mg Oral Given 11/22/21 0310)  HYDROmorphone (DILAUDID) injection 1 mg (1 mg Intravenous Given 11/22/21 0011)  sodium zirconium cyclosilicate (LOKELMA) packet 10 g (10 g Oral  Given 11/22/21 0040)  HYDROmorphone (DILAUDID) injection 1 mg (1 mg Intravenous Given 11/22/21 0118)    Mobility walks with person assist Low fall risk   Focused  Assessments Neuro Assessment Handoff:  Swallow screen pass? Yes          Neuro Assessment: Within Defined Limits Neuro Checks:      Last Documented NIHSS Modified Score:   Has TPA been given? No If patient is a Neuro Trauma and patient is going to OR before floor call report to [place] nurse: [phone] or [phone]   R Recommendations: See Admitting Provider Note  Report given to:   Additional Notes: pt is AAOx4. Pt is on room air.

## 2021-11-22 NOTE — ED Provider Notes (Signed)
[facility] EMERGENCY DEPARTMENT Provider Note   CSN: [id number] Arrival date & time: 11/21/21  1821     History  Chief Complaint  Patient presents with   Sickle Cell Pain Crisis    Marquin D Alcorn is a 66 y.o. male.  66 year old male who has a history of sickle cell thalassemia, CHF, end-stage renal disease on dialysis and gout who presents to the ER today secondary to diffuse body pain.  Patient was seen in the ER earlier today.  He was given home doses of his oxycodone and he states no relief in his pain but he had to go to dialysis so he left.  Was unable to get a ride to his dialysis has pain and not getting better so he presents here for further evaluation.  States he feels similar to his previous sickle cell.  He states he has not had any home medications for a few days because he ran out and he called them this morning but will take about 72 hours to get his medications refilled.  Review of his narcotic prescribing record it does appear that he gets all his medications from the same physicians and gets them at appropriate timing intervals.  No shortness of breath or chest pain at this time.   Sickle Cell Pain Crisis     Home Medications Prior to Admission medications   Medication Sig Start Date End Date Taking? Authorizing Provider  acetaminophen (TYLENOL) 500 MG tablet Take 500 mg by mouth every 6 (six) hours as needed for moderate pain.    [provider]  allopurinol (ZYLOPRIM) 100 MG tablet Take 1 tablet (100 mg total) by mouth daily. 11/08/21   Dorena Dew, FNP  AURYXIA 1 GM 210 MG(Fe) tablet Take 210 mg by mouth 3 (three) times daily. 02/11/21   [provider]  cinacalcet (SENSIPAR) 30 MG tablet Take 1 tablet (30 mg total) by mouth Every Tuesday,Thursday,and Saturday with dialysis. 08/31/21 11/29/21  Dahal, Marlowe Aschoff, MD  fluticasone (FLONASE) 50 MCG/ACT nasal spray Place 2 sprays into both nostrils daily. 09/04/21   Passmore, Jake Church  I, NP  folic acid (FOLVITE) 1 MG tablet TAKE 1 TABLET BY MOUTH EVERY DAY Patient taking differently: Take 1 mg by mouth daily. 02/22/21   Dorena Dew, FNP  Glycerin-Hypromellose-PEG 400 (VISINE DRY EYE OP) Place 1 drop into both eyes as needed (dryness).    [provider]  hydroxyurea (HYDREA) 500 MG capsule Take 1 capsule (500 mg total) by mouth daily. 11/08/21   Dorena Dew, FNP  lidocaine-prilocaine (EMLA) cream Apply 1 application. topically Every Tuesday,Thursday,and Saturday with dialysis. 09/08/19   [provider]  multivitamin (RENA-VIT) TABS tablet Take 1 tablet by mouth at bedtime. Patient not taking: Reported on 09/28/2021 08/30/21 11/28/21  Terrilee Croak, MD  Nutritional Supplements (,FEEDING SUPPLEMENT, PROSOURCE PLUS) liquid Take 30 mLs by mouth 2 (two) times daily between meals. 08/30/21   Terrilee Croak, MD  Oxycodone HCl 10 MG TABS Take 1 tablet (10 mg total) by mouth every 6 (six) hours as needed. 10/20/21   Dorena Dew, FNP  pantoprazole (PROTONIX) 40 MG tablet TAKE 1 TABLET BY MOUTH TWICE A DAY 11/16/21   Willia Craze, NP  tiZANidine (ZANAFLEX) 4 MG tablet Take 0.5 tablets (2 mg total) by mouth every 8 (eight) hours as needed for muscle spasms. 07/18/21   Dorena Dew, FNP  torsemide (DEMADEX) 20 MG tablet Take 80 mg by mouth daily. 09/03/21   [provider]  Vitamin D, Ergocalciferol, (DRISDOL) 1.25 MG (50000 UNIT) CAPS capsule Take 1 capsule (50,000 Units total) by mouth once a week. Monday 11/08/21   Dorena Dew, FNP      Allergies    Patient has no known allergies.    Review of Systems   Review of Systems  Physical Exam Updated Vital Signs BP 119/88   Pulse 84   Resp 19   Ht 5\' 11"  (1.803 m)   Wt 60.8 kg   SpO2 99%   BMI 18.69 kg/m  Physical Exam Vitals and nursing note reviewed.  Constitutional:      Appearance: He is well-developed.  HENT:     Head: Normocephalic and atraumatic.     Mouth/Throat:      Mouth: Mucous membranes are moist.  Cardiovascular:     Rate and Rhythm: Normal rate.  Pulmonary:     Effort: Pulmonary effort is normal. No respiratory distress.  Abdominal:     General: There is no distension.  Musculoskeletal:        General: Normal range of motion.     Cervical back: Normal range of motion.  Skin:    General: Skin is warm and dry.  Neurological:     Mental Status: He is alert.    ED Results / Procedures / Treatments   Labs (all labs ordered are listed, but only abnormal results are displayed) Labs Reviewed  CBC WITH DIFFERENTIAL/PLATELET - Abnormal; Notable for the following components:      Result Value   RBC 2.43 (*)    Hemoglobin 9.0 (*)    HCT 25.2 (*)    MCV 103.7 (*)    MCH 37.0 (*)    RDW 18.6 (*)    nRBC 5.9 (*)    Lymphs Abs 0.5 (*)    Abs Immature Granulocytes 0.15 (*)    All other components within normal limits  COMPREHENSIVE METABOLIC PANEL - Abnormal; Notable for the following components:   Sodium 132 (*)    Potassium 6.0 (*)    Chloride 92 (*)    CO2 21 (*)    Glucose, Bld 106 (*)    BUN 71 (*)    Creatinine, Ser 7.19 (*)    Calcium 8.8 (*)    Total Protein 8.4 (*)    Albumin 3.4 (*)    AST 42 (*)    Alkaline Phosphatase 194 (*)    Total Bilirubin 1.4 (*)    GFR, Estimated 8 (*)    Anion gap 19 (*)    All other components within normal limits  RETICULOCYTES - Abnormal; Notable for the following components:   Retic Ct Pct 4.2 (*)    RBC. 2.42 (*)    Immature Retic Fract 36.4 (*)    All other components within normal limits    EKG EKG Interpretation  Date/Time:  Wednesday November 22 2021 00:22:07 EDT Ventricular Rate:  85 PR Interval:    QRS Duration: 104 QT Interval:  425 QTC Calculation: 506 R Axis:   90 Text Interpretation: Atrial fibrillation Borderline right axis deviation Prolonged QT interval Confirmed by Merrily Pew [phone]) on 11/22/2021 12:33:33 AM  Radiology DG Chest Portable 1 View  Result Date:  11/22/2021 CLINICAL DATA:  Fluid overload. EXAM: PORTABLE CHEST 1 VIEW COMPARISON:  November 21, 2021 (6:48 a.m.) FINDINGS: The cardiac silhouette is moderately enlarged and unchanged in size. There is marked severity calcification of the aortic arch. Diffuse, chronic appearing increased interstitial lung markings are seen.  There is no evidence of focal consolidation, pleural effusion or pneumothorax. The visualized skeletal structures are unremarkable. IMPRESSION: 1. Stable cardiomegaly without evidence of acute or active cardiopulmonary disease. Electronically Signed   By: Virgina Norfolk M.D.   On: 11/22/2021 00:10   DG Chest Port 1 View  Result Date: 11/21/2021 CLINICAL DATA:  Sickle cell crisis.  Chest pain. EXAM: PORTABLE CHEST 1 VIEW COMPARISON:  09/13/2021 FINDINGS: Stable cardiac enlargement. Aortic atherosclerosis. Chronic interstitial coarsening is identified bilaterally. No superimposed pleural effusion, interstitial edema or airspace consolidation. The visualized osseous structures are unremarkable. IMPRESSION: 1. No acute cardiopulmonary abnormalities. 2. Chronic interstitial coarsening. Electronically Signed   By: Kerby Moors M.D.   On: 11/21/2021 07:15    Procedures Procedures    Medications Ordered in ED Medications  heparin injection 5,000 Units (has no administration in time range)  HYDROmorphone (DILAUDID) injection 1 mg (1 mg Intravenous Given 11/22/21 0011)  sodium zirconium cyclosilicate (LOKELMA) packet 10 g (10 g Oral Given 11/22/21 0040)  HYDROmorphone (DILAUDID) injection 1 mg (1 mg Intravenous Given 11/22/21 0118)    ED Course/ Medical Decision Making/ A&P                           Medical Decision Making Amount and/or Complexity of Data Reviewed Labs: ordered. Radiology: ordered. ECG/medicine tests: ordered.  Risk Prescription drug management. Decision regarding hospitalization.   Here with what seems to be a sickle cell pain crisis not completely treated this  morning as he will need to go to dialysis but could not get a ride.  Now his potassium is also 6.0 compared to 5.3 earlier today.  No EKG changes.  Dose of Lokelma given.  Spoke with Dr. Johnney Ou, will see in AM for likely dialysis at that time.  Low hemoglobin but similar to baseline. Retic somewhat elevated c/w SCD.  Rest of labs reviewed and similar to baseline.  Patient pain not controlled, will give another dose of dilaudid. D/w Dr. Nevada Crane with Medicine for admission.   Final Clinical Impression(s) / ED Diagnoses Final diagnoses:  Sickle cell pain crisis ([place])  Hyperkalemia    Rx / DC Orders ED Discharge Orders     None         Theseus Birnie, [name], MD 11/22/21 0127

## 2021-11-23 DIAGNOSIS — D631 Anemia in chronic kidney disease: Secondary | ICD-10-CM | POA: Diagnosis present

## 2021-11-23 DIAGNOSIS — N186 End stage renal disease: Secondary | ICD-10-CM | POA: Diagnosis present

## 2021-11-23 DIAGNOSIS — Z79899 Other long term (current) drug therapy: Secondary | ICD-10-CM | POA: Diagnosis not present

## 2021-11-23 DIAGNOSIS — Z992 Dependence on renal dialysis: Secondary | ICD-10-CM | POA: Diagnosis not present

## 2021-11-23 DIAGNOSIS — I272 Pulmonary hypertension, unspecified: Secondary | ICD-10-CM | POA: Diagnosis present

## 2021-11-23 DIAGNOSIS — Z9049 Acquired absence of other specified parts of digestive tract: Secondary | ICD-10-CM | POA: Diagnosis not present

## 2021-11-23 DIAGNOSIS — I132 Hypertensive heart and chronic kidney disease with heart failure and with stage 5 chronic kidney disease, or end stage renal disease: Secondary | ICD-10-CM | POA: Diagnosis present

## 2021-11-23 DIAGNOSIS — Z91158 Patient's noncompliance with renal dialysis for other reason: Secondary | ICD-10-CM | POA: Diagnosis not present

## 2021-11-23 DIAGNOSIS — K219 Gastro-esophageal reflux disease without esophagitis: Secondary | ICD-10-CM | POA: Diagnosis present

## 2021-11-23 DIAGNOSIS — E43 Unspecified severe protein-calorie malnutrition: Secondary | ICD-10-CM | POA: Diagnosis present

## 2021-11-23 DIAGNOSIS — Z87891 Personal history of nicotine dependence: Secondary | ICD-10-CM | POA: Diagnosis not present

## 2021-11-23 DIAGNOSIS — D5700 Hb-SS disease with crisis, unspecified: Secondary | ICD-10-CM | POA: Diagnosis not present

## 2021-11-23 DIAGNOSIS — E875 Hyperkalemia: Secondary | ICD-10-CM | POA: Diagnosis present

## 2021-11-23 DIAGNOSIS — I4821 Permanent atrial fibrillation: Secondary | ICD-10-CM | POA: Diagnosis present

## 2021-11-23 DIAGNOSIS — I428 Other cardiomyopathies: Secondary | ICD-10-CM | POA: Diagnosis present

## 2021-11-23 DIAGNOSIS — H548 Legal blindness, as defined in USA: Secondary | ICD-10-CM | POA: Diagnosis present

## 2021-11-23 DIAGNOSIS — I953 Hypotension of hemodialysis: Secondary | ICD-10-CM | POA: Diagnosis not present

## 2021-11-23 DIAGNOSIS — N2581 Secondary hyperparathyroidism of renal origin: Secondary | ICD-10-CM | POA: Diagnosis present

## 2021-11-23 DIAGNOSIS — E871 Hypo-osmolality and hyponatremia: Secondary | ICD-10-CM | POA: Diagnosis present

## 2021-11-23 DIAGNOSIS — Z811 Family history of alcohol abuse and dependence: Secondary | ICD-10-CM | POA: Diagnosis not present

## 2021-11-23 DIAGNOSIS — Z8249 Family history of ischemic heart disease and other diseases of the circulatory system: Secondary | ICD-10-CM | POA: Diagnosis not present

## 2021-11-23 DIAGNOSIS — D57419 Sickle-cell thalassemia with crisis, unspecified: Secondary | ICD-10-CM | POA: Diagnosis present

## 2021-11-23 DIAGNOSIS — G894 Chronic pain syndrome: Secondary | ICD-10-CM | POA: Diagnosis present

## 2021-11-23 DIAGNOSIS — I9589 Other hypotension: Secondary | ICD-10-CM | POA: Diagnosis present

## 2021-11-23 DIAGNOSIS — Z681 Body mass index (BMI) 19 or less, adult: Secondary | ICD-10-CM | POA: Diagnosis not present

## 2021-11-23 LAB — RENAL FUNCTION PANEL
Albumin: 3.1 g/dL — ABNORMAL LOW (ref 3.5–5.0)
Anion gap: 14 (ref 5–15)
BUN: 42 mg/dL — ABNORMAL HIGH (ref 8–23)
CO2: 25 mmol/L (ref 22–32)
Calcium: 8.4 mg/dL — ABNORMAL LOW (ref 8.9–10.3)
Chloride: 95 mmol/L — ABNORMAL LOW (ref 98–111)
Creatinine, Ser: 4.35 mg/dL — ABNORMAL HIGH (ref 0.61–1.24)
GFR, Estimated: 14 mL/min — ABNORMAL LOW (ref >60)
Glucose, Bld: 93 mg/dL (ref 70–99)
Phosphorus: 5.6 mg/dL — ABNORMAL HIGH (ref 2.5–4.6)
Potassium: 4 mmol/L (ref 3.5–5.1)
Sodium: 134 mmol/L — ABNORMAL LOW (ref 135–145)

## 2021-11-23 LAB — CBC
HCT: 21.4 % — ABNORMAL LOW (ref 39.0–52.0)
Hemoglobin: 8 g/dL — ABNORMAL LOW (ref 13.0–17.0)
MCH: 37.6 pg — ABNORMAL HIGH (ref 26.0–34.0)
MCHC: 37.4 g/dL — ABNORMAL HIGH (ref 30.0–36.0)
MCV: 100.5 fL — ABNORMAL HIGH (ref 80.0–100.0)
Platelets: 142 10*3/uL — ABNORMAL LOW (ref 150–400)
RBC: 2.13 MIL/uL — ABNORMAL LOW (ref 4.22–5.81)
RDW: 20 % — ABNORMAL HIGH (ref 11.5–15.5)
WBC: 10.9 10*3/uL — ABNORMAL HIGH (ref 4.0–10.5)
nRBC: 5.9 % — ABNORMAL HIGH (ref 0.0–0.2)

## 2021-11-23 LAB — HEPATITIS B SURFACE ANTIBODY, QUANTITATIVE: Hep B S AB Quant (Post): 324 m[IU]/mL (ref >9.9)

## 2021-11-23 LAB — POTASSIUM
Potassium: 4.4 mmol/L (ref 3.5–5.1)
Potassium: 4.7 mmol/L (ref 3.5–5.1)
Potassium: 5.9 mmol/L — ABNORMAL HIGH (ref 3.5–5.1)

## 2021-11-23 MED ORDER — OXYCODONE HCL 5 MG PO TABS
ORAL_TABLET | ORAL | Status: AC
  Filled 2021-11-23: qty 2

## 2021-11-23 MED ORDER — PENTAFLUOROPROP-TETRAFLUOROETH EX AERO: 1.0000 "application " | INHALATION_SPRAY | CUTANEOUS | Status: DC | PRN

## 2021-11-23 MED ORDER — OXYCODONE HCL 5 MG PO TABS: 10.0000 mg | ORAL_TABLET | ORAL | Status: DC | PRN

## 2021-11-23 MED ORDER — LIDOCAINE HCL (PF) 1 % IJ SOLN: 5.0000 mL | INTRAMUSCULAR | Status: DC | PRN

## 2021-11-23 MED ORDER — OXYCODONE HCL 5 MG PO TABS: 10.0000 mg | ORAL_TABLET | Freq: Four times a day (QID) | ORAL | Status: DC | PRN

## 2021-11-23 MED ORDER — ANTICOAGULANT SODIUM CITRATE 4% (200MG/5ML) IV SOLN
5.0000 mL | Status: DC | PRN
  Filled 2021-11-23: qty 5

## 2021-11-23 MED ORDER — ENSURE ENLIVE PO LIQD
237.0000 mL | Freq: Two times a day (BID) | ORAL | Status: DC
  Administered 2021-11-24 (×2): 237 mL via ORAL

## 2021-11-23 MED ORDER — POLYETHYLENE GLYCOL 3350 17 G PO PACK
17.0000 g | PACK | Freq: Every day | ORAL | Status: DC
  Administered 2021-11-23 – 2021-11-24 (×2): 17 g via ORAL
  Filled 2021-11-23 (×2): qty 1

## 2021-11-23 MED ORDER — LIDOCAINE-PRILOCAINE 2.5-2.5 % EX CREA: 1.0000 "application " | TOPICAL_CREAM | CUTANEOUS | Status: DC | PRN

## 2021-11-23 MED ORDER — OXYCODONE HCL 5 MG PO TABS
10.0000 mg | ORAL_TABLET | Freq: Once | ORAL | Status: AC
  Administered 2021-11-23: 10 mg via ORAL

## 2021-11-23 MED ORDER — METOPROLOL TARTRATE 5 MG/5ML IV SOLN
5.0000 mg | Freq: Four times a day (QID) | INTRAVENOUS | Status: DC | PRN
  Administered 2021-11-23 (×2): 5 mg via INTRAVENOUS
  Filled 2021-11-23 (×2): qty 5

## 2021-11-23 MED ORDER — HEPARIN SODIUM (PORCINE) 1000 UNIT/ML DIALYSIS: 1000.0000 [IU] | INTRAMUSCULAR | Status: DC | PRN

## 2021-11-23 MED ORDER — ALTEPLASE 2 MG IJ SOLR: 2.0000 mg | Freq: Once | INTRAMUSCULAR | Status: DC | PRN

## 2021-11-23 MED ORDER — ALBUMIN HUMAN 25 % IV SOLN: 25.0000 g | Freq: Once | INTRAVENOUS | Status: DC

## 2021-11-23 NOTE — Progress Notes (Signed)
PROGRESS NOTE    Patrick [name]  PQZ:[id number] DOB: 12-06-1955 DOA: 11/21/2021 PCP: [name], MD    Brief Narrative:  66 year old gentleman with history of sickle cell disease, chronic A-fib not on anticoagulation, chronic pain syndrome on opiates, ESRD on hemodialysis TTS schedule who missed his hemodialysis because of pain, he ran out of opiates for 3 days and came to the ER with painful crisis, missed hemodialysis and hyperkalemia.  Admitted to the hospital, treated with Dilaudid infusion and undergoing hemodialysis.   Assessment & Plan:   Sickle cell pain crisis: Still with significant pain.  Followed by sickle cell management team.  Currently remains on Dilaudid PCA with 3 mg/h of Dilaudid infusion.  Continue today as patient with significant pain.  Anticipate changing to oral opiates by tomorrow.  Continue hydroxyurea.  ESRD on hemodialysis/hyperkalemia: Corrected with hemodialysis 6/7.  Undergoing hemodialysis today to be back on his schedule.  Intradialytic hypotension and tachycardia corrected with fluid preservation.  Paroxysmal A-fib with RVR: Aggravated by pain and intradialytic hypotension.  Patient is not on any rate control medications due to chronic hypotension.  We will treat with as needed beta-blockers.  Treat for pain and volume. Patient is not on any anticoagulation.     DVT prophylaxis: heparin injection 5,000 Units Start: 11/22/21 0600   Code Status: Full code Family Communication: None Disposition Plan: Status is: Observation The patient will require care spanning > 2 midnights and should be moved to inpatient because: Needing injectable narcotics for pain control.     Consultants:  Nephrology  Procedures:  None  Antimicrobials:  None   Subjective: Patient seen and examined.  In early morning rounding, he complained of still having significant pain all over the body.  No bowel movement for the last 3 days.  Discussed about going  back on his home medication regimen with oral opiates, however patient stated very painful condition and needing to stay on injectables. Patient developed RVR without any symptoms while getting hemodialysis, nephrology adjusting to less fluid removal.  Objective: Vitals:   11/23/21 0928 11/23/21 0955 11/23/21 1013 11/23/21 1029  BP: 91/69 (!) 81/70  96/66  Pulse: (!) 122 73 82 (!) 155  Resp: (!) 7 12 12  (!) 23  Temp:      TempSrc:      SpO2: 96% 99% 99% 96%  Weight:      Height:        Intake/Output Summary (Last 24 hours) at 11/23/2021 1129 Last data filed at 11/22/2021 2351 Gross per 24 hour  Intake 326.5 ml  Output 8000 ml  Net -7673.5 ml   Filed Weights   11/22/21 0831 11/22/21 1323 11/23/21 0927  Weight: 64 kg 60 kg 61.5 kg    Examination:  General exam: Appeared anxious but looked comfortable on 1 to 2 L of oxygen. Respiratory system: Clear to auscultation. Respiratory effort normal. Cardiovascular system: S1 & S2 heard, irregularly irregular.  Tachycardic.  No edema. Gastrointestinal system: Abdomen is nondistended, soft and nontender. No organomegaly or masses felt. Normal bowel sounds heard. Central nervous system: Alert and oriented. No focal neurological deficits. Extremities: Symmetric 5 x 5 power. Skin: No rashes, lesions or ulcers Psychiatry: Judgement and insight appear normal.  Anxious.    Data Reviewed: I have personally reviewed following labs and imaging studies  CBC: Recent Labs  Lab 11/21/21 0649 11/21/21 0850 11/21/21 2347 11/22/21 0350 11/23/21 0530  WBC 6.5  --  7.3 8.5 10.9*  NEUTROABS 4.2  --  5.4  6.6  --   HGB 8.3* 10.2* 9.0* 7.9* 8.0*  HCT 22.4* 30.0* 25.2* 21.9* 21.4*  MCV 102.3*  --  103.7* 102.8* 100.5*  PLT 192  --  192 174 062*   Basic Metabolic Panel: Recent Labs  Lab 11/21/21 0850 11/21/21 2347 11/22/21 0350 11/22/21 0655 11/22/21 1453 11/22/21 2346 11/23/21 0047 11/23/21 0530 11/23/21 0940  NA 130* 132* 130*  --    --   --   --   --  134*  K 5.3* 6.0* 6.6*   < > 3.9 4.4 5.9* 4.7 4.0  CL 96* 92* 92*  --   --   --   --   --  95*  CO2  --  21* 20*  --   --   --   --   --  25  GLUCOSE 104* 106* 97  --   --   --   --   --  93  BUN 59* 71* 74*  --   --   --   --   --  42*  CREATININE 7.50* 7.19* 7.23*  --   --   --   --   --  4.35*  CALCIUM  --  8.8* 8.6*  --   --   --   --   --  8.4*  MG  --   --  2.1  --   --   --   --   --   --   PHOS  --   --  6.8*  --   --   --   --   --  5.6*   < > = values in this interval not displayed.   GFR: Estimated Creatinine Clearance: 15.1 mL/min (A) (by C-G formula based on SCr of 4.35 mg/dL (H)). Liver Function Tests: Recent Labs  Lab 11/21/21 2347 11/22/21 0350 11/23/21 0940  AST 42*  --   --   ALT 29  --   --   ALKPHOS 194*  --   --   BILITOT 1.4*  --   --   PROT 8.4*  --   --   ALBUMIN 3.4* 3.1* 3.1*   No results for input(s): "LIPASE", "AMYLASE" in the last 168 hours. No results for input(s): "AMMONIA" in the last 168 hours. Coagulation Profile: No results for input(s): "INR", "PROTIME" in the last 168 hours. Cardiac Enzymes: No results for input(s): "CKTOTAL", "CKMB", "CKMBINDEX", "TROPONINI" in the last 168 hours. BNP (last 3 results) No results for input(s): "PROBNP" in the last 8760 hours. HbA1C: No results for input(s): "HGBA1C" in the last 72 hours. CBG: Recent Labs  Lab 11/22/21 0638  GLUCAP 109*   Lipid Profile: No results for input(s): "CHOL", "HDL", "LDLCALC", "TRIG", "CHOLHDL", "LDLDIRECT" in the last 72 hours. Thyroid Function Tests: No results for input(s): "TSH", "T4TOTAL", "FREET4", "T3FREE", "THYROIDAB" in the last 72 hours. Anemia Panel: Recent Labs    11/21/21 0649 11/21/21 2347  RETICCTPCT 4.8* 4.2*   Sepsis Labs: No results for input(s): "PROCALCITON", "LATICACIDVEN" in the last 168 hours.  No results found for this or any previous visit (from the past 240 hour(s)).       Radiology Studies: DG Chest Portable 1  View  Result Date: 11/22/2021 CLINICAL DATA:  Fluid overload. EXAM: PORTABLE CHEST 1 VIEW COMPARISON:  November 21, 2021 (6:48 a.m.) FINDINGS: The cardiac silhouette is moderately enlarged and unchanged in size. There is marked severity calcification of the aortic arch. Diffuse, chronic appearing increased  interstitial lung markings are seen. There is no evidence of focal consolidation, pleural effusion or pneumothorax. The visualized skeletal structures are unremarkable. IMPRESSION: 1. Stable cardiomegaly without evidence of acute or active cardiopulmonary disease. Electronically Signed   By: Virgina Norfolk M.D.   On: 11/22/2021 00:10        Scheduled Meds:  allopurinol  100 mg Oral Daily   Chlorhexidine Gluconate Cloth  6 each Topical Q0600   [START ON 11/25/2021] cinacalcet  30 mg Oral Q T,Th,Sa-HD   doxercalciferol  4 mcg Intravenous Q T,Th,Sa-HD   fluticasone  2 spray Each Nare Daily   folic acid  1 mg Oral Daily   heparin  5,000 Units Subcutaneous Q8H   HYDROmorphone   Intravenous Q4H   hydroxyurea  500 mg Oral Q breakfast   oxyCODONE       polyethylene glycol  17 g Oral Daily   senna-docusate  2 tablet Oral QHS   [START ON 11/27/2021] Vitamin D (Ergocalciferol)  50,000 Units Oral Q Mon   Continuous Infusions:  albumin human     anticoagulant sodium citrate       LOS: 0 days    Time spent: 35 minutes    Barb Merino, MD [facility] Pager [phone]

## 2021-11-23 NOTE — Progress Notes (Signed)
Dr patel at bedside. This rn notified md of pt in atrial fib hr 120s to 140s and that HD initiated with UF off. Will notify primary md per dr patel. Charge nurse notified, will receive tx orders from primary md per HD nursing protocol at the instruction of charge nurse. Called at 0950 to inform 48M nurse of pt afib rates 120s to 140s were noted prior to beginning HD tx, nurse spoke to primary md while conversing with this rn, 48M nurse reports that md informed afib noted by this rn prior to beginning HD tx. 48M nurse reports md contributes rhythm to HD tx and states no tx needed and nephrology can choose to end current HD session if needed. Dr Posey Pronto in dept and updated at 1000

## 2021-11-23 NOTE — Progress Notes (Signed)
Pt refused 10 pm and 6 am Heparin doses, MD updated. SRP, RN

## 2021-11-23 NOTE — Progress Notes (Signed)
   11/23/21 2213  Assess: MEWS Score  Temp 98.1 F (36.7 C)  BP 91/73  MAP (mmHg) 80  Pulse Rate (!) 156  Resp 18  Level of Consciousness Alert  SpO2 95 %  O2 Device Nasal Cannula  O2 Flow Rate (L/min) 2 L/min  Assess: MEWS Score  MEWS Temp 0  MEWS Systolic 1  MEWS Pulse 3  MEWS RR 0  MEWS LOC 0  MEWS Score 4  MEWS Score Color Red  Assess: if the MEWS score is Yellow or Red  Were vital signs taken at a resting state? Yes  Focused Assessment Change from prior assessment (see assessment flowsheet)  Does the patient meet 2 or more of the SIRS criteria? No  Treat  MEWS Interventions Administered prn meds/treatments  Pain Scale 0-10  Pain Score 9  Pain Type Chronic pain  Pain Location Generalized  Pain Descriptors / Indicators Constant  Pain Frequency Intermittent  Pain Onset On-going  Pain Intervention(s) PCA encouraged  Take Vital Signs  Increase Vital Sign Frequency  Red: Q 1hr X 4 then Q 4hr X 4, if remains red, continue Q 4hrs  Escalate  MEWS: Escalate Red: discuss with charge nurse/RN and provider, consider discussing with RRT  Notify: Charge Nurse/RN  Name of Charge Nurse/RN Notified jannelle  Date Charge Nurse/RN Notified 11/23/21  Time Charge Nurse/RN Notified 2215  Notify: Provider  Provider Name/Title dr. Alcario Drought  Date Provider Notified 11/23/21  Time Provider Notified 2215  Method of Notification Page  Notification Reason Critical result  Provider response No new orders  Date of Provider Response 11/23/21  Time of Provider Response 2220  Document  Patient Outcome Stabilized after interventions  Assess: SIRS CRITERIA  SIRS Temperature  0  SIRS Pulse 1  SIRS Respirations  0  SIRS WBC 0  SIRS Score Sum  1

## 2021-11-23 NOTE — Progress Notes (Signed)
Subjective: Patrick [name] is a 66 year old male with a medical history significant for sickle cell disease, chronic A-fib not on anticoagulation, chronic pain syndrome on opiates, ESRD on hemodialysis TTS schedule, history of anemia of chronic disease was admitted for sickle cell pain crisis.  Patient states that he just returned from hemodialysis and continues to be extremely fatigued.  Pain is primarily to low back and lower extremities, unrelieved by his Dilaudid PCA. He denies headache, dizziness, chest pain, shortness of breath, nausea, vomiting, or diarrhea.  Objective:  Vital signs in last 24 hours:  Vitals:   11/23/21 1400 11/23/21 1401 11/23/21 1631 11/23/21 1632  BP:  96/69 90/69   Pulse: (!) 118 (!) 110 (!) 128   Resp:   18 16  Temp:   98.2 F (36.8 C)   TempSrc:      SpO2:  96% 95% 98%  Weight:      Height:        Intake/Output from previous day:   Intake/Output Summary (Last 24 hours) at 11/23/2021 1711 Last data filed at 11/23/2021 1500 Gross per 24 hour  Intake 560 ml  Output 0 ml  Net 560 ml    Physical Exam: General: Alert, awake, oriented x3, in no acute distress.  HEENT: [place]/AT PEERL, EOMI Neck: Trachea midline,  no masses, no thyromegal,y no JVD, no carotid bruit OROPHARYNX:  Moist, No exudate/ erythema/lesions.  Heart: Regular rate and rhythm, without murmurs, rubs, gallops, PMI non-displaced, no heaves or thrills on palpation.  Lungs: Clear to auscultation, no wheezing or rhonchi noted. No increased vocal fremitus resonant to percussion  Abdomen: Soft, nontender, nondistended, positive bowel sounds, no masses no hepatosplenomegaly noted..  Neuro: No focal neurological deficits noted cranial nerves II through XII grossly intact. DTRs 2+ bilaterally upper and lower extremities. Strength 5 out of 5 in bilateral upper and lower extremities. Musculoskeletal: No warm swelling or erythema around joints, no spinal tenderness noted. Psychiatric: Patient alert  and oriented x3, good insight and cognition, good recent to remote recall. Lymph node survey: No cervical axillary or inguinal lymphadenopathy noted.  Lab Results:  Basic Metabolic Panel:    Component Value Date/Time   NA 134 (L) 11/23/2021 0940   NA 129 (L) 09/04/2021 1211   K 4.0 11/23/2021 0940   CL 95 (L) 11/23/2021 0940   CO2 25 11/23/2021 0940   BUN 42 (H) 11/23/2021 0940   BUN 34 (H) 09/04/2021 1211   CREATININE 4.35 (H) 11/23/2021 0940   CREATININE 2.90 (H) 08/11/2015 1359   GLUCOSE 93 11/23/2021 0940   CALCIUM 8.4 (L) 11/23/2021 0940   CBC:    Component Value Date/Time   WBC 10.9 (H) 11/23/2021 0530   HGB 8.0 (L) 11/23/2021 0530   HGB 8.2 (L) 09/28/2021 1036   HCT 21.4 (L) 11/23/2021 0530   HCT 24.6 (L) 09/28/2021 1036   PLT 142 (L) 11/23/2021 0530   PLT 182 09/28/2021 1036   MCV 100.5 (H) 11/23/2021 0530   MCV 103 (H) 09/28/2021 1036   NEUTROABS 6.6 11/22/2021 0350   NEUTROABS 3.4 09/04/2021 1211   LYMPHSABS 0.5 (L) 11/22/2021 0350   LYMPHSABS 0.8 09/04/2021 1211   MONOABS 1.0 11/22/2021 0350   EOSABS 0.2 11/22/2021 0350   EOSABS 0.0 09/04/2021 1211   BASOSABS 0.1 11/22/2021 0350   BASOSABS 0.1 09/04/2021 1211    No results found for this or any previous visit (from the past 240 hour(s)).  Studies/Results: DG Chest Portable 1 View  Result Date: 11/22/2021 CLINICAL DATA:  Fluid overload. EXAM: PORTABLE CHEST 1 VIEW COMPARISON:  November 21, 2021 (6:48 a.m.) FINDINGS: The cardiac silhouette is moderately enlarged and unchanged in size. There is marked severity calcification of the aortic arch. Diffuse, chronic appearing increased interstitial lung markings are seen. There is no evidence of focal consolidation, pleural effusion or pneumothorax. The visualized skeletal structures are unremarkable. IMPRESSION: 1. Stable cardiomegaly without evidence of acute or active cardiopulmonary disease. Electronically Signed   By: Virgina Norfolk M.D.   On: 11/22/2021 00:10     Medications: Scheduled Meds:  allopurinol  100 mg Oral Daily   Chlorhexidine Gluconate Cloth  6 each Topical Q0600   [START ON 11/25/2021] cinacalcet  30 mg Oral Q T,Th,Sa-HD   doxercalciferol  4 mcg Intravenous Q T,Th,Sa-HD   fluticasone  2 spray Each Nare Daily   folic acid  1 mg Oral Daily   heparin  5,000 Units Subcutaneous Q8H   HYDROmorphone   Intravenous Q4H   hydroxyurea  500 mg Oral Q breakfast   oxyCODONE       polyethylene glycol  17 g Oral Daily   senna-docusate  2 tablet Oral QHS   [START ON 11/27/2021] Vitamin D (Ergocalciferol)  50,000 Units Oral Q Mon   Continuous Infusions:  albumin human     PRN Meds:.acetaminophen, diphenhydrAMINE, metoprolol tartrate, naLOXone (NARCAN)  injection, naloxone **AND** sodium chloride flush, oxyCODONE, oxyCODONE, polyvinyl alcohol  Consultants: Nephrology/hemodialysis  Procedures: none  Antibiotics: none  Assessment/Plan: Principal Problem:   Sickle cell pain crisis ([place]) Active Problems:   Sickle cell anemia with crisis ([place])  Sickle cell disease with pain crisis: Continue IV Dilaudid PCA without any changes in settings Conservative IV fluids due to potential overload Avoid all nephrotoxins Add oxycodone 10 mg every 4 hours as needed for severe breakthrough pain, patient is opiate tolerant Monitor vital signs very closely, reevaluate pain scale regularly, and supplemental oxygen as needed  Anemia of chronic disease: Hemoglobin is stable.  No transfusion warranted at this time.  Continue to monitor closely.  Continue folic acid and hydroxyurea  ESRD on hemodialysis/hyperkalemia: Patient is followed by nephrology, continues hemodialysis.  The goal is to get patient back on his typical schedule  Paroxysmal A-fib with RVR: Patient tachycardic.  He is not on any rate control medications at this time.  We will continue as needed beta-blockers.  Patient is not on any anticoagulation at this time.  Code Status: Full  Code Family Communication: N/A Disposition Plan: Not yet ready for discharge  [place], MSN, FNP-C Patient [facility] [address] [place], [place] [address] [phone]  If 7PM-7AM, please contact night-coverage.  11/23/2021, 5:11 PM  LOS: 0 days

## 2021-11-23 NOTE — Care Management Obs Status (Signed)
[place] NOTIFICATION   Patient Details  Name: Patrick [name] MRN: [id number] Date of Birth: May 25, 1956   Medicare Observation Status Notification Given:  Yes    Verdell Carmine, RN 11/23/2021, 8:41 AM

## 2021-11-23 NOTE — Progress Notes (Addendum)
Primary HD nurse states pt given 241ml bolus of Normal saline by per md d/t elevated heart rate and low b/p.

## 2021-11-23 NOTE — Progress Notes (Signed)
Subjective: Patrick [name] is a 66 year old male with a medical history significant for sickle cell disease, chronic A-fib not on anticoagulation, chronic pain syndrome on opiates, ESRD on hemodialysis TTS schedule, history of anemia of chronic disease was admitted for sickle cell pain crisis.  Patient states that he just returned from hemodialysis and is extremely fatigued.  He states that he is hurting "all over" and pain has been mostly unrelieved by his Dilaudid PCA. He denies headache, dizziness, chest pain, shortness of breath, nausea, vomiting, or diarrhea.  Objective:  Vital signs in last 24 hours:  Vitals:   11/23/21 1330 11/23/21 1350 11/23/21 1400 11/23/21 1401  BP: (!) 89/68 (!) 88/66  96/69  Pulse: (!) 157 (!) 144 (!) 118 (!) 110  Resp:      Temp: 98.7 F (37.1 C) 98.6 F (37 C)    TempSrc:  Oral    SpO2: 93% 93%  96%  Weight:      Height:        Intake/Output from previous day:   Intake/Output Summary (Last 24 hours) at 11/23/2021 1530 Last data filed at 11/22/2021 2351 Gross per 24 hour  Intake 326.5 ml  Output 0 ml  Net 326.5 ml    Physical Exam: General: Alert, awake, oriented x3, in no acute distress.  HEENT: [place]/AT PEERL, EOMI Neck: Trachea midline,  no masses, no thyromegal,y no JVD, no carotid bruit OROPHARYNX:  Moist, No exudate/ erythema/lesions.  Heart: Regular rate and rhythm, without murmurs, rubs, gallops, PMI non-displaced, no heaves or thrills on palpation.  Lungs: Clear to auscultation, no wheezing or rhonchi noted. No increased vocal fremitus resonant to percussion  Abdomen: Soft, nontender, nondistended, positive bowel sounds, no masses no hepatosplenomegaly noted..  Neuro: No focal neurological deficits noted cranial nerves II through XII grossly intact. DTRs 2+ bilaterally upper and lower extremities. Strength 5 out of 5 in bilateral upper and lower extremities. Musculoskeletal: No warm swelling or erythema around joints, no spinal  tenderness noted. Psychiatric: Patient alert and oriented x3, good insight and cognition, good recent to remote recall. Lymph node survey: No cervical axillary or inguinal lymphadenopathy noted.  Lab Results:  Basic Metabolic Panel:    Component Value Date/Time   NA 134 (L) 11/23/2021 0940   NA 129 (L) 09/04/2021 1211   K 4.0 11/23/2021 0940   CL 95 (L) 11/23/2021 0940   CO2 25 11/23/2021 0940   BUN 42 (H) 11/23/2021 0940   BUN 34 (H) 09/04/2021 1211   CREATININE 4.35 (H) 11/23/2021 0940   CREATININE 2.90 (H) 08/11/2015 1359   GLUCOSE 93 11/23/2021 0940   CALCIUM 8.4 (L) 11/23/2021 0940   CBC:    Component Value Date/Time   WBC 10.9 (H) 11/23/2021 0530   HGB 8.0 (L) 11/23/2021 0530   HGB 8.2 (L) 09/28/2021 1036   HCT 21.4 (L) 11/23/2021 0530   HCT 24.6 (L) 09/28/2021 1036   PLT 142 (L) 11/23/2021 0530   PLT 182 09/28/2021 1036   MCV 100.5 (H) 11/23/2021 0530   MCV 103 (H) 09/28/2021 1036   NEUTROABS 6.6 11/22/2021 0350   NEUTROABS 3.4 09/04/2021 1211   LYMPHSABS 0.5 (L) 11/22/2021 0350   LYMPHSABS 0.8 09/04/2021 1211   MONOABS 1.0 11/22/2021 0350   EOSABS 0.2 11/22/2021 0350   EOSABS 0.0 09/04/2021 1211   BASOSABS 0.1 11/22/2021 0350   BASOSABS 0.1 09/04/2021 1211    No results found for this or any previous visit (from the past 240 hour(s)).  Studies/Results: DG Chest Portable  1 View  Result Date: 11/22/2021 CLINICAL DATA:  Fluid overload. EXAM: PORTABLE CHEST 1 VIEW COMPARISON:  November 21, 2021 (6:48 a.m.) FINDINGS: The cardiac silhouette is moderately enlarged and unchanged in size. There is marked severity calcification of the aortic arch. Diffuse, chronic appearing increased interstitial lung markings are seen. There is no evidence of focal consolidation, pleural effusion or pneumothorax. The visualized skeletal structures are unremarkable. IMPRESSION: 1. Stable cardiomegaly without evidence of acute or active cardiopulmonary disease. Electronically Signed   By:  Virgina Norfolk M.D.   On: 11/22/2021 00:10    Medications: Scheduled Meds:  allopurinol  100 mg Oral Daily   Chlorhexidine Gluconate Cloth  6 each Topical Q0600   [START ON 11/25/2021] cinacalcet  30 mg Oral Q T,Th,Sa-HD   doxercalciferol  4 mcg Intravenous Q T,Th,Sa-HD   fluticasone  2 spray Each Nare Daily   folic acid  1 mg Oral Daily   heparin  5,000 Units Subcutaneous Q8H   HYDROmorphone   Intravenous Q4H   hydroxyurea  500 mg Oral Q breakfast   oxyCODONE       polyethylene glycol  17 g Oral Daily   senna-docusate  2 tablet Oral QHS   [START ON 11/27/2021] Vitamin D (Ergocalciferol)  50,000 Units Oral Q Mon   Continuous Infusions:  albumin human     PRN Meds:.acetaminophen, diphenhydrAMINE, metoprolol tartrate, naLOXone (NARCAN)  injection, naloxone **AND** sodium chloride flush, oxyCODONE, oxyCODONE, polyvinyl alcohol  Consultants: Nephrology/hemodialysis  Procedures: none  Antibiotics: none  Assessment/Plan: Principal Problem:   Sickle cell pain crisis ([place]) Active Problems:   Sickle cell anemia with crisis ([place])  Sickle cell disease with pain crisis: Continue IV Dilaudid PCA without any changes in settings Conservative IV fluids due to potential overload Avoid all nephrotoxins Add oxycodone 10 mg every 4 hours as needed for severe breakthrough pain, patient is opiate tolerant Monitor vital signs very closely, reevaluate pain scale regularly, and supplemental oxygen as needed  Anemia of chronic disease: Hemoglobin is stable.  No transfusion warranted at this time.  Continue to monitor closely.  Continue folic acid and hydroxyurea  ESRD on hemodialysis/hyperkalemia: Patient is followed by nephrology, continues hemodialysis.  The goal is to get patient back on his typical schedule  Paroxysmal A-fib with RVR: Patient tachycardic.  He is not on any rate control medications at this time.  We will continue as needed beta-blockers.  Patient is not on any  anticoagulation at this time.  Code Status: Full Code Family Communication: N/A Disposition Plan: Not yet ready for discharge  [place], MSN, FNP-C Patient [facility] [address] [place], [place] [address] [phone]  If 7PM-7AM, please contact night-coverage.  11/23/2021, 3:30 PM  LOS: 0 days

## 2021-11-23 NOTE — Procedures (Signed)
Patient seen on Hemodialysis. BP 91/69   Pulse (!) 122   Temp 98 F (36.7 C) (Oral)   Resp (!) 7   Ht 5\' 11"  (1.803 m)   Wt 61.5 kg   SpO2 96%   BMI 18.91 kg/m   QB 300, UF goal (turned off) Currently in atrial fibrillation with RVR- UF turned off, BFR dropped to 300 and giving 25gm albumin bolus.   Elmarie Shiley MD [facility]. Office # [phone] Pager # [phone] 9:41 AM

## 2021-11-23 NOTE — Progress Notes (Signed)
New Dilaudid syringe place in PCA, wasted 5ML with Doralee [name], RN.

## 2021-11-23 NOTE — Progress Notes (Signed)
This rn notes pt arrived to unit alert, oxygen sat 88 RA, PCA pump with dilaudid in pause status, pt in atrial fib 120s, pt currently rates chronic pain 10/10. Pt placed on oxygen 2 liter [place].  Called 39M nurse, nurse states PCA pump in active status upon leaving the floor, nurse states pt has been atrial fib hr 120s. This rn restarted PCA pump. 39M nurse states will update primary md of pt current pain status.

## 2021-11-23 NOTE — Progress Notes (Signed)
   11/23/21 1350  Assess: MEWS Score  Temp 98.6 F (37 C)  BP (!) 88/66  MAP (mmHg) 72  Pulse Rate (!) 144  Level of Consciousness Alert  SpO2 93 %  O2 Device Nasal Cannula  O2 Flow Rate (L/min) 2 L/min  Assess: MEWS Score  MEWS Temp 0  MEWS Systolic 1  MEWS Pulse 3  MEWS RR 0  MEWS LOC 0  MEWS Score 4  MEWS Score Color Red  Assess: if the MEWS score is Yellow or Red  Were vital signs taken at a resting state? Yes  Focused Assessment No change from prior assessment  Does the patient meet 2 or more of the SIRS criteria? No  Notify: Provider  Provider Name/Title Ghimire  Date Provider Notified 11/23/21  Time Provider Notified 1338  Method of Notification Face-to-face  Notification Reason Critical result  Provider response See new orders (metoprolol)  Date of Provider Response 11/23/21  Time of Provider Response 1338  Assess: SIRS CRITERIA  SIRS Temperature  0  SIRS Pulse 1  SIRS Respirations  0  SIRS WBC 0  SIRS Score Sum  1    PT give 5mg  Metoprolol. HR has come down. Continuing to monitor.

## 2021-11-23 NOTE — Progress Notes (Signed)
Received call from HD. PT HR uncontrolled. Primary MD made aware and stated he can treat HR if necessary with pt is back on the unit.

## 2021-11-23 NOTE — Progress Notes (Addendum)
Patient ID: Patrick [name], male   DOB: 1955-07-04, 66 y.o.   MRN: [id number] [facility] Progress Note   Assessment/ Plan:   Hyperkalemia: secondary to missed HD and sickle cell crisis- improved s/p HD yesterday ESRD -HD normally on a TTS schedule and underwent HD yesterday because of hyperkalemia. On HD at this time to continue OP schedule.  Sickle cell crisis= plan per admit team, currently using IV Dilaudid PCA Hypertension/volume  -BP lower on dialysis likely exacerbated by atrial fibrillation with RVR- changes made.  Hyponatremia= Likely from missed dialysis with mild volume up , monitor trend with HD/UF Anemia of ESRD also sickle cell= last given max Mircera 6/01 follow-up trend Metabolic bone disease - Calcium and phosphorus noted, continue binders for elevated phosphorus  Atrial fibrillation= no anticoagulation or rate control medications, now with RVR for which I am making changes to dialysis prescription.   Subjective:   Still having pain from sickle cell crisis and today having A fib with RVR   Objective:   BP 91/69   Pulse (!) 122   Temp 98 F (36.7 C) (Oral)   Resp (!) 7   Ht 5\' 11"  (1.803 m)   Wt 61.5 kg   SpO2 96%   BMI 18.91 kg/m   Physical Exam: YCX:[id number] on dialysis YOV:ZCHYIFOYD tachycardia, s1 and s2 with ESM Resp:Clear to ausculation bilaterally, no rales XAJ:OINO, flat, non-tender Ext:No LE edema, RUA AVF cannulated at HD  Labs: BMET Recent Labs  Lab 11/21/21 0850 11/21/21 2347 11/22/21 0350 11/22/21 0655 11/22/21 1453 11/22/21 2346 11/23/21 0047 11/23/21 0530  NA 130* 132* 130*  --   --   --   --   --   K 5.3* 6.0* 6.6* 5.9* 3.9 4.4 5.9* 4.7  CL 96* 92* 92*  --   --   --   --   --   CO2  --  21* 20*  --   --   --   --   --   GLUCOSE 104* 106* 97  --   --   --   --   --   BUN 59* 71* 74*  --   --   --   --   --   CREATININE 7.50* 7.19* 7.23*  --   --   --   --   --   CALCIUM  --  8.8* 8.6*  --   --   --   --   --    PHOS  --   --  6.8*  --   --   --   --   --    CBC Recent Labs  Lab 11/21/21 0649 11/21/21 0850 11/21/21 2347 11/22/21 0350  WBC 6.5  --  7.3 8.5  NEUTROABS 4.2  --  5.4 6.6  HGB 8.3* 10.2* 9.0* 7.9*  HCT 22.4* 30.0* 25.2* 21.9*  MCV 102.3*  --  103.7* 102.8*  PLT 192  --  192 174      Medications:     allopurinol  100 mg Oral Daily   Chlorhexidine Gluconate Cloth  6 each Topical Q0600   [START ON 11/25/2021] cinacalcet  30 mg Oral Q T,Th,Sa-HD   doxercalciferol  4 mcg Intravenous Q T,Th,Sa-HD   fluticasone  2 spray Each Nare Daily   folic acid  1 mg Oral Daily   heparin  5,000 Units Subcutaneous Q8H   HYDROmorphone   Intravenous Q4H   hydroxyurea  500 mg Oral Q breakfast  oxyCODONE  10 mg Oral Once   polyethylene glycol  17 g Oral Daily   senna-docusate  2 tablet Oral QHS   [START ON 11/27/2021] Vitamin D (Ergocalciferol)  50,000 Units Oral Q Mon   Elmarie Shiley, MD 11/23/2021, 9:42 AM

## 2021-11-24 DIAGNOSIS — D5700 Hb-SS disease with crisis, unspecified: Secondary | ICD-10-CM | POA: Diagnosis not present

## 2021-11-24 LAB — CBC WITH DIFFERENTIAL/PLATELET
Abs Immature Granulocytes: 0.09 10*3/uL — ABNORMAL HIGH (ref 0.00–0.07)
Basophils Absolute: 0.1 10*3/uL (ref 0.0–0.1)
Basophils Relative: 1 %
Eosinophils Absolute: 0.1 10*3/uL (ref 0.0–0.5)
Eosinophils Relative: 2 %
Hemoglobin: 8.1 g/dL — ABNORMAL LOW (ref 13.0–17.0)
Immature Granulocytes: 1 %
Lymphocytes Relative: 6 %
Lymphs Abs: 0.6 10*3/uL — ABNORMAL LOW (ref 0.7–4.0)
Monocytes Absolute: 0.9 10*3/uL (ref 0.1–1.0)
Monocytes Relative: 10 %
Neutro Abs: 7.2 10*3/uL (ref 1.7–7.7)
Neutrophils Relative %: 80 %
Platelets: 124 10*3/uL — ABNORMAL LOW (ref 150–400)
WBC: 9 10*3/uL (ref 4.0–10.5)
nRBC: 15.9 % — ABNORMAL HIGH (ref 0.0–0.2)

## 2021-11-24 LAB — COMPREHENSIVE METABOLIC PANEL
ALT: 42 U/L (ref 0–44)
AST: 54 U/L — ABNORMAL HIGH (ref 15–41)
Albumin: 3.2 g/dL — ABNORMAL LOW (ref 3.5–5.0)
Alkaline Phosphatase: 339 U/L — ABNORMAL HIGH (ref 38–126)
Anion gap: 13 (ref 5–15)
BUN: 43 mg/dL — ABNORMAL HIGH (ref 8–23)
CO2: 26 mmol/L (ref 22–32)
Calcium: 8.7 mg/dL — ABNORMAL LOW (ref 8.9–10.3)
Chloride: 93 mmol/L — ABNORMAL LOW (ref 98–111)
Creatinine, Ser: 4.45 mg/dL — ABNORMAL HIGH (ref 0.61–1.24)
GFR, Estimated: 14 mL/min — ABNORMAL LOW (ref >60)
Glucose, Bld: 116 mg/dL — ABNORMAL HIGH (ref 70–99)
Potassium: 4 mmol/L (ref 3.5–5.1)
Sodium: 132 mmol/L — ABNORMAL LOW (ref 135–145)
Total Bilirubin: 2.5 mg/dL — ABNORMAL HIGH (ref 0.3–1.2)
Total Protein: 8 g/dL (ref 6.5–8.1)

## 2021-11-24 LAB — RETICULOCYTES
Immature Retic Fract: 33 % — ABNORMAL HIGH (ref 2.3–15.9)
RBC.: 2.13 MIL/uL — ABNORMAL LOW (ref 4.22–5.81)
Retic Count, Absolute: 123.8 10*3/uL (ref 19.0–186.0)
Retic Ct Pct: 5.8 % — ABNORMAL HIGH (ref 0.4–3.1)

## 2021-11-24 LAB — LACTATE DEHYDROGENASE: LDH: 616 U/L — ABNORMAL HIGH (ref 98–192)

## 2021-11-24 MED ORDER — SODIUM CHLORIDE 0.9 % IV BOLUS: 500.0000 mL | Freq: Once | INTRAVENOUS | Status: DC

## 2021-11-24 MED ORDER — SODIUM CHLORIDE 0.9 % IV BOLUS
500.0000 mL | Freq: Once | INTRAVENOUS | Status: AC
  Administered 2021-11-24: 500 mL via INTRAVENOUS

## 2021-11-24 MED ORDER — OXYCODONE HCL 10 MG PO TABS: 10.0000 mg | ORAL_TABLET | Freq: Four times a day (QID) | ORAL | 0 refills | Status: DC | PRN

## 2021-11-24 MED ORDER — RENA-VITE PO TABS: 1.0000 | ORAL_TABLET | Freq: Every day | ORAL | Status: DC

## 2021-11-24 MED ORDER — TIZANIDINE HCL 4 MG PO TABS: 2.0000 mg | ORAL_TABLET | Freq: Three times a day (TID) | ORAL | 1 refills | Status: DC | PRN

## 2021-11-24 NOTE — Progress Notes (Signed)
Patient ID: Patrick [name], male   DOB: [birth year], 66 y.o.   MRN: [id number] [facility] Progress Note   Assessment/ Plan:   Hyperkalemia: secondary to missed HD and sickle cell crisis.  This has been corrected with hemodialysis ESRD -HD normally on a TTS schedule and he underwent hemodialysis yesterday per his outpatient schedule with next treatment scheduled for tomorrow.  Sickle cell crisis= ongoing optimization of pain control and anticipate transition from intravenous Dilaudid to oral agents today Hypertension/volume : With intermittently low blood pressures, will continue to monitor cautiously with hemodialysis/ultrafiltration. Hyponatremia= Likely from missed dialysis with mild volume up , improving with ongoing hemodialysis/ultrafiltration Anemia of ESRD/sickle cell: Without overt blood loss and will continue ESA with hemodialysis Metabolic bone disease - Calcium and phosphorus noted, continue binders for elevated phosphorus  Atrial fibrillation= no anticoagulation or rate control medications, yesterday had RVR during hemodialysis that we managed by giving fluid boluses and decreasing ultrafiltration goal.   Subjective:   Reports that he continues to have pain over his lower back and legs albeit improving.   Objective:   BP 94/77 (BP Location: Left Arm)   Pulse (!) 109   Temp 98.2 F (36.8 C)   Resp 18   Ht 5\' 11"  (1.803 m)   Wt 61.9 kg   SpO2 90%   BMI 19.03 kg/m   Physical Exam: Gen: Sitting up on the side of his bed waiting assistance to get into recliner JOA:[id number] tachycardia, s1 and s2 with ESM Resp:Clear to ausculation bilaterally, no rales SWF:UXNA, flat, non-tender Ext:No LE edema, RUA AVF with palpable thrill  Labs: BMET Recent Labs  Lab 11/21/21 0850 11/21/21 2347 11/22/21 0350 11/22/21 0655 11/22/21 1453 11/22/21 2346 11/23/21 0047 11/23/21 0530 11/23/21 0940  NA 130* 132* 130*  --   --   --   --   --  134*  K 5.3* 6.0* 6.6*  5.9* 3.9 4.4 5.9* 4.7 4.0  CL 96* 92* 92*  --   --   --   --   --  95*  CO2  --  21* 20*  --   --   --   --   --  25  GLUCOSE 104* 106* 97  --   --   --   --   --  93  BUN 59* 71* 74*  --   --   --   --   --  42*  CREATININE 7.50* 7.19* 7.23*  --   --   --   --   --  4.35*  CALCIUM  --  8.8* 8.6*  --   --   --   --   --  8.4*  PHOS  --   --  6.8*  --   --   --   --   --  5.6*   CBC Recent Labs  Lab 11/21/21 0649 11/21/21 0850 11/21/21 2347 11/22/21 0350 11/23/21 0530  WBC 6.5  --  7.3 8.5 10.9*  NEUTROABS 4.2  --  5.4 6.6  --   HGB 8.3* 10.2* 9.0* 7.9* 8.0*  HCT 22.4* 30.0* 25.2* 21.9* 21.4*  MCV 102.3*  --  103.7* 102.8* 100.5*  PLT 192  --  192 174 142*      Medications:     allopurinol  100 mg Oral Daily   Chlorhexidine Gluconate Cloth  6 each Topical Q0600   [START ON 11/25/2021] cinacalcet  30 mg Oral Q T,Th,Sa-HD   doxercalciferol  4  mcg Intravenous Q T,Th,Sa-HD   feeding supplement  237 mL Oral BID BM   fluticasone  2 spray Each Nare Daily   folic acid  1 mg Oral Daily   heparin  5,000 Units Subcutaneous Q8H   HYDROmorphone   Intravenous Q4H   hydroxyurea  500 mg Oral Q breakfast   polyethylene glycol  17 g Oral Daily   senna-docusate  2 tablet Oral QHS   [START ON 11/27/2021] Vitamin D (Ergocalciferol)  50,000 Units Oral Q Mon   Elmarie Shiley, MD 11/24/2021, 10:15 AM

## 2021-11-24 NOTE — Progress Notes (Signed)
Patient discharged.  Remaining 2ml of Dilaudid from PCA syringe wasted in stericylce.  Witnessed by [employer]

## 2021-11-24 NOTE — Progress Notes (Signed)
Initial Nutrition Assessment  DOCUMENTATION CODES:   Severe malnutrition in context of chronic illness  INTERVENTION:  Ensure Enlive po BID, each supplement provides 350 kcal and 20 grams of protein. Renal MVI with minerals daily Liberalize diet from a renal diet to a 2g sodium diet to provide widest variety of menu options to enhance nutritional adequacy  NUTRITION DIAGNOSIS:   Severe Malnutrition related to chronic illness (sickle cell, ESRD on HD) as evidenced by severe fat depletion, severe muscle depletion, moderate muscle depletion.  GOAL:   Patient will meet greater than or equal to 90% of their needs  MONITOR:   PO intake, Supplement acceptance, Diet advancement, Labs, Weight trends, I & O's  REASON FOR ASSESSMENT:   Consult Assessment of nutrition requirement/status  ASSESSMENT:   Pt admitted from home with sickle cell pain crisis. PMH significant for sickle cell anemia, chronic pain syndrome on chronic opioid use, ESRD on HD TTS, former smoker, permanent afib, pulmonary HTN, nonischemic cardiomyopathy.  Pt noted to develop RVR during HD on 6/8. Nephrology adjusting to less fluid removal.  Pt states that he has had a poor appetite and has only been able to eat a few bites of foods within the past 2 days d/t sickle cell crisis. Observed lunch tray with grilled chicken salad, of which pt has not eaten. He states that he typically eats 3 meals per day- consistent between HD versus non-HD days. His dietary recall includes eggs and bacon for breakfast, frozen pot pie for lunch and frozen pot pie for dinner. He lives alone and prepares his own meals. Noted in chart review that he has cousins that assist with transportation support as needed. Pt receives protein bars at dialysis. He is followed by a Dietitian at dialysis and that report he has been doing well.   Meal completions: 06/07: 50%-dinner, 06/08: 25%-lunch, 0%-dinner 06/09: 161%-[id number]  Pt is uncertain of his  EDW but states that it has been going up, however reports wt loss within the past 2 days which is likely attributed to volume status. Documented post-HD wt on 6/8- 61.9 kg. Pt came in volume overloaded d/t missing HD session. Per Nephrology notes, pt EDW- 57 kg.   Briefly discussed importance of adequate nutrition with emphasis on protein intake, as well as continuing to limit salt and added phosphorus containing foods.   Medications: cinacalcet, doxercalciferol, folvite, miralax, senna, Vitamin D  Labs: sodium 134, potassium WNL, Cr 4.35, ionized Ca 1.03, Phos 5.6 (H),  CBG 109  UOP: 250 ml x12 hours  I/O's: -6781ml since admission  06/07 HD Net UF: 4025ml 06/08: no Net UF d/t RVR  NUTRITION - FOCUSED PHYSICAL EXAM:  Flowsheet Row Most Recent Value  Orbital Region Mild depletion  Upper Arm Region Severe depletion  Thoracic and Lumbar Region Severe depletion  Buccal Region No depletion  Temple Region Mild depletion  Clavicle Bone Region Mild depletion  Clavicle and Acromion Bone Region Moderate depletion  Scapular Bone Region Moderate depletion  Dorsal Hand Severe depletion  Patellar Region Severe depletion  Anterior Thigh Region Severe depletion  Posterior Calf Region Moderate depletion  Edema (RD Assessment) None  Hair Reviewed  Eyes Reviewed  [pt legally blind]  Mouth Reviewed  Skin Reviewed  Nails Reviewed      Diet Order:   Diet Order             Diet renal with fluid restriction Fluid restriction: 1200 mL Fluid; Room service appropriate? Yes; Fluid consistency: Thin  Diet effective now  EDUCATION NEEDS:   Education needs have been addressed  Skin:  Skin Assessment: Reviewed RN Assessment  Last BM:  6/5  Height:   Ht Readings from Last 1 Encounters:  11/22/21 5\' 11"  (1.803 m)    Weight:   Wt Readings from Last 1 Encounters:  11/23/21 61.9 kg   BMI:  Body mass index is 19.03 kg/m.  Estimated Nutritional Needs:   Kcal:   2000-2200  Protein:  85-100g  Fluid:  1L + UOP  Clayborne Dana, RDN, LDN Clinical Nutrition

## 2021-11-24 NOTE — Discharge Summary (Signed)
Physician Discharge Summary  [name] HTD:[id number] DOB: 1956/02/11 DOA: 11/21/2021  PCP: [name], MD  Admit date: 11/21/2021  Discharge date: 11/24/2021  Discharge Diagnoses:  Principal Problem:   Sickle cell pain crisis ([place]) Active Problems:   Sickle cell anemia with crisis [facility])   Discharge Condition: Stable  Disposition:   Follow-up Information     [name], MD Follow up.   Specialty: Internal Medicine Contact information: [place] [address] [phone]         Bensimhon, Shaune Pascal, MD Follow up in 1 week(s).   Specialty: Cardiology Why: If symptoms worsen Contact information: [address] [place] [address] [phone]                Pt is discharged home in good condition and is to follow up with [name], MD this week to have labs evaluated. Patrick [name] is instructed to increase activity slowly and balance with rest for the next few days, and use prescribed medication to complete treatment of pain  Diet: Regular Wt Readings from Last 3 Encounters:  11/23/21 61.9 kg  11/21/21 60.8 kg  09/28/21 57.6 kg    History of present illness:  Patrick [name] is a 66 year old male with a medical history significant for sickle cell anemia, chronic pain syndrome on chronic opiate use, ESRD on HD TTS, former smoker, permanent A-fib not anticoagulated, pulmonary hypertension, nonischemic cardiomyopathy who presented to [facility] ED from home with complaints of sickle cell pain crisis x1 day.  He states that he ran out of his opiate medications a few days ago. This patient was seen initially in the ED earlier this morning for the same problem.  He was given home dose of pain medications without relief, but had to leave for dialysis.  Unfortunately, he was able to get a ride to his dialysis.  He returned to the emergency department with complaints of hurting all over.  Lab studies  revealed serum potassium of 6.0, hemoglobin 9.0.  The patient missed his hemodialysis session.  EDP discussed with nephrology who will arrange for hemodialysis in the hospital on 11/22/2021 prior to discharge.  The patient was admitted by [facility], hospitalist service for sickle cell pain crisis.  ER course: Tmax 98.7 F, BP 119/88, pulse 84, respirations 19, saturation 99% on room air.  Lab studies remarkable for WBC 7.3, hemoglobin 9.0, MCV 103, platelet count 193.  Sodium 132, potassium 6.0, bicarb 21, creatinine 7.19, BUN 71, alkaline phos 194, AST 92, and total bilirubin 1.4.  Hospital Course:  Sickle cell disease with pain crisis: Patient was admitted for sickle cell pain crisis.  He was managed appropriately with IV Dilaudid PCA as well as other adjunct therapies per sickle cell pain management protocols.  All nephrotoxins were avoided due to ESRD.  Patient's pain intensity improved throughout admission and he was transition to his home medications.  Home medication is oxycodone 10 mg every 6 hours as needed for severe pain.  Patient states that he does not have pain medication at home, but feels that he can manage at home at this time.  Oxycodone 10 mg #60 was sent to patient's pharmacy.  PDMP substance control system was reviewed prior to prescribing opiate medications and no inconsistencies were noted.  Patient will follow-up with PCP on 11/28/2021 and will repeat labs at that time.  ESRD on TTS/hyperkalemia in the setting of missed HD: Patient resumed hemodialysis per nephrology team.  Patient's volume status and electrolytes were managed through hemodialysis.  Returned to baseline prior to discharge.  Consulted with Dr. Graylon Gunning prior to discharge, states that patient can resume hemodialysis on Tuesday, Thursday, and Saturday.  Patient has a scheduled hemodialysis on tomorrow morning.  He has establish transportation to hemodialysis.  Permanent A-fib: Patient will resume his home medications.  He is  currently not on anticoagulation.  Contacted patient's cardiologist's office and schedule first available appointment for follow-up.  Patient is aware of upcoming appointment on 12/08/2021 with PA. He will keep his current appointment with Dr. Tempie Hoist.   Anemia of chronic disease: Prior to discharge, patient's hemoglobin 8.1 g/dL, slightly below patient's baseline, however he does not warrant blood transfusion at this time.  He will continue Aranesp injections per nephrology. Patient also advised to continue hydroxyurea and folic acid. He currently does not have a hematologist.  Will send referral during appointment on 11/28/2021.  Patient's pain intensity is 4/10 and he is requesting discharge home.  He is aware of all upcoming appointments.  Also, call patient's pharmacy to ensure that medications were available for pickup.    Patient was therefore discharged home today in a hemodynamically stable condition.   Ines will follow-up with PCP within 1 week of this discharge. Dailan was counseled extensively about nonpharmacologic means of pain management, patient verbalized understanding and was appreciative of  the care received during this admission.   We discussed the need for good hydration, monitoring of hydration status, avoidance of heat, cold, stress, and infection triggers. We discussed the need to be adherent with taking Hydrea and other home medications. Patient was reminded of the need to seek medical attention immediately if any symptom of bleeding, anemia, or infection occurs.  Discharge Exam: Vitals:   11/24/21 0828 11/24/21 1149  BP: 94/77 94/65  Pulse: (!) 109 (!) 104  Resp: 18 20  Temp: 98.2 F (36.8 C) 98.2 F (36.8 C)  SpO2: 90% (!) 89%   Vitals:   11/24/21 0632 11/24/21 0800 11/24/21 0828 11/24/21 1149  BP: (!) 81/66  94/77 94/65  Pulse: (!) 103  (!) 109 (!) 104  Resp: 15 (!) 116 18 20  Temp: 98.1 F (36.7 C)  98.2 F (36.8 C) 98.2 F (36.8 C)  TempSrc:       SpO2: 94% 97% 90% (!) 89%  Weight:      Height:        General appearance : Awake, alert, not in any distress. Speech Clear. Not toxic looking HEENT: Atraumatic and Normocephalic, pupils equally reactive to light and accomodation Neck: Supple, no JVD. No cervical lymphadenopathy.  Chest: Good air entry bilaterally, no added sounds  CVS: S1 S2 regular, no murmurs.  Abdomen: Bowel sounds present, Non tender and not distended with no gaurding, rigidity or rebound. Extremities: B/L Lower Ext shows no edema, both legs are warm to touch Neurology: Awake alert, and oriented X 3, CN II-XII intact, Non focal Skin: No Rash  Discharge Instructions  Discharge Instructions     Discharge patient   Complete by: As directed    Discharge disposition: 01-Home or Self Care   Discharge patient date: 11/24/2021      Allergies as of 11/24/2021   No Known Allergies      Medication List     TAKE these medications    (feeding supplement) PROSource Plus liquid Take 30 mLs by mouth 2 (two) times daily between meals.   acetaminophen 500 MG tablet Commonly known as: TYLENOL  Take 1,000 mg by mouth every 6 (six) hours as needed for moderate pain.   allopurinol 100 MG tablet Commonly known as: ZYLOPRIM Take 1 tablet (100 mg total) by mouth daily.   Auryxia 1 GM 210 MG(Fe) tablet Generic drug: ferric citrate Take 210 mg by mouth 3 (three) times daily.   cinacalcet 30 MG tablet Commonly known as: SENSIPAR Take 1 tablet (30 mg total) by mouth Every Tuesday,Thursday,and Saturday with dialysis.   fluticasone 50 MCG/ACT nasal spray Commonly known as: FLONASE Place 2 sprays into both nostrils daily.   folic acid 1 MG tablet Commonly known as: FOLVITE TAKE 1 TABLET BY MOUTH EVERY DAY   hydroxyurea 500 MG capsule Commonly known as: HYDREA Take 1 capsule (500 mg total) by mouth daily.   lidocaine-prilocaine cream Commonly known as: EMLA Apply 1 application. topically Every  Tuesday,Thursday,and Saturday with dialysis.   multivitamin Tabs tablet Take 1 tablet by mouth at bedtime.   Oxycodone HCl 10 MG Tabs Take 1 tablet (10 mg total) by mouth every 6 (six) hours as needed. What changed: reasons to take this   pantoprazole 40 MG tablet Commonly known as: PROTONIX TAKE 1 TABLET BY MOUTH TWICE A DAY What changed: how to take this   tiZANidine 4 MG tablet Commonly known as: ZANAFLEX Take 0.5 tablets (2 mg total) by mouth every 8 (eight) hours as needed for muscle spasms.   torsemide 20 MG tablet Commonly known as: DEMADEX Take 20 mg by mouth in the morning, at noon, in the evening, and at bedtime.   Vitamin D (Ergocalciferol) 1.25 MG (50000 UNIT) Caps capsule Commonly known as: DRISDOL Take 1 capsule (50,000 Units total) by mouth once a week. Monday        The results of significant diagnostics from this hospitalization (including imaging, microbiology, ancillary and laboratory) are listed below for reference.    Significant Diagnostic Studies: DG Chest Portable 1 View  Result Date: 11/22/2021 CLINICAL DATA:  Fluid overload. EXAM: PORTABLE CHEST 1 VIEW COMPARISON:  November 21, 2021 (6:48 a.m.) FINDINGS: The cardiac silhouette is moderately enlarged and unchanged in size. There is marked severity calcification of the aortic arch. Diffuse, chronic appearing increased interstitial lung markings are seen. There is no evidence of focal consolidation, pleural effusion or pneumothorax. The visualized skeletal structures are unremarkable. IMPRESSION: 1. Stable cardiomegaly without evidence of acute or active cardiopulmonary disease. Electronically Signed   By: Virgina Norfolk M.D.   On: 11/22/2021 00:10   DG Chest Port 1 View  Result Date: 11/21/2021 CLINICAL DATA:  Sickle cell crisis.  Chest pain. EXAM: PORTABLE CHEST 1 VIEW COMPARISON:  09/13/2021 FINDINGS: Stable cardiac enlargement. Aortic atherosclerosis. Chronic interstitial coarsening is identified  bilaterally. No superimposed pleural effusion, interstitial edema or airspace consolidation. The visualized osseous structures are unremarkable. IMPRESSION: 1. No acute cardiopulmonary abnormalities. 2. Chronic interstitial coarsening. Electronically Signed   By: Kerby Moors M.D.   On: 11/21/2021 07:15    Microbiology: No results found for this or any previous visit (from the past 240 hour(s)).   Labs: Basic Metabolic Panel: Recent Labs  Lab 11/21/21 0850 11/21/21 2347 11/22/21 0350 11/22/21 0655 11/23/21 0530 11/23/21 0940  NA 130* 132* 130*  --   --  134*  K 5.3* 6.0* 6.6*   < > 4.7 4.0  CL 96* 92* 92*  --   --  95*  CO2  --  21* 20*  --   --  25  GLUCOSE 104* 106* 97  --   --  93  BUN 59* 71* 74*  --   --  42*  CREATININE 7.50* 7.19* 7.23*  --   --  4.35*  CALCIUM  --  8.8* 8.6*  --   --  8.4*  MG  --   --  2.1  --   --   --   PHOS  --   --  6.8*  --   --  5.6*   < > = values in this interval not displayed.   Liver Function Tests: Recent Labs  Lab 11/21/21 2347 11/22/21 0350 11/23/21 0940  AST 42*  --   --   ALT 29  --   --   ALKPHOS 194*  --   --   BILITOT 1.4*  --   --   PROT 8.4*  --   --   ALBUMIN 3.4* 3.1* 3.1*   No results for input(s): "LIPASE", "AMYLASE" in the last 168 hours. No results for input(s): "AMMONIA" in the last 168 hours. CBC: Recent Labs  Lab 11/21/21 0649 11/21/21 0850 11/21/21 2347 11/22/21 0350 11/23/21 0530 11/24/21 0938  WBC 6.5  --  7.3 8.5 10.9* 9.0  NEUTROABS 4.2  --  5.4 6.6  --  7.2  HGB 8.3* 10.2* 9.0* 7.9* 8.0* 8.1*  HCT 22.4* 30.0* 25.2* 21.9* 21.4* RESULTS UNAVAILABLE DUE TO INTERFERING SUBSTANCE  MCV 102.3*  --  103.7* 102.8* 100.5* RESULTS UNAVAILABLE DUE TO INTERFERING SUBSTANCE  PLT 192  --  192 174 142* 124*   Cardiac Enzymes: No results for input(s): "CKTOTAL", "CKMB", "CKMBINDEX", "TROPONINI" in the last 168 hours. BNP: Invalid input(s): "POCBNP" CBG: Recent Labs  Lab 11/22/21 0638  GLUCAP 109*     Time coordinating discharge: 50 minutes  Signed:  [name]  APRN, MSN, FNP-C [facility] [address] [place], [place] [address] [phone]  [facility] 11/24/2021, 12:32 PM

## 2021-11-24 NOTE — TOC Transition Note (Signed)
Transition of Care [facility]) - CM/SW Discharge Note   Patient Details  Name: Patrick [name] MRN: [id number] Date of Birth: [birth year]  Transition of Care [facility]) CM/SW Contact:  Tom-Johnson, Renea Ee, RN Phone Number: 11/24/2021, 1:14 PM   Clinical Narrative:     Patient is scheduled for discharge today. Admitted for Sickle Cell crisis.  From home alone. Does not have any children, no siblings and no parents. Has some cousins that are supportive with care and transports him to and from appointments. Patient also uses [facility].  Has a walker at home. PCP is [name], MD [facility] [address].  No TOC needs or recommendations noted. Patient denies any needs. Cousin to transport at discharge. No further TOC needs noted.   Final next level of care: Home/Self Care Barriers to Discharge: Barriers Resolved   Patient Goals and CMS Choice Patient states their goals for this hospitalization and ongoing recovery are:: To return home CMS Medicare.gov Compare Post Acute Care list provided to:: Patient Choice offered to / list presented to : NA  Discharge Placement                Patient to be transferred to facility by: Cousin      Discharge Plan and Services                DME Arranged: N/A DME Agency: NA       HH Arranged: NA HH Agency: NA        Social Determinants of Health (SDOH) Interventions     Readmission Risk Interventions    08/05/2020   11:54 AM  Readmission Risk Prevention Plan  Transportation Screening Complete  PCP or Specialist Appt within 3-5 Days Complete  HRI or [place] Complete  Social Work Consult for [place] Planning/Counseling Complete  Palliative Care Screening Not Applicable  Medication Review Press photographer) Complete

## 2021-11-24 NOTE — Progress Notes (Signed)
Case was discussed with sickle cell team.  Apparently after admission by [facility], patient is supposed to be on sickle cell team.  Patient also followed by nephrology for dialysis needs.  TRH will sign off.  No charge.

## 2021-11-25 DIAGNOSIS — N186 End stage renal disease: Secondary | ICD-10-CM | POA: Diagnosis not present

## 2021-11-25 DIAGNOSIS — N2581 Secondary hyperparathyroidism of renal origin: Secondary | ICD-10-CM | POA: Diagnosis not present

## 2021-11-25 DIAGNOSIS — Z992 Dependence on renal dialysis: Secondary | ICD-10-CM | POA: Diagnosis not present

## 2021-11-25 DIAGNOSIS — D57219 Sickle-cell/Hb-C disease with crisis, unspecified: Secondary | ICD-10-CM | POA: Diagnosis not present

## 2021-11-25 DIAGNOSIS — D571 Sickle-cell disease without crisis: Secondary | ICD-10-CM | POA: Diagnosis not present

## 2021-11-25 DIAGNOSIS — D57819 Other sickle-cell disorders with crisis, unspecified: Secondary | ICD-10-CM | POA: Diagnosis not present

## 2021-11-25 DIAGNOSIS — D5700 Hb-SS disease with crisis, unspecified: Secondary | ICD-10-CM | POA: Diagnosis not present

## 2021-11-26 NOTE — TOC Transition Note (Signed)
Transition of care contact from inpatient facility  Date of discharge: 11/24/21 Date of contact: 11/26/21 Method: Attempted Phone Call Spoke to: No answer  Tried calling patient to discuss transition of care from recent inpatient hospitalization but he did not pick up the phone. Left a voicemail to call back to [facility] HD unit [phone] for any questions or concerns. Plan for renal team to see patient during routine rounds.  Tobie Poet, NP

## 2021-11-28 ENCOUNTER — Encounter: Payer: Self-pay | Admitting: Family Medicine

## 2021-11-28 ENCOUNTER — Ambulatory Visit (INDEPENDENT_AMBULATORY_CARE_PROVIDER_SITE_OTHER): Payer: HMO | Admitting: Family Medicine

## 2021-11-28 VITALS — BP 94/67 | HR 94 | Temp 97.9°F | Ht 71.0 in | Wt 142.6 lb

## 2021-11-28 DIAGNOSIS — G894 Chronic pain syndrome: Secondary | ICD-10-CM | POA: Diagnosis not present

## 2021-11-28 DIAGNOSIS — D57219 Sickle-cell/Hb-C disease with crisis, unspecified: Secondary | ICD-10-CM | POA: Diagnosis not present

## 2021-11-28 DIAGNOSIS — N186 End stage renal disease: Secondary | ICD-10-CM | POA: Diagnosis not present

## 2021-11-28 DIAGNOSIS — E875 Hyperkalemia: Secondary | ICD-10-CM

## 2021-11-28 DIAGNOSIS — F17200 Nicotine dependence, unspecified, uncomplicated: Secondary | ICD-10-CM | POA: Diagnosis not present

## 2021-11-28 DIAGNOSIS — D57819 Other sickle-cell disorders with crisis, unspecified: Secondary | ICD-10-CM | POA: Diagnosis not present

## 2021-11-28 DIAGNOSIS — D631 Anemia in chronic kidney disease: Secondary | ICD-10-CM | POA: Diagnosis not present

## 2021-11-28 DIAGNOSIS — Z992 Dependence on renal dialysis: Secondary | ICD-10-CM | POA: Diagnosis not present

## 2021-11-28 DIAGNOSIS — D571 Sickle-cell disease without crisis: Secondary | ICD-10-CM

## 2021-11-28 DIAGNOSIS — E559 Vitamin D deficiency, unspecified: Secondary | ICD-10-CM

## 2021-11-28 DIAGNOSIS — D5700 Hb-SS disease with crisis, unspecified: Secondary | ICD-10-CM | POA: Diagnosis not present

## 2021-11-28 DIAGNOSIS — R52 Pain, unspecified: Secondary | ICD-10-CM | POA: Diagnosis not present

## 2021-11-28 DIAGNOSIS — N2581 Secondary hyperparathyroidism of renal origin: Secondary | ICD-10-CM | POA: Diagnosis not present

## 2021-11-28 MED ORDER — LIDOCAINE 5 % EX PTCH: 1.0000 | MEDICATED_PATCH | CUTANEOUS | 1 refills | Status: DC

## 2021-11-28 NOTE — Progress Notes (Signed)
Patient [facility] and Sickle Cell Care   Established Patient Office Visit  Subjective   Patient ID: Patrick [name], male    DOB: 04-28-1956  Age: 66 y.o. MRN: [id number]  Chief Complaint  Patient presents with   Hospitalization Follow-up    Patient is here today for his hospital follow up. Patient was hospitalized on 11/21/21 for sickle cell pain crisis and was discharged on 11/24/21. Patient states that he is still having some soreness and tenderness in his body since he was discharged from the hospital.    Patrick [name] is a 66 year old male with a medical history significant for sickle cell disease type SS, chronic pain syndrome, opiate dependence and tolerance, anemia of chronic disease, end-stage renal disease on TTS HD presents for post hospital follow-up. Patient presented to the emergency department at [facility] on 11/21/2021 for allover body pain.  Patient states that his body pain was consistent with his typical   Patient Active Problem List   Diagnosis Date Noted   Sickle cell anemia with crisis ([place]) 11/23/2021   Sickle cell pain crisis ([place]) 11/22/2021   Hospital discharge follow-up 09/28/2021   Chronic pain syndrome    Acute on chronic anemia 09/13/2021   Chronic atrial fibrillation with RVR ([place]) 09/13/2021   Hypothermia 08/22/2021   Abdominal fluid collection 08/22/2021   Gastric AVM    Hiatal hernia    Pressure injury of skin 08/01/2021   Hyponatremia 07/31/2021   Hypomagnesemia 07/31/2021   Upper GI bleeding 06/27/2021   H/O major abdominal surgery 06/27/2021   Visual impairment 06/27/2021   COPD not affecting current episode of care ([place]) 06/27/2021   Empyema of gallbladder 05/27/2021   Acute gangrenous cholecystitis s/p open cholcystectomy 05/24/2021 05/27/2021   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 05/24/2021 05/27/2021   Acute blood loss anemia    Protein-calorie malnutrition, severe 05/23/2021   SBO (small bowel  obstruction) ([place]) 05/19/2021   Acute upper GI bleed 08/02/2020   Prolonged QT interval 08/02/2020   Upper GI bleed 08/02/2020   Hematemesis with nausea    Chronic anticoagulation    Tobacco dependence 05/28/2020   Muscle spasm of back 56/21/3086   Chronic systolic CHF (congestive heart failure) ([place]) 09/11/2019   Hypoxia 06/02/2018   Symptomatic anemia    Chronic heart failure with preserved ejection fraction ([place])    Colon cancer screening 07/16/2017   Encounter for therapeutic drug monitoring 12/29/2015   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   Permanent atrial fibrillation ([place]) 12/24/2015   Cough with sputum 10/25/2015   Muscle tightness-Right arm  05/27/2014   Arm pain, right 05/06/2014   ESRD on HD TTS 05/06/2014   Chest pain 01/13/2014   Vitamin D deficiency 09/25/2013   CKD (chronic kidney disease), stage IV (HCC) 09/25/2013   Elevated uric acid in blood 09/25/2013   Hb-SS disease without crisis ([place]) 09/25/2013   Anemia 09/25/2013   Onychomycosis of toenail 08/27/2013   Hyperglycemia 08/27/2013   CHF (congestive heart failure) ([place]) 08/19/2013   Nausea & vomiting 02/07/2013   Sickle cell anemia ([place]) 11/21/2012   Abdominal pain 01/17/2012   NSVT (nonsustained ventricular tachycardia) ([place]) 10/21/2011   Gout 10/19/2011   Sickle-cell thalassemia with crisis ([place]) 10/17/2011   Chronic combined systolic and diastolic CHF (congestive heart failure) ([place]) 10/17/2011   Essential hypertension 06/07/2011   Past Medical History:  Diagnosis Date   Blood dyscrasia    Blood transfusion    "I've had a bunch  of them"   CHF (congestive heart failure) ([place])    followed by hf clinic   Chronic kidney disease (CKD), stage III (moderate) ([place])    Dialysis patient ([place]) 04/2019   AV fistula (right arm)   Gout    Headache(784.0)    "probably weekly" (01/13/2014)   Legally blind    Shortness of breath dyspnea    Sickle cell disease, type SS (HCC)    Swelling abdomen    Swelling of  extremity, left    Swelling of extremity, right    Past Surgical History:  Procedure Laterality Date   A/V FISTULAGRAM Right 07/29/2020   Procedure: A/V FISTULAGRAM - Right Arm;  Surgeon: Angelia Mould, MD;  Location: [place] CV LAB;  Service: Cardiovascular;  Laterality: Right;   BASCILIC VEIN TRANSPOSITION Right 04/12/2014   Procedure: BASILIC VEIN TRANSPOSITION;  Surgeon: Elam Dutch, MD;  Location: [place];  Service: Vascular;  Laterality: Right;   BIOPSY  08/04/2020   Procedure: BIOPSY;  Surgeon: Ladene Artist, MD;  Location[facility] ENDOSCOPY;  Service: Gastroenterology;;   BIOPSY  06/28/2021   Procedure: BIOPSY;  Surgeon: Jerene Bears, MD;  Location: [facility] ENDOSCOPY;  Service: Gastroenterology;;   BOWEL RESECTION N/A 05/24/2021   Procedure: SMALL BOWEL RESECTION;  Surgeon: Rolm Bookbinder, MD;  Location: [place];  Service: General;  Laterality: N/A;   CARDIAC CATHETERIZATION  05/2011   CARDIOVERSION N/A 06/05/2018   Procedure: CARDIOVERSION;  Surgeon: Jolaine Artist, MD;  Location: [place];  Service: Cardiovascular;  Laterality: N/A;   CARDIOVERSION N/A 10/06/2019   Procedure: CARDIOVERSION;  Surgeon: Jolaine Artist, MD;  Location: [facility] ENDOSCOPY;  Service: Cardiovascular;  Laterality: N/A;   CHOLECYSTECTOMY N/A 05/24/2021   Procedure: OPEN CHOLECYSTECTOMY;  Surgeon: Rolm Bookbinder, MD;  Location: [place];  Service: General;  Laterality: N/A;   COLONOSCOPY WITH PROPOFOL N/A 09/24/2017   Procedure: COLONOSCOPY WITH PROPOFOL;  Surgeon: Jackquline Denmark, MD;  Location: [place];  Service: Endoscopy;  Laterality: N/A;   ESOPHAGOGASTRODUODENOSCOPY N/A 09/19/2017   Procedure: ESOPHAGOGASTRODUODENOSCOPY (EGD);  Surgeon: Jackquline Denmark, MD;  Location:  [facility] ENDOSCOPY;  Service: Endoscopy;  Laterality: N/A;   ESOPHAGOGASTRODUODENOSCOPY N/A 05/29/2021   Procedure: ESOPHAGOGASTRODUODENOSCOPY (EGD);  Surgeon: Milus Banister, MD;  Location: [facility] ENDOSCOPY;  Service:  Gastroenterology;  Laterality: N/A;   ESOPHAGOGASTRODUODENOSCOPY (EGD) WITH PROPOFOL N/A 08/04/2020   Procedure: ESOPHAGOGASTRODUODENOSCOPY (EGD) WITH PROPOFOL;  Surgeon: Ladene Artist, MD;  Location: [place];  Service: Gastroenterology;  Laterality: N/A;   ESOPHAGOGASTRODUODENOSCOPY (EGD) WITH PROPOFOL N/A 06/28/2021   Procedure: ESOPHAGOGASTRODUODENOSCOPY (EGD) WITH PROPOFOL;  Surgeon: Jerene Bears, MD;  Location: [facility] ENDOSCOPY;  Service: Gastroenterology;  Laterality: N/A;   ESOPHAGOGASTRODUODENOSCOPY (EGD) WITH PROPOFOL N/A 08/10/2021   Procedure: ESOPHAGOGASTRODUODENOSCOPY (EGD) WITH PROPOFOL;  Surgeon: Jackquline Denmark, MD;  Location: [place];  Service: Gastroenterology;  Laterality: N/A;   ESOPHAGOGASTRODUODENOSCOPY (EGD) WITH PROPOFOL N/A 08/02/2021   Procedure: ESOPHAGOGASTRODUODENOSCOPY (EGD) WITH PROPOFOL;  Surgeon: Lavena Bullion, DO;  Location: [place];  Service: Gastroenterology;  Laterality: N/A;   HEMOSTASIS CLIP PLACEMENT  05/29/2021   Procedure: HEMOSTASIS CLIP PLACEMENT;  Surgeon: Milus Banister, MD;  Location: [facility] ENDOSCOPY;  Service: Gastroenterology;;   HOT HEMOSTASIS N/A 05/29/2021   Procedure: HOT HEMOSTASIS (ARGON PLASMA COAGULATION/BICAP);  Surgeon: Milus Banister, MD;  Location: [facility] ENDOSCOPY;  Service: Gastroenterology;  Laterality: N/A;   HOT HEMOSTASIS N/A 06/28/2021   Procedure: HOT HEMOSTASIS (ARGON PLASMA COAGULATION/BICAP);  Surgeon: Jerene Bears, MD;  Location: [facility] ENDOSCOPY;  Service:  Gastroenterology;  Laterality: N/A;   HOT HEMOSTASIS N/A 08/10/2021   Procedure: HOT HEMOSTASIS (ARGON PLASMA COAGULATION/BICAP);  Surgeon: Jackquline Denmark, MD;  Location: [facility] ENDOSCOPY;  Service: Gastroenterology;  Laterality: N/A;   HOT HEMOSTASIS N/A 08/02/2021   Procedure: HOT HEMOSTASIS (ARGON PLASMA COAGULATION/BICAP);  Surgeon: Lavena Bullion, DO;  Location: [facility] ENDOSCOPY;  Service: Gastroenterology;  Laterality: N/A;   IR PERC TUN PERIT CATH WO PORT S&I /IMAG   05/23/2021   IR REMOVAL TUN CV CATH W/O FL  06/10/2021   IR US GUIDE VASC ACCESS RIGHT  05/23/2021   LAPAROSCOPY N/A 05/24/2021   Procedure: LAPAROSCOPY DIAGNOSTIC;  Surgeon: Rolm Bookbinder, MD;  Location: [place];  Service: General;  Laterality: N/A;   LAPAROTOMY N/A 05/24/2021   Procedure: EXPLORATORY LAPAROTOMY;  Surgeon: Rolm Bookbinder, MD;  Location: [place];  Service: General;  Laterality: N/A;   LEFT AND RIGHT HEART CATHETERIZATION WITH CORONARY ANGIOGRAM N/A 06/11/2011   Procedure: LEFT AND RIGHT HEART CATHETERIZATION WITH CORONARY ANGIOGRAM;  Surgeon: Larey Dresser, MD;  Location: [facility] CATH LAB;  Service: Cardiovascular;  Laterality: N/A;   PERIPHERAL VASCULAR BALLOON ANGIOPLASTY Right 07/29/2020   Procedure: PERIPHERAL VASCULAR BALLOON ANGIOPLASTY;  Surgeon: Angelia Mould, MD;  Location: [place] CV LAB;  Service: Cardiovascular;  Laterality: Right;  arm fistula   REVISON OF ARTERIOVENOUS FISTULA Right 08/08/2020   Procedure: ANEURYSM EXCISION OF RIGHT UPPER EXTREMITY ARTERIOVENOUS FISTULA;  Surgeon: Angelia Mould, MD;  Location: [facility] OR;  Service: Vascular;  Laterality: Right;   SCLEROTHERAPY  05/29/2021   Procedure: Clide Deutscher;  Surgeon: Milus Banister, MD;  Location: [facility] ENDOSCOPY;  Service: Gastroenterology;;   Lavell Islam REMOVAL  05/29/2021   Procedure: STENT REMOVAL;  Surgeon: Milus Banister, MD;  Location: [facility] ENDOSCOPY;  Service: Gastroenterology;;   TEE WITHOUT CARDIOVERSION N/A 12/27/2015   Procedure: TRANSESOPHAGEAL ECHOCARDIOGRAM (TEE);  Surgeon: Sueanne Margarita, MD;  Location: [facility] ENDOSCOPY;  Service: Cardiovascular;  Laterality: N/A;   Social History   Tobacco Use   Smoking status: Former    Packs/day: 0.50    Years: 41.00    Total pack years: 20.50    Types: Cigarettes    Quit date: 12/08/2015    Years since quitting: 5.9   Smokeless tobacco: Never  Vaping Use   Vaping Use: Never used  Substance Use Topics   Alcohol use: Not Currently     Alcohol/week: 3.0 standard drinks of alcohol    Types: 1 Glasses of wine, 2 Cans of beer per week    Comment: occasional   Drug use: No   Social History   Socioeconomic History   Marital status: Divorced    Spouse name: Not on file   Number of children: Not on file   Years of education: Not on file   Highest education level: Not on file  Occupational History   Occupation: disabled  Tobacco Use   Smoking status: Former    Packs/day: 0.50    Years: 41.00    Total pack years: 20.50    Types: Cigarettes    Quit date: 12/08/2015    Years since quitting: 5.9   Smokeless tobacco: Never  Vaping Use   Vaping Use: Never used  Substance and Sexual Activity   Alcohol use: Not Currently    Alcohol/week: 3.0 standard drinks of alcohol    Types: 1 Glasses of wine, 2 Cans of beer per week    Comment: occasional   Drug use: No   Sexual activity: Not Currently  Other Topics Concern   Not on file  Social History Narrative   Not on file   Social Determinants of Health   Financial Resource Strain: Low Risk  (06/02/2021)   Overall Financial Resource Strain (CARDIA)    Difficulty of Paying Living Expenses: Not very hard  Food Insecurity: No Food Insecurity (06/02/2021)   Hunger Vital Sign    Worried About Running Out of Food in the Last Year: Never true    Ran Out of Food in the Last Year: Never true  Transportation Needs: No Transportation Needs (06/02/2021)   PRAPARE - Hydrologist (Medical): No    Lack of Transportation (Non-Medical): No  Physical Activity: Sufficiently Active (10/01/2019)   Exercise Vital Sign    Days of Exercise per Week: 5 days    Minutes of Exercise per Session: 90 min  Stress: Not on file  Social Connections: Not on file  Intimate Partner Violence: Not on file   Family Status  Relation Name Status   Mother  Deceased   Father  Deceased   Family History  Problem Relation Age of Onset   Alcohol abuse Mother    Liver disease  Mother    Cirrhosis Mother    Heart attack Mother    No Known Allergies    Review of Systems  Constitutional:  Negative for chills and fever.  HENT: Negative.    Eyes: Negative.   Respiratory: Negative.    Cardiovascular: Negative.   Gastrointestinal: Negative.   Genitourinary: Negative.   Musculoskeletal:  Positive for back pain and joint pain.  Skin: Negative.   Neurological: Negative.   Psychiatric/Behavioral: Negative.        Objective:     BP 94/67   Pulse 94   Temp 97.9 F (36.6 C)   Ht 5\' 11"  (1.803 m)   Wt 142 lb 9.6 oz (64.7 kg)   SpO2 100%   BMI 19.89 kg/m  BP Readings from Last 3 Encounters:  11/28/21 94/67  11/24/21 93/62  11/21/21 99/68   Wt Readings from Last 3 Encounters:  11/28/21 142 lb 9.6 oz (64.7 kg)  11/23/21 136 lb 7.4 oz (61.9 kg)  11/21/21 134 lb (60.8 kg)      Physical Exam Constitutional:      Appearance: Normal appearance.  Eyes:     Pupils: Pupils are equal, round, and reactive to light.  Cardiovascular:     Rate and Rhythm: Normal rate and regular rhythm.     Pulses: Normal pulses.  Pulmonary:     Effort: Pulmonary effort is normal.  Abdominal:     General: Bowel sounds are normal.  Musculoskeletal:        General: Normal range of motion.  Skin:    General: Skin is warm.  Neurological:     General: No focal deficit present.     Mental Status: He is alert. Mental status is at baseline.  Psychiatric:        Mood and Affect: Mood normal.        Behavior: Behavior normal.        Thought Content: Thought content normal.        Judgment: Judgment normal.     No results found for any visits on 11/28/21.  Last CBC Lab Results  Component Value Date   WBC 9.0 11/24/2021   HGB 8.1 (L) 11/24/2021   HCT RESULTS UNAVAILABLE DUE TO INTERFERING SUBSTANCE 11/24/2021   MCV RESULTS UNAVAILABLE DUE TO INTERFERING SUBSTANCE  11/24/2021   MCH RESULTS UNAVAILABLE DUE TO INTERFERING SUBSTANCE 11/24/2021   RDW RESULTS UNAVAILABLE  DUE TO INTERFERING SUBSTANCE 11/24/2021   PLT 124 (L) 21/22/4825   Last metabolic panel Lab Results  Component Value Date   GLUCOSE 116 (H) 11/24/2021   NA 132 (L) 11/24/2021   K 4.0 11/24/2021   CL 93 (L) 11/24/2021   CO2 26 11/24/2021   BUN 43 (H) 11/24/2021   CREATININE 4.45 (H) 11/24/2021   GFRNONAA 14 (L) 11/24/2021   CALCIUM 8.7 (L) 11/24/2021   PHOS 5.6 (H) 11/23/2021   PROT 8.0 11/24/2021   ALBUMIN 3.2 (L) 11/24/2021   LABGLOB 3.9 09/04/2021   AGRATIO 0.7 (L) 09/04/2021   BILITOT 2.5 (H) 11/24/2021   ALKPHOS 339 (H) 11/24/2021   AST 54 (H) 11/24/2021   ALT 42 11/24/2021   ANIONGAP 13 11/24/2021   Last lipids Lab Results  Component Value Date   CHOL 114 (L) 02/17/2015   HDL 31 (L) 02/17/2015   LDLCALC 68 02/17/2015   TRIG 42 06/05/2021   CHOLHDL 3.7 02/17/2015   Last hemoglobin A1c Lab Results  Component Value Date   HGBA1C 4.9 05/25/2021   Last thyroid functions Lab Results  Component Value Date   TSH 2.301 02/17/2015   Last vitamin D Lab Results  Component Value Date   VD25OH 13.7 (L) 05/24/2020   Last vitamin B12 and Folate Lab Results  Component Value Date   VITAMINB12 908 06/04/2018   FOLATE 44.0 06/04/2018      The ASCVD Risk score (Arnett DK, et al., 2019) failed to calculate for the following reasons:   Cannot find a previous HDL lab   Cannot find a previous total cholesterol lab    Assessment & Plan:   Problem List Items Addressed This Visit   None   No follow-ups on file.   [name]  APRN, MSN, FNP-C Patient [place] [address] [place], [place] [address] [phone]

## 2021-11-29 LAB — CBC WITH DIFFERENTIAL/PLATELET
Basophils Absolute: 0 10*3/uL (ref 0.0–0.2)
Basos: 1 %
EOS (ABSOLUTE): 0.1 10*3/uL (ref 0.0–0.4)
Eos: 1 %
Hematocrit: 21.1 % — ABNORMAL LOW (ref 37.5–51.0)
Hemoglobin: 7.5 g/dL — ABNORMAL LOW (ref 13.0–17.7)
Immature Grans (Abs): 0.2 10*3/uL — ABNORMAL HIGH (ref 0.0–0.1)
Immature Granulocytes: 2 %
Lymphocytes Absolute: 0.4 10*3/uL — ABNORMAL LOW (ref 0.7–3.1)
Lymphs: 6 %
MCH: 36.8 pg — ABNORMAL HIGH (ref 26.6–33.0)
MCHC: 35.5 g/dL (ref 31.5–35.7)
MCV: 103 fL — ABNORMAL HIGH (ref 79–97)
Monocytes Absolute: 1 10*3/uL — ABNORMAL HIGH (ref 0.1–0.9)
Monocytes: 14 %
NRBC: 39 % — ABNORMAL HIGH (ref 0–0)
Neutrophils Absolute: 5.4 10*3/uL (ref 1.4–7.0)
Neutrophils: 76 %
Platelets: 167 10*3/uL (ref 150–450)
RBC: 2.04 x10E6/uL — CL (ref 4.14–5.80)
RDW: 22.5 % — ABNORMAL HIGH (ref 11.6–15.4)
WBC: 7.1 10*3/uL (ref 3.4–10.8)

## 2021-11-29 LAB — POTASSIUM: Potassium: 4.4 mmol/L (ref 3.5–5.2)

## 2021-11-29 LAB — VITAMIN D 25 HYDROXY (VIT D DEFICIENCY, FRACTURES): Vit D, 25-Hydroxy: 96.3 ng/mL (ref 30.0–100.0)

## 2021-11-30 ENCOUNTER — Telehealth: Payer: Self-pay

## 2021-11-30 DIAGNOSIS — D509 Iron deficiency anemia, unspecified: Secondary | ICD-10-CM | POA: Insufficient documentation

## 2021-11-30 DIAGNOSIS — N19 Unspecified kidney failure: Secondary | ICD-10-CM | POA: Insufficient documentation

## 2021-11-30 DIAGNOSIS — K8020 Calculus of gallbladder without cholecystitis without obstruction: Secondary | ICD-10-CM | POA: Insufficient documentation

## 2021-11-30 DIAGNOSIS — I509 Heart failure, unspecified: Secondary | ICD-10-CM | POA: Insufficient documentation

## 2021-11-30 NOTE — Telephone Encounter (Signed)
lidocaine (LIDODERM) 5 %   PA has been started on 11/30/21.

## 2021-12-01 ENCOUNTER — Other Ambulatory Visit: Payer: Self-pay | Admitting: Nurse Practitioner

## 2021-12-01 ENCOUNTER — Other Ambulatory Visit: Payer: Self-pay | Admitting: Family Medicine

## 2021-12-04 ENCOUNTER — Other Ambulatory Visit: Payer: Self-pay | Admitting: Family Medicine

## 2021-12-04 ENCOUNTER — Telehealth: Payer: Self-pay | Admitting: Internal Medicine

## 2021-12-04 DIAGNOSIS — M545 Low back pain, unspecified: Secondary | ICD-10-CM

## 2021-12-04 DIAGNOSIS — D571 Sickle-cell disease without crisis: Secondary | ICD-10-CM

## 2021-12-04 MED ORDER — TIZANIDINE HCL 4 MG PO TABS: 2.0000 mg | ORAL_TABLET | Freq: Three times a day (TID) | ORAL | 1 refills | Status: DC | PRN

## 2021-12-04 MED ORDER — OXYCODONE HCL 10 MG PO TABS: 10.0000 mg | ORAL_TABLET | Freq: Four times a day (QID) | ORAL | 0 refills | Status: DC | PRN

## 2021-12-04 NOTE — Telephone Encounter (Signed)
Pain meds and back relaxer refill request

## 2021-12-04 NOTE — Progress Notes (Signed)
Reviewed PDMP substance reporting system prior to prescribing opiate medications. No inconsistencies noted.  Meds ordered this encounter  Medications   Oxycodone HCl 10 MG TABS    Sig: Take 1 tablet (10 mg total) by mouth every 6 (six) hours as needed.    Dispense:  60 tablet    Refill:  0    Order Specific Question:   Supervising Provider    Answer:   [name] [[id number]]   tiZANidine (ZANAFLEX) 4 MG tablet    Sig: Take 0.5 tablets (2 mg total) by mouth every 8 (eight) hours as needed for muscle spasms.    Dispense:  30 tablet    Refill:  1    Order Specific Question:   Supervising Provider    Answer:   [name] [[id number]]   [name]  APRN, MSN, FNP-C Patient [place] [address] [place], [place] [address] [phone]

## 2021-12-05 ENCOUNTER — Ambulatory Visit: Payer: HMO | Admitting: Nurse Practitioner

## 2021-12-05 ENCOUNTER — Other Ambulatory Visit: Payer: Self-pay | Admitting: Family Medicine

## 2021-12-05 ENCOUNTER — Telehealth: Payer: Self-pay

## 2021-12-05 DIAGNOSIS — D631 Anemia in chronic kidney disease: Secondary | ICD-10-CM | POA: Diagnosis not present

## 2021-12-05 DIAGNOSIS — N186 End stage renal disease: Secondary | ICD-10-CM | POA: Diagnosis not present

## 2021-12-05 DIAGNOSIS — D57819 Other sickle-cell disorders with crisis, unspecified: Secondary | ICD-10-CM | POA: Diagnosis not present

## 2021-12-05 DIAGNOSIS — D57219 Sickle-cell/Hb-C disease with crisis, unspecified: Secondary | ICD-10-CM | POA: Diagnosis not present

## 2021-12-05 DIAGNOSIS — D5700 Hb-SS disease with crisis, unspecified: Secondary | ICD-10-CM | POA: Diagnosis not present

## 2021-12-05 DIAGNOSIS — D571 Sickle-cell disease without crisis: Secondary | ICD-10-CM | POA: Diagnosis not present

## 2021-12-05 DIAGNOSIS — D509 Iron deficiency anemia, unspecified: Secondary | ICD-10-CM | POA: Diagnosis not present

## 2021-12-05 DIAGNOSIS — Z992 Dependence on renal dialysis: Secondary | ICD-10-CM | POA: Diagnosis not present

## 2021-12-05 DIAGNOSIS — N2581 Secondary hyperparathyroidism of renal origin: Secondary | ICD-10-CM | POA: Diagnosis not present

## 2021-12-05 NOTE — Telephone Encounter (Signed)
Pt asked for pain med and his muscle relaxer

## 2021-12-06 ENCOUNTER — Other Ambulatory Visit: Payer: Self-pay | Admitting: Family Medicine

## 2021-12-06 ENCOUNTER — Telehealth: Payer: Self-pay | Admitting: Internal Medicine

## 2021-12-06 DIAGNOSIS — D571 Sickle-cell disease without crisis: Secondary | ICD-10-CM

## 2021-12-06 MED ORDER — OXYCODONE HCL 10 MG PO TABS: 10.0000 mg | ORAL_TABLET | Freq: Four times a day (QID) | ORAL | 0 refills | Status: DC | PRN

## 2021-12-06 NOTE — Telephone Encounter (Signed)
Pt's insurance called and asked [place] to change the [facility] location [facility] [address] [address]  [facility] of [facility] of the [facility]ycodone. They were Able to transfer zanaflex but not the oxycodone

## 2021-12-06 NOTE — Progress Notes (Signed)
Reviewed PDMP substance reporting system prior to prescribing opiate medications. No inconsistencies noted.  Pharmacy changed.   Meds ordered this encounter  Medications   Oxycodone HCl 10 MG TABS    Sig: Take 1 tablet (10 mg total) by mouth every 6 (six) hours as needed.    Dispense:  60 tablet    Refill:  0    Order Specific Question:   Supervising Provider    Answer:   [name] [[id number]]     [name]  APRN, MSN, FNP-C Patient [place] [address] [place], [place] [address] [phone]

## 2021-12-08 ENCOUNTER — Encounter (HOSPITAL_COMMUNITY): Payer: HMO

## 2021-12-08 ENCOUNTER — Other Ambulatory Visit: Payer: Self-pay | Admitting: Nurse Practitioner

## 2021-12-14 DIAGNOSIS — D631 Anemia in chronic kidney disease: Secondary | ICD-10-CM | POA: Diagnosis not present

## 2021-12-14 DIAGNOSIS — D57219 Sickle-cell/Hb-C disease with crisis, unspecified: Secondary | ICD-10-CM | POA: Diagnosis not present

## 2021-12-14 DIAGNOSIS — D5700 Hb-SS disease with crisis, unspecified: Secondary | ICD-10-CM | POA: Diagnosis not present

## 2021-12-14 DIAGNOSIS — N186 End stage renal disease: Secondary | ICD-10-CM | POA: Diagnosis not present

## 2021-12-14 DIAGNOSIS — D571 Sickle-cell disease without crisis: Secondary | ICD-10-CM | POA: Diagnosis not present

## 2021-12-14 DIAGNOSIS — D57819 Other sickle-cell disorders with crisis, unspecified: Secondary | ICD-10-CM | POA: Diagnosis not present

## 2021-12-14 DIAGNOSIS — N2581 Secondary hyperparathyroidism of renal origin: Secondary | ICD-10-CM | POA: Diagnosis not present

## 2021-12-14 DIAGNOSIS — Z992 Dependence on renal dialysis: Secondary | ICD-10-CM | POA: Diagnosis not present

## 2021-12-15 DIAGNOSIS — I158 Other secondary hypertension: Secondary | ICD-10-CM | POA: Diagnosis not present

## 2021-12-15 DIAGNOSIS — N186 End stage renal disease: Secondary | ICD-10-CM | POA: Diagnosis not present

## 2021-12-15 DIAGNOSIS — Z992 Dependence on renal dialysis: Secondary | ICD-10-CM | POA: Diagnosis not present

## 2021-12-16 DIAGNOSIS — D57219 Sickle-cell/Hb-C disease with crisis, unspecified: Secondary | ICD-10-CM | POA: Diagnosis not present

## 2021-12-16 DIAGNOSIS — D57819 Other sickle-cell disorders with crisis, unspecified: Secondary | ICD-10-CM | POA: Diagnosis not present

## 2021-12-16 DIAGNOSIS — Z992 Dependence on renal dialysis: Secondary | ICD-10-CM | POA: Diagnosis not present

## 2021-12-16 DIAGNOSIS — N2581 Secondary hyperparathyroidism of renal origin: Secondary | ICD-10-CM | POA: Diagnosis not present

## 2021-12-16 DIAGNOSIS — D571 Sickle-cell disease without crisis: Secondary | ICD-10-CM | POA: Diagnosis not present

## 2021-12-16 DIAGNOSIS — D5700 Hb-SS disease with crisis, unspecified: Secondary | ICD-10-CM | POA: Diagnosis not present

## 2021-12-16 DIAGNOSIS — N186 End stage renal disease: Secondary | ICD-10-CM | POA: Diagnosis not present

## 2021-12-19 DIAGNOSIS — D57819 Other sickle-cell disorders with crisis, unspecified: Secondary | ICD-10-CM | POA: Diagnosis not present

## 2021-12-19 DIAGNOSIS — D571 Sickle-cell disease without crisis: Secondary | ICD-10-CM | POA: Diagnosis not present

## 2021-12-19 DIAGNOSIS — Z992 Dependence on renal dialysis: Secondary | ICD-10-CM | POA: Diagnosis not present

## 2021-12-19 DIAGNOSIS — D5700 Hb-SS disease with crisis, unspecified: Secondary | ICD-10-CM | POA: Diagnosis not present

## 2021-12-19 DIAGNOSIS — D631 Anemia in chronic kidney disease: Secondary | ICD-10-CM | POA: Diagnosis not present

## 2021-12-19 DIAGNOSIS — N2581 Secondary hyperparathyroidism of renal origin: Secondary | ICD-10-CM | POA: Diagnosis not present

## 2021-12-19 DIAGNOSIS — D57219 Sickle-cell/Hb-C disease with crisis, unspecified: Secondary | ICD-10-CM | POA: Diagnosis not present

## 2021-12-19 DIAGNOSIS — N186 End stage renal disease: Secondary | ICD-10-CM | POA: Diagnosis not present

## 2021-12-26 DIAGNOSIS — D57219 Sickle-cell/Hb-C disease with crisis, unspecified: Secondary | ICD-10-CM | POA: Diagnosis not present

## 2021-12-26 DIAGNOSIS — D631 Anemia in chronic kidney disease: Secondary | ICD-10-CM | POA: Diagnosis not present

## 2021-12-26 DIAGNOSIS — D5700 Hb-SS disease with crisis, unspecified: Secondary | ICD-10-CM | POA: Diagnosis not present

## 2021-12-26 DIAGNOSIS — N186 End stage renal disease: Secondary | ICD-10-CM | POA: Diagnosis not present

## 2021-12-26 DIAGNOSIS — N2581 Secondary hyperparathyroidism of renal origin: Secondary | ICD-10-CM | POA: Diagnosis not present

## 2021-12-26 DIAGNOSIS — Z992 Dependence on renal dialysis: Secondary | ICD-10-CM | POA: Diagnosis not present

## 2021-12-26 DIAGNOSIS — D571 Sickle-cell disease without crisis: Secondary | ICD-10-CM | POA: Diagnosis not present

## 2021-12-26 DIAGNOSIS — D57819 Other sickle-cell disorders with crisis, unspecified: Secondary | ICD-10-CM | POA: Diagnosis not present

## 2021-12-28 ENCOUNTER — Encounter (HOSPITAL_COMMUNITY): Payer: Self-pay

## 2022-01-01 DIAGNOSIS — D5740 Sickle-cell thalassemia without crisis: Secondary | ICD-10-CM | POA: Diagnosis not present

## 2022-01-01 DIAGNOSIS — Z992 Dependence on renal dialysis: Secondary | ICD-10-CM | POA: Diagnosis not present

## 2022-01-01 DIAGNOSIS — I482 Chronic atrial fibrillation, unspecified: Secondary | ICD-10-CM | POA: Diagnosis not present

## 2022-01-01 DIAGNOSIS — Z515 Encounter for palliative care: Secondary | ICD-10-CM | POA: Diagnosis not present

## 2022-01-01 DIAGNOSIS — N186 End stage renal disease: Secondary | ICD-10-CM | POA: Diagnosis not present

## 2022-01-04 ENCOUNTER — Ambulatory Visit (HOSPITAL_BASED_OUTPATIENT_CLINIC_OR_DEPARTMENT_OTHER)
Admission: RE | Admit: 2022-01-04 | Discharge: 2022-01-04 | Disposition: A | Discharge status: Home / Self Care | Payer: HMO | Source: Ambulatory Visit | Attending: Internal Medicine | Admitting: Internal Medicine

## 2022-01-04 ENCOUNTER — Encounter (HOSPITAL_COMMUNITY): Payer: Self-pay | Admitting: Internal Medicine

## 2022-01-04 ENCOUNTER — Encounter (HOSPITAL_BASED_OUTPATIENT_CLINIC_OR_DEPARTMENT_OTHER): Payer: Self-pay

## 2022-01-04 ENCOUNTER — Emergency Department (HOSPITAL_BASED_OUTPATIENT_CLINIC_OR_DEPARTMENT_OTHER)
Admission: EM | Admit: 2022-01-04 | Discharge: 2022-01-04 | Disposition: A | Discharge status: Home / Self Care | Payer: HMO | Attending: Emergency Medicine | Admitting: Emergency Medicine

## 2022-01-04 VITALS — BP 110/70 | HR 99 | Wt 135.4 lb

## 2022-01-04 DIAGNOSIS — D571 Sickle-cell disease without crisis: Secondary | ICD-10-CM | POA: Diagnosis not present

## 2022-01-04 DIAGNOSIS — I132 Hypertensive heart and chronic kidney disease with heart failure and with stage 5 chronic kidney disease, or end stage renal disease: Secondary | ICD-10-CM | POA: Diagnosis not present

## 2022-01-04 DIAGNOSIS — D5700 Hb-SS disease with crisis, unspecified: Secondary | ICD-10-CM | POA: Diagnosis not present

## 2022-01-04 DIAGNOSIS — D649 Anemia, unspecified: Secondary | ICD-10-CM | POA: Insufficient documentation

## 2022-01-04 DIAGNOSIS — I428 Other cardiomyopathies: Secondary | ICD-10-CM | POA: Insufficient documentation

## 2022-01-04 DIAGNOSIS — D509 Iron deficiency anemia, unspecified: Secondary | ICD-10-CM | POA: Diagnosis not present

## 2022-01-04 DIAGNOSIS — Z87891 Personal history of nicotine dependence: Secondary | ICD-10-CM | POA: Diagnosis not present

## 2022-01-04 DIAGNOSIS — I272 Pulmonary hypertension, unspecified: Secondary | ICD-10-CM

## 2022-01-04 DIAGNOSIS — J449 Chronic obstructive pulmonary disease, unspecified: Secondary | ICD-10-CM | POA: Insufficient documentation

## 2022-01-04 DIAGNOSIS — K56609 Unspecified intestinal obstruction, unspecified as to partial versus complete obstruction: Secondary | ICD-10-CM | POA: Insufficient documentation

## 2022-01-04 DIAGNOSIS — N186 End stage renal disease: Secondary | ICD-10-CM | POA: Diagnosis not present

## 2022-01-04 DIAGNOSIS — Z8719 Personal history of other diseases of the digestive system: Secondary | ICD-10-CM | POA: Insufficient documentation

## 2022-01-04 DIAGNOSIS — K31811 Angiodysplasia of stomach and duodenum with bleeding: Secondary | ICD-10-CM | POA: Insufficient documentation

## 2022-01-04 DIAGNOSIS — Z992 Dependence on renal dialysis: Secondary | ICD-10-CM | POA: Diagnosis not present

## 2022-01-04 DIAGNOSIS — I4819 Other persistent atrial fibrillation: Secondary | ICD-10-CM

## 2022-01-04 DIAGNOSIS — K2971 Gastritis, unspecified, with bleeding: Secondary | ICD-10-CM | POA: Diagnosis not present

## 2022-01-04 DIAGNOSIS — Z9049 Acquired absence of other specified parts of digestive tract: Secondary | ICD-10-CM | POA: Insufficient documentation

## 2022-01-04 DIAGNOSIS — B3781 Candidal esophagitis: Secondary | ICD-10-CM | POA: Insufficient documentation

## 2022-01-04 DIAGNOSIS — I5042 Chronic combined systolic (congestive) and diastolic (congestive) heart failure: Secondary | ICD-10-CM

## 2022-01-04 DIAGNOSIS — D57219 Sickle-cell/Hb-C disease with crisis, unspecified: Secondary | ICD-10-CM | POA: Diagnosis not present

## 2022-01-04 DIAGNOSIS — I509 Heart failure, unspecified: Secondary | ICD-10-CM | POA: Insufficient documentation

## 2022-01-04 DIAGNOSIS — H548 Legal blindness, as defined in USA: Secondary | ICD-10-CM | POA: Insufficient documentation

## 2022-01-04 DIAGNOSIS — D631 Anemia in chronic kidney disease: Secondary | ICD-10-CM | POA: Diagnosis not present

## 2022-01-04 DIAGNOSIS — D57819 Other sickle-cell disorders with crisis, unspecified: Secondary | ICD-10-CM | POA: Diagnosis not present

## 2022-01-04 DIAGNOSIS — Z7951 Long term (current) use of inhaled steroids: Secondary | ICD-10-CM | POA: Diagnosis not present

## 2022-01-04 DIAGNOSIS — N2581 Secondary hyperparathyroidism of renal origin: Secondary | ICD-10-CM | POA: Diagnosis not present

## 2022-01-04 DIAGNOSIS — I4891 Unspecified atrial fibrillation: Secondary | ICD-10-CM | POA: Insufficient documentation

## 2022-01-04 LAB — CBC WITH DIFFERENTIAL/PLATELET
Abs Immature Granulocytes: 0.2 10*3/uL — ABNORMAL HIGH (ref 0.00–0.07)
Basophils Absolute: 0.1 10*3/uL (ref 0.0–0.1)
Basophils Relative: 1 %
Eosinophils Absolute: 0.1 10*3/uL (ref 0.0–0.5)
Eosinophils Relative: 1 %
HCT: 18.8 % — ABNORMAL LOW (ref 39.0–52.0)
Hemoglobin: 7.2 g/dL — ABNORMAL LOW (ref 13.0–17.0)
Immature Granulocytes: 2 %
Lymphocytes Relative: 10 %
Lymphs Abs: 0.7 10*3/uL (ref 0.7–4.0)
MCH: 39.8 pg — ABNORMAL HIGH (ref 26.0–34.0)
MCHC: 38.3 g/dL — ABNORMAL HIGH (ref 30.0–36.0)
MCV: 103.9 fL — ABNORMAL HIGH (ref 80.0–100.0)
Monocytes Absolute: 1.4 10*3/uL — ABNORMAL HIGH (ref 0.1–1.0)
Monocytes Relative: 21 %
Neutro Abs: 4.3 10*3/uL (ref 1.7–7.7)
Neutrophils Relative %: 65 %
Platelets: 142 10*3/uL — ABNORMAL LOW (ref 150–400)
RBC: 1.81 MIL/uL — ABNORMAL LOW (ref 4.22–5.81)
RDW: 19.6 % — ABNORMAL HIGH (ref 11.5–15.5)
WBC: 6.5 10*3/uL (ref 4.0–10.5)
nRBC: 7 % — ABNORMAL HIGH (ref 0.0–0.2)
nRBC: 7 /100 WBC — ABNORMAL HIGH

## 2022-01-04 LAB — RETICULOCYTES
Immature Retic Fract: 44.3 % — ABNORMAL HIGH (ref 2.3–15.9)
RBC.: 1.79 MIL/uL — ABNORMAL LOW (ref 4.22–5.81)
Retic Count, Absolute: 81.8 10*3/uL (ref 19.0–186.0)
Retic Ct Pct: 4.6 % — ABNORMAL HIGH (ref 0.4–3.1)

## 2022-01-04 LAB — COMPREHENSIVE METABOLIC PANEL
ALT: 13 U/L (ref 0–44)
AST: 20 U/L (ref 15–41)
Albumin: 4.1 g/dL (ref 3.5–5.0)
Alkaline Phosphatase: 170 U/L — ABNORMAL HIGH (ref 38–126)
Anion gap: 16 — ABNORMAL HIGH (ref 5–15)
BUN: 50 mg/dL — ABNORMAL HIGH (ref 8–23)
CO2: 26 mmol/L (ref 22–32)
Calcium: 8.4 mg/dL — ABNORMAL LOW (ref 8.9–10.3)
Chloride: 96 mmol/L — ABNORMAL LOW (ref 98–111)
Creatinine, Ser: 5.94 mg/dL — ABNORMAL HIGH (ref 0.61–1.24)
GFR, Estimated: 10 mL/min — ABNORMAL LOW (ref >60)
Glucose, Bld: 115 mg/dL — ABNORMAL HIGH (ref 70–99)
Potassium: 3.5 mmol/L (ref 3.5–5.1)
Sodium: 138 mmol/L (ref 135–145)
Total Bilirubin: 1.8 mg/dL — ABNORMAL HIGH (ref 0.3–1.2)
Total Protein: 8 g/dL (ref 6.5–8.1)

## 2022-01-04 MED ORDER — ONDANSETRON HCL 4 MG/2ML IJ SOLN: 4.0000 mg | Freq: Once | INTRAMUSCULAR | Status: DC

## 2022-01-04 MED ORDER — MORPHINE SULFATE (PF) 4 MG/ML IV SOLN
4.0000 mg | Freq: Once | INTRAVENOUS | Status: AC
  Administered 2022-01-04: 4 mg via INTRAVENOUS
  Filled 2022-01-04: qty 1

## 2022-01-04 MED ORDER — OXYCODONE-ACETAMINOPHEN 5-325 MG PO TABS
1.0000 | ORAL_TABLET | ORAL | Status: AC | PRN
  Administered 2022-01-04 (×2): 1 via ORAL
  Filled 2022-01-04 (×2): qty 1

## 2022-01-04 MED ORDER — ONDANSETRON 4 MG PO TBDP
4.0000 mg | ORAL_TABLET | Freq: Once | ORAL | Status: AC
Start: 2022-01-04 — End: 2022-01-04
  Administered 2022-01-04: 4 mg via ORAL
  Filled 2022-01-04: qty 1

## 2022-01-04 NOTE — Patient Instructions (Signed)
There has been no changes to your medications.  Your physician recommends that you schedule a follow-up appointment in: 4 months (November 2024)   If you have any questions or concerns before your next appointment please send [place] a message through [place] or call our office at [phone].    TO LEAVE A MESSAGE FOR THE NURSE SELECT OPTION 2, PLEASE LEAVE A MESSAGE INCLUDING: YOUR NAME DATE OF BIRTH CALL BACK NUMBER REASON FOR CALL**this is important as we prioritize the call backs  YOU WILL RECEIVE A CALL BACK THE SAME DAY AS LONG AS YOU CALL BEFORE 4:00 PM  At the [facility], you and your health needs are our priority. As part of our continuing mission to provide you with exceptional heart care, we have created designated Provider Care Teams. These Care Teams include your primary Cardiologist (physician) and Advanced Practice Providers (APPs- Physician Assistants and Nurse Practitioners) who all work together to provide you with the care you need, when you need it.   You may see any of the following providers on your designated Care Team at your next follow up: Dr Glori Bickers Dr Haynes Kerns, NP Lyda Jester, [place] [facility] [place], [place] Audry Riles, PharmD   Please be sure to bring in all your medications bottles to every appointment.

## 2022-01-04 NOTE — Discharge Instructions (Addendum)
Your history, exam, work-up today are consistent with sickle cell pain crisis recurrence.  Your labs appeared overall similar to prior and with significant improvement in your pain after the medication we feel you are safe for discharge home.  You agreed.  Please call your sickle cell team tomorrow for further management.  If any symptoms change or worsen acutely, please return to the nearest emergency department.

## 2022-01-04 NOTE — ED Provider Notes (Signed)
[facility] EMERGENCY DEPT Provider Note   CSN: [id number] Arrival date & time: 01/04/22  1729     History  Chief Complaint  Patient presents with   Sickle Cell Pain Crisis    Patrick [name] is a 66 y.o. male.  The history is provided by the patient, medical records and a relative. No language interpreter was used.  Sickle Cell Pain Crisis Location:  Lower extremity and back Severity:  Severe Onset quality:  Gradual Duration:  2 days Similar to previous crisis episodes: yes   Timing:  Constant Progression:  Unchanged Chronicity:  Recurrent Relieved by:  Nothing Worsened by:  Nothing Ineffective treatments:  None tried Associated symptoms: no chest pain, no congestion, no cough, no fatigue, no fever, no headaches, no nausea, no shortness of breath, no swelling of legs, no vomiting and no wheezing        Home Medications Prior to Admission medications   Medication Sig Start Date End Date Taking? Authorizing Provider  acetaminophen (TYLENOL) 500 MG tablet Take 1,000 mg by mouth every 6 (six) hours as needed for moderate pain.    [provider]  allopurinol (ZYLOPRIM) 100 MG tablet TAKE 1 TABLET BY MOUTH EVERY DAY 12/04/21   Dorena Dew, FNP  AURYXIA 1 GM 210 MG(Fe) tablet Take 210 mg by mouth 3 (three) times daily. 02/11/21   [provider]  fluticasone (FLONASE) 50 MCG/ACT nasal spray Place 2 sprays into both nostrils daily. 09/04/21   Bo Merino I, NP  folic acid (FOLVITE) 1 MG tablet TAKE 1 TABLET BY MOUTH EVERY DAY 02/22/21   Dorena Dew, FNP  hydroxyurea (HYDREA) 500 MG capsule Take 1 capsule (500 mg total) by mouth daily. 11/08/21   Dorena Dew, FNP  lidocaine-prilocaine (EMLA) cream Apply 1 application. topically Every Tuesday,Thursday,and Saturday with dialysis. 09/08/19   [provider]  Nutritional Supplements (,FEEDING SUPPLEMENT, PROSOURCE PLUS) liquid Take 30 mLs by mouth 2 (two) times daily  between meals. 08/30/21   Terrilee Croak, MD  Oxycodone HCl 10 MG TABS Take 1 tablet (10 mg total) by mouth every 6 (six) hours as needed. 12/07/21   Dorena Dew, FNP  pantoprazole (PROTONIX) 40 MG tablet TAKE 1 TABLET BY MOUTH TWICE A DAY 12/08/21   Willia Craze, NP  tiZANidine (ZANAFLEX) 4 MG tablet Take 0.5 tablets (2 mg total) by mouth every 8 (eight) hours as needed for muscle spasms. 12/04/21   Dorena Dew, FNP  torsemide (DEMADEX) 20 MG tablet Take 20 mg by mouth in the morning, at noon, in the evening, and at bedtime. 09/03/21   [provider]  Vitamin D, Ergocalciferol, (DRISDOL) 1.25 MG (50000 UNIT) CAPS capsule Take 1 capsule (50,000 Units total) by mouth once a week. Monday 11/08/21   Dorena Dew, FNP      Allergies    Patient has no known allergies.    Review of Systems   Review of Systems  Constitutional:  Negative for chills, fatigue and fever.  HENT:  Negative for congestion.   Respiratory:  Negative for cough, chest tightness, shortness of breath and wheezing.   Cardiovascular:  Negative for chest pain, palpitations and leg swelling.  Gastrointestinal:  Negative for abdominal pain, constipation, diarrhea, nausea and vomiting.  Genitourinary:  Negative for dysuria.  Musculoskeletal:  Negative for back pain, neck pain and neck stiffness.  Skin:  Negative for rash and wound.  Neurological:  Negative for headaches.  Psychiatric/Behavioral:  Negative for agitation.  All other systems reviewed and are negative.   Physical Exam Updated Vital Signs BP 134/79   Pulse (!) 111   Temp 98.4 F (36.9 C) (Oral)   Resp 16   Ht 5\' 11"  (1.803 m)   Wt 59.9 kg   SpO2 95%   BMI 18.41 kg/m  Physical Exam Vitals and nursing note reviewed.  Constitutional:      General: He is not in acute distress.    Appearance: He is well-developed. He is not ill-appearing, toxic-appearing or diaphoretic.  HENT:     Head: Normocephalic and atraumatic.     Nose: No  congestion or rhinorrhea.     Mouth/Throat:     Mouth: Mucous membranes are moist.     Pharynx: No oropharyngeal exudate or posterior oropharyngeal erythema.  Eyes:     Extraocular Movements: Extraocular movements intact.     Conjunctiva/sclera: Conjunctivae normal.     Pupils: Pupils are equal, round, and reactive to light.  Cardiovascular:     Rate and Rhythm: Normal rate and regular rhythm.     Heart sounds: No murmur heard. Pulmonary:     Effort: Pulmonary effort is normal. No respiratory distress.     Breath sounds: Normal breath sounds. No wheezing, rhonchi or rales.  Chest:     Chest wall: No tenderness.  Abdominal:     General: Abdomen is flat.     Palpations: Abdomen is soft.     Tenderness: There is no abdominal tenderness. There is no right CVA tenderness, left CVA tenderness, guarding or rebound.  Musculoskeletal:        General: Tenderness present. No swelling.     Cervical back: Neck supple. No tenderness.     Right lower leg: No edema.     Left lower leg: No edema.  Skin:    General: Skin is warm and dry.     Capillary Refill: Capillary refill takes less than 2 seconds.     Findings: No erythema or rash.  Neurological:     General: No focal deficit present.     Mental Status: He is alert.     Sensory: No sensory deficit.     Motor: No weakness.  Psychiatric:        Mood and Affect: Mood normal.     ED Results / Procedures / Treatments   Labs (all labs ordered are listed, but only abnormal results are displayed) Labs Reviewed  COMPREHENSIVE METABOLIC PANEL - Abnormal; Notable for the following components:      Result Value   Chloride 96 (*)    Glucose, Bld 115 (*)    BUN 50 (*)    Creatinine, Ser 5.94 (*)    Calcium 8.4 (*)    Alkaline Phosphatase 170 (*)    Total Bilirubin 1.8 (*)    GFR, Estimated 10 (*)    Anion gap 16 (*)    All other components within normal limits  CBC WITH DIFFERENTIAL/PLATELET - Abnormal; Notable for the following  components:   RBC 1.81 (*)    Hemoglobin 7.2 (*)    HCT 18.8 (*)    MCV 103.9 (*)    MCH 39.8 (*)    MCHC 38.3 (*)    RDW 19.6 (*)    Platelets 142 (*)    nRBC 7 (*)    Monocytes Absolute 1.4 (*)    nRBC 7 (*)    Abs Immature Granulocytes 0.20 (*)    All other components within normal limits  RETICULOCYTES -  Abnormal; Notable for the following components:   Retic Ct Pct 4.6 (*)    RBC. 1.79 (*)    Immature Retic Fract 44.3 (*)    All other components within normal limits    EKG None  Radiology No results found.  Procedures Procedures    Medications Ordered in ED Medications  oxyCODONE-acetaminophen (PERCOCET/ROXICET) 5-325 MG per tablet 1 tablet (1 tablet Oral Given 01/04/22 2115)  ondansetron (ZOFRAN-ODT) disintegrating tablet 4 mg (4 mg Oral Given 01/04/22 1807)  morphine (PF) 4 MG/ML injection 4 mg (4 mg Intravenous Given 01/04/22 2133)  morphine (PF) 4 MG/ML injection 4 mg (4 mg Intravenous Given 01/04/22 2227)    ED Course/ Medical Decision Making/ A&P                           Medical Decision Making Amount and/or Complexity of Data Reviewed Labs: ordered.  Risk Prescription drug management.    Patrick [name] is a 66 y.o. male with a past medical history significant for ESRD on dialysis TTS, CHF, COPD, sickle cell, and previous atrial fibrillation who presents with pain.  According to patient, he is having his typical sickle cell pain in his legs going to his back that feels unchanged from his baseline.  He reports that symptoms began yesterday and he stopped to control the pain at home.  He reports has been several month since his last pain flare and morphine typically helps him.  He denies any fevers, chills, chest pain, shortness of breath, nausea, vomiting, constipation, or diarrhea.  He says the pain is 10 out of 10 and he is hopeful after the medicine he will be able to go home.  He denies any trauma.  Denies any edema or concern for blood clots.   Denies other complaints.  He reports that he got very cold today while getting dialysis and thinks that may have triggered this crisis worsening.  On exam, lungs clear and chest nontender.  Abdomen nontender.  Back had mild tenderness paraspinally in the back and legs were tender proximally.  Intact sensation, strength, and pulses.  Vital signs she has a mild tachycardia with his pain but were otherwise reassuring.  Patient had some screening labs in triage that appeared similar to prior.  Patient was given a dose of morphine that helped and then the second dose made his pain manageable.  He does not want to be admitted and work-up is similar to prior.  We discussed all of his findings including the anemia that is present and similar.  Patient will call his sickle cell team tomorrow and wants to go home.  Patient discharged in good condition with improved symptoms.           Final Clinical Impression(s) / ED Diagnoses Final diagnoses:  Sickle cell pain crisis ([place])    Rx / DC Orders ED Discharge Orders     None       Clinical Impression: 1. Sickle cell pain crisis ([place])     Disposition: Discharge  Condition: Good  I have discussed the results, Dx and Tx plan with the pt(& family if present). He/she/they expressed understanding and agree(s) with the plan. Discharge instructions discussed at great length. Strict return precautions discussed and pt &/or family have verbalized understanding of the instructions. No further questions at time of discharge.    Discharge Medication List as of 01/04/2022 11:25 PM      Follow Up: [name],  MD [place] [address] [facility] [place] [id number] [phone]         Denzil Bristol, Gwenyth Allegra, MD 01/05/22 [phone]

## 2022-01-04 NOTE — Progress Notes (Addendum)
Patient ID: [name], male   DOB: 14-Mar-1956, 66 y.o.   MRN: [id number]     Advanced Heart Failure Clinic Note   PCP: Patrick Garter, MD Sickle Cell: Patrick Patrick [name]  Opthalmologist: Patrick Patrick [name] HF MD: Patrick Patrick [name] Nephrologist: Patrick Patrick [name].   HPI: Patrick [name] is a 66 y.o. male  with a history of sickle cell anemia, systolic HF due to NICM EF 40%, no CAD by cath 05/2011, ESRD, legally blind, and tobacco abuse.   Admitted 50/27 with A/C systolic HF and sickle cell anemia. Diuresed with IV lasix and transitioned to torsemide 100 mg daily. Found to be back in Afib.. Myoview showed no significant ischemia, EF 46%    Started HD in 05/2019.    Admitted 7/41 for acute systolic HF. He underwent an extra HD session and nephrology reduced his EDW goal. Volume improved.  Echo EF 35-40%, low normal RV function, severe pulm HTN in setting of volume overload with RVSP 26mmHG.    Successful DCCV 4/21 to NSR. Recurrent AFL in 5/21. Referred to AF Clinic. Felt that patient was not good candidate to restart amiodarone. Rate control strategy continued.    Echo 09/21 EF 40-45%, RV mildly reduced, RVSP 75 mmHg, severe TR   Admitted to [facility] in [place] 11/22 with SBO treated with conservative management and choledocholithiasis s/p ERCP stone removal + stents to common bile duct and pancreatic duct. Patient declined cholecystectomy.   Admitted to [facility] on 05/19/21 with recurrent SBO and gangrenous cholecystitis. S/p diagnostic laparoscopy and small bowel resection. Also had GIB, requiring ICU monitoring.  EGD 12/22 showed large amount of blood in the stomach and bleeding AVM. Treated endoscopically, and Eliquis restarted.   Readmitted 1/23 for upper GIB. EGD 1/23 showed Candida esophagitis, and gastritis with hemorrhage treated with APC. Eliquis discontinued this admission. Coreg also stopped with soft BPs.   Patient admitted to [facility] in June 2023 with sickle cell pain crisis and  hyperkalemia after missing an HD session. He was admitted for pain management and discharged with oxycodone.  Patient is here today for f/u. Denies dyspnea, orthopnea, PND or lower extremity edema. He lives alone and reports adherence with medications. His cousin checks on him and assists him periodically. He is legally blind but can see shapes and shadows.   Reports severe pain in both of his legs which began this morning. Feels like pain with sickle cell crisis. States pain had been under good control, but recently ran out of PO pain meds. Gets pain medications through Patrick. Doreene [name]. Denies fever, nausea, vomiting, abdominal pain.    Echos 01/17/12 EF: 55%-60%  11/20/12 EF 40% Peak PA pressure 56 11/30/13 EF 40-45%  12/2015: EF 40%.  07/2017: EF 40-45% 05/2018: EF 25-30% 08/2019: EF 35-40%, low normal RV function, severe pulm HTN. 02/2020: EF 40-45%, RV mildly reduced, RVSP 75 mmHg, severe TR 12/22: EF EF 40-45%, RV mildly reduced, RVSP 55 mmHg, severe MR, severe TR  - Myoview showed no significant ischemia, EF 46%   SH: Lives alone. Uses [employer]. Previously smoked 8 cigarettes per day but now quit. Occasional ETOH and no drugs  FH: Mother deceased: liver failure, no CAD        Father deceased: CAD, MI  Review of systems complete and found to be negative unless listed in HPI.   Past Medical History:  Diagnosis Date   Blood dyscrasia    Blood transfusion    "I've had a bunch of them"   CHF (congestive  heart failure) ([place])    followed by hf clinic   Chronic kidney disease (CKD), stage III (moderate) ([place])    Dialysis patient ([place]) 04/2019   AV fistula (right arm)   Gout    Headache(784.0)    "probably weekly" (01/13/2014)   Legally blind    Shortness of breath dyspnea    Sickle cell disease, type SS (HCC)    Swelling abdomen    Swelling of extremity, left    Swelling of extremity, right    Current Outpatient Medications  Medication Sig Dispense Refill   acetaminophen (TYLENOL) 500 MG  tablet Take 1,000 mg by mouth every 6 (six) hours as needed for moderate pain.     allopurinol (ZYLOPRIM) 100 MG tablet TAKE 1 TABLET BY MOUTH EVERY DAY 30 tablet 1   AURYXIA 1 GM 210 MG(Fe) tablet Take 210 mg by mouth 3 (three) times daily.     fluticasone (FLONASE) 50 MCG/ACT nasal spray Place 2 sprays into both nostrils daily. 16 g 6   folic acid (FOLVITE) 1 MG tablet TAKE 1 TABLET BY MOUTH EVERY DAY 90 tablet 3   hydroxyurea (HYDREA) 500 MG capsule Take 1 capsule (500 mg total) by mouth daily. 90 capsule 1   lidocaine-prilocaine (EMLA) cream Apply 1 application. topically Every Tuesday,Thursday,and Saturday with dialysis.     Nutritional Supplements (,FEEDING SUPPLEMENT, PROSOURCE PLUS) liquid Take 30 mLs by mouth 2 (two) times daily between meals.     Oxycodone HCl 10 MG TABS Take 1 tablet (10 mg total) by mouth every 6 (six) hours as needed. 60 tablet 0   pantoprazole (PROTONIX) 40 MG tablet TAKE 1 TABLET BY MOUTH TWICE A DAY 60 tablet 1   tiZANidine (ZANAFLEX) 4 MG tablet Take 0.5 tablets (2 mg total) by mouth every 8 (eight) hours as needed for muscle spasms. 30 tablet 1   torsemide (DEMADEX) 20 MG tablet Take 20 mg by mouth in the morning, at noon, in the evening, and at bedtime.     Vitamin D, Ergocalciferol, (DRISDOL) 1.25 MG (50000 UNIT) CAPS capsule Take 1 capsule (50,000 Units total) by mouth once a week. Monday 12 capsule 1   No current facility-administered medications for this encounter.   BP 110/70   Pulse 99   Wt 61.4 kg (135 lb 6.4 oz)   SpO2 96%   BMI 18.88 kg/m   Wt Readings from Last 3 Encounters:  01/04/22 61.4 kg (135 lb 6.4 oz)  11/28/21 64.7 kg (142 lb 9.6 oz)  11/23/21 61.9 kg (136 lb 7.4 oz)   PHYSICAL EXAM:  General:  Chronically ill appearing, looks uncomfortable. NAD. Arrived in wheelchair. HEENT: + legally blind Neck: supple. JVP 8 cm with prominent v waves. Carotids 2+ bilat; no bruits.  Cor: PMI nondisplaced. Irregular rate & rhythm. No rubs,  gallops or murmurs. Lungs: clear Abdomen: soft, nontender, nondistended.  Extremities: no cyanosis, clubbing, rash, edema, + RUE AVF Neuro: alert & orientedx3, cranial nerves grossly intact. moves all 4 extremities w/o difficulty. Affect pleasant  ECG: Afib 90 bpm  ASSESSMENT & PLAN:  1. Persistent Atrial Fibrillation/AFL - S/p DCCV 05/2018 - S/P DCCV 10/06/19 NSR - Recurrence 05/21. Has been seen in AF Clinic and rate control strategy recommended. - Not a Watchman candidate with dialysis-dependent ESRD and open wound. - HR 90 today - Off Eliquis (see #3).   2. Chronic combined Systolic/Diastolic Heart Failure:  - NICM. Cath 05/2011 without significant coronary disease.   - Echo 12/2015 40%.  - Echo  05/2018: EF 25-30%.  - Echo 3/21 LVEF 35-40%, low normal RV function,severe pulm HTN. Severe LAE, mod RAE, mod MR, severe TR. RV TAPSE 1.6, RV s' 10 - Echo 9/21 EF 40-45%, RV mildly reduced, mild RAE, severe pulm artery systolic pressure, severe TR - Echo 12/22 suggestive of restrictive CM with EF 40-45%, RV mildly decreased, severe MR, severe TR, RVSP 55 mmHg - Volume status mildly elevated and managed with HD on T,T,S. - Continue torsemide 80 mg daily per Nephrology. - No BP room for hydralazine/imdur - No ARNi/ARB/MRA with ESRD   3. Anemia with H/o GI bleeding: - EGD in 2019 showed nonbleeding gastritis, Colonoscopy showed small internal hemorrhoids, otherwise normal.  - GI bleed 02/22 - gastric erosions on EGD - EGD 12/22 with bleeding AVM in stomach treated with APC and 3 clips, nonbleeding ulcerative - EGD 1/23 + candida esophagitis, gastritis w/ hemorrhage, treated with APC - Continue PPI. - Eliquis discontinued. - He has GI follow up 07/19/21. - CBC followed at dialysis.    4. SBO/gangrenous cholecystitis - s/p lap, cholecystectomy and SB resection 05/24/21.   5. Pulmonary HTN  - Echo 09/21 with EF 40-45%, RV mildly reduced, RVSP 75 mmHg, severe TR - Echo 12/22 with EF  40-45%, RV mildly reduced, severe MR, severe TR, RVSP 55 mmHg   6. ESRD - On HD now T/T/S. - Continue torsemide per Nephrology.   7. HTN: - BP stable  8.  Sickle Cell:   - Follows with Sickle Cell Clinic. - Recent crisis in June - Recurrent, severe pain today. Out of pain medications.  - Offered to assist with transport to ED, he declined. Plans to have cousin take him to ED for evaluation later this evening.   45. Former smoker:   - Quit 11/2015.   Follow-up: 4 months   FINCH, LINDSAY N, PA-C  01/04/2022  Patient seen and examined with the above-signed Advanced Practice Provider and/or Housestaff. I personally reviewed laboratory data, imaging studies and relevant notes. I independently examined the patient and formulated the important aspects of the plan. I have edited the note to reflect any of my changes or salient points. I have personally discussed the plan with the patient and/or family.  Stable from a cardiac perspective. Tolerating HD with relatively good fluid control and stable BP. Main issue is struggling with sickle cell pain   General:  Weak appearing. Blind. No resp difficulty HEENT: normal Neck: supple. JVP 7-8 Carotids 2+ bilat; no bruits. No lymphadenopathy or thryomegaly appreciated. Cor: PMI nondisplaced. Irregular rate & rhythm. 2/6 TR/MR Lungs: clear Abdomen: soft, nontender, mildly distended. No hepatosplenomegaly. No bruits or masses. Good bowel sounds. Extremities: no cyanosis, clubbing, rash, edema Neuro: alert & orientedx3, cranial nerves grossly intact. moves all 4 extremities w/o difficulty. Affect pleasant  Not much else to offer from a cardiac perspective. Volume status controlled with HD. Suggested he go to ER if SS pain is uncontrollable. Remains in rate-controlled AF. Off Eliquis with recurrent GI bleeding. Not good Watchman candidate.   Glori Bickers, MD  4:02 PM

## 2022-01-04 NOTE — ED Triage Notes (Signed)
Pt states he is having a sickle cell crisis, out of pain meds and states he is having bilateral lower leg pain & back pain

## 2022-01-05 ENCOUNTER — Telehealth: Payer: Self-pay

## 2022-01-05 NOTE — Telephone Encounter (Signed)
Oxycodone  °

## 2022-01-08 ENCOUNTER — Telehealth: Payer: Self-pay

## 2022-01-08 ENCOUNTER — Telehealth: Payer: Self-pay | Admitting: Internal Medicine

## 2022-01-08 NOTE — Telephone Encounter (Signed)
Pt lvm on nurse line 7/21 requesting refill on Oxycodone.

## 2022-01-08 NOTE — Telephone Encounter (Signed)
Oxycodone  Folic acid

## 2022-01-09 ENCOUNTER — Other Ambulatory Visit: Payer: Self-pay | Admitting: Family Medicine

## 2022-01-09 DIAGNOSIS — N186 End stage renal disease: Secondary | ICD-10-CM | POA: Diagnosis not present

## 2022-01-09 DIAGNOSIS — D57819 Other sickle-cell disorders with crisis, unspecified: Secondary | ICD-10-CM | POA: Diagnosis not present

## 2022-01-09 DIAGNOSIS — D571 Sickle-cell disease without crisis: Secondary | ICD-10-CM

## 2022-01-09 DIAGNOSIS — N2581 Secondary hyperparathyroidism of renal origin: Secondary | ICD-10-CM | POA: Diagnosis not present

## 2022-01-09 DIAGNOSIS — Z992 Dependence on renal dialysis: Secondary | ICD-10-CM | POA: Diagnosis not present

## 2022-01-09 DIAGNOSIS — D5700 Hb-SS disease with crisis, unspecified: Secondary | ICD-10-CM | POA: Diagnosis not present

## 2022-01-09 DIAGNOSIS — D57219 Sickle-cell/Hb-C disease with crisis, unspecified: Secondary | ICD-10-CM | POA: Diagnosis not present

## 2022-01-09 DIAGNOSIS — D509 Iron deficiency anemia, unspecified: Secondary | ICD-10-CM | POA: Diagnosis not present

## 2022-01-09 DIAGNOSIS — D631 Anemia in chronic kidney disease: Secondary | ICD-10-CM | POA: Diagnosis not present

## 2022-01-09 MED ORDER — OXYCODONE HCL 10 MG PO TABS: 10.0000 mg | ORAL_TABLET | Freq: Four times a day (QID) | ORAL | 0 refills | Status: DC | PRN

## 2022-01-09 NOTE — Progress Notes (Signed)
Reviewed PDMP substance reporting system prior to prescribing opiate medications. No inconsistencies noted.  Meds ordered this encounter  Medications   Oxycodone HCl 10 MG TABS    Sig: Take 1 tablet (10 mg total) by mouth every 6 (six) hours as needed.    Dispense:  60 tablet    Refill:  0    Order Specific Question:   Supervising Provider    Answer:   JEGEDE, OLUGBEMIGA E [[id number]]   Patrick Granados Moore Maxine Fredman  APRN, MSN, FNP-C [facility] [address]  [place], [place] [address] [phone]  

## 2022-01-10 ENCOUNTER — Encounter (HOSPITAL_COMMUNITY): Payer: Self-pay

## 2022-01-12 ENCOUNTER — Telehealth: Payer: Self-pay | Admitting: Internal Medicine

## 2022-01-12 NOTE — Telephone Encounter (Signed)
Patient requesting a note to return to work on 01/22/2022 without prolonged standing. Patient requested to pick up the note when notified that it is ready. Stated it was discussed at last visit.

## 2022-01-13 ENCOUNTER — Encounter: Payer: Self-pay | Admitting: Family Medicine

## 2022-01-13 ENCOUNTER — Other Ambulatory Visit: Payer: Self-pay | Admitting: Family Medicine

## 2022-01-13 ENCOUNTER — Other Ambulatory Visit: Payer: Self-pay | Admitting: Nurse Practitioner

## 2022-01-15 DIAGNOSIS — N186 End stage renal disease: Secondary | ICD-10-CM | POA: Diagnosis not present

## 2022-01-15 DIAGNOSIS — I158 Other secondary hypertension: Secondary | ICD-10-CM | POA: Diagnosis not present

## 2022-01-15 DIAGNOSIS — Z992 Dependence on renal dialysis: Secondary | ICD-10-CM | POA: Diagnosis not present

## 2022-01-16 DIAGNOSIS — D631 Anemia in chronic kidney disease: Secondary | ICD-10-CM | POA: Diagnosis not present

## 2022-01-16 DIAGNOSIS — D57219 Sickle-cell/Hb-C disease with crisis, unspecified: Secondary | ICD-10-CM | POA: Diagnosis not present

## 2022-01-16 DIAGNOSIS — D509 Iron deficiency anemia, unspecified: Secondary | ICD-10-CM | POA: Diagnosis not present

## 2022-01-16 DIAGNOSIS — D5700 Hb-SS disease with crisis, unspecified: Secondary | ICD-10-CM | POA: Diagnosis not present

## 2022-01-16 DIAGNOSIS — Z992 Dependence on renal dialysis: Secondary | ICD-10-CM | POA: Diagnosis not present

## 2022-01-16 DIAGNOSIS — N186 End stage renal disease: Secondary | ICD-10-CM | POA: Diagnosis not present

## 2022-01-16 DIAGNOSIS — D571 Sickle-cell disease without crisis: Secondary | ICD-10-CM | POA: Diagnosis not present

## 2022-01-16 DIAGNOSIS — N2581 Secondary hyperparathyroidism of renal origin: Secondary | ICD-10-CM | POA: Diagnosis not present

## 2022-01-16 DIAGNOSIS — D57819 Other sickle-cell disorders with crisis, unspecified: Secondary | ICD-10-CM | POA: Diagnosis not present

## 2022-01-21 ENCOUNTER — Other Ambulatory Visit: Payer: Self-pay | Admitting: Family Medicine

## 2022-01-21 DIAGNOSIS — M545 Low back pain, unspecified: Secondary | ICD-10-CM

## 2022-01-23 DIAGNOSIS — D57819 Other sickle-cell disorders with crisis, unspecified: Secondary | ICD-10-CM | POA: Diagnosis not present

## 2022-01-23 DIAGNOSIS — Z992 Dependence on renal dialysis: Secondary | ICD-10-CM | POA: Diagnosis not present

## 2022-01-23 DIAGNOSIS — N2581 Secondary hyperparathyroidism of renal origin: Secondary | ICD-10-CM | POA: Diagnosis not present

## 2022-01-23 DIAGNOSIS — D509 Iron deficiency anemia, unspecified: Secondary | ICD-10-CM | POA: Diagnosis not present

## 2022-01-23 DIAGNOSIS — D57219 Sickle-cell/Hb-C disease with crisis, unspecified: Secondary | ICD-10-CM | POA: Diagnosis not present

## 2022-01-23 DIAGNOSIS — D571 Sickle-cell disease without crisis: Secondary | ICD-10-CM | POA: Diagnosis not present

## 2022-01-23 DIAGNOSIS — N186 End stage renal disease: Secondary | ICD-10-CM | POA: Diagnosis not present

## 2022-01-23 DIAGNOSIS — D5700 Hb-SS disease with crisis, unspecified: Secondary | ICD-10-CM | POA: Diagnosis not present

## 2022-01-23 DIAGNOSIS — D631 Anemia in chronic kidney disease: Secondary | ICD-10-CM | POA: Diagnosis not present

## 2022-01-30 DIAGNOSIS — D57219 Sickle-cell/Hb-C disease with crisis, unspecified: Secondary | ICD-10-CM | POA: Diagnosis not present

## 2022-01-30 DIAGNOSIS — D571 Sickle-cell disease without crisis: Secondary | ICD-10-CM | POA: Diagnosis not present

## 2022-01-30 DIAGNOSIS — D5700 Hb-SS disease with crisis, unspecified: Secondary | ICD-10-CM | POA: Diagnosis not present

## 2022-01-30 DIAGNOSIS — D57819 Other sickle-cell disorders with crisis, unspecified: Secondary | ICD-10-CM | POA: Diagnosis not present

## 2022-01-30 DIAGNOSIS — N2581 Secondary hyperparathyroidism of renal origin: Secondary | ICD-10-CM | POA: Diagnosis not present

## 2022-01-30 DIAGNOSIS — Z992 Dependence on renal dialysis: Secondary | ICD-10-CM | POA: Diagnosis not present

## 2022-01-30 DIAGNOSIS — N186 End stage renal disease: Secondary | ICD-10-CM | POA: Diagnosis not present

## 2022-02-05 ENCOUNTER — Telehealth: Payer: Self-pay

## 2022-02-05 NOTE — Telephone Encounter (Signed)
Oxycodone  °

## 2022-02-06 DIAGNOSIS — Z992 Dependence on renal dialysis: Secondary | ICD-10-CM | POA: Diagnosis not present

## 2022-02-06 DIAGNOSIS — D571 Sickle-cell disease without crisis: Secondary | ICD-10-CM | POA: Diagnosis not present

## 2022-02-06 DIAGNOSIS — N2581 Secondary hyperparathyroidism of renal origin: Secondary | ICD-10-CM | POA: Diagnosis not present

## 2022-02-06 DIAGNOSIS — D5700 Hb-SS disease with crisis, unspecified: Secondary | ICD-10-CM | POA: Diagnosis not present

## 2022-02-06 DIAGNOSIS — N186 End stage renal disease: Secondary | ICD-10-CM | POA: Diagnosis not present

## 2022-02-06 DIAGNOSIS — D631 Anemia in chronic kidney disease: Secondary | ICD-10-CM | POA: Diagnosis not present

## 2022-02-06 DIAGNOSIS — D57819 Other sickle-cell disorders with crisis, unspecified: Secondary | ICD-10-CM | POA: Diagnosis not present

## 2022-02-06 DIAGNOSIS — D57219 Sickle-cell/Hb-C disease with crisis, unspecified: Secondary | ICD-10-CM | POA: Diagnosis not present

## 2022-02-08 ENCOUNTER — Other Ambulatory Visit: Payer: Self-pay | Admitting: Family Medicine

## 2022-02-09 ENCOUNTER — Telehealth: Payer: Self-pay

## 2022-02-09 ENCOUNTER — Telehealth: Payer: Self-pay | Admitting: Internal Medicine

## 2022-02-09 ENCOUNTER — Other Ambulatory Visit: Payer: Self-pay | Admitting: Internal Medicine

## 2022-02-09 DIAGNOSIS — D571 Sickle-cell disease without crisis: Secondary | ICD-10-CM

## 2022-02-09 MED ORDER — OXYCODONE HCL 10 MG PO TABS: 10.0000 mg | ORAL_TABLET | Freq: Four times a day (QID) | ORAL | 0 refills | Status: DC | PRN

## 2022-02-09 NOTE — Telephone Encounter (Signed)
Oxycodone  °

## 2022-02-09 NOTE — Telephone Encounter (Signed)
Patient LVM - requests refill for Oxycodone 10mg  be sent to [facility] on [address]. Pt states he is experiencing back pain from dialysis.

## 2022-02-13 DIAGNOSIS — D571 Sickle-cell disease without crisis: Secondary | ICD-10-CM | POA: Diagnosis not present

## 2022-02-13 DIAGNOSIS — D57819 Other sickle-cell disorders with crisis, unspecified: Secondary | ICD-10-CM | POA: Diagnosis not present

## 2022-02-13 DIAGNOSIS — Z992 Dependence on renal dialysis: Secondary | ICD-10-CM | POA: Diagnosis not present

## 2022-02-13 DIAGNOSIS — D509 Iron deficiency anemia, unspecified: Secondary | ICD-10-CM | POA: Diagnosis not present

## 2022-02-13 DIAGNOSIS — D631 Anemia in chronic kidney disease: Secondary | ICD-10-CM | POA: Diagnosis not present

## 2022-02-13 DIAGNOSIS — D57219 Sickle-cell/Hb-C disease with crisis, unspecified: Secondary | ICD-10-CM | POA: Diagnosis not present

## 2022-02-13 DIAGNOSIS — N186 End stage renal disease: Secondary | ICD-10-CM | POA: Diagnosis not present

## 2022-02-13 DIAGNOSIS — D5700 Hb-SS disease with crisis, unspecified: Secondary | ICD-10-CM | POA: Diagnosis not present

## 2022-02-13 DIAGNOSIS — N2581 Secondary hyperparathyroidism of renal origin: Secondary | ICD-10-CM | POA: Diagnosis not present

## 2022-02-15 DIAGNOSIS — I158 Other secondary hypertension: Secondary | ICD-10-CM | POA: Diagnosis not present

## 2022-02-15 DIAGNOSIS — N186 End stage renal disease: Secondary | ICD-10-CM | POA: Diagnosis not present

## 2022-02-15 DIAGNOSIS — Z992 Dependence on renal dialysis: Secondary | ICD-10-CM | POA: Diagnosis not present

## 2022-02-17 DIAGNOSIS — D571 Sickle-cell disease without crisis: Secondary | ICD-10-CM | POA: Diagnosis not present

## 2022-02-17 DIAGNOSIS — D57219 Sickle-cell/Hb-C disease with crisis, unspecified: Secondary | ICD-10-CM | POA: Diagnosis not present

## 2022-02-17 DIAGNOSIS — D57819 Other sickle-cell disorders with crisis, unspecified: Secondary | ICD-10-CM | POA: Diagnosis not present

## 2022-02-17 DIAGNOSIS — Z992 Dependence on renal dialysis: Secondary | ICD-10-CM | POA: Diagnosis not present

## 2022-02-17 DIAGNOSIS — N2581 Secondary hyperparathyroidism of renal origin: Secondary | ICD-10-CM | POA: Diagnosis not present

## 2022-02-17 DIAGNOSIS — N186 End stage renal disease: Secondary | ICD-10-CM | POA: Diagnosis not present

## 2022-02-17 DIAGNOSIS — D5700 Hb-SS disease with crisis, unspecified: Secondary | ICD-10-CM | POA: Diagnosis not present

## 2022-02-20 ENCOUNTER — Encounter (HOSPITAL_COMMUNITY): Payer: Self-pay

## 2022-02-20 DIAGNOSIS — D57819 Other sickle-cell disorders with crisis, unspecified: Secondary | ICD-10-CM | POA: Diagnosis not present

## 2022-02-20 DIAGNOSIS — N2581 Secondary hyperparathyroidism of renal origin: Secondary | ICD-10-CM | POA: Diagnosis not present

## 2022-02-20 DIAGNOSIS — D571 Sickle-cell disease without crisis: Secondary | ICD-10-CM | POA: Diagnosis not present

## 2022-02-20 DIAGNOSIS — Z992 Dependence on renal dialysis: Secondary | ICD-10-CM | POA: Diagnosis not present

## 2022-02-20 DIAGNOSIS — D631 Anemia in chronic kidney disease: Secondary | ICD-10-CM | POA: Diagnosis not present

## 2022-02-20 DIAGNOSIS — D57219 Sickle-cell/Hb-C disease with crisis, unspecified: Secondary | ICD-10-CM | POA: Diagnosis not present

## 2022-02-20 DIAGNOSIS — D5700 Hb-SS disease with crisis, unspecified: Secondary | ICD-10-CM | POA: Diagnosis not present

## 2022-02-20 DIAGNOSIS — D509 Iron deficiency anemia, unspecified: Secondary | ICD-10-CM | POA: Diagnosis not present

## 2022-02-20 DIAGNOSIS — N186 End stage renal disease: Secondary | ICD-10-CM | POA: Diagnosis not present

## 2022-02-27 ENCOUNTER — Ambulatory Visit: Payer: HMO | Admitting: Family Medicine

## 2022-02-27 DIAGNOSIS — Z992 Dependence on renal dialysis: Secondary | ICD-10-CM | POA: Diagnosis not present

## 2022-02-27 DIAGNOSIS — D57819 Other sickle-cell disorders with crisis, unspecified: Secondary | ICD-10-CM | POA: Diagnosis not present

## 2022-02-27 DIAGNOSIS — D5700 Hb-SS disease with crisis, unspecified: Secondary | ICD-10-CM | POA: Diagnosis not present

## 2022-02-27 DIAGNOSIS — D57219 Sickle-cell/Hb-C disease with crisis, unspecified: Secondary | ICD-10-CM | POA: Diagnosis not present

## 2022-02-27 DIAGNOSIS — N186 End stage renal disease: Secondary | ICD-10-CM | POA: Diagnosis not present

## 2022-02-27 DIAGNOSIS — D571 Sickle-cell disease without crisis: Secondary | ICD-10-CM | POA: Diagnosis not present

## 2022-02-27 DIAGNOSIS — N2581 Secondary hyperparathyroidism of renal origin: Secondary | ICD-10-CM | POA: Diagnosis not present

## 2022-02-27 DIAGNOSIS — D509 Iron deficiency anemia, unspecified: Secondary | ICD-10-CM | POA: Diagnosis not present

## 2022-03-05 ENCOUNTER — Other Ambulatory Visit: Payer: Self-pay | Admitting: Nurse Practitioner

## 2022-03-05 ENCOUNTER — Telehealth: Payer: Self-pay

## 2022-03-05 NOTE — Telephone Encounter (Signed)
Oxycodone  °

## 2022-03-06 ENCOUNTER — Other Ambulatory Visit: Payer: Self-pay | Admitting: Family Medicine

## 2022-03-06 DIAGNOSIS — Z992 Dependence on renal dialysis: Secondary | ICD-10-CM | POA: Diagnosis not present

## 2022-03-06 DIAGNOSIS — D509 Iron deficiency anemia, unspecified: Secondary | ICD-10-CM | POA: Diagnosis not present

## 2022-03-06 DIAGNOSIS — D631 Anemia in chronic kidney disease: Secondary | ICD-10-CM | POA: Diagnosis not present

## 2022-03-06 DIAGNOSIS — D571 Sickle-cell disease without crisis: Secondary | ICD-10-CM

## 2022-03-06 DIAGNOSIS — D57819 Other sickle-cell disorders with crisis, unspecified: Secondary | ICD-10-CM | POA: Diagnosis not present

## 2022-03-06 DIAGNOSIS — D57219 Sickle-cell/Hb-C disease with crisis, unspecified: Secondary | ICD-10-CM | POA: Diagnosis not present

## 2022-03-06 DIAGNOSIS — N186 End stage renal disease: Secondary | ICD-10-CM | POA: Diagnosis not present

## 2022-03-06 DIAGNOSIS — D5700 Hb-SS disease with crisis, unspecified: Secondary | ICD-10-CM | POA: Diagnosis not present

## 2022-03-06 DIAGNOSIS — N2581 Secondary hyperparathyroidism of renal origin: Secondary | ICD-10-CM | POA: Diagnosis not present

## 2022-03-06 MED ORDER — OXYCODONE HCL 10 MG PO TABS: 10.0000 mg | ORAL_TABLET | Freq: Four times a day (QID) | ORAL | 0 refills | Status: DC | PRN

## 2022-03-06 NOTE — Progress Notes (Signed)
Reviewed PDMP substance reporting system prior to prescribing opiate medications. No inconsistencies noted.  Meds ordered this encounter  Medications   Oxycodone HCl 10 MG TABS    Sig: Take 1 tablet (10 mg total) by mouth every 6 (six) hours as needed.    Dispense:  60 tablet    Refill:  0    Order Specific Question:   Supervising Provider    Answer:   JEGEDE, OLUGBEMIGA E [[id number]]   Tymia Streb Moore Lisa Blakeman  APRN, MSN, FNP-C [facility] [address]  [place], [place] [address] [phone]  

## 2022-03-08 ENCOUNTER — Telehealth: Payer: Self-pay

## 2022-03-08 NOTE — Telephone Encounter (Signed)
Oxycodone  Back pain med

## 2022-03-09 ENCOUNTER — Other Ambulatory Visit: Payer: Self-pay

## 2022-03-09 ENCOUNTER — Other Ambulatory Visit: Payer: Self-pay | Admitting: Family Medicine

## 2022-03-09 DIAGNOSIS — M545 Low back pain, unspecified: Secondary | ICD-10-CM

## 2022-03-09 MED ORDER — TIZANIDINE HCL 4 MG PO TABS: ORAL_TABLET | ORAL | 1 refills | Status: DC

## 2022-03-13 DIAGNOSIS — D57219 Sickle-cell/Hb-C disease with crisis, unspecified: Secondary | ICD-10-CM | POA: Diagnosis not present

## 2022-03-13 DIAGNOSIS — Z992 Dependence on renal dialysis: Secondary | ICD-10-CM | POA: Diagnosis not present

## 2022-03-13 DIAGNOSIS — D5700 Hb-SS disease with crisis, unspecified: Secondary | ICD-10-CM | POA: Diagnosis not present

## 2022-03-13 DIAGNOSIS — D631 Anemia in chronic kidney disease: Secondary | ICD-10-CM | POA: Diagnosis not present

## 2022-03-13 DIAGNOSIS — N186 End stage renal disease: Secondary | ICD-10-CM | POA: Diagnosis not present

## 2022-03-13 DIAGNOSIS — D509 Iron deficiency anemia, unspecified: Secondary | ICD-10-CM | POA: Diagnosis not present

## 2022-03-13 DIAGNOSIS — D57819 Other sickle-cell disorders with crisis, unspecified: Secondary | ICD-10-CM | POA: Diagnosis not present

## 2022-03-13 DIAGNOSIS — D571 Sickle-cell disease without crisis: Secondary | ICD-10-CM | POA: Diagnosis not present

## 2022-03-13 DIAGNOSIS — N2581 Secondary hyperparathyroidism of renal origin: Secondary | ICD-10-CM | POA: Diagnosis not present

## 2022-03-15 ENCOUNTER — Encounter: Payer: Self-pay | Admitting: Vascular Surgery

## 2022-03-15 ENCOUNTER — Other Ambulatory Visit: Payer: Self-pay

## 2022-03-15 ENCOUNTER — Ambulatory Visit (INDEPENDENT_AMBULATORY_CARE_PROVIDER_SITE_OTHER): Payer: HMO | Admitting: Vascular Surgery

## 2022-03-15 VITALS — BP 110/70 | HR 90 | Temp 98.0°F | Resp 20 | Ht 71.0 in | Wt 132.0 lb

## 2022-03-15 DIAGNOSIS — N186 End stage renal disease: Secondary | ICD-10-CM | POA: Diagnosis not present

## 2022-03-15 DIAGNOSIS — Z992 Dependence on renal dialysis: Secondary | ICD-10-CM | POA: Diagnosis not present

## 2022-03-15 NOTE — Progress Notes (Signed)
REASON FOR VISIT:   Ulcerations overlying right upper arm fistula.  The consult is requested by Dr. Moshe Cipro.  MEDICAL ISSUES:   ULCERATIONS OVERLYING RIGHT AV FISTULA: This patient has 2 ulcerations adjacent to his right upper arm fistula.  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  I would recommend simply excising the areas of concern and closing the skin over the fistula without plicating the aneurysm.  Entire fistula is aneurysmal as documented in the photograph below.  I have instructed him to tell the dialysis nurses not to cannulate in these areas.  He dialyzes on Tuesdays Thursdays and Saturdays we will schedule this on a nondialysis day.  He was unable to proceed tomorrow so we will schedule him for Wednesday of next week as this works better with his work schedule.  HPI:   Patrick [name] is a pleasant 66 y.o. male who was referred with an ulceration overlying his AV fistula.  I last saw him in February 2022 when he had excision of an aneurysm of his right arm fistula.  This was in the central portion of the right upper arm fistula.  We have not seen him in follow-up since that time.  He was referred back because of some areas of concern overlying his fistula.  He states that these have been oozing some for the last several weeks and therefore he was set up for evaluation.  He states that otherwise the fistula has been working well.  He dialyzes on Tuesdays Thursdays and Saturdays.  Past Medical History:  Diagnosis Date   Blood dyscrasia    Blood transfusion    "I've had a bunch of them"   CHF (congestive heart failure) (HCC)    followed by hf clinic   Chronic kidney disease (CKD), stage III (moderate) (HCC)    Dialysis patient ([place]) 04/2019   AV fistula (right arm)   Gout    Headache(784.0)    "probably weekly" (01/13/2014)   Legally blind    Shortness of breath dyspnea    Sickle cell disease, type SS (HCC)    Swelling abdomen    Swelling of extremity, left     Swelling of extremity, right     Family History  Problem Relation Age of Onset   Alcohol abuse Mother    Liver disease Mother    Cirrhosis Mother    Heart attack Mother     SOCIAL HISTORY: Social History   Tobacco Use   Smoking status: Former    Packs/day: 0.50    Years: 41.00    Total pack years: 20.50    Types: Cigarettes    Quit date: 12/08/2015    Years since quitting: 6.2   Smokeless tobacco: Never  Substance Use Topics   Alcohol use: Not Currently    Alcohol/week: 3.0 standard drinks of alcohol    Types: 1 Glasses of wine, 2 Cans of beer per week    Comment: occasional    No Known Allergies  Current Outpatient Medications  Medication Sig Dispense Refill   acetaminophen (TYLENOL) 500 MG tablet Take 1,000 mg by mouth every 6 (six) hours as needed for moderate pain.     allopurinol (ZYLOPRIM) 100 MG tablet TAKE 1 TABLET BY MOUTH EVERY DAY 30 tablet 1   AURYXIA 1 GM 210 MG(Fe) tablet Take 210 mg by mouth 3 (three) times daily.     fluticasone (FLONASE) 50 MCG/ACT nasal spray Place 2 sprays into both nostrils daily. 16 g 6   folic acid (  FOLVITE) 1 MG tablet TAKE 1 TABLET BY MOUTH EVERY DAY 90 tablet 3   hydroxyurea (HYDREA) 500 MG capsule Take 1 capsule (500 mg total) by mouth daily. 90 capsule 1   lidocaine-prilocaine (EMLA) cream Apply 1 application. topically Every Tuesday,Thursday,and Saturday with dialysis.     Nutritional Supplements (,FEEDING SUPPLEMENT, PROSOURCE PLUS) liquid Take 30 mLs by mouth 2 (two) times daily between meals.     Oxycodone HCl 10 MG TABS Take 1 tablet (10 mg total) by mouth every 6 (six) hours as needed. 60 tablet 0   pantoprazole (PROTONIX) 40 MG tablet TAKE 1 TABLET BY MOUTH TWICE A DAY 60 tablet 3   tiZANidine (ZANAFLEX) 4 MG tablet TAKE 1/2 TABLET (2MG  TOTAL) BY MOUTH EVERY 8 HOURS AS NEEDED FOR MUSCLE SPASM 30 tablet 1   torsemide (DEMADEX) 20 MG tablet Take 20 mg by mouth in the morning, at noon, in the evening, and at bedtime.      Vitamin D, Ergocalciferol, (DRISDOL) 1.25 MG (50000 UNIT) CAPS capsule Take 1 capsule (50,000 Units total) by mouth once a week. Monday 12 capsule 1   No current facility-administered medications for this visit.    REVIEW OF SYSTEMS:  [X]  denotes positive finding, [ ]  denotes negative finding Cardiac  Comments:  Chest pain or chest pressure:    Shortness of breath upon exertion:    Short of breath when lying flat:    Irregular heart rhythm:        Vascular    Pain in calf, thigh, or hip brought on by ambulation:    Pain in feet at night that wakes you up from your sleep:     Blood clot in your veins:    Leg swelling:         Pulmonary    Oxygen at home:    Productive cough:     Wheezing:         Neurologic    Sudden weakness in arms or legs:     Sudden numbness in arms or legs:     Sudden onset of difficulty speaking or slurred speech:    Temporary loss of vision in one eye:     Problems with dizziness:         Gastrointestinal    Blood in stool:     Vomited blood:         Genitourinary    Burning when urinating:     Blood in urine:        Psychiatric    Major depression:         Hematologic    Bleeding problems:    Problems with blood clotting too easily:        Skin    Rashes or ulcers:        Constitutional    Fever or chills:     PHYSICAL EXAM:   Vitals:   03/15/22 1332  BP: 110/70  Pulse: 90  Resp: 20  Temp: 98 F (36.7 C)  Weight: 132 lb (59.9 kg)  Height: 5\' 11"  (1.803 m)    GENERAL: The patient is a well-nourished male, in no acute distress. The vital signs are documented above. CARDIAC: There is a regular rate and rhythm.  VASCULAR: His right upper arm fistula is aneurysmal.  It is slightly pulsatile but otherwise has a good thrill.  There are 2 areas where the skin is thinned out as documented in the photograph below.   PULMONARY: There is good air exchange  bilaterally without wheezing or rales.  MUSCULOSKELETAL: There are no major  deformities or cyanosis. NEUROLOGIC: No focal weakness or paresthesias are detected. PSYCHIATRIC: The patient has a normal affect.  DATA:    No new data.  Deitra Mayo [facility] [phone]

## 2022-03-17 DIAGNOSIS — Z992 Dependence on renal dialysis: Secondary | ICD-10-CM | POA: Diagnosis not present

## 2022-03-17 DIAGNOSIS — N186 End stage renal disease: Secondary | ICD-10-CM | POA: Diagnosis not present

## 2022-03-17 DIAGNOSIS — I158 Other secondary hypertension: Secondary | ICD-10-CM | POA: Diagnosis not present

## 2022-03-18 ENCOUNTER — Other Ambulatory Visit: Payer: Self-pay | Admitting: Family Medicine

## 2022-03-18 ENCOUNTER — Telehealth: Payer: Self-pay | Admitting: Physician Assistant

## 2022-03-18 DIAGNOSIS — M545 Low back pain, unspecified: Secondary | ICD-10-CM

## 2022-03-18 NOTE — Telephone Encounter (Signed)
   The patient called the answering service after-hours today. Requesting refill of allopurinol. We do not prescribe this medicine for him. I recommended he contact his PCP for review. The patient verbalized understanding and gratitude.  Charlie Pitter, PA-C

## 2022-03-20 ENCOUNTER — Other Ambulatory Visit: Payer: Self-pay

## 2022-03-20 ENCOUNTER — Encounter (HOSPITAL_COMMUNITY): Payer: Self-pay | Admitting: Vascular Surgery

## 2022-03-20 NOTE — Progress Notes (Signed)
Cardiologist - Dr. Charlett Blake  Internal Med/PCP - Dr Angelica Chessman  Nephrology -  Dr. Moshe Cipro.  Chest x-ray - 11/21/21 EKG - 01/04/22 Stress Test - 06/06/18 ECHO - 05/23/21 Cardiac Cath - 06/11/11  ICD Pacemaker/Loop - none  Sleep Study -  n/a CPAP - none  Anesthesia review: Yes  STOP now taking any Aspirin (unless otherwise instructed by your surgeon), Aleve, Naproxen, Ibuprofen, Motrin, Advil, Goody's, BC's, all herbal medications, fish oil, and all vitamins.   Coronavirus Screening Do you have any of the following symptoms:  Cough yes/no: No Fever (>100.18F)  yes/no: No Runny nose yes/no: No Sore throat Yes Difficulty breathing/shortness of breath  yes/no: No  Have you traveled in the last 14 days and where? yes/no: No  Patient verbalized understanding of instructions that were given via phone.

## 2022-03-20 NOTE — Anesthesia Preprocedure Evaluation (Signed)
Anesthesia Evaluation  Patient identified by MRN, date of birth, ID band Patient awake    Reviewed: Allergy & Precautions, NPO status , Patient's Chart, lab work & pertinent test results, reviewed documented beta blocker date and time   History of Anesthesia Complications Negative for: history of anesthetic complications  Airway Mallampati: II  TM Distance: >3 FB Neck ROM: Full    Dental  (+) Dental Advisory Given, Poor Dentition, Missing   Pulmonary COPD, former smoker   Pulmonary exam normal breath sounds clear to auscultation       Cardiovascular hypertension, +CHF  + dysrhythmias Atrial Fibrillation + Valvular Problems/Murmurs MR  Rhythm:Irregular Rate:Abnormal + Systolic murmurs Echo 50/3546: EF 40-45%, RV mildly reduced, severe MR, severe TR, RVSP 55 mmHg   Neuro/Psych  Headaches  negative psych ROS   GI/Hepatic Neg liver ROS,GERD  Medicated,,  Endo/Other  negative endocrine ROS    Renal/GU ESRF and DialysisRenal disease     Musculoskeletal negative musculoskeletal ROS (+)    Abdominal   Peds  Hematology  (+) Blood dyscrasia (Eliquis; Thrombocytopenia), Sickle cell anemia and anemia   Anesthesia Other Findings   Reproductive/Obstetrics                           Anesthesia Physical Anesthesia Plan  ASA: 4  Anesthesia Plan: General   Post-op Pain Management: Tylenol PO (pre-op)*   Induction: Intravenous  PONV Risk Score and Plan: 2 and Dexamethasone, Ondansetron and Midazolam  Airway Management Planned: LMA  Additional Equipment:   Intra-op Plan:   Post-operative Plan: Extubation in OR  Informed Consent: I have reviewed the patients History and Physical, chart, labs and discussed the procedure including the risks, benefits and alternatives for the proposed anesthesia with the patient or authorized representative who has indicated his/her understanding and acceptance.      Dental advisory given  Plan Discussed with: CRNA  Anesthesia Plan Comments: (PAT note written 03/20/2022 by Myra Gianotti, PA-C. History ESRD, Sickle Cell, NICM, afib. pulm HTN, severe MR/TR. )        Anesthesia Quick Evaluation

## 2022-03-20 NOTE — Progress Notes (Signed)
Anesthesia Chart Review:SAME DAY WORK-UP  Case: [id number] Date/Time: 03/21/22 1246   Procedure: EXCISION OF ULCERS RIGHT ARTERIOVENOUS FISTULA (Right)   Anesthesia type: Choice   Pre-op diagnosis: ESRD   Location: [facility] OR ROOM 12 / [facility]   Surgeons: Marty Heck, MD       DISCUSSION: Patient is a 67 year old male scheduled for the above procedure.   History includes former smoker (quit 11/2015), ESRD (HD TTS, started 05/2019), Sickle Cell disease type SS (last ED visit for SS crisis 01/04/22), non-ischemic cardiomyopathy, chronic combined systolic and diastolic CHF, afib (s/p DCCV 05/2018; 10/06/19 w/ recurrence 10/20/19; off Eliquis due to recurrent GI bleed), pulmonary hypertension (Echo 05/2021: EF 40-45%, RV mildly reduced, severe MR, severe TR, RVSP 55 mmHg), legally blind, chronic small bowel ischemic with gangrenous cholecystitis (s/p small bowel resection w/ cholecystectomy 05/24/21), GI bleed (gastric & duodenum angiectasia, last APC 08/10/21).   Last HF cardiology visit with Dr. Haroldine Laws was on 01/04/22.  Overall stable from cardiac standpoint.  Volume status controlled with hemodialysis.  A-fib remains rate controlled.  Off Eliquis due to recurrent GI bleeding.  Not felt to be a good candidate for a Watchman device.  He is a same-day work-up, so anesthesia team to evaluate on the day of surgery.   VS:  BP Readings from Last 3 Encounters:  03/15/22 110/70  01/04/22 122/73  01/04/22 110/70   Pulse Readings from Last 3 Encounters:  03/15/22 90  01/04/22 (!) 105  01/04/22 99     PROVIDERS: [name], MD is PCP  Glori Bickers, MD is HF cardiologist Corliss Parish, MD is nephrologist   LABS: Last results in [facility] include: Lab Results  Component Value Date   WBC 6.5 01/04/2022   HGB 7.2 (L) 01/04/2022   HCT 18.8 (L) 01/04/2022   PLT 142 (L) 01/04/2022   GLUCOSE 115 (H) 01/04/2022   ALT 13 01/04/2022   AST 20 01/04/2022   NA 138 01/04/2022   K 3.5  01/04/2022   CL 96 (L) 01/04/2022   CREATININE 5.94 (H) 01/04/2022   BUN 50 (H) 01/04/2022   CO2 26 01/04/2022   HGBA1C 4.9 05/25/2021    IMAGES: 1V PCXR 11/22/21: IMPRESSION: 1. Stable cardiomegaly without evidence of acute or active cardiopulmonary disease.    EKG: 01/04/22:  Atrial fibrillation at 90 bpm Rightward axis Prolonged QT Abnormal ECG When compared with ECG of 22-Nov-2021 05:33, Nonspecific T wave abnormality now evident in Lateral leads QT has lengthened Since last tracing Atrial fibrillation Rate slower Confirmed by Shelva Majestic [phone]) on 01/04/2022 5:42:19 PM   CV: Echo 05/23/21 (during admission for SOB, choledocholithiasis; done for preoperative risk assessment for bowel resection/cholecystectomy): IMPRESSIONS   1. Left ventricular ejection fraction, by estimation, is 40 to 45%. The  left ventricle has mildly decreased function. The left ventricle  demonstrates global hypokinesis. The left ventricular internal cavity size  was moderately dilated. There is moderate   concentric left ventricular hypertrophy. Left ventricular diastolic  parameters are indeterminate.   2. Right ventricular systolic function is mildly reduced. The right  ventricular size is severely enlarged. There is moderately elevated  pulmonary artery systolic pressure.   3. Left atrial size was severely dilated.   4. Right atrial size was severely dilated.   5. The mitral valve is grossly normal. Severe mitral valve regurgitation.   6. Mechanism appears to be secondary MR; there are multuiple jets that in  summation suggestive severe regurgitation; only blunting of the pulmonary  vein flow. Consider secondary imaging modality (TEE vs CMR) if procedual  intervention is to be  considered.   7. The tricuspid valve is abnormal. Tricuspid valve regurgitation is  severe.   8. The aortic valve is tricuspid. There is mild calcification of the  aortic valve. Aortic valve regurgitation is not  visualized. Aortic valve  sclerosis is present, with no evidence of aortic valve stenosis.   9. The inferior vena cava is dilated in size with <50% respiratory  variability, suggesting right atrial pressure of 15 mmHg.  - Comparison(s): A prior study was performed on 02/17/20. Prior images  reviewed side by side. Similar LV size and dilation.    Long Term Monitor 01/27/20-02/10/20: 1.  Atrial Fibrillation occurred continuously (100% burden), ranging from 43-181 bpm (avg of 84 bpm) 2. Four episodes of non-sustained ventricular tachycardia occurred, the longest lasting 7 beats with an avg rate of 107 bpm. 3. Rare PVCs.    Nuclear stress test 06/06/18: Small region of moderate ischemia in the distal lateral and apical wall. Otherwise probable normal perfusion and soft tissue attenuation This is an intermediate risk study. LV is dilated. Nuclear stress EF: 46%.   Cardiac cath 06/11/11:  No angiographic CAD. Normal filling pressures.  Hemodynamics (mmHg)  RA mean 2  RV 24/3  PA 22/6  PCWP mean 8  LV 96/7  AO 100/64  Oxygen saturations:  PA 78%  AO 99%  Cardiac Output (Fick) 10.4  Cardiac Index (Fick) 5.7  High cardiac output likely due to profound chronic anemia with sickle cell disease. (Dr. Aundra Dubin)   Past Medical History:  Diagnosis Date   A-fib [facility])    Blood dyscrasia    Blood transfusion    "I've had a bunch of them"   CHF (congestive heart failure) ([place])    followed by hf clinic   Chronic kidney disease (CKD), stage III (moderate) ([place])    Dialysis patient ([place]) 04/2019   AV fistula (right arm)   Gout    Headache(784.0)    "probably weekly" (01/13/2014)   Legally blind    Mitral regurgitation    Pulmonary hypertension (HCC)    Shortness of breath dyspnea    Sickle cell disease, type SS (HCC)    Swelling abdomen    Swelling of extremity, left    Swelling of extremity, right    Tricuspid regurgitation     Past Surgical History:  Procedure Laterality Date    A/V FISTULAGRAM Right 07/29/2020   Procedure: A/V FISTULAGRAM - Right Arm;  Surgeon: Angelia Mould, MD;  Location: [place] CV LAB;  Service: Cardiovascular;  Laterality: Right;   BASCILIC VEIN TRANSPOSITION Right 04/12/2014   Procedure: BASILIC VEIN TRANSPOSITION;  Surgeon: Elam Dutch, MD;  Location: [place];  Service: Vascular;  Laterality: Right;   BIOPSY  08/04/2020   Procedure: BIOPSY;  Surgeon: Ladene Artist, MD;  Location: [facility] ENDOSCOPY;  Service: Gastroenterology;;   BIOPSY  06/28/2021   Procedure: BIOPSY;  Surgeon: Jerene Bears, MD;  Location: [facility] ENDOSCOPY;  Service: Gastroenterology;;   BOWEL RESECTION N/A 05/24/2021   Procedure: SMALL BOWEL RESECTION;  Surgeon: Rolm Bookbinder, MD;  Location: [place];  Service: General;  Laterality: N/A;   CARDIAC CATHETERIZATION  05/2011   CARDIOVERSION N/A 06/05/2018   Procedure: CARDIOVERSION;  Surgeon: Jolaine Artist, MD;  Location: [place];  Service: Cardiovascular;  Laterality: N/A;   CARDIOVERSION N/A 10/06/2019   Procedure: CARDIOVERSION;  Surgeon: Jolaine Artist, MD;  Location: [facility] ENDOSCOPY;  Service: Cardiovascular;  Laterality: N/A;   CHOLECYSTECTOMY N/A 05/24/2021   Procedure: OPEN CHOLECYSTECTOMY;  Surgeon: Rolm Bookbinder, MD;  Location: [place];  Service: General;  Laterality: N/A;   COLONOSCOPY WITH PROPOFOL N/A 09/24/2017   Procedure: COLONOSCOPY WITH PROPOFOL;  Surgeon: Jackquline Denmark, MD;  Location: [facility] ENDOSCOPY;  Service: Endoscopy;  Laterality: N/A;   ESOPHAGOGASTRODUODENOSCOPY N/A 09/19/2017   Procedure: ESOPHAGOGASTRODUODENOSCOPY (EGD);  Surgeon: Jackquline Denmark, MD;  Location: [facility] ENDOSCOPY;  Service: Endoscopy;  Laterality: N/A;   ESOPHAGOGASTRODUODENOSCOPY N/A 05/29/2021   Procedure: ESOPHAGOGASTRODUODENOSCOPY (EGD);  Surgeon: Milus Banister, MD;  Location: [facility] ENDOSCOPY;  Service: Gastroenterology;  Laterality: N/A;   ESOPHAGOGASTRODUODENOSCOPY (EGD) WITH PROPOFOL N/A 08/04/2020   Procedure:  ESOPHAGOGASTRODUODENOSCOPY (EGD) WITH PROPOFOL;  Surgeon: Ladene Artist, MD;  Location: [place];  Service: Gastroenterology;  Laterality: N/A;   ESOPHAGOGASTRODUODENOSCOPY (EGD) WITH PROPOFOL N/A 06/28/2021   Procedure: ESOPHAGOGASTRODUODENOSCOPY (EGD) WITH PROPOFOL;  Surgeon: Jerene Bears, MD;  Location: [facility] ENDOSCOPY;  Service: Gastroenterology;  Laterality: N/A;   ESOPHAGOGASTRODUODENOSCOPY (EGD) WITH PROPOFOL N/A 08/10/2021   Procedure: ESOPHAGOGASTRODUODENOSCOPY (EGD) WITH PROPOFOL;  Surgeon: Jackquline Denmark, MD;  Location: [place];  Service: Gastroenterology;  Laterality: N/A;   ESOPHAGOGASTRODUODENOSCOPY (EGD) WITH PROPOFOL N/A 08/02/2021   Procedure: ESOPHAGOGASTRODUODENOSCOPY (EGD) WITH PROPOFOL;  Surgeon: Lavena Bullion, DO;  Location: [place];  Service: Gastroenterology;  Laterality: N/A;   HEMOSTASIS CLIP PLACEMENT  05/29/2021   Procedure: HEMOSTASIS CLIP PLACEMENT;  Surgeon: Milus Banister, MD;  Location: [facility] ENDOSCOPY;  Service: Gastroenterology;;   HOT HEMOSTASIS N/A 05/29/2021   Procedure: HOT HEMOSTASIS (ARGON PLASMA COAGULATION/BICAP);  Surgeon: Milus Banister, MD;  Location: [facility] ENDOSCOPY;  Service: Gastroenterology;  Laterality: N/A;   HOT HEMOSTASIS N/A 06/28/2021   Procedure: HOT HEMOSTASIS (ARGON PLASMA COAGULATION/BICAP);  Surgeon: Jerene Bears, MD;  Location: [facility] ENDOSCOPY;  Service: Gastroenterology;  Laterality: N/A;   HOT HEMOSTASIS N/A 08/10/2021   Procedure: HOT HEMOSTASIS (ARGON PLASMA COAGULATION/BICAP);  Surgeon: Jackquline Denmark, MD;  Location: [facility] ENDOSCOPY;  Service: Gastroenterology;  Laterality: N/A;   HOT HEMOSTASIS N/A 08/02/2021   Procedure: HOT HEMOSTASIS (ARGON PLASMA COAGULATION/BICAP);  Surgeon: Lavena Bullion, DO;  Location: [facility] ENDOSCOPY;  Service: Gastroenterology;  Laterality: N/A;   IR PERC TUN PERIT CATH WO PORT S&I /IMAG  05/23/2021   IR REMOVAL TUN CV CATH W/O FL  06/10/2021   IR US GUIDE VASC ACCESS RIGHT  05/23/2021   LAPAROSCOPY  N/A 05/24/2021   Procedure: LAPAROSCOPY DIAGNOSTIC;  Surgeon: Rolm Bookbinder, MD;  Location: [place];  Service: General;  Laterality: N/A;   LAPAROTOMY N/A 05/24/2021   Procedure: EXPLORATORY LAPAROTOMY;  Surgeon: Rolm Bookbinder, MD;  Location: [place];  Service: General;  Laterality: N/A;   LEFT AND RIGHT HEART CATHETERIZATION WITH CORONARY ANGIOGRAM N/A 06/11/2011   Procedure: LEFT AND RIGHT HEART CATHETERIZATION WITH CORONARY ANGIOGRAM;  Surgeon: Larey Dresser, MD;  Location: [facility] CATH LAB;  Service: Cardiovascular;  Laterality: N/A;   PERIPHERAL VASCULAR BALLOON ANGIOPLASTY Right 07/29/2020   Procedure: PERIPHERAL VASCULAR BALLOON ANGIOPLASTY;  Surgeon: Angelia Mould, MD;  Location: [place] CV LAB;  Service: Cardiovascular;  Laterality: Right;  arm fistula   REVISON OF ARTERIOVENOUS FISTULA Right 08/08/2020   Procedure: ANEURYSM EXCISION OF RIGHT UPPER EXTREMITY ARTERIOVENOUS FISTULA;  Surgeon: Angelia Mould, MD;  Location: [place];  Service: Vascular;  Laterality: Right;   SCLEROTHERAPY  05/29/2021   Procedure: Clide Deutscher;  Surgeon: Milus Banister, MD;  Location: [place];  Service: Gastroenterology;;   Lavell Islam REMOVAL  05/29/2021   Procedure: STENT REMOVAL;  Surgeon: Milus Banister, MD;  Location: [facility] ENDOSCOPY;  Service: Gastroenterology;;   TEE WITHOUT CARDIOVERSION N/A 12/27/2015   Procedure: TRANSESOPHAGEAL ECHOCARDIOGRAM (TEE);  Surgeon: Sueanne Margarita, MD;  Location: [facility] ENDOSCOPY;  Service: Cardiovascular;  Laterality: N/A;    MEDICATIONS: No current facility-administered medications for this encounter.    acetaminophen (TYLENOL) 500 MG tablet   allopurinol (ZYLOPRIM) 100 MG tablet   AURYXIA 1 GM 210 MG(Fe) tablet   fluticasone (FLONASE) 50 MCG/ACT nasal spray   folic acid (FOLVITE) 1 MG tablet   hydroxyurea (HYDREA) 500 MG capsule   lidocaine-prilocaine (EMLA) cream   Nutritional Supplements (,FEEDING SUPPLEMENT, PROSOURCE PLUS) liquid   Oxycodone  HCl 10 MG TABS   pantoprazole (PROTONIX) 40 MG tablet   tiZANidine (ZANAFLEX) 4 MG tablet   torsemide (DEMADEX) 20 MG tablet   Vitamin D, Ergocalciferol, (DRISDOL) 1.25 MG (50000 UNIT) CAPS capsule    Myra Gianotti, PA-C Surgical Short Stay/Anesthesiology [facility] Phone [phone] [facility] Phone [phone] 03/20/2022 1:34 PM

## 2022-03-21 ENCOUNTER — Ambulatory Visit (HOSPITAL_COMMUNITY)
Admission: RE | Admit: 2022-03-21 | Discharge: 2022-03-21 | Disposition: A | Discharge status: Home / Self Care | Payer: HMO | Attending: Vascular Surgery | Admitting: Vascular Surgery

## 2022-03-21 ENCOUNTER — Encounter (HOSPITAL_COMMUNITY): Payer: Self-pay | Admitting: Vascular Surgery

## 2022-03-21 ENCOUNTER — Ambulatory Visit (HOSPITAL_COMMUNITY): Payer: HMO | Admitting: Vascular Surgery

## 2022-03-21 ENCOUNTER — Other Ambulatory Visit: Payer: Self-pay

## 2022-03-21 ENCOUNTER — Ambulatory Visit (HOSPITAL_BASED_OUTPATIENT_CLINIC_OR_DEPARTMENT_OTHER): Payer: HMO | Admitting: Vascular Surgery

## 2022-03-21 ENCOUNTER — Encounter (HOSPITAL_COMMUNITY)
Admission: RE | Disposition: A | Discharge status: Home / Self Care | Payer: Self-pay | Source: Home / Self Care | Attending: Vascular Surgery

## 2022-03-21 DIAGNOSIS — Z992 Dependence on renal dialysis: Secondary | ICD-10-CM

## 2022-03-21 DIAGNOSIS — K219 Gastro-esophageal reflux disease without esophagitis: Secondary | ICD-10-CM | POA: Diagnosis not present

## 2022-03-21 DIAGNOSIS — L98491 Non-pressure chronic ulcer of skin of other sites limited to breakdown of skin: Secondary | ICD-10-CM | POA: Insufficient documentation

## 2022-03-21 DIAGNOSIS — I509 Heart failure, unspecified: Secondary | ICD-10-CM

## 2022-03-21 DIAGNOSIS — Z87891 Personal history of nicotine dependence: Secondary | ICD-10-CM | POA: Diagnosis not present

## 2022-03-21 DIAGNOSIS — D631 Anemia in chronic kidney disease: Secondary | ICD-10-CM

## 2022-03-21 DIAGNOSIS — J449 Chronic obstructive pulmonary disease, unspecified: Secondary | ICD-10-CM | POA: Diagnosis not present

## 2022-03-21 DIAGNOSIS — X58XXXA Exposure to other specified factors, initial encounter: Secondary | ICD-10-CM | POA: Insufficient documentation

## 2022-03-21 DIAGNOSIS — T82898A Other specified complication of vascular prosthetic devices, implants and grafts, initial encounter: Secondary | ICD-10-CM | POA: Diagnosis not present

## 2022-03-21 DIAGNOSIS — D571 Sickle-cell disease without crisis: Secondary | ICD-10-CM | POA: Diagnosis not present

## 2022-03-21 DIAGNOSIS — N186 End stage renal disease: Secondary | ICD-10-CM

## 2022-03-21 DIAGNOSIS — T82510A Breakdown (mechanical) of surgically created arteriovenous fistula, initial encounter: Secondary | ICD-10-CM | POA: Diagnosis not present

## 2022-03-21 DIAGNOSIS — E1122 Type 2 diabetes mellitus with diabetic chronic kidney disease: Secondary | ICD-10-CM | POA: Diagnosis not present

## 2022-03-21 DIAGNOSIS — I4891 Unspecified atrial fibrillation: Secondary | ICD-10-CM | POA: Diagnosis not present

## 2022-03-21 DIAGNOSIS — I132 Hypertensive heart and chronic kidney disease with heart failure and with stage 5 chronic kidney disease, or end stage renal disease: Secondary | ICD-10-CM | POA: Diagnosis not present

## 2022-03-21 DIAGNOSIS — N185 Chronic kidney disease, stage 5: Secondary | ICD-10-CM | POA: Diagnosis not present

## 2022-03-21 HISTORY — DX: Cardiac arrhythmia, unspecified: I49.9

## 2022-03-21 HISTORY — PX: REVISON OF ARTERIOVENOUS FISTULA: SHX6074

## 2022-03-21 HISTORY — DX: Rheumatic tricuspid insufficiency: I07.1

## 2022-03-21 HISTORY — DX: Nonrheumatic mitral (valve) insufficiency: I34.0

## 2022-03-21 HISTORY — DX: Pulmonary hypertension, unspecified: I27.20

## 2022-03-21 HISTORY — DX: Unspecified atrial fibrillation: I48.91

## 2022-03-21 LAB — POCT I-STAT, CHEM 8
BUN: 59 mg/dL — ABNORMAL HIGH (ref 8–23)
Calcium, Ion: 0.95 mmol/L — ABNORMAL LOW (ref 1.15–1.40)
Chloride: 95 mmol/L — ABNORMAL LOW (ref 98–111)
Creatinine, Ser: 7.3 mg/dL — ABNORMAL HIGH (ref 0.61–1.24)
Glucose, Bld: 79 mg/dL (ref 70–99)
HCT: 25 % — ABNORMAL LOW (ref 39.0–52.0)
Hemoglobin: 8.5 g/dL — ABNORMAL LOW (ref 13.0–17.0)
Potassium: 4.3 mmol/L (ref 3.5–5.1)
Sodium: 135 mmol/L (ref 135–145)
TCO2: 29 mmol/L (ref 22–32)

## 2022-03-21 SURGERY — REVISON OF ARTERIOVENOUS FISTULA
Anesthesia: General | Site: Arm Upper | Laterality: Right

## 2022-03-21 MED ORDER — STERILE WATER FOR IRRIGATION IR SOLN
Status: DC | PRN
  Administered 2022-03-21: 1000 mL

## 2022-03-21 MED ORDER — CHLORHEXIDINE GLUCONATE 4 % EX LIQD: 60.0000 mL | Freq: Once | CUTANEOUS | Status: DC

## 2022-03-21 MED ORDER — PROPOFOL 10 MG/ML IV BOLUS
INTRAVENOUS | Status: AC
  Filled 2022-03-21: qty 20

## 2022-03-21 MED ORDER — FENTANYL CITRATE (PF) 250 MCG/5ML IJ SOLN
INTRAMUSCULAR | Status: AC
  Filled 2022-03-21: qty 5

## 2022-03-21 MED ORDER — FENTANYL CITRATE (PF) 100 MCG/2ML IJ SOLN: 25.0000 ug | INTRAMUSCULAR | Status: DC | PRN

## 2022-03-21 MED ORDER — LIDOCAINE 2% (20 MG/ML) 5 ML SYRINGE
INTRAMUSCULAR | Status: DC | PRN
  Administered 2022-03-21: 80 mg via INTRAVENOUS

## 2022-03-21 MED ORDER — ACETAMINOPHEN 500 MG PO TABS
1000.0000 mg | ORAL_TABLET | Freq: Once | ORAL | Status: AC
  Administered 2022-03-21: 1000 mg via ORAL
  Filled 2022-03-21: qty 2

## 2022-03-21 MED ORDER — CEFAZOLIN SODIUM-DEXTROSE 2-4 GM/100ML-% IV SOLN
2.0000 g | INTRAVENOUS | Status: AC
  Administered 2022-03-21: 2 g via INTRAVENOUS
  Filled 2022-03-21: qty 100

## 2022-03-21 MED ORDER — ORAL CARE MOUTH RINSE: 15.0000 mL | Freq: Once | OROMUCOSAL | Status: AC

## 2022-03-21 MED ORDER — LACTATED RINGERS IV SOLN: INTRAVENOUS | Status: DC

## 2022-03-21 MED ORDER — DEXAMETHASONE SODIUM PHOSPHATE 10 MG/ML IJ SOLN
INTRAMUSCULAR | Status: DC | PRN
  Administered 2022-03-21: 4 mg via INTRAVENOUS

## 2022-03-21 MED ORDER — PROPOFOL 10 MG/ML IV BOLUS
INTRAVENOUS | Status: DC | PRN
  Administered 2022-03-21: 30 mg via INTRAVENOUS
  Administered 2022-03-21 (×2): 50 mg via INTRAVENOUS

## 2022-03-21 MED ORDER — HEPARIN 6000 UNIT IRRIGATION SOLUTION
Status: AC
  Filled 2022-03-21: qty 500

## 2022-03-21 MED ORDER — HEPARIN 6000 UNIT IRRIGATION SOLUTION
Status: DC | PRN
  Administered 2022-03-21: 1

## 2022-03-21 MED ORDER — SODIUM CHLORIDE 0.9 % IV SOLN: INTRAVENOUS | Status: DC

## 2022-03-21 MED ORDER — CHLORHEXIDINE GLUCONATE 0.12 % MT SOLN
15.0000 mL | Freq: Once | OROMUCOSAL | Status: AC
  Administered 2022-03-21: 15 mL via OROMUCOSAL
  Filled 2022-03-21: qty 15

## 2022-03-21 MED ORDER — LIDOCAINE HCL 1 % IJ SOLN
INTRAMUSCULAR | Status: DC | PRN
  Administered 2022-03-21: 10 mL

## 2022-03-21 MED ORDER — FENTANYL CITRATE (PF) 100 MCG/2ML IJ SOLN
INTRAMUSCULAR | Status: DC | PRN
  Administered 2022-03-21: 25 ug via INTRAVENOUS

## 2022-03-21 MED ORDER — LIDOCAINE-EPINEPHRINE (PF) 1 %-1:200000 IJ SOLN
INTRAMUSCULAR | Status: AC
  Filled 2022-03-21: qty 30

## 2022-03-21 MED ORDER — OXYCODONE HCL 10 MG PO TABS: 10.0000 mg | ORAL_TABLET | Freq: Four times a day (QID) | ORAL | 0 refills | Status: DC | PRN

## 2022-03-21 MED ORDER — PHENYLEPHRINE HCL-NACL 20-0.9 MG/250ML-% IV SOLN
INTRAVENOUS | Status: DC | PRN
  Administered 2022-03-21: 40 ug/min via INTRAVENOUS

## 2022-03-21 MED ORDER — MIDAZOLAM HCL 2 MG/2ML IJ SOLN
INTRAMUSCULAR | Status: DC | PRN
  Administered 2022-03-21 (×2): 1 mg via INTRAVENOUS

## 2022-03-21 MED ORDER — 0.9 % SODIUM CHLORIDE (POUR BTL) OPTIME
TOPICAL | Status: DC | PRN
  Administered 2022-03-21: 1000 mL

## 2022-03-21 MED ORDER — MIDAZOLAM HCL 2 MG/2ML IJ SOLN
INTRAMUSCULAR | Status: AC
  Filled 2022-03-21: qty 2

## 2022-03-21 MED ORDER — ONDANSETRON HCL 4 MG/2ML IJ SOLN
INTRAMUSCULAR | Status: DC | PRN
  Administered 2022-03-21: 4 mg via INTRAVENOUS

## 2022-03-21 SURGICAL SUPPLY — 35 items
ARMBAND PINK RESTRICT EXTREMIT (MISCELLANEOUS) ×1 IMPLANT
BAG COUNTER SPONGE SURGICOUNT (BAG) ×1 IMPLANT
CANISTER SUCT 3000ML PPV (MISCELLANEOUS) ×1 IMPLANT
CLIP VESOCCLUDE MED 6/CT (CLIP) ×1 IMPLANT
CLIP VESOCCLUDE SM WIDE 6/CT (CLIP) ×1 IMPLANT
COVER PROBE W GEL 5X96 (DRAPES) IMPLANT
DERMABOND ADVANCED .7 DNX12 (GAUZE/BANDAGES/DRESSINGS) ×1 IMPLANT
ELECT REM PT RETURN 9FT ADLT (ELECTROSURGICAL) ×1
ELECTRODE REM PT RTRN 9FT ADLT (ELECTROSURGICAL) ×1 IMPLANT
GLOVE BIO SURGEON STRL SZ7.5 (GLOVE) ×1 IMPLANT
GLOVE BIOGEL PI IND STRL 8 (GLOVE) ×1 IMPLANT
GOWN STRL REUS W/ TWL LRG LVL3 (GOWN DISPOSABLE) ×2 IMPLANT
GOWN STRL REUS W/ TWL XL LVL3 (GOWN DISPOSABLE) ×2 IMPLANT
GOWN STRL REUS W/TWL LRG LVL3 (GOWN DISPOSABLE) ×2
GOWN STRL REUS W/TWL XL LVL3 (GOWN DISPOSABLE) ×2
HEMOSTAT SPONGE AVITENE ULTRA (HEMOSTASIS) IMPLANT
KIT BASIN OR (CUSTOM PROCEDURE TRAY) ×1 IMPLANT
KIT TURNOVER KIT B (KITS) ×1 IMPLANT
LOOP VESSEL MINI RED (MISCELLANEOUS) IMPLANT
NS IRRIG 1000ML POUR BTL (IV SOLUTION) ×1 IMPLANT
PACK CV ACCESS (CUSTOM PROCEDURE TRAY) ×1 IMPLANT
PAD ARMBOARD 7.5X6 YLW CONV (MISCELLANEOUS) ×2 IMPLANT
SLING ARM FOAM STRAP LRG (SOFTGOODS) IMPLANT
SLING ARM FOAM STRAP MED (SOFTGOODS) IMPLANT
SPIKE FLUID TRANSFER (MISCELLANEOUS) ×1 IMPLANT
STAPLER VISISTAT 35W (STAPLE) IMPLANT
SUT MNCRL AB 4-0 PS2 18 (SUTURE) ×1 IMPLANT
SUT PROLENE 5 0 C 1 24 (SUTURE) IMPLANT
SUT PROLENE 6 0 BV (SUTURE) IMPLANT
SUT PROLENE 7 0 BV 1 (SUTURE) IMPLANT
SUT VIC AB 3-0 SH 27 (SUTURE) ×1
SUT VIC AB 3-0 SH 27X BRD (SUTURE) ×1 IMPLANT
TOWEL GREEN STERILE (TOWEL DISPOSABLE) ×1 IMPLANT
UNDERPAD 30X36 HEAVY ABSORB (UNDERPADS AND DIAPERS) ×1 IMPLANT
WATER STERILE IRR 1000ML POUR (IV SOLUTION) ×1 IMPLANT

## 2022-03-21 NOTE — Op Note (Signed)
Date: March 21, 2022  Preoperative diagnosis: Ulceration overlying right arm AV fistula (brachiocephalic)  Postoperative diagnosis: Same  Procedure: Right arm AV fistula revision with excision of ulcerated skin  Surgeon: Dr. Marty Heck, MD  Assistant: Arlee Muslim, PA  Indications: Patient is a 66 year old male with end-stage renal disease that was seen in the office last week with an aneurysmal right arm AV fistula with two ulcers adjacent to each other and was evaluated by Dr. Scot Dock.  He presents today for ulcer excision.  Since evaluation in the office one of these ulcers has healed.  He only has one ulceration.  I discussed likely only excising the one ulcerated area today.  Risk benefits discussed.  Findings: Ulceration over the right upper arm AV fistula was excised with an elliptical excision (without entering the fistula).  The skin was then primarily closed over the fistula after we raised flaps using 3-0 Vicryl 4-0 Monocryl.  Excellent thrill at completion.  Anesthesia: LMA with local  Details: Patient was taken to the operating room after informed consent was obtained.  Placed on operative table in the supine position.  LMA was induced.  The right arm was prepped and draped in standard sterile fashion.  I then used 1% local with epinephrine to anesthetize the ulceration over the mid upper arm AV fistula.  I then marked out an elliptical incision and this was made with a 15 blade scalpel and dissection was carried down with Bovie cautery.  The ulcerated skin was then completely excised without entering the fistula.  I then raised flaps in the subcutaneous tissue.  The skin was then closed with 3-0 Vicryl 4-0 Monocryl and Dermabond.  Excellent thrill at completion.  Complication: None  Condition: Stable  Marty Heck, MD [facility] [place] Office: [place]

## 2022-03-21 NOTE — Discharge Instructions (Signed)
° °  [facility] ° °Discharge Instructions ° °AV Fistula or Graft Surgery for Dialysis Access ° °Please refer to the following instructions for your post-procedure care. Your surgeon or physician assistant will discuss any changes with you. ° °Activity ° °You may drive the day following your surgery, if you are comfortable and no longer taking prescription pain medication. Resume full activity as the soreness in your incision resolves. ° °Bathing/Showering ° °You may shower after you go home. Keep your incision dry for 48 hours. Do not soak in a bathtub, hot tub, or swim until the incision heals completely. You may not shower if you have a hemodialysis catheter. ° °Incision Care ° °Clean your incision with mild soap and water after 48 hours. Pat the area dry with a clean towel. You do not need a bandage unless otherwise instructed. Do not apply any ointments or creams to your incision. You may have skin glue on your incision. Do not peel it off. It will come off on its own in about one week. Your arm may swell a bit after surgery. To reduce swelling use pillows to elevate your arm so it is above your heart. Your doctor will tell you if you need to lightly wrap your arm with an ACE bandage. ° °Diet ° °Resume your normal diet. There are not special food restrictions following this procedure. In order to heal from your surgery, it is CRITICAL to get adequate nutrition. Your body requires vitamins, minerals, and protein. Vegetables are the best source of vitamins and minerals. Vegetables also provide the perfect balance of protein. Processed food has little nutritional value, so try to avoid this. ° °Medications ° °Resume taking all of your medications. If your incision is causing pain, you may take over-the counter pain relievers such as acetaminophen (Tylenol). If you were prescribed a stronger pain medication, please be aware these medications can cause nausea and constipation. Prevent  nausea by taking the medication with a snack or meal. Avoid constipation by drinking plenty of fluids and eating foods with high amount of fiber, such as fruits, vegetables, and grains. Do not take Tylenol if you are taking prescription pain medications. ° ° ° ° °Follow up °Your surgeon may want to see you in the office following your access surgery. If so, this will be arranged at the time of your surgery. ° °Please call us immediately for any of the following conditions: ° °Increased pain, redness, drainage (pus) from your incision site °Fever of 101 degrees or higher °Severe or worsening pain at your incision site °Hand pain or numbness. ° °Reduce your risk of vascular disease: ° °Stop smoking. If you would like help, call [facility] at 1-800-QUIT-NOW (1-800-784-8669) or [place] at 336-586-4000 ° °Manage your cholesterol °Maintain a desired weight °Control your diabetes °Keep your blood pressure down ° °Dialysis ° °It will take several weeks to several months for your new dialysis access to be ready for use. Your surgeon will determine when it is OK to use it. Your nephrologist will continue to direct your dialysis. You can continue to use your Permcath until your new access is ready for use. ° °If you have any questions, please call the office at [phone]. ° °

## 2022-03-21 NOTE — H&P (Signed)
History and Physical Interval Note:  03/21/2022 12:21 PM  Patrick [name]  has presented today for surgery, with the diagnosis of ESRD.  The various methods of treatment have been discussed with the patient and family. After consideration of risks, benefits and other options for treatment, the patient has consented to  Procedure(s): EXCISION OF ULCERS RIGHT ARTERIOVENOUS FISTULA (Right) as a surgical intervention.  The patient's history has been reviewed, patient examined, no change in status, stable for surgery.  I have reviewed the patient's chart and labs.  Questions were answered to the patient's satisfaction.    Only evidence of one ulcer over fistula today.  Discussed may only need one area excised since seeing Dr. Scot Dock in the office.  Will assess in OR.  Marty Heck       REASON FOR VISIT:    Ulcerations overlying right upper arm fistula.  The consult is requested by Dr. Moshe Cipro.   MEDICAL ISSUES:    ULCERATIONS OVERLYING RIGHT AV FISTULA: This patient has 2 ulcerations adjacent to his right upper arm fistula.  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  I would recommend simply excising the areas of concern and closing the skin over the fistula without plicating the aneurysm.  Entire fistula is aneurysmal as documented in the photograph below.  I have instructed him to tell the dialysis nurses not to cannulate in these areas.  He dialyzes on Tuesdays Thursdays and Saturdays we will schedule this on a nondialysis day.  He was unable to proceed tomorrow so we will schedule him for Wednesday of next week as this works better with his work schedule.   HPI:    Patrick [name] is a pleasant 66 y.o. male who was referred with an ulceration overlying his AV fistula.  I last saw him in February 2022 when he had excision of an aneurysm of his right arm fistula.  This was in the central portion of the right upper arm fistula.  We have not seen him in follow-up since that time.    He was referred back because of some areas of concern overlying his fistula.  He states that these have been oozing some for the last several weeks and therefore he was set up for evaluation.  He states that otherwise the fistula has been working well.  He dialyzes on Tuesdays Thursdays and Saturdays.       Past Medical History:  Diagnosis Date   Blood dyscrasia     Blood transfusion      "I've had a bunch of them"   CHF (congestive heart failure) (HCC)      followed by [facility] clinic   Chronic kidney disease (CKD), stage III (moderate) (HCC)     Dialysis patient ([facility]) 04/2019    AV fistula (right arm)   Gout     Headache(784.0)      "probably weekly" (01/13/2014)   Legally blind     Shortness of breath dyspnea     Sickle cell disease, type SS (HCC)     Swelling abdomen     Swelling of extremity, left     Swelling of extremity, right             Family History  Problem Relation Age of Onset   Alcohol abuse Mother     Liver disease Mother     Cirrhosis Mother     Heart attack Mother        SOCIAL HISTORY: Social History  Tobacco Use   Smoking status: Former      Packs/day: 0.50      Years: 41.00      Total pack years: 20.50      Types: Cigarettes      Quit date: 12/08/2015      Years since quitting: 6.2   Smokeless tobacco: Never  Substance Use Topics   Alcohol use: Not Currently      Alcohol/week: 3.0 standard drinks of alcohol      Types: 1 Glasses of wine, 2 Cans of beer per week      Comment: occasional      No Known Allergies         Current Outpatient Medications  Medication Sig Dispense Refill   acetaminophen (TYLENOL) 500 MG tablet Take 1,000 mg by mouth every 6 (six) hours as needed for moderate pain.       allopurinol (ZYLOPRIM) 100 MG tablet TAKE 1 TABLET BY MOUTH EVERY DAY 30 tablet 1   AURYXIA 1 GM 210 MG(Fe) tablet Take 210 mg by mouth 3 (three) times daily.       fluticasone (FLONASE) 50 MCG/ACT nasal spray Place 2 sprays into both nostrils  daily. 16 g 6   folic acid (FOLVITE) 1 MG tablet TAKE 1 TABLET BY MOUTH EVERY DAY 90 tablet 3   hydroxyurea (HYDREA) 500 MG capsule Take 1 capsule (500 mg total) by mouth daily. 90 capsule 1   lidocaine-prilocaine (EMLA) cream Apply 1 application. topically Every Tuesday,Thursday,and Saturday with dialysis.       Nutritional Supplements (,FEEDING SUPPLEMENT, PROSOURCE PLUS) liquid Take 30 mLs by mouth 2 (two) times daily between meals.       Oxycodone HCl 10 MG TABS Take 1 tablet (10 mg total) by mouth every 6 (six) hours as needed. 60 tablet 0   pantoprazole (PROTONIX) 40 MG tablet TAKE 1 TABLET BY MOUTH TWICE A DAY 60 tablet 3   tiZANidine (ZANAFLEX) 4 MG tablet TAKE 1/2 TABLET (2MG  TOTAL) BY MOUTH EVERY 8 HOURS AS NEEDED FOR MUSCLE SPASM 30 tablet 1   torsemide (DEMADEX) 20 MG tablet Take 20 mg by mouth in the morning, at noon, in the evening, and at bedtime.       Vitamin D, Ergocalciferol, (DRISDOL) 1.25 MG (50000 UNIT) CAPS capsule Take 1 capsule (50,000 Units total) by mouth once a week. Monday 12 capsule 1    No current facility-administered medications for this visit.      REVIEW OF SYSTEMS:  [X]  denotes positive finding, [ ]  denotes negative finding Cardiac   Comments:  Chest pain or chest pressure:      Shortness of breath upon exertion:      Short of breath when lying flat:      Irregular heart rhythm:             Vascular      Pain in calf, thigh, or hip brought on by ambulation:      Pain in feet at night that wakes you up from your sleep:       Blood clot in your veins:      Leg swelling:              Pulmonary      Oxygen at home:      Productive cough:       Wheezing:              Neurologic      Sudden weakness in arms or legs:  Sudden numbness in arms or legs:       Sudden onset of difficulty speaking or slurred speech:      Temporary loss of vision in one eye:       Problems with dizziness:              Gastrointestinal      Blood in stool:        Vomited blood:              Genitourinary      Burning when urinating:       Blood in urine:             Psychiatric      Major depression:              Hematologic      Bleeding problems:      Problems with blood clotting too easily:             Skin      Rashes or ulcers:             Constitutional      Fever or chills:        PHYSICAL EXAM:       Vitals:    03/15/22 1332  BP: 110/70  Pulse: 90  Resp: 20  Temp: 98 F (36.7 C)  Weight: 132 lb (59.9 kg)  Height: 5\' 11"  (1.803 m)      GENERAL: The patient is a well-nourished male, in no acute distress. The vital signs are documented above. CARDIAC: There is a regular rate and rhythm.  VASCULAR: His right upper arm fistula is aneurysmal.  It is slightly pulsatile but otherwise has a good thrill.  There are 2 areas where the skin is thinned out as documented in the photograph below.    PULMONARY: There is good air exchange bilaterally without wheezing or rales.  MUSCULOSKELETAL: There are no major deformities or cyanosis. NEUROLOGIC: No focal weakness or paresthesias are detected. PSYCHIATRIC: The patient has a normal affect.   DATA:     No new data.   Deitra Mayo [facility] [phone]

## 2022-03-21 NOTE — Transfer of Care (Signed)
Immediate Anesthesia Transfer of Care Note  Patient: Patrick [name]  Procedure(s) Performed: EXCISION OF ULCERATED SKIN OF RIGHT ARTERIOVENOUS FISTULA REVISION (Right: Arm Upper)  Patient Location: PACU  Anesthesia Type:General  Level of Consciousness: awake, alert  and oriented  Airway & Oxygen Therapy: Patient Spontanous Breathing  Post-op Assessment: Report given to RN and Post -op Vital signs reviewed and stable  Post vital signs: Reviewed and stable  Last Vitals:  Vitals Value Taken Time  BP 92/64 03/21/22 1401  Temp    Pulse 83 03/21/22 1407  Resp 14 03/21/22 1407  SpO2 99 % 03/21/22 1407  Vitals shown include unvalidated device data.  Last Pain:  Vitals:   03/21/22 1000  TempSrc:   PainSc: 0-No pain      Patients Stated Pain Goal: 0 (94/99/71 [id number])  Complications: No notable events documented.

## 2022-03-21 NOTE — Progress Notes (Signed)
Dr. Gifford Shave made aware of ISTAT result- hemoglobin 8.5 and that patient's pulse rate was 106 upon arrival to Short Stay. No new orders received at this time.

## 2022-03-22 ENCOUNTER — Encounter (HOSPITAL_COMMUNITY): Payer: Self-pay | Admitting: Vascular Surgery

## 2022-03-22 NOTE — Anesthesia Postprocedure Evaluation (Signed)
Anesthesia Post Note  Patient: Patrick [name]  Procedure(s) Performed: EXCISION OF ULCERATED SKIN OF RIGHT ARTERIOVENOUS FISTULA REVISION (Right: Arm Upper)     Patient location during evaluation: PACU Anesthesia Type: General Level of consciousness: awake and alert Pain management: pain level controlled Vital Signs Assessment: post-procedure vital signs reviewed and stable Respiratory status: spontaneous breathing, nonlabored ventilation, respiratory function stable and patient connected to nasal cannula oxygen Cardiovascular status: blood pressure returned to baseline and stable Postop Assessment: no apparent nausea or vomiting Anesthetic complications: no   No notable events documented.  Last Vitals:  Vitals:   03/21/22 1415 03/21/22 1430  BP: 101/77 103/74  Pulse: 88 83  Resp: 14 16  Temp:  36.5 C  SpO2: 92% 94%    Last Pain:  Vitals:   03/21/22 1430  TempSrc:   PainSc: 0-No pain                 Santa Lighter

## 2022-03-27 ENCOUNTER — Telehealth: Payer: Self-pay

## 2022-03-27 ENCOUNTER — Other Ambulatory Visit: Payer: Self-pay | Admitting: Family Medicine

## 2022-03-27 DIAGNOSIS — D571 Sickle-cell disease without crisis: Secondary | ICD-10-CM

## 2022-03-27 DIAGNOSIS — M545 Low back pain, unspecified: Secondary | ICD-10-CM

## 2022-03-27 MED ORDER — OXYCODONE HCL 10 MG PO TABS: 10.0000 mg | ORAL_TABLET | Freq: Four times a day (QID) | ORAL | 0 refills | Status: DC | PRN

## 2022-03-27 MED ORDER — TIZANIDINE HCL 4 MG PO TABS: ORAL_TABLET | ORAL | 2 refills | Status: DC

## 2022-03-27 NOTE — Progress Notes (Signed)
Reviewed PDMP substance reporting system prior to prescribing opiate medications. No inconsistencies noted.  Meds ordered this encounter  Medications   tiZANidine (ZANAFLEX) 4 MG tablet    Sig: TAKE 1/2 TABLET (2MG  TOTAL) BY MOUTH EVERY 8 HOURS AS NEEDED FOR MUSCLE SPASM    Dispense:  30 tablet    Refill:  2    Order Specific Question:   Supervising Provider    Answer:   [name] [[id number]]   Oxycodone HCl 10 MG TABS    Sig: Take 1 tablet (10 mg total) by mouth every 6 (six) hours as needed.    Dispense:  60 tablet    Refill:  0    Order Specific Question:   Supervising Provider    Answer:   [name] [[id number]]   [name]  APRN, MSN, FNP-C [facility] [address] [place], [place] [address] [phone]

## 2022-03-27 NOTE — Telephone Encounter (Signed)
Back spasm med refill Oxycodone

## 2022-03-31 ENCOUNTER — Other Ambulatory Visit: Payer: Self-pay

## 2022-03-31 ENCOUNTER — Emergency Department (HOSPITAL_BASED_OUTPATIENT_CLINIC_OR_DEPARTMENT_OTHER): Payer: HMO

## 2022-03-31 ENCOUNTER — Encounter (HOSPITAL_COMMUNITY): Payer: Self-pay

## 2022-03-31 ENCOUNTER — Inpatient Hospital Stay (HOSPITAL_BASED_OUTPATIENT_CLINIC_OR_DEPARTMENT_OTHER)
Admission: EM | Admit: 2022-03-31 | Discharge: 2022-04-08 | DRG: 811 | Disposition: A | Discharge status: Home / Self Care | Payer: HMO | Attending: Internal Medicine | Admitting: Internal Medicine

## 2022-03-31 ENCOUNTER — Encounter (HOSPITAL_BASED_OUTPATIENT_CLINIC_OR_DEPARTMENT_OTHER): Payer: Self-pay | Admitting: Emergency Medicine

## 2022-03-31 DIAGNOSIS — K2961 Other gastritis with bleeding: Secondary | ICD-10-CM | POA: Diagnosis present

## 2022-03-31 DIAGNOSIS — R7989 Other specified abnormal findings of blood chemistry: Secondary | ICD-10-CM | POA: Diagnosis present

## 2022-03-31 DIAGNOSIS — D57419 Sickle-cell thalassemia with crisis, unspecified: Principal | ICD-10-CM | POA: Diagnosis present

## 2022-03-31 DIAGNOSIS — Z8249 Family history of ischemic heart disease and other diseases of the circulatory system: Secondary | ICD-10-CM

## 2022-03-31 DIAGNOSIS — N2581 Secondary hyperparathyroidism of renal origin: Secondary | ICD-10-CM | POA: Diagnosis present

## 2022-03-31 DIAGNOSIS — I272 Pulmonary hypertension, unspecified: Secondary | ICD-10-CM | POA: Diagnosis present

## 2022-03-31 DIAGNOSIS — E876 Hypokalemia: Secondary | ICD-10-CM | POA: Diagnosis not present

## 2022-03-31 DIAGNOSIS — R54 Age-related physical debility: Secondary | ICD-10-CM | POA: Diagnosis present

## 2022-03-31 DIAGNOSIS — D5700 Hb-SS disease with crisis, unspecified: Secondary | ICD-10-CM | POA: Diagnosis present

## 2022-03-31 DIAGNOSIS — Z66 Do not resuscitate: Secondary | ICD-10-CM | POA: Diagnosis present

## 2022-03-31 DIAGNOSIS — J449 Chronic obstructive pulmonary disease, unspecified: Secondary | ICD-10-CM | POA: Diagnosis present

## 2022-03-31 DIAGNOSIS — R059 Cough, unspecified: Secondary | ICD-10-CM | POA: Diagnosis present

## 2022-03-31 DIAGNOSIS — R1010 Upper abdominal pain, unspecified: Secondary | ICD-10-CM

## 2022-03-31 DIAGNOSIS — D571 Sickle-cell disease without crisis: Secondary | ICD-10-CM

## 2022-03-31 DIAGNOSIS — N186 End stage renal disease: Secondary | ICD-10-CM | POA: Diagnosis present

## 2022-03-31 DIAGNOSIS — Z79891 Long term (current) use of opiate analgesic: Secondary | ICD-10-CM

## 2022-03-31 DIAGNOSIS — R195 Other fecal abnormalities: Secondary | ICD-10-CM

## 2022-03-31 DIAGNOSIS — K3189 Other diseases of stomach and duodenum: Secondary | ICD-10-CM | POA: Diagnosis present

## 2022-03-31 DIAGNOSIS — Z87891 Personal history of nicotine dependence: Secondary | ICD-10-CM

## 2022-03-31 DIAGNOSIS — K449 Diaphragmatic hernia without obstruction or gangrene: Secondary | ICD-10-CM | POA: Diagnosis present

## 2022-03-31 DIAGNOSIS — H548 Legal blindness, as defined in USA: Secondary | ICD-10-CM | POA: Diagnosis present

## 2022-03-31 DIAGNOSIS — Z1152 Encounter for screening for COVID-19: Secondary | ICD-10-CM

## 2022-03-31 DIAGNOSIS — R188 Other ascites: Secondary | ICD-10-CM | POA: Diagnosis present

## 2022-03-31 DIAGNOSIS — I10 Essential (primary) hypertension: Secondary | ICD-10-CM | POA: Diagnosis present

## 2022-03-31 DIAGNOSIS — E8729 Other acidosis: Secondary | ICD-10-CM

## 2022-03-31 DIAGNOSIS — M109 Gout, unspecified: Secondary | ICD-10-CM | POA: Diagnosis present

## 2022-03-31 DIAGNOSIS — K839 Disease of biliary tract, unspecified: Secondary | ICD-10-CM

## 2022-03-31 DIAGNOSIS — Z79899 Other long term (current) drug therapy: Secondary | ICD-10-CM

## 2022-03-31 DIAGNOSIS — I4821 Permanent atrial fibrillation: Secondary | ICD-10-CM | POA: Diagnosis present

## 2022-03-31 DIAGNOSIS — R9431 Abnormal electrocardiogram [ECG] [EKG]: Secondary | ICD-10-CM | POA: Diagnosis present

## 2022-03-31 DIAGNOSIS — E872 Acidosis, unspecified: Secondary | ICD-10-CM | POA: Diagnosis present

## 2022-03-31 DIAGNOSIS — I081 Rheumatic disorders of both mitral and tricuspid valves: Secondary | ICD-10-CM | POA: Diagnosis present

## 2022-03-31 DIAGNOSIS — D7589 Other specified diseases of blood and blood-forming organs: Secondary | ICD-10-CM | POA: Diagnosis present

## 2022-03-31 DIAGNOSIS — K863 Pseudocyst of pancreas: Secondary | ICD-10-CM | POA: Diagnosis present

## 2022-03-31 DIAGNOSIS — I132 Hypertensive heart and chronic kidney disease with heart failure and with stage 5 chronic kidney disease, or end stage renal disease: Secondary | ICD-10-CM | POA: Diagnosis present

## 2022-03-31 DIAGNOSIS — Z992 Dependence on renal dialysis: Secondary | ICD-10-CM

## 2022-03-31 DIAGNOSIS — K668 Other specified disorders of peritoneum: Secondary | ICD-10-CM

## 2022-03-31 DIAGNOSIS — E559 Vitamin D deficiency, unspecified: Secondary | ICD-10-CM | POA: Diagnosis present

## 2022-03-31 DIAGNOSIS — Z8719 Personal history of other diseases of the digestive system: Secondary | ICD-10-CM

## 2022-03-31 DIAGNOSIS — K761 Chronic passive congestion of liver: Secondary | ICD-10-CM | POA: Diagnosis present

## 2022-03-31 DIAGNOSIS — I4819 Other persistent atrial fibrillation: Secondary | ICD-10-CM | POA: Diagnosis present

## 2022-03-31 DIAGNOSIS — K254 Chronic or unspecified gastric ulcer with hemorrhage: Secondary | ICD-10-CM | POA: Diagnosis present

## 2022-03-31 DIAGNOSIS — R058 Other specified cough: Secondary | ICD-10-CM | POA: Diagnosis present

## 2022-03-31 DIAGNOSIS — G894 Chronic pain syndrome: Secondary | ICD-10-CM | POA: Diagnosis present

## 2022-03-31 DIAGNOSIS — Z9049 Acquired absence of other specified parts of digestive tract: Secondary | ICD-10-CM

## 2022-03-31 DIAGNOSIS — I5022 Chronic systolic (congestive) heart failure: Secondary | ICD-10-CM | POA: Diagnosis present

## 2022-03-31 DIAGNOSIS — I428 Other cardiomyopathies: Secondary | ICD-10-CM | POA: Diagnosis present

## 2022-03-31 DIAGNOSIS — R6881 Early satiety: Secondary | ICD-10-CM

## 2022-03-31 DIAGNOSIS — E871 Hypo-osmolality and hyponatremia: Secondary | ICD-10-CM | POA: Diagnosis present

## 2022-03-31 DIAGNOSIS — D631 Anemia in chronic kidney disease: Secondary | ICD-10-CM | POA: Diagnosis present

## 2022-03-31 LAB — I-STAT VENOUS BLOOD GAS, ED
Acid-base deficit: 6 mmol/L — ABNORMAL HIGH (ref 0.0–2.0)
Bicarbonate: 20.6 mmol/L (ref 20.0–28.0)
Calcium, Ion: 1.08 mmol/L — ABNORMAL LOW (ref 1.15–1.40)
HCT: 23 % — ABNORMAL LOW (ref 39.0–52.0)
Hemoglobin: 7.8 g/dL — ABNORMAL LOW (ref 13.0–17.0)
O2 Saturation: 53 %
Potassium: 3.7 mmol/L (ref 3.5–5.1)
Sodium: 129 mmol/L — ABNORMAL LOW (ref 135–145)
TCO2: 22 mmol/L (ref 22–32)
pCO2, Ven: 42.9 mmHg — ABNORMAL LOW (ref 44–60)
pH, Ven: 7.289 (ref 7.25–7.43)
pO2, Ven: 31 mmHg — CL (ref 32–45)

## 2022-03-31 LAB — RESP PANEL BY RT-PCR (FLU A&B, COVID) ARPGX2
Influenza A by PCR: NEGATIVE
Influenza B by PCR: NEGATIVE
SARS Coronavirus 2 by RT PCR: NEGATIVE

## 2022-03-31 LAB — RETICULOCYTES
Immature Retic Fract: 30.3 % — ABNORMAL HIGH (ref 2.3–15.9)
RBC.: 1.51 MIL/uL — ABNORMAL LOW (ref 4.22–5.81)
Retic Count, Absolute: 101.6 10*3/uL (ref 19.0–186.0)
Retic Ct Pct: 6.7 % — ABNORMAL HIGH (ref 0.4–3.1)

## 2022-03-31 LAB — CBC WITH DIFFERENTIAL/PLATELET
Abs Immature Granulocytes: 0.3 10*3/uL — ABNORMAL HIGH (ref 0.00–0.07)
Basophils Absolute: 0 10*3/uL (ref 0.0–0.1)
Basophils Relative: 0 %
Eosinophils Absolute: 0.1 10*3/uL (ref 0.0–0.5)
Eosinophils Relative: 1 %
HCT: 16.9 % — ABNORMAL LOW (ref 39.0–52.0)
Hemoglobin: 6.3 g/dL — CL (ref 13.0–17.0)
Lymphocytes Relative: 6 %
Lymphs Abs: 0.5 10*3/uL — ABNORMAL LOW (ref 0.7–4.0)
MCH: 41.4 pg — ABNORMAL HIGH (ref 26.0–34.0)
MCHC: 37.3 g/dL — ABNORMAL HIGH (ref 30.0–36.0)
MCV: 111.2 fL — ABNORMAL HIGH (ref 80.0–100.0)
Metamyelocytes Relative: 1 %
Monocytes Absolute: 0.5 10*3/uL (ref 0.1–1.0)
Monocytes Relative: 6 %
Myelocytes: 3 %
Neutro Abs: 6.2 10*3/uL (ref 1.7–7.7)
Neutrophils Relative %: 83 %
Platelets: 191 10*3/uL (ref 150–400)
RBC: 1.52 MIL/uL — ABNORMAL LOW (ref 4.22–5.81)
RDW: 17.4 % — ABNORMAL HIGH (ref 11.5–15.5)
WBC: 7.5 10*3/uL (ref 4.0–10.5)
nRBC: 29 /100 WBC — ABNORMAL HIGH
nRBC: 59.4 % — ABNORMAL HIGH (ref 0.0–0.2)

## 2022-03-31 LAB — COMPREHENSIVE METABOLIC PANEL
ALT: 28 U/L (ref 0–44)
AST: 79 U/L — ABNORMAL HIGH (ref 15–41)
Albumin: 3.8 g/dL (ref 3.5–5.0)
Alkaline Phosphatase: 487 U/L — ABNORMAL HIGH (ref 38–126)
Anion gap: 24 — ABNORMAL HIGH (ref 5–15)
BUN: 52 mg/dL — ABNORMAL HIGH (ref 8–23)
CO2: 19 mmol/L — ABNORMAL LOW (ref 22–32)
Calcium: 9.1 mg/dL (ref 8.9–10.3)
Chloride: 89 mmol/L — ABNORMAL LOW (ref 98–111)
Creatinine, Ser: 7.35 mg/dL — ABNORMAL HIGH (ref 0.61–1.24)
GFR, Estimated: 8 mL/min — ABNORMAL LOW (ref >60)
Glucose, Bld: 84 mg/dL (ref 70–99)
Potassium: 3.5 mmol/L (ref 3.5–5.1)
Sodium: 132 mmol/L — ABNORMAL LOW (ref 135–145)
Total Bilirubin: 2.2 mg/dL — ABNORMAL HIGH (ref 0.3–1.2)
Total Protein: 8.1 g/dL (ref 6.5–8.1)

## 2022-03-31 LAB — LACTIC ACID, PLASMA: Lactic Acid, Venous: 1.3 mmol/L (ref 0.5–1.9)

## 2022-03-31 LAB — TROPONIN I (HIGH SENSITIVITY)
Troponin I (High Sensitivity): 427 ng/L (ref <18)
Troponin I (High Sensitivity): 378 ng/L (ref <18)

## 2022-03-31 LAB — BETA-HYDROXYBUTYRIC ACID: Beta-Hydroxybutyric Acid: 2.35 mmol/L — ABNORMAL HIGH (ref 0.05–0.27)

## 2022-03-31 LAB — LIPASE, BLOOD: Lipase: 93 U/L — ABNORMAL HIGH (ref 11–51)

## 2022-03-31 LAB — PROTIME-INR
INR: 1.4 — ABNORMAL HIGH (ref 0.8–1.2)
Prothrombin Time: 17.2 seconds — ABNORMAL HIGH (ref 11.4–15.2)

## 2022-03-31 LAB — BRAIN NATRIURETIC PEPTIDE: B Natriuretic Peptide: 3312.5 pg/mL — ABNORMAL HIGH (ref 0.0–100.0)

## 2022-03-31 MED ORDER — SODIUM CHLORIDE 0.9 % IV BOLUS: 1000.0000 mL | Freq: Once | INTRAVENOUS | Status: DC

## 2022-03-31 MED ORDER — HYDROMORPHONE HCL 1 MG/ML IJ SOLN
1.0000 mg | Freq: Once | INTRAMUSCULAR | Status: AC
  Administered 2022-03-31: 1 mg via INTRAVENOUS
  Filled 2022-03-31: qty 1

## 2022-03-31 MED ORDER — ASPIRIN 325 MG PO TBEC: 325.0000 mg | DELAYED_RELEASE_TABLET | Freq: Once | ORAL | Status: DC

## 2022-03-31 MED ORDER — LACTATED RINGERS IV BOLUS
500.0000 mL | Freq: Once | INTRAVENOUS | Status: AC
  Administered 2022-03-31: 500 mL via INTRAVENOUS

## 2022-03-31 MED ORDER — IOHEXOL 300 MG/ML  SOLN
100.0000 mL | Freq: Once | INTRAMUSCULAR | Status: AC | PRN
  Administered 2022-03-31: 100 mL via INTRAVENOUS

## 2022-03-31 NOTE — ED Triage Notes (Signed)
Pt c/o sickle cell crisis that started on Thursday. Pt reports pain to his legs and lower back in addition to nausea. Pt went to dialysis Thursday but did not go to day.

## 2022-03-31 NOTE — ED Notes (Signed)
CRITICAL VALUE STICKER  CRITICAL VALUE:hg 6.3  RECEIVER (on-site recipient of call):abarlow  DATE & TIME NOTIFIED: 03/31/2022 now  MESSENGER (representative from lab):  MD NOTIFIED: scheving  TIME OF NOTIFICATION:now  RESPONSE:

## 2022-03-31 NOTE — ED Notes (Signed)
Came to get pt for CT -- pt wants pain meds before going to CT

## 2022-03-31 NOTE — ED Notes (Signed)
CRITICAL VALUE STICKER  CRITICAL VALUE:trop 45  RECEIVER (on-site recipient of call):abarlow  DATE & TIME NOTIFIED: td,now  MESSENGER (representative from lab):  MD NOTIFIED: scheving  TIME OF NOTIFICATION:now  RESPONSE:

## 2022-03-31 NOTE — ED Notes (Signed)
Right arm restricted multiple attempts just to establish IV, unable to get cultures

## 2022-03-31 NOTE — ED Provider Notes (Signed)
Patrick [name]  CSN: [id number] Arrival date & time: 03/31/22 1325  Chief Complaint(s) Sickle Cell Pain Crisis  HPI Patrick [name] is a 66 y.o. male with history of CHF, end-stage renal disease on dialysis Tuesday, Thursday, Saturday, pulmonary hypertension, sickle cell disease presenting to the emergency department sickle cell pain crisis.  Patient reports pain in his bilateral legs and low back.  He reports this is chronic sickle cell pain.  He has been having pain since Thursday.  He was in dialysis last Thursday.  He reports since then he has had some intermittent nausea and abdominal pain, no fevers or chills, no diarrhea.  He also reports cough but no chest pain or shortness of breath.  He reports generalized fatigue and weakness and decreased appetite.  He reports the abdominal pain comes and goes currently not present.  He reports symptoms primarily due to pain in the bilateral lower extremities and low back.   Past Medical History Past Medical History:  Diagnosis Date   A-fib ([place])    Blood dyscrasia    Blood transfusion    "I've had a bunch of them"   CHF (congestive heart failure) ([place])    followed by hf clinic   Chronic kidney disease (CKD), stage III (moderate) (HCC)    T-TH-S   Dialysis patient ([place]) 04/2019   AV fistula (right arm)   Dysrhythmia    A-Fib per patient   Gout    Headache(784.0)    "probably weekly" (01/13/2014)   Legally blind    Mitral regurgitation    Pulmonary hypertension (HCC)    Shortness of breath dyspnea    Sickle cell disease, type SS (HCC)    Swelling abdomen    Swelling of extremity, left    Swelling of extremity, right    Tricuspid regurgitation    Patient Active Problem List   Diagnosis Date Noted   Iron deficiency anemia 11/30/2021   Congestive heart failure ([place]) 11/30/2021   Renal failure 11/30/2021   Cholelithiasis 11/30/2021   Sickle cell anemia with crisis ([place]) 11/23/2021    Sickle cell pain crisis ([place]) 11/22/2021   Hospital discharge follow-up 09/28/2021   Chronic pain syndrome    Acute on chronic anemia 09/13/2021   Chronic atrial fibrillation with RVR ([place]) 09/13/2021   Hypothermia 08/22/2021   Abdominal fluid collection 08/22/2021   Gastric AVM    Hiatal hernia    Pressure injury of skin 08/01/2021   Hyponatremia 07/31/2021   Hypomagnesemia 07/31/2021   Upper GI bleeding 06/27/2021   H/O major abdominal surgery 06/27/2021   Visual impairment 06/27/2021   COPD not affecting current episode of care 06/27/2021   Empyema of gallbladder 05/27/2021   Acute gangrenous cholecystitis s/p open cholcystectomy 05/24/2021 05/27/2021   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 05/24/2021 05/27/2021   Acute blood loss anemia    Protein-calorie malnutrition, severe 05/23/2021   SBO (small bowel obstruction) ([place]) 05/19/2021   Acute upper GI bleed 08/02/2020   Prolonged QT interval 08/02/2020   Upper GI bleed 08/02/2020   Hematemesis with nausea    Chronic anticoagulation    Tobacco dependence 05/28/2020   Muscle spasm of back 36/14/4315   Chronic systolic CHF (congestive heart failure) ([place]) 09/11/2019   Hypoxia 06/02/2018   Symptomatic anemia    Chronic heart failure with preserved ejection fraction ([place])    Colon cancer screening 07/16/2017   Encounter for therapeutic drug monitoring 12/29/2015   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  Permanent atrial fibrillation ([place]) 12/24/2015   Cough with sputum 10/25/2015   Muscle tightness-Right arm  05/27/2014   Arm pain, right 05/06/2014   ESRD on HD TTS 05/06/2014   Chest pain 01/13/2014   Vitamin D deficiency 09/25/2013   CKD (chronic kidney disease), stage IV (HCC) 09/25/2013   Elevated uric acid in blood 09/25/2013   Hb-SS disease without crisis ([place]) 09/25/2013   Anemia 09/25/2013   Onychomycosis of toenail 08/27/2013   Hyperglycemia 08/27/2013   CHF (congestive heart failure) ([place]) 08/19/2013    Nausea & vomiting 02/07/2013   Sickle cell anemia ([place]) 11/21/2012   Abdominal pain 01/17/2012   NSVT (nonsustained ventricular tachycardia) ([place]) 10/21/2011   Gout 10/19/2011   Sickle-cell thalassemia with crisis ([place]) 10/17/2011   Chronic combined systolic and diastolic CHF (congestive heart failure) ([place]) 10/17/2011   Essential hypertension 06/07/2011   Home Medication(s) Prior to Admission medications   Medication Sig Start Date End Date Taking? Authorizing Provider  acetaminophen (TYLENOL) 500 MG tablet Take 1,000 mg by mouth every 6 (six) hours as needed for moderate pain.    [provider]  allopurinol (ZYLOPRIM) 100 MG tablet TAKE 1 TABLET BY MOUTH EVERY DAY 02/08/22   Fenton Foy, NP  AURYXIA 1 GM 210 MG(Fe) tablet Take 210 mg by mouth 3 (three) times daily. 02/11/21   [provider]  fluticasone (FLONASE) 50 MCG/ACT nasal spray Place 2 sprays into both nostrils daily. Patient taking differently: Place 2 sprays into both nostrils daily as needed. 09/04/21   Bo Merino I, NP  folic acid (FOLVITE) 1 MG tablet TAKE 1 TABLET BY MOUTH EVERY DAY 02/22/21   Dorena Dew, FNP  hydroxyurea (HYDREA) 500 MG capsule Take 1 capsule (500 mg total) by mouth daily. 11/08/21   Dorena Dew, FNP  lidocaine-prilocaine (EMLA) cream Apply 1 application. topically Every Tuesday,Thursday,and Saturday with dialysis. 09/08/19   [provider]  Nutritional Supplements (,FEEDING SUPPLEMENT, PROSOURCE PLUS) liquid Take 30 mLs by mouth 2 (two) times daily between meals. 08/30/21   Terrilee Croak, MD  Oxycodone HCl 10 MG TABS Take 1 tablet (10 mg total) by mouth every 6 (six) hours as needed. 03/27/22   Dorena Dew, FNP  pantoprazole (PROTONIX) 40 MG tablet TAKE 1 TABLET BY MOUTH TWICE A DAY 01/15/22   Willia Craze, NP  tiZANidine (ZANAFLEX) 4 MG tablet TAKE 1/2 TABLET (2MG  TOTAL) BY MOUTH EVERY 8 HOURS AS NEEDED FOR MUSCLE SPASM 03/27/22   Dorena Dew, FNP   torsemide (DEMADEX) 20 MG tablet Take 20 mg by mouth in the morning, at noon, in the evening, and at bedtime. 09/03/21   [provider]  Vitamin D, Ergocalciferol, (DRISDOL) 1.25 MG (50000 UNIT) CAPS capsule Take 1 capsule (50,000 Units total) by mouth once a week. Monday 11/08/21   Dorena Dew, FNP  Past Surgical History Past Surgical History:  Procedure Laterality Date   A/V FISTULAGRAM Right 07/29/2020   Procedure: A/V FISTULAGRAM - Right Arm;  Surgeon: Angelia Mould, MD;  Location: [place] [facility];  Service: Cardiovascular;  Laterality: Right;   BASCILIC VEIN TRANSPOSITION Right 04/12/2014   Procedure: BASILIC VEIN TRANSPOSITION;  Surgeon: Elam Dutch, MD;  Location: [place];  Service: Vascular;  Laterality: Right;   BIOPSY  08/04/2020   Procedure: BIOPSY;  Surgeon: Ladene Artist, MD;  Location: [place];  Service: Gastroenterology;;   BIOPSY  06/28/2021   Procedure: BIOPSY;  Surgeon: Jerene Bears, MD;  Location: [facility] ENDOSCOPY;  Service: Gastroenterology;;   BOWEL RESECTION N/A 05/24/2021   Procedure: SMALL BOWEL RESECTION;  Surgeon: Rolm Bookbinder, MD;  Location: [place];  Service: General;  Laterality: N/A;   CARDIOVERSION N/A 06/05/2018   Procedure: CARDIOVERSION;  Surgeon: Jolaine Artist, MD;  Location: [place];  Service: Cardiovascular;  Laterality: N/A;   CARDIOVERSION N/A 10/06/2019   Procedure: CARDIOVERSION;  Surgeon: Jolaine Artist, MD;  Location: [facility] ENDOSCOPY;  Service: Cardiovascular;  Laterality: N/A;   CHOLECYSTECTOMY N/A 05/24/2021   Procedure: OPEN CHOLECYSTECTOMY;  Surgeon: Rolm Bookbinder, MD;  Location: [place];  Service: General;  Laterality: N/A;   COLONOSCOPY WITH PROPOFOL N/A 09/24/2017   Procedure: COLONOSCOPY WITH PROPOFOL;  Surgeon: Jackquline Denmark, MD;  Location: [place];   Service: Endoscopy;  Laterality: N/A;   ESOPHAGOGASTRODUODENOSCOPY N/A 09/19/2017   Procedure: ESOPHAGOGASTRODUODENOSCOPY (EGD);  Surgeon: Jackquline Denmark, MD;  Location: [facility] ENDOSCOPY;  Service: Endoscopy;  Laterality: N/A;   ESOPHAGOGASTRODUODENOSCOPY N/A 05/29/2021   Procedure: ESOPHAGOGASTRODUODENOSCOPY (EGD);  Surgeon: Milus Banister, MD;  Location: [facility] ENDOSCOPY;  Service: Gastroenterology;  Laterality: N/A;   ESOPHAGOGASTRODUODENOSCOPY (EGD) WITH PROPOFOL N/A 08/04/2020   Procedure: ESOPHAGOGASTRODUODENOSCOPY (EGD) WITH PROPOFOL;  Surgeon: Ladene Artist, MD;  Location: [place];  Service: Gastroenterology;  Laterality: N/A;   ESOPHAGOGASTRODUODENOSCOPY (EGD) WITH PROPOFOL N/A 06/28/2021   Procedure: ESOPHAGOGASTRODUODENOSCOPY (EGD) WITH PROPOFOL;  Surgeon: Jerene Bears, MD;  Location: [facility] ENDOSCOPY;  Service: Gastroenterology;  Laterality: N/A;   ESOPHAGOGASTRODUODENOSCOPY (EGD) WITH PROPOFOL N/A 08/10/2021   Procedure: ESOPHAGOGASTRODUODENOSCOPY (EGD) WITH PROPOFOL;  Surgeon: Jackquline Denmark, MD;  Location: [place];  Service: Gastroenterology;  Laterality: N/A;   ESOPHAGOGASTRODUODENOSCOPY (EGD) WITH PROPOFOL N/A 08/02/2021   Procedure: ESOPHAGOGASTRODUODENOSCOPY (EGD) WITH PROPOFOL;  Surgeon: Lavena Bullion, DO;  Location: [place];  Service: Gastroenterology;  Laterality: N/A;   HEMOSTASIS CLIP PLACEMENT  05/29/2021   Procedure: HEMOSTASIS CLIP PLACEMENT;  Surgeon: Milus Banister, MD;  Location: [facility] ENDOSCOPY;  Service: Gastroenterology;;   HOT HEMOSTASIS N/A 05/29/2021   Procedure: HOT HEMOSTASIS (ARGON PLASMA COAGULATION/BICAP);  Surgeon: Milus Banister, MD;  Location: [facility] ENDOSCOPY;  Service: Gastroenterology;  Laterality: N/A;   HOT HEMOSTASIS N/A 06/28/2021   Procedure: HOT HEMOSTASIS (ARGON PLASMA COAGULATION/BICAP);  Surgeon: Jerene Bears, MD;  Location: [facility] ENDOSCOPY;  Service: Gastroenterology;  Laterality: N/A;   HOT HEMOSTASIS N/A 08/10/2021   Procedure: HOT  HEMOSTASIS (ARGON PLASMA COAGULATION/BICAP);  Surgeon: Jackquline Denmark, MD;  Location: [facility] ENDOSCOPY;  Service: Gastroenterology;  Laterality: N/A;   HOT HEMOSTASIS N/A 08/02/2021   Procedure: HOT HEMOSTASIS (ARGON PLASMA COAGULATION/BICAP);  Surgeon: Lavena Bullion, DO;  Location: [facility] ENDOSCOPY;  Service: Gastroenterology;  Laterality: N/A;   IR PERC TUN PERIT CATH WO PORT S&I /IMAG  05/23/2021   IR REMOVAL TUN CV CATH W/O FL  06/10/2021   IR US GUIDE VASC ACCESS RIGHT  05/23/2021  LAPAROSCOPY N/A 05/24/2021   Procedure: LAPAROSCOPY DIAGNOSTIC;  Surgeon: Rolm Bookbinder, MD;  Location: [place];  Service: General;  Laterality: N/A;   LAPAROTOMY N/A 05/24/2021   Procedure: EXPLORATORY LAPAROTOMY;  Surgeon: Rolm Bookbinder, MD;  Location: [place];  Service: General;  Laterality: N/A;   LEFT AND RIGHT HEART CATHETERIZATION WITH CORONARY ANGIOGRAM N/A 06/11/2011   Procedure: LEFT AND RIGHT HEART CATHETERIZATION WITH CORONARY ANGIOGRAM;  Surgeon: Larey Dresser, MD;  Location: [facility] CATH LAB;  Service: Cardiovascular;  Laterality: N/A;   PERIPHERAL VASCULAR BALLOON ANGIOPLASTY Right 07/29/2020   Procedure: PERIPHERAL VASCULAR BALLOON ANGIOPLASTY;  Surgeon: Angelia Mould, MD;  Location: [place] [facility];  Service: Cardiovascular;  Laterality: Right;  arm fistula   REVISON OF ARTERIOVENOUS FISTULA Right 08/08/2020   Procedure: ANEURYSM EXCISION OF RIGHT UPPER EXTREMITY ARTERIOVENOUS FISTULA;  Surgeon: Angelia Mould, MD;  Location: [place];  Service: Vascular;  Laterality: Right;   REVISON OF ARTERIOVENOUS FISTULA Right 03/21/2022   Procedure: EXCISION OF ULCERATED SKIN OF RIGHT ARTERIOVENOUS FISTULA REVISION;  Surgeon: Marty Heck, MD;  Location: [facility] OR;  Service: Vascular;  Laterality: Right;   SCLEROTHERAPY  05/29/2021   Procedure: Clide Deutscher;  Surgeon: Milus Banister, MD;  Location: [facility] ENDOSCOPY;  Service: Gastroenterology;;   Lavell Islam REMOVAL  05/29/2021   Procedure:  STENT REMOVAL;  Surgeon: Milus Banister, MD;  Location: [place];  Service: Gastroenterology;;   TEE WITHOUT CARDIOVERSION N/A 12/27/2015   Procedure: TRANSESOPHAGEAL ECHOCARDIOGRAM (TEE);  Surgeon: Sueanne Margarita, MD;  Location: [facility] ENDOSCOPY;  Service: Cardiovascular;  Laterality: N/A;   Family History Family History  Problem Relation Age of Onset   Alcohol abuse Mother    Liver disease Mother    Cirrhosis Mother    Heart attack Mother     Social History Social History   Tobacco Use   Smoking status: Former    Packs/day: 0.50    Years: 41.00    Total pack years: 20.50    Types: Cigarettes    Quit date: 12/08/2015    Years since quitting: 6.3   Smokeless tobacco: Never  Vaping Use   Vaping Use: Never used  Substance Use Topics   Alcohol use: Not Currently    Alcohol/week: 3.0 standard drinks of alcohol    Types: 1 Glasses of wine, 2 Cans of beer per week    Comment: none since starting dialysis 06/2021   Drug use: No   Allergies Patient has no known allergies.  Review of Systems Review of Systems  All other systems reviewed and are negative.  Physical Exam Vital Signs  I have reviewed the triage vital signs BP 97/61   Pulse 88   Temp 97.9 F (36.6 C) (Oral)   Resp 15   Ht 5\' 11"  (1.803 m)   SpO2 98%   BMI 21.06 kg/m  Physical Exam Vitals and nursing [name] reviewed.  Constitutional:      General: He is not in acute distress.    Appearance: Normal appearance.  HENT:     Mouth/Throat:     Mouth: Mucous membranes are moist.  Eyes:     Conjunctiva/sclera: Conjunctivae normal.  Cardiovascular:     Rate and Rhythm: Normal rate and regular rhythm.  Pulmonary:     Effort: Pulmonary effort is normal. No respiratory distress.     Breath sounds: Normal breath sounds.  Abdominal:     General: Abdomen is flat.     Palpations: Abdomen is soft.  Tenderness: There is no abdominal tenderness.  Musculoskeletal:     Right lower leg: No edema.     Left lower  leg: No edema.  Skin:    General: Skin is warm and dry.     Capillary Refill: Capillary refill takes less than 2 seconds.  Neurological:     Mental Status: He is alert and oriented to person, place, and time. Mental status is at baseline.  Psychiatric:        Mood and Affect: Mood normal.        Behavior: Behavior normal.     ED Results and Treatments Labs (all labs ordered are listed, but only abnormal results are displayed) Labs Reviewed  COMPREHENSIVE METABOLIC PANEL - Abnormal; Notable for the following components:      Result Value   Sodium 132 (*)    Chloride 89 (*)    CO2 19 (*)    BUN 52 (*)    Creatinine, Ser 7.35 (*)    AST 79 (*)    Alkaline Phosphatase 487 (*)    Total Bilirubin 2.2 (*)    GFR, Estimated 8 (*)    Anion gap 24 (*)    All other components within normal limits  LIPASE, BLOOD - Abnormal; Notable for the following components:   Lipase 93 (*)    All other components within normal limits  CBC WITH DIFFERENTIAL/PLATELET - Abnormal; Notable for the following components:   RBC 1.52 (*)    Hemoglobin 6.3 (*)    HCT 16.9 (*)    MCV 111.2 (*)    MCH 41.4 (*)    MCHC 37.3 (*)    RDW 17.4 (*)    nRBC 59.4 (*)    Lymphs Abs 0.5 (*)    nRBC 29 (*)    Abs Immature Granulocytes 0.30 (*)    All other components within normal limits  RETICULOCYTES - Abnormal; Notable for the following components:   Retic Ct Pct 6.7 (*)    RBC. 1.51 (*)    Immature Retic Fract 30.3 (*)    All other components within normal limits  PROTIME-INR - Abnormal; Notable for the following components:   Prothrombin Time 17.2 (*)    INR 1.4 (*)    All other components within normal limits  BRAIN NATRIURETIC PEPTIDE - Abnormal; Notable for the following components:   B Natriuretic Peptide 3,312.5 (*)    All other components within normal limits  TROPONIN I (HIGH SENSITIVITY) - Abnormal; Notable for the following components:   Troponin I (High Sensitivity) 427 (*)    All other  components within normal limits  RESP PANEL BY RT-PCR (FLU A&B, COVID) ARPGX2  CULTURE, BLOOD (ROUTINE X 2)  CULTURE, BLOOD (ROUTINE X 2)  LACTIC ACID, PLASMA  LACTIC ACID, PLASMA  BETA-HYDROXYBUTYRIC ACID  I-STAT VENOUS BLOOD GAS, ED  TROPONIN I (HIGH SENSITIVITY)  Radiology DG Chest Port 1 View  Result Date: 03/31/2022 CLINICAL DATA:  Cough, sickle crisis EXAM: PORTABLE CHEST 1 VIEW COMPARISON:  12/21/2021 FINDINGS: Cardiomegaly. Both lungs are clear. The visualized skeletal structures are unremarkable. IMPRESSION: Cardiomegaly without acute abnormality of the lungs in AP portable projection. Electronically Signed   By: Delanna Ahmadi M.D.   On: 03/31/2022 15:50    Pertinent labs & imaging results that were available during my care of the patient were reviewed by me and considered in my medical decision making (see MDM for details).  Medications Ordered in ED Medications  iohexol (OMNIPAQUE) 300 MG/ML solution 100 mL (has no administration in time range)  HYDROmorphone (DILAUDID) injection 1 mg (has no administration in time range)  aspirin EC tablet 325 mg (has no administration in time range)  HYDROmorphone (DILAUDID) injection 1 mg (1 mg Intravenous Given 03/31/22 1444)  lactated ringers bolus 500 mL (500 mLs Intravenous New Bag/Given 03/31/22 1445)  lactated ringers bolus 500 mL (500 mLs Intravenous New Bag/Given 03/31/22 1552)                                                                                                                                     Procedures .Critical Care  Performed by: Cristie Hem, MD Authorized by: Cristie Hem, MD   Critical care provider statement:    Critical care time (minutes):  30   Critical care end time:  03/31/2022 4:09 PM   Critical care time was exclusive of:  Separately billable procedures and  treating other patients   Critical care was necessary to treat or prevent imminent or life-threatening deterioration of the following conditions:  Cardiac failure, metabolic crisis, dehydration and respiratory failure   Critical care was time spent personally by me on the following activities:  Development of treatment plan with patient or surrogate, discussions with consultants, evaluation of patient's response to treatment, examination of patient, ordering and review of laboratory studies, ordering and review of radiographic studies, ordering and performing treatments and interventions, pulse oximetry, re-evaluation of patient's condition and review of old charts   (including critical care time)  Medical Decision Making / ED Course   MDM:  66 year old male presenting to the emergency department with sickle cell pain crisis and generalized weakness.  Patient overall well-appearing, vital signs reassuring, slight hypotension within normal limits.  No focal exam findings.  Suspect pain is related to chronic sickle cell pain crisis, given that it is unchanged from his prior pain.  However he does have new abdominal pain, and cough as well as nausea.  Differential includes sickle cell pain crisis, perforation, obstruction, appendicitis, cholecystitis, pancreatitis, volvulus.  Will obtain CT imaging.  Laboratory testing notable for reassuring reticulocyte count, doubt aplastic crisis.  Laboratory testing also reveals significant anion gap acidosis, patient not diabetic so doubt DKA, will obtain lactate and beta hydroxybutyrate, could be occult infectious process, versus possible starvation ketoacidosis.  We will also obtain blood cultures given significant lab abnormalities.  Patient also with significantly elevated troponin, patient has absolutely no chest pain at this time and he denies any recent chest pain, possibly demand related in the setting of new anemia, will discuss with cardiology, give  aspirin, and trend.  Given significant lab abnormalities, patient will need to be admitted, possible transfusion.  Agreeable to this       Additional history obtained: -External records from outside source obtained and reviewed including: Chart review including previous notes, labs, imaging, consultation notes including recent op [name] for fistula revision   Lab Tests: -I ordered, reviewed, and interpreted labs.   The pertinent results include:   Labs Reviewed  COMPREHENSIVE METABOLIC PANEL - Abnormal; Notable for the following components:      Result Value   Sodium 132 (*)    Chloride 89 (*)    CO2 19 (*)    BUN 52 (*)    Creatinine, Ser 7.35 (*)    AST 79 (*)    Alkaline Phosphatase 487 (*)    Total Bilirubin 2.2 (*)    GFR, Estimated 8 (*)    Anion gap 24 (*)    All other components within normal limits  LIPASE, BLOOD - Abnormal; Notable for the following components:   Lipase 93 (*)    All other components within normal limits  CBC WITH DIFFERENTIAL/PLATELET - Abnormal; Notable for the following components:   RBC 1.52 (*)    Hemoglobin 6.3 (*)    HCT 16.9 (*)    MCV 111.2 (*)    MCH 41.4 (*)    MCHC 37.3 (*)    RDW 17.4 (*)    nRBC 59.4 (*)    Lymphs Abs 0.5 (*)    nRBC 29 (*)    Abs Immature Granulocytes 0.30 (*)    All other components within normal limits  RETICULOCYTES - Abnormal; Notable for the following components:   Retic Ct Pct 6.7 (*)    RBC. 1.51 (*)    Immature Retic Fract 30.3 (*)    All other components within normal limits  PROTIME-INR - Abnormal; Notable for the following components:   Prothrombin Time 17.2 (*)    INR 1.4 (*)    All other components within normal limits  BRAIN NATRIURETIC PEPTIDE - Abnormal; Notable for the following components:   B Natriuretic Peptide 3,312.5 (*)    All other components within normal limits  TROPONIN I (HIGH SENSITIVITY) - Abnormal; Notable for the following components:   Troponin I (High Sensitivity) 427 (*)     All other components within normal limits  RESP PANEL BY RT-PCR (FLU A&B, COVID) ARPGX2  CULTURE, BLOOD (ROUTINE X 2)  CULTURE, BLOOD (ROUTINE X 2)  LACTIC ACID, PLASMA  LACTIC ACID, PLASMA  BETA-HYDROXYBUTYRIC ACID  I-STAT VENOUS BLOOD GAS, ED  TROPONIN I (HIGH SENSITIVITY)    Notable for elevated troponin, elevated BNP, elevated anion gap  EKG   EKG Interpretation  Date/Time:  Saturday March 31 2022 13:48:44 EDT Ventricular Rate:  84 PR Interval:    QRS Duration: 100 QT Interval:  469 QTC Calculation: 555 R Axis:   95 Text Interpretation: Atrial fibrillation Right axis deviation Nonspecific T abnormalities, lateral leads Prolonged QT interval Confirmed by Garnette Gunner [phone]) on 03/31/2022 3:33:52 PM         Imaging Studies ordered: I ordered imaging studies including CTA chest, CT abd On my interpretation imaging demonstrates x-ray with clear lungs. I independently  visualized and interpreted imaging. I agree with the radiologist interpretation   Medicines ordered and prescription drug management: Meds ordered this encounter  Medications   HYDROmorphone (DILAUDID) injection 1 mg   lactated ringers bolus 500 mL   iohexol (OMNIPAQUE) 300 MG/ML solution 100 mL   DISCONTD: sodium chloride 0.9 % bolus 1,000 mL   lactated ringers bolus 500 mL   HYDROmorphone (DILAUDID) injection 1 mg   aspirin EC tablet 325 mg    -I have reviewed the patients home medicines and have made adjustments as needed   Consultations Obtained: I requested consultation with the cardiologist,  and discussed lab and imaging findings as well as pertinent plan - they recommend: likely demand, recommend medical treatment of sickle cell disease and cardiology will follow and consider further evaluation once symptoms improve   Cardiac Monitoring: The patient was maintained on a cardiac monitor.  I personally viewed and interpreted the cardiac monitored which showed an underlying rhythm  of: afib  Social Determinants of Health:  Diagnosis or treatment significantly limited by social determinants of health: blindness   Reevaluation: After the interventions noted above, I reevaluated the patient and found that they have improved  Co morbidities that complicate the patient evaluation  Past Medical History:  Diagnosis Date   A-fib ([place])    Blood dyscrasia    Blood transfusion    "I've had a bunch of them"   CHF (congestive heart failure) ([place])    followed by hf clinic   Chronic kidney disease (CKD), stage III (moderate) ([place])    T-TH-S   Dialysis patient ([place]) 04/2019   AV fistula (right arm)   Dysrhythmia    A-Fib per patient   Gout    Headache(784.0)    "probably weekly" (01/13/2014)   Legally blind    Mitral regurgitation    Pulmonary hypertension ([place])    Shortness of breath dyspnea    Sickle cell disease, type SS (HCC)    Swelling abdomen    Swelling of extremity, left    Swelling of extremity, right    Tricuspid regurgitation       Dispostion: Disposition decision including need for hospitalization was considered, and patient admitted to the hospital.    Final Clinical Impression(s) / ED Diagnoses Final diagnoses:  Sickle cell pain crisis ([place])  High anion gap metabolic acidosis  Elevated troponin  End stage renal disease ([place])     This chart was dictated using voice recognition software.  Despite best efforts to proofread,  errors can occur which can change the documentation meaning.    Cristie Hem, MD 03/31/22 1610

## 2022-03-31 NOTE — Progress Notes (Signed)
  TRH will assume care on arrival to accepting facility. Until arrival, care as per EDP. However, [facility] available 24/7 for questions and assistance.   Nursing staff please page TRH Admits and Consults ([phone]) as soon as the patient arrives to the hospital.  Shannen Flansburg, DO [facility]  

## 2022-04-01 ENCOUNTER — Encounter (HOSPITAL_COMMUNITY): Payer: Self-pay | Admitting: Internal Medicine

## 2022-04-01 DIAGNOSIS — N186 End stage renal disease: Secondary | ICD-10-CM

## 2022-04-01 DIAGNOSIS — R188 Other ascites: Secondary | ICD-10-CM

## 2022-04-01 DIAGNOSIS — K2289 Other specified disease of esophagus: Secondary | ICD-10-CM | POA: Diagnosis not present

## 2022-04-01 DIAGNOSIS — R6881 Early satiety: Secondary | ICD-10-CM

## 2022-04-01 DIAGNOSIS — I272 Pulmonary hypertension, unspecified: Secondary | ICD-10-CM | POA: Diagnosis present

## 2022-04-01 DIAGNOSIS — Z992 Dependence on renal dialysis: Secondary | ICD-10-CM

## 2022-04-01 DIAGNOSIS — K863 Pseudocyst of pancreas: Secondary | ICD-10-CM | POA: Diagnosis present

## 2022-04-01 DIAGNOSIS — I4819 Other persistent atrial fibrillation: Secondary | ICD-10-CM

## 2022-04-01 DIAGNOSIS — I428 Other cardiomyopathies: Secondary | ICD-10-CM | POA: Diagnosis present

## 2022-04-01 DIAGNOSIS — K839 Disease of biliary tract, unspecified: Secondary | ICD-10-CM | POA: Diagnosis not present

## 2022-04-01 DIAGNOSIS — I4821 Permanent atrial fibrillation: Secondary | ICD-10-CM | POA: Diagnosis present

## 2022-04-01 DIAGNOSIS — D7589 Other specified diseases of blood and blood-forming organs: Secondary | ICD-10-CM | POA: Diagnosis present

## 2022-04-01 DIAGNOSIS — K449 Diaphragmatic hernia without obstruction or gangrene: Secondary | ICD-10-CM | POA: Diagnosis not present

## 2022-04-01 DIAGNOSIS — N2581 Secondary hyperparathyroidism of renal origin: Secondary | ICD-10-CM | POA: Diagnosis present

## 2022-04-01 DIAGNOSIS — R195 Other fecal abnormalities: Secondary | ICD-10-CM | POA: Diagnosis not present

## 2022-04-01 DIAGNOSIS — R7989 Other specified abnormal findings of blood chemistry: Secondary | ICD-10-CM | POA: Diagnosis not present

## 2022-04-01 DIAGNOSIS — R1010 Upper abdominal pain, unspecified: Secondary | ICD-10-CM | POA: Diagnosis not present

## 2022-04-01 DIAGNOSIS — K862 Cyst of pancreas: Secondary | ICD-10-CM | POA: Diagnosis not present

## 2022-04-01 DIAGNOSIS — D57419 Sickle-cell thalassemia with crisis, unspecified: Secondary | ICD-10-CM | POA: Diagnosis present

## 2022-04-01 DIAGNOSIS — J449 Chronic obstructive pulmonary disease, unspecified: Secondary | ICD-10-CM | POA: Diagnosis present

## 2022-04-01 DIAGNOSIS — R058 Other specified cough: Secondary | ICD-10-CM | POA: Diagnosis not present

## 2022-04-01 DIAGNOSIS — E872 Acidosis, unspecified: Secondary | ICD-10-CM

## 2022-04-01 DIAGNOSIS — I5022 Chronic systolic (congestive) heart failure: Secondary | ICD-10-CM

## 2022-04-01 DIAGNOSIS — K761 Chronic passive congestion of liver: Secondary | ICD-10-CM | POA: Diagnosis present

## 2022-04-01 DIAGNOSIS — K3189 Other diseases of stomach and duodenum: Secondary | ICD-10-CM | POA: Diagnosis not present

## 2022-04-01 DIAGNOSIS — K254 Chronic or unspecified gastric ulcer with hemorrhage: Secondary | ICD-10-CM | POA: Diagnosis present

## 2022-04-01 DIAGNOSIS — Z87891 Personal history of nicotine dependence: Secondary | ICD-10-CM | POA: Diagnosis not present

## 2022-04-01 DIAGNOSIS — D5700 Hb-SS disease with crisis, unspecified: Secondary | ICD-10-CM

## 2022-04-01 DIAGNOSIS — Z66 Do not resuscitate: Secondary | ICD-10-CM | POA: Diagnosis present

## 2022-04-01 DIAGNOSIS — E871 Hypo-osmolality and hyponatremia: Secondary | ICD-10-CM | POA: Diagnosis present

## 2022-04-01 DIAGNOSIS — G894 Chronic pain syndrome: Secondary | ICD-10-CM | POA: Diagnosis present

## 2022-04-01 DIAGNOSIS — R778 Other specified abnormalities of plasma proteins: Secondary | ICD-10-CM | POA: Diagnosis not present

## 2022-04-01 DIAGNOSIS — K2961 Other gastritis with bleeding: Secondary | ICD-10-CM | POA: Diagnosis present

## 2022-04-01 DIAGNOSIS — I132 Hypertensive heart and chronic kidney disease with heart failure and with stage 5 chronic kidney disease, or end stage renal disease: Secondary | ICD-10-CM | POA: Diagnosis present

## 2022-04-01 DIAGNOSIS — Z1152 Encounter for screening for COVID-19: Secondary | ICD-10-CM | POA: Diagnosis not present

## 2022-04-01 DIAGNOSIS — Z4659 Encounter for fitting and adjustment of other gastrointestinal appliance and device: Secondary | ICD-10-CM | POA: Diagnosis not present

## 2022-04-01 DIAGNOSIS — K838 Other specified diseases of biliary tract: Secondary | ICD-10-CM | POA: Diagnosis not present

## 2022-04-01 DIAGNOSIS — K668 Other specified disorders of peritoneum: Secondary | ICD-10-CM | POA: Diagnosis not present

## 2022-04-01 DIAGNOSIS — I081 Rheumatic disorders of both mitral and tricuspid valves: Secondary | ICD-10-CM | POA: Diagnosis present

## 2022-04-01 DIAGNOSIS — D631 Anemia in chronic kidney disease: Secondary | ICD-10-CM | POA: Diagnosis present

## 2022-04-01 DIAGNOSIS — R9431 Abnormal electrocardiogram [ECG] [EKG]: Secondary | ICD-10-CM

## 2022-04-01 DIAGNOSIS — R1013 Epigastric pain: Secondary | ICD-10-CM

## 2022-04-01 DIAGNOSIS — I10 Essential (primary) hypertension: Secondary | ICD-10-CM

## 2022-04-01 DIAGNOSIS — E876 Hypokalemia: Secondary | ICD-10-CM | POA: Diagnosis not present

## 2022-04-01 HISTORY — DX: Acidosis, unspecified: E87.20

## 2022-04-01 HISTORY — DX: Other specified abnormal findings of blood chemistry: R79.89

## 2022-04-01 LAB — CBC WITH DIFFERENTIAL/PLATELET
Abs Immature Granulocytes: 0.53 10*3/uL — ABNORMAL HIGH (ref 0.00–0.07)
Basophils Absolute: 0 10*3/uL (ref 0.0–0.1)
Basophils Relative: 0 %
Eosinophils Absolute: 0.1 10*3/uL (ref 0.0–0.5)
Eosinophils Relative: 2 %
HCT: 16.4 % — ABNORMAL LOW (ref 39.0–52.0)
Hemoglobin: 6.1 g/dL — CL (ref 13.0–17.0)
Immature Granulocytes: 7 %
Lymphocytes Relative: 1 %
Lymphs Abs: 0.1 10*3/uL — ABNORMAL LOW (ref 0.7–4.0)
MCH: 42.1 pg — ABNORMAL HIGH (ref 26.0–34.0)
MCHC: 37.2 g/dL — ABNORMAL HIGH (ref 30.0–36.0)
MCV: 113.1 fL — ABNORMAL HIGH (ref 80.0–100.0)
Monocytes Absolute: 1.2 10*3/uL — ABNORMAL HIGH (ref 0.1–1.0)
Monocytes Relative: 15 %
Neutro Abs: 5.9 10*3/uL (ref 1.7–7.7)
Neutrophils Relative %: 75 %
Platelets: 174 10*3/uL (ref 150–400)
RBC: 1.45 MIL/uL — ABNORMAL LOW (ref 4.22–5.81)
RDW: 18 % — ABNORMAL HIGH (ref 11.5–15.5)
Smear Review: NORMAL
WBC: 7.8 10*3/uL (ref 4.0–10.5)
nRBC: 43.2 % — ABNORMAL HIGH (ref 0.0–0.2)

## 2022-04-01 LAB — PHOSPHORUS: Phosphorus: 6.4 mg/dL — ABNORMAL HIGH (ref 2.5–4.6)

## 2022-04-01 LAB — PREPARE RBC (CROSSMATCH)

## 2022-04-01 LAB — PROCALCITONIN: Procalcitonin: >150 ng/mL

## 2022-04-01 MED ORDER — ASPIRIN 325 MG PO TABS
325.0000 mg | ORAL_TABLET | Freq: Every day | ORAL | Status: DC
  Administered 2022-04-01 – 2022-04-02 (×2): 325 mg via ORAL
  Filled 2022-04-01 (×2): qty 1

## 2022-04-01 MED ORDER — SODIUM CHLORIDE 0.9 % IV SOLN
62.5000 mg | INTRAVENOUS | Status: DC
  Administered 2022-04-03: 62.5 mg via INTRAVENOUS
  Filled 2022-04-01 (×2): qty 5

## 2022-04-01 MED ORDER — SODIUM CHLORIDE 0.9% FLUSH
3.0000 mL | Freq: Two times a day (BID) | INTRAVENOUS | Status: DC
  Administered 2022-04-01 – 2022-04-08 (×13): 3 mL via INTRAVENOUS

## 2022-04-01 MED ORDER — HYDROXYUREA 500 MG PO CAPS
500.0000 mg | ORAL_CAPSULE | Freq: Every day | ORAL | Status: DC
  Administered 2022-04-01 – 2022-04-08 (×7): 500 mg via ORAL
  Filled 2022-04-01 (×9): qty 1

## 2022-04-01 MED ORDER — SODIUM CHLORIDE 0.9% IV SOLUTION: Freq: Once | INTRAVENOUS | Status: AC

## 2022-04-01 MED ORDER — ALBUTEROL SULFATE (2.5 MG/3ML) 0.083% IN NEBU: 2.5000 mg | INHALATION_SOLUTION | Freq: Four times a day (QID) | RESPIRATORY_TRACT | Status: DC | PRN

## 2022-04-01 MED ORDER — POLYVINYL ALCOHOL 1.4 % OP SOLN
1.0000 [drp] | OPHTHALMIC | Status: DC | PRN
  Administered 2022-04-01: 1 [drp] via OPHTHALMIC
  Filled 2022-04-01: qty 15

## 2022-04-01 MED ORDER — DOXERCALCIFEROL 4 MCG/2ML IV SOLN
7.0000 ug | INTRAVENOUS | Status: DC
  Administered 2022-04-03 – 2022-04-07 (×3): 7 ug via INTRAVENOUS
  Filled 2022-04-01 (×6): qty 4

## 2022-04-01 MED ORDER — TRIMETHOBENZAMIDE HCL 100 MG/ML IM SOLN: 200.0000 mg | Freq: Four times a day (QID) | INTRAMUSCULAR | Status: DC | PRN

## 2022-04-01 MED ORDER — TORSEMIDE 20 MG PO TABS
20.0000 mg | ORAL_TABLET | Freq: Four times a day (QID) | ORAL | Status: DC
  Administered 2022-04-01 – 2022-04-04 (×8): 20 mg via ORAL
  Filled 2022-04-01 (×10): qty 1

## 2022-04-01 MED ORDER — OXYCODONE HCL 5 MG PO TABS
10.0000 mg | ORAL_TABLET | Freq: Two times a day (BID) | ORAL | Status: DC | PRN
  Administered 2022-04-01 – 2022-04-07 (×5): 10 mg via ORAL
  Filled 2022-04-01 (×6): qty 2

## 2022-04-01 MED ORDER — ACETAMINOPHEN 650 MG RE SUPP: 650.0000 mg | Freq: Four times a day (QID) | RECTAL | Status: DC | PRN

## 2022-04-01 MED ORDER — CINACALCET HCL 30 MG PO TABS
30.0000 mg | ORAL_TABLET | ORAL | Status: DC
  Administered 2022-04-03 – 2022-04-05 (×2): 30 mg via ORAL
  Filled 2022-04-01 (×2): qty 1

## 2022-04-01 MED ORDER — ACETAMINOPHEN 325 MG PO TABS
650.0000 mg | ORAL_TABLET | Freq: Four times a day (QID) | ORAL | Status: DC | PRN
  Filled 2022-04-01: qty 2

## 2022-04-01 MED ORDER — HYDROMORPHONE HCL 1 MG/ML IJ SOLN
1.0000 mg | Freq: Once | INTRAMUSCULAR | Status: AC
  Administered 2022-04-01: 1 mg via INTRAVENOUS
  Filled 2022-04-01: qty 1

## 2022-04-01 MED ORDER — DARBEPOETIN ALFA 200 MCG/0.4ML IJ SOSY
200.0000 ug | PREFILLED_SYRINGE | INTRAMUSCULAR | Status: DC
  Administered 2022-04-03: 200 ug via INTRAVENOUS
  Filled 2022-04-01 (×2): qty 0.4

## 2022-04-01 MED ORDER — HYDROMORPHONE HCL 1 MG/ML IJ SOLN
1.0000 mg | INTRAMUSCULAR | Status: DC | PRN
  Administered 2022-04-01 – 2022-04-08 (×40): 1 mg via INTRAVENOUS
  Filled 2022-04-01 (×41): qty 1

## 2022-04-01 NOTE — Consult Note (Signed)
Renal Service Consult Note [facility]  Patrick [name] 04/01/2022 [name], MD Requesting Physician: Dr. Harvest Forest  Reason for Consult: ESRD pt w/ abd pains, nausea, dry heaves HPI: The patient is a 66 y.o. year-old w/ hx of sickle cell disease, atrial fib, anemia, CHF, gout, pHTN, esrd on HD, chronic pain syndrome who presented to ED w/ c/o intermittent abd pain, nausea, dry heaves, gen weakness and loss of appetite for last few weeks. Has missed one HD which was yesterday 10/14. In ED pt HR and RR up, BP stable. Hb 6.1, creat 7, tbili 2.2, AST 79, trop 427 , 378. CT chest/ abd/ pelvis noted no PE, but did note a 25 cm cyst in the upper abdomen w/ CM, trace effusions. Pt got IV bolus fluids 500 cc, dilaudid and aspirin. Pt will be admitted. Asked to see for ESRD.    Pt seen in room. He notes that they were having to "pull 6 liters on me" at the OP HD unit, now his legs are "skinny" and they are not pulling any fluid, in fact he was below his dry wt on Thursday last week when he arrived and very little was removed. Certainly in the past it is true that he was usually volume overloaded w/ lots of LE edema. States a few wks ago he lost his appetite for food or drink, also w/ occ abd pains.  No fevers , chills, no RUQ pain.   ROS - denies CP, no joint pain, no HA, no blurry vision, no rash, no diarrhea, no nausea/ vomiting  Past Medical History  Past Medical History:  Diagnosis Date   A-fib ([place])    Blood dyscrasia    Blood transfusion    "I've had a bunch of them"   CHF (congestive heart failure) ([place])    followed by hf clinic   Chronic kidney disease (CKD), stage III (moderate) (HCC)    T-TH-S   Dialysis patient ([place]) 04/2019   AV fistula (right arm)   Dysrhythmia    A-Fib per patient   Gout    Headache(784.0)    "probably weekly" (01/13/2014)   Legally blind    Mitral regurgitation    Pulmonary hypertension (HCC)    Shortness of breath dyspnea    Sickle  cell disease, type SS (HCC)    Swelling abdomen    Swelling of extremity, left    Swelling of extremity, right    Tricuspid regurgitation    Past Surgical History  Past Surgical History:  Procedure Laterality Date   A/V FISTULAGRAM Right 07/29/2020   Procedure: A/V FISTULAGRAM - Right Arm;  Surgeon: Angelia Mould, MD;  Location: [place] CV [facility];  Service: Cardiovascular;  Laterality: Right;   BASCILIC VEIN TRANSPOSITION Right 04/12/2014   Procedure: BASILIC VEIN TRANSPOSITION;  Surgeon: Elam Dutch, MD;  Location: [place];  Service: Vascular;  Laterality: Right;   BIOPSY  08/04/2020   Procedure: BIOPSY;  Surgeon: Ladene Artist, MD;  Location: [place];  Service: Gastroenterology;;   BIOPSY  06/28/2021   Procedure: BIOPSY;  Surgeon: Jerene Bears, MD;  Location: [facility] ENDOSCOPY;  Service: Gastroenterology;;   BOWEL RESECTION N/A 05/24/2021   Procedure: SMALL BOWEL RESECTION;  Surgeon: Rolm Bookbinder, MD;  Location: [place];  Service: General;  Laterality: N/A;   CARDIOVERSION N/A 06/05/2018   Procedure: CARDIOVERSION;  Surgeon: Jolaine Artist, MD;  Location: [facility] ENDOSCOPY;  Service: Cardiovascular;  Laterality: N/A;   CARDIOVERSION N/A 10/06/2019  Procedure: CARDIOVERSION;  Surgeon: Jolaine Artist, MD;  Location: [facility] ENDOSCOPY;  Service: Cardiovascular;  Laterality: N/A;   CHOLECYSTECTOMY N/A 05/24/2021   Procedure: OPEN CHOLECYSTECTOMY;  Surgeon: Rolm Bookbinder, MD;  Location: [place];  Service: General;  Laterality: N/A;   COLONOSCOPY WITH PROPOFOL N/A 09/24/2017   Procedure: COLONOSCOPY WITH PROPOFOL;  Surgeon: Jackquline Denmark, MD;  Location: [facility] ENDOSCOPY;  Service: Endoscopy;  Laterality: N/A;   ESOPHAGOGASTRODUODENOSCOPY N/A 09/19/2017   Procedure: ESOPHAGOGASTRODUODENOSCOPY (EGD);  Surgeon: Jackquline Denmark, MD;  Location: [facility] ENDOSCOPY;  Service: Endoscopy;  Laterality: N/A;   ESOPHAGOGASTRODUODENOSCOPY N/A 05/29/2021   Procedure:  ESOPHAGOGASTRODUODENOSCOPY (EGD);  Surgeon: Milus Banister, MD;  Location: [facility] ENDOSCOPY;  Service: Gastroenterology;  Laterality: N/A;   ESOPHAGOGASTRODUODENOSCOPY (EGD) WITH PROPOFOL N/A 08/04/2020   Procedure: ESOPHAGOGASTRODUODENOSCOPY (EGD) WITH PROPOFOL;  Surgeon: Ladene Artist, MD;  Location: [place];  Service: Gastroenterology;  Laterality: N/A;   ESOPHAGOGASTRODUODENOSCOPY (EGD) WITH PROPOFOL N/A 06/28/2021   Procedure: ESOPHAGOGASTRODUODENOSCOPY (EGD) WITH PROPOFOL;  Surgeon: Jerene Bears, MD;  Location: [facility] ENDOSCOPY;  Service: Gastroenterology;  Laterality: N/A;   ESOPHAGOGASTRODUODENOSCOPY (EGD) WITH PROPOFOL N/A 08/10/2021   Procedure: ESOPHAGOGASTRODUODENOSCOPY (EGD) WITH PROPOFOL;  Surgeon: Jackquline Denmark, MD;  Location: [place];  Service: Gastroenterology;  Laterality: N/A;   ESOPHAGOGASTRODUODENOSCOPY (EGD) WITH PROPOFOL N/A 08/02/2021   Procedure: ESOPHAGOGASTRODUODENOSCOPY (EGD) WITH PROPOFOL;  Surgeon: Lavena Bullion, DO;  Location: [place];  Service: Gastroenterology;  Laterality: N/A;   HEMOSTASIS CLIP PLACEMENT  05/29/2021   Procedure: HEMOSTASIS CLIP PLACEMENT;  Surgeon: Milus Banister, MD;  Location: [facility] ENDOSCOPY;  Service: Gastroenterology;;   HOT HEMOSTASIS N/A 05/29/2021   Procedure: HOT HEMOSTASIS (ARGON PLASMA COAGULATION/BICAP);  Surgeon: Milus Banister, MD;  Location: [facility] ENDOSCOPY;  Service: Gastroenterology;  Laterality: N/A;   HOT HEMOSTASIS N/A 06/28/2021   Procedure: HOT HEMOSTASIS (ARGON PLASMA COAGULATION/BICAP);  Surgeon: Jerene Bears, MD;  Location: [facility] ENDOSCOPY;  Service: Gastroenterology;  Laterality: N/A;   HOT HEMOSTASIS N/A 08/10/2021   Procedure: HOT HEMOSTASIS (ARGON PLASMA COAGULATION/BICAP);  Surgeon: Jackquline Denmark, MD;  Location: [facility] ENDOSCOPY;  Service: Gastroenterology;  Laterality: N/A;   HOT HEMOSTASIS N/A 08/02/2021   Procedure: HOT HEMOSTASIS (ARGON PLASMA COAGULATION/BICAP);  Surgeon: Lavena Bullion, DO;   Location: [facility] ENDOSCOPY;  Service: Gastroenterology;  Laterality: N/A;   IR PERC TUN PERIT CATH WO PORT S&I /IMAG  05/23/2021   IR REMOVAL TUN CV CATH W/O FL  06/10/2021   IR US GUIDE VASC ACCESS RIGHT  05/23/2021   LAPAROSCOPY N/A 05/24/2021   Procedure: LAPAROSCOPY DIAGNOSTIC;  Surgeon: Rolm Bookbinder, MD;  Location: [place];  Service: General;  Laterality: N/A;   LAPAROTOMY N/A 05/24/2021   Procedure: EXPLORATORY LAPAROTOMY;  Surgeon: Rolm Bookbinder, MD;  Location: [place];  Service: General;  Laterality: N/A;   LEFT AND RIGHT HEART CATHETERIZATION WITH CORONARY ANGIOGRAM N/A 06/11/2011   Procedure: LEFT AND RIGHT HEART CATHETERIZATION WITH CORONARY ANGIOGRAM;  Surgeon: Larey Dresser, MD;  Location: [facility] CATH LAB;  Service: Cardiovascular;  Laterality: N/A;   PERIPHERAL VASCULAR BALLOON ANGIOPLASTY Right 07/29/2020   Procedure: PERIPHERAL VASCULAR BALLOON ANGIOPLASTY;  Surgeon: Angelia Mould, MD;  Location: [place] CV LAB;  Service: Cardiovascular;  Laterality: Right;  arm fistula   REVISON OF ARTERIOVENOUS FISTULA Right 08/08/2020   Procedure: ANEURYSM EXCISION OF RIGHT UPPER EXTREMITY ARTERIOVENOUS FISTULA;  Surgeon: Angelia Mould, MD;  Location: [place];  Service: Vascular;  Laterality: Right;   REVISON OF ARTERIOVENOUS FISTULA Right 03/21/2022   Procedure: EXCISION OF  ULCERATED SKIN OF RIGHT ARTERIOVENOUS FISTULA REVISION;  Surgeon: Marty Heck, MD;  Location: [place];  Service: Vascular;  Laterality: Right;   SCLEROTHERAPY  05/29/2021   Procedure: Clide Deutscher;  Surgeon: Milus Banister, MD;  Location: [facility] ENDOSCOPY;  Service: Gastroenterology;;   Lavell Islam REMOVAL  05/29/2021   Procedure: STENT REMOVAL;  Surgeon: Milus Banister, MD;  Location: [facility] ENDOSCOPY;  Service: Gastroenterology;;   TEE WITHOUT CARDIOVERSION N/A 12/27/2015   Procedure: TRANSESOPHAGEAL ECHOCARDIOGRAM (TEE);  Surgeon: Sueanne Margarita, MD;  Location: [facility] ENDOSCOPY;  Service: Cardiovascular;   Laterality: N/A;   Family History  Family History  Problem Relation Age of Onset   Alcohol abuse Mother    Liver disease Mother    Cirrhosis Mother    Heart attack Mother    Social History  reports that he quit smoking about 6 years ago. His smoking use included cigarettes. He has a 20.50 pack-year smoking history. He has never used smokeless tobacco. He reports that he does not currently use alcohol after a past usage of about 3.0 standard drinks of alcohol per week. He reports that he does not use drugs. Allergies No Known Allergies Home medications Prior to Admission medications   Medication Sig Start Date End Date Taking? Authorizing Provider  acetaminophen (TYLENOL) 500 MG tablet Take 2,000 mg by mouth in the morning, at noon, and at bedtime.   Yes [provider]  allopurinol (ZYLOPRIM) 100 MG tablet TAKE 1 TABLET BY MOUTH EVERY DAY Patient taking differently: Take 100 mg by mouth daily. 02/08/22  Yes Fenton Foy, NP  AURYXIA 1 GM 210 MG(Fe) tablet Take 210 mg by mouth in the morning and at bedtime. 02/11/21  Yes [provider]  Dextromethorphan-guaiFENesin [facility] COUGH & CHEST CONGEST PO) Take 1 Capful by mouth 3 (three) times daily as needed (cough).   Yes [provider]  diphenhydramine-acetaminophen (TYLENOL PM) 25-500 MG TABS tablet Take 2 tablets by mouth at bedtime as needed (sleep/pain).   Yes [provider]  folic acid (FOLVITE) 1 MG tablet TAKE 1 TABLET BY MOUTH EVERY DAY Patient taking differently: Take 1 mg by mouth daily. 02/22/21  Yes Dorena Dew, FNP  hydroxyurea (HYDREA) 500 MG capsule Take 1 capsule (500 mg total) by mouth daily. 11/08/21  Yes Dorena Dew, FNP  lidocaine-prilocaine (EMLA) cream Apply 1 application. topically Every Tuesday,Thursday,and Saturday with dialysis. 09/08/19  Yes [provider]  Oxycodone HCl 10 MG TABS Take 1 tablet (10 mg total) by mouth every 6 (six) hours as needed. Patient  taking differently: Take 10 mg by mouth 2 (two) times daily as needed (pain). 03/27/22  Yes Dorena Dew, FNP  pantoprazole (PROTONIX) 40 MG tablet TAKE 1 TABLET BY MOUTH TWICE A DAY Patient taking differently: Take 40 mg by mouth 2 (two) times daily. 01/15/22  Yes Willia Craze, NP  tiZANidine (ZANAFLEX) 4 MG tablet TAKE 1/2 TABLET (2MG  TOTAL) BY MOUTH EVERY 8 HOURS AS NEEDED FOR MUSCLE SPASM Patient taking differently: Take 2 mg by mouth 3 (three) times daily as needed for muscle spasms. 03/27/22  Yes Dorena Dew, FNP  torsemide (DEMADEX) 20 MG tablet Take 20 mg by mouth in the morning, at noon, in the evening, and at bedtime. 09/03/21  Yes [provider]  fluticasone (FLONASE) 50 MCG/ACT nasal spray Place 2 sprays into both nostrils daily. Patient not taking: Reported on 04/01/2022 09/04/21   Bo Merino I, NP  Nutritional Supplements (,FEEDING SUPPLEMENT, PROSOURCE PLUS)  liquid Take 30 mLs by mouth 2 (two) times daily between meals. Patient not taking: Reported on 04/01/2022 08/30/21   Terrilee Croak, MD  Vitamin D, Ergocalciferol, (DRISDOL) 1.25 MG (50000 UNIT) CAPS capsule Take 1 capsule (50,000 Units total) by mouth once a week. Monday Patient not taking: Reported on 04/01/2022 11/08/21   Dorena Dew, FNP     Vitals:   04/01/22 1242 04/01/22 1642 04/01/22 1755 04/01/22 1817  BP: 113/75 107/69 101/64 101/69  Pulse: 97 83 88 80  Resp: 18 18 18 17   Temp: 97.7 F (36.5 C) 98 F (36.7 C) 98.2 F (36.8 C) 98.2 F (36.8 C)  TempSrc: Oral Oral Oral Oral  SpO2: 98% 100% 97% 98%  Weight: 63.7 kg     Height: 5\' 11"  (1.803 m)      Exam Gen alert, no distress No rash, cyanosis or gangrene Sclera anicteric, throat clear  Flat neck veins Chest clear bilat to bases, no rales/ wheezing RRR no MRG Abd soft ntnd no mass or ascites +bs GU normal male MS no joint effusions or deformity Ext no LE or UE edema, no wounds or ulcers Neuro is alert, Ox 3 , nf     RUA aVF+bruit   Home meds include - hydroxyurea, oxycodone 10 bid, torsemide 20 mg qid, prns/ vits/ supps   OP HD: SW TTS 4h 85min  400/500   64kg  2/2.5 bath  P2  Hep none  RUA AVF - last HD 10/12, post wt 63.4kg (1.3 L removed) - last Hb 7.6 - mircera 225 q 2, last 10/3, due 10/17 - venofer 50 mg weekly - hectorol 7 ug iv tiw tts - sensipar 30 mg tiw po tts   Assessment/ Plan: Abd pain / N/ V - CT abd showed large 25 cm cyst-like structure which may be causing his pain, N/V. GI consulting.  ESRD - on HD TTS.  Missed HD yesterday, 10/14. Pt declines recommendation to dialyze tomorrow to make up for Sat HD. So the plan will be for next HD to be on Tuesday 10/17.  Sickle cell painful crisis - per pmd Marlena Clipper - cardiology consulting Atrial fib - per pmd /cards BP/ volume - CXR and CT chest do not show signs of fluid overload. On exam looks a bit dry. He is at dry wt. BP's low normal. Is not taking any BP lowering meds at home.   ^LFT"s - tbili 2.2, AST 79. Per pmd / GI      Patrick Lindel Marcell  MD 04/01/2022, 7:03 PM Recent Labs  Lab 03/31/22 1427 03/31/22 1731 04/01/22 1158  HGB 6.3* 7.8* 6.1*  ALBUMIN 3.8  --   --   CALCIUM 9.1  --   --   CREATININE 7.35*  --   --   K 3.5 3.7  --    Inpatient medications:  aspirin  325 mg Oral Daily   [START ON 04/03/2022] cinacalcet  30 mg Oral Q T,Th,Sa-HD   [START ON 04/03/2022] doxercalciferol  7 mcg Intravenous Q T,Th,Sa-HD   hydroxyurea  500 mg Oral Daily   sodium chloride flush  3 mL Intravenous Q12H   torsemide  20 mg Oral QID    [START ON 04/03/2022] ferric gluconate (FERRLECIT) IVPB     acetaminophen **OR** acetaminophen, albuterol, HYDROmorphone (DILAUDID) injection, oxyCODONE, trimethobenzamide

## 2022-04-01 NOTE — Consult Note (Addendum)
Consultation Note   Referring Provider: [facility] PCP: [name], MD Primary Gastroenterologist: Dr. Lyndel Safe Reason for consultation: intraabdominal cyst  Hospital Day: 2  Assessment / Plan    # 66 yo male admitted with generalized abdominal pain, nausea, dry heaves with CT scan showing a new bilobed cyst in the central upper abdomen exerting a mass effect on adjacent organs. Possibly a pseudocyst but seems to more associated with the liver and stomach  Dr. Loletha Carrow will review CT scan. At this point it isn't clear if this is a pseudocyst.. Need more advanced imaging needed?  Role for EUS with aspiration?   # Mildly elevated AST of 79, normal ALT of 28. Non-drinker. Hepatic congestion on CTscan but mild AST elevation may not even bere liver related  # Elevated troponin ( high sensitivity 427 >> 378). Cardiology to see.  # Generalized pain, ? Sickle cell related pain. Hgb 7.8 >> 6.1.  Tbili 2.2. No overt GI bleeding.    # Prolonged QT interval  #  ESRD on HD T/T/S   # History of choledocholithiasis status post stone extraction / sphincterotomy, PD and CBD stent placement  followed later by cholecystectomy Dec 2022  See PMH for additional medical problems   HPI   Patrick [name] is a 66 y.o. male with a past medical history significant for gout,  ESRD on HD T/TH/Sa, CHF, A-fib, sickle cell disease, pulmonary hypertension, legally blind, chronic anemia, gastric AVMs, choledocholithiasis status post stone extraction/sphincterotomy in followed by cholecystectomy Dec 2022, recurrent SBOs status post small bowel resection.   See PMH for any additional medical problems.  We have seen him several times for chronic GI bleeding / Anemia. . He has undergone numerous endoscopic evaluations as listed in our 1/10/ 23 hospital consult note. Since then he has undergone two more EGDs in February 2023.   Today we are asked to see  patient for a different problem. He presented to ED yesterday with his chronic sickle cell pain, especially pain in his legs. Additionally he has been having intermittent nausea and  generalized abdominal pain for several days. The generalized abdominal pain is crampy in nature and it has no relationship to food. He has never had this type of abdominal pain. He was having dry heaves last weeks, now with just intermittent nausea. He he hasn't had much of an appetite.  Large cyst in upper abdomen on CT scan, felt to be a pseudocysts. No history of pancreatitis.    Significant studies: Alk phos 487, chronically elevated.  Tbili 2.2 - chronically elevated Lipase 93 WBC 7.5 Hgb 6.1 CT scan w contrast >>25.6 cm bilobed cyst in the central upper abdomen, most commonly reflecting a pseudocyst, less likely a mesenteric cyst. This is new from the prior CT. Associated mass effect on adjacent organs.   Previous GI Evaluation    Multiple endoscopic evaluations.   Recent Labs and Imaging CT Abdomen Pelvis W Contrast  Result Date: 03/31/2022 CLINICAL DATA:  Cough, sickle cell crisis, abdominal pain. EXAM: CT ANGIOGRAPHY CHEST CT ABDOMEN AND PELVIS WITH CONTRAST TECHNIQUE: Multidetector CT imaging of the chest was performed using the standard protocol during bolus administration of intravenous contrast. Multiplanar CT image reconstructions and MIPs were obtained  to evaluate the vascular anatomy. Multidetector CT imaging of the abdomen and pelvis was performed using the standard protocol during bolus administration of intravenous contrast. RADIATION DOSE REDUCTION: This exam was performed according to the departmental dose-optimization program which includes automated exposure control, adjustment of the mA and/or kV according to patient size and/or use of iterative reconstruction technique. CONTRAST:  171m OMNIPAQUE IOHEXOL 300 MG/ML  SOLN COMPARISON:  Chest radiograph dated 03/31/2022. CTA chest dated 08/29/2021.  FINDINGS: CTA CHEST FINDINGS Cardiovascular: Satisfactory opacification of the bilateral pulmonary arteries to the segmental level. No evidence of pulmonary embolism. Study is not tailored for evaluation of the thoracic aorta. No evidence of thoracic aortic aneurysm or dissection. Cardiomegaly.  No pericardial effusion. Mediastinum/Nodes: No suspicious mediastinal lymphadenopathy. Visualized thyroid is unremarkable. Lungs/Pleura: Evaluation of the lung parenchyma is constrained by respiratory motion. Within that constraint, there are no suspicious pulmonary nodules. Mild patchy bilateral lower lobe opacities with mild dependent left lower lobe opacity, likely atelectasis. No focal consolidation. No frank interstitial edema. Mild centrilobular emphysematous changes, upper lung predominant. Trace bilateral pleural effusions. No pneumothorax. Musculoskeletal: Vertebral body changes related to sickle cell disease. No focal osseous lesions. Review of the MIP images confirms the above findings. CT ABDOMEN and PELVIS FINDINGS Hepatobiliary: Liver is notable for changes of hepatic congestion. Status post cholecystectomy. No intrahepatic or extrahepatic ductal dilatation. Pancreas: Mild parenchymal atrophy. 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 Adrenals/Urinary Tract: Adrenal glands are within normal limits. Bilateral renal cysts, measuring up to 13 mm in the left upper kidney (series 6/image 22), measuring tubal low-density, benign (Bosniak I). No follow-up is recommended. No hydronephrosis. Bladder is underdistended but unremarkable. Stomach/Bowel: Mass effect on the stomach by the dominant upper abdominal bilobed cyst (described below). Surgical clips in the stomach (series 6/image 45). No evidence of bowel obstruction. Normal appendix (series 6/image 49). Suture line in the right lower abdomen (series 6/image 58). No colonic wall thickening or inflammatory changes. Vascular/Lymphatic: No evidence  of abdominal aortic aneurysm. Atherosclerotic calcifications of the abdominal aorta and branch vessels. No suspicious abdominopelvic lymphadenopathy. Reproductive: Prostate is unremarkable. Other: 15.3 x 25.6 x 19.0 cm bilobed cyst in the central upper abdomen (series 6/image 32), most associated with the liver and stomach on the current study, although statistically most commonly reflecting a pseudocyst, less likely a mesenteric cyst. This is new from the prior. Mass effect on adjacent organs. At the upper limits of normal for simple fluid density internally. No definite solid components or internal enhancement. Trace abdominopelvic ascites. Musculoskeletal: Vertebral body changes related to sickle cell disease. No focal osseous lesions. Review of the MIP images confirms the above findings. IMPRESSION: No evidence of pulmonary embolism. Cardiomegaly with trace bilateral pleural effusions. No frank interstitial edema. Mild bilateral lower lobe atelectasis. 25.6 cm bilobed cyst in the central upper abdomen, most commonly reflecting a pseudocyst, less likely a mesenteric cyst. This is new from the prior CT. Associated mass effect on adjacent organs. Consider EUS for cyst aspiration/sampling via the stomach. Otherwise, no acute findings in the abdomen/pelvis. Aortic Atherosclerosis (ICD10-I70.0) and Emphysema (ICD10-J43.9). Electronically Signed   By: SJulian HyM.D.   On: 03/31/2022 17:30   CT Angio Chest PE W and/or Wo Contrast  Result Date: 03/31/2022 CLINICAL DATA:  Cough, sickle cell crisis, abdominal pain. EXAM: CT ANGIOGRAPHY CHEST CT ABDOMEN AND PELVIS WITH CONTRAST TECHNIQUE: Multidetector CT imaging of the chest was performed using the standard protocol during bolus administration of intravenous contrast. Multiplanar CT image reconstructions and  MIPs were obtained to evaluate the vascular anatomy. Multidetector CT imaging of the abdomen and pelvis was performed using the standard protocol during  bolus administration of intravenous contrast. RADIATION DOSE REDUCTION: This exam was performed according to the departmental dose-optimization program which includes automated exposure control, adjustment of the mA and/or kV according to patient size and/or use of iterative reconstruction technique. CONTRAST:  134m OMNIPAQUE IOHEXOL 300 MG/ML  SOLN COMPARISON:  Chest radiograph dated 03/31/2022. CTA chest dated 08/29/2021. FINDINGS: CTA CHEST FINDINGS Cardiovascular: Satisfactory opacification of the bilateral pulmonary arteries to the segmental level. No evidence of pulmonary embolism. Study is not tailored for evaluation of the thoracic aorta. No evidence of thoracic aortic aneurysm or dissection. Cardiomegaly.  No pericardial effusion. Mediastinum/Nodes: No suspicious mediastinal lymphadenopathy. Visualized thyroid is unremarkable. Lungs/Pleura: Evaluation of the lung parenchyma is constrained by respiratory motion. Within that constraint, there are no suspicious pulmonary nodules. Mild patchy bilateral lower lobe opacities with mild dependent left lower lobe opacity, likely atelectasis. No focal consolidation. No frank interstitial edema. Mild centrilobular emphysematous changes, upper lung predominant. Trace bilateral pleural effusions. No pneumothorax. Musculoskeletal: Vertebral body changes related to sickle cell disease. No focal osseous lesions. Review of the MIP images confirms the above findings. CT ABDOMEN and PELVIS FINDINGS Hepatobiliary: Liver is notable for changes of hepatic congestion. Status post cholecystectomy. No intrahepatic or extrahepatic ductal dilatation. Pancreas: Mild parenchymal atrophy. 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 Adrenals/Urinary Tract: Adrenal glands are within normal limits. Bilateral renal cysts, measuring up to 13 mm in the left upper kidney (series 6/image 22), measuring tubal low-density, benign (Bosniak I). No follow-up is recommended. No  hydronephrosis. Bladder is underdistended but unremarkable. Stomach/Bowel: Mass effect on the stomach by the dominant upper abdominal bilobed cyst (described below). Surgical clips in the stomach (series 6/image 45). No evidence of bowel obstruction. Normal appendix (series 6/image 49). Suture line in the right lower abdomen (series 6/image 58). No colonic wall thickening or inflammatory changes. Vascular/Lymphatic: No evidence of abdominal aortic aneurysm. Atherosclerotic calcifications of the abdominal aorta and branch vessels. No suspicious abdominopelvic lymphadenopathy. Reproductive: Prostate is unremarkable. Other: 15.3 x 25.6 x 19.0 cm bilobed cyst in the central upper abdomen (series 6/image 32), most associated with the liver and stomach on the current study, although statistically most commonly reflecting a pseudocyst, less likely a mesenteric cyst. This is new from the prior. Mass effect on adjacent organs. At the upper limits of normal for simple fluid density internally. No definite solid components or internal enhancement. Trace abdominopelvic ascites. Musculoskeletal: Vertebral body changes related to sickle cell disease. No focal osseous lesions. Review of the MIP images confirms the above findings. IMPRESSION: No evidence of pulmonary embolism. Cardiomegaly with trace bilateral pleural effusions. No frank interstitial edema. Mild bilateral lower lobe atelectasis. 25.6 cm bilobed cyst in the central upper abdomen, most commonly reflecting a pseudocyst, less likely a mesenteric cyst. This is new from the prior CT. Associated mass effect on adjacent organs. Consider EUS for cyst aspiration/sampling via the stomach. Otherwise, no acute findings in the abdomen/pelvis. Aortic Atherosclerosis (ICD10-I70.0) and Emphysema (ICD10-J43.9). Electronically Signed   By: SJulian HyM.D.   On: 03/31/2022 17:30   DG Chest Port 1 View  Result Date: 03/31/2022 CLINICAL DATA:  Cough, sickle crisis EXAM:  PORTABLE CHEST 1 VIEW COMPARISON:  12/21/2021 FINDINGS: Cardiomegaly. Both lungs are clear. The visualized skeletal structures are unremarkable. IMPRESSION: Cardiomegaly without acute abnormality of the lungs in AP portable projection. Electronically Signed  By: Delanna Ahmadi M.D.   On: 03/31/2022 15:50    Labs:  Recent Labs    03/31/22 1427 03/31/22 1731 04/01/22 1158  WBC 7.5  --  7.8  HGB 6.3* 7.8* 6.1*  HCT 16.9* 23.0* 16.4*  PLT 191  --  174   Recent Labs    03/31/22 1427 03/31/22 1731  NA 132* 129*  K 3.5 3.7  CL 89*  --   CO2 19*  --   GLUCOSE 84  --   BUN 52*  --   CREATININE 7.35*  --   CALCIUM 9.1  --    Recent Labs    03/31/22 1427  PROT 8.1  ALBUMIN 3.8  AST 79*  ALT 28  ALKPHOS 487*  BILITOT 2.2*   No results for input(s): "HEPBSAG", "HCVAB", "HEPAIGM", "HEPBIGM" in the last 72 hours. Recent Labs    03/31/22 1427  LABPROT 17.2*  INR 1.4*    Past Medical History:  Diagnosis Date   A-fib ([place])    Blood dyscrasia    Blood transfusion    "I've had a bunch of them"   CHF (congestive heart failure) ([place])    followed by hf clinic   Chronic kidney disease (CKD), stage III (moderate) (HCC)    T-TH-S   Dialysis patient ([place]) 04/2019   AV fistula (right arm)   Dysrhythmia    A-Fib per patient   Gout    Headache(784.0)    "probably weekly" (01/13/2014)   Legally blind    Mitral regurgitation    Pulmonary hypertension (HCC)    Shortness of breath dyspnea    Sickle cell disease, type SS (HCC)    Swelling abdomen    Swelling of extremity, left    Swelling of extremity, right    Tricuspid regurgitation     Past Surgical History:  Procedure Laterality Date   A/V FISTULAGRAM Right 07/29/2020   Procedure: A/V FISTULAGRAM - Right Arm;  Surgeon: Angelia Mould, MD;  Location: [place] [facility];  Service: Cardiovascular;  Laterality: Right;   BASCILIC VEIN TRANSPOSITION Right 04/12/2014   Procedure: BASILIC VEIN TRANSPOSITION;  Surgeon:  Elam Dutch, MD;  Location: [place];  Service: Vascular;  Laterality: Right;   BIOPSY  08/04/2020   Procedure: BIOPSY;  Surgeon: Ladene Artist, MD;  Location: [place];  Service: Gastroenterology;;   BIOPSY  06/28/2021   Procedure: BIOPSY;  Surgeon: Jerene Bears, MD;  Location: [facility] ENDOSCOPY;  Service: Gastroenterology;;   BOWEL RESECTION N/A 05/24/2021   Procedure: SMALL BOWEL RESECTION;  Surgeon: Rolm Bookbinder, MD;  Location: [place];  Service: General;  Laterality: N/A;   CARDIOVERSION N/A 06/05/2018   Procedure: CARDIOVERSION;  Surgeon: Jolaine Artist, MD;  Location: [place];  Service: Cardiovascular;  Laterality: N/A;   CARDIOVERSION N/A 10/06/2019   Procedure: CARDIOVERSION;  Surgeon: Jolaine Artist, MD;  Location: [facility] ENDOSCOPY;  Service: Cardiovascular;  Laterality: N/A;   CHOLECYSTECTOMY N/A 05/24/2021   Procedure: OPEN CHOLECYSTECTOMY;  Surgeon: Rolm Bookbinder, MD;  Location: [place];  Service: General;  Laterality: N/A;   COLONOSCOPY WITH PROPOFOL N/A 09/24/2017   Procedure: COLONOSCOPY WITH PROPOFOL;  Surgeon: Jackquline Denmark, MD;  Location: [place];  Service: Endoscopy;  Laterality: N/A;   ESOPHAGOGASTRODUODENOSCOPY N/A 09/19/2017   Procedure: ESOPHAGOGASTRODUODENOSCOPY (EGD);  Surgeon: Jackquline Denmark, MD;  Location: [facility] ENDOSCOPY;  Service: Endoscopy;  Laterality: N/A;   ESOPHAGOGASTRODUODENOSCOPY N/A 05/29/2021   Procedure: ESOPHAGOGASTRODUODENOSCOPY (EGD);  Surgeon: Milus Banister, MD;  Location: [facility]  ENDOSCOPY;  Service: Gastroenterology;  Laterality: N/A;   ESOPHAGOGASTRODUODENOSCOPY (EGD) WITH PROPOFOL N/A 08/04/2020   Procedure: ESOPHAGOGASTRODUODENOSCOPY (EGD) WITH PROPOFOL;  Surgeon: Ladene Artist, MD;  Location: [place];  Service: Gastroenterology;  Laterality: N/A;   ESOPHAGOGASTRODUODENOSCOPY (EGD) WITH PROPOFOL N/A 06/28/2021   Procedure: ESOPHAGOGASTRODUODENOSCOPY (EGD) WITH PROPOFOL;  Surgeon: Jerene Bears, MD;  Location: [facility]  ENDOSCOPY;  Service: Gastroenterology;  Laterality: N/A;   ESOPHAGOGASTRODUODENOSCOPY (EGD) WITH PROPOFOL N/A 08/10/2021   Procedure: ESOPHAGOGASTRODUODENOSCOPY (EGD) WITH PROPOFOL;  Surgeon: Jackquline Denmark, MD;  Location: [place];  Service: Gastroenterology;  Laterality: N/A;   ESOPHAGOGASTRODUODENOSCOPY (EGD) WITH PROPOFOL N/A 08/02/2021   Procedure: ESOPHAGOGASTRODUODENOSCOPY (EGD) WITH PROPOFOL;  Surgeon: Lavena Bullion, DO;  Location: [place];  Service: Gastroenterology;  Laterality: N/A;   HEMOSTASIS CLIP PLACEMENT  05/29/2021   Procedure: HEMOSTASIS CLIP PLACEMENT;  Surgeon: Milus Banister, MD;  Location: [facility] ENDOSCOPY;  Service: Gastroenterology;;   HOT HEMOSTASIS N/A 05/29/2021   Procedure: HOT HEMOSTASIS (ARGON PLASMA COAGULATION/BICAP);  Surgeon: Milus Banister, MD;  Location: [facility] ENDOSCOPY;  Service: Gastroenterology;  Laterality: N/A;   HOT HEMOSTASIS N/A 06/28/2021   Procedure: HOT HEMOSTASIS (ARGON PLASMA COAGULATION/BICAP);  Surgeon: Jerene Bears, MD;  Location: [facility] ENDOSCOPY;  Service: Gastroenterology;  Laterality: N/A;   HOT HEMOSTASIS N/A 08/10/2021   Procedure: HOT HEMOSTASIS (ARGON PLASMA COAGULATION/BICAP);  Surgeon: Jackquline Denmark, MD;  Location: [facility] ENDOSCOPY;  Service: Gastroenterology;  Laterality: N/A;   HOT HEMOSTASIS N/A 08/02/2021   Procedure: HOT HEMOSTASIS (ARGON PLASMA COAGULATION/BICAP);  Surgeon: Lavena Bullion, DO;  Location: [facility] ENDOSCOPY;  Service: Gastroenterology;  Laterality: N/A;   IR PERC TUN PERIT CATH WO PORT S&I /IMAG  05/23/2021   IR REMOVAL TUN CV CATH W/O FL  06/10/2021   IR US GUIDE VASC ACCESS RIGHT  05/23/2021   LAPAROSCOPY N/A 05/24/2021   Procedure: LAPAROSCOPY DIAGNOSTIC;  Surgeon: Rolm Bookbinder, MD;  Location: [place];  Service: General;  Laterality: N/A;   LAPAROTOMY N/A 05/24/2021   Procedure: EXPLORATORY LAPAROTOMY;  Surgeon: Rolm Bookbinder, MD;  Location: [place];  Service: General;  Laterality: N/A;   LEFT AND  RIGHT HEART CATHETERIZATION WITH CORONARY ANGIOGRAM N/A 06/11/2011   Procedure: LEFT AND RIGHT HEART CATHETERIZATION WITH CORONARY ANGIOGRAM;  Surgeon: Larey Dresser, MD;  Location: [facility] CATH LAB;  Service: Cardiovascular;  Laterality: N/A;   PERIPHERAL VASCULAR BALLOON ANGIOPLASTY Right 07/29/2020   Procedure: PERIPHERAL VASCULAR BALLOON ANGIOPLASTY;  Surgeon: Angelia Mould, MD;  Location: [place] [facility];  Service: Cardiovascular;  Laterality: Right;  arm fistula   REVISON OF ARTERIOVENOUS FISTULA Right 08/08/2020   Procedure: ANEURYSM EXCISION OF RIGHT UPPER EXTREMITY ARTERIOVENOUS FISTULA;  Surgeon: Angelia Mould, MD;  Location: [place];  Service: Vascular;  Laterality: Right;   REVISON OF ARTERIOVENOUS FISTULA Right 03/21/2022   Procedure: EXCISION OF ULCERATED SKIN OF RIGHT ARTERIOVENOUS FISTULA REVISION;  Surgeon: Marty Heck, MD;  Location: [facility] OR;  Service: Vascular;  Laterality: Right;   SCLEROTHERAPY  05/29/2021   Procedure: Clide Deutscher;  Surgeon: Milus Banister, MD;  Location: [facility] ENDOSCOPY;  Service: Gastroenterology;;   Lavell Islam REMOVAL  05/29/2021   Procedure: STENT REMOVAL;  Surgeon: Milus Banister, MD;  Location: [place];  Service: Gastroenterology;;   TEE WITHOUT CARDIOVERSION N/A 12/27/2015   Procedure: TRANSESOPHAGEAL ECHOCARDIOGRAM (TEE);  Surgeon: Sueanne Margarita, MD;  Location: [facility] ENDOSCOPY;  Service: Cardiovascular;  Laterality: N/A;    Family History  Problem Relation Age of Onset   Alcohol abuse Mother  Liver disease Mother    Cirrhosis Mother    Heart attack Mother     Prior to Admission medications   Medication Sig Start Date End Date Taking? Authorizing Provider  acetaminophen (TYLENOL) 500 MG tablet Take 2,000 mg by mouth in the morning, at noon, and at bedtime.   Yes [provider]  allopurinol (ZYLOPRIM) 100 MG tablet TAKE 1 TABLET BY MOUTH EVERY DAY Patient taking differently: Take 100 mg by mouth daily. 02/08/22   Yes Fenton Foy, NP  AURYXIA 1 GM 210 MG(Fe) tablet Take 210 mg by mouth in the morning and at bedtime. 02/11/21  Yes [provider]  Dextromethorphan-guaiFENesin [facility] COUGH & CHEST CONGEST PO) Take 1 Capful by mouth 3 (three) times daily as needed (cough).   Yes [provider]  diphenhydramine-acetaminophen (TYLENOL PM) 25-500 MG TABS tablet Take 2 tablets by mouth at bedtime as needed (sleep/pain).   Yes [provider]  folic acid (FOLVITE) 1 MG tablet TAKE 1 TABLET BY MOUTH EVERY DAY Patient taking differently: Take 1 mg by mouth daily. 02/22/21  Yes Dorena Dew, FNP  hydroxyurea (HYDREA) 500 MG capsule Take 1 capsule (500 mg total) by mouth daily. 11/08/21  Yes Dorena Dew, FNP  lidocaine-prilocaine (EMLA) cream Apply 1 application. topically Every Tuesday,Thursday,and Saturday with dialysis. 09/08/19  Yes [provider]  Oxycodone HCl 10 MG TABS Take 1 tablet (10 mg total) by mouth every 6 (six) hours as needed. Patient taking differently: Take 10 mg by mouth 2 (two) times daily as needed (pain). 03/27/22  Yes Dorena Dew, FNP  pantoprazole (PROTONIX) 40 MG tablet TAKE 1 TABLET BY MOUTH TWICE A DAY Patient taking differently: Take 40 mg by mouth 2 (two) times daily. 01/15/22  Yes Willia Craze, NP  tiZANidine (ZANAFLEX) 4 MG tablet TAKE 1/2 TABLET (2MG TOTAL) BY MOUTH EVERY 8 HOURS AS NEEDED FOR MUSCLE SPASM Patient taking differently: Take 2 mg by mouth 3 (three) times daily as needed for muscle spasms. 03/27/22  Yes Dorena Dew, FNP  torsemide (DEMADEX) 20 MG tablet Take 20 mg by mouth in the morning, at noon, in the evening, and at bedtime. 09/03/21  Yes [provider]  fluticasone (FLONASE) 50 MCG/ACT nasal spray Place 2 sprays into both nostrils daily. Patient not taking: Reported on 04/01/2022 09/04/21   Bo Merino I, NP  Nutritional Supplements (,FEEDING SUPPLEMENT, PROSOURCE PLUS) liquid Take 30 mLs by  mouth 2 (two) times daily between meals. Patient not taking: Reported on 04/01/2022 08/30/21   Terrilee Croak, MD  Vitamin D, Ergocalciferol, (DRISDOL) 1.25 MG (50000 UNIT) CAPS capsule Take 1 capsule (50,000 Units total) by mouth once a week. Monday Patient not taking: Reported on 04/01/2022 11/08/21   Dorena Dew, FNP    Current Facility-Administered Medications  Medication Dose Route Frequency Provider Last Rate Last Admin   acetaminophen (TYLENOL) tablet 650 mg  650 mg Oral Q6H PRN Norval Morton, MD       Or   acetaminophen (TYLENOL) suppository 650 mg  650 mg Rectal Q6H PRN Fuller Plan A, MD       albuterol (PROVENTIL) (2.5 MG/3ML) 0.083% nebulizer solution 2.5 mg  2.5 mg Nebulization Q6H PRN Fuller Plan A, MD       aspirin tablet 325 mg  325 mg Oral Daily Regan Lemming, MD   325 mg at 04/01/22 0909   HYDROmorphone (DILAUDID) injection 1 mg  1 mg Intravenous D6U PRN Delora Fuel, MD  1 mg at 04/01/22 1433   hydroxyurea (HYDREA) capsule 500 mg  500 mg Oral Daily Smith, Rondell A, MD       oxyCODONE (Oxy IR/ROXICODONE) immediate release tablet 10 mg  10 mg Oral BID PRN Fuller Plan A, MD       sodium chloride flush (NS) 0.9 % injection 3 mL  3 mL Intravenous Q12H Smith, Rondell A, MD   3 mL at 04/01/22 1436   torsemide (DEMADEX) tablet 20 mg  20 mg Oral QID Norval Morton, MD        Allergies as of 03/31/2022   (No Known Allergies)    Social History   Socioeconomic History   Marital status: Divorced    Spouse name: Not on file   Number of children: Not on file   Years of education: Not on file   Highest education level: Not on file  Occupational History   Occupation: disabled  Tobacco Use   Smoking status: Former    Packs/day: 0.50    Years: 41.00    Total pack years: 20.50    Types: Cigarettes    Quit date: 12/08/2015    Years since quitting: 6.3   Smokeless tobacco: Never  Vaping Use   Vaping Use: Never used  Substance and Sexual Activity   Alcohol  use: Not Currently    Alcohol/week: 3.0 standard drinks of alcohol    Types: 1 Glasses of wine, 2 Cans of beer per week    Comment: none since starting dialysis 06/2021   Drug use: No   Sexual activity: Not Currently  Other Topics Concern   Not on file  Social History Narrative   Not on file   Social Determinants of Health   Financial Resource Strain: Low Risk  (06/02/2021)   Overall Financial Resource Strain (CARDIA)    Difficulty of Paying Living Expenses: Not very hard  Food Insecurity: No Food Insecurity (06/02/2021)   Hunger Vital Sign    Worried About Running Out of Food in the Last Year: Never true    Ran Out of Food in the Last Year: Never true  Transportation Needs: No Transportation Needs (06/02/2021)   PRAPARE - Hydrologist (Medical): No    Lack of Transportation (Non-Medical): No  Physical Activity: Sufficiently Active (10/01/2019)   Exercise Vital Sign    Days of Exercise per Week: 5 days    Minutes of Exercise per Session: 90 min  Stress: Not on file  Social Connections: Not on file  Intimate Partner Violence: Not on file    Review of Systems: All systems reviewed and negative except where noted in HPI.  Physical Exam: Vital signs in last 24 hours: Temp:  [97.7 F (36.5 C)-98.7 F (37.1 C)] 97.7 F (36.5 C) (10/15 1242) Pulse Rate:  [77-107] 97 (10/15 1242) Resp:  [10-22] 18 (10/15 1242) BP: (97-115)/(58-75) 113/75 (10/15 1242) SpO2:  [93 %-100 %] 98 % (10/15 1242) Weight:  [63.7 kg] 63.7 kg (10/15 1242)    General:  Alert male in NAD Psych:  Pleasant, cooperative. Normal mood and affect Eyes: Pupils equal Ears:  Normal auditory acuity Nose: No deformity, discharge or lesions Neck:  Supple, no masses felt Lungs:  Clear to auscultation.  Heart:  Regular rate,nNo lower extremity edema Abdomen:  Soft, moderately distended , nontender, active bowel sounds. LUQ with a palpable mass.  Rectal :  Deferred Msk: Symmetrical  without gross deformities.  Neurologic:  Alert, oriented, grossly normal neurologically  Skin:  Intact without significant lesions.    Intake/Output from previous day: 10/14 0701 - 10/15 0700 In: 1000 [IV Piggyback:1000] Out: -  Intake/Output this shift:  Total I/O In: 120 [P.O.:120] Out: 0     Active Problems:   Sickle cell crisis ([place])    Tye Savoy, NP-C @  04/01/2022, 2:57 PM   I have taken an interval history, thoroughly reviewed the chart and examined the patient. I agree with the Advanced Practitioner's note, impression and recommendations, and have recorded additional findings, impressions and recommendations below. I performed a substantive portion of this encounter (>50% time spent), including a complete performance of the medical decision making.  My additional thoughts are as follows:  I am unable to open the scan images for review, but the report indicates that radiology is unable to determine the origin of this very large upper abdominal cystic finding.  Our incoming surgical consult team managed by Dr. Fuller Plan can review images with radiology and our advanced endoscopist (EUS practitioner) to determine the best approach.  Unclear to me at this time if EUS is right for this.   Nelida Meuse III [phone]

## 2022-04-01 NOTE — Progress Notes (Signed)
NEW ADMISSION NOTE New Admission Note:   Arrival Method: [facility] St[facility] Mental Orientation: Aaox4 Telemetry: 5M16 Assessment: Completed Skin: scattered scratch marks, midline incision (healed) IV: LFA Pain: 9/10 legs and back  Tubes: n/a Safety Measures: Safety Fall Prevention Plan has been given, discussed and signed Admission: Completed [facility] Orientation: Patient has been orientated to the room, unit and staff.  Family: none at bedside  Orders have been reviewed and implemented. Will continue to monitor the patient. Call light has been placed within reach and bed alarm has been activated.   Vira Agar, RN

## 2022-04-01 NOTE — ED Notes (Signed)
I have just called report to [facility] and to Seth Bake, Therapist, sports at [facility]

## 2022-04-01 NOTE — Consult Note (Signed)
Cardiology Consultation   Patient ID: Patrick [name] MRN: [id number]; DOB: [birth year]  Admit date: 03/31/2022 Date of Consult: 04/01/2022  PCP:  [name], MD   [place] Providers Cardiologist:  None   {   Patient Profile:   Patrick [name] is a 66 y.o. male with a hx of end-stage renal disease on Tuesday Thursday Saturday dialysis, pulmonary hypertension, sickle cell disease, atrial fibrillation, gout, blindness who is being seen 04/01/2022 for the evaluation of elevated troponin at the request of Dr. Tamala Julian.  History of Present Illness:   Patrick [name] presented to [facility] emergency department yesterday with diffuse body pain consistent with a sickle cell crisis.  He had been having pain since Thursday.  His pain started after a dialysis session.  He has been having intermittent nausea and abdominal pain.  No fevers or chills.  In the emergency department lab work was significant for a troponin 427.  Repeat troponin was 378.  Hemoglobin 6.1.  During my interview with the patient today he reports generalized body pain consistent with his previous sickle cell crises.   Past Medical History:  Diagnosis Date   A-fib ([place])    Blood dyscrasia    Blood transfusion    "I've had a bunch of them"   CHF (congestive heart failure) ([place])    followed by hf clinic   Chronic kidney disease (CKD), stage III (moderate) (HCC)    T-TH-S   Dialysis patient ([place]) 04/2019   AV fistula (right arm)   Dysrhythmia    A-Fib per patient   Gout    Headache(784.0)    "probably weekly" (01/13/2014)   Legally blind    Mitral regurgitation    Pulmonary hypertension (HCC)    Shortness of breath dyspnea    Sickle cell disease, type SS (HCC)    Swelling abdomen    Swelling of extremity, left    Swelling of extremity, right    Tricuspid regurgitation     Past Surgical History:  Procedure Laterality Date   A/V FISTULAGRAM Right 07/29/2020   Procedure: A/V  FISTULAGRAM - Right Arm;  Surgeon: Angelia Mould, MD;  Location: [place] CV LAB;  Service: Cardiovascular;  Laterality: Right;   BASCILIC VEIN TRANSPOSITION Right 04/12/2014   Procedure: BASILIC VEIN TRANSPOSITION;  Surgeon: Elam Dutch, MD;  Location: [place];  Service: Vascular;  Laterality: Right;   BIOPSY  08/04/2020   Procedure: BIOPSY;  Surgeon: Ladene Artist, MD;  Location: [place];  Service: Gastroenterology;;   BIOPSY  06/28/2021   Procedure: BIOPSY;  Surgeon: Jerene Bears, MD;  Location: [facility] ENDOSCOPY;  Service: Gastroenterology;;   BOWEL RESECTION N/A 05/24/2021   Procedure: SMALL BOWEL RESECTION;  Surgeon: Rolm Bookbinder, MD;  Location: [place];  Service: General;  Laterality: N/A;   CARDIOVERSION N/A 06/05/2018   Procedure: CARDIOVERSION;  Surgeon: Jolaine Artist, MD;  Location: [place];  Service: Cardiovascular;  Laterality: N/A;   CARDIOVERSION N/A 10/06/2019   Procedure: CARDIOVERSION;  Surgeon: Jolaine Artist, MD;  Location: [facility] ENDOSCOPY;  Service: Cardiovascular;  Laterality: N/A;   CHOLECYSTECTOMY N/A 05/24/2021   Procedure: OPEN CHOLECYSTECTOMY;  Surgeon: Rolm Bookbinder, MD;  Location: [place];  Service: General;  Laterality: N/A;   COLONOSCOPY WITH PROPOFOL N/A 09/24/2017   Procedure: COLONOSCOPY WITH PROPOFOL;  Surgeon: Jackquline Denmark, MD;  Location: [place];  Service: Endoscopy;  Laterality: N/A;   ESOPHAGOGASTRODUODENOSCOPY N/A 09/19/2017   Procedure: ESOPHAGOGASTRODUODENOSCOPY (EGD);  Surgeon: Jackquline Denmark,  MD;  Location: [place] ENDOSCOPY;  Service: Endoscopy;  Laterality: N/A;   ESOPHAGOGASTRODUODENOSCOPY N/A 05/29/2021   Procedure: ESOPHAGOGASTRODUODENOSCOPY (EGD);  Surgeon: Milus Banister, MD;  Location: [facility] ENDOSCOPY;  Service: Gastroenterology;  Laterality: N/A;   ESOPHAGOGASTRODUODENOSCOPY (EGD) WITH PROPOFOL N/A 08/04/2020   Procedure: ESOPHAGOGASTRODUODENOSCOPY (EGD) WITH PROPOFOL;  Surgeon: Ladene Artist, MD;   Location: [place];  Service: Gastroenterology;  Laterality: N/A;   ESOPHAGOGASTRODUODENOSCOPY (EGD) WITH PROPOFOL N/A 06/28/2021   Procedure: ESOPHAGOGASTRODUODENOSCOPY (EGD) WITH PROPOFOL;  Surgeon: Jerene Bears, MD;  Location: [facility] ENDOSCOPY;  Service: Gastroenterology;  Laterality: N/A;   ESOPHAGOGASTRODUODENOSCOPY (EGD) WITH PROPOFOL N/A 08/10/2021   Procedure: ESOPHAGOGASTRODUODENOSCOPY (EGD) WITH PROPOFOL;  Surgeon: Jackquline Denmark, MD;  Location: [place];  Service: Gastroenterology;  Laterality: N/A;   ESOPHAGOGASTRODUODENOSCOPY (EGD) WITH PROPOFOL N/A 08/02/2021   Procedure: ESOPHAGOGASTRODUODENOSCOPY (EGD) WITH PROPOFOL;  Surgeon: Lavena Bullion, DO;  Location: [place];  Service: Gastroenterology;  Laterality: N/A;   HEMOSTASIS CLIP PLACEMENT  05/29/2021   Procedure: HEMOSTASIS CLIP PLACEMENT;  Surgeon: Milus Banister, MD;  Location: [facility] ENDOSCOPY;  Service: Gastroenterology;;   HOT HEMOSTASIS N/A 05/29/2021   Procedure: HOT HEMOSTASIS (ARGON PLASMA COAGULATION/BICAP);  Surgeon: Milus Banister, MD;  Location: [facility]OSCOPY;  Service: Gastroenterology;  Laterality: N/A;   HOT HEMOSTASIS N/A 06/28/2021   Procedure: HOT HEMOSTASIS (ARGON PLASMA COAGULATION/BICAP);  Surgeon: Jerene Bears, MD;  Location: [facility] ENDOSCOPY;  Service: Gastroenterology;  Laterality: N/A;   HOT HEMOSTASIS N/A 08/10/2021   Procedure: HOT HEMOSTASIS (ARGON PLASMA COAGULATION/BICAP);  Surgeon: Jackquline Denmark, MD;  Location: [facility] ENDOSCOPY;  Service: Gastroenterology;  Laterality: N/A;   HOT HEMOSTASIS N/A 08/02/2021   Procedure: HOT HEMOSTASIS (ARGON PLASMA COAGULATION/BICAP);  Surgeon: Lavena Bullion, DO;  Location: [facility] ENDOSCOPY;  Service: Gastroenterology;  Laterality: N/A;   IR PERC TUN PERIT CATH WO PORT S&I /IMAG  05/23/2021   IR REMOVAL TUN CV CATH W/O FL  06/10/2021   IR US GUIDE VASC ACCESS RIGHT  05/23/2021   LAPAROSCOPY N/A 05/24/2021   Procedure: LAPAROSCOPY DIAGNOSTIC;  Surgeon:  Rolm Bookbinder, MD;  Location: [place];  Service: General;  Laterality: N/A;   LAPAROTOMY N/A 05/24/2021   Procedure: EXPLORATORY LAPAROTOMY;  Surgeon: Rolm Bookbinder, MD;  Location: [place];  Service: General;  Laterality: N/A;   LEFT AND RIGHT HEART CATHETERIZATION WITH CORONARY ANGIOGRAM N/A 06/11/2011   Procedure: LEFT AND RIGHT HEART CATHETERIZATION WITH CORONARY ANGIOGRAM;  Surgeon: Larey Dresser, MD;  Location: [facility] CATH LAB;  Service: Cardiovascular;  Laterality: N/A;   PERIPHERAL VASCULAR BALLOON ANGIOPLASTY Right 07/29/2020   Procedure: PERIPHERAL VASCULAR BALLOON ANGIOPLASTY;  Surgeon: Angelia Mould, MD;  Location: [place] CV LAB;  Service: Cardiovascular;  Laterality: Right;  arm fistula   REVISON OF ARTERIOVENOUS FISTULA Right 08/08/2020   Procedure: ANEURYSM EXCISION OF RIGHT UPPER EXTREMITY ARTERIOVENOUS FISTULA;  Surgeon: Angelia Mould, MD;  Location: [place];  Service: Vascular;  Laterality: Right;   REVISON OF ARTERIOVENOUS FISTULA Right 03/21/2022   Procedure: EXCISION OF ULCERATED SKIN OF RIGHT ARTERIOVENOUS FISTULA REVISION;  Surgeon: Marty Heck, MD;  Location: [facility] OR;  Service: Vascular;  Laterality: Right;   SCLEROTHERAPY  05/29/2021   Procedure: Clide Deutscher;  Surgeon: Milus Banister, MD;  Location: [facility] ENDOSCOPY;  Service: Gastroenterology;;   Lavell Islam REMOVAL  05/29/2021   Procedure: STENT REMOVAL;  Surgeon: Milus Banister, MD;  Location: [place];  Service: Gastroenterology;;   TEE WITHOUT CARDIOVERSION N/A 12/27/2015   Procedure: TRANSESOPHAGEAL ECHOCARDIOGRAM (TEE);  Surgeon: Tressia Miners  Remonia Richter, MD;  Location: [place];  Service: Cardiovascular;  Laterality: N/A;       Inpatient Medications: Scheduled Meds:  aspirin  325 mg Oral Daily   sodium chloride flush  3 mL Intravenous Q12H   Continuous Infusions:  PRN Meds: acetaminophen **OR** acetaminophen, albuterol, HYDROmorphone (DILAUDID) injection  Allergies:   No Known  Allergies  Social History:   Social History   Socioeconomic History   Marital status: Divorced    Spouse name: Not on file   Number of children: Not on file   Years of education: Not on file   Highest education level: Not on file  Occupational History   Occupation: disabled  Tobacco Use   Smoking status: Former    Packs/day: 0.50    Years: 41.00    Total pack years: 20.50    Types: Cigarettes    Quit date: 12/08/2015    Years since quitting: 6.3   Smokeless tobacco: Never  Vaping Use   Vaping Use: Never used  Substance and Sexual Activity   Alcohol use: Not Currently    Alcohol/week: 3.0 standard drinks of alcohol    Types: 1 Glasses of wine, 2 Cans of beer per week    Comment: none since starting dialysis 06/2021   Drug use: No   Sexual activity: Not Currently  Other Topics Concern   Not on file  Social History Narrative   Not on file   Social Determinants of Health   Financial Resource Strain: Low Risk  (06/02/2021)   Overall Financial Resource Strain (CARDIA)    Difficulty of Paying Living Expenses: Not very hard  Food Insecurity: No Food Insecurity (06/02/2021)   Hunger Vital Sign    Worried About Running Out of Food in the Last Year: Never true    Ran Out of Food in the Last Year: Never true  Transportation Needs: No Transportation Needs (06/02/2021)   PRAPARE - Hydrologist (Medical): No    Lack of Transportation (Non-Medical): No  Physical Activity: Sufficiently Active (10/01/2019)   Exercise Vital Sign    Days of Exercise per Week: 5 days    Minutes of Exercise per Session: 90 min  Stress: Not on file  Social Connections: Not on file  Intimate Partner Violence: Not on file    Family History:    Family History  Problem Relation Age of Onset   Alcohol abuse Mother    Liver disease Mother    Cirrhosis Mother    Heart attack Mother      ROS:  Please see the history of present illness.   All other ROS reviewed and  negative.     Physical Exam/Data:   Vitals:   04/01/22 1000 04/01/22 1027 04/01/22 1158 04/01/22 1242  BP:   107/74 113/75  Pulse:   86 97  Resp:   18 18  Temp: 98.7 F (37.1 C) 98.1 F (36.7 C) 98.1 F (36.7 C) 97.7 F (36.5 C)  TempSrc: Oral Oral Oral Oral  SpO2:   99% 98%  Weight:    67.1 kg  Height:    5\' 11"  (1.803 m)    Intake/Output Summary (Last 24 hours) at 04/01/2022 1413 Last data filed at 03/31/2022 1811 Gross per 24 hour  Intake 1000 ml  Output --  Net 1000 ml      04/01/2022   12:42 PM 03/21/2022   10:00 AM 03/21/2022    9:52 AM  Last 3 Weights  Weight (lbs)  147 lb 14.9 oz 151 lb 142 lb 10.2 oz  Weight (kg) 67.1 kg 68.493 kg 64.7 kg     Body mass index is 20.63 kg/m.  General:  Well nourished, well developed, in no acute distress laying flat in bed HEENT: normal Neck: no JVD Vascular: No carotid bruits; Distal pulses 2+ bilaterally Cardiac: Irregularly irregular, hyperdynamic precordium, 2 out of 6 systolic murmur Lungs:  clear to auscultation bilaterally, no wheezing, rhonchi or rales  Abd: soft, nontender Ext: no edema Musculoskeletal:  No deformities, BUE and BLE strength normal and equal Skin: warm and dry  Neuro:  CNs 2-12 intact, no focal abnormalities noted Psych:  Normal affect   EKG:  The EKG was personally reviewed and demonstrates: Atrial fibrillation with a controlled ventricular rate Telemetry:  Telemetry was personally reviewed and demonstrates: Atrial fibrillation  Relevant CV Studies:  December 2022 echo EF 40 to 45% RV function mildly reduced Severely dilated left and right atrium Severe MR Severe TR   Laboratory Data:  High Sensitivity Troponin:   Recent Labs  Lab 03/31/22 1427 03/31/22 1716  TROPONINIHS 427* 378*     Chemistry Recent Labs  Lab 03/31/22 1427 03/31/22 1731  NA 132* 129*  K 3.5 3.7  CL 89*  --   CO2 19*  --   GLUCOSE 84  --   BUN 52*  --   CREATININE 7.35*  --   CALCIUM 9.1  --    GFRNONAA 8*  --   ANIONGAP 24*  --     Recent Labs  Lab 03/31/22 1427  PROT 8.1  ALBUMIN 3.8  AST 79*  ALT 28  ALKPHOS 487*  BILITOT 2.2*   Lipids No results for input(s): "CHOL", "TRIG", "HDL", "LABVLDL", "LDLCALC", "CHOLHDL" in the last 168 hours.  Hematology Recent Labs  Lab 03/31/22 1427 03/31/22 1731 04/01/22 1158  WBC 7.5  --  7.8  RBC 1.52*  1.51*  --  1.45*  HGB 6.3* 7.8* 6.1*  HCT 16.9* 23.0* 16.4*  MCV 111.2*  --  113.1*  MCH 41.4*  --  42.1*  MCHC 37.3*  --  37.2*  RDW 17.4*  --  18.0*  PLT 191  --  174   Thyroid No results for input(s): "TSH", "FREET4" in the last 168 hours.  BNP Recent Labs  Lab 03/31/22 1427  BNP 3,312.5*    DDimer No results for input(s): "DDIMER" in the last 168 hours.   Radiology/Studies:  CT Abdomen Pelvis W Contrast  Result Date: 03/31/2022 CLINICAL DATA:  Cough, sickle cell crisis, abdominal pain. EXAM: CT ANGIOGRAPHY CHEST CT ABDOMEN AND PELVIS WITH CONTRAST TECHNIQUE: Multidetector CT imaging of the chest was performed using the standard protocol during bolus administration of intravenous contrast. Multiplanar CT image reconstructions and MIPs were obtained to evaluate the vascular anatomy. Multidetector CT imaging of the abdomen and pelvis was performed using the standard protocol during bolus administration of intravenous contrast. RADIATION DOSE REDUCTION: This exam was performed according to the departmental dose-optimization program which includes automated exposure control, adjustment of the mA and/or kV according to patient size and/or use of iterative reconstruction technique. CONTRAST:  147mL OMNIPAQUE IOHEXOL 300 MG/ML  SOLN COMPARISON:  Chest radiograph dated 03/31/2022. CTA chest dated 08/29/2021. FINDINGS: CTA CHEST FINDINGS Cardiovascular: Satisfactory opacification of the bilateral pulmonary arteries to the segmental level. No evidence of pulmonary embolism. Study is not tailored for evaluation of the thoracic aorta.  No evidence of thoracic aortic aneurysm or dissection. Cardiomegaly.  No pericardial effusion.  Mediastinum/Nodes: No suspicious mediastinal lymphadenopathy. Visualized thyroid is unremarkable. Lungs/Pleura: Evaluation of the lung parenchyma is constrained by respiratory motion. Within that constraint, there are no suspicious pulmonary nodules. Mild patchy bilateral lower lobe opacities with mild dependent left lower lobe opacity, likely atelectasis. No focal consolidation. No frank interstitial edema. Mild centrilobular emphysematous changes, upper lung predominant. Trace bilateral pleural effusions. No pneumothorax. Musculoskeletal: Vertebral body changes related to sickle cell disease. No focal osseous lesions. Review of the MIP images confirms the above findings. CT ABDOMEN and PELVIS FINDINGS Hepatobiliary: Liver is notable for changes of hepatic congestion. Status post cholecystectomy. No intrahepatic or extrahepatic ductal dilatation. Pancreas: Mild parenchymal atrophy. 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 Adrenals/Urinary Tract: Adrenal glands are within normal limits. Bilateral renal cysts, measuring up to 13 mm in the left upper kidney (series 6/image 22), measuring tubal low-density, benign (Bosniak I). No follow-up is recommended. No hydronephrosis. Bladder is underdistended but unremarkable. Stomach/Bowel: Mass effect on the stomach by the dominant upper abdominal bilobed cyst (described below). Surgical clips in the stomach (series 6/image 45). No evidence of bowel obstruction. Normal appendix (series 6/image 49). Suture line in the right lower abdomen (series 6/image 58). No colonic wall thickening or inflammatory changes. Vascular/Lymphatic: No evidence of abdominal aortic aneurysm. Atherosclerotic calcifications of the abdominal aorta and branch vessels. No suspicious abdominopelvic lymphadenopathy. Reproductive: Prostate is unremarkable. Other: 15.3 x 25.6 x 19.0 cm bilobed  cyst in the central upper abdomen (series 6/image 32), most associated with the liver and stomach on the current study, although statistically most commonly reflecting a pseudocyst, less likely a mesenteric cyst. This is new from the prior. Mass effect on adjacent organs. At the upper limits of normal for simple fluid density internally. No definite solid components or internal enhancement. Trace abdominopelvic ascites. Musculoskeletal: Vertebral body changes related to sickle cell disease. No focal osseous lesions. Review of the MIP images confirms the above findings. IMPRESSION: No evidence of pulmonary embolism. Cardiomegaly with trace bilateral pleural effusions. No frank interstitial edema. Mild bilateral lower lobe atelectasis. 25.6 cm bilobed cyst in the central upper abdomen, most commonly reflecting a pseudocyst, less likely a mesenteric cyst. This is new from the prior CT. Associated mass effect on adjacent organs. Consider EUS for cyst aspiration/sampling via the stomach. Otherwise, no acute findings in the abdomen/pelvis. Aortic Atherosclerosis (ICD10-I70.0) and Emphysema (ICD10-J43.9). Electronically Signed   By: Julian Hy M.D.   On: 03/31/2022 17:30   CT Angio Chest PE W and/or Wo Contrast  Result Date: 03/31/2022 CLINICAL DATA:  Cough, sickle cell crisis, abdominal pain. EXAM: CT ANGIOGRAPHY CHEST CT ABDOMEN AND PELVIS WITH CONTRAST TECHNIQUE: Multidetector CT imaging of the chest was performed using the standard protocol during bolus administration of intravenous contrast. Multiplanar CT image reconstructions and MIPs were obtained to evaluate the vascular anatomy. Multidetector CT imaging of the abdomen and pelvis was performed using the standard protocol during bolus administration of intravenous contrast. RADIATION DOSE REDUCTION: This exam was performed according to the departmental dose-optimization program which includes automated exposure control, adjustment of the mA and/or kV  according to patient size and/or use of iterative reconstruction technique. CONTRAST:  156mL OMNIPAQUE IOHEXOL 300 MG/ML  SOLN COMPARISON:  Chest radiograph dated 03/31/2022. CTA chest dated 08/29/2021. FINDINGS: CTA CHEST FINDINGS Cardiovascular: Satisfactory opacification of the bilateral pulmonary arteries to the segmental level. No evidence of pulmonary embolism. Study is not tailored for evaluation of the thoracic aorta. No evidence of thoracic aortic aneurysm or dissection. Cardiomegaly.  No pericardial effusion. Mediastinum/Nodes: No suspicious mediastinal lymphadenopathy. Visualized thyroid is unremarkable. Lungs/Pleura: Evaluation of the lung parenchyma is constrained by respiratory motion. Within that constraint, there are no suspicious pulmonary nodules. Mild patchy bilateral lower lobe opacities with mild dependent left lower lobe opacity, likely atelectasis. No focal consolidation. No frank interstitial edema. Mild centrilobular emphysematous changes, upper lung predominant. Trace bilateral pleural effusions. No pneumothorax. Musculoskeletal: Vertebral body changes related to sickle cell disease. No focal osseous lesions. Review of the MIP images confirms the above findings. CT ABDOMEN and PELVIS FINDINGS Hepatobiliary: Liver is notable for changes of hepatic congestion. Status post cholecystectomy. No intrahepatic or extrahepatic ductal dilatation. Pancreas: Mild parenchymal atrophy. 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 Adrenals/Urinary Tract: Adrenal glands are within normal limits. Bilateral renal cysts, measuring up to 13 mm in the left upper kidney (series 6/image 22), measuring tubal low-density, benign (Bosniak I). No follow-up is recommended. No hydronephrosis. Bladder is underdistended but unremarkable. Stomach/Bowel: Mass effect on the stomach by the dominant upper abdominal bilobed cyst (described below). Surgical clips in the stomach (series 6/image 45). No evidence  of bowel obstruction. Normal appendix (series 6/image 49). Suture line in the right lower abdomen (series 6/image 58). No colonic wall thickening or inflammatory changes. Vascular/Lymphatic: No evidence of abdominal aortic aneurysm. Atherosclerotic calcifications of the abdominal aorta and branch vessels. No suspicious abdominopelvic lymphadenopathy. Reproductive: Prostate is unremarkable. Other: 15.3 x 25.6 x 19.0 cm bilobed cyst in the central upper abdomen (series 6/image 32), most associated with the liver and stomach on the current study, although statistically most commonly reflecting a pseudocyst, less likely a mesenteric cyst. This is new from the prior. Mass effect on adjacent organs. At the upper limits of normal for simple fluid density internally. No definite solid components or internal enhancement. Trace abdominopelvic ascites. Musculoskeletal: Vertebral body changes related to sickle cell disease. No focal osseous lesions. Review of the MIP images confirms the above findings. IMPRESSION: No evidence of pulmonary embolism. Cardiomegaly with trace bilateral pleural effusions. No frank interstitial edema. Mild bilateral lower lobe atelectasis. 25.6 cm bilobed cyst in the central upper abdomen, most commonly reflecting a pseudocyst, less likely a mesenteric cyst. This is new from the prior CT. Associated mass effect on adjacent organs. Consider EUS for cyst aspiration/sampling via the stomach. Otherwise, no acute findings in the abdomen/pelvis. Aortic Atherosclerosis (ICD10-I70.0) and Emphysema (ICD10-J43.9). Electronically Signed   By: Julian Hy M.D.   On: 03/31/2022 17:30   DG Chest Port 1 View  Result Date: 03/31/2022 CLINICAL DATA:  Cough, sickle crisis EXAM: PORTABLE CHEST 1 VIEW COMPARISON:  12/21/2021 FINDINGS: Cardiomegaly. Both lungs are clear. The visualized skeletal structures are unremarkable. IMPRESSION: Cardiomegaly without acute abnormality of the lungs in AP portable  projection. Electronically Signed   By: Delanna Ahmadi M.D.   On: 03/31/2022 15:50     Assessment and Plan:   Mr. Passey is a 66 year old man with a history of sickle cell anemia, atrial fibrillation, chronic systolic heart failure and severe mitral/tricuspid valve regurgitation who presents to the hospital with a severe sickle cell crisis.  His hemoglobin is 6 on admission.  #Elevated troponin Troponin 427 and then 378 on recheck in the setting of severe anemia.  I do not think this represents a primary coronary event but rather secondary ischemia due to severe anemia.  I would recommend treatment of his sickle cell crisis and anemia.  I do not think the troponins need to be trended.  #Chronic systolic heart  failure #Severe MR and TR NYHA class II-III.  Warm and near euvolemic on exam.  His BNP is elevated although it is hard to interpret this in the setting of a sickle cell crisis and end-stage renal disease.  He has seen Dr. Haroldine Laws in clinic. I would wait to repeat his echocardiogram until his hemoglobin is stable.  Volume status management per nephrology with dialysis. Relative hypotension has precluded use of Imdur/hydralazine.  No ARNi/ARB/MRA with end-stage renal disease.  #Permanent atrial fibrillation Not a candidate for anticoagulation due to history of severe GI bleeding and severe anemia requiring transfusion.  Not a candidate for left atrial appendage occlusion given end-stage renal disease.  Cardiology will sign off for now.  Please call us with any questions or concerns.  For questions or updates, please contact [place] Please consult [email or web address] for contact info under    Signed, Vickie Epley, MD  04/01/2022 2:13 PM

## 2022-04-01 NOTE — ED Notes (Signed)
CRITICAL VALUE STICKER  CRITICAL VALUE:HGB 6.1  RECEIVER (on-site recipient of call):Lab  DATE & TIME NOTIFIED: 04/01/22 1214  MESSENGER (representative from lab):lab  MD NOTIFIED: [place]  TIME OF NOTIFICATION:1214  RESPONSE:

## 2022-04-01 NOTE — H&P (View-Only) (Signed)
Consultation Note   Referring Provider: [facility] PCP: [name], MD Primary Gastroenterologist: Dr. Lyndel Safe Reason for consultation: intraabdominal cyst  Hospital Day: 2  Assessment / Plan    # 66 yo male admitted with generalized abdominal pain, nausea, dry heaves with CT scan showing a new bilobed cyst in the central upper abdomen exerting a mass effect on adjacent organs. Possibly a pseudocyst but seems to more associated with the liver and stomach  Dr. Loletha Carrow will review CT scan. At this point it isn't clear if this is a pseudocyst.. Need more advanced imaging needed?  Role for EUS with aspiration?   # Mildly elevated AST of 79, normal ALT of 28. Non-drinker. Hepatic congestion on CTscan but mild AST elevation may not even bere liver related  # Elevated troponin ( high sensitivity 427 >> 378). Cardiology to see.  # Generalized pain, ? Sickle cell related pain. Hgb 7.8 >> 6.1.  Tbili 2.2. No overt GI bleeding.    # Prolonged QT interval  #  ESRD on HD T/T/S   # History of choledocholithiasis status post stone extraction / sphincterotomy, PD and CBD stent placement  followed later by cholecystectomy Dec 2022  See PMH for additional medical problems   HPI   Veasna D Christoffersen is a 66 y.o. male with a past medical history significant for gout,  ESRD on HD T/TH/Sa, CHF, A-fib, sickle cell disease, pulmonary hypertension, legally blind, chronic anemia, gastric AVMs, choledocholithiasis status post stone extraction/sphincterotomy in followed by cholecystectomy Dec 2022, recurrent SBOs status post small bowel resection.   See PMH for any additional medical problems.  We have seen him several times for chronic GI bleeding / Anemia. . He has undergone numerous endoscopic evaluations as listed in our 1/10/ 23 hospital consult note. Since then he has undergone two more EGDs in February 2023.   Today we are asked to see  patient for a different problem. He presented to ED yesterday with his chronic sickle cell pain, especially pain in his legs. Additionally he has been having intermittent nausea and  generalized abdominal pain for several days. The generalized abdominal pain is crampy in nature and it has no relationship to food. He has never had this type of abdominal pain. He was having dry heaves last weeks, now with just intermittent nausea. He he hasn't had much of an appetite.  Large cyst in upper abdomen on CT scan, felt to be a pseudocysts. No history of pancreatitis.    Significant studies: Alk phos 487, chronically elevated.  Tbili 2.2 - chronically elevated Lipase 93 WBC 7.5 Hgb 6.1 CT scan w contrast >>25.6 cm bilobed cyst in the central upper abdomen, most commonly reflecting a pseudocyst, less likely a mesenteric cyst. This is new from the prior CT. Associated mass effect on adjacent organs.   Previous GI Evaluation    Multiple endoscopic evaluations.   Recent Labs and Imaging CT Abdomen Pelvis W Contrast  Result Date: 03/31/2022 CLINICAL DATA:  Cough, sickle cell crisis, abdominal pain. EXAM: CT ANGIOGRAPHY CHEST CT ABDOMEN AND PELVIS WITH CONTRAST TECHNIQUE: Multidetector CT imaging of the chest was performed using the standard protocol during bolus administration of intravenous contrast. Multiplanar CT image reconstructions and MIPs were obtained  to evaluate the vascular anatomy. Multidetector CT imaging of the abdomen and pelvis was performed using the standard protocol during bolus administration of intravenous contrast. RADIATION DOSE REDUCTION: This exam was performed according to the departmental dose-optimization program which includes automated exposure control, adjustment of the mA and/or kV according to patient size and/or use of iterative reconstruction technique. CONTRAST:  171m OMNIPAQUE IOHEXOL 300 MG/ML  SOLN COMPARISON:  Chest radiograph dated 03/31/2022. CTA chest dated 08/29/2021.  FINDINGS: CTA CHEST FINDINGS Cardiovascular: Satisfactory opacification of the bilateral pulmonary arteries to the segmental level. No evidence of pulmonary embolism. Study is not tailored for evaluation of the thoracic aorta. No evidence of thoracic aortic aneurysm or dissection. Cardiomegaly.  No pericardial effusion. Mediastinum/Nodes: No suspicious mediastinal lymphadenopathy. Visualized thyroid is unremarkable. Lungs/Pleura: Evaluation of the lung parenchyma is constrained by respiratory motion. Within that constraint, there are no suspicious pulmonary nodules. Mild patchy bilateral lower lobe opacities with mild dependent left lower lobe opacity, likely atelectasis. No focal consolidation. No frank interstitial edema. Mild centrilobular emphysematous changes, upper lung predominant. Trace bilateral pleural effusions. No pneumothorax. Musculoskeletal: Vertebral body changes related to sickle cell disease. No focal osseous lesions. Review of the MIP images confirms the above findings. CT ABDOMEN and PELVIS FINDINGS Hepatobiliary: Liver is notable for changes of hepatic congestion. Status post cholecystectomy. No intrahepatic or extrahepatic ductal dilatation. Pancreas: Mild parenchymal atrophy. 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 Adrenals/Urinary Tract: Adrenal glands are within normal limits. Bilateral renal cysts, measuring up to 13 mm in the left upper kidney (series 6/image 22), measuring tubal low-density, benign (Bosniak I). No follow-up is recommended. No hydronephrosis. Bladder is underdistended but unremarkable. Stomach/Bowel: Mass effect on the stomach by the dominant upper abdominal bilobed cyst (described below). Surgical clips in the stomach (series 6/image 45). No evidence of bowel obstruction. Normal appendix (series 6/image 49). Suture line in the right lower abdomen (series 6/image 58). No colonic wall thickening or inflammatory changes. Vascular/Lymphatic: No evidence  of abdominal aortic aneurysm. Atherosclerotic calcifications of the abdominal aorta and branch vessels. No suspicious abdominopelvic lymphadenopathy. Reproductive: Prostate is unremarkable. Other: 15.3 x 25.6 x 19.0 cm bilobed cyst in the central upper abdomen (series 6/image 32), most associated with the liver and stomach on the current study, although statistically most commonly reflecting a pseudocyst, less likely a mesenteric cyst. This is new from the prior. Mass effect on adjacent organs. At the upper limits of normal for simple fluid density internally. No definite solid components or internal enhancement. Trace abdominopelvic ascites. Musculoskeletal: Vertebral body changes related to sickle cell disease. No focal osseous lesions. Review of the MIP images confirms the above findings. IMPRESSION: No evidence of pulmonary embolism. Cardiomegaly with trace bilateral pleural effusions. No frank interstitial edema. Mild bilateral lower lobe atelectasis. 25.6 cm bilobed cyst in the central upper abdomen, most commonly reflecting a pseudocyst, less likely a mesenteric cyst. This is new from the prior CT. Associated mass effect on adjacent organs. Consider EUS for cyst aspiration/sampling via the stomach. Otherwise, no acute findings in the abdomen/pelvis. Aortic Atherosclerosis (ICD10-I70.0) and Emphysema (ICD10-J43.9). Electronically Signed   By: SJulian HyM.D.   On: 03/31/2022 17:30   CT Angio Chest PE W and/or Wo Contrast  Result Date: 03/31/2022 CLINICAL DATA:  Cough, sickle cell crisis, abdominal pain. EXAM: CT ANGIOGRAPHY CHEST CT ABDOMEN AND PELVIS WITH CONTRAST TECHNIQUE: Multidetector CT imaging of the chest was performed using the standard protocol during bolus administration of intravenous contrast. Multiplanar CT image reconstructions and  MIPs were obtained to evaluate the vascular anatomy. Multidetector CT imaging of the abdomen and pelvis was performed using the standard protocol during  bolus administration of intravenous contrast. RADIATION DOSE REDUCTION: This exam was performed according to the departmental dose-optimization program which includes automated exposure control, adjustment of the mA and/or kV according to patient size and/or use of iterative reconstruction technique. CONTRAST:  134m OMNIPAQUE IOHEXOL 300 MG/ML  SOLN COMPARISON:  Chest radiograph dated 03/31/2022. CTA chest dated 08/29/2021. FINDINGS: CTA CHEST FINDINGS Cardiovascular: Satisfactory opacification of the bilateral pulmonary arteries to the segmental level. No evidence of pulmonary embolism. Study is not tailored for evaluation of the thoracic aorta. No evidence of thoracic aortic aneurysm or dissection. Cardiomegaly.  No pericardial effusion. Mediastinum/Nodes: No suspicious mediastinal lymphadenopathy. Visualized thyroid is unremarkable. Lungs/Pleura: Evaluation of the lung parenchyma is constrained by respiratory motion. Within that constraint, there are no suspicious pulmonary nodules. Mild patchy bilateral lower lobe opacities with mild dependent left lower lobe opacity, likely atelectasis. No focal consolidation. No frank interstitial edema. Mild centrilobular emphysematous changes, upper lung predominant. Trace bilateral pleural effusions. No pneumothorax. Musculoskeletal: Vertebral body changes related to sickle cell disease. No focal osseous lesions. Review of the MIP images confirms the above findings. CT ABDOMEN and PELVIS FINDINGS Hepatobiliary: Liver is notable for changes of hepatic congestion. Status post cholecystectomy. No intrahepatic or extrahepatic ductal dilatation. Pancreas: Mild parenchymal atrophy. 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 Adrenals/Urinary Tract: Adrenal glands are within normal limits. Bilateral renal cysts, measuring up to 13 mm in the left upper kidney (series 6/image 22), measuring tubal low-density, benign (Bosniak I). No follow-up is recommended. No  hydronephrosis. Bladder is underdistended but unremarkable. Stomach/Bowel: Mass effect on the stomach by the dominant upper abdominal bilobed cyst (described below). Surgical clips in the stomach (series 6/image 45). No evidence of bowel obstruction. Normal appendix (series 6/image 49). Suture line in the right lower abdomen (series 6/image 58). No colonic wall thickening or inflammatory changes. Vascular/Lymphatic: No evidence of abdominal aortic aneurysm. Atherosclerotic calcifications of the abdominal aorta and branch vessels. No suspicious abdominopelvic lymphadenopathy. Reproductive: Prostate is unremarkable. Other: 15.3 x 25.6 x 19.0 cm bilobed cyst in the central upper abdomen (series 6/image 32), most associated with the liver and stomach on the current study, although statistically most commonly reflecting a pseudocyst, less likely a mesenteric cyst. This is new from the prior. Mass effect on adjacent organs. At the upper limits of normal for simple fluid density internally. No definite solid components or internal enhancement. Trace abdominopelvic ascites. Musculoskeletal: Vertebral body changes related to sickle cell disease. No focal osseous lesions. Review of the MIP images confirms the above findings. IMPRESSION: No evidence of pulmonary embolism. Cardiomegaly with trace bilateral pleural effusions. No frank interstitial edema. Mild bilateral lower lobe atelectasis. 25.6 cm bilobed cyst in the central upper abdomen, most commonly reflecting a pseudocyst, less likely a mesenteric cyst. This is new from the prior CT. Associated mass effect on adjacent organs. Consider EUS for cyst aspiration/sampling via the stomach. Otherwise, no acute findings in the abdomen/pelvis. Aortic Atherosclerosis (ICD10-I70.0) and Emphysema (ICD10-J43.9). Electronically Signed   By: SJulian HyM.D.   On: 03/31/2022 17:30   DG Chest Port 1 View  Result Date: 03/31/2022 CLINICAL DATA:  Cough, sickle crisis EXAM:  PORTABLE CHEST 1 VIEW COMPARISON:  12/21/2021 FINDINGS: Cardiomegaly. Both lungs are clear. The visualized skeletal structures are unremarkable. IMPRESSION: Cardiomegaly without acute abnormality of the lungs in AP portable projection. Electronically Signed  By: Delanna Ahmadi M.D.   On: 03/31/2022 15:50    Labs:  Recent Labs    03/31/22 1427 03/31/22 1731 04/01/22 1158  WBC 7.5  --  7.8  HGB 6.3* 7.8* 6.1*  HCT 16.9* 23.0* 16.4*  PLT 191  --  174   Recent Labs    03/31/22 1427 03/31/22 1731  NA 132* 129*  K 3.5 3.7  CL 89*  --   CO2 19*  --   GLUCOSE 84  --   BUN 52*  --   CREATININE 7.35*  --   CALCIUM 9.1  --    Recent Labs    03/31/22 1427  PROT 8.1  ALBUMIN 3.8  AST 79*  ALT 28  ALKPHOS 487*  BILITOT 2.2*   No results for input(s): "HEPBSAG", "HCVAB", "HEPAIGM", "HEPBIGM" in the last 72 hours. Recent Labs    03/31/22 1427  LABPROT 17.2*  INR 1.4*    Past Medical History:  Diagnosis Date   A-fib ([place])    Blood dyscrasia    Blood transfusion    "I've had a bunch of them"   CHF (congestive heart failure) ([place])    followed by hf clinic   Chronic kidney disease (CKD), stage III (moderate) (HCC)    T-TH-S   Dialysis patient ([place]) 04/2019   AV fistula (right arm)   Dysrhythmia    A-Fib per patient   Gout    Headache(784.0)    "probably weekly" (01/13/2014)   Legally blind    Mitral regurgitation    Pulmonary hypertension (HCC)    Shortness of breath dyspnea    Sickle cell disease, type SS (HCC)    Swelling abdomen    Swelling of extremity, left    Swelling of extremity, right    Tricuspid regurgitation     Past Surgical History:  Procedure Laterality Date   A/V FISTULAGRAM Right 07/29/2020   Procedure: A/V FISTULAGRAM - Right Arm;  Surgeon: Angelia Mould, MD;  Location: [place] [facility];  Service: Cardiovascular;  Laterality: Right;   BASCILIC VEIN TRANSPOSITION Right 04/12/2014   Procedure: BASILIC VEIN TRANSPOSITION;  Surgeon:  Elam Dutch, MD;  Location: [place];  Service: Vascular;  Laterality: Right;   BIOPSY  08/04/2020   Procedure: BIOPSY;  Surgeon: Ladene Artist, MD;  Location: [place];  Service: Gastroenterology;;   BIOPSY  06/28/2021   Procedure: BIOPSY;  Surgeon: Jerene Bears, MD;  Location: [facility] ENDOSCOPY;  Service: Gastroenterology;;   BOWEL RESECTION N/A 05/24/2021   Procedure: SMALL BOWEL RESECTION;  Surgeon: Rolm Bookbinder, MD;  Location: [place];  Service: General;  Laterality: N/A;   CARDIOVERSION N/A 06/05/2018   Procedure: CARDIOVERSION;  Surgeon: Jolaine Artist, MD;  Location: [place];  Service: Cardiovascular;  Laterality: N/A;   CARDIOVERSION N/A 10/06/2019   Procedure: CARDIOVERSION;  Surgeon: Jolaine Artist, MD;  Location: [facility] ENDOSCOPY;  Service: Cardiovascular;  Laterality: N/A;   CHOLECYSTECTOMY N/A 05/24/2021   Procedure: OPEN CHOLECYSTECTOMY;  Surgeon: Rolm Bookbinder, MD;  Location: [place];  Service: General;  Laterality: N/A;   COLONOSCOPY WITH PROPOFOL N/A 09/24/2017   Procedure: COLONOSCOPY WITH PROPOFOL;  Surgeon: Jackquline Denmark, MD;  Location: [place];  Service: Endoscopy;  Laterality: N/A;   ESOPHAGOGASTRODUODENOSCOPY N/A 09/19/2017   Procedure: ESOPHAGOGASTRODUODENOSCOPY (EGD);  Surgeon: Jackquline Denmark, MD;  Location: [facility] ENDOSCOPY;  Service: Endoscopy;  Laterality: N/A;   ESOPHAGOGASTRODUODENOSCOPY N/A 05/29/2021   Procedure: ESOPHAGOGASTRODUODENOSCOPY (EGD);  Surgeon: Milus Banister, MD;  Location: [facility]  ENDOSCOPY;  Service: Gastroenterology;  Laterality: N/A;   ESOPHAGOGASTRODUODENOSCOPY (EGD) WITH PROPOFOL N/A 08/04/2020   Procedure: ESOPHAGOGASTRODUODENOSCOPY (EGD) WITH PROPOFOL;  Surgeon: Ladene Artist, MD;  Location: [place];  Service: Gastroenterology;  Laterality: N/A;   ESOPHAGOGASTRODUODENOSCOPY (EGD) WITH PROPOFOL N/A 06/28/2021   Procedure: ESOPHAGOGASTRODUODENOSCOPY (EGD) WITH PROPOFOL;  Surgeon: Jerene Bears, MD;  Location: [facility]  ENDOSCOPY;  Service: Gastroenterology;  Laterality: N/A;   ESOPHAGOGASTRODUODENOSCOPY (EGD) WITH PROPOFOL N/A 08/10/2021   Procedure: ESOPHAGOGASTRODUODENOSCOPY (EGD) WITH PROPOFOL;  Surgeon: Jackquline Denmark, MD;  Location: [place];  Service: Gastroenterology;  Laterality: N/A;   ESOPHAGOGASTRODUODENOSCOPY (EGD) WITH PROPOFOL N/A 08/02/2021   Procedure: ESOPHAGOGASTRODUODENOSCOPY (EGD) WITH PROPOFOL;  Surgeon: Lavena Bullion, DO;  Location: [place];  Service: Gastroenterology;  Laterality: N/A;   HEMOSTASIS CLIP PLACEMENT  05/29/2021   Procedure: HEMOSTASIS CLIP PLACEMENT;  Surgeon: Milus Banister, MD;  Location: [facility] ENDOSCOPY;  Service: Gastroenterology;;   HOT HEMOSTASIS N/A 05/29/2021   Procedure: HOT HEMOSTASIS (ARGON PLASMA COAGULATION/BICAP);  Surgeon: Milus Banister, MD;  Location: [facility] ENDOSCOPY;  Service: Gastroenterology;  Laterality: N/A;   HOT HEMOSTASIS N/A 06/28/2021   Procedure: HOT HEMOSTASIS (ARGON PLASMA COAGULATION/BICAP);  Surgeon: Jerene Bears, MD;  Location: [facility] ENDOSCOPY;  Service: Gastroenterology;  Laterality: N/A;   HOT HEMOSTASIS N/A 08/10/2021   Procedure: HOT HEMOSTASIS (ARGON PLASMA COAGULATION/BICAP);  Surgeon: Jackquline Denmark, MD;  Location: [facility] ENDOSCOPY;  Service: Gastroenterology;  Laterality: N/A;   HOT HEMOSTASIS N/A 08/02/2021   Procedure: HOT HEMOSTASIS (ARGON PLASMA COAGULATION/BICAP);  Surgeon: Lavena Bullion, DO;  Location: [facility] ENDOSCOPY;  Service: Gastroenterology;  Laterality: N/A;   IR PERC TUN PERIT CATH WO PORT S&I /IMAG  05/23/2021   IR REMOVAL TUN CV CATH W/O FL  06/10/2021   IR US GUIDE VASC ACCESS RIGHT  05/23/2021   LAPAROSCOPY N/A 05/24/2021   Procedure: LAPAROSCOPY DIAGNOSTIC;  Surgeon: Rolm Bookbinder, MD;  Location: [place];  Service: General;  Laterality: N/A;   LAPAROTOMY N/A 05/24/2021   Procedure: EXPLORATORY LAPAROTOMY;  Surgeon: Rolm Bookbinder, MD;  Location: [place];  Service: General;  Laterality: N/A;   LEFT AND  RIGHT HEART CATHETERIZATION WITH CORONARY ANGIOGRAM N/A 06/11/2011   Procedure: LEFT AND RIGHT HEART CATHETERIZATION WITH CORONARY ANGIOGRAM;  Surgeon: Larey Dresser, MD;  Location: [facility] CATH LAB;  Service: Cardiovascular;  Laterality: N/A;   PERIPHERAL VASCULAR BALLOON ANGIOPLASTY Right 07/29/2020   Procedure: PERIPHERAL VASCULAR BALLOON ANGIOPLASTY;  Surgeon: Angelia Mould, MD;  Location: [place] [facility];  Service: Cardiovascular;  Laterality: Right;  arm fistula   REVISON OF ARTERIOVENOUS FISTULA Right 08/08/2020   Procedure: ANEURYSM EXCISION OF RIGHT UPPER EXTREMITY ARTERIOVENOUS FISTULA;  Surgeon: Angelia Mould, MD;  Location: [place];  Service: Vascular;  Laterality: Right;   REVISON OF ARTERIOVENOUS FISTULA Right 03/21/2022   Procedure: EXCISION OF ULCERATED SKIN OF RIGHT ARTERIOVENOUS FISTULA REVISION;  Surgeon: Marty Heck, MD;  Location: [facility] OR;  Service: Vascular;  Laterality: Right;   SCLEROTHERAPY  05/29/2021   Procedure: Clide Deutscher;  Surgeon: Milus Banister, MD;  Location: [facility] ENDOSCOPY;  Service: Gastroenterology;;   Lavell Islam REMOVAL  05/29/2021   Procedure: STENT REMOVAL;  Surgeon: Milus Banister, MD;  Location: [place];  Service: Gastroenterology;;   TEE WITHOUT CARDIOVERSION N/A 12/27/2015   Procedure: TRANSESOPHAGEAL ECHOCARDIOGRAM (TEE);  Surgeon: Sueanne Margarita, MD;  Location: [facility] ENDOSCOPY;  Service: Cardiovascular;  Laterality: N/A;    Family History  Problem Relation Age of Onset   Alcohol abuse Mother  Liver disease Mother    Cirrhosis Mother    Heart attack Mother     Prior to Admission medications   Medication Sig Start Date End Date Taking? Authorizing Provider  acetaminophen (TYLENOL) 500 MG tablet Take 2,000 mg by mouth in the morning, at noon, and at bedtime.   Yes [provider]  allopurinol (ZYLOPRIM) 100 MG tablet TAKE 1 TABLET BY MOUTH EVERY DAY Patient taking differently: Take 100 mg by mouth daily. 02/08/22   Yes Fenton Foy, NP  AURYXIA 1 GM 210 MG(Fe) tablet Take 210 mg by mouth in the morning and at bedtime. 02/11/21  Yes [provider]  Dextromethorphan-guaiFENesin [facility] COUGH & CHEST CONGEST PO) Take 1 Capful by mouth 3 (three) times daily as needed (cough).   Yes [provider]  diphenhydramine-acetaminophen (TYLENOL PM) 25-500 MG TABS tablet Take 2 tablets by mouth at bedtime as needed (sleep/pain).   Yes [provider]  folic acid (FOLVITE) 1 MG tablet TAKE 1 TABLET BY MOUTH EVERY DAY Patient taking differently: Take 1 mg by mouth daily. 02/22/21  Yes Dorena Dew, FNP  hydroxyurea (HYDREA) 500 MG capsule Take 1 capsule (500 mg total) by mouth daily. 11/08/21  Yes Dorena Dew, FNP  lidocaine-prilocaine (EMLA) cream Apply 1 application. topically Every Tuesday,Thursday,and Saturday with dialysis. 09/08/19  Yes [provider]  Oxycodone HCl 10 MG TABS Take 1 tablet (10 mg total) by mouth every 6 (six) hours as needed. Patient taking differently: Take 10 mg by mouth 2 (two) times daily as needed (pain). 03/27/22  Yes Dorena Dew, FNP  pantoprazole (PROTONIX) 40 MG tablet TAKE 1 TABLET BY MOUTH TWICE A DAY Patient taking differently: Take 40 mg by mouth 2 (two) times daily. 01/15/22  Yes Willia Craze, NP  tiZANidine (ZANAFLEX) 4 MG tablet TAKE 1/2 TABLET (2MG TOTAL) BY MOUTH EVERY 8 HOURS AS NEEDED FOR MUSCLE SPASM Patient taking differently: Take 2 mg by mouth 3 (three) times daily as needed for muscle spasms. 03/27/22  Yes Dorena Dew, FNP  torsemide (DEMADEX) 20 MG tablet Take 20 mg by mouth in the morning, at noon, in the evening, and at bedtime. 09/03/21  Yes [provider]  fluticasone (FLONASE) 50 MCG/ACT nasal spray Place 2 sprays into both nostrils daily. Patient not taking: Reported on 04/01/2022 09/04/21   Bo Merino I, NP  Nutritional Supplements (,FEEDING SUPPLEMENT, PROSOURCE PLUS) liquid Take 30 mLs by  mouth 2 (two) times daily between meals. Patient not taking: Reported on 04/01/2022 08/30/21   Terrilee Croak, MD  Vitamin D, Ergocalciferol, (DRISDOL) 1.25 MG (50000 UNIT) CAPS capsule Take 1 capsule (50,000 Units total) by mouth once a week. Monday Patient not taking: Reported on 04/01/2022 11/08/21   Dorena Dew, FNP    Current Facility-Administered Medications  Medication Dose Route Frequency Provider Last Rate Last Admin   acetaminophen (TYLENOL) tablet 650 mg  650 mg Oral Q6H PRN Norval Morton, MD       Or   acetaminophen (TYLENOL) suppository 650 mg  650 mg Rectal Q6H PRN Fuller Plan A, MD       albuterol (PROVENTIL) (2.5 MG/3ML) 0.083% nebulizer solution 2.5 mg  2.5 mg Nebulization Q6H PRN Fuller Plan A, MD       aspirin tablet 325 mg  325 mg Oral Daily Regan Lemming, MD   325 mg at 04/01/22 0909   HYDROmorphone (DILAUDID) injection 1 mg  1 mg Intravenous D6U PRN Delora Fuel, MD  1 mg at 04/01/22 1433   hydroxyurea (HYDREA) capsule 500 mg  500 mg Oral Daily Smith, Rondell A, MD       oxyCODONE (Oxy IR/ROXICODONE) immediate release tablet 10 mg  10 mg Oral BID PRN Fuller Plan A, MD       sodium chloride flush (NS) 0.9 % injection 3 mL  3 mL Intravenous Q12H Smith, Rondell A, MD   3 mL at 04/01/22 1436   torsemide (DEMADEX) tablet 20 mg  20 mg Oral QID Norval Morton, MD        Allergies as of 03/31/2022   (No Known Allergies)    Social History   Socioeconomic History   Marital status: Divorced    Spouse name: Not on file   Number of children: Not on file   Years of education: Not on file   Highest education level: Not on file  Occupational History   Occupation: disabled  Tobacco Use   Smoking status: Former    Packs/day: 0.50    Years: 41.00    Total pack years: 20.50    Types: Cigarettes    Quit date: 12/08/2015    Years since quitting: 6.3   Smokeless tobacco: Never  Vaping Use   Vaping Use: Never used  Substance and Sexual Activity   Alcohol  use: Not Currently    Alcohol/week: 3.0 standard drinks of alcohol    Types: 1 Glasses of wine, 2 Cans of beer per week    Comment: none since starting dialysis 06/2021   Drug use: No   Sexual activity: Not Currently  Other Topics Concern   Not on file  Social History Narrative   Not on file   Social Determinants of Health   Financial Resource Strain: Low Risk  (06/02/2021)   Overall Financial Resource Strain (CARDIA)    Difficulty of Paying Living Expenses: Not very hard  Food Insecurity: No Food Insecurity (06/02/2021)   Hunger Vital Sign    Worried About Running Out of Food in the Last Year: Never true    Ran Out of Food in the Last Year: Never true  Transportation Needs: No Transportation Needs (06/02/2021)   PRAPARE - Hydrologist (Medical): No    Lack of Transportation (Non-Medical): No  Physical Activity: Sufficiently Active (10/01/2019)   Exercise Vital Sign    Days of Exercise per Week: 5 days    Minutes of Exercise per Session: 90 min  Stress: Not on file  Social Connections: Not on file  Intimate Partner Violence: Not on file    Review of Systems: All systems reviewed and negative except where noted in HPI.  Physical Exam: Vital signs in last 24 hours: Temp:  [97.7 F (36.5 C)-98.7 F (37.1 C)] 97.7 F (36.5 C) (10/15 1242) Pulse Rate:  [77-107] 97 (10/15 1242) Resp:  [10-22] 18 (10/15 1242) BP: (97-115)/(58-75) 113/75 (10/15 1242) SpO2:  [93 %-100 %] 98 % (10/15 1242) Weight:  [63.7 kg] 63.7 kg (10/15 1242)    General:  Alert male in NAD Psych:  Pleasant, cooperative. Normal mood and affect Eyes: Pupils equal Ears:  Normal auditory acuity Nose: No deformity, discharge or lesions Neck:  Supple, no masses felt Lungs:  Clear to auscultation.  Heart:  Regular rate,nNo lower extremity edema Abdomen:  Soft, moderately distended , nontender, active bowel sounds. LUQ with a palpable mass.  Rectal :  Deferred Msk: Symmetrical  without gross deformities.  Neurologic:  Alert, oriented, grossly normal neurologically  Skin:  Intact without significant lesions.    Intake/Output from previous day: 10/14 0701 - 10/15 0700 In: 1000 [IV Piggyback:1000] Out: -  Intake/Output this shift:  Total I/O In: 120 [P.O.:120] Out: 0     Active Problems:   Sickle cell crisis ([place])    Tye Savoy, NP-C @  04/01/2022, 2:57 PM   I have taken an interval history, thoroughly reviewed the chart and examined the patient. I agree with the Advanced Practitioner's note, impression and recommendations, and have recorded additional findings, impressions and recommendations below. I performed a substantive portion of this encounter (>50% time spent), including a complete performance of the medical decision making.  My additional thoughts are as follows:  I am unable to open the scan images for review, but the report indicates that radiology is unable to determine the origin of this very large upper abdominal cystic finding.  Our incoming surgical consult team managed by Dr. Fuller Plan can review images with radiology and our advanced endoscopist (EUS practitioner) to determine the best approach.  Unclear to me at this time if EUS is right for this.   Nelida Meuse III [phone]

## 2022-04-01 NOTE — H&P (Signed)
History and Physical    Patient: Patrick [name]:[id number] DOB: 11/03/1955 DOA: 03/31/2022 DOS: the patient was seen and examined on 04/01/2022 PCP: [name], MD  Patient coming from: [facility] transfer  Chief Complaint:  Chief Complaint  Patient presents with   Sickle Cell Pain Crisis   HPI: Patrick [name] is a 66 y.o. male with medical history significant of sickle cell anemia, paroxysmal atrial fibrillation, chronic pain syndrome on chronic opioids, ESRD on HD TTS, legally blind, history of GI bleed, former smoker who presented with complaints of back and leg pain starting 3 days ago.  Pain is in his entire back and bilateral legs.  Noted symptoms were not relieved with his home medications of oxycodone.  Patient thought he was going into sickle cell crisis.  Notes associated symptoms of an new minimally productive cough, nausea,  decreased appetite over the last week,  dry heaves, and intermittent crampy epigastric pain.  Denies having any significant fever, chest pain, or palpitations to his knowledge.  He had tried taking Mucinex without any significant improvement in symptoms.  He is unsure if he has had any blood in his stools close his vision is poor at baseline.  He last dialyzed on 10/12 and reported that he was underweight at that time.  He did not go to dialysis yesterday as he was underweight previously.  Patient notes that he still does make some urine but not very much for which she is on furosemide.  Upon plan admission into the emergency department patient was noted to be tachycardiac and tachypneic and all other vital signs maintained.  Labs from 10/14 significant for hemoglobin 6.3->6.1, sodium 132, sodium 132, CO2 19, BUN 52, creatinine 7.35, anion gap 24, alkaline phosphatase 487 lipase 93,, AST 79 total bilirubin 2.2, BNP 3312.5, and high-sensitivity troponin 427->378.  CT chest/abdomen/pelvis noted no signs of PE,  25.6 cm cyst in the upper abdomen. with  cardiomegaly, and trace bilateral effusions. Patient has been given 500 mL of IV fluids, 325 mg of aspirin p.o., and Dilaudid for pain  Review of Systems: As mentioned in the history of present illness. All other systems reviewed and are negative. Past Medical History:  Diagnosis Date   A-fib ([place])    Blood dyscrasia    Blood transfusion    "I've had a bunch of them"   CHF (congestive heart failure) ([place])    followed by hf clinic   Chronic kidney disease (CKD), stage III (moderate) (HCC)    T-TH-S   Dialysis patient ([place]) 04/2019   AV fistula (right arm)   Dysrhythmia    A-Fib per patient   Gout    Headache(784.0)    "probably weekly" (01/13/2014)   Legally blind    Mitral regurgitation    Pulmonary hypertension (HCC)    Shortness of breath dyspnea    Sickle cell disease, type SS (HCC)    Swelling abdomen    Swelling of extremity, left    Swelling of extremity, right    Tricuspid regurgitation    Past Surgical History:  Procedure Laterality Date   A/V FISTULAGRAM Right 07/29/2020   Procedure: A/V FISTULAGRAM - Right Arm;  Surgeon: Angelia Mould, MD;  Location: [place] [facility];  Service: Cardiovascular;  Laterality: Right;   BASCILIC VEIN TRANSPOSITION Right 04/12/2014   Procedure: BASILIC VEIN TRANSPOSITION;  Surgeon: Elam Dutch, MD;  Location: [place];  Service: Vascular;  Laterality: Right;   BIOPSY  08/04/2020   Procedure: BIOPSY;  Surgeon: Ladene Artist, MD;  Location: [place];  Service: Gastroenterology;;   BIOPSY  06/28/2021   Procedure: BIOPSY;  Surgeon: Jerene Bears, MD;  Location: [facility] ENDOSCOPY;  Service: Gastroenterology;;   BOWEL RESECTION N/A 05/24/2021   Procedure: SMALL BOWEL RESECTION;  Surgeon: Rolm Bookbinder, MD;  Location: [place];  Service: General;  Laterality: N/A;   CARDIOVERSION N/A 06/05/2018   Procedure: CARDIOVERSION;  Surgeon: Jolaine Artist, MD;  Location: [facility] ENDOSCOPY;  Service: Cardiovascular;  Laterality: N/A;    CARDIOVERSION N/A 10/06/2019   Procedure: CARDIOVERSION;  Surgeon: Jolaine Artist, MD;  Location[facility] ENDOSCOPY;  Service: Cardiovascular;  Laterality: N/A;   CHOLECYSTECTOMY N/A 05/24/2021   Procedure: OPEN CHOLECYSTECTOMY;  Surgeon: Rolm Bookbinder, MD;  Location: [place];  Service: General;  Laterality: N/A;   COLONOSCOPY WITH PROPOFOL N/A 09/24/2017   Procedure: COLONOSCOPY WITH PROPOFOL;  Surgeon: Jackquline Denmark, MD;  Location: [place];  Service: Endoscopy;  Laterality: N/A;   ESOPHAGOGASTRODUODENOSCOPY N/A 09/19/2017   Procedure: ESOPHAGOGASTRODUODENOSCOPY (EGD);  Surgeon: Jackquline Denmark, MD;  Location: [facility] ENDOSCOPY;  Service: Endoscopy;  Laterality: N/A;   ESOPHAGOGASTRODUODENOSCOPY N/A 05/29/2021   Procedure: ESOPHAGOGASTRODUODENOSCOPY (EGD);  Surgeon: Milus Banister, MD;  Location: [facility] ENDOSCOPY;  Service: Gastroenterology;  Laterality: N/A;   ESOPHAGOGASTRODUODENOSCOPY (EGD) WITH PROPOFOL N/A 08/04/2020   Procedure: ESOPHAGOGASTRODUODENOSCOPY (EGD) WITH PROPOFOL;  Surgeon: Ladene Artist, MD;  Location: [place];  Service: Gastroenterology;  Laterality: N/A;   ESOPHAGOGASTRODUODENOSCOPY (EGD) WITH PROPOFOL N/A 06/28/2021   Procedure: ESOPHAGOGASTRODUODENOSCOPY (EGD) WITH PROPOFOL;  Surgeon: Jerene Bears, MD;  Location: [facility] ENDOSCOPY;  Service: Gastroenterology;  Laterality: N/A;   ESOPHAGOGASTRODUODENOSCOPY (EGD) WITH PROPOFOL N/A 08/10/2021   Procedure: ESOPHAGOGASTRODUODENOSCOPY (EGD) WITH PROPOFOL;  Surgeon: Jackquline Denmark, MD;  Location: [place];  Service: Gastroenterology;  Laterality: N/A;   ESOPHAGOGASTRODUODENOSCOPY (EGD) WITH PROPOFOL N/A 08/02/2021   Procedure: ESOPHAGOGASTRODUODENOSCOPY (EGD) WITH PROPOFOL;  Surgeon: Lavena Bullion, DO;  Location: [place];  Service: Gastroenterology;  Laterality: N/A;   HEMOSTASIS CLIP PLACEMENT  05/29/2021   Procedure: HEMOSTASIS CLIP PLACEMENT;  Surgeon: Milus Banister, MD;  Location: [facility] ENDOSCOPY;  Service:  Gastroenterology;;   HOT HEMOSTASIS N/A 05/29/2021   Procedure: HOT HEMOSTASIS (ARGON PLASMA COAGULATION/BICAP);  Surgeon: Milus Banister, MD;  Location: [facility] ENDOSCOPY;  Service: Gastroenterology;  Laterality: N/A;   HOT HEMOSTASIS N/A 06/28/2021   Procedure: HOT HEMOSTASIS (ARGON PLASMA COAGULATION/BICAP);  Surgeon: Jerene Bears, MD;  Location: [facility] ENDOSCOPY;  Service: Gastroenterology;  Laterality: N/A;   HOT HEMOSTASIS N/A 08/10/2021   Procedure: HOT HEMOSTASIS (ARGON PLASMA COAGULATION/BICAP);  Surgeon: Jackquline Denmark, MD;  Location: [facility] ENDOSCOPY;  Service: Gastroenterology;  Laterality: N/A;   HOT HEMOSTASIS N/A 08/02/2021   Procedure: HOT HEMOSTASIS (ARGON PLASMA COAGULATION/BICAP);  Surgeon: Lavena Bullion, DO;  Location: [facility] ENDOSCOPY;  Service: Gastroenterology;  Laterality: N/A;   IR PERC TUN PERIT CATH WO PORT S&I /IMAG  05/23/2021   IR REMOVAL TUN CV CATH W/O FL  06/10/2021   IR US GUIDE VASC ACCESS RIGHT  05/23/2021   LAPAROSCOPY N/A 05/24/2021   Procedure: LAPAROSCOPY DIAGNOSTIC;  Surgeon: Rolm Bookbinder, MD;  Location: [place];  Service: General;  Laterality: N/A;   LAPAROTOMY N/A 05/24/2021   Procedure: EXPLORATORY LAPAROTOMY;  Surgeon: Rolm Bookbinder, MD;  Location: [place];  Service: General;  Laterality: N/A;   LEFT AND RIGHT HEART CATHETERIZATION WITH CORONARY ANGIOGRAM N/A 06/11/2011   Procedure: LEFT AND RIGHT HEART CATHETERIZATION WITH CORONARY ANGIOGRAM;  Surgeon: Larey Dresser, MD;  Location:  [place] CATH LAB;  Service: Cardiovascular;  Laterality: N/A;   PERIPHERAL VASCULAR BALLOON ANGIOPLASTY Right 07/29/2020   Procedure: PERIPHERAL VASCULAR BALLOON ANGIOPLASTY;  Surgeon: Angelia Mould, MD;  Location: [place] [facility];  Service: Cardiovascular;  Laterality: Right;  arm fistula   REVISON OF ARTERIOVENOUS FISTULA Right 08/08/2020   Procedure: ANEURYSM EXCISION OF RIGHT UPPER EXTREMITY ARTERIOVENOUS FISTULA;  Surgeon: Angelia Mould, MD;  Location:  [place];  Service: Vascular;  Laterality: Right;   REVISON OF ARTERIOVENOUS FISTULA Right 03/21/2022   Procedure: EXCISION OF ULCERATED SKIN OF RIGHT ARTERIOVENOUS FISTULA REVISION;  Surgeon: Marty Heck, MD;  Location: [facility] OR;  Service: Vascular;  Laterality: Right;   SCLEROTHERAPY  05/29/2021   Procedure: Clide Deutscher;  Surgeon: Milus Banister, MD;  Location: [facility] ENDOSCOPY;  Service: Gastroenterology;;   Lavell Islam REMOVAL  05/29/2021   Procedure: STENT REMOVAL;  Surgeon: Milus Banister, MD;  Location: [place];  Service: Gastroenterology;;   TEE WITHOUT CARDIOVERSION N/A 12/27/2015   Procedure: TRANSESOPHAGEAL ECHOCARDIOGRAM (TEE);  Surgeon: Sueanne Margarita, MD;  Location: [facility] ENDOSCOPY;  Service: Cardiovascular;  Laterality: N/A;   Social History:  reports that he quit smoking about 6 years ago. His smoking use included cigarettes. He has a 20.50 pack-year smoking history. He has never used smokeless tobacco. He reports that he does not currently use alcohol after a past usage of about 3.0 standard drinks of alcohol per week. He reports that he does not use drugs.  No Known Allergies  Family History  Problem Relation Age of Onset   Alcohol abuse Mother    Liver disease Mother    Cirrhosis Mother    Heart attack Mother     Prior to Admission medications   Medication Sig Start Date End Date Taking? Authorizing Provider  acetaminophen (TYLENOL) 500 MG tablet Take 1,000 mg by mouth every 6 (six) hours as needed for moderate pain.    [provider]  allopurinol (ZYLOPRIM) 100 MG tablet TAKE 1 TABLET BY MOUTH EVERY DAY 02/08/22   Fenton Foy, NP  AURYXIA 1 GM 210 MG(Fe) tablet Take 210 mg by mouth 3 (three) times daily. 02/11/21   [provider]  fluticasone (FLONASE) 50 MCG/ACT nasal spray Place 2 sprays into both nostrils daily. Patient taking differently: Place 2 sprays into both nostrils daily as needed. 09/04/21   Bo Merino I, NP  folic acid  (FOLVITE) 1 MG tablet TAKE 1 TABLET BY MOUTH EVERY DAY 02/22/21   Dorena Dew, FNP  hydroxyurea (HYDREA) 500 MG capsule Take 1 capsule (500 mg total) by mouth daily. 11/08/21   Dorena Dew, FNP  lidocaine-prilocaine (EMLA) cream Apply 1 application. topically Every Tuesday,Thursday,and Saturday with dialysis. 09/08/19   [provider]  Nutritional Supplements (,FEEDING SUPPLEMENT, PROSOURCE PLUS) liquid Take 30 mLs by mouth 2 (two) times daily between meals. 08/30/21   Terrilee Croak, MD  Oxycodone HCl 10 MG TABS Take 1 tablet (10 mg total) by mouth every 6 (six) hours as needed. 03/27/22   Dorena Dew, FNP  pantoprazole (PROTONIX) 40 MG tablet TAKE 1 TABLET BY MOUTH TWICE A DAY 01/15/22   Willia Craze, NP  tiZANidine (ZANAFLEX) 4 MG tablet TAKE 1/2 TABLET (2MG  TOTAL) BY MOUTH EVERY 8 HOURS AS NEEDED FOR MUSCLE SPASM 03/27/22   Dorena Dew, FNP  torsemide (DEMADEX) 20 MG tablet Take 20 mg by mouth in the morning, at noon, in the evening, and at bedtime. 09/03/21   [provider]  Vitamin D, Ergocalciferol, (DRISDOL) 1.25 MG (50000 UNIT) CAPS capsule Take 1 capsule (50,000 Units total) by mouth once a week. Monday 11/08/21   Dorena Dew, FNP    Physical Exam: Vitals:   04/01/22 1000 04/01/22 1027 04/01/22 1158 04/01/22 1242  BP:   107/74 113/75  Pulse:   86 97  Resp:   18 18  Temp: 98.7 F (37.1 C) 98.1 F (36.7 C) 98.1 F (36.7 C) 97.7 F (36.5 C)  TempSrc: Oral Oral Oral Oral  SpO2:   99% 98%  Weight:    67.1 kg  Height:    5\' 11"  (1.803 m)   Exam  Constitutional: Elderly male appears chronically ill in no acute distress Eyes: PERRL, lids and conjunctivae normal ENMT: Mucous membranes are moist.  Neck: normal, supple, no masses, no thyromegaly Respiratory: clear to auscultation bilaterally, no wheezing, no crackles. Normal respiratory effort. No accessory muscle use.  Cardiovascular: Irregularly irregular,   No extremity edema. 2+ pedal  pulses.  Left upper extremity fistula in place Abdomen: epigastric abdominal tenderness appreciated.  Bowel sounds positive.  Musculoskeletal: no clubbing / cyanosis. No joint deformity upper and lower extremities. \ Skin: no rashes, lesions, ulcers. No induration Neurologic: CN 2-12 grossly intact. Strength 5/5 in all 4.  Psychiatric: Normal judgment and insight. Alert and oriented x 3. Normal mood.   Data Reviewed:  EKG revealed atrial fibrillation at 84 bpm with prolonged QT at 555.  Reviewed labs, imaging, and pertinent records as noted above in HPI.  Assessment and Plan: Sickle cell anemia with pain crisis Acute.  Patient presents with complaints of pain in his back and bilateral legs for the last 3 days.  Pain not relieved by home oxycodone.  Hemoglobin noted to be as low as 6.3->6.1.  He reports that he is usually transfuse when hemoglobin drops down to 6.  He does not know if he has had any bleeding due to his poor eyesight.. -Admit to a telemetry bed -Type and screen and transfuse 1 unit of PRBCs.  Continue to monitor and transfuse as needed to attain hemoglobin greater than 6 g/dL -Check stool guaiac -Continue home oxycodone as needed -Dilaudid IV as needed for pain  Elevated troponin Acute.  High-sensitivity troponin initially 427-> 378.  EKG noted atrial fibrillation with right axis deviation.  Denies having any specific chest pain.   -Check echocardiogram -Cardiology consulted, will follow-up for any further recommendations  Persistent atrial fibrillation Patient is not normally on any rate controlling medications and denies having any palpitations.  He is not on any anticoagulation at baseline due to GI bleeding history.  Heart rates noted to be into the 130s. -Cardiology consulted,  will follow-up for any further recommendations  Abdominal fluid collection nausea and abdominal discomfort Patient has had intermittent gastric abdominal cramping, nausea, dry heaves, and  decreased appetite.  Lipase elevated at 93.  CT noted to have 25.6 cm bilobed cystic in the upper abdomen for which EUS recommended for further evaluation and sampling. -Tigan as needed for nausea/vomiting -Gastroenterology consulted, follow-up for any further recommendations  ESRD on HD Patient normally dialyzes Tuesday, Thursday, Saturday.  Last session on 10/12 for he reportedly was under his dry weight.  CT imaging noted signs of small bilateral pleural effusions. -Continue torsemide -Dr. Jonnie Finner of nephrology consulted, dialysis per their recommendations  Cough CT scan of the chest noted bilateral lower lobe atelectasis without signs of a PE. -Incentive spirometry and flutter valve -Check procalcitonin  Systolic congestive  heart failure Patient without significant lower extremity edema on physical exam.  Last EF noted to be 40 to 45% back in 2022.  BNP  elevated at 3312.5, but O2 saturation currently maintained on room air. -Fluid management per dialysis  Essential hypertension Blood pressures currently maintained.  Metabolic acidosis with elevated anion gap CO2 19 with anion gap of 24.  Thought secondary to patient's renal failure as glucose within normal limits. -Monitor  Prolonged QT interval Chronic.  QTc 557 -Avoid any additional QT prolonging medications -Correct any electrolyte abnormalities  Elevated LFTs Acute on chronic.  Labs revealed alkaline phosphatase 487, AST 79, and total bilirubin 2.2.  Alkaline phosphatase and total bilirubin have been elevated previously in July. -Monitor  DVT prophylaxis: SCDs Advance Care Planning:   Code Status: DNR   Consults: GI and cardiology  Family Communication: Unable to reach 5 5  Severity of Illness: The appropriate patient status for this patient is INPATIENT. Inpatient status is judged to be reasonable and necessary in order to provide the required intensity of service to ensure the patient's safety. The patient's  presenting symptoms, physical exam findings, and initial radiographic and laboratory data in the context of their chronic comorbidities is felt to place them at high risk for further clinical deterioration. Furthermore, it is not anticipated that the patient will be medically stable for discharge from the hospital within 2 midnights of admission.   * I certify that at the point of admission it is my clinical judgment that the patient will require inpatient hospital care spanning beyond 2 midnights from the point of admission due to high intensity of service, high risk for further deterioration and high frequency of surveillance required.*  Author: Norval Morton, MD 04/01/2022 12:52 PM  For on call review [email or web address].

## 2022-04-02 ENCOUNTER — Inpatient Hospital Stay (HOSPITAL_COMMUNITY): Payer: HMO | Admitting: Certified Registered"

## 2022-04-02 ENCOUNTER — Encounter (HOSPITAL_COMMUNITY)
Admission: EM | Disposition: A | Discharge status: Home / Self Care | Payer: Self-pay | Source: Home / Self Care | Attending: Internal Medicine

## 2022-04-02 ENCOUNTER — Encounter (HOSPITAL_COMMUNITY): Payer: Self-pay | Admitting: Internal Medicine

## 2022-04-02 ENCOUNTER — Inpatient Hospital Stay (HOSPITAL_COMMUNITY): Payer: HMO

## 2022-04-02 DIAGNOSIS — Z87891 Personal history of nicotine dependence: Secondary | ICD-10-CM

## 2022-04-02 DIAGNOSIS — K862 Cyst of pancreas: Secondary | ICD-10-CM | POA: Diagnosis not present

## 2022-04-02 DIAGNOSIS — D5700 Hb-SS disease with crisis, unspecified: Secondary | ICD-10-CM | POA: Diagnosis not present

## 2022-04-02 DIAGNOSIS — K2289 Other specified disease of esophagus: Secondary | ICD-10-CM

## 2022-04-02 DIAGNOSIS — I5022 Chronic systolic (congestive) heart failure: Secondary | ICD-10-CM | POA: Diagnosis not present

## 2022-04-02 DIAGNOSIS — K449 Diaphragmatic hernia without obstruction or gangrene: Secondary | ICD-10-CM

## 2022-04-02 DIAGNOSIS — K3189 Other diseases of stomach and duodenum: Secondary | ICD-10-CM

## 2022-04-02 DIAGNOSIS — J449 Chronic obstructive pulmonary disease, unspecified: Secondary | ICD-10-CM

## 2022-04-02 DIAGNOSIS — R778 Other specified abnormalities of plasma proteins: Secondary | ICD-10-CM | POA: Diagnosis not present

## 2022-04-02 HISTORY — PX: ESOPHAGOGASTRODUODENOSCOPY (EGD) WITH PROPOFOL: SHX5813

## 2022-04-02 HISTORY — PX: FINE NEEDLE ASPIRATION: SHX5430

## 2022-04-02 HISTORY — PX: BIOPSY: SHX5522

## 2022-04-02 HISTORY — PX: UPPER ESOPHAGEAL ENDOSCOPIC ULTRASOUND (EUS): SHX6562

## 2022-04-02 LAB — RENAL FUNCTION PANEL
Albumin: 2.7 g/dL — ABNORMAL LOW (ref 3.5–5.0)
Anion gap: 23 — ABNORMAL HIGH (ref 5–15)
BUN: 63 mg/dL — ABNORMAL HIGH (ref 8–23)
CO2: 19 mmol/L — ABNORMAL LOW (ref 22–32)
Calcium: 8.7 mg/dL — ABNORMAL LOW (ref 8.9–10.3)
Chloride: 90 mmol/L — ABNORMAL LOW (ref 98–111)
Creatinine, Ser: 10 mg/dL — ABNORMAL HIGH (ref 0.61–1.24)
GFR, Estimated: 5 mL/min — ABNORMAL LOW (ref >60)
Glucose, Bld: 78 mg/dL (ref 70–99)
Phosphorus: 6.9 mg/dL — ABNORMAL HIGH (ref 2.5–4.6)
Potassium: 5.1 mmol/L (ref 3.5–5.1)
Sodium: 132 mmol/L — ABNORMAL LOW (ref 135–145)

## 2022-04-02 LAB — CBC
HCT: 18.2 % — ABNORMAL LOW (ref 39.0–52.0)
Hemoglobin: 6.8 g/dL — CL (ref 13.0–17.0)
MCH: 39.5 pg — ABNORMAL HIGH (ref 26.0–34.0)
MCHC: 37.4 g/dL — ABNORMAL HIGH (ref 30.0–36.0)
MCV: 105.8 fL — ABNORMAL HIGH (ref 80.0–100.0)
Platelets: 166 10*3/uL (ref 150–400)
RBC: 1.72 MIL/uL — ABNORMAL LOW (ref 4.22–5.81)
RDW: 20.6 % — ABNORMAL HIGH (ref 11.5–15.5)
WBC: 8.4 10*3/uL (ref 4.0–10.5)
nRBC: 31 % — ABNORMAL HIGH (ref 0.0–0.2)

## 2022-04-02 LAB — ECHOCARDIOGRAM COMPLETE
Area-P 1/2: 3.5 cm2
Calc EF: 32.4 %
Height: 71 in
S' Lateral: 5 cm
Single Plane A2C EF: 40 %
Single Plane A4C EF: 30.5 %
Weight: 2303.37 oz

## 2022-04-02 LAB — HEPATITIS B SURFACE ANTIGEN: Hepatitis B Surface Ag: NONREACTIVE

## 2022-04-02 LAB — HEPATITIS C ANTIBODY: HCV Ab: NONREACTIVE

## 2022-04-02 LAB — HEPATITIS B SURFACE ANTIBODY,QUALITATIVE: Hep B S Ab: REACTIVE — AB

## 2022-04-02 LAB — HEPATITIS B CORE ANTIBODY, TOTAL: Hep B Core Total Ab: NONREACTIVE

## 2022-04-02 LAB — OCCULT BLOOD X 1 CARD TO LAB, STOOL: Fecal Occult Bld: POSITIVE — AB

## 2022-04-02 SURGERY — UPPER ESOPHAGEAL ENDOSCOPIC ULTRASOUND (EUS)
Anesthesia: Monitor Anesthesia Care

## 2022-04-02 MED ORDER — PROPOFOL 500 MG/50ML IV EMUL
INTRAVENOUS | Status: DC | PRN
  Administered 2022-04-02: 100 ug/kg/min via INTRAVENOUS

## 2022-04-02 MED ORDER — CIPROFLOXACIN HCL 250 MG PO TABS
250.0000 mg | ORAL_TABLET | Freq: Every day | ORAL | Status: AC
  Administered 2022-04-02 – 2022-04-04 (×3): 250 mg via ORAL
  Filled 2022-04-02 (×3): qty 1

## 2022-04-02 MED ORDER — PANTOPRAZOLE SODIUM 40 MG PO TBEC
40.0000 mg | DELAYED_RELEASE_TABLET | Freq: Two times a day (BID) | ORAL | Status: DC
  Administered 2022-04-02 – 2022-04-08 (×13): 40 mg via ORAL
  Filled 2022-04-02 (×13): qty 1

## 2022-04-02 MED ORDER — CHLORHEXIDINE GLUCONATE CLOTH 2 % EX PADS: 6.0000 | MEDICATED_PAD | Freq: Every day | CUTANEOUS | Status: DC

## 2022-04-02 MED ORDER — PHENYLEPHRINE 80 MCG/ML (10ML) SYRINGE FOR IV PUSH (FOR BLOOD PRESSURE SUPPORT)
PREFILLED_SYRINGE | INTRAVENOUS | Status: DC | PRN
  Administered 2022-04-02 (×2): 80 ug via INTRAVENOUS

## 2022-04-02 MED ORDER — LIDOCAINE 2% (20 MG/ML) 5 ML SYRINGE
INTRAMUSCULAR | Status: DC | PRN
  Administered 2022-04-02: 60 mg via INTRAVENOUS

## 2022-04-02 MED ORDER — CIPROFLOXACIN IN D5W 400 MG/200ML IV SOLN
INTRAVENOUS | Status: DC | PRN
  Administered 2022-04-02: 400 mg via INTRAVENOUS

## 2022-04-02 MED ORDER — CIPROFLOXACIN IN D5W 400 MG/200ML IV SOLN
INTRAVENOUS | Status: AC
  Filled 2022-04-02: qty 200

## 2022-04-02 MED ORDER — PROPOFOL 10 MG/ML IV BOLUS
INTRAVENOUS | Status: DC | PRN
  Administered 2022-04-02: 50 mg via INTRAVENOUS

## 2022-04-02 NOTE — Interval H&P Note (Signed)
History and Physical Interval Note:  04/02/2022 3:37 PM  Patrick [name]  has presented today for surgery, with the diagnosis of abdominal cyst.  The various methods of treatment have been discussed with the patient and family. After consideration of risks, benefits and other options for treatment, the patient has consented to  Procedure(s): UPPER ESOPHAGEAL ENDOSCOPIC ULTRASOUND (EUS) (N/A) as a surgical intervention.  The patient's history has been reviewed, patient examined, no change in status, stable for surgery.  I have reviewed the patient's chart and labs.  Questions were answered to the patient's satisfaction.     The risks of an EUS including intestinal perforation, bleeding, infection, aspiration, and medication effects were discussed as was the possibility it may not give a definitive diagnosis if a biopsy is performed.  When a biopsy of the pancreas is done as part of the EUS, there is an additional risk of pancreatitis at the rate of about 1-2%.  It was explained that procedure related pancreatitis is typically mild, although it can be severe and even life threatening, which is why we do not perform random pancreatic biopsies and only biopsy a lesion/area we feel is concerning enough to warrant the risk.    [employer]

## 2022-04-02 NOTE — Progress Notes (Signed)
Attempting to schedule EUS to further evaluate the large abdominal cyst this afternoon if staff available. NPO except meds, ice chips for now.

## 2022-04-02 NOTE — Progress Notes (Signed)
[facility] Progress Note   Subjective:   Patient seen and examined at bedside.  Reports ongoing pain in back and legs.  Denies CP, SOB, edema, orthopnea, abdominal pain and diarrhea.  States n/v improved today.  Objective Vitals:   04/02/22 0237 04/02/22 0500 04/02/22 0536 04/02/22 0915  BP: 93/72  121/75 93/69  Pulse: 94  (!) 101 83  Resp: 16  18 17   Temp: 98 F (36.7 C)  97.8 F (36.6 C) 98.1 F (36.7 C)  TempSrc: Oral  Oral   SpO2: 96%  98% 99%  Weight:  65.3 kg    Height:       Physical Exam General:chronically ill appearing male in NAD Heart:RRR, no mrg Lungs:CTAB,nml WOB on RA Abdomen:soft, NTND Extremities:no LE edema Dialysis Access: RU AVF +b/t   Filed Weights   04/01/22 1242 04/02/22 0500  Weight: 63.7 kg 65.3 kg    Intake/Output Summary (Last 24 hours) at 04/02/2022 1355 Last data filed at 04/02/2022 1000 Gross per 24 hour  Intake 538 ml  Output --  Net 538 ml    Additional Objective Labs: Basic Metabolic Panel: Recent Labs  Lab 03/31/22 1427 03/31/22 1731 04/01/22 2156 04/02/22 0347  NA 132* 129*  --  132*  K 3.5 3.7  --  5.1  CL 89*  --   --  90*  CO2 19*  --   --  19*  GLUCOSE 84  --   --  78  BUN 52*  --   --  63*  CREATININE 7.35*  --   --  10.00*  CALCIUM 9.1  --   --  8.7*  PHOS  --   --  6.4* 6.9*   Liver Function Tests: Recent Labs  Lab 03/31/22 1427 04/02/22 0347  AST 79*  --   ALT 28  --   ALKPHOS 487*  --   BILITOT 2.2*  --   PROT 8.1  --   ALBUMIN 3.8 2.7*   Recent Labs  Lab 03/31/22 1427  LIPASE 93*   CBC: Recent Labs  Lab 03/31/22 1427 03/31/22 1731 04/01/22 1158 04/02/22 0347  WBC 7.5  --  7.8 8.4  NEUTROABS 6.2  --  5.9  --   HGB 6.3* 7.8* 6.1* 6.8*  HCT 16.9* 23.0* 16.4* 18.2*  MCV 111.2*  --  113.1* 105.8*  PLT 191  --  174 166    Studies/Results: ECHOCARDIOGRAM COMPLETE  Result Date: 04/02/2022    ECHOCARDIOGRAM REPORT   Patient Name:   Patrick [name] Date of Exam:  04/02/2022 Medical Rec #:  [id number]           Height:       71.0 in Accession #:    [id number]          Weight:       144.0 lb Date of Birth:  01-Mar-1956           BSA:          1.834 m Patient Age:    66 years            BP:           93/69 mmHg Patient Gender: M                   HR:           96 bpm. Exam Location:  Inpatient Procedure: 2D Echo, Color Doppler and Cardiac Doppler Indications:    elevated trops  History:        Patient has prior history of Echocardiogram examinations, most                 recent 05/23/2021. CHF, Arrythmias:Atrial Fibrillation,                 Signs/Symptoms:Chest Pain; Risk Factors:Hypertension.  Sonographer:    Melissa Morford RDCS (AE, PE) Referring Phys: [id number] RONDELL A SMITH IMPRESSIONS  1. Left ventricular ejection fraction, by estimation, is 40 to 45%. The left ventricle has mildly decreased function. The left ventricle demonstrates global hypokinesis. The left ventricular internal cavity size was mildly to moderately dilated. Left ventricular diastolic parameters are indeterminate.  2. Right ventricular systolic function is mildly reduced. The right ventricular size is moderately-to-severely enlarged. There is moderately elevated pulmonary artery systolic pressure. The estimated right ventricular systolic pressure is 56.3 mmHg.  3. Left atrial size was severely dilated.  4. Right atrial size was severely dilated.  5. The mitral valve is abnormal. There is at least moderate, mainly posteriorly directed (although there are multiple MR jets) functional/secondary mitral regurgitation likely due to LA and LV dilation.  6. Tricuspid valve regurgitation is severe.  7. The aortic valve is tricuspid. There is mild calcification of the aortic valve. There is mild thickening of the aortic valve. Aortic valve regurgitation is not visualized. Aortic valve sclerosis/calcification is present, without any evidence of aortic stenosis.  8. The inferior vena cava is dilated in size with <50%  respiratory variability, suggesting right atrial pressure of 15 mmHg. Comparison(s): Compared to prior TTE on 05/2021, LVEF remains stable at 40-45%, MR appears slightly less on current study, there continues to be severe TR. FINDINGS  Left Ventricle: Left ventricular ejection fraction, by estimation, is 40 to 45%. The left ventricle has mildly decreased function. The left ventricle demonstrates global hypokinesis. The left ventricular internal cavity size was mildly to moderately dilated. There is no left ventricular hypertrophy. Left ventricular diastolic parameters are indeterminate. Right Ventricle: The right ventricular size is moderately-to-severely enlarged. No increase in right ventricular wall thickness. Right ventricular systolic function is mildly reduced. There is moderately elevated pulmonary artery systolic pressure. The tricuspid regurgitant velocity is 2.87 m/s, and with an assumed right atrial pressure of 15 mmHg, the estimated right ventricular systolic pressure is 14.9 mmHg. Left Atrium: Left atrial size was severely dilated. Right Atrium: Right atrial size was severely dilated. Pericardium: Trivial pericardial effusion is present. The pericardial effusion is circumferential. Mitral Valve: The mitral valve is abnormal. There is mild thickening of the mitral valve leaflet(s). There is at least moderate mainly posteriorly directed (although there are multiple MR jets) functional/secondary mitral regurgitation likely due to LA and LV dilation. mitral valve regurgitation. Tricuspid Valve: The tricuspid valve is normal in structure. Tricuspid valve regurgitation is severe. Aortic Valve: The aortic valve is tricuspid. There is mild calcification of the aortic valve. There is mild thickening of the aortic valve. Aortic valve regurgitation is not visualized. Aortic valve sclerosis/calcification is present, without any evidence of aortic stenosis. Pulmonic Valve: The pulmonic valve was normal in structure.  Pulmonic valve regurgitation is trivial. Aorta: The aortic root and ascending aorta are structurally normal, with no evidence of dilitation. Venous: The inferior vena cava is dilated in size with less than 50% respiratory variability, suggesting right atrial pressure of 15 mmHg. IAS/Shunts: The atrial septum is grossly normal.  LEFT VENTRICLE PLAX 2D LVIDd:         6.50 cm  Diastology LVIDs:         5.00 cm      LV e' medial:    10.00 cm/s LV PW:         1.10 cm      LV E/e' medial:  10.3 LV IVS:        1.00 cm      LV e' lateral:   14.10 cm/s LVOT diam:     2.20 cm      LV E/e' lateral: 7.3 LV SV:         59 LV SV Index:   32 LVOT Area:     3.80 cm  LV Volumes (MOD) LV vol d, MOD A2C: 138.0 ml LV vol d, MOD A4C: 103.0 ml LV vol s, MOD A2C: 82.8 ml LV vol s, MOD A4C: 71.6 ml LV SV MOD A2C:     55.2 ml LV SV MOD A4C:     103.0 ml LV SV MOD BP:      39.3 ml RIGHT VENTRICLE RV S prime:     11.60 cm/s TAPSE (M-mode): 1.9 cm LEFT ATRIUM              Index        RIGHT ATRIUM           Index LA diam:        6.50 cm  3.54 cm/m   RA Area:     31.40 cm LA Vol (A2C):   124.0 ml 67.63 ml/m  RA Volume:   112.00 ml 61.08 ml/m LA Vol (A4C):   130.0 ml 70.90 ml/m LA Biplane Vol: 133.0 ml 72.54 ml/m  AORTIC VALVE LVOT Vmax:   86.70 cm/s LVOT Vmean:  58.500 cm/s LVOT VTI:    0.156 m  AORTA Ao Root diam: 3.50 cm Ao Asc diam:  3.40 cm MITRAL VALVE                TRICUSPID VALVE MV Area (PHT): 3.50 cm     TR Peak grad:   32.9 mmHg MV Decel Time: 217 msec     TR Vmax:        287.00 cm/s MV E velocity: 103.00 cm/s MV A velocity: 36.10 cm/s   SHUNTS MV E/A ratio:  2.85         Systemic VTI:  0.16 m                             Systemic Diam: 2.20 cm Gwyndolyn Kaufman MD Electronically signed by Gwyndolyn Kaufman MD Signature Date/Time: 04/02/2022/12:16:30 PM    Final    CT Abdomen Pelvis W Contrast  Result Date: 03/31/2022 CLINICAL DATA:  Cough, sickle cell crisis, abdominal pain. EXAM: CT ANGIOGRAPHY CHEST CT ABDOMEN AND  PELVIS WITH CONTRAST TECHNIQUE: Multidetector CT imaging of the chest was performed using the standard protocol during bolus administration of intravenous contrast. Multiplanar CT image reconstructions and MIPs were obtained to evaluate the vascular anatomy. Multidetector CT imaging of the abdomen and pelvis was performed using the standard protocol during bolus administration of intravenous contrast. RADIATION DOSE REDUCTION: This exam was performed according to the departmental dose-optimization program which includes automated exposure control, adjustment of the mA and/or kV according to patient size and/or use of iterative reconstruction technique. CONTRAST:  189mL OMNIPAQUE IOHEXOL 300 MG/ML  SOLN COMPARISON:  Chest radiograph dated 03/31/2022. CTA chest dated 08/29/2021. FINDINGS: CTA CHEST FINDINGS Cardiovascular: Satisfactory opacification of the bilateral  pulmonary arteries to the segmental level. No evidence of pulmonary embolism. Study is not tailored for evaluation of the thoracic aorta. No evidence of thoracic aortic aneurysm or dissection. Cardiomegaly.  No pericardial effusion. Mediastinum/Nodes: No suspicious mediastinal lymphadenopathy. Visualized thyroid is unremarkable. Lungs/Pleura: Evaluation of the lung parenchyma is constrained by respiratory motion. Within that constraint, there are no suspicious pulmonary nodules. Mild patchy bilateral lower lobe opacities with mild dependent left lower lobe opacity, likely atelectasis. No focal consolidation. No frank interstitial edema. Mild centrilobular emphysematous changes, upper lung predominant. Trace bilateral pleural effusions. No pneumothorax. Musculoskeletal: Vertebral body changes related to sickle cell disease. No focal osseous lesions. Review of the MIP images confirms the above findings. CT ABDOMEN and PELVIS FINDINGS Hepatobiliary: Liver is notable for changes of hepatic congestion. Status post cholecystectomy. No intrahepatic or  extrahepatic ductal dilatation. Pancreas: Mild parenchymal atrophy. 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 Adrenals/Urinary Tract: Adrenal glands are within normal limits. Bilateral renal cysts, measuring up to 13 mm in the left upper kidney (series 6/image 22), measuring tubal low-density, benign (Bosniak I). No follow-up is recommended. No hydronephrosis. Bladder is underdistended but unremarkable. Stomach/Bowel: Mass effect on the stomach by the dominant upper abdominal bilobed cyst (described below). Surgical clips in the stomach (series 6/image 45). No evidence of bowel obstruction. Normal appendix (series 6/image 49). Suture line in the right lower abdomen (series 6/image 58). No colonic wall thickening or inflammatory changes. Vascular/Lymphatic: No evidence of abdominal aortic aneurysm. Atherosclerotic calcifications of the abdominal aorta and branch vessels. No suspicious abdominopelvic lymphadenopathy. Reproductive: Prostate is unremarkable. Other: 15.3 x 25.6 x 19.0 cm bilobed cyst in the central upper abdomen (series 6/image 32), most associated with the liver and stomach on the current study, although statistically most commonly reflecting a pseudocyst, less likely a mesenteric cyst. This is new from the prior. Mass effect on adjacent organs. At the upper limits of normal for simple fluid density internally. No definite solid components or internal enhancement. Trace abdominopelvic ascites. Musculoskeletal: Vertebral body changes related to sickle cell disease. No focal osseous lesions. Review of the MIP images confirms the above findings. IMPRESSION: No evidence of pulmonary embolism. Cardiomegaly with trace bilateral pleural effusions. No frank interstitial edema. Mild bilateral lower lobe atelectasis. 25.6 cm bilobed cyst in the central upper abdomen, most commonly reflecting a pseudocyst, less likely a mesenteric cyst. This is new from the prior CT. Associated mass effect on  adjacent organs. Consider EUS for cyst aspiration/sampling via the stomach. Otherwise, no acute findings in the abdomen/pelvis. Aortic Atherosclerosis (ICD10-I70.0) and Emphysema (ICD10-J43.9). Electronically Signed   By: Julian Hy M.D.   On: 03/31/2022 17:30   CT Angio Chest PE W and/or Wo Contrast  Result Date: 03/31/2022 CLINICAL DATA:  Cough, sickle cell crisis, abdominal pain. EXAM: CT ANGIOGRAPHY CHEST CT ABDOMEN AND PELVIS WITH CONTRAST TECHNIQUE: Multidetector CT imaging of the chest was performed using the standard protocol during bolus administration of intravenous contrast. Multiplanar CT image reconstructions and MIPs were obtained to evaluate the vascular anatomy. Multidetector CT imaging of the abdomen and pelvis was performed using the standard protocol during bolus administration of intravenous contrast. RADIATION DOSE REDUCTION: This exam was performed according to the departmental dose-optimization program which includes automated exposure control, adjustment of the mA and/or kV according to patient size and/or use of iterative reconstruction technique. CONTRAST:  12mL OMNIPAQUE IOHEXOL 300 MG/ML  SOLN COMPARISON:  Chest radiograph dated 03/31/2022. CTA chest dated 08/29/2021. FINDINGS: CTA CHEST FINDINGS Cardiovascular: Satisfactory opacification  of the bilateral pulmonary arteries to the segmental level. No evidence of pulmonary embolism. Study is not tailored for evaluation of the thoracic aorta. No evidence of thoracic aortic aneurysm or dissection. Cardiomegaly.  No pericardial effusion. Mediastinum/Nodes: No suspicious mediastinal lymphadenopathy. Visualized thyroid is unremarkable. Lungs/Pleura: Evaluation of the lung parenchyma is constrained by respiratory motion. Within that constraint, there are no suspicious pulmonary nodules. Mild patchy bilateral lower lobe opacities with mild dependent left lower lobe opacity, likely atelectasis. No focal consolidation. No frank  interstitial edema. Mild centrilobular emphysematous changes, upper lung predominant. Trace bilateral pleural effusions. No pneumothorax. Musculoskeletal: Vertebral body changes related to sickle cell disease. No focal osseous lesions. Review of the MIP images confirms the above findings. CT ABDOMEN and PELVIS FINDINGS Hepatobiliary: Liver is notable for changes of hepatic congestion. Status post cholecystectomy. No intrahepatic or extrahepatic ductal dilatation. Pancreas: Mild parenchymal atrophy. 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 Adrenals/Urinary Tract: Adrenal glands are within normal limits. Bilateral renal cysts, measuring up to 13 mm in the left upper kidney (series 6/image 22), measuring tubal low-density, benign (Bosniak I). No follow-up is recommended. No hydronephrosis. Bladder is underdistended but unremarkable. Stomach/Bowel: Mass effect on the stomach by the dominant upper abdominal bilobed cyst (described below). Surgical clips in the stomach (series 6/image 45). No evidence of bowel obstruction. Normal appendix (series 6/image 49). Suture line in the right lower abdomen (series 6/image 58). No colonic wall thickening or inflammatory changes. Vascular/Lymphatic: No evidence of abdominal aortic aneurysm. Atherosclerotic calcifications of the abdominal aorta and branch vessels. No suspicious abdominopelvic lymphadenopathy. Reproductive: Prostate is unremarkable. Other: 15.3 x 25.6 x 19.0 cm bilobed cyst in the central upper abdomen (series 6/image 32), most associated with the liver and stomach on the current study, although statistically most commonly reflecting a pseudocyst, less likely a mesenteric cyst. This is new from the prior. Mass effect on adjacent organs. At the upper limits of normal for simple fluid density internally. No definite solid components or internal enhancement. Trace abdominopelvic ascites. Musculoskeletal: Vertebral body changes related to sickle cell  disease. No focal osseous lesions. Review of the MIP images confirms the above findings. IMPRESSION: No evidence of pulmonary embolism. Cardiomegaly with trace bilateral pleural effusions. No frank interstitial edema. Mild bilateral lower lobe atelectasis. 25.6 cm bilobed cyst in the central upper abdomen, most commonly reflecting a pseudocyst, less likely a mesenteric cyst. This is new from the prior CT. Associated mass effect on adjacent organs. Consider EUS for cyst aspiration/sampling via the stomach. Otherwise, no acute findings in the abdomen/pelvis. Aortic Atherosclerosis (ICD10-I70.0) and Emphysema (ICD10-J43.9). Electronically Signed   By: Julian Hy M.D.   On: 03/31/2022 17:30   DG Chest Port 1 View  Result Date: 03/31/2022 CLINICAL DATA:  Cough, sickle crisis EXAM: PORTABLE CHEST 1 VIEW COMPARISON:  12/21/2021 FINDINGS: Cardiomegaly. Both lungs are clear. The visualized skeletal structures are unremarkable. IMPRESSION: Cardiomegaly without acute abnormality of the lungs in AP portable projection. Electronically Signed   By: Delanna Ahmadi M.D.   On: 03/31/2022 15:50    Medications:  [START ON 04/03/2022] ferric gluconate (FERRLECIT) IVPB      [START ON 04/03/2022] cinacalcet  30 mg Oral Q T,Th,Sa-HD   [START ON 04/03/2022] darbepoetin (ARANESP) injection - DIALYSIS  200 mcg Intravenous Q Tue-HD   [START ON 04/03/2022] doxercalciferol  7 mcg Intravenous Q T,Th,Sa-HD   hydroxyurea  500 mg Oral Daily   pantoprazole  40 mg Oral BID   sodium chloride flush  3 mL  Intravenous Q12H   torsemide  20 mg Oral QID    Dialysis Orders: SW TTS 4h 56min  400/500   64kg  2/2.5 bath  P2  Hep none  RUA AVF - last HD 10/12, post wt 63.4kg (1.3 L removed) - last Hb 7.6 - mircera 225 q 2, last 10/3, due 10/17 - venofer 50 mg weekly - hectorol 7 ug iv tiw tts - sensipar 30 mg tiw po tts     Assessment/ Plan: Abd pain / N/ V - CT abd showed large 25 cm cyst-like structure which may be causing  his pain, N/V. N/V improved today per pt.  Plan for EUS per GI.   ESRD - on HD TTS.  Missed last HD.  No acute indication for HD today.  Plan for HD tomorrow per regular schedule.  Sickle cell painful crisis - per pmd Elevated troponin - Per cardiology not ACS.  Likely 2/2 ischemia d/t severe anemia.  Atrial fib - per pmd /cards BP/ volume - CXR and CT chest do not show signs of fluid overload. Does not appear volume overloaded on exam.  Per patient he suspects weight loss w/GI upset, will plan for minimal UF w/HD tomorrow.  BP in goal.  Anemia of chronic disease/sickle cell - Hgb 6.8 s/p 1 unit pRBC 10/15.  Transfusion threshold 6 per PMD. On aranesp 200 qTues, IV iron ordered.  Secondary hyperparathyroidism - Ca in goal.  Phos elevated.  Resume binders when diet advanced.  Elevated LFT"s - tbili 2.2, AST 79. GI consulting.  Nutrition - Currently NPO. Renal diet w/fluid restrictions when advanced. Alb 2.7 - add protein supplements when diet advanced.   Jen Mow, PA-C [facility] 04/02/2022,1:55 PM  LOS: 1 day

## 2022-04-02 NOTE — Progress Notes (Signed)
PROGRESS NOTE   Patrick [name]  XYI:[id number] DOB: [birth year] DOA: 03/31/2022 PCP: [name], MD  Brief Narrative:  66 year old legally blind black male known sickle cell thalassemia followed by sickle cell service Chronic pain syndrome with OUD, former smoker ESRD TTS-still on diuretics Permanent A-fib not on anticoagulation--H/O GI bleed from AVMs treated with APC 07/2021 Nonischemic cardiomyopathy History of lap chole cholecystectomy with small bowel resection for ischemic colitis 05/2021 --- placed back on Eliquis on discharge Readmitted 06/27/2021 with recurrent hemorrhagic gastritis found to have Candida-Eliquis finally discontinued SBO 08/22/2021 managed conservatively+ recurrent intra-abdominal collections with previous drain back 08/30/2021  note that had refills of meds from sickle cell clinic 10/10 and had no issues  Patient return to the ED with back leg pain new cough decreased appetite intermittent crampy abdominal pain unsure blood in stools last dialyzed 10/12  Found to have hemoglobin down from 7.8-6.1 BUNs/creatinine 52/7.3 AST ALT 79/28, alk phos 487 [alk phos 170 previously LFTs normal on last labs 01/04/2022) BNP 3312 high-sensitivity troponin 427-->378 CT abdomen pelvis no PE  CT however showed 25.6 cm bilobed cystic mass in abdomen and GI was consulted Cardiology was consulted for elevated troponin Patient was transfused 1 unit PRBC to keep hemoglobin greater than 6 and stool Hemoccult was obtained  Hospital-Problem based course  Abdominal pain? 2/2 infected cyst/abscess 25 cm-has a history of central upper abdominal collections 08/30/2021 after lap chole in 05/2021 Procalcitonin >150 indicative of possible infectious etiology--as no fever hold Abx until further characterization by GI--Is n.p.o. awaiting EUS/EGD Appreciate GI input Unclear if pseudocyst o? Pain control oxycodone 10 twice daily as needed, Dilaudid 1 mg every 2 as needed severe  pain  Elevated LFTs could be secondary to infectious etiology with presence of cyst versus not Repeat labs and defer to GI  Elevated troponin Severe mitral and tricuspid regurg Chronic systolic heart failure-NYHA class II-III with EF 40-45% 05/23/2021 Echocardiogram pending-cardiology opinion, troponin elevation is driven by acute anemia and they have signed off Not on GDMT at home presumably because of hypotension and renal issues  Permanent A-fib, CHADS2 score >4 Poor candidacy left atrial appendage occlusion not a candidate for anticoagulation Seems rate controlled without any meds at this time  Prior ischemic colitis, hemorrhagic gastritis as above Resume Protonix 1 tab twice daily  Sickle cell thalassemia anemia Sickle cell service aware of patient --we will manage on our service-they are available to assist if pain needs coming to question Auryxia to 10 twice daily on hold--received 1 U prbc--transfusion threshold ~ less than 6 Continue Hydrea 500 daily, Aranesp 200 q. Tuesday, to be given ferric gluconate Await Hemoccult stool Continue usual oxycodone 10 twice daily as needed pain, IV Dilaudid 1 mg every 2 as needed- In case surgical procedure versus other are needed I have held aspirin 325  ESRD TTS Mild hyperkalemia Is on torsemide defer to renal-- Phos is elevated   DVT prophylaxis: SCD Code Status: full Family Communication: non present Disposition:  Status is: Inpatient Remains inpatient appropriate because:   Awaiting work-up from GI  Not ready for d?c needs EGD and may need a procedure   Consultants:  GI Renal  Procedures:   Antimicrobials:     Subjective: Awake coherent Brown stool reported by RN No fever no chills Main c/o is LE and lower back discomfort No fever no chills  Objective: Vitals:   04/01/22 2121 04/02/22 0237 04/02/22 0500 04/02/22 0536  BP: 103/69 93/72  121/75  Pulse: 90 94  (!)  101  Resp: 16 16  18   Temp:  98 F (36.7 C)   97.8 F (36.6 C)  TempSrc:  Oral  Oral  SpO2: 98% 96%  98%  Weight:   65.3 kg   Height:        Intake/Output Summary (Last 24 hours) at 04/02/2022 0835 Last data filed at 04/02/2022 0819 Gross per 24 hour  Intake 628 ml  Output 0 ml  Net 628 ml   Filed Weights   04/01/22 1242 04/02/22 0500  Weight: 63.7 kg 65.3 kg    Examination:  Awake coherent in nad no focal deficit--ocan visualize some but acuity poor Cta b no added sound rales rhonchi wheeze Abd -significant mass like area in upper epigastrium--slight tender no guard Able to move limbs x 4 equally without deficit Neuro grossly moving 4 limbs equally  Data Reviewed: personally reviewed   CBC    Component Value Date/Time   WBC 8.4 04/02/2022 0347   RBC 1.72 (L) 04/02/2022 0347   HGB 6.8 (LL) 04/02/2022 0347   HGB 7.5 (L) 11/28/2021 1026   HCT 18.2 (L) 04/02/2022 0347   HCT 21.1 (L) 11/28/2021 1026   PLT 166 04/02/2022 0347   PLT 167 11/28/2021 1026   MCV 105.8 (H) 04/02/2022 0347   MCV 103 (H) 11/28/2021 1026   MCH 39.5 (H) 04/02/2022 0347   MCHC 37.4 (H) 04/02/2022 0347   RDW 20.6 (H) 04/02/2022 0347   RDW 22.5 (H) 11/28/2021 1026   LYMPHSABS 0.1 (L) 04/01/2022 1158   LYMPHSABS 0.4 (L) 11/28/2021 1026   MONOABS 1.2 (H) 04/01/2022 1158   EOSABS 0.1 04/01/2022 1158   EOSABS 0.1 11/28/2021 1026   BASOSABS 0.0 04/01/2022 1158   BASOSABS 0.0 11/28/2021 1026      Latest Ref Rng & Units 04/02/2022    3:47 AM 03/31/2022    5:31 PM 03/31/2022    2:27 PM  CMP  Glucose 70 - 99 mg/dL 78   84   BUN 8 - 23 mg/dL 63   52   Creatinine 0.61 - 1.24 mg/dL 10.00   7.35   Sodium 135 - 145 mmol/L 132  129  132   Potassium 3.5 - 5.1 mmol/L 5.1  3.7  3.5   Chloride 98 - 111 mmol/L 90   89   CO2 22 - 32 mmol/L 19   19   Calcium 8.9 - 10.3 mg/dL 8.7   9.1   Total Protein 6.5 - 8.1 g/dL   8.1   Total Bilirubin 0.3 - 1.2 mg/dL   2.2   Alkaline Phos 38 - 126 U/L   487   AST 15 - 41 U/L   79   ALT 0 - 44 U/L   28       Radiology Studies: CT Abdomen Pelvis W Contrast  Result Date: 03/31/2022 CLINICAL DATA:  Cough, sickle cell crisis, abdominal pain. EXAM: CT ANGIOGRAPHY CHEST CT ABDOMEN AND PELVIS WITH CONTRAST TECHNIQUE: Multidetector CT imaging of the chest was performed using the standard protocol during bolus administration of intravenous contrast. Multiplanar CT image reconstructions and MIPs were obtained to evaluate the vascular anatomy. Multidetector CT imaging of the abdomen and pelvis was performed using the standard protocol during bolus administration of intravenous contrast. RADIATION DOSE REDUCTION: This exam was performed according to the departmental dose-optimization program which includes automated exposure control, adjustment of the mA and/or kV according to patient size and/or use of iterative reconstruction technique. CONTRAST:  162m OMNIPAQUE IOHEXOL 300 MG/ML  SOLN COMPARISON:  Chest radiograph dated 03/31/2022. CTA chest dated 08/29/2021. FINDINGS: CTA CHEST FINDINGS Cardiovascular: Satisfactory opacification of the bilateral pulmonary arteries to the segmental level. No evidence of pulmonary embolism. Study is not tailored for evaluation of the thoracic aorta. No evidence of thoracic aortic aneurysm or dissection. Cardiomegaly.  No pericardial effusion. Mediastinum/Nodes: No suspicious mediastinal lymphadenopathy. Visualized thyroid is unremarkable. Lungs/Pleura: Evaluation of the lung parenchyma is constrained by respiratory motion. Within that constraint, there are no suspicious pulmonary nodules. Mild patchy bilateral lower lobe opacities with mild dependent left lower lobe opacity, likely atelectasis. No focal consolidation. No frank interstitial edema. Mild centrilobular emphysematous changes, upper lung predominant. Trace bilateral pleural effusions. No pneumothorax. Musculoskeletal: Vertebral body changes related to sickle cell disease. No focal osseous lesions. Review of the MIP images  confirms the above findings. CT ABDOMEN and PELVIS FINDINGS Hepatobiliary: Liver is notable for changes of hepatic congestion. Status post cholecystectomy. No intrahepatic or extrahepatic ductal dilatation. Pancreas: Mild parenchymal atrophy. 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 Adrenals/Urinary Tract: Adrenal glands are within normal limits. Bilateral renal cysts, measuring up to 13 mm in the left upper kidney (series 6/image 22), measuring tubal low-density, benign (Bosniak I). No follow-up is recommended. No hydronephrosis. Bladder is underdistended but unremarkable. Stomach/Bowel: Mass effect on the stomach by the dominant upper abdominal bilobed cyst (described below). Surgical clips in the stomach (series 6/image 45). No evidence of bowel obstruction. Normal appendix (series 6/image 49). Suture line in the right lower abdomen (series 6/image 58). No colonic wall thickening or inflammatory changes. Vascular/Lymphatic: No evidence of abdominal aortic aneurysm. Atherosclerotic calcifications of the abdominal aorta and branch vessels. No suspicious abdominopelvic lymphadenopathy. Reproductive: Prostate is unremarkable. Other: 15.3 x 25.6 x 19.0 cm bilobed cyst in the central upper abdomen (series 6/image 32), most associated with the liver and stomach on the current study, although statistically most commonly reflecting a pseudocyst, less likely a mesenteric cyst. This is new from the prior. Mass effect on adjacent organs. At the upper limits of normal for simple fluid density internally. No definite solid components or internal enhancement. Trace abdominopelvic ascites. Musculoskeletal: Vertebral body changes related to sickle cell disease. No focal osseous lesions. Review of the MIP images confirms the above findings. IMPRESSION: No evidence of pulmonary embolism. Cardiomegaly with trace bilateral pleural effusions. No frank interstitial edema. Mild bilateral lower lobe atelectasis. 25.6  cm bilobed cyst in the central upper abdomen, most commonly reflecting a pseudocyst, less likely a mesenteric cyst. This is new from the prior CT. Associated mass effect on adjacent organs. Consider EUS for cyst aspiration/sampling via the stomach. Otherwise, no acute findings in the abdomen/pelvis. Aortic Atherosclerosis (ICD10-I70.0) and Emphysema (ICD10-J43.9). Electronically Signed   By: Julian Hy M.D.   On: 03/31/2022 17:30   CT Angio Chest PE W and/or Wo Contrast  Result Date: 03/31/2022 CLINICAL DATA:  Cough, sickle cell crisis, abdominal pain. EXAM: CT ANGIOGRAPHY CHEST CT ABDOMEN AND PELVIS WITH CONTRAST TECHNIQUE: Multidetector CT imaging of the chest was performed using the standard protocol during bolus administration of intravenous contrast. Multiplanar CT image reconstructions and MIPs were obtained to evaluate the vascular anatomy. Multidetector CT imaging of the abdomen and pelvis was performed using the standard protocol during bolus administration of intravenous contrast. RADIATION DOSE REDUCTION: This exam was performed according to the departmental dose-optimization program which includes automated exposure control, adjustment of the mA and/or kV according to patient size and/or use of iterative reconstruction technique. CONTRAST:  164mL OMNIPAQUE IOHEXOL  300 MG/ML  SOLN COMPARISON:  Chest radiograph dated 03/31/2022. CTA chest dated 08/29/2021. FINDINGS: CTA CHEST FINDINGS Cardiovascular: Satisfactory opacification of the bilateral pulmonary arteries to the segmental level. No evidence of pulmonary embolism. Study is not tailored for evaluation of the thoracic aorta. No evidence of thoracic aortic aneurysm or dissection. Cardiomegaly.  No pericardial effusion. Mediastinum/Nodes: No suspicious mediastinal lymphadenopathy. Visualized thyroid is unremarkable. Lungs/Pleura: Evaluation of the lung parenchyma is constrained by respiratory motion. Within that constraint, there are no  suspicious pulmonary nodules. Mild patchy bilateral lower lobe opacities with mild dependent left lower lobe opacity, likely atelectasis. No focal consolidation. No frank interstitial edema. Mild centrilobular emphysematous changes, upper lung predominant. Trace bilateral pleural effusions. No pneumothorax. Musculoskeletal: Vertebral body changes related to sickle cell disease. No focal osseous lesions. Review of the MIP images confirms the above findings. CT ABDOMEN and PELVIS FINDINGS Hepatobiliary: Liver is notable for changes of hepatic congestion. Status post cholecystectomy. No intrahepatic or extrahepatic ductal dilatation. Pancreas: Mild parenchymal atrophy. 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 Adrenals/Urinary Tract: Adrenal glands are within normal limits. Bilateral renal cysts, measuring up to 13 mm in the left upper kidney (series 6/image 22), measuring tubal low-density, benign (Bosniak I). No follow-up is recommended. No hydronephrosis. Bladder is underdistended but unremarkable. Stomach/Bowel: Mass effect on the stomach by the dominant upper abdominal bilobed cyst (described below). Surgical clips in the stomach (series 6/image 45). No evidence of bowel obstruction. Normal appendix (series 6/image 49). Suture line in the right lower abdomen (series 6/image 58). No colonic wall thickening or inflammatory changes. Vascular/Lymphatic: No evidence of abdominal aortic aneurysm. Atherosclerotic calcifications of the abdominal aorta and branch vessels. No suspicious abdominopelvic lymphadenopathy. Reproductive: Prostate is unremarkable. Other: 15.3 x 25.6 x 19.0 cm bilobed cyst in the central upper abdomen (series 6/image 32), most associated with the liver and stomach on the current study, although statistically most commonly reflecting a pseudocyst, less likely a mesenteric cyst. This is new from the prior. Mass effect on adjacent organs. At the upper limits of normal for simple  fluid density internally. No definite solid components or internal enhancement. Trace abdominopelvic ascites. Musculoskeletal: Vertebral body changes related to sickle cell disease. No focal osseous lesions. Review of the MIP images confirms the above findings. IMPRESSION: No evidence of pulmonary embolism. Cardiomegaly with trace bilateral pleural effusions. No frank interstitial edema. Mild bilateral lower lobe atelectasis. 25.6 cm bilobed cyst in the central upper abdomen, most commonly reflecting a pseudocyst, less likely a mesenteric cyst. This is new from the prior CT. Associated mass effect on adjacent organs. Consider EUS for cyst aspiration/sampling via the stomach. Otherwise, no acute findings in the abdomen/pelvis. Aortic Atherosclerosis (ICD10-I70.0) and Emphysema (ICD10-J43.9). Electronically Signed   By: Julian Hy M.D.   On: 03/31/2022 17:30   DG Chest Port 1 View  Result Date: 03/31/2022 CLINICAL DATA:  Cough, sickle crisis EXAM: PORTABLE CHEST 1 VIEW COMPARISON:  12/21/2021 FINDINGS: Cardiomegaly. Both lungs are clear. The visualized skeletal structures are unremarkable. IMPRESSION: Cardiomegaly without acute abnormality of the lungs in AP portable projection. Electronically Signed   By: Delanna Ahmadi M.D.   On: 03/31/2022 15:50     Scheduled Meds:  aspirin  325 mg Oral Daily   [START ON 04/03/2022] cinacalcet  30 mg Oral Q T,Th,Sa-HD   [START ON 04/03/2022] darbepoetin (ARANESP) injection - DIALYSIS  200 mcg Intravenous Q Tue-HD   [START ON 04/03/2022] doxercalciferol  7 mcg Intravenous Q T,Th,Sa-HD   hydroxyurea  500  mg Oral Daily   sodium chloride flush  3 mL Intravenous Q12H   torsemide  20 mg Oral QID   Continuous Infusions:  [START ON 04/03/2022] ferric gluconate (FERRLECIT) IVPB       LOS: 1 day   Time spent: 54  Nita Sells, MD [facility] To contact the attending provider between 7A-7P or the covering provider during after hours 7P-7A, please  log into the web site [email or web address] and access using universal [place] password for that web site. If you do not have the password, please call the hospital operator.  04/02/2022, 8:35 AM

## 2022-04-02 NOTE — Op Note (Addendum)
[facility] Patient Name: Patrick [name] Procedure Date : 04/02/2022 MRN: [id number] Attending MD: Justice Britain , MD Date of Birth: 01/08/1956 CSN: [id number] Age: 66 Admit Type: Inpatient Procedure:                Upper EUS Indications:              Cyst seen on CT scan, Epigastric abdominal pain,                            Anorexia, Nausea, Weight loss Providers:                Justice Britain, MD, Dulcy Fanny, Darliss Cheney, Technician Referring MD:             Pricilla Riffle. Fuller Plan, MD, Inpatient medical service Medicines:                Monitored Anesthesia Care, Cipro 035 mg IV Complications:            No immediate complications. Estimated Blood Loss:     Estimated blood loss was minimal. Procedure:                Pre-Anesthesia Assessment:                           - Prior to the procedure, a History and Physical                            was performed, and patient medications and                            allergies were reviewed. The patient's tolerance of                            previous anesthesia was also reviewed. The risks                            and benefits of the procedure and the sedation                            options and risks were discussed with the patient.                            All questions were answered, and informed consent                            was obtained. Prior Anticoagulants: The patient has                            taken no previous anticoagulant or antiplatelet                            agents. ASA Grade Assessment: III - A patient with  severe systemic disease. After reviewing the risks                            and benefits, the patient was deemed in                            satisfactory condition to undergo the procedure.                           After obtaining informed consent, the endoscope was                            passed under direct  vision. Throughout the                            procedure, the patient's blood pressure, pulse, and                            oxygen saturations were monitored continuously. The                            [id number]-H190 ([id number]) Olympus endoscope was introduced                            through the mouth, and advanced to the second part                            of duodenum. The TFJ-Q190V ([id number]) Olympus                            duodenoscope was introduced through the mouth, and                            advanced to the area of papilla. The GF-[id number]                            ([id number]) Olympus linear ultrasound scope was                            introduced through the mouth, and advanced to the                            stomach for ultrasound examination. The upper EUS                            was accomplished without difficulty. The patient                            tolerated the procedure. Scope In: Scope Out: Findings:      ENDOSCOPIC FINDING: :      No gross lesions were noted in the entire esophagus.      The Z-line was irregular and was found 45 cm from the incisors.      A 1 cm hiatal hernia was present.  Extrinsic impression/compression on the stomach was found of the entire       examined stomach.      An endoclip was found in the gastric body.      Multiple dispersed small erosions with no bleeding and no stigmata of       recent bleeding were found in the entire examined stomach. Biopsies were       taken with a cold forceps for histology and Helicobacter pylori testing.      An extrinsic impression deformity was found in the visualized duodenum.      There is a possible sphincterotomy (previously reported ERCP though not       at [facility]) and no other gross lesions of the major papilla (difficult to       obtain a short position so only able to visualize in the long position).      ENDOSONOGRAPHIC FINDING: :      An anechoic lesion suggestive of a cyst was  identified in the abdominal       cavity/peripancreatic region. The lesion measured 180 mm by 140 mm in       maximal cross-sectional diameter (this was maximized out). There was a       single compartment thinly septated. The outer wall of the lesion was       thick. There was no associated mass. There was a mild amount of internal       debris within the fluid-filled cavity. Diagnostic needle aspiration for       fluid was performed. Color Doppler imaging was utilized prior to needle       puncture to confirm a lack of significant vascular structures within the       needle path. One pass was made with the patient expect 22 gauge needle       using a transgastric approach. A stylet was used. The amount of fluid       collected was 120 mL. The fluid was cloudy and bilious. Samples were       sent for cell count and differential, amylase, lipase, cytology,       bacterial cultures, Gram stain and CEA (further GNAS/KRAS testing based       on findings).      The celiac region was visualized. Impression:               EGD impression:                           - No gross lesions in esophagus. Z-line irregular,                            45 cm from the incisors.                           - 1 cm hiatal hernia.                           - Extrinsic compression in the entire stomach.                           - Previously placed endoclips in gastric body.                           -  Erosive gastropathy with no bleeding and no                            stigmata of recent bleeding. Biopsied.                           - Duodenal extrinsic deformity.                           -Appearance of potential prior sphincterotomy at                            the major papilla.                           EUS impression:                           - A cystic lesion was seen in the abdominal                            cavity/peripancreatic region. Cytology results are                            pending.  However, the endosonographic appearance is                            suspicious for what I initially thought could be a                            pancreatic pseudocyst. Fine needle aspiration for                            fluid performed and I have significant concern that                            this is a biloma. Recommendation:           - The patient will be observed post-procedure,                            until all discharge criteria are met.                           - Return patient to hospital ward for ongoing care.                           - Observe patient's clinical course.                           - Await cytology results, await path results and                            await tumor markers.                           -  Expect the potential role of ERCP for biliary                            leak evaluation/management in the setting of what                            appears to be a component of biloma. Further                            discussions as labs return.                           - Consideration of a biloma-cystgastrostomy may be                            had depending on overall clinical standing and ERCP                            findings (to be considered).                           - Ciprofloxacin 500 mg twice daily x3 days to                            decrease risk of postinfectious complications.                           - The findings and recommendations were discussed                            with the patient.                           - The findings and recommendations were discussed                            with the referring physician. Procedure Code(s):        --- Professional ---                           [phone], Esophagogastroduodenoscopy, flexible,                            transoral; with transendoscopic ultrasound-guided                            intramural or transmural fine needle                            aspiration/biopsy(s),  (includes endoscopic                            ultrasound examination limited to the esophagus,                            stomach or duodenum, and  adjacent structures) Diagnosis Code(s):        --- Professional ---                           K22.8, Other specified diseases of esophagus                           K44.9, Diaphragmatic hernia without obstruction or                            gangrene                           K31.89, Other diseases of stomach and duodenum                           K86.2, Cyst of pancreas                           R10.13, Epigastric pain                           R63.0, Anorexia                           R11.0, Nausea                           R63.4, Abnormal weight loss                           R93.3, Abnormal findings on diagnostic imaging of                            other parts of digestive tract CPT copyright 2019 [employer]. All rights reserved. The codes documented in this report are preliminary and upon coder review may  be revised to meet current compliance requirements. Justice Britain, MD 04/02/2022 5:12:21 PM Number of Addenda: 0

## 2022-04-02 NOTE — Anesthesia Preprocedure Evaluation (Addendum)
Anesthesia Evaluation  Patient identified by MRN, date of birth, ID band Patient awake    Reviewed: Allergy & Precautions, NPO status , Patient's Chart, lab work & pertinent test results  History of Anesthesia Complications Negative for: history of anesthetic complications  Airway Mallampati: II  TM Distance: >3 FB Neck ROM: Full    Dental  (+) Missing,    Pulmonary COPD, former smoker   Pulmonary exam normal        Cardiovascular hypertension, +CHF  Normal cardiovascular exam+ dysrhythmias Atrial Fibrillation   TTE 2023: EF 40-45%, global hypokinesis, mild to moderately LVE, mild RV dysfunction, moderate to severe RVE, moderately elevated PASP 47.9 mmHg, severe LAE/RAE, at least moderate, mainly posteriorly directed (although there are multiple MR jets) functional/secondary mitral regurgitation likely due to LA and LV dilation, severe TR   Neuro/Psych negative neurological ROS  negative psych ROS   GI/Hepatic Neg liver ROS, hiatal hernia,GERD  Medicated and Controlled,,  Endo/Other  negative endocrine ROS    Renal/GU ESRF and DialysisRenal disease (last HD last Thursday (missed Saturday))  negative genitourinary   Musculoskeletal negative musculoskeletal ROS (+)    Abdominal   Peds  Hematology  (+) Blood dyscrasia, Sickle cell anemia and anemia   Anesthesia Other Findings Day of surgery medications reviewed with patient.  Reproductive/Obstetrics negative OB ROS                           Anesthesia Physical Anesthesia Plan  ASA: 4  Anesthesia Plan: MAC   Post-op Pain Management: Minimal or no pain anticipated   Induction:   PONV Risk Score and Plan: 1 and Treatment may vary due to age or medical condition and Propofol infusion  Airway Management Planned: Natural Airway and Nasal Cannula  Additional Equipment: None  Intra-op Plan:   Post-operative Plan:   Informed Consent: I  have reviewed the patients History and Physical, chart, labs and discussed the procedure including the risks, benefits and alternatives for the proposed anesthesia with the patient or authorized representative who has indicated his/her understanding and acceptance.   Patient has DNR.  Discussed DNR with patient and Suspend DNR.     Plan Discussed with: CRNA  Anesthesia Plan Comments:       Anesthesia Quick Evaluation

## 2022-04-02 NOTE — Transfer of Care (Signed)
Immediate Anesthesia Transfer of Care Note  Patient: Patrick [name]  Procedure(s) Performed: UPPER ESOPHAGEAL ENDOSCOPIC ULTRASOUND (EUS) ESOPHAGOGASTRODUODENOSCOPY (EGD) WITH PROPOFOL BIOPSY FINE NEEDLE ASPIRATION (FNA) LINEAR  Patient Location: PACU  Anesthesia Type:General  Level of Consciousness: awake, alert , oriented and patient cooperative  Airway & Oxygen Therapy: Patient Spontanous Breathing and Patient connected to face mask oxygen  Post-op Assessment: Report given to RN, Post -op Vital signs reviewed and stable and Patient moving all extremities X 4  Post vital signs: Reviewed and stable  Last Vitals:  Vitals Value Taken Time  BP 118/66 04/02/22 1656  Temp    Pulse 95 04/02/22 1659  Resp 10 04/02/22 1659  SpO2 95 % 04/02/22 1659  Vitals shown include unvalidated device data.  Last Pain:  Vitals:   04/02/22 1656  TempSrc:   PainSc: 0-No pain      Patients Stated Pain Goal: 3 (50/93/26 7124)  Complications: No notable events documented.

## 2022-04-02 NOTE — Anesthesia Postprocedure Evaluation (Signed)
Anesthesia Post Note  Patient: Patrick [name]  Procedure(s) Performed: UPPER ESOPHAGEAL ENDOSCOPIC ULTRASOUND (EUS) ESOPHAGOGASTRODUODENOSCOPY (EGD) WITH PROPOFOL BIOPSY FINE NEEDLE ASPIRATION (FNA) LINEAR     Patient location during evaluation: PACU Anesthesia Type: MAC Level of consciousness: awake and alert Pain management: pain level controlled Vital Signs Assessment: post-procedure vital signs reviewed and stable Respiratory status: spontaneous breathing, nonlabored ventilation, respiratory function stable and patient connected to nasal cannula oxygen Cardiovascular status: stable and blood pressure returned to baseline Postop Assessment: no apparent nausea or vomiting Anesthetic complications: no   No notable events documented.  Last Vitals:  Vitals:   04/02/22 1656 04/02/22 1711  BP: 118/66 113/68  Pulse: 88 72  Resp: 18 20  Temp: 36.5 C   SpO2: 98% 97%    Last Pain:  Vitals:   04/02/22 1711  TempSrc:   PainSc: 0-No pain                 Santa Lighter

## 2022-04-03 DIAGNOSIS — D5700 Hb-SS disease with crisis, unspecified: Secondary | ICD-10-CM | POA: Diagnosis not present

## 2022-04-03 LAB — CBC WITH DIFFERENTIAL/PLATELET
Abs Immature Granulocytes: 0 10*3/uL (ref 0.00–0.07)
Basophils Absolute: 0 10*3/uL (ref 0.0–0.1)
Basophils Relative: 0 %
Eosinophils Absolute: 0.4 10*3/uL (ref 0.0–0.5)
Eosinophils Relative: 4 %
HCT: 16.2 % — ABNORMAL LOW (ref 39.0–52.0)
Hemoglobin: 6.1 g/dL — CL (ref 13.0–17.0)
Lymphocytes Relative: 3 %
Lymphs Abs: 0.3 10*3/uL — ABNORMAL LOW (ref 0.7–4.0)
MCH: 39.9 pg — ABNORMAL HIGH (ref 26.0–34.0)
MCHC: 37.7 g/dL — ABNORMAL HIGH (ref 30.0–36.0)
MCV: 105.9 fL — ABNORMAL HIGH (ref 80.0–100.0)
Monocytes Absolute: 0.6 10*3/uL (ref 0.1–1.0)
Monocytes Relative: 7 %
Neutro Abs: 7.8 10*3/uL — ABNORMAL HIGH (ref 1.7–7.7)
Neutrophils Relative %: 86 %
Platelets: 164 10*3/uL (ref 150–400)
RBC: 1.53 MIL/uL — ABNORMAL LOW (ref 4.22–5.81)
RDW: 20.5 % — ABNORMAL HIGH (ref 11.5–15.5)
WBC: 9.1 10*3/uL (ref 4.0–10.5)
nRBC: 22.2 % — ABNORMAL HIGH (ref 0.0–0.2)
nRBC: 46 /100 WBC — ABNORMAL HIGH

## 2022-04-03 LAB — RENAL FUNCTION PANEL
Albumin: 2.6 g/dL — ABNORMAL LOW (ref 3.5–5.0)
Anion gap: 20 — ABNORMAL HIGH (ref 5–15)
BUN: 80 mg/dL — ABNORMAL HIGH (ref 8–23)
CO2: 20 mmol/L — ABNORMAL LOW (ref 22–32)
Calcium: 8 mg/dL — ABNORMAL LOW (ref 8.9–10.3)
Chloride: 91 mmol/L — ABNORMAL LOW (ref 98–111)
Creatinine, Ser: 10.75 mg/dL — ABNORMAL HIGH (ref 0.61–1.24)
GFR, Estimated: 5 mL/min — ABNORMAL LOW (ref >60)
Glucose, Bld: 106 mg/dL — ABNORMAL HIGH (ref 70–99)
Phosphorus: 6.9 mg/dL — ABNORMAL HIGH (ref 2.5–4.6)
Potassium: 4.4 mmol/L (ref 3.5–5.1)
Sodium: 131 mmol/L — ABNORMAL LOW (ref 135–145)

## 2022-04-03 LAB — HEPATITIS B SURFACE ANTIGEN: Hepatitis B Surface Ag: NONREACTIVE

## 2022-04-03 LAB — PREPARE RBC (CROSSMATCH)

## 2022-04-03 LAB — HEPATITIS B SURFACE ANTIBODY, QUANTITATIVE: Hep B S AB Quant (Post): 173.3 m[IU]/mL (ref >9.9)

## 2022-04-03 MED ORDER — ALBUMIN HUMAN 25 % IV SOLN
12.5000 g | INTRAVENOUS | Status: AC | PRN
  Administered 2022-04-03 (×2): 12.5 g via INTRAVENOUS
  Filled 2022-04-03 (×2): qty 50

## 2022-04-03 MED ORDER — DIPHENHYDRAMINE HCL 25 MG PO CAPS: 25.0000 mg | ORAL_CAPSULE | Freq: Once | ORAL | Status: DC

## 2022-04-03 MED ORDER — SODIUM CHLORIDE 0.9% IV SOLUTION: Freq: Once | INTRAVENOUS | Status: DC

## 2022-04-03 MED ORDER — LIDOCAINE HCL (PF) 1 % IJ SOLN: 5.0000 mL | INTRAMUSCULAR | Status: DC | PRN

## 2022-04-03 MED ORDER — LIDOCAINE-PRILOCAINE 2.5-2.5 % EX CREA: 1.0000 | TOPICAL_CREAM | CUTANEOUS | Status: DC | PRN

## 2022-04-03 MED ORDER — ACETAMINOPHEN 325 MG PO TABS
650.0000 mg | ORAL_TABLET | Freq: Once | ORAL | Status: AC
  Administered 2022-04-05: 650 mg via ORAL

## 2022-04-03 NOTE — Progress Notes (Signed)
PROGRESS NOTE   Patrick [name]  XWR:[id number] DOB: [birth year] DOA: 03/31/2022 PCP: [name], MD  Brief Narrative:  66 year old legally blind black male known sickle cell thalassemia followed by sickle cell service Chronic pain syndrome with OUD, former smoker ESRD TTS-still on diuretics Permanent A-fib not on anticoagulation--H/O GI bleed from AVMs treated with APC 07/2021 Nonischemic cardiomyopathy History of lap chole cholecystectomy with small bowel resection for ischemic colitis 05/2021 --- placed back on Eliquis on discharge Readmitted 06/27/2021 with recurrent hemorrhagic gastritis found to have Candida-Eliquis discontinued completely SBO 08/22/2021 managed conservatively+ recurrent intra-abdominal collections with previous drain back 08/30/2021  note that had refills of meds from sickle cell clinic 10/10 and had no issues  Patient return to the ED 10/15 back leg pain new cough decreased appetite intermittent crampy abdominal pain  unsure blood in stools  hemoglobin 7.8--->6.1  BUNs/creatinine 52/7.3 AST ALT 79/28, alk phos 487 [alk phos 170 previously LFTs normal on last labs 01/04/2022) BNP 3312 high-sensitivity troponin 427-->378 CT abdomen pelvis no PE  Cardiology was consulted for elevated troponin  10/15 transfused 1 unit PRBC to keep hemoglobin greater than 6, Hemoccult positive CT however showed 25.6 cm bilobed cystic mass in abdomen and GI was consulted 10/16 EGD revealing extrinsic compression of entire stomach EUS-cystic lesion?  Pancreatic pseudocyst FNAC performed, tumor markers, cytology, path Recommendation for consideration of biloma cyst gastrostomy 10/16 echo EF 40-45% global hypokinesis, decreased right ventricular function 10/17 transfuse 1 more unit of packed red blood cells given no rise in hemoglobin and in fact dropped from 6.8-6.1  Hospital-Problem based course  Abdominal pain? 2/2 infected cyst/abscess 25 cm-has a history of central  upper abdominal collections 08/30/2021 after lap chole in 05/2021 Procalcitonin >150 indicative ? infxn GI started Cipro X 3 d ending 10/18 -- EGD as above-- needs Biloma-Cytgastrostomy and is n.p.o. today --Follow surgical path total bilirubin lipase body fluid cell count surgical pathology from 10/16 Pain control oxycodone 10 twice daily as needed, Dilaudid 1 mg every 2 as needed severe pain  Hemoccult positive Defer to GI does have chronic anemia from sickle cell disease-do not think is having overt dark blood per rectum will need to transfuse X1 more unit 10/17  Elevated LFTs could be secondary to infectious etiology with presence of cyst versus not Recheck those labs in the morning  Elevated troponin Severe mitral and tricuspid regurg Chronic systolic heart failure-NYHA class II-III with EF 40-45% 05/23/2021 Echocardiogram 10/16 grossly unchanged from prior-cardiology have signed off Not on GDMT at home presumably because of hypotension and renal issues  Permanent A-fib, CHADS2 score >4 Poor candidacy left atrial appendage occlusion not a candidate for anticoagulation Seems rate controlled without any meds at this time  Prior ischemic colitis, hemorrhagic gastritis as above Resume Protonix 1 tab twice daily  Sickle cell thalassemia anemia Sickle cell service aware of patient --we will manage on our service-they are available to assist if pain needs coming to question Auryxia to 10 twice daily on hold --received 1 U prbc--transfusing again as above Continue Hydrea 500 daily, Aranesp 200 q. Tuesday, to be given ferric gluconate Continue usual oxycodone 10 twice daily as needed pain, IV Dilaudid 1 mg every 2 as needed- Needed resume post all procedures aspirin 325  ESRD TTS Mild hyperkalemia resolved  Is on torsemide defer to renal-- Phos is elevated and will need to be adjusted once taking meals again reliably   DVT prophylaxis: SCD Code Status: full Family Communication:  non present Disposition:  Status is: Inpatient Remains inpatient appropriate because:   Awaiting work-up from GI  Not ready for d?c needs EGD and may need a procedure   Consultants:  GI Renal  Procedures:   Antimicrobials:     Subjective: Seen on HD unit continues to complain of cough No chest pain fever  Objective: Vitals:   04/03/22 0922 04/03/22 0930 04/03/22 1002 04/03/22 1031  BP: 103/63 97/64 103/60 97/67  Pulse: 97 91 93 89  Resp: _0 Temp:      TempSrc:      SpO2:  99% 99%   Weight:      Height:        Intake/Output Summary (Last 24 hours) at 04/03/2022 1042 Last data filed at 04/03/2022 0651 Gross per 24 hour  Intake 460 ml  Output 75 ml  Net 385 ml    Filed Weights   04/02/22 1501 04/03/22 0538 04/03/22 0743  Weight: 65.3 kg 65.6 kg 63.1 kg    Examination:  Awake coherent in nad no focal deficit--ocan visualize some but acuity poor Cta b no added sound rales rhonchi wheeze Abd -significant mass like area in upper epigastrium--slight tender no guard Able to move limbs x 4 equally without deficit Neuro grossly moving 4 limbs equally  Data Reviewed: personally reviewed   CBC    Component Value Date/Time   WBC 9.1 04/03/2022 0745   RBC 1.53 (L) 04/03/2022 0745   HGB 6.1 (LL) 04/03/2022 0745   HGB 7.5 (L) 11/28/2021 1026   HCT 16.2 (L) 04/03/2022 0745   HCT 21.1 (L) 11/28/2021 1026   PLT 164 04/03/2022 0745   PLT 167 11/28/2021 1026   MCV 105.9 (H) 04/03/2022 0745   MCV 103 (H) 11/28/2021 1026   MCH 39.9 (H) 04/03/2022 0745   MCHC 37.7 (H) 04/03/2022 0745   RDW 20.5 (H) 04/03/2022 0745   RDW 22.5 (H) 11/28/2021 1026   LYMPHSABS 0.3 (L) 04/03/2022 0745   LYMPHSABS 0.4 (L) 11/28/2021 1026   MONOABS 0.6 04/03/2022 0745   EOSABS 0.4 04/03/2022 0745   EOSABS 0.1 11/28/2021 1026   BASOSABS 0.0 04/03/2022 0745   BASOSABS 0.0 11/28/2021 1026      Latest Ref Rng & Units 04/03/2022    3:20 AM 04/02/2022    3:47 AM 03/31/2022     5:31 PM  CMP  Glucose 70 - 99 mg/dL 106  78    BUN 8 - 23 mg/dL 80  63    Creatinine 0.61 - 1.24 mg/dL 10.75  10.00    Sodium 135 - 145 mmol/L 131  132  129   Potassium 3.5 - 5.1 mmol/L 4.4  5.1  3.7   Chloride 98 - 111 mmol/L 91  90    CO2 22 - 32 mmol/L 20  19    Calcium 8.9 - 10.3 mg/dL 8.0  8.7       Radiology Studies: ECHOCARDIOGRAM COMPLETE  Result Date: 04/02/2022    ECHOCARDIOGRAM REPORT   Patient Name:   Patrick [name] Date of Exam: 04/02/2022 Medical Rec #:  [id number]           Height:       71.0 in Accession #:    [id number]          Weight:       144.0 lb Date of Birth:  [birth year]           BSA:          1.834 m Patient  Age:    47 years            BP:           93/69 mmHg Patient Gender: M                   HR:           96 bpm. Exam Location:  Inpatient Procedure: 2D Echo, Color Doppler and Cardiac Doppler Indications:    elevated trops  History:        Patient has prior history of Echocardiogram examinations, most                 recent 05/23/2021. CHF, Arrythmias:Atrial Fibrillation,                 Signs/Symptoms:Chest Pain; Risk Factors:Hypertension.  Sonographer:    Melissa Morford RDCS (AE, PE) Referring Phys: [phone] RONDELL A SMITH IMPRESSIONS  1. Left ventricular ejection fraction, by estimation, is 40 to 45%. The left ventricle has mildly decreased function. The left ventricle demonstrates global hypokinesis. The left ventricular internal cavity size was mildly to moderately dilated. Left ventricular diastolic parameters are indeterminate.  2. Right ventricular systolic function is mildly reduced. The right ventricular size is moderately-to-severely enlarged. There is moderately elevated pulmonary artery systolic pressure. The estimated right ventricular systolic pressure is 38.4 mmHg.  3. Left atrial size was severely dilated.  4. Right atrial size was severely dilated.  5. The mitral valve is abnormal. There is at least moderate, mainly posteriorly directed (although  there are multiple MR jets) functional/secondary mitral regurgitation likely due to LA and LV dilation.  6. Tricuspid valve regurgitation is severe.  7. The aortic valve is tricuspid. There is mild calcification of the aortic valve. There is mild thickening of the aortic valve. Aortic valve regurgitation is not visualized. Aortic valve sclerosis/calcification is present, without any evidence of aortic stenosis.  8. The inferior vena cava is dilated in size with <50% respiratory variability, suggesting right atrial pressure of 15 mmHg. Comparison(s): Compared to prior TTE on 05/2021, LVEF remains stable at 40-45%, MR appears slightly less on current study, there continues to be severe TR. FINDINGS  Left Ventricle: Left ventricular ejection fraction, by estimation, is 40 to 45%. The left ventricle has mildly decreased function. The left ventricle demonstrates global hypokinesis. The left ventricular internal cavity size was mildly to moderately dilated. There is no left ventricular hypertrophy. Left ventricular diastolic parameters are indeterminate. Right Ventricle: The right ventricular size is moderately-to-severely enlarged. No increase in right ventricular wall thickness. Right ventricular systolic function is mildly reduced. There is moderately elevated pulmonary artery systolic pressure. The tricuspid regurgitant velocity is 2.87 m/s, and with an assumed right atrial pressure of 15 mmHg, the estimated right ventricular systolic pressure is 66.5 mmHg. Left Atrium: Left atrial size was severely dilated. Right Atrium: Right atrial size was severely dilated. Pericardium: Trivial pericardial effusion is present. The pericardial effusion is circumferential. Mitral Valve: The mitral valve is abnormal. There is mild thickening of the mitral valve leaflet(s). There is at least moderate mainly posteriorly directed (although there are multiple MR jets) functional/secondary mitral regurgitation likely due to LA and LV  dilation. mitral valve regurgitation. Tricuspid Valve: The tricuspid valve is normal in structure. Tricuspid valve regurgitation is severe. Aortic Valve: The aortic valve is tricuspid. There is mild calcification of the aortic valve. There is mild thickening of the aortic valve. Aortic valve regurgitation is not visualized. Aortic valve sclerosis/calcification is present, without  any evidence of aortic stenosis. Pulmonic Valve: The pulmonic valve was normal in structure. Pulmonic valve regurgitation is trivial. Aorta: The aortic root and ascending aorta are structurally normal, with no evidence of dilitation. Venous: The inferior vena cava is dilated in size with less than 50% respiratory variability, suggesting right atrial pressure of 15 mmHg. IAS/Shunts: The atrial septum is grossly normal.  LEFT VENTRICLE PLAX 2D LVIDd:         6.50 cm      Diastology LVIDs:         5.00 cm      LV e' medial:    10.00 cm/s LV PW:         1.10 cm      LV E/e' medial:  10.3 LV IVS:        1.00 cm      LV e' lateral:   14.10 cm/s LVOT diam:     2.20 cm      LV E/e' lateral: 7.3 LV SV:         59 LV SV Index:   32 LVOT Area:     3.80 cm  LV Volumes (MOD) LV vol d, MOD A2C: 138.0 ml LV vol d, MOD A4C: 103.0 ml LV vol s, MOD A2C: 82.8 ml LV vol s, MOD A4C: 71.6 ml LV SV MOD A2C:     55.2 ml LV SV MOD A4C:     103.0 ml LV SV MOD BP:      39.3 ml RIGHT VENTRICLE RV S prime:     11.60 cm/s TAPSE (M-mode): 1.9 cm LEFT ATRIUM              Index        RIGHT ATRIUM           Index LA diam:        6.50 cm  3.54 cm/m   RA Area:     31.40 cm LA Vol (A2C):   124.0 ml 67.63 ml/m  RA Volume:   112.00 ml 61.08 ml/m LA Vol (A4C):   130.0 ml 70.90 ml/m LA Biplane Vol: 133.0 ml 72.54 ml/m  AORTIC VALVE LVOT Vmax:   86.70 cm/s LVOT Vmean:  58.500 cm/s LVOT VTI:    0.156 m  AORTA Ao Root diam: 3.50 cm Ao Asc diam:  3.40 cm MITRAL VALVE                TRICUSPID VALVE MV Area (PHT): 3.50 cm     TR Peak grad:   32.9 mmHg MV Decel Time: 217 msec      TR Vmax:        287.00 cm/s MV E velocity: 103.00 cm/s MV A velocity: 36.10 cm/s   SHUNTS MV E/A ratio:  2.85         Systemic VTI:  0.16 m                             Systemic Diam: 2.20 cm Gwyndolyn Kaufman MD Electronically signed by Gwyndolyn Kaufman MD Signature Date/Time: 04/02/2022/12:16:30 PM    Final      Scheduled Meds:  Chlorhexidine Gluconate Cloth  6 each Topical Q0600   cinacalcet  30 mg Oral Q T,Th,Sa-HD   ciprofloxacin  250 mg Oral Daily   darbepoetin (ARANESP) injection - DIALYSIS  200 mcg Intravenous Q Tue-HD   doxercalciferol  7 mcg Intravenous Q T,Th,Sa-HD   hydroxyurea  500 mg Oral  Daily   pantoprazole  40 mg Oral BID   sodium chloride flush  3 mL Intravenous Q12H   torsemide  20 mg Oral QID   Continuous Infusions:  ferric gluconate (FERRLECIT) IVPB 62.5 mg (04/03/22 0835)     LOS: 2 days   Time spent: Emanuel, MD [facility] To contact the attending provider between 7A-7P or the covering provider during after hours 7P-7A, please log into the web site [email or web address] and access using universal [place] password for that web site. If you do not have the password, please call the hospital operator.  04/03/2022, 10:42 AM

## 2022-04-03 NOTE — Progress Notes (Signed)
Dr. Verlon Au called about 1055 asking if we were going to run blood on this patient today and I relayed to the tech who answered the phone that I was not made aware of any orders to run blood at this time and he told her that he was entering orders. I called him back to make him aware that the patient had less than an hour left on tx and we wouldn't have time to run blood on him. He asked me to talk to Dr. Moshe Cipro to see if the patient could get it on the floor and she said yes. I then called the lab to ask why I wasn't called and made aware of the critical. She stated that the pt had a hgb of 6.8 yesterday which is a critical as well and their policy is that they don't call subsequent critical values. I told her that wasn't good b/c the pt isn't in the same area he was yesterday so we aren't aware of a critical yesterday and would have needed to be made aware of any critical value to which we sent a sample down for.Al Pimple, RN AD aware

## 2022-04-03 NOTE — Progress Notes (Addendum)
Daily Rounding Note  04/03/2022, 2:01 PM  LOS: 2 days   SUBJECTIVE:   Chief complaint:      Hgb declined.  Got PRBC today, 2nd thus far.  BM this AM not described as melenic or bloody.  Pt is blind and not aware of the color.   Persistent back pain.  Pai in mid abdomen persists Appetite remains depressed, no nausea.  Tolerating solid food  OBJECTIVE:         Vital signs in last 24 hours:    Temp:  [97.6 F (36.4 C)-98 F (36.7 C)] 98 F (36.7 C) (10/17 1255) Pulse Rate:  [71-156] 89 (10/17 1257) Resp:  [9-28] 17 (10/17 1257) BP: (86-123)/(55-84) 107/67 (10/17 1257) SpO2:  [88 %-100 %] 100 % (10/17 1257) Weight:  [62.5 kg-65.6 kg] 62.5 kg (10/17 1224) Last BM Date : 04/02/22 Filed Weights   04/03/22 0538 04/03/22 0743 04/03/22 1224  Weight: 65.6 kg 63.1 kg 62.5 kg   General: frail\.  Looks chr ill   Heart: RRR Chest: clear bil Abdomen: tense,  tender > on R.  Active BS Extremities: thin.  No edema Neuro/Psych:  alert, no confusion, fluid speech.  Blind.    Intake/Output from previous day: 10/16 0701 - 10/17 0700 In: 493 [P.O.:290; I.V.:3; IV Piggyback:200] Out: 75 [Urine:75]  Intake/Output this shift: Total I/O In: 365 [Blood:365] Out: 2100 [Other:2100]  Lab Results: Recent Labs    04/01/22 1158 04/02/22 0347 04/03/22 0745  WBC 7.8 8.4 9.1  HGB 6.1* 6.8* 6.1*  HCT 16.4* 18.2* 16.2*  PLT 174 166 164   BMET Recent Labs    03/31/22 1427 03/31/22 1731 04/02/22 0347 04/03/22 0320  NA 132* 129* 132* 131*  K 3.5 3.7 5.1 4.4  CL 89*  --  90* 91*  CO2 19*  --  19* 20*  GLUCOSE 84  --  78 106*  BUN 52*  --  63* 80*  CREATININE 7.35*  --  10.00* 10.75*  CALCIUM 9.1  --  8.7* 8.0*   LFT Recent Labs    03/31/22 1427 04/02/22 0347 04/03/22 0320  PROT 8.1  --   --   ALBUMIN 3.8 2.7* 2.6*  AST 79*  --   --   ALT 28  --   --   ALKPHOS 487*  --   --   BILITOT 2.2*  --   --     PT/INR Recent Labs    03/31/22 1427  LABPROT 17.2*  INR 1.4*   Hepatitis Panel Recent Labs    04/02/22 1746 04/03/22 0320  HEPBSAG NON REACTIVE NON REACTIVE  HCVAB NON REACTIVE  --     Studies/Results: ECHOCARDIOGRAM COMPLETE  Result Date: 04/02/2022    ECHOCARDIOGRAM REPORT   Patient Name:   Patrick [name] Date of Exam: 04/02/2022 Medical Rec #:  [id number]           Height:       71.0 in Accession #:    [id number]          Weight:       144.0 lb Date of Birth:  11-Sep-1955           BSA:          1.834 m Patient Age:    66 years            BP:           93/69 mmHg Patient Gender: M  HR:           96 bpm. Exam Location:  Inpatient Procedure: 2D Echo, Color Doppler and Cardiac Doppler Indications:    elevated trops  History:        Patient has prior history of Echocardiogram examinations, most                 recent 05/23/2021. CHF, Arrythmias:Atrial Fibrillation,                 Signs/Symptoms:Chest Pain; Risk Factors:Hypertension.  Sonographer:    Melissa Morford RDCS (AE, PE) Referring Phys: [phone] RONDELL A SMITH IMPRESSIONS  1. Left ventricular ejection fraction, by estimation, is 40 to 45%. The left ventricle has mildly decreased function. The left ventricle demonstrates global hypokinesis. The left ventricular internal cavity size was mildly to moderately dilated. Left ventricular diastolic parameters are indeterminate.  2. Right ventricular systolic function is mildly reduced. The right ventricular size is moderately-to-severely enlarged. There is moderately elevated pulmonary artery systolic pressure. The estimated right ventricular systolic pressure is 44.9 mmHg.  3. Left atrial size was severely dilated.  4. Right atrial size was severely dilated.  5. The mitral valve is abnormal. There is at least moderate, mainly posteriorly directed (although there are multiple MR jets) functional/secondary mitral regurgitation likely due to LA and LV dilation.  6. Tricuspid  valve regurgitation is severe.  7. The aortic valve is tricuspid. There is mild calcification of the aortic valve. There is mild thickening of the aortic valve. Aortic valve regurgitation is not visualized. Aortic valve sclerosis/calcification is present, without any evidence of aortic stenosis.  8. The inferior vena cava is dilated in size with <50% respiratory variability, suggesting right atrial pressure of 15 mmHg. Comparison(s): Compared to prior TTE on 05/2021, LVEF remains stable at 40-45%, MR appears slightly less on current study, there continues to be severe TR. FINDINGS  Left Ventricle: Left ventricular ejection fraction, by estimation, is 40 to 45%. The left ventricle has mildly decreased function. The left ventricle demonstrates global hypokinesis. The left ventricular internal cavity size was mildly to moderately dilated. There is no left ventricular hypertrophy. Left ventricular diastolic parameters are indeterminate. Right Ventricle: The right ventricular size is moderately-to-severely enlarged. No increase in right ventricular wall thickness. Right ventricular systolic function is mildly reduced. There is moderately elevated pulmonary artery systolic pressure. The tricuspid regurgitant velocity is 2.87 m/s, and with an assumed right atrial pressure of 15 mmHg, the estimated right ventricular systolic pressure is 20.1 mmHg. Left Atrium: Left atrial size was severely dilated. Right Atrium: Right atrial size was severely dilated. Pericardium: Trivial pericardial effusion is present. The pericardial effusion is circumferential. Mitral Valve: The mitral valve is abnormal. There is mild thickening of the mitral valve leaflet(s). There is at least moderate mainly posteriorly directed (although there are multiple MR jets) functional/secondary mitral regurgitation likely due to LA and LV dilation. mitral valve regurgitation. Tricuspid Valve: The tricuspid valve is normal in structure. Tricuspid valve  regurgitation is severe. Aortic Valve: The aortic valve is tricuspid. There is mild calcification of the aortic valve. There is mild thickening of the aortic valve. Aortic valve regurgitation is not visualized. Aortic valve sclerosis/calcification is present, without any evidence of aortic stenosis. Pulmonic Valve: The pulmonic valve was normal in structure. Pulmonic valve regurgitation is trivial. Aorta: The aortic root and ascending aorta are structurally normal, with no evidence of dilitation. Venous: The inferior vena cava is dilated in size with less than 50% respiratory variability, suggesting right  atrial pressure of 15 mmHg. IAS/Shunts: The atrial septum is grossly normal.  LEFT VENTRICLE PLAX 2D LVIDd:         6.50 cm      Diastology LVIDs:         5.00 cm      LV e' medial:    10.00 cm/s LV PW:         1.10 cm      LV E/e' medial:  10.3 LV IVS:        1.00 cm      LV e' lateral:   14.10 cm/s LVOT diam:     2.20 cm      LV E/e' lateral: 7.3 LV SV:         59 LV SV Index:   32 LVOT Area:     3.80 cm  LV Volumes (MOD) LV vol d, MOD A2C: 138.0 ml LV vol d, MOD A4C: 103.0 ml LV vol s, MOD A2C: 82.8 ml LV vol s, MOD A4C: 71.6 ml LV SV MOD A2C:     55.2 ml LV SV MOD A4C:     103.0 ml LV SV MOD BP:      39.3 ml RIGHT VENTRICLE RV S prime:     11.60 cm/s TAPSE (M-mode): 1.9 cm LEFT ATRIUM              Index        RIGHT ATRIUM           Index LA diam:        6.50 cm  3.54 cm/m   RA Area:     31.40 cm LA Vol (A2C):   124.0 ml 67.63 ml/m  RA Volume:   112.00 ml 61.08 ml/m LA Vol (A4C):   130.0 ml 70.90 ml/m LA Biplane Vol: 133.0 ml 72.54 ml/m  AORTIC VALVE LVOT Vmax:   86.70 cm/s LVOT Vmean:  58.500 cm/s LVOT VTI:    0.156 m  AORTA Ao Root diam: 3.50 cm Ao Asc diam:  3.40 cm MITRAL VALVE                TRICUSPID VALVE MV Area (PHT): 3.50 cm     TR Peak grad:   32.9 mmHg MV Decel Time: 217 msec     TR Vmax:        287.00 cm/s MV E velocity: 103.00 cm/s MV A velocity: 36.10 cm/s   SHUNTS MV E/A ratio:  2.85          Systemic VTI:  0.16 m                             Systemic Diam: 2.20 cm Gwyndolyn Kaufman MD Electronically signed by Gwyndolyn Kaufman MD Signature Date/Time: 04/02/2022/12:16:30 PM    Final     ASSESMENT:   Upper abdominal cyst w mass effect to adjacent organs.. 04/02/2022 EGD showing small hiatal hernia.  Irregular Z-line.  Extrinsic compression to the stomach.  Presence of previously placed endoclips in the gastric body.  Erosive gastropathy with out bleeding.  Extrinsic duodenal deformity.  Appearance suggesting prior sphincterotomy at major papilla.  Biopsies obtained of the stomach. 04/02/2022 EUS.  Cystic lesion seen in the abdominal cavity/peripancreatic region, FNA cytology obtained and pending.  Appearance suspicious for pancreatic pseudocyst versus biloma.  ESRD.  Elevated LFTs, T. bili fluctuating, alk Phos rising, AST rising, ALT consistently normal.  Choledocholithiasis hx ERCP  sphincterotomy, stone extraction,  pancreatic and CBD stent placement 05/2021.  Lap chole 05/2021. HCV negative, HBV immune.      Anemia. Hgb 7.8.. 6.1.. 6.8.. 6.1.   FOBT positive    PLAN   Fluid cytology, fluid lipase level, fluid cell count, fluid T. bili level are all in progress Pathology from locations in the stomach are also pending.   Plan ERCP w possible stent to assess/address for possible bile leak tmrw AM, Wednesday.    Azucena Freed  04/03/2022, 2:01 PM Phone [phone]    Attending Physician Note   I have taken an interval history, reviewed the chart and examined the patient. I performed a substantive portion of this encounter, including complete performance of at least one of the key components, in conjunction with the APP. I agree with the APP's note, impression and recommendations with my edits. My additional impressions and recommendations are as follows.   Suspected bile leak with a large biloma. We contacted the lab as the fluid from the EUS cyst aspiration has not been  resulted. The specimen has not been sent for analysis however it is not clear what happened. ERCP with biliary stent placement is planned for tomorrow am. Will discuss with Dr. Rush Landmark tomorrow regarding options for cyst drainage after ERCP.    Lucio Edward, MD [facility] See AMION, [facility], for our on call provider

## 2022-04-03 NOTE — TOC Initial Note (Signed)
Transition of Care [facility]) - Initial/Assessment Note    Patient Details  Name: Patrick [name] MRN: [id number] Date of Birth: [birth year]  Transition of Care [facility]) CM/SW Contact:    Ninfa Meeker, RN Phone Number: 04/03/2022, 3:31 PM  Clinical Narrative:                 Transition of Care Screening Note: Adm with Sickle Cell crisis  Transition of Care Department (TOC) has reviewed patient and no TOC needs have been identified at this time. We will continue to monitor patient advancement through Interdisciplinary progressions. If new patient transition needs arise, please place a consult.          Patient Goals and CMS Choice        Expected Discharge Plan and Services                                                Prior Living Arrangements/Services                       Activities of Daily Living Home Assistive Devices/Equipment: None ADL Screening (condition at time of admission) Patient's cognitive ability adequate to safely complete daily activities?: Yes Is the patient deaf or have difficulty hearing?: No Does the patient have difficulty seeing, even when wearing glasses/contacts?: Yes Does the patient have difficulty concentrating, remembering, or making decisions?: No Patient able to express need for assistance with ADLs?: Yes Does the patient have difficulty dressing or bathing?: No Independently performs ADLs?: Yes (appropriate for developmental age) Does the patient have difficulty walking or climbing stairs?: Yes Weakness of Legs: None Weakness of Arms/Hands: None  Permission Sought/Granted                  Emotional Assessment              Admission diagnosis:  End stage renal disease ([place]) [N18.6] Sickle cell crisis ([place]) [D57.00] Elevated troponin [R79.89] High anion gap metabolic acidosis [W43.15] Sickle cell pain crisis ([place]) [D57.00] Patient Active Problem List   Diagnosis Date Noted   Elevated troponin  04/01/2022   Upper abdominal pain 40/01/6760   Metabolic acidosis with increased anion gap and accumulation of organic acids 04/01/2022   Elevated LFTs 04/01/2022   Early satiety    Sickle cell crisis ([place]) 03/31/2022   Iron deficiency anemia 11/30/2021   Congestive heart failure ([place]) 11/30/2021   Renal failure 11/30/2021   Cholelithiasis 11/30/2021   Sickle cell anemia with crisis ([place]) 11/23/2021   Sickle cell pain crisis ([place]) 11/22/2021   Hospital discharge follow-up 09/28/2021   Chronic pain syndrome    Acute on chronic anemia 09/13/2021   Persistent atrial fibrillation ([place]) 09/13/2021   Hypothermia 08/22/2021   Abdominal fluid collection 08/22/2021   Gastric AVM    Hiatal hernia    Pressure injury of skin 08/01/2021   Hyponatremia 07/31/2021   Hypomagnesemia 07/31/2021   Upper GI bleeding 06/27/2021   H/O major abdominal surgery 06/27/2021   Visual impairment 06/27/2021   COPD not affecting current episode of care 06/27/2021   Empyema of gallbladder 05/27/2021   Acute gangrenous cholecystitis s/p open cholcystectomy 05/24/2021 05/27/2021   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 05/24/2021 05/27/2021   Acute blood loss anemia    Protein-calorie malnutrition, severe 05/23/2021   SBO (small bowel  obstruction) ([place]) 05/19/2021   Acute upper GI bleed 08/02/2020   Prolonged QT interval 08/02/2020   Upper GI bleed 08/02/2020   Hematemesis with nausea    Chronic anticoagulation    Tobacco dependence 05/28/2020   Muscle spasm of back 81/06/7508   Chronic systolic CHF (congestive heart failure) ([place]) 09/11/2019   Hypoxia 06/02/2018   Symptomatic anemia    Chronic heart failure with preserved ejection fraction ([place])    Colon cancer screening 07/16/2017   Encounter for therapeutic drug monitoring 12/29/2015   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   Permanent atrial fibrillation ([place]) 12/24/2015   Cough with sputum 10/25/2015   Muscle tightness-Right arm  05/27/2014    Arm pain, right 05/06/2014   ESRD on HD TTS 05/06/2014   Chest pain 01/13/2014   Vitamin D deficiency 09/25/2013   CKD (chronic kidney disease), stage IV (HCC) 09/25/2013   Elevated uric acid in blood 09/25/2013   Hb-SS disease without crisis ([place]) 09/25/2013   Anemia 09/25/2013   Onychomycosis of toenail 08/27/2013   Hyperglycemia 08/27/2013   CHF (congestive heart failure) ([place]) 08/19/2013   Nausea & vomiting 02/07/2013   Sickle cell anemia ([place]) 11/21/2012   NSVT (nonsustained ventricular tachycardia) ([place]) 10/21/2011   Gout 10/19/2011   Sickle-cell thalassemia with crisis ([place]) 10/17/2011   Chronic combined systolic and diastolic CHF (congestive heart failure) ([place]) 10/17/2011   Essential hypertension 06/07/2011   PCP:  [name], MD Pharmacy:   [id number] [address], [place] [address] Phone: [phone] Fax: [phone]     Social Determinants of Health (SDOH) Interventions    Readmission Risk Interventions    08/05/2020   11:54 AM  Readmission Risk Prevention Plan  Transportation Screening Complete  PCP or Specialist Appt within 3-5 Days Complete  HRI or [place] Complete  Social Work Consult for [place] Planning/Counseling Complete  Palliative Care Screening Not Applicable  Medication Review Press photographer) Complete

## 2022-04-03 NOTE — Progress Notes (Signed)
[facility] Progress Note   Subjective:   Patient seen and examined on HD.  Had endoscopic [place] yest- unclear results or plan as of yet-  he is having back pain in HD   Objective Vitals:   04/03/22 0900 04/03/22 0922 04/03/22 0930 04/03/22 1002  BP: (!) 86/68 103/63 97/64 103/60  Pulse: 91 97 91 93  Resp: (!) 27 13 20 14   Temp:      TempSrc:      SpO2: 100%  99% 99%  Weight:      Height:       Physical Exam General:chronically ill appearing male in NAD Heart:RRR, no mrg Lungs:CTAB,nml WOB on RA Abdomen:soft, NTND Extremities:no LE edema Dialysis Access: RU AVF +b/t   Filed Weights   04/02/22 1501 04/03/22 0538 04/03/22 0743  Weight: 65.3 kg 65.6 kg 63.1 kg    Intake/Output Summary (Last 24 hours) at 04/03/2022 1030 Last data filed at 04/03/2022 0651 Gross per 24 hour  Intake 460 ml  Output 75 ml  Net 385 ml    Additional Objective Labs: Basic Metabolic Panel: Recent Labs  Lab 03/31/22 1427 03/31/22 1731 04/01/22 2156 04/02/22 0347 04/03/22 0320  NA 132* 129*  --  132* 131*  K 3.5 3.7  --  5.1 4.4  CL 89*  --   --  90* 91*  CO2 19*  --   --  19* 20*  GLUCOSE 84  --   --  78 106*  BUN 52*  --   --  63* 80*  CREATININE 7.35*  --   --  10.00* 10.75*  CALCIUM 9.1  --   --  8.7* 8.0*  PHOS  --   --  6.4* 6.9* 6.9*   Liver Function Tests: Recent Labs  Lab 03/31/22 1427 04/02/22 0347 04/03/22 0320  AST 79*  --   --   ALT 28  --   --   ALKPHOS 487*  --   --   BILITOT 2.2*  --   --   PROT 8.1  --   --   ALBUMIN 3.8 2.7* 2.6*   Recent Labs  Lab 03/31/22 1427  LIPASE 93*   CBC: Recent Labs  Lab 03/31/22 1427 03/31/22 1731 04/01/22 1158 04/02/22 0347 04/03/22 0745  WBC 7.5  --  7.8 8.4 9.1  NEUTROABS 6.2  --  5.9  --  7.8*  HGB 6.3*   < > 6.1* 6.8* 6.1*  HCT 16.9*   < > 16.4* 18.2* 16.2*  MCV 111.2*  --  113.1* 105.8* 105.9*  PLT 191  --  174 166 164   < > = values in this interval not displayed.     Studies/Results: ECHOCARDIOGRAM COMPLETE  Result Date: 04/02/2022    ECHOCARDIOGRAM REPORT   Patient Name:   Patrick [name] Date of Exam: 04/02/2022 Medical Rec #:  [id number]           Height:       71.0 in Accession #:    [id number]          Weight:       144.0 lb Date of Birth:  1956-01-18           BSA:          1.834 m Patient Age:    66 years            BP:           93/69 mmHg Patient Gender: M  HR:           96 bpm. Exam Location:  Inpatient Procedure: 2D Echo, Color Doppler and Cardiac Doppler Indications:    elevated trops  History:        Patient has prior history of Echocardiogram examinations, most                 recent 05/23/2021. CHF, Arrythmias:Atrial Fibrillation,                 Signs/Symptoms:Chest Pain; Risk Factors:Hypertension.  Sonographer:    Melissa Morford RDCS (AE, PE) Referring Phys: [phone] RONDELL A SMITH IMPRESSIONS  1. Left ventricular ejection fraction, by estimation, is 40 to 45%. The left ventricle has mildly decreased function. The left ventricle demonstrates global hypokinesis. The left ventricular internal cavity size was mildly to moderately dilated. Left ventricular diastolic parameters are indeterminate.  2. Right ventricular systolic function is mildly reduced. The right ventricular size is moderately-to-severely enlarged. There is moderately elevated pulmonary artery systolic pressure. The estimated right ventricular systolic pressure is 62.9 mmHg.  3. Left atrial size was severely dilated.  4. Right atrial size was severely dilated.  5. The mitral valve is abnormal. There is at least moderate, mainly posteriorly directed (although there are multiple MR jets) functional/secondary mitral regurgitation likely due to LA and LV dilation.  6. Tricuspid valve regurgitation is severe.  7. The aortic valve is tricuspid. There is mild calcification of the aortic valve. There is mild thickening of the aortic valve. Aortic valve regurgitation is not  visualized. Aortic valve sclerosis/calcification is present, without any evidence of aortic stenosis.  8. The inferior vena cava is dilated in size with <50% respiratory variability, suggesting right atrial pressure of 15 mmHg. Comparison(s): Compared to prior TTE on 05/2021, LVEF remains stable at 40-45%, MR appears slightly less on current study, there continues to be severe TR. FINDINGS  Left Ventricle: Left ventricular ejection fraction, by estimation, is 40 to 45%. The left ventricle has mildly decreased function. The left ventricle demonstrates global hypokinesis. The left ventricular internal cavity size was mildly to moderately dilated. There is no left ventricular hypertrophy. Left ventricular diastolic parameters are indeterminate. Right Ventricle: The right ventricular size is moderately-to-severely enlarged. No increase in right ventricular wall thickness. Right ventricular systolic function is mildly reduced. There is moderately elevated pulmonary artery systolic pressure. The tricuspid regurgitant velocity is 2.87 m/s, and with an assumed right atrial pressure of 15 mmHg, the estimated right ventricular systolic pressure is 52.8 mmHg. Left Atrium: Left atrial size was severely dilated. Right Atrium: Right atrial size was severely dilated. Pericardium: Trivial pericardial effusion is present. The pericardial effusion is circumferential. Mitral Valve: The mitral valve is abnormal. There is mild thickening of the mitral valve leaflet(s). There is at least moderate mainly posteriorly directed (although there are multiple MR jets) functional/secondary mitral regurgitation likely due to LA and LV dilation. mitral valve regurgitation. Tricuspid Valve: The tricuspid valve is normal in structure. Tricuspid valve regurgitation is severe. Aortic Valve: The aortic valve is tricuspid. There is mild calcification of the aortic valve. There is mild thickening of the aortic valve. Aortic valve regurgitation is not  visualized. Aortic valve sclerosis/calcification is present, without any evidence of aortic stenosis. Pulmonic Valve: The pulmonic valve was normal in structure. Pulmonic valve regurgitation is trivial. Aorta: The aortic root and ascending aorta are structurally normal, with no evidence of dilitation. Venous: The inferior vena cava is dilated in size with less than 50% respiratory variability, suggesting right  atrial pressure of 15 mmHg. IAS/Shunts: The atrial septum is grossly normal.  LEFT VENTRICLE PLAX 2D LVIDd:         6.50 cm      Diastology LVIDs:         5.00 cm      LV e' medial:    10.00 cm/s LV PW:         1.10 cm      LV E/e' medial:  10.3 LV IVS:        1.00 cm      LV e' lateral:   14.10 cm/s LVOT diam:     2.20 cm      LV E/e' lateral: 7.3 LV SV:         59 LV SV Index:   32 LVOT Area:     3.80 cm  LV Volumes (MOD) LV vol d, MOD A2C: 138.0 ml LV vol d, MOD A4C: 103.0 ml LV vol s, MOD A2C: 82.8 ml LV vol s, MOD A4C: 71.6 ml LV SV MOD A2C:     55.2 ml LV SV MOD A4C:     103.0 ml LV SV MOD BP:      39.3 ml RIGHT VENTRICLE RV S prime:     11.60 cm/s TAPSE (M-mode): 1.9 cm LEFT ATRIUM              Index        RIGHT ATRIUM           Index LA diam:        6.50 cm  3.54 cm/m   RA Area:     31.40 cm LA Vol (A2C):   124.0 ml 67.63 ml/m  RA Volume:   112.00 ml 61.08 ml/m LA Vol (A4C):   130.0 ml 70.90 ml/m LA Biplane Vol: 133.0 ml 72.54 ml/m  AORTIC VALVE LVOT Vmax:   86.70 cm/s LVOT Vmean:  58.500 cm/s LVOT VTI:    0.156 m  AORTA Ao Root diam: 3.50 cm Ao Asc diam:  3.40 cm MITRAL VALVE                TRICUSPID VALVE MV Area (PHT): 3.50 cm     TR Peak grad:   32.9 mmHg MV Decel Time: 217 msec     TR Vmax:        287.00 cm/s MV E velocity: 103.00 cm/s MV A velocity: 36.10 cm/s   SHUNTS MV E/A ratio:  2.85         Systemic VTI:  0.16 m                             Systemic Diam: 2.20 cm Gwyndolyn Kaufman MD Electronically signed by Gwyndolyn Kaufman MD Signature Date/Time: 04/02/2022/12:16:30 PM    Final      Medications:  ferric gluconate (FERRLECIT) IVPB 62.5 mg (04/03/22 0835)    Chlorhexidine Gluconate Cloth  6 each Topical Q0600   cinacalcet  30 mg Oral Q T,Th,Sa-HD   ciprofloxacin  250 mg Oral Daily   darbepoetin (ARANESP) injection - DIALYSIS  200 mcg Intravenous Q Tue-HD   doxercalciferol  7 mcg Intravenous Q T,Th,Sa-HD   hydroxyurea  500 mg Oral Daily   pantoprazole  40 mg Oral BID   sodium chloride flush  3 mL Intravenous Q12H   torsemide  20 mg Oral QID    Dialysis Orders: SW TTS 4h 62min  400/500   64kg  2/2.5 bath  P2  Hep none  RUA AVF - last HD 10/12, post wt 63.4kg (1.3 L removed) - last Hb 7.6 - mircera 225 q 2, last 10/3, due 10/17 - venofer 50 mg weekly - hectorol 7 ug iv tiw tts - sensipar 30 mg tiw po tts     Assessment/ Plan: Abd pain / N/ V - CT abd showed large 25 cm cyst-like structure which may be causing his pain, N/V. N/V improved today per pt.  Plan for EUS per GI.  May benefit from cyst aspiration ?  ESRD - on HD TTS.  Missed last HD.  No acute indication for HD today.  Plan for HD today per regular schedule via AVF.  Sickle cell painful crisis - per pmd Elevated troponin - Per cardiology not ACS.  Likely 2/2 ischemia d/t severe anemia.  Atrial fib - per pmd /cards BP/ volume - CXR and CT chest do not show signs of fluid overload. Does not appear volume overloaded on exam.  Per patient he suspects weight loss w/GI upset, will plan for minimal UF w/HD  BP in goal -  even low  Anemia of chronic disease/sickle cell - Hgb 6.8 s/p 1 unit pRBC 10/15.  Transfusion threshold 6 per PMD. On aranesp 200 qTues, IV iron ordered.  Secondary hyperparathyroidism - Ca in goal.  Phos elevated.  Resume binders when diet advanced.  Elevated LFT"s - tbili 2.2, AST 79. GI consulting.  Nutrition - Currently NPO. Renal diet w/fluid restrictions when advanced. Alb 2.7 - add protein supplements when diet advanced.  [facility] 04/03/2022,10:30 AM  LOS: 2 days

## 2022-04-03 NOTE — H&P (View-Only) (Signed)
Daily Rounding Note  04/03/2022, 2:01 PM  LOS: 2 days   SUBJECTIVE:   Chief complaint:      Hgb declined.  Got PRBC today, 2nd thus far.  BM this AM not described as melenic or bloody.  Pt is blind and not aware of the color.   Persistent back pain.  Pai in mid abdomen persists Appetite remains depressed, no nausea.  Tolerating solid food  OBJECTIVE:         Vital signs in last 24 hours:    Temp:  [97.6 F (36.4 C)-98 F (36.7 C)] 98 F (36.7 C) (10/17 1255) Pulse Rate:  [71-156] 89 (10/17 1257) Resp:  [9-28] 17 (10/17 1257) BP: (86-123)/(55-84) 107/67 (10/17 1257) SpO2:  [88 %-100 %] 100 % (10/17 1257) Weight:  [62.5 kg-65.6 kg] 62.5 kg (10/17 1224) Last BM Date : 04/02/22 Filed Weights   04/03/22 0538 04/03/22 0743 04/03/22 1224  Weight: 65.6 kg 63.1 kg 62.5 kg   General: frail\.  Looks chr ill   Heart: RRR Chest: clear bil Abdomen: tense,  tender > on R.  Active BS Extremities: thin.  No edema Neuro/Psych:  alert, no confusion, fluid speech.  Blind.    Intake/Output from previous day: 10/16 0701 - 10/17 0700 In: 493 [P.O.:290; I.V.:3; IV Piggyback:200] Out: 75 [Urine:75]  Intake/Output this shift: Total I/O In: 365 [Blood:365] Out: 2100 [Other:2100]  Lab Results: Recent Labs    04/01/22 1158 04/02/22 0347 04/03/22 0745  WBC 7.8 8.4 9.1  HGB 6.1* 6.8* 6.1*  HCT 16.4* 18.2* 16.2*  PLT 174 166 164   BMET Recent Labs    03/31/22 1427 03/31/22 1731 04/02/22 0347 04/03/22 0320  NA 132* 129* 132* 131*  K 3.5 3.7 5.1 4.4  CL 89*  --  90* 91*  CO2 19*  --  19* 20*  GLUCOSE 84  --  78 106*  BUN 52*  --  63* 80*  CREATININE 7.35*  --  10.00* 10.75*  CALCIUM 9.1  --  8.7* 8.0*   LFT Recent Labs    03/31/22 1427 04/02/22 0347 04/03/22 0320  PROT 8.1  --   --   ALBUMIN 3.8 2.7* 2.6*  AST 79*  --   --   ALT 28  --   --   ALKPHOS 487*  --   --   BILITOT 2.2*  --   --     PT/INR Recent Labs    03/31/22 1427  LABPROT 17.2*  INR 1.4*   Hepatitis Panel Recent Labs    04/02/22 1746 04/03/22 0320  HEPBSAG NON REACTIVE NON REACTIVE  HCVAB NON REACTIVE  --     Studies/Results: ECHOCARDIOGRAM COMPLETE  Result Date: 04/02/2022    ECHOCARDIOGRAM REPORT   Patient Name:   Patrick [name] Date of Exam: 04/02/2022 Medical Rec #:  [id number]           Height:       71.0 in Accession #:    [id number]          Weight:       144.0 lb Date of Birth:  11-Sep-1955           BSA:          1.834 m Patient Age:    66 years            BP:           93/69 mmHg Patient Gender: M  HR:           96 bpm. Exam Location:  Inpatient Procedure: 2D Echo, Color Doppler and Cardiac Doppler Indications:    elevated trops  History:        Patient has prior history of Echocardiogram examinations, most                 recent 05/23/2021. CHF, Arrythmias:Atrial Fibrillation,                 Signs/Symptoms:Chest Pain; Risk Factors:Hypertension.  Sonographer:    Melissa Morford RDCS (AE, PE) Referring Phys: [phone] RONDELL A SMITH IMPRESSIONS  1. Left ventricular ejection fraction, by estimation, is 40 to 45%. The left ventricle has mildly decreased function. The left ventricle demonstrates global hypokinesis. The left ventricular internal cavity size was mildly to moderately dilated. Left ventricular diastolic parameters are indeterminate.  2. Right ventricular systolic function is mildly reduced. The right ventricular size is moderately-to-severely enlarged. There is moderately elevated pulmonary artery systolic pressure. The estimated right ventricular systolic pressure is 75.4 mmHg.  3. Left atrial size was severely dilated.  4. Right atrial size was severely dilated.  5. The mitral valve is abnormal. There is at least moderate, mainly posteriorly directed (although there are multiple MR jets) functional/secondary mitral regurgitation likely due to LA and LV dilation.  6. Tricuspid  valve regurgitation is severe.  7. The aortic valve is tricuspid. There is mild calcification of the aortic valve. There is mild thickening of the aortic valve. Aortic valve regurgitation is not visualized. Aortic valve sclerosis/calcification is present, without any evidence of aortic stenosis.  8. The inferior vena cava is dilated in size with <50% respiratory variability, suggesting right atrial pressure of 15 mmHg. Comparison(s): Compared to prior TTE on 05/2021, LVEF remains stable at 40-45%, MR appears slightly less on current study, there continues to be severe TR. FINDINGS  Left Ventricle: Left ventricular ejection fraction, by estimation, is 40 to 45%. The left ventricle has mildly decreased function. The left ventricle demonstrates global hypokinesis. The left ventricular internal cavity size was mildly to moderately dilated. There is no left ventricular hypertrophy. Left ventricular diastolic parameters are indeterminate. Right Ventricle: The right ventricular size is moderately-to-severely enlarged. No increase in right ventricular wall thickness. Right ventricular systolic function is mildly reduced. There is moderately elevated pulmonary artery systolic pressure. The tricuspid regurgitant velocity is 2.87 m/s, and with an assumed right atrial pressure of 15 mmHg, the estimated right ventricular systolic pressure is 36.0 mmHg. Left Atrium: Left atrial size was severely dilated. Right Atrium: Right atrial size was severely dilated. Pericardium: Trivial pericardial effusion is present. The pericardial effusion is circumferential. Mitral Valve: The mitral valve is abnormal. There is mild thickening of the mitral valve leaflet(s). There is at least moderate mainly posteriorly directed (although there are multiple MR jets) functional/secondary mitral regurgitation likely due to LA and LV dilation. mitral valve regurgitation. Tricuspid Valve: The tricuspid valve is normal in structure. Tricuspid valve  regurgitation is severe. Aortic Valve: The aortic valve is tricuspid. There is mild calcification of the aortic valve. There is mild thickening of the aortic valve. Aortic valve regurgitation is not visualized. Aortic valve sclerosis/calcification is present, without any evidence of aortic stenosis. Pulmonic Valve: The pulmonic valve was normal in structure. Pulmonic valve regurgitation is trivial. Aorta: The aortic root and ascending aorta are structurally normal, with no evidence of dilitation. Venous: The inferior vena cava is dilated in size with less than 50% respiratory variability, suggesting right  atrial pressure of 15 mmHg. IAS/Shunts: The atrial septum is grossly normal.  LEFT VENTRICLE PLAX 2D LVIDd:         6.50 cm      Diastology LVIDs:         5.00 cm      LV e' medial:    10.00 cm/s LV PW:         1.10 cm      LV E/e' medial:  10.3 LV IVS:        1.00 cm      LV e' lateral:   14.10 cm/s LVOT diam:     2.20 cm      LV E/e' lateral: 7.3 LV SV:         59 LV SV Index:   32 LVOT Area:     3.80 cm  LV Volumes (MOD) LV vol d, MOD A2C: 138.0 ml LV vol d, MOD A4C: 103.0 ml LV vol s, MOD A2C: 82.8 ml LV vol s, MOD A4C: 71.6 ml LV SV MOD A2C:     55.2 ml LV SV MOD A4C:     103.0 ml LV SV MOD BP:      39.3 ml RIGHT VENTRICLE RV S prime:     11.60 cm/s TAPSE (M-mode): 1.9 cm LEFT ATRIUM              Index        RIGHT ATRIUM           Index LA diam:        6.50 cm  3.54 cm/m   RA Area:     31.40 cm LA Vol (A2C):   124.0 ml 67.63 ml/m  RA Volume:   112.00 ml 61.08 ml/m LA Vol (A4C):   130.0 ml 70.90 ml/m LA Biplane Vol: 133.0 ml 72.54 ml/m  AORTIC VALVE LVOT Vmax:   86.70 cm/s LVOT Vmean:  58.500 cm/s LVOT VTI:    0.156 m  AORTA Ao Root diam: 3.50 cm Ao Asc diam:  3.40 cm MITRAL VALVE                TRICUSPID VALVE MV Area (PHT): 3.50 cm     TR Peak grad:   32.9 mmHg MV Decel Time: 217 msec     TR Vmax:        287.00 cm/s MV E velocity: 103.00 cm/s MV A velocity: 36.10 cm/s   SHUNTS MV E/A ratio:  2.85          Systemic VTI:  0.16 m                             Systemic Diam: 2.20 cm Gwyndolyn Kaufman MD Electronically signed by Gwyndolyn Kaufman MD Signature Date/Time: 04/02/2022/12:16:30 PM    Final     ASSESMENT:   Upper abdominal cyst w mass effect to adjacent organs.. 04/02/2022 EGD showing small hiatal hernia.  Irregular Z-line.  Extrinsic compression to the stomach.  Presence of previously placed endoclips in the gastric body.  Erosive gastropathy with out bleeding.  Extrinsic duodenal deformity.  Appearance suggesting prior sphincterotomy at major papilla.  Biopsies obtained of the stomach. 04/02/2022 EUS.  Cystic lesion seen in the abdominal cavity/peripancreatic region, FNA cytology obtained and pending.  Appearance suspicious for pancreatic pseudocyst versus biloma.  ESRD.  Elevated LFTs, T. bili fluctuating, alk Phos rising, AST rising, ALT consistently normal.  Choledocholithiasis hx ERCP  sphincterotomy, stone extraction,  pancreatic and CBD stent placement 05/2021.  Lap chole 05/2021. HCV negative, HBV immune.      Anemia. Hgb 7.8.. 6.1.. 6.8.. 6.1.   FOBT positive    PLAN   Fluid cytology, fluid lipase level, fluid cell count, fluid T. bili level are all in progress Pathology from locations in the stomach are also pending.   Plan ERCP w possible stent to assess/address for possible bile leak tmrw AM, Wednesday.    Azucena Freed  04/03/2022, 2:01 PM Phone [phone]    Attending Physician Note   I have taken an interval history, reviewed the chart and examined the patient. I performed a substantive portion of this encounter, including complete performance of at least one of the key components, in conjunction with the APP. I agree with the APP's note, impression and recommendations with my edits. My additional impressions and recommendations are as follows.   Suspected bile leak with a large biloma. We contacted the lab as the fluid from the EUS cyst aspiration has not been  resulted. The specimen has not been sent for analysis however it is not clear what happened. ERCP with biliary stent placement is planned for tomorrow am. Will discuss with Dr. Rush Landmark tomorrow regarding options for cyst drainage after ERCP.    Lucio Edward, MD [facility] See [facility], [facility], for our on call provider

## 2022-04-03 NOTE — Progress Notes (Signed)
   04/03/22 1224  Vitals  Temp 97.8 F (36.6 C)  Temp Source Oral  BP 107/68  MAP (mmHg) 78  BP Location Right Arm  BP Method Automatic  Patient Position (if appropriate) Lying  Pulse Rate 79  Pulse Rate Source Monitor  ECG Heart Rate 90  Resp (!) 21  Oxygen Therapy  SpO2 (!) 88 %  O2 Device Room Air  Pulse Oximetry Type Continuous   Received patient in bed to unit.  Alert and oriented.  Informed consent signed and in chart.   Treatment initiated: 0752 Treatment completed: 1213  Patient tolerated well.  Transported back to the room  Alert, without acute distress.  Hand-off given to patient's nurse.   Access used: AVF Access issues: NA  Total UF removed: 2100 ml Medication(s) given: Aranesp 268mcg IVP, Hectorol 29mcg IVP, Ferric Gluconate 62.5mg  IVBP, 1 unit PRBC Post HD VS: see above Post HD weight: 62.5kg   [facility]

## 2022-04-03 NOTE — Plan of Care (Signed)
  Problem: Respiratory: Goal: Pulmonary complications will be avoided or minimized Outcome: Progressing   Problem: Sensory: Goal: Pain level will decrease with appropriate interventions Outcome: Progressing   Problem: Education: Goal: Knowledge of General Education information will improve Description: Including pain rating scale, medication(s)/side effects and non-pharmacologic comfort measures Outcome: Progressing   Problem: Nutrition: Goal: Adequate nutrition will be maintained Outcome: Progressing   Problem: Coping: Goal: Level of anxiety will decrease Outcome: Progressing   Problem: Elimination: Goal: Will not experience complications related to bowel motility Outcome: Progressing Goal: Will not experience complications related to urinary retention Outcome: Progressing

## 2022-04-03 NOTE — Procedures (Signed)
Patient was seen on dialysis and the procedure was supervised.  BFR 400  Via AVF BP is  97/62.   Patient appears to be tolerating treatment well  Patrick [name] 04/03/2022

## 2022-04-04 ENCOUNTER — Encounter (HOSPITAL_COMMUNITY): Payer: Self-pay | Admitting: Internal Medicine

## 2022-04-04 ENCOUNTER — Inpatient Hospital Stay (HOSPITAL_COMMUNITY): Payer: HMO | Admitting: Anesthesiology

## 2022-04-04 ENCOUNTER — Inpatient Hospital Stay (HOSPITAL_COMMUNITY): Payer: HMO

## 2022-04-04 ENCOUNTER — Encounter (HOSPITAL_COMMUNITY)
Admission: EM | Disposition: A | Discharge status: Home / Self Care | Payer: Self-pay | Source: Home / Self Care | Attending: Internal Medicine

## 2022-04-04 DIAGNOSIS — K839 Disease of biliary tract, unspecified: Secondary | ICD-10-CM

## 2022-04-04 DIAGNOSIS — I5022 Chronic systolic (congestive) heart failure: Secondary | ICD-10-CM | POA: Diagnosis not present

## 2022-04-04 DIAGNOSIS — J449 Chronic obstructive pulmonary disease, unspecified: Secondary | ICD-10-CM | POA: Diagnosis not present

## 2022-04-04 DIAGNOSIS — Z4659 Encounter for fitting and adjustment of other gastrointestinal appliance and device: Secondary | ICD-10-CM

## 2022-04-04 DIAGNOSIS — Z87891 Personal history of nicotine dependence: Secondary | ICD-10-CM | POA: Diagnosis not present

## 2022-04-04 DIAGNOSIS — R7989 Other specified abnormal findings of blood chemistry: Secondary | ICD-10-CM | POA: Diagnosis not present

## 2022-04-04 DIAGNOSIS — I4891 Unspecified atrial fibrillation: Secondary | ICD-10-CM

## 2022-04-04 DIAGNOSIS — R188 Other ascites: Secondary | ICD-10-CM | POA: Diagnosis not present

## 2022-04-04 DIAGNOSIS — K838 Other specified diseases of biliary tract: Secondary | ICD-10-CM

## 2022-04-04 DIAGNOSIS — N186 End stage renal disease: Secondary | ICD-10-CM | POA: Diagnosis not present

## 2022-04-04 HISTORY — PX: SPHINCTEROTOMY: SHX5279

## 2022-04-04 HISTORY — PX: BILIARY STENT PLACEMENT: SHX5538

## 2022-04-04 HISTORY — PX: ERCP: SHX5425

## 2022-04-04 LAB — CBC WITH DIFFERENTIAL/PLATELET
Abs Immature Granulocytes: 0.41 10*3/uL — ABNORMAL HIGH (ref 0.00–0.07)
Basophils Absolute: 0 10*3/uL (ref 0.0–0.1)
Basophils Relative: 0 %
Eosinophils Absolute: 0.2 10*3/uL (ref 0.0–0.5)
Eosinophils Relative: 2 %
HCT: 18.7 % — ABNORMAL LOW (ref 39.0–52.0)
Hemoglobin: 6.7 g/dL — CL (ref 13.0–17.0)
Immature Granulocytes: 4 %
Lymphocytes Relative: 2 %
Lymphs Abs: 0.2 10*3/uL — ABNORMAL LOW (ref 0.7–4.0)
MCH: 36 pg — ABNORMAL HIGH (ref 26.0–34.0)
MCHC: 35.8 g/dL (ref 30.0–36.0)
MCV: 100.5 fL — ABNORMAL HIGH (ref 80.0–100.0)
Monocytes Absolute: 1.7 10*3/uL — ABNORMAL HIGH (ref 0.1–1.0)
Monocytes Relative: 19 %
Neutro Abs: 6.8 10*3/uL (ref 1.7–7.7)
Neutrophils Relative %: 73 %
Platelets: 170 10*3/uL (ref 150–400)
RBC: 1.86 MIL/uL — ABNORMAL LOW (ref 4.22–5.81)
RDW: 26.5 % — ABNORMAL HIGH (ref 11.5–15.5)
WBC: 9.4 10*3/uL (ref 4.0–10.5)
nRBC: 16.2 % — ABNORMAL HIGH (ref 0.0–0.2)

## 2022-04-04 LAB — BASIC METABOLIC PANEL
Anion gap: 17 — ABNORMAL HIGH (ref 5–15)
BUN: 34 mg/dL — ABNORMAL HIGH (ref 8–23)
CO2: 24 mmol/L (ref 22–32)
Calcium: 7.8 mg/dL — ABNORMAL LOW (ref 8.9–10.3)
Chloride: 96 mmol/L — ABNORMAL LOW (ref 98–111)
Creatinine, Ser: 5.62 mg/dL — ABNORMAL HIGH (ref 0.61–1.24)
GFR, Estimated: 11 mL/min — ABNORMAL LOW (ref >60)
Glucose, Bld: 94 mg/dL (ref 70–99)
Potassium: 3.9 mmol/L (ref 3.5–5.1)
Sodium: 137 mmol/L (ref 135–145)

## 2022-04-04 LAB — HEMOGLOBIN AND HEMATOCRIT, BLOOD
HCT: 23.7 % — ABNORMAL LOW (ref 39.0–52.0)
Hemoglobin: 8.5 g/dL — ABNORMAL LOW (ref 13.0–17.0)

## 2022-04-04 LAB — SURGICAL PATHOLOGY

## 2022-04-04 LAB — PREPARE RBC (CROSSMATCH)

## 2022-04-04 SURGERY — ENDOSCOPIC RETROGRADE CHOLANGIOPANCREATOGRAPHY (ERCP) WITH PROPOFOL
Anesthesia: General

## 2022-04-04 SURGERY — ERCP, WITH INTERVENTION IF INDICATED
Anesthesia: General

## 2022-04-04 MED ORDER — ROCURONIUM BROMIDE 10 MG/ML (PF) SYRINGE
PREFILLED_SYRINGE | INTRAVENOUS | Status: DC | PRN
  Administered 2022-04-04: 60 mg via INTRAVENOUS

## 2022-04-04 MED ORDER — MEPERIDINE HCL 25 MG/ML IJ SOLN: 6.2500 mg | INTRAMUSCULAR | Status: DC | PRN

## 2022-04-04 MED ORDER — ONDANSETRON HCL 4 MG/2ML IJ SOLN
INTRAMUSCULAR | Status: DC | PRN
  Administered 2022-04-04: 4 mg via INTRAVENOUS

## 2022-04-04 MED ORDER — OXYCODONE HCL 5 MG PO TABS: 5.0000 mg | ORAL_TABLET | Freq: Once | ORAL | Status: DC | PRN

## 2022-04-04 MED ORDER — LIDOCAINE 2% (20 MG/ML) 5 ML SYRINGE
INTRAMUSCULAR | Status: DC | PRN
  Administered 2022-04-04: 100 mg via INTRAVENOUS

## 2022-04-04 MED ORDER — PROPOFOL 10 MG/ML IV BOLUS
INTRAVENOUS | Status: DC | PRN
  Administered 2022-04-04 (×2): 50 mg via INTRAVENOUS

## 2022-04-04 MED ORDER — GLUCAGON HCL RDNA (DIAGNOSTIC) 1 MG IJ SOLR
INTRAMUSCULAR | Status: DC | PRN
  Administered 2022-04-04 (×3): .25 mg via INTRAVENOUS

## 2022-04-04 MED ORDER — ACETAMINOPHEN 160 MG/5ML PO SOLN: 325.0000 mg | ORAL | Status: DC | PRN

## 2022-04-04 MED ORDER — PHENYLEPHRINE HCL-NACL 20-0.9 MG/250ML-% IV SOLN
INTRAVENOUS | Status: DC | PRN
  Administered 2022-04-04: 40 ug/min via INTRAVENOUS

## 2022-04-04 MED ORDER — OXYCODONE HCL 5 MG/5ML PO SOLN: 5.0000 mg | Freq: Once | ORAL | Status: DC | PRN

## 2022-04-04 MED ORDER — GLUCAGON HCL RDNA (DIAGNOSTIC) 1 MG IJ SOLR
INTRAMUSCULAR | Status: AC
  Filled 2022-04-04: qty 1

## 2022-04-04 MED ORDER — SODIUM CHLORIDE 0.9 % IV SOLN: INTRAVENOUS | Status: DC | PRN

## 2022-04-04 MED ORDER — DICLOFENAC SUPPOSITORY 100 MG
RECTAL | Status: DC | PRN
  Administered 2022-04-04: 100 mg via RECTAL

## 2022-04-04 MED ORDER — CIPROFLOXACIN IN D5W 400 MG/200ML IV SOLN
INTRAVENOUS | Status: AC
  Filled 2022-04-04: qty 200

## 2022-04-04 MED ORDER — ACETAMINOPHEN 325 MG PO TABS: 325.0000 mg | ORAL_TABLET | ORAL | Status: DC | PRN

## 2022-04-04 MED ORDER — FENTANYL CITRATE (PF) 100 MCG/2ML IJ SOLN: 25.0000 ug | INTRAMUSCULAR | Status: DC | PRN

## 2022-04-04 MED ORDER — SUGAMMADEX SODIUM 200 MG/2ML IV SOLN
INTRAVENOUS | Status: DC | PRN
  Administered 2022-04-04: 200 mg via INTRAVENOUS

## 2022-04-04 MED ORDER — CHLORHEXIDINE GLUCONATE CLOTH 2 % EX PADS: 6.0000 | MEDICATED_PAD | Freq: Every day | CUTANEOUS | Status: DC

## 2022-04-04 MED ORDER — DICLOFENAC SUPPOSITORY 100 MG: 100.0000 mg | Freq: Once | RECTAL | Status: DC

## 2022-04-04 MED ORDER — SODIUM CHLORIDE 0.9 % IV SOLN: 3.0000 g | Freq: Once | INTRAVENOUS | Status: DC

## 2022-04-04 MED ORDER — SODIUM CHLORIDE 0.9% IV SOLUTION: Freq: Once | INTRAVENOUS | Status: DC

## 2022-04-04 MED ORDER — FENTANYL CITRATE (PF) 100 MCG/2ML IJ SOLN
INTRAMUSCULAR | Status: DC | PRN
  Administered 2022-04-04 (×2): 50 ug via INTRAVENOUS

## 2022-04-04 MED ORDER — CIPROFLOXACIN IN D5W 400 MG/200ML IV SOLN
INTRAVENOUS | Status: DC | PRN
  Administered 2022-04-04: 400 mg via INTRAVENOUS

## 2022-04-04 MED ORDER — INDOMETHACIN 50 MG RE SUPP
RECTAL | Status: AC
  Filled 2022-04-04: qty 2

## 2022-04-04 MED ORDER — PHENYLEPHRINE 80 MCG/ML (10ML) SYRINGE FOR IV PUSH (FOR BLOOD PRESSURE SUPPORT)
PREFILLED_SYRINGE | INTRAVENOUS | Status: DC | PRN
  Administered 2022-04-04: 160 ug via INTRAVENOUS

## 2022-04-04 MED ORDER — ALBUMIN HUMAN 5 % IV SOLN: INTRAVENOUS | Status: DC | PRN

## 2022-04-04 MED ORDER — BOOST / RESOURCE BREEZE PO LIQD CUSTOM
1.0000 | Freq: Three times a day (TID) | ORAL | Status: DC
  Administered 2022-04-04 – 2022-04-08 (×7): 1 via ORAL

## 2022-04-04 MED ORDER — PROSOURCE PLUS PO LIQD
30.0000 mL | Freq: Three times a day (TID) | ORAL | Status: DC
  Administered 2022-04-04 – 2022-04-08 (×8): 30 mL via ORAL
  Filled 2022-04-04 (×9): qty 30

## 2022-04-04 MED ORDER — DICLOFENAC SUPPOSITORY 100 MG
RECTAL | Status: AC
  Filled 2022-04-04: qty 1

## 2022-04-04 MED ORDER — SODIUM CHLORIDE 0.9 % IV SOLN
INTRAVENOUS | Status: DC | PRN
  Administered 2022-04-04: 30 mL

## 2022-04-04 NOTE — Progress Notes (Signed)
Initial Nutrition Assessment  DOCUMENTATION CODES:   Severe malnutrition in context of chronic illness  INTERVENTION:  Advance to a regular diet as tolerated. Would discourage advancing to renal diet as pt is severely malnourished with recent poor PO Boost Breeze po TID, each supplement provides 250 kcal and 9 grams of protein Prosource Plus TID, this will provide 100kcal and 15g of protein per serving.  Nursing to assist with tray set-up/feeding if needed Will request ordering assist from kitchen when diet advanced as pt is vision impaired  NUTRITION DIAGNOSIS:   Severe Malnutrition related to chronic illness (ESRD, CHF) as evidenced by severe muscle depletion, severe fat depletion.  GOAL:   Patient will meet greater than or equal to 90% of their needs  MONITOR:   PO intake, Diet advancement, Labs, I & O's, Weight trends, Supplement acceptance  REASON FOR ASSESSMENT:   Malnutrition Screening Tool    ASSESSMENT:   Pt with hx of CHF, ESRD on HD, sickle cell disease, and blindness presented to ED in a sickle cell crisis. Imaging in ED also suggestive of a pseudocyst, GI consulted for further workup  10/16 - EGD: Extrinsic compression in the entire stomach. Erosive gastropathy, duodenal extrinsic deformity. EUS: A cystic lesion was seen in the abdominal cavity/peripancreatic region, possible pancreatitic pseudocyst  10/18 - ERCP, Biliary stent placed, gastric antrum ulcer. diffuse erosive gastritis, marked extrinsic gastric compression.  Pt resting in bed at the time of assessment. Recently returned from ERCP. Diet still NPO, scheduled to be advanced to clears at [facility]. Pt reports that he has been eating poorly for ~1.5 weeks due to his abdominal pain. Prior to that was eating well and "everything in sight."   On physical exam, pt extremely depleted in fat and muscle stores indicative of long term undernourishment. Pt states that he has lost weight in the last week and a half while  intake was poor, states he has noticed particularly in his legs and that energy has also declined.   Pt has had nutrition supplements in the past, states that he did drink them at home after his last admission but has not been drinking them recently. States at HD, they no longer give protein shakes, they have switched to protein bars in their place.   EDW: 64kg Access: RUA AVF Last HD: 10/17 Total UF: 2100  Average Meal Intake: 10/16: 100% intake x 1 recorded meals  Nutritionally Relevant Medications: Scheduled Meds:  cinacalcet  30 mg Oral Q T,Th,Sa-HD   ciprofloxacin  250 mg Oral Daily   diphenhydrAMINE  25 mg Oral Once   doxercalciferol  7 mcg Intravenous Q T,Th,Sa-HD   hydroxyurea  500 mg Oral Daily   pantoprazole  40 mg Oral BID   torsemide  20 mg Oral QID   Continuous Infusions:  ferric gluconate (FERRLECIT) IVPB 62.5 mg (04/03/22 0835)   PRN Meds: trimethobenzamide  Labs Reviewed: Chloride 96 BUN 34, creatinine 5.62 Hgb 6.7  NUTRITION - FOCUSED PHYSICAL EXAM: Flowsheet Row Most Recent Value  Orbital Region Moderate depletion  Upper Arm Region Severe depletion  Thoracic and Lumbar Region Severe depletion  Buccal Region Moderate depletion  Temple Region Moderate depletion  Clavicle Bone Region Severe depletion  Clavicle and Acromion Bone Region Severe depletion  Scapular Bone Region Moderate depletion  Dorsal Hand Severe depletion  Patellar Region Severe depletion  Anterior Thigh Region Severe depletion  Posterior Calf Region Severe depletion  Hair Reviewed  Eyes Reviewed  Mouth Reviewed  Skin Reviewed  Nails Reviewed  Diet Order:   Diet Order             Diet clear liquid Room service appropriate? Yes with Assist; Fluid consistency: Thin  Diet effective now                   EDUCATION NEEDS:  Education needs have been addressed  Skin:  Skin Assessment: Reviewed RN Assessment  Last BM:  10/16  Height:   Ht Readings from Last 1  Encounters:  04/02/22 5\' 11"  (1.803 m)    Weight:   Wt Readings from Last 1 Encounters:  04/03/22 62.5 kg    Ideal Body Weight:  78.2 kg  BMI:  Body mass index is 19.22 kg/m.  Estimated Nutritional Needs:   Kcal:  2000-2200 kcal/d  Protein:  100-120 g/d  Fluid:  1L+UOP    Ranell Patrick, RD, LDN Clinical Dietitian RD pager # available in [facility]  After hours/weekend pager # available in [facility]

## 2022-04-04 NOTE — Anesthesia Preprocedure Evaluation (Addendum)
Anesthesia Evaluation  Patient identified by MRN, date of birth, ID band Patient awake    Reviewed: Allergy & Precautions, NPO status , Patient's Chart, lab work & pertinent test results  History of Anesthesia Complications Negative for: history of anesthetic complications  Airway Mallampati: II  TM Distance: >3 FB Neck ROM: Full    Dental  (+) Missing, Poor Dentition, Chipped, Dental Advisory Given,    Pulmonary COPD, former smoker   Pulmonary exam normal        Cardiovascular hypertension, +CHF  Normal cardiovascular exam+ dysrhythmias Atrial Fibrillation   TTE 2023: EF 40-45%, global hypokinesis, mild to moderately LVE, mild RV dysfunction, moderate to severe RVE, moderately elevated PASP 47.9 mmHg, severe LAE/RAE, at least moderate, mainly posteriorly directed (although there are multiple MR jets) functional/secondary mitral regurgitation likely due to LA and LV dilation, severe TR   Neuro/Psych negative neurological ROS  negative psych ROS   GI/Hepatic Neg liver ROS, hiatal hernia,GERD  Medicated and Controlled,,  Endo/Other  negative endocrine ROS    Renal/GU ESRF and DialysisRenal disease (last HD last Thursday (missed Saturday))  negative genitourinary   Musculoskeletal negative musculoskeletal ROS (+)    Abdominal   Peds  Hematology  (+) Blood dyscrasia, Sickle cell anemia and anemia   Anesthesia Other Findings Day of surgery medications reviewed with patient.  Reproductive/Obstetrics negative OB ROS                             Anesthesia Physical  Anesthesia Plan  ASA: 4  Anesthesia Plan: General   Post-op Pain Management: Minimal or no pain anticipated   Induction: Intravenous  PONV Risk Score and Plan: 1 and Treatment may vary due to age or medical condition and Propofol infusion  Airway Management Planned: Oral ETT  Additional Equipment: None  Intra-op Plan:    Post-operative Plan: Extubation in OR  Informed Consent: I have reviewed the patients History and Physical, chart, labs and discussed the procedure including the risks, benefits and alternatives for the proposed anesthesia with the patient or authorized representative who has indicated his/her understanding and acceptance.   Patient has DNR.  Discussed DNR with patient and Suspend DNR.     Plan Discussed with: CRNA and Anesthesiologist  Anesthesia Plan Comments: ( HPI: Patrick [name] is a 66 y.o. male with medical history significant of sickle cell anemia, paroxysmal atrial fibrillation, chronic pain syndrome on chronic opioids, ESRD on HD TTS, legally blind, history of GI bleed, former smoker who presented with complaints of back and leg pain starting 3 days ago.  Pain is in his entire back and bilateral legs.  Noted symptoms were not relieved with his home medications of oxycodone.  Patient thought he was going into sickle cell crisis.  Notes associated symptoms of an new minimally productive cough, nausea,  decreased appetite over the last week,  dry heaves, and intermittent crampy epigastric pain.  Denies having any significant fever, chest pain, or palpitations to his knowledge.  He had tried taking Mucinex without any significant improvement in symptoms.  He is unsure if he has had any blood in his stools close his vision is poor at baseline.  He last dialyzed on 10/12 and reported that he was underweight at that time.  He did not go to dialysis yesterday as he was underweight previously.  Patient notes that he still does make some urine but not very much for which she is on furosemide. )  Anesthesia Quick Evaluation  

## 2022-04-04 NOTE — Transfer of Care (Signed)
Immediate Anesthesia Transfer of Care Note  Patient: Rama D Howdyshell  Procedure(s) Performed: ENDOSCOPIC RETROGRADE CHOLANGIOPANCREATOGRAPHY (ERCP) BILIARY STENT PLACEMENT SPHINCTEROTOMY  Patient Location: PACU  Anesthesia Type:General  Level of Consciousness: awake, alert  and oriented  Airway & Oxygen Therapy: Patient Spontanous Breathing  Post-op Assessment: Report given to RN, Post -op Vital signs reviewed and stable and Patient moving all extremities X 4  Post vital signs: Reviewed and stable  Last Vitals:  Vitals Value Taken Time  BP 151/117 04/04/22 1200  Temp    Pulse 124 04/04/22 1204  Resp 16 04/04/22 1204  SpO2 100 % 04/04/22 1204  Vitals shown include unvalidated device data.  Last Pain:  Vitals:   04/04/22 0942  TempSrc: Temporal  PainSc: 0-No pain      Patients Stated Pain Goal: 2 (27/61/47 0929)  Complications: No notable events documented.

## 2022-04-04 NOTE — Interval H&P Note (Signed)
History and Physical Interval Note:  04/04/2022 10:11 AM  Patrick [name]  has presented today for surgery, with the diagnosis of bile leak.  The various methods of treatment have been discussed with the patient and family. After consideration of risks, benefits and other options for treatment, the patient has consented to  Procedure(s): ENDOSCOPIC RETROGRADE CHOLANGIOPANCREATOGRAPHY (ERCP) (N/A) as a surgical intervention.  The patient's history has been reviewed, patient examined, no change in status, stable for surgery.  I have reviewed the patient's chart and labs.  Questions were answered to the patient's satisfaction.     Pricilla Riffle. Fuller Plan

## 2022-04-04 NOTE — Progress Notes (Signed)
PROGRESS NOTE    Patrick [name]  WUJ:[id number] DOB: Apr 26, 1956 DOA: 03/31/2022 PCP: [name], MD   Brief Narrative:  66 year old male with history of blindness, sickle cell/thalassemia followed by sickle cell service, chronic pain syndrome, former smoker, end-stage renal disease on hemodialysis, permanent A-fib not on anticoagulation due to history of GI bleeding from AVMs treated with APC in 07/2021, nonischemic cardiomyopathy, small bowel resection for ischemic colitis in 05/2021, recurrent MRSA gastritis requiring admission in 06/2021 and was found to have Candida, Eliquis subsequently discontinued completely; small bowel obstruction requiring admission in 08/2021 managed conservatively presented with worsening pain, cough, decreased appetite and abdominal pain.  He was found to have low hemoglobin with mildly elevated LFTs, BNP of 3312, high-sensitivity troponin 427 and 378.  CT abdomen and pelvis was negative for PE.  Cardiology was consulted, patient was managed conservatively.  He received blood transfusion.  CT of abdomen had shown cystic mass in the abdomen.  GI was consulted.  He underwent EGD on 04/02/2022 which showed cystic lesion/?  Pancreatic pseudocyst, FNAC performed.  Echo showed EF of 40 to 45%.  Assessment & Plan:   Abdominal pain/upper abdominal cyst?  Pancreatic pseudocyst Elevated LFTs -Underwent EGD and FNAC of the cyst: Results pending.  GI following.  Planning for ERCP with possible stent today. -Currently on oral ciprofloxacin.  Acute on chronic anemia of chronic disease -Has required 2 units packed red cell transfusion so far during this hospitalization.  Hemoglobin 6.7 this morning.  Transfuse 1 more unit of packed red cells.  Monitor H&H.  Elevated troponins Severe mitral and tricuspid regurgitation Chronic systolic heart failure -Patient was evaluated by cardiology who has already signed off.  Outpatient follow-up with cardiology.  Currently not in  any chest pain.  Echo showed EF of 40 to 45%. -Volume being managed by hemodialysis and patient is still on torsemide. -Strict input and output.  Daily weights.  Fluid restriction.  Permanent A-fib -Currently rate controlled.  Not a candidate for anticoagulation  History of hemorrhagic gastritis -Continue Protonix twice a day  Sickle cell anemia/thalassemia -Sickle cell service aware of the patient: They are available to assist if pain needs arise. -Continue hydroxyurea.  Continue current pain medication regimen.  End-stage renal disease on hemodialysis -Nephrology following.  Dialysis as per nephrology schedule  Hyponatremia -Being managed by hemodialysis  Macrocytosis -Questionable cause.  DVT prophylaxis: SCDs Code Status: Full Family Communication: None at bedside Disposition Plan: Status is: Inpatient Remains inpatient appropriate because: Of severe illness.  Needs GI procedure today.    Consultants: GI/nephrology  Procedures: As above  Antimicrobials: None   Subjective: Patient seen and examined at bedside.  Complains of abdominal fullness.  No fever, vomiting, seizures reported.  Objective: Vitals:   04/04/22 0406 04/04/22 0835 04/04/22 0852 04/04/22 0942  BP: 101/69 105/70 105/63 110/77  Pulse: 75 90 89 75  Resp: 18 14 15 10   Temp: 98.1 F (36.7 C) 97.7 F (36.5 C) 98.5 F (36.9 C) 97.9 F (36.6 C)  TempSrc: Oral Oral Oral Temporal  SpO2: 99% 100% 100% 96%  Weight:      Height:        Intake/Output Summary (Last 24 hours) at 04/04/2022 0956 Last data filed at 04/04/2022 0600 Gross per 24 hour  Intake 470 ml  Output 2100 ml  Net -1630 ml   Filed Weights   04/03/22 0538 04/03/22 0743 04/03/22 1224  Weight: 65.6 kg 63.1 kg 62.5 kg    Examination:  General exam:  Appears calm and comfortable.  Currently on room air.  Looks chronically ill and deconditioned. Respiratory system: Bilateral decreased breath sounds at bases with scattered  crackles Cardiovascular system: S1 & S2 heard, Rate controlled Gastrointestinal system: Abdomen is distended, soft and mildly tender.  Normal bowel sounds heard. Extremities: No cyanosis, clubbing; bilateral lower extremity edema present Central nervous system: Alert and oriented.  Slow to respond.  No focal neurological deficits. Moving extremities Skin: No rashes, lesions or ulcers Psychiatry: Flat.  No signs of agitation.   Data Reviewed: I have personally reviewed following labs and imaging studies  CBC: Recent Labs  Lab 03/31/22 1427 03/31/22 1731 04/01/22 1158 04/02/22 0347 04/03/22 0745 04/04/22 0246  WBC 7.5  --  7.8 8.4 9.1 9.4  NEUTROABS 6.2  --  5.9  --  7.8* 6.8  HGB 6.3* 7.8* 6.1* 6.8* 6.1* 6.7*  HCT 16.9* 23.0* 16.4* 18.2* 16.2* 18.7*  MCV 111.2*  --  113.1* 105.8* 105.9* 100.5*  PLT 191  --  174 166 164 527   Basic Metabolic Panel: Recent Labs  Lab 03/31/22 1427 03/31/22 1731 04/01/22 2156 04/02/22 0347 04/03/22 0320 04/04/22 0246  NA 132* 129*  --  132* 131* 137  K 3.5 3.7  --  5.1 4.4 3.9  CL 89*  --   --  90* 91* 96*  CO2 19*  --   --  19* 20* 24  GLUCOSE 84  --   --  78 106* 94  BUN 52*  --   --  63* 80* 34*  CREATININE 7.35*  --   --  10.00* 10.75* 5.62*  CALCIUM 9.1  --   --  8.7* 8.0* 7.8*  PHOS  --   --  6.4* 6.9* 6.9*  --    GFR: Estimated Creatinine Clearance: 11.7 mL/min (A) (by C-G formula based on SCr of 5.62 mg/dL (H)). Liver Function Tests: Recent Labs  Lab 03/31/22 1427 04/02/22 0347 04/03/22 0320  AST 79*  --   --   ALT 28  --   --   ALKPHOS 487*  --   --   BILITOT 2.2*  --   --   PROT 8.1  --   --   ALBUMIN 3.8 2.7* 2.6*   Recent Labs  Lab 03/31/22 1427  LIPASE 93*   No results for input(s): "AMMONIA" in the last 168 hours. Coagulation Profile: Recent Labs  Lab 03/31/22 1427  INR 1.4*   Cardiac Enzymes: No results for input(s): "CKTOTAL", "CKMB", "CKMBINDEX", "TROPONINI" in the last 168 hours. BNP (last 3  results) No results for input(s): "PROBNP" in the last 8760 hours. HbA1C: No results for input(s): "HGBA1C" in the last 72 hours. CBG: No results for input(s): "GLUCAP" in the last 168 hours. Lipid Profile: No results for input(s): "CHOL", "HDL", "LDLCALC", "TRIG", "CHOLHDL", "LDLDIRECT" in the last 72 hours. Thyroid Function Tests: No results for input(s): "TSH", "T4TOTAL", "FREET4", "T3FREE", "THYROIDAB" in the last 72 hours. Anemia Panel: No results for input(s): "VITAMINB12", "FOLATE", "FERRITIN", "TIBC", "IRON", "RETICCTPCT" in the last 72 hours. Sepsis Labs: Recent Labs  Lab 03/31/22 1716 04/01/22 1516  PROCALCITON  --  >150.00  LATICACIDVEN 1.3  --     Recent Results (from the past 240 hour(s))  Resp Panel by RT-PCR (Flu A&B, Covid) Anterior Nasal Swab     Status: None   Collection Time: 03/31/22  2:27 PM   Specimen: Anterior Nasal Swab  Result Value Ref Range Status   SARS Coronavirus 2 by  RT PCR NEGATIVE NEGATIVE Final    Comment: (NOTE) SARS-CoV-2 target nucleic acids are NOT DETECTED.  The SARS-CoV-2 RNA is generally detectable in upper respiratory specimens during the acute phase of infection. The lowest concentration of SARS-CoV-2 viral copies this assay can detect is 138 copies/mL. A negative result does not preclude SARS-Cov-2 infection and should not be used as the sole basis for treatment or other patient management decisions. A negative result may occur with  improper specimen collection/handling, submission of specimen other than nasopharyngeal swab, presence of viral mutation(s) within the areas targeted by this assay, and inadequate number of viral copies(<138 copies/mL). A negative result must be combined with clinical observations, patient history, and epidemiological information. The expected result is Negative.  Fact Sheet for Patients:  EntrepreneurPulse.com.au  Fact Sheet for Healthcare Providers:   IncredibleEmployment.be  This test is no t yet approved or cleared by the [place] FDA and  has been authorized for detection and/or diagnosis of SARS-CoV-2 by FDA under an Emergency Use Authorization (EUA). This EUA will remain  in effect (meaning this test can be used) for the duration of the COVID-19 declaration under Section 564(b)(1) of the Act, 21 U.S.C.section 360bbb-3(b)(1), unless the authorization is terminated  or revoked sooner.       Influenza A by PCR NEGATIVE NEGATIVE Final   Influenza B by PCR NEGATIVE NEGATIVE Final    Comment: (NOTE) The Xpert Xpress SARS-CoV-2/FLU/RSV plus assay is intended as an aid in the diagnosis of influenza from Nasopharyngeal swab specimens and should not be used as a sole basis for treatment. Nasal washings and aspirates are unacceptable for Xpert Xpress SARS-CoV-2/FLU/RSV testing.  Fact Sheet for Patients: EntrepreneurPulse.com.au  Fact Sheet for Healthcare Providers: IncredibleEmployment.be  This test is not yet approved or cleared by the [place] FDA and has been authorized for detection and/or diagnosis of SARS-CoV-2 by FDA under an Emergency Use Authorization (EUA). This EUA will remain in effect (meaning this test can be used) for the duration of the COVID-19 declaration under Section 564(b)(1) of the Act, 21 U.S.C. section 360bbb-3(b)(1), unless the authorization is terminated or revoked.  Performed at [facility], [address] [place], [place] [address]   Aerobic/Anaerobic Culture w Gram Stain (surgical/deep wound)     Status: None (Preliminary result)   Collection Time: 04/02/22  3:30 PM   Specimen: Wound  Result Value Ref Range Status   Specimen Description WOUND  Final   Special Requests ABDOMINAL CYST  Final   Gram Stain   Final    RARE WBC PRESENT, PREDOMINANTLY MONONUCLEAR NO ORGANISMS SEEN    Culture   Final    NO GROWTH <  24 HOURS Performed at [facility] [address], [place], [place] [address]    Report Status PENDING  Incomplete         Radiology Studies: ECHOCARDIOGRAM COMPLETE  Result Date: 04/02/2022    ECHOCARDIOGRAM REPORT   Patient Name:   KELLYN MANSFIELD Date of Exam: 04/02/2022 Medical Rec #:  [id number]           Height:       71.0 in Accession #:    [id number]          Weight:       144.0 lb Date of Birth:  1955-10-23           BSA:          1.834 m Patient Age:    15 years  BP:           93/69 mmHg Patient Gender: M                   HR:           96 bpm. Exam Location:  Inpatient Procedure: 2D Echo, Color Doppler and Cardiac Doppler Indications:    elevated trops  History:        Patient has prior history of Echocardiogram examinations, most                 recent 05/23/2021. CHF, Arrythmias:Atrial Fibrillation,                 Signs/Symptoms:Chest Pain; Risk Factors:Hypertension.  Sonographer:    Melissa Morford RDCS (AE, PE) Referring Phys: [phone] RONDELL A SMITH IMPRESSIONS  1. Left ventricular ejection fraction, by estimation, is 40 to 45%. The left ventricle has mildly decreased function. The left ventricle demonstrates global hypokinesis. The left ventricular internal cavity size was mildly to moderately dilated. Left ventricular diastolic parameters are indeterminate.  2. Right ventricular systolic function is mildly reduced. The right ventricular size is moderately-to-severely enlarged. There is moderately elevated pulmonary artery systolic pressure. The estimated right ventricular systolic pressure is 11.9 mmHg.  3. Left atrial size was severely dilated.  4. Right atrial size was severely dilated.  5. The mitral valve is abnormal. There is at least moderate, mainly posteriorly directed (although there are multiple MR jets) functional/secondary mitral regurgitation likely due to LA and LV dilation.  6. Tricuspid valve regurgitation is severe.  7. The aortic valve is  tricuspid. There is mild calcification of the aortic valve. There is mild thickening of the aortic valve. Aortic valve regurgitation is not visualized. Aortic valve sclerosis/calcification is present, without any evidence of aortic stenosis.  8. The inferior vena cava is dilated in size with <50% respiratory variability, suggesting right atrial pressure of 15 mmHg. Comparison(s): Compared to prior TTE on 05/2021, LVEF remains stable at 40-45%, MR appears slightly less on current study, there continues to be severe TR. FINDINGS  Left Ventricle: Left ventricular ejection fraction, by estimation, is 40 to 45%. The left ventricle has mildly decreased function. The left ventricle demonstrates global hypokinesis. The left ventricular internal cavity size was mildly to moderately dilated. There is no left ventricular hypertrophy. Left ventricular diastolic parameters are indeterminate. Right Ventricle: The right ventricular size is moderately-to-severely enlarged. No increase in right ventricular wall thickness. Right ventricular systolic function is mildly reduced. There is moderately elevated pulmonary artery systolic pressure. The tricuspid regurgitant velocity is 2.87 m/s, and with an assumed right atrial pressure of 15 mmHg, the estimated right ventricular systolic pressure is 14.7 mmHg. Left Atrium: Left atrial size was severely dilated. Right Atrium: Right atrial size was severely dilated. Pericardium: Trivial pericardial effusion is present. The pericardial effusion is circumferential. Mitral Valve: The mitral valve is abnormal. There is mild thickening of the mitral valve leaflet(s). There is at least moderate mainly posteriorly directed (although there are multiple MR jets) functional/secondary mitral regurgitation likely due to LA and LV dilation. mitral valve regurgitation. Tricuspid Valve: The tricuspid valve is normal in structure. Tricuspid valve regurgitation is severe. Aortic Valve: The aortic valve is  tricuspid. There is mild calcification of the aortic valve. There is mild thickening of the aortic valve. Aortic valve regurgitation is not visualized. Aortic valve sclerosis/calcification is present, without any evidence of aortic stenosis. Pulmonic Valve: The pulmonic valve was normal in structure. Pulmonic valve regurgitation  is trivial. Aorta: The aortic root and ascending aorta are structurally normal, with no evidence of dilitation. Venous: The inferior vena cava is dilated in size with less than 50% respiratory variability, suggesting right atrial pressure of 15 mmHg. IAS/Shunts: The atrial septum is grossly normal.  LEFT VENTRICLE PLAX 2D LVIDd:         6.50 cm      Diastology LVIDs:         5.00 cm      LV e' medial:    10.00 cm/s LV PW:         1.10 cm      LV E/e' medial:  10.3 LV IVS:        1.00 cm      LV e' lateral:   14.10 cm/s LVOT diam:     2.20 cm      LV E/e' lateral: 7.3 LV SV:         59 LV SV Index:   32 LVOT Area:     3.80 cm  LV Volumes (MOD) LV vol d, MOD A2C: 138.0 ml LV vol d, MOD A4C: 103.0 ml LV vol s, MOD A2C: 82.8 ml LV vol s, MOD A4C: 71.6 ml LV SV MOD A2C:     55.2 ml LV SV MOD A4C:     103.0 ml LV SV MOD BP:      39.3 ml RIGHT VENTRICLE RV S prime:     11.60 cm/s TAPSE (M-mode): 1.9 cm LEFT ATRIUM              Index        RIGHT ATRIUM           Index LA diam:        6.50 cm  3.54 cm/m   RA Area:     31.40 cm LA Vol (A2C):   124.0 ml 67.63 ml/m  RA Volume:   112.00 ml 61.08 ml/m LA Vol (A4C):   130.0 ml 70.90 ml/m LA Biplane Vol: 133.0 ml 72.54 ml/m  AORTIC VALVE LVOT Vmax:   86.70 cm/s LVOT Vmean:  58.500 cm/s LVOT VTI:    0.156 m  AORTA Ao Root diam: 3.50 cm Ao Asc diam:  3.40 cm MITRAL VALVE                TRICUSPID VALVE MV Area (PHT): 3.50 cm     TR Peak grad:   32.9 mmHg MV Decel Time: 217 msec     TR Vmax:        287.00 cm/s MV E velocity: 103.00 cm/s MV A velocity: 36.10 cm/s   SHUNTS MV E/A ratio:  2.85         Systemic VTI:  0.16 m                              Systemic Diam: 2.20 cm Gwyndolyn Kaufman MD Electronically signed by Gwyndolyn Kaufman MD Signature Date/Time: 04/02/2022/12:16:30 PM    Final         Scheduled Meds:  [MAR Hold] sodium chloride   Intravenous Once   [MAR Hold] sodium chloride   Intravenous Once   [MAR Hold] acetaminophen  650 mg Oral Once   [MAR Hold] Chlorhexidine Gluconate Cloth  6 each Topical Q0600   [MAR Hold] cinacalcet  30 mg Oral Q T,Th,Sa-HD   [MAR Hold] ciprofloxacin  250 mg Oral Daily   [MAR Hold] darbepoetin (ARANESP) injection -  DIALYSIS  200 mcg Intravenous Q Tue-HD   [MAR Hold] diphenhydrAMINE  25 mg Oral Once   [MAR Hold] doxercalciferol  7 mcg Intravenous Q T,Th,Sa-HD   [MAR Hold] hydroxyurea  500 mg Oral Daily   [MAR Hold] pantoprazole  40 mg Oral BID   [MAR Hold] sodium chloride flush  3 mL Intravenous Q12H   [MAR Hold] torsemide  20 mg Oral QID   Continuous Infusions:  [MAR Hold] ferric gluconate (FERRLECIT) IVPB 62.5 mg (04/03/22 0835)          Aline August, MD [facility] 04/04/2022, 9:56 AM

## 2022-04-04 NOTE — Op Note (Addendum)
[facility] Patient Name: Patrick [name] Procedure Date : 04/04/2022 MRN: [id number] Attending MD: Ladene Artist , MD Date of Birth: February 25, 1956 CSN: [id number] Age: 66 Admit Type: Inpatient Procedure:                ERCP Indications:              Suspected bile leak, Elevated liver enzymes,                            Abdominal fluid collection Providers:                Pricilla Riffle. Fuller Plan, MD, Doristine Johns, RN, [facility] Technician, Technician, Brien Mates, RNFA Referring MD:             [facility] Medicines:                General Anesthesia Complications:            No immediate complications. Estimated Blood Loss:     Estimated blood loss: none. Procedure:                Pre-Anesthesia Assessment:                           - Prior to the procedure, a History and Physical                            was performed, and patient medications and                            allergies were reviewed. The patient's tolerance of                            previous anesthesia was also reviewed. The risks                            and benefits of the procedure and the sedation                            options and risks were discussed with the patient.                            All questions were answered, and informed consent                            was obtained. Prior Anticoagulants: The patient has                            taken no previous anticoagulant or antiplatelet                            agents. ASA Grade Assessment: III - A patient with  severe systemic disease. After reviewing the risks                            and benefits, the patient was deemed in                            satisfactory condition to undergo the procedure.                           After obtaining informed consent, the scope was                            passed under direct vision. Throughout the                             procedure, the patient's blood pressure, pulse, and                            oxygen saturations were monitored continuously. The                            [id number]-[id number] ([id number] Olympus duodenoscope was                            introduced through the mouth, and used to inject                            contrast into and used to cannulate the bile duct.                            The ERCP was somewhat difficult due to abnormal                            anatomy. The patient tolerated the procedure well. Scope In: Scope Out: Findings:      A scout film of the abdomen was obtained. Surgical clips, consistent       with a previous cholecystectomy, were seen in the area of the right       upper quadrant of the abdomen. The scope was advanced to the major       papilla in the descending duodenum. Marked extrinsic compression of the       stomach noted. One gastric antrum ulcer and scattered gastric erosions       were noted. Limited examination of the pharynx, larynx and associated       structures, and upper GI tract was otherwise normal. The major papilla       appeared to have a prior small sphincterotomy. Positioning for       cannulation was difficult with the only approach with the scope in the       long position. Intially challening to locate the small opening of the       prior sphinctertomy and when located the straight Roadrunner wire was       passed into the biliary tree. The short-nosed traction sphincterotome       was passed over the guidewire and the bile duct was then  deeply       cannulated. Contrast was injected. I personally interpreted the bile       duct images. There was appropriate flow of contrast through the ducts. A       cholecystectomy had been performed. Otherwise normal appearing post       cholecystectomy cholangiogram. No bile leak was noted however given       concern for a bile leak the decision was made to place a biliary stent.       A 4 mm biliary  sphincterotomy extension was performed with a traction       (standard) sphincterotome using ERBE electrocautery to provide better       access for stent placement. There was no post-sphincterotomy bleeding.       One 8.5 Fr by 5 cm plastic stent with a single external flap and a       single internal flap was placed 4 cm into the common bile duct. Bile       flowed through the stent. The stent was in good position. The PD was not       cannulated or injected by intention. Impression:               - Apparent prior sphincterotomy.                           - Prior cholecystectomy.                           - Normal appearing post cholecystectomy                            cholangiogram.                           - Sphinctertomy extended.                           - Biliary stent placed.                           - Gastric antrum ulcer.                           - Diffuse erosive gastritis.                           - Marked extrinsic gastric compression. Recommendation:           - Return patient to hospital ward for ongoing care.                           - Observe patient's clinical course following                            today's ERCP with therapeutic intervention.                           - Erosive gastritis is the suspected source of heme  positive stool, anemia.                           - Review options for cyst mgmt with Dr. Rush Landmark. Procedure Code(s):        --- Professional ---                           [phone], Endoscopic retrograde                            cholangiopancreatography (ERCP); with placement of                            endoscopic stent into biliary or pancreatic duct,                            including pre- and post-dilation and guide wire                            passage, when performed, including sphincterotomy,                            when performed, each stent Diagnosis Code(s):        --- Professional ---                            Z90.49, Acquired absence of other specified parts                            of digestive tract CPT copyright 2019 [employer]. All rights reserved. The codes documented in this report are preliminary and upon coder review may  be revised to meet current compliance requirements. Ladene Artist, MD 04/04/2022 11:54:35 AM This report has been signed electronically. Number of Addenda: 0

## 2022-04-04 NOTE — Progress Notes (Signed)
Pharmacist Heart Failure Core Measure Documentation  Assessment: Patrick [name] has an EF documented as 40-45% on 04/02/22 by ECHO.  Rationale: Heart failure patients with left ventricular systolic dysfunction (LVSD) and an EF < 40% should be prescribed an angiotensin converting enzyme inhibitor (ACEI) or angiotensin receptor blocker (ARB) at discharge unless a contraindication is documented in the medical record.  This patient is not currently on an ACEI or ARB for HF.  This note is being placed in the record in order to provide documentation that a contraindication to the use of these agents is present for this encounter.  ACE Inhibitor or Angiotensin Receptor Blocker is contraindicated (specify all that apply)  []   ACEI allergy AND ARB allergy []   Angioedema []   Moderate or severe aortic stenosis []   Hyperkalemia []   Hypotension []   Renal artery stenosis [x]   Worsening renal function, preexisting renal disease or dysfunction  Dimple Nanas, PharmD, BCPS 04/04/2022 3:39 PM

## 2022-04-04 NOTE — Progress Notes (Signed)
[name] Progress Note   Subjective:   Pt was sitting comfortably in bed when seen. Had recently returned from getting procedure done. Thinks things went well. Is not having any N/V. Does not have much of an appetite. Understands he needs to be in the hospital for a few more days and will have HD tomorrow. Is occasionally bringing up thin phlegm with cough but reports he is breathing fine. Denies HA, dizziness, CP, SOB.   Objective Vitals:   04/04/22 0942 04/04/22 1200 04/04/22 1215 04/04/22 1230  BP: 110/77 (!) 151/117 99/61 119/62  Pulse: 75 (!) 124 100 100  Resp: 10 16 18 20   Temp: 97.9 F (36.6 C) (!) 97.4 F (36.3 C)  97.6 F (36.4 C)  TempSrc: Temporal     SpO2: 96% 97% 92% 98%  Weight:      Height:       Physical Exam General: Chronically ill appearing male in NAD Heart:RRR, no murmurs, rubs or gallops Lungs: Normal work of breathing. Bilateral lower lung fields with scattered crackles.  Abdomen: NTND Extremities:No LE edema Dialysis Access: RU AVF +b/t   Filed Weights   04/03/22 0538 04/03/22 0743 04/03/22 1224  Weight: 65.6 kg 63.1 kg 62.5 kg    Intake/Output Summary (Last 24 hours) at 04/04/2022 1431 Last data filed at 04/04/2022 1200 Gross per 24 hour  Intake 955 ml  Output 0 ml  Net 955 ml    Additional Objective Labs: Basic Metabolic Panel: Recent Labs  Lab 04/01/22 2156 04/02/22 0347 04/03/22 0320 04/04/22 0246  NA  --  132* 131* 137  K  --  5.1 4.4 3.9  CL  --  90* 91* 96*  CO2  --  19* 20* 24  GLUCOSE  --  78 106* 94  BUN  --  63* 80* 34*  CREATININE  --  10.00* 10.75* 5.62*  CALCIUM  --  8.7* 8.0* 7.8*  PHOS 6.4* 6.9* 6.9*  --    Liver Function Tests: Recent Labs  Lab 03/31/22 1427 04/02/22 0347 04/03/22 0320  AST 79*  --   --   ALT 28  --   --   ALKPHOS 487*  --   --   BILITOT 2.2*  --   --   PROT 8.1  --   --   ALBUMIN 3.8 2.7* 2.6*   Recent Labs  Lab 03/31/22 1427  LIPASE 93*   CBC: Recent Labs   Lab 03/31/22 1427 03/31/22 1731 04/01/22 1158 04/02/22 0347 04/03/22 0745 04/04/22 0246  WBC 7.5  --  7.8 8.4 9.1 9.4  NEUTROABS 6.2  --  5.9  --  7.8* 6.8  HGB 6.3*   < > 6.1* 6.8* 6.1* 6.7*  HCT 16.9*   < > 16.4* 18.2* 16.2* 18.7*  MCV 111.2*  --  113.1* 105.8* 105.9* 100.5*  PLT 191  --  174 166 164 170   < > = values in this interval not displayed.   Blood Culture    Component Value Date/Time   SDES WOUND 04/02/2022 1530   SPECREQUEST ABDOMINAL CYST 04/02/2022 1530   CULT  04/02/2022 1530    NO GROWTH 2 DAYS Performed at [facility], [place] [address], [place], [place] [address]    REPTSTATUS PENDING 04/02/2022 1530   Lab Results  Component Value Date   INR 1.4 (H) 03/31/2022   INR 1.4 (H) 08/06/2021   INR 1.9 (H) 06/27/2021   Studies/Results: DG ERCP  Result Date: 04/04/2022  CLINICAL DATA:  ERCP EXAM: ERCP TECHNIQUE: Multiple spot images obtained with the fluoroscopic device and submitted for interpretation post-procedure. FLUOROSCOPY: Refer to separate report COMPARISON:  None Available. FINDINGS: A total of 16 fluoroscopic spot images taken during ERCP are submitted for review. Images demonstrate scope overlying the upper abdomen with wire catheterization of the extrahepatic biliary tree. Contrast injection opacifies both the extrahepatic and intrahepatic biliary ducts. The patient is status post cholecystectomy with a cystic duct stump. Surgical clips are present over the gallbladder fossa. Intrahepatic biliary ducts are normal in caliber. There is slight prominence of the common bile duct. A plastic biliary stent is placed, and the scope was withdrawn. IMPRESSION: ERCP images as described. Refer to separate procedure report for full details. These images were submitted for radiologic interpretation only. Please see the procedural report for the amount of contrast and the fluoroscopy time utilized. Electronically Signed   By: Albin Felling M.D.   On: 04/04/2022  13:10    Medications:  ferric gluconate (FERRLECIT) IVPB 62.5 mg (04/03/22 0835)    (feeding supplement) PROSource Plus  30 mL Oral TID BM   sodium chloride   Intravenous Once   sodium chloride   Intravenous Once   acetaminophen  650 mg Oral Once   Chlorhexidine Gluconate Cloth  6 each Topical Q0600   cinacalcet  30 mg Oral Q T,Th,Sa-HD   darbepoetin (ARANESP) injection - DIALYSIS  200 mcg Intravenous Q Tue-HD   diclofenac  100 mg Rectal Once   diphenhydrAMINE  25 mg Oral Once   doxercalciferol  7 mcg Intravenous Q T,Th,Sa-HD   feeding supplement  1 Container Oral TID BM   hydroxyurea  500 mg Oral Daily   pantoprazole  40 mg Oral BID   sodium chloride flush  3 mL Intravenous Q12H   torsemide  20 mg Oral QID    Dialysis Orders: SW TTS 4 h 15 min  400/500  64 kg  2/2.5 bath  P2 No Hep RUA AVF Mircera 225 q2wks, Due 10/17 Venofer 50 mg weekly Hectorol 7 ug iv tiw tts Sensipar 30 mg tiw po tts    Assessment/Plan: 1. Abd pain / N/V - CT abd showed large 25 cm cyst-like structure which may be causing pain. No N/V today. ERCP and stent with GI today. Procedure went well per patient. 2. ESRD - on HD TTS. Last HD 04/03/2022. Plan for HD tomorrow. Does not appear volume overloaded on exam. UF as tolerated.  3. Sickle Cell Pain Crisis - per primary team 4. Elevated troponin - Per cardiology not ACS. Likely 2/2 ischemia d/t severe anemia. 5. Atrial fib - per primary team/cards 6. BP/Volume - CXR and CT chest do not show signs of fluid overload. Does not appear volume overloaded on exam. UF as tolerated w/HD.  7. Anemia of CKD/sickle cell- Hgb 6.7 s/p 1 unit pRBC 10/17, total 3units during admission. Transfusion threshold 6 per PMD. On aranesp 200 qTues, IV iron ordered 8. Secondary hyperparathyroidism - Ca 7.8, last Phos 6.9. Resume binders when diet advanced 9. Elevated LFT's - GI consulting 6. Nutrition - Currently clear liquid. Renal diet w/fluid restriction when advanced. Alb 2.6 -  add protein supplements when diet advanced.   Patrick [name], [facility] PA-S2  Progress note written with assistance by PA student.  PA performed own history and physical exam.  Plan discussed with student and documented.   Jen Mow, PA-C [facility] 04/04/2022,2:31 PM  LOS: 3 days

## 2022-04-04 NOTE — Anesthesia Procedure Notes (Signed)
Procedure Name: Intubation Date/Time: 04/04/2022 10:27 AM  Performed by: Kyung Rudd, CRNAPre-anesthesia Checklist: Patient identified, Emergency Drugs available, Suction available and Patient being monitored Patient Re-evaluated:Patient Re-evaluated prior to induction Oxygen Delivery Method: Circle system utilized Preoxygenation: Pre-oxygenation with 100% oxygen Induction Type: IV induction Ventilation: Mask ventilation without difficulty Laryngoscope Size: Mac and 4 Grade View: Grade III Tube type: Oral Tube size: 7.5 mm Number of attempts: 1 Airway Equipment and Method: Stylet Placement Confirmation: positive ETCO2 and breath sounds checked- equal and bilateral Secured at: 21 cm Tube secured with: Tape Dental Injury: Teeth and Oropharynx as per pre-operative assessment

## 2022-04-05 ENCOUNTER — Encounter (HOSPITAL_COMMUNITY): Payer: Self-pay | Admitting: Internal Medicine

## 2022-04-05 DIAGNOSIS — D5700 Hb-SS disease with crisis, unspecified: Secondary | ICD-10-CM | POA: Diagnosis not present

## 2022-04-05 DIAGNOSIS — R188 Other ascites: Secondary | ICD-10-CM | POA: Diagnosis not present

## 2022-04-05 DIAGNOSIS — R195 Other fecal abnormalities: Secondary | ICD-10-CM

## 2022-04-05 DIAGNOSIS — N186 End stage renal disease: Secondary | ICD-10-CM | POA: Diagnosis not present

## 2022-04-05 DIAGNOSIS — I5022 Chronic systolic (congestive) heart failure: Secondary | ICD-10-CM | POA: Diagnosis not present

## 2022-04-05 LAB — TYPE AND SCREEN
ABO/RH(D): AB POS
Antibody Screen: NEGATIVE
Unit division: 0
Unit division: 0
Unit division: 0

## 2022-04-05 LAB — RENAL FUNCTION PANEL
Albumin: 2.8 g/dL — ABNORMAL LOW (ref 3.5–5.0)
Anion gap: 14 (ref 5–15)
BUN: 51 mg/dL — ABNORMAL HIGH (ref 8–23)
CO2: 24 mmol/L (ref 22–32)
Calcium: 8 mg/dL — ABNORMAL LOW (ref 8.9–10.3)
Chloride: 97 mmol/L — ABNORMAL LOW (ref 98–111)
Creatinine, Ser: 7.36 mg/dL — ABNORMAL HIGH (ref 0.61–1.24)
GFR, Estimated: 8 mL/min — ABNORMAL LOW (ref >60)
Glucose, Bld: 105 mg/dL — ABNORMAL HIGH (ref 70–99)
Phosphorus: 4.4 mg/dL (ref 2.5–4.6)
Potassium: 3.8 mmol/L (ref 3.5–5.1)
Sodium: 135 mmol/L (ref 135–145)

## 2022-04-05 LAB — BPAM RBC
Blood Product Expiration Date: 202310242359
Blood Product Expiration Date: 202311012359
Blood Product Expiration Date: 202311022359
ISSUE DATE / TIME: 202310151757
ISSUE DATE / TIME: 202310171119
ISSUE DATE / TIME: 202310180824
Unit Type and Rh: 6200
Unit Type and Rh: 6200
Unit Type and Rh: 6200

## 2022-04-05 LAB — MISC LABCORP TEST (SEND OUT): Labcorp test code: 829034

## 2022-04-05 LAB — CBC
HCT: 22 % — ABNORMAL LOW (ref 39.0–52.0)
Hemoglobin: 8 g/dL — ABNORMAL LOW (ref 13.0–17.0)
MCH: 34.8 pg — ABNORMAL HIGH (ref 26.0–34.0)
MCHC: 36.4 g/dL — ABNORMAL HIGH (ref 30.0–36.0)
MCV: 95.7 fL (ref 80.0–100.0)
Platelets: 171 10*3/uL (ref 150–400)
RBC: 2.3 MIL/uL — ABNORMAL LOW (ref 4.22–5.81)
WBC: 9.8 10*3/uL (ref 4.0–10.5)
nRBC: 14.3 % — ABNORMAL HIGH (ref 0.0–0.2)

## 2022-04-05 LAB — CYTOLOGY - NON PAP

## 2022-04-05 MED ORDER — SODIUM CHLORIDE 0.9 % IV BOLUS
250.0000 mL | Freq: Once | INTRAVENOUS | Status: AC
  Administered 2022-04-06: 250 mL via INTRAVENOUS

## 2022-04-05 MED ORDER — PENTAFLUOROPROP-TETRAFLUOROETH EX AERO: 1.0000 | INHALATION_SPRAY | CUTANEOUS | Status: DC | PRN

## 2022-04-05 MED ORDER — LIDOCAINE-PRILOCAINE 2.5-2.5 % EX CREA: 1.0000 | TOPICAL_CREAM | CUTANEOUS | Status: DC | PRN

## 2022-04-05 MED ORDER — LIDOCAINE HCL (PF) 1 % IJ SOLN: 5.0000 mL | INTRAMUSCULAR | Status: DC | PRN

## 2022-04-05 NOTE — Progress Notes (Signed)
IR was requested for aspiration of the upper intraabdominal cyst.    The procedure was tentatively scheduled for today, however, IR was not able to accommodate the procedure today due to scheduled outpatient procedures/urgent cases/patient in HD in the morning.    RN/MD notified, made npo at midnight.   The procedure is tentatively scheduled for tomorrow pending IR schedule.  Please call IR for questions and concerns.    Armando Gang Gabrien Mentink PA-C 04/05/2022 2:57 PM

## 2022-04-05 NOTE — Procedures (Signed)
Patient was seen on dialysis and the procedure was supervised.  BFR 400  Via AVF BP is  115/85.   Patient appears to be tolerating treatment well  Patrick [name] 04/05/2022

## 2022-04-05 NOTE — Progress Notes (Signed)
Pt receives out-pt HD at [facility] on TTS. Pt has a 5:45 am chair time. Will assist as needed.   Melven Sartorius Renal Navigator [phone]

## 2022-04-05 NOTE — Progress Notes (Signed)
PROGRESS NOTE    Patrick [name]  EPP:[id number] DOB: 06/29/1955 DOA: 03/31/2022 PCP: [name], MD   Brief Narrative:  66 year old male with history of blindness, sickle cell/thalassemia followed by sickle cell service, chronic pain syndrome, former smoker, end-stage renal disease on hemodialysis, permanent A-fib not on anticoagulation due to history of GI bleeding from AVMs treated with APC in 07/2021, nonischemic cardiomyopathy, small bowel resection for ischemic colitis in 05/2021, recurrent MRSA gastritis requiring admission in 06/2021 and was found to have Candida, Eliquis subsequently discontinued completely; small bowel obstruction requiring admission in 08/2021 managed conservatively presented with worsening pain, cough, decreased appetite and abdominal pain.  He was found to have low hemoglobin with mildly elevated LFTs, BNP of 3312, high-sensitivity troponin 427 and 378.  CT abdomen and pelvis was negative for PE.  Cardiology was consulted, patient was managed conservatively.  He received blood transfusion.  CT of abdomen had shown cystic mass in the abdomen.  GI was consulted.  He underwent EGD on 04/02/2022 which showed cystic lesion/?  Pancreatic pseudocyst, FNAC performed.  Echo showed EF of 40 to 45%.  He underwent ERCP on 04/04/2022.  Assessment & Plan:   Abdominal pain/upper abdominal cyst?  Pancreatic pseudocyst Elevated LFTs -Underwent EGD and FNAC of the cyst: GI following.  Status post ERCP and biliary stent placed on 04/04/2022 by GI. -Currently on oral ciprofloxacin.  Acute on chronic anemia of chronic disease -Has required 3 units packed red cell transfusion so far during this hospitalization, most recently on 04/04/2022 for hemoglobin of 6.7.  Hemoglobin 8 today.  Monitor H&H.  Elevated troponins Severe mitral and tricuspid regurgitation Chronic systolic heart failure -Patient was evaluated by cardiology who has already signed off.  Outpatient follow-up  with cardiology.  Currently not in any chest pain.  Echo showed EF of 40 to 45%. -Volume being managed by hemodialysis and patient is still on torsemide. -Strict input and output.  Daily weights.  Fluid restriction.  Permanent A-fib -Currently rate controlled.  Not a candidate for anticoagulation  History of hemorrhagic gastritis -Continue Protonix twice a day  Sickle cell anemia/thalassemia -Sickle cell service aware of the patient: They are available to assist if pain needs arise. -Continue hydroxyurea.  Continue current pain medication regimen.  End-stage renal disease on hemodialysis -Nephrology following.  Dialysis as per nephrology schedule  Hyponatremia -Improved.  Macrocytosis -Questionable cause.  DVT prophylaxis: SCDs Code Status: Full Family Communication: None at bedside Disposition Plan: Status is: Inpatient Remains inpatient appropriate because: Of severity of illness.     Consultants: GI/nephrology  Procedures: As above ERCP on 04/04/2022 Impression:               - Apparent prior sphincterotomy.                           - Prior cholecystectomy.                           - Normal appearing post cholecystectomy                            cholangiogram.                           - Sphinctertomy extended.                           -  Biliary stent placed.                           - Gastric antrum ulcer.                           - Diffuse erosive gastritis.                           - Marked extrinsic gastric compression. Recommendation:           - Return patient to hospital ward for ongoing care.                           - Observe patient's clinical course following                            today's ERCP with therapeutic intervention.                           - Erosive gastritis is the suspected source of heme                            positive stool, anemia.  Antimicrobials: None   Subjective: Patient seen and examined at bedside undergoing  hemodialysis.  Still complains of abdominal tightness and intermittent pain.  No chest pain, worsening shortness of breath or fever reported.  Objective: Vitals:   04/05/22 0830 04/05/22 0900 04/05/22 0930 04/05/22 1000  BP: 105/70 110/79 115/85 118/77  Pulse: (!) 106 (!) 105 99 (!) 124  Resp: 20 13 11 12   Temp:      TempSrc:      SpO2: 100% 100% 100% 100%  Weight:      Height:        Intake/Output Summary (Last 24 hours) at 04/05/2022 1015 Last data filed at 04/05/2022 0900 Gross per 24 hour  Intake 850 ml  Output 0 ml  Net 850 ml    Filed Weights   04/03/22 0743 04/03/22 1224 04/05/22 0806  Weight: 63.1 kg 62.5 kg 62.3 kg    Examination:  General: On room air.  No distress.  Looks chronically ill and deconditioned. ENT/neck: No thyromegaly.  JVD is not elevated  respiratory: Decreased breath sounds at bases bilaterally with some crackles; no wheezing  CVS: S1-S2 heard, intermittently tachycardic  abdominal: Soft, mildly tender, still distended; no organomegaly, normal bowel sounds are heard Extremities: Trace lower extremity edema; no cyanosis  CNS: Awake and alert.  Slow to respond.  No focal neurologic deficit.  Moves extremities Lymph: No obvious lymphadenopathy Skin: No obvious ecchymosis/lesions  psych: Showing no signs of agitation.  Affect is still flat. musculoskeletal: No obvious joint swelling/deformity    Data Reviewed: I have personally reviewed following labs and imaging studies  CBC: Recent Labs  Lab 03/31/22 1427 03/31/22 1731 04/01/22 1158 04/02/22 0347 04/03/22 0745 04/04/22 0246 04/04/22 1454 04/05/22 0836  WBC 7.5  --  7.8 8.4 9.1 9.4  --  9.8  NEUTROABS 6.2  --  5.9  --  7.8* 6.8  --   --   HGB 6.3*   < > 6.1* 6.8* 6.1* 6.7* 8.5* 8.0*  HCT 16.9*   < > 16.4* 18.2* 16.2* 18.7* 23.7* 22.0*  MCV  111.2*  --  113.1* 105.8* 105.9* 100.5*  --  95.7  PLT 191  --  174 166 164 170  --  171   < > = values in this interval not displayed.     Basic Metabolic Panel: Recent Labs  Lab 03/31/22 1427 03/31/22 1731 04/01/22 2156 04/02/22 0347 04/03/22 0320 04/04/22 0246 04/05/22 0836  NA 132* 129*  --  132* 131* 137 135  K 3.5 3.7  --  5.1 4.4 3.9 3.8  CL 89*  --   --  90* 91* 96* 97*  CO2 19*  --   --  19* 20* 24 24  GLUCOSE 84  --   --  78 106* 94 105*  BUN 52*  --   --  63* 80* 34* 51*  CREATININE 7.35*  --   --  10.00* 10.75* 5.62* 7.36*  CALCIUM 9.1  --   --  8.7* 8.0* 7.8* 8.0*  PHOS  --   --  6.4* 6.9* 6.9*  --  4.4    GFR: Estimated Creatinine Clearance: 8.9 mL/min (A) (by C-G formula based on SCr of 7.36 mg/dL (H)). Liver Function Tests: Recent Labs  Lab 03/31/22 1427 04/02/22 0347 04/03/22 0320 04/05/22 0836  AST 79*  --   --   --   ALT 28  --   --   --   ALKPHOS 487*  --   --   --   BILITOT 2.2*  --   --   --   PROT 8.1  --   --   --   ALBUMIN 3.8 2.7* 2.6* 2.8*    Recent Labs  Lab 03/31/22 1427  LIPASE 93*    No results for input(s): "AMMONIA" in the last 168 hours. Coagulation Profile: Recent Labs  Lab 03/31/22 1427  INR 1.4*    Cardiac Enzymes: No results for input(s): "CKTOTAL", "CKMB", "CKMBINDEX", "TROPONINI" in the last 168 hours. BNP (last 3 results) No results for input(s): "PROBNP" in the last 8760 hours. HbA1C: No results for input(s): "HGBA1C" in the last 72 hours. CBG: No results for input(s): "GLUCAP" in the last 168 hours. Lipid Profile: No results for input(s): "CHOL", "HDL", "LDLCALC", "TRIG", "CHOLHDL", "LDLDIRECT" in the last 72 hours. Thyroid Function Tests: No results for input(s): "TSH", "T4TOTAL", "FREET4", "T3FREE", "THYROIDAB" in the last 72 hours. Anemia Panel: No results for input(s): "VITAMINB12", "FOLATE", "FERRITIN", "TIBC", "IRON", "RETICCTPCT" in the last 72 hours. Sepsis Labs: Recent Labs  Lab 03/31/22 1716 04/01/22 1516  PROCALCITON  --  >150.00  LATICACIDVEN 1.3  --      Recent Results (from the past 240 hour(s))  Resp Panel by  RT-PCR (Flu A&B, Covid) Anterior Nasal Swab     Status: None   Collection Time: 03/31/22  2:27 PM   Specimen: Anterior Nasal Swab  Result Value Ref Range Status   SARS Coronavirus 2 by RT PCR NEGATIVE NEGATIVE Final    Comment: (NOTE) SARS-CoV-2 target nucleic acids are NOT DETECTED.  The SARS-CoV-2 RNA is generally detectable in upper respiratory specimens during the acute phase of infection. The lowest concentration of SARS-CoV-2 viral copies this assay can detect is 138 copies/mL. A negative result does not preclude SARS-Cov-2 infection and should not be used as the sole basis for treatment or other patient management decisions. A negative result may occur with  improper specimen collection/handling, submission of specimen other than nasopharyngeal swab, presence of viral mutation(s) within the areas targeted by this assay, and inadequate number of  viral copies(<138 copies/mL). A negative result must be combined with clinical observations, patient history, and epidemiological information. The expected result is Negative.  Fact Sheet for Patients:  EntrepreneurPulse.com.au  Fact Sheet for Healthcare Providers:  IncredibleEmployment.be  This test is no t yet approved or cleared by the [place] FDA and  has been authorized for detection and/or diagnosis of SARS-CoV-2 by FDA under an Emergency Use Authorization (EUA). This EUA will remain  in effect (meaning this test can be used) for the duration of the COVID-19 declaration under Section 564(b)(1) of the Act, 21 U.S.C.section 360bbb-3(b)(1), unless the authorization is terminated  or revoked sooner.       Influenza A by PCR NEGATIVE NEGATIVE Final   Influenza B by PCR NEGATIVE NEGATIVE Final    Comment: (NOTE) The Xpert Xpress SARS-CoV-2/FLU/RSV plus assay is intended as an aid in the diagnosis of influenza from Nasopharyngeal swab specimens and should not be used as a sole basis for  treatment. Nasal washings and aspirates are unacceptable for Xpert Xpress SARS-CoV-2/FLU/RSV testing.  Fact Sheet for Patients: EntrepreneurPulse.com.au  Fact Sheet for Healthcare Providers: IncredibleEmployment.be  This test is not yet approved or cleared by the [place] FDA and has been authorized for detection and/or diagnosis of SARS-CoV-2 by FDA under an Emergency Use Authorization (EUA). This EUA will remain in effect (meaning this test can be used) for the duration of the COVID-19 declaration under Section 564(b)(1) of the Act, 21 U.S.C. section 360bbb-3(b)(1), unless the authorization is terminated or revoked.  Performed at [facility], [address], [place], [place] [address]   Aerobic/Anaerobic Culture w Gram Stain (surgical/deep wound)     Status: None (Preliminary result)   Collection Time: 04/02/22  3:30 PM   Specimen: Wound  Result Value Ref Range Status   Specimen Description WOUND  Final   Special Requests ABDOMINAL CYST  Final   Gram Stain   Final    RARE WBC PRESENT, PREDOMINANTLY MONONUCLEAR NO ORGANISMS SEEN    Culture   Final    NO GROWTH 3 DAYS NO ANAEROBES ISOLATED; CULTURE IN PROGRESS FOR 5 DAYS Performed at [facility] [address] [place], [place] [address]    Report Status PENDING  Incomplete         Radiology Studies: DG ERCP  Result Date: 04/04/2022 CLINICAL DATA:  ERCP EXAM: ERCP TECHNIQUE: Multiple spot images obtained with the fluoroscopic device and submitted for interpretation post-procedure. FLUOROSCOPY: Refer to separate report COMPARISON:  None Available. FINDINGS: A total of 16 fluoroscopic spot images taken during ERCP are submitted for review. Images demonstrate scope overlying the upper abdomen with wire catheterization of the extrahepatic biliary tree. Contrast injection opacifies both the extrahepatic and intrahepatic biliary ducts. The patient is status  post cholecystectomy with a cystic duct stump. Surgical clips are present over the gallbladder fossa. Intrahepatic biliary ducts are normal in caliber. There is slight prominence of the common bile duct. A plastic biliary stent is placed, and the scope was withdrawn. IMPRESSION: ERCP images as described. Refer to separate procedure report for full details. These images were submitted for radiologic interpretation only. Please see the procedural report for the amount of contrast and the fluoroscopy time utilized. Electronically Signed   By: Albin Felling M.D.   On: 04/04/2022 13:10        Scheduled Meds:  (feeding supplement) PROSource Plus  30 mL Oral TID BM   sodium chloride   Intravenous Once   sodium chloride  Intravenous Once   cinacalcet  30 mg Oral Q T,Th,Sa-HD   darbepoetin (ARANESP) injection - DIALYSIS  200 mcg Intravenous Q Tue-HD   diclofenac  100 mg Rectal Once   diphenhydrAMINE  25 mg Oral Once   doxercalciferol  7 mcg Intravenous Q T,Th,Sa-HD   feeding supplement  1 Container Oral TID BM   hydroxyurea  500 mg Oral Daily   pantoprazole  40 mg Oral BID   sodium chloride flush  3 mL Intravenous Q12H   torsemide  20 mg Oral QID   Continuous Infusions:  ferric gluconate (FERRLECIT) IVPB 62.5 mg (04/03/22 0835)          Aline August, MD [facility] 04/05/2022, 10:15 AM

## 2022-04-05 NOTE — Progress Notes (Signed)
[facility] Progress Note   Subjective:   Seen in HD -  no particular complaints  Objective Vitals:   04/05/22 0806 04/05/22 0828 04/05/22 0830 04/05/22 0900  BP: 102/69 105/70 105/70 110/79  Pulse: (!) 108 (!) 116 (!) 106 (!) 105  Resp: 14 17 20 13   Temp: 98.3 F (36.8 C)     TempSrc: Oral     SpO2: 93% 100% 100% 100%  Weight: 62.3 kg     Height:       Physical Exam General: Chronically ill appearing male in NAD Heart:RRR, no murmurs, rubs or gallops Lungs: Normal work of breathing. Bilateral lower lung fields with scattered crackles.  Abdomen: NTND Extremities:No LE edema Dialysis Access: RU AVF +b/t   Filed Weights   04/03/22 0743 04/03/22 1224 04/05/22 0806  Weight: 63.1 kg 62.5 kg 62.3 kg    Intake/Output Summary (Last 24 hours) at 04/05/2022 0932 Last data filed at 04/05/2022 0900 Gross per 24 hour  Intake 850 ml  Output 0 ml  Net 850 ml    Additional Objective Labs: Basic Metabolic Panel: Recent Labs  Lab 04/02/22 0347 04/03/22 0320 04/04/22 0246 04/05/22 0836  NA 132* 131* 137 135  K 5.1 4.4 3.9 3.8  CL 90* 91* 96* 97*  CO2 19* 20* 24 24  GLUCOSE 78 106* 94 105*  BUN 63* 80* 34* 51*  CREATININE 10.00* 10.75* 5.62* 7.36*  CALCIUM 8.7* 8.0* 7.8* 8.0*  PHOS 6.9* 6.9*  --  4.4   Liver Function Tests: Recent Labs  Lab 03/31/22 1427 04/02/22 0347 04/03/22 0320 04/05/22 0836  AST 79*  --   --   --   ALT 28  --   --   --   ALKPHOS 487*  --   --   --   BILITOT 2.2*  --   --   --   PROT 8.1  --   --   --   ALBUMIN 3.8 2.7* 2.6* 2.8*   Recent Labs  Lab 03/31/22 1427  LIPASE 93*   CBC: Recent Labs  Lab 04/01/22 1158 04/02/22 0347 04/03/22 0745 04/04/22 0246 04/04/22 1454 04/05/22 0836  WBC 7.8 8.4 9.1 9.4  --  9.8  NEUTROABS 5.9  --  7.8* 6.8  --   --   HGB 6.1* 6.8* 6.1* 6.7* 8.5* 8.0*  HCT 16.4* 18.2* 16.2* 18.7* 23.7* 22.0*  MCV 113.1* 105.8* 105.9* 100.5*  --  95.7  PLT 174 166 164 170  --  171   Blood  Culture    Component Value Date/Time   SDES WOUND 04/02/2022 1530   SPECREQUEST ABDOMINAL CYST 04/02/2022 1530   CULT  04/02/2022 1530    NO GROWTH 3 DAYS NO ANAEROBES ISOLATED; CULTURE IN PROGRESS FOR 5 DAYS Performed at [facility], [place] [address] [place], [place] [address]    REPTSTATUS PENDING 04/02/2022 1530   Lab Results  Component Value Date   INR 1.4 (H) 03/31/2022   INR 1.4 (H) 08/06/2021   INR 1.9 (H) 06/27/2021   Studies/Results: DG ERCP  Result Date: 04/04/2022 CLINICAL DATA:  ERCP EXAM: ERCP TECHNIQUE: Multiple spot images obtained with the fluoroscopic device and submitted for interpretation post-procedure. FLUOROSCOPY: Refer to separate report COMPARISON:  None Available. FINDINGS: A total of 16 fluoroscopic spot images taken during ERCP are submitted for review. Images demonstrate scope overlying the upper abdomen with wire catheterization of the extrahepatic biliary tree. Contrast injection opacifies both the extrahepatic and intrahepatic biliary ducts.  The patient is status post cholecystectomy with a cystic duct stump. Surgical clips are present over the gallbladder fossa. Intrahepatic biliary ducts are normal in caliber. There is slight prominence of the common bile duct. A plastic biliary stent is placed, and the scope was withdrawn. IMPRESSION: ERCP images as described. Refer to separate procedure report for full details. These images were submitted for radiologic interpretation only. Please see the procedural report for the amount of contrast and the fluoroscopy time utilized. Electronically Signed   By: Albin Felling M.D.   On: 04/04/2022 13:10    Medications:  ferric gluconate (FERRLECIT) IVPB 62.5 mg (04/03/22 0835)    (feeding supplement) PROSource Plus  30 mL Oral TID BM   sodium chloride   Intravenous Once   sodium chloride   Intravenous Once   cinacalcet  30 mg Oral Q T,Th,Sa-HD   darbepoetin (ARANESP) injection - DIALYSIS  200 mcg Intravenous Q  Tue-HD   diclofenac  100 mg Rectal Once   diphenhydrAMINE  25 mg Oral Once   doxercalciferol  7 mcg Intravenous Q T,Th,Sa-HD   feeding supplement  1 Container Oral TID BM   hydroxyurea  500 mg Oral Daily   pantoprazole  40 mg Oral BID   sodium chloride flush  3 mL Intravenous Q12H   torsemide  20 mg Oral QID    Dialysis Orders: SW TTS 4 h 15 min  400/500  64 kg  2/2.5 bath  P2 No Hep RUA AVF Mircera 225 q2wks, Due 10/17 Venofer 50 mg weekly Hectorol 7 ug iv tiw tts Sensipar 30 mg tiw po tts    Assessment/Plan: 1. Abd pain / N/V - CT abd showed large 25 cm cyst-like structure which may be causing pain. No N/V today. ERCP and stent with GI 10/18. Also considering cyst aspiration this hosp  2. ESRD - on HD TTS. Last HD 04/03/2022. Plan for HD today. Does not appear volume overloaded on exam. UF as tolerated.  3. Sickle Cell Pain Crisis - per primary team 4. Elevated troponin - Per cardiology not ACS. Likely 2/2 ischemia d/t severe anemia. 5. Atrial fib - per primary team/cards 6. BP/Volume - CXR and CT chest do not show signs of fluid overload. Does not appear volume overloaded on exam. UF as tolerated w/HD.  7. Anemia of CKD/sickle cell- Hgb 6.7 s/p 1 unit pRBC 10/17, total 3units during admission. Transfusion threshold 6 per PMD. On aranesp 200 qTues, IV iron ordered-  hgb 8 today  8. Secondary hyperparathyroidism - Ca 7.8, last Phos 6.9. Resume binders when diet advanced 9. Elevated LFT's - GI consulting 6. Nutrition - Currently clear liquid. Renal diet w/fluid restriction when advanced. Alb 2.6 - add protein supplements when diet advanced.     [facility] 04/05/2022,9:32 AM  LOS: 4 days

## 2022-04-05 NOTE — Anesthesia Postprocedure Evaluation (Signed)
Anesthesia Post Note  Patient: Patrick [name]  Procedure(s) Performed: ENDOSCOPIC RETROGRADE CHOLANGIOPANCREATOGRAPHY (ERCP) BILIARY STENT PLACEMENT SPHINCTEROTOMY     Patient location during evaluation: PACU Anesthesia Type: General Level of consciousness: awake and alert Pain management: pain level controlled Vital Signs Assessment: post-procedure vital signs reviewed and stable Respiratory status: spontaneous breathing, nonlabored ventilation, respiratory function stable and patient connected to nasal cannula oxygen Cardiovascular status: blood pressure returned to baseline and stable Postop Assessment: no apparent nausea or vomiting Anesthetic complications: no   No notable events documented.  Last Vitals:  Vitals:   04/05/22 0930 04/05/22 1000  BP: 115/85 118/77  Pulse: 99 (!) 124  Resp: 11 12  Temp:    SpO2: 100% 100%    Last Pain:  Vitals:   04/05/22 1025  TempSrc:   PainSc: 10-Worst pain ever                 Keiarra Charon

## 2022-04-05 NOTE — Procedures (Signed)
HD Note:  Some information was entered later than the data was gathered due to patient care needs. The stated time with the data is accurate.   Received patient in bed to unit.  Alert and oriented.  Informed consent signed and in chart.   Patient tolerated the HD treatment without difficulty.  He was experiencing pain, see MAR.  Pain was unrelieved.  Transported back to the room  Alert, continues to be in pain. Hand-off given to patient's nurse.   Access used: Right arm fistula Access issues: None  Total UF removed: 1500 ml     [facility]

## 2022-04-05 NOTE — Progress Notes (Addendum)
[facility] Progress Note  CC:  Abdominal fluid collection  Subjective:  Still with pain in abdomen and back.  No appetite.  Had a BM this AM--patient cannot see but he says there was no mention from nursing staff of concern of bleeding via stool contents.  Objective:  Vital signs in last 24 hours: Temp:  [97.4 F (36.3 C)-98.4 F (36.9 C)] 98.3 F (36.8 C) (10/19 0806) Pulse Rate:  [75-124] 99 (10/19 0930) Resp:  [10-20] 11 (10/19 0930) BP: (87-151)/(59-117) 115/85 (10/19 0930) SpO2:  [92 %-100 %] 100 % (10/19 0930) Weight:  [62.3 kg] 62.3 kg (10/19 0806) Last BM Date : 04/02/22 General:  Alert, chronically ill-appearing, in NAD Heart:  Regular rate and rhythm; no murmurs Pulm:  CTAB.  No W/R/R. Abdomen:  Soft, somewhat distended and firm.  BS present.  Diffuse TTP. Extremities:  Without edema. Neurologic:  Alert and oriented x 4;  grossly normal neurologically. Psych:  Alert and cooperative. Normal mood and affect.  Intake/Output from previous day: 10/18 0701 - 10/19 0700 In: 850 [I.V.:400; IV Piggyback:450] Out: 0   Lab Results: Recent Labs    04/03/22 0745 04/04/22 0246 04/04/22 1454 04/05/22 0836  WBC 9.1 9.4  --  9.8  HGB 6.1* 6.7* 8.5* 8.0*  HCT 16.2* 18.7* 23.7* 22.0*  PLT 164 170  --  171   BMET Recent Labs    04/03/22 0320 04/04/22 0246 04/05/22 0836  NA 131* 137 135  K 4.4 3.9 3.8  CL 91* 96* 97*  CO2 20* 24 24  GLUCOSE 106* 94 105*  BUN 80* 34* 51*  CREATININE 10.75* 5.62* 7.36*  CALCIUM 8.0* 7.8* 8.0*   LFT Recent Labs    04/05/22 0836  ALBUMIN 2.8*   Hepatitis Panel Recent Labs    04/02/22 1746 04/03/22 0320  HEPBSAG NON REACTIVE NON REACTIVE  HCVAB NON REACTIVE  --     DG ERCP  Result Date: 04/04/2022 CLINICAL DATA:  ERCP EXAM: ERCP TECHNIQUE: Multiple spot images obtained with the fluoroscopic device and submitted for interpretation post-procedure. FLUOROSCOPY: Refer to separate report COMPARISON:  None  Available. FINDINGS: A total of 16 fluoroscopic spot images taken during ERCP are submitted for review. Images demonstrate scope overlying the upper abdomen with wire catheterization of the extrahepatic biliary tree. Contrast injection opacifies both the extrahepatic and intrahepatic biliary ducts. The patient is status post cholecystectomy with a cystic duct stump. Surgical clips are present over the gallbladder fossa. Intrahepatic biliary ducts are normal in caliber. There is slight prominence of the common bile duct. A plastic biliary stent is placed, and the scope was withdrawn. IMPRESSION: ERCP images as described. Refer to separate procedure report for full details. These images were submitted for radiologic interpretation only. Please see the procedural report for the amount of contrast and the fluoroscopy time utilized. Electronically Signed   By: Albin Felling M.D.   On: 04/04/2022 13:10    Assessment / Plan: Upper abdominal cyst w mass effect to adjacent organs.. 04/02/2022 EGD showing small hiatal hernia.  Irregular Z-line.  Extrinsic compression to the stomach.  Presence of previously placed endoclips in the gastric body.  Erosive gastropathy with out bleeding.  Extrinsic duodenal deformity.  Appearance suggesting prior sphincterotomy at major papilla.  Biopsies obtained of the stomach. 04/02/2022 EUS.  Cystic lesion seen in the abdominal cavity/peripancreatic region, FNA cytology obtained and pending.  Appearance suspicious for pancreatic pseudocyst versus biloma.  Gastric biopsies ok.  So far cultures without  growth.  Other studies pending.   ESRD on HD   Elevated LFTs, T. bili fluctuating, alk Phos rising, AST rising, ALT consistently normal.  Choledocholithiasis hx ERCP sphincterotomy, stone extraction, pancreatic and CBD stent placement 05/2021.  Lap chole 05/2021. HCV negative, HBV immune.  LFTs not checked in the past couple of day.   Anemia. Hgb 7.8.. 6.1.. 6.8.. 6.1..6.7.Marland Kitchenone unit  PRBCs and up to 8.5 grams, now 8.0 grams today.  FOBT positive.  EGD as above on 10/16.  -IR consult was placed late yesterday for IR drainage.  They have seen him in consult this morning and are planning for drain placement later today. -Trend labs.     LOS: 4 days   Laban Emperor. Zehr  04/05/2022, 9:34 AM    Attending Physician Note   I have taken an interval history, reviewed the chart and examined the patient. I performed a substantive portion of this encounter, including complete performance of at least one of the key components, in conjunction with the APP. I agree with the APP's note, impression and recommendations with my edits. My additional impressions and recommendations are as follows.   Large intraabdominal cyst, suspected biloma. Fluid analysis from EUS aspiration of bilious appearing fluid on Monday is not yet available. IR consult appreciated. Repeat aspiration and consideration of drainage options. Elevated LFTs. S/P ERCP with sphincterotomy extension and biliary stent placement yesterday. S/P lap chole in Dec. 2022.  Gastric ulcer, erosive gastritis causing anemia and heme + stools. Large cyst causing gastric compression. Continue pantoprazole 40 mg po bid. Colonoscopy in 2019 was normal except for small internal hemorrhoids.   Lucio Edward, MD [facility] See AM[facility], [facility], for our on call provider

## 2022-04-05 NOTE — Progress Notes (Addendum)
Overnight progress note  Notified by RN that patient is in A-fib with heart rate in the 120s-to 140s with exertion but improved to 101 with rest.  Automatic blood pressure 92/58, manual checked and low at 84/56.  Patient is asymptomatic.  Temperature 98.3 F, respiratory rate 18, satting 96% on room air.  Chart reviewed, patient with history of sickle cell disease, ESRD on HD, permanent A-fib not on anticoagulation due to history of GI bleed, severe mitral and tricuspid regurgitation, chronic systolic CHF (echo done 58/25/1898 showing EF 40 to 45%) admitted for upper abdominal cyst/likely pancreatic pseudocyst and underwent EGD and FNAC.  Status post ERCP and biliary stent placement on 10/18.  He required 3 units PRBCs during this hospitalization due to worsening anemia.  Last HD was today.  -250 cc normal saline bolus ordered, monitor vital signs very closely -Hold torsemide -Stat EKG -Urgent labs ordered including CBC, CMP, PT/INR, lactate  Addendum 04/06/2022 at 12:05 AM: Currently in A-fib with rate in the 90s.  EKG showing QTc 562.  Also checking magnesium level.

## 2022-04-05 NOTE — Progress Notes (Signed)
(  MD) Hospitalist notified of Pt heart rate in the 120's-140's (Afib) for the past hour upon mild exertion. B/P 92/58. (Dematex has been held all day.)  Currently HR is trending down to normal, Pt is asymptomatic.

## 2022-04-05 NOTE — Consult Note (Signed)
Chief Complaint: Patient was seen in consultation today for intraabdominal cyst Chief Complaint  Patient presents with   Sickle Cell Pain Crisis   at the request of Azucena Freed PA-C  Referring Physician(s): Azucena Freed PA-C  Supervising Physician: Corrie Mckusick  Patient Status: [facility] - In-pt  History of Present Illness: Patrick [name] is a 66 y.o. male with PMHs of CHF, end-stage renal disease,  pulmonary hypertension, sickle cell disease presenting to the emergency department on 10/14 due to sickle cell pain crisis, found to have a  cyst in upper abdomen who presents for aspiration and possible drain placement.   Patient presented to ED due to sickle cell pain crisis on 10/4, labs notable for anemia hgb 6.3, lipase 93,  and elevated LFTs with T bili 2.2, He suddenly became tachycardiac and tachypneic, CTA chest and CT AP with was obtained which was negative for PE, but showed a new 25.6 cm bilobed cyst in the central upper abdomen.   GI was consulted and patient underwent EUS on 10/16  which showed hiatal hernia, extrinsic impression/compression on the stomach, multiple dispersed small erosions with no bleeding and no stigmata of recent bleeding, and an anechoic lesion suggestive of a cyst was identified in the abdominal cavity/peripancreatic region, biopsy of the erosion and needle aspiration of the cyst was performed. Pathology of the erosion was negative for H pylori, culture of the cyst has shown no growth for 2 days. Cytology is pending.   ERCP on 10/18 again shoed gastric antrum ulcer, diffuse erosive gastritis, normal post cholecystectomy cholangiogram, a biliary stent was placed.   IR was requested for aspiration of the intraabdominal cyst by GI team, case reviewed and approved for CT guided aspiration and possible drain placement by Dr. Earleen Newport.   Patient laying in bed, not in acute distress, receiving HD.  Reports abd and leg pain, he thinks it is combination of the  cyst and sickle cell pain crisis.  Denise headache, fever, chills, shortness of breath, cough, chest pain, nausea ,vomiting, and bleeding.    Past Medical History:  Diagnosis Date   A-fib ([place])    Blood dyscrasia    Blood transfusion    "I've had a bunch of them"   CHF (congestive heart failure) ([place])    followed by hf clinic   Dialysis patient [facility]) 04/2019   AV fistula (right arm), TTS   Dysrhythmia    A-Fib per patient   Gout    Headache(784.0)    "probably weekly" (01/13/2014)   Legally blind    Mitral regurgitation    Pulmonary hypertension (HCC)    Shortness of breath dyspnea    Sickle cell disease, type SS (HCC)    Swelling abdomen    Swelling of extremity, left    Swelling of extremity, right    Tricuspid regurgitation     Past Surgical History:  Procedure Laterality Date   A/V FISTULAGRAM Right 07/29/2020   Procedure: A/V FISTULAGRAM - Right Arm;  Surgeon: Angelia Mould, MD;  Location: [place] [facility];  Service: Cardiovascular;  Laterality: Right;   BASCILIC VEIN TRANSPOSITION Right 04/12/2014   Procedure: BASILIC VEIN TRANSPOSITION;  Surgeon: Elam Dutch, MD;  Location: [place];  Service: Vascular;  Laterality: Right;   BIOPSY  08/04/2020   Procedure: BIOPSY;  Surgeon: Ladene Artist, MD;  Location: [place];  Service: Gastroenterology;;   BIOPSY  06/28/2021   Procedure: BIOPSY;  Surgeon: Jerene Bears, MD;  Location: [place];  Service: Gastroenterology;;   BIOPSY  04/02/2022   Procedure: BIOPSY;  Surgeon: Irving Copas., MD;  Location: [place];  Service: Gastroenterology;;   BOWEL RESECTION N/A 05/24/2021   Procedure: SMALL BOWEL RESECTION;  Surgeon: Rolm Bookbinder, MD;  Location: [place];  Service: General;  Laterality: N/A;   CARDIOVERSION N/A 06/05/2018   Procedure: CARDIOVERSION;  Surgeon: Jolaine Artist, MD;  Location: [facility] ENDOSCOPY;  Service: Cardiovascular;  Laterality: N/A;   CARDIOVERSION N/A 10/06/2019    Procedure: CARDIOVERSION;  Surgeon: Jolaine Artist, MD;  Location: [facility] ENDOSCOPY;  Service: Cardiovascular;  Laterality: N/A;   CHOLECYSTECTOMY N/A 05/24/2021   Procedure: OPEN CHOLECYSTECTOMY;  Surgeon: Rolm Bookbinder, MD;  Location: [place];  Service: General;  Laterality: N/A;   COLONOSCOPY WITH PROPOFOL N/A 09/24/2017   Procedure: COLONOSCOPY WITH PROPOFOL;  Surgeon: Jackquline Denmark, MD;  Location: [place];  Service: Endoscopy;  Laterality: N/A;   ESOPHAGOGASTRODUODENOSCOPY N/A 09/19/2017   Procedure: ESOPHAGOGASTRODUODENOSCOPY (EGD);  Surgeon: Jackquline Denmark, MD;  Location: [facility] ENDOSCOPY;  Service: Endoscopy;  Laterality: N/A;   ESOPHAGOGASTRODUODENOSCOPY N/A 05/29/2021   Procedure: ESOPHAGOGASTRODUODENOSCOPY (EGD);  Surgeon: Mil[place] Banister, MD;  Location: [facility] ENDOSCOPY;  Service: Gastroenterology;  Laterality: N/A;   ESOPHAGOGASTRODUODENOSCOPY (EGD) WITH PROPOFOL N/A 08/04/2020   Procedure: ESOPHAGOGASTRODUODENOSCOPY (EGD) WITH PROPOFOL;  Surgeon: Ladene Artist, MD;  Location: [place];  Service: Gastroenterology;  Laterality: N/A;   ESOPHAGOGASTRODUODENOSCOPY (EGD) WITH PROPOFOL N/A 06/28/2021   Procedure: ESOPHAGOGASTRODUODENOSCOPY (EGD) WITH PROPOFOL;  Surgeon: Jerene Bears, MD;  Location: [facility] ENDOSCOPY;  Service: Gastroenterology;  Laterality: N/A;   ESOPHAGOGASTRODUODENOSCOPY (EGD) WITH PROPOFOL N/A 08/10/2021   Procedure: ESOPHAGOGASTRODUODENOSCOPY (EGD) WITH PROPOFOL;  Surgeon: Jackquline Denmark, MD;  Location: [place];  Service: Gastroenterology;  Laterality: N/A;   ESOPHAGOGASTRODUODENOSCOPY (EGD) WITH PROPOFOL N/A 08/02/2021   Procedure: ESOPHAGOGASTRODUODENOSCOPY (EGD) WITH PROPOFOL;  Surgeon: Lavena Bullion, DO;  Location: [place];  Service: Gastroenterology;  Laterality: N/A;   ESOPHAGOGASTRODUODENOSCOPY (EGD) WITH PROPOFOL N/A 04/02/2022   Procedure: ESOPHAGOGASTRODUODENOSCOPY (EGD) WITH PROPOFOL;  Surgeon: Rush Landmark Telford Nab., MD;  Location: [place];  Service: Gastroenterology;  Laterality: N/A;   FINE NEEDLE ASPIRATION  04/02/2022   Procedure: FINE NEEDLE ASPIRATION (FNA) LINEAR;  Surgeon: Irving Copas., MD;  Location: [facility] ENDOSCOPY;  Service: Gastroenterology;;   HEMOSTASIS CLIP PLACEMENT  05/29/2021   Procedure: HEMOSTASIS CLIP PLACEMENT;  Surgeon: [place] Banister, MD;  Location: [facility] ENDOSCOPY;  Service: Gastroenterology;;   HOT HEMOSTASIS N/A 05/29/2021   Procedure: HOT HEMOSTASIS (ARGON PLASMA COAGULATION/BICAP);  Surgeon: [place] Banister, MD;  Location: [facility] ENDOSCOPY;  Service: Gastroenterology;  Laterality: N/A;   HOT HEMOSTASIS N/A 06/28/2021   Procedure: HOT HEMOSTASIS (ARGON PLASMA COAGULATION/BICAP);  Surgeon: Jerene Bears, MD;  Location: [facility] ENDOSCOPY;  Service: Gastroenterology;  Laterality: N/A;   HOT HEMOSTASIS N/A 08/10/2021   Procedure: HOT HEMOSTASIS (ARGON PLASMA COAGULATION/BICAP);  Surgeon: Jackquline Denmark, MD;  Location: [facility] ENDOSCOPY;  Service: Gastroenterology;  Laterality: N/A;   HOT HEMOSTASIS N/A 08/02/2021   Procedure: HOT HEMOSTASIS (ARGON PLASMA COAGULATION/BICAP);  Surgeon: Lavena Bullion, DO;  Location: [facility] ENDOSCOPY;  Service: Gastroenterology;  Laterality: N/A;   IR PERC TUN PERIT CATH WO PORT S&I /IMAG  05/23/2021   IR REMOVAL TUN CV CATH W/O FL  06/10/2021   IR US GUIDE VASC ACCESS RIGHT  05/23/2021   LAPAROSCOPY N/A 05/24/2021   Procedure: LAPAROSCOPY DIAGNOSTIC;  Surgeon: Rolm Bookbinder, MD;  Location: [place];  Service: General;  Laterality: N/A;   LAPAROTOMY N/A 05/24/2021  Procedure: EXPLORATORY LAPAROTOMY;  Surgeon: Rolm Bookbinder, MD;  Location: [place];  Service: General;  Laterality: N/A;   LEFT AND RIGHT HEART CATHETERIZATION WITH CORONARY ANGIOGRAM N/A 06/11/2011   Procedure: LEFT AND RIGHT HEART CATHETERIZATION WITH CORONARY ANGIOGRAM;  Surgeon: Larey Dresser, MD;  Location: [facility] CATH LAB;  Service: Cardiovascular;  Laterality: N/A;   PERIPHERAL VASCULAR BALLOON  ANGIOPLASTY Right 07/29/2020   Procedure: PERIPHERAL VASCULAR BALLOON ANGIOPLASTY;  Surgeon: Angelia Mould, MD;  Location: [place] [facility];  Service: Cardiovascular;  Laterality: Right;  arm fistula   REVISON OF ARTERIOVENOUS FISTULA Right 08/08/2020   Procedure: ANEURYSM EXCISION OF RIGHT UPPER EXTREMITY ARTERIOVENOUS FISTULA;  Surgeon: Angelia Mould, MD;  Location: [place];  Service: Vascular;  Laterality: Right;   REVISON OF ARTERIOVENOUS FISTULA Right 03/21/2022   Procedure: EXCISION OF ULCERATED SKIN OF RIGHT ARTERIOVENOUS FISTULA REVISION;  Surgeon: Marty Heck, MD;  Location: [facility] OR;  Service: Vascular;  Laterality: Right;   SCLEROTHERAPY  05/29/2021   Procedure: Clide Deutscher;  Surgeon: [place] Banister, MD;  Location: [facility] ENDOSCOPY;  Service: Gastroenterology;;   Lavell Islam REMOVAL  05/29/2021   Procedure: STENT REMOVAL;  Surgeon: Milus Banister, MD;  Location: [place];  Service: Gastroenterology;;   TEE WITHOUT CARDIOVERSION N/A 12/27/2015   Procedure: TRANSESOPHAGEAL ECHOCARDIOGRAM (TEE);  Surgeon: Sueanne Margarita, MD;  Location: [place];  Service: Cardiovascular;  Laterality: N/A;   UPPER ESOPHAGEAL ENDOSCOPIC ULTRASOUND (EUS) N/A 04/02/2022   Procedure: UPPER ESOPHAGEAL ENDOSCOPIC ULTRASOUND (EUS);  Surgeon: Irving Copas., MD;  Location: [place];  Service: Gastroenterology;  Laterality: N/A;    Allergies: Patient has no known allergies.  Medications: Prior to Admission medications   Medication Sig Start Date End Date Taking? Authorizing Provider  acetaminophen (TYLENOL) 500 MG tablet Take 2,000 mg by mouth in the morning, at noon, and at bedtime.   Yes [provider]  allopurinol (ZYLOPRIM) 100 MG tablet TAKE 1 TABLET BY MOUTH EVERY DAY Patient taking differently: Take 100 mg by mouth daily. 02/08/22  Yes Fenton Foy, NP  AURYXIA 1 GM 210 MG(Fe) tablet Take 210 mg by mouth in the morning and at bedtime. 02/11/21  Yes  [provider]  Dextromethorphan-guaiFENesin [facility] COUGH & CHEST CONGEST PO) Take 1 Capful by mouth 3 (three) times daily as needed (cough).   Yes [provider]  diphenhydramine-acetaminophen (TYLENOL PM) 25-500 MG TABS tablet Take 2 tablets by mouth at bedtime as needed (sleep/pain).   Yes [provider]  folic acid (FOLVITE) 1 MG tablet TAKE 1 TABLET BY MOUTH EVERY DAY Patient taking differently: Take 1 mg by mouth daily. 02/22/21  Yes Dorena Dew, FNP  hydroxyurea (HYDREA) 500 MG capsule Take 1 capsule (500 mg total) by mouth daily. 11/08/21  Yes Dorena Dew, FNP  lidocaine-prilocaine (EMLA) cream Apply 1 application. topically Every Tuesday,Thursday,and Saturday with dialysis. 09/08/19  Yes [provider]  Oxycodone HCl 10 MG TABS Take 1 tablet (10 mg total) by mouth every 6 (six) hours as needed. Patient taking differently: Take 10 mg by mouth 2 (two) times daily as needed (pain). 03/27/22  Yes Dorena Dew, FNP  pantoprazole (PROTONIX) 40 MG tablet TAKE 1 TABLET BY MOUTH TWICE A DAY Patient taking differently: Take 40 mg by mouth 2 (two) times daily. 01/15/22  Yes Willia Craze, NP  tiZANidine (ZANAFLEX) 4 MG tablet TAKE 1/2 TABLET (2MG  TOTAL) BY MOUTH EVERY 8 HOURS AS NEEDED FOR MUSCLE SPASM Patient taking differently: Take  2 mg by mouth 3 (three) times daily as needed for muscle spasms. 03/27/22  Yes Dorena Dew, FNP  torsemide (DEMADEX) 20 MG tablet Take 20 mg by mouth in the morning, at noon, in the evening, and at bedtime. 09/03/21  Yes [provider]  fluticasone (FLONASE) 50 MCG/ACT nasal spray Place 2 sprays into both nostrils daily. Patient not taking: Reported on 04/01/2022 09/04/21   Bo Merino I, NP  Nutritional Supplements (,FEEDING SUPPLEMENT, PROSOURCE PLUS) liquid Take 30 mLs by mouth 2 (two) times daily between meals. Patient not taking: Reported on 04/01/2022 08/30/21   Terrilee Croak, MD  Vitamin  D, Ergocalciferol, (DRISDOL) 1.25 MG (50000 UNIT) CAPS capsule Take 1 capsule (50,000 Units total) by mouth once a week. Monday Patient not taking: Reported on 04/01/2022 11/08/21   Dorena Dew, FNP     Family History  Problem Relation Age of Onset   Alcohol abuse Mother    Liver disease Mother    Cirrhosis Mother    Heart attack Mother     Social History   Socioeconomic History   Marital status: Divorced    Spouse name: Not on file   Number of children: Not on file   Years of education: Not on file   Highest education level: Not on file  Occupational History   Occupation: disabled  Tobacco Use   Smoking status: Former    Packs/day: 0.50    Years: 41.00    Total pack years: 20.50    Types: Cigarettes    Quit date: 12/08/2015    Years since quitting: 6.3   Smokeless tobacco: Never  Vaping Use   Vaping Use: Never used  Substance and Sexual Activity   Alcohol use: Not Currently    Alcohol/week: 3.0 standard drinks of alcohol    Types: 1 Glasses of wine, 2 Cans of beer per week    Comment: none since starting dialysis 06/2021   Drug use: No   Sexual activity: Not Currently  Other Topics Concern   Not on file  Social History Narrative   Not on file   Social Determinants of Health   Financial Resource Strain: Low Risk  (06/02/2021)   Overall Financial Resource Strain (CARDIA)    Difficulty of Paying Living Expenses: Not very hard  Food Insecurity: No Food Insecurity (04/01/2022)   Hunger Vital Sign    Worried About Running Out of Food in the Last Year: Never true    [place] in the Last Year: Never true  Transportation Needs: No Transportation Needs (04/01/2022)   PRAPARE - Hydrologist (Medical): No    Lack of Transportation (Non-Medical): No  Physical Activity: Sufficiently Active (10/01/2019)   Exercise Vital Sign    Days of Exercise per Week: 5 days    Minutes of Exercise per Session: 90 min  Stress: Not on file  Social  Connections: Not on file     Review of Systems: A 12 point ROS discussed and pertinent positives are indicated in the HPI above.  All other systems are negative.  Vital Signs: BP 105/70 (BP Location: Right Arm)   Pulse (!) 116   Temp 98.3 F (36.8 C) (Oral)   Resp 17   Ht 5\' 11"  (1.803 m)   Wt 137 lb 5.6 oz (62.3 kg)   SpO2 100%   BMI 19.16 kg/m    Physical Exam Vitals reviewed.  Constitutional:      General: He is not  in acute distress.    Appearance: He is ill-appearing.  HENT:     Head: Normocephalic.     Mouth/Throat:     Mouth: Mucous membranes are moist.     Pharynx: Oropharynx is clear.  Cardiovascular:     Rate and Rhythm: Normal rate and regular rhythm.     Heart sounds: Normal heart sounds.  Pulmonary:     Effort: Pulmonary effort is normal.     Breath sounds: Normal breath sounds.  Abdominal:     General: Abdomen is flat. Bowel sounds are normal.     Palpations: Abdomen is soft.  Skin:    General: Skin is warm and dry.     Coloration: Skin is not jaundiced or pale.  Neurological:     Mental Status: He is alert and oriented to person, place, and time.  Psychiatric:        Mood and Affect: Mood normal.        Behavior: Behavior normal.     MD Evaluation Airway: WNL Heart: WNL Abdomen: WNL Chest/ Lungs: WNL ASA  Classification: 3 Mallampati/Airway Score: Three  Imaging: DG ERCP  Result Date: 04/04/2022 CLINICAL DATA:  ERCP EXAM: ERCP TECHNIQUE: Multiple spot images obtained with the fluoroscopic device and submitted for interpretation post-procedure. FLUOROSCOPY: Refer to separate report COMPARISON:  None Available. FINDINGS: A total of 16 fluoroscopic spot images taken during ERCP are submitted for review. Images demonstrate scope overlying the upper abdomen with wire catheterization of the extrahepatic biliary tree. Contrast injection opacifies both the extrahepatic and intrahepatic biliary ducts. The patient is status post cholecystectomy with a  cystic duct stump. Surgical clips are present over the gallbladder fossa. Intrahepatic biliary ducts are normal in caliber. There is slight prominence of the common bile duct. A plastic biliary stent is placed, and the scope was withdrawn. IMPRESSION: ERCP images as described. Refer to separate procedure report for full details. These images were submitted for radiologic interpretation only. Please see the procedural report for the amount of contrast and the fluoroscopy time utilized. Electronically Signed   By: Albin Felling M.D.   On: 04/04/2022 13:10   ECHOCARDIOGRAM COMPLETE  Result Date: 04/02/2022    ECHOCARDIOGRAM REPORT   Patient Name:   Patrick [name] Date of Exam: 04/02/2022 Medical Rec #:  [id number]           Height:       71.0 in Accession #:    [id number]          Weight:       144.0 lb Date of Birth:  Dec 28, 1955           BSA:          1.834 m Patient Age:    45 years            BP:           93/69 mmHg Patient Gender: M                   HR:           96 bpm. Exam Location:  Inpatient Procedure: 2D Echo, Color Doppler and Cardiac Doppler Indications:    elevated trops  History:        Patient has prior history of Echocardiogram examinations, most                 recent 05/23/2021. CHF, Arrythmias:Atrial Fibrillation,  Signs/Symptoms:Chest Pain; Risk Factors:Hypertension.  Sonographer:    Melissa Morford RDCS (AE, PE) Referring Phys: [id number] RONDELL A SMITH IMPRESSIONS  1. Left ventricular ejection fraction, by estimation, is 40 to 45%. The left ventricle has mildly decreased function. The left ventricle demonstrates global hypokinesis. The left ventricular internal cavity size was mildly to moderately dilated. Left ventricular diastolic parameters are indeterminate.  2. Right ventricular systolic function is mildly reduced. The right ventricular size is moderately-to-severely enlarged. There is moderately elevated pulmonary artery systolic pressure. The estimated right  ventricular systolic pressure is 19.6 mmHg.  3. Left atrial size was severely dilated.  4. Right atrial size was severely dilated.  5. The mitral valve is abnormal. There is at least moderate, mainly posteriorly directed (although there are multiple MR jets) functional/secondary mitral regurgitation likely due to LA and LV dilation.  6. Tricuspid valve regurgitation is severe.  7. The aortic valve is tricuspid. There is mild calcification of the aortic valve. There is mild thickening of the aortic valve. Aortic valve regurgitation is not visualized. Aortic valve sclerosis/calcification is present, without any evidence of aortic stenosis.  8. The inferior vena cava is dilated in size with <50% respiratory variability, suggesting right atrial pressure of 15 mmHg. Comparison(s): Compared to prior TTE on 05/2021, LVEF remains stable at 40-45%, MR appears slightly less on current study, there continues to be severe TR. FINDINGS  Left Ventricle: Left ventricular ejection fraction, by estimation, is 40 to 45%. The left ventricle has mildly decreased function. The left ventricle demonstrates global hypokinesis. The left ventricular internal cavity size was mildly to moderately dilated. There is no left ventricular hypertrophy. Left ventricular diastolic parameters are indeterminate. Right Ventricle: The right ventricular size is moderately-to-severely enlarged. No increase in right ventricular wall thickness. Right ventricular systolic function is mildly reduced. There is moderately elevated pulmonary artery systolic pressure. The tricuspid regurgitant velocity is 2.87 m/s, and with an assumed right atrial pressure of 15 mmHg, the estimated right ventricular systolic pressure is 22.2 mmHg. Left Atrium: Left atrial size was severely dilated. Right Atrium: Right atrial size was severely dilated. Pericardium: Trivial pericardial effusion is present. The pericardial effusion is circumferential. Mitral Valve: The mitral valve is  abnormal. There is mild thickening of the mitral valve leaflet(s). There is at least moderate mainly posteriorly directed (although there are multiple MR jets) functional/secondary mitral regurgitation likely due to LA and LV dilation. mitral valve regurgitation. Tricuspid Valve: The tricuspid valve is normal in structure. Tricuspid valve regurgitation is severe. Aortic Valve: The aortic valve is tricuspid. There is mild calcification of the aortic valve. There is mild thickening of the aortic valve. Aortic valve regurgitation is not visualized. Aortic valve sclerosis/calcification is present, without any evidence of aortic stenosis. Pulmonic Valve: The pulmonic valve was normal in structure. Pulmonic valve regurgitation is trivial. Aorta: The aortic root and ascending aorta are structurally normal, with no evidence of dilitation. Venous: The inferior vena cava is dilated in size with less than 50% respiratory variability, suggesting right atrial pressure of 15 mmHg. IAS/Shunts: The atrial septum is grossly normal.  LEFT VENTRICLE PLAX 2D LVIDd:         6.50 cm      Diastology LVIDs:         5.00 cm      LV e' medial:    10.00 cm/s LV PW:         1.10 cm      LV E/e' medial:  10.3 LV IVS:  1.00 cm      LV e' lateral:   14.10 cm/s LVOT diam:     2.20 cm      LV E/e' lateral: 7.3 LV SV:         59 LV SV Index:   32 LVOT Area:     3.80 cm  LV Volumes (MOD) LV vol d, MOD A2C: 138.0 ml LV vol d, MOD A4C: 103.0 ml LV vol s, MOD A2C: 82.8 ml LV vol s, MOD A4C: 71.6 ml LV SV MOD A2C:     55.2 ml LV SV MOD A4C:     103.0 ml LV SV MOD BP:      39.3 ml RIGHT VENTRICLE RV S prime:     11.60 cm/s TAPSE (M-mode): 1.9 cm LEFT ATRIUM              Index        RIGHT ATRIUM           Index LA diam:        6.50 cm  3.54 cm/m   RA Area:     31.40 cm LA Vol (A2C):   124.0 ml 67.63 ml/m  RA Volume:   112.00 ml 61.08 ml/m LA Vol (A4C):   130.0 ml 70.90 ml/m LA Biplane Vol: 133.0 ml 72.54 ml/m  AORTIC VALVE LVOT Vmax:   86.70  cm/s LVOT Vmean:  58.500 cm/s LVOT VTI:    0.156 m  AORTA Ao Root diam: 3.50 cm Ao Asc diam:  3.40 cm MITRAL VALVE                TRICUSPID VALVE MV Area (PHT): 3.50 cm     TR Peak grad:   32.9 mmHg MV Decel Time: 217 msec     TR Vmax:        287.00 cm/s MV E velocity: 103.00 cm/s MV A velocity: 36.10 cm/s   SHUNTS MV E/A ratio:  2.85         Systemic VTI:  0.16 m                             Systemic Diam: 2.20 cm Gwyndolyn Kaufman MD Electronically signed by Gwyndolyn Kaufman MD Signature Date/Time: 04/02/2022/12:16:30 PM    Final    CT Abdomen Pelvis W Contrast  Result Date: 03/31/2022 CLINICAL DATA:  Cough, sickle cell crisis, abdominal pain. EXAM: CT ANGIOGRAPHY CHEST CT ABDOMEN AND PELVIS WITH CONTRAST TECHNIQUE: Multidetector CT imaging of the chest was performed using the standard protocol during bolus administration of intravenous contrast. Multiplanar CT image reconstructions and MIPs were obtained to evaluate the vascular anatomy. Multidetector CT imaging of the abdomen and pelvis was performed using the standard protocol during bolus administration of intravenous contrast. RADIATION DOSE REDUCTION: This exam was performed according to the departmental dose-optimization program which includes automated exposure control, adjustment of the mA and/or kV according to patient size and/or use of iterative reconstruction technique. CONTRAST:  178mL OMNIPAQUE IOHEXOL 300 MG/ML  SOLN COMPARISON:  Chest radiograph dated 03/31/2022. CTA chest dated 08/29/2021. FINDINGS: CTA CHEST FINDINGS Cardiovascular: Satisfactory opacification of the bilateral pulmonary arteries to the segmental level. No evidence of pulmonary embolism. Study is not tailored for evaluation of the thoracic aorta. No evidence of thoracic aortic aneurysm or dissection. Cardiomegaly.  No pericardial effusion. Mediastinum/Nodes: No suspicious mediastinal lymphadenopathy. Visualized thyroid is unremarkable. Lungs/Pleura: Evaluation of the lung  parenchyma is constrained by respiratory motion. Within  that constraint, there are no suspicious pulmonary nodules. Mild patchy bilateral lower lobe opacities with mild dependent left lower lobe opacity, likely atelectasis. No focal consolidation. No frank interstitial edema. Mild centrilobular emphysematous changes, upper lung predominant. Trace bilateral pleural effusions. No pneumothorax. Musculoskeletal: Vertebral body changes related to sickle cell disease. No focal osseous lesions. Review of the MIP images confirms the above findings. CT ABDOMEN and PELVIS FINDINGS Hepatobiliary: Liver is notable for changes of hepatic congestion. Status post cholecystectomy. No intrahepatic or extrahepatic ductal dilatation. Pancreas: Mild parenchymal atrophy. 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 Adrenals/Urinary Tract: Adrenal glands are within normal limits. Bilateral renal cysts, measuring up to 13 mm in the left upper kidney (series 6/image 22), measuring tubal low-density, benign (Bosniak I). No follow-up is recommended. No hydronephrosis. Bladder is underdistended but unremarkable. Stomach/Bowel: Mass effect on the stomach by the dominant upper abdominal bilobed cyst (described below). Surgical clips in the stomach (series 6/image 45). No evidence of bowel obstruction. Normal appendix (series 6/image 49). Suture line in the right lower abdomen (series 6/image 58). No colonic wall thickening or inflammatory changes. Vascular/Lymphatic: No evidence of abdominal aortic aneurysm. Atherosclerotic calcifications of the abdominal aorta and branch vessels. No suspicious abdominopelvic lymphadenopathy. Reproductive: Prostate is unremarkable. Other: 15.3 x 25.6 x 19.0 cm bilobed cyst in the central upper abdomen (series 6/image 32), most associated with the liver and stomach on the current study, although statistically most commonly reflecting a pseudocyst, less likely a mesenteric cyst. This is new from  the prior. Mass effect on adjacent organs. At the upper limits of normal for simple fluid density internally. No definite solid components or internal enhancement. Trace abdominopelvic ascites. Musculoskeletal: Vertebral body changes related to sickle cell disease. No focal osseous lesions. Review of the MIP images confirms the above findings. IMPRESSION: No evidence of pulmonary embolism. Cardiomegaly with trace bilateral pleural effusions. No frank interstitial edema. Mild bilateral lower lobe atelectasis. 25.6 cm bilobed cyst in the central upper abdomen, most commonly reflecting a pseudocyst, less likely a mesenteric cyst. This is new from the prior CT. Associated mass effect on adjacent organs. Consider EUS for cyst aspiration/sampling via the stomach. Otherwise, no acute findings in the abdomen/pelvis. Aortic Atherosclerosis (ICD10-I70.0) and Emphysema (ICD10-J43.9). Electronically Signed   By: Julian Hy M.D.   On: 03/31/2022 17:30   CT Angio Chest PE W and/or Wo Contrast  Result Date: 03/31/2022 CLINICAL DATA:  Cough, sickle cell crisis, abdominal pain. EXAM: CT ANGIOGRAPHY CHEST CT ABDOMEN AND PELVIS WITH CONTRAST TECHNIQUE: Multidetector CT imaging of the chest was performed using the standard protocol during bolus administration of intravenous contrast. Multiplanar CT image reconstructions and MIPs were obtained to evaluate the vascular anatomy. Multidetector CT imaging of the abdomen and pelvis was performed using the standard protocol during bolus administration of intravenous contrast. RADIATION DOSE REDUCTION: This exam was performed according to the departmental dose-optimization program which includes automated exposure control, adjustment of the mA and/or kV according to patient size and/or use of iterative reconstruction technique. CONTRAST:  143mL OMNIPAQUE IOHEXOL 300 MG/ML  SOLN COMPARISON:  Chest radiograph dated 03/31/2022. CTA chest dated 08/29/2021. FINDINGS: CTA CHEST FINDINGS  Cardiovascular: Satisfactory opacification of the bilateral pulmonary arteries to the segmental level. No evidence of pulmonary embolism. Study is not tailored for evaluation of the thoracic aorta. No evidence of thoracic aortic aneurysm or dissection. Cardiomegaly.  No pericardial effusion. Mediastinum/Nodes: No suspicious mediastinal lymphadenopathy. Visualized thyroid is unremarkable. Lungs/Pleura: Evaluation of the lung parenchyma is constrained by  respiratory motion. Within that constraint, there are no suspicious pulmonary nodules. Mild patchy bilateral lower lobe opacities with mild dependent left lower lobe opacity, likely atelectasis. No focal consolidation. No frank interstitial edema. Mild centrilobular emphysematous changes, upper lung predominant. Trace bilateral pleural effusions. No pneumothorax. Musculoskeletal: Vertebral body changes related to sickle cell disease. No focal osseous lesions. Review of the MIP images confirms the above findings. CT ABDOMEN and PELVIS FINDINGS Hepatobiliary: Liver is notable for changes of hepatic congestion. Status post cholecystectomy. No intrahepatic or extrahepatic ductal dilatation. Pancreas: Mild parenchymal atrophy. 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 Adrenals/Urinary Tract: Adrenal glands are within normal limits. Bilateral renal cysts, measuring up to 13 mm in the left upper kidney (series 6/image 22), measuring tubal low-density, benign (Bosniak I). No follow-up is recommended. No hydronephrosis. Bladder is underdistended but unremarkable. Stomach/Bowel: Mass effect on the stomach by the dominant upper abdominal bilobed cyst (described below). Surgical clips in the stomach (series 6/image 45). No evidence of bowel obstruction. Normal appendix (series 6/image 49). Suture line in the right lower abdomen (series 6/image 58). No colonic wall thickening or inflammatory changes. Vascular/Lymphatic: No evidence of abdominal aortic aneurysm.  Atherosclerotic calcifications of the abdominal aorta and branch vessels. No suspicious abdominopelvic lymphadenopathy. Reproductive: Prostate is unremarkable. Other: 15.3 x 25.6 x 19.0 cm bilobed cyst in the central upper abdomen (series 6/image 32), most associated with the liver and stomach on the current study, although statistically most commonly reflecting a pseudocyst, less likely a mesenteric cyst. This is new from the prior. Mass effect on adjacent organs. At the upper limits of normal for simple fluid density internally. No definite solid components or internal enhancement. Trace abdominopelvic ascites. Musculoskeletal: Vertebral body changes related to sickle cell disease. No focal osseous lesions. Review of the MIP images confirms the above findings. IMPRESSION: No evidence of pulmonary embolism. Cardiomegaly with trace bilateral pleural effusions. No frank interstitial edema. Mild bilateral lower lobe atelectasis. 25.6 cm bilobed cyst in the central upper abdomen, most commonly reflecting a pseudocyst, less likely a mesenteric cyst. This is new from the prior CT. Associated mass effect on adjacent organs. Consider EUS for cyst aspiration/sampling via the stomach. Otherwise, no acute findings in the abdomen/pelvis. Aortic Atherosclerosis (ICD10-I70.0) and Emphysema (ICD10-J43.9). Electronically Signed   By: Julian Hy M.D.   On: 03/31/2022 17:30   DG Chest Port 1 View  Result Date: 03/31/2022 CLINICAL DATA:  Cough, sickle crisis EXAM: PORTABLE CHEST 1 VIEW COMPARISON:  12/21/2021 FINDINGS: Cardiomegaly. Both lungs are clear. The visualized skeletal structures are unremarkable. IMPRESSION: Cardiomegaly without acute abnormality of the lungs in AP portable projection. Electronically Signed   By: Delanna Ahmadi M.D.   On: 03/31/2022 15:50    Labs:  CBC: Recent Labs    04/01/22 1158 04/02/22 0347 04/03/22 0745 04/04/22 0246 04/04/22 1454  WBC 7.8 8.4 9.1 9.4  --   HGB 6.1* 6.8* 6.1*  6.7* 8.5*  HCT 16.4* 18.2* 16.2* 18.7* 23.7*  PLT 174 166 164 170  --     COAGS: Recent Labs    05/29/21 1327 06/27/21 0830 08/06/21 0219 03/31/22 1427  INR 1.3* 1.9* 1.4* 1.4*  APTT 33  --   --   --     BMP: Recent Labs    03/31/22 1427 03/31/22 1731 04/02/22 0347 04/03/22 0320 04/04/22 0246  NA 132* 129* 132* 131* 137  K 3.5 3.7 5.1 4.4 3.9  CL 89*  --  90* 91* 96*  CO2 19*  --  19* 20* 24  GLUCOSE 84  --  78 106* 94  BUN 52*  --  63* 80* 34*  CALCIUM 9.1  --  8.7* 8.0* 7.8*  CREATININE 7.35*  --  10.00* 10.75* 5.62*  GFRNONAA 8*  --  5* 5* 11*    LIVER FUNCTION TESTS: Recent Labs    11/21/21 2347 11/22/21 0350 11/24/21 0938 01/04/22 1759 03/31/22 1427 04/02/22 0347 04/03/22 0320  BILITOT 1.4*  --  2.5* 1.8* 2.2*  --   --   AST 42*  --  54* 20 79*  --   --   ALT 29  --  42 13 28  --   --   ALKPHOS 194*  --  339* 170* 487*  --   --   PROT 8.4*  --  8.0 8.0 8.1  --   --   ALBUMIN 3.4*   < > 3.2* 4.1 3.8 2.7* 2.6*   < > = values in this interval not displayed.    TUMOR MARKERS: No results for input(s): "AFPTM", "CEA", "CA199", "CHROMGRNA" in the last 8760 hours.  Assessment and Plan: 66 y.o. male with recent finding of intraabdominal cyst who is in need of aspiration for further work up.   NPO since MN VS tachycardic but stable  CBC today stable, hgb 8 INR on 10/14 1.4  Not on AC/AP   Risks and benefits discussed with the patient including bleeding, infection, damage to adjacent structures, bowel perforation/fistula connection, and sepsis.  All of the patient's questions were answered, patient is agreeable to proceed. Consent signed and in IR.   Thank you for this interesting consult.  I greatly enjoyed meeting Patrick [name] and look forward to participating in their care.  A copy of this report was sent to the requesting provider on this date.  Electronically Signed: Tera Mater, PA-C 04/05/2022, 9:01 AM   I spent a total of 40  Minutes    in face to face in clinical consultation, greater than 50% of which was counseling/coordinating care for intraabdominal cyst.   This chart was dictated using voice recognition software.  Despite best efforts to proofread,  errors can occur which can change the documentation meaning.

## 2022-04-06 ENCOUNTER — Encounter: Payer: Self-pay | Admitting: Gastroenterology

## 2022-04-06 ENCOUNTER — Other Ambulatory Visit: Payer: Self-pay

## 2022-04-06 ENCOUNTER — Telehealth: Payer: Self-pay

## 2022-04-06 ENCOUNTER — Inpatient Hospital Stay (HOSPITAL_COMMUNITY): Payer: HMO

## 2022-04-06 DIAGNOSIS — N186 End stage renal disease: Secondary | ICD-10-CM | POA: Diagnosis not present

## 2022-04-06 DIAGNOSIS — D5700 Hb-SS disease with crisis, unspecified: Secondary | ICD-10-CM | POA: Diagnosis not present

## 2022-04-06 DIAGNOSIS — K668 Other specified disorders of peritoneum: Secondary | ICD-10-CM

## 2022-04-06 DIAGNOSIS — I5022 Chronic systolic (congestive) heart failure: Secondary | ICD-10-CM | POA: Diagnosis not present

## 2022-04-06 DIAGNOSIS — K839 Disease of biliary tract, unspecified: Secondary | ICD-10-CM

## 2022-04-06 DIAGNOSIS — R188 Other ascites: Secondary | ICD-10-CM | POA: Diagnosis not present

## 2022-04-06 LAB — COMPREHENSIVE METABOLIC PANEL
ALT: 14 U/L (ref 0–44)
AST: 25 U/L (ref 15–41)
Albumin: 2.6 g/dL — ABNORMAL LOW (ref 3.5–5.0)
Alkaline Phosphatase: 334 U/L — ABNORMAL HIGH (ref 38–126)
Anion gap: 14 (ref 5–15)
BUN: 26 mg/dL — ABNORMAL HIGH (ref 8–23)
CO2: 26 mmol/L (ref 22–32)
Calcium: 7.5 mg/dL — ABNORMAL LOW (ref 8.9–10.3)
Chloride: 95 mmol/L — ABNORMAL LOW (ref 98–111)
Creatinine, Ser: 4.57 mg/dL — ABNORMAL HIGH (ref 0.61–1.24)
GFR, Estimated: 14 mL/min — ABNORMAL LOW (ref >60)
Glucose, Bld: 118 mg/dL — ABNORMAL HIGH (ref 70–99)
Potassium: 3 mmol/L — ABNORMAL LOW (ref 3.5–5.1)
Sodium: 135 mmol/L (ref 135–145)
Total Bilirubin: 1.9 mg/dL — ABNORMAL HIGH (ref 0.3–1.2)
Total Protein: 6.5 g/dL (ref 6.5–8.1)

## 2022-04-06 LAB — CBC
HCT: 22 % — ABNORMAL LOW (ref 39.0–52.0)
Hemoglobin: 8.1 g/dL — ABNORMAL LOW (ref 13.0–17.0)
MCH: 35.7 pg — ABNORMAL HIGH (ref 26.0–34.0)
MCHC: 36.8 g/dL — ABNORMAL HIGH (ref 30.0–36.0)
MCV: 96.9 fL (ref 80.0–100.0)
Platelets: 155 10*3/uL (ref 150–400)
RBC: 2.27 MIL/uL — ABNORMAL LOW (ref 4.22–5.81)
RDW: 30.2 % — ABNORMAL HIGH (ref 11.5–15.5)
WBC: 5.5 10*3/uL (ref 4.0–10.5)
nRBC: 31.6 % — ABNORMAL HIGH (ref 0.0–0.2)

## 2022-04-06 LAB — BODY FLUID CELL COUNT WITH DIFFERENTIAL
Eos, Fluid: 5 %
Lymphs, Fluid: 28 %
Monocyte-Macrophage-Serous Fluid: 20 % — ABNORMAL LOW (ref 50–90)
Neutrophil Count, Fluid: 47 % — ABNORMAL HIGH (ref 0–25)
Total Nucleated Cell Count, Fluid: 31 cu mm (ref 0–1000)

## 2022-04-06 LAB — LACTIC ACID, PLASMA: Lactic Acid, Venous: 1.7 mmol/L (ref 0.5–1.9)

## 2022-04-06 LAB — PROTIME-INR
INR: 1.5 — ABNORMAL HIGH (ref 0.8–1.2)
Prothrombin Time: 17.7 seconds — ABNORMAL HIGH (ref 11.4–15.2)

## 2022-04-06 LAB — MAGNESIUM: Magnesium: 1.6 mg/dL — ABNORMAL LOW (ref 1.7–2.4)

## 2022-04-06 LAB — BILIRUBIN, DIRECT: Bilirubin, Direct: 0.9 mg/dL — ABNORMAL HIGH (ref 0.0–0.2)

## 2022-04-06 MED ORDER — LIDOCAINE HCL (PF) 1 % IJ SOLN
5.0000 mL | Freq: Once | INTRAMUSCULAR | Status: AC
  Administered 2022-04-06: 5 mL

## 2022-04-06 MED ORDER — POTASSIUM CHLORIDE CRYS ER 20 MEQ PO TBCR
40.0000 meq | EXTENDED_RELEASE_TABLET | Freq: Once | ORAL | Status: AC
  Administered 2022-04-06: 40 meq via ORAL
  Filled 2022-04-06: qty 2

## 2022-04-06 MED ORDER — CHLORHEXIDINE GLUCONATE CLOTH 2 % EX PADS
6.0000 | MEDICATED_PAD | Freq: Every day | CUTANEOUS | Status: DC
  Administered 2022-04-06: 6 via TOPICAL

## 2022-04-06 MED ORDER — MAGNESIUM SULFATE 2 GM/50ML IV SOLN: 2.0000 g | Freq: Once | INTRAVENOUS | Status: DC

## 2022-04-06 MED ORDER — MAGNESIUM SULFATE IN D5W 1-5 GM/100ML-% IV SOLN
1.0000 g | Freq: Once | INTRAVENOUS | Status: AC
  Administered 2022-04-06: 1 g via INTRAVENOUS
  Filled 2022-04-06: qty 100

## 2022-04-06 NOTE — Progress Notes (Addendum)
[facility] Progress Note  CC:  Abdominal fluid collection   Subjective:  Feels ok.  No new complaints.  Still with abdominal pain/pressure and back pain but looks more comfortable today.  Objective:  Vital signs in last 24 hours: Temp:  [97.9 F (36.6 C)-99 F (37.2 C)] 97.9 F (36.6 C) (10/20 0946) Pulse Rate:  [86-128] 103 (10/20 0525) Resp:  [11-26] 18 (10/20 0525) BP: (88-119)/(57-79) 94/67 (10/20 0946) SpO2:  [96 %-100 %] 100 % (10/20 0525) Weight:  [61.7 kg] 61.7 kg (10/20 0525) Last BM Date : 04/02/22 General:  Alert, Well-developed, in NAD Heart:  Slightly tachy; no murmurs Pulm:  CTAB. No W/R/R. Abdomen:  Soft, somewhat tense and distended.  BS present.  Mild diffuse TTP. Extremities:  Without edema. Neurologic:  Alert and oriented x 4;  grossly normal neurologically. Psych:  Alert and cooperative. Normal mood and affect.  Intake/Output from previous day: 10/19 0701 - 10/20 0700 In: 540 [P.O.:540] Out: 1501 [Stool:1]  Lab Results: Recent Labs    04/04/22 0246 04/04/22 1454 04/05/22 0836 04/05/22 2356  WBC 9.4  --  9.8 5.5  HGB 6.7* 8.5* 8.0* 8.1*  HCT 18.7* 23.7* 22.0* 22.0*  PLT 170  --  171 155   BMET Recent Labs    04/04/22 0246 04/05/22 0836 04/05/22 2356  NA 137 135 135  K 3.9 3.8 3.0*  CL 96* 97* 95*  CO2 _0 GLUCOSE 94 105* 118*  BUN 34* 51* 26*  CREATININE 5.62* 7.36* 4.57*  CALCIUM 7.8* 8.0* 7.5*   LFT Recent Labs    04/05/22 2356  PROT 6.5  ALBUMIN 2.6*  AST 25  ALT 14  ALKPHOS 334*  BILITOT 1.9*  BILIDIR 0.9*   PT/INR Recent Labs    04/05/22 2356  LABPROT 17.7*  INR 1.5*   DG ERCP  Result Date: 04/04/2022 CLINICAL DATA:  ERCP EXAM: ERCP TECHNIQUE: Multiple spot images obtained with the fluoroscopic device and submitted for interpretation post-procedure. FLUOROSCOPY: Refer to separate report COMPARISON:  None Available. FINDINGS: A total of 16 fluoroscopic spot images taken during ERCP are  submitted for review. Images demonstrate scope overlying the upper abdomen with wire catheterization of the extrahepatic biliary tree. Contrast injection opacifies both the extrahepatic and intrahepatic biliary ducts. The patient is status post cholecystectomy with a cystic duct stump. Surgical clips are present over the gallbladder fossa. Intrahepatic biliary ducts are normal in caliber. There is slight prominence of the common bile duct. A plastic biliary stent is placed, and the scope was withdrawn. IMPRESSION: ERCP images as described. Refer to separate procedure report for full details. These images were submitted for radiologic interpretation only. Please see the procedural report for the amount of contrast and the fluoroscopy time utilized. Electronically Signed   By: Albin Felling M.D.   On: 04/04/2022 13:10     Assessment / Plan: Upper abdominal cyst w mass effect to adjacent organs.. 04/02/2022 EGD showing small hiatal hernia.  Irregular Z-line.  Extrinsic compression to the stomach.  Presence of previously placed endoclips in the gastric body.  Erosive gastropathy with out bleeding.  Extrinsic duodenal deformity.  Appearance suggesting prior sphincterotomy at major papilla.  Biopsies obtained of the stomach. 04/02/2022 EUS.  Cystic lesion seen in the abdominal cavity/peripancreatic region, FNA cytology obtained and pending.  Appearance suspicious for pancreatic pseudocyst versus biloma.  Gastric biopsies ok.  So far cultures without growth.  Cytology without malignant cells.  Lipase on fluid is 37.  Bilirubin fluid result is still pending.    ESRD on HD   Elevated LFTs, T. bili fluctuating, alk Phos rising, AST rising, ALT consistently normal.  Choledocholithiasis hx ERCP sphincterotomy, stone extraction, pancreatic and CBD stent placement 05/2021.  Lap chole 05/2021. HCV negative, HBV immune.  LFTs from yesterday have improved compared to 6 days ago. ERCP 10/18 with sphincterotomy extension,  biliary stent placement.    Anemia. Hgb 7.8.. 6.1.. 6.8.. 6.1..6.7.Marland Kitchenone unit PRBCs and up to 8.5 grams, then 8.1 grams 10/19.  FOBT positive.  EGD as above on 10/16.  Gastric ulcer, erosive gastritis likely cause of FOBT + stool. Continue pantoprazole 40 mg po bid  -IR is planning for aspiration of the upper intraabdominal cyst today.  ? Drain placement as well. -Trend labs.   LOS: 5 days   Patrick [name]. Zehr  04/06/2022, 9:51 AM     Attending Physician Note   I have taken an interval history, reviewed the chart and examined the patient. I performed a substantive portion of this encounter, including complete performance of at least one of the key components, in conjunction with the APP. I agree with the APP's note, impression and recommendations.   Lucio Edward, MD [facility] See AM[facility], [facility], for our on call provider

## 2022-04-06 NOTE — Telephone Encounter (Signed)
ERCP scheduled for 05/17/22 at 12 noon with GM at [facility]   Left message on machine to call back

## 2022-04-06 NOTE — Progress Notes (Addendum)
[facility] Progress Note   Subjective:   Patient seen and examined at bedside.  Noted to have A fib overnight but denies CP or palpitations.  Reports diarrhea x1 and some abdominal discomfort.  Denies SOB, n/v and edema.  Tolerated dialysis well yesterday. Unable to IR aspiration of cyst yesterday and scheduled to complete this AM. I saw him after the procedure-  says took 3.25 liters of fluid from cyst and belly feels better  Objective Vitals:   04/05/22 2052 04/05/22 2252 04/05/22 2256 04/06/22 0525  BP: (!) 92/58 (!) 88/57  96/69  Pulse: 86 (!) 101 96 (!) 103  Resp: 18 18  18   Temp: 98.5 F (36.9 C) 98.3 F (36.8 C)  99 F (37.2 C)  TempSrc: Oral Oral  Oral  SpO2:  96%  100%  Weight:    61.7 kg  Height:       Physical Exam General:chronically ill appearing male in NAD Heart:RRR, no mrg Lungs:CTAB, nml WOB on RA Abdomen:soft, mildly distended, +tenderness Extremities:no LE edema Dialysis Access: RU AVF +b/t   Filed Weights   04/03/22 1224 04/05/22 0806 04/06/22 0525  Weight: 62.5 kg 62.3 kg 61.7 kg    Intake/Output Summary (Last 24 hours) at 04/06/2022 0816 Last data filed at 04/06/2022 0200 Gross per 24 hour  Intake 540 ml  Output 1501 ml  Net -961 ml    Additional Objective Labs: Basic Metabolic Panel: Recent Labs  Lab 04/02/22 0347 04/03/22 0320 04/04/22 0246 04/05/22 0836 04/05/22 2356  NA 132* 131* 137 135 135  K 5.1 4.4 3.9 3.8 3.0*  CL 90* 91* 96* 97* 95*  CO2 19* 20* 24 24 26   GLUCOSE 78 106* 94 105* 118*  BUN 63* 80* 34* 51* 26*  CREATININE 10.00* 10.75* 5.62* 7.36* 4.57*  CALCIUM 8.7* 8.0* 7.8* 8.0* 7.5*  PHOS 6.9* 6.9*  --  4.4  --    Liver Function Tests: Recent Labs  Lab 03/31/22 1427 04/02/22 0347 04/03/22 0320 04/05/22 0836 04/05/22 2356  AST 79*  --   --   --  25  ALT 28  --   --   --  14  ALKPHOS 487*  --   --   --  334*  BILITOT 2.2*  --   --   --  1.9*  PROT 8.1  --   --   --  6.5  ALBUMIN 3.8   < > 2.6*  2.8* 2.6*   < > = values in this interval not displayed.   Recent Labs  Lab 03/31/22 1427  LIPASE 93*   CBC: Recent Labs  Lab 04/01/22 1158 04/02/22 0347 04/03/22 0745 04/04/22 0246 04/04/22 1454 04/05/22 0836 04/05/22 2356  WBC 7.8 8.4 9.1 9.4  --  9.8 5.5  NEUTROABS 5.9  --  7.8* 6.8  --   --   --   HGB 6.1* 6.8* 6.1* 6.7* 8.5* 8.0* 8.1*  HCT 16.4* 18.2* 16.2* 18.7* 23.7* 22.0* 22.0*  MCV 113.1* 105.8* 105.9* 100.5*  --  95.7 96.9  PLT 174 166 164 170  --  171 155   Blood Culture    Component Value Date/Time   SDES WOUND 04/02/2022 1530   SPECREQUEST ABDOMINAL CYST 04/02/2022 1530   CULT  04/02/2022 1530    NO GROWTH 3 DAYS NO ANAEROBES ISOLATED; CULTURE IN PROGRESS FOR 5 DAYS Performed at [facility], [place] [address] [place], [place] [address]    REPTSTATUS PENDING 04/02/2022 1530  Studies/Results: DG ERCP  Result Date: 04/04/2022 CLINICAL DATA:  ERCP EXAM: ERCP TECHNIQUE: Multiple spot images obtained with the fluoroscopic device and submitted for interpretation post-procedure. FLUOROSCOPY: Refer to separate report COMPARISON:  None Available. FINDINGS: A total of 16 fluoroscopic spot images taken during ERCP are submitted for review. Images demonstrate scope overlying the upper abdomen with wire catheterization of the extrahepatic biliary tree. Contrast injection opacifies both the extrahepatic and intrahepatic biliary ducts. The patient is status post cholecystectomy with a cystic duct stump. Surgical clips are present over the gallbladder fossa. Intrahepatic biliary ducts are normal in caliber. There is slight prominence of the common bile duct. A plastic biliary stent is placed, and the scope was withdrawn. IMPRESSION: ERCP images as described. Refer to separate procedure report for full details. These images were submitted for radiologic interpretation only. Please see the procedural report for the amount of contrast and the fluoroscopy time utilized.  Electronically Signed   By: Albin Felling M.D.   On: 04/04/2022 13:10    Medications:  ferric gluconate (FERRLECIT) IVPB 62.5 mg (04/03/22 0835)    (feeding supplement) PROSource Plus  30 mL Oral TID BM   sodium chloride   Intravenous Once   sodium chloride   Intravenous Once   cinacalcet  30 mg Oral Q T,Th,Sa-HD   darbepoetin (ARANESP) injection - DIALYSIS  200 mcg Intravenous Q Tue-HD   diclofenac  100 mg Rectal Once   diphenhydrAMINE  25 mg Oral Once   doxercalciferol  7 mcg Intravenous Q T,Th,Sa-HD   feeding supplement  1 Container Oral TID BM   hydroxyurea  500 mg Oral Daily   pantoprazole  40 mg Oral BID   potassium chloride  40 mEq Oral Once   sodium chloride flush  3 mL Intravenous Q12H    Dialysis Orders: SW TTS 4 h 15 min  400/500  64 kg  2/2.5 bath  P2 No Hep RUA AVF Mircera 225 q2wks, Due 10/17 Venofer 50 mg weekly Hectorol 7 ug iv tiw tts Sensipar 30 mg tiw po tts      Assessment/Plan: 1. Abd pain / N/V - CT abd showed large 25 cm cyst-like structure which may be causing pain. No N/V today. ERCP and stent with GI 10/18. To have cyst aspiration by IR some time today.  2. ESRD - on HD TTS. Last HD 04/05/2022. Plan for HD tomorrow. Does not appear volume overloaded on exam. UF as tolerated. High k bath  3. Sickle Cell Pain Crisis - per primary team 4. Elevated troponin - Per cardiology not ACS. Likely 2/2 ischemia d/t severe anemia. 5. Atrial fib - per primary team/cards.   6. BP/Volume - CXR and CT chest do not show signs of fluid overload. Does not appear volume overloaded on exam. UF as tolerated w/HD.  7. Anemia of CKD/sickle cell- Hg^8.1 s/p 1 unit pRBC 10/17, total 3units during admission. Transfusion threshold 6 per PMD. On aranesp 200 qTues, IV iron ordered. 8. Secondary hyperparathyroidism - CCa and phos in goal.  Resume binders when diet advanced 9. Elevated LFT's - GI consulting 6. Nutrition - Currently NPO. Renal diet w/fluid restriction when advanced.  Alb 2.6 - add protein supplements when diet advanced  Jen Mow, PA-C [place] 04/06/2022,8:16 AM  LOS: 5 days   Patient seen and examined, agree with above note with above modifications. Feels better s/p cyst aspiration-  no other complaints-  hgb staying up for now-  routine HD tomorrow via AVF Corliss Parish,  MD 04/06/2022

## 2022-04-06 NOTE — Care Management Important Message (Signed)
Important Message  Patient Details  Name: DAYRON ODLAND MRN: [id number] Date of Birth: 1955-11-06   Medicare Important Message Given:  Yes  Due to illness patient could not sign. Signature copy left with the patient     [name] 04/06/2022, 2:42 PM

## 2022-04-06 NOTE — Progress Notes (Signed)
See GM note

## 2022-04-06 NOTE — Progress Notes (Signed)
PROGRESS NOTE    Patrick [name]  QIH:[id number] DOB: [birth year] DOA: 03/31/2022 PCP: [name], MD   Brief Narrative:  66 year old male with history of blindness, sickle cell/thalassemia followed by sickle cell service, chronic pain syndrome, former smoker, end-stage renal disease on hemodialysis, permanent A-fib not on anticoagulation due to history of GI bleeding from AVMs treated with APC in 07/2021, nonischemic cardiomyopathy, small bowel resection for ischemic colitis in 05/2021, recurrent MRSA gastritis requiring admission in 06/2021 and was found to have Candida, Eliquis subsequently discontinued completely; small bowel obstruction requiring admission in 08/2021 managed conservatively presented with worsening pain, cough, decreased appetite and abdominal pain.  He was found to have low hemoglobin with mildly elevated LFTs, BNP of 3312, high-sensitivity troponin 427 and 378.  CT abdomen and pelvis was negative for PE.  Cardiology was consulted, patient was managed conservatively.  He received blood transfusion.  CT of abdomen had shown cystic mass in the abdomen.  GI was consulted.  He underwent EGD on 04/02/2022 which showed cystic lesion/?  Pancreatic pseudocyst, FNAC performed.  Echo showed EF of 40 to 45%.  He underwent ERCP on 04/04/2022.  Assessment & Plan:   Abdominal pain/upper abdominal cyst?  Pancreatic pseudocyst Elevated LFTs -Underwent EGD and FNAC of the cyst: GI following.  Status post ERCP and biliary stent placed on 04/04/2022 by GI. -Currently on oral ciprofloxacin. -IR planning for aspiration of the upper intra-abdominal cyst today.  Acute on chronic anemia of chronic disease -Has required 3 units packed red cell transfusion so far during this hospitalization, most recently on 04/04/2022 for hemoglobin of 6.7.  Hemoglobin 8.1 today.  Monitor H&H.  Elevated troponins Severe mitral and tricuspid regurgitation Chronic systolic heart failure -Patient was  evaluated by cardiology who has already signed off.  Outpatient follow-up with cardiology.  Currently not in any chest pain.  Echo showed EF of 40 to 45%. -Volume being managed by hemodialysis and patient is still on torsemide. -Strict input and output.  Daily weights.  Fluid restriction.  Permanent A-fib -Has had intermittent tachycardia overnight.  Not a candidate for anticoagulation  History of hemorrhagic gastritis -Continue Protonix twice a day  Sickle cell anemia/thalassemia -Sickle cell service aware of the patient: They are available to assist if pain needs arise. -Continue hydroxyurea.  Continue current pain medication regimen.  End-stage renal disease on hemodialysis -Nephrology following.  Dialysis as per nephrology schedule  Hyponatremia -Improved.  Macrocytosis -Questionable cause.  DVT prophylaxis: SCDs Code Status: Full Family Communication: None at bedside Disposition Plan: Status is: Inpatient Remains inpatient appropriate because: Of severity of illness.     Consultants: GI/nephrology  Procedures: As above ERCP on 04/04/2022 Impression:               - Apparent prior sphincterotomy.                           - Prior cholecystectomy.                           - Normal appearing post cholecystectomy                            cholangiogram.                           - Sphinctertomy extended.                           -  Biliary stent placed.                           - Gastric antrum ulcer.                           - Diffuse erosive gastritis.                           - Marked extrinsic gastric compression. Recommendation:           - Return patient to hospital ward for ongoing care.                           - Observe patient's clinical course following                            today's ERCP with therapeutic intervention.                           - Erosive gastritis is the suspected source of heme                            positive stool,  anemia.  Antimicrobials: Ciprofloxacin from 04/02/2022 onwards   Subjective: Patient seen and examined at bedside.  Still complains of intermittent abdominal pain and abdominal tightness.  Feels mildly short of breath.  No chest pain, fever, vomiting reported. Objective: Vitals:   04/05/22 2052 04/05/22 2252 04/05/22 2256 04/06/22 0525  BP: (!) 92/58 (!) 88/57  96/69  Pulse: 86 (!) 101 96 (!) 103  Resp: 18 18  18   Temp: 98.5 F (36.9 C) 98.3 F (36.8 C)  99 F (37.2 C)  TempSrc: Oral Oral  Oral  SpO2:  96%  100%  Weight:    61.7 kg  Height:        Intake/Output Summary (Last 24 hours) at 04/06/2022 0743 Last data filed at 04/06/2022 0200 Gross per 24 hour  Intake 540 ml  Output 1501 ml  Net -961 ml    Filed Weights   04/03/22 1224 04/05/22 0806 04/06/22 0525  Weight: 62.5 kg 62.3 kg 61.7 kg    Examination:  General: No acute distress.  Still on room air.  Looks chronically ill and deconditioned. ENT/neck: No JVD elevation or palpable neck masses noted  respiratory: Bilateral decreased breath sounds at bases with scattered crackles  CVS: Mild intermittent tachycardia present; S1 and S2 are heard abdominal: Soft, mildly tender, has persistent distention; no organomegaly, normal bowel sounds are heard normally Extremities: No clubbing; mild lower extremity edema present CNS: Alert and oriented.  Still slow to respond.  No focal neurologic deficit.  Moving extremities Lymph: No obvious palpable lymphadenopathy noted  skin: No obvious rashes/petechiae noted psych: Extremely flat affect.  Not agitated currently  musculoskeletal: No obvious joint swelling/deformity    Data Reviewed: I have personally reviewed following labs and imaging studies  CBC: Recent Labs  Lab 03/31/22 1427 03/31/22 1731 04/01/22 1158 04/02/22 0347 04/03/22 0745 04/04/22 0246 04/04/22 1454 04/05/22 0836 04/05/22 2356  WBC 7.5  --  7.8 8.4 9.1 9.4  --  9.8 5.5  NEUTROABS 6.2  --  5.9   --  7.8* 6.8  --   --   --  HGB 6.3*   < > 6.1* 6.8* 6.1* 6.7* 8.5* 8.0* 8.1*  HCT 16.9*   < > 16.4* 18.2* 16.2* 18.7* 23.7* 22.0* 22.0*  MCV 111.2*  --  113.1* 105.8* 105.9* 100.5*  --  95.7 96.9  PLT 191  --  174 166 164 170  --  171 155   < > = values in this interval not displayed.    Basic Metabolic Panel: Recent Labs  Lab 04/01/22 2156 04/02/22 0347 04/03/22 0320 04/04/22 0246 04/05/22 0836 04/05/22 2356  NA  --  132* 131* 137 135 135  K  --  5.1 4.4 3.9 3.8 3.0*  CL  --  90* 91* 96* 97* 95*  CO2  --  19* 20* 24 24 26   GLUCOSE  --  78 106* 94 105* 118*  BUN  --  63* 80* 34* 51* 26*  CREATININE  --  10.00* 10.75* 5.62* 7.36* 4.57*  CALCIUM  --  8.7* 8.0* 7.8* 8.0* 7.5*  MG  --   --   --   --   --  1.6*  PHOS 6.4* 6.9* 6.9*  --  4.4  --     GFR: Estimated Creatinine Clearance: 14.3 mL/min (A) (by C-G formula based on SCr of 4.57 mg/dL (H)). Liver Function Tests: Recent Labs  Lab 03/31/22 1427 04/02/22 0347 04/03/22 0320 04/05/22 0836 04/05/22 2356  AST 79*  --   --   --  25  ALT 28  --   --   --  14  ALKPHOS 487*  --   --   --  334*  BILITOT 2.2*  --   --   --  1.9*  PROT 8.1  --   --   --  6.5  ALBUMIN 3.8 2.7* 2.6* 2.8* 2.6*    Recent Labs  Lab 03/31/22 1427  LIPASE 93*    No results for input(s): "AMMONIA" in the last 168 hours. Coagulation Profile: Recent Labs  Lab 03/31/22 1427 04/05/22 2356  INR 1.4* 1.5*    Cardiac Enzymes: No results for input(s): "CKTOTAL", "CKMB", "CKMBINDEX", "TROPONINI" in the last 168 hours. BNP (last 3 results) No results for input(s): "PROBNP" in the last 8760 hours. HbA1C: No results for input(s): "HGBA1C" in the last 72 hours. CBG: No results for input(s): "GLUCAP" in the last 168 hours. Lipid Profile: No results for input(s): "CHOL", "HDL", "LDLCALC", "TRIG", "CHOLHDL", "LDLDIRECT" in the last 72 hours. Thyroid Function Tests: No results for input(s): "TSH", "T4TOTAL", "FREET4", "T3FREE", "THYROIDAB" in  the last 72 hours. Anemia Panel: No results for input(s): "VITAMINB12", "FOLATE", "FERRITIN", "TIBC", "IRON", "RETICCTPCT" in the last 72 hours. Sepsis Labs: Recent Labs  Lab 03/31/22 1716 04/01/22 1516 04/05/22 2356  PROCALCITON  --  >150.00  --   LATICACIDVEN 1.3  --  1.7     Recent Results (from the past 240 hour(s))  Resp Panel by RT-PCR (Flu A&B, Covid) Anterior Nasal Swab     Status: None   Collection Time: 03/31/22  2:27 PM   Specimen: Anterior Nasal Swab  Result Value Ref Range Status   SARS Coronavirus 2 by RT PCR NEGATIVE NEGATIVE Final    Comment: (NOTE) SARS-CoV-2 target nucleic acids are NOT DETECTED.  The SARS-CoV-2 RNA is generally detectable in upper respiratory specimens during the acute phase of infection. The lowest concentration of SARS-CoV-2 viral copies this assay can detect is 138 copies/mL. A negative result does not preclude SARS-Cov-2 infection and should not be used as the sole  basis for treatment or other patient management decisions. A negative result may occur with  improper specimen collection/handling, submission of specimen other than nasopharyngeal swab, presence of viral mutation(s) within the areas targeted by this assay, and inadequate number of viral copies(<138 copies/mL). A negative result must be combined with clinical observations, patient history, and epidemiological information. The expected result is Negative.  Fact Sheet for Patients:  EntrepreneurPulse.com.au  Fact Sheet for Healthcare Providers:  IncredibleEmployment.be  This test is no t yet approved or cleared by the [place] FDA and  has been authorized for detection and/or diagnosis of SARS-CoV-2 by FDA under an Emergency Use Authorization (EUA). This EUA will remain  in effect (meaning this test can be used) for the duration of the COVID-19 declaration under Section 564(b)(1) of the Act, 21 U.S.C.section 360bbb-3(b)(1), unless  the authorization is terminated  or revoked sooner.       Influenza A by PCR NEGATIVE NEGATIVE Final   Influenza B by PCR NEGATIVE NEGATIVE Final    Comment: (NOTE) The Xpert Xpress SARS-CoV-2/FLU/RSV plus assay is intended as an aid in the diagnosis of influenza from Nasopharyngeal swab specimens and should not be used as a sole basis for treatment. Nasal washings and aspirates are unacceptable for Xpert Xpress SARS-CoV-2/FLU/RSV testing.  Fact Sheet for Patients: EntrepreneurPulse.com.au  Fact Sheet for Healthcare Providers: IncredibleEmployment.be  This test is not yet approved or cleared by the [place] FDA and has been authorized for detection and/or diagnosis of SARS-CoV-2 by FDA under an Emergency Use Authorization (EUA). This EUA will remain in effect (meaning this test can be used) for the duration of the COVID-19 declaration under Section 564(b)(1) of the Act, 21 U.S.C. section 360bbb-3(b)(1), unless the authorization is terminated or revoked.  Performed at [facility], [address], [place], [place] [address]   Aerobic/Anaerobic Culture w Gram Stain (surgical/deep wound)     Status: None (Preliminary result)   Collection Time: 04/02/22  3:30 PM   Specimen: Wound  Result Value Ref Range Status   Specimen Description WOUND  Final   Special Requests ABDOMINAL CYST  Final   Gram Stain   Final    RARE WBC PRESENT, PREDOMINANTLY MONONUCLEAR NO ORGANISMS SEEN    Culture   Final    NO GROWTH 3 DAYS NO ANAEROBES ISOLATED; CULTURE IN PROGRESS FOR 5 DAYS Performed at [facility] [address], [place], [place] [address]    Report Status PENDING  Incomplete         Radiology Studies: DG ERCP  Result Date: 04/04/2022 CLINICAL DATA:  ERCP EXAM: ERCP TECHNIQUE: Multiple spot images obtained with the fluoroscopic device and submitted for interpretation post-procedure. FLUOROSCOPY: Refer to  separate report COMPARISON:  None Available. FINDINGS: A total of 16 fluoroscopic spot images taken during ERCP are submitted for review. Images demonstrate scope overlying the upper abdomen with wire catheterization of the extrahepatic biliary tree. Contrast injection opacifies both the extrahepatic and intrahepatic biliary ducts. The patient is status post cholecystectomy with a cystic duct stump. Surgical clips are present over the gallbladder fossa. Intrahepatic biliary ducts are normal in caliber. There is slight prominence of the common bile duct. A plastic biliary stent is placed, and the scope was withdrawn. IMPRESSION: ERCP images as described. Refer to separate procedure report for full details. These images were submitted for radiologic interpretation only. Please see the procedural report for the amount of contrast and the fluoroscopy time utilized. Electronically Signed   By: Murrell Redden  El-Abd M.D.   On: 04/04/2022 13:10        Scheduled Meds:  (feeding supplement) PROSource Plus  30 mL Oral TID BM   sodium chloride   Intravenous Once   sodium chloride   Intravenous Once   cinacalcet  30 mg Oral Q T,Th,Sa-HD   darbepoetin (ARANESP) injection - DIALYSIS  200 mcg Intravenous Q Tue-HD   diclofenac  100 mg Rectal Once   diphenhydrAMINE  25 mg Oral Once   doxercalciferol  7 mcg Intravenous Q T,Th,Sa-HD   feeding supplement  1 Container Oral TID BM   hydroxyurea  500 mg Oral Daily   pantoprazole  40 mg Oral BID   sodium chloride flush  3 mL Intravenous Q12H   Continuous Infusions:  ferric gluconate (FERRLECIT) IVPB 62.5 mg (04/03/22 0835)   magnesium sulfate bolus IVPB 1 g (04/06/22 0703)          Aline August, MD [facility] 04/06/2022, 7:43 AM

## 2022-04-07 DIAGNOSIS — R7989 Other specified abnormal findings of blood chemistry: Secondary | ICD-10-CM | POA: Diagnosis not present

## 2022-04-07 DIAGNOSIS — R188 Other ascites: Secondary | ICD-10-CM | POA: Diagnosis not present

## 2022-04-07 DIAGNOSIS — D5700 Hb-SS disease with crisis, unspecified: Secondary | ICD-10-CM | POA: Diagnosis not present

## 2022-04-07 DIAGNOSIS — I5022 Chronic systolic (congestive) heart failure: Secondary | ICD-10-CM | POA: Diagnosis not present

## 2022-04-07 LAB — CBC
HCT: 21.3 % — ABNORMAL LOW (ref 39.0–52.0)
Hemoglobin: 7.6 g/dL — ABNORMAL LOW (ref 13.0–17.0)
MCH: 34.2 pg — ABNORMAL HIGH (ref 26.0–34.0)
MCHC: 35.7 g/dL (ref 30.0–36.0)
MCV: 95.9 fL (ref 80.0–100.0)
Platelets: 164 10*3/uL (ref 150–400)
RBC: 2.22 MIL/uL — ABNORMAL LOW (ref 4.22–5.81)
RDW: 28.6 % — ABNORMAL HIGH (ref 11.5–15.5)
WBC: 5.7 10*3/uL (ref 4.0–10.5)
nRBC: 26.1 % — ABNORMAL HIGH (ref 0.0–0.2)

## 2022-04-07 LAB — RENAL FUNCTION PANEL
Albumin: 2.4 g/dL — ABNORMAL LOW (ref 3.5–5.0)
Anion gap: 12 (ref 5–15)
BUN: 44 mg/dL — ABNORMAL HIGH (ref 8–23)
CO2: 25 mmol/L (ref 22–32)
Calcium: 8 mg/dL — ABNORMAL LOW (ref 8.9–10.3)
Chloride: 97 mmol/L — ABNORMAL LOW (ref 98–111)
Creatinine, Ser: 7.06 mg/dL — ABNORMAL HIGH (ref 0.61–1.24)
GFR, Estimated: 8 mL/min — ABNORMAL LOW (ref >60)
Glucose, Bld: 121 mg/dL — ABNORMAL HIGH (ref 70–99)
Phosphorus: 4.8 mg/dL — ABNORMAL HIGH (ref 2.5–4.6)
Potassium: 3.6 mmol/L (ref 3.5–5.1)
Sodium: 134 mmol/L — ABNORMAL LOW (ref 135–145)

## 2022-04-07 LAB — AEROBIC/ANAEROBIC CULTURE W GRAM STAIN (SURGICAL/DEEP WOUND): Culture: NO GROWTH

## 2022-04-07 MED ORDER — HEPARIN SODIUM (PORCINE) 1000 UNIT/ML DIALYSIS: 1000.0000 [IU] | INTRAMUSCULAR | Status: DC | PRN

## 2022-04-07 MED ORDER — ALBUMIN HUMAN 25 % IV SOLN
INTRAVENOUS | Status: AC
  Administered 2022-04-07: 12.5 g
  Filled 2022-04-07: qty 50

## 2022-04-07 MED ORDER — LIDOCAINE-PRILOCAINE 2.5-2.5 % EX CREA: 1.0000 | TOPICAL_CREAM | CUTANEOUS | Status: DC | PRN

## 2022-04-07 MED ORDER — ALTEPLASE 2 MG IJ SOLR: 2.0000 mg | Freq: Once | INTRAMUSCULAR | Status: DC | PRN

## 2022-04-07 MED ORDER — ANTICOAGULANT SODIUM CITRATE 4% (200MG/5ML) IV SOLN: 5.0000 mL | Status: DC | PRN

## 2022-04-07 MED ORDER — PENTAFLUOROPROP-TETRAFLUOROETH EX AERO: 1.0000 | INHALATION_SPRAY | CUTANEOUS | Status: DC | PRN

## 2022-04-07 MED ORDER — LIDOCAINE HCL (PF) 1 % IJ SOLN: 5.0000 mL | INTRAMUSCULAR | Status: DC | PRN

## 2022-04-07 NOTE — Progress Notes (Signed)
[facility] Progress Note   Subjective:   Patient seen and examined in dialysis -  was initially feeling better after cyst aspiration but now back pain back  Objective Vitals:   04/07/22 0805 04/07/22 0809 04/07/22 0830 04/07/22 0900  BP:  94/63 93/61 96/67   Pulse:  69 74   Resp: 16 11 16    Temp:      TempSrc:      SpO2:  95% 98%   Weight:      Height:       Physical Exam General:chronically ill appearing male in NAD Heart:RRR, no mrg Lungs:CTAB, nml WOB on RA Abdomen:soft, mildly distended, +tenderness Extremities:no LE edema Dialysis Access: RU AVF +b/t   Filed Weights   04/06/22 2200 04/07/22 0500 04/07/22 0800  Weight: 62.2 kg 62.2 kg 62.2 kg    Intake/Output Summary (Last 24 hours) at 04/07/2022 0902 Last data filed at 04/07/2022 0659 Gross per 24 hour  Intake 957 ml  Output 0 ml  Net 957 ml    Additional Objective Labs: Basic Metabolic Panel: Recent Labs  Lab 04/02/22 0347 04/03/22 0320 04/04/22 0246 04/05/22 0836 04/05/22 2356  NA 132* 131* 137 135 135  K 5.1 4.4 3.9 3.8 3.0*  CL 90* 91* 96* 97* 95*  CO2 19* 20* 24 24 26   GLUCOSE 78 106* 94 105* 118*  BUN 63* 80* 34* 51* 26*  CREATININE 10.00* 10.75* 5.62* 7.36* 4.57*  CALCIUM 8.7* 8.0* 7.8* 8.0* 7.5*  PHOS 6.9* 6.9*  --  4.4  --    Liver Function Tests: Recent Labs  Lab 03/31/22 1427 04/02/22 0347 04/03/22 0320 04/05/22 0836 04/05/22 2356  AST 79*  --   --   --  25  ALT 28  --   --   --  14  ALKPHOS 487*  --   --   --  334*  BILITOT 2.2*  --   --   --  1.9*  PROT 8.1  --   --   --  6.5  ALBUMIN 3.8   < > 2.6* 2.8* 2.6*   < > = values in this interval not displayed.   Recent Labs  Lab 03/31/22 1427  LIPASE 93*   CBC: Recent Labs  Lab 04/01/22 1158 04/02/22 0347 04/03/22 0745 04/04/22 0246 04/04/22 1454 04/05/22 0836 04/05/22 2356 04/07/22 0814  WBC 7.8   < > 9.1 9.4  --  9.8 5.5 5.7  NEUTROABS 5.9  --  7.8* 6.8  --   --   --   --   HGB 6.1*   < > 6.1*  6.7*   < > 8.0* 8.1* 7.6*  HCT 16.4*   < > 16.2* 18.7*   < > 22.0* 22.0* 21.3*  MCV 113.1*   < > 105.9* 100.5*  --  95.7 96.9 95.9  PLT 174   < > 164 170  --  171 155 164   < > = values in this interval not displayed.   Blood Culture    Component Value Date/Time   SDES ABDOMEN 04/06/2022 1306   SPECREQUEST NONE 04/06/2022 1306   CULT PENDING 04/06/2022 1306   REPTSTATUS PENDING 04/06/2022 1306    Studies/Results: CT GUIDED NEEDLE PLACEMENT  Result Date: 04/06/2022 INDICATION: Intra-abdominal fluid collection EXAM: CT-guided aspiration of intra-abdominal fluid collection TECHNIQUE: Multidetector CT imaging of the abdomen was performed following the standard protocol without IV contrast. RADIATION DOSE REDUCTION: This exam was performed according to the departmental dose-optimization program which includes  automated exposure control, adjustment of the mA and/or kV according to patient size and/or use of iterative reconstruction technique. MEDICATIONS: The patient is currently admitted to the hospital and receiving intravenous antibiotics. The antibiotics were administered within an appropriate time frame prior to the initiation of the procedure. ANESTHESIA/SEDATION: Local analgesia COMPLICATIONS: None immediate. PROCEDURE: Informed written consent was obtained from the patient after a thorough discussion of the procedural risks, benefits and alternatives. All questions were addressed. Maximal Sterile Barrier Technique was utilized including caps, mask, sterile gowns, sterile gloves, sterile drape, hand hygiene and skin antiseptic. A timeout was performed prior to the initiation of the procedure. The patient was placed supine on the exam table. Limited CT of the abdomen was performed for planning purposes. This demonstrated large fluid collection in the left hemiabdomen. Skin entry site was marked, and the overlying skin was prepped and draped in the standard sterile fashion. Local analgesia was  obtained with 1% lidocaine. Using intermittent CT fluoroscopy, a 19 gauge Yueh catheter was advanced into the fluid collection. Location was confirmed with return of thin, brown fluid. A total of 3.25 L was aspirated. Samples were sent to the lab for analysis. Postprocedure CT demonstrated appropriate decompression of the fluid cavity. A clean dressing was placed after hemostasis. The patient tolerated the procedure well without immediate complication. IMPRESSION: Successful CT-guided aspiration of left abdominal fluid collection with removal of 3.25 L of thin brown fluid. Electronically Signed   By: Albin Felling M.D.   On: 04/06/2022 13:01    Medications:  anticoagulant sodium citrate     ferric gluconate (FERRLECIT) IVPB 62.5 mg (04/03/22 0835)    (feeding supplement) PROSource Plus  30 mL Oral TID BM   sodium chloride   Intravenous Once   sodium chloride   Intravenous Once   Chlorhexidine Gluconate Cloth  6 each Topical Q0600   cinacalcet  30 mg Oral Q T,Th,Sa-HD   darbepoetin (ARANESP) injection - DIALYSIS  200 mcg Intravenous Q Tue-HD   diclofenac  100 mg Rectal Once   diphenhydrAMINE  25 mg Oral Once   doxercalciferol  7 mcg Intravenous Q T,Th,Sa-HD   feeding supplement  1 Container Oral TID BM   hydroxyurea  500 mg Oral Daily   pantoprazole  40 mg Oral BID   sodium chloride flush  3 mL Intravenous Q12H    Dialysis Orders: SW TTS 4 h 15 min  400/500  64 kg  2/2.5 bath  P2 No Hep RUA AVF Mircera 225 q2wks, Due 10/17 Venofer 50 mg weekly Hectorol 7 ug iv tiw tts Sensipar 30 mg tiw po tts      Assessment/Plan: 1. Abd pain / N/V - CT abd showed large 25 cm cyst-like structure which may be causing pain. No N/V today. ERCP and stent with GI 10/18. s/p cyst aspiration on 10/20-  3.25 liters   2. ESRD - on HD TTS.  HD today on schedule. Does not appear volume overloaded on exam. UF as tolerated. High k bath  3. Sickle Cell Pain Crisis - per primary team-  has dilaudid and oxy  PRN 4. Elevated troponin - Per cardiology not ACS. Likely 2/2 ischemia d/t severe anemia. 5. Atrial fib - per primary team/cards.   6. BP/Volume - CXR and CT chest do not show signs of fluid overload. Does not appear volume overloaded on exam. Min UF w/HD.  7. Anemia of CKD/sickle cell- Hg^8.1 s/p 1 unit pRBC 10/17, total 3units during admission. Transfusion threshold 6 per PMD.  On aranesp 200 qTues, IV iron ordered.- trending down again  8. Secondary hyperparathyroidism - CCa and phos in goal.  Resume binders when diet advanced 9. Elevated LFT's - GI consulting 6. Nutrition - Currently NPO. Renal diet w/fluid restriction when advanced. Alb 2.6 - add protein supplements when diet advanced  [place] 04/07/2022,9:02 AM  LOS: 6 days

## 2022-04-07 NOTE — Progress Notes (Signed)
Received patient in bed to unit.  Alert and oriented.  Informed consent signed and in chart.   Treatment initiated: 0809 Treatment completed: 1217  Patient tolerated well.  Transported back to the room  Alert, without acute distress.  Hand-off given to patient's nurse.   Access used: Fistula Access issues: none  Total UF removed: 1000 Medication(s) given: Hectorol, Albumin25g, Oxy Post HD VS: 97.7, 100/67(76), HR-89, RR-16, SP02-99 Post HD weight: 61kg   [facility]

## 2022-04-07 NOTE — Procedures (Signed)
Patient was seen on dialysis and the procedure was supervised.  BFR 350  Via AVF BP is  96/67.   Patient appears to be tolerating treatment well-  have decreased goal   Louis Meckel 04/07/2022

## 2022-04-07 NOTE — Progress Notes (Addendum)
PROGRESS NOTE    Patrick [name]  ION:[id number] DOB: Aug 14, 1955 DOA: 03/31/2022 PCP: [name], MD   Brief Narrative:  66 year old male with history of blindness, sickle cell/thalassemia followed by sickle cell service, chronic pain syndrome, former smoker, end-stage renal disease on hemodialysis, permanent A-fib not on anticoagulation due to history of GI bleeding from AVMs treated with APC in 07/2021, nonischemic cardiomyopathy, small bowel resection for ischemic colitis in 05/2021, recurrent MRSA gastritis requiring admission in 06/2021 and was found to have Candida, Eliquis subsequently discontinued completely; small bowel obstruction requiring admission in 08/2021 managed conservatively presented with worsening pain, cough, decreased appetite and abdominal pain.  He was found to have low hemoglobin with mildly elevated LFTs, BNP of 3312, high-sensitivity troponin 427 and 378.  CT abdomen and pelvis was negative for PE.  Cardiology was consulted, patient was managed conservatively.  He received blood transfusion.  CT of abdomen had shown cystic mass in the abdomen.  GI was consulted.  He underwent EGD on 04/02/2022 which showed cystic lesion/?  Pancreatic pseudocyst, FNAC performed.  Echo showed EF of 40 to 45%.  He underwent ERCP on 04/04/2022.  He underwent aspiration of the cyst by IR on 04/06/2022.  Assessment & Plan:   Abdominal pain/upper abdominal cyst?  Pancreatic pseudocyst Elevated LFTs -Underwent EGD and FNAC of the cyst: GI following.  Status post ERCP and biliary stent placed on 04/04/2022 by GI. -Currently on oral ciprofloxacin. -Status post aspiration of the upper intra-abdominal cyst on 04/06/2022 by IR.  Follow further recommendations from GI and IR.  Acute on chronic anemia of chronic disease -Has required 3 units packed red cell transfusion so far during this hospitalization, most recently on 04/04/2022 for hemoglobin of 6.7.  Hemoglobin 8.1 on 04/06/2022.   Monitor H&H intermittently.  Elevated troponins Severe mitral and tricuspid regurgitation Chronic systolic heart failure -Patient was evaluated by cardiology who has already signed off.  Outpatient follow-up with cardiology.  Currently not in any chest pain.  Echo showed EF of 40 to 45%. -Volume being managed by hemodialysis and patient is still on torsemide. -Strict input and output.  Daily weights.  Fluid restriction.  Permanent A-fib -Mostly rate controlled.  Not a candidate for anticoagulation  History of hemorrhagic gastritis -Continue Protonix twice a day  Sickle cell anemia/thalassemia -Sickle cell service aware of the patient: They are available to assist if pain needs arise. -Continue hydroxyurea.  Continue current pain medication regimen.  End-stage renal disease on hemodialysis -Nephrology following.  Dialysis as per nephrology schedule  Hyponatremia -Improved.  Hypokalemia -No labs today  Macrocytosis -Questionable cause.  DVT prophylaxis: SCDs Code Status: Full Family Communication: None at bedside Disposition Plan: Status is: Inpatient Remains inpatient appropriate because: Of severity of illness.     Consultants: GI/nephrology  Procedures: As above ERCP on 04/04/2022 Impression:               - Apparent prior sphincterotomy.                           - Prior cholecystectomy.                           - Normal appearing post cholecystectomy                            cholangiogram.                           -  Sphinctertomy extended.                           - Biliary stent placed.                           - Gastric antrum ulcer.                           - Diffuse erosive gastritis.                           - Marked extrinsic gastric compression. Recommendation:           - Return patient to hospital ward for ongoing care.                           - Observe patient's clinical course following                            today's ERCP with  therapeutic intervention.                           - Erosive gastritis is the suspected source of heme                            positive stool, anemia.   aspiration of the upper intra-abdominal cyst on 04/06/2022 by IR    Antimicrobials: Ciprofloxacin from 04/02/2022 onwards   Subjective: Patient seen and examined at bedside undergoing hemodialysis.  Poor historian.  Does not feel well.  Complains of back and abdominal pain.  No vomiting, fever, agitation reported.   Objective: Vitals:   04/06/22 2200 04/07/22 0500 04/07/22 0543 04/07/22 0805  BP:   95/63   Pulse:   86   Resp:   17 16  Temp:   98.4 F (36.9 C)   TempSrc:      SpO2:   100%   Weight: 62.2 kg 62.2 kg    Height:        Intake/Output Summary (Last 24 hours) at 04/07/2022 0815 Last data filed at 04/07/2022 0659 Gross per 24 hour  Intake 957 ml  Output 0 ml  Net 957 ml    Filed Weights   04/06/22 0525 04/06/22 2200 04/07/22 0500  Weight: 61.7 kg 62.2 kg 62.2 kg    Examination:  General: On room air.  No distress currently.  Looks chronically ill and deconditioned. ENT/neck: No obvious thyromegaly or palpable JVD noted respiratory: Decreased breath sounds at bases bilaterally with some crackles CVS: Mild intermittent tachycardia present; S1 and S2 are heard abdominal: Soft, has some tenderness and diffuse distention ; no organomegaly, bowel sounds heard Extremities: Bilateral lower extremity edema present; no cyanosis  CNS: Awake and alert.  Slow to respond.  Poor historian.  No focal neurologic deficit.  Able to move extremities  lymph: No cervical lymphadenopathy palpable skin: No obvious ecchymosis/lesions  psych: No signs of agitation currently.  Flat affect mostly musculoskeletal: No obvious joint erythema/swelling   Data Reviewed: I have personally reviewed following labs and imaging studies  CBC: Recent Labs  Lab 03/31/22 1427 03/31/22 1731 04/01/22 1158 04/02/22 0347 04/03/22 0745  04/04/22 0246 04/04/22 1454 04/05/22 0836 04/05/22  2356  WBC 7.5  --  7.8 8.4 9.1 9.4  --  9.8 5.5  NEUTROABS 6.2  --  5.9  --  7.8* 6.8  --   --   --   HGB 6.3*   < > 6.1* 6.8* 6.1* 6.7* 8.5* 8.0* 8.1*  HCT 16.9*   < > 16.4* 18.2* 16.2* 18.7* 23.7* 22.0* 22.0*  MCV 111.2*  --  113.1* 105.8* 105.9* 100.5*  --  95.7 96.9  PLT 191  --  174 166 164 170  --  171 155   < > = values in this interval not displayed.    Basic Metabolic Panel: Recent Labs  Lab 04/01/22 2156 04/02/22 0347 04/03/22 0320 04/04/22 0246 04/05/22 0836 04/05/22 2356  NA  --  132* 131* 137 135 135  K  --  5.1 4.4 3.9 3.8 3.0*  CL  --  90* 91* 96* 97* 95*  CO2  --  19* 20* 24 24 26   GLUCOSE  --  78 106* 94 105* 118*  BUN  --  63* 80* 34* 51* 26*  CREATININE  --  10.00* 10.75* 5.62* 7.36* 4.57*  CALCIUM  --  8.7* 8.0* 7.8* 8.0* 7.5*  MG  --   --   --   --   --  1.6*  PHOS 6.4* 6.9* 6.9*  --  4.4  --     GFR: Estimated Creatinine Clearance: 14.4 mL/min (A) (by C-G formula based on SCr of 4.57 mg/dL (H)). Liver Function Tests: Recent Labs  Lab 03/31/22 1427 04/02/22 0347 04/03/22 0320 04/05/22 0836 04/05/22 2356  AST 79*  --   --   --  25  ALT 28  --   --   --  14  ALKPHOS 487*  --   --   --  334*  BILITOT 2.2*  --   --   --  1.9*  PROT 8.1  --   --   --  6.5  ALBUMIN 3.8 2.7* 2.6* 2.8* 2.6*    Recent Labs  Lab 03/31/22 1427  LIPASE 93*    No results for input(s): "AMMONIA" in the last 168 hours. Coagulation Profile: Recent Labs  Lab 03/31/22 1427 04/05/22 2356  INR 1.4* 1.5*    Cardiac Enzymes: No results for input(s): "CKTOTAL", "CKMB", "CKMBINDEX", "TROPONINI" in the last 168 hours. BNP (last 3 results) No results for input(s): "PROBNP" in the last 8760 hours. HbA1C: No results for input(s): "HGBA1C" in the last 72 hours. CBG: No results for input(s): "GLUCAP" in the last 168 hours. Lipid Profile: No results for input(s): "CHOL", "HDL", "LDLCALC", "TRIG", "CHOLHDL",  "LDLDIRECT" in the last 72 hours. Thyroid Function Tests: No results for input(s): "TSH", "T4TOTAL", "FREET4", "T3FREE", "THYROIDAB" in the last 72 hours. Anemia Panel: No results for input(s): "VITAMINB12", "FOLATE", "FERRITIN", "TIBC", "IRON", "RETICCTPCT" in the last 72 hours. Sepsis Labs: Recent Labs  Lab 03/31/22 1716 04/01/22 1516 04/05/22 2356  PROCALCITON  --  >150.00  --   LATICACIDVEN 1.3  --  1.7     Recent Results (from the past 240 hour(s))  Resp Panel by RT-PCR (Flu A&B, Covid) Anterior Nasal Swab     Status: None   Collection Time: 03/31/22  2:27 PM   Specimen: Anterior Nasal Swab  Result Value Ref Range Status   SARS Coronavirus 2 by RT PCR NEGATIVE NEGATIVE Final    Comment: (NOTE) SARS-CoV-2 target nucleic acids are NOT DETECTED.  The SARS-CoV-2 RNA is generally detectable in upper  respiratory specimens during the acute phase of infection. The lowest concentration of SARS-CoV-2 viral copies this assay can detect is 138 copies/mL. A negative result does not preclude SARS-Cov-2 infection and should not be used as the sole basis for treatment or other patient management decisions. A negative result may occur with  improper specimen collection/handling, submission of specimen other than nasopharyngeal swab, presence of viral mutation(s) within the areas targeted by this assay, and inadequate number of viral copies(<138 copies/mL). A negative result must be combined with clinical observations, patient history, and epidemiological information. The expected result is Negative.  Fact Sheet for Patients:  EntrepreneurPulse.com.au  Fact Sheet for Healthcare Providers:  IncredibleEmployment.be  This test is no t yet approved or cleared by the [place] FDA and  has been authorized for detection and/or diagnosis of SARS-CoV-2 by FDA under an Emergency Use Authorization (EUA). This EUA will remain  in effect (meaning this test  can be used) for the duration of the COVID-19 declaration under Section 564(b)(1) of the Act, 21 U.S.C.section 360bbb-3(b)(1), unless the authorization is terminated  or revoked sooner.       Influenza A by PCR NEGATIVE NEGATIVE Final   Influenza B by PCR NEGATIVE NEGATIVE Final    Comment: (NOTE) The Xpert Xpress SARS-CoV-2/FLU/RSV plus assay is intended as an aid in the diagnosis of influenza from Nasopharyngeal swab specimens and should not be used as a sole basis for treatment. Nasal washings and aspirates are unacceptable for Xpert Xpress SARS-CoV-2/FLU/RSV testing.  Fact Sheet for Patients: EntrepreneurPulse.com.au  Fact Sheet for Healthcare Providers: IncredibleEmployment.be  This test is not yet approved or cleared by the [place] FDA and has been authorized for detection and/or diagnosis of SARS-CoV-2 by FDA under an Emergency Use Authorization (EUA). This EUA will remain in effect (meaning this test can be used) for the duration of the COVID-19 declaration under Section 564(b)(1) of the Act, 21 U.S.C. section 360bbb-3(b)(1), unless the authorization is terminated or revoked.  Performed at [facility], [address] [place], [place] [address]   Aerobic/Anaerobic Culture w Gram Stain (surgical/deep wound)     Status: None (Preliminary result)   Collection Time: 04/02/22  3:30 PM   Specimen: Wound  Result Value Ref Range Status   Specimen Description WOUND  Final   Special Requests ABDOMINAL CYST  Final   Gram Stain   Final    RARE WBC PRESENT, PREDOMINANTLY MONONUCLEAR NO ORGANISMS SEEN    Culture   Final    NO GROWTH 4 DAYS NO ANAEROBES ISOLATED; CULTURE IN PROGRESS FOR 5 DAYS Performed at [facility] [address] [place], [place] [address]    Report Status PENDING  Incomplete  Aerobic/Anaerobic Culture w Gram Stain (surgical/deep wound)     Status: None (Preliminary result)    Collection Time: 04/06/22  1:06 PM   Specimen: Abdomen; Body Fluid  Result Value Ref Range Status   Specimen Description ABDOMEN  Final   Special Requests NONE  Final   Gram Stain   Final    NO WBC SEEN NO ORGANISMS SEEN CYTOSPIN SMEAR Performed at [facility], [address] [place], [place] [address]    Culture PENDING  Incomplete   Report Status PENDING  Incomplete         Radiology Studies: CT GUIDED NEEDLE PLACEMENT  Result Date: 04/06/2022 INDICATION: Intra-abdominal fluid collection EXAM: CT-guided aspiration of intra-abdominal fluid collection TECHNIQUE: Multidetector CT imaging of the abdomen was performed following the standard  protocol without IV contrast. RADIATION DOSE REDUCTION: This exam was performed according to the departmental dose-optimization program which includes automated exposure control, adjustment of the mA and/or kV according to patient size and/or use of iterative reconstruction technique. MEDICATIONS: The patient is currently admitted to the hospital and receiving intravenous antibiotics. The antibiotics were administered within an appropriate time frame prior to the initiation of the procedure. ANESTHESIA/SEDATION: Local analgesia COMPLICATIONS: None immediate. PROCEDURE: Informed written consent was obtained from the patient after a thorough discussion of the procedural risks, benefits and alternatives. All questions were addressed. Maximal Sterile Barrier Technique was utilized including caps, mask, sterile gowns, sterile gloves, sterile drape, hand hygiene and skin antiseptic. A timeout was performed prior to the initiation of the procedure. The patient was placed supine on the exam table. Limited CT of the abdomen was performed for planning purposes. This demonstrated large fluid collection in the left hemiabdomen. Skin entry site was marked, and the overlying skin was prepped and draped in the standard sterile fashion. Local analgesia was obtained  with 1% lidocaine. Using intermittent CT fluoroscopy, a 19 gauge Yueh catheter was advanced into the fluid collection. Location was confirmed with return of thin, brown fluid. A total of 3.25 L was aspirated. Samples were sent to the lab for analysis. Postprocedure CT demonstrated appropriate decompression of the fluid cavity. A clean dressing was placed after hemostasis. The patient tolerated the procedure well without immediate complication. IMPRESSION: Successful CT-guided aspiration of left abdominal fluid collection with removal of 3.25 L of thin brown fluid. Electronically Signed   By: Albin Felling M.D.   On: 04/06/2022 13:01        Scheduled Meds:  (feeding supplement) PROSource Plus  30 mL Oral TID BM   sodium chloride   Intravenous Once   sodium chloride   Intravenous Once   Chlorhexidine Gluconate Cloth  6 each Topical Q0600   cinacalcet  30 mg Oral Q T,Th,Sa-HD   darbepoetin (ARANESP) injection - DIALYSIS  200 mcg Intravenous Q Tue-HD   diclofenac  100 mg Rectal Once   diphenhydrAMINE  25 mg Oral Once   doxercalciferol  7 mcg Intravenous Q T,Th,Sa-HD   feeding supplement  1 Container Oral TID BM   hydroxyurea  500 mg Oral Daily   pantoprazole  40 mg Oral BID   sodium chloride flush  3 mL Intravenous Q12H   Continuous Infusions:  anticoagulant sodium citrate     ferric gluconate (FERRLECIT) IVPB 62.5 mg (04/03/22 0835)          Aline August, MD [facility] 04/07/2022, 8:15 AM

## 2022-04-07 NOTE — Progress Notes (Addendum)
[facility] Progress Note  CC:  Abdominal fluid collection   Subjective:  Feels a little better after 3 Liters were aspirated from abdominal fluid collection yesterday.  Had dialysis this AM and then ate entire lunch tray.  Still having pain in his back.  Objective:  Vital signs in last 24 hours: Temp:  [97.5 F (36.4 C)-98.4 F (36.9 C)] 97.5 F (36.4 C) (10/21 1357) Pulse Rate:  [69-94] 80 (10/21 1357) Resp:  [11-18] 18 (10/21 1357) BP: (85-102)/(46-71) 102/70 (10/21 1357) SpO2:  [95 %-100 %] 100 % (10/21 1357) Weight:  [61 kg-62.2 kg] 61 kg (10/21 1243) Last BM Date : 04/02/22 General:  Alert, Well-developed, in NAD Heart:  Regular rate and rhythm; no murmurs Pulm:  CTAB.  No W/R/R. Abdomen:  Soft, not nearly as tense and distended as previous.  BS present.  Mild TTP. Extremities:  Without edema. Neurologic:  Alert and oriented x 4;  grossly normal neurologically. Psych:  Alert and cooperative. Normal mood and affect.  Intake/Output from previous day: 10/20 0701 - 10/21 0700 In: 957 [P.O.:957] Out: 0  Intake/Output this shift: Total I/O In: -  Out: 1000 [Other:1000]  Lab Results: Recent Labs    04/05/22 0836 04/05/22 2356 04/07/22 0814  WBC 9.8 5.5 5.7  HGB 8.0* 8.1* 7.6*  HCT 22.0* 22.0* 21.3*  PLT 171 155 164   BMET Recent Labs    04/05/22 0836 04/05/22 2356 04/07/22 0814  NA 135 135 134*  K 3.8 3.0* 3.6  CL 97* 95* 97*  CO2 24 26 25   GLUCOSE 105* 118* 121*  BUN 51* 26* 44*  CREATININE 7.36* 4.57* 7.06*  CALCIUM 8.0* 7.5* 8.0*   LFT Recent Labs    04/05/22 2356 04/07/22 0814  PROT 6.5  --   ALBUMIN 2.6* 2.4*  AST 25  --   ALT 14  --   ALKPHOS 334*  --   BILITOT 1.9*  --   BILIDIR 0.9*  --    PT/INR Recent Labs    04/05/22 2356  LABPROT 17.7*  INR 1.5*   CT GUIDED NEEDLE PLACEMENT  Result Date: 04/06/2022 INDICATION: Intra-abdominal fluid collection EXAM: CT-guided aspiration of intra-abdominal fluid  collection TECHNIQUE: Multidetector CT imaging of the abdomen was performed following the standard protocol without IV contrast. RADIATION DOSE REDUCTION: This exam was performed according to the departmental dose-optimization program which includes automated exposure control, adjustment of the mA and/or kV according to patient size and/or use of iterative reconstruction technique. MEDICATIONS: The patient is currently admitted to the hospital and receiving intravenous antibiotics. The antibiotics were administered within an appropriate time frame prior to the initiation of the procedure. ANESTHESIA/SEDATION: Local analgesia COMPLICATIONS: None immediate. PROCEDURE: Informed written consent was obtained from the patient after a thorough discussion of the procedural risks, benefits and alternatives. All questions were addressed. Maximal Sterile Barrier Technique was utilized including caps, mask, sterile gowns, sterile gloves, sterile drape, hand hygiene and skin antiseptic. A timeout was performed prior to the initiation of the procedure. The patient was placed supine on the exam table. Limited CT of the abdomen was performed for planning purposes. This demonstrated large fluid collection in the left hemiabdomen. Skin entry site was marked, and the overlying skin was prepped and draped in the standard sterile fashion. Local analgesia was obtained with 1% lidocaine. Using intermittent CT fluoroscopy, a 19 gauge Yueh catheter was advanced into the fluid collection. Location was confirmed with return of thin, brown fluid. A total of  3.25 L was aspirated. Samples were sent to the lab for analysis. Postprocedure CT demonstrated appropriate decompression of the fluid cavity. A clean dressing was placed after hemostasis. The patient tolerated the procedure well without immediate complication. IMPRESSION: Successful CT-guided aspiration of left abdominal fluid collection with removal of 3.25 L of thin brown fluid.  Electronically Signed   By: Albin Felling M.D.   On: 04/06/2022 13:01    Assessment / Plan: Upper abdominal cyst w/ mass effect to adjacent organs.. 04/02/2022 EGD showing small hiatal hernia.  Irregular Z-line.  Extrinsic compression to the stomach.  Presence of previously placed endoclips in the gastric body.  Erosive gastropathy with out bleeding.  Extrinsic duodenal deformity.  Appearance suggesting prior sphincterotomy at major papilla.  Biopsies obtained of the stomach. 04/02/2022 EUS.  Cystic lesion seen in the abdominal cavity/peripancreatic region, FNA cytology obtained and pending.  Appearance suspicious for pancreatic pseudocyst versus biloma.  Gastric biopsies ok.  So far cultures without growth.  Cytology without malignant cells.  Lipase on fluid is 37. Bilirubin fluid result is still pending.  Had 3.25 L aspirated from the abdominal fluid collection on 10/20-bili and lipase were checked on the fluid but lab is able to add them on so I ordered those.   ESRD on HD TTS   Elevated LFTs, T. bili fluctuating, alk Phos rising, AST rising, ALT consistently normal.  Choledocholithiasis hx ERCP sphincterotomy, stone extraction, pancreatic and CBD stent placement 05/2021.  Lap chole 05/2021. HCV negative, HBV immune.  LFTs from yesterday have improved compared to 6 days ago. ERCP 10/18 with sphincterotomy extension, biliary stent placement.    Anemia. Hgb 7.8.. 6.1.. 6.8.. 6.1..6.7.Marland Kitchenone unit PRBCs and up to 8.5 grams, then 8.1 grams 10/19 and now 7.6 grams today.  FOBT positive.  EGD as above on 10/16.   Gastric ulcer, erosive gastritis likely cause of FOBT + stool. Continue pantoprazole 40 mg po bid.  -Trend labs.  Follow-up lipase and bili levels on fluid. -Follow-up ERCP with Dr. Rush Landmark has been scheduled for 11/30.  Will arrange for office visit with him prior to that as well.    LOS: 6 days   Laban Emperor. Zehr  04/07/2022, 2:31 PM    Attending Physician Note   I have taken an  interval history, reviewed the chart and examined the patient. I performed a substantive portion of this encounter, including complete performance of at least one of the key components, in conjunction with the APP. I agree with the APP's note, impression and recommendations. OK for discharge from GI standpoint. GI signing off. Outpatient GI follow up as above.   Lucio Edward, MD [facility] See AMION, [facility], for our on call provider

## 2022-04-07 NOTE — Plan of Care (Signed)

## 2022-04-08 ENCOUNTER — Encounter (HOSPITAL_COMMUNITY): Payer: Self-pay | Admitting: Gastroenterology

## 2022-04-08 DIAGNOSIS — N186 End stage renal disease: Secondary | ICD-10-CM | POA: Diagnosis not present

## 2022-04-08 DIAGNOSIS — R188 Other ascites: Secondary | ICD-10-CM | POA: Diagnosis not present

## 2022-04-08 DIAGNOSIS — D5700 Hb-SS disease with crisis, unspecified: Secondary | ICD-10-CM | POA: Diagnosis not present

## 2022-04-08 DIAGNOSIS — I5022 Chronic systolic (congestive) heart failure: Secondary | ICD-10-CM | POA: Diagnosis not present

## 2022-04-08 MED ORDER — FOLIC ACID 1 MG PO TABS: 1.0000 mg | ORAL_TABLET | Freq: Every day | ORAL | 0 refills | Status: DC

## 2022-04-08 MED ORDER — VITAMIN D (ERGOCALCIFEROL) 1.25 MG (50000 UNIT) PO CAPS: 50000.0000 [IU] | ORAL_CAPSULE | ORAL | 0 refills | Status: DC

## 2022-04-08 NOTE — Progress Notes (Addendum)
[facility] Progress Note   Subjective:    Seen and examined patient at bedside. Patient seen sitting up in bed eating breakfast. No acute complaints. Noted low Bps during yesterday's HD requiring IV Albumin-net UF 1L. Next HD 04/10/22.  Patient trying to reposition in bed to get comfortable   Objective Vitals:   04/07/22 1655 04/07/22 2056 04/08/22 0500 04/08/22 0500  BP: (!) 106/55 (!) 96/58  (!) 110/96  Pulse: 72 82  77  Resp: 17 17  17   Temp: 98.4 F (36.9 C) 98 F (36.7 C)  98.4 F (36.9 C)  TempSrc: Oral Oral    SpO2: 100% 100%  99%  Weight:   61.2 kg   Height:       Physical Exam General:chronically ill appearing male in NAD Heart:RRR, no mrg Lungs:CTAB, nml WOB on RA Abdomen:soft, mildly distended, +tenderness Extremities:no LE edema Dialysis Access: RU AVF +b/t   Filed Weights   04/07/22 0800 04/07/22 1243 04/08/22 0500  Weight: 62.2 kg 61 kg 61.2 kg    Intake/Output Summary (Last 24 hours) at 04/08/2022 0822 Last data filed at 04/08/2022 0507 Gross per 24 hour  Intake 957 ml  Output 1100 ml  Net -143 ml    Additional Objective Labs: Basic Metabolic Panel: Recent Labs  Lab 04/03/22 0320 04/04/22 0246 04/05/22 0836 04/05/22 2356 04/07/22 0814  NA 131*   < > 135 135 134*  K 4.4   < > 3.8 3.0* 3.6  CL 91*   < > 97* 95* 97*  CO2 20*   < > 24 26 25   GLUCOSE 106*   < > 105* 118* 121*  BUN 80*   < > 51* 26* 44*  CREATININE 10.75*   < > 7.36* 4.57* 7.06*  CALCIUM 8.0*   < > 8.0* 7.5* 8.0*  PHOS 6.9*  --  4.4  --  4.8*   < > = values in this interval not displayed.   Liver Function Tests: Recent Labs  Lab 04/05/22 0836 04/05/22 2356 04/07/22 0814  AST  --  25  --   ALT  --  14  --   ALKPHOS  --  334*  --   BILITOT  --  1.9*  --   PROT  --  6.5  --   ALBUMIN 2.8* 2.6* 2.4*   No results for input(s): "LIPASE", "AMYLASE" in the last 168 hours. CBC: Recent Labs  Lab 04/01/22 1158 04/02/22 0347 04/03/22 0745 04/04/22 0246  04/04/22 1454 04/05/22 0836 04/05/22 2356 04/07/22 0814  WBC 7.8   < > 9.1 9.4  --  9.8 5.5 5.7  NEUTROABS 5.9  --  7.8* 6.8  --   --   --   --   HGB 6.1*   < > 6.1* 6.7*   < > 8.0* 8.1* 7.6*  HCT 16.4*   < > 16.2* 18.7*   < > 22.0* 22.0* 21.3*  MCV 113.1*   < > 105.9* 100.5*  --  95.7 96.9 95.9  PLT 174   < > 164 170  --  171 155 164   < > = values in this interval not displayed.   Blood Culture    Component Value Date/Time   SDES ABDOMEN 04/06/2022 1306   SPECREQUEST NONE 04/06/2022 1306   CULT  04/06/2022 1306    NO GROWTH < 24 HOURS Performed at [facility], [place] [address], [place], [place] [address]    REPTSTATUS PENDING 04/06/2022 [phone]  Cardiac Enzymes: No results for input(s): "CKTOTAL", "CKMB", "CKMBINDEX", "TROPONINI" in the last 168 hours. CBG: No results for input(s): "GLUCAP" in the last 168 hours. Iron Studies: No results for input(s): "IRON", "TIBC", "TRANSFERRIN", "FERRITIN" in the last 72 hours. Lab Results  Component Value Date   INR 1.5 (H) 04/05/2022   INR 1.4 (H) 03/31/2022   INR 1.4 (H) 08/06/2021   Studies/Results: CT GUIDED NEEDLE PLACEMENT  Result Date: 04/06/2022 INDICATION: Intra-abdominal fluid collection EXAM: CT-guided aspiration of intra-abdominal fluid collection TECHNIQUE: Multidetector CT imaging of the abdomen was performed following the standard protocol without IV contrast. RADIATION DOSE REDUCTION: This exam was performed according to the departmental dose-optimization program which includes automated exposure control, adjustment of the mA and/or kV according to patient size and/or use of iterative reconstruction technique. MEDICATIONS: The patient is currently admitted to the hospital and receiving intravenous antibiotics. The antibiotics were administered within an appropriate time frame prior to the initiation of the procedure. ANESTHESIA/SEDATION: Local analgesia COMPLICATIONS: None immediate. PROCEDURE: Informed written  consent was obtained from the patient after a thorough discussion of the procedural risks, benefits and alternatives. All questions were addressed. Maximal Sterile Barrier Technique was utilized including caps, mask, sterile gowns, sterile gloves, sterile drape, hand hygiene and skin antiseptic. A timeout was performed prior to the initiation of the procedure. The patient was placed supine on the exam table. Limited CT of the abdomen was performed for planning purposes. This demonstrated large fluid collection in the left hemiabdomen. Skin entry site was marked, and the overlying skin was prepped and draped in the standard sterile fashion. Local analgesia was obtained with 1% lidocaine. Using intermittent CT fluoroscopy, a 19 gauge Yueh catheter was advanced into the fluid collection. Location was confirmed with return of thin, brown fluid. A total of 3.25 L was aspirated. Samples were sent to the lab for analysis. Postprocedure CT demonstrated appropriate decompression of the fluid cavity. A clean dressing was placed after hemostasis. The patient tolerated the procedure well without immediate complication. IMPRESSION: Successful CT-guided aspiration of left abdominal fluid collection with removal of 3.25 L of thin brown fluid. Electronically Signed   By: Albin Felling M.D.   On: 04/06/2022 13:01    Medications:  ferric gluconate (FERRLECIT) IVPB 62.5 mg (04/03/22 0835)    (feeding supplement) PROSource Plus  30 mL Oral TID BM   sodium chloride   Intravenous Once   sodium chloride   Intravenous Once   Chlorhexidine Gluconate Cloth  6 each Topical Q0600   cinacalcet  30 mg Oral Q T,Th,Sa-HD   darbepoetin (ARANESP) injection - DIALYSIS  200 mcg Intravenous Q Tue-HD   diclofenac  100 mg Rectal Once   diphenhydrAMINE  25 mg Oral Once   doxercalciferol  7 mcg Intravenous Q T,Th,Sa-HD   feeding supplement  1 Container Oral TID BM   hydroxyurea  500 mg Oral Daily   pantoprazole  40 mg Oral BID   sodium  chloride flush  3 mL Intravenous Q12H    Dialysis Orders: SW TTS 4 h 15 min  400/500  64 kg  2/2.5 bath  P2 No Hep RUA AVF Mircera 225 q2wks, Due 10/17 Venofer 50 mg weekly Hectorol 7 ug iv tiw tts Sensipar 30 mg tiw po tts    Assessment/Plan: 1. Abd pain / N/V - CT abd showed large 25 cm cyst-like structure which may be causing pain. No N/V today. ERCP and stent with GI 10/18. s/p cyst aspiration on 10/20-  3.25 liters  !  2. ESRD - on HD TTS. Does not appear volume overloaded on exam. UF as tolerated. High k bath. Next HD 04/10/22-will be as outpatient. 3. Sickle Cell Pain Crisis - per primary team-  has dilaudid and oxy PRN 4. Elevated troponin - Per cardiology not ACS. Likely 2/2 ischemia d/t severe anemia. 5. Atrial fib - per primary team/cards.   6. BP/Volume - CXR and CT chest do not show signs of fluid overload. Does not appear volume overloaded on exam. Min UF w/HD.  7. Anemia of CKD/sickle cell- S/p 1 unit pRBC 10/17, total 3units during admission. Transfusion threshold 6 per PMD. On aranesp 200 qTues, IV iron ordered.- trending down again-now 7.6. 8. Secondary hyperparathyroidism - CCa and phos in goal.  Resume binders when diet advanced 9. Elevated LFT's - GI consulting. Per last note: scheduled f/u ERCP with Dr. Rush Landmark on 11/30. GI to arrange OV prior to procedure. 6. Nutrition - Currently NPO. Renal diet w/fluid restriction when advanced. Alb 2.6 - add protein supplements when diet advanced  Tobie Poet, NP [facility] 04/08/2022,8:22 AM  LOS: 7 days   Patient seen and examined, agree with above note with above modifications.  He is overall better, tells me plans are for discharge today.  He will resume outpatient dialysis on Tuesday.  Hemoglobin is really at his baseline Corliss Parish, MD 04/08/2022

## 2022-04-08 NOTE — Progress Notes (Signed)
DISCHARGE NOTE HOME Patrick [name] to be discharged Home per MD order. Discussed prescriptions and follow up appointments with the patient. Prescriptions given to patient; medication list explained in detail. Patient verbalized understanding.  Skin clean, dry and intact without evidence of skin break down, no evidence of skin tears noted. IV catheter discontinued intact. Site without signs and symptoms of complications. Dressing and pressure applied. Pt denies pain at the site currently. No complaints noted.  Patient free of lines, drains, and wounds.   An After Visit Summary (AVS) was printed and given to the patient. Patient escorted via wheelchair, and discharged home via private auto.  Anastasio Auerbach, RN

## 2022-04-08 NOTE — Discharge Summary (Signed)
Physician Discharge Summary  Patrick [name] HCW:[id number] DOB: Jul 10, 1955 DOA: 03/31/2022  PCP: [name], MD  Admit date: 03/31/2022 Discharge date: 04/08/2022  Admitted From: Home Disposition: Home  Recommendations for Outpatient Follow-up:  Follow up with PCP in 1 week with repeat CBC/BMP Outpatient follow-up with GI Outpatient follow-up with hemodialysis unit as scheduled Follow up in ED if symptoms worsen or new appear   Home Health: No Equipment/Devices: None  Discharge Condition: Stable CODE STATUS: Full Diet recommendation: Hemodialysis diet  Brief/Interim Summary: 66 year old male with history of blindness, sickle cell/thalassemia followed by sickle cell service, chronic pain syndrome, former smoker, end-stage renal disease on hemodialysis, permanent A-fib not on anticoagulation due to history of GI bleeding from AVMs treated with APC in 07/2021, nonischemic cardiomyopathy, small bowel resection for ischemic colitis in 05/2021, recurrent MRSA gastritis requiring admission in 06/2021 and was found to have Candida, Eliquis subsequently discontinued completely; small bowel obstruction requiring admission in 08/2021 managed conservatively presented with worsening pain, cough, decreased appetite and abdominal pain.  He was found to have low hemoglobin with mildly elevated LFTs, BNP of 3312, high-sensitivity troponin 427 and 378.  CT abdomen and pelvis was negative for PE.  Cardiology was consulted, patient was managed conservatively.  He received blood transfusion.  CT of abdomen had shown cystic mass in the abdomen.  GI was consulted.  He underwent EGD on 04/02/2022 which showed cystic lesion/?  Pancreatic pseudocyst, FNAC performed.  Echo showed EF of 40 to 45%.  He underwent ERCP on 04/04/2022.  He underwent aspiration of the cyst by IR on 04/06/2022.  Subsequently, he has tolerated diet and condition is improving.  GI has cleared him for discharge.  He will be discharged  home today with close outpatient follow-up with PCP, GI.  Discharge Diagnoses:   Abdominal pain/upper abdominal cyst?  Pancreatic pseudocyst Elevated LFTs -Underwent EGD and FNAC of the cyst: GI following.  Status post ERCP and biliary stent placed on 04/04/2022 by GI. -Status post aspiration of the upper intra-abdominal cyst on 04/06/2022 by IR and removal of 3.4 2.5 L fluid.  Subsequently, he has tolerated diet and condition is improving.  GI has cleared him for discharge.  He will be discharged home today with close outpatient follow-up with PCP, GI.   Acute on chronic anemia of chronic disease -Has required 3 units packed red cell transfusion so far during this hospitalization, most recently on 04/04/2022 for hemoglobin of 6.7.  Hemoglobin 7.6 on 04/07/2022.  Outpatient follow-up.  Elevated troponins Severe mitral and tricuspid regurgitation Chronic systolic heart failure -Patient was evaluated by cardiology who has already signed off.  Outpatient follow-up with cardiology.  Currently not in any chest pain.  Echo showed EF of 40 to 45%. -Volume being managed by hemodialysis and patient is still on torsemide. -Continue diet and fluid restriction.   Permanent A-fib -Mostly rate controlled.  Not a candidate for anticoagulation   History of hemorrhagic gastritis -Continue Protonix twice a day   Sickle cell anemia/thalassemia -Sickle cell service aware of the patient: They are available to assist if pain needs arise. -Continue hydroxyurea.  Continue current pain medication regimen.  Outpatient follow-up with PCP.   End-stage renal disease on hemodialysis -Nephrology following.  Dialysis as per nephrology schedule   Hyponatremia -Improved.   Hypokalemia -No labs today   Macrocytosis -Questionable cause.   Discharge Instructions  Discharge Instructions     Diet - low sodium heart healthy   Complete by: As directed  Increase activity slowly   Complete by: As directed        Allergies as of 04/08/2022   No Known Allergies      Medication List     STOP taking these medications    (feeding supplement) PROSource Plus liquid   fluticasone 50 MCG/ACT nasal spray Commonly known as: FLONASE       TAKE these medications    acetaminophen 500 MG tablet Commonly known as: TYLENOL Take 2,000 mg by mouth in the morning, at noon, and at bedtime.   allopurinol 100 MG tablet Commonly known as: ZYLOPRIM TAKE 1 TABLET BY MOUTH EVERY DAY   Auryxia 1 GM 210 MG(Fe) tablet Generic drug: ferric citrate Take 210 mg by mouth in the morning and at bedtime.   diphenhydramine-acetaminophen 25-500 MG Tabs tablet Commonly known as: TYLENOL PM Take 2 tablets by mouth at bedtime as needed (sleep/pain).   folic acid 1 MG tablet Commonly known as: FOLVITE Take 1 tablet (1 mg total) by mouth daily.   hydroxyurea 500 MG capsule Commonly known as: HYDREA Take 1 capsule (500 mg total) by mouth daily.   lidocaine-prilocaine cream Commonly known as: EMLA Apply 1 application. topically Every Tuesday,Thursday,and Saturday with dialysis.   MUCINEX COUGH & CHEST CONGEST PO Take 1 Capful by mouth 3 (three) times daily as needed (cough).   Oxycodone HCl 10 MG Tabs Take 1 tablet (10 mg total) by mouth every 6 (six) hours as needed. What changed:  when to take this reasons to take this   pantoprazole 40 MG tablet Commonly known as: PROTONIX TAKE 1 TABLET BY MOUTH TWICE A DAY   tiZANidine 4 MG tablet Commonly known as: ZANAFLEX TAKE 1/2 TABLET (2MG  TOTAL) BY MOUTH EVERY 8 HOURS AS NEEDED FOR MUSCLE SPASM What changed:  how much to take how to take this when to take this reasons to take this additional instructions   torsemide 20 MG tablet Commonly known as: DEMADEX Take 20 mg by mouth in the morning, at noon, in the evening, and at bedtime.   Vitamin D (Ergocalciferol) 1.25 MG (50000 UNIT) Caps capsule Commonly known as: DRISDOL Take 1 capsule  (50,000 Units total) by mouth once a week. Monday        Follow-up Information     [name], MD. Schedule an appointment as soon as possible for a visit in 1 week(s).   Specialty: Internal Medicine Contact information: [place] [address] [phone]                No Known Allergies  Consultations: GI/nephrology/IR   Procedures/Studies: CT GUIDED NEEDLE PLACEMENT  Result Date: 04/06/2022 INDICATION: Intra-abdominal fluid collection EXAM: CT-guided aspiration of intra-abdominal fluid collection TECHNIQUE: Multidetector CT imaging of the abdomen was performed following the standard protocol without IV contrast. RADIATION DOSE REDUCTION: This exam was performed according to the departmental dose-optimization program which includes automated exposure control, adjustment of the mA and/or kV according to patient size and/or use of iterative reconstruction technique. MEDICATIONS: The patient is currently admitted to the hospital and receiving intravenous antibiotics. The antibiotics were administered within an appropriate time frame prior to the initiation of the procedure. ANESTHESIA/SEDATION: Local analgesia COMPLICATIONS: None immediate. PROCEDURE: Informed written consent was obtained from the patient after a thorough discussion of the procedural risks, benefits and alternatives. All questions were addressed. Maximal Sterile Barrier Technique was utilized including caps, mask, sterile gowns, sterile gloves, sterile drape, hand hygiene and skin antiseptic. A timeout  was performed prior to the initiation of the procedure. The patient was placed supine on the exam table. Limited CT of the abdomen was performed for planning purposes. This demonstrated large fluid collection in the left hemiabdomen. Skin entry site was marked, and the overlying skin was prepped and draped in the standard sterile fashion. Local analgesia was obtained with 1% lidocaine. Using  intermittent CT fluoroscopy, a 19 gauge Yueh catheter was advanced into the fluid collection. Location was confirmed with return of thin, brown fluid. A total of 3.25 L was aspirated. Samples were sent to the lab for analysis. Postprocedure CT demonstrated appropriate decompression of the fluid cavity. A clean dressing was placed after hemostasis. The patient tolerated the procedure well without immediate complication. IMPRESSION: Successful CT-guided aspiration of left abdominal fluid collection with removal of 3.25 L of thin brown fluid. Electronically Signed   By: Albin Felling M.D.   On: 04/06/2022 13:01   DG ERCP  Result Date: 04/04/2022 CLINICAL DATA:  ERCP EXAM: ERCP TECHNIQUE: Multiple spot images obtained with the fluoroscopic device and submitted for interpretation post-procedure. FLUOROSCOPY: Refer to separate report COMPARISON:  None Available. FINDINGS: A total of 16 fluoroscopic spot images taken during ERCP are submitted for review. Images demonstrate scope overlying the upper abdomen with wire catheterization of the extrahepatic biliary tree. Contrast injection opacifies both the extrahepatic and intrahepatic biliary ducts. The patient is status post cholecystectomy with a cystic duct stump. Surgical clips are present over the gallbladder fossa. Intrahepatic biliary ducts are normal in caliber. There is slight prominence of the common bile duct. A plastic biliary stent is placed, and the scope was withdrawn. IMPRESSION: ERCP images as described. Refer to separate procedure report for full details. These images were submitted for radiologic interpretation only. Please see the procedural report for the amount of contrast and the fluoroscopy time utilized. Electronically Signed   By: Albin Felling M.D.   On: 04/04/2022 13:10   ECHOCARDIOGRAM COMPLETE  Result Date: 04/02/2022    ECHOCARDIOGRAM REPORT   Patient Name:   Patrick [name] Date of Exam: 04/02/2022 Medical Rec #:  [id number]            Height:       71.0 in Accession #:    [id number]          Weight:       144.0 lb Date of Birth:  [birth year]           BSA:          1.834 m Patient Age:    62 years            BP:           93/69 mmHg Patient Gender: M                   HR:           96 bpm. Exam Location:  Inpatient Procedure: 2D Echo, Color Doppler and Cardiac Doppler Indications:    elevated trops  History:        Patient has prior history of Echocardiogram examinations, most                 recent 05/23/2021. CHF, Arrythmias:Atrial Fibrillation,                 Signs/Symptoms:Chest Pain; Risk Factors:Hypertension.  Sonographer:    Melissa Morford RDCS (AE, PE) Referring Phys: [phone] RONDELL A SMITH IMPRESSIONS  1. Left ventricular ejection fraction, by estimation,  is 40 to 45%. The left ventricle has mildly decreased function. The left ventricle demonstrates global hypokinesis. The left ventricular internal cavity size was mildly to moderately dilated. Left ventricular diastolic parameters are indeterminate.  2. Right ventricular systolic function is mildly reduced. The right ventricular size is moderately-to-severely enlarged. There is moderately elevated pulmonary artery systolic pressure. The estimated right ventricular systolic pressure is 26.3 mmHg.  3. Left atrial size was severely dilated.  4. Right atrial size was severely dilated.  5. The mitral valve is abnormal. There is at least moderate, mainly posteriorly directed (although there are multiple MR jets) functional/secondary mitral regurgitation likely due to LA and LV dilation.  6. Tricuspid valve regurgitation is severe.  7. The aortic valve is tricuspid. There is mild calcification of the aortic valve. There is mild thickening of the aortic valve. Aortic valve regurgitation is not visualized. Aortic valve sclerosis/calcification is present, without any evidence of aortic stenosis.  8. The inferior vena cava is dilated in size with <50% respiratory variability, suggesting right  atrial pressure of 15 mmHg. Comparison(s): Compared to prior TTE on 05/2021, LVEF remains stable at 40-45%, MR appears slightly less on current study, there continues to be severe TR. FINDINGS  Left Ventricle: Left ventricular ejection fraction, by estimation, is 40 to 45%. The left ventricle has mildly decreased function. The left ventricle demonstrates global hypokinesis. The left ventricular internal cavity size was mildly to moderately dilated. There is no left ventricular hypertrophy. Left ventricular diastolic parameters are indeterminate. Right Ventricle: The right ventricular size is moderately-to-severely enlarged. No increase in right ventricular wall thickness. Right ventricular systolic function is mildly reduced. There is moderately elevated pulmonary artery systolic pressure. The tricuspid regurgitant velocity is 2.87 m/s, and with an assumed right atrial pressure of 15 mmHg, the estimated right ventricular systolic pressure is 78.5 mmHg. Left Atrium: Left atrial size was severely dilated. Right Atrium: Right atrial size was severely dilated. Pericardium: Trivial pericardial effusion is present. The pericardial effusion is circumferential. Mitral Valve: The mitral valve is abnormal. There is mild thickening of the mitral valve leaflet(s). There is at least moderate mainly posteriorly directed (although there are multiple MR jets) functional/secondary mitral regurgitation likely due to LA and LV dilation. mitral valve regurgitation. Tricuspid Valve: The tricuspid valve is normal in structure. Tricuspid valve regurgitation is severe. Aortic Valve: The aortic valve is tricuspid. There is mild calcification of the aortic valve. There is mild thickening of the aortic valve. Aortic valve regurgitation is not visualized. Aortic valve sclerosis/calcification is present, without any evidence of aortic stenosis. Pulmonic Valve: The pulmonic valve was normal in structure. Pulmonic valve regurgitation is trivial.  Aorta: The aortic root and ascending aorta are structurally normal, with no evidence of dilitation. Venous: The inferior vena cava is dilated in size with less than 50% respiratory variability, suggesting right atrial pressure of 15 mmHg. IAS/Shunts: The atrial septum is grossly normal.  LEFT VENTRICLE PLAX 2D LVIDd:         6.50 cm      Diastology LVIDs:         5.00 cm      LV e' medial:    10.00 cm/s LV PW:         1.10 cm      LV E/e' medial:  10.3 LV IVS:        1.00 cm      LV e' lateral:   14.10 cm/s LVOT diam:     2.20 cm  LV E/e' lateral: 7.3 LV SV:         59 LV SV Index:   32 LVOT Area:     3.80 cm  LV Volumes (MOD) LV vol d, MOD A2C: 138.0 ml LV vol d, MOD A4C: 103.0 ml LV vol s, MOD A2C: 82.8 ml LV vol s, MOD A4C: 71.6 ml LV SV MOD A2C:     55.2 ml LV SV MOD A4C:     103.0 ml LV SV MOD BP:      39.3 ml RIGHT VENTRICLE RV S prime:     11.60 cm/s TAPSE (M-mode): 1.9 cm LEFT ATRIUM              Index        RIGHT ATRIUM           Index LA diam:        6.50 cm  3.54 cm/m   RA Area:     31.40 cm LA Vol (A2C):   124.0 ml 67.63 ml/m  RA Volume:   112.00 ml 61.08 ml/m LA Vol (A4C):   130.0 ml 70.90 ml/m LA Biplane Vol: 133.0 ml 72.54 ml/m  AORTIC VALVE LVOT Vmax:   86.70 cm/s LVOT Vmean:  58.500 cm/s LVOT VTI:    0.156 m  AORTA Ao Root diam: 3.50 cm Ao Asc diam:  3.40 cm MITRAL VALVE                TRICUSPID VALVE MV Area (PHT): 3.50 cm     TR Peak grad:   32.9 mmHg MV Decel Time: 217 msec     TR Vmax:        287.00 cm/s MV E velocity: 103.00 cm/s MV A velocity: 36.10 cm/s   SHUNTS MV E/A ratio:  2.85         Systemic VTI:  0.16 m                             Systemic Diam: 2.20 cm Gwyndolyn Kaufman MD Electronically signed by Gwyndolyn Kaufman MD Signature Date/Time: 04/02/2022/12:16:30 PM    Final    CT Abdomen Pelvis W Contrast  Result Date: 03/31/2022 CLINICAL DATA:  Cough, sickle cell crisis, abdominal pain. EXAM: CT ANGIOGRAPHY CHEST CT ABDOMEN AND PELVIS WITH CONTRAST TECHNIQUE:  Multidetector CT imaging of the chest was performed using the standard protocol during bolus administration of intravenous contrast. Multiplanar CT image reconstructions and MIPs were obtained to evaluate the vascular anatomy. Multidetector CT imaging of the abdomen and pelvis was performed using the standard protocol during bolus administration of intravenous contrast. RADIATION DOSE REDUCTION: This exam was performed according to the departmental dose-optimization program which includes automated exposure control, adjustment of the mA and/or kV according to patient size and/or use of iterative reconstruction technique. CONTRAST:  137mL OMNIPAQUE IOHEXOL 300 MG/ML  SOLN COMPARISON:  Chest radiograph dated 03/31/2022. CTA chest dated 08/29/2021. FINDINGS: CTA CHEST FINDINGS Cardiovascular: Satisfactory opacification of the bilateral pulmonary arteries to the segmental level. No evidence of pulmonary embolism. Study is not tailored for evaluation of the thoracic aorta. No evidence of thoracic aortic aneurysm or dissection. Cardiomegaly.  No pericardial effusion. Mediastinum/Nodes: No suspicious mediastinal lymphadenopathy. Visualized thyroid is unremarkable. Lungs/Pleura: Evaluation of the lung parenchyma is constrained by respiratory motion. Within that constraint, there are no suspicious pulmonary nodules. Mild patchy bilateral lower lobe opacities with mild dependent left lower lobe opacity, likely atelectasis. No focal consolidation. No frank  interstitial edema. Mild centrilobular emphysematous changes, upper lung predominant. Trace bilateral pleural effusions. No pneumothorax. Musculoskeletal: Vertebral body changes related to sickle cell disease. No focal osseous lesions. Review of the MIP images confirms the above findings. CT ABDOMEN and PELVIS FINDINGS Hepatobiliary: Liver is notable for changes of hepatic congestion. Status post cholecystectomy. No intrahepatic or extrahepatic ductal dilatation. Pancreas:  Mild parenchymal atrophy. 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 Adrenals/Urinary Tract: Adrenal glands are within normal limits. Bilateral renal cysts, measuring up to 13 mm in the left upper kidney (series 6/image 22), measuring tubal low-density, benign (Bosniak I). No follow-up is recommended. No hydronephrosis. Bladder is underdistended but unremarkable. Stomach/Bowel: Mass effect on the stomach by the dominant upper abdominal bilobed cyst (described below). Surgical clips in the stomach (series 6/image 45). No evidence of bowel obstruction. Normal appendix (series 6/image 49). Suture line in the right lower abdomen (series 6/image 58). No colonic wall thickening or inflammatory changes. Vascular/Lymphatic: No evidence of abdominal aortic aneurysm. Atherosclerotic calcifications of the abdominal aorta and branch vessels. No suspicious abdominopelvic lymphadenopathy. Reproductive: Prostate is unremarkable. Other: 15.3 x 25.6 x 19.0 cm bilobed cyst in the central upper abdomen (series 6/image 32), most associated with the liver and stomach on the current study, although statistically most commonly reflecting a pseudocyst, less likely a mesenteric cyst. This is new from the prior. Mass effect on adjacent organs. At the upper limits of normal for simple fluid density internally. No definite solid components or internal enhancement. Trace abdominopelvic ascites. Musculoskeletal: Vertebral body changes related to sickle cell disease. No focal osseous lesions. Review of the MIP images confirms the above findings. IMPRESSION: No evidence of pulmonary embolism. Cardiomegaly with trace bilateral pleural effusions. No frank interstitial edema. Mild bilateral lower lobe atelectasis. 25.6 cm bilobed cyst in the central upper abdomen, most commonly reflecting a pseudocyst, less likely a mesenteric cyst. This is new from the prior CT. Associated mass effect on adjacent organs. Consider EUS for cyst  aspiration/sampling via the stomach. Otherwise, no acute findings in the abdomen/pelvis. Aortic Atherosclerosis (ICD10-I70.0) and Emphysema (ICD10-J43.9). Electronically Signed   By: Julian Hy M.D.   On: 03/31/2022 17:30   CT Angio Chest PE W and/or Wo Contrast  Result Date: 03/31/2022 CLINICAL DATA:  Cough, sickle cell crisis, abdominal pain. EXAM: CT ANGIOGRAPHY CHEST CT ABDOMEN AND PELVIS WITH CONTRAST TECHNIQUE: Multidetector CT imaging of the chest was performed using the standard protocol during bolus administration of intravenous contrast. Multiplanar CT image reconstructions and MIPs were obtained to evaluate the vascular anatomy. Multidetector CT imaging of the abdomen and pelvis was performed using the standard protocol during bolus administration of intravenous contrast. RADIATION DOSE REDUCTION: This exam was performed according to the departmental dose-optimization program which includes automated exposure control, adjustment of the mA and/or kV according to patient size and/or use of iterative reconstruction technique. CONTRAST:  125mL OMNIPAQUE IOHEXOL 300 MG/ML  SOLN COMPARISON:  Chest radiograph dated 03/31/2022. CTA chest dated 08/29/2021. FINDINGS: CTA CHEST FINDINGS Cardiovascular: Satisfactory opacification of the bilateral pulmonary arteries to the segmental level. No evidence of pulmonary embolism. Study is not tailored for evaluation of the thoracic aorta. No evidence of thoracic aortic aneurysm or dissection. Cardiomegaly.  No pericardial effusion. Mediastinum/Nodes: No suspicious mediastinal lymphadenopathy. Visualized thyroid is unremarkable. Lungs/Pleura: Evaluation of the lung parenchyma is constrained by respiratory motion. Within that constraint, there are no suspicious pulmonary nodules. Mild patchy bilateral lower lobe opacities with mild dependent left lower lobe opacity, likely atelectasis. No focal  consolidation. No frank interstitial edema. Mild centrilobular  emphysematous changes, upper lung predominant. Trace bilateral pleural effusions. No pneumothorax. Musculoskeletal: Vertebral body changes related to sickle cell disease. No focal osseous lesions. Review of the MIP images confirms the above findings. CT ABDOMEN and PELVIS FINDINGS Hepatobiliary: Liver is notable for changes of hepatic congestion. Status post cholecystectomy. No intrahepatic or extrahepatic ductal dilatation. Pancreas: Mild parenchymal atrophy. 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 Adrenals/Urinary Tract: Adrenal glands are within normal limits. Bilateral renal cysts, measuring up to 13 mm in the left upper kidney (series 6/image 22), measuring tubal low-density, benign (Bosniak I). No follow-up is recommended. No hydronephrosis. Bladder is underdistended but unremarkable. Stomach/Bowel: Mass effect on the stomach by the dominant upper abdominal bilobed cyst (described below). Surgical clips in the stomach (series 6/image 45). No evidence of bowel obstruction. Normal appendix (series 6/image 49). Suture line in the right lower abdomen (series 6/image 58). No colonic wall thickening or inflammatory changes. Vascular/Lymphatic: No evidence of abdominal aortic aneurysm. Atherosclerotic calcifications of the abdominal aorta and branch vessels. No suspicious abdominopelvic lymphadenopathy. Reproductive: Prostate is unremarkable. Other: 15.3 x 25.6 x 19.0 cm bilobed cyst in the central upper abdomen (series 6/image 32), most associated with the liver and stomach on the current study, although statistically most commonly reflecting a pseudocyst, less likely a mesenteric cyst. This is new from the prior. Mass effect on adjacent organs. At the upper limits of normal for simple fluid density internally. No definite solid components or internal enhancement. Trace abdominopelvic ascites. Musculoskeletal: Vertebral body changes related to sickle cell disease. No focal osseous lesions.  Review of the MIP images confirms the above findings. IMPRESSION: No evidence of pulmonary embolism. Cardiomegaly with trace bilateral pleural effusions. No frank interstitial edema. Mild bilateral lower lobe atelectasis. 25.6 cm bilobed cyst in the central upper abdomen, most commonly reflecting a pseudocyst, less likely a mesenteric cyst. This is new from the prior CT. Associated mass effect on adjacent organs. Consider EUS for cyst aspiration/sampling via the stomach. Otherwise, no acute findings in the abdomen/pelvis. Aortic Atherosclerosis (ICD10-I70.0) and Emphysema (ICD10-J43.9). Electronically Signed   By: Julian Hy M.D.   On: 03/31/2022 17:30   DG Chest Port 1 View  Result Date: 03/31/2022 CLINICAL DATA:  Cough, sickle crisis EXAM: PORTABLE CHEST 1 VIEW COMPARISON:  12/21/2021 FINDINGS: Cardiomegaly. Both lungs are clear. The visualized skeletal structures are unremarkable. IMPRESSION: Cardiomegaly without acute abnormality of the lungs in AP portable projection. Electronically Signed   By: Delanna Ahmadi M.D.   On: 03/31/2022 15:50      Subjective: Patient seen and examined at bedside undergoing hemodialysis.  Feels okay to go home today.  No fever, vomiting, worsening shortness of breath reported. Discharge Exam: Vitals:   04/08/22 0500 04/08/22 0852  BP: (!) 110/96 100/74  Pulse: 77 90  Resp: 17 18  Temp: 98.4 F (36.9 C) 98 F (36.7 C)  SpO2: 99% 99%    General: Pt is alert, awake, not in acute distress.  Slow to respond.  Chronically ill and deconditioned looking.  On room air. Cardiovascular: rate controlled, S1/S2 + Respiratory: bilateral decreased breath sounds at bases Abdominal: Soft, mildly tender, slightly distended, bowel sounds + Extremities: Lower extremity edema; no cyanosis    The results of significant diagnostics from this hospitalization (including imaging, microbiology, ancillary and laboratory) are listed below for reference.      Microbiology: Recent Results (from the past 240 hour(s))  Resp Panel by RT-PCR (Flu A&B, Covid)  Anterior Nasal Swab     Status: None   Collection Time: 03/31/22  2:27 PM   Specimen: Anterior Nasal Swab  Result Value Ref Range Status   SARS Coronavirus 2 by RT PCR NEGATIVE NEGATIVE Final    Comment: (NOTE) SARS-CoV-2 target nucleic acids are NOT DETECTED.  The SARS-CoV-2 RNA is generally detectable in upper respiratory specimens during the acute phase of infection. The lowest concentration of SARS-CoV-2 viral copies this assay can detect is 138 copies/mL. A negative result does not preclude SARS-Cov-2 infection and should not be used as the sole basis for treatment or other patient management decisions. A negative result may occur with  improper specimen collection/handling, submission of specimen other than nasopharyngeal swab, presence of viral mutation(s) within the areas targeted by this assay, and inadequate number of viral copies(<138 copies/mL). A negative result must be combined with clinical observations, patient history, and epidemiological information. The expected result is Negative.  Fact Sheet for Patients:  EntrepreneurPulse.com.au  Fact Sheet for Healthcare Providers:  IncredibleEmployment.be  This test is no t yet approved or cleared by the [place] FDA and  has been authorized for detection and/or diagnosis of SARS-CoV-2 by FDA under an Emergency Use Authorization (EUA). This EUA will remain  in effect (meaning this test can be used) for the duration of the COVID-19 declaration under Section 564(b)(1) of the Act, 21 U.S.C.section 360bbb-3(b)(1), unless the authorization is terminated  or revoked sooner.       Influenza A by PCR NEGATIVE NEGATIVE Final   Influenza B by PCR NEGATIVE NEGATIVE Final    Comment: (NOTE) The Xpert Xpress SARS-CoV-2/FLU/RSV plus assay is intended as an aid in the diagnosis of influenza  from Nasopharyngeal swab specimens and should not be used as a sole basis for treatment. Nasal washings and aspirates are unacceptable for Xpert Xpress SARS-CoV-2/FLU/RSV testing.  Fact Sheet for Patients: EntrepreneurPulse.com.au  Fact Sheet for Healthcare Providers: IncredibleEmployment.be  This test is not yet approved or cleared by the [place] FDA and has been authorized for detection and/or diagnosis of SARS-CoV-2 by FDA under an Emergency Use Authorization (EUA). This EUA will remain in effect (meaning this test can be used) for the duration of the COVID-19 declaration under Section 564(b)(1) of the Act, 21 U.S.C. section 360bbb-3(b)(1), unless the authorization is terminated or revoked.  Performed at [facility], [address] [place], [place] [address]   Aerobic/Anaerobic Culture w Gram Stain (surgical/deep wound)     Status: None   Collection Time: 04/02/22  3:30 PM   Specimen: Wound  Result Value Ref Range Status   Specimen Description WOUND  Final   Special Requests ABDOMINAL CYST  Final   Gram Stain   Final    RARE WBC PRESENT, PREDOMINANTLY MONONUCLEAR NO ORGANISMS SEEN    Culture   Final    No growth aerobically or anaerobically. Performed at [facility], [place] [address], [place], [place] [address]    Report Status 04/07/2022 FINAL  Final  Aerobic/Anaerobic Culture w Gram Stain (surgical/deep wound)     Status: None (Preliminary result)   Collection Time: 04/06/22  1:06 PM   Specimen: Abdomen; Body Fluid  Result Value Ref Range Status   Specimen Description ABDOMEN  Final   Special Requests NONE  Final   Gram Stain NO WBC SEEN NO ORGANISMS SEEN CYTOSPIN SMEAR   Final   Culture   Final    NO GROWTH 2 DAYS Performed at [facility], [address] [place], [place] [address]    Report Status PENDING  Incomplete     Labs: BNP (last 3 results) Recent Labs    03/31/22 1427   BNP 1,761.6*   Basic Metabolic Panel: Recent Labs  Lab 04/01/22 2156 04/02/22 0347 04/02/22 0347 04/03/22 0320 04/04/22 0246 04/05/22 0836 04/05/22 2356 04/07/22 0814  NA  --  132*   < > 131* 137 135 135 134*  K  --  5.1   < > 4.4 3.9 3.8 3.0* 3.6  CL  --  90*   < > 91* 96* 97* 95* 97*  CO2  --  19*   < > 20* 24 24 26 25   GLUCOSE  --  78   < > 106* 94 105* 118* 121*  BUN  --  63*   < > 80* 34* 51* 26* 44*  CREATININE  --  10.00*   < > 10.75* 5.62* 7.36* 4.57* 7.06*  CALCIUM  --  8.7*   < > 8.0* 7.8* 8.0* 7.5* 8.0*  MG  --   --   --   --   --   --  1.6*  --   PHOS 6.4* 6.9*  --  6.9*  --  4.4  --  4.8*   < > = values in this interval not displayed.   Liver Function Tests: Recent Labs  Lab 04/02/22 0347 04/03/22 0320 04/05/22 0836 04/05/22 2356 04/07/22 0814  AST  --   --   --  25  --   ALT  --   --   --  14  --   ALKPHOS  --   --   --  334*  --   BILITOT  --   --   --  1.9*  --   PROT  --   --   --  6.5  --   ALBUMIN 2.7* 2.6* 2.8* 2.6* 2.4*   No results for input(s): "LIPASE", "AMYLASE" in the last 168 hours. No results for input(s): "AMMONIA" in the last 168 hours. CBC: Recent Labs  Lab 04/01/22 1158 04/02/22 0347 04/03/22 0745 04/04/22 0246 04/04/22 1454 04/05/22 0836 04/05/22 2356 04/07/22 0814  WBC 7.8   < > 9.1 9.4  --  9.8 5.5 5.7  NEUTROABS 5.9  --  7.8* 6.8  --   --   --   --   HGB 6.1*   < > 6.1* 6.7* 8.5* 8.0* 8.1* 7.6*  HCT 16.4*   < > 16.2* 18.7* 23.7* 22.0* 22.0* 21.3*  MCV 113.1*   < > 105.9* 100.5*  --  95.7 96.9 95.9  PLT 174   < > 164 170  --  171 155 164   < > = values in this interval not displayed.   Cardiac Enzymes: No results for input(s): "CKTOTAL", "CKMB", "CKMBINDEX", "TROPONINI" in the last 168 hours. BNP: Invalid input(s): "POCBNP" CBG: No results for input(s): "GLUCAP" in the last 168 hours. D-Dimer No results for input(s): "DDIMER" in the last 72 hours. Hgb A1c No results for input(s): "HGBA1C" in the last 72  hours. Lipid Profile No results for input(s): "CHOL", "HDL", "LDLCALC", "TRIG", "CHOLHDL", "LDLDIRECT" in the last 72 hours. Thyroid function studies No results for input(s): "TSH", "T4TOTAL", "T3FREE", "THYROIDAB" in the last 72 hours.  Invalid input(s): "FREET3" Anemia work up No results for input(s): "VITAMINB12", "FOLATE", "FERRITIN", "TIBC", "IRON", "RETICCTPCT" in the last 72 hours. Urinalysis    Component Value Date/Time   COLORURINE STRAW (A)  06/02/2018 1900   APPEARANCEUR CLEAR 06/02/2018 1900   APPEARANCEUR Clear 05/31/2017 0902   LABSPEC 1.006 06/02/2018 1900   PHURINE 7.0 06/02/2018 1900   GLUCOSEU NEGATIVE 06/02/2018 1900   HGBUR SMALL (A) 06/02/2018 1900   BILIRUBINUR neg 01/26/2020 1124   BILIRUBINUR Negative 05/31/2017 0902   KETONESUR negative 09/08/2018 1621   KETONESUR NEGATIVE 06/02/2018 1900   PROTEINUR Positive (A) 01/26/2020 1124   PROTEINUR 30 (A) 06/02/2018 1900   UROBILINOGEN 1.0 01/26/2020 1124   UROBILINOGEN 0.2 09/14/2013 1354   NITRITE neg 01/26/2020 1124   NITRITE NEGATIVE 06/02/2018 1900   LEUKOCYTESUR Negative 01/26/2020 1124   LEUKOCYTESUR Negative 05/31/2017 0902   Sepsis Labs Recent Labs  Lab 04/04/22 0246 04/05/22 0836 04/05/22 2356 04/07/22 0814  WBC 9.4 9.8 5.5 5.7   Microbiology Recent Results (from the past 240 hour(s))  Resp Panel by RT-PCR (Flu A&B, Covid) Anterior Nasal Swab     Status: None   Collection Time: 03/31/22  2:27 PM   Specimen: Anterior Nasal Swab  Result Value Ref Range Status   SARS Coronavirus 2 by RT PCR NEGATIVE NEGATIVE Final    Comment: (NOTE) SARS-CoV-2 target nucleic acids are NOT DETECTED.  The SARS-CoV-2 RNA is generally detectable in upper respiratory specimens during the acute phase of infection. The lowest concentration of SARS-CoV-2 viral copies this assay can detect is 138 copies/mL. A negative result does not preclude SARS-Cov-2 infection and should not be used as the sole basis for  treatment or other patient management decisions. A negative result may occur with  improper specimen collection/handling, submission of specimen other than nasopharyngeal swab, presence of viral mutation(s) within the areas targeted by this assay, and inadequate number of viral copies(<138 copies/mL). A negative result must be combined with clinical observations, patient history, and epidemiological information. The expected result is Negative.  Fact Sheet for Patients:  EntrepreneurPulse.com.au  Fact Sheet for Healthcare Providers:  IncredibleEmployment.be  This test is no t yet approved or cleared by the [place] FDA and  has been authorized for detection and/or diagnosis of SARS-CoV-2 by FDA under an Emergency Use Authorization (EUA). This EUA will remain  in effect (meaning this test can be used) for the duration of the COVID-19 declaration under Section 564(b)(1) of the Act, 21 U.S.C.section 360bbb-3(b)(1), unless the authorization is terminated  or revoked sooner.       Influenza A by PCR NEGATIVE NEGATIVE Final   Influenza B by PCR NEGATIVE NEGATIVE Final    Comment: (NOTE) The Xpert Xpress SARS-CoV-2/FLU/RSV plus assay is intended as an aid in the diagnosis of influenza from Nasopharyngeal swab specimens and should not be used as a sole basis for treatment. Nasal washings and aspirates are unacceptable for Xpert Xpress SARS-CoV-2/FLU/RSV testing.  Fact Sheet for Patients: EntrepreneurPulse.com.au  Fact Sheet for Healthcare Providers: IncredibleEmployment.be  This test is not yet approved or cleared by the [place] FDA and has been authorized for detection and/or diagnosis of SARS-CoV-2 by FDA under an Emergency Use Authorization (EUA). This EUA will remain in effect (meaning this test can be used) for the duration of the COVID-19 declaration under Section 564(b)(1) of the Act, 21  U.S.C. section 360bbb-3(b)(1), unless the authorization is terminated or revoked.  Performed at [facility], [address], [place], [place] [address]   Aerobic/Anaerobic Culture w Gram Stain (surgical/deep wound)     Status: None   Collection Time: 04/02/22  3:30 PM   Specimen: Wound  Result Value Ref Range Status  Specimen Description WOUND  Final   Special Requests ABDOMINAL CYST  Final   Gram Stain   Final    RARE WBC PRESENT, PREDOMINANTLY MONONUCLEAR NO ORGANISMS SEEN    Culture   Final    No growth aerobically or anaerobically. Performed at [facility], [place] [address], [place], [place] [address]    Report Status 04/07/2022 FINAL  Final  Aerobic/Anaerobic Culture w Gram Stain (surgical/deep wound)     Status: None (Preliminary result)   Collection Time: 04/06/22  1:06 PM   Specimen: Abdomen; Body Fluid  Result Value Ref Range Status   Specimen Description ABDOMEN  Final   Special Requests NONE  Final   Gram Stain NO WBC SEEN NO ORGANISMS SEEN CYTOSPIN SMEAR   Final   Culture   Final    NO GROWTH 2 DAYS Performed at [facility], [address] [place], [place] [address]    Report Status PENDING  Incomplete     Time coordinating discharge: 35 minutes  SIGNED:   Aline August, MD  [facility] 04/08/2022, 11:03 AM

## 2022-04-08 NOTE — Plan of Care (Signed)
  Problem: Education: Goal: Knowledge of vaso-occlusive preventative measures will improve Outcome: Progressing Goal: Awareness of infection prevention will improve Outcome: Progressing Goal: Awareness of signs and symptoms of anemia will improve Outcome: Progressing Goal: Long-term complications will improve Outcome: Progressing   Problem: Self-Care: Goal: Ability to incorporate actions that prevent/reduce pain crisis will improve Outcome: Progressing   Problem: Bowel/Gastric: Goal: Gut motility will be maintained Outcome: Progressing   Problem: Tissue Perfusion: Goal: Complications related to inadequate tissue perfusion will be avoided or minimized Outcome: Progressing   Problem: Tissue Perfusion: Goal: Complications related to inadequate tissue perfusion will be avoided or minimized Outcome: Progressing   Problem: Respiratory: Goal: Pulmonary complications will be avoided or minimized Outcome: Progressing Goal: Acute Chest Syndrome will be identified early to prevent complications Outcome: Progressing   Problem: Fluid Volume: Goal: Ability to maintain a balanced intake and output will improve Outcome: Progressing   Problem: Sensory: Goal: Pain level will decrease with appropriate interventions Outcome: Progressing   Problem: Health Behavior: Goal: Postive changes in compliance with treatment and prescription regimens will improve Outcome: Progressing   Problem: Health Behavior: Goal: Postive changes in compliance with treatment and prescription regimens will improve Outcome: Progressing   Problem: Education: Goal: Knowledge of General Education information will improve Description: Including pain rating scale, medication(s)/side effects and non-pharmacologic comfort measures Outcome: Progressing   Problem: Health Behavior/Discharge Planning: Goal: Ability to manage health-related needs will improve Outcome: Progressing   Problem: Clinical  Measurements: Goal: Ability to maintain clinical measurements within normal limits will improve Outcome: Progressing Goal: Will remain free from infection Outcome: Progressing Goal: Diagnostic test results will improve Outcome: Progressing Goal: Respiratory complications will improve Outcome: Progressing Goal: Cardiovascular complication will be avoided Outcome: Progressing   Problem: Activity: Goal: Risk for activity intolerance will decrease Outcome: Progressing   Problem: Nutrition: Goal: Adequate nutrition will be maintained Outcome: Progressing   Problem: Coping: Goal: Level of anxiety will decrease Outcome: Progressing   Problem: Elimination: Goal: Will not experience complications related to bowel motility Outcome: Progressing Goal: Will not experience complications related to urinary retention Outcome: Progressing   Problem: Pain Managment: Goal: General experience of comfort will improve Outcome: Progressing   Problem: Safety: Goal: Ability to remain free from injury will improve Outcome: Progressing   Problem: Skin Integrity: Goal: Risk for impaired skin integrity will decrease Outcome: Progressing

## 2022-04-08 NOTE — Progress Notes (Signed)
PT Cancellation Note  Patient Details Name: Patrick [name] MRN: [id number] DOB: 11/21/1955   Cancelled Treatment:    Reason Eval/Treat Not Completed: PT screened, no needs identified, will sign off [place] with pt and he reports he has already ambulated and does not feel he needs PT services at this time. If needs change, please re-consult.   Lou Miner, DPT  Acute Rehabilitation Services  Office: [phone]    Rudean Hitt 04/08/2022, 9:42 AM

## 2022-04-09 ENCOUNTER — Telehealth: Payer: Self-pay

## 2022-04-09 ENCOUNTER — Encounter: Payer: Self-pay | Admitting: *Deleted

## 2022-04-09 ENCOUNTER — Telehealth: Payer: Self-pay | Admitting: *Deleted

## 2022-04-09 NOTE — Telephone Encounter (Signed)
Left message on machine to call back  

## 2022-04-09 NOTE — Patient Outreach (Signed)
  Care Coordination [facility] Note Transition Care Management Unsuccessful Follow-up Telephone Call  Date of discharge and from where:  Sunday, 04/08/22 [place]; sickle cell pain/ crisis/ abdominal pain- post ERCP  Attempts:  1st Attempt  Reason for unsuccessful TCM follow-up call:  Left voice message  Oneta Rack, RN, BSN, CCRN Alumnus RN CM Care Coordination/ Transition of [place] Management [phone]: direct office

## 2022-04-09 NOTE — Telephone Encounter (Signed)
-----   Message from Loralie Champagne, PA-C sent at 04/07/2022  3:24 PM EDT ----- I know that this patient is scheduled for an ERCP with Dr. Rush Landmark on 11/30.  Can you please get him an office visit with him before that as well?   Thank you,   Jess

## 2022-04-09 NOTE — Telephone Encounter (Signed)
-----   Message from Loralie Champagne, PA-C sent at 04/07/2022  3:24 PM EDT ----- I know that this patient is scheduled for an ERCP with Dr. Rush Landmark on 11/30.  Can you please get him an office visit with him before that as well?  Thank you,  Jess

## 2022-04-10 ENCOUNTER — Encounter: Payer: Self-pay | Admitting: *Deleted

## 2022-04-10 ENCOUNTER — Telehealth: Payer: Self-pay | Admitting: *Deleted

## 2022-04-10 LAB — TOTAL BILIRUBIN, BODY FLUID: Total bilirubin, fluid: 1.5 mg/dL

## 2022-04-10 NOTE — Patient Outreach (Signed)
  Care Coordination [facility] Note Transition Care Management Unsuccessful Follow-up Telephone Call  Date of discharge and from where:  Sunday, 04/08/22[place]; Sickle cell pain/ crisis/ ERCP  Attempts:  2nd Attempt  Reason for unsuccessful TCM follow-up call:  Left voice message  Oneta Rack, RN, BSN, CCRN Alumnus RN CM Care Coordination/ Transition of [place] Management [phone]: direct office

## 2022-04-10 NOTE — Telephone Encounter (Signed)
I have been unable to reach the pt by phone- several messages have been left for the pt to return call.  No response. I did mail the ERCP pt instructions to the pt home address.  Fernand Parkins

## 2022-04-10 NOTE — TOC Transition Note (Signed)
Transition of care contact from inpatient facility  Date of discharge: 04/08/22 Date of contact: 04/10/22 Method: Attempted Phone Call Spoke to: No Answer  Called patient to discuss transition of care from recent inpatient hospitalization but patient didn't pick up the phone. A voicemail was left to contact his outpatient HD center for any questions/concerns.  Patient will follow up with his outpatient HD unit on: Thursday 04/12/22 at [facility].  Tobie Poet, NP

## 2022-04-11 ENCOUNTER — Encounter: Payer: Self-pay | Admitting: *Deleted

## 2022-04-11 ENCOUNTER — Encounter: Payer: Self-pay | Admitting: Gastroenterology

## 2022-04-11 ENCOUNTER — Telehealth: Payer: Self-pay | Admitting: *Deleted

## 2022-04-11 LAB — AEROBIC/ANAEROBIC CULTURE W GRAM STAIN (SURGICAL/DEEP WOUND)
Culture: NO GROWTH
Gram Stain: NONE SEEN

## 2022-04-11 LAB — CYTOLOGY - NON PAP

## 2022-04-11 NOTE — Progress Notes (Signed)
[facility]  Peripancreatic cyst fluid CEA 3.8 ng/mL Amylase 30 units/L Glucose 64 mg/dL  Patient is scheduled for ERCP with stent pull in November with me due to concern for previous biliary leak but no overt extravasation noted on last ERCP.  Patient needs updated imaging in the next few weeks and I am happy to review it (Dr. Lyndel Safe his primary GI for this patient).  We will need that to get a better assessment of how things look after the aspiration.  CT abdomen/pelvis with IV and oral contrast should be completed by second week of November.  Follow-up in clinic with one of the APP's or Dr. Lyndel Safe or myself after the CT scan has been completed.  No other changes in plan of action currently.   Justice Britain, MD [facility] Office # [phone]

## 2022-04-11 NOTE — Patient Outreach (Signed)
  Care Coordination [facility] Note Transition Care Management Unsuccessful Follow-up Telephone Call  Date of discharge and from where:  Sunday, 04/08/22 [place]; sickle cell crisis/ pain; ERCP  Attempts:  3rd Attempt  Reason for unsuccessful TCM follow-up call:  Left voice message  Oneta Rack, RN, BSN, CCRN Alumnus RN CM Care Coordination/ Transition of [place] Management [phone]: direct office

## 2022-04-12 ENCOUNTER — Other Ambulatory Visit: Payer: Self-pay

## 2022-04-12 DIAGNOSIS — K839 Disease of biliary tract, unspecified: Secondary | ICD-10-CM

## 2022-04-12 NOTE — Progress Notes (Signed)
The patient is homeless. Letter probably will get to him. Hopefully you can put him on for a repeat telephone call in the next few weeks so that he is aware. Thanks. GM

## 2022-04-12 NOTE — Progress Notes (Signed)
Left message on machine to call back  

## 2022-04-12 NOTE — Progress Notes (Signed)
Noted will try to reach the pt in a few weeks

## 2022-04-12 NOTE — Progress Notes (Signed)
CT scan has been ordered and sent to the schedulers  ERCP scheduled for 05/17/22  Appt with Alonza Bogus PA on 05/01/22 at 130 pm   I have been unable to reach pt by phone  Letter has been mailed.    FYI GM, JZ

## 2022-04-14 ENCOUNTER — Other Ambulatory Visit: Payer: Self-pay | Admitting: Nurse Practitioner

## 2022-04-15 LAB — LIPASE, FLUID: Lipase-Fluid: 37 U/L

## 2022-04-17 ENCOUNTER — Ambulatory Visit (INDEPENDENT_AMBULATORY_CARE_PROVIDER_SITE_OTHER): Payer: HMO | Admitting: Nurse Practitioner

## 2022-04-17 ENCOUNTER — Encounter: Payer: Self-pay | Admitting: Nurse Practitioner

## 2022-04-17 VITALS — BP 105/69 | HR 80 | Temp 98.1°F | Ht 71.0 in | Wt 140.0 lb

## 2022-04-17 DIAGNOSIS — N186 End stage renal disease: Secondary | ICD-10-CM

## 2022-04-17 DIAGNOSIS — Z09 Encounter for follow-up examination after completed treatment for conditions other than malignant neoplasm: Secondary | ICD-10-CM | POA: Diagnosis not present

## 2022-04-17 DIAGNOSIS — Z992 Dependence on renal dialysis: Secondary | ICD-10-CM

## 2022-04-17 DIAGNOSIS — R059 Cough, unspecified: Secondary | ICD-10-CM | POA: Diagnosis not present

## 2022-04-17 DIAGNOSIS — R1010 Upper abdominal pain, unspecified: Secondary | ICD-10-CM | POA: Diagnosis not present

## 2022-04-17 DIAGNOSIS — I4819 Other persistent atrial fibrillation: Secondary | ICD-10-CM

## 2022-04-17 DIAGNOSIS — D649 Anemia, unspecified: Secondary | ICD-10-CM

## 2022-04-17 DIAGNOSIS — D571 Sickle-cell disease without crisis: Secondary | ICD-10-CM

## 2022-04-17 DIAGNOSIS — I5042 Chronic combined systolic (congestive) and diastolic (congestive) heart failure: Secondary | ICD-10-CM

## 2022-04-17 MED ORDER — BENZONATATE 100 MG PO CAPS: 100.0000 mg | ORAL_CAPSULE | Freq: Two times a day (BID) | ORAL | 0 refills | Status: DC | PRN

## 2022-04-17 MED ORDER — OXYCODONE HCL 10 MG PO TABS: 10.0000 mg | ORAL_TABLET | Freq: Four times a day (QID) | ORAL | 0 refills | Status: DC | PRN

## 2022-04-17 NOTE — Progress Notes (Signed)
Established Patient Office Visit  Subjective:  Patient ID: Patrick [name], male    DOB: 01-17-1956  Age: 66 y.o. MRN: [id number]  CC:  Chief Complaint  Patient presents with   Hospitalization Follow-up    HPI Patrick [name] is a 66 y.o. male with past medical history of sickle cell/thalassemia, chronic pain syndrome, former smoker, end-stage renal disease on hemodialysis, permanent A-fib not on anticoagulation, small bowel resection for ischemic colitis, presents for follow-up for hospital admission from March 31, 2022 to October 2022 2023 for abdominal pain, upper abdominal cyst pancreatic pseudocyst, elevated LFTs.  Patient underwent EGD, status post ERCP and biliary stent placed on April 04, 2022, on April 06, 2022 patient had aspiration of upper intra-abdominal cyst done by IR.  Patient states that he still has intermittent abdominal pain, loss of appetite ,rated his abdominal pain as 5/10 laying down on pillows relieves  his pain.  He denies nausea, vomiting, constipation, fever, chills.  He has upcoming appointment with GI on April 20, 2022.  Patient encouraged to keep this appointment.   In the hospital patient was given 3 units of red blood cell transfusion, will check CBC today.  Goes to dialysis on Tuesday Thursday Saturdays still makes some urine ,has right arm AV fistula, patient denies shortness of breath, chest pain, edema.    Patient complains of chronic nonproductive cough that started prior to his hospitalization.  He has been taking dextromethorphan guaifenesin as needed but this has not been helpful.  He did denies fever, chills, shortness of breath wheezing.    .   Past Medical History:  Diagnosis Date   A-fib ([place])    Blood dyscrasia    Blood transfusion    "I've had a bunch of them"   CHF (congestive heart failure) ([place])    followed by hf clinic   Dialysis patient [facility]) 04/2019   AV fistula (right arm), TTS   Dysrhythmia    A-Fib per patient    Gout    Headache(784.0)    "probably weekly" (01/13/2014)   Legally blind    Mitral regurgitation    Pulmonary hypertension (HCC)    Shortness of breath dyspnea    Sickle cell disease, type SS (HCC)    Swelling abdomen    Swelling of extremity, left    Swelling of extremity, right    Tricuspid regurgitation     Past Surgical History:  Procedure Laterality Date   A/V FISTULAGRAM Right 07/29/2020   Procedure: A/V FISTULAGRAM - Right Arm;  Surgeon: Angelia Mould, MD;  Location: [facility] CV LAB;  Service: Cardiovascular;  Laterality: Right;   BASCILIC VEIN TRANSPOSITION Right 04/12/2014   Procedure: BASILIC VEIN TRANSPOSITION;  Surgeon: Elam Dutch, MD;  Location: [place];  Service: Vascular;  Laterality: Right;   BILIARY STENT PLACEMENT  04/04/2022   Procedure: BILIARY STENT PLACEMENT;  Surgeon: Ladene Artist, MD;  Location: [place];  Service: Gastroenterology;;   BIOPSY  08/04/2020   Procedure: BIOPSY;  Surgeon: Ladene Artist, MD;  Location: [place];  Service: Gastroenterology;;   BIOPSY  06/28/2021   Procedure: BIOPSY;  Surgeon: Jerene Bears, MD;  Location: [place];  Service: Gastroenterology;;   BIOPSY  04/02/2022   Procedure: BIOPSY;  Surgeon: Irving Copas., MD;  Location: [place];  Service: Gastroenterology;;   BOWEL RESECTION N/A 05/24/2021   Procedure: SMALL BOWEL RESECTION;  Surgeon: Rolm Bookbinder, MD;  Location: [place];  Service: General;  Laterality: N/A;  CARDIOVERSION N/A 06/05/2018   Procedure: CARDIOVERSION;  Surgeon: Jolaine Artist, MD;  Location: [facility] ENDOSCOPY;  Service: Cardiovascular;  Laterality: N/A;   CARDIOVERSION N/A 10/06/2019   Procedure: CARDIOVERSION;  Surgeon: Jolaine Artist, MD;  Location: [facility] ENDOSCOPY;  Service: Cardiovascular;  Laterality: N/A;   CHOLECYSTECTOMY N/A 05/24/2021   Procedure: OPEN CHOLECYSTECTOMY;  Surgeon: Rolm Bookbinder, MD;  Location: [place];  Service: General;   Laterality: N/A;   COLONOSCOPY WITH PROPOFOL N/A 09/24/2017   Procedure: COLONOSCOPY WITH PROPOFOL;  Surgeon: Jackquline Denmark, MD;  Location: [facility] ENDOSCOPY;  Service: Endoscopy;  Laterality: N/A;   ERCP N/A 04/04/2022   Procedure: ENDOSCOPIC RETROGRADE CHOLANGIOPANCREATOGRAPHY (ERCP);  Surgeon: Ladene Artist, MD;  Location: [place];  Service: Gastroenterology;  Laterality: N/A;   ESOPHAGOGASTRODUODENOSCOPY N/A 09/19/2017   Procedure: ESOPHAGOGASTRODUODENOSCOPY (EGD);  Surgeon: Jackquline Denmark, MD;  Location: [facility] ENDOSCOPY;  Service: Endoscopy;  Laterality: N/A;   ESOPHAGOGASTRODUODENOSCOPY N/A 05/29/2021   Procedure: ESOPHAGOGASTRODUODENOSCOPY (EGD);  Surgeon: Milus Banister, MD;  Location: [facility] ENDOSCOPY;  Service: Gastroenterology;  Laterality: N/A;   ESOPHAGOGASTRODUODENOSCOPY (EGD) WITH PROPOFOL N/A 08/04/2020   Procedure: ESOPHAGOGASTRODUODENOSCOPY (EGD) WITH PROPOFOL;  Surgeon: Ladene Artist, MD;  Location: [place];  Service: Gastroenterology;  Laterality: N/A;   ESOPHAGOGASTRODUODENOSCOPY (EGD) WITH PROPOFOL N/A 06/28/2021   Procedure: ESOPHAGOGASTRODUODENOSCOPY (EGD) WITH PROPOFOL;  Surgeon: Jerene Bears, MD;  Location: [facility] ENDOSCOPY;  Service: Gastroenterology;  Laterality: N/A;   ESOPHAGOGASTRODUODENOSCOPY (EGD) WITH PROPOFOL N/A 08/10/2021   Procedure: ESOPHAGOGASTRODUODENOSCOPY (EGD) WITH PROPOFOL;  Surgeon: Jackquline Denmark, MD;  Location: [place];  Service: Gastroenterology;  Laterality: N/A;   ESOPHAGOGASTRODUODENOSCOPY (EGD) WITH PROPOFOL N/A 08/02/2021   Procedure: ESOPHAGOGASTRODUODENOSCOPY (EGD) WITH PROPOFOL;  Surgeon: Lavena Bullion, DO;  Location: [place];  Service: Gastroenterology;  Laterality: N/A;   ESOPHAGOGASTRODUODENOSCOPY (EGD) WITH PROPOFOL N/A 04/02/2022   Procedure: ESOPHAGOGASTRODUODENOSCOPY (EGD) WITH PROPOFOL;  Surgeon: Rush Landmark Telford Nab., MD;  Location: [place];  Service: Gastroenterology;  Laterality: N/A;   FINE NEEDLE  ASPIRATION  04/02/2022   Procedure: FINE NEEDLE ASPIRATION (FNA) LINEAR;  Surgeon: Irving Copas., MD;  Location: [facility] ENDOSCOPY;  Service: Gastroenterology;;   HEMOSTASIS CLIP PLACEMENT  05/29/2021   Procedure: HEMOSTASIS CLIP PLACEMENT;  Surgeon: Milus Banister, MD;  Location: [facility] ENDOSCOPY;  Service: Gastroenterology;;   HOT HEMOSTASIS N/A 05/29/2021   Procedure: HOT HEMOSTASIS (ARGON PLASMA COAGULATION/BICAP);  Surgeon: Milus Banister, MD;  Location: [facility] ENDOSCOPY;  Service: Gastroenterology;  Laterality: N/A;   HOT HEMOSTASIS N/A 06/28/2021   Procedure: HOT HEMOSTASIS (ARGON PLASMA COAGULATION/BICAP);  Surgeon: Jerene Bears, MD;  Location: [facility] ENDOSCOPY;  Service: Gastroenterology;  Laterality: N/A;   HOT HEMOSTASIS N/A 08/10/2021   Procedure: HOT HEMOSTASIS (ARGON PLASMA COAGULATION/BICAP);  Surgeon: Jackquline Denmark, MD;  Location: [facility] ENDOSCOPY;  Service: Gastroenterology;  Laterality: N/A;   HOT HEMOSTASIS N/A 08/02/2021   Procedure: HOT HEMOSTASIS (ARGON PLASMA COAGULATION/BICAP);  Surgeon: Lavena Bullion, DO;  Location: [facility] ENDOSCOPY;  Service: Gastroenterology;  Laterality: N/A;   IR PERC TUN PERIT CATH WO PORT S&I /IMAG  05/23/2021   IR REMOVAL TUN CV CATH W/O FL  06/10/2021   IR US GUIDE VASC ACCESS RIGHT  05/23/2021   LAPAROSCOPY N/A 05/24/2021   Procedure: LAPAROSCOPY DIAGNOSTIC;  Surgeon: Rolm Bookbinder, MD;  Location: [place];  Service: General;  Laterality: N/A;   LAPAROTOMY N/A 05/24/2021   Procedure: EXPLORATORY LAPAROTOMY;  Surgeon: Rolm Bookbinder, MD;  Location: [place];  Service: General;  Laterality: N/A;   LEFT AND [place]  WITH CORONARY ANGIOGRAM N/A 06/11/2011   Procedure: LEFT AND RIGHT HEART CATHETERIZATION WITH CORONARY ANGIOGRAM;  Surgeon: Larey Dresser, MD;  Location: [facility] CATH LAB;  Service: Cardiovascular;  Laterality: N/A;   PERIPHERAL VASCULAR BALLOON ANGIOPLASTY Right 07/29/2020   Procedure: PERIPHERAL VASCULAR BALLOON  ANGIOPLASTY;  Surgeon: Angelia Mould, MD;  Location: [place] CV LAB;  Service: Cardiovascular;  Laterality: Right;  arm fistula   REVISON OF ARTERIOVENOUS FISTULA Right 08/08/2020   Procedure: ANEURYSM EXCISION OF RIGHT UPPER EXTREMITY ARTERIOVENOUS FISTULA;  Surgeon: Angelia Mould, MD;  Location: [place];  Service: Vascular;  Laterality: Right;   REVISON OF ARTERIOVENOUS FISTULA Right 03/21/2022   Procedure: EXCISION OF ULCERATED SKIN OF RIGHT ARTERIOVENOUS FISTULA REVISION;  Surgeon: Marty Heck, MD;  Location: [facility] OR;  Service: Vascular;  Laterality: Right;   SCLEROTHERAPY  05/29/2021   Procedure: Clide Deutscher;  Surgeon: Milus Banister, MD;  Location: [facility] ENDOSCOPY;  Service: Gastroenterology;;   Joan Mayans  04/04/2022   Procedure: SPHINCTEROTOMY;  Surgeon: Ladene Artist, MD;  Location: [facility] ENDOSCOPY;  Service: Gastroenterology;;   Lavell Islam REMOVAL  05/29/2021   Procedure: STENT REMOVAL;  Surgeon: Milus Banister, MD;  Location: [place];  Service: Gastroenterology;;   TEE WITHOUT CARDIOVERSION N/A 12/27/2015   Procedure: TRANSESOPHAGEAL ECHOCARDIOGRAM (TEE);  Surgeon: Sueanne Margarita, MD;  Location: [place];  Service: Cardiovascular;  Laterality: N/A;   UPPER ESOPHAGEAL ENDOSCOPIC ULTRASOUND (EUS) N/A 04/02/2022   Procedure: UPPER ESOPHAGEAL ENDOSCOPIC ULTRASOUND (EUS);  Surgeon: Irving Copas., MD;  Location: [place];  Service: Gastroenterology;  Laterality: N/A;    Family History  Problem Relation Age of Onset   Alcohol abuse Mother    Liver disease Mother    Cirrhosis Mother    Heart attack Mother     Social History   Socioeconomic History   Marital status: Divorced    Spouse name: Not on file   Number of children: Not on file   Years of education: Not on file   Highest education level: Not on file  Occupational History   Occupation: disabled  Tobacco Use   Smoking status: Former    Packs/day: 0.50    Years: 41.00     Total pack years: 20.50    Types: Cigarettes    Quit date: 12/08/2015    Years since quitting: 6.3   Smokeless tobacco: Never  Vaping Use   Vaping Use: Never used  Substance and Sexual Activity   Alcohol use: Not Currently    Alcohol/week: 3.0 standard drinks of alcohol    Types: 1 Glasses of wine, 2 Cans of beer per week    Comment: none since starting dialysis 06/2021   Drug use: No   Sexual activity: Not Currently  Other Topics Concern   Not on file  Social History Narrative   Not on file   Social Determinants of Health   Financial Resource Strain: Low Risk  (06/02/2021)   Overall Financial Resource Strain (CARDIA)    Difficulty of Paying Living Expenses: Not very hard  Food Insecurity: No Food Insecurity (04/01/2022)   Hunger Vital Sign    Worried About Running Out of Food in the Last Year: Never true    [place] in the Last Year: Never true  Transportation Needs: No Transportation Needs (04/01/2022)   PRAPARE - Hydrologist (Medical): No    Lack of Transportation (Non-Medical): No  Physical Activity: Sufficiently Active (10/01/2019)   Exercise Vital Sign  Days of Exercise per Week: 5 days    Minutes of Exercise per Session: 90 min  Stress: Not on file  Social Connections: Not on file  Intimate Partner Violence: Not At Risk (04/01/2022)   Humiliation, Afraid, Rape, and Kick questionnaire    Fear of Current or Ex-Partner: No    Emotionally Abused: No    Physically Abused: No    Sexually Abused: No    Outpatient Medications Prior to Visit  Medication Sig Dispense Refill   acetaminophen (TYLENOL) 500 MG tablet Take 2,000 mg by mouth in the morning, at noon, and at bedtime.     allopurinol (ZYLOPRIM) 100 MG tablet TAKE 1 TABLET BY MOUTH EVERY DAY (Patient taking differently: Take 100 mg by mouth daily.) 30 tablet 1   AURYXIA 1 GM 210 MG(Fe) tablet Take 210 mg by mouth in the morning and at bedtime.     folic acid (FOLVITE) 1 MG  tablet Take 1 tablet (1 mg total) by mouth daily. 30 tablet 0   hydroxyurea (HYDREA) 500 MG capsule Take 1 capsule (500 mg total) by mouth daily. 90 capsule 1   lidocaine-prilocaine (EMLA) cream Apply 1 application. topically Every Tuesday,Thursday,and Saturday with dialysis.     pantoprazole (PROTONIX) 40 MG tablet TAKE 1 TABLET BY MOUTH TWICE A DAY 180 tablet 1   tiZANidine (ZANAFLEX) 4 MG tablet TAKE 1/2 TABLET (2MG TOTAL) BY MOUTH EVERY 8 HOURS AS NEEDED FOR MUSCLE SPASM (Patient taking differently: Take 2 mg by mouth 3 (three) times daily as needed for muscle spasms.) 30 tablet 2   torsemide (DEMADEX) 20 MG tablet Take 20 mg by mouth in the morning, at noon, in the evening, and at bedtime.     Vitamin D, Ergocalciferol, (DRISDOL) 1.25 MG (50000 UNIT) CAPS capsule Take 1 capsule (50,000 Units total) by mouth once a week. Monday 5 capsule 0   Oxycodone HCl 10 MG TABS Take 1 tablet (10 mg total) by mouth every 6 (six) hours as needed. (Patient taking differently: Take 10 mg by mouth 2 (two) times daily as needed (pain).) 60 tablet 0   Dextromethorphan-guaiFENesin (MUCINEX COUGH & CHEST CONGEST PO) Take 1 Capful by mouth 3 (three) times daily as needed (cough). (Patient not taking: Reported on 04/17/2022)     diphenhydramine-acetaminophen (TYLENOL PM) 25-500 MG TABS tablet Take 2 tablets by mouth at bedtime as needed (sleep/pain). (Patient not taking: Reported on 04/17/2022)     No facility-administered medications prior to visit.    No Known Allergies  ROS Review of Systems  Constitutional:  Positive for appetite change. Negative for chills, diaphoresis, fatigue and fever.  HENT:  Positive for congestion.   Respiratory:  Positive for cough. Negative for apnea, choking, chest tightness, shortness of breath, wheezing and stridor.   Cardiovascular: Negative.  Negative for chest pain, palpitations and leg swelling.  Gastrointestinal:  Positive for abdominal pain. Negative for anal bleeding,  constipation, diarrhea, nausea, rectal pain and vomiting.  Genitourinary:  Negative for difficulty urinating, dysuria and hematuria.  Musculoskeletal:  Positive for back pain. Negative for gait problem, joint swelling, myalgias and neck pain.  Neurological: Negative.  Negative for dizziness, facial asymmetry, light-headedness and headaches.  Psychiatric/Behavioral:  Negative for agitation, behavioral problems, confusion and decreased concentration.       Objective:    Physical Exam Constitutional:      General: He is not in acute distress.    Appearance: He is not ill-appearing, toxic-appearing or diaphoretic.  Eyes:     General:  Right eye: No discharge.        Left eye: No discharge.     Extraocular Movements: Extraocular movements intact.     Pupils: Pupils are equal, round, and reactive to light.  Cardiovascular:     Rate and Rhythm: Normal rate and regular rhythm.     Pulses: Normal pulses.     Heart sounds: Normal heart sounds. No murmur heard.    No friction rub. No gallop.  Pulmonary:     Effort: No respiratory distress.     Breath sounds: No stridor. Rhonchi present. No wheezing or rales.  Chest:     Chest wall: No tenderness.  Abdominal:     Palpations: Abdomen is soft.     Tenderness: There is abdominal tenderness. There is guarding. There is no right CVA tenderness or left CVA tenderness.  Musculoskeletal:        General: No swelling, tenderness, deformity or signs of injury.     Right lower leg: No edema.     Left lower leg: No edema.  Skin:    General: Skin is warm and dry.     Capillary Refill: Capillary refill takes less than 2 seconds.  Neurological:     Mental Status: He is alert and oriented to person, place, and time.     Cranial Nerves: No cranial nerve deficit.     Motor: No weakness.     Gait: Gait normal.  Psychiatric:        Mood and Affect: Mood normal.        Behavior: Behavior normal.        Thought Content: Thought content normal.         Judgment: Judgment normal.     BP 105/69   Pulse 80   Temp 98.1 F (36.7 C) (Oral)   Ht _0  (1.803 m)   Wt 140 lb (63.5 kg)   SpO2 99%   BMI 19.53 kg/m  Wt Readings from Last 3 Encounters:  04/17/22 140 lb (63.5 kg)  04/08/22 134 lb 14.7 oz (61.2 kg)  03/21/22 151 lb (68.5 kg)    Lab Results  Component Value Date   TSH 2.301 02/17/2015   Lab Results  Component Value Date   WBC 5.7 04/07/2022   HGB 7.6 (L) 04/07/2022   HCT 21.3 (L) 04/07/2022   MCV 95.9 04/07/2022   PLT 164 04/07/2022   Lab Results  Component Value Date   NA 134 (L) 04/07/2022   K 3.6 04/07/2022   CO2 25 04/07/2022   GLUCOSE 121 (H) 04/07/2022   BUN 44 (H) 04/07/2022   CREATININE 7.06 (H) 04/07/2022   BILITOT 1.9 (H) 04/05/2022   ALKPHOS 334 (H) 04/05/2022   AST 25 04/05/2022   ALT 14 04/05/2022   PROT 6.5 04/05/2022   ALBUMIN 2.4 (L) 04/07/2022   CALCIUM 8.0 (L) 04/07/2022   ANIONGAP 12 04/07/2022   EGFR 16 (L) 09/04/2021   GFR 46.63 (L) 01/08/2012   Lab Results  Component Value Date   CHOL 114 (L) 02/17/2015   Lab Results  Component Value Date   HDL 31 (L) 02/17/2015   Lab Results  Component Value Date   LDLCALC 68 02/17/2015   Lab Results  Component Value Date   TRIG 42 06/05/2021   Lab Results  Component Value Date   CHOLHDL 3.7 02/17/2015   Lab Results  Component Value Date   HGBA1C 4.9 05/25/2021      Assessment & Plan:   Problem List  Items Addressed This Visit       Cardiovascular and Mediastinum   Chronic combined systolic and diastolic CHF (congestive heart failure) (HCC)    Denies shortness of breath, edema No Lower edema noted on examination today. Currently on torsemide 20 mg 4 times daily Patient encouraged to maintain close follow-up with cardiology he verbalized understanding BP Readings from Last 3 Encounters:  04/17/22 105/69  04/08/22 100/74  03/21/22 103/74        Persistent atrial fibrillation ([place])    Not on beta-blocker Not on  blood thinner due to history of hemorrhagic gastritis Patient encouraged to maintain close follow-up with cardiology he verbalized understanding.        Genitourinary   ESRD on HD TTS    Currently on hemodialysis Tuesday Thursday Saturdays Patient encouraged to keep up with his hemodialysis appointment he verbalized understanding He denies shortness of breath, edema still makes some urine. DASH diet advised        Other   Hb-SS disease without crisis ([place])    3 units of RBC given in the hospital Check CBC Oxycodone 10 mg every 6 hours as needed refilled Continue hydroxyurea 818 mg daily folic acid 1 mg daily Zanaflex 2 mg 3 times daily as needed Currently rating his pain at 3/10 pain is mainly in his lower back and legs. Follow-up in 1 month      Relevant Medications   Oxycodone HCl 10 MG TABS   Cough    Chronic nonproductive cough  taking Mucinex but no improvement Patient denies fever, chills Tessalon 100 mg twice daily as needed ordered Review of recent chest x-ray showed no pneumonia      Relevant Medications   benzonatate (TESSALON) 100 MG capsule   Acute on chronic anemia    Secondary to sickle cell anemia 3 units of RBC was given while in the hospital Check CBC levels today Lab Results  Component Value Date   WBC 5.7 04/07/2022   HGB 7.6 (L) 04/07/2022   HCT 21.3 (L) 04/07/2022   MCV 95.9 04/07/2022   PLT 164 04/07/2022        Hospital discharge follow-up    ollow-up for hospital admission from March 31, 2022 to October 2022 2023 for abdominal pain, upper abdominal cyst pancreatic pseudocyst, elevated LFTs.  Patient underwent EGD, status post ERCP and biliary stent placed on April 04, 2022, on April 06, 2022 patient had aspiration of upper intra-abdominal cyst done by IR.  Patient states that he still has intermittent abdominal pain, loss of appetite ,rated his abdominal pain as 5/10 laying down on pillows helped his pain.  He denies nausea, vomiting,  constipation, fever, chills.  He has upcoming appointment with GI on April 20, 2022      Relevant Orders   CBC   Basic Metabolic Panel   Upper abdominal pain - Primary    Upper abdominal cyst, pancreatic pseudocyst. status post ERCP and biliary stent placed on April 04, 2022, on April 06, 2022 patient had aspiration of upper intra-abdominal cyst done by IR.  Patient states that he still has intermittent abdominal pain, loss of appetite ,rated his abdominal pain as 5/10 laying down on pillows helped his pain.  He denies nausea, vomiting, constipation, fever, chills.  He has upcoming appointment with GI on April 20, 2022.  Patient encouraged to maintain this appointment.       Other Visit Diagnoses     End stage renal disease ([place])  Relevant Orders   CBC   Basic Metabolic Panel       Meds ordered this encounter  Medications   benzonatate (TESSALON) 100 MG capsule    Sig: Take 1 capsule (100 mg total) by mouth 2 (two) times daily as needed for cough.    Dispense:  20 capsule    Refill:  0   Oxycodone HCl 10 MG TABS    Sig: Take 1 tablet (10 mg total) by mouth every 6 (six) hours as needed.    Dispense:  60 tablet    Refill:  0    Follow-up: Return in about 1 month (around 05/17/2022) for sickle cell anemia.    Renee Rival, FNP

## 2022-04-17 NOTE — Assessment & Plan Note (Signed)
Not on beta-blocker Not on blood thinner due to history of hemorrhagic gastritis Patient encouraged to maintain close follow-up with cardiology he verbalized understanding.

## 2022-04-17 NOTE — Assessment & Plan Note (Addendum)
Currently on hemodialysis Tuesday Thursday Saturdays Patient encouraged to keep up with his hemodialysis appointment he verbalized understanding He denies shortness of breath, edema still makes some urine. DASH diet advised

## 2022-04-17 NOTE — Assessment & Plan Note (Signed)
Upper abdominal cyst, pancreatic pseudocyst. status post ERCP and biliary stent placed on April 04, 2022, on April 06, 2022 patient had aspiration of upper intra-abdominal cyst done by IR.  Patient states that he still has intermittent abdominal pain, loss of appetite ,rated his abdominal pain as 5/10 laying down on pillows helped his pain.  He denies nausea, vomiting, constipation, fever, chills.  He has upcoming appointment with GI on April 20, 2022.  Patient encouraged to maintain this appointment.

## 2022-04-17 NOTE — Assessment & Plan Note (Signed)
Secondary to sickle cell anemia 3 units of RBC was given while in the hospital Check CBC levels today Lab Results  Component Value Date   WBC 5.7 04/07/2022   HGB 7.6 (L) 04/07/2022   HCT 21.3 (L) 04/07/2022   MCV 95.9 04/07/2022   PLT 164 04/07/2022

## 2022-04-17 NOTE — Assessment & Plan Note (Signed)
>>  ASSESSMENT AND PLAN FOR ACUTE ON CHRONIC ANEMIA WRITTEN ON 04/17/2022  2:51 PM BY PASEDA, Baird Kay, FNP  Secondary to sickle cell anemia 3 units of RBC was given while in the hospital Check CBC levels today Lab Results  Component Value Date   WBC 5.7 04/07/2022   HGB 7.6 (L) 04/07/2022   HCT 21.3 (L) 04/07/2022   MCV 95.9 04/07/2022   PLT 164 04/07/2022

## 2022-04-17 NOTE — Assessment & Plan Note (Signed)
3 units of RBC given in the hospital Check CBC Oxycodone 10 mg every 6 hours as needed refilled Continue hydroxyurea 096 mg daily folic acid 1 mg daily Zanaflex 2 mg 3 times daily as needed Currently rating his pain at 3/10 pain is mainly in his lower back and legs. Follow-up in 1 month

## 2022-04-17 NOTE — Assessment & Plan Note (Signed)
Denies shortness of breath, edema No Lower edema noted on examination today. Currently on torsemide 20 mg 4 times daily Patient encouraged to maintain close follow-up with cardiology he verbalized understanding BP Readings from Last 3 Encounters:  04/17/22 105/69  04/08/22 100/74  03/21/22 103/74

## 2022-04-17 NOTE — Progress Notes (Signed)
No appetite. Having pain in abdomen and back.

## 2022-04-17 NOTE — Assessment & Plan Note (Addendum)
Chronic nonproductive cough  taking Mucinex but no improvement Patient denies fever, chills Tessalon 100 mg twice daily as needed ordered Review of recent chest x-ray showed no pneumonia

## 2022-04-17 NOTE — Assessment & Plan Note (Signed)
ollow-up for hospital admission from March 31, 2022 to October 2022 2023 for abdominal pain, upper abdominal cyst pancreatic pseudocyst, elevated LFTs.  Patient underwent EGD, status post ERCP and biliary stent placed on April 04, 2022, on April 06, 2022 patient had aspiration of upper intra-abdominal cyst done by IR.  Patient states that he still has intermittent abdominal pain, loss of appetite ,rated his abdominal pain as 5/10 laying down on pillows helped his pain.  He denies nausea, vomiting, constipation, fever, chills.  He has upcoming appointment with GI on April 20, 2022

## 2022-04-17 NOTE — Patient Instructions (Addendum)
Please schedule your medicare annual wellness exam today  Take tessalon 100mg  twice daily as needed for cough.    t is important that you exercise regularly at least 30 minutes 5 times a week as tolerated  Think about what you will eat, plan ahead. Choose " clean, green, fresh or frozen" over canned, processed or packaged foods which are more sugary, salty and fatty. 70 to 75% of food eaten should be vegetables and fruit. Three meals at set times with snacks allowed between meals, but they must be fruit or vegetables. Aim to eat over a 12 hour period , example 7 am to 7 pm, and STOP after  your last meal of the day. Drink water,generally about 64 ounces per day, no other drink is as healthy. Fruit juice is best enjoyed in a healthy way, by EATING the fruit.  Thanks for choosing [name] we consider it a privelige to serve you.

## 2022-04-18 LAB — BASIC METABOLIC PANEL
BUN/Creatinine Ratio: 6 — ABNORMAL LOW (ref 10–24)
BUN: 41 mg/dL — ABNORMAL HIGH (ref 8–27)
CO2: 24 mmol/L (ref 20–29)
Calcium: 8.3 mg/dL — ABNORMAL LOW (ref 8.6–10.2)
Chloride: 88 mmol/L — ABNORMAL LOW (ref 96–106)
Creatinine, Ser: 6.53 mg/dL — ABNORMAL HIGH (ref 0.76–1.27)
Glucose: 74 mg/dL (ref 70–99)
Potassium: 4.7 mmol/L (ref 3.5–5.2)
Sodium: 133 mmol/L — ABNORMAL LOW (ref 134–144)
eGFR: 9 mL/min/{1.73_m2} — ABNORMAL LOW (ref >59)

## 2022-04-18 LAB — CBC
Hematocrit: 22.6 % — ABNORMAL LOW (ref 37.5–51.0)
Hemoglobin: 7.8 g/dL — ABNORMAL LOW (ref 13.0–17.7)
MCH: 34.8 pg — ABNORMAL HIGH (ref 26.6–33.0)
MCHC: 34.5 g/dL (ref 31.5–35.7)
MCV: 101 fL — ABNORMAL HIGH (ref 79–97)
NRBC: 1 % — ABNORMAL HIGH (ref 0–0)
Platelets: 208 10*3/uL (ref 150–450)
RBC: 2.24 x10E6/uL — CL (ref 4.14–5.80)
RDW: 27.2 % — ABNORMAL HIGH (ref 11.6–15.4)
WBC: 5.1 10*3/uL (ref 3.4–10.8)

## 2022-04-18 NOTE — Progress Notes (Signed)
Stable labs , continue current medications

## 2022-04-20 ENCOUNTER — Encounter (HOSPITAL_COMMUNITY): Payer: Self-pay

## 2022-04-20 ENCOUNTER — Ambulatory Visit (HOSPITAL_COMMUNITY)
Admission: RE | Admit: 2022-04-20 | Discharge: 2022-04-20 | Disposition: A | Discharge status: Home / Self Care | Payer: HMO | Source: Ambulatory Visit | Attending: Gastroenterology | Admitting: Gastroenterology

## 2022-04-20 DIAGNOSIS — K839 Disease of biliary tract, unspecified: Secondary | ICD-10-CM | POA: Insufficient documentation

## 2022-04-20 MED ORDER — SODIUM CHLORIDE (PF) 0.9 % IJ SOLN
INTRAMUSCULAR | Status: AC
  Filled 2022-04-20: qty 50

## 2022-04-20 MED ORDER — IOHEXOL 300 MG/ML  SOLN
100.0000 mL | Freq: Once | INTRAMUSCULAR | Status: AC | PRN
  Administered 2022-04-20: 80 mL via INTRAVENOUS

## 2022-04-26 ENCOUNTER — Other Ambulatory Visit: Payer: Self-pay

## 2022-04-26 DIAGNOSIS — K839 Disease of biliary tract, unspecified: Secondary | ICD-10-CM

## 2022-05-01 ENCOUNTER — Ambulatory Visit: Payer: HMO | Admitting: Gastroenterology

## 2022-05-02 ENCOUNTER — Other Ambulatory Visit: Payer: Self-pay | Admitting: Family Medicine

## 2022-05-02 ENCOUNTER — Telehealth: Payer: Self-pay | Admitting: Internal Medicine

## 2022-05-02 DIAGNOSIS — M545 Low back pain, unspecified: Secondary | ICD-10-CM

## 2022-05-02 DIAGNOSIS — D571 Sickle-cell disease without crisis: Secondary | ICD-10-CM

## 2022-05-02 MED ORDER — OXYCODONE HCL 10 MG PO TABS: 10.0000 mg | ORAL_TABLET | Freq: Four times a day (QID) | ORAL | 0 refills | Status: DC | PRN

## 2022-05-02 MED ORDER — TIZANIDINE HCL 4 MG PO TABS: ORAL_TABLET | ORAL | 2 refills | Status: DC

## 2022-05-02 NOTE — Telephone Encounter (Signed)
Oxycodone Back spasm med

## 2022-05-02 NOTE — Progress Notes (Signed)
Reviewed PDMP substance reporting system prior to prescribing opiate medications. No inconsistencies noted.  Meds ordered this encounter  Medications   Oxycodone HCl 10 MG TABS    Sig: Take 1 tablet (10 mg total) by mouth every 6 (six) hours as needed.    Dispense:  60 tablet    Refill:  0    Order Specific Question:   Supervising Provider    Answer:   [name] [[id number]]   tiZANidine (ZANAFLEX) 4 MG tablet    Sig: TAKE 1/2 TABLET (2MG  TOTAL) BY MOUTH EVERY 8 HOURS AS NEEDED FOR MUSCLE SPASM    Dispense:  30 tablet    Refill:  2    Order Specific Question:   Supervising Provider    Answer:   [name] [[id number]]   [name]  APRN, MSN, FNP-C [facility] [address] [place], [place] [address] [phone]

## 2022-05-08 ENCOUNTER — Encounter (HOSPITAL_COMMUNITY): Payer: Self-pay | Admitting: Gastroenterology

## 2022-05-08 NOTE — Progress Notes (Signed)
Attempted to obtain medical history via telephone, unable to reach at this time. HIPAA compliant voicemail message left requesting return call to pre surgical testing department. 

## 2022-05-15 ENCOUNTER — Ambulatory Visit: Payer: Self-pay | Admitting: Nurse Practitioner

## 2022-05-17 ENCOUNTER — Ambulatory Visit (HOSPITAL_COMMUNITY)
Admission: RE | Admit: 2022-05-17 | Discharge: 2022-05-17 | Disposition: A | Discharge status: Home / Self Care | Payer: HMO | Attending: Gastroenterology | Admitting: Gastroenterology

## 2022-05-17 ENCOUNTER — Ambulatory Visit (HOSPITAL_BASED_OUTPATIENT_CLINIC_OR_DEPARTMENT_OTHER): Payer: HMO | Admitting: Certified Registered Nurse Anesthetist

## 2022-05-17 ENCOUNTER — Other Ambulatory Visit: Payer: Self-pay

## 2022-05-17 ENCOUNTER — Encounter (HOSPITAL_COMMUNITY): Payer: Self-pay | Admitting: Gastroenterology

## 2022-05-17 ENCOUNTER — Ambulatory Visit (HOSPITAL_COMMUNITY): Payer: HMO | Admitting: Certified Registered Nurse Anesthetist

## 2022-05-17 ENCOUNTER — Ambulatory Visit (HOSPITAL_COMMUNITY): Payer: HMO

## 2022-05-17 ENCOUNTER — Encounter (HOSPITAL_COMMUNITY)
Admission: RE | Disposition: A | Discharge status: Home / Self Care | Payer: Self-pay | Source: Home / Self Care | Attending: Gastroenterology

## 2022-05-17 DIAGNOSIS — D571 Sickle-cell disease without crisis: Secondary | ICD-10-CM | POA: Diagnosis not present

## 2022-05-17 DIAGNOSIS — Z9049 Acquired absence of other specified parts of digestive tract: Secondary | ICD-10-CM | POA: Insufficient documentation

## 2022-05-17 DIAGNOSIS — I083 Combined rheumatic disorders of mitral, aortic and tricuspid valves: Secondary | ICD-10-CM | POA: Diagnosis not present

## 2022-05-17 DIAGNOSIS — K862 Cyst of pancreas: Secondary | ICD-10-CM

## 2022-05-17 DIAGNOSIS — I4891 Unspecified atrial fibrillation: Secondary | ICD-10-CM | POA: Diagnosis not present

## 2022-05-17 DIAGNOSIS — J449 Chronic obstructive pulmonary disease, unspecified: Secondary | ICD-10-CM | POA: Diagnosis not present

## 2022-05-17 DIAGNOSIS — Z992 Dependence on renal dialysis: Secondary | ICD-10-CM | POA: Insufficient documentation

## 2022-05-17 DIAGNOSIS — K3189 Other diseases of stomach and duodenum: Secondary | ICD-10-CM | POA: Insufficient documentation

## 2022-05-17 DIAGNOSIS — I5042 Chronic combined systolic (congestive) and diastolic (congestive) heart failure: Secondary | ICD-10-CM | POA: Diagnosis not present

## 2022-05-17 DIAGNOSIS — I509 Heart failure, unspecified: Secondary | ICD-10-CM | POA: Insufficient documentation

## 2022-05-17 DIAGNOSIS — Z87891 Personal history of nicotine dependence: Secondary | ICD-10-CM

## 2022-05-17 DIAGNOSIS — D631 Anemia in chronic kidney disease: Secondary | ICD-10-CM

## 2022-05-17 DIAGNOSIS — I132 Hypertensive heart and chronic kidney disease with heart failure and with stage 5 chronic kidney disease, or end stage renal disease: Secondary | ICD-10-CM

## 2022-05-17 DIAGNOSIS — N186 End stage renal disease: Secondary | ICD-10-CM | POA: Diagnosis not present

## 2022-05-17 DIAGNOSIS — K839 Disease of biliary tract, unspecified: Secondary | ICD-10-CM

## 2022-05-17 DIAGNOSIS — Z9889 Other specified postprocedural states: Secondary | ICD-10-CM | POA: Insufficient documentation

## 2022-05-17 DIAGNOSIS — Z4659 Encounter for fitting and adjustment of other gastrointestinal appliance and device: Secondary | ICD-10-CM | POA: Diagnosis present

## 2022-05-17 DIAGNOSIS — K838 Other specified diseases of biliary tract: Secondary | ICD-10-CM | POA: Insufficient documentation

## 2022-05-17 DIAGNOSIS — I700 Atherosclerosis of aorta: Secondary | ICD-10-CM | POA: Insufficient documentation

## 2022-05-17 HISTORY — PX: REMOVAL OF STONES: SHX5545

## 2022-05-17 HISTORY — PX: ENDOSCOPIC RETROGRADE CHOLANGIOPANCREATOGRAPHY (ERCP) WITH PROPOFOL: SHX5810

## 2022-05-17 HISTORY — PX: EUS: SHX5427

## 2022-05-17 HISTORY — PX: FINE NEEDLE ASPIRATION: SHX5430

## 2022-05-17 HISTORY — PX: ESOPHAGOGASTRODUODENOSCOPY (EGD) WITH PROPOFOL: SHX5813

## 2022-05-17 HISTORY — PX: STENT REMOVAL: SHX6421

## 2022-05-17 LAB — BODY FLUID CELL COUNT WITH DIFFERENTIAL
Eos, Fluid: 0 %
Eos, Fluid: 0 %
Lymphs, Fluid: 44 %
Lymphs, Fluid: 11 %
Monocyte-Macrophage-Serous Fluid: 56 % (ref 50–90)
Monocyte-Macrophage-Serous Fluid: 79 % (ref 50–90)
Neutrophil Count, Fluid: 0 % (ref 0–25)
Neutrophil Count, Fluid: 10 % (ref 0–25)
Total Nucleated Cell Count, Fluid: UNDETERMINED cu mm (ref 0–1000)
Total Nucleated Cell Count, Fluid: 151 cu mm (ref 0–1000)

## 2022-05-17 LAB — CBC
HCT: 19.2 % — ABNORMAL LOW (ref 39.0–52.0)
Hemoglobin: 6.8 g/dL — CL (ref 13.0–17.0)
MCH: 37.4 pg — ABNORMAL HIGH (ref 26.0–34.0)
MCHC: 35.4 g/dL (ref 30.0–36.0)
MCV: 105.5 fL — ABNORMAL HIGH (ref 80.0–100.0)
Platelets: 144 10*3/uL — ABNORMAL LOW (ref 150–400)
RBC: 1.82 MIL/uL — ABNORMAL LOW (ref 4.22–5.81)
RDW: 26.8 % — ABNORMAL HIGH (ref 11.5–15.5)
WBC: 6.6 10*3/uL (ref 4.0–10.5)
nRBC: 2.1 % — ABNORMAL HIGH (ref 0.0–0.2)

## 2022-05-17 LAB — PROTIME-INR
INR: 1.3 — ABNORMAL HIGH (ref 0.8–1.2)
Prothrombin Time: 16.3 seconds — ABNORMAL HIGH (ref 11.4–15.2)

## 2022-05-17 LAB — COMPREHENSIVE METABOLIC PANEL
ALT: 14 U/L (ref 0–44)
AST: 25 U/L (ref 15–41)
Albumin: 3.6 g/dL (ref 3.5–5.0)
Alkaline Phosphatase: 196 U/L — ABNORMAL HIGH (ref 38–126)
Anion gap: 11 (ref 5–15)
BUN: 21 mg/dL (ref 8–23)
CO2: 32 mmol/L (ref 22–32)
Calcium: 7.9 mg/dL — ABNORMAL LOW (ref 8.9–10.3)
Chloride: 94 mmol/L — ABNORMAL LOW (ref 98–111)
Creatinine, Ser: 3.62 mg/dL — ABNORMAL HIGH (ref 0.61–1.24)
GFR, Estimated: 18 mL/min — ABNORMAL LOW (ref >60)
Glucose, Bld: 124 mg/dL — ABNORMAL HIGH (ref 70–99)
Potassium: 3.3 mmol/L — ABNORMAL LOW (ref 3.5–5.1)
Sodium: 137 mmol/L (ref 135–145)
Total Bilirubin: 2.1 mg/dL — ABNORMAL HIGH (ref 0.3–1.2)
Total Protein: 8.1 g/dL (ref 6.5–8.1)

## 2022-05-17 LAB — POCT I-STAT, CHEM 8
BUN: 18 mg/dL (ref 8–23)
BUN: 20 mg/dL (ref 8–23)
Calcium, Ion: 0.93 mmol/L — ABNORMAL LOW (ref 1.15–1.40)
Calcium, Ion: 0.97 mmol/L — ABNORMAL LOW (ref 1.15–1.40)
Chloride: 94 mmol/L — ABNORMAL LOW (ref 98–111)
Chloride: 93 mmol/L — ABNORMAL LOW (ref 98–111)
Creatinine, Ser: 3.3 mg/dL — ABNORMAL HIGH (ref 0.61–1.24)
Creatinine, Ser: 3.7 mg/dL — ABNORMAL HIGH (ref 0.61–1.24)
Glucose, Bld: 80 mg/dL (ref 70–99)
Glucose, Bld: 120 mg/dL — ABNORMAL HIGH (ref 70–99)
HCT: 24 % — ABNORMAL LOW (ref 39.0–52.0)
HCT: 28 % — ABNORMAL LOW (ref 39.0–52.0)
Hemoglobin: 8.2 g/dL — ABNORMAL LOW (ref 13.0–17.0)
Hemoglobin: 9.5 g/dL — ABNORMAL LOW (ref 13.0–17.0)
Potassium: 3.4 mmol/L — ABNORMAL LOW (ref 3.5–5.1)
Potassium: 3.4 mmol/L — ABNORMAL LOW (ref 3.5–5.1)
Sodium: 137 mmol/L (ref 135–145)
Sodium: 137 mmol/L (ref 135–145)
TCO2: 32 mmol/L (ref 22–32)
TCO2: 30 mmol/L (ref 22–32)

## 2022-05-17 SURGERY — ENDOSCOPIC RETROGRADE CHOLANGIOPANCREATOGRAPHY (ERCP) WITH PROPOFOL
Anesthesia: General

## 2022-05-17 MED ORDER — EPINEPHRINE 1 MG/10ML IJ SOSY
PREFILLED_SYRINGE | INTRAMUSCULAR | Status: AC
  Filled 2022-05-17: qty 10

## 2022-05-17 MED ORDER — CIPROFLOXACIN IN D5W 400 MG/200ML IV SOLN
INTRAVENOUS | Status: DC | PRN
  Administered 2022-05-17: 400 mg via INTRAVENOUS

## 2022-05-17 MED ORDER — FENTANYL CITRATE (PF) 100 MCG/2ML IJ SOLN
INTRAMUSCULAR | Status: DC | PRN
  Administered 2022-05-17: 25 ug via INTRAVENOUS
  Administered 2022-05-17: 50 ug via INTRAVENOUS

## 2022-05-17 MED ORDER — DEXAMETHASONE SODIUM PHOSPHATE 10 MG/ML IJ SOLN
INTRAMUSCULAR | Status: DC | PRN
  Administered 2022-05-17: 4 mg via INTRAVENOUS

## 2022-05-17 MED ORDER — GLUCAGON HCL RDNA (DIAGNOSTIC) 1 MG IJ SOLR
INTRAMUSCULAR | Status: AC
  Filled 2022-05-17: qty 1

## 2022-05-17 MED ORDER — DICLOFENAC SUPPOSITORY 100 MG
RECTAL | Status: AC
  Filled 2022-05-17: qty 1

## 2022-05-17 MED ORDER — CIPROFLOXACIN IN D5W 400 MG/200ML IV SOLN
INTRAVENOUS | Status: AC
  Filled 2022-05-17: qty 200

## 2022-05-17 MED ORDER — SODIUM CHLORIDE 0.9 % IV SOLN
INTRAVENOUS | Status: DC | PRN
  Administered 2022-05-17: 25 mL

## 2022-05-17 MED ORDER — PROPOFOL 10 MG/ML IV BOLUS
INTRAVENOUS | Status: AC
  Filled 2022-05-17: qty 20

## 2022-05-17 MED ORDER — PROPOFOL 10 MG/ML IV BOLUS
INTRAVENOUS | Status: DC | PRN
  Administered 2022-05-17: 50 mg via INTRAVENOUS
  Administered 2022-05-17: 100 mg via INTRAVENOUS

## 2022-05-17 MED ORDER — GLUCAGON HCL RDNA (DIAGNOSTIC) 1 MG IJ SOLR
INTRAMUSCULAR | Status: DC | PRN
  Administered 2022-05-17: .25 mg via INTRAVENOUS

## 2022-05-17 MED ORDER — ALBUMIN HUMAN 5 % IV SOLN
INTRAVENOUS | Status: AC
  Filled 2022-05-17: qty 250

## 2022-05-17 MED ORDER — SODIUM CHLORIDE 0.9 % IV SOLN: INTRAVENOUS | Status: DC

## 2022-05-17 MED ORDER — FENTANYL CITRATE (PF) 100 MCG/2ML IJ SOLN
INTRAMUSCULAR | Status: AC
  Filled 2022-05-17: qty 2

## 2022-05-17 MED ORDER — PANTOPRAZOLE SODIUM 40 MG PO TBEC: 40.0000 mg | DELAYED_RELEASE_TABLET | Freq: Every day | ORAL | 1 refills | Status: DC

## 2022-05-17 MED ORDER — CIPROFLOXACIN HCL 500 MG PO TABS: 500.0000 mg | ORAL_TABLET | Freq: Two times a day (BID) | ORAL | 0 refills | Status: AC

## 2022-05-17 MED ORDER — DICLOFENAC SUPPOSITORY 100 MG
RECTAL | Status: DC | PRN
  Administered 2022-05-17: 100 mg via RECTAL

## 2022-05-17 MED ORDER — PHENYLEPHRINE 80 MCG/ML (10ML) SYRINGE FOR IV PUSH (FOR BLOOD PRESSURE SUPPORT)
PREFILLED_SYRINGE | INTRAVENOUS | Status: DC | PRN
  Administered 2022-05-17: 160 ug via INTRAVENOUS

## 2022-05-17 MED ORDER — ALBUMIN HUMAN 5 % IV SOLN: INTRAVENOUS | Status: DC | PRN

## 2022-05-17 MED ORDER — ONDANSETRON HCL 4 MG/2ML IJ SOLN
INTRAMUSCULAR | Status: DC | PRN
  Administered 2022-05-17: 4 mg via INTRAVENOUS

## 2022-05-17 MED ORDER — LIDOCAINE 2% (20 MG/ML) 5 ML SYRINGE
INTRAMUSCULAR | Status: DC | PRN
  Administered 2022-05-17: 40 mg via INTRAVENOUS

## 2022-05-17 MED ORDER — SUGAMMADEX SODIUM 500 MG/5ML IV SOLN
INTRAVENOUS | Status: DC | PRN
  Administered 2022-05-17: 240 mg via INTRAVENOUS

## 2022-05-17 MED ORDER — ROCURONIUM BROMIDE 10 MG/ML (PF) SYRINGE
PREFILLED_SYRINGE | INTRAVENOUS | Status: DC | PRN
  Administered 2022-05-17: 20 mg via INTRAVENOUS
  Administered 2022-05-17: 60 mg via INTRAVENOUS

## 2022-05-17 MED ORDER — PHENYLEPHRINE HCL-NACL 20-0.9 MG/250ML-% IV SOLN
INTRAVENOUS | Status: DC | PRN
  Administered 2022-05-17: 25 ug/min via INTRAVENOUS

## 2022-05-17 NOTE — Progress Notes (Signed)
CRITICAL RESULT PROVIDER NOTIFICATION  Test performed and critical result:  hgb 6.8  Date and time result received:  1500  Provider name/title: Mansouraty   Date and time provider notified: 11  Date and time provider responded: [id number]  Provider response:In department  Informed MD specimen potentially diluted- redrawn

## 2022-05-17 NOTE — Anesthesia Procedure Notes (Signed)
Procedure Name: Intubation Date/Time: 05/17/2022 12:27 PM  Performed by: West Pugh, CRNAPre-anesthesia Checklist: Patient identified, Emergency Drugs available, Suction available, Patient being monitored and Timeout performed Patient Re-evaluated:Patient Re-evaluated prior to induction Oxygen Delivery Method: Circle system utilized Preoxygenation: Pre-oxygenation with 100% oxygen Induction Type: IV induction Ventilation: Mask ventilation without difficulty Laryngoscope Size: Mac and 4 Grade View: Grade I Tube type: Oral Tube size: 7.5 mm Number of attempts: 1 Airway Equipment and Method: Stylet Placement Confirmation: ETT inserted through vocal cords under direct vision, positive ETCO2, CO2 detector and breath sounds checked- equal and bilateral Secured at: 23 cm Tube secured with: Tape Dental Injury: Teeth and Oropharynx as per pre-operative assessment

## 2022-05-17 NOTE — H&P (Signed)
GASTROENTEROLOGY PROCEDURE H&P NOTE   Primary Care Physician: [name], MD  HPI: Patrick [name] is a 66 y.o. male who presents for EUS/ERCP for biliary stent removal and repeat evaluation of peripancreatic fluid collection s/p IR drainage.  Recent CT earlier in month with improving collection, but there still remains another that may need aspiration pending patient's symptoms.  Past Medical History:  Diagnosis Date   A-fib ([place])    Blood dyscrasia    Blood transfusion    "I've had a bunch of them"   CHF (congestive heart failure) ([place])    followed by hf clinic   Dialysis patient [facility]) 04/2019   AV fistula (right arm), TTS   Dysrhythmia    A-Fib per patient   Gout    Headache(784.0)    "probably weekly" (01/13/2014)   Legally blind    Mitral regurgitation    Pulmonary hypertension (HCC)    Shortness of breath dyspnea    Sickle cell disease, type SS (HCC)    Swelling abdomen    Swelling of extremity, left    Swelling of extremity, right    Tricuspid regurgitation    Past Surgical History:  Procedure Laterality Date   A/V FISTULAGRAM Right 07/29/2020   Procedure: A/V FISTULAGRAM - Right Arm;  Surgeon: Angelia Mould, MD;  Location: [place] CV LAB;  Service: Cardiovascular;  Laterality: Right;   BASCILIC VEIN TRANSPOSITION Right 04/12/2014   Procedure: BASILIC VEIN TRANSPOSITION;  Surgeon: Elam Dutch, MD;  Location: [place];  Service: Vascular;  Laterality: Right;   BILIARY STENT PLACEMENT  04/04/2022   Procedure: BILIARY STENT PLACEMENT;  Surgeon: Ladene Artist, MD;  Location: [place];  Service: Gastroenterology;;   BIOPSY  08/04/2020   Procedure: BIOPSY;  Surgeon: Ladene Artist, MD;  Location: [place];  Service: Gastroenterology;;   BIOPSY  06/28/2021   Procedure: BIOPSY;  Surgeon: Jerene Bears, MD;  Location: [place];  Service: Gastroenterology;;   BIOPSY  04/02/2022   Procedure: BIOPSY;  Surgeon: Irving Copas., MD;  Location: [place];  Service: Gastroenterology;;   BOWEL RESECTION N/A 05/24/2021   Procedure: SMALL BOWEL RESECTION;  Surgeon: Rolm Bookbinder, MD;  Location: [place];  Service: General;  Laterality: N/A;   CARDIOVERSION N/A 06/05/2018   Procedure: CARDIOVERSION;  Surgeon: Jolaine Artist, MD;  Location: [place];  Service: Cardiovascular;  Laterality: N/A;   CARDIOVERSION N/A 10/06/2019   Procedure: CARDIOVERSION;  Surgeon: Jolaine Artist, MD;  Location: [facility] ENDOSCOPY;  Service: Cardiovascular;  Laterality: N/A;   CHOLECYSTECTOMY N/A 05/24/2021   Procedure: OPEN CHOLECYSTECTOMY;  Surgeon: Rolm Bookbinder, MD;  Location: [place];  Service: General;  Laterality: N/A;   COLONOSCOPY WITH PROPOFOL N/A 09/24/2017   Procedure: COLONOSCOPY WITH PROPOFOL;  Surgeon: Jackquline Denmark, MD;  Location: [place];  Service: Endoscopy;  Laterality: N/A;   ERCP N/A 04/04/2022   Procedure: ENDOSCOPIC RETROGRADE CHOLANGIOPANCREATOGRAPHY (ERCP);  Surgeon: Ladene Artist, MD;  Location: [place];  Service: Gastroenterology;  Laterality: N/A;   ESOPHAGOGASTRODUODENOSCOPY N/A 09/19/2017   Procedure: ESOPHAGOGASTRODUODENOSCOPY (EGD);  Surgeon: Jackquline Denmark, MD;  Location: [facility] ENDOSCOPY;  Service: Endoscopy;  Laterality: N/A;   ESOPHAGOGASTRODUODENOSCOPY N/A 05/29/2021   Procedure: ESOPHAGOGASTRODUODENOSCOPY (EGD);  Surgeon: Milus Banister, MD;  Location: [facility] ENDOSCOPY;  Service: Gastroenterology;  Laterality: N/A;   ESOPHAGOGASTRODUODENOSCOPY (EGD) WITH PROPOFOL N/A 08/04/2020   Procedure: ESOPHAGOGASTRODUODENOSCOPY (EGD) WITH PROPOFOL;  Surgeon: Ladene Artist, MD;  Location: [place];  Service: Gastroenterology;  Laterality:  N/A;   ESOPHAGOGASTRODUODENOSCOPY (EGD) WITH PROPOFOL N/A 06/28/2021   Procedure: ESOPHAGOGASTRODUODENOSCOPY (EGD) WITH PROPOFOL;  Surgeon: Jerene Bears, MD;  Location: [place];  Service: Gastroenterology;  Laterality: N/A;    ESOPHAGOGASTRODUODENOSCOPY (EGD) WITH PROPOFOL N/A 08/10/2021   Procedure: ESOPHAGOGASTRODUODENOSCOPY (EGD) WITH PROPOFOL;  Surgeon: Jackquline Denmark, MD;  Location: [place];  Service: Gastroenterology;  Laterality: N/A;   ESOPHAGOGASTRODUODENOSCOPY (EGD) WITH PROPOFOL N/A 08/02/2021   Procedure: ESOPHAGOGASTRODUODENOSCOPY (EGD) WITH PROPOFOL;  Surgeon: Lavena Bullion, DO;  Location: [place];  Service: Gastroenterology;  Laterality: N/A;   ESOPHAGOGASTRODUODENOSCOPY (EGD) WITH PROPOFOL N/A 04/02/2022   Procedure: ESOPHAGOGASTRODUODENOSCOPY (EGD) WITH PROPOFOL;  Surgeon: Rush Landmark Telford Nab., MD;  Location: [place];  Service: Gastroenterology;  Laterality: N/A;   FINE NEEDLE ASPIRATION  04/02/2022   Procedure: FINE NEEDLE ASPIRATION (FNA) LINEAR;  Surgeon: Irving Copas., MD;  Location: [facility] ENDOSCOPY;  Service: Gastroenterology;;   HEMOSTASIS CLIP PLACEMENT  05/29/2021   Procedure: HEMOSTASIS CLIP PLACEMENT;  Surgeon: Milus Banister, MD;  Location: [facility] ENDOSCOPY;  Service: Gastroenterology;;   HOT HEMOSTASIS N/A 05/29/2021   Procedure: HOT HEMOSTASIS (ARGON PLASMA COAGULATION/BICAP);  Surgeon: Milus Banister, MD;  Location: [facility] ENDOSCOPY;  Service: Gastroenterology;  Laterality: N/A;   HOT HEMOSTASIS N/A 06/28/2021   Procedure: HOT HEMOSTASIS (ARGON PLASMA COAGULATION/BICAP);  Surgeon: Jerene Bears, MD;  Location: [facility] ENDOSCOPY;  Service: Gastroenterology;  Laterality: N/A;   HOT HEMOSTASIS N/A 08/10/2021   Procedure: HOT HEMOSTASIS (ARGON PLASMA COAGULATION/BICAP);  Surgeon: Jackquline Denmark, MD;  Location: [facility] ENDOSCOPY;  Service: Gastroenterology;  Laterality: N/A;   HOT HEMOSTASIS N/A 08/02/2021   Procedure: HOT HEMOSTASIS (ARGON PLASMA COAGULATION/BICAP);  Surgeon: Lavena Bullion, DO;  Location: [facility] ENDOSCOPY;  Service: Gastroenterology;  Laterality: N/A;   IR PERC TUN PERIT CATH WO PORT S&I /IMAG  05/23/2021   IR REMOVAL TUN CV CATH W/O FL  06/10/2021   IR US  GUIDE VASC ACCESS RIGHT  05/23/2021   LAPAROSCOPY N/A 05/24/2021   Procedure: LAPAROSCOPY DIAGNOSTIC;  Surgeon: Rolm Bookbinder, MD;  Location: [place];  Service: General;  Laterality: N/A;   LAPAROTOMY N/A 05/24/2021   Procedure: EXPLORATORY LAPAROTOMY;  Surgeon: Rolm Bookbinder, MD;  Location: [place];  Service: General;  Laterality: N/A;   LEFT AND RIGHT HEART CATHETERIZATION WITH CORONARY ANGIOGRAM N/A 06/11/2011   Procedure: LEFT AND RIGHT HEART CATHETERIZATION WITH CORONARY ANGIOGRAM;  Surgeon: Larey Dresser, MD;  Location: [facility] CATH LAB;  Service: Cardiovascular;  Laterality: N/A;   PERIPHERAL VASCULAR BALLOON ANGIOPLASTY Right 07/29/2020   Procedure: PERIPHERAL VASCULAR BALLOON ANGIOPLASTY;  Surgeon: Angelia Mould, MD;  Location: [employer] CV LAB;  Service: Cardiovascular;  Laterality: Right;  arm fistula   REVISON OF ARTERIOVENOUS FISTULA Right 08/08/2020   Procedure: ANEURYSM EXCISION OF RIGHT UPPER EXTREMITY ARTERIOVENOUS FISTULA;  Surgeon: Angelia Mould, MD;  Location: [place];  Service: Vascular;  Laterality: Right;   REVISON OF ARTERIOVENOUS FISTULA Right 03/21/2022   Procedure: EXCISION OF ULCERATED SKIN OF RIGHT ARTERIOVENOUS FISTULA REVISION;  Surgeon: Marty Heck, MD;  Location: [facility] OR;  Service: Vascular;  Laterality: Right;   SCLEROTHERAPY  05/29/2021   Procedure: Clide Deutscher;  Surgeon: Milus Banister, MD;  Location: [facility] ENDOSCOPY;  Service: Gastroenterology;;   Joan Mayans  04/04/2022   Procedure: Joan Mayans;  Surgeon: Ladene Artist, MD;  Location: [facility] ENDOSCOPY;  Service: Gastroenterology;;   Lavell Islam REMOVAL  05/29/2021   Procedure: STENT REMOVAL;  Surgeon: Milus Banister, MD;  Location: [place];  Service: Gastroenterology;;  TEE WITHOUT CARDIOVERSION N/A 12/27/2015   Procedure: TRANSESOPHAGEAL ECHOCARDIOGRAM (TEE);  Surgeon: Sueanne Margarita, MD;  Location: [place];  Service: Cardiovascular;  Laterality: N/A;   UPPER  ESOPHAGEAL ENDOSCOPIC ULTRASOUND (EUS) N/A 04/02/2022   Procedure: UPPER ESOPHAGEAL ENDOSCOPIC ULTRASOUND (EUS);  Surgeon: Irving Copas., MD;  Location: [place];  Service: Gastroenterology;  Laterality: N/A;   No current facility-administered medications for this encounter.   No current facility-administered medications for this encounter. No Known Allergies Family History  Problem Relation Age of Onset   Alcohol abuse Mother    Liver disease Mother    Cirrhosis Mother    Heart attack Mother    Social History   Socioeconomic History   Marital status: Divorced    Spouse name: Not on file   Number of children: Not on file   Years of education: Not on file   Highest education level: Not on file  Occupational History   Occupation: disabled  Tobacco Use   Smoking status: Former    Packs/day: 0.50    Years: 41.00    Total pack years: 20.50    Types: Cigarettes    Quit date: 12/08/2015    Years since quitting: 6.4   Smokeless tobacco: Never  Vaping Use   Vaping Use: Never used  Substance and Sexual Activity   Alcohol use: Not Currently    Alcohol/week: 3.0 standard drinks of alcohol    Types: 1 Glasses of wine, 2 Cans of beer per week    Comment: none since starting dialysis 06/2021   Drug use: No   Sexual activity: Not Currently  Other Topics Concern   Not on file  Social History Narrative   Not on file   Social Determinants of Health   Financial Resource Strain: Low Risk  (06/02/2021)   Overall Financial Resource Strain (CARDIA)    Difficulty of Paying Living Expenses: Not very hard  Food Insecurity: No Food Insecurity (04/01/2022)   Hunger Vital Sign    Worried About Running Out of Food in the Last Year: Never true    [place] in the Last Year: Never true  Transportation Needs: No Transportation Needs (04/01/2022)   PRAPARE - Hydrologist (Medical): No    Lack of Transportation (Non-Medical): No  Physical  Activity: Sufficiently Active (10/01/2019)   Exercise Vital Sign    Days of Exercise per Week: 5 days    Minutes of Exercise per Session: 90 min  Stress: Not on file  Social Connections: Not on file  Intimate Partner Violence: Not At Risk (04/01/2022)   Humiliation, Afraid, Rape, and Kick questionnaire    Fear of Current or Ex-Partner: No    Emotionally Abused: No    Physically Abused: No    Sexually Abused: No    Physical Exam: There were no vitals filed for this visit. There is no height or weight on file to calculate BMI. GEN: NAD EYE: Sclerae anicteric ENT: MMM CV: Non-tachycardic GI: Soft, NT/ND NEURO:  Alert & Oriented x 3  Lab Results: No results for input(s): "WBC", "HGB", "HCT", "PLT" in the last 72 hours. BMET No results for input(s): "NA", "K", "CL", "CO2", "GLUCOSE", "BUN", "CREATININE", "CALCIUM" in the last 72 hours. LFT No results for input(s): "PROT", "ALBUMIN", "AST", "ALT", "ALKPHOS", "BILITOT", "BILIDIR", "IBILI" in the last 72 hours. PT/INR No results for input(s): "LABPROT", "INR" in the last 72 hours.   Impression / Plan: This is a 66 y.o.male who presents  for EUS/ERCP for biliary stent removal and repeat evaluation of peripancreatic fluid collection s/p IR drainage.  Recent CT earlier in month with improving collection, but there still remains another that may need aspiration pending patient's symptoms.  The risks of an EUS including intestinal perforation, bleeding, infection, aspiration, and medication effects were discussed as was the possibility it may not give a definitive diagnosis if a biopsy is performed.  When a biopsy of the pancreas is done as part of the EUS, there is an additional risk of pancreatitis at the rate of about 1-2%.  It was explained that procedure related pancreatitis is typically mild, although it can be severe and even life threatening, which is why we do not perform random pancreatic biopsies and only biopsy a lesion/area we feel  is concerning enough to warrant the risk.   The risks of an ERCP were discussed at length, including but not limited to the risk of perforation, bleeding, abdominal pain, post-ERCP pancreatitis (while usually mild can be severe and even life threatening).   The risks and benefits of endoscopic evaluation/treatment were discussed with the patient and/or family; these include but are not limited to the risk of perforation, infection, bleeding, missed lesions, lack of diagnosis, severe illness requiring hospitalization, as well as anesthesia and sedation related illnesses.  The patient's history has been reviewed, patient examined, no change in status, and deemed stable for procedure.  The patient and/or family is agreeable to proceed.    Justice Britain, MD [facility] Office # [phone]

## 2022-05-17 NOTE — Anesthesia Preprocedure Evaluation (Addendum)
Anesthesia Evaluation  Patient identified by MRN, date of birth, ID band Patient awake    Reviewed: Allergy & Precautions, NPO status , Patient's Chart, lab work & pertinent test results  Airway Mallampati: II  TM Distance: >3 FB Neck ROM: Full    Dental  (+) Poor Dentition, Missing, Chipped, Dental Advisory Given,    Pulmonary COPD, former smoker   Pulmonary exam normal breath sounds clear to auscultation       Cardiovascular hypertension, Pt. on medications +CHF  Normal cardiovascular exam+ dysrhythmias Atrial Fibrillation + Valvular Problems/Murmurs (mod MR) MR  Rhythm:Regular Rate:Normal  TTE 2023  1. Left ventricular ejection fraction, by estimation, is 40 to 45%. The  left ventricle has mildly decreased function. The left ventricle  demonstrates global hypokinesis. The left ventricular internal cavity size  was mildly to moderately dilated. Left  ventricular diastolic parameters are indeterminate.   2. Right ventricular systolic function is mildly reduced. The right  ventricular size is moderately-to-severely enlarged. There is moderately  elevated pulmonary artery systolic pressure. The estimated right  ventricular systolic pressure is 47.8 mmHg.   3. Left atrial size was severely dilated.   4. Right atrial size was severely dilated.   5. The mitral valve is abnormal. There is at least moderate, mainly  posteriorly directed (although there are multiple MR jets)  functional/secondary mitral regurgitation likely due to LA and LV  dilation.   6. Tricuspid valve regurgitation is severe.   7. The aortic valve is tricuspid. There is mild calcification of the  aortic valve. There is mild thickening of the aortic valve. Aortic valve  regurgitation is not visualized. Aortic valve sclerosis/calcification is  present, without any evidence of  aortic stenosis.   8. The inferior vena cava is dilated in size with <50% respiratory   variability, suggesting right atrial pressure of 15 mmHg.     Neuro/Psych  Headaches  negative psych ROS   GI/Hepatic Neg liver ROS, hiatal hernia,,,  Endo/Other  negative endocrine ROS    Renal/GU ESRF and DialysisRenal disease (diaylsis TTS, just completed dialysis today)  negative genitourinary   Musculoskeletal negative musculoskeletal ROS (+)    Abdominal   Peds  Hematology  (+) Blood dyscrasia, Sickle cell anemia and anemia   Anesthesia Other Findings   Reproductive/Obstetrics                             Anesthesia Physical Anesthesia Plan  ASA: 3  Anesthesia Plan: General   Post-op Pain Management: Minimal or no pain anticipated   Induction: Intravenous  PONV Risk Score and Plan: 2 and Midazolam, Dexamethasone and Ondansetron  Airway Management Planned: Oral ETT  Additional Equipment:   Intra-op Plan:   Post-operative Plan: Extubation in OR  Informed Consent: I have reviewed the patients History and Physical, chart, labs and discussed the procedure including the risks, benefits and alternatives for the proposed anesthesia with the patient or authorized representative who has indicated his/her understanding and acceptance.     Dental advisory given  Plan Discussed with: CRNA  Anesthesia Plan Comments:        Anesthesia Quick Evaluation

## 2022-05-17 NOTE — Op Note (Signed)
[facility] Patient Name: Patrick [name] Procedure Date: 05/17/2022 MRN: [id number] Attending MD: Justice Britain , MD, [id number] Date of Birth: 1955/07/29 CSN: [id number] Age: 66 Admit Type: Outpatient Procedure:                Upper EUS Indications:              Pancreatic cyst on CT scan Providers:                Justice Britain, MD, Kary Kos RN, RN, Cletis Athens, Technician Referring MD:             Pricilla Riffle. Fuller Plan, MD, Lajuan Lines. Pyrtle, MD Medicines:                General Anesthesia, Cipro 233 mg IV Complications:            No immediate complications. Estimated Blood Loss:     Estimated blood loss was minimal. Procedure:                Pre-Anesthesia Assessment:                           - Prior to the procedure, a History and Physical                            was performed, and patient medications and                            allergies were reviewed. The patient's tolerance of                            previous anesthesia was also reviewed. The risks                            and benefits of the procedure and the sedation                            options and risks were discussed with the patient.                            All questions were answered, and informed consent                            was obtained. Prior Anticoagulants: The patient has                            taken no anticoagulant or antiplatelet agents. ASA                            Grade Assessment: III - A patient with severe                            systemic disease. After reviewing the risks and  benefits, the patient was deemed in satisfactory                            condition to undergo the procedure.                           After obtaining informed consent, the endoscope was                            passed under direct vision. Throughout the                            procedure, the patient's blood  pressure, pulse, and                            oxygen saturations were monitored continuously. The                            TJF-Q190V ([id number]) Olympus duodenoscope was                            introduced through the mouth, and advanced to the                            second part of duodenum. The GF-[id number] ([id number])                            Olympus linear ultrasound scope was introduced                            through the mouth, and advanced to the duodenum for                            ultrasound examination from the stomach and                            duodenum. The upper EUS was accomplished without                            difficulty. The patient tolerated the procedure. Scope In: Scope Out: Findings:      ENDOSCOPIC FINDING: :      No gross lesions were noted in the entire esophagus.      Extrinsic compression on the stomach was found in the gastric body.      No other gross lesions were noted in the entire examined stomach.      A moderate extrinsic compression was found in the duodenal bulb.      No other gross lesions were noted in the duodenal bulb, in the first       portion of the duodenum and in the second portion of the duodenum.      There was evidence of a patent biliary sphincterotomy at the major       papilla.      A previously placed plastic biliary stent was seen at the major papilla.      ENDOSONOGRAPHIC FINDING: :  An anechoic lesion suggestive of a cyst was identified in the       peripancreatic region. The lesion measured 80 mm by 60 mm in maximal       cross-sectional diameter. There was a single compartment without septae.       The outer wall of the lesion was not seen. There was no associated mass.       There was no internal debris within the fluid-filled cavity. Diagnostic       and therapeutic needle aspiration for fluid was performed. Color Doppler       imaging was utilized prior to needle puncture to confirm a lack of        significant vascular structures within the needle path. One pass was       made with a [employer] expect 19 gauge needle using a       transgastric approach. A stylet was used. The amount of fluid collected       was 450 mL. The fluid was clear, yellow and thin. Sample(s) were sent       for cell count and differential, albumin concentration, lipase, total       protein, cytology, bacterial cultures and Gram stain.      A large collection of fluid, visualized as an arcuate anechoic structure       (125 mm by 60 mm in maximal cross-sectional diameter), was found in the       perihepatic peritoneal space. Diagnostic and therapeutic needle       aspiration for fluid was performed. Color Doppler imaging was utilized       prior to needle puncture to confirm a lack of significant vascular       structures within the needle path. One pass was made with a new [employer] expect 19 gauge needle using a transgastric approach. A       stylet was used. The amount of fluid collected was 350 mL. The fluid was       clear, brown and thin. Sample(s) were sent for albumin concentration,       lipase, total protein, cytology, bacterial cultures and Gram stain. Impression:               EGD impression:                           - No gross lesions in the entire esophagus.                           - Extrinsic compression in the gastric body. No                            other gross lesions in the entire stomach.                           - Duodenal extrinsic compression in the bulb. No                            other gross lesions in the duodenal bulb, in the                            first portion of the duodenum  and in the second                            portion of the duodenum.                           - Patent biliary sphincterotomy was found. Plastic                            biliary stent at the ampulla.                           EUS impression:                           - A  cystic lesion was seen in the peripancreatic                            region. Tissue has not been obtained. However, the                            endosonographic appearance is suggestive of being                            more loculated ascites. Fine needle aspiration for                            fluid performed.                           - Perihepatic fluid was seen. Tissue has not been                            obtained. However the endosonographic appearance is                            suggestive of being loculated ascites. Fine-needle                            aspiration for fluid performed. Moderate Sedation:      Not Applicable - Patient had care per Anesthesia. Recommendation:           - Proceed to scheduled ERCP.                           - Observe patient's clinical course.                           - Await cytology results and fluid studies.                           - Repeat CTAP with IV/PO Contrast in 3-4 weeks.                           - Recommend IR referral for ascites (noted on  recent CT) drainage and analysis.                           - The findings and recommendations were discussed                            with the patient.                           - The findings and recommendations were discussed                            with the patient's family. Procedure Code(s):        --- Professional ---                           [phone], Esophagogastroduodenoscopy, flexible,                            transoral; with transendoscopic ultrasound-guided                            intramural or transmural fine needle                            aspiration/biopsy(s), (includes endoscopic                            ultrasound examination limited to the esophagus,                            stomach or duodenum, and adjacent structures) Diagnosis Code(s):        --- Professional ---                           K31.89, Other diseases of stomach  and duodenum                           Z98.890, Other specified postprocedural states                           K86.2, Cyst of pancreas                           R18.8, Other ascites CPT copyright 2022 American Medical Association. All rights reserved. The codes documented in this report are preliminary and upon coder review may  be revised to meet current compliance requirements. Justice Britain, MD 05/17/2022 1:35:16 PM Number of Addenda: 0

## 2022-05-17 NOTE — Transfer of Care (Signed)
Immediate Anesthesia Transfer of Care Note  Patient: Patrick [name]  Procedure(s) Performed: ENDOSCOPIC RETROGRADE CHOLANGIOPANCREATOGRAPHY (ERCP) WITH PROPOFOL UPPER ENDOSCOPIC ULTRASOUND (EUS) RADIAL FINE NEEDLE ASPIRATION (FNA) LINEAR STENT REMOVAL REMOVAL OF STONES  Patient Location: PACU and Endoscopy Unit  Anesthesia Type:General  Level of Consciousness: awake, alert , and patient cooperative  Airway & Oxygen Therapy: Patient Spontanous Breathing and Patient connected to nasal cannula oxygen  Post-op Assessment: Report given to RN and Post -op Vital signs reviewed and stable  Post vital signs: Reviewed and stable  Last Vitals:  Vitals Value Taken Time  BP 100/63 1412 05/17/22  Temp    Pulse 99 1412 05/17/22  Resp    SpO2 100% 1412 05/17/22    Last Pain:  Vitals:   05/17/22 1026  TempSrc: Temporal  PainSc: 2          Complications: No notable events documented.

## 2022-05-17 NOTE — Op Note (Signed)
[facility] Patient Name: Patrick [name] Procedure Date: 05/17/2022 MRN: [id number] Attending MD: Justice Britain , MD, [id number] Date of Birth: Nov 11, 1955 CSN: [id number] Age: 66 Admit Type: Outpatient Procedure:                ERCP Indications:              Biliary stent removal Providers:                Justice Britain, MD, Kary Kos RN, RN, Cletis Athens, Technician Referring MD:             Pricilla Riffle. Fuller Plan, MD, Lajuan Lines. Pyrtle, MD, [name] MD, MD Medicines:                General Anesthesia, Diclofenac 100 mg rectal,                            Glucagon 0.25 mg IV, Ciprofloxacin was administered                            during EUS portion of procedure Complications:            No immediate complications. Estimated Blood Loss:     Estimated blood loss was minimal. Procedure:                Pre-Anesthesia Assessment:                           - Prior to the procedure, a History and Physical                            was performed, and patient medications and                            allergies were reviewed. The patient's tolerance of                            previous anesthesia was also reviewed. The risks                            and benefits of the procedure and the sedation                            options and risks were discussed with the patient.                            All questions were answered, and informed consent                            was obtained. Prior Anticoagulants: The patient has  taken no anticoagulant or antiplatelet agents. ASA                            Grade Assessment: III - A patient with severe                            systemic disease. After reviewing the risks and                            benefits, the patient was deemed in satisfactory                            condition to undergo the procedure.                            After obtaining informed consent, the scope was                            passed under direct vision. Throughout the                            procedure, the patient's blood pressure, pulse, and                            oxygen saturations were monitored continuously. The                            TJF-Q190V ([id number]) Olympus duodenoscope was                            introduced through the mouth, and used to inject                            contrast into and used to cannulate the bile duct.                            The ERCP was accomplished without difficulty. The                            patient tolerated the procedure. Scope In: Scope Out: Findings:      A scout film of the abdomen was obtained. Surgical clips, consistent       with previous cholecystectomy, were seen in the area of the right upper       quadrant of the abdomen. One stent ending in the main bile duct was seen.      A biliary sphincterotomy had been performed. The sphincterotomy appeared       open. One plastic biliary stent originating in the biliary tree was       emerging from the major papilla. The stent was visibly patent. One stent       was removed from the biliary tree using a snare.      A short 0.035 inch Soft Jagwire was passed into the biliary tree. The       Hydratome sphincterotome was passed over the guidewire and the  bile duct       was then deeply cannulated. Contrast was injected. I personally       interpreted the bile duct images. Ductal flow of contrast was adequate.       Image quality was adequate. Contrast extended to the hepatic ducts.       Opacification of the entire biliary tree except for the gallbladder was       successful. The middle third of the main bile duct and upper third of       the main bile duct were mildly dilated. The largest diameter was 13 mm.       To discover objects, the biliary tree was swept with a retrieval       balloon. Sludge was swept from the  duct. An occlusion cholangiogram was       performed that showed no further significant biliary pathology and no       evidence of a biliary leak.      A pancreatogram was not performed.      The duodenoscope was withdrawn from the patient. Impression:               - Prior biliary sphincterotomy appeared open.                           - One visibly patent stent from the biliary tree                            was seen in the major papilla. This was removed                            from the patient.                           - The upper third of the main bile duct and middle                            third of the main bile duct were mildly dilated.                           - One stent was removed from the biliary tree.                           - The biliary tree was swept and sludge was found.                           - No evidence of a biliary leak is present. Moderate Sedation:      Not Applicable - Patient had care per Anesthesia. Recommendation:           - The patient will be observed post-procedure,                            until all discharge criteria are met.                           - Discharge patient to home.                           -  Patient has a contact number available for                            emergencies. The signs and symptoms of potential                            delayed complications were discussed with the                            patient. Return to normal activities tomorrow.                            Written discharge instructions were provided to the                            patient.                           - Watch for pancreatitis, bleeding, perforation,                            and cholangitis.                           - Check CBC/CMP/INR today.                           - Observe patient's clinical course.                           - I will work with arranging a follow-up in clinic                            with patient's primary  gastroenterologist or one of                            our APP's. This will be for further follow-up after                            patient has undergone ascites tap (as evidenced                            with significant ascites in the abdominal cavity on                            CT scan from November) and for repeat imaging in                            approximately 3 to 4 weeks to see how the                            perihepatic and peripancreatic fluid collections                            (which look  to be more ascitic fluid in nature on                            today's EUS?"see EUS report for more details) look                            post aspiration.                           - The findings and recommendations were discussed                            with the patient.                           - The findings and recommendations were discussed                            with the patient's family. Procedure Code(s):        --- Professional ---                           [phone], Endoscopic retrograde                            cholangiopancreatography (ERCP); with removal of                            foreign body(s) or stent(s) from biliary/pancreatic                            duct(s)                           43264, Endoscopic retrograde                            cholangiopancreatography (ERCP); with removal of                            calculi/debris from biliary/pancreatic duct(s) Diagnosis Code(s):        --- Professional ---                           Z96.89, Presence of other specified functional                            implants                           Z46.59, Encounter for fitting and adjustment of                            other gastrointestinal appliance and device                           K83.8, Other specified diseases of biliary tract CPT copyright 2022 American Medical  Association. All rights reserved. The codes documented in this report are  preliminary and upon coder review may  be revised to meet current compliance requirements. Justice Britain, MD 05/17/2022 2:03:09 PM Number of Addenda: 0

## 2022-05-17 NOTE — Discharge Instructions (Signed)
YOU HAD AN ENDOSCOPIC PROCEDURE TODAY: Refer to the procedure report and other information in the discharge instructions given to you for any specific questions about what was found during the examination. If this information does not answer your questions, please call [place] office at [phone] to clarify.  ° °YOU SHOULD EXPECT: Some feelings of bloating in the abdomen. Passage of more gas than usual. Walking can help get rid of the air that was put into your GI tract during the procedure and reduce the bloating. If you had a lower endoscopy (such as a colonoscopy or flexible sigmoidoscopy) you may notice spotting of blood in your stool or on the toilet paper. Some abdominal soreness may be present for a day or two, also. ° °DIET: Your first meal following the procedure should be a light meal and then it is ok to progress to your normal diet. A half-sandwich or bowl of soup is an example of a good first meal. Heavy or fried foods are harder to digest and may make you feel nauseous or bloated. Drink plenty of fluids but you should avoid alcoholic beverages for 24 hours. If you had a esophageal dilation, please see attached instructions for diet.   ° °ACTIVITY: Your care partner should take you home directly after the procedure. You should plan to take it easy, moving slowly for the rest of the day. You can resume normal activity the day after the procedure however YOU SHOULD NOT DRIVE, use power tools, machinery or perform tasks that involve climbing or major physical exertion for 24 hours (because of the sedation medicines used during the test).  ° °SYMPTOMS TO REPORT IMMEDIATELY: °A gastroenterologist can be reached at any hour. Please call [phone]  for any of the following symptoms:  °Following lower endoscopy (colonoscopy, flexible sigmoidoscopy) °Excessive amounts of blood in the stool  °Significant tenderness, worsening of abdominal pains  °Swelling of the abdomen that is new, acute  °Fever of 100° or  higher  °Following upper endoscopy (EGD, EUS, ERCP, esophageal dilation) °Vomiting of blood or coffee ground material  °New, significant abdominal pain  °New, significant chest pain or pain under the shoulder blades  °Painful or persistently difficult swallowing  °New shortness of breath  °Black, tarry-looking or red, bloody stools ° °FOLLOW UP:  °If any biopsies were taken you will be contacted by phone or by letter within the next 1-3 weeks. Call [phone]  if you have not heard about the biopsies in 3 weeks.  °Please also call with any specific questions about appointments or follow up tests. ° °

## 2022-05-18 ENCOUNTER — Encounter (HOSPITAL_COMMUNITY): Payer: Self-pay | Admitting: Gastroenterology

## 2022-05-18 LAB — ALBUMIN, FLUID (OTHER)
Albumin, Body Fluid Other: 2.6 g/dL
Albumin, Body Fluid Other: 2.8 g/dL

## 2022-05-18 NOTE — Anesthesia Postprocedure Evaluation (Signed)
Anesthesia Post Note  Patient: Camren D Chervenak  Procedure(s) Performed: ENDOSCOPIC RETROGRADE CHOLANGIOPANCREATOGRAPHY (ERCP) WITH PROPOFOL UPPER ENDOSCOPIC ULTRASOUND (EUS) RADIAL FINE NEEDLE ASPIRATION (FNA) LINEAR STENT REMOVAL REMOVAL OF STONES ESOPHAGOGASTRODUODENOSCOPY (EGD) WITH PROPOFOL     Patient location during evaluation: PACU Anesthesia Type: General Level of consciousness: awake and alert Pain management: pain level controlled Vital Signs Assessment: post-procedure vital signs reviewed and stable Respiratory status: spontaneous breathing, nonlabored ventilation, respiratory function stable and patient connected to nasal cannula oxygen Cardiovascular status: blood pressure returned to baseline and stable Postop Assessment: no apparent nausea or vomiting Anesthetic complications: no  No notable events documented.  Last Vitals:  Vitals:   05/17/22 1450 05/17/22 1504  BP: 94/65 (!) 90/55  Pulse: 81 77  Resp: 14 11  Temp:    SpO2: 96% 93%    Last Pain:  Vitals:   05/17/22 1504  TempSrc:   PainSc: 0-No pain                 Kardell Virgil L Gracemarie Skeet

## 2022-05-21 LAB — BODY FLUID CULTURE W GRAM STAIN
Culture: NO GROWTH
Culture: NO GROWTH
Gram Stain: NONE SEEN

## 2022-05-21 LAB — CYTOLOGY - NON PAP

## 2022-05-22 LAB — ANAEROBIC CULTURE W GRAM STAIN

## 2022-05-23 LAB — LD, BODY FLUID (OTHER)
LD, Body Fluid: 171 IU/L
LD, Body Fluid: 94 IU/L

## 2022-05-24 ENCOUNTER — Other Ambulatory Visit: Payer: Self-pay

## 2022-05-24 DIAGNOSIS — R188 Other ascites: Secondary | ICD-10-CM

## 2022-05-24 DIAGNOSIS — K862 Cyst of pancreas: Secondary | ICD-10-CM

## 2022-05-24 DIAGNOSIS — K839 Disease of biliary tract, unspecified: Secondary | ICD-10-CM

## 2022-05-25 LAB — LIPASE, FLUID
Lipase-Fluid: 30 U/L
Lipase-Fluid: 61 U/L

## 2022-05-28 ENCOUNTER — Telehealth: Payer: Self-pay | Admitting: Gastroenterology

## 2022-05-28 NOTE — Telephone Encounter (Signed)
The pt returned call see alternate results note

## 2022-05-28 NOTE — Telephone Encounter (Signed)
Patient is returning call. Please advise? 

## 2022-05-31 ENCOUNTER — Other Ambulatory Visit: Payer: Self-pay | Admitting: Family Medicine

## 2022-05-31 ENCOUNTER — Telehealth: Payer: Self-pay | Admitting: Internal Medicine

## 2022-05-31 DIAGNOSIS — D571 Sickle-cell disease without crisis: Secondary | ICD-10-CM

## 2022-05-31 MED ORDER — OXYCODONE HCL 10 MG PO TABS: 10.0000 mg | ORAL_TABLET | Freq: Four times a day (QID) | ORAL | 0 refills | Status: DC | PRN

## 2022-05-31 NOTE — Progress Notes (Signed)
Reviewed PDMP substance reporting system prior to prescribing opiate medications. No inconsistencies noted.  Meds ordered this encounter  Medications   Oxycodone HCl 10 MG TABS    Sig: Take 1 tablet (10 mg total) by mouth every 6 (six) hours as needed.    Dispense:  60 tablet    Refill:  0    Order Specific Question:   Supervising Provider    Answer:   JEGEDE, OLUGBEMIGA E [[id number]]   Siarra Gilkerson Moore Rylan Bernard  APRN, MSN, FNP-C [facility] [address]  , [place] [address] [phone]  

## 2022-05-31 NOTE — Telephone Encounter (Signed)
Caller & Relationship to patient:  MRN #  [id number]   Call Back Number:   Date of Last Office Visit: 05/15/2022     Date of Next Office Visit: Visit date not found    Medication(s) to be Refilled: Folic acid, Oxy, Hydroyurea  Preferred Pharmacy:   ** Please notify patient to allow 48-72 hours to process** **Let patient know to contact pharmacy at the end of the day to make sure medication is ready. ** **If patient has not been seen in a year or longer, book an appointment **Advise to use MyChart for refill requests OR to contact their pharmacy

## 2022-06-01 ENCOUNTER — Other Ambulatory Visit: Payer: Self-pay | Admitting: Internal Medicine

## 2022-06-01 DIAGNOSIS — D571 Sickle-cell disease without crisis: Secondary | ICD-10-CM

## 2022-06-01 MED ORDER — FOLIC ACID 1 MG PO TABS: 1.0000 mg | ORAL_TABLET | Freq: Every day | ORAL | 1 refills | Status: DC

## 2022-06-01 MED ORDER — HYDROXYUREA 500 MG PO CAPS: 500.0000 mg | ORAL_CAPSULE | Freq: Every day | ORAL | 1 refills | Status: DC

## 2022-06-04 ENCOUNTER — Emergency Department (HOSPITAL_BASED_OUTPATIENT_CLINIC_OR_DEPARTMENT_OTHER): Payer: PPO

## 2022-06-04 ENCOUNTER — Telehealth: Payer: Self-pay | Admitting: Internal Medicine

## 2022-06-04 ENCOUNTER — Emergency Department (HOSPITAL_BASED_OUTPATIENT_CLINIC_OR_DEPARTMENT_OTHER): Payer: PPO | Admitting: Radiology

## 2022-06-04 ENCOUNTER — Encounter (HOSPITAL_BASED_OUTPATIENT_CLINIC_OR_DEPARTMENT_OTHER): Payer: Self-pay

## 2022-06-04 ENCOUNTER — Other Ambulatory Visit: Payer: Self-pay

## 2022-06-04 ENCOUNTER — Other Ambulatory Visit: Payer: Self-pay | Admitting: Family Medicine

## 2022-06-04 ENCOUNTER — Encounter (HOSPITAL_COMMUNITY): Payer: Self-pay

## 2022-06-04 ENCOUNTER — Emergency Department (HOSPITAL_BASED_OUTPATIENT_CLINIC_OR_DEPARTMENT_OTHER)
Admission: EM | Admit: 2022-06-04 | Discharge: 2022-06-04 | Disposition: A | Discharge status: Home / Self Care | Payer: PPO | Attending: Emergency Medicine | Admitting: Emergency Medicine

## 2022-06-04 DIAGNOSIS — D5700 Hb-SS disease with crisis, unspecified: Secondary | ICD-10-CM | POA: Insufficient documentation

## 2022-06-04 DIAGNOSIS — M25461 Effusion, right knee: Secondary | ICD-10-CM | POA: Insufficient documentation

## 2022-06-04 DIAGNOSIS — M25561 Pain in right knee: Secondary | ICD-10-CM | POA: Insufficient documentation

## 2022-06-04 DIAGNOSIS — M545 Low back pain, unspecified: Secondary | ICD-10-CM | POA: Insufficient documentation

## 2022-06-04 DIAGNOSIS — R109 Unspecified abdominal pain: Secondary | ICD-10-CM

## 2022-06-04 DIAGNOSIS — R1084 Generalized abdominal pain: Secondary | ICD-10-CM | POA: Insufficient documentation

## 2022-06-04 DIAGNOSIS — M546 Pain in thoracic spine: Secondary | ICD-10-CM | POA: Insufficient documentation

## 2022-06-04 DIAGNOSIS — D571 Sickle-cell disease without crisis: Secondary | ICD-10-CM

## 2022-06-04 LAB — CBC WITH DIFFERENTIAL/PLATELET
Abs Immature Granulocytes: 0.08 10*3/uL — ABNORMAL HIGH (ref 0.00–0.07)
Basophils Absolute: 0.1 10*3/uL (ref 0.0–0.1)
Basophils Relative: 1 %
Eosinophils Absolute: 0 10*3/uL (ref 0.0–0.5)
Eosinophils Relative: 0 %
HCT: 20.2 % — ABNORMAL LOW (ref 39.0–52.0)
Hemoglobin: 7.4 g/dL — ABNORMAL LOW (ref 13.0–17.0)
Immature Granulocytes: 1 %
Lymphocytes Relative: 6 %
Lymphs Abs: 0.5 10*3/uL — ABNORMAL LOW (ref 0.7–4.0)
MCH: 38.3 pg — ABNORMAL HIGH (ref 26.0–34.0)
MCHC: 36.6 g/dL — ABNORMAL HIGH (ref 30.0–36.0)
MCV: 104.7 fL — ABNORMAL HIGH (ref 80.0–100.0)
Monocytes Absolute: 1.3 10*3/uL — ABNORMAL HIGH (ref 0.1–1.0)
Monocytes Relative: 17 %
Neutro Abs: 5.7 10*3/uL (ref 1.7–7.7)
Neutrophils Relative %: 75 %
Platelets: 191 10*3/uL (ref 150–400)
RBC: 1.93 MIL/uL — ABNORMAL LOW (ref 4.22–5.81)
RDW: 21 % — ABNORMAL HIGH (ref 11.5–15.5)
WBC: 7.6 10*3/uL (ref 4.0–10.5)
nRBC: 4.6 % — ABNORMAL HIGH (ref 0.0–0.2)

## 2022-06-04 LAB — COMPREHENSIVE METABOLIC PANEL
ALT: 13 U/L (ref 0–44)
AST: 22 U/L (ref 15–41)
Albumin: 4 g/dL (ref 3.5–5.0)
Alkaline Phosphatase: 273 U/L — ABNORMAL HIGH (ref 38–126)
Anion gap: 22 — ABNORMAL HIGH (ref 5–15)
BUN: 42 mg/dL — ABNORMAL HIGH (ref 8–23)
CO2: 23 mmol/L (ref 22–32)
Calcium: 8.2 mg/dL — ABNORMAL LOW (ref 8.9–10.3)
Chloride: 86 mmol/L — ABNORMAL LOW (ref 98–111)
Creatinine, Ser: 6.91 mg/dL — ABNORMAL HIGH (ref 0.61–1.24)
GFR, Estimated: 8 mL/min — ABNORMAL LOW (ref >60)
Glucose, Bld: 85 mg/dL (ref 70–99)
Potassium: 3.9 mmol/L (ref 3.5–5.1)
Sodium: 131 mmol/L — ABNORMAL LOW (ref 135–145)
Total Bilirubin: 1.4 mg/dL — ABNORMAL HIGH (ref 0.3–1.2)
Total Protein: 7.9 g/dL (ref 6.5–8.1)

## 2022-06-04 LAB — LIPASE, BLOOD: Lipase: 24 U/L (ref 11–51)

## 2022-06-04 LAB — TROPONIN I (HIGH SENSITIVITY)
Troponin I (High Sensitivity): 40 ng/L — ABNORMAL HIGH (ref <18)
Troponin I (High Sensitivity): 42 ng/L — ABNORMAL HIGH (ref <18)

## 2022-06-04 LAB — PROTIME-INR
INR: 1.3 — ABNORMAL HIGH (ref 0.8–1.2)
Prothrombin Time: 15.7 seconds — ABNORMAL HIGH (ref 11.4–15.2)

## 2022-06-04 LAB — RETICULOCYTES
Immature Retic Fract: 19 % — ABNORMAL HIGH (ref 2.3–15.9)
RBC.: 1.94 MIL/uL — ABNORMAL LOW (ref 4.22–5.81)
Retic Count, Absolute: 99.1 10*3/uL (ref 19.0–186.0)
Retic Ct Pct: 5.1 % — ABNORMAL HIGH (ref 0.4–3.1)

## 2022-06-04 MED ORDER — HYDROMORPHONE HCL 1 MG/ML IJ SOLN: 0.5000 mg | INTRAMUSCULAR | Status: AC

## 2022-06-04 MED ORDER — ALLOPURINOL 100 MG PO TABS: 100.0000 mg | ORAL_TABLET | Freq: Every day | ORAL | 1 refills | Status: DC

## 2022-06-04 MED ORDER — OXYCODONE HCL 5 MG PO TABS
10.0000 mg | ORAL_TABLET | Freq: Once | ORAL | Status: AC
  Administered 2022-06-04: 10 mg via ORAL
  Filled 2022-06-04: qty 2

## 2022-06-04 MED ORDER — HYDROMORPHONE HCL 1 MG/ML IJ SOLN
1.0000 mg | INTRAMUSCULAR | Status: AC
  Administered 2022-06-04: 1 mg via INTRAVENOUS
  Filled 2022-06-04: qty 1

## 2022-06-04 MED ORDER — IOHEXOL 300 MG/ML  SOLN
100.0000 mL | Freq: Once | INTRAMUSCULAR | Status: AC | PRN
  Administered 2022-06-04: 75 mL via INTRAVENOUS

## 2022-06-04 MED ORDER — FOLIC ACID 1 MG PO TABS: 1.0000 mg | ORAL_TABLET | Freq: Every day | ORAL | 1 refills | Status: DC

## 2022-06-04 MED ORDER — TIZANIDINE HCL 4 MG PO TABS: ORAL_TABLET | ORAL | 2 refills | Status: DC

## 2022-06-04 NOTE — ED Provider Notes (Signed)
[facility] DEPT Provider Note   CSN: [id number] Arrival date & time: 06/04/22  0719     History  Chief Complaint  Patient presents with   Sickle Cell Pain Crisis    Patrick [name] is a 66 y.o. male.  HPI 66 year old gentleman with a history of sickle cell presents with a sickle cell crisis but also other pain.  He states that this started 2 days ago on Saturday at dialysis.  He had back pain and has been having some abdominal pain and distention.  He feels like he has more fluid in his abdomen.  No fevers.  Has been having some on and off chest pain as well.  He is having diffuse myalgias which goes along with his sickle cell pain crisis but also having chronic right knee pain and swelling as well.  He is out of his oxycodone as he has not been taking that and is due for refill today.  However he does not take oxycodone every day.  Home Medications Prior to Admission medications   Medication Sig Start Date End Date Taking? Authorizing Provider  acetaminophen (TYLENOL) 500 MG tablet Take 2,000 mg by mouth in the morning, at noon, and at bedtime.    [provider]  allopurinol (ZYLOPRIM) 100 MG tablet TAKE 1 TABLET BY MOUTH EVERY DAY Patient taking differently: Take 100 mg by mouth daily. 02/08/22   Fenton Foy, NP  AURYXIA 1 GM 210 MG(Fe) tablet Take 210 mg by mouth in the morning and at bedtime. 02/11/21   [provider]  benzonatate (TESSALON) 100 MG capsule Take 1 capsule (100 mg total) by mouth 2 (two) times daily as needed for cough. 04/17/22   Renee Rival, FNP  Dextromethorphan-guaiFENesin (MUCINEX COUGH & CHEST CONGEST PO) Take 1 Capful by mouth 3 (three) times daily as needed (cough).    [provider]  diphenhydramine-acetaminophen (TYLENOL PM) 25-500 MG TABS tablet Take 2 tablets by mouth at bedtime as needed (sleep/pain).    [provider]  folic acid (FOLVITE) 1 MG tablet Take 1 tablet (1 mg  total) by mouth daily. 06/01/22   [name], MD  hydroxyurea (HYDREA) 500 MG capsule Take 1 capsule (500 mg total) by mouth daily. 06/01/22   [name], MD  lidocaine-prilocaine (EMLA) cream Apply 1 application. topically Every Tuesday,Thursday,and Saturday with dialysis. 09/08/19   [provider]  Oxycodone HCl 10 MG TABS Take 1 tablet (10 mg total) by mouth every 6 (six) hours as needed. 05/31/22   Dorena Dew, FNP  pantoprazole (PROTONIX) 40 MG tablet Take 1 tablet (40 mg total) by mouth daily. 05/17/22   Mansouraty, Telford Nab., MD  tiZANidine (ZANAFLEX) 4 MG tablet TAKE 1/2 TABLET (2MG  TOTAL) BY MOUTH EVERY 8 HOURS AS NEEDED FOR MUSCLE SPASM 05/02/22   Dorena Dew, FNP  torsemide (DEMADEX) 20 MG tablet Take 20 mg by mouth in the morning, at noon, in the evening, and at bedtime. 09/03/21   [provider]  Vitamin D, Ergocalciferol, (DRISDOL) 1.25 MG (50000 UNIT) CAPS capsule Take 1 capsule (50,000 Units total) by mouth once a week. Monday 04/08/22   Aline August, MD      Allergies    Patient has no known allergies.    Review of Systems   Review of Systems  Constitutional:  Negative for fever.  Respiratory:  Positive for cough (chronic).   Cardiovascular:  Positive for chest pain.  Gastrointestinal:  Positive for abdominal  distention and abdominal pain.  Musculoskeletal:  Positive for arthralgias and back pain.  Neurological:  Negative for weakness and numbness.    Physical Exam Updated Vital Signs BP 116/77 (BP Location: Left Arm)   Pulse (!) 108   Temp 99.4 F (37.4 C) (Oral)   Resp 20   Ht 5\' 11"  (1.803 m)   Wt 61.9 kg   SpO2 98%   BMI 19.02 kg/m  Physical Exam Vitals and nursing note reviewed.  Constitutional:      Appearance: He is well-developed.  HENT:     Head: Normocephalic and atraumatic.  Cardiovascular:     Rate and Rhythm: Normal rate and regular rhythm.     Heart sounds: Normal heart sounds.  Pulmonary:      Effort: Pulmonary effort is normal.     Breath sounds: Normal breath sounds.  Abdominal:     Palpations: Abdomen is soft.     Tenderness: There is abdominal tenderness (diffuse).  Musculoskeletal:     Thoracic back: Tenderness present.     Lumbar back: Tenderness present.     Right knee: Swelling (mild) present. No erythema or ecchymosis. Normal range of motion. Tenderness (mild) present.  Skin:    General: Skin is warm and dry.  Neurological:     Mental Status: He is alert.     ED Results / Procedures / Treatments   Labs (all labs ordered are listed, but only abnormal results are displayed) Labs Reviewed  COMPREHENSIVE METABOLIC PANEL - Abnormal; Notable for the following components:      Result Value   Sodium 131 (*)    Chloride 86 (*)    BUN 42 (*)    Creatinine, Ser 6.91 (*)    Calcium 8.2 (*)    Alkaline Phosphatase 273 (*)    Total Bilirubin 1.4 (*)    GFR, Estimated 8 (*)    Anion gap 22 (*)    All other components within normal limits  CBC WITH DIFFERENTIAL/PLATELET - Abnormal; Notable for the following components:   RBC 1.93 (*)    Hemoglobin 7.4 (*)    HCT 20.2 (*)    MCV 104.7 (*)    MCH 38.3 (*)    MCHC 36.6 (*)    RDW 21.0 (*)    nRBC 4.6 (*)    Lymphs Abs 0.5 (*)    Monocytes Absolute 1.3 (*)    Abs Immature Granulocytes 0.08 (*)    All other components within normal limits  RETICULOCYTES - Abnormal; Notable for the following components:   Retic Ct Pct 5.1 (*)    RBC. 1.94 (*)    Immature Retic Fract 19.0 (*)    All other components within normal limits  PROTIME-INR - Abnormal; Notable for the following components:   Prothrombin Time 15.7 (*)    INR 1.3 (*)    All other components within normal limits  TROPONIN I (HIGH SENSITIVITY) - Abnormal; Notable for the following components:   Troponin I (High Sensitivity) 40 (*)    All other components within normal limits  TROPONIN I (HIGH SENSITIVITY) - Abnormal; Notable for the following  components:   Troponin I (High Sensitivity) 42 (*)    All other components within normal limits  LIPASE, BLOOD    EKG EKG Interpretation  Date/Time:  Monday June 04 2022 10:28:03 EST Ventricular Rate:  99 PR Interval:    QRS Duration: 96 QT Interval:  397 QTC Calculation: 510 R Axis:   97 Text Interpretation: Atrial fibrillation  Right axis deviation Nonspecific T abnormalities, lateral leads Prolonged QT interval no significant change since Oct 2023 Confirmed by Sherwood Gambler [phone]) on 06/04/2022 10:31:43 AM  Radiology CT ABDOMEN PELVIS W CONTRAST  Result Date: 06/04/2022 CLINICAL DATA:  Abdominal pain, postop. Sickle cell crisis and back pain with knee pain in generalized pain. Cholecystectomy 05/17/2022 EXAM: CT ABDOMEN AND PELVIS WITH CONTRAST TECHNIQUE: Multidetector CT imaging of the abdomen and pelvis was performed using the standard protocol following bolus administration of intravenous contrast. RADIATION DOSE REDUCTION: This exam was performed according to the departmental dose-optimization program which includes automated exposure control, adjustment of the mA and/or kV according to patient size and/or use of iterative reconstruction technique. CONTRAST:  10mL OMNIPAQUE IOHEXOL 300 MG/ML  SOLN COMPARISON:  04/20/2022 FINDINGS: Lower chest: Small but improved right pleural effusion. Minimal bibasilar atelectasis. Tiny amount left pleural. Stable cardiomegaly. Hepatobiliary: Minimal stable pneumobilia over the central left lobe and evidence of patient's recent cholecystectomy. Biliary tree is otherwise unchanged. Pancreas: Normal. Spleen: Spleen not visualized. Adrenals/Urinary Tract: Adrenal glands are normal and symmetric. Kidneys demonstrate no hydronephrosis or nephrolithiasis. There are several bilateral subcentimeter renal cortical hypodensities too small to characterize but likely cysts and unchanged. No further imaging follow-up needed. Ureters and bladder are normal.  Stomach/Bowel: Stomach and small bowel are within normal. Surgical suture line is present over the region of the cecum appendix is not visualized. Remainder of the colon is unremarkable. Vascular/Lymphatic: Mild calcified plaque over the abdominal aorta which is normal in caliber. No evidence of adenopathy. Reproductive: Normal. Other: Continued interval decrease in size of a focal fluid collection over the left upper quadrant now measuring 1.4 x 3 cm. Worsening fluid over the left mid to upper abdomen. Overall ascites not significantly changed. Musculoskeletal: Diffuse changes throughout the spine likely due to renal osteodystrophy. No focal abnormality. IMPRESSION: 1. No acute findings in the abdomen/pelvis. 2. Continued interval decrease in size of a focal fluid collection over the left upper quadrant now measuring 1.4 x 3 cm. Worsening fluid over the left mid to upper abdomen. Overall ascites not significantly changed. 3. Small but improved right pleural effusion. Minimal bibasilar atelectasis. Tiny amount left pleural effusion. 4. Several bilateral subcentimeter renal cortical hypodensities too small to characterize, but likely cysts and unchanged. No further imaging follow-up needed. 5. Aortic atherosclerosis. Aortic Atherosclerosis (ICD10-I70.0). Electronically Signed   By: Marin Olp M.D.   On: 06/04/2022 12:13   DG Chest 2 View  Result Date: 06/04/2022 CLINICAL DATA:  Sickle cell crisis. Back pain. Knee pain and diffuse generalized pain throughout body. EXAM: CHEST - 2 VIEW COMPARISON:  03/31/2022 FINDINGS: Enlarged, stable in configuration. Lungs are free of focal consolidations and pleural effusions. There are patchy sclerotic changes throughout the ribs, stable in appearance. IMPRESSION: Stable cardiomegaly. Electronically Signed   By: Nolon Nations M.D.   On: 06/04/2022 10:29    Procedures Procedures    Medications Ordered in ED Medications  HYDROmorphone (DILAUDID) injection 0.5 mg  (0.5 mg Subcutaneous Not Given 06/04/22 1022)  HYDROmorphone (DILAUDID) injection 0.5 mg (0.5 mg Subcutaneous Not Given 06/04/22 1022)  HYDROmorphone (DILAUDID) injection 1 mg (1 mg Intravenous Given 06/04/22 1013)  HYDROmorphone (DILAUDID) injection 1 mg (1 mg Intravenous Given 06/04/22 1054)  HYDROmorphone (DILAUDID) injection 1 mg (1 mg Intravenous Given 06/04/22 1157)  iohexol (OMNIPAQUE) 300 MG/ML solution 100 mL (75 mLs Intravenous Contrast Given 06/04/22 1144)  oxyCODONE (Oxy IR/ROXICODONE) immediate release tablet 10 mg (10 mg Oral Given 06/04/22 1341)  ED Course/ Medical Decision Making/ A&P                           Medical Decision Making Amount and/or Complexity of Data Reviewed External Data Reviewed: notes. Labs: ordered.    Details: Troponin is elevated at 40 and 42 but flat.  He had elevated troponins before.  Hemoglobin is chronically anemic.  CKD without significant electrolyte disturbance. Radiology: ordered and independent interpretation performed.    Details: Chest x-ray without CHF.  CT with recurrent fluid but improved from baseline. ECG/medicine tests: ordered and independent interpretation performed.    Details: No STEMI/ischemia.  Risk Prescription drug management.   Patient presents with recurrent sickle cell pain crisis.  He is also having abdominal pain which has been on and off issues since gallbladder removal.  He had drainage to his abdomen before on chart review and with discussion with patient.  Feels like his abdomen is swelling again.  CT shows fluid but I do not know that he needs an emergent paracentesis or drainage via CT guidance like last time.  Doubt SBP at this time.  WBC normal.  No fevers.  From a chest pain perspective, troponins are minimally elevated but flat and so I think ACS is pretty unlikely.  Doubt PE/dissection.  After treatment with IV Dilaudid he feels a lot better.  Will give a home dose of his oxycodone and he has a refill at the  pharmacy now.  He feels well enough for discharge.  Will give return precautions.        Final Clinical Impression(s) / ED Diagnoses Final diagnoses:  Sickle cell pain crisis ([place])  Abdominal pain, unspecified abdominal location    Rx / DC Orders ED Discharge Orders     None         Sherwood Gambler, MD 06/04/22 1445

## 2022-06-04 NOTE — ED Notes (Signed)
Discharge instructions and follow up care reviewed and explained, pt verbalized understanding and had no further questions on d/c. Pt caox4 and ambulatory on d/c.  

## 2022-06-04 NOTE — ED Triage Notes (Signed)
Pt states he is in a sickle cell crisis c/o back pain, knee pain and pain in general diffused throughout the body.

## 2022-06-04 NOTE — Progress Notes (Signed)
Meds ordered this encounter  Medications   allopurinol (ZYLOPRIM) 100 MG tablet    Sig: Take 1 tablet (100 mg total) by mouth daily.    Dispense:  30 tablet    Refill:  1    Order Specific Question:   Supervising Provider    Answer:   [name] [[id number]]   folic acid (FOLVITE) 1 MG tablet    Sig: Take 1 tablet (1 mg total) by mouth daily.    Dispense:  30 tablet    Refill:  1    Order Specific Question:   Supervising Provider    Answer:   [name] [[id number]]   tiZANidine (ZANAFLEX) 4 MG tablet    Sig: TAKE 1/2 TABLET (2MG  TOTAL) BY MOUTH EVERY 8 HOURS AS NEEDED FOR MUSCLE SPASM    Dispense:  30 tablet    Refill:  2    Order Specific Question:   Supervising Provider    AnswerTresa Garter [id number]

## 2022-06-04 NOTE — Telephone Encounter (Signed)
Allupurinol, folic acid, and mucle spasm medication refill request

## 2022-06-04 NOTE — Discharge Instructions (Signed)
If you develop worsening, continued, or recurrent abdominal pain, uncontrolled vomiting, fever, chest or back pain, or any other new/concerning symptoms then return to the ER for evaluation.  

## 2022-06-04 NOTE — ED Notes (Signed)
Patient transported to X-ray 

## 2022-06-06 ENCOUNTER — Telehealth: Payer: Self-pay

## 2022-06-06 NOTE — Telephone Encounter (Signed)
-----   Message from Irving Copas., MD sent at 06/06/2022  4:23 PM EST ----- Yanis Juma, Thank you for trying. Please place documentation into chart. GM ----- Message ----- From: Timothy Lasso, RN Sent: 06/06/2022   3:50 PM EST To: Irving Copas., MD  FYI we have been unable to reach pt as well as IR.  ----- Message ----- From: Roosvelt Maser Sent: 06/06/2022   3:44 PM EST To: Timothy Lasso, RN  We can't reach patient ----- Message ----- From: Cathlean Cower Sent: 06/06/2022   3:39 PM EST To: Roosvelt Maser  12/20 - left 2nd msg on vm ----- Message ----- From: Roosvelt Maser Sent: 05/24/2022  12:25 PM EST To: Cathlean Cower   ----- Message ----- From: Timothy Lasso, RN Sent: 05/24/2022  12:13 PM EST To: April H Pait; Roosvelt Maser  Pt needs paracenteses now and CT scan in 4 weeks-see orders

## 2022-06-07 ENCOUNTER — Inpatient Hospital Stay (HOSPITAL_COMMUNITY)
Admission: EM | Admit: 2022-06-07 | Discharge: 2022-06-10 | DRG: 193 | Disposition: A | Discharge status: Home / Self Care | Payer: PPO | Source: Ambulatory Visit | Attending: Family Medicine | Admitting: Family Medicine

## 2022-06-07 ENCOUNTER — Encounter (HOSPITAL_COMMUNITY): Payer: Self-pay | Admitting: Internal Medicine

## 2022-06-07 ENCOUNTER — Other Ambulatory Visit: Payer: Self-pay

## 2022-06-07 ENCOUNTER — Emergency Department (HOSPITAL_COMMUNITY): Payer: PPO

## 2022-06-07 DIAGNOSIS — I132 Hypertensive heart and chronic kidney disease with heart failure and with stage 5 chronic kidney disease, or end stage renal disease: Secondary | ICD-10-CM | POA: Diagnosis present

## 2022-06-07 DIAGNOSIS — M62838 Other muscle spasm: Secondary | ICD-10-CM | POA: Diagnosis present

## 2022-06-07 DIAGNOSIS — I10 Essential (primary) hypertension: Secondary | ICD-10-CM

## 2022-06-07 DIAGNOSIS — I4821 Permanent atrial fibrillation: Secondary | ICD-10-CM | POA: Diagnosis present

## 2022-06-07 DIAGNOSIS — M109 Gout, unspecified: Secondary | ICD-10-CM | POA: Diagnosis present

## 2022-06-07 DIAGNOSIS — E1122 Type 2 diabetes mellitus with diabetic chronic kidney disease: Secondary | ICD-10-CM | POA: Diagnosis present

## 2022-06-07 DIAGNOSIS — Z87891 Personal history of nicotine dependence: Secondary | ICD-10-CM

## 2022-06-07 DIAGNOSIS — D571 Sickle-cell disease without crisis: Secondary | ICD-10-CM | POA: Diagnosis not present

## 2022-06-07 DIAGNOSIS — Z9049 Acquired absence of other specified parts of digestive tract: Secondary | ICD-10-CM

## 2022-06-07 DIAGNOSIS — N186 End stage renal disease: Secondary | ICD-10-CM | POA: Diagnosis not present

## 2022-06-07 DIAGNOSIS — Z85828 Personal history of other malignant neoplasm of skin: Secondary | ICD-10-CM

## 2022-06-07 DIAGNOSIS — I4819 Other persistent atrial fibrillation: Secondary | ICD-10-CM | POA: Diagnosis not present

## 2022-06-07 DIAGNOSIS — I083 Combined rheumatic disorders of mitral, aortic and tricuspid valves: Secondary | ICD-10-CM | POA: Diagnosis present

## 2022-06-07 DIAGNOSIS — Z8249 Family history of ischemic heart disease and other diseases of the circulatory system: Secondary | ICD-10-CM

## 2022-06-07 DIAGNOSIS — D5701 Hb-SS disease with acute chest syndrome: Secondary | ICD-10-CM

## 2022-06-07 DIAGNOSIS — G894 Chronic pain syndrome: Secondary | ICD-10-CM | POA: Diagnosis present

## 2022-06-07 DIAGNOSIS — J189 Pneumonia, unspecified organism: Secondary | ICD-10-CM | POA: Diagnosis not present

## 2022-06-07 DIAGNOSIS — K810 Acute cholecystitis: Secondary | ICD-10-CM | POA: Diagnosis present

## 2022-06-07 DIAGNOSIS — K559 Vascular disorder of intestine, unspecified: Secondary | ICD-10-CM | POA: Diagnosis present

## 2022-06-07 DIAGNOSIS — N2581 Secondary hyperparathyroidism of renal origin: Secondary | ICD-10-CM | POA: Diagnosis present

## 2022-06-07 DIAGNOSIS — J449 Chronic obstructive pulmonary disease, unspecified: Secondary | ICD-10-CM | POA: Diagnosis present

## 2022-06-07 DIAGNOSIS — Z79899 Other long term (current) drug therapy: Secondary | ICD-10-CM

## 2022-06-07 DIAGNOSIS — H548 Legal blindness, as defined in USA: Secondary | ICD-10-CM | POA: Diagnosis present

## 2022-06-07 DIAGNOSIS — Z811 Family history of alcohol abuse and dependence: Secondary | ICD-10-CM

## 2022-06-07 DIAGNOSIS — R188 Other ascites: Secondary | ICD-10-CM | POA: Diagnosis present

## 2022-06-07 DIAGNOSIS — I272 Pulmonary hypertension, unspecified: Secondary | ICD-10-CM | POA: Diagnosis present

## 2022-06-07 DIAGNOSIS — I428 Other cardiomyopathies: Secondary | ICD-10-CM | POA: Diagnosis present

## 2022-06-07 DIAGNOSIS — Z66 Do not resuscitate: Secondary | ICD-10-CM | POA: Diagnosis present

## 2022-06-07 DIAGNOSIS — Z992 Dependence on renal dialysis: Secondary | ICD-10-CM

## 2022-06-07 DIAGNOSIS — Z1152 Encounter for screening for COVID-19: Secondary | ICD-10-CM

## 2022-06-07 DIAGNOSIS — I5022 Chronic systolic (congestive) heart failure: Secondary | ICD-10-CM | POA: Diagnosis present

## 2022-06-07 DIAGNOSIS — J440 Chronic obstructive pulmonary disease with acute lower respiratory infection: Secondary | ICD-10-CM | POA: Diagnosis present

## 2022-06-07 HISTORY — DX: Early satiety: R68.81

## 2022-06-07 LAB — BASIC METABOLIC PANEL
Anion gap: 18 — ABNORMAL HIGH (ref 5–15)
BUN: 28 mg/dL — ABNORMAL HIGH (ref 8–23)
CO2: 26 mmol/L (ref 22–32)
Calcium: 7.5 mg/dL — ABNORMAL LOW (ref 8.9–10.3)
Chloride: 91 mmol/L — ABNORMAL LOW (ref 98–111)
Creatinine, Ser: 4.71 mg/dL — ABNORMAL HIGH (ref 0.61–1.24)
GFR, Estimated: 13 mL/min — ABNORMAL LOW (ref >60)
Glucose, Bld: 60 mg/dL — ABNORMAL LOW (ref 70–99)
Potassium: 3.8 mmol/L (ref 3.5–5.1)
Sodium: 135 mmol/L (ref 135–145)

## 2022-06-07 LAB — CBC WITH DIFFERENTIAL/PLATELET
Abs Immature Granulocytes: 0.17 10*3/uL — ABNORMAL HIGH (ref 0.00–0.07)
Basophils Absolute: 0 10*3/uL (ref 0.0–0.1)
Basophils Relative: 0 %
Eosinophils Absolute: 0 10*3/uL (ref 0.0–0.5)
Eosinophils Relative: 0 %
HCT: 20 % — ABNORMAL LOW (ref 39.0–52.0)
Hemoglobin: 7.4 g/dL — ABNORMAL LOW (ref 13.0–17.0)
Immature Granulocytes: 2 %
Lymphocytes Relative: 0 %
Lymphs Abs: 0 10*3/uL — ABNORMAL LOW (ref 0.7–4.0)
MCH: 37.4 pg — ABNORMAL HIGH (ref 26.0–34.0)
MCHC: 37 g/dL — ABNORMAL HIGH (ref 30.0–36.0)
MCV: 101 fL — ABNORMAL HIGH (ref 80.0–100.0)
Monocytes Absolute: 1.7 10*3/uL — ABNORMAL HIGH (ref 0.1–1.0)
Monocytes Relative: 16 %
Neutro Abs: 9.1 10*3/uL — ABNORMAL HIGH (ref 1.7–7.7)
Neutrophils Relative %: 82 %
Platelets: 157 10*3/uL (ref 150–400)
RBC: 1.98 MIL/uL — ABNORMAL LOW (ref 4.22–5.81)
RDW: 22.9 % — ABNORMAL HIGH (ref 11.5–15.5)
Smear Review: ADEQUATE
WBC: 11 10*3/uL — ABNORMAL HIGH (ref 4.0–10.5)
nRBC: 8.4 % — ABNORMAL HIGH (ref 0.0–0.2)

## 2022-06-07 LAB — RESP PANEL BY RT-PCR (RSV, FLU A&B, COVID)  RVPGX2
Influenza A by PCR: NEGATIVE
Influenza B by PCR: NEGATIVE
Resp Syncytial Virus by PCR: NEGATIVE
SARS Coronavirus 2 by RT PCR: NEGATIVE

## 2022-06-07 LAB — LACTIC ACID, PLASMA: Lactic Acid, Venous: 1 mmol/L (ref 0.5–1.9)

## 2022-06-07 LAB — PROCALCITONIN: Procalcitonin: 18.07 ng/mL

## 2022-06-07 LAB — HIV ANTIBODY (ROUTINE TESTING W REFLEX): HIV Screen 4th Generation wRfx: NONREACTIVE

## 2022-06-07 MED ORDER — LACTATED RINGERS IV SOLN: INTRAVENOUS | Status: DC

## 2022-06-07 MED ORDER — ACETAMINOPHEN 325 MG PO TABS
650.0000 mg | ORAL_TABLET | Freq: Once | ORAL | Status: AC
  Administered 2022-06-07: 650 mg via ORAL
  Filled 2022-06-07: qty 2

## 2022-06-07 MED ORDER — ACETAMINOPHEN 325 MG PO TABS: 650.0000 mg | ORAL_TABLET | Freq: Four times a day (QID) | ORAL | Status: DC | PRN

## 2022-06-07 MED ORDER — ALLOPURINOL 100 MG PO TABS
100.0000 mg | ORAL_TABLET | Freq: Every day | ORAL | Status: DC
  Administered 2022-06-08 – 2022-06-10 (×3): 100 mg via ORAL
  Filled 2022-06-07 (×3): qty 1

## 2022-06-07 MED ORDER — TORSEMIDE 20 MG PO TABS
20.0000 mg | ORAL_TABLET | Freq: Three times a day (TID) | ORAL | Status: DC
  Administered 2022-06-08: 20 mg via ORAL
  Filled 2022-06-07: qty 1

## 2022-06-07 MED ORDER — HEPARIN SODIUM (PORCINE) 5000 UNIT/ML IJ SOLN
5000.0000 [IU] | Freq: Three times a day (TID) | INTRAMUSCULAR | Status: DC
  Administered 2022-06-07: 5000 [IU] via SUBCUTANEOUS
  Filled 2022-06-07 (×3): qty 1

## 2022-06-07 MED ORDER — OXYCODONE HCL 5 MG PO TABS
10.0000 mg | ORAL_TABLET | Freq: Four times a day (QID) | ORAL | Status: DC | PRN
  Administered 2022-06-07 – 2022-06-09 (×3): 10 mg via ORAL
  Filled 2022-06-07 (×3): qty 2

## 2022-06-07 MED ORDER — ACETAMINOPHEN 650 MG RE SUPP: 650.0000 mg | Freq: Four times a day (QID) | RECTAL | Status: DC | PRN

## 2022-06-07 MED ORDER — POLYETHYLENE GLYCOL 3350 17 G PO PACK: 17.0000 g | PACK | Freq: Every day | ORAL | Status: DC | PRN

## 2022-06-07 MED ORDER — FOLIC ACID 1 MG PO TABS
1.0000 mg | ORAL_TABLET | Freq: Every day | ORAL | Status: DC
  Administered 2022-06-08 – 2022-06-10 (×3): 1 mg via ORAL
  Filled 2022-06-07 (×3): qty 1

## 2022-06-07 MED ORDER — PANTOPRAZOLE SODIUM 40 MG PO TBEC
40.0000 mg | DELAYED_RELEASE_TABLET | Freq: Every day | ORAL | Status: DC
  Administered 2022-06-08 – 2022-06-10 (×3): 40 mg via ORAL
  Filled 2022-06-07 (×3): qty 1

## 2022-06-07 MED ORDER — LACTATED RINGERS IV BOLUS (SEPSIS)
500.0000 mL | Freq: Once | INTRAVENOUS | Status: AC
  Administered 2022-06-07: 500 mL via INTRAVENOUS

## 2022-06-07 MED ORDER — SODIUM CHLORIDE 0.9 % IV SOLN
500.0000 mg | INTRAVENOUS | Status: DC
  Administered 2022-06-07: 500 mg via INTRAVENOUS
  Filled 2022-06-07 (×2): qty 5

## 2022-06-07 MED ORDER — SODIUM CHLORIDE 0.9% FLUSH
3.0000 mL | Freq: Two times a day (BID) | INTRAVENOUS | Status: DC
  Administered 2022-06-07 – 2022-06-10 (×7): 3 mL via INTRAVENOUS

## 2022-06-07 MED ORDER — HYDROXYUREA 500 MG PO CAPS
500.0000 mg | ORAL_CAPSULE | Freq: Every day | ORAL | Status: DC
  Administered 2022-06-07 – 2022-06-10 (×4): 500 mg via ORAL
  Filled 2022-06-07 (×4): qty 1

## 2022-06-07 MED ORDER — SODIUM CHLORIDE 0.9 % IV SOLN
2.0000 g | INTRAVENOUS | Status: DC
  Administered 2022-06-07 – 2022-06-10 (×4): 2 g via INTRAVENOUS
  Filled 2022-06-07 (×4): qty 20

## 2022-06-07 NOTE — ED Provider Notes (Signed)
[facility] EMERGENCY DEPARTMENT Provider Note  CSN: [id number] Arrival date & time: 06/07/22 4315  Chief Complaint(s) Cough  HPI Patrick [name] is a 65 y.o. male with history of sickle cell disease, CHF, end-stage renal disease on dialysis, presenting to the emergency department with fever.  Patient reports he had a fever today at his dialysis center and also had chills.  He reports that he has had a cough for around 3 weeks but last night it became productive and worsening.  He denies any pain in his chest or pain elsewhere.  He reports fatigue.  He was able to complete some of his dialysis session but did not finish and came to the emergency department.  No shortness of breath.   Past Medical History Past Medical History:  Diagnosis Date   A-fib ([place])    Blood dyscrasia    Blood transfusion    "I've had a bunch of them"   CHF (congestive heart failure) ([place])    followed by hf clinic   Dialysis patient ([place]) 04/2019   AV fistula (right arm), TTS   Dysrhythmia    A-Fib per patient   Early satiety    Elevated LFTs 04/01/2022   Elevated troponin 04/01/2022   Empyema of gallbladder 05/27/2021   Gout    Headache(784.0)    "probably weekly" (01/13/2014)   Legally blind    Metabolic acidosis with increased anion gap and accumulation of organic acids 04/01/2022   Mitral regurgitation    Muscle tightness-Right arm  05/27/2014   Pulmonary hypertension ([place])    Shortness of breath dyspnea    Sickle cell disease, type SS (HCC)    Swelling abdomen    Swelling of extremity, left    Swelling of extremity, right    Tricuspid regurgitation    Patient Active Problem List   Diagnosis Date Noted   Pneumonia 06/07/2022   Cystic lesion of abdominal viscera 04/06/2022   Bile leak    Cholelithiasis 11/30/2021   Chronic pain syndrome    Persistent atrial fibrillation ([place]) 09/13/2021   Abdominal fluid collection 08/22/2021   Gastric AVM    Hiatal hernia     History of GI bleed 06/27/2021   Visual impairment 06/27/2021   COPD (chronic obstructive pulmonary disease) ([place]) 06/27/2021   Acute gangrenous cholecystitis s/p open cholcystectomy 05/24/2021 05/27/2021   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 05/24/2021 05/27/2021   Prolonged QT interval 08/02/2020   Chronic anticoagulation    Tobacco dependence 40/01/6760   Chronic systolic CHF (congestive heart failure) ([place]) 09/11/2019   Encounter for therapeutic drug monitoring 12/29/2015   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   ESRD on HD TTS 05/06/2014   Vitamin D deficiency 09/25/2013   CKD (chronic kidney disease), stage IV ([place]) 09/25/2013   Onychomycosis of toenail 08/27/2013   Sickle cell anemia ([place]) 11/21/2012   NSVT (nonsustained ventricular tachycardia) ([place]) 10/21/2011   Gout 10/19/2011   Essential hypertension 06/07/2011   Home Medication(s) Prior to Admission medications   Medication Sig Start Date End Date Taking? Authorizing Provider  acetaminophen (TYLENOL) 500 MG tablet Take 1,500 mg by mouth 3 (three) times daily as needed for mild pain, moderate pain, fever or headache.   Yes [provider]  allopurinol (ZYLOPRIM) 100 MG tablet Take 1 tablet (100 mg total) by mouth daily. 06/04/22  Yes Dorena Dew, FNP  AURYXIA 1 GM 210 MG(Fe) tablet Take 210-420 mg by mouth See admin instructions. 420 mg with every meal,  210 mg with each snack. 02/11/21  Yes [provider]  diphenhydramine-acetaminophen (TYLENOL PM) 25-500 MG TABS tablet Take 2 tablets by mouth at bedtime as needed (sleep/pain).   Yes [provider]  folic acid (FOLVITE) 1 MG tablet Take 1 tablet (1 mg total) by mouth daily. 06/04/22  Yes Dorena Dew, FNP  hydroxyurea (HYDREA) 500 MG capsule Take 1 capsule (500 mg total) by mouth daily. 06/01/22  Yes Jegede, Marlena Clipper, MD  Iron-Vitamins (GERITOL PO) Take 15 mLs by mouth daily.   Yes [provider]   lidocaine-prilocaine (EMLA) cream Apply 1 application. topically Every Tuesday,Thursday,and Saturday with dialysis. 09/08/19  Yes [provider]  Oxycodone HCl 10 MG TABS Take 1 tablet (10 mg total) by mouth every 6 (six) hours as needed. Patient taking differently: Take 10 mg by mouth every 6 (six) hours as needed (pain). 05/31/22  Yes Dorena Dew, FNP  pantoprazole (PROTONIX) 40 MG tablet Take 1 tablet (40 mg total) by mouth daily. 05/17/22  Yes Mansouraty, Telford Nab., MD  tiZANidine (ZANAFLEX) 4 MG tablet TAKE 1/2 TABLET (2MG  TOTAL) BY MOUTH EVERY 8 HOURS AS NEEDED FOR MUSCLE SPASM Patient taking differently: Take 2 mg by mouth 3 (three) times daily as needed for muscle spasms. 06/04/22  Yes Dorena Dew, FNP  torsemide (DEMADEX) 20 MG tablet Take 20 mg by mouth in the morning, at noon, in the evening, and at bedtime. 09/03/21  Yes [provider]  Vitamin D, Ergocalciferol, (DRISDOL) 1.25 MG (50000 UNIT) CAPS capsule Take 1 capsule (50,000 Units total) by mouth once a week. Monday Patient taking differently: Take 50,000 Units by mouth every Monday. 04/08/22  Yes Aline August, MD  benzonatate (TESSALON) 100 MG capsule Take 1 capsule (100 mg total) by mouth 2 (two) times daily as needed for cough. Patient not taking: Reported on 06/07/2022 04/17/22   Renee Rival, FNP                                                                                                                                    Past Surgical History Past Surgical History:  Procedure Laterality Date   A/V FISTULAGRAM Right 07/29/2020   Procedure: A/V FISTULAGRAM - Right Arm;  Surgeon: Angelia Mould, MD;  Location: [facility];  Service: Cardiovascular;  Laterality: Right;   BASCILIC VEIN TRANSPOSITION Right 04/12/2014   Procedure: BASILIC VEIN TRANSPOSITION;  Surgeon: Elam Dutch, MD;  Location: [place];  Service: Vascular;  Laterality: Right;   BILIARY STENT PLACEMENT   04/04/2022   Procedure: BILIARY STENT PLACEMENT;  Surgeon: Ladene Artist, MD;  Location: [facility];  Service: Gastroenterology;;   BIOPSY  08/04/2020   Procedure: BIOPSY;  Surgeon: Ladene Artist, MD;  Location: [place];  Service: Gastroenterology;;   BIOPSY  06/28/2021   Procedure: BIOPSY;  Surgeon: Jerene Bears, MD;  Location: [place];  Service: Gastroenterology;;  BIOPSY  04/02/2022   Procedure: BIOPSY;  Surgeon: Rush Landmark Telford Nab., MD;  Location: [place];  Service: Gastroenterology;;   BOWEL RESECTION N/A 05/24/2021   Procedure: SMALL BOWEL RESECTION;  Surgeon: Rolm Bookbinder, MD;  Location: [place];  Service: General;  Laterality: N/A;   CARDIOVERSION N/A 06/05/2018   Procedure: CARDIOVERSION;  Surgeon: Jolaine Artist, MD;  Location: [facility] ENDOSCOPY;  Service: Cardiovascular;  Laterality: N/A;   CARDIOVERSION N/A 10/06/2019   Procedure: CARDIOVERSION;  Surgeon: Jolaine Artist, MD;  Location: [facility] ENDOSCOPY;  Service: Cardiovascular;  Laterality: N/A;   CHOLECYSTECTOMY N/A 05/24/2021   Procedure: OPEN CHOLECYSTECTOMY;  Surgeon: Rolm Bookbinder, MD;  Location: [place];  Service: General;  Laterality: N/A;   COLONOSCOPY WITH PROPOFOL N/A 09/24/2017   Procedure: COLONOSCOPY WITH PROPOFOL;  Surgeon: Jackquline Denmark, MD;  Location: [place];  Service: Endoscopy;  Laterality: N/A;   ENDOSCOPIC RETROGRADE CHOLANGIOPANCREATOGRAPHY (ERCP) WITH PROPOFOL N/A 05/17/2022   Procedure: ENDOSCOPIC RETROGRADE CHOLANGIOPANCREATOGRAPHY (ERCP) WITH PROPOFOL;  Surgeon: Rush Landmark Telford Nab., MD;  Location: [facility] ENDOSCOPY;  Service: Gastroenterology;  Laterality: N/A;   ERCP N/A 04/04/2022   Procedure: ENDOSCOPIC RETROGRADE CHOLANGIOPANCREATOGRAPHY (ERCP);  Surgeon: Ladene Artist, MD;  Location: [place];  Service: Gastroenterology;  Laterality: N/A;   ESOPHAGOGASTRODUODENOSCOPY N/A 09/19/2017   Procedure: ESOPHAGOGASTRODUODENOSCOPY (EGD);  Surgeon: Jackquline Denmark,  MD;  Location: [facility] ENDOSCOPY;  Service: Endoscopy;  Laterality: N/A;   ESOPHAGOGASTRODUODENOSCOPY N/A 05/29/2021   Procedure: ESOPHAGOGASTRODUODENOSCOPY (EGD);  Surgeon: Milus Banister, MD;  Location: [facility] ENDOSCOPY;  Service: Gastroenterology;  Laterality: N/A;   ESOPHAGOGASTRODUODENOSCOPY (EGD) WITH PROPOFOL N/A 08/04/2020   Procedure: ESOPHAGOGASTRODUODENOSCOPY (EGD) WITH PROPOFOL;  Surgeon: Ladene Artist, MD;  Location: [place];  Service: Gastroenterology;  Laterality: N/A;   ESOPHAGOGASTRODUODENOSCOPY (EGD) WITH PROPOFOL N/A 06/28/2021   Procedure: ESOPHAGOGASTRODUODENOSCOPY (EGD) WITH PROPOFOL;  Surgeon: Jerene Bears, MD;  Location: [facility] ENDOSCOPY;  Service: Gastroenterology;  Laterality: N/A;   ESOPHAGOGASTRODUODENOSCOPY (EGD) WITH PROPOFOL N/A 08/10/2021   Procedure: ESOPHAGOGASTRODUODENOSCOPY (EGD) WITH PROPOFOL;  Surgeon: Jackquline Denmark, MD;  Location: [place];  Service: Gastroenterology;  Laterality: N/A;   ESOPHAGOGASTRODUODENOSCOPY (EGD) WITH PROPOFOL N/A 08/02/2021   Procedure: ESOPHAGOGASTRODUODENOSCOPY (EGD) WITH PROPOFOL;  Surgeon: Lavena Bullion, DO;  Location: [place];  Service: Gastroenterology;  Laterality: N/A;   ESOPHAGOGASTRODUODENOSCOPY (EGD) WITH PROPOFOL N/A 04/02/2022   Procedure: ESOPHAGOGASTRODUODENOSCOPY (EGD) WITH PROPOFOL;  Surgeon: Rush Landmark Telford Nab., MD;  Location: [place];  Service: Gastroenterology;  Laterality: N/A;   ESOPHAGOGASTRODUODENOSCOPY (EGD) WITH PROPOFOL N/A 05/17/2022   Procedure: ESOPHAGOGASTRODUODENOSCOPY (EGD) WITH PROPOFOL;  Surgeon: Rush Landmark Telford Nab., MD;  Location: [facility] ENDOSCOPY;  Service: Gastroenterology;  Laterality: N/A;   EUS N/A 05/17/2022   Procedure: UPPER ENDOSCOPIC ULTRASOUND (EUS) RADIAL;  Surgeon: Irving Copas., MD;  Location: [facility] ENDOSCOPY;  Service: Gastroenterology;  Laterality: N/A;   FINE NEEDLE ASPIRATION  04/02/2022   Procedure: FINE NEEDLE ASPIRATION (FNA) LINEAR;  Surgeon:  Irving Copas., MD;  Location: [place];  Service: Gastroenterology;;   FINE NEEDLE ASPIRATION N/A 05/17/2022   Procedure: FINE NEEDLE ASPIRATION (FNA) LINEAR;  Surgeon: Irving Copas., MD;  Location: [facility] ENDOS[facility];  Service: Gastroenterology;  Laterality: N/A;   HEMOSTASIS CLIP PLACEMENT  05/29/2021   Procedure: HEMOSTASIS CLIP PLACEMENT;  Surgeon: Milus Banister, MD;  Location: [facility] ENDOSCOPY;  Service: Gastroenterology;;   HOT HEMOSTASIS N/A 05/29/2021   Procedure: HOT HEMOSTASIS (ARGON PLASMA COAGULATION/BICAP);  Surgeon: Milus Banister, MD;  Location: [facility] ENDOSCOPY;  Service: Gastroenterology;  Laterality: N/A;   HOT HEMOSTASIS N/A  06/28/2021   Procedure: HOT HEMOSTASIS (ARGON PLASMA COAGULATION/BICAP);  Surgeon: Jerene Bears, MD;  Location: [facility] ENDOSCOPY;  Service: Gastroenterology;  Laterality: N/A;   HOT HEMOSTASIS N/A 08/10/2021   Procedure: HOT HEMOSTASIS (ARGON PLASMA COAGULATION/BICAP);  Surgeon: Jackquline Denmark, MD;  Location: [facility] ENDOSCOPY;  Service: Gastroenterology;  Laterality: N/A;   HOT HEMOSTASIS N/A 08/02/2021   Procedure: HOT HEMOSTASIS (ARGON PLASMA COAGULATION/BICAP);  Surgeon: Lavena Bullion, DO;  Location: [facility] ENDOSCOPY;  Service: Gastroenterology;  Laterality: N/A;   IR PERC TUN PERIT CATH WO PORT S&I /IMAG  05/23/2021   IR REMOVAL TUN CV CATH W/O FL  06/10/2021   IR US GUIDE VASC ACCESS RIGHT  05/23/2021   LAPAROSCOPY N/A 05/24/2021   Procedure: LAPAROSCOPY DIAGNOSTIC;  Surgeon: Rolm Bookbinder, MD;  Location: [place];  Service: General;  Laterality: N/A;   LAPAROTOMY N/A 05/24/2021   Procedure: EXPLORATORY LAPAROTOMY;  Surgeon: Rolm Bookbinder, MD;  Location: [place];  Service: General;  Laterality: N/A;   LEFT AND RIGHT HEART CATHETERIZATION WITH CORONARY ANGIOGRAM N/A 06/11/2011   Procedure: LEFT AND RIGHT HEART CATHETERIZATION WITH CORONARY ANGIOGRAM;  Surgeon: Larey Dresser, MD;  Location: [facility] CATH LAB;  Service: Cardiovascular;   Laterality: N/A;   PERIPHERAL VASCULAR BALLOON ANGIOPLASTY Right 07/29/2020   Procedure: PERIPHERAL VASCULAR BALLOON ANGIOPLASTY;  Surgeon: Angelia Mould, MD;  Location: [place] [facility];  Service: Cardiovascular;  Laterality: Right;  arm fistula   REMOVAL OF STONES  05/17/2022   Procedure: REMOVAL OF STONES;  Surgeon: Rush Landmark Telford Nab., MD;  Location: [facility];  Service: Gastroenterology;;   REVISON OF ARTERIOVENOUS FISTULA Right 08/08/2020   Procedure: ANEURYSM EXCISION OF RIGHT UPPER EXTREMITY ARTERIOVENOUS FISTULA;  Surgeon: Angelia Mould, MD;  Location: [place];  Service: Vascular;  Laterality: Right;   REVISON OF ARTERIOVENOUS FISTULA Right 03/21/2022   Procedure: EXCISION OF ULCERATED SKIN OF RIGHT ARTERIOVENOUS FISTULA REVISION;  Surgeon: Marty Heck, MD;  Location: [facility] OR;  Service: Vascular;  Laterality: Right;   SCLEROTHERAPY  05/29/2021   Procedure: Clide Deutscher;  Surgeon: Milus Banister, MD;  Location: [facility] ENDOSCOPY;  Service: Gastroenterology;;   Joan Mayans  04/04/2022   Procedure: SPHINCTEROTOMY;  Surgeon: Ladene Artist, MD;  Location: [facility] ENDOSCOPY;  Service: Gastroenterology;;   Lavell Islam REMOVAL  05/29/2021   Procedure: STENT REMOVAL;  Surgeon: Milus Banister, MD;  Location: [place];  Service: Gastroenterology;;   Lavell Islam REMOVAL  05/17/2022   Procedure: STENT REMOVAL;  Surgeon: Irving Copas., MD;  Location: [facility] ENDOSCOPY;  Service: Gastroenterology;;   TEE WITHOUT CARDIOVERSION N/A 12/27/2015   Procedure: TRANSESOPHAGEAL ECHOCARDIOGRAM (TEE);  Surgeon: Sueanne Margarita, MD;  Location: [place];  Service: Cardiovascular;  Laterality: N/A;   UPPER ESOPHAGEAL ENDOSCOPIC ULTRASOUND (EUS) N/A 04/02/2022   Procedure: UPPER ESOPHAGEAL ENDOSCOPIC ULTRASOUND (EUS);  Surgeon: Irving Copas., MD;  Location: [place];  Service: Gastroenterology;  Laterality: N/A;   Family History Family History  Problem Relation Age of  Onset   Alcohol abuse Mother    Liver disease Mother    Cirrhosis Mother    Heart attack Mother     Social History Social History   Tobacco Use   Smoking status: Former    Packs/day: 0.50    Years: 41.00    Total pack years: 20.50    Types: Cigarettes    Quit date: 12/08/2015    Years since quitting: 6.5   Smokeless tobacco: Never  Vaping Use   Vaping Use: Never used  Substance Use  Topics   Alcohol use: Not Currently    Alcohol/week: 3.0 standard drinks of alcohol    Types: 1 Glasses of wine, 2 Cans of beer per week    Comment: none since starting dialysis 06/2021   Drug use: No   Allergies Patient has no known allergies.  Review of Systems Review of Systems  All other systems reviewed and are negative.   Physical Exam Vital Signs  I have reviewed the triage vital signs BP 108/69 (BP Location: Left Arm)   Pulse (!) 103   Temp 99.6 F (37.6 C) (Oral)   Resp 16   Ht 5\' 11"  (1.803 m)   Wt 59 kg   SpO2 94%   BMI 18.13 kg/m  Physical Exam Vitals and nursing note reviewed.  Constitutional:      General: He is not in acute distress.    Appearance: Normal appearance.  HENT:     Mouth/Throat:     Mouth: Mucous membranes are moist.  Eyes:     Conjunctiva/sclera: Conjunctivae normal.  Cardiovascular:     Rate and Rhythm: Normal rate and regular rhythm.  Pulmonary:     Effort: Pulmonary effort is normal. No respiratory distress.     Breath sounds: Rhonchi and rales present.  Abdominal:     General: Abdomen is flat.     Palpations: Abdomen is soft.     Tenderness: There is no abdominal tenderness.  Musculoskeletal:     Right lower leg: No edema.     Left lower leg: No edema.  Skin:    General: Skin is warm and dry.     Capillary Refill: Capillary refill takes less than 2 seconds.  Neurological:     Mental Status: He is alert and oriented to person, place, and time. Mental status is at baseline.  Psychiatric:        Mood and Affect: Mood normal.         Behavior: Behavior normal.     ED Results and Treatments Labs (all labs ordered are listed, but only abnormal results are displayed) Labs Reviewed  CBC WITH DIFFERENTIAL/PLATELET - Abnormal; Notable for the following components:      Result Value   WBC 11.0 (*)    RBC 1.98 (*)    Hemoglobin 7.4 (*)    HCT 20.0 (*)    MCV 101.0 (*)    MCH 37.4 (*)    MCHC 37.0 (*)    RDW 22.9 (*)    nRBC 8.4 (*)    Neutro Abs 9.1 (*)    Lymphs Abs 0.0 (*)    Monocytes Absolute 1.7 (*)    Abs Immature Granulocytes 0.17 (*)    All other components within normal limits  BASIC METABOLIC PANEL - Abnormal; Notable for the following components:   Chloride 91 (*)    Glucose, Bld 60 (*)    BUN 28 (*)    Creatinine, Ser 4.71 (*)    Calcium 7.5 (*)    GFR, Estimated 13 (*)    Anion gap 18 (*)    All other components within normal limits  RESP PANEL BY RT-PCR (RSV, FLU A&B, COVID)  RVPGX2  CULTURE, BLOOD (ROUTINE X 2)  CULTURE, BLOOD (ROUTINE X 2)  RESPIRATORY PANEL BY PCR  LACTIC ACID, PLASMA  PROCALCITONIN  LACTIC ACID, PLASMA  HIV ANTIBODY (ROUTINE TESTING W REFLEX)  LEGIONELLA PNEUMOPHILA SEROGP 1 UR AG  STREP PNEUMONIAE URINARY ANTIGEN  Radiology DG Chest 2 View  Result Date: 06/07/2022 CLINICAL DATA:  Cough EXAM: CHEST - 2 VIEW COMPARISON:  06/04/22 CXR FINDINGS: No pleural effusion. No pneumothorax. Unchanged cardiac and mediastinal contours. Cardiac contours are likely at the upper limits of normal. Aortic atherosclerotic calcifications. The prominent bilateral interstitial opacities, which are nonspecific, and could represent pulmonary venous congestion or atypical infection. Visualized upper abdomen is unremarkable. Vertebral body heights are maintained. IMPRESSION: Prominent bilateral interstitial opacities, which are nonspecific, and could represent pulmonary venous  congestion or atypical infection. Electronically Signed   By: Marin Roberts M.D.   On: 06/07/2022 09:33    Pertinent labs & imaging results that were available during my care of the patient were reviewed by me and considered in my medical decision making (see MDM for details).  Medications Ordered in ED Medications  cefTRIAXone (ROCEPHIN) 2 g in sodium chloride 0.9 % 100 mL IVPB (0 g Intravenous Stopped 06/07/22 1355)  azithromycin (ZITHROMAX) 500 mg in sodium chloride 0.9 % 250 mL IVPB (0 mg Intravenous Stopped 06/07/22 1500)  allopurinol (ZYLOPRIM) tablet 100 mg (has no administration in time range)  hydroxyurea (HYDREA) capsule 500 mg (has no administration in time range)  pantoprazole (PROTONIX) EC tablet 40 mg (has no administration in time range)  folic acid (FOLVITE) tablet 1 mg (has no administration in time range)  heparin injection 5,000 Units (has no administration in time range)  sodium chloride flush (NS) 0.9 % injection 3 mL (3 mLs Intravenous Given 06/07/22 1309)  acetaminophen (TYLENOL) tablet 650 mg (has no administration in time range)    Or  acetaminophen (TYLENOL) suppository 650 mg (has no administration in time range)  polyethylene glycol (MIRALAX / GLYCOLAX) packet 17 g (has no administration in time range)  oxyCODONE (Oxy IR/ROXICODONE) immediate release tablet 10 mg (10 mg Oral Given 06/07/22 1516)  torsemide (DEMADEX) tablet 20 mg (has no administration in time range)  acetaminophen (TYLENOL) tablet 650 mg (650 mg Oral Given 06/07/22 0855)  lactated ringers bolus 500 mL (0 mLs Intravenous Stopped 06/07/22 1500)                                                                                                                                     Procedures .Critical Care  Performed by: Cristie Hem, MD Authorized by: Cristie Hem, MD   Critical care provider statement:    Critical care time (minutes):  30   Critical care was necessary to treat or  prevent imminent or life-threatening deterioration of the following conditions:  Sepsis and respiratory failure   Critical care was time spent personally by me on the following activities:  Development of treatment plan with patient or surrogate, discussions with consultants, evaluation of patient's response to treatment, examination of patient, ordering and review of laboratory studies, ordering and review of radiographic studies, ordering and performing treatments and interventions, pulse oximetry, re-evaluation of patient's condition and review  of old charts   (including critical care time)  Medical Decision Making / ED Course   MDM:  66 year old male presenting to the emergency department with cough.  Patient well-appearing, vital signs notable for mild tachycardia and fever.  Examination with mild bilateral crackles or rhonchi.  High concern for pneumonia as a cause of the patient's symptoms.  Could also be due to volume overload although patient without peripheral edema, or viral illness although flu/COVID swab negative.  Given sickle cell disease and end-stage renal disease, patient extremely high risk for progression of his symptoms.  He denies chest pain currently to suggest acute chest syndrome but he does have borderline hypoxia and infiltrates on his chest x-ray.  His labs do show schistocytes and sickle cells.  Concern for sepsis, 30 cc/kg fluid bolus deferred given he is on dialysis and has pulmonary hypertension and is not hypotensive.  Blood cultures obtained.  No abdominal pain, rashes, headaches to suggest alternative infectious source.  Patient will likely need admission.  Clinical Course as of 06/07/22 1535  Thu Jun 07, 2022  1250 Discussed with the hospitalist who will admit the patient.  [WS]    Clinical Course User Index [WS] Cristie Hem, MD     Additional history obtained: -Additional history obtained from ems -External records from outside source obtained  and reviewed including: Chart review including previous notes, labs, imaging, consultation notes including ED visit for sickle cell crisis 06/04/22   Lab Tests: -I ordered, reviewed, and interpreted labs.   The pertinent results include:   Labs Reviewed  CBC WITH DIFFERENTIAL/PLATELET - Abnormal; Notable for the following components:      Result Value   WBC 11.0 (*)    RBC 1.98 (*)    Hemoglobin 7.4 (*)    HCT 20.0 (*)    MCV 101.0 (*)    MCH 37.4 (*)    MCHC 37.0 (*)    RDW 22.9 (*)    nRBC 8.4 (*)    Neutro Abs 9.1 (*)    Lymphs Abs 0.0 (*)    Monocytes Absolute 1.7 (*)    Abs Immature Granulocytes 0.17 (*)    All other components within normal limits  BASIC METABOLIC PANEL - Abnormal; Notable for the following components:   Chloride 91 (*)    Glucose, Bld 60 (*)    BUN 28 (*)    Creatinine, Ser 4.71 (*)    Calcium 7.5 (*)    GFR, Estimated 13 (*)    Anion gap 18 (*)    All other components within normal limits  RESP PANEL BY RT-PCR (RSV, FLU A&B, COVID)  RVPGX2  CULTURE, BLOOD (ROUTINE X 2)  CULTURE, BLOOD (ROUTINE X 2)  RESPIRATORY PANEL BY PCR  LACTIC ACID, PLASMA  PROCALCITONIN  LACTIC ACID, PLASMA  HIV ANTIBODY (ROUTINE TESTING W REFLEX)  LEGIONELLA PNEUMOPHILA SEROGP 1 UR AG  STREP PNEUMONIAE URINARY ANTIGEN    Notable for mild leukocytosis, chronic anemia, schistocytes on CBC with sickle cells, baseline creatinine   Imaging Studies ordered: I ordered imaging studies including CXR On my interpretation imaging demonstrates bilateral infiltrates I independently visualized and interpreted imaging. I agree with the radiologist interpretation   Medicines ordered and prescription drug management: Meds ordered this encounter  Medications   acetaminophen (TYLENOL) tablet 650 mg   DISCONTD: lactated ringers infusion   lactated ringers bolus 500 mL    Order Specific Question:   Reason 30 mL/kg dose is not being ordered  Answer:   Other (see comment below)     Order Specific Question:   Comment    Answer:   ESRD   cefTRIAXone (ROCEPHIN) 2 g in sodium chloride 0.9 % 100 mL IVPB    Order Specific Question:   Antibiotic Indication:    Answer:   CAP   azithromycin (ZITHROMAX) 500 mg in sodium chloride 0.9 % 250 mL IVPB    Order Specific Question:   Antibiotic Indication:    Answer:   CAP   allopurinol (ZYLOPRIM) tablet 100 mg   hydroxyurea (HYDREA) capsule 500 mg   pantoprazole (PROTONIX) EC tablet 40 mg   folic acid (FOLVITE) tablet 1 mg   heparin injection 5,000 Units   sodium chloride flush (NS) 0.9 % injection 3 mL   OR Linked Order Group    acetaminophen (TYLENOL) tablet 650 mg    acetaminophen (TYLENOL) suppository 650 mg   polyethylene glycol (MIRALAX / GLYCOLAX) packet 17 g   oxyCODONE (Oxy IR/ROXICODONE) immediate release tablet 10 mg   torsemide (DEMADEX) tablet 20 mg    -I have reviewed the patients home medicines and have made adjustments as needed   Consultations Obtained: I requested consultation with the hospitalist,  and discussed lab and imaging findings as well as pertinent plan - they recommend: admission   Cardiac Monitoring: The patient was maintained on a cardiac monitor.  I personally viewed and interpreted the cardiac monitored which showed an underlying rhythm of: sinus tachycardia    Reevaluation: After the interventions noted above, I reevaluated the patient and found that they have improved  Co morbidities that complicate the patient evaluation  Past Medical History:  Diagnosis Date   A-fib ([place])    Blood dyscrasia    Blood transfusion    "I've had a bunch of them"   CHF (congestive heart failure) ([place])    followed by hf clinic   Dialysis patient ([place]) 04/2019   AV fistula (right arm), TTS   Dysrhythmia    A-Fib per patient   Early satiety    Elevated LFTs 04/01/2022   Elevated troponin 04/01/2022   Empyema of gallbladder 05/27/2021   Gout    Headache(784.0)    "probably weekly"  (01/13/2014)   Legally blind    Metabolic acidosis with increased anion gap and accumulation of organic acids 04/01/2022   Mitral regurgitation    Muscle tightness-Right arm  05/27/2014   Pulmonary hypertension ([place])    Shortness of breath dyspnea    Sickle cell disease, type SS (HCC)    Swelling abdomen    Swelling of extremity, left    Swelling of extremity, right    Tricuspid regurgitation       Dispostion: Disposition decision including need for hospitalization was considered, and patient admitted to the hospital.    Final Clinical Impression(s) / ED Diagnoses Final diagnoses:  Multifocal pneumonia  Acute chest syndrome [facility])     This chart was dictated using voice recognition software.  Despite best efforts to proofread,  errors can occur which can change the documentation meaning.    Cristie Hem, MD 06/07/22 1535

## 2022-06-07 NOTE — Sepsis Progress Note (Signed)
Elink following code sepsis °

## 2022-06-07 NOTE — ED Notes (Signed)
Hooked patient back up to the monitor patient is resting with call bell in reach 

## 2022-06-07 NOTE — H&P (Signed)
History and Physical   Patrick [name] HFW:[id number] DOB: March 03, 1956 DOA: 06/07/2022  PCP: [name], MD   Patient coming from: Dialysis center  Chief Complaint: Cough, Chills  HPI: Patrick [name] is a 66 y.o. male with medical history significant of 6 anemia, GI bleed, SBO with ischemic bowel status post small bowel resection, gallbladder empyema status post cholecystostomy, status post biliary stent, gout, hypertension, ESRD on HD TTS, A-fib, prolonged QT, COPD, CHF, chronic pain presenting with cough, chills.  Patient was at dialysis today and began to experience chills with shivering.  He has had a cough for the past several weeks as well. Also reporting 1-2 days of nausea and decreased PO intake. He was able to complete 1-1/2 hours of his dialysis session before concerning symptoms prompted transport to the ED.  He denies fevers, chest pain, shortness of breath, abdominal pain, constipation, diarrhea.  ED Course: Vital signs in the ED significant for fever to 100.4, tachycardia in the 100s to 110s, blood pressure in the 100s to 110s which is near his baseline.  Lab workup included BMP with chloride of 91, BUN of 28, creatinine near baseline at 4.71, initial glucose check low at 60, normal bicarb a gap of 18, calcium 7.5 which is near baseline.  CBC with leukocytosis to 11, hemoglobin stable at 7.4.  Lactic acid pending.  Respiratory panel for flu and COVID-negative.  Blood cultures pending.  Chest x-ray with prominent bilateral opacities suspicious for congestions versus atypical pneumonia.  Patient received ceftriaxone and azithromycin in the ED as well as Tylenol and a 500 cc bolus.  Review of Systems: As per HPI otherwise all other systems reviewed and are negative.  Past Medical History:  Diagnosis Date   A-fib ([place])    Blood dyscrasia    Blood transfusion    "I've had a bunch of them"   CHF (congestive heart failure) ([place])    followed by hf clinic   Dialysis  patient ([place]) 04/2019   AV fistula (right arm), TTS   Dysrhythmia    A-Fib per patient   Early satiety    Elevated LFTs 04/01/2022   Elevated troponin 04/01/2022   Empyema of gallbladder 05/27/2021   Gout    Headache(784.0)    "probably weekly" (01/13/2014)   Legally blind    Metabolic acidosis with increased anion gap and accumulation of organic acids 04/01/2022   Mitral regurgitation    Muscle tightness-Right arm  05/27/2014   Pulmonary hypertension (HCC)    Shortness of breath dyspnea    Sickle cell disease, type SS (HCC)    Swelling abdomen    Swelling of extremity, left    Swelling of extremity, right    Tricuspid regurgitation     Past Surgical History:  Procedure Laterality Date   A/V FISTULAGRAM Right 07/29/2020   Procedure: A/V FISTULAGRAM - Right Arm;  Surgeon: Angelia Mould, MD;  Location: [place] CV LAB;  Service: Cardiovascular;  Laterality: Right;   BASCILIC VEIN TRANSPOSITION Right 04/12/2014   Procedure: BASILIC VEIN TRANSPOSITION;  Surgeon: Elam Dutch, MD;  Location: [facility] OR;  Service: Vascular;  Laterality: Right;   BILIARY STENT PLACEMENT  04/04/2022   Procedure: BILIARY STENT PLACEMENT;  Surgeon: Ladene Artist, MD;  Location: [facility] ENDOSCOPY;  Service: Gastroenterology;;   BIOPSY  08/04/2020   Procedure: BIOPSY;  Surgeon: Ladene Artist, MD;  Location: [facility] ENDOSCOPY;  Service: Gastroenterology;;   BIOPSY  06/28/2021   Procedure: BIOPSY;  Surgeon: Zenovia Jarred  M, MD;  Location: [place];  Service: Gastroenterology;;   BIOPSY  04/02/2022   Procedure: BIOPSY;  Surgeon: Irving Copas., MD;  Location: [place];  Service: Gastroenterology;;   BOWEL RESECTION N/A 05/24/2021   Procedure: SMALL BOWEL RESECTION;  Surgeon: Rolm Bookbinder, MD;  Location: [place];  Service: General;  Laterality: N/A;   CARDIOVERSION N/A 06/05/2018   Procedure: CARDIOVERSION;  Surgeon: Jolaine Artist, MD;  Location: [facility] ENDOSCOPY;  Service:  Cardiovascular;  Laterality: N/A;   CARDIOVERSION N/A 10/06/2019   Procedure: CARDIOVERSION;  Surgeon: Jolaine Artist, MD;  Location: [facility] ENDOSCOPY;  Service: Cardiovascular;  Laterality: N/A;   CHOLECYSTECTOMY N/A 05/24/2021   Procedure: OPEN CHOLECYSTECTOMY;  Surgeon: Rolm Bookbinder, MD;  Location: [place];  Service: General;  Laterality: N/A;   COLONOSCOPY WITH PROPOFOL N/A 09/24/2017   Procedure: COLONOSCOPY WITH PROPOFOL;  Surgeon: Jackquline Denmark, MD;  Location: [place];  Service: Endoscopy;  Laterality: N/A;   ENDOSCOPIC RETROGRADE CHOLANGIOPANCREATOGRAPHY (ERCP) WITH PROPOFOL N/A 05/17/2022   Procedure: ENDOSCOPIC RETROGRADE CHOLANGIOPANCREATOGRAPHY (ERCP) WITH PROPOFOL;  Surgeon: Rush Landmark Telford Nab., MD;  Location: [facility] ENDOSCOPY;  Service: Gastroenterology;  Laterality: N/A;   ERCP N/A 04/04/2022   Procedure: ENDOSCOPIC RETROGRADE CHOLANGIOPANCREATOGRAPHY (ERCP);  Surgeon: Ladene Artist, MD;  Location: [place];  Service: Gastroenterology;  Laterality: N/A;   ESOPHAGOGASTRODUODENOSCOPY N/A 09/19/2017   Procedure: ESOPHAGOGASTRODUODENOSCOPY (EGD);  Surgeon: Jackquline Denmark, MD;  Location: [facility] ENDOSCOPY;  Service: Endoscopy;  Laterality: N/A;   ESOPHAGOGASTRODUODENOSCOPY N/A 05/29/2021   Procedure: ESOPHAGOGASTRODUODENOSCOPY (EGD);  Surgeon: Milus Banister, MD;  Location: [facility] ENDOSCOPY;  Service: Gastroenterology;  Laterality: N/A;   ESOPHAGOGASTRODUODENOSCOPY (EGD) WITH PROPOFOL N/A 08/04/2020   Procedure: ESOPHAGOGASTRODUODENOSCOPY (EGD) WITH PROPOFOL;  Surgeon: Ladene Artist, MD;  Location: [place];  Service: Gastroenterology;  Laterality: N/A;   ESOPHAGOGASTRODUODENOSCOPY (EGD) WITH PROPOFOL N/A 06/28/2021   Procedure: ESOPHAGOGASTRODUODENOSCOPY (EGD) WITH PROPOFOL;  Surgeon: Jerene Bears, MD;  Location: [facility] ENDOSCOPY;  Service: Gastroenterology;  Laterality: N/A;   ESOPHAGOGASTRODUODENOSCOPY (EGD) WITH PROPOFOL N/A 08/10/2021   Procedure:  ESOPHAGOGASTRODUODENOSCOPY (EGD) WITH PROPOFOL;  Surgeon: Jackquline Denmark, MD;  Location: [place];  Service: Gastroenterology;  Laterality: N/A;   ESOPHAGOGASTRODUODENOSCOPY (EGD) WITH PROPOFOL N/A 08/02/2021   Procedure: ESOPHAGOGASTRODUODENOSCOPY (EGD) WITH PROPOFOL;  Surgeon: Lavena Bullion, DO;  Location: [place];  Service: Gastroenterology;  Laterality: N/A;   ESOPHAGOGASTRODUODENOSCOPY (EGD) WITH PROPOFOL N/A 04/02/2022   Procedure: ESOPHAGOGASTRODUODENOSCOPY (EGD) WITH PROPOFOL;  Surgeon: Rush Landmark Telford Nab., MD;  Location: [place];  Service: Gastroenterology;  Laterality: N/A;   ESOPHAGOGASTRODUODENOSCOPY (EGD) WITH PROPOFOL N/A 05/17/2022   Procedure: ESOPHAGOGASTRODUODENOSCOPY (EGD) WITH PROPOFOL;  Surgeon: Rush Landmark Telford Nab., MD;  Location: [facility] ENDOSCOPY;  Service: Gastroenterology;  Laterality: N/A;   EUS N/A 05/17/2022   Procedure: UPPER ENDOSCOPIC ULTRASOUND (EUS) RADIAL;  Surgeon: Irving Copas., MD;  Location: [facility] ENDOSCOPY;  Service: Gastroenterology;  Laterality: N/A;   FINE NEEDLE ASPIRATION  04/02/2022   Procedure: FINE NEEDLE ASPIRATION (FNA) LINEAR;  Surgeon: Irving Copas., MD;  Location: [place];  Service: Gastroenterology;;   FINE NEEDLE ASPIRATION N/A 05/17/2022   Procedure: FINE NEEDLE ASPIRATION (FNA) LINEAR;  Surgeon: Irving Copas., MD;  Location: [facility] ENDOS[facility];  Service: Gastroenterology;  Laterality: N/A;   HEMOSTASIS CLIP PLACEMENT  05/29/2021   Procedure: HEMOSTASIS CLIP PLACEMENT;  Surgeon: Milus Banister, MD;  Location: [facility] ENDOSCOPY;  Service: Gastroenterology;;   HOT HEMOSTASIS N/A 05/29/2021   Procedure: HOT HEMOSTASIS (ARGON PLASMA COAGULATION/BICAP);  Surgeon: Milus Banister, MD;  Location: [facility] ENDOSCOPY;  Service: Gastroenterology;  Laterality: N/A;   HOT HEMOSTASIS N/A 06/28/2021   Procedure: HOT HEMOSTASIS (ARGON PLASMA COAGULATION/BICAP);  Surgeon: Jerene Bears, MD;  Location: [facility] ENDOSCOPY;   Service: Gastroenterology;  Laterality: N/A;   HOT HEMOSTASIS N/A 08/10/2021   Procedure: HOT HEMOSTASIS (ARGON PLASMA COAGULATION/BICAP);  Surgeon: Jackquline Denmark, MD;  Location: [facility] ENDOSCOPY;  Service: Gastroenterology;  Laterality: N/A;   HOT HEMOSTASIS N/A 08/02/2021   Procedure: HOT HEMOSTASIS (ARGON PLASMA COAGULATION/BICAP);  Surgeon: Lavena Bullion, DO;  Location: [facility] ENDOSCOPY;  Service: Gastroenterology;  Laterality: N/A;   IR PERC TUN PERIT CATH WO PORT S&I /IMAG  05/23/2021   IR REMOVAL TUN CV CATH W/O FL  06/10/2021   IR US GUIDE VASC ACCESS RIGHT  05/23/2021   LAPAROSCOPY N/A 05/24/2021   Procedure: LAPAROSCOPY DIAGNOSTIC;  Surgeon: Rolm Bookbinder, MD;  Location: [place];  Service: General;  Laterality: N/A;   LAPAROTOMY N/A 05/24/2021   Procedure: EXPLORATORY LAPAROTOMY;  Surgeon: Rolm Bookbinder, MD;  Location: [place];  Service: General;  Laterality: N/A;   LEFT AND RIGHT HEART CATHETERIZATION WITH CORONARY ANGIOGRAM N/A 06/11/2011   Procedure: LEFT AND RIGHT HEART CATHETERIZATION WITH CORONARY ANGIOGRAM;  Surgeon: Larey Dresser, MD;  Location: [facility] CATH LAB;  Service: Cardiovascular;  Laterality: N/A;   PERIPHERAL VASCULAR BALLOON ANGIOPLASTY Right 07/29/2020   Procedure: PERIPHERAL VASCULAR BALLOON ANGIOPLASTY;  Surgeon: Angelia Mould, MD;  Location: [place] CV LAB;  Service: Cardiovascular;  Laterality: Right;  arm fistula   REMOVAL OF STONES  05/17/2022   Procedure: REMOVAL OF STONES;  Surgeon: Rush Landmark Telford Nab., MD;  Location: [facility] ENDOS[facility];  Service: Gastroenterology;;   REVISON OF ARTERIOVENOUS FISTULA Right 08/08/2020   Procedure: ANEURYSM EXCISION OF RIGHT UPPER EXTREMITY ARTERIOVENOUS FISTULA;  Surgeon: Angelia Mould, MD;  Location: [place];  Service: Vascular;  Laterality: Right;   REVISON OF ARTERIOVENOUS FISTULA Right 03/21/2022   Procedure: EXCISION OF ULCERATED SKIN OF RIGHT ARTERIOVENOUS FISTULA REVISION;  Surgeon: Marty Heck, MD;  Location: [facility] OR;  Service: Vascular;  Laterality: Right;   SCLEROTHERAPY  05/29/2021   Procedure: Clide Deutscher;  Surgeon: Milus Banister, MD;  Location[facility] ENDOSCOPY;  Service: Gastroenterology;;   Joan Mayans  04/04/2022   Procedure: SPHINCTEROTOMY;  Surgeon: Ladene Artist, MD;  Location: [facility] ENDOSCOPY;  Service: Gastroenterology;;   Lavell Islam REMOVAL  05/29/2021   Procedure: STENT REMOVAL;  Surgeon: Milus Banister, MD;  Location: [place];  Service: Gastroenterology;;   Lavell Islam REMOVAL  05/17/2022   Procedure: STENT REMOVAL;  Surgeon: Irving Copas., MD;  Location: [facility] ENDOSCOPY;  Service: Gastroenterology;;   TEE WITHOUT CARDIOVERSION N/A 12/27/2015   Procedure: TRANSESOPHAGEAL ECHOCARDIOGRAM (TEE);  Surgeon: Sueanne Margarita, MD;  Location: [place];  Service: Cardiovascular;  Laterality: N/A;   UPPER ESOPHAGEAL ENDOSCOPIC ULTRASOUND (EUS) N/A 04/02/2022   Procedure: UPPER ESOPHAGEAL ENDOSCOPIC ULTRASOUND (EUS);  Surgeon: Irving Copas., MD;  Location: [place];  Service: Gastroenterology;  Laterality: N/A;    Social History  reports that he quit smoking about 6 years ago. His smoking use included cigarettes. He has a 20.50 pack-year smoking history. He has never used smokeless tobacco. He reports that he does not currently use alcohol after a past usage of about 3.0 standard drinks of alcohol per week. He reports that he does not use drugs.  No Known Allergies  Family History  Problem Relation Age of Onset   Alcohol abuse Mother    Liver disease Mother    Cirrhosis Mother  Heart attack Mother   Reviewed on admission  Prior to Admission medications   Medication Sig Start Date End Date Taking? Authorizing Provider  acetaminophen (TYLENOL) 500 MG tablet Take 2,000 mg by mouth in the morning, at noon, and at bedtime.    [provider]  allopurinol (ZYLOPRIM) 100 MG tablet Take 1 tablet (100 mg total) by mouth daily. 06/04/22   Dorena Dew, FNP  AURYXIA 1 GM 210 MG(Fe) tablet Take 210 mg by mouth in the morning and at bedtime. 02/11/21   [provider]  benzonatate (TESSALON) 100 MG capsule Take 1 capsule (100 mg total) by mouth 2 (two) times daily as needed for cough. 04/17/22   Renee Rival, FNP  Dextromethorphan-guaiFENesin (MUCINEX COUGH & CHEST CONGEST PO) Take 1 Capful by mouth 3 (three) times daily as needed (cough).    [provider]  diphenhydramine-acetaminophen (TYLENOL PM) 25-500 MG TABS tablet Take 2 tablets by mouth at bedtime as needed (sleep/pain).    [provider]  folic acid (FOLVITE) 1 MG tablet Take 1 tablet (1 mg total) by mouth daily. 06/04/22   Dorena Dew, FNP  hydroxyurea (HYDREA) 500 MG capsule Take 1 capsule (500 mg total) by mouth daily. 06/01/22   [name], MD  lidocaine-prilocaine (EMLA) cream Apply 1 application. topically Every Tuesday,Thursday,and Saturday with dialysis. 09/08/19   [provider]  Oxycodone HCl 10 MG TABS Take 1 tablet (10 mg total) by mouth every 6 (six) hours as needed. 05/31/22   Dorena Dew, FNP  pantoprazole (PROTONIX) 40 MG tablet Take 1 tablet (40 mg total) by mouth daily. 05/17/22   Mansouraty, Telford Nab., MD  tiZANidine (ZANAFLEX) 4 MG tablet TAKE 1/2 TABLET (2MG  TOTAL) BY MOUTH EVERY 8 HOURS AS NEEDED FOR MUSCLE SPASM 06/04/22   Dorena Dew, FNP  torsemide (DEMADEX) 20 MG tablet Take 20 mg by mouth in the morning, at noon, in the evening, and at bedtime. 09/03/21   [provider]  Vitamin D, Ergocalciferol, (DRISDOL) 1.25 MG (50000 UNIT) CAPS capsule Take 1 capsule (50,000 Units total) by mouth once a week. Monday 04/08/22   Aline August, MD    Physical Exam: Vitals:   06/07/22 0828 06/07/22 0846 06/07/22 1020  BP: 110/69  108/69  Pulse: (!) 111  (!) 103  Resp: 18  16  Temp: (!) 100.4 F (38 C)  99.6 F (37.6 C)  TempSrc: Oral  Oral  SpO2: 91%  94%  Weight:  59 kg    Height:  5\' 11"  (1.803 m)     Physical Exam Constitutional:      General: He is not in acute distress.    Appearance: Normal appearance.  HENT:     Head: Normocephalic and atraumatic.     Mouth/Throat:     Mouth: Mucous membranes are moist.     Pharynx: Oropharynx is clear.  Eyes:     Extraocular Movements: Extraocular movements intact.     Pupils: Pupils are equal, round, and reactive to light.  Cardiovascular:     Rate and Rhythm: Regular rhythm. Tachycardia present.     Pulses: Normal pulses.     Heart sounds: Normal heart sounds.  Pulmonary:     Effort: Pulmonary effort is normal. No respiratory distress.     Breath sounds: Normal breath sounds.  Abdominal:     General: Bowel sounds are normal. There is distension (mild).     Palpations: Abdomen is soft.  Tenderness: There is no abdominal tenderness.  Musculoskeletal:        General: No swelling or deformity.  Skin:    General: Skin is warm and dry.  Neurological:     General: No focal deficit present.     Mental Status: Mental status is at baseline.    Labs on Admission: I have personally reviewed following labs and imaging studies  CBC: Recent Labs  Lab 06/04/22 1013 06/07/22 0906  WBC 7.6 11.0*  NEUTROABS 5.7 9.1*  HGB 7.4* 7.4*  HCT 20.2* 20.0*  MCV 104.7* 101.0*  PLT 191 440    Basic Metabolic Panel: Recent Labs  Lab 06/04/22 1013 06/07/22 0906  NA 131* 135  K 3.9 3.8  CL 86* 91*  CO2 23 26  GLUCOSE 85 60*  BUN 42* 28*  CREATININE 6.91* 4.71*  CALCIUM 8.2* 7.5*    GFR: Estimated Creatinine Clearance: 13.2 mL/min (A) (by C-G formula based on SCr of 4.71 mg/dL (H)).  Liver Function Tests: Recent Labs  Lab 06/04/22 1013  AST 22  ALT 13  ALKPHOS 273*  BILITOT 1.4*  PROT 7.9  ALBUMIN 4.0    Urine analysis:    Component Value Date/Time   COLORURINE STRAW (A) 06/02/2018 1900   APPEARANCEUR CLEAR 06/02/2018 1900   APPEARANCEUR Clear 05/31/2017 0902   LABSPEC 1.006 06/02/2018  1900   PHURINE 7.0 06/02/2018 1900   GLUCOSEU NEGATIVE 06/02/2018 1900   HGBUR SMALL (A) 06/02/2018 1900   BILIRUBINUR neg 01/26/2020 1124   BILIRUBINUR Negative 05/31/2017 0902   KETONESUR negative 09/08/2018 1621   KETONESUR NEGATIVE 06/02/2018 1900   PROTEINUR Positive (A) 01/26/2020 1124   PROTEINUR 30 (A) 06/02/2018 1900   UROBILINOGEN 1.0 01/26/2020 1124   UROBILINOGEN 0.2 09/14/2013 1354   NITRITE neg 01/26/2020 1124   NITRITE NEGATIVE 06/02/2018 1900   LEUKOCYTESUR Negative 01/26/2020 1124   LEUKOCYTESUR Negative 05/31/2017 0902    Radiological Exams on Admission: DG Chest 2 View  Result Date: 06/07/2022 CLINICAL DATA:  Cough EXAM: CHEST - 2 VIEW COMPARISON:  06/04/22 CXR FINDINGS: No pleural effusion. No pneumothorax. Unchanged cardiac and mediastinal contours. Cardiac contours are likely at the upper limits of normal. Aortic atherosclerotic calcifications. The prominent bilateral interstitial opacities, which are nonspecific, and could represent pulmonary venous congestion or atypical infection. Visualized upper abdomen is unremarkable. Vertebral body heights are maintained. IMPRESSION: Prominent bilateral interstitial opacities, which are nonspecific, and could represent pulmonary venous congestion or atypical infection. Electronically Signed   By: Marin Roberts M.D.   On: 06/07/2022 09:33    EKG: Not yet performed in the ED.  Assessment/Plan Principal Problem:   Pneumonia Active Problems:   Sickle cell anemia (HCC)   Persistent atrial fibrillation (HCC)   Abdominal fluid collection   ESRD on HD TTS   Chronic systolic CHF (congestive heart failure) ([place])   Essential hypertension   Gout   Acute gangrenous cholecystitis s/p open cholcystectomy 05/24/2021   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 05/24/2021   COPD (chronic obstructive pulmonary disease) (HCC)   Chronic pain syndrome   Pneumonia > Patient presenting after noticing have chills with  shivering at dialysis center.  In the setting of cough for the past several weeks now noted to be tachycardic with fever to 100.4 in the ED.  Leukocytosis to 11.  Chest x-ray showing bilateral opacities suspicious for atypical pneumonia versus congestion. > Patient received ceftriaxone, azithromycin, Tylenol and 500 cc bolus in the ED. > Flu and COVID  screen negative - Monitor on telemetry - Continue with ceftriaxone and azithromycin - Given atypical presentation and borderline white count we will also check full respiratory viral panel and procalcitonin to further delineate etiology - Trend fever curve and WBC - Follow-up blood cultures  Sickle cell anemia - Continue home hydroxyurea and folate  Gout - Continue home allopurinol  Hypertension - Continue home torsemide  ESRD > On HD TTS.  Last session was today of which she completed 1-1/2 hours. > Lab workup stable without need for urgent dialysis today.  > If he remains in the hospital on Saturday or has significant laboratory abnormality prior to this we will need nephrology consult for dialysis - Continue home torsemide tomorrow  Atrial fibrillation > History of this, not currently on rate control medication nor anticoagulation.  QTc prolongation > History of this.  EKG not yet performed in the ED.  COPD > History of this in chart, not currently on any inhalers I can see.  Chronic systolic CHF > Last echo was earlier this year with EF 40-45% with indeterminate diastolic function and mildly reduced RV function.  Volume currently controlled with dialysis and torsemide - Continue home torsemide - I's and O's, daily weights  Chronic pain - Continue home oxycodone  Significant history in the past year of gallbladder empyema status post cholecystostomy and biliary stenting.  Also in 2022 had small bowel ischemia/SBO status post small bowel resection. - Noted  DVT prophylaxis: Heparin Code Status:   DNR, discussed with  patient Family Communication:  None on Admission, he states he wants to contact his family and update them. Disposition Plan:   Patient is from:  Home  Anticipated DC to:  Home  Anticipated DC date:  1 to 4 days  Anticipated DC barriers: None  Consults called:  None Admission status:  Observation, telemetry  Severity of Illness: The appropriate patient status for this patient is OBSERVATION. Observation status is judged to be reasonable and necessary in order to provide the required intensity of service to ensure the patient's safety. The patient's presenting symptoms, physical exam findings, and initial radiographic and laboratory data in the context of their medical condition is felt to place them at decreased risk for further clinical deterioration. Furthermore, it is anticipated that the patient will be medically stable for discharge from the hospital within 2 midnights of admission.    Marcelyn Bruins MD [facility]  How to contact the [facility] Attending or Consulting provider [place] or covering provider during after hours [place], for this patient?   Check the care team in [facility] and look for a) attending/consulting [facility] provider listed and b) the [facility] team listed Log into [email or web address] and use [place]'s universal password to access. If you do not have the password, please contact the hospital operator. Locate the [facility] provider you are looking for under [facility] and page to a number that you can be directly reached. If you still have difficulty reaching the provider, please page the [facility] (Director on Call) for the Hospitalists listed on amion for assistance.  06/07/2022, 12:55 PM

## 2022-06-07 NOTE — ED Triage Notes (Addendum)
EMS stated pt at dialysis and started having chills and shivering  but has had a productive cough for several weeks.  hAD A HOUR AND 30 MIN of  treatment.

## 2022-06-08 ENCOUNTER — Inpatient Hospital Stay (HOSPITAL_COMMUNITY): Payer: PPO

## 2022-06-08 ENCOUNTER — Encounter (HOSPITAL_COMMUNITY): Payer: Self-pay | Admitting: Internal Medicine

## 2022-06-08 DIAGNOSIS — I4891 Unspecified atrial fibrillation: Secondary | ICD-10-CM

## 2022-06-08 DIAGNOSIS — Z992 Dependence on renal dialysis: Secondary | ICD-10-CM | POA: Diagnosis not present

## 2022-06-08 DIAGNOSIS — I959 Hypotension, unspecified: Secondary | ICD-10-CM | POA: Diagnosis not present

## 2022-06-08 DIAGNOSIS — J189 Pneumonia, unspecified organism: Secondary | ICD-10-CM | POA: Diagnosis present

## 2022-06-08 DIAGNOSIS — H548 Legal blindness, as defined in USA: Secondary | ICD-10-CM | POA: Diagnosis present

## 2022-06-08 DIAGNOSIS — M109 Gout, unspecified: Secondary | ICD-10-CM | POA: Diagnosis present

## 2022-06-08 DIAGNOSIS — Z87891 Personal history of nicotine dependence: Secondary | ICD-10-CM | POA: Diagnosis not present

## 2022-06-08 DIAGNOSIS — Z66 Do not resuscitate: Secondary | ICD-10-CM | POA: Diagnosis present

## 2022-06-08 DIAGNOSIS — I272 Pulmonary hypertension, unspecified: Secondary | ICD-10-CM | POA: Diagnosis present

## 2022-06-08 DIAGNOSIS — I340 Nonrheumatic mitral (valve) insufficiency: Secondary | ICD-10-CM

## 2022-06-08 DIAGNOSIS — I4821 Permanent atrial fibrillation: Secondary | ICD-10-CM

## 2022-06-08 DIAGNOSIS — Z85828 Personal history of other malignant neoplasm of skin: Secondary | ICD-10-CM | POA: Diagnosis not present

## 2022-06-08 DIAGNOSIS — I5022 Chronic systolic (congestive) heart failure: Secondary | ICD-10-CM | POA: Diagnosis present

## 2022-06-08 DIAGNOSIS — D571 Sickle-cell disease without crisis: Secondary | ICD-10-CM | POA: Diagnosis present

## 2022-06-08 DIAGNOSIS — I428 Other cardiomyopathies: Secondary | ICD-10-CM | POA: Diagnosis present

## 2022-06-08 DIAGNOSIS — I5043 Acute on chronic combined systolic (congestive) and diastolic (congestive) heart failure: Secondary | ICD-10-CM | POA: Diagnosis not present

## 2022-06-08 DIAGNOSIS — N186 End stage renal disease: Secondary | ICD-10-CM | POA: Diagnosis present

## 2022-06-08 DIAGNOSIS — J440 Chronic obstructive pulmonary disease with acute lower respiratory infection: Secondary | ICD-10-CM | POA: Diagnosis present

## 2022-06-08 DIAGNOSIS — Z811 Family history of alcohol abuse and dependence: Secondary | ICD-10-CM | POA: Diagnosis not present

## 2022-06-08 DIAGNOSIS — Z1152 Encounter for screening for COVID-19: Secondary | ICD-10-CM | POA: Diagnosis not present

## 2022-06-08 DIAGNOSIS — I083 Combined rheumatic disorders of mitral, aortic and tricuspid valves: Secondary | ICD-10-CM | POA: Diagnosis present

## 2022-06-08 DIAGNOSIS — N2581 Secondary hyperparathyroidism of renal origin: Secondary | ICD-10-CM | POA: Diagnosis present

## 2022-06-08 DIAGNOSIS — I132 Hypertensive heart and chronic kidney disease with heart failure and with stage 5 chronic kidney disease, or end stage renal disease: Secondary | ICD-10-CM | POA: Diagnosis present

## 2022-06-08 DIAGNOSIS — I071 Rheumatic tricuspid insufficiency: Secondary | ICD-10-CM

## 2022-06-08 DIAGNOSIS — R188 Other ascites: Secondary | ICD-10-CM | POA: Diagnosis present

## 2022-06-08 DIAGNOSIS — G894 Chronic pain syndrome: Secondary | ICD-10-CM | POA: Diagnosis present

## 2022-06-08 DIAGNOSIS — E1122 Type 2 diabetes mellitus with diabetic chronic kidney disease: Secondary | ICD-10-CM | POA: Diagnosis present

## 2022-06-08 DIAGNOSIS — Z8249 Family history of ischemic heart disease and other diseases of the circulatory system: Secondary | ICD-10-CM | POA: Diagnosis not present

## 2022-06-08 LAB — COMPREHENSIVE METABOLIC PANEL
ALT: 14 U/L (ref 0–44)
AST: 27 U/L (ref 15–41)
Albumin: 2.7 g/dL — ABNORMAL LOW (ref 3.5–5.0)
Alkaline Phosphatase: 225 U/L — ABNORMAL HIGH (ref 38–126)
Anion gap: 17 — ABNORMAL HIGH (ref 5–15)
BUN: 39 mg/dL — ABNORMAL HIGH (ref 8–23)
CO2: 27 mmol/L (ref 22–32)
Calcium: 7.8 mg/dL — ABNORMAL LOW (ref 8.9–10.3)
Chloride: 92 mmol/L — ABNORMAL LOW (ref 98–111)
Creatinine, Ser: 6.12 mg/dL — ABNORMAL HIGH (ref 0.61–1.24)
GFR, Estimated: 10 mL/min — ABNORMAL LOW (ref >60)
Glucose, Bld: 101 mg/dL — ABNORMAL HIGH (ref 70–99)
Potassium: 3.6 mmol/L (ref 3.5–5.1)
Sodium: 136 mmol/L (ref 135–145)
Total Bilirubin: 1.6 mg/dL — ABNORMAL HIGH (ref 0.3–1.2)
Total Protein: 6.6 g/dL (ref 6.5–8.1)

## 2022-06-08 LAB — ECHOCARDIOGRAM COMPLETE
AR max vel: 2.93 cm2
AV Area VTI: 2.58 cm2
AV Area mean vel: 2.54 cm2
AV Mean grad: 3 mmHg
AV Peak grad: 4.8 mmHg
Ao pk vel: 1.1 m/s
Calc EF: 27.6 %
Height: 71 in
MV M vel: 3.57 m/s
MV Peak grad: 51 mmHg
Radius: 0.7 cm
S' Lateral: 4 cm
Single Plane A2C EF: 16.7 %
Single Plane A4C EF: 30.6 %
Weight: 2080 oz

## 2022-06-08 LAB — MAGNESIUM: Magnesium: 2 mg/dL (ref 1.7–2.4)

## 2022-06-08 LAB — PREPARE RBC (CROSSMATCH)

## 2022-06-08 LAB — HEPATITIS B SURFACE ANTIGEN: Hepatitis B Surface Ag: NONREACTIVE

## 2022-06-08 LAB — CBC
HCT: 17.6 % — ABNORMAL LOW (ref 39.0–52.0)
Hemoglobin: 6.8 g/dL — CL (ref 13.0–17.0)
MCH: 38 pg — ABNORMAL HIGH (ref 26.0–34.0)
MCHC: 38.6 g/dL — ABNORMAL HIGH (ref 30.0–36.0)
MCV: 98.3 fL (ref 80.0–100.0)
Platelets: 158 10*3/uL (ref 150–400)
RBC: 1.79 MIL/uL — ABNORMAL LOW (ref 4.22–5.81)
RDW: 22.6 % — ABNORMAL HIGH (ref 11.5–15.5)
WBC: 16 10*3/uL — ABNORMAL HIGH (ref 4.0–10.5)
nRBC: 7.1 % — ABNORMAL HIGH (ref 0.0–0.2)

## 2022-06-08 LAB — TSH: TSH: 3.116 u[IU]/mL (ref 0.350–4.500)

## 2022-06-08 LAB — HEMOGLOBIN AND HEMATOCRIT, BLOOD
HCT: 19.9 % — ABNORMAL LOW (ref 39.0–52.0)
Hemoglobin: 7.3 g/dL — ABNORMAL LOW (ref 13.0–17.0)

## 2022-06-08 LAB — PROCALCITONIN: Procalcitonin: 22.19 ng/mL

## 2022-06-08 LAB — LACTIC ACID, PLASMA: Lactic Acid, Venous: 1 mmol/L (ref 0.5–1.9)

## 2022-06-08 MED ORDER — METOPROLOL TARTRATE 5 MG/5ML IV SOLN
5.0000 mg | Freq: Once | INTRAVENOUS | Status: AC
  Administered 2022-06-08: 5 mg via INTRAVENOUS
  Filled 2022-06-08: qty 5

## 2022-06-08 MED ORDER — MIDODRINE HCL 5 MG PO TABS
2.5000 mg | ORAL_TABLET | Freq: Three times a day (TID) | ORAL | Status: DC
  Administered 2022-06-08 – 2022-06-10 (×4): 2.5 mg via ORAL
  Filled 2022-06-08 (×4): qty 1

## 2022-06-08 MED ORDER — PENTAFLUOROPROP-TETRAFLUOROETH EX AERO: 1.0000 | INHALATION_SPRAY | CUTANEOUS | Status: DC | PRN

## 2022-06-08 MED ORDER — DOXERCALCIFEROL 4 MCG/2ML IV SOLN
8.0000 ug | INTRAVENOUS | Status: DC
  Administered 2022-06-08 – 2022-06-09 (×2): 8 ug via INTRAVENOUS
  Filled 2022-06-08 (×2): qty 4

## 2022-06-08 MED ORDER — SODIUM CHLORIDE 0.9 % IV BOLUS
500.0000 mL | Freq: Once | INTRAVENOUS | Status: AC
  Administered 2022-06-08: 500 mL via INTRAVENOUS

## 2022-06-08 MED ORDER — HEPARIN SODIUM (PORCINE) 1000 UNIT/ML DIALYSIS: 1000.0000 [IU] | INTRAMUSCULAR | Status: DC | PRN

## 2022-06-08 MED ORDER — MIDODRINE HCL 5 MG PO TABS: 2.5000 mg | ORAL_TABLET | Freq: Three times a day (TID) | ORAL | Status: DC

## 2022-06-08 MED ORDER — ALTEPLASE 2 MG IJ SOLR: 2.0000 mg | Freq: Once | INTRAMUSCULAR | Status: DC | PRN

## 2022-06-08 MED ORDER — ANTICOAGULANT SODIUM CITRATE 4% (200MG/5ML) IV SOLN: 5.0000 mL | Status: DC | PRN

## 2022-06-08 MED ORDER — FERRIC CITRATE 1 GM 210 MG(FE) PO TABS
420.0000 mg | ORAL_TABLET | Freq: Three times a day (TID) | ORAL | Status: DC
  Administered 2022-06-09 – 2022-06-10 (×5): 420 mg via ORAL
  Filled 2022-06-08 (×5): qty 2

## 2022-06-08 MED ORDER — ONDANSETRON HCL 4 MG/2ML IJ SOLN
4.0000 mg | Freq: Four times a day (QID) | INTRAMUSCULAR | Status: AC | PRN
  Administered 2022-06-08: 4 mg via INTRAVENOUS
  Filled 2022-06-08: qty 2

## 2022-06-08 MED ORDER — CINACALCET HCL 30 MG PO TABS
30.0000 mg | ORAL_TABLET | ORAL | Status: DC
  Administered 2022-06-09: 30 mg via ORAL
  Filled 2022-06-08: qty 1

## 2022-06-08 MED ORDER — AZITHROMYCIN 250 MG PO TABS
500.0000 mg | ORAL_TABLET | Freq: Every day | ORAL | Status: DC
  Administered 2022-06-08 – 2022-06-10 (×3): 500 mg via ORAL
  Filled 2022-06-08 (×3): qty 2

## 2022-06-08 MED ORDER — LIDOCAINE HCL (PF) 1 % IJ SOLN: 5.0000 mL | INTRAMUSCULAR | Status: DC | PRN

## 2022-06-08 MED ORDER — LIDOCAINE-PRILOCAINE 2.5-2.5 % EX CREA: 1.0000 | TOPICAL_CREAM | CUTANEOUS | Status: DC | PRN

## 2022-06-08 MED ORDER — SODIUM CHLORIDE 0.9% IV SOLUTION: Freq: Once | INTRAVENOUS | Status: AC

## 2022-06-08 MED ORDER — CHLORHEXIDINE GLUCONATE CLOTH 2 % EX PADS: 6.0000 | MEDICATED_PAD | Freq: Every day | CUTANEOUS | Status: DC

## 2022-06-08 MED ORDER — DOXERCALCIFEROL 4 MCG/2ML IV SOLN: 8.0000 ug | INTRAVENOUS | Status: DC

## 2022-06-08 MED ORDER — TORSEMIDE 20 MG PO TABS
80.0000 mg | ORAL_TABLET | Freq: Every day | ORAL | Status: DC
  Administered 2022-06-08 – 2022-06-10 (×2): 80 mg via ORAL
  Filled 2022-06-08 (×3): qty 4

## 2022-06-08 MED ORDER — MORPHINE SULFATE (PF) 2 MG/ML IV SOLN
1.0000 mg | INTRAVENOUS | Status: DC | PRN
  Administered 2022-06-08 – 2022-06-10 (×5): 1 mg via INTRAVENOUS
  Filled 2022-06-08 (×5): qty 1

## 2022-06-08 MED ORDER — TORSEMIDE 20 MG PO TABS: 20.0000 mg | ORAL_TABLET | Freq: Two times a day (BID) | ORAL | Status: DC

## 2022-06-08 NOTE — Progress Notes (Incomplete)
Pt received one unit of blood and remained hypotensive and tachycardic. M.D. made aware. Bolus of 500 NS. Remained in the 80's over 50's. Performed EKG, pt show in A.Fib with RVR. 2nd Bolus ordered.

## 2022-06-08 NOTE — Progress Notes (Signed)
  Echocardiogram 2D Echocardiogram has been performed.  Patrick [name] 06/08/2022, 3:05 PM

## 2022-06-08 NOTE — Consult Note (Addendum)
Cardiology Consultation   Patient ID: Patrick [name] MRN: [id number]; DOB: 12/16/1955  Admit date: 06/07/2022 Date of Consult: 06/08/2022  PCP:  [name], MD   [place] Providers Cardiologist:  None        Patient Profile:   Patrick [name] is a 66 y.o. male with a hx of sickle cell anemia, GI bleed, SBO with ischemic bowel status post small bowel resection, gallbladder empyema status post cholecystostomy, s/p biliary stent, gout, hypertension, ESRD on HD, afib, COPD, CHF, chronic pain, blindness who is being seen 06/08/2022 for the evaluation of afib with RVR at the request of Dr. Hollice Gong.  History of Present Illness:   Patrick [name] was reportedly at scheduled dialysis on 12/21 when he developed chills, shivering, fatigue. Patient was able to complete about 1.5 hours of dialysis before it was also observed that he had a rapid heart rate and this ultimately prompted transport to the ED. Patient says that he has had a cough for the past several weeks. In the last few days he also noticed nausea and had increased oral intake. Denies chest pain, shortness of breath, fever, GI symptoms. Has been taking all medications as prescribed. Patient also presented to [facility] ED on 12/18 with symptoms of sickle cell crisis. No leukocytosis that day. CT abdomen pelvis noted worsening fluid over left mid to upper abdomen but deemed overall stable. Also showed small but improved right pleural effusion.  In the ED this admission, patient found with low grade fever, 100.4, tachycardia. Labs showed WBC of 11, hemoglobin 7.4. Respiratory panel was negative for influenza and COVID. CXR with prominent bilateral opacities suspicious for congestion vs atypical pneumonia. CAP coverage started and patient admitted to gen med team. Additional labwork notable for procalcitonin of 18.07.  Historically patient has been seen by Dr. Haroldine Laws, last OV 01/04/2022.  Past Medical  History:  Diagnosis Date   A-fib ([place])    Blood dyscrasia    Blood transfusion    "I've had a bunch of them"   CHF (congestive heart failure) ([place])    followed by hf clinic   Dialysis patient ([place]) 04/2019   AV fistula (right arm), TTS   Dysrhythmia    A-Fib per patient   Early satiety    Elevated LFTs 04/01/2022   Elevated troponin 04/01/2022   Empyema of gallbladder 05/27/2021   Gout    Headache(784.0)    "probably weekly" (01/13/2014)   Legally blind    Metabolic acidosis with increased anion gap and accumulation of organic acids 04/01/2022   Mitral regurgitation    Muscle tightness-Right arm  05/27/2014   Pulmonary hypertension (HCC)    Shortness of breath dyspnea    Sickle cell disease, type SS (HCC)    Swelling abdomen    Swelling of extremity, left    Swelling of extremity, right    Tricuspid regurgitation     Past Surgical History:  Procedure Laterality Date   A/V FISTULAGRAM Right 07/29/2020   Procedure: A/V FISTULAGRAM - Right Arm;  Surgeon: Angelia Mould, MD;  Location: [place] [facility];  Service: Cardiovascular;  Laterality: Right;   BASCILIC VEIN TRANSPOSITION Right 04/12/2014   Procedure: BASILIC VEIN TRANSPOSITION;  Surgeon: Elam Dutch, MD;  Location: [place];  Service: Vascular;  Laterality: Right;   BILIARY STENT PLACEMENT  04/04/2022   Procedure: BILIARY STENT PLACEMENT;  Surgeon: Ladene Artist, MD;  Location: [place];  Service: Gastroenterology;;   BIOPSY  08/04/2020  Procedure: BIOPSY;  Surgeon: Ladene Artist, MD;  Location: [place];  Service: Gastroenterology;;   BIOPSY  06/28/2021   Procedure: BIOPSY;  Surgeon: Jerene Bears, MD;  Location: [place];  Service: Gastroenterology;;   BIOPSY  04/02/2022   Procedure: BIOPSY;  Surgeon: Irving Copas., MD;  Location: [place];  Service: Gastroenterology;;   BOWEL RESECTION N/A 05/24/2021   Procedure: SMALL BOWEL RESECTION;  Surgeon: Rolm Bookbinder, MD;   Location: [place];  Service: [name];  Laterality: N/A;   CARDIOVERSION N/A 06/05/2018   Procedure: CARDIOVERSION;  Surgeon: Jolaine Artist, MD;  Location: [facility] ENDOSCOPY;  Service: Cardiovascular;  Laterality: N/A;   CARDIOVERSION N/A 10/06/2019   Procedure: CARDIOVERSION;  Surgeon: Jolaine Artist, MD;  Location: [facility] ENDOSCOPY;  Service: Cardiovascular;  Laterality: N/A;   CHOLECYSTECTOMY N/A 05/24/2021   Procedure: OPEN CHOLECYSTECTOMY;  Surgeon: Rolm Bookbinder, MD;  Location: [place];  Service: [name];  Laterality: N/A;   COLONOSCOPY WITH PROPOFOL N/A 09/24/2017   Procedure: COLONOSCOPY WITH PROPOFOL;  Surgeon: Jackquline Denmark, MD;  Location: [place];  Service: Endoscopy;  Laterality: N/A;   ENDOSCOPIC RETROGRADE CHOLANGIOPANCREATOGRAPHY (ERCP) WITH PROPOFOL N/A 05/17/2022   Procedure: ENDOSCOPIC RETROGRADE CHOLANGIOPANCREATOGRAPHY (ERCP) WITH PROPOFOL;  Surgeon: Rush Landmark Telford Nab., MD;  Location: [facility] ENDOSCOPY;  Service: Gastroenterology;  Laterality: N/A;   ERCP N/A 04/04/2022   Procedure: ENDOSCOPIC RETROGRADE CHOLANGIOPANCREATOGRAPHY (ERCP);  Surgeon: Ladene Artist, MD;  Location: [place];  Service: Gastroenterology;  Laterality: N/A;   ESOPHAGOGASTRODUODENOSCOPY N/A 09/19/2017   Procedure: ESOPHAGOGASTRODUODENOSCOPY (EGD);  Surgeon: Jackquline Denmark, MD;  Location: [facility] ENDOSCOPY;  Service: Endoscopy;  Laterality: N/A;   ESOPHAGOGASTRODUODENOSCOPY N/A 05/29/2021   Procedure: ESOPHAGOGASTRODUODENOSCOPY (EGD);  Surgeon: Milus Banister, MD;  Location: [facility] ENDOSCOPY;  Service: Gastroenterology;  Laterality: N/A;   ESOPHAGOGASTRODUODENOSCOPY (EGD) WITH PROPOFOL N/A 08/04/2020   Procedure: ESOPHAGOGASTRODUODENOSCOPY (EGD) WITH PROPOFOL;  Surgeon: Ladene Artist, MD;  Location: [place];  Service: Gastroenterology;  Laterality: N/A;   ESOPHAGOGASTRODUODENOSCOPY (EGD) WITH PROPOFOL N/A 06/28/2021   Procedure: ESOPHAGOGASTRODUODENOSCOPY (EGD) WITH PROPOFOL;  Surgeon:  Jerene Bears, MD;  Location: [facility] ENDOSCOPY;  Service: Gastroenterology;  Laterality: N/A;   ESOPHAGOGASTRODUODENOSCOPY (EGD) WITH PROPOFOL N/A 08/10/2021   Procedure: ESOPHAGOGASTRODUODENOSCOPY (EGD) WITH PROPOFOL;  Surgeon: Jackquline Denmark, MD;  Location: [place];  Service: Gastroenterology;  Laterality: N/A;   ESOPHAGOGASTRODUODENOSCOPY (EGD) WITH PROPOFOL N/A 08/02/2021   Procedure: ESOPHAGOGASTRODUODENOSCOPY (EGD) WITH PROPOFOL;  Surgeon: Lavena Bullion, DO;  Location: [place];  Service: Gastroenterology;  Laterality: N/A;   ESOPHAGOGASTRODUODENOSCOPY (EGD) WITH PROPOFOL N/A 04/02/2022   Procedure: ESOPHAGOGASTRODUODENOSCOPY (EGD) WITH PROPOFOL;  Surgeon: Rush Landmark Telford Nab., MD;  Location: [place];  Service: Gastroenterology;  Laterality: N/A;   ESOPHAGOGASTRODUODENOSCOPY (EGD) WITH PROPOFOL N/A 05/17/2022   Procedure: ESOPHAGOGASTRODUODENOSCOPY (EGD) WITH PROPOFOL;  Surgeon: Rush Landmark Telford Nab., MD;  Location: [facility] ENDOSCOPY;  Service: Gastroenterology;  Laterality: N/A;   EUS N/A 05/17/2022   Procedure: UPPER ENDOSCOPIC ULTRASOUND (EUS) RADIAL;  Surgeon: Irving Copas., MD;  Location: [facility] ENDOSCOPY;  Service: Gastroenterology;  Laterality: N/A;   FINE NEEDLE ASPIRATION  04/02/2022   Procedure: FINE NEEDLE ASPIRATION (FNA) LINEAR;  Surgeon: Irving Copas., MD;  Location: [place];  Service: Gastroenterology;;   FINE NEEDLE ASPIRATION N/A 05/17/2022   Procedure: FINE NEEDLE ASPIRATION (FNA) LINEAR;  Surgeon: Irving Copas., MD;  Location: [facility] ENDOSCOPY;  Service: Gastroenterology;  Laterality: N/A;   HEMOSTASIS CLIP PLACEMENT  05/29/2021   Procedure: HEMOSTASIS CLIP PLACEMENT;  Surgeon: Milus Banister, MD;  Location: [facility]  ENDOSCOPY;  Service: Gastroenterology;;   HOT HEMOSTASIS N/A 05/29/2021   Procedure: HOT HEMOSTASIS (ARGON PLASMA COAGULATION/BICAP);  Surgeon: Milus Banister, MD;  Location: [facility] ENDOSCOPY;  Service: Gastroenterology;   Laterality: N/A;   HOT HEMOSTASIS N/A 06/28/2021   Procedure: HOT HEMOSTASIS (ARGON PLASMA COAGULATION/BICAP);  Surgeon: Jerene Bears, MD;  Location: [facility] ENDOSCOPY;  Service: Gastroenterology;  Laterality: N/A;   HOT HEMOSTASIS N/A 08/10/2021   Procedure: HOT HEMOSTASIS (ARGON PLASMA COAGULATION/BICAP);  Surgeon: Jackquline Denmark, MD;  Location: [facility] ENDOSCOPY;  Service: Gastroenterology;  Laterality: N/A;   HOT HEMOSTASIS N/A 08/02/2021   Procedure: HOT HEMOSTASIS (ARGON PLASMA COAGULATION/BICAP);  Surgeon: Lavena Bullion, DO;  Location: [facility] ENDOSCOPY;  Service: Gastroenterology;  Laterality: N/A;   IR PERC TUN PERIT CATH WO PORT S&I /IMAG  05/23/2021   IR REMOVAL TUN CV CATH W/O FL  06/10/2021   IR US GUIDE VASC ACCESS RIGHT  05/23/2021   LAPAROSCOPY N/A 05/24/2021   Procedure: LAPAROSCOPY DIAGNOSTIC;  Surgeon: Rolm Bookbinder, MD;  Location: [place];  Service: [name];  Laterality: N/A;   LAPAROTOMY N/A 05/24/2021   Procedure: EXPLORATORY LAPAROTOMY;  Surgeon: Rolm Bookbinder, MD;  Location: [place];  Service: [name];  Laterality: N/A;   LEFT AND RIGHT HEART CATHETERIZATION WITH CORONARY ANGIOGRAM N/A 06/11/2011   Procedure: LEFT AND RIGHT HEART CATHETERIZATION WITH CORONARY ANGIOGRAM;  Surgeon: Larey Dresser, MD;  Location: [facility] CATH LAB;  Service: Cardiovascular;  Laterality: N/A;   PERIPHERAL VASCULAR BALLOON ANGIOPLASTY Right 07/29/2020   Procedure: PERIPHERAL VASCULAR BALLOON ANGIOPLASTY;  Surgeon: Angelia Mould, MD;  Location: [place] [facility];  Service: Cardiovascular;  Laterality: Right;  arm fistula   REMOVAL OF STONES  05/17/2022   Procedure: REMOVAL OF STONES;  Surgeon: Rush Landmark Telford Nab., MD;  Location: [facility];  Service: Gastroenterology;;   REVISON OF ARTERIOVENOUS FISTULA Right 08/08/2020   Procedure: ANEURYSM EXCISION OF RIGHT UPPER EXTREMITY ARTERIOVENOUS FISTULA;  Surgeon: Angelia Mould, MD;  Location: [place];  Service: Vascular;  Laterality:  Right;   REVISON OF ARTERIOVENOUS FISTULA Right 03/21/2022   Procedure: EXCISION OF ULCERATED SKIN OF RIGHT ARTERIOVENOUS FISTULA REVISION;  Surgeon: Marty Heck, MD;  Location: [facility] OR;  Service: Vascular;  Laterality: Right;   SCLEROTHERAPY  05/29/2021   Procedure: Clide Deutscher;  Surgeon: Milus Banister, MD;  Location: [facility] ENDOSCOPY;  Service: Gastroenterology;;   Joan Mayans  04/04/2022   Procedure: SPHINCTEROTOMY;  Surgeon: Ladene Artist, MD;  Location: [facility] ENDOSCOPY;  Service: Gastroenterology;;   Lavell Islam REMOVAL  05/29/2021   Procedure: STENT REMOVAL;  Surgeon: Milus Banister, MD;  Location: [place];  Service: Gastroenterology;;   Lavell Islam REMOVAL  05/17/2022   Procedure: STENT REMOVAL;  Surgeon: Irving Copas., MD;  Location: [facility] ENDOSCOPY;  Service: Gastroenterology;;   TEE WITHOUT CARDIOVERSION N/A 12/27/2015   Procedure: TRANSESOPHAGEAL ECHOCARDIOGRAM (TEE);  Surgeon: Sueanne Margarita, MD;  Location: [place];  Service: Cardiovascular;  Laterality: N/A;   UPPER ESOPHAGEAL ENDOSCOPIC ULTRASOUND (EUS) N/A 04/02/2022   Procedure: UPPER ESOPHAGEAL ENDOSCOPIC ULTRASOUND (EUS);  Surgeon: Irving Copas., MD;  Location: [place];  Service: Gastroenterology;  Laterality: N/A;     Home Medications:  Prior to Admission medications   Medication Sig Start Date End Date Taking? Authorizing Provider  acetaminophen (TYLENOL) 500 MG tablet Take 1,500 mg by mouth 3 (three) times daily as needed for mild pain, moderate pain, fever or headache.   Yes [provider]  allopurinol (ZYLOPRIM) 100 MG tablet Take 1 tablet (100 mg  total) by mouth daily. 06/04/22  Yes Dorena Dew, FNP  AURYXIA 1 GM 210 MG(Fe) tablet Take 210-420 mg by mouth See admin instructions. 420 mg with every meal, 210 mg with each snack. 02/11/21  Yes [provider]  diphenhydramine-acetaminophen (TYLENOL PM) 25-500 MG TABS tablet Take 2 tablets by mouth at bedtime as needed  (sleep/pain).   Yes [provider]  folic acid (FOLVITE) 1 MG tablet Take 1 tablet (1 mg total) by mouth daily. 06/04/22  Yes Dorena Dew, FNP  hydroxyurea (HYDREA) 500 MG capsule Take 1 capsule (500 mg total) by mouth daily. 06/01/22  Yes Jegede, Marlena Clipper, MD  Iron-Vitamins (GERITOL PO) Take 15 mLs by mouth daily.   Yes [provider]  lidocaine-prilocaine (EMLA) cream Apply 1 application. topically Every Tuesday,Thursday,and Saturday with dialysis. 09/08/19  Yes [provider]  Oxycodone HCl 10 MG TABS Take 1 tablet (10 mg total) by mouth every 6 (six) hours as needed. Patient taking differently: Take 10 mg by mouth every 6 (six) hours as needed (pain). 05/31/22  Yes Dorena Dew, FNP  pantoprazole (PROTONIX) 40 MG tablet Take 1 tablet (40 mg total) by mouth daily. 05/17/22  Yes Mansouraty, Telford Nab., MD  tiZANidine (ZANAFLEX) 4 MG tablet TAKE 1/2 TABLET (2MG  TOTAL) BY MOUTH EVERY 8 HOURS AS NEEDED FOR MUSCLE SPASM Patient taking differently: Take 2 mg by mouth 3 (three) times daily as needed for muscle spasms. 06/04/22  Yes Dorena Dew, FNP  torsemide (DEMADEX) 20 MG tablet Take 20 mg by mouth in the morning, at noon, in the evening, and at bedtime. 09/03/21  Yes [provider]  Vitamin D, Ergocalciferol, (DRISDOL) 1.25 MG (50000 UNIT) CAPS capsule Take 1 capsule (50,000 Units total) by mouth once a week. Monday Patient taking differently: Take 50,000 Units by mouth every Monday. 04/08/22  Yes Aline August, MD  benzonatate (TESSALON) 100 MG capsule Take 1 capsule (100 mg total) by mouth 2 (two) times daily as needed for cough. Patient not taking: Reported on 06/07/2022 04/17/22   Renee Rival, FNP    Inpatient Medications: Scheduled Meds:  sodium chloride   Intravenous Once   allopurinol  100 mg Oral Daily   azithromycin  500 mg Oral Daily   folic acid  1 mg Oral Daily   heparin  5,000 Units Subcutaneous Q8H    hydroxyurea  500 mg Oral Daily   metoprolol tartrate  5 mg Intravenous Once   pantoprazole  40 mg Oral Daily   sodium chloride flush  3 mL Intravenous Q12H   Continuous Infusions:  cefTRIAXone (ROCEPHIN)  IV 2 g (06/08/22 1136)   PRN Meds: acetaminophen **OR** acetaminophen, morphine injection, oxyCODONE, polyethylene glycol  Allergies:   No Known Allergies  Social History:   Social History   Socioeconomic History   Marital status: Divorced    Spouse name: Not on file   Number of children: Not on file   Years of education: Not on file   Highest education level: Not on file  Occupational History   Occupation: disabled  Tobacco Use   Smoking status: Former    Packs/day: 0.50    Years: 41.00    Total pack years: 20.50    Types: Cigarettes    Quit date: 12/08/2015    Years since quitting: 6.5   Smokeless tobacco: Never  Vaping Use   Vaping Use: Never used  Substance and Sexual Activity   Alcohol use: Not Currently    Alcohol/week:  3.0 standard drinks of alcohol    Types: 1 Glasses of wine, 2 Cans of beer per week    Comment: none since starting dialysis 06/2021   Drug use: No   Sexual activity: Not Currently  Other Topics Concern   Not on file  Social History Narrative   Not on file   Social Determinants of Health   Financial Resource Strain: Low Risk  (06/02/2021)   Overall Financial Resource Strain (CARDIA)    Difficulty of Paying Living Expenses: Not very hard  Food Insecurity: No Food Insecurity (06/08/2022)   Hunger Vital Sign    Worried About Running Out of Food in the Last Year: Never true    Ran Out of Food in the Last Year: Never true  Transportation Needs: No Transportation Needs (06/08/2022)   PRAPARE - Hydrologist (Medical): No    Lack of Transportation (Non-Medical): No  Physical Activity: Sufficiently Active (10/01/2019)   Exercise Vital Sign    Days of Exercise per Week: 5 days    Minutes of Exercise per Session: 90  min  Stress: Not on file  Social Connections: Not on file  Intimate Partner Violence: Not At Risk (06/08/2022)   Humiliation, Afraid, Rape, and Kick questionnaire    Fear of Current or Ex-Partner: No    Emotionally Abused: No    Physically Abused: No    Sexually Abused: No    Family History:    Family History  Problem Relation Age of Onset   Alcohol abuse Mother    Liver disease Mother    Cirrhosis Mother    Heart attack Mother      ROS:  Please see the history of present illness.   All other ROS reviewed and negative.     Physical Exam/Data:   Vitals:   06/08/22 1235 06/08/22 1300 06/08/22 1403 06/08/22 1511  BP:  92/64 92/73 103/70  Pulse:  (!) 126 (!) 131   Resp:   16 18  Temp: 98.1 F (36.7 C)  98.3 F (36.8 C) 98.1 F (36.7 C)  TempSrc: Oral  Oral Oral  SpO2:   97% 94%  Weight:    62.5 kg  Height:    5\' 11"  (1.803 m)    Intake/Output Summary (Last 24 hours) at 06/08/2022 1521 Last data filed at 06/08/2022 0900 Gross per 24 hour  Intake 420 ml  Output --  Net 420 ml      06/08/2022    3:11 PM 06/07/2022    8:46 AM 06/04/2022    7:39 AM  Last 3 Weights  Weight (lbs) 137 lb 12.6 oz 130 lb 136 lb 6.4 oz  Weight (kg) 62.5 kg 58.968 kg 61.871 kg     Body mass index is 19.22 kg/m.  [name]:  Well nourished, well developed, in no acute distress HEENT: normal Neck: prominent EJ, JVP near mandible at 45 degree angle Vascular: No carotid bruits; Distal pulses 2+ bilaterally Cardiac:  normal S1, S2; irregularly irregular and rapid; quiet systolic murmur Lungs:  clear to auscultation bilaterally, no wheezing, rhonchi or rales  Abd: soft, nontender, no hepatomegaly  Ext: no edema Musculoskeletal:  No deformities, BUE and BLE strength normal and equal Skin: warm and dry  Neuro:  CNs 2-12 intact, no focal abnormalities noted Psych:  Normal affect   EKG:  The EKG was personally reviewed and demonstrates:  atrial fibrillation with RVR Telemetry:  Telemetry  was personally reviewed and demonstrates:  afib with RVR. Noted  both in current unit as well as on 12/18 when patient was at [facility].  Relevant CV Studies:  04/02/22 TTE  IMPRESSIONS     1. Left ventricular ejection fraction, by estimation, is 40 to 45%. The  left ventricle has mildly decreased function. The left ventricle  demonstrates global hypokinesis. The left ventricular internal cavity size  was mildly to moderately dilated. Left  ventricular diastolic parameters are indeterminate.   2. Right ventricular systolic function is mildly reduced. The right  ventricular size is moderately-to-severely enlarged. There is moderately  elevated pulmonary artery systolic pressure. The estimated right  ventricular systolic pressure is 94.8 mmHg.   3. Left atrial size was severely dilated.   4. Right atrial size was severely dilated.   5. The mitral valve is abnormal. There is at least moderate, mainly  posteriorly directed (although there are multiple MR jets)  functional/secondary mitral regurgitation likely due to LA and LV  dilation.   6. Tricuspid valve regurgitation is severe.   7. The aortic valve is tricuspid. There is mild calcification of the  aortic valve. There is mild thickening of the aortic valve. Aortic valve  regurgitation is not visualized. Aortic valve sclerosis/calcification is  present, without any evidence of  aortic stenosis.   8. The inferior vena cava is dilated in size with <50% respiratory  variability, suggesting right atrial pressure of 15 mmHg.   Comparison(s): Compared to prior TTE on 05/2021, LVEF remains stable at  40-45%, MR appears slightly less on current study, there continues to be  severe TR.   FINDINGS   Left Ventricle: Left ventricular ejection fraction, by estimation, is 40  to 45%. The left ventricle has mildly decreased function. The left  ventricle demonstrates global hypokinesis. The left ventricular internal  cavity size was mildly to  moderately  dilated. There is no left ventricular hypertrophy. Left ventricular  diastolic parameters are indeterminate.   Right Ventricle: The right ventricular size is moderately-to-severely  enlarged. No increase in right ventricular wall thickness. Right  ventricular systolic function is mildly reduced. There is moderately  elevated pulmonary artery systolic pressure. The  tricuspid regurgitant velocity is 2.87 m/s, and with an assumed right  atrial pressure of 15 mmHg, the estimated right ventricular systolic  pressure is 54.6 mmHg.   Left Atrium: Left atrial size was severely dilated.   Right Atrium: Right atrial size was severely dilated.   Pericardium: Trivial pericardial effusion is present. The pericardial  effusion is circumferential.   Mitral Valve: The mitral valve is abnormal. There is mild thickening of  the mitral valve leaflet(s). There is at least moderate mainly posteriorly  directed (although there are multiple MR jets) functional/secondary mitral  regurgitation likely due to LA  and LV dilation. mitral valve regurgitation.   Tricuspid Valve: The tricuspid valve is normal in structure. Tricuspid  valve regurgitation is severe.   Aortic Valve: The aortic valve is tricuspid. There is mild calcification  of the aortic valve. There is mild thickening of the aortic valve. Aortic  valve regurgitation is not visualized. Aortic valve  sclerosis/calcification is present, without any  evidence of aortic stenosis.   Pulmonic Valve: The pulmonic valve was normal in structure. Pulmonic valve  regurgitation is trivial.   Aorta: The aortic root and ascending aorta are structurally normal, with  no evidence of dilitation.   Venous: The inferior vena cava is dilated in size with less than 50%  respiratory variability, suggesting right atrial pressure of 15 mmHg.   IAS/Shunts:  The atrial septum is grossly normal.   Laboratory Data:  High Sensitivity Troponin:    Recent Labs  Lab 06/04/22 1013 06/04/22 1203  TROPONINIHS 40* 42*     Chemistry Recent Labs  Lab 06/04/22 1013 06/07/22 0906 06/08/22 0124  NA 131* 135 136  K 3.9 3.8 3.6  CL 86* 91* 92*  CO2 23 26 27   GLUCOSE 85 60* 101*  BUN 42* 28* 39*  CREATININE 6.91* 4.71* 6.12*  CALCIUM 8.2* 7.5* 7.8*  GFRNONAA 8* 13* 10*  ANIONGAP 22* 18* 17*    Recent Labs  Lab 06/04/22 1013 06/08/22 0124  PROT 7.9 6.6  ALBUMIN 4.0 2.7*  AST 22 27  ALT 13 14  ALKPHOS 273* 225*  BILITOT 1.4* 1.6*   Lipids No results for input(s): "CHOL", "TRIG", "HDL", "LABVLDL", "LDLCALC", "CHOLHDL" in the last 168 hours.  Hematology Recent Labs  Lab 06/04/22 1013 06/07/22 0906 06/08/22 0124 06/08/22 1252  WBC 7.6 11.0* 16.0*  --   RBC 1.93*  1.94* 1.98* 1.79*  --   HGB 7.4* 7.4* 6.8* 7.3*  HCT 20.2* 20.0* 17.6* 19.9*  MCV 104.7* 101.0* 98.3  --   MCH 38.3* 37.4* 38.0*  --   MCHC 36.6* 37.0* 38.6*  --   RDW 21.0* 22.9* 22.6*  --   PLT 191 157 158  --    Thyroid No results for input(s): "TSH", "FREET4" in the last 168 hours.  BNPNo results for input(s): "BNP", "PROBNP" in the last 168 hours.  DDimer No results for input(s): "DDIMER" in the last 168 hours.   Radiology/Studies:  DG Chest 2 View  Result Date: 06/07/2022 CLINICAL DATA:  Cough EXAM: CHEST - 2 VIEW COMPARISON:  06/04/22 CXR FINDINGS: No pleural effusion. No pneumothorax. Unchanged cardiac and mediastinal contours. Cardiac contours are likely at the upper limits of normal. Aortic atherosclerotic calcifications. The prominent bilateral interstitial opacities, which are nonspecific, and could represent pulmonary venous congestion or atypical infection. Visualized upper abdomen is unremarkable. Vertebral body heights are maintained. IMPRESSION: Prominent bilateral interstitial opacities, which are nonspecific, and could represent pulmonary venous congestion or atypical infection. Electronically Signed   By: Marin Roberts M.D.   On:  06/07/2022 09:33     Assessment and Plan:   Permanent atrial fibrillation with RVR CHA2DS2-VASc Score = 3   Patient with history of atrial fibrillation, not on rhythm/rate control or anticoagulation on admission. Now with RVR in the setting of suspected pneumonia. He is chronically anemic and also has history of GI bleeds. S/P 1 unit PRBC, 514mL saline bolus x2 today.  Suspect afib with RVR is secondary to anemia and concurrent pulmonary infection. Rate and rhythm control currently limited by ESRD and low BP. Given longstanding afib and atrial enlargement on TTE, relatively low risk for chemical cardioversion if amiodarone used. Digoxin contraindicated with ESRD and patient currently unable to tolerate beta blocker with low BP. Fortunately, he is currently without significant symptoms despite RVR 130-150 BPM. Will discuss amiodarone with Dr. Gasper Sells.  Primary focus should be managing provocating factors.   Chronic systolic congestive heart failure Severe MR and TR  Patient with LVEF 35-40% today, indeterminate diastolic function, severely elevated pulmonary artery systolic pressure. Notably volume up on exam with prominent elevation of JVP (S/P 1 unit PRBC, 559mL saline bolus x2 today) though is without dyspnea. Patient will certainly need dialysis tomorrow, possibly today. May also need midodrine to support BP and assist diuresis. Avoid additional IV fluid today. Strict I/O and daily standing weight.  Per primary team:  Pneumonia Sickle cell anemia COPD  Risk Assessment/Risk Scores:          CHA2DS2-VASc Score = 3   This indicates a 3.2% annual risk of stroke. The patient's score is based upon: CHF History: 1 HTN History: 1 Diabetes History: 0 Stroke History: 0 Vascular Disease History: 1 Age Score: 0 Gender Score: 0         For questions or updates, please contact [place] Please consult [email or web address] for contact info under    Signed, Lily Kocher, PA-C  06/08/2022 3:21 PM  Personally seen and examined. Agree with APP above with the following comments:  Briefly 66 yo M, Legally blind with Permanent Atrial Fibrillation (seen 10/23 unable to take AI due do GI Bleed, no candidate for LAA-O) HFrEF and predominantly Group II PH (30% on my evaluation today, with severe MR and TR) with ESRD. He has Sickle cell with prior SCC.   who presents with chills and shivering with HD. His HD was stopped with concerns for Nausa and infection. Low fevers and BP with IVC given.    Patient notes that he feels fine.  He doesn't remember when DCCV stopped working.  No recent bleed.  No SOB  Exam notable for JVD into his earlobes, IRIR tachycardia abdominal dullness to percussion with no LE edema Tele: AF rates as high as 140s, on exam ~ 100s  Would recommend  - he is floridly volume overloaded and needs HD, he is asymptomatic without tachypnea but needs significant volume removal prior to DC - he is on torsemide and makes a bit of urine, will restart - will start low dose midodrine- I would advise against additional bolus at this time; given the addition of midodrine EKG 06/09/22 for Qtc evaluation - he cannot take GDMT because of BP - he is presently in rate controlled permanent atrial fibrillation; if goes into symptomatic RVR the next medication he would be able to tolerate is IV amiodarone - Qtc is ok today; if IV amiodarone is needed, consider DC of Azithromycin  Rudean Haskell, MD [place]  [address] [place],  [id number] [phone]  4:23 PM

## 2022-06-08 NOTE — Progress Notes (Signed)
Pt coughing. Retook and 100 now

## 2022-06-08 NOTE — Consult Note (Addendum)
[facility] Renal Consultation Note    Indication for Consultation:  Management of ESRD/hemodialysis; anemia, hypertension/volume and secondary hyperparathyroidism PCP: [name], MD    HPI: Patrick [name] is a 66 y.o. male with ESRD on hemodialysis T,Th,S at [facility]. PMH: HFrEF D/T NICM, Afib, HTN, GIB, SBO, gout, Sickle Cell disease, SHPT. Patient is legally blind, still works for [employer] for [employer].   Patient presented to regular HD session 06/07/2022 but early into treatment developed chills, rigor, HR 140-150s. EMS was called for transport to hospital.  He completed 1 hour 33 minutes of treatment, left 0.4 kg above OP EDW. Upon arrival to ED T-99.4 BP 116/77 HR 108 RR 20 O2 sats 98% on RA.  BMET unremarkable for HD patient. Lactic acid 1.0. WBC 11.0 HGB 7.4. PLT 158. CXR revealed prominent bilateral interstitial opacities, which are nonspecific,and could represent pulmonary venous congestion or atypical infection. He rec'd ABX in ED as well as 500CC NS bolus. He was admitted for PNA. This AM he rec'd 1 unit of PRBCS for HGB 6.9. as well as another 500cc IV bolus. Cardiology was consulted for Afib.  Repeat ECHO today LVEF 35-40%, severely elevated pulmonary HTN.   Actually saw patient while blood was transfusing this AM. He was sitting up at bedside, lungs were clear posteriorly, had upper airway coarse breath sounds with occasional cough. Afib on monitor, HR 100s. We both agreed that he could wait for HD until tomorrow. However, this afternoon, he developed Afib RVR and has been transferred to [facility]. Seen in room again. This time his face is puffy. He appears tired. Still in Afib on monitor HR 110s. He has basilar crackles. He reluctantly agrees for extra HD session tonight as he has had extra volume-approximately 1170cc today.   Past Medical History:  Diagnosis Date   A-fib ([place])    Blood dyscrasia    Blood transfusion    "I've had a  bunch of them"   CHF (congestive heart failure) ([place])    followed by hf clinic   Dialysis patient ([place]) 04/2019   AV fistula (right arm), TTS   Dysrhythmia    A-Fib per patient   Early satiety    Elevated LFTs 04/01/2022   Elevated troponin 04/01/2022   Empyema of gallbladder 05/27/2021   Gout    Headache(784.0)    "probably weekly" (01/13/2014)   Legally blind    Metabolic acidosis with increased anion gap and accumulation of organic acids 04/01/2022   Mitral regurgitation    Muscle tightness-Right arm  05/27/2014   Pulmonary hypertension (HCC)    Shortness of breath dyspnea    Sickle cell disease, type SS (HCC)    Swelling abdomen    Swelling of extremity, left    Swelling of extremity, right    Tricuspid regurgitation    Past Surgical History:  Procedure Laterality Date   A/V FISTULAGRAM Right 07/29/2020   Procedure: A/V FISTULAGRAM - Right Arm;  Surgeon: Angelia Mould, MD;  Location: [place] CV LAB;  Service: Cardiovascular;  Laterality: Right;   BASCILIC VEIN TRANSPOSITION Right 04/12/2014   Procedure: BASILIC VEIN TRANSPOSITION;  Surgeon: Elam Dutch, MD;  Location: [place];  Service: Vascular;  Laterality: Right;   BILIARY STENT PLACEMENT  04/04/2022   Procedure: BILIARY STENT PLACEMENT;  Surgeon: Ladene Artist, MD;  Location: [facility] ENDOSCOPY;  Service: Gastroenterology;;   BIOPSY  08/04/2020   Procedure: BIOPSY;  Surgeon: Ladene Artist, MD;  Location:  [facility] ENDOSCOPY;  Service: Gastroenterology;;   BIOPSY  06/28/2021   Procedure: BIOPSY;  Surgeon: Jerene Bears, MD;  Location: [place];  Service: Gastroenterology;;   BIOPSY  04/02/2022   Procedure: BIOPSY;  Surgeon: Irving Copas., MD;  Location: [place];  Service: Gastroenterology;;   BOWEL RESECTION N/A 05/24/2021   Procedure: SMALL BOWEL RESECTION;  Surgeon: Rolm Bookbinder, MD;  Location: [place];  Service: General;  Laterality: N/A;   CARDIOVERSION N/A 06/05/2018   Procedure:  CARDIOVERSION;  Surgeon: Jolaine Artist, MD;  Location: [facility] ENDOSCOPY;  Service: Cardiovascular;  Laterality: N/A;   CARDIOVERSION N/A 10/06/2019   Procedure: CARDIOVERSION;  Surgeon: Jolaine Artist, MD;  Location: [facility] ENDOSCOPY;  Service: Cardiovascular;  Laterality: N/A;   CHOLECYSTECTOMY N/A 05/24/2021   Procedure: OPEN CHOLECYSTECTOMY;  Surgeon: Rolm Bookbinder, MD;  Location: [place];  Service: General;  Laterality: N/A;   COLONOSCOPY WITH PROPOFOL N/A 09/24/2017   Procedure: COLONOSCOPY WITH PROPOFOL;  Surgeon: Jackquline Denmark, MD;  Location: [place];  Service: Endoscopy;  Laterality: N/A;   ENDOSCOPIC RETROGRADE CHOLANGIOPANCREATOGRAPHY (ERCP) WITH PROPOFOL N/A 05/17/2022   Procedure: ENDOSCOPIC RETROGRADE CHOLANGIOPANCREATOGRAPHY (ERCP) WITH PROPOFOL;  Surgeon: Rush Landmark Telford Nab., MD;  Location: [facility] ENDOSCOPY;  Service: Gastroenterology;  Laterality: N/A;   ERCP N/A 04/04/2022   Procedure: ENDOSCOPIC RETROGRADE CHOLANGIOPANCREATOGRAPHY (ERCP);  Surgeon: Ladene Artist, MD;  Location: [place];  Service: Gastroenterology;  Laterality: N/A;   ESOPHAGOGASTRODUODENOSCOPY N/A 09/19/2017   Procedure: ESOPHAGOGASTRODUODENOSCOPY (EGD);  Surgeon: Jackquline Denmark, MD;  Location: [facility] ENDOSCOPY;  Service: Endoscopy;  Laterality: N/A;   ESOPHAGOGASTRODUODENOSCOPY N/A 05/29/2021   Procedure: ESOPHAGOGASTRODUODENOSCOPY (EGD);  Surgeon: Milus Banister, MD;  Location: [facility] ENDOSCOPY;  Service: Gastroenterology;  Laterality: N/A;   ESOPHAGOGASTRODUODENOSCOPY (EGD) WITH PROPOFOL N/A 08/04/2020   Procedure: ESOPHAGOGASTRODUODENOSCOPY (EGD) WITH PROPOFOL;  Surgeon: Ladene Artist, MD;  Location: [place];  Service: Gastroenterology;  Laterality: N/A;   ESOPHAGOGASTRODUODENOSCOPY (EGD) WITH PROPOFOL N/A 06/28/2021   Procedure: ESOPHAGOGASTRODUODENOSCOPY (EGD) WITH PROPOFOL;  Surgeon: Jerene Bears, MD;  Location: [facility] ENDOSCOPY;  Service: Gastroenterology;  Laterality: N/A;    ESOPHAGOGASTRODUODENOSCOPY (EGD) WITH PROPOFOL N/A 08/10/2021   Procedure: ESOPHAGOGASTRODUODENOSCOPY (EGD) WITH PROPOFOL;  Surgeon: Jackquline Denmark, MD;  Location: [place];  Service: Gastroenterology;  Laterality: N/A;   ESOPHAGOGASTRODUODENOSCOPY (EGD) WITH PROPOFOL N/A 08/02/2021   Procedure: ESOPHAGOGASTRODUODENOSCOPY (EGD) WITH PROPOFOL;  Surgeon: Lavena Bullion, DO;  Location: [place];  Service: Gastroenterology;  Laterality: N/A;   ESOPHAGOGASTRODUODENOSCOPY (EGD) WITH PROPOFOL N/A 04/02/2022   Procedure: ESOPHAGOGASTRODUODENOSCOPY (EGD) WITH PROPOFOL;  Surgeon: Rush Landmark Telford Nab., MD;  Location: [place];  Service: Gastroenterology;  Laterality: N/A;   ESOPHAGOGASTRODUODENOSCOPY (EGD) WITH PROPOFOL N/A 05/17/2022   Procedure: ESOPHAGOGASTRODUODENOSCOPY (EGD) WITH PROPOFOL;  Surgeon: Rush Landmark Telford Nab., MD;  Location: [facility] ENDOSCOPY;  Service: Gastroenterology;  Laterality: N/A;   EUS N/A 05/17/2022   Procedure: UPPER ENDOSCOPIC ULTRASOUND (EUS) RADIAL;  Surgeon: Irving Copas., MD;  Location: [facility] ENDOSCOPY;  Service: Gastroenterology;  Laterality: N/A;   FINE NEEDLE ASPIRATION  04/02/2022   Procedure: FINE NEEDLE ASPIRATION (FNA) LINEAR;  Surgeon: Irving Copas., MD;  Location: [place];  Service: Gastroenterology;;   FINE NEEDLE ASPIRATION N/A 05/17/2022   Procedure: FINE NEEDLE ASPIRATION (FNA) LINEAR;  Surgeon: Irving Copas., MD;  Location: [facility] ENDOSCOPY;  Service: Gastroenterology;  Laterality: N/A;   HEMOSTASIS CLIP PLACEMENT  05/29/2021   Procedure: HEMOSTASIS CLIP PLACEMENT;  Surgeon: Milus Banister, MD;  Location: [place];  Service: Gastroenterology;;   HOT HEMOSTASIS N/A 05/29/2021  Procedure: HOT HEMOSTASIS (ARGON PLASMA COAGULATION/BICAP);  Surgeon: Milus Banister, MD;  Location: [facility] ENDOSCOPY;  Service: Gastroenterology;  Laterality: N/A;   HOT HEMOSTASIS N/A 06/28/2021   Procedure: HOT HEMOSTASIS (ARGON PLASMA  COAGULATION/BICAP);  Surgeon: Jerene Bears, MD;  Location: [facility] ENDOSCOPY;  Service: Gastroenterology;  Laterality: N/A;   HOT HEMOSTASIS N/A 08/10/2021   Procedure: HOT HEMOSTASIS (ARGON PLASMA COAGULATION/BICAP);  Surgeon: Jackquline Denmark, MD;  Location: [facility] ENDOSCOPY;  Service: Gastroenterology;  Laterality: N/A;   HOT HEMOSTASIS N/A 08/02/2021   Procedure: HOT HEMOSTASIS (ARGON PLASMA COAGULATION/BICAP);  Surgeon: Lavena Bullion, DO;  Location: [facility] ENDOSCOPY;  Service: Gastroenterology;  Laterality: N/A;   IR PERC TUN PERIT CATH WO PORT S&I /IMAG  05/23/2021   IR REMOVAL TUN CV CATH W/O FL  06/10/2021   IR US GUIDE VASC ACCESS RIGHT  05/23/2021   LAPAROSCOPY N/A 05/24/2021   Procedure: LAPAROSCOPY DIAGNOSTIC;  Surgeon: Rolm Bookbinder, MD;  Location: [place];  Service: General;  Laterality: N/A;   LAPAROTOMY N/A 05/24/2021   Procedure: EXPLORATORY LAPAROTOMY;  Surgeon: Rolm Bookbinder, MD;  Location: [place];  Service: General;  Laterality: N/A;   LEFT AND RIGHT HEART CATHETERIZATION WITH CORONARY ANGIOGRAM N/A 06/11/2011   Procedure: LEFT AND RIGHT HEART CATHETERIZATION WITH CORONARY ANGIOGRAM;  Surgeon: Larey Dresser, MD;  Location: [facility] CATH LAB;  Service: Cardiovascular;  Laterality: N/A;   PERIPHERAL VASCULAR BALLOON ANGIOPLASTY Right 07/29/2020   Procedure: PERIPHERAL VASCULAR BALLOON ANGIOPLASTY;  Surgeon: Angelia Mould, MD;  Location: [place] CV LAB;  Service: Cardiovascular;  Laterality: Right;  arm fistula   REMOVAL OF STONES  05/17/2022   Procedure: REMOVAL OF STONES;  Surgeon: Rush Landmark Telford Nab., MD;  Location: [facility] ENDOSCOPY;  Service: Gastroenterology;;   REVISON OF ARTERIOVENOUS FISTULA Right 08/08/2020   Procedure: ANEURYSM EXCISION OF RIGHT UPPER EXTREMITY ARTERIOVENOUS FISTULA;  Surgeon: Angelia Mould, MD;  Location: [place];  Service: Vascular;  Laterality: Right;   REVISON OF ARTERIOVENOUS FISTULA Right 03/21/2022   Procedure: EXCISION OF ULCERATED  SKIN OF RIGHT ARTERIOVENOUS FISTULA REVISION;  Surgeon: Marty Heck, MD;  Location: [facility] OR;  Service: Vascular;  Laterality: Right;   SCLEROTHERAPY  05/29/2021   Procedure: Clide Deutscher;  Surgeon: Milus Banister, MD;  Location: [facility] ENDOSCOPY;  Service: Gastroenterology;;   Joan Mayans  04/04/2022   Procedure: SPHINCTEROTOMY;  Surgeon: Ladene Artist, MD;  Location: [facility] ENDOSCOPY;  Service: Gastroenterology;;   Lavell Islam REMOVAL  05/29/2021   Procedure: STENT REMOVAL;  Surgeon: Milus Banister, MD;  Location: [place];  Service: Gastroenterology;;   Lavell Islam REMOVAL  05/17/2022   Procedure: STENT REMOVAL;  Surgeon: Irving Copas., MD;  Location: [facility] ENDOSCOPY;  Service: Gastroenterology;;   TEE WITHOUT CARDIOVERSION N/A 12/27/2015   Procedure: TRANSESOPHAGEAL ECHOCARDIOGRAM (TEE);  Surgeon: Sueanne Margarita, MD;  Location: [place];  Service: Cardiovascular;  Laterality: N/A;   UPPER ESOPHAGEAL ENDOSCOPIC ULTRASOUND (EUS) N/A 04/02/2022   Procedure: UPPER ESOPHAGEAL ENDOSCOPIC ULTRASOUND (EUS);  Surgeon: Irving Copas., MD;  Location: [place];  Service: Gastroenterology;  Laterality: N/A;   Family History  Problem Relation Age of Onset   Alcohol abuse Mother    Liver disease Mother    Cirrhosis Mother    Heart attack Mother    Social History:  reports that he quit smoking about 6 years ago. His smoking use included cigarettes. He has a 20.50 pack-year smoking history. He has never used smokeless tobacco. He reports that he does not currently use alcohol after a  past usage of about 3.0 standard drinks of alcohol per week. He reports that he does not use drugs. No Known Allergies Prior to Admission medications   Medication Sig Start Date End Date Taking? Authorizing Provider  acetaminophen (TYLENOL) 500 MG tablet Take 1,500 mg by mouth 3 (three) times daily as needed for mild pain, moderate pain, fever or headache.   Yes [provider]   allopurinol (ZYLOPRIM) 100 MG tablet Take 1 tablet (100 mg total) by mouth daily. 06/04/22  Yes Dorena Dew, FNP  AURYXIA 1 GM 210 MG(Fe) tablet Take 210-420 mg by mouth See admin instructions. 420 mg with every meal, 210 mg with each snack. 02/11/21  Yes [provider]  diphenhydramine-acetaminophen (TYLENOL PM) 25-500 MG TABS tablet Take 2 tablets by mouth at bedtime as needed (sleep/pain).   Yes [provider]  folic acid (FOLVITE) 1 MG tablet Take 1 tablet (1 mg total) by mouth daily. 06/04/22  Yes Dorena Dew, FNP  hydroxyurea (HYDREA) 500 MG capsule Take 1 capsule (500 mg total) by mouth daily. 06/01/22  Yes Jegede, Marlena Clipper, MD  Iron-Vitamins (GERITOL PO) Take 15 mLs by mouth daily.   Yes [provider]  lidocaine-prilocaine (EMLA) cream Apply 1 application. topically Every Tuesday,Thursday,and Saturday with dialysis. 09/08/19  Yes [provider]  Oxycodone HCl 10 MG TABS Take 1 tablet (10 mg total) by mouth every 6 (six) hours as needed. Patient taking differently: Take 10 mg by mouth every 6 (six) hours as needed (pain). 05/31/22  Yes Dorena Dew, FNP  pantoprazole (PROTONIX) 40 MG tablet Take 1 tablet (40 mg total) by mouth daily. 05/17/22  Yes Mansouraty, Telford Nab., MD  tiZANidine (ZANAFLEX) 4 MG tablet TAKE 1/2 TABLET (2MG  TOTAL) BY MOUTH EVERY 8 HOURS AS NEEDED FOR MUSCLE SPASM Patient taking differently: Take 2 mg by mouth 3 (three) times daily as needed for muscle spasms. 06/04/22  Yes Dorena Dew, FNP  torsemide (DEMADEX) 20 MG tablet Take 20 mg by mouth in the morning, at noon, in the evening, and at bedtime. 09/03/21  Yes [provider]  Vitamin D, Ergocalciferol, (DRISDOL) 1.25 MG (50000 UNIT) CAPS capsule Take 1 capsule (50,000 Units total) by mouth once a week. Monday Patient taking differently: Take 50,000 Units by mouth every Monday. 04/08/22  Yes Aline August, MD  benzonatate (TESSALON) 100 MG  capsule Take 1 capsule (100 mg total) by mouth 2 (two) times daily as needed for cough. Patient not taking: Reported on 06/07/2022 04/17/22   Renee Rival, FNP   Current Facility-Administered Medications  Medication Dose Route Frequency Provider Last Rate Last Admin   acetaminophen (TYLENOL) tablet 650 mg  650 mg Oral Q6H PRN Marcelyn Bruins, MD       Or   acetaminophen (TYLENOL) suppository 650 mg  650 mg Rectal Q6H PRN Marcelyn Bruins, MD       allopurinol (ZYLOPRIM) tablet 100 mg  100 mg Oral Daily Marcelyn Bruins, MD   100 mg at 06/08/22 3614   azithromycin (ZITHROMAX) tablet 500 mg  500 mg Oral Daily Donnamae Jude, RPH   500 mg at 06/08/22 1134   cefTRIAXone (ROCEPHIN) 2 g in sodium chloride 0.9 % 100 mL IVPB  2 g Intravenous Q24H Marcelyn Bruins, MD 200 mL/hr at 06/08/22 1136 2 g at 43/15/40 0867   folic acid (FOLVITE) tablet 1 mg  1 mg Oral Daily Marcelyn Bruins, MD   1 mg at 06/08/22  3790   heparin injection 5,000 Units  5,000 Units Subcutaneous Q8H Marcelyn Bruins, MD   5,000 Units at 06/07/22 2254   hydroxyurea (HYDREA) capsule 500 mg  500 mg Oral Daily Marcelyn Bruins, MD   500 mg at 06/08/22 1000   [START ON 06/09/2022] midodrine (PROAMATINE) tablet 2.5 mg  2.5 mg Oral TID WC Chandrasekhar, Mahesh A, MD       morphine (PF) 2 MG/ML injection 1 mg  1 mg Intravenous Q3H PRN Hollice Gong, Mir Mohammed, MD   1 mg at 06/08/22 1137   oxyCODONE (Oxy IR/ROXICODONE) immediate release tablet 10 mg  10 mg Oral Q6H PRN Marcelyn Bruins, MD   10 mg at 06/08/22 2409   pantoprazole (PROTONIX) EC tablet 40 mg  40 mg Oral Daily Marcelyn Bruins, MD   40 mg at 06/08/22 0958   polyethylene glycol (MIRALAX / GLYCOLAX) packet 17 g  17 g Oral Daily PRN Marcelyn Bruins, MD       sodium chloride flush (NS) 0.9 % injection 3 mL  3 mL Intravenous Q12H Marcelyn Bruins, MD   3 mL at 06/08/22 1005   torsemide (DEMADEX) tablet 20 mg  20 mg Oral BID Werner Lean, MD       Labs: Basic Metabolic Panel: Recent Labs  Lab 06/04/22 1013 06/07/22 0906 06/08/22 0124  NA 131* 135 136  K 3.9 3.8 3.6  CL 86* 91* 92*  CO2 23 26 27   GLUCOSE 85 60* 101*  BUN 42* 28* 39*  CREATININE 6.91* 4.71* 6.12*  CALCIUM 8.2* 7.5* 7.8*   Liver Function Tests: Recent Labs  Lab 06/04/22 1013 06/08/22 0124  AST 22 27  ALT 13 14  ALKPHOS 273* 225*  BILITOT 1.4* 1.6*  PROT 7.9 6.6  ALBUMIN 4.0 2.7*   Recent Labs  Lab 06/04/22 1013  LIPASE 24   No results for input(s): "AMMONIA" in the last 168 hours. CBC: Recent Labs  Lab 06/04/22 1013 06/07/22 0906 06/08/22 0124 06/08/22 1252  WBC 7.6 11.0* 16.0*  --   NEUTROABS 5.7 9.1*  --   --   HGB 7.4* 7.4* 6.8* 7.3*  HCT 20.2* 20.0* 17.6* 19.9*  MCV 104.7* 101.0* 98.3  --   PLT 191 157 158  --    Cardiac Enzymes: No results for input(s): "CKTOTAL", "CKMB", "CKMBINDEX", "TROPONINI" in the last 168 hours. CBG: No results for input(s): "GLUCAP" in the last 168 hours. Iron Studies: No results for input(s): "IRON", "TIBC", "TRANSFERRIN", "FERRITIN" in the last 72 hours. Studies/Results: ECHOCARDIOGRAM COMPLETE  Result Date: 06/08/2022    ECHOCARDIOGRAM REPORT   Patient Name:   Patrick [name] Date of Exam: 06/08/2022 Medical Rec #:  [id number]           Height:       71.0 in Accession #:    [id number]          Weight:       130.0 lb Date of Birth:  1955-10-12           BSA:          1.756 m Patient Age:    87 years            BP:           92/73 mmHg Patient Gender: M                   HR:  111 bpm. Exam Location:  Inpatient Procedure: 2D Echo, Cardiac Doppler and Color Doppler Indications:    Atrial fibrillation  History:        Patient has prior history of Echocardiogram examinations, most                 recent 04/02/2022. CHF, COPD, Arrythmias:Atrial Fibrillation;                 Risk Factors:Hypertension and Former Smoker. ESRD.  Sonographer:    Clayton Lefort RDCS (AE) Referring Phys:  [id number] MIR [facility]  Sonographer Comments: Technically difficult study due to poor echo windows. IMPRESSIONS  1. Left ventricular ejection fraction, by estimation, is 35 to 40%. The left ventricle has moderately decreased function. The left ventricle demonstrates global hypokinesis. There is moderate concentric left ventricular hypertrophy. Left ventricular diastolic parameters are indeterminate.  2. Right ventricular systolic function is mildly reduced. The right ventricular size is moderately enlarged. There is severely elevated pulmonary artery systolic pressure.  3. Left atrial size was severely dilated.  4. Right atrial size was severely dilated.  5. The mitral valve is degenerative. Moderate mitral valve regurgitation.  6. Tricuspid valve regurgitation is moderate to severe.  7. Aortic valve regurgitation is not visualized. Aortic valve sclerosis/calcification is present, without any evidence of aortic stenosis.  8. There is mild dilatation of the aortic root, measuring 38 mm.  9. The inferior vena cava is dilated in size with <50% respiratory variability, suggesting right atrial pressure of 15 mmHg. Comparison(s): No significant change from prior study. FINDINGS  Left Ventricle: Left ventricular ejection fraction, by estimation, is 35 to 40%. The left ventricle has moderately decreased function. The left ventricle demonstrates global hypokinesis. The left ventricular internal cavity size was normal in size. There is moderate concentric left ventricular hypertrophy. Left ventricular diastolic parameters are indeterminate. Right Ventricle: The right ventricular size is moderately enlarged. Right ventricular systolic function is mildly reduced. There is severely elevated pulmonary artery systolic pressure. The tricuspid regurgitant velocity is 4.21 m/s, and with an assumed right atrial pressure of 15 mmHg, the estimated right ventricular systolic pressure is 09.7 mmHg. Left Atrium: Left atrial size  was severely dilated. Right Atrium: Right atrial size was severely dilated. Pericardium: Trivial pericardial effusion is present. Mitral Valve: The mitral valve is degenerative in appearance. Moderate mitral valve regurgitation. Tricuspid Valve: Tricuspid valve regurgitation is moderate to severe. Aortic Valve: Aortic valve regurgitation is not visualized. Aortic valve sclerosis/calcification is present, without any evidence of aortic stenosis. Aortic valve mean gradient measures 3.0 mmHg. Aortic valve peak gradient measures 4.8 mmHg. Aortic valve  area, by VTI measures 2.58 cm. Pulmonic Valve: Pulmonic valve regurgitation is not visualized. Aorta: There is mild dilatation of the aortic root, measuring 38 mm. Venous: The inferior vena cava is dilated in size with less than 50% respiratory variability, suggesting right atrial pressure of 15 mmHg. IAS/Shunts: No atrial level shunt detected by color flow Doppler.  LEFT VENTRICLE PLAX 2D LVIDd:         5.60 cm LVIDs:         4.00 cm LV PW:         1.50 cm LV IVS:        1.70 cm LVOT diam:     2.20 cm LV SV:         45 LV SV Index:   26 LVOT Area:     3.80 cm  LV Volumes (MOD) LV vol d, MOD A2C: 97.5 ml  LV vol d, MOD A4C: 196.0 ml LV vol s, MOD A2C: 81.2 ml LV vol s, MOD A4C: 136.0 ml LV SV MOD A2C:     16.3 ml LV SV MOD A4C:     196.0 ml LV SV MOD BP:      42.5 ml RIGHT VENTRICLE            IVC RV Basal diam:  5.60 cm    IVC diam: 3.40 cm RV Mid diam:    4.50 cm RV S prime:     9.03 cm/s TAPSE (M-mode): 1.6 cm LEFT ATRIUM            Index        RIGHT ATRIUM           Index LA diam:      6.00 cm  3.42 cm/m   RA Area:     36.80 cm LA Vol (A2C): 82.0 ml  46.70 ml/m  RA Volume:   140.00 ml 79.74 ml/m LA Vol (A4C): 164.0 ml 93.40 ml/m  AORTIC VALVE AV Area (Vmax):    2.93 cm AV Area (Vmean):   2.54 cm AV Area (VTI):     2.58 cm AV Vmax:           110.00 cm/s AV Vmean:          87.300 cm/s AV VTI:            0.174 m AV Peak Grad:      4.8 mmHg AV Mean Grad:       3.0 mmHg LVOT Vmax:         84.80 cm/s LVOT Vmean:        58.300 cm/s LVOT VTI:          0.118 m LVOT/AV VTI ratio: 0.68  AORTA Ao Root diam: 3.80 cm MR Peak grad:    51.0 mmHg    TRICUSPID VALVE MR Mean grad:    38.0 mmHg    TR Peak grad:   70.9 mmHg MR Vmax:         357.00 cm/s  TR Vmax:        421.00 cm/s MR Vmean:        296.0 cm/s MR PISA:         3.08 cm     SHUNTS MR PISA Eff ROA: 33 mm       Systemic VTI:  0.12 m MR PISA Radius:  0.70 cm      Systemic Diam: 2.20 cm Phineas Inches Electronically signed by Phineas Inches Signature Date/Time: 06/08/2022/3:43:40 PM    Final    DG Chest 2 View  Result Date: 06/07/2022 CLINICAL DATA:  Cough EXAM: CHEST - 2 VIEW COMPARISON:  06/04/22 CXR FINDINGS: No pleural effusion. No pneumothorax. Unchanged cardiac and mediastinal contours. Cardiac contours are likely at the upper limits of normal. Aortic atherosclerotic calcifications. The prominent bilateral interstitial opacities, which are nonspecific, and could represent pulmonary venous congestion or atypical infection. Visualized upper abdomen is unremarkable. Vertebral body heights are maintained. IMPRESSION: Prominent bilateral interstitial opacities, which are nonspecific, and could represent pulmonary venous congestion or atypical infection. Electronically Signed   By: Marin Roberts M.D.   On: 06/07/2022 09:33    ROS: As per HPI otherwise negative.  Physical Exam: Vitals:   06/08/22 1235 06/08/22 1300 06/08/22 1403 06/08/22 1511  BP:  92/64 92/73 103/70  Pulse:  (!) 126 (!) 131   Resp:   16 18  Temp: 98.1 F (  36.7 C)  98.3 F (36.8 C) 98.1 F (36.7 C)  TempSrc: Oral  Oral Oral  SpO2:   97% 94%  Weight:    62.5 kg  Height:    5\' 11"  (1.803 m)     General: Chronically ill appearing male in no acute distress. Head: Normocephalic, atraumatic, sclera non-icteric, mucus membranes are moist. Face if puffy. Periorbital edema present Neck: Prominent JVD from clavicle to mandible Lungs: Bilateral breath  sounds with coarse breath sounds upper lung fields anteriorly, bibasilar crackles posteriorly. RR 16-20 no WOB.  Heart: Irreg, irreg. P5,K9 2/6 systolic M.  Abdomen: Flat, NT NABS Lower extremities: No LE edema Neuro: Alert and oriented X 3. Moves all extremities spontaneously. Psych:  Responds to questions appropriately with a normal affect. Dialysis Access: L AVF Aneurysmal with ulcerated area mid AVF +T/B  Dialysis Orders: [place] T,Th,S 4:15 180NRE 400/500 61.2 kg 2.0 K/ 2.0 Ca UFP 2 AVF - No Heparin - Hectorol 8 mcg IV TIW - Sensipar 30 mg PO TIW - Mircera 225 mcg IV q 2 weeks (last dose 05/29/2022 Next dose due 12/26/20230 - Venofer 100 mg IV weekly (last dose 06/07/2022)  Assessment/Plan:  Atypical PNA: Per primary  Afib RVR- management per primary. Cardiology consulted, suspect infection and anemia provoking.  Volume overload: Not present earlier this AM but now with evidence of volume overload by exam after receiving PRBCs and IVF bolus'. Extra HD tonight. UF as tolerated to optimize volume.   HFrEF-repeat ECHO today. EF 35-40%. Usually managed by Dr. Haroldine Laws. Cardiology consulted. Please continue daily Torsemide as ordered which is 80 mg PO daily. Optimize volume with HD.   ESRD -  T,Th,S. Short HD tomorrow to resume T,Th,S schedule.   Hypertension/volume  - BP soft. Volume as noted above.   Anemia  - H/O Sickle Cell disease. S/P 1 unit PRBCs today. HGB 7.4 now. ESA due 06/12/2022.   Metabolic bone disease -  PO4 12/21 5.7 Corrected Ca+ 8.8. continue binders, VDRA, Sensipar.   Nutrition - Albumin 2.7. A chronic issue. Protein supplements.  Rita H. Owens Shark, NP-C 06/08/2022, 4:48 PM  [employer] [phone]  I have seen and examined this patient and agree with plan and assessment in the above note with renal recommendations/intervention highlighted.  Pt developed A fib with RVR and required IV bolus of NS as well as a blood transfusion.  He now has  developed some crackles and bilateral rhonchi.  Will plan for short HD session today and again tomorrow to keep him on his TTS schedule.   Patrick John A Alonda Weaber,MD 06/08/2022 6:01 PM

## 2022-06-08 NOTE — Progress Notes (Addendum)
Progress Note   Patient: Patrick [name] UDJ:[id number] DOB: 13-Oct-1955 DOA: 06/07/2022     0 DOS: the patient was seen and examined on 06/08/2022   Brief hospital course: Patrick [name] is a 66 y.o. male with medical history significant of 6 anemia, GI bleed, SBO with ischemic bowel status post small bowel resection, gallbladder empyema status post cholecystostomy, status post biliary stent, gout, hypertension, ESRD on HD TTS, A-fib, prolonged QT, COPD, CHF, chronic pain presenting with cough, chills. He was diagnosed with CAP and admitted.   Assessment and Plan: Principal Problem:   Pneumonia Active Problems:   Sickle cell anemia (HCC)   Persistent atrial fibrillation (HCC)   Abdominal fluid collection   ESRD on HD TTS   Chronic systolic CHF (congestive heart failure) ([place])   Essential hypertension   Gout   Acute gangrenous cholecystitis s/p open cholcystectomy 05/24/2021   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 05/24/2021   COPD (chronic obstructive pulmonary disease) (HCC)   Chronic pain syndrome   Pneumonia > Patient presenting after noticing have chills with shivering at dialysis center.  In the setting of cough for the past several weeks now noted to be tachycardic with fever to 100.4 in the ED.  Leukocytosis to 11.  Chest x-ray showing bilateral opacities suspicious for atypical pneumonia versus congestion. > Patient received ceftriaxone, azithromycin, Tylenol and 500 cc bolus in the ED. > Flu and COVID screen negative - Monitor on telemetry - Continue with ceftriaxone and azithromycin - Given atypical presentation and borderline white count we will also check full respiratory viral panel and procalcitonin to further delineate etiology. Note Procal 22. - Trend fever curve and WBC - Follow-up blood cultures   Sickle cell anemia - Continue home hydroxyurea and folate - status post transfusion 12/22, recheck Hb in AM   Gout - Continue home  allopurinol   Hypertension - Continue home torsemide, but hold for now due to relative hypotension   ESRD > On HD TTS.  Last session was 12/21 of which she completed 1-1/2 hours. > Lab workup stable without need for urgent dialysis today.  > he will need nephrology consult for routine dialysis tomorrow 12/23   Atrial fibrillation > History of this, not currently on rate control medication nor anticoagulation. - went into rapid Afib rate 120s this afternoon - check TSH, Mg, recheck Hb - will give fluid bolus and IV metoprolol if pressure improves SBP > 90 - transfer to Progressive floor - discussed with cardiology who will Consult, interventions limited due to hypotension, long QT - likely went into rapid afib due to stress of anemia/hypotension/PNA   QTc prolongation > History of this.  EKG not yet performed in the ED.   COPD > History of this in chart, not currently on any inhalers I can see.   Chronic systolic CHF > Last echo was earlier this year with EF 40-45% with indeterminate diastolic function and mildly reduced RV function.  Volume currently controlled with dialysis and torsemide - Continue home torsemide - I's and O's, daily weights   Chronic pain - Continue home oxycodone   Significant history in the past year of gallbladder empyema status post cholecystostomy and biliary stenting.  Also in 2022 had small bowel ischemia/SBO status post small bowel resection. - Noted   DVT prophylaxis:      Heparin Code Status:              DNR, discussed with patient Family Communication: None on  Admission, he states he wants to contact his family and update them. Disposition Plan:              Patient is from:                        Home             Anticipated DC to:                   Home             Anticipated DC date:               1 to 4 days             Anticipated DC barriers:         None    Consults called:        None Admission status:     Observation,  telemetry      Subjective: Seen and examined with RN, he is sitting up at bedside. Has been ambulating in room, says he has felt intermittently a little dizzy. Has knee and back pain which are chronic for him and not his usual sickle cell crisis pain. He would like something stronger for the pain.  Physical Exam: Vitals:   06/08/22 0748 06/08/22 0856 06/08/22 0901 06/08/22 1008  BP: (!) 89/67 (!) 88/71 (!) 88/71 (!) 86/68  Pulse: (!) 128 (!) 135 (!) 135 (!) 132  Resp: 16  20 18   Temp: 98.1 F (36.7 C)     TempSrc: Oral  Oral   SpO2: 95% 91%  98%  Weight:      Height:      Constitutional:      General: He is not in acute distress, sitting on on edge of bed.    Appearance: Normal appearance. Thin HENT:     Head: Normocephalic and atraumatic.     Mouth/Throat:     Mouth: Mucous membranes are moist.     Pharynx: Oropharynx is clear.  Eyes:     Extraocular Movements: Extraocular movements intact.     Pupils: Pupils are equal, round, and reactive to light.  Cardiovascular:     Rate and Rhythm: Regular rhythm. Tachycardia present.     Pulses: Normal pulses.     Heart sounds: Normal heart sounds.  Pulmonary:     Effort: Pulmonary effort is normal. No respiratory distress.     Breath sounds: Normal breath sounds.  Abdominal:     General: Bowel sounds are normal. There is distension (mild).     Palpations: Abdomen is soft.     Tenderness: There is no abdominal tenderness.  Musculoskeletal:        General: No swelling or deformity.  Skin:    General: Skin is warm and dry.  Neurological:     General: No focal deficit present.     Mental Status: Mental status is at baseline.   Data Reviewed: Labs and available imaging reviewed.  Time spent: 35 minutes  Author: Deette Revak Marry Guan, MD 06/08/2022 10:27 AM  For on call review [email or web address].

## 2022-06-09 DIAGNOSIS — I4891 Unspecified atrial fibrillation: Secondary | ICD-10-CM

## 2022-06-09 LAB — RENAL FUNCTION PANEL
Albumin: 2.7 g/dL — ABNORMAL LOW (ref 3.5–5.0)
Anion gap: 12 (ref 5–15)
BUN: 22 mg/dL (ref 8–23)
CO2: 28 mmol/L (ref 22–32)
Calcium: 8.1 mg/dL — ABNORMAL LOW (ref 8.9–10.3)
Chloride: 95 mmol/L — ABNORMAL LOW (ref 98–111)
Creatinine, Ser: 4.22 mg/dL — ABNORMAL HIGH (ref 0.61–1.24)
GFR, Estimated: 15 mL/min — ABNORMAL LOW (ref >60)
Glucose, Bld: 116 mg/dL — ABNORMAL HIGH (ref 70–99)
Phosphorus: 3.3 mg/dL (ref 2.5–4.6)
Potassium: 3.5 mmol/L (ref 3.5–5.1)
Sodium: 135 mmol/L (ref 135–145)

## 2022-06-09 LAB — CBC
HCT: 19.6 % — ABNORMAL LOW (ref 39.0–52.0)
Hemoglobin: 7.1 g/dL — ABNORMAL LOW (ref 13.0–17.0)
MCH: 35.9 pg — ABNORMAL HIGH (ref 26.0–34.0)
MCHC: 36.2 g/dL — ABNORMAL HIGH (ref 30.0–36.0)
MCV: 99 fL (ref 80.0–100.0)
Platelets: 173 10*3/uL (ref 150–400)
RBC: 1.98 MIL/uL — ABNORMAL LOW (ref 4.22–5.81)
RDW: 23.5 % — ABNORMAL HIGH (ref 11.5–15.5)
WBC: 11.9 10*3/uL — ABNORMAL HIGH (ref 4.0–10.5)
nRBC: 13.6 % — ABNORMAL HIGH (ref 0.0–0.2)

## 2022-06-09 LAB — PROCALCITONIN: Procalcitonin: 18.74 ng/mL

## 2022-06-09 MED ORDER — ACETAMINOPHEN 325 MG PO TABS
650.0000 mg | ORAL_TABLET | Freq: Four times a day (QID) | ORAL | Status: DC | PRN
  Administered 2022-06-09: 650 mg via ORAL
  Filled 2022-06-09: qty 2

## 2022-06-09 MED ORDER — OXYCODONE HCL 5 MG PO TABS
10.0000 mg | ORAL_TABLET | Freq: Four times a day (QID) | ORAL | Status: DC | PRN
  Administered 2022-06-09: 10 mg via ORAL
  Filled 2022-06-09: qty 2

## 2022-06-09 NOTE — Progress Notes (Signed)
Rounding Note    Patient Name: Patrick [name] Date of Encounter: 06/09/2022  [facility] Cardiologist: Bensimhon  Subjective   No complains  Inpatient Medications    Scheduled Meds:  allopurinol  100 mg Oral Daily   azithromycin  500 mg Oral Daily   Chlorhexidine Gluconate Cloth  6 each Topical Q0600   cinacalcet  30 mg Oral Q T,Th,Sa-HD   doxercalciferol  8 mcg Intravenous Q T,Th,Sa-HD   ferric citrate  420 mg Oral TID WC   folic acid  1 mg Oral Daily   heparin  5,000 Units Subcutaneous Q8H   hydroxyurea  500 mg Oral Daily   midodrine  2.5 mg Oral TID WC   pantoprazole  40 mg Oral Daily   sodium chloride flush  3 mL Intravenous Q12H   torsemide  80 mg Oral Daily   Continuous Infusions:  cefTRIAXone (ROCEPHIN)  IV 2 g (06/08/22 1136)   PRN Meds: acetaminophen **OR** acetaminophen, morphine injection, oxyCODONE, polyethylene glycol   Vital Signs    Vitals:   06/09/22 0012 06/09/22 0018 06/09/22 0100 06/09/22 0531  BP: 98/67 91/73 98/65  95/62  Pulse: 92 94 100 93  Resp: 20 (!) 23 20 20   Temp:   98.2 F (36.8 C) 98.8 F (37.1 C)  TempSrc:   Oral Oral  SpO2: 98%  97% 96%  Weight:      Height:        Intake/Output Summary (Last 24 hours) at 06/09/2022 0850 Last data filed at 06/09/2022 0012 Gross per 24 hour  Intake 320 ml  Output 2500 ml  Net -2180 ml      06/09/2022   12:08 AM 06/08/2022    3:11 PM 06/07/2022    8:46 AM  Last 3 Weights  Weight (lbs) 136 lb 7.4 oz 137 lb 12.6 oz 130 lb  Weight (kg) 61.9 kg 62.5 kg 58.968 kg      Telemetry    Afib variable rates - Personally Reviewed  ECG    N/a - Personally Reviewed  Physical Exam   GEN: No acute distress.   Neck: + JVD Cardiac: irreg, 3/6 systolic murmur apex Respiratory: Clear to auscultation bilaterally. GI: Soft, nontender, non-distended  MS: No edema; No deformity. Neuro:  Nonfocal  Psych: Normal affect   Labs    High Sensitivity Troponin:   Recent Labs   Lab 06/04/22 1013 06/04/22 1203  TROPONINIHS 40* 42*     Chemistry Recent Labs  Lab 06/04/22 1013 06/07/22 0906 06/08/22 0124 06/08/22 1252  NA 131* 135 136  --   K 3.9 3.8 3.6  --   CL 86* 91* 92*  --   CO2 23 26 27   --   GLUCOSE 85 60* 101*  --   BUN 42* 28* 39*  --   CREATININE 6.91* 4.71* 6.12*  --   CALCIUM 8.2* 7.5* 7.8*  --   MG  --   --   --  2.0  PROT 7.9  --  6.6  --   ALBUMIN 4.0  --  2.7*  --   AST 22  --  27  --   ALT 13  --  14  --   ALKPHOS 273*  --  225*  --   BILITOT 1.4*  --  1.6*  --   GFRNONAA 8* 13* 10*  --   ANIONGAP 22* 18* 17*  --     Lipids No results for input(s): "CHOL", "TRIG", "HDL", "LABVLDL", "LDLCALC", "CHOLHDL"  in the last 168 hours.  Hematology Recent Labs  Lab 06/04/22 1013 06/07/22 0906 06/08/22 0124 06/08/22 1252  WBC 7.6 11.0* 16.0*  --   RBC 1.93*  1.94* 1.98* 1.79*  --   HGB 7.4* 7.4* 6.8* 7.3*  HCT 20.2* 20.0* 17.6* 19.9*  MCV 104.7* 101.0* 98.3  --   MCH 38.3* 37.4* 38.0*  --   MCHC 36.6* 37.0* 38.6*  --   RDW 21.0* 22.9* 22.6*  --   PLT 191 157 158  --    Thyroid  Recent Labs  Lab 06/08/22 1252  TSH 3.116    BNPNo results for input(s): "BNP", "PROBNP" in the last 168 hours.  DDimer No results for input(s): "DDIMER" in the last 168 hours.   Radiology    ECHOCARDIOGRAM COMPLETE  Result Date: 06/08/2022    ECHOCARDIOGRAM REPORT   Patient Name:   Patrick [name] Date of Exam: 06/08/2022 Medical Rec #:  [id number]           Height:       71.0 in Accession #:    [id number]          Weight:       130.0 lb Date of Birth:  1955-11-11           BSA:          1.756 m Patient Age:    66 years            BP:           92/73 mmHg Patient Gender: M                   HR:           111 bpm. Exam Location:  Inpatient Procedure: 2D Echo, Cardiac Doppler and Color Doppler Indications:    Atrial fibrillation  History:        Patient has prior history of Echocardiogram examinations, most                 recent 04/02/2022. CHF,  COPD, Arrythmias:Atrial Fibrillation;                 Risk Factors:Hypertension and Former Smoker. ESRD.  Sonographer:    Clayton Lefort RDCS (AE) Referring Phys: [id number] [facility]  Sonographer Comments: Technically difficult study due to poor echo windows. IMPRESSIONS  1. Left ventricular ejection fraction, by estimation, is 35 to 40%. The left ventricle has moderately decreased function. The left ventricle demonstrates global hypokinesis. There is moderate concentric left ventricular hypertrophy. Left ventricular diastolic parameters are indeterminate.  2. Right ventricular systolic function is mildly reduced. The right ventricular size is moderately enlarged. There is severely elevated pulmonary artery systolic pressure.  3. Left atrial size was severely dilated.  4. Right atrial size was severely dilated.  5. The mitral valve is degenerative. Moderate mitral valve regurgitation.  6. Tricuspid valve regurgitation is moderate to severe.  7. Aortic valve regurgitation is not visualized. Aortic valve sclerosis/calcification is present, without any evidence of aortic stenosis.  8. There is mild dilatation of the aortic root, measuring 38 mm.  9. The inferior vena cava is dilated in size with <50% respiratory variability, suggesting right atrial pressure of 15 mmHg. Comparison(s): No significant change from prior study. FINDINGS  Left Ventricle: Left ventricular ejection fraction, by estimation, is 35 to 40%. The left ventricle has moderately decreased function. The left ventricle demonstrates global hypokinesis. The left ventricular internal cavity size was normal in size.  There is moderate concentric left ventricular hypertrophy. Left ventricular diastolic parameters are indeterminate. Right Ventricle: The right ventricular size is moderately enlarged. Right ventricular systolic function is mildly reduced. There is severely elevated pulmonary artery systolic pressure. The tricuspid regurgitant velocity is  4.21 m/s, and with an assumed right atrial pressure of 15 mmHg, the estimated right ventricular systolic pressure is 14.9 mmHg. Left Atrium: Left atrial size was severely dilated. Right Atrium: Right atrial size was severely dilated. Pericardium: Trivial pericardial effusion is present. Mitral Valve: The mitral valve is degenerative in appearance. Moderate mitral valve regurgitation. Tricuspid Valve: Tricuspid valve regurgitation is moderate to severe. Aortic Valve: Aortic valve regurgitation is not visualized. Aortic valve sclerosis/calcification is present, without any evidence of aortic stenosis. Aortic valve mean gradient measures 3.0 mmHg. Aortic valve peak gradient measures 4.8 mmHg. Aortic valve  area, by VTI measures 2.58 cm. Pulmonic Valve: Pulmonic valve regurgitation is not visualized. Aorta: There is mild dilatation of the aortic root, measuring 38 mm. Venous: The inferior vena cava is dilated in size with less than 50% respiratory variability, suggesting right atrial pressure of 15 mmHg. IAS/Shunts: No atrial level shunt detected by color flow Doppler.  LEFT VENTRICLE PLAX 2D LVIDd:         5.60 cm LVIDs:         4.00 cm LV PW:         1.50 cm LV IVS:        1.70 cm LVOT diam:     2.20 cm LV SV:         45 LV SV Index:   26 LVOT Area:     3.80 cm  LV Volumes (MOD) LV vol d, MOD A2C: 97.5 ml LV vol d, MOD A4C: 196.0 ml LV vol s, MOD A2C: 81.2 ml LV vol s, MOD A4C: 136.0 ml LV SV MOD A2C:     16.3 ml LV SV MOD A4C:     196.0 ml LV SV MOD BP:      42.5 ml RIGHT VENTRICLE            IVC RV Basal diam:  5.60 cm    IVC diam: 3.40 cm RV Mid diam:    4.50 cm RV S prime:     9.03 cm/s TAPSE (M-mode): 1.6 cm LEFT ATRIUM            Index        RIGHT ATRIUM           Index LA diam:      6.00 cm  3.42 cm/m   RA Area:     36.80 cm LA Vol (A2C): 82.0 ml  46.70 ml/m  RA Volume:   140.00 ml 79.74 ml/m LA Vol (A4C): 164.0 ml 93.40 ml/m  AORTIC VALVE AV Area (Vmax):    2.93 cm AV Area (Vmean):   2.54 cm AV Area  (VTI):     2.58 cm AV Vmax:           110.00 cm/s AV Vmean:          87.300 cm/s AV VTI:            0.174 m AV Peak Grad:      4.8 mmHg AV Mean Grad:      3.0 mmHg LVOT Vmax:         84.80 cm/s LVOT Vmean:        58.300 cm/s LVOT VTI:  0.118 m LVOT/AV VTI ratio: 0.68  AORTA Ao Root diam: 3.80 cm MR Peak grad:    51.0 mmHg    TRICUSPID VALVE MR Mean grad:    38.0 mmHg    TR Peak grad:   70.9 mmHg MR Vmax:         357.00 cm/s  TR Vmax:        421.00 cm/s MR Vmean:        296.0 cm/s MR PISA:         3.08 cm     SHUNTS MR PISA Eff ROA: 33 mm       Systemic VTI:  0.12 m MR PISA Radius:  0.70 cm      Systemic Diam: 2.20 cm Phineas Inches Electronically signed by Phineas Inches Signature Date/Time: 06/08/2022/3:43:40 PM    Final    DG Chest 2 View  Result Date: 06/07/2022 CLINICAL DATA:  Cough EXAM: CHEST - 2 VIEW COMPARISON:  06/04/22 CXR FINDINGS: No pleural effusion. No pneumothorax. Unchanged cardiac and mediastinal contours. Cardiac contours are likely at the upper limits of normal. Aortic atherosclerotic calcifications. The prominent bilateral interstitial opacities, which are nonspecific, and could represent pulmonary venous congestion or atypical infection. Visualized upper abdomen is unremarkable. Vertebral body heights are maintained. IMPRESSION: Prominent bilateral interstitial opacities, which are nonspecific, and could represent pulmonary venous congestion or atypical infection. Electronically Signed   By: Marin Roberts M.D.   On: 06/07/2022 09:33    Cardiac Studies     Patient Profile     Patrick [name] is a 66 y.o. male with a hx of sickle cell anemia, GI bleed, SBO with ischemic bowel status post small bowel resection, gallbladder empyema status post cholecystostomy, s/p biliary stent, gout, hypertension, ESRD on HD, afib, COPD, CHF, chronic pain, blindness who is being seen 06/08/2022 for the evaluation of afib with RVR at the request of Dr. Hollice Gong.   Assessment & Plan     1.Permanent afib -not on anticoag due to history anemia, GI bleeds. Tranfusion this admit - not requiring av nodal agents at home, on  admit in setting of pneumonia afib with RVR - medical therapy limited by low bp's, ESRD. Has had some long QT chronically  - rates currently 80s, overall variable but reasonable given ongoing systemic illness. If sustained tachycardia could try amio gtt but would have high threshold for starting. I think rates will conitnue to improve as systemic illness improves.    2. Chronic HFrEF - 03/2022 echo: LVEF 40-45% - 05/2022 echo: LVEF 35-40%,  - medical therapy limited by low bp's, ESRD  3. Legally blind   4. ESRD - HD per nephrology  5. Chronic hypotension - has started midodrine.   6. Pneumonia  - per primary team  7. Anemia - per primary team  For questions or updates, please contact [place] Please consult [email or web address] for contact info under        Signed, Carlyle Dolly, MD  06/09/2022, 8:50 AM

## 2022-06-09 NOTE — Progress Notes (Signed)
Progress Note   Patient: Patrick [name] DOB: 06-May-1956 DOA: 06/07/2022     1 DOS: the patient was seen and examined on 06/09/2022   Brief hospital course: Patrick [name] is a 66 y.o. male with medical history significant of 6 anemia, GI bleed, SBO with ischemic bowel status post small bowel resection, gallbladder empyema status post cholecystostomy, status post biliary stent, gout, hypertension, ESRD on HD TTS, A-fib, prolonged QT, COPD, CHF, chronic pain presenting with cough, chills. He was diagnosed with CAP and admitted. Afternoon of 12/22 became more hypotensive with Afib RVR, was given blood 1 unit that AM and 500cc bolus x2 due to hypotension with options for rate control limited by his ESRD, hypotension, etc. Was seen by cardiology and nephrology, transferred to Progressive unit and had short HD session 12/22.   Today remains in Afib rate controlled.  Assessment and Plan: Principal Problem:   Pneumonia Active Problems:   Sickle cell anemia (HCC)   Persistent atrial fibrillation (HCC) with RVR   Abdominal fluid collection   ESRD on HD TTS   Chronic systolic CHF (congestive heart failure) ([place])   Essential hypertension   Gout   Acute gangrenous cholecystitis s/p open cholcystectomy 05/24/2021   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 05/24/2021   COPD (chronic obstructive pulmonary disease) (HCC)   Chronic pain syndrome   Pneumonia > Patient presenting after noticing have chills with shivering at dialysis center.  In the setting of cough for the past several weeks now noted to be tachycardic with fever to 100.4 in the ED.  Leukocytosis to 11.  Chest x-ray showing bilateral opacities suspicious for atypical pneumonia versus congestion. > Patient received ceftriaxone, azithromycin, Tylenol and 500 cc bolus in the ED. > Flu and COVID screen negative - Monitor on telemetry - Continue with ceftriaxone and azithromycin - Given atypical  presentation and borderline white count we will also check full respiratory viral panel and procalcitonin to further delineate etiology. Note Procal 22 on admission. - Trend fever curve and WBC - Follow-up blood cultures   Sickle cell anemia - Continue home hydroxyurea and folate - status post transfusion 12/22, recheck Hb in AM   Gout - Continue home allopurinol   Hypertension - Continue home torsemide   ESRD > On HD TTS.  nephrology following plans repeat HD this PM   Atrial fibrillation > History of this, not currently on rate control medication nor anticoagulation due to hypotension and history of bleeding - went into rapid Afib rate 120s 12/22 this afternoon - discussed with cardiology they are following - likely went into rapid afib due to stress of anemia/hypotension/PNA, seems to be stabilizing as underlying issues are treated   QTc prolongation > History of this.    COPD > History of this in chart, not currently on any inhalers I can see.   Chronic systolic CHF > Last echo was earlier this year with EF 40-45% with indeterminate diastolic function and mildly reduced RV function.  Volume currently controlled with dialysis and torsemide - Continue home torsemide - I's and O's, daily weights   Chronic pain - Continue home oxycodone   Significant history in the past year of gallbladder empyema status post cholecystostomy and biliary stenting.  Also in 2022 had small bowel ischemia/SBO status post small bowel resection. - Noted   DVT prophylaxis:      Heparin Code Status:              DNR, discussed  with patient Family Communication: None on Admission, he states he wants to contact his family and update them. Disposition Plan:              Patient is from:                        Home             Anticipated DC to:                   Home             Anticipated DC date:               1 to 4 days             Anticipated DC barriers:         None    Consults called:         None Admission status:     Observation, telemetry      Subjective:  Seen and examined on cardiac floor this AM, he is sitting up at bedside. Says his pain is not well controlled, but looks comfortable napping on his side on my arrival.  Physical Exam: Vitals:   06/09/22 0018 06/09/22 0100 06/09/22 0531 06/09/22 0832  BP: 91/73 98/65 95/62  (!) 91/57  Pulse: 94 100 93 90  Resp: (!) 23 20 20    Temp:  98.2 F (36.8 C) 98.8 F (37.1 C) 98.2 F (36.8 C)  TempSrc:  Oral Oral Oral  SpO2:  97% 96%   Weight:      Height:      Constitutional:      General: He is not in acute distress, lying in bed napping easily arousable    Appearance: Normal appearance. Thin HENT:     Head: Normocephalic and atraumatic.     Mouth/Throat:     Mouth: Mucous membranes are moist.     Pharynx: Oropharynx is clear.  Eyes:     Extraocular Movements: Extraocular movements intact.     Pupils: Pupils are equal, round, and reactive to light.  Cardiovascular:     Rate and Rhythm: Irregular rhythm. Tachycardia present.     Pulses: Normal pulses.     Heart sounds: Normal heart sounds.  Pulmonary:     Effort: Pulmonary effort is normal. No respiratory distress.     Breath sounds: Normal breath sounds.  Abdominal:     General: Bowel sounds are normal. There is distension (mild).     Palpations: Abdomen is soft.     Tenderness: There is no abdominal tenderness.  Musculoskeletal:        General: No swelling or deformity.  Skin:    General: Skin is warm and dry.  Neurological:     General: No focal deficit present.     Mental Status: Mental status is at baseline.   Data Reviewed: Labs and available imaging reviewed.  Time spent: 35 minutes  Author: Daylon Lafavor Marry Guan, MD 06/09/2022 10:38 AM  For on call review [email or web address].

## 2022-06-09 NOTE — Progress Notes (Signed)
[facility] Progress Note   Subjective: Seen lying flat in bed. Still in AFib rate 80-100. O2 sats 97% on RA. Says his pain medication isn't working because he usually takes oxycodone with tylenol. Linked orders together for him. Short HD later today to resume T,Th,S schedule. HD last night off schedule, net UF 2.5 liters.     Objective Vitals:   06/09/22 0018 06/09/22 0100 06/09/22 0531 06/09/22 0832  BP: 91/73 98/65 95/62  (!) 91/57  Pulse: 94 100 93 90  Resp: (!) 23 20 20    Temp:  98.2 F (36.8 C) 98.8 F (37.1 C) 98.2 F (36.8 C)  TempSrc:  Oral Oral Oral  SpO2:  97% 96%   Weight:      Height:       General: Chronically ill appearing male in no acute distress. Head: Normocephalic, atraumatic, sclera non-icteric, mucus membranes are moist. Face if puffy. Periorbital edema present Neck: Prominent JVD from clavicle to mandible Lungs: Bilateral breath sounds with coarse breath sounds upper lung fields anteriorly, bibasilar crackles posteriorly. RR 16-20 no WOB.  Heart: Irreg, irreg. X3,K4 2/6 systolic M.  Abdomen: Flat, NT NABS Lower extremities: No LE edema Neuro: Alert and oriented X 3. Moves all extremities spontaneously. Psych:  Responds to questions appropriately with a normal affect. Dialysis Access: L AVF Aneurysmal with ulcerated area mid AVF +T/B  Additional Objective Labs: Basic Metabolic Panel: Recent Labs  Lab 06/04/22 1013 06/07/22 0906 06/08/22 0124  NA 131* 135 136  K 3.9 3.8 3.6  CL 86* 91* 92*  CO2 23 26 27   GLUCOSE 85 60* 101*  BUN 42* 28* 39*  CREATININE 6.91* 4.71* 6.12*  CALCIUM 8.2* 7.5* 7.8*   Liver Function Tests: Recent Labs  Lab 06/04/22 1013 06/08/22 0124  AST 22 27  ALT 13 14  ALKPHOS 273* 225*  BILITOT 1.4* 1.6*  PROT 7.9 6.6  ALBUMIN 4.0 2.7*   Recent Labs  Lab 06/04/22 1013  LIPASE 24   CBC: Recent Labs  Lab 06/04/22 1013 06/07/22 0906 06/08/22 0124 06/08/22 1252  WBC 7.6 11.0* 16.0*  --    NEUTROABS 5.7 9.1*  --   --   HGB 7.4* 7.4* 6.8* 7.3*  HCT 20.2* 20.0* 17.6* 19.9*  MCV 104.7* 101.0* 98.3  --   PLT 191 157 158  --    Blood Culture    Component Value Date/Time   SDES BLOOD LEFT WRIST 06/07/2022 1308   SPECREQUEST  06/07/2022 1308    BOTTLES DRAWN AEROBIC AND ANAEROBIC Blood Culture adequate volume   CULT  06/07/2022 1308    NO GROWTH < 24 HOURS Performed at [facility] [address] [place], [place] [address]    REPTSTATUS PENDING 06/07/2022 1308    Cardiac Enzymes: No results for input(s): "CKTOTAL", "CKMB", "CKMBINDEX", "TROPONINI" in the last 168 hours. CBG: No results for input(s): "GLUCAP" in the last 168 hours. Iron Studies: No results for input(s): "IRON", "TIBC", "TRANSFERRIN", "FERRITIN" in the last 72 hours. @lablastinr3 @ Studies/Results: ECHOCARDIOGRAM COMPLETE  Result Date: 06/08/2022    ECHOCARDIOGRAM REPORT   Patient Name:   Patrick [name] Date of Exam: 06/08/2022 Medical Rec #:  [id number]           Height:       71.0 in Accession #:    [id number]          Weight:       130.0 lb Date of Birth:  [birth year]  BSA:          1.756 m Patient Age:    66 years            BP:           92/73 mmHg Patient Gender: M                   HR:           111 bpm. Exam Location:  Inpatient Procedure: 2D Echo, Cardiac Doppler and Color Doppler Indications:    Atrial fibrillation  History:        Patient has prior history of Echocardiogram examinations, most                 recent 04/02/2022. CHF, COPD, Arrythmias:Atrial Fibrillation;                 Risk Factors:Hypertension and Former Smoker. ESRD.  Sonographer:    Clayton Lefort RDCS (AE) Referring Phys: [id number] [facility]  Sonographer Comments: Technically difficult study due to poor echo windows. IMPRESSIONS  1. Left ventricular ejection fraction, by estimation, is 35 to 40%. The left ventricle has moderately decreased function. The left ventricle demonstrates global hypokinesis. There  is moderate concentric left ventricular hypertrophy. Left ventricular diastolic parameters are indeterminate.  2. Right ventricular systolic function is mildly reduced. The right ventricular size is moderately enlarged. There is severely elevated pulmonary artery systolic pressure.  3. Left atrial size was severely dilated.  4. Right atrial size was severely dilated.  5. The mitral valve is degenerative. Moderate mitral valve regurgitation.  6. Tricuspid valve regurgitation is moderate to severe.  7. Aortic valve regurgitation is not visualized. Aortic valve sclerosis/calcification is present, without any evidence of aortic stenosis.  8. There is mild dilatation of the aortic root, measuring 38 mm.  9. The inferior vena cava is dilated in size with <50% respiratory variability, suggesting right atrial pressure of 15 mmHg. Comparison(s): No significant change from prior study. FINDINGS  Left Ventricle: Left ventricular ejection fraction, by estimation, is 35 to 40%. The left ventricle has moderately decreased function. The left ventricle demonstrates global hypokinesis. The left ventricular internal cavity size was normal in size. There is moderate concentric left ventricular hypertrophy. Left ventricular diastolic parameters are indeterminate. Right Ventricle: The right ventricular size is moderately enlarged. Right ventricular systolic function is mildly reduced. There is severely elevated pulmonary artery systolic pressure. The tricuspid regurgitant velocity is 4.21 m/s, and with an assumed right atrial pressure of 15 mmHg, the estimated right ventricular systolic pressure is 41.3 mmHg. Left Atrium: Left atrial size was severely dilated. Right Atrium: Right atrial size was severely dilated. Pericardium: Trivial pericardial effusion is present. Mitral Valve: The mitral valve is degenerative in appearance. Moderate mitral valve regurgitation. Tricuspid Valve: Tricuspid valve regurgitation is moderate to severe.  Aortic Valve: Aortic valve regurgitation is not visualized. Aortic valve sclerosis/calcification is present, without any evidence of aortic stenosis. Aortic valve mean gradient measures 3.0 mmHg. Aortic valve peak gradient measures 4.8 mmHg. Aortic valve  area, by VTI measures 2.58 cm. Pulmonic Valve: Pulmonic valve regurgitation is not visualized. Aorta: There is mild dilatation of the aortic root, measuring 38 mm. Venous: The inferior vena cava is dilated in size with less than 50% respiratory variability, suggesting right atrial pressure of 15 mmHg. IAS/Shunts: No atrial level shunt detected by color flow Doppler.  LEFT VENTRICLE PLAX 2D LVIDd:         5.60 cm LVIDs:  4.00 cm LV PW:         1.50 cm LV IVS:        1.70 cm LVOT diam:     2.20 cm LV SV:         45 LV SV Index:   26 LVOT Area:     3.80 cm  LV Volumes (MOD) LV vol d, MOD A2C: 97.5 ml LV vol d, MOD A4C: 196.0 ml LV vol s, MOD A2C: 81.2 ml LV vol s, MOD A4C: 136.0 ml LV SV MOD A2C:     16.3 ml LV SV MOD A4C:     196.0 ml LV SV MOD BP:      42.5 ml RIGHT VENTRICLE            IVC RV Basal diam:  5.60 cm    IVC diam: 3.40 cm RV Mid diam:    4.50 cm RV S prime:     9.03 cm/s TAPSE (M-mode): 1.6 cm LEFT ATRIUM            Index        RIGHT ATRIUM           Index LA diam:      6.00 cm  3.42 cm/m   RA Area:     36.80 cm LA Vol (A2C): 82.0 ml  46.70 ml/m  RA Volume:   140.00 ml 79.74 ml/m LA Vol (A4C): 164.0 ml 93.40 ml/m  AORTIC VALVE AV Area (Vmax):    2.93 cm AV Area (Vmean):   2.54 cm AV Area (VTI):     2.58 cm AV Vmax:           110.00 cm/s AV Vmean:          87.300 cm/s AV VTI:            0.174 m AV Peak Grad:      4.8 mmHg AV Mean Grad:      3.0 mmHg LVOT Vmax:         84.80 cm/s LVOT Vmean:        58.300 cm/s LVOT VTI:          0.118 m LVOT/AV VTI ratio: 0.68  AORTA Ao Root diam: 3.80 cm MR Peak grad:    51.0 mmHg    TRICUSPID VALVE MR Mean grad:    38.0 mmHg    TR Peak grad:   70.9 mmHg MR Vmax:         357.00 cm/s  TR Vmax:         421.00 cm/s MR Vmean:        296.0 cm/s MR PISA:         3.08 cm     SHUNTS MR PISA Eff ROA: 33 mm       Systemic VTI:  0.12 m MR PISA Radius:  0.70 cm      Systemic Diam: 2.20 cm Phineas Inches Electronically signed by Phineas Inches Signature Date/Time: 06/08/2022/3:43:40 PM    Final    DG Chest 2 View  Result Date: 06/07/2022 CLINICAL DATA:  Cough EXAM: CHEST - 2 VIEW COMPARISON:  06/04/22 CXR FINDINGS: No pleural effusion. No pneumothorax. Unchanged cardiac and mediastinal contours. Cardiac contours are likely at the upper limits of normal. Aortic atherosclerotic calcifications. The prominent bilateral interstitial opacities, which are nonspecific, and could represent pulmonary venous congestion or atypical infection. Visualized upper abdomen is unremarkable. Vertebral body heights are maintained. IMPRESSION: Prominent bilateral interstitial opacities, which are nonspecific, and  could represent pulmonary venous congestion or atypical infection. Electronically Signed   By: Marin Roberts M.D.   On: 06/07/2022 09:33   Medications:  cefTRIAXone (ROCEPHIN)  IV 2 g (06/08/22 1136)    allopurinol  100 mg Oral Daily   azithromycin  500 mg Oral Daily   Chlorhexidine Gluconate Cloth  6 each Topical Q0600   cinacalcet  30 mg Oral Q T,Th,Sa-HD   doxercalciferol  8 mcg Intravenous Q T,Th,Sa-HD   ferric citrate  420 mg Oral TID WC   folic acid  1 mg Oral Daily   heparin  5,000 Units Subcutaneous Q8H   hydroxyurea  500 mg Oral Daily   midodrine  2.5 mg Oral TID WC   pantoprazole  40 mg Oral Daily   sodium chloride flush  3 mL Intravenous Q12H   torsemide  80 mg Oral Daily     Dialysis Orders: [place] T,Th,S 4:15 180NRE 400/500 61.2 kg 2.0 K/ 2.0 Ca UFP 2 AVF - No Heparin - Hectorol 8 mcg IV TIW - Sensipar 30 mg PO TIW - Mircera 225 mcg IV q 2 weeks (last dose 05/29/2022 Next dose due 12/26/20230 - Venofer 100 mg IV weekly (last dose 06/07/2022)   Assessment/Plan:  Atypical PNA: Per primary. BC NG 24  hrs. ABX per primary.   Afib RVR- management per primary. Cardiology consulted, suspect infection and anemia provoking.  Volume overload: Extra HD last PM-Net UF 2.5 liters. Still slightly above OP EDW but able to lie flat, denies SOB. HD again today, UF as tolerated. Optimize volume with HD. Continue Torsemide.   HFrEF-repeat ECHO today. EF 35-40%. Usually managed by Dr. Haroldine Laws. Cardiology consulted. Please continue daily Torsemide as ordered which is 80 mg PO daily. Optimize volume with HD.   ESRD -  T,Th,S. HD today.   Hypertension/volume  - BP soft. Volume as noted above.   Anemia  - H/O Sickle Cell disease. S/P 1 unit PRBCs today. HGB 7.4 now. ESA due 06/12/2022. Checking labs.   Metabolic bone disease -  PO4 12/21 5.7 Corrected Ca+ 8.8. continue binders, VDRA, Sensipar.   Nutrition - Albumin 2.7. A chronic issue. Protein supplements.    Jahanna Raether H. Charlane Westry NP-C 06/09/2022, 8:54 AM  [employer] [phone]

## 2022-06-09 NOTE — Progress Notes (Signed)
Received patient in bed to unit.  Alert and oriented.  Informed consent signed and in chart.   Treatment initiated: 1938 Treatment completed: 0000  Patient tolerated well.  Transported back to the room  Alert, without acute distress.  Hand-off given to patient's nurse.   Access used: right avf Access issues: none  Total UF removed: 2500 Medication(s) given: hectorol 70mcg iv, midodrine 2.5mg  po Post HD VS: see table below Post HD weight: 61.9 kg bed scale    06/09/22 0012  Vitals  BP 98/67  MAP (mmHg) 79  BP Location Left Arm  BP Method Automatic  Patient Position (if appropriate) Lying  Pulse Rate 92  Pulse Rate Source Monitor  ECG Heart Rate 100  Resp 20  Oxygen Therapy  SpO2 98 %  O2 Device Nasal Cannula  O2 Flow Rate (L/min) 2 L/min  Patient Activity (if Appropriate) In bed  Pulse Oximetry Type Continuous  During Treatment Monitoring  Intra-Hemodialysis Comments Tolerated well  Post Treatment  Dialyzer Clearance Clear  Duration of HD Treatment -hour(s) 4 hour(s)  Hemodialysis Intake (mL) 0 mL  Liters Processed 90.2  Fluid Removed (mL) 2500 mL  Tolerated HD Treatment Yes  Post-Hemodialysis Comments tolerated well  AVG/AVF Arterial Site Held (minutes) 5 minutes  AVG/AVF Venous Site Held (minutes) 5 minutes  Fistula / Graft Right Upper arm Arteriovenous fistula  Placement Date/Time: 08/01/21 0354   Placed prior to admission: Yes  Orientation: Right  Access Location: Upper arm  Access Type: Arteriovenous fistula  Site Condition No complications  Fistula / Graft Assessment Present;Thrill;Bruit  Status Deaccessed      [facility]

## 2022-06-09 NOTE — Progress Notes (Signed)
Received patient ,before the start of his  Hd treatment.Awake,alert and oriented 4,Denies pain Vitals stable though started treatment with a soft blood pressure.  Access used :Left upper arm fistula that works well.  Duration of treatment : 2.5 hours.  Medicine given: Hectorol 8 mcg.  Fluid removed : 1,000 cc  Hemodialysis treatment issue. Due to issue of his blood pressure ,unable to maximize the full prescribed fluid goal of  1.5 liters ,thus a liter was met.  Hands off to the patient's nurse.

## 2022-06-09 NOTE — Progress Notes (Signed)
Patient asked for other pain medicine because his current medicine was not helping him. Provider notified.

## 2022-06-09 NOTE — Progress Notes (Signed)
Pt given PRN oxy. Offered Morphine since oxy is not helping, pt stated Morphine wont help either. Asked if doctor can be paged to request another medication or dose changed. MD informed.

## 2022-06-10 DIAGNOSIS — J189 Pneumonia, unspecified organism: Secondary | ICD-10-CM | POA: Diagnosis not present

## 2022-06-10 DIAGNOSIS — N186 End stage renal disease: Secondary | ICD-10-CM | POA: Diagnosis not present

## 2022-06-10 DIAGNOSIS — I959 Hypotension, unspecified: Secondary | ICD-10-CM

## 2022-06-10 DIAGNOSIS — I4891 Unspecified atrial fibrillation: Secondary | ICD-10-CM | POA: Diagnosis not present

## 2022-06-10 LAB — PREPARE RBC (CROSSMATCH)

## 2022-06-10 MED ORDER — LEVOFLOXACIN 500 MG PO TABS: 500.0000 mg | ORAL_TABLET | Freq: Every day | ORAL | 0 refills | Status: DC

## 2022-06-10 MED ORDER — MIDODRINE HCL 2.5 MG PO TABS: 2.5000 mg | ORAL_TABLET | Freq: Three times a day (TID) | ORAL | 0 refills | Status: AC

## 2022-06-10 MED ORDER — SODIUM CHLORIDE 0.9% IV SOLUTION: Freq: Once | INTRAVENOUS | Status: AC

## 2022-06-10 MED ORDER — HYDROMORPHONE HCL 1 MG/ML IJ SOLN: 1.0000 mg | INTRAMUSCULAR | Status: DC | PRN

## 2022-06-10 MED ORDER — LEVOFLOXACIN 500 MG PO TABS: 500.0000 mg | ORAL_TABLET | ORAL | 0 refills | Status: AC

## 2022-06-10 MED ORDER — CINACALCET HCL 30 MG PO TABS: 30.0000 mg | ORAL_TABLET | ORAL | 0 refills | Status: DC

## 2022-06-10 MED ORDER — TORSEMIDE 40 MG PO TABS: 80.0000 mg | ORAL_TABLET | Freq: Every day | ORAL | 0 refills | Status: DC

## 2022-06-10 NOTE — Progress Notes (Signed)
[facility] Progress Note   Subjective: Seen in room, denies SOB. Discussed with primary. Plan is to transfuse another unit of PRBCs and discharge later today.   Objective Vitals:   06/10/22 0114 06/10/22 0354 06/10/22 0757 06/10/22 0847  BP: 92/62 95/74 107/72 101/66  Pulse:  81 96 86  Resp:  18 20 20   Temp:  98.3 F (36.8 C) (!) 97.5 F (36.4 C)   TempSrc:  Oral Oral   SpO2:  96% 100% 100%  Weight:  60.2 kg    Height:       General: Chronically ill appearing male in no acute distress. Head: Normocephalic, atraumatic, sclera non-icteric, mucus membranes are moist. Face if puffy. Periorbital edema present Neck: Prominent JVD from clavicle to mandible Lungs: Bilateral breath sounds with coarse breath sounds upper lung fields anteriorly, bibasilar crackles posteriorly. RR 16-20 no WOB.  Heart: Irreg, irreg. A4,Z6 2/6 systolic M.  Abdomen: Flat, NT NABS Lower extremities: No LE edema Neuro: Alert and oriented X 3. Moves all extremities spontaneously. Psych:  Responds to questions appropriately with a normal affect. Dialysis Access: L AVF Aneurysmal with ulcerated area mid AVF +T/B  Additional Objective Labs: Basic Metabolic Panel: Recent Labs  Lab 06/07/22 0906 06/08/22 0124 06/09/22 0930  NA 135 136 135  K 3.8 3.6 3.5  CL 91* 92* 95*  CO2 26 27 28   GLUCOSE 60* 101* 116*  BUN 28* 39* 22  CREATININE 4.71* 6.12* 4.22*  CALCIUM 7.5* 7.8* 8.1*  PHOS  --   --  3.3   Liver Function Tests: Recent Labs  Lab 06/04/22 1013 06/08/22 0124 06/09/22 0930  AST 22 27  --   ALT 13 14  --   ALKPHOS 273* 225*  --   BILITOT 1.4* 1.6*  --   PROT 7.9 6.6  --   ALBUMIN 4.0 2.7* 2.7*   Recent Labs  Lab 06/04/22 1013  LIPASE 24   CBC: Recent Labs  Lab 06/04/22 1013 06/07/22 0906 06/08/22 0124 06/08/22 1252 06/09/22 0930  WBC 7.6 11.0* 16.0*  --  11.9*  NEUTROABS 5.7 9.1*  --   --   --   HGB 7.4* 7.4* 6.8* 7.3* 7.1*  HCT 20.2* 20.0* 17.6* 19.9* 19.6*   MCV 104.7* 101.0* 98.3  --  99.0  PLT 191 157 158  --  173   Blood Culture    Component Value Date/Time   SDES BLOOD LEFT WRIST 06/07/2022 1308   SPECREQUEST  06/07/2022 1308    BOTTLES DRAWN AEROBIC AND ANAEROBIC Blood Culture adequate volume   CULT  06/07/2022 1308    NO GROWTH 3 DAYS Performed at [facility], [place] [address] [place], [place] [address]    REPTSTATUS PENDING 06/07/2022 1308    Cardiac Enzymes: No results for input(s): "CKTOTAL", "CKMB", "CKMBINDEX", "TROPONINI" in the last 168 hours. CBG: No results for input(s): "GLUCAP" in the last 168 hours. Iron Studies: No results for input(s): "IRON", "TIBC", "TRANSFERRIN", "FERRITIN" in the last 72 hours. @lablastinr3 @ Studies/Results: ECHOCARDIOGRAM COMPLETE  Result Date: 06/08/2022    ECHOCARDIOGRAM REPORT   Patient Name:   Patrick [name] Date of Exam: 06/08/2022 Medical Rec #:  [id number]           Height:       71.0 in Accession #:    [id number]          Weight:       130.0 lb Date of Birth:  11-10-1955  BSA:          1.756 m Patient Age:    66 years            BP:           92/73 mmHg Patient Gender: M                   HR:           111 bpm. Exam Location:  Inpatient Procedure: 2D Echo, Cardiac Doppler and Color Doppler Indications:    Atrial fibrillation  History:        Patient has prior history of Echocardiogram examinations, most                 recent 04/02/2022. CHF, COPD, Arrythmias:Atrial Fibrillation;                 Risk Factors:Hypertension and Former Smoker. ESRD.  Sonographer:    Clayton Lefort RDCS (AE) Referring Phys: [id number] [facility]  Sonographer Comments: Technically difficult study due to poor echo windows. IMPRESSIONS  1. Left ventricular ejection fraction, by estimation, is 35 to 40%. The left ventricle has moderately decreased function. The left ventricle demonstrates global hypokinesis. There is moderate concentric left ventricular hypertrophy. Left ventricular diastolic  parameters are indeterminate.  2. Right ventricular systolic function is mildly reduced. The right ventricular size is moderately enlarged. There is severely elevated pulmonary artery systolic pressure.  3. Left atrial size was severely dilated.  4. Right atrial size was severely dilated.  5. The mitral valve is degenerative. Moderate mitral valve regurgitation.  6. Tricuspid valve regurgitation is moderate to severe.  7. Aortic valve regurgitation is not visualized. Aortic valve sclerosis/calcification is present, without any evidence of aortic stenosis.  8. There is mild dilatation of the aortic root, measuring 38 mm.  9. The inferior vena cava is dilated in size with <50% respiratory variability, suggesting right atrial pressure of 15 mmHg. Comparison(s): No significant change from prior study. FINDINGS  Left Ventricle: Left ventricular ejection fraction, by estimation, is 35 to 40%. The left ventricle has moderately decreased function. The left ventricle demonstrates global hypokinesis. The left ventricular internal cavity size was normal in size. There is moderate concentric left ventricular hypertrophy. Left ventricular diastolic parameters are indeterminate. Right Ventricle: The right ventricular size is moderately enlarged. Right ventricular systolic function is mildly reduced. There is severely elevated pulmonary artery systolic pressure. The tricuspid regurgitant velocity is 4.21 m/s, and with an assumed right atrial pressure of 15 mmHg, the estimated right ventricular systolic pressure is 38.4 mmHg. Left Atrium: Left atrial size was severely dilated. Right Atrium: Right atrial size was severely dilated. Pericardium: Trivial pericardial effusion is present. Mitral Valve: The mitral valve is degenerative in appearance. Moderate mitral valve regurgitation. Tricuspid Valve: Tricuspid valve regurgitation is moderate to severe. Aortic Valve: Aortic valve regurgitation is not visualized. Aortic valve  sclerosis/calcification is present, without any evidence of aortic stenosis. Aortic valve mean gradient measures 3.0 mmHg. Aortic valve peak gradient measures 4.8 mmHg. Aortic valve  area, by VTI measures 2.58 cm. Pulmonic Valve: Pulmonic valve regurgitation is not visualized. Aorta: There is mild dilatation of the aortic root, measuring 38 mm. Venous: The inferior vena cava is dilated in size with less than 50% respiratory variability, suggesting right atrial pressure of 15 mmHg. IAS/Shunts: No atrial level shunt detected by color flow Doppler.  LEFT VENTRICLE PLAX 2D LVIDd:         5.60 cm LVIDs:  4.00 cm LV PW:         1.50 cm LV IVS:        1.70 cm LVOT diam:     2.20 cm LV SV:         45 LV SV Index:   26 LVOT Area:     3.80 cm  LV Volumes (MOD) LV vol d, MOD A2C: 97.5 ml LV vol d, MOD A4C: 196.0 ml LV vol s, MOD A2C: 81.2 ml LV vol s, MOD A4C: 136.0 ml LV SV MOD A2C:     16.3 ml LV SV MOD A4C:     196.0 ml LV SV MOD BP:      42.5 ml RIGHT VENTRICLE            IVC RV Basal diam:  5.60 cm    IVC diam: 3.40 cm RV Mid diam:    4.50 cm RV S prime:     9.03 cm/s TAPSE (M-mode): 1.6 cm LEFT ATRIUM            Index        RIGHT ATRIUM           Index LA diam:      6.00 cm  3.42 cm/m   RA Area:     36.80 cm LA Vol (A2C): 82.0 ml  46.70 ml/m  RA Volume:   140.00 ml 79.74 ml/m LA Vol (A4C): 164.0 ml 93.40 ml/m  AORTIC VALVE AV Area (Vmax):    2.93 cm AV Area (Vmean):   2.54 cm AV Area (VTI):     2.58 cm AV Vmax:           110.00 cm/s AV Vmean:          87.300 cm/s AV VTI:            0.174 m AV Peak Grad:      4.8 mmHg AV Mean Grad:      3.0 mmHg LVOT Vmax:         84.80 cm/s LVOT Vmean:        58.300 cm/s LVOT VTI:          0.118 m LVOT/AV VTI ratio: 0.68  AORTA Ao Root diam: 3.80 cm MR Peak grad:    51.0 mmHg    TRICUSPID VALVE MR Mean grad:    38.0 mmHg    TR Peak grad:   70.9 mmHg MR Vmax:         357.00 cm/s  TR Vmax:        421.00 cm/s MR Vmean:        296.0 cm/s MR PISA:         3.08 cm     SHUNTS  MR PISA Eff ROA: 33 mm       Systemic VTI:  0.12 m MR PISA Radius:  0.70 cm      Systemic Diam: 2.20 cm Phineas Inches Electronically signed by Phineas Inches Signature Date/Time: 06/08/2022/3:43:40 PM    Final    Medications:  cefTRIAXone (ROCEPHIN)  IV 2 g (06/09/22 1737)    sodium chloride   Intravenous Once   allopurinol  100 mg Oral Daily   azithromycin  500 mg Oral Daily   Chlorhexidine Gluconate Cloth  6 each Topical Q0600   cinacalcet  30 mg Oral Q T,Th,Sa-HD   doxercalciferol  8 mcg Intravenous Q T,Th,Sa-HD   ferric citrate  420 mg Oral TID WC   folic acid  1 mg Oral  Daily   heparin  5,000 Units Subcutaneous Q8H   hydroxyurea  500 mg Oral Daily   midodrine  2.5 mg Oral TID WC   pantoprazole  40 mg Oral Daily   sodium chloride flush  3 mL Intravenous Q12H   torsemide  80 mg Oral Daily     Dialysis Orders: [place] T,Th,S 4:15 180NRE 400/500 61.2 kg 2.0 K/ 2.0 Ca UFP 2 AVF - No Heparin - Hectorol 8 mcg IV TIW - Sensipar 30 mg PO TIW - Mircera 225 mcg IV q 2 weeks (last dose 05/29/2022 Next dose due 12/26/20230 - Venofer 100 mg IV weekly (last dose 06/07/2022)   Assessment/Plan:  Atypical PNA: Per primary. BC NG 24 hrs. ABX per primary.   Afib RVR- management per primary. Cardiology consulted, suspect infection and anemia provoking.  Volume overload: Extra HD last PM-Net UF 2.5 liters. Still slightly above OP EDW but able to lie flat, denies SOB. HD again today, UF as tolerated. Optimize volume with HD. Continue Torsemide.   HFrEF-repeat ECHO today. EF 35-40%. Usually managed by Dr. Haroldine Laws. Cardiology consulted. Please continue daily Torsemide as ordered which is 80 mg PO daily. Optimize volume with HD.   ESRD -  T,Th,S. Next HD 06/12/2022  Hypertension/volume  - BP soft. Volume as noted above.   Anemia  - H/O Sickle Cell disease. S/P 1 unit PRBCs 06/08/2022. HGB 7.1 06/09/2022. ESA due 06/12/2022. Will transfuse 1 unit today.   Metabolic bone disease -  PO4 12/21 5.7 Corrected  Ca+ 8.8. continue binders, VDRA, Sensipar.   Nutrition - Albumin 2.7. A chronic issue. Protein supplements.    Yishai Rehfeld H. Errika Narvaiz NP-C 06/10/2022, 9:46 AM  [employer] [phone]

## 2022-06-10 NOTE — Progress Notes (Signed)
   Telemetry review, cards following for history of permanent afib. ot requiring av nodal agents at home, on  admit in setting of pneumonia afib with RVR. Medical therapy limited by low bp's, ESRD. Has had some long QT chronically. Rates primarily controlled yesterday, some high rates in AM though resolved. Would expect some intermittent high rates at times given systemic illness.   No additioanl cardiology recs at this time, is sustained tachycardia/RVR please call us back. We will sign off inpatient care.       For questions or updates, please contact [facility] Please consult [email or web address] for contact info under        Signed, Carlyle Dolly, MD  06/10/2022, 8:33 AM

## 2022-06-10 NOTE — Hospital Course (Signed)
Patrick [name] is a 66 y.o. male with medical history significant of anemia, GI bleed, SBO with ischemic bowel status post small bowel resection, gallbladder empyema status post cholecystostomy, status post biliary stent, gout, hypertension, ESRD on HD TTS, A-fib, prolonged QT, COPD, CHF, chronic pain presenting with cough, chills. He was diagnosed with CAP and admitted. Afternoon of 12/22 became more hypotensive with Afib RVR, was given blood 1 unit that AM and 500cc bolus x2 due to hypotension with options for rate control limited by his ESRD, hypotension, etc. Was seen by cardiology and nephrology, transferred to Progressive unit and had short HD session 12/22.    Today remains in Afib rate controlled.    Assessment and Plan: Principal Problem:   Pneumonia Active Problems:   Sickle cell anemia (HCC)   Persistent atrial fibrillation (HCC) with RVR   Abdominal fluid collection   ESRD on HD TTS   Chronic systolic CHF (congestive heart failure) ([place])   Essential hypertension   Gout   Acute gangrenous cholecystitis s/p open cholcystectomy 05/24/2021   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 05/24/2021   COPD (chronic obstructive pulmonary disease) (HCC)   Chronic pain syndrome   Pneumonia Stable today with current vitals of: BP 95/74, pulse 81, temperature 98.3 F (36.8 C), RR 18, SpO2 96 %.  - POA: Chills with shivering at dialysis center.  Cough, Tachycardic with fever to 100.4 in the ED.  Leukocytosis to 11.  Chest x-ray showing bilateral opacities suspicious for atypical pneumonia versus congestion. - On IV  ceftriaxone, azithromycin,  -  Flu and COVID screen negative  - Given atypical presentation and borderline white count we will also check full respiratory viral - Procal 22 on admission.  - Follow-up blood cultures   Sickle cell anemia - Continue home hydroxyurea and folate - status post transfusion 12/22, recheck Hb in AM   Gout - Continue home  allopurinol   Hypertension - Continue home torsemide   ESRD > On HD TTS.  nephrology following plans repeat HD this PM   Atrial fibrillation - History of this, not currently on rate control medication nor anticoagulation due to hypotension and history of bleeding - went into rapid Afib rate 120s 12/22 this afternoon - discussed with cardiology they are following - likely went into rapid afib due to stress of anemia/hypotension/PNA, seems to be stabilizing as underlying issues are treated   QTc prolongation > History of this.    COPD > History of this in chart, not currently on any inhalers I can see.   Chronic systolic CHF - Last echo was earlier this year with EF 40-45% with indeterminate diastolic function and mildly reduced RV function.  Volume currently controlled with dialysis and torsemide - Continue home torsemide - I's and O's, daily weights   Chronic pain - Continue home oxycodone   Significant history:  in the past year of gallbladder empyema status post cholecystostomy and biliary stenting.  Also in 2022 had small bowel ischemia/SBO status post small bowel resection. - Noted

## 2022-06-10 NOTE — Discharge Summary (Signed)
Physician Discharge Summary   Patient: Patrick [name] MRN: [id number] DOB: 1955-12-17  Admit date:     06/07/2022  Discharge date: 06/10/22  Discharge Physician: Deatra James   PCP: [name], MD   Recommendations at discharge:  Follow-up with your scheduled hemodialysis CBC, BMP with next dialysis on Tuesday, 06/12/2022-results to be reviewed by nephrologist Follow-up with your nephrologist in 1-2 weeks Continue currently recommended antibiotics Monitor your vitals blood pressure daily Follow-up with PCP in 2 weeks follow-up with Dr. Haroldine Laws primary cardiologist in 2-4 weeks  Discharge Diagnoses: Principal Problem:   Pneumonia Active Problems:   Sickle cell anemia (HCC)   Persistent atrial fibrillation (HCC)   Abdominal fluid collection   ESRD on HD TTS   Chronic systolic CHF (congestive heart failure) ([place])   Essential hypertension   Gout   Acute gangrenous cholecystitis s/p open cholcystectomy 05/24/2021   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 05/24/2021   COPD (chronic obstructive pulmonary disease) (HCC)   Chronic pain syndrome  Resolved Problems:   * No resolved hospital problems. *  Hospital Course: Patrick [name] is a 66 y.o. male with medical history significant of anemia, GI bleed, SBO with ischemic bowel status post small bowel resection, gallbladder empyema status post cholecystostomy, status post biliary stent, gout, hypertension, ESRD on HD TTS, A-fib, prolonged QT, COPD, CHF, chronic pain presenting with cough, chills. He was diagnosed with CAP and admitted. Afternoon of 12/22 became more hypotensive with Afib RVR, was given blood 1 unit that AM and 500cc bolus x2 due to hypotension with options for rate control limited by his ESRD, hypotension, etc. Was seen by cardiology and nephrology, transferred to Progressive unit and had short HD session 12/22.     Assessment and Plan: Principal Problem:   Pneumonia Active  Problems:   Sickle cell anemia (HCC)   Persistent atrial fibrillation (HCC) with RVR   Abdominal fluid collection   ESRD on HD TTS   Chronic systolic CHF (congestive heart failure) ([place])   Essential hypertension   Gout   Acute gangrenous cholecystitis s/p open cholcystectomy 05/24/2021   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 05/24/2021   COPD (chronic obstructive pulmonary disease) (HCC)   Chronic pain syndrome   Multifocal pneumonia Stable today with current vitals of: BP 95/74, pulse 81, temperature 98.3 F (36.8 C), RR 18, SpO2 96 %.  - POA: Chills with shivering at dialysis center.  Cough, Tachycardic with fever to 100.4 in the ED.  Leukocytosis to 11.  Chest x-ray showing bilateral opacities suspicious for atypical pneumonia versus congestion. - On IV  ceftriaxone, azithromycin >>> switch to p.o. Levaquin -  Flu and COVID screen negative  - Given atypical presentation and borderline white count we will also check full respiratory viral - Procal 22 on admission.  - Follow-up blood cultures >> no growth to date   Sickle cell anemia - Continue home hydroxyurea and folate - status post transfusion 1 unit PRBC 12/22, -Hemoglobin is 7.1, transfusing 1 more unit PRBC today 06/10/2022   Gout - Continue home allopurinol   Hypertension - Continue home torsemide   ESRD > On HD TTS.  nephrology following plans repeat HD .Marland Kitchen  Next hemodialysis scheduled 06/12/2022   Atrial fibrillation - History of this, not currently on rate control medication nor anticoagulation due to hypotension and history of bleeding -Patient was seen and evaluated by cardiologist on this admission no significant changes to medication--still not recommending chronic  anticoagulation due to history of bleed, sickle cell disease, anemia.Marland Kitchen    - went into rapid Afib rate 120s 12/22  - discussed with cardiology they are following - likely went into rapid afib due to stress of anemia/hypotension/PNA,  seems to be stabilizing as underlying issues are treated  -Patient is to follow-up with Dr. Haroldine Laws primary cardiologist    QTc prolongation > History of this.    COPD > History of this in chart, not currently on any inhalers I can see.   Chronic systolic CHF - Last echo was earlier this year with EF 40-45% with indeterminate diastolic function and mildly reduced RV function.  -Repeat echo on this admission revealing ejection fraction of 35-40% - Volume currently controlled with dialysis and torsemide -His torsemide was switched by nephrology to 80 mg p.o. daily - Continue home torsemide -Monitoring daily weight, continue hemodialysis  -Follow-up with a primary cardiologist Dr. Haroldine Laws   Chronic pain - Continue home oxycodone   Significant history:  in the past year of gallbladder empyema status post cholecystostomy and biliary stenting.  Also in 2022 had small bowel ischemia/SBO status post small bowel resection. - Noted  Assessment and Plan: No notes have been filed under this hospital service. Service: Hospitalist      Consultants: Cardiology/nephrology Procedures performed: 2D echocardiogram Disposition: Home Diet recommendation:  Discharge Diet Orders (From admission, onward)     Start     Ordered   06/10/22 0000  Diet - low sodium heart healthy        06/10/22 1044           Renal diet DISCHARGE MEDICATION: Allergies as of 06/10/2022   No Known Allergies      Medication List     STOP taking these medications    benzonatate 100 MG capsule Commonly known as: TESSALON       TAKE these medications    acetaminophen 500 MG tablet Commonly known as: TYLENOL Take 1,500 mg by mouth 3 (three) times daily as needed for mild pain, moderate pain, fever or headache.   allopurinol 100 MG tablet Commonly known as: ZYLOPRIM Take 1 tablet (100 mg total) by mouth daily.   Auryxia 1 GM 210 MG(Fe) tablet Generic drug: ferric citrate Take 210-420 mg  by mouth See admin instructions. 420 mg with every meal, 210 mg with each snack.   cinacalcet 30 MG tablet Commonly known as: SENSIPAR Take 1 tablet (30 mg total) by mouth Every Tuesday,Thursday,and Saturday with dialysis. Start taking on: June 12, 2022   diphenhydramine-acetaminophen 25-500 MG Tabs tablet Commonly known as: TYLENOL PM Take 2 tablets by mouth at bedtime as needed (sleep/pain).   folic acid 1 MG tablet Commonly known as: FOLVITE Take 1 tablet (1 mg total) by mouth daily.   GERITOL PO Take 15 mLs by mouth daily.   hydroxyurea 500 MG capsule Commonly known as: HYDREA Take 1 capsule (500 mg total) by mouth daily.   levofloxacin 500 MG tablet Commonly known as: Levaquin Take 1 tablet (500 mg total) by mouth daily for 5 days.   lidocaine-prilocaine cream Commonly known as: EMLA Apply 1 application. topically Every Tuesday,Thursday,and Saturday with dialysis.   midodrine 2.5 MG tablet Commonly known as: PROAMATINE Take 1 tablet (2.5 mg total) by mouth 3 (three) times daily with meals.   Oxycodone HCl 10 MG Tabs Take 1 tablet (10 mg total) by mouth every 6 (six) hours as needed. What changed: reasons to take this   pantoprazole 40 MG tablet  Commonly known as: PROTONIX Take 1 tablet (40 mg total) by mouth daily.   tiZANidine 4 MG tablet Commonly known as: ZANAFLEX TAKE 1/2 TABLET (2MG  TOTAL) BY MOUTH EVERY 8 HOURS AS NEEDED FOR MUSCLE SPASM What changed:  how much to take how to take this when to take this reasons to take this additional instructions   Torsemide 40 MG Tabs Take 80 mg by mouth daily. Start taking on: June 11, 2022 What changed:  medication strength how much to take when to take this   Vitamin D (Ergocalciferol) 1.25 MG (50000 UNIT) Caps capsule Commonly known as: DRISDOL Take 1 capsule (50,000 Units total) by mouth once a week. Monday What changed:  when to take this additional instructions        Discharge  Exam: Filed Weights   06/09/22 1332 06/09/22 1629 06/10/22 0354  Weight: 61.3 kg 60.3 kg 60.2 kg      Physical Exam:   General:  AAO x 3,  cooperative, no distress;   HEENT:  Legally blind, normocephalic, PERRL, otherwise with in Normal limits   Neuro:  CNII-XII intact. , normal motor and sensation, reflexes intact   Lungs:   Clear to auscultation BL, Respirations unlabored,  No wheezes / crackles  Cardio:    S1/S2, RRR, No murmure, No Rubs or Gallops   Abdomen:  Soft, non-tender, bowel sounds active all four quadrants, no guarding or peritoneal signs.  Muscular  skeletal:  Limited exam -global generalized weaknesses - in bed, able to move all 4 extremities,   2+ pulses,  symmetric, No pitting edema  Skin:  Dry, warm to touch, negative for any Rashes,  Wounds: Please see nursing documentation          Condition at discharge: good  The results of significant diagnostics from this hospitalization (including imaging, microbiology, ancillary and laboratory) are listed below for reference.   Imaging Studies: ECHOCARDIOGRAM COMPLETE  Result Date: 06/08/2022    ECHOCARDIOGRAM REPORT   Patient Name:   JERMON CHALFANT Date of Exam: 06/08/2022 Medical Rec #:  [id number]           Height:       71.0 in Accession #:    [id number]          Weight:       130.0 lb Date of Birth:  1955/06/22           BSA:          1.756 m Patient Age:    75 years            BP:           92/73 mmHg Patient Gender: M                   HR:           111 bpm. Exam Location:  Inpatient Procedure: 2D Echo, Cardiac Doppler and Color Doppler Indications:    Atrial fibrillation  History:        Patient has prior history of Echocardiogram examinations, most                 recent 04/02/2022. CHF, COPD, Arrythmias:Atrial Fibrillation;                 Risk Factors:Hypertension and Former Smoker. ESRD.  Sonographer:    Clayton Lefort RDCS (AE) Referring Phys: [id number] M[facility]  Sonographer Comments:  Technically difficult study due to poor echo windows. IMPRESSIONS  1. Left  ventricular ejection fraction, by estimation, is 35 to 40%. The left ventricle has moderately decreased function. The left ventricle demonstrates global hypokinesis. There is moderate concentric left ventricular hypertrophy. Left ventricular diastolic parameters are indeterminate.  2. Right ventricular systolic function is mildly reduced. The right ventricular size is moderately enlarged. There is severely elevated pulmonary artery systolic pressure.  3. Left atrial size was severely dilated.  4. Right atrial size was severely dilated.  5. The mitral valve is degenerative. Moderate mitral valve regurgitation.  6. Tricuspid valve regurgitation is moderate to severe.  7. Aortic valve regurgitation is not visualized. Aortic valve sclerosis/calcification is present, without any evidence of aortic stenosis.  8. There is mild dilatation of the aortic root, measuring 38 mm.  9. The inferior vena cava is dilated in size with <50% respiratory variability, suggesting right atrial pressure of 15 mmHg. Comparison(s): No significant change from prior study. FINDINGS  Left Ventricle: Left ventricular ejection fraction, by estimation, is 35 to 40%. The left ventricle has moderately decreased function. The left ventricle demonstrates global hypokinesis. The left ventricular internal cavity size was normal in size. There is moderate concentric left ventricular hypertrophy. Left ventricular diastolic parameters are indeterminate. Right Ventricle: The right ventricular size is moderately enlarged. Right ventricular systolic function is mildly reduced. There is severely elevated pulmonary artery systolic pressure. The tricuspid regurgitant velocity is 4.21 m/s, and with an assumed right atrial pressure of 15 mmHg, the estimated right ventricular systolic pressure is 56.8 mmHg. Left Atrium: Left atrial size was severely dilated. Right Atrium: Right atrial size  was severely dilated. Pericardium: Trivial pericardial effusion is present. Mitral Valve: The mitral valve is degenerative in appearance. Moderate mitral valve regurgitation. Tricuspid Valve: Tricuspid valve regurgitation is moderate to severe. Aortic Valve: Aortic valve regurgitation is not visualized. Aortic valve sclerosis/calcification is present, without any evidence of aortic stenosis. Aortic valve mean gradient measures 3.0 mmHg. Aortic valve peak gradient measures 4.8 mmHg. Aortic valve  area, by VTI measures 2.58 cm. Pulmonic Valve: Pulmonic valve regurgitation is not visualized. Aorta: There is mild dilatation of the aortic root, measuring 38 mm. Venous: The inferior vena cava is dilated in size with less than 50% respiratory variability, suggesting right atrial pressure of 15 mmHg. IAS/Shunts: No atrial level shunt detected by color flow Doppler.  LEFT VENTRICLE PLAX 2D LVIDd:         5.60 cm LVIDs:         4.00 cm LV PW:         1.50 cm LV IVS:        1.70 cm LVOT diam:     2.20 cm LV SV:         45 LV SV Index:   26 LVOT Area:     3.80 cm  LV Volumes (MOD) LV vol d, MOD A2C: 97.5 ml LV vol d, MOD A4C: 196.0 ml LV vol s, MOD A2C: 81.2 ml LV vol s, MOD A4C: 136.0 ml LV SV MOD A2C:     16.3 ml LV SV MOD A4C:     196.0 ml LV SV MOD BP:      42.5 ml RIGHT VENTRICLE            IVC RV Basal diam:  5.60 cm    IVC diam: 3.40 cm RV Mid diam:    4.50 cm RV S prime:     9.03 cm/s TAPSE (M-mode): 1.6 cm LEFT ATRIUM  Index        RIGHT ATRIUM           Index LA diam:      6.00 cm  3.42 cm/m   RA Area:     36.80 cm LA Vol (A2C): 82.0 ml  46.70 ml/m  RA Volume:   140.00 ml 79.74 ml/m LA Vol (A4C): 164.0 ml 93.40 ml/m  AORTIC VALVE AV Area (Vmax):    2.93 cm AV Area (Vmean):   2.54 cm AV Area (VTI):     2.58 cm AV Vmax:           110.00 cm/s AV Vmean:          87.300 cm/s AV VTI:            0.174 m AV Peak Grad:      4.8 mmHg AV Mean Grad:      3.0 mmHg LVOT Vmax:         84.80 cm/s LVOT Vmean:         58.300 cm/s LVOT VTI:          0.118 m LVOT/AV VTI ratio: 0.68  AORTA Ao Root diam: 3.80 cm MR Peak grad:    51.0 mmHg    TRICUSPID VALVE MR Mean grad:    38.0 mmHg    TR Peak grad:   70.9 mmHg MR Vmax:         357.00 cm/s  TR Vmax:        421.00 cm/s MR Vmean:        296.0 cm/s MR PISA:         3.08 cm     SHUNTS MR PISA Eff ROA: 33 mm       Systemic VTI:  0.12 m MR PISA Radius:  0.70 cm      Systemic Diam: 2.20 cm Phineas Inches Electronically signed by Phineas Inches Signature Date/Time: 06/08/2022/3:43:40 PM    Final    DG Chest 2 View  Result Date: 06/07/2022 CLINICAL DATA:  Cough EXAM: CHEST - 2 VIEW COMPARISON:  06/04/22 CXR FINDINGS: No pleural effusion. No pneumothorax. Unchanged cardiac and mediastinal contours. Cardiac contours are likely at the upper limits of normal. Aortic atherosclerotic calcifications. The prominent bilateral interstitial opacities, which are nonspecific, and could represent pulmonary venous congestion or atypical infection. Visualized upper abdomen is unremarkable. Vertebral body heights are maintained. IMPRESSION: Prominent bilateral interstitial opacities, which are nonspecific, and could represent pulmonary venous congestion or atypical infection. Electronically Signed   By: Marin Roberts M.D.   On: 06/07/2022 09:33   CT ABDOMEN PELVIS W CONTRAST  Result Date: 06/04/2022 CLINICAL DATA:  Abdominal pain, postop. Sickle cell crisis and back pain with knee pain in generalized pain. Cholecystectomy 05/17/2022 EXAM: CT ABDOMEN AND PELVIS WITH CONTRAST TECHNIQUE: Multidetector CT imaging of the abdomen and pelvis was performed using the standard protocol following bolus administration of intravenous contrast. RADIATION DOSE REDUCTION: This exam was performed according to the departmental dose-optimization program which includes automated exposure control, adjustment of the mA and/or kV according to patient size and/or use of iterative reconstruction technique. CONTRAST:  58mL  OMNIPAQUE IOHEXOL 300 MG/ML  SOLN COMPARISON:  04/20/2022 FINDINGS: Lower chest: Small but improved right pleural effusion. Minimal bibasilar atelectasis. Tiny amount left pleural. Stable cardiomegaly. Hepatobiliary: Minimal stable pneumobilia over the central left lobe and evidence of patient's recent cholecystectomy. Biliary tree is otherwise unchanged. Pancreas: Normal. Spleen: Spleen not visualized. Adrenals/Urinary Tract: Adrenal glands are normal and symmetric. Kidneys demonstrate  no hydronephrosis or nephrolithiasis. There are several bilateral subcentimeter renal cortical hypodensities too small to characterize but likely cysts and unchanged. No further imaging follow-up needed. Ureters and bladder are normal. Stomach/Bowel: Stomach and small bowel are within normal. Surgical suture line is present over the region of the cecum appendix is not visualized. Remainder of the colon is unremarkable. Vascular/Lymphatic: Mild calcified plaque over the abdominal aorta which is normal in caliber. No evidence of adenopathy. Reproductive: Normal. Other: Continued interval decrease in size of a focal fluid collection over the left upper quadrant now measuring 1.4 x 3 cm. Worsening fluid over the left mid to upper abdomen. Overall ascites not significantly changed. Musculoskeletal: Diffuse changes throughout the spine likely due to renal osteodystrophy. No focal abnormality. IMPRESSION: 1. No acute findings in the abdomen/pelvis. 2. Continued interval decrease in size of a focal fluid collection over the left upper quadrant now measuring 1.4 x 3 cm. Worsening fluid over the left mid to upper abdomen. Overall ascites not significantly changed. 3. Small but improved right pleural effusion. Minimal bibasilar atelectasis. Tiny amount left pleural effusion. 4. Several bilateral subcentimeter renal cortical hypodensities too small to characterize, but likely cysts and unchanged. No further imaging follow-up needed. 5. Aortic  atherosclerosis. Aortic Atherosclerosis (ICD10-I70.0). Electronically Signed   By: Marin Olp M.D.   On: 06/04/2022 12:13   DG Chest 2 View  Result Date: 06/04/2022 CLINICAL DATA:  Sickle cell crisis. Back pain. Knee pain and diffuse generalized pain throughout body. EXAM: CHEST - 2 VIEW COMPARISON:  03/31/2022 FINDINGS: Enlarged, stable in configuration. Lungs are free of focal consolidations and pleural effusions. There are patchy sclerotic changes throughout the ribs, stable in appearance. IMPRESSION: Stable cardiomegaly. Electronically Signed   By: Nolon Nations M.D.   On: 06/04/2022 10:29   DG ERCP  Result Date: 05/17/2022 CLINICAL DATA:  History of biliary leak and stent placement. EXAM: ERCP TECHNIQUE: Multiple spot images obtained with the fluoroscopic device and submitted for interpretation post-procedure. FLUOROSCOPY: Radiation Exposure Index (as provided by the fluoroscopic device): 28.92 mGy Kerma COMPARISON:  ERCP 04/04/2022 FINDINGS: The biliary stent was removed. Biliary system was cannulated and opacified with contrast. Balloon sweep was performed. Status post cholecystectomy and opacification of the cystic duct stump. No evidence for contrast extravasation or leak. Filling defects in the biliary system likely related to gas bubbles. No definite stones. IMPRESSION: 1. Removal of biliary stent. 2. Cannulation of biliary system and balloon sweep. These images were submitted for radiologic interpretation only. Please see the procedural report for the amount of contrast and the fluoroscopy time utilized. Electronically Signed   By: Markus Daft M.D.   On: 05/17/2022 15:04    Microbiology: Results for orders placed or performed during the hospital encounter of 06/07/22  Resp panel by RT-PCR (RSV, Flu A&B, Covid) Anterior Nasal Swab     Status: None   Collection Time: 06/07/22  9:06 AM   Specimen: Anterior Nasal Swab  Result Value Ref Range Status   SARS Coronavirus 2 by RT PCR  NEGATIVE NEGATIVE Final    Comment: (NOTE) SARS-CoV-2 target nucleic acids are NOT DETECTED.  The SARS-CoV-2 RNA is generally detectable in upper respiratory specimens during the acute phase of infection. The lowest concentration of SARS-CoV-2 viral copies this assay can detect is 138 copies/mL. A negative result does not preclude SARS-Cov-2 infection and should not be used as the sole basis for treatment or other patient management decisions. A negative result may occur with  improper specimen collection/handling,  submission of specimen other than nasopharyngeal swab, presence of viral mutation(s) within the areas targeted by this assay, and inadequate number of viral copies(<138 copies/mL). A negative result must be combined with clinical observations, patient history, and epidemiological information. The expected result is Negative.  Fact Sheet for Patients:  EntrepreneurPulse.com.au  Fact Sheet for Healthcare Providers:  IncredibleEmployment.be  This test is no t yet approved or cleared by the [place] FDA and  has been authorized for detection and/or diagnosis of SARS-CoV-2 by FDA under an Emergency Use Authorization (EUA). This EUA will remain  in effect (meaning this test can be used) for the duration of the COVID-19 declaration under Section 564(b)(1) of the Act, 21 U.S.C.section 360bbb-3(b)(1), unless the authorization is terminated  or revoked sooner.       Influenza A by PCR NEGATIVE NEGATIVE Final   Influenza B by PCR NEGATIVE NEGATIVE Final    Comment: (NOTE) The Xpert Xpress SARS-CoV-2/FLU/RSV plus assay is intended as an aid in the diagnosis of influenza from Nasopharyngeal swab specimens and should not be used as a sole basis for treatment. Nasal washings and aspirates are unacceptable for Xpert Xpress SARS-CoV-2/FLU/RSV testing.  Fact Sheet for Patients: EntrepreneurPulse.com.au  Fact Sheet for  Healthcare Providers: IncredibleEmployment.be  This test is not yet approved or cleared by the [place] FDA and has been authorized for detection and/or diagnosis of SARS-CoV-2 by FDA under an Emergency Use Authorization (EUA). This EUA will remain in effect (meaning this test can be used) for the duration of the COVID-19 declaration under Section 564(b)(1) of the Act, 21 U.S.C. section 360bbb-3(b)(1), unless the authorization is terminated or revoked.     Resp Syncytial Virus by PCR NEGATIVE NEGATIVE Final    Comment: (NOTE) Fact Sheet for Patients: EntrepreneurPulse.com.au  Fact Sheet for Healthcare Providers: IncredibleEmployment.be  This test is not yet approved or cleared by the [place] FDA and has been authorized for detection and/or diagnosis of SARS-CoV-2 by FDA under an Emergency Use Authorization (EUA). This EUA will remain in effect (meaning this test can be used) for the duration of the COVID-19 declaration under Section 564(b)(1) of the Act, 21 U.S.C. section 360bbb-3(b)(1), unless the authorization is terminated or revoked.  Performed at [facility], [place] [address] [place], [place] [address]   Culture, blood (routine x 2)     Status: None (Preliminary result)   Collection Time: 06/07/22 11:52 AM   Specimen: BLOOD LEFT FOREARM  Result Value Ref Range Status   Specimen Description BLOOD LEFT FOREARM  Final   Special Requests   Final    BOTTLES DRAWN AEROBIC AND ANAEROBIC Blood Culture results may not be optimal due to an excessive volume of blood received in culture bottles   Culture   Final    NO GROWTH 3 DAYS Performed at [facility], [place] [address] [place], [place] [address]    Report Status PENDING  Incomplete  Culture, blood (routine x 2)     Status: None (Preliminary result)   Collection Time: 06/07/22  1:08 PM   Specimen: BLOOD LEFT WRIST  Result Value Ref Range Status    Specimen Description BLOOD LEFT WRIST  Final   Special Requests   Final    BOTTLES DRAWN AEROBIC AND ANAEROBIC Blood Culture adequate volume   Culture   Final    NO GROWTH 3 DAYS Performed at [facility], [place] [address] [place], [place] [address]    Report Status PENDING  Incomplete   *  Note: Due to a large number of results and/or encounters for the requested time period, some results have not been displayed. A complete set of results can be found in Results Review.    Labs: CBC: Recent Labs  Lab 06/04/22 1013 06/07/22 0906 06/08/22 0124 06/08/22 1252 06/09/22 0930  WBC 7.6 11.0* 16.0*  --  11.9*  NEUTROABS 5.7 9.1*  --   --   --   HGB 7.4* 7.4* 6.8* 7.3* 7.1*  HCT 20.2* 20.0* 17.6* 19.9* 19.6*  MCV 104.7* 101.0* 98.3  --  99.0  PLT 191 157 158  --  553   Basic Metabolic Panel: Recent Labs  Lab 06/04/22 1013 06/07/22 0906 06/08/22 0124 06/08/22 1252 06/09/22 0930  NA 131* 135 136  --  135  K 3.9 3.8 3.6  --  3.5  CL 86* 91* 92*  --  95*  CO2 23 26 27   --  28  GLUCOSE 85 60* 101*  --  116*  BUN 42* 28* 39*  --  22  CREATININE 6.91* 4.71* 6.12*  --  4.22*  CALCIUM 8.2* 7.5* 7.8*  --  8.1*  MG  --   --   --  2.0  --   PHOS  --   --   --   --  3.3   Liver Function Tests: Recent Labs  Lab 06/04/22 1013 06/08/22 0124 06/09/22 0930  AST 22 27  --   ALT 13 14  --   ALKPHOS 273* 225*  --   BILITOT 1.4* 1.6*  --   PROT 7.9 6.6  --   ALBUMIN 4.0 2.7* 2.7*   CBG: No results for input(s): "GLUCAP" in the last 168 hours.  Discharge time spent: greater than 40 minutes.  Signed: Deatra James, MD [facility] 06/10/2022

## 2022-06-11 LAB — BPAM RBC
Blood Product Expiration Date: 202401102359
Blood Product Expiration Date: 202401162359
ISSUE DATE / TIME: 202312220610
ISSUE DATE / TIME: 202312241038
Unit Type and Rh: 6200
Unit Type and Rh: 6200

## 2022-06-11 LAB — TYPE AND SCREEN
ABO/RH(D): AB POS
Antibody Screen: NEGATIVE
Unit division: 0
Unit division: 0

## 2022-06-12 ENCOUNTER — Telehealth: Payer: Self-pay | Admitting: Nurse Practitioner

## 2022-06-12 LAB — CULTURE, BLOOD (ROUTINE X 2)
Culture: NO GROWTH
Culture: NO GROWTH
Special Requests: ADEQUATE

## 2022-06-12 LAB — HEPATITIS B SURFACE ANTIBODY, QUANTITATIVE: Hep B S AB Quant (Post): 227.7 m[IU]/mL (ref >9.9)

## 2022-06-12 NOTE — Telephone Encounter (Signed)
Transition of care contact from inpatient facility  Date of Discharge: 06/10/2022 Date of Contact: 06/12/2022 Method of contact: Phone  Attempted to contact patient to discuss transition of care from inpatient admission. Patient did not answer the phone. Message was left on the patient's voicemail with call back number [phone].

## 2022-06-13 ENCOUNTER — Telehealth: Payer: Self-pay | Admitting: *Deleted

## 2022-06-13 ENCOUNTER — Encounter: Payer: Self-pay | Admitting: *Deleted

## 2022-06-13 NOTE — Patient Outreach (Signed)
Care Coordination [facility] Note Transition Care Management Follow-up Telephone Call Date of discharge and from where: Sunday, 06/10/22 [place]; pneumonia; sickle cell pain; ESRD on hemodialysis; A-Fib How have you been since you were released from the hospital? "I am doing okay; I will see either the nurse practitioner or the doctor at hemodialysis tomorrow when I go for my regular session.  I am back to my normal self; I live by myself, but I have a couple of cousins that help me if I need anything.  I am very independent.  I have been doing this dialysis for about 4 years now, so I got it down to a routine now.  I don't like having to be on dialysis, but it just is what it is.  I really don't think I need any help with making these appointments-- I have already left messages for the cardiologist and for the PCP-- I am sure they will call me soon, but I will call you back if I don't hear from them soon.  I don't need any nurse calling me on a regular basis because at dialysis they have nurses, social workers, and all kinds of people to help with whatever I need, I use them if anything comes up that I need.  I also stay in touch with my health insurance provider too.  I am doing okay really."  Any questions or concerns? No  Items Reviewed: Did the pt receive and understand the discharge instructions provided? Yes  Medications obtained and verified? Yes  verified obtained and is taking antibiotics and new medications as prescribed; patient self-manages medications and has assistance if needed from his extended family Other? No  Any new allergies since your discharge? No  Dietary orders reviewed? Yes Do you have support at home? Yes  patient resides alone and has assistance with any care needs from extended family/ cousins; he reports being independent in self-care  Home Care and Equipment/Supplies: Were home health services ordered? no If so, what is the name of the agency? N/A  Has the agency set up  a time to come to the patient's home? not applicable Were any new equipment or medical supplies ordered?  No What is the name of the medical supply agency? N/A Were you able to get the supplies/equipment? not applicable Do you have any questions related to the use of the equipment or supplies? No N/A  Functional Questionnaire: (I = Independent and D = Dependent) ADLs: I  Bathing/Dressing- I  Meal Prep- I  Eating- I  Maintaining continence- I  Transferring/Ambulation- I  Managing Meds- I  Follow up appointments reviewed:  PCP Hospital f/u appt confirmed? No  Scheduled to see - on - @ [facility] f/u appt confirmed?  Patient states he will see nephrology NP or MD at hemodialysis session tomorrow 06/14/22  Scheduled to see - on - @ - Are transportation arrangements needed? No  If their condition worsens, is the pt aware to call PCP or go to the Emergency Dept.? Yes Was the patient provided with contact information for the PCP's office or ED? No- patient declines; reports already has contact information for all care providers; confirms he has already called PCPP and cardiology providers and left voice messages for call-back: he declined my offers to assist with scheduling post-hospital provider visits  Was to pt encouraged to call back with questions or concerns? Yes- provided my direct phone number should concerns, needs/ problems arise in the future  SDOH assessments and  interventions completed:   Yes SDOH Interventions Today    Flowsheet Row Most Recent Value  SDOH Interventions   Food Insecurity Interventions Intervention Not Indicated  Transportation Interventions Intervention Not Indicated  [uses [facility],  family also assists as indicated]      Care Coordination Interventions:  Provided my direct phone number for any concerns in the future as patient declined ongoing RN CM Care Coordination outreach    Encounter Outcome:  Pt. Visit Completed    Oneta Rack, RN, BSN, CCRN Alumnus RN CM Care Coordination/ Transition of [place] Management [phone]: direct office

## 2022-06-19 DIAGNOSIS — D5700 Hb-SS disease with crisis, unspecified: Secondary | ICD-10-CM | POA: Diagnosis not present

## 2022-06-19 DIAGNOSIS — D57819 Other sickle-cell disorders with crisis, unspecified: Secondary | ICD-10-CM | POA: Diagnosis not present

## 2022-06-19 DIAGNOSIS — Z992 Dependence on renal dialysis: Secondary | ICD-10-CM | POA: Diagnosis not present

## 2022-06-19 DIAGNOSIS — D57219 Sickle-cell/Hb-C disease with crisis, unspecified: Secondary | ICD-10-CM | POA: Diagnosis not present

## 2022-06-19 DIAGNOSIS — D509 Iron deficiency anemia, unspecified: Secondary | ICD-10-CM | POA: Diagnosis not present

## 2022-06-19 DIAGNOSIS — D631 Anemia in chronic kidney disease: Secondary | ICD-10-CM | POA: Diagnosis not present

## 2022-06-19 DIAGNOSIS — N186 End stage renal disease: Secondary | ICD-10-CM | POA: Diagnosis not present

## 2022-06-19 DIAGNOSIS — N2581 Secondary hyperparathyroidism of renal origin: Secondary | ICD-10-CM | POA: Diagnosis not present

## 2022-06-19 DIAGNOSIS — D571 Sickle-cell disease without crisis: Secondary | ICD-10-CM | POA: Diagnosis not present

## 2022-06-22 DIAGNOSIS — N186 End stage renal disease: Secondary | ICD-10-CM | POA: Diagnosis not present

## 2022-06-22 DIAGNOSIS — J189 Pneumonia, unspecified organism: Secondary | ICD-10-CM | POA: Diagnosis not present

## 2022-06-22 DIAGNOSIS — Z992 Dependence on renal dialysis: Secondary | ICD-10-CM | POA: Diagnosis not present

## 2022-06-22 DIAGNOSIS — Z681 Body mass index (BMI) 19 or less, adult: Secondary | ICD-10-CM | POA: Diagnosis not present

## 2022-06-22 DIAGNOSIS — E441 Mild protein-calorie malnutrition: Secondary | ICD-10-CM | POA: Diagnosis not present

## 2022-06-22 DIAGNOSIS — Z515 Encounter for palliative care: Secondary | ICD-10-CM | POA: Diagnosis not present

## 2022-06-22 DIAGNOSIS — J449 Chronic obstructive pulmonary disease, unspecified: Secondary | ICD-10-CM | POA: Diagnosis not present

## 2022-06-26 DIAGNOSIS — D57819 Other sickle-cell disorders with crisis, unspecified: Secondary | ICD-10-CM | POA: Diagnosis not present

## 2022-06-26 DIAGNOSIS — D631 Anemia in chronic kidney disease: Secondary | ICD-10-CM | POA: Diagnosis not present

## 2022-06-26 DIAGNOSIS — N186 End stage renal disease: Secondary | ICD-10-CM | POA: Diagnosis not present

## 2022-06-26 DIAGNOSIS — D57219 Sickle-cell/Hb-C disease with crisis, unspecified: Secondary | ICD-10-CM | POA: Diagnosis not present

## 2022-06-26 DIAGNOSIS — D5700 Hb-SS disease with crisis, unspecified: Secondary | ICD-10-CM | POA: Diagnosis not present

## 2022-06-26 DIAGNOSIS — D509 Iron deficiency anemia, unspecified: Secondary | ICD-10-CM | POA: Diagnosis not present

## 2022-06-26 DIAGNOSIS — D571 Sickle-cell disease without crisis: Secondary | ICD-10-CM | POA: Diagnosis not present

## 2022-06-26 DIAGNOSIS — Z992 Dependence on renal dialysis: Secondary | ICD-10-CM | POA: Diagnosis not present

## 2022-06-26 DIAGNOSIS — N2581 Secondary hyperparathyroidism of renal origin: Secondary | ICD-10-CM | POA: Diagnosis not present

## 2022-06-27 ENCOUNTER — Other Ambulatory Visit: Payer: Self-pay

## 2022-06-27 DIAGNOSIS — D571 Sickle-cell disease without crisis: Secondary | ICD-10-CM

## 2022-06-27 MED ORDER — OXYCODONE HCL 10 MG PO TABS: 10.0000 mg | ORAL_TABLET | Freq: Four times a day (QID) | ORAL | 0 refills | Status: DC | PRN

## 2022-06-27 NOTE — Telephone Encounter (Signed)
Also sent to Dr. Christella Noa . Please advise [facility]

## 2022-06-27 NOTE — Telephone Encounter (Signed)
Pt called and requested to have Oxy refilled. Please advise Kh

## 2022-06-28 ENCOUNTER — Encounter: Payer: Self-pay | Admitting: Vascular Surgery

## 2022-06-28 ENCOUNTER — Ambulatory Visit (INDEPENDENT_AMBULATORY_CARE_PROVIDER_SITE_OTHER): Payer: PPO | Admitting: Vascular Surgery

## 2022-06-28 VITALS — BP 101/62 | HR 88 | Temp 98.7°F | Resp 20 | Ht 71.0 in | Wt 142.0 lb

## 2022-06-28 DIAGNOSIS — N186 End stage renal disease: Secondary | ICD-10-CM | POA: Diagnosis not present

## 2022-06-28 DIAGNOSIS — Z992 Dependence on renal dialysis: Secondary | ICD-10-CM

## 2022-06-28 NOTE — Progress Notes (Signed)
REASON FOR VISIT:   To evaluate ulcer overlying right arm fistula.  MEDICAL ISSUES:   END-STAGE RENAL DISEASE: This patient presents with 2 ulcerations overlying his right arm fistula as documented in the photograph below.  I think we can excise these ulcers and they should still have room to stick the graft above and below these 2 areas.  Thus I do not think she will need a catheter.  He dialyzes on Tuesdays Thursdays and Saturdays I have scheduled this on Monday (07/09/2022).  We have discussed the indications for the procedure and the potential complications and he is agreeable to proceed.  HPI:   Patrick [name] is a pleasant 67 y.o. male who has a left upper arm fistula.  He states this was placed about 8 years ago.  He has developed 2 areas overlying the fistula that were concerning and he was sent for evaluation.  Of note he dialyzes on Tuesdays Thursdays and Saturdays.  Past Medical History:  Diagnosis Date   A-fib ([place])    Blood dyscrasia    Blood transfusion    "I've had a bunch of them"   CHF (congestive heart failure) ([place])    followed by hf clinic   Chronic kidney disease    Dialysis patient ([place]) 04/2019   AV fistula (right arm), TTS   Dysrhythmia    A-Fib per patient   Early satiety    Elevated LFTs 04/01/2022   Elevated troponin 04/01/2022   Empyema of gallbladder 05/27/2021   Gout    Headache(784.0)    "probably weekly" (01/13/2014)   Legally blind    Metabolic acidosis with increased anion gap and accumulation of organic acids 04/01/2022   Mitral regurgitation    Muscle tightness-Right arm  05/27/2014   Pulmonary hypertension (HCC)    Shortness of breath dyspnea    Sickle cell disease, type SS (HCC)    Swelling abdomen    Swelling of extremity, left    Swelling of extremity, right    Tricuspid regurgitation     Family History  Problem Relation Age of Onset   Alcohol abuse Mother    Liver disease Mother    Cirrhosis Mother    Heart attack  Mother     SOCIAL HISTORY: Social History   Tobacco Use   Smoking status: Former    Packs/day: 0.50    Years: 41.00    Total pack years: 20.50    Types: Cigarettes    Quit date: 12/08/2015    Years since quitting: 6.5   Smokeless tobacco: Never  Substance Use Topics   Alcohol use: Not Currently    Alcohol/week: 3.0 standard drinks of alcohol    Types: 1 Glasses of wine, 2 Cans of beer per week    Comment: none since starting dialysis 06/2021    No Known Allergies  Current Outpatient Medications  Medication Sig Dispense Refill   acetaminophen (TYLENOL) 500 MG tablet Take 1,500 mg by mouth 3 (three) times daily as needed for mild pain, moderate pain, fever or headache.     allopurinol (ZYLOPRIM) 100 MG tablet Take 1 tablet (100 mg total) by mouth daily. 30 tablet 1   AURYXIA 1 GM 210 MG(Fe) tablet Take 210-420 mg by mouth See admin instructions. 420 mg with every meal, 210 mg with each snack.     cinacalcet (SENSIPAR) 30 MG tablet Take 1 tablet (30 mg total) by mouth Every Tuesday,Thursday,and Saturday with dialysis. 60 tablet 0   diphenhydramine-acetaminophen (TYLENOL PM)  25-500 MG TABS tablet Take 2 tablets by mouth at bedtime as needed (sleep/pain).     folic acid (FOLVITE) 1 MG tablet Take 1 tablet (1 mg total) by mouth daily. 30 tablet 1   hydroxyurea (HYDREA) 500 MG capsule Take 1 capsule (500 mg total) by mouth daily. 90 capsule 1   Iron-Vitamins (GERITOL PO) Take 15 mLs by mouth daily.     lidocaine-prilocaine (EMLA) cream Apply 1 application. topically Every Tuesday,Thursday,and Saturday with dialysis.     midodrine (PROAMATINE) 2.5 MG tablet Take 1 tablet (2.5 mg total) by mouth 3 (three) times daily with meals. 90 tablet 0   Oxycodone HCl 10 MG TABS Take 1 tablet (10 mg total) by mouth every 6 (six) hours as needed. 60 tablet 0   pantoprazole (PROTONIX) 40 MG tablet Take 1 tablet (40 mg total) by mouth daily. 180 tablet 1   tiZANidine (ZANAFLEX) 4 MG tablet TAKE 1/2  TABLET (2MG  TOTAL) BY MOUTH EVERY 8 HOURS AS NEEDED FOR MUSCLE SPASM (Patient taking differently: Take 2 mg by mouth 3 (three) times daily as needed for muscle spasms.) 30 tablet 2   torsemide 40 MG TABS Take 80 mg by mouth daily. 30 tablet 0   Vitamin D, Ergocalciferol, (DRISDOL) 1.25 MG (50000 UNIT) CAPS capsule Take 1 capsule (50,000 Units total) by mouth once a week. Monday (Patient taking differently: Take 50,000 Units by mouth every Monday.) 5 capsule 0   No current facility-administered medications for this visit.    REVIEW OF SYSTEMS:  [X]  denotes positive finding, [ ]  denotes negative finding Cardiac  Comments:  Chest pain or chest pressure:    Shortness of breath upon exertion:    Short of breath when lying flat:    Irregular heart rhythm:        Vascular    Pain in calf, thigh, or hip brought on by ambulation:    Pain in feet at night that wakes you up from your sleep:     Blood clot in your veins:    Leg swelling:         Pulmonary    Oxygen at home:    Productive cough:     Wheezing:         Neurologic    Sudden weakness in arms or legs:     Sudden numbness in arms or legs:     Sudden onset of difficulty speaking or slurred speech:    Temporary loss of vision in one eye:     Problems with dizziness:         Gastrointestinal    Blood in stool:     Vomited blood:         Genitourinary    Burning when urinating:     Blood in urine:        Psychiatric    Major depression:         Hematologic    Bleeding problems:    Problems with blood clotting too easily:        Skin    Rashes or ulcers:        Constitutional    Fever or chills:     PHYSICAL EXAM:   Vitals:   06/28/22 1359  BP: 101/62  Pulse: 88  Resp: 20  Temp: 98.7 F (37.1 C)  SpO2: 94%  Weight: 142 lb (64.4 kg)  Height: 5\' 11"  (1.803 m)    GENERAL: The patient is a well-nourished male, in no acute distress. The  vital signs are documented above. CARDIAC: There is a regular rate and  rhythm.  VASCULAR: He has a palpable right radial pulse. He has a good thrill in his right upper arm fistula.  It is slightly pulsatile. He has 2 areas of concern as noted in the photographs below.    The skin beneath these areas is fairly mobile so I think we can simply excise the ulcers and closed over the fistula. PULMONARY: There is good air exchange bilaterally without wheezing or rales. ABDOMEN: Soft and non-tender with normal pitched bowel sounds.  MUSCULOSKELETAL: There are no major deformities or cyanosis. NEUROLOGIC: No focal weakness or paresthesias are detected. SKIN: There are no ulcers or rashes noted. PSYCHIATRIC: The patient has a normal affect.  DATA:    No new data  Deitra Mayo [facility] [phone]

## 2022-06-29 ENCOUNTER — Other Ambulatory Visit: Payer: Self-pay

## 2022-06-29 ENCOUNTER — Encounter (HOSPITAL_COMMUNITY): Payer: Self-pay | Admitting: Vascular Surgery

## 2022-06-29 DIAGNOSIS — N186 End stage renal disease: Secondary | ICD-10-CM

## 2022-06-29 NOTE — Progress Notes (Signed)
Spoke with pt for pre-op call. Pt has hx of CHF and "permanent" A-fib. Pt is not on an anticoagulant due hx of GI Bleed. Pt states he is not diabetic or treated for HTN. Pt is on Dialysis on Tu/Th/Sa. He also has sickle cell disease.   Shower instructions given to pt and he voiced understanding.

## 2022-07-02 ENCOUNTER — Encounter (HOSPITAL_COMMUNITY): Payer: Self-pay | Admitting: Vascular Surgery

## 2022-07-02 ENCOUNTER — Ambulatory Visit (HOSPITAL_COMMUNITY)
Admission: RE | Admit: 2022-07-02 | Discharge: 2022-07-02 | Disposition: A | Discharge status: Home / Self Care | Payer: PPO | Attending: Vascular Surgery | Admitting: Vascular Surgery

## 2022-07-02 ENCOUNTER — Ambulatory Visit (HOSPITAL_BASED_OUTPATIENT_CLINIC_OR_DEPARTMENT_OTHER): Payer: PPO | Admitting: Anesthesiology

## 2022-07-02 ENCOUNTER — Ambulatory Visit (HOSPITAL_COMMUNITY): Payer: PPO | Admitting: Anesthesiology

## 2022-07-02 ENCOUNTER — Other Ambulatory Visit: Payer: Self-pay

## 2022-07-02 ENCOUNTER — Encounter (HOSPITAL_COMMUNITY)
Admission: RE | Disposition: A | Discharge status: Home / Self Care | Payer: Self-pay | Source: Home / Self Care | Attending: Vascular Surgery

## 2022-07-02 ENCOUNTER — Other Ambulatory Visit: Payer: Self-pay | Admitting: Family Medicine

## 2022-07-02 DIAGNOSIS — Z87891 Personal history of nicotine dependence: Secondary | ICD-10-CM

## 2022-07-02 DIAGNOSIS — Z992 Dependence on renal dialysis: Secondary | ICD-10-CM | POA: Insufficient documentation

## 2022-07-02 DIAGNOSIS — K219 Gastro-esophageal reflux disease without esophagitis: Secondary | ICD-10-CM | POA: Diagnosis not present

## 2022-07-02 DIAGNOSIS — N186 End stage renal disease: Secondary | ICD-10-CM | POA: Insufficient documentation

## 2022-07-02 DIAGNOSIS — J449 Chronic obstructive pulmonary disease, unspecified: Secondary | ICD-10-CM | POA: Insufficient documentation

## 2022-07-02 DIAGNOSIS — D631 Anemia in chronic kidney disease: Secondary | ICD-10-CM | POA: Diagnosis not present

## 2022-07-02 DIAGNOSIS — D571 Sickle-cell disease without crisis: Secondary | ICD-10-CM | POA: Diagnosis not present

## 2022-07-02 DIAGNOSIS — I4891 Unspecified atrial fibrillation: Secondary | ICD-10-CM | POA: Insufficient documentation

## 2022-07-02 DIAGNOSIS — Z79899 Other long term (current) drug therapy: Secondary | ICD-10-CM | POA: Diagnosis not present

## 2022-07-02 DIAGNOSIS — I132 Hypertensive heart and chronic kidney disease with heart failure and with stage 5 chronic kidney disease, or end stage renal disease: Secondary | ICD-10-CM | POA: Insufficient documentation

## 2022-07-02 DIAGNOSIS — I509 Heart failure, unspecified: Secondary | ICD-10-CM | POA: Diagnosis not present

## 2022-07-02 DIAGNOSIS — L98499 Non-pressure chronic ulcer of skin of other sites with unspecified severity: Secondary | ICD-10-CM | POA: Diagnosis not present

## 2022-07-02 DIAGNOSIS — L98498 Non-pressure chronic ulcer of skin of other sites with other specified severity: Secondary | ICD-10-CM | POA: Diagnosis present

## 2022-07-02 HISTORY — DX: Pneumonia, unspecified organism: J18.9

## 2022-07-02 HISTORY — DX: Gastro-esophageal reflux disease without esophagitis: K21.9

## 2022-07-02 HISTORY — PX: REVISON OF ARTERIOVENOUS FISTULA: SHX6074

## 2022-07-02 LAB — POCT I-STAT, CHEM 8
BUN: 41 mg/dL — ABNORMAL HIGH (ref 8–23)
Calcium, Ion: 0.86 mmol/L — CL (ref 1.15–1.40)
Chloride: 92 mmol/L — ABNORMAL LOW (ref 98–111)
Creatinine, Ser: 7.5 mg/dL — ABNORMAL HIGH (ref 0.61–1.24)
Glucose, Bld: 95 mg/dL (ref 70–99)
HCT: 27 % — ABNORMAL LOW (ref 39.0–52.0)
Hemoglobin: 9.2 g/dL — ABNORMAL LOW (ref 13.0–17.0)
Potassium: 4.9 mmol/L (ref 3.5–5.1)
Sodium: 132 mmol/L — ABNORMAL LOW (ref 135–145)
TCO2: 27 mmol/L (ref 22–32)

## 2022-07-02 SURGERY — REVISON OF ARTERIOVENOUS FISTULA
Anesthesia: Monitor Anesthesia Care | Laterality: Right

## 2022-07-02 MED ORDER — ACETAMINOPHEN 500 MG PO TABS
1000.0000 mg | ORAL_TABLET | Freq: Once | ORAL | Status: AC
  Administered 2022-07-02: 1000 mg via ORAL
  Filled 2022-07-02: qty 2

## 2022-07-02 MED ORDER — HEPARIN 6000 UNIT IRRIGATION SOLUTION
Status: AC
  Filled 2022-07-02: qty 500

## 2022-07-02 MED ORDER — ORAL CARE MOUTH RINSE: 15.0000 mL | Freq: Once | OROMUCOSAL | Status: AC

## 2022-07-02 MED ORDER — SODIUM CHLORIDE 0.9 % IV SOLN: INTRAVENOUS | Status: DC

## 2022-07-02 MED ORDER — CHLORHEXIDINE GLUCONATE 4 % EX LIQD: 60.0000 mL | Freq: Once | CUTANEOUS | Status: DC

## 2022-07-02 MED ORDER — THROMBIN (RECOMBINANT) 20000 UNITS EX SOLR
CUTANEOUS | Status: AC
  Filled 2022-07-02: qty 20000

## 2022-07-02 MED ORDER — FENTANYL CITRATE (PF) 250 MCG/5ML IJ SOLN
INTRAMUSCULAR | Status: AC
  Filled 2022-07-02: qty 5

## 2022-07-02 MED ORDER — FENTANYL CITRATE (PF) 100 MCG/2ML IJ SOLN: 25.0000 ug | INTRAMUSCULAR | Status: DC | PRN

## 2022-07-02 MED ORDER — CEFAZOLIN SODIUM-DEXTROSE 2-4 GM/100ML-% IV SOLN
2.0000 g | INTRAVENOUS | Status: AC
  Administered 2022-07-02: 2 g via INTRAVENOUS

## 2022-07-02 MED ORDER — MIDAZOLAM HCL 2 MG/2ML IJ SOLN
INTRAMUSCULAR | Status: DC | PRN
  Administered 2022-07-02: 1 mg via INTRAVENOUS

## 2022-07-02 MED ORDER — CEFAZOLIN SODIUM-DEXTROSE 2-4 GM/100ML-% IV SOLN
INTRAVENOUS | Status: AC
  Filled 2022-07-02: qty 100

## 2022-07-02 MED ORDER — CHLORHEXIDINE GLUCONATE 0.12 % MT SOLN: 15.0000 mL | Freq: Once | OROMUCOSAL | Status: AC

## 2022-07-02 MED ORDER — 0.9 % SODIUM CHLORIDE (POUR BTL) OPTIME
TOPICAL | Status: DC | PRN
  Administered 2022-07-02: 1000 mL

## 2022-07-02 MED ORDER — MIDAZOLAM HCL 2 MG/2ML IJ SOLN
INTRAMUSCULAR | Status: AC
  Filled 2022-07-02: qty 2

## 2022-07-02 MED ORDER — OXYCODONE-ACETAMINOPHEN 5-325 MG PO TABS: 1.0000 | ORAL_TABLET | ORAL | 0 refills | Status: DC | PRN

## 2022-07-02 MED ORDER — LIDOCAINE HCL (PF) 1 % IJ SOLN
INTRAMUSCULAR | Status: AC
  Filled 2022-07-02: qty 30

## 2022-07-02 MED ORDER — ONDANSETRON HCL 4 MG/2ML IJ SOLN
INTRAMUSCULAR | Status: DC | PRN
  Administered 2022-07-02: 4 mg via INTRAVENOUS

## 2022-07-02 MED ORDER — CHLORHEXIDINE GLUCONATE 0.12 % MT SOLN
OROMUCOSAL | Status: AC
  Administered 2022-07-02: 15 mL via OROMUCOSAL
  Filled 2022-07-02: qty 15

## 2022-07-02 MED ORDER — LIDOCAINE-EPINEPHRINE (PF) 1 %-1:200000 IJ SOLN
INTRAMUSCULAR | Status: DC | PRN
  Administered 2022-07-02: 17 mL

## 2022-07-02 MED ORDER — PROPOFOL 500 MG/50ML IV EMUL
INTRAVENOUS | Status: DC | PRN
  Administered 2022-07-02: 100 ug/kg/min via INTRAVENOUS

## 2022-07-02 MED ORDER — LIDOCAINE-EPINEPHRINE (PF) 1 %-1:200000 IJ SOLN
INTRAMUSCULAR | Status: AC
  Filled 2022-07-02: qty 30

## 2022-07-02 MED ORDER — FENTANYL CITRATE (PF) 250 MCG/5ML IJ SOLN
INTRAMUSCULAR | Status: DC | PRN
  Administered 2022-07-02: 25 ug via INTRAVENOUS

## 2022-07-02 SURGICAL SUPPLY — 36 items
ARMBAND PINK RESTRICT EXTREMIT (MISCELLANEOUS) ×1 IMPLANT
BAG COUNTER SPONGE SURGICOUNT (BAG) ×1 IMPLANT
CANISTER SUCT 3000ML PPV (MISCELLANEOUS) ×1 IMPLANT
CANNULA VESSEL 3MM 2 BLNT TIP (CANNULA) ×1 IMPLANT
CLIP VESOCCLUDE MED 6/CT (CLIP) ×1 IMPLANT
CLIP VESOCCLUDE SM WIDE 6/CT (CLIP) ×1 IMPLANT
COVER PROBE W GEL 5X96 (DRAPES) IMPLANT
DERMABOND ADVANCED .7 DNX12 (GAUZE/BANDAGES/DRESSINGS) ×1 IMPLANT
ELECT REM PT RETURN 9FT ADLT (ELECTROSURGICAL) ×1
ELECTRODE REM PT RTRN 9FT ADLT (ELECTROSURGICAL) ×1 IMPLANT
GLOVE BIO SURGEON STRL SZ7 (GLOVE) IMPLANT
GLOVE BIO SURGEON STRL SZ7.5 (GLOVE) ×1 IMPLANT
GLOVE BIOGEL PI IND STRL 6.5 (GLOVE) IMPLANT
GLOVE BIOGEL PI IND STRL 7.5 (GLOVE) IMPLANT
GLOVE BIOGEL PI IND STRL 8 (GLOVE) ×1 IMPLANT
GLOVE ECLIPSE 6.5 STRL STRAW (GLOVE) IMPLANT
GLOVE ECLIPSE 7.0 STRL STRAW (GLOVE) IMPLANT
GLOVE SURG POLY ORTHO LF SZ7.5 (GLOVE) IMPLANT
GLOVE SURG UNDER LTX SZ8 (GLOVE) ×1 IMPLANT
GOWN STRL REUS W/ TWL LRG LVL3 (GOWN DISPOSABLE) ×3 IMPLANT
GOWN STRL REUS W/TWL LRG LVL3 (GOWN DISPOSABLE) ×4
KIT BASIN OR (CUSTOM PROCEDURE TRAY) ×1 IMPLANT
KIT TURNOVER KIT B (KITS) ×1 IMPLANT
NS IRRIG 1000ML POUR BTL (IV SOLUTION) ×1 IMPLANT
PACK CV ACCESS (CUSTOM PROCEDURE TRAY) ×1 IMPLANT
PAD ARMBOARD 7.5X6 YLW CONV (MISCELLANEOUS) ×2 IMPLANT
SLING ARM FOAM STRAP LRG (SOFTGOODS) IMPLANT
SLING ARM FOAM STRAP MED (SOFTGOODS) IMPLANT
SPONGE SURGIFOAM ABS GEL 100 (HEMOSTASIS) IMPLANT
SUT MNCRL AB 4-0 PS2 18 (SUTURE) ×1 IMPLANT
SUT PROLENE 6 0 BV (SUTURE) ×1 IMPLANT
SUT VIC AB 3-0 SH 27 (SUTURE) ×1
SUT VIC AB 3-0 SH 27X BRD (SUTURE) ×1 IMPLANT
TOWEL GREEN STERILE (TOWEL DISPOSABLE) ×1 IMPLANT
UNDERPAD 30X36 HEAVY ABSORB (UNDERPADS AND DIAPERS) ×1 IMPLANT
WATER STERILE IRR 1000ML POUR (IV SOLUTION) ×1 IMPLANT

## 2022-07-02 NOTE — Anesthesia Preprocedure Evaluation (Addendum)
Anesthesia Evaluation  Patient identified by MRN, date of birth, ID band Patient awake    Reviewed: Allergy & Precautions, H&P , NPO status , Patient's Chart, lab work & pertinent test results  Airway Mallampati: II  TM Distance: >3 FB Neck ROM: Full    Dental no notable dental hx. (+) Poor Dentition, Dental Advisory Given   Pulmonary COPD, former smoker   Pulmonary exam normal breath sounds clear to auscultation       Cardiovascular hypertension, +CHF  + dysrhythmias Atrial Fibrillation  Rhythm:Regular Rate:Normal     Neuro/Psych negative neurological ROS  negative psych ROS   GI/Hepatic Neg liver ROS, hiatal hernia,GERD  Medicated,,  Endo/Other  negative endocrine ROS    Renal/GU ESRF and DialysisRenal disease  negative genitourinary   Musculoskeletal   Abdominal   Peds  Hematology  (+) Blood dyscrasia, Sickle cell anemia and anemia   Anesthesia Other Findings   Reproductive/Obstetrics negative OB ROS                             Anesthesia Physical Anesthesia Plan  ASA: 3  Anesthesia Plan: MAC   Post-op Pain Management: Tylenol PO (pre-op)*   Induction: Intravenous  PONV Risk Score and Plan: 2 and Ondansetron, Dexamethasone and Midazolam  Airway Management Planned: Natural Airway and Simple Face Mask  Additional Equipment:   Intra-op Plan:   Post-operative Plan:   Informed Consent: I have reviewed the patients History and Physical, chart, labs and discussed the procedure including the risks, benefits and alternatives for the proposed anesthesia with the patient or authorized representative who has indicated his/her understanding and acceptance.     Dental advisory given  Plan Discussed with: CRNA  Anesthesia Plan Comments:        Anesthesia Quick Evaluation

## 2022-07-02 NOTE — Op Note (Signed)
    NAME: Patrick [name]    MRN: [id number] DOB: [birth year]    DATE OF OPERATION: 07/02/2022  PREOP DIAGNOSIS:    Ulcers overlying right AV fistula  POSTOP DIAGNOSIS:    Same  PROCEDURE:    Excision of 2 ulcers overlying right arm AV fistula  SURGEON: Judeth Cornfield. Scot Dock, MD  ASSIST: Laurence Slate, PA  ANESTHESIA: Local with sedation  EBL: Minimal  INDICATIONS:    Patrick [name] is a 67 y.o. male who dialyzes on Tuesdays Thursdays and Saturdays.  He had 2 ulcers overlying his AV fistula and presents for excision of these ulcers.  FINDINGS:   Excellent thrill at the completion of the procedure.  TECHNIQUE:   The patient was taken to the operating room and sedated by anesthesia.  The right arm was prepped and draped in usual sterile fashion.  An elliptical incision was made encompassing both areas of concern.  The skin was anesthetized with 1% lidocaine with epinephrine.  Incisions were made with a 15 blade and the ulcers were excised.  There was tissue overlying the fistula and the fistula was not involved with the ulcers.  I then undermined the skin circumferentially from both of the wounds.  Hemostasis was obtained in the wounds.  Each of the wounds was closed with a deep layer of 3-0 Vicryl and the skin closed with 4-0 Monocryl.  Dermabond was applied.  The patient tolerated the procedure well was transferred to the recovery room in stable condition.  All needle and sponge counts were correct.  Given the complexity of the case a first assistant was necessary in order to expedient the procedure and safely perform the technical aspects of the operation.  Deitra Mayo, MD, FACS [facility]  DATE OF DICTATION:   07/02/2022

## 2022-07-02 NOTE — Progress Notes (Addendum)
VASCULAR SURGERY:  This patient has 2 ulcerations overlying his right arm fistula as documented in the photograph below.  I feel like we can excise these ulcers and he should have room to stick the graft above and below these 2 areas.  He dialyzes on Tuesdays Thursdays and Saturdays.  All of his questions were answered and he is agreeable to proceed.    Gae Gallop, MD 8:58 AM

## 2022-07-02 NOTE — Anesthesia Postprocedure Evaluation (Signed)
Anesthesia Post Note  Patient: Patrick [name]  Procedure(s) Performed: EXCISION OF ULCERS RIGHT ARTERIOVENOUS FISTULA (Right)     Patient location during evaluation: PACU Anesthesia Type: MAC Level of consciousness: awake and alert Pain management: pain level controlled Vital Signs Assessment: post-procedure vital signs reviewed and stable Respiratory status: spontaneous breathing, nonlabored ventilation and respiratory function stable Cardiovascular status: stable and blood pressure returned to baseline Postop Assessment: no apparent nausea or vomiting Anesthetic complications: no  No notable events documented.  Last Vitals:  Vitals:   07/02/22 1045 07/02/22 1100  BP: 92/60 115/73  Pulse: 83 83  Resp: 15 17  Temp:  36.6 C  SpO2: 96% 98%    Last Pain:  Vitals:   07/02/22 1100  TempSrc:   PainSc: 0-No pain                 Francois Elk,W. EDMOND

## 2022-07-02 NOTE — Transfer of Care (Signed)
Immediate Anesthesia Transfer of Care Note  Patient: Patrick [name]  Procedure(s) Performed: EXCISION OF ULCERS RIGHT ARTERIOVENOUS FISTULA (Right)  Patient Location: PACU  Anesthesia Type:MAC  Level of Consciousness: awake and patient cooperative  Airway & Oxygen Therapy: Patient Spontanous Breathing and Patient connected to face mask oxygen  Post-op Assessment: Report given to RN, Post -op Vital signs reviewed and stable and Patient moving all extremities  Post vital signs: Reviewed and stable  Last Vitals:  Vitals Value Taken Time  BP 97/70 07/02/22 1030  Temp 97.8 07/02/22 1033  Pulse 85 07/02/22 1033  Resp 15 07/02/22 1033  SpO2 100 % 07/02/22 1033  Vitals shown include unvalidated device data.  Last Pain:  Vitals:   07/02/22 0719  TempSrc:   PainSc: 0-No pain         Complications: No notable events documented.

## 2022-07-03 ENCOUNTER — Ambulatory Visit (HOSPITAL_COMMUNITY)
Admission: RE | Admit: 2022-07-03 | Discharge: 2022-07-03 | Disposition: A | Discharge status: Home / Self Care | Payer: PPO | Source: Ambulatory Visit | Attending: Gastroenterology | Admitting: Gastroenterology

## 2022-07-03 ENCOUNTER — Encounter (HOSPITAL_COMMUNITY): Payer: Self-pay | Admitting: Vascular Surgery

## 2022-07-03 DIAGNOSIS — N186 End stage renal disease: Secondary | ICD-10-CM | POA: Diagnosis not present

## 2022-07-03 DIAGNOSIS — D5700 Hb-SS disease with crisis, unspecified: Secondary | ICD-10-CM | POA: Diagnosis not present

## 2022-07-03 DIAGNOSIS — D57219 Sickle-cell/Hb-C disease with crisis, unspecified: Secondary | ICD-10-CM | POA: Diagnosis not present

## 2022-07-03 DIAGNOSIS — K769 Liver disease, unspecified: Secondary | ICD-10-CM | POA: Diagnosis not present

## 2022-07-03 DIAGNOSIS — D57819 Other sickle-cell disorders with crisis, unspecified: Secondary | ICD-10-CM | POA: Diagnosis not present

## 2022-07-03 DIAGNOSIS — R188 Other ascites: Secondary | ICD-10-CM | POA: Diagnosis not present

## 2022-07-03 DIAGNOSIS — Z992 Dependence on renal dialysis: Secondary | ICD-10-CM | POA: Diagnosis not present

## 2022-07-03 DIAGNOSIS — N2889 Other specified disorders of kidney and ureter: Secondary | ICD-10-CM | POA: Diagnosis not present

## 2022-07-03 DIAGNOSIS — D509 Iron deficiency anemia, unspecified: Secondary | ICD-10-CM | POA: Diagnosis not present

## 2022-07-03 DIAGNOSIS — D571 Sickle-cell disease without crisis: Secondary | ICD-10-CM | POA: Diagnosis not present

## 2022-07-03 DIAGNOSIS — K7460 Unspecified cirrhosis of liver: Secondary | ICD-10-CM | POA: Diagnosis not present

## 2022-07-03 DIAGNOSIS — N2581 Secondary hyperparathyroidism of renal origin: Secondary | ICD-10-CM | POA: Diagnosis not present

## 2022-07-03 MED ORDER — IOHEXOL 300 MG/ML  SOLN
80.0000 mL | Freq: Once | INTRAMUSCULAR | Status: AC | PRN
  Administered 2022-07-03: 80 mL via INTRAVENOUS

## 2022-07-03 MED ORDER — SODIUM CHLORIDE (PF) 0.9 % IJ SOLN
INTRAMUSCULAR | Status: AC
  Filled 2022-07-03: qty 50

## 2022-07-03 NOTE — H&P (Signed)
REASON FOR VISIT:    To evaluate ulcer overlying right arm fistula.   MEDICAL ISSUES:    END-STAGE RENAL DISEASE: This patient presents with 2 ulcerations overlying his right arm fistula as documented in the photograph below.  I think we can excise these ulcers and they should still have room to stick the graft above and below these 2 areas.  Thus I do not think she will need a catheter.  He dialyzes on Tuesdays Thursdays and Saturdays I have scheduled this on Monday (07/09/2022).  We have discussed the indications for the procedure and the potential complications and he is agreeable to proceed.   HPI:    Patrick [name] is a pleasant 67 y.o. male who has a left upper arm fistula.  He states this was placed about 8 years ago.  He has developed 2 areas overlying the fistula that were concerning and he was sent for evaluation.  Of note he dialyzes on Tuesdays Thursdays and Saturdays.       Past Medical History:  Diagnosis Date   A-fib ([place])     Blood dyscrasia     Blood transfusion      "I've had a bunch of them"   CHF (congestive heart failure) ([place])      followed by [facility] clinic   Chronic kidney disease     Dialysis patient ([place]) 04/2019    AV fistula (right arm), TTS   Dysrhythmia      A-Fib per patient   Early satiety     Elevated LFTs 04/01/2022   Elevated troponin 04/01/2022   Empyema of gallbladder 05/27/2021   Gout     Headache(784.0)      "probably weekly" (01/13/2014)   Legally blind     Metabolic acidosis with increased anion gap and accumulation of organic acids 04/01/2022   Mitral regurgitation     Muscle tightness-Right arm  05/27/2014   Pulmonary hypertension (HCC)     Shortness of breath dyspnea     Sickle cell disease, type SS (HCC)     Swelling abdomen     Swelling of extremity, left     Swelling of extremity, right     Tricuspid regurgitation             Family History  Problem Relation Age of Onset   Alcohol abuse Mother     Liver disease Mother      Cirrhosis Mother     Heart attack Mother        SOCIAL HISTORY: Social History         Tobacco Use   Smoking status: Former      Packs/day: 0.50      Years: 41.00      Total pack years: 20.50      Types: Cigarettes      Quit date: 12/08/2015      Years since quitting: 6.5   Smokeless tobacco: Never  Substance Use Topics   Alcohol use: Not Currently      Alcohol/week: 3.0 standard drinks of alcohol      Types: 1 Glasses of wine, 2 Cans of beer per week      Comment: none since starting dialysis 06/2021      No Known Allergies         Current Outpatient Medications  Medication Sig Dispense Refill   acetaminophen (TYLENOL) 500 MG tablet Take 1,500 mg by mouth 3 (three) times daily as needed for mild pain, moderate pain, fever or headache.  allopurinol (ZYLOPRIM) 100 MG tablet Take 1 tablet (100 mg total) by mouth daily. 30 tablet 1   AURYXIA 1 GM 210 MG(Fe) tablet Take 210-420 mg by mouth See admin instructions. 420 mg with every meal, 210 mg with each snack.       cinacalcet (SENSIPAR) 30 MG tablet Take 1 tablet (30 mg total) by mouth Every Tuesday,Thursday,and Saturday with dialysis. 60 tablet 0   diphenhydramine-acetaminophen (TYLENOL PM) 25-500 MG TABS tablet Take 2 tablets by mouth at bedtime as needed (sleep/pain).       folic acid (FOLVITE) 1 MG tablet Take 1 tablet (1 mg total) by mouth daily. 30 tablet 1   hydroxyurea (HYDREA) 500 MG capsule Take 1 capsule (500 mg total) by mouth daily. 90 capsule 1   Iron-Vitamins (GERITOL PO) Take 15 mLs by mouth daily.       lidocaine-prilocaine (EMLA) cream Apply 1 application. topically Every Tuesday,Thursday,and Saturday with dialysis.       midodrine (PROAMATINE) 2.5 MG tablet Take 1 tablet (2.5 mg total) by mouth 3 (three) times daily with meals. 90 tablet 0   Oxycodone HCl 10 MG TABS Take 1 tablet (10 mg total) by mouth every 6 (six) hours as needed. 60 tablet 0   pantoprazole (PROTONIX) 40 MG tablet Take 1 tablet (40 mg  total) by mouth daily. 180 tablet 1   tiZANidine (ZANAFLEX) 4 MG tablet TAKE 1/2 TABLET (2MG  TOTAL) BY MOUTH EVERY 8 HOURS AS NEEDED FOR MUSCLE SPASM (Patient taking differently: Take 2 mg by mouth 3 (three) times daily as needed for muscle spasms.) 30 tablet 2   torsemide 40 MG TABS Take 80 mg by mouth daily. 30 tablet 0   Vitamin D, Ergocalciferol, (DRISDOL) 1.25 MG (50000 UNIT) CAPS capsule Take 1 capsule (50,000 Units total) by mouth once a week. Monday (Patient taking differently: Take 50,000 Units by mouth every Monday.) 5 capsule 0    No current facility-administered medications for this visit.      REVIEW OF SYSTEMS:  [X]  denotes positive finding, [ ]  denotes negative finding Cardiac   Comments:  Chest pain or chest pressure:      Shortness of breath upon exertion:      Short of breath when lying flat:      Irregular heart rhythm:             Vascular      Pain in calf, thigh, or hip brought on by ambulation:      Pain in feet at night that wakes you up from your sleep:       Blood clot in your veins:      Leg swelling:              Pulmonary      Oxygen at home:      Productive cough:       Wheezing:              Neurologic      Sudden weakness in arms or legs:       Sudden numbness in arms or legs:       Sudden onset of difficulty speaking or slurred speech:      Temporary loss of vision in one eye:       Problems with dizziness:              Gastrointestinal      Blood in stool:       Vomited blood:  Genitourinary      Burning when urinating:       Blood in urine:             Psychiatric      Major depression:              Hematologic      Bleeding problems:      Problems with blood clotting too easily:             Skin      Rashes or ulcers:             Constitutional      Fever or chills:        PHYSICAL EXAM:       Vitals:    06/28/22 1359  BP: 101/62  Pulse: 88  Resp: 20  Temp: 98.7 F (37.1 C)  SpO2: 94%  Weight: 142 lb  (64.4 kg)  Height: 5\' 11"  (1.803 m)      GENERAL: The patient is a well-nourished male, in no acute distress. The vital signs are documented above. CARDIAC: There is a regular rate and rhythm.  VASCULAR: He has a palpable right radial pulse. He has a good thrill in his right upper arm fistula.  It is slightly pulsatile. He has 2 areas of concern as noted in the photographs below.     The skin beneath these areas is fairly mobile so I think we can simply excise the ulcers and closed over the fistula. PULMONARY: There is good air exchange bilaterally without wheezing or rales. ABDOMEN: Soft and non-tender with normal pitched bowel sounds.  MUSCULOSKELETAL: There are no major deformities or cyanosis. NEUROLOGIC: No focal weakness or paresthesias are detected. SKIN: There are no ulcers or rashes noted. PSYCHIATRIC: The patient has a normal affect.   DATA:     No new data

## 2022-07-04 ENCOUNTER — Other Ambulatory Visit: Payer: Self-pay | Admitting: Gastroenterology

## 2022-07-04 ENCOUNTER — Ambulatory Visit (HOSPITAL_COMMUNITY)
Admission: RE | Admit: 2022-07-04 | Discharge: 2022-07-04 | Disposition: A | Discharge status: Home / Self Care | Payer: PPO | Source: Ambulatory Visit | Attending: Gastroenterology | Admitting: Gastroenterology

## 2022-07-04 DIAGNOSIS — R188 Other ascites: Secondary | ICD-10-CM | POA: Diagnosis not present

## 2022-07-04 DIAGNOSIS — K862 Cyst of pancreas: Secondary | ICD-10-CM

## 2022-07-04 DIAGNOSIS — K839 Disease of biliary tract, unspecified: Secondary | ICD-10-CM

## 2022-07-04 DIAGNOSIS — K7460 Unspecified cirrhosis of liver: Secondary | ICD-10-CM | POA: Diagnosis not present

## 2022-07-04 HISTORY — PX: IR PARACENTESIS: IMG2679

## 2022-07-04 LAB — BODY FLUID CELL COUNT WITH DIFFERENTIAL
Eos, Fluid: 1 %
Lymphs, Fluid: 13 %
Monocyte-Macrophage-Serous Fluid: 85 % (ref 50–90)
Neutrophil Count, Fluid: 1 % (ref 0–25)
Total Nucleated Cell Count, Fluid: 34 cu mm (ref 0–1000)

## 2022-07-04 LAB — AMYLASE, PLEURAL OR PERITONEAL FLUID: Amylase, Fluid: 32 U/L

## 2022-07-04 LAB — GRAM STAIN

## 2022-07-04 LAB — PROTEIN, PLEURAL OR PERITONEAL FLUID: Total protein, fluid: 4.9 g/dL

## 2022-07-04 LAB — ALBUMIN, PLEURAL OR PERITONEAL FLUID: Albumin, Fluid: 2.3 g/dL

## 2022-07-04 MED ORDER — LIDOCAINE HCL 1 % IJ SOLN
INTRAMUSCULAR | Status: AC
  Filled 2022-07-04: qty 20

## 2022-07-04 NOTE — Procedures (Signed)
PROCEDURE SUMMARY:  Successful US guided paracentesis from left abdomen.  Yielded 700 ml of thin, light brown fluid.  No immediate complications.  Pt tolerated well.   Specimen sent for labs.  EBL < 2 mL  Theresa Duty, NP 07/04/2022 11:08 AM

## 2022-07-05 ENCOUNTER — Ambulatory Visit: Payer: HMO | Admitting: Gastroenterology

## 2022-07-06 LAB — CYTOLOGY - NON PAP

## 2022-07-08 LAB — LIPASE, FLUID: Lipase-Fluid: 24 U/L

## 2022-07-08 LAB — TOTAL BILIRUBIN, BODY FLUID: Total bilirubin, fluid: 0.5 mg/dL

## 2022-07-09 LAB — CULTURE, BODY FLUID W GRAM STAIN -BOTTLE: Culture: NO GROWTH

## 2022-07-10 DIAGNOSIS — D5700 Hb-SS disease with crisis, unspecified: Secondary | ICD-10-CM | POA: Diagnosis not present

## 2022-07-10 DIAGNOSIS — N2581 Secondary hyperparathyroidism of renal origin: Secondary | ICD-10-CM | POA: Diagnosis not present

## 2022-07-10 DIAGNOSIS — D57819 Other sickle-cell disorders with crisis, unspecified: Secondary | ICD-10-CM | POA: Diagnosis not present

## 2022-07-10 DIAGNOSIS — D631 Anemia in chronic kidney disease: Secondary | ICD-10-CM | POA: Diagnosis not present

## 2022-07-10 DIAGNOSIS — N186 End stage renal disease: Secondary | ICD-10-CM | POA: Diagnosis not present

## 2022-07-10 DIAGNOSIS — Z992 Dependence on renal dialysis: Secondary | ICD-10-CM | POA: Diagnosis not present

## 2022-07-10 DIAGNOSIS — D57219 Sickle-cell/Hb-C disease with crisis, unspecified: Secondary | ICD-10-CM | POA: Diagnosis not present

## 2022-07-10 DIAGNOSIS — D571 Sickle-cell disease without crisis: Secondary | ICD-10-CM | POA: Diagnosis not present

## 2022-07-10 DIAGNOSIS — D509 Iron deficiency anemia, unspecified: Secondary | ICD-10-CM | POA: Diagnosis not present

## 2022-07-17 DIAGNOSIS — D57219 Sickle-cell/Hb-C disease with crisis, unspecified: Secondary | ICD-10-CM | POA: Diagnosis not present

## 2022-07-17 DIAGNOSIS — D571 Sickle-cell disease without crisis: Secondary | ICD-10-CM | POA: Diagnosis not present

## 2022-07-17 DIAGNOSIS — D57819 Other sickle-cell disorders with crisis, unspecified: Secondary | ICD-10-CM | POA: Diagnosis not present

## 2022-07-17 DIAGNOSIS — D5700 Hb-SS disease with crisis, unspecified: Secondary | ICD-10-CM | POA: Diagnosis not present

## 2022-07-17 DIAGNOSIS — Z992 Dependence on renal dialysis: Secondary | ICD-10-CM | POA: Diagnosis not present

## 2022-07-17 DIAGNOSIS — N2581 Secondary hyperparathyroidism of renal origin: Secondary | ICD-10-CM | POA: Diagnosis not present

## 2022-07-17 DIAGNOSIS — N186 End stage renal disease: Secondary | ICD-10-CM | POA: Diagnosis not present

## 2022-07-18 DIAGNOSIS — Z992 Dependence on renal dialysis: Secondary | ICD-10-CM | POA: Diagnosis not present

## 2022-07-18 DIAGNOSIS — N186 End stage renal disease: Secondary | ICD-10-CM | POA: Diagnosis not present

## 2022-07-18 DIAGNOSIS — I158 Other secondary hypertension: Secondary | ICD-10-CM | POA: Diagnosis not present

## 2022-07-19 ENCOUNTER — Other Ambulatory Visit: Payer: Self-pay | Admitting: Internal Medicine

## 2022-07-19 DIAGNOSIS — N2581 Secondary hyperparathyroidism of renal origin: Secondary | ICD-10-CM | POA: Diagnosis not present

## 2022-07-19 DIAGNOSIS — D57219 Sickle-cell/Hb-C disease with crisis, unspecified: Secondary | ICD-10-CM | POA: Diagnosis not present

## 2022-07-19 DIAGNOSIS — D509 Iron deficiency anemia, unspecified: Secondary | ICD-10-CM | POA: Diagnosis not present

## 2022-07-19 DIAGNOSIS — Z992 Dependence on renal dialysis: Secondary | ICD-10-CM | POA: Diagnosis not present

## 2022-07-19 DIAGNOSIS — D571 Sickle-cell disease without crisis: Secondary | ICD-10-CM

## 2022-07-19 DIAGNOSIS — N186 End stage renal disease: Secondary | ICD-10-CM | POA: Diagnosis not present

## 2022-07-19 DIAGNOSIS — D631 Anemia in chronic kidney disease: Secondary | ICD-10-CM | POA: Diagnosis not present

## 2022-07-19 DIAGNOSIS — D5700 Hb-SS disease with crisis, unspecified: Secondary | ICD-10-CM | POA: Diagnosis not present

## 2022-07-19 DIAGNOSIS — D57819 Other sickle-cell disorders with crisis, unspecified: Secondary | ICD-10-CM | POA: Diagnosis not present

## 2022-07-19 NOTE — Telephone Encounter (Signed)
Caller & Relationship to patient:  MRN #  [id number]   Call Back Number:   Date of Last Office Visit: 07/02/2022     Date of Next Office Visit: Visit date not found    Medication(s) to be Refilled: Oxycodone ,back spasm med   Preferred Pharmacy:   ** Please notify patient to allow 48-72 hours to process** **Let patient know to contact pharmacy at the end of the day to make sure medication is ready. ** **If patient has not been seen in a year or longer, book an appointment **Advise to use MyChart for refill requests OR to contact their pharmacy

## 2022-07-20 NOTE — Addendum Note (Signed)
Addended by: Angus Seller on: 07/20/2022 12:25 PM   Modules accepted: Orders

## 2022-07-23 ENCOUNTER — Other Ambulatory Visit: Payer: Self-pay | Admitting: Family Medicine

## 2022-07-23 ENCOUNTER — Other Ambulatory Visit: Payer: Self-pay

## 2022-07-23 ENCOUNTER — Encounter (HOSPITAL_COMMUNITY): Payer: Self-pay

## 2022-07-23 ENCOUNTER — Inpatient Hospital Stay (HOSPITAL_COMMUNITY)
Admission: EM | Admit: 2022-07-23 | Discharge: 2022-07-27 | DRG: 193 | Disposition: A | Discharge status: Home Health | Payer: PPO | Attending: Internal Medicine | Admitting: Internal Medicine

## 2022-07-23 ENCOUNTER — Emergency Department (HOSPITAL_COMMUNITY): Payer: PPO

## 2022-07-23 DIAGNOSIS — I5022 Chronic systolic (congestive) heart failure: Secondary | ICD-10-CM | POA: Diagnosis present

## 2022-07-23 DIAGNOSIS — E162 Hypoglycemia, unspecified: Secondary | ICD-10-CM | POA: Diagnosis present

## 2022-07-23 DIAGNOSIS — I132 Hypertensive heart and chronic kidney disease with heart failure and with stage 5 chronic kidney disease, or end stage renal disease: Secondary | ICD-10-CM | POA: Diagnosis present

## 2022-07-23 DIAGNOSIS — D631 Anemia in chronic kidney disease: Secondary | ICD-10-CM | POA: Diagnosis present

## 2022-07-23 DIAGNOSIS — I10 Essential (primary) hypertension: Secondary | ICD-10-CM | POA: Diagnosis present

## 2022-07-23 DIAGNOSIS — E872 Acidosis, unspecified: Secondary | ICD-10-CM | POA: Diagnosis present

## 2022-07-23 DIAGNOSIS — J449 Chronic obstructive pulmonary disease, unspecified: Secondary | ICD-10-CM | POA: Diagnosis not present

## 2022-07-23 DIAGNOSIS — I081 Rheumatic disorders of both mitral and tricuspid valves: Secondary | ICD-10-CM | POA: Diagnosis present

## 2022-07-23 DIAGNOSIS — I959 Hypotension, unspecified: Secondary | ICD-10-CM | POA: Diagnosis not present

## 2022-07-23 DIAGNOSIS — J189 Pneumonia, unspecified organism: Principal | ICD-10-CM | POA: Diagnosis present

## 2022-07-23 DIAGNOSIS — Z79899 Other long term (current) drug therapy: Secondary | ICD-10-CM

## 2022-07-23 DIAGNOSIS — N186 End stage renal disease: Secondary | ICD-10-CM | POA: Diagnosis present

## 2022-07-23 DIAGNOSIS — G894 Chronic pain syndrome: Secondary | ICD-10-CM | POA: Diagnosis present

## 2022-07-23 DIAGNOSIS — Z91148 Patient's other noncompliance with medication regimen for other reason: Secondary | ICD-10-CM

## 2022-07-23 DIAGNOSIS — Z87891 Personal history of nicotine dependence: Secondary | ICD-10-CM | POA: Diagnosis not present

## 2022-07-23 DIAGNOSIS — E8779 Other fluid overload: Secondary | ICD-10-CM | POA: Diagnosis not present

## 2022-07-23 DIAGNOSIS — Z811 Family history of alcohol abuse and dependence: Secondary | ICD-10-CM | POA: Diagnosis not present

## 2022-07-23 DIAGNOSIS — I272 Pulmonary hypertension, unspecified: Secondary | ICD-10-CM | POA: Diagnosis present

## 2022-07-23 DIAGNOSIS — M109 Gout, unspecified: Secondary | ICD-10-CM | POA: Diagnosis present

## 2022-07-23 DIAGNOSIS — I4891 Unspecified atrial fibrillation: Secondary | ICD-10-CM | POA: Diagnosis present

## 2022-07-23 DIAGNOSIS — R531 Weakness: Secondary | ICD-10-CM | POA: Diagnosis not present

## 2022-07-23 DIAGNOSIS — Z992 Dependence on renal dialysis: Secondary | ICD-10-CM

## 2022-07-23 DIAGNOSIS — R609 Edema, unspecified: Secondary | ICD-10-CM | POA: Diagnosis not present

## 2022-07-23 DIAGNOSIS — Z8249 Family history of ischemic heart disease and other diseases of the circulatory system: Secondary | ICD-10-CM

## 2022-07-23 DIAGNOSIS — R0602 Shortness of breath: Secondary | ICD-10-CM | POA: Diagnosis present

## 2022-07-23 DIAGNOSIS — Y95 Nosocomial condition: Secondary | ICD-10-CM | POA: Diagnosis present

## 2022-07-23 DIAGNOSIS — R9431 Abnormal electrocardiogram [ECG] [EKG]: Secondary | ICD-10-CM | POA: Diagnosis present

## 2022-07-23 DIAGNOSIS — M545 Low back pain, unspecified: Secondary | ICD-10-CM

## 2022-07-23 DIAGNOSIS — K219 Gastro-esophageal reflux disease without esophagitis: Secondary | ICD-10-CM | POA: Diagnosis present

## 2022-07-23 DIAGNOSIS — H548 Legal blindness, as defined in USA: Secondary | ICD-10-CM | POA: Diagnosis present

## 2022-07-23 DIAGNOSIS — I4819 Other persistent atrial fibrillation: Secondary | ICD-10-CM | POA: Diagnosis present

## 2022-07-23 DIAGNOSIS — E11649 Type 2 diabetes mellitus with hypoglycemia without coma: Secondary | ICD-10-CM | POA: Diagnosis not present

## 2022-07-23 DIAGNOSIS — D571 Sickle-cell disease without crisis: Secondary | ICD-10-CM

## 2022-07-23 DIAGNOSIS — R140 Abdominal distension (gaseous): Secondary | ICD-10-CM | POA: Diagnosis not present

## 2022-07-23 DIAGNOSIS — R000 Tachycardia, unspecified: Secondary | ICD-10-CM | POA: Diagnosis not present

## 2022-07-23 DIAGNOSIS — J440 Chronic obstructive pulmonary disease with acute lower respiratory infection: Secondary | ICD-10-CM | POA: Diagnosis present

## 2022-07-23 DIAGNOSIS — I4821 Permanent atrial fibrillation: Secondary | ICD-10-CM | POA: Diagnosis present

## 2022-07-23 DIAGNOSIS — N250 Renal osteodystrophy: Secondary | ICD-10-CM | POA: Diagnosis not present

## 2022-07-23 DIAGNOSIS — I5023 Acute on chronic systolic (congestive) heart failure: Secondary | ICD-10-CM | POA: Diagnosis present

## 2022-07-23 DIAGNOSIS — I120 Hypertensive chronic kidney disease with stage 5 chronic kidney disease or end stage renal disease: Secondary | ICD-10-CM | POA: Diagnosis not present

## 2022-07-23 DIAGNOSIS — Z9049 Acquired absence of other specified parts of digestive tract: Secondary | ICD-10-CM

## 2022-07-23 DIAGNOSIS — I482 Chronic atrial fibrillation, unspecified: Secondary | ICD-10-CM | POA: Diagnosis not present

## 2022-07-23 DIAGNOSIS — J90 Pleural effusion, not elsewhere classified: Secondary | ICD-10-CM | POA: Diagnosis not present

## 2022-07-23 HISTORY — DX: Unspecified atrial fibrillation: I48.91

## 2022-07-23 LAB — CBC WITH DIFFERENTIAL/PLATELET
Abs Immature Granulocytes: 0.2 10*3/uL — ABNORMAL HIGH (ref 0.00–0.07)
Basophils Absolute: 0 10*3/uL (ref 0.0–0.1)
Basophils Relative: 0 %
Eosinophils Absolute: 0 10*3/uL (ref 0.0–0.5)
Eosinophils Relative: 0 %
HCT: 18.7 % — ABNORMAL LOW (ref 39.0–52.0)
Hemoglobin: 6.9 g/dL — CL (ref 13.0–17.0)
Immature Granulocytes: 2 %
Lymphocytes Relative: 2 %
Lymphs Abs: 0.2 10*3/uL — ABNORMAL LOW (ref 0.7–4.0)
MCH: 40.6 pg — ABNORMAL HIGH (ref 26.0–34.0)
MCHC: 36.9 g/dL — ABNORMAL HIGH (ref 30.0–36.0)
MCV: 110 fL — ABNORMAL HIGH (ref 80.0–100.0)
Monocytes Absolute: 1.8 10*3/uL — ABNORMAL HIGH (ref 0.1–1.0)
Monocytes Relative: 19 %
Neutro Abs: 7.5 10*3/uL (ref 1.7–7.7)
Neutrophils Relative %: 77 %
Platelets: 118 10*3/uL — ABNORMAL LOW (ref 150–400)
RBC: 1.7 MIL/uL — ABNORMAL LOW (ref 4.22–5.81)
RDW: 18.2 % — ABNORMAL HIGH (ref 11.5–15.5)
WBC: 9.7 10*3/uL (ref 4.0–10.5)
nRBC: 1.3 % — ABNORMAL HIGH (ref 0.0–0.2)

## 2022-07-23 LAB — CBG MONITORING, ED
Glucose-Capillary: 43 mg/dL — CL (ref 70–99)
Glucose-Capillary: 69 mg/dL — ABNORMAL LOW (ref 70–99)
Glucose-Capillary: 96 mg/dL (ref 70–99)

## 2022-07-23 LAB — COMPREHENSIVE METABOLIC PANEL WITH GFR
ALT: 18 U/L (ref 0–44)
AST: 40 U/L (ref 15–41)
Albumin: 2.9 g/dL — ABNORMAL LOW (ref 3.5–5.0)
Alkaline Phosphatase: 155 U/L — ABNORMAL HIGH (ref 38–126)
Anion gap: 28 — ABNORMAL HIGH (ref 5–15)
BUN: 38 mg/dL — ABNORMAL HIGH (ref 8–23)
CO2: 13 mmol/L — ABNORMAL LOW (ref 22–32)
Calcium: 7.4 mg/dL — ABNORMAL LOW (ref 8.9–10.3)
Chloride: 98 mmol/L (ref 98–111)
Creatinine, Ser: 5.82 mg/dL — ABNORMAL HIGH (ref 0.61–1.24)
GFR, Estimated: 10 mL/min — ABNORMAL LOW
Glucose, Bld: 34 mg/dL — CL (ref 70–99)
Potassium: 3.9 mmol/L (ref 3.5–5.1)
Sodium: 139 mmol/L (ref 135–145)
Total Bilirubin: 2.9 mg/dL — ABNORMAL HIGH (ref 0.3–1.2)
Total Protein: 6.6 g/dL (ref 6.5–8.1)

## 2022-07-23 MED ORDER — OXYCODONE HCL 10 MG PO TABS: 10.0000 mg | ORAL_TABLET | Freq: Four times a day (QID) | ORAL | 0 refills | Status: DC | PRN

## 2022-07-23 MED ORDER — TIZANIDINE HCL 4 MG PO TABS: ORAL_TABLET | ORAL | 2 refills | Status: DC

## 2022-07-23 MED ORDER — DEXTROSE 50 % IV SOLN
12.5000 g | Freq: Once | INTRAVENOUS | Status: DC
  Filled 2022-07-23: qty 50

## 2022-07-23 MED ORDER — SODIUM CHLORIDE 0.9 % IV SOLN
1.0000 g | Freq: Once | INTRAVENOUS | Status: AC
  Administered 2022-07-23: 1 g via INTRAVENOUS
  Filled 2022-07-23: qty 10

## 2022-07-23 MED ORDER — VANCOMYCIN HCL 1250 MG/250ML IV SOLN
1250.0000 mg | Freq: Once | INTRAVENOUS | Status: AC
  Administered 2022-07-23: 1250 mg via INTRAVENOUS
  Filled 2022-07-23: qty 250

## 2022-07-23 MED ORDER — TIZANIDINE HCL 4 MG PO TABS
2.0000 mg | ORAL_TABLET | Freq: Once | ORAL | Status: AC
  Administered 2022-07-23: 2 mg via ORAL
  Filled 2022-07-23: qty 1

## 2022-07-23 NOTE — Assessment & Plan Note (Signed)
Chronic cont home meds

## 2022-07-23 NOTE — Assessment & Plan Note (Signed)
Sent page to Dr. Hollie Salk that pt has been admitted will need HD in AM Continue home torsemide  if bp allows

## 2022-07-23 NOTE — ED Notes (Signed)
Pt ambulated to the bathroom with a steady gait.

## 2022-07-23 NOTE — Assessment & Plan Note (Signed)
Continue oxycodeon prn

## 2022-07-23 NOTE — ED Notes (Signed)
Notified Dr. Vanita Panda of critical glucose, patient is alert, talking and reports no Sx of hypoglycemia, patient is able to drink oral fluids at this time, Dr. Vanita Panda ordered to recheck POC glucose in 15  mins

## 2022-07-23 NOTE — Assessment & Plan Note (Signed)
-   will monitor on tele avoid QT prolonging medications, rehydrate correct electrolytes ? ?

## 2022-07-23 NOTE — H&P (Signed)
Patrick [name] LXB:[id number] DOB: 1956-06-04 DOA: 07/23/2022     PCP: [name], MD   Outpatient Specialists:   CARDS: Dr. Jeffie Pollock NEphrology:  Dr.[place] [facility] Dr. Lennie Hummer  Patient arrived to ER on 07/23/22 at 1636 Referred by Attending Carmin Muskrat, MD   Patient coming from:    home Lives alone,  Chief Complaint:  Chief Complaint  Patient presents with   Shortness of Breath   Weight Gain    HPI: Patrick [name] is a 67 y.o. male with medical history significant of ESRD on HD T H sat, sickle cell anemia, p A.fib, Chronic systolic CHF, HTN, COPD  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 05/24/2021 COPD  gallbladder empyema status post cholecystostomy, status post biliary stent,  prolonged QT, gout,   Presented with  DOE Pt w hx of Sickle cell disease,ESRD on HD T H sat Left hD yearly on sdaturday and stopped taking his meds  Have gained some weight since Now with increased fatigue and SOB Noted to be hypoglycemic in ER Hg down to 6.9   Does not smoke or drink  Has not been taking his meds for the past 3 days  Have not eaten well  No constipation  No CP    Still makes urine  Deneis any fever or chills no cough   Regarding pertinent Chronic problems:      HTN on torsemide , have not had since friday   chronic CHF  systolic/   last echo Dec 2023 EF 35-40%        A. Fib -  - CHA2DS2 vas score  2      Not on anticoagulation secondary to Risk of Falls,   recurrent bleeding     ESRD on HD T, H sat  Lab Results  Component Value Date   CREATININE 5.82 (H) 07/23/2022   CREATININE 7.50 (H) 07/02/2022   CREATININE 4.22 (H) 06/09/2022       Chronic anemia - baseline hg Hemoglobin & Hematocrit  Recent Labs    06/09/22 0930 07/02/22 0712 07/23/22 1813  HGB 7.1* 9.2* 6.9*     While in ER:    Noted to be hypoglycemia and CXR with increased fluid   Ordered    CXR - . Cardiomegaly with central vascular  congestion.   Small right pleural effusion and right lung base atelectasis or infiltrate.      Following Medications were ordered in ER: Medications  vancomycin (VANCOREADY) IVPB 1250 mg/250 mL (has no administration in time range)  tiZANidine (ZANAFLEX) tablet 2 mg (2 mg Oral Given 07/23/22 2103)  ceFEPIme (MAXIPIME) 1 g in sodium chloride 0.9 % 100 mL IVPB (1 g Intravenous New Bag/Given 07/23/22 2109)    ____________  ED Triage Vitals  Enc Vitals Group     BP 07/23/22 1640 (!) 133/96     Pulse Rate 07/23/22 1642 (!) 114     Resp 07/23/22 1640 (!) 21     Temp 07/23/22 1637 (S) (!) 95.9 F (35.5 C)     Temp Source 07/23/22 1637 Oral     SpO2 07/23/22 1642 98 %     Weight 07/23/22 2112 146 lb (66.2 kg)     Height 07/23/22 2112 5\' 11"  (1.803 m)     Head Circumference --      Peak Flow --      Pain Score 07/23/22 1646 0     Pain Loc --  Pain Edu? --      Excl. in [place]? --   TMAX(24)@     _________________________________________ Significant initial  Findings: Abnormal Labs Reviewed  COMPREHENSIVE METABOLIC PANEL - Abnormal; Notable for the following components:      Result Value   CO2 13 (*)    Glucose, Bld 34 (*)    BUN 38 (*)    Creatinine, Ser 5.82 (*)    Calcium 7.4 (*)    Albumin 2.9 (*)    Alkaline Phosphatase 155 (*)    Total Bilirubin 2.9 (*)    GFR, Estimated 10 (*)    Anion gap 28 (*)    All other components within normal limits  CBC WITH DIFFERENTIAL/PLATELET - Abnormal; Notable for the following components:   RBC 1.70 (*)    Hemoglobin 6.9 (*)    HCT 18.7 (*)    MCV 110.0 (*)    MCH 40.6 (*)    MCHC 36.9 (*)    RDW 18.2 (*)    Platelets 118 (*)    nRBC 1.3 (*)    Lymphs Abs 0.2 (*)    Monocytes Absolute 1.8 (*)    Abs Immature Granulocytes 0.20 (*)    All other components within normal limits  CBG MONITORING, ED - Abnormal; Notable for the following components:   Glucose-Capillary 43 (*)    All other components within normal limits  CBG  MONITORING, ED - Abnormal; Notable for the following components:   Glucose-Capillary 69 (*)    All other components within normal limits       ECG: Ordered Personally reviewed and interpreted by me showing: HR : 122 Rhythm:   Atrial fibrillation Right axis deviation Abnormal R-wave progression, late transition Borderline T wave abnormalities Prolonged QT interval Abnormal ECG  QTC 563   ____________________ This patient meets SIRS Criteria and may be septic.    The recent clinical data is shown below. Vitals:   07/23/22 1830 07/23/22 1915 07/23/22 1930 07/23/22 2112  BP: (!) 140/76 137/82 (!) 129/52 123/83  Pulse: (!) 105   92  Resp: 17 (!) 21 (!) 21 18  Temp:    (!) 97.5 F (36.4 C)  TempSrc:    Oral  SpO2: 97%   97%  Weight:    66.2 kg  Height:    5\' 11"  (1.803 m)       WBC     Component Value Date/Time   WBC 9.7 07/23/2022 1813   LYMPHSABS 0.2 (L) 07/23/2022 1813   LYMPHSABS 0.4 (L) 11/28/2021 1026   MONOABS 1.8 (H) 07/23/2022 1813   EOSABS 0.0 07/23/2022 1813   EOSABS 0.1 11/28/2021 1026   BASOSABS 0.0 07/23/2022 1813   BASOSABS 0.0 11/28/2021 1026       UA ordered     Results for orders placed or performed during the hospital encounter of 07/04/22  Culture, body fluid w Gram Stain-bottle     Status: None   Collection Time: 07/04/22 11:16 AM   Specimen: Fluid  Result Value Ref Range Status   Specimen Description FLUID ABDOMEN  Final   Special Requests BOTTLES DRAWN AEROBIC AND ANAEROBIC  Final   Culture   Final    NO GROWTH 5 DAYS Performed at [facility], [place] [address] [place],  [address]    Report Status 07/09/2022 FINAL  Final  Gram stain     Status: None   Collection Time: 07/04/22 11:16 AM   Specimen: Fluid  Result Value Ref Range  Status   Specimen Description FLUID ABDOMEN  Final   Special Requests NONE  Final   Gram Stain   Final    WBC PRESENT, PREDOMINANTLY MONONUCLEAR NO ORGANISMS SEEN CYTOSPIN SMEAR Performed at [facility], [place] [address] [place], [place] [address]    Report Status 07/04/2022 FINAL  Final   *Note: Due to a large number of results and/or encounters for the requested time period, some results have not been displayed. A complete set of results can be found in Results Review.     _______________________________________________ Hospitalist was called for admission for fluid overload   The following Work up has been ordered so far:  Orders Placed This Encounter  Procedures   MRSA Next Gen by PCR, Nasal   DG Chest 2 View   Comprehensive metabolic panel   CBC with Differential   Cardiac monitoring   If O2 Sat <94% administer O2 at 2 liters/minute via nasal cannula   vancomycin per pharmacy consult   Consult to hospitalist   Pulse oximetry, continuous   CBG monitoring, ED   CBG monitoring, ED   EKG 12-Lead   ED EKG     OTHER Significant initial  Findings:  labs showing:    Recent Labs  Lab 07/23/22 1813  NA 139  K 3.9  CO2 13*  GLUCOSE 34*  BUN 38*  CREATININE 5.82*  CALCIUM 7.4*    Cr   stable,  Lab Results  Component Value Date   CREATININE 5.82 (H) 07/23/2022   CREATININE 7.50 (H) 07/02/2022   CREATININE 4.22 (H) 06/09/2022    Recent Labs  Lab 07/23/22 1813  AST 40  ALT 18  ALKPHOS 155*  BILITOT 2.9*  PROT 6.6  ALBUMIN 2.9*   Lab Results  Component Value Date   CALCIUM 7.4 (L) 07/23/2022   PHOS 3.3 06/09/2022          Plt: Lab Results  Component Value Date   PLT 118 (L) 07/23/2022     COVID-19 Labs  No results for input(s): "DDIMER", "FERRITIN", "LDH", "CRP" in the last 72 hours.  Lab Results  Component Value Date   SARSCOV2NAA NEGATIVE 06/07/2022   SARSCOV2NAA NEGATIVE 03/31/2022   SARSCOV2NAA NEGATIVE 08/22/2021   [place] NEGATIVE 07/31/2021       Recent Labs  Lab 07/23/22 1813  WBC 9.7  NEUTROABS 7.5  HGB 6.9*  HCT 18.7*  MCV 110.0*  PLT 118*    HG/HCT  Down from baseline see below    Component Value  Date/Time   HGB 6.9 (LL) 07/23/2022 1813   HGB 7.8 (L) 04/17/2022 1505   HCT 18.7 (L) 07/23/2022 1813   HCT 22.6 (L) 04/17/2022 1505   MCV 110.0 (H) 07/23/2022 1813   MCV 101 (H) 04/17/2022 1505    BNP (last 3 results) Recent Labs    03/31/22 1427  BNP 3,312.5*     DM  labs:  HbA1C: No results for input(s): "HGBA1C" in the last 8760 hours.     CBG (last 3)  Recent Labs    07/23/22 2000 07/23/22 2029  GLUCAP 43* 69*    Cultures:    Component Value Date/Time   SDES FLUID ABDOMEN 07/04/2022 1116   SDES FLUID ABDOMEN 07/04/2022 1116   SPECREQUEST BOTTLES DRAWN AEROBIC AND ANAEROBIC 07/04/2022 1116   SPECREQUEST NONE 07/04/2022 1116   CULT  07/04/2022 1116    NO GROWTH 5 DAYS Performed at [facility], [place] [address], [place], [place] [address]  REPTSTATUS 07/09/2022 FINAL 07/04/2022 1116   REPTSTATUS 07/04/2022 FINAL 07/04/2022 1116     Radiological Exams on Admission: DG Chest 2 View  Result Date: 07/23/2022 CLINICAL DATA:  Shortness of breath. EXAM: CHEST - 2 VIEW COMPARISON:  Chest radiograph dated 06/07/2022. FINDINGS: Cardiomegaly with central vascular congestion. Small right pleural effusion and right lung base atelectasis or infiltrate, new since the prior radiograph. No pneumothorax. No acute osseous pathology. IMPRESSION: 1. Cardiomegaly with central vascular congestion. 2. Small right pleural effusion and right lung base atelectasis or infiltrate. Follow-up recommended. Electronically Signed   By: Anner Crete M.D.   On: 07/23/2022 17:13   _______________________________________________________________________________________________________ Latest  Blood pressure 123/83, pulse 92, temperature (!) 97.5 F (36.4 C), temperature source Oral, resp. rate 18, height 5\' 11"  (1.803 m), weight 66.2 kg, SpO2 97 %.   Vitals  labs and radiology finding personally reviewed  Review of Systems:    Pertinent positives include:  fatigue  shortness of breath at  rest.   dyspnea on exertion, Constitutional:  No weight loss, night sweats, Fevers, chills, , weight loss  HEENT:  No headaches, Difficulty swallowing,Tooth/dental problems,Sore throat,  No sneezing, itching, ear ache, nasal congestion, post nasal drip,  Cardio-vascular:  No chest pain, Orthopnea, PND, anasarca, dizziness, palpitations.no Bilateral lower extremity swelling  GI:  No heartburn, indigestion, abdominal pain, nausea, vomiting, diarrhea, change in bowel habits, loss of appetite, melena, blood in stool, hematemesis Resp:  no  No excess mucus, no productive cough, No non-productive cough, No coughing up of blood.No change in color of mucus.No wheezing. Skin:  no rash or lesions. No jaundice GU:  no dysuria, change in color of urine, no urgency or frequency. No straining to urinate.  No flank pain.  Musculoskeletal:  No joint pain or no joint swelling. No decreased range of motion. No back pain.  Psych:  No change in mood or affect. No depression or anxiety. No memory loss.  Neuro: no localizing neurological complaints, no tingling, no weakness, no double vision, no gait abnormality, no slurred speech, no confusion  All systems reviewed and apart from [place] all are negative _______________________________________________________________________________________________ Past Medical History:   Past Medical History:  Diagnosis Date   A-fib ([place])    Blood dyscrasia    Blood transfusion    "I've had a bunch of them"   CHF (congestive heart failure) ([place])    followed by hf clinic   Chronic kidney disease    Stage 5, dialysis on Tu/Th/Sa   Dialysis patient ([place]) 04/2019   AV fistula (right arm), TTS   Dysrhythmia    A-Fib per patient   Early satiety    Elevated LFTs 04/01/2022   Elevated troponin 04/01/2022   Empyema of gallbladder 05/27/2021   GERD (gastroesophageal reflux disease)    Gout    Legally blind    Metabolic acidosis with increased anion gap and  accumulation of organic acids 04/01/2022   Mitral regurgitation    Muscle tightness-Right arm  05/27/2014   Pneumonia    Pulmonary hypertension (HCC)    Shortness of breath dyspnea    Sickle cell disease, type SS (HCC)    Swelling abdomen    Swelling of extremity, left    Swelling of extremity, right    Tricuspid regurgitation       Past Surgical History:  Procedure Laterality Date   A/V FISTULAGRAM Right 07/29/2020   Procedure: A/V FISTULAGRAM - Right Arm;  Surgeon: Angelia Mould, MD;  Location: [facility];  Service: Cardiovascular;  Laterality: Right;   BASCILIC VEIN TRANSPOSITION Right 04/12/2014   Procedure: BASILIC VEIN TRANSPOSITION;  Surgeon: Elam Dutch, MD;  Location: [place];  Service: Vascular;  Laterality: Right;   BILIARY STENT PLACEMENT  04/04/2022   Procedure: BILIARY STENT PLACEMENT;  Surgeon: Ladene Artist, MD;  Location: [place];  Service: Gastroenterology;;   BIOPSY  08/04/2020   Procedure: BIOPSY;  Surgeon: Ladene Artist, MD;  Location: [place];  Service: Gastroenterology;;   BIOPSY  06/28/2021   Procedure: BIOPSY;  Surgeon: Jerene Bears, MD;  Location: [place];  Service: Gastroenterology;;   BIOPSY  04/02/2022   Procedure: BIOPSY;  Surgeon: Irving Copas., MD;  Location: [place];  Service: Gastroenterology;;   BOWEL RESECTION N/A 05/24/2021   Procedure: SMALL BOWEL RESECTION;  Surgeon: Rolm Bookbinder, MD;  Location: [place];  Service: General;  Laterality: N/A;   CARDIOVERSION N/A 06/05/2018   Procedure: CARDIOVERSION;  Surgeon: Jolaine Artist, MD;  Location: [place];  Service: Cardiovascular;  Laterality: N/A;   CARDIOVERSION N/A 10/06/2019   Procedure: CARDIOVERSION;  Surgeon: Jolaine Artist, MD;  Location: [facility] ENDOSCOPY;  Service: Cardiovascular;  Laterality: N/A;   CHOLECYSTECTOMY N/A 05/24/2021   Procedure: OPEN CHOLECYSTECTOMY;  Surgeon: Rolm Bookbinder, MD;  Location: [place];   Service: General;  Laterality: N/A;   COLONOSCOPY WITH PROPOFOL N/A 09/24/2017   Procedure: COLONOSCOPY WITH PROPOFOL;  Surgeon: Jackquline Denmark, MD;  Location: [place];  Service: Endoscopy;  Laterality: N/A;   ENDOSCOPIC RETROGRADE CHOLANGIOPANCREATOGRAPHY (ERCP) WITH PROPOFOL N/A 05/17/2022   Procedure: ENDOSCOPIC RETROGRADE CHOLANGIOPANCREATOGRAPHY (ERCP) WITH PROPOFOL;  Surgeon: Rush Landmark Telford Nab., MD;  Location: [facility];  Service: Gastroenterology;  Laterality: N/A;   ERCP N/A 04/04/2022   Procedure: ENDOSCOPIC RETROGRADE CHOLANGIOPANCREATOGRAPHY (ERCP);  Surgeon: Ladene Artist, MD;  Location: [place];  Service: Gastroenterology;  Laterality: N/A;   ESOPHAGOGASTRODUODENOSCOPY N/A 09/19/2017   Procedure: ESOPHAGOGASTRODUODENOSCOPY (EGD);  Surgeon: Jackquline Denmark, MD;  Location: [facility] ENDOSCOPY;  Service: Endoscopy;  Laterality: N/A;   ESOPHAGOGASTRODUODENOSCOPY N/A 05/29/2021   Procedure: ESOPHAGOGASTRODUODENOSCOPY (EGD);  Surgeon: Milus Banister, MD;  Location: [facility] ENDOSCOPY;  Service: Gastroenterology;  Laterality: N/A;   ESOPHAGOGASTRODUODENOSCOPY (EGD) WITH PROPOFOL N/A 08/04/2020   Procedure: ESOPHAGOGASTRODUODENOSCOPY (EGD) WITH PROPOFOL;  Surgeon: Ladene Artist, MD;  Location: [place];  Service: Gastroenterology;  Laterality: N/A;   ESOPHAGOGASTRODUODENOSCOPY (EGD) WITH PROPOFOL N/A 06/28/2021   Procedure: ESOPHAGOGASTRODUODENOSCOPY (EGD) WITH PROPOFOL;  Surgeon: Jerene Bears, MD;  Location: [facility] ENDOSCOPY;  Service: Gastroenterology;  Laterality: N/A;   ESOPHAGOGASTRODUODENOSCOPY (EGD) WITH PROPOFOL N/A 08/10/2021   Procedure: ESOPHAGOGASTRODUODENOSCOPY (EGD) WITH PROPOFOL;  Surgeon: Jackquline Denmark, MD;  Location: [place];  Service: Gastroenterology;  Laterality: N/A;   ESOPHAGOGASTRODUODENOSCOPY (EGD) WITH PROPOFOL N/A 08/02/2021   Procedure: ESOPHAGOGASTRODUODENOSCOPY (EGD) WITH PROPOFOL;  Surgeon: Lavena Bullion, DO;  Location: [place];  Service:  Gastroenterology;  Laterality: N/A;   ESOPHAGOGASTRODUODENOSCOPY (EGD) WITH PROPOFOL N/A 04/02/2022   Procedure: ESOPHAGOGASTRODUODENOSCOPY (EGD) WITH PROPOFOL;  Surgeon: Rush Landmark Telford Nab., MD;  Location: [place];  Service: Gastroenterology;  Laterality: N/A;   ESOPHAGOGASTRODUODENOSCOPY (EGD) WITH PROPOFOL N/A 05/17/2022   Procedure: ESOPHAGOGASTRODUODENOSCOPY (EGD) WITH PROPOFOL;  Surgeon: Rush Landmark Telford Nab., MD;  Location[facility];  Service: Gastroenterology;  Laterality: N/A;   EUS N/A 05/17/2022   Procedure: UPPER ENDOSCOPIC ULTRASOUND (EUS) RADIAL;  Surgeon: Irving Copas., MD;  Location: [facility];  Service: Gastroenterology;  Laterality: N/A;   FINE NEEDLE ASPIRATION  04/02/2022   Procedure: FINE NEEDLE ASPIRATION (FNA)  LINEAR;  Surgeon: Rush Landmark Telford Nab., MD;  Location: [place];  Service: Gastroenterology;;   FINE NEEDLE ASPIRATION N/A 05/17/2022   Procedure: FINE NEEDLE ASPIRATION (FNA) LINEAR;  Surgeon: Irving Copas., MD;  Location: [facility];  Service: Gastroenterology;  Laterality: N/A;   HEMOSTASIS CLIP PLACEMENT  05/29/2021   Procedure: HEMOSTASIS CLIP PLACEMENT;  Surgeon: Milus Banister, MD;  Location: [facility] ENDOSCOPY;  Service: Gastroenterology;;   HOT HEMOSTASIS N/A 05/29/2021   Procedure: HOT HEMOSTASIS (ARGON PLASMA COAGULATION/BICAP);  Surgeon: Milus Banister, MD;  Location: [facility] ENDOSCOPY;  Service: Gastroenterology;  Laterality: N/A;   HOT HEMOSTASIS N/A 06/28/2021   Procedure: HOT HEMOSTASIS (ARGON PLASMA COAGULATION/BICAP);  Surgeon: Jerene Bears, MD;  Location: [facility] ENDOSCOPY;  Service: Gastroenterology;  Laterality: N/A;   HOT HEMOSTASIS N/A 08/10/2021   Procedure: HOT HEMOSTASIS (ARGON PLASMA COAGULATION/BICAP);  Surgeon: Jackquline Denmark, MD;  Location: [facility] ENDOSCOPY;  Service: Gastroenterology;  Laterality: N/A;   HOT HEMOSTASIS N/A 08/02/2021   Procedure: HOT HEMOSTASIS (ARGON PLASMA COAGULATION/BICAP);  Surgeon:  Lavena Bullion, DO;  Location: [facility] ENDOSCOPY;  Service: Gastroenterology;  Laterality: N/A;   IR PARACENTESIS  07/04/2022   IR PERC TUN PERIT CATH WO PORT S&I /IMAG  05/23/2021   IR REMOVAL TUN CV CATH W/O FL  06/10/2021   IR US GUIDE VASC ACCESS RIGHT  05/23/2021   LAPAROSCOPY N/A 05/24/2021   Procedure: LAPAROSCOPY DIAGNOSTIC;  Surgeon: Rolm Bookbinder, MD;  Location: [place];  Service: General;  Laterality: N/A;   LAPAROTOMY N/A 05/24/2021   Procedure: EXPLORATORY LAPAROTOMY;  Surgeon: Rolm Bookbinder, MD;  Location: [place];  Service: General;  Laterality: N/A;   LEFT AND RIGHT HEART CATHETERIZATION WITH CORONARY ANGIOGRAM N/A 06/11/2011   Procedure: LEFT AND RIGHT HEART CATHETERIZATION WITH CORONARY ANGIOGRAM;  Surgeon: Larey Dresser, MD;  Location: [facility] CATH LAB;  Service: Cardiovascular;  Laterality: N/A;   PERIPHERAL VASCULAR BALLOON ANGIOPLASTY Right 07/29/2020   Procedure: PERIPHERAL VASCULAR BALLOON ANGIOPLASTY;  Surgeon: Angelia Mould, MD;  Location: [place] [facility];  Service: Cardiovascular;  Laterality: Right;  arm fistula   REMOVAL OF STONES  05/17/2022   Procedure: REMOVAL OF STONES;  Surgeon: Rush Landmark Telford Nab., MD;  Location: [facility];  Service: Gastroenterology;;   REVISON OF ARTERIOVENOUS FISTULA Right 08/08/2020   Procedure: ANEURYSM EXCISION OF RIGHT UPPER EXTREMITY ARTERIOVENOUS FISTULA;  Surgeon: Angelia Mould, MD;  Location: [place];  Service: Vascular;  Laterality: Right;   REVISON OF ARTERIOVENOUS FISTULA Right 03/21/2022   Procedure: EXCISION OF ULCERATED SKIN OF RIGHT ARTERIOVENOUS FISTULA REVISION;  Surgeon: Marty Heck, MD;  Location: [place];  Service: Vascular;  Laterality: Right;   REVISON OF ARTERIOVENOUS FISTULA Right 07/02/2022   Procedure: EXCISION OF ULCERS RIGHT ARTERIOVENOUS FISTULA;  Surgeon: Angelia Mould, MD;  Location: [place];  Service: Vascular;  Laterality: Right;   SCLEROTHERAPY  05/29/2021   Procedure:  Clide Deutscher;  Surgeon: Milus Banister, MD;  Location: [facility] ENDOSCOPY;  Service: Gastroenterology;;   Joan Mayans  04/04/2022   Procedure: SPHINCTEROTOMY;  Surgeon: Ladene Artist, MD;  Location: [facility] ENDOSCOPY;  Service: Gastroenterology;;   Lavell Islam REMOVAL  05/29/2021   Procedure: STENT REMOVAL;  Surgeon: Milus Banister, MD;  Location: [place];  Service: Gastroenterology;;   Lavell Islam REMOVAL  05/17/2022   Procedure: STENT REMOVAL;  Surgeon: Irving Copas., MD;  Location: [facility];  Service: Gastroenterology;;   TEE WITHOUT CARDIOVERSION N/A 12/27/2015   Procedure: TRANSESOPHAGEAL ECHOCARDIOGRAM (TEE);  Surgeon: Sueanne Margarita, MD;  Location: [facility] ENDOSCOPY;  Service: Cardiovascular;  Laterality: N/A;   UPPER ESOPHAGEAL ENDOSCOPIC ULTRASOUND (EUS) N/A 04/02/2022   Procedure: UPPER ESOPHAGEAL ENDOSCOPIC ULTRASOUND (EUS);  Surgeon: Irving Copas., MD;  Location: [place];  Service: Gastroenterology;  Laterality: N/A;    Social History:  Ambulatory       reports that he quit smoking about 6 years ago. His smoking use included cigarettes. He has a 20.50 pack-year smoking history. He has never used smokeless tobacco. He reports that he does not currently use alcohol after a past usage of about 3.0 standard drinks of alcohol per week. He reports that he does not use drugs.     Family History:   Family History  Problem Relation Age of Onset   Alcohol abuse Mother    Liver disease Mother    Cirrhosis Mother    Heart attack Mother    ______________________________________________________________________________________________ Allergies: No Known Allergies   Prior to Admission medications   Medication Sig Start Date End Date Taking? Authorizing Provider  acetaminophen (TYLENOL) 500 MG tablet Take 1,500 mg by mouth 3 (three) times daily as needed for mild pain, moderate pain, fever or headache.    [provider]  allopurinol (ZYLOPRIM) 100 MG  tablet TAKE 1 TABLET BY MOUTH EVERY DAY 07/02/22   Paseda, Dewaine Conger, FNP  AURYXIA 1 GM 210 MG(Fe) tablet Take 210-420 mg by mouth See admin instructions. 420 mg with every meal, 210 mg with each snack. 02/11/21   [provider]  cinacalcet (SENSIPAR) 30 MG tablet Take 1 tablet (30 mg total) by mouth Every Tuesday,Thursday,and Saturday with dialysis. 06/12/22   Shahmehdi, Valeria Batman, MD  diphenhydramine-acetaminophen (TYLENOL PM) 25-500 MG TABS tablet Take 2 tablets by mouth at bedtime as needed (sleep/pain).    [provider]  folic acid (FOLVITE) 1 MG tablet Take 1 tablet (1 mg total) by mouth daily. 06/04/22   Dorena Dew, FNP  hydroxyurea (HYDREA) 500 MG capsule Take 1 capsule (500 mg total) by mouth daily. 06/01/22   [name], MD  Iron-Vitamins (GERITOL PO) Take 15 mLs by mouth daily.    [provider]  lidocaine-prilocaine (EMLA) cream Apply 1 application. topically Every Tuesday,Thursday,and Saturday with dialysis. 09/08/19   [provider]  Oxycodone HCl 10 MG TABS Take 1 tablet (10 mg total) by mouth every 6 (six) hours as needed. 07/23/22   Dorena Dew, FNP  pantoprazole (PROTONIX) 40 MG tablet Take 1 tablet (40 mg total) by mouth daily. 05/17/22   Mansouraty, Telford Nab., MD  tiZANidine (ZANAFLEX) 4 MG tablet TAKE 1/2 TABLET (2MG  TOTAL) BY MOUTH EVERY 8 HOURS AS NEEDED FOR MUSCLE SPASM 07/23/22   Dorena Dew, FNP  torsemide (DEMADEX) 20 MG tablet Take 80 mg by mouth daily. 06/10/22   [provider]  torsemide 40 MG TABS Take 80 mg by mouth daily. 06/11/22 07/11/22  ShahmehdiValeria Batman, MD  Vitamin D, Ergocalciferol, (DRISDOL) 1.25 MG (50000 UNIT) CAPS capsule Take 1 capsule (50,000 Units total) by mouth once a week. Monday Patient taking differently: Take 50,000 Units by mouth every Monday. 04/08/22   Aline August, MD     ___________________________________________________________________________________________________ Physical Exam:    07/23/2022    9:12 PM 07/23/2022    7:30 PM 07/23/2022    7:15 PM  Vitals with BMI  Height 5\' 11"     Weight 146 lbs    BMI 70.96    Systolic 283 662 947  Diastolic 83 52 82  Pulse 92       1. General:  in No  Acute distress   Chronically ill   -appearing 2. Psychological: Alert and   Oriented 3. Head/ENT:   Moist  Mucous Membranes                          Head Non traumatic, neck supple                           Poor Dentition JVD noted 4. SKIN: normal   Skin turgor,  Skin clean Dry and intact no rash 5. Heart: Regular rate and rhythm  Murmur, no Rub or gallop 6. Lungs:  no wheezes some crackles   7. Abdomen: Soft,  Non distended   obese  bowel sounds present 8. Lower extremities: no clubbing, cyanosis, no  edema 9. Neurologically Grossly intact, moving all 4 extremities equally   10. MSK: Normal range of motion    Chart has been reviewed  ______________________________________________________________________________________________  Assessment/Plan  67 y.o. male with medical history significant of ESRD on HD T H sat, sickle cell anemia, p A.fib, Chronic systolic CHF, HTN, COPD  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 05/24/2021 COPD  gallbladder empyema status post cholecystostomy, status post biliary stent,  prolonged QT, gout,   Admitted for fluid overload, a.fib w rvr  Hypoglycemia , possible HCAP     Present on Admission:  Atrial fibrillation with RVR (HCC)  Sickle cell anemia (HCC)  Persistent atrial fibrillation (HCC)  Chronic systolic CHF (congestive heart failure) (HCC)  Prolonged QT interval  Essential hypertension  Gout  COPD (chronic obstructive pulmonary disease) ([place])  Chronic pain syndrome  Hypoglycemia  CAP (community acquired pneumonia)    Sickle cell anemia ([place]) Chronic continue hydroxyurea Baseline hg  around 7 Suspect slightly down as pt if fluid up  Hold off on blood transfusion for tonight and recheck after HD  Persistent atrial fibrillation (HCC) Has not tolerated rate control in the past due to hypotension Not on blood thinner due to history of bleeding  In the past developed a.fib in RVR when was fluid  up with PNA this improved when underlining issues were treated Would benefit from cardiology consult will email  ESRD on HD TTS Sent page to Dr. Hollie Salk that pt has been admitted will need HD in AM Continue home torsemide  if bp allows  Chronic systolic CHF (congestive heart failure) ([place]) Continue torsemide, HD in AM Sent page to dr. Hollie Salk  Prolonged QT interval - will monitor on tele avoid QT prolonging medications, rehydrate correct electrolytes   Essential hypertension Resume torsemide when BP allows, given hypotension will hold for tonight  Gout Continue allopurinol 167m g po q day  COPD (chronic obstructive pulmonary disease) ([employer]) Chronic cont home meds  Chronic pain syndrome Continue oxycodeon prn  Hypoglycemia Frequent CBG and will correct   CAP (community acquired pneumonia) Possible hCAP       will admit for treatment of hCAP will start on appropriate antibiotic coverage. - cefepime vanc   Obtain:  sputum cultures,                  Obtain respiratory panel                    influenza  pending  COVID  Pending but unlikely                  blood cultures and sputum cultures ordered                   strep pneumo UA antigen,                    check for Legionella antigen.                Provide oxygen as needed.    Other plan as per orders.  DVT prophylaxis:  SCD     Code Status:    Code Status: Prior FULL CODE  as per patient   I had personally discussed CODE STATUS with patient     Family Communication:   Family not at  Bedside    Disposition Plan:   To home once workup is complete and patient is stable   Following barriers  for discharge:                            Electrolytes corrected                               Anemia stable                              Pain controlled with PO medications                                                       Will need to be able to tolerate PO                                                Will need consultants to evaluate patient prior to discharge                     Would benefit from PT/OT eval prior to DC  Ordered                    Consults called: texted Nephrology that pt has been admitted   Admission status:  ED Disposition     ED Disposition  [place]: [facility] [[id number]]  Level of Care: Progressive [102]  Admit to Progressive based on following criteria: NEPHROLOGY stable condition requiring close monitoring for AKI, requiring Hemodialysis or Peritoneal Dialysis either from expected electrolyte imbalance, acidosis, or fluid overload that can be managed by NIPPV or high flow oxygen.  Admit to Progressive based on following criteria: MULTISYSTEM THREATS such as stable sepsis, metabolic/electrolyte imbalance with or without encephalopathy that is responding to early treatment.  May place patient in observation at [facility] or [place] if equivalent level of care is available:: No  Covid Evaluation: Asymptomatic - no recent exposure (last 10 days) testing not required  Diagnosis: Atrial fibrillation with RVR [facility]) [[id number]]  Admitting Physician: Toy Baker [[id number]]  Attending Physician: Toy Baker [[id number]]  Obs    Level of care     progressive tele indefinitely please discontinue once patient no longer qualifies COVID-19 Labs   Kathrina Crosley 07/24/2022, 1:06 AM    [facility]     after 2 AM please page floor coverage PA If 7AM-7PM, please contact the day team taking care of the patient using [email or web address]   Patient was evaluated in the context of the global  COVID-19 pandemic, which necessitated consideration that the patient might be at risk for infection with the SARS-CoV-2 virus that causes COVID-19. Institutional protocols and algorithms that pertain to the evaluation of patients at risk for COVID-19 are in a state of rapid change based on information released by regulatory bodies including the CDC and federal and state organizations. These policies and algorithms were followed during the patient's care.

## 2022-07-23 NOTE — Assessment & Plan Note (Addendum)
Chronic continue hydroxyurea Baseline hg around 7 Suspect slightly down as pt if fluid up  Hold off on blood transfusion for tonight and recheck after HD

## 2022-07-23 NOTE — ED Notes (Signed)
Hgb 6.9 Md notified.

## 2022-07-23 NOTE — ED Triage Notes (Signed)
Pt BIBGEMS from home for  SOB, back pain, abd distention. Dialysis tues, thurs, sat, has not missed any dialysis left sat  thirty minutes early.  Missed two days of meds  d/t no appetite. Gained 15 lbs  over one week  Home health nurse called ems for further evaluation  Hr 120-130s HX of afib

## 2022-07-23 NOTE — Assessment & Plan Note (Signed)
Has not tolerated rate control in the past due to hypotension Not on blood thinner due to history of bleeding  In the past developed a.fib in RVR when was fluid  up with PNA this improved when underlining issues were treated Would benefit from cardiology consult will email

## 2022-07-23 NOTE — ED Notes (Signed)
Reassessed patients blood glucose @2000 , results read 43. Dr.Lockwood aware. I gave the patient orange juice. Will reassess CBG @2015 .

## 2022-07-23 NOTE — Subjective & Objective (Signed)
Pt w hx of Sickle cell disease,ESRD on HD T H sat Left hD yearly on sdaturday and stopped taking his meds  Have gained some weight since Now with increased fatigue and SOB Noted to be hypoglycemic in ER Hg down to 6.9

## 2022-07-23 NOTE — Assessment & Plan Note (Signed)
Continue allopurinol 138m g po q day

## 2022-07-23 NOTE — Assessment & Plan Note (Signed)
Frequent CBG and will correct

## 2022-07-23 NOTE — ED Provider Notes (Signed)
[facility] Provider Note   CSN: [id number] Arrival date & time: 07/23/22  1636     History  Chief Complaint  Patient presents with   Shortness of Breath   Weight Gain    Rashed D Scoggin is a 67 y.o. male.  HPI Patient presents with dyspnea, fatigue, edema.  He notes that he had dialysis on Saturday, 2 days ago, but less than one third of his typical amount of fluid was removed.  He notes that since that time he has been progressively fatigued, with fluid retention, dyspnea, inability to perform ADL in a typical manner.  No fever, vomiting, chest pain    Home Medications Prior to Admission medications   Medication Sig Start Date End Date Taking? Authorizing Provider  acetaminophen (TYLENOL) 500 MG tablet Take 1,500 mg by mouth 3 (three) times daily as needed for mild pain, moderate pain, fever or headache.    [provider]  allopurinol (ZYLOPRIM) 100 MG tablet TAKE 1 TABLET BY MOUTH EVERY DAY 07/02/22   Paseda, Dewaine Conger, FNP  AURYXIA 1 GM 210 MG(Fe) tablet Take 210-420 mg by mouth See admin instructions. 420 mg with every meal, 210 mg with each snack. 02/11/21   [provider]  cinacalcet (SENSIPAR) 30 MG tablet Take 1 tablet (30 mg total) by mouth Every Tuesday,Thursday,and Saturday with dialysis. 06/12/22   Shahmehdi, Valeria Batman, MD  diphenhydramine-acetaminophen (TYLENOL PM) 25-500 MG TABS tablet Take 2 tablets by mouth at bedtime as needed (sleep/pain).    [provider]  folic acid (FOLVITE) 1 MG tablet Take 1 tablet (1 mg total) by mouth daily. 06/04/22   Dorena Dew, FNP  hydroxyurea (HYDREA) 500 MG capsule Take 1 capsule (500 mg total) by mouth daily. 06/01/22   [name], MD  Iron-Vitamins (GERITOL PO) Take 15 mLs by mouth daily.    [provider]  lidocaine-prilocaine (EMLA) cream Apply 1 application. topically Every Tuesday,Thursday,and Saturday with dialysis. 09/08/19    [provider]  Oxycodone HCl 10 MG TABS Take 1 tablet (10 mg total) by mouth every 6 (six) hours as needed. 07/23/22   Dorena Dew, FNP  pantoprazole (PROTONIX) 40 MG tablet Take 1 tablet (40 mg total) by mouth daily. 05/17/22   Mansouraty, Telford Nab., MD  tiZANidine (ZANAFLEX) 4 MG tablet TAKE 1/2 TABLET (2MG  TOTAL) BY MOUTH EVERY 8 HOURS AS NEEDED FOR MUSCLE SPASM 07/23/22   Dorena Dew, FNP  torsemide (DEMADEX) 20 MG tablet Take 80 mg by mouth daily. 06/10/22   [provider]  torsemide 40 MG TABS Take 80 mg by mouth daily. 06/11/22 07/11/22  ShahmehdiValeria Batman, MD  Vitamin D, Ergocalciferol, (DRISDOL) 1.25 MG (50000 UNIT) CAPS capsule Take 1 capsule (50,000 Units total) by mouth once a week. Monday Patient taking differently: Take 50,000 Units by mouth every Monday. 04/08/22   Aline August, MD      Allergies    Patient has no known allergies.    Review of Systems   Review of Systems  All other systems reviewed and are negative.   Physical Exam Updated Vital Signs BP (!) 147/79   Pulse (!) 114   Temp (!) 95.9 F (35.5 C) (Axillary)   Resp 20   SpO2 98%  Physical Exam Vitals and nursing note reviewed.  Constitutional:      General: He is not in acute distress.    Appearance: He is well-developed.  HENT:  Head: Normocephalic and atraumatic.   Eyes:     Conjunctiva/sclera: Conjunctivae normal.  Cardiovascular:     Rate and Rhythm: Tachycardia present. Rhythm irregular.  Pulmonary:     Effort: Pulmonary effort is normal. No respiratory distress.     Breath sounds: No stridor. Decreased breath sounds present. No wheezing.  Abdominal:     General: There is no distension.  Musculoskeletal:     Right lower leg: Edema present.     Left lower leg: Edema present.  Skin:    General: Skin is warm and dry.  Neurological:     Mental Status: He is alert and oriented to person, place, and time.     ED Results / Procedures / Treatments    Labs (all labs ordered are listed, but only abnormal results are displayed) Labs Reviewed  COMPREHENSIVE METABOLIC PANEL - Abnormal; Notable for the following components:      Result Value   CO2 13 (*)    Glucose, Bld 34 (*)    BUN 38 (*)    Creatinine, Ser 5.82 (*)    Calcium 7.4 (*)    Albumin 2.9 (*)    Alkaline Phosphatase 155 (*)    Total Bilirubin 2.9 (*)    GFR, Estimated 10 (*)    Anion gap 28 (*)    All other components within normal limits  CBC WITH DIFFERENTIAL/PLATELET - Abnormal; Notable for the following components:   RBC 1.70 (*)    Hemoglobin 6.9 (*)    HCT 18.7 (*)    MCV 110.0 (*)    MCH 40.6 (*)    MCHC 36.9 (*)    RDW 18.2 (*)    Platelets 118 (*)    nRBC 1.3 (*)    Lymphs Abs 0.2 (*)    Monocytes Absolute 1.8 (*)    Abs Immature Granulocytes 0.20 (*)    All other components within normal limits  CBG MONITORING, ED - Abnormal; Notable for the following components:   Glucose-Capillary 43 (*)    All other components within normal limits  CBG MONITORING, ED - Abnormal; Notable for the following components:   Glucose-Capillary 69 (*)    All other components within normal limits    EKG EKG Interpretation  Date/Time:  Monday July 23 2022 16:49:19 EST Ventricular Rate:  122 PR Interval:    QRS Duration: 99 QT Interval:  395 QTC Calculation: 563 R Axis:   104 Text Interpretation: Atrial fibrillation Right axis deviation Abnormal R-wave progression, late transition Borderline T wave abnormalities Prolonged QT interval Abnormal ECG Confirmed by Carmin Muskrat [phone]) on 07/23/2022 5:24:50 PM  Radiology DG Chest 2 View  Result Date: 07/23/2022 CLINICAL DATA:  Shortness of breath. EXAM: CHEST - 2 VIEW COMPARISON:  Chest radiograph dated 06/07/2022. FINDINGS: Cardiomegaly with central vascular congestion. Small right pleural effusion and right lung base atelectasis or infiltrate, new since the prior radiograph. No pneumothorax. No acute osseous  pathology. IMPRESSION: 1. Cardiomegaly with central vascular congestion. 2. Small right pleural effusion and right lung base atelectasis or infiltrate. Follow-up recommended. Electronically Signed   By: Anner Crete M.D.   On: 07/23/2022 17:13    Procedures Procedures    Medications Ordered in ED Medications - No data to display  ED Course/ Medical Decision Making/ A&P                             Medical Decision Making With multiple medical issues including  CKD, sickle cell, hypertension presents with anasarca, fatigue, dyspnea.  Concern for fluid retention, infection, less likely bacteremia, sepsis, ACS, though these are considerations.  Amount and/or Complexity of Data Reviewed External Data Reviewed: notes.    Details: Ongoing efforts for end-stage renal disease Labs: ordered. Decision-making details documented in ED Course. Radiology: ordered and independent interpretation performed. Decision-making details documented in ED Course. ECG/medicine tests: ordered and independent interpretation performed. Decision-making details documented in ED Course.  Risk Prescription drug management. Decision regarding hospitalization.  Update: Patient found to be hypoglycemic, 43.  He is awake, alert, sitting upright. He received juice.  Update:, Patient had 1 additional hypoglycemia reading, but now after additional juice glucose is 69, he has had no substantial changes throughout. 8:45 PM Patient in similar condition, lab results available, x-ray results available.  This adult male with history of end-stage renal disease presents with dyspnea,, does have some evidence for fluid over status, but also has findings concerning for possible pneumonia with right-sided opacification, and hyperglycemia in spite of his not taking insulin products.  Has a sickle cell disease, hemoglobin 6.9 is consistent with multiple prior values for him, he is not actively bleeding, not a candidate for  transfusion.  Patient likely will require dialysis, tomorrow, but does not currently require additional oxygen, does not have substantial electrolyte abnormalities, but given his findings as above, recurrent hypoglycemia, pneumonia, immunocompromise status on dialysis, patient was admitted for further monitoring, management.        Final Clinical Impression(s) / ED Diagnoses Final diagnoses:  HCAP (healthcare-associated pneumonia)  Hypoglycemia   CRITICAL CARE Performed by: Carmin Muskrat Total critical care time: 35 minutes Critical care time was exclusive of separately billable procedures and treating other patients. Critical care was necessary to treat or prevent imminent or life-threatening deterioration. Critical care was time spent personally by me on the following activities: development of treatment plan with patient and/or surrogate as well as nursing, discussions with consultants, evaluation of patient's response to treatment, examination of patient, obtaining history from patient or surrogate, ordering and performing treatments and interventions, ordering and review of laboratory studies, ordering and review of radiographic studies, pulse oximetry and re-evaluation of patient's condition.    Carmin Muskrat, MD 07/23/22 2047

## 2022-07-23 NOTE — Assessment & Plan Note (Signed)
Appears euvolemic - Resume torsemide 

## 2022-07-23 NOTE — Assessment & Plan Note (Signed)
Continue torsemide, HD in AM Sent page to dr. Hollie Salk

## 2022-07-23 NOTE — Telephone Encounter (Signed)
Pt called and advised he needs a refill for the following medication. Please advise [facility]

## 2022-07-23 NOTE — Progress Notes (Signed)
Pharmacy Antibiotic Note  Patrick [name] is a 67 y.o. male for which pharmacy has been consulted for vancomycin dosing for pneumonia.  Patient with a history of AF, HF, ESRD on HD. Patient presenting with SOB.  WBC 9.7; T 95.9; HR 105; RR 21  Plan: Vancomycin 1250mg  once - repeat dosing per HD schedule Cefepime per MD Trend WBC, Fever, Renal function F/u cultures, clinical course, WBC De-escalate when able F/u Nephrology plan F/u MRSA PCR     Temp (24hrs), Avg:95.9 F (35.5 C), Min:95.9 F (35.5 C), Max:95.9 F (35.5 C)  Recent Labs  Lab 07/23/22 1813  WBC 9.7  CREATININE 5.82*    CrCl cannot be calculated (Unknown ideal weight.).    No Known Allergies  Microbiology results: Pending  Thank you for allowing pharmacy to be a part of this patient's care.  Lorelei Pont, PharmD, BCPS 07/23/2022 8:53 PM ED Clinical Pharmacist -  [phone]

## 2022-07-23 NOTE — Assessment & Plan Note (Signed)
Possible hCAP       will admit for treatment of hCAP will start on appropriate antibiotic coverage. - cefepime vanc   Obtain:  sputum cultures,                  Obtain respiratory panel                    influenza  pending                  COVID  Pending but unlikely                  blood cultures and sputum cultures ordered                   strep pneumo UA antigen,                    check for Legionella antigen.                Provide oxygen as needed.

## 2022-07-23 NOTE — Progress Notes (Signed)
Reviewed PDMP substance reporting system prior to prescribing opiate medications. No inconsistencies noted.  Meds ordered this encounter  Medications   Oxycodone HCl 10 MG TABS    Sig: Take 1 tablet (10 mg total) by mouth every 6 (six) hours as needed.    Dispense:  60 tablet    Refill:  0    Order Specific Question:   Supervising Provider    Answer:   [name] [[id number]]   tiZANidine (ZANAFLEX) 4 MG tablet    Sig: TAKE 1/2 TABLET (2MG  TOTAL) BY MOUTH EVERY 8 HOURS AS NEEDED FOR MUSCLE SPASM    Dispense:  30 tablet    Refill:  2    Order Specific Question:   Supervising Provider    Answer:   [name] [[id number]]   [name]  APRN, MSN, FNP-C [facility] [address] [place], [place] [address] [phone]

## 2022-07-24 DIAGNOSIS — H548 Legal blindness, as defined in USA: Secondary | ICD-10-CM | POA: Diagnosis present

## 2022-07-24 DIAGNOSIS — I5023 Acute on chronic systolic (congestive) heart failure: Secondary | ICD-10-CM | POA: Diagnosis present

## 2022-07-24 DIAGNOSIS — Z87891 Personal history of nicotine dependence: Secondary | ICD-10-CM | POA: Diagnosis not present

## 2022-07-24 DIAGNOSIS — E872 Acidosis, unspecified: Secondary | ICD-10-CM | POA: Diagnosis present

## 2022-07-24 DIAGNOSIS — D631 Anemia in chronic kidney disease: Secondary | ICD-10-CM | POA: Diagnosis present

## 2022-07-24 DIAGNOSIS — R0602 Shortness of breath: Secondary | ICD-10-CM | POA: Diagnosis present

## 2022-07-24 DIAGNOSIS — Y95 Nosocomial condition: Secondary | ICD-10-CM | POA: Diagnosis present

## 2022-07-24 DIAGNOSIS — Z992 Dependence on renal dialysis: Secondary | ICD-10-CM | POA: Diagnosis not present

## 2022-07-24 DIAGNOSIS — D571 Sickle-cell disease without crisis: Secondary | ICD-10-CM | POA: Diagnosis present

## 2022-07-24 DIAGNOSIS — J440 Chronic obstructive pulmonary disease with acute lower respiratory infection: Secondary | ICD-10-CM | POA: Diagnosis present

## 2022-07-24 DIAGNOSIS — E162 Hypoglycemia, unspecified: Secondary | ICD-10-CM | POA: Diagnosis present

## 2022-07-24 DIAGNOSIS — J189 Pneumonia, unspecified organism: Secondary | ICD-10-CM | POA: Diagnosis present

## 2022-07-24 DIAGNOSIS — I272 Pulmonary hypertension, unspecified: Secondary | ICD-10-CM | POA: Diagnosis present

## 2022-07-24 DIAGNOSIS — I132 Hypertensive heart and chronic kidney disease with heart failure and with stage 5 chronic kidney disease, or end stage renal disease: Secondary | ICD-10-CM | POA: Diagnosis present

## 2022-07-24 DIAGNOSIS — I959 Hypotension, unspecified: Secondary | ICD-10-CM | POA: Diagnosis not present

## 2022-07-24 DIAGNOSIS — Z811 Family history of alcohol abuse and dependence: Secondary | ICD-10-CM | POA: Diagnosis not present

## 2022-07-24 DIAGNOSIS — I4821 Permanent atrial fibrillation: Secondary | ICD-10-CM | POA: Diagnosis present

## 2022-07-24 DIAGNOSIS — Z9049 Acquired absence of other specified parts of digestive tract: Secondary | ICD-10-CM | POA: Diagnosis not present

## 2022-07-24 DIAGNOSIS — I4891 Unspecified atrial fibrillation: Secondary | ICD-10-CM | POA: Diagnosis not present

## 2022-07-24 DIAGNOSIS — Z8249 Family history of ischemic heart disease and other diseases of the circulatory system: Secondary | ICD-10-CM | POA: Diagnosis not present

## 2022-07-24 DIAGNOSIS — M109 Gout, unspecified: Secondary | ICD-10-CM | POA: Diagnosis present

## 2022-07-24 DIAGNOSIS — Z91148 Patient's other noncompliance with medication regimen for other reason: Secondary | ICD-10-CM | POA: Diagnosis not present

## 2022-07-24 DIAGNOSIS — I081 Rheumatic disorders of both mitral and tricuspid valves: Secondary | ICD-10-CM | POA: Diagnosis present

## 2022-07-24 DIAGNOSIS — G894 Chronic pain syndrome: Secondary | ICD-10-CM | POA: Diagnosis present

## 2022-07-24 DIAGNOSIS — N186 End stage renal disease: Secondary | ICD-10-CM | POA: Diagnosis present

## 2022-07-24 LAB — RENAL FUNCTION PANEL
Albumin: 3.2 g/dL — ABNORMAL LOW (ref 3.5–5.0)
Anion gap: 23 — ABNORMAL HIGH (ref 5–15)
BUN: 58 mg/dL — ABNORMAL HIGH (ref 8–23)
CO2: 17 mmol/L — ABNORMAL LOW (ref 22–32)
Calcium: 8.2 mg/dL — ABNORMAL LOW (ref 8.9–10.3)
Chloride: 89 mmol/L — ABNORMAL LOW (ref 98–111)
Creatinine, Ser: 7.2 mg/dL — ABNORMAL HIGH (ref 0.61–1.24)
GFR, Estimated: 8 mL/min — ABNORMAL LOW (ref >60)
Glucose, Bld: 83 mg/dL (ref 70–99)
Phosphorus: 6.8 mg/dL — ABNORMAL HIGH (ref 2.5–4.6)
Potassium: 5.6 mmol/L — ABNORMAL HIGH (ref 3.5–5.1)
Sodium: 129 mmol/L — ABNORMAL LOW (ref 135–145)

## 2022-07-24 LAB — COMPREHENSIVE METABOLIC PANEL
ALT: 27 U/L (ref 0–44)
AST: 47 U/L — ABNORMAL HIGH (ref 15–41)
Albumin: 3.2 g/dL — ABNORMAL LOW (ref 3.5–5.0)
Alkaline Phosphatase: 158 U/L — ABNORMAL HIGH (ref 38–126)
Anion gap: 24 — ABNORMAL HIGH (ref 5–15)
BUN: 53 mg/dL — ABNORMAL HIGH (ref 8–23)
CO2: 18 mmol/L — ABNORMAL LOW (ref 22–32)
Calcium: 8.2 mg/dL — ABNORMAL LOW (ref 8.9–10.3)
Chloride: 89 mmol/L — ABNORMAL LOW (ref 98–111)
Creatinine, Ser: 6.96 mg/dL — ABNORMAL HIGH (ref 0.61–1.24)
GFR, Estimated: 8 mL/min — ABNORMAL LOW (ref >60)
Glucose, Bld: 95 mg/dL (ref 70–99)
Potassium: 5.7 mmol/L — ABNORMAL HIGH (ref 3.5–5.1)
Sodium: 131 mmol/L — ABNORMAL LOW (ref 135–145)
Total Bilirubin: 3.3 mg/dL — ABNORMAL HIGH (ref 0.3–1.2)
Total Protein: 7.4 g/dL (ref 6.5–8.1)

## 2022-07-24 LAB — GLUCOSE, CAPILLARY
Glucose-Capillary: 73 mg/dL (ref 70–99)
Glucose-Capillary: 108 mg/dL — ABNORMAL HIGH (ref 70–99)
Glucose-Capillary: 87 mg/dL (ref 70–99)
Glucose-Capillary: 73 mg/dL (ref 70–99)
Glucose-Capillary: 73 mg/dL (ref 70–99)
Glucose-Capillary: 89 mg/dL (ref 70–99)

## 2022-07-24 LAB — PREALBUMIN: Prealbumin: 18 mg/dL (ref 18–38)

## 2022-07-24 LAB — MRSA NEXT GEN BY PCR, NASAL: MRSA by PCR Next Gen: NOT DETECTED

## 2022-07-24 LAB — CBC
HCT: 18.7 % — ABNORMAL LOW (ref 39.0–52.0)
Hemoglobin: 6.9 g/dL — CL (ref 13.0–17.0)
MCH: 39.4 pg — ABNORMAL HIGH (ref 26.0–34.0)
MCHC: 36.9 g/dL — ABNORMAL HIGH (ref 30.0–36.0)
MCV: 106.9 fL — ABNORMAL HIGH (ref 80.0–100.0)
Platelets: 131 10*3/uL — ABNORMAL LOW (ref 150–400)
RBC: 1.75 MIL/uL — ABNORMAL LOW (ref 4.22–5.81)
RDW: 17.3 % — ABNORMAL HIGH (ref 11.5–15.5)
WBC: 8.9 10*3/uL (ref 4.0–10.5)
nRBC: 2 % — ABNORMAL HIGH (ref 0.0–0.2)

## 2022-07-24 LAB — RESPIRATORY PANEL BY PCR

## 2022-07-24 LAB — HEPATITIS B SURFACE ANTIGEN
Hepatitis B Surface Ag: NONREACTIVE
Hepatitis B Surface Ag: NONREACTIVE

## 2022-07-24 LAB — LACTIC ACID, PLASMA
Lactic Acid, Venous: 5.7 mmol/L (ref 0.5–1.9)
Lactic Acid, Venous: 3.1 mmol/L (ref 0.5–1.9)

## 2022-07-24 LAB — MAGNESIUM
Magnesium: 2.1 mg/dL (ref 1.7–2.4)
Magnesium: 2.1 mg/dL (ref 1.7–2.4)

## 2022-07-24 LAB — CBG MONITORING, ED: Glucose-Capillary: 80 mg/dL (ref 70–99)

## 2022-07-24 LAB — PHOSPHORUS
Phosphorus: 6.8 mg/dL — ABNORMAL HIGH (ref 2.5–4.6)
Phosphorus: 6.9 mg/dL — ABNORMAL HIGH (ref 2.5–4.6)

## 2022-07-24 LAB — FERRITIN: Ferritin: 4104 ng/mL — ABNORMAL HIGH (ref 24–336)

## 2022-07-24 LAB — PREPARE RBC (CROSSMATCH)

## 2022-07-24 LAB — TSH: TSH: 7.919 u[IU]/mL — ABNORMAL HIGH (ref 0.350–4.500)

## 2022-07-24 LAB — CK: Total CK: 110 U/L (ref 49–397)

## 2022-07-24 LAB — VITAMIN B12: Vitamin B-12: 596 pg/mL (ref 180–914)

## 2022-07-24 LAB — FOLATE: Folate: 35.4 ng/mL (ref >5.9)

## 2022-07-24 LAB — IRON AND TIBC
Iron: 93 ug/dL (ref 45–182)
Saturation Ratios: 43 % — ABNORMAL HIGH (ref 17.9–39.5)
TIBC: 216 ug/dL — ABNORMAL LOW (ref 250–450)
UIBC: 123 ug/dL

## 2022-07-24 LAB — RETICULOCYTES
Immature Retic Fract: 34.1 % — ABNORMAL HIGH (ref 2.3–15.9)
RBC.: 1.75 MIL/uL — ABNORMAL LOW (ref 4.22–5.81)
Retic Count, Absolute: 40.6 10*3/uL (ref 19.0–186.0)
Retic Ct Pct: 2.3 % (ref 0.4–3.1)

## 2022-07-24 LAB — HIV ANTIBODY (ROUTINE TESTING W REFLEX): HIV Screen 4th Generation wRfx: NONREACTIVE

## 2022-07-24 LAB — PROCALCITONIN: Procalcitonin: 10.49 ng/mL

## 2022-07-24 MED ORDER — SODIUM CHLORIDE 0.9 % IV SOLN: 250.0000 mL | INTRAVENOUS | Status: DC | PRN

## 2022-07-24 MED ORDER — CINACALCET HCL 30 MG PO TABS: 30.0000 mg | ORAL_TABLET | ORAL | Status: DC

## 2022-07-24 MED ORDER — FERRIC CITRATE 1 GM 210 MG(FE) PO TABS
420.0000 mg | ORAL_TABLET | Freq: Three times a day (TID) | ORAL | Status: DC
  Administered 2022-07-24: 420 mg via ORAL
  Filled 2022-07-24: qty 2

## 2022-07-24 MED ORDER — DARBEPOETIN ALFA 200 MCG/0.4ML IJ SOSY
200.0000 ug | PREFILLED_SYRINGE | INTRAMUSCULAR | Status: DC
  Filled 2022-07-24: qty 0.4

## 2022-07-24 MED ORDER — ALLOPURINOL 100 MG PO TABS
100.0000 mg | ORAL_TABLET | Freq: Every day | ORAL | Status: DC
  Administered 2022-07-24 – 2022-07-27 (×4): 100 mg via ORAL
  Filled 2022-07-24 (×4): qty 1

## 2022-07-24 MED ORDER — ACETAMINOPHEN 325 MG PO TABS
650.0000 mg | ORAL_TABLET | Freq: Four times a day (QID) | ORAL | Status: DC | PRN
  Administered 2022-07-24 – 2022-07-26 (×4): 650 mg via ORAL
  Filled 2022-07-24 (×5): qty 2

## 2022-07-24 MED ORDER — DOXERCALCIFEROL 4 MCG/2ML IV SOLN
8.0000 ug | INTRAVENOUS | Status: DC
  Administered 2022-07-24 – 2022-07-26 (×2): 8 ug via INTRAVENOUS
  Filled 2022-07-24 (×3): qty 4

## 2022-07-24 MED ORDER — FERRIC CITRATE 1 GM 210 MG(FE) PO TABS: 210.0000 mg | ORAL_TABLET | ORAL | Status: DC

## 2022-07-24 MED ORDER — FERRIC CITRATE 1 GM 210 MG(FE) PO TABS
420.0000 mg | ORAL_TABLET | Freq: Three times a day (TID) | ORAL | Status: DC
  Administered 2022-07-25 – 2022-07-27 (×7): 420 mg via ORAL
  Filled 2022-07-24 (×8): qty 2

## 2022-07-24 MED ORDER — PANTOPRAZOLE SODIUM 40 MG PO TBEC
40.0000 mg | DELAYED_RELEASE_TABLET | Freq: Every day | ORAL | Status: DC
  Administered 2022-07-24 – 2022-07-27 (×4): 40 mg via ORAL
  Filled 2022-07-24 (×4): qty 1

## 2022-07-24 MED ORDER — CINACALCET HCL 30 MG PO TABS
30.0000 mg | ORAL_TABLET | ORAL | Status: DC
  Administered 2022-07-24 – 2022-07-26 (×2): 30 mg via ORAL
  Filled 2022-07-24 (×2): qty 1

## 2022-07-24 MED ORDER — OXYCODONE HCL 5 MG PO TABS
10.0000 mg | ORAL_TABLET | Freq: Four times a day (QID) | ORAL | Status: DC | PRN
  Administered 2022-07-24 – 2022-07-26 (×4): 10 mg via ORAL
  Filled 2022-07-24 (×5): qty 2

## 2022-07-24 MED ORDER — ACETAMINOPHEN 650 MG RE SUPP: 650.0000 mg | Freq: Four times a day (QID) | RECTAL | Status: DC | PRN

## 2022-07-24 MED ORDER — SODIUM ZIRCONIUM CYCLOSILICATE 10 G PO PACK
10.0000 g | PACK | ORAL | Status: AC
  Administered 2022-07-24: 10 g via ORAL
  Filled 2022-07-24: qty 1

## 2022-07-24 MED ORDER — VANCOMYCIN HCL 750 MG/150ML IV SOLN
750.0000 mg | INTRAVENOUS | Status: DC
  Administered 2022-07-24: 750 mg via INTRAVENOUS
  Filled 2022-07-24 (×2): qty 150

## 2022-07-24 MED ORDER — SODIUM CHLORIDE 0.9% IV SOLUTION: Freq: Once | INTRAVENOUS | Status: DC

## 2022-07-24 MED ORDER — HYDROMORPHONE HCL 1 MG/ML IJ SOLN
1.0000 mg | INTRAMUSCULAR | Status: DC | PRN
  Administered 2022-07-24 – 2022-07-27 (×13): 1 mg via INTRAVENOUS
  Filled 2022-07-24 (×13): qty 1

## 2022-07-24 MED ORDER — HYDROXYUREA 500 MG PO CAPS
500.0000 mg | ORAL_CAPSULE | Freq: Every day | ORAL | Status: DC
  Administered 2022-07-24 – 2022-07-27 (×4): 500 mg via ORAL
  Filled 2022-07-24 (×4): qty 1

## 2022-07-24 MED ORDER — SODIUM CHLORIDE 0.9% FLUSH
3.0000 mL | Freq: Two times a day (BID) | INTRAVENOUS | Status: DC
  Administered 2022-07-24 – 2022-07-27 (×7): 3 mL via INTRAVENOUS

## 2022-07-24 MED ORDER — OXYCODONE-ACETAMINOPHEN 5-325 MG PO TABS
1.0000 | ORAL_TABLET | Freq: Once | ORAL | Status: AC
  Administered 2022-07-24: 1 via ORAL
  Filled 2022-07-24: qty 1

## 2022-07-24 MED ORDER — CHLORHEXIDINE GLUCONATE CLOTH 2 % EX PADS
6.0000 | MEDICATED_PAD | Freq: Every day | CUTANEOUS | Status: DC
  Administered 2022-07-24 – 2022-07-27 (×4): 6 via TOPICAL

## 2022-07-24 MED ORDER — SODIUM CHLORIDE 0.9 % IV SOLN
1.0000 g | INTRAVENOUS | Status: DC
  Administered 2022-07-24 – 2022-07-25 (×2): 1 g via INTRAVENOUS
  Filled 2022-07-24: qty 10
  Filled 2022-07-24: qty 1
  Filled 2022-07-24: qty 10

## 2022-07-24 MED ORDER — SODIUM CHLORIDE 0.9% FLUSH: 3.0000 mL | INTRAVENOUS | Status: DC | PRN

## 2022-07-24 MED ORDER — FENTANYL CITRATE PF 50 MCG/ML IJ SOSY
25.0000 ug | PREFILLED_SYRINGE | INTRAMUSCULAR | Status: DC | PRN
  Administered 2022-07-24 (×2): 25 ug via INTRAVENOUS
  Filled 2022-07-24 (×2): qty 1

## 2022-07-24 NOTE — Progress Notes (Signed)
HD machine beeped continously,venous pressure were very high.RN. noted a soft sweiing on venous site.Hd stopped.Md paged,made aware of  venous site infiltration.Re-cannulated on different site.Hd treatment started again.

## 2022-07-24 NOTE — ED Notes (Signed)
ED TO INPATIENT HANDOFF REPORT  ED Nurse Name and Phone #: Caryl Pina RN [phone]  S Name/Age/Gender Saladin D Reedy 67 y.o. male Room/Bed: 004C/004C  Code Status   Code Status: Full Code  Home/SNF/Other Home Patient oriented to: self, place, time, and situation Is this baseline? Yes   Triage Complete: Triage complete  Chief Complaint Atrial fibrillation with RVR ([place]) [I48.91]  Triage Note Pt BIBGEMS from home for  SOB, back pain, abd distention. Dialysis tues, thurs, sat, has not missed any dialysis left sat  thirty minutes early.  Missed two days of meds  d/t no appetite. Gained 15 lbs  over one week  Home health nurse called ems for further evaluation  Hr 120-130s HX of afib   Allergies No Known Allergies  Level of Care/Admitting Diagnosis ED Disposition     ED Disposition  Admit   Condition  --   [place]: [place] [[id number]]  Level of Care: Progressive [102]  Admit to Progressive based on following criteria: NEPHROLOGY stable condition requiring close monitoring for AKI, requiring Hemodialysis or Peritoneal Dialysis either from expected electrolyte imbalance, acidosis, or fluid overload that can be managed by NIPPV or high flow oxygen.  Admit to Progressive based on following criteria: MULTISYSTEM THREATS such as stable sepsis, metabolic/electrolyte imbalance with or without encephalopathy that is responding to early treatment.  May place patient in observation at [facility] or [place] if equivalent level of care is available:: No  Covid Evaluation: Asymptomatic - no recent exposure (last 10 days) testing not required  Diagnosis: Atrial fibrillation with RVR [facility]) [[id number]]  Admitting Physician: [place], [place]  Attending Physician: Toy Baker [[id number]]          B Medical/Surgery History Past Medical History:  Diagnosis Date   A-fib ([place])    Blood dyscrasia    Blood transfusion    "I've had a  bunch of them"   CHF (congestive heart failure) ([place])    followed by hf clinic   Chronic kidney disease    Stage 5, dialysis on Tu/Th/Sa   Dialysis patient ([place]) 04/2019   AV fistula (right arm), TTS   Dysrhythmia    A-Fib per patient   Early satiety    Elevated LFTs 04/01/2022   Elevated troponin 04/01/2022   Empyema of gallbladder 05/27/2021   GERD (gastroesophageal reflux disease)    Gout    Legally blind    Metabolic acidosis with increased anion gap and accumulation of organic acids 04/01/2022   Mitral regurgitation    Muscle tightness-Right arm  05/27/2014   Pneumonia    Pulmonary hypertension (HCC)    Shortness of breath dyspnea    Sickle cell disease, type SS (HCC)    Swelling abdomen    Swelling of extremity, left    Swelling of extremity, right    Tricuspid regurgitation    Past Surgical History:  Procedure Laterality Date   A/V FISTULAGRAM Right 07/29/2020   Procedure: A/V FISTULAGRAM - Right Arm;  Surgeon: Angelia Mould, MD;  Location: [place] [facility];  Service: Cardiovascular;  Laterality: Right;   BASCILIC VEIN TRANSPOSITION Right 04/12/2014   Procedure: BASILIC VEIN TRANSPOSITION;  Surgeon: Elam Dutch, MD;  Location: [place];  Service: Vascular;  Laterality: Right;   BILIARY STENT PLACEMENT  04/04/2022   Procedure: BILIARY STENT PLACEMENT;  Surgeon: Ladene Artist, MD;  Location: [place];  Service: Gastroenterology;;   BIOPSY  08/04/2020   Procedure: BIOPSY;  Surgeon:  Ladene Artist, MD;  Location: [place];  Service: Gastroenterology;;   BIOPSY  06/28/2021   Procedure: BIOPSY;  Surgeon: Jerene Bears, MD;  Location: [place];  Service: Gastroenterology;;   BIOPSY  04/02/2022   Procedure: BIOPSY;  Surgeon: Irving Copas., MD;  Location: [place];  Service: Gastroenterology;;   BOWEL RESECTION N/A 05/24/2021   Procedure: SMALL BOWEL RESECTION;  Surgeon: Rolm Bookbinder, MD;  Location: [place];  Service: General;   Laterality: N/A;   CARDIOVERSION N/A 06/05/2018   Procedure: CARDIOVERSION;  Surgeon: Jolaine Artist, MD;  Location: [facility] ENDOSCOPY;  Service: Cardiovascular;  Laterality: N/A;   CARDIOVERSION N/A 10/06/2019   Procedure: CARDIOVERSION;  Surgeon: Jolaine Artist, MD;  Location: [facility] ENDOSCOPY;  Service: Cardiovascular;  Laterality: N/A;   CHOLECYSTECTOMY N/A 05/24/2021   Procedure: OPEN CHOLECYSTECTOMY;  Surgeon: Rolm Bookbinder, MD;  Location: [place];  Service: General;  Laterality: N/A;   COLONOSCOPY WITH PROPOFOL N/A 09/24/2017   Procedure: COLONOSCOPY WITH PROPOFOL;  Surgeon: Jackquline Denmark, MD;  Location: [place];  Service: Endoscopy;  Laterality: N/A;   ENDOSCOPIC RETROGRADE CHOLANGIOPANCREATOGRAPHY (ERCP) WITH PROPOFOL N/A 05/17/2022   Procedure: ENDOSCOPIC RETROGRADE CHOLANGIOPANCREATOGRAPHY (ERCP) WITH PROPOFOL;  Surgeon: Rush Landmark Telford Nab., MD;  Location: [facility] ENDOSCOPY;  Service: Gastroenterology;  Laterality: N/A;   ERCP N/A 04/04/2022   Procedure: ENDOSCOPIC RETROGRADE CHOLANGIOPANCREATOGRAPHY (ERCP);  Surgeon: Ladene Artist, MD;  Location: [place];  Service: Gastroenterology;  Laterality: N/A;   ESOPHAGOGASTRODUODENOSCOPY N/A 09/19/2017   Procedure: ESOPHAGOGASTRODUODENOSCOPY (EGD);  Surgeon: Jackquline Denmark, MD;  Location: [facility] ENDOSCOPY;  Service: Endoscopy;  Laterality: N/A;   ESOPHAGOGASTRODUODENOSCOPY N/A 05/29/2021   Procedure: ESOPHAGOGASTRODUODENOSCOPY (EGD);  Surgeon: Milus Banister, MD;  Location: [facility] ENDOSCOPY;  Service: Gastroenterology;  Laterality: N/A;   ESOPHAGOGASTRODUODENOSCOPY (EGD) WITH PROPOFOL N/A 08/04/2020   Procedure: ESOPHAGOGASTRODUODENOSCOPY (EGD) WITH PROPOFOL;  Surgeon: Ladene Artist, MD;  Location: [place];  Service: Gastroenterology;  Laterality: N/A;   ESOPHAGOGASTRODUODENOSCOPY (EGD) WITH PROPOFOL N/A 06/28/2021   Procedure: ESOPHAGOGASTRODUODENOSCOPY (EGD) WITH PROPOFOL;  Surgeon: Jerene Bears, MD;  Location: [facility]  ENDOSCOPY;  Service: Gastroenterology;  Laterality: N/A;   ESOPHAGOGASTRODUODENOSCOPY (EGD) WITH PROPOFOL N/A 08/10/2021   Procedure: ESOPHAGOGASTRODUODENOSCOPY (EGD) WITH PROPOFOL;  Surgeon: Jackquline Denmark, MD;  Location: [place];  Service: Gastroenterology;  Laterality: N/A;   ESOPHAGOGASTRODUODENOSCOPY (EGD) WITH PROPOFOL N/A 08/02/2021   Procedure: ESOPHAGOGASTRODUODENOSCOPY (EGD) WITH PROPOFOL;  Surgeon: Lavena Bullion, DO;  Location: [place];  Service: Gastroenterology;  Laterality: N/A;   ESOPHAGOGASTRODUODENOSCOPY (EGD) WITH PROPOFOL N/A 04/02/2022   Procedure: ESOPHAGOGASTRODUODENOSCOPY (EGD) WITH PROPOFOL;  Surgeon: Rush Landmark Telford Nab., MD;  Location: [place];  Service: Gastroenterology;  Laterality: N/A;   ESOPHAGOGASTRODUODENOSCOPY (EGD) WITH PROPOFOL N/A 05/17/2022   Procedure: ESOPHAGOGASTRODUODENOSCOPY (EGD) WITH PROPOFOL;  Surgeon: Rush Landmark Telford Nab., MD;  Location: [facility] ENDOSCOPY;  Service: Gastroenterology;  Laterality: N/A;   EUS N/A 05/17/2022   Procedure: UPPER ENDOSCOPIC ULTRASOUND (EUS) RADIAL;  Surgeon: Irving Copas., MD;  Location: [facility] ENDOSCOPY;  Service: Gastroenterology;  Laterality: N/A;   FINE NEEDLE ASPIRATION  04/02/2022   Procedure: FINE NEEDLE ASPIRATION (FNA) LINEAR;  Surgeon: Irving Copas., MD;  Location: [place];  Service: Gastroenterology;;   FINE NEEDLE ASPIRATION N/A 05/17/2022   Procedure: FINE NEEDLE ASPIRATION (FNA) LINEAR;  Surgeon: Irving Copas., MD;  Location: [facility] ENDOSCOPY;  Service: Gastroenterology;  Laterality: N/A;   HEMOSTASIS CLIP PLACEMENT  05/29/2021   Procedure: HEMOSTASIS CLIP PLACEMENT;  Surgeon: Milus Banister, MD;  Location: [facility] ENDOSCOPY;  Service: Gastroenterology;;  HOT HEMOSTASIS N/A 05/29/2021   Procedure: HOT HEMOSTASIS (ARGON PLASMA COAGULATION/BICAP);  Surgeon: Milus Banister, MD;  Location: [facility] ENDOSCOPY;  Service: Gastroenterology;  Laterality: N/A;   HOT HEMOSTASIS  N/A 06/28/2021   Procedure: HOT HEMOSTASIS (ARGON PLASMA COAGULATION/BICAP);  Surgeon: Jerene Bears, MD;  Location: [facility] ENDOSCOPY;  Service: Gastroenterology;  Laterality: N/A;   HOT HEMOSTASIS N/A 08/10/2021   Procedure: HOT HEMOSTASIS (ARGON PLASMA COAGULATION/BICAP);  Surgeon: Jackquline Denmark, MD;  Location: [facility] ENDOSCOPY;  Service: Gastroenterology;  Laterality: N/A;   HOT HEMOSTASIS N/A 08/02/2021   Procedure: HOT HEMOSTASIS (ARGON PLASMA COAGULATION/BICAP);  Surgeon: Lavena Bullion, DO;  Location: [facility] ENDOSCOPY;  Service: Gastroenterology;  Laterality: N/A;   IR PARACENTESIS  07/04/2022   IR PERC TUN PERIT CATH WO PORT S&I /IMAG  05/23/2021   IR REMOVAL TUN CV CATH W/O FL  06/10/2021   IR US GUIDE VASC ACCESS RIGHT  05/23/2021   LAPAROSCOPY N/A 05/24/2021   Procedure: LAPAROSCOPY DIAGNOSTIC;  Surgeon: Rolm Bookbinder, MD;  Location: [place];  Service: General;  Laterality: N/A;   LAPAROTOMY N/A 05/24/2021   Procedure: EXPLORATORY LAPAROTOMY;  Surgeon: Rolm Bookbinder, MD;  Location: [place];  Service: General;  Laterality: N/A;   LEFT AND RIGHT HEART CATHETERIZATION WITH CORONARY ANGIOGRAM N/A 06/11/2011   Procedure: LEFT AND RIGHT HEART CATHETERIZATION WITH CORONARY ANGIOGRAM;  Surgeon: Larey Dresser, MD;  Location: [facility] CATH LAB;  Service: Cardiovascular;  Laterality: N/A;   PERIPHERAL VASCULAR BALLOON ANGIOPLASTY Right 07/29/2020   Procedure: PERIPHERAL VASCULAR BALLOON ANGIOPLASTY;  Surgeon: Angelia Mould, MD;  Location: [place] CV [facility];  Service: Cardiovascular;  Laterality: Right;  arm fistula   REMOVAL OF STONES  05/17/2022   Procedure: REMOVAL OF STONES;  Surgeon: Rush Landmark Telford Nab., MD;  Location: [facility] ENDOS[facility];  Service: Gastroenterology;;   REVISON OF ARTERIOVENOUS FISTULA Right 08/08/2020   Procedure: ANEURYSM EXCISION OF RIGHT UPPER EXTREMITY ARTERIOVENOUS FISTULA;  Surgeon: Angelia Mould, MD;  Location: [place];  Service: Vascular;  Laterality: Right;    REVISON OF ARTERIOVENOUS FISTULA Right 03/21/2022   Procedure: EXCISION OF ULCERATED SKIN OF RIGHT ARTERIOVENOUS FISTULA REVISION;  Surgeon: Marty Heck, MD;  Location: [place];  Service: Vascular;  Laterality: Right;   REVISON OF ARTERIOVENOUS FISTULA Right 07/02/2022   Procedure: EXCISION OF ULCERS RIGHT ARTERIOVENOUS FISTULA;  Surgeon: Angelia Mould, MD;  Location: [place];  Service: Vascular;  Laterality: Right;   SCLEROTHERAPY  05/29/2021   Procedure: Clide Deutscher;  Surgeon: Milus Banister, MD;  Location: [facility] ENDOSCOPY;  Service: Gastroenterology;;   Joan Mayans  04/04/2022   Procedure: SPHINCTEROTOMY;  Surgeon: Ladene Artist, MD;  Location: [facility] ENDOSCOPY;  Service: Gastroenterology;;   Lavell Islam REMOVAL  05/29/2021   Procedure: STENT REMOVAL;  Surgeon: Milus Banister, MD;  Location: [place];  Service: Gastroenterology;;   Lavell Islam REMOVAL  05/17/2022   Procedure: STENT REMOVAL;  Surgeon: Irving Copas., MD;  Location: [facility] ENDOSCOPY;  Service: Gastroenterology;;   TEE WITHOUT CARDIOVERSION N/A 12/27/2015   Procedure: TRANSESOPHAGEAL ECHOCARDIOGRAM (TEE);  Surgeon: Sueanne Margarita, MD;  Location: [place];  Service: Cardiovascular;  Laterality: N/A;   UPPER ESOPHAGEAL ENDOSCOPIC ULTRASOUND (EUS) N/A 04/02/2022   Procedure: UPPER ESOPHAGEAL ENDOSCOPIC ULTRASOUND (EUS);  Surgeon: Irving Copas., MD;  Location: [place];  Service: Gastroenterology;  Laterality: N/A;     A IV Location/Drains/Wounds Patient Lines/Drains/Airways Status     Active Line/Drains/Airways     Name Placement date Placement time Site Days   Peripheral IV  07/23/22 20 G Left;Posterior Forearm 07/23/22  --  Forearm  1   Fistula / Graft Right Upper arm Arteriovenous fistula 08/01/21  0354  Upper arm  357            Intake/Output Last 24 hours  Intake/Output Summary (Last 24 hours) at 07/24/2022 0031 Last data filed at 07/23/2022 2140 Gross per 24 hour  Intake 100 ml   Output --  Net 100 ml    Labs/Imaging Results for orders placed or performed during the hospital encounter of 07/23/22 (from the past 48 hour(s))  Comprehensive metabolic panel     Status: Abnormal   Collection Time: 07/23/22  6:13 PM  Result Value Ref Range   Sodium 139 135 - 145 mmol/L   Potassium 3.9 3.5 - 5.1 mmol/L   Chloride 98 98 - 111 mmol/L   CO2 13 (L) 22 - 32 mmol/L   Glucose, Bld 34 (LL) 70 - 99 mg/dL    Comment: CRITICAL RESULT CALLED TO, READ BACK BY AND VERIFIED WITH M. JACKSON RN 07/23/22 @1928  BY J. WHITE Glucose reference range applies only to samples taken after fasting for at least 8 hours.    BUN 38 (H) 8 - 23 mg/dL   Creatinine, Ser 5.82 (H) 0.61 - 1.24 mg/dL   Calcium 7.4 (L) 8.9 - 10.3 mg/dL   Total Protein 6.6 6.5 - 8.1 g/dL   Albumin 2.9 (L) 3.5 - 5.0 g/dL   AST 40 15 - 41 U/L   ALT 18 0 - 44 U/L   Alkaline Phosphatase 155 (H) 38 - 126 U/L   Total Bilirubin 2.9 (H) 0.3 - 1.2 mg/dL   GFR, Estimated 10 (L) >60 mL/min    Comment: (NOTE) Calculated using the CKD-EPI Creatinine Equation (2021)    Anion gap 28 (H) 5 - 15    Comment: ELECTROLYTES REPEATED TO VERIFY Performed at [facility], [address], [place], [place] [address]   CBC with Differential     Status: Abnormal   Collection Time: 07/23/22  6:13 PM  Result Value Ref Range   WBC 9.7 4.0 - 10.5 K/uL   RBC 1.70 (L) 4.22 - 5.81 MIL/uL   Hemoglobin 6.9 (LL) 13.0 - 17.0 g/dL    Comment: REPEATED TO VERIFY THIS CRITICAL RESULT HAS VERIFIED AND BEEN CALLED TO E ANELLO,RN BY WALTER BOND ON 02 05 2024 AT 1845, AND HAS BEEN READ BACK.     HCT 18.7 (L) 39.0 - 52.0 %   MCV 110.0 (H) 80.0 - 100.0 fL   MCH 40.6 (H) 26.0 - 34.0 pg   MCHC 36.9 (H) 30.0 - 36.0 g/dL   RDW 18.2 (H) 11.5 - 15.5 %   Platelets 118 (L) 150 - 400 K/uL   nRBC 1.3 (H) 0.0 - 0.2 %   Neutrophils Relative % 77 %   Neutro Abs 7.5 1.7 - 7.7 K/uL   Lymphocytes Relative 2 %   Lymphs Abs 0.2 (L) 0.7 - 4.0 K/uL   Monocytes  Relative 19 %   Monocytes Absolute 1.8 (H) 0.1 - 1.0 K/uL   Eosinophils Relative 0 %   Eosinophils Absolute 0.0 0.0 - 0.5 K/uL   Basophils Relative 0 %   Basophils Absolute 0.0 0.0 - 0.1 K/uL   Immature Granulocytes 2 %   Abs Immature Granulocytes 0.20 (H) 0.00 - 0.07 K/uL    Comment: Performed at [facility], [address], [place], [place] [address]  CBG monitoring, ED  Status: Abnormal   Collection Time: 07/23/22  8:00 PM  Result Value Ref Range   Glucose-Capillary 43 (LL) 70 - 99 mg/dL    Comment: Glucose reference range applies only to samples taken after fasting for at least 8 hours.   Comment 1 Notify RN   CBG monitoring, ED     Status: Abnormal   Collection Time: 07/23/22  8:29 PM  Result Value Ref Range   Glucose-Capillary 69 (L) 70 - 99 mg/dL    Comment: Glucose reference range applies only to samples taken after fasting for at least 8 hours.  CBG monitoring, ED     Status: None   Collection Time: 07/23/22 11:54 PM  Result Value Ref Range   Glucose-Capillary 96 70 - 99 mg/dL    Comment: Glucose reference range applies only to samples taken after fasting for at least 8 hours.   *Note: Due to a large number of results and/or encounters for the requested time period, some results have not been displayed. A complete set of results can be found in Results Review.   DG Chest 2 View  Result Date: 07/23/2022 CLINICAL DATA:  Shortness of breath. EXAM: CHEST - 2 VIEW COMPARISON:  Chest radiograph dated 06/07/2022. FINDINGS: Cardiomegaly with central vascular congestion. Small right pleural effusion and right lung base atelectasis or infiltrate, new since the prior radiograph. No pneumothorax. No acute osseous pathology. IMPRESSION: 1. Cardiomegaly with central vascular congestion. 2. Small right pleural effusion and right lung base atelectasis or infiltrate. Follow-up recommended. Electronically Signed   By: Anner Crete M.D.   On: 07/23/2022 17:13    Pending  Labs Unresulted Labs (From admission, onward)     Start     Ordered   07/24/22 0500  Prealbumin  Tomorrow morning,   R        07/23/22 2311   07/23/22 2312  CK  Add-on,   AD        07/23/22 2311   07/23/22 2312  Magnesium  Add-on,   AD        07/23/22 2311   07/23/22 2312  Phosphorus  Add-on,   AD        07/23/22 2311   07/23/22 2312  Vitamin B12  (Anemia Panel (PNL))  Once,   R        07/23/22 2311   07/23/22 2312  Folate  (Anemia Panel (PNL))  Once,   R        07/23/22 2311   07/23/22 2312  Iron and TIBC  (Anemia Panel (PNL))  Once,   R        07/23/22 2311   07/23/22 2312  Ferritin  (Anemia Panel (PNL))  Once,   R        07/23/22 2311   07/23/22 2312  Reticulocytes  (Anemia Panel (PNL))  Once,   R        07/23/22 2311   07/23/22 2312  TSH  Add-on,   AD        07/23/22 2311   07/23/22 2312  Blood gas, venous  ONCE - STAT,   STAT       Question:  Release to patient  Answer:  Immediate   07/23/22 2311   07/23/22 2311  Respiratory (~20 pathogens) panel by PCR  (Respiratory panel by PCR (~20 pathogens, ~24 hr TAT)  w precautions)  Once,   R        07/23/22 2310   07/23/22 2309  Procalcitonin  Add-on,  AD        07/23/22 2308   07/23/22 2308  HIV Antibody (routine testing w rflx)  (HIV Antibody (Routine testing w reflex) panel)  Once,   URGENT        07/23/22 2308   07/23/22 2308  Lactic acid, plasma  STAT Now then every 2 hours,   R      07/23/22 2308   07/23/22 2308  Culture, blood (routine x 2) Call MD if unable to obtain prior to antibiotics being given  BLOOD CULTURE X 2,   R     Comments: If blood cultures drawn in Emergency Department - Do not draw and cancel order    07/23/22 2308   07/23/22 2308  Expectorated Sputum Assessment w Gram Stain, Rflx to Resp Cult  Once,   R        07/23/22 2308   07/23/22 2308  Legionella Pneumophila Serogp 1 Ur Ag  Once,   URGENT        07/23/22 2308   07/23/22 2308  Strep pneumoniae urinary antigen  Once,   URGENT        07/23/22 2308    07/23/22 2053  MRSA Next Gen by PCR, Nasal  (MRSA Screening)  Once,   URGENT        07/23/22 2053   Signed and Held  Comprehensive metabolic panel  Tomorrow morning,   R       Question:  Release to patient  Answer:  Immediate   Signed and Held   Signed and Held  CBC  Tomorrow morning,   R       Question:  Release to patient  Answer:  Immediate   Signed and Held   Signed and Held  Magnesium  Tomorrow morning,   R        Signed and Held   Signed and Held  Phosphorus  Tomorrow morning,   R        Signed and Held            Vitals/Pain Today's Vitals   07/23/22 2130 07/23/22 2200 07/23/22 2300 07/23/22 2330  BP: (!) 118/95 121/85 121/73 111/77  Pulse: 98     Resp:      Temp:      TempSrc:      SpO2: 99%     Weight:      Height:      PainSc:        Isolation Precautions Droplet precaution  Medications Medications  vancomycin (VANCOREADY) IVPB 1250 mg/250 mL (1,250 mg Intravenous New Bag/Given 07/23/22 2358)  dextrose 50 % solution 12.5 g (has no administration in time range)  tiZANidine (ZANAFLEX) tablet 2 mg (2 mg Oral Given 07/23/22 2103)  ceFEPIme (MAXIPIME) 1 g in sodium chloride 0.9 % 100 mL IVPB (0 g Intravenous Stopped 07/23/22 2140)    Mobility walks with device and person assist     Focused Assessments Renal Assessment Handoff:  Hemodialysis Schedule: Hemodialysis Schedule: Tuesday/Thursday/Saturday Last Hemodialysis date and time: 07/21/2022   Restricted appendage: right arm   R Recommendations: See Admitting Provider Note  Report given to:   Additional Notes:

## 2022-07-24 NOTE — Evaluation (Signed)
Physical Therapy Evaluation Patient Details Name: Patrick [name] MRN: [id number] DOB: [birth year] Today's Date: 07/24/2022  History of Present Illness  67 yo male presents to [facility] on 2/5 with  SOB, back pain, abd distention. CXR shows Cardiomegaly with central vascular congestion, Small right pleural effusion and right lung base atelectasis or infiltrate. 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  Clinical Impression   Pt presents with generalized weakness, impaired balance with recent history of falls, decreased activity tolerance Pt to benefit from acute PT to address deficits. Pt ambulated from bed to chair only this date, states he is too fatigued with DOE 2/4 (SpO2 92% and greater on RA). PT anticipates pt will progress well while acute. PT to progress mobility as tolerated, and will continue to follow acutely.         Recommendations for follow up therapy are one component of a multi-disciplinary discharge planning process, led by the attending physician.  Recommendations may be updated based on patient status, additional functional criteria and insurance authorization.  Follow Up Recommendations Home health PT      Assistance Recommended at Discharge Set up Supervision/Assistance  Patient can return home with the following  A little help with walking and/or transfers;A little help with bathing/dressing/bathroom    Equipment Recommendations None recommended by PT  Recommendations for Other Services       Functional Status Assessment Patient has had a recent decline in their functional status and demonstrates the ability to make significant improvements in function in a reasonable and predictable amount of time.     Precautions / Restrictions Precautions Precautions: Fall Precaution Comments: fall last week at work because he tripped over  object Restrictions Weight Bearing Restrictions: No      Mobility  Bed Mobility Overal bed mobility: Needs Assistance Bed Mobility: Supine to Sit     Supine to sit: Supervision, HOB elevated     General bed mobility comments: increased time and use of HOB elevation    Transfers Overall transfer level: Needs assistance Equipment used: None Transfers: Sit to/from Stand, Bed to chair/wheelchair/BSC Sit to Stand: Supervision   Step pivot transfers: Supervision       General transfer comment: supervision for rise and transfer to recliner via short steps, for safety. Pt declines further mobility    Ambulation/Gait               General Gait Details: nt - pt declines  Financial trader Rank[place] (Stroke Patients Only)       Balance Overall balance assessment: Mild deficits observed, not formally tested, History of Falls                                           Pertinent Vitals/Pain Pain Assessment Pain Assessment: No/denies pain    Home Living Family/patient expects to be discharged to:: Private residence Living Arrangements: Alone Available Help at Discharge: Family;Available PRN/intermittently Type of Home: Apartment Home Access: Stairs to enter Entrance Stairs-Rails: Right;Left Entrance Stairs-Number of Steps: 16   Home Layout: One level Home Equipment: None      Prior Function Prior Level of Function : Independent/Modified Independent;History of Falls (last six months);Working/employed  Mobility 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     Hand Dominance   Dominant Hand: Right    Extremity/Trunk Assessment   Upper Extremity Assessment Upper Extremity Assessment: Defer to OT evaluation    Lower Extremity Assessment Lower Extremity  Assessment: Generalized weakness    Cervical / Trunk Assessment Cervical / Trunk Assessment: Normal  Communication   Communication: No difficulties  Cognition Arousal/Alertness: Awake/alert Behavior During Therapy: Flat affect Overall Cognitive Status: Within Functional Limits for tasks assessed                                          General Comments General comments (skin integrity, edema, etc.): Pt satting 92% and greater on RA for transfer-level mobility, left on RA    Exercises     Assessment/Plan    PT Assessment Patient needs continued PT services  PT Problem List Decreased mobility;Decreased strength;Decreased activity tolerance;Decreased balance;Cardiopulmonary status limiting activity       PT Treatment Interventions Therapeutic activities;DME instruction;Gait training;Therapeutic exercise;Patient/family education;Stair training;Balance training;Functional mobility training;Neuromuscular re-education    PT Goals (Current goals can be found in the Care Plan section)  Acute Rehab PT Goals Patient Stated Goal: home PT Goal Formulation: With patient Time For Goal Achievement: 08/07/22 Potential to Achieve Goals: Good    Frequency Min 3X/week     Co-evaluation               AM-PAC PT "6 Clicks" Mobility  Outcome Measure Help needed turning from your back to your side while in a flat bed without using bedrails?: None Help needed moving from lying on your back to sitting on the side of a flat bed without using bedrails?: None Help needed moving to and from a bed to a chair (including a wheelchair)?: None Help needed standing up from a chair using your arms (e.g., wheelchair or bedside chair)?: None Help needed to walk in hospital room?: A Little Help needed climbing 3-5 steps with a railing? : A Little 6 Click Score: 22    End of Session   Activity Tolerance: Patient tolerated treatment well Patient left: in chair;with call bell/phone  within reach;with chair alarm set Nurse Communication: Mobility status PT Visit Diagnosis: Unsteadiness on feet (R26.81);Muscle weakness (generalized) (M62.81)    Time: 9371-6967 PT Time Calculation (min) (ACUTE ONLY): 15 min   Charges:   PT Evaluation $PT Eval Low Complexity: 1 Low         Alby Schwabe S, PT DPT Acute Rehabilitation Services Pager [phone]  Office [phone]   Louis Matte 07/24/2022, 9:34 AM

## 2022-07-24 NOTE — Procedures (Signed)
   I was present at this dialysis session, have reviewed the session itself and made  appropriate changes Kelly Splinter MD [place] pager [phone]   07/24/2022, 4:23 PM

## 2022-07-24 NOTE — Progress Notes (Signed)
Pharmacy Antibiotic Note  Patrick [name] is a 67 y.o. male for which pharmacy has been consulted for vancomycin and cefepime dosing for pneumonia.  Patient with a history of AF, HF, ESRD on HD T/T/Sa outpatient. Patient presenting with SOB.  WBC 8.9, trending down Pt remains afebrile Plan for HD today   Plan: Start vancomycin 750mg  IV qHD T/T/Sa   > Goal vancomycin trough level 15-20   > Check pre-HD levels at steady state Continue cefepime 1g IV q24h Monitor daily CBC, temp, and for clinical signs of improvement  F/u cultures and MRSA PCR and de-escalate antibiotics as able  F/u dialysis plans per nephrology  Height: 5\' 11"  (180.3 cm) Weight: 71 kg (156 lb 9.6 oz) IBW/kg (Calculated) : 75.3  Temp (24hrs), Avg:97.2 F (36.2 C), Min:95.9 F (35.5 C), Max:98.2 F (36.8 C)  Recent Labs  Lab 07/23/22 1813 07/24/22 0156 07/24/22 0642  WBC 9.7  --  8.9  CREATININE 5.82*  --  6.96*  LATICACIDVEN  --  5.7* 3.1*     Estimated Creatinine Clearance: 10.8 mL/min (A) (by C-G formula based on SCr of 6.96 mg/dL (H)).    No Known Allergies  Antimicrobials this admission: Cefepime 2/6 >>  Vancomycin 2/6 >>   Dose adjustments this admission: N/A  Microbiology results: 2/6 BCx: pending 2/6 Sputum: pending  2/6 MRSA PCR: pending  Thank you for allowing pharmacy to be a part of this patient's care.  Luisa Hart, PharmD, BCPS Clinical Pharmacist 07/24/2022 10:51 AM   Please refer to [facility] for pharmacy phone number

## 2022-07-24 NOTE — Hospital Course (Addendum)
67 y.o. 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  He had left dialysis early on Saturday and stopped taking his meds and has gained some weight since and was noticing increased fatigue, shortness of breath. In the ED tachycardic 118 tachypneic 29 BP 133/96, saturating well on room air.  Labs showed creatinine 5.8 low bicarb 13, hemoglobin 6.9 g MCV 110.Chest x-ray with cardiomegaly central vascular congestion and small right pleural effusion right lung base atelectasis or infiltrate Patient was admitted for fluid overload, a.fib w rvr, anemia and Hypoglycemia , possible pneumonia.Seen by nephrology underwent dialysis 2/6> with significant improvement in his symptoms.  He had A-fib with RVR but it does not fairly controlled, unable to use beta-blocker due to soft BP seen by cardiology.  Symptomology improved after blood transfusion as well, pain management addressed with Dilaudid suspecting pain crisis.  Underwent extra fluid removal with dialysis also got antibiotics at this time he has clinically improved. He is being discharged home in medically stable condition with home health services.

## 2022-07-24 NOTE — TOC Initial Note (Addendum)
Transition of Care [facility]) - Initial/Assessment Note    Patient Details  Name: Patrick [name] MRN: [id number] Date of Birth: Mar 14, 1956  Transition of Care [facility]) CM/SW Contact:    Zenon Mayo, RN Phone Number: 07/24/2022, 1:37 PM  Clinical Narrative:                 Patient is from home alone, he has a PCP, he is a HD patient on TTHSAT, presents with afib with RVR, PNA, anarsarca, low blood sugar, patient still works, hgb was 6.9 also.  NCM offered choice for HHPT/HHOT with medicare .gov he states he has no preference.  NCM made referral to [facility] with [facility] for HHPT/HHOT. He is able to take referral.  Soc will begin 24 to 48 hrs post dc. Patient has a Marine scientist thru [place] already.  Patient states he does not want a BSC.  Patient states hopefully he can get in contact with his cousin to transport him home at discharge, he will need to give him a date of when to pick him up. Will need HHPT, HHOT orders.  Expected Discharge Plan: [place] Barriers to Discharge: Continued Medical Work up   Patient Goals and CMS Choice Patient states their goals for this hospitalization and ongoing recovery are:: return home CMS Medicare.gov Compare Post Acute Care list provided to:: Patient Choice offered to / list presented to : Patient      Expected Discharge Plan and Services In-house Referral: NA Discharge Planning Services: CM Consult Post Acute Care Choice: [place] arrangements for the past 2 months: Single Family Home                 DME Arranged: N/A DME Agency: NA       HH Arranged: PT, OT HH Agency: [place] Date [facility] Agency Contacted: 07/24/22 Time HH Agency Contacted: 80 Representative spoke with at [place]: Tommi Rumps  Prior Living Arrangements/Services Living arrangements for the past 2 months: [place] with:: Self Patient language and need for interpreter reviewed:: Yes Do you feel safe going back to the  place where you live?: Yes      Need for Family Participation in Patient Care: No (Comment) Care giver support system in place?: No (comment) Current home services: Home RN (RN is thru Teacher, adult education) Criminal Activity/Legal Involvement Pertinent to Current Situation/Hospitalization: No - Comment as needed  Activities of Daily Living Home Assistive Devices/Equipment: None ADL Screening (condition at time of admission) Patient's cognitive ability adequate to safely complete daily activities?: Yes Is the patient deaf or have difficulty hearing?: No Does the patient have difficulty seeing, even when wearing glasses/contacts?: Yes Does the patient have difficulty concentrating, remembering, or making decisions?: No Patient able to express need for assistance with ADLs?: Yes Does the patient have difficulty dressing or bathing?: Yes Independently performs ADLs?: Yes (appropriate for developmental age) Does the patient have difficulty walking or climbing stairs?: No Weakness of Legs: Left Weakness of Arms/Hands: Right  Permission Sought/Granted                  Emotional Assessment Appearance:: Appears stated age Attitude/Demeanor/Rapport: Engaged Affect (typically observed): Appropriate Orientation: : Oriented to Self, Oriented to Place, Oriented to  Time, Oriented to Situation Alcohol / Substance Use: Not Applicable Psych Involvement: No (comment)  Admission diagnosis:  Shortness of breath [R06.02] Hypoglycemia [E16.2] Atrial fibrillation with RVR ([place]) [I48.91] HCAP (healthcare-associated pneumonia) [J18.9] Patient Active Problem List   Diagnosis  Date Noted   Atrial fibrillation with RVR ([place]) 07/23/2022   Hypoglycemia 07/23/2022   CAP (community acquired pneumonia) 06/07/2022   Cystic lesion of abdominal viscera 04/06/2022   Bile leak    Cholelithiasis 11/30/2021   Chronic pain syndrome    Persistent atrial fibrillation ([place]) 09/13/2021   Abdominal fluid collection  08/22/2021   Gastric AVM    Hiatal hernia    History of GI bleed 06/27/2021   Visual impairment 06/27/2021   COPD (chronic obstructive pulmonary disease) ([place]) 06/27/2021   Acute gangrenous cholecystitis s/p open cholcystectomy 05/24/2021 05/27/2021   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 05/24/2021 05/27/2021   Prolonged QT interval 08/02/2020   Chronic anticoagulation    Tobacco dependence 66/11/3014   Chronic systolic CHF (congestive heart failure) ([place]) 09/11/2019   Encounter for therapeutic drug monitoring 12/29/2015   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   ESRD on HD TTS 05/06/2014   Vitamin D deficiency 09/25/2013   CKD (chronic kidney disease), stage IV ([place]) 09/25/2013   Onychomycosis of toenail 08/27/2013   Sickle cell anemia ([place]) 11/21/2012   NSVT (nonsustained ventricular tachycardia) ([place]) 10/21/2011   Gout 10/19/2011   Essential hypertension 06/07/2011   PCP:  [name], MD Pharmacy:   [id number] [address], [place] [address] Phone: [phone] Fax: [phone]     Social Determinants of Health (SDOH) Social History: SDOH Screenings   Food Insecurity: No Food Insecurity (07/24/2022)  Housing: Low Risk  (07/24/2022)  Transportation Needs: No Transportation Needs (07/24/2022)  Utilities: Not At Risk (07/24/2022)  Depression (PHQ2-9): Low Risk  (04/17/2022)  Financial Resource Strain: Low Risk  (06/02/2021)  Physical Activity: Sufficiently Active (10/01/2019)  Tobacco Use: Medium Risk (07/23/2022)   SDOH Interventions:     Readmission Risk Interventions    07/24/2022    1:28 PM 08/05/2020   11:54 AM  Readmission Risk Prevention Plan  Transportation Screening Complete Complete  PCP or Specialist Appt within 3-5 Days  Complete  HRI or [place]  Complete  Social Work Consult for [place] Planning/Counseling  Complete  Palliative Care Screening  Not Applicable  Medication Review Designer, fashion/clothing) Complete Complete  PCP or Specialist appointment within 3-5 days of discharge Complete   HRI or [place] Complete   [place] Not Applicable

## 2022-07-24 NOTE — Progress Notes (Signed)
07/24/2022 0320 Date and time results received: 07/24/22 0318 (use smartphrase ".now" to insert current time)  Test: Lactic Acid Critical Value: 5.7  Name of Provider Notified: Dr. Bridgett Larsson  Orders Received? Or Actions Taken?: Orders Received - See Orders for details  [place]

## 2022-07-24 NOTE — Progress Notes (Signed)
Mobility Specialist - Progress Note   07/24/22 1400  Mobility  Activity Refused mobility    Pt stated he was not feeling well and declined mobility. Will follow up as able.   [place] Specialist Please contact via SecureChat or Rehab office at [phone]

## 2022-07-24 NOTE — Evaluation (Signed)
Occupational Therapy Evaluation Patient Details Name: Patrick [name] MRN: [id number] DOB: 1956-04-27 Today's Date: 07/24/2022   History of Present Illness 67 yo male presents to [facility] on 2/5 with  SOB, back pain, abd distention. CXR shows Cardiomegaly with central vascular congestion, Small right pleural effusion and right lung base atelectasis or infiltrate. 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   Clinical Impression   Pt reports independence at baseline with ADLs and functional mobility, lives alone in second floor apartment. Pt currently declining OOB mobility after just returning to bed with NSG staff and reports 10/10 pain. Pt UE/LE ROM and strength WFL, able to reach down toward feet for simulated LB ADLs. Pt keeping eyes closed most of session, answers all PLOF questions and follows commands appropriately. Pt presenting with impairments listed below, will follow acutely. Recommend HHOT at d/c pending progression.      Recommendations for follow up therapy are one component of a multi-disciplinary discharge planning process, led by the attending physician.  Recommendations may be updated based on patient status, additional functional criteria and insurance authorization.   Follow Up Recommendations  Home health OT (pending progression)     Assistance Recommended at Discharge Set up Supervision/Assistance  Patient can return home with the following A little help with walking and/or transfers;A little help with bathing/dressing/bathroom;Assistance with cooking/housework;Direct supervision/assist for medications management;Direct supervision/assist for financial management;Assist for transportation;Help with stairs or ramp for entrance    Functional Status Assessment  Patient has had a recent decline in their functional status and demonstrates the ability to make significant  improvements in function in a reasonable and predictable amount of time.  Equipment Recommendations  BSC/3in1    Recommendations for Other Services PT consult     Precautions / Restrictions Precautions Precautions: Fall Precaution Comments: fall last week at work because he tripped over object Restrictions Weight Bearing Restrictions: No      Mobility Bed Mobility               General bed mobility comments: pt declined    Transfers                   General transfer comment: pt declined      Balance Overall balance assessment: Mild deficits observed, not formally tested, History of Falls                                         ADL either performed or assessed with clinical judgement   ADL Overall ADL's : Needs assistance/impaired Eating/Feeding: Supervision/ safety   Grooming: Min guard   Upper Body Bathing: Minimal assistance   Lower Body Bathing: Minimal assistance   Upper Body Dressing : Minimal assistance   Lower Body Dressing: Minimal assistance Lower Body Dressing Details (indicate cue type and reason): simulated via ROM Toilet Transfer: Minimal assistance   Toileting- Clothing Manipulation and Hygiene: Minimal assistance       Functional mobility during ADLs: Minimal assistance       Vision   Vision Assessment?: No apparent visual deficits Additional Comments: keeps eyes closed frequently during session     Perception Perception Perception Tested?: No   Praxis Praxis Praxis tested?: Not tested    Pertinent Vitals/Pain Pain Assessment Pain Assessment: Faces Pain Score: 10-Worst pain ever Faces Pain  Scale: Hurts worst Pain Location: generalized Pain Descriptors / Indicators: Discomfort Pain Intervention(s): Limited activity within patient's tolerance, Monitored during session     Hand Dominance Right   Extremity/Trunk Assessment Upper Extremity Assessment Upper Extremity Assessment: Overall WFL for  tasks assessed   Lower Extremity Assessment Lower Extremity Assessment: Overall WFL for tasks assessed   Cervical / Trunk Assessment Cervical / Trunk Assessment: Normal   Communication Communication Communication: No difficulties   Cognition Arousal/Alertness: Awake/alert Behavior During Therapy: Flat affect Overall Cognitive Status: Within Functional Limits for tasks assessed                                       General Comments  SpO2 in mid 90's on RA    Exercises     Shoulder Instructions      Home Living Family/patient expects to be discharged to:: Private residence Living Arrangements: Alone Available Help at Discharge: Family;Available PRN/intermittently Type of Home: Apartment Home Access: Stairs to enter Entrance Stairs-Number of Steps: 16 Entrance Stairs-Rails: Right;Left Home Layout: One level     Bathroom Shower/Tub: Teacher, early years/pre: Handicapped height     Home Equipment: Conservation officer, nature (2 wheels);Cane - single point          Prior Functioning/Environment Prior Level of Function : Independent/Modified Independent;History of Falls (last six months);Working/employed             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 ind with ADLs/IADLs, reports some difficulty with med mgmt due to reading labels        OT Problem List: Decreased strength;Decreased range of motion;Impaired balance (sitting and/or standing);Decreased activity tolerance;Decreased safety awareness;Cardiopulmonary status limiting activity;Impaired UE functional use      OT Treatment/Interventions: Self-care/ADL training;Therapeutic exercise;Energy conservation;DME and/or AE instruction;Therapeutic activities;Patient/family education;Balance training    OT Goals(Current goals can be found in the care plan  section) Acute Rehab OT Goals Patient Stated Goal: none stated OT Goal Formulation: With patient Time For Goal Achievement: 08/07/22 Potential to Achieve Goals: Good ADL Goals Pt Will Perform Upper Body Dressing: with modified independence;sitting Pt Will Perform Lower Body Dressing: with modified independence;sit to/from stand;sitting/lateral leans Pt Will Transfer to Toilet: with modified independence;ambulating;regular height toilet Pt Will Perform Tub/Shower Transfer: Shower transfer;Tub transfer;with modified independence;ambulating Additional ADL Goal #1: pt will be able to stand x66min for functional task in order to improve acitivity tolerance for ADLs  OT Frequency: Min 2X/week    Co-evaluation              AM-PAC OT "6 Clicks" Daily Activity     Outcome Measure Help from another person eating meals?: A Little Help from another person taking care of personal grooming?: A Little Help from another person toileting, which includes using toliet, bedpan, or urinal?: A Little Help from another person bathing (including washing, rinsing, drying)?: A Little Help from another person to put on and taking off regular upper body clothing?: A Little Help from another person to put on and taking off regular lower body clothing?: A Little 6 Click Score: 18   End of Session Nurse Communication: Mobility status  Activity Tolerance: Patient limited by pain;Patient limited by fatigue Patient left: in bed;with call bell/phone within reach;with bed alarm set  OT Visit  Diagnosis: Unsteadiness on feet (R26.81);Other abnormalities of gait and mobility (R26.89);Muscle weakness (generalized) (M62.81)                Time: 0034-9179 OT Time Calculation (min): 12 min Charges:  OT General Charges $OT Visit: 1 Visit OT Evaluation $OT Eval Low Complexity: 1 Low  Javion Holmer K, OTD, OTR/L SecureChat Preferred Acute Rehab [phone]   Keaghan Staton K Koonce 07/24/2022, 11:08 AM

## 2022-07-24 NOTE — Consult Note (Signed)
Renal Service Consult Note [facility]  Patrick [name] 07/24/2022 [name], MD Requesting Physician: Dr. Lupita Leash  Reason for Consult: ESRD pt w/ SOB, anemia HPI: The patient is a 67 y.o. year-old w/ PMH as below who presented to ED for DOE/ SOB. States his last HD session his UF was cutoff early and they only got 1.5 L off (w/ goal of 6kg). Pt has sickle cell and has to drink extra fluids but usually tolerates a large UF goal.  K+ 5.7 today as well. Pt admitted and we are asked to see for ESRD.    Pt seen in room, hx as above.  No cough, no fever, no abd pain or n/v/d.    ROS - denies CP, no joint pain, no HA, no blurry vision, no rash, no diarrhea, no nausea/ vomiting, no dysuria, no difficulty voiding   Past Medical History  Past Medical History:  Diagnosis Date   A-fib ([place])    Blood dyscrasia    Blood transfusion    "I've had a bunch of them"   CHF (congestive heart failure) ([place])    followed by hf clinic   Chronic kidney disease    Stage 5, dialysis on Tu/Th/Sa   Dialysis patient ([place]) 04/2019   AV fistula (right arm), TTS   Dysrhythmia    A-Fib per patient   Early satiety    Elevated LFTs 04/01/2022   Elevated troponin 04/01/2022   Empyema of gallbladder 05/27/2021   GERD (gastroesophageal reflux disease)    Gout    Legally blind    Metabolic acidosis with increased anion gap and accumulation of organic acids 04/01/2022   Mitral regurgitation    Muscle tightness-Right arm  05/27/2014   Pneumonia    Pulmonary hypertension (HCC)    Shortness of breath dyspnea    Sickle cell disease, type SS (HCC)    Swelling abdomen    Swelling of extremity, left    Swelling of extremity, right    Tricuspid regurgitation    Past Surgical History  Past Surgical History:  Procedure Laterality Date   A/V FISTULAGRAM Right 07/29/2020   Procedure: A/V FISTULAGRAM - Right Arm;  Surgeon: Angelia Mould, MD;  Location: [place] [facility];  Service:  Cardiovascular;  Laterality: Right;   BASCILIC VEIN TRANSPOSITION Right 04/12/2014   Procedure: BASILIC VEIN TRANSPOSITION;  Surgeon: Elam Dutch, MD;  Location: [place];  Service: Vascular;  Laterality: Right;   BILIARY STENT PLACEMENT  04/04/2022   Procedure: BILIARY STENT PLACEMENT;  Surgeon: Ladene Artist, MD;  Location: [place];  Service: Gastroenterology;;   BIOPSY  08/04/2020   Procedure: BIOPSY;  Surgeon: Ladene Artist, MD;  Location: [place];  Service: Gastroenterology;;   BIOPSY  06/28/2021   Procedure: BIOPSY;  Surgeon: Jerene Bears, MD;  Location: [place];  Service: Gastroenterology;;   BIOPSY  04/02/2022   Procedure: BIOPSY;  Surgeon: Irving Copas., MD;  Location: [place];  Service: Gastroenterology;;   BOWEL RESECTION N/A 05/24/2021   Procedure: SMALL BOWEL RESECTION;  Surgeon: Rolm Bookbinder, MD;  Location: [place];  Service: General;  Laterality: N/A;   CARDIOVERSION N/A 06/05/2018   Procedure: CARDIOVERSION;  Surgeon: Jolaine Artist, MD;  Location: [facility] ENDOSCOPY;  Service: Cardiovascular;  Laterality: N/A;   CARDIOVERSION N/A 10/06/2019   Procedure: CARDIOVERSION;  Surgeon: Jolaine Artist, MD;  Location: [place];  Service: Cardiovascular;  Laterality: N/A;   CHOLECYSTECTOMY N/A 05/24/2021   Procedure: OPEN CHOLECYSTECTOMY;  Surgeon: Rolm Bookbinder, MD;  Location: [place];  Service: General;  Laterality: N/A;   COLONOSCOPY WITH PROPOFOL N/A 09/24/2017   Procedure: COLONOSCOPY WITH PROPOFOL;  Surgeon: Jackquline Denmark, MD;  Location: [facility] ENDOSCOPY;  Service: Endoscopy;  Laterality: N/A;   ENDOSCOPIC RETROGRADE CHOLANGIOPANCREATOGRAPHY (ERCP) WITH PROPOFOL N/A 05/17/2022   Procedure: ENDOSCOPIC RETROGRADE CHOLANGIOPANCREATOGRAPHY (ERCP) WITH PROPOFOL;  Surgeon: Rush Landmark Telford Nab., MD;  Location: [facility] ENDOSCOPY;  Service: Gastroenterology;  Laterality: N/A;   ERCP N/A 04/04/2022   Procedure: ENDOSCOPIC RETROGRADE  CHOLANGIOPANCREATOGRAPHY (ERCP);  Surgeon: Ladene Artist, MD;  Location: [place];  Service: Gastroenterology;  Laterality: N/A;   ESOPHAGOGASTRODUODENOSCOPY N/A 09/19/2017   Procedure: ESOPHAGOGASTRODUODENOSCOPY (EGD);  Surgeon: Jackquline Denmark, MD;  Location: [facility] ENDOSCOPY;  Service: Endoscopy;  Laterality: N/A;   ESOPHAGOGASTRODUODENOSCOPY N/A 05/29/2021   Procedure: ESOPHAGOGASTRODUODENOSCOPY (EGD);  Surgeon: Milus Banister, MD;  Location: [facility] ENDOSCOPY;  Service: Gastroenterology;  Laterality: N/A;   ESOPHAGOGASTRODUODENOSCOPY (EGD) WITH PROPOFOL N/A 08/04/2020   Procedure: ESOPHAGOGASTRODUODENOSCOPY (EGD) WITH PROPOFOL;  Surgeon: Ladene Artist, MD;  Location: [place];  Service: Gastroenterology;  Laterality: N/A;   ESOPHAGOGASTRODUODENOSCOPY (EGD) WITH PROPOFOL N/A 06/28/2021   Procedure: ESOPHAGOGASTRODUODENOSCOPY (EGD) WITH PROPOFOL;  Surgeon: Jerene Bears, MD;  Location[facility] ENDOSCOPY;  Service: Gastroenterology;  Laterality: N/A;   ESOPHAGOGASTRODUODENOSCOPY (EGD) WITH PROPOFOL N/A 08/10/2021   Procedure: ESOPHAGOGASTRODUODENOSCOPY (EGD) WITH PROPOFOL;  Surgeon: Jackquline Denmark, MD;  Location: [place];  Service: Gastroenterology;  Laterality: N/A;   ESOPHAGOGASTRODUODENOSCOPY (EGD) WITH PROPOFOL N/A 08/02/2021   Procedure: ESOPHAGOGASTRODUODENOSCOPY (EGD) WITH PROPOFOL;  Surgeon: Lavena Bullion, DO;  Location: [place];  Service: Gastroenterology;  Laterality: N/A;   ESOPHAGOGASTRODUODENOSCOPY (EGD) WITH PROPOFOL N/A 04/02/2022   Procedure: ESOPHAGOGASTRODUODENOSCOPY (EGD) WITH PROPOFOL;  Surgeon: Rush Landmark Telford Nab., MD;  Location: [place];  Service: Gastroenterology;  Laterality: N/A;   ESOPHAGOGASTRODUODENOSCOPY (EGD) WITH PROPOFOL N/A 05/17/2022   Procedure: ESOPHAGOGASTRODUODENOSCOPY (EGD) WITH PROPOFOL;  Surgeon: Rush Landmark Telford Nab., MD;  Location: [facility] ENDOSCOPY;  Service: Gastroenterology;  Laterality: N/A;   EUS N/A 05/17/2022   Procedure: UPPER  ENDOSCOPIC ULTRASOUND (EUS) RADIAL;  Surgeon: Irving Copas., MD;  Location: [facility] ENDOSCOPY;  Service: Gastroenterology;  Laterality: N/A;   FINE NEEDLE ASPIRATION  04/02/2022   Procedure: FINE NEEDLE ASPIRATION (FNA) LINEAR;  Surgeon: Irving Copas., MD;  Location: [place];  Service: Gastroenterology;;   FINE NEEDLE ASPIRATION N/A 05/17/2022   Procedure: FINE NEEDLE ASPIRATION (FNA) LINEAR;  Surgeon: Irving Copas., MD;  Location: [facility];  Service: Gastroenterology;  Laterality: N/A;   HEMOSTASIS CLIP PLACEMENT  05/29/2021   Procedure: HEMOSTASIS CLIP PLACEMENT;  Surgeon: Milus Banister, MD;  Location: [facility] ENDOSCOPY;  Service: Gastroenterology;;   HOT HEMOSTASIS N/A 05/29/2021   Procedure: HOT HEMOSTASIS (ARGON PLASMA COAGULATION/BICAP);  Surgeon: Milus Banister, MD;  Location: [facility] ENDOSCOPY;  Service: Gastroenterology;  Laterality: N/A;   HOT HEMOSTASIS N/A 06/28/2021   Procedure: HOT HEMOSTASIS (ARGON PLASMA COAGULATION/BICAP);  Surgeon: Jerene Bears, MD;  Location: [facility] ENDOSCOPY;  Service: Gastroenterology;  Laterality: N/A;   HOT HEMOSTASIS N/A 08/10/2021   Procedure: HOT HEMOSTASIS (ARGON PLASMA COAGULATION/BICAP);  Surgeon: Jackquline Denmark, MD;  Location: [facility] ENDOSCOPY;  Service: Gastroenterology;  Laterality: N/A;   HOT HEMOSTASIS N/A 08/02/2021   Procedure: HOT HEMOSTASIS (ARGON PLASMA COAGULATION/BICAP);  Surgeon: Lavena Bullion, DO;  Location: [facility] ENDOSCOPY;  Service: Gastroenterology;  Laterality: N/A;   IR PARACENTESIS  07/04/2022   IR PERC TUN PERIT CATH WO PORT S&I /IMAG  05/23/2021   IR REMOVAL TUN  CV CATH W/O FL  06/10/2021   IR US GUIDE VASC ACCESS RIGHT  05/23/2021   LAPAROSCOPY N/A 05/24/2021   Procedure: LAPAROSCOPY DIAGNOSTIC;  Surgeon: Rolm Bookbinder, MD;  Location: [place];  Service: General;  Laterality: N/A;   LAPAROTOMY N/A 05/24/2021   Procedure: EXPLORATORY LAPAROTOMY;  Surgeon: Rolm Bookbinder, MD;  Location: [place];   Service: General;  Laterality: N/A;   LEFT AND RIGHT HEART CATHETERIZATION WITH CORONARY ANGIOGRAM N/A 06/11/2011   Procedure: LEFT AND RIGHT HEART CATHETERIZATION WITH CORONARY ANGIOGRAM;  Surgeon: Larey Dresser, MD;  Location: [facility] CATH LAB;  Service: Cardiovascular;  Laterality: N/A;   PERIPHERAL VASCULAR BALLOON ANGIOPLASTY Right 07/29/2020   Procedure: PERIPHERAL VASCULAR BALLOON ANGIOPLASTY;  Surgeon: Angelia Mould, MD;  Location: [place] [facility];  Service: Cardiovascular;  Laterality: Right;  arm fistula   REMOVAL OF STONES  05/17/2022   Procedure: REMOVAL OF STONES;  Surgeon: Rush Landmark Telford Nab., MD;  Location: [facility];  Service: Gastroenterology;;   REVISON OF ARTERIOVENOUS FISTULA Right 08/08/2020   Procedure: ANEURYSM EXCISION OF RIGHT UPPER EXTREMITY ARTERIOVENOUS FISTULA;  Surgeon: Angelia Mould, MD;  Location: [place];  Service: Vascular;  Laterality: Right;   REVISON OF ARTERIOVENOUS FISTULA Right 03/21/2022   Procedure: EXCISION OF ULCERATED SKIN OF RIGHT ARTERIOVENOUS FISTULA REVISION;  Surgeon: Marty Heck, MD;  Location: [place];  Service: Vascular;  Laterality: Right;   REVISON OF ARTERIOVENOUS FISTULA Right 07/02/2022   Procedure: EXCISION OF ULCERS RIGHT ARTERIOVENOUS FISTULA;  Surgeon: Angelia Mould, MD;  Location: [place];  Service: Vascular;  Laterality: Right;   SCLEROTHERAPY  05/29/2021   Procedure: Clide Deutscher;  Surgeon: Milus Banister, MD;  Location: [facility] ENDOSCOPY;  Service: Gastroenterology;;   Joan Mayans  04/04/2022   Procedure: SPHINCTEROTOMY;  Surgeon: Ladene Artist, MD;  Location: [facility] ENDOSCOPY;  Service: Gastroenterology;;   Lavell Islam REMOVAL  05/29/2021   Procedure: STENT REMOVAL;  Surgeon: Milus Banister, MD;  Location: [place];  Service: Gastroenterology;;   Lavell Islam REMOVAL  05/17/2022   Procedure: STENT REMOVAL;  Surgeon: Irving Copas., MD;  Location: [facility] ENDOSCOPY;  Service: Gastroenterology;;    TEE WITHOUT CARDIOVERSION N/A 12/27/2015   Procedure: TRANSESOPHAGEAL ECHOCARDIOGRAM (TEE);  Surgeon: Sueanne Margarita, MD;  Location: [place];  Service: Cardiovascular;  Laterality: N/A;   UPPER ESOPHAGEAL ENDOSCOPIC ULTRASOUND (EUS) N/A 04/02/2022   Procedure: UPPER ESOPHAGEAL ENDOSCOPIC ULTRASOUND (EUS);  Surgeon: Irving Copas., MD;  Location: [place];  Service: Gastroenterology;  Laterality: N/A;   Family History  Family History  Problem Relation Age of Onset   Alcohol abuse Mother    Liver disease Mother    Cirrhosis Mother    Heart attack Mother    Social History  reports that he quit smoking about 6 years ago. His smoking use included cigarettes. He has a 20.50 pack-year smoking history. He has never used smokeless tobacco. He reports that he does not currently use alcohol after a past usage of about 3.0 standard drinks of alcohol per week. He reports that he does not use drugs. Allergies No Known Allergies Home medications Prior to Admission medications   Medication Sig Start Date End Date Taking? Authorizing Provider  acetaminophen (TYLENOL) 500 MG tablet Take 1,500 mg by mouth 3 (three) times daily as needed for mild pain, moderate pain, fever or headache.   Yes [provider]  allopurinol (ZYLOPRIM) 100 MG tablet TAKE 1 TABLET BY MOUTH EVERY DAY 07/02/22  Yes Paseda, Dewaine Conger, FNP  Lorin Picket  1 GM 210 MG(Fe) tablet Take 210-420 mg by mouth See admin instructions. 420 mg with every meal, 210 mg with each snack. 02/11/21  Yes [provider]  cinacalcet (SENSIPAR) 30 MG tablet Take 1 tablet (30 mg total) by mouth Every Tuesday,Thursday,and Saturday with dialysis. 06/12/22  Yes Shahmehdi, Seyed A, MD  diphenhydramine-acetaminophen (TYLENOL PM) 25-500 MG TABS tablet Take 2 tablets by mouth at bedtime as needed (sleep/pain).   Yes [provider]  folic acid (FOLVITE) 1 MG tablet Take 1 tablet (1 mg total) by mouth daily. 06/04/22  Yes Dorena Dew, FNP  hydroxyurea (HYDREA) 500 MG capsule Take 1 capsule (500 mg total) by mouth daily. 06/01/22  Yes Jegede, Marlena Clipper, MD  Iron-Vitamins (GERITOL PO) Take 15 mLs by mouth daily.   Yes [provider]  lidocaine-prilocaine (EMLA) cream Apply 1 application. topically Every Tuesday,Thursday,and Saturday with dialysis. 09/08/19  Yes [provider]  Oxycodone HCl 10 MG TABS Take 1 tablet (10 mg total) by mouth every 6 (six) hours as needed. 07/23/22  Yes Dorena Dew, FNP  pantoprazole (PROTONIX) 40 MG tablet Take 1 tablet (40 mg total) by mouth daily. 05/17/22  Yes Mansouraty, Telford Nab., MD  torsemide 40 MG TABS Take 80 mg by mouth daily. Patient taking differently: Take 40 mg by mouth in the morning and at bedtime. 06/11/22 07/25/23 Yes Shahmehdi, Valeria Batman, MD  Vitamin D, Ergocalciferol, (DRISDOL) 1.25 MG (50000 UNIT) CAPS capsule Take 1 capsule (50,000 Units total) by mouth once a week. Monday Patient taking differently: Take 50,000 Units by mouth every Monday. 04/08/22  Yes Aline August, MD  tiZANidine (ZANAFLEX) 4 MG tablet TAKE 1/2 TABLET (2MG  TOTAL) BY MOUTH EVERY 8 HOURS AS NEEDED FOR MUSCLE SPASM Patient not taking: Reported on 07/24/2022 07/23/22   Dorena Dew, FNP     Vitals:   07/24/22 0231 07/24/22 0235 07/24/22 0532 07/24/22 0719  BP:  (!) 117/94 108/71   Pulse: 94 85 84   Resp:  (!) 22 20   Temp: 97.8 F (36.6 C)  98.2 F (36.8 C)   TempSrc: Oral  Oral   SpO2: 96%  100% 99%  Weight:   71 kg   Height: 5\' 11"  (1.803 m)      Exam Gen alert, no distress, +facial swelling No rash, cyanosis or gangrene Sclera anicteric, throat clear  +jvd Chest clear bilat to bases, no rales/ wheezing RRR no MRG Abd soft ntnd no mass or ascites +bs GU normal male MS no joint effusions or deformity Ext no LE or UE edema, no wounds or ulcers Neuro is alert, Ox 3 , nf    RUA AVG  +bruit    OP HD: SW TTS  4h 53min  400/500  60.5kg  2/2 bath  RUA AVF   Hep none  - last HD 2/3 came off at 67kg - sensipar 30mg  po tiw - hectorol 8 mcg IV tiw - mircera 225 mcg IV q2, last 1/23, due 2/6 today   Assessment/ Plan: SOB - likely vol overload, he is 10kg up today after a mishap at his last OP dialysis. Typically has large UF goals due to hx SCD and need to drink a lot of water. CXR w/ vasc congestion only. Plan max UF w/ HD today, possibly will need extra HD tomorrow.  Sickle cell disease ESRD - on HD TTS. Has not missed HD. HD today as above.  HTN/ volume - as above Anemia esrd - Hb 6-8  is his baseline. Prbc's per primary team. Due for esa today, will give darbe 22mcg weekly sq on Tuesdays while here.  MBD ckd - CCa in range, phos a bit high. Cont IV vdra, sensipar and auryxia 2 ac tid as binder.       Rob Dalene Robards  MD 07/24/2022, 10:36 AM Recent Labs  Lab 07/23/22 1813 07/24/22 0156 07/24/22 0642  HGB 6.9*  --  6.9*  ALBUMIN 2.9*  --  3.2*  CALCIUM 7.4*  --  8.2*  PHOS  --  6.8* 6.9*  CREATININE 5.82*  --  6.96*  K 3.9  --  5.7*   Inpatient medications:  sodium chloride   Intravenous Once   allopurinol  100 mg Oral Daily   cinacalcet  30 mg Oral Q T,Th,Sa-HD   dextrose  12.5 g Intravenous Once   hydroxyurea  500 mg Oral Daily   oxyCODONE-acetaminophen  1 tablet Oral Once   pantoprazole  40 mg Oral Daily   sodium chloride flush  3 mL Intravenous Q12H   sodium zirconium cyclosilicate  10 g Oral STAT    sodium chloride     ceFEPime (MAXIPIME) IV     sodium chloride, acetaminophen **OR** acetaminophen, HYDROmorphone (DILAUDID) injection, oxyCODONE, sodium chloride flush

## 2022-07-24 NOTE — Progress Notes (Addendum)
Received patient in bed ,before the start of the treatment.Awake,alert and oriented x 4. Patient is blind on both eyes.   Hemodialysis access : Right upper arm graft  that worked well. First venous site infiltrated.  Duration of treatment : 3.5 hours.  Medicines given. Vancomycin 750 mg IV.                             Hectorol 8 mcg                              Oxycodone 10 mg. P.o                             Tylenol 650 mg.                              One unit of blood given =330cc. Fluid removed     5,000 ml  Hemodialysis issue. At times patient 's heart rated spiked to 130 beats per minutes,stayed there for several second and backed to his baseline ,especially  during the last 45 minutes of his treatment.  Swelling on infiltrated site subsided. Ordering renal MD. Made aware.  Hands off to the patient's nurse.

## 2022-07-24 NOTE — Progress Notes (Signed)
07/24/2022 0220 Received pt to room 3E06 from ED.  Pt is A&O, C/O difficulty breathing and Back Pain.  3L o2 applied and will give pain med soon.  Tele monitor applied and CCMD notified.  CHG bath given.  Oriented to room, call light and bed.  Call bell in reach. [place]

## 2022-07-24 NOTE — Progress Notes (Signed)
PROGRESS NOTE Patrick [name]  KJZ:[id number] DOB: 1955/11/03 DOA: 07/23/2022 PCP: [name], MD   Brief Narrative/Hospital Course: 67 y.o. 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  He had left dialysis early on Saturday and stopped taking his meds and has gained some weight since and was noticing increased fatigue, shortness of breath.  In the ED tachycardic 118 tachypneic 29 BP 133/96, saturating well on room air.  Labs showed creatinine 5.8 low bicarb 13, hemoglobin 6.9 g MCV 110.Chest x-ray with cardiomegaly central vascular congestion and small right pleural effusion right lung base atelectasis or infiltrate  Patient was admitted for fluid overload, a.fib w rvr, anemia and Hypoglycemia , possible HCAP.    Subjective: Seen this am Sitting up on bed On RA C/o neck pain, lower back ,face swelling. No edema in legs. FEELS Tired and weak. Asking for stronger pain meds- he says he take 3 of 10 oxycodone whenever he has flare up of pain   Assessment and Plan: Principal Problem:   Atrial fibrillation with RVR ([place]) Active Problems:   Sickle cell anemia (HCC)   Persistent atrial fibrillation (HCC)   ESRD on HD TTS   Chronic systolic CHF (congestive heart failure) (HCC)   Essential hypertension   Prolonged QT interval   Gout   COPD (chronic obstructive pulmonary disease) (HCC)   Chronic pain syndrome   CAP (community acquired pneumonia)   Hypoglycemia   Dyspnea exertion/shortness of breath due to fluid overload and anemia and possible pneumonia: Continue to address underlying condition nephro consult for ESRD continue antibiotics, continue supplemental PRN, currently on room  air.  Community-acquired pneumonia possible HCAP: Continue cefepime/vancomycin follow-up sputum culture respiratory panel in the ED influenza COVID negative.  Blood culture sputum culture ordered, follow-up urine antigen if able to make urine.  Pro-Cal is elevated lactic acidosis 5.7 in the ED now downtrending.  Continue supplemental oxygen, bronchodilators. BP stable afebrile.  Unable to use IV fluids due to fluid overload and ESRD status.  Monitor labs.  Recent Labs  Lab 07/23/22 1813 07/24/22 0156 07/24/22 0642  WBC 9.7  --  8.9  LATICACIDVEN  --  5.7* 3.1*  PROCALCITON  --   --  10.49    Anemia acute on chronic multifactorial sickle cell disease and ESRD Sickle cell anemia Acute on chronic pain crisis: Continue Hydrea. continue ferric citrate.  Baseline hemoglobin 7.4-9.5, he is symptomatic and agreeable for 1 unit prbc will attempt to give 1 unit in HD.changed to IV Dilaudid for pain control. I will inform sickle cell team Recent Labs  Lab 07/23/22 1813 07/24/22 0642  HGB 6.9* 6.9*  HCT 18.7* 18.7*    Hypoglycemia: Blood sugar improved to 170s to 108.  Monitor closely Recent Labs  Lab 07/24/22 0330 07/24/22 0530 07/24/22 0740 07/24/22 1011 07/24/22 1236  GLUCAP 73 108* 87 73 73    Essential hypertension ESRD on HD TTS Metabolic acidosis Hyperphosphatemia: Nephro consulted for HD.Continue Sensipar. Nephro consulted for HD.  BMP shows bicarb improved 13>18 Filed Weights   07/23/22 2112 07/24/22 0532  Weight: 66.2 kg 71 kg    Essential hypertension: Continue torsemide as BP allows History of A-fib not on anticoagulation due to risk of fall and recurrent bleeding Prolonged Qtc: Monitoring telemetry avoid  QT prolonging medication. COPD continue home meds. Chronic pain syndrome on oxycodone PRN> reports she at times takes 3 of those 10 mg oxycodone.  Requesting for more pain medication IV fentanyl ineffective.  Switch to IV Dilaudid with holding parameters for low  BP Gout: continue allopurinol.  DVT prophylaxis: SCDs Start: 07/24/22 0044 Code Status:   Code Status: Full Code Family Communication: plan of care discussed with patient at bedside. Patient status is: Inpatient because of shortness of breath dyspnea Level of care: Progressive   Dispo: The patient is from: home lives alone.cousins are nearby - has no wife/sibling.            Anticipated disposition: Home 2 days Objective: Vitals last 24 hrs: Vitals:   07/24/22 0532 07/24/22 0719 07/24/22 0900 07/24/22 1245  BP: 108/71     Pulse: 84     Resp: 20     Temp: 98.2 F (36.8 C)     TempSrc: Oral     SpO2: 100% 99% 97% 95%  Weight: 71 kg     Height:       Weight change:   Physical Examination: General exam: alert awake,  puffy face, on RA HEENT:Oral mucosa moist, Ear/Nose WNL grossly Respiratory system: bilaterally clear  BS, no use of accessory muscle Cardiovascular system: S1 & S2 +, No JVD. Gastrointestinal system: Abdomen soft,NT,ND, BS+ Nervous System:Alert, awake, moving extremities. Extremities: LE edema neg,distal peripheral pulses palpable.  Skin: No rashes,no icterus. MSK: Normal muscle bulk,tone, power RUE w/ AVF  Medications reviewed:  Scheduled Meds:  sodium chloride   Intravenous Once   allopurinol  100 mg Oral Daily   Chlorhexidine Gluconate Cloth  6 each Topical Q0600   cinacalcet  30 mg Oral Q T,Th,Sa-HD   darbepoetin (ARANESP) injection - DIALYSIS  200 mcg Subcutaneous Q Tue-1800   dextrose  12.5 g Intravenous Once   doxercalciferol  8 mcg Intravenous Q T,Th,Sa-HD   ferric citrate  420 mg Oral TID WC   hydroxyurea  500 mg Oral Daily   pantoprazole  40 mg Oral Daily   sodium chloride flush  3 mL Intravenous Q12H   Continuous Infusions:  sodium chloride     ceFEPime (MAXIPIME) IV     vancomycin      Diet Order             Diet renal with fluid restriction Fluid restriction: Other (see comments); Room service appropriate? Yes; Fluid consistency:  Thin  Diet effective now                  Intake/Output Summary (Last 24 hours) at 07/24/2022 1302 Last data filed at 07/23/2022 2140 Gross per 24 hour  Intake 100 ml  Output --  Net 100 ml   Net IO Since Admission: 100 mL [07/24/22 1302]  Wt Readings from Last 3 Encounters:  07/24/22 71 kg  07/02/22 61.9 kg  06/28/22 64.4 kg     Unresulted Labs (From admission, onward)     Start     Ordered   07/25/22 7408  Basic metabolic panel  Daily,   R     Question:  Specimen collection method  Answer:  IV Team=IV Team collect   07/24/22 0826   07/25/22 0500  CBC  Daily,   R     Question:  Specimen collection method  Answer:  IV Team=IV Team collect   07/24/22 0826   07/24/22 1038  Hepatitis B surface antigen  (New Admission Hemo Labs (Hepatitis B))  Once,   AD       Question:  Specimen collection method  Answer:  IV Team=IV Team collect   07/24/22 1040   07/24/22 1038  Hepatitis B surface antibody,quantitative  (New Admission [facility] Labs (Hepatitis B))  Once,   AD       Question:  Specimen collection method  Answer:  IV Team=IV Team collect   07/24/22 1040   07/23/22 2311  Respiratory (~20 pathogens) panel by PCR  (Respiratory panel by PCR (~20 pathogens, ~24 hr TAT)  w precautions)  Once,   R        07/23/22 2310   07/23/22 2308  Culture, blood (routine x 2) Call MD if unable to obtain prior to antibiotics being given  BLOOD CULTURE X 2,   R     Comments: If blood cultures drawn in Emergency Department - Do not draw and cancel order    07/23/22 2308   07/23/22 2308  Expectorated Sputum Assessment w Gram Stain, Rflx to Resp Cult  Once,   R        07/23/22 2308   07/23/22 2308  Legionella Pneumophila Serogp 1 Ur Ag  Once,   URGENT        07/23/22 2308   07/23/22 2308  Strep pneumoniae urinary antigen  Once,   URGENT        07/23/22 2308   07/23/22 2053  MRSA Next Gen by PCR, Nasal  (MRSA Screening)  Once,   URGENT        07/23/22 2053          Data Reviewed: I have personally  reviewed following labs and imaging studies CBC: Recent Labs  Lab 07/23/22 1813 07/24/22 0642  WBC 9.7 8.9  NEUTROABS 7.5  --   HGB 6.9* 6.9*  HCT 18.7* 18.7*  MCV 110.0* 106.9*  PLT 118* 258*   Basic Metabolic Panel: Recent Labs  Lab 07/23/22 1813 07/24/22 0156 07/24/22 0642  NA 139  --  131*  K 3.9  --  5.7*  CL 98  --  89*  CO2 13*  --  18*  GLUCOSE 34*  --  95  BUN 38*  --  53*  CREATININE 5.82*  --  6.96*  CALCIUM 7.4*  --  8.2*  MG  --  2.1 2.1  PHOS  --  6.8* 6.9*   GFR: Estimated Creatinine Clearance: 10.8 mL/min (A) (by C-G formula based on SCr of 6.96 mg/dL (H)). Liver Function Tests: Recent Labs  Lab 07/23/22 1813 07/24/22 0642  AST 40 47*  ALT 18 27  ALKPHOS 155* 158*  BILITOT 2.9* 3.3*  PROT 6.6 7.4  ALBUMIN 2.9* 3.2*   Recent Labs  Lab 07/24/22 0330 07/24/22 0530 07/24/22 0740 07/24/22 1011 07/24/22 1236  GLUCAP 73 108* 87 73 73   Recent Labs    07/24/22 0156  TSH 7.919*  Sepsis Labs: Recent Labs  Lab 07/24/22 0156 07/24/22 0642  PROCALCITON  --  10.49  LATICACIDVEN 5.7* 3.1*   No results found for this or any previous visit (from the past 240 hour(s)).  Antimicrobials: Anti-infectives (From admission, onward)    Start     Dose/Rate Route Frequency Ordered Stop   07/24/22 2100  ceFEPIme (MAXIPIME) 1 g in sodium chloride 0.9 % 100 mL IVPB        1 g 200 mL/hr over 30 Minutes Intravenous Every 24 hours 07/24/22 0741     07/24/22 1600  vancomycin (VANCOREADY) IVPB 750 mg/150  mL        750 mg 150 mL/hr over 60 Minutes Intravenous Every T-Th-Sa (Hemodialysis) 07/24/22 1046     07/23/22 2100  vancomycin (VANCOREADY) IVPB 1250 mg/250 mL        1,250 mg 166.7 mL/hr over 90 Minutes Intravenous  Once 07/23/22 2052 07/24/22 0134   07/23/22 2045  ceFEPIme (MAXIPIME) 1 g in sodium chloride 0.9 % 100 mL IVPB        1 g 200 mL/hr over 30 Minutes Intravenous  Once 07/23/22 2040 07/23/22 2140      Culture/Microbiology    Component  Value Date/Time   SDES FLUID ABDOMEN 07/04/2022 1116   SDES FLUID ABDOMEN 07/04/2022 1116   SPECREQUEST BOTTLES DRAWN AEROBIC AND ANAEROBIC 07/04/2022 1116   SPECREQUEST NONE 07/04/2022 1116   CULT  07/04/2022 1116    NO GROWTH 5 DAYS Performed at [facility], [place] [address], [place], [place] [address]    REPTSTATUS 07/09/2022 FINAL 07/04/2022 1116   REPTSTATUS 07/04/2022 FINAL 07/04/2022 1116  Radiology Studies: DG Chest 2 View  Result Date: 07/23/2022 CLINICAL DATA:  Shortness of breath. EXAM: CHEST - 2 VIEW COMPARISON:  Chest radiograph dated 06/07/2022. FINDINGS: Cardiomegaly with central vascular congestion. Small right pleural effusion and right lung base atelectasis or infiltrate, new since the prior radiograph. No pneumothorax. No acute osseous pathology. IMPRESSION: 1. Cardiomegaly with central vascular congestion. 2. Small right pleural effusion and right lung base atelectasis or infiltrate. Follow-up recommended. Electronically Signed   By: Anner Crete M.D.   On: 07/23/2022 17:13     LOS: 0 days   Antonieta Pert, MD [facility]  07/24/2022, 1:02 PM

## 2022-07-24 NOTE — Consult Note (Addendum)
Cardiology Consultation   Patient ID: Patrick [name] MRN: [id number]; DOB: January 14, 1956  Admit date: 07/23/2022 Date of Consult: 07/24/2022  PCP:  [name], MD   [place] Providers Cardiologist:  None    Patient Profile:   Patrick [name] is a 67 y.o. male with a hx of ESRD on HD, sickle cell anemia, paroxysmal atrial fibrillation not on anticoagulation, history of GI bleed, chronic systolic heart failure, HTN, COPD, history of SBO s/p small bowel resection, gout, chronic pain, legal blindness who is being seen 07/24/2022 for the evaluation of atrial fibrillation at the request of Dr. Lupita Leash.   History of Present Illness:   Patrick [name] is a 67 year old male with above medical history who has been followed by the advanced heart failure clinic and the atrial fibrillation clinic in the past. Patient was diagnosed with atrial fibrillation in 12/2015 and he was started on coumadin at that time. Echocardiogram on 12/23/15 showed EF 40% with severe hypokinesis of the entire inferior myocardium, mitral valve was thickened and had a possible mass on the anterior leafless. TEE on 12/27/15 confirmed the presence of a very bright, very symmetrical rounded density measuring 1.7x2.3 cm that appeared to originate from the mitral valvd annulus. Reviewed by Dr. Aundra Dubin who felt the mass was calcification. Later, in 09/2017, patient was admitted to the hospital with upper GI bleed and profound anemia with hemoglobin of 4.6. Coumadin was stopped. Again admitted in 05/2018 for acute on chronic systolic heart failure and sickle cell anemia. He was found to be back in afib at that time. Echocardiogram 06/03/18 showed EF 25-30%, mild AI, severe MR, moderate TR. Nuclear stress test on 06/06/18 was reviewed by Dr. Haroldine Laws, and showed no significant ischemia and EF 46%.   Patient underwent DCCV with successful conversion to NSR on 10/06/19, but he was back in atrial flutter on 10/20/19. He was referred  to Afib clinic, and it was felt that patient was not a good candidate for amiodarone. Continued rate control strategy. Echocardiogram in 02/2020 showed EF 40-45%, moderate LVH, mildly reduced RV systolic function, severely dilated LA and RA, moderate-sever MR, severe TR. He was admitted with an upper GI bleed in 07/2020. Admitted to [facility] in 04/2021 with a small bowel obstruction that was treated with conservative management and choledocholithiasis s/p ERCP stone removal + stents to common bile duct and pancreatic duct. Had recurrent SBO in 05/2021, and underwent cholecystectomy and SB resection. Echo in 05/2021 showed EF 40-45%, moderate LVH, mildly reduced RV systolic function, moderately elevated pulmonary artery systolic pressure, severely dilated LA and RA, severe MR, severe TR.   Readmitted in 06/2021 for upper GI bleed. Eliquis discontinued. Admitted in 03/2022 for sickle cell crisis with hemoglobin 6.1, developed elevated trop to 427. It was suspected that trop elevation was due to severe anemia. Most recently, patient was seen by cardiology during an admission in 05/2022 for management of afib with RVR. Patient was admitted for treatment of pneumonia, sickle cell anemia. Echocardiogram on 06/08/22 showed EF 35-40%, moderate LVH, mildly reduced RV systolic function, severely dilated LA and RA, moderate MR, moderate-severe TR.   Patient presented to the ED on 2/5 complaining of sob, back pain, and abdominal distention. He did not miss any HD, but he did leave 30 minutes early on Saturday. Reports gaining 15 lbs in the past week. Labs in the ED showed hemoglobin 6.9, WBC 9.7, platelets 118, Na 139, K 3.9, creatinine 5.82. Lactic acid elevated to  5.7.   Initial EKG showed atrial fibrillation, HR 122 BPM. CXR showed cardiomegaly with central vascular congestion and a small right pleural effusion and right lung base atelectasis or infiltrate. Patient was started on vancomycin and cefepime for  pneumonia.   On interview, patient reports that he has been compliant with dialysis. However, on Saturday, there was a mishap where the machine was not set correctly and he did not get as much fluid off as he was suppose to. Since then, patient has had abdominal distention, shortness of breath, mild ankle edema, orthopnea. Shortness of breath has progressed to where he has a hard time catching his breath after very minimal activity. Denies chest pain, palpitations. As a dry cough. Denies dizziness, lightheadedness. Reports having a mechanical fall about a week ago where he hit his left knee, hip, and rib cage. Continues to have some tenderness to palpation of his left ribs, and some pain when taking a deep breath.   Past Medical History:  Diagnosis Date   A-fib ([place])    Blood dyscrasia    Blood transfusion    "I've had a bunch of them"   CHF (congestive heart failure) ([place])    followed by hf clinic   Chronic kidney disease    Stage 5, dialysis on Tu/Th/Sa   Dialysis patient ([place]) 04/2019   AV fistula (right arm), TTS   Dysrhythmia    A-Fib per patient   Early satiety    Elevated LFTs 04/01/2022   Elevated troponin 04/01/2022   Empyema of gallbladder 05/27/2021   GERD (gastroesophageal reflux disease)    Gout    Legally blind    Metabolic acidosis with increased anion gap and accumulation of organic acids 04/01/2022   Mitral regurgitation    Muscle tightness-Right arm  05/27/2014   Pneumonia    Pulmonary hypertension (HCC)    Shortness of breath dyspnea    Sickle cell disease, type SS (HCC)    Swelling abdomen    Swelling of extremity, left    Swelling of extremity, right    Tricuspid regurgitation     Past Surgical History:  Procedure Laterality Date   A/V FISTULAGRAM Right 07/29/2020   Procedure: A/V FISTULAGRAM - Right Arm;  Surgeon: Angelia Mould, MD;  Location: [place] [facility];  Service: Cardiovascular;  Laterality: Right;   BASCILIC VEIN TRANSPOSITION Right  04/12/2014   Procedure: BASILIC VEIN TRANSPOSITION;  Surgeon: Elam Dutch, MD;  Location: [place];  Service: Vascular;  Laterality: Right;   BILIARY STENT PLACEMENT  04/04/2022   Procedure: BILIARY STENT PLACEMENT;  Surgeon: Ladene Artist, MD;  Location: [place];  Service: Gastroenterology;;   BIOPSY  08/04/2020   Procedure: BIOPSY;  Surgeon: Ladene Artist, MD;  Location: [place];  Service: Gastroenterology;;   BIOPSY  06/28/2021   Procedure: BIOPSY;  Surgeon: Jerene Bears, MD;  Location: [place];  Service: Gastroenterology;;   BIOPSY  04/02/2022   Procedure: BIOPSY;  Surgeon: Irving Copas., MD;  Location: [place];  Service: Gastroenterology;;   BOWEL RESECTION N/A 05/24/2021   Procedure: SMALL BOWEL RESECTION;  Surgeon: Rolm Bookbinder, MD;  Location: [place];  Service: General;  Laterality: N/A;   CARDIOVERSION N/A 06/05/2018   Procedure: CARDIOVERSION;  Surgeon: Jolaine Artist, MD;  Location: [facility] ENDOSCOPY;  Service: Cardiovascular;  Laterality: N/A;   CARDIOVERSION N/A 10/06/2019   Procedure: CARDIOVERSION;  Surgeon: Jolaine Artist, MD;  Location: [place];  Service: Cardiovascular;  Laterality: N/A;  CHOLECYSTECTOMY N/A 05/24/2021   Procedure: OPEN CHOLECYSTECTOMY;  Surgeon: Rolm Bookbinder, MD;  Location: [place];  Service: General;  Laterality: N/A;   COLONOSCOPY WITH PROPOFOL N/A 09/24/2017   Procedure: COLONOSCOPY WITH PROPOFOL;  Surgeon: Jackquline Denmark, MD;  Location: [facility] ENDOSCOPY;  Service: Endoscopy;  Laterality: N/A;   ENDOSCOPIC RETROGRADE CHOLANGIOPANCREATOGRAPHY (ERCP) WITH PROPOFOL N/A 05/17/2022   Procedure: ENDOSCOPIC RETROGRADE CHOLANGIOPANCREATOGRAPHY (ERCP) WITH PROPOFOL;  Surgeon: Rush Landmark Telford Nab., MD;  Location: [facility] ENDOSCOPY;  Service: Gastroenterology;  Laterality: N/A;   ERCP N/A 04/04/2022   Procedure: ENDOSCOPIC RETROGRADE CHOLANGIOPANCREATOGRAPHY (ERCP);  Surgeon: Ladene Artist, MD;  Location: [place];  Service: Gastroenterology;  Laterality: N/A;   ESOPHAGOGASTRODUODENOSCOPY N/A 09/19/2017   Procedure: ESOPHAGOGASTRODUODENOSCOPY (EGD);  Surgeon: Jackquline Denmark, MD;  Location: [facility] ENDOSCOPY;  Service: Endoscopy;  Laterality: N/A;   ESOPHAGOGASTRODUODENOSCOPY N/A 05/29/2021   Procedure: ESOPHAGOGASTRODUODENOSCOPY (EGD);  Surgeon: Milus Banister, MD;  Location: [facility] ENDOSCOPY;  Service: Gastroenterology;  Laterality: N/A;   ESOPHAGOGASTRODUODENOSCOPY (EGD) WITH PROPOFOL N/A 08/04/2020   Procedure: ESOPHAGOGASTRODUODENOSCOPY (EGD) WITH PROPOFOL;  Surgeon: Ladene Artist, MD;  Location: [place];  Service: Gastroenterology;  Laterality: N/A;   ESOPHAGOGASTRODUODENOSCOPY (EGD) WITH PROPOFOL N/A 06/28/2021   Procedure: ESOPHAGOGASTRODUODENOSCOPY (EGD) WITH PROPOFOL;  Surgeon: Jerene Bears, MD;  Location: [facility] ENDOSCOPY;  Service: Gastroenterology;  Laterality: N/A;   ESOPHAGOGASTRODUODENOSCOPY (EGD) WITH PROPOFOL N/A 08/10/2021   Procedure: ESOPHAGOGASTRODUODENOSCOPY (EGD) WITH PROPOFOL;  Surgeon: Jackquline Denmark, MD;  Location: [place];  Service: Gastroenterology;  Laterality: N/A;   ESOPHAGOGASTRODUODENOSCOPY (EGD) WITH PROPOFOL N/A 08/02/2021   Procedure: ESOPHAGOGASTRODUODENOSCOPY (EGD) WITH PROPOFOL;  Surgeon: Lavena Bullion, DO;  Location: [place];  Service: Gastroenterology;  Laterality: N/A;   ESOPHAGOGASTRODUODENOSCOPY (EGD) WITH PROPOFOL N/A 04/02/2022   Procedure: ESOPHAGOGASTRODUODENOSCOPY (EGD) WITH PROPOFOL;  Surgeon: Rush Landmark Telford Nab., MD;  Location: [place];  Service: Gastroenterology;  Laterality: N/A;   ESOPHAGOGASTRODUODENOSCOPY (EGD) WITH PROPOFOL N/A 05/17/2022   Procedure: ESOPHAGOGASTRODUODENOSCOPY (EGD) WITH PROPOFOL;  Surgeon: Rush Landmark Telford Nab., MD;  Location: [facility] ENDOSCOPY;  Service: Gastroenterology;  Laterality: N/A;   EUS N/A 05/17/2022   Procedure: UPPER ENDOSCOPIC ULTRASOUND (EUS) RADIAL;  Surgeon: Irving Copas., MD;   Location: [facility] ENDOSCOPY;  Service: Gastroenterology;  Laterality: N/A;   FINE NEEDLE ASPIRATION  04/02/2022   Procedure: FINE NEEDLE ASPIRATION (FNA) LINEAR;  Surgeon: Irving Copas., MD;  Location: [place];  Service: Gastroenterology;;   FINE NEEDLE ASPIRATION N/A 05/17/2022   Procedure: FINE NEEDLE ASPIRATION (FNA) LINEAR;  Surgeon: Irving Copas., MD;  Location: [facility];  Service: Gastroenterology;  Laterality: N/A;   HEMOSTASIS CLIP PLACEMENT  05/29/2021   Procedure: HEMOSTASIS CLIP PLACEMENT;  Surgeon: Milus Banister, MD;  Location: [facility] ENDOSCOPY;  Service: Gastroenterology;;   HOT HEMOSTASIS N/A 05/29/2021   Procedure: HOT HEMOSTASIS (ARGON PLASMA COAGULATION/BICAP);  Surgeon: Milus Banister, MD;  Location: [facility] ENDOSCOPY;  Service: Gastroenterology;  Laterality: N/A;   HOT HEMOSTASIS N/A 06/28/2021   Procedure: HOT HEMOSTASIS (ARGON PLASMA COAGULATION/BICAP);  Surgeon: Jerene Bears, MD;  Location: [facility] ENDOSCOPY;  Service: Gastroenterology;  Laterality: N/A;   HOT HEMOSTASIS N/A 08/10/2021   Procedure: HOT HEMOSTASIS (ARGON PLASMA COAGULATION/BICAP);  Surgeon: Jackquline Denmark, MD;  Location: [facility] ENDOSCOPY;  Service: Gastroenterology;  Laterality: N/A;   HOT HEMOSTASIS N/A 08/02/2021   Procedure: HOT HEMOSTASIS (ARGON PLASMA COAGULATION/BICAP);  Surgeon: Lavena Bullion, DO;  Location: [facility] ENDOSCOPY;  Service: Gastroenterology;  Laterality: N/A;   IR PARACENTESIS  07/04/2022   IR PERC TUN PERIT CATH WO PORT  S&I /IMAG  05/23/2021   IR REMOVAL TUN CV CATH W/O FL  06/10/2021   IR US GUIDE VASC ACCESS RIGHT  05/23/2021   LAPAROSCOPY N/A 05/24/2021   Procedure: LAPAROSCOPY DIAGNOSTIC;  Surgeon: Rolm Bookbinder, MD;  Location: [place];  Service: General;  Laterality: N/A;   LAPAROTOMY N/A 05/24/2021   Procedure: EXPLORATORY LAPAROTOMY;  Surgeon: Rolm Bookbinder, MD;  Location: [place];  Service: General;  Laterality: N/A;   LEFT AND RIGHT HEART CATHETERIZATION WITH  CORONARY ANGIOGRAM N/A 06/11/2011   Procedure: LEFT AND RIGHT HEART CATHETERIZATION WITH CORONARY ANGIOGRAM;  Surgeon: Larey Dresser, MD;  Location: [facility] CATH LAB;  Service: Cardiovascular;  Laterality: N/A;   PERIPHERAL VASCULAR BALLOON ANGIOPLASTY Right 07/29/2020   Procedure: PERIPHERAL VASCULAR BALLOON ANGIOPLASTY;  Surgeon: Angelia Mould, MD;  Location: [place] [facility];  Service: Cardiovascular;  Laterality: Right;  arm fistula   REMOVAL OF STONES  05/17/2022   Procedure: REMOVAL OF STONES;  Surgeon: Rush Landmark Telford Nab., MD;  Location: [facility];  Service: Gastroenterology;;   REVISON OF ARTERIOVENOUS FISTULA Right 08/08/2020   Procedure: ANEURYSM EXCISION OF RIGHT UPPER EXTREMITY ARTERIOVENOUS FISTULA;  Surgeon: Angelia Mould, MD;  Location: [place];  Service: Vascular;  Laterality: Right;   REVISON OF ARTERIOVENOUS FISTULA Right 03/21/2022   Procedure: EXCISION OF ULCERATED SKIN OF RIGHT ARTERIOVENOUS FISTULA REVISION;  Surgeon: Marty Heck, MD;  Location: [place];  Service: Vascular;  Laterality: Right;   REVISON OF ARTERIOVENOUS FISTULA Right 07/02/2022   Procedure: EXCISION OF ULCERS RIGHT ARTERIOVENOUS FISTULA;  Surgeon: Angelia Mould, MD;  Location: [place];  Service: Vascular;  Laterality: Right;   SCLEROTHERAPY  05/29/2021   Procedure: Clide Deutscher;  Surgeon: Milus Banister, MD;  Location: [facility] ENDOSCOPY;  Service: Gastroenterology;;   Joan Mayans  04/04/2022   Procedure: SPHINCTEROTOMY;  Surgeon: Ladene Artist, MD;  Location: [facility] ENDOSCOPY;  Service: Gastroenterology;;   Lavell Islam REMOVAL  05/29/2021   Procedure: STENT REMOVAL;  Surgeon: Milus Banister, MD;  Location: [place];  Service: Gastroenterology;;   Lavell Islam REMOVAL  05/17/2022   Procedure: STENT REMOVAL;  Surgeon: Irving Copas., MD;  Location: [facility] ENDOSCOPY;  Service: Gastroenterology;;   TEE WITHOUT CARDIOVERSION N/A 12/27/2015   Procedure: TRANSESOPHAGEAL  ECHOCARDIOGRAM (TEE);  Surgeon: Sueanne Margarita, MD;  Location: [place];  Service: Cardiovascular;  Laterality: N/A;   UPPER ESOPHAGEAL ENDOSCOPIC ULTRASOUND (EUS) N/A 04/02/2022   Procedure: UPPER ESOPHAGEAL ENDOSCOPIC ULTRASOUND (EUS);  Surgeon: Irving Copas., MD;  Location: [place];  Service: Gastroenterology;  Laterality: N/A;     Home Medications:  Prior to Admission medications   Medication Sig Start Date End Date Taking? Authorizing Provider  acetaminophen (TYLENOL) 500 MG tablet Take 1,500 mg by mouth 3 (three) times daily as needed for mild pain, moderate pain, fever or headache.   Yes [provider]  allopurinol (ZYLOPRIM) 100 MG tablet TAKE 1 TABLET BY MOUTH EVERY DAY 07/02/22  Yes Paseda, Dewaine Conger, FNP  AURYXIA 1 GM 210 MG(Fe) tablet Take 210-420 mg by mouth See admin instructions. 420 mg with every meal, 210 mg with each snack. 02/11/21  Yes [provider]  cinacalcet (SENSIPAR) 30 MG tablet Take 1 tablet (30 mg total) by mouth Every Tuesday,Thursday,and Saturday with dialysis. 06/12/22  Yes Shahmehdi, Seyed A, MD  diphenhydramine-acetaminophen (TYLENOL PM) 25-500 MG TABS tablet Take 2 tablets by mouth at bedtime as needed (sleep/pain).   Yes [provider]  folic acid (FOLVITE) 1 MG tablet Take 1  tablet (1 mg total) by mouth daily. 06/04/22  Yes Dorena Dew, FNP  hydroxyurea (HYDREA) 500 MG capsule Take 1 capsule (500 mg total) by mouth daily. 06/01/22  Yes Jegede, Marlena Clipper, MD  Iron-Vitamins (GERITOL PO) Take 15 mLs by mouth daily.   Yes [provider]  lidocaine-prilocaine (EMLA) cream Apply 1 application. topically Every Tuesday,Thursday,and Saturday with dialysis. 09/08/19  Yes [provider]  Oxycodone HCl 10 MG TABS Take 1 tablet (10 mg total) by mouth every 6 (six) hours as needed. 07/23/22  Yes Dorena Dew, FNP  pantoprazole (PROTONIX) 40 MG tablet Take 1 tablet (40 mg total) by mouth daily.  05/17/22  Yes Mansouraty, Telford Nab., MD  torsemide 40 MG TABS Take 80 mg by mouth daily. Patient taking differently: Take 40 mg by mouth in the morning and at bedtime. 06/11/22 07/25/23 Yes Shahmehdi, Valeria Batman, MD  Vitamin D, Ergocalciferol, (DRISDOL) 1.25 MG (50000 UNIT) CAPS capsule Take 1 capsule (50,000 Units total) by mouth once a week. Monday Patient taking differently: Take 50,000 Units by mouth every Monday. 04/08/22  Yes Aline August, MD  tiZANidine (ZANAFLEX) 4 MG tablet TAKE 1/2 TABLET (2MG  TOTAL) BY MOUTH EVERY 8 HOURS AS NEEDED FOR MUSCLE SPASM Patient not taking: Reported on 07/24/2022 07/23/22   Dorena Dew, FNP    Inpatient Medications: Scheduled Meds:  sodium chloride   Intravenous Once   allopurinol  100 mg Oral Daily   cinacalcet  30 mg Oral Q T,Th,Sa-HD   dextrose  12.5 g Intravenous Once   hydroxyurea  500 mg Oral Daily   oxyCODONE-acetaminophen  1 tablet Oral Once   pantoprazole  40 mg Oral Daily   sodium chloride flush  3 mL Intravenous Q12H   sodium zirconium cyclosilicate  10 g Oral STAT   Continuous Infusions:  sodium chloride     ceFEPime (MAXIPIME) IV     PRN Meds: sodium chloride, acetaminophen **OR** acetaminophen, HYDROmorphone (DILAUDID) injection, oxyCODONE, sodium chloride flush  Allergies:   No Known Allergies  Social History:   Social History   Socioeconomic History   Marital status: Divorced    Spouse name: Not on file   Number of children: Not on file   Years of education: Not on file   Highest education level: Not on file  Occupational History   Occupation: disabled  Tobacco Use   Smoking status: Former    Packs/day: 0.50    Years: 41.00    Total pack years: 20.50    Types: Cigarettes    Quit date: 12/08/2015    Years since quitting: 6.6   Smokeless tobacco: Never  Vaping Use   Vaping Use: Never used  Substance and Sexual Activity   Alcohol use: Not Currently    Alcohol/week: 3.0 standard drinks of alcohol    Types: 1  Glasses of wine, 2 Cans of beer per week    Comment: none since starting dialysis 06/2021   Drug use: No   Sexual activity: Not Currently  Other Topics Concern   Not on file  Social History Narrative   Not on file   Social Determinants of Health   Financial Resource Strain: Low Risk  (06/02/2021)   Overall Financial Resource Strain (CARDIA)    Difficulty of Paying Living Expenses: Not very hard  Food Insecurity: No Food Insecurity (07/24/2022)   Hunger Vital Sign    Worried About Running Out of Food in the Last Year: Never true    Ran Out of Food  in the Last Year: Never true  Transportation Needs: No Transportation Needs (07/24/2022)   PRAPARE - Hydrologist (Medical): No    Lack of Transportation (Non-Medical): No  Physical Activity: Sufficiently Active (10/01/2019)   Exercise Vital Sign    Days of Exercise per Week: 5 days    Minutes of Exercise per Session: 90 min  Stress: Not on file  Social Connections: Not on file  Intimate Partner Violence: Not At Risk (07/24/2022)   Humiliation, Afraid, Rape, and Kick questionnaire    Fear of Current or Ex-Partner: No    Emotionally Abused: No    Physically Abused: No    Sexually Abused: No    Family History:    Family History  Problem Relation Age of Onset   Alcohol abuse Mother    Liver disease Mother    Cirrhosis Mother    Heart attack Mother      ROS:  Please see the history of present illness.   All other ROS reviewed and negative.     Physical Exam/Data:   Vitals:   07/24/22 0231 07/24/22 0235 07/24/22 0532 07/24/22 0719  BP:  (!) 117/94 108/71   Pulse: 94 85 84   Resp:  (!) 22 20   Temp: 97.8 F (36.6 C)  98.2 F (36.8 C)   TempSrc: Oral  Oral   SpO2: 96%  100% 99%  Weight:   71 kg   Height: 5\' 11"  (1.803 m)       Intake/Output Summary (Last 24 hours) at 07/24/2022 1019 Last data filed at 07/23/2022 2140 Gross per 24 hour  Intake 100 ml  Output --  Net 100 ml      07/24/2022     5:32 AM 07/23/2022    9:12 PM 07/02/2022    6:52 AM  Last 3 Weights  Weight (lbs) 156 lb 9.6 oz 146 lb 136 lb 6.4 oz  Weight (kg) 71.033 kg 66.225 kg 61.871 kg     Body mass index is 21.84 kg/m.  General:  Thin, chronically ill appearing gentleman. Laying in bed with head elevated  HEENT: normal Neck: + JVD Vascular: Radial pulses 2+ bilaterally Cardiac:  normal S1, S2; irregular rate and rhythm. Faint systolic murmur at left sternal border  Lungs:  crackles in bilateral lung bases. Normal WOB on 2L via [place]. Becomes SOB when moving in the bed  Abd: distended  Ext: trace edema in BLE Musculoskeletal:  No deformities, BUE and BLE strength normal and equal Skin: warm and dry  Neuro:  CNs 2-12 intact, no focal abnormalities noted Psych:  Normal affect   EKG:  The EKG was personally reviewed and demonstrates:  Atrial fibrillation, HR 122 BPM  Telemetry:  Telemetry was personally reviewed and demonstrates:  Atrial fibrillation, HR in the 80s-90s   Relevant CV Studies:  Echocardiogram 06/08/22 1. Left ventricular ejection fraction, by estimation, is 35 to 40%. The  left ventricle has moderately decreased function. The left ventricle  demonstrates global hypokinesis. There is moderate concentric left  ventricular hypertrophy. Left ventricular  diastolic parameters are indeterminate.   2. Right ventricular systolic function is mildly reduced. The right  ventricular size is moderately enlarged. There is severely elevated  pulmonary artery systolic pressure.   3. Left atrial size was severely dilated.   4. Right atrial size was severely dilated.   5. The mitral valve is degenerative. Moderate mitral valve regurgitation.   6. Tricuspid valve regurgitation is moderate to severe.  7. Aortic valve regurgitation is not visualized. Aortic valve  sclerosis/calcification is present, without any evidence of aortic  stenosis.   8. There is mild dilatation of the aortic root, measuring 38 mm.   9.  The inferior vena cava is dilated in size with <50% respiratory  variability, suggesting right atrial pressure of 15 mmHg.   Laboratory Data:  High Sensitivity Troponin:  No results for input(s): "TROPONINIHS" in the last 720 hours.   Chemistry Recent Labs  Lab 07/23/22 1813 07/24/22 0156 07/24/22 0642  NA 139  --  131*  K 3.9  --  5.7*  CL 98  --  89*  CO2 13*  --  18*  GLUCOSE 34*  --  95  BUN 38*  --  53*  CREATININE 5.82*  --  6.96*  CALCIUM 7.4*  --  8.2*  MG  --  2.1 2.1  GFRNONAA 10*  --  8*  ANIONGAP 28*  --  24*    Recent Labs  Lab 07/23/22 1813 07/24/22 0642  PROT 6.6 7.4  ALBUMIN 2.9* 3.2*  AST 40 47*  ALT 18 27  ALKPHOS 155* 158*  BILITOT 2.9* 3.3*   Lipids No results for input(s): "CHOL", "TRIG", "HDL", "LABVLDL", "LDLCALC", "CHOLHDL" in the last 168 hours.  Hematology Recent Labs  Lab 07/23/22 1813 07/24/22 0156 07/24/22 0642  WBC 9.7  --  8.9  RBC 1.70* 1.75* 1.75*  HGB 6.9*  --  6.9*  HCT 18.7*  --  18.7*  MCV 110.0*  --  106.9*  MCH 40.6*  --  39.4*  MCHC 36.9*  --  36.9*  RDW 18.2*  --  17.3*  PLT 118*  --  131*   Thyroid  Recent Labs  Lab 07/24/22 0156  TSH 7.919*    BNPNo results for input(s): "BNP", "PROBNP" in the last 168 hours.  DDimer No results for input(s): "DDIMER" in the last 168 hours.   Radiology/Studies:  DG Chest 2 View  Result Date: 07/23/2022 CLINICAL DATA:  Shortness of breath. EXAM: CHEST - 2 VIEW COMPARISON:  Chest radiograph dated 06/07/2022. FINDINGS: Cardiomegaly with central vascular congestion. Small right pleural effusion and right lung base atelectasis or infiltrate, new since the prior radiograph. No pneumothorax. No acute osseous pathology. IMPRESSION: 1. Cardiomegaly with central vascular congestion. 2. Small right pleural effusion and right lung base atelectasis or infiltrate. Follow-up recommended. Electronically Signed   By: Anner Crete M.D.   On: 07/23/2022 17:13     Assessment and Plan:    Permanent Atrial Fibrillation  - Patient not on AV nodal blocking medications at home. Note on AC due to chronic anemia, history of GI bleed - Patient currently admitted for treatment of volume overload (presented complaining of 15 lb weight gain, CXR with central vascular congestion). Patient is grossly volume overloaded on exam. Also being treated for possible CAP. Undergoing HD today  - While patient had elevated HR on admission, per telemetry HR is now in the 80s-90s. Patient is asymptomatic. No need to start rate controlling medications at this time. Suspect HR will further improve as patient gets some of the extra fluid off in HD   Acute on chronic systolic heart failure  Moderate-Severe TR  Moderate MR - Most recent echocardiogram from 06/08/22 showed EF 35-40%, moderate LVH, mildly reduced RV systolic function, moderate-severe TR, moderate MR  - Patient is not a candidate for valvular repair/replacement  - Volume managed by HD  - GDMT limited by renal function, soft  BP. Will not try to add GDMT at this time because I do not want to cause hypotension during HD   Otherwise per primary  - CAP - ESRD on HD  - COPD - Sickle cell anemia     Risk Assessment/Risk Scores:   New York Heart Association (NYHA) Functional Class NYHA Class IV  CHA2DS2-VASc Score = 3   This indicates a 3.2% annual risk of stroke. The patient's score is based upon: CHF History: 1 HTN History: 1 Diabetes History: 0 Stroke History: 0 Vascular Disease History: 1 Age Score: 0 Gender Score: 0     For questions or updates, please contact [place] Please consult [email or web address] for contact info under    Signed, Margie Billet, PA-C  07/24/2022 10:19 AM  Patient seen and examined with KJ PA.  Agree as above, with the following exceptions and changes as noted below. Pt seen for . Gen: NAD, CV: irregular and tachycardic no murmurs, Lungs: clear, Abd: soft, Extrem: Warm, well perfused,  no edema, Neuro/Psych: alert and oriented x 3, normal mood and affect. All available labs, radiology testing, previous records reviewed.  Patient seen today for permanent atrial fibrillation with rapid ventricular response in the setting of possible pneumonia as well as volume overload due to an outpatient dialysis variation in volume management.  Complex medical history including sickle cell disease end-stage renal disease, hypertension, anemia.  It is not surprising that he has elevated rate and his atrial fibrillation, however it is not impacting his current dialysis run hemodynamically, and overall the patient has no significant symptoms or questions.  Previously today his rates normalized without medical therapy.  Reasonable to observe this.  Can consider rate controlling agents if needed, however I suspect when the patient finishes his dialysis encounter, rates may normalize as he is resting.  Elouise Munroe, MD 07/24/22 6:01 PM

## 2022-07-24 NOTE — Progress Notes (Signed)
Heart Failure Navigator Progress Note  Assessed for Heart & Vascular TOC clinic readiness.  Patient does not meet criteria due to ESRD on hemodialysis.   Navigator will sign off at this time.    Dianne Whelchel, BSN, RN Heart Failure Nurse Navigator Secure Chat Only   

## 2022-07-25 DIAGNOSIS — I4891 Unspecified atrial fibrillation: Secondary | ICD-10-CM | POA: Diagnosis not present

## 2022-07-25 LAB — BASIC METABOLIC PANEL
Anion gap: 17 — ABNORMAL HIGH (ref 5–15)
BUN: 29 mg/dL — ABNORMAL HIGH (ref 8–23)
CO2: 23 mmol/L (ref 22–32)
Calcium: 7.9 mg/dL — ABNORMAL LOW (ref 8.9–10.3)
Chloride: 94 mmol/L — ABNORMAL LOW (ref 98–111)
Creatinine, Ser: 4.28 mg/dL — ABNORMAL HIGH (ref 0.61–1.24)
GFR, Estimated: 15 mL/min — ABNORMAL LOW (ref >60)
Glucose, Bld: 96 mg/dL (ref 70–99)
Potassium: 3.6 mmol/L (ref 3.5–5.1)
Sodium: 134 mmol/L — ABNORMAL LOW (ref 135–145)

## 2022-07-25 LAB — CBC
HCT: 20.2 % — ABNORMAL LOW (ref 39.0–52.0)
Hemoglobin: 7.5 g/dL — ABNORMAL LOW (ref 13.0–17.0)
MCH: 38.1 pg — ABNORMAL HIGH (ref 26.0–34.0)
MCHC: 37.1 g/dL — ABNORMAL HIGH (ref 30.0–36.0)
MCV: 102.5 fL — ABNORMAL HIGH (ref 80.0–100.0)
Platelets: 136 10*3/uL — ABNORMAL LOW (ref 150–400)
RBC: 1.97 MIL/uL — ABNORMAL LOW (ref 4.22–5.81)
RDW: 19.9 % — ABNORMAL HIGH (ref 11.5–15.5)
WBC: 7.5 10*3/uL (ref 4.0–10.5)
nRBC: 6.3 % — ABNORMAL HIGH (ref 0.0–0.2)

## 2022-07-25 LAB — HEPATITIS B SURFACE ANTIBODY, QUANTITATIVE: Hep B S AB Quant (Post): 282.6 m[IU]/mL (ref >9.9)

## 2022-07-25 LAB — EXPECTORATED SPUTUM ASSESSMENT W GRAM STAIN, RFLX TO RESP C

## 2022-07-25 MED ORDER — CHLORHEXIDINE GLUCONATE CLOTH 2 % EX PADS
6.0000 | MEDICATED_PAD | Freq: Every day | CUTANEOUS | Status: DC
  Administered 2022-07-27: 6 via TOPICAL

## 2022-07-25 MED ORDER — METOPROLOL TARTRATE 5 MG/5ML IV SOLN
2.5000 mg | Freq: Three times a day (TID) | INTRAVENOUS | Status: DC | PRN
  Administered 2022-07-25 (×2): 2.5 mg via INTRAVENOUS
  Filled 2022-07-25 (×2): qty 5

## 2022-07-25 NOTE — Progress Notes (Signed)
PROGRESS NOTE Patrick [name]  GQQ:[id number] DOB: [birth year] DOA: 07/23/2022 PCP: [name], MD   Brief Narrative/Hospital Course: 67 y.o. 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  He had left dialysis early on Saturday and stopped taking his meds and has gained some weight since and was noticing increased fatigue, shortness of breath. In the ED tachycardic 118 tachypneic 29 BP 133/96, saturating well on room air.  Labs showed creatinine 5.8 low bicarb 13, hemoglobin 6.9 g MCV 110.Chest x-ray with cardiomegaly central vascular congestion and small right pleural effusion right lung base atelectasis or infiltrate Patient was admitted for fluid overload, a.fib w rvr, anemia and Hypoglycemia , possible HCAP.  Seen by nephrology underwent dialysis 2/6.    Subjective: Patient seen and examined this morning Overnight patient afebrile having A-fib with RVR up to 150s but not sustained.   Saturating well on room air.  BP has been soft 90s to low 136 coming up He reports he feels better today-pain is controlled on IV Dilaudid  Assessment and Plan: Principal Problem:   Atrial fibrillation with RVR (HCC) Active Problems:   Sickle cell anemia (HCC)   Persistent atrial fibrillation (HCC)   ESRD on HD TTS   Chronic systolic CHF (congestive heart failure) (HCC)   Essential hypertension   Prolonged QT interval   Gout   COPD (chronic obstructive pulmonary disease) (HCC)   Chronic pain syndrome   CAP (community acquired pneumonia)   Hypoglycemia   Fluid overload presenting with dyspnea exertion/shortness of breath: Patient symptomatic with fluid overload also dyspneic weak with anemia A-fib RVR ESRD deconditioning  comorbidities.  Continue to address underlying condition HD per nephrology, antibiotics, hemoglobin improved with blood transfusion, monitor in telemetry.  Overall clinically improving  Acute on chronic systolic CHF with EF 67-12% Moderate-severe TR/moderate MR: Cardiology following address fluid status with dialysis.  Monitor intake output Daily weight  Community-acquired pneumonia possible HCAP: Blood culture no growth so far,Pending sputum culture. RVP negative, MRSA swab negative.influenza COVID negativePro-Cal is elevated lactic acidosis 5.7> down to 3.1- hco now resolved.  DC vancomycin continue IV cefepime.  Currently on room air overall improving Recent Labs  Lab 07/23/22 1813 07/24/22 0156 07/24/22 0642 07/25/22 0054  WBC 9.7  --  8.9 7.5  LATICACIDVEN  --  5.7* 3.1*  --   PROCALCITON  --   --  10.49  --     Anemia acute on chronic multifactorial sickle cell disease and ESRD Sickle cell anemia Acute on chronic pain crisis: Baseline hemoglobin 7.4-9.5, he is symptomatic and s/p 1 unit prbc 2/6.Continue Hydrea. continue ferric citrate.  Continue pain management with Dilaudid PRN as BP tolerates, on chronic oxy at home. I informed sickle cell team 07/24/22. Recent Labs  Lab 07/23/22 1813 07/24/22 0642 07/25/22 0054  HGB 6.9* 6.9* 7.5*  HCT 18.7* 18.7* 20.2*    Hypoglycemia: Resolved Recent Labs  Lab 07/24/22 0530 07/24/22 0740 07/24/22 1011 07/24/22 1236 07/24/22 1410  GLUCAP 108* 87 73 73 89    Essential hypertension-BP soft/hypotension chronic ESRD on HD TTS Metabolic acidosis Hyperphosphatemia: Nephro following for HD.Continue Sensipar.  Continue to address volume with HD, torsemide as BP allows.Monitor weight/intake output Daily as below Net IO Since Admission: -4,550 mL [07/25/22 1134]  Filed  Weights   07/24/22 1433 07/24/22 1911 07/25/22 0416  Weight: 71.1 kg 67.4 kg 64.7 kg    Permanent A-fib presenting with RVR : This morning having RVR up to 158 but not  sustained, previously was on Coreg that was discontinued due to soft BP, cardiology input appreciated advised to continue Lopressor as needed while admitted.  Previously Coreg was discontinued due to soft blood pressure.Not on anticoagulation due to risk of fall and recurrent bleeding  Prolonged Qtc: Monitoring telemetry avoid QT prolonging medication. COPD continue home meds. Chronic pain syndrome on oxycodone PRN> reports she at times takes 3 of those 10 mg oxycodone.  Requesting for more pain medication IV fentanyl ineffective.  Switch to IV Dilaudid with holding parameters for low BP Gout: continue allopurinol.  DVT prophylaxis: SCDs Start: 07/24/22 0044 Code Status:   Code Status: Full Code Family Communication: plan of care discussed with patient at bedside. Patient status is: Inpatient because of shortness of breath dyspnea Level of care: Progressive   Dispo: The patient is from: home lives alone.cousins are nearby - has no wife/sibling.            Anticipated disposition: Home 1-2 days Objective: Vitals last 24 hrs: Vitals:   07/24/22 1922 07/24/22 2135 07/25/22 0416 07/25/22 0820  BP: 102/72 117/81 94/69 123/82  Pulse: (!) 122 (!) 120 (!) 133   Resp: 19 18 18    Temp:  98 F (36.7 C) 99.1 F (37.3 C)   TempSrc:  Oral Oral   SpO2: 97% 98% 94%   Weight:   64.7 kg   Height:       Weight change: 4.875 kg  Physical Examination: General exam: AAox3, appears older than stated, on room air HEENT:Oral mucosa moist, Ear/Nose WNL grossly, dentition normal. Respiratory system: bilaterally diminished BS, no use of accessory muscle Cardiovascular system: S1 & S2 +, regular rate,. Gastrointestinal system: Abdomen soft, NT,ND,BS+ Nervous System:Alert, awake, moving extremities and grossly nonfocal Extremities: LE ankle edema trace, lower extremities warm Skin: No rashes,no icterus. MSK: Normal muscle bulk,tone, power   Medications reviewed:  Scheduled Meds:  sodium chloride    Intravenous Once   allopurinol  100 mg Oral Daily   Chlorhexidine Gluconate Cloth  6 each Topical Q0600   cinacalcet  30 mg Oral Q T,Th,Sa-HD   darbepoetin (ARANESP) injection - DIALYSIS  200 mcg Subcutaneous Q Tue-1800   dextrose  12.5 g Intravenous Once   doxercalciferol  8 mcg Intravenous Q T,Th,Sa-HD   ferric citrate  420 mg Oral TID WC   hydroxyurea  500 mg Oral Daily   pantoprazole  40 mg Oral Daily   sodium chloride flush  3 mL Intravenous Q12H   Continuous Infusions:  sodium chloride     ceFEPime (MAXIPIME) IV 1 g (07/24/22 2249)    Diet Order             Diet renal with fluid restriction Fluid restriction: Other (see comments); Room service appropriate? Yes; Fluid consistency: Thin  Diet effective now                  Intake/Output Summary (Last 24 hours) at 07/25/2022 1134 Last data filed at 07/24/2022 1911 Gross per 24 hour  Intake 350 ml  Output 5000 ml  Net -4650 ml   Net IO Since Admission: -4,550 mL [07/25/22 1134]  Wt Readings from Last 3 Encounters:  07/25/22 64.7 kg  07/02/22 61.9 kg  06/28/22 64.4 kg     Unresulted Labs (From admission,  onward)     Start     Ordered   07/25/22 8756  Basic metabolic panel  Daily,   R     Question:  Specimen collection method  Answer:  IV Team=IV Team collect   07/24/22 0826   07/25/22 0500  CBC  Daily,   R     Question:  Specimen collection method  Answer:  IV Team=IV Team collect   07/24/22 0826   07/25/22 0054  Pathologist smear review  Once,   R        07/25/22 0054   07/23/22 2308  Expectorated Sputum Assessment w Gram Stain, Rflx to Resp Cult  Once,   R        07/23/22 2308   07/23/22 2308  Legionella Pneumophila Serogp 1 Ur Ag  Once,   URGENT        07/23/22 2308   07/23/22 2308  Strep pneumoniae urinary antigen  Once,   URGENT        07/23/22 2308          Data Reviewed: I have personally reviewed following labs and imaging studies CBC: Recent Labs  Lab 07/23/22 1813 07/24/22 0642 07/25/22 0054   WBC 9.7 8.9 7.5  NEUTROABS 7.5  --   --   HGB 6.9* 6.9* 7.5*  HCT 18.7* 18.7* 20.2*  MCV 110.0* 106.9* 102.5*  PLT 118* 131* 433*   Basic Metabolic Panel: Recent Labs  Lab 07/23/22 1813 07/24/22 0156 07/24/22 0642 07/24/22 1558 07/25/22 0054  NA 139  --  131* 129* 134*  K 3.9  --  5.7* 5.6* 3.6  CL 98  --  89* 89* 94*  CO2 13*  --  18* 17* 23  GLUCOSE 34*  --  95 83 96  BUN 38*  --  53* 58* 29*  CREATININE 5.82*  --  6.96* 7.20* 4.28*  CALCIUM 7.4*  --  8.2* 8.2* 7.9*  MG  --  2.1 2.1  --   --   PHOS  --  6.8* 6.9* 6.8*  --    GFR: Estimated Creatinine Clearance: 16 mL/min (A) (by C-G formula based on SCr of 4.28 mg/dL (H)). Liver Function Tests: Recent Labs  Lab 07/23/22 1813 07/24/22 0642 07/24/22 1558  AST 40 47*  --   ALT 18 27  --   ALKPHOS 155* 158*  --   BILITOT 2.9* 3.3*  --   PROT 6.6 7.4  --   ALBUMIN 2.9* 3.2* 3.2*   Recent Labs  Lab 07/24/22 0530 07/24/22 0740 07/24/22 1011 07/24/22 1236 07/24/22 1410  GLUCAP 108* 87 73 73 89   Recent Labs    07/24/22 0156  TSH 7.919*  Sepsis Labs: Recent Labs  Lab 07/24/22 0156 07/24/22 0642  PROCALCITON  --  10.49  LATICACIDVEN 5.7* 3.1*   Recent Results (from the past 240 hour(s))  Culture, blood (routine x 2) Call MD if unable to obtain prior to antibiotics being given     Status: None (Preliminary result)   Collection Time: 07/24/22 12:53 AM   Specimen: BLOOD LEFT HAND  Result Value Ref Range Status   Specimen Description BLOOD LEFT HAND  Final   Special Requests   Final    BOTTLES DRAWN AEROBIC AND ANAEROBIC Blood Culture results may not be optimal due to an excessive volume of blood received in culture bottles   Culture   Final    NO GROWTH 1 DAY Performed at [facility], [place]  [address], [place], [place] [address]    Report Status PENDING  Incomplete  MRSA Next Gen by PCR, Nasal     Status: None   Collection Time: 07/24/22  1:00 PM   Specimen: Nasal Mucosa; Nasal Swab  Result  Value Ref Range Status   MRSA by PCR Next Gen NOT DETECTED NOT DETECTED Final    Comment: (NOTE) The GeneXpert MRSA Assay (FDA approved for NASAL specimens only), is one component of a comprehensive MRSA colonization surveillance program. It is not intended to diagnose MRSA infection nor to guide or monitor treatment for MRSA infections. Test performance is not FDA approved in patients less than 98 years old. Performed at [facility], [place] [address] [place], [place] [address]   Respiratory (~20 pathogens) panel by PCR     Status: None   Collection Time: 07/24/22  1:00 PM   Specimen: Nasal Mucosa; Respiratory  Result Value Ref Range Status   Adenovirus NOT DETECTED NOT DETECTED Final   Coronavirus 229E NOT DETECTED NOT DETECTED Final    Comment: (NOTE) The Coronavirus on the Respiratory Panel, DOES NOT test for the novel  Coronavirus (2019 nCoV)    Coronavirus HKU1 NOT DETECTED NOT DETECTED Final   Coronavirus NL63 NOT DETECTED NOT DETECTED Final   Coronavirus OC43 NOT DETECTED NOT DETECTED Final   Metapneumovirus NOT DETECTED NOT DETECTED Final   Rhinovirus / Enterovirus NOT DETECTED NOT DETECTED Final   Influenza A NOT DETECTED NOT DETECTED Final   Influenza B NOT DETECTED NOT DETECTED Final   Parainfluenza Virus 1 NOT DETECTED NOT DETECTED Final   Parainfluenza Virus 2 NOT DETECTED NOT DETECTED Final   Parainfluenza Virus 3 NOT DETECTED NOT DETECTED Final   Parainfluenza Virus 4 NOT DETECTED NOT DETECTED Final   Respiratory Syncytial Virus NOT DETECTED NOT DETECTED Final   Bordetella pertussis NOT DETECTED NOT DETECTED Final   Bordetella Parapertussis NOT DETECTED NOT DETECTED Final   Chlamydophila pneumoniae NOT DETECTED NOT DETECTED Final   Mycoplasma pneumoniae NOT DETECTED NOT DETECTED Final    Comment: Performed at [facility], [place]. [address], [place], [place] [address]    Antimicrobials: Anti-infectives (From admission, onward)    Start     Dose/Rate  Route Frequency Ordered Stop   07/24/22 2100  ceFEPIme (MAXIPIME) 1 g in sodium chloride 0.9 % 100 mL IVPB        1 g 200 mL/hr over 30 Minutes Intravenous Every 24 hours 07/24/22 0741     07/24/22 1600  vancomycin (VANCOREADY) IVPB 750 mg/150 mL  Status:  Discontinued        750 mg 150 mL/hr over 60 Minutes Intravenous Every T-Th-Sa (Hemodialysis) 07/24/22 1046 07/25/22 0949   07/23/22 2100  vancomycin (VANCOREADY) IVPB 1250 mg/250 mL        1,250 mg 166.7 mL/hr over 90 Minutes Intravenous  Once 07/23/22 2052 07/24/22 0134   07/23/22 2045  ceFEPIme (MAXIPIME) 1 g in sodium chloride 0.9 % 100 mL IVPB        1 g 200 mL/hr over 30 Minutes Intravenous  Once 07/23/22 2040 07/23/22 2140      Culture/Microbiology    Component Value Date/Time   SDES BLOOD LEFT HAND 07/24/2022 0053   SPECREQUEST  07/24/2022 0053    BOTTLES DRAWN AEROBIC AND ANAEROBIC Blood Culture results may not be optimal due to an excessive volume of blood received in culture bottles   CULT  07/24/2022 0053    NO GROWTH 1 DAY Performed  at [facility], [place] [address], [place], [place] [address]    REPTSTATUS PENDING 07/24/2022 0053  Radiology Studies: DG Chest 2 View  Result Date: 07/23/2022 CLINICAL DATA:  Shortness of breath. EXAM: CHEST - 2 VIEW COMPARISON:  Chest radiograph dated 06/07/2022. FINDINGS: Cardiomegaly with central vascular congestion. Small right pleural effusion and right lung base atelectasis or infiltrate, new since the prior radiograph. No pneumothorax. No acute osseous pathology. IMPRESSION: 1. Cardiomegaly with central vascular congestion. 2. Small right pleural effusion and right lung base atelectasis or infiltrate. Follow-up recommended. Electronically Signed   By: Anner Crete M.D.   On: 07/23/2022 17:13     LOS: 1 day   Antonieta Pert, MD [facility]  07/25/2022, 11:34 AM

## 2022-07-25 NOTE — Progress Notes (Signed)
[facility] Progress Note  Subjective: seen in room, had HD yest w/ 5 L off, feeling much better today. More energy and swelling and SOB resolved.   Vitals:   07/24/22 1922 07/24/22 2135 07/25/22 0416 07/25/22 0820  BP: 102/72 117/81 94/69 123/82  Pulse: (!) 122 (!) 120 (!) 133   Resp: 19 18 18    Temp:  98 F (36.7 C) 99.1 F (37.3 C)   TempSrc:  Oral Oral   SpO2: 97% 98% 94%   Weight:   64.7 kg   Height:        Exam: Gen alert, no distress No rash, cyanosis or gangrene Sclera anicteric, throat clear  Chest clear bilat to bases RRR no MRG Abd soft ntnd no mass or ascites +bs Ext no LE edema Neuro is alert, Ox 3 , nf    RUA AVG  +bruit      OP HD: SW TTS  4h 62min  400/500  60.5kg  2/2 bath  RUA AVF  Hep none  - last HD 2/3 came off at 67kg - sensipar 30mg  po tiw - hectorol 8 mcg IV tiw - mircera 225 mcg IV q2, last 1/23, due 2/6 today     Assessment/ Plan: SOB - was up 10kg on admission, pulled 5 L w/ HD yesterday. Looks and feeling much better. Plan HD tomorrow if pt is still here.  Sickle cell disease ESRD - on HD TTS. Had HD here yesterday on schedule. Next HD tomorrow.  HTN/ volume - as above Anemia esrd - Hb 6-8 is his baseline. Prbc's per primary team. Due for esa on 2/06 --> ordered darbe 266mcg q Tuesday sq while here MBD ckd - CCa in range, phos a bit high. Cont IV vdra, sensipar and auryxia 2 ac tid as binder.       Rob Jessamy Torosyan 07/25/2022, 12:38 PM   Recent Labs  Lab 07/24/22 0642 07/24/22 1558 07/25/22 0054  HGB 6.9*  --  7.5*  ALBUMIN 3.2* 3.2*  --   CALCIUM 8.2* 8.2* 7.9*  PHOS 6.9* 6.8*  --   CREATININE 6.96* 7.20* 4.28*  K 5.7* 5.6* 3.6   Recent Labs  Lab 07/24/22 0156  IRON 93  TIBC 216*  FERRITIN 4,104*   Inpatient medications:  sodium chloride   Intravenous Once   allopurinol  100 mg Oral Daily   Chlorhexidine Gluconate Cloth  6 each Topical Q0600   cinacalcet  30 mg Oral Q T,Th,Sa-HD   darbepoetin (ARANESP)  injection - DIALYSIS  200 mcg Subcutaneous Q Tue-1800   dextrose  12.5 g Intravenous Once   doxercalciferol  8 mcg Intravenous Q T,Th,Sa-HD   ferric citrate  420 mg Oral TID WC   hydroxyurea  500 mg Oral Daily   pantoprazole  40 mg Oral Daily   sodium chloride flush  3 mL Intravenous Q12H    sodium chloride     ceFEPime (MAXIPIME) IV 1 g (07/24/22 2249)   sodium chloride, acetaminophen **OR** acetaminophen, HYDROmorphone (DILAUDID) injection, metoprolol tartrate, oxyCODONE, sodium chloride flush

## 2022-07-25 NOTE — Progress Notes (Signed)
Pt receives out-pt HD at [facility] on TTS. Will assist as needed.   Melven Sartorius Renal Navigator [phone]

## 2022-07-25 NOTE — Progress Notes (Signed)
Physical Therapy Treatment Patient Details Name: Patrick [name] MRN: [id number] DOB: Jun 03, 1956 Today's Date: 07/25/2022   History of Present Illness 67 yo male presents to [facility] on 2/5 with  SOB, back pain, abd distention. CXR shows Cardiomegaly with central vascular congestion, Small right pleural effusion and right lung base atelectasis or infiltrate. 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    PT Comments    Pt greeted seated EOB and agreeable to session with focus on gait and functional transfers for increased activity tolerance and improved balance/postural reactions. Pt able to come to stand form EOB at supervision level with mild instability on rise with pt able to self correct with no overt LOB. Pt demonstrating gait in hallway for >300' with no AD at min guard level for safety with pt demonstrating fair negotiation of obstacles despite vision deficits. Pt HR variable throughout session, 122bpm at rest, up to 155bpm briefly/non-sustained with activity recovering quickly with seated rest at end of session. Session truncated as pt receiving phone call. Current plan remains appropriate to address deficits and maximize functional independence and decrease caregiver burden. Pt continues to benefit from skilled PT services to progress toward functional mobility goals.      Recommendations for follow up therapy are one component of a multi-disciplinary discharge planning process, led by the attending physician.  Recommendations may be updated based on patient status, additional functional criteria and insurance authorization.  Follow Up Recommendations  Home health PT     Assistance Recommended at Discharge Set up Supervision/Assistance  Patient can return home with the following A little help with walking and/or transfers;A little help with bathing/dressing/bathroom   Equipment  Recommendations  None recommended by PT    Recommendations for Other Services       Precautions / Restrictions Precautions Precautions: Fall Precaution Comments: fall last week at work because he tripped over object Restrictions Weight Bearing Restrictions: No     Mobility  Bed Mobility Overal bed mobility: Needs Assistance             General bed mobility comments: pt seated EOB pre and post session    Transfers Overall transfer level: Needs assistance Equipment used: None Transfers: Sit to/from Stand, Bed to chair/wheelchair/BSC Sit to Stand: Supervision           General transfer comment: supervision for safety, slight instability note don rise with pt able to self correct    Ambulation/Gait Ambulation/Gait assistance: Min guard Gait Distance (Feet): 350 Feet Assistive device: None Gait Pattern/deviations: Step-through pattern, Trunk flexed Gait velocity: slightly decr     General Gait Details: slightly flexed thrunk throughout with gaze downward 2/2 visual deficits, good hallway/obstacle negotiation despite visual deficits, HR up to 155bpm non sustained   Stairs             Wheelchair Mobility    Modified Rankin (Stroke Patients Only)       Balance Overall balance assessment: Mild deficits observed, not formally tested, History of Falls                                          Cognition Arousal/Alertness: Awake/alert Behavior During Therapy: Flat affect Overall Cognitive Status: Within Functional Limits for tasks assessed  Exercises      General Comments General comments (skin integrity, edema, etc.): HR 122bpm at res tup to 155bpm max with activity, non sustained, recovering quickly with slowing ambualtion, standing rest and coming to sit at end of sesion, session truncated as pt with phone call at end of session      Pertinent Vitals/Pain Pain  Assessment Pain Assessment: Faces Faces Pain Scale: Hurts a little bit Pain Location: generalized Pain Descriptors / Indicators: Discomfort Pain Intervention(s): Monitored during session, Limited activity within patient's tolerance    Home Living                          Prior Function            PT Goals (current goals can now be found in the care plan section) Acute Rehab PT Goals Patient Stated Goal: home PT Goal Formulation: With patient Time For Goal Achievement: 08/07/22 Progress towards PT goals: Progressing toward goals    Frequency    Min 3X/week      PT Plan      Co-evaluation              AM-PAC PT "6 Clicks" Mobility   Outcome Measure  Help needed turning from your back to your side while in a flat bed without using bedrails?: None Help needed moving from lying on your back to sitting on the side of a flat bed without using bedrails?: None Help needed moving to and from a bed to a chair (including a wheelchair)?: None Help needed standing up from a chair using your arms (e.g., wheelchair or bedside chair)?: None Help needed to walk in hospital room?: A Little Help needed climbing 3-5 steps with a railing? : A Little 6 Click Score: 22    End of Session Equipment Utilized During Treatment: Gait belt Activity Tolerance: Patient tolerated treatment well Patient left: with call bell/phone within reach;in bed (seated EOB) Nurse Communication: Mobility status PT Visit Diagnosis: Unsteadiness on feet (R26.81);Muscle weakness (generalized) (M62.81)     Time: 0912-0930 PT Time Calculation (min) (ACUTE ONLY): 18 min  Charges:  $Gait Training: 8-22 mins                     Dinnis Rog R. PTA Acute Rehabilitation Services Office: [place] 07/25/2022, 9:37 AM

## 2022-07-25 NOTE — Progress Notes (Signed)
Occupational Therapy Treatment Patient Details Name: Patrick [name] MRN: [id number] DOB: 07/01/1955 Today's Date: 07/25/2022   History of present illness 67 yo male presents to [facility] on 2/5 with  SOB, back pain, abd distention. CXR shows Cardiomegaly with central vascular congestion, Small right pleural effusion and right lung base atelectasis or infiltrate. 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   OT comments  Pt progressing towards goals, reports feeling much better having dialysis yesterday. Pt able to complete simulated toilet and tub transfer with supervision, mod I for bed mobility, and supervision for transfers without AD. HR ranging 120-130bpm with mobility. Pt presenting with impairments listed below, will follow acutely. Continue to recommend HHOT at d/c pending progression.   Recommendations for follow up therapy are one component of a multi-disciplinary discharge planning process, led by the attending physician.  Recommendations may be updated based on patient status, additional functional criteria and insurance authorization.    Follow Up Recommendations  Home health OT     Assistance Recommended at Discharge Set up Supervision/Assistance  Patient can return home with the following  A little help with walking and/or transfers;A little help with bathing/dressing/bathroom;Assistance with cooking/housework;Direct supervision/assist for medications management;Direct supervision/assist for financial management;Assist for transportation;Help with stairs or ramp for entrance   Equipment Recommendations  BSC/3in1    Recommendations for Other Services PT consult    Precautions / Restrictions Precautions Precautions: Fall Precaution Comments: fall last week at work because he tripped over object Restrictions Weight Bearing Restrictions: No       Mobility Bed  Mobility Overal bed mobility: Modified Independent             General bed mobility comments: no assist    Transfers Overall transfer level: Needs assistance Equipment used: None Transfers: Sit to/from Stand, Bed to chair/wheelchair/BSC Sit to Stand: Supervision                 Balance Overall balance assessment: Mild deficits observed, not formally tested, History of Falls                                         ADL either performed or assessed with clinical judgement   ADL Overall ADL's : Needs assistance/impaired                         Toilet Transfer: Supervision/safety;Ambulation;Regular Glass blower/designer Details (indicate cue type and reason): simulated     Tub/ Shower Transfer: Tub transfer;Supervision/safety;Ambulation Tub/Shower Transfer Details (indicate cue type and reason): simulated        Extremity/Trunk Assessment Upper Extremity Assessment Upper Extremity Assessment: Overall WFL for tasks assessed   Lower Extremity Assessment Lower Extremity Assessment: Defer to PT evaluation        Vision   Vision Assessment?: No apparent visual deficits Additional Comments: has magnifyer in room   Perception Perception Perception: Not tested   Praxis Praxis Praxis: Not tested    Cognition Arousal/Alertness: Awake/alert Behavior During Therapy: Flat affect Overall Cognitive Status: Within Functional Limits for tasks assessed  Exercises      Shoulder Instructions       General Comments HR 120-130bpm with in-room mobility    Pertinent Vitals/ Pain       Pain Assessment Pain Assessment: Faces Pain Score: 2  Faces Pain Scale: Hurts a little bit Pain Location: generalized Pain Descriptors / Indicators: Discomfort Pain Intervention(s): Limited activity within patient's tolerance, Monitored during session, Repositioned  Home Living                                           Prior Functioning/Environment              Frequency  Min 2X/week        Progress Toward Goals  OT Goals(current goals can now be found in the care plan section)  Progress towards OT goals: Progressing toward goals  Acute Rehab OT Goals Patient Stated Goal: to rest OT Goal Formulation: With patient Time For Goal Achievement: 08/07/22 Potential to Achieve Goals: Good ADL Goals Pt Will Perform Upper Body Dressing: with modified independence;sitting Pt Will Perform Lower Body Dressing: with modified independence;sit to/from stand;sitting/lateral leans Pt Will Transfer to Toilet: with modified independence;ambulating;regular height toilet Pt Will Perform Tub/Shower Transfer: Shower transfer;Tub transfer;with modified independence;ambulating Additional ADL Goal #1: pt will be able to stand x25min for functional task in order to improve acitivity tolerance for ADLs  Plan Discharge plan remains appropriate;Frequency remains appropriate    Co-evaluation                 AM-PAC OT "6 Clicks" Daily Activity     Outcome Measure   Help from another person eating meals?: A Little Help from another person taking care of personal grooming?: A Little Help from another person toileting, which includes using toliet, bedpan, or urinal?: A Little Help from another person bathing (including washing, rinsing, drying)?: A Little Help from another person to put on and taking off regular upper body clothing?: A Little Help from another person to put on and taking off regular lower body clothing?: A Little 6 Click Score: 18    End of Session Equipment Utilized During Treatment: Rolling walker (2 wheels)  OT Visit Diagnosis: Unsteadiness on feet (R26.81);Other abnormalities of gait and mobility (R26.89);Muscle weakness (generalized) (M62.81)   Activity Tolerance Patient limited by pain;Patient limited by fatigue   Patient Left in bed;with  call bell/phone within reach;with bed alarm set   Nurse Communication Mobility status        Time: 3893-7342 OT Time Calculation (min): 10 min  Charges: OT General Charges $OT Visit: 1 Visit OT Treatments $Self Care/Home Management : 8-22 mins  Renaye Rakers, OTD, OTR/L SecureChat Preferred Acute Rehab [phone] - [place] 07/25/2022, 11:02 AM

## 2022-07-25 NOTE — Progress Notes (Addendum)
Rounding Note    Patient Name: Patrick [name] Date of Encounter: 07/25/2022  [place] Cardiologist: None   Subjective   Patient subjectively reports improved breathing today. Though he is tachycardic, denies sensation of palpitations or chest.   Inpatient Medications    Scheduled Meds:  sodium chloride   Intravenous Once   allopurinol  100 mg Oral Daily   Chlorhexidine Gluconate Cloth  6 each Topical Q0600   cinacalcet  30 mg Oral Q T,Th,Sa-HD   darbepoetin (ARANESP) injection - DIALYSIS  200 mcg Subcutaneous Q Tue-1800   dextrose  12.5 g Intravenous Once   doxercalciferol  8 mcg Intravenous Q T,Th,Sa-HD   ferric citrate  420 mg Oral TID WC   hydroxyurea  500 mg Oral Daily   pantoprazole  40 mg Oral Daily   sodium chloride flush  3 mL Intravenous Q12H   Continuous Infusions:  sodium chloride     ceFEPime (MAXIPIME) IV 1 g (07/24/22 2249)   vancomycin 750 mg (07/24/22 1812)   PRN Meds: sodium chloride, acetaminophen **OR** acetaminophen, HYDROmorphone (DILAUDID) injection, oxyCODONE, sodium chloride flush   Vital Signs    Vitals:   07/24/22 1911 07/24/22 1922 07/24/22 2135 07/25/22 0416  BP: 96/72 102/72 117/81 94/69  Pulse: (!) 119 (!) 122 (!) 120 (!) 133  Resp: 13 19 18 18   Temp: 98.1 F (36.7 C)  98 F (36.7 C) 99.1 F (37.3 C)  TempSrc:   Oral Oral  SpO2: 98% 97% 98% 94%  Weight: 67.4 kg   64.7 kg  Height:        Intake/Output Summary (Last 24 hours) at 07/25/2022 0936 Last data filed at 07/24/2022 1911 Gross per 24 hour  Intake 350 ml  Output 5000 ml  Net -4650 ml      07/25/2022    4:16 AM 07/24/2022    7:11 PM 07/24/2022    2:33 PM  Last 3 Weights  Weight (lbs) 142 lb 9.6 oz 148 lb 9.4 oz 156 lb 12 oz  Weight (kg) 64.683 kg 67.4 kg 71.1 kg      Telemetry    Atrial fibrillation with RVR, ventricular rates 120s-140s - Personally Reviewed  ECG    No new tracing - Personally Reviewed  Physical Exam   GEN: No acute distress.    Neck: JVP to mandible Cardiac: irregularly irregular and rapid, systolic murmur at LLSB Respiratory: Clear to auscultation bilaterally. GI: Soft, nontender, non-distended  MS: No edema; No deformity. Neuro:  Nonfocal  Psych: Normal affect   Labs    High Sensitivity Troponin:  No results for input(s): "TROPONINIHS" in the last 720 hours.   Chemistry Recent Labs  Lab 07/23/22 1813 07/24/22 0156 07/24/22 0642 07/24/22 1558 07/25/22 0054  NA 139  --  131* 129* 134*  K 3.9  --  5.7* 5.6* 3.6  CL 98  --  89* 89* 94*  CO2 13*  --  18* 17* 23  GLUCOSE 34*  --  95 83 96  BUN 38*  --  53* 58* 29*  CREATININE 5.82*  --  6.96* 7.20* 4.28*  CALCIUM 7.4*  --  8.2* 8.2* 7.9*  MG  --  2.1 2.1  --   --   PROT 6.6  --  7.4  --   --   ALBUMIN 2.9*  --  3.2* 3.2*  --   AST 40  --  47*  --   --   ALT 18  --  27  --   --  ALKPHOS 155*  --  158*  --   --   BILITOT 2.9*  --  3.3*  --   --   GFRNONAA 10*  --  8* 8* 15*  ANIONGAP 28*  --  24* 23* 17*    Lipids No results for input(s): "CHOL", "TRIG", "HDL", "LABVLDL", "LDLCALC", "CHOLHDL" in the last 168 hours.  Hematology Recent Labs  Lab 07/23/22 1813 07/24/22 0156 07/24/22 0642 07/25/22 0054  WBC 9.7  --  8.9 7.5  RBC 1.70* 1.75* 1.75* 1.97*  HGB 6.9*  --  6.9* 7.5*  HCT 18.7*  --  18.7* 20.2*  MCV 110.0*  --  106.9* 102.5*  MCH 40.6*  --  39.4* 38.1*  MCHC 36.9*  --  36.9* 37.1*  RDW 18.2*  --  17.3* 19.9*  PLT 118*  --  131* 136*   Thyroid  Recent Labs  Lab 07/24/22 0156  TSH 7.919*    BNPNo results for input(s): "BNP", "PROBNP" in the last 168 hours.  DDimer No results for input(s): "DDIMER" in the last 168 hours.   Radiology    DG Chest 2 View  Result Date: 07/23/2022 CLINICAL DATA:  Shortness of breath. EXAM: CHEST - 2 VIEW COMPARISON:  Chest radiograph dated 06/07/2022. FINDINGS: Cardiomegaly with central vascular congestion. Small right pleural effusion and right lung base atelectasis or infiltrate, new since the  prior radiograph. No pneumothorax. No acute osseous pathology. IMPRESSION: 1. Cardiomegaly with central vascular congestion. 2. Small right pleural effusion and right lung base atelectasis or infiltrate. Follow-up recommended. Electronically Signed   By: Anner Crete M.D.   On: 07/23/2022 17:13    Cardiac Studies   Echocardiogram 06/08/22 1. Left ventricular ejection fraction, by estimation, is 35 to 40%. The  left ventricle has moderately decreased function. The left ventricle  demonstrates global hypokinesis. There is moderate concentric left  ventricular hypertrophy. Left ventricular  diastolic parameters are indeterminate.   2. Right ventricular systolic function is mildly reduced. The right  ventricular size is moderately enlarged. There is severely elevated  pulmonary artery systolic pressure.   3. Left atrial size was severely dilated.   4. Right atrial size was severely dilated.   5. The mitral valve is degenerative. Moderate mitral valve regurgitation.   6. Tricuspid valve regurgitation is moderate to severe.   7. Aortic valve regurgitation is not visualized. Aortic valve  sclerosis/calcification is present, without any evidence of aortic  stenosis.   8. There is mild dilatation of the aortic root, measuring 38 mm.   9. The inferior vena cava is dilated in size with <50% respiratory  variability, suggesting right atrial pressure of 15 mmHg.   Patient Profile     Patrick [name] is a 67 y.o. male with a hx of ESRD on HD, sickle cell anemia, paroxysmal atrial fibrillation not on anticoagulation, history of GI bleed, chronic systolic heart failure, HTN, COPD, history of SBO s/p small bowel resection, gout, chronic pain, legal blindness who is being seen 07/24/2022 for the evaluation of atrial fibrillation at the request of Dr. Lupita Leash.    Assessment & Plan    Permanent atrial fibrillation  Per chart review, patient not on AV nodal blocking agents at home. Also not on [place] due to  hx chronic anemia, GI bleed. Admitted for hypervolemia, 15lb weight gain.   Rates continue to fluctuate this admission. Initially improved following dialysis yesterday, but this morning rates are back up into the 120s-140s. Patient is fortunately without symptoms. BP remains  low-normal. Rate control options are severely limited by ESRD/CHF. Will plan for PRN Lopressor while admitted. If low BP prohibits use of Metoprolol, amiodarone would be last resort rate/rhythm control strategy. While not ideal given no anticoagulation, patient unlikely to chemically cardiovert given persistent afib.   Acute on chronic systolic CHF Moderate to severe TR Moderate MR  Patient with LVEF 35-40%, moderate-severe TR, moderate MR on 06/08/22 TTE. Admitted with ~15lb weight gain. Unfortunately is not a candidate for valvular repair. Volume status improved today following diuresis. Continue management with HD per nephrology. HF GDMT limited by ESRD and low-normal BP.   Per primary team:  Community acquired pneumonia COPD Sickle cell anemia      For questions or updates, please contact [place] Please consult [email or web address] for contact info under        Signed, Lily Kocher, PA-C  07/25/2022, 9:36 AM    Patient seen and examined with Lily Kocher PA-C.  Agree as above, with the following exceptions and changes as noted below. Feeling well overall with no pain. Gen: NAD, CV: RRR, no murmurs, Lungs: clear, Abd: soft, Extrem: Warm, well perfused, no edema, Neuro/Psych: alert and oriented x 3, normal mood and affect. All available labs, radiology testing, previous records reviewed. Rate variable but asymptomatic and hemodynamically overal lstable, with mild hypotension this am. Consider PRN metoprolol in hospital. Previously on BB but stopped by AHF team, likely secondary to hypotension.   Elouise Munroe, MD 07/25/22 10:33 AM

## 2022-07-26 ENCOUNTER — Other Ambulatory Visit (HOSPITAL_COMMUNITY): Payer: Self-pay

## 2022-07-26 ENCOUNTER — Encounter (HOSPITAL_COMMUNITY): Payer: Self-pay | Admitting: *Deleted

## 2022-07-26 DIAGNOSIS — I4891 Unspecified atrial fibrillation: Secondary | ICD-10-CM | POA: Diagnosis not present

## 2022-07-26 LAB — CBC
HCT: 19.3 % — ABNORMAL LOW (ref 39.0–52.0)
Hemoglobin: 7.2 g/dL — ABNORMAL LOW (ref 13.0–17.0)
MCH: 37.9 pg — ABNORMAL HIGH (ref 26.0–34.0)
MCHC: 37.3 g/dL — ABNORMAL HIGH (ref 30.0–36.0)
MCV: 101.6 fL — ABNORMAL HIGH (ref 80.0–100.0)
Platelets: 144 10*3/uL — ABNORMAL LOW (ref 150–400)
RBC: 1.9 MIL/uL — ABNORMAL LOW (ref 4.22–5.81)
RDW: 20.1 % — ABNORMAL HIGH (ref 11.5–15.5)
WBC: 6.4 10*3/uL (ref 4.0–10.5)
nRBC: 17.7 % — ABNORMAL HIGH (ref 0.0–0.2)

## 2022-07-26 LAB — BASIC METABOLIC PANEL
Anion gap: 16 — ABNORMAL HIGH (ref 5–15)
BUN: 48 mg/dL — ABNORMAL HIGH (ref 8–23)
CO2: 23 mmol/L (ref 22–32)
Calcium: 8 mg/dL — ABNORMAL LOW (ref 8.9–10.3)
Chloride: 92 mmol/L — ABNORMAL LOW (ref 98–111)
Creatinine, Ser: 6.22 mg/dL — ABNORMAL HIGH (ref 0.61–1.24)
GFR, Estimated: 9 mL/min — ABNORMAL LOW (ref >60)
Glucose, Bld: 99 mg/dL (ref 70–99)
Potassium: 3.8 mmol/L (ref 3.5–5.1)
Sodium: 131 mmol/L — ABNORMAL LOW (ref 135–145)

## 2022-07-26 MED ORDER — TORSEMIDE 40 MG PO TABS: 40.0000 mg | ORAL_TABLET | Freq: Two times a day (BID) | ORAL | Status: DC

## 2022-07-26 MED ORDER — AMOXICILLIN-POT CLAVULANATE 250-125 MG PO TABS
1.0000 | ORAL_TABLET | Freq: Two times a day (BID) | ORAL | 0 refills | Status: AC
  Filled 2022-07-26: qty 6, 3d supply, fill #0

## 2022-07-26 MED ORDER — AMOXICILLIN-POT CLAVULANATE 250-125 MG PO TABS
1.0000 | ORAL_TABLET | Freq: Two times a day (BID) | ORAL | Status: DC
  Administered 2022-07-26 – 2022-07-27 (×3): 1 via ORAL
  Filled 2022-07-26 (×3): qty 1

## 2022-07-26 NOTE — Progress Notes (Signed)
Mobility Specialist - Progress Note   07/26/22 0900  Mobility  Activity Ambulated with assistance in hallway  Level of Assistance Contact guard assist, steadying assist  Assistive Device None  Distance Ambulated (ft) 300 ft  Activity Response Tolerated well  Mobility Referral Yes  $Mobility charge 1 Mobility    Pt received in bed agreeable to mobility. No complaints throughout. Left siting EOB w/ call bell in reach and all needs met.  [place] Specialist Please contact via SecureChat or Rehab office at [phone]

## 2022-07-26 NOTE — Progress Notes (Signed)
[facility] Progress Note  Subjective: seen in room, going for HD 2nd shift. No c/o's.   Vitals:   07/25/22 1516 07/25/22 1926 07/25/22 2346 07/26/22 0403  BP: 91/66 97/70 103/77 104/64  Pulse: 93 92 (!) 106 99  Resp: 20 20 19 18   Temp: 98.6 F (37 C) 97.7 F (36.5 C) 98.2 F (36.8 C) 98.1 F (36.7 C)  TempSrc: Oral Oral Oral Oral  SpO2: 94% 94% 93% 94%  Weight:    65.9 kg  Height:        Exam: Gen alert, no distress No rash, cyanosis or gangrene Sclera anicteric, throat clear  Chest clear bilat to bases RRR no MRG Abd soft ntnd no mass or ascites +bs Ext no LE edema Neuro is alert, Ox 3 , nf    RUA AVG  +bruit      OP HD: SW TTS  4h 101min  400/500  60.5kg  2/2 bath  RUA AVF  Hep none  - last HD 2/3 came off at 67kg - sensipar 30mg  po tiw - hectorol 8 mcg IV tiw - mircera 225 mcg IV q2, last 1/23, due 2/6 today     Assessment/ Plan: SOB - was up 10kg on admission, pulled 5 L w/ HD 2/06 and pt improved significantly. Plan UF another 4-5 L w/ HD today.  Sickle cell disease ESRD - on HD TTS. HD today on schedule.  HTN/ volume - as above, improving Anemia esrd - Hb 6-8 is his baseline. Prbc's per primary team. Due for esa on 2/06 --> ordered darbe 268mcg q Tuesday sq while here MBD ckd - CCa in range, phos a bit high. Cont IV vdra, sensipar and auryxia 2 ac tid as binder.  Dispo - ok for dc after HD today from renal standpoint      Rob Gussie Towson 07/26/2022, 12:23 PM   Recent Labs  Lab 07/24/22 0642 07/24/22 1558 07/25/22 0054 07/26/22 0034  HGB 6.9*  --  7.5* 7.2*  ALBUMIN 3.2* 3.2*  --   --   CALCIUM 8.2* 8.2* 7.9* 8.0*  PHOS 6.9* 6.8*  --   --   CREATININE 6.96* 7.20* 4.28* 6.22*  K 5.7* 5.6* 3.6 3.8    Recent Labs  Lab 07/24/22 0156  IRON 93  TIBC 216*  FERRITIN 4,104*    Inpatient medications:  sodium chloride   Intravenous Once   allopurinol  100 mg Oral Daily   amoxicillin-clavulanate  1 tablet Oral Q12H   Chlorhexidine  Gluconate Cloth  6 each Topical Q0600   Chlorhexidine Gluconate Cloth  6 each Topical Q0600   cinacalcet  30 mg Oral Q T,Th,Sa-HD   darbepoetin (ARANESP) injection - DIALYSIS  200 mcg Subcutaneous Q Tue-1800   dextrose  12.5 g Intravenous Once   doxercalciferol  8 mcg Intravenous Q T,Th,Sa-HD   ferric citrate  420 mg Oral TID WC   hydroxyurea  500 mg Oral Daily   pantoprazole  40 mg Oral Daily   sodium chloride flush  3 mL Intravenous Q12H    sodium chloride     sodium chloride, acetaminophen **OR** acetaminophen, HYDROmorphone (DILAUDID) injection, metoprolol tartrate, oxyCODONE, sodium chloride flush

## 2022-07-26 NOTE — Progress Notes (Addendum)
Rounding Note    Patient Name: Patrick [name] Date of Encounter: 07/26/2022  [place] Cardiologist: None   Subjective   Patient reports that he is feeling much better today following dialysis yesterday. Denies chest pain, shortness of breath, palpitations.  Inpatient Medications    Scheduled Meds:  sodium chloride   Intravenous Once   allopurinol  100 mg Oral Daily   Chlorhexidine Gluconate Cloth  6 each Topical Q0600   Chlorhexidine Gluconate Cloth  6 each Topical Q0600   cinacalcet  30 mg Oral Q T,Th,Sa-HD   darbepoetin (ARANESP) injection - DIALYSIS  200 mcg Subcutaneous Q Tue-1800   dextrose  12.5 g Intravenous Once   doxercalciferol  8 mcg Intravenous Q T,Th,Sa-HD   ferric citrate  420 mg Oral TID WC   hydroxyurea  500 mg Oral Daily   pantoprazole  40 mg Oral Daily   sodium chloride flush  3 mL Intravenous Q12H   Continuous Infusions:  sodium chloride     ceFEPime (MAXIPIME) IV 1 g (07/25/22 2045)   PRN Meds: sodium chloride, acetaminophen **OR** acetaminophen, HYDROmorphone (DILAUDID) injection, metoprolol tartrate, oxyCODONE, sodium chloride flush   Vital Signs    Vitals:   07/25/22 1516 07/25/22 1926 07/25/22 2346 07/26/22 0403  BP: 91/66 97/70 103/77 104/64  Pulse: 93 92 (!) 106 99  Resp: 20 20 19 18   Temp: 98.6 F (37 C) 97.7 F (36.5 C) 98.2 F (36.8 C) 98.1 F (36.7 C)  TempSrc: Oral Oral Oral Oral  SpO2: 94% 94% 93% 94%  Weight:    65.9 kg  Height:        Intake/Output Summary (Last 24 hours) at 07/26/2022 0854 Last data filed at 07/26/2022 0500 Gross per 24 hour  Intake 300 ml  Output 500 ml  Net -200 ml      07/26/2022    4:03 AM 07/25/2022    4:16 AM 07/24/2022    7:11 PM  Last 3 Weights  Weight (lbs) 145 lb 3.2 oz 142 lb 9.6 oz 148 lb 9.4 oz  Weight (kg) 65.862 kg 64.683 kg 67.4 kg      Telemetry    Atrial fibrillation. Ventricular rates generally less than 105, occasional RVR - Personally Reviewed  ECG    No new  tracing - Personally Reviewed  Physical Exam   GEN: No acute distress.   Neck: JVD to mandible Cardiac: irregularly irregular, no rubs, or gallops. Systolic murmur LLSB Respiratory: Clear to auscultation bilaterally. GI: Soft, nontender, non-distended  MS: No edema; No deformity. Neuro:  Nonfocal  Psych: Normal affect   Labs    High Sensitivity Troponin:  No results for input(s): "TROPONINIHS" in the last 720 hours.   Chemistry Recent Labs  Lab 07/23/22 1813 07/24/22 0156 07/24/22 0642 07/24/22 1558 07/25/22 0054 07/26/22 0034  NA 139  --  131* 129* 134* 131*  K 3.9  --  5.7* 5.6* 3.6 3.8  CL 98  --  89* 89* 94* 92*  CO2 13*  --  18* 17* 23 23  GLUCOSE 34*  --  95 83 96 99  BUN 38*  --  53* 58* 29* 48*  CREATININE 5.82*  --  6.96* 7.20* 4.28* 6.22*  CALCIUM 7.4*  --  8.2* 8.2* 7.9* 8.0*  MG  --  2.1 2.1  --   --   --   PROT 6.6  --  7.4  --   --   --   ALBUMIN 2.9*  --  3.2* 3.2*  --   --   AST 40  --  47*  --   --   --   ALT 18  --  27  --   --   --   ALKPHOS 155*  --  158*  --   --   --   BILITOT 2.9*  --  3.3*  --   --   --   GFRNONAA 10*  --  8* 8* 15* 9*  ANIONGAP 28*  --  24* 23* 17* 16*    Lipids No results for input(s): "CHOL", "TRIG", "HDL", "LABVLDL", "LDLCALC", "CHOLHDL" in the last 168 hours.  Hematology Recent Labs  Lab 07/24/22 0642 07/25/22 0054 07/26/22 0034  WBC 8.9 7.5 6.4  RBC 1.75* 1.97* 1.90*  HGB 6.9* 7.5* 7.2*  HCT 18.7* 20.2* 19.3*  MCV 106.9* 102.5* 101.6*  MCH 39.4* 38.1* 37.9*  MCHC 36.9* 37.1* 37.3*  RDW 17.3* 19.9* 20.1*  PLT 131* 136* 144*   Thyroid  Recent Labs  Lab 07/24/22 0156  TSH 7.919*    BNPNo results for input(s): "BNP", "PROBNP" in the last 168 hours.  DDimer No results for input(s): "DDIMER" in the last 168 hours.   Radiology    No results found.  Cardiac Studies   Echocardiogram 06/08/22 1. Left ventricular ejection fraction, by estimation, is 35 to 40%. The  left ventricle has moderately decreased  function. The left ventricle  demonstrates global hypokinesis. There is moderate concentric left  ventricular hypertrophy. Left ventricular  diastolic parameters are indeterminate.   2. Right ventricular systolic function is mildly reduced. The right  ventricular size is moderately enlarged. There is severely elevated  pulmonary artery systolic pressure.   3. Left atrial size was severely dilated.   4. Right atrial size was severely dilated.   5. The mitral valve is degenerative. Moderate mitral valve regurgitation.   6. Tricuspid valve regurgitation is moderate to severe.   7. Aortic valve regurgitation is not visualized. Aortic valve  sclerosis/calcification is present, without any evidence of aortic  stenosis.   8. There is mild dilatation of the aortic root, measuring 38 mm.   9. The inferior vena cava is dilated in size with <50% respiratory  variability, suggesting right atrial pressure of 15 mmHg.   Patient Profile     Patrick [name] is a 67 y.o. male with a hx of ESRD on HD, sickle cell anemia, paroxysmal atrial fibrillation not on anticoagulation, history of GI bleed, chronic systolic heart failure, HTN, COPD, history of SBO s/p small bowel resection, gout, chronic pain, legal blindness who is being seen 07/24/2022 for the evaluation of atrial fibrillation at the request of Dr. Lupita Leash.     Assessment & Plan    Permanent atrial fibrillation   Per chart review, patient not on AV nodal blocking agents at home. Also not on [place] due to hx chronic anemia, GI bleed. Admitted for hypervolemia, 15lb weight gain.    Rates continue to fluctuate this admission. Initially improved following dialysis, but subsequently back up into the 120s-140s. Improved again this morning with PRN use of Lopressor. Patient fortunately without symptoms even with tachycardia. BP remains low-normal. Rate control options are severely limited by ESRD/CHF. Continue PRN Lopressor while admitted. If low BP prohibits  use of Metoprolol, amiodarone would be last resort rate/rhythm control strategy given no anticoagulation of AF. Likely permanent, however. Rate control continues to improve as volume status improves.    Acute on chronic systolic CHF Moderate  to severe TR Moderate MR   Patient with LVEF 35-40%, moderate-severe TR, moderate MR on 06/08/22 TTE. Admitted with ~15lb weight gain. Volume status improving following diuresis. Continue management with HD per nephrology. HF GDMT limited by ESRD and low-normal BP.    Per primary team:   Community acquired pneumonia COPD Sickle cell anemia       For questions or updates, please contact [place] Please consult [email or web address] for contact info under        Signed, Lily Kocher, PA-C  07/26/2022, 8:54 AM    Patient seen and examined with Lily Kocher PA-C.  Agree as above, with the following exceptions and changes as noted below. No CP or SOB. Gen: NAD, CV: iRRR, no murmurs, Lungs: clear, Abd: soft, Extrem: Warm, well perfused, no edema, Neuro/Psych: alert and oriented x 3, normal mood and affect. All available labs, radiology testing, previous records reviewed. Rates have improved after dialysis. Unlikely to need homegoing BB, can consider PRN metoprolol for palpitations/elevated rate while in hospital. Not on [facility] but not a watchman candidate per prior notes, agree. Last AHF note reviewed, limited therapeutic options. Overall, looks improved during hospital stay.   Elouise Munroe, MD 07/26/22 10:10 AM

## 2022-07-26 NOTE — Progress Notes (Signed)
Pt for possible d/c today. Case discussed with attending and nephrology team this am. Contacted [facility] can treat pt today if pt can arrive by 11:40 for 12:10 chair time. Clinic has no availability for tomorrow. Spoke to pt via phone. Discussed the above. Pt states he does not have transportation from hospital to clinic and then home. Pt states even if he gets assistance from hospital with ride to clinic, he does not have a ride to get home. Pt will require inpt HD today for this reason. Update provided to attending and nephrology team. Also update inpt HD unit as well. [facility] aware that pt cannot make appt today. Will assist as needed.  Melven Sartorius Renal Navigator [phone]

## 2022-07-26 NOTE — Progress Notes (Signed)
Received patient in bed to unit.  Alert and oriented.  Informed consent signed and in chart.   TX duration:3.75  Patient tolerated well.  Transported back to the room  Alert, without acute distress.  Hand-off given to patient's nurse.   Access used: AVF Access issues: none  Total UF removed: 4.5L Medication(s) given: Dilaudid, Hectorol Post HD VS: 98.3,115/68,95,17,95% Post HD weight: 64.1kg   [facility]

## 2022-07-26 NOTE — Discharge Summary (Signed)
Physician Discharge Summary  Patrick [name] [id number] DOB: 1955-10-22 DOA: 07/23/2022  PCP: [name], MD  Admit date: 07/23/2022 Discharge date: 07/27/2022 Recommendations for Outpatient Follow-up:  Follow up with PCP in 1 weeks-call for appointment Please obtain BMP/CBC in one week  Discharge Dispo: Home w/ [facility] Discharge Condition: Stable Code Status:   Code Status: Full Code Diet recommendation:  Diet Order             Diet renal with fluid restriction Fluid restriction: Other (see comments); Room service appropriate? Yes; Fluid consistency: Thin  Diet effective now                    Brief/Interim Summary: 67 y.o. 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  He had left dialysis early on Saturday and stopped taking his meds and has gained some weight since and was noticing increased fatigue, shortness of breath. In the ED tachycardic 118 tachypneic 29 BP 133/96, saturating well on room air.  Labs showed creatinine 5.8 low bicarb 13, hemoglobin 6.9 g MCV 110.Chest x-ray with cardiomegaly central vascular congestion and small right pleural effusion right lung base atelectasis or infiltrate Patient was admitted for fluid overload, a.fib w rvr, anemia and Hypoglycemia , possible pneumonia.Seen by nephrology underwent dialysis 2/6> with significant improvement in his symptoms.  He had A-fib with RVR but it does not fairly controlled, unable to use beta-blocker due to soft BP seen by cardiology.  Symptomology improved after blood transfusion as well, pain management addressed with Dilaudid suspecting pain crisis.  Underwent extra fluid removal with dialysis also got antibiotics at this time he has  clinically improved. He is being discharged home in medically stable condition with home health services.    Discharge Diagnoses:  Principal Problem:   Atrial fibrillation with RVR (HCC) Active Problems:   Sickle cell anemia (HCC)   Persistent atrial fibrillation (HCC)   ESRD on HD TTS   Chronic systolic CHF (congestive heart failure) (HCC)   Essential hypertension   Prolonged QT interval   Gout   COPD (chronic obstructive pulmonary disease) (HCC)   Chronic pain syndrome   CAP (community acquired pneumonia)   Hypoglycemia  Fluid overload presenting with dyspnea exertion/shortness of breath: Patient symptomatic with fluid overload - was up 10kg on admission>also  with anemia A-fib RVR ESRD deconditioning w/ comorbidities.  S/p HD pulled our 5l  2/6, and another 4-5 liter UF  2/8- Clinically improved no shortness of breath fatigue dyspnea.  He is medically stable for discharge home today.  Follow-up with PCP to check CBC continue HD as per schedule.PT OT advised home health PT OT and will be arranged upon discharge.    Acute on chronic systolic CHF with EF [id number] Moderate-severe TR/moderate MR: Seen by cardiolog-GDMT limited due to hypotensiondialysis-fluid is being addressed with dialysis continue outpatient follow-up continue increasing home torsemide   Community-acquired pneumonia:Blood culture no growth so far,RVP negative, MRSA swab negative.Pro-Cal is elevated lactic acidosis 5.7> down to 3.1- hco now resolved.  off vancomycin treated with IV cefepime. Currently on room air overall improved> switcdh to Augmentin upon discharge w/ renal dosing as per pharmacy Recent Labs  Lab 07/23/22 1813 07/24/22 0156 07/24/22 0642 07/25/22 0054 07/26/22 0034 07/27/22 0047  WBC  9.7  --  8.9 7.5 6.4 6.3  LATICACIDVEN  --  5.7* 3.1*  --   --   --   PROCALCITON  --   --  10.49  --   --   --     Anemia acute on chronic multifactorial sickle cell disease and ESRD Sickle cell anemia Acute on  chronic pain crisis: Baseline hemoglobin 7.4-9.5, he is symptomatic and s/p 1 unit prbc 2/6.Continue Hydrea. continue ferric citrate.  Resume pain meds home with oral oxycodone. He Will follow-up with the sickle cell team soon monitor hemoglobin closely.   Recent Labs  Lab 07/23/22 1813 07/24/22 0642 07/25/22 0054 07/26/22 0034 07/27/22 0047  HGB 6.9* 6.9* 7.5* 7.2* 7.5*  HCT 18.7* 18.7* 20.2* 19.3* 20.5*    Hypoglycemia: Resolved Recent Labs  Lab 07/24/22 0530 07/24/22 0740 07/24/22 1011 07/24/22 1236 07/24/22 1410  GLUCAP 108* 87 73 73 89    Essential hypertension-BP soft/hypotension chronic ESRD on HD TTS Metabolic acidosis Hyperphosphatemia: Nephro following for HD. Continue Sensipar.Continue to address volume with HD, torsemide as BP allows.Monitor weight/intake output Daily as below Net IO Since Admission: -9,060 mL [07/27/22 1143]  Filed Weights   07/26/22 1456 07/26/22 1925 07/27/22 0525  Weight: 69 kg 64.1 kg 62.2 kg    Permanent A-fib presenting with RVR : This morning having RVR up to 158 but not sustained, previously was on Coreg that was discontinued due to soft BP, cardiology input appreciated advised to continue Lopressor as needed while admitted.  Previously Coreg was discontinued due to soft blood pressure.Not on anticoagulation due to risk of fall and recurrent bleeding  Prolonged Qtc: Monitoring telemetry avoid QT prolonging medication. COPD continue home meds. Gout: continue allopurinol.   Consults: Cardiology, nephrology Subjective: Alert awake oriented pain controlled resting comfortably he feels ready for discharge today  Discharge Exam: Vitals:   07/26/22 2316 07/27/22 0525  BP: 104/79 97/69  Pulse: 93   Resp:  20  Temp: 97.9 F (36.6 C) 98.1 F (36.7 C)  SpO2: 93%    General: Pt is alert, awake, not in acute distress Cardiovascular: RRR, S1/S2 +, no rubs, no gallops Respiratory: CTA bilaterally, no wheezing, no rhonchi Abdominal: Soft,  NT, ND, bowel sounds + Extremities: no edema, no cyanosis  Discharge Instructions  Discharge Instructions     Discharge instructions   Complete by: As directed    Cbekc cb in 7 days  Please call call MD or return to ER for similar or worsening recurring problem that brought you to hospital or if any fever,nausea/vomiting,abdominal pain, uncontrolled pain, chest pain,  shortness of breath or any other alarming symptoms.  Please follow-up your doctor as instructed in a week time and call the office for appointment.  Please avoid alcohol, smoking, or any other illicit substance and maintain healthy habits including taking your regular medications as prescribed.  You were cared for by a hospitalist during your hospital stay. If you have any questions about your discharge medications or the care you received while you were in the hospital after you are discharged, you can call the unit and ask to speak with the hospitalist on call if the hospitalist that took care of you is not available.  Once you are discharged, your primary care physician will handle any further medical issues. Please note that NO REFILLS for any discharge medications will be authorized once you are discharged, as it is imperative that you return to your primary care physician (or establish a relationship with a  primary care physician if you do not have one) for your aftercare needs so that they can reassess your need for medications and monitor your lab values   Increase activity slowly   Complete by: As directed       Allergies as of 07/27/2022   No Known Allergies      Medication List     TAKE these medications    acetaminophen 500 MG tablet Commonly known as: TYLENOL Take 1,500 mg by mouth 3 (three) times daily as needed for mild pain, moderate pain, fever or headache.   allopurinol 100 MG tablet Commonly known as: ZYLOPRIM TAKE 1 TABLET BY MOUTH EVERY DAY   amoxicillin-clavulanate 250-125 MG  tablet Commonly known as: AUGMENTIN Take 1 tablet by mouth every 12 (twelve) hours for 3 days.   Auryxia 1 GM 210 MG(Fe) tablet Generic drug: ferric citrate Take 210-420 mg by mouth See admin instructions. 420 mg with every meal, 210 mg with each snack.   cinacalcet 30 MG tablet Commonly known as: SENSIPAR Take 1 tablet (30 mg total) by mouth Every Tuesday,Thursday,and Saturday with dialysis.   diphenhydramine-acetaminophen 25-500 MG Tabs tablet Commonly known as: TYLENOL PM Take 2 tablets by mouth at bedtime as needed (sleep/pain).   folic acid 1 MG tablet Commonly known as: FOLVITE Take 1 tablet (1 mg total) by mouth daily.   GERITOL PO Take 15 mLs by mouth daily.   hydroxyurea 500 MG capsule Commonly known as: HYDREA Take 1 capsule (500 mg total) by mouth daily.   lidocaine-prilocaine cream Commonly known as: EMLA Apply 1 application. topically Every Tuesday,Thursday,and Saturday with dialysis.   Oxycodone HCl 10 MG Tabs Take 1 tablet (10 mg total) by mouth every 6 (six) hours as needed.   pantoprazole 40 MG tablet Commonly known as: PROTONIX Take 1 tablet (40 mg total) by mouth daily.   tiZANidine 4 MG tablet Commonly known as: ZANAFLEX TAKE 1/2 TABLET (2MG TOTAL) BY MOUTH EVERY 8 HOURS AS NEEDED FOR MUSCLE SPASM   Torsemide 40 MG Tabs Take 40 mg by mouth in the morning and at bedtime.   Vitamin D (Ergocalciferol) 1.25 MG (50000 UNIT) Caps capsule Commonly known as: DRISDOL Take 1 capsule (50,000 Units total) by mouth once a week. Monday What changed:  when to take this additional instructions        Follow-up Information     [name], MD Follow up.   Specialty: Internal Medicine Contact information: [place] [address] [place], [facility] Follow up.   Specialty: [place] Why: Agency will contact you to set up apt times Contact information: [address] [place] [address] [phone]         [facility] Follow up.   Specialty: Internal Medicine Why: GO: FEB. 44 AT 1:20PM Contact information: [place] [phone]               No Known Allergies  The results of significant diagnostics from this hospitalization (including imaging, microbiology, ancillary and laboratory) are listed below for reference.    Microbiology: Recent Results (from the past 240 hour(s))  Culture, blood (routine x 2) Call MD if unable to obtain prior to antibiotics being given     Status: None (Preliminary result)   Collection Time: 07/24/22 12:53 AM   Specimen: BLOOD LEFT HAND  Result Value  Ref Range Status   Specimen Description BLOOD LEFT HAND  Final   Special Requests   Final    BOTTLES DRAWN AEROBIC AND ANAEROBIC Blood Culture results may not be optimal due to an excessive volume of blood received in culture bottles   Culture   Final    NO GROWTH 3 DAYS Performed at [facility], [place] [address], [place], [place] [address]    Report Status PENDING  Incomplete  MRSA Next Gen by PCR, Nasal     Status: None   Collection Time: 07/24/22  1:00 PM   Specimen: Nasal Mucosa; Nasal Swab  Result Value Ref Range Status   MRSA by PCR Next Gen NOT DETECTED NOT DETECTED Final    Comment: (NOTE) The GeneXpert MRSA Assay (FDA approved for NASAL specimens only), is one component of a comprehensive MRSA colonization surveillance program. It is not intended to diagnose MRSA infection nor to guide or monitor treatment for MRSA infections. Test performance is not FDA approved in patients less than 47 years old. Performed at [facility], [place] [address] [place], [place] [address]   Respiratory (~20 pathogens) panel by PCR     Status: None   Collection Time: 07/24/22  1:00 PM   Specimen: Nasal Mucosa; Respiratory  Result Value Ref Range Status   Adenovirus NOT DETECTED NOT  DETECTED Final   Coronavirus 229E NOT DETECTED NOT DETECTED Final    Comment: (NOTE) The Coronavirus on the Respiratory Panel, DOES NOT test for the novel  Coronavirus (2019 nCoV)    Coronavirus HKU1 NOT DETECTED NOT DETECTED Final   Coronavirus NL63 NOT DETECTED NOT DETECTED Final   Coronavirus OC43 NOT DETECTED NOT DETECTED Final   Metapneumovirus NOT DETECTED NOT DETECTED Final   Rhinovirus / Enterovirus NOT DETECTED NOT DETECTED Final   Influenza A NOT DETECTED NOT DETECTED Final   Influenza B NOT DETECTED NOT DETECTED Final   Parainfluenza Virus 1 NOT DETECTED NOT DETECTED Final   Parainfluenza Virus 2 NOT DETECTED NOT DETECTED Final   Parainfluenza Virus 3 NOT DETECTED NOT DETECTED Final   Parainfluenza Virus 4 NOT DETECTED NOT DETECTED Final   Respiratory Syncytial Virus NOT DETECTED NOT DETECTED Final   Bordetella pertussis NOT DETECTED NOT DETECTED Final   Bordetella Parapertussis NOT DETECTED NOT DETECTED Final   Chlamydophila pneumoniae NOT DETECTED NOT DETECTED Final   Mycoplasma pneumoniae NOT DETECTED NOT DETECTED Final    Comment: Performed at [facility], [place]. [address], [place], [place] [address]  Expectorated Sputum Assessment w Gram Stain, Rflx to Resp Cult     Status: None   Collection Time: 07/25/22  8:00 AM   Specimen: Expectorated Sputum  Result Value Ref Range Status   Specimen Description EXPECTORATED SPUTUM  Final   Special Requests NONE  Final   Sputum evaluation   Final    Sputum specimen not acceptable for testing.  Please recollect.   Gram Stain Report Called to,Read Back By and Verified With: RN Hale Bogus [phone] @ [phone] [place] Performed at [facility], [place] [address], [place], [place] [address]    Report Status 07/25/2022 FINAL  Final    Procedures/Studies: DG Chest 2 View  Result Date: 07/23/2022 CLINICAL DATA:  Shortness of breath. EXAM: CHEST - 2 VIEW COMPARISON:  Chest radiograph dated 06/07/2022. FINDINGS: Cardiomegaly with central  vascular congestion. Small right pleural effusion and right lung base atelectasis or infiltrate, new since the prior radiograph. No pneumothorax. No acute  osseous pathology. IMPRESSION: 1. Cardiomegaly with central vascular congestion. 2. Small right pleural effusion and right lung base atelectasis or infiltrate. Follow-up recommended. Electronically Signed   By: Anner Crete M.D.   On: 07/23/2022 17:13   IR Paracentesis  Result Date: 07/04/2022 INDICATION: Patient with a history of cirrhosis presents today with ascites. Interventional radiology asked to perform a diagnostic and therapeutic paracentesis. EXAM: ULTRASOUND GUIDED PARACENTESIS MEDICATIONS: None. COMPLICATIONS: None immediate. PROCEDURE: Informed written consent was obtained from the patient after a discussion of the risks, benefits and alternatives to treatment. A timeout was performed prior to the initiation of the procedure. Initial ultrasound scanning demonstrates a large amount of ascites within the left lower abdominal quadrant. The right lower abdomen was prepped and draped in the usual sterile fashion. 1% lidocaine was used for local anesthesia. Following this, a 19 gauge, 7-cm, Yueh catheter was introduced. An ultrasound image was saved for documentation purposes. The paracentesis was performed. The catheter was removed and a dressing was applied. The patient tolerated the procedure well without immediate post procedural complication. FINDINGS: A total of approximately 700 mL of light brown fluid was removed. Samples were sent to the laboratory as requested by the clinical team. IMPRESSION: Successful ultrasound-guided paracentesis yielding 700 mL of peritoneal fluid. Read by: Soyla Dryer, NP Electronically Signed   By: Ruthann Cancer M.D.   On: 07/04/2022 12:14   CT ABDOMEN PELVIS W CONTRAST  Result Date: 07/04/2022 CLINICAL DATA:  Pancreatic cyst." Bile leak". Ascites. Sickle cell anemia. Chronic kidney disease. Heart failure.  EXAM: CT ABDOMEN AND PELVIS WITH CONTRAST TECHNIQUE: Multidetector CT imaging of the abdomen and pelvis was performed using the standard protocol following bolus administration of intravenous contrast. RADIATION DOSE REDUCTION: This exam was performed according to the departmental dose-optimization program which includes automated exposure control, adjustment of the mA and/or kV according to patient size and/or use of iterative reconstruction technique. CONTRAST:  72m OMNIPAQUE IOHEXOL 300 MG/ML  SOLN COMPARISON:  06/04/2022 FINDINGS: Lower chest: Left base subsegmental atelectasis. Moderate cardiomegaly, without pericardial or pleural effusion. Decreased trace right pleural fluid and resolved left pleural effusion. Hepatobiliary: Advanced cirrhosis, as evidenced by an irregular hepatic capsule and caudate/lateral segment left liver lobe enlargement. No focal liver lesion. Cholecystectomy, without biliary ductal dilatation. Pneumobilia. Pancreas: Pancreatic atrophy is moderate. No duct dilatation or acute inflammation. Spleen: Calcified, atrophic spleen likely related to sickle cell. Adrenals/Urinary Tract: Normal adrenal glands. Moderate bilateral renal cortical thinning. Left larger than right renal lesions of up to 9 mm are technically too small to characterize but most likely cysts . In the absence of clinically indicated signs/symptoms require(s) no independent follow-up. No hydronephrosis. Normal urinary bladder. Stomach/Bowel: Normal stomach, without wall thickening. Colonic stool burden suggests constipation. Normal terminal ileum and appendix. Surgical sutures in the region of the ileocecal junction. Normal small bowel. Vascular/Lymphatic: Aortic atherosclerosis. No abdominopelvic adenopathy. Reproductive: Normal prostate. Other: Similar moderate volume abdominopelvic ascites. Is significantly greater in the left upper quadrant and lesser sac, consistent with a component of loculation. Trace fluid tracks  along the posterior left hepatic capsule on 22/2, similar. No free intraperitoneal air. Musculoskeletal: Heterogeneous increased marrow density could represent renal osteodystrophy and/or the sequelae of sickle cell. IMPRESSION: 1. Similar moderate volume abdominopelvic fluid, partially loculated within the left upper quadrant and lesser sac. 2. Cirrhosis without focal liver lesion. 3. Decreased trace right and resolved left pleural effusion. 4.  Possible constipation. 5.  Aortic Atherosclerosis (ICD10-I70.0). Electronically Signed   By: KAdria DevonD.  On: 07/04/2022 11:13    Labs: BNP (last 3 results) Recent Labs    03/31/22 1427  BNP [id number]*   Basic Metabolic Panel: Recent Labs  Lab 07/24/22 0156 07/24/22 0642 07/24/22 1558 07/25/22 0054 07/26/22 0034 07/27/22 0047  NA  --  131* 129* 134* 131* 132*  K  --  5.7* 5.6* 3.6 3.8 3.2*  CL  --  89* 89* 94* 92* 91*  CO2  --  18* 17* 23 23 31  $ GLUCOSE  --  95 83 96 99 124*  BUN  --  53* 58* 29* 48* 26*  CREATININE  --  6.96* 7.20* 4.28* 6.22* 4.05*  CALCIUM  --  8.2* 8.2* 7.9* 8.0* 7.9*  MG 2.1 2.1  --   --   --   --   PHOS 6.8* 6.9* 6.8*  --   --   --    Liver Function Tests: Recent Labs  Lab 07/23/22 1813 07/24/22 0642 07/24/22 1558  AST 40 47*  --   ALT 18 27  --   ALKPHOS 155* 158*  --   BILITOT 2.9* 3.3*  --   PROT 6.6 7.4  --   ALBUMIN 2.9* 3.2* 3.2*   No results for input(s): "LIPASE", "AMYLASE" in the last 168 hours. No results for input(s): "AMMONIA" in the last 168 hours. CBC: Recent Labs  Lab 07/23/22 1813 07/24/22 0642 07/25/22 0054 07/26/22 0034 07/27/22 0047  WBC 9.7 8.9 7.5 6.4 6.3  NEUTROABS 7.5  --   --   --   --   HGB 6.9* 6.9* 7.5* 7.2* 7.5*  HCT 18.7* 18.7* 20.2* 19.3* 20.5*  MCV 110.0* 106.9* 102.5* 101.6* 104.6*  PLT 118* 131* 136* 144* 144*   Cardiac Enzymes: Recent Labs  Lab 07/24/22 0156  CKTOTAL 110   BNP: Invalid input(s): "POCBNP" CBG: Recent Labs  Lab 07/24/22 0530  07/24/22 0740 07/24/22 1011 07/24/22 1236 07/24/22 1410  GLUCAP 108* 87 73 73 89   D-Dimer No results for input(s): "DDIMER" in the last 72 hours. Hgb A1c No results for input(s): "HGBA1C" in the last 72 hours. Lipid Profile No results for input(s): "CHOL", "HDL", "LDLCALC", "TRIG", "CHOLHDL", "LDLDIRECT" in the last 72 hours. Thyroid function studies No results for input(s): "TSH", "T4TOTAL", "T3FREE", "THYROIDAB" in the last 72 hours.  Invalid input(s): "FREET3"  Anemia work up No results for input(s): "VITAMINB12", "FOLATE", "FERRITIN", "TIBC", "IRON", "RETICCTPCT" in the last 72 hours.  Urinalysis    Component Value Date/Time   COLORURINE STRAW (A) 06/02/2018 1900   APPEARANCEUR CLEAR 06/02/2018 1900   APPEARANCEUR Clear 05/31/2017 0902   LABSPEC 1.006 06/02/2018 1900   PHURINE 7.0 06/02/2018 1900   GLUCOSEU NEGATIVE 06/02/2018 1900   HGBUR SMALL (A) 06/02/2018 1900   BILIRUBINUR neg 01/26/2020 1124   BILIRUBINUR Negative 05/31/2017 0902   KETONESUR negative 09/08/2018 1621   KETONESUR NEGATIVE 06/02/2018 1900   PROTEINUR Positive (A) 01/26/2020 1124   PROTEINUR 30 (A) 06/02/2018 1900   UROBILINOGEN 1.0 01/26/2020 1124   UROBILINOGEN 0.2 09/14/2013 1354   NITRITE neg 01/26/2020 1124   NITRITE NEGATIVE 06/02/2018 1900   LEUKOCYTESUR Negative 01/26/2020 1124   LEUKOCYTESUR Negative 05/31/2017 0902   Sepsis Labs Recent Labs  Lab 07/24/22 0642 07/25/22 0054 07/26/22 0034 07/27/22 0047  WBC 8.9 7.5 6.4 6.3   Microbiology Recent Results (from the past 240 hour(s))  Culture, blood (routine x 2) Call MD if unable to obtain prior to antibiotics being given     Status: None (Preliminary  result)   Collection Time: 07/24/22 12:53 AM   Specimen: BLOOD LEFT HAND  Result Value Ref Range Status   Specimen Description BLOOD LEFT HAND  Final   Special Requests   Final    BOTTLES DRAWN AEROBIC AND ANAEROBIC Blood Culture results may not be optimal due to an excessive  volume of blood received in culture bottles   Culture   Final    NO GROWTH 3 DAYS Performed at [facility], [place] [address], [place], [place] [address]    Report Status PENDING  Incomplete  MRSA Next Gen by PCR, Nasal     Status: None   Collection Time: 07/24/22  1:00 PM   Specimen: Nasal Mucosa; Nasal Swab  Result Value Ref Range Status   MRSA by PCR Next Gen NOT DETECTED NOT DETECTED Final    Comment: (NOTE) The GeneXpert MRSA Assay (FDA approved for NASAL specimens only), is one component of a comprehensive MRSA colonization surveillance program. It is not intended to diagnose MRSA infection nor to guide or monitor treatment for MRSA infections. Test performance is not FDA approved in patients less than 79 years old. Performed at [facility], [place] [address], [place], [place] [address]   Respiratory (~20 pathogens) panel by PCR     Status: None   Collection Time: 07/24/22  1:00 PM   Specimen: Nasal Mucosa; Respiratory  Result Value Ref Range Status   Adenovirus NOT DETECTED NOT DETECTED Final   Coronavirus 229E NOT DETECTED NOT DETECTED Final    Comment: (NOTE) The Coronavirus on the Respiratory Panel, DOES NOT test for the novel  Coronavirus (2019 nCoV)    Coronavirus HKU1 NOT DETECTED NOT DETECTED Final   Coronavirus NL63 NOT DETECTED NOT DETECTED Final   Coronavirus OC43 NOT DETECTED NOT DETECTED Final   Metapneumovirus NOT DETECTED NOT DETECTED Final   Rhinovirus / Enterovirus NOT DETECTED NOT DETECTED Final   Influenza A NOT DETECTED NOT DETECTED Final   Influenza B NOT DETECTED NOT DETECTED Final   Parainfluenza Virus 1 NOT DETECTED NOT DETECTED Final   Parainfluenza Virus 2 NOT DETECTED NOT DETECTED Final   Parainfluenza Virus 3 NOT DETECTED NOT DETECTED Final   Parainfluenza Virus 4 NOT DETECTED NOT DETECTED Final   Respiratory Syncytial Virus NOT DETECTED NOT DETECTED Final   Bordetella pertussis NOT DETECTED NOT DETECTED Final   Bordetella  Parapertussis NOT DETECTED NOT DETECTED Final   Chlamydophila pneumoniae NOT DETECTED NOT DETECTED Final   Mycoplasma pneumoniae NOT DETECTED NOT DETECTED Final    Comment: Performed at [facility], [place]. [address] [place], [place] [address]  Expectorated Sputum Assessment w Gram Stain, Rflx to Resp Cult     Status: None   Collection Time: 07/25/22  8:00 AM   Specimen: Expectorated Sputum  Result Value Ref Range Status   Specimen Description EXPECTORATED SPUTUM  Final   Special Requests NONE  Final   Sputum evaluation   Final    Sputum specimen not acceptable for testing.  Please recollect.   Gram Stain Report Called to,Read Back By and Verified With: RN Hale Bogus [phone] @ [phone] [place] Performed at [facility], [place] [address], [place],  [address]    Report Status 07/25/2022 FINAL  Final    Time coordinating discharge: 25 minutes  SIGNED: Antonieta Pert, MD  [facility] 07/27/2022, 11:43 AM  If 7PM-7AM, please contact night-coverage [email or web address]

## 2022-07-26 NOTE — Progress Notes (Signed)
PROGRESS NOTE Patrick [name]  PJK:[id number] DOB: 1955-11-28 DOA: 07/23/2022 PCP: [name], MD   Brief Narrative/Hospital Course: 67 y.o. 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  He had left dialysis early on Saturday and stopped taking his meds and has gained some weight since and was noticing increased fatigue, shortness of breath. In the ED tachycardic 118 tachypneic 29 BP 133/96, saturating well on room air.  Labs showed creatinine 5.8 low bicarb 13, hemoglobin 6.9 g MCV 110.Chest x-ray with cardiomegaly central vascular congestion and small right pleural effusion right lung base atelectasis or infiltrate Patient was admitted for fluid overload, a.fib w rvr, anemia and Hypoglycemia , possible pneumonia.Seen by nephrology underwent dialysis 2/6> with significant improvement in his symptoms.  He had A-fib with RVR but it does not fairly controlled, unable to use beta-blocker due to soft BP seen by cardiology.  Symptomology improved after blood transfusion as well, pain management addressed with Dilaudid suspecting pain crisis.  At this time he remains medically stable on room air back to baseline, will be discharged after his scheduled dialysis, unable to go for outpatient dialysis today.    Subjective: Seen and examined this morning resting comfortably feels much better alert awake oriented on room air.  Awaiting for dialysis today   Assessment and Plan: Principal Problem:   Atrial fibrillation with RVR (HCC) Active Problems:   Sickle cell anemia (HCC)   Persistent atrial fibrillation (HCC)   ESRD on HD TTS   Chronic systolic CHF (congestive heart failure) (HCC)   Essential hypertension   Prolonged QT  interval   Gout   COPD (chronic obstructive pulmonary disease) (HCC)   Chronic pain syndrome   CAP (community acquired pneumonia)   Hypoglycemia   Fluid overload presenting with dyspnea exertion/shortness of breath: Patient symptomatic with fluid overload - was up 10kg on admission>also  with anemia A-fib RVR ESRD deconditioning w/ comorbidities.  S/p HD pulled our 5l  2/6, and for another 4-5 liter UF planned today>Clinically he feels improved.  He is doing well on room air heart rate is stable hemoglobin holding of 7 g.  PT OT assisted home health PT OT and will be arranged upon discharge.     Acute on chronic systolic CHF with EF 80-99% Moderate-severe TR/moderate MR: Seen by cardiolog-GDMT limited due to hypotension, dialysis-cont toaddress fluid status with dialysis.  Monitor intake output Daily weight, resume torsemide upon discharge.   Community-acquired pneumonia:Blood culture no growth so far,RVP negative, MRSA swab negative.Pro-Cal is elevated lactic acidosis 5.7> down to 3.1- hco now resolved.  off vancomycin treated with IV cefepime. Currently on room air overall improved> switch to Augmentin -renal dosing pharmacy consulted. Recent Labs  Lab 07/23/22 1813 07/24/22 0156 07/24/22 0642 07/25/22 0054 07/26/22 0034  WBC 9.7  --  8.9 7.5 6.4  LATICACIDVEN  --  5.7* 3.1*  --   --   PROCALCITON  --   --  10.49  --   --      Anemia acute on chronic multifactorial sickle cell disease and ESRD Sickle cell anemia Acute on chronic pain crisis: Baseline hemoglobin 7.4-9.5, he is symptomatic and s/p 1 unit prbc 2/6.Continue Hydrea. continue ferric citrate.  Continue pain management with Dilaudid PRN as BP tolerates, on  chronic oxy at home.sickle cell team 07/24/22. Recent Labs  Lab 07/23/22 1813 07/24/22 0642 07/25/22 0054 07/26/22 0034  HGB 6.9* 6.9* 7.5* 7.2*  HCT 18.7* 18.7* 20.2* 19.3*     Hypoglycemia: Resolved Recent Labs  Lab 07/24/22 0530 07/24/22 0740 07/24/22 1011  07/24/22 1236 07/24/22 1410  GLUCAP 108* 87 73 73 89     Essential hypertension-BP soft/hypotension chronic ESRD on HD TTS Metabolic acidosis Hyperphosphatemia: Nephro following for HD>S/p HD pulled our 5l  2/6, and for another 4-5 liter UF planned today Net IO Since Admission: -4,750 mL [07/26/22 1436]  Filed Weights   07/24/22 1911 07/25/22 0416 07/26/22 0403  Weight: 67.4 kg 64.7 kg 65.9 kg    Permanent A-fib presenting with RVR : This morning having RVR up to 158 but not sustained, previously was on Coreg that was discontinued due to soft BP, cardiology input appreciated advised to continue Lopressor as needed while admitted.  Previously Coreg was discontinued due to soft blood pressure.Not on anticoagulation due to risk of fall and recurrent bleeding  Prolonged Qtc: Monitoring telemetry avoid QT prolonging medication. COPD continue home meds. Chronic pain syndrome on oxycodone PRN> reports she at times takes 3 of those 10 mg oxycodone.  Requesting for more pain medication IV fentanyl ineffective.  Switch to IV Dilaudid with holding parameters for low BP Gout: continue allopurinol.  DVT prophylaxis: SCDs Start: 07/24/22 0044 Code Status:   Code Status: Full Code Family Communication: plan of care discussed with patient at bedside. Patient status is: Inpatient because of shortness of breath dyspnea Level of care: Progressive   Dispo: The patient is from: home lives alone.cousins are nearby.            Anticipated disposition: Home tomorrow morning, will be going for dialysis later today  Objective: Vitals last 24 hrs: Vitals:   07/25/22 1516 07/25/22 1926 07/25/22 2346 07/26/22 0403  BP: 91/66 97/70 103/77 104/64  Pulse: 93 92 (!) 106 99  Resp: 20 20 19 18   Temp: 98.6 F (37 C) 97.7 F (36.5 C) 98.2 F (36.8 C) 98.1 F (36.7 C)  TempSrc: Oral Oral Oral Oral  SpO2: 94% 94% 93% 94%  Weight:    65.9 kg  Height:       Weight change: -5.238 kg  Physical  Examination: General exam: AAox3, weak,older appearing HEENT:Oral mucosa moist, Ear/Nose WNL grossly, dentition normal. Respiratory system: bilaterally clear BS, no use of accessory muscle Cardiovascular system: S1 & S2 +, regular rate,. Gastrointestinal system: Abdomen soft, NT,ND,BS+ Nervous System:Alert, awake, moving extremities and grossly nonfocal Extremities: LE ankle edema neg, lower extremities warm Skin: No rashes,no icterus. MSK: Normal muscle bulk,tone, power    Medications reviewed:  Scheduled Meds:  sodium chloride   Intravenous Once   allopurinol  100 mg Oral Daily   amoxicillin-clavulanate  1 tablet Oral Q12H   Chlorhexidine Gluconate Cloth  6 each Topical Q0600   Chlorhexidine Gluconate Cloth  6 each Topical Q0600   cinacalcet  30 mg Oral Q T,Th,Sa-HD   darbepoetin (ARANESP) injection - DIALYSIS  200 mcg Subcutaneous Q Tue-1800   dextrose  12.5 g Intravenous Once   doxercalciferol  8 mcg Intravenous Q T,Th,Sa-HD   ferric citrate  420 mg Oral TID WC   hydroxyurea  500 mg Oral Daily   pantoprazole  40 mg Oral Daily   sodium chloride flush  3 mL Intravenous Q12H   Continuous Infusions:  sodium chloride      Diet Order  Diet renal with fluid restriction Fluid restriction: Other (see comments); Room service appropriate? Yes; Fluid consistency: Thin  Diet effective now                  Intake/Output Summary (Last 24 hours) at 07/26/2022 1436 Last data filed at 07/26/2022 0500 Gross per 24 hour  Intake 300 ml  Output 500 ml  Net -200 ml    Net IO Since Admission: -4,750 mL [07/26/22 1436]  Wt Readings from Last 3 Encounters:  07/26/22 65.9 kg  07/02/22 61.9 kg  06/28/22 64.4 kg     Unresulted Labs (From admission, onward)     Start     Ordered   07/25/22 0347  Basic metabolic panel  Daily,   R     Question:  Specimen collection method  Answer:  IV Team=IV Team collect   07/24/22 0826   07/25/22 0500  CBC  Daily,   R     Question:   Specimen collection method  Answer:  IV Team=IV Team collect   07/24/22 0826   07/23/22 2308  Legionella Pneumophila Serogp 1 Ur Ag  Once,   URGENT        07/23/22 2308   07/23/22 2308  Strep pneumoniae urinary antigen  Once,   URGENT        07/23/22 2308          Data Reviewed: I have personally reviewed following labs and imaging studies CBC: Recent Labs  Lab 07/23/22 1813 07/24/22 0642 07/25/22 0054 07/26/22 0034  WBC 9.7 8.9 7.5 6.4  NEUTROABS 7.5  --   --   --   HGB 6.9* 6.9* 7.5* 7.2*  HCT 18.7* 18.7* 20.2* 19.3*  MCV 110.0* 106.9* 102.5* 101.6*  PLT 118* 131* 136* 144*    Basic Metabolic Panel: Recent Labs  Lab 07/23/22 1813 07/24/22 0156 07/24/22 0642 07/24/22 1558 07/25/22 0054 07/26/22 0034  NA 139  --  131* 129* 134* 131*  K 3.9  --  5.7* 5.6* 3.6 3.8  CL 98  --  89* 89* 94* 92*  CO2 13*  --  18* 17* 23 23  GLUCOSE 34*  --  95 83 96 99  BUN 38*  --  53* 58* 29* 48*  CREATININE 5.82*  --  6.96* 7.20* 4.28* 6.22*  CALCIUM 7.4*  --  8.2* 8.2* 7.9* 8.0*  MG  --  2.1 2.1  --   --   --   PHOS  --  6.8* 6.9* 6.8*  --   --     GFR: Estimated Creatinine Clearance: 11.2 mL/min (A) (by C-G formula based on SCr of 6.22 mg/dL (H)). Liver Function Tests: Recent Labs  Lab 07/23/22 1813 07/24/22 0642 07/24/22 1558  AST 40 47*  --   ALT 18 27  --   ALKPHOS 155* 158*  --   BILITOT 2.9* 3.3*  --   PROT 6.6 7.4  --   ALBUMIN 2.9* 3.2* 3.2*    Recent Labs  Lab 07/24/22 0530 07/24/22 0740 07/24/22 1011 07/24/22 1236 07/24/22 1410  GLUCAP 108* 87 73 73 89    Recent Labs    07/24/22 0156  TSH 7.919*   Sepsis Labs: Recent Labs  Lab 07/24/22 0156 07/24/22 0642  PROCALCITON  --  10.49  LATICACIDVEN 5.7* 3.1*    Recent Results (from the past 240 hour(s))  Culture, blood (routine x 2) Call MD if unable to obtain prior to antibiotics being given  Status: None (Preliminary result)   Collection Time: 07/24/22 12:53 AM   Specimen: BLOOD LEFT  HAND  Result Value Ref Range Status   Specimen Description BLOOD LEFT HAND  Final   Special Requests   Final    BOTTLES DRAWN AEROBIC AND ANAEROBIC Blood Culture results may not be optimal due to an excessive volume of blood received in culture bottles   Culture   Final    NO GROWTH 2 DAYS Performed at [facility], [place] [address] [place], [place] [address]    Report Status PENDING  Incomplete  MRSA Next Gen by PCR, Nasal     Status: None   Collection Time: 07/24/22  1:00 PM   Specimen: Nasal Mucosa; Nasal Swab  Result Value Ref Range Status   MRSA by PCR Next Gen NOT DETECTED NOT DETECTED Final    Comment: (NOTE) The GeneXpert MRSA Assay (FDA approved for NASAL specimens only), is one component of a comprehensive MRSA colonization surveillance program. It is not intended to diagnose MRSA infection nor to guide or monitor treatment for MRSA infections. Test performance is not FDA approved in patients less than 12 years old. Performed at [facility], [place] [address], [place], [place] [address]   Respiratory (~20 pathogens) panel by PCR     Status: None   Collection Time: 07/24/22  1:00 PM   Specimen: Nasal Mucosa; Respiratory  Result Value Ref Range Status   Adenovirus NOT DETECTED NOT DETECTED Final   Coronavirus 229E NOT DETECTED NOT DETECTED Final    Comment: (NOTE) The Coronavirus on the Respiratory Panel, DOES NOT test for the novel  Coronavirus (2019 nCoV)    Coronavirus HKU1 NOT DETECTED NOT DETECTED Final   Coronavirus NL63 NOT DETECTED NOT DETECTED Final   Coronavirus OC43 NOT DETECTED NOT DETECTED Final   Metapneumovirus NOT DETECTED NOT DETECTED Final   Rhinovirus / Enterovirus NOT DETECTED NOT DETECTED Final   Influenza A NOT DETECTED NOT DETECTED Final   Influenza B NOT DETECTED NOT DETECTED Final   Parainfluenza Virus 1 NOT DETECTED NOT DETECTED Final   Parainfluenza Virus 2 NOT DETECTED NOT DETECTED Final   Parainfluenza Virus 3 NOT DETECTED NOT  DETECTED Final   Parainfluenza Virus 4 NOT DETECTED NOT DETECTED Final   Respiratory Syncytial Virus NOT DETECTED NOT DETECTED Final   Bordetella pertussis NOT DETECTED NOT DETECTED Final   Bordetella Parapertussis NOT DETECTED NOT DETECTED Final   Chlamydophila pneumoniae NOT DETECTED NOT DETECTED Final   Mycoplasma pneumoniae NOT DETECTED NOT DETECTED Final    Comment: Performed at [facility], [place] [address], [place], [place] [address]  Expectorated Sputum Assessment w Gram Stain, Rflx to Resp Cult     Status: None   Collection Time: 07/25/22  8:00 AM   Specimen: Expectorated Sputum  Result Value Ref Range Status   Specimen Description EXPECTORATED SPUTUM  Final   Special Requests NONE  Final   Sputum evaluation   Final    Sputum specimen not acceptable for testing.  Please recollect.   Gram Stain Report Called to,Read Back By and Verified With: RN Hale Bogus [phone] @ [phone] [place] Performed at [facility], [place] [address], [place], [place] [address]    Report Status 07/25/2022 FINAL  Final    Antimicrobials: Anti-infectives (From admission, onward)    Start     Dose/Rate Route Frequency Ordered Stop   07/26/22 1100  amoxicillin-clavulanate (AUGMENTIN) 250-125 MG per tablet 1 tablet  1 tablet Oral Every 12 hours 07/26/22 1006     07/26/22 0000  amoxicillin-clavulanate (AUGMENTIN) 250-125 MG tablet        1 tablet Oral Every 12 hours 07/26/22 1012 07/29/22 2359   07/24/22 2100  ceFEPIme (MAXIPIME) 1 g in sodium chloride 0.9 % 100 mL IVPB  Status:  Discontinued        1 g 200 mL/hr over 30 Minutes Intravenous Every 24 hours 07/24/22 0741 07/26/22 1006   07/24/22 1600  vancomycin (VANCOREADY) IVPB 750 mg/150 mL  Status:  Discontinued        750 mg 150 mL/hr over 60 Minutes Intravenous Every T-Th-Sa (Hemodialysis) 07/24/22 1046 07/25/22 0949   07/23/22 2100  vancomycin (VANCOREADY) IVPB 1250 mg/250 mL        1,250 mg 166.7 mL/hr over 90 Minutes Intravenous  Once  07/23/22 2052 07/24/22 0134   07/23/22 2045  ceFEPIme (MAXIPIME) 1 g in sodium chloride 0.9 % 100 mL IVPB        1 g 200 mL/hr over 30 Minutes Intravenous  Once 07/23/22 2040 07/23/22 2140      Culture/Microbiology    Component Value Date/Time   SDES EXPECTORATED SPUTUM 07/25/2022 0800   SPECREQUEST NONE 07/25/2022 0800   CULT  07/24/2022 0053    NO GROWTH 2 DAYS Performed at [facility], [place] [address], [place], [place] [address]    REPTSTATUS 07/25/2022 FINAL 07/25/2022 0800  Radiology Studies: No results found.   LOS: 2 days   Antonieta Pert, MD [facility]  07/26/2022, 2:36 PM

## 2022-07-27 DIAGNOSIS — I4891 Unspecified atrial fibrillation: Secondary | ICD-10-CM | POA: Diagnosis not present

## 2022-07-27 LAB — BASIC METABOLIC PANEL
Anion gap: 10 (ref 5–15)
BUN: 26 mg/dL — ABNORMAL HIGH (ref 8–23)
CO2: 31 mmol/L (ref 22–32)
Calcium: 7.9 mg/dL — ABNORMAL LOW (ref 8.9–10.3)
Chloride: 91 mmol/L — ABNORMAL LOW (ref 98–111)
Creatinine, Ser: 4.05 mg/dL — ABNORMAL HIGH (ref 0.61–1.24)
GFR, Estimated: 16 mL/min — ABNORMAL LOW (ref >60)
Glucose, Bld: 124 mg/dL — ABNORMAL HIGH (ref 70–99)
Potassium: 3.2 mmol/L — ABNORMAL LOW (ref 3.5–5.1)
Sodium: 132 mmol/L — ABNORMAL LOW (ref 135–145)

## 2022-07-27 LAB — CBC
HCT: 20.5 % — ABNORMAL LOW (ref 39.0–52.0)
Hemoglobin: 7.5 g/dL — ABNORMAL LOW (ref 13.0–17.0)
MCH: 38.3 pg — ABNORMAL HIGH (ref 26.0–34.0)
MCHC: 36.6 g/dL — ABNORMAL HIGH (ref 30.0–36.0)
MCV: 104.6 fL — ABNORMAL HIGH (ref 80.0–100.0)
Platelets: 144 10*3/uL — ABNORMAL LOW (ref 150–400)
RBC: 1.96 MIL/uL — ABNORMAL LOW (ref 4.22–5.81)
RDW: 20.5 % — ABNORMAL HIGH (ref 11.5–15.5)
WBC: 6.3 10*3/uL (ref 4.0–10.5)
nRBC: 18.8 % — ABNORMAL HIGH (ref 0.0–0.2)

## 2022-07-27 MED ORDER — POTASSIUM CHLORIDE CRYS ER 10 MEQ PO TBCR
10.0000 meq | EXTENDED_RELEASE_TABLET | Freq: Once | ORAL | Status: AC
  Administered 2022-07-27: 10 meq via ORAL
  Filled 2022-07-27: qty 1

## 2022-07-27 NOTE — Progress Notes (Signed)
Occupational Therapy Treatment Patient Details Name: Patrick [name] MRN: [id number] DOB: 12-21-1955 Today's Date: 07/27/2022   History of present illness 67 yo male [facility]/5 with  SOB, back pain, abd distention. CXR shows Cardiomegaly with central vascular congestion, Small right pleural effusion and right lung base atelectasis or infiltrate. 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   OT comments  Pt. Seen for skilled OT session.  Pt. Able to complete lb dressing seated eob.  Amb. To b.room and sink for simualted toileting and grooming tasks.  Good safety awareness no LOB noted.  Progressing well.  Eager for home when able.  States nephew planning on living with him.     Recommendations for follow up therapy are one component of a multi-disciplinary discharge planning process, led by the attending physician.  Recommendations may be updated based on patient status, additional functional criteria and insurance authorization.    Follow Up Recommendations  Home health OT     Assistance Recommended at Discharge Set up Supervision/Assistance  Patient can return home with the following  A little help with walking and/or transfers;A little help with bathing/dressing/bathroom;Assistance with cooking/housework;Direct supervision/assist for medications management;Direct supervision/assist for financial management;Assist for transportation;Help with stairs or ramp for entrance   Equipment Recommendations  BSC/3in1    Recommendations for Other Services      Precautions / Restrictions Precautions Precautions: Fall Precaution Comments: fall last week at work because he tripped over object Restrictions Weight Bearing Restrictions: No       Mobility Bed Mobility Overal bed mobility: Modified Independent                  Transfers Overall transfer level: Needs  assistance Equipment used: None Transfers: Sit to/from Stand, Bed to chair/wheelchair/BSC Sit to Stand: Supervision     Step pivot transfers: Supervision           Balance                                           ADL either performed or assessed with clinical judgement   ADL Overall ADL's : Needs assistance/impaired     Grooming: Min guard;Standing Grooming Details (indicate cue type and reason): simulated at sink, pt. declined actual completion             Lower Body Dressing: Set up;Sitting/lateral leans Lower Body Dressing Details (indicate cue type and reason): donned socks seated eob Toilet Transfer: Supervision/safety;Ambulation;Regular Glass blower/designer Details (indicate cue type and reason): amb. into the b.room but declined actual use         Functional mobility during ADLs: Min guard General ADL Comments: pt. moving well has necessary magnification tools to aide with impaired vision.    Extremity/Trunk Assessment              Vision       Perception     Praxis      Cognition Arousal/Alertness: Awake/alert Behavior During Therapy: WFL for tasks assessed/performed Overall Cognitive Status: Within Functional Limits for tasks assessed  Exercises      Shoulder Instructions       General Comments      Pertinent Vitals/ Pain       Pain Assessment Pain Assessment: No/denies pain  Home Living                                          Prior Functioning/Environment              Frequency  Min 2X/week        Progress Toward Goals  OT Goals(current goals can now be found in the care plan section)  Progress towards OT goals: Progressing toward goals     Plan Discharge plan remains appropriate;Frequency remains appropriate    Co-evaluation                 AM-PAC OT "6 Clicks" Daily Activity     Outcome Measure    Help from another person eating meals?: A Little Help from another person taking care of personal grooming?: A Little Help from another person toileting, which includes using toliet, bedpan, or urinal?: A Little Help from another person bathing (including washing, rinsing, drying)?: A Little Help from another person to put on and taking off regular upper body clothing?: A Little Help from another person to put on and taking off regular lower body clothing?: A Little 6 Click Score: 18    End of Session    OT Visit Diagnosis: Unsteadiness on feet (R26.81);Other abnormalities of gait and mobility (R26.89);Muscle weakness (generalized) (M62.81)   Activity Tolerance Patient tolerated treatment well   Patient Left in bed;with call bell/phone within reach   Nurse Communication          Time: 0901-0909 OT Time Calculation (min): 8 min  Charges: OT General Charges $OT Visit: 1 Visit OT Treatments $Self Care/Home Management : 8-22 mins  Sonia Baller, COTA/L Acute Rehabilitation [phone]   Clearnce Sorrel Lorraine-COTA/L 07/27/2022, 11:52 AM

## 2022-07-27 NOTE — TOC Transition Note (Signed)
Transition of Care [facility]) - CM/SW Discharge Note   Patient Details  Name: Patrick [name] MRN: [id number] Date of Birth: [birth year]  [facility] CM/SW Contact:  Zenon Mayo, RN Phone Number: 07/27/2022, 10:50 AM   Clinical Narrative:    Patient is for dc today, NCM notified [place] of DC today.  [place] his cousin will transport him home.   Final next level of care: Home w Home Health Services Barriers to Discharge: Continued Medical Work up   Patient Goals and CMS Choice CMS Medicare.gov Compare Post Acute Care list provided to:: Patient Choice offered to / list presented to : Patient  Discharge Placement                         Discharge Plan and Services Additional resources added to the After Visit Summary for   In-house Referral: NA Discharge Planning Services: CM Consult Post Acute Care Choice: Home Health          DME Arranged: N/A DME Agency: NA       HH Arranged: PT, OT HH Agency: [place] Date [facility] Agency Contacted: 07/24/22 Time [place]: [id number] Representative spoke with at [place]: [place] ([place]) Interventions SDOH Screenings   Food Insecurity: No Food Insecurity (07/24/2022)  Housing: Low Risk  (07/24/2022)  Transportation Needs: No Transportation Needs (07/24/2022)  Utilities: Not At Risk (07/24/2022)  Depression (PHQ2-9): Low Risk  (04/17/2022)  Financial Resource Strain: Low Risk  (06/02/2021)  Physical Activity: Sufficiently Active (10/01/2019)  Tobacco Use: Medium Risk (07/23/2022)     Readmission Risk Interventions    07/24/2022    1:28 PM 08/05/2020   11:54 AM  Readmission Risk Prevention Plan  Transportation Screening Complete Complete  PCP or Specialist Appt within 3-5 Days  Complete  HRI or [place]  Complete  Social Work Consult for [place] Planning/Counseling  [place]  Not Applicable  Medication Review Human resources officer) Complete Complete  PCP or Specialist appointment within 3-5 days of discharge Complete   HRI or [place] Complete   [place] Not Applicable

## 2022-07-27 NOTE — Care Management Important Message (Signed)
Important Message  Patient Details  Name: Patrick [name] MRN: [id number] Date of Birth: 1956/05/17   Medicare Important Message Given:  Yes     [name] 07/27/2022, 8:37 AM

## 2022-07-27 NOTE — Progress Notes (Signed)
D/C order noted. Contacted [facility] SW to advise clinic of pt's d/c today and that pt should resume care tomorrow.   Melven Sartorius Renal Navigator [phone]

## 2022-07-27 NOTE — Progress Notes (Signed)
[facility] Progress Note  Subjective: seen in room. Feels much better, 4.5 L off w/ HD yesterday.   Vitals:   07/26/22 1925 07/26/22 2000 07/26/22 2316 07/27/22 0525  BP:  113/63 104/79 97/69  Pulse:  (!) 113 93   Resp:  17  20  Temp:  97.8 F (36.6 C) 97.9 F (36.6 C) 98.1 F (36.7 C)  TempSrc:  Oral  Oral  SpO2:  99% 93%   Weight: 64.1 kg   62.2 kg  Height:        Exam: Gen alert, no distress No rash, cyanosis or gangrene Sclera anicteric, throat clear  Chest clear bilat to bases RRR no MRG Abd soft ntnd no mass or ascites +bs Ext no LE edema Neuro is alert, Ox 3 , nf    RUA AVG  +bruit      OP HD: SW TTS  4h 10mn  400/500  60.5kg  2/2 bath  RUA AVF  Hep none  - last HD 2/3 came off at 67kg - sensipar 3107mpo tiw - hectorol 8 mcg IV tiw - mircera 225 mcg IV q2, last 1/23, due 2/6 today     Assessment/ Plan: SOB - was up 10kg on admission, pulled 5 L w/ HD 2/06 and 4.5 L w/ HD yesterday. Close to dry wt now.  Sickle cell disease ESRD - on HD TTS. Had HD yesterday.  HTN/ volume - improved Anemia esrd - Hb 6-8 is his baseline. Prbc's per primary team. Due for esa on 2/06 --> ordered darbe 2006mq Tuesday sq while here MBD ckd - CCa in range, phos a bit high. Cont IV vdra, sensipar and auryxia 2 ac tid as binder.  Dispo - ok for DC from renal standpoint      Patrick [name] 07/27/2022, 11:10 AM   Recent Labs  Lab 07/24/22 0642 07/24/22 1558 07/25/22 0054 07/26/22 0034 07/27/22 0047  HGB 6.9*  --    < > 7.2* 7.5*  ALBUMIN 3.2* 3.2*  --   --   --   CALCIUM 8.2* 8.2*   < > 8.0* 7.9*  PHOS 6.9* 6.8*  --   --   --   CREATININE 6.96* 7.20*   < > 6.22* 4.05*  K 5.7* 5.6*   < > 3.8 3.2*   < > = values in this interval not displayed.    Recent Labs  Lab 07/24/22 0156  IRON 93  TIBC 216*  FERRITIN 4,104*    Inpatient medications:  sodium chloride   Intravenous Once   allopurinol  100 mg Oral Daily   amoxicillin-clavulanate  1 tablet  Oral Q12H   Chlorhexidine Gluconate Cloth  6 each Topical Q0600   Chlorhexidine Gluconate Cloth  6 each Topical Q0600   cinacalcet  30 mg Oral Q T,Th,Sa-HD   darbepoetin (ARANESP) injection - DIALYSIS  200 mcg Subcutaneous Q Tue-1800   dextrose  12.5 g Intravenous Once   doxercalciferol  8 mcg Intravenous Q T,Th,Sa-HD   ferric citrate  420 mg Oral TID WC   hydroxyurea  500 mg Oral Daily   pantoprazole  40 mg Oral Daily   potassium chloride  10 mEq Oral Once   sodium chloride flush  3 mL Intravenous Q12H    sodium chloride     sodium chloride, acetaminophen **OR** acetaminophen, HYDROmorphone (DILAUDID) injection, metoprolol tartrate, oxyCODONE, sodium chloride flush

## 2022-07-28 DIAGNOSIS — D5700 Hb-SS disease with crisis, unspecified: Secondary | ICD-10-CM | POA: Diagnosis not present

## 2022-07-28 DIAGNOSIS — D57819 Other sickle-cell disorders with crisis, unspecified: Secondary | ICD-10-CM | POA: Diagnosis not present

## 2022-07-28 DIAGNOSIS — D571 Sickle-cell disease without crisis: Secondary | ICD-10-CM | POA: Diagnosis not present

## 2022-07-28 DIAGNOSIS — Z992 Dependence on renal dialysis: Secondary | ICD-10-CM | POA: Diagnosis not present

## 2022-07-28 DIAGNOSIS — D631 Anemia in chronic kidney disease: Secondary | ICD-10-CM | POA: Diagnosis not present

## 2022-07-28 DIAGNOSIS — N2581 Secondary hyperparathyroidism of renal origin: Secondary | ICD-10-CM | POA: Diagnosis not present

## 2022-07-28 DIAGNOSIS — N186 End stage renal disease: Secondary | ICD-10-CM | POA: Diagnosis not present

## 2022-07-28 DIAGNOSIS — D57219 Sickle-cell/Hb-C disease with crisis, unspecified: Secondary | ICD-10-CM | POA: Diagnosis not present

## 2022-07-28 LAB — BPAM RBC
Blood Product Expiration Date: 202402152359
Blood Product Expiration Date: 202402292359
ISSUE DATE / TIME: 202402061556
Unit Type and Rh: 6200
Unit Type and Rh: 6200

## 2022-07-28 LAB — TYPE AND SCREEN
ABO/RH(D): AB POS
Antibody Screen: NEGATIVE
Unit division: 0
Unit division: 0

## 2022-07-29 ENCOUNTER — Telehealth: Payer: Self-pay | Admitting: Physician Assistant

## 2022-07-29 LAB — CULTURE, BLOOD (ROUTINE X 2): Culture: NO GROWTH

## 2022-07-29 NOTE — Telephone Encounter (Signed)
Transition of care contact from inpatient facility  Date of Discharge: 07/27/22 Date of Contact: 07/29/22 Method of contact: Phone  Attempted to contact patient to discuss transition of care from inpatient admission. Patient did not answer the phone. Message was left on the patient's voicemail. I did speak with patient's HD RN yesterday, pt unable to meet full UF goal due to hypotension. Patient had gained >4kg since hospital discharge the day before. She reminded him to track his fluid intake. He declined extra HD treatment.  Anice Paganini, PA-C 07/29/2022, 9:17 AM  [employer]

## 2022-07-30 ENCOUNTER — Encounter: Payer: Self-pay | Admitting: *Deleted

## 2022-07-30 ENCOUNTER — Telehealth: Payer: Self-pay | Admitting: *Deleted

## 2022-07-30 NOTE — Transitions of Care (Post Inpatient/ED Visit) (Signed)
   07/30/2022  Name: NAMEER SUMMER MRN: [id number] DOB: Jun 13, 1956  Today's TOC FU Call Status: Today's TOC FU Call Status:: Unsuccessul Call (1st Attempt) Unsuccessful Call (1st Attempt) Date: 07/30/22  Attempted to reach the patient regarding the most recent Inpatient visit.  Left voice message requesting call back  Follow Up Plan: Additional outreach attempts will be made to reach the patient to complete the Transitions of Care (Post Inpatient/ED visit) call.   Oneta Rack, RN, BSN, CCRN Alumnus RN CM Care Coordination/ Transition of [place] Management [phone]: direct office

## 2022-07-31 ENCOUNTER — Encounter: Payer: Self-pay | Admitting: *Deleted

## 2022-07-31 ENCOUNTER — Telehealth: Payer: Self-pay | Admitting: *Deleted

## 2022-07-31 DIAGNOSIS — N2581 Secondary hyperparathyroidism of renal origin: Secondary | ICD-10-CM | POA: Diagnosis not present

## 2022-07-31 DIAGNOSIS — D57219 Sickle-cell/Hb-C disease with crisis, unspecified: Secondary | ICD-10-CM | POA: Diagnosis not present

## 2022-07-31 DIAGNOSIS — D571 Sickle-cell disease without crisis: Secondary | ICD-10-CM | POA: Diagnosis not present

## 2022-07-31 DIAGNOSIS — D57819 Other sickle-cell disorders with crisis, unspecified: Secondary | ICD-10-CM | POA: Diagnosis not present

## 2022-07-31 DIAGNOSIS — D631 Anemia in chronic kidney disease: Secondary | ICD-10-CM | POA: Diagnosis not present

## 2022-07-31 DIAGNOSIS — Z992 Dependence on renal dialysis: Secondary | ICD-10-CM | POA: Diagnosis not present

## 2022-07-31 DIAGNOSIS — D5700 Hb-SS disease with crisis, unspecified: Secondary | ICD-10-CM | POA: Diagnosis not present

## 2022-07-31 DIAGNOSIS — N186 End stage renal disease: Secondary | ICD-10-CM | POA: Diagnosis not present

## 2022-07-31 NOTE — Transitions of Care (Post Inpatient/ED Visit) (Signed)
   07/31/2022  Name: ELDER DAVIDIAN MRN: [id number] DOB: [birth year]  Today's TOC FU Call Status: Today's TOC FU Call Status:: Unsuccessful Call (2nd Attempt) Unsuccessful Call (2nd Attempt) Date: 07/31/22  Attempted to reach the patient regarding the most recent Inpatient/ED visit.  Follow Up Plan: Additional outreach attempts will be made to reach the patient to complete the Transitions of Care (Post Inpatient visit) call. Left voice message requesting call back  Oneta Rack, RN, BSN, CCRN Alumnus RN CM Care Coordination/ Transition of [place] Management [phone]: direct office

## 2022-08-01 ENCOUNTER — Telehealth: Payer: Self-pay | Admitting: *Deleted

## 2022-08-01 ENCOUNTER — Encounter: Payer: Self-pay | Admitting: Nurse Practitioner

## 2022-08-01 ENCOUNTER — Encounter: Payer: Self-pay | Admitting: *Deleted

## 2022-08-01 ENCOUNTER — Ambulatory Visit (INDEPENDENT_AMBULATORY_CARE_PROVIDER_SITE_OTHER): Payer: PPO | Admitting: Nurse Practitioner

## 2022-08-01 VITALS — BP 94/65 | HR 97 | Temp 98.1°F | Ht 71.0 in | Wt 152.0 lb

## 2022-08-01 DIAGNOSIS — J189 Pneumonia, unspecified organism: Secondary | ICD-10-CM

## 2022-08-01 DIAGNOSIS — I10 Essential (primary) hypertension: Secondary | ICD-10-CM | POA: Diagnosis not present

## 2022-08-01 NOTE — Progress Notes (Signed)
$@PatientF$  ID: Patrick [name], male    DOB: [birth year], 67 y.o.   MRN: [id number]  Chief Complaint  Patient presents with   Hospitalization Follow-up    Per pt he is better     Referring provider: [name], MD  Recent significant events:  Hospital admission: 07/23/22  Hospital course:  67 y.o. 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  He had left dialysis early on Saturday and stopped taking his meds and has gained some weight since and was noticing increased fatigue, shortness of breath. In the ED tachycardic 118 tachypneic 29 BP 133/96, saturating well on room air.  Labs showed creatinine 5.8 low bicarb 13, hemoglobin 6.9 g MCV 110.Chest x-ray with cardiomegaly central vascular congestion and small right pleural effusion right lung base atelectasis or infiltrate Patient was admitted for fluid overload, a.fib w rvr, anemia and Hypoglycemia , possible pneumonia.Seen by nephrology underwent dialysis 2/6> with significant improvement in his symptoms.  He had A-fib with RVR but it does not fairly controlled, unable to use beta-blocker due to soft BP seen by cardiology.  Symptomology improved after blood transfusion as well, pain management addressed with Dilaudid suspecting pain crisis.  Underwent extra fluid removal with dialysis also got antibiotics at this time he has clinically improved. He is being discharged home in medically stable condition with home health services.  HPI  67 y.o. male with medical history significant of ESRD on HD THS, sickle cell anemia, p A.fib, Chronic systolic CHF, HTN, COPD, ischemia of small intestine with SBO s/p SB resection 05/24/2021 COPD, gallbladder empyema status post  cholecystostomy, status post biliary stent, prolonged QT, gout.  Patient presents today for a hospital follow-up see notes above.  Overall patient states that he is doing well since hospital discharge he is back on his routine dialysis regimen.  He has went back to work.  He was discharged home with home health but did not call to set up a time for them to come visit him.  We will check labs as requested to ED discharge.  Patient did complete antibiotics as directed. Denies f/c/s, n/v/d, hemoptysis, PND, leg swelling Denies chest pain or edema      No Known Allergies  Immunization History  Administered Date(s) Administered   Influenza Inj Mdck Quad Pf 03/21/2018   Influenza,inj,Quad PF,6+ Mos 03/17/2013, 03/08/2014   PFIZER(Purple Top)SARS-COV-2 Vaccination 10/01/2019, 10/26/2019   Pneumococcal Conjugate-13 12/02/2014   Pneumococcal Polysaccharide-23 02/10/2013, 02/28/2021   Tdap 09/24/2013   Zoster Recombinat (Shingrix) 11/03/2020, 01/11/2021    Past Medical History:  Diagnosis Date   A-fib ([place])    Blood dyscrasia    Blood transfusion    "I've had a bunch of them"   CHF (congestive heart failure) ([place])    followed by hf clinic   Chronic kidney disease    Stage 5, dialysis on Tu/Th/Sa   Dialysis patient ([place]) 04/2019   AV fistula (right arm), TTS   Dysrhythmia    A-Fib per patient   Early satiety    Elevated LFTs 04/01/2022   Elevated troponin 04/01/2022   Empyema of gallbladder 05/27/2021   GERD (gastroesophageal reflux disease)    Gout    Legally blind    Metabolic acidosis with increased anion gap and  accumulation of organic acids 04/01/2022   Mitral regurgitation    Muscle tightness-Right arm  05/27/2014   Pneumonia    Pulmonary hypertension (HCC)    Shortness of breath dyspnea    Sickle cell disease, type SS (HCC)    Swelling abdomen    Swelling of extremity, left    Swelling of extremity, right    Tricuspid regurgitation     Tobacco History: Social  History   Tobacco Use  Smoking Status Former   Packs/day: 0.50   Years: 41.00   Total pack years: 20.50   Types: Cigarettes   Quit date: 12/08/2015   Years since quitting: 6.6  Smokeless Tobacco Never   Counseling given: Not Answered   Outpatient Encounter Medications as of 08/01/2022  Medication Sig   acetaminophen (TYLENOL) 500 MG tablet Take 1,500 mg by mouth 3 (three) times daily as needed for mild pain, moderate pain, fever or headache.   allopurinol (ZYLOPRIM) 100 MG tablet TAKE 1 TABLET BY MOUTH EVERY DAY   AURYXIA 1 GM 210 MG(Fe) tablet Take 210-420 mg by mouth See admin instructions. 420 mg with every meal, 210 mg with each snack.   cinacalcet (SENSIPAR) 30 MG tablet Take 1 tablet (30 mg total) by mouth Every Tuesday,Thursday,and Saturday with dialysis.   diphenhydramine-acetaminophen (TYLENOL PM) 25-500 MG TABS tablet Take 2 tablets by mouth at bedtime as needed (sleep/pain).   folic acid (FOLVITE) 1 MG tablet Take 1 tablet (1 mg total) by mouth daily.   hydroxyurea (HYDREA) 500 MG capsule Take 1 capsule (500 mg total) by mouth daily.   Iron-Vitamins (GERITOL PO) Take 15 mLs by mouth daily.   lidocaine-prilocaine (EMLA) cream Apply 1 application. topically Every Tuesday,Thursday,and Saturday with dialysis.   Oxycodone HCl 10 MG TABS Take 1 tablet (10 mg total) by mouth every 6 (six) hours as needed.   pantoprazole (PROTONIX) 40 MG tablet Take 1 tablet (40 mg total) by mouth daily.   tiZANidine (ZANAFLEX) 4 MG tablet TAKE 1/2 TABLET (2MG TOTAL) BY MOUTH EVERY 8 HOURS AS NEEDED FOR MUSCLE SPASM   Torsemide 40 MG TABS Take 40 mg by mouth in the morning and at bedtime.   Vitamin D, Ergocalciferol, (DRISDOL) 1.25 MG (50000 UNIT) CAPS capsule Take 1 capsule (50,000 Units total) by mouth once a week. Monday (Patient not taking: Reported on 08/01/2022)   No facility-administered encounter medications on file as of 08/01/2022.     Review of Systems  Review of Systems   Constitutional: Negative.   HENT: Negative.    Cardiovascular: Negative.   Gastrointestinal: Negative.   Allergic/Immunologic: Negative.   Neurological: Negative.   Psychiatric/Behavioral: Negative.         Physical Exam  BP 94/65   Pulse 97   Temp 98.1 F (36.7 C)   Ht 5' 11"$  (1.803 m)   Wt 152 lb (68.9 kg)   SpO2 100%   BMI 21.20 kg/m   Wt Readings from Last 5 Encounters:  08/01/22 152 lb (68.9 kg)  07/27/22 137 lb 1.6 oz (62.2 kg)  07/02/22 136 lb 6.4 oz (61.9 kg)  06/28/22 142 lb (64.4 kg)  06/10/22 132 lb 12.8 oz (60.2 kg)     Physical Exam Vitals and nursing note reviewed.  Constitutional:      General: He is not in acute distress.    Appearance: He is well-developed.  Cardiovascular:     Rate and Rhythm: Normal rate and regular rhythm.  Pulmonary:     Effort: Pulmonary effort is  normal.     Breath sounds: Normal breath sounds.  Skin:    General: Skin is warm and dry.  Neurological:     Mental Status: He is alert and oriented to person, place, and time.      Lab Results:  CBC    Component Value Date/Time   WBC 6.3 07/27/2022 0047   RBC 1.96 (L) 07/27/2022 0047   HGB 7.5 (L) 07/27/2022 0047   HGB 7.8 (L) 04/17/2022 1505   HCT 20.5 (L) 07/27/2022 0047   HCT 22.6 (L) 04/17/2022 1505   PLT 144 (L) 07/27/2022 0047   PLT 208 04/17/2022 1505   MCV 104.6 (H) 07/27/2022 0047   MCV 101 (H) 04/17/2022 1505   MCH 38.3 (H) 07/27/2022 0047   MCHC 36.6 (H) 07/27/2022 0047   RDW 20.5 (H) 07/27/2022 0047   RDW 27.2 (H) 04/17/2022 1505   LYMPHSABS 0.2 (L) 07/23/2022 1813   LYMPHSABS 0.4 (L) 11/28/2021 1026   MONOABS 1.8 (H) 07/23/2022 1813   EOSABS 0.0 07/23/2022 1813   EOSABS 0.1 11/28/2021 1026   BASOSABS 0.0 07/23/2022 1813   BASOSABS 0.0 11/28/2021 1026    BMET    Component Value Date/Time   NA 132 (L) 07/27/2022 0047   NA 133 (L) 04/17/2022 1425   K 3.2 (L) 07/27/2022 0047   CL 91 (L) 07/27/2022 0047   CO2 31 07/27/2022 0047   GLUCOSE  124 (H) 07/27/2022 0047   BUN 26 (H) 07/27/2022 0047   BUN 41 (H) 04/17/2022 1425   CREATININE 4.05 (H) 07/27/2022 0047   CREATININE 2.90 (H) 08/11/2015 1359   CALCIUM 7.9 (L) 07/27/2022 0047   GFRNONAA 16 (L) 07/27/2022 0047   GFRNONAA 23 (L) 08/11/2015 1359   GFRAA 24 (L) 05/24/2020 1119   GFRAA 27 (L) 08/11/2015 1359    BNP    Component Value Date/Time   BNP 3,312.5 (H) 03/31/2022 1427    ProBNP    Component Value Date/Time   PROBNP 8,929.0 (H) 01/28/2014 1217    Imaging: DG Chest 2 View  Result Date: 07/23/2022 CLINICAL DATA:  Shortness of breath. EXAM: CHEST - 2 VIEW COMPARISON:  Chest radiograph dated 06/07/2022. FINDINGS: Cardiomegaly with central vascular congestion. Small right pleural effusion and right lung base atelectasis or infiltrate, new since the prior radiograph. No pneumothorax. No acute osseous pathology. IMPRESSION: 1. Cardiomegaly with central vascular congestion. 2. Small right pleural effusion and right lung base atelectasis or infiltrate. Follow-up recommended. Electronically Signed   By: Anner Crete M.D.   On: 07/23/2022 17:13   IR Paracentesis  Result Date: 07/04/2022 INDICATION: Patient with a history of cirrhosis presents today with ascites. Interventional radiology asked to perform a diagnostic and therapeutic paracentesis. EXAM: ULTRASOUND GUIDED PARACENTESIS MEDICATIONS: None. COMPLICATIONS: None immediate. PROCEDURE: Informed written consent was obtained from the patient after a discussion of the risks, benefits and alternatives to treatment. A timeout was performed prior to the initiation of the procedure. Initial ultrasound scanning demonstrates a large amount of ascites within the left lower abdominal quadrant. The right lower abdomen was prepped and draped in the usual sterile fashion. 1% lidocaine was used for local anesthesia. Following this, a 19 gauge, 7-cm, Yueh catheter was introduced. An ultrasound image was saved for documentation  purposes. The paracentesis was performed. The catheter was removed and a dressing was applied. The patient tolerated the procedure well without immediate post procedural complication. FINDINGS: A total of approximately 700 mL of light brown fluid was removed. Samples were sent to  the laboratory as requested by the clinical team. IMPRESSION: Successful ultrasound-guided paracentesis yielding 700 mL of peritoneal fluid. Read by: Soyla Dryer, NP Electronically Signed   By: Ruthann Cancer M.D.   On: 07/04/2022 12:14   CT ABDOMEN PELVIS W CONTRAST  Result Date: 07/04/2022 CLINICAL DATA:  Pancreatic cyst." Bile leak". Ascites. Sickle cell anemia. Chronic kidney disease. Heart failure. EXAM: CT ABDOMEN AND PELVIS WITH CONTRAST TECHNIQUE: Multidetector CT imaging of the abdomen and pelvis was performed using the standard protocol following bolus administration of intravenous contrast. RADIATION DOSE REDUCTION: This exam was performed according to the departmental dose-optimization program which includes automated exposure control, adjustment of the mA and/or kV according to patient size and/or use of iterative reconstruction technique. CONTRAST:  12m OMNIPAQUE IOHEXOL 300 MG/ML  SOLN COMPARISON:  06/04/2022 FINDINGS: Lower chest: Left base subsegmental atelectasis. Moderate cardiomegaly, without pericardial or pleural effusion. Decreased trace right pleural fluid and resolved left pleural effusion. Hepatobiliary: Advanced cirrhosis, as evidenced by an irregular hepatic capsule and caudate/lateral segment left liver lobe enlargement. No focal liver lesion. Cholecystectomy, without biliary ductal dilatation. Pneumobilia. Pancreas: Pancreatic atrophy is moderate. No duct dilatation or acute inflammation. Spleen: Calcified, atrophic spleen likely related to sickle cell. Adrenals/Urinary Tract: Normal adrenal glands. Moderate bilateral renal cortical thinning. Left larger than right renal lesions of up to 9 mm are  technically too small to characterize but most likely cysts . In the absence of clinically indicated signs/symptoms require(s) no independent follow-up. No hydronephrosis. Normal urinary bladder. Stomach/Bowel: Normal stomach, without wall thickening. Colonic stool burden suggests constipation. Normal terminal ileum and appendix. Surgical sutures in the region of the ileocecal junction. Normal small bowel. Vascular/Lymphatic: Aortic atherosclerosis. No abdominopelvic adenopathy. Reproductive: Normal prostate. Other: Similar moderate volume abdominopelvic ascites. Is significantly greater in the left upper quadrant and lesser sac, consistent with a component of loculation. Trace fluid tracks along the posterior left hepatic capsule on 22/2, similar. No free intraperitoneal air. Musculoskeletal: Heterogeneous increased marrow density could represent renal osteodystrophy and/or the sequelae of sickle cell. IMPRESSION: 1. Similar moderate volume abdominopelvic fluid, partially loculated within the left upper quadrant and lesser sac. 2. Cirrhosis without focal liver lesion. 3. Decreased trace right and resolved left pleural effusion. 4.  Possible constipation. 5.  Aortic Atherosclerosis (ICD10-I70.0). Electronically Signed   By: KAbigail MiyamotoM.D.   On: 07/04/2022 11:13     Assessment & Plan:   Essential hypertension - CBC - Comprehensive metabolic panel   2. Community acquired pneumonia, unspecified laterality  Completed antibiotics  Feeling much improved  Follow up:  Follow up in 3 months with LSimmie Davies NP 08/01/2022

## 2022-08-01 NOTE — Patient Instructions (Addendum)
1. Essential hypertension  - CBC - Comprehensive metabolic panel   2. Community acquired pneumonia, unspecified laterality  Completed antibiotics  Feeling much improved  Follow up:  Follow up in 3 months with Veleta Miners

## 2022-08-01 NOTE — Assessment & Plan Note (Signed)
-   CBC - Comprehensive metabolic panel   2. Community acquired pneumonia, unspecified laterality  Completed antibiotics  Feeling much improved  Follow up:  Follow up in 3 months with Veleta Miners

## 2022-08-01 NOTE — Transitions of Care (Post Inpatient/ED Visit) (Signed)
   08/01/2022  Name: Patrick [name] MRN: [id number] DOB: [birth year]  Today's TOC FU Call Status: Today's TOC FU Call Status:: Unsuccessful Call (3rd Attempt) Unsuccessful Call (3rd Attempt) Date: 08/01/22  Attempted to reach the patient regarding the most recent Inpatient/ED visit. Left voice message requesting call back  Follow Up Plan: No further outreach attempts will be made at this time. We have been unable to contact the patient.  Oneta Rack, RN, BSN, CCRN Alumnus RN CM Care Coordination/ Transition of [place] Management [phone]: direct office

## 2022-08-02 DIAGNOSIS — Z992 Dependence on renal dialysis: Secondary | ICD-10-CM | POA: Diagnosis not present

## 2022-08-02 DIAGNOSIS — Z515 Encounter for palliative care: Secondary | ICD-10-CM | POA: Diagnosis not present

## 2022-08-02 DIAGNOSIS — N186 End stage renal disease: Secondary | ICD-10-CM | POA: Diagnosis not present

## 2022-08-02 DIAGNOSIS — I4891 Unspecified atrial fibrillation: Secondary | ICD-10-CM | POA: Diagnosis not present

## 2022-08-02 DIAGNOSIS — I5042 Chronic combined systolic (congestive) and diastolic (congestive) heart failure: Secondary | ICD-10-CM | POA: Diagnosis not present

## 2022-08-02 LAB — CBC
Hematocrit: 20.4 % — ABNORMAL LOW (ref 37.5–51.0)
Hemoglobin: 7.3 g/dL — ABNORMAL LOW (ref 13.0–17.7)
MCH: 38.2 pg — ABNORMAL HIGH (ref 26.6–33.0)
MCHC: 35.8 g/dL — ABNORMAL HIGH (ref 31.5–35.7)
MCV: 107 fL — ABNORMAL HIGH (ref 79–97)
NRBC: 6 % — ABNORMAL HIGH (ref 0–0)
Platelets: 173 10*3/uL (ref 150–450)
RBC: 1.91 x10E6/uL — CL (ref 4.14–5.80)
RDW: 21.2 % — ABNORMAL HIGH (ref 11.6–15.4)
WBC: 4.9 10*3/uL (ref 3.4–10.8)

## 2022-08-02 LAB — COMPREHENSIVE METABOLIC PANEL
ALT: 15 IU/L (ref 0–44)
AST: 21 IU/L (ref 0–40)
Albumin/Globulin Ratio: 1.3 (ref 1.2–2.2)
Albumin: 4.1 g/dL (ref 3.9–4.9)
Alkaline Phosphatase: 265 IU/L — ABNORMAL HIGH (ref 44–121)
BUN/Creatinine Ratio: 6 — ABNORMAL LOW (ref 10–24)
BUN: 41 mg/dL — ABNORMAL HIGH (ref 8–27)
Bilirubin Total: 1.3 mg/dL — ABNORMAL HIGH (ref 0.0–1.2)
CO2: 23 mmol/L (ref 20–29)
Calcium: 7.6 mg/dL — ABNORMAL LOW (ref 8.6–10.2)
Chloride: 93 mmol/L — ABNORMAL LOW (ref 96–106)
Creatinine, Ser: 6.96 mg/dL — ABNORMAL HIGH (ref 0.76–1.27)
Globulin, Total: 3.2 g/dL (ref 1.5–4.5)
Glucose: 110 mg/dL — ABNORMAL HIGH (ref 70–99)
Potassium: 4.3 mmol/L (ref 3.5–5.2)
Sodium: 138 mmol/L (ref 134–144)
Total Protein: 7.3 g/dL (ref 6.0–8.5)
eGFR: 8 mL/min/{1.73_m2} — ABNORMAL LOW (ref >59)

## 2022-08-07 DIAGNOSIS — D631 Anemia in chronic kidney disease: Secondary | ICD-10-CM | POA: Diagnosis not present

## 2022-08-07 DIAGNOSIS — D5700 Hb-SS disease with crisis, unspecified: Secondary | ICD-10-CM | POA: Diagnosis not present

## 2022-08-07 DIAGNOSIS — N2581 Secondary hyperparathyroidism of renal origin: Secondary | ICD-10-CM | POA: Diagnosis not present

## 2022-08-07 DIAGNOSIS — D57219 Sickle-cell/Hb-C disease with crisis, unspecified: Secondary | ICD-10-CM | POA: Diagnosis not present

## 2022-08-07 DIAGNOSIS — D57819 Other sickle-cell disorders with crisis, unspecified: Secondary | ICD-10-CM | POA: Diagnosis not present

## 2022-08-07 DIAGNOSIS — Z992 Dependence on renal dialysis: Secondary | ICD-10-CM | POA: Diagnosis not present

## 2022-08-07 DIAGNOSIS — D571 Sickle-cell disease without crisis: Secondary | ICD-10-CM | POA: Diagnosis not present

## 2022-08-07 DIAGNOSIS — N186 End stage renal disease: Secondary | ICD-10-CM | POA: Diagnosis not present

## 2022-08-07 DIAGNOSIS — D509 Iron deficiency anemia, unspecified: Secondary | ICD-10-CM | POA: Diagnosis not present

## 2022-08-14 DIAGNOSIS — D57819 Other sickle-cell disorders with crisis, unspecified: Secondary | ICD-10-CM | POA: Diagnosis not present

## 2022-08-14 DIAGNOSIS — D57219 Sickle-cell/Hb-C disease with crisis, unspecified: Secondary | ICD-10-CM | POA: Diagnosis not present

## 2022-08-14 DIAGNOSIS — D5700 Hb-SS disease with crisis, unspecified: Secondary | ICD-10-CM | POA: Diagnosis not present

## 2022-08-14 DIAGNOSIS — N186 End stage renal disease: Secondary | ICD-10-CM | POA: Diagnosis not present

## 2022-08-14 DIAGNOSIS — D571 Sickle-cell disease without crisis: Secondary | ICD-10-CM | POA: Diagnosis not present

## 2022-08-14 DIAGNOSIS — Z992 Dependence on renal dialysis: Secondary | ICD-10-CM | POA: Diagnosis not present

## 2022-08-14 DIAGNOSIS — N2581 Secondary hyperparathyroidism of renal origin: Secondary | ICD-10-CM | POA: Diagnosis not present

## 2022-08-14 DIAGNOSIS — D631 Anemia in chronic kidney disease: Secondary | ICD-10-CM | POA: Diagnosis not present

## 2022-08-15 ENCOUNTER — Telehealth: Payer: Self-pay | Admitting: Internal Medicine

## 2022-08-15 ENCOUNTER — Other Ambulatory Visit: Payer: Self-pay | Admitting: Family Medicine

## 2022-08-15 DIAGNOSIS — D571 Sickle-cell disease without crisis: Secondary | ICD-10-CM

## 2022-08-15 MED ORDER — OXYCODONE HCL 10 MG PO TABS: 10.0000 mg | ORAL_TABLET | Freq: Four times a day (QID) | ORAL | 0 refills | Status: DC | PRN

## 2022-08-15 NOTE — Progress Notes (Signed)
Reviewed PDMP substance reporting system prior to prescribing opiate medications. No inconsistencies noted.  Meds ordered this encounter  Medications   Oxycodone HCl 10 MG TABS    Sig: Take 1 tablet (10 mg total) by mouth every 6 (six) hours as needed.    Dispense:  60 tablet    Refill:  0    Order Specific Question:   Supervising Provider    Answer:   [name] [id number]   [name]  APRN, MSN, FNP-C [facility] [address] [place], [place] [address] [phone]

## 2022-08-15 NOTE — Telephone Encounter (Signed)
Caller & Relationship to patient:  MRN #  [id number]   Call Back Number:   Date of Last Office Visit: 08/15/2022     Date of Next Office Visit: 10/30/2022    Medication(s) to be Refilled: Oxy Back spasm  Preferred Pharmacy:   ** Please notify patient to allow 48-72 hours to process** **Let patient know to contact pharmacy at the end of the day to make sure medication is ready. ** **If patient has not been seen in a year or longer, book an appointment **Advise to use MyChart for refill requests OR to contact their pharmacy

## 2022-08-15 NOTE — Telephone Encounter (Signed)
Called patient to schedule Medicare Annual Wellness Visit (AWV). Left message for patient to call back and schedule Medicare Annual Wellness Visit (AWV).  Last date of AWV: 04/30/2018   Please schedule an appointment at any time with [place], [facility].  If any questions, please contact me at [phone].    Thank you,  [place] Direct dial  [phone]

## 2022-08-16 DIAGNOSIS — I158 Other secondary hypertension: Secondary | ICD-10-CM | POA: Diagnosis not present

## 2022-08-16 DIAGNOSIS — Z992 Dependence on renal dialysis: Secondary | ICD-10-CM | POA: Diagnosis not present

## 2022-08-16 DIAGNOSIS — N186 End stage renal disease: Secondary | ICD-10-CM | POA: Diagnosis not present

## 2022-08-18 DIAGNOSIS — N2581 Secondary hyperparathyroidism of renal origin: Secondary | ICD-10-CM | POA: Diagnosis not present

## 2022-08-18 DIAGNOSIS — Z992 Dependence on renal dialysis: Secondary | ICD-10-CM | POA: Diagnosis not present

## 2022-08-18 DIAGNOSIS — D57219 Sickle-cell/Hb-C disease with crisis, unspecified: Secondary | ICD-10-CM | POA: Diagnosis not present

## 2022-08-18 DIAGNOSIS — D5700 Hb-SS disease with crisis, unspecified: Secondary | ICD-10-CM | POA: Diagnosis not present

## 2022-08-18 DIAGNOSIS — D57819 Other sickle-cell disorders with crisis, unspecified: Secondary | ICD-10-CM | POA: Diagnosis not present

## 2022-08-18 DIAGNOSIS — D571 Sickle-cell disease without crisis: Secondary | ICD-10-CM | POA: Diagnosis not present

## 2022-08-18 DIAGNOSIS — N186 End stage renal disease: Secondary | ICD-10-CM | POA: Diagnosis not present

## 2022-08-21 ENCOUNTER — Telehealth: Payer: Self-pay | Admitting: Internal Medicine

## 2022-08-21 ENCOUNTER — Other Ambulatory Visit: Payer: Self-pay | Admitting: Family Medicine

## 2022-08-21 DIAGNOSIS — N2581 Secondary hyperparathyroidism of renal origin: Secondary | ICD-10-CM | POA: Diagnosis not present

## 2022-08-21 DIAGNOSIS — N186 End stage renal disease: Secondary | ICD-10-CM | POA: Diagnosis not present

## 2022-08-21 DIAGNOSIS — D5700 Hb-SS disease with crisis, unspecified: Secondary | ICD-10-CM | POA: Diagnosis not present

## 2022-08-21 DIAGNOSIS — D57819 Other sickle-cell disorders with crisis, unspecified: Secondary | ICD-10-CM | POA: Diagnosis not present

## 2022-08-21 DIAGNOSIS — D631 Anemia in chronic kidney disease: Secondary | ICD-10-CM | POA: Diagnosis not present

## 2022-08-21 DIAGNOSIS — Z992 Dependence on renal dialysis: Secondary | ICD-10-CM | POA: Diagnosis not present

## 2022-08-21 DIAGNOSIS — M545 Low back pain, unspecified: Secondary | ICD-10-CM

## 2022-08-21 DIAGNOSIS — D57219 Sickle-cell/Hb-C disease with crisis, unspecified: Secondary | ICD-10-CM | POA: Diagnosis not present

## 2022-08-21 DIAGNOSIS — D571 Sickle-cell disease without crisis: Secondary | ICD-10-CM | POA: Diagnosis not present

## 2022-08-21 MED ORDER — TIZANIDINE HCL 4 MG PO TABS: ORAL_TABLET | ORAL | 2 refills | Status: DC

## 2022-08-21 NOTE — Telephone Encounter (Signed)
Tizanidine refill

## 2022-08-21 NOTE — Progress Notes (Signed)
Meds ordered this encounter  Medications   tiZANidine (ZANAFLEX) 4 MG tablet    Sig: TAKE 1/2 TABLET ('2MG'$  TOTAL) BY MOUTH EVERY 8 HOURS AS NEEDED FOR MUSCLE SPASM    Dispense:  30 tablet    Refill:  2    Order Specific Question:   Supervising Provider    AnswerTresa Garter [id number]

## 2022-08-22 NOTE — Telephone Encounter (Signed)
Med was filled yesterday. [place]

## 2022-08-27 ENCOUNTER — Telehealth: Payer: Self-pay | Admitting: *Deleted

## 2022-08-27 NOTE — Progress Notes (Signed)
  Care Coordination  Outreach Note  08/27/2022 Name: ELFORD EVILSIZER MRN: [id number] DOB: 04/28/1956   Care Coordination Outreach Attempts: An unsuccessful telephone outreach was attempted today to offer the patient information about available care coordination services as a benefit of their health plan.   Follow Up Plan:  Additional outreach attempts will be made to offer the patient care coordination information and services.   Encounter Outcome:  No Answer  [place]  Direct Dial: [phone]

## 2022-08-28 DIAGNOSIS — D631 Anemia in chronic kidney disease: Secondary | ICD-10-CM | POA: Diagnosis not present

## 2022-08-28 DIAGNOSIS — D57219 Sickle-cell/Hb-C disease with crisis, unspecified: Secondary | ICD-10-CM | POA: Diagnosis not present

## 2022-08-28 DIAGNOSIS — D5700 Hb-SS disease with crisis, unspecified: Secondary | ICD-10-CM | POA: Diagnosis not present

## 2022-08-28 DIAGNOSIS — N186 End stage renal disease: Secondary | ICD-10-CM | POA: Diagnosis not present

## 2022-08-28 DIAGNOSIS — Z992 Dependence on renal dialysis: Secondary | ICD-10-CM | POA: Diagnosis not present

## 2022-08-28 DIAGNOSIS — D571 Sickle-cell disease without crisis: Secondary | ICD-10-CM | POA: Diagnosis not present

## 2022-08-28 DIAGNOSIS — N2581 Secondary hyperparathyroidism of renal origin: Secondary | ICD-10-CM | POA: Diagnosis not present

## 2022-08-28 DIAGNOSIS — D57819 Other sickle-cell disorders with crisis, unspecified: Secondary | ICD-10-CM | POA: Diagnosis not present

## 2022-08-28 NOTE — Progress Notes (Unsigned)
  Care Coordination  Outreach Note  08/28/2022 Name: Patrick [name] MRN: [id number] DOB: 1956-04-17   Care Coordination Outreach Attempts: A second unsuccessful outreach was attempted today to offer the patient with information about available care coordination services as a benefit of their health plan.     Follow Up Plan:  Additional outreach attempts will be made to offer the patient care coordination information and services.   Encounter Outcome:  No Answer  [place]  Direct Dial: [phone]

## 2022-08-29 NOTE — Progress Notes (Signed)
  Care Coordination  Outreach Note  08/29/2022 Name: Patrick [name] MRN: [id number] DOB: [birth year]   Care Coordination Outreach Attempts: A third unsuccessful outreach was attempted today to offer the patient with information about available care coordination services as a benefit of their health plan.   Follow Up Plan:  No further outreach attempts will be made at this time. We have been unable to contact the patient to offer or enroll patient in care coordination services  Encounter Outcome:  No Answer  [place]: [phone]

## 2022-08-29 NOTE — Progress Notes (Signed)
  Care Coordination   Note   08/29/2022 Name: Patrick [name] MRN: [id number] DOB: 18-Jan-1956  Patrick [name] is a 68 y.o. year old male who sees [name], MD for primary care. I reached out to [place] by phone today to offer care coordination services.  [name] was given information about Care Coordination services today including:   The Care Coordination services include support from the care team which includes your Nurse Coordinator, Clinical Social Worker, or Pharmacist.  The Care Coordination team is here to help remove barriers to the health concerns and goals most important to you. Care Coordination services are voluntary, and the patient may decline or stop services at any time by request to their care team member.   Care Coordination Consent Status: Patient agreed to services and verbal consent obtained.   Follow up plan:  Telephone appointment with care coordination team member scheduled for:  09/05/22  Encounter Outcome:  Pt. Scheduled  [place]  Direct Dial: [phone]

## 2022-09-04 DIAGNOSIS — D5700 Hb-SS disease with crisis, unspecified: Secondary | ICD-10-CM | POA: Diagnosis not present

## 2022-09-04 DIAGNOSIS — D57219 Sickle-cell/Hb-C disease with crisis, unspecified: Secondary | ICD-10-CM | POA: Diagnosis not present

## 2022-09-04 DIAGNOSIS — D571 Sickle-cell disease without crisis: Secondary | ICD-10-CM | POA: Diagnosis not present

## 2022-09-04 DIAGNOSIS — N186 End stage renal disease: Secondary | ICD-10-CM | POA: Diagnosis not present

## 2022-09-04 DIAGNOSIS — Z992 Dependence on renal dialysis: Secondary | ICD-10-CM | POA: Diagnosis not present

## 2022-09-04 DIAGNOSIS — D57819 Other sickle-cell disorders with crisis, unspecified: Secondary | ICD-10-CM | POA: Diagnosis not present

## 2022-09-04 DIAGNOSIS — D631 Anemia in chronic kidney disease: Secondary | ICD-10-CM | POA: Diagnosis not present

## 2022-09-04 DIAGNOSIS — D509 Iron deficiency anemia, unspecified: Secondary | ICD-10-CM | POA: Diagnosis not present

## 2022-09-04 DIAGNOSIS — N2581 Secondary hyperparathyroidism of renal origin: Secondary | ICD-10-CM | POA: Diagnosis not present

## 2022-09-05 ENCOUNTER — Ambulatory Visit: Payer: Self-pay

## 2022-09-05 ENCOUNTER — Telehealth: Payer: Self-pay | Admitting: Internal Medicine

## 2022-09-05 NOTE — Telephone Encounter (Signed)
Called patient to schedule Medicare Annual Wellness Visit (AWV). Left message for patient to call back and schedule Medicare Annual Wellness Visit (AWV).  Last date of AWV: 04/30/2018   Please schedule an AWVS appointment at any time with [place].  If any questions, please contact me at [phone].    Thank you,  [place] Direct dial  [phone]

## 2022-09-05 NOTE — Patient Instructions (Addendum)
Visit Information  Thank you for taking time to visit with me today. Please don't hesitate to contact me if I can be of assistance to you.   Following are the goals we discussed today:   Goals Addressed             This Visit's Progress    assist with health management       Interventions Today    Flowsheet Row Most Recent Value  Chronic Disease   Chronic disease during today's visit Chronic Kidney Disease/End Stage Renal Disease (ESRD), Atrial Fibrillation (AFib)  General Interventions   General Interventions Discussed/Reviewed General Interventions Discussed, Doctor Visits  [evaluation of current treatment plan related to recent hospitalization and patient's adherence to plan as established by provider. provided RNCM contact number and encouraged to call as needed with care coordination needs.]  Doctor Visits Discussed/Reviewed Doctor Visits Discussed, Annual Wellness Visits, PCP  [reviewed instructions per office visit with PCP on 08/01/22. patient due for AWV-contact number provided and RNCM encouraged to call to schedule.]  PCP/Specialist Visits Compliance with follow-up visit  Education Interventions   Education Provided Provided Education  Provided Verbal Education On [employer]  [provided contact number for [facility] ([phone].]  Pharmacy Interventions   Pharmacy Dicussed/Reviewed Pharmacy Topics Discussed, Medications and their functions, Medication Adherence, Affording Medications  [medication discrepancy noted per AVS, Epic and patient-PCP notified regarding the need for clarification.]            Our next appointment is by telephone on 09/19/22 at 3:30  Please call the care guide team at [phone] if you need to cancel or reschedule your appointment.   If you are experiencing a Mental Health or [place] or need someone to talk to, please call the Suicide and Crisis Lifeline: [place], RN, MSN, BSN, [employer] [phone]

## 2022-09-05 NOTE — Patient Outreach (Signed)
  Care Coordination   Initial Visit Note   09/05/2022 Name: LEANORD DEITRICH MRN: [id number] DOB: 10/10/1955  Aleister D Jewart is a 67 y.o. year old male who sees [name], MD for primary care. I spoke with  Devlin D Reyburn by phone today.  What matters to the patients health and wellness today?  Patient reports he is doing better since admission on 07/23/22-07/27/22. Follow up visit with PCP 08/01/22. Mr. Davin reports he is legally blind and has been in contact with [facility], but does not have an updated contact number. Medications reviewed. Discrepancy noted: Mr. Cesario states he has been taking Torsemide four times/day(unsure of the mg). Per chart dose is 40 mg morning and night until 08/25/22. Per latest AVS with PCP on 08/01/22 -Torsemide 40 mg morning and night.  Goals Addressed             This Visit's Progress    assist with health management       Interventions Today    Flowsheet Row Most Recent Value  Chronic Disease   Chronic disease during today's visit Chronic Kidney Disease/End Stage Renal Disease (ESRD), Atrial Fibrillation (AFib)  General Interventions   General Interventions Discussed/Reviewed General Interventions Discussed, Doctor Visits  [evaluation of current treatment plan related to recent hospitalization and patient's adherence to plan as established by provider. provided RNCM contact number and encouraged to call as needed with care coordination needs.]  Doctor Visits Discussed/Reviewed Doctor Visits Discussed, Annual Wellness Visits, PCP  [reviewed instructions per office visit with PCP on 08/01/22. patient due for AWV-contact number provided and RNCM encouraged to call to schedule.]  PCP/Specialist Visits Compliance with follow-up visit  Education Interventions   Education Provided Provided Education  Provided Verbal Education On [employer]  [provided contact number for [facility]  ([phone].]  Pharmacy Interventions   Pharmacy Dicussed/Reviewed Pharmacy Topics Discussed, Medications and their functions, Medication Adherence, Affording Medications  [medication discrepancy noted per AVS, Epic and patient-PCP notified for clarification.]            SDOH assessments and interventions completed:    SDOH Interventions Today    Flowsheet Row Most Recent Value  SDOH Interventions   Food Insecurity Interventions Intervention Not Indicated  Housing Interventions Intervention Not Indicated  Transportation Interventions Intervention Not Indicated  Utilities Interventions Intervention Not Indicated     Care Coordination Interventions:  Yes, provided   Follow up plan: Follow up call scheduled for 09/19/22    Encounter Outcome:  Pt. Visit Completed   Thea Silversmith, RN, MSN, BSN, [place] Coordinator [phone]

## 2022-09-06 ENCOUNTER — Telehealth: Payer: Self-pay

## 2022-09-06 NOTE — Patient Outreach (Addendum)
  Care Coordination   09/06/2022 Name: Patrick [name] MRN: [id number] DOB: 11/25/1955   Care Coordination Outreach Attempts: RNCM received follow up message from PCP clarifying that patient needs to take Torsemide 40 mg in morning and in evening and needs to follow up with cardiology. An unsuccessful telephone outreach was attempted today.      Follow Up Plan:  Additional outreach attempts will be made.   Encounter Outcome:  No Answer   Care Coordination Interventions:  No, not indicated    Thea Silversmith, RN, MSN, BSN, [place] Coordinator [phone]

## 2022-09-07 ENCOUNTER — Encounter: Payer: Self-pay | Admitting: *Deleted

## 2022-09-07 ENCOUNTER — Telehealth: Payer: Self-pay

## 2022-09-07 NOTE — Patient Outreach (Signed)
  Care Coordination   09/07/2022 Name: Patrick [name] MRN: [id number] DOB: November 24, 1955   Care Coordination Outreach Attempts: RNCM received follow up message from PCP clarifying that patient needs to take Torsemide 40 mg in morning and in evening and needs to follow up with cardiology. An unsuccessful telephone outreach was attempted today.   Follow Up Plan:  Additional outreach attempts will be made.   Encounter Outcome:  No Answer   Care Coordination Interventions:  No, not indicated    Thea Silversmith, RN, MSN, BSN, [place] Coordinator [phone]

## 2022-09-10 ENCOUNTER — Other Ambulatory Visit: Payer: Self-pay | Admitting: Family Medicine

## 2022-09-10 ENCOUNTER — Telehealth: Payer: Self-pay

## 2022-09-10 DIAGNOSIS — D571 Sickle-cell disease without crisis: Secondary | ICD-10-CM

## 2022-09-10 MED ORDER — OXYCODONE HCL 10 MG PO TABS: 10.0000 mg | ORAL_TABLET | Freq: Four times a day (QID) | ORAL | 0 refills | Status: DC | PRN

## 2022-09-10 NOTE — Telephone Encounter (Signed)
Caller & Relationship to patient:  MRN #  [id number]   Call Back Number:   Date of Last Office Visit: 08/21/2022     Date of Next Office Visit: 10/30/2022    Medication(s) to be Refilled: Oxycodone  Preferred Pharmacy:   ** Please notify patient to allow 48-72 hours to process** **Let patient know to contact pharmacy at the end of the day to make sure medication is ready. ** **If patient has not been seen in a year or longer, book an appointment **Advise to use MyChart for refill requests OR to contact their pharmacy

## 2022-09-10 NOTE — Progress Notes (Signed)
Reviewed PDMP substance reporting system prior to prescribing opiate medications. No inconsistencies noted.  Meds ordered this encounter  Medications   Oxycodone HCl 10 MG TABS    Sig: Take 1 tablet (10 mg total) by mouth every 6 (six) hours as needed.    Dispense:  60 tablet    Refill:  0    Order Specific Question:   Supervising Provider    Answer:   JEGEDE, OLUGBEMIGA E [[id number]]   Javanni Maring Moore Dollie Bressi  APRN, MSN, FNP-C [facility] [address]  [place], [place] [address] [phone]  

## 2022-09-11 ENCOUNTER — Telehealth: Payer: Self-pay

## 2022-09-11 DIAGNOSIS — D631 Anemia in chronic kidney disease: Secondary | ICD-10-CM | POA: Diagnosis not present

## 2022-09-11 DIAGNOSIS — D57819 Other sickle-cell disorders with crisis, unspecified: Secondary | ICD-10-CM | POA: Diagnosis not present

## 2022-09-11 DIAGNOSIS — N186 End stage renal disease: Secondary | ICD-10-CM | POA: Diagnosis not present

## 2022-09-11 DIAGNOSIS — Z992 Dependence on renal dialysis: Secondary | ICD-10-CM | POA: Diagnosis not present

## 2022-09-11 DIAGNOSIS — D57219 Sickle-cell/Hb-C disease with crisis, unspecified: Secondary | ICD-10-CM | POA: Diagnosis not present

## 2022-09-11 DIAGNOSIS — D5700 Hb-SS disease with crisis, unspecified: Secondary | ICD-10-CM | POA: Diagnosis not present

## 2022-09-11 DIAGNOSIS — N2581 Secondary hyperparathyroidism of renal origin: Secondary | ICD-10-CM | POA: Diagnosis not present

## 2022-09-11 DIAGNOSIS — D571 Sickle-cell disease without crisis: Secondary | ICD-10-CM | POA: Diagnosis not present

## 2022-09-11 NOTE — Patient Outreach (Signed)
  Care Coordination   09/11/2022 Name: Patrick [name] MRN: [id number] DOB: 27-Apr-1956   Care Coordination Outreach Attempts: A third unsuccessful outreach attempt today to follow up with patient and advise of PCP instructions: Torsemide is 40 mg morning and 40 mg at night. In addition patient needs to make follow up appointment with cardiologist. Message left.  Follow Up Plan: follow up at next scheduled outreach-PCP updated.  Encounter Outcome:  No Answer   Care Coordination Interventions:  No, not indicated    Thea Silversmith, RN, MSN, BSN, [place] Coordinator [phone]

## 2022-09-13 ENCOUNTER — Telehealth: Payer: Self-pay | Admitting: Internal Medicine

## 2022-09-13 NOTE — Telephone Encounter (Signed)
Called patient to schedule Medicare Annual Wellness Visit (AWV). Left message for patient to call back and schedule Medicare Annual Wellness Visit (AWV).  Last date of AWV: 04/30/2018   Please schedule an AWVS appointment at any time with [facility].  If any questions, please contact me at [phone].    Thank you,  [place] Direct dial  [phone]

## 2022-09-16 DIAGNOSIS — N186 End stage renal disease: Secondary | ICD-10-CM | POA: Diagnosis not present

## 2022-09-16 DIAGNOSIS — I158 Other secondary hypertension: Secondary | ICD-10-CM | POA: Diagnosis not present

## 2022-09-16 DIAGNOSIS — Z992 Dependence on renal dialysis: Secondary | ICD-10-CM | POA: Diagnosis not present

## 2022-09-18 DIAGNOSIS — D57219 Sickle-cell/Hb-C disease with crisis, unspecified: Secondary | ICD-10-CM | POA: Diagnosis not present

## 2022-09-18 DIAGNOSIS — N2581 Secondary hyperparathyroidism of renal origin: Secondary | ICD-10-CM | POA: Diagnosis not present

## 2022-09-18 DIAGNOSIS — D57819 Other sickle-cell disorders with crisis, unspecified: Secondary | ICD-10-CM | POA: Diagnosis not present

## 2022-09-18 DIAGNOSIS — D5700 Hb-SS disease with crisis, unspecified: Secondary | ICD-10-CM | POA: Diagnosis not present

## 2022-09-18 DIAGNOSIS — D571 Sickle-cell disease without crisis: Secondary | ICD-10-CM | POA: Diagnosis not present

## 2022-09-18 DIAGNOSIS — N186 End stage renal disease: Secondary | ICD-10-CM | POA: Diagnosis not present

## 2022-09-18 DIAGNOSIS — D631 Anemia in chronic kidney disease: Secondary | ICD-10-CM | POA: Diagnosis not present

## 2022-09-18 DIAGNOSIS — Z992 Dependence on renal dialysis: Secondary | ICD-10-CM | POA: Diagnosis not present

## 2022-09-19 ENCOUNTER — Telehealth: Payer: Self-pay

## 2022-09-19 NOTE — Patient Outreach (Signed)
  Care Coordination   09/19/2022 Name: Patrick [name] MRN: [id number] DOB: 1956-01-01   Care Coordination Outreach Attempts:  An unsuccessful telephone outreach was attempted for a scheduled appointment today.  Follow Up Plan:  Additional outreach attempts will be made to offer the patient care coordination information and services.   Encounter Outcome:  No Answer   Care Coordination Interventions:  No, not indicated    Thea Silversmith, RN, MSN, BSN, [place] Coordinator [phone]

## 2022-09-21 ENCOUNTER — Telehealth: Payer: Self-pay | Admitting: *Deleted

## 2022-09-21 NOTE — Progress Notes (Signed)
  Care Coordination Note  09/21/2022 Name: Patrick [name] MRN: [id number] DOB: 04-Mar-1956  Patrick [name] is a 67 y.o. year old male who is a primary care patient of Jegede, [name], MD and is actively engaged with the care management team. I reached out to Olan D Jeannette How by phone today to assist with re-scheduling a follow up visit with the RN Case Manager  Follow up plan: Unsuccessful telephone outreach attempt made. A HIPAA compliant phone message was left for the patient providing contact information and requesting a return call.   Burman Nieves, CCMA Care Coordination Care Guide Direct Dial: [phone]

## 2022-09-24 ENCOUNTER — Other Ambulatory Visit: Payer: Self-pay | Admitting: Family Medicine

## 2022-09-24 DIAGNOSIS — M545 Low back pain, unspecified: Secondary | ICD-10-CM

## 2022-09-24 DIAGNOSIS — D571 Sickle-cell disease without crisis: Secondary | ICD-10-CM

## 2022-09-24 MED ORDER — OXYCODONE HCL 10 MG PO TABS: 10.0000 mg | ORAL_TABLET | Freq: Four times a day (QID) | ORAL | 0 refills | Status: DC | PRN

## 2022-09-24 MED ORDER — TIZANIDINE HCL 4 MG PO TABS: ORAL_TABLET | ORAL | 2 refills | Status: DC

## 2022-09-24 NOTE — Telephone Encounter (Signed)
Please advise  due to Dr. Hyman Hopes being out. KH

## 2022-09-24 NOTE — Progress Notes (Signed)
Reviewed PDMP substance reporting system prior to prescribing opiate medications. No inconsistencies noted.  Meds ordered this encounter  Medications   Oxycodone HCl 10 MG TABS    Sig: Take 1 tablet (10 mg total) by mouth every 6 (six) hours as needed.    Dispense:  60 tablet    Refill:  0    Order Specific Question:   Supervising Provider    Answer:   JEGEDE, OLUGBEMIGA E [[id number]]   tiZANidine (ZANAFLEX) 4 MG tablet    Sig: TAKE 1/2 TABLET (2MG TOTAL) BY MOUTH EVERY 8 HOURS AS NEEDED FOR MUSCLE SPASM    Dispense:  30 tablet    Refill:  2    Order Specific Question:   Supervising Provider    Answer:   JEGEDE, OLUGBEMIGA E [[id number]]   Patrick Coryell Moore Palyn Scrima  APRN, MSN, FNP-C [facility] [address]  [place], [place] [address] [phone]  

## 2022-09-24 NOTE — Telephone Encounter (Signed)
Caller & Relationship to patient:  MRN #  [id number]   Call Back Number:   Date of Last Office Visit: 09/19/2022     Date of Next Office Visit: Visit date not found    Medication(s) to be Refilled: Oxycodone , Torsemide diphenhydramine   Preferred Pharmacy:   ** Please notify patient to allow 48-72 hours to process** **Let patient know to contact pharmacy at the end of the day to make sure medication is ready. ** **If patient has not been seen in a year or longer, book an appointment **Advise to use MyChart for refill requests OR to contact their pharmacy

## 2022-09-26 ENCOUNTER — Other Ambulatory Visit: Payer: Self-pay | Admitting: Nurse Practitioner

## 2022-09-26 MED ORDER — DIPHENHYDRAMINE-APAP (SLEEP) 25-500 MG PO TABS: 2.0000 | ORAL_TABLET | Freq: Every evening | ORAL | 3 refills | Status: DC | PRN

## 2022-09-26 MED ORDER — TORSEMIDE 40 MG PO TABS: 40.0000 mg | ORAL_TABLET | Freq: Two times a day (BID) | ORAL | 2 refills | Status: DC

## 2022-09-27 ENCOUNTER — Ambulatory Visit (INDEPENDENT_AMBULATORY_CARE_PROVIDER_SITE_OTHER): Payer: PPO

## 2022-09-27 VITALS — Ht 71.0 in | Wt 134.0 lb

## 2022-09-27 DIAGNOSIS — D57219 Sickle-cell/Hb-C disease with crisis, unspecified: Secondary | ICD-10-CM | POA: Diagnosis not present

## 2022-09-27 DIAGNOSIS — N186 End stage renal disease: Secondary | ICD-10-CM | POA: Diagnosis not present

## 2022-09-27 DIAGNOSIS — D57819 Other sickle-cell disorders with crisis, unspecified: Secondary | ICD-10-CM | POA: Diagnosis not present

## 2022-09-27 DIAGNOSIS — N2581 Secondary hyperparathyroidism of renal origin: Secondary | ICD-10-CM | POA: Diagnosis not present

## 2022-09-27 DIAGNOSIS — D571 Sickle-cell disease without crisis: Secondary | ICD-10-CM | POA: Diagnosis not present

## 2022-09-27 DIAGNOSIS — Z0000 Encounter for general adult medical examination without abnormal findings: Secondary | ICD-10-CM | POA: Diagnosis not present

## 2022-09-27 DIAGNOSIS — Z992 Dependence on renal dialysis: Secondary | ICD-10-CM | POA: Diagnosis not present

## 2022-09-27 DIAGNOSIS — D631 Anemia in chronic kidney disease: Secondary | ICD-10-CM | POA: Diagnosis not present

## 2022-09-27 DIAGNOSIS — D5700 Hb-SS disease with crisis, unspecified: Secondary | ICD-10-CM | POA: Diagnosis not present

## 2022-09-27 NOTE — Progress Notes (Addendum)
Subjective:   Patrick [name] is a 67 y.o. male who presents for Medicare Annual/Subsequent preventive examination.  Review of Systems    Virtual Visit via Telephone Note  I connected with  Patrick [name] on 09/27/22 at  2:00 PM EDT by telephone and verified that I am speaking with the correct person using two identifiers.  Location: Patient: Home Provider: Office Persons participating in the virtual visit: patient/Nurse Health Advisor   I discussed the limitations, risks, security and privacy concerns of performing an evaluation and management service by telephone and the availability of in person appointments. The patient expressed understanding and agreed to proceed.  Interactive audio and video telecommunications were attempted between this nurse and patient, however failed, due to patient having technical difficulties OR patient did not have access to video capability.  We continued and completed visit with audio only.  Some vital signs may be absent or patient reported.   Tillie Rung, LPN  Cardiac Risk Factors include: advanced age (>32men, >31 women);male gender;hypertension     Objective:    Today's Vitals   09/27/22 1404  Weight: 134 lb (60.8 kg)  Height: 5\' 11"  (1.803 m)   Body mass index is 18.69 kg/m.     09/27/2022    2:14 PM 07/23/2022    4:47 PM 07/02/2022    7:17 AM 06/08/2022   12:00 AM 06/07/2022    8:47 AM 06/04/2022    7:41 AM 05/17/2022   10:24 AM  Advanced Directives  Does Patient Have a Medical Advance Directive? Yes No No  No No Yes  Type of Estate agent of [place];Living will      Healthcare Power of [place];Living will  Copy of Healthcare Power of Attorney in Chart? No - copy requested      No - copy requested  Would patient like information on creating a medical advance directive?  No - Patient declined  No - Patient declined  No - Patient declined     Current Medications (verified) Outpatient Encounter  Medications as of 09/27/2022  Medication Sig   acetaminophen (TYLENOL) 500 MG tablet Take 1,500 mg by mouth 3 (three) times daily as needed for mild pain, moderate pain, fever or headache.   allopurinol (ZYLOPRIM) 100 MG tablet TAKE 1 TABLET BY MOUTH EVERY DAY   AURYXIA 1 GM 210 MG(Fe) tablet Take 210-420 mg by mouth See admin instructions. 420 mg with every meal, 210 mg with each snack.   cinacalcet (SENSIPAR) 30 MG tablet Take 1 tablet (30 mg total) by mouth Every Tuesday,Thursday,and Saturday with dialysis.   diphenhydramine-acetaminophen (TYLENOL PM) 25-500 MG TABS tablet Take 2 tablets by mouth at bedtime as needed (sleep/pain).   folic acid (FOLVITE) 1 MG tablet Take 1 tablet (1 mg total) by mouth daily.   hydroxyurea (HYDREA) 500 MG capsule Take 1 capsule (500 mg total) by mouth daily.   Iron-Vitamins (GERITOL PO) Take 15 mLs by mouth daily.   lidocaine-prilocaine (EMLA) cream Apply 1 application. topically Every Tuesday,Thursday,and Saturday with dialysis.   Oxycodone HCl 10 MG TABS Take 1 tablet (10 mg total) by mouth every 6 (six) hours as needed.   pantoprazole (PROTONIX) 40 MG tablet Take 1 tablet (40 mg total) by mouth daily.   tiZANidine (ZANAFLEX) 4 MG tablet TAKE 1/2 TABLET (2MG  TOTAL) BY MOUTH EVERY 8 HOURS AS NEEDED FOR MUSCLE SPASM   Torsemide 40 MG TABS Take 40 mg by mouth in the morning and at bedtime.   Vitamin  D, Ergocalciferol, (DRISDOL) 1.25 MG (50000 UNIT) CAPS capsule Take 1 capsule (50,000 Units total) by mouth once a week. Monday (Patient not taking: Reported on 08/01/2022)   No facility-administered encounter medications on file as of 09/27/2022.    Allergies (verified) Patient has no known allergies.   History: Past Medical History:  Diagnosis Date   A-fib    Blood dyscrasia    Blood transfusion    "I've had a bunch of them"   CHF (congestive heart failure)    followed by hf clinic   Chronic kidney disease    Stage 5, dialysis on Tu/Th/Sa   Dialysis  patient 04/2019   AV fistula (right arm), TTS   Dysrhythmia    A-Fib per patient   Early satiety    Elevated LFTs 04/01/2022   Elevated troponin 04/01/2022   Empyema of gallbladder 05/27/2021   GERD (gastroesophageal reflux disease)    Gout    Legally blind    Metabolic acidosis with increased anion gap and accumulation of organic acids 04/01/2022   Mitral regurgitation    Muscle tightness-Right arm  05/27/2014   Pneumonia    Pulmonary hypertension    Shortness of breath dyspnea    Sickle cell disease, type SS    Swelling abdomen    Swelling of extremity, left    Swelling of extremity, right    Tricuspid regurgitation    Past Surgical History:  Procedure Laterality Date   A/V FISTULAGRAM Right 07/29/2020   Procedure: A/V FISTULAGRAM - Right Arm;  Surgeon: Chuck Hint, MD;  Location: [facility] INVASIVE CV LAB;  Service: Cardiovascular;  Laterality: Right;   BASCILIC VEIN TRANSPOSITION Right 04/12/2014   Procedure: BASILIC VEIN TRANSPOSITION;  Surgeon: Sherren Kerns, MD;  Location: [facility] OR;  Service: Vascular;  Laterality: Right;   BILIARY STENT PLACEMENT  04/04/2022   Procedure: BILIARY STENT PLACEMENT;  Surgeon: Meryl Dare, MD;  Location: [facility] ENDOSCOPY;  Service: Gastroenterology;;   BIOPSY  08/04/2020   Procedure: BIOPSY;  Surgeon: Meryl Dare, MD;  Location: [facility] ENDOSCOPY;  Service: Gastroenterology;;   BIOPSY  06/28/2021   Procedure: BIOPSY;  Surgeon: Beverley Fiedler, MD;  Location: [facility] ENDOSCOPY;  Service: Gastroenterology;;   BIOPSY  04/02/2022   Procedure: BIOPSY;  Surgeon: Lemar Lofty., MD;  Location: [facility] ENDOSCOPY;  Service: Gastroenterology;;   BOWEL RESECTION N/A 05/24/2021   Procedure: SMALL BOWEL RESECTION;  Surgeon: Emelia Loron, MD;  Location: [facility] OR;  Service: General;  Laterality: N/A;   CARDIOVERSION N/A 06/05/2018   Procedure: CARDIOVERSION;  Surgeon: Dolores Patty, MD;  Location: [facility] ENDOSCOPY;  Service: Cardiovascular;   Laterality: N/A;   CARDIOVERSION N/A 10/06/2019   Procedure: CARDIOVERSION;  Surgeon: Dolores Patty, MD;  Location: [facility] ENDOSCOPY;  Service: Cardiovascular;  Laterality: N/A;   CHOLECYSTECTOMY N/A 05/24/2021   Procedure: OPEN CHOLECYSTECTOMY;  Surgeon: Emelia Loron, MD;  Location: [facility] OR;  Service: General;  Laterality: N/A;   COLONOSCOPY WITH PROPOFOL N/A 09/24/2017   Procedure: COLONOSCOPY WITH PROPOFOL;  Surgeon: Lynann Bologna, MD;  Location: [facility] ENDOSCOPY;  Service: Endoscopy;  Laterality: N/A;   ENDOSCOPIC RETROGRADE CHOLANGIOPANCREATOGRAPHY (ERCP) WITH PROPOFOL N/A 05/17/2022   Procedure: ENDOSCOPIC RETROGRADE CHOLANGIOPANCREATOGRAPHY (ERCP) WITH PROPOFOL;  Surgeon: Meridee Score Netty Starring., MD;  Location: [facility] ENDOSCOPY;  Service: Gastroenterology;  Laterality: N/A;   ERCP N/A 04/04/2022   Procedure: ENDOSCOPIC RETROGRADE CHOLANGIOPANCREATOGRAPHY (ERCP);  Surgeon: Meryl Dare, MD;  Location: [facility] ENDOSCOPY;  Service: Gastroenterology;  Laterality: N/A;   ESOPHAGOGASTRODUODENOSCOPY N/A 09/19/2017  Procedure: ESOPHAGOGASTRODUODENOSCOPY (EGD);  Surgeon: Lynann Bologna, MD;  Location: [facility] ENDOSCOPY;  Service: Endoscopy;  Laterality: N/A;   ESOPHAGOGASTRODUODENOSCOPY N/A 05/29/2021   Procedure: ESOPHAGOGASTRODUODENOSCOPY (EGD);  Surgeon: Rachael Fee, MD;  Location: [facility] ENDOSCOPY;  Service: Gastroenterology;  Laterality: N/A;   ESOPHAGOGASTRODUODENOSCOPY (EGD) WITH PROPOFOL N/A 08/04/2020   Procedure: ESOPHAGOGASTRODUODENOSCOPY (EGD) WITH PROPOFOL;  Surgeon: Meryl Dare, MD;  Location: [facility] ENDOSCOPY;  Service: Gastroenterology;  Laterality: N/A;   ESOPHAGOGASTRODUODENOSCOPY (EGD) WITH PROPOFOL N/A 06/28/2021   Procedure: ESOPHAGOGASTRODUODENOSCOPY (EGD) WITH PROPOFOL;  Surgeon: Beverley Fiedler, MD;  Location: [facility] ENDOSCOPY;  Service: Gastroenterology;  Laterality: N/A;   ESOPHAGOGASTRODUODENOSCOPY (EGD) WITH PROPOFOL N/A 08/10/2021   Procedure: ESOPHAGOGASTRODUODENOSCOPY (EGD) WITH  PROPOFOL;  Surgeon: Lynann Bologna, MD;  Location: [facility] ENDOSCOPY;  Service: Gastroenterology;  Laterality: N/A;   ESOPHAGOGASTRODUODENOSCOPY (EGD) WITH PROPOFOL N/A 08/02/2021   Procedure: ESOPHAGOGASTRODUODENOSCOPY (EGD) WITH PROPOFOL;  Surgeon: Shellia Cleverly, DO;  Location: [facility] ENDOSCOPY;  Service: Gastroenterology;  Laterality: N/A;   ESOPHAGOGASTRODUODENOSCOPY (EGD) WITH PROPOFOL N/A 04/02/2022   Procedure: ESOPHAGOGASTRODUODENOSCOPY (EGD) WITH PROPOFOL;  Surgeon: Meridee Score Netty Starring., MD;  Location: [facility] ENDOSCOPY;  Service: Gastroenterology;  Laterality: N/A;   ESOPHAGOGASTRODUODENOSCOPY (EGD) WITH PROPOFOL N/A 05/17/2022   Procedure: ESOPHAGOGASTRODUODENOSCOPY (EGD) WITH PROPOFOL;  Surgeon: Meridee Score Netty Starring., MD;  Location: [facility] ENDOSCOPY;  Service: Gastroenterology;  Laterality: N/A;   EUS N/A 05/17/2022   Procedure: UPPER ENDOSCOPIC ULTRASOUND (EUS) RADIAL;  Surgeon: Lemar Lofty., MD;  Location: [facility] ENDOSCOPY;  Service: Gastroenterology;  Laterality: N/A;   FINE NEEDLE ASPIRATION  04/02/2022   Procedure: FINE NEEDLE ASPIRATION (FNA) LINEAR;  Surgeon: Lemar Lofty., MD;  Location: [facility] ENDOSCOPY;  Service: Gastroenterology;;   FINE NEEDLE ASPIRATION N/A 05/17/2022   Procedure: FINE NEEDLE ASPIRATION (FNA) LINEAR;  Surgeon: Lemar Lofty., MD;  Location: [facility] ENDOSCOPY;  Service: Gastroenterology;  Laterality: N/A;   HEMOSTASIS CLIP PLACEMENT  05/29/2021   Procedure: HEMOSTASIS CLIP PLACEMENT;  Surgeon: Rachael Fee, MD;  Location: [facility] ENDOSCOPY;  Service: Gastroenterology;;   HOT HEMOSTASIS N/A 05/29/2021   Procedure: HOT HEMOSTASIS (ARGON PLASMA COAGULATION/BICAP);  Surgeon: Rachael Fee, MD;  Location: [facility] ENDOSCOPY;  Service: Gastroenterology;  Laterality: N/A;   HOT HEMOSTASIS N/A 06/28/2021   Procedure: HOT HEMOSTASIS (ARGON PLASMA COAGULATION/BICAP);  Surgeon: Beverley Fiedler, MD;  Location: [facility] ENDOSCOPY;  Service: Gastroenterology;  Laterality:  N/A;   HOT HEMOSTASIS N/A 08/10/2021   Procedure: HOT HEMOSTASIS (ARGON PLASMA COAGULATION/BICAP);  Surgeon: Lynann Bologna, MD;  Location: [facility] ENDOSCOPY;  Service: Gastroenterology;  Laterality: N/A;   HOT HEMOSTASIS N/A 08/02/2021   Procedure: HOT HEMOSTASIS (ARGON PLASMA COAGULATION/BICAP);  Surgeon: Shellia Cleverly, DO;  Location: [facility] ENDOSCOPY;  Service: Gastroenterology;  Laterality: N/A;   IR PARACENTESIS  07/04/2022   IR PERC TUN PERIT CATH WO PORT S&I /IMAG  05/23/2021   IR REMOVAL TUN CV CATH W/O FL  06/10/2021   IR US GUIDE VASC ACCESS RIGHT  05/23/2021   LAPAROSCOPY N/A 05/24/2021   Procedure: LAPAROSCOPY DIAGNOSTIC;  Surgeon: Emelia Loron, MD;  Location: [facility] OR;  Service: General;  Laterality: N/A;   LAPAROTOMY N/A 05/24/2021   Procedure: EXPLORATORY LAPAROTOMY;  Surgeon: Emelia Loron, MD;  Location: [facility] OR;  Service: General;  Laterality: N/A;   LEFT AND RIGHT HEART CATHETERIZATION WITH CORONARY ANGIOGRAM N/A 06/11/2011   Procedure: LEFT AND RIGHT HEART CATHETERIZATION WITH CORONARY ANGIOGRAM;  Surgeon: Laurey Morale, MD;  Location: [facility] CATH LAB;  Service: Cardiovascular;  Laterality: N/A;   PERIPHERAL VASCULAR BALLOON ANGIOPLASTY  Right 07/29/2020   Procedure: PERIPHERAL VASCULAR BALLOON ANGIOPLASTY;  Surgeon: Chuck Hint, MD;  Location: [facility] INVASIVE CV LAB;  Service: Cardiovascular;  Laterality: Right;  arm fistula   REMOVAL OF STONES  05/17/2022   Procedure: REMOVAL OF STONES;  Surgeon: Meridee Score Netty Starring., MD;  Location: [facility] ENDOSCOPY;  Service: Gastroenterology;;   REVISON OF ARTERIOVENOUS FISTULA Right 08/08/2020   Procedure: ANEURYSM EXCISION OF RIGHT UPPER EXTREMITY ARTERIOVENOUS FISTULA;  Surgeon: Chuck Hint, MD;  Location: [facility] OR;  Service: Vascular;  Laterality: Right;   REVISON OF ARTERIOVENOUS FISTULA Right 03/21/2022   Procedure: EXCISION OF ULCERATED SKIN OF RIGHT ARTERIOVENOUS FISTULA REVISION;  Surgeon: Cephus Shelling, MD;   Location: [facility] OR;  Service: Vascular;  Laterality: Right;   REVISON OF ARTERIOVENOUS FISTULA Right 07/02/2022   Procedure: EXCISION OF ULCERS RIGHT ARTERIOVENOUS FISTULA;  Surgeon: Chuck Hint, MD;  Location: [facility] OR;  Service: Vascular;  Laterality: Right;   SCLEROTHERAPY  05/29/2021   Procedure: Susa Day;  Surgeon: Rachael Fee, MD;  Location: [facility] ENDOSCOPY;  Service: Gastroenterology;;   Dennison Mascot  04/04/2022   Procedure: SPHINCTEROTOMY;  Surgeon: Meryl Dare, MD;  Location: [facility] ENDOSCOPY;  Service: Gastroenterology;;   Francine Graven REMOVAL  05/29/2021   Procedure: STENT REMOVAL;  Surgeon: Rachael Fee, MD;  Location: [facility] ENDOSCOPY;  Service: Gastroenterology;;   Francine Graven REMOVAL  05/17/2022   Procedure: STENT REMOVAL;  Surgeon: Lemar Lofty., MD;  Location: [facility] ENDOSCOPY;  Service: Gastroenterology;;   TEE WITHOUT CARDIOVERSION N/A 12/27/2015   Procedure: TRANSESOPHAGEAL ECHOCARDIOGRAM (TEE);  Surgeon: Quintella Reichert, MD;  Location: [facility] ENDOSCOPY;  Service: Cardiovascular;  Laterality: N/A;   UPPER ESOPHAGEAL ENDOSCOPIC ULTRASOUND (EUS) N/A 04/02/2022   Procedure: UPPER ESOPHAGEAL ENDOSCOPIC ULTRASOUND (EUS);  Surgeon: Lemar Lofty., MD;  Location: [facility] ENDOSCOPY;  Service: Gastroenterology;  Laterality: N/A;   Family History  Problem Relation Age of Onset   Alcohol abuse Mother    Liver disease Mother    Cirrhosis Mother    Heart attack Mother    Social History   Socioeconomic History   Marital status: Divorced    Spouse name: Not on file   Number of children: Not on file   Years of education: Not on file   Highest education level: Not on file  Occupational History   Occupation: disabled  Tobacco Use   Smoking status: Former    Packs/day: 0.50    Years: 41.00    Additional pack years: 0.00    Total pack years: 20.50    Types: Cigarettes    Quit date: 12/08/2015    Years since quitting: 6.8   Smokeless tobacco: Never  Vaping Use   Vaping  Use: Never used  Substance and Sexual Activity   Alcohol use: Not Currently    Alcohol/week: 3.0 standard drinks of alcohol    Types: 1 Glasses of wine, 2 Cans of beer per week    Comment: none since starting dialysis 06/2021   Drug use: No   Sexual activity: Not Currently  Other Topics Concern   Not on file  Social History Narrative   Not on file   Social Determinants of Health   Financial Resource Strain: Low Risk  (09/27/2022)   Overall Financial Resource Strain (CARDIA)    Difficulty of Paying Living Expenses: Not hard at all  Food Insecurity: No Food Insecurity (09/27/2022)   Hunger Vital Sign    Worried About Running Out of Food in the Last Year: Never true  Ran Out of Food in the Last Year: Never true  Transportation Needs: No Transportation Needs (09/27/2022)   PRAPARE - Administrator, Civil ServiceTransportation    Lack of Transportation (Medical): No    Lack of Transportation (Non-Medical): No  Physical Activity: Inactive (09/27/2022)   Exercise Vital Sign    Days of Exercise per Week: 0 days    Minutes of Exercise per Session: 0 min  Stress: No Stress Concern Present (09/27/2022)   [employer] - Occupational Stress Questionnaire    Feeling of Stress : Not at all  Social Connections: Socially Isolated (09/27/2022)   Social Connection and Isolation Panel [NHANES]    Frequency of Communication with Friends and Family: More than three times a week    Frequency of Social Gatherings with Friends and Family: More than three times a week    Attends Religious Services: Never    Database administratorActive Member of Clubs or Organizations: No    Attends Engineer, structuralClub or Organization Meetings: Never    Marital Status: Divorced    Tobacco Counseling Counseling given: Not Answered   Clinical Intake:  Pre-visit preparation completed: No  Pain : No/denies pain     BMI - recorded: 18.69 Nutritional Status: BMI <19  Underweight Nutritional Risks: None Diabetes: No  How often do you need to have  someone help you when you read instructions, pamphlets, or other written materials from your doctor or pharmacy?: 1 - Never (Legally Blind)  Diabetic?  No  Interpreter Needed?: No  Information entered by :: Theresa MulliganBeverly Anas Reister LPN   Activities of Daily Living    09/27/2022    2:12 PM 07/24/2022    5:00 AM  In your present state of health, do you have any difficulty performing the following activities:  Hearing? 0 0  Vision? 1 1  Comment Legally blind   Difficulty concentrating or making decisions? 0 0  Walking or climbing stairs? 0 0  Dressing or bathing? 0 1  Doing errands, shopping? 0 0  Preparing Food and eating ? N   Using the Toilet? N   In the past six months, have you accidently leaked urine? N   Comment Dialysis patient   Do you have problems with loss of bowel control? N   Managing your Medications? N   Managing your Finances? N   Housekeeping or managing your Housekeeping? N     Patient Care Team: Quentin AngstJegede, Olugbemiga E, MD as PCP - General (Internal Medicine) Luciana Axeankin, Alford HighlandGary A, MD as Consulting Physician (Ophthalmology) Camille Balunham, [name], MD as Consulting Physician (Nephrology) [facility]  Indicate any recent Medical Services you may have received from other than [facility] providers in the past year (date may be approximate).     Assessment:   This is a routine wellness examination for Patrick [name].  Hearing/Vision screen Hearing Screening - Comments:: Denies hearing difficulties   Vision Screening - Comments:: Legally Blind  Dietary issues and exercise activities discussed: Current Exercise Habits: The patient does not participate in regular exercise at present, Exercise limited by: None identified   Goals Addressed               This Visit's Progress     No Current goal (pt-stated)         Depression Screen    09/27/2022    2:11 PM 08/01/2022    1:27 PM 04/17/2022    1:27 PM 11/28/2021    9:07 AM 09/28/2021   10:40 AM 09/04/2021   11:39 AM  02/28/2021  11:39 AM  PHQ 2/9 Scores  PHQ - 2 Score 0 0 0 0 0 0 0  PHQ- 9 Score   2  0 3     Fall Risk    09/27/2022    2:14 PM 04/17/2022    1:27 PM 11/28/2021    9:07 AM 09/28/2021   10:40 AM 09/04/2021   11:39 AM  Fall Risk   Falls in the past year? 0 0 0 0 0  Number falls in past yr: 0 0 0 0 0  Injury with Fall? 0 0 0 0 0  Risk for fall due to : No Fall Risks No Fall Risks No Fall Risks No Fall Risks No Fall Risks  Follow up Falls prevention discussed  Falls evaluation completed Falls evaluation completed Falls evaluation completed    FALL RISK PREVENTION PERTAINING TO THE HOME:  Any stairs in or around the home? Yes  If so, are there any without handrails? No  Home free of loose throw rugs in walkways, pet beds, electrical cords, etc? Yes  Adequate lighting in your home to reduce risk of falls? Yes   ASSISTIVE DEVICES UTILIZED TO PREVENT FALLS:  Life alert? No  Use of a cane, walker or w/c? Yes  Grab bars in the bathroom? No  Shower chair or bench in shower? No  Elevated toilet seat or a handicapped toilet? No   TIMED UP AND GO:  Was the test performed? No . Audio Visit   Cognitive Function:    04/30/2018    3:57 PM  MMSE - Mini Mental State Exam  Orientation to time 5  Orientation to Place 5  Registration 3  Attention/ Calculation 4  Recall 3  Language- name 2 objects 2  Language- repeat 1  Language- follow 3 step command 3  Language- read & follow direction 0  Write a sentence 1  Copy design 0  Total score 27        09/27/2022    2:15 PM  6CIT Screen  What Year? 0 points  What month? 0 points  What time? 0 points  Count back from 20 0 points  Months in reverse 0 points  Repeat phrase 0 points  Total Score 0 points    Immunizations Immunization History  Administered Date(s) Administered   Influenza Inj Mdck Quad Pf 03/21/2018   Influenza,inj,Quad PF,6+ Mos 03/17/2013, 03/08/2014   PFIZER(Purple Top)SARS-COV-2 Vaccination 10/01/2019,  10/26/2019   Pneumococcal Conjugate-13 12/02/2014   Pneumococcal Polysaccharide-23 02/10/2013, 02/28/2021   Tdap 09/24/2013   Zoster Recombinat (Shingrix) 11/03/2020, 01/11/2021    TDAP status: Up to date  Flu Vaccine status: Up to date    Covid-19 vaccine status: Completed vaccines  Qualifies for Shingles Vaccine? Yes   Zostavax completed Yes   Shingrix Completed?: Yes  Screening Tests Health Maintenance  Topic Date Due   COVID-19 Vaccine (3 - Pfizer risk series) 10/13/2022 (Originally 11/23/2019)   Lung Cancer Screening  04/01/2023   DTaP/Tdap/Td (2 - Td or Tdap) 09/25/2023   Medicare Annual Wellness (AWV)  09/27/2023   COLONOSCOPY (Pts 45-54yrs Insurance coverage will need to be confirmed)  09/25/2027   Hepatitis C Screening  Completed   HIV Screening  Completed   Zoster Vaccines- Shingrix  Completed   HPV VACCINES  Aged Out    Health Maintenance  There are no preventive care reminders to display for this patient.   Colorectal cancer screening: Type of screening: Colonoscopy. Completed 09/24/17. Repeat every 10 years  Lung Cancer Screening: (  Low Dose CT Chest recommended if Age 91-80 years, 30 pack-year currently smoking OR have quit w/in 15years.) does qualify.   Lung Cancer Screening Referral: Ordered on 06/10/22  Additional Screening:  Hepatitis C Screening: does qualify; Completed 04/02/22  Vision Screening: Recommended annual ophthalmology exams for early detection of glaucoma and other disorders of the eye. Is the patient up to date with their annual eye exam?  No Who is the provider or what is the name of the office in which the patient attends annual eye exams? Legally Blind If pt is not established with a provider, would they like to be referred to a provider to establish care? No .   Dental Screening: Recommended annual dental exams for proper oral hygiene  Community Resource Referral / Chronic Care Management:  CRR required this visit?  No   CCM  required this visit?  No      Plan:     I have personally reviewed and noted the following in the patient's chart:   Medical and social history Use of alcohol, tobacco or illicit drugs  Current medications and supplements including opioid prescriptions. Patient is not currently taking opioid prescriptions. Functional ability and status Nutritional status Physical activity Advanced directives List of other physicians Hospitalizations, surgeries, and ER visits in previous 12 months Vitals Screenings to include cognitive, depression, and falls Referrals and appointments  In addition, I have reviewed and discussed with patient certain preventive protocols, quality metrics, and best practice recommendations. A written personalized care plan for preventive services as well as general preventive health recommendations were provided to patient.     Tillie Rung, LPN   [id number]   Nurse Notes: None

## 2022-09-27 NOTE — Patient Instructions (Addendum)
Patrick [name] , Thank you for taking time to come for your Medicare Wellness Visit. I appreciate your ongoing commitment to your health goals. Please review the following plan we discussed and let me know if I can assist you in the future.   These are the goals we discussed:  Goals       assist with health management      Interventions Today    Flowsheet Row Most Recent Value  Chronic Disease   Chronic disease during today's visit Chronic Kidney Disease/End Stage Renal Disease (ESRD), Atrial Fibrillation (AFib)  General Interventions   General Interventions Discussed/Reviewed General Interventions Discussed, Doctor Visits  [evaluation of current treatment plan related to recent hospitalization and patient's adherence to plan as established by provider. provided RNCM contact number and encouraged to call as needed with care coordination needs.]  Doctor Visits Discussed/Reviewed Doctor Visits Discussed, Annual Wellness Visits, PCP  [reviewed instructions per office visit with PCP on 08/01/22. patient due for AWV-contact number provided and RNCM encouraged to call to schedule.]  PCP/Specialist Visits Compliance with follow-up visit  Education Interventions   Education Provided Provided Education  Provided Verbal Education On [facility]  [provided contact number for [facility] [phone].]  Pharmacy Interventions   Pharmacy Dicussed/Reviewed Pharmacy Topics Discussed, Medications and their functions, Medication Adherence, Affording Medications  [medication discrepancy noted per AVS, Epic and patient-PCP notified regarding the need for clarification.]            No Current goal (pt-stated)        This is a list of the screening recommended for you and due dates:  Health Maintenance  Topic Date Due   COVID-19 Vaccine (3 - Pfizer risk series) 10/13/2022*   Screening for Lung Cancer  04/01/2023   DTaP/Tdap/Td vaccine (2 - Td or Tdap) 09/25/2023   Medicare Annual  Wellness Visit  09/27/2023   Colon Cancer Screening  09/25/2027   Hepatitis C Screening: USPSTF Recommendation to screen - Ages 18-79 yo.  Completed   HIV Screening  Completed   Zoster (Shingles) Vaccine  Completed   HPV Vaccine  Aged Out  *Topic was postponed. The date shown is not the original due date.    Advanced directives: Please bring a copy of your health care power of attorney and living will to the office to be added to your chart at your convenience.   Conditions/risks identified: None  Next appointment: Follow up in one year for your annual wellness visit   Preventive Care 40-64 Years, Male Preventive care refers to lifestyle choices and visits with your health care provider that can promote health and wellness. What does preventive care include? A yearly physical exam. This is also called an annual well check. Dental exams once or twice a year. Routine eye exams. Ask your health care provider how often you should have your eyes checked. Personal lifestyle choices, including: Daily care of your teeth and gums. Regular physical activity. Eating a healthy diet. Avoiding tobacco and drug use. Limiting alcohol use. Practicing safe sex. Taking low-dose aspirin every day starting at age 65. What happens during an annual well check? The services and screenings done by your health care provider during your annual well check will depend on your age, overall health, lifestyle risk factors, and family history of disease. Counseling  Your health care provider may ask you questions about your: Alcohol use. Tobacco use. Drug use. Emotional well-being. Home and relationship well-being. Sexual activity. Eating habits. Work and work [employer].  Screening  You may have the following tests or measurements: Height, weight, and BMI. Blood pressure. Lipid and cholesterol levels. These may be checked every 5 years, or more frequently if you are over 64 years old. Skin check. Lung  cancer screening. You may have this screening every year starting at age 42 if you have a 30-pack-year history of smoking and currently smoke or have quit within the past 15 years. Fecal occult blood test (FOBT) of the stool. You may have this test every year starting at age 60. Flexible sigmoidoscopy or colonoscopy. You may have a sigmoidoscopy every 5 years or a colonoscopy every 10 years starting at age 24. Prostate cancer screening. Recommendations will vary depending on your family history and other risks. Hepatitis C blood test. Hepatitis B blood test. Sexually transmitted disease (STD) testing. Diabetes screening. This is done by checking your blood sugar (glucose) after you have not eaten for a while (fasting). You may have this done every 1-3 years. Discuss your test results, treatment options, and if necessary, the need for more tests with your health care provider. Vaccines  Your health care provider may recommend certain vaccines, such as: Influenza vaccine. This is recommended every year. Tetanus, diphtheria, and acellular pertussis (Tdap, Td) vaccine. You may need a Td booster every 10 years. Zoster vaccine. You may need this after age 56. Pneumococcal 13-valent conjugate (PCV13) vaccine. You may need this if you have certain conditions and have not been vaccinated. Pneumococcal polysaccharide (PPSV23) vaccine. You may need one or two doses if you smoke cigarettes or if you have certain conditions. Talk to your health care provider about which screenings and vaccines you need and how often you need them. This information is not intended to replace advice given to you by your health care provider. Make sure you discuss any questions you have with your health care provider. Document Released: 07/01/2015 Document Revised: 02/22/2016 Document Reviewed: 04/05/2015 Elsevier Interactive Patient Education  2017 [employer].  Fall Prevention in the Home Falls can cause injuries. They can  happen to people of all ages. There are many things you can do to make your home safe and to help prevent falls. What can I do on the outside of my home? Regularly fix the edges of walkways and driveways and fix any cracks. Remove anything that might make you trip as you walk through a door, such as a raised step or threshold. Trim any bushes or trees on the path to your home. Use bright outdoor lighting. Clear any walking paths of anything that might make someone trip, such as rocks or tools. Regularly check to see if handrails are loose or broken. Make sure that both sides of any steps have handrails. Any raised decks and porches should have guardrails on the edges. Have any leaves, snow, or ice cleared regularly. Use sand or salt on walking paths during winter. Clean up any spills in your garage right away. This includes oil or grease spills. What can I do in the bathroom? Use night lights. Install grab bars by the toilet and in the tub and shower. Do not use towel bars as grab bars. Use non-skid mats or decals in the tub or shower. If you need to sit down in the shower, use a plastic, non-slip stool. Keep the floor dry. Clean up any water that spills on the floor as soon as it happens. Remove soap buildup in the tub or shower regularly. Attach bath mats securely with double-sided non-slip rug tape.  Do not have throw rugs and other things on the floor that can make you trip. What can I do in the bedroom? Use night lights. Make sure that you have a light by your bed that is easy to reach. Do not use any sheets or blankets that are too big for your bed. They should not hang down onto the floor. Have a firm chair that has side arms. You can use this for support while you get dressed. Do not have throw rugs and other things on the floor that can make you trip. What can I do in the kitchen? Clean up any spills right away. Avoid walking on wet floors. Keep items that you use a lot in  easy-to-reach places. If you need to reach something above you, use a strong step stool that has a grab bar. Keep electrical cords out of the way. Do not use floor polish or wax that makes floors slippery. If you must use wax, use non-skid floor wax. Do not have throw rugs and other things on the floor that can make you trip. What can I do with my stairs? Do not leave any items on the stairs. Make sure that there are handrails on both sides of the stairs and use them. Fix handrails that are broken or loose. Make sure that handrails are as long as the stairways. Check any carpeting to make sure that it is firmly attached to the stairs. Fix any carpet that is loose or worn. Avoid having throw rugs at the top or bottom of the stairs. If you do have throw rugs, attach them to the floor with carpet tape. Make sure that you have a light switch at the top of the stairs and the bottom of the stairs. If you do not have them, ask someone to add them for you. What else can I do to help prevent falls? Wear shoes that: Do not have high heels. Have rubber bottoms. Are comfortable and fit you well. Are closed at the toe. Do not wear sandals. If you use a stepladder: Make sure that it is fully opened. Do not climb a closed stepladder. Make sure that both sides of the stepladder are locked into place. Ask someone to hold it for you, if possible. Clearly mark and make sure that you can see: Any grab bars or handrails. First and last steps. Where the edge of each step is. Use tools that help you move around (mobility aids) if they are needed. These include: Canes. Walkers. Scooters. Crutches. Turn on the lights when you go into a dark area. Replace any light bulbs as soon as they burn out. Set up your furniture so you have a clear path. Avoid moving your furniture around. If any of your floors are uneven, fix them. If there are any pets around you, be aware of where they are. Review your medicines  with your doctor. Some medicines can make you feel dizzy. This can increase your chance of falling. Ask your doctor what other things that you can do to help prevent falls. This information is not intended to replace advice given to you by your health care provider. Make sure you discuss any questions you have with your health care provider. Document Released: 03/31/2009 Document Revised: 11/10/2015 Document Reviewed: 07/09/2014 Elsevier Interactive Patient Education  2017 [employer].

## 2022-10-02 ENCOUNTER — Ambulatory Visit (INDEPENDENT_AMBULATORY_CARE_PROVIDER_SITE_OTHER): Payer: PPO | Admitting: Nurse Practitioner

## 2022-10-02 ENCOUNTER — Encounter: Payer: Self-pay | Admitting: Nurse Practitioner

## 2022-10-02 VITALS — BP 92/53 | HR 79 | Ht 71.0 in | Wt 136.8 lb

## 2022-10-02 DIAGNOSIS — I5022 Chronic systolic (congestive) heart failure: Secondary | ICD-10-CM | POA: Diagnosis not present

## 2022-10-02 DIAGNOSIS — T148XXA Other injury of unspecified body region, initial encounter: Secondary | ICD-10-CM

## 2022-10-02 DIAGNOSIS — H548 Legal blindness, as defined in USA: Secondary | ICD-10-CM

## 2022-10-02 DIAGNOSIS — N2581 Secondary hyperparathyroidism of renal origin: Secondary | ICD-10-CM | POA: Diagnosis not present

## 2022-10-02 DIAGNOSIS — D57219 Sickle-cell/Hb-C disease with crisis, unspecified: Secondary | ICD-10-CM | POA: Diagnosis not present

## 2022-10-02 DIAGNOSIS — D509 Iron deficiency anemia, unspecified: Secondary | ICD-10-CM | POA: Diagnosis not present

## 2022-10-02 DIAGNOSIS — D571 Sickle-cell disease without crisis: Secondary | ICD-10-CM | POA: Diagnosis not present

## 2022-10-02 DIAGNOSIS — L089 Local infection of the skin and subcutaneous tissue, unspecified: Secondary | ICD-10-CM | POA: Diagnosis not present

## 2022-10-02 DIAGNOSIS — D631 Anemia in chronic kidney disease: Secondary | ICD-10-CM | POA: Diagnosis not present

## 2022-10-02 DIAGNOSIS — D57819 Other sickle-cell disorders with crisis, unspecified: Secondary | ICD-10-CM | POA: Diagnosis not present

## 2022-10-02 DIAGNOSIS — Z992 Dependence on renal dialysis: Secondary | ICD-10-CM | POA: Diagnosis not present

## 2022-10-02 DIAGNOSIS — N186 End stage renal disease: Secondary | ICD-10-CM | POA: Diagnosis not present

## 2022-10-02 DIAGNOSIS — D5700 Hb-SS disease with crisis, unspecified: Secondary | ICD-10-CM | POA: Diagnosis not present

## 2022-10-02 MED ORDER — DOXYCYCLINE HYCLATE 100 MG PO TABS: 100.0000 mg | ORAL_TABLET | Freq: Two times a day (BID) | ORAL | 0 refills | Status: AC

## 2022-10-02 NOTE — Patient Instructions (Addendum)
1. Impaired vision in both eyes  - Ambulatory referral to Ophthalmology  2. Wound infection  - doxycycline (VIBRA-TABS) 100 MG tablet; Take 1 tablet (100 mg total) by mouth 2 (two) times daily for 7 days.  Dispense: 14 tablet; Refill: 0 - Ambulatory referral to Podiatry    Thanks for choosing [facility] we consider it a privelige to serve you.

## 2022-10-02 NOTE — Assessment & Plan Note (Addendum)
Left lateral ankle  Doxycycline 100 mg twice daily for 7 days Patient referred to wound care center

## 2022-10-02 NOTE — Progress Notes (Signed)
Acute Office Visit  Subjective:     Patient ID: Patrick [name], male    DOB: Oct 28, 1955, 67 y.o.   MRN: [id number]  Chief Complaint  Patient presents with   Ankle Pain    Ankle sore started two moths ago is getting worse.    HPI Patrick [name] has a past medical history of A-fib, Blood dyscrasia, Blood transfusion, CHF (congestive heart failure), Chronic kidney disease, Dialysis patient (04/2019), Dysrhythmia, Early satiety, Elevated LFTs (04/01/2022), Elevated troponin (04/01/2022), Empyema of gallbladder (05/27/2021), GERD (gastroesophageal reflux disease), Gout, Legally blind, Metabolic acidosis with increased anion gap and accumulation of organic acids (04/01/2022), Mitral regurgitation, Muscle tightness-Right arm  (05/27/2014), Pneumonia, Pulmonary hypertension, Shortness of breath dyspnea, Sickle cell disease, type SS, Swelling abdomen, Swelling of extremity, left, Swelling of extremity, right, and Tricuspid regurgitation.   Patient presents today with complaints of left ankle wound and pain for the past 2 months.  Patient denies trauma, fever, chills, malaise.  Patient is legally blind would like referral to a new ophthalmologist because he has not seen in over a year.     Review of Systems  Constitutional:  Negative for chills, fever, malaise/fatigue and weight loss.  Eyes:  Negative for pain and discharge.  Respiratory:  Negative for cough, hemoptysis, sputum production, shortness of breath and wheezing.   Cardiovascular:  Negative for chest pain, palpitations and orthopnea.  Gastrointestinal: Negative.   Musculoskeletal:  Positive for joint pain.  Neurological: Negative.  Negative for dizziness, tingling, tremors and headaches.  Psychiatric/Behavioral: Negative.  Negative for depression, hallucinations, substance abuse and suicidal ideas.         Objective:    BP (!) 92/53   Pulse 79   Ht 5\' 11"  (1.803 m)   Wt 136 lb 12.8 oz (62.1 kg)   SpO2 100%   BMI 19.08  kg/m    Physical Exam Constitutional:      General: He is not in acute distress.    Appearance: He is not ill-appearing, toxic-appearing or diaphoretic.  Eyes:     General: Scleral icterus present.     Comments: Legally blind  Cardiovascular:     Rate and Rhythm: Normal rate and regular rhythm.     Pulses: Normal pulses.     Heart sounds: Normal heart sounds. No murmur heard.    No friction rub. No gallop.  Pulmonary:     Effort: Pulmonary effort is normal. No respiratory distress.     Breath sounds: Normal breath sounds. No stridor. No wheezing, rhonchi or rales.  Chest:     Chest wall: No tenderness.  Abdominal:     General: There is no distension.     Palpations: Abdomen is soft.     Tenderness: There is no abdominal tenderness. There is no guarding.  Musculoskeletal:     Right lower leg: Edema present.     Left lower leg: Edema present.     Comments: Nonpitting edema noted  Skin:    Findings: No bruising or erythema.     Comments: Wound on left ankle on the lateral side.  No redness or purulent drainage noted.  Site is tender on palpation  Neurological:     Mental Status: He is alert and oriented to person, place, and time.     Motor: No weakness.     Coordination: Coordination normal.     Gait: Gait normal.  Psychiatric:        Mood and Affect: Mood normal.  Behavior: Behavior normal.        Thought Content: Thought content normal.        Judgment: Judgment normal.     No results found for any visits on 10/02/22.      Assessment & Plan:   Problem List Items Addressed This Visit       Cardiovascular and Mediastinum   Chronic systolic CHF (congestive heart failure)    Currently on torsemide 40 mg twice daily Blood pressure is soft in the office today patient reports that this is not new He was encouraged to call cardiology office if his blood pressure remains consistently low. BP Readings from Last 3 Encounters:  10/02/22 (!) 92/53  08/01/22 94/65   07/27/22 97/69           Other   Wound infection - Primary    Left lateral ankle  Doxycycline 100 mg twice daily for 7 days Patient referred to wound care center       Relevant Medications   doxycycline (VIBRA-TABS) 100 MG tablet   Other Relevant Orders   Ambulatory referral to Wound Clinic   Legally blind   Relevant Orders   Ambulatory referral to Ophthalmology    Meds ordered this encounter  Medications   doxycycline (VIBRA-TABS) 100 MG tablet    Sig: Take 1 tablet (100 mg total) by mouth 2 (two) times daily for 7 days.    Dispense:  14 tablet    Refill:  0    Return today (on 10/02/2022).  Patrick Beers, FNP

## 2022-10-02 NOTE — Assessment & Plan Note (Signed)
Currently on torsemide 40 mg twice daily Blood pressure is soft in the office today patient reports that this is not new He was encouraged to call cardiology office if his blood pressure remains consistently low. BP Readings from Last 3 Encounters:  10/02/22 (!) 92/53  08/01/22 94/65  07/27/22 97/69

## 2022-10-04 ENCOUNTER — Telehealth: Payer: Self-pay

## 2022-10-04 NOTE — Progress Notes (Signed)
  Care Coordination Note  10/04/2022 Name: Patrick [name] MRN: [id number] DOB: 07-06-1955  [name] is a 67 y.o. year old male who is a primary care patient of Quentin Angst, MD and is actively engaged with the care management team. I reached out to Ree Shay by phone today to assist with re-scheduling a follow up visit with the RN Case Manager  Follow up plan: Telephone appointment with care management team member scheduled for: 10/05/2022  Burman Nieves, [facility] Care Coordination Care Guide Direct Dial: [phone]

## 2022-10-05 ENCOUNTER — Ambulatory Visit: Payer: Self-pay

## 2022-10-05 NOTE — Patient Outreach (Signed)
  Care Coordination   Follow Up Visit Note   10/05/2022 Name: VISHRUTH SEOANE MRN: [id number] DOB: 19-Mar-1956  Mendy D Bottomley is a 67 y.o. year old male who sees Quentin Angst, MD for primary care. I spoke with  Reyden D Symmonds by phone today.  What matters to the patients health and wellness today?  Primary concern is wound to ankle. Patient reports has started taking antibiotic. He expresses concern if he is able to go back to work and would like clarification. Patient to call to the clinic to request additional directives regarding work.  Goals Addressed             This Visit's Progress    assist with health management       Interventions Today    Flowsheet Row Most Recent Value  Chronic Disease   Chronic disease during today's visit Other  [wound to ankle]  General Interventions   General Interventions Discussed/Reviewed General Interventions Reviewed  Education Interventions   Education Provided Provided Education  Provided Verbal Education On Other  [provided contact number to wound clinic. advised patient to contact provider to get clarification on when he can go back to work and any restrictions as well as recommendation for pain management.]  Pharmacy Interventions   Pharmacy Dicussed/Reviewed Pharmacy Topics Reviewed  [patient denies any questions or concerns regarding medications]            SDOH assessments and interventions completed:  No  Care Coordination Interventions:  Yes, provided   Follow up plan: Follow up call scheduled for 11/01/22    Encounter Outcome:  Pt. Visit Completed   Kathyrn Sheriff, RN, MSN, BSN, CCM [facility] Care Coordinator [phone]

## 2022-10-05 NOTE — Patient Instructions (Signed)
Visit Information  Thank you for taking time to visit with me today. Please don't hesitate to contact me if I can be of assistance to you.   Following are the goals we discussed today:  Contact primary care regarding question concerning work Continue to take medications as prescribed Continue to attend provider visits as scheduled Contact RNCM if care coordination/resource needs   Our next appointment is by telephone on 11/01/22 at 3:30 pm  Please call the care guide team at [phone] if you need to cancel or reschedule your appointment.   If you are experiencing a Mental Health or Behavioral Health Crisis or need someone to talk to, please call the Suicide and Crisis Lifeline: 34  Kathyrn Sheriff, RN, MSN, BSN, CCM [facility] Care Coordinator [phone]

## 2022-10-08 NOTE — Telephone Encounter (Signed)
Letter mail it today. Gh

## 2022-10-09 DIAGNOSIS — Z992 Dependence on renal dialysis: Secondary | ICD-10-CM | POA: Diagnosis not present

## 2022-10-09 DIAGNOSIS — D571 Sickle-cell disease without crisis: Secondary | ICD-10-CM | POA: Diagnosis not present

## 2022-10-09 DIAGNOSIS — D57219 Sickle-cell/Hb-C disease with crisis, unspecified: Secondary | ICD-10-CM | POA: Diagnosis not present

## 2022-10-09 DIAGNOSIS — D631 Anemia in chronic kidney disease: Secondary | ICD-10-CM | POA: Diagnosis not present

## 2022-10-09 DIAGNOSIS — N2581 Secondary hyperparathyroidism of renal origin: Secondary | ICD-10-CM | POA: Diagnosis not present

## 2022-10-09 DIAGNOSIS — N186 End stage renal disease: Secondary | ICD-10-CM | POA: Diagnosis not present

## 2022-10-09 DIAGNOSIS — D5700 Hb-SS disease with crisis, unspecified: Secondary | ICD-10-CM | POA: Diagnosis not present

## 2022-10-09 DIAGNOSIS — D57819 Other sickle-cell disorders with crisis, unspecified: Secondary | ICD-10-CM | POA: Diagnosis not present

## 2022-10-12 ENCOUNTER — Other Ambulatory Visit: Payer: Self-pay | Admitting: Family Medicine

## 2022-10-12 ENCOUNTER — Encounter (HOSPITAL_BASED_OUTPATIENT_CLINIC_OR_DEPARTMENT_OTHER): Payer: PPO | Attending: Internal Medicine | Admitting: Internal Medicine

## 2022-10-12 ENCOUNTER — Telehealth: Payer: Self-pay | Admitting: Internal Medicine

## 2022-10-12 DIAGNOSIS — Z992 Dependence on renal dialysis: Secondary | ICD-10-CM | POA: Diagnosis not present

## 2022-10-12 DIAGNOSIS — D571 Sickle-cell disease without crisis: Secondary | ICD-10-CM | POA: Diagnosis not present

## 2022-10-12 DIAGNOSIS — L97822 Non-pressure chronic ulcer of other part of left lower leg with fat layer exposed: Secondary | ICD-10-CM | POA: Diagnosis not present

## 2022-10-12 DIAGNOSIS — I5022 Chronic systolic (congestive) heart failure: Secondary | ICD-10-CM | POA: Insufficient documentation

## 2022-10-12 DIAGNOSIS — Z87891 Personal history of nicotine dependence: Secondary | ICD-10-CM | POA: Diagnosis not present

## 2022-10-12 DIAGNOSIS — H548 Legal blindness, as defined in USA: Secondary | ICD-10-CM | POA: Insufficient documentation

## 2022-10-12 DIAGNOSIS — N2581 Secondary hyperparathyroidism of renal origin: Secondary | ICD-10-CM | POA: Diagnosis not present

## 2022-10-12 DIAGNOSIS — N186 End stage renal disease: Secondary | ICD-10-CM | POA: Diagnosis not present

## 2022-10-12 DIAGNOSIS — M545 Low back pain, unspecified: Secondary | ICD-10-CM

## 2022-10-12 DIAGNOSIS — Z515 Encounter for palliative care: Secondary | ICD-10-CM | POA: Diagnosis not present

## 2022-10-12 DIAGNOSIS — I87312 Chronic venous hypertension (idiopathic) with ulcer of left lower extremity: Secondary | ICD-10-CM | POA: Diagnosis not present

## 2022-10-12 MED ORDER — TIZANIDINE HCL 4 MG PO TABS: ORAL_TABLET | ORAL | 2 refills | Status: DC

## 2022-10-12 MED ORDER — OXYCODONE HCL 10 MG PO TABS: 10.0000 mg | ORAL_TABLET | Freq: Four times a day (QID) | ORAL | 0 refills | Status: DC | PRN

## 2022-10-12 NOTE — Telephone Encounter (Signed)
Caller & Relationship to patient:  MRN #  [id number]   Call Back Number:   Date of Last Office Visit: 10/04/2022     Date of Next Office Visit: 10/30/2022    Medication(s) to be Refilled: Oxycodone , muscle relaxers  Preferred Pharmacy:   ** Please notify patient to allow 48-72 hours to process** **Let patient know to contact pharmacy at the end of the day to make sure medication is ready. ** **If patient has not been seen in a year or longer, book an appointment **Advise to use MyChart for refill requests OR to contact their pharmacy

## 2022-10-13 NOTE — Progress Notes (Signed)
DALTIN, CRIST [name] ([id number]) [id number] Page 1 of 3 Visit Report for 10/12/2022 Abuse Risk Screen Details Patient Name: Date of Service: Patrick [name] [facility] [name]. 10/12/2022 8:00 Patrick [name] Medical Record Number: [id number] Patient Account Number: [id number] Date of Birth/Sex: Treating RN: 07/22/1955 (67 y.o. Patrick [name], Patrick [name] Primary Care [name]: Patrick [name] Other Clinician: Referring Patrick [name]: Treating Patrick [name]: Patrick [name]: 0 Abuse Risk Screen Items Answer ABUSE RISK SCREEN: Has anyone close to you tried to hurt or harm you recentlyo No Do you feel uncomfortable with anyone in your familyo No Has anyone forced you do things that you didnt want to doo No Electronic Signature(s) Signed: 10/12/2022 12:17:26 PM By: Patrick Mu RN Entered By: Patrick [name] on 10/12/2022 08:32:18 -------------------------------------------------------------------------------- Activities of Daily Living Details Patient Name: Date of Service: Patrick [name]. 10/12/2022 8:00 Patrick [name] Medical Record Number: [id number] Patient Account Number: [id number] Date of Birth/Sex: Treating RN: Jul 25, 1955 (67 y.o. Patrick [name], Patrick [name] Primary Care Betania Dizon: Patrick [name] Other Clinician: Referring Patrick [name]: Treating Patrick [name]: Patrick [name]: 0 Activities of Daily Living Items Answer Activities of Daily Living (Please select one for each item) Drive Automobile Not Able T Medications ake Completely Able Use T elephone Completely Able Care for Appearance Completely Able Use T oilet Completely Able Bath / Shower Completely Able Dress Self Completely Able Feed Self Completely Able Walk Completely Able Get In / Out Bed Completely Able Housework Completely Able Prepare Meals Completely Able Handle Money Completely Able Shop for Self Completely  Able Electronic Signature(s) Signed: 10/12/2022 12:17:26 PM By: Patrick Mu RN Entered By: Patrick [name] on 10/12/2022 08:32:43 -------------------------------------------------------------------------------- Fall Risk Assessment Details Patient Name: Date of Service: Patrick [name], Patrick [name]. 10/12/2022 8:00 Patrick [name] Medical Record Number: [id number] Patient Account Number: [id number] Date of Birth/Sex: Treating RN: [birth year] (67 y.o. Patrick [name] Primary Care Dmarco Baldus: Patrick [name] Other Clinician: Referring Sherron Mummert: Treating [name]/Extender: Patrick Nurse Weeks in TreatmentIzora Gala [name], [name] ([id number]) [phone].pdf Page 2 of 3 Fall Risk Assessment Items Have you had 2 or more falls in the last 12 monthso 0 Yes Have you had any fall that resulted in injury in the last 12 monthso 0 No FALLS RISK SCREEN History of falling - immediate or within 3 months 0 No Secondary diagnosis (Do you have 2 or more medical diagnoseso) 15 Yes Ambulatory aid None/bed rest/wheelchair/nurse 0 No Crutches/cane/walker 0 No Furniture 0 No Intravenous therapy Access/Saline/Heparin Lock 0 No Gait/Transferring Normal/ bed rest/ wheelchair 0 No Weak (short steps with or without shuffle, stooped but able to lift head while walking, may seek 0 No support from furniture) Impaired (short steps with shuffle, may have difficulty arising from chair, head down, impaired 0 No balance) Mental Status Oriented to own ability 0 No Electronic Signature(s) Signed: 10/12/2022 12:17:26 PM By: Patrick Mu RN Entered By: Patrick [name] on 10/12/2022 08:33:15 -------------------------------------------------------------------------------- Foot Assessment Details Patient Name: Date of Service: Patrick [name], Patrick [name]. 10/12/2022 8:00 Patrick [name] Medical Record Number: [id number] Patient Account Number: [id number] Date of Birth/Sex: Treating  RN: [birth year] (67 y.o. Patrick [name] Primary Care Shaylan Tutton: Patrick [name] Other Clinician: Referring Patrick [name]: Treating Patrick [name]/Extender: Patrick [name]: 0 Foot Assessment Items Site Locations + = Sensation present, - = Sensation absent, C = Callus, U = Ulcer R = Redness, W = Warmth, [name] = Maceration, PU = Pre-ulcerative lesion F = Fissure, S =  Swelling, [name] = Dryness Assessment Right: Left: Other Deformity: No No Prior Foot Ulcer: No No Prior Amputation: No No Charcot Joint: No No Ambulatory Status: Ambulatory Without Help GaitHASSEN, Patrick [name] ([id number]) [id number] Page 3 of 3 Electronic Signature(s) Signed: 10/12/2022 12:17:26 PM By: Patrick Mu RN Entered By: Patrick [name] on 10/12/2022 08:39:43 -------------------------------------------------------------------------------- Nutrition Risk Screening Details Patient Name: Date of Service: Patrick [name]. 10/12/2022 8:00 Patrick [name] Medical Record Number: [id number] Patient Account Number: [id number] Date of Birth/Sex: Treating RN: [birth year] (67 y.o. Patrick [name] Primary Care Riann Oman: Patrick [name] Other Clinician: Referring Patrick [name]: Treating Patrick [name]/Extender: Patrick [name]: 0 Height (in): Weight (lbs): Body Mass Index (BMI): Nutrition Risk Screening Items Score Screening NUTRITION RISK SCREEN: I have an illness or condition that made me change the kind and/or amount of food I eat 0 No I eat fewer than two meals per day 0 No I eat few fruits and vegetables, or milk products 0 No I have three or more drinks of beer, liquor or wine almost every day 0 No I have tooth or mouth problems that make it hard for me to eat 0 No I don't always have enough money to buy the food I need 0 No I eat alone most of the time 0 No I take three or more different prescribed or  over-the-counter drugs Patrick day 0 No Without wanting to, I have lost or gained 10 pounds in the last six months 0 No I am not always physically able to shop, cook and/or feed myself 0 No Nutrition Protocols Good Risk Protocol 0 No interventions needed Moderate Risk Protocol High Risk Proctocol Risk Level: Good Risk Score: 0 Electronic Signature(s) Signed: 10/12/2022 12:17:26 PM By: Patrick Mu RN Entered By: Patrick [name] on 10/12/2022 08:33:03

## 2022-10-15 ENCOUNTER — Telehealth: Payer: Self-pay

## 2022-10-15 NOTE — Progress Notes (Signed)
BARTT, GONZAGA [name] ([id number]) [id number] Page 1 of 9 Visit Report for 10/12/2022 Allergy List Details Patient Name: Date of Service: [facility] [name]. 10/12/2022 8:00 A M Medical Record Number: [id number] Patient Account Number: [id number] Date of Birth/Sex: Treating RN: 09-19-1955 (67 y.o. Patrick [name] Primary Care Feiga Nadel: Jeanann Lewandowsky Other Clinician: Referring Muna Demers: Treating Oluwatobiloba Martin/Extender: [name], Olugbemiga Weeks in Treatment: 0 Allergies Active Allergies No Known Allergies Allergy Notes Electronic Signature(s) Signed: 10/12/2022 12:17:26 PM By: Fonnie Mu RN Entered By: Fonnie Mu on 10/11/2022 15:36:28 -------------------------------------------------------------------------------- Arrival Information Details Patient Name: Date of Service: [facility], [name] 10/12/2022 8:00 A M Medical Record Number: [id number] Patient Account Number: [id number] Date of Birth/Sex: Treating RN: [birth year] (67 y.o. Patrick [name] Primary Care Juanjesus Pepperman: Jeanann Lewandowsky Other Clinician: Referring Mackenzye Mackel: Treating Hannahmarie Asberry/Extender: Glade Nurse Weeks in Treatment: 0 Visit Information Patient Arrived: Ambulatory Arrival Time: 08:31 Accompanied By: self Transfer Assistance: None Patient Identification Verified: Yes Secondary Verification Process Completed: Yes Patient Requires Transmission-Based Precautions: No Patient Has Alerts: No Electronic Signature(s) Signed: 10/12/2022 12:17:26 PM By: Fonnie Mu RN Entered By: Fonnie Mu on 10/12/2022 08:32:10 -------------------------------------------------------------------------------- Clinic Level of Care Assessment Details Patient Name: Date of Service: [facility] [name]. 10/12/2022 8:00 A M Medical Record Number: [id number] Patient Account Number: [id number] Date of Birth/Sex: Treating  RN: 1956-03-27 (67 y.o. Patrick [name] Primary Care Kazuo Durnil: Jeanann Lewandowsky Other Clinician: Referring Judia Arnott: Treating Comfort Iversen/Extender: Glade Nurse Weeks in Treatment: 0 Clinic Level of Care Assessment Items TOOL 3 Quantity Score X- 1 0 Use when EandM and Procedure is performed on FOLLOW-UP visit ASSESSMENTS - Nursing Assessment / Reassessment X- 1 10 Reassessment of Co-morbidities (includes updates in patient status) X- 1 5 Reassessment of Adherence to Treatment Plan Patrick [name], Patrick [name] ([id number]) [id number] Page 2 of 9 ASSESSMENTS - Wound and Skin Assessment / Reassessment []  - Points for Wound Assessment can only be taken for a new wound of unknown or different etiology and a procedure is 0 NOT performed to that wound X- 1 5 Simple Wound Assessment / Reassessment - one wound []  - 0 Complex Wound Assessment / Reassessment - multiple wounds []  - 0 Dermatologic / Skin Assessment (not related to wound area) ASSESSMENTS - Focused Assessment X- 1 5 Circumferential Edema Measurements - multi extremities []  - 0 Nutritional Assessment / Counseling / Intervention []  - 0 Lower Extremity Assessment (monofilament, tuning fork, pulses) []  - 0 Peripheral Arterial Disease Assessment (using hand held doppler) ASSESSMENTS - Ostomy and/or Continence Assessment and Care []  - 0 Incontinence Assessment and Management []  - 0 Ostomy Care Assessment and Management (repouching, etc.) PROCESS - Coordination of Care []  - Points for Discharge Coordination can only be taken for a new wound of unknown or different etiology and a procedure 0 is NOT performed to that wound X- 1 15 Simple Patient / Family Education for ongoing care []  - 0 Complex (extensive) Patient / Family Education for ongoing care X- 1 10 Staff obtains Chiropractor, Records, T Results / Process Orders est []  - 0 Staff telephones HHA, Nursing Homes / Clarify  orders / etc []  - 0 Routine Transfer to another Facility (non-emergent condition) []  - 0 Routine Hospital Admission (non-emergent condition) X- 1 15 New Admissions / Manufacturing engineer / Ordering NPWT Apligraf, etc. , []  - 0 Emergency Hospital Admission (emergent condition) X- 1 10 Simple Discharge Coordination []  - 0 Complex (extensive) Discharge Coordination PROCESS - Special Needs []  -  0 Pediatric / Minor Patient Management []  - 0 Isolation Patient Management []  - 0 Hearing / Language / Visual special needs []  - 0 Assessment of Community assistance (transportation, [name]/C planning, etc.) []  - 0 Additional assistance / Altered mentation []  - 0 Support Surface(s) Assessment (bed, cushion, seat, etc.) INTERVENTIONS - Wound Cleansing / Measurement []  - Points for Wound Cleaning / Measurement, Wound Dressing, Specimen Collection and Specimen taken to lab can only 0 be taken for a new wound of unknown or different etiology and a procedure is NOT performed to that wound X- 1 5 Simple Wound Cleansing - one wound []  - 0 Complex Wound Cleansing - multiple wounds X- 1 5 Wound Imaging (photographs - any number of wounds) []  - 0 Wound Tracing (instead of photographs) X- 1 5 Simple Wound Measurement - one wound []  - 0 Complex Wound Measurement - multiple wounds INTERVENTIONS - Wound Dressings []  - 0 Small Wound Dressing one or multiple wounds X- 1 15 Medium Wound Dressing one or multiple wounds Patrick [name], Patrick [name] ([id number]) [id number] Page 3 of 9 []  - 0 Large Wound Dressing one or multiple wounds INTERVENTIONS - Miscellaneous []  - 0 External ear exam []  - 0 Specimen Collection (cultures, biopsies, blood, body fluids, etc.) []  - 0 Specimen(s) / Culture(s) sent or taken to Lab for analysis []  - 0 Patient Transfer (multiple staff / Nurse, adult / Similar devices) []  - 0 Simple Staple / Suture removal (25 or less) []  - 0 Complex Staple /  Suture removal (26 or more) []  - 0 Hypo / Hyperglycemic Management (close monitor of Blood Glucose) X- 1 15 Ankle / Brachial Index (ABI) - do not check if billed separately X- 1 5 Vital Signs Has the patient been seen at the hospital within the last three years: Yes Total Score: 125 Level Of Care: New/Established - Level 4 Electronic Signature(s) Signed: 10/12/2022 12:17:26 PM By: Fonnie Mu RN Entered By: Fonnie Mu on 10/12/2022 09:19:27 -------------------------------------------------------------------------------- Encounter Discharge Information Details Patient Name: Date of Service: [facility], [name] 10/12/2022 8:00 A M Medical Record Number: [id number] Patient Account Number: [id number] Date of Birth/Sex: Treating RN: 20-Oct-1955 (66 y.o. [name], [name] Primary Care Taylore Hinde: Jeanann Lewandowsky Other Clinician: Referring Saketh Daubert: Treating Tonie Vizcarrondo/Extender: Glade Nurse Weeks in Treatment: 0 Encounter Discharge Information Items Post Procedure Vitals Discharge Condition: Stable Temperature (F): 98.7 Ambulatory Status: Ambulatory Pulse (bpm): 74 Discharge Destination: Home Respiratory Rate (breaths/min): 17 Transportation: Private Auto Blood Pressure (mmHg): 120/80 Accompanied By: self Schedule Follow-up Appointment: Yes Clinical Summary of Care: Patient Declined Electronic Signature(s) Signed: 10/12/2022 12:17:26 PM By: Fonnie Mu RN Entered By: Fonnie Mu on 10/12/2022 09:20:55 -------------------------------------------------------------------------------- Lower Extremity Assessment Details Patient Name: Date of Service: [facility], [facility] [name]. 10/12/2022 8:00 A M Medical Record Number: [id number] Patient Account Number: [id number] Date of Birth/Sex: Treating RN: [birth year] (67 y.o. Patrick [name] Primary Care Jameis Newsham: Jeanann Lewandowsky Other Clinician: Referring Eden Rho: Treating  Lynniah Janoski/Extender: [name], Olugbemiga Weeks in Treatment: 0 Edema Assessment Assessed: [Left: Yes] [Right: No] Edema: [Left: N] [Right: o] Calf Left: Right: Point of Measurement: 34 cm From Medial Instep 28.5 cm Ankle Patrick [name], Patrick [name] ([id number]) [phone].pdf Page 4 of 9 Left: Right: Point of Measurement: 9 cm From Medial Instep 22.5 cm Knee To Floor Left: Right: From Medial Instep 49 cm Vascular Assessment Pulses: Dorsalis Pedis Palpable: [Left:Yes] Posterior Tibial Palpable: [Left:Yes] Blood Pressure: Brachial: [Left:83] Ankle: [Left:Dorsalis Pedis: 90 1.08] Electronic Signature(s) Signed: 10/12/2022 12:17:26 PM  By: Fonnie Mu RN Signed: 10/15/2022 10:32:07 AM By: [name] DO Entered By: [name] on 10/12/2022 08:48:57 -------------------------------------------------------------------------------- Multi Wound Chart Details Patient Name: Date of Service: [facility], [name] 10/12/2022 8:00 A M Medical Record Number: [id number] Patient Account Number: [id number] Date of Birth/Sex: Treating RN: Dec 21, 1955 (67 y.o. M) Primary Care Jazlen Ogarro: Jeanann Lewandowsky Other Clinician: Referring Lenis Nettleton: Treating Kathrynne Kulinski/Extender: [name], Olugbemiga Weeks in Treatment: 0 Vital Signs Height(in): Pulse(bpm): 64 Weight(lbs): Blood Pressure(mmHg): 83/49 Body Mass Index(BMI): Temperature(F): 97.7 Respiratory Rate(breaths/min): 17 [1:Photos:] [N/A:N/A] Left, Lateral Foot N/A N/A Wound Location: Gradually Appeared N/A N/A Wounding Event: Venous Leg Ulcer N/A N/A Primary Etiology: Sickle Cell Disease, Arrhythmia, N/A N/A Comorbid History: Congestive Heart Failure, Gout 07/20/2022 N/A N/A Date Acquired: 0 N/A N/A Weeks of Treatment: Open N/A N/A Wound Status: No N/A N/A Wound Recurrence: 0.5x1.8x0.1 N/A N/A Measurements L x W x [name] (cm) 0.707 N/A N/A A (cm) : rea 0.071 N/A  N/A Volume (cm) : Full Thickness Without Exposed N/A N/A Classification: Support Structures Medium N/A N/A Exudate Amount: Serosanguineous N/A N/A Exudate Type: red, brown N/A N/A Exudate Color: Distinct, outline attached N/A N/A Wound Margin: Small (1-33%) N/A N/A Granulation Amount: Red, Pink N/A N/A Granulation Quality: Large (67-100%) N/A N/A Necrotic AmountDUDLEY, MAGES [name] ([id number]) [id number] Page 5 of 9 Eschar, Adherent Slough N/A N/A Necrotic Tissue: Fascia: No N/A N/A Exposed Structures: Fat Layer (Subcutaneous Tissue): No Tendon: No Muscle: No Joint: No Bone: No None N/A N/A Epithelialization: Debridement - Excisional N/A N/A Debridement: Pre-procedure Verification/Time Out 08:54 N/A N/A Taken: Lidocaine N/A N/A Pain Control: Subcutaneous, Slough N/A N/A Tissue Debrided: Skin/Subcutaneous Tissue N/A N/A Level: 0.71 N/A N/A Debridement A (sq cm): rea Curette N/A N/A Instrument: Minimum N/A N/A Bleeding: Pressure N/A N/A Hemostasis A chieved: 0 N/A N/A Procedural Pain: 0 N/A N/A Post Procedural Pain: Procedure was tolerated well N/A N/A Debridement Treatment Response: 0.5x1.8x0.1 N/A N/A Post Debridement Measurements L x W x [name] (cm) 0.071 N/A N/A Post Debridement Volume: (cm) Excoriation: No N/A N/A Periwound Skin Texture: Induration: No Callus: No Crepitus: No Rash: No Scarring: No Dry/Scaly: Yes N/A N/A Periwound Skin Moisture: Maceration: No Atrophie Blanche: No N/A N/A Periwound Skin Color: Cyanosis: No Ecchymosis: No Erythema: No Hemosiderin Staining: No Mottled: No Pallor: No Rubor: No No Abnormality N/A N/A Temperature: Yes N/A N/A Tenderness on Palpation: Debridement N/A N/A Procedures Performed: Treatment Notes Electronic Signature(s) Signed: 10/15/2022 10:32:07 AM By: [name] DO Entered By: [name] on 10/12/2022  09:04:13 -------------------------------------------------------------------------------- Multi-Disciplinary Care Plan Details Patient Name: Date of Service: [facility], [facility] [name]. 10/12/2022 8:00 A M Medical Record Number: [id number] Patient Account Number: [id number] Date of Birth/Sex: Treating RN: [birth year] (67 y.o. [name], [name] Primary Care Shantoria Ellwood: Jeanann Lewandowsky Other Clinician: Referring Abdul Beirne: Treating Press Casale/Extender: Glade Nurse Weeks in Treatment: 0 Active Inactive Wound/Skin Impairment Nursing Diagnoses: Impaired tissue integrity Knowledge deficit related to ulceration/compromised skin integrity Goals: Patient will have a decrease in wound volume by X% from date: (specify in notes) Date Initiated: 10/12/2022 Target Resolution Date: 10/25/2022 Goal Status: Active Patient/caregiver will verbalize understanding of skin care regimen Date Initiated: 10/12/2022 Target Resolution Date: 10/25/2022 Goal Status: Active Ulcer/skin breakdown will have a volume reduction of 30% by week 4 Patrick [name], Patrick [name] ([id number]) [id number] Page 6 of 9 Date Initiated: 10/12/2022 Target Resolution Date: 10/26/2022 Goal Status: Active Interventions: Assess patient/caregiver ability to obtain necessary supplies Assess patient/caregiver ability to perform ulcer/skin care regimen  upon admission and as needed Assess ulceration(s) every visit Notes: Negative Pressure Wound Therapy Nursing Diagnoses: Knowledge deficit related to use and safety of the device Potential for imbalanced nutrition Impaired tissue integrity Goals: Patient/caregiver agrees to and verbalizes understanding of need to use Date Initiated: 10/12/2022 Target Resolution Date: 10/26/2022 Goal Status: Active Patient/caregiver will verbalize understanding of use of the device Date Initiated: 10/12/2022 Target Resolution Date: 10/25/2022 Goal Status:  Active Interventions: Assess drainage (amount and color) Assess patient nutrition upon admission and as needed per policy Assess patient understanding of disease process and management Notes: Electronic Signature(s) Signed: 10/12/2022 12:17:26 PM By: Fonnie Mu RN Entered By: Fonnie Mu on 10/12/2022 09:18:29 -------------------------------------------------------------------------------- Pain Assessment Details Patient Name: Date of Service: [facility], [name] 10/12/2022 8:00 A M Medical Record Number: [id number] Patient Account Number: [id number] Date of Birth/Sex: Treating RN: December 07, 1955 (66 y.o. Patrick [name] Primary Care Oretha Weismann: Jeanann Lewandowsky Other Clinician: Referring Bambie Pizzolato: Treating Lacinda Curvin/Extender: Glade Nurse Weeks in Treatment: 0 Active Problems Location of Pain Severity and Description of Pain Patient Has Paino No Site Locations Pain Management and Medication Current Pain ManagementMARICUS, Patrick [name] ([id number]) [id number] Page 7 of 9 Electronic Signature(s) Signed: 10/12/2022 12:17:26 PM By: Fonnie Mu RN Entered By: Fonnie Mu on 10/12/2022 08:32:54 -------------------------------------------------------------------------------- Patient/Caregiver Education Details Patient Name: Date of Service: [facility], [name] 4/26/2024andnbsp8:00 A M Medical Record Number: [id number] Patient Account Number: [id number] Date of Birth/Gender: Treating RN: 20-May-1956 (67 y.o. Patrick [name] Primary Care Physician: Jeanann Lewandowsky Other Clinician: Referring Physician: Treating Physician/Extender: Glade Nurse Weeks in Treatment: 0 Education Assessment Education Provided To: Patient Education Topics Provided Wound/Skin Impairment: Methods: Explain/Verbal Responses: State content correctly Electronic Signature(s) Signed: 10/12/2022  12:17:26 PM By: Fonnie Mu RN Entered By: Fonnie Mu on 10/12/2022 09:18:38 -------------------------------------------------------------------------------- Wound Assessment Details Patient Name: Date of Service: [facility] [name] 10/12/2022 8:00 A M Medical Record Number: [id number] Patient Account Number: [id number] Date of Birth/Sex: Treating RN: 22-May-1956 (67 y.o. M) Primary Care Quantez Schnyder: Jeanann Lewandowsky Other Clinician: Referring Jared Cahn: Treating Aiysha Jillson/Extender: [name], Olugbemiga Weeks in Treatment: 0 Wound Status Wound Number: 1 Primary Venous Leg Ulcer Etiology: Wound Location: Left, Lateral Foot Wound Status: Open Wounding Event: Gradually Appeared Comorbid Sickle Cell Disease, Arrhythmia, Congestive Heart Failure, Date Acquired: 07/20/2022 History: Gout Weeks Of Treatment: 0 Clustered Wound: No Photos Wound Measurements Length: (cm) 0.5 Width: (cm) 1.8 Depth: (cm) 0.1 Area: (cm) 0.70 Volume: (cm) 0.07 Zapanta, Patrick [name] ([id number]) % Reduction in Area: % Reduction in Volume: Epithelialization: None 7 Tunneling: No 1 Undermining: No [id number] Page 8 of 9 Wound Description Classification: Full Thickness Without Exposed Support Structures Wound Margin: Distinct, outline attached Exudate Amount: Medium Exudate Type: Serosanguineous Exudate Color: red, brown Foul Odor After Cleansing: No Slough/Fibrino Yes Wound Bed Granulation Amount: Small (1-33%) Exposed Structure Granulation Quality: Red, Pink Fascia Exposed: No Necrotic Amount: Large (67-100%) Fat Layer (Subcutaneous Tissue) Exposed: No Necrotic Quality: Eschar, Adherent Slough Tendon Exposed: No Muscle Exposed: No Joint Exposed: No Bone Exposed: No Periwound Skin Texture Texture Color No Abnormalities Noted: No No Abnormalities Noted: No Callus: No Atrophie Blanche: No Crepitus: No Cyanosis: No Excoriation:  No Ecchymosis: No Induration: No Erythema: No Rash: No Hemosiderin Staining: No Scarring: No Mottled: No Pallor: No Moisture Rubor: No No Abnormalities Noted: No Dry / Scaly: Yes Temperature / Pain Maceration: No Temperature: No Abnormality Tenderness on Palpation: Yes Treatment Notes Wound #1 (Foot) Wound Laterality: Left, Lateral  Cleanser Soap and Water Discharge Instruction: May shower and wash wound with dial antibacterial soap and water prior to dressing change. Vashe 5.8 (oz) Discharge Instruction: Cleanse the wound with Vashe prior to applying a clean dressing using gauze sponges, not tissue or cotton balls. Peri-Wound Care Topical Gentamicin Discharge Instruction: As directed by physician Mupirocin Ointment Discharge Instruction: Apply Mupirocin (Bactroban) as instructed Primary Dressing Hydrofera Blue Ready Transfer Foam, 2.5x2.5 (in/in) Discharge Instruction: Apply directly to wound bed as directed Secondary Dressing ABD Pad, 5x9 Discharge Instruction: Apply over primary dressing as directed. Secured With Compression Wrap Kerlix Roll 4.5x3.1 (in/yd) Discharge Instruction: Apply Kerlix and Coban compression as directed. Coban Self-Adherent Wrap 4x5 (in/yd) Discharge Instruction: Apply over Kerlix as directed. Compression Stockings Add-Ons Electronic Signature(s) Signed: 10/12/2022 12:17:26 PM By: Fonnie Mu RN Entered By: Fonnie Mu on 10/12/2022 08:37:54 Patrick [name], Patrick [name] ([id number]) [id number] Page 9 of 9 -------------------------------------------------------------------------------- Vitals Details Patient Name: Date of Service: [facility] [name]. 10/12/2022 8:00 A M Medical Record Number: [id number] Patient Account Number: [id number] Date of Birth/Sex: Treating RN: 01-18-1956 (67 y.o. Patrick [name] Primary Care Valora Norell: Jeanann Lewandowsky Other Clinician: Referring Elyza Whitt: Treating  Zianne Schubring/Extender: Glade Nurse Weeks in Treatment: 0 Vital Signs Time Taken: 08:42 Temperature (F): 97.7 Pulse (bpm): 64 Respiratory Rate (breaths/min): 17 Blood Pressure (mmHg): 83/49 Reference Range: 80 - 120 mg / dl Electronic Signature(s) Signed: 10/12/2022 12:17:26 PM By: Fonnie Mu RN Entered By: Fonnie Mu on 10/12/2022 08:42:27

## 2022-10-15 NOTE — Telephone Encounter (Signed)
Called pt and he is schedule for 01/05/23 at 11:30 am. Karin Lieu

## 2022-10-15 NOTE — Progress Notes (Signed)
IMANOL, BIHL [name] ([id number]) [id number] Page 1 of 8 Visit Report for 10/12/2022 Chief Complaint Document Details Patient Name: Date of Service: [facility] [name]. 10/12/2022 8:00 A M Medical Record Number: [id number] Patient Account Number: [id number] Date of Birth/Sex: Treating RN: February 11, 1956 (67 y.o. M) Primary Care Provider: Jeanann Lewandowsky Other Clinician: Referring Provider: Treating Provider/Extender: [name], Olugbemiga Weeks in Treatment: 0 Information Obtained from: Patient Chief Complaint 10/12/2022; left lower extremity wound Electronic Signature(s) Signed: 10/15/2022 10:32:07 AM By: [name] DO Entered By: [name] on 10/12/2022 09:04:38 -------------------------------------------------------------------------------- Debridement Details Patient Name: Date of Service: [facility], [name] 10/12/2022 8:00 A M Medical Record Number: [id number] Patient Account Number: [id number] Date of Birth/Sex: Treating RN: [birth year] (67 y.o. [name], [name] Primary Care Provider: Jeanann Lewandowsky Other Clinician: Referring Provider: Treating Provider/Extender: Glade Nurse Weeks in Treatment: 0 Debridement Performed for Assessment: Wound #1 Left,Lateral Foot Performed By: Physician [name], DO Debridement Type: Debridement Severity of Tissue Pre Debridement: Fat layer exposed Level of Consciousness (Pre-procedure): Awake and Alert Pre-procedure Verification/Time Out Yes - 08:54 Taken: Start Time: 08:54 Pain Control: Lidocaine Percent of Wound Bed Debrided: 100% T Area Debrided (cm): otal 0.71 Tissue and other material debrided: Viable, Non-Viable, Slough, Subcutaneous, Slough Level: Skin/Subcutaneous Tissue Debridement Description: Excisional Instrument: Curette Bleeding: Minimum Hemostasis Achieved: Pressure End Time: 08:54 Procedural Pain: 0 Post Procedural  Pain: 0 Response to Treatment: Procedure was tolerated well Level of Consciousness (Post- Awake and Alert procedure): Post Debridement Measurements of Total Wound Length: (cm) 0.5 Width: (cm) 1.8 Depth: (cm) 0.1 Volume: (cm) 0.071 Character of Wound/Ulcer Post Debridement: Improved Severity of Tissue Post Debridement: Fat layer exposed Post Procedure Diagnosis Same as Pre-procedure Electronic Signature(s) Signed: 10/12/2022 12:17:26 PM By: Fonnie Mu RN Signed: 10/15/2022 10:32:07 AM By: Thalia Party, Evlyn Clines [name] ([id number]) [id number] Page 2 of 8 Entered By: Fonnie Mu on 10/12/2022 08:55:05 -------------------------------------------------------------------------------- HPI Details Patient Name: Date of Service: [facility] [name]. 10/12/2022 8:00 A M Medical Record Number: [id number] Patient Account Number: [id number] Date of Birth/Sex: Treating RN: May 26, 1956 (67 y.o. M) Primary Care Provider: Jeanann Lewandowsky Other Clinician: Referring Provider: Treating Provider/Extender: Glade Nurse Weeks in Treatment: 0 History of Present Illness HPI Description: 10/12/2022 [name] is a 67 year old male with a past medical history of end-stage renal disease on hemodialysis, sickle cell disease, blindness, atrial fibrillation on blood thinner and chronic systolic congestive heart failure that presents to the clinic for a 2 to 58-month history of nonhealing ulcer to the left lateral foot. He is not sure how it started. He recently saw his primary care physician who started him on doxycycline. He has been keeping the area covered. He currently denies signs of infection. Electronic Signature(s) Signed: 10/15/2022 10:32:07 AM By: [name] DO Entered By: [name] on 10/12/2022 09:06:54 -------------------------------------------------------------------------------- Physical Exam  Details Patient Name: Date of Service: [facility] [name]. 10/12/2022 8:00 A M Medical Record Number: [id number] Patient Account Number: [id number] Date of Birth/Sex: Treating RN: 09/17/1955 (67 y.o. M) Primary Care Provider: Jeanann Lewandowsky Other Clinician: Referring Provider: Treating Provider/Extender: [name], Olugbemiga Weeks in Treatment: 0 Constitutional respirations regular, non-labored and within target range for patient.. Cardiovascular 2+ dorsalis pedis/posterior tibialis pulses. Psychiatric pleasant and cooperative. Notes T the left lateral foot there is an open wound with nonviable tissue and scant granulation tissue. No signs of surrounding soft tissue infection. o Electronic Signature(s) Signed: 10/15/2022 10:32:07 AM  By: [name] DO Entered By: [name] on 10/12/2022 09:07:39 -------------------------------------------------------------------------------- Physician Orders Details Patient Name: Date of Service: [facility], [name] 10/12/2022 8:00 A M Medical Record Number: [id number] Patient Account Number: [id number] Date of Birth/Sex: Treating RN: Jan 08, 1956 (67 y.o. Patrick [name] Primary Care Provider: Jeanann Lewandowsky Other Clinician: Referring Provider: Treating Provider/Extender: Glade Nurse Weeks in Treatment: 0 Verbal / Phone Orders: No Diagnosis Coding ICD-10 Coding Code Description [phone] Non-pressure chronic ulcer of other part of left lower leg with fat layer exposed Patrick [name], Patrick [name] ([id number]) [id number] Page 3 of 8 I87.312 Chronic venous hypertension (idiopathic) with ulcer of left lower extremity N18.6 End stage renal disease D57.1 Sickle-cell disease without crisis J44.9 Chronic obstructive pulmonary disease, unspecified H54.8 Legal blindness, as defined in [place] Follow-up Appointments ppointment in 1 week. - w/ Dr. Mikey Bussing and Maryruth Bun Rm  # 9 Friday 10/19/22 @ 10:15 Return A Anesthetic (In clinic) Topical Lidocaine 5% applied to wound bed Bathing/ Shower/ Hygiene May shower with protection but do not get wound dressing(s) wet. Protect dressing(s) with water repellant cover (for example, large plastic bag) or a cast cover and may then take shower. Edema Control - Lymphedema / SCD / Other Elevate legs to the level of the heart or above for 30 minutes daily and/or when sitting for 3-4 times a day throughout the day. Avoid standing for long periods of time. Wound Treatment Wound #1 - Foot Wound Laterality: Left, Lateral Cleanser: Soap and Water 1 x Per Week/15 Days Discharge Instructions: May shower and wash wound with dial antibacterial soap and water prior to dressing change. Cleanser: Vashe 5.8 (oz) 1 x Per Week/15 Days Discharge Instructions: Cleanse the wound with Vashe prior to applying a clean dressing using gauze sponges, not tissue or cotton balls. Topical: Gentamicin 1 x Per Week/15 Days Discharge Instructions: As directed by physician Topical: Mupirocin Ointment 1 x Per Week/15 Days Discharge Instructions: Apply Mupirocin (Bactroban) as instructed Prim Dressing: Hydrofera Blue Ready Transfer Foam, 2.5x2.5 (in/in) 1 x Per Week/15 Days ary Discharge Instructions: Apply directly to wound bed as directed Secondary Dressing: ABD Pad, 5x9 1 x Per Week/15 Days Discharge Instructions: Apply over primary dressing as directed. Compression Wrap: Kerlix Roll 4.5x3.1 (in/yd) 1 x Per Week/15 Days Discharge Instructions: Apply Kerlix and Coban compression as directed. Compression Wrap: Coban Self-Adherent Wrap 4x5 (in/yd) 1 x Per Week/15 Days Discharge Instructions: Apply over Kerlix as directed. Patient Medications llergies: No Known Allergies A Notifications Medication Indication Start End PRN debridements/pain lidocaine DOSE topical 5 % gel - gel topical once daily Electronic Signature(s) Signed: 10/15/2022 10:32:07 AM  By: [name] DO Entered By: [name] on 10/12/2022 09:07:48 -------------------------------------------------------------------------------- Problem List Details Patient Name: Date of Service: [facility], [name] 10/12/2022 8:00 A M Medical Record Number: [id number] Patient Account Number: [id number] Date of Birth/Sex: Treating RN: Sep 22, 1955 (67 y.o. M) Primary Care Provider: Jeanann Lewandowsky Other Clinician: Referring Provider: Treating Provider/Extender: [name], Olugbemiga Weeks in Treatment: [address] Patrick [name], Patrick [name] ([id number]) [id number] Page 4 of 8 ICD-10 Encounter Code Description Active Date MDM Diagnosis [phone] Non-pressure chronic ulcer of other part of left lower leg with fat layer exposed4/26/2024 No Yes I87.312 Chronic venous hypertension (idiopathic) with ulcer of left lower extremity 10/12/2022 No Yes N18.6 End stage renal disease 10/12/2022 No Yes D57.1 Sickle-cell disease without crisis 10/12/2022 No Yes J44.9 Chronic obstructive pulmonary disease, unspecified 10/12/2022 No Yes H54.8 Legal blindness, as defined in [place] 10/12/2022 No  Yes Inactive Problems Resolved Problems Electronic Signature(s) Signed: 10/15/2022 10:32:07 AM By: [name] DO Entered By: [name] on 10/12/2022 09:04:06 -------------------------------------------------------------------------------- Progress Note Details Patient Name: Date of Service: [facility], [name] 10/12/2022 8:00 A M Medical Record Number: [id number] Patient Account Number: [id number] Date of Birth/Sex: Treating RN: [birth year] (67 y.o. M) Primary Care Provider: Jeanann Lewandowsky Other Clinician: Referring Provider: Treating Provider/Extender: Glade Nurse Weeks in Treatment: 0 Subjective Chief Complaint Information obtained from Patient 10/12/2022; left lower extremity wound History of Present Illness  (HPI) 10/12/2022 [name] is a 67 year old male with a past medical history of end-stage renal disease on hemodialysis, sickle cell disease, blindness, atrial fibrillation on blood thinner and chronic systolic congestive heart failure that presents to the clinic for a 2 to 37-month history of nonhealing ulcer to the left lateral foot. He is not sure how it started. He recently saw his primary care physician who started him on doxycycline. He has been keeping the area covered. He currently denies signs of infection. Patient History Information obtained from Patient, Chart. Allergies No Known Allergies Family History Heart Disease - Mother, No family history of Cancer, Diabetes, Hereditary Spherocytosis, Hypertension, Kidney Disease, Lung Disease, Seizures, Stroke, Thyroid Problems, Tuberculosis. Social History Former smoker, Alcohol Use - Never, Drug Use - No History, Caffeine Use - Rarely. Medical History Patrick [name], Patrick [name] ([id number]) [id number] Page 5 of 8 Hematologic/Lymphatic Patient has history of Sickle Cell Disease Cardiovascular Patient has history of Arrhythmia - A fib, Congestive Heart Failure Denies history of Hypertension Musculoskeletal Patient has history of Gout Medical A Surgical History Notes nd Cardiovascular mitral regurgitation; tricuspid regurgitation!!! Review of Systems (ROS) Constitutional Symptoms (General Health) Denies complaints or symptoms of Fatigue, Fever, Chills, Marked Weight Change. Eyes Denies complaints or symptoms of Dry Eyes, Vision Changes, Glasses / Contacts, legally blind Ear/Nose/Mouth/Throat Denies complaints or symptoms of Chronic sinus problems or rhinitis. Respiratory Complains or has symptoms of Shortness of Breath. Denies complaints or symptoms of Chronic or frequent coughs, PULMONARY HYPERTENSION Gastrointestinal GERD, empyema of gall bladder Endocrine Denies complaints or symptoms of  Heat/cold intolerance. Genitourinary CKD STAGE 5 DIALYSYS T TH SAT Integumentary (Skin) Complains or has symptoms of Wounds. Neurologic Denies complaints or symptoms of Numbness/parasthesias. Psychiatric Denies complaints or symptoms of Claustrophobia. Objective Constitutional respirations regular, non-labored and within target range for patient.. Vitals Time Taken: 8:42 AM, Temperature: 97.7 F, Pulse: 64 bpm, Respiratory Rate: 17 breaths/min, Blood Pressure: 83/49 mmHg. Cardiovascular 2+ dorsalis pedis/posterior tibialis pulses. Psychiatric pleasant and cooperative. General Notes: T the left lateral foot there is an open wound with nonviable tissue and scant granulation tissue. No signs of surrounding soft tissue infection. o Integumentary (Hair, Skin) Wound #1 status is Open. Original cause of wound was Gradually Appeared. The date acquired was: 07/20/2022. The wound is located on the Left,Lateral Foot. The wound measures 0.5cm length x 1.8cm width x 0.1cm depth; 0.707cm^2 area and 0.071cm^3 volume. There is no tunneling or undermining noted. There is a medium amount of serosanguineous drainage noted. The wound margin is distinct with the outline attached to the wound base. There is small (1-33%) red, pink granulation within the wound bed. There is a large (67-100%) amount of necrotic tissue within the wound bed including Eschar and Adherent Slough. The periwound skin appearance exhibited: Dry/Scaly. The periwound skin appearance did not exhibit: Callus, Crepitus, Excoriation, Induration, Rash, Scarring, Maceration, Atrophie Blanche, Cyanosis, Ecchymosis, Hemosiderin Staining, Mottled, Pallor, Rubor, Erythema. Periwound temperature was noted as No Abnormality.  The periwound has tenderness on palpation. Assessment Active Problems ICD-10 Non-pressure chronic ulcer of other part of left lower leg with fat layer exposed Chronic venous hypertension (idiopathic) with ulcer of left lower  extremity End stage renal disease Sickle-cell disease without crisis Chronic obstructive pulmonary disease, unspecified Legal blindness, as defined in [place] Patient presents with a 64-month history of nonhealing ulcer to the left lateral ankle with unclear etiology but nonhealing due to venous insufficiency and endAIMEE, Patrick [name] ([id number]) [id number] Page 6 of 8 stage renal disease. I debrided nonviable tissue. His ABIs were normal. He should have adequate blood flow for healing. He knows to not get the wrap wet or keep this on for more than 7 days. I recommended antibiotic ointment with Hydrofera Blue under compression wrap. Procedures Wound #1 Pre-procedure diagnosis of Wound #1 is a Venous Leg Ulcer located on the Left,Lateral Foot .Severity of Tissue Pre Debridement is: Fat layer exposed. There was a Excisional Skin/Subcutaneous Tissue Debridement with a total area of 0.71 sq cm performed by [name], DO. With the following instrument(s): Curette to remove Viable and Non-Viable tissue/material. Material removed includes Subcutaneous Tissue and Slough and after achieving pain control using Lidocaine. No specimens were taken. A time out was conducted at 08:54, prior to the start of the procedure. A Minimum amount of bleeding was controlled with Pressure. The procedure was tolerated well with a pain level of 0 throughout and a pain level of 0 following the procedure. Post Debridement Measurements: 0.5cm length x 1.8cm width x 0.1cm depth; 0.071cm^3 volume. Character of Wound/Ulcer Post Debridement is improved. Severity of Tissue Post Debridement is: Fat layer exposed. Post procedure Diagnosis Wound #1: Same as Pre-Procedure Plan Follow-up Appointments: Return Appointment in 1 week. - w/ Dr. Mikey Bussing and Maryruth Bun Rm # 9 Friday 10/19/22 @ 10:15 Anesthetic: (In clinic) Topical Lidocaine 5% applied to wound bed Bathing/ Shower/ Hygiene: May shower with  protection but do not get wound dressing(s) wet. Protect dressing(s) with water repellant cover (for example, large plastic bag) or a cast cover and may then take shower. Edema Control - Lymphedema / SCD / Other: Elevate legs to the level of the heart or above for 30 minutes daily and/or when sitting for 3-4 times a day throughout the day. Avoid standing for long periods of time. The following medication(s) was prescribed: lidocaine topical 5 % gel gel topical once daily for PRN debridements/pain was prescribed at facility WOUND #1: - Foot Wound Laterality: Left, Lateral Cleanser: Soap and Water 1 x Per Week/15 Days Discharge Instructions: May shower and wash wound with dial antibacterial soap and water prior to dressing change. Cleanser: Vashe 5.8 (oz) 1 x Per Week/15 Days Discharge Instructions: Cleanse the wound with Vashe prior to applying a clean dressing using gauze sponges, not tissue or cotton balls. Topical: Gentamicin 1 x Per Week/15 Days Discharge Instructions: As directed by physician Topical: Mupirocin Ointment 1 x Per Week/15 Days Discharge Instructions: Apply Mupirocin (Bactroban) as instructed Prim Dressing: Hydrofera Blue Ready Transfer Foam, 2.5x2.5 (in/in) 1 x Per Week/15 Days ary Discharge Instructions: Apply directly to wound bed as directed Secondary Dressing: ABD Pad, 5x9 1 x Per Week/15 Days Discharge Instructions: Apply over primary dressing as directed. Com pression Wrap: Kerlix Roll 4.5x3.1 (in/yd) 1 x Per Week/15 Days Discharge Instructions: Apply Kerlix and Coban compression as directed. Com pression Wrap: Coban Self-Adherent Wrap 4x5 (in/yd) 1 x Per Week/15 Days Discharge Instructions: Apply over Kerlix as directed. 1. In office sharp debridement 2. Hydrofera  Blue with antibiotic ointment under Kerlix/Cobanooleft lower extremity 3. Follow-up in 1 week Electronic Signature(s) Signed: 10/15/2022 10:32:07 AM By: [name] DO Entered By: [name]  on 10/12/2022 09:10:01 -------------------------------------------------------------------------------- HxROS Details Patient Name: Date of Service: [facility], [name] 10/12/2022 8:00 A M Medical Record Number: [id number] Patient Account Number: [id number] Date of Birth/Sex: Treating RN: Aug 12, 1955 (67 y.o. Patrick [name] Primary Care Provider: Jeanann Lewandowsky Other Clinician: Referring Provider: Treating Provider/Extender: Glade Nurse Weeks in Treatment: 0 Information Obtained From Patient Chart Constitutional Symptoms (General Health) Complaints and Symptoms: Negative for: Fatigue; Fever; Chills; Marked Weight Change Patrick [name], Patrick [name] ([id number]) [id number] Page 7 of 8 Eyes Complaints and Symptoms: Negative for: Dry Eyes; Vision Changes; Glasses / Contacts Review of System Notes: legally blind Ear/Nose/Mouth/Throat Complaints and Symptoms: Negative for: Chronic sinus problems or rhinitis Respiratory Complaints and Symptoms: Positive for: Shortness of Breath Negative for: Chronic or frequent coughs Review of System Notes: PULMONARY HYPERTENSION Endocrine Complaints and Symptoms: Negative for: Heat/cold intolerance Integumentary (Skin) Complaints and Symptoms: Positive for: Wounds Neurologic Complaints and Symptoms: Negative for: Numbness/parasthesias Psychiatric Complaints and Symptoms: Negative for: Claustrophobia Hematologic/Lymphatic Medical History: Positive for: Sickle Cell Disease Cardiovascular Medical History: Positive for: Arrhythmia - A fib; Congestive Heart Failure Negative for: Hypertension Past Medical History Notes: mitral regurgitation; tricuspid regurgitation!!! Gastrointestinal Complaints and Symptoms: Review of System Notes: GERD, empyema of gall bladder Genitourinary Complaints and Symptoms: Review of System Notes: CKD STAGE 5 DIALYSYS T TH  SAT Immunological Musculoskeletal Medical History: Positive for: Gout Oncologic Immunizations Patrick [name], Patrick [name] ([id number]) [id number] Page 8 of 8 Implantable Devices Yes Family and Social History Cancer: No; Diabetes: No; Heart Disease: Yes - Mother; Hereditary Spherocytosis: No; Hypertension: No; Kidney Disease: No; Lung Disease: No; Seizures: No; Stroke: No; Thyroid Problems: No; Tuberculosis: No; Former smoker; Alcohol Use: Never; Drug Use: No History; Caffeine Use: Rarely; Financial Concerns: No; Food, Clothing or Shelter Needs: No; Support System Lacking: No; Transportation Concerns: No Electronic Signature(s) Signed: 10/12/2022 12:17:26 PM By: Fonnie Mu RN Signed: 10/15/2022 10:32:07 AM By: [name] DO Entered By: Fonnie Mu on 10/11/2022 15:41:22 -------------------------------------------------------------------------------- SuperBill Details Patient Name: Date of Service: [facility] [name]. 10/12/2022 Medical Record Number: [id number] Patient Account Number: [id number] Date of Birth/Sex: Treating RN: 08-20-1955 (67 y.o. M) Primary Care Provider: Jeanann Lewandowsky Other Clinician: Referring Provider: Treating Provider/Extender: [name], Olugbemiga Weeks in Treatment: 0 Diagnosis Coding ICD-10 Codes Code Description [phone] Non-pressure chronic ulcer of other part of left lower leg with fat layer exposed I87.312 Chronic venous hypertension (idiopathic) with ulcer of left lower extremity N18.6 End stage renal disease D57.1 Sickle-cell disease without crisis J44.9 Chronic obstructive pulmonary disease, unspecified H54.8 Legal blindness, as defined in [place] Facility Procedures : CPT4 Code: [id number] Description: 99214 - WOUND CARE VISIT-LEV 4 EST PT Modifier: Quantity: 1 : CPT4 Code: [id number] Description: 11042 - DEB SUBQ TISSUE 20 SQ CM/< ICD-10 Diagnosis Description L97.822 Non-pressure chronic  ulcer of other part of left lower leg with fat layer expos I87.312 Chronic venous hypertension (idiopathic) with ulcer of left lower extremity Modifier: ed Quantity: 1 Physician Procedures : CPT4 Code Description Modifier [id number] 99204 - WC PHYS LEVEL 4 - NEW PT ICD-10 Diagnosis Description L97.822 Non-pressure chronic ulcer of other part of left lower leg with fat layer exposed I87.312 Chronic venous hypertension (idiopathic) with ulcer  of left lower extremity N18.6 End stage renal disease D57.1 Sickle-cell disease without crisis Quantity: 1 : [id number] 11042 - WC PHYS  SUBQ TISS 20 SQ CM ICD-10 Diagnosis Description L97.822 Non-pressure chronic ulcer of other part of left lower leg with fat layer exposed I87.312 Chronic venous hypertension (idiopathic) with ulcer of left lower extremity Quantity: 1 Electronic Signature(s) Signed: 10/12/2022 12:17:26 PM By: Fonnie Mu RN Signed: 10/15/2022 10:32:07 AM By: [name] DO Entered By: Fonnie Mu on 10/12/2022 09:19:35

## 2022-10-15 NOTE — Telephone Encounter (Signed)
Error

## 2022-10-16 DIAGNOSIS — Z992 Dependence on renal dialysis: Secondary | ICD-10-CM | POA: Diagnosis not present

## 2022-10-16 DIAGNOSIS — N186 End stage renal disease: Secondary | ICD-10-CM | POA: Diagnosis not present

## 2022-10-16 DIAGNOSIS — I158 Other secondary hypertension: Secondary | ICD-10-CM | POA: Diagnosis not present

## 2022-10-18 ENCOUNTER — Non-Acute Institutional Stay (HOSPITAL_COMMUNITY)
Admission: RE | Admit: 2022-10-18 | Discharge: 2022-10-18 | Disposition: A | Discharge status: Home / Self Care | Payer: PPO | Source: Ambulatory Visit | Attending: Internal Medicine | Admitting: Internal Medicine

## 2022-10-18 VITALS — BP 106/62 | HR 68 | Temp 97.9°F | Resp 16

## 2022-10-18 DIAGNOSIS — N189 Chronic kidney disease, unspecified: Secondary | ICD-10-CM | POA: Insufficient documentation

## 2022-10-18 DIAGNOSIS — G894 Chronic pain syndrome: Secondary | ICD-10-CM | POA: Diagnosis present

## 2022-10-18 DIAGNOSIS — Z681 Body mass index (BMI) 19 or less, adult: Secondary | ICD-10-CM | POA: Diagnosis not present

## 2022-10-18 DIAGNOSIS — K259 Gastric ulcer, unspecified as acute or chronic, without hemorrhage or perforation: Secondary | ICD-10-CM | POA: Diagnosis not present

## 2022-10-18 DIAGNOSIS — D5700 Hb-SS disease with crisis, unspecified: Secondary | ICD-10-CM | POA: Diagnosis present

## 2022-10-18 DIAGNOSIS — R0602 Shortness of breath: Secondary | ICD-10-CM | POA: Diagnosis not present

## 2022-10-18 DIAGNOSIS — D631 Anemia in chronic kidney disease: Secondary | ICD-10-CM | POA: Diagnosis not present

## 2022-10-18 DIAGNOSIS — I9589 Other hypotension: Secondary | ICD-10-CM | POA: Diagnosis present

## 2022-10-18 DIAGNOSIS — Z1152 Encounter for screening for COVID-19: Secondary | ICD-10-CM | POA: Diagnosis not present

## 2022-10-18 DIAGNOSIS — N186 End stage renal disease: Secondary | ICD-10-CM | POA: Diagnosis present

## 2022-10-18 DIAGNOSIS — I4821 Permanent atrial fibrillation: Secondary | ICD-10-CM | POA: Diagnosis present

## 2022-10-18 DIAGNOSIS — D62 Acute posthemorrhagic anemia: Secondary | ICD-10-CM | POA: Diagnosis present

## 2022-10-18 DIAGNOSIS — I5022 Chronic systolic (congestive) heart failure: Secondary | ICD-10-CM | POA: Diagnosis present

## 2022-10-18 DIAGNOSIS — E440 Moderate protein-calorie malnutrition: Secondary | ICD-10-CM | POA: Diagnosis present

## 2022-10-18 DIAGNOSIS — J449 Chronic obstructive pulmonary disease, unspecified: Secondary | ICD-10-CM | POA: Diagnosis present

## 2022-10-18 DIAGNOSIS — K2901 Acute gastritis with bleeding: Secondary | ICD-10-CM | POA: Diagnosis present

## 2022-10-18 DIAGNOSIS — R188 Other ascites: Secondary | ICD-10-CM | POA: Diagnosis present

## 2022-10-18 DIAGNOSIS — K219 Gastro-esophageal reflux disease without esophagitis: Secondary | ICD-10-CM | POA: Diagnosis present

## 2022-10-18 DIAGNOSIS — Z9189 Other specified personal risk factors, not elsewhere classified: Secondary | ICD-10-CM | POA: Diagnosis not present

## 2022-10-18 DIAGNOSIS — I4891 Unspecified atrial fibrillation: Secondary | ICD-10-CM | POA: Diagnosis not present

## 2022-10-18 DIAGNOSIS — K8689 Other specified diseases of pancreas: Secondary | ICD-10-CM | POA: Diagnosis present

## 2022-10-18 DIAGNOSIS — K2950 Unspecified chronic gastritis without bleeding: Secondary | ICD-10-CM | POA: Diagnosis not present

## 2022-10-18 DIAGNOSIS — I4819 Other persistent atrial fibrillation: Secondary | ICD-10-CM | POA: Diagnosis not present

## 2022-10-18 DIAGNOSIS — Z8711 Personal history of peptic ulcer disease: Secondary | ICD-10-CM | POA: Diagnosis not present

## 2022-10-18 DIAGNOSIS — D571 Sickle-cell disease without crisis: Secondary | ICD-10-CM | POA: Diagnosis not present

## 2022-10-18 DIAGNOSIS — I509 Heart failure, unspecified: Secondary | ICD-10-CM | POA: Diagnosis not present

## 2022-10-18 DIAGNOSIS — I700 Atherosclerosis of aorta: Secondary | ICD-10-CM | POA: Diagnosis not present

## 2022-10-18 DIAGNOSIS — K449 Diaphragmatic hernia without obstruction or gangrene: Secondary | ICD-10-CM | POA: Diagnosis present

## 2022-10-18 DIAGNOSIS — I87312 Chronic venous hypertension (idiopathic) with ulcer of left lower extremity: Secondary | ICD-10-CM | POA: Diagnosis not present

## 2022-10-18 DIAGNOSIS — M109 Gout, unspecified: Secondary | ICD-10-CM | POA: Diagnosis present

## 2022-10-18 DIAGNOSIS — Z992 Dependence on renal dialysis: Secondary | ICD-10-CM | POA: Diagnosis not present

## 2022-10-18 DIAGNOSIS — N261 Atrophy of kidney (terminal): Secondary | ICD-10-CM | POA: Diagnosis not present

## 2022-10-18 DIAGNOSIS — Z87891 Personal history of nicotine dependence: Secondary | ICD-10-CM | POA: Diagnosis not present

## 2022-10-18 DIAGNOSIS — Z9049 Acquired absence of other specified parts of digestive tract: Secondary | ICD-10-CM | POA: Diagnosis not present

## 2022-10-18 DIAGNOSIS — N2581 Secondary hyperparathyroidism of renal origin: Secondary | ICD-10-CM | POA: Diagnosis not present

## 2022-10-18 DIAGNOSIS — H548 Legal blindness, as defined in USA: Secondary | ICD-10-CM | POA: Diagnosis present

## 2022-10-18 DIAGNOSIS — L97822 Non-pressure chronic ulcer of other part of left lower leg with fat layer exposed: Secondary | ICD-10-CM | POA: Diagnosis present

## 2022-10-18 DIAGNOSIS — R109 Unspecified abdominal pain: Secondary | ICD-10-CM | POA: Diagnosis not present

## 2022-10-18 DIAGNOSIS — R079 Chest pain, unspecified: Secondary | ICD-10-CM | POA: Diagnosis not present

## 2022-10-18 DIAGNOSIS — K7460 Unspecified cirrhosis of liver: Secondary | ICD-10-CM | POA: Diagnosis present

## 2022-10-18 DIAGNOSIS — D57219 Sickle-cell/Hb-C disease with crisis, unspecified: Secondary | ICD-10-CM | POA: Diagnosis not present

## 2022-10-18 DIAGNOSIS — K31819 Angiodysplasia of stomach and duodenum without bleeding: Secondary | ICD-10-CM | POA: Diagnosis not present

## 2022-10-18 DIAGNOSIS — D57819 Other sickle-cell disorders with crisis, unspecified: Secondary | ICD-10-CM | POA: Diagnosis not present

## 2022-10-18 DIAGNOSIS — D649 Anemia, unspecified: Secondary | ICD-10-CM | POA: Diagnosis not present

## 2022-10-18 DIAGNOSIS — D6959 Other secondary thrombocytopenia: Secondary | ICD-10-CM | POA: Diagnosis present

## 2022-10-18 LAB — PREPARE RBC (CROSSMATCH)

## 2022-10-18 MED ORDER — SODIUM CHLORIDE 0.9% IV SOLUTION: Freq: Once | INTRAVENOUS | Status: AC

## 2022-10-18 NOTE — Progress Notes (Signed)
PATIENT CARE CENTER NOTE  Diagnosis: Anemia in CKD (D63.1)    Provider: Annie Sable, MD  Procedure: 1 unit PRBCS  Note: Patient received 1 Unit PRBC via PIV. Type and Screen sent prior to transfusion. Blood consent obtained and in pt chart. Pt tolerated infusion with no adverse reaction. AVS printed and given to pt. Pt is alert, oriented, and ambulatory at discharge.

## 2022-10-19 ENCOUNTER — Encounter (HOSPITAL_BASED_OUTPATIENT_CLINIC_OR_DEPARTMENT_OTHER): Payer: PPO | Attending: Internal Medicine | Admitting: Internal Medicine

## 2022-10-19 DIAGNOSIS — N186 End stage renal disease: Secondary | ICD-10-CM | POA: Diagnosis not present

## 2022-10-19 DIAGNOSIS — Z87891 Personal history of nicotine dependence: Secondary | ICD-10-CM | POA: Insufficient documentation

## 2022-10-19 DIAGNOSIS — I87312 Chronic venous hypertension (idiopathic) with ulcer of left lower extremity: Secondary | ICD-10-CM | POA: Insufficient documentation

## 2022-10-19 DIAGNOSIS — Z992 Dependence on renal dialysis: Secondary | ICD-10-CM | POA: Insufficient documentation

## 2022-10-19 DIAGNOSIS — L97822 Non-pressure chronic ulcer of other part of left lower leg with fat layer exposed: Secondary | ICD-10-CM | POA: Insufficient documentation

## 2022-10-19 DIAGNOSIS — H548 Legal blindness, as defined in USA: Secondary | ICD-10-CM | POA: Diagnosis not present

## 2022-10-19 DIAGNOSIS — D571 Sickle-cell disease without crisis: Secondary | ICD-10-CM | POA: Insufficient documentation

## 2022-10-19 DIAGNOSIS — J449 Chronic obstructive pulmonary disease, unspecified: Secondary | ICD-10-CM | POA: Diagnosis not present

## 2022-10-19 DIAGNOSIS — I5022 Chronic systolic (congestive) heart failure: Secondary | ICD-10-CM | POA: Diagnosis not present

## 2022-10-19 LAB — TYPE AND SCREEN
ABO/RH(D): AB POS
Antibody Screen: NEGATIVE
Unit division: 0

## 2022-10-19 LAB — BPAM RBC
Blood Product Expiration Date: 202405282359
ISSUE DATE / TIME: 202405021052
Unit Type and Rh: 6200

## 2022-10-20 ENCOUNTER — Inpatient Hospital Stay (HOSPITAL_COMMUNITY)
Admission: EM | Admit: 2022-10-20 | Discharge: 2022-10-23 | DRG: 811 | Disposition: A | Discharge status: Home / Self Care | Payer: PPO | Attending: Family Medicine | Admitting: Family Medicine

## 2022-10-20 ENCOUNTER — Emergency Department (HOSPITAL_COMMUNITY): Payer: PPO

## 2022-10-20 ENCOUNTER — Other Ambulatory Visit: Payer: Self-pay

## 2022-10-20 ENCOUNTER — Encounter (HOSPITAL_COMMUNITY): Payer: Self-pay | Admitting: *Deleted

## 2022-10-20 DIAGNOSIS — I5022 Chronic systolic (congestive) heart failure: Secondary | ICD-10-CM | POA: Diagnosis present

## 2022-10-20 DIAGNOSIS — I4819 Other persistent atrial fibrillation: Secondary | ICD-10-CM | POA: Diagnosis present

## 2022-10-20 DIAGNOSIS — K8689 Other specified diseases of pancreas: Secondary | ICD-10-CM | POA: Diagnosis present

## 2022-10-20 DIAGNOSIS — Z87891 Personal history of nicotine dependence: Secondary | ICD-10-CM

## 2022-10-20 DIAGNOSIS — Z1152 Encounter for screening for COVID-19: Secondary | ICD-10-CM

## 2022-10-20 DIAGNOSIS — N186 End stage renal disease: Secondary | ICD-10-CM

## 2022-10-20 DIAGNOSIS — I959 Hypotension, unspecified: Secondary | ICD-10-CM | POA: Insufficient documentation

## 2022-10-20 DIAGNOSIS — D62 Acute posthemorrhagic anemia: Secondary | ICD-10-CM | POA: Diagnosis present

## 2022-10-20 DIAGNOSIS — I4821 Permanent atrial fibrillation: Secondary | ICD-10-CM | POA: Diagnosis present

## 2022-10-20 DIAGNOSIS — D649 Anemia, unspecified: Secondary | ICD-10-CM

## 2022-10-20 DIAGNOSIS — D6959 Other secondary thrombocytopenia: Secondary | ICD-10-CM | POA: Diagnosis present

## 2022-10-20 DIAGNOSIS — E440 Moderate protein-calorie malnutrition: Secondary | ICD-10-CM | POA: Diagnosis present

## 2022-10-20 DIAGNOSIS — G894 Chronic pain syndrome: Secondary | ICD-10-CM | POA: Diagnosis present

## 2022-10-20 DIAGNOSIS — H548 Legal blindness, as defined in USA: Secondary | ICD-10-CM | POA: Diagnosis present

## 2022-10-20 DIAGNOSIS — Z681 Body mass index (BMI) 19 or less, adult: Secondary | ICD-10-CM

## 2022-10-20 DIAGNOSIS — Z992 Dependence on renal dialysis: Secondary | ICD-10-CM

## 2022-10-20 DIAGNOSIS — Z8711 Personal history of peptic ulcer disease: Secondary | ICD-10-CM

## 2022-10-20 DIAGNOSIS — M109 Gout, unspecified: Secondary | ICD-10-CM | POA: Diagnosis present

## 2022-10-20 DIAGNOSIS — K449 Diaphragmatic hernia without obstruction or gangrene: Secondary | ICD-10-CM | POA: Diagnosis present

## 2022-10-20 DIAGNOSIS — Z9049 Acquired absence of other specified parts of digestive tract: Secondary | ICD-10-CM

## 2022-10-20 DIAGNOSIS — K219 Gastro-esophageal reflux disease without esophagitis: Secondary | ICD-10-CM | POA: Diagnosis present

## 2022-10-20 DIAGNOSIS — D5700 Hb-SS disease with crisis, unspecified: Principal | ICD-10-CM | POA: Diagnosis present

## 2022-10-20 DIAGNOSIS — D571 Sickle-cell disease without crisis: Secondary | ICD-10-CM | POA: Diagnosis present

## 2022-10-20 DIAGNOSIS — K2901 Acute gastritis with bleeding: Secondary | ICD-10-CM | POA: Diagnosis present

## 2022-10-20 DIAGNOSIS — K7460 Unspecified cirrhosis of liver: Secondary | ICD-10-CM | POA: Insufficient documentation

## 2022-10-20 DIAGNOSIS — R0600 Dyspnea, unspecified: Secondary | ICD-10-CM

## 2022-10-20 DIAGNOSIS — Z79899 Other long term (current) drug therapy: Secondary | ICD-10-CM

## 2022-10-20 DIAGNOSIS — I9589 Other hypotension: Secondary | ICD-10-CM | POA: Diagnosis present

## 2022-10-20 DIAGNOSIS — D696 Thrombocytopenia, unspecified: Secondary | ICD-10-CM | POA: Insufficient documentation

## 2022-10-20 DIAGNOSIS — R188 Other ascites: Secondary | ICD-10-CM | POA: Diagnosis present

## 2022-10-20 DIAGNOSIS — J449 Chronic obstructive pulmonary disease, unspecified: Secondary | ICD-10-CM | POA: Diagnosis present

## 2022-10-20 LAB — CBC
HCT: 15.5 % — ABNORMAL LOW (ref 39.0–52.0)
Hemoglobin: 5.8 g/dL — CL (ref 13.0–17.0)
MCH: 40.3 pg — ABNORMAL HIGH (ref 26.0–34.0)
MCHC: 37.4 g/dL — ABNORMAL HIGH (ref 30.0–36.0)
MCV: 107.6 fL — ABNORMAL HIGH (ref 80.0–100.0)
Platelets: 84 10*3/uL — ABNORMAL LOW (ref 150–400)
RBC: 1.44 MIL/uL — ABNORMAL LOW (ref 4.22–5.81)
RDW: 23.1 % — ABNORMAL HIGH (ref 11.5–15.5)
WBC: 5.1 10*3/uL (ref 4.0–10.5)
nRBC: 9.2 % — ABNORMAL HIGH (ref 0.0–0.2)

## 2022-10-20 LAB — BPAM RBC: Blood Product Expiration Date: 202405302359

## 2022-10-20 LAB — COMPREHENSIVE METABOLIC PANEL
ALT: 58 U/L — ABNORMAL HIGH (ref 0–44)
AST: 65 U/L — ABNORMAL HIGH (ref 15–41)
Albumin: 3 g/dL — ABNORMAL LOW (ref 3.5–5.0)
Alkaline Phosphatase: 187 U/L — ABNORMAL HIGH (ref 38–126)
Anion gap: 15 (ref 5–15)
BUN: 40 mg/dL — ABNORMAL HIGH (ref 8–23)
CO2: 26 mmol/L (ref 22–32)
Calcium: 7.8 mg/dL — ABNORMAL LOW (ref 8.9–10.3)
Chloride: 94 mmol/L — ABNORMAL LOW (ref 98–111)
Creatinine, Ser: 5.93 mg/dL — ABNORMAL HIGH (ref 0.61–1.24)
GFR, Estimated: 10 mL/min — ABNORMAL LOW (ref >60)
Glucose, Bld: 98 mg/dL (ref 70–99)
Potassium: 4 mmol/L (ref 3.5–5.1)
Sodium: 135 mmol/L (ref 135–145)
Total Bilirubin: 1.6 mg/dL — ABNORMAL HIGH (ref 0.3–1.2)
Total Protein: 6.7 g/dL (ref 6.5–8.1)

## 2022-10-20 LAB — LACTIC ACID, PLASMA
Lactic Acid, Venous: 1.5 mmol/L (ref 0.5–1.9)
Lactic Acid, Venous: 1.1 mmol/L (ref 0.5–1.9)

## 2022-10-20 LAB — TYPE AND SCREEN
ABO/RH(D): AB POS
Antibody Screen: NEGATIVE

## 2022-10-20 LAB — POC OCCULT BLOOD, ED: Fecal Occult Bld: POSITIVE — AB

## 2022-10-20 LAB — TROPONIN I (HIGH SENSITIVITY)
Troponin I (High Sensitivity): 22 ng/L — ABNORMAL HIGH (ref <18)
Troponin I (High Sensitivity): 22 ng/L — ABNORMAL HIGH (ref <18)

## 2022-10-20 LAB — PREPARE RBC (CROSSMATCH)

## 2022-10-20 LAB — LIPASE, BLOOD: Lipase: 29 U/L (ref 11–51)

## 2022-10-20 MED ORDER — POLYETHYLENE GLYCOL 3350 17 G PO PACK: 17.0000 g | PACK | Freq: Every day | ORAL | Status: DC | PRN

## 2022-10-20 MED ORDER — PROCHLORPERAZINE EDISYLATE 10 MG/2ML IJ SOLN: 5.0000 mg | Freq: Four times a day (QID) | INTRAMUSCULAR | Status: DC | PRN

## 2022-10-20 MED ORDER — FENTANYL CITRATE PF 50 MCG/ML IJ SOSY: 50.0000 ug | PREFILLED_SYRINGE | Freq: Once | INTRAMUSCULAR | Status: DC

## 2022-10-20 MED ORDER — ALBUMIN HUMAN 25 % IV SOLN
25.0000 g | Freq: Four times a day (QID) | INTRAVENOUS | Status: DC
  Administered 2022-10-21 (×2): 25 g via INTRAVENOUS
  Filled 2022-10-20 (×2): qty 100

## 2022-10-20 MED ORDER — SODIUM CHLORIDE 0.9% IV SOLUTION: Freq: Once | INTRAVENOUS | Status: AC

## 2022-10-20 MED ORDER — HYDROMORPHONE HCL 1 MG/ML IJ SOLN
0.5000 mg | INTRAMUSCULAR | Status: DC | PRN
  Administered 2022-10-20: 0.5 mg via INTRAVENOUS
  Filled 2022-10-20: qty 1

## 2022-10-20 MED ORDER — PANTOPRAZOLE SODIUM 40 MG IV SOLR
40.0000 mg | Freq: Two times a day (BID) | INTRAVENOUS | Status: DC
  Administered 2022-10-20 – 2022-10-21 (×3): 40 mg via INTRAVENOUS
  Filled 2022-10-20 (×4): qty 10

## 2022-10-20 MED ORDER — SODIUM CHLORIDE 0.9 % IV SOLN
2.0000 g | INTRAVENOUS | Status: DC
  Administered 2022-10-20 – 2022-10-21 (×2): 2 g via INTRAVENOUS
  Filled 2022-10-20 (×2): qty 20

## 2022-10-20 MED ORDER — HYDROXYUREA 500 MG PO CAPS
500.0000 mg | ORAL_CAPSULE | Freq: Every day | ORAL | Status: DC
  Administered 2022-10-21 – 2022-10-23 (×3): 500 mg via ORAL
  Filled 2022-10-20 (×3): qty 1

## 2022-10-20 MED ORDER — HEPARIN SODIUM (PORCINE) 5000 UNIT/ML IJ SOLN: 5000.0000 [IU] | Freq: Three times a day (TID) | INTRAMUSCULAR | Status: DC

## 2022-10-20 MED ORDER — LACTULOSE 10 GM/15ML PO SOLN
10.0000 g | Freq: Two times a day (BID) | ORAL | Status: DC
  Administered 2022-10-20 – 2022-10-23 (×5): 10 g via ORAL
  Filled 2022-10-20 (×5): qty 15
  Filled 2022-10-20: qty 30

## 2022-10-20 MED ORDER — FOLIC ACID 1 MG PO TABS
1.0000 mg | ORAL_TABLET | Freq: Every day | ORAL | Status: DC
  Administered 2022-10-20 – 2022-10-23 (×4): 1 mg via ORAL
  Filled 2022-10-20 (×5): qty 1

## 2022-10-20 MED ORDER — MELATONIN 3 MG PO TABS: 3.0000 mg | ORAL_TABLET | Freq: Every evening | ORAL | Status: DC | PRN

## 2022-10-20 NOTE — ED Notes (Signed)
Dialysis fistula rt arm 

## 2022-10-20 NOTE — ED Notes (Signed)
POC occult blood test POSITIVE. Nurse notified, results downloading

## 2022-10-20 NOTE — ED Notes (Signed)
Patient transported to X-ray 

## 2022-10-20 NOTE — H&P (Signed)
History and Physical  Patrick [name]:[id number] DOB: [birth year] DOA: 10/20/2022  Referring physician: Dr. Freida Busman, EDP  PCP: Quentin Angst, MD  Outpatient Specialists: Sickle cell clinic Patient coming from: Home  Chief Complaint: Shortness of breath   HPI: Patrick [name] is a 67 y.o. male with medical history significant for ESRD on HD TTS, sickle cell disease, history of GI bleed, angiodysplasia of stomach and duodenum seen on EGD done on 08/10/2021, anemia of chronic disease, decompensated liver cirrhosis with ascites, permanent A-fib not on oral anticoagulation due to history of GI bleed, chronic HFrEF 35 to 40%, hypertension, COPD, history of gallbladder empyema status post cholecystostomy, biliary stent, and cholecystectomy, gout, who presented to [facility] ED from home due to worsening shortness of breath for the past 2 days.  Associated with chest pain, epigastric pain, and abdominal swelling.  States the pain is different than his sickle cell pain crisis.  The patient had 1 unit PRBCs transfused on Thursday 10/18/2022 at dialysis for hemoglobin of 6.  Last hemodialysis session was today 10/20/2022.  Due to his worsening shortness of breath that has progressed to chest pain, the patient presented to the ED for further evaluation.  In the ED, hypotensive with SBP less than 90.  Lab studies remarkable for hemoglobin 5.8K and platelet count of 84K.  FOBT positive.  2 units PRBCs ordered by EDP to be transfused.  The patient was admitted by [facility], hospitalist service, to telemetry medical unit as observation status.  Due to concern for possible upper GI bleed the patient was started on IV Protonix 40 mg twice daily and [facility] consulted.  Also started on Rocephin for SBP prophylaxis in a cirrhotic patient with suspected upper GI bleed.  ED Course: Tmax 98.4.  Lab studies notable for hemoglobin 5.8, hematocrit 15.5, MCV 107.6, platelet count 84, WBC 5.1.  BUN 40, creatinine 5.93.   Alkaline phosphatase 187, albumin 3.0, AST 65, ALT 58, T. bili 1.6.  High-sensitivity troponin 22 from 22.  Lactic acid 1.5.  Review of Systems: Review of systems as noted in the HPI. All other systems reviewed and are negative.   Past Medical History:  Diagnosis Date   A-fib (HCC)    Blood dyscrasia    Blood transfusion    "I've had a bunch of them"   CHF (congestive heart failure) (HCC)    followed by hf clinic   Chronic kidney disease    Stage 5, dialysis on Tu/Th/Sa   Dialysis patient (HCC) 04/2019   AV fistula (right arm), TTS   Dysrhythmia    A-Fib per patient   Early satiety    Elevated LFTs 04/01/2022   Elevated troponin 04/01/2022   Empyema of gallbladder 05/27/2021   GERD (gastroesophageal reflux disease)    Gout    Legally blind    Metabolic acidosis with increased anion gap and accumulation of organic acids 04/01/2022   Mitral regurgitation    Muscle tightness-Right arm  05/27/2014   Pneumonia    Pulmonary hypertension (HCC)    Shortness of breath dyspnea    Sickle cell disease, type SS (HCC)    Swelling abdomen    Swelling of extremity, left    Swelling of extremity, right    Tricuspid regurgitation    Past Surgical History:  Procedure Laterality Date   A/V FISTULAGRAM Right 07/29/2020   Procedure: A/V FISTULAGRAM - Right Arm;  Surgeon: Chuck Hint, MD;  Location: [facility] INVASIVE CV LAB;  Service: Cardiovascular;  Laterality: Right;  BASCILIC VEIN TRANSPOSITION Right 04/12/2014   Procedure: BASILIC VEIN TRANSPOSITION;  Surgeon: Sherren Kerns, MD;  Location: [facility] OR;  Service: Vascular;  Laterality: Right;   BILIARY STENT PLACEMENT  04/04/2022   Procedure: BILIARY STENT PLACEMENT;  Surgeon: Meryl Dare, MD;  Location: [facility] ENDOSCOPY;  Service: Gastroenterology;;   BIOPSY  08/04/2020   Procedure: BIOPSY;  Surgeon: Meryl Dare, MD;  Location: [facility] ENDOSCOPY;  Service: Gastroenterology;;   BIOPSY  06/28/2021   Procedure: BIOPSY;  Surgeon:  Beverley Fiedler, MD;  Location: [facility] ENDOSCOPY;  Service: Gastroenterology;;   BIOPSY  04/02/2022   Procedure: BIOPSY;  Surgeon: Lemar Lofty., MD;  Location: [facility] ENDOSCOPY;  Service: Gastroenterology;;   BOWEL RESECTION N/A 05/24/2021   Procedure: SMALL BOWEL RESECTION;  Surgeon: Emelia Loron, MD;  Location: [facility] OR;  Service: General;  Laterality: N/A;   CARDIOVERSION N/A 06/05/2018   Procedure: CARDIOVERSION;  Surgeon: Dolores Patty, MD;  Location: [facility] ENDOSCOPY;  Service: Cardiovascular;  Laterality: N/A;   CARDIOVERSION N/A 10/06/2019   Procedure: CARDIOVERSION;  Surgeon: Dolores Patty, MD;  Location: [facility] ENDOSCOPY;  Service: Cardiovascular;  Laterality: N/A;   CHOLECYSTECTOMY N/A 05/24/2021   Procedure: OPEN CHOLECYSTECTOMY;  Surgeon: Emelia Loron, MD;  Location: [facility] OR;  Service: General;  Laterality: N/A;   COLONOSCOPY WITH PROPOFOL N/A 09/24/2017   Procedure: COLONOSCOPY WITH PROPOFOL;  Surgeon: Lynann Bologna, MD;  Location: [facility] ENDOSCOPY;  Service: Endoscopy;  Laterality: N/A;   ENDOSCOPIC RETROGRADE CHOLANGIOPANCREATOGRAPHY (ERCP) WITH PROPOFOL N/A 05/17/2022   Procedure: ENDOSCOPIC RETROGRADE CHOLANGIOPANCREATOGRAPHY (ERCP) WITH PROPOFOL;  Surgeon: Meridee Score Netty Starring., MD;  Location: [facility] ENDOSCOPY;  Service: Gastroenterology;  Laterality: N/A;   ERCP N/A 04/04/2022   Procedure: ENDOSCOPIC RETROGRADE CHOLANGIOPANCREATOGRAPHY (ERCP);  Surgeon: Meryl Dare, MD;  Location: [facility] ENDOSCOPY;  Service: Gastroenterology;  Laterality: N/A;   ESOPHAGOGASTRODUODENOSCOPY N/A 09/19/2017   Procedure: ESOPHAGOGASTRODUODENOSCOPY (EGD);  Surgeon: Lynann Bologna, MD;  Location: [facility] ENDOSCOPY;  Service: Endoscopy;  Laterality: N/A;   ESOPHAGOGASTRODUODENOSCOPY N/A 05/29/2021   Procedure: ESOPHAGOGASTRODUODENOSCOPY (EGD);  Surgeon: Rachael Fee, MD;  Location: [facility] ENDOSCOPY;  Service: Gastroenterology;  Laterality: N/A;   ESOPHAGOGASTRODUODENOSCOPY (EGD) WITH PROPOFOL N/A  08/04/2020   Procedure: ESOPHAGOGASTRODUODENOSCOPY (EGD) WITH PROPOFOL;  Surgeon: Meryl Dare, MD;  Location: [facility] ENDOSCOPY;  Service: Gastroenterology;  Laterality: N/A;   ESOPHAGOGASTRODUODENOSCOPY (EGD) WITH PROPOFOL N/A 06/28/2021   Procedure: ESOPHAGOGASTRODUODENOSCOPY (EGD) WITH PROPOFOL;  Surgeon: Beverley Fiedler, MD;  Location: [facility] ENDOSCOPY;  Service: Gastroenterology;  Laterality: N/A;   ESOPHAGOGASTRODUODENOSCOPY (EGD) WITH PROPOFOL N/A 08/10/2021   Procedure: ESOPHAGOGASTRODUODENOSCOPY (EGD) WITH PROPOFOL;  Surgeon: Lynann Bologna, MD;  Location: [facility] ENDOSCOPY;  Service: Gastroenterology;  Laterality: N/A;   ESOPHAGOGASTRODUODENOSCOPY (EGD) WITH PROPOFOL N/A 08/02/2021   Procedure: ESOPHAGOGASTRODUODENOSCOPY (EGD) WITH PROPOFOL;  Surgeon: Shellia Cleverly, DO;  Location: [facility] ENDOSCOPY;  Service: Gastroenterology;  Laterality: N/A;   ESOPHAGOGASTRODUODENOSCOPY (EGD) WITH PROPOFOL N/A 04/02/2022   Procedure: ESOPHAGOGASTRODUODENOSCOPY (EGD) WITH PROPOFOL;  Surgeon: Meridee Score Netty Starring., MD;  Location: [facility] ENDOSCOPY;  Service: Gastroenterology;  Laterality: N/A;   ESOPHAGOGASTRODUODENOSCOPY (EGD) WITH PROPOFOL N/A 05/17/2022   Procedure: ESOPHAGOGASTRODUODENOSCOPY (EGD) WITH PROPOFOL;  Surgeon: Meridee Score Netty Starring., MD;  Location: [facility] ENDOSCOPY;  Service: Gastroenterology;  Laterality: N/A;   EUS N/A 05/17/2022   Procedure: UPPER ENDOSCOPIC ULTRASOUND (EUS) RADIAL;  Surgeon: Lemar Lofty., MD;  Location: [facility] ENDOSCOPY;  Service: Gastroenterology;  Laterality: N/A;   FINE NEEDLE ASPIRATION  04/02/2022   Procedure: FINE NEEDLE ASPIRATION (FNA) LINEAR;  Surgeon: Lemar Lofty., MD;  Location: [facility] ENDOSCOPY;  Service: Gastroenterology;;   FINE NEEDLE ASPIRATION N/A 05/17/2022   Procedure: FINE NEEDLE ASPIRATION (FNA) LINEAR;  Surgeon: Lemar Lofty., MD;  Location: [facility] ENDOSCOPY;  Service: Gastroenterology;  Laterality: N/A;   HEMOSTASIS CLIP PLACEMENT  05/29/2021    Procedure: HEMOSTASIS CLIP PLACEMENT;  Surgeon: Rachael Fee, MD;  Location: [facility] ENDOSCOPY;  Service: Gastroenterology;;   HOT HEMOSTASIS N/A 05/29/2021   Procedure: HOT HEMOSTASIS (ARGON PLASMA COAGULATION/BICAP);  Surgeon: Rachael Fee, MD;  Location: [facility] ENDOSCOPY;  Service: Gastroenterology;  Laterality: N/A;   HOT HEMOSTASIS N/A 06/28/2021   Procedure: HOT HEMOSTASIS (ARGON PLASMA COAGULATION/BICAP);  Surgeon: Beverley Fiedler, MD;  Location: [facility] ENDOSCOPY;  Service: Gastroenterology;  Laterality: N/A;   HOT HEMOSTASIS N/A 08/10/2021   Procedure: HOT HEMOSTASIS (ARGON PLASMA COAGULATION/BICAP);  Surgeon: Lynann Bologna, MD;  Location: [facility] ENDOSCOPY;  Service: Gastroenterology;  Laterality: N/A;   HOT HEMOSTASIS N/A 08/02/2021   Procedure: HOT HEMOSTASIS (ARGON PLASMA COAGULATION/BICAP);  Surgeon: Shellia Cleverly, DO;  Location: [facility] ENDOSCOPY;  Service: Gastroenterology;  Laterality: N/A;   IR PARACENTESIS  07/04/2022   IR PERC TUN PERIT CATH WO PORT S&I /IMAG  05/23/2021   IR REMOVAL TUN CV CATH W/O FL  06/10/2021   IR US GUIDE VASC ACCESS RIGHT  05/23/2021   LAPAROSCOPY N/A 05/24/2021   Procedure: LAPAROSCOPY DIAGNOSTIC;  Surgeon: Emelia Loron, MD;  Location: [facility] OR;  Service: General;  Laterality: N/A;   LAPAROTOMY N/A 05/24/2021   Procedure: EXPLORATORY LAPAROTOMY;  Surgeon: Emelia Loron, MD;  Location: [facility] OR;  Service: General;  Laterality: N/A;   LEFT AND RIGHT HEART CATHETERIZATION WITH CORONARY ANGIOGRAM N/A 06/11/2011   Procedure: LEFT AND RIGHT HEART CATHETERIZATION WITH CORONARY ANGIOGRAM;  Surgeon: Laurey Morale, MD;  Location: [facility] CATH LAB;  Service: Cardiovascular;  Laterality: N/A;   PERIPHERAL VASCULAR BALLOON ANGIOPLASTY Right 07/29/2020   Procedure: PERIPHERAL VASCULAR BALLOON ANGIOPLASTY;  Surgeon: Chuck Hint, MD;  Location: [facility] INVASIVE CV LAB;  Service: Cardiovascular;  Laterality: Right;  arm fistula   REMOVAL OF STONES  05/17/2022    Procedure: REMOVAL OF STONES;  Surgeon: Meridee Score Netty Starring., MD;  Location: [facility] ENDOSCOPY;  Service: Gastroenterology;;   REVISON OF ARTERIOVENOUS FISTULA Right 08/08/2020   Procedure: ANEURYSM EXCISION OF RIGHT UPPER EXTREMITY ARTERIOVENOUS FISTULA;  Surgeon: Chuck Hint, MD;  Location: [facility] OR;  Service: Vascular;  Laterality: Right;   REVISON OF ARTERIOVENOUS FISTULA Right 03/21/2022   Procedure: EXCISION OF ULCERATED SKIN OF RIGHT ARTERIOVENOUS FISTULA REVISION;  Surgeon: Cephus Shelling, MD;  Location: [facility] OR;  Service: Vascular;  Laterality: Right;   REVISON OF ARTERIOVENOUS FISTULA Right 07/02/2022   Procedure: EXCISION OF ULCERS RIGHT ARTERIOVENOUS FISTULA;  Surgeon: Chuck Hint, MD;  Location: [facility] OR;  Service: Vascular;  Laterality: Right;   SCLEROTHERAPY  05/29/2021   Procedure: Susa Day;  Surgeon: Rachael Fee, MD;  Location: [facility] ENDOSCOPY;  Service: Gastroenterology;;   Dennison Mascot  04/04/2022   Procedure: SPHINCTEROTOMY;  Surgeon: Meryl Dare, MD;  Location: [facility] ENDOSCOPY;  Service: Gastroenterology;;   Francine Graven REMOVAL  05/29/2021   Procedure: STENT REMOVAL;  Surgeon: Rachael Fee, MD;  Location: [facility] ENDOSCOPY;  Service: Gastroenterology;;   Francine Graven REMOVAL  05/17/2022   Procedure: STENT REMOVAL;  Surgeon: Lemar Lofty., MD;  Location: [facility] ENDOSCOPY;  Service: Gastroenterology;;   TEE WITHOUT CARDIOVERSION N/A 12/27/2015   Procedure: TRANSESOPHAGEAL ECHOCARDIOGRAM (TEE);  Surgeon: Quintella Reichert, MD;  Location: [facility] ENDOSCOPY;  Service: Cardiovascular;  Laterality:  N/A;   UPPER ESOPHAGEAL ENDOSCOPIC ULTRASOUND (EUS) N/A 04/02/2022   Procedure: UPPER ESOPHAGEAL ENDOSCOPIC ULTRASOUND (EUS);  Surgeon: Lemar Lofty., MD;  Location: [facility] ENDOSCOPY;  Service: Gastroenterology;  Laterality: N/A;    Social History:  reports that he quit smoking about 6 years ago. His smoking use included cigarettes. He has a 20.50 pack-year smoking  history. He has never used smokeless tobacco. He reports that he does not currently use alcohol after a past usage of about 3.0 standard drinks of alcohol per week. He reports that he does not use drugs.   No Known Allergies  Family History  Problem Relation Age of Onset   Alcohol abuse Mother    Liver disease Mother    Cirrhosis Mother    Heart attack Mother       Prior to Admission medications   Medication Sig Start Date End Date Taking? Authorizing Provider  allopurinol (ZYLOPRIM) 100 MG tablet TAKE 1 TABLET BY MOUTH EVERY DAY 07/02/22  Yes Paseda, Baird Kay, FNP  AURYXIA 1 GM 210 MG(Fe) tablet Take 210-420 mg by mouth See admin instructions. 420 mg with every meal, 210 mg with each snack. 02/11/21  Yes [provider]  cinacalcet (SENSIPAR) 30 MG tablet Take 1 tablet (30 mg total) by mouth Every Tuesday,Thursday,and Saturday with dialysis. 06/12/22  Yes Shahmehdi, Gemma Payor, MD  folic acid (FOLVITE) 1 MG tablet Take 1 tablet (1 mg total) by mouth daily. 06/04/22  Yes Massie Maroon, FNP  hydroxyurea (HYDREA) 500 MG capsule Take 1 capsule (500 mg total) by mouth daily. 06/01/22  Yes Jegede, [name], MD  Iron-Vitamins (GERITOL PO) Take 15 mLs by mouth daily.   Yes [provider]  lidocaine-prilocaine (EMLA) cream Apply 1 application. topically Every Tuesday,Thursday,and Saturday with dialysis. 09/08/19  Yes [provider]  Oxycodone HCl 10 MG TABS Take 1 tablet (10 mg total) by mouth every 6 (six) hours as needed. 10/12/22  Yes Massie Maroon, FNP  pantoprazole (PROTONIX) 40 MG tablet Take 1 tablet (40 mg total) by mouth daily. 05/17/22  Yes Mansouraty, Netty Starring., MD  tiZANidine (ZANAFLEX) 4 MG tablet TAKE 1/2 TABLET (2MG  TOTAL) BY MOUTH EVERY 8 HOURS AS NEEDED FOR MUSCLE SPASM 10/12/22  Yes Massie Maroon, FNP  Torsemide 40 MG TABS Take 40 mg by mouth in the morning and at bedtime. 09/26/22 10/26/22 Yes Ivonne Andrew, NP  acetaminophen (TYLENOL)  500 MG tablet Take 1,500 mg by mouth 3 (three) times daily as needed for mild pain, moderate pain, fever or headache.    [provider]  diphenhydramine-acetaminophen (TYLENOL PM) 25-500 MG TABS tablet Take 2 tablets by mouth at bedtime as needed (sleep/pain). 09/26/22   Ivonne Andrew, NP  Vitamin D, Ergocalciferol, (DRISDOL) 1.25 MG (50000 UNIT) CAPS capsule Take 1 capsule (50,000 Units total) by mouth once a week. Monday Patient not taking: Reported on 08/01/2022 04/08/22   Glade Lloyd, MD    Physical Exam: BP (!) 87/55   Pulse (!) 57   Temp 98.4 F (36.9 C)   Resp 11   Ht 5\' 11"  (1.803 m)   Wt 62.1 kg   SpO2 97%   BMI 19.09 kg/m   General: 67 y.o. year-old male well developed well nourished in no acute distress.  Alert and oriented x3. Cardiovascular: Regular rate and rhythm with no rubs or gallops.  No thyromegaly or JVD noted.  Trace lower extremity edema. 2/4 pulses in all 4 extremities. Respiratory: Clear to auscultation  with no wheezes or rales. Good inspiratory effort. Abdomen: Distended with epigastric tenderness with mild palpation.  Normal bowel sounds x4 quadrants. Muskuloskeletal: No cyanosis or clubbing noted bilaterally Neuro: CN II-XII intact, strength, sensation, reflexes Skin: No ulcerative lesions noted or rashes Psychiatry: Judgement and insight appear normal. Mood is appropriate for condition and setting          Labs on Admission:  Basic Metabolic Panel: Recent Labs  Lab 10/20/22 1709  NA 135  K 4.0  CL 94*  CO2 26  GLUCOSE 98  BUN 40*  CREATININE 5.93*  CALCIUM 7.8*   Liver Function Tests: Recent Labs  Lab 10/20/22 1709  AST 65*  ALT 58*  ALKPHOS 187*  BILITOT 1.6*  PROT 6.7  ALBUMIN 3.0*   Recent Labs  Lab 10/20/22 1709  LIPASE 29   No results for input(s): "AMMONIA" in the last 168 hours. CBC: Recent Labs  Lab 10/20/22 1700  WBC 5.1  HGB 5.8*  HCT 15.5*  MCV 107.6*  PLT 84*   Cardiac Enzymes: No results for  input(s): "CKTOTAL", "CKMB", "CKMBINDEX", "TROPONINI" in the last 168 hours.  BNP (last 3 results) Recent Labs    03/31/22 1427  BNP 3,312.5*    ProBNP (last 3 results) No results for input(s): "PROBNP" in the last 8760 hours.  CBG: No results for input(s): "GLUCAP" in the last 168 hours.  Radiological Exams on Admission: CT ABDOMEN PELVIS WO CONTRAST  Result Date: 10/20/2022 CLINICAL DATA:  Acute nonlocalized abdominal pain EXAM: CT ABDOMEN AND PELVIS WITHOUT CONTRAST TECHNIQUE: Multidetector CT imaging of the abdomen and pelvis was performed following the standard protocol without IV contrast. RADIATION DOSE REDUCTION: This exam was performed according to the departmental dose-optimization program which includes automated exposure control, adjustment of the mA and/or kV according to patient size and/or use of iterative reconstruction technique. COMPARISON:  CT abdomen and pelvis 07/03/2022 FINDINGS: Lower chest: No acute abnormality. Cardiomegaly. Trace right pleural effusion. Hepatobiliary: Cirrhotic liver morphology. Cholecystectomy. Small amount of pneumobilia. No biliary dilation. Pancreas: Pancreatic atrophy. No ductal dilation or acute inflammation. Spleen: Calcified atrophic spleen. Adrenals/Urinary Tract: Normal adrenal glands. No urinary calculi or hydronephrosis. Nondistended bladder. Stomach/Bowel: Normal caliber large and small bowel. Postoperative changes about the cecum. Normal appendix. Stomach is within normal limits. Vascular/Lymphatic: Aortic atherosclerotic calcification. No definite abdominopelvic lymphadenopathy. Reproductive: Unremarkable. Other: There is increased ascites within the lesser sac although the overall volume of abdominopelvic ascites is decreased compared with 07/03/2022. No free intraperitoneal air. Musculoskeletal: Heterogenous marrow could represent renal osteodystrophy or sequela of sickle cell anemia. No acute fracture. IMPRESSION: 1. Cirrhosis. Increased  ascites within the lesser sac although the overall volume of abdominopelvic ascites is decreased compared with 07/03/2022. 2. Cardiomegaly and trace right pleural effusion. Aortic Atherosclerosis (ICD10-I70.0). Electronically Signed   By: Minerva Fester M.D.   On: 10/20/2022 17:32   DG Chest 2 View  Result Date: 10/20/2022 CLINICAL DATA:  Chest pain, short of breath, sickle cell disease EXAM: CHEST - 2 VIEW COMPARISON:  07/23/2022 FINDINGS: Frontal and lateral views of the chest demonstrates stable enlargement of the cardiac silhouette. Diffuse increased interstitial prominence throughout the lungs likely reflects scarring, and is stable since prior study. No acute airspace disease, effusion, or pneumothorax. There are no acute bony abnormalities. IMPRESSION: 1. Chronic interstitial scarring throughout the lungs. No acute airspace disease. 2. Stable enlarged cardiac silhouette. Electronically Signed   By: Sharlet Salina M.D.   On: 10/20/2022 16:46    EKG: I independently viewed  the EKG done and my findings are as followed: Atrial fibrillation with slow ventricular response.  Rate of 58.  Nonspecific ST-T changes.  QTc 467.  Assessment/Plan Present on Admission:  Symptomatic anemia  Active Problems:   Symptomatic anemia  Symptomatic anemia Acute blood loss anemia with history of GI bleed, decompensated liver cirrhosis, and sickle cell disease. Presented with shortness of breath that has progressed to chest pain Hemoglobin 5.8 on presentation, recent PRBCs transfusion 1 unit on 10/18/2022 at hemodialysis center for hemoglobin of 6.0. Positive FOBT in the ED on 10/20/2022 History of angiodysplasia of stomach and duodenum, angioectasias at the GE junction and proximal gastric body along the lesser curvature, treated with APC seen on EGD done on 08/10/2021. IV Protonix 40 mg twice daily Rocephin for SBP prophylaxis [facility] consulted, will be made n.p.o. after midnight, octreotide if history of  varices. Repeat CBC post blood transfusion.  Chest pain, shortness of breath, suspect from symptomatic anemia High-sensitivity troponin flat 22, repeat 22 No evidence of acute ischemia on twelve-lead EKG Closely monitor on telemetry  Epigastric pain, decompensated liver cirrhosis with ascites Rule out gastritis or other upper abdominal pathologies IR thoracentesis ordered for diagnostic and therapeutic purposes Follow ascitic fluid results Rule out SBP Currently on SBP prophylaxis with IV Rocephin. IV albumin 25 g every 6 hours x 3 doses [facility] consulted.  ESRD on HD TTS Last hemodialysis session on 10/20/2022. Volume status and electrolytes managed with hemodialysis.  Sickle cell disease Does not appear to be in crisis. States this pain is different from his usual sickle cell pain crisis. Resume home regimen.  Chronic hypotension Maintain MAP greater than 65 IV albumin Consider midodrine if MAP of 65 is not achieved with IV albumin. Continue to closely monitor vital signs  Thrombocytopenia in the setting of cirrhosis Platelet count greater than 80 No need for platelet transfusion at this time. Continue to monitor  Elevated liver chemistries in the setting of decompensated cirrhosis with ascites LFTs are elevated, T. bili 1.6. Lactulose with goal of 3 stools per day to avoid hepatic encephalopathy Avoid hepatotoxic agents Monitor LFTs.  Moderate protein calorie malnutrition Albumin 3.0 BMI 19 Recommend to increase protein calorie intake as tolerated.    DVT prophylaxis: SCDs due to possible upper GI bleed.  Code Status: Full code as stated by the patient himself.  Family Communication: None at bedside  Disposition Plan: Admitted to telemetry medical unit  Consults called: [facility].  Admission status: Observation status.   Status is: Observation    Darlin Drop MD [facility] Pager [phone]  If 7PM-7AM, please contact  night-coverage [email or web address] Password [email or web address]  10/20/2022, 8:45 PM

## 2022-10-20 NOTE — ED Triage Notes (Signed)
The pt is having sob and chest pain with weakness for a week  he has sickle cell and he had a blood transfusion last week for a hgb of 6

## 2022-10-20 NOTE — ED Notes (Signed)
Pt care taken, no complaints at this time. 

## 2022-10-20 NOTE — ED Notes (Signed)
..ED TO INPATIENT HANDOFF REPORT  ED Nurse Name and Phone #: [phone]  S Name/Age/Gender Patrick [name] 67 y.o. male Room/Bed: 028C/028C  Code Status   Code Status: Full Code  Home/SNF/Other Home Patient oriented to: self, place, time, and situation Is this baseline? Yes   Triage Complete: Triage complete  Chief Complaint Symptomatic anemia [D64.9]  Triage Note The pt is having sob and chest pain with weakness for a week  he has sickle cell and he had a blood transfusion last week for a hgb of 6    Allergies No Known Allergies  Level of Care/Admitting Diagnosis ED Disposition     ED Disposition  Admit   Condition  --   Comment  Hospital Area: [facility] [[id number]]  Level of Care: Telemetry Medical [104]  May place patient in observation at [facility] or [facility] if equivalent level of care is available:: No  Covid Evaluation: Asymptomatic - no recent exposure (last 10 days) testing not required  Diagnosis: Symptomatic anemia [9811914]  Admitting Physician: Darlin Drop [[id number]]  Attending Physician: Darlin Drop [[id number]]          B Medical/Surgery History Past Medical History:  Diagnosis Date   A-fib (HCC)    Blood dyscrasia    Blood transfusion    "I've had a bunch of them"   CHF (congestive heart failure) (HCC)    followed by hf clinic   Chronic kidney disease    Stage 5, dialysis on Tu/Th/Sa   Dialysis patient (HCC) 04/2019   AV fistula (right arm), TTS   Dysrhythmia    A-Fib per patient   Early satiety    Elevated LFTs 04/01/2022   Elevated troponin 04/01/2022   Empyema of gallbladder 05/27/2021   GERD (gastroesophageal reflux disease)    Gout    Legally blind    Metabolic acidosis with increased anion gap and accumulation of organic acids 04/01/2022   Mitral regurgitation    Muscle tightness-Right arm  05/27/2014   Pneumonia    Pulmonary hypertension (HCC)    Shortness of breath dyspnea    Sickle cell  disease, type SS (HCC)    Swelling abdomen    Swelling of extremity, left    Swelling of extremity, right    Tricuspid regurgitation    Past Surgical History:  Procedure Laterality Date   A/V FISTULAGRAM Right 07/29/2020   Procedure: A/V FISTULAGRAM - Right Arm;  Surgeon: Chuck Hint, MD;  Location: [facility] INVASIVE CV LAB;  Service: Cardiovascular;  Laterality: Right;   BASCILIC VEIN TRANSPOSITION Right 04/12/2014   Procedure: BASILIC VEIN TRANSPOSITION;  Surgeon: Sherren Kerns, MD;  Location: [facility] OR;  Service: Vascular;  Laterality: Right;   BILIARY STENT PLACEMENT  04/04/2022   Procedure: BILIARY STENT PLACEMENT;  Surgeon: Meryl Dare, MD;  Location: [facility] ENDOSCOPY;  Service: Gastroenterology;;   BIOPSY  08/04/2020   Procedure: BIOPSY;  Surgeon: Meryl Dare, MD;  Location: [facility] ENDOSCOPY;  Service: Gastroenterology;;   BIOPSY  06/28/2021   Procedure: BIOPSY;  Surgeon: Beverley Fiedler, MD;  Location: [facility] ENDOSCOPY;  Service: Gastroenterology;;   BIOPSY  04/02/2022   Procedure: BIOPSY;  Surgeon: Lemar Lofty., MD;  Location: [facility] ENDOSCOPY;  Service: Gastroenterology;;   BOWEL RESECTION N/A 05/24/2021   Procedure: SMALL BOWEL RESECTION;  Surgeon: Emelia Loron, MD;  Location: [facility] OR;  Service: General;  Laterality: N/A;   CARDIOVERSION N/A 06/05/2018   Procedure: CARDIOVERSION;  Surgeon: Dolores Patty, MD;  Location: [facility] ENDOSCOPY;  Service: Cardiovascular;  Laterality: N/A;   CARDIOVERSION N/A 10/06/2019   Procedure: CARDIOVERSION;  Surgeon: Dolores Patty, MD;  Location: [facility] ENDOSCOPY;  Service: Cardiovascular;  Laterality: N/A;   CHOLECYSTECTOMY N/A 05/24/2021   Procedure: OPEN CHOLECYSTECTOMY;  Surgeon: Emelia Loron, MD;  Location: [facility] OR;  Service: General;  Laterality: N/A;   COLONOSCOPY WITH PROPOFOL N/A 09/24/2017   Procedure: COLONOSCOPY WITH PROPOFOL;  Surgeon: Lynann Bologna, MD;  Location: [facility] ENDOSCOPY;  Service: Endoscopy;  Laterality:  N/A;   ENDOSCOPIC RETROGRADE CHOLANGIOPANCREATOGRAPHY (ERCP) WITH PROPOFOL N/A 05/17/2022   Procedure: ENDOSCOPIC RETROGRADE CHOLANGIOPANCREATOGRAPHY (ERCP) WITH PROPOFOL;  Surgeon: Meridee Score Netty Starring., MD;  Location: [facility] ENDOSCOPY;  Service: Gastroenterology;  Laterality: N/A;   ERCP N/A 04/04/2022   Procedure: ENDOSCOPIC RETROGRADE CHOLANGIOPANCREATOGRAPHY (ERCP);  Surgeon: Meryl Dare, MD;  Location: [facility] ENDOSCOPY;  Service: Gastroenterology;  Laterality: N/A;   ESOPHAGOGASTRODUODENOSCOPY N/A 09/19/2017   Procedure: ESOPHAGOGASTRODUODENOSCOPY (EGD);  Surgeon: Lynann Bologna, MD;  Location: [facility] ENDOSCOPY;  Service: Endoscopy;  Laterality: N/A;   ESOPHAGOGASTRODUODENOSCOPY N/A 05/29/2021   Procedure: ESOPHAGOGASTRODUODENOSCOPY (EGD);  Surgeon: Rachael Fee, MD;  Location: [facility] ENDOSCOPY;  Service: Gastroenterology;  Laterality: N/A;   ESOPHAGOGASTRODUODENOSCOPY (EGD) WITH PROPOFOL N/A 08/04/2020   Procedure: ESOPHAGOGASTRODUODENOSCOPY (EGD) WITH PROPOFOL;  Surgeon: Meryl Dare, MD;  Location: [facility] ENDOSCOPY;  Service: Gastroenterology;  Laterality: N/A;   ESOPHAGOGASTRODUODENOSCOPY (EGD) WITH PROPOFOL N/A 06/28/2021   Procedure: ESOPHAGOGASTRODUODENOSCOPY (EGD) WITH PROPOFOL;  Surgeon: Beverley Fiedler, MD;  Location: [facility] ENDOSCOPY;  Service: Gastroenterology;  Laterality: N/A;   ESOPHAGOGASTRODUODENOSCOPY (EGD) WITH PROPOFOL N/A 08/10/2021   Procedure: ESOPHAGOGASTRODUODENOSCOPY (EGD) WITH PROPOFOL;  Surgeon: Lynann Bologna, MD;  Location: [facility] ENDOSCOPY;  Service: Gastroenterology;  Laterality: N/A;   ESOPHAGOGASTRODUODENOSCOPY (EGD) WITH PROPOFOL N/A 08/02/2021   Procedure: ESOPHAGOGASTRODUODENOSCOPY (EGD) WITH PROPOFOL;  Surgeon: Shellia Cleverly, DO;  Location: [facility] ENDOSCOPY;  Service: Gastroenterology;  Laterality: N/A;   ESOPHAGOGASTRODUODENOSCOPY (EGD) WITH PROPOFOL N/A 04/02/2022   Procedure: ESOPHAGOGASTRODUODENOSCOPY (EGD) WITH PROPOFOL;  Surgeon: Meridee Score Netty Starring., MD;   Location: [facility] ENDOSCOPY;  Service: Gastroenterology;  Laterality: N/A;   ESOPHAGOGASTRODUODENOSCOPY (EGD) WITH PROPOFOL N/A 05/17/2022   Procedure: ESOPHAGOGASTRODUODENOSCOPY (EGD) WITH PROPOFOL;  Surgeon: Meridee Score Netty Starring., MD;  Location: [facility] ENDOSCOPY;  Service: Gastroenterology;  Laterality: N/A;   EUS N/A 05/17/2022   Procedure: UPPER ENDOSCOPIC ULTRASOUND (EUS) RADIAL;  Surgeon: Lemar Lofty., MD;  Location: [facility] ENDOSCOPY;  Service: Gastroenterology;  Laterality: N/A;   FINE NEEDLE ASPIRATION  04/02/2022   Procedure: FINE NEEDLE ASPIRATION (FNA) LINEAR;  Surgeon: Lemar Lofty., MD;  Location: [facility] ENDOSCOPY;  Service: Gastroenterology;;   FINE NEEDLE ASPIRATION N/A 05/17/2022   Procedure: FINE NEEDLE ASPIRATION (FNA) LINEAR;  Surgeon: Lemar Lofty., MD;  Location: [facility] ENDOSCOPY;  Service: Gastroenterology;  Laterality: N/A;   HEMOSTASIS CLIP PLACEMENT  05/29/2021   Procedure: HEMOSTASIS CLIP PLACEMENT;  Surgeon: Rachael Fee, MD;  Location: [facility] ENDOSCOPY;  Service: Gastroenterology;;   HOT HEMOSTASIS N/A 05/29/2021   Procedure: HOT HEMOSTASIS (ARGON PLASMA COAGULATION/BICAP);  Surgeon: Rachael Fee, MD;  Location: [facility] ENDOSCOPY;  Service: Gastroenterology;  Laterality: N/A;   HOT HEMOSTASIS N/A 06/28/2021   Procedure: HOT HEMOSTASIS (ARGON PLASMA COAGULATION/BICAP);  Surgeon: Beverley Fiedler, MD;  Location: [facility] ENDOSCOPY;  Service: Gastroenterology;  Laterality: N/A;   HOT HEMOSTASIS N/A 08/10/2021   Procedure: HOT HEMOSTASIS (ARGON PLASMA COAGULATION/BICAP);  Surgeon: Lynann Bologna, MD;  Location: [facility] ENDOSCOPY;  Service: Gastroenterology;  Laterality: N/A;   HOT HEMOSTASIS N/A 08/02/2021   Procedure:  HOT HEMOSTASIS (ARGON PLASMA COAGULATION/BICAP);  Surgeon: Shellia Cleverly, DO;  Location: [facility] ENDOSCOPY;  Service: Gastroenterology;  Laterality: N/A;   IR PARACENTESIS  07/04/2022   IR PERC TUN PERIT CATH WO PORT S&I /IMAG  05/23/2021   IR REMOVAL TUN CV  CATH W/O FL  06/10/2021   IR US GUIDE VASC ACCESS RIGHT  05/23/2021   LAPAROSCOPY N/A 05/24/2021   Procedure: LAPAROSCOPY DIAGNOSTIC;  Surgeon: Emelia Loron, MD;  Location: [facility] OR;  Service: General;  Laterality: N/A;   LAPAROTOMY N/A 05/24/2021   Procedure: EXPLORATORY LAPAROTOMY;  Surgeon: Emelia Loron, MD;  Location: [facility] OR;  Service: General;  Laterality: N/A;   LEFT AND RIGHT HEART CATHETERIZATION WITH CORONARY ANGIOGRAM N/A 06/11/2011   Procedure: LEFT AND RIGHT HEART CATHETERIZATION WITH CORONARY ANGIOGRAM;  Surgeon: Laurey Morale, MD;  Location: [facility] CATH LAB;  Service: Cardiovascular;  Laterality: N/A;   PERIPHERAL VASCULAR BALLOON ANGIOPLASTY Right 07/29/2020   Procedure: PERIPHERAL VASCULAR BALLOON ANGIOPLASTY;  Surgeon: Chuck Hint, MD;  Location: [facility] INVASIVE CV LAB;  Service: Cardiovascular;  Laterality: Right;  arm fistula   REMOVAL OF STONES  05/17/2022   Procedure: REMOVAL OF STONES;  Surgeon: Meridee Score Netty Starring., MD;  Location: [facility] ENDOSCOPY;  Service: Gastroenterology;;   REVISON OF ARTERIOVENOUS FISTULA Right 08/08/2020   Procedure: ANEURYSM EXCISION OF RIGHT UPPER EXTREMITY ARTERIOVENOUS FISTULA;  Surgeon: Chuck Hint, MD;  Location: [facility] OR;  Service: Vascular;  Laterality: Right;   REVISON OF ARTERIOVENOUS FISTULA Right 03/21/2022   Procedure: EXCISION OF ULCERATED SKIN OF RIGHT ARTERIOVENOUS FISTULA REVISION;  Surgeon: Cephus Shelling, MD;  Location: [facility] OR;  Service: Vascular;  Laterality: Right;   REVISON OF ARTERIOVENOUS FISTULA Right 07/02/2022   Procedure: EXCISION OF ULCERS RIGHT ARTERIOVENOUS FISTULA;  Surgeon: Chuck Hint, MD;  Location: [facility] OR;  Service: Vascular;  Laterality: Right;   SCLEROTHERAPY  05/29/2021   Procedure: Susa Day;  Surgeon: Rachael Fee, MD;  Location: [facility] ENDOSCOPY;  Service: Gastroenterology;;   Dennison Mascot  04/04/2022   Procedure: SPHINCTEROTOMY;  Surgeon: Meryl Dare, MD;   Location: [facility] ENDOSCOPY;  Service: Gastroenterology;;   Francine Graven REMOVAL  05/29/2021   Procedure: STENT REMOVAL;  Surgeon: Rachael Fee, MD;  Location: [facility] ENDOSCOPY;  Service: Gastroenterology;;   Francine Graven REMOVAL  05/17/2022   Procedure: STENT REMOVAL;  Surgeon: Lemar Lofty., MD;  Location: [facility] ENDOSCOPY;  Service: Gastroenterology;;   TEE WITHOUT CARDIOVERSION N/A 12/27/2015   Procedure: TRANSESOPHAGEAL ECHOCARDIOGRAM (TEE);  Surgeon: Quintella Reichert, MD;  Location: [facility] ENDOSCOPY;  Service: Cardiovascular;  Laterality: N/A;   UPPER ESOPHAGEAL ENDOSCOPIC ULTRASOUND (EUS) N/A 04/02/2022   Procedure: UPPER ESOPHAGEAL ENDOSCOPIC ULTRASOUND (EUS);  Surgeon: Lemar Lofty., MD;  Location: [facility] ENDOSCOPY;  Service: Gastroenterology;  Laterality: N/A;     A IV Location/Drains/Wounds Patient Lines/Drains/Airways Status     Active Line/Drains/Airways     Name Placement date Placement time Site Days   Peripheral IV 10/20/22 20 G 1" Anterior;Left Forearm 10/20/22  1800  Forearm  less than 1   Peripheral IV 10/20/22 22 G 1" Left;Posterior Forearm 10/20/22  2247  Forearm  less than 1   Fistula / Graft Right Upper arm Arteriovenous fistula 08/01/21  0354  Upper arm  445            Intake/Output Last 24 hours No intake or output data in the 24 hours ending 10/20/22 2306  Labs/Imaging Results for orders placed or performed during the hospital encounter of 10/20/22 (from the past  48 hour(s))  Type and screen [facility]     Status: None (Preliminary result)   Collection Time: 10/20/22  4:12 PM  Result Value Ref Range   ABO/RH(D) AB POS    Antibody Screen NEG    Sample Expiration 10/23/2022,2359    Unit Number [id number]    Blood Component Type RED CELLS,LR    Unit division 00    Status of Unit ISSUED    Transfusion Status OK TO TRANSFUSE    Crossmatch Result      Compatible Performed at [facility], [address], [place], [place] [address]     Unit Number [id number]    Blood Component Type RED CELLS,LR    Unit division 00    Status of Unit ALLOCATED    Transfusion Status OK TO TRANSFUSE    Crossmatch Result Compatible   CBC     Status: Abnormal   Collection Time: 10/20/22  5:00 PM  Result Value Ref Range   WBC 5.1 4.0 - 10.5 K/uL   RBC 1.44 (L) 4.22 - 5.81 MIL/uL   Hemoglobin 5.8 (LL) 13.0 - 17.0 g/dL    Comment: REPEATED TO VERIFY THIS CRITICAL RESULT HAS VERIFIED AND BEEN CALLED TO T DEL ROSSO RN BY DANIELLE [facility] ON 05 04 2024 AT 1758, AND HAS BEEN READ BACK.     HCT 15.5 (L) 39.0 - 52.0 %   MCV 107.6 (H) 80.0 - 100.0 fL   MCH 40.3 (H) 26.0 - 34.0 pg   MCHC 37.4 (H) 30.0 - 36.0 g/dL   RDW 78.4 (H) 69.6 - 29.5 %   Platelets 84 (L) 150 - 400 K/uL    Comment: Immature Platelet Fraction may be clinically indicated, consider ordering this additional test [id number] REPEATED TO VERIFY PLATELET COUNT CONFIRMED BY SMEAR    nRBC 9.2 (H) 0.0 - 0.2 %    Comment: Performed at [facility], [address], [place], [place] [address]  Lactic acid, plasma     Status: None   Collection Time: 10/20/22  5:00 PM  Result Value Ref Range   Lactic Acid, Venous 1.5 0.5 - 1.9 mmol/L    Comment: Performed at [facility], [address], [place], [place] [address]  Lipase, blood     Status: None   Collection Time: 10/20/22  5:09 PM  Result Value Ref Range   Lipase 29 11 - 51 U/L    Comment: Performed at [facility], [address] [place], [place] [address]  Comprehensive metabolic panel     Status: Abnormal   Collection Time: 10/20/22  5:09 PM  Result Value Ref Range   Sodium 135 135 - 145 mmol/L   Potassium 4.0 3.5 - 5.1 mmol/L   Chloride 94 (L) 98 - 111 mmol/L   CO2 26 22 - 32 mmol/L   Glucose, Bld 98 70 - 99 mg/dL    Comment: Glucose reference range applies only to samples taken after fasting for at least 8 hours.   BUN 40 (H) 8 - 23 mg/dL   Creatinine, Ser 7.42 (H) 0.61 - 1.24 mg/dL   Calcium 7.8 (L)  8.9 - 10.3 mg/dL   Total Protein 6.7 6.5 - 8.1 g/dL   Albumin 3.0 (L) 3.5 - 5.0 g/dL   AST 65 (H) 15 - 41 U/L   ALT 58 (H) 0 - 44 U/L   Alkaline Phosphatase 187 (H) 38 - 126 U/L   Total Bilirubin 1.6 (  H) 0.3 - 1.2 mg/dL   GFR, Estimated 10 (L) >60 mL/min    Comment: (NOTE) Calculated using the CKD-EPI Creatinine Equation (2021)    Anion gap 15 5 - 15    Comment: Performed at [facility], [address] [place], [place] [address]  Troponin I (High Sensitivity)     Status: Abnormal   Collection Time: 10/20/22  5:09 PM  Result Value Ref Range   Troponin I (High Sensitivity) 22 (H) <18 ng/L    Comment: (NOTE) Elevated high sensitivity troponin I (hsTnI) values and significant  changes across serial measurements may suggest ACS but many other  chronic and acute conditions are known to elevate hsTnI results.  Refer to the "Links" section for chest pain algorithms and additional  guidance. Performed at [facility], [address], [place], [place] [address]   Troponin I (High Sensitivity)     Status: Abnormal   Collection Time: 10/20/22  5:57 PM  Result Value Ref Range   Troponin I (High Sensitivity) 22 (H) <18 ng/L    Comment: (NOTE) Elevated high sensitivity troponin I (hsTnI) values and significant  changes across serial measurements may suggest ACS but many other  chronic and acute conditions are known to elevate hsTnI results.  Refer to the "Links" section for chest pain algorithms and additional  guidance. Performed at [facility], [address] [place], [place] [address]   Lactic acid, plasma     Status: None   Collection Time: 10/20/22  7:35 PM  Result Value Ref Range   Lactic Acid, Venous 1.1 0.5 - 1.9 mmol/L    Comment: Performed at [facility], [address], [place], [place] [address]  Prepare RBC (crossmatch)     Status: None   Collection Time: 10/20/22  7:58 PM  Result Value Ref Range   Order Confirmation      ORDER PROCESSED BY  BLOOD BANK Performed at [facility], [address], [place], [place] [address]   POC occult blood, ED RN will collect     Status: Abnormal   Collection Time: 10/20/22  8:45 PM  Result Value Ref Range   Fecal Occult Bld POSITIVE (A) NEGATIVE   *Note: Due to a large number of results and/or encounters for the requested time period, some results have not been displayed. A complete set of results can be found in Results Review.   CT ABDOMEN PELVIS WO CONTRAST  Result Date: 10/20/2022 CLINICAL DATA:  Acute nonlocalized abdominal pain EXAM: CT ABDOMEN AND PELVIS WITHOUT CONTRAST TECHNIQUE: Multidetector CT imaging of the abdomen and pelvis was performed following the standard protocol without IV contrast. RADIATION DOSE REDUCTION: This exam was performed according to the departmental dose-optimization program which includes automated exposure control, adjustment of the mA and/or kV according to patient size and/or use of iterative reconstruction technique. COMPARISON:  CT abdomen and pelvis 07/03/2022 FINDINGS: Lower chest: No acute abnormality. Cardiomegaly. Trace right pleural effusion. Hepatobiliary: Cirrhotic liver morphology. Cholecystectomy. Small amount of pneumobilia. No biliary dilation. Pancreas: Pancreatic atrophy. No ductal dilation or acute inflammation. Spleen: Calcified atrophic spleen. Adrenals/Urinary Tract: Normal adrenal glands. No urinary calculi or hydronephrosis. Nondistended bladder. Stomach/Bowel: Normal caliber large and small bowel. Postoperative changes about the cecum. Normal appendix. Stomach is within normal limits. Vascular/Lymphatic: Aortic atherosclerotic calcification. No definite abdominopelvic lymphadenopathy. Reproductive: Unremarkable. Other: There is increased ascites within the lesser sac although the overall volume of abdominopelvic ascites is decreased compared with 07/03/2022.  No free intraperitoneal air. Musculoskeletal: Heterogenous marrow could represent  renal osteodystrophy or sequela of sickle cell anemia. No acute fracture. IMPRESSION: 1. Cirrhosis. Increased ascites within the lesser sac although the overall volume of abdominopelvic ascites is decreased compared with 07/03/2022. 2. Cardiomegaly and trace right pleural effusion. Aortic Atherosclerosis (ICD10-I70.0). Electronically Signed   By: Minerva Fester M.D.   On: 10/20/2022 17:32   DG Chest 2 View  Result Date: 10/20/2022 CLINICAL DATA:  Chest pain, short of breath, sickle cell disease EXAM: CHEST - 2 VIEW COMPARISON:  07/23/2022 FINDINGS: Frontal and lateral views of the chest demonstrates stable enlargement of the cardiac silhouette. Diffuse increased interstitial prominence throughout the lungs likely reflects scarring, and is stable since prior study. No acute airspace disease, effusion, or pneumothorax. There are no acute bony abnormalities. IMPRESSION: 1. Chronic interstitial scarring throughout the lungs. No acute airspace disease. 2. Stable enlarged cardiac silhouette. Electronically Signed   By: Sharlet Salina M.D.   On: 10/20/2022 16:46    Pending Labs Unresulted Labs (From admission, onward)     Start     Ordered   10/21/22 0500  CBC  Tomorrow morning,   R        10/20/22 2050   10/21/22 0500  Comprehensive metabolic panel  Tomorrow morning,   R        10/20/22 2050   10/21/22 0500  Magnesium  Tomorrow morning,   R        10/20/22 2050   10/21/22 0500  Phosphorus  Tomorrow morning,   R        10/20/22 2050            Vitals/Pain Today's Vitals   10/20/22 2000 10/20/22 2030 10/20/22 2226 10/20/22 2241  BP: (!) 84/57 (!) 87/55 (!) 79/47 (!) 87/67  Pulse: (!) 59 (!) 57 64 76  Resp: 18 11 18 18   Temp:   97.6 F (36.4 C) 97.8 F (36.6 C)  TempSrc:   Oral Oral  SpO2: 100% 97%  100%  Weight:      Height:      PainSc:    6     Isolation Precautions No active isolations  Medications Medications  0.9 %  sodium chloride infusion (Manually program via Guardrails  IV Fluids) (has no administration in time range)  fentaNYL (SUBLIMAZE) injection 50 mcg (has no administration in time range)  pantoprazole (PROTONIX) injection 40 mg (40 mg Intravenous Given 10/20/22 2157)  folic acid (FOLVITE) tablet 1 mg (1 mg Oral Given 10/20/22 2156)  hydroxyurea (HYDREA) capsule 500 mg (has no administration in time range)  cefTRIAXone (ROCEPHIN) 2 g in sodium chloride 0.9 % 100 mL IVPB (0 g Intravenous Stopped 10/20/22 2228)  HYDROmorphone (DILAUDID) injection 0.5 mg (has no administration in time range)  albumin human 25 % solution 25 g (has no administration in time range)  lactulose (CHRONULAC) 10 GM/15ML solution 10 g (has no administration in time range)  prochlorperazine (COMPAZINE) injection 5 mg (has no administration in time range)  polyethylene glycol (MIRALAX / GLYCOLAX) packet 17 g (has no administration in time range)  melatonin tablet 3 mg (has no administration in time range)    Mobility walks     Focused Assessments Renal Assessment Handoff:  Hemodialysis Schedule: Hemodialysis Schedule: Tuesday/Thursday/Saturday Last Hemodialysis date and time: 10/20/22   Restricted appendage: right arm   R Recommendations: See Admitting Provider Note  Report given to:   Additional Notes: pt is on his first unit of blood,  blood pressure has been low and md is aware.

## 2022-10-20 NOTE — ED Provider Notes (Signed)
[place] EMERGENCY DEPARTMENT AT [facility] Provider Note   CSN: [id number] Arrival date & time: 10/20/22  1530     History  Chief Complaint  Patient presents with   Shortness of Breath    Patrick [name] is a 67 y.o. male.  67 year old male with history of ESRD who had dialysis today presents with 5-day history of right upper quadrant abdominal pain.  States that he has been short of breath and walk around.  Seen this week for anemia and had a blood transfusion.  Had a hemoglobin of 6 and normally runs around 8.  Denies any active blood loss.  States he is transfused frequently.  Patient normally runs a blood pressure around 80 according to him.  He has not had any fever, cough, congestion.  Does have a history of cholecystectomy.  States his pain is in the right upper quadrant and is worse with taking a deep breath.  No hemoptysis.  Presents for further evaluation       Home Medications Prior to Admission medications   Medication Sig Start Date End Date Taking? Authorizing Provider  acetaminophen (TYLENOL) 500 MG tablet Take 1,500 mg by mouth 3 (three) times daily as needed for mild pain, moderate pain, fever or headache.    [provider]  allopurinol (ZYLOPRIM) 100 MG tablet TAKE 1 TABLET BY MOUTH EVERY DAY 07/02/22   Paseda, Baird Kay, FNP  AURYXIA 1 GM 210 MG(Fe) tablet Take 210-420 mg by mouth See admin instructions. 420 mg with every meal, 210 mg with each snack. 02/11/21   [provider]  cinacalcet (SENSIPAR) 30 MG tablet Take 1 tablet (30 mg total) by mouth Every Tuesday,Thursday,and Saturday with dialysis. 06/12/22   Shahmehdi, Gemma Payor, MD  diphenhydramine-acetaminophen (TYLENOL PM) 25-500 MG TABS tablet Take 2 tablets by mouth at bedtime as needed (sleep/pain). 09/26/22   Ivonne Andrew, NP  folic acid (FOLVITE) 1 MG tablet Take 1 tablet (1 mg total) by mouth daily. 06/04/22   Massie Maroon, FNP  hydroxyurea (HYDREA) 500 MG capsule  Take 1 capsule (500 mg total) by mouth daily. 06/01/22   Quentin Angst, MD  Iron-Vitamins (GERITOL PO) Take 15 mLs by mouth daily.    [provider]  lidocaine-prilocaine (EMLA) cream Apply 1 application. topically Every Tuesday,Thursday,and Saturday with dialysis. 09/08/19   [provider]  Oxycodone HCl 10 MG TABS Take 1 tablet (10 mg total) by mouth every 6 (six) hours as needed. 10/12/22   Massie Maroon, FNP  pantoprazole (PROTONIX) 40 MG tablet Take 1 tablet (40 mg total) by mouth daily. 05/17/22   Mansouraty, Netty Starring., MD  tiZANidine (ZANAFLEX) 4 MG tablet TAKE 1/2 TABLET (2MG  TOTAL) BY MOUTH EVERY 8 HOURS AS NEEDED FOR MUSCLE SPASM 10/12/22   Massie Maroon, FNP  Torsemide 40 MG TABS Take 40 mg by mouth in the morning and at bedtime. 09/26/22 10/26/22  Ivonne Andrew, NP  Vitamin D, Ergocalciferol, (DRISDOL) 1.25 MG (50000 UNIT) CAPS capsule Take 1 capsule (50,000 Units total) by mouth once a week. Monday Patient not taking: Reported on 08/01/2022 04/08/22   Glade Lloyd, MD      Allergies    Patient has no known allergies.    Review of Systems   Review of Systems  All other systems reviewed and are negative.   Physical Exam Updated Vital Signs BP (!) 72/44   Pulse (!) 55   Temp 98.6 F (37 C)   Resp  16   Ht 1.803 m (5\' 11" )   Wt 62.1 kg   SpO2 97%   BMI 19.09 kg/m  Physical Exam Vitals and nursing note reviewed.  Constitutional:      General: He is not in acute distress.    Appearance: Normal appearance. He is well-developed. He is not toxic-appearing.  HENT:     Head: Normocephalic and atraumatic.  Eyes:     General: Lids are normal.     Conjunctiva/sclera: Conjunctivae normal.     Pupils: Pupils are equal, round, and reactive to light.  Neck:     Thyroid: No thyroid mass.     Trachea: No tracheal deviation.  Cardiovascular:     Rate and Rhythm: Normal rate and regular rhythm.     Heart sounds: Normal heart sounds. No murmur  heard.    No gallop.  Pulmonary:     Effort: Pulmonary effort is normal. No respiratory distress.     Breath sounds: Normal breath sounds. No stridor. No decreased breath sounds, wheezing, rhonchi or rales.  Abdominal:     General: Abdomen is protuberant. There is no distension.     Palpations: Abdomen is soft.     Tenderness: There is abdominal tenderness in the right upper quadrant. There is guarding. There is no rebound.    Musculoskeletal:        General: No tenderness. Normal range of motion.     Cervical back: Normal range of motion and neck supple.  Skin:    General: Skin is warm and dry.     Findings: No abrasion or rash.  Neurological:     Mental Status: He is alert and oriented to person, place, and time. Mental status is at baseline.     GCS: GCS eye subscore is 4. GCS verbal subscore is 5. GCS motor subscore is 6.     Cranial Nerves: No cranial nerve deficit.     Sensory: No sensory deficit.     Motor: Motor function is intact.  Psychiatric:        Attention and Perception: Attention normal.        Speech: Speech normal.        Behavior: Behavior normal.     ED Results / Procedures / Treatments   Labs (all labs ordered are listed, but only abnormal results are displayed) Labs Reviewed  CBC  LIPASE, BLOOD  COMPREHENSIVE METABOLIC PANEL  TYPE AND SCREEN  TROPONIN I (HIGH SENSITIVITY)    EKG EKG Interpretation  Date/Time:  Saturday Oct 20 2022 16:11:28 EDT Ventricular Rate:  58 PR Interval:    QRS Duration: 94 QT Interval:  476 QTC Calculation: 467 R Axis:   99 Text Interpretation: Atrial fibrillation with slow ventricular response Rightward axis Low voltage QRS Cannot rule out Anterior infarct , age undetermined Abnormal ECG When compared with ECG of 24-Jul-2022 03:07, PREVIOUS ECG IS PRESENT No significant change since last tracing Confirmed by Lorre Nick ([id number]) on 10/20/2022 4:30:44 PM  Radiology No results found.  Procedures Procedures     Medications Ordered in ED Medications - No data to display  ED Course/ Medical Decision Making/ A&P                             Medical Decision Making Amount and/or Complexity of Data Reviewed Labs: ordered. Radiology: ordered.  Risk Prescription drug management.   Patient with recurrent low hemoglobin here and will require blood transfusion.  Appears  to be likely due to his chronic kidney disease but patient also has sickle cell anemia..  Patient complained of abdominal discomfort and abdominal CT per my interpretation showed increase in ascites but otherwise no acute findings.  Patient has no evidence of S BP.  Chest x-ray per interpretation showed no acute findings.Marland Kitchen  His baseline hemoglobin is above 7.  Will admit to the hospitalist service        Final Clinical Impression(s) / ED Diagnoses Final diagnoses:  None    Rx / DC Orders ED Discharge Orders     None         Lorre Nick, MD 10/20/22 2012

## 2022-10-20 NOTE — ED Notes (Signed)
The pt is dialysis pt just dialyzed this am  he reports that his bp usually runs low especially when he is dialyzed

## 2022-10-21 ENCOUNTER — Other Ambulatory Visit (HOSPITAL_COMMUNITY): Payer: PPO

## 2022-10-21 ENCOUNTER — Inpatient Hospital Stay (HOSPITAL_COMMUNITY): Payer: PPO

## 2022-10-21 DIAGNOSIS — K449 Diaphragmatic hernia without obstruction or gangrene: Secondary | ICD-10-CM | POA: Diagnosis present

## 2022-10-21 DIAGNOSIS — I5022 Chronic systolic (congestive) heart failure: Secondary | ICD-10-CM

## 2022-10-21 DIAGNOSIS — Z1152 Encounter for screening for COVID-19: Secondary | ICD-10-CM | POA: Diagnosis not present

## 2022-10-21 DIAGNOSIS — K8689 Other specified diseases of pancreas: Secondary | ICD-10-CM | POA: Diagnosis present

## 2022-10-21 DIAGNOSIS — I4819 Other persistent atrial fibrillation: Secondary | ICD-10-CM

## 2022-10-21 DIAGNOSIS — H548 Legal blindness, as defined in USA: Secondary | ICD-10-CM | POA: Diagnosis present

## 2022-10-21 DIAGNOSIS — Z87891 Personal history of nicotine dependence: Secondary | ICD-10-CM | POA: Diagnosis not present

## 2022-10-21 DIAGNOSIS — Z9189 Other specified personal risk factors, not elsewhere classified: Secondary | ICD-10-CM

## 2022-10-21 DIAGNOSIS — Z8711 Personal history of peptic ulcer disease: Secondary | ICD-10-CM | POA: Diagnosis not present

## 2022-10-21 DIAGNOSIS — K2901 Acute gastritis with bleeding: Secondary | ICD-10-CM | POA: Diagnosis present

## 2022-10-21 DIAGNOSIS — D696 Thrombocytopenia, unspecified: Secondary | ICD-10-CM | POA: Insufficient documentation

## 2022-10-21 DIAGNOSIS — J449 Chronic obstructive pulmonary disease, unspecified: Secondary | ICD-10-CM | POA: Diagnosis not present

## 2022-10-21 DIAGNOSIS — D6959 Other secondary thrombocytopenia: Secondary | ICD-10-CM | POA: Diagnosis present

## 2022-10-21 DIAGNOSIS — D62 Acute posthemorrhagic anemia: Secondary | ICD-10-CM | POA: Diagnosis present

## 2022-10-21 DIAGNOSIS — I4821 Permanent atrial fibrillation: Secondary | ICD-10-CM | POA: Diagnosis present

## 2022-10-21 DIAGNOSIS — I509 Heart failure, unspecified: Secondary | ICD-10-CM

## 2022-10-21 DIAGNOSIS — I4891 Unspecified atrial fibrillation: Secondary | ICD-10-CM

## 2022-10-21 DIAGNOSIS — M109 Gout, unspecified: Secondary | ICD-10-CM | POA: Diagnosis present

## 2022-10-21 DIAGNOSIS — I9589 Other hypotension: Secondary | ICD-10-CM | POA: Insufficient documentation

## 2022-10-21 DIAGNOSIS — N186 End stage renal disease: Secondary | ICD-10-CM

## 2022-10-21 DIAGNOSIS — K259 Gastric ulcer, unspecified as acute or chronic, without hemorrhage or perforation: Secondary | ICD-10-CM | POA: Diagnosis not present

## 2022-10-21 DIAGNOSIS — D649 Anemia, unspecified: Secondary | ICD-10-CM | POA: Diagnosis present

## 2022-10-21 DIAGNOSIS — K7460 Unspecified cirrhosis of liver: Secondary | ICD-10-CM | POA: Insufficient documentation

## 2022-10-21 DIAGNOSIS — Z992 Dependence on renal dialysis: Secondary | ICD-10-CM

## 2022-10-21 DIAGNOSIS — K31819 Angiodysplasia of stomach and duodenum without bleeding: Secondary | ICD-10-CM | POA: Diagnosis not present

## 2022-10-21 DIAGNOSIS — E440 Moderate protein-calorie malnutrition: Secondary | ICD-10-CM | POA: Diagnosis present

## 2022-10-21 DIAGNOSIS — K2950 Unspecified chronic gastritis without bleeding: Secondary | ICD-10-CM | POA: Diagnosis not present

## 2022-10-21 DIAGNOSIS — Z681 Body mass index (BMI) 19 or less, adult: Secondary | ICD-10-CM | POA: Diagnosis not present

## 2022-10-21 DIAGNOSIS — R188 Other ascites: Secondary | ICD-10-CM

## 2022-10-21 DIAGNOSIS — I959 Hypotension, unspecified: Secondary | ICD-10-CM | POA: Insufficient documentation

## 2022-10-21 DIAGNOSIS — K219 Gastro-esophageal reflux disease without esophagitis: Secondary | ICD-10-CM | POA: Diagnosis present

## 2022-10-21 DIAGNOSIS — D571 Sickle-cell disease without crisis: Secondary | ICD-10-CM

## 2022-10-21 DIAGNOSIS — D5700 Hb-SS disease with crisis, unspecified: Secondary | ICD-10-CM | POA: Diagnosis present

## 2022-10-21 DIAGNOSIS — Z9049 Acquired absence of other specified parts of digestive tract: Secondary | ICD-10-CM | POA: Diagnosis not present

## 2022-10-21 DIAGNOSIS — G894 Chronic pain syndrome: Secondary | ICD-10-CM | POA: Diagnosis present

## 2022-10-21 LAB — CBC
HCT: 20.8 % — ABNORMAL LOW (ref 39.0–52.0)
Hemoglobin: 8 g/dL — ABNORMAL LOW (ref 13.0–17.0)
MCH: 37.9 pg — ABNORMAL HIGH (ref 26.0–34.0)
MCHC: 38.5 g/dL — ABNORMAL HIGH (ref 30.0–36.0)
MCV: 98.6 fL (ref 80.0–100.0)
Platelets: 85 10*3/uL — ABNORMAL LOW (ref 150–400)
RBC: 2.11 MIL/uL — ABNORMAL LOW (ref 4.22–5.81)
RDW: 23 % — ABNORMAL HIGH (ref 11.5–15.5)
WBC: 4.3 10*3/uL (ref 4.0–10.5)
nRBC: 12.1 % — ABNORMAL HIGH (ref 0.0–0.2)

## 2022-10-21 LAB — COMPREHENSIVE METABOLIC PANEL
ALT: 49 U/L — ABNORMAL HIGH (ref 0–44)
AST: 50 U/L — ABNORMAL HIGH (ref 15–41)
Albumin: 3.2 g/dL — ABNORMAL LOW (ref 3.5–5.0)
Alkaline Phosphatase: 174 U/L — ABNORMAL HIGH (ref 38–126)
Anion gap: 20 — ABNORMAL HIGH (ref 5–15)
BUN: 45 mg/dL — ABNORMAL HIGH (ref 8–23)
CO2: 25 mmol/L (ref 22–32)
Calcium: 7.7 mg/dL — ABNORMAL LOW (ref 8.9–10.3)
Chloride: 90 mmol/L — ABNORMAL LOW (ref 98–111)
Creatinine, Ser: 6.87 mg/dL — ABNORMAL HIGH (ref 0.61–1.24)
GFR, Estimated: 8 mL/min — ABNORMAL LOW (ref >60)
Glucose, Bld: 95 mg/dL (ref 70–99)
Potassium: 4.2 mmol/L (ref 3.5–5.1)
Sodium: 135 mmol/L (ref 135–145)
Total Bilirubin: 1.6 mg/dL — ABNORMAL HIGH (ref 0.3–1.2)
Total Protein: 6.9 g/dL (ref 6.5–8.1)

## 2022-10-21 LAB — BPAM RBC
Blood Product Expiration Date: 202405292359
Blood Product Expiration Date: 202405302359

## 2022-10-21 LAB — PROTIME-INR
INR: 1.5 — ABNORMAL HIGH (ref 0.8–1.2)
Prothrombin Time: 17.6 seconds — ABNORMAL HIGH (ref 11.4–15.2)

## 2022-10-21 LAB — TYPE AND SCREEN: Unit division: 0

## 2022-10-21 LAB — MAGNESIUM: Magnesium: 2.1 mg/dL (ref 1.7–2.4)

## 2022-10-21 LAB — PHOSPHORUS: Phosphorus: 4.7 mg/dL — ABNORMAL HIGH (ref 2.5–4.6)

## 2022-10-21 LAB — HEMOGLOBIN AND HEMATOCRIT, BLOOD
HCT: 19.9 % — ABNORMAL LOW (ref 39.0–52.0)
Hemoglobin: 7.3 g/dL — ABNORMAL LOW (ref 13.0–17.0)

## 2022-10-21 MED ORDER — CINACALCET HCL 30 MG PO TABS
30.0000 mg | ORAL_TABLET | ORAL | Status: DC
  Filled 2022-10-21: qty 1

## 2022-10-21 MED ORDER — ALLOPURINOL 100 MG PO TABS
100.0000 mg | ORAL_TABLET | Freq: Every day | ORAL | Status: DC
  Administered 2022-10-21 – 2022-10-23 (×3): 100 mg via ORAL
  Filled 2022-10-21 (×3): qty 1

## 2022-10-21 MED ORDER — ALBUMIN HUMAN 25 % IV SOLN
25.0000 g | Freq: Four times a day (QID) | INTRAVENOUS | Status: AC
  Administered 2022-10-21: 25 g via INTRAVENOUS
  Filled 2022-10-21: qty 100

## 2022-10-21 MED ORDER — HYDROMORPHONE HCL 1 MG/ML IJ SOLN
0.5000 mg | INTRAMUSCULAR | Status: DC | PRN
  Administered 2022-10-21 – 2022-10-22 (×5): 0.5 mg via INTRAVENOUS
  Filled 2022-10-21 (×5): qty 1

## 2022-10-21 MED ORDER — ORAL CARE MOUTH RINSE: 15.0000 mL | OROMUCOSAL | Status: DC | PRN

## 2022-10-21 MED ORDER — OXYCODONE HCL 5 MG PO TABS
10.0000 mg | ORAL_TABLET | Freq: Four times a day (QID) | ORAL | Status: DC | PRN
  Administered 2022-10-22 (×2): 10 mg via ORAL
  Filled 2022-10-21 (×2): qty 2

## 2022-10-21 NOTE — Assessment & Plan Note (Signed)
Not on anticoagulation at baseline.  Rate controlled here off meds.

## 2022-10-21 NOTE — Assessment & Plan Note (Addendum)
Appears euvolemic.  Low blood pressure precludes GDMT. - Hold torsemide due to soft blood pressure

## 2022-10-21 NOTE — Assessment & Plan Note (Signed)
No active disease 

## 2022-10-21 NOTE — Assessment & Plan Note (Signed)
Due to cirrhosis, stable from baseline

## 2022-10-21 NOTE — Assessment & Plan Note (Addendum)
Appears to have ascites, but went down for paracentesis and ultrasound showed no fluid amenable to tap 5/5.  Agree with GI, no need for octreotide, low suspicion for active variceal bleed. -Hold torsemide -Continue lactulose  -Continue Rocephin for now

## 2022-10-21 NOTE — Assessment & Plan Note (Signed)
-   Continue home oxycodone 

## 2022-10-21 NOTE — Assessment & Plan Note (Signed)
Hgb 5.8 on admission, transfused and up to 8 No clinical bleeding overnight - Trend Hgb

## 2022-10-21 NOTE — Progress Notes (Signed)
  Progress Note   Patient: Patrick [name] ZOX:[id number] DOB: 1955/08/22 DOA: 10/20/2022     0 DOS: the patient was seen and examined on 10/21/2022 at 11:07AM      Brief hospital course: Patrick [name] is a 67 y.o. M with ESRD on HD TThS, sCHF EF 35-40%, cirrhosis with ascites, permAF not on AC due to hx GIB from AVMs of stomach and duodenum, sickle cell disease, HTN, COPD, and hx emphysematous cholecystitis and SB ischemia in 2022 s/p cholecystectomy, SB resection and biliary stent who presented with 2 days SOB.     5/4: Found to have Hgb 5.8 in the ER, admitted and transfused, PPI and Abx started, GI consulted     Assessment and Plan: * Symptomatic anemia Hgb 5.8 on admission, transfused and up to 8 No clinical bleeding overnight - Trend Hgb  Cirrhosis (HCC) Appears to have ascites, but went down for paracentesis and ultrasound showed no fluid amenable to tap 5/5.  Agree with GI, no need for octreotide, low suspicion for active variceal bleed. -Hold torsemide -Continue lactulose  -Continue Rocephin for now  Persistent atrial fibrillation (HCC) Not on anticoagulation at baseline.  Rate controlled here off meds.  ESRD on HD TTS If still here on Tuesday we will consult nephrology for dialysis. - Hold torsemide due to soft blood pressure  Chronic systolic CHF (congestive heart failure) (HCC) Appears euvolemic.  Low blood pressure precludes GDMT. - Hold torsemide due to soft blood pressure  Thrombocytopenia (HCC) Due to cirrhosis, stable from baseline  Chronic hypotension    Chronic pain syndrome - Continue home oxycodone  COPD (chronic obstructive pulmonary disease) (HCC) No active disease  Protein-calorie malnutrition, moderate (HCC)    Sickle cell disease (HCC) - Continue hydroxyurea          Subjective: No bowel movements overnight, has some abdominal discomfort, pain in the right side of his abdomen with deep inspiration, but appetite good, no  vomiting, no confusion, no fever     Physical Exam: BP 104/71 (BP Location: Left Arm)   Pulse 88   Temp 98.2 F (36.8 C) (Oral)   Resp 16   Ht 5\' 11"  (1.803 m)   Wt 62.6 kg   SpO2 98%   BMI 19.25 kg/m   Adult male, lying in bed, eating ice pop, no acute distress RRR, no murmurs, no peripheral edema Respiratory rate normal, lungs clear without rales or wheezes Abdomen distended, seems like it has ascites, diffuse tenderness but no guarding Attention normal, affect appropriate, judgment insight appear normal, face symmetric, speech fluent     Data Reviewed: Chest with gastroenterology, Dr. Barron Alvine Troponin insignificant LFTs slightly elevated consistent with cirrhosis Hemoglobin 5.8 up to 8  Family Communication: None present    Disposition: Status is: Inpatient         Author: Alberteen Sam, MD 10/21/2022 2:39 PM  For on call review [email or web address].

## 2022-10-21 NOTE — Assessment & Plan Note (Signed)
Continue hydroxyurea

## 2022-10-21 NOTE — Assessment & Plan Note (Addendum)
If still here on Tuesday we will consult nephrology for dialysis. - Hold torsemide due to soft blood pressure

## 2022-10-21 NOTE — Consult Note (Addendum)
Attending physician's note   I have taken a history, reviewed the chart, and examined the patient. I performed a substantive portion of this encounter, including complete performance of at least one of the key components, in conjunction with the APP. I agree with the APP's note, impression, and recommendations with my edits.   67 year old male with medical history as outlined below presents with RUQ pain and acute on chronic anemia.  Known history of gastric/duodenal AVMs  s/p APC on most recent EGD in 07/2021.  Not on any anticoagulation due to history of GIB.  Hemoglobin was 6 at HD on 5/2, transfused 1 unit RBCs.  Repeat hemoglobin 5.8 yesterday, and transfused 2 more units RBCs with repeat H/H 8/20.8 today.  Unsure if he has had any overt bleeding as he is blind.    CT notable for cirrhotic appearing liver, ascites, pancreatic atrophy, but no acute intra-abdominal pathology.  Ultrasound by IR this afternoon with trace amount of intra-abdominal ascites, too small to allow for safe paracentesis.  - Serial CBC checks with additional blood products as needed per protocol - Plan for EGD tomorrow since HD scheduled for TTS - NPO at [facility] - Continue high-dose PPI - Continue Rocephin  The indications, risks, and benefits of EGD were explained to the patient in detail. Risks include but are not limited to bleeding, perforation, adverse reaction to medications, and cardiopulmonary compromise. Sequelae include but are not limited to the possibility of surgery, hospitalization, and mortality. The patient verbalized understanding and wished to proceed.    [address] DO, FACG [phone] office         Consultation  Referring Provider:TRH/ Danford MD Primary Care Physician:  Quentin Angst, MD Primary Gastroenterologist:  Dr.Stark  Reason for Consultation: Right upper quadrant pain and shortness of breath  HPI: Patrick [name] is a 67 y.o. male with complicated history and  multiple comorbidities who was admitted yesterday after dialysis with complaints of right upper quadrant pain and shortness of breath, worse with deep inspiration. He had been found to have significant anemia earlier in the week and was transfused 1 unit of packed RBCs during dialysis on Thursday for hemoglobin of 6. He has history of sickle cell disease, end-stage renal disease on dialysis, history of previous gastric ulcer, gastric AVMs which have been treated with APC, decompensated cirrhosis, atrial fibrillation-not on anticoagulation, and congestive heart failure with EF of 35 to 40%. He did have his gallbladder removed last fall, for acute cholecystitis with empyema, course was complicated by possible bile leak for which he underwent ERCP and stent placement per Dr. Russella Dar in October 2023.  Bile leak was not identified but due to concerns stent was placed.  He then had stent removal in November 2023.  Workup in the emergency room yesterday found hemoglobin 5.8/hematocrit 15.5, stool documented Hemoccult positive MCV 107.6/platelets 84 Lactate 1.5 Potassium 4.0/BUN 40/creatinine 5.93 T. bili 1.6/alk phos 87/AST 65/ALT 58  Was transfused 2 units and hemoglobin 8.2 today/hematocrit 28.8  CT of the abdomen pelvis yesterday showed trace right effusion, cirrhotic appearing liver, status postcholecystectomy, there is a small amount of pneumobilia, no dilated ducts, increased ascites over prior exam and significant increase in fluid in the lesser sac.   Patient is not a great historian but says that he has noticed increase in abdominal swelling over the past weeks the point of being fairly tight with melena at that time. Last colonoscopy April 2019 for iron deficiency anemia   He  denies any abdominal pain other than the tight pulling feeling in the right upper quadrant.  He has not noticed any change in his bowel habits and says he cannot tell if his stool is black as he is blind.  He has had prior  issues with ascites, and prior paracenteses though none in some time.  At the time of the ERCP in October 2023 he was noted to have a clean-based gastric ulcer and multiple erosions. EGD in February 2023 with 3 cm hiatal hernia and a few AVMs were noted in the stomach which were treated with APC. Has had multiple prior EGDs Last colonoscopy April 2019 done for iron deficiency anemia with finding of small internal hemorrhoids otherwise negative  Past Medical History:  Diagnosis Date   A-fib (HCC)    Blood dyscrasia    Blood transfusion    "I've had a bunch of them"   CHF (congestive heart failure) (HCC)    followed by hf clinic   Chronic kidney disease    Stage 5, dialysis on Tu/Th/Sa   Dialysis patient (HCC) 04/2019   AV fistula (right arm), TTS   Dysrhythmia    A-Fib per patient   Early satiety    Elevated LFTs 04/01/2022   Elevated troponin 04/01/2022   Empyema of gallbladder 05/27/2021   GERD (gastroesophageal reflux disease)    Gout    Legally blind    Metabolic acidosis with increased anion gap and accumulation of organic acids 04/01/2022   Mitral regurgitation    Muscle tightness-Right arm  05/27/2014   Pneumonia    Pulmonary hypertension (HCC)    Shortness of breath dyspnea    Sickle cell disease, type SS (HCC)    Swelling abdomen    Swelling of extremity, left    Swelling of extremity, right    Tricuspid regurgitation     Past Surgical History:  Procedure Laterality Date   A/V FISTULAGRAM Right 07/29/2020   Procedure: A/V FISTULAGRAM - Right Arm;  Surgeon: Chuck Hint, MD;  Location: [facility] INVASIVE CV LAB;  Service: Cardiovascular;  Laterality: Right;   BASCILIC VEIN TRANSPOSITION Right 04/12/2014   Procedure: BASILIC VEIN TRANSPOSITION;  Surgeon: Sherren Kerns, MD;  Location: [facility] OR;  Service: Vascular;  Laterality: Right;   BILIARY STENT PLACEMENT  04/04/2022   Procedure: BILIARY STENT PLACEMENT;  Surgeon: Meryl Dare, MD;  Location: [facility]  ENDOSCOPY;  Service: Gastroenterology;;   BIOPSY  08/04/2020   Procedure: BIOPSY;  Surgeon: Meryl Dare, MD;  Location: [facility] ENDOSCOPY;  Service: Gastroenterology;;   BIOPSY  06/28/2021   Procedure: BIOPSY;  Surgeon: Beverley Fiedler, MD;  Location: [facility] ENDOSCOPY;  Service: Gastroenterology;;   BIOPSY  04/02/2022   Procedure: BIOPSY;  Surgeon: Lemar Lofty., MD;  Location: [facility] ENDOSCOPY;  Service: Gastroenterology;;   BOWEL RESECTION N/A 05/24/2021   Procedure: SMALL BOWEL RESECTION;  Surgeon: Emelia Loron, MD;  Location: [facility] OR;  Service: General;  Laterality: N/A;   CARDIOVERSION N/A 06/05/2018   Procedure: CARDIOVERSION;  Surgeon: Dolores Patty, MD;  Location: [facility] ENDOSCOPY;  Service: Cardiovascular;  Laterality: N/A;   CARDIOVERSION N/A 10/06/2019   Procedure: CARDIOVERSION;  Surgeon: Dolores Patty, MD;  Location: [facility] ENDOSCOPY;  Service: Cardiovascular;  Laterality: N/A;   CHOLECYSTECTOMY N/A 05/24/2021   Procedure: OPEN CHOLECYSTECTOMY;  Surgeon: Emelia Loron, MD;  Location: [facility] OR;  Service: General;  Laterality: N/A;   COLONOSCOPY WITH PROPOFOL N/A 09/24/2017   Procedure: COLONOSCOPY WITH PROPOFOL;  Surgeon: Lynann Bologna, MD;  Location: [facility] ENDOSCOPY;  Service: Endoscopy;  Laterality: N/A;   ENDOSCOPIC RETROGRADE CHOLANGIOPANCREATOGRAPHY (ERCP) WITH PROPOFOL N/A 05/17/2022   Procedure: ENDOSCOPIC RETROGRADE CHOLANGIOPANCREATOGRAPHY (ERCP) WITH PROPOFOL;  Surgeon: Meridee Score Netty Starring., MD;  Location: [facility] ENDOSCOPY;  Service: Gastroenterology;  Laterality: N/A;   ERCP N/A 04/04/2022   Procedure: ENDOSCOPIC RETROGRADE CHOLANGIOPANCREATOGRAPHY (ERCP);  Surgeon: Meryl Dare, MD;  Location: [facility] ENDOSCOPY;  Service: Gastroenterology;  Laterality: N/A;   ESOPHAGOGASTRODUODENOSCOPY N/A 09/19/2017   Procedure: ESOPHAGOGASTRODUODENOSCOPY (EGD);  Surgeon: Lynann Bologna, MD;  Location: [facility] ENDOSCOPY;  Service: Endoscopy;  Laterality: N/A;    ESOPHAGOGASTRODUODENOSCOPY N/A 05/29/2021   Procedure: ESOPHAGOGASTRODUODENOSCOPY (EGD);  Surgeon: Rachael Fee, MD;  Location: [facility] ENDOSCOPY;  Service: Gastroenterology;  Laterality: N/A;   ESOPHAGOGASTRODUODENOSCOPY (EGD) WITH PROPOFOL N/A 08/04/2020   Procedure: ESOPHAGOGASTRODUODENOSCOPY (EGD) WITH PROPOFOL;  Surgeon: Meryl Dare, MD;  Location: [facility] ENDOSCOPY;  Service: Gastroenterology;  Laterality: N/A;   ESOPHAGOGASTRODUODENOSCOPY (EGD) WITH PROPOFOL N/A 06/28/2021   Procedure: ESOPHAGOGASTRODUODENOSCOPY (EGD) WITH PROPOFOL;  Surgeon: Beverley Fiedler, MD;  Location: [facility] ENDOSCOPY;  Service: Gastroenterology;  Laterality: N/A;   ESOPHAGOGASTRODUODENOSCOPY (EGD) WITH PROPOFOL N/A 08/10/2021   Procedure: ESOPHAGOGASTRODUODENOSCOPY (EGD) WITH PROPOFOL;  Surgeon: Lynann Bologna, MD;  Location: [facility] ENDOSCOPY;  Service: Gastroenterology;  Laterality: N/A;   ESOPHAGOGASTRODUODENOSCOPY (EGD) WITH PROPOFOL N/A 08/02/2021   Procedure: ESOPHAGOGASTRODUODENOSCOPY (EGD) WITH PROPOFOL;  Surgeon: Shellia Cleverly, DO;  Location: [facility] ENDOSCOPY;  Service: Gastroenterology;  Laterality: N/A;   ESOPHAGOGASTRODUODENOSCOPY (EGD) WITH PROPOFOL N/A 04/02/2022   Procedure: ESOPHAGOGASTRODUODENOSCOPY (EGD) WITH PROPOFOL;  Surgeon: Meridee Score Netty Starring., MD;  Location: [facility] ENDOSCOPY;  Service: Gastroenterology;  Laterality: N/A;   ESOPHAGOGASTRODUODENOSCOPY (EGD) WITH PROPOFOL N/A 05/17/2022   Procedure: ESOPHAGOGASTRODUODENOSCOPY (EGD) WITH PROPOFOL;  Surgeon: Meridee Score Netty Starring., MD;  Location: [facility] ENDOSCOPY;  Service: Gastroenterology;  Laterality: N/A;   EUS N/A 05/17/2022   Procedure: UPPER ENDOSCOPIC ULTRASOUND (EUS) RADIAL;  Surgeon: Lemar Lofty., MD;  Location: [facility] ENDOSCOPY;  Service: Gastroenterology;  Laterality: N/A;   FINE NEEDLE ASPIRATION  04/02/2022   Procedure: FINE NEEDLE ASPIRATION (FNA) LINEAR;  Surgeon: Lemar Lofty., MD;  Location: [facility] ENDOSCOPY;  Service:  Gastroenterology;;   FINE NEEDLE ASPIRATION N/A 05/17/2022   Procedure: FINE NEEDLE ASPIRATION (FNA) LINEAR;  Surgeon: Lemar Lofty., MD;  Location: [facility] ENDOSCOPY;  Service: Gastroenterology;  Laterality: N/A;   HEMOSTASIS CLIP PLACEMENT  05/29/2021   Procedure: HEMOSTASIS CLIP PLACEMENT;  Surgeon: Rachael Fee, MD;  Location: [facility] ENDOSCOPY;  Service: Gastroenterology;;   HOT HEMOSTASIS N/A 05/29/2021   Procedure: HOT HEMOSTASIS (ARGON PLASMA COAGULATION/BICAP);  Surgeon: Rachael Fee, MD;  Location: [facility] ENDOSCOPY;  Service: Gastroenterology;  Laterality: N/A;   HOT HEMOSTASIS N/A 06/28/2021   Procedure: HOT HEMOSTASIS (ARGON PLASMA COAGULATION/BICAP);  Surgeon: Beverley Fiedler, MD;  Location: [facility] ENDOSCOPY;  Service: Gastroenterology;  Laterality: N/A;   HOT HEMOSTASIS N/A 08/10/2021   Procedure: HOT HEMOSTASIS (ARGON PLASMA COAGULATION/BICAP);  Surgeon: Lynann Bologna, MD;  Location: [facility] ENDOSCOPY;  Service: Gastroenterology;  Laterality: N/A;   HOT HEMOSTASIS N/A 08/02/2021   Procedure: HOT HEMOSTASIS (ARGON PLASMA COAGULATION/BICAP);  Surgeon: Shellia Cleverly, DO;  Location: [facility] ENDOSCOPY;  Service: Gastroenterology;  Laterality: N/A;   IR PARACENTESIS  07/04/2022   IR PERC TUN PERIT CATH WO PORT S&I /IMAG  05/23/2021   IR REMOVAL TUN CV CATH W/O FL  06/10/2021   IR US GUIDE VASC ACCESS RIGHT  05/23/2021   LAPAROSCOPY N/A 05/24/2021   Procedure: LAPAROSCOPY DIAGNOSTIC;  Surgeon: Emelia Loron, MD;  Location:  [facility] OR;  Service: General;  Laterality: N/A;   LAPAROTOMY N/A 05/24/2021   Procedure: EXPLORATORY LAPAROTOMY;  Surgeon: Emelia Loron, MD;  Location: [facility] OR;  Service: General;  Laterality: N/A;   LEFT AND RIGHT HEART CATHETERIZATION WITH CORONARY ANGIOGRAM N/A 06/11/2011   Procedure: LEFT AND RIGHT HEART CATHETERIZATION WITH CORONARY ANGIOGRAM;  Surgeon: Laurey Morale, MD;  Location: [facility] CATH LAB;  Service: Cardiovascular;  Laterality: N/A;   PERIPHERAL VASCULAR  BALLOON ANGIOPLASTY Right 07/29/2020   Procedure: PERIPHERAL VASCULAR BALLOON ANGIOPLASTY;  Surgeon: Chuck Hint, MD;  Location: [facility] INVASIVE CV LAB;  Service: Cardiovascular;  Laterality: Right;  arm fistula   REMOVAL OF STONES  05/17/2022   Procedure: REMOVAL OF STONES;  Surgeon: Meridee Score Netty Starring., MD;  Location: [facility] ENDOSCOPY;  Service: Gastroenterology;;   REVISON OF ARTERIOVENOUS FISTULA Right 08/08/2020   Procedure: ANEURYSM EXCISION OF RIGHT UPPER EXTREMITY ARTERIOVENOUS FISTULA;  Surgeon: Chuck Hint, MD;  Location: [facility] OR;  Service: Vascular;  Laterality: Right;   REVISON OF ARTERIOVENOUS FISTULA Right 03/21/2022   Procedure: EXCISION OF ULCERATED SKIN OF RIGHT ARTERIOVENOUS FISTULA REVISION;  Surgeon: Cephus Shelling, MD;  Location: [facility] OR;  Service: Vascular;  Laterality: Right;   REVISON OF ARTERIOVENOUS FISTULA Right 07/02/2022   Procedure: EXCISION OF ULCERS RIGHT ARTERIOVENOUS FISTULA;  Surgeon: Chuck Hint, MD;  Location: [facility] OR;  Service: Vascular;  Laterality: Right;   SCLEROTHERAPY  05/29/2021   Procedure: Susa Day;  Surgeon: Rachael Fee, MD;  Location: [facility] ENDOSCOPY;  Service: Gastroenterology;;   Dennison Mascot  04/04/2022   Procedure: SPHINCTEROTOMY;  Surgeon: Meryl Dare, MD;  Location: [facility] ENDOSCOPY;  Service: Gastroenterology;;   Francine Graven REMOVAL  05/29/2021   Procedure: STENT REMOVAL;  Surgeon: Rachael Fee, MD;  Location: [facility] ENDOSCOPY;  Service: Gastroenterology;;   Francine Graven REMOVAL  05/17/2022   Procedure: STENT REMOVAL;  Surgeon: Lemar Lofty., MD;  Location: [facility] ENDOSCOPY;  Service: Gastroenterology;;   TEE WITHOUT CARDIOVERSION N/A 12/27/2015   Procedure: TRANSESOPHAGEAL ECHOCARDIOGRAM (TEE);  Surgeon: Quintella Reichert, MD;  Location: [facility] ENDOSCOPY;  Service: Cardiovascular;  Laterality: N/A;   UPPER ESOPHAGEAL ENDOSCOPIC ULTRASOUND (EUS) N/A 04/02/2022   Procedure: UPPER ESOPHAGEAL ENDOSCOPIC ULTRASOUND (EUS);   Surgeon: Lemar Lofty., MD;  Location: [facility] ENDOSCOPY;  Service: Gastroenterology;  Laterality: N/A;    Prior to Admission medications   Medication Sig Start Date End Date Taking? Authorizing Provider  acetaminophen (TYLENOL) 500 MG tablet Take 1,500 mg by mouth 3 (three) times daily as needed for mild pain, moderate pain, fever or headache.   Yes [provider]  allopurinol (ZYLOPRIM) 100 MG tablet TAKE 1 TABLET BY MOUTH EVERY DAY 07/02/22  Yes Paseda, Baird Kay, FNP  AURYXIA 1 GM 210 MG(Fe) tablet Take 210-420 mg by mouth See admin instructions. 420 mg with every meal, 210 mg with each snack. 02/11/21  Yes [provider]  cinacalcet (SENSIPAR) 30 MG tablet Take 1 tablet (30 mg total) by mouth Every Tuesday,Thursday,and Saturday with dialysis. 06/12/22  Yes Shahmehdi, Seyed A, MD  diphenhydramine-acetaminophen (TYLENOL PM) 25-500 MG TABS tablet Take 2 tablets by mouth at bedtime as needed (sleep/pain). 09/26/22  Yes Ivonne Andrew, NP  folic acid (FOLVITE) 1 MG tablet Take 1 tablet (1 mg total) by mouth daily. 06/04/22  Yes Massie Maroon, FNP  hydroxyurea (HYDREA) 500 MG capsule Take 1 capsule (500 mg total) by mouth daily. 06/01/22  Yes Jegede, [name], MD  Iron-Vitamins (GERITOL PO) Take 15 mLs by mouth  daily.   Yes [provider]  lidocaine-prilocaine (EMLA) cream Apply 1 application. topically Every Tuesday,Thursday,and Saturday with dialysis. 09/08/19  Yes [provider]  Oxycodone HCl 10 MG TABS Take 1 tablet (10 mg total) by mouth every 6 (six) hours as needed. 10/12/22  Yes Massie Maroon, FNP  pantoprazole (PROTONIX) 40 MG tablet Take 1 tablet (40 mg total) by mouth daily. 05/17/22  Yes Mansouraty, Netty Starring., MD  tiZANidine (ZANAFLEX) 4 MG tablet TAKE 1/2 TABLET (2MG  TOTAL) BY MOUTH EVERY 8 HOURS AS NEEDED FOR MUSCLE SPASM Patient taking differently: Take 2 mg by mouth every 6 (six) hours as needed for muscle spasms. 10/12/22  Yes  Massie Maroon, FNP  Torsemide 40 MG TABS Take 40 mg by mouth in the morning and at bedtime. 09/26/22 10/26/22 Yes Ivonne Andrew, NP  Vitamin D, Ergocalciferol, (DRISDOL) 1.25 MG (50000 UNIT) CAPS capsule Take 1 capsule (50,000 Units total) by mouth once a week. Monday Patient not taking: Reported on 08/01/2022 04/08/22   Glade Lloyd, MD    Current Facility-Administered Medications  Medication Dose Route Frequency Provider Last Rate Last Admin   albumin human 25 % solution 25 g  25 g Intravenous Q6H Danford, Earl Lites, MD       cefTRIAXone (ROCEPHIN) 2 g in sodium chloride 0.9 % 100 mL IVPB  2 g Intravenous Q24H Dow Adolph N, DO   Stopped at 10/20/22 2228   folic acid (FOLVITE) tablet 1 mg  1 mg Oral Daily Dow Adolph N, DO   1 mg at 10/21/22 0900   HYDROmorphone (DILAUDID) injection 0.5 mg  0.5 mg Intravenous Q2H PRN Opyd, Lavone Neri, MD   0.5 mg at 10/21/22 1056   hydroxyurea (HYDREA) capsule 500 mg  500 mg Oral Daily Dow Adolph N, DO   500 mg at 10/21/22 0900   lactulose (CHRONULAC) 10 GM/15ML solution 10 g  10 g Oral BID Dow Adolph N, DO   10 g at 10/21/22 0900   melatonin tablet 3 mg  3 mg Oral QHS PRN Darlin Drop, DO       Oral care mouth rinse  15 mL Mouth Rinse PRN Dow Adolph N, DO       pantoprazole (PROTONIX) injection 40 mg  40 mg Intravenous BID Hall, Carole N, DO   40 mg at 10/21/22 0900   polyethylene glycol (MIRALAX / GLYCOLAX) packet 17 g  17 g Oral Daily PRN Dow Adolph N, DO       prochlorperazine (COMPAZINE) injection 5 mg  5 mg Intravenous Q6H PRN Dow Adolph N, DO        Allergies as of 10/20/2022   (No Known Allergies)    Family History  Problem Relation Age of Onset   Alcohol abuse Mother    Liver disease Mother    Cirrhosis Mother    Heart attack Mother     Social History   Socioeconomic History   Marital status: Divorced    Spouse name: Not on file   Number of children: Not on file   Years of education: Not on file   Highest  education level: Not on file  Occupational History   Occupation: disabled  Tobacco Use   Smoking status: Former    Packs/day: 0.50    Years: 41.00    Additional pack years: 0.00    Total pack years: 20.50    Types: Cigarettes    Quit date: 12/08/2015    Years since quitting: 6.8  Smokeless tobacco: Never  Vaping Use   Vaping Use: Never used  Substance and Sexual Activity   Alcohol use: Not Currently    Alcohol/week: 3.0 standard drinks of alcohol    Types: 1 Glasses of wine, 2 Cans of beer per week    Comment: none since starting dialysis 06/2021   Drug use: No   Sexual activity: Not Currently  Other Topics Concern   Not on file  Social History Narrative   Not on file   Social Determinants of Health   Financial Resource Strain: Low Risk  (09/27/2022)   Overall Financial Resource Strain (CARDIA)    Difficulty of Paying Living Expenses: Not hard at all  Food Insecurity: No Food Insecurity (10/20/2022)   Hunger Vital Sign    Worried About Running Out of Food in the Last Year: Never true    Ran Out of Food in the Last Year: Never true  Transportation Needs: No Transportation Needs (10/20/2022)   PRAPARE - Administrator, Civil Service (Medical): No    Lack of Transportation (Non-Medical): No  Physical Activity: Inactive (09/27/2022)   Exercise Vital Sign    Days of Exercise per Week: 0 days    Minutes of Exercise per Session: 0 min  Stress: No Stress Concern Present (09/27/2022)   [employer] of Occupational Health - Occupational Stress Questionnaire    Feeling of Stress : Not at all  Social Connections: Socially Isolated (09/27/2022)   Social Connection and Isolation Panel [NHANES]    Frequency of Communication with Friends and Family: More than three times a week    Frequency of Social Gatherings with Friends and Family: More than three times a week    Attends Religious Services: Never    Database administrator or Organizations: No    Attends Tax inspector Meetings: Never    Marital Status: Divorced  Catering manager Violence: Not At Risk (10/20/2022)   Humiliation, Afraid, Rape, and Kick questionnaire    Fear of Current or Ex-Partner: No    Emotionally Abused: No    Physically Abused: No    Sexually Abused: No    Review of Systems: Pertinent positive and negative review of systems were noted in the above HPI section.  All other review of systems was otherwise negative.   Physical Exam: Vital signs in last 24 hours: Temp:  [97.6 F (36.4 C)-98.6 F (37 C)] 98.2 F (36.8 C) (05/05 0920) Pulse Rate:  [50-163] 88 (05/05 0920) Resp:  [6-23] 16 (05/05 0920) BP: (72-108)/(43-83) 104/71 (05/05 0920) SpO2:  [93 %-100 %] 98 % (05/05 0920) Weight:  [62.1 kg-62.6 kg] 62.6 kg (05/05 0124) Last BM Date : 10/20/22 General:   Alert,  Well-developed, thin, chronically ill-appearing older African-American male pleasant and cooperative in NAD Head:  Normocephalic and atraumatic. Eyes:  Sclera clear, no icterus.   Conjunctiva pink. Ears:  Normal auditory acuity. Nose:  No deformity, discharge,  or lesions. Mouth:  No deformity or lesions.   Neck:  Supple; no masses or thyromegaly. Lungs:  Clear throughout to auscultation.   No wheezes, crackles, or rhonchi . Heart:  Regular rate and rhythm; no murmurs, clicks, rubs,  or gallops. Abdomen: Protuberant, tense ascites, there is some tenderness in the right upper quadrant into the right mid quadrant, no palpable mass or hepatosplenomegaly, midline incisional scars Rectal:not  Done-documented heme positive Msk:  Symmetrical without gross deformities. . Pulses:  Normal pulses noted. Extremities:  Without clubbing or edema. Neurologic:  Alert and  oriented x4;  grossly normal neurologically.  Patient is blind Skin:  Intact without significant lesions or rashes.. Psych:  Alert and cooperative. Normal mood and affect.  Intake/Output from previous day: 05/04 0701 - 05/05 0700 In: 976.4  [Blood:795.2; IV Piggyback:181.2] Out: -  Intake/Output this shift: No intake/output data recorded.  Lab Results: Recent Labs    10/20/22 1700 10/21/22 0730  WBC 5.1 4.3  HGB 5.8* 8.0*  HCT 15.5* 20.8*  PLT 84* 85*   BMET Recent Labs    10/20/22 1709 10/21/22 0730  NA 135 135  K 4.0 4.2  CL 94* 90*  CO2 26 25  GLUCOSE 98 95  BUN 40* 45*  CREATININE 5.93* 6.87*  CALCIUM 7.8* 7.7*   LFT Recent Labs    10/21/22 0730  PROT 6.9  ALBUMIN 3.2*  AST 50*  ALT 49*  ALKPHOS 174*  BILITOT 1.6*   PT/INR No results for input(s): "LABPROT", "INR" in the last 72 hours. Hepatitis Panel No results for input(s): "HEPBSAG", "HCVAB", "HEPAIGM", "HEPBIGM" in the last 72 hours.   IMPRESSION:  #40 67 year old African-American male, with end-stage renal disease on hemodialysis noted after dialysis yesterday with complaints of right upper quadrant pain and shortness of breath. This is in the setting of progressive anemia requiring 1 unit of packed RBCs during dialysis on 10/18/2022 and known history of decompensated cirrhosis  CT scan is pertinent for a cirrhotic appearing liver, status postcholecystectomy, small amount of pneumobilia (has had prior ERCP) no ductal dilation, increased ascites  His right upper quadrant pain may be secondary to significant ascites with tense abdomen, he is not having any diffuse abdominal pain, doubt SBP but cannot rule out  #2 acute on chronic anemia, heme positive stool.  Suspect she has been having some GI bleeding over the past week or so and has required 3 units of packed RBCs in the past few days. He has had prior history of gastric ulcer, and gastric AVMs which have required treatment with APC. Rule out bleeding secondary to AVMs, consider portal gastropathy, ulcer disease versus other  #3 status post cholecystectomy in fall 2023, acute cholecystitis with empyema, subsequent ERCP for bile leak-stent placement and then stent removal November  2023  #4 sickle cell disease #5  permanent atrial fibrillation-no anticoagulation #6 congestive heart failure with EF 35 to 40% #7.  History of hypertension #8.  COPD   PLAN: Okay for diet today We will schedule for paracentesis today, remove up to 5 L, will send for Gram stain, cell counts,culture etc. Twice daily PPI IV Rocephin started on admit Continue to trend hemoglobin to 8 hours, transfuse to keep hemoglobin 7.5 Will add INR, AFP and iron studies-will calculate MELD after above results  Patient has dialysis tomorrow, we can plan for EGD on Tuesday, 10/23/2022.   GI Will follow with you.   Amy Esterwood PA-C 10/21/2022, 11:00 AM

## 2022-10-21 NOTE — Progress Notes (Signed)
Request to IR for diagnostic and therapeutic paracentesis.  Limited abdominal US of all 4 quadrants shows scant LUQ fluid which is not amenable to paracentesis. Imaging results explained to patient who states understanding.  No procedure performed - please place a new order for paracentesis if patient would benefit from repeat evaluation next week.  Images available for review under imaging section of Epic.  Lynnette Caffey, PA-C

## 2022-10-21 NOTE — H&P (View-Only) (Signed)
   Attending physician's note   I have taken a history, reviewed the chart, and examined the patient. I performed a substantive portion of this encounter, including complete performance of at least one of the key components, in conjunction with the APP. I agree with the APP's note, impression, and recommendations with my edits.   67-year-old male with medical history as outlined below presents with RUQ pain and acute on chronic anemia.  Known history of gastric/duodenal AVMs  s/p APC on most recent EGD in 07/2021.  Not on any anticoagulation due to history of GIB.  Hemoglobin was 6 at HD on 5/2, transfused 1 unit RBCs.  Repeat hemoglobin 5.8 yesterday, and transfused 2 more units RBCs with repeat H/H 8/20.8 today.  Unsure if he has had any overt bleeding as he is blind.    CT notable for cirrhotic appearing liver, ascites, pancreatic atrophy, but no acute intra-abdominal pathology.  Ultrasound by IR this afternoon with trace amount of intra-abdominal ascites, too small to allow for safe paracentesis.  - Serial CBC checks with additional blood products as needed per protocol - Plan for EGD tomorrow since HD scheduled for TTS - NPO at [facility] - Continue high-dose PPI - Continue Rocephin  The indications, risks, and benefits of EGD were explained to the patient in detail. Risks include but are not limited to bleeding, perforation, adverse reaction to medications, and cardiopulmonary compromise. Sequelae include but are not limited to the possibility of surgery, hospitalization, and mortality. The patient verbalized understanding and wished to proceed.    Skyla Champagne, DO, FACG [phone] office         Consultation  Referring Provider:TRH/ Danford MD Primary Care Physician:  Jegede, Olugbemiga E, MD Primary Gastroenterologist:  Dr.Stark  Reason for Consultation: Right upper quadrant pain and shortness of breath  HPI: Patrick [name] is a 67 y.o. male with complicated history and  multiple comorbidities who was admitted yesterday after dialysis with complaints of right upper quadrant pain and shortness of breath, worse with deep inspiration. He had been found to have significant anemia earlier in the week and was transfused 1 unit of packed RBCs during dialysis on Thursday for hemoglobin of 6. He has history of sickle cell disease, end-stage renal disease on dialysis, history of previous gastric ulcer, gastric AVMs which have been treated with APC, decompensated cirrhosis, atrial fibrillation-not on anticoagulation, and congestive heart failure with EF of 35 to 40%. He did have his gallbladder removed last fall, for acute cholecystitis with empyema, course was complicated by possible bile leak for which he underwent ERCP and stent placement per Dr. Stark in October 2023.  Bile leak was not identified but due to concerns stent was placed.  He then had stent removal in November 2023.  Workup in the emergency room yesterday found hemoglobin 5.8/hematocrit 15.5, stool documented Hemoccult positive MCV 107.6/platelets 84 Lactate 1.5 Potassium 4.0/BUN 40/creatinine 5.93 T. bili 1.6/alk phos 87/AST 65/ALT 58  Was transfused 2 units and hemoglobin 8.2 today/hematocrit 28.8  CT of the abdomen pelvis yesterday showed trace right effusion, cirrhotic appearing liver, status postcholecystectomy, there is a small amount of pneumobilia, no dilated ducts, increased ascites over prior exam and significant increase in fluid in the lesser sac.   Patient is not a great historian but says that he has noticed increase in abdominal swelling over the past weeks the point of being fairly tight with melena at that time. Last colonoscopy April 2019 for iron deficiency anemia   He   denies any abdominal pain other than the tight pulling feeling in the right upper quadrant.  He has not noticed any change in his bowel habits and says he cannot tell if his stool is black as he is blind.  He has had prior  issues with ascites, and prior paracenteses though none in some time.  At the time of the ERCP in October 2023 he was noted to have a clean-based gastric ulcer and multiple erosions. EGD in February 2023 with 3 cm hiatal hernia and a few AVMs were noted in the stomach which were treated with APC. Has had multiple prior EGDs Last colonoscopy April 2019 done for iron deficiency anemia with finding of small internal hemorrhoids otherwise negative  Past Medical History:  Diagnosis Date   A-fib (HCC)    Blood dyscrasia    Blood transfusion    "I've had a bunch of them"   CHF (congestive heart failure) (HCC)    followed by hf clinic   Chronic kidney disease    Stage 5, dialysis on Tu/Th/Sa   Dialysis patient (HCC) 04/2019   AV fistula (right arm), TTS   Dysrhythmia    A-Fib per patient   Early satiety    Elevated LFTs 04/01/2022   Elevated troponin 04/01/2022   Empyema of gallbladder 05/27/2021   GERD (gastroesophageal reflux disease)    Gout    Legally blind    Metabolic acidosis with increased anion gap and accumulation of organic acids 04/01/2022   Mitral regurgitation    Muscle tightness-Right arm  05/27/2014   Pneumonia    Pulmonary hypertension (HCC)    Shortness of breath dyspnea    Sickle cell disease, type SS (HCC)    Swelling abdomen    Swelling of extremity, left    Swelling of extremity, right    Tricuspid regurgitation     Past Surgical History:  Procedure Laterality Date   A/V FISTULAGRAM Right 07/29/2020   Procedure: A/V FISTULAGRAM - Right Arm;  Surgeon: Dickson, Christopher S, MD;  [facility];  Service: Cardiovascular;  Laterality: Right;   BASCILIC VEIN TRANSPOSITION Right 04/12/2014   Procedure: BASILIC VEIN TRANSPOSITION;  Surgeon: Charles E Fields, MD;  [facility]: [facility];  Service: Vascular;  Laterality: Right;   BILIARY STENT PLACEMENT  04/04/2022   Procedure: BILIARY STENT PLACEMENT;  Surgeon: Stark, Malcolm T, MD;  [facility];  Service: Gastroenterology;;   BIOPSY  08/04/2020   Procedure: BIOPSY;  Surgeon: Stark, Malcolm T, MD;  [facility];  Service: Gastroenterology;;   BIOPSY  06/28/2021   Procedure: BIOPSY;  Surgeon: Pyrtle, Jay M, MD;  [facility];  Service: Gastroenterology;;   BIOPSY  04/02/2022   Procedure: BIOPSY;  Surgeon: Mansouraty, Gabriel Jr., MD;  [facility];  Service: Gastroenterology;;   BOWEL RESECTION N/A 05/24/2021   Procedure: SMALL BOWEL RESECTION;  Surgeon: Wakefield, Matthew, MD;  [facility];  Service: General;  Laterality: N/A;   CARDIOVERSION N/A 06/05/2018   Procedure: CARDIOVERSION;  Surgeon: Bensimhon, Daniel R, MD;  [facility];  Service: Cardiovascular;  Laterality: N/A;   CARDIOVERSION N/A 10/06/2019   Procedure: CARDIOVERSION;  Surgeon: Bensimhon, Daniel R, MD;  [facility];  Service: Cardiovascular;  Laterality: N/A;   CHOLECYSTECTOMY N/A 05/24/2021   Procedure: OPEN CHOLECYSTECTOMY;  Surgeon: Wakefield, Matthew, MD;  [facility];  Service: General;  Laterality: N/A;   COLONOSCOPY WITH PROPOFOL N/A 09/24/2017   Procedure: COLONOSCOPY WITH PROPOFOL;  Surgeon: Gupta, Rajesh, MD;    [facility];  Service: Endoscopy;  Laterality: N/A;   ENDOSCOPIC RETROGRADE CHOLANGIOPANCREATOGRAPHY (ERCP) WITH PROPOFOL N/A 05/17/2022   Procedure: ENDOSCOPIC RETROGRADE CHOLANGIOPANCREATOGRAPHY (ERCP) WITH PROPOFOL;  Surgeon: Mansouraty, Gabriel Jr., MD;  [facility]: [facility];  Service: Gastroenterology;  Laterality: N/A;   ERCP N/A 04/04/2022   Procedure: ENDOSCOPIC RETROGRADE CHOLANGIOPANCREATOGRAPHY (ERCP);  Surgeon: Stark, Malcolm T, MD;  [facility];  Service: Gastroenterology;  Laterality: N/A;   ESOPHAGOGASTRODUODENOSCOPY N/A 09/19/2017   Procedure: ESOPHAGOGASTRODUODENOSCOPY (EGD);  Surgeon: Gupta, Rajesh, MD;  [facility];  Service: Endoscopy;  Laterality: N/A;    ESOPHAGOGASTRODUODENOSCOPY N/A 05/29/2021   Procedure: ESOPHAGOGASTRODUODENOSCOPY (EGD);  Surgeon: Jacobs, Daniel P, MD;  [facility];  Service: Gastroenterology;  Laterality: N/A;   ESOPHAGOGASTRODUODENOSCOPY (EGD) WITH PROPOFOL N/A 08/04/2020   Procedure: ESOPHAGOGASTRODUODENOSCOPY (EGD) WITH PROPOFOL;  Surgeon: Stark, Malcolm T, MD;  [facility];  Service: Gastroenterology;  Laterality: N/A;   ESOPHAGOGASTRODUODENOSCOPY (EGD) WITH PROPOFOL N/A 06/28/2021   Procedure: ESOPHAGOGASTRODUODENOSCOPY (EGD) WITH PROPOFOL;  Surgeon: Pyrtle, Jay M, MD;  [facility];  Service: Gastroenterology;  Laterality: N/A;   ESOPHAGOGASTRODUODENOSCOPY (EGD) WITH PROPOFOL N/A 08/10/2021   Procedure: ESOPHAGOGASTRODUODENOSCOPY (EGD) WITH PROPOFOL;  Surgeon: Gupta, Rajesh, MD;  [facility];  Service: Gastroenterology;  Laterality: N/A;   ESOPHAGOGASTRODUODENOSCOPY (EGD) WITH PROPOFOL N/A 08/02/2021   Procedure: ESOPHAGOGASTRODUODENOSCOPY (EGD) WITH PROPOFOL;  Surgeon: Lysander Calixte V, DO;  [facility];  Service: Gastroenterology;  Laterality: N/A;   ESOPHAGOGASTRODUODENOSCOPY (EGD) WITH PROPOFOL N/A 04/02/2022   Procedure: ESOPHAGOGASTRODUODENOSCOPY (EGD) WITH PROPOFOL;  Surgeon: Mansouraty, Gabriel Jr., MD;  [facility];  Service: Gastroenterology;  Laterality: N/A;   ESOPHAGOGASTRODUODENOSCOPY (EGD) WITH PROPOFOL N/A 05/17/2022   Procedure: ESOPHAGOGASTRODUODENOSCOPY (EGD) WITH PROPOFOL;  Surgeon: Mansouraty, Gabriel Jr., MD;  [facility]: [facility];  Service: Gastroenterology;  Laterality: N/A;   EUS N/A 05/17/2022   Procedure: UPPER ENDOSCOPIC ULTRASOUND (EUS) RADIAL;  Surgeon: Mansouraty, Gabriel Jr., MD;  [facility]: [facility];  Service: Gastroenterology;  Laterality: N/A;   FINE NEEDLE ASPIRATION  04/02/2022   Procedure: FINE NEEDLE ASPIRATION (FNA) LINEAR;  Surgeon: Mansouraty, Gabriel Jr., MD;  [facility];  Service:  Gastroenterology;;   FINE NEEDLE ASPIRATION N/A 05/17/2022   Procedure: FINE NEEDLE ASPIRATION (FNA) LINEAR;  Surgeon: Mansouraty, Gabriel Jr., MD;  [facility]: [facility];  Service: Gastroenterology;  Laterality: N/A;   HEMOSTASIS CLIP PLACEMENT  05/29/2021   Procedure: HEMOSTASIS CLIP PLACEMENT;  Surgeon: Jacobs, Daniel P, MD;  [facility];  Service: Gastroenterology;;   HOT HEMOSTASIS N/A 05/29/2021   Procedure: HOT HEMOSTASIS (ARGON PLASMA COAGULATION/BICAP);  Surgeon: Jacobs, Daniel P, MD;  [facility];  Service: Gastroenterology;  Laterality: N/A;   HOT HEMOSTASIS N/A 06/28/2021   Procedure: HOT HEMOSTASIS (ARGON PLASMA COAGULATION/BICAP);  Surgeon: Pyrtle, Jay M, MD;  [facility];  Service: Gastroenterology;  Laterality: N/A;   HOT HEMOSTASIS N/A 08/10/2021   Procedure: HOT HEMOSTASIS (ARGON PLASMA COAGULATION/BICAP);  Surgeon: Gupta, Rajesh, MD;  [facility];  Service: Gastroenterology;  Laterality: N/A;   HOT HEMOSTASIS N/A 08/02/2021   Procedure: HOT HEMOSTASIS (ARGON PLASMA COAGULATION/BICAP);  Surgeon: Braylon Lemmons V, DO;  [facility];  Service: Gastroenterology;  Laterality: N/A;   IR PARACENTESIS  07/04/2022   IR PERC TUN PERIT CATH WO PORT S&I /IMAG  05/23/2021   IR REMOVAL TUN CV CATH W/O FL  06/10/2021   IR US GUIDE VASC ACCESS RIGHT  05/23/2021   LAPAROSCOPY N/A 05/24/2021   Procedure: LAPAROSCOPY DIAGNOSTIC;  Surgeon: Wakefield, Matthew, MD;  [facility];  Service: General;  Laterality: N/A;   LAPAROTOMY N/A 05/24/2021   Procedure: EXPLORATORY LAPAROTOMY;  Surgeon: Wakefield, Matthew, MD;  [facility];  Service: General;  Laterality: N/A;   LEFT AND RIGHT HEART CATHETERIZATION WITH CORONARY ANGIOGRAM N/A 06/11/2011   Procedure: LEFT AND RIGHT HEART CATHETERIZATION WITH CORONARY ANGIOGRAM;  Surgeon: Dalton S McLean, MD;  [facility];  Service: Cardiovascular;  Laterality: N/A;   PERIPHERAL VASCULAR  BALLOON ANGIOPLASTY Right 07/29/2020   Procedure: PERIPHERAL VASCULAR BALLOON ANGIOPLASTY;  Surgeon: Dickson, Christopher S, MD;  [facility];  Service: Cardiovascular;  Laterality: Right;  arm fistula   REMOVAL OF STONES  05/17/2022   Procedure: REMOVAL OF STONES;  Surgeon: Mansouraty, Gabriel Jr., MD;  Location: [facility];  Service: Gastroenterology;;   REVISON OF ARTERIOVENOUS FISTULA Right 08/08/2020   Procedure: ANEURYSM EXCISION OF RIGHT UPPER EXTREMITY ARTERIOVENOUS FISTULA;  Surgeon: Dickson, Christopher S, MD;  [facility];  Service: Vascular;  Laterality: Right;   REVISON OF ARTERIOVENOUS FISTULA Right 03/21/2022   Procedure: EXCISION OF ULCERATED SKIN OF RIGHT ARTERIOVENOUS FISTULA REVISION;  Surgeon: Clark, Christopher J, MD;  [facility];  Service: Vascular;  Laterality: Right;   REVISON OF ARTERIOVENOUS FISTULA Right 07/02/2022   Procedure: EXCISION OF ULCERS RIGHT ARTERIOVENOUS FISTULA;  Surgeon: Dickson, Christopher S, MD;  [facility];  Service: Vascular;  Laterality: Right;   SCLEROTHERAPY  05/29/2021   Procedure: SCLEROTHERAPY;  Surgeon: Jacobs, Daniel P, MD;  [facility];  Service: Gastroenterology;;   SPHINCTEROTOMY  04/04/2022   Procedure: SPHINCTEROTOMY;  Surgeon: Stark, Malcolm T, MD;  [facility];  Service: Gastroenterology;;   STENT REMOVAL  05/29/2021   Procedure: STENT REMOVAL;  Surgeon: Jacobs, Daniel P, MD;  [facility];  Service: Gastroenterology;;   STENT REMOVAL  05/17/2022   Procedure: STENT REMOVAL;  Surgeon: Mansouraty, Gabriel Jr., MD;  Location: [facility];  Service: Gastroenterology;;   TEE WITHOUT CARDIOVERSION N/A 12/27/2015   Procedure: TRANSESOPHAGEAL ECHOCARDIOGRAM (TEE);  Surgeon: Traci R Turner, MD;  [facility];  Service: Cardiovascular;  Laterality: N/A;   UPPER ESOPHAGEAL ENDOSCOPIC ULTRASOUND (EUS) N/A 04/02/2022   Procedure: UPPER ESOPHAGEAL ENDOSCOPIC ULTRASOUND (EUS);   Surgeon: Mansouraty, Gabriel Jr., MD;  [facility];  Service: Gastroenterology;  Laterality: N/A;    Prior to Admission medications   Medication Sig Start Date End Date Taking? Authorizing Provider  acetaminophen (TYLENOL) 500 MG tablet Take 1,500 mg by mouth 3 (three) times daily as needed for mild pain, moderate pain, fever or headache.   Yes [provider]  allopurinol (ZYLOPRIM) 100 MG tablet TAKE 1 TABLET BY MOUTH EVERY DAY 07/02/22  Yes Paseda, Folashade R, FNP  AURYXIA 1 GM 210 MG(Fe) tablet Take 210-420 mg by mouth See admin instructions. 420 mg with every meal, 210 mg with each snack. 02/11/21  Yes [provider]  cinacalcet (SENSIPAR) 30 MG tablet Take 1 tablet (30 mg total) by mouth Every Tuesday,Thursday,and Saturday with dialysis. 06/12/22  Yes Shahmehdi, Seyed A, MD  diphenhydramine-acetaminophen (TYLENOL PM) 25-500 MG TABS tablet Take 2 tablets by mouth at bedtime as needed (sleep/pain). 09/26/22  Yes Nichols, Tonya S, NP  folic acid (FOLVITE) 1 MG tablet Take 1 tablet (1 mg total) by mouth daily. 06/04/22  Yes Hollis, Lachina M, FNP  hydroxyurea (HYDREA) 500 MG capsule Take 1 capsule (500 mg total) by mouth daily. 06/01/22  Yes Jegede, Olugbemiga E, MD  Iron-Vitamins (GERITOL PO) Take 15 mLs by mouth   daily.   Yes [provider]  lidocaine-prilocaine (EMLA) cream Apply 1 application. topically Every Tuesday,Thursday,and Saturday with dialysis. 09/08/19  Yes [provider]  Oxycodone HCl 10 MG TABS Take 1 tablet (10 mg total) by mouth every 6 (six) hours as needed. 10/12/22  Yes Hollis, Lachina M, FNP  pantoprazole (PROTONIX) 40 MG tablet Take 1 tablet (40 mg total) by mouth daily. 05/17/22  Yes Mansouraty, Gabriel Jr., MD  tiZANidine (ZANAFLEX) 4 MG tablet TAKE 1/2 TABLET (2MG TOTAL) BY MOUTH EVERY 8 HOURS AS NEEDED FOR MUSCLE SPASM Patient taking differently: Take 2 mg by mouth every 6 (six) hours as needed for muscle spasms. 10/12/22  Yes  Hollis, Lachina M, FNP  Torsemide 40 MG TABS Take 40 mg by mouth in the morning and at bedtime. 09/26/22 10/26/22 Yes Nichols, Tonya S, NP  Vitamin D, Ergocalciferol, (DRISDOL) 1.25 MG (50000 UNIT) CAPS capsule Take 1 capsule (50,000 Units total) by mouth once a week. Monday Patient not taking: Reported on 08/01/2022 04/08/22   Alekh, Kshitiz, MD    Current Facility-Administered Medications  Medication Dose Route Frequency Provider Last Rate Last Admin   albumin human 25 % solution 25 g  25 g Intravenous Q6H Danford, Christopher P, MD       cefTRIAXone (ROCEPHIN) 2 g in sodium chloride 0.9 % 100 mL IVPB  2 g Intravenous Q24H Hall, Carole N, DO   Stopped at 10/20/22 2228   folic acid (FOLVITE) tablet 1 mg  1 mg Oral Daily Hall, Carole N, DO   1 mg at 10/21/22 0900   HYDROmorphone (DILAUDID) injection 0.5 mg  0.5 mg Intravenous Q2H PRN Opyd, Timothy S, MD   0.5 mg at 10/21/22 1056   hydroxyurea (HYDREA) capsule 500 mg  500 mg Oral Daily Hall, Carole N, DO   500 mg at 10/21/22 0900   lactulose (CHRONULAC) 10 GM/15ML solution 10 g  10 g Oral BID Hall, Carole N, DO   10 g at 10/21/22 0900   melatonin tablet 3 mg  3 mg Oral QHS PRN Hall, Carole N, DO       Oral care mouth rinse  15 mL Mouth Rinse PRN Hall, Carole N, DO       pantoprazole (PROTONIX) injection 40 mg  40 mg Intravenous BID Hall, Carole N, DO   40 mg at 10/21/22 0900   polyethylene glycol (MIRALAX / GLYCOLAX) packet 17 g  17 g Oral Daily PRN Hall, Carole N, DO       prochlorperazine (COMPAZINE) injection 5 mg  5 mg Intravenous Q6H PRN Hall, Carole N, DO        Allergies as of 10/20/2022   (No Known Allergies)    Family History  Problem Relation Age of Onset   Alcohol abuse Mother    Liver disease Mother    Cirrhosis Mother    Heart attack Mother     Social History   Socioeconomic History   Marital status: Divorced    Spouse name: Not on file   Number of children: Not on file   Years of education: Not on file   Highest  education level: Not on file  Occupational History   Occupation: disabled  Tobacco Use   Smoking status: Former    Packs/day: 0.50    Years: 41.00    Additional pack years: 0.00    Total pack years: 20.50    Types: Cigarettes    Quit date: 12/08/2015    Years since quitting: 6.8     Smokeless tobacco: Never  Vaping Use   Vaping Use: Never used  Substance and Sexual Activity   Alcohol use: Not Currently    Alcohol/week: 3.0 standard drinks of alcohol    Types: 1 Glasses of wine, 2 Cans of beer per week    Comment: none since starting dialysis 06/2021   Drug use: No   Sexual activity: Not Currently  Other Topics Concern   Not on file  Social History Narrative   Not on file   Social Determinants of Health   Financial Resource Strain: Low Risk  (09/27/2022)   Overall Financial Resource Strain (CARDIA)    Difficulty of Paying Living Expenses: Not hard at all  Food Insecurity: No Food Insecurity (10/20/2022)   Hunger Vital Sign    Worried About Running Out of Food in the Last Year: Never true    Ran Out of Food in the Last Year: Never true  Transportation Needs: No Transportation Needs (10/20/2022)   PRAPARE - Transportation    Lack of Transportation (Medical): No    Lack of Transportation (Non-Medical): No  Physical Activity: Inactive (09/27/2022)   Exercise Vital Sign    Days of Exercise per Week: 0 days    Minutes of Exercise per Session: 0 min  Stress: No Stress Concern Present (09/27/2022)   [employer] - Occupational Stress Questionnaire    Feeling of Stress : Not at all  Social Connections: Socially Isolated (09/27/2022)   Social Connection and Isolation Panel [NHANES]    Frequency of Communication with Friends and Family: More than three times a week    Frequency of Social Gatherings with Friends and Family: More than three times a week    Attends Religious Services: Never    Active Member of Clubs or Organizations: No    Attends Club or  Organization Meetings: Never    Marital Status: Divorced  Intimate Partner Violence: Not At Risk (10/20/2022)   Humiliation, Afraid, Rape, and Kick questionnaire    Fear of Current or Ex-Partner: No    Emotionally Abused: No    Physically Abused: No    Sexually Abused: No    Review of Systems: Pertinent positive and negative review of systems were noted in the above HPI section.  All other review of systems was otherwise negative.   Physical Exam: Vital signs in last 24 hours: Temp:  [97.6 F (36.4 C)-98.6 F (37 C)] 98.2 F (36.8 C) (05/05 0920) Pulse Rate:  [50-163] 88 (05/05 0920) Resp:  [6-23] 16 (05/05 0920) BP: (72-108)/(43-83) 104/71 (05/05 0920) SpO2:  [93 %-100 %] 98 % (05/05 0920) Weight:  [62.1 kg-62.6 kg] 62.6 kg (05/05 0124) Last BM Date : 10/20/22 General:   Alert,  Well-developed, thin, chronically ill-appearing older African-American male pleasant and cooperative in NAD Head:  Normocephalic and atraumatic. Eyes:  Sclera clear, no icterus.   Conjunctiva pink. Ears:  Normal auditory acuity. Nose:  No deformity, discharge,  or lesions. Mouth:  No deformity or lesions.   Neck:  Supple; no masses or thyromegaly. Lungs:  Clear throughout to auscultation.   No wheezes, crackles, or rhonchi . Heart:  Regular rate and rhythm; no murmurs, clicks, rubs,  or gallops. Abdomen: Protuberant, tense ascites, there is some tenderness in the right upper quadrant into the right mid quadrant, no palpable mass or hepatosplenomegaly, midline incisional scars Rectal:not  Done-documented heme positive Msk:  Symmetrical without gross deformities. . Pulses:  Normal pulses noted. Extremities:  Without clubbing or edema. Neurologic:    Alert and  oriented x4;  grossly normal neurologically.  Patient is blind Skin:  Intact without significant lesions or rashes.. Psych:  Alert and cooperative. Normal mood and affect.  Intake/Output from previous day: 05/04 0701 - 05/05 0700 In: 976.4  [Blood:795.2; IV Piggyback:181.2] Out: -  Intake/Output this shift: No intake/output data recorded.  Lab Results: Recent Labs    10/20/22 1700 10/21/22 0730  WBC 5.1 4.3  HGB 5.8* 8.0*  HCT 15.5* 20.8*  PLT 84* 85*   BMET Recent Labs    10/20/22 1709 10/21/22 0730  NA 135 135  K 4.0 4.2  CL 94* 90*  CO2 26 25  GLUCOSE 98 95  BUN 40* 45*  CREATININE 5.93* 6.87*  CALCIUM 7.8* 7.7*   LFT Recent Labs    10/21/22 0730  PROT 6.9  ALBUMIN 3.2*  AST 50*  ALT 49*  ALKPHOS 174*  BILITOT 1.6*   PT/INR No results for input(s): "LABPROT", "INR" in the last 72 hours. Hepatitis Panel No results for input(s): "HEPBSAG", "HCVAB", "HEPAIGM", "HEPBIGM" in the last 72 hours.   IMPRESSION:  #1 66-year-old African-American male, with end-stage renal disease on hemodialysis noted after dialysis yesterday with complaints of right upper quadrant pain and shortness of breath. This is in the setting of progressive anemia requiring 1 unit of packed RBCs during dialysis on 10/18/2022 and known history of decompensated cirrhosis  CT scan is pertinent for a cirrhotic appearing liver, status postcholecystectomy, small amount of pneumobilia (has had prior ERCP) no ductal dilation, increased ascites  His right upper quadrant pain may be secondary to significant ascites with tense abdomen, he is not having any diffuse abdominal pain, doubt SBP but cannot rule out  #2 acute on chronic anemia, heme positive stool.  Suspect she has been having some GI bleeding over the past week or so and has required 3 units of packed RBCs in the past few days. He has had prior history of gastric ulcer, and gastric AVMs which have required treatment with APC. Rule out bleeding secondary to AVMs, consider portal gastropathy, ulcer disease versus other  #3 status post cholecystectomy in fall 2023, acute cholecystitis with empyema, subsequent ERCP for bile leak-stent placement and then stent removal November  2023  #4 sickle cell disease #5  permanent atrial fibrillation-no anticoagulation #6 congestive heart failure with EF 35 to 40% #7.  History of hypertension #8.  COPD   PLAN: Okay for diet today We will schedule for paracentesis today, remove up to 5 L, will send for Gram stain, cell counts,culture etc. Twice daily PPI IV Rocephin started on admit Continue to trend hemoglobin to 8 hours, transfuse to keep hemoglobin 7.5 Will add INR, AFP and iron studies-will calculate MELD after above results  Patient has dialysis tomorrow, we can plan for EGD on Tuesday, 10/23/2022.   GI Will follow with you.   Amy Esterwood PA-C 10/21/2022, 11:00 AM    

## 2022-10-21 NOTE — Hospital Course (Addendum)
Patrick [name] is a 67 y.o. M with ESRD on HD TThS, sCHF EF 35-40%, cirrhosis with ascites, permAF not on AC due to hx GIB from AVMs of stomach and duodenum, sickle cell disease, HTN, COPD, and hx emphysematous cholecystitis and SB ischemia in 2022 s/p cholecystectomy, SB resection and biliary stent who presented with 2 days SOB.     5/4: Found to have Hgb 5.8 in the ER, admitted and transfused, PPI and Abx started, GI consulted

## 2022-10-22 ENCOUNTER — Encounter (HOSPITAL_COMMUNITY)
Admission: EM | Disposition: A | Discharge status: Home / Self Care | Payer: Self-pay | Source: Home / Self Care | Attending: Family Medicine

## 2022-10-22 ENCOUNTER — Inpatient Hospital Stay (HOSPITAL_COMMUNITY): Payer: PPO | Admitting: Certified Registered"

## 2022-10-22 ENCOUNTER — Encounter (HOSPITAL_COMMUNITY): Payer: Self-pay | Admitting: Internal Medicine

## 2022-10-22 DIAGNOSIS — K7460 Unspecified cirrhosis of liver: Secondary | ICD-10-CM | POA: Diagnosis not present

## 2022-10-22 DIAGNOSIS — D62 Acute posthemorrhagic anemia: Secondary | ICD-10-CM

## 2022-10-22 DIAGNOSIS — K2950 Unspecified chronic gastritis without bleeding: Secondary | ICD-10-CM | POA: Diagnosis not present

## 2022-10-22 DIAGNOSIS — Z87891 Personal history of nicotine dependence: Secondary | ICD-10-CM

## 2022-10-22 DIAGNOSIS — I4891 Unspecified atrial fibrillation: Secondary | ICD-10-CM

## 2022-10-22 DIAGNOSIS — D649 Anemia, unspecified: Secondary | ICD-10-CM | POA: Diagnosis not present

## 2022-10-22 DIAGNOSIS — K259 Gastric ulcer, unspecified as acute or chronic, without hemorrhage or perforation: Secondary | ICD-10-CM

## 2022-10-22 DIAGNOSIS — I509 Heart failure, unspecified: Secondary | ICD-10-CM

## 2022-10-22 DIAGNOSIS — J449 Chronic obstructive pulmonary disease, unspecified: Secondary | ICD-10-CM

## 2022-10-22 DIAGNOSIS — K449 Diaphragmatic hernia without obstruction or gangrene: Secondary | ICD-10-CM | POA: Diagnosis not present

## 2022-10-22 HISTORY — PX: BIOPSY: SHX5522

## 2022-10-22 HISTORY — PX: ESOPHAGOGASTRODUODENOSCOPY (EGD) WITH PROPOFOL: SHX5813

## 2022-10-22 LAB — TYPE AND SCREEN: Unit division: 0

## 2022-10-22 LAB — COMPREHENSIVE METABOLIC PANEL
ALT: 37 U/L (ref 0–44)
AST: 32 U/L (ref 15–41)
Albumin: 3.3 g/dL — ABNORMAL LOW (ref 3.5–5.0)
Alkaline Phosphatase: 161 U/L — ABNORMAL HIGH (ref 38–126)
Anion gap: 16 — ABNORMAL HIGH (ref 5–15)
BUN: 53 mg/dL — ABNORMAL HIGH (ref 8–23)
CO2: 25 mmol/L (ref 22–32)
Calcium: 7.8 mg/dL — ABNORMAL LOW (ref 8.9–10.3)
Chloride: 94 mmol/L — ABNORMAL LOW (ref 98–111)
Creatinine, Ser: 8.28 mg/dL — ABNORMAL HIGH (ref 0.61–1.24)
GFR, Estimated: 7 mL/min — ABNORMAL LOW (ref >60)
Glucose, Bld: 103 mg/dL — ABNORMAL HIGH (ref 70–99)
Potassium: 4 mmol/L (ref 3.5–5.1)
Sodium: 135 mmol/L (ref 135–145)
Total Bilirubin: 1.7 mg/dL — ABNORMAL HIGH (ref 0.3–1.2)
Total Protein: 6.6 g/dL (ref 6.5–8.1)

## 2022-10-22 LAB — BPAM RBC
ISSUE DATE / TIME: 202405042214
Unit Type and Rh: 1700

## 2022-10-22 LAB — HEMOGLOBIN AND HEMATOCRIT, BLOOD
HCT: 20.3 % — ABNORMAL LOW (ref 39.0–52.0)
HCT: 22 % — ABNORMAL LOW (ref 39.0–52.0)
Hemoglobin: 7.5 g/dL — ABNORMAL LOW (ref 13.0–17.0)
Hemoglobin: 8.2 g/dL — ABNORMAL LOW (ref 13.0–17.0)

## 2022-10-22 SURGERY — ESOPHAGOGASTRODUODENOSCOPY (EGD) WITH PROPOFOL
Anesthesia: Monitor Anesthesia Care

## 2022-10-22 MED ORDER — BISACODYL 10 MG RE SUPP
10.0000 mg | Freq: Once | RECTAL | Status: AC
  Administered 2022-10-22: 10 mg via RECTAL
  Filled 2022-10-22: qty 1

## 2022-10-22 MED ORDER — PROPOFOL 500 MG/50ML IV EMUL
INTRAVENOUS | Status: DC | PRN
  Administered 2022-10-22: 100 ug/kg/min via INTRAVENOUS

## 2022-10-22 MED ORDER — SODIUM CHLORIDE 0.9 % IV SOLN: INTRAVENOUS | Status: DC

## 2022-10-22 MED ORDER — PANTOPRAZOLE SODIUM 40 MG PO TBEC
40.0000 mg | DELAYED_RELEASE_TABLET | Freq: Two times a day (BID) | ORAL | Status: DC
  Administered 2022-10-22 – 2022-10-23 (×2): 40 mg via ORAL
  Filled 2022-10-22 (×2): qty 1

## 2022-10-22 MED ORDER — LACTULOSE 10 GM/15ML PO SOLN
30.0000 g | Freq: Once | ORAL | Status: AC
  Administered 2022-10-22: 30 g via ORAL
  Filled 2022-10-22: qty 45

## 2022-10-22 MED ORDER — PHENYLEPHRINE 80 MCG/ML (10ML) SYRINGE FOR IV PUSH (FOR BLOOD PRESSURE SUPPORT)
PREFILLED_SYRINGE | INTRAVENOUS | Status: DC | PRN
  Administered 2022-10-22 (×2): 160 ug via INTRAVENOUS
  Administered 2022-10-22: 240 ug via INTRAVENOUS

## 2022-10-22 SURGICAL SUPPLY — 15 items

## 2022-10-22 NOTE — Progress Notes (Signed)
  Progress Note   Patient: Patrick [name]:[id number] DOB: [birth year] DOA: 10/20/2022     1 DOS: the patient was seen and examined on 10/22/2022        Brief hospital course: Mr. Arwine is a 67 y.o. M with ESRD on HD TThS, sCHF EF 35-40%, cirrhosis with ascites, permAF not on AC due to hx GIB from AVMs of stomach and duodenum, sickle cell disease, HTN, COPD, and hx emphysematous cholecystitis and SB ischemia in 2022 s/p cholecystectomy, SB resection and biliary stent who presented with 2 days SOB.     5/4: Found to have Hgb 5.8 in the ER, admitted and transfused, PPI and Abx started, GI consulted 5/6: EGD showed gastritis only, GI signed off    Assessment and Plan: * Symptomatic anemia Hgb stable.  Cirrhosis (HCC) US showed no ascites. - Continue lactulose - Hold torsemide - No indication for Rocephin, will stop   Abdominal pain Patient with history cholecystectomy. CT abdomen on admission showed no acute disease.  But still has persistent RUQ abdominal pain and feeling distended and bloated.   - Repeat US - Repeat CMP  Persistent atrial fibrillation (HCC) Not on anticoagulation at baseline.  Rate controlled here off meds.  ESRD on HD TTS - Hold torsemide   - Consult Nephrology for HD  Chronic systolic CHF (congestive heart failure) (HCC) Appears euvolemic.  Low blood pressure precludes GDMT. - Hold torsemide    Thrombocytopenia (HCC) Due to cirrhosis, stable from baseline  Chronic hypotension    Chronic pain syndrome - Continue home oxycodone  COPD (chronic obstructive pulmonary disease) (HCC) No active disease  Protein-calorie malnutrition, moderate (HCC)    Sickle cell disease (HCC) - Continue hydroxyurea          Subjective: Still with abdominal pain, no fever, no vmoiting, no icterus, no confusion     Physical Exam: BP (!) 89/49 (BP Location: Left Arm)   Pulse 65   Temp 98.6 F (37 C) (Oral)   Resp 16   Ht 5\' 11"  (1.803 m)    Wt 62.6 kg   SpO2 98%   BMI 19.25 kg/m   Adult male, weak and tired RRR no murmurs no LE edema Respiratory rate normal Abdomen distended and rotund, tender on the right, no masses, no rigidity, no rebound Attention normal, oriented to person, place and time  Data Reviewed: EGD report reviewed, gastritis only CBC shows stable Hgb  Family Communication: None    Disposition: Status is: Inpatient Observe overnight, if Hgb stable, tolerates diet and bowel function resumes, may dicharge tomorrow, otherwise will repeat imaging.         Author: Alberteen Sam, MD 10/22/2022 4:51 PM  For on call review [email or web address].

## 2022-10-22 NOTE — Anesthesia Preprocedure Evaluation (Signed)
Anesthesia Evaluation  Patient identified by MRN, date of birth, ID band Patient awake    Reviewed: Allergy & Precautions, H&P , NPO status , Patient's Chart, lab work & pertinent test results  Airway Mallampati: II   Neck ROM: full    Dental   Pulmonary shortness of breath, COPD, former smoker   breath sounds clear to auscultation       Cardiovascular +CHF  + dysrhythmias Atrial Fibrillation  Rhythm:regular Rate:Normal     Neuro/Psych    GI/Hepatic hiatal hernia,GERD  ,,(+) Cirrhosis         Endo/Other    Renal/GU ESRFRenal disease     Musculoskeletal   Abdominal   Peds  Hematology  (+) Blood dyscrasia, anemia Hemoglobin 7.5   Anesthesia Other Findings   Reproductive/Obstetrics                             Anesthesia Physical Anesthesia Plan  ASA: 4  Anesthesia Plan: MAC   Post-op Pain Management:    Induction: Intravenous  PONV Risk Score and Plan: 1 and Propofol infusion and Treatment may vary due to age or medical condition  Airway Management Planned: Nasal Cannula  Additional Equipment:   Intra-op Plan:   Post-operative Plan:   Informed Consent: I have reviewed the patients History and Physical, chart, labs and discussed the procedure including the risks, benefits and alternatives for the proposed anesthesia with the patient or authorized representative who has indicated his/her understanding and acceptance.     Dental advisory given  Plan Discussed with: CRNA, Anesthesiologist and Surgeon  Anesthesia Plan Comments:        Anesthesia Quick Evaluation

## 2022-10-22 NOTE — Anesthesia Procedure Notes (Signed)
Procedure Name: MAC Date/Time: 10/22/2022 10:05 AM  Performed by: Macie Burows, CRNAPre-anesthesia Checklist: Patient identified, Emergency Drugs available, Suction available, Patient being monitored and Timeout performed Patient Re-evaluated:Patient Re-evaluated prior to induction Oxygen Delivery Method: Nasal cannula Induction Type: IV induction Airway Equipment and Method: Bite block Placement Confirmation: positive ETCO2 and breath sounds checked- equal and bilateral Dental Injury: Teeth and Oropharynx as per pre-operative assessment

## 2022-10-22 NOTE — Transfer of Care (Signed)
Immediate Anesthesia Transfer of Care Note  Patient: Patrick [name]  Procedure(s) Performed: ESOPHAGOGASTRODUODENOSCOPY (EGD) WITH PROPOFOL BIOPSY  Patient Location: Endoscopy Unit  Anesthesia Type:MAC  Level of Consciousness: awake, alert , and oriented  Airway & Oxygen Therapy: Patient Spontanous Breathing  Post-op Assessment: Report given to RN and Post -op Vital signs reviewed and stable  Post vital signs: Reviewed and stable  Last Vitals:  Vitals Value Taken Time  BP 85/51 10/22/22 1026  Temp 36.6 C 10/22/22 1025  Pulse 79 10/22/22 1028  Resp 12 10/22/22 1028  SpO2 100 % 10/22/22 1028  Vitals shown include unvalidated device data.  Last Pain:  Vitals:   10/22/22 1025  TempSrc: Temporal  PainSc: 0-No pain      Patients Stated Pain Goal: 0 (10/21/22 1330)  Complications: No notable events documented.

## 2022-10-22 NOTE — Op Note (Signed)
[facility] Patient Name: Patrick [name] Procedure Date : 10/22/2022 MRN: [id number] Attending MD: Beverley Fiedler , MD, [id number] Date of Birth: 09-14-1955 CSN: [id number] Age: 67 Admit Type: Inpatient Procedure:                Upper GI endoscopy Indications:              Acute on chronic blood loss anemia, personal hx of                            gastric and duodenal angioectasias Providers:                Carie Caddy. Rhea Belton, MD, Fransisca Connors, Beryle Beams,                            Technician Referring MD:             [facility] Medicines:                Monitored Anesthesia Care Complications:            No immediate complications. Estimated Blood Loss:     Estimated blood loss was minimal. Procedure:                Pre-Anesthesia Assessment:                           - Prior to the procedure, a History and Physical                            was performed, and patient medications and                            allergies were reviewed. The patient's tolerance of                            previous anesthesia was also reviewed. The risks                            and benefits of the procedure and the sedation                            options and risks were discussed with the patient.                            All questions were answered, and informed consent                            was obtained. Prior Anticoagulants: The patient has                            taken no anticoagulant or antiplatelet agents. ASA                            Grade Assessment: III - A patient with severe  systemic disease. After reviewing the risks and                            benefits, the patient was deemed in satisfactory                            condition to undergo the procedure.                           After obtaining informed consent, the endoscope was                            passed under direct vision. Throughout the                             procedure, the patient's blood pressure, pulse, and                            oxygen saturations were monitored continuously. The                            [id number]-[id number] ([id number]) Olympus endoscope was introduced                            through the mouth, and advanced to the second part                            of duodenum. The upper GI endoscopy was                            accomplished without difficulty. The patient                            tolerated the procedure well. Scope In: Scope Out: Findings:      The examined esophagus was normal.      A 2 cm hiatal hernia was present.      Moderate inflammation characterized by erosions, granularity and shallow       ulcerations was found in the gastric antrum. Biopsies were taken with a       cold forceps for histology and Helicobacter pylori testing.      The exam of the stomach was otherwise normal.      The examined duodenum was normal. Impression:               - Normal esophagus.                           - 2 cm hiatal hernia.                           - Acute gastritis with ulcerations and erosions.                            Biopsied to exclude H. Pylori.                           -  Normal examined duodenum. Moderate Sedation:      N/A Recommendation:           - Return patient to hospital ward for ongoing care.                           - Advance diet as tolerated.                           - Continue present medications. BID PPI, likely                            indefinitely.                           - No NSAIDs.                           - Await pathology results to exclude H. Pylori.                           - GI will follow-up gastric biopsy results. GI                            signing off, call if questions. Procedure Code(s):        --- Professional ---                           [phone], Esophagogastroduodenoscopy, flexible,                            transoral; with biopsy, single or  multiple Diagnosis Code(s):        --- Professional ---                           K44.9, Diaphragmatic hernia without obstruction or                            gangrene                           K29.00, Acute gastritis without bleeding                           D62, Acute posthemorrhagic anemia CPT copyright 2022 [employer]. All rights reserved. The codes documented in this report are preliminary and upon coder review may  be revised to meet current compliance requirements. Beverley Fiedler, MD 10/22/2022 10:31:48 AM This report has been signed electronically. Number of Addenda: 0

## 2022-10-22 NOTE — Interval H&P Note (Signed)
History and Physical Interval Note: For EGD today to evaluate acute on chronic anemia with known prior gastric duodenal angioectasias. The nature of the procedure, as well as the risks, benefits, and alternatives were carefully and thoroughly reviewed with the patient. Ample time for discussion and questions allowed. The patient understood, was satisfied, and agreed to proceed.   10/22/2022 9:49 AM  Patrick [name]  has presented today for surgery, with the diagnosis of Cirrhosis, heme positive stool, anemia, history of AVMs and ulcers.  The various methods of treatment have been discussed with the patient and family. After consideration of risks, benefits and other options for treatment, the patient has consented to  Procedure(s): ESOPHAGOGASTRODUODENOSCOPY (EGD) WITH PROPOFOL (N/A) as a surgical intervention.  The patient's history has been reviewed, patient examined, no change in status, stable for surgery.  I have reviewed the patient's chart and labs.  Questions were answered to the patient's satisfaction.     Carie Caddy Bassam Dresch

## 2022-10-23 ENCOUNTER — Encounter (HOSPITAL_COMMUNITY): Payer: Self-pay

## 2022-10-23 ENCOUNTER — Inpatient Hospital Stay (HOSPITAL_COMMUNITY): Payer: PPO

## 2022-10-23 ENCOUNTER — Other Ambulatory Visit (HOSPITAL_COMMUNITY): Payer: Self-pay

## 2022-10-23 DIAGNOSIS — N186 End stage renal disease: Secondary | ICD-10-CM | POA: Diagnosis not present

## 2022-10-23 DIAGNOSIS — D57819 Other sickle-cell disorders with crisis, unspecified: Secondary | ICD-10-CM | POA: Diagnosis not present

## 2022-10-23 DIAGNOSIS — D5700 Hb-SS disease with crisis, unspecified: Secondary | ICD-10-CM | POA: Diagnosis not present

## 2022-10-23 DIAGNOSIS — D571 Sickle-cell disease without crisis: Secondary | ICD-10-CM | POA: Diagnosis not present

## 2022-10-23 DIAGNOSIS — Z992 Dependence on renal dialysis: Secondary | ICD-10-CM | POA: Diagnosis not present

## 2022-10-23 DIAGNOSIS — D649 Anemia, unspecified: Secondary | ICD-10-CM | POA: Diagnosis not present

## 2022-10-23 DIAGNOSIS — N2581 Secondary hyperparathyroidism of renal origin: Secondary | ICD-10-CM | POA: Diagnosis not present

## 2022-10-23 DIAGNOSIS — D57219 Sickle-cell/Hb-C disease with crisis, unspecified: Secondary | ICD-10-CM | POA: Diagnosis not present

## 2022-10-23 DIAGNOSIS — D631 Anemia in chronic kidney disease: Secondary | ICD-10-CM | POA: Diagnosis not present

## 2022-10-23 LAB — COMPREHENSIVE METABOLIC PANEL
ALT: 28 U/L (ref 0–44)
AST: 23 U/L (ref 15–41)
Albumin: 2.9 g/dL — ABNORMAL LOW (ref 3.5–5.0)
Alkaline Phosphatase: 141 U/L — ABNORMAL HIGH (ref 38–126)
Anion gap: 15 (ref 5–15)
BUN: 66 mg/dL — ABNORMAL HIGH (ref 8–23)
CO2: 24 mmol/L (ref 22–32)
Calcium: 8.2 mg/dL — ABNORMAL LOW (ref 8.9–10.3)
Chloride: 95 mmol/L — ABNORMAL LOW (ref 98–111)
Creatinine, Ser: 9.56 mg/dL — ABNORMAL HIGH (ref 0.61–1.24)
GFR, Estimated: 6 mL/min — ABNORMAL LOW (ref >60)
Glucose, Bld: 102 mg/dL — ABNORMAL HIGH (ref 70–99)
Potassium: 4.1 mmol/L (ref 3.5–5.1)
Sodium: 134 mmol/L — ABNORMAL LOW (ref 135–145)
Total Bilirubin: 1.2 mg/dL (ref 0.3–1.2)
Total Protein: 6.1 g/dL — ABNORMAL LOW (ref 6.5–8.1)

## 2022-10-23 LAB — HEMOGLOBIN AND HEMATOCRIT, BLOOD
HCT: 19.6 % — ABNORMAL LOW (ref 39.0–52.0)
HCT: 21.8 % — ABNORMAL LOW (ref 39.0–52.0)
Hemoglobin: 7.1 g/dL — ABNORMAL LOW (ref 13.0–17.0)
Hemoglobin: 7.8 g/dL — ABNORMAL LOW (ref 13.0–17.0)

## 2022-10-23 LAB — SURGICAL PATHOLOGY

## 2022-10-23 MED ORDER — LACTULOSE 10 GM/15ML PO SOLN
20.0000 g | Freq: Two times a day (BID) | ORAL | 0 refills | Status: DC | PRN
  Filled 2022-10-23: qty 237, 4d supply, fill #0

## 2022-10-23 MED ORDER — PANTOPRAZOLE SODIUM 40 MG PO TBEC
40.0000 mg | DELAYED_RELEASE_TABLET | Freq: Two times a day (BID) | ORAL | 1 refills | Status: DC
  Filled 2022-10-23: qty 60, 30d supply, fill #0

## 2022-10-23 NOTE — Discharge Summary (Signed)
Physician Discharge Summary   Patient: Patrick [name] MRN: [id number] DOB: 09-Jan-1956  Admit date:     10/20/2022  Discharge date: 10/23/22  Discharge Physician: Alberteen Sam   PCP: Quentin Angst, MD     Recommendations at discharge:  Follow up with PCP Dr. Hyman Hopes in 1 week for anemia, constipation Dialysis Center:  Please check Hgb on Saturday and fax to Dr. Hyman Hopes  Dr. Rhea Belton to follow up gastric biopsy     Discharge Diagnoses: Principal Problem:   Symptomatic anemia Active Problems:   Persistent atrial fibrillation (HCC)   Cirrhosis (HCC)   ESRD on HD TTS   Chronic systolic CHF (congestive heart failure) (HCC)   Sickle cell disease (HCC)   Protein-calorie malnutrition, moderate (HCC)   COPD (chronic obstructive pulmonary disease) (HCC)   Chronic pain syndrome   Chronic hypotension   Thrombocytopenia (HCC)   Blindness     Hospital Course: [name] is a 67 y.o. M with ESRD on HD TThS, sickle cell disease, sCHF EF 35-40%, cirrhosis with ascites, blindness, permAF not on AC due to hx GIB from AVMs of stomach and duodenum, HTN, COPD, and hx emphysematous cholecystitis and SB ischemia in 2022 s/p cholecystectomy, SB resection and biliary stent who presented with 2 days SOB.     5/4: Found to have Hgb 5.8 in the ER, admitted and transfused, PPI and Abx started, GI consulted       * Symptomatic anemia Hgb 5.8 on admission, transfused and improved.  GI consulted and underwent EGD that showed gastritis, no other acute findings.    Suspect this is mostly anemia of renal failure in setting of sickle cell disease.  Post-transfusion, Hgb fluctuated 7.1 to 8.2, without clear trend.  Repeat CBC in few days and trend outpatient.     Cirrhosis (HCC) Appears compensated at this time.  Has lactulose PRN.  No prior hepatic encephalopathy.  INR 1.5, platelets ~90K.   - GI follow up  Abdominal pain, right upper quadrant CT obtained, showed no  evidence of SBO, pancreatitis, infection/abscess, bowel ischemia.  Tbili and WBC normal, low suspicion for cholangitis.  US abdomen showed mild ascites, absent gallbladder.  Doubt SBP.  Doubt recurrent bowel ischemia.  No sequelae at site of prior emphysematous cholecystitis.    Tolerated diet well, felt he was improving.   Persistent atrial fibrillation (HCC) Not on anticoagulation at baseline.  Rate controlled here off meds.  ESRD on HD TTS  Chronic systolic CHF (congestive heart failure) (HCC) Appears euvolemic.  Low blood pressure precludes GDMT.  Thrombocytopenia (HCC) Due to cirrhosis, stable from baseline  Chronic hypotension On midodrine   Chronic pain syndrome On daily home oxycodone  COPD (chronic obstructive pulmonary disease) (HCC) No active disease  Protein-calorie malnutrition, moderate (HCC)   Sickle cell disease (HCC) On hydroxyurea            The [place] Controlled Substances Registry was reviewed for this patient prior to discharge.   Consultants: Gastroenterology Procedures performed: EGD  Disposition: Home Diet recommendation:  Discharge Diet Orders (From admission, onward)     Start     Ordered   10/23/22 0000  Diet - low sodium heart healthy        10/23/22 1148             DISCHARGE MEDICATION: Allergies as of 10/23/2022   No Known Allergies      Medication List     TAKE these medications    acetaminophen 500  MG tablet Commonly known as: TYLENOL Take 1,500 mg by mouth 3 (three) times daily as needed for mild pain, moderate pain, fever or headache.   allopurinol 100 MG tablet Commonly known as: ZYLOPRIM TAKE 1 TABLET BY MOUTH EVERY DAY   Auryxia 1 GM 210 MG(Fe) tablet Generic drug: ferric citrate Take 210-420 mg by mouth See admin instructions. 420 mg with every meal, 210 mg with each snack.   cinacalcet 30 MG tablet Commonly known as: SENSIPAR Take 1 tablet (30 mg total) by mouth Every Tuesday,Thursday,and  Saturday with dialysis.   diphenhydramine-acetaminophen 25-500 MG Tabs tablet Commonly known as: TYLENOL PM Take 2 tablets by mouth at bedtime as needed (sleep/pain).   folic acid 1 MG tablet Commonly known as: FOLVITE Take 1 tablet (1 mg total) by mouth daily.   GERITOL PO Take 15 mLs by mouth daily.   hydroxyurea 500 MG capsule Commonly known as: HYDREA Take 1 capsule (500 mg total) by mouth daily.   lactulose 10 GM/15ML solution Commonly known as: CHRONULAC Take 30 mLs (20 g total) by mouth 2 (two) times daily as needed for mild constipation.   lidocaine-prilocaine cream Commonly known as: EMLA Apply 1 application. topically Every Tuesday,Thursday,and Saturday with dialysis.   Oxycodone HCl 10 MG Tabs Take 1 tablet (10 mg total) by mouth every 6 (six) hours as needed.   pantoprazole 40 MG tablet Commonly known as: PROTONIX Take 1 tablet (40 mg total) by mouth 2 (two) times daily before a meal. What changed: when to take this   tiZANidine 4 MG tablet Commonly known as: ZANAFLEX TAKE 1/2 TABLET (2MG  TOTAL) BY MOUTH EVERY 8 HOURS AS NEEDED FOR MUSCLE SPASM What changed:  how much to take how to take this when to take this reasons to take this additional instructions   Torsemide 40 MG Tabs Take 40 mg by mouth in the morning and at bedtime.   Vitamin D (Ergocalciferol) 1.25 MG (50000 UNIT) Caps capsule Commonly known as: DRISDOL Take 1 capsule (50,000 Units total) by mouth once a week. Monday        Follow-up Information     Center, [facility]. Go on 10/24/2022.   Why: Make-up appointment- arrive at 6:00 am for 6:20 chair time on Wednesday, May 8.  [facility] transportation appointment has been made for Wednesday. Transportation will pick up from home between 5:00-5:30 am. Transportation will pick up from clinic between 10:45-11:15 am. Contact information: [address] [place] [facility] [address] [phone]         Quentin Angst, MD. Schedule  an appointment as soon as possible for a visit in 1 week(s).   Specialty: Internal Medicine Contact information: [address] [place] [address] [phone]                 Discharge Instructions     Diet - low sodium heart healthy   Complete by: As directed    Discharge instructions   Complete by: As directed    **IMPORTANT DISCHARGE INSTRUCTIONS**   From Dr. Maryfrances Bunnell: You were admitted for shortness of breath and found to have anemia.  You were given a blood transfusion and the anemia improved.  Continue your home iron supplement Auryxia If you don't have any of this at home, call your primary doctor, Dr. Hyman Hopes (number listed below)  Have the dialysis center check your hemoglobin (blood level) on Saturday  ALSO: You had an endoscopy while you were here, to look in your stomach Dr.  Pyrtle (the GI doctor who did the procedure) took a sample of the stomach wall to make sure there wasn't cancer. Call their office in 1 week for results, if you haven't already heard from them  AND --> take the medicine pantoprazole/Protonix 40 mg TWICE daily from now on, not just once daily  For constipation, take the medicine Lactulose 30mL up to twice daily to have a BM  Resume all your other home medicines  Go to Dialysis tomorrow morning for the extra session, then back to your normal schedule  Call Dr Hyman Hopes for an appointment next week   Increase activity slowly   Complete by: As directed        Discharge Exam: Filed Weights   10/20/22 1552 10/21/22 0124 10/22/22 0912  Weight: 62.1 kg 62.6 kg 62.6 kg    General: Pt is alert, awake, not in acute distress, blind Cardiovascular: RRR, nl S1-S2, no murmurs appreciated.   No LE edema.   Respiratory: Normal respiratory rate and rhythm.  CTAB without rales or wheezes. Abdominal: Abdomen soft and mild right sided tenderness.  No distension or HSM.   Neuro/Psych: Strength symmetric in upper and lower extremities.   Judgment and insight appear normal.   Condition at discharge: stable  The results of significant diagnostics from this hospitalization (including imaging, microbiology, ancillary and laboratory) are listed below for reference.   Imaging Studies: US Abdomen Complete  Result Date: 10/23/2022 CLINICAL DATA:  Abdominal pain. EXAM: ABDOMEN ULTRASOUND COMPLETE COMPARISON:  CT abdomen pelvis dated Oct 20, 2022. FINDINGS: Gallbladder: Surgically absent. Common bile duct: Diameter: 7-8 mm, normal. Liver: No focal lesion identified. Unchanged nodular contour with coarsened heterogeneously increased parenchymal echogenicity. Portal vein is patent on color Doppler imaging with normal direction of blood flow towards the liver. IVC: No abnormality visualized. Pancreas: Visualized portion unremarkable. Spleen: Calcified atrophic spleen again noted. Right Kidney: Length: 8.2 cm. Atrophic with diffusely increased echogenicity. No mass or hydronephrosis visualized. Left Kidney: Length: 8.6 cm. Atrophic with diffusely increased echogenicity. No mass or hydronephrosis visualized. 1.4 cm simple cyst noted. No follow-up imaging is recommended. Abdominal aorta: No aneurysm visualized. Other findings: Unchanged small ascites. IMPRESSION: 1. No acute abnormality. 2. Unchanged cirrhosis and small ascites. 3. Unchanged bilateral renal atrophy with increased echogenicity, consistent with chronic medical renal disease. Electronically Signed   By: Obie Dredge M.D.   On: 10/23/2022 11:28   [place] ASCITES (ABDOMEN LIMITED)  Result Date: 10/21/2022 CLINICAL DATA:  Concern for intra-abdominal ascites. Please perform ascites search ultrasound and ultrasound-guided paracentesis as indicated. EXAM: LIMITED ABDOMEN ULTRASOUND FOR ASCITES TECHNIQUE: Limited ultrasound survey for ascites was performed in all four abdominal quadrants. COMPARISON:  CT abdomen pelvis-10/20/2022 FINDINGS: Sonographic evaluation of the abdomen demonstrates a trace  amount of intra-abdominal ascites, too small to allow for safe ultrasound-guided paracentesis. No paracentesis attempted. IMPRESSION: Trace amount of intra-abdominal ascites, too small to allow for safe ultrasound-guided paracentesis. No paracentesis attempted. Electronically Signed   By: Simonne Come M.D.   On: 10/21/2022 12:19   CT ABDOMEN PELVIS WO CONTRAST  Result Date: 10/20/2022 CLINICAL DATA:  Acute nonlocalized abdominal pain EXAM: CT ABDOMEN AND PELVIS WITHOUT CONTRAST TECHNIQUE: Multidetector CT imaging of the abdomen and pelvis was performed following the standard protocol without IV contrast. RADIATION DOSE REDUCTION: This exam was performed according to the departmental dose-optimization program which includes automated exposure control, adjustment of the mA and/or kV according to patient size and/or use of iterative reconstruction technique. COMPARISON:  CT abdomen and pelvis 07/03/2022  FINDINGS: Lower chest: No acute abnormality. Cardiomegaly. Trace right pleural effusion. Hepatobiliary: Cirrhotic liver morphology. Cholecystectomy. Small amount of pneumobilia. No biliary dilation. Pancreas: Pancreatic atrophy. No ductal dilation or acute inflammation. Spleen: Calcified atrophic spleen. Adrenals/Urinary Tract: Normal adrenal glands. No urinary calculi or hydronephrosis. Nondistended bladder. Stomach/Bowel: Normal caliber large and small bowel. Postoperative changes about the cecum. Normal appendix. Stomach is within normal limits. Vascular/Lymphatic: Aortic atherosclerotic calcification. No definite abdominopelvic lymphadenopathy. Reproductive: Unremarkable. Other: There is increased ascites within the lesser sac although the overall volume of abdominopelvic ascites is decreased compared with 07/03/2022. No free intraperitoneal air. Musculoskeletal: Heterogenous marrow could represent renal osteodystrophy or sequela of sickle cell anemia. No acute fracture. IMPRESSION: 1. Cirrhosis. Increased ascites  within the lesser sac although the overall volume of abdominopelvic ascites is decreased compared with 07/03/2022. 2. Cardiomegaly and trace right pleural effusion. Aortic Atherosclerosis (ICD10-I70.0). Electronically Signed   By: Minerva Fester M.D.   On: 10/20/2022 17:32   DG Chest 2 View  Result Date: 10/20/2022 CLINICAL DATA:  Chest pain, short of breath, sickle cell disease EXAM: CHEST - 2 VIEW COMPARISON:  07/23/2022 FINDINGS: Frontal and lateral views of the chest demonstrates stable enlargement of the cardiac silhouette. Diffuse increased interstitial prominence throughout the lungs likely reflects scarring, and is stable since prior study. No acute airspace disease, effusion, or pneumothorax. There are no acute bony abnormalities. IMPRESSION: 1. Chronic interstitial scarring throughout the lungs. No acute airspace disease. 2. Stable enlarged cardiac silhouette. Electronically Signed   By: Sharlet Salina M.D.   On: 10/20/2022 16:46    Microbiology: Results for orders placed or performed during the hospital encounter of 07/23/22  Culture, blood (routine x 2) Call MD if unable to obtain prior to antibiotics being given     Status: None   Collection Time: 07/24/22 12:53 AM   Specimen: BLOOD LEFT HAND  Result Value Ref Range Status   Specimen Description BLOOD LEFT HAND  Final   Special Requests   Final    BOTTLES DRAWN AEROBIC AND ANAEROBIC Blood Culture results may not be optimal due to an excessive volume of blood received in culture bottles   Culture   Final    NO GROWTH 5 DAYS Performed at [facility], [address], [place], [place] [address]    Report Status 07/29/2022 FINAL  Final  MRSA Next Gen by PCR, Nasal     Status: None   Collection Time: 07/24/22  1:00 PM   Specimen: Nasal Mucosa; Nasal Swab  Result Value Ref Range Status   MRSA by PCR Next Gen NOT DETECTED NOT DETECTED Final    Comment: (NOTE) The GeneXpert MRSA Assay (FDA approved for NASAL specimens only), is  one component of a comprehensive MRSA colonization surveillance program. It is not intended to diagnose MRSA infection nor to guide or monitor treatment for MRSA infections. Test performance is not FDA approved in patients less than 91 years old. Performed at [facility], [address] [place], [place] [address]   Respiratory (~20 pathogens) panel by PCR     Status: None   Collection Time: 07/24/22  1:00 PM   Specimen: Nasal Mucosa; Respiratory  Result Value Ref Range Status   Adenovirus NOT DETECTED NOT DETECTED Final   Coronavirus 229E NOT DETECTED NOT DETECTED Final    Comment: (NOTE) The Coronavirus on the Respiratory Panel, DOES NOT test for the novel  Coronavirus (2019 nCoV)    Coronavirus HKU1 NOT DETECTED NOT DETECTED Final  Coronavirus NL63 NOT DETECTED NOT DETECTED Final   Coronavirus OC43 NOT DETECTED NOT DETECTED Final   Metapneumovirus NOT DETECTED NOT DETECTED Final   Rhinovirus / Enterovirus NOT DETECTED NOT DETECTED Final   Influenza A NOT DETECTED NOT DETECTED Final   Influenza B NOT DETECTED NOT DETECTED Final   Parainfluenza Virus 1 NOT DETECTED NOT DETECTED Final   Parainfluenza Virus 2 NOT DETECTED NOT DETECTED Final   Parainfluenza Virus 3 NOT DETECTED NOT DETECTED Final   Parainfluenza Virus 4 NOT DETECTED NOT DETECTED Final   Respiratory Syncytial Virus NOT DETECTED NOT DETECTED Final   Bordetella pertussis NOT DETECTED NOT DETECTED Final   Bordetella Parapertussis NOT DETECTED NOT DETECTED Final   Chlamydophila pneumoniae NOT DETECTED NOT DETECTED Final   Mycoplasma pneumoniae NOT DETECTED NOT DETECTED Final    Comment: Performed at [facility], [address], [place], [place] [address]  Expectorated Sputum Assessment w Gram Stain, Rflx to Resp Cult     Status: None   Collection Time: 07/25/22  8:00 AM   Specimen: Expectorated Sputum  Result Value Ref Range Status   Specimen Description EXPECTORATED SPUTUM  Final   Special Requests  NONE  Final   Sputum evaluation   Final    Sputum specimen not acceptable for testing.  Please recollect.   Gram Stain Report Called to,Read Back By and Verified With: RN Blair Dolphin [phone] @ [phone] FH Performed at [facility], [address] [place], [place] [address]    Report Status 07/25/2022 FINAL  Final   *Note: Due to a large number of results and/or encounters for the requested time period, some results have not been displayed. A complete set of results can be found in Results Review.    Labs: CBC: Recent Labs  Lab 10/20/22 1700 10/21/22 0730 10/21/22 1411 10/22/22 0333 10/22/22 1111 10/23/22 0254 10/23/22 1039  WBC 5.1 4.3  --   --   --   --   --   HGB 5.8* 8.0* 7.3* 7.5* 8.2* 7.1* 7.8*  HCT 15.5* 20.8* 19.9* 20.3* 22.0* 19.6* 21.8*  MCV 107.6* 98.6  --   --   --   --   --   PLT 84* 85*  --   --   --   --   --    Basic Metabolic Panel: Recent Labs  Lab 10/20/22 1709 10/21/22 0730 10/22/22 0333 10/23/22 0254  NA 135 135 135 134*  K 4.0 4.2 4.0 4.1  CL 94* 90* 94* 95*  CO2 26 25 25 24   GLUCOSE 98 95 103* 102*  BUN 40* 45* 53* 66*  CREATININE 5.93* 6.87* 8.28* 9.56*  CALCIUM 7.8* 7.7* 7.8* 8.2*  MG  --  2.1  --   --   PHOS  --  4.7*  --   --    Liver Function Tests: Recent Labs  Lab 10/20/22 1709 10/21/22 0730 10/22/22 0333 10/23/22 0254  AST 65* 50* 32 23  ALT 58* 49* 37 28  ALKPHOS 187* 174* 161* 141*  BILITOT 1.6* 1.6* 1.7* 1.2  PROT 6.7 6.9 6.6 6.1*  ALBUMIN 3.0* 3.2* 3.3* 2.9*   CBG: No results for input(s): "GLUCAP" in the last 168 hours.  Discharge time spent: approximately 35 minutes spent on discharge counseling, evaluation of patient on day of discharge, and coordination of discharge planning with nursing, social work, pharmacy and case management  Signed: Alberteen Sam, MD [facility] 10/23/2022

## 2022-10-23 NOTE — Progress Notes (Signed)
Contacted by attending regarding pt's normal TTS schedule for out-pt HD and pt due today. Attending discussed case with nephrologist and it was felt pt appropriate for out-pt HD tomorrow. Spoke to [place], Consulting civil engineer, at [employer]. Clinic can treat pt tomorrow. Pt needs to arrive at 6:00 am for 6:20 am chair time. Pt uses [facility] for transportation to/from HD. Navigator contacted [employer] and spoke to [place]. Transportation appt has been made for tomorrow to transport pt to out-pt HD. Pt's pick-up time from home will be 5:00 am-5:30 am. Pt's pick-up time from clinic will be 10:45 am-11:15 am. Spoke to pt via phone. Pt agreeable to appt time and transportation arrangements for tomorrow. Will add arrangements to AV[facility] as well. Pt requested that his transportation to work this week be canceled with [facility]. [place] with [facility] canceled pt's transportation to work for this week per pt's request. Spoke to [place] at [facility] to confirm that pt will be at appt tomorrow and transportation has been arranged. Update provided to attending and nephrologist as well.   Olivia Canter Renal Navigator [phone]

## 2022-10-23 NOTE — Progress Notes (Signed)
Discharge instructions (including medications) discussed with and copy provided to patient/caregiver  Patient has his medication from [facility] Called Patients guardian to notify of discharge

## 2022-10-23 NOTE — Anesthesia Postprocedure Evaluation (Signed)
Anesthesia Post Note  Patient: Patrick [name]  Procedure(s) Performed: ESOPHAGOGASTRODUODENOSCOPY (EGD) WITH PROPOFOL BIOPSY     Patient location during evaluation: Endoscopy Anesthesia Type: MAC Level of consciousness: awake and alert Pain management: pain level controlled Vital Signs Assessment: post-procedure vital signs reviewed and stable Respiratory status: spontaneous breathing, nonlabored ventilation, respiratory function stable and patient connected to nasal cannula oxygen Cardiovascular status: stable and blood pressure returned to baseline Postop Assessment: no apparent nausea or vomiting Anesthetic complications: no   No notable events documented.  Last Vitals:  Vitals:   10/22/22 2239 10/23/22 0503  BP: 101/63 (!) 85/53  Pulse:  (!) 55  Resp:  17  Temp:  (!) 36.4 C  SpO2:  97%    Last Pain:  Vitals:   10/23/22 0503  TempSrc: Oral  PainSc:                  Uma Jerde S

## 2022-10-23 NOTE — TOC Initial Note (Signed)
Transition of Care [facility]) - Initial/Assessment Note    Patient Details  Name: Patrick [name] MRN: [id number] Date of Birth: [birth year]  Transition of Care [facility]) CM/SW Contact:    Gala Lewandowsky, RN Phone Number: 10/23/2022, 12:46 PM  Clinical Narrative:  Risk for readmission assessment completed. PTA patient was from home alone. Patient has insurance and PCP. Patient ESRD- HD TTS and he has transportation to appointments. Patient states he has no issues getting medications. Patient reports that his family checks in on him. No home needs identified at this time.                 Expected Discharge Plan: Home/Self Care Barriers to Discharge: No Barriers Identified   Patient Goals and CMS Choice Patient states their goals for this hospitalization and ongoing recovery are:: to return home.   Choice offered to / list presented to : NA      Expected Discharge Plan and Services In-house Referral: NA Discharge Planning Services: CM Consult Post Acute Care Choice: NA Living arrangements for the past 2 months: Apartment Expected Discharge Date: 10/23/22                 DME Agency: NA       HH Arranged: NA   Prior Living Arrangements/Services Living arrangements for the past 2 months: Apartment Lives with:: Self Patient language and need for interpreter reviewed:: Yes Do you feel safe going back to the place where you live?: Yes      Need for Family Participation in Patient Care: No (Comment) Care giver support system in place?: No (comment)   Criminal Activity/Legal Involvement Pertinent to Current Situation/Hospitalization: No - Comment as needed  Activities of Daily Living Home Assistive Devices/Equipment: Cane (specify quad or straight) ADL Screening (condition at time of admission) Patient's cognitive ability adequate to safely complete daily activities?: Yes Is the patient deaf or have difficulty hearing?: No Does the patient have difficulty seeing, even  when wearing glasses/contacts?: Yes Does the patient have difficulty concentrating, remembering, or making decisions?: No Patient able to express need for assistance with ADLs?: Yes Does the patient have difficulty dressing or bathing?: Yes Independently performs ADLs?: Yes (appropriate for developmental age) Does the patient have difficulty walking or climbing stairs?: Yes Weakness of Legs: Left Weakness of Arms/Hands: None  Permission Sought/Granted Permission sought to share information with : Case Manager, Family Supports    Emotional Assessment Appearance:: Appears stated age Attitude/Demeanor/Rapport: Engaged Affect (typically observed): Appropriate Orientation: : Oriented to Self, Oriented to Place, Oriented to  Time Alcohol / Substance Use: Not Applicable Psych Involvement: No (comment)  Admission diagnosis:  Symptomatic anemia [D64.9] Anemia, unspecified type [D64.9] Dyspnea, unspecified type [R06.00] Patient Active Problem List   Diagnosis Date Noted   Cirrhosis (HCC) 10/21/2022   Chronic hypotension 10/21/2022   Thrombocytopenia (HCC) 10/21/2022   Wound infection 10/02/2022   Legally blind 10/02/2022   Atrial fibrillation with RVR (HCC) 07/23/2022   Hypoglycemia 07/23/2022   Cystic lesion of abdominal viscera 04/06/2022   Bile leak    Cholelithiasis 11/30/2021   Chronic pain syndrome    Persistent atrial fibrillation (HCC) 09/13/2021   Symptomatic anemia 09/13/2021   Abdominal fluid collection 08/22/2021   Gastric AVM    Hiatal hernia    History of GI bleed 06/27/2021   Visual impairment 06/27/2021   COPD (chronic obstructive pulmonary disease) (HCC) 06/27/2021   Acute gangrenous cholecystitis s/p open cholcystectomy 05/24/2021 05/27/2021   Ischemia of small intestine with SBO  s/p SB resection 05/24/2021 05/27/2021   Protein-calorie malnutrition, moderate (HCC) 05/23/2021   Prolonged QT interval 08/02/2020   Chronic anticoagulation    Tobacco dependence  05/28/2020   Chronic systolic CHF (congestive heart failure) (HCC) 09/11/2019   Encounter for therapeutic drug monitoring 12/29/2015   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   ESRD on HD TTS 05/06/2014   Vitamin D deficiency 09/25/2013   CKD (chronic kidney disease), stage IV (HCC) 09/25/2013   Onychomycosis of toenail 08/27/2013   Sickle cell disease (HCC) 11/21/2012   NSVT (nonsustained ventricular tachycardia) (HCC) 10/21/2011   Gout 10/19/2011   PCP:  Quentin Angst, MD Pharmacy:   [id number] [place], [place] - [address] [place] [address] Phone: [phone] Fax: [phone]  [facility] [address] [place] [address] Phone: [phone] Fax: [phone]  Social Determinants of Health (SDOH) Social History: SDOH Screenings   Food Insecurity: No Food Insecurity (10/20/2022)  Housing: Low Risk  (10/20/2022)  Transportation Needs: No Transportation Needs (10/20/2022)  Utilities: Not At Risk (10/20/2022)  Alcohol Screen: Low Risk  (09/27/2022)  Depression (PHQ2-9): High Risk (10/02/2022)  Financial Resource Strain: Low Risk  (09/27/2022)  Physical Activity: Inactive (09/27/2022)  Social Connections: Socially Isolated (09/27/2022)  Stress: No Stress Concern Present (09/27/2022)  Tobacco Use: Medium Risk (10/22/2022)    Readmission Risk Interventions    10/23/2022   12:41 PM 07/24/2022    1:28 PM 08/05/2020   11:54 AM  Readmission Risk Prevention Plan  Transportation Screening Complete Complete Complete  PCP or Specialist Appt within 3-5 Days   Complete  HRI or Home Care Consult   Complete  Social Work Consult for Recovery Care Planning/Counseling   Complete  Palliative Care Screening   Not Applicable  Medication Review [facility]) Complete Complete Complete  PCP or Specialist appointment within 3-5 days of discharge Complete Complete   HRI or Home Care Consult Complete Complete   SW Recovery Care/Counseling  Consult Complete    Palliative Care Screening Not Applicable Not Applicable   Skilled Nursing Facility Not Applicable Not Applicable

## 2022-10-23 NOTE — Plan of Care (Signed)

## 2022-10-24 LAB — AFP TUMOR MARKER: AFP, Serum, Tumor Marker: 3 ng/mL (ref 0.0–8.4)

## 2022-10-25 ENCOUNTER — Encounter (HOSPITAL_BASED_OUTPATIENT_CLINIC_OR_DEPARTMENT_OTHER): Payer: PPO | Admitting: Internal Medicine

## 2022-10-25 ENCOUNTER — Encounter (HOSPITAL_COMMUNITY): Payer: Self-pay | Admitting: Internal Medicine

## 2022-10-25 ENCOUNTER — Telehealth: Payer: Self-pay | Admitting: *Deleted

## 2022-10-25 DIAGNOSIS — L97822 Non-pressure chronic ulcer of other part of left lower leg with fat layer exposed: Secondary | ICD-10-CM | POA: Diagnosis not present

## 2022-10-25 DIAGNOSIS — I87312 Chronic venous hypertension (idiopathic) with ulcer of left lower extremity: Secondary | ICD-10-CM | POA: Diagnosis not present

## 2022-10-25 DIAGNOSIS — D571 Sickle-cell disease without crisis: Secondary | ICD-10-CM | POA: Diagnosis not present

## 2022-10-25 DIAGNOSIS — Z992 Dependence on renal dialysis: Secondary | ICD-10-CM | POA: Diagnosis not present

## 2022-10-25 DIAGNOSIS — H548 Legal blindness, as defined in USA: Secondary | ICD-10-CM | POA: Diagnosis not present

## 2022-10-25 DIAGNOSIS — N186 End stage renal disease: Secondary | ICD-10-CM | POA: Diagnosis not present

## 2022-10-25 DIAGNOSIS — I5022 Chronic systolic (congestive) heart failure: Secondary | ICD-10-CM | POA: Diagnosis not present

## 2022-10-25 DIAGNOSIS — Z87891 Personal history of nicotine dependence: Secondary | ICD-10-CM | POA: Diagnosis not present

## 2022-10-25 DIAGNOSIS — J449 Chronic obstructive pulmonary disease, unspecified: Secondary | ICD-10-CM | POA: Diagnosis not present

## 2022-10-25 NOTE — Transitions of Care (Post Inpatient/ED Visit) (Signed)
   10/25/2022  Name: ANAHI CHORLEY MRN: [id number] DOB: 01-26-1956  Today's TOC FU Call Status: Today's TOC FU Call Status:: Unsuccessul Call (1st Attempt) Unsuccessful Call (1st Attempt) Date: 10/25/22  Attempted to reach the patient regarding the most recent Inpatient visit; left HIPAA compliant voice message requesting call back  Follow Up Plan: Additional outreach attempts will be made to reach the patient to complete the Transitions of Care (Post Inpatient visit) call.   Caryl Pina, RN, BSN, CCRN Alumnus RN CM Care Coordination/ Transition of Care- [facility] Care Management [phone]: direct office

## 2022-10-26 ENCOUNTER — Telehealth: Payer: Self-pay | Admitting: *Deleted

## 2022-10-26 ENCOUNTER — Encounter: Payer: Self-pay | Admitting: *Deleted

## 2022-10-26 NOTE — Transitions of Care (Post Inpatient/ED Visit) (Signed)
10/26/2022  Name: Patrick [name] MRN: [id number] DOB: Dec 20, 1955  Today's TOC FU Call Status: Today's TOC FU Call Status:: Successful TOC FU Call Competed TOC FU Call Complete Date: 10/26/22  Transition Care Management Follow-up Telephone Call Date of Discharge: 10/23/22 Discharge Facility: [facility]) Type of Discharge: Inpatient Admission Primary Inpatient Discharge Diagnosis:: anemia requiring blood transfusion; ESRD/ sickle cell disease; endoscopy How have you been since you were released from the hospital?: Better ("I am doing okay so far.  I am going to talk to my doctor on Tuesday about having my medications all under one doctor, so I can make sure I get my refills like I am supposed to") Any questions or concerns?: No  Items Reviewed: Did you receive and understand the discharge instructions provided?: Yes (thoroughly reviewed with patient who verbalizes good understanding of same) Medications obtained,verified, and reconciled?: Yes (Medications Reviewed) (Full medication reconciliation/ review completed; no concerns or discrepancies identified; confirmed patient obtained/ is taking all newly Rx'd medications as instructed; self-manages medications and denies questions/ concerns around medications today) Any new allergies since your discharge?: No Dietary orders reviewed?: Yes Type of Diet Ordered:: Regular Do you have support at home?: Yes People in Home: alone Name of Support/Comfort Primary Source: Lives alone; Reports independent in self-care activities; local cousin assists as/ if needed/ indicated  Medications Reviewed Today: Medications Reviewed Today     Reviewed by Michaela Corner, RN (Registered Nurse) on 10/26/22 at [phone]  Med List Status: <None>   Medication Order Taking? Sig Documenting Provider Last Dose Status Informant  acetaminophen (TYLENOL) 500 MG tablet [id number] Yes Take 1,500 mg by mouth 3 (three) times daily as needed for mild pain, moderate  pain, fever or headache. [provider] Taking Active Self, Pharmacy Records           Med Note Sherilyn Cooter, [facility]   Wed Aug 01, 2022  1:25 PM) Prn   allopurinol (ZYLOPRIM) 100 MG tablet [id number] Yes TAKE 1 TABLET BY MOUTH EVERY DAY Paseda, Baird Kay, FNP Taking Active Self, Pharmacy Records  AURYXIA 1 GM 210 MG(Fe) tablet [id number] Yes Take 210-420 mg by mouth See admin instructions. 420 mg with every meal, 210 mg with each snack. [provider] Taking Active Self, Pharmacy Records           Med Note Lenor Derrick   Wed Nov 22, 2021  4:12 PM)    cinacalcet (SENSIPAR) 30 MG tablet [id number] Yes Take 1 tablet (30 mg total) by mouth Every Tuesday,Thursday,and Saturday with dialysis. Kendell Bane, MD Taking Active Self, Pharmacy Records  diphenhydramine-acetaminophen (TYLENOL PM) 25-500 MG TABS tablet [id number] Yes Take 2 tablets by mouth at bedtime as needed (sleep/pain). Ivonne Andrew, NP Taking Active Self, Pharmacy Records  folic acid (FOLVITE) 1 MG tablet [id number] Yes Take 1 tablet (1 mg total) by mouth daily. Massie Maroon, FNP Taking Active Self, Pharmacy Records  hydroxyurea (HYDREA) 500 MG capsule [id number] Yes Take 1 capsule (500 mg total) by mouth daily. Quentin Angst, MD Taking Active Self, Pharmacy Records  Iron-Vitamins (GERITOL PO) [id number] Yes Take 15 mLs by mouth daily. [provider] Taking Active Self, Pharmacy Records  lactulose (CHRONULAC) 10 GM/15ML solution [id number] Yes Take 30 mLs (20 g total) by mouth 2 (two) times daily as needed for mild constipation. Alberteen Sam, MD Taking Active   lidocaine-prilocaine (EMLA) cream [id number] Yes Apply 1 application. topically Every Tuesday,Thursday,and Saturday with dialysis. [provider] Taking Active Self, Pharmacy  Records           Med Note Darryl Nestle   Mon Jul 31, 2021  7:55 PM)    Oxycodone HCl 10 MG TABS [id number] Yes Take 1 tablet (10 mg total) by  mouth every 6 (six) hours as needed. Massie Maroon, FNP Taking Active Self, Pharmacy Records  pantoprazole (PROTONIX) 40 MG tablet [id number] Yes Take 1 tablet (40 mg total) by mouth 2 (two) times daily before a meal. Danford, Earl Lites, MD Taking Active   tiZANidine (ZANAFLEX) 4 MG tablet [id number] Yes TAKE 1/2 TABLET (2MG  TOTAL) BY MOUTH EVERY 8 HOURS AS NEEDED FOR MUSCLE SPASM  Patient taking differently: Take 2 mg by mouth every 6 (six) hours as needed for muscle spasms.   Massie Maroon, FNP Taking Active Self, Pharmacy Records  Torsemide 40 MG TABS [id number] Yes Take 40 mg by mouth in the morning and at bedtime. Ivonne Andrew, NP Taking Active Self, Pharmacy Records  Vitamin D, Ergocalciferol, (DRISDOL) 1.25 MG (50000 UNIT) CAPS capsule [id number] No Take 1 capsule (50,000 Units total) by mouth once a week. Monday  Patient not taking: Reported on 08/01/2022   Glade Lloyd, MD Not Taking Active Self, Pharmacy Records           Med Note [facility], Elvin So   Tue Jul 24, 2022  1:20 AM)              Home Care and Equipment/Supplies: Were Home Health Services Ordered?: No Any new equipment or medical supplies ordered?: No  Functional Questionnaire: Do you need assistance with bathing/showering or dressing?: No Do you need assistance with meal preparation?: No Do you need assistance with eating?: No Do you have difficulty maintaining continence: No Do you need assistance with getting out of bed/getting out of a chair/moving?: No Do you have difficulty managing or taking your medications?: Yes (cousin manages all aspectsd of medication administration/ preparation)  Follow up appointments reviewed: PCP Follow-up appointment confirmed?: Yes Date of PCP follow-up appointment?: 10/30/22 Follow-up Provider: PCP Specialist Hospital Follow-up appointment confirmed?: Yes Date of Specialist follow-up appointment?: 10/25/22 Follow-Up Specialty Provider:: verified attended podaitry  appointment as scheduled; verified has attended HD sessions as scheduled post-hospital discharge Do you need transportation to your follow-up appointment?: No Do you understand care options if your condition(s) worsen?: Yes-patient verbalized understanding  SDOH Interventions Today    Flowsheet Row Most Recent Value  SDOH Interventions   Food Insecurity Interventions Intervention Not Indicated  Transportation Interventions Intervention Not Indicated  [patient uses [facility] services- reports he has been established with [facility] for "a long time"]      TOC Interventions Today    Flowsheet Row Most Recent Value  TOC Interventions   TOC Interventions Discussed/Reviewed TOC Interventions Discussed      Interventions Today    Flowsheet Row Most Recent Value  Chronic Disease   Chronic disease during today's visit Chronic Kidney Disease/End Stage Renal Disease (ESRD)  General Interventions   General Interventions Discussed/Reviewed General Interventions Discussed, Doctor Visits, Durable Medical Equipment (DME), Communication with  [confirmed already established with RN CM Care Coordinator with outreach scheduled 11/01/22 after PCP HFU OV on 10/30/22]  Doctor Visits Discussed/Reviewed Doctor Visits Discussed, PCP, Doctor Visits Reviewed  [reviewed podiatry visit on 10/25/22- stated doctor told him wound is "getting smaller"]  Durable Medical Equipment (DME) Other  [confirmed does not currently require assistive devices]  PCP/Specialist Visits Compliance with follow-up visit  Communication with RN  Education Interventions  Education Provided Provided Education  Provided Verbal Education On Medication  [need to take full list of medications with him to upcoming PCP appointment 10/30/22- for review and getting all Rx's under one MD for ease of refilling]  Nutrition Interventions   Nutrition Discussed/Reviewed Nutrition Discussed  Pharmacy Interventions   Pharmacy Dicussed/Reviewed Pharmacy Topics  Discussed  [Full medication review with updating medication list in EHR per patient report]  Safety Interventions   Safety Discussed/Reviewed Safety Discussed      Caryl Pina, RN, BSN, CCRN Alumnus RN CM Care Coordination/ Transition of Care- [facility] Care Management [phone]: direct office

## 2022-10-30 ENCOUNTER — Ambulatory Visit (INDEPENDENT_AMBULATORY_CARE_PROVIDER_SITE_OTHER): Payer: PPO | Admitting: Family Medicine

## 2022-10-30 VITALS — BP 99/50 | HR 80 | Temp 97.1°F | Wt 139.0 lb

## 2022-10-30 DIAGNOSIS — Z09 Encounter for follow-up examination after completed treatment for conditions other than malignant neoplasm: Secondary | ICD-10-CM

## 2022-10-30 DIAGNOSIS — I10 Essential (primary) hypertension: Secondary | ICD-10-CM | POA: Diagnosis not present

## 2022-10-30 DIAGNOSIS — N186 End stage renal disease: Secondary | ICD-10-CM | POA: Diagnosis not present

## 2022-10-30 DIAGNOSIS — M25561 Pain in right knee: Secondary | ICD-10-CM

## 2022-10-30 DIAGNOSIS — D5700 Hb-SS disease with crisis, unspecified: Secondary | ICD-10-CM | POA: Diagnosis not present

## 2022-10-30 DIAGNOSIS — R1084 Generalized abdominal pain: Secondary | ICD-10-CM | POA: Diagnosis not present

## 2022-10-30 DIAGNOSIS — L299 Pruritus, unspecified: Secondary | ICD-10-CM | POA: Diagnosis not present

## 2022-10-30 DIAGNOSIS — F17200 Nicotine dependence, unspecified, uncomplicated: Secondary | ICD-10-CM

## 2022-10-30 DIAGNOSIS — D571 Sickle-cell disease without crisis: Secondary | ICD-10-CM | POA: Diagnosis not present

## 2022-10-30 DIAGNOSIS — G894 Chronic pain syndrome: Secondary | ICD-10-CM

## 2022-10-30 DIAGNOSIS — Z992 Dependence on renal dialysis: Secondary | ICD-10-CM | POA: Diagnosis not present

## 2022-10-30 DIAGNOSIS — N2581 Secondary hyperparathyroidism of renal origin: Secondary | ICD-10-CM | POA: Diagnosis not present

## 2022-10-30 DIAGNOSIS — D57219 Sickle-cell/Hb-C disease with crisis, unspecified: Secondary | ICD-10-CM | POA: Diagnosis not present

## 2022-10-30 DIAGNOSIS — D57819 Other sickle-cell disorders with crisis, unspecified: Secondary | ICD-10-CM | POA: Diagnosis not present

## 2022-10-30 DIAGNOSIS — D509 Iron deficiency anemia, unspecified: Secondary | ICD-10-CM | POA: Diagnosis not present

## 2022-10-30 DIAGNOSIS — G8929 Other chronic pain: Secondary | ICD-10-CM

## 2022-10-30 DIAGNOSIS — D631 Anemia in chronic kidney disease: Secondary | ICD-10-CM | POA: Diagnosis not present

## 2022-10-30 MED ORDER — PANTOPRAZOLE SODIUM 40 MG PO TBEC: 40.0000 mg | DELAYED_RELEASE_TABLET | Freq: Two times a day (BID) | ORAL | 1 refills | Status: DC

## 2022-11-01 ENCOUNTER — Telehealth: Payer: Self-pay

## 2022-11-01 ENCOUNTER — Telehealth: Payer: Self-pay | Admitting: Internal Medicine

## 2022-11-01 ENCOUNTER — Other Ambulatory Visit: Payer: Self-pay | Admitting: Family Medicine

## 2022-11-01 ENCOUNTER — Encounter (HOSPITAL_BASED_OUTPATIENT_CLINIC_OR_DEPARTMENT_OTHER): Payer: PPO | Admitting: Internal Medicine

## 2022-11-01 DIAGNOSIS — L97822 Non-pressure chronic ulcer of other part of left lower leg with fat layer exposed: Secondary | ICD-10-CM | POA: Diagnosis not present

## 2022-11-01 DIAGNOSIS — D571 Sickle-cell disease without crisis: Secondary | ICD-10-CM

## 2022-11-01 DIAGNOSIS — I87312 Chronic venous hypertension (idiopathic) with ulcer of left lower extremity: Secondary | ICD-10-CM

## 2022-11-01 MED ORDER — OXYCODONE HCL 10 MG PO TABS
10.0000 mg | ORAL_TABLET | Freq: Four times a day (QID) | ORAL | 0 refills | Status: DC | PRN
Start: 2022-11-01 — End: 2022-11-14

## 2022-11-01 NOTE — Progress Notes (Signed)
Reviewed PDMP substance reporting system prior to prescribing opiate medications. No inconsistencies noted.  Meds ordered this encounter  Medications   Oxycodone HCl 10 MG TABS    Sig: Take 1 tablet (10 mg total) by mouth every 6 (six) hours as needed.    Dispense:  60 tablet    Refill:  0    Order Specific Question:   Supervising Provider    Answer:   JEGEDE, OLUGBEMIGA E [[id number]]   Patrick Hall Moore Valree Feild  APRN, MSN, FNP-C [facility] [address]  [place], [place] [address] [phone]  

## 2022-11-01 NOTE — Patient Outreach (Signed)
  Care Coordination   11/01/2022 Name: Patrick [name] MRN: [id number] DOB: 07-28-1955   Care Coordination Outreach Attempts:  An unsuccessful telephone outreach was attempted for a scheduled appointment today.  Follow Up Plan:  Additional outreach attempts will be made to offer the patient care coordination information and services.   Encounter Outcome:  No Answer   Care Coordination Interventions:  No, not indicated    Kathyrn Sheriff, RN, MSN, BSN, CCM [facility] Care Coordinator [phone]

## 2022-11-01 NOTE — Telephone Encounter (Signed)
Caller & Relationship to patient:  MRN #  [id number]   Call Back Number:   Date of Last Office Visit: 11/01/2022     Date of Next Office Visit: Visit date not found    Medication(s) to be Refilled: Oxycodone   Preferred Pharmacy:   ** Please notify patient to allow 48-72 hours to process** **Let patient know to contact pharmacy at the end of the day to make sure medication is ready. ** **If patient has not been seen in a year or longer, book an appointment **Advise to use MyChart for refill requests OR to contact their pharmacy

## 2022-11-02 ENCOUNTER — Telehealth: Payer: Self-pay | Admitting: Family Medicine

## 2022-11-02 ENCOUNTER — Ambulatory Visit (HOSPITAL_COMMUNITY)
Admission: RE | Admit: 2022-11-02 | Discharge: 2022-11-02 | Disposition: A | Discharge status: Home / Self Care | Payer: PPO | Source: Ambulatory Visit | Attending: Family Medicine | Admitting: Family Medicine

## 2022-11-02 DIAGNOSIS — M25561 Pain in right knee: Secondary | ICD-10-CM | POA: Diagnosis not present

## 2022-11-02 DIAGNOSIS — G8929 Other chronic pain: Secondary | ICD-10-CM | POA: Diagnosis not present

## 2022-11-02 LAB — CBC
Hematocrit: 22.1 % — ABNORMAL LOW (ref 37.5–51.0)
Hemoglobin: 7.9 g/dL — ABNORMAL LOW (ref 13.0–17.7)
MCH: 37.3 pg — ABNORMAL HIGH (ref 26.6–33.0)
MCHC: 35.7 g/dL (ref 31.5–35.7)
MCV: 104 fL — ABNORMAL HIGH (ref 79–97)
Platelets: 92 10*3/uL — CL (ref 150–450)
RBC: 2.12 x10E6/uL — CL (ref 4.14–5.80)
RDW: 21.5 % — ABNORMAL HIGH (ref 11.6–15.4)
WBC: 5.1 10*3/uL (ref 3.4–10.8)

## 2022-11-02 LAB — RETICULOCYTES: Retic Ct Pct: 2.8 % — ABNORMAL HIGH (ref 0.6–2.6)

## 2022-11-02 MED ORDER — OXYCODONE HCL ER 10 MG PO T12A
10.0000 mg | EXTENDED_RELEASE_TABLET | Freq: Two times a day (BID) | ORAL | 0 refills | Status: DC
Start: 2022-11-02 — End: 2023-01-21

## 2022-11-02 NOTE — Progress Notes (Signed)
Established Patient Office Visit  Subjective   Patient ID: Patrick [name], male    DOB: [birth year]  Age: 67 y.o. MRN: [id number]  Chief Complaint  Patient presents with   Sickle Cell Anemia    Follow up    Patrick [name] is a very pleasant 67 year old male with a medical history significant for end-stage renal disease on dialysis, sickle cell disease with chronic pain, opiate dependence and tolerance, anemia of chronic disease, atrial fibrillation, liver cirrhosis, and COPD that presents for follow-up of his chronic conditions. Also, patient was recently hospitalized after presenting to the emergency department with increasing shortness of breath and abdominal pain.  Patient was found to have symptomatic anemia with a hemoglobin of 5.8 g/dL.  Also EGD showed gastritis, no other acute findings.  During hospitalization, gastric biopsy was obtained.  The patient to call Dr. Rhea Belton, gastroenterologist for results within 1 week of discharge according to patient's discharge summary. Patient continues to have abdominal bloating and abdominal pain.  He states that his abdomen feels "swollen".  Patient states that he has not had much of an appetite and has increased fatigue. During admission, patient received 1 unit PRBCs for symptomatic anemia.  Prior to discharge, patient's hemoglobin improved to 7.8 g/dL, which is consistent with his baseline. Patient also has a history of liver cirrhosis, compensated.  He is followed by cardiology for persistent atrial fibrillation and congestive heart failure. Patrick [name] has end-stage renal disease.  He undergoes dialysis on Tuesday, Thursday, and Saturday.  Of note, patient ambulating with cane assistance.  He appears disheveled.  Patient legally blind.  Patient's cousin serves as his home support and drives him to appointments.  Also, patient's cousin helps with medication management and picking up his prescriptions.    Patient Active Problem List    Diagnosis Date Noted   Cirrhosis (HCC) 10/21/2022   Chronic hypotension 10/21/2022   Thrombocytopenia (HCC) 10/21/2022   Wound infection 10/02/2022   Legally blind 10/02/2022   Atrial fibrillation with RVR (HCC) 07/23/2022   Hypoglycemia 07/23/2022   Cystic lesion of abdominal viscera 04/06/2022   Bile leak    Cholelithiasis 11/30/2021   Chronic pain syndrome    Persistent atrial fibrillation (HCC) 09/13/2021   Symptomatic anemia 09/13/2021   Abdominal fluid collection 08/22/2021   Gastric AVM    Hiatal hernia    History of GI bleed 06/27/2021   Visual impairment 06/27/2021   COPD (chronic obstructive pulmonary disease) (HCC) 06/27/2021   Acute gangrenous cholecystitis s/p open cholcystectomy 05/24/2021 05/27/2021   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 05/24/2021 05/27/2021   Protein-calorie malnutrition, moderate (HCC) 05/23/2021   Prolonged QT interval 08/02/2020   Chronic anticoagulation    Tobacco dependence 05/28/2020   Chronic systolic CHF (congestive heart failure) (HCC) 09/11/2019   Encounter for therapeutic drug monitoring 12/29/2015   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   ESRD on HD TTS 05/06/2014   Vitamin D deficiency 09/25/2013   CKD (chronic kidney disease), stage IV (HCC) 09/25/2013   Onychomycosis of toenail 08/27/2013   Sickle cell disease (HCC) 11/21/2012   NSVT (nonsustained ventricular tachycardia) (HCC) 10/21/2011   Gout 10/19/2011   Past Medical History:  Diagnosis Date   A-fib (HCC)    Blood dyscrasia    Blood transfusion    "I've had a bunch of them"   CHF (congestive heart failure) (HCC)    followed by hf clinic   Chronic kidney disease    Stage 5, dialysis on Tu/Th/Sa  Dialysis patient [facility]) 04/2019   AV fistula (right arm), TTS   Dysrhythmia    A-Fib per patient   Early satiety    Elevated LFTs 04/01/2022   Elevated troponin 04/01/2022   Empyema of gallbladder 05/27/2021   GERD (gastroesophageal reflux disease)    Gout     Legally blind    Metabolic acidosis with increased anion gap and accumulation of organic acids 04/01/2022   Mitral regurgitation    Muscle tightness-Right arm  05/27/2014   Pneumonia    Pulmonary hypertension (HCC)    Shortness of breath dyspnea    Sickle cell disease, type SS (HCC)    Swelling abdomen    Swelling of extremity, left    Swelling of extremity, right    Tricuspid regurgitation    Past Surgical History:  Procedure Laterality Date   A/V FISTULAGRAM Right 07/29/2020   Procedure: A/V FISTULAGRAM - Right Arm;  Surgeon: Chuck Hint, MD;  Location: [facility] INVASIVE CV LAB;  Service: Cardiovascular;  Laterality: Right;   BASCILIC VEIN TRANSPOSITION Right 04/12/2014   Procedure: BASILIC VEIN TRANSPOSITION;  Surgeon: Sherren Kerns, MD;  Location: [facility] OR;  Service: Vascular;  Laterality: Right;   BILIARY STENT PLACEMENT  04/04/2022   Procedure: BILIARY STENT PLACEMENT;  Surgeon: Meryl Dare, MD;  Location: [facility] ENDOSCOPY;  Service: Gastroenterology;;   BIOPSY  08/04/2020   Procedure: BIOPSY;  Surgeon: Meryl Dare, MD;  Location: [facility] ENDOSCOPY;  Service: Gastroenterology;;   BIOPSY  06/28/2021   Procedure: BIOPSY;  Surgeon: Beverley Fiedler, MD;  Location: [facility] ENDOSCOPY;  Service: Gastroenterology;;   BIOPSY  04/02/2022   Procedure: BIOPSY;  Surgeon: Lemar Lofty., MD;  Location: [facility] ENDOSCOPY;  Service: Gastroenterology;;   BIOPSY  10/22/2022   Procedure: BIOPSY;  Surgeon: Beverley Fiedler, MD;  Location: [facility] ENDOSCOPY;  Service: Gastroenterology;;   BOWEL RESECTION N/A 05/24/2021   Procedure: SMALL BOWEL RESECTION;  Surgeon: Emelia Loron, MD;  Location: [facility] OR;  Service: General;  Laterality: N/A;   CARDIOVERSION N/A 06/05/2018   Procedure: CARDIOVERSION;  Surgeon: Dolores Patty, MD;  Location: [facility] ENDOSCOPY;  Service: Cardiovascular;  Laterality: N/A;   CARDIOVERSION N/A 10/06/2019   Procedure: CARDIOVERSION;  Surgeon: Dolores Patty, MD;  Location:  [facility] ENDOSCOPY;  Service: Cardiovascular;  Laterality: N/A;   CHOLECYSTECTOMY N/A 05/24/2021   Procedure: OPEN CHOLECYSTECTOMY;  Surgeon: Emelia Loron, MD;  Location: [facility] OR;  Service: General;  Laterality: N/A;   COLONOSCOPY WITH PROPOFOL N/A 09/24/2017   Procedure: COLONOSCOPY WITH PROPOFOL;  Surgeon: Lynann Bologna, MD;  Location: [facility] ENDOSCOPY;  Service: Endoscopy;  Laterality: N/A;   ENDOSCOPIC RETROGRADE CHOLANGIOPANCREATOGRAPHY (ERCP) WITH PROPOFOL N/A 05/17/2022   Procedure: ENDOSCOPIC RETROGRADE CHOLANGIOPANCREATOGRAPHY (ERCP) WITH PROPOFOL;  Surgeon: Meridee Score Netty Starring., MD;  Location: [facility] ENDOSCOPY;  Service: Gastroenterology;  Laterality: N/A;   ERCP N/A 04/04/2022   Procedure: ENDOSCOPIC RETROGRADE CHOLANGIOPANCREATOGRAPHY (ERCP);  Surgeon: Meryl Dare, MD;  Location: [facility] ENDOSCOPY;  Service: Gastroenterology;  Laterality: N/A;   ESOPHAGOGASTRODUODENOSCOPY N/A 09/19/2017   Procedure: ESOPHAGOGASTRODUODENOSCOPY (EGD);  Surgeon: Lynann Bologna, MD;  Location: [facility] ENDOSCOPY;  Service: Endoscopy;  Laterality: N/A;   ESOPHAGOGASTRODUODENOSCOPY N/A 05/29/2021   Procedure: ESOPHAGOGASTRODUODENOSCOPY (EGD);  Surgeon: Rachael Fee, MD;  Location: [facility] ENDOSCOPY;  Service: Gastroenterology;  Laterality: N/A;   ESOPHAGOGASTRODUODENOSCOPY (EGD) WITH PROPOFOL N/A 08/04/2020   Procedure: ESOPHAGOGASTRODUODENOSCOPY (EGD) WITH PROPOFOL;  Surgeon: Meryl Dare, MD;  Location: [facility] ENDOSCOPY;  Service: Gastroenterology;  Laterality: N/A;   ESOPHAGOGASTRODUODENOSCOPY (EGD) WITH PROPOFOL  N/A 06/28/2021   Procedure: ESOPHAGOGASTRODUODENOSCOPY (EGD) WITH PROPOFOL;  Surgeon: Beverley Fiedler, MD;  Location: [facility] ENDOSCOPY;  Service: Gastroenterology;  Laterality: N/A;   ESOPHAGOGASTRODUODENOSCOPY (EGD) WITH PROPOFOL N/A 08/10/2021   Procedure: ESOPHAGOGASTRODUODENOSCOPY (EGD) WITH PROPOFOL;  Surgeon: Lynann Bologna, MD;  Location: [facility] ENDOSCOPY;  Service: Gastroenterology;  Laterality: N/A;    ESOPHAGOGASTRODUODENOSCOPY (EGD) WITH PROPOFOL N/A 08/02/2021   Procedure: ESOPHAGOGASTRODUODENOSCOPY (EGD) WITH PROPOFOL;  Surgeon: Shellia Cleverly, DO;  Location: [facility] ENDOSCOPY;  Service: Gastroenterology;  Laterality: N/A;   ESOPHAGOGASTRODUODENOSCOPY (EGD) WITH PROPOFOL N/A 04/02/2022   Procedure: ESOPHAGOGASTRODUODENOSCOPY (EGD) WITH PROPOFOL;  Surgeon: Meridee Score Netty Starring., MD;  Location: [facility] ENDOSCOPY;  Service: Gastroenterology;  Laterality: N/A;   ESOPHAGOGASTRODUODENOSCOPY (EGD) WITH PROPOFOL N/A 05/17/2022   Procedure: ESOPHAGOGASTRODUODENOSCOPY (EGD) WITH PROPOFOL;  Surgeon: Meridee Score Netty Starring., MD;  Location: [facility] ENDOSCOPY;  Service: Gastroenterology;  Laterality: N/A;   ESOPHAGOGASTRODUODENOSCOPY (EGD) WITH PROPOFOL N/A 10/22/2022   Procedure: ESOPHAGOGASTRODUODENOSCOPY (EGD) WITH PROPOFOL;  Surgeon: Beverley Fiedler, MD;  Location: [facility] ENDOSCOPY;  Service: Gastroenterology;  Laterality: N/A;   EUS N/A 05/17/2022   Procedure: UPPER ENDOSCOPIC ULTRASOUND (EUS) RADIAL;  Surgeon: Lemar Lofty., MD;  Location: [facility] ENDOSCOPY;  Service: Gastroenterology;  Laterality: N/A;   FINE NEEDLE ASPIRATION  04/02/2022   Procedure: FINE NEEDLE ASPIRATION (FNA) LINEAR;  Surgeon: Lemar Lofty., MD;  Location: [facility] ENDOSCOPY;  Service: Gastroenterology;;   FINE NEEDLE ASPIRATION N/A 05/17/2022   Procedure: FINE NEEDLE ASPIRATION (FNA) LINEAR;  Surgeon: Lemar Lofty., MD;  Location: [facility] ENDOSCOPY;  Service: Gastroenterology;  Laterality: N/A;   HEMOSTASIS CLIP PLACEMENT  05/29/2021   Procedure: HEMOSTASIS CLIP PLACEMENT;  Surgeon: Rachael Fee, MD;  Location: [facility] ENDOSCOPY;  Service: Gastroenterology;;   HOT HEMOSTASIS N/A 05/29/2021   Procedure: HOT HEMOSTASIS (ARGON PLASMA COAGULATION/BICAP);  Surgeon: Rachael Fee, MD;  Location: [facility] ENDOSCOPY;  Service: Gastroenterology;  Laterality: N/A;   HOT HEMOSTASIS N/A 06/28/2021   Procedure: HOT HEMOSTASIS (ARGON PLASMA  COAGULATION/BICAP);  Surgeon: Beverley Fiedler, MD;  Location: [facility] ENDOSCOPY;  Service: Gastroenterology;  Laterality: N/A;   HOT HEMOSTASIS N/A 08/10/2021   Procedure: HOT HEMOSTASIS (ARGON PLASMA COAGULATION/BICAP);  Surgeon: Lynann Bologna, MD;  Location: [facility] ENDOSCOPY;  Service: Gastroenterology;  Laterality: N/A;   HOT HEMOSTASIS N/A 08/02/2021   Procedure: HOT HEMOSTASIS (ARGON PLASMA COAGULATION/BICAP);  Surgeon: Shellia Cleverly, DO;  Location: [facility] ENDOSCOPY;  Service: Gastroenterology;  Laterality: N/A;   IR PARACENTESIS  07/04/2022   IR PERC TUN PERIT CATH WO PORT S&I /IMAG  05/23/2021   IR REMOVAL TUN CV CATH W/O FL  06/10/2021   IR US GUIDE VASC ACCESS RIGHT  05/23/2021   LAPAROSCOPY N/A 05/24/2021   Procedure: LAPAROSCOPY DIAGNOSTIC;  Surgeon: Emelia Loron, MD;  Location: [facility] OR;  Service: General;  Laterality: N/A;   LAPAROTOMY N/A 05/24/2021   Procedure: EXPLORATORY LAPAROTOMY;  Surgeon: Emelia Loron, MD;  Location: [facility] OR;  Service: General;  Laterality: N/A;   LEFT AND RIGHT HEART CATHETERIZATION WITH CORONARY ANGIOGRAM N/A 06/11/2011   Procedure: LEFT AND RIGHT HEART CATHETERIZATION WITH CORONARY ANGIOGRAM;  Surgeon: Laurey Morale, MD;  Location: [facility] CATH LAB;  Service: Cardiovascular;  Laterality: N/A;   PERIPHERAL VASCULAR BALLOON ANGIOPLASTY Right 07/29/2020   Procedure: PERIPHERAL VASCULAR BALLOON ANGIOPLASTY;  Surgeon: Chuck Hint, MD;  Location: [facility] INVASIVE CV LAB;  Service: Cardiovascular;  Laterality: Right;  arm fistula   REMOVAL OF STONES  05/17/2022   Procedure: REMOVAL OF STONES;  Surgeon: Lemar Lofty., MD;  Location: [facility] ENDOSCOPY;  Service: Gastroenterology;;   REVISON OF ARTERIOVENOUS FISTULA Right 08/08/2020   Procedure: ANEURYSM EXCISION OF RIGHT UPPER EXTREMITY ARTERIOVENOUS FISTULA;  Surgeon: Chuck Hint, MD;  Location: [facility] OR;  Service: Vascular;  Laterality: Right;   REVISON OF ARTERIOVENOUS FISTULA Right 03/21/2022    Procedure: EXCISION OF ULCERATED SKIN OF RIGHT ARTERIOVENOUS FISTULA REVISION;  Surgeon: Cephus Shelling, MD;  Location: [facility] OR;  Service: Vascular;  Laterality: Right;   REVISON OF ARTERIOVENOUS FISTULA Right 07/02/2022   Procedure: EXCISION OF ULCERS RIGHT ARTERIOVENOUS FISTULA;  Surgeon: Chuck Hint, MD;  Location: [facility] OR;  Service: Vascular;  Laterality: Right;   SCLEROTHERAPY  05/29/2021   Procedure: Susa Day;  Surgeon: Rachael Fee, MD;  Location: [facility] ENDOSCOPY;  Service: Gastroenterology;;   Dennison Mascot  04/04/2022   Procedure: SPHINCTEROTOMY;  Surgeon: Meryl Dare, MD;  Location: [facility] ENDOSCOPY;  Service: Gastroenterology;;   Francine Graven REMOVAL  05/29/2021   Procedure: STENT REMOVAL;  Surgeon: Rachael Fee, MD;  Location: [facility] ENDOSCOPY;  Service: Gastroenterology;;   Francine Graven REMOVAL  05/17/2022   Procedure: STENT REMOVAL;  Surgeon: Lemar Lofty., MD;  Location: [facility] ENDOSCOPY;  Service: Gastroenterology;;   TEE WITHOUT CARDIOVERSION N/A 12/27/2015   Procedure: TRANSESOPHAGEAL ECHOCARDIOGRAM (TEE);  Surgeon: Quintella Reichert, MD;  Location: [facility] ENDOSCOPY;  Service: Cardiovascular;  Laterality: N/A;   UPPER ESOPHAGEAL ENDOSCOPIC ULTRASOUND (EUS) N/A 04/02/2022   Procedure: UPPER ESOPHAGEAL ENDOSCOPIC ULTRASOUND (EUS);  Surgeon: Lemar Lofty., MD;  Location: [facility] ENDOSCOPY;  Service: Gastroenterology;  Laterality: N/A;   Social History   Tobacco Use   Smoking status: Former    Packs/day: 0.50    Years: 41.00    Additional pack years: 0.00    Total pack years: 20.50    Types: Cigarettes    Quit date: 12/08/2015    Years since quitting: 6.9   Smokeless tobacco: Never  Vaping Use   Vaping Use: Never used  Substance Use Topics   Alcohol use: Not Currently    Alcohol/week: 3.0 standard drinks of alcohol    Types: 1 Glasses of wine, 2 Cans of beer per week    Comment: none since starting dialysis 06/2021   Drug use: No   Social History    Socioeconomic History   Marital status: Divorced    Spouse name: Not on file   Number of children: Not on file   Years of education: Not on file   Highest education level: Not on file  Occupational History   Occupation: disabled  Tobacco Use   Smoking status: Former    Packs/day: 0.50    Years: 41.00    Additional pack years: 0.00    Total pack years: 20.50    Types: Cigarettes    Quit date: 12/08/2015    Years since quitting: 6.9   Smokeless tobacco: Never  Vaping Use   Vaping Use: Never used  Substance and Sexual Activity   Alcohol use: Not Currently    Alcohol/week: 3.0 standard drinks of alcohol    Types: 1 Glasses of wine, 2 Cans of beer per week    Comment: none since starting dialysis 06/2021   Drug use: No   Sexual activity: Not Currently  Other Topics Concern   Not on file  Social History Narrative   Not on file   Social Determinants of Health   Financial Resource Strain: Low Risk  (09/27/2022)   Overall Financial Resource Strain (CARDIA)    Difficulty of Paying Living Expenses:  Not hard at all  Food Insecurity: No Food Insecurity (10/26/2022)   Hunger Vital Sign    Worried About Running Out of Food in the Last Year: Never true    Ran Out of Food in the Last Year: Never true  Transportation Needs: No Transportation Needs (10/26/2022)   PRAPARE - Administrator, Civil Service (Medical): No    Lack of Transportation (Non-Medical): No  Physical Activity: Inactive (09/27/2022)   Exercise Vital Sign    Days of Exercise per Week: 0 days    Minutes of Exercise per Session: 0 min  Stress: No Stress Concern Present (09/27/2022)   [employer] of Occupational Health - Occupational Stress Questionnaire    Feeling of Stress : Not at all  Social Connections: Socially Isolated (09/27/2022)   Social Connection and Isolation Panel [NHANES]    Frequency of Communication with Friends and Family: More than three times a week    Frequency of Social Gatherings  with Friends and Family: More than three times a week    Attends Religious Services: Never    Database administrator or Organizations: No    Attends Banker Meetings: Never    Marital Status: Divorced  Catering manager Violence: Not At Risk (10/20/2022)   Humiliation, Afraid, Rape, and Kick questionnaire    Fear of Current or Ex-Partner: No    Emotionally Abused: No    Physically Abused: No    Sexually Abused: No   Family Status  Relation Name Status   Mother  Deceased   Father  Deceased   Family History  Problem Relation Age of Onset   Alcohol abuse Mother    Liver disease Mother    Cirrhosis Mother    Heart attack Mother    No Known Allergies    Review of Systems  Constitutional:  Positive for malaise/fatigue.  HENT: Negative.    Eyes: Negative.   Respiratory:  Positive for shortness of breath. Negative for cough.   Cardiovascular:  Negative for chest pain and leg swelling.  Gastrointestinal:  Positive for abdominal pain and nausea. Negative for blood in stool, constipation, diarrhea and melena.  Genitourinary: Negative.   Musculoskeletal:  Positive for back pain and joint pain.  Skin: Negative.  Negative for itching.  Neurological: Negative.   Endo/Heme/Allergies: Negative.   Psychiatric/Behavioral: Negative.        Objective:     BP (!) 99/50   Pulse 80   Temp (!) 97.1 F (36.2 C)   Wt 139 lb (63 kg)   SpO2 100%   BMI 19.39 kg/m  BP Readings from Last 3 Encounters:  10/30/22 (!) 99/50  10/23/22 (!) 85/53  10/18/22 106/62   Wt Readings from Last 3 Encounters:  10/30/22 139 lb (63 kg)  10/22/22 138 lb 0.1 oz (62.6 kg)  10/02/22 136 lb 12.8 oz (62.1 kg)      Physical Exam Constitutional:      Appearance: He is ill-appearing.  Cardiovascular:     Rate and Rhythm: Rhythm irregular.     Pulses: Normal pulses.     Heart sounds: Murmur heard.  Pulmonary:     Effort: No respiratory distress.     Breath sounds: Normal breath sounds.   Abdominal:     General: Bowel sounds are normal. There is distension.     Tenderness: There is abdominal tenderness. There is no guarding.  Musculoskeletal:     Right knee: Swelling present. No deformity or erythema. Decreased range of  motion. Tenderness present.     Left knee: Normal.  Skin:    General: Skin is warm.  Neurological:     Mental Status: He is alert and oriented to person, place, and time.     Motor: Weakness present.     Gait: Gait abnormal.     Comments: Shuffling gait  Psychiatric:        Mood and Affect: Affect is flat.      Results for orders placed or performed in visit on 10/30/22  CBC  Result Value Ref Range   WBC 5.1 3.4 - 10.8 x10E3/uL   RBC 2.12 (LL) 4.14 - 5.80 x10E6/uL   Hemoglobin 7.9 (L) 13.0 - 17.7 g/dL   Hematocrit 11.9 (L) 14.7 - 51.0 %   MCV 104 (H) 79 - 97 fL   MCH 37.3 (H) 26.6 - 33.0 pg   MCHC 35.7 31.5 - 35.7 g/dL   RDW 82.9 (H) 56.2 - 13.0 %   Platelets 92 (LL) 150 - 450 x10E3/uL   Hematology Comments: Note:   Reticulocytes  Result Value Ref Range   Retic Ct Pct 2.8 (H) 0.6 - 2.6 %    Last CBC Lab Results  Component Value Date   WBC 5.1 10/30/2022   HGB 7.9 (L) 10/30/2022   HCT 22.1 (L) 10/30/2022   MCV 104 (H) 10/30/2022   MCH 37.3 (H) 10/30/2022   RDW 21.5 (H) 10/30/2022   PLT 92 (LL) 10/30/2022   Last metabolic panel Lab Results  Component Value Date   GLUCOSE 102 (H) 10/23/2022   NA 134 (L) 10/23/2022   K 4.1 10/23/2022   CL 95 (L) 10/23/2022   CO2 24 10/23/2022   BUN 66 (H) 10/23/2022   CREATININE 9.56 (H) 10/23/2022   GFRNONAA 6 (L) 10/23/2022   CALCIUM 8.2 (L) 10/23/2022   PHOS 4.7 (H) 10/21/2022   PROT 6.1 (L) 10/23/2022   ALBUMIN 2.9 (L) 10/23/2022   LABGLOB 3.2 08/01/2022   AGRATIO 1.3 08/01/2022   BILITOT 1.2 10/23/2022   ALKPHOS 141 (H) 10/23/2022   AST 23 10/23/2022   ALT 28 10/23/2022   ANIONGAP 15 10/23/2022   Last lipids Lab Results  Component Value Date   CHOL 114 (L) 02/17/2015   HDL  31 (L) 02/17/2015   LDLCALC 68 02/17/2015   TRIG 42 06/05/2021   CHOLHDL 3.7 02/17/2015   Last hemoglobin A1c Lab Results  Component Value Date   HGBA1C 4.9 05/25/2021   Last thyroid functions Lab Results  Component Value Date   TSH 7.919 (H) 07/24/2022   Last vitamin D Lab Results  Component Value Date   VD25OH 96.3 11/28/2021   Last vitamin B12 and Folate Lab Results  Component Value Date   VITAMINB12 596 07/24/2022   FOLATE 35.4 07/24/2022      The ASCVD Risk score (Arnett DK, et al., 2019) failed to calculate for the following reasons:   Cannot find a previous HDL lab   Cannot find a previous total cholesterol lab    Assessment & Plan:   Problem List Items Addressed This Visit   None Visit Diagnoses     Chronic pain of right knee    -  Primary   Relevant Orders   DG Knee Complete 4 Views Right   Generalized abdominal pain       Relevant Orders   CT Abdomen Pelvis Wo Contrast   Hb-SS disease without crisis (HCC)       Relevant Orders   CBC (Completed)  Reticulocytes (Completed)     1. Hospital discharge follow-up Following hospitalization, patient continues to have some fatigue, abdominal bloating and moderate abdominal pain.  Abdominal pain is generalized and tender to touch.  Abdomen slightly distended. Patient warrants continuous following by his gastroenterologist.  He will follow-up with Dr. Lauro Franklin office.  Patient advised to make a first available appointment.  Reviewed patient CT, will hold off on repeating CT at this time.  Patient underwent EGD which showed some gastritis during previous hospitalization.  Will defer to gastroenterology for further workup and evaluation of patient's ongoing abdominal pain or bloating.  For liver cirrhosis, patient may benefit from hepatology.  Will follow the patient closely.  Patient will need to be evaluated by Dr. Hyman Hopes within the next month.  2. Hb-SS disease without crisis (HCC) Prior to discharge, patient's  hemoglobin was 7.8 g/dL, which is consistent with his baseline.  He is typically not transfused unless hemoglobin is less than 6.  Patient will continue Aranesp injections per his nephrology team. Patient currently has left foot ulcer, will discontinue hydroxyurea. - CBC - Reticulocytes  3. Essential hypertension BP (!) 99/50   Pulse 80   Temp (!) 97.1 F (36.2 C)   Wt 139 lb (63 kg)   SpO2 100%   BMI 19.39 kg/m  Blood pressure controlled, and systolic low today.  Patient underwent dialysis earlier.  Will need to monitor closely.  4. Chronic pain of right knee Patient is complaining of ongoing right knee pain and swelling.  Will review x-ray results at as they become available. - DG Knee Complete 4 Views Right; Future  5. Generalized abdominal pain We will hold off on CT for right now.  Will defer to gastroenterology for further workup and evaluation of patient's abdominal pain.  Will assist patient in scheduling her first available appointment with Dr. Lauro Franklin office. - CT Abdomen Pelvis Wo Contrast; Future  6. End stage renal disease [facility]) Patient to continue follow-up with his nephrology team at dialysis center. Patient could definitely benefit from advanced medical planning and goals of care.  Will place a referral to palliative care services.  7. Chronic pain syndrome Will continue to manage patient's medications for his chronic pain.  Patient's pain has been uncontrolled on current medication regimen.  His refill requests have increased over the past several months.  Patient may benefit from long-acting OxyContin.  Discussed medication at length.  Will start a very low-dose due to ESRD.  Will follow-up with patient by phone frequently to discuss pain management. Reviewed PDMP substance reporting system prior to prescribing opiate medications. No inconsistencies noted.   8. Tobacco dependence Patient has decreased smoking.  Counseled on the dangers of smoking especially in the  setting of sickle cell disease.  Mr. Ruecker has a very complex medical history.  He has not been well over the past several months and condition appears to be deteriorating.  Will start discussions surrounding palliative care going forward.  Patient will need greater community assistance for any barriers to care.  Mr. Lepre will need to follow-up with Dr. Hyman Hopes within 1 month.    Nolon Nations  APRN, MSN, FNP-C Patient Care [facility] [address] [place], [place] [address] [phone]

## 2022-11-02 NOTE — Telephone Encounter (Signed)
Patrick [name] is 67 year old male with a medical history significant for end-stage renal disease, COPD and atrial fibrillation, and anemia of chronic disease.  Notify patient by phone to discuss previous labs.  Labs were discussed with Patrick [name] at length.  Also, patient to start OxyContin 10 mg every 12 hours for greater pain control.  Patient will hold hydroxyurea due to lower extremity wound ulcers. During recent appointment, patient complained of ongoing abdominal pain.  During recent hospitalization patient underwent EGD that showed gastritis.  Patient also had an abnormal CT of abdomen and pelvis.  He was advised to follow-up with Dr. Rhea Belton concerning gastric biopsy posthospitalization. Patient also received a referral to palliative care for advanced medical planning.  Patient and I discussed his palliative care referral at length.  Patient will also need to be connected with Patrick Butts, LCSW to address community needs.  This patient is legally blind and will warrant assistance with gradual in his gastroenterology appointment.  Please notify Dr. Lauro Franklin office to schedule a hospital follow-up appointment.  Patrick Nations  APRN, MSN, FNP-C Patient Care [facility] [address] [place], [place] [address] [phone]

## 2022-11-05 ENCOUNTER — Telehealth: Payer: Self-pay

## 2022-11-05 NOTE — Telephone Encounter (Signed)
(  2:46 pm) PC SW left a voice message for patient and his cousin -[name] requesting a call back regarding palliative care referral.

## 2022-11-06 DIAGNOSIS — N2581 Secondary hyperparathyroidism of renal origin: Secondary | ICD-10-CM | POA: Diagnosis not present

## 2022-11-06 DIAGNOSIS — D57219 Sickle-cell/Hb-C disease with crisis, unspecified: Secondary | ICD-10-CM | POA: Diagnosis not present

## 2022-11-06 DIAGNOSIS — N186 End stage renal disease: Secondary | ICD-10-CM | POA: Diagnosis not present

## 2022-11-06 DIAGNOSIS — Z992 Dependence on renal dialysis: Secondary | ICD-10-CM | POA: Diagnosis not present

## 2022-11-06 DIAGNOSIS — D5700 Hb-SS disease with crisis, unspecified: Secondary | ICD-10-CM | POA: Diagnosis not present

## 2022-11-06 DIAGNOSIS — D571 Sickle-cell disease without crisis: Secondary | ICD-10-CM | POA: Diagnosis not present

## 2022-11-06 DIAGNOSIS — L299 Pruritus, unspecified: Secondary | ICD-10-CM | POA: Diagnosis not present

## 2022-11-06 DIAGNOSIS — D631 Anemia in chronic kidney disease: Secondary | ICD-10-CM | POA: Diagnosis not present

## 2022-11-06 DIAGNOSIS — D57819 Other sickle-cell disorders with crisis, unspecified: Secondary | ICD-10-CM | POA: Diagnosis not present

## 2022-11-07 ENCOUNTER — Other Ambulatory Visit: Payer: Self-pay | Admitting: Nurse Practitioner

## 2022-11-07 ENCOUNTER — Telehealth: Payer: Self-pay | Admitting: Internal Medicine

## 2022-11-07 ENCOUNTER — Other Ambulatory Visit: Payer: Self-pay

## 2022-11-07 DIAGNOSIS — D571 Sickle-cell disease without crisis: Secondary | ICD-10-CM

## 2022-11-07 DIAGNOSIS — M545 Low back pain, unspecified: Secondary | ICD-10-CM

## 2022-11-07 DIAGNOSIS — G894 Chronic pain syndrome: Secondary | ICD-10-CM

## 2022-11-07 MED ORDER — TIZANIDINE HCL 4 MG PO TABS
ORAL_TABLET | ORAL | 2 refills | Status: DC
Start: 2022-11-07 — End: 2022-11-14

## 2022-11-07 MED ORDER — ALLOPURINOL 100 MG PO TABS: 100.0000 mg | ORAL_TABLET | Freq: Every day | ORAL | 1 refills | Status: DC

## 2022-11-07 NOTE — Telephone Encounter (Signed)
Nou from [facility] [employer] called and stated she just spoke to the pt. His wrist is very inflammed due to the gout she stated.  He is also needed a refill on his oxycodone, zanaflex, allopurinol.  

## 2022-11-07 NOTE — Telephone Encounter (Signed)
Nou from [facility] [employer] called and stated she just spoke to the pt. His wrist is very inflammed due to the gout she stated.  He is also needed a refill on his oxycodone, zanaflex, allopurinol.

## 2022-11-08 ENCOUNTER — Encounter (HOSPITAL_BASED_OUTPATIENT_CLINIC_OR_DEPARTMENT_OTHER): Payer: PPO | Admitting: Internal Medicine

## 2022-11-08 ENCOUNTER — Other Ambulatory Visit: Payer: Self-pay

## 2022-11-08 DIAGNOSIS — G894 Chronic pain syndrome: Secondary | ICD-10-CM

## 2022-11-08 DIAGNOSIS — D571 Sickle-cell disease without crisis: Secondary | ICD-10-CM

## 2022-11-08 DIAGNOSIS — L97822 Non-pressure chronic ulcer of other part of left lower leg with fat layer exposed: Secondary | ICD-10-CM | POA: Diagnosis not present

## 2022-11-08 DIAGNOSIS — I87312 Chronic venous hypertension (idiopathic) with ulcer of left lower extremity: Secondary | ICD-10-CM | POA: Diagnosis not present

## 2022-11-08 DIAGNOSIS — N186 End stage renal disease: Secondary | ICD-10-CM | POA: Diagnosis not present

## 2022-11-08 NOTE — Telephone Encounter (Signed)
Please advise in [place] absence. Kh

## 2022-11-13 ENCOUNTER — Telehealth: Payer: Self-pay

## 2022-11-13 ENCOUNTER — Telehealth: Payer: Self-pay | Admitting: Internal Medicine

## 2022-11-13 DIAGNOSIS — N186 End stage renal disease: Secondary | ICD-10-CM | POA: Diagnosis not present

## 2022-11-13 DIAGNOSIS — D57819 Other sickle-cell disorders with crisis, unspecified: Secondary | ICD-10-CM | POA: Diagnosis not present

## 2022-11-13 DIAGNOSIS — D631 Anemia in chronic kidney disease: Secondary | ICD-10-CM | POA: Diagnosis not present

## 2022-11-13 DIAGNOSIS — D5700 Hb-SS disease with crisis, unspecified: Secondary | ICD-10-CM | POA: Diagnosis not present

## 2022-11-13 DIAGNOSIS — D571 Sickle-cell disease without crisis: Secondary | ICD-10-CM | POA: Diagnosis not present

## 2022-11-13 DIAGNOSIS — Z992 Dependence on renal dialysis: Secondary | ICD-10-CM | POA: Diagnosis not present

## 2022-11-13 DIAGNOSIS — L299 Pruritus, unspecified: Secondary | ICD-10-CM | POA: Diagnosis not present

## 2022-11-13 DIAGNOSIS — D57219 Sickle-cell/Hb-C disease with crisis, unspecified: Secondary | ICD-10-CM | POA: Diagnosis not present

## 2022-11-13 DIAGNOSIS — N2581 Secondary hyperparathyroidism of renal origin: Secondary | ICD-10-CM | POA: Diagnosis not present

## 2022-11-13 NOTE — Telephone Encounter (Signed)
Caller & Relationship to patient:  MRN #  [id number]   Call Back Number:   Date of Last Office Visit: 11/13/2022     Date of Next Office Visit: Visit date not found    Medication(s) to be Refilled: Oxycodone and muscle relaxers  Preferred Pharmacy:   ** Please notify patient to allow 48-72 hours to process** **Let patient know to contact pharmacy at the end of the day to make sure medication is ready. ** **If patient has not been seen in a year or longer, book an appointment **Advise to use MyChart for refill requests OR to contact their pharmacy

## 2022-11-13 NOTE — Telephone Encounter (Signed)
Error

## 2022-11-14 ENCOUNTER — Other Ambulatory Visit: Payer: Self-pay | Admitting: Family Medicine

## 2022-11-14 DIAGNOSIS — D571 Sickle-cell disease without crisis: Secondary | ICD-10-CM

## 2022-11-14 DIAGNOSIS — M545 Low back pain, unspecified: Secondary | ICD-10-CM

## 2022-11-14 MED ORDER — TIZANIDINE HCL 4 MG PO TABS
ORAL_TABLET | ORAL | 2 refills | Status: DC
Start: 2022-11-14 — End: 2023-05-12

## 2022-11-14 MED ORDER — OXYCODONE HCL 10 MG PO TABS
10.0000 mg | ORAL_TABLET | Freq: Four times a day (QID) | ORAL | 0 refills | Status: DC | PRN
Start: 2022-11-16 — End: 2022-11-29

## 2022-11-14 NOTE — Progress Notes (Signed)
Reviewed PDMP substance reporting system prior to prescribing opiate medications. No inconsistencies noted.  Meds ordered this encounter  Medications   Oxycodone HCl 10 MG TABS    Sig: Take 1 tablet (10 mg total) by mouth every 6 (six) hours as needed.    Dispense:  60 tablet    Refill:  0    Order Specific Question:   Supervising Provider    Answer:   JEGEDE, OLUGBEMIGA E [[id number]]   tiZANidine (ZANAFLEX) 4 MG tablet    Sig: TAKE 1/2 TABLET (2MG TOTAL) BY MOUTH EVERY 8 HOURS AS NEEDED FOR MUSCLE SPASM    Dispense:  30 tablet    Refill:  2    Order Specific Question:   Supervising Provider    Answer:   JEGEDE, OLUGBEMIGA E [[id number]]   Kristena Wilhelmi Moore Shawanda Sievert  APRN, MSN, FNP-C Patient Care [facility] [address]  [place], [place] [address] [phone]  

## 2022-11-15 ENCOUNTER — Other Ambulatory Visit: Payer: Self-pay

## 2022-11-15 DIAGNOSIS — Z515 Encounter for palliative care: Secondary | ICD-10-CM

## 2022-11-15 NOTE — Progress Notes (Signed)
COMMUNITY PALLIATIVE CARE SW NOTE  PATIENT NAME: Patrick [name] DOB: 05-17-1956 MRN: [id number]  PRIMARY CARE PROVIDER: Quentin Angst, MD  RESPONSIBLE PARTY:  Acct ID - Guarantor Home Phone Work Phone Relationship Acct Type  [id number] SAISH, WEATHERWAX* [phone]  Self P/F     [address] [place], [place] [address]   Palliative Care Visit/Clinical Social Work  SW met with patient at his home. SW provided education to him regarding palliative care services. He verbalized understanding of the services . He signed consents and provided a status update on himself.  Patient has gout flair-up in his hand. He has dialysis every Tues, Thurs. & Sat. Patient report he is eating at least 3 or more meals per day depending on how he is feeling. Patient is able to cook his own meals. Patient take SCAT/Access to and from dialysis. Patient has fluid restriction to 32 oz. of water a day. He also has sickle cell anemia.  Patient lives alone. No public assistance. Patient is blind and uses a magnifier.   Patient born and raised in [place] and later [place], [place]. Graduated high school, took some college courses. He was a Merchandiser, retail at [employer]. He is currently employed at [employer] for the [employer] and report that he will be returning to work on Monday. Patient expressed that he did not feel he needed the services at this time, but states he would like to talk to the nurse.  SW will scheduled a telephonic visit with the nurse.  Patient has a Living Will, POA/HCPOA. His cousins are very supportive of him and assist him with managing his finances and advocates for his medical. SW left two DNR's in the home per patient's request.      Social History   Tobacco Use   Smoking status: Former    Packs/day: 0.50    Years: 41.00    Additional pack years: 0.00    Total pack years: 20.50    Types: Cigarettes    Quit date: 12/08/2015    Years since quitting: 6.9   Smokeless tobacco: Never   Substance Use Topics   Alcohol use: Not Currently    Alcohol/week: 3.0 standard drinks of alcohol    Types: 1 Glasses of wine, 2 Cans of beer per week    Comment: none since starting dialysis 06/2021    CODE STATUS: DNR, forms left in the home ADVANCED DIRECTIVES: Yes MOST FORM COMPLETE:  No HOSPICE EDUCATION PROVIDED: No  Duration of visit and documentation: 30 minutes  [employer], LCSW

## 2022-11-16 DIAGNOSIS — N186 End stage renal disease: Secondary | ICD-10-CM | POA: Diagnosis not present

## 2022-11-16 DIAGNOSIS — I158 Other secondary hypertension: Secondary | ICD-10-CM | POA: Diagnosis not present

## 2022-11-16 DIAGNOSIS — Z992 Dependence on renal dialysis: Secondary | ICD-10-CM | POA: Diagnosis not present

## 2022-11-19 ENCOUNTER — Telehealth: Payer: Self-pay

## 2022-11-19 DIAGNOSIS — R1030 Lower abdominal pain, unspecified: Secondary | ICD-10-CM

## 2022-11-19 NOTE — Telephone Encounter (Signed)
Called pt and advise message. Gh

## 2022-11-19 NOTE — Telephone Encounter (Signed)
Urgent Referral place today to medication management. Gh

## 2022-11-19 NOTE — Telephone Encounter (Signed)
Urgent referral place today for medication management. Gh

## 2022-11-20 ENCOUNTER — Encounter (HOSPITAL_COMMUNITY): Payer: Self-pay | Admitting: Emergency Medicine

## 2022-11-20 ENCOUNTER — Other Ambulatory Visit: Payer: Self-pay

## 2022-11-20 ENCOUNTER — Inpatient Hospital Stay (HOSPITAL_COMMUNITY)
Admission: EM | Admit: 2022-11-20 | Discharge: 2022-11-27 | DRG: 811 | Disposition: A | Discharge status: Home / Self Care | Payer: PPO | Attending: Internal Medicine | Admitting: Internal Medicine

## 2022-11-20 ENCOUNTER — Emergency Department (HOSPITAL_COMMUNITY): Payer: PPO

## 2022-11-20 DIAGNOSIS — Z8701 Personal history of pneumonia (recurrent): Secondary | ICD-10-CM

## 2022-11-20 DIAGNOSIS — I959 Hypotension, unspecified: Secondary | ICD-10-CM | POA: Diagnosis present

## 2022-11-20 DIAGNOSIS — D649 Anemia, unspecified: Principal | ICD-10-CM | POA: Diagnosis present

## 2022-11-20 DIAGNOSIS — R079 Chest pain, unspecified: Secondary | ICD-10-CM | POA: Diagnosis not present

## 2022-11-20 DIAGNOSIS — Z8249 Family history of ischemic heart disease and other diseases of the circulatory system: Secondary | ICD-10-CM

## 2022-11-20 DIAGNOSIS — N186 End stage renal disease: Secondary | ICD-10-CM | POA: Diagnosis present

## 2022-11-20 DIAGNOSIS — D5700 Hb-SS disease with crisis, unspecified: Principal | ICD-10-CM | POA: Diagnosis present

## 2022-11-20 DIAGNOSIS — G894 Chronic pain syndrome: Secondary | ICD-10-CM | POA: Diagnosis present

## 2022-11-20 DIAGNOSIS — I5022 Chronic systolic (congestive) heart failure: Secondary | ICD-10-CM | POA: Diagnosis not present

## 2022-11-20 DIAGNOSIS — L299 Pruritus, unspecified: Secondary | ICD-10-CM | POA: Diagnosis not present

## 2022-11-20 DIAGNOSIS — M109 Gout, unspecified: Secondary | ICD-10-CM | POA: Diagnosis present

## 2022-11-20 DIAGNOSIS — Z87891 Personal history of nicotine dependence: Secondary | ICD-10-CM

## 2022-11-20 DIAGNOSIS — D571 Sickle-cell disease without crisis: Secondary | ICD-10-CM | POA: Diagnosis not present

## 2022-11-20 DIAGNOSIS — D57219 Sickle-cell/Hb-C disease with crisis, unspecified: Secondary | ICD-10-CM | POA: Diagnosis not present

## 2022-11-20 DIAGNOSIS — Z992 Dependence on renal dialysis: Secondary | ICD-10-CM | POA: Diagnosis not present

## 2022-11-20 DIAGNOSIS — K219 Gastro-esophageal reflux disease without esophagitis: Secondary | ICD-10-CM | POA: Diagnosis present

## 2022-11-20 DIAGNOSIS — Z9049 Acquired absence of other specified parts of digestive tract: Secondary | ICD-10-CM

## 2022-11-20 DIAGNOSIS — D696 Thrombocytopenia, unspecified: Secondary | ICD-10-CM | POA: Diagnosis present

## 2022-11-20 DIAGNOSIS — I4821 Permanent atrial fibrillation: Secondary | ICD-10-CM | POA: Diagnosis present

## 2022-11-20 DIAGNOSIS — N2581 Secondary hyperparathyroidism of renal origin: Secondary | ICD-10-CM | POA: Diagnosis not present

## 2022-11-20 DIAGNOSIS — J449 Chronic obstructive pulmonary disease, unspecified: Secondary | ICD-10-CM | POA: Diagnosis present

## 2022-11-20 DIAGNOSIS — I120 Hypertensive chronic kidney disease with stage 5 chronic kidney disease or end stage renal disease: Secondary | ICD-10-CM | POA: Diagnosis not present

## 2022-11-20 DIAGNOSIS — Z79899 Other long term (current) drug therapy: Secondary | ICD-10-CM

## 2022-11-20 DIAGNOSIS — K254 Chronic or unspecified gastric ulcer with hemorrhage: Secondary | ICD-10-CM | POA: Diagnosis present

## 2022-11-20 DIAGNOSIS — R195 Other fecal abnormalities: Secondary | ICD-10-CM | POA: Diagnosis present

## 2022-11-20 DIAGNOSIS — R1011 Right upper quadrant pain: Secondary | ICD-10-CM | POA: Diagnosis present

## 2022-11-20 DIAGNOSIS — K922 Gastrointestinal hemorrhage, unspecified: Secondary | ICD-10-CM | POA: Diagnosis not present

## 2022-11-20 DIAGNOSIS — D57819 Other sickle-cell disorders with crisis, unspecified: Secondary | ICD-10-CM | POA: Diagnosis not present

## 2022-11-20 DIAGNOSIS — Z91158 Patient's noncompliance with renal dialysis for other reason: Secondary | ICD-10-CM

## 2022-11-20 DIAGNOSIS — Z79891 Long term (current) use of opiate analgesic: Secondary | ICD-10-CM

## 2022-11-20 DIAGNOSIS — H548 Legal blindness, as defined in USA: Secondary | ICD-10-CM | POA: Diagnosis present

## 2022-11-20 DIAGNOSIS — K449 Diaphragmatic hernia without obstruction or gangrene: Secondary | ICD-10-CM | POA: Diagnosis present

## 2022-11-20 DIAGNOSIS — R0902 Hypoxemia: Secondary | ICD-10-CM | POA: Diagnosis not present

## 2022-11-20 DIAGNOSIS — I132 Hypertensive heart and chronic kidney disease with heart failure and with stage 5 chronic kidney disease, or end stage renal disease: Secondary | ICD-10-CM | POA: Diagnosis present

## 2022-11-20 DIAGNOSIS — Z66 Do not resuscitate: Secondary | ICD-10-CM | POA: Diagnosis present

## 2022-11-20 DIAGNOSIS — R188 Other ascites: Secondary | ICD-10-CM | POA: Diagnosis present

## 2022-11-20 DIAGNOSIS — E8889 Other specified metabolic disorders: Secondary | ICD-10-CM | POA: Diagnosis present

## 2022-11-20 DIAGNOSIS — I272 Pulmonary hypertension, unspecified: Secondary | ICD-10-CM | POA: Diagnosis present

## 2022-11-20 DIAGNOSIS — K7460 Unspecified cirrhosis of liver: Secondary | ICD-10-CM | POA: Diagnosis present

## 2022-11-20 DIAGNOSIS — I081 Rheumatic disorders of both mitral and tricuspid valves: Secondary | ICD-10-CM | POA: Diagnosis present

## 2022-11-20 LAB — HEPATIC FUNCTION PANEL
ALT: 40 U/L (ref 0–44)
AST: 61 U/L — ABNORMAL HIGH (ref 15–41)
Albumin: 3.6 g/dL (ref 3.5–5.0)
Alkaline Phosphatase: 230 U/L — ABNORMAL HIGH (ref 38–126)
Bilirubin, Direct: 1 mg/dL — ABNORMAL HIGH (ref 0.0–0.2)
Indirect Bilirubin: 1.2 mg/dL — ABNORMAL HIGH (ref 0.3–0.9)
Total Bilirubin: 2.2 mg/dL — ABNORMAL HIGH (ref 0.3–1.2)
Total Protein: 7.9 g/dL (ref 6.5–8.1)

## 2022-11-20 LAB — CBC
HCT: 17.1 % — ABNORMAL LOW (ref 39.0–52.0)
Hemoglobin: 6.3 g/dL — CL (ref 13.0–17.0)
MCH: 38.4 pg — ABNORMAL HIGH (ref 26.0–34.0)
MCHC: 36.8 g/dL — ABNORMAL HIGH (ref 30.0–36.0)
MCV: 104.3 fL — ABNORMAL HIGH (ref 80.0–100.0)
Platelets: 118 10*3/uL — ABNORMAL LOW (ref 150–400)
RBC: 1.64 MIL/uL — ABNORMAL LOW (ref 4.22–5.81)
RDW: 23.1 % — ABNORMAL HIGH (ref 11.5–15.5)
WBC: 6.6 10*3/uL (ref 4.0–10.5)
nRBC: 9.9 % — ABNORMAL HIGH (ref 0.0–0.2)

## 2022-11-20 LAB — BASIC METABOLIC PANEL
Anion gap: 17 — ABNORMAL HIGH (ref 5–15)
BUN: 45 mg/dL — ABNORMAL HIGH (ref 8–23)
CO2: 29 mmol/L (ref 22–32)
Calcium: 8.8 mg/dL — ABNORMAL LOW (ref 8.9–10.3)
Chloride: 92 mmol/L — ABNORMAL LOW (ref 98–111)
Creatinine, Ser: 5.63 mg/dL — ABNORMAL HIGH (ref 0.61–1.24)
GFR, Estimated: 11 mL/min — ABNORMAL LOW (ref >60)
Glucose, Bld: 89 mg/dL (ref 70–99)
Potassium: 3.3 mmol/L — ABNORMAL LOW (ref 3.5–5.1)
Sodium: 138 mmol/L (ref 135–145)

## 2022-11-20 LAB — TYPE AND SCREEN

## 2022-11-20 LAB — POC OCCULT BLOOD, ED: Fecal Occult Bld: POSITIVE — AB

## 2022-11-20 LAB — RETICULOCYTES
Immature Retic Fract: 25.1 % — ABNORMAL HIGH (ref 2.3–15.9)
RBC.: 1.63 MIL/uL — ABNORMAL LOW (ref 4.22–5.81)
Retic Count, Absolute: 169 10*3/uL (ref 19.0–186.0)
Retic Ct Pct: 10.3 % — ABNORMAL HIGH (ref 0.4–3.1)

## 2022-11-20 LAB — LIPASE, BLOOD: Lipase: 33 U/L (ref 11–51)

## 2022-11-20 LAB — BPAM RBC

## 2022-11-20 LAB — TROPONIN I (HIGH SENSITIVITY)
Troponin I (High Sensitivity): 41 ng/L — ABNORMAL HIGH (ref <18)
Troponin I (High Sensitivity): 42 ng/L — ABNORMAL HIGH (ref <18)

## 2022-11-20 LAB — PREPARE RBC (CROSSMATCH)

## 2022-11-20 LAB — MRSA NEXT GEN BY PCR, NASAL: MRSA by PCR Next Gen: NOT DETECTED

## 2022-11-20 MED ORDER — HYDROMORPHONE HCL 1 MG/ML IJ SOLN
2.0000 mg | INTRAMUSCULAR | Status: AC
  Administered 2022-11-20: 2 mg via INTRAVENOUS
  Filled 2022-11-20: qty 2

## 2022-11-20 MED ORDER — PANTOPRAZOLE SODIUM 40 MG PO TBEC
40.0000 mg | DELAYED_RELEASE_TABLET | Freq: Two times a day (BID) | ORAL | Status: DC
  Administered 2022-11-20 – 2022-11-27 (×14): 40 mg via ORAL
  Filled 2022-11-20 (×14): qty 1

## 2022-11-20 MED ORDER — HYDROMORPHONE HCL 1 MG/ML IJ SOLN: 2.0000 mg | INTRAMUSCULAR | Status: AC

## 2022-11-20 MED ORDER — SODIUM CHLORIDE 0.9% IV SOLUTION: Freq: Once | INTRAVENOUS | Status: DC

## 2022-11-20 MED ORDER — FERRIC CITRATE 1 GM 210 MG(FE) PO TABS: 210.0000 mg | ORAL_TABLET | ORAL | Status: DC

## 2022-11-20 MED ORDER — FERRIC CITRATE 1 GM 210 MG(FE) PO TABS
210.0000 mg | ORAL_TABLET | ORAL | Status: DC
  Administered 2022-11-20 – 2022-11-26 (×8): 210 mg via ORAL
  Filled 2022-11-20 (×6): qty 1

## 2022-11-20 MED ORDER — SODIUM CHLORIDE 0.45 % IV SOLN: INTRAVENOUS | Status: DC

## 2022-11-20 MED ORDER — HYDROMORPHONE HCL 1 MG/ML IJ SOLN
0.5000 mg | INTRAMUSCULAR | Status: DC | PRN
  Administered 2022-11-20 – 2022-11-21 (×3): 0.5 mg via INTRAVENOUS
  Filled 2022-11-20 (×3): qty 1

## 2022-11-20 MED ORDER — LIDOCAINE-PRILOCAINE 2.5-2.5 % EX CREA
1.0000 | TOPICAL_CREAM | CUTANEOUS | Status: DC
  Filled 2022-11-20: qty 5

## 2022-11-20 MED ORDER — ONDANSETRON HCL 4 MG/2ML IJ SOLN
4.0000 mg | INTRAMUSCULAR | Status: DC | PRN
  Filled 2022-11-20: qty 2

## 2022-11-20 MED ORDER — OXYCODONE HCL 5 MG PO TABS
10.0000 mg | ORAL_TABLET | Freq: Four times a day (QID) | ORAL | Status: DC | PRN
  Administered 2022-11-20 – 2022-11-22 (×2): 10 mg via ORAL
  Filled 2022-11-20 (×2): qty 2

## 2022-11-20 MED ORDER — ALBUTEROL SULFATE (2.5 MG/3ML) 0.083% IN NEBU: 2.5000 mg | INHALATION_SOLUTION | Freq: Four times a day (QID) | RESPIRATORY_TRACT | Status: DC | PRN

## 2022-11-20 MED ORDER — OXYCODONE HCL ER 10 MG PO T12A
10.0000 mg | EXTENDED_RELEASE_TABLET | Freq: Two times a day (BID) | ORAL | Status: DC
  Administered 2022-11-21 – 2022-11-27 (×11): 10 mg via ORAL
  Filled 2022-11-20 (×12): qty 1

## 2022-11-20 MED ORDER — FOLIC ACID 1 MG PO TABS
1.0000 mg | ORAL_TABLET | Freq: Every day | ORAL | Status: DC
  Administered 2022-11-20 – 2022-11-27 (×8): 1 mg via ORAL
  Filled 2022-11-20 (×8): qty 1

## 2022-11-20 MED ORDER — ACETAMINOPHEN 650 MG RE SUPP: 650.0000 mg | Freq: Four times a day (QID) | RECTAL | Status: DC | PRN

## 2022-11-20 MED ORDER — LACTULOSE 10 GM/15ML PO SOLN: 20.0000 g | Freq: Two times a day (BID) | ORAL | Status: DC | PRN

## 2022-11-20 MED ORDER — FERRIC CITRATE 1 GM 210 MG(FE) PO TABS
420.0000 mg | ORAL_TABLET | Freq: Three times a day (TID) | ORAL | Status: DC
  Administered 2022-11-21 – 2022-11-27 (×15): 420 mg via ORAL
  Filled 2022-11-20 (×18): qty 2

## 2022-11-20 MED ORDER — ACETAMINOPHEN 325 MG PO TABS
650.0000 mg | ORAL_TABLET | Freq: Four times a day (QID) | ORAL | Status: DC | PRN
  Administered 2022-11-24: 650 mg via ORAL
  Filled 2022-11-20 (×2): qty 2

## 2022-11-20 MED ORDER — SODIUM CHLORIDE 0.9% FLUSH
3.0000 mL | Freq: Two times a day (BID) | INTRAVENOUS | Status: DC
  Administered 2022-11-20 – 2022-11-27 (×13): 3 mL via INTRAVENOUS

## 2022-11-20 NOTE — H&P (Signed)
History and Physical    Patient: Patrick [name]:[id number] DOB: [birth year] DOA: 11/20/2022 DOS: the patient was seen and examined on 11/20/2022 PCP: Quentin Angst, MD  Patient coming from: Home  Chief Complaint:  Chief Complaint  Patient presents with   Pain   Chest Pain   Abdominal Pain   HPI: Patrick [name] is a 67 y.o. male with medical history significant of persistent atrial fibrillation not on anticoagulation due to bleeding, systolic CHF, ESRD on HD (TTS), COPD, sickle cell disease, hypotension, chronic pain syndrome, thrombocytopenia, liver cirrhosis, history of GI bleed secondary to angiodysplasia who presents with complaints of worsening pain after his hemodialysis session today.  There is note patient had just recently been hospitalized from 5/4-5/7 due to symptomatic anemia requiring blood transfusion.  EGD performed by Dr. Rhea Belton on 5/6 noted normal esophagus, 2 cm hiatal hernia, acute gastritis with ulcerations and erosions (biopsies taken negative for H. Pylori), and normal duodenum.gastritis with gastric ulcers and erosions.  The patient reports having severe pain in his legs and arms like someone was hitting him with a sledgehammer.  Also complained of having chest pain/tightness, abdominal pain, and some shortness of breath.  Denies any use of NSAIDs.  Patient has not had any significant fever or chills.  He is unsure if he is had any blood in his stools due to his poor vision.   In the emergency department patient was noted to be afebrile with heart rates 100-1 28, blood pressures 101/51 to 106/73, and all other vital signs maintained.  Labs significant for hemoglobin 6.3, platelets 118, potassium 3.3, BUN 45, creatinine 5.63, and anion gap 17.  Stool guaiacs was noted to be positive.  Patient has been ordered to be transfused 1 unit of packed red blood cells.  Review of Systems: As mentioned in the history of present illness. All other systems reviewed and  are negative. Past Medical History:  Diagnosis Date   A-fib (HCC)    Blood dyscrasia    Blood transfusion    "I've had a bunch of them"   CHF (congestive heart failure) (HCC)    followed by hf clinic   Chronic kidney disease    Stage 5, dialysis on Tu/Th/Sa   Dialysis patient (HCC) 04/2019   AV fistula (right arm), TTS   Dysrhythmia    A-Fib per patient   Early satiety    Elevated LFTs 04/01/2022   Elevated troponin 04/01/2022   Empyema of gallbladder 05/27/2021   GERD (gastroesophageal reflux disease)    Gout    Legally blind    Metabolic acidosis with increased anion gap and accumulation of organic acids 04/01/2022   Mitral regurgitation    Muscle tightness-Right arm  05/27/2014   Pneumonia    Pulmonary hypertension (HCC)    Shortness of breath dyspnea    Sickle cell disease, type SS (HCC)    Swelling abdomen    Swelling of extremity, left    Swelling of extremity, right    Tricuspid regurgitation    Past Surgical History:  Procedure Laterality Date   A/V FISTULAGRAM Right 07/29/2020   Procedure: A/V FISTULAGRAM - Right Arm;  Surgeon: Chuck Hint, MD;  Location: [facility] INVASIVE CV LAB;  Service: Cardiovascular;  Laterality: Right;   BASCILIC VEIN TRANSPOSITION Right 04/12/2014   Procedure: BASILIC VEIN TRANSPOSITION;  Surgeon: Sherren Kerns, MD;  Location: [facility] OR;  Service: Vascular;  Laterality: Right;   BILIARY STENT PLACEMENT  04/04/2022   Procedure: BILIARY STENT  PLACEMENT;  Surgeon: Meryl Dare, MD;  Location: [facility] ENDOSCOPY;  Service: Gastroenterology;;   BIOPSY  08/04/2020   Procedure: BIOPSY;  Surgeon: Meryl Dare, MD;  Location: [facility] ENDOSCOPY;  Service: Gastroenterology;;   BIOPSY  06/28/2021   Procedure: BIOPSY;  Surgeon: Beverley Fiedler, MD;  Location: [facility] ENDOSCOPY;  Service: Gastroenterology;;   BIOPSY  04/02/2022   Procedure: BIOPSY;  Surgeon: Lemar Lofty., MD;  Location: [facility] ENDOSCOPY;  Service: Gastroenterology;;   BIOPSY   10/22/2022   Procedure: BIOPSY;  Surgeon: Beverley Fiedler, MD;  Location: [facility] ENDOSCOPY;  Service: Gastroenterology;;   BOWEL RESECTION N/A 05/24/2021   Procedure: SMALL BOWEL RESECTION;  Surgeon: Emelia Loron, MD;  Location: [facility] OR;  Service: General;  Laterality: N/A;   CARDIOVERSION N/A 06/05/2018   Procedure: CARDIOVERSION;  Surgeon: Dolores Patty, MD;  Location: [facility] ENDOSCOPY;  Service: Cardiovascular;  Laterality: N/A;   CARDIOVERSION N/A 10/06/2019   Procedure: CARDIOVERSION;  Surgeon: Dolores Patty, MD;  Location: [facility] ENDOSCOPY;  Service: Cardiovascular;  Laterality: N/A;   CHOLECYSTECTOMY N/A 05/24/2021   Procedure: OPEN CHOLECYSTECTOMY;  Surgeon: Emelia Loron, MD;  Location: [facility] OR;  Service: General;  Laterality: N/A;   COLONOSCOPY WITH PROPOFOL N/A 09/24/2017   Procedure: COLONOSCOPY WITH PROPOFOL;  Surgeon: Lynann Bologna, MD;  Location: [facility] ENDOSCOPY;  Service: Endoscopy;  Laterality: N/A;   ENDOSCOPIC RETROGRADE CHOLANGIOPANCREATOGRAPHY (ERCP) WITH PROPOFOL N/A 05/17/2022   Procedure: ENDOSCOPIC RETROGRADE CHOLANGIOPANCREATOGRAPHY (ERCP) WITH PROPOFOL;  Surgeon: Meridee Score Netty Starring., MD;  Location: [facility] ENDOSCOPY;  Service: Gastroenterology;  Laterality: N/A;   ERCP N/A 04/04/2022   Procedure: ENDOSCOPIC RETROGRADE CHOLANGIOPANCREATOGRAPHY (ERCP);  Surgeon: Meryl Dare, MD;  Location: [facility] ENDOSCOPY;  Service: Gastroenterology;  Laterality: N/A;   ESOPHAGOGASTRODUODENOSCOPY N/A 09/19/2017   Procedure: ESOPHAGOGASTRODUODENOSCOPY (EGD);  Surgeon: Lynann Bologna, MD;  Location: [facility] ENDOSCOPY;  Service: Endoscopy;  Laterality: N/A;   ESOPHAGOGASTRODUODENOSCOPY N/A 05/29/2021   Procedure: ESOPHAGOGASTRODUODENOSCOPY (EGD);  Surgeon: Rachael Fee, MD;  Location: [facility] ENDOSCOPY;  Service: Gastroenterology;  Laterality: N/A;   ESOPHAGOGASTRODUODENOSCOPY (EGD) WITH PROPOFOL N/A 08/04/2020   Procedure: ESOPHAGOGASTRODUODENOSCOPY (EGD) WITH PROPOFOL;  Surgeon: Meryl Dare, MD;  Location: [facility] ENDOSCOPY;  Service: Gastroenterology;  Laterality: N/A;   ESOPHAGOGASTRODUODENOSCOPY (EGD) WITH PROPOFOL N/A 06/28/2021   Procedure: ESOPHAGOGASTRODUODENOSCOPY (EGD) WITH PROPOFOL;  Surgeon: Beverley Fiedler, MD;  Location: [facility] ENDOSCOPY;  Service: Gastroenterology;  Laterality: N/A;   ESOPHAGOGASTRODUODENOSCOPY (EGD) WITH PROPOFOL N/A 08/10/2021   Procedure: ESOPHAGOGASTRODUODENOSCOPY (EGD) WITH PROPOFOL;  Surgeon: Lynann Bologna, MD;  Location: [facility] ENDOSCOPY;  Service: Gastroenterology;  Laterality: N/A;   ESOPHAGOGASTRODUODENOSCOPY (EGD) WITH PROPOFOL N/A 08/02/2021   Procedure: ESOPHAGOGASTRODUODENOSCOPY (EGD) WITH PROPOFOL;  Surgeon: Shellia Cleverly, DO;  Location: [facility] ENDOSCOPY;  Service: Gastroenterology;  Laterality: N/A;   ESOPHAGOGASTRODUODENOSCOPY (EGD) WITH PROPOFOL N/A 04/02/2022   Procedure: ESOPHAGOGASTRODUODENOSCOPY (EGD) WITH PROPOFOL;  Surgeon: Meridee Score Netty Starring., MD;  Location: [facility] ENDOSCOPY;  Service: Gastroenterology;  Laterality: N/A;   ESOPHAGOGASTRODUODENOSCOPY (EGD) WITH PROPOFOL N/A 05/17/2022   Procedure: ESOPHAGOGASTRODUODENOSCOPY (EGD) WITH PROPOFOL;  Surgeon: Meridee Score Netty Starring., MD;  Location: [facility] ENDOSCOPY;  Service: Gastroenterology;  Laterality: N/A;   ESOPHAGOGASTRODUODENOSCOPY (EGD) WITH PROPOFOL N/A 10/22/2022   Procedure: ESOPHAGOGASTRODUODENOSCOPY (EGD) WITH PROPOFOL;  Surgeon: Beverley Fiedler, MD;  Location: [facility] ENDOSCOPY;  Service: Gastroenterology;  Laterality: N/A;   EUS N/A 05/17/2022   Procedure: UPPER ENDOSCOPIC ULTRASOUND (EUS) RADIAL;  Surgeon: Lemar Lofty., MD;  Location: [facility] ENDOSCOPY;  Service: Gastroenterology;  Laterality: N/A;   FINE NEEDLE ASPIRATION  04/02/2022  Procedure: FINE NEEDLE ASPIRATION (FNA) LINEAR;  Surgeon: Lemar Lofty., MD;  Location: [facility] ENDOSCOPY;  Service: Gastroenterology;;   FINE NEEDLE ASPIRATION N/A 05/17/2022   Procedure: FINE NEEDLE ASPIRATION (FNA) LINEAR;  Surgeon: Lemar Lofty., MD;  Location: [facility] ENDOSCOPY;  Service: Gastroenterology;  Laterality: N/A;   HEMOSTASIS CLIP PLACEMENT  05/29/2021   Procedure: HEMOSTASIS CLIP PLACEMENT;  Surgeon: Rachael Fee, MD;  Location: [facility] ENDOSCOPY;  Service: Gastroenterology;;   HOT HEMOSTASIS N/A 05/29/2021   Procedure: HOT HEMOSTASIS (ARGON PLASMA COAGULATION/BICAP);  Surgeon: Rachael Fee, MD;  Location: [facility] ENDOSCOPY;  Service: Gastroenterology;  Laterality: N/A;   HOT HEMOSTASIS N/A 06/28/2021   Procedure: HOT HEMOSTASIS (ARGON PLASMA COAGULATION/BICAP);  Surgeon: Beverley Fiedler, MD;  Location: [facility] ENDOSCOPY;  Service: Gastroenterology;  Laterality: N/A;   HOT HEMOSTASIS N/A 08/10/2021   Procedure: HOT HEMOSTASIS (ARGON PLASMA COAGULATION/BICAP);  Surgeon: Lynann Bologna, MD;  Location: [facility] ENDOSCOPY;  Service: Gastroenterology;  Laterality: N/A;   HOT HEMOSTASIS N/A 08/02/2021   Procedure: HOT HEMOSTASIS (ARGON PLASMA COAGULATION/BICAP);  Surgeon: Shellia Cleverly, DO;  Location: [facility] ENDOSCOPY;  Service: Gastroenterology;  Laterality: N/A;   IR PARACENTESIS  07/04/2022   IR PERC TUN PERIT CATH WO PORT S&I /IMAG  05/23/2021   IR REMOVAL TUN CV CATH W/O FL  06/10/2021   IR US GUIDE VASC ACCESS RIGHT  05/23/2021   LAPAROSCOPY N/A 05/24/2021   Procedure: LAPAROSCOPY DIAGNOSTIC;  Surgeon: Emelia Loron, MD;  Location: [facility] OR;  Service: General;  Laterality: N/A;   LAPAROTOMY N/A 05/24/2021   Procedure: EXPLORATORY LAPAROTOMY;  Surgeon: Emelia Loron, MD;  Location: [facility] OR;  Service: General;  Laterality: N/A;   LEFT AND RIGHT HEART CATHETERIZATION WITH CORONARY ANGIOGRAM N/A 06/11/2011   Procedure: LEFT AND RIGHT HEART CATHETERIZATION WITH CORONARY ANGIOGRAM;  Surgeon: Laurey Morale, MD;  Location: [facility] CATH LAB;  Service: Cardiovascular;  Laterality: N/A;   PERIPHERAL VASCULAR BALLOON ANGIOPLASTY Right 07/29/2020   Procedure: PERIPHERAL VASCULAR BALLOON ANGIOPLASTY;  Surgeon: Chuck Hint, MD;   Location: [facility] INVASIVE CV LAB;  Service: Cardiovascular;  Laterality: Right;  arm fistula   REMOVAL OF STONES  05/17/2022   Procedure: REMOVAL OF STONES;  Surgeon: Meridee Score Netty Starring., MD;  Location: [facility] ENDOSCOPY;  Service: Gastroenterology;;   REVISON OF ARTERIOVENOUS FISTULA Right 08/08/2020   Procedure: ANEURYSM EXCISION OF RIGHT UPPER EXTREMITY ARTERIOVENOUS FISTULA;  Surgeon: Chuck Hint, MD;  Location: [facility] OR;  Service: Vascular;  Laterality: Right;   REVISON OF ARTERIOVENOUS FISTULA Right 03/21/2022   Procedure: EXCISION OF ULCERATED SKIN OF RIGHT ARTERIOVENOUS FISTULA REVISION;  Surgeon: Cephus Shelling, MD;  Location: [facility] OR;  Service: Vascular;  Laterality: Right;   REVISON OF ARTERIOVENOUS FISTULA Right 07/02/2022   Procedure: EXCISION OF ULCERS RIGHT ARTERIOVENOUS FISTULA;  Surgeon: Chuck Hint, MD;  Location: [facility] OR;  Service: Vascular;  Laterality: Right;   SCLEROTHERAPY  05/29/2021   Procedure: Susa Day;  Surgeon: Rachael Fee, MD;  Location: [facility] ENDOSCOPY;  Service: Gastroenterology;;   Dennison Mascot  04/04/2022   Procedure: SPHINCTEROTOMY;  Surgeon: Meryl Dare, MD;  Location: [facility] ENDOSCOPY;  Service: Gastroenterology;;   Francine Graven REMOVAL  05/29/2021   Procedure: STENT REMOVAL;  Surgeon: Rachael Fee, MD;  Location: [facility] ENDOSCOPY;  Service: Gastroenterology;;   Francine Graven REMOVAL  05/17/2022   Procedure: STENT REMOVAL;  Surgeon: Lemar Lofty., MD;  Location: [facility] ENDOSCOPY;  Service: Gastroenterology;;   TEE WITHOUT CARDIOVERSION N/A 12/27/2015   Procedure: TRANSESOPHAGEAL ECHOCARDIOGRAM (TEE);  Surgeon:  Quintella Reichert, MD;  Location: [facility] ENDOSCOPY;  Service: Cardiovascular;  Laterality: N/A;   UPPER ESOPHAGEAL ENDOSCOPIC ULTRASOUND (EUS) N/A 04/02/2022   Procedure: UPPER ESOPHAGEAL ENDOSCOPIC ULTRASOUND (EUS);  Surgeon: Lemar Lofty., MD;  Location: [facility] ENDOSCOPY;  Service: Gastroenterology;  Laterality: N/A;   Social History:   reports that he quit smoking about 6 years ago. His smoking use included cigarettes. He has a 20.50 pack-year smoking history. He has never used smokeless tobacco. He reports that he does not currently use alcohol after a past usage of about 3.0 standard drinks of alcohol per week. He reports that he does not use drugs.  No Known Allergies  Family History  Problem Relation Age of Onset   Alcohol abuse Mother    Liver disease Mother    Cirrhosis Mother    Heart attack Mother     Prior to Admission medications   Medication Sig Start Date End Date Taking? Authorizing Provider  acetaminophen (TYLENOL) 500 MG tablet Take 1,500 mg by mouth 3 (three) times daily as needed for mild pain, moderate pain, fever or headache.   Yes [provider]  allopurinol (ZYLOPRIM) 100 MG tablet Take 1 tablet (100 mg total) by mouth daily. 11/07/22  Yes Ivonne Andrew, NP  AURYXIA 1 GM 210 MG(Fe) tablet Take 210-420 mg by mouth See admin instructions. 420 mg with every meal, 210 mg with each snack. 02/11/21  Yes [provider]  cinacalcet (SENSIPAR) 30 MG tablet Take 1 tablet (30 mg total) by mouth Every Tuesday,Thursday,and Saturday with dialysis. 06/12/22  Yes Shahmehdi, Seyed A, MD  diphenhydramine-acetaminophen (TYLENOL PM) 25-500 MG TABS tablet Take 2 tablets by mouth at bedtime as needed (sleep/pain). 09/26/22  Yes Ivonne Andrew, NP  folic acid (FOLVITE) 1 MG tablet TAKE 1 TABLET BY MOUTH EVERY DAY 11/01/22  Yes Massie Maroon, FNP  hydroxyurea (HYDREA) 500 MG capsule Take 1 capsule (500 mg total) by mouth daily. 06/01/22  Yes Jegede, [name], MD  Iron-Vitamins (GERITOL PO) Take 15 mLs by mouth daily.   Yes [provider]  lactulose (CHRONULAC) 10 GM/15ML solution Take 30 mLs (20 g total) by mouth 2 (two) times daily as needed for mild constipation. 10/23/22  Yes Danford, Earl Lites, MD  lidocaine-prilocaine (EMLA) cream Apply 1 application. topically Every  Tuesday,Thursday,and Saturday with dialysis. 09/08/19  Yes [provider]  oxyCODONE (OXYCONTIN) 10 mg 12 hr tablet Take 1 tablet (10 mg total) by mouth every 12 (twelve) hours. 11/02/22  Yes Massie Maroon, FNP  Oxycodone HCl 10 MG TABS Take 1 tablet (10 mg total) by mouth every 6 (six) hours as needed. 11/16/22  Yes Massie Maroon, FNP  pantoprazole (PROTONIX) 40 MG tablet Take 1 tablet (40 mg total) by mouth 2 (two) times daily before a meal. 10/30/22  Yes Massie Maroon, FNP  tiZANidine (ZANAFLEX) 4 MG tablet TAKE 1/2 TABLET (2MG  TOTAL) BY MOUTH EVERY 8 HOURS AS NEEDED FOR MUSCLE SPASM 11/14/22  Yes Massie Maroon, FNP  Torsemide 40 MG TABS Take 40 mg by mouth in the morning and at bedtime. 09/26/22 11/20/22 Yes Ivonne Andrew, NP    Physical Exam: Vitals:   11/20/22 1500 11/20/22 1515 11/20/22 1530 11/20/22 1545  BP:      Pulse: (!) 128 (!) 113 (!) 112 (!) 120  Resp:      Temp:      TempSrc:      SpO2: 96% 96% 96% 94%  Weight:  Height:        Constitutional: Elderly male who appears to be in some discomfort Eyes: PERRL, poor visual acuity ENMT: Mucous membranes are moist. Posterior pharynx clear of any exudate or lesions.  Neck: normal, supple, no masses, no thyromegaly Respiratory: clear to auscultation bilaterally, no wheezing, no crackles. Normal respiratory effort.   Cardiovascular: Irregular irregular.  No extremity edema.  Fistula present of the right upper extremity. Abdomen: Generalized tenderness to palpation on abdomen worse on the right quadrants. Musculoskeletal: no clubbing / cyanosis. No joint deformity upper and lower extremities. Good ROM, no contractures. Normal muscle tone.  Skin: no rashes, lesions, ulcers. No induration Neurologic: CN 2-12 grossly intact.  Able to move all remedies. Psychiatric: Normal judgment and insight. Alert and oriented x 3. Normal mood.   Data Reviewed:  EKG revealed atrial fibrillation 105 bpm with RVR.  Reviewed  labs, imaging, and pertinent records as noted above in HPI.  Assessment and Plan: Symptomatic anemia secondary to GI bleed Patient presents with hemoglobin down to 6.3 with positive guaiac stools.  Hemoglobin was noted to be 7.9 on 5/14 after last discharge from the hospital for symptomatic anemia thought to possible GI bleed.  Patient just recently underwent EGD with Dr. Rhea Belton on 5/6 which noted normal esophagus, 2 cm hiatal hernia, acute gastritis with ulcerations and erosions (biopsies taken negative for H. Pylori), and normal duodenum. -Admit to a medical telemetry bed -Monitor intake and output -Clear liquid diet -Continue with transfusion of 1 unit of packed red blood cells -Recheck H&H and transfuse blood products as needed to keep hemoglobin greater than 7 -Protonix 40 mg twice daily -[facility] consulted, will follow-up for further recommendations  Sickle cell disease Acute on chronic pain  Patient reports having severe pain in his arms and legs which feels like pain with sickle cell crisis. -Continue hydroxyurea, ferric citrate -Continue current pain regimen with Dilaudid IV as needed for uncontrolled pain  ESRD on HD Patient on TTS schedule.  He had received dialysis today. -Consider need to formally consult nephrology if requiring prolonged stay.  Persistent atrial fibrillation Patient not on anticoagulation at baseline due to history of GI bleed.  Normally not requiring rate controlling medications. -Continue to monitor  Systolic congestive heart failure Chronic.  Patient appears to be euvolemic at this time.  Echocardiogram noted EF to be 35 to 40% with indeterminate diastolic parameters back in 05/2022. -Fluid management with dialysis.  COPD On physical exam patient without significant wheezing. -Albuterol nebs as needed for shortness of breath/wheezing  Gout -Continue allopurinol  DVT prophylaxis: SCDs Advance Care Planning:   Code Status: DNR    Consults:  GI  Family Communication: Attempted to update patient's Cousin [name] over the phone and left a message  Severity of Illness: The appropriate patient status for this patient is OBSERVATION. Observation status is judged to be reasonable and necessary in order to provide the required intensity of service to ensure the patient's safety. The patient's presenting symptoms, physical exam findings, and initial radiographic and laboratory data in the context of their medical condition is felt to place them at decreased risk for further clinical deterioration. Furthermore, it is anticipated that the patient will be medically stable for discharge from the hospital within 2 midnights of admission.   Author: Clydie Braun, MD 11/20/2022 4:27 PM  For on call review [email or web address].

## 2022-11-20 NOTE — ED Provider Notes (Signed)
Patrick [name]   CSN: [id number] Arrival date & time: 11/20/22  1028     History  Chief Complaint  Patient presents with   Pain   Chest Pain   Abdominal Pain    Patrick [name] is a 67 y.o. male.  Pt is a 67 yo male with pmhx significant for afib (no longer on thinners due to gib), cirrhosis, esrd on hd (Tu, Th, Sat), CHF, sickle cell SS disease, copd, and thrombocytopenia.  Pt said he has pain all over.  He did go to dialysis today and sx worsened.  He denies any fevers.              Home Medications Prior to Admission medications   Medication Sig Start Date End Date Taking? Authorizing Provider  acetaminophen (TYLENOL) 500 MG tablet Take 1,500 mg by mouth 3 (three) times daily as needed for mild pain, moderate pain, fever or headache.   Yes [provider]  allopurinol (ZYLOPRIM) 100 MG tablet Take 1 tablet (100 mg total) by mouth daily. 11/07/22  Yes Ivonne Andrew, NP  AURYXIA 1 GM 210 MG(Fe) tablet Take 210-420 mg by mouth See admin instructions. 420 mg with every meal, 210 mg with each snack. 02/11/21  Yes [provider]  cinacalcet (SENSIPAR) 30 MG tablet Take 1 tablet (30 mg total) by mouth Every Tuesday,Thursday,and Saturday with dialysis. 06/12/22  Yes Shahmehdi, Seyed A, MD  diphenhydramine-acetaminophen (TYLENOL PM) 25-500 MG TABS tablet Take 2 tablets by mouth at bedtime as needed (sleep/pain). 09/26/22  Yes Ivonne Andrew, NP  folic acid (FOLVITE) 1 MG tablet TAKE 1 TABLET BY MOUTH EVERY DAY 11/01/22  Yes Massie Maroon, FNP  hydroxyurea (HYDREA) 500 MG capsule Take 1 capsule (500 mg total) by mouth daily. 06/01/22  Yes Jegede, [name], MD  Iron-Vitamins (GERITOL PO) Take 15 mLs by mouth daily.   Yes [provider]  lactulose (CHRONULAC) 10 GM/15ML solution Take 30 mLs (20 g total) by mouth 2 (two) times daily as needed for mild constipation. 10/23/22  Yes Danford,  Earl Lites, MD  lidocaine-prilocaine (EMLA) cream Apply 1 application. topically Every Tuesday,Thursday,and Saturday with dialysis. 09/08/19  Yes [provider]  oxyCODONE (OXYCONTIN) 10 mg 12 hr tablet Take 1 tablet (10 mg total) by mouth every 12 (twelve) hours. 11/02/22  Yes Massie Maroon, FNP  Oxycodone HCl 10 MG TABS Take 1 tablet (10 mg total) by mouth every 6 (six) hours as needed. 11/16/22  Yes Massie Maroon, FNP  pantoprazole (PROTONIX) 40 MG tablet Take 1 tablet (40 mg total) by mouth 2 (two) times daily before a meal. 10/30/22  Yes Massie Maroon, FNP  tiZANidine (ZANAFLEX) 4 MG tablet TAKE 1/2 TABLET (2MG  TOTAL) BY MOUTH EVERY 8 HOURS AS NEEDED FOR MUSCLE SPASM 11/14/22  Yes Massie Maroon, FNP  Torsemide 40 MG TABS Take 40 mg by mouth in the morning and at bedtime. 09/26/22 11/20/22 Yes Ivonne Andrew, NP      Allergies    Patient has no known allergies.    Review of Systems   Review of Systems  Musculoskeletal:  Positive for arthralgias.       Diffuse pain  All other systems reviewed and are negative.   Physical Exam Updated Vital Signs BP 106/73   Pulse (!) 120   Temp 98.9 F (37.2 C) (Oral)   Resp 20   Ht 5\' 11"  (1.803 m)  Wt 63 kg   SpO2 94%   BMI 19.37 kg/m  Physical Exam Vitals and nursing [name] reviewed.  Constitutional:      Appearance: He is well-developed.  HENT:     Head: Normocephalic and atraumatic.  Eyes:     Extraocular Movements: Extraocular movements intact.  Cardiovascular:     Rate and Rhythm: Normal rate and regular rhythm.     Heart sounds: Normal heart sounds.  Pulmonary:     Effort: Pulmonary effort is normal.     Breath sounds: Normal breath sounds.  Abdominal:     General: Bowel sounds are normal.     Palpations: Abdomen is soft.  Genitourinary:    Rectum: Guaiac result positive.  Musculoskeletal:        General: Normal range of motion.     Cervical back: Normal range of motion and neck supple.      Comments: + AVF right arm with good thrill  Skin:    General: Skin is warm.     Capillary Refill: Capillary refill takes less than 2 seconds.  Neurological:     General: No focal deficit present.     Mental Status: He is alert and oriented to person, place, and time.  Psychiatric:        Mood and Affect: Mood normal.        Behavior: Behavior normal.     ED Results / Procedures / Treatments   Labs (all labs ordered are listed, but only abnormal results are displayed) Labs Reviewed  BASIC METABOLIC PANEL - Abnormal; Notable for the following components:      Result Value   Potassium 3.3 (*)    Chloride 92 (*)    BUN 45 (*)    Creatinine, Ser 5.63 (*)    Calcium 8.8 (*)    GFR, Estimated 11 (*)    Anion gap 17 (*)    All other components within normal limits  CBC - Abnormal; Notable for the following components:   RBC 1.64 (*)    Hemoglobin 6.3 (*)    HCT 17.1 (*)    MCV 104.3 (*)    MCH 38.4 (*)    MCHC 36.8 (*)    RDW 23.1 (*)    Platelets 118 (*)    nRBC 9.9 (*)    All other components within normal limits  RETICULOCYTES - Abnormal; Notable for the following components:   Retic Ct Pct 10.3 (*)    RBC. 1.63 (*)    Immature Retic Fract 25.1 (*)    All other components within normal limits  HEPATIC FUNCTION PANEL - Abnormal; Notable for the following components:   AST 61 (*)    Alkaline Phosphatase 230 (*)    Total Bilirubin 2.2 (*)    Bilirubin, Direct 1.0 (*)    Indirect Bilirubin 1.2 (*)    All other components within normal limits  POC OCCULT BLOOD, ED - Abnormal; Notable for the following components:   Fecal Occult Bld POSITIVE (*)    All other components within normal limits  TROPONIN I (HIGH SENSITIVITY) - Abnormal; Notable for the following components:   Troponin I (High Sensitivity) 42 (*)    All other components within normal limits  TROPONIN I (HIGH SENSITIVITY) - Abnormal; Notable for the following components:   Troponin I (High Sensitivity) 41 (*)     All other components within normal limits  LIPASE, BLOOD  PREPARE RBC (CROSSMATCH)  TYPE AND SCREEN    EKG EKG Interpretation  Date/Time:  Tuesday November 20 2022 11:40:05 EDT Ventricular Rate:  105 PR Interval:    QRS Duration: 88 QT Interval:  350 QTC Calculation: 462 R Axis:   116 Text Interpretation: Atrial fibrillation with rapid ventricular response Possible Right ventricular hypertrophy ST & T wave abnormality, consider inferior ischemia Abnormal ECG When compared with ECG of 20-Oct-2022 16:11, PREVIOUS ECG IS PRESENT Since last tracing rate faster Confirmed by Jacalyn Lefevre [phone]) on 11/20/2022 12:58:00 PM  Radiology DG Chest 2 View  Result Date: 11/20/2022 CLINICAL DATA:  Chest pain. EXAM: CHEST - 2 VIEW COMPARISON:  10/20/2022. FINDINGS: Stable chronic interstitial prominence. No consolidation or pulmonary edema. Stable cardiomegaly and mediastinal contours. No pleural effusion or pneumothorax. Visualized bones and upper abdomen are unremarkable. IMPRESSION: No evidence of acute cardiopulmonary disease. Electronically Signed   By: Orvan Falconer M.D.   On: 11/20/2022 13:07    Procedures Procedures    Medications Ordered in ED Medications  0.45 % sodium chloride infusion ( Intravenous New Bag/Given 11/20/22 1341)  HYDROmorphone (DILAUDID) injection 2 mg (has no administration in time range)  ondansetron (ZOFRAN) injection 4 mg (has no administration in time range)  0.9 %  sodium chloride infusion (Manually program via Guardrails IV Fluids) (has no administration in time range)  HYDROmorphone (DILAUDID) injection 2 mg (2 mg Intravenous Given 11/20/22 1336)  HYDROmorphone (DILAUDID) injection 2 mg (2 mg Intravenous Given 11/20/22 1611)    ED Course/ Medical Decision Making/ A&P                             Medical Decision Making Amount and/or Complexity of Data Reviewed Labs: ordered. Radiology: ordered.  Risk Prescription drug management. Decision regarding  hospitalization.   This patient presents to the ED for concern of weakness, this involves an extensive number of treatment options, and is a complaint that carries with it a high risk of complications and morbidity.  The differential diagnosis includes sickle cell crisis, gi bleed, electrolyte abn,    Co morbidities that complicate the patient evaluation  afib (no longer on thinners due to gib), cirrhosis, esrd on hd (Tu, Th, Sat), CHF, sickle cell SS disease, copd, and thrombocytopenia   Additional history obtained:  Additional history obtained from epic chart review  Lab Tests:  I Ordered, and personally interpreted labs.  The pertinent results include:  cbc 6.3 (hgb 7.9 on 5/14); cmp with bun 45 and cr 5.63; trop 42, lfts with tb elevated at 2.2 (chronic)   Imaging Studies ordered:  I ordered imaging studies including cxr  I independently visualized and interpreted imaging which showed No evidence of acute cardiopulmonary disease.  I agree with the radiologist interpretation   Cardiac Monitoring:  The patient was maintained on a cardiac monitor.  I personally viewed and interpreted the cardiac monitored which showed an underlying rhythm of: nsr   Medicines ordered and prescription drug management:  I ordered medication including ivfs and dilaudid  for sx  Reevaluation of the patient after these medicines showed that the patient improved I have reviewed the patients home medicines and have made adjustments as needed  Critical Interventions:  transfusion   Consultations Obtained:  I requested consultation with the hospitalist (Dr. Arlyss Queen),  and discussed lab and imaging findings as well as pertinent plan - he will admit   Problem List / ED Course:  Symptomatic anemia:  pt given 1 unit prbc .  Pt is guaiac +, but EGD  nl a month ago, so I did not order ppi.   ESRD on HD:  pt had HD today.  His CXR was clear.  His K is ok.     Reevaluation:  After the  interventions noted above, I reevaluated the patient and found that they have :improved   Social Determinants of Health:  Lives at home   Dispostion:  After consideration of the diagnostic results and the patients response to treatment, I feel that the patent would benefit from admission.    CRITICAL CARE Performed by: Jacalyn Lefevre   Total critical care time: 30 minutes  Critical care time was exclusive of separately billable procedures and treating other patients.  Critical care was necessary to treat or prevent imminent or life-threatening deterioration.  Critical care was time spent personally by me on the following activities: development of treatment plan with patient and/or surrogate as well as nursing, discussions with consultants, evaluation of patient's response to treatment, examination of patient, obtaining history from patient or surrogate, ordering and performing treatments and interventions, ordering and review of laboratory studies, ordering and review of radiographic studies, pulse oximetry and re-evaluation of patient's condition.         Final Clinical Impression(s) / ED Diagnoses Final diagnoses:  Symptomatic anemia  ESRD on hemodialysis (HCC)  Hemoglobin SS disease with crisis [facility])    Rx / DC Orders ED Discharge Orders     None         Jacalyn Lefevre, MD 11/20/22 1623

## 2022-11-20 NOTE — ED Notes (Signed)
Phlebotomy unable to obtain blood.  

## 2022-11-20 NOTE — ED Notes (Signed)
ED TO INPATIENT HANDOFF REPORT  ED Nurse Name and Phone #: Nicholos Johns [phone]  S Name/Age/Gender Patrick [name] 67 y.o. male Room/Bed: 025C/025C  Code Status   Code Status: DNR  Home/SNF/Other Home Patient oriented to: self, place, time, and situation Is this baseline? Yes   Triage Complete: Triage complete  Chief Complaint Symptomatic anemia [D64.9]  Triage Note Pt with chronic pain and generalized bodyaches. Today felt worse than normal after dialysis. Pt slumped over in wheelchair. Vague description of symptoms given. Pt awake, oriented, x4.    Allergies No Known Allergies  Level of Care/Admitting Diagnosis ED Disposition     ED Disposition  Admit   Condition  --   Comment  Hospital Area: [facility] [[id number]]  Level of Care: Telemetry Medical [104]  May place patient in observation at [facility] or [facility] if equivalent level of care is available:: No  Covid Evaluation: Asymptomatic - no recent exposure (last 10 days) testing not required  Diagnosis: Symptomatic anemia [[id number]]  Admitting Physician: Clydie Braun [[id number]]  Attending Physician: Clydie Braun [[id number]]          B Medical/Surgery History Past Medical History:  Diagnosis Date   A-fib (HCC)    Blood dyscrasia    Blood transfusion    "I've had a bunch of them"   CHF (congestive heart failure) (HCC)    followed by hf clinic   Chronic kidney disease    Stage 5, dialysis on Tu/Th/Sa   Dialysis patient (HCC) 04/2019   AV fistula (right arm), TTS   Dysrhythmia    A-Fib per patient   Early satiety    Elevated LFTs 04/01/2022   Elevated troponin 04/01/2022   Empyema of gallbladder 05/27/2021   GERD (gastroesophageal reflux disease)    Gout    Legally blind    Metabolic acidosis with increased anion gap and accumulation of organic acids 04/01/2022   Mitral regurgitation    Muscle tightness-Right arm  05/27/2014   Pneumonia    Pulmonary hypertension  (HCC)    Shortness of breath dyspnea    Sickle cell disease, type SS (HCC)    Swelling abdomen    Swelling of extremity, left    Swelling of extremity, right    Tricuspid regurgitation    Past Surgical History:  Procedure Laterality Date   A/V FISTULAGRAM Right 07/29/2020   Procedure: A/V FISTULAGRAM - Right Arm;  Surgeon: Chuck Hint, MD;  Location: [facility] INVASIVE CV LAB;  Service: Cardiovascular;  Laterality: Right;   BASCILIC VEIN TRANSPOSITION Right 04/12/2014   Procedure: BASILIC VEIN TRANSPOSITION;  Surgeon: Sherren Kerns, MD;  Location: [facility] OR;  Service: Vascular;  Laterality: Right;   BILIARY STENT PLACEMENT  04/04/2022   Procedure: BILIARY STENT PLACEMENT;  Surgeon: Meryl Dare, MD;  Location: [facility] ENDOSCOPY;  Service: Gastroenterology;;   BIOPSY  08/04/2020   Procedure: BIOPSY;  Surgeon: Meryl Dare, MD;  Location: [facility] ENDOSCOPY;  Service: Gastroenterology;;   BIOPSY  06/28/2021   Procedure: BIOPSY;  Surgeon: Beverley Fiedler, MD;  Location: [facility] ENDOSCOPY;  Service: Gastroenterology;;   BIOPSY  04/02/2022   Procedure: BIOPSY;  Surgeon: Lemar Lofty., MD;  Location: [facility] ENDOSCOPY;  Service: Gastroenterology;;   BIOPSY  10/22/2022   Procedure: BIOPSY;  Surgeon: Beverley Fiedler, MD;  Location: [facility] ENDOSCOPY;  Service: Gastroenterology;;   BOWEL RESECTION N/A 05/24/2021   Procedure: SMALL BOWEL RESECTION;  Surgeon: Emelia Loron, MD;  Location: [facility] OR;  Service:  General;  Laterality: N/A;   CARDIOVERSION N/A 06/05/2018   Procedure: CARDIOVERSION;  Surgeon: Dolores Patty, MD;  Location: [facility] ENDOSCOPY;  Service: Cardiovascular;  Laterality: N/A;   CARDIOVERSION N/A 10/06/2019   Procedure: CARDIOVERSION;  Surgeon: Dolores Patty, MD;  Location: [facility] ENDOSCOPY;  Service: Cardiovascular;  Laterality: N/A;   CHOLECYSTECTOMY N/A 05/24/2021   Procedure: OPEN CHOLECYSTECTOMY;  Surgeon: Emelia Loron, MD;  Location: [facility] OR;  Service: General;  Laterality:  N/A;   COLONOSCOPY WITH PROPOFOL N/A 09/24/2017   Procedure: COLONOSCOPY WITH PROPOFOL;  Surgeon: Lynann Bologna, MD;  Location: [facility] ENDOSCOPY;  Service: Endoscopy;  Laterality: N/A;   ENDOSCOPIC RETROGRADE CHOLANGIOPANCREATOGRAPHY (ERCP) WITH PROPOFOL N/A 05/17/2022   Procedure: ENDOSCOPIC RETROGRADE CHOLANGIOPANCREATOGRAPHY (ERCP) WITH PROPOFOL;  Surgeon: Meridee Score Netty Starring., MD;  Location: [facility] ENDOSCOPY;  Service: Gastroenterology;  Laterality: N/A;   ERCP N/A 04/04/2022   Procedure: ENDOSCOPIC RETROGRADE CHOLANGIOPANCREATOGRAPHY (ERCP);  Surgeon: Meryl Dare, MD;  Location: [facility] ENDOSCOPY;  Service: Gastroenterology;  Laterality: N/A;   ESOPHAGOGASTRODUODENOSCOPY N/A 09/19/2017   Procedure: ESOPHAGOGASTRODUODENOSCOPY (EGD);  Surgeon: Lynann Bologna, MD;  Location: [facility] ENDOSCOPY;  Service: Endoscopy;  Laterality: N/A;   ESOPHAGOGASTRODUODENOSCOPY N/A 05/29/2021   Procedure: ESOPHAGOGASTRODUODENOSCOPY (EGD);  Surgeon: Rachael Fee, MD;  Location: [facility] ENDOSCOPY;  Service: Gastroenterology;  Laterality: N/A;   ESOPHAGOGASTRODUODENOSCOPY (EGD) WITH PROPOFOL N/A 08/04/2020   Procedure: ESOPHAGOGASTRODUODENOSCOPY (EGD) WITH PROPOFOL;  Surgeon: Meryl Dare, MD;  Location: [facility] ENDOSCOPY;  Service: Gastroenterology;  Laterality: N/A;   ESOPHAGOGASTRODUODENOSCOPY (EGD) WITH PROPOFOL N/A 06/28/2021   Procedure: ESOPHAGOGASTRODUODENOSCOPY (EGD) WITH PROPOFOL;  Surgeon: Beverley Fiedler, MD;  Location: [facility] ENDOSCOPY;  Service: Gastroenterology;  Laterality: N/A;   ESOPHAGOGASTRODUODENOSCOPY (EGD) WITH PROPOFOL N/A 08/10/2021   Procedure: ESOPHAGOGASTRODUODENOSCOPY (EGD) WITH PROPOFOL;  Surgeon: Lynann Bologna, MD;  Location: [facility] ENDOSCOPY;  Service: Gastroenterology;  Laterality: N/A;   ESOPHAGOGASTRODUODENOSCOPY (EGD) WITH PROPOFOL N/A 08/02/2021   Procedure: ESOPHAGOGASTRODUODENOSCOPY (EGD) WITH PROPOFOL;  Surgeon: Shellia Cleverly, DO;  Location: [facility] ENDOSCOPY;  Service: Gastroenterology;  Laterality:  N/A;   ESOPHAGOGASTRODUODENOSCOPY (EGD) WITH PROPOFOL N/A 04/02/2022   Procedure: ESOPHAGOGASTRODUODENOSCOPY (EGD) WITH PROPOFOL;  Surgeon: Meridee Score Netty Starring., MD;  Location: [facility] ENDOSCOPY;  Service: Gastroenterology;  Laterality: N/A;   ESOPHAGOGASTRODUODENOSCOPY (EGD) WITH PROPOFOL N/A 05/17/2022   Procedure: ESOPHAGOGASTRODUODENOSCOPY (EGD) WITH PROPOFOL;  Surgeon: Meridee Score Netty Starring., MD;  Location: [facility] ENDOSCOPY;  Service: Gastroenterology;  Laterality: N/A;   ESOPHAGOGASTRODUODENOSCOPY (EGD) WITH PROPOFOL N/A 10/22/2022   Procedure: ESOPHAGOGASTRODUODENOSCOPY (EGD) WITH PROPOFOL;  Surgeon: Beverley Fiedler, MD;  Location: [facility] ENDOSCOPY;  Service: Gastroenterology;  Laterality: N/A;   EUS N/A 05/17/2022   Procedure: UPPER ENDOSCOPIC ULTRASOUND (EUS) RADIAL;  Surgeon: Lemar Lofty., MD;  Location: [facility] ENDOSCOPY;  Service: Gastroenterology;  Laterality: N/A;   FINE NEEDLE ASPIRATION  04/02/2022   Procedure: FINE NEEDLE ASPIRATION (FNA) LINEAR;  Surgeon: Lemar Lofty., MD;  Location: [facility] ENDOSCOPY;  Service: Gastroenterology;;   FINE NEEDLE ASPIRATION N/A 05/17/2022   Procedure: FINE NEEDLE ASPIRATION (FNA) LINEAR;  Surgeon: Lemar Lofty., MD;  Location: [facility] ENDOSCOPY;  Service: Gastroenterology;  Laterality: N/A;   HEMOSTASIS CLIP PLACEMENT  05/29/2021   Procedure: HEMOSTASIS CLIP PLACEMENT;  Surgeon: Rachael Fee, MD;  Location: [facility] ENDOSCOPY;  Service: Gastroenterology;;   HOT HEMOSTASIS N/A 05/29/2021   Procedure: HOT HEMOSTASIS (ARGON PLASMA COAGULATION/BICAP);  Surgeon: Rachael Fee, MD;  Location: [facility] ENDOSCOPY;  Service: Gastroenterology;  Laterality: N/A;   HOT HEMOSTASIS N/A 06/28/2021   Procedure: HOT HEMOSTASIS (ARGON PLASMA COAGULATION/BICAP);  Surgeon: Rhea Belton,  Carie Caddy, MD;  Location: [facility] ENDOSCOPY;  Service: Gastroenterology;  Laterality: N/A;   HOT HEMOSTASIS N/A 08/10/2021   Procedure: HOT HEMOSTASIS (ARGON PLASMA COAGULATION/BICAP);  Surgeon:  Lynann Bologna, MD;  Location: [facility] ENDOSCOPY;  Service: Gastroenterology;  Laterality: N/A;   HOT HEMOSTASIS N/A 08/02/2021   Procedure: HOT HEMOSTASIS (ARGON PLASMA COAGULATION/BICAP);  Surgeon: Shellia Cleverly, DO;  Location: [facility] ENDOSCOPY;  Service: Gastroenterology;  Laterality: N/A;   IR PARACENTESIS  07/04/2022   IR PERC TUN PERIT CATH WO PORT S&I /IMAG  05/23/2021   IR REMOVAL TUN CV CATH W/O FL  06/10/2021   IR US GUIDE VASC ACCESS RIGHT  05/23/2021   LAPAROSCOPY N/A 05/24/2021   Procedure: LAPAROSCOPY DIAGNOSTIC;  Surgeon: Emelia Loron, MD;  Location: [facility] OR;  Service: General;  Laterality: N/A;   LAPAROTOMY N/A 05/24/2021   Procedure: EXPLORATORY LAPAROTOMY;  Surgeon: Emelia Loron, MD;  Location: [facility] OR;  Service: General;  Laterality: N/A;   LEFT AND RIGHT HEART CATHETERIZATION WITH CORONARY ANGIOGRAM N/A 06/11/2011   Procedure: LEFT AND RIGHT HEART CATHETERIZATION WITH CORONARY ANGIOGRAM;  Surgeon: Laurey Morale, MD;  Location: [facility] CATH LAB;  Service: Cardiovascular;  Laterality: N/A;   PERIPHERAL VASCULAR BALLOON ANGIOPLASTY Right 07/29/2020   Procedure: PERIPHERAL VASCULAR BALLOON ANGIOPLASTY;  Surgeon: Chuck Hint, MD;  Location: [facility] INVASIVE CV LAB;  Service: Cardiovascular;  Laterality: Right;  arm fistula   REMOVAL OF STONES  05/17/2022   Procedure: REMOVAL OF STONES;  Surgeon: Meridee Score Netty Starring., MD;  Location: [facility] ENDOSCOPY;  Service: Gastroenterology;;   REVISON OF ARTERIOVENOUS FISTULA Right 08/08/2020   Procedure: ANEURYSM EXCISION OF RIGHT UPPER EXTREMITY ARTERIOVENOUS FISTULA;  Surgeon: Chuck Hint, MD;  Location: [facility] OR;  Service: Vascular;  Laterality: Right;   REVISON OF ARTERIOVENOUS FISTULA Right 03/21/2022   Procedure: EXCISION OF ULCERATED SKIN OF RIGHT ARTERIOVENOUS FISTULA REVISION;  Surgeon: Cephus Shelling, MD;  Location: [facility] OR;  Service: Vascular;  Laterality: Right;   REVISON OF ARTERIOVENOUS FISTULA Right 07/02/2022    Procedure: EXCISION OF ULCERS RIGHT ARTERIOVENOUS FISTULA;  Surgeon: Chuck Hint, MD;  Location: [facility] OR;  Service: Vascular;  Laterality: Right;   SCLEROTHERAPY  05/29/2021   Procedure: Susa Day;  Surgeon: Rachael Fee, MD;  Location: [facility] ENDOSCOPY;  Service: Gastroenterology;;   Dennison Mascot  04/04/2022   Procedure: SPHINCTEROTOMY;  Surgeon: Meryl Dare, MD;  Location: [facility] ENDOSCOPY;  Service: Gastroenterology;;   Francine Graven REMOVAL  05/29/2021   Procedure: STENT REMOVAL;  Surgeon: Rachael Fee, MD;  Location: [facility] ENDOSCOPY;  Service: Gastroenterology;;   Francine Graven REMOVAL  05/17/2022   Procedure: STENT REMOVAL;  Surgeon: Lemar Lofty., MD;  Location: [facility] ENDOSCOPY;  Service: Gastroenterology;;   TEE WITHOUT CARDIOVERSION N/A 12/27/2015   Procedure: TRANSESOPHAGEAL ECHOCARDIOGRAM (TEE);  Surgeon: Quintella Reichert, MD;  Location: [facility] ENDOSCOPY;  Service: Cardiovascular;  Laterality: N/A;   UPPER ESOPHAGEAL ENDOSCOPIC ULTRASOUND (EUS) N/A 04/02/2022   Procedure: UPPER ESOPHAGEAL ENDOSCOPIC ULTRASOUND (EUS);  Surgeon: Lemar Lofty., MD;  Location: [facility] ENDOSCOPY;  Service: Gastroenterology;  Laterality: N/A;     A IV Location/Drains/Wounds Patient Lines/Drains/Airways Status     Active Line/Drains/Airways     Name Placement date Placement time Site Days   Peripheral IV 11/20/22 20 G Left;Posterior Forearm 11/20/22  1330  Forearm  less than 1   Fistula / Graft Right Upper arm Arteriovenous fistula 08/01/21  0354  Upper arm  476            Intake/Output Last  24 hours No intake or output data in the 24 hours ending 11/20/22 1851  Labs/Imaging Results for orders placed or performed during the hospital encounter of 11/20/22 (from the past 48 hour(s))  Reticulocytes     Status: Abnormal   Collection Time: 11/20/22  1:29 PM  Result Value Ref Range   Retic Ct Pct 10.3 (H) 0.4 - 3.1 %    Comment: CONFIRMED BY MANUAL DILUTION   RBC. 1.63 (L) 4.22 - 5.81  MIL/uL   Retic Count, Absolute 169.0 19.0 - 186.0 K/uL    Comment: CONFIRMED BY MANUAL DILUTION   Immature Retic Fract 25.1 (H) 2.3 - 15.9 %    Comment: Performed at [facility], [address] [place], [place] [address]  Troponin I (High Sensitivity)     Status: Abnormal   Collection Time: 11/20/22  1:30 PM  Result Value Ref Range   Troponin I (High Sensitivity) 41 (H) <18 ng/L    Comment: (NOTE) Elevated high sensitivity troponin I (hsTnI) values and significant  changes across serial measurements may suggest ACS but many other  chronic and acute conditions are known to elevate hsTnI results.  Refer to the "Links" section for chest pain algorithms and additional  guidance. Performed at [facility], [address] [place], [place] [address]   Hepatic function panel     Status: Abnormal   Collection Time: 11/20/22  1:30 PM  Result Value Ref Range   Total Protein 7.9 6.5 - 8.1 g/dL   Albumin 3.6 3.5 - 5.0 g/dL   AST 61 (H) 15 - 41 U/L   ALT 40 0 - 44 U/L   Alkaline Phosphatase 230 (H) 38 - 126 U/L   Total Bilirubin 2.2 (H) 0.3 - 1.2 mg/dL   Bilirubin, Direct 1.0 (H) 0.0 - 0.2 mg/dL   Indirect Bilirubin 1.2 (H) 0.3 - 0.9 mg/dL    Comment: Performed at [facility] Lab, [address] [place], [place] [address]  Basic metabolic panel     Status: Abnormal   Collection Time: 11/20/22  1:40 PM  Result Value Ref Range   Sodium 138 135 - 145 mmol/L   Potassium 3.3 (L) 3.5 - 5.1 mmol/L   Chloride 92 (L) 98 - 111 mmol/L   CO2 29 22 - 32 mmol/L   Glucose, Bld 89 70 - 99 mg/dL    Comment: Glucose reference range applies only to samples taken after fasting for at least 8 hours.   BUN 45 (H) 8 - 23 mg/dL   Creatinine, Ser 5.73 (H) 0.61 - 1.24 mg/dL   Calcium 8.8 (L) 8.9 - 10.3 mg/dL   GFR, Estimated 11 (L) >60 mL/min    Comment: (NOTE) Calculated using the CKD-EPI Creatinine Equation (2021)    Anion gap 17 (H) 5 - 15    Comment: Performed at [facility]  Lab, [address], [place], [place] [address]  CBC     Status: Abnormal   Collection Time: 11/20/22  1:40 PM  Result Value Ref Range   WBC 6.6 4.0 - 10.5 K/uL   RBC 1.64 (L) 4.22 - 5.81 MIL/uL   Hemoglobin 6.3 (LL) 13.0 - 17.0 g/dL    Comment: REPEATED TO VERIFY THIS CRITICAL RESULT HAS VERIFIED AND BEEN CALLED TO Adelee Hannula,RN BY ZELDA BEECH ON 06 04 2024 AT 1437, AND HAS BEEN READ BACK.     HCT 17.1 (L) 39.0 - 52.0 %   MCV 104.3 (H) 80.0 - 100.0  fL   MCH 38.4 (H) 26.0 - 34.0 pg   MCHC 36.8 (H) 30.0 - 36.0 g/dL   RDW 78.2 (H) 95.6 - 21.3 %   Platelets 118 (L) 150 - 400 K/uL    Comment: REPEATED TO VERIFY   nRBC 9.9 (H) 0.0 - 0.2 %    Comment: Performed at [facility], [address], [place], [place] [address]  Troponin I (High Sensitivity)     Status: Abnormal   Collection Time: 11/20/22  1:40 PM  Result Value Ref Range   Troponin I (High Sensitivity) 42 (H) <18 ng/L    Comment: (NOTE) Elevated high sensitivity troponin I (hsTnI) values and significant  changes across serial measurements may suggest ACS but many other  chronic and acute conditions are known to elevate hsTnI results.  Refer to the "Links" section for chest pain algorithms and additional  guidance. Performed at [facility], [address], [place], [place] [address]   Lipase, blood     Status: None   Collection Time: 11/20/22  2:08 PM  Result Value Ref Range   Lipase 33 11 - 51 U/L    Comment: Performed at [facility] Lab, [address] [place], [place] [address]  POC occult blood, ED     Status: Abnormal   Collection Time: 11/20/22  2:53 PM  Result Value Ref Range   Fecal Occult Bld POSITIVE (A) NEGATIVE  Prepare RBC (crossmatch)     Status: None   Collection Time: 11/20/22  4:00 PM  Result Value Ref Range   Order Confirmation      ORDER PROCESSED BY BLOOD BANK Performed at [facility], [address], [place], [place] [address]   Type and screen [facility]      Status: None (Preliminary result)   Collection Time: 11/20/22  4:00 PM  Result Value Ref Range   ABO/RH(D) AB POS    Antibody Screen NEG    Sample Expiration 11/23/2022,2359    Unit Number [id number]    Blood Component Type RBC LR PHER2    Unit division 00    Status of Unit ISSUED    Donor AG Type      NEGATIVE FOR C ANTIGEN NEGATIVE FOR E ANTIGEN NEGATIVE FOR KELL ANTIGEN   Transfusion Status OK TO TRANSFUSE    Crossmatch Result      Compatible Performed at [facility], [address] [place], [place] [address]    *Note: Due to a large number of results and/or encounters for the requested time period, some results have not been displayed. A complete set of results can be found in Results Review.   DG Chest 2 View  Result Date: 11/20/2022 CLINICAL DATA:  Chest pain. EXAM: CHEST - 2 VIEW COMPARISON:  10/20/2022. FINDINGS: Stable chronic interstitial prominence. No consolidation or pulmonary edema. Stable cardiomegaly and mediastinal contours. No pleural effusion or pneumothorax. Visualized bones and upper abdomen are unremarkable. IMPRESSION: No evidence of acute cardiopulmonary disease. Electronically Signed   By: Orvan Falconer M.D.   On: 11/20/2022 13:07    Pending Labs Unresulted Labs (From admission, onward)     Start     Ordered   11/21/22 0500  CBC  Tomorrow morning,   R        11/20/22 1715   11/21/22 0500  Basic metabolic panel  Tomorrow morning,   R        11/20/22 1715  Vitals/Pain Today's Vitals   11/20/22 1631 11/20/22 1644 11/20/22 1825 11/20/22 1846  BP: 101/74  100/68 105/76  Pulse: (!) 117  (!) 120 (!) 129  Resp: 18  18 16   Temp: 98.6 F (37 C)  98 F (36.7 C) 98.2 F (36.8 C)  TempSrc: Oral  Oral Oral  SpO2: 96%  97% 95%  Weight:      Height:      PainSc:  6       Isolation Precautions No active isolations  Medications Medications  0.45 % sodium chloride infusion ( Intravenous New Bag/Given 11/20/22 1341)   HYDROmorphone (DILAUDID) injection 2 mg (has no administration in time range)  ondansetron (ZOFRAN) injection 4 mg (has no administration in time range)  0.9 %  sodium chloride infusion (Manually program via Guardrails IV Fluids) (has no administration in time range)  oxyCODONE (OXYCONTIN) 12 hr tablet 10 mg (has no administration in time range)  oxyCODONE (Oxy IR/ROXICODONE) immediate release tablet 10 mg (10 mg Oral Given 11/20/22 1837)  lactulose (CHRONULAC) 10 GM/15ML solution 20 g (has no administration in time range)  pantoprazole (PROTONIX) EC tablet 40 mg (has no administration in time range)  folic acid (FOLVITE) tablet 1 mg (has no administration in time range)  lidocaine-prilocaine (EMLA) cream 1 Application (has no administration in time range)  sodium chloride flush (NS) 0.9 % injection 3 mL (has no administration in time range)  acetaminophen (TYLENOL) tablet 650 mg (has no administration in time range)    Or  acetaminophen (TYLENOL) suppository 650 mg (has no administration in time range)  albuterol (PROVENTIL) (2.5 MG/3ML) 0.083% nebulizer solution 2.5 mg (has no administration in time range)  HYDROmorphone (DILAUDID) injection 0.5 mg (has no administration in time range)  ferric citrate (AURYXIA) tablet 420 mg (has no administration in time range)    And  ferric citrate (AURYXIA) tablet 210 mg (has no administration in time range)  HYDROmorphone (DILAUDID) injection 2 mg (2 mg Intravenous Given 11/20/22 1336)  HYDROmorphone (DILAUDID) injection 2 mg (2 mg Intravenous Given 11/20/22 1611)    Mobility walks     Focused Assessments Cardiac Assessment Handoff:    Lab Results  Component Value Date   CKTOTAL 110 07/24/2022   CKMB 1.7 10/18/2011   TROPONINI <0.03 06/05/2018   Lab Results  Component Value Date   DDIMER >20.00 (H) 05/29/2021   Does the Patient currently have chest pain? No    R Recommendations: See Admitting Provider Note  Report given to:    Additional Notes: Blood transfusion stated 1830

## 2022-11-20 NOTE — ED Triage Notes (Signed)
Pt with chronic pain and generalized bodyaches. Today felt worse than normal after dialysis. Pt slumped over in wheelchair. Vague description of symptoms given. Pt awake, oriented, x4.

## 2022-11-21 DIAGNOSIS — Z79899 Other long term (current) drug therapy: Secondary | ICD-10-CM | POA: Diagnosis not present

## 2022-11-21 DIAGNOSIS — H548 Legal blindness, as defined in USA: Secondary | ICD-10-CM | POA: Diagnosis not present

## 2022-11-21 DIAGNOSIS — I4891 Unspecified atrial fibrillation: Secondary | ICD-10-CM | POA: Diagnosis not present

## 2022-11-21 DIAGNOSIS — K922 Gastrointestinal hemorrhage, unspecified: Secondary | ICD-10-CM | POA: Diagnosis not present

## 2022-11-21 DIAGNOSIS — I5022 Chronic systolic (congestive) heart failure: Secondary | ICD-10-CM | POA: Diagnosis not present

## 2022-11-21 DIAGNOSIS — Z8249 Family history of ischemic heart disease and other diseases of the circulatory system: Secondary | ICD-10-CM | POA: Diagnosis not present

## 2022-11-21 DIAGNOSIS — R195 Other fecal abnormalities: Secondary | ICD-10-CM

## 2022-11-21 DIAGNOSIS — R188 Other ascites: Secondary | ICD-10-CM | POA: Diagnosis not present

## 2022-11-21 DIAGNOSIS — Z66 Do not resuscitate: Secondary | ICD-10-CM | POA: Diagnosis not present

## 2022-11-21 DIAGNOSIS — R079 Chest pain, unspecified: Secondary | ICD-10-CM | POA: Diagnosis not present

## 2022-11-21 DIAGNOSIS — I081 Rheumatic disorders of both mitral and tricuspid valves: Secondary | ICD-10-CM | POA: Diagnosis not present

## 2022-11-21 DIAGNOSIS — D631 Anemia in chronic kidney disease: Secondary | ICD-10-CM | POA: Diagnosis not present

## 2022-11-21 DIAGNOSIS — I272 Pulmonary hypertension, unspecified: Secondary | ICD-10-CM | POA: Diagnosis not present

## 2022-11-21 DIAGNOSIS — R109 Unspecified abdominal pain: Secondary | ICD-10-CM

## 2022-11-21 DIAGNOSIS — I4821 Permanent atrial fibrillation: Secondary | ICD-10-CM | POA: Diagnosis not present

## 2022-11-21 DIAGNOSIS — D696 Thrombocytopenia, unspecified: Secondary | ICD-10-CM | POA: Diagnosis not present

## 2022-11-21 DIAGNOSIS — E8889 Other specified metabolic disorders: Secondary | ICD-10-CM | POA: Diagnosis not present

## 2022-11-21 DIAGNOSIS — D5700 Hb-SS disease with crisis, unspecified: Secondary | ICD-10-CM | POA: Diagnosis not present

## 2022-11-21 DIAGNOSIS — N2581 Secondary hyperparathyroidism of renal origin: Secondary | ICD-10-CM | POA: Diagnosis not present

## 2022-11-21 DIAGNOSIS — D649 Anemia, unspecified: Secondary | ICD-10-CM | POA: Diagnosis not present

## 2022-11-21 DIAGNOSIS — N250 Renal osteodystrophy: Secondary | ICD-10-CM | POA: Diagnosis not present

## 2022-11-21 DIAGNOSIS — K254 Chronic or unspecified gastric ulcer with hemorrhage: Secondary | ICD-10-CM | POA: Diagnosis not present

## 2022-11-21 DIAGNOSIS — D571 Sickle-cell disease without crisis: Secondary | ICD-10-CM | POA: Diagnosis not present

## 2022-11-21 DIAGNOSIS — N186 End stage renal disease: Secondary | ICD-10-CM | POA: Diagnosis not present

## 2022-11-21 DIAGNOSIS — I132 Hypertensive heart and chronic kidney disease with heart failure and with stage 5 chronic kidney disease, or end stage renal disease: Secondary | ICD-10-CM | POA: Diagnosis not present

## 2022-11-21 DIAGNOSIS — I959 Hypotension, unspecified: Secondary | ICD-10-CM | POA: Diagnosis not present

## 2022-11-21 DIAGNOSIS — Z992 Dependence on renal dialysis: Secondary | ICD-10-CM | POA: Diagnosis not present

## 2022-11-21 DIAGNOSIS — G894 Chronic pain syndrome: Secondary | ICD-10-CM | POA: Diagnosis not present

## 2022-11-21 DIAGNOSIS — K7460 Unspecified cirrhosis of liver: Secondary | ICD-10-CM | POA: Diagnosis not present

## 2022-11-21 DIAGNOSIS — K449 Diaphragmatic hernia without obstruction or gangrene: Secondary | ICD-10-CM | POA: Diagnosis not present

## 2022-11-21 DIAGNOSIS — Z87891 Personal history of nicotine dependence: Secondary | ICD-10-CM | POA: Diagnosis not present

## 2022-11-21 DIAGNOSIS — M109 Gout, unspecified: Secondary | ICD-10-CM | POA: Diagnosis not present

## 2022-11-21 DIAGNOSIS — I120 Hypertensive chronic kidney disease with stage 5 chronic kidney disease or end stage renal disease: Secondary | ICD-10-CM | POA: Diagnosis not present

## 2022-11-21 DIAGNOSIS — J449 Chronic obstructive pulmonary disease, unspecified: Secondary | ICD-10-CM | POA: Diagnosis not present

## 2022-11-21 DIAGNOSIS — N281 Cyst of kidney, acquired: Secondary | ICD-10-CM | POA: Diagnosis not present

## 2022-11-21 HISTORY — DX: Hb-SS disease with crisis, unspecified: D57.00

## 2022-11-21 LAB — BASIC METABOLIC PANEL
Anion gap: 16 — ABNORMAL HIGH (ref 5–15)
BUN: 51 mg/dL — ABNORMAL HIGH (ref 8–23)
CO2: 26 mmol/L (ref 22–32)
Calcium: 7.8 mg/dL — ABNORMAL LOW (ref 8.9–10.3)
Chloride: 92 mmol/L — ABNORMAL LOW (ref 98–111)
Creatinine, Ser: 6.68 mg/dL — ABNORMAL HIGH (ref 0.61–1.24)
GFR, Estimated: 9 mL/min — ABNORMAL LOW (ref >60)
Glucose, Bld: 88 mg/dL (ref 70–99)
Potassium: 4.3 mmol/L (ref 3.5–5.1)
Sodium: 134 mmol/L — ABNORMAL LOW (ref 135–145)

## 2022-11-21 LAB — TYPE AND SCREEN
ABO/RH(D): AB POS
Unit division: 0

## 2022-11-21 LAB — HEPATIC FUNCTION PANEL
ALT: 45 U/L — ABNORMAL HIGH (ref 0–44)
AST: 62 U/L — ABNORMAL HIGH (ref 15–41)
Albumin: 3.4 g/dL — ABNORMAL LOW (ref 3.5–5.0)
Alkaline Phosphatase: 194 U/L — ABNORMAL HIGH (ref 38–126)
Bilirubin, Direct: 0.8 mg/dL — ABNORMAL HIGH (ref 0.0–0.2)
Indirect Bilirubin: 1.1 mg/dL — ABNORMAL HIGH (ref 0.3–0.9)
Total Bilirubin: 1.9 mg/dL — ABNORMAL HIGH (ref 0.3–1.2)
Total Protein: 7.8 g/dL (ref 6.5–8.1)

## 2022-11-21 LAB — CBC
HCT: 16.6 % — ABNORMAL LOW (ref 39.0–52.0)
HCT: 19.2 % — ABNORMAL LOW (ref 39.0–52.0)
Hemoglobin: 6.2 g/dL — CL (ref 13.0–17.0)
Hemoglobin: 7.1 g/dL — ABNORMAL LOW (ref 13.0–17.0)
MCH: 37.8 pg — ABNORMAL HIGH (ref 26.0–34.0)
MCH: 38.2 pg — ABNORMAL HIGH (ref 26.0–34.0)
MCHC: 37.3 g/dL — ABNORMAL HIGH (ref 30.0–36.0)
MCHC: 37 g/dL — ABNORMAL HIGH (ref 30.0–36.0)
MCV: 101.2 fL — ABNORMAL HIGH (ref 80.0–100.0)
MCV: 103.2 fL — ABNORMAL HIGH (ref 80.0–100.0)
Platelets: 107 10*3/uL — ABNORMAL LOW (ref 150–400)
Platelets: 78 10*3/uL — ABNORMAL LOW (ref 150–400)
RBC: 1.64 MIL/uL — ABNORMAL LOW (ref 4.22–5.81)
RBC: 1.86 MIL/uL — ABNORMAL LOW (ref 4.22–5.81)
RDW: 24 % — ABNORMAL HIGH (ref 11.5–15.5)
RDW: 23.8 % — ABNORMAL HIGH (ref 11.5–15.5)
WBC: 5.3 10*3/uL (ref 4.0–10.5)
WBC: 5.3 10*3/uL (ref 4.0–10.5)
nRBC: 17.5 % — ABNORMAL HIGH (ref 0.0–0.2)
nRBC: 20.6 % — ABNORMAL HIGH (ref 0.0–0.2)

## 2022-11-21 LAB — BPAM RBC
Blood Product Expiration Date: 202406192359
Unit Type and Rh: 600

## 2022-11-21 LAB — HEPATITIS B SURFACE ANTIGEN: Hepatitis B Surface Ag: NONREACTIVE

## 2022-11-21 LAB — LACTATE DEHYDROGENASE: LDH: 196 U/L — ABNORMAL HIGH (ref 98–192)

## 2022-11-21 LAB — PREPARE RBC (CROSSMATCH)

## 2022-11-21 MED ORDER — ACETAMINOPHEN 325 MG PO TABS: 650.0000 mg | ORAL_TABLET | Freq: Once | ORAL | Status: DC

## 2022-11-21 MED ORDER — DOXERCALCIFEROL 4 MCG/2ML IV SOLN
7.0000 ug | INTRAVENOUS | Status: DC
  Administered 2022-11-22 – 2022-11-27 (×3): 7 ug via INTRAVENOUS
  Filled 2022-11-21 (×5): qty 4

## 2022-11-21 MED ORDER — DIPHENHYDRAMINE HCL 50 MG/ML IJ SOLN: 12.5000 mg | Freq: Four times a day (QID) | INTRAMUSCULAR | Status: DC | PRN

## 2022-11-21 MED ORDER — DIPHENHYDRAMINE HCL 50 MG/ML IJ SOLN: 12.5000 mg | Freq: Once | INTRAMUSCULAR | Status: DC

## 2022-11-21 MED ORDER — DIPHENHYDRAMINE HCL 12.5 MG/5ML PO ELIX: 12.5000 mg | ORAL_SOLUTION | Freq: Four times a day (QID) | ORAL | Status: DC | PRN

## 2022-11-21 MED ORDER — TORSEMIDE 20 MG PO TABS
40.0000 mg | ORAL_TABLET | Freq: Two times a day (BID) | ORAL | Status: DC
  Administered 2022-11-21 – 2022-11-27 (×11): 40 mg via ORAL
  Filled 2022-11-21 (×13): qty 2

## 2022-11-21 MED ORDER — SODIUM CHLORIDE 0.9% FLUSH: 9.0000 mL | INTRAVENOUS | Status: DC | PRN

## 2022-11-21 MED ORDER — SODIUM CHLORIDE 0.9% IV SOLUTION: Freq: Once | INTRAVENOUS | Status: DC

## 2022-11-21 MED ORDER — HYDROMORPHONE 1 MG/ML IV SOLN
INTRAVENOUS | Status: DC
  Administered 2022-11-21: 30 mg via INTRAVENOUS
  Filled 2022-11-21: qty 30

## 2022-11-21 MED ORDER — NALOXONE HCL 0.4 MG/ML IJ SOLN: 0.4000 mg | INTRAMUSCULAR | Status: DC | PRN

## 2022-11-21 MED ORDER — CINACALCET HCL 30 MG PO TABS
30.0000 mg | ORAL_TABLET | ORAL | Status: DC
  Administered 2022-11-22 – 2022-11-27 (×3): 30 mg via ORAL
  Filled 2022-11-21 (×4): qty 1

## 2022-11-21 NOTE — Consult Note (Signed)
[facility] Renal Consultation Note    Indication for Consultation:  Management of ESRD/hemodialysis; anemia, hypertension/volume and secondary hyperparathyroidism PCP:  HPI: Patrick [name] is a 67 y.o. male with ESRD on hemodialysis T,Th,S at [facility]. PMH: Sickle Cell disease, legally blind, HFeEF, Afib not on AC DT GIB, GID 2/2 angiodysplasia, cirrhosis, anemia, SHPT. Last HD 11/20/2022. He left 2.4 kg above OP EDW. Variable compliance with HD. Last hospitalization 05/04-05/12/2022 for symptomatic anemia.   Patient presented to ED 11/20/2022 with C/O worsening pain. Labs unremarkable for ESRD-low K+ noted but labs drawn immediately post HD. HGB 6.4. CXR unremarkable. He was admitted as observation patient for symptomatic anemia. Seen by GI today who is of the opinion that symptoms are more compatible with Sickle Cell Crisis. No endoscopic W/U planned.   Seen in room, says he just hurts all over. He does have JVD, trace BLE edema. He denies SOB. O2 sats 99% on RA. Discussed with Sickle Cell NP holding off transfusion until HD in AM D/T concern for development of pulmonary edema. Will order HD first shift.   Past Medical History:  Diagnosis Date   A-fib (HCC)    Blood dyscrasia    Blood transfusion    "I've had a bunch of them"   CHF (congestive heart failure) (HCC)    followed by hf clinic   Chronic kidney disease    Stage 5, dialysis on Tu/Th/Sa   Dialysis patient (HCC) 04/2019   AV fistula (right arm), TTS   Dysrhythmia    A-Fib per patient   Early satiety    Elevated LFTs 04/01/2022   Elevated troponin 04/01/2022   Empyema of gallbladder 05/27/2021   GERD (gastroesophageal reflux disease)    Gout    Legally blind    Metabolic acidosis with increased anion gap and accumulation of organic acids 04/01/2022   Mitral regurgitation    Muscle tightness-Right arm  05/27/2014   Pneumonia    Pulmonary hypertension (HCC)    Shortness of  breath dyspnea    Sickle cell disease, type SS (HCC)    Swelling abdomen    Swelling of extremity, left    Swelling of extremity, right    Tricuspid regurgitation    Past Surgical History:  Procedure Laterality Date   A/V FISTULAGRAM Right 07/29/2020   Procedure: A/V FISTULAGRAM - Right Arm;  Surgeon: Chuck Hint, MD;  Location: [facility] INVASIVE CV LAB;  Service: Cardiovascular;  Laterality: Right;   BASCILIC VEIN TRANSPOSITION Right 04/12/2014   Procedure: BASILIC VEIN TRANSPOSITION;  Surgeon: Sherren Kerns, MD;  Location: [facility] OR;  Service: Vascular;  Laterality: Right;   BILIARY STENT PLACEMENT  04/04/2022   Procedure: BILIARY STENT PLACEMENT;  Surgeon: Meryl Dare, MD;  Location: [facility] ENDOSCOPY;  Service: Gastroenterology;;   BIOPSY  08/04/2020   Procedure: BIOPSY;  Surgeon: Meryl Dare, MD;  Location: [facility] ENDOSCOPY;  Service: Gastroenterology;;   BIOPSY  06/28/2021   Procedure: BIOPSY;  Surgeon: Beverley Fiedler, MD;  Location: [facility] ENDOSCOPY;  Service: Gastroenterology;;   BIOPSY  04/02/2022   Procedure: BIOPSY;  Surgeon: Lemar Lofty., MD;  Location: [facility] ENDOSCOPY;  Service: Gastroenterology;;   BIOPSY  10/22/2022   Procedure: BIOPSY;  Surgeon: Beverley Fiedler, MD;  Location: [facility] ENDOSCOPY;  Service: Gastroenterology;;   BOWEL RESECTION N/A 05/24/2021   Procedure: SMALL BOWEL RESECTION;  Surgeon: Emelia Loron, MD;  Location: [facility] OR;  Service: General;  Laterality: N/A;   CARDIOVERSION N/A 06/05/2018  Procedure: CARDIOVERSION;  Surgeon: Dolores Patty, MD;  Location: [facility] ENDOSCOPY;  Service: Cardiovascular;  Laterality: N/A;   CARDIOVERSION N/A 10/06/2019   Procedure: CARDIOVERSION;  Surgeon: Dolores Patty, MD;  Location: [facility] ENDOSCOPY;  Service: Cardiovascular;  Laterality: N/A;   CHOLECYSTECTOMY N/A 05/24/2021   Procedure: OPEN CHOLECYSTECTOMY;  Surgeon: Emelia Loron, MD;  Location: [facility] OR;  Service: General;  Laterality: N/A;   COLONOSCOPY WITH  PROPOFOL N/A 09/24/2017   Procedure: COLONOSCOPY WITH PROPOFOL;  Surgeon: Lynann Bologna, MD;  Location: [facility] ENDOSCOPY;  Service: Endoscopy;  Laterality: N/A;   ENDOSCOPIC RETROGRADE CHOLANGIOPANCREATOGRAPHY (ERCP) WITH PROPOFOL N/A 05/17/2022   Procedure: ENDOSCOPIC RETROGRADE CHOLANGIOPANCREATOGRAPHY (ERCP) WITH PROPOFOL;  Surgeon: Meridee Score Netty Starring., MD;  Location: [facility] ENDOSCOPY;  Service: Gastroenterology;  Laterality: N/A;   ERCP N/A 04/04/2022   Procedure: ENDOSCOPIC RETROGRADE CHOLANGIOPANCREATOGRAPHY (ERCP);  Surgeon: Meryl Dare, MD;  Location: [facility] ENDOSCOPY;  Service: Gastroenterology;  Laterality: N/A;   ESOPHAGOGASTRODUODENOSCOPY N/A 09/19/2017   Procedure: ESOPHAGOGASTRODUODENOSCOPY (EGD);  Surgeon: Lynann Bologna, MD;  Location: [facility] ENDOSCOPY;  Service: Endoscopy;  Laterality: N/A;   ESOPHAGOGASTRODUODENOSCOPY N/A 05/29/2021   Procedure: ESOPHAGOGASTRODUODENOSCOPY (EGD);  Surgeon: Rachael Fee, MD;  Location: [facility] ENDOSCOPY;  Service: Gastroenterology;  Laterality: N/A;   ESOPHAGOGASTRODUODENOSCOPY (EGD) WITH PROPOFOL N/A 08/04/2020   Procedure: ESOPHAGOGASTRODUODENOSCOPY (EGD) WITH PROPOFOL;  Surgeon: Meryl Dare, MD;  Location: [facility] ENDOSCOPY;  Service: Gastroenterology;  Laterality: N/A;   ESOPHAGOGASTRODUODENOSCOPY (EGD) WITH PROPOFOL N/A 06/28/2021   Procedure: ESOPHAGOGASTRODUODENOSCOPY (EGD) WITH PROPOFOL;  Surgeon: Beverley Fiedler, MD;  Location: [facility] ENDOSCOPY;  Service: Gastroenterology;  Laterality: N/A;   ESOPHAGOGASTRODUODENOSCOPY (EGD) WITH PROPOFOL N/A 08/10/2021   Procedure: ESOPHAGOGASTRODUODENOSCOPY (EGD) WITH PROPOFOL;  Surgeon: Lynann Bologna, MD;  Location: [facility] ENDOSCOPY;  Service: Gastroenterology;  Laterality: N/A;   ESOPHAGOGASTRODUODENOSCOPY (EGD) WITH PROPOFOL N/A 08/02/2021   Procedure: ESOPHAGOGASTRODUODENOSCOPY (EGD) WITH PROPOFOL;  Surgeon: Shellia Cleverly, DO;  Location: [facility] ENDOSCOPY;  Service: Gastroenterology;  Laterality: N/A;    ESOPHAGOGASTRODUODENOSCOPY (EGD) WITH PROPOFOL N/A 04/02/2022   Procedure: ESOPHAGOGASTRODUODENOSCOPY (EGD) WITH PROPOFOL;  Surgeon: Meridee Score Netty Starring., MD;  Location: [facility] ENDOSCOPY;  Service: Gastroenterology;  Laterality: N/A;   ESOPHAGOGASTRODUODENOSCOPY (EGD) WITH PROPOFOL N/A 05/17/2022   Procedure: ESOPHAGOGASTRODUODENOSCOPY (EGD) WITH PROPOFOL;  Surgeon: Meridee Score Netty Starring., MD;  Location: [facility] ENDOSCOPY;  Service: Gastroenterology;  Laterality: N/A;   ESOPHAGOGASTRODUODENOSCOPY (EGD) WITH PROPOFOL N/A 10/22/2022   Procedure: ESOPHAGOGASTRODUODENOSCOPY (EGD) WITH PROPOFOL;  Surgeon: Beverley Fiedler, MD;  Location: [facility] ENDOSCOPY;  Service: Gastroenterology;  Laterality: N/A;   EUS N/A 05/17/2022   Procedure: UPPER ENDOSCOPIC ULTRASOUND (EUS) RADIAL;  Surgeon: Lemar Lofty., MD;  Location: [facility] ENDOSCOPY;  Service: Gastroenterology;  Laterality: N/A;   FINE NEEDLE ASPIRATION  04/02/2022   Procedure: FINE NEEDLE ASPIRATION (FNA) LINEAR;  Surgeon: Lemar Lofty., MD;  Location: [facility] ENDOSCOPY;  Service: Gastroenterology;;   FINE NEEDLE ASPIRATION N/A 05/17/2022   Procedure: FINE NEEDLE ASPIRATION (FNA) LINEAR;  Surgeon: Lemar Lofty., MD;  Location: [facility] ENDOSCOPY;  Service: Gastroenterology;  Laterality: N/A;   HEMOSTASIS CLIP PLACEMENT  05/29/2021   Procedure: HEMOSTASIS CLIP PLACEMENT;  Surgeon: Rachael Fee, MD;  Location: [facility] ENDOSCOPY;  Service: Gastroenterology;;   HOT HEMOSTASIS N/A 05/29/2021   Procedure: HOT HEMOSTASIS (ARGON PLASMA COAGULATION/BICAP);  Surgeon: Rachael Fee, MD;  Location: [facility] ENDOSCOPY;  Service: Gastroenterology;  Laterality: N/A;   HOT HEMOSTASIS N/A 06/28/2021   Procedure: HOT HEMOSTASIS (ARGON PLASMA COAGULATION/BICAP);  Surgeon: Beverley Fiedler, MD;  Location: [facility] ENDOSCOPY;  Service: Gastroenterology;  Laterality: N/A;   HOT HEMOSTASIS N/A 08/10/2021   Procedure: HOT HEMOSTASIS (ARGON PLASMA COAGULATION/BICAP);  Surgeon: Lynann Bologna, MD;  Location: [facility] ENDOSCOPY;  Service: Gastroenterology;  Laterality: N/A;   HOT HEMOSTASIS N/A 08/02/2021   Procedure: HOT HEMOSTASIS (ARGON PLASMA COAGULATION/BICAP);  Surgeon: Shellia Cleverly, DO;  Location: [facility] ENDOSCOPY;  Service: Gastroenterology;  Laterality: N/A;   IR PARACENTESIS  07/04/2022   IR PERC TUN PERIT CATH WO PORT S&I /IMAG  05/23/2021   IR REMOVAL TUN CV CATH W/O FL  06/10/2021   IR US GUIDE VASC ACCESS RIGHT  05/23/2021   LAPAROSCOPY N/A 05/24/2021   Procedure: LAPAROSCOPY DIAGNOSTIC;  Surgeon: Emelia Loron, MD;  Location: [facility] OR;  Service: General;  Laterality: N/A;   LAPAROTOMY N/A 05/24/2021   Procedure: EXPLORATORY LAPAROTOMY;  Surgeon: Emelia Loron, MD;  Location: [facility] OR;  Service: General;  Laterality: N/A;   LEFT AND RIGHT HEART CATHETERIZATION WITH CORONARY ANGIOGRAM N/A 06/11/2011   Procedure: LEFT AND RIGHT HEART CATHETERIZATION WITH CORONARY ANGIOGRAM;  Surgeon: Laurey Morale, MD;  Location: [facility] CATH LAB;  Service: Cardiovascular;  Laterality: N/A;   PERIPHERAL VASCULAR BALLOON ANGIOPLASTY Right 07/29/2020   Procedure: PERIPHERAL VASCULAR BALLOON ANGIOPLASTY;  Surgeon: Chuck Hint, MD;  Location: [facility] INVASIVE CV LAB;  Service: Cardiovascular;  Laterality: Right;  arm fistula   REMOVAL OF STONES  05/17/2022   Procedure: REMOVAL OF STONES;  Surgeon: Meridee Score Netty Starring., MD;  Location: [facility] ENDOSCOPY;  Service: Gastroenterology;;   REVISON OF ARTERIOVENOUS FISTULA Right 08/08/2020   Procedure: ANEURYSM EXCISION OF RIGHT UPPER EXTREMITY ARTERIOVENOUS FISTULA;  Surgeon: Chuck Hint, MD;  Location: [facility] OR;  Service: Vascular;  Laterality: Right;   REVISON OF ARTERIOVENOUS FISTULA Right 03/21/2022   Procedure: EXCISION OF ULCERATED SKIN OF RIGHT ARTERIOVENOUS FISTULA REVISION;  Surgeon: Cephus Shelling, MD;  Location: [facility] OR;  Service: Vascular;  Laterality: Right;   REVISON OF ARTERIOVENOUS FISTULA Right 07/02/2022    Procedure: EXCISION OF ULCERS RIGHT ARTERIOVENOUS FISTULA;  Surgeon: Chuck Hint, MD;  Location: [facility] OR;  Service: Vascular;  Laterality: Right;   SCLEROTHERAPY  05/29/2021   Procedure: Susa Day;  Surgeon: Rachael Fee, MD;  Location: [facility] ENDOSCOPY;  Service: Gastroenterology;;   Dennison Mascot  04/04/2022   Procedure: SPHINCTEROTOMY;  Surgeon: Meryl Dare, MD;  Location: [facility] ENDOSCOPY;  Service: Gastroenterology;;   Francine Graven REMOVAL  05/29/2021   Procedure: STENT REMOVAL;  Surgeon: Rachael Fee, MD;  Location: [facility] ENDOSCOPY;  Service: Gastroenterology;;   Francine Graven REMOVAL  05/17/2022   Procedure: STENT REMOVAL;  Surgeon: Lemar Lofty., MD;  Location: [facility] ENDOSCOPY;  Service: Gastroenterology;;   TEE WITHOUT CARDIOVERSION N/A 12/27/2015   Procedure: TRANSESOPHAGEAL ECHOCARDIOGRAM (TEE);  Surgeon: Quintella Reichert, MD;  Location: [facility] ENDOSCOPY;  Service: Cardiovascular;  Laterality: N/A;   UPPER ESOPHAGEAL ENDOSCOPIC ULTRASOUND (EUS) N/A 04/02/2022   Procedure: UPPER ESOPHAGEAL ENDOSCOPIC ULTRASOUND (EUS);  Surgeon: Lemar Lofty., MD;  Location: [facility] ENDOSCOPY;  Service: Gastroenterology;  Laterality: N/A;   Family History  Problem Relation Age of Onset   Alcohol abuse Mother    Liver disease Mother    Cirrhosis Mother    Heart attack Mother    Social History:  reports that he quit smoking about 6 years ago. His smoking use included cigarettes. He has a 20.50 pack-year smoking history. He has never used smokeless tobacco. He reports that he does not currently use alcohol after a past usage of about 3.0 standard drinks of alcohol per week.  He reports that he does not use drugs. No Known Allergies Prior to Admission medications   Medication Sig Start Date End Date Taking? Authorizing Provider  acetaminophen (TYLENOL) 500 MG tablet Take 1,500 mg by mouth 3 (three) times daily as needed for mild pain, moderate pain, fever or headache.   Yes [provider]  allopurinol (ZYLOPRIM) 100 MG tablet Take 1 tablet (100 mg total) by mouth daily. 11/07/22  Yes Ivonne Andrew, NP  AURYXIA 1 GM 210 MG(Fe) tablet Take 210-420 mg by mouth See admin instructions. 420 mg with every meal, 210 mg with each snack. 02/11/21  Yes [provider]  cinacalcet (SENSIPAR) 30 MG tablet Take 1 tablet (30 mg total) by mouth Every Tuesday,Thursday,and Saturday with dialysis. 06/12/22  Yes Shahmehdi, Seyed A, MD  diphenhydramine-acetaminophen (TYLENOL PM) 25-500 MG TABS tablet Take 2 tablets by mouth at bedtime as needed (sleep/pain). 09/26/22  Yes Ivonne Andrew, NP  folic acid (FOLVITE) 1 MG tablet TAKE 1 TABLET BY MOUTH EVERY DAY 11/01/22  Yes Massie Maroon, FNP  hydroxyurea (HYDREA) 500 MG capsule Take 1 capsule (500 mg total) by mouth daily. 06/01/22  Yes Jegede, [name], MD  Iron-Vitamins (GERITOL PO) Take 15 mLs by mouth daily.   Yes [provider]  lactulose (CHRONULAC) 10 GM/15ML solution Take 30 mLs (20 g total) by mouth 2 (two) times daily as needed for mild constipation. 10/23/22  Yes Danford, Earl Lites, MD  lidocaine-prilocaine (EMLA) cream Apply 1 application. topically Every Tuesday,Thursday,and Saturday with dialysis. 09/08/19  Yes [provider]  oxyCODONE (OXYCONTIN) 10 mg 12 hr tablet Take 1 tablet (10 mg total) by mouth every 12 (twelve) hours. 11/02/22  Yes Massie Maroon, FNP  Oxycodone HCl 10 MG TABS Take 1 tablet (10 mg total) by mouth every 6 (six) hours as needed. 11/16/22  Yes Massie Maroon, FNP  pantoprazole (PROTONIX) 40 MG tablet Take 1 tablet (40 mg total) by mouth 2 (two) times daily before a meal. 10/30/22  Yes Massie Maroon, FNP  tiZANidine (ZANAFLEX) 4 MG tablet TAKE 1/2 TABLET (2MG  TOTAL) BY MOUTH EVERY 8 HOURS AS NEEDED FOR MUSCLE SPASM 11/14/22  Yes Massie Maroon, FNP  Torsemide 40 MG TABS Take 40 mg by mouth in the morning and at bedtime. 09/26/22 11/20/22 Yes Ivonne Andrew, NP   Current  Facility-Administered Medications  Medication Dose Route Frequency Provider Last Rate Last Admin   0.45 % sodium chloride infusion   Intravenous Continuous Madelyn Flavors A, MD 50 mL/hr at 11/21/22 0830 Rate Change at 11/21/22 0830   0.9 %  sodium chloride infusion (Manually program via Guardrails IV Fluids)   Intravenous Once Jacalyn Lefevre, MD       0.9 %  sodium chloride infusion (Manually program via Guardrails IV Fluids)   Intravenous Once Massie Maroon, FNP       acetaminophen (TYLENOL) tablet 650 mg  650 mg Oral Q6H PRN Clydie Braun, MD       Or   acetaminophen (TYLENOL) suppository 650 mg  650 mg Rectal Q6H PRN Clydie Braun, MD       acetaminophen (TYLENOL) tablet 650 mg  650 mg Oral Once Massie Maroon, FNP       albuterol (PROVENTIL) (2.5 MG/3ML) 0.083% nebulizer solution 2.5 mg  2.5 mg Nebulization Q6H PRN Smith, Rondell A, MD       diphenhydrAMINE (BENADRYL) injection 12.5 mg  12.5 mg Intravenous Once Massie Maroon,  FNP       ferric citrate (AURYXIA) tablet 420 mg  420 mg Oral TID WC Smith, Rondell A, MD   420 mg at 11/21/22 1130   And   ferric citrate (AURYXIA) tablet 210 mg  210 mg Oral With snacks Katrinka Blazing, Rondell A, MD   210 mg at 11/20/22 2142   folic acid (FOLVITE) tablet 1 mg  1 mg Oral Daily Katrinka Blazing, Rondell A, MD   1 mg at 11/21/22 4098   HYDROmorphone (DILAUDID) injection 0.5 mg  0.5 mg Intravenous Q3H PRN Madelyn Flavors A, MD   0.5 mg at 11/21/22 1129   lactulose (CHRONULAC) 10 GM/15ML solution 20 g  20 g Oral BID PRN Clydie Braun, MD       [START ON 11/22/2022] lidocaine-prilocaine (EMLA) cream 1 Application  1 Application Topical Q T,Th,Sa-HD Smith, Rondell A, MD       ondansetron (ZOFRAN) injection 4 mg  4 mg Intravenous Q4H PRN Jacalyn Lefevre, MD       oxyCODONE (Oxy IR/ROXICODONE) immediate release tablet 10 mg  10 mg Oral Q6H PRN Madelyn Flavors A, MD   10 mg at 11/20/22 1837   oxyCODONE (OXYCONTIN) 12 hr tablet 10 mg  10 mg Oral Q12H Smith, Rondell  A, MD   10 mg at 11/21/22 0826   pantoprazole (PROTONIX) EC tablet 40 mg  40 mg Oral BID AC Smith, Rondell A, MD   40 mg at 11/21/22 0826   sodium chloride flush (NS) 0.9 % injection 3 mL  3 mL Intravenous Q12H Madelyn Flavors A, MD   3 mL at 11/21/22 0826   Labs: Basic Metabolic Panel: Recent Labs  Lab 11/20/22 1340 11/21/22 0430  NA 138 134*  K 3.3* 4.3  CL 92* 92*  CO2 29 26  GLUCOSE 89 88  BUN 45* 51*  CREATININE 5.63* 6.68*  CALCIUM 8.8* 7.8*   Liver Function Tests: Recent Labs  Lab 11/20/22 1330  AST 61*  ALT 40  ALKPHOS 230*  BILITOT 2.2*  PROT 7.9  ALBUMIN 3.6   Recent Labs  Lab 11/20/22 1408  LIPASE 33   No results for input(s): "AMMONIA" in the last 168 hours. CBC: Recent Labs  Lab 11/20/22 1340 11/21/22 0430  WBC 6.6 5.3  HGB 6.3* 6.2*  HCT 17.1* 16.6*  MCV 104.3* 101.2*  PLT 118* 107*   Cardiac Enzymes: No results for input(s): "CKTOTAL", "CKMB", "CKMBINDEX", "TROPONINI" in the last 168 hours. CBG: No results for input(s): "GLUCAP" in the last 168 hours. Iron Studies: No results for input(s): "IRON", "TIBC", "TRANSFERRIN", "FERRITIN" in the last 72 hours. Studies/Results: DG Chest 2 View  Result Date: 11/20/2022 CLINICAL DATA:  Chest pain. EXAM: CHEST - 2 VIEW COMPARISON:  10/20/2022. FINDINGS: Stable chronic interstitial prominence. No consolidation or pulmonary edema. Stable cardiomegaly and mediastinal contours. No pleural effusion or pneumothorax. Visualized bones and upper abdomen are unremarkable. IMPRESSION: No evidence of acute cardiopulmonary disease. Electronically Signed   By: Orvan Falconer M.D.   On: 11/20/2022 13:07    ROS: As per HPI otherwise negative.   Physical Exam: Vitals:   11/20/22 1958 11/20/22 2105 11/21/22 0433 11/21/22 0829  BP: 105/71 93/62 110/65 110/68  Pulse: 99 93 (!) 106 (!) 104  Resp: 18 16 16 16   Temp: 98.1 F (36.7 C) 98.8 F (37.1 C) 98.2 F (36.8 C) 98.1 F (36.7 C)  TempSrc:  Oral Oral Oral  SpO2:  100% 97% 95% 95%  Weight:  63.8 kg  Height:  5\' 11"  (1.803 m)       General: Very pleasant chronically ill appearing male in no acute distress. Head: Normocephalic, atraumatic, sclera non-icteric, mucus membranes are moist Neck: Supple. Prominent neck veins with mild JVD 1/4 to mandible.  Lungs: Bilateral breath sounds with few bibasilar crackles. No WOB.  Heart: Irreg, irreg with 2/6 systolic M. Afib on monitor Rate 80-100s. Abdomen: NABS, tender upper quadrants.  Lower extremities:Trace BLE edema Neuro: Alert and oriented X 3. Moves all extremities spontaneously. Psych:  Responds to questions appropriately with a normal affect. Dialysis Access: R AVF hugely aneurysmal with depigmented areas mid AVF + TB  Dialysis Orders: Center: AF, T,Th,S 4 hr 15 min 180NRe 400/500 60.5 kg 2.0 K/ 2.0 Ca UFP 2 AVF - No Heparin - Sensipar 30 mg PO TIW - Hectorol 7 mcg IV TIW - Mircera 225 mcg IV 11/13/2022 Next dose due 11/27/2022   Assessment/Plan:  Sickle Cell Crisis-management per primary   Symptomatic Anemia- W/U per primary. + stools. Seen by GI, no endoscopic W/U planned. HGB 6.2 Will transfuse 1 unit PRBCs with HD 11/22/2022.   Persistent Afib-rate controlled. No AC D/T GIB  ESRD -  T,Th,S HD 11/22/2022 on schedule. Do 1st shift.   Hypertension/volume  - He is over OP EDW with mild JVD, denies SOB. CXR unremarkable. Attempt to lower to OP EDW. Continue Torsemide 40 mg PO BID.   Anemia  - As noted above.  Metabolic bone disease -  Continue cinacalcet, Auryxia binders, hectorol.   Nutrition - Full liquids at present. Renal diet with fluid restrictions Albumin 3.8 11/01/2022 (OP clinic)   Chronic pain on opoid therapy- lactulose PRN constipation. Management per primary.  Dmario Russom H. Manson Passey, NP-C 11/21/2022, 12:13 PM  [employer] [phone]

## 2022-11-21 NOTE — Progress Notes (Signed)
Subjective: Patrick [name]  is a 67 year old male with a medical history significant for sickle cell disease, persistent atrial fibrillation not on anticoagulation due to bleeding, systolic CHF, ESRD on HD (TTS), COPD, sickle cell disease, hypotension, chronic pain syndrome, thrombocytopenia, liver cirrhosis, history of GI bleed secondary to angiodysplasia who was admitted for worsening allover body pain especially bilateral knees. Patient states that pain intensity has not improved since admission.  On admission, hemoglobin was 5.3 g/dL and he was transfused 1 unit PRBCs.  Patrick [name] rates his pain as 10/10.  He reports that he is uncomfortable and has not found any relief from current medication regimen.  He denies shortness of breath, dizziness, headache, nausea, vomiting, or diarrhea.  Objective:  Vital signs in last 24 hours:  Vitals:   11/20/22 1958 11/20/22 2105 11/21/22 0433 11/21/22 0829  BP: 105/71 93/62 110/65 110/68  Pulse: 99 93 (!) 106 (!) 104  Resp: 18 16 16 16   Temp: 98.1 F (36.7 C) 98.8 F (37.1 C) 98.2 F (36.8 C) 98.1 F (36.7 C)  TempSrc:  Oral Oral Oral  SpO2: 100% 97% 95% 95%  Weight:  63.8 kg    Height:  5\' 11"  (1.803 m)      Intake/Output from previous day:   Intake/Output Summary (Last 24 hours) at 11/21/2022 1155 Last data filed at 11/21/2022 0800 Gross per 24 hour  Intake 480 ml  Output --  Net 480 ml   Physical Exam Constitutional:      Appearance: He is ill-appearing.  Cardiovascular:     Rate and Rhythm: Tachycardia present. Rhythm irregular.  Pulmonary:     Effort: Pulmonary effort is normal.     Breath sounds: Normal breath sounds.  Abdominal:     General: There is abdominal bruit.     Palpations: There is no fluid wave.     Tenderness: There is no abdominal tenderness. There is no guarding.  Musculoskeletal:     Right knee: Swelling present. Decreased range of motion. Tenderness present.     Left knee: Swelling present. Decreased  range of motion. Tenderness present.  Skin:    General: Skin is warm.     Lab Results:  Basic Metabolic Panel:    Component Value Date/Time   NA 134 (L) 11/21/2022 0430   NA 138 08/01/2022 1348   K 4.3 11/21/2022 0430   CL 92 (L) 11/21/2022 0430   CO2 26 11/21/2022 0430   BUN 51 (H) 11/21/2022 0430   BUN 41 (H) 08/01/2022 1348   CREATININE 6.68 (H) 11/21/2022 0430   CREATININE 2.90 (H) 08/11/2015 1359   GLUCOSE 88 11/21/2022 0430   CALCIUM 7.8 (L) 11/21/2022 0430   CBC:    Component Value Date/Time   WBC 5.3 11/21/2022 0430   HGB 6.2 (LL) 11/21/2022 0430   HGB 7.9 (L) 10/30/2022 1228   HCT 16.6 (L) 11/21/2022 0430   HCT 22.1 (L) 10/30/2022 1228   PLT 107 (L) 11/21/2022 0430   PLT 92 (LL) 10/30/2022 1228   MCV 101.2 (H) 11/21/2022 0430   MCV 104 (H) 10/30/2022 1228   NEUTROABS 7.5 07/23/2022 1813   NEUTROABS 5.4 11/28/2021 1026   LYMPHSABS 0.2 (L) 07/23/2022 1813   LYMPHSABS 0.4 (L) 11/28/2021 1026   MONOABS 1.8 (H) 07/23/2022 1813   EOSABS 0.0 07/23/2022 1813   EOSABS 0.1 11/28/2021 1026   BASOSABS 0.0 07/23/2022 1813   BASOSABS 0.0 11/28/2021 1026    Recent Results (from the past 240 hour(s))  MRSA  Next Gen by PCR, Nasal     Status: None   Collection Time: 11/20/22  9:50 PM   Specimen: Nasal Mucosa; Nasal Swab  Result Value Ref Range Status   MRSA by PCR Next Gen NOT DETECTED NOT DETECTED Final    Comment: (NOTE) The GeneXpert MRSA Assay (FDA approved for NASAL specimens only), is one component of a comprehensive MRSA colonization surveillance program. It is not intended to diagnose MRSA infection nor to guide or monitor treatment for MRSA infections. Test performance is not FDA approved in patients less than 27 years old. Performed at [facility], [address], [place], [place] [address]     Studies/Results: DG Chest 2 View  Result Date: 11/20/2022 CLINICAL DATA:  Chest pain. EXAM: CHEST - 2 VIEW COMPARISON:  10/20/2022. FINDINGS: Stable  chronic interstitial prominence. No consolidation or pulmonary edema. Stable cardiomegaly and mediastinal contours. No pleural effusion or pneumothorax. Visualized bones and upper abdomen are unremarkable. IMPRESSION: No evidence of acute cardiopulmonary disease. Electronically Signed   By: Orvan Falconer M.D.   On: 11/20/2022 13:07    Medications: Scheduled Meds:  sodium chloride   Intravenous Once   sodium chloride   Intravenous Once   acetaminophen  650 mg Oral Once   diphenhydrAMINE  12.5 mg Intravenous Once   ferric citrate  420 mg Oral TID WC   And   ferric citrate  210 mg Oral With snacks   folic acid  1 mg Oral Daily   [START ON 11/22/2022] lidocaine-prilocaine  1 Application Topical Q T,Th,Sa-HD   oxyCODONE  10 mg Oral Q12H   pantoprazole  40 mg Oral BID AC   sodium chloride flush  3 mL Intravenous Q12H   Continuous Infusions:  sodium chloride 50 mL/hr at 11/21/22 0830   PRN Meds:.acetaminophen **OR** acetaminophen, albuterol, HYDROmorphone (DILAUDID) injection, lactulose, ondansetron, oxyCODONE  Consultants: Renal  Procedures: none  Antibiotics: none  Assessment/Plan: Active Problems:   Sickle cell crisis (HCC)   Sickle cell disease (HCC)   ESRD on HD TTS   Permanent atrial fibrillation (HCC)   Chronic systolic CHF (congestive heart failure) (HCC)   COPD (chronic obstructive pulmonary disease) (HCC)   Symptomatic anemia   GI bleed   Symptomatic anemia possibly secondary to GI bleed: On admission, hemoglobin 6.3.  Guaiac positive.  Patient is status post 1 unit PRBCs.  Patient may benefit from an additional unit of PRBCs.  Reviewed by renal team, patient will receive blood following HD on 11/22/2022. Will continue Protonix. Monitor closely.  Labs in AM.  Sickle cell disease with pain crisis: Patient reporting allover body pain that has been worsening overnight.  Will initiate reduced dose PCA for greater pain control.  Patient reports feeling like he is being  "hit by a sledgehammer".  Continue folic acid, and hydroxyurea.  ESRD on HD: Patient on TTS schedule.  Last received dialysis on yesterday.  Nephrology will assess in AM.  Systolic heart failure: Chronic.  Most recent echo shows EF 35 to 40%.  Monitor fluid intake closely.  History of gout, Continue allopurinol  History of COPD.  Patient is a former smoker.  No wheezing at this time.  Albuterol nebulizers as needed for shortness of breath     Code Status: Full Code Family Communication: N/A Disposition Plan: Not yet ready for discharge  Nolon Nations  APRN, MSN, FNP-C [facility] [address] [place], [place] [address] [phone]  If 7PM-7AM, please  contact night-coverage.  11/21/2022, 11:55 AM  LOS: 0 days

## 2022-11-21 NOTE — Progress Notes (Signed)
New Admission Note:   Arrival Method: stretcher Mental Orientation: alert x 4 Telemetry: yes Assessment: Completed Skin: see flowsheet IV: nsl Pain: yes pain med given Tubes: none Safety Measures: Safety Fall Prevention Plan has been discussed Admission: Completed [facility] Orientation: Patient has been orientated to the room, unit and staff.  Family:  Orders have been reviewed and implemented. Will continue to monitor the patient. Call light has been placed within reach and bed alarm has been activated.   Artemio Aly BSN, RN Phone number: [phone]

## 2022-11-21 NOTE — Consult Note (Addendum)
Consultation  Referring Provider: No ref. provider found Primary Care Physician:  Quentin Angst, MD Primary Gastroenterologist:  Dr.Gupta  Reason for Consultation: Anemia, heme positive stool, abdominal pain  HPI: Patrick [name] is a 67 y.o. male with multiple comorbidities including end-stage renal disease on dialysis, sickle cell disease, compensated cirrhosis, COPD and chronic thrombocytopenia.  He has history of atrial fibrillation but is not anticoagulated due to history of GI bleeding, and congestive heart failure with EF of 35 to 40%.  He is status post cholecystectomy, all 2023 and that was complicated by a bile leak which required stent placement in October.  Stent has since been removed. He was admitted yesterday after dialysis feeling poorly and complaining of pain all over. He says that he started feeling very bad on Sunday 07/2022 and describes a "pounding" type pain involving his arms legs and back which has been unrelenting.  He has not had any known fever or chills.  No nausea or vomiting and had been eating okay. He says he has been having some right-sided abdominal pain over the past week and also notes some pain across the very lower abdomen.  He says it hurts to take a deep breath which aggravates the right upper quadrant pain.  Feels that he has been constipated and has not had a good bowel movement for multiple days despite taking some laxatives this past weekend. He is visually impaired and cannot tell the color of his stool so has been unaware whether he has had any overt melena or hematochezia.  He had just undergone EGD on 10/22/2022 when he was hospitalized with complaints of upper abdominal pain, anemia and heme positive stool. Does have history of previously documented gastric ulcer and gastric AVMs which have been treated with APC in the past.  ED on 10/22/2022 showed a 2 cm hiatal hernia moderate inflammation with erosions and shallow ulcerations in the  gastric antrum, no AVMs noted Biopsies from the stomach showed mild inactive gastritis no H. pylori. Did have CT during that admission noncontrasted did not show any acute abnormality, did show cirrhotic appearing liver  Last colonoscopy 2019 done for iron deficiency anemia normal other than small internal hemorrhoids.  He has had multiple EGDs over the past few years.  Is on Arixtra, and takes hydroxyurea for sickle cell. Feels that his pain is consistent with a sickle cell crisis type pain.  He is uncertain about the abdominal pain.  Hemoglobin on 10/23/2022 with last admission prior to discharge was 7.1 10/30/2022 hemoglobin 7.9/hematocrit 22.1 Yesterday 11/20/2022 hemoglobin 6.3/hematocrit 17 BUN 45/creatinine 5.63 Troponins 41> 42 He has been transfused early this a.m. and follow-up hemoglobin pending  He is on significant doses of narcotics here, does not take any chronic narcotics at home, but is still complaining of a lot of pain.    Past Medical History:  Diagnosis Date   A-fib (HCC)    Blood dyscrasia    Blood transfusion    "I've had a bunch of them"   CHF (congestive heart failure) (HCC)    followed by hf clinic   Chronic kidney disease    Stage 5, dialysis on Tu/Th/Sa   Dialysis patient (HCC) 04/2019   AV fistula (right arm), TTS   Dysrhythmia    A-Fib per patient   Early satiety    Elevated LFTs 04/01/2022   Elevated troponin 04/01/2022   Empyema of gallbladder 05/27/2021   GERD (gastroesophageal reflux disease)    Gout  Legally blind    Metabolic acidosis with increased anion gap and accumulation of organic acids 04/01/2022   Mitral regurgitation    Muscle tightness-Right arm  05/27/2014   Pneumonia    Pulmonary hypertension (HCC)    Shortness of breath dyspnea    Sickle cell disease, type SS (HCC)    Swelling abdomen    Swelling of extremity, left    Swelling of extremity, right    Tricuspid regurgitation     Past Surgical History:  Procedure  Laterality Date   A/V FISTULAGRAM Right 07/29/2020   Procedure: A/V FISTULAGRAM - Right Arm;  Surgeon: Chuck Hint, MD;  Location: [facility] INVASIVE CV LAB;  Service: Cardiovascular;  Laterality: Right;   BASCILIC VEIN TRANSPOSITION Right 04/12/2014   Procedure: BASILIC VEIN TRANSPOSITION;  Surgeon: Sherren Kerns, MD;  Location: [facility] OR;  Service: Vascular;  Laterality: Right;   BILIARY STENT PLACEMENT  04/04/2022   Procedure: BILIARY STENT PLACEMENT;  Surgeon: Meryl Dare, MD;  Location: [facility] ENDOSCOPY;  Service: Gastroenterology;;   BIOPSY  08/04/2020   Procedure: BIOPSY;  Surgeon: Meryl Dare, MD;  Location: [facility] ENDOSCOPY;  Service: Gastroenterology;;   BIOPSY  06/28/2021   Procedure: BIOPSY;  Surgeon: Beverley Fiedler, MD;  Location: [facility] ENDOSCOPY;  Service: Gastroenterology;;   BIOPSY  04/02/2022   Procedure: BIOPSY;  Surgeon: Lemar Lofty., MD;  Location: [facility] ENDOSCOPY;  Service: Gastroenterology;;   BIOPSY  10/22/2022   Procedure: BIOPSY;  Surgeon: Beverley Fiedler, MD;  Location: [facility] ENDOSCOPY;  Service: Gastroenterology;;   BOWEL RESECTION N/A 05/24/2021   Procedure: SMALL BOWEL RESECTION;  Surgeon: Emelia Loron, MD;  Location: [facility] OR;  Service: General;  Laterality: N/A;   CARDIOVERSION N/A 06/05/2018   Procedure: CARDIOVERSION;  Surgeon: Dolores Patty, MD;  Location: [facility] ENDOSCOPY;  Service: Cardiovascular;  Laterality: N/A;   CARDIOVERSION N/A 10/06/2019   Procedure: CARDIOVERSION;  Surgeon: Dolores Patty, MD;  Location: [facility] ENDOSCOPY;  Service: Cardiovascular;  Laterality: N/A;   CHOLECYSTECTOMY N/A 05/24/2021   Procedure: OPEN CHOLECYSTECTOMY;  Surgeon: Emelia Loron, MD;  Location: [facility] OR;  Service: General;  Laterality: N/A;   COLONOSCOPY WITH PROPOFOL N/A 09/24/2017   Procedure: COLONOSCOPY WITH PROPOFOL;  Surgeon: Lynann Bologna, MD;  Location: [facility] ENDOSCOPY;  Service: Endoscopy;  Laterality: N/A;   ENDOSCOPIC RETROGRADE CHOLANGIOPANCREATOGRAPHY  (ERCP) WITH PROPOFOL N/A 05/17/2022   Procedure: ENDOSCOPIC RETROGRADE CHOLANGIOPANCREATOGRAPHY (ERCP) WITH PROPOFOL;  Surgeon: Meridee Score Netty Starring., MD;  Location: [facility] ENDOSCOPY;  Service: Gastroenterology;  Laterality: N/A;   ERCP N/A 04/04/2022   Procedure: ENDOSCOPIC RETROGRADE CHOLANGIOPANCREATOGRAPHY (ERCP);  Surgeon: Meryl Dare, MD;  Location: [facility] ENDOSCOPY;  Service: Gastroenterology;  Laterality: N/A;   ESOPHAGOGASTRODUODENOSCOPY N/A 09/19/2017   Procedure: ESOPHAGOGASTRODUODENOSCOPY (EGD);  Surgeon: Lynann Bologna, MD;  Location: [facility] ENDOSCOPY;  Service: Endoscopy;  Laterality: N/A;   ESOPHAGOGASTRODUODENOSCOPY N/A 05/29/2021   Procedure: ESOPHAGOGASTRODUODENOSCOPY (EGD);  Surgeon: Rachael Fee, MD;  Location: [facility] ENDOSCOPY;  Service: Gastroenterology;  Laterality: N/A;   ESOPHAGOGASTRODUODENOSCOPY (EGD) WITH PROPOFOL N/A 08/04/2020   Procedure: ESOPHAGOGASTRODUODENOSCOPY (EGD) WITH PROPOFOL;  Surgeon: Meryl Dare, MD;  Location: [facility] ENDOSCOPY;  Service: Gastroenterology;  Laterality: N/A;   ESOPHAGOGASTRODUODENOSCOPY (EGD) WITH PROPOFOL N/A 06/28/2021   Procedure: ESOPHAGOGASTRODUODENOSCOPY (EGD) WITH PROPOFOL;  Surgeon: Beverley Fiedler, MD;  Location: [facility] ENDOSCOPY;  Service: Gastroenterology;  Laterality: N/A;   ESOPHAGOGASTRODUODENOSCOPY (EGD) WITH PROPOFOL N/A 08/10/2021   Procedure: ESOPHAGOGASTRODUODENOSCOPY (EGD) WITH PROPOFOL;  Surgeon: Lynann Bologna, MD;  Location: [facility] ENDOSCOPY;  Service: Gastroenterology;  Laterality: N/A;   ESOPHAGOGASTRODUODENOSCOPY (EGD) WITH PROPOFOL N/A 08/02/2021   Procedure: ESOPHAGOGASTRODUODENOSCOPY (EGD) WITH PROPOFOL;  Surgeon: Shellia Cleverly, DO;  Location: [facility] ENDOSCOPY;  Service: Gastroenterology;  Laterality: N/A;   ESOPHAGOGASTRODUODENOSCOPY (EGD) WITH PROPOFOL N/A 04/02/2022   Procedure: ESOPHAGOGASTRODUODENOSCOPY (EGD) WITH PROPOFOL;  Surgeon: Meridee Score Netty Starring., MD;  Location: [facility] ENDOSCOPY;  Service: Gastroenterology;   Laterality: N/A;   ESOPHAGOGASTRODUODENOSCOPY (EGD) WITH PROPOFOL N/A 05/17/2022   Procedure: ESOPHAGOGASTRODUODENOSCOPY (EGD) WITH PROPOFOL;  Surgeon: Meridee Score Netty Starring., MD;  Location: [facility] ENDOSCOPY;  Service: Gastroenterology;  Laterality: N/A;   ESOPHAGOGASTRODUODENOSCOPY (EGD) WITH PROPOFOL N/A 10/22/2022   Procedure: ESOPHAGOGASTRODUODENOSCOPY (EGD) WITH PROPOFOL;  Surgeon: Beverley Fiedler, MD;  Location: [facility] ENDOSCOPY;  Service: Gastroenterology;  Laterality: N/A;   EUS N/A 05/17/2022   Procedure: UPPER ENDOSCOPIC ULTRASOUND (EUS) RADIAL;  Surgeon: Lemar Lofty., MD;  Location: [facility] ENDOSCOPY;  Service: Gastroenterology;  Laterality: N/A;   FINE NEEDLE ASPIRATION  04/02/2022   Procedure: FINE NEEDLE ASPIRATION (FNA) LINEAR;  Surgeon: Lemar Lofty., MD;  Location: [facility] ENDOSCOPY;  Service: Gastroenterology;;   FINE NEEDLE ASPIRATION N/A 05/17/2022   Procedure: FINE NEEDLE ASPIRATION (FNA) LINEAR;  Surgeon: Lemar Lofty., MD;  Location: [facility] ENDOSCOPY;  Service: Gastroenterology;  Laterality: N/A;   HEMOSTASIS CLIP PLACEMENT  05/29/2021   Procedure: HEMOSTASIS CLIP PLACEMENT;  Surgeon: Rachael Fee, MD;  Location: [facility] ENDOSCOPY;  Service: Gastroenterology;;   HOT HEMOSTASIS N/A 05/29/2021   Procedure: HOT HEMOSTASIS (ARGON PLASMA COAGULATION/BICAP);  Surgeon: Rachael Fee, MD;  Location: [facility] ENDOSCOPY;  Service: Gastroenterology;  Laterality: N/A;   HOT HEMOSTASIS N/A 06/28/2021   Procedure: HOT HEMOSTASIS (ARGON PLASMA COAGULATION/BICAP);  Surgeon: Beverley Fiedler, MD;  Location: [facility] ENDOSCOPY;  Service: Gastroenterology;  Laterality: N/A;   HOT HEMOSTASIS N/A 08/10/2021   Procedure: HOT HEMOSTASIS (ARGON PLASMA COAGULATION/BICAP);  Surgeon: Lynann Bologna, MD;  Location: [facility] ENDOSCOPY;  Service: Gastroenterology;  Laterality: N/A;   HOT HEMOSTASIS N/A 08/02/2021   Procedure: HOT HEMOSTASIS (ARGON PLASMA COAGULATION/BICAP);  Surgeon: Shellia Cleverly, DO;   Location: [facility] ENDOSCOPY;  Service: Gastroenterology;  Laterality: N/A;   IR PARACENTESIS  07/04/2022   IR PERC TUN PERIT CATH WO PORT S&I /IMAG  05/23/2021   IR REMOVAL TUN CV CATH W/O FL  06/10/2021   IR US GUIDE VASC ACCESS RIGHT  05/23/2021   LAPAROSCOPY N/A 05/24/2021   Procedure: LAPAROSCOPY DIAGNOSTIC;  Surgeon: Emelia Loron, MD;  Location: [facility] OR;  Service: General;  Laterality: N/A;   LAPAROTOMY N/A 05/24/2021   Procedure: EXPLORATORY LAPAROTOMY;  Surgeon: Emelia Loron, MD;  Location: [facility] OR;  Service: General;  Laterality: N/A;   LEFT AND RIGHT HEART CATHETERIZATION WITH CORONARY ANGIOGRAM N/A 06/11/2011   Procedure: LEFT AND RIGHT HEART CATHETERIZATION WITH CORONARY ANGIOGRAM;  Surgeon: Laurey Morale, MD;  Location: [facility] CATH LAB;  Service: Cardiovascular;  Laterality: N/A;   PERIPHERAL VASCULAR BALLOON ANGIOPLASTY Right 07/29/2020   Procedure: PERIPHERAL VASCULAR BALLOON ANGIOPLASTY;  Surgeon: Chuck Hint, MD;  Location: [facility] INVASIVE CV LAB;  Service: Cardiovascular;  Laterality: Right;  arm fistula   REMOVAL OF STONES  05/17/2022   Procedure: REMOVAL OF STONES;  Surgeon: Meridee Score Netty Starring., MD;  Location: [facility] ENDOSCOPY;  Service: Gastroenterology;;   REVISON OF ARTERIOVENOUS FISTULA Right 08/08/2020   Procedure: ANEURYSM EXCISION OF RIGHT UPPER EXTREMITY ARTERIOVENOUS FISTULA;  Surgeon: Chuck Hint, MD;  Location: [facility] OR;  Service: Vascular;  Laterality: Right;   REVISON OF ARTERIOVENOUS FISTULA Right 03/21/2022   Procedure: EXCISION  OF ULCERATED SKIN OF RIGHT ARTERIOVENOUS FISTULA REVISION;  Surgeon: Cephus Shelling, MD;  Location: [facility] OR;  Service: Vascular;  Laterality: Right;   REVISON OF ARTERIOVENOUS FISTULA Right 07/02/2022   Procedure: EXCISION OF ULCERS RIGHT ARTERIOVENOUS FISTULA;  Surgeon: Chuck Hint, MD;  Location: [facility] OR;  Service: Vascular;  Laterality: Right;   SCLEROTHERAPY  05/29/2021   Procedure: Susa Day;  Surgeon:  Rachael Fee, MD;  Location: [facility] ENDOSCOPY;  Service: Gastroenterology;;   Dennison Mascot  04/04/2022   Procedure: SPHINCTEROTOMY;  Surgeon: Meryl Dare, MD;  Location: [facility] ENDOSCOPY;  Service: Gastroenterology;;   Francine Graven REMOVAL  05/29/2021   Procedure: STENT REMOVAL;  Surgeon: Rachael Fee, MD;  Location: [facility] ENDOSCOPY;  Service: Gastroenterology;;   Francine Graven REMOVAL  05/17/2022   Procedure: STENT REMOVAL;  Surgeon: Lemar Lofty., MD;  Location: [facility] ENDOSCOPY;  Service: Gastroenterology;;   TEE WITHOUT CARDIOVERSION N/A 12/27/2015   Procedure: TRANSESOPHAGEAL ECHOCARDIOGRAM (TEE);  Surgeon: Quintella Reichert, MD;  Location: [facility] ENDOSCOPY;  Service: Cardiovascular;  Laterality: N/A;   UPPER ESOPHAGEAL ENDOSCOPIC ULTRASOUND (EUS) N/A 04/02/2022   Procedure: UPPER ESOPHAGEAL ENDOSCOPIC ULTRASOUND (EUS);  Surgeon: Lemar Lofty., MD;  Location: [facility] ENDOSCOPY;  Service: Gastroenterology;  Laterality: N/A;    Prior to Admission medications   Medication Sig Start Date End Date Taking? Authorizing Provider  acetaminophen (TYLENOL) 500 MG tablet Take 1,500 mg by mouth 3 (three) times daily as needed for mild pain, moderate pain, fever or headache.   Yes [provider]  allopurinol (ZYLOPRIM) 100 MG tablet Take 1 tablet (100 mg total) by mouth daily. 11/07/22  Yes Ivonne Andrew, NP  AURYXIA 1 GM 210 MG(Fe) tablet Take 210-420 mg by mouth See admin instructions. 420 mg with every meal, 210 mg with each snack. 02/11/21  Yes [provider]  cinacalcet (SENSIPAR) 30 MG tablet Take 1 tablet (30 mg total) by mouth Every Tuesday,Thursday,and Saturday with dialysis. 06/12/22  Yes Shahmehdi, Seyed A, MD  diphenhydramine-acetaminophen (TYLENOL PM) 25-500 MG TABS tablet Take 2 tablets by mouth at bedtime as needed (sleep/pain). 09/26/22  Yes Ivonne Andrew, NP  folic acid (FOLVITE) 1 MG tablet TAKE 1 TABLET BY MOUTH EVERY DAY 11/01/22  Yes Massie Maroon, FNP  hydroxyurea  (HYDREA) 500 MG capsule Take 1 capsule (500 mg total) by mouth daily. 06/01/22  Yes Jegede, [name], MD  Iron-Vitamins (GERITOL PO) Take 15 mLs by mouth daily.   Yes [provider]  lactulose (CHRONULAC) 10 GM/15ML solution Take 30 mLs (20 g total) by mouth 2 (two) times daily as needed for mild constipation. 10/23/22  Yes Danford, Earl Lites, MD  lidocaine-prilocaine (EMLA) cream Apply 1 application. topically Every Tuesday,Thursday,and Saturday with dialysis. 09/08/19  Yes [provider]  oxyCODONE (OXYCONTIN) 10 mg 12 hr tablet Take 1 tablet (10 mg total) by mouth every 12 (twelve) hours. 11/02/22  Yes Massie Maroon, FNP  Oxycodone HCl 10 MG TABS Take 1 tablet (10 mg total) by mouth every 6 (six) hours as needed. 11/16/22  Yes Massie Maroon, FNP  pantoprazole (PROTONIX) 40 MG tablet Take 1 tablet (40 mg total) by mouth 2 (two) times daily before a meal. 10/30/22  Yes Massie Maroon, FNP  tiZANidine (ZANAFLEX) 4 MG tablet TAKE 1/2 TABLET (2MG  TOTAL) BY MOUTH EVERY 8 HOURS AS NEEDED FOR MUSCLE SPASM 11/14/22  Yes Massie Maroon, FNP  Torsemide 40 MG TABS Take 40 mg by mouth in the morning and at bedtime.  09/26/22 11/20/22 Yes Ivonne Andrew, NP    Current Facility-Administered Medications  Medication Dose Route Frequency Provider Last Rate Last Admin   0.45 % sodium chloride infusion   Intravenous Continuous Madelyn Flavors A, MD 50 mL/hr at 11/21/22 0830 Rate Change at 11/21/22 0830   0.9 %  sodium chloride infusion (Manually program via Guardrails IV Fluids)   Intravenous Once Jacalyn Lefevre, MD       acetaminophen (TYLENOL) tablet 650 mg  650 mg Oral Q6H PRN Clydie Braun, MD       Or   acetaminophen (TYLENOL) suppository 650 mg  650 mg Rectal Q6H PRN Madelyn Flavors A, MD       albuterol (PROVENTIL) (2.5 MG/3ML) 0.083% nebulizer solution 2.5 mg  2.5 mg Nebulization Q6H PRN Clydie Braun, MD       ferric citrate (AURYXIA) tablet 420 mg  420 mg Oral TID WC  Smith, Rondell A, MD   420 mg at 11/21/22 0825   And   ferric citrate (AURYXIA) tablet 210 mg  210 mg Oral With snacks Katrinka Blazing, Rondell A, MD   210 mg at 11/20/22 2142   folic acid (FOLVITE) tablet 1 mg  1 mg Oral Daily Madelyn Flavors A, MD   1 mg at 11/21/22 1610   HYDROmorphone (DILAUDID) injection 0.5 mg  0.5 mg Intravenous Q3H PRN Madelyn Flavors A, MD   0.5 mg at 11/21/22 0445   lactulose (CHRONULAC) 10 GM/15ML solution 20 g  20 g Oral BID PRN Clydie Braun, MD       [START ON 11/22/2022] lidocaine-prilocaine (EMLA) cream 1 Application  1 Application Topical Q T,Th,Sa-HD Smith, Rondell A, MD       ondansetron (ZOFRAN) injection 4 mg  4 mg Intravenous Q4H PRN Jacalyn Lefevre, MD       oxyCODONE (Oxy IR/ROXICODONE) immediate release tablet 10 mg  10 mg Oral Q6H PRN Madelyn Flavors A, MD   10 mg at 11/20/22 1837   oxyCODONE (OXYCONTIN) 12 hr tablet 10 mg  10 mg Oral Q12H Smith, Rondell A, MD   10 mg at 11/21/22 0826   pantoprazole (PROTONIX) EC tablet 40 mg  40 mg Oral BID AC Smith, Rondell A, MD   40 mg at 11/21/22 9604   sodium chloride flush (NS) 0.9 % injection 3 mL  3 mL Intravenous Q12H Madelyn Flavors A, MD   3 mL at 11/21/22 0826    Allergies as of 11/20/2022   (No Known Allergies)    Family History  Problem Relation Age of Onset   Alcohol abuse Mother    Liver disease Mother    Cirrhosis Mother    Heart attack Mother     Social History   Socioeconomic History   Marital status: Divorced    Spouse name: Not on file   Number of children: Not on file   Years of education: Not on file   Highest education level: Not on file  Occupational History   Occupation: disabled  Tobacco Use   Smoking status: Former    Packs/day: 0.50    Years: 41.00    Additional pack years: 0.00    Total pack years: 20.50    Types: Cigarettes    Quit date: 12/08/2015    Years since quitting: 6.9   Smokeless tobacco: Never  Vaping Use   Vaping Use: Never used  Substance and Sexual Activity    Alcohol use: Not Currently    Alcohol/week: 3.0 standard drinks of alcohol  Types: 1 Glasses of wine, 2 Cans of beer per week    Comment: none since starting dialysis 06/2021   Drug use: No   Sexual activity: Not Currently  Other Topics Concern   Not on file  Social History Narrative   Not on file   Social Determinants of Health   Financial Resource Strain: Low Risk  (09/27/2022)   Overall Financial Resource Strain (CARDIA)    Difficulty of Paying Living Expenses: Not hard at all  Food Insecurity: No Food Insecurity (11/20/2022)   Hunger Vital Sign    Worried About Running Out of Food in the Last Year: Never true    Ran Out of Food in the Last Year: Never true  Transportation Needs: No Transportation Needs (11/20/2022)   PRAPARE - Administrator, Civil Service (Medical): No    Lack of Transportation (Non-Medical): No  Physical Activity: Inactive (09/27/2022)   Exercise Vital Sign    Days of Exercise per Week: 0 days    Minutes of Exercise per Session: 0 min  Stress: No Stress Concern Present (09/27/2022)   [employer] of Occupational Health - Occupational Stress Questionnaire    Feeling of Stress : Not at all  Social Connections: Socially Isolated (09/27/2022)   Social Connection and Isolation Panel [NHANES]    Frequency of Communication with Friends and Family: More than three times a week    Frequency of Social Gatherings with Friends and Family: More than three times a week    Attends Religious Services: Never    Database administrator or Organizations: No    Attends Banker Meetings: Never    Marital Status: Divorced  Catering manager Violence: Not At Risk (11/20/2022)   Humiliation, Afraid, Rape, and Kick questionnaire    Fear of Current or Ex-Partner: No    Emotionally Abused: No    Physically Abused: No    Sexually Abused: No    Review of Systems: Pertinent positive and negative review of systems were noted in the above HPI section.  All  other review of systems was otherwise negative.   Physical Exam: Vital signs in last 24 hours: Temp:  [98 F (36.7 C)-98.9 F (37.2 C)] 98.1 F (36.7 C) (06/05 0829) Pulse Rate:  [93-129] 104 (06/05 0829) Resp:  [16-20] 16 (06/05 0829) BP: (93-110)/(51-76) 110/68 (06/05 0829) SpO2:  [90 %-100 %] 95 % (06/05 0829) Weight:  [63 kg-63.8 kg] 63.8 kg (06/04 2105) Last BM Date : 11/20/22 General:   Alert,  Well-developed, well-nourished, chronically ill-appearing older African-American male pleasant and cooperative in NAD Head:  Normocephalic and atraumatic. Eyes:  Sclera clear, no icterus.   Conjunctiva pale Ears:  Normal auditory acuity. Nose:  No deformity, discharge,  or lesions. Mouth:  No deformity or lesions.   Neck:  Supple; no masses or thyromegaly. Lungs:  Clear throughout to auscultation.   No wheezes, crackles, or rhonchi.  Heart:  irrRegular rate and rhythm; no murmurs, clicks, rubs,  or gallops. Abdomen:  Soft, he is tender in the epigastrium and right upper quadrant with some guarding no rebound, BS active,nonpalp mass or hsm.  Is also some tenderness across the lower abdomen Rectal: Not done as documented heme positive Msk:  Symmetrical without gross deformities. . Pulses:  Normal pulses noted. Extremities:  Without clubbing or edema. Neurologic:  Alert and  oriented x4;  grossly normal neurologically. Skin:  Intact without significant lesions or rashes.. Psych:  Alert and cooperative. Normal mood and affect.  Intake/Output from previous day: 06/04 0701 - 06/05 0700 In: 180 [Blood:180] Out: -  Intake/Output this shift: Total I/O In: 300 [P.O.:300] Out: -   Lab Results: Recent Labs    11/20/22 1340 11/21/22 0430  WBC 6.6 5.3  HGB 6.3* 6.2*  HCT 17.1* 16.6*  PLT 118* 107*   BMET Recent Labs    11/20/22 1340 11/21/22 0430  NA 138 134*  K 3.3* 4.3  CL 92* 92*  CO2 29 26  GLUCOSE 89 88  BUN 45* 51*  CREATININE 5.63* 6.68*  CALCIUM 8.8* 7.8*    LFT Recent Labs    11/20/22 1330  PROT 7.9  ALBUMIN 3.6  AST 61*  ALT 40  ALKPHOS 230*  BILITOT 2.2*  BILIDIR 1.0*  IBILI 1.2*   PT/INR No results for input(s): "LABPROT", "INR" in the last 72 hours. Hepatitis Panel No results for input(s): "HEPBSAG", "HCVAB", "HEPAIGM", "HEPBIGM" in the last 72 hours.   IMPRESSION:  #62 67 year old African-American male admitted yesterday, after dialysis feeling weak and somewhat lethargic and complaining of pain all over. Patient has sickle cell disease and his pain is consistent with sickle cell crisis. Primarily having pain in the back, arms and legs.  #2 stage renal disease on dialysis #3 acute on chronic anemia heme positive-no evidence for acute GI bleeding.  Patient just had EGD last month for anemia and heme positive stool and was found to have moderate gastric erosions and shallow ulcerations in the antrum, biopsies negative for H. pylori.  He is on PPI therapy  He also has previously diagnosed gastric AVMs which have been treated and certainly may have other small bowel AVMs.  # 5 acute recurring abdominal pain over the past week located in the epigastrium/right upper quadrant.  This may also be secondary to sickle cell crisis type pain, however cannot rule out other intra-abdominal inflammatory process. He is status post cholecystectomy fall 2023 which was complicated by bile leak and stent placement/stent removal  #6 history of compensated cirrhosis #7 COPD #8.  Atrial fibrillation not anticoagulated  Plan; CT abdomen and pelvis without contrast today Full Liquid diet Twice daily PPI INR Trend hemoglobin and transfuse as indicated Not certain that he needs repeat endoscopic evaluation this admission as we have a very recently documented source for heme positive stool and also suspect he may have other AVMs. GI will follow with you     Amy Esterwood PA-C 11/21/2022, 11:21 AM   Attending physician's note  I have taken a  history, reviewed the chart and examined the patient. I performed a substantive portion of this encounter, including complete performance of at least one of the key components, in conjunction with the APP. I agree with the APP's note, impression and recommendations.   67 year old very pleasant male with sickle cell disease, compensated cirrhosis, chronic thrombocytopenia, ESRD on hemodialysis, CHF admitted with worsening anemia and abdominal pain.  He was recently hospitalized and underwent EGD with findings of erosive gastritis and ulceration in the gastric antrum.  H. pylori negative. His presentation is concerning for sickle cell crisis Continue IV fluids Pain control Will follow-up CT abdomen pelvis without contrast  Will hold off repeat endoscopic evaluation at this point, will likely be low yield, he did not have any high risk lesions on most recent EGD  GI will continue to follow along   The patient was provided an opportunity to ask questions and all were answered. The patient agreed with the plan and demonstrated an understanding of  the instructions.  Iona Beard , MD [phone]

## 2022-11-21 NOTE — Progress Notes (Signed)
  Transition of Care [facility]) Screening Note   Patient Details  Name: Patrick [name] Date of Birth: Dec 08, 1955   Transition of Care [facility]) CM/SW Contact:    Tom-Johnson, Hershal Coria, RN Phone Number: 11/21/2022, 1:44 PM  Patient is admitted with worsening Pain to lower extremities after his Outpatient HD session yesterday 11/20/22.  Hgb on admit was 6.3, BUN 45, Creatinine 5.63,  Stool Guaiacs was noted to be positive.1 U of PRBC given. Today, hgb at 6.2.  Nephrology and GI following.  Patient is Legally Blind.   Transition of Care Department [facility]) has reviewed patient and no TOC needs or recommendations have been identified at this time. TOC will continue to monitor patient advancement through interdisciplinary progression rounds. If new patient transition needs arise, please place a TOC consult.

## 2022-11-22 ENCOUNTER — Inpatient Hospital Stay (HOSPITAL_COMMUNITY): Payer: PPO

## 2022-11-22 DIAGNOSIS — D649 Anemia, unspecified: Secondary | ICD-10-CM | POA: Diagnosis not present

## 2022-11-22 DIAGNOSIS — R195 Other fecal abnormalities: Secondary | ICD-10-CM | POA: Diagnosis not present

## 2022-11-22 DIAGNOSIS — D5700 Hb-SS disease with crisis, unspecified: Secondary | ICD-10-CM | POA: Diagnosis not present

## 2022-11-22 DIAGNOSIS — R109 Unspecified abdominal pain: Secondary | ICD-10-CM | POA: Diagnosis not present

## 2022-11-22 DIAGNOSIS — D571 Sickle-cell disease without crisis: Secondary | ICD-10-CM | POA: Diagnosis not present

## 2022-11-22 LAB — TYPE AND SCREEN: Unit division: 0

## 2022-11-22 LAB — RENAL FUNCTION PANEL
Albumin: 3 g/dL — ABNORMAL LOW (ref 3.5–5.0)
Anion gap: 16 — ABNORMAL HIGH (ref 5–15)
BUN: 61 mg/dL — ABNORMAL HIGH (ref 8–23)
CO2: 24 mmol/L (ref 22–32)
Calcium: 8.1 mg/dL — ABNORMAL LOW (ref 8.9–10.3)
Chloride: 88 mmol/L — ABNORMAL LOW (ref 98–111)
Creatinine, Ser: 7.8 mg/dL — ABNORMAL HIGH (ref 0.61–1.24)
GFR, Estimated: 7 mL/min — ABNORMAL LOW (ref >60)
Glucose, Bld: 95 mg/dL (ref 70–99)
Phosphorus: 6.1 mg/dL — ABNORMAL HIGH (ref 2.5–4.6)
Potassium: 4.6 mmol/L (ref 3.5–5.1)
Sodium: 128 mmol/L — ABNORMAL LOW (ref 135–145)

## 2022-11-22 LAB — GLUCOSE, CAPILLARY
Glucose-Capillary: 85 mg/dL (ref 70–99)
Glucose-Capillary: 75 mg/dL (ref 70–99)

## 2022-11-22 LAB — BPAM RBC
Blood Product Expiration Date: 202406112359
Blood Product Expiration Date: 202406222359
ISSUE DATE / TIME: 202406041814
Unit Type and Rh: 5100

## 2022-11-22 LAB — CBC
HCT: 17.4 % — ABNORMAL LOW (ref 39.0–52.0)
Hemoglobin: 6.4 g/dL — CL (ref 13.0–17.0)
MCH: 36.8 pg — ABNORMAL HIGH (ref 26.0–34.0)
MCHC: 36.8 g/dL — ABNORMAL HIGH (ref 30.0–36.0)
MCV: 100 fL (ref 80.0–100.0)
Platelets: 113 10*3/uL — ABNORMAL LOW (ref 150–400)
RBC: 1.74 MIL/uL — ABNORMAL LOW (ref 4.22–5.81)
RDW: 22.7 % — ABNORMAL HIGH (ref 11.5–15.5)
WBC: 4.7 10*3/uL (ref 4.0–10.5)
nRBC: 25.2 % — ABNORMAL HIGH (ref 0.0–0.2)

## 2022-11-22 LAB — HEPATITIS B SURFACE ANTIBODY, QUANTITATIVE: Hep B S AB Quant (Post): 581 m[IU]/mL (ref >9.9)

## 2022-11-22 LAB — HEMOGLOBIN AND HEMATOCRIT, BLOOD
HCT: 21.3 % — ABNORMAL LOW (ref 39.0–52.0)
Hemoglobin: 7.8 g/dL — ABNORMAL LOW (ref 13.0–17.0)

## 2022-11-22 MED ORDER — HYDROXYUREA 500 MG PO CAPS
500.0000 mg | ORAL_CAPSULE | Freq: Every day | ORAL | Status: DC
  Administered 2022-11-22 – 2022-11-27 (×6): 500 mg via ORAL
  Filled 2022-11-22 (×6): qty 1

## 2022-11-22 MED ORDER — OXYCODONE HCL 5 MG PO TABS
10.0000 mg | ORAL_TABLET | ORAL | Status: DC | PRN
  Administered 2022-11-23 – 2022-11-27 (×10): 10 mg via ORAL
  Filled 2022-11-22 (×10): qty 2

## 2022-11-22 MED ORDER — IOHEXOL 9 MG/ML PO SOLN
500.0000 mL | ORAL | Status: AC
  Administered 2022-11-22 (×2): 500 mL via ORAL

## 2022-11-22 MED ORDER — NALOXONE HCL 0.4 MG/ML IJ SOLN: 0.4000 mg | INTRAMUSCULAR | Status: DC | PRN

## 2022-11-22 MED ORDER — MIDODRINE HCL 5 MG PO TABS
5.0000 mg | ORAL_TABLET | ORAL | Status: AC
  Administered 2022-11-22: 5 mg via ORAL
  Filled 2022-11-22: qty 1

## 2022-11-22 MED ORDER — HYDROMORPHONE HCL 1 MG/ML IJ SOLN
0.5000 mg | INTRAMUSCULAR | Status: DC | PRN
  Administered 2022-11-22 – 2022-11-24 (×9): 0.5 mg via INTRAVENOUS
  Filled 2022-11-22 (×9): qty 1

## 2022-11-22 NOTE — Progress Notes (Signed)
   11/22/22 1130  Vitals  Temp 97.7 F (36.5 C)  Pulse Rate 88  Resp 20  BP (!) 107/56  SpO2 100 %  O2 Device Room Air  Weight 62.3 kg  Type of Weight Post-Dialysis  Post Treatment  Dialyzer Clearance Lightly streaked  Duration of HD Treatment -hour(s) 408 hour(s)  Hemodialysis Intake (mL) 350 mL  Liters Processed 97  Fluid Removed (mL) 3300 mL  Tolerated HD Treatment Yes  Post-Hemodialysis Comments goal met tolerated well tx completed 1 unit of RBC given  AVG/AVF Arterial Site Held (minutes) 7 minutes  AVG/AVF Venous Site Held (minutes) 7 minutes   Received patient in bed to unit.  Alert and oriented.  Informed consent signed and in chart.   TX duration:4:08  Patient tolerated well.  Transported back to the room  Alert, without acute distress.  Hand-off given to patient's nurse.   Access used: RU A fistula Access issues: none  Total UF removed: 3.3L Medication(s) given: oxy 10mg , PRBC 1 unit Post HD VS: see above Post HD weight: 62.3kg   [facility]

## 2022-11-22 NOTE — Progress Notes (Signed)
[facility]) Hospital Liaison Note  This patient is currently enrolled in [facility] outpatient-based palliative care.  [facility] will continue to follow for discharge disposition.  Please call with any hospice or outpatient palliative care related questions or concerns.  Doreatha Martin, RN [facility] Liaison [phone]

## 2022-11-22 NOTE — Progress Notes (Signed)
Pt receives out-pt HD at [facility] on TTS. Will assist as needed.   Brynn Mulgrew Renal Navigator [phone] 

## 2022-11-22 NOTE — Procedures (Signed)
I was present at this dialysis session. I have reviewed the session itself and made appropriate changes.  Receiving 1 unit PRBC with HD.   Vital signs in last 24 hours:  Temp:  [97.5 F (36.4 C)-98.4 F (36.9 C)] 97.6 F (36.4 C) (06/06 0807) Pulse Rate:  [63-105] 76 (06/06 0830) Resp:  [7-30] 30 (06/06 0830) BP: (92-112)/(59-74) 106/74 (06/06 0830) SpO2:  [95 %-100 %] 100 % (06/06 0830) FiO2 (%):  [41 %] 41 % (06/05 1433) Weight:  [66.1 kg] 66.1 kg (06/06 0623) Weight change: 3.1 kg Filed Weights   11/20/22 1128 11/20/22 2105 11/22/22 0623  Weight: 63 kg 63.8 kg 66.1 kg    Recent Labs  Lab 11/22/22 0630  NA 128*  K 4.6  CL 88*  CO2 24  GLUCOSE 95  BUN 61*  CREATININE 7.80*  CALCIUM 8.1*  PHOS 6.1*    Recent Labs  Lab 11/21/22 0430 11/21/22 1800 11/22/22 0630  WBC 5.3 5.3 4.7  HGB 6.2* 7.1* 6.4*  HCT 16.6* 19.2* 17.4*  MCV 101.2* 103.2* 100.0  PLT 107* 78* 113*    Scheduled Meds:  sodium chloride   Intravenous Once   sodium chloride   Intravenous Once   acetaminophen  650 mg Oral Once   cinacalcet  30 mg Oral Q T,Th,Sa-HD   diphenhydrAMINE  12.5 mg Intravenous Once   doxercalciferol  7 mcg Intravenous Q T,Th,Sa-HD   ferric citrate  420 mg Oral TID WC   And   ferric citrate  210 mg Oral With snacks   folic acid  1 mg Oral Daily   lidocaine-prilocaine  1 Application Topical Q T,Th,Sa-HD   oxyCODONE  10 mg Oral Q12H   pantoprazole  40 mg Oral BID AC   sodium chloride flush  3 mL Intravenous Q12H   torsemide  40 mg Oral BID   Continuous Infusions:  sodium chloride 50 mL/hr at 11/21/22 0830   PRN Meds:.acetaminophen **OR** acetaminophen, albuterol, lactulose, naLOXone (NARCAN)  injection, ondansetron, oxyCODONE   Irena Cords,  MD 11/22/2022, 9:01 AM

## 2022-11-22 NOTE — Progress Notes (Signed)
Upon entry to room for am assessment, pt not in room, had just left to go have hemodialysis.

## 2022-11-22 NOTE — Progress Notes (Addendum)
Date and time results received: 11/22/22 6:59   Test: hemoglobin Critical Value: 6.4  Name of Provider Notified: Dr. Arrie Aran   Orders Received? Or Actions Taken?: 1 unit of blood  already ordered

## 2022-11-22 NOTE — Progress Notes (Signed)
Subjective: Patrick [name]  is a 67 year old male with a medical history significant for sickle cell disease, persistent atrial fibrillation not on anticoagulation due to bleeding, systolic CHF, ESRD on HD (TTS), COPD, sickle cell disease, hypotension, chronic pain syndrome, thrombocytopenia, liver cirrhosis, history of GI bleed secondary to angiodysplasia who was admitted for worsening allover body pain especially bilateral knees. Patient continues to complain of allover body pain rated at 8/10.  He states that pain has improved some since yesterday but not completely.  An IV Dilaudid PCA was initiated on yesterday, discontinued overnight due to decreased respirations. Patient denies any dizziness, shortness of breath, headache, nausea, vomiting, or diarrhea today.  Patient is tolerating diet.    Objective:  Vital signs in last 24 hours:  Vitals:   11/22/22 1100 11/22/22 1130 11/22/22 1151 11/22/22 1358  BP: 105/70 (!) 107/56 108/72 119/82  Pulse: 75 88  61  Resp: 17 20 17 16   Temp:  97.7 F (36.5 C) 98.2 F (36.8 C) 97.8 F (36.6 C)  TempSrc:    Oral  SpO2: 100% 100% 99% 100%  Weight: 62.3 kg 62.3 kg    Height:        Intake/Output from previous day:   Intake/Output Summary (Last 24 hours) at 11/22/2022 1539 Last data filed at 11/22/2022 1400 Gross per 24 hour  Intake 1692.3 ml  Output 3300 ml  Net -1607.7 ml    Physical Exam Constitutional:      Appearance: He is ill-appearing.  Cardiovascular:     Rate and Rhythm: Tachycardia present. Rhythm irregular.  Pulmonary:     Effort: Pulmonary effort is normal.     Breath sounds: Normal breath sounds.  Abdominal:     General: There is abdominal bruit.     Palpations: There is no fluid wave.     Tenderness: There is no abdominal tenderness. There is no guarding.  Musculoskeletal:     Right knee: Swelling present. Decreased range of motion. Tenderness present.     Left knee: Swelling present. Decreased range of motion.  Tenderness present.  Skin:    General: Skin is warm.     Lab Results:  Basic Metabolic Panel:    Component Value Date/Time   NA 128 (L) 11/22/2022 0630   NA 138 08/01/2022 1348   K 4.6 11/22/2022 0630   CL 88 (L) 11/22/2022 0630   CO2 24 11/22/2022 0630   BUN 61 (H) 11/22/2022 0630   BUN 41 (H) 08/01/2022 1348   CREATININE 7.80 (H) 11/22/2022 0630   CREATININE 2.90 (H) 08/11/2015 1359   GLUCOSE 95 11/22/2022 0630   CALCIUM 8.1 (L) 11/22/2022 0630   CBC:    Component Value Date/Time   WBC 4.7 11/22/2022 0630   HGB 6.4 (LL) 11/22/2022 0630   HGB 7.9 (L) 10/30/2022 1228   HCT 17.4 (L) 11/22/2022 0630   HCT 22.1 (L) 10/30/2022 1228   PLT 113 (L) 11/22/2022 0630   PLT 92 (LL) 10/30/2022 1228   MCV 100.0 11/22/2022 0630   MCV 104 (H) 10/30/2022 1228   NEUTROABS 7.5 07/23/2022 1813   NEUTROABS 5.4 11/28/2021 1026   LYMPHSABS 0.2 (L) 07/23/2022 1813   LYMPHSABS 0.4 (L) 11/28/2021 1026   MONOABS 1.8 (H) 07/23/2022 1813   EOSABS 0.0 07/23/2022 1813   EOSABS 0.1 11/28/2021 1026   BASOSABS 0.0 07/23/2022 1813   BASOSABS 0.0 11/28/2021 1026    Recent Results (from the past 240 hour(s))  MRSA Next Gen by PCR, Nasal  Status: None   Collection Time: 11/20/22  9:50 PM   Specimen: Nasal Mucosa; Nasal Swab  Result Value Ref Range Status   MRSA by PCR Next Gen NOT DETECTED NOT DETECTED Final    Comment: (NOTE) The GeneXpert MRSA Assay (FDA approved for NASAL specimens only), is one component of a comprehensive MRSA colonization surveillance program. It is not intended to diagnose MRSA infection nor to guide or monitor treatment for MRSA infections. Test performance is not FDA approved in patients less than 8 years old. Performed at [facility], [address] [place], [place] [address]     Studies/Results: No results found.  Medications: Scheduled Meds:  sodium chloride   Intravenous Once   sodium chloride   Intravenous Once   acetaminophen  650 mg Oral  Once   cinacalcet  30 mg Oral Q T,Th,Sa-HD   diphenhydrAMINE  12.5 mg Intravenous Once   doxercalciferol  7 mcg Intravenous Q T,Th,Sa-HD   ferric citrate  420 mg Oral TID WC   And   ferric citrate  210 mg Oral With snacks   folic acid  1 mg Oral Daily   hydroxyurea  500 mg Oral Daily   lidocaine-prilocaine  1 Application Topical Q T,Th,Sa-HD   oxyCODONE  10 mg Oral Q12H   pantoprazole  40 mg Oral BID AC   sodium chloride flush  3 mL Intravenous Q12H   torsemide  40 mg Oral BID   Continuous Infusions:  sodium chloride 50 mL/hr at 11/21/22 0830   PRN Meds:.acetaminophen **OR** acetaminophen, albuterol, HYDROmorphone (DILAUDID) injection, lactulose, naLOXone (NARCAN)  injection, ondansetron, oxyCODONE  Consultants: Renal  Procedures: none  Antibiotics: none  Assessment/Plan: Principal Problem:   Sickle cell pain crisis (HCC) Active Problems:   Sickle cell crisis (HCC)   Sickle cell disease (HCC)   ESRD on HD TTS   Permanent atrial fibrillation (HCC)   Chronic systolic CHF (congestive heart failure) (HCC)   COPD (chronic obstructive pulmonary disease) (HCC)   Symptomatic anemia   GI bleed   Symptomatic anemia possibly secondary to GI bleed: On admission, hemoglobin 6.3.  Guaiac positive.  Patient is status post 1 unit PRBCs.  Patient may benefit from an additional unit of PRBCs.  Patient received PRBCs and HD this a.m. without complication.  Will follow CBC in AM.  Will continue Protonix.  Awaiting GI consult. Monitor closely.  Labs in AM.  Sickle cell disease with pain crisis: Patient reporting allover body pain that has been worsening overnight.  Will increase frequency of oxycodone 10 mg to every 4 hours as needed. Dilaudid 0.5 mg every 3 hours as needed for severe breakthrough pain. OxyContin 10 mg every 12 hours.  Continue folic acid.  Hydroxyurea restarted today.  ESRD on HD: Patient on TTS schedule.  Last received dialysis on yesterday.  Nephrology will assess  in AM.  Systolic heart failure: Chronic.  Most recent echo shows EF 35 to 40%.  Monitor fluid intake closely.  History of gout, Continue allopurinol  History of COPD.  Patient is a former smoker.  No wheezing at this time.  Albuterol nebulizers as needed for shortness of breath     Code Status: Full Code Family Communication: N/A Disposition Plan: Not yet ready for discharge  Nolon Nations  APRN, MSN, FNP-C [facility] [address] [place], [place] [address] [phone]  If 7PM-7AM, please contact night-coverage.  11/22/2022, 3:39 PM  LOS: 1 day

## 2022-11-22 NOTE — Progress Notes (Addendum)
Patient ID: Patrick [name], male   DOB: 02/12/1956, 67 y.o.   MRN: [id number]    Progress Note   Subjective  Day # 3  CC; anemia, heme positive stool, abdominal pain in patient with end-stage renal disease on dialysis, compensated cirrhosis and sickle cell disease  CT abdomen and pelvis still pending Pt had dialysis this morning  EGD last month for anemia and heme positive stool with finding of moderate gastric erosions and shallow antral ulcerations, biopsies negative for H. Pylori  Labs -LDH elevated at 196 T. bili 1.9/alk phos 194/AST 62/ALT 45  Hemoglobin 6.4 this a.m., transfused with dialysis Sodium 128/potassium 4.6/BUN 61/creatinine 7.8  Patient says his overall body pain is not as bad is on admission but still pretty severe rating it 8 out of 10 currently, abdominal pain a bit better he is asking for regular food    Objective   Vital signs in last 24 hours: Temp:  [97.5 F (36.4 C)-98.4 F (36.9 C)] 98.2 F (36.8 C) (06/06 1151) Pulse Rate:  [63-105] 88 (06/06 1130) Resp:  [7-30] 17 (06/06 1151) BP: (92-114)/(56-74) 108/72 (06/06 1151) SpO2:  [90 %-100 %] 99 % (06/06 1151) FiO2 (%):  [41 %] 41 % (06/05 1433) Weight:  [62.3 kg-66.1 kg] 62.3 kg (06/06 1130) Last BM Date : 11/20/22 General:   older AA male  in NAD Heart:  Regular rate and rhythm; no murmurs Lungs: Respirations even and unlabored, lungs CTA bilaterally Abdomen:  Soft, nondistended, still some tenderness across the upper abdomen.,  No rebound, normal bowel sounds. Extremities:  Without edema. Neurologic:  Alert and oriented,  grossly normal neurologically. Psych:  Cooperative. Normal mood and affect.  Intake/Output from previous day: 06/05 0701 - 06/06 0700 In: 1542.3 [P.O.:1540; I.V.:2.3] Out: 0  Intake/Output this shift: Total I/O In: 350 [Blood:350] Out: 3300 [Other:3300]  Lab Results: Recent Labs    11/21/22 0430 11/21/22 1800 11/22/22 0630  WBC 5.3 5.3 4.7  HGB 6.2* 7.1* 6.4*   HCT 16.6* 19.2* 17.4*  PLT 107* 78* 113*   BMET Recent Labs    11/20/22 1340 11/21/22 0430 11/22/22 0630  NA 138 134* 128*  K 3.3* 4.3 4.6  CL 92* 92* 88*  CO2 29 26 24   GLUCOSE 89 88 95  BUN 45* 51* 61*  CREATININE 5.63* 6.68* 7.80*  CALCIUM 8.8* 7.8* 8.1*   LFT Recent Labs    11/21/22 1800 11/22/22 0630  PROT 7.8  --   ALBUMIN 3.4* 3.0*  AST 62*  --   ALT 45*  --   ALKPHOS 194*  --   BILITOT 1.9*  --   BILIDIR 0.8*  --   IBILI 1.1*  --    PT/INR No results for input(s): "LABPROT", "INR" in the last 72 hours.    Assessment / Plan:    #72 67 year old African-American male admitted postdialysis on 11/20/2022 feeling lethargic and complaining of allover body pain.  Patient has known sickle cell disease and pain is consistent with the pain he gets with sickle cell crises.  He complained of a pounding pain in his back and extremities  He has also complained of upper abdominal pain more right-sided/epigastric over the past week. He is status post cholecystectomy fall 2023 complicated by bile leak, stent placement then removal.  He has had a mild bump in LFTs but I think this is also consistent with sickle cell crisis, may have some sickle hepatopathy . CT abdomen pelvis is pending   #2 anemia, heme  positive stool, no evidence for acute GI bleeding.  He just had EGD last month for similar situation and has known moderate gastric erosions and shallow ulcerations in the gastric antrum So has prior history of intestinal AVMs, certainly may have other AVMs and may always been positive.  He did receive a unit of packed RBCs with dialysis today  #3 end-stage renal disease on dialysis #4 compensated cirrhosis 5.  Atrial fibrillation no anticoagulation 6.  COPD 7.  Legal blindness  Plan; will await CT of the abdomen pelvis results Plans at this time for EGD Continue supportive management for sickle crisis Advance to renal diet Continue twice daily PPI  Principal  Problem:   Sickle cell pain crisis (HCC) Active Problems:   Sickle cell crisis (HCC)   Sickle cell disease (HCC)   ESRD on HD TTS   Permanent atrial fibrillation (HCC)   Chronic systolic CHF (congestive heart failure) (HCC)   COPD (chronic obstructive pulmonary disease) (HCC)   Symptomatic anemia   GI bleed     LOS: 1 day   Amy EsterwoodPA-C  11/22/2022, 1:34 PM   Attending physician's note   I have taken a history, reviewed the chart and examined the patient. I performed a substantive portion of this encounter, including complete performance of at least one of the key components, in conjunction with the APP. I agree with the APP's note, impression and recommendations.   Await CT abdomen pelvis further evaluation of abdominal pain, likely etiology sickle cell crisis Continue supportive care Advance diet as tolerated  Monitor hemoglobin and transfuse as needed  Will hold off repeat EGD, likely will be low yield  The patient was provided an opportunity to ask questions and all were answered. The patient agreed with the plan and demonstrated an understanding of the instructions.   Iona Beard , MD [phone]

## 2022-11-22 NOTE — Progress Notes (Signed)
26 ml PCA Dilaudid wasted in the SteriCycle.  This was witnessed by Guilford Shi RN.  Bernie Covey RN

## 2022-11-22 NOTE — Progress Notes (Signed)
MEWS Progress Note  Patient Details Name: Patrick [name] MRN: [id number] DOB: 08-17-1955 Today's Date: 11/22/2022   MEWS Flowsheet Documentation:  Assess: MEWS Score Temp: 97.6 F (36.4 C) BP: 92/72 MAP (mmHg): 78 Pulse Rate: 71 ECG Heart Rate: 70 Resp: 16 Level of Consciousness: Alert SpO2: 100 % O2 Device: Nasal Cannula Patient Activity (if Appropriate): In bed O2 Flow Rate (L/min): 2 L/min FiO2 (%): 41 % Assess: MEWS Score MEWS Temp: 0 MEWS Systolic: 1 MEWS Pulse: 0 MEWS RR: 0 MEWS LOC: 0 MEWS Score: 1 MEWS Score Color: Green Assess: SIRS CRITERIA SIRS Temperature : 0 SIRS Respirations : 0 SIRS Pulse: 0 SIRS WBC: 0 SIRS Score Sum : 0 SIRS Temperature : 0 SIRS Pulse: 0 SIRS Respirations : 0 SIRS WBC: 0 SIRS Score Sum : 0 Assess: if the MEWS score is Yellow or Red Were vital signs taken at a resting state?: Yes Focused Assessment: Change from prior assessment (see assessment flowsheet) Does the patient meet 2 or more of the SIRS criteria?: Yes Does the patient have a confirmed or suspected source of infection?: No MEWS guidelines implemented : Yes, yellow Treat MEWS Interventions: Considered administering scheduled or prn medications/treatments as ordered Take Vital Signs Increase Vital Sign Frequency : Yellow: Q2hr x1, continue Q4hrs until patient remains green for 12hrs Escalate MEWS: Escalate: Yellow: Discuss with charge nurse and consider notifying provider and/or RRT Notify: Charge Nurse/RN Name of Charge Nurse/RN Notified: Guilford Shi RN Provider Notification Provider Name/Title: Dr. Loney Loh Date Provider Notified: 11/22/22 Time Provider Notified: [id number] Method of Notification: Page Notification Reason: Change in status Provider response: See new orders Date of Provider Response: 11/22/22      Stacie Glaze 11/22/2022, 8:21 AM  During the night, the patient's PCA had multiple alarms and pauses in PCA Dilaudid administration  d/t low respiratory rate and shallow breathing.  He awakens easily and is A &Ox4.  His O2 sats remain in high 90's on RA.  From 1600-2000, used 1.10.  From 2000-MN, used 0.8.  From [id number], used 0.40 but PCA Dilaudid was discontinued per Dr. Gardiner Rhyme order.

## 2022-11-23 ENCOUNTER — Telehealth: Payer: Self-pay | Admitting: *Deleted

## 2022-11-23 DIAGNOSIS — D571 Sickle-cell disease without crisis: Secondary | ICD-10-CM | POA: Diagnosis not present

## 2022-11-23 DIAGNOSIS — D649 Anemia, unspecified: Secondary | ICD-10-CM | POA: Diagnosis not present

## 2022-11-23 DIAGNOSIS — R109 Unspecified abdominal pain: Secondary | ICD-10-CM | POA: Diagnosis not present

## 2022-11-23 DIAGNOSIS — R195 Other fecal abnormalities: Secondary | ICD-10-CM | POA: Diagnosis not present

## 2022-11-23 DIAGNOSIS — K7460 Unspecified cirrhosis of liver: Secondary | ICD-10-CM

## 2022-11-23 DIAGNOSIS — D5700 Hb-SS disease with crisis, unspecified: Secondary | ICD-10-CM | POA: Diagnosis not present

## 2022-11-23 LAB — CBC
HCT: 22.4 % — ABNORMAL LOW (ref 39.0–52.0)
Hemoglobin: 8 g/dL — ABNORMAL LOW (ref 13.0–17.0)
MCH: 36 pg — ABNORMAL HIGH (ref 26.0–34.0)
MCHC: 35.7 g/dL (ref 30.0–36.0)
MCV: 100.9 fL — ABNORMAL HIGH (ref 80.0–100.0)
Platelets: 115 10*3/uL — ABNORMAL LOW (ref 150–400)
RBC: 2.22 MIL/uL — ABNORMAL LOW (ref 4.22–5.81)
RDW: 24.1 % — ABNORMAL HIGH (ref 11.5–15.5)
WBC: 4.3 10*3/uL (ref 4.0–10.5)
nRBC: 17.3 % — ABNORMAL HIGH (ref 0.0–0.2)

## 2022-11-23 LAB — BPAM RBC
Unit Type and Rh: 600
Unit Type and Rh: 600

## 2022-11-23 LAB — LACTATE DEHYDROGENASE: LDH: 171 U/L (ref 98–192)

## 2022-11-23 LAB — TYPE AND SCREEN: Antibody Screen: NEGATIVE

## 2022-11-23 MED ORDER — LIDOCAINE HCL (PF) 1 % IJ SOLN: 5.0000 mL | INTRAMUSCULAR | Status: DC | PRN

## 2022-11-23 MED ORDER — PANTOPRAZOLE SODIUM 40 MG PO TBEC: 40.0000 mg | DELAYED_RELEASE_TABLET | Freq: Every day | ORAL | 2 refills | Status: DC

## 2022-11-23 MED ORDER — ALTEPLASE 2 MG IJ SOLR: 2.0000 mg | Freq: Once | INTRAMUSCULAR | Status: DC | PRN

## 2022-11-23 MED ORDER — HEPARIN SODIUM (PORCINE) 1000 UNIT/ML DIALYSIS: 1000.0000 [IU] | INTRAMUSCULAR | Status: DC | PRN

## 2022-11-23 MED ORDER — CHLORHEXIDINE GLUCONATE CLOTH 2 % EX PADS: 6.0000 | MEDICATED_PAD | Freq: Every day | CUTANEOUS | Status: DC

## 2022-11-23 MED ORDER — PANTOPRAZOLE SODIUM 40 MG PO TBEC: 40.0000 mg | DELAYED_RELEASE_TABLET | Freq: Two times a day (BID) | ORAL | 1 refills | Status: DC

## 2022-11-23 MED ORDER — PENTAFLUOROPROP-TETRAFLUOROETH EX AERO: 1.0000 | INHALATION_SPRAY | CUTANEOUS | Status: DC | PRN

## 2022-11-23 MED ORDER — LIDOCAINE-PRILOCAINE 2.5-2.5 % EX CREA: 1.0000 | TOPICAL_CREAM | CUTANEOUS | Status: DC | PRN

## 2022-11-23 NOTE — Telephone Encounter (Signed)
-----   Message from Sammuel Cooper, PA-C sent at 11/23/2022 11:02 AM EDT ----- Regarding: new rx Patient has been hospitalized, should be discharged this weekend, please send him a new prescription for Protonix 40 mg p.o. twice daily x 2 months, then once daily thereafter-refills x 1 year Thank you

## 2022-11-23 NOTE — Progress Notes (Addendum)
Patient ID: Patrick [name], male   DOB: 05/15/1956, 67 y.o.   MRN: [id number]    Progress Note   Subjective   Day # 3  CC; anemia, heme positive stool, abdominal pain acute in patient with end-stage renal disease on dialysis compensated cirrhosis and sickle cell disease.  CT abdomen and pelvis without IV contrast, small right-sided pleural effusion similar to prior, mildly nodular liver status postcholecystectomy no biliary dilation, unremarkable pancreas, calcified and atrophic spleen, no acute findings  No new labs today.  Patient has tolerated diet advancement, no nausea or vomiting says that his abdominal pain is gradually improving still has complaints of whole body pain also gradually improving Having bowel movements, had been somewhat constipated at home and was using as needed lactulose, will try MiraLAX here       Objective   Vital signs in last 24 hours: Temp:  [97.5 F (36.4 C)-98.5 F (36.9 C)] 97.8 F (36.6 C) (06/07 0800) Pulse Rate:  [61-96] 77 (06/07 0800) Resp:  [16-20] 18 (06/07 0800) BP: (88-119)/(50-82) 90/50 (06/07 0833) SpO2:  [98 %-100 %] 100 % (06/07 0800) Weight:  [62.3 kg] 62.3 kg (06/06 1130) Last BM Date : 11/20/22 General:    Older African-American male in NAD does not Heart:  Regular rate and rhythm; no murmurs Lungs: Respirations even and unlabored, lungs CTA bilaterally Abdomen:  Soft, mild tenderness in the right upper quadrant across the upper abdomen no guarding or rebound normal bowel sounds. Extremities:  Without edema. Neurologic:  Alert and oriented,  grossly normal neurologically. Psych:  Cooperative. Normal mood and affect.  Intake/Output from previous day: 06/06 0701 - 06/07 0700 In: 1190 [P.O.:840; Blood:350] Out: 3302 [Urine:1; Stool:1] Intake/Output this shift: No intake/output data recorded.  Lab Results: Recent Labs    11/21/22 0430 11/21/22 1800 11/22/22 0630 11/22/22 1618  WBC 5.3 5.3 4.7  --   HGB 6.2* 7.1*  6.4* 7.8*  HCT 16.6* 19.2* 17.4* 21.3*  PLT 107* 78* 113*  --    BMET Recent Labs    11/20/22 1340 11/21/22 0430 11/22/22 0630  NA 138 134* 128*  K 3.3* 4.3 4.6  CL 92* 92* 88*  CO2 29 26 24   GLUCOSE 89 88 95  BUN 45* 51* 61*  CREATININE 5.63* 6.68* 7.80*  CALCIUM 8.8* 7.8* 8.1*   LFT Recent Labs    11/21/22 1800 11/22/22 0630  PROT 7.8  --   ALBUMIN 3.4* 3.0*  AST 62*  --   ALT 45*  --   ALKPHOS 194*  --   BILITOT 1.9*  --   BILIDIR 0.8*  --   IBILI 1.1*  --    PT/INR No results for input(s): "LABPROT", "INR" in the last 72 hours.  Studies/Results: CT ABDOMEN PELVIS WO CONTRAST  Result Date: 11/22/2022 CLINICAL DATA:  Acute abdominal pain on the right, initial encounter EXAM: CT ABDOMEN AND PELVIS WITHOUT CONTRAST TECHNIQUE: Multidetector CT imaging of the abdomen and pelvis was performed following the standard protocol without IV contrast. RADIATION DOSE REDUCTION: This exam was performed according to the departmental dose-optimization program which includes automated exposure control, adjustment of the mA and/or kV according to patient size and/or use of iterative reconstruction technique. COMPARISON:  10/20/2022 FINDINGS: Lower chest: Small right-sided pleural effusion is noted similar to that seen on the prior exam. Heart remains enlarged in size. Hepatobiliary: Liver demonstrates mild nodularity. Status post cholecystectomy. No biliary dilatation. Pancreas: Unremarkable. No pancreatic ductal dilatation or surrounding inflammatory changes. Spleen: Spleen is  calcified and atrophic but stable from the prior exam. This is consistent with auto infarction. Adrenals/Urinary Tract: Adrenal glands are within normal limits. Kidneys are well visualized bilaterally. Renal vascular calcifications are seen. No calculi are noted. Small cysts are seen. No follow-up is recommended. The bladder is decompressed. Stomach/Bowel: The appendix is within normal limits. Contrast is noted scattered  throughout the colon without obstructive change. Stomach and small bowel are within normal limits. Vascular/Lymphatic: Aortic atherosclerosis. No enlarged abdominal or pelvic lymph nodes. Reproductive: Prostate is unremarkable. Other: Ascites is noted primarily within the lesser sac but also left upper quadrant similar to that seen on the prior exam. No focal hernia is seen. Musculoskeletal: Sclerotic changes of the bones are noted consistent with the known history of sickle cell disease. IMPRESSION: Stable appearing ascites. Small right-sided pleural effusion stable from the prior study. Early cirrhosis of the liver. Electronically Signed   By: Alcide Clever M.D.   On: 11/22/2022 22:19       Assessment / Plan:    #53 67 year old male admitted 3 days ago postdialysis on 11/20/2022 feeling lethargic and complaining of allover body pain. Patient has known sickle cell disease and pain is consistent with sickle cell crisis. Had also complained of upper abdominal pain which had been present for about a week prior to admission progressive and primarily feeling in the right upper quadrant. He is status post cholecystectomy. CT of the abdomen pelvis without contrast no acute findings, mildly nodular liver, auto infarcted spleen, no ductal dilation  Believe his acute abdominal pain has been secondary to sickle cell crisis as well.  They have had some sickle induced hepatopathy  #2 anemia heme positive stool, no acute GI bleeding. Had EGD in May 2024 with similar symptoms and has moderate gastric erosions and shallow ulcerations in the gastric antrum. He also has history of intestinal AVMs, probably has intermittent chronic GI blood loss and may always have heme positive stool  #3 end-stage renal disease on dialysis 4.  Compensated cirrhosis 5.  COPD 6.  Legal blindness 7 atrial fibrillation, no anticoagulation  Plan; regular diet  Please discharge on twice daily PPI x 8 weeks then once daily  thereafter-patient states he needs a new prescription GI will sign off, available if needed, hopefully he will be able to be discharged soon   Principal Problem:   Sickle cell pain crisis (HCC) Active Problems:   Sickle cell crisis (HCC)   Sickle cell disease (HCC)   ESRD on hemodialysis (HCC)   Permanent atrial fibrillation (HCC)   Chronic systolic CHF (congestive heart failure) (HCC)   COPD (chronic obstructive pulmonary disease) (HCC)   Symptomatic anemia   GI bleed     LOS: 2 days   Amy Esterwood PA-C  11/23/2022, 10:54 AM   Attending physician's note   I have taken a history, reviewed the chart and examined the patient. I performed a substantive portion of this encounter, including complete performance of at least one of the key components, in conjunction with the APP. I agree with the APP's note, impression and recommendations.    CT abdomen pelvis without contrast negative for acute pathology.  He has chronic calcified and atrophic spleen, nodular liver consistent with cirrhosis and mild ascites.  He is status postcholecystectomy with no biliary dilation.  Pancreas was unremarkable. He is tolerating regular diet No evidence of overt GI bleeding  Presentation is consistent with acute sickle cell crisis Continue supportive care  GI will sign off, available if have  any questions The patient was provided an opportunity to ask questions and all were answered. The patient agreed with the plan and demonstrated an understanding of the instructions.   Iona Beard , MD [phone]

## 2022-11-23 NOTE — Progress Notes (Signed)
[facility] Progress Note   Subjective:    Seen and examined patient at bedside. Tolerated yesterday's HD with net UF 3.3L. S/p 1 unit PRBCs yesterday for Hgb 6.4. Hgb now is 7.8. He reports bilateral leg pain and PRN pain medication recently given. Next HD 6/8.  Objective Vitals:   11/23/22 0026 11/23/22 0543 11/23/22 0800 11/23/22 0833  BP: 92/64 96/65 (!) 89/61 (!) 90/50  Pulse: 86 83 77   Resp:  18 18   Temp:  (!) 97.5 F (36.4 C) 97.8 F (36.6 C)   TempSrc:  Oral Oral   SpO2:  100% 100%   Weight:      Height:       Physical Exam General: Awake, alert, on RA, NAD Heart:Irregular, No murmurs, gallops, or rubs Lungs: Clear anteriorly and laterally Abdomen: Soft; unable to palpate 2nd generalized pain Extremities:No LE edema Dialysis Access: R AVF; aneurysmal;  (+) B/T  Filed Weights   11/22/22 0623 11/22/22 1100 11/22/22 1130  Weight: 66.1 kg 62.3 kg 62.3 kg    Intake/Output Summary (Last 24 hours) at 11/23/2022 0930 Last data filed at 11/22/2022 1700 Gross per 24 hour  Intake 840 ml  Output 3302 ml  Net -2462 ml    Additional Objective Labs: Basic Metabolic Panel: Recent Labs  Lab 11/20/22 1340 11/21/22 0430 11/22/22 0630  NA 138 134* 128*  K 3.3* 4.3 4.6  CL 92* 92* 88*  CO2 29 26 24   GLUCOSE 89 88 95  BUN 45* 51* 61*  CREATININE 5.63* 6.68* 7.80*  CALCIUM 8.8* 7.8* 8.1*  PHOS  --   --  6.1*   Liver Function Tests: Recent Labs  Lab 11/20/22 1330 11/21/22 1800 11/22/22 0630  AST 61* 62*  --   ALT 40 45*  --   ALKPHOS 230* 194*  --   BILITOT 2.2* 1.9*  --   PROT 7.9 7.8  --   ALBUMIN 3.6 3.4* 3.0*   Recent Labs  Lab 11/20/22 1408  LIPASE 33   CBC: Recent Labs  Lab 11/20/22 1340 11/21/22 0430 11/21/22 1800 11/22/22 0630 11/22/22 1618  WBC 6.6 5.3 5.3 4.7  --   HGB 6.3* 6.2* 7.1* 6.4* 7.8*  HCT 17.1* 16.6* 19.2* 17.4* 21.3*  MCV 104.3* 101.2* 103.2* 100.0  --   PLT 118* 107* 78* 113*  --    Blood Culture     Component Value Date/Time   SDES EXPECTORATED SPUTUM 07/25/2022 0800   SPECREQUEST NONE 07/25/2022 0800   CULT  07/24/2022 0053    NO GROWTH 5 DAYS Performed at [facility], [address], [place], [place] [address]    REPTSTATUS 07/25/2022 FINAL 07/25/2022 0800    Cardiac Enzymes: No results for input(s): "CKTOTAL", "CKMB", "CKMBINDEX", "TROPONINI" in the last 168 hours. CBG: Recent Labs  Lab 11/22/22 0535 11/22/22 1624  GLUCAP 85 75   Iron Studies: No results for input(s): "IRON", "TIBC", "TRANSFERRIN", "FERRITIN" in the last 72 hours. Lab Results  Component Value Date   INR 1.5 (H) 10/21/2022   INR 1.3 (H) 06/04/2022   INR 1.3 (H) 05/17/2022   Studies/Results: CT ABDOMEN PELVIS WO CONTRAST  Result Date: 11/22/2022 CLINICAL DATA:  Acute abdominal pain on the right, initial encounter EXAM: CT ABDOMEN AND PELVIS WITHOUT CONTRAST TECHNIQUE: Multidetector CT imaging of the abdomen and pelvis was performed following the standard protocol without IV contrast. RADIATION DOSE REDUCTION: This exam was performed according to the departmental dose-optimization program which includes automated exposure control,  adjustment of the mA and/or kV according to patient size and/or use of iterative reconstruction technique. COMPARISON:  10/20/2022 FINDINGS: Lower chest: Small right-sided pleural effusion is noted similar to that seen on the prior exam. Heart remains enlarged in size. Hepatobiliary: Liver demonstrates mild nodularity. Status post cholecystectomy. No biliary dilatation. Pancreas: Unremarkable. No pancreatic ductal dilatation or surrounding inflammatory changes. Spleen: Spleen is calcified and atrophic but stable from the prior exam. This is consistent with auto infarction. Adrenals/Urinary Tract: Adrenal glands are within normal limits. Kidneys are well visualized bilaterally. Renal vascular calcifications are seen. No calculi are noted. Small cysts are seen. No follow-up is  recommended. The bladder is decompressed. Stomach/Bowel: The appendix is within normal limits. Contrast is noted scattered throughout the colon without obstructive change. Stomach and small bowel are within normal limits. Vascular/Lymphatic: Aortic atherosclerosis. No enlarged abdominal or pelvic lymph nodes. Reproductive: Prostate is unremarkable. Other: Ascites is noted primarily within the lesser sac but also left upper quadrant similar to that seen on the prior exam. No focal hernia is seen. Musculoskeletal: Sclerotic changes of the bones are noted consistent with the known history of sickle cell disease. IMPRESSION: Stable appearing ascites. Small right-sided pleural effusion stable from the prior study. Early cirrhosis of the liver. Electronically Signed   By: Alcide Clever M.D.   On: 11/22/2022 22:19    Medications:  sodium chloride 50 mL/hr at 11/21/22 0830    sodium chloride   Intravenous Once   sodium chloride   Intravenous Once   acetaminophen  650 mg Oral Once   cinacalcet  30 mg Oral Q T,Th,Sa-HD   diphenhydrAMINE  12.5 mg Intravenous Once   doxercalciferol  7 mcg Intravenous Q T,Th,Sa-HD   ferric citrate  420 mg Oral TID WC   And   ferric citrate  210 mg Oral With snacks   folic acid  1 mg Oral Daily   hydroxyurea  500 mg Oral Daily   lidocaine-prilocaine  1 Application Topical Q T,Th,Sa-HD   oxyCODONE  10 mg Oral Q12H   pantoprazole  40 mg Oral BID AC   sodium chloride flush  3 mL Intravenous Q12H   torsemide  40 mg Oral BID    Dialysis Orders: AF, T,Th,S 4 hr 15 min 180NRe 400/500 60.5 kg 2.0 K/ 2.0 Ca UFP 2 AVF - No Heparin - Sensipar 30 mg PO TIW - Hectorol 7 mcg IV TIW - Mircera 225 mcg IV 11/13/2022 Next dose due 11/27/2022   Assessment/Plan: Sickle Cell Crisis-management per primary Symptomatic Anemia- W/U per primary. + stools. Seen by GI, no endoscopic W/U planned. S/p CT ABD/pelvis: stable ascites, stable r-sided pleural effusion, and early cirrhosis. S/p 1  unit PRBCs 6/6 for Hgb 6.4. Hgb now 7.8.  Persistent Afib-rate controlled. No AC D/T GIB  ESRD -  T,Th,S. Next HD 6/8.  Hypertension/volume  - He is over OP EDW with mild JVD, denies SOB. CXR unremarkable. Attempt to lower to OP EDW. Continue Torsemide 40 mg PO BID.  Anemia  - As noted above. Metabolic bone disease -  Continue cinacalcet, Auryxia binders, hectorol.  Nutrition - Full liquids at present. Renal diet with fluid restrictions Albumin 3.8 11/01/2022 (OP clinic)  Chronic pain on opoid therapy- lactulose PRN constipation. Management per primary.  Salome Holmes, NP [facility] 11/23/2022,9:30 AM  LOS: 2 days

## 2022-11-23 NOTE — Telephone Encounter (Signed)
Rx sent 

## 2022-11-23 NOTE — Progress Notes (Signed)
Subjective: Patrick [name]  is a 67 year old male with a medical history significant for sickle cell disease, persistent atrial fibrillation not on anticoagulation due to bleeding, systolic CHF, ESRD on HD (TTS), COPD, sickle cell disease, hypotension, chronic pain syndrome, thrombocytopenia, liver cirrhosis, history of GI bleed secondary to angiodysplasia who was admitted for worsening allover body pain especially bilateral knees. Patient continues to complain of allover body pain rated at 8/10.  Patient states that pain has been essentially unchanged since yesterday.  He has not had very good pain control since admission.  Blood pressures running soft, unable to maintain IV Dilaudid PCA.  Patient denies any dizziness, shortness of breath, headache, nausea, vomiting, or diarrhea today.  Patient is tolerating diet.    Objective:  Vital signs in last 24 hours:  Vitals:   11/23/22 0026 11/23/22 0543 11/23/22 0800 11/23/22 0833  BP: 92/64 96/65 (!) 89/61 (!) 90/50  Pulse: 86 83 77   Resp:  18 18   Temp:  (!) 97.5 F (36.4 C) 97.8 F (36.6 C)   TempSrc:  Oral Oral   SpO2:  100% 100%   Weight:      Height:        Intake/Output from previous day:   Intake/Output Summary (Last 24 hours) at 11/23/2022 1120 Last data filed at 11/22/2022 1700 Gross per 24 hour  Intake 840 ml  Output 3302 ml  Net -2462 ml    Physical Exam Constitutional:      Appearance: He is ill-appearing.  Cardiovascular:     Rate and Rhythm: Tachycardia present. Rhythm irregular.  Pulmonary:     Effort: Pulmonary effort is normal.     Breath sounds: Normal breath sounds.  Abdominal:     General: There is abdominal bruit.     Palpations: There is no fluid wave.     Tenderness: There is no abdominal tenderness. There is no guarding.  Musculoskeletal:     Right knee: Swelling present. Decreased range of motion. Tenderness present.     Left knee: Swelling present. Decreased range of motion. Tenderness present.   Skin:    General: Skin is warm.     Lab Results:  Basic Metabolic Panel:    Component Value Date/Time   NA 128 (L) 11/22/2022 0630   NA 138 08/01/2022 1348   K 4.6 11/22/2022 0630   CL 88 (L) 11/22/2022 0630   CO2 24 11/22/2022 0630   BUN 61 (H) 11/22/2022 0630   BUN 41 (H) 08/01/2022 1348   CREATININE 7.80 (H) 11/22/2022 0630   CREATININE 2.90 (H) 08/11/2015 1359   GLUCOSE 95 11/22/2022 0630   CALCIUM 8.1 (L) 11/22/2022 0630   CBC:    Component Value Date/Time   WBC 4.7 11/22/2022 0630   HGB 7.8 (L) 11/22/2022 1618   HGB 7.9 (L) 10/30/2022 1228   HCT 21.3 (L) 11/22/2022 1618   HCT 22.1 (L) 10/30/2022 1228   PLT 113 (L) 11/22/2022 0630   PLT 92 (LL) 10/30/2022 1228   MCV 100.0 11/22/2022 0630   MCV 104 (H) 10/30/2022 1228   NEUTROABS 7.5 07/23/2022 1813   NEUTROABS 5.4 11/28/2021 1026   LYMPHSABS 0.2 (L) 07/23/2022 1813   LYMPHSABS 0.4 (L) 11/28/2021 1026   MONOABS 1.8 (H) 07/23/2022 1813   EOSABS 0.0 07/23/2022 1813   EOSABS 0.1 11/28/2021 1026   BASOSABS 0.0 07/23/2022 1813   BASOSABS 0.0 11/28/2021 1026    Recent Results (from the past 240 hour(s))  MRSA Next Gen by PCR,  Nasal     Status: None   Collection Time: 11/20/22  9:50 PM   Specimen: Nasal Mucosa; Nasal Swab  Result Value Ref Range Status   MRSA by PCR Next Gen NOT DETECTED NOT DETECTED Final    Comment: (NOTE) The GeneXpert MRSA Assay (FDA approved for NASAL specimens only), is one component of a comprehensive MRSA colonization surveillance program. It is not intended to diagnose MRSA infection nor to guide or monitor treatment for MRSA infections. Test performance is not FDA approved in patients less than 67 years old. Performed at [facility], [address], [place], [place] [address]     Studies/Results: CT ABDOMEN PELVIS WO CONTRAST  Result Date: 11/22/2022 CLINICAL DATA:  Acute abdominal pain on the right, initial encounter EXAM: CT ABDOMEN AND PELVIS WITHOUT CONTRAST  TECHNIQUE: Multidetector CT imaging of the abdomen and pelvis was performed following the standard protocol without IV contrast. RADIATION DOSE REDUCTION: This exam was performed according to the departmental dose-optimization program which includes automated exposure control, adjustment of the mA and/or kV according to patient size and/or use of iterative reconstruction technique. COMPARISON:  10/20/2022 FINDINGS: Lower chest: Small right-sided pleural effusion is noted similar to that seen on the prior exam. Heart remains enlarged in size. Hepatobiliary: Liver demonstrates mild nodularity. Status post cholecystectomy. No biliary dilatation. Pancreas: Unremarkable. No pancreatic ductal dilatation or surrounding inflammatory changes. Spleen: Spleen is calcified and atrophic but stable from the prior exam. This is consistent with auto infarction. Adrenals/Urinary Tract: Adrenal glands are within normal limits. Kidneys are well visualized bilaterally. Renal vascular calcifications are seen. No calculi are noted. Small cysts are seen. No follow-up is recommended. The bladder is decompressed. Stomach/Bowel: The appendix is within normal limits. Contrast is noted scattered throughout the colon without obstructive change. Stomach and small bowel are within normal limits. Vascular/Lymphatic: Aortic atherosclerosis. No enlarged abdominal or pelvic lymph nodes. Reproductive: Prostate is unremarkable. Other: Ascites is noted primarily within the lesser sac but also left upper quadrant similar to that seen on the prior exam. No focal hernia is seen. Musculoskeletal: Sclerotic changes of the bones are noted consistent with the known history of sickle cell disease. IMPRESSION: Stable appearing ascites. Small right-sided pleural effusion stable from the prior study. Early cirrhosis of the liver. Electronically Signed   By: Alcide Clever M.D.   On: 11/22/2022 22:19    Medications: Scheduled Meds:  sodium chloride   Intravenous  Once   sodium chloride   Intravenous Once   acetaminophen  650 mg Oral Once   cinacalcet  30 mg Oral Q T,Th,Sa-HD   diphenhydrAMINE  12.5 mg Intravenous Once   doxercalciferol  7 mcg Intravenous Q T,Th,Sa-HD   ferric citrate  420 mg Oral TID WC   And   ferric citrate  210 mg Oral With snacks   folic acid  1 mg Oral Daily   hydroxyurea  500 mg Oral Daily   lidocaine-prilocaine  1 Application Topical Q T,Th,Sa-HD   oxyCODONE  10 mg Oral Q12H   pantoprazole  40 mg Oral BID AC   sodium chloride flush  3 mL Intravenous Q12H   torsemide  40 mg Oral BID   Continuous Infusions:  sodium chloride 50 mL/hr at 11/21/22 0830   PRN Meds:.acetaminophen **OR** acetaminophen, albuterol, HYDROmorphone (DILAUDID) injection, lactulose, naLOXone (NARCAN)  injection, ondansetron, oxyCODONE  Consultants: Renal  Procedures: none  Antibiotics: none  Assessment/Plan: Principal Problem:   Sickle cell pain crisis (HCC) Active Problems:   Sickle  cell crisis (HCC)   Sickle cell disease (HCC)   ESRD on hemodialysis (HCC)   Permanent atrial fibrillation (HCC)   Chronic systolic CHF (congestive heart failure) (HCC)   COPD (chronic obstructive pulmonary disease) (HCC)   Symptomatic anemia   GI bleed   Symptomatic anemia possibly secondary to GI bleed: On admission, hemoglobin 6.3.  Guaiac positive.  Patient is status post 1 unit PRBCs on yesterday and HD.   without complication.  Will follow CBC in AM.  Will continue Protonix.  No GI bleed.  Labs in AM.  Sickle cell disease with pain crisis: Patient reporting allover body pain that has been worsening overnight.  Continue oxycodone 10 mg to every 4 hours as needed. Dilaudid 0.5 mg every 3 hours as needed for severe breakthrough pain. OxyContin 10 mg every 12 hours.  Continue folic acid.  Hydroxyurea restarted today.  ESRD on HD: Patient on TTS schedule.  Last received dialysis on yesterday.  Nephrology will assess in AM.  Systolic heart  failure: Chronic.  Most recent echo shows EF 35 to 40%.  Monitor fluid intake closely.  History of gout, Continue allopurinol  History of COPD.  Patient is a former smoker.  No wheezing at this time.  Albuterol nebulizers as needed for shortness of breath     Code Status: Full Code Family Communication: N/A Disposition Plan: Not yet ready for discharge  Nolon Nations  APRN, MSN, FNP-C [facility] [address] [place], [place] [address] [phone]  If 7PM-7AM, please contact night-coverage.  11/23/2022, 11:20 AM  LOS: 2 days

## 2022-11-24 DIAGNOSIS — D5700 Hb-SS disease with crisis, unspecified: Secondary | ICD-10-CM | POA: Diagnosis not present

## 2022-11-24 LAB — CBC
HCT: 19 % — ABNORMAL LOW (ref 39.0–52.0)
Hemoglobin: 6.8 g/dL — CL (ref 13.0–17.0)
MCH: 35.2 pg — ABNORMAL HIGH (ref 26.0–34.0)
MCHC: 35.8 g/dL (ref 30.0–36.0)
MCV: 98.4 fL (ref 80.0–100.0)
Platelets: 109 10*3/uL — ABNORMAL LOW (ref 150–400)
RBC: 1.93 MIL/uL — ABNORMAL LOW (ref 4.22–5.81)
RDW: 23.1 % — ABNORMAL HIGH (ref 11.5–15.5)
WBC: 4.4 10*3/uL (ref 4.0–10.5)
nRBC: 7.7 % — ABNORMAL HIGH (ref 0.0–0.2)

## 2022-11-24 LAB — TYPE AND SCREEN
Unit division: 0
Unit division: 0

## 2022-11-24 LAB — BPAM RBC
Blood Product Expiration Date: 202407082359
Blood Product Expiration Date: 202406222359
ISSUE DATE / TIME: 202406060738
ISSUE DATE / TIME: 202406080821

## 2022-11-24 LAB — RENAL FUNCTION PANEL
Albumin: 2.9 g/dL — ABNORMAL LOW (ref 3.5–5.0)
Anion gap: 14 (ref 5–15)
BUN: 42 mg/dL — ABNORMAL HIGH (ref 8–23)
CO2: 24 mmol/L (ref 22–32)
Calcium: 8.3 mg/dL — ABNORMAL LOW (ref 8.9–10.3)
Chloride: 88 mmol/L — ABNORMAL LOW (ref 98–111)
Creatinine, Ser: 7.21 mg/dL — ABNORMAL HIGH (ref 0.61–1.24)
GFR, Estimated: 8 mL/min — ABNORMAL LOW (ref >60)
Glucose, Bld: 92 mg/dL (ref 70–99)
Phosphorus: 4.2 mg/dL (ref 2.5–4.6)
Potassium: 4.2 mmol/L (ref 3.5–5.1)
Sodium: 126 mmol/L — ABNORMAL LOW (ref 135–145)

## 2022-11-24 LAB — GLUCOSE, CAPILLARY: Glucose-Capillary: 79 mg/dL (ref 70–99)

## 2022-11-24 LAB — PREPARE RBC (CROSSMATCH)

## 2022-11-24 LAB — HEMOGLOBIN AND HEMATOCRIT, BLOOD
HCT: 24.8 % — ABNORMAL LOW (ref 39.0–52.0)
Hemoglobin: 8.7 g/dL — ABNORMAL LOW (ref 13.0–17.0)

## 2022-11-24 MED ORDER — HYDROMORPHONE HCL 1 MG/ML IJ SOLN
1.0000 mg | INTRAMUSCULAR | Status: DC | PRN
  Administered 2022-11-24 – 2022-11-27 (×13): 1 mg via INTRAVENOUS
  Filled 2022-11-24 (×13): qty 1

## 2022-11-24 MED ORDER — LIDOCAINE-PRILOCAINE 2.5-2.5 % EX CREA: 1.0000 | TOPICAL_CREAM | CUTANEOUS | Status: DC | PRN

## 2022-11-24 MED ORDER — LIDOCAINE HCL (PF) 1 % IJ SOLN: 5.0000 mL | INTRAMUSCULAR | Status: DC | PRN

## 2022-11-24 MED ORDER — PENTAFLUOROPROP-TETRAFLUOROETH EX AERO: 1.0000 | INHALATION_SPRAY | CUTANEOUS | Status: DC | PRN

## 2022-11-24 MED ORDER — SODIUM CHLORIDE 0.9% IV SOLUTION: Freq: Once | INTRAVENOUS | Status: DC

## 2022-11-24 NOTE — Progress Notes (Addendum)
Addendum to below note: pt also received 1 unit PRBC  POST HD TX NOTE  11/24/22 1128  Vitals  Temp 97.8 F (36.6 C)  Temp Source Oral  BP (!) 110/59  MAP (mmHg) 74  BP Location Left Arm  BP Method Automatic  Patient Position (if appropriate) Lying  Pulse Rate 74  Pulse Rate Source Monitor  ECG Heart Rate 74  Resp 12  Oxygen Therapy  SpO2 97 %  O2 Device Room Air  Pulse Oximetry Type Continuous  During Treatment Monitoring  Intra-Hemodialysis Comments (S)   (post HD tx VS check)  Post Treatment  Dialyzer Clearance Lightly streaked  Duration of HD Treatment -hour(s) 3.5 hour(s)  Hemodialysis Intake (mL) 365 mL (PRBC + NS line flush 50ml = )  Liters Processed 84  Fluid Removed (mL) 2035 mL ( (value from machine) - ( PRBC and NS line flush ) = )  Tolerated HD Treatment Yes  Post-Hemodialysis Comments (S)  tx completed w/o problem, UF goal met, blood rinsed back, VSS. Medication Admin: Hectorol 7mg  IVP  AVG/AVF Arterial Site Held (minutes) 5 minutes  AVG/AVF Venous Site Held (minutes) 4 minutes  Fistula / Graft Right Upper arm Arteriovenous fistula  Placement Date/Time: 08/01/21 0354   Placed prior to admission: Yes  Orientation: Right  Access Location: Upper arm  Access Type: Arteriovenous fistula  Site Condition No complications  Fistula / Graft Assessment Bruit;Thrill;Present  Drainage Description None

## 2022-11-24 NOTE — Progress Notes (Signed)
[facility] Progress Note   Subjective:    Seen and examined patient on HD. Recently completed 1 unit PRBC for Hgb 6.8 earlier this AM. Tolerating UFG 2.4L.   Objective Vitals:   11/24/22 0829 11/24/22 0835 11/24/22 0836 11/24/22 0851  BP: 94/63  98/63 100/62  Pulse: 65  75 69  Resp: (!) 8  (!) 8 14  Temp:   97.9 F (36.6 C) 97.7 F (36.5 C)  TempSrc:  Oral Oral Oral  SpO2: 100%  98% 98%  Weight:      Height:       Physical Exam General: Awake, alert, on RA, NAD Heart:Irregular, No murmurs, gallops, or rubs Lungs: Clear anteriorly and laterally Abdomen: Soft and non-tender Extremities:No LE edema Dialysis Access: R AVF; aneurysmal;  (+) B/T  Filed Weights   11/22/22 1130 11/24/22 0735 11/24/22 0744  Weight: 62.3 kg 67 kg 67 kg    Intake/Output Summary (Last 24 hours) at 11/24/2022 0932 Last data filed at 11/24/2022 0600 Gross per 24 hour  Intake 240 ml  Output 0 ml  Net 240 ml    Additional Objective Labs: Basic Metabolic Panel: Recent Labs  Lab 11/21/22 0430 11/22/22 0630 11/24/22 0744  NA 134* 128* 126*  K 4.3 4.6 4.2  CL 92* 88* 88*  CO2 26 24 24   GLUCOSE 88 95 92  BUN 51* 61* 42*  CREATININE 6.68* 7.80* 7.21*  CALCIUM 7.8* 8.1* 8.3*  PHOS  --  6.1* 4.2   Liver Function Tests: Recent Labs  Lab 11/20/22 1330 11/21/22 1800 11/22/22 0630 11/24/22 0744  AST 61* 62*  --   --   ALT 40 45*  --   --   ALKPHOS 230* 194*  --   --   BILITOT 2.2* 1.9*  --   --   PROT 7.9 7.8  --   --   ALBUMIN 3.6 3.4* 3.0* 2.9*   Recent Labs  Lab 11/20/22 1408  LIPASE 33   CBC: Recent Labs  Lab 11/21/22 0430 11/21/22 1800 11/22/22 0630 11/22/22 1618 11/23/22 1241 11/24/22 0301  WBC 5.3 5.3 4.7  --  4.3 4.4  HGB 6.2* 7.1* 6.4* 7.8* 8.0* 6.8*  HCT 16.6* 19.2* 17.4* 21.3* 22.4* 19.0*  MCV 101.2* 103.2* 100.0  --  100.9* 98.4  PLT 107* 78* 113*  --  115* 109*   Blood Culture    Component Value Date/Time   SDES EXPECTORATED SPUTUM  07/25/2022 0800   SPECREQUEST NONE 07/25/2022 0800   CULT  07/24/2022 0053    NO GROWTH 5 DAYS Performed at [facility], [address] [place], [place] [address]    REPTSTATUS 07/25/2022 FINAL 07/25/2022 0800    Cardiac Enzymes: No results for input(s): "CKTOTAL", "CKMB", "CKMBINDEX", "TROPONINI" in the last 168 hours. CBG: Recent Labs  Lab 11/22/22 0535 11/22/22 1624  GLUCAP 85 75   Iron Studies: No results for input(s): "IRON", "TIBC", "TRANSFERRIN", "FERRITIN" in the last 72 hours. Lab Results  Component Value Date   INR 1.5 (H) 10/21/2022   INR 1.3 (H) 06/04/2022   INR 1.3 (H) 05/17/2022   Studies/Results: CT ABDOMEN PELVIS WO CONTRAST  Result Date: 11/22/2022 CLINICAL DATA:  Acute abdominal pain on the right, initial encounter EXAM: CT ABDOMEN AND PELVIS WITHOUT CONTRAST TECHNIQUE: Multidetector CT imaging of the abdomen and pelvis was performed following the standard protocol without IV contrast. RADIATION DOSE REDUCTION: This exam was performed according to the departmental dose-optimization program which includes automated exposure control,  adjustment of the mA and/or kV according to patient size and/or use of iterative reconstruction technique. COMPARISON:  10/20/2022 FINDINGS: Lower chest: Small right-sided pleural effusion is noted similar to that seen on the prior exam. Heart remains enlarged in size. Hepatobiliary: Liver demonstrates mild nodularity. Status post cholecystectomy. No biliary dilatation. Pancreas: Unremarkable. No pancreatic ductal dilatation or surrounding inflammatory changes. Spleen: Spleen is calcified and atrophic but stable from the prior exam. This is consistent with auto infarction. Adrenals/Urinary Tract: Adrenal glands are within normal limits. Kidneys are well visualized bilaterally. Renal vascular calcifications are seen. No calculi are noted. Small cysts are seen. No follow-up is recommended. The bladder is decompressed. Stomach/Bowel: The  appendix is within normal limits. Contrast is noted scattered throughout the colon without obstructive change. Stomach and small bowel are within normal limits. Vascular/Lymphatic: Aortic atherosclerosis. No enlarged abdominal or pelvic lymph nodes. Reproductive: Prostate is unremarkable. Other: Ascites is noted primarily within the lesser sac but also left upper quadrant similar to that seen on the prior exam. No focal hernia is seen. Musculoskeletal: Sclerotic changes of the bones are noted consistent with the known history of sickle cell disease. IMPRESSION: Stable appearing ascites. Small right-sided pleural effusion stable from the prior study. Early cirrhosis of the liver. Electronically Signed   By: Alcide Clever M.D.   On: 11/22/2022 22:19    Medications:  sodium chloride 50 mL/hr at 11/21/22 0830    cinacalcet  30 mg Oral Q T,Th,Sa-HD   doxercalciferol  7 mcg Intravenous Q T,Th,Sa-HD   ferric citrate  420 mg Oral TID WC   And   ferric citrate  210 mg Oral With snacks   folic acid  1 mg Oral Daily   hydroxyurea  500 mg Oral Daily   lidocaine-prilocaine  1 Application Topical Q T,Th,Sa-HD   oxyCODONE  10 mg Oral Q12H   pantoprazole  40 mg Oral BID AC   sodium chloride flush  3 mL Intravenous Q12H   torsemide  40 mg Oral BID    Dialysis Orders: AF, T,Th,S 4 hr 15 min 180NRe 400/500 60.5 kg 2.0 K/ 2.0 Ca UFP 2 AVF - No Heparin - Sensipar 30 mg PO TIW - Hectorol 7 mcg IV TIW - Mircera 225 mcg IV 11/13/2022 Next dose due 11/27/2022  Assessment/Plan: Sickle Cell Crisis-management per primary Symptomatic Anemia- W/U per primary. + stools. Seen by GI, no endoscopic W/U planned. S/p CT ABD/pelvis: stable ascites, stable r-sided pleural effusion, and early cirrhosis. So far has received 3 unit PRBCs this admit. Last unit given 6/8 for Hgb 6.8. Will re-check Hgb this afternoon. Transfuse for Hgb < 7.  Persistent Afib-rate controlled. No AC D/T GIB  ESRD -  T,Th,S. On HD.   Hypertension/volume  - He is over OP EDW with mild JVD, denies SOB. CXR unremarkable. Attempt to lower to OP EDW. Continue Torsemide 40 mg PO BID.  Anemia  - As noted above. Metabolic bone disease -  Continue cinacalcet, Auryxia binders, hectorol.  Nutrition - Full liquids at present. Renal diet with fluid restrictions Albumin 3.8 11/01/2022 (OP clinic)  Chronic pain on opoid therapy- lactulose PRN constipation. Management per primary.  Salome Holmes, NP [facility] 11/24/2022,9:32 AM  LOS: 3 days

## 2022-11-24 NOTE — Progress Notes (Signed)
Subjective: Patrick [name]  is a 67 year old male with a medical history significant for sickle cell disease, persistent atrial fibrillation not on anticoagulation due to bleeding, systolic CHF, ESRD on HD (TTS), COPD, sickle cell disease, hypotension, chronic pain syndrome, thrombocytopenia, liver cirrhosis, history of GI bleed secondary to angiodysplasia who was admitted for worsening allover body pain especially bilateral knees. Patient underwent hemodialysis this a.m. without complication.  He also received 1 unit PRBCs. Patient is complaining of 9/10 pain.  He just received oxycodone prior to arrival, he has not had any relief.  He reports that he is unable to find a comfortable position.  Patient denies any dizziness, shortness of breath, headache, nausea, vomiting, or diarrhea today.  Patient is tolerating diet.    Objective:  Vital signs in last 24 hours:  Vitals:   11/24/22 1100 11/24/22 1117 11/24/22 1128 11/24/22 1206  BP: 103/65 106/68 (!) 110/59 114/65  Pulse: 72 71 74   Resp: 15 13 12    Temp:  97.8 F (36.6 C) 97.8 F (36.6 C)   TempSrc:  Oral Oral   SpO2: 99% 99% 97% 100%  Weight:   64.8 kg   Height:        Intake/Output from previous day:   Intake/Output Summary (Last 24 hours) at 11/24/2022 1503 Last data filed at 11/24/2022 1300 Gross per 24 hour  Intake 895 ml  Output 2035 ml  Net -1140 ml    Physical Exam Constitutional:      Appearance: He is ill-appearing.  Cardiovascular:     Rate and Rhythm: Tachycardia present. Rhythm irregular.  Pulmonary:     Effort: Pulmonary effort is normal.     Breath sounds: Normal breath sounds.  Abdominal:     General: There is abdominal bruit.     Palpations: There is no fluid wave.     Tenderness: There is no abdominal tenderness. There is no guarding.  Musculoskeletal:     Right knee: Swelling present. Decreased range of motion. Tenderness present.     Left knee: Swelling present. Decreased range of motion.  Tenderness present.  Skin:    General: Skin is warm.     Lab Results:  Basic Metabolic Panel:    Component Value Date/Time   NA 126 (L) 11/24/2022 0744   NA 138 08/01/2022 1348   K 4.2 11/24/2022 0744   CL 88 (L) 11/24/2022 0744   CO2 24 11/24/2022 0744   BUN 42 (H) 11/24/2022 0744   BUN 41 (H) 08/01/2022 1348   CREATININE 7.21 (H) 11/24/2022 0744   CREATININE 2.90 (H) 08/11/2015 1359   GLUCOSE 92 11/24/2022 0744   CALCIUM 8.3 (L) 11/24/2022 0744   CBC:    Component Value Date/Time   WBC 4.4 11/24/2022 0301   HGB 6.8 (LL) 11/24/2022 0301   HGB 7.9 (L) 10/30/2022 1228   HCT 19.0 (L) 11/24/2022 0301   HCT 22.1 (L) 10/30/2022 1228   PLT 109 (L) 11/24/2022 0301   PLT 92 (LL) 10/30/2022 1228   MCV 98.4 11/24/2022 0301   MCV 104 (H) 10/30/2022 1228   NEUTROABS 7.5 07/23/2022 1813   NEUTROABS 5.4 11/28/2021 1026   LYMPHSABS 0.2 (L) 07/23/2022 1813   LYMPHSABS 0.4 (L) 11/28/2021 1026   MONOABS 1.8 (H) 07/23/2022 1813   EOSABS 0.0 07/23/2022 1813   EOSABS 0.1 11/28/2021 1026   BASOSABS 0.0 07/23/2022 1813   BASOSABS 0.0 11/28/2021 1026    Recent Results (from the past 240 hour(s))  MRSA Next Gen by  PCR, Nasal     Status: None   Collection Time: 11/20/22  9:50 PM   Specimen: Nasal Mucosa; Nasal Swab  Result Value Ref Range Status   MRSA by PCR Next Gen NOT DETECTED NOT DETECTED Final    Comment: (NOTE) The GeneXpert MRSA Assay (FDA approved for NASAL specimens only), is one component of a comprehensive MRSA colonization surveillance program. It is not intended to diagnose MRSA infection nor to guide or monitor treatment for MRSA infections. Test performance is not FDA approved in patients less than 78 years old. Performed at [facility], [address] [place], [place] [address]     Studies/Results: CT ABDOMEN PELVIS WO CONTRAST  Result Date: 11/22/2022 CLINICAL DATA:  Acute abdominal pain on the right, initial encounter EXAM: CT ABDOMEN AND PELVIS  WITHOUT CONTRAST TECHNIQUE: Multidetector CT imaging of the abdomen and pelvis was performed following the standard protocol without IV contrast. RADIATION DOSE REDUCTION: This exam was performed according to the departmental dose-optimization program which includes automated exposure control, adjustment of the mA and/or kV according to patient size and/or use of iterative reconstruction technique. COMPARISON:  10/20/2022 FINDINGS: Lower chest: Small right-sided pleural effusion is noted similar to that seen on the prior exam. Heart remains enlarged in size. Hepatobiliary: Liver demonstrates mild nodularity. Status post cholecystectomy. No biliary dilatation. Pancreas: Unremarkable. No pancreatic ductal dilatation or surrounding inflammatory changes. Spleen: Spleen is calcified and atrophic but stable from the prior exam. This is consistent with auto infarction. Adrenals/Urinary Tract: Adrenal glands are within normal limits. Kidneys are well visualized bilaterally. Renal vascular calcifications are seen. No calculi are noted. Small cysts are seen. No follow-up is recommended. The bladder is decompressed. Stomach/Bowel: The appendix is within normal limits. Contrast is noted scattered throughout the colon without obstructive change. Stomach and small bowel are within normal limits. Vascular/Lymphatic: Aortic atherosclerosis. No enlarged abdominal or pelvic lymph nodes. Reproductive: Prostate is unremarkable. Other: Ascites is noted primarily within the lesser sac but also left upper quadrant similar to that seen on the prior exam. No focal hernia is seen. Musculoskeletal: Sclerotic changes of the bones are noted consistent with the known history of sickle cell disease. IMPRESSION: Stable appearing ascites. Small right-sided pleural effusion stable from the prior study. Early cirrhosis of the liver. Electronically Signed   By: Alcide Clever M.D.   On: 11/22/2022 22:19    Medications: Scheduled Meds:  cinacalcet   30 mg Oral Q T,Th,Sa-HD   doxercalciferol  7 mcg Intravenous Q T,Th,Sa-HD   ferric citrate  420 mg Oral TID WC   And   ferric citrate  210 mg Oral With snacks   folic acid  1 mg Oral Daily   hydroxyurea  500 mg Oral Daily   lidocaine-prilocaine  1 Application Topical Q T,Th,Sa-HD   oxyCODONE  10 mg Oral Q12H   pantoprazole  40 mg Oral BID AC   sodium chloride flush  3 mL Intravenous Q12H   torsemide  40 mg Oral BID   Continuous Infusions:   PRN Meds:.acetaminophen **OR** acetaminophen, albuterol, HYDROmorphone (DILAUDID) injection, lactulose, naLOXone (NARCAN)  injection, ondansetron, oxyCODONE  Consultants: Renal  Procedures: none  Antibiotics: none  Assessment/Plan: Principal Problem:   Sickle cell pain crisis (HCC) Active Problems:   Sickle cell crisis (HCC)   Sickle cell disease (HCC)   ESRD on hemodialysis (HCC)   Permanent atrial fibrillation (HCC)   Chronic systolic CHF (congestive heart failure) (HCC)   COPD (chronic obstructive pulmonary disease) (HCC)  Symptomatic anemia   GI bleed   Symptomatic anemia possibly secondary to GI bleed: Hemoglobin decreased this a.m. at 6.8 g/dL.  Patient received an additional unit of PRBCs and HD.  Will monitor labs in AM.     Sickle cell disease with pain crisis: Patient reporting allover body pain that has been worsening overnight.  Continue oxycodone 10 mg to every 4 hours as needed. Increase IV Dilaudid to 1 mg every 2 hours as needed for severe breakthrough pain. OxyContin 10 mg every 12 hours.  Continue folic acid.  Hydroxyurea restarted today.  ESRD on HD: Patient on TTS schedule.  Last received dialysis on yesterday.  Nephrology will assess in AM.  Systolic heart failure: Chronic.  Most recent echo shows EF 35 to 40%.  Monitor fluid intake closely.  History of gout, Continue allopurinol  History of COPD.  Patient is a former smoker.  No wheezing at this time.  Albuterol nebulizers as needed for shortness of  breath     Code Status: Full Code Family Communication: N/A Disposition Plan: Not yet ready for discharge  Nolon Nations  APRN, MSN, FNP-C [facility] [name] [place], [place] [address] [phone]  If 7PM-7AM, please contact night-coverage.  11/24/2022, 3:03 PM  LOS: 3 days

## 2022-11-24 NOTE — Progress Notes (Signed)
   11/24/22 0407  Provider Notification  Provider Name/Title Dr. Dow Adolph  Date Provider Notified 11/24/22  Time Provider Notified 0407  Method of Notification Page  Notification Reason Critical Result  Test performed and critical result Hgb 6.8  Date Critical Result Received 11/24/22  Provider response See new orders  Date of Provider Response 11/24/22  Time of Provider Response 0416   Critical hgb 6.8.  Dr. Margo Aye called and made aware.  Order to infuse one unit of PRBC during hemodialysis today received.  Will continue to monitor patient.  Bernie Covey RN

## 2022-11-25 DIAGNOSIS — D5700 Hb-SS disease with crisis, unspecified: Secondary | ICD-10-CM | POA: Diagnosis not present

## 2022-11-25 LAB — TYPE AND SCREEN

## 2022-11-25 MED ORDER — ALLOPURINOL 100 MG PO TABS
100.0000 mg | ORAL_TABLET | Freq: Every day | ORAL | Status: DC
  Administered 2022-11-25 – 2022-11-27 (×3): 100 mg via ORAL
  Filled 2022-11-25 (×3): qty 1

## 2022-11-25 NOTE — Progress Notes (Signed)
Subjective: Patrick [name]  is a 67 year old male with a medical history significant for sickle cell disease, persistent atrial fibrillation not on anticoagulation due to bleeding, systolic CHF, ESRD on HD (TTS), COPD, sickle cell disease, hypotension, chronic pain syndrome, thrombocytopenia, liver cirrhosis, history of GI bleed secondary to angiodysplasia who was admitted for worsening allover body pain especially bilateral knees. Patient states the pain intensity improved some overnight.  He rates pain as 7/10.  Pain is primarily to bilateral knees. Patient denies any dizziness, shortness of breath, headache, nausea, vomiting, or diarrhea today.  Patient is tolerating diet.    Objective:  Vital signs in last 24 hours:  Vitals:   11/24/22 1631 11/24/22 2015 11/25/22 0314 11/25/22 0908  BP: (!) 116/52 99/68 101/73 106/63  Pulse: 71 79 78   Resp: 19 18 18 18   Temp: 98 F (36.7 C) 98.1 F (36.7 C) 97.7 F (36.5 C) 98.3 F (36.8 C)  TempSrc: Oral Oral Oral   SpO2: 100% 100% 100% 100%  Weight:      Height:        Intake/Output from previous day:   Intake/Output Summary (Last 24 hours) at 11/25/2022 1142 Last data filed at 11/25/2022 0600 Gross per 24 hour  Intake 720 ml  Output 0 ml  Net 720 ml    Physical Exam Constitutional:      Appearance: He is ill-appearing.  Cardiovascular:     Rate and Rhythm: Tachycardia present. Rhythm irregular.  Pulmonary:     Effort: Pulmonary effort is normal.     Breath sounds: Normal breath sounds.  Abdominal:     General: There is abdominal bruit.     Palpations: There is no fluid wave.     Tenderness: There is no abdominal tenderness. There is no guarding.  Musculoskeletal:     Right knee: Swelling present. Decreased range of motion. Tenderness present.     Left knee: Swelling present. Decreased range of motion. Tenderness present.  Skin:    General: Skin is warm.     Lab Results:  Basic Metabolic Panel:    Component Value  Date/Time   NA 126 (L) 11/24/2022 0744   NA 138 08/01/2022 1348   K 4.2 11/24/2022 0744   CL 88 (L) 11/24/2022 0744   CO2 24 11/24/2022 0744   BUN 42 (H) 11/24/2022 0744   BUN 41 (H) 08/01/2022 1348   CREATININE 7.21 (H) 11/24/2022 0744   CREATININE 2.90 (H) 08/11/2015 1359   GLUCOSE 92 11/24/2022 0744   CALCIUM 8.3 (L) 11/24/2022 0744   CBC:    Component Value Date/Time   WBC 4.4 11/24/2022 0301   HGB 8.7 (L) 11/24/2022 1521   HGB 7.9 (L) 10/30/2022 1228   HCT 24.8 (L) 11/24/2022 1521   HCT 22.1 (L) 10/30/2022 1228   PLT 109 (L) 11/24/2022 0301   PLT 92 (LL) 10/30/2022 1228   MCV 98.4 11/24/2022 0301   MCV 104 (H) 10/30/2022 1228   NEUTROABS 7.5 07/23/2022 1813   NEUTROABS 5.4 11/28/2021 1026   LYMPHSABS 0.2 (L) 07/23/2022 1813   LYMPHSABS 0.4 (L) 11/28/2021 1026   MONOABS 1.8 (H) 07/23/2022 1813   EOSABS 0.0 07/23/2022 1813   EOSABS 0.1 11/28/2021 1026   BASOSABS 0.0 07/23/2022 1813   BASOSABS 0.0 11/28/2021 1026    Recent Results (from the past 240 hour(s))  MRSA Next Gen by PCR, Nasal     Status: None   Collection Time: 11/20/22  9:50 PM   Specimen: Nasal Mucosa;  Nasal Swab  Result Value Ref Range Status   MRSA by PCR Next Gen NOT DETECTED NOT DETECTED Final    Comment: (NOTE) The GeneXpert MRSA Assay (FDA approved for NASAL specimens only), is one component of a comprehensive MRSA colonization surveillance program. It is not intended to diagnose MRSA infection nor to guide or monitor treatment for MRSA infections. Test performance is not FDA approved in patients less than 63 years old. Performed at [facility], [address], [place], [place] [address]     Studies/Results: No results found.  Medications: Scheduled Meds:  allopurinol  100 mg Oral Daily   cinacalcet  30 mg Oral Q T,Th,Sa-HD   doxercalciferol  7 mcg Intravenous Q T,Th,Sa-HD   ferric citrate  420 mg Oral TID WC   And   ferric citrate  210 mg Oral With snacks   folic acid  1 mg  Oral Daily   hydroxyurea  500 mg Oral Daily   lidocaine-prilocaine  1 Application Topical Q T,Th,Sa-HD   oxyCODONE  10 mg Oral Q12H   pantoprazole  40 mg Oral BID AC   sodium chloride flush  3 mL Intravenous Q12H   torsemide  40 mg Oral BID   Continuous Infusions:   PRN Meds:.acetaminophen **OR** acetaminophen, albuterol, HYDROmorphone (DILAUDID) injection, lactulose, naLOXone (NARCAN)  injection, ondansetron, oxyCODONE  Consultants: Renal  Procedures: none  Antibiotics: none  Assessment/Plan: Principal Problem:   Sickle cell pain crisis (HCC) Active Problems:   Sickle cell crisis (HCC)   Sickle cell disease (HCC)   ESRD on hemodialysis (HCC)   Permanent atrial fibrillation (HCC)   Chronic systolic CHF (congestive heart failure) (HCC)   COPD (chronic obstructive pulmonary disease) (HCC)   Symptomatic anemia   GI bleed   Symptomatic anemia possibly secondary to GI bleed: Patient has received a total of 3 units PRBCs during this admission.  Last unit received on 11/24/2022.  Most recent hemoglobin is 8.7 g/dL.  Will follow labs in AM.  Patient encouraged to ambulate in preparation for discharge on 11/26/2022.   Sickle cell disease with pain crisis: Pain intensity has improved today.  Continue oxycodone 10 mg to every 4 hours as needed. IV Dilaudid to 1 mg every 2 hours as needed for severe breakthrough pain. OxyContin 10 mg every 12 hours.  Continue folic acid.  Hydroxyurea restarted today.  ESRD on HD: Patient on TTS schedule.  Last received dialysis on yesterday.  Nephrology will assess in AM.  Systolic heart failure: Chronic.  Most recent echo shows EF 35 to 40%.  Monitor fluid intake closely.  History of gout, Continue allopurinol  History of COPD.  Patient is a former smoker.  No wheezing at this time.  Albuterol nebulizers as needed for shortness of breath     Code Status: Full Code Family Communication: N/A Disposition Plan: Not yet ready for discharge.   Discharge planned for 11/26/2022.  Nolon Nations  APRN, MSN, FNP-C [facility] [address] [place], [place] [address] [phone]  If 7PM-7AM, please contact night-coverage.  11/25/2022, 11:42 AM  LOS: 4 days

## 2022-11-25 NOTE — Progress Notes (Signed)
[facility] Progress Note   Subjective:    Seen and examined patient at bedside. He denies any acute complaints. He tolerated yesterday's HD with net UF 2L. Hgb re-check from yesterday was 8.7. Next HD 6/11.  Objective Vitals:   11/24/22 1631 11/24/22 2015 11/25/22 0314 11/25/22 0908  BP: (!) 116/52 99/68 101/73 106/63  Pulse: 71 79 78   Resp: 19 18 18 18   Temp: 98 F (36.7 C) 98.1 F (36.7 C) 97.7 F (36.5 C) 98.3 F (36.8 C)  TempSrc: Oral Oral Oral   SpO2: 100% 100% 100% 100%  Weight:      Height:       Physical Exam General: Awake, alert, on RA, NAD Heart: Irregular, No murmurs, gallops, or rubs Lungs: Clear anteriorly and laterally Abdomen: Soft and non-tender Extremities:No LE edema Dialysis Access: R AVF; aneurysmal;  (+) B/T  Filed Weights   11/24/22 0735 11/24/22 0744 11/24/22 1128  Weight: 67 kg 67 kg 64.8 kg    Intake/Output Summary (Last 24 hours) at 11/25/2022 1015 Last data filed at 11/25/2022 0600 Gross per 24 hour  Intake 720 ml  Output 2035 ml  Net -1315 ml    Additional Objective Labs: Basic Metabolic Panel: Recent Labs  Lab 11/21/22 0430 11/22/22 0630 11/24/22 0744  NA 134* 128* 126*  K 4.3 4.6 4.2  CL 92* 88* 88*  CO2 26 24 24   GLUCOSE 88 95 92  BUN 51* 61* 42*  CREATININE 6.68* 7.80* 7.21*  CALCIUM 7.8* 8.1* 8.3*  PHOS  --  6.1* 4.2   Liver Function Tests: Recent Labs  Lab 11/20/22 1330 11/21/22 1800 11/22/22 0630 11/24/22 0744  AST 61* 62*  --   --   ALT 40 45*  --   --   ALKPHOS 230* 194*  --   --   BILITOT 2.2* 1.9*  --   --   PROT 7.9 7.8  --   --   ALBUMIN 3.6 3.4* 3.0* 2.9*   Recent Labs  Lab 11/20/22 1408  LIPASE 33   CBC: Recent Labs  Lab 11/21/22 0430 11/21/22 1800 11/22/22 0630 11/22/22 1618 11/23/22 1241 11/24/22 0301 11/24/22 1521  WBC 5.3 5.3 4.7  --  4.3 4.4  --   HGB 6.2* 7.1* 6.4*   < > 8.0* 6.8* 8.7*  HCT 16.6* 19.2* 17.4*   < > 22.4* 19.0* 24.8*  MCV 101.2* 103.2* 100.0  --   100.9* 98.4  --   PLT 107* 78* 113*  --  115* 109*  --    < > = values in this interval not displayed.   Blood Culture    Component Value Date/Time   SDES EXPECTORATED SPUTUM 07/25/2022 0800   SPECREQUEST NONE 07/25/2022 0800   CULT  07/24/2022 0053    NO GROWTH 5 DAYS Performed at [facility], [address] [place], [place] [address]    REPTSTATUS 07/25/2022 FINAL 07/25/2022 0800    Cardiac Enzymes: No results for input(s): "CKTOTAL", "CKMB", "CKMBINDEX", "TROPONINI" in the last 168 hours. CBG: Recent Labs  Lab 11/22/22 0535 11/22/22 1624 11/24/22 1202  GLUCAP 85 75 79   Iron Studies: No results for input(s): "IRON", "TIBC", "TRANSFERRIN", "FERRITIN" in the last 72 hours. Lab Results  Component Value Date   INR 1.5 (H) 10/21/2022   INR 1.3 (H) 06/04/2022   INR 1.3 (H) 05/17/2022   Studies/Results: No results found.  Medications:   allopurinol  100 mg Oral Daily   cinacalcet  30 mg Oral Q T,Th,Sa-HD   doxercalciferol  7 mcg Intravenous Q T,Th,Sa-HD   ferric citrate  420 mg Oral TID WC   And   ferric citrate  210 mg Oral With snacks   folic acid  1 mg Oral Daily   hydroxyurea  500 mg Oral Daily   lidocaine-prilocaine  1 Application Topical Q T,Th,Sa-HD   oxyCODONE  10 mg Oral Q12H   pantoprazole  40 mg Oral BID AC   sodium chloride flush  3 mL Intravenous Q12H   torsemide  40 mg Oral BID    Dialysis Orders: AF, T,Th,S 4 hr 15 min 180NRe 400/500 60.5 kg 2.0 K/ 2.0 Ca UFP 2 AVF - No Heparin - Sensipar 30 mg PO TIW - Hectorol 7 mcg IV TIW - Mircera 225 mcg IV 11/13/2022 Next dose due 11/27/2022  Assessment/Plan: Sickle Cell Crisis-management per primary Symptomatic Anemia- W/U per primary. + stools. Seen by GI, no endoscopic W/U planned. S/p CT ABD/pelvis: stable ascites, stable r-sided pleural effusion, and early cirrhosis. So far has received 3 unit PRBCs this admit. Last unit given 6/8 for Hgb 6.8. Hgb re-check 8.7. Transfuse for Hgb < 7.   Persistent Afib-rate controlled. No AC D/T GIB  ESRD -  T,Th,S. Next HD 11/27/22.  Hypertension/volume  - He is over OP EDW with mild JVD, denies SOB. CXR unremarkable. Getting closer to EDW. Continue to push UF here to lower to OP EDW. Continue Torsemide 40 mg PO BID.  Anemia  - As noted above. Metabolic bone disease -  Continue cinacalcet, Auryxia binders, hectorol.  Nutrition - Full liquids at present. Renal diet with fluid restrictions Albumin 3.8 11/01/2022 (OP clinic)  Chronic pain on opoid therapy- lactulose PRN constipation. Management per primary.  Salome Holmes, NP [facility] 11/25/2022,10:15 AM  LOS: 4 days

## 2022-11-26 DIAGNOSIS — D5700 Hb-SS disease with crisis, unspecified: Secondary | ICD-10-CM | POA: Diagnosis not present

## 2022-11-26 LAB — CBC
HCT: 25.1 % — ABNORMAL LOW (ref 39.0–52.0)
Hemoglobin: 8.7 g/dL — ABNORMAL LOW (ref 13.0–17.0)
MCH: 34.7 pg — ABNORMAL HIGH (ref 26.0–34.0)
MCHC: 34.7 g/dL (ref 30.0–36.0)
MCV: 100 fL (ref 80.0–100.0)
Platelets: 127 10*3/uL — ABNORMAL LOW (ref 150–400)
RBC: 2.51 MIL/uL — ABNORMAL LOW (ref 4.22–5.81)
RDW: 21 % — ABNORMAL HIGH (ref 11.5–15.5)
WBC: 5.1 10*3/uL (ref 4.0–10.5)
nRBC: 3.2 % — ABNORMAL HIGH (ref 0.0–0.2)

## 2022-11-26 LAB — COMPREHENSIVE METABOLIC PANEL
ALT: 20 U/L (ref 0–44)
AST: 20 U/L (ref 15–41)
Albumin: 3 g/dL — ABNORMAL LOW (ref 3.5–5.0)
Alkaline Phosphatase: 187 U/L — ABNORMAL HIGH (ref 38–126)
Anion gap: 16 — ABNORMAL HIGH (ref 5–15)
BUN: 47 mg/dL — ABNORMAL HIGH (ref 8–23)
CO2: 23 mmol/L (ref 22–32)
Calcium: 8.3 mg/dL — ABNORMAL LOW (ref 8.9–10.3)
Chloride: 88 mmol/L — ABNORMAL LOW (ref 98–111)
Creatinine, Ser: 7.14 mg/dL — ABNORMAL HIGH (ref 0.61–1.24)
GFR, Estimated: 8 mL/min — ABNORMAL LOW (ref >60)
Glucose, Bld: 102 mg/dL — ABNORMAL HIGH (ref 70–99)
Potassium: 4.4 mmol/L (ref 3.5–5.1)
Sodium: 127 mmol/L — ABNORMAL LOW (ref 135–145)
Total Bilirubin: 1.1 mg/dL (ref 0.3–1.2)
Total Protein: 6.9 g/dL (ref 6.5–8.1)

## 2022-11-26 LAB — RETICULOCYTES
Immature Retic Fract: 33 % — ABNORMAL HIGH (ref 2.3–15.9)
RBC.: 2.49 MIL/uL — ABNORMAL LOW (ref 4.22–5.81)
Retic Count, Absolute: 215 10*3/uL — ABNORMAL HIGH (ref 19.0–186.0)
Retic Ct Pct: 6.5 % — ABNORMAL HIGH (ref 0.4–3.1)

## 2022-11-26 LAB — FERRITIN: Ferritin: 1159 ng/mL — ABNORMAL HIGH (ref 24–336)

## 2022-11-26 MED ORDER — CHLORHEXIDINE GLUCONATE CLOTH 2 % EX PADS: 6.0000 | MEDICATED_PAD | Freq: Every day | CUTANEOUS | Status: DC

## 2022-11-26 NOTE — Progress Notes (Signed)
Subjective: Patrick [name]  is a 67 year old male with a medical history significant for sickle cell disease, persistent atrial fibrillation not on anticoagulation due to bleeding, systolic CHF, ESRD on HD (TTS), COPD, sickle cell disease, hypotension, chronic pain syndrome, thrombocytopenia, liver cirrhosis, history of GI bleed secondary to angiodysplasia who was admitted for worsening allover body pain especially bilateral knees. Patient states the pain intensity improved some overnight.  He rates pain as 7/10.  Pain is primarily to bilateral knees.  Patient is complaining of pain to right flank.  Patient states that his abdomen feels "tight".  He denies any nausea, vomiting, diarrhea, constipation, or flatus.  His last bowel movement was this a.m.  Patient is tolerating diet. Patient denies any dizziness, shortness of breath, headache, nausea, vomiting, or diarrhea today.      Objective:  Vital signs in last 24 hours:  Vitals:   11/25/22 1644 11/25/22 2141 11/26/22 0448 11/26/22 0901  BP: (!) 92/58 95/63 95/65  (!) 103/59  Pulse: 80 86 72 84  Resp: 18 18 18 18   Temp: 98.5 F (36.9 C) 98 F (36.7 C) 97.6 F (36.4 C)   TempSrc:  Oral Oral   SpO2: 100% 100% 100% 100%  Weight:      Height:        Intake/Output from previous day:   Intake/Output Summary (Last 24 hours) at 11/26/2022 1400 Last data filed at 11/26/2022 0909 Gross per 24 hour  Intake 603 ml  Output 0 ml  Net 603 ml    Physical Exam Constitutional:      Appearance: He is ill-appearing.  Cardiovascular:     Rate and Rhythm: Tachycardia present. Rhythm irregular.  Pulmonary:     Effort: Pulmonary effort is normal.     Breath sounds: Normal breath sounds.  Abdominal:     General: There is abdominal bruit.     Palpations: There is no fluid wave.     Tenderness: There is no abdominal tenderness. There is guarding.  Musculoskeletal:     Right knee: Swelling present. Decreased range of motion. Tenderness present.      Left knee: Swelling present. Decreased range of motion. Tenderness present.  Skin:    General: Skin is warm.     Lab Results:  Basic Metabolic Panel:    Component Value Date/Time   NA 127 (L) 11/26/2022 0350   NA 138 08/01/2022 1348   K 4.4 11/26/2022 0350   CL 88 (L) 11/26/2022 0350   CO2 23 11/26/2022 0350   BUN 47 (H) 11/26/2022 0350   BUN 41 (H) 08/01/2022 1348   CREATININE 7.14 (H) 11/26/2022 0350   CREATININE 2.90 (H) 08/11/2015 1359   GLUCOSE 102 (H) 11/26/2022 0350   CALCIUM 8.3 (L) 11/26/2022 0350   CBC:    Component Value Date/Time   WBC 5.1 11/26/2022 0350   HGB 8.7 (L) 11/26/2022 0350   HGB 7.9 (L) 10/30/2022 1228   HCT 25.1 (L) 11/26/2022 0350   HCT 22.1 (L) 10/30/2022 1228   PLT 127 (L) 11/26/2022 0350   PLT 92 (LL) 10/30/2022 1228   MCV 100.0 11/26/2022 0350   MCV 104 (H) 10/30/2022 1228   NEUTROABS 7.5 07/23/2022 1813   NEUTROABS 5.4 11/28/2021 1026   LYMPHSABS 0.2 (L) 07/23/2022 1813   LYMPHSABS 0.4 (L) 11/28/2021 1026   MONOABS 1.8 (H) 07/23/2022 1813   EOSABS 0.0 07/23/2022 1813   EOSABS 0.1 11/28/2021 1026   BASOSABS 0.0 07/23/2022 1813   BASOSABS 0.0 11/28/2021 1026  Recent Results (from the past 240 hour(s))  MRSA Next Gen by PCR, Nasal     Status: None   Collection Time: 11/20/22  9:50 PM   Specimen: Nasal Mucosa; Nasal Swab  Result Value Ref Range Status   MRSA by PCR Next Gen NOT DETECTED NOT DETECTED Final    Comment: (NOTE) The GeneXpert MRSA Assay (FDA approved for NASAL specimens only), is one component of a comprehensive MRSA colonization surveillance program. It is not intended to diagnose MRSA infection nor to guide or monitor treatment for MRSA infections. Test performance is not FDA approved in patients less than 31 years old. Performed at [facility], [address] [place], [place] [address]     Studies/Results: No results found.  Medications: Scheduled Meds:  allopurinol  100 mg Oral Daily    [START ON 11/27/2022] Chlorhexidine Gluconate Cloth  6 each Topical Q0600   cinacalcet  30 mg Oral Q T,Th,Sa-HD   doxercalciferol  7 mcg Intravenous Q T,Th,Sa-HD   ferric citrate  420 mg Oral TID WC   And   ferric citrate  210 mg Oral With snacks   folic acid  1 mg Oral Daily   hydroxyurea  500 mg Oral Daily   lidocaine-prilocaine  1 Application Topical Q T,Th,Sa-HD   oxyCODONE  10 mg Oral Q12H   pantoprazole  40 mg Oral BID AC   sodium chloride flush  3 mL Intravenous Q12H   torsemide  40 mg Oral BID   Continuous Infusions:   PRN Meds:.acetaminophen **OR** acetaminophen, albuterol, HYDROmorphone (DILAUDID) injection, lactulose, naLOXone (NARCAN)  injection, ondansetron, oxyCODONE  Consultants: Renal  Procedures: none  Antibiotics: none  Assessment/Plan: Principal Problem:   Sickle cell pain crisis (HCC) Active Problems:   Sickle cell crisis (HCC)   Sickle cell disease (HCC)   ESRD on hemodialysis (HCC)   Permanent atrial fibrillation (HCC)   Chronic systolic CHF (congestive heart failure) (HCC)   COPD (chronic obstructive pulmonary disease) (HCC)   Symptomatic anemia   GI bleed   Symptomatic anemia possibly secondary to GI bleed: Patient has received a total of 3 units PRBCs during this admission.  Last unit received on 11/24/2022.  Most recent hemoglobin is 8.7 g/dL.  Will follow labs in AM.    Sickle cell disease with pain crisis: Pain intensity has improved today.  Continue oxycodone 10 mg to every 4 hours as needed. IV Dilaudid to 1 mg every 2 hours as needed for severe breakthrough pain. OxyContin 10 mg every 12 hours.  Continue folic acid.  Hydroxyurea restarted today.  ESRD on HD: Patient on TTS schedule.  Last received dialysis on yesterday.  Nephrology will assess in AM.  Systolic heart failure: Chronic.  Most recent echo shows EF 35 to 40%.  Monitor fluid intake closely.  History of gout, Continue allopurinol  History of COPD.  Patient is a former  smoker.  No wheezing at this time.  Albuterol nebulizers as needed for shortness of breath     Code Status: Full Code Family Communication: N/A Disposition Plan: Not yet ready for discharge.  Nolon Nations  APRN, MSN, FNP-C Patient Care [facility] [address] [place], [place] [address] [phone]  If 7PM-7AM, please contact night-coverage.  11/26/2022, 2:00 PM  LOS: 5 days

## 2022-11-26 NOTE — Consult Note (Signed)
   [facility] CM Inpatient Consult   11/26/2022  BECKAM ABDULAZIZ Nov 15, 1955 [id number]  [facility] [[facility]]  Accountable Care Organization [ACO] Patient: [employer]  Primary Care Provider:  Quentin Angst, MD   Patient screened for less than 30 days readmission hospitalization with noted extreme high risk score for unplanned readmission risk with a 6 day length of stay and  to assess for potential [facility]  [[facility]] Care Management service needs for post hospital transition for care coordination.  Review of patient's electronic medical record in [facility] reveals patient is active with [facility] for care coordination.  Patient is active with SS team as well. Patient has HD noted schedule of TTS.   Plan:  Patient listed with an external with [facility] for Care Management team and no noted needs for [facility] for additional needs, as patient is to have follow up with [facility] for post hospital care coordination.  Of note, [facility] Care Management/Population Health does not replace or interfere with any arrangements made by the Inpatient Transition of Care team.  For questions contact:   Charlesetta Shanks, RN BSN CCM [facility]  [phone] business mobile phone Toll free office [phone]  *Concierge Line  [phone] Fax number: [phone] [email or web address]

## 2022-11-26 NOTE — Progress Notes (Signed)
Inquired of provider if pt for d/c today. Advised pt not stable for d/c today due to abdominal pain. Will provide update to out-pt HD clinic re: d/c date once known. Will assist as needed.   Olivia Canter Renal Navigator [phone]

## 2022-11-26 NOTE — Care Management Important Message (Signed)
Important Message  Patient Details  Name: Patrick [name] MRN: [id number] Date of Birth: 1955/09/19   Medicare Important Message Given:  Yes     Sherilyn Banker 11/26/2022, 3:44 PM

## 2022-11-26 NOTE — Progress Notes (Signed)
[facility] Progress Note   Subjective:  Patient seen and examined at bedside.  Reports 7/10 pain mostly in his lower abdomen.  States the pain started last night.  Also reports abdominal fullness.  Denies n/v/d, fever, chills, CP, and shortness of breath.  Had a small BM this AM with little relief.  No other specific complaints.   Objective Vitals:   11/25/22 1644 11/25/22 2141 11/26/22 0448 11/26/22 0901  BP: (!) 92/58 95/63 95/65  (!) 103/59  Pulse: 80 86 72 84  Resp: 18 18 18 18   Temp: 98.5 F (36.9 C) 98 F (36.7 C) 97.6 F (36.4 C)   TempSrc:  Oral Oral   SpO2: 100% 100% 100% 100%  Weight:      Height:       Physical Exam General:chronically ill appearing male in NAD Heart:IR, no mrg Lungs:CTAB, nml WOB Abdomen:+distended, NT, +BS Extremities:no LE edema Dialysis Access: R AVF +b/t   Filed Weights   11/24/22 0735 11/24/22 0744 11/24/22 1128  Weight: 67 kg 67 kg 64.8 kg    Intake/Output Summary (Last 24 hours) at 11/26/2022 1253 Last data filed at 11/26/2022 0909 Gross per 24 hour  Intake 843 ml  Output 2 ml  Net 841 ml    Additional Objective Labs: Basic Metabolic Panel: Recent Labs  Lab 11/22/22 0630 11/24/22 0744 11/26/22 0350  NA 128* 126* 127*  K 4.6 4.2 4.4  CL 88* 88* 88*  CO2 24 24 23   GLUCOSE 95 92 102*  BUN 61* 42* 47*  CREATININE 7.80* 7.21* 7.14*  CALCIUM 8.1* 8.3* 8.3*  PHOS 6.1* 4.2  --    Liver Function Tests: Recent Labs  Lab 11/20/22 1330 11/21/22 1800 11/22/22 0630 11/24/22 0744 11/26/22 0350  AST 61* 62*  --   --  20  ALT 40 45*  --   --  20  ALKPHOS 230* 194*  --   --  187*  BILITOT 2.2* 1.9*  --   --  1.1  PROT 7.9 7.8  --   --  6.9  ALBUMIN 3.6 3.4* 3.0* 2.9* 3.0*   Recent Labs  Lab 11/20/22 1408  LIPASE 33   CBC: Recent Labs  Lab 11/21/22 1800 11/22/22 0630 11/22/22 1618 11/23/22 1241 11/24/22 0301 11/24/22 1521 11/26/22 0350  WBC 5.3 4.7  --  4.3 4.4  --  5.1  HGB 7.1* 6.4*   < > 8.0*  6.8* 8.7* 8.7*  HCT 19.2* 17.4*   < > 22.4* 19.0* 24.8* 25.1*  MCV 103.2* 100.0  --  100.9* 98.4  --  100.0  PLT 78* 113*  --  115* 109*  --  127*   < > = values in this interval not displayed.   CBG: Recent Labs  Lab 11/22/22 0535 11/22/22 1624 11/24/22 1202  GLUCAP 85 75 79   Iron Studies:  Recent Labs    11/26/22 0350  FERRITIN 1,159*   Lab Results  Component Value Date   INR 1.5 (H) 10/21/2022   INR 1.3 (H) 06/04/2022   INR 1.3 (H) 05/17/2022   Studies/Results: No results found.  Medications:   allopurinol  100 mg Oral Daily   cinacalcet  30 mg Oral Q T,Th,Sa-HD   doxercalciferol  7 mcg Intravenous Q T,Th,Sa-HD   ferric citrate  420 mg Oral TID WC   And   ferric citrate  210 mg Oral With snacks   folic acid  1 mg Oral Daily   hydroxyurea  500 mg Oral  Daily   lidocaine-prilocaine  1 Application Topical Q T,Th,Sa-HD   oxyCODONE  10 mg Oral Q12H   pantoprazole  40 mg Oral BID AC   sodium chloride flush  3 mL Intravenous Q12H   torsemide  40 mg Oral BID    Dialysis Orders: AF, T,Th,S 4 hr 15 min 180NRe 400/500 60.5 kg 2.0 K/ 2.0 Ca UFP 2 AVF - No Heparin - Sensipar 30 mg PO TIW - Hectorol 7 mcg IV TIW - Mircera 225 mcg IV 11/13/2022 Next dose due 11/27/2022   Assessment/Plan: Sickle Cell Crisis-management per primary Symptomatic Anemia- W/U per primary. Heme + stools. Seen by GI, no endoscopic W/U planned. S/p CT ABD/pelvis: stable ascites, stable r-sided pleural effusion, and early cirrhosis. So far has received 3 unit PRBCs this admit. Last unit given 6/8 for Hgb 6.8. Hgb stable at 8.7. Transfuse for Hgb < 7.  Persistent Afib-rate controlled. No AC D/T GIB  ESRD -  T,Th,S. Next HD 11/27/22.  Hypotension/volume  - Hypotension.  Not on midodrine.  Can use albumin w/HD for BP support. Not to dry, continue UF as tolerated. Continue Torsemide 40 mg PO BID.  Anemia  - As noted above. Metabolic bone disease -  Calcium and phos in goal. Continue cinacalcet,  Auryxia binders, hectorol.  Nutrition - Full liquids at present. Renal diet with fluid restrictions Albumin 3.8 11/01/2022 (OP clinic)  Chronic pain on opoid therapy- lactulose PRN constipation. Management per primary  Virgina Norfolk, PA-C [facility] 11/26/2022,12:53 PM  LOS: 5 days

## 2022-11-27 ENCOUNTER — Encounter: Payer: Self-pay | Admitting: Family Medicine

## 2022-11-27 ENCOUNTER — Ambulatory Visit: Payer: Self-pay | Admitting: Family Medicine

## 2022-11-27 DIAGNOSIS — D5700 Hb-SS disease with crisis, unspecified: Secondary | ICD-10-CM | POA: Diagnosis not present

## 2022-11-27 LAB — RENAL FUNCTION PANEL
Albumin: 2.9 g/dL — ABNORMAL LOW (ref 3.5–5.0)
Anion gap: 12 (ref 5–15)
BUN: 68 mg/dL — ABNORMAL HIGH (ref 8–23)
CO2: 24 mmol/L (ref 22–32)
Calcium: 8.6 mg/dL — ABNORMAL LOW (ref 8.9–10.3)
Chloride: 90 mmol/L — ABNORMAL LOW (ref 98–111)
Creatinine, Ser: 8.61 mg/dL — ABNORMAL HIGH (ref 0.61–1.24)
GFR, Estimated: 6 mL/min — ABNORMAL LOW (ref >60)
Glucose, Bld: 84 mg/dL (ref 70–99)
Phosphorus: 4.2 mg/dL (ref 2.5–4.6)
Potassium: 4.5 mmol/L (ref 3.5–5.1)
Sodium: 126 mmol/L — ABNORMAL LOW (ref 135–145)

## 2022-11-27 LAB — CBC
HCT: 23.5 % — ABNORMAL LOW (ref 39.0–52.0)
Hemoglobin: 8.3 g/dL — ABNORMAL LOW (ref 13.0–17.0)
MCH: 35.5 pg — ABNORMAL HIGH (ref 26.0–34.0)
MCHC: 35.3 g/dL (ref 30.0–36.0)
MCV: 100.4 fL — ABNORMAL HIGH (ref 80.0–100.0)
Platelets: 122 10*3/uL — ABNORMAL LOW (ref 150–400)
RBC: 2.34 MIL/uL — ABNORMAL LOW (ref 4.22–5.81)
RDW: 20.6 % — ABNORMAL HIGH (ref 11.5–15.5)
WBC: 4.6 10*3/uL (ref 4.0–10.5)
nRBC: 1.8 % — ABNORMAL HIGH (ref 0.0–0.2)

## 2022-11-27 MED ORDER — PENTAFLUOROPROP-TETRAFLUOROETH EX AERO: 1.0000 | INHALATION_SPRAY | CUTANEOUS | Status: DC | PRN

## 2022-11-27 MED ORDER — LIDOCAINE-PRILOCAINE 2.5-2.5 % EX CREA: 1.0000 | TOPICAL_CREAM | CUTANEOUS | Status: DC | PRN

## 2022-11-27 MED ORDER — LIDOCAINE HCL (PF) 1 % IJ SOLN: 5.0000 mL | INTRAMUSCULAR | Status: DC | PRN

## 2022-11-27 MED ORDER — PANTOPRAZOLE SODIUM 40 MG PO TBEC: 40.0000 mg | DELAYED_RELEASE_TABLET | Freq: Every day | ORAL | Status: DC

## 2022-11-27 MED ORDER — PANTOPRAZOLE SODIUM 40 MG PO TBEC: 40.0000 mg | DELAYED_RELEASE_TABLET | Freq: Two times a day (BID) | ORAL | Status: DC

## 2022-11-27 NOTE — Plan of Care (Signed)
[facility] Patient Discharge Orders- [facility] CLINIC: [facility]  Patient's name: Patrick [name] Admit/DC Dates: 11/20/2022 - 11/27/22  Discharge Diagnoses: Sickle cell pain crisis    Symptomatic anemia  Aranesp: Given: not given    Last Hgb: 8.3 PRBC's Given: 3units  Date/# of units: 1 on 6/4, 6/6 and 6/8 ESA dose for discharge: mircera 225 mcg IV q 2 weeks - give on 6/13, did not get on 6/11. IV Iron dose at discharge: none  Heparin change: none  EDW Change: no New EDW:   [place] Change: no  Access intervention/Change: no Details:  Hectorol/Calcitriol change: no  Discharge Labs: Calcium8.6 Phosphorus 4.2 Albumin 2.9 K+ 4.5  IV Antibiotics: none Details:  On Coumadin?: no Last INR: Next INR: Managed By:   OTHER/APPTS/LAB ORDERS:    D/C Meds to be reconciled by nurse after every discharge.  Completed By:   Reviewed by: MD:______ RN_______

## 2022-11-27 NOTE — Progress Notes (Signed)
[facility] Progress Note   Subjective:   Patient seen and examined at bedside in dialysis.  Tolerating treatment well so far.  Reports ongoing abdominal pain with no change in severity.  Denies CP, SOB, abdominal pain and n/v/d.    Objective Vitals:   11/27/22 1000 11/27/22 1100 11/27/22 1130 11/27/22 1151  BP: 100/70 (!) 117/57 104/67 112/89  Pulse: 65 76 79 84  Resp: 10 (!) 27 15 19   Temp:      TempSrc:      SpO2: 99% 99% 100% 100%  Weight:      Height:       Physical Exam General:chronically ill appearing male in NAD Heart:IRIR Lungs:CTAB anteriorly, nml WOB on RA Abdomen:soft, +distended, NT Extremities:no LE edema Dialysis Access: RU AVF in use   Filed Weights   11/24/22 0735 11/24/22 0744 11/24/22 1128  Weight: 67 kg 67 kg 64.8 kg    Intake/Output Summary (Last 24 hours) at 11/27/2022 1215 Last data filed at 11/26/2022 2218 Gross per 24 hour  Intake 480 ml  Output 0 ml  Net 480 ml    Additional Objective Labs: Basic Metabolic Panel: Recent Labs  Lab 11/22/22 0630 11/24/22 0744 11/26/22 0350 11/27/22 0745  NA 128* 126* 127* 126*  K 4.6 4.2 4.4 4.5  CL 88* 88* 88* 90*  CO2 24 24 23 24   GLUCOSE 95 92 102* 84  BUN 61* 42* 47* 68*  CREATININE 7.80* 7.21* 7.14* 8.61*  CALCIUM 8.1* 8.3* 8.3* 8.6*  PHOS 6.1* 4.2  --  4.2   Liver Function Tests: Recent Labs  Lab 11/20/22 1330 11/21/22 1800 11/22/22 0630 11/24/22 0744 11/26/22 0350 11/27/22 0745  AST 61* 62*  --   --  20  --   ALT 40 45*  --   --  20  --   ALKPHOS 230* 194*  --   --  187*  --   BILITOT 2.2* 1.9*  --   --  1.1  --   PROT 7.9 7.8  --   --  6.9  --   ALBUMIN 3.6 3.4*   < > 2.9* 3.0* 2.9*   < > = values in this interval not displayed.   Recent Labs  Lab 11/20/22 1408  LIPASE 33   CBC: Recent Labs  Lab 11/22/22 0630 11/22/22 1618 11/23/22 1241 11/24/22 0301 11/24/22 1521 11/26/22 0350 11/27/22 0745  WBC 4.7  --  4.3 4.4  --  5.1 4.6  HGB 6.4*   < > 8.0*  6.8* 8.7* 8.7* 8.3*  HCT 17.4*   < > 22.4* 19.0* 24.8* 25.1* 23.5*  MCV 100.0  --  100.9* 98.4  --  100.0 100.4*  PLT 113*  --  115* 109*  --  127* 122*   < > = values in this interval not displayed.   CBG: Recent Labs  Lab 11/22/22 0535 11/22/22 1624 11/24/22 1202  GLUCAP 85 75 79   Iron Studies:  Recent Labs    11/26/22 0350  FERRITIN 1,159*   Lab Results  Component Value Date   INR 1.5 (H) 10/21/2022   INR 1.3 (H) 06/04/2022   INR 1.3 (H) 05/17/2022   Studies/Results: No results found.  Medications:   allopurinol  100 mg Oral Daily   Chlorhexidine Gluconate Cloth  6 each Topical Q0600   cinacalcet  30 mg Oral Q T,Th,Sa-HD   doxercalciferol  7 mcg Intravenous Q T,Th,Sa-HD   ferric citrate  420 mg Oral TID WC  And   ferric citrate  210 mg Oral With snacks   folic acid  1 mg Oral Daily   hydroxyurea  500 mg Oral Daily   lidocaine-prilocaine  1 Application Topical Q T,Th,Sa-HD   oxyCODONE  10 mg Oral Q12H   pantoprazole  40 mg Oral BID AC   sodium chloride flush  3 mL Intravenous Q12H   torsemide  40 mg Oral BID    Dialysis Orders: AF, T,Th,S 4 hr 15 min 180NRe 400/500 60.5 kg 2.0 K/ 2.0 Ca UFP 2 AVF - No Heparin - Sensipar 30 mg PO TIW - Hectorol 7 mcg IV TIW - Mircera 225 mcg IV 11/13/2022 Next dose due 11/27/2022   Assessment/Plan: Sickle Cell Crisis-management per primary Abdominal pain/bloating - non tender, etiology unclear. ?ascites    Symptomatic Anemia- W/U per primary. Heme + stools. Seen by GI, no endoscopic W/U planned. S/p CT ABD/pelvis: stable ascites, stable r-sided pleural effusion, and early cirrhosis. So far has received 8.3. Transfuse for Hgb < 7.  Persistent Afib-rate controlled. No AC D/T GIB  ESRD -  T,Th,S. Next HD 11/27/22.  Hypotension/volume  - Hypotension.  Not on midodrine.  Can use albumin w/HD for BP support. Not to dry, continue UF as tolerated. Continue Torsemide 40 mg PO BID.  Anemia  - As noted above. Metabolic bone  disease -  Calcium and phos in goal. Continue cinacalcet, Auryxia binders, hectorol.  Nutrition - Full liquids at present. Renal diet with fluid restrictions Albumin 3.8 11/01/2022 (OP clinic)  Chronic pain on opoid therapy- lactulose PRN constipation. Management per primary  Virgina Norfolk, PA-C [facility] 11/27/2022,12:15 PM  LOS: 6 days

## 2022-11-27 NOTE — TOC Transition Note (Signed)
Transition of Care [facility]) - CM/SW Discharge Note   Patient Details  Name: Patrick [name] MRN: [id number] Date of Birth: 05-25-1956  Transition of Care [facility]) CM/SW Contact:  Tom-Johnson, Hershal Coria, RN Phone Number: 11/27/2022, 3:44 PM   Clinical Narrative:     Patient is scheduled for discharge today.  Readmission Risk Assessment done. Outpatient f/u, hospital f/u and discharge instructions on AVS. No TOC needs or recommendations noted. Nephew to transport at discharge.  No further TOC needs noted.       Final next level of care: Home/Self Care Barriers to Discharge: Barriers Resolved   Patient Goals and CMS Choice CMS Medicare.gov Compare Post Acute Care list provided to:: Patient Choice offered to / list presented to : NA  Discharge Placement                  Patient to be transferred to facility by: Nephew      Discharge Plan and Services Additional resources added to the After Visit Summary for                  DME Arranged: N/A DME Agency: NA       HH Arranged: NA HH Agency: NA        Social Determinants of Health (SDOH) Interventions SDOH Screenings   Food Insecurity: No Food Insecurity (11/20/2022)  Housing: Low Risk  (11/20/2022)  Transportation Needs: No Transportation Needs (11/20/2022)  Utilities: Not At Risk (11/20/2022)  Alcohol Screen: Low Risk  (09/27/2022)  Depression (PHQ2-9): Low Risk  (10/30/2022)  Recent Concern: Depression (PHQ2-9) - High Risk (10/02/2022)  Financial Resource Strain: Low Risk  (09/27/2022)  Physical Activity: Inactive (09/27/2022)  Social Connections: Socially Isolated (09/27/2022)  Stress: No Stress Concern Present (09/27/2022)  Tobacco Use: Medium Risk (11/20/2022)     Readmission Risk Interventions    11/27/2022    3:44 PM 10/23/2022   12:41 PM 07/24/2022    1:28 PM  Readmission Risk Prevention Plan  Transportation Screening Complete Complete Complete  Medication Review (RN Care Manager) Referral to  Pharmacy Complete Complete  PCP or Specialist appointment within 3-5 days of discharge Complete Complete Complete  HRI or Home Care Consult Complete Complete Complete  SW Recovery Care/Counseling Consult Complete Complete   Palliative Care Screening Not Applicable Not Applicable Not Applicable  Skilled Nursing Facility Not Applicable Not Applicable Not Applicable

## 2022-11-27 NOTE — Procedures (Signed)
HD Note:  Some information was entered later than the data was gathered due to patient care needs. The stated time with the data is accurate.  Received patient in bed to unit.  Alert and oriented.  Informed consent signed and in chart.   TX duration: 4 hours  Patient tolerated well. Patient had pain issues ongoing due to admitting diagnosis of Sickle Cell crisis.  See MAR  Transported back to the room  Alert, without acute distress.  Hand-off given to patient's nurse.   Access used: Right upper arm fistula Access issues: None  Total UF removed: 2500 ml   [facility]

## 2022-11-27 NOTE — Discharge Summary (Signed)
Physician Discharge Summary  Patrick [name]:[id number] DOB: [birth year] DOA: 11/20/2022  PCP: Quentin Angst, MD  Admit date: 11/20/2022  Discharge date: 11/27/2022  Discharge Diagnoses:  Principal Problem:   Sickle cell pain crisis (HCC) Active Problems:   Sickle cell crisis (HCC)   Sickle cell disease (HCC)   ESRD on hemodialysis (HCC)   Permanent atrial fibrillation (HCC)   Chronic systolic CHF (congestive heart failure) (HCC)   COPD (chronic obstructive pulmonary disease) (HCC)   Symptomatic anemia   GI bleed   Discharge Condition: Stable  Disposition:   Follow-up Information     Quentin Angst, MD Follow up in 1 week(s).   Specialty: Internal Medicine Contact information: [address] [place] [address] [phone]                Pt is discharged home in good condition and is to follow up with Quentin Angst, MD this week to have labs evaluated. Patrick [name] is instructed to increase activity slowly and balance with rest for the next few days, and use prescribed medication to complete treatment of pain  Diet: Regular Wt Readings from Last 3 Encounters:  11/24/22 64.8 kg  10/30/22 63 kg  10/22/22 62.6 kg    History of present illness:  Patrick [name] is a 67 year old male with a medical history significant for sickle cell disease persistent atrial fibrillation not on anticoagulation due to bleeding, systolic CHF, ESRD on HD (TTS), COPD, hypotension, chronic pain syndrome, history of GI bleed secondary to angiodysplasia who presented to the emergency department with complaints pain after his hemodialysis session.  It is noted that patient had just recently been hospitalized from 5/4 - 5/7 due to symptomatic anemia requiring blood transfusion.  EGD performed by Dr. Rhea Belton on 10/22/2022 noted normal esophagus, 2 cm hiatal hernia, acute gastritis with ulcerations and erosions (biopsies taken, negative for H. pylori), and normal  duodenum gastritis with gastric ulcers and erosions.  The patient reports having severe pain in his legs and arms was "hitting him with a sledgehammer".  Also, complained of having chest pain and tightness, abdominal pain, and some shortness of breath.  Denies any use of NSAIDs.  Patient has not had any recent fever or chills.  He is unsure if he has had any blood in his stools due to his poor vision.  In the emergency department, patient was noted to be afebrile with heart rates 100-128, blood pressure is 101/51 to 106/73, and all other vital signs maintain.  Labs significant for hemoglobin 6.3, platelets 118, potassium 3.4, BUN 45, creatinine 5.63, anion gap 17.  Stool guaiacs noted to be positive.  Patient has been ordered to be transfused 1 unit of packed red blood cells.  Hospital Course:  Symptomatic anemia: On admission, patient's hemoglobin was 6.3 g/dL transfuse 1 unit PRBCs.  He received an additional 2 units HD without complication.  Prior to discharge, patient's hemoglobin is 8.3 g/dL.  He will follow-up at [facility] on 11/29/2022 to repeat CBC with differential.  Patient denies any dizziness, shortness of breath, or fatigue.  Sickle cell disease with pain crisis: Patient's pain was slow to respond to medication regimen.  He was initially started on IV Dilaudid PCA, was discontinued due to hypoxia.  Pain managed with OxyContin 10 mg every 12 hours, and oxycodone every 4 hours as needed for severe breakthrough pain.  Patient will resume home pain medication.  He will continue to follow-up with  his PCP for medication management. Patient's pain intensity is 5/10 and he is requesting discharge home.  He feels that he can manage at home this medication regimen. He was advised to notify sickle cell day hospital if pain intensity increases.  Patient expressed understanding.  ESRD on HD (TTS): Patient received HD this am without complication. He will continue current schedule.   Patient followed by nephrology throughout admission.    Possible GI bleed: On admission, guaiac positive.  Reviewed CT, no acute findings.  Patient was evaluated by GI, he will follow-up as scheduled.  Patient was therefore discharged home today in a hemodynamically stable condition.   Jader will follow-up with PCP within 1 week of this discharge. Vannie was counseled extensively about nonpharmacologic means of pain management, patient verbalized understanding and was appreciative of  the care received during this admission.   We discussed the need for good hydration, monitoring of hydration status, avoidance of heat, cold, stress, and infection triggers. We discussed the need to be adherent with taking Hydrea and other home medications. Patient was reminded of the need to seek medical attention immediately if any symptom of bleeding, anemia, or infection occurs.  Discharge Exam: Vitals:   11/27/22 1217 11/27/22 1240  BP: 106/67 112/74  Pulse: 74 79  Resp: 17 19  Temp: 97.8 F (36.6 C) (!) 97.4 F (36.3 C)  SpO2: 99% 100%   Vitals:   11/27/22 1130 11/27/22 1151 11/27/22 1217 11/27/22 1240  BP: 104/67 112/89 106/67 112/74  Pulse: 79 84 74 79  Resp: 15 19 17 19   Temp:   97.8 F (36.6 C) (!) 97.4 F (36.3 C)  TempSrc:    Oral  SpO2: 100% 100% 99% 100%  Weight:      Height:       Physical Exam Constitutional:      Appearance: He is well-developed.  Cardiovascular:     Rate and Rhythm: Rhythm irregular.     Heart sounds: Murmur heard.     Diastolic murmur is present.  Pulmonary:     Breath sounds: Normal breath sounds.  Abdominal:     Palpations: Abdomen is soft.     Tenderness: There is no abdominal tenderness. There is no guarding.  Musculoskeletal:        General: Normal range of motion.  Skin:    General: Skin is warm.  Neurological:     Mental Status: He is oriented to person, place, and time.      Discharge Instructions  Discharge Instructions      Discharge patient   Complete by: As directed    Discharge disposition: 01-Home or Self Care   Discharge patient date: 11/27/2022      Allergies as of 11/27/2022   No Known Allergies      Medication List     TAKE these medications    acetaminophen 500 MG tablet Commonly known as: TYLENOL Take 1,500 mg by mouth 3 (three) times daily as needed for mild pain, moderate pain, fever or headache.   allopurinol 100 MG tablet Commonly known as: ZYLOPRIM Take 1 tablet (100 mg total) by mouth daily.   Auryxia 1 GM 210 MG(Fe) tablet Generic drug: ferric citrate Take 210-420 mg by mouth See admin instructions. 420 mg with every meal, 210 mg with each snack.   cinacalcet 30 MG tablet Commonly known as: SENSIPAR Take 1 tablet (30 mg total) by mouth Every Tuesday,Thursday,and Saturday with dialysis.   Constulose 10 GM/15ML solution Generic drug: lactulose Take 30  mLs (20 g total) by mouth 2 (two) times daily as needed for mild constipation.   diphenhydramine-acetaminophen 25-500 MG Tabs tablet Commonly known as: TYLENOL PM Take 2 tablets by mouth at bedtime as needed (sleep/pain).   folic acid 1 MG tablet Commonly known as: FOLVITE TAKE 1 TABLET BY MOUTH EVERY DAY   GERITOL PO Take 15 mLs by mouth daily.   hydroxyurea 500 MG capsule Commonly known as: HYDREA Take 1 capsule (500 mg total) by mouth daily.   lidocaine-prilocaine cream Commonly known as: EMLA Apply 1 application. topically Every Tuesday,Thursday,and Saturday with dialysis.   oxyCODONE 10 mg 12 hr tablet Commonly known as: OXYCONTIN Take 1 tablet (10 mg total) by mouth every 12 (twelve) hours.   Oxycodone HCl 10 MG Tabs Take 1 tablet (10 mg total) by mouth every 6 (six) hours as needed.   pantoprazole 40 MG tablet Commonly known as: PROTONIX Take 1 tablet (40 mg total) by mouth 2 (two) times daily. X 2 months, then decrease to 1 tablet by mouth once daily therafter   pantoprazole 40 MG tablet Commonly  known as: PROTONIX Take 1 tablet (40 mg total) by mouth daily. Use this prescription after done with twice daily dosing x 2 months   tiZANidine 4 MG tablet Commonly known as: ZANAFLEX TAKE 1/2 TABLET (2MG  TOTAL) BY MOUTH EVERY 8 HOURS AS NEEDED FOR MUSCLE SPASM   Torsemide 40 MG Tabs Take 40 mg by mouth in the morning and at bedtime.        The results of significant diagnostics from this hospitalization (including imaging, microbiology, ancillary and laboratory) are listed below for reference.    Significant Diagnostic Studies: CT ABDOMEN PELVIS WO CONTRAST  Result Date: 11/22/2022 CLINICAL DATA:  Acute abdominal pain on the right, initial encounter EXAM: CT ABDOMEN AND PELVIS WITHOUT CONTRAST TECHNIQUE: Multidetector CT imaging of the abdomen and pelvis was performed following the standard protocol without IV contrast. RADIATION DOSE REDUCTION: This exam was performed according to the departmental dose-optimization program which includes automated exposure control, adjustment of the mA and/or kV according to patient size and/or use of iterative reconstruction technique. COMPARISON:  10/20/2022 FINDINGS: Lower chest: Small right-sided pleural effusion is noted similar to that seen on the prior exam. Heart remains enlarged in size. Hepatobiliary: Liver demonstrates mild nodularity. Status post cholecystectomy. No biliary dilatation. Pancreas: Unremarkable. No pancreatic ductal dilatation or surrounding inflammatory changes. Spleen: Spleen is calcified and atrophic but stable from the prior exam. This is consistent with auto infarction. Adrenals/Urinary Tract: Adrenal glands are within normal limits. Kidneys are well visualized bilaterally. Renal vascular calcifications are seen. No calculi are noted. Small cysts are seen. No follow-up is recommended. The bladder is decompressed. Stomach/Bowel: The appendix is within normal limits. Contrast is noted scattered throughout the colon without obstructive  change. Stomach and small bowel are within normal limits. Vascular/Lymphatic: Aortic atherosclerosis. No enlarged abdominal or pelvic lymph nodes. Reproductive: Prostate is unremarkable. Other: Ascites is noted primarily within the lesser sac but also left upper quadrant similar to that seen on the prior exam. No focal hernia is seen. Musculoskeletal: Sclerotic changes of the bones are noted consistent with the known history of sickle cell disease. IMPRESSION: Stable appearing ascites. Small right-sided pleural effusion stable from the prior study. Early cirrhosis of the liver. Electronically Signed   By: Alcide Clever M.D.   On: 11/22/2022 22:19   DG Chest 2 View  Result Date: 11/20/2022 CLINICAL DATA:  Chest pain. EXAM: CHEST - 2 VIEW  COMPARISON:  10/20/2022. FINDINGS: Stable chronic interstitial prominence. No consolidation or pulmonary edema. Stable cardiomegaly and mediastinal contours. No pleural effusion or pneumothorax. Visualized bones and upper abdomen are unremarkable. IMPRESSION: No evidence of acute cardiopulmonary disease. Electronically Signed   By: Orvan Falconer M.D.   On: 11/20/2022 13:07   DG Knee Complete 4 Views Right  Result Date: 11/06/2022 CLINICAL DATA:  Right knee pain. EXAM: RIGHT KNEE - COMPLETE 4+ VIEW COMPARISON:  09/17/2013. FINDINGS: No acute fracture or dislocation. Mild-to-moderate tricompartmental degenerative changes are noted. There is a moderate suprapatellar joint effusion. Vascular calcifications are present in the soft tissues. IMPRESSION: 1. Mild-to-moderate tricompartmental degenerative changes. 2. Moderate suprapatellar joint effusion. Electronically Signed   By: Thornell Sartorius M.D.   On: 11/06/2022 22:30    Microbiology: Recent Results (from the past 240 hour(s))  MRSA Next Gen by PCR, Nasal     Status: None   Collection Time: 11/20/22  9:50 PM   Specimen: Nasal Mucosa; Nasal Swab  Result Value Ref Range Status   MRSA by PCR Next Gen NOT DETECTED NOT  DETECTED Final    Comment: (NOTE) The GeneXpert MRSA Assay (FDA approved for NASAL specimens only), is one component of a comprehensive MRSA colonization surveillance program. It is not intended to diagnose MRSA infection nor to guide or monitor treatment for MRSA infections. Test performance is not FDA approved in patients less than 41 years old. Performed at [facility], [address] [place], [place] [address]      Labs: Basic Metabolic Panel: Recent Labs  Lab 11/21/22 0430 11/22/22 0630 11/24/22 0744 11/26/22 0350 11/27/22 0745  NA 134* 128* 126* 127* 126*  K 4.3 4.6 4.2 4.4 4.5  CL 92* 88* 88* 88* 90*  CO2 26 24 24 23 24   GLUCOSE 88 95 92 102* 84  BUN 51* 61* 42* 47* 68*  CREATININE 6.68* 7.80* 7.21* 7.14* 8.61*  CALCIUM 7.8* 8.1* 8.3* 8.3* 8.6*  PHOS  --  6.1* 4.2  --  4.2   Liver Function Tests: Recent Labs  Lab 11/21/22 1800 11/22/22 0630 11/24/22 0744 11/26/22 0350 11/27/22 0745  AST 62*  --   --  20  --   ALT 45*  --   --  20  --   ALKPHOS 194*  --   --  187*  --   BILITOT 1.9*  --   --  1.1  --   PROT 7.8  --   --  6.9  --   ALBUMIN 3.4* 3.0* 2.9* 3.0* 2.9*   No results for input(s): "LIPASE", "AMYLASE" in the last 168 hours. No results for input(s): "AMMONIA" in the last 168 hours. CBC: Recent Labs  Lab 11/22/22 0630 11/22/22 1618 11/23/22 1241 11/24/22 0301 11/24/22 1521 11/26/22 0350 11/27/22 0745  WBC 4.7  --  4.3 4.4  --  5.1 4.6  HGB 6.4*   < > 8.0* 6.8* 8.7* 8.7* 8.3*  HCT 17.4*   < > 22.4* 19.0* 24.8* 25.1* 23.5*  MCV 100.0  --  100.9* 98.4  --  100.0 100.4*  PLT 113*  --  115* 109*  --  127* 122*   < > = values in this interval not displayed.   Cardiac Enzymes: No results for input(s): "CKTOTAL", "CKMB", "CKMBINDEX", "TROPONINI" in the last 168 hours. BNP: Invalid input(s): "POCBNP" CBG: Recent Labs  Lab 11/22/22 0535 11/22/22 1624 11/24/22 1202  GLUCAP 85 75 79    Time coordinating discharge: 50  minutes  Signed:  Nolon Nations  APRN, MSN, FNP-C Patient Care [facility] [address] [place], [place] [address] [phone]  [facility] 11/27/2022, 3:04 PM

## 2022-11-27 NOTE — Progress Notes (Signed)
D/C order noted. Contacted [facility] to advise clinic of pt's d/c today and that pt should resume care on Thursday.   Olivia Canter Renal Navigator [phone]

## 2022-11-28 ENCOUNTER — Telehealth: Payer: Self-pay | Admitting: Nephrology

## 2022-11-28 ENCOUNTER — Telehealth: Payer: Self-pay

## 2022-11-28 LAB — BPAM RBC
Blood Product Expiration Date: 202406212359
Unit Type and Rh: 600
Unit Type and Rh: 6200

## 2022-11-28 LAB — TYPE AND SCREEN
ABO/RH(D): AB POS
Antibody Screen: NEGATIVE
Unit division: 0
Unit division: 0

## 2022-11-28 NOTE — Telephone Encounter (Signed)
Transition of Care Contact from Inpatient Facility  Date of Discharge: 11/27/22 Date of Contact: 11/28/22 Method of contact: phone - attempted  Attempted to contact patient to discuss transition of care from inpatient admission.  Patient did not answer the phone.  Will attempt to call them again and if unable to reach will follow up at dialysis.  Virgina Norfolk, PA-C [employer]

## 2022-11-28 NOTE — Transitions of Care (Post Inpatient/ED Visit) (Signed)
11/28/2022  Name: Patrick [name] MRN: [id number] DOB: [birth year]  Today's TOC FU Call Status: Today's TOC FU Call Status:: Successful TOC FU Call Competed TOC FU Call Complete Date: 11/28/22  Transition Care Management Follow-up Telephone Call Date of Discharge: 11/27/22 Discharge Facility: [facility]) Type of Discharge: Inpatient Admission Primary Inpatient Discharge Diagnosis:: "symptomatic anemia" How have you been since you were released from the hospital?: Better (Pt voice "doing ok." Denies any acute issues or concerrns at present. Current pain 4/10-to lower abd area. States he has not taken anything for pain today. Appetite good. BM this AM.) Any questions or concerns?: No  Items Reviewed: Did you receive and understand the discharge instructions provided?: Yes Medications obtained,verified, and reconciled?: Yes (Medications Reviewed) (Pt states he has not taken Sensipar in over a week-ran out of med-he will contact pharmacy/provider for refill. He voices he has already called and left message with MD about need for refill on Oxycodone prn med.) Any new allergies since your discharge?: No Dietary orders reviewed?: Yes Type of Diet Ordered:: renal/heart healthy Do you have support at home?: Yes People in Home: alone Name of Support/Comfort Primary Source: pt states his cousin helps him out as needed  Medications Reviewed Today: Medications Reviewed Today     Reviewed by Charlyn Minerva, RN (Registered Nurse) on 11/28/22 at 1429  Med List Status: <None>   Medication Order Taking? Sig Documenting Provider Last Dose Status Informant  acetaminophen (TYLENOL) 500 MG tablet [id number] Yes Take 1,500 mg by mouth 3 (three) times daily as needed for mild pain, moderate pain, fever or headache. [provider] Taking Active Self, Pharmacy Records           Med Note Sherilyn Cooter, [facility]   Wed Aug 01, 2022  1:25 PM) Prn   allopurinol (ZYLOPRIM) 100 MG tablet  [id number] Yes Take 1 tablet (100 mg total) by mouth daily. Ivonne Andrew, NP Taking Active   AURYXIA 1 GM 210 MG(Fe) tablet [id number] Yes Take 210-420 mg by mouth See admin instructions. 420 mg with every meal, 210 mg with each snack. [provider] Taking Active Self, Pharmacy Records           Med Note Lenor Derrick   Wed Nov 22, 2021  4:12 PM)    cinacalcet (SENSIPAR) 30 MG tablet [id number]  Take 1 tablet (30 mg total) by mouth Every Tuesday,Thursday,and Saturday with dialysis. Kendell Bane, MD  Active Self, Pharmacy Records  diphenhydramine-acetaminophen (TYLENOL PM) 25-500 MG TABS tablet [id number]  Take 2 tablets by mouth at bedtime as needed (sleep/pain). Ivonne Andrew, NP  Active Self, Pharmacy Records  folic acid (FOLVITE) 1 MG tablet [id number] Yes TAKE 1 TABLET BY MOUTH EVERY DAY Massie Maroon, FNP Taking Active   hydroxyurea (HYDREA) 500 MG capsule [id number] Yes Take 1 capsule (500 mg total) by mouth daily. Quentin Angst, MD Taking Active Self, Pharmacy Records  Iron-Vitamins (GERITOL PO) [id number] Yes Take 15 mLs by mouth daily. [provider] Taking Active Self, Pharmacy Records  lactulose (CHRONULAC) 10 GM/15ML solution [id number] Yes Take 30 mLs (20 g total) by mouth 2 (two) times daily as needed for mild constipation. Alberteen Sam, MD Taking Active   lidocaine-prilocaine (EMLA) cream [id number] Yes Apply 1 application. topically Every Tuesday,Thursday,and Saturday with dialysis. [provider] Taking Active Self, Pharmacy Records           Med Note Harrington Challenger Jul 31, 2021  7:55  PM)    oxyCODONE (OXYCONTIN) 10 mg 12 hr tablet [id number] Yes Take 1 tablet (10 mg total) by mouth every 12 (twelve) hours. Massie Maroon, FNP Taking Active   Oxycodone HCl 10 MG TABS [id number]  Take 1 tablet (10 mg total) by mouth every 6 (six) hours as needed. Massie Maroon, FNP  Active   pantoprazole (PROTONIX) 40 MG tablet  [id number] Yes Take 1 tablet (40 mg total) by mouth 2 (two) times daily. X 2 months, then decrease to 1 tablet by mouth once daily therafter Esterwood, Amy S, PA-C Taking Active   pantoprazole (PROTONIX) 40 MG tablet [id number] Yes Take 1 tablet (40 mg total) by mouth daily. Use this prescription after done with twice daily dosing x 2 months Esterwood, Amy S, PA-C Taking Active   tiZANidine (ZANAFLEX) 4 MG tablet [id number] Yes TAKE 1/2 TABLET (2MG  TOTAL) BY MOUTH EVERY 8 HOURS AS NEEDED FOR MUSCLE SPASM Massie Maroon, FNP Taking Active   Torsemide 40 MG TABS [id number]  Take 40 mg by mouth in the morning and at bedtime. Ivonne Andrew, NP  Expired 11/20/22 2359 Self, Pharmacy Records            Home Care and Equipment/Supplies: Were Home Health Services Ordered?: NA Any new equipment or medical supplies ordered?: NA  Functional Questionnaire: Do you need assistance with bathing/showering or dressing?: No Do you need assistance with meal preparation?: No Do you need assistance with eating?: No Do you have difficulty maintaining continence: No Do you need assistance with getting out of bed/getting out of a chair/moving?: No Do you have difficulty managing or taking your medications?: No  Follow up appointments reviewed: PCP Follow-up appointment confirmed?: Yes Date of PCP follow-up appointment?: 12/04/22 Follow-up Provider: [facility] Follow-up appointment confirmed?: NA Do you need transportation to your follow-up appointment?: No Do you understand care options if your condition(s) worsen?: Yes-patient verbalized understanding  SDOH Interventions Today    Flowsheet Row Most Recent Value  SDOH Interventions   Food Insecurity Interventions Intervention Not Indicated  Transportation Interventions Intervention Not Indicated  [pt states he uses [facility] for medical transport]      TOC Interventions Today    Flowsheet Row Most Recent Value  TOC  Interventions   TOC Interventions Discussed/Reviewed TOC Interventions Discussed, Arranged PCP follow up within 7 days/Care Guide scheduled      Interventions Today    Flowsheet Row Most Recent Value  General Interventions   General Interventions Discussed/Reviewed General Interventions Discussed, Doctor Visits  Doctor Visits Discussed/Reviewed Doctor Visits Discussed, PCP, Specialist  PCP/Specialist Visits Compliance with follow-up visit  Education Interventions   Education Provided Provided Education  Provided Verbal Education On Nutrition, When to see the doctor, Medication, Other  [pain/sx mgmt]  Nutrition Interventions   Nutrition Discussed/Reviewed Nutrition Discussed  Pharmacy Interventions   Pharmacy Dicussed/Reviewed Medications and their functions, Pharmacy Topics Discussed  Safety Interventions   Safety Discussed/Reviewed Safety Discussed       Alessandra Grout [facility] Care Management Care Management Community Coordinator Direct Phone: [phone] Toll Free: [phone] Fax: [phone]

## 2022-11-29 ENCOUNTER — Other Ambulatory Visit: Payer: Self-pay | Admitting: Family Medicine

## 2022-11-29 ENCOUNTER — Telehealth: Payer: Self-pay | Admitting: Internal Medicine

## 2022-11-29 DIAGNOSIS — N186 End stage renal disease: Secondary | ICD-10-CM | POA: Diagnosis not present

## 2022-11-29 DIAGNOSIS — D571 Sickle-cell disease without crisis: Secondary | ICD-10-CM | POA: Diagnosis not present

## 2022-11-29 DIAGNOSIS — D57219 Sickle-cell/Hb-C disease with crisis, unspecified: Secondary | ICD-10-CM | POA: Diagnosis not present

## 2022-11-29 DIAGNOSIS — D631 Anemia in chronic kidney disease: Secondary | ICD-10-CM | POA: Diagnosis not present

## 2022-11-29 DIAGNOSIS — L299 Pruritus, unspecified: Secondary | ICD-10-CM | POA: Diagnosis not present

## 2022-11-29 DIAGNOSIS — N2581 Secondary hyperparathyroidism of renal origin: Secondary | ICD-10-CM | POA: Diagnosis not present

## 2022-11-29 DIAGNOSIS — Z992 Dependence on renal dialysis: Secondary | ICD-10-CM | POA: Diagnosis not present

## 2022-11-29 DIAGNOSIS — D57819 Other sickle-cell disorders with crisis, unspecified: Secondary | ICD-10-CM | POA: Diagnosis not present

## 2022-11-29 DIAGNOSIS — D5700 Hb-SS disease with crisis, unspecified: Secondary | ICD-10-CM | POA: Diagnosis not present

## 2022-11-29 MED ORDER — OXYCODONE HCL 10 MG PO TABS
10.00 mg | ORAL_TABLET | Freq: Four times a day (QID) | ORAL | 0 refills | Status: DC | PRN
Start: 2022-11-29 — End: 2022-12-25

## 2022-11-29 NOTE — Progress Notes (Signed)
Reviewed PDMP substance reporting system prior to prescribing opiate medications. No inconsistencies noted.  Meds ordered this encounter  Medications   Oxycodone HCl 10 MG TABS    Sig: Take 1 tablet (10 mg total) by mouth every 6 (six) hours as needed.    Dispense:  60 tablet    Refill:  0    Order Specific Question:   Supervising Provider    Answer:   JEGEDE, OLUGBEMIGA E [[id number]]   Manreet Kiernan Moore Felesia Stahlecker  APRN, MSN, FNP-C [facility] [address]  [place], [place] [address] [phone]  

## 2022-11-29 NOTE — Telephone Encounter (Signed)
Caller & Relationship to patient:  MRN #  [id number]   Call Back Number:   Date of Last Office Visit: 11/19/2022     Date of Next Office Visit: 12/04/2022    Medication(s) to be Refilled: Oxycodone  Preferred Pharmacy:   ** Please notify patient to allow 48-72 hours to process** **Let patient know to contact pharmacy at the end of the day to make sure medication is ready. ** **If patient has not been seen in a year or longer, book an appointment **Advise to use MyChart for refill requests OR to contact their pharmacy

## 2022-11-30 DIAGNOSIS — N186 End stage renal disease: Secondary | ICD-10-CM | POA: Diagnosis not present

## 2022-11-30 DIAGNOSIS — D571 Sickle-cell disease without crisis: Secondary | ICD-10-CM | POA: Diagnosis not present

## 2022-11-30 DIAGNOSIS — Z515 Encounter for palliative care: Secondary | ICD-10-CM | POA: Diagnosis not present

## 2022-12-03 ENCOUNTER — Telehealth: Payer: Self-pay | Admitting: *Deleted

## 2022-12-03 NOTE — Progress Notes (Signed)
  Care Coordination Note  12/03/2022 Name: RHOEN LANGILL MRN: [id number] DOB: [birth year]  Servando D Marlatt is a 67 y.o. year old male who is a primary care patient of Jegede, [name], MD and is actively engaged with the care management team. I reached out to Norris D Jeannette How by phone today to assist with re-scheduling a follow up visit with the RN Case Manager  Follow up plan: Unsuccessful telephone outreach attempt made. A HIPAA compliant phone message was left for the patient providing contact information and requesting a return call.   Burman Nieves, CCMA Care Coordination Care Guide Direct Dial: [phone]

## 2022-12-04 ENCOUNTER — Ambulatory Visit (INDEPENDENT_AMBULATORY_CARE_PROVIDER_SITE_OTHER): Payer: PPO | Admitting: Nurse Practitioner

## 2022-12-04 ENCOUNTER — Encounter: Payer: Self-pay | Admitting: Nurse Practitioner

## 2022-12-04 VITALS — BP 96/55 | HR 64 | Temp 97.1°F | Ht 71.0 in | Wt 141.0 lb

## 2022-12-04 DIAGNOSIS — D5700 Hb-SS disease with crisis, unspecified: Secondary | ICD-10-CM | POA: Diagnosis not present

## 2022-12-04 DIAGNOSIS — N2581 Secondary hyperparathyroidism of renal origin: Secondary | ICD-10-CM | POA: Diagnosis not present

## 2022-12-04 DIAGNOSIS — N186 End stage renal disease: Secondary | ICD-10-CM | POA: Diagnosis not present

## 2022-12-04 DIAGNOSIS — G894 Chronic pain syndrome: Secondary | ICD-10-CM

## 2022-12-04 DIAGNOSIS — Z992 Dependence on renal dialysis: Secondary | ICD-10-CM | POA: Diagnosis not present

## 2022-12-04 DIAGNOSIS — D571 Sickle-cell disease without crisis: Secondary | ICD-10-CM | POA: Diagnosis not present

## 2022-12-04 DIAGNOSIS — L299 Pruritus, unspecified: Secondary | ICD-10-CM | POA: Diagnosis not present

## 2022-12-04 DIAGNOSIS — Z09 Encounter for follow-up examination after completed treatment for conditions other than malignant neoplasm: Secondary | ICD-10-CM | POA: Diagnosis not present

## 2022-12-04 DIAGNOSIS — D509 Iron deficiency anemia, unspecified: Secondary | ICD-10-CM | POA: Diagnosis not present

## 2022-12-04 DIAGNOSIS — D57819 Other sickle-cell disorders with crisis, unspecified: Secondary | ICD-10-CM | POA: Diagnosis not present

## 2022-12-04 DIAGNOSIS — D631 Anemia in chronic kidney disease: Secondary | ICD-10-CM | POA: Diagnosis not present

## 2022-12-04 DIAGNOSIS — D57219 Sickle-cell/Hb-C disease with crisis, unspecified: Secondary | ICD-10-CM | POA: Diagnosis not present

## 2022-12-04 NOTE — Assessment & Plan Note (Signed)
Patient reports doing much better today Continue folic acid 1 mg daily and hydroxyurea 500 mg daily Continue OxyContin 10 mg twice daily, oxycodone 10 mg every 6 hours as needed for chronic pain Importance of hydration and avoiding triggers of infection discussed Follow-up with PCP in 2 months

## 2022-12-04 NOTE — Progress Notes (Signed)
Established Patient Office Visit  Subjective:  Patient ID: Patrick [name], male    DOB: May 26, 1956  Age: 67 y.o. MRN: [id number]  CC:  Chief Complaint  Patient presents with   Follow-up    Hospital follow up.    HPI [name] is a 67 y.o. male  has a past medical history of A-fib (HCC), Blood dyscrasia, Blood transfusion, CHF (congestive heart failure) (HCC), Chronic kidney disease, Dialysis patient (HCC) (04/2019), Dysrhythmia, Early satiety, Elevated LFTs (04/01/2022), Elevated troponin (04/01/2022), Empyema of gallbladder (05/27/2021), GERD (gastroesophageal reflux disease), Gout, Legally blind, Metabolic acidosis with increased anion gap and accumulation of organic acids (04/01/2022), Mitral regurgitation, Muscle tightness-Right arm  (05/27/2014), Pneumonia, Pulmonary hypertension (HCC), Shortness of breath dyspnea, Sickle cell disease, type SS (HCC), Swelling abdomen, Swelling of extremity, left, Swelling of extremity, right, and Tricuspid regurgitation.  Patient presents for hospital discharge follow-up.  Patient was on admission at the hospital from June for 2024 to November 27, 2022.  Patient stated that he feels much better since he left the hospital he denies fever, chills chest pain, shortness of breath, abdominal pain, nausea, vomiting.  Taking all medications as ordered. He continues on hemodialysis 3 days a week, had dialysis today.  Has aching pain rated 3/10 on his right knee, taking oxycodone as ordered.  Patient received a total of 3 units of PRBCs while at the hospital. He denies any adverse reactions to current medications   Past Medical History:  Diagnosis Date   A-fib (HCC)    Blood dyscrasia    Blood transfusion    "I've had a bunch of them"   CHF (congestive heart failure) (HCC)    followed by hf clinic   Chronic kidney disease    Stage 5, dialysis on Tu/Th/Sa   Dialysis patient (HCC) 04/2019   AV fistula (right arm), TTS   Dysrhythmia    A-Fib per  patient   Early satiety    Elevated LFTs 04/01/2022   Elevated troponin 04/01/2022   Empyema of gallbladder 05/27/2021   GERD (gastroesophageal reflux disease)    Gout    Legally blind    Metabolic acidosis with increased anion gap and accumulation of organic acids 04/01/2022   Mitral regurgitation    Muscle tightness-Right arm  05/27/2014   Pneumonia    Pulmonary hypertension (HCC)    Shortness of breath dyspnea    Sickle cell disease, type SS (HCC)    Swelling abdomen    Swelling of extremity, left    Swelling of extremity, right    Tricuspid regurgitation     Past Surgical History:  Procedure Laterality Date   A/V FISTULAGRAM Right 07/29/2020   Procedure: A/V FISTULAGRAM - Right Arm;  Surgeon: Chuck Hint, MD;  Location: [facility] INVASIVE CV LAB;  Service: Cardiovascular;  Laterality: Right;   BASCILIC VEIN TRANSPOSITION Right 04/12/2014   Procedure: BASILIC VEIN TRANSPOSITION;  Surgeon: Sherren Kerns, MD;  Location: [facility] OR;  Service: Vascular;  Laterality: Right;   BILIARY STENT PLACEMENT  04/04/2022   Procedure: BILIARY STENT PLACEMENT;  Surgeon: Meryl Dare, MD;  Location: [facility] ENDOSCOPY;  Service: Gastroenterology;;   BIOPSY  08/04/2020   Procedure: BIOPSY;  Surgeon: Meryl Dare, MD;  Location: [facility] ENDOSCOPY;  Service: Gastroenterology;;   BIOPSY  06/28/2021   Procedure: BIOPSY;  Surgeon: Beverley Fiedler, MD;  Location: [facility] ENDOSCOPY;  Service: Gastroenterology;;   BIOPSY  04/02/2022   Procedure: BIOPSY;  Surgeon: Lemar Lofty., MD;  Location:  [facility] ENDOSCOPY;  Service: Gastroenterology;;   BIOPSY  10/22/2022   Procedure: BIOPSY;  Surgeon: Beverley Fiedler, MD;  Location: [facility] ENDOSCOPY;  Service: Gastroenterology;;   BOWEL RESECTION N/A 05/24/2021   Procedure: SMALL BOWEL RESECTION;  Surgeon: Emelia Loron, MD;  Location: [facility] OR;  Service: General;  Laterality: N/A;   CARDIOVERSION N/A 06/05/2018   Procedure: CARDIOVERSION;  Surgeon: Dolores Patty,  MD;  Location: [facility] ENDOSCOPY;  Service: Cardiovascular;  Laterality: N/A;   CARDIOVERSION N/A 10/06/2019   Procedure: CARDIOVERSION;  Surgeon: Dolores Patty, MD;  Location: [facility] ENDOSCOPY;  Service: Cardiovascular;  Laterality: N/A;   CHOLECYSTECTOMY N/A 05/24/2021   Procedure: OPEN CHOLECYSTECTOMY;  Surgeon: Emelia Loron, MD;  Location: [facility] OR;  Service: General;  Laterality: N/A;   COLONOSCOPY WITH PROPOFOL N/A 09/24/2017   Procedure: COLONOSCOPY WITH PROPOFOL;  Surgeon: Lynann Bologna, MD;  Location: [facility] ENDOSCOPY;  Service: Endoscopy;  Laterality: N/A;   ENDOSCOPIC RETROGRADE CHOLANGIOPANCREATOGRAPHY (ERCP) WITH PROPOFOL N/A 05/17/2022   Procedure: ENDOSCOPIC RETROGRADE CHOLANGIOPANCREATOGRAPHY (ERCP) WITH PROPOFOL;  Surgeon: Meridee Score Netty Starring., MD;  Location: [facility] ENDOSCOPY;  Service: Gastroenterology;  Laterality: N/A;   ERCP N/A 04/04/2022   Procedure: ENDOSCOPIC RETROGRADE CHOLANGIOPANCREATOGRAPHY (ERCP);  Surgeon: Meryl Dare, MD;  Location: [facility] ENDOSCOPY;  Service: Gastroenterology;  Laterality: N/A;   ESOPHAGOGASTRODUODENOSCOPY N/A 09/19/2017   Procedure: ESOPHAGOGASTRODUODENOSCOPY (EGD);  Surgeon: Lynann Bologna, MD;  Location: [facility] ENDOSCOPY;  Service: Endoscopy;  Laterality: N/A;   ESOPHAGOGASTRODUODENOSCOPY N/A 05/29/2021   Procedure: ESOPHAGOGASTRODUODENOSCOPY (EGD);  Surgeon: Rachael Fee, MD;  Location: [facility] ENDOSCOPY;  Service: Gastroenterology;  Laterality: N/A;   ESOPHAGOGASTRODUODENOSCOPY (EGD) WITH PROPOFOL N/A 08/04/2020   Procedure: ESOPHAGOGASTRODUODENOSCOPY (EGD) WITH PROPOFOL;  Surgeon: Meryl Dare, MD;  Location: [facility] ENDOSCOPY;  Service: Gastroenterology;  Laterality: N/A;   ESOPHAGOGASTRODUODENOSCOPY (EGD) WITH PROPOFOL N/A 06/28/2021   Procedure: ESOPHAGOGASTRODUODENOSCOPY (EGD) WITH PROPOFOL;  Surgeon: Beverley Fiedler, MD;  Location: [facility] ENDOSCOPY;  Service: Gastroenterology;  Laterality: N/A;   ESOPHAGOGASTRODUODENOSCOPY (EGD) WITH PROPOFOL N/A  08/10/2021   Procedure: ESOPHAGOGASTRODUODENOSCOPY (EGD) WITH PROPOFOL;  Surgeon: Lynann Bologna, MD;  Location: [facility] ENDOSCOPY;  Service: Gastroenterology;  Laterality: N/A;   ESOPHAGOGASTRODUODENOSCOPY (EGD) WITH PROPOFOL N/A 08/02/2021   Procedure: ESOPHAGOGASTRODUODENOSCOPY (EGD) WITH PROPOFOL;  Surgeon: Shellia Cleverly, DO;  Location: [facility] ENDOSCOPY;  Service: Gastroenterology;  Laterality: N/A;   ESOPHAGOGASTRODUODENOSCOPY (EGD) WITH PROPOFOL N/A 04/02/2022   Procedure: ESOPHAGOGASTRODUODENOSCOPY (EGD) WITH PROPOFOL;  Surgeon: Meridee Score Netty Starring., MD;  Location: [facility] ENDOSCOPY;  Service: Gastroenterology;  Laterality: N/A;   ESOPHAGOGASTRODUODENOSCOPY (EGD) WITH PROPOFOL N/A 05/17/2022   Procedure: ESOPHAGOGASTRODUODENOSCOPY (EGD) WITH PROPOFOL;  Surgeon: Meridee Score Netty Starring., MD;  Location: [facility] ENDOSCOPY;  Service: Gastroenterology;  Laterality: N/A;   ESOPHAGOGASTRODUODENOSCOPY (EGD) WITH PROPOFOL N/A 10/22/2022   Procedure: ESOPHAGOGASTRODUODENOSCOPY (EGD) WITH PROPOFOL;  Surgeon: Beverley Fiedler, MD;  Location: [facility] ENDOSCOPY;  Service: Gastroenterology;  Laterality: N/A;   EUS N/A 05/17/2022   Procedure: UPPER ENDOSCOPIC ULTRASOUND (EUS) RADIAL;  Surgeon: Lemar Lofty., MD;  Location: [facility] ENDOSCOPY;  Service: Gastroenterology;  Laterality: N/A;   FINE NEEDLE ASPIRATION  04/02/2022   Procedure: FINE NEEDLE ASPIRATION (FNA) LINEAR;  Surgeon: Lemar Lofty., MD;  Location: [facility] ENDOSCOPY;  Service: Gastroenterology;;   FINE NEEDLE ASPIRATION N/A 05/17/2022   Procedure: FINE NEEDLE ASPIRATION (FNA) LINEAR;  Surgeon: Lemar Lofty., MD;  Location: [facility] ENDOSCOPY;  Service: Gastroenterology;  Laterality: N/A;   HEMOSTASIS CLIP PLACEMENT  05/29/2021   Procedure: HEMOSTASIS CLIP PLACEMENT;  Surgeon: Rachael Fee, MD;  Location: [facility] ENDOSCOPY;  Service: Gastroenterology;;   HOT HEMOSTASIS N/A 05/29/2021   Procedure: HOT HEMOSTASIS (ARGON PLASMA COAGULATION/BICAP);  Surgeon:  Rachael Fee, MD;  Location: [facility] ENDOSCOPY;  Service: Gastroenterology;  Laterality: N/A;   HOT HEMOSTASIS N/A 06/28/2021   Procedure: HOT HEMOSTASIS (ARGON PLASMA COAGULATION/BICAP);  Surgeon: Beverley Fiedler, MD;  Location: [facility] ENDOSCOPY;  Service: Gastroenterology;  Laterality: N/A;   HOT HEMOSTASIS N/A 08/10/2021   Procedure: HOT HEMOSTASIS (ARGON PLASMA COAGULATION/BICAP);  Surgeon: Lynann Bologna, MD;  Location: [facility] ENDOSCOPY;  Service: Gastroenterology;  Laterality: N/A;   HOT HEMOSTASIS N/A 08/02/2021   Procedure: HOT HEMOSTASIS (ARGON PLASMA COAGULATION/BICAP);  Surgeon: Shellia Cleverly, DO;  Location: [facility] ENDOSCOPY;  Service: Gastroenterology;  Laterality: N/A;   IR PARACENTESIS  07/04/2022   IR PERC TUN PERIT CATH WO PORT S&I /IMAG  05/23/2021   IR REMOVAL TUN CV CATH W/O FL  06/10/2021   IR US GUIDE VASC ACCESS RIGHT  05/23/2021   LAPAROSCOPY N/A 05/24/2021   Procedure: LAPAROSCOPY DIAGNOSTIC;  Surgeon: Emelia Loron, MD;  Location: [facility] OR;  Service: General;  Laterality: N/A;   LAPAROTOMY N/A 05/24/2021   Procedure: EXPLORATORY LAPAROTOMY;  Surgeon: Emelia Loron, MD;  Location: [facility] OR;  Service: General;  Laterality: N/A;   LEFT AND RIGHT HEART CATHETERIZATION WITH CORONARY ANGIOGRAM N/A 06/11/2011   Procedure: LEFT AND RIGHT HEART CATHETERIZATION WITH CORONARY ANGIOGRAM;  Surgeon: Laurey Morale, MD;  Location: [facility] CATH LAB;  Service: Cardiovascular;  Laterality: N/A;   PERIPHERAL VASCULAR BALLOON ANGIOPLASTY Right 07/29/2020   Procedure: PERIPHERAL VASCULAR BALLOON ANGIOPLASTY;  Surgeon: Chuck Hint, MD;  Location: [facility] INVASIVE CV LAB;  Service: Cardiovascular;  Laterality: Right;  arm fistula   REMOVAL OF STONES  05/17/2022   Procedure: REMOVAL OF STONES;  Surgeon: Meridee Score Netty Starring., MD;  Location: [facility] ENDOSCOPY;  Service: Gastroenterology;;   REVISON OF ARTERIOVENOUS FISTULA Right 08/08/2020   Procedure: ANEURYSM EXCISION OF RIGHT UPPER EXTREMITY  ARTERIOVENOUS FISTULA;  Surgeon: Chuck Hint, MD;  Location: [facility] OR;  Service: Vascular;  Laterality: Right;   REVISON OF ARTERIOVENOUS FISTULA Right 03/21/2022   Procedure: EXCISION OF ULCERATED SKIN OF RIGHT ARTERIOVENOUS FISTULA REVISION;  Surgeon: Cephus Shelling, MD;  Location: [facility] OR;  Service: Vascular;  Laterality: Right;   REVISON OF ARTERIOVENOUS FISTULA Right 07/02/2022   Procedure: EXCISION OF ULCERS RIGHT ARTERIOVENOUS FISTULA;  Surgeon: Chuck Hint, MD;  Location: [facility] OR;  Service: Vascular;  Laterality: Right;   SCLEROTHERAPY  05/29/2021   Procedure: Susa Day;  Surgeon: Rachael Fee, MD;  Location: [facility] ENDOSCOPY;  Service: Gastroenterology;;   Dennison Mascot  04/04/2022   Procedure: SPHINCTEROTOMY;  Surgeon: Meryl Dare, MD;  Location: [facility] ENDOSCOPY;  Service: Gastroenterology;;   Francine Graven REMOVAL  05/29/2021   Procedure: STENT REMOVAL;  Surgeon: Rachael Fee, MD;  Location: [facility] ENDOSCOPY;  Service: Gastroenterology;;   Francine Graven REMOVAL  05/17/2022   Procedure: STENT REMOVAL;  Surgeon: Lemar Lofty., MD;  Location: [facility] ENDOSCOPY;  Service: Gastroenterology;;   TEE WITHOUT CARDIOVERSION N/A 12/27/2015   Procedure: TRANSESOPHAGEAL ECHOCARDIOGRAM (TEE);  Surgeon: Quintella Reichert, MD;  Location: [facility] ENDOSCOPY;  Service: Cardiovascular;  Laterality: N/A;   UPPER ESOPHAGEAL ENDOSCOPIC ULTRASOUND (EUS) N/A 04/02/2022   Procedure: UPPER ESOPHAGEAL ENDOSCOPIC ULTRASOUND (EUS);  Surgeon: Lemar Lofty., MD;  Location: [facility] ENDOSCOPY;  Service: Gastroenterology;  Laterality: N/A;    Family History  Problem Relation Age of Onset   Alcohol abuse Mother    Liver disease Mother    Cirrhosis Mother  Heart attack Mother     Social History   Socioeconomic History   Marital status: Divorced    Spouse name: Not on file   Number of children: Not on file   Years of education: Not on file   Highest education level: Not on file  Occupational  History   Occupation: disabled  Tobacco Use   Smoking status: Former    Packs/day: 0.50    Years: 41.00    Additional pack years: 0.00    Total pack years: 20.50    Types: Cigarettes    Quit date: 12/08/2015    Years since quitting: 6.9   Smokeless tobacco: Never  Vaping Use   Vaping Use: Never used  Substance and Sexual Activity   Alcohol use: Not Currently    Alcohol/week: 3.0 standard drinks of alcohol    Types: 1 Glasses of wine, 2 Cans of beer per week    Comment: none since starting dialysis 06/2021   Drug use: No   Sexual activity: Not Currently  Other Topics Concern   Not on file  Social History Narrative   Not on file   Social Determinants of Health   Financial Resource Strain: Low Risk  (09/27/2022)   Overall Financial Resource Strain (CARDIA)    Difficulty of Paying Living Expenses: Not hard at all  Food Insecurity: No Food Insecurity (11/28/2022)   Hunger Vital Sign    Worried About Running Out of Food in the Last Year: Never true    Ran Out of Food in the Last Year: Never true  Transportation Needs: No Transportation Needs (11/28/2022)   PRAPARE - Administrator, Civil Service (Medical): No    Lack of Transportation (Non-Medical): No  Physical Activity: Inactive (09/27/2022)   Exercise Vital Sign    Days of Exercise per Week: 0 days    Minutes of Exercise per Session: 0 min  Stress: No Stress Concern Present (09/27/2022)   [employer] of Occupational Health - Occupational Stress Questionnaire    Feeling of Stress : Not at all  Social Connections: Socially Isolated (09/27/2022)   Social Connection and Isolation Panel [NHANES]    Frequency of Communication with Friends and Family: More than three times a week    Frequency of Social Gatherings with Friends and Family: More than three times a week    Attends Religious Services: Never    Database administrator or Organizations: No    Attends Banker Meetings: Never    Marital Status:  Divorced  Catering manager Violence: Not At Risk (11/20/2022)   Humiliation, Afraid, Rape, and Kick questionnaire    Fear of Current or Ex-Partner: No    Emotionally Abused: No    Physically Abused: No    Sexually Abused: No    Outpatient Medications Prior to Visit  Medication Sig Dispense Refill   acetaminophen (TYLENOL) 500 MG tablet Take 1,500 mg by mouth 3 (three) times daily as needed for mild pain, moderate pain, fever or headache.     allopurinol (ZYLOPRIM) 100 MG tablet Take 1 tablet (100 mg total) by mouth daily. 90 tablet 1   AURYXIA 1 GM 210 MG(Fe) tablet Take 210-420 mg by mouth See admin instructions. 420 mg with every meal, 210 mg with each snack.     cinacalcet (SENSIPAR) 30 MG tablet Take 1 tablet (30 mg total) by mouth Every Tuesday,Thursday,and Saturday with dialysis. 60 tablet 0   diphenhydramine-acetaminophen (TYLENOL PM) 25-500 MG TABS tablet Take 2 tablets  by mouth at bedtime as needed (sleep/pain). 14 tablet 3   folic acid (FOLVITE) 1 MG tablet TAKE 1 TABLET BY MOUTH EVERY DAY 30 tablet 1   hydroxyurea (HYDREA) 500 MG capsule Take 1 capsule (500 mg total) by mouth daily. 90 capsule 1   Iron-Vitamins (GERITOL PO) Take 15 mLs by mouth daily.     lactulose (CHRONULAC) 10 GM/15ML solution Take 30 mLs (20 g total) by mouth 2 (two) times daily as needed for mild constipation. 237 mL 0   lidocaine (LMX) 4 % cream Apply topically daily.     lidocaine-prilocaine (EMLA) cream Apply 1 application. topically Every Tuesday,Thursday,and Saturday with dialysis.     oxyCODONE (OXYCONTIN) 10 mg 12 hr tablet Take 1 tablet (10 mg total) by mouth every 12 (twelve) hours. 30 tablet 0   Oxycodone HCl 10 MG TABS Take 1 tablet (10 mg total) by mouth every 6 (six) hours as needed. 60 tablet 0   pantoprazole (PROTONIX) 40 MG tablet Take 1 tablet (40 mg total) by mouth daily. Use this prescription after done with twice daily dosing x 2 months 90 tablet 2   tiZANidine (ZANAFLEX) 4 MG tablet TAKE  1/2 TABLET (2MG  TOTAL) BY MOUTH EVERY 8 HOURS AS NEEDED FOR MUSCLE SPASM 30 tablet 2   Torsemide 40 MG TABS Take 40 mg by mouth in the morning and at bedtime. 60 tablet 2   pantoprazole (PROTONIX) 40 MG tablet Take 1 tablet (40 mg total) by mouth 2 (two) times daily. X 2 months, then decrease to 1 tablet by mouth once daily therafter (Patient not taking: Reported on 12/04/2022) 60 tablet 1   No facility-administered medications prior to visit.    No Known Allergies  ROS Review of Systems  Constitutional:  Negative for activity change, appetite change, chills, diaphoresis and fever.  HENT:  Negative for congestion, dental problem, drooling and ear discharge.   Eyes:  Positive for visual disturbance.  Respiratory: Negative.  Negative for apnea, cough, choking, chest tightness and wheezing.   Cardiovascular: Negative.  Negative for chest pain, palpitations and leg swelling.  Gastrointestinal: Negative.  Negative for abdominal distention, abdominal pain, nausea and vomiting.  Musculoskeletal:  Positive for arthralgias.  Skin:  Negative for color change and pallor.  Neurological:  Negative for dizziness, facial asymmetry, light-headedness and headaches.  Psychiatric/Behavioral:  Negative for agitation, behavioral problems, confusion and decreased concentration.       Objective:    Physical Exam Constitutional:      General: He is not in acute distress.    Appearance: He is not diaphoretic.  Eyes:     General: No scleral icterus. Cardiovascular:     Rate and Rhythm: Rhythm irregular.     Heart sounds: No murmur heard.    No friction rub.  Pulmonary:     Effort: Pulmonary effort is normal. No respiratory distress.     Breath sounds: Normal breath sounds. No stridor. No wheezing, rhonchi or rales.  Abdominal:     General: There is no distension.     Palpations: Abdomen is soft.     Tenderness: There is no abdominal tenderness. There is no guarding.  Musculoskeletal:        General:  Tenderness present.     Right lower leg: No edema.     Left lower leg: No edema.     Comments: Left knee tenderness on ROM ( chronic pain syndrome)  Skin:    Capillary Refill: Capillary refill takes 2 to 3 seconds.  Findings: No bruising, erythema, lesion or rash.  Neurological:     Mental Status: He is alert and oriented to person, place, and time.     Motor: No weakness.     Gait: Gait abnormal.  Psychiatric:        Mood and Affect: Mood normal.        Behavior: Behavior normal.        Thought Content: Thought content normal.        Judgment: Judgment normal.     BP (!) 96/55   Pulse 64   Temp (!) 97.1 F (36.2 C)   Ht 5\' 11"  (1.803 m)   Wt 141 lb (64 kg)   SpO2 100%   BMI 19.67 kg/m  Wt Readings from Last 3 Encounters:  12/04/22 141 lb (64 kg)  11/24/22 142 lb 13.7 oz (64.8 kg)  10/30/22 139 lb (63 kg)    Lab Results  Component Value Date   TSH 7.919 (H) 07/24/2022   Lab Results  Component Value Date   WBC 4.6 11/27/2022   HGB 8.3 (L) 11/27/2022   HCT 23.5 (L) 11/27/2022   MCV 100.4 (H) 11/27/2022   PLT 122 (L) 11/27/2022   Lab Results  Component Value Date   NA 126 (L) 11/27/2022   K 4.5 11/27/2022   CO2 24 11/27/2022   GLUCOSE 84 11/27/2022   BUN 68 (H) 11/27/2022   CREATININE 8.61 (H) 11/27/2022   BILITOT 1.1 11/26/2022   ALKPHOS 187 (H) 11/26/2022   AST 20 11/26/2022   ALT 20 11/26/2022   PROT 6.9 11/26/2022   ALBUMIN 2.9 (L) 11/27/2022   CALCIUM 8.6 (L) 11/27/2022   ANIONGAP 12 11/27/2022   EGFR 8 (L) 08/01/2022   GFR 46.63 (L) 01/08/2012   Lab Results  Component Value Date   CHOL 114 (L) 02/17/2015   Lab Results  Component Value Date   HDL 31 (L) 02/17/2015   Lab Results  Component Value Date   LDLCALC 68 02/17/2015   Lab Results  Component Value Date   TRIG 42 06/05/2021   Lab Results  Component Value Date   CHOLHDL 3.7 02/17/2015   Lab Results  Component Value Date   HGBA1C 4.9 05/25/2021      Assessment & Plan:    Problem List Items Addressed This Visit       Genitourinary   ESRD on hemodialysis (HCC)    Continue hemodialysis 3  days weekly Had dialysis today        Other   Chronic pain syndrome    Continue OxyContin 10 mg twice daily, oxycodone 10 mg every 6 hours as needed       Sickle cell pain crisis (HCC) - Primary    Patient reports doing much better today Continue folic acid 1 mg daily and hydroxyurea 500 mg daily Continue OxyContin 10 mg twice daily, oxycodone 10 mg every 6 hours as needed for chronic pain Importance of hydration and avoiding triggers of infection discussed Follow-up with PCP in 2 months       Relevant Orders   CBC with Differential/Platelet   Hospital discharge follow-up    Hospital discharge summary labs and recommendations reviewed by me today Patient reports feeling much better Continue current medications Check his visit with diff      Relevant Orders   CBC with Differential/Platelet    No orders of the defined types were placed in this encounter.   Follow-up: Return in about 2 months (around 02/03/2023) for  scd.    Donell Beers, FNP

## 2022-12-04 NOTE — Assessment & Plan Note (Signed)
Continue hemodialysis 3  days weekly Had dialysis today

## 2022-12-04 NOTE — Patient Instructions (Signed)
Living With Sickle Cell Disease Living with a long-term condition, such as sickle cell disease, can be a challenge. It can affect both your physical and mental health. You may not have total control over your condition. But proper care and treatment can help manage the effects of the disease so you can feel good and lead an active life. You can take steps to manage your condition and stay as healthy as possible. How does sickle cell disease affect me? Sickle cell disease can cause challenges that affect your quality of life. You may get sick more often as a result of organ damage and infections. Sometimes you may need to stay in the hospital. Learn how to recognize that you are not feeling well and that you may be getting sick. What actions can I take to manage my condition?  The goals of treatment are to control your symptoms and prevent and treat problems. Work with your health care provider to create a treatment plan that works for you. Taking an active role in managing your condition can help you feel more in control of your situation. Ask about possible side effects of medicines that your health care provider recommends. Discuss how you feel about having those side effects. Keeping a healthy lifestyle can help you manage your condition. This includes eating a healthy diet, getting enough sleep, and getting regular exercise. Sickle cell disease may affect your ability to take care of your basic needs. Tell your health care provider if you have concerns about any of these needs: Access to food. Housing. Safe drinking water and other utilities. Safety in your home and community. Work or school. Transportation. Paying for health care. Your health care provider may be able to connect you with community resources that can help you. How to manage stress  Living with sickle cell disease can be stressful. This disease can have a big impact on your mental health. Talk with your health care provider  about ways to reduce your stress or if you have concerns about your mental health.  To cope with stress, try: Keeping a stress diary. This can help you learn what causes your stress to start (figure out your triggers) and how to control your response to those triggers. Spending time doing things that you enjoy, such as: Hobbies. Being outdoors. Spending time with friends and people who make you laugh. Doing yoga, muscle relaxation, deep breathing, or mindfulness practices. Expressing yourself through journal writing, art, crafting, poetry, or playing music. Staying positive about your health. Try to accept that you cannot control your condition perfectly. Follow these instructions at home: Medicines Take over-the-counter and prescription medicines only as told by your health care provider. If you were prescribed antibiotics, take them as told by your health care provider. Do not stop taking them even if you start to feel better. If you develop a fever, do not take medicines to reduce the fever right away. This could cover up another problem. Contact your health care provider. Eating and drinking Drink enough fluid to keep your urine pale yellow. Drink more in hot weather and during exercise. Limit or avoid drinking alcohol. Eat a balanced and nutritious diet. Eat plenty of fruits, vegetables, whole grains, and lean protein. Take vitamins and supplements as told by your health care provider. Traveling When traveling, keep these with you: Your medical information. The names of your health care providers. Your medicines. If you have to travel by air, ask about precautions you should take. Managing pain Work with   your health care provider to create a pain management plan that works for you. The plan may include: Ways to reduce or manage your pain at home, such as: Using a heating pad. Taking a warm bath. Using healthy ways to distract you from the pain, such as hobbies or  reading. Practicing ways to relax, such as doing yoga or listening to music. Getting massages. Doing exercises or stretches as told by a physical therapist. Tracking how pain affects your daily life functions. When to seek help. Who to contact and what to do in case of a pain emergency. General instructions Do not use any products that contain nicotine or tobacco. These products include cigarettes, chewing tobacco, and vaping devices, such as e-cigarettes. These lower blood oxygen levels. If you need help quitting, ask your health care provider. Consider wearing a medical alert bracelet. Use an app or journal to track your symptoms, assess your level of pain and fatigue, and keep track of your medicines. Avoid the following: High altitudes. Very high or low temperatures and big changes in temperature. Activities that will lower your oxygen levels, such as mountain climbing or doing exercise that takes a lot of effort. Stay up to date on: Your treatment plan. Learn as much as you can about your condition. Health screenings. This will help prevent problems or catch them early on. Vaccines. This will help prevent infection. Wash your hands often with soap and water to help prevent infections. Wash them for at least 20 seconds each time. Keep all follow-up visits. Regular follow-up with your health care provider can help you better manage your condition. Where to find support You can find help and support through: Talking with a therapist or taking part in support groups. Sickle Cell [employer] [place]: www.sicklecelldisease.org Where to find more information Centers for Disease Control and Prevention: www.cdc.gov American Society of Hematology: www.hematology.org Contact a health care provider if: Your symptoms get worse. You have new symptoms. You have a fever. Get help right away if: You have a painful erection of the penis that lasts a long time (priapism). You become  short of breath or are having trouble breathing. You have pain that cannot be controlled with medicine. You have any signs of a stroke. "BE FAST" is an easy way to remember the main warning signs: B - Balance. Dizziness, sudden trouble walking, or loss of balance. E - Eyes. Trouble seeing or a change in how you see. F - Face. Sudden weakness or loss of feeling of the face. The face or eyelid may droop on one side. A - Arms. Weakness or loss of feeling in an arm. This happens all of a sudden and most often on one side of the body. S - Speech. Sudden trouble speaking, slurred speech, or trouble understanding what people say. T - Time. Time to call emergency services. Write down what time symptoms started. You have other signs of a stroke, such as: A sudden, very bad headache with no known cause. Feeling like you may vomit (nausea). Vomiting. Seizure. These symptoms may be an emergency. Get help right away. Call 911. Do not wait to see if the symptoms will go away. Do not drive yourself to the hospital. Also, get help right away if: You have strong feelings of sadness or loss of hope, or you have thoughts about hurting yourself or others. Take one of these steps if you feel like you may hurt yourself or others, or have thoughts about taking your own life:   Go to your nearest emergency room. Call 911. Call the National Suicide Prevention Lifeline at 1-800-273-8255 or 988. This is open 24 hours a day. Text the Crisis Text Line at 741741. Summary Proper care and treatment can help manage the effects of sickle cell disease so you can feel good and lead an active life. The goals of treatment are to control your symptoms and prevent and treat problems. Taking an active role in managing your condition can help you feel more in control of your situation. Work with your health care provider to create a pain management plan that works for you. Get medical help right away as told by your health care  provider. This information is not intended to replace advice given to you by your health care provider. Make sure you discuss any questions you have with your health care provider. Document Revised: 09/11/2021 Document Reviewed: 09/11/2021 [employer] Patient Education  2024 [employer]  

## 2022-12-04 NOTE — Assessment & Plan Note (Signed)
Continue OxyContin 10 mg twice daily, oxycodone 10 mg every 6 hours as needed

## 2022-12-04 NOTE — Assessment & Plan Note (Signed)
Hospital discharge summary labs and recommendations reviewed by me today Patient reports feeling much better Continue current medications Check his visit with diff

## 2022-12-04 NOTE — Telephone Encounter (Signed)
Pt wanted to know about his eye referral status? Please call pt

## 2022-12-05 LAB — CBC WITH DIFFERENTIAL/PLATELET
Basophils Absolute: 0.1 10*3/uL (ref 0.0–0.2)
Basos: 2 %
EOS (ABSOLUTE): 0.2 10*3/uL (ref 0.0–0.4)
Eos: 4 %
Hematocrit: 25.1 % — ABNORMAL LOW (ref 37.5–51.0)
Hemoglobin: 8.9 g/dL — ABNORMAL LOW (ref 13.0–17.7)
Immature Grans (Abs): 0 10*3/uL (ref 0.0–0.1)
Immature Granulocytes: 1 %
Lymphocytes Absolute: 0.5 10*3/uL — ABNORMAL LOW (ref 0.7–3.1)
Lymphs: 13 %
MCH: 34.5 pg — ABNORMAL HIGH (ref 26.6–33.0)
MCHC: 35.5 g/dL (ref 31.5–35.7)
MCV: 97 fL (ref 79–97)
Monocytes Absolute: 0.6 10*3/uL (ref 0.1–0.9)
Monocytes: 17 %
Neutrophils Absolute: 2.3 10*3/uL (ref 1.4–7.0)
Neutrophils: 63 %
Platelets: 128 10*3/uL — ABNORMAL LOW (ref 150–450)
RBC: 2.58 x10E6/uL — CL (ref 4.14–5.80)
RDW: 20.2 % — ABNORMAL HIGH (ref 11.6–15.4)
WBC: 3.6 10*3/uL (ref 3.4–10.8)

## 2022-12-07 ENCOUNTER — Encounter: Payer: Self-pay | Admitting: *Deleted

## 2022-12-11 DIAGNOSIS — D571 Sickle-cell disease without crisis: Secondary | ICD-10-CM | POA: Diagnosis not present

## 2022-12-11 DIAGNOSIS — D631 Anemia in chronic kidney disease: Secondary | ICD-10-CM | POA: Diagnosis not present

## 2022-12-11 DIAGNOSIS — L299 Pruritus, unspecified: Secondary | ICD-10-CM | POA: Diagnosis not present

## 2022-12-11 DIAGNOSIS — D57819 Other sickle-cell disorders with crisis, unspecified: Secondary | ICD-10-CM | POA: Diagnosis not present

## 2022-12-11 DIAGNOSIS — N2581 Secondary hyperparathyroidism of renal origin: Secondary | ICD-10-CM | POA: Diagnosis not present

## 2022-12-11 DIAGNOSIS — Z992 Dependence on renal dialysis: Secondary | ICD-10-CM | POA: Diagnosis not present

## 2022-12-11 DIAGNOSIS — D57219 Sickle-cell/Hb-C disease with crisis, unspecified: Secondary | ICD-10-CM | POA: Diagnosis not present

## 2022-12-11 DIAGNOSIS — N186 End stage renal disease: Secondary | ICD-10-CM | POA: Diagnosis not present

## 2022-12-11 DIAGNOSIS — D5700 Hb-SS disease with crisis, unspecified: Secondary | ICD-10-CM | POA: Diagnosis not present

## 2022-12-14 NOTE — Progress Notes (Signed)
  Care Coordination Note  12/14/2022 Name: ARMAHNI MUCKELROY MRN: [id number] DOB: July 01, 1955  Cuyler D Shuba is a 67 y.o. year old male who is a primary care patient of Quentin Angst, MD and is actively engaged with the care management team. I reached out to Ree Shay by phone today to assist with re-scheduling a follow up visit with the RN Case Manager  Follow up plan: Telephone appointment with care management team member scheduled for: 01/04/2023  Burman Nieves, [facility] Care Coordination Care Guide Direct Dial: [phone]

## 2022-12-16 DIAGNOSIS — Z992 Dependence on renal dialysis: Secondary | ICD-10-CM | POA: Diagnosis not present

## 2022-12-16 DIAGNOSIS — I158 Other secondary hypertension: Secondary | ICD-10-CM | POA: Diagnosis not present

## 2022-12-16 DIAGNOSIS — N186 End stage renal disease: Secondary | ICD-10-CM | POA: Diagnosis not present

## 2022-12-17 ENCOUNTER — Telehealth: Payer: Self-pay | Admitting: Internal Medicine

## 2022-12-17 NOTE — Progress Notes (Signed)
ENOS, AIKINS [name] ([id number]) [id number] Page 1 of 6 Visit Report for 11/08/2022 Chief Complaint Document Details Patient Name: Date of Service: Patrick [name] [facility] Patrick [name]. 11/08/2022 2:30 PM Medical Record Number: [id number] Patient Account Number: [id number] Date of Birth/Sex: Treating RN: 02/28/1956 (67 y.o. M) Primary Care Provider: Jeanann Lewandowsky Other Clinician: Referring Provider: Treating Provider/Extender: Glade Nurse Weeks in Treatment: 3 Information Obtained from: Patient Chief Complaint 10/12/2022; left lower extremity wound Electronic Signature(s) Signed: 11/08/2022 4:23:19 PM By: [name] DO Entered By: [name] on 11/08/2022 15:44:44 -------------------------------------------------------------------------------- HPI Details Patient Name: Date of Service: [facility] Patrick [name]. 11/08/2022 2:30 PM Medical Record Number: [id number] Patient Account Number: [id number] Date of Birth/Sex: Treating RN: 11-05-1955 (67 y.o. M) Primary Care Provider: Jeanann Lewandowsky Other Clinician: Referring Provider: Treating Provider/Extender: Glade Nurse Weeks in Treatment: 3 History of Present Illness HPI Description: 10/12/2022 [name] is a 67 year old male with a past medical history of end-stage renal disease on hemodialysis, sickle cell disease, blindness, atrial fibrillation on blood thinner and chronic systolic congestive heart failure that presents to the clinic for a 2 to 42-month history of nonhealing ulcer to the left lateral foot. He is not sure how it started. He recently saw his primary care physician who started him on doxycycline. He has been keeping the area covered. He currently denies signs of infection. 5/3; patient presents for follow-up. We have been using antibiotic ointment with Hydrofera Blue under Kerlix/Coban to the left lower extremity. The wound  is smaller. He tolerated the wrap well. He has no issues or complaints today. 5/9; patient presents for follow-up. We have been using antibiotic ointment with Hydrofera Blue under Kerlix/Coban to the left lower extremity. The wound is smaller. He has no issues or complaints today. 5/16; patient presents for follow-up. We have been using antibiotic ointment with Hydrofera Blue under Kerlix/Coban to the left lower extremity. Wound is smaller. 5/23; patient presents for follow-up. We have been using Hydrofera Blue under Kerlix/Coban. His wound is healed. We gave him information to order compression stockings. Electronic Signature(s) Signed: 11/08/2022 4:23:19 PM By: [name] DO Entered By: [name] on 11/08/2022 15:45:36 Patrick [name], Patrick [name] ([id number]) [id number] Page 2 of 6 -------------------------------------------------------------------------------- Physical Exam Details Patient Name: Date of Service: Patrick [name] [facility] Patrick [name]. 11/08/2022 2:30 PM Medical Record Number: [id number] Patient Account Number: [id number] Date of Birth/Sex: Treating RN: 12/09/1955 (67 y.o. M) Primary Care Provider: Jeanann Lewandowsky Other Clinician: Referring Provider: Treating Provider/Extender: [name], Olugbemiga Weeks in Treatment: 3 Constitutional respirations regular, non-labored and within target range for patient.. Cardiovascular 2+ dorsalis pedis/posterior tibialis pulses. Psychiatric pleasant and cooperative. Notes T the left lateral foot there is epithelization to the previous wound site. Good edema control. o Electronic Signature(s) Signed: 11/08/2022 4:23:19 PM By: [name] DO Entered By: [name] on 11/08/2022 15:46:24 -------------------------------------------------------------------------------- Physician Orders Details Patient Name: Date of Service: [name] Patrick [name]. 11/08/2022 2:30 PM Medical Record  Number: [id number] Patient Account Number: [id number] Date of Birth/Sex: Treating RN: 05/23/1956 (67 y.o. M) Primary Care Provider: Jeanann Lewandowsky Other Clinician: Referring Provider: Treating Provider/Extender: Glade Nurse Weeks in Treatment: 3 Verbal / Phone Orders: No Diagnosis Coding Discharge From [facility] Services Discharge from Wound Care Center Bathing/ Shower/ Hygiene May shower and wash wound with soap and water. Edema Control - Lymphedema / SCD / Other Elevate legs to the level of the heart or above for 30 minutes daily and/or when  sitting for 3-4 times a day throughout the day. Avoid standing for long periods of time. Compression stocking or Garment 20-30 mm/Hg pressure to: Electronic Signature(s) Signed: 11/08/2022 4:23:19 PM By: [name] DO Entered By: [name] on 11/08/2022 15:46:31 Patrick [name], Patrick [name] ([id number]) [id number] Page 3 of 6 -------------------------------------------------------------------------------- Problem List Details Patient Name: Date of Service: Patrick [name] [facility] Patrick [name]. 11/08/2022 2:30 PM Medical Record Number: [id number] Patient Account Number: [id number] Date of Birth/Sex: Treating RN: 12/23/1955 (67 y.o. M) Primary Care Provider: Jeanann Lewandowsky Other Clinician: Referring Provider: Treating Provider/Extender: Glade Nurse Weeks in Treatment: 3 Active Problems ICD-10 Encounter Code Description Active Date MDM Diagnosis [phone] Non-pressure chronic ulcer of other part of left lower leg with fat layer exposed4/26/2024 No Yes I87.312 Chronic venous hypertension (idiopathic) with ulcer of left lower extremity 10/12/2022 No Yes N18.6 End stage renal disease 10/12/2022 No Yes D57.1 Sickle-cell disease without crisis 10/12/2022 No Yes J44.9 Chronic obstructive pulmonary disease, unspecified 10/12/2022 No Yes H54.8 Legal blindness, as defined in [place] 10/12/2022  No Yes Inactive Problems Resolved Problems Electronic Signature(s) Signed: 11/08/2022 4:23:19 PM By: [name] DO Entered By: [name] on 11/08/2022 15:44:32 -------------------------------------------------------------------------------- Progress Note Details Patient Name: Date of Service: [facility] Patrick [name]. 11/08/2022 2:30 PM Medical Record Number: [id number] Patient Account Number: [id number] Date of Birth/Sex: Treating RN: 1955-11-21 (67 y.o. M) Primary Care Provider: Jeanann Lewandowsky Other Clinician: Referring Provider: Treating Provider/Extender: Glade Nurse Weeks in Treatment: 3 Subjective Chief Complaint Information obtained from Patient 10/12/2022; left lower extremity wound History of Present Illness (HPI) 10/12/2022 [name] is a 67 year old male with a past medical history of end-stage renal disease on hemodialysis, sickle cell disease, blindness, atrial fibrillation on blood thinner and chronic systolic congestive heart failure that presents to the clinic for a 2 to 71-month history of nonhealing ulcer to the left SUFIAN, JACHIM [name] ([id number]) [id number] Page 4 of 6 lateral foot. He is not sure how it started. He recently saw his primary care physician who started him on doxycycline. He has been keeping the area covered. He currently denies signs of infection. 5/3; patient presents for follow-up. We have been using antibiotic ointment with Hydrofera Blue under Kerlix/Coban to the left lower extremity. The wound is smaller. He tolerated the wrap well. He has no issues or complaints today. 5/9; patient presents for follow-up. We have been using antibiotic ointment with Hydrofera Blue under Kerlix/Coban to the left lower extremity. The wound is smaller. He has no issues or complaints today. 5/16; patient presents for follow-up. We have been using antibiotic ointment with Hydrofera Blue under  Kerlix/Coban to the left lower extremity. Wound is smaller. 5/23; patient presents for follow-up. We have been using Hydrofera Blue under Kerlix/Coban. His wound is healed. We gave him information to order compression stockings. Patient History Information obtained from Patient, Chart. Family History Heart Disease - Mother, No family history of Cancer, Diabetes, Hereditary Spherocytosis, Hypertension, Kidney Disease, Lung Disease, Seizures, Stroke, Thyroid Problems, Tuberculosis. Social History Former smoker, Alcohol Use - Never, Drug Use - No History, Caffeine Use - Rarely. Medical History Hematologic/Lymphatic Patient has history of Sickle Cell Disease Cardiovascular Patient has history of Arrhythmia - A fib, Congestive Heart Failure Denies history of Hypertension Musculoskeletal Patient has history of Gout Medical A Surgical History Notes nd Cardiovascular mitral regurgitation; tricuspid regurgitation!!! Objective Constitutional respirations regular, non-labored and within target range for patient.. Vitals Time Taken: 3:22 PM, Temperature: 98.1 F, Pulse: 111 bpm, Respiratory  Rate: 16 breaths/min, Blood Pressure: 104/56 mmHg. Cardiovascular 2+ dorsalis pedis/posterior tibialis pulses. Psychiatric pleasant and cooperative. General Notes: T the left lateral foot there is epithelization to the previous wound site. Good edema control. o Integumentary (Hair, Skin) Wound #1 status is Healed - Epithelialized. Original cause of wound was Gradually Appeared. The date acquired was: 07/20/2022. The wound has been in treatment 3 weeks. The wound is located on the Left,Lateral Foot. The wound measures 0cm length x 0cm width x 0cm depth; 0cm^2 area and 0cm^3 volume. There is no tunneling or undermining noted. There is a none present amount of drainage noted. The wound margin is distinct with the outline attached to the wound base. There is no granulation within the wound bed. There is a  large (67-100%) amount of necrotic tissue within the wound bed including Adherent Slough. The periwound skin appearance exhibited: Dry/Scaly. The periwound skin appearance did not exhibit: Callus, Crepitus, Excoriation, Induration, Rash, Scarring, Maceration, Atrophie Blanche, Cyanosis, Ecchymosis, Hemosiderin Staining, Mottled, Pallor, Rubor, Erythema. Periwound temperature was noted as No Abnormality. The periwound has tenderness on palpation. Assessment Active Problems ICD-10 Non-pressure chronic ulcer of other part of left lower leg with fat layer exposed Chronic venous hypertension (idiopathic) with ulcer of left lower extremity End stage renal disease Sickle-cell disease without crisis Chronic obstructive pulmonary disease, unspecified Legal blindness, as defined in [place] Patrick [name], Patrick [name] ([id number]) [id number] Page 5 of 6 Patient has done well with Hydrofera Blue under compression therapy. His wound has healed. I recommended compression stockings daily. We gave him information to order this today. I recommended Tubigrip daily until he obtains these. We gave this to him in office. He asked for a note to return back to work with limited standing. He knows to call with any questions or concerns. Plan Discharge From [facility] Services: Discharge from Wound Care Center Bathing/ Shower/ Hygiene: May shower and wash wound with soap and water. Edema Control - Lymphedema / SCD / Other: Elevate legs to the level of the heart or above for 30 minutes daily and/or when sitting for 3-4 times a day throughout the day. Avoid standing for long periods of time. Compression stocking or Garment 20-30 mm/Hg pressure to: 1. Discharge from clinic due to closed wound 2. Follow-up as needed 3. Compression stockings daily 4. Work note given Psychologist, prison and probation services) Signed: 12/17/2022 2:04:38 PM By: Pearletha Alfred Signed: 12/17/2022 2:57:08 PM By: [name] DO Previous  Signature: 11/08/2022 4:23:19 PM Version By: [name] DO Entered By: Pearletha Alfred on 12/17/2022 14:04:38 -------------------------------------------------------------------------------- HxROS Details Patient Name: Date of Service: [facility] Patrick [name]. 11/08/2022 2:30 PM Medical Record Number: [id number] Patient Account Number: [id number] Date of Birth/Sex: Treating RN: 1956/02/22 (67 y.o. M) Primary Care Provider: Jeanann Lewandowsky Other Clinician: Referring Provider: Treating Provider/Extender: Glade Nurse Weeks in Treatment: 3 Information Obtained From Patient Chart Hematologic/Lymphatic Medical History: Positive for: Sickle Cell Disease Cardiovascular Medical History: Positive for: Arrhythmia - A fib; Congestive Heart Failure Negative for: Hypertension Past Medical History Notes: mitral regurgitation; tricuspid regurgitation!!! Musculoskeletal Medical History: Positive for: Gout Immunizations Implantable Devices Yes Patrick [name], Patrick [name] ([id number]) [id number] Page 6 of 6 Family and Social History Cancer: No; Diabetes: No; Heart Disease: Yes - Mother; Hereditary Spherocytosis: No; Hypertension: No; Kidney Disease: No; Lung Disease: No; Seizures: No; Stroke: No; Thyroid Problems: No; Tuberculosis: No; Former smoker; Alcohol Use: Never; Drug Use: No History; Caffeine Use: Rarely; Financial Concerns: No; Food, Clothing or Shelter Needs: No; Support System Lacking: No; Transportation Concerns:  No Electronic Signature(s) Signed: 11/08/2022 4:23:19 PM By: [name] DO Entered By: [name] on 11/08/2022 15:45:43 -------------------------------------------------------------------------------- SuperBill Details Patient Name: Date of Service: [facility] Patrick [name]. 11/08/2022 Medical Record Number: [id number] Patient Account Number: [id number] Date of Birth/Sex: Treating RN: 01-17-1956 (67 y.o.  M) Primary Care Provider: Jeanann Lewandowsky Other Clinician: Referring Provider: Treating Provider/Extender: Glade Nurse Weeks in Treatment: 3 Diagnosis Coding ICD-10 Codes Code Description [phone] Non-pressure chronic ulcer of other part of left lower leg with fat layer exposed I87.312 Chronic venous hypertension (idiopathic) with ulcer of left lower extremity N18.6 End stage renal disease D57.1 Sickle-cell disease without crisis J44.9 Chronic obstructive pulmonary disease, unspecified H54.8 Legal blindness, as defined in [place] Facility Procedures : CPT4 Code: [id number] Description: [phone] - WOUND CARE VISIT-LEV 2 EST PT Modifier: Quantity: 1 Physician Procedures : CPT4 Code Description Modifier [id number] - WC PHYS LEVEL 3 - EST PT ICD-10 Diagnosis Description L97.822 Non-pressure chronic ulcer of other part of left lower leg with fat layer exposed I87.312 Chronic venous hypertension (idiopathic) with ulcer  of left lower extremity N18.6 End stage renal disease D57.1 Sickle-cell disease without crisis Quantity: 1 Electronic Signature(s) Signed: 11/08/2022 4:23:19 PM By: [name] DO Signed: 11/08/2022 5:26:09 PM By: Redmond Pulling RN, BSN Entered By: Redmond Pulling on 11/08/2022 16:07:27

## 2022-12-17 NOTE — Telephone Encounter (Signed)
He left v/m about his EYE referral info

## 2022-12-17 NOTE — Progress Notes (Signed)
EDO, GRZYB D ([id number]) [id number] Page 1 of 7 Visit Report for 10/25/2022 Arrival Information Details Patient Name: Date of Service: [name] NZ[facility] D. 10/25/2022 2:45 PM Medical Record Number: [id number] Patient Account Number: [id number] Date of Birth/Sex: Treating RN: 08-Aug-1955 (67 y.o. M) Primary Care Graden Hoshino: Jeanann Lewandowsky Other Clinician: Referring Cyan Moultrie: Treating Deerica Waszak/Extender: Glade Nurse Weeks in Treatment: 1 Visit Information History Since Last Visit Added or deleted any medications: No Patient Arrived: Other Any new allergies or adverse reactions: No Arrival Time: 14:50 Had a fall or experienced change in No Accompanied By: self activities of daily living that may affect Transfer Assistance: None risk of falls: Patient Identification Verified: Yes Signs or symptoms of abuse/neglect since last visito No Secondary Verification Process Completed: Yes Hospitalized since last visit: Yes Patient Requires Transmission-Based Precautions: No Has Dressing in Place as Prescribed: Yes Patient Has Alerts: Yes Has Compression in Place as Prescribed: Yes Patient Alerts: Legally blind-needs help Pain Present Now: No walking to room Electronic Signature(s) Signed: 12/17/2022 4:30:35 PM By: Thayer Dallas Entered By: Thayer Dallas on 10/25/2022 14:51:01 -------------------------------------------------------------------------------- Encounter Discharge Information Details Patient Name: Date of Service: [name] NZ[facility] D. 10/25/2022 2:45 PM Medical Record Number: [id number] Patient Account Number: [id number] Date of Birth/Sex: Treating RN: 1956-04-21 (67 y.o. M) Primary Care Estell Dillinger: Jeanann Lewandowsky Other Clinician: Referring Kniyah Khun: Treating Katelee Schupp/Extender: Glade Nurse Weeks in Treatment: 1 Encounter Discharge Information Items Post Procedure Vitals Discharge  Condition: Stable Temperature (F): 97.8 Ambulatory Status: Ambulatory Pulse (bpm): 82 Discharge Destination: Home Respiratory Rate (breaths/min): 18 Transportation: Private Auto Blood Pressure (mmHg): 132/84 Accompanied By: self Schedule Follow-up Appointment: Yes Clinical Summary of Care: Patient Declined Electronic Signature(s) Signed: 10/25/2022 3:57:50 PM By: Pearletha Alfred Previous Signature: 10/25/2022 3:57:03 PM Version By: Pearletha Alfred Entered By: Pearletha Alfred on 10/25/2022 15:57:50 Sorto, Evlyn Clines D ([id number]) [id number] Page 2 of 7 -------------------------------------------------------------------------------- Lower Extremity Assessment Details Patient Name: Date of Service: [name] NZO D. 10/25/2022 2:45 PM Medical Record Number: [id number] Patient Account Number: [id number] Date of Birth/Sex: Treating RN: 01-29-1956 (67 y.o. M) Primary Care Delmo Matty: Jeanann Lewandowsky Other Clinician: Referring Theresia Pree: Treating Shron Ozer/Extender: [name], Olugbemiga Weeks in Treatment: 1 Edema Assessment Assessed: [Left: No] [Right: No] Edema: [Left: N] [Right: o] Calf Left: Right: Point of Measurement: 34 cm From Medial Instep 26.5 cm Ankle Left: Right: Point of Measurement: 9 cm From Medial Instep 20 cm Electronic Signature(s) Signed: 12/17/2022 4:30:35 PM By: Thayer Dallas Entered By: Thayer Dallas on 10/25/2022 15:04:28 -------------------------------------------------------------------------------- Multi Wound Chart Details Patient Name: Date of Service: [name] NZO D. 10/25/2022 2:45 PM Medical Record Number: [id number] Patient Account Number: [id number] Date of Birth/Sex: Treating RN: 05/18/1956 (67 y.o. M) Primary Care Diana Davenport: Jeanann Lewandowsky Other Clinician: Referring Maylie Ashton: Treating Eyan Hagood/Extender: [name], Olugbemiga Weeks in Treatment: 1 Vital Signs Height(in): Pulse(bpm):  85 Weight(lbs): Blood Pressure(mmHg): 105/64 Body Mass Index(BMI): Temperature(F): 98.2 Respiratory Rate(breaths/min): 18 [1:Photos:] [N/A:N/A] Left, Lateral Foot N/A N/A Wound Location: Gradually Appeared N/A N/A Wounding Event: Venous Leg Ulcer N/A N/A Primary Etiology: Sickle Cell Disease, Arrhythmia, N/A N/A Comorbid History: Congestive Heart Failure, Gout 07/20/2022 N/A N/A Date AcquiredTIYON, ROORDA ([id number]) [phone].pdf Page 3 of 7 1 N/A N/A Weeks of Treatment: Open N/A N/A Wound Status: No N/A N/A Wound Recurrence: 0.3x1x0.1 N/A N/A Measurements L x W x D (cm) 0.236 N/A N/A A (cm) : rea 0.024 N/A N/A Volume (cm) : 66.60% N/A N/A % Reduction in  Area: 66.20% N/A N/A % Reduction in Volume: Full Thickness Without Exposed N/A N/A Classification: Support Structures Medium N/A N/A Exudate A mount: Serosanguineous N/A N/A Exudate Type: red, brown N/A N/A Exudate Color: Distinct, outline attached N/A N/A Wound Margin: Small (1-33%) N/A N/A Granulation A mount: Red, Pink N/A N/A Granulation Quality: Large (67-100%) N/A N/A Necrotic A mount: Eschar, Adherent Slough N/A N/A Necrotic Tissue: Fascia: No N/A N/A Exposed Structures: Fat Layer (Subcutaneous Tissue): No Tendon: No Muscle: No Joint: No Bone: No Small (1-33%) N/A N/A Epithelialization: Debridement - Selective/Open Wound N/A N/A Debridement: Pre-procedure Verification/Time Out 15:25 N/A N/A Taken: Slough N/A N/A Tissue Debrided: Skin/Epidermis N/A N/A Level: 0.24 N/A N/A Debridement A (sq cm): rea Curette N/A N/A Instrument: Minimum N/A N/A Bleeding: Pressure N/A N/A Hemostasis A chieved: 0 N/A N/A Procedural Pain: 0 N/A N/A Post Procedural Pain: Procedure was tolerated well N/A N/A Debridement Treatment Response: 0.3x1x0.1 N/A N/A Post Debridement Measurements L x W x D (cm) 0.024 N/A N/A Post Debridement Volume: (cm) Excoriation: No  N/A N/A Periwound Skin Texture: Induration: No Callus: No Crepitus: No Rash: No Scarring: No Dry/Scaly: Yes N/A N/A Periwound Skin Moisture: Maceration: No Atrophie Blanche: No N/A N/A Periwound Skin Color: Cyanosis: No Ecchymosis: No Erythema: No Hemosiderin Staining: No Mottled: No Pallor: No Rubor: No No Abnormality N/A N/A Temperature: Yes N/A N/A Tenderness on Palpation: Debridement N/A N/A Procedures Performed: Treatment Notes Electronic Signature(s) Signed: 10/25/2022 4:06:25 PM By: [name] DO Entered By: [name] on 10/25/2022 15:45:11 -------------------------------------------------------------------------------- Multi-Disciplinary Care Plan Details Patient Name: Date of Service: [name] D. 10/25/2022 2:45 PM Medical Record Number: [id number] Patient Account Number: [id number] Date of Birth/Sex: Treating RN: 14-Dec-1955 (67 y.o. M) Primary Care Calaya Gildner: Jeanann Lewandowsky Other Clinician: Referring Elloise Roark: Treating Jawara Latorre/Extender: Glade Nurse Weeks in Treatment: 1 [place], [place] D ([id number]) [id number] Page 4 of 7 Multidisciplinary Care Plan reviewed with physician Active Inactive Wound/Skin Impairment Nursing Diagnoses: Impaired tissue integrity Knowledge deficit related to ulceration/compromised skin integrity Goals: Patient will have a decrease in wound volume by X% from date: (specify in notes) Date Initiated: 10/12/2022 Target Resolution Date: 10/25/2022 Goal Status: Active Patient/caregiver will verbalize understanding of skin care regimen Date Initiated: 10/12/2022 Target Resolution Date: 10/25/2022 Goal Status: Active Ulcer/skin breakdown will have a volume reduction of 30% by week 4 Date Initiated: 10/12/2022 Target Resolution Date: 10/26/2022 Goal Status: Active Interventions: Assess patient/caregiver ability to obtain necessary supplies Assess  patient/caregiver ability to perform ulcer/skin care regimen upon admission and as needed Assess ulceration(s) every visit Notes: Electronic Signature(s) Signed: 10/25/2022 3:50:37 PM By: Pearletha Alfred Entered By: Pearletha Alfred on 10/25/2022 15:50:37 -------------------------------------------------------------------------------- Pain Assessment Details Patient Name: Date of Service: [name] D. 10/25/2022 2:45 PM Medical Record Number: [id number] Patient Account Number: [id number] Date of Birth/Sex: Treating RN: [birth year] (67 y.o. M) Primary Care Jisele Price: Jeanann Lewandowsky Other Clinician: Referring Braya Habermehl: Treating Lamonte Hartt/Extender: Glade Nurse Weeks in Treatment: 1 Active Problems Location of Pain Severity and Description of Pain Patient Has Paino No Site Locations Pain Management and Medication BIRT, BIEDRZYCKI D ([id number]) [phone].pdf Page 5 of 7 Current Pain Management: Electronic Signature(s) Signed: 12/17/2022 4:30:35 PM By: Thayer Dallas Entered By: Thayer Dallas on 10/25/2022 14:55:49 -------------------------------------------------------------------------------- Patient/Caregiver Education Details Patient Name: Date of Service: [facility], [facility] NZO D. 5/9/2024andnbsp2:45 PM Medical Record Number: [id number] Patient Account Number: [id number] Date of Birth/Gender: Treating RN: 1955-12-24 (67 y.o. M) Primary Care Physician: Jeanann Lewandowsky Other Clinician: Referring  Physician: Treating Physician/Extender: Glade Nurse Weeks in Treatment: 1 Education Assessment Education Provided To: Patient Education Topics Provided Wound Debridement: Methods: Explain/Verbal Responses: State content correctly Wound/Skin Impairment: Methods: Explain/Verbal Responses: State content correctly Electronic Signature(s) Signed: 11/01/2022 12:37:44 PM By: Pearletha Alfred Entered By: Pearletha Alfred  on 10/25/2022 15:50:58 -------------------------------------------------------------------------------- Wound Assessment Details Patient Name: Date of Service: [name] NZ[facility] D. 10/25/2022 2:45 PM Medical Record Number: [id number] Patient Account Number: [id number] Date of Birth/Sex: Treating RN: September 16, 1955 (67 y.o. M) Primary Care Jeremian Whitby: Jeanann Lewandowsky Other Clinician: Referring Jemila Camille: Treating Lovene Maret/Extender: [name], Olugbemiga Weeks in Treatment: 1 Wound Status Wound Number: 1 Primary Venous Leg Ulcer Etiology: Wound Location: Left, Lateral Foot Wound Status: Open Wounding Event: Gradually Appeared Comorbid Sickle Cell Disease, Arrhythmia, Congestive Heart Failure, Date Acquired: 07/20/2022 History: Gout Weeks Of Treatment: 1 Clustered Wound: No Photos TAEVIN, HLAVAC D ([id number]) [id number] Page 6 of 7 Wound Measurements Length: (cm) 0.3 Width: (cm) 1 Depth: (cm) 0.1 Area: (cm) 0.236 Volume: (cm) 0.024 % Reduction in Area: 66.6% % Reduction in Volume: 66.2% Epithelialization: Small (1-33%) Tunneling: No Undermining: No Wound Description Classification: Full Thickness Without Exposed Support Wound Margin: Distinct, outline attached Exudate Amount: Medium Exudate Type: Serosanguineous Exudate Color: red, brown Structures Foul Odor After Cleansing: No Slough/Fibrino Yes Wound Bed Granulation Amount: Small (1-33%) Exposed Structure Granulation Quality: Red, Pink Fascia Exposed: No Necrotic Amount: Large (67-100%) Fat Layer (Subcutaneous Tissue) Exposed: No Necrotic Quality: Eschar, Adherent Slough Tendon Exposed: No Muscle Exposed: No Joint Exposed: No Bone Exposed: No Periwound Skin Texture Texture Color No Abnormalities Noted: No No Abnormalities Noted: No Callus: No Atrophie Blanche: No Crepitus: No Cyanosis: No Excoriation: No Ecchymosis: No Induration: No Erythema: No Rash:  No Hemosiderin Staining: No Scarring: No Mottled: No Pallor: No Moisture Rubor: No No Abnormalities Noted: No Dry / Scaly: Yes Temperature / Pain Maceration: No Temperature: No Abnormality Tenderness on Palpation: Yes Electronic Signature(s) Signed: 12/17/2022 4:30:35 PM By: Thayer Dallas Entered By: Thayer Dallas on 10/25/2022 15:17:00 -------------------------------------------------------------------------------- Vitals Details Patient Name: Date of Service: [name], [facility] NZO D. 10/25/2022 2:45 PM Medical Record Number: [id number] Patient Account Number: [id number] Date of Birth/Sex: Treating RN: 25-Aug-1955 (67 y.o. M) Primary Care Richar Dunklee: Jeanann Lewandowsky Other Clinician: Referring Dion Parrow: Treating Delbert Vu/Extender: [name], Olugbemiga Weeks in Treatment: 1 Vital [address] KHELAN, SLEMMER D ([id number]) [id number] Page 7 of 7 Time Taken: 14:51 Temperature (F): 98.2 Pulse (bpm): 85 Respiratory Rate (breaths/min): 18 Blood Pressure (mmHg): 105/64 Reference Range: 80 - 120 mg / dl Electronic Signature(s) Signed: 12/17/2022 4:30:35 PM By: Thayer Dallas Entered By: Thayer Dallas on 10/25/2022 14:52:32

## 2022-12-17 NOTE — Progress Notes (Signed)
Patrick [name], Patrick [name] ([id number]) [id number] Page 1 of 8 Visit Report for 11/01/2022 Chief Complaint Document Details Patient Name: Date of Service: [facility] [name]. 11/01/2022 2:15 PM Medical Record Number: [id number] Patient Account Number: [id number] Date of Birth/Sex: Treating RN: 1956/06/17 (67 y.o. M) Primary Care Provider: Jeanann Lewandowsky Other Clinician: Referring Provider: Treating Provider/Extender: Glade Nurse Weeks in Treatment: 2 Information Obtained from: Patient Chief Complaint 10/12/2022; left lower extremity wound Electronic Signature(s) Signed: 11/01/2022 4:21:44 PM By: [name] DO Entered By: [name] on 11/01/2022 15:26:56 -------------------------------------------------------------------------------- Debridement Details Patient Name: Date of Service: [facility], [name] 11/01/2022 2:15 PM Medical Record Number: [id number] Patient Account Number: [id number] Date of Birth/Sex: Treating RN: [birth year] (67 y.o. Tammy Sours Primary Care Provider: Jeanann Lewandowsky Other Clinician: Referring Provider: Treating Provider/Extender: Glade Nurse Weeks in Treatment: 2 Debridement Performed for Assessment: Wound #1 Left,Lateral Foot Performed By: Physician [name], DO Debridement Type: Debridement Severity of Tissue Pre Debridement: Fat layer exposed Level of Consciousness (Pre-procedure): Awake and Alert Pre-procedure Verification/Time Out Yes - 14:40 Taken: Start Time: 14:41 Pain Control: Lidocaine 5% topical ointment Percent of Wound Bed Debrided: 100% T Area Debrided (cm): otal 0.78 Tissue and other material debrided: Non-Viable, Eschar, Skin: Dermis , Skin: Epidermis Level: Skin/Epidermis Debridement Description: Selective/Open Wound Instrument: Curette Bleeding: Minimum Hemostasis Achieved: Pressure End Time: 14:44 Procedural Pain: 0 Post  Procedural Pain: 0 Response to Treatment: Procedure was tolerated well Level of Consciousness (Post- Awake and Alert procedure): Post Debridement Measurements of Total Wound Length: (cm) 1 Width: (cm) 1 Depth: (cm) 0.1 Volume: (cm) 0.079 Character of Wound/Ulcer Post Debridement: Improved Patrick [name], Patrick [name] ([id number]) [id number].pdf Page 2 of 8 Severity of Tissue Post Debridement: Fat layer exposed Post Procedure Diagnosis Same as Pre-procedure Electronic Signature(s) Signed: 11/01/2022 4:21:44 PM By: [name] DO Signed: 11/01/2022 5:13:46 PM By: [name] RN, BSN Entered By: [name] on 11/01/2022 14:44:26 -------------------------------------------------------------------------------- HPI Details Patient Name: Date of Service: [facility], [name] 11/01/2022 2:15 PM Medical Record Number: [id number] Patient Account Number: [id number] Date of Birth/Sex: Treating RN: Mar 26, 1956 (67 y.o. M) Primary Care Provider: Jeanann Lewandowsky Other Clinician: Referring Provider: Treating Provider/Extender: Glade Nurse Weeks in Treatment: 2 History of Present Illness HPI Description: 10/12/2022 [name] is a 67 year old male with a past medical history of end-stage renal disease on hemodialysis, sickle cell disease, blindness, atrial fibrillation on blood thinner and chronic systolic congestive heart failure that presents to the clinic for a 2 to 5-month history of nonhealing ulcer to the left lateral foot. He is not sure how it started. He recently saw his primary care physician who started him on doxycycline. He has been keeping the area covered. He currently denies signs of infection. 5/3; patient presents for follow-up. We have been using antibiotic ointment with Hydrofera Blue under Kerlix/Coban to the left lower extremity. The wound is smaller. He tolerated the wrap well. He has no issues or complaints  today. 5/9; patient presents for follow-up. We have been using antibiotic ointment with Hydrofera Blue under Kerlix/Coban to the left lower extremity. The wound is smaller. He has no issues or complaints today. 5/16; patient presents for follow-up. We have been using antibiotic ointment with Hydrofera Blue under Kerlix/Coban to the left lower extremity. Wound is smaller. Electronic Signature(s) Signed: 11/01/2022 4:21:44 PM By: [name] DO Entered By: [name] on 11/01/2022 15:27:18 -------------------------------------------------------------------------------- Physical Exam Details Patient Name: Date of Service: Patrick [name],  [name] 11/01/2022 2:15 PM Medical Record Number: [id number] Patient Account Number: [id number] Date of Birth/Sex: Treating RN: March 09, 1956 (67 y.o. M) Primary Care Provider: Jeanann Lewandowsky Other Clinician: Referring Provider: Treating Provider/Extender: [name], Olugbemiga Weeks in Treatment: 2 Constitutional respirations regular, non-labored and within target range for patient.. Cardiovascular 2+ dorsalis pedis/posterior tibialis pulses. Psychiatric pleasant and cooperative. Patrick [name], Patrick [name] ([id number]) [id number] Page 3 of 8 Notes T the left lateral foot there is an open wound with nonviable tissue and granulation tissue. No signs of surrounding infection. Good edema control. o Electronic Signature(s) Signed: 11/01/2022 4:21:44 PM By: [name] DO Entered By: [name] on 11/01/2022 15:27:34 -------------------------------------------------------------------------------- Physician Orders Details Patient Name: Date of Service: [facility], [name] 11/01/2022 2:15 PM Medical Record Number: [id number] Patient Account Number: [id number] Date of Birth/Sex: Treating RN: [birth year] (67 y.o. Tammy Sours Primary Care Provider: Jeanann Lewandowsky Other Clinician: Referring  Provider: Treating Provider/Extender: Glade Nurse Weeks in Treatment: 2 Verbal / Phone Orders: No Diagnosis Coding ICD-10 Coding Code Description [phone] Non-pressure chronic ulcer of other part of left lower leg with fat layer exposed I87.312 Chronic venous hypertension (idiopathic) with ulcer of left lower extremity N18.6 End stage renal disease D57.1 Sickle-cell disease without crisis J44.9 Chronic obstructive pulmonary disease, unspecified H54.8 Legal blindness, as defined in [place] Follow-up Appointments ppointment in 1 week. - Dr. Mikey Bussing 11/08/2022 230pm room 7 Return A Anesthetic (In clinic) Topical Lidocaine 5% applied to wound bed Bathing/ Shower/ Hygiene May shower with protection but do not get wound dressing(s) wet. Protect dressing(s) with water repellant cover (for example, large plastic bag) or a cast cover and may then take shower. Edema Control - Lymphedema / SCD / Other Elevate legs to the level of the heart or above for 30 minutes daily and/or when sitting for 3-4 times a day throughout the day. Avoid standing for long periods of time. Wound Treatment Wound #1 - Foot Wound Laterality: Left, Lateral Cleanser: Soap and Water 1 x Per Week/15 Days Discharge Instructions: May shower and wash wound with dial antibacterial soap and water prior to dressing change. Cleanser: Vashe 5.8 (oz) 1 x Per Week/15 Days Discharge Instructions: Cleanse the wound with Vashe prior to applying a clean dressing using gauze sponges, not tissue or cotton balls. Prim Dressing: Hydrofera Blue Ready Transfer Foam, 2.5x2.5 (in/in) 1 x Per Week/15 Days ary Discharge Instructions: Apply directly to wound bed as directed Secondary Dressing: ABD Pad, 5x9 1 x Per Week/15 Days Discharge Instructions: Apply over primary dressing as directed. Compression Wrap: Kerlix Roll 4.5x3.1 (in/yd) 1 x Per Week/15 Days Discharge Instructions: Apply Kerlix and Coban compression as  directed. Compression Wrap: Coban Self-Adherent Wrap 4x5 (in/yd) 1 x Per Week/15 Days Discharge Instructions: Apply over Kerlix as directed. Electronic Signature(s) Signed: 11/01/2022 4:21:44 PM By: Thalia Party, Cassidy [name] ([id number]) [name] DO [phone].pdf Page 4 of 8 Signed: 11/01/2022 4:21:44 PM By: Margaret Pyle By: [name] on 11/01/2022 15:27:40 -------------------------------------------------------------------------------- Problem List Details Patient Name: Date of Service: [facility], A [facility] [name]. 11/01/2022 2:15 PM Medical Record Number: [id number] Patient Account Number: [id number] Date of Birth/Sex: Treating RN: 1956/02/12 (67 y.o. Tammy Sours Primary Care Provider: Jeanann Lewandowsky Other Clinician: Referring Provider: Treating Provider/Extender: Glade Nurse Weeks in Treatment: 2 Active Problems ICD-10 Encounter Code Description Active Date MDM Diagnosis [phone] Non-pressure chronic ulcer of other part of left lower leg with fat layer exposed4/26/2024 No Yes I87.312 Chronic venous hypertension (  idiopathic) with ulcer of left lower extremity 10/12/2022 No Yes N18.6 End stage renal disease 10/12/2022 No Yes D57.1 Sickle-cell disease without crisis 10/12/2022 No Yes J44.9 Chronic obstructive pulmonary disease, unspecified 10/12/2022 No Yes H54.8 Legal blindness, as defined in [place] 10/12/2022 No Yes Inactive Problems Resolved Problems Electronic Signature(s) Signed: 11/01/2022 4:21:44 PM By: [name] DO Entered By: [name] on 11/01/2022 15:26:44 -------------------------------------------------------------------------------- Progress Note Details Patient Name: Date of Service: [facility], [name] 11/01/2022 2:15 PM Medical Record Number: [id number] Patient Account Number: [id number] Date of Birth/Sex: Treating RN: 1956-02-11 (67 y.o. M) Primary Care Provider: Jeanann Lewandowsky Other Clinician: Referring Provider: Treating Provider/Extender: Glade Nurse Weeks in Treatment: 2 [place], Evlyn Clines [name] ([id number]) [id number] Page 5 of 8 Subjective Chief Complaint Information obtained from Patient 10/12/2022; left lower extremity wound History of Present Illness (HPI) 10/12/2022 Mr. Patrick Spare is a 67 year old male with a past medical history of end-stage renal disease on hemodialysis, sickle cell disease, blindness, atrial fibrillation on blood thinner and chronic systolic congestive heart failure that presents to the clinic for a 2 to 63-month history of nonhealing ulcer to the left lateral foot. He is not sure how it started. He recently saw his primary care physician who started him on doxycycline. He has been keeping the area covered. He currently denies signs of infection. 5/3; patient presents for follow-up. We have been using antibiotic ointment with Hydrofera Blue under Kerlix/Coban to the left lower extremity. The wound is smaller. He tolerated the wrap well. He has no issues or complaints today. 5/9; patient presents for follow-up. We have been using antibiotic ointment with Hydrofera Blue under Kerlix/Coban to the left lower extremity. The wound is smaller. He has no issues or complaints today. 5/16; patient presents for follow-up. We have been using antibiotic ointment with Hydrofera Blue under Kerlix/Coban to the left lower extremity. Wound is smaller. Patient History Information obtained from Patient, Chart. Family History Heart Disease - Mother, No family history of Cancer, Diabetes, Hereditary Spherocytosis, Hypertension, Kidney Disease, Lung Disease, Seizures, Stroke, Thyroid Problems, Tuberculosis. Social History Former smoker, Alcohol Use - Never, Drug Use - No History, Caffeine Use - Rarely. Medical History Hematologic/Lymphatic Patient has history of Sickle Cell  Disease Cardiovascular Patient has history of Arrhythmia - A fib, Congestive Heart Failure Denies history of Hypertension Musculoskeletal Patient has history of Gout Medical A Surgical History Notes nd Cardiovascular mitral regurgitation; tricuspid regurgitation!!! Objective Constitutional respirations regular, non-labored and within target range for patient.. Vitals Time Taken: 2:13 PM, Temperature: 98.1 F, Pulse: 87 bpm, Respiratory Rate: 16 breaths/min, Blood Pressure: 107/62 mmHg. Cardiovascular 2+ dorsalis pedis/posterior tibialis pulses. Psychiatric pleasant and cooperative. General Notes: T the left lateral foot there is an open wound with nonviable tissue and granulation tissue. No signs of surrounding infection. Good edema o control. Integumentary (Hair, Skin) Wound #1 status is Open. Original cause of wound was Gradually Appeared. The date acquired was: 07/20/2022. The wound has been in treatment 2 weeks. The wound is located on the Left,Lateral Foot. The wound measures 0.1cm length x 0.1cm width x 0.1cm depth; 0.008cm^2 area and 0.001cm^3 volume. There is no tunneling or undermining noted. There is a medium amount of serosanguineous drainage noted. The wound margin is distinct with the outline attached to the wound base. There is small (1-33%) red, pink granulation within the wound bed. There is a large (67-100%) amount of necrotic tissue within the wound bed including Adherent Slough. The periwound skin appearance exhibited: Dry/Scaly. The periwound  skin appearance did not exhibit: Callus, Crepitus, Excoriation, Induration, Rash, Scarring, Maceration, Atrophie Blanche, Cyanosis, Ecchymosis, Hemosiderin Staining, Mottled, Pallor, Rubor, Erythema. Periwound temperature was noted as No Abnormality. The periwound has tenderness on palpation. Assessment Patrick [name], Patrick [name] ([id number]) [id number] Page 6 of 8 Active Problems ICD-10 Non-pressure  chronic ulcer of other part of left lower leg with fat layer exposed Chronic venous hypertension (idiopathic) with ulcer of left lower extremity End stage renal disease Sickle-cell disease without crisis Chronic obstructive pulmonary disease, unspecified Legal blindness, as defined in [place] Patient's wound has shown improvement in size appearance as last clinic visit. I debrided nonviable tissue. I recommended continuing Hydrofera Blue under Kerlix/Coban and stopping the antibiotic ointment as the wound is almost healed. Procedures Wound #1 Pre-procedure diagnosis of Wound #1 is a Venous Leg Ulcer located on the Left,Lateral Foot .Severity of Tissue Pre Debridement is: Fat layer exposed. There was a Selective/Open Wound Skin/Epidermis Debridement with a total area of 0.78 sq cm performed by [name], DO. With the following instrument(s): Curette to remove Non-Viable tissue/material. Material removed includes Eschar, Skin: Dermis, and Skin: Epidermis after achieving pain control using Lidocaine 5% topical ointment. A time out was conducted at 14:40, prior to the start of the procedure. A Minimum amount of bleeding was controlled with Pressure. The procedure was tolerated well with a pain level of 0 throughout and a pain level of 0 following the procedure. Post Debridement Measurements: 1cm length x 1cm width x 0.1cm depth; 0.079cm^3 volume. Character of Wound/Ulcer Post Debridement is improved. Severity of Tissue Post Debridement is: Fat layer exposed. Post procedure Diagnosis Wound #1: Same as Pre-Procedure Plan Follow-up Appointments: Return Appointment in 1 week. - Dr. Mikey Bussing 11/08/2022 230pm room 7 Anesthetic: (In clinic) Topical Lidocaine 5% applied to wound bed Bathing/ Shower/ Hygiene: May shower with protection but do not get wound dressing(s) wet. Protect dressing(s) with water repellant cover (for example, large plastic bag) or a cast cover and may then take shower. Edema  Control - Lymphedema / SCD / Other: Elevate legs to the level of the heart or above for 30 minutes daily and/or when sitting for 3-4 times a day throughout the day. Avoid standing for long periods of time. WOUND #1: - Foot Wound Laterality: Left, Lateral Cleanser: Soap and Water 1 x Per Week/15 Days Discharge Instructions: May shower and wash wound with dial antibacterial soap and water prior to dressing change. Cleanser: Vashe 5.8 (oz) 1 x Per Week/15 Days Discharge Instructions: Cleanse the wound with Vashe prior to applying a clean dressing using gauze sponges, not tissue or cotton balls. Prim Dressing: Hydrofera Blue Ready Transfer Foam, 2.5x2.5 (in/in) 1 x Per Week/15 Days ary Discharge Instructions: Apply directly to wound bed as directed Secondary Dressing: ABD Pad, 5x9 1 x Per Week/15 Days Discharge Instructions: Apply over primary dressing as directed. Com pression Wrap: Kerlix Roll 4.5x3.1 (in/yd) 1 x Per Week/15 Days Discharge Instructions: Apply Kerlix and Coban compression as directed. Com pression Wrap: Coban Self-Adherent Wrap 4x5 (in/yd) 1 x Per Week/15 Days Discharge Instructions: Apply over Kerlix as directed. 1. In office sharp debridement 2. Hydrofera Blue under Kerlix/Cobanleft lower extremity 3. Follow-up in 1 week Electronic Signature(s) Signed: 12/17/2022 2:04:16 PM By: Pearletha Alfred Signed: 12/17/2022 2:57:08 PM By: [name] DO Previous Signature: 11/01/2022 4:21:44 PM Version By: [name] DO Entered By: Pearletha Alfred on 12/17/2022 14:04:15 HxROS Details -------------------------------------------------------------------------------- Patrick [name] ([id number]) [id number] Page 7 of 8 Patient Name: Date of Service: [facility] [name], A [name]  [name]. 11/01/2022 2:15 PM Medical Record Number: [id number] Patient Account Number: [id number] Date of Birth/Sex: Treating RN: 1956-01-17 (67 y.o. M) Primary Care Provider: Jeanann Lewandowsky  Other Clinician: Referring Provider: Treating Provider/Extender: Glade Nurse Weeks in Treatment: 2 Information Obtained From Patient Chart Hematologic/Lymphatic Medical History: Positive for: Sickle Cell Disease Cardiovascular Medical History: Positive for: Arrhythmia - A fib; Congestive Heart Failure Negative for: Hypertension Past Medical History Notes: mitral regurgitation; tricuspid regurgitation!!! Musculoskeletal Medical History: Positive for: Gout Immunizations Implantable Devices Yes Family and Social History Cancer: No; Diabetes: No; Heart Disease: Yes - Mother; Hereditary Spherocytosis: No; Hypertension: No; Kidney Disease: No; Lung Disease: No; Seizures: No; Stroke: No; Thyroid Problems: No; Tuberculosis: No; Former smoker; Alcohol Use: Never; Drug Use: No History; Caffeine Use: Rarely; Financial Concerns: No; Food, Clothing or Shelter Needs: No; Support System Lacking: No; Transportation Concerns: No Electronic Signature(s) Signed: 11/01/2022 4:21:44 PM By: [name] DO Entered By: [name] on 11/01/2022 15:27:23 -------------------------------------------------------------------------------- SuperBill Details Patient Name: Date of Service: [facility], [name] 11/01/2022 Medical Record Number: [id number] Patient Account Number: [id number] Date of Birth/Sex: Treating RN: Apr 23, 1956 (67 y.o. Tammy Sours Primary Care Provider: Jeanann Lewandowsky Other Clinician: Referring Provider: Treating Provider/Extender: Glade Nurse Weeks in Treatment: 2 Diagnosis Coding ICD-10 Codes Code Description [phone] Non-pressure chronic ulcer of other part of left lower leg with fat layer exposed I87.312 Chronic venous hypertension (idiopathic) with ulcer of left lower extremity N18.6 End stage renal disease D57.1 Sickle-cell disease without crisis J44.9 Chronic obstructive pulmonary disease, unspecified H54.8  Legal blindness, as defined in [place] Facility Procedures TAMARA, DEMAREST [name] ([id number]): CPT4 Code Description [phone] - DEBRIDE WOUND 1ST 20 SQ CM OR < ICD-10 Diagnosis Description L97.822 Non-pressure chronic ulcer of other part of left lower leg with fat I87.312 Chronic venous hypertension  (idiopathic) with ulcer of left lower e [phone].pdf Page 8 of 8: Modifier Quantity 1 layer exposed xtremity Physician Procedures : CPT4 Code Description Modifier [id number] - WC PHYS DEBR WO ANESTH 20 SQ CM ICD-10 Diagnosis Description L97.822 Non-pressure chronic ulcer of other part of left lower leg with fat layer exposed I87.312 Chronic venous hypertension (idiopathic) with  ulcer of left lower extremity Quantity: 1 Electronic Signature(s) Signed: 11/01/2022 4:21:44 PM By: [name] DO Entered By: [name] on 11/01/2022 15:28:39

## 2022-12-17 NOTE — Progress Notes (Signed)
Patrick [name] [name] ([id number]) [id number] Page 1 of 8 Visit Report for 10/19/2022 Chief Complaint Document Details Patient Name: Date of Service: Patrick [name]. 10/19/2022 10:15 A M Medical Record Number: [id number] Patient Account Number: [id number] Date of Birth/Sex: Treating RN: [name] Primary Care Provider: Jeanann Lewandowsky Other Clinician: Referring Provider: Treating Provider/Extender: Glade Nurse Weeks in Treatment: 1 Information Obtained from: Patient Chief Complaint 10/12/2022; left lower extremity wound Electronic Signature(s) Signed: 10/19/2022 11:50:33 AM By: [name] DO Entered By: [name] on 10/19/2022 10:50:11 -------------------------------------------------------------------------------- Debridement Details Patient Name: Date of Service: Patrick [name]. 10/19/2022 10:15 A M Medical Record Number: [id number] Patient Account Number: [id number] Date of Birth/Sex: Treating RN: Patrick [name] (67 y.o. Patrick [name], Patrick [name] Primary Care Provider: Jeanann Lewandowsky Other Clinician: Referring Provider: Treating Provider/Extender: Glade Nurse Weeks in Treatment: 1 Debridement Performed for Assessment: Wound #1 Left,Lateral Foot Performed By: Physician [name], DO Debridement Type: Debridement Severity of Tissue Pre Debridement: Fat layer exposed Level of Consciousness (Pre-procedure): Awake and Alert Pre-procedure Verification/Time Out Yes - 10:35 Taken: Start Time: 10:35 Pain Control: Lidocaine Percent of Wound Bed Debrided: 100% T Area Debrided (cm): otal 0.22 Tissue and other material debrided: Viable, Non-Viable, Slough, Skin: Dermis , Skin: Epidermis, Slough Level: Skin/Epidermis Debridement Description: Selective/Open Wound Instrument: Curette Bleeding: Minimum Hemostasis Achieved: Pressure End Time: 10:35 Procedural Pain:  0 Post Procedural Pain: 0 Response to Treatment: Procedure was tolerated well Level of Consciousness (Post- Awake and Alert procedure): Post Debridement Measurements of Total Wound Length: (cm) 0.4 Width: (cm) 0.7 Depth: (cm) 0.1 Volume: (cm) 0.022 Character of Wound/Ulcer Post Debridement: Improved Patrick [name], Patrick [name] ([id number]) [phone].pdf Page 2 of 8 Severity of Tissue Post Debridement: Fat layer exposed Post Procedure Diagnosis Same as Pre-procedure Electronic Signature(s) Signed: 10/19/2022 11:50:33 AM By: [name] DO Signed: 10/19/2022 12:49:31 PM By: Fonnie Mu RN Entered By: Fonnie Mu on 10/19/2022 10:38:28 -------------------------------------------------------------------------------- HPI Details Patient Name: Date of Service: Patrick [name]. 10/19/2022 10:15 A M Medical Record Number: [id number] Patient Account Number: [id number] Date of Birth/Sex: Treating RN: Patrick [name], Patrick [name] (67 y.o. M) Primary Care Provider: Jeanann Lewandowsky Other Clinician: Referring Provider: Treating Provider/Extender: Glade Nurse Weeks in Treatment: 1 History of Present Illness HPI Description: 10/12/2022 Patrick [name] is a 67 year old male with a past medical history of end-stage renal disease on hemodialysis, sickle cell disease, blindness, atrial fibrillation on blood thinner and chronic systolic congestive heart failure that presents to the clinic for a 2 to 35-month history of nonhealing ulcer to the left lateral foot. He is not sure how it started. He recently saw his primary care physician who started him on doxycycline. He has been keeping the area covered. He currently denies signs of infection. 5/3; patient presents for follow-up. We have been using antibiotic ointment with Hydrofera Blue under Kerlix/Coban to the left lower extremity. The wound is smaller. He tolerated the wrap well. He has no issues or  complaints today. Electronic Signature(s) Signed: 10/19/2022 11:50:33 AM By: [name] DO Entered By: [name] on 10/19/2022 10:50:39 -------------------------------------------------------------------------------- Physical Exam Details Patient Name: Date of Service: Patrick [name]. 10/19/2022 10:15 A M Medical Record Number: [id number] Patient Account Number: [id number] Date of Birth/Sex: Treating RN: [name], [name] Primary Care Provider: Jeanann Lewandowsky Other Clinician: Referring Provider: Treating Provider/Extender: Patrick [name], Patrick [name] Weeks in Treatment: 1 Constitutional respirations regular, non-labored and within target range for patient.. Cardiovascular  2+ dorsalis pedis/posterior tibialis pulses. Psychiatric pleasant and cooperative. Notes T the left lateral foot there is an open wound with nonviable tissue and granulation tissue. No signs of surrounding infection. Good edema control. o Electronic Signature(s) Patrick [name], Patrick [name] ([id number]) [id number] Page 3 of 8 Signed: 10/19/2022 11:50:33 AM By: [name] DO Entered By: [name] on 10/19/2022 10:51:14 -------------------------------------------------------------------------------- Physician Orders Details Patient Name: Date of Service: Patrick [name]. 10/19/2022 10:15 A M Medical Record Number: [id number] Patient Account Number: [id number] Date of Birth/Sex: Treating RN: [birth year] (67 y.o. Patrick [name] Primary Care Provider: Jeanann Lewandowsky Other Clinician: Referring Provider: Treating Provider/Extender: Glade Nurse Weeks in Treatment: 1 Verbal / Phone Orders: No Diagnosis Coding Follow-up Appointments ppointment in 1 week. - w/ Dr. Mikey Bussing and Maryruth Bun Rm # 9 Thursday 10/25/22 @ 8:15 Return A Anesthetic (In clinic) Topical Lidocaine 5% applied to wound bed Bathing/ Shower/  Hygiene May shower with protection but do not get wound dressing(s) wet. Protect dressing(s) with water repellant cover (for example, large plastic bag) or a cast cover and may then take shower. Edema Control - Lymphedema / SCD / Other Elevate legs to the level of the heart or above for 30 minutes daily and/or when sitting for 3-4 times a day throughout the day. Avoid standing for long periods of time. Wound Treatment Wound #1 - Foot Wound Laterality: Left, Lateral Cleanser: Soap and Water 1 x Per Week/15 Days Discharge Instructions: May shower and wash wound with dial antibacterial soap and water prior to dressing change. Cleanser: Vashe 5.8 (oz) 1 x Per Week/15 Days Discharge Instructions: Cleanse the wound with Vashe prior to applying a clean dressing using gauze sponges, not tissue or cotton balls. Topical: Gentamicin 1 x Per Week/15 Days Discharge Instructions: As directed by physician Topical: Mupirocin Ointment 1 x Per Week/15 Days Discharge Instructions: Apply Mupirocin (Bactroban) as instructed Prim Dressing: Hydrofera Blue Ready Transfer Foam, 2.5x2.5 (in/in) 1 x Per Week/15 Days ary Discharge Instructions: Apply directly to wound bed as directed Secondary Dressing: ABD Pad, 5x9 1 x Per Week/15 Days Discharge Instructions: Apply over primary dressing as directed. Compression Wrap: Kerlix Roll 4.5x3.1 (in/yd) 1 x Per Week/15 Days Discharge Instructions: Apply Kerlix and Coban compression as directed. Compression Wrap: Coban Self-Adherent Wrap 4x5 (in/yd) 1 x Per Week/15 Days Discharge Instructions: Apply over Kerlix as directed. Electronic Signature(s) Signed: 10/19/2022 11:50:33 AM By: [name] DO Entered By: [name] on 10/19/2022 10:51:21 Patrick [name] ([id number]) [id number] Page 4 of 8 -------------------------------------------------------------------------------- Problem List Details Patient Name: Date of Service: [facility] [name]. 10/19/2022 10:15 A M Medical Record Number: [id number] Patient Account Number: [id number] Date of Birth/Sex: Treating RN: [birth year] (67 y.o. M) Primary Care Provider: Jeanann Lewandowsky Other Clinician: Referring Provider: Treating Provider/Extender: Glade Nurse Weeks in Treatment: 1 Active Problems ICD-10 Encounter Code Description Active Date MDM Diagnosis [phone] Non-pressure chronic ulcer of other part of left lower leg with fat layer exposed4/26/2024 No Yes I87.312 Chronic venous hypertension (idiopathic) with ulcer of left lower extremity 10/12/2022 No Yes N18.6 End stage renal disease 10/12/2022 No Yes D57.1 Sickle-cell disease without crisis 10/12/2022 No Yes J44.9 Chronic obstructive pulmonary disease, unspecified 10/12/2022 No Yes H54.8 Legal blindness, as defined in [place] 10/12/2022 No Yes Inactive Problems Resolved Problems Electronic Signature(s) Signed: 10/19/2022 11:50:33 AM By: [name] DO Entered By: [name] on 10/19/2022 10:49:59 -------------------------------------------------------------------------------- Progress Note Details Patient Name: Date of Service: Patrick [name], [name]  10/19/2022 10:15 A M Medical Record Number: [id number] Patient Account Number: [id number] Date of Birth/Sex: Treating RN: October [name], Patrick [name] (67 y.o. M) Primary Care Provider: Jeanann Lewandowsky Other Clinician: Referring Provider: Treating Provider/Extender: Glade Nurse Weeks in Treatment: 1 Subjective Chief Complaint Information obtained from Patient 10/12/2022; left lower extremity wound History of Present Illness (HPI) 10/12/2022 Patrick [name] is a 67 year old male with a past medical history of end-stage renal disease on hemodialysis, sickle cell disease, blindness, atrial fibrillation on blood thinner and chronic systolic congestive heart failure that presents to the clinic for a 2 to 78-month history of  nonhealing ulcer to the left [place], [facility] [name] ([id number]) [id number] Page 5 of 8 lateral foot. He is not sure how it started. He recently saw his primary care physician who started him on doxycycline. He has been keeping the area covered. He currently denies signs of infection. 5/3; patient presents for follow-up. We have been using antibiotic ointment with Hydrofera Blue under Kerlix/Coban to the left lower extremity. The wound is smaller. He tolerated the wrap well. He has no issues or complaints today. Patient History Information obtained from Patient, Chart. Family History Heart Disease - Mother, No family history of Cancer, Diabetes, Hereditary Spherocytosis, Hypertension, Kidney Disease, Lung Disease, Seizures, Stroke, Thyroid Problems, Tuberculosis. Social History Former smoker, Alcohol Use - Never, Drug Use - No History, Caffeine Use - Rarely. Medical History Hematologic/Lymphatic Patient has history of Sickle Cell Disease Cardiovascular Patient has history of Arrhythmia - A fib, Congestive Heart Failure Denies history of Hypertension Musculoskeletal Patient has history of Gout Medical A Surgical History Notes nd Cardiovascular mitral regurgitation; tricuspid regurgitation!!! Objective Constitutional respirations regular, non-labored and within target range for patient.. Vitals Time Taken: 10:05 AM, Temperature: 97.7 F, Pulse: 66 bpm, Respiratory Rate: 17 breaths/min, Blood Pressure: 76/45 mmHg. Cardiovascular 2+ dorsalis pedis/posterior tibialis pulses. Psychiatric pleasant and cooperative. General Notes: T the left lateral foot there is an open wound with nonviable tissue and granulation tissue. No signs of surrounding infection. Good edema o control. Integumentary (Hair, Skin) Wound #1 status is Open. Original cause of wound was Gradually Appeared. The date acquired was: 07/20/2022. The wound has been in treatment 1 weeks. The wound is  located on the Left,Lateral Foot. The wound measures 0.4cm length x 0.7cm width x 0.1cm depth; 0.22cm^2 area and 0.022cm^3 volume. There is no tunneling or undermining noted. There is a medium amount of serosanguineous drainage noted. The wound margin is distinct with the outline attached to the wound base. There is small (1-33%) red, pink granulation within the wound bed. There is a large (67-100%) amount of necrotic tissue within the wound bed including Eschar and Adherent Slough. The periwound skin appearance exhibited: Dry/Scaly. The periwound skin appearance did not exhibit: Callus, Crepitus, Excoriation, Induration, Rash, Scarring, Maceration, Atrophie Blanche, Cyanosis, Ecchymosis, Hemosiderin Staining, Mottled, Pallor, Rubor, Erythema. Periwound temperature was noted as No Abnormality. The periwound has tenderness on palpation. Assessment Active Problems ICD-10 Non-pressure chronic ulcer of other part of left lower leg with fat layer exposed Chronic venous hypertension (idiopathic) with ulcer of left lower extremity End stage renal disease Sickle-cell disease without crisis Chronic obstructive pulmonary disease, unspecified Legal blindness, as defined in [place] Patient's wound has improved in size and appearance since last clinic visit. I debrided nonviable tissue. I recommended continuing the course with antibiotic ointment and Hydrofera Blue under Kerlix/Coban. Follow-up in 1 week. Patrick [name], GIUSTINO [name] ([id number]) [id number] Page 6 of 8 Procedures Wound #1 Pre-procedure diagnosis of Wound #1 is a  Venous Leg Ulcer located on the Left,Lateral Foot .Severity of Tissue Pre Debridement is: Fat layer exposed. There was a Selective/Open Wound Skin/Epidermis Debridement with a total area of 0.22 sq cm performed by [name], DO. With the following instrument(s): Curette to remove Viable and Non-Viable tissue/material. Material removed includes Slough, Skin:  Dermis, and Skin: Epidermis after achieving pain control using Lidocaine. No specimens were taken. A time out was conducted at 10:35, prior to the start of the procedure. A Minimum amount of bleeding was controlled with Pressure. The procedure was tolerated well with a pain level of 0 throughout and a pain level of 0 following the procedure. Post Debridement Measurements: 0.4cm length x 0.7cm width x 0.1cm depth; 0.022cm^3 volume. Character of Wound/Ulcer Post Debridement is improved. Severity of Tissue Post Debridement is: Fat layer exposed. Post procedure Diagnosis Wound #1: Same as Pre-Procedure Plan Follow-up Appointments: Return Appointment in 1 week. - w/ Dr. Mikey Bussing and Maryruth Bun Rm # 9 Thursday 10/25/22 @ 8:15 Anesthetic: (In clinic) Topical Lidocaine 5% applied to wound bed Bathing/ Shower/ Hygiene: May shower with protection but do not get wound dressing(s) wet. Protect dressing(s) with water repellant cover (for example, large plastic bag) or a cast cover and may then take shower. Edema Control - Lymphedema / SCD / Other: Elevate legs to the level of the heart or above for 30 minutes daily and/or when sitting for 3-4 times a day throughout the day. Avoid standing for long periods of time. WOUND #1: - Foot Wound Laterality: Left, Lateral Cleanser: Soap and Water 1 x Per Week/15 Days Discharge Instructions: May shower and wash wound with dial antibacterial soap and water prior to dressing change. Cleanser: Vashe 5.8 (oz) 1 x Per Week/15 Days Discharge Instructions: Cleanse the wound with Vashe prior to applying a clean dressing using gauze sponges, not tissue or cotton balls. Topical: Gentamicin 1 x Per Week/15 Days Discharge Instructions: As directed by physician Topical: Mupirocin Ointment 1 x Per Week/15 Days Discharge Instructions: Apply Mupirocin (Bactroban) as instructed Prim Dressing: Hydrofera Blue Ready Transfer Foam, 2.5x2.5 (in/in) 1 x Per Week/15 Days ary Discharge  Instructions: Apply directly to wound bed as directed Secondary Dressing: ABD Pad, 5x9 1 x Per Week/15 Days Discharge Instructions: Apply over primary dressing as directed. Com pression Wrap: Kerlix Roll 4.5x3.1 (in/yd) 1 x Per Week/15 Days Discharge Instructions: Apply Kerlix and Coban compression as directed. Com pression Wrap: Coban Self-Adherent Wrap 4x5 (in/yd) 1 x Per Week/15 Days Discharge Instructions: Apply over Kerlix as directed. 1. In office sharp debridement 2. Hydrofera Blue and antibiotic ointment under Kerlix/Cobanleft lower extremity 3. Follow-up in 1 week Electronic Signature(s) Signed: 12/17/2022 2:03:10 PM By: Pearletha Alfred Signed: 12/17/2022 2:57:08 PM By: [name] DO Previous Signature: 10/19/2022 11:50:33 AM Version By: [name] DO Entered By: Pearletha Alfred on [name]/06/2022 14:03:10 -------------------------------------------------------------------------------- HxROS Details Patient Name: Date of Service: [facility], [name] 10/19/2022 10:15 A M Medical Record Number: [id number] Patient Account Number: [id number] Date of Birth/Sex: Treating RN: May [birth year], Patrick [name] (67 y.o. M) Primary Care Provider: Jeanann Lewandowsky Other Clinician: Referring Provider: Treating Provider/Extender: Glade Nurse Weeks in Treatment: 1 Information Obtained From Patient Chart Hematologic/Lymphatic DAMARIO, WYFFELS [name] ([id number]) [id number] Page 7 of 8 Medical History: Positive for: Sickle Cell Disease Cardiovascular Medical History: Positive for: Arrhythmia - A fib; Congestive Heart Failure Negative for: Hypertension Past Medical History Notes: mitral regurgitation; tricuspid regurgitation!!! Musculoskeletal Medical History: Positive for: Gout Immunizations Implantable Devices Yes Family and Social History Cancer: No; Diabetes:  No; Heart Disease: Yes - Mother; Hereditary Spherocytosis: No; Hypertension: No; Kidney  Disease: No; Lung Disease: No; Seizures: No; Stroke: No; Thyroid Problems: No; Tuberculosis: No; Former smoker; Alcohol Use: Never; Drug Use: No History; Caffeine Use: Rarely; Financial Concerns: No; Food, Clothing or Shelter Needs: No; Support System Lacking: No; Transportation Concerns: No Electronic Signature(s) Signed: 10/19/2022 11:50:33 AM By: [name] DO Entered By: [name] on 10/19/2022 10:50:49 -------------------------------------------------------------------------------- SuperBill Details Patient Name: Date of Service: [facility], [name] 10/19/2022 Medical Record Number: [id number] Patient Account Number: [id number] Date of Birth/Sex: Treating RN: [birth year] [name] (67 y.o. Patrick [name], Patrick [name] Primary Care Provider: Jeanann Lewandowsky Other Clinician: Referring Provider: Treating Provider/Extender: Glade Nurse Weeks in Treatment: 1 Diagnosis Coding ICD-10 Codes Code Description [phone] Non-pressure chronic ulcer of other part of left lower leg with fat layer exposed I87.312 Chronic venous hypertension (idiopathic) with ulcer of left lower extremity N18.6 End stage renal disease D57.1 Sickle-cell disease without crisis J44.9 Chronic obstructive pulmonary disease, unspecified H54.8 Legal blindness, as defined in [place] Facility Procedures : CPT4 Code: 08657846 Description: [id number] - DEBRIDE WOUND 1ST 20 SQ CM OR < ICD-10 Diagnosis Description L97.822 Non-pressure chronic ulcer of other part of left lower leg with fat layer expose I87.312 Chronic venous hypertension (idiopathic) with ulcer of left lower extremity Modifier: [name] Quantity: 1 Physician Procedures : CPT4 Code Description Modifier [id number] - WC PHYS DEBR WO ANESTH 20 SQ CM ICD-10 Diagnosis Description LYMAN, GUIMOND [name] ([id number]) [id number] L97.822 Non-pressure chronic ulcer of other part of left lower leg with  fat layer exposed I87.312 Chronic  venous hypertension (idiopathic) with ulcer of left lower extremity Quantity: 1 f Page 8 of 8 Electronic Signature(s) Signed: 10/19/2022 11:50:33 AM By: [name] DO Entered By: [name] on 10/19/2022 10:52:16

## 2022-12-18 DIAGNOSIS — N2581 Secondary hyperparathyroidism of renal origin: Secondary | ICD-10-CM | POA: Diagnosis not present

## 2022-12-18 DIAGNOSIS — Z992 Dependence on renal dialysis: Secondary | ICD-10-CM | POA: Diagnosis not present

## 2022-12-18 DIAGNOSIS — D5700 Hb-SS disease with crisis, unspecified: Secondary | ICD-10-CM | POA: Diagnosis not present

## 2022-12-18 DIAGNOSIS — N186 End stage renal disease: Secondary | ICD-10-CM | POA: Diagnosis not present

## 2022-12-18 DIAGNOSIS — D571 Sickle-cell disease without crisis: Secondary | ICD-10-CM | POA: Diagnosis not present

## 2022-12-18 DIAGNOSIS — L299 Pruritus, unspecified: Secondary | ICD-10-CM | POA: Diagnosis not present

## 2022-12-18 DIAGNOSIS — D57219 Sickle-cell/Hb-C disease with crisis, unspecified: Secondary | ICD-10-CM | POA: Diagnosis not present

## 2022-12-18 DIAGNOSIS — D631 Anemia in chronic kidney disease: Secondary | ICD-10-CM | POA: Diagnosis not present

## 2022-12-18 DIAGNOSIS — D57819 Other sickle-cell disorders with crisis, unspecified: Secondary | ICD-10-CM | POA: Diagnosis not present

## 2022-12-22 ENCOUNTER — Other Ambulatory Visit: Payer: Self-pay | Admitting: Nurse Practitioner

## 2022-12-25 ENCOUNTER — Other Ambulatory Visit: Payer: Self-pay | Admitting: Family Medicine

## 2022-12-25 ENCOUNTER — Telehealth: Payer: Self-pay | Admitting: Internal Medicine

## 2022-12-25 DIAGNOSIS — D631 Anemia in chronic kidney disease: Secondary | ICD-10-CM | POA: Diagnosis not present

## 2022-12-25 DIAGNOSIS — L299 Pruritus, unspecified: Secondary | ICD-10-CM | POA: Diagnosis not present

## 2022-12-25 DIAGNOSIS — D5700 Hb-SS disease with crisis, unspecified: Secondary | ICD-10-CM | POA: Diagnosis not present

## 2022-12-25 DIAGNOSIS — D571 Sickle-cell disease without crisis: Secondary | ICD-10-CM

## 2022-12-25 DIAGNOSIS — N2581 Secondary hyperparathyroidism of renal origin: Secondary | ICD-10-CM | POA: Diagnosis not present

## 2022-12-25 DIAGNOSIS — N186 End stage renal disease: Secondary | ICD-10-CM | POA: Diagnosis not present

## 2022-12-25 DIAGNOSIS — Z992 Dependence on renal dialysis: Secondary | ICD-10-CM | POA: Diagnosis not present

## 2022-12-25 DIAGNOSIS — D57819 Other sickle-cell disorders with crisis, unspecified: Secondary | ICD-10-CM | POA: Diagnosis not present

## 2022-12-25 DIAGNOSIS — D57219 Sickle-cell/Hb-C disease with crisis, unspecified: Secondary | ICD-10-CM | POA: Diagnosis not present

## 2022-12-25 MED ORDER — OXYCODONE HCL 10 MG PO TABS
10.0000 mg | ORAL_TABLET | Freq: Four times a day (QID) | ORAL | 0 refills | Status: DC | PRN
Start: 2022-12-25 — End: 2023-01-14

## 2022-12-25 NOTE — Progress Notes (Signed)
Reviewed PDMP substance reporting system prior to prescribing opiate medications. No inconsistencies noted.  Meds ordered this encounter  Medications   Oxycodone HCl 10 MG TABS    Sig: Take 1 tablet (10 mg total) by mouth every 6 (six) hours as needed.    Dispense:  60 tablet    Refill:  0    Order Specific Question:   Supervising Provider    Answer:   JEGEDE, OLUGBEMIGA E [[id number]]   Rhylin Venters Moore Floella Ensz  APRN, MSN, FNP-C [facility] [address]  [place], [place] [address] [phone]  

## 2022-12-25 NOTE — Telephone Encounter (Signed)
Caller & Relationship to patient:  MRN #  [id number]   Call Back Number:   Date of Last Office Visit: 12/22/2022     Date of Next Office Visit: 02/12/2023    Medication(s) to be Refilled: Oxycodone   Preferred Pharmacy:   ** Please notify patient to allow 48-72 hours to process** **Let patient know to contact pharmacy at the end of the day to make sure medication is ready. ** **If patient has not been seen in a year or longer, book an appointment **Advise to use MyChart for refill requests OR to contact their pharmacy

## 2023-01-01 DIAGNOSIS — Z992 Dependence on renal dialysis: Secondary | ICD-10-CM | POA: Diagnosis not present

## 2023-01-01 DIAGNOSIS — L299 Pruritus, unspecified: Secondary | ICD-10-CM | POA: Diagnosis not present

## 2023-01-01 DIAGNOSIS — D5700 Hb-SS disease with crisis, unspecified: Secondary | ICD-10-CM | POA: Diagnosis not present

## 2023-01-01 DIAGNOSIS — N186 End stage renal disease: Secondary | ICD-10-CM | POA: Diagnosis not present

## 2023-01-01 DIAGNOSIS — N2581 Secondary hyperparathyroidism of renal origin: Secondary | ICD-10-CM | POA: Diagnosis not present

## 2023-01-01 DIAGNOSIS — D509 Iron deficiency anemia, unspecified: Secondary | ICD-10-CM | POA: Diagnosis not present

## 2023-01-01 DIAGNOSIS — D571 Sickle-cell disease without crisis: Secondary | ICD-10-CM | POA: Diagnosis not present

## 2023-01-01 DIAGNOSIS — D57819 Other sickle-cell disorders with crisis, unspecified: Secondary | ICD-10-CM | POA: Diagnosis not present

## 2023-01-01 DIAGNOSIS — D631 Anemia in chronic kidney disease: Secondary | ICD-10-CM | POA: Diagnosis not present

## 2023-01-01 DIAGNOSIS — D57219 Sickle-cell/Hb-C disease with crisis, unspecified: Secondary | ICD-10-CM | POA: Diagnosis not present

## 2023-01-04 ENCOUNTER — Ambulatory Visit: Payer: Self-pay

## 2023-01-04 NOTE — Patient Outreach (Signed)
  Care Coordination   Follow Up Visit Note   01/04/2023 Name: QUANTAVIUS HUMM MRN: [id number] DOB: 09/30/1955  Ahmadou D Brinkley is a 67 y.o. year old male who sees Quentin Angst, MD for primary care. I spoke with  Isami D Surges by phone today.  What matters to the patients health and wellness today?  Mr. Willette reports he is doing well. He reports has followed up with PCP and denies any questions or concerns. H states he is back at work. Denies any care coordination, resource or disease management needs at this time. Patient to contact RNCM if care coordination needs in the future.  Goals Addressed             This Visit's Progress    COMPLETED: assist with health management       Interventions Today    Flowsheet Row Most Recent Value  Chronic Disease   Chronic disease during today's visit Other  [wound to ankle]  General Interventions   General Interventions Discussed/Reviewed General Interventions Reviewed  Education Interventions   Education Provided Provided Education  Provided Verbal Education On Other  [provided contact number to wound clinic. advised patient to contact provider to get clarification on when he can go back to work and any restrictions as well as recommendation for pain management.]  Pharmacy Interventions   Pharmacy Dicussed/Reviewed Pharmacy Topics Reviewed  [patient denies any questions or concerns regarding medications]            SDOH assessments and interventions completed:  No  Care Coordination Interventions:  Yes, provided   Follow up plan: No further intervention required.   Encounter Outcome:  Pt. Visit Completed   Kathyrn Sheriff, RN, MSN, BSN, CCM [facility] Care Coordinator [phone]

## 2023-01-04 NOTE — Patient Instructions (Signed)
Visit Information  Thank you for taking time to visit with me today. Please don't hesitate to contact me if I can be of assistance to you.   Following are the goals we discussed today:  Attend provider visits as scheduled Take medications as prescribed Contact your provider with health questions or concerns  If you are experiencing a Mental Health or Behavioral Health Crisis or need someone to talk to, please call the Suicide and Crisis Lifeline: 9  Kathyrn Sheriff, RN, MSN, BSN, CCM [facility] Care Coordinator [phone]

## 2023-01-08 ENCOUNTER — Other Ambulatory Visit: Payer: Self-pay | Admitting: Family Medicine

## 2023-01-08 DIAGNOSIS — D571 Sickle-cell disease without crisis: Secondary | ICD-10-CM | POA: Diagnosis not present

## 2023-01-08 DIAGNOSIS — D57219 Sickle-cell/Hb-C disease with crisis, unspecified: Secondary | ICD-10-CM | POA: Diagnosis not present

## 2023-01-08 DIAGNOSIS — D631 Anemia in chronic kidney disease: Secondary | ICD-10-CM | POA: Diagnosis not present

## 2023-01-08 DIAGNOSIS — N186 End stage renal disease: Secondary | ICD-10-CM | POA: Diagnosis not present

## 2023-01-08 DIAGNOSIS — D57819 Other sickle-cell disorders with crisis, unspecified: Secondary | ICD-10-CM | POA: Diagnosis not present

## 2023-01-08 DIAGNOSIS — N2581 Secondary hyperparathyroidism of renal origin: Secondary | ICD-10-CM | POA: Diagnosis not present

## 2023-01-08 DIAGNOSIS — L299 Pruritus, unspecified: Secondary | ICD-10-CM | POA: Diagnosis not present

## 2023-01-08 DIAGNOSIS — Z992 Dependence on renal dialysis: Secondary | ICD-10-CM | POA: Diagnosis not present

## 2023-01-08 DIAGNOSIS — D5700 Hb-SS disease with crisis, unspecified: Secondary | ICD-10-CM | POA: Diagnosis not present

## 2023-01-14 ENCOUNTER — Telehealth: Payer: Self-pay | Admitting: Internal Medicine

## 2023-01-14 ENCOUNTER — Other Ambulatory Visit: Payer: Self-pay | Admitting: Nurse Practitioner

## 2023-01-14 DIAGNOSIS — D571 Sickle-cell disease without crisis: Secondary | ICD-10-CM

## 2023-01-14 MED ORDER — OXYCODONE HCL 10 MG PO TABS
10.0000 mg | ORAL_TABLET | Freq: Four times a day (QID) | ORAL | 0 refills | Status: DC | PRN
Start: 2023-01-14 — End: 2023-03-12

## 2023-01-14 NOTE — Telephone Encounter (Addendum)
Caller & Relationship to patient:  MRN #  [id number]   Call Back Number:   Date of Last Office Visit: 01/08/2023     Date of Next Office Visit: 02/12/2023    Medication(s) to be Refilled: Oxycodone   12 hr  6 hr   Preferred Pharmacy:   ** Please notify patient to allow 48-72 hours to process** **Let patient know to contact pharmacy at the end of the day to make sure medication is ready. ** **If patient has not been seen in a year or longer, book an appointment **Advise to use MyChart for refill requests OR to contact their pharmacy

## 2023-01-15 DIAGNOSIS — D631 Anemia in chronic kidney disease: Secondary | ICD-10-CM | POA: Diagnosis not present

## 2023-01-15 DIAGNOSIS — N186 End stage renal disease: Secondary | ICD-10-CM | POA: Diagnosis not present

## 2023-01-15 DIAGNOSIS — D5700 Hb-SS disease with crisis, unspecified: Secondary | ICD-10-CM | POA: Diagnosis not present

## 2023-01-15 DIAGNOSIS — D571 Sickle-cell disease without crisis: Secondary | ICD-10-CM | POA: Diagnosis not present

## 2023-01-15 DIAGNOSIS — L299 Pruritus, unspecified: Secondary | ICD-10-CM | POA: Diagnosis not present

## 2023-01-15 DIAGNOSIS — N2581 Secondary hyperparathyroidism of renal origin: Secondary | ICD-10-CM | POA: Diagnosis not present

## 2023-01-15 DIAGNOSIS — D57219 Sickle-cell/Hb-C disease with crisis, unspecified: Secondary | ICD-10-CM | POA: Diagnosis not present

## 2023-01-15 DIAGNOSIS — D57819 Other sickle-cell disorders with crisis, unspecified: Secondary | ICD-10-CM | POA: Diagnosis not present

## 2023-01-15 DIAGNOSIS — Z992 Dependence on renal dialysis: Secondary | ICD-10-CM | POA: Diagnosis not present

## 2023-01-16 DIAGNOSIS — I158 Other secondary hypertension: Secondary | ICD-10-CM | POA: Diagnosis not present

## 2023-01-16 DIAGNOSIS — Z992 Dependence on renal dialysis: Secondary | ICD-10-CM | POA: Diagnosis not present

## 2023-01-16 DIAGNOSIS — N186 End stage renal disease: Secondary | ICD-10-CM | POA: Diagnosis not present

## 2023-01-17 DIAGNOSIS — D571 Sickle-cell disease without crisis: Secondary | ICD-10-CM | POA: Diagnosis not present

## 2023-01-17 DIAGNOSIS — H25813 Combined forms of age-related cataract, bilateral: Secondary | ICD-10-CM | POA: Diagnosis not present

## 2023-01-19 DIAGNOSIS — Z992 Dependence on renal dialysis: Secondary | ICD-10-CM | POA: Diagnosis not present

## 2023-01-19 DIAGNOSIS — D57219 Sickle-cell/Hb-C disease with crisis, unspecified: Secondary | ICD-10-CM | POA: Diagnosis not present

## 2023-01-19 DIAGNOSIS — D57819 Other sickle-cell disorders with crisis, unspecified: Secondary | ICD-10-CM | POA: Diagnosis not present

## 2023-01-19 DIAGNOSIS — D509 Iron deficiency anemia, unspecified: Secondary | ICD-10-CM | POA: Diagnosis not present

## 2023-01-19 DIAGNOSIS — D5700 Hb-SS disease with crisis, unspecified: Secondary | ICD-10-CM | POA: Diagnosis not present

## 2023-01-19 DIAGNOSIS — N2581 Secondary hyperparathyroidism of renal origin: Secondary | ICD-10-CM | POA: Diagnosis not present

## 2023-01-19 DIAGNOSIS — L299 Pruritus, unspecified: Secondary | ICD-10-CM | POA: Diagnosis not present

## 2023-01-19 DIAGNOSIS — N186 End stage renal disease: Secondary | ICD-10-CM | POA: Diagnosis not present

## 2023-01-19 DIAGNOSIS — D631 Anemia in chronic kidney disease: Secondary | ICD-10-CM | POA: Diagnosis not present

## 2023-01-19 DIAGNOSIS — D571 Sickle-cell disease without crisis: Secondary | ICD-10-CM | POA: Diagnosis not present

## 2023-01-21 ENCOUNTER — Telehealth: Payer: Self-pay | Admitting: Internal Medicine

## 2023-01-21 ENCOUNTER — Other Ambulatory Visit: Payer: Self-pay | Admitting: Family Medicine

## 2023-01-21 DIAGNOSIS — D571 Sickle-cell disease without crisis: Secondary | ICD-10-CM

## 2023-01-21 DIAGNOSIS — G894 Chronic pain syndrome: Secondary | ICD-10-CM

## 2023-01-21 MED ORDER — OXYCODONE HCL ER 10 MG PO T12A
10.0000 mg | EXTENDED_RELEASE_TABLET | Freq: Two times a day (BID) | ORAL | 0 refills | Status: DC
Start: 2023-01-21 — End: 2023-03-25

## 2023-01-21 NOTE — Progress Notes (Signed)
Reviewed PDMP substance reporting system prior to prescribing opiate medications. No inconsistencies noted.  Meds ordered this encounter  Medications   oxyCODONE (OXYCONTIN) 10 mg 12 hr tablet    Sig: Take 1 tablet (10 mg total) by mouth every 12 (twelve) hours.    Dispense:  30 tablet    Refill:  0    Order Specific Question:   Supervising Provider    Answer:   Quentin Angst [[id number]]   Nolon Nations  APRN, MSN, FNP-C Patient Care [facility] [address] [place], [place] [address] [phone]

## 2023-01-21 NOTE — Telephone Encounter (Signed)
Caller & Relationship to patient:  MRN #  [id number]   Call Back Number:   Date of Last Office Visit: 01/14/2023     Date of Next Office Visit: 02/12/2023    Medication(s) to be Refilled: 12 hr pain med   Preferred Pharmacy:   ** Please notify patient to allow 48-72 hours to process** **Let patient know to contact pharmacy at the end of the day to make sure medication is ready. ** **If patient has not been seen in a year or longer, book an appointment **Advise to use MyChart for refill requests OR to contact their pharmacy

## 2023-01-22 DIAGNOSIS — D57819 Other sickle-cell disorders with crisis, unspecified: Secondary | ICD-10-CM | POA: Diagnosis not present

## 2023-01-22 DIAGNOSIS — Z992 Dependence on renal dialysis: Secondary | ICD-10-CM | POA: Diagnosis not present

## 2023-01-22 DIAGNOSIS — D5700 Hb-SS disease with crisis, unspecified: Secondary | ICD-10-CM | POA: Diagnosis not present

## 2023-01-22 DIAGNOSIS — D571 Sickle-cell disease without crisis: Secondary | ICD-10-CM | POA: Diagnosis not present

## 2023-01-22 DIAGNOSIS — N186 End stage renal disease: Secondary | ICD-10-CM | POA: Diagnosis not present

## 2023-01-22 DIAGNOSIS — N2581 Secondary hyperparathyroidism of renal origin: Secondary | ICD-10-CM | POA: Diagnosis not present

## 2023-01-22 DIAGNOSIS — D57219 Sickle-cell/Hb-C disease with crisis, unspecified: Secondary | ICD-10-CM | POA: Diagnosis not present

## 2023-01-22 DIAGNOSIS — L299 Pruritus, unspecified: Secondary | ICD-10-CM | POA: Diagnosis not present

## 2023-01-22 DIAGNOSIS — D631 Anemia in chronic kidney disease: Secondary | ICD-10-CM | POA: Diagnosis not present

## 2023-01-23 ENCOUNTER — Other Ambulatory Visit: Payer: Self-pay

## 2023-01-23 ENCOUNTER — Telehealth: Payer: Self-pay

## 2023-01-23 ENCOUNTER — Encounter (HOSPITAL_COMMUNITY): Payer: Self-pay

## 2023-01-23 DIAGNOSIS — G894 Chronic pain syndrome: Secondary | ICD-10-CM

## 2023-01-23 DIAGNOSIS — D571 Sickle-cell disease without crisis: Secondary | ICD-10-CM

## 2023-01-23 NOTE — Telephone Encounter (Signed)
A PRIOR AUTHORIZATION FOR OXYCONTIN HAS BEEN SUBMITTED TO INSURANCE TODAY FOR EXPEDITED REVIEW VIA [employer] Key: [id number]

## 2023-01-23 NOTE — Telephone Encounter (Signed)
Just FYI. KH

## 2023-01-23 NOTE — Telephone Encounter (Signed)
Approved until 06/18/2023. [facility] has been notified and were able to successfully process prescription through insurance. Left a voicemail on patient's mobile #

## 2023-01-23 NOTE — Telephone Encounter (Signed)
Please advise if you have a PA for this medication. Thank you. KH

## 2023-01-23 NOTE — Telephone Encounter (Signed)
Ok . Thank you. kH

## 2023-01-24 NOTE — Telephone Encounter (Signed)
Thank you. Kh

## 2023-01-28 ENCOUNTER — Other Ambulatory Visit: Payer: Self-pay | Admitting: Family Medicine

## 2023-01-28 ENCOUNTER — Telehealth: Payer: Self-pay

## 2023-01-28 DIAGNOSIS — G8929 Other chronic pain: Secondary | ICD-10-CM

## 2023-01-28 DIAGNOSIS — H25813 Combined forms of age-related cataract, bilateral: Secondary | ICD-10-CM | POA: Diagnosis not present

## 2023-01-28 DIAGNOSIS — H35053 Retinal neovascularization, unspecified, bilateral: Secondary | ICD-10-CM | POA: Diagnosis not present

## 2023-01-28 DIAGNOSIS — D571 Sickle-cell disease without crisis: Secondary | ICD-10-CM | POA: Diagnosis not present

## 2023-01-28 DIAGNOSIS — H2513 Age-related nuclear cataract, bilateral: Secondary | ICD-10-CM | POA: Diagnosis not present

## 2023-01-28 DIAGNOSIS — H3523 Other non-diabetic proliferative retinopathy, bilateral: Secondary | ICD-10-CM | POA: Diagnosis not present

## 2023-01-28 NOTE — Progress Notes (Signed)
Orders Placed This Encounter  Procedures   Ambulatory referral to Orthopedics    Referral Priority:   Routine    Referral Type:   Consultation    Number of Visits Requested:   1      Rennis Petty  APRN, MSN, FNP-C Patient Care [facility] [address] [place], [place] [address] [phone]

## 2023-01-28 NOTE — Telephone Encounter (Signed)
Patient is wanting a referral to ortho for his right knee pain. He was seen in 11/2022 for knee pain that continues. Please advise.

## 2023-01-28 NOTE — Telephone Encounter (Signed)
Was sent in 01/21/23

## 2023-01-29 DIAGNOSIS — N2581 Secondary hyperparathyroidism of renal origin: Secondary | ICD-10-CM | POA: Diagnosis not present

## 2023-01-29 DIAGNOSIS — Z992 Dependence on renal dialysis: Secondary | ICD-10-CM | POA: Diagnosis not present

## 2023-01-29 DIAGNOSIS — N186 End stage renal disease: Secondary | ICD-10-CM | POA: Diagnosis not present

## 2023-01-29 DIAGNOSIS — D57219 Sickle-cell/Hb-C disease with crisis, unspecified: Secondary | ICD-10-CM | POA: Diagnosis not present

## 2023-01-29 DIAGNOSIS — D5700 Hb-SS disease with crisis, unspecified: Secondary | ICD-10-CM | POA: Diagnosis not present

## 2023-01-29 DIAGNOSIS — D571 Sickle-cell disease without crisis: Secondary | ICD-10-CM | POA: Diagnosis not present

## 2023-01-29 DIAGNOSIS — D631 Anemia in chronic kidney disease: Secondary | ICD-10-CM | POA: Diagnosis not present

## 2023-01-29 DIAGNOSIS — D57819 Other sickle-cell disorders with crisis, unspecified: Secondary | ICD-10-CM | POA: Diagnosis not present

## 2023-01-31 NOTE — Telephone Encounter (Signed)
Order placed

## 2023-02-01 DIAGNOSIS — H25811 Combined forms of age-related cataract, right eye: Secondary | ICD-10-CM | POA: Diagnosis not present

## 2023-02-05 ENCOUNTER — Encounter (HOSPITAL_COMMUNITY): Payer: Self-pay

## 2023-02-05 ENCOUNTER — Ambulatory Visit (HOSPITAL_COMMUNITY)
Admission: RE | Admit: 2023-02-05 | Discharge: 2023-02-05 | Disposition: A | Discharge status: Home / Self Care | Payer: PPO | Source: Ambulatory Visit | Attending: Family Medicine | Admitting: Family Medicine

## 2023-02-05 DIAGNOSIS — D57819 Other sickle-cell disorders with crisis, unspecified: Secondary | ICD-10-CM | POA: Diagnosis not present

## 2023-02-05 DIAGNOSIS — N2581 Secondary hyperparathyroidism of renal origin: Secondary | ICD-10-CM | POA: Diagnosis not present

## 2023-02-05 DIAGNOSIS — R1084 Generalized abdominal pain: Secondary | ICD-10-CM | POA: Insufficient documentation

## 2023-02-05 DIAGNOSIS — D5700 Hb-SS disease with crisis, unspecified: Secondary | ICD-10-CM | POA: Diagnosis not present

## 2023-02-05 DIAGNOSIS — Z992 Dependence on renal dialysis: Secondary | ICD-10-CM | POA: Diagnosis not present

## 2023-02-05 DIAGNOSIS — R109 Unspecified abdominal pain: Secondary | ICD-10-CM | POA: Diagnosis not present

## 2023-02-05 DIAGNOSIS — D509 Iron deficiency anemia, unspecified: Secondary | ICD-10-CM | POA: Diagnosis not present

## 2023-02-05 DIAGNOSIS — R188 Other ascites: Secondary | ICD-10-CM | POA: Diagnosis not present

## 2023-02-05 DIAGNOSIS — D631 Anemia in chronic kidney disease: Secondary | ICD-10-CM | POA: Diagnosis not present

## 2023-02-05 DIAGNOSIS — N186 End stage renal disease: Secondary | ICD-10-CM | POA: Diagnosis not present

## 2023-02-05 DIAGNOSIS — D57219 Sickle-cell/Hb-C disease with crisis, unspecified: Secondary | ICD-10-CM | POA: Diagnosis not present

## 2023-02-05 DIAGNOSIS — D571 Sickle-cell disease without crisis: Secondary | ICD-10-CM | POA: Diagnosis not present

## 2023-02-06 ENCOUNTER — Ambulatory Visit: Payer: PPO | Admitting: Physician Assistant

## 2023-02-06 ENCOUNTER — Encounter: Payer: Self-pay | Admitting: Physician Assistant

## 2023-02-06 DIAGNOSIS — G8929 Other chronic pain: Secondary | ICD-10-CM

## 2023-02-06 DIAGNOSIS — M1711 Unilateral primary osteoarthritis, right knee: Secondary | ICD-10-CM | POA: Diagnosis not present

## 2023-02-06 DIAGNOSIS — M25561 Pain in right knee: Secondary | ICD-10-CM | POA: Insufficient documentation

## 2023-02-06 NOTE — Progress Notes (Signed)
Office Visit Note   Patient: Patrick [name]           Date of Birth: Oct 01, 1955           MRN: [id number] Visit Date: 02/06/2023              Requested by: Massie Maroon, FNP [address] [place],  [place] [address] PCP: Quentin Angst, MD  Chief Complaint  Patient presents with   Right Knee - Pain      HPI: Patient is a pleasant 67 year old gentleman with a 1 year history of right knee pain.  Denies any injuries.  Pain is over the anterior aspect of the knee.  He has tried [employer] and a knee brace does not feel like it has been helpful no surgeries.  He has not had injections or physical therapy unfortunately he cannot take nonsteroidal anti-inflammatories because he is on dialysis.  Dialysis was caused by Lasix chronic use.  Does not have a history of diabetes.  Rates his pain as moderate  Assessment & Plan: Visit Diagnoses: Osteoarthritis right knee  Plan: Had a long discussion with the patient.  He certainly has some limited options.  He does have an effusion but no infection today.  I think he would do well with an aspiration and injection but he does not want to do that because he gets a lot of needles secondary to his dialysis.  We also discussed physical therapy which she will declined at this time.  He is got quite a bit of quad atrophy and I do think this would help him as well.  He may contact me at any time if he wishes to revisit either of these issues in the meantime he will continue to use Tylenol  Follow-Up Instructions: As needed  Ortho Exam  Patient is alert, oriented, no adenopathy, well-dressed, normal affect, normal respiratory effort. Right knee he has moderate bilateral quad atrophy though worse on the right than the left.  No redness no erythema compartments are soft and nontender he does have a moderate effusion.  He can sustain a straight leg raise good dorsiflexion plantarflexion extension and flexion of his legs x-rays done in May  demonstrate tricompartmental arthritis  Imaging: No results found. No images are attached to the encounter.  Labs: Lab Results  Component Value Date   HGBA1C 4.9 05/25/2021   HGBA1C <4.0 06/07/2011   LABURIC 5.8 05/31/2017   LABURIC 8.4 (H) 05/26/2014   LABURIC 12.0 (H) 09/17/2013   REPTSTATUS 07/25/2022 FINAL 07/25/2022   GRAMSTAIN  07/04/2022    WBC PRESENT, PREDOMINANTLY MONONUCLEAR NO ORGANISMS SEEN CYTOSPIN SMEAR Performed at [facility], [address] [place], [place] [address]    CULT  07/24/2022    NO GROWTH 5 DAYS Performed at [facility], [address], [place], [place] [address]    [facility] ESCHERICHIA COLI 08/06/2021     Lab Results  Component Value Date   ALBUMIN 2.9 (L) 11/27/2022   ALBUMIN 3.0 (L) 11/26/2022   ALBUMIN 2.9 (L) 11/24/2022   PREALBUMIN 18 07/24/2022    Lab Results  Component Value Date   MG 2.1 10/21/2022   MG 2.1 07/24/2022   MG 2.1 07/24/2022   Lab Results  Component Value Date   VD25OH 96.3 11/28/2021   VD25OH 13.7 (L) 05/24/2020   VD25OH 11.4 (L) 12/29/2019    Lab Results  Component Value Date   PREALBUMIN 18 07/24/2022  Latest Ref Rng & Units 12/04/2022   11:16 AM 11/27/2022    7:45 AM 11/26/2022    3:50 AM  CBC EXTENDED  WBC 3.4 - 10.8 x10E3/uL 3.6  4.6  5.1   RBC 4.14 - 5.80 x10E6/uL 2.58  2.34  2.51    2.49   Hemoglobin 13.0 - 17.7 g/dL 8.9  8.3  8.7   HCT 10.2 - 51.0 % 25.1  23.5  25.1   Platelets 150 - 450 x10E3/uL 128  122  127   NEUT# 1.4 - 7.0 x10E3/uL 2.3     Lymph# 0.7 - 3.1 x10E3/uL 0.5        There is no height or weight on file to calculate BMI.  Orders:  No orders of the defined types were placed in this encounter.  No orders of the defined types were placed in this encounter.    Procedures: No procedures performed  Clinical Data: No additional findings.  ROS:  All other systems negative, except as noted in the HPI. Review of Systems  Objective: Vital Signs:  There were no vitals taken for this visit.  Specialty Comments:  No specialty comments available.  PMFS History: Patient Active Problem List   Diagnosis Date Noted   Hospital discharge follow-up 12/04/2022   Sickle cell pain crisis (HCC) 11/21/2022   GI bleed 11/20/2022   Cirrhosis (HCC) 10/21/2022   Chronic hypotension 10/21/2022   Thrombocytopenia (HCC) 10/21/2022   Wound infection 10/02/2022   Legally blind 10/02/2022   Atrial fibrillation with RVR (HCC) 07/23/2022   Hypoglycemia 07/23/2022   Cystic lesion of abdominal viscera 04/06/2022   Bile leak    Cholelithiasis 11/30/2021   Chronic pain syndrome    Persistent atrial fibrillation (HCC) 09/13/2021   Symptomatic anemia 09/13/2021   Abdominal fluid collection 08/22/2021   Gastric AVM    Hiatal hernia    History of GI bleed 06/27/2021   Visual impairment 06/27/2021   COPD (chronic obstructive pulmonary disease) (HCC) 06/27/2021   Acute gangrenous cholecystitis s/p open cholcystectomy 05/24/2021 05/27/2021   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 05/24/2021 05/27/2021   Protein-calorie malnutrition, moderate (HCC) 05/23/2021   Prolonged QT interval 08/02/2020   Chronic anticoagulation    Tobacco dependence 05/28/2020   Chronic systolic CHF (congestive heart failure) (HCC) 09/11/2019   Encounter for therapeutic drug monitoring 12/29/2015   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   Permanent atrial fibrillation (HCC) 12/24/2015   ESRD on hemodialysis (HCC) 05/06/2014   Vitamin D deficiency 09/25/2013   CKD (chronic kidney disease), stage IV (HCC) 09/25/2013   Onychomycosis of toenail 08/27/2013   Sickle cell disease (HCC) 11/21/2012   Sickle cell crisis (HCC) 01/17/2012   NSVT (nonsustained ventricular tachycardia) (HCC) 10/21/2011   Gout 10/19/2011   Past Medical History:  Diagnosis Date   A-fib (HCC)    Blood dyscrasia    Blood transfusion    "I've had a bunch of them"   CHF (congestive heart failure) (HCC)     followed by hf clinic   Chronic kidney disease    Stage 5, dialysis on Tu/Th/Sa   Dialysis patient (HCC) 04/2019   AV fistula (right arm), TTS   Dysrhythmia    A-Fib per patient   Early satiety    Elevated LFTs 04/01/2022   Elevated troponin 04/01/2022   Empyema of gallbladder 05/27/2021   GERD (gastroesophageal reflux disease)    Gout    Legally blind    Metabolic acidosis with increased anion gap and accumulation  of organic acids 04/01/2022   Mitral regurgitation    Muscle tightness-Right arm  05/27/2014   Pneumonia    Pulmonary hypertension (HCC)    Shortness of breath dyspnea    Sickle cell disease, type SS (HCC)    Swelling abdomen    Swelling of extremity, left    Swelling of extremity, right    Tricuspid regurgitation     Family History  Problem Relation Age of Onset   Alcohol abuse Mother    Liver disease Mother    Cirrhosis Mother    Heart attack Mother     Past Surgical History:  Procedure Laterality Date   A/V FISTULAGRAM Right 07/29/2020   Procedure: A/V FISTULAGRAM - Right Arm;  Surgeon: Chuck Hint, MD;  Location: [facility] INVASIVE CV LAB;  Service: Cardiovascular;  Laterality: Right;   BASCILIC VEIN TRANSPOSITION Right 04/12/2014   Procedure: BASILIC VEIN TRANSPOSITION;  Surgeon: Sherren Kerns, MD;  Location[facility] OR;  Service: Vascular;  Laterality: Right;   BILIARY STENT PLACEMENT  04/04/2022   Procedure: BILIARY STENT PLACEMENT;  Surgeon: Meryl Dare, MD;  Location: [facility] ENDOSCOPY;  Service: Gastroenterology;;   BIOPSY  08/04/2020   Procedure: BIOPSY;  Surgeon: Meryl Dare, MD;  Location: [facility] ENDOSCOPY;  Service: Gastroenterology;;   BIOPSY  06/28/2021   Procedure: BIOPSY;  Surgeon: Beverley Fiedler, MD;  Location: [facility] ENDOSCOPY;  Service: Gastroenterology;;   BIOPSY  04/02/2022   Procedure: BIOPSY;  Surgeon: Lemar Lofty., MD;  Location: [facility] ENDOSCOPY;  Service: Gastroenterology;;   BIOPSY  10/22/2022   Procedure: BIOPSY;   Surgeon: Beverley Fiedler, MD;  Location: [facility] ENDOSCOPY;  Service: Gastroenterology;;   BOWEL RESECTION N/A 05/24/2021   Procedure: SMALL BOWEL RESECTION;  Surgeon: Emelia Loron, MD;  Location: [facility] OR;  Service: General;  Laterality: N/A;   CARDIOVERSION N/A 06/05/2018   Procedure: CARDIOVERSION;  Surgeon: Dolores Patty, MD;  Location: [facility] ENDOSCOPY;  Service: Cardiovascular;  Laterality: N/A;   CARDIOVERSION N/A 10/06/2019   Procedure: CARDIOVERSION;  Surgeon: Dolores Patty, MD;  Location: [facility] ENDOSCOPY;  Service: Cardiovascular;  Laterality: N/A;   CHOLECYSTECTOMY N/A 05/24/2021   Procedure: OPEN CHOLECYSTECTOMY;  Surgeon: Emelia Loron, MD;  Location: [facility] OR;  Service: General;  Laterality: N/A;   COLONOSCOPY WITH PROPOFOL N/A 09/24/2017   Procedure: COLONOSCOPY WITH PROPOFOL;  Surgeon: Lynann Bologna, MD;  Location: [facility] ENDOSCOPY;  Service: Endoscopy;  Laterality: N/A;   ENDOSCOPIC RETROGRADE CHOLANGIOPANCREATOGRAPHY (ERCP) WITH PROPOFOL N/A 05/17/2022   Procedure: ENDOSCOPIC RETROGRADE CHOLANGIOPANCREATOGRAPHY (ERCP) WITH PROPOFOL;  Surgeon: Meridee Score Netty Starring., MD;  Location: [facility] ENDOSCOPY;  Service: Gastroenterology;  Laterality: N/A;   ERCP N/A 04/04/2022   Procedure: ENDOSCOPIC RETROGRADE CHOLANGIOPANCREATOGRAPHY (ERCP);  Surgeon: Meryl Dare, MD;  Location: [facility] ENDOSCOPY;  Service: Gastroenterology;  Laterality: N/A;   ESOPHAGOGASTRODUODENOSCOPY N/A 09/19/2017   Procedure: ESOPHAGOGASTRODUODENOSCOPY (EGD);  Surgeon: Lynann Bologna, MD;  Location: [facility] ENDOSCOPY;  Service: Endoscopy;  Laterality: N/A;   ESOPHAGOGASTRODUODENOSCOPY N/A 05/29/2021   Procedure: ESOPHAGOGASTRODUODENOSCOPY (EGD);  Surgeon: Rachael Fee, MD;  Location: [facility] ENDOSCOPY;  Service: Gastroenterology;  Laterality: N/A;   ESOPHAGOGASTRODUODENOSCOPY (EGD) WITH PROPOFOL N/A 08/04/2020   Procedure: ESOPHAGOGASTRODUODENOSCOPY (EGD) WITH PROPOFOL;  Surgeon: Meryl Dare, MD;  Location: [facility] ENDOSCOPY;   Service: Gastroenterology;  Laterality: N/A;   ESOPHAGOGASTRODUODENOSCOPY (EGD) WITH PROPOFOL N/A 06/28/2021   Procedure: ESOPHAGOGASTRODUODENOSCOPY (EGD) WITH PROPOFOL;  Surgeon: Beverley Fiedler, MD;  Location: [facility] ENDOSCOPY;  Service: Gastroenterology;  Laterality: N/A;   ESOPHAGOGASTRODUODENOSCOPY (EGD) WITH PROPOFOL N/A  08/10/2021   Procedure: ESOPHAGOGASTRODUODENOSCOPY (EGD) WITH PROPOFOL;  Surgeon: Lynann Bologna, MD;  Location: [facility] ENDOSCOPY;  Service: Gastroenterology;  Laterality: N/A;   ESOPHAGOGASTRODUODENOSCOPY (EGD) WITH PROPOFOL N/A 08/02/2021   Procedure: ESOPHAGOGASTRODUODENOSCOPY (EGD) WITH PROPOFOL;  Surgeon: Shellia Cleverly, DO;  Location: [facility] ENDOSCOPY;  Service: Gastroenterology;  Laterality: N/A;   ESOPHAGOGASTRODUODENOSCOPY (EGD) WITH PROPOFOL N/A 04/02/2022   Procedure: ESOPHAGOGASTRODUODENOSCOPY (EGD) WITH PROPOFOL;  Surgeon: Meridee Score Netty Starring., MD;  Location: [facility] ENDOSCOPY;  Service: Gastroenterology;  Laterality: N/A;   ESOPHAGOGASTRODUODENOSCOPY (EGD) WITH PROPOFOL N/A 05/17/2022   Procedure: ESOPHAGOGASTRODUODENOSCOPY (EGD) WITH PROPOFOL;  Surgeon: Meridee Score Netty Starring., MD;  Location: [facility] ENDOSCOPY;  Service: Gastroenterology;  Laterality: N/A;   ESOPHAGOGASTRODUODENOSCOPY (EGD) WITH PROPOFOL N/A 10/22/2022   Procedure: ESOPHAGOGASTRODUODENOSCOPY (EGD) WITH PROPOFOL;  Surgeon: Beverley Fiedler, MD;  Location: [facility] ENDOSCOPY;  Service: Gastroenterology;  Laterality: N/A;   EUS N/A 05/17/2022   Procedure: UPPER ENDOSCOPIC ULTRASOUND (EUS) RADIAL;  Surgeon: Lemar Lofty., MD;  Location: [facility] ENDOSCOPY;  Service: Gastroenterology;  Laterality: N/A;   FINE NEEDLE ASPIRATION  04/02/2022   Procedure: FINE NEEDLE ASPIRATION (FNA) LINEAR;  Surgeon: Lemar Lofty., MD;  Location: [facility] ENDOSCOPY;  Service: Gastroenterology;;   FINE NEEDLE ASPIRATION N/A 05/17/2022   Procedure: FINE NEEDLE ASPIRATION (FNA) LINEAR;  Surgeon: Lemar Lofty., MD;  Location: [facility]  ENDOS[facility];  Service: Gastroenterology;  Laterality: N/A;   HEMOSTASIS CLIP PLACEMENT  05/29/2021   Procedure: HEMOSTASIS CLIP PLACEMENT;  Surgeon: Rachael Fee, MD;  Location: [facility] ENDOSCOPY;  Service: Gastroenterology;;   HOT HEMOSTASIS N/A 05/29/2021   Procedure: HOT HEMOSTASIS (ARGON PLASMA COAGULATION/BICAP);  Surgeon: Rachael Fee, MD;  Location: [facility] ENDOSCOPY;  Service: Gastroenterology;  Laterality: N/A;   HOT HEMOSTASIS N/A 06/28/2021   Procedure: HOT HEMOSTASIS (ARGON PLASMA COAGULATION/BICAP);  Surgeon: Beverley Fiedler, MD;  Location: [facility] ENDOSCOPY;  Service: Gastroenterology;  Laterality: N/A;   HOT HEMOSTASIS N/A 08/10/2021   Procedure: HOT HEMOSTASIS (ARGON PLASMA COAGULATION/BICAP);  Surgeon: Lynann Bologna, MD;  Location: [facility] ENDOSCOPY;  Service: Gastroenterology;  Laterality: N/A;   HOT HEMOSTASIS N/A 08/02/2021   Procedure: HOT HEMOSTASIS (ARGON PLASMA COAGULATION/BICAP);  Surgeon: Shellia Cleverly, DO;  Location: [facility] ENDOSCOPY;  Service: Gastroenterology;  Laterality: N/A;   IR PARACENTESIS  07/04/2022   IR PERC TUN PERIT CATH WO PORT S&I /IMAG  05/23/2021   IR REMOVAL TUN CV CATH W/O FL  06/10/2021   IR US GUIDE VASC ACCESS RIGHT  05/23/2021   LAPAROSCOPY N/A 05/24/2021   Procedure: LAPAROSCOPY DIAGNOSTIC;  Surgeon: Emelia Loron, MD;  Location: [facility] OR;  Service: General;  Laterality: N/A;   LAPAROTOMY N/A 05/24/2021   Procedure: EXPLORATORY LAPAROTOMY;  Surgeon: Emelia Loron, MD;  Location: [facility] OR;  Service: General;  Laterality: N/A;   LEFT AND RIGHT HEART CATHETERIZATION WITH CORONARY ANGIOGRAM N/A 06/11/2011   Procedure: LEFT AND RIGHT HEART CATHETERIZATION WITH CORONARY ANGIOGRAM;  Surgeon: Laurey Morale, MD;  Location: [facility] CATH LAB;  Service: Cardiovascular;  Laterality: N/A;   PERIPHERAL VASCULAR BALLOON ANGIOPLASTY Right 07/29/2020   Procedure: PERIPHERAL VASCULAR BALLOON ANGIOPLASTY;  Surgeon: Chuck Hint, MD;  Location: [facility] INVASIVE CV LAB;   Service: Cardiovascular;  Laterality: Right;  arm fistula   REMOVAL OF STONES  05/17/2022   Procedure: REMOVAL OF STONES;  Surgeon: Meridee Score Netty Starring., MD;  Location: [facility] ENDOSCOPY;  Service: Gastroenterology;;   REVISON OF ARTERIOVENOUS FISTULA Right 08/08/2020   Procedure: ANEURYSM EXCISION OF RIGHT UPPER EXTREMITY ARTERIOVENOUS FISTULA;  Surgeon: Chuck Hint, MD;  Location: [facility] OR;  Service: Vascular;  Laterality: Right;   REVISON OF ARTERIOVENOUS FISTULA Right 03/21/2022   Procedure: EXCISION OF ULCERATED SKIN OF RIGHT ARTERIOVENOUS FISTULA REVISION;  Surgeon: Cephus Shelling, MD;  Location: [facility] OR;  Service: Vascular;  Laterality: Right;   REVISON OF ARTERIOVENOUS FISTULA Right 07/02/2022   Procedure: EXCISION OF ULCERS RIGHT ARTERIOVENOUS FISTULA;  Surgeon: Chuck Hint, MD;  Location: [facility] OR;  Service: Vascular;  Laterality: Right;   SCLEROTHERAPY  05/29/2021   Procedure: Susa Day;  Surgeon: Rachael Fee, MD;  Location: [facility] ENDOSCOPY;  Service: Gastroenterology;;   Dennison Mascot  04/04/2022   Procedure: SPHINCTEROTOMY;  Surgeon: Meryl Dare, MD;  Location: [facility] ENDOSCOPY;  Service: Gastroenterology;;   Francine Graven REMOVAL  05/29/2021   Procedure: STENT REMOVAL;  Surgeon: Rachael Fee, MD;  Location: [facility] ENDOSCOPY;  Service: Gastroenterology;;   Francine Graven REMOVAL  05/17/2022   Procedure: STENT REMOVAL;  Surgeon: Lemar Lofty., MD;  Location: [facility] ENDOSCOPY;  Service: Gastroenterology;;   TEE WITHOUT CARDIOVERSION N/A 12/27/2015   Procedure: TRANSESOPHAGEAL ECHOCARDIOGRAM (TEE);  Surgeon: Quintella Reichert, MD;  Location: [facility] ENDOSCOPY;  Service: Cardiovascular;  Laterality: N/A;   UPPER ESOPHAGEAL ENDOSCOPIC ULTRASOUND (EUS) N/A 04/02/2022   Procedure: UPPER ESOPHAGEAL ENDOSCOPIC ULTRASOUND (EUS);  Surgeon: Lemar Lofty., MD;  Location: [facility] ENDOSCOPY;  Service: Gastroenterology;  Laterality: N/A;   Social History   Occupational History    Occupation: disabled  Tobacco Use   Smoking status: Former    Current packs/day: 0.00    Average packs/day: 0.5 packs/day for 41.0 years (20.5 ttl pk-yrs)    Types: Cigarettes    Start date: 12/08/1974    Quit date: 12/08/2015    Years since quitting: 7.1   Smokeless tobacco: Never  Vaping Use   Vaping status: Never Used  Substance and Sexual Activity   Alcohol use: Not Currently    Alcohol/week: 3.0 standard drinks of alcohol    Types: 1 Glasses of wine, 2 Cans of beer per week    Comment: none since starting dialysis 06/2021   Drug use: No   Sexual activity: Not Currently

## 2023-02-12 ENCOUNTER — Ambulatory Visit: Payer: Self-pay | Admitting: Family Medicine

## 2023-02-12 DIAGNOSIS — N186 End stage renal disease: Secondary | ICD-10-CM | POA: Diagnosis not present

## 2023-02-12 DIAGNOSIS — Z992 Dependence on renal dialysis: Secondary | ICD-10-CM | POA: Diagnosis not present

## 2023-02-12 DIAGNOSIS — D5700 Hb-SS disease with crisis, unspecified: Secondary | ICD-10-CM | POA: Diagnosis not present

## 2023-02-12 DIAGNOSIS — D571 Sickle-cell disease without crisis: Secondary | ICD-10-CM | POA: Diagnosis not present

## 2023-02-12 DIAGNOSIS — D57219 Sickle-cell/Hb-C disease with crisis, unspecified: Secondary | ICD-10-CM | POA: Diagnosis not present

## 2023-02-12 DIAGNOSIS — D631 Anemia in chronic kidney disease: Secondary | ICD-10-CM | POA: Diagnosis not present

## 2023-02-12 DIAGNOSIS — N2581 Secondary hyperparathyroidism of renal origin: Secondary | ICD-10-CM | POA: Diagnosis not present

## 2023-02-12 DIAGNOSIS — D57819 Other sickle-cell disorders with crisis, unspecified: Secondary | ICD-10-CM | POA: Diagnosis not present

## 2023-02-16 DIAGNOSIS — N186 End stage renal disease: Secondary | ICD-10-CM | POA: Diagnosis not present

## 2023-02-16 DIAGNOSIS — I158 Other secondary hypertension: Secondary | ICD-10-CM | POA: Diagnosis not present

## 2023-02-16 DIAGNOSIS — Z992 Dependence on renal dialysis: Secondary | ICD-10-CM | POA: Diagnosis not present

## 2023-02-19 DIAGNOSIS — M1711 Unilateral primary osteoarthritis, right knee: Secondary | ICD-10-CM | POA: Diagnosis not present

## 2023-02-19 DIAGNOSIS — Z515 Encounter for palliative care: Secondary | ICD-10-CM | POA: Diagnosis not present

## 2023-02-19 DIAGNOSIS — D5700 Hb-SS disease with crisis, unspecified: Secondary | ICD-10-CM | POA: Diagnosis not present

## 2023-02-19 DIAGNOSIS — N186 End stage renal disease: Secondary | ICD-10-CM | POA: Diagnosis not present

## 2023-02-19 DIAGNOSIS — N2581 Secondary hyperparathyroidism of renal origin: Secondary | ICD-10-CM | POA: Diagnosis not present

## 2023-02-19 DIAGNOSIS — Z992 Dependence on renal dialysis: Secondary | ICD-10-CM | POA: Diagnosis not present

## 2023-02-19 DIAGNOSIS — D57219 Sickle-cell/Hb-C disease with crisis, unspecified: Secondary | ICD-10-CM | POA: Diagnosis not present

## 2023-02-19 DIAGNOSIS — D571 Sickle-cell disease without crisis: Secondary | ICD-10-CM | POA: Diagnosis not present

## 2023-02-19 DIAGNOSIS — D631 Anemia in chronic kidney disease: Secondary | ICD-10-CM | POA: Diagnosis not present

## 2023-02-19 DIAGNOSIS — D57819 Other sickle-cell disorders with crisis, unspecified: Secondary | ICD-10-CM | POA: Diagnosis not present

## 2023-02-20 DIAGNOSIS — H2512 Age-related nuclear cataract, left eye: Secondary | ICD-10-CM | POA: Diagnosis not present

## 2023-02-22 DIAGNOSIS — H25812 Combined forms of age-related cataract, left eye: Secondary | ICD-10-CM | POA: Diagnosis not present

## 2023-02-25 ENCOUNTER — Ambulatory Visit (INDEPENDENT_AMBULATORY_CARE_PROVIDER_SITE_OTHER): Payer: HMO | Admitting: Physician Assistant

## 2023-02-25 ENCOUNTER — Encounter: Payer: Self-pay | Admitting: Physician Assistant

## 2023-02-25 DIAGNOSIS — M1711 Unilateral primary osteoarthritis, right knee: Secondary | ICD-10-CM

## 2023-02-25 MED ORDER — LIDOCAINE HCL 1 % IJ SOLN
3.0000 mL | INTRAMUSCULAR | Status: AC | PRN
Start: 2023-02-25 — End: 2023-02-25
  Administered 2023-02-25: 3 mL

## 2023-02-25 MED ORDER — METHYLPREDNISOLONE ACETATE 40 MG/ML IJ SUSP
80.0000 mg | INTRAMUSCULAR | Status: AC | PRN
Start: 2023-02-25 — End: 2023-02-25
  Administered 2023-02-25: 80 mg via INTRA_ARTICULAR

## 2023-02-25 NOTE — Progress Notes (Signed)
Office Visit Note   Patient: Patrick [name]           Date of Birth: 02-04-1956           MRN: [id number] Visit Date: 02/25/2023              Requested by: Quentin Angst, MD [address] [place],  [place] [address] PCP: Quentin Angst, MD  Chief Complaint  Patient presents with  . Right Knee - Pain      HPI: Patient is a pleasant vision impaired 67 year old gentleman who is on dialysis.  I saw him previously for knee pain and effusion.  At the time she did not want to go forward with an aspiration.  He comes in today requesting aspiration and injection.  No fever chills  Assessment & Plan: Visit Diagnoses: Left knee effusion.    Plan: No signs of infection.  Did go forward with aspiration of 35 cc of yellow fluid.  Also injected some methylprednisolone hopefully this will help him if he has any return he will contact me given his significant vision impairment as well as his orthopedic issues I did give him an application for handicap parking  Follow-Up Instructions: Return if symptoms worsen or fail to improve.   Ortho Exam  Patient is alert, oriented, no adenopathy, well-dressed, normal affect, normal respiratory effort. Right knee he does have moderate effusion no redness no cellulitis compartments are soft and compressible neurovascularly intact  Imaging: No results found. No images are attached to the encounter.  Labs: Lab Results  Component Value Date   HGBA1C 4.9 05/25/2021   HGBA1C <4.0 06/07/2011   LABURIC 5.8 05/31/2017   LABURIC 8.4 (H) 05/26/2014   LABURIC 12.0 (H) 09/17/2013   REPTSTATUS 07/25/2022 FINAL 07/25/2022   GRAMSTAIN  07/04/2022    WBC PRESENT, PREDOMINANTLY MONONUCLEAR NO ORGANISMS SEEN CYTOSPIN SMEAR Performed at [facility], [address] [place], [place] [address]    CULT  07/24/2022    NO GROWTH 5 DAYS Performed at [facility], [address] [place], [place] [address]    [facility] ESCHERICHIA COLI  08/06/2021     Lab Results  Component Value Date   ALBUMIN 2.9 (L) 11/27/2022   ALBUMIN 3.0 (L) 11/26/2022   ALBUMIN 2.9 (L) 11/24/2022   PREALBUMIN 18 07/24/2022    Lab Results  Component Value Date   MG 2.1 10/21/2022   MG 2.1 07/24/2022   MG 2.1 07/24/2022   Lab Results  Component Value Date   VD25OH 96.3 11/28/2021   VD25OH 13.7 (L) 05/24/2020   VD25OH 11.4 (L) 12/29/2019    Lab Results  Component Value Date   PREALBUMIN 18 07/24/2022      Latest Ref Rng & Units 12/04/2022   11:16 AM 11/27/2022    7:45 AM 11/26/2022    3:50 AM  CBC EXTENDED  WBC 3.4 - 10.8 x10E3/uL 3.6  4.6  5.1   RBC 4.14 - 5.80 x10E6/uL 2.58  2.34  2.51    2.49   Hemoglobin 13.0 - 17.7 g/dL 8.9  8.3  8.7   HCT 13.2 - 51.0 % 25.1  23.5  25.1   Platelets 150 - 450 x10E3/uL 128  122  127   NEUT# 1.4 - 7.0 x10E3/uL 2.3     Lymph# 0.7 - 3.1 x10E3/uL 0.5        There is no height or weight on file to calculate BMI.  Orders:  No  orders of the defined types were placed in this encounter.  No orders of the defined types were placed in this encounter.    Procedures: Large Joint Inj: R knee on 02/25/2023 1:22 PM Indications: pain and diagnostic evaluation Details: 25 G 1.5 in needle, superolateral approach  Arthrogram: No  Medications: 80 mg methylPREDNISolone acetate 40 MG/ML; 3 mL lidocaine 1 % Outcome: tolerated well, no immediate complications  After obtaining verbal consent from the patient superior lateral pouch was prepped with alcohol and Betadine.  3 cc of lidocaine was injected.  After adequate analgesia 18-gauge needle was inserted 35 cc of amber clear fluid was aspirated without difficulty methylprednisolone and lidocaine was injected Ace wrap was placed patient noted to have relief in his symptoms Procedure, treatment alternatives, risks and benefits explained, specific risks discussed. Consent was given by the patient.    Clinical Data: No additional findings.  ROS:  All  other systems negative, except as noted in the HPI. Review of Systems  Objective: Vital Signs: There were no vitals taken for this visit.  Specialty Comments:  No specialty comments available.  PMFS History: Patient Active Problem List   Diagnosis Date Noted  . Pain in right knee 02/06/2023  . Hospital discharge follow-up 12/04/2022  . Sickle cell pain crisis (HCC) 11/21/2022  . GI bleed 11/20/2022  . Cirrhosis (HCC) 10/21/2022  . Chronic hypotension 10/21/2022  . Thrombocytopenia (HCC) 10/21/2022  . Wound infection 10/02/2022  . Legally blind 10/02/2022  . Atrial fibrillation with RVR (HCC) 07/23/2022  . Hypoglycemia 07/23/2022  . Cystic lesion of abdominal viscera 04/06/2022  . Bile leak   . Cholelithiasis 11/30/2021  . Chronic pain syndrome   . Persistent atrial fibrillation (HCC) 09/13/2021  . Symptomatic anemia 09/13/2021  . Abdominal fluid collection 08/22/2021  . Gastric AVM   . Hiatal hernia   . History of GI bleed 06/27/2021  . Visual impairment 06/27/2021  . COPD (chronic obstructive pulmonary disease) (HCC) 06/27/2021  . Acute gangrenous cholecystitis s/p open cholcystectomy 05/24/2021 05/27/2021  . 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 05/24/2021 05/27/2021  . Protein-calorie malnutrition, moderate (HCC) 05/23/2021  . Prolonged QT interval 08/02/2020  . Chronic anticoagulation   . Tobacco dependence 05/28/2020  . Chronic systolic CHF (congestive heart failure) (HCC) 09/11/2019  . Encounter for therapeutic drug monitoring 12/29/2015  . 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  . Permanent atrial fibrillation (HCC) 12/24/2015  . ESRD on hemodialysis (HCC) 05/06/2014  . Vitamin D deficiency 09/25/2013  . CKD (chronic kidney disease), stage IV (HCC) 09/25/2013  . Onychomycosis of toenail 08/27/2013  . Sickle cell disease (HCC) 11/21/2012  . Sickle cell crisis (HCC) 01/17/2012  . NSVT (nonsustained ventricular tachycardia) (HCC) 10/21/2011  . Gout  10/19/2011   Past Medical History:  Diagnosis Date  . A-fib (HCC)   . Blood dyscrasia   . Blood transfusion    "I've had a bunch of them"  . CHF (congestive heart failure) (HCC)    followed by hf clinic  . Chronic kidney disease    Stage 5, dialysis on Tu/Th/Sa  . Dialysis patient (HCC) 04/2019   AV fistula (right arm), TTS  . Dysrhythmia    A-Fib per patient  . Early satiety   . Elevated LFTs 04/01/2022  . Elevated troponin 04/01/2022  . Empyema of gallbladder 05/27/2021  . GERD (gastroesophageal reflux disease)   . Gout   . Legally blind   . Metabolic acidosis with increased anion gap and accumulation of organic acids  04/01/2022  . Mitral regurgitation   . Muscle tightness-Right arm  05/27/2014  . Pneumonia   . Pulmonary hypertension (HCC)   . Shortness of breath dyspnea   . Sickle cell disease, type SS (HCC)   . Swelling abdomen   . Swelling of extremity, left   . Swelling of extremity, right   . Tricuspid regurgitation     Family History  Problem Relation Age of Onset  . Alcohol abuse Mother   . Liver disease Mother   . Cirrhosis Mother   . Heart attack Mother     Past Surgical History:  Procedure Laterality Date  . A/V FISTULAGRAM Right 07/29/2020   Procedure: A/V FISTULAGRAM - Right Arm;  Surgeon: Chuck Hint, MD;  Location: [facility] INVASIVE CV LAB;  Service: Cardiovascular;  Laterality: Right;  . BASCILIC VEIN TRANSPOSITION Right 04/12/2014   Procedure: BASILIC VEIN TRANSPOSITION;  Surgeon: Sherren Kerns, MD;  Location: [facility] OR;  Service: Vascular;  Laterality: Right;  . BILIARY STENT PLACEMENT  04/04/2022   Procedure: BILIARY STENT PLACEMENT;  Surgeon: Meryl Dare, MD;  Location: [facility] ENDOSCOPY;  Service: Gastroenterology;;  . BIOPSY  08/04/2020   Procedure: BIOPSY;  Surgeon: Meryl Dare, MD;  Location: [facility] ENDOSCOPY;  Service: Gastroenterology;;  . BIOPSY  06/28/2021   Procedure: BIOPSY;  Surgeon: Beverley Fiedler, MD;  Location: [facility] ENDOSCOPY;   Service: Gastroenterology;;  . BIOPSY  04/02/2022   Procedure: BIOPSY;  Surgeon: Lemar Lofty., MD;  Location: [facility] ENDOSCOPY;  Service: Gastroenterology;;  . BIOPSY  10/22/2022   Procedure: BIOPSY;  Surgeon: Beverley Fiedler, MD;  Location: [facility] ENDOSCOPY;  Service: Gastroenterology;;  . BOWEL RESECTION N/A 05/24/2021   Procedure: SMALL BOWEL RESECTION;  Surgeon: Emelia Loron, MD;  Location: [facility] OR;  Service: General;  Laterality: N/A;  . CARDIOVERSION N/A 06/05/2018   Procedure: CARDIOVERSION;  Surgeon: Dolores Patty, MD;  Location: [facility] ENDOSCOPY;  Service: Cardiovascular;  Laterality: N/A;  . CARDIOVERSION N/A 10/06/2019   Procedure: CARDIOVERSION;  Surgeon: Dolores Patty, MD;  Location: [facility] ENDOSCOPY;  Service: Cardiovascular;  Laterality: N/A;  . CHOLECYSTECTOMY N/A 05/24/2021   Procedure: OPEN CHOLECYSTECTOMY;  Surgeon: Emelia Loron, MD;  Location: [facility] OR;  Service: General;  Laterality: N/A;  . COLONOSCOPY WITH PROPOFOL N/A 09/24/2017   Procedure: COLONOSCOPY WITH PROPOFOL;  Surgeon: Lynann Bologna, MD;  Location: [facility] ENDOSCOPY;  Service: Endoscopy;  Laterality: N/A;  . ENDOSCOPIC RETROGRADE CHOLANGIOPANCREATOGRAPHY (ERCP) WITH PROPOFOL N/A 05/17/2022   Procedure: ENDOSCOPIC RETROGRADE CHOLANGIOPANCREATOGRAPHY (ERCP) WITH PROPOFOL;  Surgeon: Meridee Score Netty Starring., MD;  Location: [facility] ENDOSCOPY;  Service: Gastroenterology;  Laterality: N/A;  . ERCP N/A 04/04/2022   Procedure: ENDOSCOPIC RETROGRADE CHOLANGIOPANCREATOGRAPHY (ERCP);  Surgeon: Meryl Dare, MD;  Location: [facility] ENDOSCOPY;  Service: Gastroenterology;  Laterality: N/A;  . ESOPHAGOGASTRODUODENOSCOPY N/A 09/19/2017   Procedure: ESOPHAGOGASTRODUODENOSCOPY (EGD);  Surgeon: Lynann Bologna, MD;  Location: [facility] ENDOSCOPY;  Service: Endoscopy;  Laterality: N/A;  . ESOPHAGOGASTRODUODENOSCOPY N/A 05/29/2021   Procedure: ESOPHAGOGASTRODUODENOSCOPY (EGD);  Surgeon: Rachael Fee, MD;  Location: [facility] ENDOSCOPY;  Service:  Gastroenterology;  Laterality: N/A;  . ESOPHAGOGASTRODUODENOSCOPY (EGD) WITH PROPOFOL N/A 08/04/2020   Procedure: ESOPHAGOGASTRODUODENOSCOPY (EGD) WITH PROPOFOL;  Surgeon: Meryl Dare, MD;  Location: [facility] ENDOSCOPY;  Service: Gastroenterology;  Laterality: N/A;  . ESOPHAGOGASTRODUODENOSCOPY (EGD) WITH PROPOFOL N/A 06/28/2021   Procedure: ESOPHAGOGASTRODUODENOSCOPY (EGD) WITH PROPOFOL;  Surgeon: Beverley Fiedler, MD;  Location: [facility] ENDOSCOPY;  Service: Gastroenterology;  Laterality: N/A;  . ESOPHAGOGASTRODUODENOSCOPY (EGD) WITH PROPOFOL N/A 08/10/2021  Procedure: ESOPHAGOGASTRODUODENOSCOPY (EGD) WITH PROPOFOL;  Surgeon: Lynann Bologna, MD;  Location: [facility] ENDOSCOPY;  Service: Gastroenterology;  Laterality: N/A;  . ESOPHAGOGASTRODUODENOSCOPY (EGD) WITH PROPOFOL N/A 08/02/2021   Procedure: ESOPHAGOGASTRODUODENOSCOPY (EGD) WITH PROPOFOL;  Surgeon: Shellia Cleverly, DO;  Location: [facility] ENDOSCOPY;  Service: Gastroenterology;  Laterality: N/A;  . ESOPHAGOGASTRODUODENOSCOPY (EGD) WITH PROPOFOL N/A 04/02/2022   Procedure: ESOPHAGOGASTRODUODENOSCOPY (EGD) WITH PROPOFOL;  Surgeon: Meridee Score Netty Starring., MD;  Location: [facility] ENDOSCOPY;  Service: Gastroenterology;  Laterality: N/A;  . ESOPHAGOGASTRODUODENOSCOPY (EGD) WITH PROPOFOL N/A 05/17/2022   Procedure: ESOPHAGOGASTRODUODENOSCOPY (EGD) WITH PROPOFOL;  Surgeon: Meridee Score Netty Starring., MD;  Location: [facility] ENDOSCOPY;  Service: Gastroenterology;  Laterality: N/A;  . ESOPHAGOGASTRODUODENOSCOPY (EGD) WITH PROPOFOL N/A 10/22/2022   Procedure: ESOPHAGOGASTRODUODENOSCOPY (EGD) WITH PROPOFOL;  Surgeon: Beverley Fiedler, MD;  Location: [facility] ENDOSCOPY;  Service: Gastroenterology;  Laterality: N/A;  . EUS N/A 05/17/2022   Procedure: UPPER ENDOSCOPIC ULTRASOUND (EUS) RADIAL;  Surgeon: Meridee Score Netty Starring., MD;  Location: [facility] ENDOSCOPY;  Service: Gastroenterology;  Laterality: N/A;  . FINE NEEDLE ASPIRATION  04/02/2022   Procedure: FINE NEEDLE ASPIRATION (FNA) LINEAR;  Surgeon:  Lemar Lofty., MD;  Location: [facility] ENDOSCOPY;  Service: Gastroenterology;;  . FINE NEEDLE ASPIRATION N/A 05/17/2022   Procedure: FINE NEEDLE ASPIRATION (FNA) LINEAR;  Surgeon: Lemar Lofty., MD;  Location: [facility] ENDOSCOPY;  Service: Gastroenterology;  Laterality: N/A;  . HEMOSTASIS CLIP PLACEMENT  05/29/2021   Procedure: HEMOSTASIS CLIP PLACEMENT;  Surgeon: Rachael Fee, MD;  Location: [facility] ENDOSCOPY;  Service: Gastroenterology;;  . HOT HEMOSTASIS N/A 05/29/2021   Procedure: HOT HEMOSTASIS (ARGON PLASMA COAGULATION/BICAP);  Surgeon: Rachael Fee, MD;  Location: [facility] ENDOSCOPY;  Service: Gastroenterology;  Laterality: N/A;  . HOT HEMOSTASIS N/A 06/28/2021   Procedure: HOT HEMOSTASIS (ARGON PLASMA COAGULATION/BICAP);  Surgeon: Beverley Fiedler, MD;  Location: [facility] ENDOSCOPY;  Service: Gastroenterology;  Laterality: N/A;  . HOT HEMOSTASIS N/A 08/10/2021   Procedure: HOT HEMOSTASIS (ARGON PLASMA COAGULATION/BICAP);  Surgeon: Lynann Bologna, MD;  Location: [facility] ENDOSCOPY;  Service: Gastroenterology;  Laterality: N/A;  . HOT HEMOSTASIS N/A 08/02/2021   Procedure: HOT HEMOSTASIS (ARGON PLASMA COAGULATION/BICAP);  Surgeon: Shellia Cleverly, DO;  Location: [facility] ENDOSCOPY;  Service: Gastroenterology;  Laterality: N/A;  . IR PARACENTESIS  07/04/2022  . IR PERC TUN PERIT CATH WO PORT S&I Judi Cong  05/23/2021  . IR REMOVAL TUN CV CATH W/O FL  06/10/2021  . IR US GUIDE VASC ACCESS RIGHT  05/23/2021  . LAPAROSCOPY N/A 05/24/2021   Procedure: LAPAROSCOPY DIAGNOSTIC;  Surgeon: Emelia Loron, MD;  Location: [facility] OR;  Service: General;  Laterality: N/A;  . LAPAROTOMY N/A 05/24/2021   Procedure: EXPLORATORY LAPAROTOMY;  Surgeon: Emelia Loron, MD;  Location: [facility] OR;  Service: General;  Laterality: N/A;  . LEFT AND RIGHT HEART CATHETERIZATION WITH CORONARY ANGIOGRAM N/A 06/11/2011   Procedure: LEFT AND RIGHT HEART CATHETERIZATION WITH CORONARY ANGIOGRAM;  Surgeon: Laurey Morale, MD;  Location: [facility]  CATH LAB;  Service: Cardiovascular;  Laterality: N/A;  . PERIPHERAL VASCULAR BALLOON ANGIOPLASTY Right 07/29/2020   Procedure: PERIPHERAL VASCULAR BALLOON ANGIOPLASTY;  Surgeon: Chuck Hint, MD;  Location: [facility] INVASIVE CV LAB;  Service: Cardiovascular;  Laterality: Right;  arm fistula  . REMOVAL OF STONES  05/17/2022   Procedure: REMOVAL OF STONES;  Surgeon: Meridee Score Netty Starring., MD;  Location: [facility] ENDOSCOPY;  Service: Gastroenterology;;  . REVISON OF ARTERIOVENOUS FISTULA Right 08/08/2020   Procedure: ANEURYSM EXCISION OF RIGHT UPPER EXTREMITY ARTERIOVENOUS FISTULA;  Surgeon: Chuck Hint, MD;  Location: [facility] OR;  Service: Vascular;  Laterality: Right;  . REVISON OF ARTERIOVENOUS FISTULA Right 03/21/2022   Procedure: EXCISION OF ULCERATED SKIN OF RIGHT ARTERIOVENOUS FISTULA REVISION;  Surgeon: Cephus Shelling, MD;  Location: [facility] OR;  Service: Vascular;  Laterality: Right;  . REVISON OF ARTERIOVENOUS FISTULA Right 07/02/2022   Procedure: EXCISION OF ULCERS RIGHT ARTERIOVENOUS FISTULA;  Surgeon: Chuck Hint, MD;  Location: [facility] OR;  Service: Vascular;  Laterality: Right;  . SCLEROTHERAPY  05/29/2021   Procedure: SCLEROTHERAPY;  Surgeon: Rachael Fee, MD;  Location: [facility] ENDOSCOPY;  Service: Gastroenterology;;  . Dennison Mascot  04/04/2022   Procedure: Dennison Mascot;  Surgeon: Meryl Dare, MD;  Location: [facility] ENDOSCOPY;  Service: Gastroenterology;;  . Francine Graven REMOVAL  05/29/2021   Procedure: STENT REMOVAL;  Surgeon: Rachael Fee, MD;  Location: [facility] ENDOSCOPY;  Service: Gastroenterology;;  . Francine Graven REMOVAL  05/17/2022   Procedure: STENT REMOVAL;  Surgeon: Lemar Lofty., MD;  Location: [facility] ENDOSCOPY;  Service: Gastroenterology;;  . TEE WITHOUT CARDIOVERSION N/A 12/27/2015   Procedure: TRANSESOPHAGEAL ECHOCARDIOGRAM (TEE);  Surgeon: Quintella Reichert, MD;  Location: [facility] ENDOSCOPY;  Service: Cardiovascular;  Laterality: N/A;  . UPPER ESOPHAGEAL ENDOSCOPIC  ULTRASOUND (EUS) N/A 04/02/2022   Procedure: UPPER ESOPHAGEAL ENDOSCOPIC ULTRASOUND (EUS);  Surgeon: Lemar Lofty., MD;  Location: [facility] ENDOSCOPY;  Service: Gastroenterology;  Laterality: N/A;   Social History   Occupational History  . Occupation: disabled  Tobacco Use  . Smoking status: Former    Current packs/day: 0.00    Average packs/day: 0.5 packs/day for 41.0 years (20.5 ttl pk-yrs)    Types: Cigarettes    Start date: 12/08/1974    Quit date: 12/08/2015    Years since quitting: 7.2  . Smokeless tobacco: Never  Vaping Use  . Vaping status: Never Used  Substance and Sexual Activity  . Alcohol use: Not Currently    Alcohol/week: 3.0 standard drinks of alcohol    Types: 1 Glasses of wine, 2 Cans of beer per week    Comment: none since starting dialysis 06/2021  . Drug use: No  . Sexual activity: Not Currently

## 2023-02-26 DIAGNOSIS — D57819 Other sickle-cell disorders with crisis, unspecified: Secondary | ICD-10-CM | POA: Diagnosis not present

## 2023-02-26 DIAGNOSIS — D631 Anemia in chronic kidney disease: Secondary | ICD-10-CM | POA: Diagnosis not present

## 2023-02-26 DIAGNOSIS — D571 Sickle-cell disease without crisis: Secondary | ICD-10-CM | POA: Diagnosis not present

## 2023-02-26 DIAGNOSIS — N186 End stage renal disease: Secondary | ICD-10-CM | POA: Diagnosis not present

## 2023-02-26 DIAGNOSIS — N2581 Secondary hyperparathyroidism of renal origin: Secondary | ICD-10-CM | POA: Diagnosis not present

## 2023-02-26 DIAGNOSIS — Z992 Dependence on renal dialysis: Secondary | ICD-10-CM | POA: Diagnosis not present

## 2023-02-26 DIAGNOSIS — D57219 Sickle-cell/Hb-C disease with crisis, unspecified: Secondary | ICD-10-CM | POA: Diagnosis not present

## 2023-02-26 DIAGNOSIS — D5700 Hb-SS disease with crisis, unspecified: Secondary | ICD-10-CM | POA: Diagnosis not present

## 2023-03-05 DIAGNOSIS — D5700 Hb-SS disease with crisis, unspecified: Secondary | ICD-10-CM | POA: Diagnosis not present

## 2023-03-05 DIAGNOSIS — D509 Iron deficiency anemia, unspecified: Secondary | ICD-10-CM | POA: Diagnosis not present

## 2023-03-05 DIAGNOSIS — N2581 Secondary hyperparathyroidism of renal origin: Secondary | ICD-10-CM | POA: Diagnosis not present

## 2023-03-05 DIAGNOSIS — D571 Sickle-cell disease without crisis: Secondary | ICD-10-CM | POA: Diagnosis not present

## 2023-03-05 DIAGNOSIS — D57219 Sickle-cell/Hb-C disease with crisis, unspecified: Secondary | ICD-10-CM | POA: Diagnosis not present

## 2023-03-05 DIAGNOSIS — Z992 Dependence on renal dialysis: Secondary | ICD-10-CM | POA: Diagnosis not present

## 2023-03-05 DIAGNOSIS — D631 Anemia in chronic kidney disease: Secondary | ICD-10-CM | POA: Diagnosis not present

## 2023-03-05 DIAGNOSIS — N186 End stage renal disease: Secondary | ICD-10-CM | POA: Diagnosis not present

## 2023-03-05 DIAGNOSIS — D57819 Other sickle-cell disorders with crisis, unspecified: Secondary | ICD-10-CM | POA: Diagnosis not present

## 2023-03-06 DIAGNOSIS — D571 Sickle-cell disease without crisis: Secondary | ICD-10-CM | POA: Diagnosis not present

## 2023-03-06 DIAGNOSIS — H35053 Retinal neovascularization, unspecified, bilateral: Secondary | ICD-10-CM | POA: Diagnosis not present

## 2023-03-06 DIAGNOSIS — H3523 Other non-diabetic proliferative retinopathy, bilateral: Secondary | ICD-10-CM | POA: Diagnosis not present

## 2023-03-06 DIAGNOSIS — H3341 Traction detachment of retina, right eye: Secondary | ICD-10-CM | POA: Diagnosis not present

## 2023-03-11 ENCOUNTER — Telehealth: Payer: Self-pay | Admitting: Internal Medicine

## 2023-03-11 NOTE — Telephone Encounter (Signed)
Caller & Relationship to patient:  MRN #  [id number]   Call Back Number:   Date of Last Office Visit: 02/12/2023     Date of Next Office Visit: 04/23/2023    Medication(s) to be Refilled: Oxycodone 6 Hr & Oxycodone 12 Hr  Preferred Pharmacy:   ** Please notify patient to allow 48-72 hours to process** **Let patient know to contact pharmacy at the end of the day to make sure medication is ready. ** **If patient has not been seen in a year or longer, book an appointment **Advise to use MyChart for refill requests OR to contact their pharmacy

## 2023-03-12 ENCOUNTER — Other Ambulatory Visit: Payer: Self-pay | Admitting: Family Medicine

## 2023-03-12 DIAGNOSIS — D631 Anemia in chronic kidney disease: Secondary | ICD-10-CM | POA: Diagnosis not present

## 2023-03-12 DIAGNOSIS — D571 Sickle-cell disease without crisis: Secondary | ICD-10-CM

## 2023-03-12 DIAGNOSIS — Z992 Dependence on renal dialysis: Secondary | ICD-10-CM | POA: Diagnosis not present

## 2023-03-12 DIAGNOSIS — N186 End stage renal disease: Secondary | ICD-10-CM | POA: Diagnosis not present

## 2023-03-12 DIAGNOSIS — D57219 Sickle-cell/Hb-C disease with crisis, unspecified: Secondary | ICD-10-CM | POA: Diagnosis not present

## 2023-03-12 DIAGNOSIS — G894 Chronic pain syndrome: Secondary | ICD-10-CM

## 2023-03-12 DIAGNOSIS — D57819 Other sickle-cell disorders with crisis, unspecified: Secondary | ICD-10-CM | POA: Diagnosis not present

## 2023-03-12 DIAGNOSIS — D5700 Hb-SS disease with crisis, unspecified: Secondary | ICD-10-CM | POA: Diagnosis not present

## 2023-03-12 DIAGNOSIS — N2581 Secondary hyperparathyroidism of renal origin: Secondary | ICD-10-CM | POA: Diagnosis not present

## 2023-03-12 IMAGING — CT CT ABD-PELV W/ CM
2 of 4 series · 16 of 46 positions shown, 18 images · IV contrast (Omni 300)
Comparison: 05/19/2021

CLINICAL DATA: Abdominal pain

EXAM:
CT ABDOMEN AND PELVIS WITH CONTRAST
TECHNIQUE: Multidetector CT imaging of the abdomen and pelvis was performed
using the standard protocol following bolus administration of
intravenous contrast.
CONTRAST:  80mL U5OA0M-LMM IOPAMIDOL (U5OA0M-LMM) INJECTION 76%

[Series 3: a/p w/ 5mm · axial · 0.74mm/px · z∈[+961,+1396]mm · 13 of 95 slices shown, 15 images]
[im 4/95  soft-tissue]
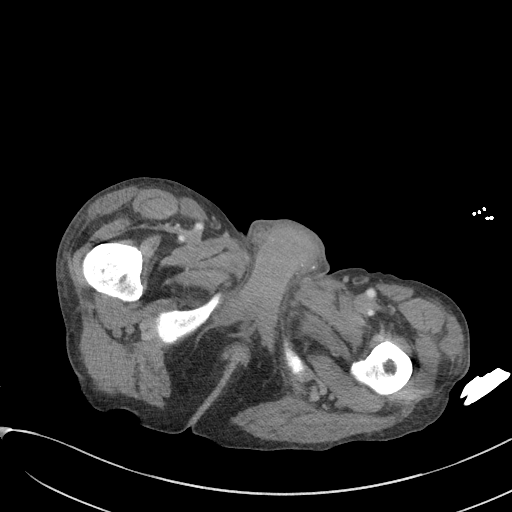
[im 4/95  bone]
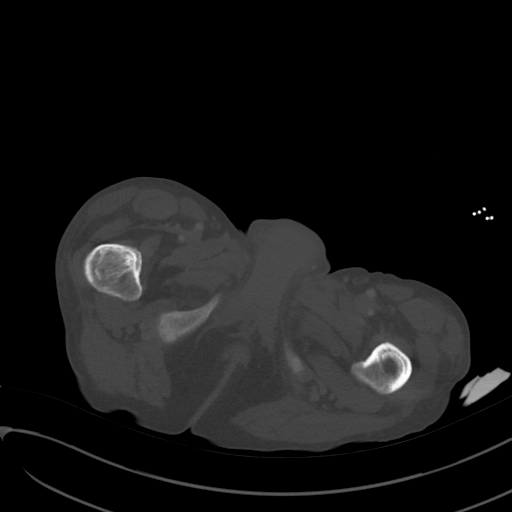
[im 12/95  soft-tissue]
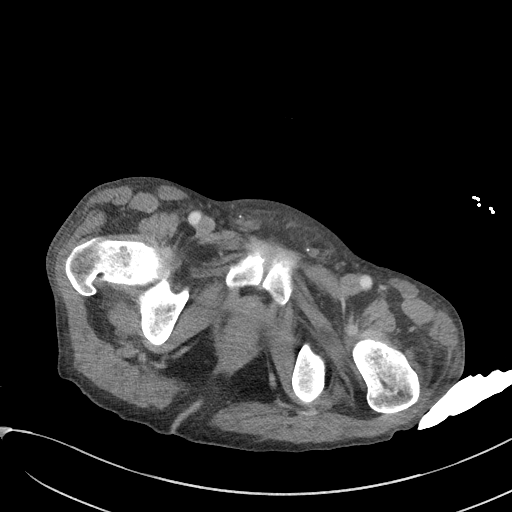
[im 20/95  soft-tissue]
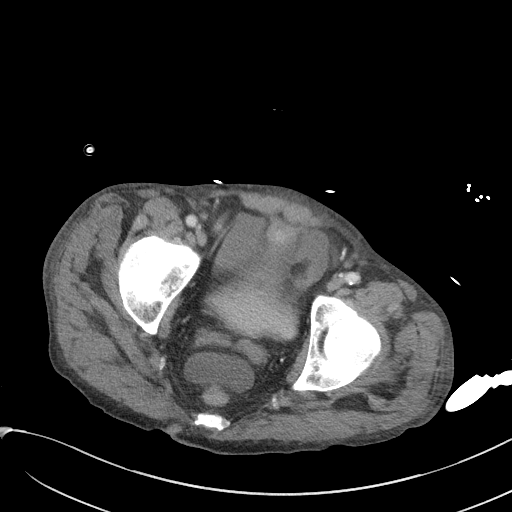
[im 28/95  soft-tissue]
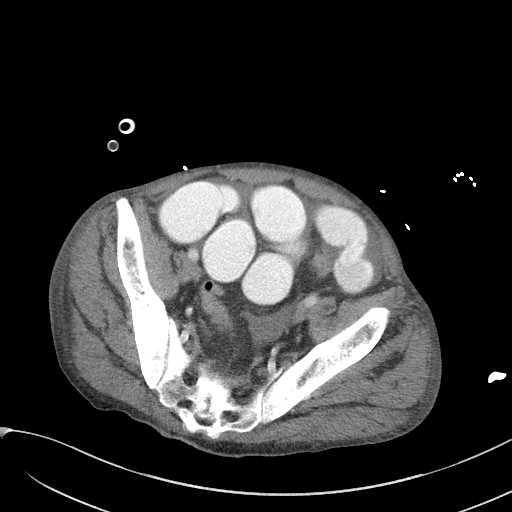
[im 32/95  soft-tissue]
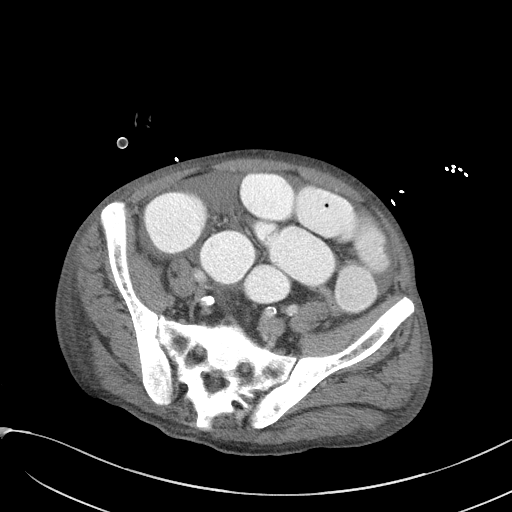
[im 40/95  soft-tissue]
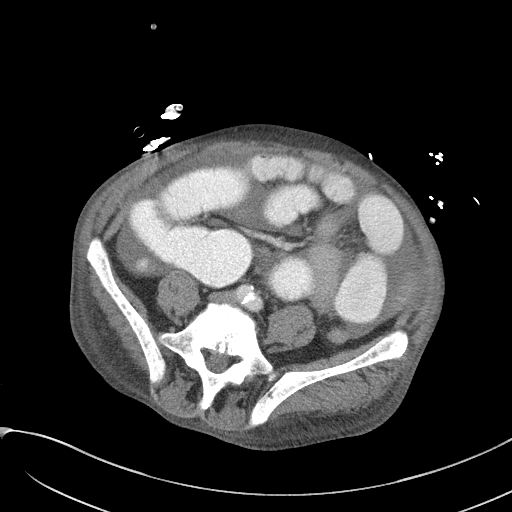
[im 48/95  soft-tissue]
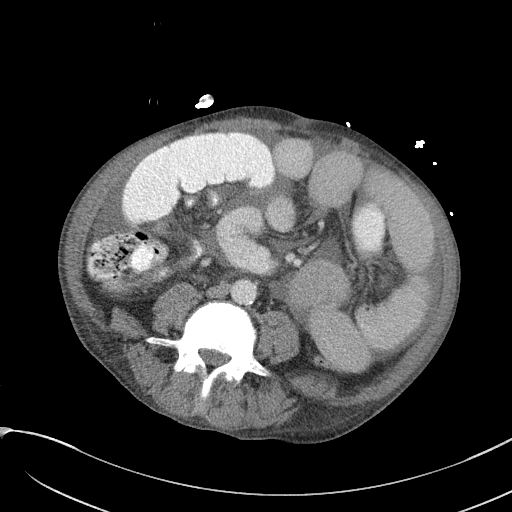
[im 55/95  soft-tissue]
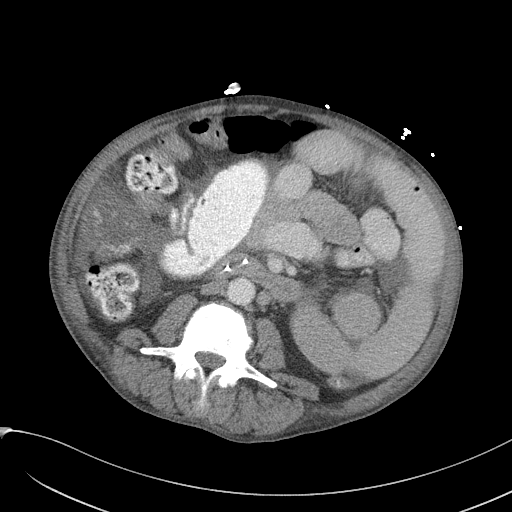
[im 63/95  soft-tissue]
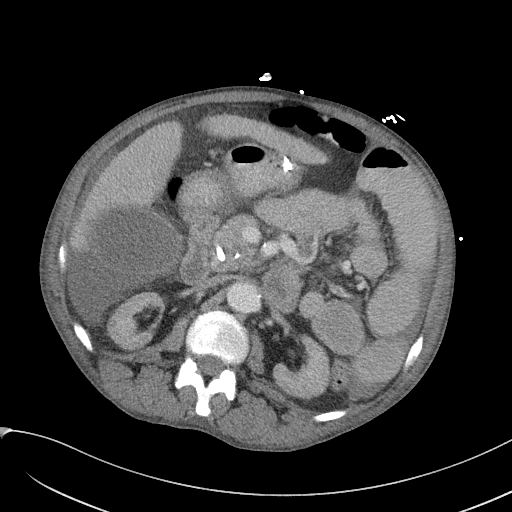
[im 63/95  bone]
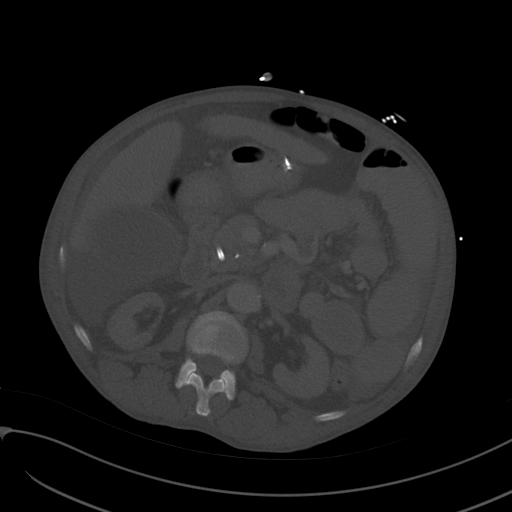
[im 67/95  soft-tissue]
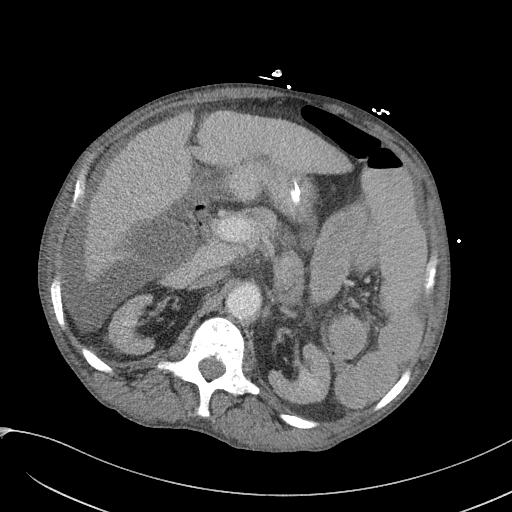
[im 75/95  soft-tissue]
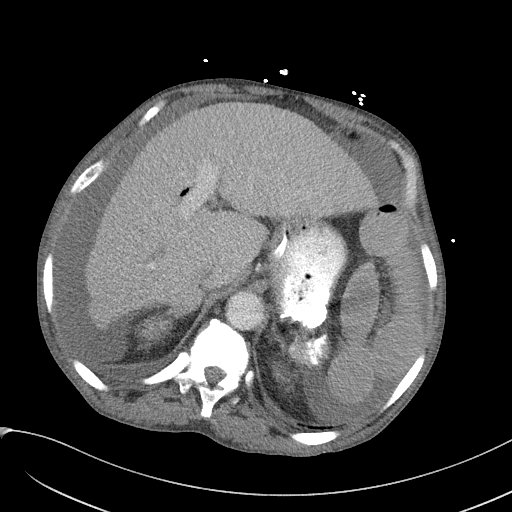
[im 83/95  soft-tissue]
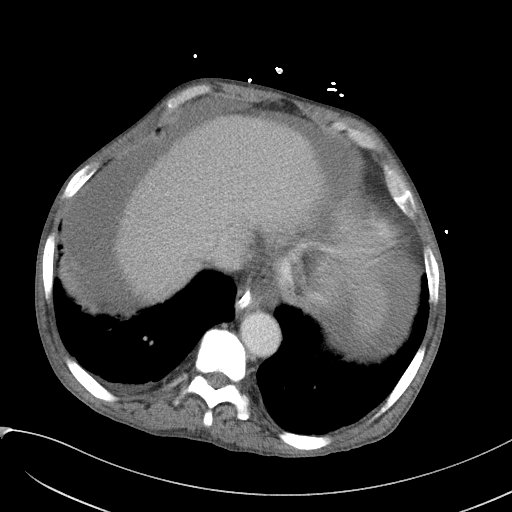
[im 91/95  soft-tissue]
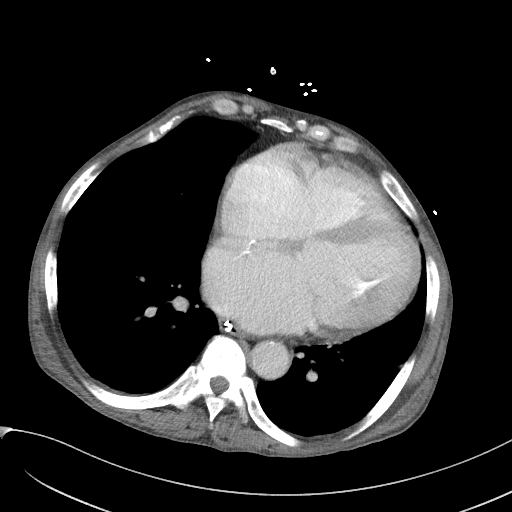

[Series 6: a/p w/ cor · coronal · 0.68mm/px · 3 of 151 slices shown]
[im 51/151  soft-tissue]
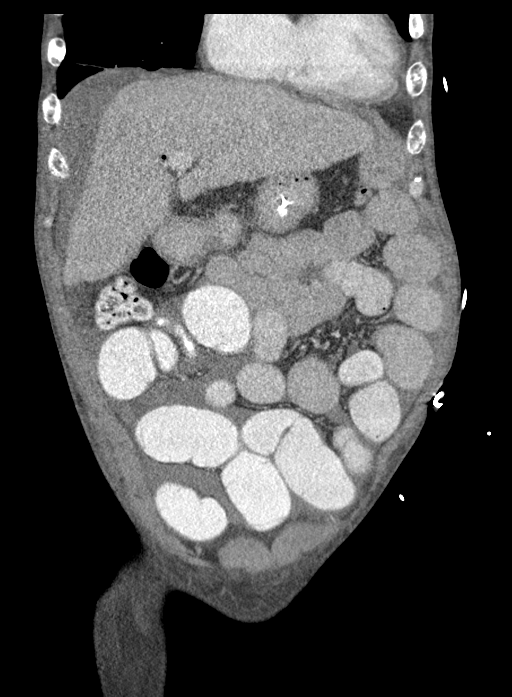
[im 67/151  soft-tissue]
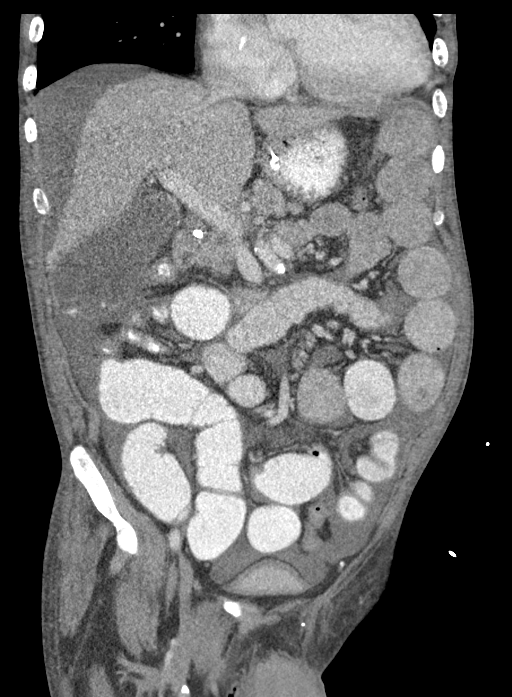
[im 84/151  soft-tissue]
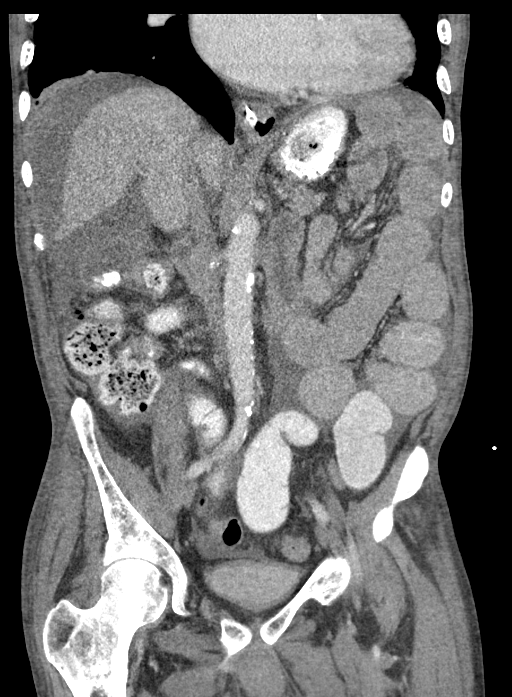

[16 of 46 positions shown; findings below may reference images not displayed]

FINDINGS: LOWER CHEST: Moderate cardiomegaly

HEPATOBILIARY: There is a common bile duct stent. There are stones
and air within the gallbladder. Large amount of upper ascites,
increased. Mild hepatic nodularity.

PANCREAS: Normal pancreas. Pancreatic stent in unchanged position.
No ductal dilatation or peripancreatic fluid collection.

SPLEEN: Calcified splenic remnant

ADRENALS/URINARY TRACT: The adrenal glands are normal. No
hydronephrosis, nephroureterolithiasis or solid renal mass. The
urinary bladder is normal for degree of distention

STOMACH/BOWEL: There is no hiatal hernia. Normal duodenal course and
caliber. There is diffusely dilated small bowel, slightly progressed
from the prior study. The terminal ileum is decompressed. No focal
colonic abnormality. Normal appendix.

VASCULAR/LYMPHATIC: Normal course and caliber of the major abdominal
vessels. No abdominal or pelvic lymphadenopathy.

REPRODUCTIVE: Normal prostate size with symmetric seminal vesicles.

MUSCULOSKELETAL. Bone marrow changes reflecting sickle cell disease.

OTHER: None.
IMPRESSION: 1. Slightly progressed, diffusely dilated small bowel with
decompressed terminal ileum, consistent with small bowel
obstruction.
2. Large amount of upper abdominal ascites, increased from the prior
study.
3. Moderate cardiomegaly.

Aortic Atherosclerosis ([id number]-4UX.X).

## 2023-03-12 MED ORDER — OXYCODONE HCL 10 MG PO TABS
10.0000 mg | ORAL_TABLET | Freq: Four times a day (QID) | ORAL | 0 refills | Status: DC | PRN
Start: 2023-03-12 — End: 2023-04-23

## 2023-03-12 NOTE — Progress Notes (Signed)
Reviewed PDMP substance reporting system prior to prescribing opiate medications. No inconsistencies noted.  Meds ordered this encounter  Medications   Oxycodone HCl 10 MG TABS    Sig: Take 1 tablet (10 mg total) by mouth every 6 (six) hours as needed.    Dispense:  60 tablet    Refill:  0    Order Specific Question:   Supervising Provider    Answer:   Quentin Angst [[id number]]   Nolon Nations  APRN, MSN, FNP-C [facility] [address] [place], [place] [address] [phone]

## 2023-03-18 DIAGNOSIS — I158 Other secondary hypertension: Secondary | ICD-10-CM | POA: Diagnosis not present

## 2023-03-18 DIAGNOSIS — Z992 Dependence on renal dialysis: Secondary | ICD-10-CM | POA: Diagnosis not present

## 2023-03-18 DIAGNOSIS — N186 End stage renal disease: Secondary | ICD-10-CM | POA: Diagnosis not present

## 2023-03-19 DIAGNOSIS — D571 Sickle-cell disease without crisis: Secondary | ICD-10-CM | POA: Diagnosis not present

## 2023-03-19 DIAGNOSIS — D631 Anemia in chronic kidney disease: Secondary | ICD-10-CM | POA: Diagnosis not present

## 2023-03-19 DIAGNOSIS — D57819 Other sickle-cell disorders with crisis, unspecified: Secondary | ICD-10-CM | POA: Diagnosis not present

## 2023-03-19 DIAGNOSIS — Z992 Dependence on renal dialysis: Secondary | ICD-10-CM | POA: Diagnosis not present

## 2023-03-19 DIAGNOSIS — D57219 Sickle-cell/Hb-C disease with crisis, unspecified: Secondary | ICD-10-CM | POA: Diagnosis not present

## 2023-03-19 DIAGNOSIS — D5700 Hb-SS disease with crisis, unspecified: Secondary | ICD-10-CM | POA: Diagnosis not present

## 2023-03-19 DIAGNOSIS — N186 End stage renal disease: Secondary | ICD-10-CM | POA: Diagnosis not present

## 2023-03-19 DIAGNOSIS — N2581 Secondary hyperparathyroidism of renal origin: Secondary | ICD-10-CM | POA: Diagnosis not present

## 2023-03-21 ENCOUNTER — Telehealth: Payer: Self-pay | Admitting: Internal Medicine

## 2023-03-21 NOTE — Telephone Encounter (Signed)
Caller & Relationship to patient:  MRN #  [id number]   Call Back Number:   Date of Last Office Visit: 03/11/2023     Date of Next Office Visit: 04/23/2023    Medication(s) to be Refilled: Oxycodone 12 hr  Preferred Pharmacy:   ** Please notify patient to allow 48-72 hours to process** **Let patient know to contact pharmacy at the end of the day to make sure medication is ready. ** **If patient has not been seen in a year or longer, book an appointment **Advise to use MyChart for refill requests OR to contact their pharmacy

## 2023-03-22 ENCOUNTER — Encounter (HOSPITAL_COMMUNITY): Payer: PPO | Admitting: Internal Medicine

## 2023-03-25 ENCOUNTER — Other Ambulatory Visit: Payer: Self-pay | Admitting: Family Medicine

## 2023-03-25 DIAGNOSIS — G894 Chronic pain syndrome: Secondary | ICD-10-CM

## 2023-03-25 DIAGNOSIS — D571 Sickle-cell disease without crisis: Secondary | ICD-10-CM

## 2023-03-25 MED ORDER — OXYCODONE HCL ER 10 MG PO T12A
10.0000 mg | EXTENDED_RELEASE_TABLET | Freq: Two times a day (BID) | ORAL | 0 refills | Status: DC
Start: 2023-03-25 — End: 2023-04-23

## 2023-03-25 NOTE — Progress Notes (Signed)
Reviewed PDMP substance reporting system prior to prescribing opiate medications. No inconsistencies noted.  Meds ordered this encounter  Medications   oxyCODONE (OXYCONTIN) 10 mg 12 hr tablet    Sig: Take 1 tablet (10 mg total) by mouth every 12 (twelve) hours.    Dispense:  30 tablet    Refill:  0    Order Specific Question:   Supervising Provider    Answer:   Quentin Angst [[id number]]   Nolon Nations  APRN, MSN, FNP-C Patient Care [facility] [address] [place], [place] [address] [phone]

## 2023-03-26 DIAGNOSIS — D5700 Hb-SS disease with crisis, unspecified: Secondary | ICD-10-CM | POA: Diagnosis not present

## 2023-03-26 DIAGNOSIS — D571 Sickle-cell disease without crisis: Secondary | ICD-10-CM | POA: Diagnosis not present

## 2023-03-26 DIAGNOSIS — D57219 Sickle-cell/Hb-C disease with crisis, unspecified: Secondary | ICD-10-CM | POA: Diagnosis not present

## 2023-03-26 DIAGNOSIS — N2581 Secondary hyperparathyroidism of renal origin: Secondary | ICD-10-CM | POA: Diagnosis not present

## 2023-03-26 DIAGNOSIS — D57819 Other sickle-cell disorders with crisis, unspecified: Secondary | ICD-10-CM | POA: Diagnosis not present

## 2023-03-26 DIAGNOSIS — N186 End stage renal disease: Secondary | ICD-10-CM | POA: Diagnosis not present

## 2023-03-26 DIAGNOSIS — D631 Anemia in chronic kidney disease: Secondary | ICD-10-CM | POA: Diagnosis not present

## 2023-03-26 DIAGNOSIS — Z992 Dependence on renal dialysis: Secondary | ICD-10-CM | POA: Diagnosis not present

## 2023-03-27 DIAGNOSIS — H3523 Other non-diabetic proliferative retinopathy, bilateral: Secondary | ICD-10-CM | POA: Diagnosis not present

## 2023-03-27 DIAGNOSIS — H31011 Macula scars of posterior pole (postinflammatory) (post-traumatic), right eye: Secondary | ICD-10-CM | POA: Diagnosis not present

## 2023-03-27 DIAGNOSIS — H3341 Traction detachment of retina, right eye: Secondary | ICD-10-CM | POA: Diagnosis not present

## 2023-03-27 DIAGNOSIS — H33011 Retinal detachment with single break, right eye: Secondary | ICD-10-CM | POA: Diagnosis not present

## 2023-04-01 DIAGNOSIS — H3341 Traction detachment of retina, right eye: Secondary | ICD-10-CM | POA: Diagnosis not present

## 2023-04-01 DIAGNOSIS — H3523 Other non-diabetic proliferative retinopathy, bilateral: Secondary | ICD-10-CM | POA: Diagnosis not present

## 2023-04-01 DIAGNOSIS — D571 Sickle-cell disease without crisis: Secondary | ICD-10-CM | POA: Diagnosis not present

## 2023-04-01 DIAGNOSIS — H35053 Retinal neovascularization, unspecified, bilateral: Secondary | ICD-10-CM | POA: Diagnosis not present

## 2023-04-02 DIAGNOSIS — D57819 Other sickle-cell disorders with crisis, unspecified: Secondary | ICD-10-CM | POA: Diagnosis not present

## 2023-04-02 DIAGNOSIS — N2581 Secondary hyperparathyroidism of renal origin: Secondary | ICD-10-CM | POA: Diagnosis not present

## 2023-04-02 DIAGNOSIS — Z992 Dependence on renal dialysis: Secondary | ICD-10-CM | POA: Diagnosis not present

## 2023-04-02 DIAGNOSIS — D5700 Hb-SS disease with crisis, unspecified: Secondary | ICD-10-CM | POA: Diagnosis not present

## 2023-04-02 DIAGNOSIS — D631 Anemia in chronic kidney disease: Secondary | ICD-10-CM | POA: Diagnosis not present

## 2023-04-02 DIAGNOSIS — N186 End stage renal disease: Secondary | ICD-10-CM | POA: Diagnosis not present

## 2023-04-02 DIAGNOSIS — D509 Iron deficiency anemia, unspecified: Secondary | ICD-10-CM | POA: Diagnosis not present

## 2023-04-02 DIAGNOSIS — D57219 Sickle-cell/Hb-C disease with crisis, unspecified: Secondary | ICD-10-CM | POA: Diagnosis not present

## 2023-04-02 DIAGNOSIS — Z23 Encounter for immunization: Secondary | ICD-10-CM | POA: Diagnosis not present

## 2023-04-02 DIAGNOSIS — D571 Sickle-cell disease without crisis: Secondary | ICD-10-CM | POA: Diagnosis not present

## 2023-04-05 ENCOUNTER — Other Ambulatory Visit: Payer: Self-pay | Admitting: Family Medicine

## 2023-04-05 DIAGNOSIS — D571 Sickle-cell disease without crisis: Secondary | ICD-10-CM

## 2023-04-09 DIAGNOSIS — N2581 Secondary hyperparathyroidism of renal origin: Secondary | ICD-10-CM | POA: Diagnosis not present

## 2023-04-09 DIAGNOSIS — Z992 Dependence on renal dialysis: Secondary | ICD-10-CM | POA: Diagnosis not present

## 2023-04-09 DIAGNOSIS — D57819 Other sickle-cell disorders with crisis, unspecified: Secondary | ICD-10-CM | POA: Diagnosis not present

## 2023-04-09 DIAGNOSIS — N186 End stage renal disease: Secondary | ICD-10-CM | POA: Diagnosis not present

## 2023-04-09 DIAGNOSIS — D631 Anemia in chronic kidney disease: Secondary | ICD-10-CM | POA: Diagnosis not present

## 2023-04-09 DIAGNOSIS — D5700 Hb-SS disease with crisis, unspecified: Secondary | ICD-10-CM | POA: Diagnosis not present

## 2023-04-09 DIAGNOSIS — D57219 Sickle-cell/Hb-C disease with crisis, unspecified: Secondary | ICD-10-CM | POA: Diagnosis not present

## 2023-04-09 DIAGNOSIS — D571 Sickle-cell disease without crisis: Secondary | ICD-10-CM | POA: Diagnosis not present

## 2023-04-15 ENCOUNTER — Other Ambulatory Visit: Payer: Self-pay | Admitting: Internal Medicine

## 2023-04-15 DIAGNOSIS — D571 Sickle-cell disease without crisis: Secondary | ICD-10-CM

## 2023-04-16 DIAGNOSIS — D5700 Hb-SS disease with crisis, unspecified: Secondary | ICD-10-CM | POA: Diagnosis not present

## 2023-04-16 DIAGNOSIS — N186 End stage renal disease: Secondary | ICD-10-CM | POA: Diagnosis not present

## 2023-04-16 DIAGNOSIS — D57219 Sickle-cell/Hb-C disease with crisis, unspecified: Secondary | ICD-10-CM | POA: Diagnosis not present

## 2023-04-16 DIAGNOSIS — Z992 Dependence on renal dialysis: Secondary | ICD-10-CM | POA: Diagnosis not present

## 2023-04-16 DIAGNOSIS — D57819 Other sickle-cell disorders with crisis, unspecified: Secondary | ICD-10-CM | POA: Diagnosis not present

## 2023-04-16 DIAGNOSIS — D631 Anemia in chronic kidney disease: Secondary | ICD-10-CM | POA: Diagnosis not present

## 2023-04-16 DIAGNOSIS — D571 Sickle-cell disease without crisis: Secondary | ICD-10-CM | POA: Diagnosis not present

## 2023-04-16 DIAGNOSIS — N2581 Secondary hyperparathyroidism of renal origin: Secondary | ICD-10-CM | POA: Diagnosis not present

## 2023-04-18 DIAGNOSIS — Z992 Dependence on renal dialysis: Secondary | ICD-10-CM | POA: Diagnosis not present

## 2023-04-18 DIAGNOSIS — N186 End stage renal disease: Secondary | ICD-10-CM | POA: Diagnosis not present

## 2023-04-18 DIAGNOSIS — I158 Other secondary hypertension: Secondary | ICD-10-CM | POA: Diagnosis not present

## 2023-04-23 ENCOUNTER — Ambulatory Visit (INDEPENDENT_AMBULATORY_CARE_PROVIDER_SITE_OTHER): Payer: PPO | Admitting: Family Medicine

## 2023-04-23 ENCOUNTER — Encounter: Payer: Self-pay | Admitting: Family Medicine

## 2023-04-23 VITALS — BP 90/60 | HR 94 | Temp 97.0°F | Wt 143.0 lb

## 2023-04-23 DIAGNOSIS — D5700 Hb-SS disease with crisis, unspecified: Secondary | ICD-10-CM | POA: Diagnosis not present

## 2023-04-23 DIAGNOSIS — G8929 Other chronic pain: Secondary | ICD-10-CM

## 2023-04-23 DIAGNOSIS — I10 Essential (primary) hypertension: Secondary | ICD-10-CM

## 2023-04-23 DIAGNOSIS — N186 End stage renal disease: Secondary | ICD-10-CM

## 2023-04-23 DIAGNOSIS — E559 Vitamin D deficiency, unspecified: Secondary | ICD-10-CM

## 2023-04-23 DIAGNOSIS — G894 Chronic pain syndrome: Secondary | ICD-10-CM | POA: Diagnosis not present

## 2023-04-23 DIAGNOSIS — M25561 Pain in right knee: Secondary | ICD-10-CM

## 2023-04-23 DIAGNOSIS — D571 Sickle-cell disease without crisis: Secondary | ICD-10-CM | POA: Diagnosis not present

## 2023-04-23 DIAGNOSIS — D57819 Other sickle-cell disorders with crisis, unspecified: Secondary | ICD-10-CM | POA: Diagnosis not present

## 2023-04-23 DIAGNOSIS — N2581 Secondary hyperparathyroidism of renal origin: Secondary | ICD-10-CM | POA: Diagnosis not present

## 2023-04-23 DIAGNOSIS — L299 Pruritus, unspecified: Secondary | ICD-10-CM | POA: Diagnosis not present

## 2023-04-23 DIAGNOSIS — Z992 Dependence on renal dialysis: Secondary | ICD-10-CM | POA: Diagnosis not present

## 2023-04-23 DIAGNOSIS — D57219 Sickle-cell/Hb-C disease with crisis, unspecified: Secondary | ICD-10-CM | POA: Diagnosis not present

## 2023-04-23 DIAGNOSIS — D631 Anemia in chronic kidney disease: Secondary | ICD-10-CM | POA: Diagnosis not present

## 2023-04-23 MED ORDER — OXYCODONE HCL 10 MG PO TABS
10.0000 mg | ORAL_TABLET | Freq: Four times a day (QID) | ORAL | 0 refills | Status: DC | PRN
Start: 2023-04-23 — End: 2023-05-12

## 2023-04-23 MED ORDER — OXYCODONE HCL ER 10 MG PO T12A
10.0000 mg | EXTENDED_RELEASE_TABLET | Freq: Two times a day (BID) | ORAL | 0 refills | Status: DC
Start: 2023-04-23 — End: 2023-05-12

## 2023-04-23 MED ORDER — SARNA 0.5-0.5 % EX LOTN
1.0000 | TOPICAL_LOTION | CUTANEOUS | 0 refills | Status: DC | PRN
Start: 2023-04-23 — End: 2023-06-25

## 2023-04-23 NOTE — Progress Notes (Signed)
Established Patient Office Visit  Subjective   Patient ID: Patrick [name], male    DOB: 1955/07/25  Age: 67 y.o. MRN: [id number]  Chief Complaint  Patient presents with   Sickle Cell Anemia    Follow up    Patrick [name] is a 67 year old male with a medical history significant for sickle cell disease, chronic pain syndrome, opiate dependence and tolerance, history of atrial fibrillation, end-stage renal disease, legally blind, history of gout, chronic systolic CHF presents for follow-up of chronic conditions.  Of note, patient has a primary complaint of worsening pain to bilateral lower extremities.  Pain intensity has been worsening over the past month and unrelieved by his home opiate medications of OxyContin and oxycodone.  Patient says that he has not been taking OxyContin as scheduled consistently.  Today, he rates his pain as 7/10.  He states that pain intensity has been worsening prior to the weather becoming colder, but he says that "this cold weather has made things much worse". Patrick [name] states that he has been following up with all specialist as scheduled.  He has a history of end-stage renal disease with dialysis on Tuesday, Thursday, and Saturday. Patient's primary support includes his family and friends.    Patient Active Problem List   Diagnosis Date Noted   Pain in right knee 02/06/2023   Hospital discharge follow-up 12/04/2022   Sickle cell pain crisis (HCC) 11/21/2022   GI bleed 11/20/2022   Cirrhosis (HCC) 10/21/2022   Chronic hypotension 10/21/2022   Thrombocytopenia (HCC) 10/21/2022   Wound infection 10/02/2022   Legally blind 10/02/2022   Atrial fibrillation with RVR (HCC) 07/23/2022   Hypoglycemia 07/23/2022   Cystic lesion of abdominal viscera 04/06/2022   Bile leak    Cholelithiasis 11/30/2021   Chronic pain syndrome    Persistent atrial fibrillation (HCC) 09/13/2021   Symptomatic anemia 09/13/2021   Abdominal fluid collection 08/22/2021    Gastric AVM    Hiatal hernia    History of GI bleed 06/27/2021   Visual impairment 06/27/2021   COPD (chronic obstructive pulmonary disease) (HCC) 06/27/2021   Acute gangrenous cholecystitis s/p open cholcystectomy 05/24/2021 05/27/2021   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 05/24/2021 05/27/2021   Protein-calorie malnutrition, moderate (HCC) 05/23/2021   Prolonged QT interval 08/02/2020   Chronic anticoagulation    Tobacco dependence 05/28/2020   Chronic systolic CHF (congestive heart failure) (HCC) 09/11/2019   Encounter for therapeutic drug monitoring 12/29/2015   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   Permanent atrial fibrillation (HCC) 12/24/2015   ESRD on hemodialysis (HCC) 05/06/2014   Vitamin D deficiency 09/25/2013   CKD (chronic kidney disease), stage IV (HCC) 09/25/2013   Onychomycosis of toenail 08/27/2013   Sickle cell disease (HCC) 11/21/2012   Sickle cell crisis (HCC) 01/17/2012   NSVT (nonsustained ventricular tachycardia) (HCC) 10/21/2011   Gout 10/19/2011   Past Medical History:  Diagnosis Date   A-fib (HCC)    Blood dyscrasia    Blood transfusion    "I've had a bunch of them"   CHF (congestive heart failure) (HCC)    followed by hf clinic   Chronic kidney disease    Stage 5, dialysis on Tu/Th/Sa   Dialysis patient (HCC) 04/2019   AV fistula (right arm), TTS   Dysrhythmia    A-Fib per patient   Early satiety    Elevated LFTs 04/01/2022   Elevated troponin 04/01/2022   Empyema of gallbladder 05/27/2021   GERD (gastroesophageal reflux disease)  Gout    Legally blind    Metabolic acidosis with increased anion gap and accumulation of organic acids 04/01/2022   Mitral regurgitation    Muscle tightness-Right arm  05/27/2014   Pneumonia    Pulmonary hypertension (HCC)    Shortness of breath dyspnea    Sickle cell disease, type SS (HCC)    Swelling abdomen    Swelling of extremity, left    Swelling of extremity, right    Tricuspid  regurgitation    Past Surgical History:  Procedure Laterality Date   A/V FISTULAGRAM Right 07/29/2020   Procedure: A/V FISTULAGRAM - Right Arm;  Surgeon: Chuck Hint, MD;  Location: [facility] INVASIVE CV LAB;  Service: Cardiovascular;  Laterality: Right;   BASCILIC VEIN TRANSPOSITION Right 04/12/2014   Procedure: BASILIC VEIN TRANSPOSITION;  Surgeon: Sherren Kerns, MD;  Location: [facility] OR;  Service: Vascular;  Laterality: Right;   BILIARY STENT PLACEMENT  04/04/2022   Procedure: BILIARY STENT PLACEMENT;  Surgeon: Meryl Dare, MD;  Location: [facility] ENDOSCOPY;  Service: Gastroenterology;;   BIOPSY  08/04/2020   Procedure: BIOPSY;  Surgeon: Meryl Dare, MD;  Location: [facility] ENDOSCOPY;  Service: Gastroenterology;;   BIOPSY  06/28/2021   Procedure: BIOPSY;  Surgeon: Beverley Fiedler, MD;  Location: [facility] ENDOSCOPY;  Service: Gastroenterology;;   BIOPSY  04/02/2022   Procedure: BIOPSY;  Surgeon: Lemar Lofty., MD;  Location: [facility] ENDOSCOPY;  Service: Gastroenterology;;   BIOPSY  10/22/2022   Procedure: BIOPSY;  Surgeon: Beverley Fiedler, MD;  Location: [facility] ENDOSCOPY;  Service: Gastroenterology;;   BOWEL RESECTION N/A 05/24/2021   Procedure: SMALL BOWEL RESECTION;  Surgeon: Emelia Loron, MD;  Location: [facility] OR;  Service: General;  Laterality: N/A;   CARDIOVERSION N/A 06/05/2018   Procedure: CARDIOVERSION;  Surgeon: Dolores Patty, MD;  Location: [facility] ENDOSCOPY;  Service: Cardiovascular;  Laterality: N/A;   CARDIOVERSION N/A 10/06/2019   Procedure: CARDIOVERSION;  Surgeon: Dolores Patty, MD;  Location: [facility] ENDOSCOPY;  Service: Cardiovascular;  Laterality: N/A;   CHOLECYSTECTOMY N/A 05/24/2021   Procedure: OPEN CHOLECYSTECTOMY;  Surgeon: Emelia Loron, MD;  Location: [facility] OR;  Service: General;  Laterality: N/A;   COLONOSCOPY WITH PROPOFOL N/A 09/24/2017   Procedure: COLONOSCOPY WITH PROPOFOL;  Surgeon: Lynann Bologna, MD;  Location: [facility] ENDOSCOPY;  Service: Endoscopy;  Laterality:  N/A;   ENDOSCOPIC RETROGRADE CHOLANGIOPANCREATOGRAPHY (ERCP) WITH PROPOFOL N/A 05/17/2022   Procedure: ENDOSCOPIC RETROGRADE CHOLANGIOPANCREATOGRAPHY (ERCP) WITH PROPOFOL;  Surgeon: Meridee Score Netty Starring., MD;  Location: [facility] ENDOSCOPY;  Service: Gastroenterology;  Laterality: N/A;   ERCP N/A 04/04/2022   Procedure: ENDOSCOPIC RETROGRADE CHOLANGIOPANCREATOGRAPHY (ERCP);  Surgeon: Meryl Dare, MD;  Location: [facility] ENDOSCOPY;  Service: Gastroenterology;  Laterality: N/A;   ESOPHAGOGASTRODUODENOSCOPY N/A 09/19/2017   Procedure: ESOPHAGOGASTRODUODENOSCOPY (EGD);  Surgeon: Lynann Bologna, MD;  Location: [facility] ENDOSCOPY;  Service: Endoscopy;  Laterality: N/A;   ESOPHAGOGASTRODUODENOSCOPY N/A 05/29/2021   Procedure: ESOPHAGOGASTRODUODENOSCOPY (EGD);  Surgeon: Rachael Fee, MD;  Location: [facility] ENDOSCOPY;  Service: Gastroenterology;  Laterality: N/A;   ESOPHAGOGASTRODUODENOSCOPY (EGD) WITH PROPOFOL N/A 08/04/2020   Procedure: ESOPHAGOGASTRODUODENOSCOPY (EGD) WITH PROPOFOL;  Surgeon: Meryl Dare, MD;  Location: [facility] ENDOSCOPY;  Service: Gastroenterology;  Laterality: N/A;   ESOPHAGOGASTRODUODENOSCOPY (EGD) WITH PROPOFOL N/A 06/28/2021   Procedure: ESOPHAGOGASTRODUODENOSCOPY (EGD) WITH PROPOFOL;  Surgeon: Beverley Fiedler, MD;  Location: [facility] ENDOSCOPY;  Service: Gastroenterology;  Laterality: N/A;   ESOPHAGOGASTRODUODENOSCOPY (EGD) WITH PROPOFOL N/A 08/10/2021   Procedure: ESOPHAGOGASTRODUODENOSCOPY (EGD) WITH PROPOFOL;  Surgeon: Lynann Bologna, MD;  Location: [facility] ENDOSCOPY;  Service: Gastroenterology;  Laterality: N/A;   ESOPHAGOGASTRODUODENOSCOPY (EGD) WITH PROPOFOL N/A 08/02/2021   Procedure: ESOPHAGOGASTRODUODENOSCOPY (EGD) WITH PROPOFOL;  Surgeon: Shellia Cleverly, DO;  Location: [facility] ENDOSCOPY;  Service: Gastroenterology;  Laterality: N/A;   ESOPHAGOGASTRODUODENOSCOPY (EGD) WITH PROPOFOL N/A 04/02/2022   Procedure: ESOPHAGOGASTRODUODENOSCOPY (EGD) WITH PROPOFOL;  Surgeon: Meridee Score Netty Starring., MD;   Location: [facility] ENDOSCOPY;  Service: Gastroenterology;  Laterality: N/A;   ESOPHAGOGASTRODUODENOSCOPY (EGD) WITH PROPOFOL N/A 05/17/2022   Procedure: ESOPHAGOGASTRODUODENOSCOPY (EGD) WITH PROPOFOL;  Surgeon: Meridee Score Netty Starring., MD;  Location: [facility] ENDOSCOPY;  Service: Gastroenterology;  Laterality: N/A;   ESOPHAGOGASTRODUODENOSCOPY (EGD) WITH PROPOFOL N/A 10/22/2022   Procedure: ESOPHAGOGASTRODUODENOSCOPY (EGD) WITH PROPOFOL;  Surgeon: Beverley Fiedler, MD;  Location: [facility] ENDOSCOPY;  Service: Gastroenterology;  Laterality: N/A;   EUS N/A 05/17/2022   Procedure: UPPER ENDOSCOPIC ULTRASOUND (EUS) RADIAL;  Surgeon: Lemar Lofty., MD;  Location: [facility] ENDOSCOPY;  Service: Gastroenterology;  Laterality: N/A;   FINE NEEDLE ASPIRATION  04/02/2022   Procedure: FINE NEEDLE ASPIRATION (FNA) LINEAR;  Surgeon: Lemar Lofty., MD;  Location: [facility] ENDOSCOPY;  Service: Gastroenterology;;   FINE NEEDLE ASPIRATION N/A 05/17/2022   Procedure: FINE NEEDLE ASPIRATION (FNA) LINEAR;  Surgeon: Lemar Lofty., MD;  Location: [facility] ENDOSCOPY;  Service: Gastroenterology;  Laterality: N/A;   HEMOSTASIS CLIP PLACEMENT  05/29/2021   Procedure: HEMOSTASIS CLIP PLACEMENT;  Surgeon: Rachael Fee, MD;  Location: [facility] ENDOSCOPY;  Service: Gastroenterology;;   HOT HEMOSTASIS N/A 05/29/2021   Procedure: HOT HEMOSTASIS (ARGON PLASMA COAGULATION/BICAP);  Surgeon: Rachael Fee, MD;  Location: [facility] ENDOSCOPY;  Service: Gastroenterology;  Laterality: N/A;   HOT HEMOSTASIS N/A 06/28/2021   Procedure: HOT HEMOSTASIS (ARGON PLASMA COAGULATION/BICAP);  Surgeon: Beverley Fiedler, MD;  Location: [facility] ENDOSCOPY;  Service: Gastroenterology;  Laterality: N/A;   HOT HEMOSTASIS N/A 08/10/2021   Procedure: HOT HEMOSTASIS (ARGON PLASMA COAGULATION/BICAP);  Surgeon: Lynann Bologna, MD;  Location: [facility] ENDOSCOPY;  Service: Gastroenterology;  Laterality: N/A;   HOT HEMOSTASIS N/A 08/02/2021   Procedure: HOT HEMOSTASIS (ARGON PLASMA  COAGULATION/BICAP);  Surgeon: Shellia Cleverly, DO;  Location: [facility] ENDOSCOPY;  Service: Gastroenterology;  Laterality: N/A;   IR PARACENTESIS  07/04/2022   IR PERC TUN PERIT CATH WO PORT S&I /IMAG  05/23/2021   IR REMOVAL TUN CV CATH W/O FL  06/10/2021   IR US GUIDE VASC ACCESS RIGHT  05/23/2021   LAPAROSCOPY N/A 05/24/2021   Procedure: LAPAROSCOPY DIAGNOSTIC;  Surgeon: Emelia Loron, MD;  Location: [facility] OR;  Service: General;  Laterality: N/A;   LAPAROTOMY N/A 05/24/2021   Procedure: EXPLORATORY LAPAROTOMY;  Surgeon: Emelia Loron, MD;  Location: [facility] OR;  Service: General;  Laterality: N/A;   LEFT AND RIGHT HEART CATHETERIZATION WITH CORONARY ANGIOGRAM N/A 06/11/2011   Procedure: LEFT AND RIGHT HEART CATHETERIZATION WITH CORONARY ANGIOGRAM;  Surgeon: Laurey Morale, MD;  Location: [facility] CATH LAB;  Service: Cardiovascular;  Laterality: N/A;   PERIPHERAL VASCULAR BALLOON ANGIOPLASTY Right 07/29/2020   Procedure: PERIPHERAL VASCULAR BALLOON ANGIOPLASTY;  Surgeon: Chuck Hint, MD;  Location: [facility] INVASIVE CV LAB;  Service: Cardiovascular;  Laterality: Right;  arm fistula   REMOVAL OF STONES  05/17/2022   Procedure: REMOVAL OF STONES;  Surgeon: Meridee Score Netty Starring., MD;  Location: [facility] ENDOSCOPY;  Service: Gastroenterology;;   REVISON OF ARTERIOVENOUS FISTULA Right 08/08/2020   Procedure: ANEURYSM EXCISION OF RIGHT UPPER EXTREMITY ARTERIOVENOUS FISTULA;  Surgeon: Chuck Hint, MD;  Location: [facility] OR;  Service: Vascular;  Laterality: Right;   REVISON OF ARTERIOVENOUS FISTULA Right 03/21/2022  Procedure: EXCISION OF ULCERATED SKIN OF RIGHT ARTERIOVENOUS FISTULA REVISION;  Surgeon: Cephus Shelling, MD;  Location: [facility] OR;  Service: Vascular;  Laterality: Right;   REVISON OF ARTERIOVENOUS FISTULA Right 07/02/2022   Procedure: EXCISION OF ULCERS RIGHT ARTERIOVENOUS FISTULA;  Surgeon: Chuck Hint, MD;  Location: [facility] OR;  Service: Vascular;  Laterality: Right;    SCLEROTHERAPY  05/29/2021   Procedure: Susa Day;  Surgeon: Rachael Fee, MD;  Location: [facility] ENDOSCOPY;  Service: Gastroenterology;;   Dennison Mascot  04/04/2022   Procedure: Dennison Mascot;  Surgeon: Meryl Dare, MD;  Location: [facility] ENDOSCOPY;  Service: Gastroenterology;;   Francine Graven REMOVAL  05/29/2021   Procedure: STENT REMOVAL;  Surgeon: Rachael Fee, MD;  Location: [facility] ENDOSCOPY;  Service: Gastroenterology;;   Francine Graven REMOVAL  05/17/2022   Procedure: STENT REMOVAL;  Surgeon: Lemar Lofty., MD;  Location: [facility] ENDOSCOPY;  Service: Gastroenterology;;   TEE WITHOUT CARDIOVERSION N/A 12/27/2015   Procedure: TRANSESOPHAGEAL ECHOCARDIOGRAM (TEE);  Surgeon: Quintella Reichert, MD;  Location: [facility] ENDOSCOPY;  Service: Cardiovascular;  Laterality: N/A;   UPPER ESOPHAGEAL ENDOSCOPIC ULTRASOUND (EUS) N/A 04/02/2022   Procedure: UPPER ESOPHAGEAL ENDOSCOPIC ULTRASOUND (EUS);  Surgeon: Lemar Lofty., MD;  Location: [facility] ENDOSCOPY;  Service: Gastroenterology;  Laterality: N/A;   Social History   Tobacco Use   Smoking status: Former    Current packs/day: 0.00    Average packs/day: 0.5 packs/day for 41.0 years (20.5 ttl pk-yrs)    Types: Cigarettes    Start date: 12/08/1974    Quit date: 12/08/2015    Years since quitting: 7.3   Smokeless tobacco: Never  Vaping Use   Vaping status: Never Used  Substance Use Topics   Alcohol use: Not Currently    Alcohol/week: 3.0 standard drinks of alcohol    Types: 1 Glasses of wine, 2 Cans of beer per week    Comment: none since starting dialysis 06/2021   Drug use: No   Social History   Socioeconomic History   Marital status: Divorced    Spouse name: Not on file   Number of children: Not on file   Years of education: Not on file   Highest education level: Not on file  Occupational History   Occupation: disabled  Tobacco Use   Smoking status: Former    Current packs/day: 0.00    Average packs/day: 0.5 packs/day for 41.0 years (20.5  ttl pk-yrs)    Types: Cigarettes    Start date: 12/08/1974    Quit date: 12/08/2015    Years since quitting: 7.3   Smokeless tobacco: Never  Vaping Use   Vaping status: Never Used  Substance and Sexual Activity   Alcohol use: Not Currently    Alcohol/week: 3.0 standard drinks of alcohol    Types: 1 Glasses of wine, 2 Cans of beer per week    Comment: none since starting dialysis 06/2021   Drug use: No   Sexual activity: Not Currently  Other Topics Concern   Not on file  Social History Narrative   Not on file   Social Determinants of Health   Financial Resource Strain: Low Risk  (09/27/2022)   Overall Financial Resource Strain (CARDIA)    Difficulty of Paying Living Expenses: Not hard at all  Food Insecurity: No Food Insecurity (11/28/2022)   Hunger Vital Sign    Worried About Running Out of Food in the Last Year: Never true    Ran Out of Food in the Last Year: Never true  Transportation Needs: No Transportation  Needs (11/28/2022)   PRAPARE - Administrator, Civil Service (Medical): No    Lack of Transportation (Non-Medical): No  Physical Activity: Inactive (09/27/2022)   Exercise Vital Sign    Days of Exercise per Week: 0 days    Minutes of Exercise per Session: 0 min  Stress: No Stress Concern Present (09/27/2022)   [employer] of Occupational Health - Occupational Stress Questionnaire    Feeling of Stress : Not at all  Social Connections: Socially Isolated (09/27/2022)   Social Connection and Isolation Panel [NHANES]    Frequency of Communication with Friends and Family: More than three times a week    Frequency of Social Gatherings with Friends and Family: More than three times a week    Attends Religious Services: Never    Database administrator or Organizations: No    Attends Banker Meetings: Never    Marital Status: Divorced  Catering manager Violence: Not At Risk (11/20/2022)   Humiliation, Afraid, Rape, and Kick questionnaire    Fear of  Current or Ex-Partner: No    Emotionally Abused: No    Physically Abused: No    Sexually Abused: No   Family Status  Relation Name Status   Mother  Deceased   Father  Deceased  No partnership data on file   Family History  Problem Relation Age of Onset   Alcohol abuse Mother    Liver disease Mother    Cirrhosis Mother    Heart attack Mother    No Known Allergies    Review of Systems  Constitutional: Negative.   HENT: Negative.    Respiratory: Negative.    Gastrointestinal: Negative.   Genitourinary: Negative.   Musculoskeletal:  Positive for back pain and joint pain.  Skin: Negative.   Neurological: Negative.   Psychiatric/Behavioral: Negative.        Objective:     BP 90/60   Pulse 94   Temp (!) 97 F (36.1 C)   Wt 143 lb (64.9 kg)   SpO2 97%   BMI 19.94 kg/m  BP Readings from Last 3 Encounters:  04/23/23 90/60  12/04/22 (!) 96/55  11/27/22 102/65   Wt Readings from Last 3 Encounters:  04/23/23 143 lb (64.9 kg)  12/04/22 141 lb (64 kg)  11/24/22 142 lb 13.7 oz (64.8 kg)      Physical Exam HENT:     Mouth/Throat:     Mouth: Mucous membranes are moist.     Pharynx: Oropharynx is clear.  Cardiovascular:     Rate and Rhythm: Normal rate. Rhythm irregular.     Heart sounds: Murmur heard.  Pulmonary:     Effort: Pulmonary effort is normal.  Abdominal:     General: Bowel sounds are normal.  Skin:    General: Skin is warm.  Neurological:     General: No focal deficit present.     Mental Status: He is alert. Mental status is at baseline.  Psychiatric:        Mood and Affect: Mood normal.        Behavior: Behavior normal.        Thought Content: Thought content normal.        Judgment: Judgment normal.      No results found for any visits on 04/23/23.  Last CBC Lab Results  Component Value Date   WBC 3.6 12/04/2022   HGB 8.9 (L) 12/04/2022   HCT 25.1 (L) 12/04/2022   MCV 97 12/04/2022  MCH 34.5 (H) 12/04/2022   RDW 20.2 (H)  12/04/2022   PLT 128 (L) 12/04/2022   Last metabolic panel Lab Results  Component Value Date   GLUCOSE 84 11/27/2022   NA 126 (L) 11/27/2022   K 4.5 11/27/2022   CL 90 (L) 11/27/2022   CO2 24 11/27/2022   BUN 68 (H) 11/27/2022   CREATININE 8.61 (H) 11/27/2022   GFRNONAA 6 (L) 11/27/2022   CALCIUM 8.6 (L) 11/27/2022   PHOS 4.2 11/27/2022   PROT 6.9 11/26/2022   ALBUMIN 2.9 (L) 11/27/2022   LABGLOB 3.2 08/01/2022   AGRATIO 1.3 08/01/2022   BILITOT 1.1 11/26/2022   ALKPHOS 187 (H) 11/26/2022   AST 20 11/26/2022   ALT 20 11/26/2022   ANIONGAP 12 11/27/2022   Last lipids Lab Results  Component Value Date   CHOL 114 (L) 02/17/2015   HDL 31 (L) 02/17/2015   LDLCALC 68 02/17/2015   TRIG 42 06/05/2021   CHOLHDL 3.7 02/17/2015   Last hemoglobin A1c Lab Results  Component Value Date   HGBA1C 4.9 05/25/2021   Last thyroid functions Lab Results  Component Value Date   TSH 7.919 (H) 07/24/2022   Last vitamin D Lab Results  Component Value Date   VD25OH 96.3 11/28/2021   Last vitamin B12 and Folate Lab Results  Component Value Date   VITAMINB12 596 07/24/2022   FOLATE 35.4 07/24/2022      The ASCVD Risk score (Arnett DK, et al., 2019) failed to calculate for the following reasons:   Cannot find a previous HDL lab   Cannot find a previous total cholesterol lab    Assessment & Plan:   Problem List Items Addressed This Visit       Other   Vitamin D deficiency   Pain in right knee   Chronic pain syndrome   Relevant Orders   [id number] 11+Oxyco+Alc+Crt-Bund   Sickle Cell Panel   Other Visit Diagnoses     Hb-SS disease without crisis (HCC)    -  Primary   Relevant Orders   Sickle Cell Panel   Essential hypertension       End stage renal disease (HCC)       Pruritic condition       Relevant Medications   camphor-menthol (SARNA) lotion     1. Hb-SS disease without crisis [facility]) Reviewed PDMP substance reporting system prior to prescribing opiate  medications. No inconsistencies noted.   - Sickle Cell Panel - oxyCODONE (OXYCONTIN) 10 mg 12 hr tablet; Take 1 tablet (10 mg total) by mouth every 12 (twelve) hours.  Dispense: 30 tablet; Refill: 0 - Oxycodone HCl 10 MG TABS; Take 1 tablet (10 mg total) by mouth every 6 (six) hours as needed.  Dispense: 60 tablet; Refill: 0  2. Chronic pain syndrome  - [id number] 11+Oxyco+Alc+Crt-Bund    - oxyCODONE (OXYCONTIN) 10 mg 12 hr tablet; Take 1 tablet (10 mg total) by mouth every 12 (twelve) hours.  Dispense: 30 tablet; Refill: 0  3. Vitamin D deficiency - Sickle Cell Panel  4. Chronic pain of right knee  Reviewed PDMP substance reporting system prior to prescribing opiate medications. No inconsistencies noted.   5. Essential hypertension BP 90/60   Pulse 94   Temp (!) 97 F (36.1 C)   Wt 143 lb (64.9 kg)   SpO2 97%   BMI 19.94 kg/m  - Continue medication, monitor blood pressure at home. Continue DASH diet.  Reminder to go to the ER if any CP,  SOB, nausea, dizziness, severe HA, changes vision/speech, left arm numbness and tingling and jaw pain.    6. End stage renal disease (HCC) Continue dialysis as scheduled  7. Pruritic condition  - camphor-menthol (SARNA) lotion; Apply 1 Application topically as needed for itching.  Dispense: 222 mL; Refill: 0    Follow-up in 3 months as scheduled.  Utilize sickle cell day infusion clinic for management of acute sickle cell pain crisis.  Nolon Nations  APRN, MSN, FNP-C Patient Care [facility] [address] [place], [place] [address] [phone]

## 2023-04-25 ENCOUNTER — Encounter: Payer: Self-pay | Admitting: Radiology

## 2023-04-25 ENCOUNTER — Ambulatory Visit: Payer: PPO | Admitting: Physician Assistant

## 2023-04-25 DIAGNOSIS — M25561 Pain in right knee: Secondary | ICD-10-CM

## 2023-04-25 DIAGNOSIS — G8929 Other chronic pain: Secondary | ICD-10-CM | POA: Diagnosis not present

## 2023-04-25 NOTE — Progress Notes (Signed)
Office Visit Note   Patient: Patrick [name]           Date of Birth: Dec 18, 1955           MRN: [id number] Visit Date: 04/25/2023              Requested by: Quentin Angst, MD [address] [place],  [place] [address] PCP: Quentin Angst, MD  Chief Complaint  Patient presents with   Right Knee - Follow-up    02/25/23 injection/aspiration      HPI: Patrick [name] is a pleasant 67 year old gentleman with a history of right knee pain and arthritis.  He also has a history of sickle cell disease and kidney failure.  He does dialysis 3 times a week.  At his last visit we did try a steroid injection he does not think it helped for any long period of time.  Has had no new injury has very limited vision  Assessment & Plan: Visit Diagnoses:  1. Chronic pain of right knee     Plan: On exam today he does have significant quad atrophy especially when compared with the left side.  I did teach him a close chain exercise he can do but I think he do well to engage with physical therapy would be willing to do this may follow-up with me if he does not improve  Follow-Up Instructions: As needed  Ortho Exam  Patient is alert, oriented, no adenopathy, well-dressed, normal affect, normal respiratory effort. Right knee no erythema compartments are soft and compressible he can sustain a straight leg raise he has quite a bit of crepitus over the patellofemoral joint with range of motion he has good stability he has significant quad atrophy which is much more profound on the right than the left compartments are soft and nontender negative Denna Haggard' sign  Imaging: No results found. No images are attached to the encounter.  Labs: Lab Results  Component Value Date   HGBA1C 4.9 05/25/2021   HGBA1C <4.0 06/07/2011   LABURIC 5.8 05/31/2017   LABURIC 8.4 (H) 05/26/2014   LABURIC 12.0 (H) 09/17/2013   REPTSTATUS 07/25/2022 FINAL 07/25/2022   GRAMSTAIN  07/04/2022    WBC PRESENT, PREDOMINANTLY  MONONUCLEAR NO ORGANISMS SEEN CYTOSPIN SMEAR Performed at [facility], [address] [place], [place] [address]    CULT  07/24/2022    NO GROWTH 5 DAYS Performed at [facility], [address], [place], [place] [address]    [facility] ESCHERICHIA COLI 08/06/2021     Lab Results  Component Value Date   ALBUMIN 2.9 (L) 11/27/2022   ALBUMIN 3.0 (L) 11/26/2022   ALBUMIN 2.9 (L) 11/24/2022   PREALBUMIN 18 07/24/2022    Lab Results  Component Value Date   MG 2.1 10/21/2022   MG 2.1 07/24/2022   MG 2.1 07/24/2022   Lab Results  Component Value Date   VD25OH 96.3 11/28/2021   VD25OH 13.7 (L) 05/24/2020   VD25OH 11.4 (L) 12/29/2019    Lab Results  Component Value Date   PREALBUMIN 18 07/24/2022      Latest Ref Rng & Units 12/04/2022   11:16 AM 11/27/2022    7:45 AM 11/26/2022    3:50 AM  CBC EXTENDED  WBC 3.4 - 10.8 x10E3/uL 3.6  4.6  5.1   RBC 4.14 - 5.80 x10E6/uL 2.58  2.34  2.51    2.49   Hemoglobin 13.0 - 17.7 g/dL 8.9  8.3  8.7  HCT 37.5 - 51.0 % 25.1  23.5  25.1   Platelets 150 - 450 x10E3/uL 128  122  127   NEUT# 1.4 - 7.0 x10E3/uL 2.3     Lymph# 0.7 - 3.1 x10E3/uL 0.5        There is no height or weight on file to calculate BMI.  Orders:  No orders of the defined types were placed in this encounter.  No orders of the defined types were placed in this encounter.    Procedures: No procedures performed  Clinical Data: No additional findings.  ROS:  All other systems negative, except as noted in the HPI. Review of Systems  Objective: Vital Signs: There were no vitals taken for this visit.  Specialty Comments:  No specialty comments available.  PMFS History: Patient Active Problem List   Diagnosis Date Noted   Pain in right knee 02/06/2023   Hospital discharge follow-up 12/04/2022   Sickle cell pain crisis (HCC) 11/21/2022   GI bleed 11/20/2022   Cirrhosis (HCC) 10/21/2022   Chronic hypotension 10/21/2022   Thrombocytopenia  (HCC) 10/21/2022   Wound infection 10/02/2022   Legally blind 10/02/2022   Atrial fibrillation with RVR (HCC) 07/23/2022   Hypoglycemia 07/23/2022   Cystic lesion of abdominal viscera 04/06/2022   Bile leak    Cholelithiasis 11/30/2021   Chronic pain syndrome    Persistent atrial fibrillation (HCC) 09/13/2021   Symptomatic anemia 09/13/2021   Abdominal fluid collection 08/22/2021   Gastric AVM    Hiatal hernia    History of GI bleed 06/27/2021   Visual impairment 06/27/2021   COPD (chronic obstructive pulmonary disease) (HCC) 06/27/2021   Acute gangrenous cholecystitis s/p open cholcystectomy 05/24/2021 05/27/2021   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 05/24/2021 05/27/2021   Protein-calorie malnutrition, moderate (HCC) 05/23/2021   Prolonged QT interval 08/02/2020   Chronic anticoagulation    Tobacco dependence 05/28/2020   Chronic systolic CHF (congestive heart failure) (HCC) 09/11/2019   Encounter for therapeutic drug monitoring 12/29/2015   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   Permanent atrial fibrillation (HCC) 12/24/2015   ESRD on hemodialysis (HCC) 05/06/2014   Vitamin D deficiency 09/25/2013   CKD (chronic kidney disease), stage IV (HCC) 09/25/2013   Onychomycosis of toenail 08/27/2013   Sickle cell disease (HCC) 11/21/2012   Sickle cell crisis (HCC) 01/17/2012   NSVT (nonsustained ventricular tachycardia) (HCC) 10/21/2011   Gout 10/19/2011   Past Medical History:  Diagnosis Date   A-fib (HCC)    Blood dyscrasia    Blood transfusion    "I've had a bunch of them"   CHF (congestive heart failure) (HCC)    followed by hf clinic   Chronic kidney disease    Stage 5, dialysis on Tu/Th/Sa   Dialysis patient (HCC) 04/2019   AV fistula (right arm), TTS   Dysrhythmia    A-Fib per patient   Early satiety    Elevated LFTs 04/01/2022   Elevated troponin 04/01/2022   Empyema of gallbladder 05/27/2021   GERD (gastroesophageal reflux disease)    Gout     Legally blind    Metabolic acidosis with increased anion gap and accumulation of organic acids 04/01/2022   Mitral regurgitation    Muscle tightness-Right arm  05/27/2014   Pneumonia    Pulmonary hypertension (HCC)    Shortness of breath dyspnea    Sickle cell disease, type SS (HCC)    Swelling abdomen    Swelling of extremity, left    Swelling of extremity,  right    Tricuspid regurgitation     Family History  Problem Relation Age of Onset   Alcohol abuse Mother    Liver disease Mother    Cirrhosis Mother    Heart attack Mother     Past Surgical History:  Procedure Laterality Date   A/V FISTULAGRAM Right 07/29/2020   Procedure: A/V FISTULAGRAM - Right Arm;  Surgeon: Chuck Hint, MD;  Location: [facility] INVASIVE CV LAB;  Service: Cardiovascular;  Laterality: Right;   BASCILIC VEIN TRANSPOSITION Right 04/12/2014   Procedure: BASILIC VEIN TRANSPOSITION;  Surgeon: Sherren Kerns, MD;  Location: [facility] OR;  Service: Vascular;  Laterality: Right;   BILIARY STENT PLACEMENT  04/04/2022   Procedure: BILIARY STENT PLACEMENT;  Surgeon: Meryl Dare, MD;  Location: [facility] ENDOSCOPY;  Service: Gastroenterology;;   BIOPSY  08/04/2020   Procedure: BIOPSY;  Surgeon: Meryl Dare, MD;  Location: [facility] ENDOSCOPY;  Service: Gastroenterology;;   BIOPSY  06/28/2021   Procedure: BIOPSY;  Surgeon: Beverley Fiedler, MD;  Location: [facility] ENDOSCOPY;  Service: Gastroenterology;;   BIOPSY  04/02/2022   Procedure: BIOPSY;  Surgeon: Lemar Lofty., MD;  Location: [facility] ENDOSCOPY;  Service: Gastroenterology;;   BIOPSY  10/22/2022   Procedure: BIOPSY;  Surgeon: Beverley Fiedler, MD;  Location: [facility] ENDOSCOPY;  Service: Gastroenterology;;   BOWEL RESECTION N/A 05/24/2021   Procedure: SMALL BOWEL RESECTION;  Surgeon: Emelia Loron, MD;  Location: [facility] OR;  Service: General;  Laterality: N/A;   CARDIOVERSION N/A 06/05/2018   Procedure: CARDIOVERSION;  Surgeon: Dolores Patty, MD;  Location: [facility] ENDOSCOPY;   Service: Cardiovascular;  Laterality: N/A;   CARDIOVERSION N/A 10/06/2019   Procedure: CARDIOVERSION;  Surgeon: Dolores Patty, MD;  Location: [facility] ENDOSCOPY;  Service: Cardiovascular;  Laterality: N/A;   CHOLECYSTECTOMY N/A 05/24/2021   Procedure: OPEN CHOLECYSTECTOMY;  Surgeon: Emelia Loron, MD;  Location: [facility] OR;  Service: General;  Laterality: N/A;   COLONOSCOPY WITH PROPOFOL N/A 09/24/2017   Procedure: COLONOSCOPY WITH PROPOFOL;  Surgeon: Lynann Bologna, MD;  Location: [facility] ENDOSCOPY;  Service: Endoscopy;  Laterality: N/A;   ENDOSCOPIC RETROGRADE CHOLANGIOPANCREATOGRAPHY (ERCP) WITH PROPOFOL N/A 05/17/2022   Procedure: ENDOSCOPIC RETROGRADE CHOLANGIOPANCREATOGRAPHY (ERCP) WITH PROPOFOL;  Surgeon: Meridee Score Netty Starring., MD;  Location: [facility] ENDOSCOPY;  Service: Gastroenterology;  Laterality: N/A;   ERCP N/A 04/04/2022   Procedure: ENDOSCOPIC RETROGRADE CHOLANGIOPANCREATOGRAPHY (ERCP);  Surgeon: Meryl Dare, MD;  Location: [facility] ENDOSCOPY;  Service: Gastroenterology;  Laterality: N/A;   ESOPHAGOGASTRODUODENOSCOPY N/A 09/19/2017   Procedure: ESOPHAGOGASTRODUODENOSCOPY (EGD);  Surgeon: Lynann Bologna, MD;  Location: [facility] ENDOSCOPY;  Service: Endoscopy;  Laterality: N/A;   ESOPHAGOGASTRODUODENOSCOPY N/A 05/29/2021   Procedure: ESOPHAGOGASTRODUODENOSCOPY (EGD);  Surgeon: Rachael Fee, MD;  Location: [facility] ENDOSCOPY;  Service: Gastroenterology;  Laterality: N/A;   ESOPHAGOGASTRODUODENOSCOPY (EGD) WITH PROPOFOL N/A 08/04/2020   Procedure: ESOPHAGOGASTRODUODENOSCOPY (EGD) WITH PROPOFOL;  Surgeon: Meryl Dare, MD;  Location: [facility] ENDOSCOPY;  Service: Gastroenterology;  Laterality: N/A;   ESOPHAGOGASTRODUODENOSCOPY (EGD) WITH PROPOFOL N/A 06/28/2021   Procedure: ESOPHAGOGASTRODUODENOSCOPY (EGD) WITH PROPOFOL;  Surgeon: Beverley Fiedler, MD;  Location: [facility] ENDOSCOPY;  Service: Gastroenterology;  Laterality: N/A;   ESOPHAGOGASTRODUODENOSCOPY (EGD) WITH PROPOFOL N/A 08/10/2021   Procedure:  ESOPHAGOGASTRODUODENOSCOPY (EGD) WITH PROPOFOL;  Surgeon: Lynann Bologna, MD;  Location: [facility] ENDOSCOPY;  Service: Gastroenterology;  Laterality: N/A;   ESOPHAGOGASTRODUODENOSCOPY (EGD) WITH PROPOFOL N/A 08/02/2021   Procedure: ESOPHAGOGASTRODUODENOSCOPY (EGD) WITH PROPOFOL;  Surgeon: Shellia Cleverly, DO;  Location: [facility] ENDOSCOPY;  Service: Gastroenterology;  Laterality: N/A;   ESOPHAGOGASTRODUODENOSCOPY (EGD) WITH  PROPOFOL N/A 04/02/2022   Procedure: ESOPHAGOGASTRODUODENOSCOPY (EGD) WITH PROPOFOL;  Surgeon: Meridee Score Netty Starring., MD;  Location: [facility] ENDOSCOPY;  Service: Gastroenterology;  Laterality: N/A;   ESOPHAGOGASTRODUODENOSCOPY (EGD) WITH PROPOFOL N/A 05/17/2022   Procedure: ESOPHAGOGASTRODUODENOSCOPY (EGD) WITH PROPOFOL;  Surgeon: Meridee Score Netty Starring., MD;  Location: [facility] ENDOSCOPY;  Service: Gastroenterology;  Laterality: N/A;   ESOPHAGOGASTRODUODENOSCOPY (EGD) WITH PROPOFOL N/A 10/22/2022   Procedure: ESOPHAGOGASTRODUODENOSCOPY (EGD) WITH PROPOFOL;  Surgeon: Beverley Fiedler, MD;  Location: [facility] ENDOSCOPY;  Service: Gastroenterology;  Laterality: N/A;   EUS N/A 05/17/2022   Procedure: UPPER ENDOSCOPIC ULTRASOUND (EUS) RADIAL;  Surgeon: Lemar Lofty., MD;  Location: [facility] ENDOSCOPY;  Service: Gastroenterology;  Laterality: N/A;   FINE NEEDLE ASPIRATION  04/02/2022   Procedure: FINE NEEDLE ASPIRATION (FNA) LINEAR;  Surgeon: Lemar Lofty., MD;  Location: [facility] ENDOSCOPY;  Service: Gastroenterology;;   FINE NEEDLE ASPIRATION N/A 05/17/2022   Procedure: FINE NEEDLE ASPIRATION (FNA) LINEAR;  Surgeon: Lemar Lofty., MD;  Location: [facility] ENDOSCOPY;  Service: Gastroenterology;  Laterality: N/A;   HEMOSTASIS CLIP PLACEMENT  05/29/2021   Procedure: HEMOSTASIS CLIP PLACEMENT;  Surgeon: Rachael Fee, MD;  Location: [facility] ENDOSCOPY;  Service: Gastroenterology;;   HOT HEMOSTASIS N/A 05/29/2021   Procedure: HOT HEMOSTASIS (ARGON PLASMA COAGULATION/BICAP);  Surgeon: Rachael Fee, MD;   Location: [facility] ENDOSCOPY;  Service: Gastroenterology;  Laterality: N/A;   HOT HEMOSTASIS N/A 06/28/2021   Procedure: HOT HEMOSTASIS (ARGON PLASMA COAGULATION/BICAP);  Surgeon: Beverley Fiedler, MD;  Location: [facility] ENDOSCOPY;  Service: Gastroenterology;  Laterality: N/A;   HOT HEMOSTASIS N/A 08/10/2021   Procedure: HOT HEMOSTASIS (ARGON PLASMA COAGULATION/BICAP);  Surgeon: Lynann Bologna, MD;  Location: [facility] ENDOSCOPY;  Service: Gastroenterology;  Laterality: N/A;   HOT HEMOSTASIS N/A 08/02/2021   Procedure: HOT HEMOSTASIS (ARGON PLASMA COAGULATION/BICAP);  Surgeon: Shellia Cleverly, DO;  Location: [facility] ENDOSCOPY;  Service: Gastroenterology;  Laterality: N/A;   IR PARACENTESIS  07/04/2022   IR PERC TUN PERIT CATH WO PORT S&I /IMAG  05/23/2021   IR REMOVAL TUN CV CATH W/O FL  06/10/2021   IR US GUIDE VASC ACCESS RIGHT  05/23/2021   LAPAROSCOPY N/A 05/24/2021   Procedure: LAPAROSCOPY DIAGNOSTIC;  Surgeon: Emelia Loron, MD;  Location: [facility] OR;  Service: General;  Laterality: N/A;   LAPAROTOMY N/A 05/24/2021   Procedure: EXPLORATORY LAPAROTOMY;  Surgeon: Emelia Loron, MD;  Location: [facility] OR;  Service: General;  Laterality: N/A;   LEFT AND RIGHT HEART CATHETERIZATION WITH CORONARY ANGIOGRAM N/A 06/11/2011   Procedure: LEFT AND RIGHT HEART CATHETERIZATION WITH CORONARY ANGIOGRAM;  Surgeon: Laurey Morale, MD;  Location: [facility] CATH LAB;  Service: Cardiovascular;  Laterality: N/A;   PERIPHERAL VASCULAR BALLOON ANGIOPLASTY Right 07/29/2020   Procedure: PERIPHERAL VASCULAR BALLOON ANGIOPLASTY;  Surgeon: Chuck Hint, MD;  Location: [facility] INVASIVE CV LAB;  Service: Cardiovascular;  Laterality: Right;  arm fistula   REMOVAL OF STONES  05/17/2022   Procedure: REMOVAL OF STONES;  Surgeon: Meridee Score Netty Starring., MD;  Location: [facility] ENDOSCOPY;  Service: Gastroenterology;;   REVISON OF ARTERIOVENOUS FISTULA Right 08/08/2020   Procedure: ANEURYSM EXCISION OF RIGHT UPPER EXTREMITY ARTERIOVENOUS FISTULA;   Surgeon: Chuck Hint, MD;  Location: [facility] OR;  Service: Vascular;  Laterality: Right;   REVISON OF ARTERIOVENOUS FISTULA Right 03/21/2022   Procedure: EXCISION OF ULCERATED SKIN OF RIGHT ARTERIOVENOUS FISTULA REVISION;  Surgeon: Cephus Shelling, MD;  Location: [facility] OR;  Service: Vascular;  Laterality: Right;   REVISON OF ARTERIOVENOUS FISTULA Right 07/02/2022   Procedure: EXCISION OF ULCERS  RIGHT ARTERIOVENOUS FISTULA;  Surgeon: Chuck Hint, MD;  Location: [facility] OR;  Service: Vascular;  Laterality: Right;   SCLEROTHERAPY  05/29/2021   Procedure: Susa Day;  Surgeon: Rachael Fee, MD;  Location: [facility] ENDOSCOPY;  Service: Gastroenterology;;   Dennison Mascot  04/04/2022   Procedure: Dennison Mascot;  Surgeon: Meryl Dare, MD;  Location: [facility] ENDOSCOPY;  Service: Gastroenterology;;   Francine Graven REMOVAL  05/29/2021   Procedure: STENT REMOVAL;  Surgeon: Rachael Fee, MD;  Location: [facility] ENDOSCOPY;  Service: Gastroenterology;;   Francine Graven REMOVAL  05/17/2022   Procedure: STENT REMOVAL;  Surgeon: Lemar Lofty., MD;  Location: [facility] ENDOSCOPY;  Service: Gastroenterology;;   TEE WITHOUT CARDIOVERSION N/A 12/27/2015   Procedure: TRANSESOPHAGEAL ECHOCARDIOGRAM (TEE);  Surgeon: Quintella Reichert, MD;  Location: [facility] ENDOSCOPY;  Service: Cardiovascular;  Laterality: N/A;   UPPER ESOPHAGEAL ENDOSCOPIC ULTRASOUND (EUS) N/A 04/02/2022   Procedure: UPPER ESOPHAGEAL ENDOSCOPIC ULTRASOUND (EUS);  Surgeon: Lemar Lofty., MD;  Location: [facility] ENDOSCOPY;  Service: Gastroenterology;  Laterality: N/A;   Social History   Occupational History   Occupation: disabled  Tobacco Use   Smoking status: Former    Current packs/day: 0.00    Average packs/day: 0.5 packs/day for 41.0 years (20.5 ttl pk-yrs)    Types: Cigarettes    Start date: 12/08/1974    Quit date: 12/08/2015    Years since quitting: 7.3   Smokeless tobacco: Never  Vaping Use   Vaping status: Never Used  Substance and Sexual  Activity   Alcohol use: Not Currently    Alcohol/week: 3.0 standard drinks of alcohol    Types: 1 Glasses of wine, 2 Cans of beer per week    Comment: none since starting dialysis 06/2021   Drug use: No   Sexual activity: Not Currently

## 2023-04-30 DIAGNOSIS — N2581 Secondary hyperparathyroidism of renal origin: Secondary | ICD-10-CM | POA: Diagnosis not present

## 2023-04-30 DIAGNOSIS — Z992 Dependence on renal dialysis: Secondary | ICD-10-CM | POA: Diagnosis not present

## 2023-04-30 DIAGNOSIS — N186 End stage renal disease: Secondary | ICD-10-CM | POA: Diagnosis not present

## 2023-04-30 DIAGNOSIS — D57219 Sickle-cell/Hb-C disease with crisis, unspecified: Secondary | ICD-10-CM | POA: Diagnosis not present

## 2023-04-30 DIAGNOSIS — D571 Sickle-cell disease without crisis: Secondary | ICD-10-CM | POA: Diagnosis not present

## 2023-04-30 DIAGNOSIS — L299 Pruritus, unspecified: Secondary | ICD-10-CM | POA: Diagnosis not present

## 2023-04-30 DIAGNOSIS — D631 Anemia in chronic kidney disease: Secondary | ICD-10-CM | POA: Diagnosis not present

## 2023-04-30 DIAGNOSIS — D57819 Other sickle-cell disorders with crisis, unspecified: Secondary | ICD-10-CM | POA: Diagnosis not present

## 2023-04-30 DIAGNOSIS — D5700 Hb-SS disease with crisis, unspecified: Secondary | ICD-10-CM | POA: Diagnosis not present

## 2023-05-07 DIAGNOSIS — D631 Anemia in chronic kidney disease: Secondary | ICD-10-CM | POA: Diagnosis not present

## 2023-05-07 DIAGNOSIS — D509 Iron deficiency anemia, unspecified: Secondary | ICD-10-CM | POA: Diagnosis not present

## 2023-05-07 DIAGNOSIS — D57819 Other sickle-cell disorders with crisis, unspecified: Secondary | ICD-10-CM | POA: Diagnosis not present

## 2023-05-07 DIAGNOSIS — L299 Pruritus, unspecified: Secondary | ICD-10-CM | POA: Diagnosis not present

## 2023-05-07 DIAGNOSIS — N2581 Secondary hyperparathyroidism of renal origin: Secondary | ICD-10-CM | POA: Diagnosis not present

## 2023-05-07 DIAGNOSIS — N186 End stage renal disease: Secondary | ICD-10-CM | POA: Diagnosis not present

## 2023-05-07 DIAGNOSIS — Z992 Dependence on renal dialysis: Secondary | ICD-10-CM | POA: Diagnosis not present

## 2023-05-07 DIAGNOSIS — D571 Sickle-cell disease without crisis: Secondary | ICD-10-CM | POA: Diagnosis not present

## 2023-05-07 DIAGNOSIS — D5700 Hb-SS disease with crisis, unspecified: Secondary | ICD-10-CM | POA: Diagnosis not present

## 2023-05-07 DIAGNOSIS — D57219 Sickle-cell/Hb-C disease with crisis, unspecified: Secondary | ICD-10-CM | POA: Diagnosis not present

## 2023-05-08 DIAGNOSIS — H35053 Retinal neovascularization, unspecified, bilateral: Secondary | ICD-10-CM | POA: Diagnosis not present

## 2023-05-08 DIAGNOSIS — H3523 Other non-diabetic proliferative retinopathy, bilateral: Secondary | ICD-10-CM | POA: Diagnosis not present

## 2023-05-08 DIAGNOSIS — H3341 Traction detachment of retina, right eye: Secondary | ICD-10-CM | POA: Diagnosis not present

## 2023-05-08 DIAGNOSIS — D571 Sickle-cell disease without crisis: Secondary | ICD-10-CM | POA: Diagnosis not present

## 2023-05-12 ENCOUNTER — Other Ambulatory Visit (HOSPITAL_COMMUNITY): Payer: Self-pay | Admitting: Nurse Practitioner

## 2023-05-12 DIAGNOSIS — D571 Sickle-cell disease without crisis: Secondary | ICD-10-CM

## 2023-05-12 DIAGNOSIS — G894 Chronic pain syndrome: Secondary | ICD-10-CM

## 2023-05-12 DIAGNOSIS — M545 Low back pain, unspecified: Secondary | ICD-10-CM

## 2023-05-12 MED ORDER — HYDROXYUREA 500 MG PO CAPS: 500.0000 mg | ORAL_CAPSULE | Freq: Every day | ORAL | 1 refills | Status: DC

## 2023-05-12 MED ORDER — TIZANIDINE HCL 4 MG PO TABS: ORAL_TABLET | ORAL | 2 refills | Status: DC

## 2023-05-12 MED ORDER — OXYCODONE HCL 10 MG PO TABS
10.0000 mg | ORAL_TABLET | Freq: Four times a day (QID) | ORAL | 0 refills | Status: DC | PRN
Start: 2023-05-12 — End: 2023-06-25

## 2023-05-12 MED ORDER — OXYCODONE HCL ER 10 MG PO T12A
10.0000 mg | EXTENDED_RELEASE_TABLET | Freq: Two times a day (BID) | ORAL | 0 refills | Status: DC
Start: 2023-05-12 — End: 2023-07-23

## 2023-05-13 DIAGNOSIS — N2581 Secondary hyperparathyroidism of renal origin: Secondary | ICD-10-CM | POA: Diagnosis not present

## 2023-05-13 DIAGNOSIS — Z992 Dependence on renal dialysis: Secondary | ICD-10-CM | POA: Diagnosis not present

## 2023-05-13 DIAGNOSIS — D57219 Sickle-cell/Hb-C disease with crisis, unspecified: Secondary | ICD-10-CM | POA: Diagnosis not present

## 2023-05-13 DIAGNOSIS — D5700 Hb-SS disease with crisis, unspecified: Secondary | ICD-10-CM | POA: Diagnosis not present

## 2023-05-13 DIAGNOSIS — N186 End stage renal disease: Secondary | ICD-10-CM | POA: Diagnosis not present

## 2023-05-13 DIAGNOSIS — D631 Anemia in chronic kidney disease: Secondary | ICD-10-CM | POA: Diagnosis not present

## 2023-05-13 DIAGNOSIS — L299 Pruritus, unspecified: Secondary | ICD-10-CM | POA: Diagnosis not present

## 2023-05-13 DIAGNOSIS — D57819 Other sickle-cell disorders with crisis, unspecified: Secondary | ICD-10-CM | POA: Diagnosis not present

## 2023-05-13 DIAGNOSIS — D571 Sickle-cell disease without crisis: Secondary | ICD-10-CM | POA: Diagnosis not present

## 2023-05-18 DIAGNOSIS — Z992 Dependence on renal dialysis: Secondary | ICD-10-CM | POA: Diagnosis not present

## 2023-05-18 DIAGNOSIS — N186 End stage renal disease: Secondary | ICD-10-CM | POA: Diagnosis not present

## 2023-05-18 DIAGNOSIS — I158 Other secondary hypertension: Secondary | ICD-10-CM | POA: Diagnosis not present

## 2023-05-21 DIAGNOSIS — L299 Pruritus, unspecified: Secondary | ICD-10-CM | POA: Diagnosis not present

## 2023-05-21 DIAGNOSIS — D631 Anemia in chronic kidney disease: Secondary | ICD-10-CM | POA: Diagnosis not present

## 2023-05-21 DIAGNOSIS — D571 Sickle-cell disease without crisis: Secondary | ICD-10-CM | POA: Diagnosis not present

## 2023-05-21 DIAGNOSIS — N2581 Secondary hyperparathyroidism of renal origin: Secondary | ICD-10-CM | POA: Diagnosis not present

## 2023-05-21 DIAGNOSIS — D57219 Sickle-cell/Hb-C disease with crisis, unspecified: Secondary | ICD-10-CM | POA: Diagnosis not present

## 2023-05-21 DIAGNOSIS — Z992 Dependence on renal dialysis: Secondary | ICD-10-CM | POA: Diagnosis not present

## 2023-05-21 DIAGNOSIS — D5700 Hb-SS disease with crisis, unspecified: Secondary | ICD-10-CM | POA: Diagnosis not present

## 2023-05-21 DIAGNOSIS — D57819 Other sickle-cell disorders with crisis, unspecified: Secondary | ICD-10-CM | POA: Diagnosis not present

## 2023-05-21 DIAGNOSIS — N186 End stage renal disease: Secondary | ICD-10-CM | POA: Diagnosis not present

## 2023-05-28 DIAGNOSIS — D5700 Hb-SS disease with crisis, unspecified: Secondary | ICD-10-CM | POA: Diagnosis not present

## 2023-05-28 DIAGNOSIS — D571 Sickle-cell disease without crisis: Secondary | ICD-10-CM | POA: Diagnosis not present

## 2023-05-28 DIAGNOSIS — D57819 Other sickle-cell disorders with crisis, unspecified: Secondary | ICD-10-CM | POA: Diagnosis not present

## 2023-05-28 DIAGNOSIS — D57219 Sickle-cell/Hb-C disease with crisis, unspecified: Secondary | ICD-10-CM | POA: Diagnosis not present

## 2023-05-28 DIAGNOSIS — Z992 Dependence on renal dialysis: Secondary | ICD-10-CM | POA: Diagnosis not present

## 2023-05-28 DIAGNOSIS — N2581 Secondary hyperparathyroidism of renal origin: Secondary | ICD-10-CM | POA: Diagnosis not present

## 2023-05-28 DIAGNOSIS — L299 Pruritus, unspecified: Secondary | ICD-10-CM | POA: Diagnosis not present

## 2023-05-28 DIAGNOSIS — D631 Anemia in chronic kidney disease: Secondary | ICD-10-CM | POA: Diagnosis not present

## 2023-05-28 DIAGNOSIS — N186 End stage renal disease: Secondary | ICD-10-CM | POA: Diagnosis not present

## 2023-06-01 ENCOUNTER — Encounter (HOSPITAL_COMMUNITY): Payer: Self-pay

## 2023-06-01 ENCOUNTER — Other Ambulatory Visit: Payer: Self-pay

## 2023-06-01 ENCOUNTER — Inpatient Hospital Stay (HOSPITAL_COMMUNITY)
Admission: EM | Admit: 2023-06-01 | Discharge: 2023-06-04 | DRG: 314 | Disposition: A | Discharge status: Home / Self Care | Payer: PPO | Source: Ambulatory Visit | Attending: Internal Medicine | Admitting: Internal Medicine

## 2023-06-01 DIAGNOSIS — D631 Anemia in chronic kidney disease: Secondary | ICD-10-CM | POA: Diagnosis present

## 2023-06-01 DIAGNOSIS — D571 Sickle-cell disease without crisis: Secondary | ICD-10-CM | POA: Diagnosis present

## 2023-06-01 DIAGNOSIS — G8929 Other chronic pain: Secondary | ICD-10-CM | POA: Diagnosis present

## 2023-06-01 DIAGNOSIS — I5022 Chronic systolic (congestive) heart failure: Secondary | ICD-10-CM | POA: Diagnosis present

## 2023-06-01 DIAGNOSIS — I132 Hypertensive heart and chronic kidney disease with heart failure and with stage 5 chronic kidney disease, or end stage renal disease: Secondary | ICD-10-CM | POA: Diagnosis present

## 2023-06-01 DIAGNOSIS — Z992 Dependence on renal dialysis: Secondary | ICD-10-CM

## 2023-06-01 DIAGNOSIS — D649 Anemia, unspecified: Secondary | ICD-10-CM | POA: Diagnosis not present

## 2023-06-01 DIAGNOSIS — Z87891 Personal history of nicotine dependence: Secondary | ICD-10-CM

## 2023-06-01 DIAGNOSIS — Z8719 Personal history of other diseases of the digestive system: Secondary | ICD-10-CM

## 2023-06-01 DIAGNOSIS — R188 Other ascites: Secondary | ICD-10-CM | POA: Diagnosis present

## 2023-06-01 DIAGNOSIS — K7460 Unspecified cirrhosis of liver: Secondary | ICD-10-CM | POA: Diagnosis present

## 2023-06-01 DIAGNOSIS — I959 Hypotension, unspecified: Secondary | ICD-10-CM | POA: Diagnosis present

## 2023-06-01 DIAGNOSIS — I9589 Other hypotension: Secondary | ICD-10-CM | POA: Diagnosis not present

## 2023-06-01 DIAGNOSIS — I272 Pulmonary hypertension, unspecified: Secondary | ICD-10-CM | POA: Diagnosis present

## 2023-06-01 DIAGNOSIS — K219 Gastro-esophageal reflux disease without esophagitis: Secondary | ICD-10-CM | POA: Diagnosis present

## 2023-06-01 DIAGNOSIS — Z79899 Other long term (current) drug therapy: Secondary | ICD-10-CM

## 2023-06-01 DIAGNOSIS — H548 Legal blindness, as defined in USA: Secondary | ICD-10-CM | POA: Diagnosis present

## 2023-06-01 DIAGNOSIS — M1A9XX0 Chronic gout, unspecified, without tophus (tophi): Secondary | ICD-10-CM | POA: Diagnosis present

## 2023-06-01 DIAGNOSIS — M898X9 Other specified disorders of bone, unspecified site: Secondary | ICD-10-CM | POA: Diagnosis present

## 2023-06-01 DIAGNOSIS — Z833 Family history of diabetes mellitus: Secondary | ICD-10-CM

## 2023-06-01 DIAGNOSIS — Z79891 Long term (current) use of opiate analgesic: Secondary | ICD-10-CM

## 2023-06-01 DIAGNOSIS — N186 End stage renal disease: Secondary | ICD-10-CM | POA: Diagnosis present

## 2023-06-01 DIAGNOSIS — I4821 Permanent atrial fibrillation: Secondary | ICD-10-CM | POA: Diagnosis present

## 2023-06-01 DIAGNOSIS — I081 Rheumatic disorders of both mitral and tricuspid valves: Secondary | ICD-10-CM | POA: Diagnosis present

## 2023-06-01 DIAGNOSIS — Z8249 Family history of ischemic heart disease and other diseases of the circulatory system: Secondary | ICD-10-CM

## 2023-06-01 DIAGNOSIS — N2581 Secondary hyperparathyroidism of renal origin: Secondary | ICD-10-CM | POA: Diagnosis present

## 2023-06-01 LAB — BRAIN NATRIURETIC PEPTIDE: B Natriuretic Peptide: 2046.9 pg/mL — ABNORMAL HIGH (ref 0.0–100.0)

## 2023-06-01 LAB — HEPATIC FUNCTION PANEL
ALT: 20 U/L (ref 0–44)
AST: 47 U/L — ABNORMAL HIGH (ref 15–41)
Albumin: 3.2 g/dL — ABNORMAL LOW (ref 3.5–5.0)
Alkaline Phosphatase: 165 U/L — ABNORMAL HIGH (ref 38–126)
Bilirubin, Direct: 0.5 mg/dL — ABNORMAL HIGH (ref 0.0–0.2)
Indirect Bilirubin: 1.3 mg/dL — ABNORMAL HIGH (ref 0.3–0.9)
Total Bilirubin: 1.8 mg/dL — ABNORMAL HIGH (ref <1.2)
Total Protein: 7.2 g/dL (ref 6.5–8.1)

## 2023-06-01 LAB — CBC WITH DIFFERENTIAL/PLATELET
Abs Immature Granulocytes: 0.26 10*3/uL — ABNORMAL HIGH (ref 0.00–0.07)
Basophils Absolute: 0.1 10*3/uL (ref 0.0–0.1)
Basophils Relative: 1 %
Eosinophils Absolute: 0.3 10*3/uL (ref 0.0–0.5)
Eosinophils Relative: 4 %
HCT: 15.8 % — ABNORMAL LOW (ref 39.0–52.0)
Hemoglobin: 5.9 g/dL — CL (ref 13.0–17.0)
Immature Granulocytes: 3 %
Lymphocytes Relative: 10 %
Lymphs Abs: 0.8 10*3/uL (ref 0.7–4.0)
MCH: 41.5 pg — ABNORMAL HIGH (ref 26.0–34.0)
MCHC: 37.3 g/dL — ABNORMAL HIGH (ref 30.0–36.0)
MCV: 111.3 fL — ABNORMAL HIGH (ref 80.0–100.0)
Monocytes Absolute: 1.2 10*3/uL — ABNORMAL HIGH (ref 0.1–1.0)
Monocytes Relative: 15 %
Neutro Abs: 5.1 10*3/uL (ref 1.7–7.7)
Neutrophils Relative %: 67 %
Platelets: 114 10*3/uL — ABNORMAL LOW (ref 150–400)
RBC: 1.42 MIL/uL — ABNORMAL LOW (ref 4.22–5.81)
RDW: 18.6 % — ABNORMAL HIGH (ref 11.5–15.5)
Smear Review: DECREASED
WBC: 7.6 10*3/uL (ref 4.0–10.5)
nRBC: 11 % — ABNORMAL HIGH (ref 0.0–0.2)

## 2023-06-01 LAB — BASIC METABOLIC PANEL
Anion gap: 15 (ref 5–15)
BUN: 25 mg/dL — ABNORMAL HIGH (ref 8–23)
CO2: 26 mmol/L (ref 22–32)
Calcium: 8.5 mg/dL — ABNORMAL LOW (ref 8.9–10.3)
Chloride: 92 mmol/L — ABNORMAL LOW (ref 98–111)
Creatinine, Ser: 4.54 mg/dL — ABNORMAL HIGH (ref 0.61–1.24)
GFR, Estimated: 14 mL/min — ABNORMAL LOW (ref >60)
Glucose, Bld: 108 mg/dL — ABNORMAL HIGH (ref 70–99)
Potassium: 3.6 mmol/L (ref 3.5–5.1)
Sodium: 133 mmol/L — ABNORMAL LOW (ref 135–145)

## 2023-06-01 LAB — HEMOGLOBIN AND HEMATOCRIT, BLOOD
HCT: 16 % — ABNORMAL LOW (ref 39.0–52.0)
Hemoglobin: 5.9 g/dL — CL (ref 13.0–17.0)

## 2023-06-01 LAB — PREPARE RBC (CROSSMATCH)

## 2023-06-01 MED ORDER — FERRIC CITRATE 1 GM 210 MG(FE) PO TABS: 210.0000 mg | ORAL_TABLET | ORAL | Status: DC | PRN

## 2023-06-01 MED ORDER — SODIUM CHLORIDE 0.9% IV SOLUTION: Freq: Once | INTRAVENOUS | Status: DC

## 2023-06-01 MED ORDER — TORSEMIDE 20 MG PO TABS
20.0000 mg | ORAL_TABLET | Freq: Two times a day (BID) | ORAL | Status: DC
  Filled 2023-06-01: qty 1

## 2023-06-01 MED ORDER — OXYCODONE HCL ER 10 MG PO T12A
10.0000 mg | EXTENDED_RELEASE_TABLET | Freq: Two times a day (BID) | ORAL | Status: DC
  Administered 2023-06-01 – 2023-06-04 (×6): 10 mg via ORAL
  Filled 2023-06-01 (×6): qty 1

## 2023-06-01 MED ORDER — FERRIC CITRATE 1 GM 210 MG(FE) PO TABS
420.0000 mg | ORAL_TABLET | Freq: Three times a day (TID) | ORAL | Status: DC
  Administered 2023-06-02 – 2023-06-04 (×8): 420 mg via ORAL
  Filled 2023-06-01 (×8): qty 2

## 2023-06-01 MED ORDER — FOLIC ACID 1 MG PO TABS
1.0000 mg | ORAL_TABLET | Freq: Every day | ORAL | Status: DC
  Administered 2023-06-01 – 2023-06-04 (×4): 1 mg via ORAL
  Filled 2023-06-01 (×4): qty 1

## 2023-06-01 MED ORDER — OXYCODONE HCL 5 MG PO TABS
10.0000 mg | ORAL_TABLET | Freq: Four times a day (QID) | ORAL | Status: DC | PRN
  Administered 2023-06-02 – 2023-06-04 (×3): 10 mg via ORAL
  Filled 2023-06-01 (×3): qty 2

## 2023-06-01 MED ORDER — MIDODRINE HCL 5 MG PO TABS
5.0000 mg | ORAL_TABLET | Freq: Two times a day (BID) | ORAL | Status: DC
Start: 2023-06-01 — End: 2023-06-03
  Administered 2023-06-01 – 2023-06-03 (×4): 5 mg via ORAL
  Filled 2023-06-01 (×4): qty 1

## 2023-06-01 MED ORDER — MIDODRINE HCL 5 MG PO TABS: 5.0000 mg | ORAL_TABLET | Freq: Three times a day (TID) | ORAL | Status: DC | PRN

## 2023-06-01 MED ORDER — ACETAMINOPHEN 325 MG PO TABS
650.0000 mg | ORAL_TABLET | Freq: Four times a day (QID) | ORAL | Status: DC | PRN
  Filled 2023-06-01: qty 2

## 2023-06-01 MED ORDER — SODIUM CHLORIDE 0.9% IV SOLUTION: Freq: Once | INTRAVENOUS | Status: AC

## 2023-06-01 MED ORDER — HYDROXYUREA 500 MG PO CAPS
500.0000 mg | ORAL_CAPSULE | Freq: Every day | ORAL | Status: DC
  Administered 2023-06-02 – 2023-06-04 (×3): 500 mg via ORAL
  Filled 2023-06-01 (×3): qty 1

## 2023-06-01 MED ORDER — PANTOPRAZOLE SODIUM 40 MG IV SOLR
40.0000 mg | Freq: Two times a day (BID) | INTRAVENOUS | Status: DC
Start: 2023-06-01 — End: 2023-06-04
  Administered 2023-06-01 – 2023-06-04 (×6): 40 mg via INTRAVENOUS
  Filled 2023-06-01 (×6): qty 10

## 2023-06-01 MED ORDER — SODIUM CHLORIDE 0.9 % IV BOLUS
500.0000 mL | Freq: Once | INTRAVENOUS | Status: AC
  Administered 2023-06-01: 500 mL via INTRAVENOUS

## 2023-06-01 MED ORDER — ALLOPURINOL 100 MG PO TABS
100.0000 mg | ORAL_TABLET | Freq: Every day | ORAL | Status: DC
  Administered 2023-06-01 – 2023-06-03 (×3): 100 mg via ORAL
  Filled 2023-06-01 (×3): qty 1

## 2023-06-01 NOTE — Hospital Course (Addendum)
Symptomatic Hypotension  History of low BP. Blood pressures remain on the lower side today between 80s-90s systolic and 50s-60s diastolic. Per chart review, hypotension appears to be chronic. He was started on midodrine 5mg  which was up titrated to 10mg  TID. Patient was able to tolerate his HD session without hypotension or symptoms of hypotension. BP at discharge was 105/51.    Decompensated liver cirrhosis History of ascites  Patient does not present with signs or symptoms concerning for variceal bleed. Denied overt sx of bleeding however; this may be difficult to ascertain due to patient's vision impairment. Patient declined rectal exam. Ceftriaxone and IV PPI was discontinued.    Symptomatic Anemia  Sickle Cell Disease  Baseline Hgb 8-9, Hgb this morning is at 6.3. Patient's transfusion threshold has been Hgb <6 in the past. Follows with Dr. Hyman Hopes, MD and Julianne Handler, FNP regularly. Resumed home folic acid and pain regimen. He was continued on his home regimen of Folic acid 1 mg daily, Hydroxyurea 500 mg daily, Tylenol 650 mg q6H PRN, Scheduled, Oxycodone/Oxycontin 10 mg q12H, Oxycodone HCl 10 mg q6H PRN. Patient received 2 units of pRBCs during this hospitalization. He will require hematology follow up.   ESRD CKD stage 5 Patient goes to HD Tuesdays, Thursdays, and Saturdays. Last HD session cut short on day of admission 12/14. Consulted with nephrology for HD while patient is here. Patient received in patient HD on Tuesday and tolerated the session. He will start outpatient HD on Thursday.    HFrEF (LVEF 40-45%) 06/03/23 Patient has been seen by Dr. Teressa Lower in the past, but not for some time. GDMT limited by CKD, hypotension. Volume mostly managed by HD, takes torsemide 20 mg BID at home. Home torsemide was held during hospitalization and patient was instructed to hold torsemide until he follows up with his PCP. Echocardiogram showed an LVEF improved from 35-40% 05/2022 to 40-45%.     Permanent Afib not on anticoagulation  Patient with DCCV with successful conversion to NSR April 2021, back in atrial flutter May 2021. Referred to Afib clinic, per previous cardiology consult note not a good candidate for amiodarone, was on Eliquis at some point but this was discontinued due to upper GI bleed January 2023. Not on AV nodal blocking medications or anticoagulation at home. Patient remained rate controlled while hospitalized.    Gout Chronic. Begin renal dosing while on HD, 100 mg after HD sessions.

## 2023-06-01 NOTE — ED Notes (Signed)
I called 58m  the nurse is ready for the pt

## 2023-06-01 NOTE — ED Provider Notes (Signed)
[facility] AT [facility] Provider Note   CSN: [id number] Arrival date & time: 06/01/23  1004     History  Chief Complaint  Patient presents with   Hypotension   Shortness of Breath    Patrick [name] is a 67 y.o. male.  HPI   67 year old male presents for hypotension. Was at dialysis receiving his session when he became hypotensive, systolics in the 70s. He felt generally weak with tingling of all 4 extremities. After small fuid bolus with EMS he feels improved. Denies any other acute changes in health, recent illness. Says this has happened before with dialysis. No CP, syncope, headache.  Home Medications Prior to Admission medications   Medication Sig Start Date End Date Taking? Authorizing Provider  acetaminophen (TYLENOL) 500 MG tablet Take 1,500 mg by mouth 3 (three) times daily as needed for mild pain, moderate pain, fever or headache.    [provider]  allopurinol (ZYLOPRIM) 100 MG tablet Take 1 tablet (100 mg total) by mouth daily. 11/07/22   Ivonne Andrew, NP  AURYXIA 1 GM 210 MG(Fe) tablet Take 210-420 mg by mouth See admin instructions. 420 mg with every meal, 210 mg with each snack. 02/11/21   [provider]  camphor-menthol Wynelle Fanny) lotion Apply 1 Application topically as needed for itching. 04/23/23   Massie Maroon, FNP  cinacalcet (SENSIPAR) 30 MG tablet Take 1 tablet (30 mg total) by mouth Every Tuesday,Thursday,and Saturday with dialysis. 06/12/22   Shahmehdi, Gemma Payor, MD  diphenhydramine-acetaminophen (TYLENOL PM) 25-500 MG TABS tablet Take 2 tablets by mouth at bedtime as needed (sleep/pain). 09/26/22   Ivonne Andrew, NP  folic acid (FOLVITE) 1 MG tablet TAKE 1 TABLET BY MOUTH EVERY DAY 04/08/23   Massie Maroon, FNP  hydroxyurea (HYDREA) 500 MG capsule Take 1 capsule (500 mg total) by mouth daily. 05/12/23   Ivonne Andrew, NP  Iron-Vitamins (GERITOL PO) Take 15 mLs by mouth daily.    [provider]  lactulose (CHRONULAC) 10 GM/15ML solution Take 30 mLs (20 g total) by mouth 2 (two) times daily as needed for mild constipation. 10/23/22   Danford, Earl Lites, MD  lidocaine (LMX) 4 % cream Apply topically daily. 11/15/22   [provider]  lidocaine-prilocaine (EMLA) cream Apply 1 application. topically Every Tuesday,Thursday,and Saturday with dialysis. 09/08/19   [provider]  oxyCODONE (OXYCONTIN) 10 mg 12 hr tablet Take 1 tablet (10 mg total) by mouth every 12 (twelve) hours. 05/12/23   Ivonne Andrew, NP  Oxycodone HCl 10 MG TABS Take 1 tablet (10 mg total) by mouth every 6 (six) hours as needed. 05/12/23   Ivonne Andrew, NP  pantoprazole (PROTONIX) 40 MG tablet Take 1 tablet (40 mg total) by mouth 2 (two) times daily. X 2 months, then decrease to 1 tablet by mouth once daily therafter 11/23/22   Esterwood, Amy S, PA-C  pantoprazole (PROTONIX) 40 MG tablet Take 1 tablet (40 mg total) by mouth daily. Use this prescription after done with twice daily dosing x 2 months 11/23/22   Esterwood, Amy S, PA-C  tiZANidine (ZANAFLEX) 4 MG tablet TAKE 1/2 TABLET (2MG  TOTAL) BY MOUTH EVERY 8 HOURS AS NEEDED FOR MUSCLE SPASM 05/12/23   Ivonne Andrew, NP  torsemide (DEMADEX) 20 MG tablet TAKE 2 TABLETS BY MOUTH IN THE MORNING AND AT BEDTIME 01/09/23   Massie Maroon, FNP      Allergies    Patient  has no known allergies.    Review of Systems   Review of Systems  Constitutional:  Positive for fatigue. Negative for fever.  Respiratory:  Negative for shortness of breath.   Cardiovascular:  Negative for chest pain.  Gastrointestinal:  Negative for abdominal pain, diarrhea and vomiting.  Musculoskeletal:  Negative for back pain.  Skin:  Negative for rash.  Neurological:  Negative for headaches.       Tingling of 4/4 extremities    Physical Exam Updated Vital Signs BP (!) 90/55   Pulse 73   Temp 97.6 F (36.4 C) (Axillary)   Resp 10   Ht 5\' 11"  (1.803 m)    Wt 65.3 kg   SpO2 100%   BMI 20.08 kg/m  Physical Exam Vitals and nursing note reviewed.  Constitutional:      General: He is not in acute distress.    Appearance: Normal appearance.  HENT:     Head: Normocephalic.     Mouth/Throat:     Mouth: Mucous membranes are moist.  Cardiovascular:     Rate and Rhythm: Normal rate.  Pulmonary:     Effort: Pulmonary effort is normal. No tachypnea or respiratory distress.  Abdominal:     Palpations: Abdomen is soft.     Tenderness: There is no abdominal tenderness.  Musculoskeletal:     Right lower leg: No edema.     Left lower leg: No edema.  Skin:    General: Skin is warm.  Neurological:     Mental Status: He is alert and oriented to person, place, and time. Mental status is at baseline.  Psychiatric:        Mood and Affect: Mood normal.     ED Results / Procedures / Treatments   Labs (all labs ordered are listed, but only abnormal results are displayed) Labs Reviewed  CBC WITH DIFFERENTIAL/PLATELET  BASIC METABOLIC PANEL    EKG None  Radiology No results found.  Procedures .Critical Care  Performed by: Rozelle Logan, DO Authorized by: Rozelle Logan, DO   Critical care provider statement:    Critical care time (minutes):  30   Critical care time was exclusive of:  Separately billable procedures and treating other patients   Critical care was necessary to treat or prevent imminent or life-threatening deterioration of the following conditions:  Metabolic crisis   Critical care was time spent personally by me on the following activities:  Development of treatment plan with patient or surrogate, discussions with consultants, evaluation of patient's response to treatment, examination of patient, ordering and review of laboratory studies, ordering and review of radiographic studies, ordering and performing treatments and interventions, pulse oximetry, re-evaluation of patient's condition and review of old charts   I  assumed direction of critical care for this patient from another provider in my specialty: no     Care discussed with: admitting provider       Medications Ordered in ED Medications  sodium chloride 0.9 % bolus 500 mL (500 mLs Intravenous New Bag/Given 06/01/23 1128)    ED Course/ Medical Decision Making/ A&P                                 Medical Decision Making Amount and/or Complexity of Data Reviewed Labs: ordered.  Risk Prescription drug management. Decision regarding hospitalization.   67 year old here for hypotension during scheduled dialysis.  Appears at his baseline systolic is  usually around 90-100, on arrival he is low 80s, sometimes drops into the 70s.  He has been fluid responsive.  Otherwise denies any acute symptoms.  Blood work shows an acute anemia down to 5.9 with a baseline between 8-9.  He reveals a history of sickle cell and states that he was being evaluated for possible transfusion next week.  He denies any other signs of GI bleeding or black stool.  He declines a rectal.  Kidney function is expected in terms of his dialysis status.  He is agreeable to stay in the hospital for anemia, hypotension and transfusion.  Patients evaluation and results requires admission for further treatment and care.  Spoke with hospitalist, reviewed patient's ED course and they accept admission.  Patient agrees with admission plan, offers no new complaints and is stable/unchanged at time of admit.        Final Clinical Impression(s) / ED Diagnoses Final diagnoses:  None    Rx / DC Orders ED Discharge Orders     None         Rozelle Logan, DO 06/01/23 1544

## 2023-06-01 NOTE — ED Triage Notes (Signed)
Pt coming from dialysis with [facility] for hypotension and SOB. Pt received 1 hour of his 4.5 hour dialysis session when he was hypotensive in the 70s and started to c.o SOB.  Pt given fluid at dialysis but BP still around 81/48, EMS gave fluid PTA. Pt reports some improvement with his SOB but c.o bilateral leg numbness. Denies dizziness or feeling lightheaded.

## 2023-06-01 NOTE — ED Notes (Signed)
Iv team at bedside  

## 2023-06-01 NOTE — H&P (Cosign Needed Addendum)
Date: 06/01/2023               Patient Name:  Patrick [name] MRN: [id number]  DOB: [birth year] Age / Sex: 67 y.o., male   PCP: Quentin Angst, MD         Medical Service: Internal Medicine Teaching Service         Attending Physician: Dr. Dickie La, MD    First Contact: Filomena Jungling, MS3 Dr. Philomena Doheny, MD Pager:  Pager: [phone]  Second Contact: Dr. Rocky Morel, DO  Pager: [phone]       After Hours (After 5p/  First Contact Pager: [phone]  weekends / holidays): Second Contact Pager: [phone]   Chief Complaint: hypotension, SOB   History of Present Illness:   Patrick [name] is a 67 yo patient with a history of sickle cell disease, chronic pain, CKD 5 on HD Tu/Th/Sat, legally blind, history of GI bleed with angiodysplasia on EGD (07/2021), systolic CHF (LVEF 35-40%, 05/2022), permanent Afib not on anticoagulation 2/2 history of GI bleed, mitral valve mass (2017), decompensated liver cirrhosis with ascites, GERD, and gout who presented with hypotension and shortness of breath from his hemodialysis session and was admitted for symptomatic anemia.   Patient states he was in HD this morning when he started experiencing tingling numbness in his hands and feet as well as feeling dizzy, uneasy, and shortness of breath. Also endorsed diffuse chest pain. States he has experienced similar symptoms in the past, which can last a variable amount of time during and after HD. States his symptoms are improved including shortness of breath, denies any current chest pain, and just feels sleepy/tired. Patient typically doesn't take any medications prior to HD sessions, waits until after sessions. States he last took his medications on Thursday, as he forgot to take all medications yesterday (typically takes them to work, forgot to bring them).  Denies any current bleeding, including blood in stool or urine. Denies any recent falls. Endorses BM this morning  that was normal. Has his hemoglobin checked at HD sessions. Planned on getting a blood transfusion later this week at the [facility] Patient has a history of multiple blood transfusions. Per chart, last transfusion June 2024.   Patient also endorses history of GI bleed. Last colonoscopy April 2019 showing small internal hemorrhoids, otherwise normal with 10 year follow-up recommended. More recently scoped December 2022 (grade C esophagitis, oozing AVM lesser curve of stomach s/p APC clips, epinephrine) and January 2023 (grade B esophagitis, candida esophagitis, Schatzki's ring, diffuse moderate inflammation, oozing angioectasias in the gastric cardia treated with APC, high-dose PPI, and sucralfate).   Patient with history of sickle cell disease and sickle cell crisis, follows regularly with Dr. Hyman Hopes. Also has a history of chronic pain which is managed by PO pain medications. Per patient, he still has his spleen.   Code Status: Confirmed Full Code with patient, patient indicated Dawson Bills (cousin) as his surrogate Management consultant.  ED course:  - Given 300 mL fluid by EMS, 800 mL fluid at HD center  - Afebrile, BP 80/54, 100% RA, HR 78, RR 18 on admission - Given IV 500 ccs NS bolus  - Type and screen, 1 units of PRBCs ordered  Meds:  Patient confirmed he takes the following medications:  - Allopurinol 100 mg daily  - Auryxia 210 mg with each snack, 420 mg with every meal - Cinacalcet/sensipar 30 mg with HD (T, Th, Sat) -  Folic acid 1 mg daily  - Hydroxyurea 500 mg daily  - Geritol PO 15 mLs by mouth daily  - Oxycodone 10 mg q12H as needed - Pantoprazole 40 mg daily  - Tizanidine 2 mg as needed for back spasms - Torsemide 20 mg BID  - Doesn't think he's ever taken midodrine, doesn't think he is taking lactulose No outpatient medications have been marked as taking for the 06/01/23 encounter [facility] Encounter).   Allergies: Patient stated he has  NKDA. Allergies as of 06/01/2023   (No Known Allergies)   Past Medical History:  Diagnosis Date   A-fib (HCC)    Blood dyscrasia    Blood transfusion    "I've had a bunch of them"   CHF (congestive heart failure) (HCC)    followed by hf clinic   Chronic kidney disease    Stage 5, dialysis on Tu/Th/Sa   Dialysis patient (HCC) 04/2019   AV fistula (right arm), TTS   Dysrhythmia    A-Fib per patient   Early satiety    Elevated LFTs 04/01/2022   Elevated troponin 04/01/2022   Empyema of gallbladder 05/27/2021   GERD (gastroesophageal reflux disease)    Gout    Legally blind    Metabolic acidosis with increased anion gap and accumulation of organic acids 04/01/2022   Mitral regurgitation    Muscle tightness-Right arm  05/27/2014   Pneumonia    Pulmonary hypertension (HCC)    Shortness of breath dyspnea    Sickle cell disease, type SS (HCC)    Swelling abdomen    Swelling of extremity, left    Swelling of extremity, right    Tricuspid regurgitation    Surgical History:  - Gallbladder removed  Family History:  - Cancer in aunt and uncle, unknown which type - Aunt with diabetes  Social History:  - Employed, [employer]  - No tobacco use, alcohol use, illicit drugs or marijuana - No OTC medications  - PCP Dr. Hyman Hopes  Review of Systems: A complete ROS was negative except as per HPI.   Physical Exam: Blood pressure (!) 88/56, pulse 93, temperature 97.8 F (36.6 C), temperature source Oral, resp. rate 14, height 5\' 11"  (1.803 m), weight 65.3 kg, SpO2 100%. General: NAD, pleasant, cooperative  Cardiovascular: Irregularly irregular rhythm with normal rate, no r/m/g Respiratory: normal work of breathing, clear to auscultation bilaterally  Abdominal: soft, non-distended, no TTP  Neuro: alert and oriented, no focal deficits noted MSK: moving all limbs spontaneously  Psych: normal mood and affect     Latest Ref Rng & Units 06/01/2023   11:26 AM 12/04/2022    11:16 AM 11/27/2022    7:45 AM  CBC  WBC 4.0 - 10.5 K/uL 7.6  3.6  4.6   Hemoglobin 13.0 - 17.0 g/dL 5.9  8.9  8.3   Hematocrit 39.0 - 52.0 % 15.8  25.1  23.5   Platelets 150 - 400 K/uL 114  128  122        Latest Ref Rng & Units 06/01/2023   11:26 AM 11/27/2022    7:45 AM 11/26/2022    3:50 AM  BMP  Glucose 70 - 99 mg/dL 540  84  981   BUN 8 - 23 mg/dL 25  68  47   Creatinine 0.61 - 1.24 mg/dL 1.91  4.78  2.95   Sodium 135 - 145 mmol/L 133  126  127   Potassium 3.5 - 5.1 mmol/L 3.6  4.5  4.4  Chloride 98 - 111 mmol/L 92  90  88   CO2 22 - 32 mmol/L 26  24  23    Calcium 8.9 - 10.3 mg/dL 8.5  8.6  8.3     EKG: personally reviewed my interpretation is Afib 87 BPM, QT/QTc 474/51 ms.  Assessment & Plan by Problem: Active Problems:   Symptomatic anemia  Lynx D. Bolger is a 67 yo patient with a history of sickle cell disease, chronic pain, CKD 5 on HD, legally blind, history of GI bleed, systolic CHF, permanent Afib not on anticoagulation, history of mitral valve mass, decompensated liver cirrhosis, GERD, and gout, who presented with hypotension and shortness of breath from his hemodialysis session and was admitted for symptomatic anemia.   Symptomatic anemia  Hypotension  Patient's Hgb 5.9% on admission, 1 unit of PRBCs ordered. Baseline seems to be Hgb 8-9 with a history of transfusions Hgb 6.Symptoms seem improved since HD this morning, as patient is no longer having CP or as much SOB or tingling/weakness. Patient's O2 saturations 100% on room air. Lungs clear on exam. Does have a history of needing blood transfusions, per patient was supposed to have a blood transfusion later this week.    Patient with history of GI bleed and angiodysplasia on endoscopy last year. Currently denying any hematuria, hematochezia, or falls/trauma. No overt bleeding on exam, although patient denied DRE x 2 (once with ED physician, once with IM team). BM this morning normal. Will hold off on GI consult  for now, low threshold to consult GI if concerns for upper/lower GI bleed due to history.   Seems to have a history of low BP, as cardiology has held GDMT in the past due to low blood pressures. Blood pressures on admission have ranged 70s-90s systolic and 40s-60s diastolic. Will start midodrine 5 mg BID with meals.  Plan:  - Transfuse 1 unit PRBC - f/u post transfusion H & H  - Monitor Hgb, transfuse if Hgb < 7  - Daily CBC  - IV PPI  - Midodrine 5 mg BID with meals   Sickle Cell Disease  Follows with Dr. Hyman Hopes, MD and Julianne Handler, FNP regularly. Last office visit in November for pain management and routine labs. Will resume home pain regimen.  - Tylenol 650 mg q6H PRN  - Scheduled Oxycodone/Oxycontin 10 mg q12H  - Oxycodone HCl 10 mg q6H PRN   CKD 5 Patient goes to HD Tuesdays, Thursdays, and Saturdays. Last HD session today. Still able to make some of his own urine. Serum creatinine in the last 6 months ranges from 5.63 to 8.61. Today Cr 4.54. Will resume related medications. Can provide HD sessions here if needed.  - Resume home ferric citrate/Auryxia   Chronic systolic CHF (LVEF 35-40%) Patient has been seen by Dr. Teressa Lower in the past, but not for some time. GDMT limited by CKD, hypotension. Volume mostly managed by HD, takes torsemide 20 mg BID at home. Will hold torsemide 20 mg BID.   Permanent Afib not on anticoagulation  Patient with DCCV with successful conversion to NSR April 2021, back in atrial flutter May 2021. Referred to Afib clinic, per previous cardiology consult note not a good candidate for amiodarone, was on Eliquis at some point but this was discontinued due to upper GI bleed January 2023. Not on AV nodal blocking medications or anticoagulation at home. Rate controlled here. Will monitor.   Gout Chronic. Resumed home allopurinol 100 mg daily.   Diet: Clear liquids Code status:  Full Code VTE prophylaxis: SCDs  Dispo: Admit patient to Observation with  expected length of stay less than 2 midnights.  Signed: Philomena Doheny, MD 06/01/2023, 5:51 PM  After 5pm on weekdays and 1pm on weekends: On Call pager: [phone]

## 2023-06-01 NOTE — ED Notes (Signed)
ED TO INPATIENT HANDOFF REPORT  ED Nurse Name and Phone #: Thayer Ohm [phone]  S Name/Age/Gender Patrick [name] 67 y.o. male Room/Bed: 032C/032C  Code Status   Code Status: Full Code  Home/SNF/Other Home Patient oriented to: self, place, time, and situation Is this baseline? Yes   Triage Complete: Triage complete  Chief Complaint Symptomatic anemia [D64.9]  Triage Note Pt coming from dialysis with [facility] for hypotension and SOB. Pt received 1 hour of his 4.5 hour dialysis session when he was hypotensive in the 70s and started to c.o SOB.  Pt given fluid at dialysis but BP still around 81/48, EMS gave fluid PTA. Pt reports some improvement with his SOB but c.o bilateral leg numbness. Denies dizziness or feeling lightheaded.     Allergies No Known Allergies  Level of Care/Admitting Diagnosis ED Disposition     ED Disposition  Admit   Condition  --   Comment  Hospital Area: [facility] [[id number]]  Level of Care: Med-Surg [16]  May place patient in observation at [facility] or [facility] if equivalent level of care is available:: Yes  Covid Evaluation: Asymptomatic - no recent exposure (last 10 days) testing not required  Diagnosis: Symptomatic anemia [4696295]  Admitting Physician: Dickie La [[id number]]  Attending Physician: Dickie La [[id number]]          B Medical/Surgery History Past Medical History:  Diagnosis Date   A-fib (HCC)    Blood dyscrasia    Blood transfusion    "I've had a bunch of them"   CHF (congestive heart failure) (HCC)    followed by hf clinic   Chronic kidney disease    Stage 5, dialysis on Tu/Th/Sa   Dialysis patient (HCC) 04/2019   AV fistula (right arm), TTS   Dysrhythmia    A-Fib per patient   Early satiety    Elevated LFTs 04/01/2022   Elevated troponin 04/01/2022   Empyema of gallbladder 05/27/2021   GERD (gastroesophageal reflux disease)    Gout    Legally blind    Metabolic acidosis with  increased anion gap and accumulation of organic acids 04/01/2022   Mitral regurgitation    Muscle tightness-Right arm  05/27/2014   Pneumonia    Pulmonary hypertension (HCC)    Shortness of breath dyspnea    Sickle cell disease, type SS (HCC)    Swelling abdomen    Swelling of extremity, left    Swelling of extremity, right    Tricuspid regurgitation    Past Surgical History:  Procedure Laterality Date   A/V FISTULAGRAM Right 07/29/2020   Procedure: A/V FISTULAGRAM - Right Arm;  Surgeon: Chuck Hint, MD;  Location: [facility] INVASIVE CV LAB;  Service: Cardiovascular;  Laterality: Right;   BASCILIC VEIN TRANSPOSITION Right 04/12/2014   Procedure: BASILIC VEIN TRANSPOSITION;  Surgeon: Sherren Kerns, MD;  Location: [facility] OR;  Service: Vascular;  Laterality: Right;   BILIARY STENT PLACEMENT  04/04/2022   Procedure: BILIARY STENT PLACEMENT;  Surgeon: Meryl Dare, MD;  Location: [facility] ENDOSCOPY;  Service: Gastroenterology;;   BIOPSY  08/04/2020   Procedure: BIOPSY;  Surgeon: Meryl Dare, MD;  Location: [facility] ENDOSCOPY;  Service: Gastroenterology;;   BIOPSY  06/28/2021   Procedure: BIOPSY;  Surgeon: Beverley Fiedler, MD;  Location: [facility] ENDOSCOPY;  Service: Gastroenterology;;   BIOPSY  04/02/2022   Procedure: BIOPSY;  Surgeon: Lemar Lofty., MD;  Location: [facility] ENDOSCOPY;  Service: Gastroenterology;;   BIOPSY  10/22/2022   Procedure:  BIOPSY;  Surgeon: Beverley Fiedler, MD;  Location: [facility] ENDOSCOPY;  Service: Gastroenterology;;   BOWEL RESECTION N/A 05/24/2021   Procedure: SMALL BOWEL RESECTION;  Surgeon: Emelia Loron, MD;  Location: [facility] OR;  Service: General;  Laterality: N/A;   CARDIOVERSION N/A 06/05/2018   Procedure: CARDIOVERSION;  Surgeon: Dolores Patty, MD;  Location: [facility] ENDOSCOPY;  Service: Cardiovascular;  Laterality: N/A;   CARDIOVERSION N/A 10/06/2019   Procedure: CARDIOVERSION;  Surgeon: Dolores Patty, MD;  Location: [facility] ENDOSCOPY;  Service: Cardiovascular;   Laterality: N/A;   CHOLECYSTECTOMY N/A 05/24/2021   Procedure: OPEN CHOLECYSTECTOMY;  Surgeon: Emelia Loron, MD;  Location: [facility] OR;  Service: General;  Laterality: N/A;   COLONOSCOPY WITH PROPOFOL N/A 09/24/2017   Procedure: COLONOSCOPY WITH PROPOFOL;  Surgeon: Lynann Bologna, MD;  Location: [facility] ENDOSCOPY;  Service: Endoscopy;  Laterality: N/A;   ENDOSCOPIC RETROGRADE CHOLANGIOPANCREATOGRAPHY (ERCP) WITH PROPOFOL N/A 05/17/2022   Procedure: ENDOSCOPIC RETROGRADE CHOLANGIOPANCREATOGRAPHY (ERCP) WITH PROPOFOL;  Surgeon: Meridee Score Netty Starring., MD;  Location: [facility] ENDOSCOPY;  Service: Gastroenterology;  Laterality: N/A;   ERCP N/A 04/04/2022   Procedure: ENDOSCOPIC RETROGRADE CHOLANGIOPANCREATOGRAPHY (ERCP);  Surgeon: Meryl Dare, MD;  Location: [facility] ENDOSCOPY;  Service: Gastroenterology;  Laterality: N/A;   ESOPHAGOGASTRODUODENOSCOPY N/A 09/19/2017   Procedure: ESOPHAGOGASTRODUODENOSCOPY (EGD);  Surgeon: Lynann Bologna, MD;  Location: [facility] ENDOSCOPY;  Service: Endoscopy;  Laterality: N/A;   ESOPHAGOGASTRODUODENOSCOPY N/A 05/29/2021   Procedure: ESOPHAGOGASTRODUODENOSCOPY (EGD);  Surgeon: Rachael Fee, MD;  Location: [facility] ENDOSCOPY;  Service: Gastroenterology;  Laterality: N/A;   ESOPHAGOGASTRODUODENOSCOPY (EGD) WITH PROPOFOL N/A 08/04/2020   Procedure: ESOPHAGOGASTRODUODENOSCOPY (EGD) WITH PROPOFOL;  Surgeon: Meryl Dare, MD;  Location: [facility] ENDOSCOPY;  Service: Gastroenterology;  Laterality: N/A;   ESOPHAGOGASTRODUODENOSCOPY (EGD) WITH PROPOFOL N/A 06/28/2021   Procedure: ESOPHAGOGASTRODUODENOSCOPY (EGD) WITH PROPOFOL;  Surgeon: Beverley Fiedler, MD;  Location: [facility] ENDOSCOPY;  Service: Gastroenterology;  Laterality: N/A;   ESOPHAGOGASTRODUODENOSCOPY (EGD) WITH PROPOFOL N/A 08/10/2021   Procedure: ESOPHAGOGASTRODUODENOSCOPY (EGD) WITH PROPOFOL;  Surgeon: Lynann Bologna, MD;  Location: [facility] ENDOSCOPY;  Service: Gastroenterology;  Laterality: N/A;   ESOPHAGOGASTRODUODENOSCOPY (EGD) WITH PROPOFOL N/A  08/02/2021   Procedure: ESOPHAGOGASTRODUODENOSCOPY (EGD) WITH PROPOFOL;  Surgeon: Shellia Cleverly, DO;  Location: [facility] ENDOSCOPY;  Service: Gastroenterology;  Laterality: N/A;   ESOPHAGOGASTRODUODENOSCOPY (EGD) WITH PROPOFOL N/A 04/02/2022   Procedure: ESOPHAGOGASTRODUODENOSCOPY (EGD) WITH PROPOFOL;  Surgeon: Meridee Score Netty Starring., MD;  Location: [facility] ENDOSCOPY;  Service: Gastroenterology;  Laterality: N/A;   ESOPHAGOGASTRODUODENOSCOPY (EGD) WITH PROPOFOL N/A 05/17/2022   Procedure: ESOPHAGOGASTRODUODENOSCOPY (EGD) WITH PROPOFOL;  Surgeon: Meridee Score Netty Starring., MD;  Location: [facility] ENDOSCOPY;  Service: Gastroenterology;  Laterality: N/A;   ESOPHAGOGASTRODUODENOSCOPY (EGD) WITH PROPOFOL N/A 10/22/2022   Procedure: ESOPHAGOGASTRODUODENOSCOPY (EGD) WITH PROPOFOL;  Surgeon: Beverley Fiedler, MD;  Location: [facility] ENDOSCOPY;  Service: Gastroenterology;  Laterality: N/A;   EUS N/A 05/17/2022   Procedure: UPPER ENDOSCOPIC ULTRASOUND (EUS) RADIAL;  Surgeon: Lemar Lofty., MD;  Location: [facility] ENDOSCOPY;  Service: Gastroenterology;  Laterality: N/A;   FINE NEEDLE ASPIRATION  04/02/2022   Procedure: FINE NEEDLE ASPIRATION (FNA) LINEAR;  Surgeon: Lemar Lofty., MD;  Location: [facility] ENDOSCOPY;  Service: Gastroenterology;;   FINE NEEDLE ASPIRATION N/A 05/17/2022   Procedure: FINE NEEDLE ASPIRATION (FNA) LINEAR;  Surgeon: Lemar Lofty., MD;  Location: [facility] ENDOSCOPY;  Service: Gastroenterology;  Laterality: N/A;   HEMOSTASIS CLIP PLACEMENT  05/29/2021   Procedure: HEMOSTASIS CLIP PLACEMENT;  Surgeon: Rachael Fee, MD;  Location: [facility] ENDOSCOPY;  Service: Gastroenterology;;   HOT HEMOSTASIS N/A 05/29/2021   Procedure: HOT  HEMOSTASIS (ARGON PLASMA COAGULATION/BICAP);  Surgeon: Rachael Fee, MD;  Location: [facility] ENDOSCOPY;  Service: Gastroenterology;  Laterality: N/A;   HOT HEMOSTASIS N/A 06/28/2021   Procedure: HOT HEMOSTASIS (ARGON PLASMA COAGULATION/BICAP);  Surgeon: Beverley Fiedler, MD;   Location: [facility] ENDOSCOPY;  Service: Gastroenterology;  Laterality: N/A;   HOT HEMOSTASIS N/A 08/10/2021   Procedure: HOT HEMOSTASIS (ARGON PLASMA COAGULATION/BICAP);  Surgeon: Lynann Bologna, MD;  Location: [facility] ENDOSCOPY;  Service: Gastroenterology;  Laterality: N/A;   HOT HEMOSTASIS N/A 08/02/2021   Procedure: HOT HEMOSTASIS (ARGON PLASMA COAGULATION/BICAP);  Surgeon: Shellia Cleverly, DO;  Location: [facility] ENDOSCOPY;  Service: Gastroenterology;  Laterality: N/A;   IR PARACENTESIS  07/04/2022   IR PERC TUN PERIT CATH WO PORT S&I /IMAG  05/23/2021   IR REMOVAL TUN CV CATH W/O FL  06/10/2021   IR US GUIDE VASC ACCESS RIGHT  05/23/2021   LAPAROSCOPY N/A 05/24/2021   Procedure: LAPAROSCOPY DIAGNOSTIC;  Surgeon: Emelia Loron, MD;  Location: [facility] OR;  Service: General;  Laterality: N/A;   LAPAROTOMY N/A 05/24/2021   Procedure: EXPLORATORY LAPAROTOMY;  Surgeon: Emelia Loron, MD;  Location: [facility] OR;  Service: General;  Laterality: N/A;   LEFT AND RIGHT HEART CATHETERIZATION WITH CORONARY ANGIOGRAM N/A 06/11/2011   Procedure: LEFT AND RIGHT HEART CATHETERIZATION WITH CORONARY ANGIOGRAM;  Surgeon: Laurey Morale, MD;  Location: [facility] CATH LAB;  Service: Cardiovascular;  Laterality: N/A;   PERIPHERAL VASCULAR BALLOON ANGIOPLASTY Right 07/29/2020   Procedure: PERIPHERAL VASCULAR BALLOON ANGIOPLASTY;  Surgeon: Chuck Hint, MD;  Location: [facility] INVASIVE CV LAB;  Service: Cardiovascular;  Laterality: Right;  arm fistula   REMOVAL OF STONES  05/17/2022   Procedure: REMOVAL OF STONES;  Surgeon: Meridee Score Netty Starring., MD;  Location: [facility] ENDOSCOPY;  Service: Gastroenterology;;   REVISON OF ARTERIOVENOUS FISTULA Right 08/08/2020   Procedure: ANEURYSM EXCISION OF RIGHT UPPER EXTREMITY ARTERIOVENOUS FISTULA;  Surgeon: Chuck Hint, MD;  Location: [facility] OR;  Service: Vascular;  Laterality: Right;   REVISON OF ARTERIOVENOUS FISTULA Right 03/21/2022   Procedure: EXCISION OF ULCERATED SKIN OF RIGHT  ARTERIOVENOUS FISTULA REVISION;  Surgeon: Cephus Shelling, MD;  Location: [facility] OR;  Service: Vascular;  Laterality: Right;   REVISON OF ARTERIOVENOUS FISTULA Right 07/02/2022   Procedure: EXCISION OF ULCERS RIGHT ARTERIOVENOUS FISTULA;  Surgeon: Chuck Hint, MD;  Location: [facility] OR;  Service: Vascular;  Laterality: Right;   SCLEROTHERAPY  05/29/2021   Procedure: Susa Day;  Surgeon: Rachael Fee, MD;  Location: [facility] ENDOSCOPY;  Service: Gastroenterology;;   Dennison Mascot  04/04/2022   Procedure: SPHINCTEROTOMY;  Surgeon: Meryl Dare, MD;  Location: [facility] ENDOSCOPY;  Service: Gastroenterology;;   Francine Graven REMOVAL  05/29/2021   Procedure: STENT REMOVAL;  Surgeon: Rachael Fee, MD;  Location: [facility] ENDOSCOPY;  Service: Gastroenterology;;   Francine Graven REMOVAL  05/17/2022   Procedure: STENT REMOVAL;  Surgeon: Lemar Lofty., MD;  Location: [facility] ENDOSCOPY;  Service: Gastroenterology;;   TEE WITHOUT CARDIOVERSION N/A 12/27/2015   Procedure: TRANSESOPHAGEAL ECHOCARDIOGRAM (TEE);  Surgeon: Quintella Reichert, MD;  Location: [facility] ENDOSCOPY;  Service: Cardiovascular;  Laterality: N/A;   UPPER ESOPHAGEAL ENDOSCOPIC ULTRASOUND (EUS) N/A 04/02/2022   Procedure: UPPER ESOPHAGEAL ENDOSCOPIC ULTRASOUND (EUS);  Surgeon: Lemar Lofty., MD;  Location: [facility] ENDOSCOPY;  Service: Gastroenterology;  Laterality: N/A;     A IV Location/Drains/Wounds Patient Lines/Drains/Airways Status     Active Line/Drains/Airways     Name Placement date Placement time Site Days   Peripheral IV 06/01/23 18 G Left Antecubital 06/01/23  1014  Antecubital  less than 1   Fistula / Graft Right Upper arm Arteriovenous fistula 08/01/21  0354  Upper arm  669            Intake/Output Last 24 hours  Intake/Output Summary (Last 24 hours) at 06/01/2023 1644 Last data filed at 06/01/2023 1304 Gross per 24 hour  Intake 500 ml  Output --  Net 500 ml    Labs/Imaging Results for orders placed or performed  during the hospital encounter of 06/01/23 (from the past 48 hours)  CBC with Differential     Status: Abnormal   Collection Time: 06/01/23 11:26 AM  Result Value Ref Range   WBC 7.6 4.0 - 10.5 K/uL   RBC 1.42 (L) 4.22 - 5.81 MIL/uL   Hemoglobin 5.9 (LL) 13.0 - 17.0 g/dL    Comment: REPEATED TO VERIFY THIS CRITICAL RESULT HAS VERIFIED AND BEEN CALLED TO T, [place] RN BY DAVID BLAIR ON 12 14 2024 AT 1227, AND HAS BEEN READ BACK.     HCT 15.8 (L) 39.0 - 52.0 %   MCV 111.3 (H) 80.0 - 100.0 fL   MCH 41.5 (H) 26.0 - 34.0 pg   MCHC 37.3 (H) 30.0 - 36.0 g/dL   RDW 40.9 (H) 81.1 - 91.4 %   Platelets 114 (L) 150 - 400 K/uL   nRBC 11.0 (H) 0.0 - 0.2 %   Neutrophils Relative % 67 %   Neutro Abs 5.1 1.7 - 7.7 K/uL   Lymphocytes Relative 10 %   Lymphs Abs 0.8 0.7 - 4.0 K/uL   Monocytes Relative 15 %   Monocytes Absolute 1.2 (H) 0.1 - 1.0 K/uL   Eosinophils Relative 4 %   Eosinophils Absolute 0.3 0.0 - 0.5 K/uL   Basophils Relative 1 %   Basophils Absolute 0.1 0.0 - 0.1 K/uL   WBC Morphology MORPHOLOGY UNREMARKABLE    RBC Morphology See Note    Smear Review PLATELETS APPEAR DECREASED    Immature Granulocytes 3 %   Abs Immature Granulocytes 0.26 (H) 0.00 - 0.07 K/uL   Polychromasia PRESENT    Target Cells PRESENT    Ovalocytes PRESENT     Comment: Performed at [facility], [address] [place], [place] [address]  Basic metabolic panel     Status: Abnormal   Collection Time: 06/01/23 11:26 AM  Result Value Ref Range   Sodium 133 (L) 135 - 145 mmol/L   Potassium 3.6 3.5 - 5.1 mmol/L   Chloride 92 (L) 98 - 111 mmol/L   CO2 26 22 - 32 mmol/L   Glucose, Bld 108 (H) 70 - 99 mg/dL    Comment: Glucose reference range applies only to samples taken after fasting for at least 8 hours.   BUN 25 (H) 8 - 23 mg/dL   Creatinine, Ser 6.21 (H) 0.61 - 1.24 mg/dL   Calcium 8.5 (L) 8.9 - 10.3 mg/dL   GFR, Estimated 14 (L) >60 mL/min    Comment: (NOTE) Calculated using the CKD-EPI Creatinine  Equation (2021)    Anion gap 15 5 - 15    Comment: Performed at [facility], [address] [place], [place] [address]  Type and screen [facility]     Status: None (Preliminary result)   Collection Time: 06/01/23 11:26 AM  Result Value Ref Range   ABO/RH(D) AB POS    Antibody Screen NEG    Sample Expiration 06/04/2023,2359    Unit Number [id number]    Blood  Component Type RED CELLS,LR    Unit division 00    Status of Unit ISSUED    Transfusion Status OK TO TRANSFUSE    Crossmatch Result      Compatible Performed at [facility], [address], [place], [place] [address]   Hepatic function panel     Status: Abnormal   Collection Time: 06/01/23 11:26 AM  Result Value Ref Range   Total Protein 7.2 6.5 - 8.1 g/dL   Albumin 3.2 (L) 3.5 - 5.0 g/dL   AST 47 (H) 15 - 41 U/L   ALT 20 0 - 44 U/L   Alkaline Phosphatase 165 (H) 38 - 126 U/L   Total Bilirubin 1.8 (H) <1.2 mg/dL   Bilirubin, Direct 0.5 (H) 0.0 - 0.2 mg/dL   Indirect Bilirubin 1.3 (H) 0.3 - 0.9 mg/dL    Comment: Performed at [facility], [address] [place], [place] [address]  Prepare RBC (crossmatch)     Status: None   Collection Time: 06/01/23  2:21 PM  Result Value Ref Range   Order Confirmation      ORDER PROCESSED BY BLOOD BANK Performed at [facility], [address] [place], [place] [address]    *Note: Due to a large number of results and/or encounters for the requested time period, some results have not been displayed. A complete set of results can be found in Results Review.   No results found.  Pending Labs Unresulted Labs (From admission, onward)    None       Vitals/Pain Today's Vitals   06/01/23 1400 06/01/23 1415 06/01/23 1625 06/01/23 1634  BP: (!) 88/54 (!) 84/55 96/62 96/62   Pulse:  72 89 89  Resp: 19 19 18 18   Temp:   98 F (36.7 C) 98 F (36.7 C)  TempSrc:   Oral Oral  SpO2:  100% 100%   Weight:      Height:      PainSc:         Isolation Precautions No active isolations  Medications Medications  0.9 %  sodium chloride infusion (Manually program via Guardrails IV Fluids) (has no administration in time range)  allopurinol (ZYLOPRIM) tablet 100 mg (has no administration in time range)  ferric citrate (AURYXIA) tablet 210-420 mg (has no administration in time range)  hydroxyurea (HYDREA) capsule 500 mg (has no administration in time range)  torsemide (DEMADEX) tablet 20 mg (has no administration in time range)  folic acid (FOLVITE) tablet 1 mg (has no administration in time range)  pantoprazole (PROTONIX) injection 40 mg (has no administration in time range)  oxyCODONE (OXYCONTIN) 12 hr tablet 10 mg (has no administration in time range)  oxyCODONE (Oxy IR/ROXICODONE) immediate release tablet 10 mg (has no administration in time range)  acetaminophen (TYLENOL) tablet 650 mg (has no administration in time range)  sodium chloride 0.9 % bolus 500 mL (0 mLs Intravenous Stopped 06/01/23 1304)    Mobility walks     Focused Assessments Cardiac Assessment Handoff:  Cardiac Rhythm: Atrial fibrillation Lab Results  Component Value Date   CKTOTAL 110 07/24/2022   CKMB 1.7 10/18/2011   TROPONINI <0.03 06/05/2018   Lab Results  Component Value Date   DDIMER >20.00 (H) 05/29/2021   Does the Patient currently have chest pain? No    R Recommendations: See Admitting Provider Note  Report given to:   Additional Notes:

## 2023-06-02 DIAGNOSIS — R188 Other ascites: Secondary | ICD-10-CM | POA: Diagnosis not present

## 2023-06-02 DIAGNOSIS — I959 Hypotension, unspecified: Secondary | ICD-10-CM

## 2023-06-02 DIAGNOSIS — D571 Sickle-cell disease without crisis: Secondary | ICD-10-CM | POA: Diagnosis not present

## 2023-06-02 DIAGNOSIS — D649 Anemia, unspecified: Secondary | ICD-10-CM | POA: Diagnosis not present

## 2023-06-02 DIAGNOSIS — N186 End stage renal disease: Secondary | ICD-10-CM | POA: Diagnosis not present

## 2023-06-02 DIAGNOSIS — K7460 Unspecified cirrhosis of liver: Secondary | ICD-10-CM

## 2023-06-02 DIAGNOSIS — Z992 Dependence on renal dialysis: Secondary | ICD-10-CM | POA: Diagnosis not present

## 2023-06-02 LAB — CBC
HCT: 19.3 % — ABNORMAL LOW (ref 39.0–52.0)
Hemoglobin: 7.2 g/dL — ABNORMAL LOW (ref 13.0–17.0)
Hemoglobin: 7.4 g/dL — ABNORMAL LOW (ref 13.0–17.0)
MCH: 38.1 pg — ABNORMAL HIGH (ref 26.0–34.0)
MCHC: 37.3 g/dL — ABNORMAL HIGH (ref 30.0–36.0)
MCV: 102.1 fL — ABNORMAL HIGH (ref 80.0–100.0)
Platelets: 110 10*3/uL — ABNORMAL LOW (ref 150–400)
Platelets: 112 10*3/uL — ABNORMAL LOW (ref 150–400)
RBC: 1.89 MIL/uL — ABNORMAL LOW (ref 4.22–5.81)
RDW: 22.8 % — ABNORMAL HIGH (ref 11.5–15.5)
WBC: 5.8 10*3/uL (ref 4.0–10.5)
WBC: 6.7 10*3/uL (ref 4.0–10.5)
nRBC: 11.1 % — ABNORMAL HIGH (ref 0.0–0.2)
nRBC: 8.7 % — ABNORMAL HIGH (ref 0.0–0.2)

## 2023-06-02 LAB — BASIC METABOLIC PANEL
Anion gap: 16 — ABNORMAL HIGH (ref 5–15)
BUN: 36 mg/dL — ABNORMAL HIGH (ref 8–23)
CO2: 22 mmol/L (ref 22–32)
Calcium: 8.9 mg/dL (ref 8.9–10.3)
Chloride: 95 mmol/L — ABNORMAL LOW (ref 98–111)
Creatinine, Ser: 6.2 mg/dL — ABNORMAL HIGH (ref 0.61–1.24)
GFR, Estimated: 9 mL/min — ABNORMAL LOW (ref >60)
Glucose, Bld: 86 mg/dL (ref 70–99)
Potassium: 4.3 mmol/L (ref 3.5–5.1)
Sodium: 133 mmol/L — ABNORMAL LOW (ref 135–145)

## 2023-06-02 LAB — RETICULOCYTES
Immature Retic Fract: 32.7 % — ABNORMAL HIGH (ref 2.3–15.9)
RBC.: 1.87 MIL/uL — ABNORMAL LOW (ref 4.22–5.81)
Retic Count, Absolute: 163.1 10*3/uL (ref 19.0–186.0)
Retic Ct Pct: 8.7 % — ABNORMAL HIGH (ref 0.4–3.1)

## 2023-06-02 LAB — MRSA NEXT GEN BY PCR, NASAL: MRSA by PCR Next Gen: NOT DETECTED

## 2023-06-02 LAB — PHOSPHORUS: Phosphorus: 8.7 mg/dL — ABNORMAL HIGH (ref 2.5–4.6)

## 2023-06-02 IMAGING — CT CT ABD-PELV W/ CM
2 of 5 series · 15 of 46 positions shown, 17 images · IV contrast (Omni 300)
Comparison: 07/06/2021

CLINICAL DATA: 65-year-old male status post abdominal abscess drain
placement on 08/06/2021. Follow-up study.

EXAM:
CT ABDOMEN AND PELVIS WITH CONTRAST
TECHNIQUE: Multidetector CT imaging of the abdomen and pelvis was performed
using the standard protocol following bolus administration of
intravenous contrast.

[Series 3: a/p w/ 5mm · axial · 0.75mm/px · z∈[+830,+1280]mm · 12 of 101 slices shown, 14 images]
[im 6/101  soft-tissue]
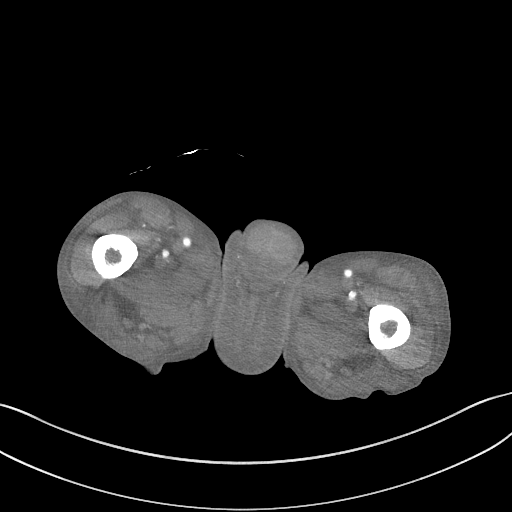
[im 6/101  bone]
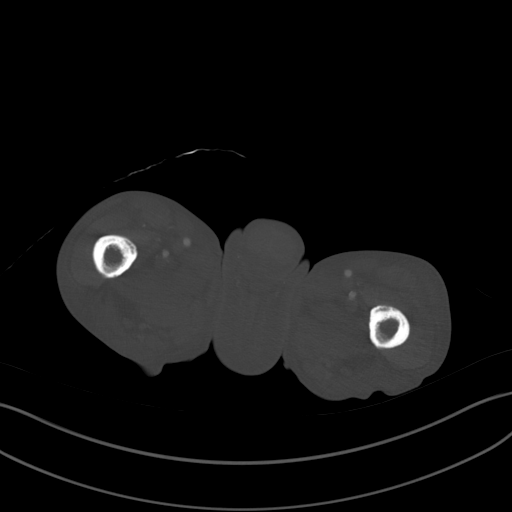
[im 16/101  soft-tissue]
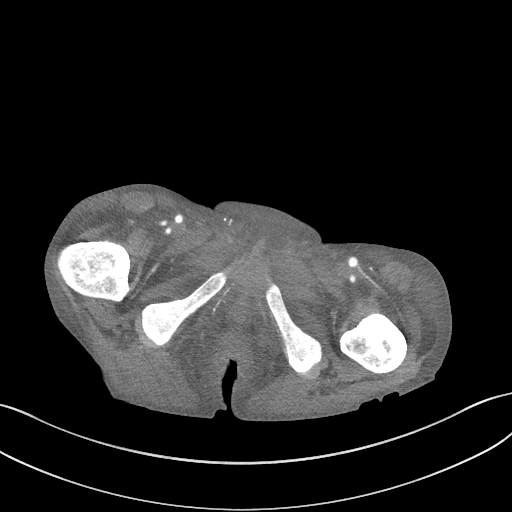
[im 21/101  soft-tissue]
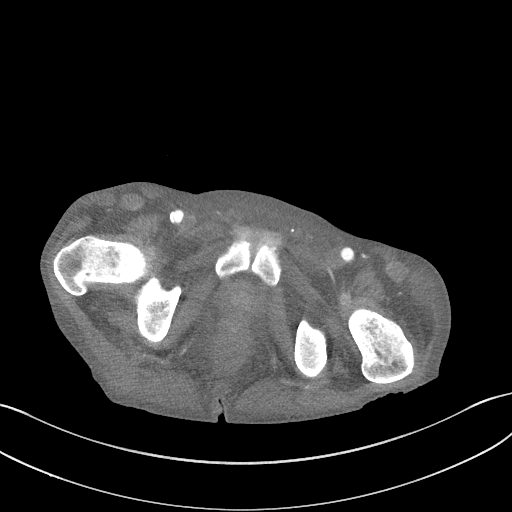
[im 31/101  soft-tissue]
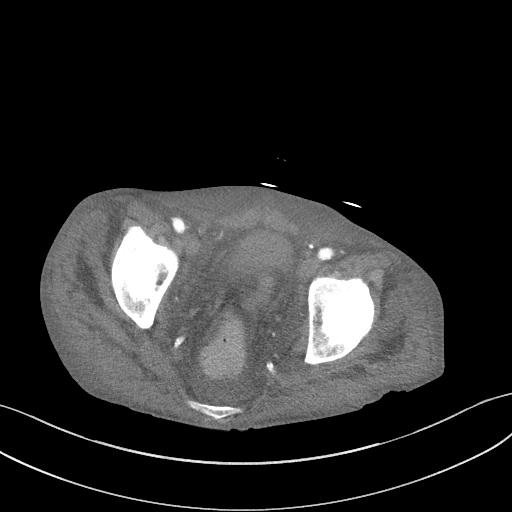
[im 41/101  soft-tissue]
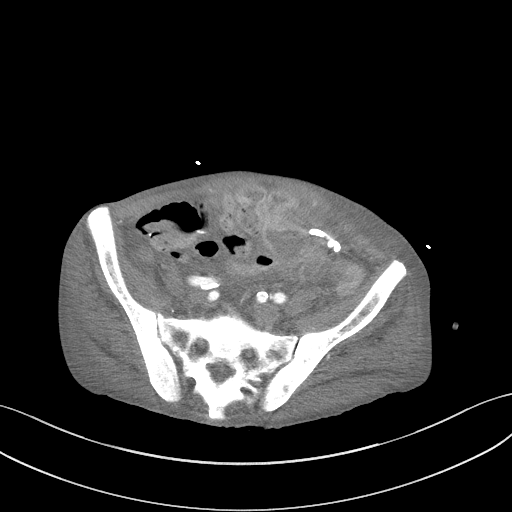
[im 46/101  soft-tissue]
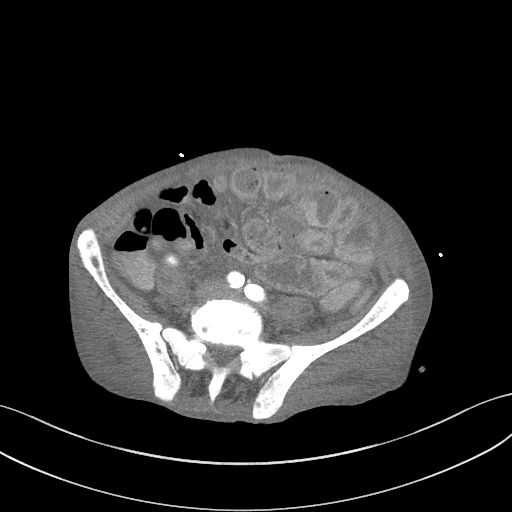
[im 56/101  soft-tissue]
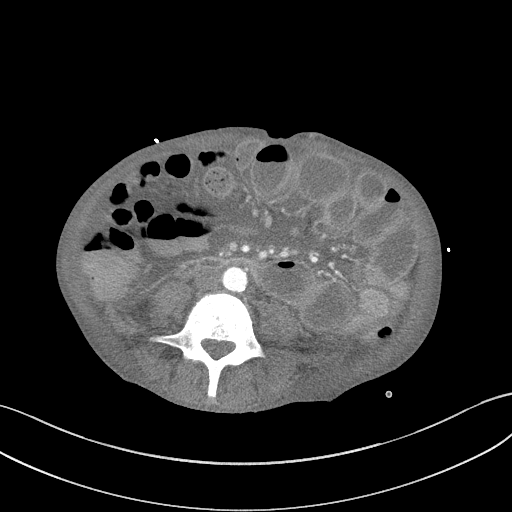
[im 61/101  soft-tissue]
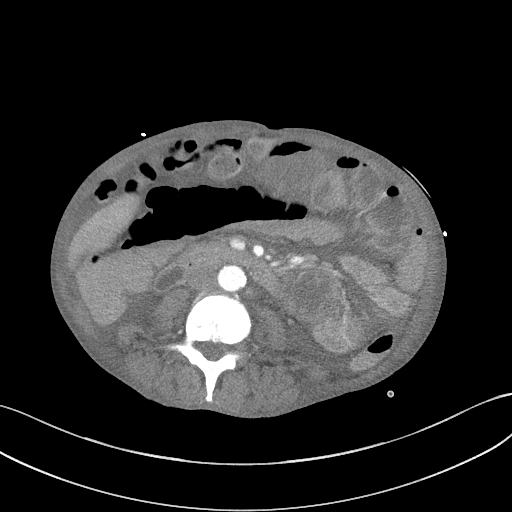
[im 71/101  soft-tissue]
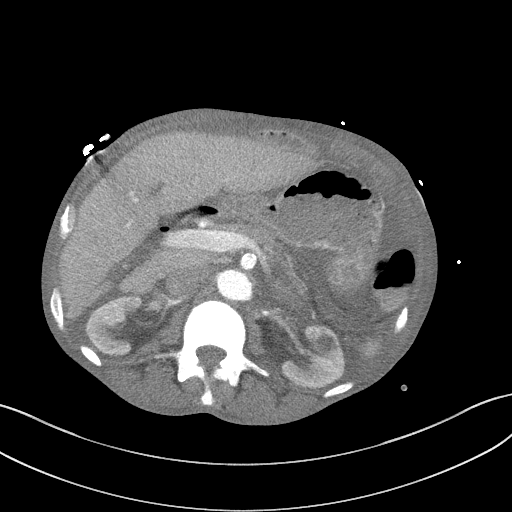
[im 71/101  bone]
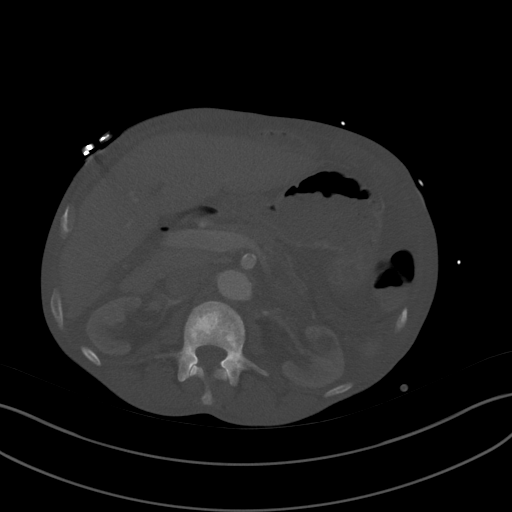
[im 81/101  soft-tissue]
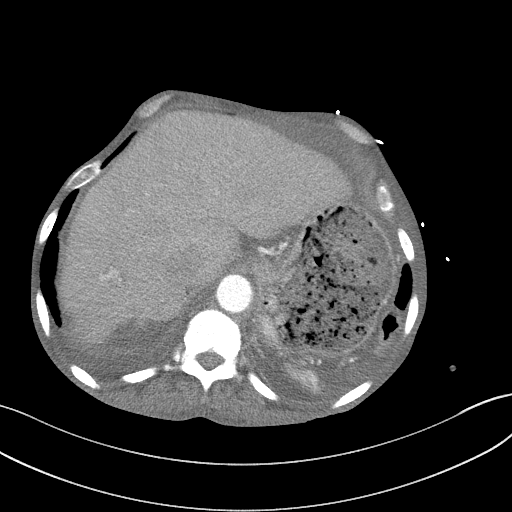
[im 86/101  soft-tissue]
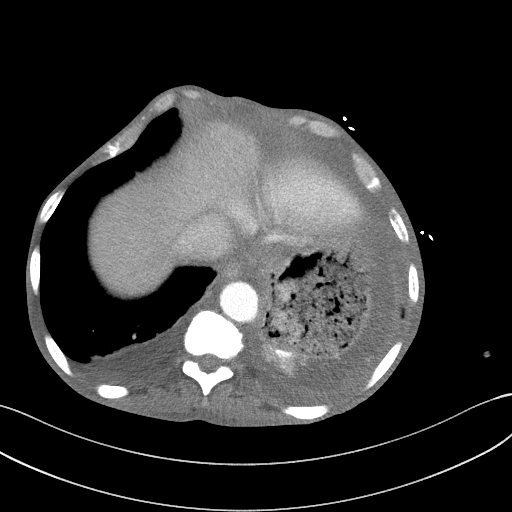
[im 96/101  soft-tissue]
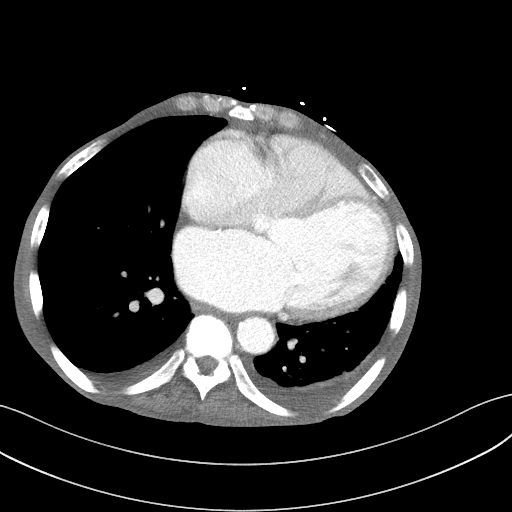

[Series 6: a/p w/ cor · coronal · 0.63mm/px · 3 of 129 slices shown]
[im 43/129  soft-tissue]
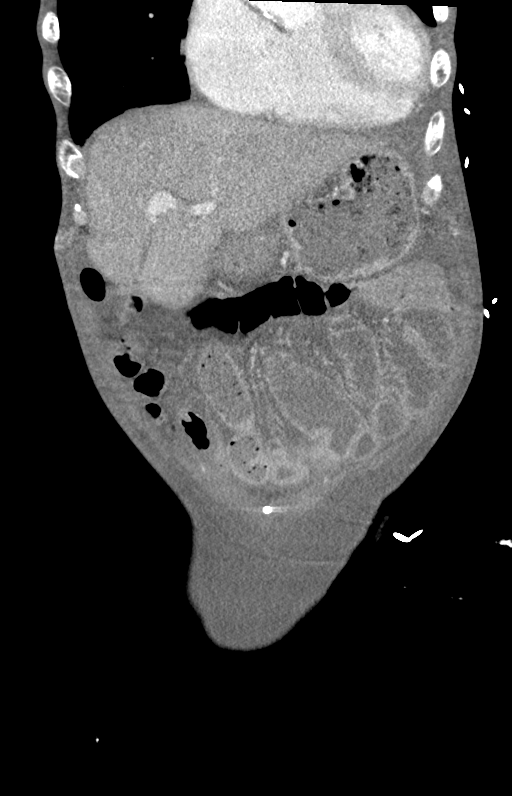
[im 57/129  soft-tissue]
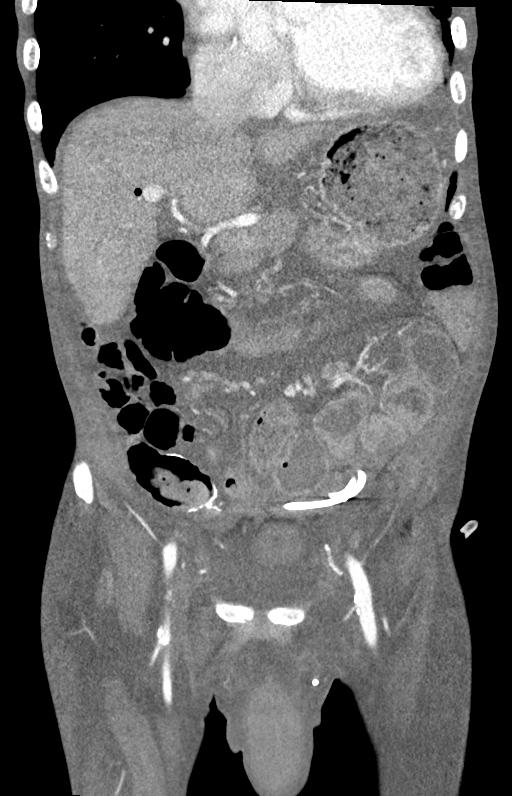
[im 72/129  soft-tissue]
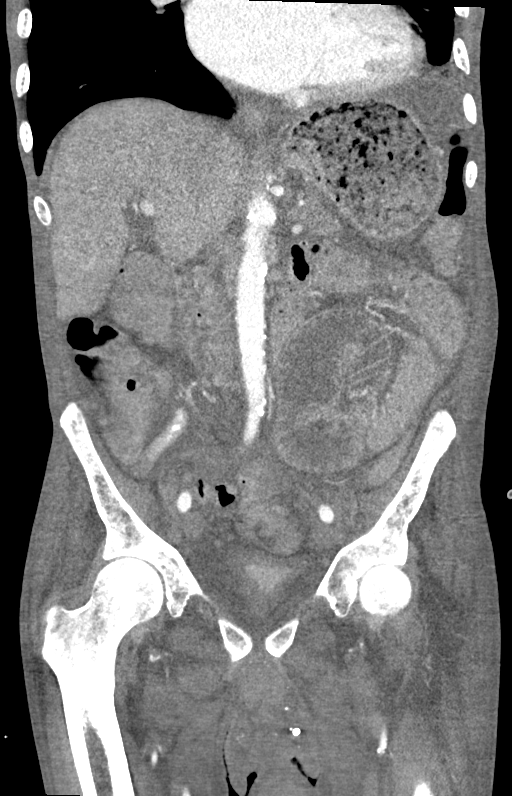

[15 of 46 positions shown; findings below may reference images not displayed]

RADIATION DOSE REDUCTION: This exam was performed according to the
departmental dose-optimization program which includes automated
exposure control, adjustment of the mA and/or kV according to
patient size and/or use of iterative reconstruction technique.

CONTRAST:  100mL OMNIPAQUE IOHEXOL 300 MG/ML  SOLN
FINDINGS: Lower chest: Global cardiomegaly. Trace bilateral pleural effusions.
Bibasilar subsegmental atelectasis.

Hepatobiliary: No focal liver abnormality is seen. Status post
cholecystectomy. No biliary dilatation.

Pancreas: Unremarkable. No pancreatic ductal dilatation or
surrounding inflammatory changes.

Spleen: Chronic atrophy with calcifications.

Adrenals/Urinary Tract: Adrenal glands are unremarkable. Similar
appearing multifocal bilateral simple renal cysts. Kidneys are
normal, without renal calculi, focal lesion, or hydronephrosis.
Bladder is mostly decompressed.

Stomach/Bowel: Stomach is within normal limits. Appendix is not
definitively identified. Right lower quadrant entero-enterostomy
anastomosis. Scattered areas of mildly dilated small bowel with a
few scattered air-fluid levels.

Vascular/Lymphatic: Aortic atherosclerosis. No enlarged abdominal or
pelvic lymph nodes.

Reproductive: Prostate is poorly visualized.

Other: Midline lower abdominal indwelling pigtail drain is in
unchanged position with interval resolution of the previously
visualized gas containing fluid collection.

Musculoskeletal: Similar appearing diffuse, heterogeneous sclerotic
appearance of the visualized axial skeleton. No acute osseous
abnormality.
IMPRESSION: 1. Interval resolution of previously visualized lower abdominal
abscess. Unchanged position of indwelling drain.
2. Mild distension of scattered areas of small bowel as could be
seen with ileus obstruction, though globally decreased from
comparison.
3. Similar appearing trace bilateral pleural effusions and bibasilar
subsegmental atelectasis.
4. Similar appearing global cardiomegaly.

## 2023-06-02 MED ORDER — DOXERCALCIFEROL 4 MCG/2ML IV SOLN
9.0000 ug | INTRAVENOUS | Status: DC
  Administered 2023-06-04 (×2): 9 ug via INTRAVENOUS
  Filled 2023-06-02 (×2): qty 6

## 2023-06-02 MED ORDER — SODIUM CHLORIDE 0.9 % IV SOLN
1.0000 g | INTRAVENOUS | Status: DC
  Administered 2023-06-02 – 2023-06-03 (×2): 1 g via INTRAVENOUS
  Filled 2023-06-02 (×2): qty 10

## 2023-06-02 MED ORDER — CINACALCET HCL 30 MG PO TABS: 30.0000 mg | ORAL_TABLET | ORAL | Status: DC

## 2023-06-02 NOTE — Consult Note (Addendum)
Renal Service Consult Note [facility]  Patrick [name] 06/02/2023 Maree Krabbe, MD Requesting Physician: Dr. [name]  Reason for Consult: ESRD pt w/ SOB and hypotension at dialysis HPI: The patient is a 67 y.o. year-old w/ PMH as below who presented from dialysis by EMS for hypotension and SOB. Pt rec'd about 1h of his HD yesterday then started to c/o SOB. BP's were low in the 80s, he was given IVF's. In ED VVS except BP 80s- 100s. Hb 5.9 and 1u prbcs ordered. Baseline around 8 -9. 100% on RA. For low BP's primary team put him on midodrine 5 bid ac. And pt was admitted. We are asked to see for dialysis.   Seen in room. Looks tired. No distress. No CP, SOB or leg swelling. Used to always carry a lot of fluid in his legs. Not today.   ROS - denies CP, no joint pain, no HA, no blurry vision, no rash, no diarrhea, no nausea/ vomiting, no dysuria, no difficulty voiding   Past Medical History  Past Medical History:  Diagnosis Date   A-fib (HCC)    Blood dyscrasia    Blood transfusion    "I've had a bunch of them"   CHF (congestive heart failure) (HCC)    followed by hf clinic   Chronic kidney disease    Stage 5, dialysis on Tu/Th/Sa   Dialysis patient (HCC) 04/2019   AV fistula (right arm), TTS   Dysrhythmia    A-Fib per patient   Early satiety    Elevated LFTs 04/01/2022   Elevated troponin 04/01/2022   Empyema of gallbladder 05/27/2021   GERD (gastroesophageal reflux disease)    Gout    Legally blind    Metabolic acidosis with increased anion gap and accumulation of organic acids 04/01/2022   Mitral regurgitation    Muscle tightness-Right arm  05/27/2014   Pneumonia    Pulmonary hypertension (HCC)    Shortness of breath dyspnea    Sickle cell disease, type SS (HCC)    Swelling abdomen    Swelling of extremity, left    Swelling of extremity, right    Tricuspid regurgitation    Past Surgical History  Past Surgical History:  Procedure Laterality Date    A/V FISTULAGRAM Right 07/29/2020   Procedure: A/V FISTULAGRAM - Right Arm;  Surgeon: Chuck Hint, MD;  Location: [facility] INVASIVE CV LAB;  Service: Cardiovascular;  Laterality: Right;   BASCILIC VEIN TRANSPOSITION Right 04/12/2014   Procedure: BASILIC VEIN TRANSPOSITION;  Surgeon: Sherren Kerns, MD;  Location: [facility] OR;  Service: Vascular;  Laterality: Right;   BILIARY STENT PLACEMENT  04/04/2022   Procedure: BILIARY STENT PLACEMENT;  Surgeon: Meryl Dare, MD;  Location: [facility] ENDOSCOPY;  Service: Gastroenterology;;   BIOPSY  08/04/2020   Procedure: BIOPSY;  Surgeon: Meryl Dare, MD;  Location: [facility] ENDOSCOPY;  Service: Gastroenterology;;   BIOPSY  06/28/2021   Procedure: BIOPSY;  Surgeon: Beverley Fiedler, MD;  Location: [facility] ENDOSCOPY;  Service: Gastroenterology;;   BIOPSY  04/02/2022   Procedure: BIOPSY;  Surgeon: Lemar Lofty., MD;  Location: [facility] ENDOSCOPY;  Service: Gastroenterology;;   BIOPSY  10/22/2022   Procedure: BIOPSY;  Surgeon: Beverley Fiedler, MD;  Location: [facility] ENDOSCOPY;  Service: Gastroenterology;;   BOWEL RESECTION N/A 05/24/2021   Procedure: SMALL BOWEL RESECTION;  Surgeon: Emelia Loron, MD;  Location: [facility] OR;  Service: General;  Laterality: N/A;   CARDIOVERSION N/A 06/05/2018   Procedure: CARDIOVERSION;  Surgeon: Arvilla Meres  R, MD;  Location: [facility] ENDOSCOPY;  Service: Cardiovascular;  Laterality: N/A;   CARDIOVERSION N/A 10/06/2019   Procedure: CARDIOVERSION;  Surgeon: Dolores Patty, MD;  Location: [facility] ENDOSCOPY;  Service: Cardiovascular;  Laterality: N/A;   CHOLECYSTECTOMY N/A 05/24/2021   Procedure: OPEN CHOLECYSTECTOMY;  Surgeon: Emelia Loron, MD;  Location: [facility] OR;  Service: General;  Laterality: N/A;   COLONOSCOPY WITH PROPOFOL N/A 09/24/2017   Procedure: COLONOSCOPY WITH PROPOFOL;  Surgeon: Lynann Bologna, MD;  Location: [facility] ENDOSCOPY;  Service: Endoscopy;  Laterality: N/A;   ENDOSCOPIC RETROGRADE CHOLANGIOPANCREATOGRAPHY (ERCP) WITH  PROPOFOL N/A 05/17/2022   Procedure: ENDOSCOPIC RETROGRADE CHOLANGIOPANCREATOGRAPHY (ERCP) WITH PROPOFOL;  Surgeon: Meridee Score Netty Starring., MD;  Location: [facility] ENDOSCOPY;  Service: Gastroenterology;  Laterality: N/A;   ERCP N/A 04/04/2022   Procedure: ENDOSCOPIC RETROGRADE CHOLANGIOPANCREATOGRAPHY (ERCP);  Surgeon: Meryl Dare, MD;  Location: [facility] ENDOSCOPY;  Service: Gastroenterology;  Laterality: N/A;   ESOPHAGOGASTRODUODENOSCOPY N/A 09/19/2017   Procedure: ESOPHAGOGASTRODUODENOSCOPY (EGD);  Surgeon: Lynann Bologna, MD;  Location: [facility] ENDOSCOPY;  Service: Endoscopy;  Laterality: N/A;   ESOPHAGOGASTRODUODENOSCOPY N/A 05/29/2021   Procedure: ESOPHAGOGASTRODUODENOSCOPY (EGD);  Surgeon: Rachael Fee, MD;  Location: [facility] ENDOSCOPY;  Service: Gastroenterology;  Laterality: N/A;   ESOPHAGOGASTRODUODENOSCOPY (EGD) WITH PROPOFOL N/A 08/04/2020   Procedure: ESOPHAGOGASTRODUODENOSCOPY (EGD) WITH PROPOFOL;  Surgeon: Meryl Dare, MD;  Location: [facility] ENDOSCOPY;  Service: Gastroenterology;  Laterality: N/A;   ESOPHAGOGASTRODUODENOSCOPY (EGD) WITH PROPOFOL N/A 06/28/2021   Procedure: ESOPHAGOGASTRODUODENOSCOPY (EGD) WITH PROPOFOL;  Surgeon: Beverley Fiedler, MD;  Location: [facility] ENDOSCOPY;  Service: Gastroenterology;  Laterality: N/A;   ESOPHAGOGASTRODUODENOSCOPY (EGD) WITH PROPOFOL N/A 08/10/2021   Procedure: ESOPHAGOGASTRODUODENOSCOPY (EGD) WITH PROPOFOL;  Surgeon: Lynann Bologna, MD;  Location: [facility] ENDOSCOPY;  Service: Gastroenterology;  Laterality: N/A;   ESOPHAGOGASTRODUODENOSCOPY (EGD) WITH PROPOFOL N/A 08/02/2021   Procedure: ESOPHAGOGASTRODUODENOSCOPY (EGD) WITH PROPOFOL;  Surgeon: Shellia Cleverly, DO;  Location: [facility] ENDOSCOPY;  Service: Gastroenterology;  Laterality: N/A;   ESOPHAGOGASTRODUODENOSCOPY (EGD) WITH PROPOFOL N/A 04/02/2022   Procedure: ESOPHAGOGASTRODUODENOSCOPY (EGD) WITH PROPOFOL;  Surgeon: Meridee Score Netty Starring., MD;  Location: [facility] ENDOSCOPY;  Service: Gastroenterology;  Laterality: N/A;    ESOPHAGOGASTRODUODENOSCOPY (EGD) WITH PROPOFOL N/A 05/17/2022   Procedure: ESOPHAGOGASTRODUODENOSCOPY (EGD) WITH PROPOFOL;  Surgeon: Meridee Score Netty Starring., MD;  Location: [facility] ENDOSCOPY;  Service: Gastroenterology;  Laterality: N/A;   ESOPHAGOGASTRODUODENOSCOPY (EGD) WITH PROPOFOL N/A 10/22/2022   Procedure: ESOPHAGOGASTRODUODENOSCOPY (EGD) WITH PROPOFOL;  Surgeon: Beverley Fiedler, MD;  Location: [facility] ENDOSCOPY;  Service: Gastroenterology;  Laterality: N/A;   EUS N/A 05/17/2022   Procedure: UPPER ENDOSCOPIC ULTRASOUND (EUS) RADIAL;  Surgeon: Lemar Lofty., MD;  Location: [facility] ENDOSCOPY;  Service: Gastroenterology;  Laterality: N/A;   FINE NEEDLE ASPIRATION  04/02/2022   Procedure: FINE NEEDLE ASPIRATION (FNA) LINEAR;  Surgeon: Lemar Lofty., MD;  Location: [facility] ENDOSCOPY;  Service: Gastroenterology;;   FINE NEEDLE ASPIRATION N/A 05/17/2022   Procedure: FINE NEEDLE ASPIRATION (FNA) LINEAR;  Surgeon: Lemar Lofty., MD;  Location: [facility] ENDOSCOPY;  Service: Gastroenterology;  Laterality: N/A;   HEMOSTASIS CLIP PLACEMENT  05/29/2021   Procedure: HEMOSTASIS CLIP PLACEMENT;  Surgeon: Rachael Fee, MD;  Location: [facility] ENDOSCOPY;  Service: Gastroenterology;;   HOT HEMOSTASIS N/A 05/29/2021   Procedure: HOT HEMOSTASIS (ARGON PLASMA COAGULATION/BICAP);  Surgeon: Rachael Fee, MD;  Location: [facility] ENDOSCOPY;  Service: Gastroenterology;  Laterality: N/A;   HOT HEMOSTASIS N/A 06/28/2021   Procedure: HOT HEMOSTASIS (ARGON PLASMA COAGULATION/BICAP);  Surgeon: Beverley Fiedler, MD;  Location: [facility] ENDOSCOPY;  Service: Gastroenterology;  Laterality: N/A;   HOT HEMOSTASIS  N/A 08/10/2021   Procedure: HOT HEMOSTASIS (ARGON PLASMA COAGULATION/BICAP);  Surgeon: Lynann Bologna, MD;  Location: [facility] ENDOSCOPY;  Service: Gastroenterology;  Laterality: N/A;   HOT HEMOSTASIS N/A 08/02/2021   Procedure: HOT HEMOSTASIS (ARGON PLASMA COAGULATION/BICAP);  Surgeon: Shellia Cleverly, DO;  Location: [facility] ENDOSCOPY;   Service: Gastroenterology;  Laterality: N/A;   IR PARACENTESIS  07/04/2022   IR PERC TUN PERIT CATH WO PORT S&I /IMAG  05/23/2021   IR REMOVAL TUN CV CATH W/O FL  06/10/2021   IR US GUIDE VASC ACCESS RIGHT  05/23/2021   LAPAROSCOPY N/A 05/24/2021   Procedure: LAPAROSCOPY DIAGNOSTIC;  Surgeon: Emelia Loron, MD;  Location: [facility] OR;  Service: General;  Laterality: N/A;   LAPAROTOMY N/A 05/24/2021   Procedure: EXPLORATORY LAPAROTOMY;  Surgeon: Emelia Loron, MD;  Location: [facility] OR;  Service: General;  Laterality: N/A;   LEFT AND RIGHT HEART CATHETERIZATION WITH CORONARY ANGIOGRAM N/A 06/11/2011   Procedure: LEFT AND RIGHT HEART CATHETERIZATION WITH CORONARY ANGIOGRAM;  Surgeon: Laurey Morale, MD;  Location: [facility] CATH LAB;  Service: Cardiovascular;  Laterality: N/A;   PERIPHERAL VASCULAR BALLOON ANGIOPLASTY Right 07/29/2020   Procedure: PERIPHERAL VASCULAR BALLOON ANGIOPLASTY;  Surgeon: Chuck Hint, MD;  Location: [facility] INVASIVE CV LAB;  Service: Cardiovascular;  Laterality: Right;  arm fistula   REMOVAL OF STONES  05/17/2022   Procedure: REMOVAL OF STONES;  Surgeon: Meridee Score Netty Starring., MD;  Location: [facility] ENDOSCOPY;  Service: Gastroenterology;;   REVISON OF ARTERIOVENOUS FISTULA Right 08/08/2020   Procedure: ANEURYSM EXCISION OF RIGHT UPPER EXTREMITY ARTERIOVENOUS FISTULA;  Surgeon: Chuck Hint, MD;  Location: [facility] OR;  Service: Vascular;  Laterality: Right;   REVISON OF ARTERIOVENOUS FISTULA Right 03/21/2022   Procedure: EXCISION OF ULCERATED SKIN OF RIGHT ARTERIOVENOUS FISTULA REVISION;  Surgeon: Cephus Shelling, MD;  Location: [facility] OR;  Service: Vascular;  Laterality: Right;   REVISON OF ARTERIOVENOUS FISTULA Right 07/02/2022   Procedure: EXCISION OF ULCERS RIGHT ARTERIOVENOUS FISTULA;  Surgeon: Chuck Hint, MD;  Location: [facility] OR;  Service: Vascular;  Laterality: Right;   SCLEROTHERAPY  05/29/2021   Procedure: Susa Day;  Surgeon: Rachael Fee, MD;   Location: [facility] ENDOSCOPY;  Service: Gastroenterology;;   Dennison Mascot  04/04/2022   Procedure: SPHINCTEROTOMY;  Surgeon: Meryl Dare, MD;  Location: [facility] ENDOSCOPY;  Service: Gastroenterology;;   Francine Graven REMOVAL  05/29/2021   Procedure: STENT REMOVAL;  Surgeon: Rachael Fee, MD;  Location: [facility] ENDOSCOPY;  Service: Gastroenterology;;   Francine Graven REMOVAL  05/17/2022   Procedure: STENT REMOVAL;  Surgeon: Lemar Lofty., MD;  Location: [facility] ENDOSCOPY;  Service: Gastroenterology;;   TEE WITHOUT CARDIOVERSION N/A 12/27/2015   Procedure: TRANSESOPHAGEAL ECHOCARDIOGRAM (TEE);  Surgeon: Quintella Reichert, MD;  Location: [facility] ENDOSCOPY;  Service: Cardiovascular;  Laterality: N/A;   UPPER ESOPHAGEAL ENDOSCOPIC ULTRASOUND (EUS) N/A 04/02/2022   Procedure: UPPER ESOPHAGEAL ENDOSCOPIC ULTRASOUND (EUS);  Surgeon: Lemar Lofty., MD;  Location: [facility] ENDOSCOPY;  Service: Gastroenterology;  Laterality: N/A;   Family History  Family History  Problem Relation Age of Onset   Alcohol abuse Mother    Liver disease Mother    Cirrhosis Mother    Heart attack Mother    Social History  reports that he quit smoking about 7 years ago. His smoking use included cigarettes. He started smoking about 48 years ago. He has a 20.5 pack-year smoking history. He has never used smokeless tobacco. He reports that he does not currently use alcohol after a past usage of about 3.0 standard drinks  of alcohol per week. He reports that he does not use drugs. Allergies No Known Allergies Home medications Prior to Admission medications   Medication Sig Start Date End Date Taking? Authorizing Provider  acetaminophen (TYLENOL) 500 MG tablet Take 1,500 mg by mouth 3 (three) times daily as needed for mild pain, moderate pain, fever or headache.   Yes [provider]  allopurinol (ZYLOPRIM) 100 MG tablet Take 1 tablet (100 mg total) by mouth daily. 11/07/22  Yes Ivonne Andrew, NP  AURYXIA 1 GM 210 MG(Fe) tablet Take  210-420 mg by mouth See admin instructions. 420 mg with every meal, 210 mg with each snack. 02/11/21  Yes [provider]  camphor-menthol Wynelle Fanny) lotion Apply 1 Application topically as needed for itching. 04/23/23  Yes Massie Maroon, FNP  cinacalcet (SENSIPAR) 30 MG tablet Take 1 tablet (30 mg total) by mouth Every Tuesday,Thursday,and Saturday with dialysis. 06/12/22  Yes Shahmehdi, Seyed A, MD  diphenhydramine-acetaminophen (TYLENOL PM) 25-500 MG TABS tablet Take 2 tablets by mouth at bedtime as needed (sleep/pain). 09/26/22  Yes Ivonne Andrew, NP  folic acid (FOLVITE) 1 MG tablet TAKE 1 TABLET BY MOUTH EVERY DAY 04/08/23  Yes Massie Maroon, FNP  hydroxyurea (HYDREA) 500 MG capsule Take 1 capsule (500 mg total) by mouth daily. 05/12/23  Yes Ivonne Andrew, NP  lidocaine-prilocaine (EMLA) cream Apply 1 application. topically Every Tuesday,Thursday,and Saturday with dialysis. 09/08/19  Yes [provider]  ofloxacin (OCUFLOX) 0.3 % ophthalmic solution Place 1 drop into both eyes 2 (two) times daily. 03/31/23  Yes [provider]  oxyCODONE (OXYCONTIN) 10 mg 12 hr tablet Take 1 tablet (10 mg total) by mouth every 12 (twelve) hours. 05/12/23  Yes Ivonne Andrew, NP  pantoprazole (PROTONIX) 40 MG tablet Take 1 tablet (40 mg total) by mouth 2 (two) times daily. X 2 months, then decrease to 1 tablet by mouth once daily therafter Patient taking differently: Take 40 mg by mouth daily. X 2 months, then decrease to 1 tablet by mouth once daily therafter 11/23/22  Yes Esterwood, Amy S, PA-C  prednisoLONE acetate (PRED FORTE) 1 % ophthalmic suspension Place 1 drop into both eyes 2 (two) times daily. 04/03/23  Yes [provider]  timolol (TIMOPTIC) 0.5 % ophthalmic solution Place 1 drop into both eyes 2 (two) times daily. 05/14/23  Yes [provider]  tiZANidine (ZANAFLEX) 4 MG tablet TAKE 1/2 TABLET (2MG  TOTAL) BY MOUTH EVERY 8 HOURS AS NEEDED FOR MUSCLE  SPASM 05/12/23  Yes Ivonne Andrew, NP  torsemide (DEMADEX) 20 MG tablet TAKE 2 TABLETS BY MOUTH IN THE MORNING AND AT BEDTIME 01/09/23  Yes Massie Maroon, FNP  lactulose (CHRONULAC) 10 GM/15ML solution Take 30 mLs (20 g total) by mouth 2 (two) times daily as needed for mild constipation. Patient not taking: Reported on 06/01/2023 10/23/22   Alberteen Sam, MD  Oxycodone HCl 10 MG TABS Take 1 tablet (10 mg total) by mouth every 6 (six) hours as needed. Patient not taking: Reported on 06/01/2023 05/12/23   Ivonne Andrew, NP  pantoprazole (PROTONIX) 40 MG tablet Take 1 tablet (40 mg total) by mouth daily. Use this prescription after done with twice daily dosing x 2 months Patient not taking: Reported on 06/01/2023 11/23/22   Sammuel Cooper, PA-C     Vitals:   06/02/23 0014 06/02/23 0047 06/02/23 0307 06/02/23 0744  BP: (!) 89/63 (!) 88/62 99/66 95/71   Pulse: 94 90 97 94  Resp: 18 16 18 18   Temp: 98.4 F (36.9 C) 97.9 F (36.6 C) 97.8 F (36.6 C) 98.1 F (36.7 C)  TempSrc: Oral Oral Oral Oral  SpO2: 96% 99% 98% 98%  Weight:      Height:       Exam Gen alert, no distress, pale, pleasant No rash, cyanosis or gangrene Sclera anicteric, throat clear  No jvd or bruits Chest clear bilat to bases, no rales/ wheezing RRR no MRG Abd soft ntnd no mass or ascites +bs GU nl male MS no joint effusions or deformity Ext no LE or UE edema, no wounds or ulcers Neuro is alert, Ox 3 , nf   RUA AVF+bruit    Renal-related home meds: - oxy IR prn - auryxia 2 ac tid - torsemide 20mg  bid - others: hydroxyurea, folic acid, allopurinol, PPI    OP HD: SW TTS  4h  400/500  63.4kg  2/2 bath  RUE AVF  Heparin none - last HD 12/14, post wt 67kg - coming off 1-2.5 kg over recently - sensipar 30, hectorol 9 - mircera 225 q 2, last 12/3, due 12/17  Assessment/ Plan: Symptomatic anemia - w/ Hb 5.9 here on admit w/ hx of sickle cell disease. Per pmd.  Hypotension - midodrine 5  mg tid started here by pmd ESKD - on HD TTS. Last HD Sat, was cut short due to hypotension. Labs and vol okay. Next HD 12/17.  Volume - about 2kg above by wts, euvolemic on exam.  Anemia of eskd - 5.9 --> 7.2 after 1u prbcs. Esa is due on 12/17, will order darbe 200 mcg sq weekly on Tuesday's while here.   MBD ckd - CCa in range. Get phos and cont binders w/ meals, sensipar, vdra.  Sickle cell disease Systolic CHF - EF 35-40%.  Atrial fib, permanent - not good candidate for amio or anticoagulation (see H&P).       Vinson Moselle  MD [facility] 06/02/2023, 4:48 PM  Recent Labs  Lab 06/01/23 1126 06/01/23 2036 06/02/23 0633  HGB 5.9* 5.9* 7.2*  ALBUMIN 3.2*  --   --   CALCIUM 8.5*  --  8.9  CREATININE 4.54*  --  6.20*  K 3.6  --  4.3   Inpatient medications:  allopurinol  100 mg Oral Daily   ferric citrate  420 mg Oral TID WC   folic acid  1 mg Oral Daily   hydroxyurea  500 mg Oral Daily   midodrine  5 mg Oral BID WC   oxyCODONE  10 mg Oral Q12H   pantoprazole (PROTONIX) IV  40 mg Intravenous Q12H    cefTRIAXone (ROCEPHIN)  IV 1 g (06/02/23 1127)   acetaminophen, ferric citrate, oxyCODONE

## 2023-06-02 NOTE — Care Management Obs Status (Signed)
MEDICARE OBSERVATION STATUS NOTIFICATION   Patient Details  Name: Patrick [name] MRN: [id number] Date of Birth: Jun 14, 1956   Medicare Observation Status Notification Given:  Yes    Ronny Bacon, RN 06/02/2023, 1:25 PM

## 2023-06-02 NOTE — Progress Notes (Addendum)
Summary: Patrick [name] is a 67 yo patient with a history of sickle cell disease, chronic pain, CKD 5 on HD Tu/Th/Sat, legally blind, history of GI bleed with angiodysplasia on EGD (07/2021), systolic CHF (LVEF 35-40%, 05/2022), permanent Afib not on anticoagulation 2/2 history of GI bleed, mitral valve mass (2017), decompensated liver cirrhosis with ascites, GERD, and gout who presented with hypotension and shortness of breath from his hemodialysis session and was admitted to the Internal Medicine Teaching Service on 12/14 for symptomatic anemia.   Subjective:  Overnight team paged regarding post transfusion H & H Hgb 5.9. Ordered an additional unit of PRBCs.   Patient is resting in bed this morning, conversing appropriately with team. He denies any acute pain or new symptoms, states he continues to feel better today compared to yesterday but still having his baseline bilateral extremity pain. Reported a BM this morning, with nursing stating it was normal and without concerns for bleeding/blood. Denies any vomiting, coughing with sputum/blood, or difficulties with voiding.   Objective:  Vital signs in last 24 hours: Vitals:   06/02/23 0014 06/02/23 0047 06/02/23 0307 06/02/23 0744  BP: (!) 89/63 (!) 88/62 99/66 95/71   Pulse: 94 90 97 94  Resp: 18 16 18 18   Temp: 98.4 F (36.9 C) 97.9 F (36.6 C) 97.8 F (36.6 C) 98.1 F (36.7 C)  TempSrc: Oral Oral Oral Oral  SpO2: 96% 99% 98% 98%  Weight:      Height:          Latest Ref Rng & Units 06/02/2023    6:33 AM 06/01/2023    8:36 PM 06/01/2023   11:26 AM  CBC  WBC 4.0 - 10.5 K/uL 5.8   7.6   Hemoglobin 13.0 - 17.0 g/dL 7.2  5.9  5.9   Hematocrit 39.0 - 52.0 % 19.3  16.0  15.8   Platelets 150 - 400 K/uL 110   114        Latest Ref Rng & Units 06/02/2023    6:33 AM 06/01/2023   11:26 AM 11/27/2022    7:45 AM  BMP  Glucose 70 - 99 mg/dL 86  474  84   BUN 8 - 23 mg/dL 36  25  68   Creatinine 0.61 - 1.24 mg/dL 2.59  5.63   8.75   Sodium 135 - 145 mmol/L 133  133  126   Potassium 3.5 - 5.1 mmol/L 4.3  3.6  4.5   Chloride 98 - 111 mmol/L 95  92  90   CO2 22 - 32 mmol/L 22  26  24    Calcium 8.9 - 10.3 mg/dL 8.9  8.5  8.6    Physical Exam Constitutional: Patient is resting comfortably in bed in no acute distress.  CV: Regular rate and rhythm without murmurs on auscultation. No LE edema.  Pulmonary/Respiratory: Normal respiratory effort on room air, clear lungs bilaterally.  Abdominal: Soft, non-distended, slightly tender on RUQ palpation, spleen appears to be normal size.  Skin: Warm and dry. Psych: Normal mood and affect.   Assessment/Plan:  Active Problems:   Symptomatic anemia  Symptomatic anemia  Hypotension  Patient's post H & H this morning following 2nd unit of PRBCs 7.2, with a baseline Hgb 8-9. Patient continues to deny any hematuria, hematochezia, or hematemesis. No overt bleeding on exam. With patient's Hgb staying the same (5.9) s/p 1 unit of PRBC and significant history of GI bleed and angiodysplasia on endoscopy, team consulted GI. Per GI, will remain  peripherally aware of patient and team can contact if there is new overt bleeding or patient's Hgb fails to respond transfusion appropriately. Next Hgb will be drawn around 2 PM this afternoon. Of note, patient's transfusion threshold has been Hgb <6 in the past.   History of low BP. Blood pressures remain on the lower side today between 80s-90s systolic and 60s-70s diastolic. Will continue midodrine 5 mg BID with meals.  Plan:  - Follow up 2 PM CBC/Hgb  - Transfuse if Hgb < 6, transfuse if Hgb < 7 and patient is symptomatic   - Daily CBC  - IV PPI  - Midodrine 5 mg BID with meals    Decompensated liver cirrhosis History of ascites  Patient does not present with signs or symptoms concerning for variceal bleed. Did express RUQ tenderness on palpation. Will start IV antibiotic for SBP prophylaxis.  - IV ceftriaxone x 7 days     Sickle Cell  Disease  Follows with Dr. Hyman Hopes, MD and Julianne Handler, FNP regularly. Resumed home folic acid and pain regimen. Chronic bilateral leg pain today seems to be at baseline.  - Folic acid 1 mg daily  - Hydroxyurea 500 mg daily - Tylenol 650 mg q6H PRN  - Scheduled Oxycodone/Oxycontin 10 mg q12H  - Oxycodone HCl 10 mg q6H PRN    CKD 5 Patient goes to HD Tuesdays, Thursdays, and Saturdays. Last HD session cut short on day of admission 12/14. Serum creatinine today 6.20 from 4.54 on admission. Consulted with nephrology for HD while patient is here.  - Continue home ferric citrate/Auryxia  - F/u nephrology recommendations   Chronic systolic CHF (LVEF 35-40%) Patient has been seen by Dr. Teressa Lower in the past, but not for some time. GDMT limited by CKD, hypotension. Volume mostly managed by HD, takes torsemide 20 mg BID at home. Holding torsemide 20 mg BID here as patient seems to be euvolemic with low BP systolic 80s-90s/50s-60s. Will repeat echocardiogram during admission given low BP and follow up with cardiology consult inpatient as needed.  - Ordered echocardiogram, follow up results, consult cardiology if abnormal    Permanent Afib not on anticoagulation  Patient with DCCV with successful conversion to NSR April 2021, back in atrial flutter May 2021. Referred to Afib clinic, per previous cardiology consult note not a good candidate for amiodarone, was on Eliquis at some point but this was discontinued due to upper GI bleed January 2023. Not on AV nodal blocking medications or anticoagulation at home. Rate controlled here. Will monitor.    Gout Chronic. Resumed home allopurinol 100 mg daily.    Diet: Clear liquids Code status: Full Code VTE prophylaxis: SCDs  Prior to Admission Living Arrangement: Home Anticipated Discharge Location: pending medical management Barriers to Discharge: medical management  Dispo: Anticipated discharge in approximately 1-2 day(s).   Philomena Doheny, MD, PGY-1 06/02/2023, 12:44 PM Pager: [phone] After 5pm on weekdays and 1pm on weekends: On Call pager [phone]

## 2023-06-02 NOTE — Plan of Care (Signed)
  Problem: Clinical Measurements: Goal: Respiratory complications will improve Outcome: Progressing Goal: Cardiovascular complication will be avoided Outcome: Progressing   Problem: Activity: Goal: Risk for activity intolerance will decrease Outcome: Progressing   Problem: Nutrition: Goal: Adequate nutrition will be maintained Outcome: Progressing   Problem: Coping: Goal: Level of anxiety will decrease Outcome: Progressing   Problem: Pain Management: Goal: General experience of comfort will improve Outcome: Progressing

## 2023-06-03 ENCOUNTER — Encounter (HOSPITAL_COMMUNITY): Payer: HMO

## 2023-06-03 ENCOUNTER — Observation Stay (HOSPITAL_COMMUNITY): Payer: PPO

## 2023-06-03 DIAGNOSIS — Z8249 Family history of ischemic heart disease and other diseases of the circulatory system: Secondary | ICD-10-CM | POA: Diagnosis not present

## 2023-06-03 DIAGNOSIS — I081 Rheumatic disorders of both mitral and tricuspid valves: Secondary | ICD-10-CM | POA: Diagnosis not present

## 2023-06-03 DIAGNOSIS — I132 Hypertensive heart and chronic kidney disease with heart failure and with stage 5 chronic kidney disease, or end stage renal disease: Secondary | ICD-10-CM | POA: Diagnosis not present

## 2023-06-03 DIAGNOSIS — I9589 Other hypotension: Secondary | ICD-10-CM

## 2023-06-03 DIAGNOSIS — Z79891 Long term (current) use of opiate analgesic: Secondary | ICD-10-CM | POA: Diagnosis not present

## 2023-06-03 DIAGNOSIS — K219 Gastro-esophageal reflux disease without esophagitis: Secondary | ICD-10-CM | POA: Diagnosis not present

## 2023-06-03 DIAGNOSIS — R188 Other ascites: Secondary | ICD-10-CM | POA: Diagnosis not present

## 2023-06-03 DIAGNOSIS — N2581 Secondary hyperparathyroidism of renal origin: Secondary | ICD-10-CM | POA: Diagnosis not present

## 2023-06-03 DIAGNOSIS — G8929 Other chronic pain: Secondary | ICD-10-CM | POA: Diagnosis not present

## 2023-06-03 DIAGNOSIS — K7460 Unspecified cirrhosis of liver: Secondary | ICD-10-CM | POA: Diagnosis not present

## 2023-06-03 DIAGNOSIS — I4821 Permanent atrial fibrillation: Secondary | ICD-10-CM | POA: Diagnosis not present

## 2023-06-03 DIAGNOSIS — I959 Hypotension, unspecified: Secondary | ICD-10-CM | POA: Diagnosis not present

## 2023-06-03 DIAGNOSIS — H548 Legal blindness, as defined in USA: Secondary | ICD-10-CM | POA: Diagnosis not present

## 2023-06-03 DIAGNOSIS — N250 Renal osteodystrophy: Secondary | ICD-10-CM | POA: Diagnosis not present

## 2023-06-03 DIAGNOSIS — D631 Anemia in chronic kidney disease: Secondary | ICD-10-CM | POA: Diagnosis not present

## 2023-06-03 DIAGNOSIS — I272 Pulmonary hypertension, unspecified: Secondary | ICD-10-CM | POA: Diagnosis not present

## 2023-06-03 DIAGNOSIS — M1A9XX0 Chronic gout, unspecified, without tophus (tophi): Secondary | ICD-10-CM | POA: Diagnosis not present

## 2023-06-03 DIAGNOSIS — Z79899 Other long term (current) drug therapy: Secondary | ICD-10-CM | POA: Diagnosis not present

## 2023-06-03 DIAGNOSIS — Z992 Dependence on renal dialysis: Secondary | ICD-10-CM | POA: Diagnosis not present

## 2023-06-03 DIAGNOSIS — D649 Anemia, unspecified: Secondary | ICD-10-CM | POA: Diagnosis not present

## 2023-06-03 DIAGNOSIS — N186 End stage renal disease: Secondary | ICD-10-CM | POA: Diagnosis not present

## 2023-06-03 DIAGNOSIS — Z833 Family history of diabetes mellitus: Secondary | ICD-10-CM | POA: Diagnosis not present

## 2023-06-03 DIAGNOSIS — I5020 Unspecified systolic (congestive) heart failure: Secondary | ICD-10-CM | POA: Diagnosis not present

## 2023-06-03 DIAGNOSIS — Z8719 Personal history of other diseases of the digestive system: Secondary | ICD-10-CM | POA: Diagnosis not present

## 2023-06-03 DIAGNOSIS — R0602 Shortness of breath: Secondary | ICD-10-CM | POA: Diagnosis not present

## 2023-06-03 DIAGNOSIS — I953 Hypotension of hemodialysis: Secondary | ICD-10-CM | POA: Diagnosis not present

## 2023-06-03 DIAGNOSIS — D571 Sickle-cell disease without crisis: Secondary | ICD-10-CM | POA: Diagnosis not present

## 2023-06-03 DIAGNOSIS — M898X9 Other specified disorders of bone, unspecified site: Secondary | ICD-10-CM | POA: Diagnosis not present

## 2023-06-03 DIAGNOSIS — Z87891 Personal history of nicotine dependence: Secondary | ICD-10-CM | POA: Diagnosis not present

## 2023-06-03 DIAGNOSIS — I5022 Chronic systolic (congestive) heart failure: Secondary | ICD-10-CM | POA: Diagnosis not present

## 2023-06-03 LAB — ECHOCARDIOGRAM COMPLETE
Area-P 1/2: 5.66 cm2
Calc EF: 57.3 %
Height: 71 in
MV M vel: 4.58 m/s
MV Peak grad: 83.9 mm[Hg]
Radius: 0.5 cm
S' Lateral: 4.3 cm
Single Plane A2C EF: 55 %
Single Plane A4C EF: 60.1 %
Weight: 2304 [oz_av]

## 2023-06-03 LAB — BASIC METABOLIC PANEL
Anion gap: 15 (ref 5–15)
BUN: 46 mg/dL — ABNORMAL HIGH (ref 8–23)
CO2: 22 mmol/L (ref 22–32)
Calcium: 8.6 mg/dL — ABNORMAL LOW (ref 8.9–10.3)
Chloride: 92 mmol/L — ABNORMAL LOW (ref 98–111)
Creatinine, Ser: 7.6 mg/dL — ABNORMAL HIGH (ref 0.61–1.24)
GFR, Estimated: 7 mL/min — ABNORMAL LOW (ref >60)
Glucose, Bld: 93 mg/dL (ref 70–99)
Potassium: 4.2 mmol/L (ref 3.5–5.1)
Sodium: 129 mmol/L — ABNORMAL LOW (ref 135–145)

## 2023-06-03 LAB — CBC
HCT: 18.3 % — ABNORMAL LOW (ref 39.0–52.0)
Hemoglobin: 6.8 g/dL — CL (ref 13.0–17.0)
MCH: 38 pg — ABNORMAL HIGH (ref 26.0–34.0)
MCHC: 37.2 g/dL — ABNORMAL HIGH (ref 30.0–36.0)
MCV: 102.2 fL — ABNORMAL HIGH (ref 80.0–100.0)
Platelets: 100 10*3/uL — ABNORMAL LOW (ref 150–400)
RBC: 1.79 MIL/uL — ABNORMAL LOW (ref 4.22–5.81)
RDW: 22.2 % — ABNORMAL HIGH (ref 11.5–15.5)
WBC: 5.7 10*3/uL (ref 4.0–10.5)
nRBC: 8.1 % — ABNORMAL HIGH (ref 0.0–0.2)

## 2023-06-03 LAB — HEPATITIS B SURFACE ANTIGEN: Hepatitis B Surface Ag: NONREACTIVE

## 2023-06-03 MED ORDER — ALLOPURINOL 100 MG PO TABS
100.0000 mg | ORAL_TABLET | ORAL | Status: DC
Start: 2023-06-04 — End: 2023-06-04
  Administered 2023-06-04: 100 mg via ORAL
  Filled 2023-06-03: qty 1

## 2023-06-03 MED ORDER — MIDODRINE HCL 5 MG PO TABS
5.0000 mg | ORAL_TABLET | Freq: Three times a day (TID) | ORAL | Status: DC
  Administered 2023-06-03 – 2023-06-04 (×3): 5 mg via ORAL
  Filled 2023-06-03 (×3): qty 1

## 2023-06-03 MED ORDER — CHLORHEXIDINE GLUCONATE CLOTH 2 % EX PADS: 6.0000 | MEDICATED_PAD | Freq: Every day | CUTANEOUS | Status: DC

## 2023-06-03 NOTE — Progress Notes (Signed)
[facility] Progress Note   Subjective:   Seen in room - feels ok today. No CP/dyspnea. S/p 2U PRBCs yesterday. Hgb remains low. Known SSD.  Objective Vitals:   06/02/23 1732 06/02/23 1952 06/03/23 0508 06/03/23 0836  BP: 109/70 92/62 90/68  (!) 96/53  Pulse: 72 67 85 76  Resp: 17 18 18 18   Temp:  97.8 F (36.6 C) 98 F (36.7 C) 97.8 F (36.6 C)  TempSrc:   Oral Oral  SpO2: (!) 77% 97% 98% 98%  Weight:      Height:       Physical Exam General: Well appearing man, NAD. Room air. Blind. Heart: RRR; no murmur Lungs: CTA anteriorly Abdomen: soft Extremities: no LE edema Dialysis Access:  RUE AVF  Additional Objective Labs: Basic Metabolic Panel: Recent Labs  Lab 06/01/23 1126 06/02/23 0633 06/02/23 0649 06/03/23 0339  NA 133* 133*  --  129*  K 3.6 4.3  --  4.2  CL 92* 95*  --  92*  CO2 26 22  --  22  GLUCOSE 108* 86  --  93  BUN 25* 36*  --  46*  CREATININE 4.54* 6.20*  --  7.60*  CALCIUM 8.5* 8.9  --  8.6*  PHOS  --   --  8.7*  --    Liver Function Tests: Recent Labs  Lab 06/01/23 1126  AST 47*  ALT 20  ALKPHOS 165*  BILITOT 1.8*  PROT 7.2  ALBUMIN 3.2*   CBC: Recent Labs  Lab 06/01/23 1126 06/01/23 2036 06/02/23 0633 06/02/23 1347 06/03/23 0339  WBC 7.6  --  5.8 6.7 5.7  NEUTROABS 5.1  --   --   --   --   HGB 5.9*   < > 7.2* 7.4* 6.8*  HCT 15.8*   < > 19.3* RESULTS UNAVAILABLE DUE TO INTERFERING SUBSTANCE 18.3*  MCV 111.3*  --  102.1* RESULTS UNAVAILABLE DUE TO INTERFERING SUBSTANCE 102.2*  PLT 114*  --  110* 112* 100*   < > = values in this interval not displayed.   Blood Culture    Component Value Date/Time   SDES EXPECTORATED SPUTUM 07/25/2022 0800   SPECREQUEST NONE 07/25/2022 0800   CULT  07/24/2022 0053    NO GROWTH 5 DAYS Performed at [facility], [address], [place], [place] [address]    REPTSTATUS 07/25/2022 FINAL 07/25/2022 0800   Studies/Results: ECHOCARDIOGRAM COMPLETE Result Date: 06/03/2023     ECHOCARDIOGRAM REPORT   Patient Name:   Patrick [name] [facility] Date of Exam: 06/03/2023 Medical Rec #:  [id number]           Height:       71.0 in Accession #:    [id number]          Weight:       144.0 lb Date of Birth:  [birth year]           BSA:          1.834 m Patient Age:    67 years            BP:           96/53 mmHg Patient Gender: M                   HR:           63 bpm. Exam Location:  Inpatient Procedure: 2D Echo, Cardiac Doppler and Color Doppler Indications:    hypotension  History:  Patient has prior history of Echocardiogram examinations, most                 recent 06/08/2022. CHF, Abnormal ECG; Arrythmias:Atrial                 Fibrillation. ESRD.  Sonographer:    Webb Laws Referring Phys: [id number] GRACE LAU IMPRESSIONS  1. Left ventricular ejection fraction, by estimation, is 40 to 45%. The left ventricle has mildly decreased function. The left ventricle demonstrates global hypokinesis. The left ventricular internal cavity size was mildly to moderately dilated. There is moderate concentric left ventricular hypertrophy. Left ventricular diastolic function could not be evaluated. The average left ventricular global longitudinal strain is -8.5 %. The global longitudinal strain is abnormal.  2. Right ventricular systolic function is mildly reduced. The right ventricular size is normal.  3. Left atrial size was severely dilated.  4. Right atrial size was severely dilated.  5. The mitral valve is normal in structure. Severe mitral valve regurgitation. No evidence of mitral stenosis.  6. Tricuspid valve regurgitation is severe.  7. The aortic valve is tricuspid. There is mild calcification of the aortic valve. Aortic valve regurgitation is not visualized. Aortic valve sclerosis/calcification is present, without any evidence of aortic stenosis.  8. Aortic dilatation noted. There is borderline dilatation of the aortic root, measuring 38 mm.  9. The inferior vena cava is dilated in size with <50%  respiratory variability, suggesting right atrial pressure of 15 mmHg. FINDINGS  Left Ventricle: Left ventricular ejection fraction, by estimation, is 40 to 45%. The left ventricle has mildly decreased function. The left ventricle demonstrates global hypokinesis. The average left ventricular global longitudinal strain is -8.5 %. The  global longitudinal strain is abnormal. The left ventricular internal cavity size was mildly to moderately dilated. There is moderate concentric left ventricular hypertrophy. Left ventricular diastolic function could not be evaluated due to atrial fibrillation. Left ventricular diastolic function could not be evaluated. Right Ventricle: The right ventricular size is normal. No increase in right ventricular wall thickness. Right ventricular systolic function is mildly reduced. Left Atrium: Left atrial size was severely dilated. Right Atrium: Right atrial size was severely dilated. Pericardium: Trivial pericardial effusion is present. The pericardial effusion is localized near the right atrium. Mitral Valve: The mitral valve is normal in structure. Severe mitral valve regurgitation. No evidence of mitral valve stenosis. Tricuspid Valve: The tricuspid valve is normal in structure. Tricuspid valve regurgitation is severe. No evidence of tricuspid stenosis. Aortic Valve: The aortic valve is tricuspid. There is mild calcification of the aortic valve. Aortic valve regurgitation is not visualized. Aortic valve sclerosis/calcification is present, without any evidence of aortic stenosis. Pulmonic Valve: The pulmonic valve was normal in structure. Pulmonic valve regurgitation is mild. No evidence of pulmonic stenosis. Aorta: Aortic dilatation noted. There is borderline dilatation of the aortic root, measuring 38 mm. Venous: The inferior vena cava is dilated in size with less than 50% respiratory variability, suggesting right atrial pressure of 15 mmHg. IAS/Shunts: No atrial level shunt detected by  color flow Doppler. Additional Comments: A device lead is visualized.  LEFT VENTRICLE PLAX 2D LVIDd:         6.10 cm      Diastology LVIDs:         4.30 cm      LV e' medial:    6.53 cm/s LV PW:         1.40 cm      LV E/e' medial:  20.2 LV IVS:        1.30 cm      LV e' lateral:   14.10 cm/s LVOT diam:     2.10 cm      LV E/e' lateral: 9.4 LV SV:         64 LV SV Index:   35           2D Longitudinal Strain LVOT Area:     3.46 cm     2D Strain GLS Avg:     -8.5 %  LV Volumes (MOD) LV vol d, MOD A2C: 125.0 ml 3D Volume EF: LV vol d, MOD A4C: 129.0 ml 3D EF:        61 % LV vol s, MOD A2C: 56.2 ml  LV EDV:       151 ml LV vol s, MOD A4C: 51.5 ml  LV ESV:       59 ml LV SV MOD A2C:     68.8 ml  LV SV:        91 ml LV SV MOD A4C:     129.0 ml LV SV MOD BP:      74.5 ml RIGHT VENTRICLE RV Basal diam:  4.90 cm RV Mid diam:    3.00 cm RV S prime:     10.00 cm/s TAPSE (M-mode): 1.8 cm LEFT ATRIUM              Index        RIGHT ATRIUM           Index LA diam:        6.00 cm  3.27 cm/m   RA Area:     24.70 cm LA Vol (A2C):   172.0 ml 93.79 ml/m  RA Volume:   78.90 ml  43.03 ml/m LA Vol (A4C):   183.0 ml 99.79 ml/m LA Biplane Vol: 181.0 ml 98.70 ml/m  AORTIC VALVE LVOT Vmax:   111.00 cm/s LVOT Vmean:  73.700 cm/s LVOT VTI:    0.186 m  AORTA Ao Root diam: 3.70 cm Ao Asc diam:  3.80 cm MITRAL VALVE                  TRICUSPID VALVE MV Area (PHT): 5.66 cm       TR Peak grad:   53.9 mmHg MV Decel Time: 134 msec       TR Vmax:        367.00 cm/s MR Peak grad:    83.9 mmHg MR Mean grad:    54.0 mmHg    SHUNTS MR Vmax:         458.00 cm/s  Systemic VTI:  0.19 m MR Vmean:        350.0 cm/s   Systemic Diam: 2.10 cm MR PISA:         1.57 cm MR PISA Eff ROA: 12 mm MR PISA Radius:  0.50 cm MV E velocity: 132.00 cm/s Arvilla Meres MD Electronically signed by Arvilla Meres MD Signature Date/Time: 06/03/2023/9:31:06 AM    Final    Medications:   allopurinol  100 mg Oral Daily   [START ON 06/04/2023] cinacalcet  30 mg  Oral Q T,Th,Sat-1800   [START ON 06/04/2023] doxercalciferol  9 mcg Intravenous Q T,Th,Sa-HD   ferric citrate  420 mg Oral TID WC   folic acid  1 mg Oral Daily   hydroxyurea  500 mg Oral Daily   midodrine  5 mg Oral TID WC   oxyCODONE  10 mg Oral Q12H   pantoprazole (PROTONIX) IV  40 mg Intravenous Q12H    Dialysis Orders: SW TTS  4h  400/500  63.4kg  2/2 bath  RUE AVF  Heparin none - last HD 12/14, post wt 67kg - coming off 1-2.5 kg over recently - sensipar 30, hectorol 9 - mircera 225 q 2, last 12/3, due 12/17  Assessment/Plan: Symptomatic anemia: Hgb 5.9 on admit, Hx sickle cell dz, on hydroxyurea. S/p 2U PRBCs. On max ESA as outpatient - continue Aranesp weekly during admit. Hypotension/volume: Midodrine 5mg  BID started this admit, follow. May need to increase. Echo showing HFrEF. EDW seems ok for now. ESRD: Continue HD on usual TTS schedule - HD tomorrow. No heparin. Secondary HPTH: CorrCa ok, Phos high - resume binders/VDRA/sensipar.  Sickle cell disease Systolic CHF - EF 35-40%.  Atrial fibrillation: Not on AC.  Ozzie Hoyle, PA-C 06/03/2023, 11:10 AM  [employer]

## 2023-06-03 NOTE — TOC CM/SW Note (Signed)
Transition of Care [facility]) - Inpatient Brief Assessment   Patient Details  Name: Patrick [name] MRN: [id number] Date of Birth: 08-26-1955  Transition of Care [facility]) CM/SW Contact:    Tom-Johnson, Hershal Coria, RN Phone Number: 06/03/2023, 4:43 PM   Clinical Narrative:  Patient presented to the ED from Outpatient Dialysis with Hypotension with Hgb of 5.9, SOB and Bilateral Leg Numbness. 2U PRBC given, Hgb improved to 7.4 yesterday, today at 6.8. Patient has hx of Legally Blind, Systolic CHF Sickle Cell Disease, Chronic Pain, CKD 5 on HD TTS schedule, GI Bleed with Angiodysplasia Permanent A-fib, not on anticoagulation, Mitral Valve Mass, Decompensated Liver Cirrhosis with Ascites, GERD, and Gout.   Nephrology following for inpatient HD.   CM spoke with patient at bedside about [facility] Transition.  From home alone, states he does not have children and no siblings living. Has one Niece and three Nephew who lives in [place].  Currently employed at the [employer]. Uses [employer] to his appointments and work. 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  PCP is Quentin Angst, MD and uses [facility] on [address]  Cousin, Abir to transport at discharge.   Patient not Medically ready for discharge.  CM will continue to follow as patient progresses with care towards discharge.        Subjective:        Transition of Care Asessment: Insurance and Status: Insurance coverage has been reviewed Patient has primary care physician: Yes Home environment has been reviewed: Yes Prior level of function:: Independently Blind Prior/Current Home Services: No current home services Social Drivers of Health Review: SDOH reviewed no interventions necessary Readmission risk has been reviewed: Yes Transition of care needs: no transition of care needs at this time

## 2023-06-03 NOTE — Progress Notes (Addendum)
Summary: Patrick [name] is a 67 yo patient with a history of sickle cell disease, chronic pain, CKD 5 on HD Tu/Th/Sat, legally blind, history of GI bleed with angiodysplasia on EGD (07/2021), systolic CHF (LVEF 35-40%, 05/2022), permanent Afib not on anticoagulation 2/2 history of GI bleed, mitral valve mass (2017), decompensated liver cirrhosis with ascites, GERD, and gout who presented with hypotension and shortness of breath from his hemodialysis session and was admitted to the Internal Medicine Teaching Service on 12/14 for Hypotension.   Subjective:  Patient denies chest pain, SOB, lightheaded/ dizziness today. He reports sx of lightheadedness during HD due to HTN in the past. He does not recall his BP at his HD sessions when he felt lightheaded. He denies hematuria, hematochezia, or melena.   Objective:  Vital signs in last 24 hours: Vitals:   06/02/23 1732 06/02/23 1952 06/03/23 0508 06/03/23 0836  BP: 109/70 92/62 90/68  (!) 96/53  Pulse: 72 67 85 76  Resp: 17 18 18 18   Temp:  97.8 F (36.6 C) 98 F (36.7 C) 97.8 F (36.6 C)  TempSrc:   Oral Oral  SpO2: (!) 77% 97% 98% 98%  Weight:      Height:          Latest Ref Rng & Units 06/03/2023    3:39 AM 06/02/2023    1:47 PM 06/02/2023    6:33 AM  CBC  WBC 4.0 - 10.5 K/uL 5.7  6.7  5.8   Hemoglobin 13.0 - 17.0 g/dL 6.8  7.4  7.2   Hematocrit 39.0 - 52.0 % 18.3  RESULTS UNAVAILABLE DUE TO INTERFERING SUBSTANCE  19.3   Platelets 150 - 400 K/uL 100  112  110        Latest Ref Rng & Units 06/03/2023    3:39 AM 06/02/2023    6:33 AM 06/01/2023   11:26 AM  BMP  Glucose 70 - 99 mg/dL 93  86  621   BUN 8 - 23 mg/dL 46  36  25   Creatinine 0.61 - 1.24 mg/dL 3.08  6.57  8.46   Sodium 135 - 145 mmol/L 129  133  133   Potassium 3.5 - 5.1 mmol/L 4.2  4.3  3.6   Chloride 98 - 111 mmol/L 92  95  92   CO2 22 - 32 mmol/L 22  22  26    Calcium 8.9 - 10.3 mg/dL 8.6  8.9  8.5    Physical Exam Physical Exam: General: laying in  bed, NAD Cardiac:regular rate and rhythm  Pulmonary:clear to auscultate bilaterally Abdominal:soft, non tender, bowel sounds present Neuro:alert, awake, moving all four extremities  MSK:no pitting edema present, radial and DP pulses are present bilaterally and are symmetrical, RUE fistula present with thrill and bruit auscultated   Skin:warm and dry Psych:  normal mood and affect   Assessment/Plan:  Principal Problem:   Symptomatic anemia Active Problems:   ESRD (end stage renal disease) (HCC)   Cirrhosis (HCC)   Hypotension  Symptomatic Hypotension  History of low BP. Blood pressures remain on the lower side today between 80s-90s systolic and 50s-60s diastolic. Will continue midodrine 5 mg TID with meals.  Plan:  - Midodrine 5 mg TID with meals  - Will reassess patient's BP with HD tomorrow, if patient is able to tolerate HD, will consider d/c tomorrow  - Orthostatic vital signs daily    Decompensated liver cirrhosis History of ascites  Patient does not present with signs or symptoms concerning  for variceal bleed. Denied overt sx of bleeding however; this may be difficult to ascertain due to patient's vision impairment  -Will d/c ceftriaxone -Low suspicion for GI bleed at this moment    -D/c PPI IV  Symptomatic Anemia  Sickle Cell Disease  Baseline Hgb 8-9, Hgb this morning is at 6.8. Patient's transfusion threshold has been Hgb <6 in the past.  Follows with Dr. Hyman Hopes, MD and Julianne Handler, FNP regularly. Resumed home folic acid and pain regimen. Chronic bilateral leg pain today seems to be at baseline.  - Folic acid 1 mg daily  - Hydroxyurea 500 mg daily - Tylenol 650 mg q6H PRN  - Scheduled Oxycodone/Oxycontin 10 mg q12H  - Oxycodone HCl 10 mg q6H PRN  -Transfuse if Hgb < 6, transfuse if Hgb < 7 and patient is symptomatic     CKD 5 Patient goes to HD Tuesdays, Thursdays, and Saturdays. Last HD session cut short on day of admission 12/14. Consulted with nephrology for  HD while patient is here.  - Continue home ferric citrate/Auryxia  - F/u nephrology recommendations   HFrEF (LVEF 40-45%) 06/03/23 Patient has been seen by Dr. Teressa Lower in the past, but not for some time. GDMT limited by CKD, hypotension. Volume mostly managed by HD, takes torsemide 20 mg BID at home.  -Holding torsemide 20 mg BID here as patient seems to be euvolemic with low BP systolic 80s-90s/50s-60s.  -LVEF improved from 35-40% 05/2022 to 40-45%   Permanent Afib not on anticoagulation  Patient with DCCV with successful conversion to NSR April 2021, back in atrial flutter May 2021. Referred to Afib clinic, per previous cardiology consult note not a good candidate for amiodarone, was on Eliquis at some point but this was discontinued due to upper GI bleed January 2023. Not on AV nodal blocking medications or anticoagulation at home. Rate controlled here. Will monitor.   Gout Chronic. Begin renal dosing while on HD, 100 mg after HD sessions    Diet: Clear liquids Code status: Full Code VTE prophylaxis: SCDs  Prior to Admission Living Arrangement: Home Anticipated Discharge Location: pending medical management Barriers to Discharge: medical management  Dispo: Anticipated discharge in approximately 1-2 day(s).   Faith Rogue, DO, PGY-1 06/03/2023, 11:26 AM Pager: [phone] After 5pm on weekdays and 1pm on weekends: On Call pager [phone]

## 2023-06-03 NOTE — Progress Notes (Signed)
Orthostatic BP:  Lying 91/58 HR 68  Sitting 103/58 HR 84  Standing 96/64 HR 86

## 2023-06-03 NOTE — Progress Notes (Signed)
  Pt refuses to get morning daily blood sugar check. Notified MD.

## 2023-06-03 NOTE — Plan of Care (Signed)
  Problem: Education: Goal: Knowledge of General Education information will improve Description Including pain rating scale, medication(s)/side effects and non-pharmacologic comfort measures Outcome: Progressing   

## 2023-06-03 NOTE — Progress Notes (Signed)
Pt receives out-pt HD at [facility] [place] on TTS. Will assist as needed.   Olivia Canter Renal Navigator [phone]

## 2023-06-03 NOTE — Plan of Care (Signed)
  Problem: Education: Goal: Knowledge of General Education information will improve Description: Including pain rating scale, medication(s)/side effects and non-pharmacologic comfort measures Outcome: Completed/Met

## 2023-06-03 NOTE — Progress Notes (Signed)
Latest Reference Range & Units 06/03/23 03:39  Hemoglobin 13.0 - 17.0 g/dL 6.8 (LL)  (LL): Data is critically low MD on call made aware. No new order.

## 2023-06-04 ENCOUNTER — Other Ambulatory Visit (HOSPITAL_COMMUNITY): Payer: Self-pay

## 2023-06-04 DIAGNOSIS — I953 Hypotension of hemodialysis: Secondary | ICD-10-CM

## 2023-06-04 LAB — BPAM RBC
Blood Product Expiration Date: 202412232359
Blood Product Expiration Date: 202412272359
ISSUE DATE / TIME: 202412141625
ISSUE DATE / TIME: 202412150020
Unit Type and Rh: 202412232359
Unit Type and Rh: 6200
Unit Type and Rh: 6200

## 2023-06-04 LAB — OSMOLALITY: Osmolality: 301 mosm/kg — ABNORMAL HIGH (ref 275–295)

## 2023-06-04 LAB — CBC
HCT: 16.9 % — ABNORMAL LOW (ref 39.0–52.0)
Hemoglobin: 6.3 g/dL — CL (ref 13.0–17.0)
MCH: 38 pg — ABNORMAL HIGH (ref 26.0–34.0)
MCHC: 37.3 g/dL — ABNORMAL HIGH (ref 30.0–36.0)
MCV: 101.8 fL — ABNORMAL HIGH (ref 80.0–100.0)
Platelets: 87 10*3/uL — ABNORMAL LOW (ref 150–400)
RBC: 1.66 MIL/uL — ABNORMAL LOW (ref 4.22–5.81)
RDW: 20.7 % — ABNORMAL HIGH (ref 11.5–15.5)
WBC: 6.5 10*3/uL (ref 4.0–10.5)
nRBC: 4.8 % — ABNORMAL HIGH (ref 0.0–0.2)

## 2023-06-04 LAB — TYPE AND SCREEN
ABO/RH(D): AB POS
Antibody Screen: NEGATIVE
Unit division: 0
Unit division: 0
Unit division: 0

## 2023-06-04 LAB — RENAL FUNCTION PANEL
Albumin: 2.8 g/dL — ABNORMAL LOW (ref 3.5–5.0)
Anion gap: 17 — ABNORMAL HIGH (ref 5–15)
BUN: 63 mg/dL — ABNORMAL HIGH (ref 8–23)
CO2: 23 mmol/L (ref 22–32)
Calcium: 8.9 mg/dL (ref 8.9–10.3)
Chloride: 91 mmol/L — ABNORMAL LOW (ref 98–111)
Creatinine, Ser: 10.07 mg/dL — ABNORMAL HIGH (ref 0.61–1.24)
GFR, Estimated: 5 mL/min — ABNORMAL LOW (ref >60)
Glucose, Bld: 100 mg/dL — ABNORMAL HIGH (ref 70–99)
Phosphorus: 10.4 mg/dL — ABNORMAL HIGH (ref 2.5–4.6)
Potassium: 4.3 mmol/L (ref 3.5–5.1)
Sodium: 131 mmol/L — ABNORMAL LOW (ref 135–145)

## 2023-06-04 LAB — GLUCOSE, CAPILLARY: Glucose-Capillary: 83 mg/dL (ref 70–99)

## 2023-06-04 LAB — HEPATITIS B SURFACE ANTIBODY, QUANTITATIVE: Hep B S AB Quant (Post): 427 m[IU]/mL

## 2023-06-04 MED ORDER — HEPARIN SODIUM (PORCINE) 1000 UNIT/ML DIALYSIS: 1000.0000 [IU] | INTRAMUSCULAR | Status: DC | PRN

## 2023-06-04 MED ORDER — MIDODRINE HCL 10 MG PO TABS
10.0000 mg | ORAL_TABLET | Freq: Three times a day (TID) | ORAL | 0 refills | Status: DC
  Filled 2023-06-04: qty 90, 30d supply, fill #0

## 2023-06-04 MED ORDER — ALLOPURINOL 100 MG PO TABS
100.0000 mg | ORAL_TABLET | ORAL | 0 refills | Status: DC
  Filled 2023-06-04: qty 12, 28d supply, fill #0

## 2023-06-04 MED ORDER — MIDODRINE HCL 5 MG PO TABS: 5.0000 mg | ORAL_TABLET | Freq: Three times a day (TID) | ORAL | Status: DC

## 2023-06-04 MED ORDER — PANTOPRAZOLE SODIUM 40 MG PO TBEC
40.0000 mg | DELAYED_RELEASE_TABLET | Freq: Every day | ORAL | 0 refills | Status: DC
Start: 2023-06-04 — End: 2023-07-23
  Filled 2023-06-04: qty 30, 30d supply, fill #0

## 2023-06-04 MED ORDER — ANTICOAGULANT SODIUM CITRATE 4% (200MG/5ML) IV SOLN: 5.0000 mL | Status: DC | PRN

## 2023-06-04 MED ORDER — LIDOCAINE-PRILOCAINE 2.5-2.5 % EX CREA: 1.0000 | TOPICAL_CREAM | CUTANEOUS | Status: DC | PRN

## 2023-06-04 MED ORDER — LIDOCAINE HCL (PF) 1 % IJ SOLN: 5.0000 mL | INTRAMUSCULAR | Status: DC | PRN

## 2023-06-04 MED ORDER — MIDODRINE HCL 5 MG PO TABS
10.0000 mg | ORAL_TABLET | Freq: Three times a day (TID) | ORAL | Status: DC
Start: 2023-06-04 — End: 2023-06-04
  Administered 2023-06-04: 10 mg via ORAL
  Filled 2023-06-04: qty 2

## 2023-06-04 MED ORDER — MIDODRINE HCL 5 MG PO TABS: 10.0000 mg | ORAL_TABLET | Freq: Three times a day (TID) | ORAL | Status: DC

## 2023-06-04 MED ORDER — PENTAFLUOROPROP-TETRAFLUOROETH EX AERO: 1.0000 | INHALATION_SPRAY | CUTANEOUS | Status: DC | PRN

## 2023-06-04 MED ORDER — ALTEPLASE 2 MG IJ SOLR: 2.0000 mg | Freq: Once | INTRAMUSCULAR | Status: DC | PRN

## 2023-06-04 NOTE — Progress Notes (Signed)
DISCHARGE NOTE HOME Patrick [name] to be discharged Home per MD order. Discussed prescriptions and follow up appointments with the patient. Prescriptions given to patient; medication list explained in detail. Patient verbalized understanding.  Skin clean, dry and intact without evidence of skin break down, no evidence of skin tears noted. IV catheter discontinued intact. Site without signs and symptoms of complications. Dressing and pressure applied. Pt denies pain at the site currently. No complaints noted.  Patient free of lines, drains, and wounds.   An After Visit Summary (AVS) was printed and given to the patient. Patient escorted via wheelchair, and discharged home via private auto.  Velia Meyer, RN

## 2023-06-04 NOTE — Evaluation (Signed)
Physical Therapy Evaluation Patient Details Name: Patrick [name] MRN: [id number] DOB: 1956-04-03 Today's Date: 06/04/2023  History of Present Illness  Patrick [name] is a 67 yo patient who presented with hypotension and shortness of breath from his hemodialysis session and was admitted to the Internal Medicine Teaching Service on 12/14 for Hypotension, low Hgb; with a history of sickle cell disease, chronic pain, CKD 5 on HD Tu/Th/Sat, legally blind, history of GI bleed with angiodysplasia on EGD (07/2021), systolic CHF (LVEF 35-40%, 05/2022), permanent Afib not on anticoagulation 2/2 history of GI bleed, mitral valve mass (2017), decompensated liver cirrhosis with ascites, GERD, and gout  Clinical Impression   Pt admitted with above diagnosis. Lives at home alone, in an apartment with 16 steps steps to enter; Prior to admission, pt was able to manage independently, walking without an assistive device, cousin helps with shopping; uses medical [facility] transport for HD; Presents to PT with R knee pain, limiting activity tolerance; Obtained a modified set of orthostatic vitals, which were not remarkable for BP drop;  Moves at overall supervision level, mostly for visual cueing in an unfamiliar environment; Pt currently with functional limitations due to the deficits listed below (see PT Problem List). Pt will benefit from skilled PT to increase their independence and safety with mobility to allow discharge to the venue listed below.       Rec return to Ortho for R knee pain, and re-starting Outpt PT       If plan is discharge home, recommend the following:     Can travel by private vehicle        Equipment Recommendations Other (comment) (will consider single point cane to help offload R knee as a part of pain management pain management)  Recommendations for Other Services       Functional Status Assessment       Precautions / Restrictions Precautions Precautions: Fall Precaution  Comments: Monitor for activity tolerance; visually impaired Restrictions Weight Bearing Restrictions Per Provider Order: No      Mobility  Bed Mobility Overal bed mobility: Independent                  Transfers Overall transfer level: Needs assistance Equipment used: None Transfers: Sit to/from Stand Sit to Stand: Supervision           General transfer comment: Supervision for safety; Cues to self-monitor for activity tolerance    Ambulation/Gait Ambulation/Gait assistance: Supervision Gait Distance (Feet): 80 Feet Assistive device:  (pushed vitals machine) Gait Pattern/deviations: Step-through pattern, Decreased step length - left, Decreased stance time - right       General Gait Details: Gait assymetry present, but very mild; R knee pain increased to 6/10 post amb bout  Stairs         General stair comments: We discussed managing stairs with knee pain, including step sequence, and taking them step by step as needed  Wheelchair Mobility     Tilt Bed    Modified Rankin (Stroke Patients Only)       Balance Overall balance assessment: Mild deficits observed, not formally tested                                           Pertinent Vitals/Pain Pain Assessment Pain Assessment: 0-10 Pain Score: 6  Pain Location: R knee pain with walking/weight bearing Pain Descriptors / Indicators: Aching Pain  Intervention(s): Monitored during session, Limited activity within patient's tolerance    Home Living Family/patient expects to be discharged to:: Private residence Living Arrangements: Alone Available Help at Discharge: Family;Available PRN/intermittently Type of Home: Apartment Home Access: Stairs to enter Entrance Stairs-Rails: Right;Left Entrance Stairs-Number of Steps: 16   Home Layout: One level Home Equipment: Agricultural consultant (2 wheels);Cane - single point      Prior Function Prior Level of Function : Independent/Modified  Independent;History of Falls (last six months);Working/employed             Mobility Comments: walks without assistive device; R knee pain and swelling have incr in the previous month before this admission; uses [facility]medical van transportation to/from work, appointments, HD ADLs Comments: Independent with BADLs; cousin helps with groceries     Extremity/Trunk Assessment   Upper Extremity Assessment Upper Extremity Assessment: Defer to OT evaluation    Lower Extremity Assessment Lower Extremity Assessment: RLE deficits/detail RLE Deficits / Details: R knee pain has been present for a few weeks; notable edema; ROM is WFL, though not pain free; Mild gait assymetry with minimally decr R knee stance compared  to L stance; is followed by an Orthopedist, and goes to Outpt PT for R knee    Cervical / Trunk Assessment Cervical / Trunk Assessment: Normal  Communication   Communication Communication: No apparent difficulties  Cognition Arousal: Alert Behavior During Therapy: WFL for tasks assessed/performed Overall Cognitive Status: Within Functional Limits for tasks assessed                                          General Comments General comments (skin integrity, edema, etc.):   06/04/23 0903 06/04/23 0904  Vital Signs  Patient Position (if appropriate) Orthostatic Vitals  --   Orthostatic Lying   BP- Lying  (EOB eating breakfast)  --   Orthostatic Sitting  BP- Sitting 119/67  --   Pulse- Sitting 69  --   Orthostatic Standing at 0 minutes  BP- Standing at 0 minutes 118/77  --   Pulse- Standing at 0 minutes 112  --   Orthostatic Standing at 3 minutes  BP- Standing at 3 minutes 112/75 116/70  Pulse- Standing at 3 minutes 88 108 (Standing, post short walk in the hallway; R knee pain 6/10)       Exercises     Assessment/Plan    PT Assessment    PT Problem List         PT Treatment Interventions      PT Goals (Current goals can be found in the  Care Plan section)  Acute Rehab PT Goals Patient Stated Goal: Less pain R knee PT Goal Formulation: With patient Time For Goal Achievement: 06/18/23 Potential to Achieve Goals: Good    Frequency Min 1X/week     Co-evaluation               AM-PAC PT "6 Clicks" Mobility  Outcome Measure Help needed turning from your back to your side while in a flat bed without using bedrails?: None Help needed moving from lying on your back to sitting on the side of a flat bed without using bedrails?: None Help needed moving to and from a bed to a chair (including a wheelchair)?: None Help needed standing up from a chair using your arms (e.g., wheelchair or bedside chair)?: None Help needed to walk in hospital room?: A  Little Help needed climbing 3-5 steps with a railing? : A Little 6 Click Score: 22    End of Session Equipment Utilized During Treatment: Other (comment) (vitals machine) Activity Tolerance: Patient tolerated treatment well;Other (comment) (Despite incr pain) Patient left: in bed;with call bell/phone within reach (sitting EOB) Nurse Communication: Mobility status PT Visit Diagnosis: Pain;Difficulty in walking, not elsewhere classified (R26.2) Pain - Right/Left: Right Pain - part of body: Knee    Time: 8469-6295 PT Time Calculation (min) (ACUTE ONLY): 17 min   Charges:   PT Evaluation $PT Eval Low Complexity: 1 Low   PT General Charges $$ ACUTE PT VISIT: 1 Visit         Van Clines, PT  Acute Rehabilitation Services Office [phone] Secure Chat welcomed   Levi Aland 06/04/2023, 10:50 AM

## 2023-06-04 NOTE — TOC Transition Note (Signed)
Transition of Care [facility]) - Discharge Note   Patient Details  Name: Patrick [name] MRN: [id number] Date of Birth: [birth year]  Transition of Care [facility]) CM/SW Contact:  Tom-Johnson, Hershal Coria, RN Phone Number: 06/04/2023, 4:02 PM   Clinical Narrative:     Patient is scheduled for discharge today.  Readmission Risk Assessment done. Outpatient referral, hospital f/u and discharge instructions on AVS. Prescriptions sent to [facility] pharmacy and patient will receive meds prior discharge. No TOC needs or recommendations noted. Cousin, Romari to transport at discharge.  No further TOC needs noted.          Final next level of care: OP Rehab Barriers to Discharge: Barriers Resolved   Patient Goals and CMS Choice Patient states their goals for this hospitalization and ongoing recovery are:: To return home CMS Medicare.gov Compare Post Acute Care list provided to:: Patient Choice offered to / list presented to : NA      Discharge Placement                Patient to be transferred to facility by: Cousin Name of family member notified: Shariq    Discharge Plan and Services Additional resources added to the After Visit Summary for                  DME Arranged: N/A DME Agency: NA       HH Arranged: NA HH Agency: NA        Social Drivers of Health (SDOH) Interventions SDOH Screenings   Food Insecurity: No Food Insecurity (06/01/2023)  Housing: Low Risk  (06/01/2023)  Transportation Needs: No Transportation Needs (06/01/2023)  Utilities: Not At Risk (06/01/2023)  Alcohol Screen: Low Risk  (09/27/2022)  Depression (PHQ2-9): Low Risk  (10/30/2022)  Recent Concern: Depression (PHQ2-9) - High Risk (10/02/2022)  Financial Resource Strain: Low Risk  (09/27/2022)  Physical Activity: Inactive (09/27/2022)  Social Connections: Socially Isolated (09/27/2022)  Stress: No Stress Concern Present (09/27/2022)  Tobacco Use: Medium Risk (06/01/2023)      Readmission Risk Interventions    06/03/2023    4:42 PM 11/27/2022    3:44 PM 10/23/2022   12:41 PM  Readmission Risk Prevention Plan  Transportation Screening Complete Complete Complete  PCP or Specialist Appt within 3-5 Days Complete    HRI or Home Care Consult Complete    Social Work Consult for Recovery Care Planning/Counseling Complete    Palliative Care Screening Not Applicable    Medication Review Oceanographer) Referral to Pharmacy Referral to Pharmacy Complete  PCP or Specialist appointment within 3-5 days of discharge  Complete Complete  HRI or Home Care Consult  Complete Complete  SW Recovery Care/Counseling Consult  Complete Complete  Palliative Care Screening  Not Applicable Not Applicable  Skilled Nursing Facility  Not Applicable Not Applicable

## 2023-06-04 NOTE — Progress Notes (Signed)
Contacted by attending that pt will d/c today. Contacted [facility] to be advised that pt will d/c today and should resume care on Thursday.   Olivia Canter Renal Navigator [phone]

## 2023-06-04 NOTE — Progress Notes (Cosign Needed Addendum)
[facility] Progress Note   Subjective:  Seen on HD - 2L UFG and tolerating, BP remains low - will increase midodrine to 10mg  TID. Denies CP/dyspnea. Hgb 6.3 - further down. Per tele, RR only 7-8 range, but seems at least 10-12 range when I watched him.   Objective Vitals:   06/04/23 0946 06/04/23 1000 06/04/23 1030 06/04/23 1100  BP: (!) 108/56 100/61 109/60 (!) 100/52  Pulse: 75 65 69 72  Resp: (!) 9 (!) 8 (!) 8 (!) 7  Temp:      TempSrc:      SpO2: 100% 100% 100% 100%  Weight:      Height:       Physical Exam General: Well appearing man, NAD. Room air. Blind. Heart: RRR; no murmur Lungs: CTA anteriorly Abdomen: soft Extremities: no LE edema Dialysis Access:  RUE AVF  Additional Objective Labs: Basic Metabolic Panel: Recent Labs  Lab 06/02/23 0633 06/02/23 0649 06/03/23 0339 06/04/23 0340  NA 133*  --  129* 131*  K 4.3  --  4.2 4.3  CL 95*  --  92* 91*  CO2 22  --  22 23  GLUCOSE 86  --  93 100*  BUN 36*  --  46* 63*  CREATININE 6.20*  --  7.60* 10.07*  CALCIUM 8.9  --  8.6* 8.9  PHOS  --  8.7*  --  10.4*   Liver Function Tests: Recent Labs  Lab 06/01/23 1126 06/04/23 0340  AST 47*  --   ALT 20  --   ALKPHOS 165*  --   BILITOT 1.8*  --   PROT 7.2  --   ALBUMIN 3.2* 2.8*   CBC: Recent Labs  Lab 06/01/23 1126 06/01/23 2036 06/02/23 0633 06/02/23 1347 06/03/23 0339 06/04/23 0340  WBC 7.6  --  5.8 6.7 5.7 6.5  NEUTROABS 5.1  --   --   --   --   --   HGB 5.9*   < > 7.2* 7.4* 6.8* 6.3*  HCT 15.8*   < > 19.3* RESULTS UNAVAILABLE DUE TO INTERFERING SUBSTANCE 18.3* 16.9*  MCV 111.3*  --  102.1* RESULTS UNAVAILABLE DUE TO INTERFERING SUBSTANCE 102.2* 101.8*  PLT 114*  --  110* 112* 100* 87*   < > = values in this interval not displayed.   Studies/Results: ECHOCARDIOGRAM COMPLETE Result Date: 06/03/2023    ECHOCARDIOGRAM REPORT   Patient Name:   Patrick [name] Date of Exam: 06/03/2023 Medical Rec #:  [id number]           Height:        71.0 in Accession #:    [id number]          Weight:       144.0 lb Date of Birth:  May 10, 1956           BSA:          1.834 m Patient Age:    67 years            BP:           96/53 mmHg Patient Gender: M                   HR:           63 bpm. Exam Location:  Inpatient Procedure: 2D Echo, Cardiac Doppler and Color Doppler Indications:    hypotension  History:        Patient has prior history of Echocardiogram examinations, most  recent 06/08/2022. CHF, Abnormal ECG; Arrythmias:Atrial                 Fibrillation. ESRD.  Sonographer:    Webb Laws Referring Phys: [id number] GRACE LAU IMPRESSIONS  1. Left ventricular ejection fraction, by estimation, is 40 to 45%. The left ventricle has mildly decreased function. The left ventricle demonstrates global hypokinesis. The left ventricular internal cavity size was mildly to moderately dilated. There is moderate concentric left ventricular hypertrophy. Left ventricular diastolic function could not be evaluated. The average left ventricular global longitudinal strain is -8.5 %. The global longitudinal strain is abnormal.  2. Right ventricular systolic function is mildly reduced. The right ventricular size is normal.  3. Left atrial size was severely dilated.  4. Right atrial size was severely dilated.  5. The mitral valve is normal in structure. Severe mitral valve regurgitation. No evidence of mitral stenosis.  6. Tricuspid valve regurgitation is severe.  7. The aortic valve is tricuspid. There is mild calcification of the aortic valve. Aortic valve regurgitation is not visualized. Aortic valve sclerosis/calcification is present, without any evidence of aortic stenosis.  8. Aortic dilatation noted. There is borderline dilatation of the aortic root, measuring 38 mm.  9. The inferior vena cava is dilated in size with <50% respiratory variability, suggesting right atrial pressure of 15 mmHg. FINDINGS  Left Ventricle: Left ventricular ejection fraction,  by estimation, is 40 to 45%. The left ventricle has mildly decreased function. The left ventricle demonstrates global hypokinesis. The average left ventricular global longitudinal strain is -8.5 %. The  global longitudinal strain is abnormal. The left ventricular internal cavity size was mildly to moderately dilated. There is moderate concentric left ventricular hypertrophy. Left ventricular diastolic function could not be evaluated due to atrial fibrillation. Left ventricular diastolic function could not be evaluated. Right Ventricle: The right ventricular size is normal. No increase in right ventricular wall thickness. Right ventricular systolic function is mildly reduced. Left Atrium: Left atrial size was severely dilated. Right Atrium: Right atrial size was severely dilated. Pericardium: Trivial pericardial effusion is present. The pericardial effusion is localized near the right atrium. Mitral Valve: The mitral valve is normal in structure. Severe mitral valve regurgitation. No evidence of mitral valve stenosis. Tricuspid Valve: The tricuspid valve is normal in structure. Tricuspid valve regurgitation is severe. No evidence of tricuspid stenosis. Aortic Valve: The aortic valve is tricuspid. There is mild calcification of the aortic valve. Aortic valve regurgitation is not visualized. Aortic valve sclerosis/calcification is present, without any evidence of aortic stenosis. Pulmonic Valve: The pulmonic valve was normal in structure. Pulmonic valve regurgitation is mild. No evidence of pulmonic stenosis. Aorta: Aortic dilatation noted. There is borderline dilatation of the aortic root, measuring 38 mm. Venous: The inferior vena cava is dilated in size with less than 50% respiratory variability, suggesting right atrial pressure of 15 mmHg. IAS/Shunts: No atrial level shunt detected by color flow Doppler. Additional Comments: A device lead is visualized.  LEFT VENTRICLE PLAX 2D LVIDd:         6.10 cm      Diastology  LVIDs:         4.30 cm      LV e' medial:    6.53 cm/s LV PW:         1.40 cm      LV E/e' medial:  20.2 LV IVS:        1.30 cm      LV e' lateral:   14.10 cm/s  LVOT diam:     2.10 cm      LV E/e' lateral: 9.4 LV SV:         64 LV SV Index:   35           2D Longitudinal Strain LVOT Area:     3.46 cm     2D Strain GLS Avg:     -8.5 %  LV Volumes (MOD) LV vol d, MOD A2C: 125.0 ml 3D Volume EF: LV vol d, MOD A4C: 129.0 ml 3D EF:        61 % LV vol s, MOD A2C: 56.2 ml  LV EDV:       151 ml LV vol s, MOD A4C: 51.5 ml  LV ESV:       59 ml LV SV MOD A2C:     68.8 ml  LV SV:        91 ml LV SV MOD A4C:     129.0 ml LV SV MOD BP:      74.5 ml RIGHT VENTRICLE RV Basal diam:  4.90 cm RV Mid diam:    3.00 cm RV S prime:     10.00 cm/s TAPSE (M-mode): 1.8 cm LEFT ATRIUM              Index        RIGHT ATRIUM           Index LA diam:        6.00 cm  3.27 cm/m   RA Area:     24.70 cm LA Vol (A2C):   172.0 ml 93.79 ml/m  RA Volume:   78.90 ml  43.03 ml/m LA Vol (A4C):   183.0 ml 99.79 ml/m LA Biplane Vol: 181.0 ml 98.70 ml/m  AORTIC VALVE LVOT Vmax:   111.00 cm/s LVOT Vmean:  73.700 cm/s LVOT VTI:    0.186 m  AORTA Ao Root diam: 3.70 cm Ao Asc diam:  3.80 cm MITRAL VALVE                  TRICUSPID VALVE MV Area (PHT): 5.66 cm       TR Peak grad:   53.9 mmHg MV Decel Time: 134 msec       TR Vmax:        367.00 cm/s MR Peak grad:    83.9 mmHg MR Mean grad:    54.0 mmHg    SHUNTS MR Vmax:         458.00 cm/s  Systemic VTI:  0.19 m MR Vmean:        350.0 cm/s   Systemic Diam: 2.10 cm MR PISA:         1.57 cm MR PISA Eff ROA: 12 mm MR PISA Radius:  0.50 cm MV E velocity: 132.00 cm/s Arvilla Meres MD Electronically signed by Arvilla Meres MD Signature Date/Time: 06/03/2023/9:31:06 AM    Final    Medications:  anticoagulant sodium citrate      allopurinol  100 mg Oral Q T,Th,Sa-HD   cinacalcet  30 mg Oral Q T,Th,Sat-1800   doxercalciferol  9 mcg Intravenous Q T,Th,Sa-HD   ferric citrate  420 mg Oral TID WC    folic acid  1 mg Oral Daily   hydroxyurea  500 mg Oral Daily   midodrine  5 mg Oral TID WC   oxyCODONE  10 mg Oral Q12H   pantoprazole (PROTONIX) IV  40 mg Intravenous Q12H    Dialysis Orders: SW TTS  4h  400/500  63.4kg  2/2 bath  RUE AVF  Heparin none - last HD 12/14, post wt 67kg - coming off 1-2.5 kg over recently - sensipar 30, hectorol 9 - mircera 225 q 2, last 12/3, due 12/17   Assessment/Plan: Symptomatic anemia: Hgb 5.9 on admit, Hx sickle cell dz, on hydroxyurea. S/p 2U PRBCs. On max ESA as outpatient - continue Aranesp weekly during admit. Hgb 6.3 today - per notes, only transfuse if < 6 or if symptomatic. Hypotension/volume: Midodrine 5mg  BID started this admit - increase to 10mg  TID. Echo showing HFrEF. EDW seems ok for now. ESRD: Continue HD on usual TTS schedule - HD now, 2L UFG. Secondary HPTH: CorrCa ok, Phos high - resumed binders/VDRA/sensipar.  Sickle cell disease Systolic CHF - EF 35-40%.  Atrial fibrillation: Not on AC.  Ozzie Hoyle, PA-C 06/04/2023, 11:11 AM  [employer]

## 2023-06-04 NOTE — Progress Notes (Signed)
Heart Failure Navigator Progress Note  Assessed for Heart & Vascular TOC clinic readiness.  Patient does not meet criteria due to ESRD on hemodialysis.   Navigator will sign off at this time.    Dawn Fields, BSN, RN Heart Failure Nurse Navigator Secure Chat Only   

## 2023-06-04 NOTE — Progress Notes (Signed)
OT Cancellation Note  Patient Details Name: Patrick [name] MRN: [id number] DOB: [birth year]   Cancelled Treatment:    Reason Eval/Treat Not Completed: (P) Patient at procedure or test/ unavailable, Pt at HD, will follow up as able  Alexis Goodell 06/04/2023, 12:48 PM

## 2023-06-04 NOTE — Discharge Summary (Signed)
Name: Patrick [name] MRN: [id number] DOB: 25-Aug-1955 67 y.o. PCP: Patrick Angst, MD  Date of Admission: 06/01/2023 10:04 AM Date of Discharge: 06/04/2023 2:11 PM Attending Physician: Patrick [name]  Discharge Diagnosis: Principal Problem:   Hypotension Active Problems:   ESRD (end stage renal disease) (HCC)   Symptomatic anemia   Cirrhosis (HCC)    Discharge Medications: Allergies as of 06/04/2023   No Known Allergies      Medication List     PAUSE taking these medications    torsemide 20 MG tablet Wait to take this until your doctor or other care provider tells you to start again. Commonly known as: DEMADEX TAKE 2 TABLETS BY MOUTH IN THE MORNING AND AT BEDTIME       TAKE these medications    acetaminophen 500 MG tablet Commonly known as: TYLENOL Take 1,500 mg by mouth 3 (three) times daily as needed for mild pain, moderate pain, fever or headache.   allopurinol 100 MG tablet Commonly known as: ZYLOPRIM Take 1 tablet (100 mg total) by mouth Every Tuesday,Thursday,and Saturday with dialysis. What changed: when to take this   Auryxia 1 GM 210 MG(Fe) tablet Generic drug: ferric citrate Take 210-420 mg by mouth See admin instructions. 420 mg with every meal, 210 mg with each snack.   cinacalcet 30 MG tablet Commonly known as: SENSIPAR Take 1 tablet (30 mg total) by mouth Every Tuesday,Thursday,and Saturday with dialysis.   Constulose 10 GM/15ML solution Generic drug: lactulose Take 30 mLs (20 g total) by mouth 2 (two) times daily as needed for mild constipation.   diphenhydramine-acetaminophen 25-500 MG Tabs tablet Commonly known as: TYLENOL PM Take 2 tablets by mouth at bedtime as needed (sleep/pain).   folic acid 1 MG tablet Commonly known as: FOLVITE TAKE 1 TABLET BY MOUTH EVERY DAY   hydroxyurea 500 MG capsule Commonly known as: HYDREA Take 1 capsule (500 mg total) by mouth daily.   lidocaine-prilocaine cream Commonly known as:  EMLA Apply 1 application. topically Every Tuesday,Thursday,and Saturday with dialysis.   midodrine 10 MG tablet Commonly known as: PROAMATINE Take 1 tablet (10 mg total) by mouth 3 (three) times daily with meals.   ofloxacin 0.3 % ophthalmic solution Commonly known as: OCUFLOX Place 1 drop into both eyes 2 (two) times daily.   oxyCODONE 10 mg 12 hr tablet Commonly known as: OXYCONTIN Take 1 tablet (10 mg total) by mouth every 12 (twelve) hours.   Oxycodone HCl 10 MG Tabs Take 1 tablet (10 mg total) by mouth every 6 (six) hours as needed.   pantoprazole 40 MG tablet Commonly known as: PROTONIX Take 1 tablet (40 mg total) by mouth daily. Use this prescription after done with twice daily dosing x 2 months What changed: Another medication with the same name was removed. Continue taking this medication, and follow the directions you see here.   prednisoLONE acetate 1 % ophthalmic suspension Commonly known as: PRED FORTE Place 1 drop into both eyes 2 (two) times daily.   Sarna lotion Generic drug: camphor-menthol Apply 1 Application topically as needed for itching.   timolol 0.5 % ophthalmic solution Commonly known as: TIMOPTIC Place 1 drop into both eyes 2 (two) times daily.   tiZANidine 4 MG tablet Commonly known as: ZANAFLEX TAKE 1/2 TABLET (2MG  TOTAL) BY MOUTH EVERY 8 HOURS AS NEEDED FOR MUSCLE SPASM        Disposition and follow-up:   Patrick [name] was discharged from [facility] in Good  condition.  At the hospital follow up visit please address:  1.  Follow-up:  *Hypotension  -Please ensure patient is taking midodrine 10 mg TID -Assess patient for hypotensive symptoms  *Sickle Cell Anemia  -Please ensure patient follows up with hematologist in 2-4 weeks -Repeat CBC -Assess for other sources of bleeding (GI etc.)  *ESRD -Ensure patient attends HD sessions on T,TH,S -Please note that his allopurinol has been changed to T,Th,S AFTER HD  sessions  *HFrEF -Please note that we instructed patient to hold torsemide for hypotension -Assess Volume status    2.  Labs / imaging needed at time of follow-up: CBC  3.  Pending labs/ test needing follow-up: N/A  4.  Medication Changes  STOPPED  -Torsemide     ADDED  -Midodrine 10 mg TID   MODIFIED  -Allopurinol 100 mg every T,  TH, S after HD sessions    Follow-up Appointments: PCP in 7-10 days Hematologist in 2-4 weeks   Hospital Course by problem list: Symptomatic Hypotension  History of low BP. Blood pressures remain on the [name] side today between 80s-90s systolic and 50s-60s diastolic. Per chart review, hypotension appears to be chronic. He was started on midodrine 5mg  which was up titrated to 10mg  TID. Patient was able to tolerate his HD session without hypotension or symptoms of hypotension. BP at discharge was 105/51.    Decompensated liver cirrhosis History of ascites  Patient does not present with signs or symptoms concerning for variceal bleed. Denied overt sx of bleeding however; this may be difficult to ascertain due to patient's vision impairment. Patient declined rectal exam. Ceftriaxone and IV PPI was discontinued.    Symptomatic Anemia  Sickle Cell Disease  Baseline Hgb 8-9, Hgb this morning is at 6.3. Patient's transfusion threshold has been Hgb <6 in the past. Follows with Dr. Hyman Hopes, MD and Patrick Handler, FNP regularly. Resumed home folic acid and pain regimen. He was continued on his home regimen of Folic acid 1 mg daily, Hydroxyurea 500 mg daily, Tylenol 650 mg q6H PRN, Scheduled, Oxycodone/Oxycontin 10 mg q12H, Oxycodone HCl 10 mg q6H PRN. Patient received 2 units of pRBCs during this hospitalization. He will require hematology follow up.   ESRD CKD stage 5 Patient goes to HD Tuesdays, Thursdays, and Saturdays. Last HD session cut short on day of admission 12/14. Consulted with nephrology for HD while patient is here. Patient received in patient HD  on Tuesday and tolerated the session. He will start outpatient HD on Thursday.    HFrEF (LVEF 40-45%) 06/03/23 Patient has been seen by Patrick [name] in the past, but not for some time. GDMT limited by CKD, hypotension. Volume mostly managed by HD, takes torsemide 20 mg BID at home. Home torsemide was held during hospitalization and patient was instructed to hold torsemide until he follows up with his PCP. Echocardiogram showed an LVEF improved from 35-40% 05/2022 to 40-45%.    Permanent Afib not on anticoagulation  Patient with DCCV with successful conversion to NSR April 2021, back in atrial flutter May 2021. Referred to Afib clinic, per previous cardiology consult note not a good candidate for amiodarone, was on Eliquis at some point but this was discontinued due to upper GI bleed January 2023. Not on AV nodal blocking medications or anticoagulation at home. Patient remained rate controlled while hospitalized.    Gout Chronic. Begin renal dosing while on HD, 100 mg after HD sessions.    Discharge Subjective: Patient reported feeling well and stated that he is  ready to go home. He denies feeling dizzy or light headed today or during his HD session. He has a ride home and would like a work note. We went over his instructions for discharge and medication changes.   Discharge Exam:   Blood pressure (!) 104/53, pulse 81, temperature 97.6 F (36.4 C), temperature source Oral, resp. rate (!) 22, height 5\' 11"  (1.803 m), weight 71.6 kg, SpO2 100%.  Constitutional:Well appearing, laying in bed finishing his HD session, in no acute distress HENT: normocephalic atraumatic, mucous membranes moist Cardiovascular: irregularly rhythm and regular rate, no m/r/g Pulmonary/Chest: normal work of breathing on room air, lungs clear to auscultation bilaterally. No crackles  Abdominal: soft, non-tender, non-distended.  Neurological: alert & oriented x 3 MSK: no gross abnormalities. No pitting edema Skin: warm  and dry Psych: Normal mood and affect  Pertinent Labs, Studies, and Procedures:     Latest Ref Rng & Units 06/04/2023    3:40 AM 06/03/2023    3:39 AM 06/02/2023    1:47 PM  CBC  WBC 4.0 - 10.5 K/uL 6.5  5.7  6.7   Hemoglobin 13.0 - 17.0 g/dL 6.3  6.8  7.4   Hematocrit 39.0 - 52.0 % 16.9  18.3  RESULTS UNAVAILABLE DUE TO INTERFERING SUBSTANCE   Platelets 150 - 400 K/uL 87  100  112        Latest Ref Rng & Units 06/04/2023    3:40 AM 06/03/2023    3:39 AM 06/02/2023    6:33 AM  CMP  Glucose 70 - 99 mg/dL 782  93  86   BUN 8 - 23 mg/dL 63  46  36   Creatinine 0.61 - 1.24 mg/dL 95.62  1.30  8.65   Sodium 135 - 145 mmol/L 131  129  133   Potassium 3.5 - 5.1 mmol/L 4.3  4.2  4.3   Chloride 98 - 111 mmol/L 91  92  95   CO2 22 - 32 mmol/L 23  22  22    Calcium 8.9 - 10.3 mg/dL 8.9  8.6  8.9     No results found.   Discharge Instructions: Discharge Instructions     (HEART FAILURE PATIENTS) Call MD:  Anytime you have any of the following symptoms: 1) 3 pound weight gain in 24 hours or 5 pounds in 1 week 2) shortness of breath, with or without a dry hacking cough 3) swelling in the hands, feet or stomach 4) if you have to sleep on extra pillows at night in order to breathe.   Complete by: As directed    Ambulatory referral to Physical Therapy   Complete by: As directed    Call MD for:  difficulty breathing, headache or visual disturbances   Complete by: As directed    Call MD for:  extreme fatigue   Complete by: As directed    Call MD for:  hives   Complete by: As directed    Call MD for:  persistant dizziness or light-headedness   Complete by: As directed    Call MD for:  persistant nausea and vomiting   Complete by: As directed    Call MD for:  redness, tenderness, or signs of infection (pain, swelling, redness, odor or green/yellow discharge around incision site)   Complete by: As directed    Call MD for:  severe uncontrolled pain   Complete by: As directed    Call MD  for:  temperature >100.4   Complete by: As directed  Diet - low sodium heart healthy   Complete by: As directed    Discharge instructions   Complete by: As directed    You came to the hospital for low blood pressure and you were diagnosed with hypotension.    *For your hypotension -We have started you on these following medications:  -Midodrine 10 mg  three times a day with meals  -Please do not take torsemide until instructed by your PCP   -Please see PCP/ Family Doctor in 7 to 10 days   *For your Sickle Cell Anemia -Please continue taking your medications as previously prescribed  -Please see hematologist (blood doctor) in 7 to 10 days  *For your Gout              -Please note that we have change the allopurinol regimen to 100 mg AFTER each HD session, do not take the allopurinol on days that you do not have HD  Please attend Thursdays HD session  Follow-up appointments: Please visit your family doctor in 7 to 10 days Please visit hematologist (blood doctor) in 7-10 days  If you have any questions or concerns please feel free to call: Internal medicine clinic at [phone]   If you have any of these following symptoms, please call us or seek care at an emergency department: -Chest Pain -Difficulty Breathing -Dizziness -Syncope (passing out) -Drooping of face -Slurred speech -Sudden weakness in your leg or arm -Fever -Chills   We are glad that you are feeling better, it was a pleasure to care for you!  Faith Rogue DO   Increase activity slowly   Complete by: As directed    No wound care   Complete by: As directed        Signed: Faith Rogue DO [facility] - PGY1 Pager: [phone] 06/04/2023, 2:11 PM    Please contact the on call pager after 5 pm and on weekends at [phone].

## 2023-06-04 NOTE — Progress Notes (Signed)
Received patient in bed to unit.  Alert and oriented.  Informed consent signed and in chart.   TX duration:3.5  Patient tolerated well.  Transported back to the room  Alert, without acute distress.  Hand-off given to patient's nurse.   Access used: right avf Access issues: none  Total UF removed: 2L Medication(s) given: hectorol   06/04/23 1327  Vitals  Temp 98 F (36.7 C)  Temp Source Oral  BP 99/60  MAP (mmHg) 70  BP Location Left Arm  BP Method Automatic  Patient Position (if appropriate) Lying  Pulse Rate 79  Pulse Rate Source Monitor  ECG Heart Rate 79  Resp 15  Oxygen Therapy  SpO2 99 %  O2 Device Room Air  During Treatment Monitoring  HD Safety Checks Performed Yes  Intra-Hemodialysis Comments Tx completed;Tolerated well  Dialysis Fluid Bolus Normal Saline  Bolus Amount (mL) 300 mL      [facility]

## 2023-06-04 NOTE — Discharge Instructions (Signed)
You came to the hospital for low blood pressure and you were diagnosed with hypotension.    *For your hypotension -We have started you on these following medications:  -Midodrine 10 mg  three times a day with meals  -Please do not take torsemide until instructed by your PCP   -Please see PCP/ Family Doctor in 7 to 10 days   *For your Sickle Cell Anemia -Please continue taking your medications as previously prescribed  -Please see hematologist (blood doctor) in 7 to 10 days  *For your Gout              -Please note that we have change the allopurinol regimen to 100 mg AFTER each HD session, do not take the allopurinol on days that you do not have HD  Please attend Thursdays HD session  Follow-up appointments: Please visit your family doctor in 7 to 10 days Please visit hematologist (blood doctor) in 7-10 days  If you have any questions or concerns please feel free to call: Internal medicine clinic at [phone]   If you have any of these following symptoms, please call us or seek care at an emergency department: -Chest Pain -Difficulty Breathing -Dizziness -Syncope (passing out) -Drooping of face -Slurred speech -Sudden weakness in your leg or arm -Fever -Chills   We are glad that you are feeling better, it was a pleasure to care for you!  Faith Rogue DO

## 2023-06-04 NOTE — Evaluation (Signed)
Occupational Therapy Evaluation Patient Details Name: ABHIJIT NOLTING MRN: [id number] DOB: 02/07/1956 Today's Date: 06/04/2023   History of Present Illness Jeffree D. Bleazard is a 67 yo patient who presented with hypotension and shortness of breath from his hemodialysis session and was admitted to the Internal Medicine Teaching Service on 12/14 for Hypotension, low Hgb; with a history of sickle cell disease, chronic pain, CKD 5 on HD Tu/Th/Sat, legally blind, history of GI bleed with angiodysplasia on EGD (07/2021), systolic CHF (LVEF 35-40%, 05/2022), permanent Afib not on anticoagulation 2/2 history of GI bleed, mitral valve mass (2017), decompensated liver cirrhosis with ascites, GERD, and gout   Clinical Impression   Pt in good spirits, eager to return home, feels back to baseline. Pt lives alone, family helps with grocery shopping, Pt independent with ADL/IADLs at home, 16 stairs to enter apartment. Pt currently at baseline, able to complete ADLs in room independent, ambulates independently across room to bag and dresses without assistance. Pt able to stand and bend/reach for items without LOB. Pt states he has no OT needs, no follow up needs, has all DME needed to remain safe/independent at home. Signing off.        If plan is discharge home, recommend the following: Assist for transportation    Functional Status Assessment  Patient has had a recent decline in their functional status and demonstrates the ability to make significant improvements in function in a reasonable and predictable amount of time.  Equipment Recommendations  None recommended by OT    Recommendations for Other Services       Precautions / Restrictions Precautions Precautions: Fall Precaution Comments: Monitor for activity tolerance; visually impaired Restrictions Weight Bearing Restrictions Per Provider Order: No      Mobility Bed Mobility Overal bed mobility: Independent                   Transfers Overall transfer level: Independent Equipment used: None               General transfer comment: ambulating around room upon entry to complete dressing to prepare for DC, no LOB, good safety awareness, able to bend down unsupported for minutes to reach for items,.      Balance Overall balance assessment: Independent                                         ADL either performed or assessed with clinical judgement   ADL Overall ADL's : Independent                                       General ADL Comments: at baseline, independent     Vision Baseline Vision/History: 6 Macular Degeneration Ability to See in Adequate Light: 4 Severely impaired Patient Visual Report: No change from baseline       Perception         Praxis         Pertinent Vitals/Pain Pain Assessment Pain Assessment: No/denies pain     Extremity/Trunk Assessment Upper Extremity Assessment Upper Extremity Assessment: Overall WFL for tasks assessed           Communication Communication Communication: No apparent difficulties   Cognition Arousal: Alert Behavior During Therapy: WFL for tasks assessed/performed Overall Cognitive Status: Within Functional Limits for tasks assessed  General Comments       Exercises     Shoulder Instructions      Home Living Family/patient expects to be discharged to:: Private residence Living Arrangements: Alone Available Help at Discharge: Family;Available PRN/intermittently Type of Home: Apartment Home Access: Stairs to enter Entrance Stairs-Number of Steps: 16 Entrance Stairs-Rails: Right;Left Home Layout: One level     Bathroom Shower/Tub: Chief Strategy Officer: Handicapped height     Home Equipment: Agricultural consultant (2 wheels);Cane - single point          Prior Functioning/Environment Prior Level of Function :  Independent/Modified Independent;History of Falls (last six months);Working/employed             Mobility Comments: walks without assistive device; R knee pain and swelling have incr in the previous month before this admission; uses [employer]/medical van transportation to/from work, appointments, HD ADLs Comments: Independent with BADLs; cousin helps with groceries        OT Problem List: Impaired vision/perception;Pain      OT Treatment/Interventions:      OT Goals(Current goals can be found in the care plan section) Acute Rehab OT Goals Patient Stated Goal: to return home OT Goal Formulation: With patient Time For Goal Achievement: 06/18/23 Potential to Achieve Goals: Good  OT Frequency:      Co-evaluation              AM-PAC OT "6 Clicks" Daily Activity     Outcome Measure Help from another person eating meals?: None Help from another person taking care of personal grooming?: None Help from another person toileting, which includes using toliet, bedpan, or urinal?: None Help from another person bathing (including washing, rinsing, drying)?: None Help from another person to put on and taking off regular upper body clothing?: None Help from another person to put on and taking off regular lower body clothing?: None 6 Click Score: 24   End of Session Nurse Communication: Mobility status  Activity Tolerance: Patient tolerated treatment well Patient left: in bed;with call bell/phone within reach  OT Visit Diagnosis: Muscle weakness (generalized) (M62.81);Pain Pain - Right/Left: Right Pain - part of body: Knee                Time: 5638-7564 OT Time Calculation (min): 11 min Charges:  OT General Charges $OT Visit: 1 Visit OT Evaluation $OT Eval Low Complexity: 1 Low  [address], OTR/L  Alexis Goodell 06/04/2023, 2:59 PM

## 2023-06-04 NOTE — Discharge Planning (Signed)
[facility] Patient Discharge Orders - [facility] CLINIC: [facility]  Patient's name: Patrick [name] Admit/DC Dates: 06/01/2023 - 06/04/23  DISCHARGE DIAGNOSES: Symptomatic anemia -> transfused, no signs of GIB, felt due to sickle cell dz Hypotension -> midodrine 10mg  TID started  HD ORDER CHANGES: Heparin change: no EDW Change: yes New EDW: 66kg Bath Change: no  ANEMIA MANAGEMENT: Aranesp: Given: no   ESA dose for discharge: mircera 225 mcg IV q 2 weeks, GIVE NEXT HD 12/19 IV Iron dose at discharge: per protocol Transfusion: Given: 2U PRBCs  BONE/MINERAL MEDICATIONS: Hectorol/Calcitriol change: no Sensipar/Parsabiv change: no  ACCESS INTERVENTION/CHANGE: no Details:  RECENT LABS: Recent Labs  Lab 06/04/23 0340  HGB 6.3*  NA 131*  K 4.3  CALCIUM 8.9  PHOS 10.4*  ALBUMIN 2.8*    IV ANTIBIOTICS: no Details:  OTHER ANTICOAGULATION: On Coumadin?: no  OTHER/APPTS/LAB ORDERS:  - Needs f/u with hematology. Transfusion threshold lowered to 6 during admit unless he becomes symptomatic (he was not on discharge)  D/C Meds to be reconciled by nurse after every discharge.  Completed By: Ozzie Hoyle, PA-C [facility] Pager [phone]   Reviewed by: MD:______ RN_______

## 2023-06-05 ENCOUNTER — Other Ambulatory Visit: Payer: Self-pay

## 2023-06-05 ENCOUNTER — Ambulatory Visit: Payer: PPO | Attending: Physician Assistant | Admitting: Physical Therapy

## 2023-06-05 ENCOUNTER — Telehealth (HOSPITAL_COMMUNITY): Payer: Self-pay | Admitting: Nephrology

## 2023-06-05 ENCOUNTER — Encounter: Payer: Self-pay | Admitting: Physical Therapy

## 2023-06-05 VITALS — BP 93/48 | HR 68

## 2023-06-05 DIAGNOSIS — R2681 Unsteadiness on feet: Secondary | ICD-10-CM | POA: Diagnosis not present

## 2023-06-05 DIAGNOSIS — G8929 Other chronic pain: Secondary | ICD-10-CM | POA: Diagnosis not present

## 2023-06-05 DIAGNOSIS — M6281 Muscle weakness (generalized): Secondary | ICD-10-CM | POA: Insufficient documentation

## 2023-06-05 DIAGNOSIS — M25561 Pain in right knee: Secondary | ICD-10-CM | POA: Insufficient documentation

## 2023-06-05 DIAGNOSIS — R29898 Other symptoms and signs involving the musculoskeletal system: Secondary | ICD-10-CM | POA: Insufficient documentation

## 2023-06-05 NOTE — Therapy (Signed)
OUTPATIENT PHYSICAL THERAPY LOWER EXTREMITY EVALUATION   Patient Name: Patrick [name] MRN: [id number] DOB:1956-02-09, 67 y.o., male Today's Date: 06/05/2023  END OF SESSION:  06/05/23 1546  PT Visits / Re-Eval  Visit Number 1  Number of Visits 9 (8 + eval)  Date for PT Re-Evaluation 08/09/23 (pushed out due to holiday scheduling)  Authorization  Authorization Type [employer]  PT Time Calculation  PT Start Time 1532  PT Stop Time 1614  PT Time Calculation (min) 42 min  PT - End of Session  Equipment Utilized During Treatment Gait belt  Behavior During Therapy WFL for tasks assessed/performed    Past Medical History:  Diagnosis Date   A-fib (HCC)    Blood dyscrasia    Blood transfusion    "I've had a bunch of them"   CHF (congestive heart failure) (HCC)    followed by hf clinic   Chronic kidney disease    Stage 5, dialysis on Tu/Th/Sa   Dialysis patient (HCC) 04/2019   AV fistula (right arm), TTS   Dysrhythmia    A-Fib per patient   Early satiety    Elevated LFTs 04/01/2022   Elevated troponin 04/01/2022   Empyema of gallbladder 05/27/2021   GERD (gastroesophageal reflux disease)    Gout    Legally blind    Metabolic acidosis with increased anion gap and accumulation of organic acids 04/01/2022   Mitral regurgitation    Muscle tightness-Right arm  05/27/2014   Pneumonia    Pulmonary hypertension (HCC)    Shortness of breath dyspnea    Sickle cell disease, type SS (HCC)    Swelling abdomen    Swelling of extremity, left    Swelling of extremity, right    Tricuspid regurgitation    Past Surgical History:  Procedure Laterality Date   A/V FISTULAGRAM Right 07/29/2020   Procedure: A/V FISTULAGRAM - Right Arm;  Surgeon: Chuck Hint, MD;  Location: [facility] INVASIVE CV LAB;  Service: Cardiovascular;  Laterality: Right;   BASCILIC VEIN TRANSPOSITION Right 04/12/2014   Procedure: BASILIC VEIN TRANSPOSITION;  Surgeon: Sherren Kerns, MD;   Location: [facility] OR;  Service: Vascular;  Laterality: Right;   BILIARY STENT PLACEMENT  04/04/2022   Procedure: BILIARY STENT PLACEMENT;  Surgeon: Meryl Dare, MD;  Location: [facility] ENDOSCOPY;  Service: Gastroenterology;;   BIOPSY  08/04/2020   Procedure: BIOPSY;  Surgeon: Meryl Dare, MD;  Location: [facility] ENDOSCOPY;  Service: Gastroenterology;;   BIOPSY  06/28/2021   Procedure: BIOPSY;  Surgeon: Beverley Fiedler, MD;  Location: [facility] ENDOSCOPY;  Service: Gastroenterology;;   BIOPSY  04/02/2022   Procedure: BIOPSY;  Surgeon: Lemar Lofty., MD;  Location: [facility] ENDOSCOPY;  Service: Gastroenterology;;   BIOPSY  10/22/2022   Procedure: BIOPSY;  Surgeon: Beverley Fiedler, MD;  Location: [facility] ENDOSCOPY;  Service: Gastroenterology;;   BOWEL RESECTION N/A 05/24/2021   Procedure: SMALL BOWEL RESECTION;  Surgeon: Emelia Loron, MD;  Location: [facility] OR;  Service: General;  Laterality: N/A;   CARDIOVERSION N/A 06/05/2018   Procedure: CARDIOVERSION;  Surgeon: Dolores Patty, MD;  Location: [facility] ENDOSCOPY;  Service: Cardiovascular;  Laterality: N/A;   CARDIOVERSION N/A 10/06/2019   Procedure: CARDIOVERSION;  Surgeon: Dolores Patty, MD;  Location: [facility] ENDOSCOPY;  Service: Cardiovascular;  Laterality: N/A;   CHOLECYSTECTOMY N/A 05/24/2021   Procedure: OPEN CHOLECYSTECTOMY;  Surgeon: Emelia Loron, MD;  Location: [facility] OR;  Service: General;  Laterality: N/A;   COLONOSCOPY WITH PROPOFOL N/A 09/24/2017   Procedure: COLONOSCOPY WITH  PROPOFOL;  Surgeon: Lynann Bologna, MD;  Location: [facility] ENDOSCOPY;  Service: Endoscopy;  Laterality: N/A;   ENDOSCOPIC RETROGRADE CHOLANGIOPANCREATOGRAPHY (ERCP) WITH PROPOFOL N/A 05/17/2022   Procedure: ENDOSCOPIC RETROGRADE CHOLANGIOPANCREATOGRAPHY (ERCP) WITH PROPOFOL;  Surgeon: Meridee Score Netty Starring., MD;  Location: [facility] ENDOSCOPY;  Service: Gastroenterology;  Laterality: N/A;   ERCP N/A 04/04/2022   Procedure: ENDOSCOPIC RETROGRADE CHOLANGIOPANCREATOGRAPHY (ERCP);  Surgeon:  Meryl Dare, MD;  Location: [facility] ENDOSCOPY;  Service: Gastroenterology;  Laterality: N/A;   ESOPHAGOGASTRODUODENOSCOPY N/A 09/19/2017   Procedure: ESOPHAGOGASTRODUODENOSCOPY (EGD);  Surgeon: Lynann Bologna, MD;  Location: [facility] ENDOSCOPY;  Service: Endoscopy;  Laterality: N/A;   ESOPHAGOGASTRODUODENOSCOPY N/A 05/29/2021   Procedure: ESOPHAGOGASTRODUODENOSCOPY (EGD);  Surgeon: Rachael Fee, MD;  Location: [facility] ENDOSCOPY;  Service: Gastroenterology;  Laterality: N/A;   ESOPHAGOGASTRODUODENOSCOPY (EGD) WITH PROPOFOL N/A 08/04/2020   Procedure: ESOPHAGOGASTRODUODENOSCOPY (EGD) WITH PROPOFOL;  Surgeon: Meryl Dare, MD;  Location: [facility] ENDOSCOPY;  Service: Gastroenterology;  Laterality: N/A;   ESOPHAGOGASTRODUODENOSCOPY (EGD) WITH PROPOFOL N/A 06/28/2021   Procedure: ESOPHAGOGASTRODUODENOSCOPY (EGD) WITH PROPOFOL;  Surgeon: Beverley Fiedler, MD;  Location: [facility] ENDOSCOPY;  Service: Gastroenterology;  Laterality: N/A;   ESOPHAGOGASTRODUODENOSCOPY (EGD) WITH PROPOFOL N/A 08/10/2021   Procedure: ESOPHAGOGASTRODUODENOSCOPY (EGD) WITH PROPOFOL;  Surgeon: Lynann Bologna, MD;  Location: [facility] ENDOSCOPY;  Service: Gastroenterology;  Laterality: N/A;   ESOPHAGOGASTRODUODENOSCOPY (EGD) WITH PROPOFOL N/A 08/02/2021   Procedure: ESOPHAGOGASTRODUODENOSCOPY (EGD) WITH PROPOFOL;  Surgeon: Shellia Cleverly, DO;  Location: [facility] ENDOSCOPY;  Service: Gastroenterology;  Laterality: N/A;   ESOPHAGOGASTRODUODENOSCOPY (EGD) WITH PROPOFOL N/A 04/02/2022   Procedure: ESOPHAGOGASTRODUODENOSCOPY (EGD) WITH PROPOFOL;  Surgeon: Meridee Score Netty Starring., MD;  Location: [facility] ENDOSCOPY;  Service: Gastroenterology;  Laterality: N/A;   ESOPHAGOGASTRODUODENOSCOPY (EGD) WITH PROPOFOL N/A 05/17/2022   Procedure: ESOPHAGOGASTRODUODENOSCOPY (EGD) WITH PROPOFOL;  Surgeon: Meridee Score Netty Starring., MD;  Location: [facility] ENDOSCOPY;  Service: Gastroenterology;  Laterality: N/A;   ESOPHAGOGASTRODUODENOSCOPY (EGD) WITH PROPOFOL N/A 10/22/2022   Procedure:  ESOPHAGOGASTRODUODENOSCOPY (EGD) WITH PROPOFOL;  Surgeon: Beverley Fiedler, MD;  Location: [facility] ENDOSCOPY;  Service: Gastroenterology;  Laterality: N/A;   EUS N/A 05/17/2022   Procedure: UPPER ENDOSCOPIC ULTRASOUND (EUS) RADIAL;  Surgeon: Lemar Lofty., MD;  Location: [facility] ENDOSCOPY;  Service: Gastroenterology;  Laterality: N/A;   FINE NEEDLE ASPIRATION  04/02/2022   Procedure: FINE NEEDLE ASPIRATION (FNA) LINEAR;  Surgeon: Lemar Lofty., MD;  Location: [facility] ENDOSCOPY;  Service: Gastroenterology;;   FINE NEEDLE ASPIRATION N/A 05/17/2022   Procedure: FINE NEEDLE ASPIRATION (FNA) LINEAR;  Surgeon: Lemar Lofty., MD;  Location: [facility] ENDOSCOPY;  Service: Gastroenterology;  Laterality: N/A;   HEMOSTASIS CLIP PLACEMENT  05/29/2021   Procedure: HEMOSTASIS CLIP PLACEMENT;  Surgeon: Rachael Fee, MD;  Location: [facility] ENDOSCOPY;  Service: Gastroenterology;;   HOT HEMOSTASIS N/A 05/29/2021   Procedure: HOT HEMOSTASIS (ARGON PLASMA COAGULATION/BICAP);  Surgeon: Rachael Fee, MD;  Location: [facility] ENDOSCOPY;  Service: Gastroenterology;  Laterality: N/A;   HOT HEMOSTASIS N/A 06/28/2021   Procedure: HOT HEMOSTASIS (ARGON PLASMA COAGULATION/BICAP);  Surgeon: Beverley Fiedler, MD;  Location: [facility] ENDOSCOPY;  Service: Gastroenterology;  Laterality: N/A;   HOT HEMOSTASIS N/A 08/10/2021   Procedure: HOT HEMOSTASIS (ARGON PLASMA COAGULATION/BICAP);  Surgeon: Lynann Bologna, MD;  Location: [facility] ENDOSCOPY;  Service: Gastroenterology;  Laterality: N/A;   HOT HEMOSTASIS N/A 08/02/2021   Procedure: HOT HEMOSTASIS (ARGON PLASMA COAGULATION/BICAP);  Surgeon: Shellia Cleverly, DO;  Location: [facility] ENDOSCOPY;  Service: Gastroenterology;  Laterality: N/A;   IR PARACENTESIS  07/04/2022   IR PERC TUN PERIT CATH WO PORT S&I Judi Cong  05/23/2021  IR REMOVAL TUN CV CATH W/O FL  06/10/2021   IR US GUIDE VASC ACCESS RIGHT  05/23/2021   LAPAROSCOPY N/A 05/24/2021   Procedure: LAPAROSCOPY DIAGNOSTIC;  Surgeon: Emelia Loron, MD;  Location: [facility] OR;  Service: General;  Laterality: N/A;   LAPAROTOMY N/A 05/24/2021   Procedure: EXPLORATORY LAPAROTOMY;  Surgeon: Emelia Loron, MD;  Location: [facility] OR;  Service: General;  Laterality: N/A;   LEFT AND RIGHT HEART CATHETERIZATION WITH CORONARY ANGIOGRAM N/A 06/11/2011   Procedure: LEFT AND RIGHT HEART CATHETERIZATION WITH CORONARY ANGIOGRAM;  Surgeon: Laurey Morale, MD;  Location: [facility] CATH LAB;  Service: Cardiovascular;  Laterality: N/A;   PERIPHERAL VASCULAR BALLOON ANGIOPLASTY Right 07/29/2020   Procedure: PERIPHERAL VASCULAR BALLOON ANGIOPLASTY;  Surgeon: Chuck Hint, MD;  Location: [facility] INVASIVE CV LAB;  Service: Cardiovascular;  Laterality: Right;  arm fistula   REMOVAL OF STONES  05/17/2022   Procedure: REMOVAL OF STONES;  Surgeon: Meridee Score Netty Starring., MD;  Location: [facility] ENDOSCOPY;  Service: Gastroenterology;;   REVISON OF ARTERIOVENOUS FISTULA Right 08/08/2020   Procedure: ANEURYSM EXCISION OF RIGHT UPPER EXTREMITY ARTERIOVENOUS FISTULA;  Surgeon: Chuck Hint, MD;  Location: [facility] OR;  Service: Vascular;  Laterality: Right;   REVISON OF ARTERIOVENOUS FISTULA Right 03/21/2022   Procedure: EXCISION OF ULCERATED SKIN OF RIGHT ARTERIOVENOUS FISTULA REVISION;  Surgeon: Cephus Shelling, MD;  Location: [facility] OR;  Service: Vascular;  Laterality: Right;   REVISON OF ARTERIOVENOUS FISTULA Right 07/02/2022   Procedure: EXCISION OF ULCERS RIGHT ARTERIOVENOUS FISTULA;  Surgeon: Chuck Hint, MD;  Location: [facility] OR;  Service: Vascular;  Laterality: Right;   SCLEROTHERAPY  05/29/2021   Procedure: Susa Day;  Surgeon: Rachael Fee, MD;  Location: [facility] ENDOSCOPY;  Service: Gastroenterology;;   Dennison Mascot  04/04/2022   Procedure: SPHINCTEROTOMY;  Surgeon: Meryl Dare, MD;  Location: [facility] ENDOSCOPY;  Service: Gastroenterology;;   Francine Graven REMOVAL  05/29/2021   Procedure: STENT REMOVAL;  Surgeon: Rachael Fee, MD;  Location: [facility]  ENDOSCOPY;  Service: Gastroenterology;;   Francine Graven REMOVAL  05/17/2022   Procedure: STENT REMOVAL;  Surgeon: Lemar Lofty., MD;  Location: [facility] ENDOSCOPY;  Service: Gastroenterology;;   TEE WITHOUT CARDIOVERSION N/A 12/27/2015   Procedure: TRANSESOPHAGEAL ECHOCARDIOGRAM (TEE);  Surgeon: Quintella Reichert, MD;  Location: [facility] ENDOSCOPY;  Service: Cardiovascular;  Laterality: N/A;   UPPER ESOPHAGEAL ENDOSCOPIC ULTRASOUND (EUS) N/A 04/02/2022   Procedure: UPPER ESOPHAGEAL ENDOSCOPIC ULTRASOUND (EUS);  Surgeon: Lemar Lofty., MD;  Location: [facility] ENDOSCOPY;  Service: Gastroenterology;  Laterality: N/A;   Patient Active Problem List   Diagnosis Date Noted   Pain in right knee 02/06/2023   Hospital discharge follow-up 12/04/2022   Sickle cell pain crisis (HCC) 11/21/2022   GI bleed 11/20/2022   Cirrhosis (HCC) 10/21/2022   Hypotension 10/21/2022   Thrombocytopenia (HCC) 10/21/2022   Wound infection 10/02/2022   Legally blind 10/02/2022   Atrial fibrillation with RVR (HCC) 07/23/2022   Hypoglycemia 07/23/2022   Cystic lesion of abdominal viscera 04/06/2022   Bile leak    Cholelithiasis 11/30/2021   Chronic pain syndrome    Persistent atrial fibrillation (HCC) 09/13/2021   Symptomatic anemia 09/13/2021   Abdominal fluid collection 08/22/2021   Gastric AVM    Hiatal hernia    History of GI bleed 06/27/2021   Visual impairment 06/27/2021   COPD (chronic obstructive pulmonary disease) (HCC) 06/27/2021   Acute gangrenous cholecystitis s/p open cholcystectomy 05/24/2021 05/27/2021   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 05/24/2021 05/27/2021  Protein-calorie malnutrition, moderate (HCC) 05/23/2021   Prolonged QT interval 08/02/2020   Chronic anticoagulation    Tobacco dependence 05/28/2020   Chronic systolic CHF (congestive heart failure) (HCC) 09/11/2019   Encounter for therapeutic drug monitoring 12/29/2015   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   Permanent atrial  fibrillation (HCC) 12/24/2015   ESRD (end stage renal disease) (HCC) 05/06/2014   Vitamin D deficiency 09/25/2013   CKD (chronic kidney disease), stage IV (HCC) 09/25/2013   Onychomycosis of toenail 08/27/2013   Sickle cell disease (HCC) 11/21/2012   Anemia 01/17/2012   Sickle cell crisis (HCC) 01/17/2012   NSVT (nonsustained ventricular tachycardia) (HCC) 10/21/2011   Gout 10/19/2011    PCP: Quentin Angst, MD  REFERRING PROVIDER: Reymundo Poll, MD  REFERRING DIAG: [phone] (ICD-10-CM) - Chronic pain of right knee  THERAPY DIAG:  Chronic pain of right knee  Other symptoms and signs involving the musculoskeletal system  Muscle weakness (generalized)  Unsteadiness on feet  Rationale for Evaluation and Treatment: Rehabilitation  ONSET DATE: 1 year ago  SUBJECTIVE:   SUBJECTIVE STATEMENT: "Amada Jupiter"  Pt has had this right knee pain for about a year without recalled mechanism of injury.  He denies recent falls or other acute concerns.  PERTINENT HISTORY: Afib, CHF, CKD w/ R AV fistula (Tues/Th/Sat dialysis), legally blind, Sickle cell disease type SS w/o crisis  PAIN:  Are you having pain? No  PRECAUTIONS: Fall and Other: R AV fistula  RED FLAGS: None   WEIGHT BEARING RESTRICTIONS: No  FALLS:  Has patient fallen in last 6 months? 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  DIAGNOSTIC FINDINGS:  Right Knee x-ray 11/02/2022: IMPRESSION: 1. Mild-to-moderate tricompartmental degenerative  changes. 2. Moderate suprapatellar joint effusion.  PATIENT SURVEYS:  KOOS:  27%  COGNITION: Overall cognitive status: Within functional limits for tasks assessed     SENSATION: Light touch: WFL  EDEMA:  Suprapatellar edema worse than infrapatellar  POSTURE: rounded shoulders, forward head, increased thoracic kyphosis, and flexed trunk   PALPATION: Tender w/ kneecap movements all directions, none worse  LOWER EXTREMITY ROM:  Active ROM Right eval Left eval  Hip flexion Grossly WFL.  Hip extension   Hip abduction   Hip adduction   Hip internal rotation   Hip external rotation   Knee flexion   Knee extension   Ankle dorsiflexion   Ankle plantarflexion    Ankle inversion    Ankle eversion     (Blank rows = not tested)  LOWER EXTREMITY MMT:  MMT Right eval Left eval  Hip flexion 4-/5 4/5  Hip extension    Hip abduction 4-/5 4-/5  Hip adduction    Hip internal rotation    Hip external rotation    Knee flexion 4/5 4/5  Knee extension 4-/5 4/5  Ankle dorsiflexion 4+/5 4+/5  Ankle plantarflexion    Ankle inversion    Ankle eversion     (Blank rows = not tested)  LOWER EXTREMITY SPECIAL TESTS:  Knee special tests: pt too tender to palpation  FUNCTIONAL TESTS:  5 times sit to stand:  20.09 seconds w/ BUE support 2 minute walk test: To be assessed.  GAIT: Distance walked: Various clinic distances Assistive device utilized: None Level of assistance: Complete Independence Comments: Mild crouching of gait, no marked LOB or instability noted.  Pt sometimes uses a visual aide cane.   TODAY'S TREATMENT:                                                                                                                              DATE: N/A - eval only.   PATIENT EDUCATION:  Education details: PT POC, assessments used and to be used, and goals to be set. Person educated: Patient Education method: Explanation Education comprehension: verbalized  understanding  HOME EXERCISE PROGRAM: To be established.  ASSESSMENT:  CLINICAL IMPRESSION: Patient is a 67 y.o. male who was seen today for physical therapy evaluation and treatment for chronic bilateral knee pain (right worse than left).  Pt has a significant PMH of Afib, CHF, CKD w/ R AV fistula (Tues/Th/Sat dialysis), legally blind, and Sickle cell disease type SS w/o crisis.  Identified impairments include patellar edema, chronic postural changes, TTP w/ mobilization, and mild BLE weakness and crouched gait.  Evaluation via the following assessment tools: 5xSTS indicates fall risk.  He would benefit from skilled PT to address impairments as noted and progress towards long term goals.   OBJECTIVE IMPAIRMENTS: decreased activity tolerance, difficulty walking, decreased strength, increased edema, improper body mechanics, and pain.   ACTIVITY LIMITATIONS: lifting, squatting, stairs, and locomotion level  PARTICIPATION LIMITATIONS: community activity and occupation  PERSONAL FACTORS: Age, Fitness, Time since onset of injury/illness/exacerbation, and 1-2 comorbidities: dialysis and sickle cell disease  are also affecting patient's functional outcome.   REHAB POTENTIAL: Good  CLINICAL DECISION MAKING: Evolving/moderate complexity  EVALUATION COMPLEXITY: Moderate   GOALS: Goals reviewed with patient? Yes  SHORT TERM GOALS: Target date: 07/05/2023 Pt will be independent and compliant with initial LE pain management, stretching, strength and balance focused HEP in order to maintain functional progress and improve mobility. Baseline:  To be established. Goal status: INITIAL  2.  Pt will decrease 5xSTS to </=16 seconds in order to demonstrate decreased risk for falls and improved functional bilateral LE strength and power. Baseline: 20.09 sec BUE support Goal status: INITIAL  3.  Pt will be independent in management of patellar edema using PRICE principles and education on mobilization  techniques. Baseline: To be reviewed. Goal status: INITIAL  LONG TERM GOALS: Target date: 08/02/2023  Pt will be independent and compliant with advanced and finalized LE pain management, stretching, strength and balance focused HEP in order to maintain functional progress and improve mobility. Baseline: To be established. Goal status: INITIAL  2.  Pt will improve KOOS score to >/=47% to indicate improved pain management, engagement in normal activity, and improved quality of life. Baseline: 27% Goal status: INITIAL  3.  Pt will decrease 5xSTS to </=12 seconds in order to demonstrate decreased risk for falls and  improved functional bilateral LE strength and power. Baseline: 20.09 sec BUE support Goal status: INITIAL  4.  to be assessed w/ LTG set as appropriate. Baseline: To be assessed. Goal status: INITIAL  PLAN:  PT FREQUENCY: 1x/week  PT DURATION: 8 weeks  PLANNED INTERVENTIONS: 97164- PT Re-evaluation, 97110-Therapeutic exercises, 97530- Therapeutic activity, 97112- Neuromuscular re-education, 97535- Self Care, 16109- Manual therapy, [phone]- Gait training, Patient/Family education, Balance training, Stair training, Taping, Joint mobilization, Compression bandaging, Cryotherapy, and Moist heat  PLAN FOR NEXT SESSION: Initiate HEP for self-mobilization, stretching, and strengthening of the BLE.  - set goals.  Edema management w/ PRICE and mobilization techniques.  Try SciFit for strength and edema management?     Sadie Haber, PT, DPT 06/05/2023, 4:14 PM

## 2023-06-05 NOTE — Telephone Encounter (Signed)
Transition of Care - Initial Contact from Inpatient Facility  Date of discharge: 06/04/23 Date of contact: 06/05/23  Method: Phone Spoke to: Patient  Patient contacted to discuss transition of care from recent inpatient hospitalization. Patient was admitted to [facility] from 12/14 - 06/04/23 with discharge diagnosis of symptomatic anemia and hypotension  The discharge medication list was reviewed. Patient understands the changes and has no concerns. Explained that midodrine needs to be taken before HD - he knows.  Patient will return to his/her outpatient HD unit on: tomorrow.  No other concerns at this time.  Ozzie Hoyle, PA-C [employer] Pager [phone]

## 2023-06-06 ENCOUNTER — Telehealth: Payer: Self-pay

## 2023-06-06 DIAGNOSIS — N2581 Secondary hyperparathyroidism of renal origin: Secondary | ICD-10-CM | POA: Diagnosis not present

## 2023-06-06 DIAGNOSIS — N186 End stage renal disease: Secondary | ICD-10-CM | POA: Diagnosis not present

## 2023-06-06 DIAGNOSIS — L299 Pruritus, unspecified: Secondary | ICD-10-CM | POA: Diagnosis not present

## 2023-06-06 DIAGNOSIS — D57219 Sickle-cell/Hb-C disease with crisis, unspecified: Secondary | ICD-10-CM | POA: Diagnosis not present

## 2023-06-06 DIAGNOSIS — D57819 Other sickle-cell disorders with crisis, unspecified: Secondary | ICD-10-CM | POA: Diagnosis not present

## 2023-06-06 DIAGNOSIS — D631 Anemia in chronic kidney disease: Secondary | ICD-10-CM | POA: Diagnosis not present

## 2023-06-06 DIAGNOSIS — D571 Sickle-cell disease without crisis: Secondary | ICD-10-CM | POA: Diagnosis not present

## 2023-06-06 DIAGNOSIS — D5700 Hb-SS disease with crisis, unspecified: Secondary | ICD-10-CM | POA: Diagnosis not present

## 2023-06-06 DIAGNOSIS — Z992 Dependence on renal dialysis: Secondary | ICD-10-CM | POA: Diagnosis not present

## 2023-06-06 NOTE — Transitions of Care (Post Inpatient/ED Visit) (Signed)
   06/06/2023  Name: Patrick [name] MRN: [id number] DOB: Dec 24, 1955  Today's TOC FU Call Status: Today's TOC FU Call Status:: Unsuccessful Call (1st Attempt) Unsuccessful Call (1st Attempt) Date: 06/06/23  Attempted to reach the patient regarding the most recent Inpatient/ED visit.  Follow Up Plan: Additional outreach attempts will be made to reach the patient to complete the Transitions of Care (Post Inpatient/ED visit) call.   Makailah Slavick Daphine Deutscher BSN, RN RN Care Manager   Transitions of Care [facility] Direct Dial Number:  [phone]

## 2023-06-07 ENCOUNTER — Telehealth: Payer: Self-pay

## 2023-06-07 NOTE — Transitions of Care (Post Inpatient/ED Visit) (Signed)
   06/07/2023  Name: Patrick [name] MRN: [id number] DOB: 24-Jul-1955  Today's TOC FU Call Status: Today's TOC FU Call Status:: Unsuccessful Call (2nd Attempt) Unsuccessful Call (2nd Attempt) Date: 06/07/23  Attempted to reach the patient regarding the most recent Inpatient/ED visit.  Follow Up Plan: Additional outreach attempts will be made to reach the patient to complete the Transitions of Care (Post Inpatient/ED visit) call.     Antionette Fairy, RN,BSN,CCM RN Care Manager Transitions of Care  [facility]  Direct Phone: [phone] Toll Free: [phone] Fax: [phone]

## 2023-06-08 ENCOUNTER — Other Ambulatory Visit: Payer: Self-pay

## 2023-06-08 ENCOUNTER — Emergency Department (HOSPITAL_COMMUNITY): Payer: PPO

## 2023-06-08 ENCOUNTER — Emergency Department (HOSPITAL_COMMUNITY)
Admission: EM | Admit: 2023-06-08 | Discharge: 2023-06-08 | Disposition: A | Discharge status: Home / Self Care | Payer: PPO | Attending: Emergency Medicine | Admitting: Emergency Medicine

## 2023-06-08 ENCOUNTER — Encounter (HOSPITAL_COMMUNITY): Payer: Self-pay | Admitting: *Deleted

## 2023-06-08 DIAGNOSIS — D649 Anemia, unspecified: Secondary | ICD-10-CM | POA: Diagnosis not present

## 2023-06-08 DIAGNOSIS — G510 Bell's palsy: Secondary | ICD-10-CM | POA: Insufficient documentation

## 2023-06-08 DIAGNOSIS — R29818 Other symptoms and signs involving the nervous system: Secondary | ICD-10-CM | POA: Diagnosis not present

## 2023-06-08 DIAGNOSIS — N186 End stage renal disease: Secondary | ICD-10-CM | POA: Diagnosis not present

## 2023-06-08 DIAGNOSIS — Z992 Dependence on renal dialysis: Secondary | ICD-10-CM | POA: Insufficient documentation

## 2023-06-08 DIAGNOSIS — R29810 Facial weakness: Secondary | ICD-10-CM | POA: Diagnosis not present

## 2023-06-08 DIAGNOSIS — R200 Anesthesia of skin: Secondary | ICD-10-CM | POA: Diagnosis not present

## 2023-06-08 DIAGNOSIS — R9089 Other abnormal findings on diagnostic imaging of central nervous system: Secondary | ICD-10-CM | POA: Diagnosis not present

## 2023-06-08 LAB — COMPREHENSIVE METABOLIC PANEL
ALT: 15 U/L (ref 0–44)
AST: 36 U/L (ref 15–41)
Albumin: 3.2 g/dL — ABNORMAL LOW (ref 3.5–5.0)
Alkaline Phosphatase: 136 U/L — ABNORMAL HIGH (ref 38–126)
Anion gap: 18 — ABNORMAL HIGH (ref 5–15)
BUN: 40 mg/dL — ABNORMAL HIGH (ref 8–23)
CO2: 26 mmol/L (ref 22–32)
Calcium: 9 mg/dL (ref 8.9–10.3)
Chloride: 92 mmol/L — ABNORMAL LOW (ref 98–111)
Creatinine, Ser: 7.87 mg/dL — ABNORMAL HIGH (ref 0.61–1.24)
GFR, Estimated: 7 mL/min — ABNORMAL LOW (ref >60)
Glucose, Bld: 86 mg/dL (ref 70–99)
Potassium: 4.3 mmol/L (ref 3.5–5.1)
Sodium: 136 mmol/L (ref 135–145)
Total Bilirubin: 1.9 mg/dL — ABNORMAL HIGH (ref <1.2)
Total Protein: 7.1 g/dL (ref 6.5–8.1)

## 2023-06-08 LAB — CBC WITH DIFFERENTIAL/PLATELET
Abs Immature Granulocytes: 0.07 10*3/uL (ref 0.00–0.07)
Basophils Absolute: 0.1 10*3/uL (ref 0.0–0.1)
Basophils Relative: 2 %
Eosinophils Absolute: 0.2 10*3/uL (ref 0.0–0.5)
Eosinophils Relative: 3 %
HCT: 18.3 % — ABNORMAL LOW (ref 39.0–52.0)
Hemoglobin: 6.5 g/dL — CL (ref 13.0–17.0)
Immature Granulocytes: 1 %
Lymphocytes Relative: 16 %
Lymphs Abs: 1.1 10*3/uL (ref 0.7–4.0)
MCH: 38 pg — ABNORMAL HIGH (ref 26.0–34.0)
MCHC: 35.5 g/dL (ref 30.0–36.0)
MCV: 107 fL — ABNORMAL HIGH (ref 80.0–100.0)
Monocytes Absolute: 1.2 10*3/uL — ABNORMAL HIGH (ref 0.1–1.0)
Monocytes Relative: 18 %
Neutro Abs: 4.2 10*3/uL (ref 1.7–7.7)
Neutrophils Relative %: 60 %
Platelets: 125 10*3/uL — ABNORMAL LOW (ref 150–400)
RBC: 1.71 MIL/uL — ABNORMAL LOW (ref 4.22–5.81)
RDW: 18.1 % — ABNORMAL HIGH (ref 11.5–15.5)
WBC: 6.9 10*3/uL (ref 4.0–10.5)
nRBC: 1.6 % — ABNORMAL HIGH (ref 0.0–0.2)

## 2023-06-08 MED ORDER — PREDNISONE 10 MG PO TABS: 60.0000 mg | ORAL_TABLET | Freq: Every day | ORAL | 0 refills | Status: AC

## 2023-06-08 MED ORDER — VALACYCLOVIR HCL 500 MG PO TABS
500.0000 mg | ORAL_TABLET | Freq: Every day | ORAL | Status: DC
  Administered 2023-06-08: 500 mg via ORAL
  Filled 2023-06-08: qty 1

## 2023-06-08 MED ORDER — VALACYCLOVIR HCL 500 MG PO TABS: 500.0000 mg | ORAL_TABLET | Freq: Every day | ORAL | 0 refills | Status: AC

## 2023-06-08 MED ORDER — OXYCODONE HCL 5 MG PO TABS
10.0000 mg | ORAL_TABLET | Freq: Four times a day (QID) | ORAL | Status: DC | PRN
  Administered 2023-06-08: 10 mg via ORAL
  Filled 2023-06-08: qty 2

## 2023-06-08 MED ORDER — PREDNISONE 20 MG PO TABS
60.0000 mg | ORAL_TABLET | Freq: Once | ORAL | Status: AC
Start: 2023-06-08 — End: 2023-06-08
  Administered 2023-06-08: 60 mg via ORAL
  Filled 2023-06-08: qty 3

## 2023-06-08 MED ORDER — MIDODRINE HCL 5 MG PO TABS
10.0000 mg | ORAL_TABLET | Freq: Three times a day (TID) | ORAL | Status: DC
  Administered 2023-06-08: 10 mg via ORAL
  Filled 2023-06-08: qty 2

## 2023-06-08 NOTE — ED Provider Notes (Signed)
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 male presented to the emergency department with concern for right-sided facial droop and numbness.  Patient reports he noted the symptoms 2 days ago when he woke up, Friday morning.  He denies headache.  He reports he did have surgery on his right eye in the past but denies any loss of vision.  He says he noted he was drooling when he was eating.  He denies prior history of stroke or Bell's palsy.  He is not diabetic.   Pt has ESRD on dialysis has not missed any sessions; hx of sickle cell disease but not having crisis symptoms  Hx of hypotension, anemia - hospitalized with blood transfusions earlier this week -per my review of external charts and discharge planning, transfusion threshold was lowered to 6 during admission unless he becomes symptomatic.  He denies lightheadedness or dizziness  HPI     Home Medications Prior to Admission medications   Medication Sig Start Date End Date Taking? Authorizing Provider  predniSONE (DELTASONE) 10 MG tablet Take 6 tablets (60 mg total) by mouth daily with breakfast for 4 days. 06/09/23 06/13/23 Yes Dontrail Blackwell, Kermit Balo, MD  valACYclovir (VALTREX) 500 MG tablet Take 1 tablet (500 mg total) by mouth daily for 6 days. On dialysis days, take dose AFTER dialysis 06/09/23 06/15/23 Yes Tommy Minichiello, Kermit Balo, MD  acetaminophen (TYLENOL) 500 MG tablet Take 1,500 mg by mouth 3 (three) times daily as needed for mild pain, moderate pain, fever or headache.    [provider]  allopurinol (ZYLOPRIM) 100 MG tablet Take 1 tablet (100 mg total) by mouth Every Tuesday,Thursday,and Saturday with dialysis. 06/04/23   Faith Rogue, DO  AURYXIA 1 GM 210 MG(Fe) tablet Take 210-420 mg by mouth See admin instructions. 420 mg with every  meal, 210 mg with each snack. 02/11/21   [provider]  camphor-menthol Wynelle Fanny) lotion Apply 1 Application topically as needed for itching. 04/23/23   Massie Maroon, FNP  cinacalcet (SENSIPAR) 30 MG tablet Take 1 tablet (30 mg total) by mouth Every Tuesday,Thursday,and Saturday with dialysis. 06/12/22   Shahmehdi, Gemma Payor, MD  diphenhydramine-acetaminophen (TYLENOL PM) 25-500 MG TABS tablet Take 2 tablets by mouth at bedtime as needed (sleep/pain). 09/26/22   Ivonne Andrew, NP  folic acid (FOLVITE) 1 MG tablet TAKE 1 TABLET BY MOUTH EVERY DAY 04/08/23   Massie Maroon, FNP  hydroxyurea (HYDREA) 500 MG capsule Take 1 capsule (500 mg total) by mouth daily. 05/12/23   Ivonne Andrew, NP  lactulose (CHRONULAC) 10 GM/15ML solution Take 30 mLs (20 g total) by mouth 2 (two) times daily as needed for mild constipation. Patient not taking: Reported on 06/01/2023 10/23/22   Alberteen Sam, MD  lidocaine-prilocaine (EMLA) cream Apply 1 application. topically Every Tuesday,Thursday,and Saturday with dialysis. 09/08/19   [provider]  midodrine (PROAMATINE) 10 MG tablet Take 1 tablet (10 mg total) by mouth 3 (three) times daily with meals. 06/04/23   Faith Rogue, DO  ofloxacin (OCUFLOX) 0.3 % ophthalmic solution Place 1 drop into both eyes 2 (two) times daily. 03/31/23   [provider]  oxyCODONE (OXYCONTIN) 10 mg 12 hr tablet Take 1 tablet (10 mg total) by mouth every 12 (twelve) hours. 05/12/23  Ivonne Andrew, NP  Oxycodone HCl 10 MG TABS Take 1 tablet (10 mg total) by mouth every 6 (six) hours as needed. Patient not taking: Reported on 06/01/2023 05/12/23   Ivonne Andrew, NP  pantoprazole (PROTONIX) 40 MG tablet Take 1 tablet (40 mg total) by mouth daily. Use this prescription after done with twice daily dosing x 2 months 06/04/23   Faith Rogue, DO  prednisoLONE acetate (PRED FORTE) 1 % ophthalmic suspension Place 1 drop into both eyes 2 (two) times  daily. 04/03/23   [provider]  timolol (TIMOPTIC) 0.5 % ophthalmic solution Place 1 drop into both eyes 2 (two) times daily. 05/14/23   [provider]  tiZANidine (ZANAFLEX) 4 MG tablet TAKE 1/2 TABLET (2MG  TOTAL) BY MOUTH EVERY 8 HOURS AS NEEDED FOR MUSCLE SPASM 05/12/23   Ivonne Andrew, NP  torsemide (DEMADEX) 20 MG tablet TAKE 2 TABLETS BY MOUTH IN THE MORNING AND AT BEDTIME 01/09/23   Massie Maroon, FNP      Allergies    Patient has no known allergies.    Review of Systems   Review of Systems  Physical Exam Updated Vital Signs BP (!) 93/58   Pulse 78   Temp 97.7 F (36.5 C) (Oral)   Resp 11   Ht 5\' 11"  (1.803 m)   Wt 65.3 kg   SpO2 100%   BMI 20.08 kg/m  Physical Exam Constitutional:      General: He is not in acute distress. HENT:     Head: Normocephalic and atraumatic.  Eyes:     Conjunctiva/sclera: Conjunctivae normal.     Pupils: Pupils are equal, round, and reactive to light.  Cardiovascular:     Rate and Rhythm: Normal rate and regular rhythm.  Pulmonary:     Effort: Pulmonary effort is normal. No respiratory distress.  Abdominal:     General: There is no distension.     Tenderness: There is no abdominal tenderness.  Skin:    General: Skin is warm and dry.  Neurological:     General: No focal deficit present.     Mental Status: He is alert. Mental status is at baseline.     Comments: Right-sided facial droop that involves extraocular muscle and the forehead muscles, right-sided paresthesia, patient is not able to completely close right eye on exam, no other cranial nerve deficits noted; no other neurological deficits on exam  Psychiatric:        Mood and Affect: Mood normal.        Behavior: Behavior normal.     ED Results / Procedures / Treatments   Labs (all labs ordered are listed, but only abnormal results are displayed) Labs Reviewed  CBC WITH DIFFERENTIAL/PLATELET - Abnormal; Notable for the following components:       Result Value   RBC 1.71 (*)    Hemoglobin 6.5 (*)    HCT 18.3 (*)    MCV 107.0 (*)    MCH 38.0 (*)    RDW 18.1 (*)    Platelets 125 (*)    nRBC 1.6 (*)    Monocytes Absolute 1.2 (*)    All other components within normal limits  COMPREHENSIVE METABOLIC PANEL - Abnormal; Notable for the following components:   Chloride 92 (*)    BUN 40 (*)    Creatinine, Ser 7.87 (*)    Albumin 3.2 (*)    Alkaline Phosphatase 136 (*)    Total Bilirubin 1.9 (*)    GFR,  Estimated 7 (*)    Anion gap 18 (*)    All other components within normal limits    EKG None  Radiology MR BRAIN WO CONTRAST Result Date: 06/08/2023 CLINICAL DATA:  Provided history: Neuro deficit, acute, stroke suspected. Right facial numbness. Facial droop. EXAM: MRI HEAD WITHOUT CONTRAST TECHNIQUE: Multiplanar, multiecho pulse sequences of the brain and surrounding structures were obtained without intravenous contrast. COMPARISON:  None FINDINGS: Brain: Generalized cerebral and cerebellar volume loss. Moderate-sized chronic cortical/subcortical left MCA territory infarct within the mid left frontal lobe/frontal operculum. Chronic hemosiderin deposition at this site. Chronic lacunar infarct (with associated chronic blood products) within the right corona radiata). Small chronic cortically-radiology was scalp old okay acute 0 0 CIS cold good is: Above the noncontrast head CT on Randy Monro of no acute finding on the noncontrast head CT of 1 of the other neuro rads is reading the CTA now open okay I either medium E there is 2 neuro radiologist on right now severe merely other narrow Brynda Greathouse will repeat a sinister done with the sites no bleed no note no acute finding based infarct within the medial left temporal lobe/hippocampus. Mild-to-moderate multifocal T2 FLAIR hyperintense signal abnormality elsewhere within the cerebral white matter, nonspecific but compatible with chronic small vessel ischemic disease. Chronic microhemorrhage along  the atrium of the left lateral ventricle. Small chronic infarct (with associated chronic hemosiderin deposition) within the right cerebellar hemisphere. There is no acute infarct. No evidence of an intracranial mass. No extra-axial fluid collection. No midline shift. Vascular: Maintained flow voids within the proximal large arterial vessels. Skull and upper cervical spine: Abnormal T1 hypointense marrow signal within the calvarium and within visualized portions of the cervical spine. Additionally, there is a somewhat expanded appearance of the skull Sinuses/Orbits: No mass or acute finding within the imaged orbits. Postprocedural changes to the right globe. No significant paranasal sinus disease. IMPRESSION: 1. No evidence of an acute intracranial abnormality. 2. Parenchymal atrophy, chronic small vessel disease, chronic infarcts and sites of chronic hemorrhage as described. 3. Abnormal T1 hypointense marrow signal within the calvarium and within visualized portions of the cervical spine. Additionally, there is a somewhat expanded appearance of the skull. These findings likely reflect sequela of sickle cell disease given the patient's history. Electronically Signed   By: Jackey Loge D.O.   On: 06/08/2023 10:31    Procedures Procedures    Medications Ordered in ED Medications  midodrine (PROAMATINE) tablet 10 mg (has no administration in time range)  oxyCODONE (Oxy IR/ROXICODONE) immediate release tablet 10 mg (has no administration in time range)  predniSONE (DELTASONE) tablet 60 mg (has no administration in time range)  valACYclovir (VALTREX) tablet 500 mg (has no administration in time range)    ED Course/ Medical Decision Making/ A&P                                 Medical Decision Making Amount and/or Complexity of Data Reviewed Radiology: ordered.  Risk Prescription drug management.   This patient presents to the ED with concern for right-sided facial droop and numbness. This  involves an extensive number of treatment options, and is a complaint that carries with it a high risk of complications and morbidity.  The differential diagnosis includes Bell's palsy most likely given the involvement of the forehead muscle and facial nerve pattern, versus stroke, versus metabolic derangement, versus other  Co-morbidities that complicate the patient evaluation: Patient does  have a history of stroke risk factors  Additional history obtained from patient's family member at bedside  External records from outside source obtained and reviewed including recent hospital discharge summary from December 17  I ordered and personally interpreted labs.  The pertinent results include: Patient has chronic anemia with hemoglobin 6.3.  This remains above his transfusion threshold my review of external records.  He is not symptomatic from an anemic standpoint and does not have symptoms of active GI bleeding, and is also not on anticoagulation.  I ordered imaging studies including MRI of the brain I independently visualized and interpreted imaging which showed no emergent findings, incidental changes noted related likely to sickle cell disease I agree with the radiologist interpretation   I ordered medication including home pain medication and blood pressure medicine with midodrine.  I also ordered prednisone and Valtrex which is renally dosed, for suspected Bell's palsy  I have reviewed the patients home medicines and have made adjustments as needed  Test Considered: Low suspicion for acute meningitis, no indication for emergent LP  After the interventions noted above, I reevaluated the patient and found that they have: stayed the same   Based on his clinical workup and presentation I suspect the patient has Bell's palsy.  He is having some pain or discomfort near his ear but I do not see an active herpes simplex outbreak.  I think antiviral treatment would be reasonable have started him on  Valtrex at renally dosed 500 mg once daily.  Dispostion:  After consideration of the diagnostic results and the patients response to treatment, I feel that the patent would benefit from outpatient follow-up.  The patient's son is comfortable taking him home.  I answered all of their questions.          Final Clinical Impression(s) / ED Diagnoses Final diagnoses:  Bell's palsy    Rx / DC Orders ED Discharge Orders          Ordered    valACYclovir (VALTREX) 500 MG tablet  Daily        06/08/23 1050    predniSONE (DELTASONE) 10 MG tablet  Daily with breakfast        06/08/23 1050              Terald Sleeper, MD 06/08/23 1052

## 2023-06-08 NOTE — ED Triage Notes (Addendum)
States he thinks he had a stoke , last known well thurs pm. C/o pain right side of face with swelling , speech is clear, denies numbness or weakness in ext. Patient is a dialysis patient  T-TH-S

## 2023-06-08 NOTE — ED Provider Triage Note (Addendum)
Emergency Medicine Provider Triage Evaluation Note  Patrick [name] , a 67 y.o. male  was evaluated in triage.  Pt complains of right sided facial droop since Thursday night/Friday morning. He denies any vision changes or recent headaches. Denies any trouble walking. Feels like his speech is different with his facial droop. Denies any recent illnesses.   **Nursing note mentions pain, but he reports it is non painful to me.   Review of Systems  Positive:  Negative:   Physical Exam  BP 92/63 (BP Location: Right Arm)   Pulse 85   Temp 97.7 F (36.5 C) (Oral)   Resp 17   Ht 5\' 11"  (1.803 m)   Wt 65.3 kg   SpO2 96%   BMI 20.08 kg/m  Gen:   Awake, no distress   Resp:  Normal effort  MSK:   Moves extremities without difficulty  Other:  Right sided facial droop involving the forehead. Laxity of forehead wrinkles present as well. Reports sensation is symmetric in the face however. Strength grossly intact and symmetric.  Medical Decision Making  Medically screening exam initiated at 7:06 AM.  Appropriate orders placed.  Leng D Holsworth was informed that the remainder of the evaluation will be completed by another provider, this initial triage assessment does not replace that evaluation, and the importance of remaining in the ED until their evaluation is complete.  I think the patient likely has Bell's Palsy given the involvement of the forehead. LKW was Thursday night at least. Out of any stroke window. Labs ordered.    Achille Rich, PA-C 06/08/23 0710    Achille Rich, PA-C 06/08/23 [phone]

## 2023-06-08 NOTE — Discharge Instructions (Addendum)
Most cases of Bell's palsy improve and resolve on their own within a month.  If you have persistent facial droop or weakness, you can call to make an appointment with a neurologist at the number above.  We talked about buying lubricating eyedrops at the pharmacy over-the-counter to keep your right eye lubricated.  At night you can gently tape the eye shut so that it does not dry out or get scratched on the pillow.  *  On dialysis days take your medications AFTER finishing dialysis.

## 2023-06-10 DIAGNOSIS — L299 Pruritus, unspecified: Secondary | ICD-10-CM | POA: Diagnosis not present

## 2023-06-10 DIAGNOSIS — D57219 Sickle-cell/Hb-C disease with crisis, unspecified: Secondary | ICD-10-CM | POA: Diagnosis not present

## 2023-06-10 DIAGNOSIS — D571 Sickle-cell disease without crisis: Secondary | ICD-10-CM | POA: Diagnosis not present

## 2023-06-10 DIAGNOSIS — D57819 Other sickle-cell disorders with crisis, unspecified: Secondary | ICD-10-CM | POA: Diagnosis not present

## 2023-06-10 DIAGNOSIS — D5700 Hb-SS disease with crisis, unspecified: Secondary | ICD-10-CM | POA: Diagnosis not present

## 2023-06-10 DIAGNOSIS — N186 End stage renal disease: Secondary | ICD-10-CM | POA: Diagnosis not present

## 2023-06-10 DIAGNOSIS — N2581 Secondary hyperparathyroidism of renal origin: Secondary | ICD-10-CM | POA: Diagnosis not present

## 2023-06-10 DIAGNOSIS — D631 Anemia in chronic kidney disease: Secondary | ICD-10-CM | POA: Diagnosis not present

## 2023-06-10 DIAGNOSIS — Z992 Dependence on renal dialysis: Secondary | ICD-10-CM | POA: Diagnosis not present

## 2023-06-13 ENCOUNTER — Emergency Department (HOSPITAL_COMMUNITY): Payer: PPO

## 2023-06-13 ENCOUNTER — Emergency Department (HOSPITAL_COMMUNITY)
Admission: EM | Admit: 2023-06-13 | Discharge: 2023-06-13 | Disposition: A | Discharge status: Home / Self Care | Payer: PPO | Attending: Emergency Medicine | Admitting: Emergency Medicine

## 2023-06-13 ENCOUNTER — Other Ambulatory Visit: Payer: Self-pay

## 2023-06-13 ENCOUNTER — Encounter (HOSPITAL_COMMUNITY): Payer: Self-pay

## 2023-06-13 DIAGNOSIS — J90 Pleural effusion, not elsewhere classified: Secondary | ICD-10-CM | POA: Diagnosis not present

## 2023-06-13 DIAGNOSIS — I5022 Chronic systolic (congestive) heart failure: Secondary | ICD-10-CM | POA: Diagnosis not present

## 2023-06-13 DIAGNOSIS — Z87891 Personal history of nicotine dependence: Secondary | ICD-10-CM | POA: Insufficient documentation

## 2023-06-13 DIAGNOSIS — R918 Other nonspecific abnormal finding of lung field: Secondary | ICD-10-CM | POA: Diagnosis not present

## 2023-06-13 DIAGNOSIS — R0602 Shortness of breath: Secondary | ICD-10-CM | POA: Insufficient documentation

## 2023-06-13 DIAGNOSIS — Z20822 Contact with and (suspected) exposure to covid-19: Secondary | ICD-10-CM | POA: Diagnosis not present

## 2023-06-13 DIAGNOSIS — G510 Bell's palsy: Secondary | ICD-10-CM | POA: Insufficient documentation

## 2023-06-13 DIAGNOSIS — R0789 Other chest pain: Secondary | ICD-10-CM | POA: Diagnosis not present

## 2023-06-13 DIAGNOSIS — N185 Chronic kidney disease, stage 5: Secondary | ICD-10-CM | POA: Insufficient documentation

## 2023-06-13 DIAGNOSIS — I4821 Permanent atrial fibrillation: Secondary | ICD-10-CM | POA: Insufficient documentation

## 2023-06-13 DIAGNOSIS — D5700 Hb-SS disease with crisis, unspecified: Secondary | ICD-10-CM | POA: Diagnosis not present

## 2023-06-13 DIAGNOSIS — N186 End stage renal disease: Secondary | ICD-10-CM | POA: Insufficient documentation

## 2023-06-13 DIAGNOSIS — Z1152 Encounter for screening for COVID-19: Secondary | ICD-10-CM | POA: Insufficient documentation

## 2023-06-13 DIAGNOSIS — I509 Heart failure, unspecified: Secondary | ICD-10-CM | POA: Insufficient documentation

## 2023-06-13 LAB — COMPREHENSIVE METABOLIC PANEL
ALT: 21 U/L (ref 0–44)
AST: 24 U/L (ref 15–41)
Albumin: 3.6 g/dL (ref 3.5–5.0)
Alkaline Phosphatase: 153 U/L — ABNORMAL HIGH (ref 38–126)
Anion gap: 14 (ref 5–15)
BUN: 18 mg/dL (ref 8–23)
CO2: 30 mmol/L (ref 22–32)
Calcium: 9 mg/dL (ref 8.9–10.3)
Chloride: 92 mmol/L — ABNORMAL LOW (ref 98–111)
Creatinine, Ser: 4.33 mg/dL — ABNORMAL HIGH (ref 0.61–1.24)
GFR, Estimated: 14 mL/min — ABNORMAL LOW (ref >60)
Glucose, Bld: 96 mg/dL (ref 70–99)
Potassium: 3.3 mmol/L — ABNORMAL LOW (ref 3.5–5.1)
Sodium: 136 mmol/L (ref 135–145)
Total Bilirubin: 2.8 mg/dL — ABNORMAL HIGH (ref <1.2)
Total Protein: 7.4 g/dL (ref 6.5–8.1)

## 2023-06-13 LAB — CBC WITH DIFFERENTIAL/PLATELET
Abs Immature Granulocytes: 0.12 10*3/uL — ABNORMAL HIGH (ref 0.00–0.07)
Basophils Absolute: 0 10*3/uL (ref 0.0–0.1)
Basophils Relative: 0 %
Eosinophils Absolute: 0 10*3/uL (ref 0.0–0.5)
Eosinophils Relative: 0 %
HCT: 19.2 % — ABNORMAL LOW (ref 39.0–52.0)
Hemoglobin: 7.1 g/dL — ABNORMAL LOW (ref 13.0–17.0)
Immature Granulocytes: 1 %
Lymphocytes Relative: 12 %
Lymphs Abs: 1.1 10*3/uL (ref 0.7–4.0)
MCH: 39.2 pg — ABNORMAL HIGH (ref 26.0–34.0)
MCHC: 37 g/dL — ABNORMAL HIGH (ref 30.0–36.0)
MCV: 106.1 fL — ABNORMAL HIGH (ref 80.0–100.0)
Monocytes Absolute: 1.5 10*3/uL — ABNORMAL HIGH (ref 0.1–1.0)
Monocytes Relative: 16 %
Neutro Abs: 6.4 10*3/uL (ref 1.7–7.7)
Neutrophils Relative %: 71 %
Platelets: 141 10*3/uL — ABNORMAL LOW (ref 150–400)
RBC: 1.81 MIL/uL — ABNORMAL LOW (ref 4.22–5.81)
RDW: 19.4 % — ABNORMAL HIGH (ref 11.5–15.5)
WBC: 9.1 10*3/uL (ref 4.0–10.5)
nRBC: 43 % — ABNORMAL HIGH (ref 0.0–0.2)

## 2023-06-13 LAB — RETICULOCYTES
Immature Retic Fract: 28.3 % — ABNORMAL HIGH (ref 2.3–15.9)
RBC.: 1.8 MIL/uL — ABNORMAL LOW (ref 4.22–5.81)
Retic Count, Absolute: 122.8 10*3/uL (ref 19.0–186.0)
Retic Ct Pct: 6.8 % — ABNORMAL HIGH (ref 0.4–3.1)

## 2023-06-13 LAB — RESP PANEL BY RT-PCR (RSV, FLU A&B, COVID)  RVPGX2
Influenza A by PCR: NEGATIVE
Influenza B by PCR: NEGATIVE
Resp Syncytial Virus by PCR: NEGATIVE
SARS Coronavirus 2 by RT PCR: NEGATIVE

## 2023-06-13 LAB — TROPONIN I (HIGH SENSITIVITY): Troponin I (High Sensitivity): 29 ng/L — ABNORMAL HIGH (ref <18)

## 2023-06-13 MED ORDER — HYDROMORPHONE HCL 1 MG/ML IJ SOLN
1.0000 mg | Freq: Once | INTRAMUSCULAR | Status: AC
  Administered 2023-06-13: 1 mg via SUBCUTANEOUS
  Filled 2023-06-13: qty 1

## 2023-06-13 MED ORDER — HYDROMORPHONE HCL 1 MG/ML IJ SOLN
1.0000 mg | Freq: Once | INTRAMUSCULAR | Status: AC
  Administered 2023-06-13: 1 mg via INTRAVENOUS
  Filled 2023-06-13: qty 1

## 2023-06-13 MED ORDER — OXYCODONE HCL 5 MG PO TABS: 10.0000 mg | ORAL_TABLET | Freq: Four times a day (QID) | ORAL | 0 refills | Status: DC | PRN

## 2023-06-13 MED ORDER — DIPHENHYDRAMINE HCL 25 MG PO CAPS
25.0000 mg | ORAL_CAPSULE | Freq: Once | ORAL | Status: AC
  Administered 2023-06-13: 25 mg via ORAL
  Filled 2023-06-13: qty 1

## 2023-06-13 MED ORDER — ACETAMINOPHEN 500 MG PO TABS
1000.0000 mg | ORAL_TABLET | Freq: Once | ORAL | Status: AC
  Administered 2023-06-13: 1000 mg via ORAL
  Filled 2023-06-13: qty 2

## 2023-06-13 NOTE — Discharge Instructions (Addendum)
Please continue taking all medications as prescribed.  You may take an additional 5 to 10 mg of oxycodone as needed for severe pain.  Please call your hematologist on Monday to set up a follow-up appointment.  Return to the emergency department if you are unable to manage her pain at home, develop fever, cough or shortness of breath.

## 2023-06-13 NOTE — ED Provider Notes (Signed)
[place] EMERGENCY DEPARTMENT AT [facility] Provider Note  CSN: [id number] Arrival date & time: 06/13/23 1406  Chief Complaint(s) Sickle Cell Pain Crisis  HPI Patrick [name] is a 67 y.o. male here today for arm and leg pain which he thinks is his sickle cell pain.  Patient says that he was at his dialysis session today, began to experience some pain in his arms and legs which he says is consistent with his sickle cell pain.  He is also felt at times as though he has been a little bit short of breath but denies fever.  Did complete full dialysis session today.  Currently being treated for Bell's palsy.   Past Medical History Past Medical History:  Diagnosis Date   A-fib (HCC)    Blood dyscrasia    Blood transfusion    "I've had a bunch of them"   CHF (congestive heart failure) (HCC)    followed by hf clinic   Chronic kidney disease    Stage 5, dialysis on Tu/Th/Sa   Dialysis patient (HCC) 04/2019   AV fistula (right arm), TTS   Dysrhythmia    A-Fib per patient   Early satiety    Elevated LFTs 04/01/2022   Elevated troponin 04/01/2022   Empyema of gallbladder 05/27/2021   GERD (gastroesophageal reflux disease)    Gout    Legally blind    Metabolic acidosis with increased anion gap and accumulation of organic acids 04/01/2022   Mitral regurgitation    Muscle tightness-Right arm  05/27/2014   Pneumonia    Pulmonary hypertension (HCC)    Shortness of breath dyspnea    Sickle cell disease, type SS (HCC)    Swelling abdomen    Swelling of extremity, left    Swelling of extremity, right    Tricuspid regurgitation    Patient Active Problem List   Diagnosis Date Noted   Pain in right knee 02/06/2023   Hospital discharge follow-up 12/04/2022   Sickle cell pain crisis (HCC) 11/21/2022   GI bleed 11/20/2022   Cirrhosis (HCC) 10/21/2022   Hypotension 10/21/2022   Thrombocytopenia (HCC) 10/21/2022   Wound infection 10/02/2022   Legally blind 10/02/2022    Atrial fibrillation with RVR (HCC) 07/23/2022   Hypoglycemia 07/23/2022   Cystic lesion of abdominal viscera 04/06/2022   Bile leak    Cholelithiasis 11/30/2021   Chronic pain syndrome    Persistent atrial fibrillation (HCC) 09/13/2021   Symptomatic anemia 09/13/2021   Abdominal fluid collection 08/22/2021   Gastric AVM    Hiatal hernia    History of GI bleed 06/27/2021   Visual impairment 06/27/2021   COPD (chronic obstructive pulmonary disease) (HCC) 06/27/2021   Acute gangrenous cholecystitis s/p open cholcystectomy 05/24/2021 05/27/2021   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 05/24/2021 05/27/2021   Protein-calorie malnutrition, moderate (HCC) 05/23/2021   Prolonged QT interval 08/02/2020   Chronic anticoagulation    Tobacco dependence 05/28/2020   Chronic systolic CHF (congestive heart failure) (HCC) 09/11/2019   Encounter for therapeutic drug monitoring 12/29/2015   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   Permanent atrial fibrillation (HCC) 12/24/2015   ESRD (end stage renal disease) (HCC) 05/06/2014   Vitamin D deficiency 09/25/2013   CKD (chronic kidney disease), stage IV (HCC) 09/25/2013   Onychomycosis of toenail 08/27/2013   Sickle cell disease (HCC) 11/21/2012   Anemia 01/17/2012   Sickle cell crisis (HCC) 01/17/2012   NSVT (nonsustained ventricular tachycardia) (HCC) 10/21/2011   Gout 10/19/2011   Home  Medication(s) Prior to Admission medications   Medication Sig Start Date End Date Taking? Authorizing Provider  oxyCODONE (ROXICODONE) 5 MG immediate release tablet Take 2 tablets (10 mg total) by mouth every 6 (six) hours as needed for severe pain (pain score 7-10) or breakthrough pain. 06/13/23  Yes Anders Simmonds T, DO  acetaminophen (TYLENOL) 500 MG tablet Take 1,500 mg by mouth 3 (three) times daily as needed for mild pain, moderate pain, fever or headache.    [provider]  allopurinol (ZYLOPRIM) 100 MG tablet Take 1 tablet (100 mg total) by  mouth Every Tuesday,Thursday,and Saturday with dialysis. 06/04/23   Faith Rogue, DO  AURYXIA 1 GM 210 MG(Fe) tablet Take 210-420 mg by mouth See admin instructions. 420 mg with every meal, 210 mg with each snack. 02/11/21   [provider]  camphor-menthol Wynelle Fanny) lotion Apply 1 Application topically as needed for itching. 04/23/23   Massie Maroon, FNP  cinacalcet (SENSIPAR) 30 MG tablet Take 1 tablet (30 mg total) by mouth Every Tuesday,Thursday,and Saturday with dialysis. 06/12/22   Shahmehdi, Gemma Payor, MD  diphenhydramine-acetaminophen (TYLENOL PM) 25-500 MG TABS tablet Take 2 tablets by mouth at bedtime as needed (sleep/pain). 09/26/22   Ivonne Andrew, NP  folic acid (FOLVITE) 1 MG tablet TAKE 1 TABLET BY MOUTH EVERY DAY 04/08/23   Massie Maroon, FNP  hydroxyurea (HYDREA) 500 MG capsule Take 1 capsule (500 mg total) by mouth daily. 05/12/23   Ivonne Andrew, NP  lactulose (CHRONULAC) 10 GM/15ML solution Take 30 mLs (20 g total) by mouth 2 (two) times daily as needed for mild constipation. Patient not taking: Reported on 06/01/2023 10/23/22   Alberteen Sam, MD  lidocaine-prilocaine (EMLA) cream Apply 1 application. topically Every Tuesday,Thursday,and Saturday with dialysis. 09/08/19   [provider]  midodrine (PROAMATINE) 10 MG tablet Take 1 tablet (10 mg total) by mouth 3 (three) times daily with meals. 06/04/23   Faith Rogue, DO  ofloxacin (OCUFLOX) 0.3 % ophthalmic solution Place 1 drop into both eyes 2 (two) times daily. 03/31/23   [provider]  oxyCODONE (OXYCONTIN) 10 mg 12 hr tablet Take 1 tablet (10 mg total) by mouth every 12 (twelve) hours. 05/12/23   Ivonne Andrew, NP  Oxycodone HCl 10 MG TABS Take 1 tablet (10 mg total) by mouth every 6 (six) hours as needed. Patient not taking: Reported on 06/01/2023 05/12/23   Ivonne Andrew, NP  pantoprazole (PROTONIX) 40 MG tablet Take 1 tablet (40 mg total) by mouth daily. Use this  prescription after done with twice daily dosing x 2 months 06/04/23   Faith Rogue, DO  prednisoLONE acetate (PRED FORTE) 1 % ophthalmic suspension Place 1 drop into both eyes 2 (two) times daily. 04/03/23   [provider]  predniSONE (DELTASONE) 10 MG tablet Take 6 tablets (60 mg total) by mouth daily with breakfast for 4 days. 06/09/23 06/13/23  Terald Sleeper, MD  timolol (TIMOPTIC) 0.5 % ophthalmic solution Place 1 drop into both eyes 2 (two) times daily. 05/14/23   [provider]  tiZANidine (ZANAFLEX) 4 MG tablet TAKE 1/2 TABLET (2MG  TOTAL) BY MOUTH EVERY 8 HOURS AS NEEDED FOR MUSCLE SPASM 05/12/23   Ivonne Andrew, NP  torsemide (DEMADEX) 20 MG tablet TAKE 2 TABLETS BY MOUTH IN THE MORNING AND AT BEDTIME 01/09/23   Massie Maroon, FNP  valACYclovir (VALTREX) 500 MG tablet Take 1 tablet (500 mg total) by mouth daily for 6 days. On dialysis  days, take dose AFTER dialysis 06/09/23 06/15/23  Terald Sleeper, MD                                                                                                                                    Past Surgical History Past Surgical History:  Procedure Laterality Date   A/V FISTULAGRAM Right 07/29/2020   Procedure: A/V FISTULAGRAM - Right Arm;  Surgeon: Chuck Hint, MD;  Location: [facility] INVASIVE CV LAB;  Service: Cardiovascular;  Laterality: Right;   BASCILIC VEIN TRANSPOSITION Right 04/12/2014   Procedure: BASILIC VEIN TRANSPOSITION;  Surgeon: Sherren Kerns, MD;  Location: [facility] OR;  Service: Vascular;  Laterality: Right;   BILIARY STENT PLACEMENT  04/04/2022   Procedure: BILIARY STENT PLACEMENT;  Surgeon: Meryl Dare, MD;  Location: [facility] ENDOSCOPY;  Service: Gastroenterology;;   BIOPSY  08/04/2020   Procedure: BIOPSY;  Surgeon: Meryl Dare, MD;  Location: [facility] ENDOSCOPY;  Service: Gastroenterology;;   BIOPSY  06/28/2021   Procedure: BIOPSY;  Surgeon: Beverley Fiedler, MD;  Location: [facility] ENDOSCOPY;  Service:  Gastroenterology;;   BIOPSY  04/02/2022   Procedure: BIOPSY;  Surgeon: Lemar Lofty., MD;  Location: [facility] ENDOSCOPY;  Service: Gastroenterology;;   BIOPSY  10/22/2022   Procedure: BIOPSY;  Surgeon: Beverley Fiedler, MD;  Location: [facility] ENDOSCOPY;  Service: Gastroenterology;;   BOWEL RESECTION N/A 05/24/2021   Procedure: SMALL BOWEL RESECTION;  Surgeon: Emelia Loron, MD;  Location: [facility] OR;  Service: General;  Laterality: N/A;   CARDIOVERSION N/A 06/05/2018   Procedure: CARDIOVERSION;  Surgeon: Dolores Patty, MD;  Location: [facility] ENDOSCOPY;  Service: Cardiovascular;  Laterality: N/A;   CARDIOVERSION N/A 10/06/2019   Procedure: CARDIOVERSION;  Surgeon: Dolores Patty, MD;  Location: [facility] ENDOSCOPY;  Service: Cardiovascular;  Laterality: N/A;   CHOLECYSTECTOMY N/A 05/24/2021   Procedure: OPEN CHOLECYSTECTOMY;  Surgeon: Emelia Loron, MD;  Location: [facility] OR;  Service: General;  Laterality: N/A;   COLONOSCOPY WITH PROPOFOL N/A 09/24/2017   Procedure: COLONOSCOPY WITH PROPOFOL;  Surgeon: Lynann Bologna, MD;  Location: [facility] ENDOSCOPY;  Service: Endoscopy;  Laterality: N/A;   ENDOSCOPIC RETROGRADE CHOLANGIOPANCREATOGRAPHY (ERCP) WITH PROPOFOL N/A 05/17/2022   Procedure: ENDOSCOPIC RETROGRADE CHOLANGIOPANCREATOGRAPHY (ERCP) WITH PROPOFOL;  Surgeon: Meridee Score Netty Starring., MD;  Location: [facility] ENDOSCOPY;  Service: Gastroenterology;  Laterality: N/A;   ERCP N/A 04/04/2022   Procedure: ENDOSCOPIC RETROGRADE CHOLANGIOPANCREATOGRAPHY (ERCP);  Surgeon: Meryl Dare, MD;  Location: [facility] ENDOSCOPY;  Service: Gastroenterology;  Laterality: N/A;   ESOPHAGOGASTRODUODENOSCOPY N/A 09/19/2017   Procedure: ESOPHAGOGASTRODUODENOSCOPY (EGD);  Surgeon: Lynann Bologna, MD;  Location: [facility] ENDOSCOPY;  Service: Endoscopy;  Laterality: N/A;   ESOPHAGOGASTRODUODENOSCOPY N/A 05/29/2021   Procedure: ESOPHAGOGASTRODUODENOSCOPY (EGD);  Surgeon: Rachael Fee, MD;  Location: [facility] ENDOSCOPY;  Service: Gastroenterology;   Laterality: N/A;   ESOPHAGOGASTRODUODENOSCOPY (EGD) WITH PROPOFOL N/A 08/04/2020   Procedure: ESOPHAGOGASTRODUODENOSCOPY (EGD) WITH PROPOFOL;  Surgeon: Meryl Dare, MD;  Location: [facility] ENDOSCOPY;  Service: Gastroenterology;  Laterality: N/A;   ESOPHAGOGASTRODUODENOSCOPY (EGD) WITH PROPOFOL N/A 06/28/2021   Procedure: ESOPHAGOGASTRODUODENOSCOPY (EGD) WITH PROPOFOL;  Surgeon: Beverley Fiedler, MD;  Location: [facility] ENDOSCOPY;  Service: Gastroenterology;  Laterality: N/A;   ESOPHAGOGASTRODUODENOSCOPY (EGD) WITH PROPOFOL N/A 08/10/2021   Procedure: ESOPHAGOGASTRODUODENOSCOPY (EGD) WITH PROPOFOL;  Surgeon: Lynann Bologna, MD;  Location: [facility] ENDOSCOPY;  Service: Gastroenterology;  Laterality: N/A;   ESOPHAGOGASTRODUODENOSCOPY (EGD) WITH PROPOFOL N/A 08/02/2021   Procedure: ESOPHAGOGASTRODUODENOSCOPY (EGD) WITH PROPOFOL;  Surgeon: Shellia Cleverly, DO;  Location: [facility] ENDOSCOPY;  Service: Gastroenterology;  Laterality: N/A;   ESOPHAGOGASTRODUODENOSCOPY (EGD) WITH PROPOFOL N/A 04/02/2022   Procedure: ESOPHAGOGASTRODUODENOSCOPY (EGD) WITH PROPOFOL;  Surgeon: Meridee Score Netty Starring., MD;  Location: [facility] ENDOSCOPY;  Service: Gastroenterology;  Laterality: N/A;   ESOPHAGOGASTRODUODENOSCOPY (EGD) WITH PROPOFOL N/A 05/17/2022   Procedure: ESOPHAGOGASTRODUODENOSCOPY (EGD) WITH PROPOFOL;  Surgeon: Meridee Score Netty Starring., MD;  Location: [facility] ENDOSCOPY;  Service: Gastroenterology;  Laterality: N/A;   ESOPHAGOGASTRODUODENOSCOPY (EGD) WITH PROPOFOL N/A 10/22/2022   Procedure: ESOPHAGOGASTRODUODENOSCOPY (EGD) WITH PROPOFOL;  Surgeon: Beverley Fiedler, MD;  Location: [facility] ENDOSCOPY;  Service: Gastroenterology;  Laterality: N/A;   EUS N/A 05/17/2022   Procedure: UPPER ENDOSCOPIC ULTRASOUND (EUS) RADIAL;  Surgeon: Lemar Lofty., MD;  Location: [facility] ENDOSCOPY;  Service: Gastroenterology;  Laterality: N/A;   FINE NEEDLE ASPIRATION  04/02/2022   Procedure: FINE NEEDLE ASPIRATION (FNA) LINEAR;  Surgeon: Lemar Lofty., MD;   Location: [facility] ENDOSCOPY;  Service: Gastroenterology;;   FINE NEEDLE ASPIRATION N/A 05/17/2022   Procedure: FINE NEEDLE ASPIRATION (FNA) LINEAR;  Surgeon: Lemar Lofty., MD;  Location: [facility] ENDOSCOPY;  Service: Gastroenterology;  Laterality: N/A;   HEMOSTASIS CLIP PLACEMENT  05/29/2021   Procedure: HEMOSTASIS CLIP PLACEMENT;  Surgeon: Rachael Fee, MD;  Location: [facility] ENDOSCOPY;  Service: Gastroenterology;;   HOT HEMOSTASIS N/A 05/29/2021   Procedure: HOT HEMOSTASIS (ARGON PLASMA COAGULATION/BICAP);  Surgeon: Rachael Fee, MD;  Location: [facility] ENDOSCOPY;  Service: Gastroenterology;  Laterality: N/A;   HOT HEMOSTASIS N/A 06/28/2021   Procedure: HOT HEMOSTASIS (ARGON PLASMA COAGULATION/BICAP);  Surgeon: Beverley Fiedler, MD;  Location: [facility] ENDOSCOPY;  Service: Gastroenterology;  Laterality: N/A;   HOT HEMOSTASIS N/A 08/10/2021   Procedure: HOT HEMOSTASIS (ARGON PLASMA COAGULATION/BICAP);  Surgeon: Lynann Bologna, MD;  Location: [facility] ENDOSCOPY;  Service: Gastroenterology;  Laterality: N/A;   HOT HEMOSTASIS N/A 08/02/2021   Procedure: HOT HEMOSTASIS (ARGON PLASMA COAGULATION/BICAP);  Surgeon: Shellia Cleverly, DO;  Location: [facility] ENDOSCOPY;  Service: Gastroenterology;  Laterality: N/A;   IR PARACENTESIS  07/04/2022   IR PERC TUN PERIT CATH WO PORT S&I /IMAG  05/23/2021   IR REMOVAL TUN CV CATH W/O FL  06/10/2021   IR US GUIDE VASC ACCESS RIGHT  05/23/2021   LAPAROSCOPY N/A 05/24/2021   Procedure: LAPAROSCOPY DIAGNOSTIC;  Surgeon: Emelia Loron, MD;  Location: [facility] OR;  Service: General;  Laterality: N/A;   LAPAROTOMY N/A 05/24/2021   Procedure: EXPLORATORY LAPAROTOMY;  Surgeon: Emelia Loron, MD;  Location: [facility] OR;  Service: General;  Laterality: N/A;   LEFT AND RIGHT HEART CATHETERIZATION WITH CORONARY ANGIOGRAM N/A 06/11/2011   Procedure: LEFT AND RIGHT HEART CATHETERIZATION WITH CORONARY ANGIOGRAM;  Surgeon: Laurey Morale, MD;  Location: [facility] CATH LAB;  Service: Cardiovascular;   Laterality: N/A;   PERIPHERAL VASCULAR BALLOON ANGIOPLASTY Right 07/29/2020   Procedure: PERIPHERAL VASCULAR BALLOON ANGIOPLASTY;  Surgeon: Chuck Hint, MD;  Location: [facility] INVASIVE CV LAB;  Service: Cardiovascular;  Laterality: Right;  arm fistula   REMOVAL OF STONES  05/17/2022  Procedure: REMOVAL OF STONES;  Surgeon: Mansouraty, Netty Starring., MD;  Location: [facility] ENDOSCOPY;  Service: Gastroenterology;;   REVISON OF ARTERIOVENOUS FISTULA Right 08/08/2020   Procedure: ANEURYSM EXCISION OF RIGHT UPPER EXTREMITY ARTERIOVENOUS FISTULA;  Surgeon: Chuck Hint, MD;  Location: [facility] OR;  Service: Vascular;  Laterality: Right;   REVISON OF ARTERIOVENOUS FISTULA Right 03/21/2022   Procedure: EXCISION OF ULCERATED SKIN OF RIGHT ARTERIOVENOUS FISTULA REVISION;  Surgeon: Cephus Shelling, MD;  Location: [facility] OR;  Service: Vascular;  Laterality: Right;   REVISON OF ARTERIOVENOUS FISTULA Right 07/02/2022   Procedure: EXCISION OF ULCERS RIGHT ARTERIOVENOUS FISTULA;  Surgeon: Chuck Hint, MD;  Location: [facility] OR;  Service: Vascular;  Laterality: Right;   SCLEROTHERAPY  05/29/2021   Procedure: Susa Day;  Surgeon: Rachael Fee, MD;  Location: [facility] ENDOSCOPY;  Service: Gastroenterology;;   Dennison Mascot  04/04/2022   Procedure: SPHINCTEROTOMY;  Surgeon: Meryl Dare, MD;  Location: [facility] ENDOSCOPY;  Service: Gastroenterology;;   Francine Graven REMOVAL  05/29/2021   Procedure: STENT REMOVAL;  Surgeon: Rachael Fee, MD;  Location: [facility] ENDOSCOPY;  Service: Gastroenterology;;   Francine Graven REMOVAL  05/17/2022   Procedure: STENT REMOVAL;  Surgeon: Lemar Lofty., MD;  Location: [facility] ENDOSCOPY;  Service: Gastroenterology;;   TEE WITHOUT CARDIOVERSION N/A 12/27/2015   Procedure: TRANSESOPHAGEAL ECHOCARDIOGRAM (TEE);  Surgeon: Quintella Reichert, MD;  Location: [facility] ENDOSCOPY;  Service: Cardiovascular;  Laterality: N/A;   UPPER ESOPHAGEAL ENDOSCOPIC ULTRASOUND (EUS) N/A 04/02/2022   Procedure: UPPER  ESOPHAGEAL ENDOSCOPIC ULTRASOUND (EUS);  Surgeon: Lemar Lofty., MD;  Location: [facility] ENDOSCOPY;  Service: Gastroenterology;  Laterality: N/A;   Family History Family History  Problem Relation Age of Onset   Alcohol abuse Mother    Liver disease Mother    Cirrhosis Mother    Heart attack Mother     Social History Social History   Tobacco Use   Smoking status: Former    Current packs/day: 0.00    Average packs/day: 0.5 packs/day for 41.0 years (20.5 ttl pk-yrs)    Types: Cigarettes    Start date: 12/08/1974    Quit date: 12/08/2015    Years since quitting: 7.5   Smokeless tobacco: Never  Vaping Use   Vaping status: Never Used  Substance Use Topics   Alcohol use: Not Currently    Alcohol/week: 3.0 standard drinks of alcohol    Types: 1 Glasses of wine, 2 Cans of beer per week    Comment: none since starting dialysis 06/2021   Drug use: No   Allergies Patient has no known allergies.  Review of Systems Review of Systems  Physical Exam Vital Signs  I have reviewed the triage vital signs BP 116/70   Pulse 66   Temp 98.3 F (36.8 C)   Resp 20   Ht 5\' 11"  (1.803 m)   Wt 65.3 kg   SpO2 96%   BMI 20.08 kg/m   Physical Exam HENT:     Head: Normocephalic.  Pulmonary:     Effort: Pulmonary effort is normal. No respiratory distress.     Breath sounds: No wheezing or rales.  Musculoskeletal:        General: No deformity. Normal range of motion.  Skin:    General: Skin is warm and dry.  Neurological:     Mental Status: He is alert.     Comments: Right-sided facial droop     ED Results and Treatments Labs (all labs ordered are listed, but only abnormal results are displayed) Labs Reviewed  COMPREHENSIVE METABOLIC PANEL - Abnormal; Notable for the following components:      Result Value   Potassium 3.3 (*)    Chloride 92 (*)    Creatinine, Ser 4.33 (*)    Alkaline Phosphatase 153 (*)    Total Bilirubin 2.8 (*)    GFR, Estimated 14 (*)    All other  components within normal limits  CBC WITH DIFFERENTIAL/PLATELET - Abnormal; Notable for the following components:   RBC 1.81 (*)    Hemoglobin 7.1 (*)    HCT 19.2 (*)    MCV 106.1 (*)    MCH 39.2 (*)    MCHC 37.0 (*)    RDW 19.4 (*)    Platelets 141 (*)    nRBC 43.0 (*)    Monocytes Absolute 1.5 (*)    Abs Immature Granulocytes 0.12 (*)    All other components within normal limits  RETICULOCYTES - Abnormal; Notable for the following components:   Retic Ct Pct 6.8 (*)    RBC. 1.80 (*)    Immature Retic Fract 28.3 (*)    All other components within normal limits  TROPONIN I (HIGH SENSITIVITY) - Abnormal; Notable for the following components:   Troponin I (High Sensitivity) 29 (*)    All other components within normal limits  RESP PANEL BY RT-PCR (RSV, FLU A&B, COVID)  RVPGX2                                                                                                                          Radiology DG Chest 2 View Result Date: 06/13/2023 CLINICAL DATA:  Shortness of breath.  Sickle cell pain crisis. EXAM: CHEST - 2 VIEW COMPARISON:  Chest radiograph dated 11/20/2022 FINDINGS: Normal lung volumes. Increased diffuse interstitial opacities and pulmonary vascular prominence. Trace bilateral pleural effusions. No pneumothorax. Similar enlarged cardiomediastinal silhouette. No acute osseous abnormality. IMPRESSION: 1. Increased diffuse interstitial opacities and pulmonary vascular prominence, likely pulmonary edema. 2. Trace bilateral pleural effusions. 3. Similar cardiomegaly. Electronically Signed   By: Agustin Cree M.D.   On: 06/13/2023 16:45    Pertinent labs & imaging results that were available during my care of the patient were reviewed by me and considered in my medical decision making (see MDM for details).  Medications Ordered in ED Medications  HYDROmorphone (DILAUDID) injection 1 mg (1 mg Subcutaneous Given 06/13/23 1628)  HYDROmorphone (DILAUDID) injection 1 mg (1 mg  Intravenous Given 06/13/23 1650)  diphenhydrAMINE (BENADRYL) capsule 25 mg (25 mg Oral Given 06/13/23 1649)  acetaminophen (TYLENOL) tablet 1,000 mg (1,000 mg Oral Given 06/13/23 1649)  HYDROmorphone (DILAUDID) injection 1 mg (1 mg Intravenous Given 06/13/23 1808)  Procedures Procedures  (including critical care time)  Medical Decision Making / ED Course   This patient presents to the ED for concern of underlying pain, this involves an extensive number of treatment options, and is a complaint that carries with it a high risk of complications and morbidity.  The differential diagnosis includes sickle cell pain crisis, less likely acute limb ischemia, less likely acute chest.  MDM: On exam, patient with warm and well-perfused arms and hands, legs and feet.  Did complete full dialysis today.  My dependent review the patient chest x-ray shows no pneumonia.  Low suspicion for acute chest.  Will provide the patient with analgesia for sickle cell pain crisis.  Patient has not had frequent ED visits for sickle cell pain.  Viral swab ordered.  Reassessment 7:10 PM-patient's hemoglobin is at his baseline.  Chest x-ray does not show any evidence of pneumonia.  Symptoms not consistent with acute chest.  Patient states his symptoms are quite improved.  He would like to try to go home.  He is going to call his hematologist.  Will send some additional short course of oxycodone for the patient.  Additional history obtained: -Additional history obtained from  -External records from outside source obtained and reviewed including: Chart review including previous notes, labs, imaging, consultation notes   Lab Tests: -I ordered, reviewed, and interpreted labs.   The pertinent results include:   Labs Reviewed  COMPREHENSIVE METABOLIC PANEL - Abnormal; Notable for the following  components:      Result Value   Potassium 3.3 (*)    Chloride 92 (*)    Creatinine, Ser 4.33 (*)    Alkaline Phosphatase 153 (*)    Total Bilirubin 2.8 (*)    GFR, Estimated 14 (*)    All other components within normal limits  CBC WITH DIFFERENTIAL/PLATELET - Abnormal; Notable for the following components:   RBC 1.81 (*)    Hemoglobin 7.1 (*)    HCT 19.2 (*)    MCV 106.1 (*)    MCH 39.2 (*)    MCHC 37.0 (*)    RDW 19.4 (*)    Platelets 141 (*)    nRBC 43.0 (*)    Monocytes Absolute 1.5 (*)    Abs Immature Granulocytes 0.12 (*)    All other components within normal limits  RETICULOCYTES - Abnormal; Notable for the following components:   Retic Ct Pct 6.8 (*)    RBC. 1.80 (*)    Immature Retic Fract 28.3 (*)    All other components within normal limits  TROPONIN I (HIGH SENSITIVITY) - Abnormal; Notable for the following components:   Troponin I (High Sensitivity) 29 (*)    All other components within normal limits  RESP PANEL BY RT-PCR (RSV, FLU A&B, COVID)  [id number]      EKG   EKG Interpretation Date/Time:    Ventricular Rate:    PR Interval:    QRS Duration:    QT Interval:    QTC Calculation:   R Axis:      Text Interpretation:           Imaging Studies ordered: I ordered imaging studies including chest x-ray I independently visualized and interpreted imaging. I agree with the radiologist interpretation   Medicines ordered and prescription drug management: Meds ordered this encounter  Medications   HYDROmorphone (DILAUDID) injection 1 mg   HYDROmorphone (DILAUDID) injection 1 mg   diphenhydrAMINE (BENADRYL) capsule 25 mg   acetaminophen (TYLENOL) tablet 1,000 mg  HYDROmorphone (DILAUDID) injection 1 mg   oxyCODONE (ROXICODONE) 5 MG immediate release tablet    Sig: Take 2 tablets (10 mg total) by mouth every 6 (six) hours as needed for severe pain (pain score 7-10) or breakthrough pain.    Dispense:  12 tablet    Refill:  0    -I have reviewed the  patients home medicines and have made adjustments as needed   Cardiac Monitoring: The patient was maintained on a cardiac monitor.  I personally viewed and interpreted the cardiac monitored which showed an underlying rhythm of: Sinus rhythm  Social Determinants of Health:  Factors impacting patients care include: Multiple medical comorbidities including end-stage renal disease, sickle cell disease   Reevaluation: After the interventions noted above, I reevaluated the patient and found that they have :improved  Co morbidities that complicate the patient evaluation  Past Medical History:  Diagnosis Date   A-fib (HCC)    Blood dyscrasia    Blood transfusion    "I've had a bunch of them"   CHF (congestive heart failure) (HCC)    followed by hf clinic   Chronic kidney disease    Stage 5, dialysis on Tu/Th/Sa   Dialysis patient (HCC) 04/2019   AV fistula (right arm), TTS   Dysrhythmia    A-Fib per patient   Early satiety    Elevated LFTs 04/01/2022   Elevated troponin 04/01/2022   Empyema of gallbladder 05/27/2021   GERD (gastroesophageal reflux disease)    Gout    Legally blind    Metabolic acidosis with increased anion gap and accumulation of organic acids 04/01/2022   Mitral regurgitation    Muscle tightness-Right arm  05/27/2014   Pneumonia    Pulmonary hypertension (HCC)    Shortness of breath dyspnea    Sickle cell disease, type SS (HCC)    Swelling abdomen    Swelling of extremity, left    Swelling of extremity, right    Tricuspid regurgitation       Dispostion: I considered admission for this patient, however with his improvement believe he is appropriate for outpatient management.     Final Clinical Impression(s) / ED Diagnoses Final diagnoses:  Sickle cell pain crisis (HCC)     @PCDICTATION @    Anders Simmonds T, DO 06/13/23 1915

## 2023-06-13 NOTE — ED Provider Triage Note (Cosign Needed Addendum)
Emergency Medicine Provider Triage Evaluation Note  Patrick [name] , a 67 y.o. male  was evaluated in triage.  Pt complains of SOB and sickle cell pain crisis.  Review of Systems  Positive:  Negative:   Physical Exam  BP 107/63 (BP Location: Left Arm)   Pulse 78   Temp 98.3 F (36.8 C)   Resp 16   Ht 5\' 11"  (1.803 m)   Wt 65.3 kg   SpO2 96%   BMI 20.08 kg/m  Gen:   Awake, no distress   Resp:  Normal effort  MSK:   Moves extremities without difficulty  Other:    Medical Decision Making  Medically screening exam initiated at 3:09 PM.  Appropriate orders placed.  Patrick [name] was informed that the remainder of the evaluation will be completed by another provider, this initial triage assessment does not replace that evaluation, and the importance of remaining in the ED until their evaluation is complete.  Sickle cell pain crisis. Complaining of SOB, mild chest pain, arm/leg/lower back pain. Medicines at home have not been helping. States that it hurts to take deep breath in - leading to SOB and chest pain.   Denies fever,  cough, nausea, vomiting, diarrhea, dysuria, hematuria, hematochezia.       [name], [place] 06/13/23 1513

## 2023-06-13 NOTE — ED Triage Notes (Signed)
Patient reports pain to arms, legs and back that feels like sickle cell pain crisis. Patient also recently diagnosed with Bells Palsy.

## 2023-06-14 ENCOUNTER — Ambulatory Visit: Payer: Self-pay | Admitting: Internal Medicine

## 2023-06-14 NOTE — Telephone Encounter (Signed)
  Chief Complaint: Sickle Cell Crisis pain Symptoms: Pain 9/10 in arms and legs Frequency: constant throbbing Disposition: [x] ED /[] Urgent Care (no appt availability in office) / [] Appointment(In office/virtual)/ []  [place] [facility]/ [] Home Care/ [] Refused Recommended Disposition /[] [place] [facility]/ []  Follow-up with PCP Additional Notes: Patient called in stating he is having a sickle cell pain crisis. States he was seen in ED yesterday and given Fentanyl and sent home with two prescriptions for Oxycontin. States he is not having any pain relief. Provider doesn't have any availability for open appointments. Advised patient to return to ED for symptom management and pain control.    Copied from CRM [id number]. Topic: Clinical - Red Word Triage >> Jun 14, 2023  3:14 PM Hector Shade B wrote: Red Word that prompted transfer to Nurse Triage: sickle cell crisis was at hospital yesterday, gave him 2 shots of fentanyl, and gave two prescriptions for OxyContin. Picked up prescriptions that were given from the hosptial but its not helping at this Reason for Disposition  [1] SEVERE abdominal pain AND [2] present > 1 hour  Answer Assessment - Initial Assessment Questions 1. ONSET: "When did the pain begin?"     Tuesday 2. LOCATION: "Where does it hurt?"      Legs and arms 3. SEVERITY: "How bad is the pain?"  (e.g., Scale 1-10; mild, moderate, or severe)   - MILD (1-3): doesn't interfere with normal activities    - MODERATE (4-7): interferes with normal activities or awakens from sleep    - SEVERE (8-10): excruciating pain, unable to do any normal activities      9 4. PATTERN: "Is the pain constant?" (e.g., yes, no; constant, intermittent)      Pounding pain, constant, throbbing 5. CAUSE:  What do you think is causing the pain? Is this pain similar to the acute pain attacks you have had in the past? (e.g., similar location, quality, intensity)     Yes 6. PAIN MEDICINES:    - "Do you have a  plan from your doctor for treating your acute pain episodes?" (e.g., pain management plan)   - "What have you taken so far for the pain?" (e.g., nothing, acetaminophen, NSAIDS, narcotic pain medicine).    - "How much has this helped relieve the pain?"    - "Have you done anything else to help the pain?" (e.g., heating pad)     Two prescriptions of oxycontin 7. OTHER SYMPTOMS: "Do you have any other symptoms?" (e.g., fever, chest pain, shortness of breath, new areas of swelling or redness)     No  Protocols used: Sickle Cell Disease - Acute Pain Episode (Crisis)-A-AH

## 2023-06-17 ENCOUNTER — Ambulatory Visit: Payer: PPO | Admitting: Physical Therapy

## 2023-06-18 DIAGNOSIS — N2581 Secondary hyperparathyroidism of renal origin: Secondary | ICD-10-CM | POA: Diagnosis not present

## 2023-06-18 DIAGNOSIS — D571 Sickle-cell disease without crisis: Secondary | ICD-10-CM | POA: Diagnosis not present

## 2023-06-18 DIAGNOSIS — D57219 Sickle-cell/Hb-C disease with crisis, unspecified: Secondary | ICD-10-CM | POA: Diagnosis not present

## 2023-06-18 DIAGNOSIS — I158 Other secondary hypertension: Secondary | ICD-10-CM | POA: Diagnosis not present

## 2023-06-18 DIAGNOSIS — D57819 Other sickle-cell disorders with crisis, unspecified: Secondary | ICD-10-CM | POA: Diagnosis not present

## 2023-06-18 DIAGNOSIS — D5700 Hb-SS disease with crisis, unspecified: Secondary | ICD-10-CM | POA: Diagnosis not present

## 2023-06-18 DIAGNOSIS — N186 End stage renal disease: Secondary | ICD-10-CM | POA: Diagnosis not present

## 2023-06-18 DIAGNOSIS — Z992 Dependence on renal dialysis: Secondary | ICD-10-CM | POA: Diagnosis not present

## 2023-06-18 DIAGNOSIS — L299 Pruritus, unspecified: Secondary | ICD-10-CM | POA: Diagnosis not present

## 2023-06-20 ENCOUNTER — Telehealth: Payer: Self-pay

## 2023-06-20 ENCOUNTER — Other Ambulatory Visit: Payer: Self-pay | Admitting: Family Medicine

## 2023-06-20 DIAGNOSIS — D57219 Sickle-cell/Hb-C disease with crisis, unspecified: Secondary | ICD-10-CM | POA: Diagnosis not present

## 2023-06-20 DIAGNOSIS — N2581 Secondary hyperparathyroidism of renal origin: Secondary | ICD-10-CM | POA: Diagnosis not present

## 2023-06-20 DIAGNOSIS — D5700 Hb-SS disease with crisis, unspecified: Secondary | ICD-10-CM | POA: Diagnosis not present

## 2023-06-20 DIAGNOSIS — D631 Anemia in chronic kidney disease: Secondary | ICD-10-CM | POA: Diagnosis not present

## 2023-06-20 DIAGNOSIS — Z992 Dependence on renal dialysis: Secondary | ICD-10-CM | POA: Diagnosis not present

## 2023-06-20 DIAGNOSIS — D57819 Other sickle-cell disorders with crisis, unspecified: Secondary | ICD-10-CM | POA: Diagnosis not present

## 2023-06-20 DIAGNOSIS — N186 End stage renal disease: Secondary | ICD-10-CM | POA: Diagnosis not present

## 2023-06-20 DIAGNOSIS — D571 Sickle-cell disease without crisis: Secondary | ICD-10-CM | POA: Diagnosis not present

## 2023-06-20 DIAGNOSIS — L299 Pruritus, unspecified: Secondary | ICD-10-CM | POA: Diagnosis not present

## 2023-06-20 NOTE — Transitions of Care (Post Inpatient/ED Visit) (Signed)
   06/20/2023  Name: Patrick [name] MRN: [id number] DOB: 1955-11-03  Today's TOC FU Call Status: Today's TOC FU Call Status:: Unsuccessful Call (3rd Attempt) Unsuccessful Call (3rd Attempt) Date: 06/20/23  Attempted to reach the patient regarding the most recent Inpatient/ED visit.  Follow Up Plan: No further outreach attempts will be made at this time. We have been unable to contact the patient.  Alan Ee, RN, BSN, CEN [employer]- Transition of Care Team.  [facility] [phone]

## 2023-06-21 ENCOUNTER — Ambulatory Visit (INDEPENDENT_AMBULATORY_CARE_PROVIDER_SITE_OTHER): Payer: PPO | Admitting: Nurse Practitioner

## 2023-06-21 ENCOUNTER — Encounter: Payer: Self-pay | Admitting: Nurse Practitioner

## 2023-06-21 VITALS — BP 107/59 | HR 90 | Temp 97.2°F | Wt 148.4 lb

## 2023-06-21 DIAGNOSIS — G510 Bell's palsy: Secondary | ICD-10-CM

## 2023-06-21 MED ORDER — GABAPENTIN 100 MG PO CAPS: 100.0000 mg | ORAL_CAPSULE | Freq: Three times a day (TID) | ORAL | 3 refills | Status: DC

## 2023-06-21 NOTE — Patient Instructions (Signed)
 1. Bell's palsy (Primary)  - gabapentin (NEURONTIN) 100 MG capsule; Take 1 capsule (100 mg total) by mouth 3 (three) times daily.  Dispense: 90 capsule; Refill: 3 - Ambulatory referral to Neurology - CBC - Comprehensive metabolic panel

## 2023-06-21 NOTE — Progress Notes (Signed)
 Subjective   Patient ID: Patrick [name], male    DOB: [birth year], 68 y.o.   MRN: [id number]  Chief Complaint  Patient presents with   hosptial follow up    Referring provider: Jegede, Olugbemiga E, MD  Patrick [name] is a 68 y.o. male with Past Medical History: No date: A-fib [facility]) No date: Blood dyscrasia No date: Blood transfusion     Comment:  I've had a bunch of them No date: CHF (congestive heart failure) (HCC)     Comment:  followed by hf clinic No date: Chronic kidney disease     Comment:  Stage 5, dialysis on Tu/Th/Sa 04/2019: Dialysis patient [facility])     Comment:  AV fistula (right arm), TTS No date: Dysrhythmia     Comment:  A-Fib per patient No date: Early satiety 04/01/2022: Elevated LFTs 04/01/2022: Elevated troponin 05/27/2021: Empyema of gallbladder No date: GERD (gastroesophageal reflux disease) No date: Gout No date: Legally blind 04/01/2022: Metabolic acidosis with increased anion gap and  accumulation of organic acids No date: Mitral regurgitation 05/27/2014: Muscle tightness-Right arm  No date: Pneumonia No date: Pulmonary hypertension (HCC) No date: Shortness of breath dyspnea No date: Sickle cell disease, type SS (HCC) No date: Swelling abdomen No date: Swelling of extremity, left No date: Swelling of extremity, right No date: Tricuspid regurgitation   HPI  Patient presents today for hospital follow-up.  He was recently seen in the ED and diagnosed with Bell's palsy.  He did have MRI performed.  He does need a referral to neurology.  He was treated with steroids but is still having right sided facial droop.  He states that it is hard to swallow at times.  We will check labs today.  We will trial Neurontin . Denies f/c/s, n/v/d, hemoptysis, PND, leg swelling Denies chest pain or edema     No Known Allergies  Immunization History  Administered Date(s) Administered   Influenza Inj Mdck Quad Pf 03/21/2018   Influenza, Mdck,  Trivalent,PF 6+ MOS(egg free) 04/02/2023   Influenza, Quadrivalent, Recombinant, Inj, Pf 03/27/2022   Influenza,inj,Quad PF,6+ Mos 03/17/2013, 03/08/2014   PFIZER(Purple Top)SARS-COV-2 Vaccination 10/01/2019, 10/26/2019, 03/03/2020   Pneumococcal Conjugate-13 12/02/2014   Pneumococcal Polysaccharide-23 02/10/2013, 05/15/2019, 02/28/2021   Tdap 09/24/2013   Zoster Recombinant(Shingrix) 11/03/2020, 01/11/2021    Tobacco History: Social History   Tobacco Use  Smoking Status Former   Current packs/day: 0.00   Average packs/day: 0.5 packs/day for 41.0 years (20.5 ttl pk-yrs)   Types: Cigarettes   Start date: 12/08/1974   Quit date: 12/08/2015   Years since quitting: 7.5  Smokeless Tobacco Never   Counseling given: Not Answered   Outpatient Encounter Medications as of 06/21/2023  Medication Sig   acetaminophen  (TYLENOL ) 500 MG tablet Take 1,500 mg by mouth 3 (three) times daily as needed for mild pain, moderate pain, fever or headache.   allopurinol  (ZYLOPRIM ) 100 MG tablet Take 1 tablet (100 mg total) by mouth Every Tuesday,Thursday,and Saturday with dialysis.   AURYXIA  1 GM 210 MG(Fe) tablet Take 210-420 mg by mouth See admin instructions. 420 mg with every meal, 210 mg with each snack.   cinacalcet  (SENSIPAR ) 30 MG tablet Take 1 tablet (30 mg total) by mouth Every Tuesday,Thursday,and Saturday with dialysis.   diphenhydramine -acetaminophen  (TYLENOL  PM) 25-500 MG TABS tablet Take 2 tablets by mouth at bedtime as needed (sleep/pain).   folic acid  (FOLVITE ) 1 MG tablet TAKE 1 TABLET BY MOUTH EVERY DAY   gabapentin  (NEURONTIN ) 100 MG capsule Take 1  capsule (100 mg total) by mouth 3 (three) times daily.   hydroxyurea  (HYDREA ) 500 MG capsule Take 1 capsule (500 mg total) by mouth daily.   lidocaine -prilocaine  (EMLA ) cream Apply 1 application. topically Every Tuesday,Thursday,and Saturday with dialysis.   midodrine  (PROAMATINE ) 10 MG tablet Take 1 tablet (10 mg total) by mouth 3 (three) times  daily with meals.   ofloxacin (OCUFLOX) 0.3 % ophthalmic solution Place 1 drop into both eyes 2 (two) times daily.   oxyCODONE  (OXYCONTIN ) 10 mg 12 hr tablet Take 1 tablet (10 mg total) by mouth every 12 (twelve) hours.   oxyCODONE  (ROXICODONE ) 5 MG immediate release tablet Take 2 tablets (10 mg total) by mouth every 6 (six) hours as needed for severe pain (pain score 7-10) or breakthrough pain.   pantoprazole  (PROTONIX ) 40 MG tablet Take 1 tablet (40 mg total) by mouth daily. Use this prescription after done with twice daily dosing x 2 months   prednisoLONE acetate (PRED FORTE) 1 % ophthalmic suspension Place 1 drop into both eyes 2 (two) times daily.   tiZANidine  (ZANAFLEX ) 4 MG tablet TAKE 1/2 TABLET (2MG  TOTAL) BY MOUTH EVERY 8 HOURS AS NEEDED FOR MUSCLE SPASM   camphor-menthol  (SARNA) lotion Apply 1 Application topically as needed for itching. (Patient not taking: Reported on 06/21/2023)   lactulose  (CHRONULAC ) 10 GM/15ML solution Take 30 mLs (20 g total) by mouth 2 (two) times daily as needed for mild constipation. (Patient not taking: Reported on 06/01/2023)   Oxycodone  HCl 10 MG TABS Take 1 tablet (10 mg total) by mouth every 6 (six) hours as needed. (Patient not taking: Reported on 06/01/2023)   timolol (TIMOPTIC) 0.5 % ophthalmic solution Place 1 drop into both eyes 2 (two) times daily. (Patient not taking: Reported on 06/21/2023)   [Paused] torsemide  (DEMADEX ) 20 MG tablet TAKE 2 TABLETS BY MOUTH IN THE MORNING AND AT BEDTIME   No facility-administered encounter medications on file as of 06/21/2023.    Review of Systems  Review of Systems  Constitutional: Negative.   HENT: Negative.         Right side facial droop  Cardiovascular: Negative.   Gastrointestinal: Negative.   Allergic/Immunologic: Negative.   Neurological: Negative.   Psychiatric/Behavioral: Negative.       Objective:   BP (!) 107/59   Pulse 90   Temp (!) 97.2 F (36.2 C)   Wt 148 lb 6.4 oz (67.3 kg)   SpO2 100%    BMI 20.70 kg/m   Wt Readings from Last 5 Encounters:  06/21/23 148 lb 6.4 oz (67.3 kg)  06/13/23 144 lb (65.3 kg)  06/08/23 144 lb (65.3 kg)  06/04/23 153 lb 3.5 oz (69.5 kg)  04/23/23 143 lb (64.9 kg)     Physical Exam Vitals and nursing note reviewed.  Constitutional:      General: He is not in acute distress.    Appearance: He is well-developed.  HENT:     Head:     Comments: Right side facial droop Cardiovascular:     Rate and Rhythm: Normal rate and regular rhythm.  Pulmonary:     Effort: Pulmonary effort is normal.     Breath sounds: Normal breath sounds.  Skin:    General: Skin is warm and dry.  Neurological:     Mental Status: He is alert and oriented to person, place, and time.       Assessment & Plan:   Bell's palsy -     Gabapentin ; Take 1 capsule (100 mg  total) by mouth 3 (three) times daily.  Dispense: 90 capsule; Refill: 3 -     Ambulatory referral to Neurology -     CBC -     Comprehensive metabolic panel     Return in about 3 months (around 09/19/2023).   Bascom GORMAN Borer, NP 06/21/2023

## 2023-06-22 LAB — COMPREHENSIVE METABOLIC PANEL
ALT: 11 [IU]/L (ref 0–44)
AST: 30 [IU]/L (ref 0–40)
Albumin: 4.1 g/dL (ref 3.9–4.9)
Alkaline Phosphatase: 180 [IU]/L — ABNORMAL HIGH (ref 44–121)
BUN/Creatinine Ratio: 4 — ABNORMAL LOW (ref 10–24)
BUN: 21 mg/dL (ref 8–27)
Bilirubin Total: 2.9 mg/dL — ABNORMAL HIGH (ref 0.0–1.2)
CO2: 26 mmol/L (ref 20–29)
Calcium: 8.3 mg/dL — ABNORMAL LOW (ref 8.6–10.2)
Chloride: 93 mmol/L — ABNORMAL LOW (ref 96–106)
Creatinine, Ser: 5.51 mg/dL — ABNORMAL HIGH (ref 0.76–1.27)
Globulin, Total: 2.9 g/dL (ref 1.5–4.5)
Glucose: 82 mg/dL (ref 70–99)
Potassium: 4.2 mmol/L (ref 3.5–5.2)
Sodium: 138 mmol/L (ref 134–144)
Total Protein: 7 g/dL (ref 6.0–8.5)
eGFR: 11 mL/min/{1.73_m2} — ABNORMAL LOW (ref >59)

## 2023-06-22 LAB — CBC
Hematocrit: 19.2 % — ABNORMAL LOW (ref 37.5–51.0)
Hemoglobin: 6.6 g/dL — CL (ref 13.0–17.7)
MCH: 39.5 pg — ABNORMAL HIGH (ref 26.6–33.0)
MCHC: 34.4 g/dL (ref 31.5–35.7)
MCV: 115 fL — ABNORMAL HIGH (ref 79–97)
NRBC: 1 % — ABNORMAL HIGH (ref 0–0)
Platelets: 114 10*3/uL — ABNORMAL LOW (ref 150–450)
RBC: 1.67 x10E6/uL — CL (ref 4.14–5.80)
RDW: 21.9 % — ABNORMAL HIGH (ref 11.6–15.4)
WBC: 7 10*3/uL (ref 3.4–10.8)

## 2023-06-24 ENCOUNTER — Ambulatory Visit: Payer: PPO | Attending: Physician Assistant | Admitting: Physical Therapy

## 2023-06-24 DIAGNOSIS — R2681 Unsteadiness on feet: Secondary | ICD-10-CM | POA: Insufficient documentation

## 2023-06-24 DIAGNOSIS — G8929 Other chronic pain: Secondary | ICD-10-CM | POA: Insufficient documentation

## 2023-06-24 DIAGNOSIS — M25561 Pain in right knee: Secondary | ICD-10-CM | POA: Insufficient documentation

## 2023-06-24 DIAGNOSIS — R29898 Other symptoms and signs involving the musculoskeletal system: Secondary | ICD-10-CM | POA: Insufficient documentation

## 2023-06-24 DIAGNOSIS — M6281 Muscle weakness (generalized): Secondary | ICD-10-CM | POA: Insufficient documentation

## 2023-06-25 ENCOUNTER — Ambulatory Visit: Payer: HMO | Admitting: Neurology

## 2023-06-25 ENCOUNTER — Encounter: Payer: Self-pay | Admitting: Neurology

## 2023-06-25 VITALS — BP 118/81 | HR 83 | Resp 15 | Ht 71.0 in

## 2023-06-25 DIAGNOSIS — D571 Sickle-cell disease without crisis: Secondary | ICD-10-CM | POA: Diagnosis not present

## 2023-06-25 DIAGNOSIS — N2581 Secondary hyperparathyroidism of renal origin: Secondary | ICD-10-CM | POA: Diagnosis not present

## 2023-06-25 DIAGNOSIS — N186 End stage renal disease: Secondary | ICD-10-CM | POA: Diagnosis not present

## 2023-06-25 DIAGNOSIS — D631 Anemia in chronic kidney disease: Secondary | ICD-10-CM | POA: Diagnosis not present

## 2023-06-25 DIAGNOSIS — G510 Bell's palsy: Secondary | ICD-10-CM | POA: Diagnosis not present

## 2023-06-25 DIAGNOSIS — H547 Unspecified visual loss: Secondary | ICD-10-CM | POA: Diagnosis not present

## 2023-06-25 DIAGNOSIS — L299 Pruritus, unspecified: Secondary | ICD-10-CM | POA: Diagnosis not present

## 2023-06-25 DIAGNOSIS — D5700 Hb-SS disease with crisis, unspecified: Secondary | ICD-10-CM | POA: Diagnosis not present

## 2023-06-25 DIAGNOSIS — D57819 Other sickle-cell disorders with crisis, unspecified: Secondary | ICD-10-CM | POA: Diagnosis not present

## 2023-06-25 DIAGNOSIS — Z992 Dependence on renal dialysis: Secondary | ICD-10-CM | POA: Diagnosis not present

## 2023-06-25 DIAGNOSIS — D57219 Sickle-cell/Hb-C disease with crisis, unspecified: Secondary | ICD-10-CM | POA: Diagnosis not present

## 2023-06-25 NOTE — Progress Notes (Signed)
 Chief Complaint  Patient presents with   New Patient (Initial Visit)    Rm15, alone, NP Internal referral for bells palsy: legally blind, right sided affected by bells palsy       ASSESSMENT AND PLAN  Patrick [name] is a 68 y.o. male   Right Bell's Palsy  Started since June 13 2023, was treated with renal adjusted dose of Valtrex , prednisone   MRI of brain showed no acute abnormality, moderate small vessel disease evidence of chronic left MCA stroke  Severe right upper and lower face weakness, hope to regain some recovery as time goes by, suggest lubricant for his right eye  Continue follow-up with his primary care and nephrologist   DIAGNOSTIC DATA (LABS, IMAGING, TESTING) - I reviewed patient records, labs, notes, testing and imaging myself where available.   MEDICAL HISTORY:  Patrick [name] is a 68 year old male, seen in request by his nephrologist Dr. Jegede, Olugbemiga for evaluation of right facial weakness, initial evaluation was on June 25, 2023  History is obtained from the patient and review of electronic medical records. I personally reviewed pertinent available imaging films in PACS.   PMHx of  Sickle cell disease. 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, on June 13, 2023, at work, in the afternoon, while eating Christmas dinner, he noticed right facial achiness, and weakness of right upper and lower face, which has been persistent since then, was treated at emergency room, giving the prescription of prednisone  tapering, Valtrex  500 mg daily for 6 days,  He has end-stage renal disease on hemodialysis 3 times a week  Over the past few weeks, he continue have significant right upper and lower face weakness, mild difficulty talking, no right upper and lower extremity involvement, is getting eyedrop  He had MRI of the brain June 13, 2023,  no acute abnormality, evidence of chronic left MCA stroke moderate small vessel disease     PHYSICAL EXAM:   Vitals:   06/25/23 1139  BP: 118/81  Pulse: 83  Resp: 15  Height: 5' 11 (1.803 m)   Not recorded     Body mass index is 20.7 kg/m.  PHYSICAL EXAMNIATION:  Gen: NAD, conversant, well nourised, well groomed                     Cardiovascular: Regular rate rhythm, no peripheral edema, warm, nontender. Eyes: Conjunctivae clear without exudates or hemorrhage Neck: Supple, no carotid bruits. Pulmonary: Clear to auscultation bilaterally   NEUROLOGICAL EXAM:  MENTAL STATUS: Speech/cognition: Awake, alert, oriented to history taking and casual conversation CRANIAL NERVES: CN II: Visual fields are full to confrontation.  Postsurgical change of bilateral pupil  CN III, IV, VI: extraocular movement are normal. No ptosis. CN V: Facial sensation is intact to light touch CN VII: Significant right upper and lower face weakness CN VIII: Hearing is normal to causal conversation. CN IX, X: Phonation is normal. CN XI: Head turning and shoulder shrug are intact  MOTOR: There is no pronator drift of out-stretched arms. Muscle bulk and tone are normal. Muscle strength is normal.  REFLEXES: Reflexes are 2+ and symmetric at the biceps, triceps, knees, and ankles. Plantar responses are flexor.  SENSORY: Intact to light touch, pinprick and vibratory sensation are intact in fingers and toes.  COORDINATION: There is no trunk or limb dysmetria noted.  GAIT/STANCE: Posture is normal. Gait is steady  REVIEW OF SYSTEMS:  Full 14 system review of systems performed and notable only for as above All other review of systems were negative.   ALLERGIES: No Known Allergies  HOME MEDICATIONS: Current Outpatient Medications  Medication Sig Dispense Refill   acetaminophen  (TYLENOL ) 500 MG tablet Take 1,500 mg by mouth 3 (three) times daily as needed for mild pain, moderate pain,  fever or headache.     allopurinol  (ZYLOPRIM ) 100 MG tablet Take 1 tablet (100 mg total) by mouth Every Tuesday,Thursday,and Saturday with dialysis. 12 tablet 0   AURYXIA  1 GM 210 MG(Fe) tablet Take 210-420 mg by mouth See admin instructions. 420 mg with every meal, 210 mg with each snack.     cinacalcet  (SENSIPAR ) 30 MG tablet Take 1 tablet (30 mg total) by mouth Every Tuesday,Thursday,and Saturday with dialysis. 60 tablet 0   diphenhydramine -acetaminophen  (TYLENOL  PM) 25-500 MG TABS tablet Take 2 tablets by mouth at bedtime as needed (sleep/pain). 14 tablet 3   folic acid  (FOLVITE ) 1 MG tablet TAKE 1 TABLET BY MOUTH EVERY DAY 90 tablet 1   gabapentin  (NEURONTIN ) 100 MG capsule Take 1 capsule (100 mg total) by mouth 3 (three) times daily. 90 capsule 3   hydroxyurea  (HYDREA ) 500 MG capsule Take 1 capsule (500 mg total) by mouth daily. 90 capsule 1   lidocaine -prilocaine  (EMLA ) cream Apply 1 application. topically Every Tuesday,Thursday,and Saturday with dialysis.     midodrine  (PROAMATINE ) 10 MG tablet Take 1 tablet (10 mg total) by mouth 3 (three) times daily with meals. 90 tablet 0   ofloxacin (OCUFLOX) 0.3 % ophthalmic solution Place 1 drop into both eyes 2 (two) times daily.     oxyCODONE  (OXYCONTIN ) 10 mg 12 hr tablet Take 1 tablet (10 mg total) by mouth every 12 (twelve) hours. 30 tablet 0   oxyCODONE  (ROXICODONE ) 5 MG immediate release tablet Take 2 tablets (10 mg total) by mouth every 6 (six) hours as needed for severe pain (pain score 7-10) or breakthrough pain. 12 tablet 0   pantoprazole  (PROTONIX ) 40 MG tablet Take 1 tablet (40 mg total) by mouth daily. Use this prescription after done with twice daily dosing x 2 months 30 tablet 0   prednisoLONE acetate (PRED FORTE) 1 % ophthalmic suspension Place 1 drop into both eyes 2 (two) times daily.     timolol (TIMOPTIC) 0.5 % ophthalmic solution Place 1 drop into both eyes 2 (two) times daily.     tiZANidine  (ZANAFLEX ) 4 MG tablet TAKE 1/2 TABLET  (2MG  TOTAL) BY MOUTH EVERY 8 HOURS AS NEEDED FOR MUSCLE SPASM 30 tablet 2   [Paused] torsemide  (DEMADEX ) 20 MG tablet TAKE 2 TABLETS BY MOUTH IN THE MORNING AND AT BEDTIME (Patient not taking: Reported on 06/25/2023) 360 tablet 0   No current facility-administered medications for this visit.    PAST MEDICAL HISTORY: Past Medical History:  Diagnosis Date   A-fib (HCC)    Blood dyscrasia    Blood transfusion    I've had a bunch of them   CHF (congestive heart failure) (HCC)    followed by hf clinic   Chronic kidney disease    Stage 5, dialysis on Tu/Th/Sa   Dialysis patient (HCC) 04/2019   AV fistula (right arm), TTS   Dysrhythmia    A-Fib per patient   Early satiety    Elevated LFTs 04/01/2022   Elevated troponin 04/01/2022   Empyema of gallbladder 05/27/2021   GERD (gastroesophageal reflux disease)    Gout    Legally blind  Metabolic acidosis with increased anion gap and accumulation of organic acids 04/01/2022   Mitral regurgitation    Muscle tightness-Right arm  05/27/2014   Pneumonia    Pulmonary hypertension (HCC)    Shortness of breath dyspnea    Sickle cell disease, type SS (HCC)    Swelling abdomen    Swelling of extremity, left    Swelling of extremity, right    Tricuspid regurgitation     PAST SURGICAL HISTORY: Past Surgical History:  Procedure Laterality Date   A/V FISTULAGRAM Right 07/29/2020   Procedure: A/V FISTULAGRAM - Right Arm;  Surgeon: Eliza Lonni RAMAN, MD;  Location: [facility] INVASIVE CV LAB;  Service: Cardiovascular;  Laterality: Right;   BASCILIC VEIN TRANSPOSITION Right 04/12/2014   Procedure: BASILIC VEIN TRANSPOSITION;  Surgeon: Carlin FORBES Haddock, MD;  Location: [facility] OR;  Service: Vascular;  Laterality: Right;   BILIARY STENT PLACEMENT  04/04/2022   Procedure: BILIARY STENT PLACEMENT;  Surgeon: Aneita Gwendlyn DASEN, MD;  Location: [facility] ENDOSCOPY;  Service: Gastroenterology;;   BIOPSY  08/04/2020   Procedure: BIOPSY;  Surgeon: Aneita Gwendlyn DASEN, MD;   Location: [facility] ENDOSCOPY;  Service: Gastroenterology;;   BIOPSY  06/28/2021   Procedure: BIOPSY;  Surgeon: Albertus Gordy HERO, MD;  Location: [facility] ENDOSCOPY;  Service: Gastroenterology;;   BIOPSY  04/02/2022   Procedure: BIOPSY;  Surgeon: Wilhelmenia Aloha Raddle., MD;  Location: [facility] ENDOSCOPY;  Service: Gastroenterology;;   BIOPSY  10/22/2022   Procedure: BIOPSY;  Surgeon: Albertus Gordy HERO, MD;  Location: [facility] ENDOSCOPY;  Service: Gastroenterology;;   BOWEL RESECTION N/A 05/24/2021   Procedure: SMALL BOWEL RESECTION;  Surgeon: Ebbie Cough, MD;  Location: [facility] OR;  Service: General;  Laterality: N/A;   CARDIOVERSION N/A 06/05/2018   Procedure: CARDIOVERSION;  Surgeon: Cherrie Toribio SAUNDERS, MD;  Location: [facility] ENDOSCOPY;  Service: Cardiovascular;  Laterality: N/A;   CARDIOVERSION N/A 10/06/2019   Procedure: CARDIOVERSION;  Surgeon: Cherrie Toribio SAUNDERS, MD;  Location: [facility] ENDOSCOPY;  Service: Cardiovascular;  Laterality: N/A;   CHOLECYSTECTOMY N/A 05/24/2021   Procedure: OPEN CHOLECYSTECTOMY;  Surgeon: Ebbie Cough, MD;  Location: [facility] OR;  Service: General;  Laterality: N/A;   COLONOSCOPY WITH PROPOFOL  N/A 09/24/2017   Procedure: COLONOSCOPY WITH PROPOFOL ;  Surgeon: Charlanne Groom, MD;  Location: [facility] ENDOSCOPY;  Service: Endoscopy;  Laterality: N/A;   ENDOSCOPIC RETROGRADE CHOLANGIOPANCREATOGRAPHY (ERCP) WITH PROPOFOL  N/A 05/17/2022   Procedure: ENDOSCOPIC RETROGRADE CHOLANGIOPANCREATOGRAPHY (ERCP) WITH PROPOFOL ;  Surgeon: Wilhelmenia Aloha Raddle., MD;  Location: [facility] ENDOSCOPY;  Service: Gastroenterology;  Laterality: N/A;   ERCP N/A 04/04/2022   Procedure: ENDOSCOPIC RETROGRADE CHOLANGIOPANCREATOGRAPHY (ERCP);  Surgeon: Aneita Gwendlyn DASEN, MD;  Location: [facility] ENDOSCOPY;  Service: Gastroenterology;  Laterality: N/A;   ESOPHAGOGASTRODUODENOSCOPY N/A 09/19/2017   Procedure: ESOPHAGOGASTRODUODENOSCOPY (EGD);  Surgeon: Charlanne Groom, MD;  Location: [facility] ENDOSCOPY;  Service: Endoscopy;  Laterality: N/A;    ESOPHAGOGASTRODUODENOSCOPY N/A 05/29/2021   Procedure: ESOPHAGOGASTRODUODENOSCOPY (EGD);  Surgeon: Teressa Toribio SQUIBB, MD;  Location: [facility] ENDOSCOPY;  Service: Gastroenterology;  Laterality: N/A;   ESOPHAGOGASTRODUODENOSCOPY (EGD) WITH PROPOFOL  N/A 08/04/2020   Procedure: ESOPHAGOGASTRODUODENOSCOPY (EGD) WITH PROPOFOL ;  Surgeon: Aneita Gwendlyn DASEN, MD;  Location: [facility] ENDOSCOPY;  Service: Gastroenterology;  Laterality: N/A;   ESOPHAGOGASTRODUODENOSCOPY (EGD) WITH PROPOFOL  N/A 06/28/2021   Procedure: ESOPHAGOGASTRODUODENOSCOPY (EGD) WITH PROPOFOL ;  Surgeon: Albertus Gordy HERO, MD;  Location: [facility] ENDOSCOPY;  Service: Gastroenterology;  Laterality: N/A;   ESOPHAGOGASTRODUODENOSCOPY (EGD) WITH PROPOFOL  N/A 08/10/2021   Procedure: ESOPHAGOGASTRODUODENOSCOPY (EGD) WITH PROPOFOL ;  Surgeon: Charlanne Groom, MD;  Location: [facility] ENDOSCOPY;  Service: Gastroenterology;  Laterality: N/A;  ESOPHAGOGASTRODUODENOSCOPY (EGD) WITH PROPOFOL  N/A 08/02/2021   Procedure: ESOPHAGOGASTRODUODENOSCOPY (EGD) WITH PROPOFOL ;  Surgeon: San Sandor GAILS, DO;  Location: [facility] ENDOSCOPY;  Service: Gastroenterology;  Laterality: N/A;   ESOPHAGOGASTRODUODENOSCOPY (EGD) WITH PROPOFOL  N/A 04/02/2022   Procedure: ESOPHAGOGASTRODUODENOSCOPY (EGD) WITH PROPOFOL ;  Surgeon: Wilhelmenia Aloha Raddle., MD;  Location: [facility] ENDOSCOPY;  Service: Gastroenterology;  Laterality: N/A;   ESOPHAGOGASTRODUODENOSCOPY (EGD) WITH PROPOFOL  N/A 05/17/2022   Procedure: ESOPHAGOGASTRODUODENOSCOPY (EGD) WITH PROPOFOL ;  Surgeon: Wilhelmenia Aloha Raddle., MD;  Location: [facility] ENDOSCOPY;  Service: Gastroenterology;  Laterality: N/A;   ESOPHAGOGASTRODUODENOSCOPY (EGD) WITH PROPOFOL  N/A 10/22/2022   Procedure: ESOPHAGOGASTRODUODENOSCOPY (EGD) WITH PROPOFOL ;  Surgeon: Albertus Gordy HERO, MD;  Location: [facility] ENDOSCOPY;  Service: Gastroenterology;  Laterality: N/A;   EUS N/A 05/17/2022   Procedure: UPPER ENDOSCOPIC ULTRASOUND (EUS) RADIAL;  Surgeon: Wilhelmenia Aloha Raddle., MD;  Location: [facility] ENDOSCOPY;   Service: Gastroenterology;  Laterality: N/A;   FINE NEEDLE ASPIRATION  04/02/2022   Procedure: FINE NEEDLE ASPIRATION (FNA) LINEAR;  Surgeon: Wilhelmenia Aloha Raddle., MD;  Location: [facility] ENDOSCOPY;  Service: Gastroenterology;;   FINE NEEDLE ASPIRATION N/A 05/17/2022   Procedure: FINE NEEDLE ASPIRATION (FNA) LINEAR;  Surgeon: Wilhelmenia Aloha Raddle., MD;  Location: [facility] ENDOSCOPY;  Service: Gastroenterology;  Laterality: N/A;   HEMOSTASIS CLIP PLACEMENT  05/29/2021   Procedure: HEMOSTASIS CLIP PLACEMENT;  Surgeon: Teressa Toribio SQUIBB, MD;  Location: [facility] ENDOSCOPY;  Service: Gastroenterology;;   HOT HEMOSTASIS N/A 05/29/2021   Procedure: HOT HEMOSTASIS (ARGON PLASMA COAGULATION/BICAP);  Surgeon: Teressa Toribio SQUIBB, MD;  Location: [facility] ENDOSCOPY;  Service: Gastroenterology;  Laterality: N/A;   HOT HEMOSTASIS N/A 06/28/2021   Procedure: HOT HEMOSTASIS (ARGON PLASMA COAGULATION/BICAP);  Surgeon: Albertus Gordy HERO, MD;  Location: [facility] ENDOSCOPY;  Service: Gastroenterology;  Laterality: N/A;   HOT HEMOSTASIS N/A 08/10/2021   Procedure: HOT HEMOSTASIS (ARGON PLASMA COAGULATION/BICAP);  Surgeon: Charlanne Groom, MD;  Location: [facility] ENDOSCOPY;  Service: Gastroenterology;  Laterality: N/A;   HOT HEMOSTASIS N/A 08/02/2021   Procedure: HOT HEMOSTASIS (ARGON PLASMA COAGULATION/BICAP);  Surgeon: San Sandor GAILS, DO;  Location: [facility] ENDOSCOPY;  Service: Gastroenterology;  Laterality: N/A;   IR PARACENTESIS  07/04/2022   IR PERC TUN PERIT CATH WO PORT S&I /IMAG  05/23/2021   IR REMOVAL TUN CV CATH W/O FL  06/10/2021   IR US  GUIDE VASC ACCESS RIGHT  05/23/2021   LAPAROSCOPY N/A 05/24/2021   Procedure: LAPAROSCOPY DIAGNOSTIC;  Surgeon: Ebbie Cough, MD;  Location: [facility] OR;  Service: General;  Laterality: N/A;   LAPAROTOMY N/A 05/24/2021   Procedure: EXPLORATORY LAPAROTOMY;  Surgeon: Ebbie Cough, MD;  Location: [facility] OR;  Service: General;  Laterality: N/A;   LEFT AND RIGHT HEART CATHETERIZATION WITH CORONARY ANGIOGRAM N/A  06/11/2011   Procedure: LEFT AND RIGHT HEART CATHETERIZATION WITH CORONARY ANGIOGRAM;  Surgeon: Ezra GORMAN Shuck, MD;  Location: [facility] CATH LAB;  Service: Cardiovascular;  Laterality: N/A;   PERIPHERAL VASCULAR BALLOON ANGIOPLASTY Right 07/29/2020   Procedure: PERIPHERAL VASCULAR BALLOON ANGIOPLASTY;  Surgeon: Eliza Lonni GORMAN, MD;  Location: [facility] INVASIVE CV LAB;  Service: Cardiovascular;  Laterality: Right;  arm fistula   REMOVAL OF STONES  05/17/2022   Procedure: REMOVAL OF STONES;  Surgeon: Wilhelmenia Aloha Raddle., MD;  Location: [facility] ENDOSCOPY;  Service: Gastroenterology;;   REVISON OF ARTERIOVENOUS FISTULA Right 08/08/2020   Procedure: ANEURYSM EXCISION OF RIGHT UPPER EXTREMITY ARTERIOVENOUS FISTULA;  Surgeon: Eliza Lonni GORMAN, MD;  Location: [facility] OR;  Service: Vascular;  Laterality: Right;   REVISON OF ARTERIOVENOUS FISTULA Right 03/21/2022   Procedure: EXCISION OF ULCERATED SKIN OF  RIGHT ARTERIOVENOUS FISTULA REVISION;  Surgeon: Gretta Lonni PARAS, MD;  Location: [facility] OR;  Service: Vascular;  Laterality: Right;   REVISON OF ARTERIOVENOUS FISTULA Right 07/02/2022   Procedure: EXCISION OF ULCERS RIGHT ARTERIOVENOUS FISTULA;  Surgeon: Eliza Lonni RAMAN, MD;  Location: [facility] OR;  Service: Vascular;  Laterality: Right;   SCLEROTHERAPY  05/29/2021   Procedure: MATIAS;  Surgeon: Teressa Toribio SQUIBB, MD;  Location: [facility] ENDOSCOPY;  Service: Gastroenterology;;   ANNETT  04/04/2022   Procedure: SPHINCTEROTOMY;  Surgeon: Aneita Gwendlyn DASEN, MD;  Location: [facility] ENDOSCOPY;  Service: Gastroenterology;;   CLEDA REMOVAL  05/29/2021   Procedure: STENT REMOVAL;  Surgeon: Teressa Toribio SQUIBB, MD;  Location: [facility] ENDOSCOPY;  Service: Gastroenterology;;   CLEDA REMOVAL  05/17/2022   Procedure: STENT REMOVAL;  Surgeon: Wilhelmenia Aloha Raddle., MD;  Location: [facility] ENDOSCOPY;  Service: Gastroenterology;;   TEE WITHOUT CARDIOVERSION N/A 12/27/2015   Procedure: TRANSESOPHAGEAL ECHOCARDIOGRAM (TEE);  Surgeon:  Wilbert JONELLE Bihari, MD;  Location: [facility] ENDOSCOPY;  Service: Cardiovascular;  Laterality: N/A;   UPPER ESOPHAGEAL ENDOSCOPIC ULTRASOUND (EUS) N/A 04/02/2022   Procedure: UPPER ESOPHAGEAL ENDOSCOPIC ULTRASOUND (EUS);  Surgeon: Wilhelmenia Aloha Raddle., MD;  Location: [facility] ENDOSCOPY;  Service: Gastroenterology;  Laterality: N/A;    FAMILY HISTORY: Family History  Problem Relation Age of Onset   Alcohol  abuse Mother    Liver disease Mother    Cirrhosis Mother    Heart attack Mother     SOCIAL HISTORY: Social History   Socioeconomic History   Marital status: Divorced    Spouse name: Not on file   Number of children: Not on file   Years of education: Not on file   Highest education level: Not on file  Occupational History   Occupation: disabled  Tobacco Use   Smoking status: Former    Current packs/day: 0.00    Average packs/day: 0.5 packs/day for 41.0 years (20.5 ttl pk-yrs)    Types: Cigarettes    Start date: 12/08/1974    Quit date: 12/08/2015    Years since quitting: 7.5   Smokeless tobacco: Never  Vaping Use   Vaping status: Never Used  Substance and Sexual Activity   Alcohol  use: Not Currently    Alcohol /week: 3.0 standard drinks of alcohol     Types: 1 Glasses of wine, 2 Cans of beer per week    Comment: none since starting dialysis 06/2021   Drug use: No   Sexual activity: Not Currently  Other Topics Concern   Not on file  Social History Narrative   Not on file   Social Drivers of Health   Financial Resource Strain: Low Risk  (09/27/2022)   Overall Financial Resource Strain (CARDIA)    Difficulty of Paying Living Expenses: Not hard at all  Food Insecurity: No Food Insecurity (06/01/2023)   Hunger Vital Sign    Worried About Running Out of Food in the Last Year: Never true    Ran Out of Food in the Last Year: Never true  Transportation Needs: No Transportation Needs (06/01/2023)   PRAPARE - Administrator, Civil Service (Medical): No    Lack of  Transportation (Non-Medical): No  Physical Activity: Inactive (09/27/2022)   Exercise Vital Sign    Days of Exercise per Week: 0 days    Minutes of Exercise per Session: 0 min  Stress: No Stress Concern Present (09/27/2022)   [employer] of Occupational Health - Occupational Stress Questionnaire    Feeling of Stress : Not at all  Social Connections:  Socially Isolated (09/27/2022)   Social Connection and Isolation Panel [NHANES]    Frequency of Communication with Friends and Family: More than three times a week    Frequency of Social Gatherings with Friends and Family: More than three times a week    Attends Religious Services: Never    Database Administrator or Organizations: No    Attends Banker Meetings: Never    Marital Status: Divorced  Catering Manager Violence: Not At Risk (06/01/2023)   Humiliation, Afraid, Rape, and Kick questionnaire    Fear of Current or Ex-Partner: No    Emotionally Abused: No    Physically Abused: No    Sexually Abused: No      Modena Callander, M.D. Ph.D.  [facility] [address], [address] [place], [place] [address] Ph: [phone] Fax: [phone]  CC:  Oley Bascom RAMAN, NP [phone] [place],  [place] [address]  Jegede, Olugbemiga E, MD

## 2023-07-01 ENCOUNTER — Encounter: Payer: Self-pay | Admitting: Physical Therapy

## 2023-07-01 ENCOUNTER — Ambulatory Visit: Payer: PPO | Admitting: Physical Therapy

## 2023-07-01 VITALS — BP 101/68 | HR 65

## 2023-07-01 DIAGNOSIS — R29898 Other symptoms and signs involving the musculoskeletal system: Secondary | ICD-10-CM | POA: Diagnosis not present

## 2023-07-01 DIAGNOSIS — M6281 Muscle weakness (generalized): Secondary | ICD-10-CM

## 2023-07-01 DIAGNOSIS — R2681 Unsteadiness on feet: Secondary | ICD-10-CM | POA: Diagnosis not present

## 2023-07-01 DIAGNOSIS — M25561 Pain in right knee: Secondary | ICD-10-CM | POA: Diagnosis not present

## 2023-07-01 DIAGNOSIS — G8929 Other chronic pain: Secondary | ICD-10-CM

## 2023-07-01 NOTE — Patient Instructions (Addendum)
 P:      Protection is meant to prevent further injury. For example, an injured leg or foot may be protected by limiting or avoiding weight-bearing through the use of crutches, a cane, or hiking poles. Partially immobilizing the injured area by using a sling, splint, or brace may also be a means of protection.  R:      Rest is important to allow for healing. However, many sports medicine specialists use the term "relative rest" meaning rest that allows for healing, but is not so restrictive that recovery is compromised or slowed. A person should avoid activities that stress the injured area to the point of pain or that may slow or prevent healing. Some movement, however, is beneficial. Gentle, pain-free, range-of-motion and basic isometric contractions of the joints and muscles surrounding an injury have been shown to speed recovery.  advertisement I:      Ice refers to the use of cold treatments, also known as cryotherapy, to treat acute injuries. Ice is recommended with the intent to minimize and reduce swelling as well as to decrease pain. There are many ways to employ cryotherapy at home. The most common and most convenient is a simple plastic bag of crushed ice placed over a paper towel on the affected area. It is important to protect the skin and limit the cold exposure to 10 to 15 minutes. Cycles of 10 to 15 minutes on and 1 to 2 hours off are generally agreed upon as effective and safer than longer periods of continuous ice application.  Skin sensitivity or allergy to cold exposure can occur. It may manifest as skin that becomes mottled, red and raised where the ice contacted the skin. If this is experienced, the ice treatments should be discontinued. Redness alone, however, is common and should resolve after a few minutes of re-warming.  advertisement C:      Compression is the use of a compression wrap, such as an elastic bandage, to apply an external force to the injured tissue. This compression  minimizes swelling and provides mild support.  Applying an elastic bandage does require some attention to detail. It should be applied directly to the skin by starting a few inches below the injury and wrapping in a figure eight or spiraling manner to a few inches above the injured area. A medium amount of tension should be applied to provide ample, but not too constrictive compression. The bandage should not cause numbness, tingling, or color change of the soft tissue. Loosening the bandage should quickly alleviate these should they occur. It is generally best to remove or significantly loosen the elastic bandage for sleeping and to re-apply it the next morning.  E:      Elevation is recommended to help reduce the pooling of fluid in the injured extremity or joint. Controlling swelling can help decrease pain and may limit the loss of range of motion, possibly speeding up recovery time.  Elevation is accomplished by positioning the injured area above the level of the heart. Elevation during most of the waking hours, if possible, and positioning the injured limb on extra pillows for sleep is probably most effective in the initial 24 to 48 hours. If there is significant swelling which continues after 24 to 48 hours, or if swelling recurs during recovery, then continued periodic elevation is appropriate.  Access Code: [email or web address] URL: [email or web address] Date: 07/01/2023 Prepared by: Daved Bull  Exercises - 4 Way Patellar Glide  - 1 x daily - 7 x weekly -  3 sets - 10 reps - Clamshell  - 1 x daily - 4 x weekly - 3 sets - 10 reps - Sit to Stand with Resistance Around Legs  - 1 x daily - 4 x weekly - 2-3 sets - 10 reps

## 2023-07-01 NOTE — Therapy (Signed)
 OUTPATIENT PHYSICAL THERAPY LOWER EXTREMITY TREATMENT   Patient Name: TIRRELL BUCHBERGER MRN: [id number] DOB:May 15, 1956, 68 y.o., male Today's Date: 07/01/2023  END OF SESSION:  PT End of Session - 07/01/23 1539     Visit Number 2    Number of Visits 9   8 + eval   Date for PT Re-Evaluation 08/09/23   pushed out due to holiday scheduling   Authorization Type [employer]    PT Start Time 1532    PT Stop Time 1615    PT Time Calculation (min) 43 min    Equipment Utilized During Treatment Gait belt    Activity Tolerance Patient tolerated treatment well    Behavior During Therapy WFL for tasks assessed/performed            Past Medical History:  Diagnosis Date   A-fib (HCC)    Blood dyscrasia    Blood transfusion    I've had a bunch of them   CHF (congestive heart failure) (HCC)    followed by hf clinic   Chronic kidney disease    Stage 5, dialysis on Tu/Th/Sa   Dialysis patient (HCC) 04/2019   AV fistula (right arm), TTS   Dysrhythmia    A-Fib per patient   Early satiety    Elevated LFTs 04/01/2022   Elevated troponin 04/01/2022   Empyema of gallbladder 05/27/2021   GERD (gastroesophageal reflux disease)    Gout    Legally blind    Metabolic acidosis with increased anion gap and accumulation of organic acids 04/01/2022   Mitral regurgitation    Muscle tightness-Right arm  05/27/2014   Pneumonia    Pulmonary hypertension (HCC)    Shortness of breath dyspnea    Sickle cell disease, type SS (HCC)    Swelling abdomen    Swelling of extremity, left    Swelling of extremity, right    Tricuspid regurgitation    Past Surgical History:  Procedure Laterality Date   A/V FISTULAGRAM Right 07/29/2020   Procedure: A/V FISTULAGRAM - Right Arm;  Surgeon: Eliza Lonni RAMAN, MD;  Location: [facility] INVASIVE CV LAB;  Service: Cardiovascular;  Laterality: Right;   BASCILIC VEIN TRANSPOSITION Right 04/12/2014   Procedure: BASILIC VEIN TRANSPOSITION;  Surgeon:  Carlin FORBES Haddock, MD;  Location: [facility] OR;  Service: Vascular;  Laterality: Right;   BILIARY STENT PLACEMENT  04/04/2022   Procedure: BILIARY STENT PLACEMENT;  Surgeon: Aneita Gwendlyn DASEN, MD;  Location: [facility] ENDOSCOPY;  Service: Gastroenterology;;   BIOPSY  08/04/2020   Procedure: BIOPSY;  Surgeon: Aneita Gwendlyn DASEN, MD;  Location: [facility] ENDOSCOPY;  Service: Gastroenterology;;   BIOPSY  06/28/2021   Procedure: BIOPSY;  Surgeon: Albertus Gordy HERO, MD;  Location: [facility] ENDOSCOPY;  Service: Gastroenterology;;   BIOPSY  04/02/2022   Procedure: BIOPSY;  Surgeon: Wilhelmenia Aloha Raddle., MD;  Location: [facility] ENDOSCOPY;  Service: Gastroenterology;;   BIOPSY  10/22/2022   Procedure: BIOPSY;  Surgeon: Albertus Gordy HERO, MD;  Location: [facility] ENDOSCOPY;  Service: Gastroenterology;;   BOWEL RESECTION N/A 05/24/2021   Procedure: SMALL BOWEL RESECTION;  Surgeon: Ebbie Cough, MD;  Location: [facility] OR;  Service: General;  Laterality: N/A;   CARDIOVERSION N/A 06/05/2018   Procedure: CARDIOVERSION;  Surgeon: Cherrie Toribio SAUNDERS, MD;  Location: [facility] ENDOSCOPY;  Service: Cardiovascular;  Laterality: N/A;   CARDIOVERSION N/A 10/06/2019   Procedure: CARDIOVERSION;  Surgeon: Cherrie Toribio SAUNDERS, MD;  Location: [facility] ENDOSCOPY;  Service: Cardiovascular;  Laterality: N/A;   CHOLECYSTECTOMY N/A 05/24/2021   Procedure: OPEN CHOLECYSTECTOMY;  Surgeon: Ebbie Cough, MD;  Location: [facility] OR;  Service: General;  Laterality: N/A;   COLONOSCOPY WITH PROPOFOL  N/A 09/24/2017   Procedure: COLONOSCOPY WITH PROPOFOL ;  Surgeon: Charlanne Groom, MD;  Location: [facility] ENDOSCOPY;  Service: Endoscopy;  Laterality: N/A;   ENDOSCOPIC RETROGRADE CHOLANGIOPANCREATOGRAPHY (ERCP) WITH PROPOFOL  N/A 05/17/2022   Procedure: ENDOSCOPIC RETROGRADE CHOLANGIOPANCREATOGRAPHY (ERCP) WITH PROPOFOL ;  Surgeon: Wilhelmenia Aloha Raddle., MD;  Location: [facility] ENDOSCOPY;  Service: Gastroenterology;  Laterality: N/A;   ERCP N/A 04/04/2022   Procedure: ENDOSCOPIC RETROGRADE  CHOLANGIOPANCREATOGRAPHY (ERCP);  Surgeon: Aneita Gwendlyn DASEN, MD;  Location: [facility] ENDOSCOPY;  Service: Gastroenterology;  Laterality: N/A;   ESOPHAGOGASTRODUODENOSCOPY N/A 09/19/2017   Procedure: ESOPHAGOGASTRODUODENOSCOPY (EGD);  Surgeon: Charlanne Groom, MD;  Location: [facility] ENDOSCOPY;  Service: Endoscopy;  Laterality: N/A;   ESOPHAGOGASTRODUODENOSCOPY N/A 05/29/2021   Procedure: ESOPHAGOGASTRODUODENOSCOPY (EGD);  Surgeon: Teressa Toribio SQUIBB, MD;  Location: [facility] ENDOSCOPY;  Service: Gastroenterology;  Laterality: N/A;   ESOPHAGOGASTRODUODENOSCOPY (EGD) WITH PROPOFOL  N/A 08/04/2020   Procedure: ESOPHAGOGASTRODUODENOSCOPY (EGD) WITH PROPOFOL ;  Surgeon: Aneita Gwendlyn DASEN, MD;  Location: [facility] ENDOSCOPY;  Service: Gastroenterology;  Laterality: N/A;   ESOPHAGOGASTRODUODENOSCOPY (EGD) WITH PROPOFOL  N/A 06/28/2021   Procedure: ESOPHAGOGASTRODUODENOSCOPY (EGD) WITH PROPOFOL ;  Surgeon: Albertus Gordy HERO, MD;  Location: [facility] ENDOSCOPY;  Service: Gastroenterology;  Laterality: N/A;   ESOPHAGOGASTRODUODENOSCOPY (EGD) WITH PROPOFOL  N/A 08/10/2021   Procedure: ESOPHAGOGASTRODUODENOSCOPY (EGD) WITH PROPOFOL ;  Surgeon: Charlanne Groom, MD;  Location: [facility] ENDOSCOPY;  Service: Gastroenterology;  Laterality: N/A;   ESOPHAGOGASTRODUODENOSCOPY (EGD) WITH PROPOFOL  N/A 08/02/2021   Procedure: ESOPHAGOGASTRODUODENOSCOPY (EGD) WITH PROPOFOL ;  Surgeon: San Sandor GAILS, DO;  Location: [facility] ENDOSCOPY;  Service: Gastroenterology;  Laterality: N/A;   ESOPHAGOGASTRODUODENOSCOPY (EGD) WITH PROPOFOL  N/A 04/02/2022   Procedure: ESOPHAGOGASTRODUODENOSCOPY (EGD) WITH PROPOFOL ;  Surgeon: Wilhelmenia Aloha Raddle., MD;  Location: [facility] ENDOSCOPY;  Service: Gastroenterology;  Laterality: N/A;   ESOPHAGOGASTRODUODENOSCOPY (EGD) WITH PROPOFOL  N/A 05/17/2022   Procedure: ESOPHAGOGASTRODUODENOSCOPY (EGD) WITH PROPOFOL ;  Surgeon: Wilhelmenia Aloha Raddle., MD;  Location: [facility] ENDOSCOPY;  Service: Gastroenterology;  Laterality: N/A;   ESOPHAGOGASTRODUODENOSCOPY (EGD) WITH  PROPOFOL  N/A 10/22/2022   Procedure: ESOPHAGOGASTRODUODENOSCOPY (EGD) WITH PROPOFOL ;  Surgeon: Albertus Gordy HERO, MD;  Location: [facility] ENDOSCOPY;  Service: Gastroenterology;  Laterality: N/A;   EUS N/A 05/17/2022   Procedure: UPPER ENDOSCOPIC ULTRASOUND (EUS) RADIAL;  Surgeon: Wilhelmenia Aloha Raddle., MD;  Location: [facility] ENDOSCOPY;  Service: Gastroenterology;  Laterality: N/A;   FINE NEEDLE ASPIRATION  04/02/2022   Procedure: FINE NEEDLE ASPIRATION (FNA) LINEAR;  Surgeon: Wilhelmenia Aloha Raddle., MD;  Location: [facility] ENDOSCOPY;  Service: Gastroenterology;;   FINE NEEDLE ASPIRATION N/A 05/17/2022   Procedure: FINE NEEDLE ASPIRATION (FNA) LINEAR;  Surgeon: Wilhelmenia Aloha Raddle., MD;  Location: [facility] ENDOSCOPY;  Service: Gastroenterology;  Laterality: N/A;   HEMOSTASIS CLIP PLACEMENT  05/29/2021   Procedure: HEMOSTASIS CLIP PLACEMENT;  Surgeon: Teressa Toribio SQUIBB, MD;  Location: [facility] ENDOSCOPY;  Service: Gastroenterology;;   HOT HEMOSTASIS N/A 05/29/2021   Procedure: HOT HEMOSTASIS (ARGON PLASMA COAGULATION/BICAP);  Surgeon: Teressa Toribio SQUIBB, MD;  Location: [facility] ENDOSCOPY;  Service: Gastroenterology;  Laterality: N/A;   HOT HEMOSTASIS N/A 06/28/2021   Procedure: HOT HEMOSTASIS (ARGON PLASMA COAGULATION/BICAP);  Surgeon: Albertus Gordy HERO, MD;  Location: [facility] ENDOSCOPY;  Service: Gastroenterology;  Laterality: N/A;   HOT HEMOSTASIS N/A 08/10/2021   Procedure: HOT HEMOSTASIS (ARGON PLASMA COAGULATION/BICAP);  Surgeon: Charlanne Groom, MD;  Location: [facility] ENDOSCOPY;  Service: Gastroenterology;  Laterality: N/A;   HOT HEMOSTASIS N/A 08/02/2021   Procedure: HOT HEMOSTASIS (ARGON PLASMA COAGULATION/BICAP);  Surgeon: San Sandor GAILS, DO;  Location: [facility]  ENDOSCOPY;  Service: Gastroenterology;  Laterality: N/A;   IR PARACENTESIS  07/04/2022   IR PERC TUN PERIT CATH WO PORT S&I /IMAG  05/23/2021   IR REMOVAL TUN CV CATH W/O FL  06/10/2021   IR US  GUIDE VASC ACCESS RIGHT  05/23/2021   LAPAROSCOPY N/A 05/24/2021   Procedure: LAPAROSCOPY  DIAGNOSTIC;  Surgeon: Ebbie Cough, MD;  Location: [facility] OR;  Service: General;  Laterality: N/A;   LAPAROTOMY N/A 05/24/2021   Procedure: EXPLORATORY LAPAROTOMY;  Surgeon: Ebbie Cough, MD;  Location: [facility] OR;  Service: General;  Laterality: N/A;   LEFT AND RIGHT HEART CATHETERIZATION WITH CORONARY ANGIOGRAM N/A 06/11/2011   Procedure: LEFT AND RIGHT HEART CATHETERIZATION WITH CORONARY ANGIOGRAM;  Surgeon: Ezra GORMAN Shuck, MD;  Location: [facility] CATH LAB;  Service: Cardiovascular;  Laterality: N/A;   PERIPHERAL VASCULAR BALLOON ANGIOPLASTY Right 07/29/2020   Procedure: PERIPHERAL VASCULAR BALLOON ANGIOPLASTY;  Surgeon: Eliza Lonni GORMAN, MD;  Location: [facility] INVASIVE CV LAB;  Service: Cardiovascular;  Laterality: Right;  arm fistula   REMOVAL OF STONES  05/17/2022   Procedure: REMOVAL OF STONES;  Surgeon: Wilhelmenia Aloha Raddle., MD;  Location: [facility] ENDOSCOPY;  Service: Gastroenterology;;   REVISON OF ARTERIOVENOUS FISTULA Right 08/08/2020   Procedure: ANEURYSM EXCISION OF RIGHT UPPER EXTREMITY ARTERIOVENOUS FISTULA;  Surgeon: Eliza Lonni GORMAN, MD;  Location: [facility] OR;  Service: Vascular;  Laterality: Right;   REVISON OF ARTERIOVENOUS FISTULA Right 03/21/2022   Procedure: EXCISION OF ULCERATED SKIN OF RIGHT ARTERIOVENOUS FISTULA REVISION;  Surgeon: Gretta Lonni PARAS, MD;  Location: [facility] OR;  Service: Vascular;  Laterality: Right;   REVISON OF ARTERIOVENOUS FISTULA Right 07/02/2022   Procedure: EXCISION OF ULCERS RIGHT ARTERIOVENOUS FISTULA;  Surgeon: Eliza Lonni GORMAN, MD;  Location: [facility] OR;  Service: Vascular;  Laterality: Right;   SCLEROTHERAPY  05/29/2021   Procedure: MATIAS;  Surgeon: Teressa Toribio SQUIBB, MD;  Location: [facility] ENDOSCOPY;  Service: Gastroenterology;;   ANNETT  04/04/2022   Procedure: SPHINCTEROTOMY;  Surgeon: Aneita Gwendlyn DASEN, MD;  Location: [facility] ENDOSCOPY;  Service: Gastroenterology;;   CLEDA REMOVAL  05/29/2021   Procedure: STENT REMOVAL;  Surgeon: Teressa Toribio SQUIBB, MD;  Location: [facility] ENDOSCOPY;  Service: Gastroenterology;;   CLEDA REMOVAL  05/17/2022   Procedure: STENT REMOVAL;  Surgeon: Wilhelmenia Aloha Raddle., MD;  Location: [facility] ENDOSCOPY;  Service: Gastroenterology;;   TEE WITHOUT CARDIOVERSION N/A 12/27/2015   Procedure: TRANSESOPHAGEAL ECHOCARDIOGRAM (TEE);  Surgeon: Wilbert JONELLE Bihari, MD;  Location: [facility] ENDOSCOPY;  Service: Cardiovascular;  Laterality: N/A;   UPPER ESOPHAGEAL ENDOSCOPIC ULTRASOUND (EUS) N/A 04/02/2022   Procedure: UPPER ESOPHAGEAL ENDOSCOPIC ULTRASOUND (EUS);  Surgeon: Wilhelmenia Aloha Raddle., MD;  Location: [facility] ENDOSCOPY;  Service: Gastroenterology;  Laterality: N/A;   Patient Active Problem List   Diagnosis Date Noted   Bell's palsy 06/25/2023   Pain in right knee 02/06/2023   Hospital discharge follow-up 12/04/2022   Sickle cell pain crisis (HCC) 11/21/2022   GI bleed 11/20/2022   Cirrhosis (HCC) 10/21/2022   Hypotension 10/21/2022   Thrombocytopenia (HCC) 10/21/2022   Wound infection 10/02/2022   Legally blind 10/02/2022   Atrial fibrillation with RVR (HCC) 07/23/2022   Hypoglycemia 07/23/2022   Cystic lesion of abdominal viscera 04/06/2022   Bile leak    Cholelithiasis 11/30/2021   Chronic pain syndrome    Persistent atrial fibrillation (HCC) 09/13/2021   Symptomatic anemia 09/13/2021   Abdominal fluid collection 08/22/2021   Gastric AVM    Hiatal hernia    History of GI bleed 06/27/2021   Visual impairment 06/27/2021  COPD (chronic obstructive pulmonary disease) (HCC) 06/27/2021   Acute gangrenous cholecystitis s/p open cholcystectomy 05/24/2021 05/27/2021   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 05/24/2021 05/27/2021   Protein-calorie malnutrition, moderate (HCC) 05/23/2021   Prolonged QT interval 08/02/2020   Chronic anticoagulation    Tobacco dependence 05/28/2020   Chronic systolic CHF (congestive heart failure) (HCC) 09/11/2019   Encounter for therapeutic drug monitoring 12/29/2015    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   Permanent atrial fibrillation (HCC) 12/24/2015   ESRD (end stage renal disease) (HCC) 05/06/2014   Vitamin D  deficiency 09/25/2013   CKD (chronic kidney disease), stage IV (HCC) 09/25/2013   Onychomycosis of toenail 08/27/2013   Sickle cell disease (HCC) 11/21/2012   Anemia 01/17/2012   Sickle cell crisis (HCC) 01/17/2012   NSVT (nonsustained ventricular tachycardia) (HCC) 10/21/2011   Gout 10/19/2011    PCP: Jegede, Olugbemiga E, MD  REFERRING PROVIDER: Forest Coy, MD  REFERRING DIAG: [phone] (ICD-10-CM) - Chronic pain of right knee  THERAPY DIAG:  Chronic pain of right knee  Other symptoms and signs involving the musculoskeletal system  Muscle weakness (generalized)  Unsteadiness on feet  Rationale for Evaluation and Treatment: Rehabilitation  ONSET DATE: 1 year ago  SUBJECTIVE:   SUBJECTIVE STATEMENT: Cheryl  Pt returns to PT after missing initial 2 scheduled treatments due to recent diagnosis of Bell's Palsy and sickle cell crisis without admission.  He reports his Bell's Palsy is causing him some discomfort and excessive tearing on the right eye.  He denies recent falls or any other acute changes.  He saw the neurologist 1/7.    PERTINENT HISTORY: Afib, CHF, CKD w/ R AV fistula (Tues/Th/Sat dialysis), legally blind, Sickle cell disease type SS w/o crisis  PAIN:  Are you having pain? Yes: NPRS scale: 3-4 Pain location: right face/jaw below ear Pain description: pressure Aggravating factors: trying to eat Relieving factors: unsure  PRECAUTIONS: Fall and Other: R AV fistula  RED FLAGS: None   WEIGHT BEARING RESTRICTIONS: No  FALLS:  Has patient fallen in last 6 months? 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  DIAGNOSTIC FINDINGS:  Right Knee x-ray 11/02/2022: IMPRESSION: 1. Mild-to-moderate tricompartmental degenerative changes. 2. Moderate suprapatellar joint effusion.  MRI of brain showed no acute abnormality, moderate small vessel disease evidence of chronic left MCA stroke   PATIENT SURVEYS:  KOOS:  27%  COGNITION: Overall cognitive status: Within functional limits for tasks assessed     SENSATION: Light touch: WFL  EDEMA:  Suprapatellar edema worse than infrapatellar  POSTURE: rounded shoulders, forward head, increased thoracic kyphosis, and flexed trunk   PALPATION: Tender w/ kneecap movements all directions, none worse  LOWER EXTREMITY ROM:  Active ROM Right eval Left eval  Hip flexion Grossly WFL.  Hip extension   Hip abduction   Hip adduction   Hip internal rotation   Hip external rotation   Knee flexion   Knee extension   Ankle dorsiflexion   Ankle plantarflexion    Ankle inversion    Ankle eversion     (Blank rows =  not tested)  LOWER EXTREMITY MMT:  MMT Right eval Left eval  Hip flexion 4-/5 4/5  Hip extension    Hip abduction 4-/5 4-/5  Hip adduction    Hip internal rotation    Hip external rotation    Knee flexion 4/5 4/5  Knee extension 4-/5 4/5  Ankle dorsiflexion 4+/5 4+/5  Ankle plantarflexion    Ankle inversion    Ankle eversion     (Blank rows = not tested)  LOWER EXTREMITY SPECIAL TESTS:  Knee special tests: pt too tender to palpation  FUNCTIONAL TESTS:  5 times sit to stand: 20.09 seconds w/ BUE support 2 minute walk test: To be assessed.  GAIT: Distance walked: Various clinic distances Assistive device utilized: None Level of assistance: Complete  Independence Comments: Mild crouching of gait, no marked LOB or instability noted.  Pt sometimes uses a visual aide cane.   TODAY'S TREATMENT:                                                                                                                              DATE: 07/01/2023 -Pt to discuss referral for bell's palsy with neurologist if he desires facial exercises in future. - :  247 ft SBA, severe bilateral knee valgus and mild shuffling -Reviewed PRICE principles - see patient instructions.  Pt cannot read at current due to visual changes, but has an app he can scan printouts and it reads it to him. -Printed HEP w/ review of 4 way patellar glides and CW/CCW circular sweeping for edema management -Side-lying clamshells x15 each side -STS 2x10; added red theraband on second set for progressed glut med engagement to prevent knee valgus collapse  PATIENT EDUCATION:  Education details: PRICE principles, HEP and patellar glides.  Outcome interpretation w/ goal set. Person educated: Patient Education method: Explanation Education comprehension: verbalized understanding  HOME EXERCISE PROGRAM: PRICE Principles.  Access Code: [email or web address] URL: [email or web address] Date: 07/01/2023 Prepared by: Daved Bull  Exercises - 4 Way Patellar Glide  - 1 x daily - 7 x weekly - 3 sets - 10 reps - Clamshell  - 1 x daily - 4 x weekly - 3 sets - 10 reps - Sit to Stand with Resistance Around Legs  - 1 x daily - 4 x weekly - 2-3 sets - 10 reps  ASSESSMENT:  CLINICAL IMPRESSION: Focus of skilled PT session today on addressing changes since evaluation as pt has had medical setbacks without hospitalization.  He has new onset Bell's Palsy with discovery of chronic left MCA CVA and recent sickle cell crisis.  He still wants to address his knee though his facial pain is more bothersome at current.  He may return to clinic with referral for facial exercises as desired, he is to think  about this if making no progress on his own.  He continues to benefit from skilled PT to promote edema management of chronic knee pain and valgus collapse on bilateral knees creating instability  and increased standing strain.  Will continue per POC.  OBJECTIVE IMPAIRMENTS: decreased activity tolerance, difficulty walking, decreased strength, increased edema, improper body mechanics, and pain.   ACTIVITY LIMITATIONS: lifting, squatting, stairs, and locomotion level  PARTICIPATION LIMITATIONS: community activity and occupation  PERSONAL FACTORS: Age, Fitness, Time since onset of injury/illness/exacerbation, and 1-2 comorbidities: dialysis and sickle cell disease  are also affecting patient's functional outcome.   REHAB POTENTIAL: Good  CLINICAL DECISION MAKING: Evolving/moderate complexity  EVALUATION COMPLEXITY: Moderate   GOALS: Goals reviewed with patient? Yes  SHORT TERM GOALS: Target date: 07/05/2023 Pt will be independent and compliant with initial LE pain management, stretching, strength and balance focused HEP in order to maintain functional progress and improve mobility. Baseline:  To be established. Goal status: INITIAL  2.  Pt will decrease 5xSTS to </=16 seconds in order to demonstrate decreased risk for falls and improved functional bilateral LE strength and power. Baseline: 20.09 sec BUE support Goal status: INITIAL  3.  Pt will be independent in management of patellar edema using PRICE principles and education on mobilization techniques. Baseline: To be reviewed. Goal status: INITIAL  LONG TERM GOALS: Target date: 08/02/2023  Pt will be independent and compliant with advanced and finalized LE pain management, stretching, strength and balance focused HEP in order to maintain functional progress and improve mobility. Baseline: To be established. Goal status: INITIAL  2.  Pt will improve KOOS score to >/=47% to indicate improved pain management, engagement in normal  activity, and improved quality of life. Baseline: 27% Goal status: INITIAL  3.  Pt will decrease 5xSTS to </=12 seconds in order to demonstrate decreased risk for falls and improved functional bilateral LE strength and power. Baseline: 20.09 sec BUE support Goal status: INITIAL  4.  Pt will ambulate>/=350 feet on to demonstrate improved endurance for functional tasks in home and community. Baseline: 247 ft SBA Goal status: INITIAL  PLAN:  PT FREQUENCY: 1x/week  PT DURATION: 8 weeks  PLANNED INTERVENTIONS: [id number]- PT Re-evaluation, 97110-Therapeutic exercises, 97530- Therapeutic activity, 97112- Neuromuscular re-education, 97535- Self Care, [id number]- Manual therapy, [phone]- Gait training, Patient/Family education, Balance training, Stair training, Taping, Joint mobilization, Compression bandaging, Cryotherapy, and Moist heat  PLAN FOR NEXT SESSION: Add to HEP for self-mobilization, stretching, and strengthening of the BLE - glut med strength.  Try SciFit for strength and edema management?     Daved KATHEE Bull, PT, DPT 07/01/2023, 4:22 PM

## 2023-07-02 ENCOUNTER — Other Ambulatory Visit: Payer: Self-pay | Admitting: Internal Medicine

## 2023-07-02 DIAGNOSIS — Z992 Dependence on renal dialysis: Secondary | ICD-10-CM | POA: Diagnosis not present

## 2023-07-02 DIAGNOSIS — D57819 Other sickle-cell disorders with crisis, unspecified: Secondary | ICD-10-CM | POA: Diagnosis not present

## 2023-07-02 DIAGNOSIS — D631 Anemia in chronic kidney disease: Secondary | ICD-10-CM | POA: Diagnosis not present

## 2023-07-02 DIAGNOSIS — D5700 Hb-SS disease with crisis, unspecified: Secondary | ICD-10-CM | POA: Diagnosis not present

## 2023-07-02 DIAGNOSIS — D571 Sickle-cell disease without crisis: Secondary | ICD-10-CM | POA: Diagnosis not present

## 2023-07-02 DIAGNOSIS — D57219 Sickle-cell/Hb-C disease with crisis, unspecified: Secondary | ICD-10-CM | POA: Diagnosis not present

## 2023-07-02 DIAGNOSIS — D509 Iron deficiency anemia, unspecified: Secondary | ICD-10-CM | POA: Diagnosis not present

## 2023-07-02 DIAGNOSIS — G894 Chronic pain syndrome: Secondary | ICD-10-CM

## 2023-07-02 DIAGNOSIS — N2581 Secondary hyperparathyroidism of renal origin: Secondary | ICD-10-CM | POA: Diagnosis not present

## 2023-07-02 DIAGNOSIS — N186 End stage renal disease: Secondary | ICD-10-CM | POA: Diagnosis not present

## 2023-07-02 DIAGNOSIS — L299 Pruritus, unspecified: Secondary | ICD-10-CM | POA: Diagnosis not present

## 2023-07-03 DIAGNOSIS — H3523 Other non-diabetic proliferative retinopathy, bilateral: Secondary | ICD-10-CM | POA: Diagnosis not present

## 2023-07-03 DIAGNOSIS — H35053 Retinal neovascularization, unspecified, bilateral: Secondary | ICD-10-CM | POA: Diagnosis not present

## 2023-07-03 DIAGNOSIS — H3341 Traction detachment of retina, right eye: Secondary | ICD-10-CM | POA: Diagnosis not present

## 2023-07-03 DIAGNOSIS — H02233 Paralytic lagophthalmos right eye, unspecified eyelid: Secondary | ICD-10-CM | POA: Diagnosis not present

## 2023-07-08 ENCOUNTER — Ambulatory Visit: Payer: Self-pay | Admitting: Internal Medicine

## 2023-07-08 ENCOUNTER — Ambulatory Visit: Payer: PPO | Admitting: Physical Therapy

## 2023-07-08 ENCOUNTER — Telehealth: Payer: Self-pay | Admitting: Internal Medicine

## 2023-07-08 ENCOUNTER — Encounter: Payer: Self-pay | Admitting: Physical Therapy

## 2023-07-08 DIAGNOSIS — G8929 Other chronic pain: Secondary | ICD-10-CM

## 2023-07-08 DIAGNOSIS — M25561 Pain in right knee: Secondary | ICD-10-CM | POA: Diagnosis not present

## 2023-07-08 DIAGNOSIS — R29898 Other symptoms and signs involving the musculoskeletal system: Secondary | ICD-10-CM

## 2023-07-08 DIAGNOSIS — M6281 Muscle weakness (generalized): Secondary | ICD-10-CM

## 2023-07-08 DIAGNOSIS — R2681 Unsteadiness on feet: Secondary | ICD-10-CM

## 2023-07-08 NOTE — Therapy (Signed)
OUTPATIENT PHYSICAL THERAPY LOWER EXTREMITY TREATMENT   Patient Name: Patrick [name] MRN: [id number] DOB:12/19/1955, 68 y.o., male Today's Date: 07/08/2023  END OF SESSION:  PT End of Session - 07/08/23 1525     Visit Number 3    Number of Visits 9   8 + eval   Date for PT Re-Evaluation 08/09/23   pushed out due to holiday scheduling   Authorization Type [employer]    PT Start Time 1520    PT Stop Time 1607    PT Time Calculation (min) 47 min    Equipment Utilized During Treatment Gait belt    Activity Tolerance Patient tolerated treatment well    Behavior During Therapy WFL for tasks assessed/performed            Past Medical History:  Diagnosis Date   A-fib (HCC)    Blood dyscrasia    Blood transfusion    "I've had a bunch of them"   CHF (congestive heart failure) (HCC)    followed by hf clinic   Chronic kidney disease    Stage 5, dialysis on Tu/Th/Sa   Dialysis patient (HCC) 04/2019   AV fistula (right arm), TTS   Dysrhythmia    A-Fib per patient   Early satiety    Elevated LFTs 04/01/2022   Elevated troponin 04/01/2022   Empyema of gallbladder 05/27/2021   GERD (gastroesophageal reflux disease)    Gout    Legally blind    Metabolic acidosis with increased anion gap and accumulation of organic acids 04/01/2022   Mitral regurgitation    Muscle tightness-Right arm  05/27/2014   Pneumonia    Pulmonary hypertension (HCC)    Shortness of breath dyspnea    Sickle cell disease, type SS (HCC)    Swelling abdomen    Swelling of extremity, left    Swelling of extremity, right    Tricuspid regurgitation    Past Surgical History:  Procedure Laterality Date   A/V FISTULAGRAM Right 07/29/2020   Procedure: A/V FISTULAGRAM - Right Arm;  Surgeon: Chuck Hint, MD;  Location: [facility] INVASIVE CV LAB;  Service: Cardiovascular;  Laterality: Right;   BASCILIC VEIN TRANSPOSITION Right 04/12/2014   Procedure: BASILIC VEIN TRANSPOSITION;  Surgeon:  Sherren Kerns, MD;  Location: [facility] OR;  Service: Vascular;  Laterality: Right;   BILIARY STENT PLACEMENT  04/04/2022   Procedure: BILIARY STENT PLACEMENT;  Surgeon: Meryl Dare, MD;  Location: [facility] ENDOSCOPY;  Service: Gastroenterology;;   BIOPSY  08/04/2020   Procedure: BIOPSY;  Surgeon: Meryl Dare, MD;  Location: [facility] ENDOSCOPY;  Service: Gastroenterology;;   BIOPSY  06/28/2021   Procedure: BIOPSY;  Surgeon: Beverley Fiedler, MD;  Location: [facility] ENDOSCOPY;  Service: Gastroenterology;;   BIOPSY  04/02/2022   Procedure: BIOPSY;  Surgeon: Lemar Lofty., MD;  Location: [facility] ENDOSCOPY;  Service: Gastroenterology;;   BIOPSY  10/22/2022   Procedure: BIOPSY;  Surgeon: Beverley Fiedler, MD;  Location: [facility] ENDOSCOPY;  Service: Gastroenterology;;   BOWEL RESECTION N/A 05/24/2021   Procedure: SMALL BOWEL RESECTION;  Surgeon: Emelia Loron, MD;  Location: [facility] OR;  Service: General;  Laterality: N/A;   CARDIOVERSION N/A 06/05/2018   Procedure: CARDIOVERSION;  Surgeon: Dolores Patty, MD;  Location: [facility] ENDOSCOPY;  Service: Cardiovascular;  Laterality: N/A;   CARDIOVERSION N/A 10/06/2019   Procedure: CARDIOVERSION;  Surgeon: Dolores Patty, MD;  Location: [facility] ENDOSCOPY;  Service: Cardiovascular;  Laterality: N/A;   CHOLECYSTECTOMY N/A 05/24/2021   Procedure: OPEN CHOLECYSTECTOMY;  Surgeon: Emelia Loron, MD;  Location[facility] OR;  Service: General;  Laterality: N/A;   COLONOSCOPY WITH PROPOFOL N/A 09/24/2017   Procedure: COLONOSCOPY WITH PROPOFOL;  Surgeon: Lynann Bologna, MD;  Location: [facility] ENDOSCOPY;  Service: Endoscopy;  Laterality: N/A;   ENDOSCOPIC RETROGRADE CHOLANGIOPANCREATOGRAPHY (ERCP) WITH PROPOFOL N/A 05/17/2022   Procedure: ENDOSCOPIC RETROGRADE CHOLANGIOPANCREATOGRAPHY (ERCP) WITH PROPOFOL;  Surgeon: Meridee Score Netty Starring., MD;  Location: [facility] ENDOSCOPY;  Service: Gastroenterology;  Laterality: N/A;   ERCP N/A 04/04/2022   Procedure: ENDOSCOPIC RETROGRADE  CHOLANGIOPANCREATOGRAPHY (ERCP);  Surgeon: Meryl Dare, MD;  Location: [facility] ENDOSCOPY;  Service: Gastroenterology;  Laterality: N/A;   ESOPHAGOGASTRODUODENOSCOPY N/A 09/19/2017   Procedure: ESOPHAGOGASTRODUODENOSCOPY (EGD);  Surgeon: Lynann Bologna, MD;  Location: [facility] ENDOSCOPY;  Service: Endoscopy;  Laterality: N/A;   ESOPHAGOGASTRODUODENOSCOPY N/A 05/29/2021   Procedure: ESOPHAGOGASTRODUODENOSCOPY (EGD);  Surgeon: Rachael Fee, MD;  Location: [facility] ENDOSCOPY;  Service: Gastroenterology;  Laterality: N/A;   ESOPHAGOGASTRODUODENOSCOPY (EGD) WITH PROPOFOL N/A 08/04/2020   Procedure: ESOPHAGOGASTRODUODENOSCOPY (EGD) WITH PROPOFOL;  Surgeon: Meryl Dare, MD;  Location: [facility] ENDOSCOPY;  Service: Gastroenterology;  Laterality: N/A;   ESOPHAGOGASTRODUODENOSCOPY (EGD) WITH PROPOFOL N/A 06/28/2021   Procedure: ESOPHAGOGASTRODUODENOSCOPY (EGD) WITH PROPOFOL;  Surgeon: Beverley Fiedler, MD;  Location: [facility] ENDOSCOPY;  Service: Gastroenterology;  Laterality: N/A;   ESOPHAGOGASTRODUODENOSCOPY (EGD) WITH PROPOFOL N/A 08/10/2021   Procedure: ESOPHAGOGASTRODUODENOSCOPY (EGD) WITH PROPOFOL;  Surgeon: Lynann Bologna, MD;  Location: [facility] ENDOSCOPY;  Service: Gastroenterology;  Laterality: N/A;   ESOPHAGOGASTRODUODENOSCOPY (EGD) WITH PROPOFOL N/A 08/02/2021   Procedure: ESOPHAGOGASTRODUODENOSCOPY (EGD) WITH PROPOFOL;  Surgeon: Shellia Cleverly, DO;  Location: [facility] ENDOSCOPY;  Service: Gastroenterology;  Laterality: N/A;   ESOPHAGOGASTRODUODENOSCOPY (EGD) WITH PROPOFOL N/A 04/02/2022   Procedure: ESOPHAGOGASTRODUODENOSCOPY (EGD) WITH PROPOFOL;  Surgeon: Meridee Score Netty Starring., MD;  Location: [facility] ENDOSCOPY;  Service: Gastroenterology;  Laterality: N/A;   ESOPHAGOGASTRODUODENOSCOPY (EGD) WITH PROPOFOL N/A 05/17/2022   Procedure: ESOPHAGOGASTRODUODENOSCOPY (EGD) WITH PROPOFOL;  Surgeon: Meridee Score Netty Starring., MD;  Location: [facility] ENDOSCOPY;  Service: Gastroenterology;  Laterality: N/A;   ESOPHAGOGASTRODUODENOSCOPY (EGD) WITH  PROPOFOL N/A 10/22/2022   Procedure: ESOPHAGOGASTRODUODENOSCOPY (EGD) WITH PROPOFOL;  Surgeon: Beverley Fiedler, MD;  Location: [facility] ENDOSCOPY;  Service: Gastroenterology;  Laterality: N/A;   EUS N/A 05/17/2022   Procedure: UPPER ENDOSCOPIC ULTRASOUND (EUS) RADIAL;  Surgeon: Lemar Lofty., MD;  Location: [facility] ENDOSCOPY;  Service: Gastroenterology;  Laterality: N/A;   FINE NEEDLE ASPIRATION  04/02/2022   Procedure: FINE NEEDLE ASPIRATION (FNA) LINEAR;  Surgeon: Lemar Lofty., MD;  Location: [facility] ENDOSCOPY;  Service: Gastroenterology;;   FINE NEEDLE ASPIRATION N/A 05/17/2022   Procedure: FINE NEEDLE ASPIRATION (FNA) LINEAR;  Surgeon: Lemar Lofty., MD;  Location: [facility] ENDOSCOPY;  Service: Gastroenterology;  Laterality: N/A;   HEMOSTASIS CLIP PLACEMENT  05/29/2021   Procedure: HEMOSTASIS CLIP PLACEMENT;  Surgeon: Rachael Fee, MD;  Location: [facility] ENDOSCOPY;  Service: Gastroenterology;;   HOT HEMOSTASIS N/A 05/29/2021   Procedure: HOT HEMOSTASIS (ARGON PLASMA COAGULATION/BICAP);  Surgeon: Rachael Fee, MD;  Location: [facility] ENDOSCOPY;  Service: Gastroenterology;  Laterality: N/A;   HOT HEMOSTASIS N/A 06/28/2021   Procedure: HOT HEMOSTASIS (ARGON PLASMA COAGULATION/BICAP);  Surgeon: Beverley Fiedler, MD;  Location: [facility] ENDOSCOPY;  Service: Gastroenterology;  Laterality: N/A;   HOT HEMOSTASIS N/A 08/10/2021   Procedure: HOT HEMOSTASIS (ARGON PLASMA COAGULATION/BICAP);  Surgeon: Lynann Bologna, MD;  Location: [facility] ENDOSCOPY;  Service: Gastroenterology;  Laterality: N/A;   HOT HEMOSTASIS N/A 08/02/2021   Procedure: HOT HEMOSTASIS (ARGON PLASMA COAGULATION/BICAP);  Surgeon: Shellia Cleverly, DO;  Location: [facility]  ENDOSCOPY;  Service: Gastroenterology;  Laterality: N/A;   IR PARACENTESIS  07/04/2022   IR PERC TUN PERIT CATH WO PORT S&I /IMAG  05/23/2021   IR REMOVAL TUN CV CATH W/O FL  06/10/2021   IR US GUIDE VASC ACCESS RIGHT  05/23/2021   LAPAROSCOPY N/A 05/24/2021   Procedure: LAPAROSCOPY  DIAGNOSTIC;  Surgeon: Emelia Loron, MD;  Location: [facility] OR;  Service: General;  Laterality: N/A;   LAPAROTOMY N/A 05/24/2021   Procedure: EXPLORATORY LAPAROTOMY;  Surgeon: Emelia Loron, MD;  Location: [facility] OR;  Service: General;  Laterality: N/A;   LEFT AND RIGHT HEART CATHETERIZATION WITH CORONARY ANGIOGRAM N/A 06/11/2011   Procedure: LEFT AND RIGHT HEART CATHETERIZATION WITH CORONARY ANGIOGRAM;  Surgeon: Laurey Morale, MD;  Location: [facility] CATH LAB;  Service: Cardiovascular;  Laterality: N/A;   PERIPHERAL VASCULAR BALLOON ANGIOPLASTY Right 07/29/2020   Procedure: PERIPHERAL VASCULAR BALLOON ANGIOPLASTY;  Surgeon: Chuck Hint, MD;  Location: [facility] INVASIVE CV LAB;  Service: Cardiovascular;  Laterality: Right;  arm fistula   REMOVAL OF STONES  05/17/2022   Procedure: REMOVAL OF STONES;  Surgeon: Meridee Score Netty Starring., MD;  Location: [facility] ENDOSCOPY;  Service: Gastroenterology;;   REVISON OF ARTERIOVENOUS FISTULA Right 08/08/2020   Procedure: ANEURYSM EXCISION OF RIGHT UPPER EXTREMITY ARTERIOVENOUS FISTULA;  Surgeon: Chuck Hint, MD;  Location: [facility] OR;  Service: Vascular;  Laterality: Right;   REVISON OF ARTERIOVENOUS FISTULA Right 03/21/2022   Procedure: EXCISION OF ULCERATED SKIN OF RIGHT ARTERIOVENOUS FISTULA REVISION;  Surgeon: Cephus Shelling, MD;  Location: [facility] OR;  Service: Vascular;  Laterality: Right;   REVISON OF ARTERIOVENOUS FISTULA Right 07/02/2022   Procedure: EXCISION OF ULCERS RIGHT ARTERIOVENOUS FISTULA;  Surgeon: Chuck Hint, MD;  Location: [facility] OR;  Service: Vascular;  Laterality: Right;   SCLEROTHERAPY  05/29/2021   Procedure: Susa Day;  Surgeon: Rachael Fee, MD;  Location: [facility] ENDOSCOPY;  Service: Gastroenterology;;   Dennison Mascot  04/04/2022   Procedure: SPHINCTEROTOMY;  Surgeon: Meryl Dare, MD;  Location: [facility] ENDOSCOPY;  Service: Gastroenterology;;   Francine Graven REMOVAL  05/29/2021   Procedure: STENT REMOVAL;  Surgeon: Rachael Fee, MD;  Location: [facility] ENDOSCOPY;  Service: Gastroenterology;;   Francine Graven REMOVAL  05/17/2022   Procedure: STENT REMOVAL;  Surgeon: Lemar Lofty., MD;  Location: [facility] ENDOSCOPY;  Service: Gastroenterology;;   TEE WITHOUT CARDIOVERSION N/A 12/27/2015   Procedure: TRANSESOPHAGEAL ECHOCARDIOGRAM (TEE);  Surgeon: Quintella Reichert, MD;  Location: [facility] ENDOSCOPY;  Service: Cardiovascular;  Laterality: N/A;   UPPER ESOPHAGEAL ENDOSCOPIC ULTRASOUND (EUS) N/A 04/02/2022   Procedure: UPPER ESOPHAGEAL ENDOSCOPIC ULTRASOUND (EUS);  Surgeon: Lemar Lofty., MD;  Location: [facility] ENDOSCOPY;  Service: Gastroenterology;  Laterality: N/A;   Patient Active Problem List   Diagnosis Date Noted   Bell's palsy 06/25/2023   Pain in right knee 02/06/2023   Hospital discharge follow-up 12/04/2022   Sickle cell pain crisis (HCC) 11/21/2022   GI bleed 11/20/2022   Cirrhosis (HCC) 10/21/2022   Hypotension 10/21/2022   Thrombocytopenia (HCC) 10/21/2022   Wound infection 10/02/2022   Legally blind 10/02/2022   Atrial fibrillation with RVR (HCC) 07/23/2022   Hypoglycemia 07/23/2022   Cystic lesion of abdominal viscera 04/06/2022   Bile leak    Cholelithiasis 11/30/2021   Chronic pain syndrome    Persistent atrial fibrillation (HCC) 09/13/2021   Symptomatic anemia 09/13/2021   Abdominal fluid collection 08/22/2021   Gastric AVM    Hiatal hernia    History of GI bleed 06/27/2021   Visual impairment 06/27/2021  COPD (chronic obstructive pulmonary disease) (HCC) 06/27/2021   Acute gangrenous cholecystitis s/p open cholcystectomy 05/24/2021 05/27/2021   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 05/24/2021 05/27/2021   Protein-calorie malnutrition, moderate (HCC) 05/23/2021   Prolonged QT interval 08/02/2020   Chronic anticoagulation    Tobacco dependence 05/28/2020   Chronic systolic CHF (congestive heart failure) (HCC) 09/11/2019   Encounter for therapeutic drug monitoring 12/29/2015    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   Permanent atrial fibrillation (HCC) 12/24/2015   ESRD (end stage renal disease) (HCC) 05/06/2014   Vitamin D deficiency 09/25/2013   CKD (chronic kidney disease), stage IV (HCC) 09/25/2013   Onychomycosis of toenail 08/27/2013   Sickle cell disease (HCC) 11/21/2012   Anemia 01/17/2012   Sickle cell crisis (HCC) 01/17/2012   NSVT (nonsustained ventricular tachycardia) (HCC) 10/21/2011   Gout 10/19/2011    PCP: Quentin Angst, MD  REFERRING PROVIDER: Reymundo Poll, MD  REFERRING DIAG: [phone] (ICD-10-CM) - Chronic pain of right knee  THERAPY DIAG:  Chronic pain of right knee  Other symptoms and signs involving the musculoskeletal system  Muscle weakness (generalized)  Unsteadiness on feet  Rationale for Evaluation and Treatment: Rehabilitation  ONSET DATE: 1 year ago  SUBJECTIVE:   SUBJECTIVE STATEMENT: "Amada Jupiter"  Pt is still bothered by Bell's Palsy diagnosis and ongoing deficits as his eye is now too sensitive to light resulting in need for eye patch.  He denies falls and reports ongoing discomfort noted in session prior.    PERTINENT HISTORY: Afib, CHF, CKD w/ R AV fistula (Tues/Th/Sat dialysis), legally blind, Sickle cell disease type SS w/o crisis  PAIN:  Are you having pain? Yes: NPRS scale: 3-4 Pain location: right face/jaw below ear Pain description: pressure Aggravating factors: trying to eat Relieving factors: unsure  PRECAUTIONS: Fall and Other: R AV fistula  RED FLAGS: None   WEIGHT BEARING RESTRICTIONS: No  FALLS:  Has patient fallen in last 6 months? 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  DIAGNOSTIC FINDINGS:  Right Knee x-ray 11/02/2022: IMPRESSION: 1. Mild-to-moderate tricompartmental degenerative changes. 2. Moderate suprapatellar joint effusion.  MRI of brain showed no acute abnormality, moderate small vessel disease evidence of chronic left MCA stroke   PATIENT SURVEYS:  KOOS:  27%  COGNITION: Overall cognitive status: Within functional limits for tasks assessed     SENSATION: Light touch: WFL  EDEMA:  Suprapatellar edema worse than infrapatellar  POSTURE: rounded shoulders, forward head, increased thoracic kyphosis, and flexed trunk   PALPATION: Tender w/ kneecap movements all directions, none worse  LOWER EXTREMITY ROM:  Active ROM Right eval Left eval  Hip flexion Grossly WFL.  Hip extension   Hip abduction   Hip adduction   Hip internal rotation   Hip external rotation   Knee flexion   Knee extension   Ankle dorsiflexion   Ankle plantarflexion    Ankle inversion    Ankle eversion     (Blank rows = not tested)  LOWER EXTREMITY MMT:  MMT Right eval Left eval  Hip flexion 4-/5 4/5  Hip extension    Hip abduction 4-/5 4-/5  Hip adduction    Hip internal rotation    Hip external rotation    Knee flexion 4/5 4/5  Knee extension 4-/5 4/5  Ankle dorsiflexion 4+/5 4+/5  Ankle plantarflexion    Ankle inversion    Ankle eversion     (Blank rows = not tested)  LOWER EXTREMITY SPECIAL TESTS:  Knee special tests: pt too tender to palpation  FUNCTIONAL TESTS:  5 times sit to stand: 20.09 seconds w/ BUE support 2 minute walk test: To be assessed.  GAIT: Distance walked: Various clinic distances Assistive device utilized: None Level of assistance: Complete Independence Comments: Mild crouching of gait, no marked LOB or instability noted.  Pt sometimes uses a visual aide  cane.   TODAY'S TREATMENT:                                                                                                                              DATE: 07/08/2023 -PT using flexible tape to right knee for edema management - mild medial/suprapatellar swelling noted today.  Discussed process and goal of taping.  Confirmed pt has no known adhesive allergies and what to do if noting signs of intolerance. -SciFit x8 minutes using BLE for initial 2 minutes, then pt needing BUE for remaining time progressing to level 4.0 for global strengthening and large amplitude ROM and potentially edema management of BLE.  RPE 3-4/10. -Seated hamstring stretch 2x45 sec each LE -Seated calf stretch w/ strap 2x45 sec each LE -Supine bridges 2x10; pt mildly light-headed following return to EOM - requested water w/ symptoms passing in </=1 minute of upright sitting, pt did not want BP assessed as symptoms passed -Lateral stepping w/ red band 3x91ft each direction; pt has some right shin pain so regressed to no band - he reports wanting to try both at home  PATIENT EDUCATION:  Education details: Taping for edema.  Outcome interpretation w/ goal set.  Additions to HEP. Person educated: Patient Education method: Explanation Education comprehension: verbalized understanding  HOME EXERCISE PROGRAM: PRICE Principles.  Access Code: [email or web address] URL: [email or web address] Date: 07/08/2023 Prepared by: Camille Bal  Exercises - 4 Way Patellar Glide  - 1 x daily - 7 x weekly - 3 sets - 10 reps - Clamshell  - 1 x daily - 4 x weekly - 3 sets - 10 reps - Sit to Stand with Resistance Around Legs  - 1 x daily - 4 x weekly - 2-3 sets - 10 reps - Long Sitting Calf Stretch with Strap  - 1 x daily - 4 x weekly - 1 sets - 3 reps - 45 seconds hold - Seated Hamstring Stretch  - 1 x daily - 4 x weekly - 1 sets - 3 reps - 45 seconds hold - Supine Bridge  - 1 x daily - 4 x weekly - 2 sets - 10 reps - Side  Stepping with Counter Support  -  1 x daily - 4 x weekly - 3 sets - 10 reps - Side Stepping with Resistance at Thighs and Counter Support  - 1 x daily - 4 x weekly - 3 sets - 10 reps  ASSESSMENT:  CLINICAL IMPRESSION: Focus of skilled PT session today on edema management and general LE strengthening.  Pt had a minor passing episode of light-headedness following positional change from supine to sitting EOM.  He otherwise tolerated all challenges well today with additions made to advance HEP.  Will continue to address deficits as outlined in ongoing POC.  OBJECTIVE IMPAIRMENTS: decreased activity tolerance, difficulty walking, decreased strength, increased edema, improper body mechanics, and pain.   ACTIVITY LIMITATIONS: lifting, squatting, stairs, and locomotion level  PARTICIPATION LIMITATIONS: community activity and occupation  PERSONAL FACTORS: Age, Fitness, Time since onset of injury/illness/exacerbation, and 1-2 comorbidities: dialysis and sickle cell disease  are also affecting patient's functional outcome.   REHAB POTENTIAL: Good  CLINICAL DECISION MAKING: Evolving/moderate complexity  EVALUATION COMPLEXITY: Moderate   GOALS: Goals reviewed with patient? Yes  SHORT TERM GOALS: Target date: 07/05/2023 Pt will be independent and compliant with initial LE pain management, stretching, strength and balance focused HEP in order to maintain functional progress and improve mobility. Baseline:  To be established. Goal status: INITIAL  2.  Pt will decrease 5xSTS to </=16 seconds in order to demonstrate decreased risk for falls and improved functional bilateral LE strength and power. Baseline: 20.09 sec BUE support Goal status: INITIAL  3.  Pt will be independent in management of patellar edema using PRICE principles and education on mobilization techniques. Baseline: To be reviewed. Goal status: INITIAL  LONG TERM GOALS: Target date: 08/02/2023  Pt will be independent and compliant  with advanced and finalized LE pain management, stretching, strength and balance focused HEP in order to maintain functional progress and improve mobility. Baseline: To be established. Goal status: INITIAL  2.  Pt will improve KOOS score to >/=47% to indicate improved pain management, engagement in normal activity, and improved quality of life. Baseline: 27% Goal status: INITIAL  3.  Pt will decrease 5xSTS to </=12 seconds in order to demonstrate decreased risk for falls and improved functional bilateral LE strength and power. Baseline: 20.09 sec BUE support Goal status: INITIAL  4.  Pt will ambulate>/=350 feet on to demonstrate improved endurance for functional tasks in home and community. Baseline: 247 ft SBA Goal status: INITIAL  PLAN:  PT FREQUENCY: 1x/week  PT DURATION: 8 weeks  PLANNED INTERVENTIONS: 97164- PT Re-evaluation, 97110-Therapeutic exercises, 97530- Therapeutic activity, 97112- Neuromuscular re-education, 97535- Self Care, 96045- Manual therapy, [phone]- Gait training, Patient/Family education, Balance training, Stair training, Taping, Joint mobilization, Compression bandaging, Cryotherapy, and Moist heat  PLAN FOR NEXT SESSION: Add to HEP for stretching and strengthening of the BLE - glut med strength.  Try SciFit for strength and edema management?  ASSESS STGs!   Sadie Haber, PT, DPT 07/08/2023, 4:10 PM

## 2023-07-08 NOTE — Telephone Encounter (Signed)
  Chief Complaint: Medication Refill Symptoms: None at this time.. Frequency: N/A Pertinent Negatives: Patient denies symptoms at this time.  Disposition: [] ED /[] Urgent Care (no appt availability in office) / [] Appointment(In office/virtual)/ []  [place] [facility]/ [] Home Care/ [] Refused Recommended Disposition /[] [facility]/ []  Follow-up with PCP Additional Notes: AM is a 68 year old male being triaged for a Medication request, Oxycodone, 6 and 12 Hour. The patient says he needs the pain medication but is not symptomatic at this time. History of sickle cell. CAL at lunch during call, routing to office for office policy of controlled drug substance and refilling protocol.     Answer Assessment - Initial Assessment Questions 1. DRUG NAME: "What medicine do you need to have refilled?"     Oxycodone 6 Hour and 12 Hour  2. REFILLS REMAINING: "How many refills are remaining?" (Note: The label on the medicine or pill bottle will show how many refills are remaining. If there are no refills remaining, then a renewal may be needed.)     Unsure  3. EXPIRATION DATE: "What is the expiration date?" (Note: The label states when the prescription will expire, and thus can no longer be refilled.)     Unsure  4. PRESCRIBING HCP: "Who prescribed it?" Reason: If prescribed by specialist, call should be referred to that group.     Angus Seller, NP and Trudie Buckler, DO (ER)  5. SYMPTOMS: "Do you have any symptoms?"     None during triage  Protocols used: Medication Refill and Renewal Call-A-AH

## 2023-07-08 NOTE — Patient Instructions (Signed)
Access Code: [email or web address] URL: [email or web address] Date: 07/08/2023 Prepared by: Camille Bal  Exercises - 4 Way Patellar Glide  - 1 x daily - 7 x weekly - 3 sets - 10 reps - Clamshell  - 1 x daily - 4 x weekly - 3 sets - 10 reps - Sit to Stand with Resistance Around Legs  - 1 x daily - 4 x weekly - 2-3 sets - 10 reps - Long Sitting Calf Stretch with Strap  - 1 x daily - 4 x weekly - 1 sets - 3 reps - 45 seconds hold - Seated Hamstring Stretch  - 1 x daily - 4 x weekly - 1 sets - 3 reps - 45 seconds hold - Supine Bridge  - 1 x daily - 4 x weekly - 2 sets - 10 reps - Side Stepping with Counter Support  - 1 x daily - 4 x weekly - 3 sets - 10 reps - Side Stepping with Resistance at Thighs and Counter Support  - 1 x daily - 4 x weekly - 3 sets - 10 reps

## 2023-07-08 NOTE — Telephone Encounter (Signed)
Copied from CRM [id number]. Topic: Clinical - Medication Refill >> Jul 08, 2023 11:05 AM Fuller Mandril wrote: Most Recent Primary Care Visit:  Provider: Ivonne Andrew  Department: [facility]  Visit Type: HOSPITAL FOLLOW UP  Date: 06/21/2023   Medication:  oxyCODONE (OXYCONTIN) 10 mg 12 hr tablet oxyCODONE (ROXICODONE) 5 MG immediate release tablet    Has the patient contacted their pharmacy? No (Agent: If no, request that the patient contact the pharmacy for the refill. If patient does not wish to contact the pharmacy document the reason why and proceed with request.) (Agent: If yes, when and what did the pharmacy advise?)   Is this the correct pharmacy for this prescription? Yes If no, delete pharmacy and type the correct one.  This is the patient's preferred pharmacy:  [id number] [place], [place] - [address] [place] [address] Phone: [phone] Fax: [phone]     Has the prescription been filled recently? No   Is the patient out of the medication? Yes   Has the patient been seen for an appointment in the last year OR does the patient have an upcoming appointment? Yes 06/21/2023   Can we respond through MyChart? No   Agent: Please be advised that Rx refills may take up to 3 business days. We ask that you follow-up with your pharmacy.   Chief Complaint: Medication Refill Request  Additional Notes: Call placed to patient, no answer, left voicemail. Callback queued.

## 2023-07-08 NOTE — Telephone Encounter (Unsigned)
Copied from CRM [id number]. Topic: Clinical - Medication Refill >> Jul 08, 2023 11:05 AM Fuller Mandril wrote: Most Recent Primary Care Visit:  Provider: Ivonne Andrew  Department: [facility]  Visit Type: HOSPITAL FOLLOW UP  Date: 06/21/2023  Medication:  oxyCODONE (OXYCONTIN) 10 mg 12 hr tablet oxyCODONE (ROXICODONE) 5 MG immediate release tablet   Has the patient contacted their pharmacy? No (Agent: If no, request that the patient contact the pharmacy for the refill. If patient does not wish to contact the pharmacy document the reason why and proceed with request.) (Agent: If yes, when and what did the pharmacy advise?)  Is this the correct pharmacy for this prescription? Yes If no, delete pharmacy and type the correct one.  This is the patient's preferred pharmacy:  [id number] [place], [place] - [address] [place] [address] Phone: [phone] Fax: [phone]   Has the prescription been filled recently? No  Is the patient out of the medication? Yes  Has the patient been seen for an appointment in the last year OR does the patient have an upcoming appointment? Yes 06/21/2023  Can we respond through MyChart? No  Agent: Please be advised that Rx refills may take up to 3 business days. We ask that you follow-up with your pharmacy.

## 2023-07-09 ENCOUNTER — Other Ambulatory Visit: Payer: Self-pay | Admitting: Internal Medicine

## 2023-07-09 DIAGNOSIS — D571 Sickle-cell disease without crisis: Secondary | ICD-10-CM

## 2023-07-09 DIAGNOSIS — D57219 Sickle-cell/Hb-C disease with crisis, unspecified: Secondary | ICD-10-CM | POA: Diagnosis not present

## 2023-07-09 DIAGNOSIS — D5700 Hb-SS disease with crisis, unspecified: Secondary | ICD-10-CM | POA: Diagnosis not present

## 2023-07-09 DIAGNOSIS — N2581 Secondary hyperparathyroidism of renal origin: Secondary | ICD-10-CM | POA: Diagnosis not present

## 2023-07-09 DIAGNOSIS — G894 Chronic pain syndrome: Secondary | ICD-10-CM

## 2023-07-09 DIAGNOSIS — Z992 Dependence on renal dialysis: Secondary | ICD-10-CM | POA: Diagnosis not present

## 2023-07-09 DIAGNOSIS — D57819 Other sickle-cell disorders with crisis, unspecified: Secondary | ICD-10-CM | POA: Diagnosis not present

## 2023-07-09 DIAGNOSIS — L299 Pruritus, unspecified: Secondary | ICD-10-CM | POA: Diagnosis not present

## 2023-07-09 DIAGNOSIS — N186 End stage renal disease: Secondary | ICD-10-CM | POA: Diagnosis not present

## 2023-07-09 DIAGNOSIS — D631 Anemia in chronic kidney disease: Secondary | ICD-10-CM | POA: Diagnosis not present

## 2023-07-09 NOTE — Telephone Encounter (Signed)
Please see note below.   Copied from CRM [id number]. Topic: Clinical - Prescription Issue >> Jul 09, 2023  8:19 AM Carlatta H wrote: Reason for CRM: Patients insurance company called to see why medications are not being refilled as requested//Please advise patient//

## 2023-07-09 NOTE — Telephone Encounter (Signed)
Duplicate request. Medication request already routed to office earlier this morning.

## 2023-07-15 ENCOUNTER — Ambulatory Visit: Payer: PPO | Admitting: Physical Therapy

## 2023-07-16 ENCOUNTER — Telehealth: Payer: Self-pay | Admitting: Internal Medicine

## 2023-07-16 NOTE — Telephone Encounter (Signed)
Copied from CRM [id number]. Topic: Clinical - Prescription Issue >> Jul 16, 2023 12:48 PM Geroge Baseman wrote: Reason for CRM: [employer] calling to see why this patients prescriptions for oxycodone 10 mg and 5 mg have not been filled. Please call ASAP to advise. [phone] please leave a detailed message for leshauna if she does not answer, this is her direct line. Patient is having weakness and shortness of breath, they are wanting to know if he can get his next transfusion sooner or what they can do in the mean time to help

## 2023-07-17 ENCOUNTER — Ambulatory Visit: Payer: Self-pay | Admitting: Internal Medicine

## 2023-07-17 NOTE — Telephone Encounter (Signed)
Chief Complaint: SOB Symptoms: SOB even at rest, weakness Frequency: SOB since Friday Pertinent Negatives: Patient denies fever, CP, N/V, passing out Disposition: [x] ED /[] Urgent Care (no appt availability in office) / [] Appointment(In office/virtual)/ []  [facility]/ [] Home Care/ [x] Refused Recommended Disposition /[] [facility]/ []  Follow-up with PCP Additional Notes: Nurse Shauna from [employer] called on behalf of the pt. Blake Divine stated pt has been SOB since Friday. Pt states he is SOB even at rest and while lying down. Nurse Blake Divine is not currently with the patient and only does phone visits. Blake Divine states pt skipped dialysis yesterday because he was weak and did not feel able to get ready. Blake Divine states pt denies CP, fever, passing out. Blake Divine states pt's Hgb was 6.1 on Thursday at dialysis. Pt is scheduled for a blood transfusion on 2/4, but [place] states she feels he needs it sooner. This RN asked if pt has been checking his O2 at home, she says she does not think so. Per protocol, this RN advised to Blake Divine that the pt needs to go to the ED. Blake Divine stated that the pt will probably decline that. This RN offered to call the pt directly. ZOXWRU asked for a conference call. RN called pt and pt did not answer. RN left message and asked the pt to go to the ED and to call 911 for transport. RN left a call back number. Blake Divine states she will try to call the pt later.   Additionally, Blake Divine states that the pt has been trying to get his long and short-acting oxycodone refilled for 2 weeks and has not been able to. This RN said she will look into the issue and relay this to the appropriate people for follow-up. Shauna verbalized understanding.   Copied from CRM [id number]. Topic: Clinical - Red Word Triage >> Jul 17, 2023  9:24 AM Fonda Kinder J wrote: Red Word that prompted transfer to Nurse Triage: Shortness of breath Reason for Disposition  [1] MODERATE difficulty breathing  (e.g., speaks in phrases, SOB even at rest, pulse 100-120) AND [2] NEW-onset or WORSE than normal  Answer Assessment - Initial Assessment Questions 1. RESPIRATORY STATUS: "Describe your breathing?" (e.g., wheezing, shortness of breath, unable to speak, severe coughing)      Nurse was on the phone with the patient. Pt reported SOB. Nurse said she "heard nothing noticeable on the phone call" - skipped dialysis yesterday because he didn't feel up to getting ready 2. ONSET: "When did this breathing problem begin?"      Friday 3. PATTERN "Does the difficult breathing come and go, or has it been constant since it started?"      Persistent  4. SEVERITY: "How bad is your breathing?" (e.g., mild, moderate, severe)    - MILD: No SOB at rest, mild SOB with walking, speaks normally in sentences, can lie down, no retractions, pulse < 100.    - MODERATE: SOB at rest, SOB with minimal exertion and prefers to sit, cannot lie down flat, speaks in phrases, mild retractions, audible wheezing, pulse 100-120.    - SEVERE: Very SOB at rest, speaks in single words, struggling to breathe, sitting hunched forward, retractions, pulse > 120      SOB at rest  5. RECURRENT SYMPTOM: "Have you had difficulty breathing before?" If Yes, ask: "When was the last time?" and "What happened that time?"      Yes, sickle cell 6. CARDIAC HISTORY: "Do you have any history of heart disease?" (e.g., heart  attack, angina, bypass surgery, angioplasty)      HF 7. LUNG HISTORY: "Do you have any history of lung disease?"  (e.g., pulmonary embolus, asthma, emphysema)     COPD 8. CAUSE: "What do you think is causing the breathing problem?"      Hgb of 6.1 9. OTHER SYMPTOMS: "Do you have any other symptoms? (e.g., dizziness, runny nose, cough, chest pain, fever)     No 10. O2 SATURATION MONITOR:  "Do you use an oxygen saturation monitor (pulse oximeter) at home?" If Yes, ask: "What is your reading (oxygen level) today?" "What is your usual  oxygen saturation reading?" (e.g., 95%)       "Not that I am aware of"  Protocols used: Breathing Difficulty-A-AH

## 2023-07-18 ENCOUNTER — Encounter (HOSPITAL_COMMUNITY): Payer: Self-pay

## 2023-07-18 ENCOUNTER — Inpatient Hospital Stay (HOSPITAL_COMMUNITY)
Admission: EM | Admit: 2023-07-18 | Discharge: 2023-07-23 | DRG: 291 | Disposition: A | Discharge status: Hospice | Payer: PPO | Attending: Internal Medicine | Admitting: Internal Medicine

## 2023-07-18 ENCOUNTER — Emergency Department (HOSPITAL_COMMUNITY): Payer: PPO

## 2023-07-18 ENCOUNTER — Other Ambulatory Visit: Payer: Self-pay

## 2023-07-18 ENCOUNTER — Encounter (HOSPITAL_COMMUNITY): Payer: Self-pay | Admitting: *Deleted

## 2023-07-18 ENCOUNTER — Inpatient Hospital Stay (HOSPITAL_COMMUNITY): Payer: PPO

## 2023-07-18 DIAGNOSIS — I517 Cardiomegaly: Secondary | ICD-10-CM | POA: Diagnosis not present

## 2023-07-18 DIAGNOSIS — Z515 Encounter for palliative care: Secondary | ICD-10-CM | POA: Diagnosis not present

## 2023-07-18 DIAGNOSIS — D5700 Hb-SS disease with crisis, unspecified: Secondary | ICD-10-CM

## 2023-07-18 DIAGNOSIS — Z91158 Patient's noncompliance with renal dialysis for other reason: Secondary | ICD-10-CM | POA: Diagnosis not present

## 2023-07-18 DIAGNOSIS — Z87891 Personal history of nicotine dependence: Secondary | ICD-10-CM

## 2023-07-18 DIAGNOSIS — I132 Hypertensive heart and chronic kidney disease with heart failure and with stage 5 chronic kidney disease, or end stage renal disease: Principal | ICD-10-CM | POA: Diagnosis present

## 2023-07-18 DIAGNOSIS — R000 Tachycardia, unspecified: Secondary | ICD-10-CM | POA: Diagnosis present

## 2023-07-18 DIAGNOSIS — Z992 Dependence on renal dialysis: Secondary | ICD-10-CM | POA: Diagnosis not present

## 2023-07-18 DIAGNOSIS — R1010 Upper abdominal pain, unspecified: Secondary | ICD-10-CM | POA: Diagnosis not present

## 2023-07-18 DIAGNOSIS — D631 Anemia in chronic kidney disease: Secondary | ICD-10-CM | POA: Diagnosis not present

## 2023-07-18 DIAGNOSIS — H548 Legal blindness, as defined in USA: Secondary | ICD-10-CM | POA: Diagnosis not present

## 2023-07-18 DIAGNOSIS — R188 Other ascites: Secondary | ICD-10-CM | POA: Diagnosis not present

## 2023-07-18 DIAGNOSIS — I081 Rheumatic disorders of both mitral and tricuspid valves: Secondary | ICD-10-CM | POA: Diagnosis present

## 2023-07-18 DIAGNOSIS — R0600 Dyspnea, unspecified: Secondary | ICD-10-CM | POA: Diagnosis not present

## 2023-07-18 DIAGNOSIS — Z66 Do not resuscitate: Secondary | ICD-10-CM | POA: Diagnosis not present

## 2023-07-18 DIAGNOSIS — R531 Weakness: Secondary | ICD-10-CM | POA: Diagnosis not present

## 2023-07-18 DIAGNOSIS — N186 End stage renal disease: Secondary | ICD-10-CM | POA: Diagnosis not present

## 2023-07-18 DIAGNOSIS — R627 Adult failure to thrive: Secondary | ICD-10-CM | POA: Diagnosis present

## 2023-07-18 DIAGNOSIS — D649 Anemia, unspecified: Secondary | ICD-10-CM

## 2023-07-18 DIAGNOSIS — N2581 Secondary hyperparathyroidism of renal origin: Secondary | ICD-10-CM | POA: Diagnosis present

## 2023-07-18 DIAGNOSIS — K7460 Unspecified cirrhosis of liver: Secondary | ICD-10-CM | POA: Diagnosis not present

## 2023-07-18 DIAGNOSIS — I158 Other secondary hypertension: Secondary | ICD-10-CM | POA: Diagnosis not present

## 2023-07-18 DIAGNOSIS — Z9049 Acquired absence of other specified parts of digestive tract: Secondary | ICD-10-CM | POA: Diagnosis not present

## 2023-07-18 DIAGNOSIS — I4821 Permanent atrial fibrillation: Secondary | ICD-10-CM | POA: Diagnosis not present

## 2023-07-18 DIAGNOSIS — I5023 Acute on chronic systolic (congestive) heart failure: Secondary | ICD-10-CM

## 2023-07-18 DIAGNOSIS — I272 Pulmonary hypertension, unspecified: Secondary | ICD-10-CM | POA: Diagnosis not present

## 2023-07-18 DIAGNOSIS — Z5329 Procedure and treatment not carried out because of patient's decision for other reasons: Secondary | ICD-10-CM | POA: Diagnosis present

## 2023-07-18 DIAGNOSIS — E8779 Other fluid overload: Principal | ICD-10-CM

## 2023-07-18 DIAGNOSIS — I9589 Other hypotension: Secondary | ICD-10-CM | POA: Diagnosis not present

## 2023-07-18 DIAGNOSIS — I4510 Unspecified right bundle-branch block: Secondary | ICD-10-CM | POA: Diagnosis present

## 2023-07-18 DIAGNOSIS — E8770 Fluid overload, unspecified: Secondary | ICD-10-CM | POA: Diagnosis not present

## 2023-07-18 DIAGNOSIS — Z6820 Body mass index (BMI) 20.0-20.9, adult: Secondary | ICD-10-CM

## 2023-07-18 DIAGNOSIS — E875 Hyperkalemia: Secondary | ICD-10-CM | POA: Diagnosis not present

## 2023-07-18 DIAGNOSIS — R0989 Other specified symptoms and signs involving the circulatory and respiratory systems: Secondary | ICD-10-CM | POA: Diagnosis not present

## 2023-07-18 DIAGNOSIS — I493 Ventricular premature depolarization: Secondary | ICD-10-CM | POA: Diagnosis present

## 2023-07-18 DIAGNOSIS — M791 Myalgia, unspecified site: Secondary | ICD-10-CM

## 2023-07-18 DIAGNOSIS — N19 Unspecified kidney failure: Secondary | ICD-10-CM | POA: Diagnosis present

## 2023-07-18 DIAGNOSIS — J449 Chronic obstructive pulmonary disease, unspecified: Secondary | ICD-10-CM

## 2023-07-18 DIAGNOSIS — D571 Sickle-cell disease without crisis: Secondary | ICD-10-CM | POA: Diagnosis present

## 2023-07-18 DIAGNOSIS — Z8249 Family history of ischemic heart disease and other diseases of the circulatory system: Secondary | ICD-10-CM | POA: Diagnosis not present

## 2023-07-18 DIAGNOSIS — Z79899 Other long term (current) drug therapy: Secondary | ICD-10-CM

## 2023-07-18 DIAGNOSIS — G894 Chronic pain syndrome: Secondary | ICD-10-CM | POA: Diagnosis not present

## 2023-07-18 DIAGNOSIS — Z811 Family history of alcohol abuse and dependence: Secondary | ICD-10-CM

## 2023-07-18 DIAGNOSIS — Z1152 Encounter for screening for COVID-19: Secondary | ICD-10-CM

## 2023-07-18 DIAGNOSIS — R29810 Facial weakness: Secondary | ICD-10-CM | POA: Diagnosis present

## 2023-07-18 DIAGNOSIS — R109 Unspecified abdominal pain: Secondary | ICD-10-CM | POA: Diagnosis not present

## 2023-07-18 DIAGNOSIS — Z604 Social exclusion and rejection: Secondary | ICD-10-CM | POA: Diagnosis present

## 2023-07-18 LAB — I-STAT CHEM 8, ED
BUN: 117 mg/dL — ABNORMAL HIGH (ref 8–23)
Calcium, Ion: 0.92 mmol/L — ABNORMAL LOW (ref 1.15–1.40)
Chloride: 102 mmol/L (ref 98–111)
Creatinine, Ser: 15.6 mg/dL — ABNORMAL HIGH (ref 0.61–1.24)
Glucose, Bld: 76 mg/dL (ref 70–99)
HCT: 19 % — ABNORMAL LOW (ref 39.0–52.0)
Hemoglobin: 6.5 g/dL — CL (ref 13.0–17.0)
Potassium: 7.3 mmol/L (ref 3.5–5.1)
Sodium: 132 mmol/L — ABNORMAL LOW (ref 135–145)
TCO2: 18 mmol/L — ABNORMAL LOW (ref 22–32)

## 2023-07-18 LAB — COMPREHENSIVE METABOLIC PANEL
ALT: 38 U/L (ref 0–44)
AST: 50 U/L — ABNORMAL HIGH (ref 15–41)
Albumin: 3.7 g/dL (ref 3.5–5.0)
Alkaline Phosphatase: 159 U/L — ABNORMAL HIGH (ref 38–126)
Anion gap: 30 — ABNORMAL HIGH (ref 5–15)
BUN: 120 mg/dL — ABNORMAL HIGH (ref 8–23)
CO2: 17 mmol/L — ABNORMAL LOW (ref 22–32)
Calcium: 9.4 mg/dL (ref 8.9–10.3)
Chloride: 91 mmol/L — ABNORMAL LOW (ref 98–111)
Creatinine, Ser: 15.23 mg/dL — ABNORMAL HIGH (ref 0.61–1.24)
GFR, Estimated: 3 mL/min — ABNORMAL LOW (ref >60)
Glucose, Bld: 82 mg/dL (ref 70–99)
Potassium: >7.5 mmol/L (ref 3.5–5.1)
Sodium: 138 mmol/L (ref 135–145)
Total Bilirubin: 6.1 mg/dL — ABNORMAL HIGH (ref 0.0–1.2)
Total Protein: 7.4 g/dL (ref 6.5–8.1)

## 2023-07-18 LAB — CBC WITH DIFFERENTIAL/PLATELET
Abs Immature Granulocytes: 0.28 10*3/uL — ABNORMAL HIGH (ref 0.00–0.07)
Basophils Absolute: 0 10*3/uL (ref 0.0–0.1)
Basophils Relative: 0 %
Eosinophils Absolute: 0 10*3/uL (ref 0.0–0.5)
Eosinophils Relative: 0 %
HCT: 15.8 % — ABNORMAL LOW (ref 39.0–52.0)
Hemoglobin: 5.6 g/dL — CL (ref 13.0–17.0)
Immature Granulocytes: 3 %
Lymphocytes Relative: 3 %
Lymphs Abs: 0.4 10*3/uL — ABNORMAL LOW (ref 0.7–4.0)
MCH: 41.2 pg — ABNORMAL HIGH (ref 26.0–34.0)
MCHC: 35.4 g/dL (ref 30.0–36.0)
MCV: 116.2 fL — ABNORMAL HIGH (ref 80.0–100.0)
Monocytes Absolute: 2.4 10*3/uL — ABNORMAL HIGH (ref 0.1–1.0)
Monocytes Relative: 22 %
Neutro Abs: 7.9 10*3/uL — ABNORMAL HIGH (ref 1.7–7.7)
Neutrophils Relative %: 72 %
Platelets: 107 10*3/uL — ABNORMAL LOW (ref 150–400)
RBC: 1.36 MIL/uL — ABNORMAL LOW (ref 4.22–5.81)
RDW: 22.5 % — ABNORMAL HIGH (ref 11.5–15.5)
WBC: 11 10*3/uL — ABNORMAL HIGH (ref 4.0–10.5)
nRBC: 72.8 % — ABNORMAL HIGH (ref 0.0–0.2)

## 2023-07-18 LAB — PROTIME-INR
INR: 1.8 — ABNORMAL HIGH (ref 0.8–1.2)
Prothrombin Time: 20.8 s — ABNORMAL HIGH (ref 11.4–15.2)

## 2023-07-18 LAB — CBC
HCT: 17.3 % — ABNORMAL LOW (ref 39.0–52.0)
Hemoglobin: 6.4 g/dL — CL (ref 13.0–17.0)
MCH: 39.3 pg — ABNORMAL HIGH (ref 26.0–34.0)
MCHC: 37 g/dL — ABNORMAL HIGH (ref 30.0–36.0)
MCV: 106.1 fL — ABNORMAL HIGH (ref 80.0–100.0)
Platelets: 107 10*3/uL — ABNORMAL LOW (ref 150–400)
RBC: 1.63 MIL/uL — ABNORMAL LOW (ref 4.22–5.81)
RDW: 22.1 % — ABNORMAL HIGH (ref 11.5–15.5)
WBC: 9.8 10*3/uL (ref 4.0–10.5)
nRBC: 76.9 % — ABNORMAL HIGH (ref 0.0–0.2)

## 2023-07-18 LAB — RETICULOCYTES
Immature Retic Fract: 53.2 % — ABNORMAL HIGH (ref 2.3–15.9)
RBC.: 1.32 MIL/uL — ABNORMAL LOW (ref 4.22–5.81)
Retic Count, Absolute: 131.2 10*3/uL (ref 19.0–186.0)
Retic Ct Pct: 9.9 % — ABNORMAL HIGH (ref 0.4–3.1)

## 2023-07-18 LAB — HEPATIC FUNCTION PANEL
ALT: 39 U/L (ref 0–44)
AST: 54 U/L — ABNORMAL HIGH (ref 15–41)
Albumin: 3.7 g/dL (ref 3.5–5.0)
Alkaline Phosphatase: 159 U/L — ABNORMAL HIGH (ref 38–126)
Bilirubin, Direct: 3.3 mg/dL — ABNORMAL HIGH (ref 0.0–0.2)
Indirect Bilirubin: 2.8 mg/dL — ABNORMAL HIGH (ref 0.3–0.9)
Total Bilirubin: 6.1 mg/dL — ABNORMAL HIGH (ref 0.0–1.2)
Total Protein: 7.7 g/dL (ref 6.5–8.1)

## 2023-07-18 LAB — RESP PANEL BY RT-PCR (RSV, FLU A&B, COVID)  RVPGX2
Influenza A by PCR: NEGATIVE
Influenza B by PCR: NEGATIVE
Resp Syncytial Virus by PCR: NEGATIVE
SARS Coronavirus 2 by RT PCR: NEGATIVE

## 2023-07-18 LAB — BRAIN NATRIURETIC PEPTIDE: B Natriuretic Peptide: >4500 pg/mL — ABNORMAL HIGH (ref 0.0–100.0)

## 2023-07-18 LAB — PREPARE RBC (CROSSMATCH)

## 2023-07-18 LAB — HEPATITIS B SURFACE ANTIGEN: Hepatitis B Surface Ag: NONREACTIVE

## 2023-07-18 LAB — LIPASE, BLOOD: Lipase: 32 U/L (ref 11–51)

## 2023-07-18 MED ORDER — INSULIN ASPART 100 UNIT/ML IV SOLN
5.0000 [IU] | Freq: Once | INTRAVENOUS | Status: AC
  Administered 2023-07-18: 5 [IU] via INTRAVENOUS

## 2023-07-18 MED ORDER — OXYCODONE HCL ER 10 MG PO T12A
10.0000 mg | EXTENDED_RELEASE_TABLET | Freq: Two times a day (BID) | ORAL | Status: DC
  Administered 2023-07-18: 10 mg via ORAL
  Filled 2023-07-18: qty 1

## 2023-07-18 MED ORDER — HYDROXYUREA 500 MG PO CAPS
500.0000 mg | ORAL_CAPSULE | Freq: Every day | ORAL | Status: DC
  Administered 2023-07-19 – 2023-07-23 (×4): 500 mg via ORAL
  Filled 2023-07-18 (×5): qty 1

## 2023-07-18 MED ORDER — ALBUMIN HUMAN 25 % IV SOLN
25.0000 g | Freq: Once | INTRAVENOUS | Status: AC
  Administered 2023-07-18: 25 g via INTRAVENOUS

## 2023-07-18 MED ORDER — SODIUM CHLORIDE 0.9% FLUSH
3.0000 mL | Freq: Two times a day (BID) | INTRAVENOUS | Status: DC
  Administered 2023-07-18 – 2023-07-23 (×9): 3 mL via INTRAVENOUS

## 2023-07-18 MED ORDER — SODIUM BICARBONATE 8.4 % IV SOLN
50.0000 meq | Freq: Once | INTRAVENOUS | Status: AC
  Administered 2023-07-18: 50 meq via INTRAVENOUS
  Filled 2023-07-18: qty 50

## 2023-07-18 MED ORDER — DEXTROSE 10 % IV SOLN: Freq: Once | INTRAVENOUS | Status: AC

## 2023-07-18 MED ORDER — DEXTROSE 50 % IV SOLN
1.0000 | Freq: Once | INTRAVENOUS | Status: AC
  Administered 2023-07-18: 50 mL via INTRAVENOUS
  Filled 2023-07-18: qty 50

## 2023-07-18 MED ORDER — MIDODRINE HCL 5 MG PO TABS
10.0000 mg | ORAL_TABLET | Freq: Once | ORAL | Status: AC
  Administered 2023-07-18: 10 mg via ORAL

## 2023-07-18 MED ORDER — OXYCODONE HCL 5 MG PO TABS: 10.0000 mg | ORAL_TABLET | Freq: Four times a day (QID) | ORAL | Status: DC | PRN

## 2023-07-18 MED ORDER — HEPARIN SODIUM (PORCINE) 5000 UNIT/ML IJ SOLN
5000.0000 [IU] | Freq: Three times a day (TID) | INTRAMUSCULAR | Status: DC
  Administered 2023-07-18 – 2023-07-22 (×11): 5000 [IU] via SUBCUTANEOUS
  Filled 2023-07-18 (×11): qty 1

## 2023-07-18 MED ORDER — SODIUM CHLORIDE 0.9% IV SOLUTION: Freq: Once | INTRAVENOUS | Status: DC

## 2023-07-18 MED ORDER — POLYETHYLENE GLYCOL 3350 17 G PO PACK: 17.0000 g | PACK | Freq: Every day | ORAL | Status: DC | PRN

## 2023-07-18 MED ORDER — FENTANYL CITRATE PF 50 MCG/ML IJ SOSY
50.0000 ug | PREFILLED_SYRINGE | Freq: Once | INTRAMUSCULAR | Status: AC
  Administered 2023-07-18: 50 ug via INTRAVENOUS
  Filled 2023-07-18: qty 1

## 2023-07-18 MED ORDER — LIDOCAINE HCL (PF) 1 % IJ SOLN: 5.0000 mL | INTRAMUSCULAR | Status: DC | PRN

## 2023-07-18 MED ORDER — PENTAFLUOROPROP-TETRAFLUOROETH EX AERO: 1.0000 | INHALATION_SPRAY | CUTANEOUS | Status: DC | PRN

## 2023-07-18 MED ORDER — GABAPENTIN 100 MG PO CAPS
100.0000 mg | ORAL_CAPSULE | Freq: Three times a day (TID) | ORAL | Status: DC
Start: 2023-07-18 — End: 2023-07-24
  Administered 2023-07-18 – 2023-07-23 (×14): 100 mg via ORAL
  Filled 2023-07-18 (×14): qty 1

## 2023-07-18 MED ORDER — FOLIC ACID 1 MG PO TABS
1.0000 mg | ORAL_TABLET | Freq: Every day | ORAL | Status: DC
  Administered 2023-07-19: 1 mg via ORAL
  Filled 2023-07-18: qty 1

## 2023-07-18 MED ORDER — CHLORHEXIDINE GLUCONATE CLOTH 2 % EX PADS
6.0000 | MEDICATED_PAD | Freq: Every day | CUTANEOUS | Status: DC
  Administered 2023-07-19 – 2023-07-23 (×5): 6 via TOPICAL

## 2023-07-18 MED ORDER — PANTOPRAZOLE SODIUM 40 MG PO TBEC
40.0000 mg | DELAYED_RELEASE_TABLET | Freq: Every day | ORAL | Status: DC
  Administered 2023-07-19: 40 mg via ORAL
  Filled 2023-07-18: qty 1

## 2023-07-18 MED ORDER — MIDODRINE HCL 5 MG PO TABS
10.0000 mg | ORAL_TABLET | Freq: Three times a day (TID) | ORAL | Status: DC
  Administered 2023-07-19 (×2): 10 mg via ORAL
  Filled 2023-07-18 (×3): qty 2

## 2023-07-18 MED ORDER — LIDOCAINE-PRILOCAINE 2.5-2.5 % EX CREA: 1.0000 | TOPICAL_CREAM | CUTANEOUS | Status: DC | PRN

## 2023-07-18 MED ORDER — HYDROCODONE-ACETAMINOPHEN 10-325 MG PO TABS: 1.0000 | ORAL_TABLET | Freq: Four times a day (QID) | ORAL | Status: DC | PRN

## 2023-07-18 MED ORDER — ACETAMINOPHEN 325 MG PO TABS: 650.0000 mg | ORAL_TABLET | Freq: Four times a day (QID) | ORAL | Status: DC | PRN

## 2023-07-18 MED ORDER — ACETAMINOPHEN 650 MG RE SUPP: 650.0000 mg | Freq: Four times a day (QID) | RECTAL | Status: DC | PRN

## 2023-07-18 MED ORDER — SODIUM ZIRCONIUM CYCLOSILICATE 10 G PO PACK
10.0000 g | PACK | Freq: Once | ORAL | Status: AC
  Administered 2023-07-18: 10 g via ORAL
  Filled 2023-07-18: qty 1

## 2023-07-18 MED ORDER — CALCIUM GLUCONATE 10 % IV SOLN
1.0000 g | Freq: Once | INTRAVENOUS | Status: AC
  Administered 2023-07-18: 1 g via INTRAVENOUS
  Filled 2023-07-18: qty 10

## 2023-07-18 NOTE — Procedures (Signed)
I was present at this dialysis session. I have reviewed the session and made appropriate changes.   Just starting HD.  4L UF goal with midodrine and albumin support. Rec pRBC as well.  Hyperkalemic, 2K bath, trend values post HD.    Filed Weights   07/18/23 1057  Weight: 67.1 kg    Recent Labs  Lab 07/18/23 1142 07/18/23 1148  NA 138 132*  K >7.5* 7.3*  CL 91* 102  CO2 17*  --   GLUCOSE 82 76  BUN 120* 117*  CREATININE 15.23* 15.60*  CALCIUM 9.4  --     Recent Labs  Lab 07/18/23 1142 07/18/23 1148  WBC 11.0*  --   NEUTROABS 7.9*  --   HGB 5.6* 6.5*  HCT 15.8* 19.0*  MCV 116.2*  --   PLT 107*  --     Scheduled Meds:  sodium chloride   Intravenous Once   [START ON 07/19/2023] Chlorhexidine Gluconate Cloth  6 each Topical Q0600   [START ON 07/19/2023] folic acid  1 mg Oral Daily   gabapentin  100 mg Oral TID   heparin  5,000 Units Subcutaneous Q8H   [START ON 07/19/2023] hydroxyurea  500 mg Oral Daily   [START ON 07/19/2023] midodrine  10 mg Oral TID WC   oxyCODONE  10 mg Oral Q12H   [START ON 07/19/2023] pantoprazole  40 mg Oral Daily   sodium chloride flush  3 mL Intravenous Q12H   Continuous Infusions: PRN Meds:.acetaminophen **OR** acetaminophen, HYDROcodone-acetaminophen, lidocaine (PF), lidocaine-prilocaine, pentafluoroprop-tetrafluoroeth, polyethylene glycol   Patrick Heck  MD 07/18/2023, 3:47 PM

## 2023-07-18 NOTE — Progress Notes (Signed)
Received patient in bed to unit.  Alert and oriented.  Informed consent signed and in chart.   TX duration:3.5  Patient tolerated well.  Transported back to the room  Alert, without acute distress.  Hand-off given to patient's nurse.   Access used: right AVF Access issues: none  Total UF removed: 3.4L Medication(s) given: midodrine, albumin, 1 unit PRBC   07/18/23 1854  Vitals  Temp (!) 97.4 F (36.3 C)  Temp Source Oral  BP 99/69  MAP (mmHg) 79  BP Location Left Arm  BP Method Automatic  Patient Position (if appropriate) Lying  Pulse Rate 90  Pulse Rate Source Monitor  ECG Heart Rate 93  Resp 14  Oxygen Therapy  SpO2 100 %  O2 Device Room Air  During Treatment Monitoring  Blood Flow Rate (mL/min) 399 mL/min  Arterial Pressure (mmHg) -138.38 mmHg  Venous Pressure (mmHg) 238.57 mmHg  TMP (mmHg) 6.87 mmHg  Ultrafiltration Rate (mL/min) 0 mL/min  Dialysate Flow Rate (mL/min) 300 ml/min  Dialysate Potassium Concentration 2  Dialysate Calcium Concentration 2.5  Duration of HD Treatment -hour(s) 3.47 hour(s)  Cumulative Fluid Removed (mL) per Treatment  3436.69  HD Safety Checks Performed Yes  Intra-Hemodialysis Comments Tx completed  Dialysis Fluid Bolus Normal Saline  Bolus Amount (mL) 300 mL      Lutricia Widjaja [facility]

## 2023-07-18 NOTE — ED Triage Notes (Signed)
BIB [facility] from home for general weakness, general abd pain and sob x5d. H/o HD and Sickle cell disease. Has missed multiple HD sessions. Family convinced pt to go to ED. Denies fever, cough, swelling, CP, NVD. No change in POs, bowel or bladder.  Alert, NAD, calm, interactive.  Afib 100 on monitor, h/o same.

## 2023-07-18 NOTE — ED Notes (Signed)
Xray at [facility]

## 2023-07-18 NOTE — ED Provider Notes (Signed)
[place] EMERGENCY DEPARTMENT AT [facility] Provider Note   CSN: [id number] Arrival date & time: 07/18/23  1035     History  Chief Complaint  Patient presents with   Abdominal Pain    Patrick [name] is a 68 y.o. male with a past medical history of sickle cell disease, end-stage renal disease status post open cholecystectomy who presents to the emergency department for weakness shortness of breath and hallucinations.  Patient reports that he ran out of his pain medications that he takes regularly more than 1 week ago.  About a week ago he began having severe pain all over his body, poor appetite, diarrhea.  He is complaining of pain in his shoulders and legs.  He is unsure if this is like his previous sickle cell crises.  He is complaining that he feels extremely sleepy and weak.  He is not sure when his last dialysis session was but states that it was clearly more than 1 week ago because he has felt too bad to get up and go to dialysis.  His family saw him today and convinced him to come to the emergency department.  He has a history of Bell's palsy.   Abdominal Pain      Home Medications Prior to Admission medications   Medication Sig Start Date End Date Taking? Authorizing Provider  acetaminophen (TYLENOL) 500 MG tablet Take 1,500 mg by mouth 3 (three) times daily as needed for mild pain, moderate pain, fever or headache.    [provider]  allopurinol (ZYLOPRIM) 100 MG tablet Take 1 tablet (100 mg total) by mouth Every Tuesday,Thursday,and Saturday with dialysis. 06/04/23   Faith Rogue, DO  AURYXIA 1 GM 210 MG(Fe) tablet Take 210-420 mg by mouth See admin instructions. 420 mg with every meal, 210 mg with each snack. 02/11/21   [provider]  cinacalcet (SENSIPAR) 30 MG tablet Take 1 tablet (30 mg total) by mouth Every Tuesday,Thursday,and Saturday with dialysis. 06/12/22   Shahmehdi, Gemma Payor, MD  diphenhydramine-acetaminophen (TYLENOL PM)  25-500 MG TABS tablet Take 2 tablets by mouth at bedtime as needed (sleep/pain). 09/26/22   Ivonne Andrew, NP  folic acid (FOLVITE) 1 MG tablet TAKE 1 TABLET BY MOUTH EVERY DAY 04/08/23   Massie Maroon, FNP  gabapentin (NEURONTIN) 100 MG capsule Take 1 capsule (100 mg total) by mouth 3 (three) times daily. 06/21/23   Ivonne Andrew, NP  hydroxyurea (HYDREA) 500 MG capsule Take 1 capsule (500 mg total) by mouth daily. 05/12/23   Ivonne Andrew, NP  lidocaine-prilocaine (EMLA) cream Apply 1 application. topically Every Tuesday,Thursday,and Saturday with dialysis. 09/08/19   [provider]  midodrine (PROAMATINE) 10 MG tablet Take 1 tablet (10 mg total) by mouth 3 (three) times daily with meals. 06/04/23   Faith Rogue, DO  ofloxacin (OCUFLOX) 0.3 % ophthalmic solution Place 1 drop into both eyes 2 (two) times daily. 03/31/23   [provider]  oxyCODONE (OXYCONTIN) 10 mg 12 hr tablet Take 1 tablet (10 mg total) by mouth every 12 (twelve) hours. 05/12/23   Ivonne Andrew, NP  oxyCODONE (ROXICODONE) 5 MG immediate release tablet Take 2 tablets (10 mg total) by mouth every 6 (six) hours as needed for severe pain (pain score 7-10) or breakthrough pain. 06/13/23   Arletha Pili, DO  pantoprazole (PROTONIX) 40 MG tablet Take 1 tablet (40 mg total) by mouth daily. Use this prescription after done with twice daily dosing x 2 months  06/04/23   Faith Rogue, DO  prednisoLONE acetate (PRED FORTE) 1 % ophthalmic suspension Place 1 drop into both eyes 2 (two) times daily. 04/03/23   [provider]  timolol (TIMOPTIC) 0.5 % ophthalmic solution Place 1 drop into both eyes 2 (two) times daily. 05/14/23   [provider]  tiZANidine (ZANAFLEX) 4 MG tablet TAKE 1/2 TABLET (2MG  TOTAL) BY MOUTH EVERY 8 HOURS AS NEEDED FOR MUSCLE SPASM 05/12/23   Ivonne Andrew, NP  torsemide (DEMADEX) 20 MG tablet TAKE 2 TABLETS BY MOUTH IN THE MORNING AND AT BEDTIME Patient not taking:  Reported on 06/25/2023 01/09/23   Massie Maroon, FNP      Allergies    Patient has no known allergies.    Review of Systems   Review of Systems  Gastrointestinal:  Positive for abdominal pain.    Physical Exam Updated Vital Signs BP 97/67 (BP Location: Left Arm)   Pulse (!) 108   Temp 97.8 F (36.6 C) (Oral)   Resp 20   Wt 67.1 kg   SpO2 98% Comment: Simultaneous filing. User may not have seen previous data.  BMI 20.64 kg/m  Physical Exam Vitals and nursing note reviewed.  Constitutional:      General: He is not in acute distress.    Appearance: He is well-developed. He is not diaphoretic.  HENT:     Head: Normocephalic and atraumatic.  Eyes:     General: No scleral icterus.    Conjunctiva/sclera: Conjunctivae normal.  Neck:     Vascular: JVD present.  Cardiovascular:     Rate and Rhythm: Regular rhythm. Tachycardia present.     Heart sounds: Normal heart sounds.  Pulmonary:     Effort: Pulmonary effort is normal. Tachypnea present. No respiratory distress.     Breath sounds: Examination of the right-middle field reveals decreased breath sounds. Examination of the left-middle field reveals decreased breath sounds. Examination of the right-lower field reveals decreased breath sounds. Examination of the left-lower field reveals decreased breath sounds. Decreased breath sounds present.  Abdominal:     Palpations: Abdomen is soft.     Tenderness: There is abdominal tenderness.     Comments: Well-healed midline surgical scar, no distention, generalized tenderness and guarding  Musculoskeletal:     Cervical back: Normal range of motion and neck supple.  Skin:    General: Skin is warm and dry.  Neurological:     Mental Status: He is alert.  Psychiatric:        Behavior: Behavior normal.     ED Results / Procedures / Treatments   Labs (all labs ordered are listed, but only abnormal results are displayed) Labs Reviewed  LIPASE, BLOOD  COMPREHENSIVE METABOLIC PANEL   CBC WITH DIFFERENTIAL/PLATELET  I-STAT CHEM 8, ED    EKG None  Radiology No results found.  Procedures .Critical Care  Performed by: Arthor Captain, PA-C Authorized by: Arthor Captain, PA-C   Critical care provider statement:    Critical care time (minutes):  65   Critical care time was exclusive of:  Separately billable procedures and treating other patients   Critical care was necessary to treat or prevent imminent or life-threatening deterioration of the following conditions:  Metabolic crisis and renal failure   Critical care was time spent personally by me on the following activities:  Development of treatment plan with patient or surrogate, discussions with consultants, evaluation of patient's response to treatment, examination of patient, ordering and review of laboratory studies, ordering and  review of radiographic studies, ordering and performing treatments and interventions, pulse oximetry, re-evaluation of patient's condition, review of old charts and obtaining history from patient or surrogate     Medications Ordered in ED Medications - No data to display  ED Course/ Medical Decision Making/ A&P Clinical Course as of 07/18/23 1616  Thu Jul 18, 2023  1250 Potassium(!!): 7.3 [AH]  1250 Creatinine(!): 15.60 [AH]  1250 BUN(!): 117 [AH]  1250 Calcium Ionized(!): 0.92 [AH]  1250 TCO2(!): 18 Patient with critical lab findings of potassium 7.3.  BUN is 117 and creatinine 15.6.  Suspect uremic encephalopathy.  Patient also notably has a hemoglobin of 6.5 and was supposedly scheduled for an outpatient blood transfusion today.  I have ordered a type and screen.  Labs are otherwise pending.  I have ordered stat calcium gluconate, 1 dose of Lokelma and a consult to nephrology. [AH]  1251 DG Chest Portable 1 View I visualized a 1 view chest x-ray and an interpreted these findings.  Patient has what appears to be volume overload.  Clinically patient looks as though he has fluid  in his chest as well with JVD.  Questionable torturous vessel per radiology could have follow-up vascular study for aneurysm however at this time I do not feel patient needs the study emergently.He will need [AH]  1255 I reviewed the patient's EKG.  Shows A-fib which is rate controlled.  He has a history of A-fib with RVR.  I do not see that he is taking a blood thinner actively.  Requesting pain medications.  His blood pressure is soft.  This may be secondary to the fact that he has pulmonary edema, pulmonary hypertension as well although he could be somewhat dehydrated.  Appears to be in volume overload clinically.  I have ordered 50 of fentanyl IV. [AH]    Clinical Course User Index [AH] Arthor Captain, PA-C                                 Medical Decision Making Amount and/or Complexity of Data Reviewed Labs: ordered. Decision-making details documented in ED Course. Radiology: ordered. Decision-making details documented in ED Course.  Risk OTC drugs. Prescription drug management. Decision regarding hospitalization.   This patient presents to the ED with chief complaint(s) of diffuse body pain, diarrhea, altered mental status and shortness of breath with pertinent past medical history of chronic opiate dependence, sickle cell anemia, pulmonary hypertension, end-stage renal disease which further complicates the presenting complaint. The complaint involves an extensive differential diagnosis and treatment options and also carries with it a high risk of complications and morbidity.    The differential diagnosis includes uremia, volume overload, metabolic encephalopathy, infection, pulmonary edema, opiate withdrawal, sickle cell crisis.  The initial plan is to order labs  and to treat patient with temporizing agents for the patient's hyperkalemia.    Additional history obtained: Additional history obtained fromemr Records reviewed previous admission documents  Reassessment and review  (also see workup area): Lab Tests: I Ordered, and personally interpreted labs.  The pertinent results include: As per the ED course  Imaging Studies: I I ordered and interpreted images.  As per ED course Consultations Obtained: As per ED course.  Also discussed with Dr. Margot Ables who will admit the patient. Medicines ordered and prescription drug management: I ordered the following medications calcium gluconate, Lokelma 1 g, IV insulin and dextrose, bicarbonate for hyperkalemia I considered this additional medications:  Lasix however patient does not make urine   Reevaluation of the patient after these medicines showed that the patient    improved  Social Determinants of Health: Patient's  SDOH Screenings   Food Insecurity: No Food Insecurity (06/01/2023)  Housing: Low Risk  (06/01/2023)  Transportation Needs: No Transportation Needs (06/01/2023)  Utilities: Not At Risk (06/01/2023)  Alcohol Screen: Low Risk  (09/27/2022)  Depression (PHQ2-9): Low Risk  (10/30/2022)  Recent Concern: Depression (PHQ2-9) - High Risk (10/02/2022)  Financial Resource Strain: Low Risk  (09/27/2022)  Physical Activity: Inactive (09/27/2022)  Social Connections: Socially Isolated (09/27/2022)  Stress: No Stress Concern Present (09/27/2022)  Tobacco Use: Medium Risk (07/18/2023)     increases the complexity of managing their presentation  Cardiac Monitoring: The patient was maintained on a cardiac monitor.  I personally viewed and interpreted the cardiac monitor which showed an underlying rhythm of:  sinus tachycardia  Complexity of problems addressed: Patient's presentation is most consistent with  acute presentation with potential threat to life or bodily function During patient's assessment  Disposition: After consideration of the diagnostic results and the patient's response to treatment,  I feel that the patent would benefit from admission   .          Final Clinical Impression(s) / ED  Diagnoses Final diagnoses:  Other hypervolemia  Severe anemia  Hyperkalemia  Myalgia    Rx / DC Orders ED Discharge Orders     None         Arthor Captain, PA-C 07/18/23 1619    Virgina Norfolk, DO 07/22/23 [phone]

## 2023-07-18 NOTE — H&P (Signed)
History and Physical   HALLIS MEDITZ ZOX:[id number] DOB: 1956/01/06 DOA: 07/18/2023  PCP: Quentin Angst, MD   Patient coming from: Home  Chief Complaint: Abdominal pain  HPI: Patrick [name] is a 68 y.o. male with medical history significant of atrial fibrillation, chronic CHF, COPD, cirrhosis, GI bleed, sickle cell anemia, ESRD on HD, chronic pain, status post open cholecystectomy, status post small bowel resection, QT prolongation presenting with abdominal pain.  Patient reports significant abdominal pain since running out of pain medication.  Also reporting shortness of breath, weakness, hallucinations/confusion.  States that he ran out of his pain medications about a week ago and ha has developed some diffuse significant pain, decreased appetite and diarrhea since then.  Felt too bad to go to dialysis so he has not had dialysis in at least a week.  States that this pain is different from his typical sickle cell pain.  Reports some upper abdominal pain/right upper quadrant abdominal pain.  Family convinced him to be evaluated in the ED today.   Also reports some significant drowsiness, feeling tired. Denies fevers, chills, chest pain, constipation.  ED Course: Vital signs in ED notable for heart rate in the 90s to 100s and blood pressure in the 90s to 100 systolic.  Lab workup included CMP with potassium greater than 7.5, chloride 91, bicarb 17, gap 30, BUN 120, creatinine 15.2, AST 50, alk phos 159, T. bili 6.1.  CBC with hemoglobin 5.6, WBC 11, platelets stable at 107.  Reticulocyte count appropriately elevated at 9.9, lipase negative.  Rester panel for flu COVID RSV negative.  Patient typed and screened in the ED.  Chest x-ray with cardiomegaly and vascular congestion and possible new bone.  Patient received fentanyl, home pain medication, midodrine, insulin, dextrose, Lokelma, calcium gluconate, bicarb.  Nephrology was consulted and recommended above temporizing measures and  plan for dialysis this afternoon.  Review of Systems: As per HPI otherwise all other systems reviewed and are negative.  Past Medical History:  Diagnosis Date   A-fib [facility])    Atrial fibrillation with RVR (HCC) 07/23/2022   Blood dyscrasia    Blood transfusion    "I've had a bunch of them"   CHF (congestive heart failure) (HCC)    followed by hf clinic   Chronic kidney disease    Stage 5, dialysis on Tu/Th/Sa   Dialysis patient (HCC) 04/2019   AV fistula (right arm), TTS   Dysrhythmia    A-Fib per patient   Early satiety    Elevated LFTs 04/01/2022   Elevated troponin 04/01/2022   Empyema of gallbladder 05/27/2021   GERD (gastroesophageal reflux disease)    Gout    Legally blind    Metabolic acidosis with increased anion gap and accumulation of organic acids 04/01/2022   Mitral regurgitation    Muscle tightness-Right arm  05/27/2014   Pneumonia    Pulmonary hypertension (HCC)    Shortness of breath dyspnea    Sickle cell disease, type SS (HCC)    Sickle cell pain crisis (HCC) 11/21/2022   Swelling abdomen    Swelling of extremity, left    Swelling of extremity, right    Tricuspid regurgitation     Past Surgical History:  Procedure Laterality Date   A/V FISTULAGRAM Right 07/29/2020   Procedure: A/V FISTULAGRAM - Right Arm;  Surgeon: Chuck Hint, MD;  Location: [facility] INVASIVE CV LAB;  Service: Cardiovascular;  Laterality: Right;   BASCILIC VEIN TRANSPOSITION Right 04/12/2014   Procedure: BASILIC VEIN  TRANSPOSITION;  Surgeon: Sherren Kerns, MD;  Location: [facility] OR;  Service: Vascular;  Laterality: Right;   BILIARY STENT PLACEMENT  04/04/2022   Procedure: BILIARY STENT PLACEMENT;  Surgeon: Meryl Dare, MD;  Location: [facility] ENDOSCOPY;  Service: Gastroenterology;;   BIOPSY  08/04/2020   Procedure: BIOPSY;  Surgeon: Meryl Dare, MD;  Location: [facility] ENDOSCOPY;  Service: Gastroenterology;;   BIOPSY  06/28/2021   Procedure: BIOPSY;  Surgeon: Beverley Fiedler, MD;   Location: [facility] ENDOSCOPY;  Service: Gastroenterology;;   BIOPSY  04/02/2022   Procedure: BIOPSY;  Surgeon: Lemar Lofty., MD;  Location: [facility] ENDOSCOPY;  Service: Gastroenterology;;   BIOPSY  10/22/2022   Procedure: BIOPSY;  Surgeon: Beverley Fiedler, MD;  Location: [facility] ENDOSCOPY;  Service: Gastroenterology;;   BOWEL RESECTION N/A 05/24/2021   Procedure: SMALL BOWEL RESECTION;  Surgeon: Emelia Loron, MD;  Location: [facility] OR;  Service: General;  Laterality: N/A;   CARDIOVERSION N/A 06/05/2018   Procedure: CARDIOVERSION;  Surgeon: Dolores Patty, MD;  Location: [facility] ENDOSCOPY;  Service: Cardiovascular;  Laterality: N/A;   CARDIOVERSION N/A 10/06/2019   Procedure: CARDIOVERSION;  Surgeon: Dolores Patty, MD;  Location: [facility] ENDOSCOPY;  Service: Cardiovascular;  Laterality: N/A;   CHOLECYSTECTOMY N/A 05/24/2021   Procedure: OPEN CHOLECYSTECTOMY;  Surgeon: Emelia Loron, MD;  Location: [facility] OR;  Service: General;  Laterality: N/A;   COLONOSCOPY WITH PROPOFOL N/A 09/24/2017   Procedure: COLONOSCOPY WITH PROPOFOL;  Surgeon: Lynann Bologna, MD;  Location: [facility] ENDOSCOPY;  Service: Endoscopy;  Laterality: N/A;   ENDOSCOPIC RETROGRADE CHOLANGIOPANCREATOGRAPHY (ERCP) WITH PROPOFOL N/A 05/17/2022   Procedure: ENDOSCOPIC RETROGRADE CHOLANGIOPANCREATOGRAPHY (ERCP) WITH PROPOFOL;  Surgeon: Meridee Score Netty Starring., MD;  Location: [facility] ENDOSCOPY;  Service: Gastroenterology;  Laterality: N/A;   ERCP N/A 04/04/2022   Procedure: ENDOSCOPIC RETROGRADE CHOLANGIOPANCREATOGRAPHY (ERCP);  Surgeon: Meryl Dare, MD;  Location: [facility] ENDOSCOPY;  Service: Gastroenterology;  Laterality: N/A;   ESOPHAGOGASTRODUODENOSCOPY N/A 09/19/2017   Procedure: ESOPHAGOGASTRODUODENOSCOPY (EGD);  Surgeon: Lynann Bologna, MD;  Location: [facility] ENDOSCOPY;  Service: Endoscopy;  Laterality: N/A;   ESOPHAGOGASTRODUODENOSCOPY N/A 05/29/2021   Procedure: ESOPHAGOGASTRODUODENOSCOPY (EGD);  Surgeon: Rachael Fee, MD;  Location: [facility]  ENDOSCOPY;  Service: Gastroenterology;  Laterality: N/A;   ESOPHAGOGASTRODUODENOSCOPY (EGD) WITH PROPOFOL N/A 08/04/2020   Procedure: ESOPHAGOGASTRODUODENOSCOPY (EGD) WITH PROPOFOL;  Surgeon: Meryl Dare, MD;  Location: [facility] ENDOSCOPY;  Service: Gastroenterology;  Laterality: N/A;   ESOPHAGOGASTRODUODENOSCOPY (EGD) WITH PROPOFOL N/A 06/28/2021   Procedure: ESOPHAGOGASTRODUODENOSCOPY (EGD) WITH PROPOFOL;  Surgeon: Beverley Fiedler, MD;  Location: [facility] ENDOSCOPY;  Service: Gastroenterology;  Laterality: N/A;   ESOPHAGOGASTRODUODENOSCOPY (EGD) WITH PROPOFOL N/A 08/10/2021   Procedure: ESOPHAGOGASTRODUODENOSCOPY (EGD) WITH PROPOFOL;  Surgeon: Lynann Bologna, MD;  Location: [facility] ENDOSCOPY;  Service: Gastroenterology;  Laterality: N/A;   ESOPHAGOGASTRODUODENOSCOPY (EGD) WITH PROPOFOL N/A 08/02/2021   Procedure: ESOPHAGOGASTRODUODENOSCOPY (EGD) WITH PROPOFOL;  Surgeon: Shellia Cleverly, DO;  Location: [facility] ENDOSCOPY;  Service: Gastroenterology;  Laterality: N/A;   ESOPHAGOGASTRODUODENOSCOPY (EGD) WITH PROPOFOL N/A 04/02/2022   Procedure: ESOPHAGOGASTRODUODENOSCOPY (EGD) WITH PROPOFOL;  Surgeon: Meridee Score Netty Starring., MD;  Location: [facility] ENDOSCOPY;  Service: Gastroenterology;  Laterality: N/A;   ESOPHAGOGASTRODUODENOSCOPY (EGD) WITH PROPOFOL N/A 05/17/2022   Procedure: ESOPHAGOGASTRODUODENOSCOPY (EGD) WITH PROPOFOL;  Surgeon: Meridee Score Netty Starring., MD;  Location: [facility] ENDOSCOPY;  Service: Gastroenterology;  Laterality: N/A;   ESOPHAGOGASTRODUODENOSCOPY (EGD) WITH PROPOFOL N/A 10/22/2022   Procedure: ESOPHAGOGASTRODUODENOSCOPY (EGD) WITH PROPOFOL;  Surgeon: Beverley Fiedler, MD;  Location: [facility] ENDOSCOPY;  Service: Gastroenterology;  Laterality: N/A;   EUS N/A 05/17/2022  Procedure: UPPER ENDOSCOPIC ULTRASOUND (EUS) RADIAL;  Surgeon: Meridee Score Netty Starring., MD;  Location: [facility] ENDOSCOPY;  Service: Gastroenterology;  Laterality: N/A;   FINE NEEDLE ASPIRATION  04/02/2022   Procedure: FINE NEEDLE ASPIRATION (FNA) LINEAR;   Surgeon: Lemar Lofty., MD;  Location: [facility] ENDOSCOPY;  Service: Gastroenterology;;   FINE NEEDLE ASPIRATION N/A 05/17/2022   Procedure: FINE NEEDLE ASPIRATION (FNA) LINEAR;  Surgeon: Lemar Lofty., MD;  Location: [facility] ENDOSCOPY;  Service: Gastroenterology;  Laterality: N/A;   HEMOSTASIS CLIP PLACEMENT  05/29/2021   Procedure: HEMOSTASIS CLIP PLACEMENT;  Surgeon: Rachael Fee, MD;  Location: [facility] ENDOSCOPY;  Service: Gastroenterology;;   HOT HEMOSTASIS N/A 05/29/2021   Procedure: HOT HEMOSTASIS (ARGON PLASMA COAGULATION/BICAP);  Surgeon: Rachael Fee, MD;  Location: [facility] ENDOSCOPY;  Service: Gastroenterology;  Laterality: N/A;   HOT HEMOSTASIS N/A 06/28/2021   Procedure: HOT HEMOSTASIS (ARGON PLASMA COAGULATION/BICAP);  Surgeon: Beverley Fiedler, MD;  Location: [facility] ENDOSCOPY;  Service: Gastroenterology;  Laterality: N/A;   HOT HEMOSTASIS N/A 08/10/2021   Procedure: HOT HEMOSTASIS (ARGON PLASMA COAGULATION/BICAP);  Surgeon: Lynann Bologna, MD;  Location: [facility] ENDOSCOPY;  Service: Gastroenterology;  Laterality: N/A;   HOT HEMOSTASIS N/A 08/02/2021   Procedure: HOT HEMOSTASIS (ARGON PLASMA COAGULATION/BICAP);  Surgeon: Shellia Cleverly, DO;  Location: [facility] ENDOSCOPY;  Service: Gastroenterology;  Laterality: N/A;   IR PARACENTESIS  07/04/2022   IR PERC TUN PERIT CATH WO PORT S&I /IMAG  05/23/2021   IR REMOVAL TUN CV CATH W/O FL  06/10/2021   IR US GUIDE VASC ACCESS RIGHT  05/23/2021   LAPAROSCOPY N/A 05/24/2021   Procedure: LAPAROSCOPY DIAGNOSTIC;  Surgeon: Emelia Loron, MD;  Location: [facility] OR;  Service: General;  Laterality: N/A;   LAPAROTOMY N/A 05/24/2021   Procedure: EXPLORATORY LAPAROTOMY;  Surgeon: Emelia Loron, MD;  Location: [name] OR;  Service: General;  Laterality: N/A;   LEFT AND RIGHT HEART CATHETERIZATION WITH CORONARY ANGIOGRAM N/A 06/11/2011   Procedure: LEFT AND RIGHT HEART CATHETERIZATION WITH CORONARY ANGIOGRAM;  Surgeon: Laurey Morale, MD;  Location: [facility] CATH  LAB;  Service: Cardiovascular;  Laterality: N/A;   PERIPHERAL VASCULAR BALLOON ANGIOPLASTY Right 07/29/2020   Procedure: PERIPHERAL VASCULAR BALLOON ANGIOPLASTY;  Surgeon: Chuck Hint, MD;  Location: [facility] INVASIVE CV LAB;  Service: Cardiovascular;  Laterality: Right;  arm fistula   REMOVAL OF STONES  05/17/2022   Procedure: REMOVAL OF STONES;  Surgeon: Meridee Score Netty Starring., MD;  Location: [facility] ENDOSCOPY;  Service: Gastroenterology;;   REVISON OF ARTERIOVENOUS FISTULA Right 08/08/2020   Procedure: ANEURYSM EXCISION OF RIGHT UPPER EXTREMITY ARTERIOVENOUS FISTULA;  Surgeon: Chuck Hint, MD;  Location: [facility] OR;  Service: Vascular;  Laterality: Right;   REVISON OF ARTERIOVENOUS FISTULA Right 03/21/2022   Procedure: EXCISION OF ULCERATED SKIN OF RIGHT ARTERIOVENOUS FISTULA REVISION;  Surgeon: Cephus Shelling, MD;  Location: [facility] OR;  Service: Vascular;  Laterality: Right;   REVISON OF ARTERIOVENOUS FISTULA Right 07/02/2022   Procedure: EXCISION OF ULCERS RIGHT ARTERIOVENOUS FISTULA;  Surgeon: Chuck Hint, MD;  Location: [facility] OR;  Service: Vascular;  Laterality: Right;   SCLEROTHERAPY  05/29/2021   Procedure: Susa Day;  Surgeon: Rachael Fee, MD;  Location: [facility] ENDOSCOPY;  Service: Gastroenterology;;   Dennison Mascot  04/04/2022   Procedure: Dennison Mascot;  Surgeon: Meryl Dare, MD;  Location: [facility] ENDOSCOPY;  Service: Gastroenterology;;   Francine Graven REMOVAL  05/29/2021   Procedure: STENT REMOVAL;  Surgeon: Rachael Fee, MD;  Location: [facility] ENDOSCOPY;  Service: Gastroenterology;;   Francine Graven REMOVAL  05/17/2022  Procedure: STENT REMOVAL;  Surgeon: Lemar Lofty., MD;  Location: [facility] ENDOSCOPY;  Service: Gastroenterology;;   TEE WITHOUT CARDIOVERSION N/A 12/27/2015   Procedure: TRANSESOPHAGEAL ECHOCARDIOGRAM (TEE);  Surgeon: Quintella Reichert, MD;  Location: [facility] ENDOSCOPY;  Service: Cardiovascular;  Laterality: N/A;   UPPER ESOPHAGEAL ENDOSCOPIC ULTRASOUND (EUS) N/A  04/02/2022   Procedure: UPPER ESOPHAGEAL ENDOSCOPIC ULTRASOUND (EUS);  Surgeon: Lemar Lofty., MD;  Location: [facility] ENDOSCOPY;  Service: Gastroenterology;  Laterality: N/A;    Social History  reports that he quit smoking about 7 years ago. His smoking use included cigarettes. He started smoking about 48 years ago. He has a 20.5 pack-year smoking history. He has never used smokeless tobacco. He reports that he does not currently use alcohol after a past usage of about 3.0 standard drinks of alcohol per week. He reports that he does not use drugs.  No Known Allergies  Family History  Problem Relation Age of Onset   Alcohol abuse Mother    Liver disease Mother    Cirrhosis Mother    Heart attack Mother   Reviewed on admission  Prior to Admission medications   Medication Sig Start Date End Date Taking? Authorizing Provider  acetaminophen (TYLENOL) 500 MG tablet Take 1,500 mg by mouth 3 (three) times daily as needed for mild pain, moderate pain, fever or headache.   Yes [provider]  allopurinol (ZYLOPRIM) 100 MG tablet Take 1 tablet (100 mg total) by mouth Every Tuesday,Thursday,and Saturday with dialysis. 06/04/23  Yes Bender, Irving Burton, DO  AURYXIA 1 GM 210 MG(Fe) tablet Take 210-420 mg by mouth See admin instructions. 420 mg with every meal, 210 mg with each snack. 02/11/21  Yes [provider]  diphenhydramine-acetaminophen (TYLENOL PM) 25-500 MG TABS tablet Take 2 tablets by mouth at bedtime as needed (sleep/pain). 09/26/22  Yes Ivonne Andrew, NP  gabapentin (NEURONTIN) 100 MG capsule Take 1 capsule (100 mg total) by mouth 3 (three) times daily. 06/21/23  Yes Ivonne Andrew, NP  hydroxyurea (HYDREA) 500 MG capsule Take 1 capsule (500 mg total) by mouth daily. 05/12/23  Yes Ivonne Andrew, NP  midodrine (PROAMATINE) 10 MG tablet Take 1 tablet (10 mg total) by mouth 3 (three) times daily with meals. 06/04/23  Yes Bender, Irving Burton, DO  ofloxacin (OCUFLOX) 0.3 %  ophthalmic solution Place 1 drop into both eyes 2 (two) times daily. 03/31/23  Yes [provider]  oxyCODONE (OXYCONTIN) 10 mg 12 hr tablet Take 1 tablet (10 mg total) by mouth every 12 (twelve) hours. 05/12/23  Yes Ivonne Andrew, NP  oxyCODONE (ROXICODONE) 5 MG immediate release tablet Take 2 tablets (10 mg total) by mouth every 6 (six) hours as needed for severe pain (pain score 7-10) or breakthrough pain. 06/13/23  Yes Anders Simmonds T, DO  pantoprazole (PROTONIX) 40 MG tablet Take 1 tablet (40 mg total) by mouth daily. Use this prescription after done with twice daily dosing x 2 months 06/04/23  Yes Bender, Irving Burton, DO  prednisoLONE acetate (PRED FORTE) 1 % ophthalmic suspension Place 1 drop into both eyes 2 (two) times daily. 04/03/23  Yes [provider]  timolol (TIMOPTIC) 0.5 % ophthalmic solution Place 1 drop into both eyes 2 (two) times daily. 05/14/23  Yes [provider]  tiZANidine (ZANAFLEX) 4 MG tablet TAKE 1/2 TABLET (2MG  TOTAL) BY MOUTH EVERY 8 HOURS AS NEEDED FOR MUSCLE SPASM 05/12/23  Yes Ivonne Andrew, NP  cinacalcet (SENSIPAR) 30 MG tablet Take 1 tablet (30 mg total)  by mouth Every Tuesday,Thursday,and Saturday with dialysis. Patient not taking: Reported on 07/18/2023 06/12/22   Kendell Bane, MD  folic acid (FOLVITE) 1 MG tablet TAKE 1 TABLET BY MOUTH EVERY DAY Patient not taking: Reported on 07/18/2023 04/08/23   Massie Maroon, FNP  lidocaine-prilocaine (EMLA) cream Apply 1 application. topically Every Tuesday,Thursday,and Saturday with dialysis. Patient not taking: Reported on 07/18/2023 09/08/19   [provider]  torsemide (DEMADEX) 20 MG tablet TAKE 2 TABLETS BY MOUTH IN THE MORNING AND AT BEDTIME Patient not taking: Reported on 06/25/2023 01/09/23   Massie Maroon, FNP    Physical Exam: Vitals:   07/18/23 1345 07/18/23 1400 07/18/23 1415 07/18/23 1508  BP: (!) 89/74 111/69 99/75 90/76   Pulse:  (!) 105    Resp: 15 12 12     Temp:    (!) 97.3 F (36.3 C)  TempSrc:    Oral  SpO2:      Weight:        Physical Exam Constitutional:      General: He is not in acute distress.    Comments: Drowsy, Seen in HD  HENT:     Head: Normocephalic and atraumatic.     Mouth/Throat:     Mouth: Mucous membranes are moist.     Pharynx: Oropharynx is clear.  Eyes:     Extraocular Movements: Extraocular movements intact.     Pupils: Pupils are equal, round, and reactive to light.  Cardiovascular:     Rate and Rhythm: Tachycardia present. Rhythm irregular.     Pulses: Normal pulses.     Heart sounds: Normal heart sounds.  Pulmonary:     Effort: Pulmonary effort is normal. No respiratory distress.     Breath sounds: Rales present.  Abdominal:     General: Bowel sounds are normal. There is no distension.     Palpations: Abdomen is soft.     Tenderness: There is no abdominal tenderness.  Musculoskeletal:        General: No swelling or deformity.     Right lower leg: No edema.     Left lower leg: No edema.  Skin:    General: Skin is warm and dry.  Neurological:     General: No focal deficit present.     Mental Status: Mental status is at baseline.    Labs on Admission: I have personally reviewed following labs and imaging studies  CBC: Recent Labs  Lab 07/18/23 1142 07/18/23 1148  WBC 11.0*  --   NEUTROABS 7.9*  --   HGB 5.6* 6.5*  HCT 15.8* 19.0*  MCV 116.2*  --   PLT 107*  --     Basic Metabolic Panel: Recent Labs  Lab 07/18/23 1142 07/18/23 1148  NA 138 132*  K >7.5* 7.3*  CL 91* 102  CO2 17*  --   GLUCOSE 82 76  BUN 120* 117*  CREATININE 15.23* 15.60*  CALCIUM 9.4  --     GFR: Estimated Creatinine Clearance: 4.5 mL/min (A) (by C-G formula based on SCr of 15.6 mg/dL (H)).  Liver Function Tests: Recent Labs  Lab 07/18/23 1142  AST 50*  ALT 38  ALKPHOS 159*  BILITOT 6.1*  PROT 7.4  ALBUMIN 3.7    Urine analysis:    Component Value Date/Time   COLORURINE STRAW (A)  06/02/2018 1900   APPEARANCEUR CLEAR 06/02/2018 1900   APPEARANCEUR Clear 05/31/2017 0902   LABSPEC 1.006 06/02/2018 1900   PHURINE 7.0 06/02/2018 1900   GLUCOSEU NEGATIVE  06/02/2018 1900   HGBUR SMALL (A) 06/02/2018 1900   BILIRUBINUR neg 01/26/2020 1124   BILIRUBINUR Negative 05/31/2017 0902   KETONESUR negative 09/08/2018 1621   KETONESUR NEGATIVE 06/02/2018 1900   PROTEINUR Positive (A) 01/26/2020 1124   PROTEINUR 30 (A) 06/02/2018 1900   UROBILINOGEN 1.0 01/26/2020 1124   UROBILINOGEN 0.2 09/14/2013 1354   NITRITE neg 01/26/2020 1124   NITRITE NEGATIVE 06/02/2018 1900   LEUKOCYTESUR Negative 01/26/2020 1124   LEUKOCYTESUR Negative 05/31/2017 0902    Radiological Exams on Admission: DG Chest Portable 1 View Result Date: 07/18/2023 CLINICAL DATA:  Abdominal pain, weakness EXAM: PORTABLE CHEST 1 VIEW COMPARISON:  06/13/2023 FINDINGS: Cardiomegaly, aortic atherosclerosis. Rounded density superior to the aortic arch in the left mediastinum. Based on prior CT, I favor this reflects the left innominate vein, but difficult to completely exclude aneurysm. Mild vascular congestion and interstitial prominence which could reflect interstitial edema. No effusions or acute bony abnormality. IMPRESSION: Cardiomegaly, vascular congestion. Question mild interstitial edema. Rounded density in the left mediastinum superior to the aortic arch. Comparing to prior chest CT, I favor this reflects tortuous left innominate vein, but difficult to completely exclude arch or great vessel aneurysm. This could be further evaluated chest CT with IV contrast. Electronically Signed   By: Charlett Nose M.D.   On: 07/18/2023 11:47   EKG: Independently reviewed. Atrial fibrillation at 92 bpm.  PVCs noted.  Right bundle branch block with QRS 135.  T waves peaked compared to previous.  Assessment/Plan Active Problems:   Sickle cell disease (HCC)   ESRD (end stage renal disease) (HCC)   Cirrhosis (HCC)   Permanent  atrial fibrillation (HCC)   COPD (chronic obstructive pulmonary disease) (HCC)   Anemia   Chronic pain syndrome   Volume overload   Acute on chronic systolic CHF (congestive heart failure) (HCC)   Volume overload ESRD on HD Hyperkalemia Uremia > Has not been attending dialysis for at least a week due to feeling unwell/in pain since running out of pain medication. > Labs on arrival significant for potassium greater than 7.5, bicarb 17, anion gap 30, BUN 120, creatinine 15.2. > Has reported some abnormal mentation and weakness which could be associated with uremia in addition to feeling unwell from chronic pain. > Imaging with evidence of volume overload as well as exam. > Nephrology consulted in the ED, temporizing measures for hyperkalemia included insulin, dextrose, Lokelma, calcium gluconate, bicarb.  Patient going for HD today. - Monitor on progressive unit - Appreciate nephrology recommendations and assistance - Volume removal and electrolyte correction with dialysis - Trend renal function and electrolytes  Acute on chronic systolic CHF > Volume overloaded as above.  Vascular congestion and cardiomegaly and questionable edema on chest x-ray.  Volume overloaded on exam.  BNP pending. > Last echo was last month with EF 40-40%, global hypokinesis, indeterminate diastolic function, mildly reduced RV function, severe TR. - Volume removal with dialysis - Trend renal function and electrolytes - I's and O's, daily weights  Acute on chronic anemia Sickle cell disease > Having some worsening of pain off of chronic pain medications but reports does not feel like typical sickle cell pain crisis. > Anemia is worsening, currently 5.6 likely some dilutional component however was planned for outpatient transfusion already.  Per chart review current transfusion threshold is 6 unless symptomatic. > Reticulocyte count elevated at 9.9. > Will proceed with 1 unit transfusion with dialysis. - 1 unit  PRBC hopefully able to coordinate with HD, has been  ordered.  Type and screen already completed in the ED. - Continue home hydroxyurea and folate - Trend CBC  Chronic pain > Developed diffuse pain with decreased appetite and diarrhea after running out of pain medication about a week ago.  Likely represents withdrawal. > Reporting some abdominal pain at ventral upper abdomen to right upper quadrant. > Received fentanyl in the ED and started on home dose long-acting oxycodone - Continue long-acting oxycodone - Replace as needed oxycodone with Norco given ESRD status-continue home gabapentin - Supportive care  Cirrhosis > History of cirrhosis listed.  Inconsistent imaging radiates but likely consistent with cirrhosis.  Current AST 50, alk phos 159, T. bili 6.1, platelets 107. > Abdominal pain and bilirubin elevation considering right upper quadrant ultrasound however we will add on LFTs to differentiate between direct and indirect bilirubin in the setting of worsening anemia and sickle cell disease. > Of note, patient is status post open cholecystectomy in the setting of gangrenous cholecystitis and status post small bowel resection in setting SBO. - LFTs to breakdown bilirubin - Right upper quadrant ultrasound  Atrial fibrillation > Not currently on any rate control, rhythm control medications > Off of anticoagulation secondary to GI bleeding. > Some tachycardia in HD, will monitor how rate looks after - Continue to monitor  COPD - Listed in chart but on no medications for this  QTc prolongation > Initial EKG with QTc 532. - Avoid QTc prolonging medications - Repeat EKG this evening  DVT prophylaxis: Heparin Code Status:   Full Family Communication:  None on admission  Disposition Plan:   Patient is from:  Home  Anticipated DC to:  Home  Anticipated DC date:  2 to 4 days  Anticipated DC barriers: None  Consults called:  Nephrology Admission status:  Inpatient,  progressive  Severity of Illness: The appropriate patient status for this patient is INPATIENT. Inpatient status is judged to be reasonable and necessary in order to provide the required intensity of service to ensure the patient's safety. The patient's presenting symptoms, physical exam findings, and initial radiographic and laboratory data in the context of their chronic comorbidities is felt to place them at high risk for further clinical deterioration. Furthermore, it is not anticipated that the patient will be medically stable for discharge from the hospital within 2 midnights of admission.   * I certify that at the point of admission it is my clinical judgment that the patient will require inpatient hospital care spanning beyond 2 midnights from the point of admission due to high intensity of service, high risk for further deterioration and high frequency of surveillance required.[name] MD [facility]  How to contact the [facility] Attending or Consulting provider 7A - 7P or covering provider during after hours 7P -7A, for this patient?   Check the care team in [facility] and look for a) attending/consulting TR[facility] provider listed and b) the [facility] team listed Log into [email or web address] and use [facility] universal password to access. If you do not have the password, please contact the hospital operator. Locate the [facility] provider you are looking for under [facility] and page to a number that you can be directly reached. If you still have difficulty reaching the provider, please page the [facility] (Director on Call) for the Hospitalists listed on amion for assistance.  07/18/2023, 3:12 PM

## 2023-07-18 NOTE — Consult Note (Signed)
Patrick [name] Admit Date: 07/18/2023 07/18/2023 Patrick [name] Requesting Physician:  Tiburcio Pea PA, EDP  Reason for Consult:  ESRD, Hyperkalemia HPI:  35M ESRD THS at [facility] presented to the ED earlier today with abdominal pain after running out of her medication but also having dyspnea, weakness.    PMH Incudes: Sickle cell anemia Chronic atrial fibrillation Chronic CHF COPD History of cirrhosis History of GI bleed Chronic pain History of cholecystectomy History of small bowel resection  Last dialysis 07/11/2023 where he left 1.7 kg above EDW, leaving 30 minutes prior to full treatment.  He states that he was in pain after running out of pain medicines and could not come to dialysis.  Outpatient HD Rx: 4h49min, [place], THS, 2K, 2Ca, EDW 65.3kg, BFR 400, DFR 500, RUE AVG, no heparin.  Mircera 225 q2wk. No Fe. Hectoral 9 qTx.  Cinacalcet 30 mg q. Treatment.  He has chronic hypotension but does tolerate significant UF.  In ED potassium was greater than 7.5, BUN 120, hemoglobin 5.6.  Medically treated for hyperkalemia including Lokelma, calcium gluconate, insulin/dextrose, bicarbonate.  EKG with atrial fibrillation without peaked T waves.  Portable chest x-ray with cardiomegaly with vascular congestion.  ] ROS Balance of 12 systems is negative w/ exceptions as above  PMH  Past Medical History:  Diagnosis Date   A-fib [facility])    Atrial fibrillation with RVR (HCC) 07/23/2022   Blood dyscrasia    Blood transfusion    "I've had a bunch of them"   CHF (congestive heart failure) (HCC)    followed by hf clinic   Chronic kidney disease    Stage 5, dialysis on Tu/Th/Sa   Dialysis patient (HCC) 04/2019   AV fistula (right arm), TTS   Dysrhythmia    A-Fib per patient   Early satiety    Elevated LFTs 04/01/2022   Elevated troponin 04/01/2022   Empyema of gallbladder 05/27/2021   GERD (gastroesophageal reflux disease)    Gout    Legally blind    Metabolic acidosis with  increased anion gap and accumulation of organic acids 04/01/2022   Mitral regurgitation    Muscle tightness-Right arm  05/27/2014   Pneumonia    Pulmonary hypertension (HCC)    Shortness of breath dyspnea    Sickle cell disease, type SS (HCC)    Sickle cell pain crisis (HCC) 11/21/2022   Swelling abdomen    Swelling of extremity, left    Swelling of extremity, right    Tricuspid regurgitation    PSH  Past Surgical History:  Procedure Laterality Date   A/V FISTULAGRAM Right 07/29/2020   Procedure: A/V FISTULAGRAM - Right Arm;  Surgeon: Chuck Hint, MD;  Location[facility] INVASIVE CV LAB;  Service: Cardiovascular;  Laterality: Right;   BASCILIC VEIN TRANSPOSITION Right 04/12/2014   Procedure: BASILIC VEIN TRANSPOSITION;  Surgeon: Sherren Kerns, MD;  Location: [facility] OR;  Service: Vascular;  Laterality: Right;   BILIARY STENT PLACEMENT  04/04/2022   Procedure: BILIARY STENT PLACEMENT;  Surgeon: Meryl Dare, MD;  Location: [facility] ENDOSCOPY;  Service: Gastroenterology;;   BIOPSY  08/04/2020   Procedure: BIOPSY;  Surgeon: Meryl Dare, MD;  Location: [facility] ENDOSCOPY;  Service: Gastroenterology;;   BIOPSY  06/28/2021   Procedure: BIOPSY;  Surgeon: Beverley Fiedler, MD;  Location: [facility] ENDOSCOPY;  Service: Gastroenterology;;   BIOPSY  04/02/2022   Procedure: BIOPSY;  Surgeon: Lemar Lofty., MD;  Location: [facility] ENDOSCOPY;  Service: Gastroenterology;;   BIOPSY  10/22/2022  Procedure: BIOPSY;  Surgeon: Beverley Fiedler, MD;  Location: [facility] ENDOSCOPY;  Service: Gastroenterology;;   BOWEL RESECTION N/A 05/24/2021   Procedure: SMALL BOWEL RESECTION;  Surgeon: Emelia Loron, MD;  Location: [facility] OR;  Service: General;  Laterality: N/A;   CARDIOVERSION N/A 06/05/2018   Procedure: CARDIOVERSION;  Surgeon: Dolores Patty, MD;  Location: [facility] ENDOSCOPY;  Service: Cardiovascular;  Laterality: N/A;   CARDIOVERSION N/A 10/06/2019   Procedure: CARDIOVERSION;  Surgeon: Dolores Patty, MD;   Location: [facility] ENDOSCOPY;  Service: Cardiovascular;  Laterality: N/A;   CHOLECYSTECTOMY N/A 05/24/2021   Procedure: OPEN CHOLECYSTECTOMY;  Surgeon: Emelia Loron, MD;  Location: [facility] OR;  Service: General;  Laterality: N/A;   COLONOSCOPY WITH PROPOFOL N/A 09/24/2017   Procedure: COLONOSCOPY WITH PROPOFOL;  Surgeon: Lynann Bologna, MD;  Location: [facility] ENDOSCOPY;  Service: Endoscopy;  Laterality: N/A;   ENDOSCOPIC RETROGRADE CHOLANGIOPANCREATOGRAPHY (ERCP) WITH PROPOFOL N/A 05/17/2022   Procedure: ENDOSCOPIC RETROGRADE CHOLANGIOPANCREATOGRAPHY (ERCP) WITH PROPOFOL;  Surgeon: Meridee Score Netty Starring., MD;  Location: [facility] ENDOSCOPY;  Service: Gastroenterology;  Laterality: N/A;   ERCP N/A 04/04/2022   Procedure: ENDOSCOPIC RETROGRADE CHOLANGIOPANCREATOGRAPHY (ERCP);  Surgeon: Meryl Dare, MD;  Location: [facility] ENDOSCOPY;  Service: Gastroenterology;  Laterality: N/A;   ESOPHAGOGASTRODUODENOSCOPY N/A 09/19/2017   Procedure: ESOPHAGOGASTRODUODENOSCOPY (EGD);  Surgeon: Lynann Bologna, MD;  Location: [facility] ENDOSCOPY;  Service: Endoscopy;  Laterality: N/A;   ESOPHAGOGASTRODUODENOSCOPY N/A 05/29/2021   Procedure: ESOPHAGOGASTRODUODENOSCOPY (EGD);  Surgeon: Rachael Fee, MD;  Location: [facility] ENDOSCOPY;  Service: Gastroenterology;  Laterality: N/A;   ESOPHAGOGASTRODUODENOSCOPY (EGD) WITH PROPOFOL N/A 08/04/2020   Procedure: ESOPHAGOGASTRODUODENOSCOPY (EGD) WITH PROPOFOL;  Surgeon: Meryl Dare, MD;  Location: [facility] ENDOSCOPY;  Service: Gastroenterology;  Laterality: N/A;   ESOPHAGOGASTRODUODENOSCOPY (EGD) WITH PROPOFOL N/A 06/28/2021   Procedure: ESOPHAGOGASTRODUODENOSCOPY (EGD) WITH PROPOFOL;  Surgeon: Beverley Fiedler, MD;  Location: [facility] ENDOSCOPY;  Service: Gastroenterology;  Laterality: N/A;   ESOPHAGOGASTRODUODENOSCOPY (EGD) WITH PROPOFOL N/A 08/10/2021   Procedure: ESOPHAGOGASTRODUODENOSCOPY (EGD) WITH PROPOFOL;  Surgeon: Lynann Bologna, MD;  Location: [facility] ENDOSCOPY;  Service: Gastroenterology;  Laterality: N/A;    ESOPHAGOGASTRODUODENOSCOPY (EGD) WITH PROPOFOL N/A 08/02/2021   Procedure: ESOPHAGOGASTRODUODENOSCOPY (EGD) WITH PROPOFOL;  Surgeon: Shellia Cleverly, DO;  Location: [facility] ENDOSCOPY;  Service: Gastroenterology;  Laterality: N/A;   ESOPHAGOGASTRODUODENOSCOPY (EGD) WITH PROPOFOL N/A 04/02/2022   Procedure: ESOPHAGOGASTRODUODENOSCOPY (EGD) WITH PROPOFOL;  Surgeon: Meridee Score Netty Starring., MD;  Location: [facility] ENDOSCOPY;  Service: Gastroenterology;  Laterality: N/A;   ESOPHAGOGASTRODUODENOSCOPY (EGD) WITH PROPOFOL N/A 05/17/2022   Procedure: ESOPHAGOGASTRODUODENOSCOPY (EGD) WITH PROPOFOL;  Surgeon: Meridee Score Netty Starring., MD;  Location: [facility] ENDOSCOPY;  Service: Gastroenterology;  Laterality: N/A;   ESOPHAGOGASTRODUODENOSCOPY (EGD) WITH PROPOFOL N/A 10/22/2022   Procedure: ESOPHAGOGASTRODUODENOSCOPY (EGD) WITH PROPOFOL;  Surgeon: Beverley Fiedler, MD;  Location: [facility] ENDOSCOPY;  Service: Gastroenterology;  Laterality: N/A;   EUS N/A 05/17/2022   Procedure: UPPER ENDOSCOPIC ULTRASOUND (EUS) RADIAL;  Surgeon: Lemar Lofty., MD;  Location: [facility] ENDOSCOPY;  Service: Gastroenterology;  Laterality: N/A;   FINE NEEDLE ASPIRATION  04/02/2022   Procedure: FINE NEEDLE ASPIRATION (FNA) LINEAR;  Surgeon: Lemar Lofty., MD;  Location: [facility] ENDOSCOPY;  Service: Gastroenterology;;   FINE NEEDLE ASPIRATION N/A 05/17/2022   Procedure: FINE NEEDLE ASPIRATION (FNA) LINEAR;  Surgeon: Lemar Lofty., MD;  Location: [facility] ENDOSCOPY;  Service: Gastroenterology;  Laterality: N/A;   HEMOSTASIS CLIP PLACEMENT  05/29/2021   Procedure: HEMOSTASIS CLIP PLACEMENT;  Surgeon: Rachael Fee, MD;  Location: [facility] ENDOSCOPY;  Service: Gastroenterology;;   HOT HEMOSTASIS N/A 05/29/2021   Procedure:  HOT HEMOSTASIS (ARGON PLASMA COAGULATION/BICAP);  Surgeon: Rachael Fee, MD;  Location: [facility] ENDOSCOPY;  Service: Gastroenterology;  Laterality: N/A;   HOT HEMOSTASIS N/A 06/28/2021   Procedure: HOT HEMOSTASIS (ARGON PLASMA  COAGULATION/BICAP);  Surgeon: Beverley Fiedler, MD;  Location: [facility] ENDOSCOPY;  Service: Gastroenterology;  Laterality: N/A;   HOT HEMOSTASIS N/A 08/10/2021   Procedure: HOT HEMOSTASIS (ARGON PLASMA COAGULATION/BICAP);  Surgeon: Lynann Bologna, MD;  Location: [facility] ENDOSCOPY;  Service: Gastroenterology;  Laterality: N/A;   HOT HEMOSTASIS N/A 08/02/2021   Procedure: HOT HEMOSTASIS (ARGON PLASMA COAGULATION/BICAP);  Surgeon: Shellia Cleverly, DO;  Location: [facility] ENDOSCOPY;  Service: Gastroenterology;  Laterality: N/A;   IR PARACENTESIS  07/04/2022   IR PERC TUN PERIT CATH WO PORT S&I /IMAG  05/23/2021   IR REMOVAL TUN CV CATH W/O FL  06/10/2021   IR US GUIDE VASC ACCESS RIGHT  05/23/2021   LAPAROSCOPY N/A 05/24/2021   Procedure: LAPAROSCOPY DIAGNOSTIC;  Surgeon: Emelia Loron, MD;  Location: [facility] OR;  Service: General;  Laterality: N/A;   LAPAROTOMY N/A 05/24/2021   Procedure: EXPLORATORY LAPAROTOMY;  Surgeon: Emelia Loron, MD;  Location: [facility] OR;  Service: General;  Laterality: N/A;   LEFT AND RIGHT HEART CATHETERIZATION WITH CORONARY ANGIOGRAM N/A 06/11/2011   Procedure: LEFT AND RIGHT HEART CATHETERIZATION WITH CORONARY ANGIOGRAM;  Surgeon: Laurey Morale, MD;  Location: [facility] CATH LAB;  Service: Cardiovascular;  Laterality: N/A;   PERIPHERAL VASCULAR BALLOON ANGIOPLASTY Right 07/29/2020   Procedure: PERIPHERAL VASCULAR BALLOON ANGIOPLASTY;  Surgeon: Chuck Hint, MD;  Location: [facility] INVASIVE CV LAB;  Service: Cardiovascular;  Laterality: Right;  arm fistula   REMOVAL OF STONES  05/17/2022   Procedure: REMOVAL OF STONES;  Surgeon: Meridee Score Netty Starring., MD;  Location: [facility] ENDOSCOPY;  Service: Gastroenterology;;   REVISON OF ARTERIOVENOUS FISTULA Right 08/08/2020   Procedure: ANEURYSM EXCISION OF RIGHT UPPER EXTREMITY ARTERIOVENOUS FISTULA;  Surgeon: Chuck Hint, MD;  Location: [facility] OR;  Service: Vascular;  Laterality: Right;   REVISON OF ARTERIOVENOUS FISTULA Right 03/21/2022    Procedure: EXCISION OF ULCERATED SKIN OF RIGHT ARTERIOVENOUS FISTULA REVISION;  Surgeon: Cephus Shelling, MD;  Location: [facility] OR;  Service: Vascular;  Laterality: Right;   REVISON OF ARTERIOVENOUS FISTULA Right 07/02/2022   Procedure: EXCISION OF ULCERS RIGHT ARTERIOVENOUS FISTULA;  Surgeon: Chuck Hint, MD;  Location: [facility] OR;  Service: Vascular;  Laterality: Right;   SCLEROTHERAPY  05/29/2021   Procedure: Susa Day;  Surgeon: Rachael Fee, MD;  Location: [facility] ENDOSCOPY;  Service: Gastroenterology;;   Dennison Mascot  04/04/2022   Procedure: SPHINCTEROTOMY;  Surgeon: Meryl Dare, MD;  Location: [facility] ENDOSCOPY;  Service: Gastroenterology;;   Francine Graven REMOVAL  05/29/2021   Procedure: STENT REMOVAL;  Surgeon: Rachael Fee, MD;  Location: [facility] ENDOSCOPY;  Service: Gastroenterology;;   Francine Graven REMOVAL  05/17/2022   Procedure: STENT REMOVAL;  Surgeon: Lemar Lofty., MD;  Location: [facility] ENDOSCOPY;  Service: Gastroenterology;;   TEE WITHOUT CARDIOVERSION N/A 12/27/2015   Procedure: TRANSESOPHAGEAL ECHOCARDIOGRAM (TEE);  Surgeon: Quintella Reichert, MD;  Location: [facility] ENDOSCOPY;  Service: Cardiovascular;  Laterality: N/A;   UPPER ESOPHAGEAL ENDOSCOPIC ULTRASOUND (EUS) N/A 04/02/2022   Procedure: UPPER ESOPHAGEAL ENDOSCOPIC ULTRASOUND (EUS);  Surgeon: Lemar Lofty., MD;  Location: [facility] ENDOSCOPY;  Service: Gastroenterology;  Laterality: N/A;   FH  Family History  Problem Relation Age of Onset   Alcohol abuse Mother    Liver disease Mother    Cirrhosis Mother    Heart attack Mother    [facility]  reports that he quit smoking about 7 years ago. His smoking use included cigarettes. He started smoking about 48 years ago. He has a 20.5 pack-year smoking history. He has never used smokeless tobacco. He reports that he does not currently use alcohol after a past usage of about 3.0 standard drinks of alcohol per week. He reports that he does not use drugs. Allergies No Known  Allergies Home medications Prior to Admission medications   Medication Sig Start Date End Date Taking? Authorizing Provider  acetaminophen (TYLENOL) 500 MG tablet Take 1,500 mg by mouth 3 (three) times daily as needed for mild pain, moderate pain, fever or headache.   Yes [provider]  allopurinol (ZYLOPRIM) 100 MG tablet Take 1 tablet (100 mg total) by mouth Every Tuesday,Thursday,and Saturday with dialysis. 06/04/23  Yes Bender, Irving Burton, DO  AURYXIA 1 GM 210 MG(Fe) tablet Take 210-420 mg by mouth See admin instructions. 420 mg with every meal, 210 mg with each snack. 02/11/21  Yes [provider]  diphenhydramine-acetaminophen (TYLENOL PM) 25-500 MG TABS tablet Take 2 tablets by mouth at bedtime as needed (sleep/pain). 09/26/22  Yes Ivonne Andrew, NP  gabapentin (NEURONTIN) 100 MG capsule Take 1 capsule (100 mg total) by mouth 3 (three) times daily. 06/21/23  Yes Ivonne Andrew, NP  hydroxyurea (HYDREA) 500 MG capsule Take 1 capsule (500 mg total) by mouth daily. 05/12/23  Yes Ivonne Andrew, NP  midodrine (PROAMATINE) 10 MG tablet Take 1 tablet (10 mg total) by mouth 3 (three) times daily with meals. 06/04/23  Yes Bender, Irving Burton, DO  ofloxacin (OCUFLOX) 0.3 % ophthalmic solution Place 1 drop into both eyes 2 (two) times daily. 03/31/23  Yes [provider]  oxyCODONE (OXYCONTIN) 10 mg 12 hr tablet Take 1 tablet (10 mg total) by mouth every 12 (twelve) hours. 05/12/23  Yes Ivonne Andrew, NP  oxyCODONE (ROXICODONE) 5 MG immediate release tablet Take 2 tablets (10 mg total) by mouth every 6 (six) hours as needed for severe pain (pain score 7-10) or breakthrough pain. 06/13/23  Yes Anders Simmonds T, DO  pantoprazole (PROTONIX) 40 MG tablet Take 1 tablet (40 mg total) by mouth daily. Use this prescription after done with twice daily dosing x 2 months 06/04/23  Yes Bender, Irving Burton, DO  prednisoLONE acetate (PRED FORTE) 1 % ophthalmic suspension Place 1 drop into both eyes  2 (two) times daily. 04/03/23  Yes [provider]  timolol (TIMOPTIC) 0.5 % ophthalmic solution Place 1 drop into both eyes 2 (two) times daily. 05/14/23  Yes [provider]  tiZANidine (ZANAFLEX) 4 MG tablet TAKE 1/2 TABLET (2MG  TOTAL) BY MOUTH EVERY 8 HOURS AS NEEDED FOR MUSCLE SPASM 05/12/23  Yes Ivonne Andrew, NP  cinacalcet (SENSIPAR) 30 MG tablet Take 1 tablet (30 mg total) by mouth Every Tuesday,Thursday,and Saturday with dialysis. Patient not taking: Reported on 07/18/2023 06/12/22   Kendell Bane, MD  folic acid (FOLVITE) 1 MG tablet TAKE 1 TABLET BY MOUTH EVERY DAY Patient not taking: Reported on 07/18/2023 04/08/23   Massie Maroon, FNP  lidocaine-prilocaine (EMLA) cream Apply 1 application. topically Every Tuesday,Thursday,and Saturday with dialysis. Patient not taking: Reported on 07/18/2023 09/08/19   [provider]  torsemide (DEMADEX) 20 MG tablet TAKE 2 TABLETS BY MOUTH IN THE MORNING AND AT BEDTIME Patient not taking: Reported on 06/25/2023 01/09/23   Massie Maroon, FNP    Current Medications Scheduled Meds:  sodium chloride   Intravenous Once   [START  ON 07/19/2023] Chlorhexidine Gluconate Cloth  6 each Topical Q0600   [START ON 07/19/2023] folic acid  1 mg Oral Daily   gabapentin  100 mg Oral TID   heparin  5,000 Units Subcutaneous Q8H   [START ON 07/19/2023] hydroxyurea  500 mg Oral Daily   [START ON 07/19/2023] midodrine  10 mg Oral TID WC   oxyCODONE  10 mg Oral Q12H   [START ON 07/19/2023] pantoprazole  40 mg Oral Daily   sodium chloride flush  3 mL Intravenous Q12H   Continuous Infusions:  albumin human     PRN Meds:.acetaminophen **OR** acetaminophen, HYDROcodone-acetaminophen, lidocaine (PF), lidocaine-prilocaine, pentafluoroprop-tetrafluoroeth, polyethylene glycol  CBC Recent Labs  Lab 07/18/23 1142 07/18/23 1148  WBC 11.0*  --   NEUTROABS 7.9*  --   HGB 5.6* 6.5*  HCT 15.8* 19.0*  MCV 116.2*  --   PLT 107*  --     Basic Metabolic Panel Recent Labs  Lab 07/18/23 1142 07/18/23 1148  NA 138 132*  K >7.5* 7.3*  CL 91* 102  CO2 17*  --   GLUCOSE 82 76  BUN 120* 117*  CREATININE 15.23* 15.60*  CALCIUM 9.4  --     Physical Exam  Blood pressure 94/63, pulse (!) 130, temperature (!) 97.3 F (36.3 C), temperature source Oral, resp. rate 12, weight 67.1 kg, SpO2 97%. GEN: Chronically ill-appearing but no acute distress ENT: NCAT EYES: EOMI CV: Irregular, tachycardic PULM: Diminished throughout, normal work of breathing ABD: Soft, nontender VASCULAR: RUE AVG with bruit and thrill SKIN: No rashes or lesions EXT: Trace to 1+ lower extremity edema  Assessment 16M ESRD here with severe hyperkalemia, uremia after missing dialysis, worsened acute on chronic anemia with history of sickle cell disease.  ESRD THS on HD RUE AVG [place] Severe hyperkalemia due to missed dialysis Worsened anemia with hemoglobin 5.6 at presentation, sickle cell disease Vascular congestion, hypervolemia Chronic hypotension using midodrine Secondary hyperparathyroidism controlled on Sensipar and Cinacalcet, chronic hyperphosphatemia Acute on chronic systolic heart failure Chronic pain History of cirrhosis  Plan Dialysis immediately using 2K bath, goal UF of 4 L reported by midodrine and albumin as an AVG Continue VDR A, Cinacalcet, binders Transfusion per admitting service, not due for ESA Will follow along   Patrick [name]  07/18/2023, 4:13 PM

## 2023-07-18 NOTE — ED Notes (Signed)
Lab at [facility]. Pending transport for HD.

## 2023-07-18 NOTE — ED Notes (Signed)
Med rec tech at [facility], pt alert, NAD, calm, interactive, resps e/u

## 2023-07-19 DIAGNOSIS — N186 End stage renal disease: Secondary | ICD-10-CM | POA: Diagnosis not present

## 2023-07-19 DIAGNOSIS — I158 Other secondary hypertension: Secondary | ICD-10-CM | POA: Diagnosis not present

## 2023-07-19 DIAGNOSIS — Z992 Dependence on renal dialysis: Secondary | ICD-10-CM | POA: Diagnosis not present

## 2023-07-19 DIAGNOSIS — K7460 Unspecified cirrhosis of liver: Secondary | ICD-10-CM | POA: Diagnosis not present

## 2023-07-19 DIAGNOSIS — I4821 Permanent atrial fibrillation: Secondary | ICD-10-CM | POA: Diagnosis not present

## 2023-07-19 DIAGNOSIS — D5700 Hb-SS disease with crisis, unspecified: Secondary | ICD-10-CM | POA: Diagnosis not present

## 2023-07-19 LAB — COMPREHENSIVE METABOLIC PANEL
ALT: 38 U/L (ref 0–44)
AST: 48 U/L — ABNORMAL HIGH (ref 15–41)
Albumin: 3.6 g/dL (ref 3.5–5.0)
Alkaline Phosphatase: 130 U/L — ABNORMAL HIGH (ref 38–126)
Anion gap: 16 — ABNORMAL HIGH (ref 5–15)
BUN: 51 mg/dL — ABNORMAL HIGH (ref 8–23)
CO2: 26 mmol/L (ref 22–32)
Calcium: 9 mg/dL (ref 8.9–10.3)
Chloride: 90 mmol/L — ABNORMAL LOW (ref 98–111)
Creatinine, Ser: 7.67 mg/dL — ABNORMAL HIGH (ref 0.61–1.24)
GFR, Estimated: 7 mL/min — ABNORMAL LOW (ref >60)
Glucose, Bld: 143 mg/dL — ABNORMAL HIGH (ref 70–99)
Potassium: 3.9 mmol/L (ref 3.5–5.1)
Sodium: 132 mmol/L — ABNORMAL LOW (ref 135–145)
Total Bilirubin: 6.3 mg/dL — ABNORMAL HIGH (ref 0.0–1.2)
Total Protein: 7.1 g/dL (ref 6.5–8.1)

## 2023-07-19 LAB — CBC
HCT: 17.2 % — ABNORMAL LOW (ref 39.0–52.0)
Hemoglobin: 6.4 g/dL — CL (ref 13.0–17.0)
MCH: 39 pg — ABNORMAL HIGH (ref 26.0–34.0)
MCHC: 37.2 g/dL — ABNORMAL HIGH (ref 30.0–36.0)
MCV: 104.9 fL — ABNORMAL HIGH (ref 80.0–100.0)
Platelets: 124 10*3/uL — ABNORMAL LOW (ref 150–400)
RBC: 1.64 MIL/uL — ABNORMAL LOW (ref 4.22–5.81)
RDW: 21.9 % — ABNORMAL HIGH (ref 11.5–15.5)
WBC: 9.1 10*3/uL (ref 4.0–10.5)
nRBC: 75 % — ABNORMAL HIGH (ref 0.0–0.2)

## 2023-07-19 LAB — HEMOGLOBIN AND HEMATOCRIT, BLOOD
HCT: 18.3 % — ABNORMAL LOW (ref 39.0–52.0)
Hemoglobin: 6.9 g/dL — CL (ref 13.0–17.0)

## 2023-07-19 MED ORDER — ONDANSETRON HCL 4 MG/2ML IJ SOLN: 4.0000 mg | Freq: Four times a day (QID) | INTRAMUSCULAR | Status: DC | PRN

## 2023-07-19 MED ORDER — OXYCODONE HCL 5 MG PO TABS
15.0000 mg | ORAL_TABLET | Freq: Four times a day (QID) | ORAL | Status: DC | PRN
  Administered 2023-07-22 – 2023-07-23 (×3): 15 mg via ORAL
  Filled 2023-07-19 (×3): qty 3

## 2023-07-19 MED ORDER — PANTOPRAZOLE SODIUM 40 MG PO TBEC
40.0000 mg | DELAYED_RELEASE_TABLET | Freq: Two times a day (BID) | ORAL | Status: DC
  Administered 2023-07-19 – 2023-07-23 (×7): 40 mg via ORAL
  Filled 2023-07-19 (×7): qty 1

## 2023-07-19 MED ORDER — DIPHENHYDRAMINE HCL 25 MG PO CAPS: 25.0000 mg | ORAL_CAPSULE | ORAL | Status: DC | PRN

## 2023-07-19 MED ORDER — NALOXONE HCL 0.4 MG/ML IJ SOLN: 0.4000 mg | INTRAMUSCULAR | Status: DC | PRN

## 2023-07-19 MED ORDER — HYDROMORPHONE 1 MG/ML IV SOLN
INTRAVENOUS | Status: DC
  Administered 2023-07-19: 30 mg via INTRAVENOUS
  Administered 2023-07-20: 0.6 mg via INTRAVENOUS
  Filled 2023-07-19: qty 30

## 2023-07-19 MED ORDER — SENNOSIDES-DOCUSATE SODIUM 8.6-50 MG PO TABS
2.0000 | ORAL_TABLET | Freq: Every day | ORAL | Status: DC
  Administered 2023-07-19 – 2023-07-21 (×3): 2 via ORAL
  Filled 2023-07-19 (×3): qty 2

## 2023-07-19 MED ORDER — SODIUM CHLORIDE 0.9% FLUSH: 9.0000 mL | INTRAVENOUS | Status: DC | PRN

## 2023-07-19 MED ORDER — NEPRO/CARBSTEADY PO LIQD
237.0000 mL | Freq: Two times a day (BID) | ORAL | Status: DC
  Administered 2023-07-19 – 2023-07-23 (×6): 237 mL via ORAL

## 2023-07-19 MED ORDER — ALBUMIN HUMAN 25 % IV SOLN
50.0000 g | INTRAVENOUS | Status: AC
  Administered 2023-07-20: 50 g via INTRAVENOUS
  Filled 2023-07-19: qty 200

## 2023-07-19 MED ORDER — RENA-VITE PO TABS
1.0000 | ORAL_TABLET | Freq: Every day | ORAL | Status: DC
Start: 2023-07-19 — End: 2023-07-23
  Administered 2023-07-19 – 2023-07-21 (×3): 1 via ORAL
  Filled 2023-07-19 (×3): qty 1

## 2023-07-19 MED ORDER — POLYETHYLENE GLYCOL 3350 17 G PO PACK
17.0000 g | PACK | Freq: Every day | ORAL | Status: DC
  Filled 2023-07-19: qty 1

## 2023-07-19 MED ORDER — MIDODRINE HCL 5 MG PO TABS
10.0000 mg | ORAL_TABLET | Freq: Three times a day (TID) | ORAL | Status: DC
  Administered 2023-07-19 – 2023-07-23 (×12): 10 mg via ORAL
  Filled 2023-07-19 (×12): qty 2

## 2023-07-19 NOTE — Progress Notes (Signed)
Sickle cell pain crisis History of sickle cell disease - Patient is complaining about shoulder pain, back pain, generalized abdominal pain which is not improving with as needed oxycodone. -Pharmacy verified that per PDMP report patient was last refilled long-acting OxyContin on 05/12/2023. -Given I am planning to start Dilaudid PCA holding long-acting oral OxyContin and plan to continue Norco for breakthrough pain. -Continue folic acid, hydroxyurea. -Continue to check ETCO2 for the duration of the PCA pump. -Continue to check pulse ox and continue supplemental oxygen keep O2 sat above 94%.

## 2023-07-19 NOTE — Progress Notes (Signed)
PROGRESS NOTE        PATIENT DETAILS Name: Patrick [name] Age: 68 y.o. Sex: male Date of Birth: 1955/07/17 Admit Date: 07/18/2023 Admitting Physician [name], MD [id number], [name], MD  Brief Summary: Patient is a 68 y.o.  male with history of sickle cell disease, ESRD on HD, chronic HFrEF, atrial fibrillation, COPD-who ran out of his usual narcotics approximately 1 week prior to this hospitalization-then developed severe abdominal pain/back pain/leg pain-and missed HD x 2-he subsequently was brought to the ED by his family-where he was found to have volume overload, hyperkalemia was evaluated by-nephrology-underwent emergent HD-and then was subsequently admitted to the hospitalist service.  See below for further details.  Significant events: 1/30>> to ED with missed dialysis x 2-volume overload/shortness of breath-hyperkalemia-Hb 5.6-emergent HD-transfused 1 unit of PRBC.  Significant studies: 12/16>> TTE: EF 40-45%, severe mitral valve regurgitation 1/30>> CXR: No obvious acute abnormalities-possible rounded density in the left mediastinum. 1/30>> RUQ ultrasound: S/p cholecystectomy-no focal abnormality.  Significant microbiology data: 1/30>> COVID/influenza/RSV PCR: Negative  Procedures: None  Consults: Nephrology  Subjective: Had worsening abdominal pain/back pain/leg pain-claims usual sickle cell crisis does not give him this much of pain.  He however feels better since being started on Dilaudid PCA.  Objective: Vitals: Blood pressure (!) 125/94, pulse (!) 107, temperature 98 F (36.7 C), temperature source Oral, resp. rate 13, height 5\' 11"  (1.803 m), weight 69.1 kg, SpO2 95%.   Exam: Gen Exam:Alert awake-not in any distress HEENT:atraumatic, normocephalic Chest: B/L clear to auscultation anteriorly CVS:S1S2 regular Abdomen: Soft-mild tenderness in the epigastric area-no obvious tenderness in other areas-no rebound.   Middle scar present. Extremities:no edema Neurology: Non focal Skin: no rash  Pertinent Labs/Radiology:    Latest Ref Rng & Units 07/19/2023    5:15 AM 07/18/2023    8:44 PM 07/18/2023   11:48 AM  CBC  WBC 4.0 - 10.5 K/uL 9.1  9.8    Hemoglobin 13.0 - 17.0 g/dL 6.4  6.4  6.5   Hematocrit 39.0 - 52.0 % 17.2  17.3  19.0   Platelets 150 - 400 K/uL 124  107      Lab Results  Component Value Date   NA 132 (L) 07/19/2023   K 3.9 07/19/2023   CL 90 (L) 07/19/2023   CO2 26 07/19/2023     Assessment/Plan: Volume overload/acute on chronic HFrEF secondary to missed HD Hyperkalemia secondary to missed HD Volume status/electrolytes all stable after HD on 1/30  Sickle cell crisis Could have been provoked by missed HD/uremia Continues to have mostly epigastric pain but abdominal exam is benign Change PPI to twice daily dosing Remains on Hydrea Continue Dilaudid PCA for today-as he just got comfortable earlier this morning. Resume short acting oxycodone If pain controlled-will attempt to transition him to long-acting OxyContin tomorrow Hb 6.4 after 1 unit of PRBC transfusion on 1/30.  Recent baseline hemoglobin appears to be between 6-7-will transfuse if Hb is less than 6. If pain worsens-will then proceed with CT imaging of abdomen.  ESRD on HD TTS Nephrology following and directing care  Multifactorial anemia Secondary to sickle cell disease/anemia of renal failure  S/p 1 unit of PRBC on 1/30 Defer ESA to the nephrology service. Follow CBC  Chronic hypotension BP stable on midodrine  Chronic atrial fibrillation Rate controlled without the use of any rate control medications  Reviewed prior notes-not a candidate for anticoagulation due to history of GI bleed-AVMs of the stomach/duodenum  History of cirrhosis Relatively well compensated Volume removal with HD  BMI: Estimated body mass index is 21.25 kg/m as calculated from the following:   Height as of this encounter: 5'  11" (1.803 m).   Weight as of this encounter: 69.1 kg.   Code status:   Code Status: Full Code   DVT Prophylaxis: heparin injection 5,000 Units Start: 07/18/23 2200   Family Communication: None at bedside   Disposition Plan: Status is: Inpatient Remains inpatient appropriate because: Severity of illness   Planned Discharge Destination:Home   Diet: Diet Order             Diet renal with fluid restriction Fluid restriction: 1200 mL Fluid; Room service appropriate? Yes; Fluid consistency: Thin  Diet effective now                     Antimicrobial agents: Anti-infectives (From admission, onward)    None        MEDICATIONS: Scheduled Meds:  sodium chloride   Intravenous Once   Chlorhexidine Gluconate Cloth  6 each Topical Q0600   folic acid  1 mg Oral Daily   gabapentin  100 mg Oral TID   heparin  5,000 Units Subcutaneous Q8H   HYDROmorphone   Intravenous Q4H   hydroxyurea  500 mg Oral Daily   midodrine  10 mg Oral TID WC   pantoprazole  40 mg Oral Daily   sodium chloride flush  3 mL Intravenous Q12H   Continuous Infusions: PRN Meds:.acetaminophen **OR** acetaminophen, diphenhydrAMINE, HYDROcodone-acetaminophen, lidocaine (PF), lidocaine-prilocaine, naloxone **AND** sodium chloride flush, ondansetron (ZOFRAN) IV, pentafluoroprop-tetrafluoroeth, polyethylene glycol   I have personally reviewed following labs and imaging studies  LABORATORY DATA: CBC: Recent Labs  Lab 07/18/23 1142 07/18/23 1148 07/18/23 2044 07/19/23 0515  WBC 11.0*  --  9.8 9.1  NEUTROABS 7.9*  --   --   --   HGB 5.6* 6.5* 6.4* 6.4*  HCT 15.8* 19.0* 17.3* 17.2*  MCV 116.2*  --  106.1* 104.9*  PLT 107*  --  107* 124*    Basic Metabolic Panel: Recent Labs  Lab 07/18/23 1142 07/18/23 1148 07/19/23 0515  NA 138 132* 132*  K >7.5* 7.3* 3.9  CL 91* 102 90*  CO2 17*  --  26  GLUCOSE 82 76 143*  BUN 120* 117* 51*  CREATININE 15.23* 15.60* 7.67*  CALCIUM 9.4  --  9.0     GFR: Estimated Creatinine Clearance: 9.4 mL/min (A) (by C-G formula based on SCr of 7.67 mg/dL (H)).  Liver Function Tests: Recent Labs  Lab 07/18/23 1142 07/19/23 0515  AST 54*  50* 48*  ALT 39  38 38  ALKPHOS 159*  159* 130*  BILITOT 6.1*  6.1* 6.3*  PROT 7.7  7.4 7.1  ALBUMIN 3.7  3.7 3.6   Recent Labs  Lab 07/18/23 1142  LIPASE 32   No results for input(s): "AMMONIA" in the last 168 hours.  Coagulation Profile: Recent Labs  Lab 07/18/23 1420  INR 1.8*    Cardiac Enzymes: No results for input(s): "CKTOTAL", "CKMB", "CKMBINDEX", "TROPONINI" in the last 168 hours.  BNP (last 3 results) No results for input(s): "PROBNP" in the last 8760 hours.  Lipid Profile: No results for input(s): "CHOL", "HDL", "LDLCALC", "TRIG", "CHOLHDL", "LDLDIRECT" in the last 72 hours.  Thyroid Function Tests: No results for input(s): "TSH", "T4TOTAL", "FREET4", "T3FREE", "THYROIDAB" in the  last 72 hours.  Anemia Panel: Recent Labs    07/18/23 1142  RETICCTPCT 9.9*    Urine analysis:    Component Value Date/Time   COLORURINE STRAW (A) 06/02/2018 1900   APPEARANCEUR CLEAR 06/02/2018 1900   APPEARANCEUR Clear 05/31/2017 0902   LABSPEC 1.006 06/02/2018 1900   PHURINE 7.0 06/02/2018 1900   GLUCOSEU NEGATIVE 06/02/2018 1900   HGBUR SMALL (A) 06/02/2018 1900   BILIRUBINUR neg 01/26/2020 1124   BILIRUBINUR Negative 05/31/2017 0902   KETONESUR negative 09/08/2018 1621   KETONESUR NEGATIVE 06/02/2018 1900   PROTEINUR Positive (A) 01/26/2020 1124   PROTEINUR 30 (A) 06/02/2018 1900   UROBILINOGEN 1.0 01/26/2020 1124   UROBILINOGEN 0.2 09/14/2013 1354   NITRITE neg 01/26/2020 1124   NITRITE NEGATIVE 06/02/2018 1900   LEUKOCYTESUR Negative 01/26/2020 1124   LEUKOCYTESUR Negative 05/31/2017 0902    Sepsis Labs: Lactic Acid, Venous    Component Value Date/Time   LATICACIDVEN 1.1 10/20/2022 1935    MICROBIOLOGY: Recent Results (from the past 240 hours)  Resp panel  by RT-PCR (RSV, Flu A&B, Covid) Anterior Nasal Swab     Status: None   Collection Time: 07/18/23 11:29 AM   Specimen: Anterior Nasal Swab  Result Value Ref Range Status   SARS Coronavirus 2 by RT PCR NEGATIVE NEGATIVE Final   Influenza A by PCR NEGATIVE NEGATIVE Final   Influenza B by PCR NEGATIVE NEGATIVE Final    Comment: (NOTE) The Xpert Xpress SARS-CoV-2/FLU/RSV plus assay is intended as an aid in the diagnosis of influenza from Nasopharyngeal swab specimens and should not be used as a sole basis for treatment. Nasal washings and aspirates are unacceptable for Xpert Xpress SARS-CoV-2/FLU/RSV testing.  Fact Sheet for Patients: BloggerCourse.com  Fact Sheet for Healthcare Providers: SeriousBroker.it  This test is not yet approved or cleared by the [place] FDA and has been authorized for detection and/or diagnosis of SARS-CoV-2 by FDA under an Emergency Use Authorization (EUA). This EUA will remain in effect (meaning this test can be used) for the duration of the COVID-19 declaration under Section 564(b)(1) of the Act, 21 U.S.C. section 360bbb-3(b)(1), unless the authorization is terminated or revoked.     Resp Syncytial Virus by PCR NEGATIVE NEGATIVE Final    Comment: (NOTE) Fact Sheet for Patients: BloggerCourse.com  Fact Sheet for Healthcare Providers: SeriousBroker.it  This test is not yet approved or cleared by the [place] FDA and has been authorized for detection and/or diagnosis of SARS-CoV-2 by FDA under an Emergency Use Authorization (EUA). This EUA will remain in effect (meaning this test can be used) for the duration of the COVID-19 declaration under Section 564(b)(1) of the Act, 21 U.S.C. section 360bbb-3(b)(1), unless the authorization is terminated or revoked.  Performed at [facility], [address] [place], [place] [address]      RADIOLOGY STUDIES/RESULTS: US Abdomen Limited RUQ (LIVER/GB) Result Date: 07/18/2023 CLINICAL DATA:  Upper quadrant pain EXAM: ULTRASOUND ABDOMEN LIMITED RIGHT UPPER QUADRANT COMPARISON:  None Available. FINDINGS: Gallbladder: Surgically removed Common bile duct: Diameter: 3 mm. Liver: No focal lesion identified. Within normal limits in parenchymal echogenicity. Portal vein is patent on color Doppler imaging with normal direction of blood flow towards the liver. Other: None. IMPRESSION: Status post cholecystectomy.  No other focal abnormality is noted. Electronically Signed   By: Alcide Clever M.D.   On: 07/18/2023 22:03   DG Chest Portable 1 View Result Date: 07/18/2023 CLINICAL DATA:  Abdominal pain, weakness EXAM: PORTABLE CHEST 1 VIEW  COMPARISON:  06/13/2023 FINDINGS: Cardiomegaly, aortic atherosclerosis. Rounded density superior to the aortic arch in the left mediastinum. Based on prior CT, I favor this reflects the left innominate vein, but difficult to completely exclude aneurysm. Mild vascular congestion and interstitial prominence which could reflect interstitial edema. No effusions or acute bony abnormality. IMPRESSION: Cardiomegaly, vascular congestion. Question mild interstitial edema. Rounded density in the left mediastinum superior to the aortic arch. Comparing to prior chest CT, I favor this reflects tortuous left innominate vein, but difficult to completely exclude arch or great vessel aneurysm. This could be further evaluated chest CT with IV contrast. Electronically Signed   By: Charlett Nose M.D.   On: 07/18/2023 11:47     LOS: 1 day   Jeoffrey Massed, MD  [facility]    To contact the attending provider between 7A-7P or the covering provider during after hours 7P-7A, please log into the web site [email or web address] and access using universal [place] password for that web site. If you do not have the password, please call the hospital operator.  07/19/2023, 9:52 AM

## 2023-07-19 NOTE — Plan of Care (Signed)
  Problem: Education: Goal: Knowledge of General Education information will improve Description: Including pain rating scale, medication(s)/side effects and non-pharmacologic comfort measures Outcome: Progressing   Problem: Health Behavior/Discharge Planning: Goal: Ability to manage health-related needs will improve Outcome: Progressing   Problem: Clinical Measurements: Goal: Ability to maintain clinical measurements within normal limits will improve Outcome: Progressing   Problem: Clinical Measurements: Goal: Diagnostic test results will improve Outcome: Progressing   Problem: Clinical Measurements: Goal: Respiratory complications will improve Outcome: Progressing   Problem: Education: Goal: Knowledge of vaso-occlusive preventative measures will improve Outcome: Progressing   Problem: Self-Care: Goal: Ability to incorporate actions that prevent/reduce pain crisis will improve Outcome: Progressing

## 2023-07-19 NOTE — Plan of Care (Signed)
Pt has rested quietly throughout the night with no distress noted. Alert and oriented. On O2 4LNC. A-fib on the monitor. Pt had BM and voided in BR. Using walker in room. PCA set up for pt pain but after the 0.5mg  load dose pt slept but was easily aroused and fall right back to sleep. NO other complaints voiced.     Problem: Pain Managment: Goal: General experience of comfort will improve and/or be controlled Outcome: Progressing   Problem: Tissue Perfusion: Goal: Complications related to inadequate tissue perfusion will be avoided or minimized Outcome: Progressing   Problem: Respiratory: Goal: Pulmonary complications will be avoided or minimized Outcome: Progressing   Problem: Fluid Volume: Goal: Ability to maintain a balanced intake and output will improve Outcome: Progressing   Problem: Sensory: Goal: Pain level will decrease with appropriate interventions Outcome: Progressing

## 2023-07-19 NOTE — Progress Notes (Signed)
[facility] Progress Note   Subjective:     Objective Vitals:   07/19/23 0400 07/19/23 0500 07/19/23 0800 07/19/23 1007  BP:   (!) 125/94   Pulse: 100  (!) 107   Resp: 16  13 18   Temp: 98 F (36.7 C)  98 F (36.7 C)   TempSrc:   Oral   SpO2:   95%   Weight:  69.1 kg    Height:       Physical Exam General: Heart: Lungs: Abdomen: Extremities: Dialysis Access:   Dialysis Orders:  Additional Objective Labs: Basic Metabolic Panel: Recent Labs  Lab 07/18/23 1142 07/18/23 1148 07/19/23 0515  NA 138 132* 132*  K >7.5* 7.3* 3.9  CL 91* 102 90*  CO2 17*  --  26  GLUCOSE 82 76 143*  BUN 120* 117* 51*  CREATININE 15.23* 15.60* 7.67*  CALCIUM 9.4  --  9.0   Liver Function Tests: Recent Labs  Lab 07/18/23 1142 07/19/23 0515  AST 54*  50* 48*  ALT 39  38 38  ALKPHOS 159*  159* 130*  BILITOT 6.1*  6.1* 6.3*  PROT 7.7  7.4 7.1  ALBUMIN 3.7  3.7 3.6   Recent Labs  Lab 07/18/23 1142  LIPASE 32   CBC: Recent Labs  Lab 07/18/23 1142 07/18/23 1148 07/18/23 2044 07/19/23 0515 07/19/23 0913  WBC 11.0*  --  9.8 9.1  --   NEUTROABS 7.9*  --   --   --   --   HGB 5.6*   < > 6.4* 6.4* 6.9*  HCT 15.8*   < > 17.3* 17.2* 18.3*  MCV 116.2*  --  106.1* 104.9*  --   PLT 107*  --  107* 124*  --    < > = values in this interval not displayed.   Blood Culture    Component Value Date/Time   SDES EXPECTORATED SPUTUM 07/25/2022 0800   SPECREQUEST NONE 07/25/2022 0800   CULT  07/24/2022 0053    NO GROWTH 5 DAYS Performed at [facility], [address] [place], [place] [address]    REPTSTATUS 07/25/2022 FINAL 07/25/2022 0800    Cardiac Enzymes: No results for input(s): "CKTOTAL", "CKMB", "CKMBINDEX", "TROPONINI" in the last 168 hours. CBG: No results for input(s): "GLUCAP" in the last 168 hours. Iron Studies: No results for input(s): "IRON", "TIBC", "TRANSFERRIN", "FERRITIN" in the last 72 hours. @lablastinr3 @ Studies/Results: US  Abdomen Limited RUQ (LIVER/GB) Result Date: 07/18/2023 CLINICAL DATA:  Upper quadrant pain EXAM: ULTRASOUND ABDOMEN LIMITED RIGHT UPPER QUADRANT COMPARISON:  None Available. FINDINGS: Gallbladder: Surgically removed Common bile duct: Diameter: 3 mm. Liver: No focal lesion identified. Within normal limits in parenchymal echogenicity. Portal vein is patent on color Doppler imaging with normal direction of blood flow towards the liver. Other: None. IMPRESSION: Status post cholecystectomy.  No other focal abnormality is noted. Electronically Signed   By: Alcide Clever M.D.   On: 07/18/2023 22:03   DG Chest Portable 1 View Result Date: 07/18/2023 CLINICAL DATA:  Abdominal pain, weakness EXAM: PORTABLE CHEST 1 VIEW COMPARISON:  06/13/2023 FINDINGS: Cardiomegaly, aortic atherosclerosis. Rounded density superior to the aortic arch in the left mediastinum. Based on prior CT, I favor this reflects the left innominate vein, but difficult to completely exclude aneurysm. Mild vascular congestion and interstitial prominence which could reflect interstitial edema. No effusions or acute bony abnormality. IMPRESSION: Cardiomegaly, vascular congestion. Question mild interstitial edema. Rounded density in the left mediastinum superior to the aortic arch.  Comparing to prior chest CT, I favor this reflects tortuous left innominate vein, but difficult to completely exclude arch or great vessel aneurysm. This could be further evaluated chest CT with IV contrast. Electronically Signed   By: Charlett Nose M.D.   On: 07/18/2023 11:47   Medications:   sodium chloride   Intravenous Once   Chlorhexidine Gluconate Cloth  6 each Topical Q0600   folic acid  1 mg Oral Daily   gabapentin  100 mg Oral TID   heparin  5,000 Units Subcutaneous Q8H   HYDROmorphone   Intravenous Q4H   hydroxyurea  500 mg Oral Daily   midodrine  10 mg Oral TID WC   pantoprazole  40 mg Oral BID   polyethylene glycol  17 g Oral Daily   senna-docusate  2  tablet Oral QHS   sodium chloride flush  3 mL Intravenous Q12H   HD orders: [facility] T,Th,S 4:15 hr 180 NRe 65.3 kg 2.0 K/2.0 Ca 400/500 UFP 2 AVF  - No Heparin - Mircera 225 MCGIV q 2 weeks - Hectorol 9 mcg IV three times per week - Sensipar 30 mg PO TIW   Assessment/Plan: Severe hyperkalemia D/T missed HD-emergent HD 07/18/2023. K+ 3.9 today. Follow labs.  Sickle Cell Crisis-management per primary.  Volume overload is setting of missed HD. CXR with Cardiomegaly, vascular congestion.  Net UF 3.4 liters with HD 07/18/2023 Still above OP EDW. Continue to optimize volume with HD Afib-rate 110. No anticoagulation D/T GIB. Per primary ESRD -T, Th,S-next HD 07/20/2023 Anemia - Sickle Cell pt. HGB 5.6 on admission. S/P 1 units of PRBCs now with HGB 6.9. Will transfuse tomorrow on HD.  Secondary hyperparathyroidism - Continue OP meds. Corrected Ca+ OK, Check PO4.   HTN/volume - BP Soft. Above OP EDW but no volume excess by exam. Continue midodrine.  Nutrition - Albumin acceptable. Nepro renal vitamins  Jessicah Croll H. Krisalyn Yankowski NP-C 07/19/2023, 11:44 AM  [employer] [phone]

## 2023-07-19 NOTE — Progress Notes (Addendum)
Patient's RN reported that patient's blood pressure is soft MAP 65 and patient is asymptomatic.  Per chart review patient has history of chronic hypotension due to low ejection fraction and on chronically on midodrine 10 mg 3 times daily.  Patient is also ESRD on dialysis TTS schedule which is limiting further IV resuscitation.  Patient is on Dilaudid drip for sickle cell pain crisis which is decreasing blood pressure as well.  Giving albumin 50 g.   Tereasa Coop, MD [facility] 07/19/2023, 11:30 PM

## 2023-07-19 NOTE — Progress Notes (Signed)
 Heart Failure Navigator Progress Note  Assessed for Heart & Vascular TOC clinic readiness.  Patient does not meet criteria due to ESRD on hemodialysis.   Navigator will sign off at this time.   Rhae Hammock, BSN, Scientist, clinical (histocompatibility and immunogenetics) Only

## 2023-07-20 DIAGNOSIS — N186 End stage renal disease: Secondary | ICD-10-CM | POA: Diagnosis not present

## 2023-07-20 DIAGNOSIS — I4821 Permanent atrial fibrillation: Secondary | ICD-10-CM | POA: Diagnosis not present

## 2023-07-20 DIAGNOSIS — K7460 Unspecified cirrhosis of liver: Secondary | ICD-10-CM | POA: Diagnosis not present

## 2023-07-20 DIAGNOSIS — D5700 Hb-SS disease with crisis, unspecified: Secondary | ICD-10-CM | POA: Diagnosis not present

## 2023-07-20 LAB — CBC
HCT: 17.4 % — ABNORMAL LOW (ref 39.0–52.0)
Hemoglobin: 6.5 g/dL — CL (ref 13.0–17.0)
MCH: 39.9 pg — ABNORMAL HIGH (ref 26.0–34.0)
MCHC: 37.4 g/dL — ABNORMAL HIGH (ref 30.0–36.0)
MCV: 106.7 fL — ABNORMAL HIGH (ref 80.0–100.0)
Platelets: 105 10*3/uL — ABNORMAL LOW (ref 150–400)
RBC: 1.63 MIL/uL — ABNORMAL LOW (ref 4.22–5.81)
RDW: 21.3 % — ABNORMAL HIGH (ref 11.5–15.5)
WBC: 7.6 10*3/uL (ref 4.0–10.5)
nRBC: 57.4 % — ABNORMAL HIGH (ref 0.0–0.2)

## 2023-07-20 LAB — RENAL FUNCTION PANEL
Albumin: 3.4 g/dL — ABNORMAL LOW (ref 3.5–5.0)
Anion gap: 16 — ABNORMAL HIGH (ref 5–15)
BUN: 59 mg/dL — ABNORMAL HIGH (ref 8–23)
CO2: 26 mmol/L (ref 22–32)
Calcium: 9.2 mg/dL (ref 8.9–10.3)
Chloride: 93 mmol/L — ABNORMAL LOW (ref 98–111)
Creatinine, Ser: 9.29 mg/dL — ABNORMAL HIGH (ref 0.61–1.24)
GFR, Estimated: 6 mL/min — ABNORMAL LOW (ref >60)
Glucose, Bld: 123 mg/dL — ABNORMAL HIGH (ref 70–99)
Phosphorus: 7.5 mg/dL — ABNORMAL HIGH (ref 2.5–4.6)
Potassium: 4.3 mmol/L (ref 3.5–5.1)
Sodium: 135 mmol/L (ref 135–145)

## 2023-07-20 LAB — PREPARE RBC (CROSSMATCH)

## 2023-07-20 LAB — HEMOGLOBIN AND HEMATOCRIT, BLOOD
HCT: 20 % — ABNORMAL LOW (ref 39.0–52.0)
Hemoglobin: 7.3 g/dL — ABNORMAL LOW (ref 13.0–17.0)

## 2023-07-20 LAB — HEPATITIS B SURFACE ANTIBODY, QUANTITATIVE: Hep B S AB Quant (Post): 503 m[IU]/mL

## 2023-07-20 MED ORDER — POLYETHYLENE GLYCOL 3350 17 G PO PACK
17.0000 g | PACK | Freq: Two times a day (BID) | ORAL | Status: DC
  Administered 2023-07-21: 17 g via ORAL
  Filled 2023-07-20 (×4): qty 1

## 2023-07-20 MED ORDER — HYDROMORPHONE 1 MG/ML IV SOLN
INTRAVENOUS | Status: DC
  Administered 2023-07-21: 0.9 mg via INTRAVENOUS
  Filled 2023-07-20 (×5): qty 30

## 2023-07-20 MED ORDER — SODIUM CHLORIDE 0.9% IV SOLUTION: Freq: Once | INTRAVENOUS | Status: DC

## 2023-07-20 MED ORDER — BISACODYL 10 MG RE SUPP: 10.0000 mg | Freq: Every day | RECTAL | Status: DC | PRN

## 2023-07-20 NOTE — Progress Notes (Signed)
PROGRESS NOTE        PATIENT DETAILS Name: Patrick [name] Age: 68 y.o. Sex: male Date of Birth: [birth year] Admit Date: 07/18/2023 Admitting Physician [name], MD NWG:[id number], [name], MD  Brief Summary: Patient is a 68 y.o.  male with history of sickle cell disease, ESRD on HD, chronic HFrEF, atrial fibrillation, COPD-who ran out of his usual narcotics approximately 1 week prior to this hospitalization-then developed severe abdominal pain/back pain/leg pain-and missed HD x 2-he subsequently was brought to the ED by his family-where he was found to have volume overload, hyperkalemia was evaluated by-nephrology-underwent emergent HD-and then was subsequently admitted to the hospitalist service.  See below for further details.  Significant events: 1/30>> to ED with missed dialysis x 2-volume overload/shortness of breath-hyperkalemia-Hb 5.6-emergent HD-transfused 1 unit of PRBC.  Significant studies: 12/16>> TTE: EF 40-45%, severe mitral valve regurgitation 1/30>> CXR: No obvious acute abnormalities-possible rounded density in the left mediastinum. 1/30>> RUQ ultrasound: S/p cholecystectomy-no focal abnormality.  Significant microbiology data: 1/30>> COVID/influenza/RSV PCR: Negative  Procedures: None  Consults: Nephrology  Subjective: Awake/alert-continues to complain of back/shoulder/abdominal pain.  Not yet to the extent where he can come off the Dilaudid PCA.  After I saw him this morning-he conveyed to nephrology team that he was contemplating thinking stopping hemodialysis.  Objective: Vitals: Blood pressure (!) 88/57, pulse 89, temperature (!) 97.4 F (36.3 C), resp. rate (!) 8, height 5\' 11"  (1.803 m), weight 67.1 kg, SpO2 99%.   Exam: Gen Exam:Alert awake-not in any distress HEENT:atraumatic, normocephalic Chest: B/L clear to auscultation anteriorly CVS:S1S2 regular Abdomen:soft non tender, non distended Extremities:no  edema Neurology: Non focal Skin: no rash  Pertinent Labs/Radiology:    Latest Ref Rng & Units 07/20/2023    5:20 AM 07/19/2023    9:13 AM 07/19/2023    5:15 AM  CBC  WBC 4.0 - 10.5 K/uL 7.6   9.1   Hemoglobin 13.0 - 17.0 g/dL 6.5  6.9  6.4   Hematocrit 39.0 - 52.0 % 17.4  18.3  17.2   Platelets 150 - 400 K/uL 105   124     Lab Results  Component Value Date   NA 135 07/20/2023   K 4.3 07/20/2023   CL 93 (L) 07/20/2023   CO2 26 07/20/2023     Assessment/Plan: Volume overload/acute on chronic HFrEF secondary to missed HD Hyperkalemia secondary to missed HD Volume status/electrolytes all stable after HD on 1/30  Sickle cell crisis Could have been provoked by missed HD/uremia Continues to be in persistent abdominal/shoulder/back pain/leg pain  Thankfully abdominal exam remains benign Last BM was yesterday per patient continue Dilaudid PCA-BP soft but relatively stable this morning Continue short acting oxycodone Nephrology team planning 1 more unit of PRBC transfusion with hemodialysis today If no improvement-will need CT imaging  ESRD on HD TTS Nephrology following and directing care On 2/1-patient conveyed to the nephrology team that he is contemplating stopping hemodialysis-will continue to engage with him over the next several days but will go ahead and place a consult for palliative care to begin goals of care discussion.  Not clear whether this was sudden frustration due to his acute medical issues.  Multifactorial anemia Secondary to sickle cell disease/anemia of renal failure  S/p 1 unit of PRBC on 1/30 and on 2/1 with hemodialysis Defer ESA to the nephrology service. Follow  CBC  Chronic hypotension BP stable on midodrine  Chronic atrial fibrillation Rate controlled without the use of any rate control medications Reviewed prior notes-not a candidate for anticoagulation due to history of GI bleed-AVMs of the stomach/duodenum  History of cirrhosis Relatively well  compensated Volume removal with HD  BMI: Estimated body mass index is 20.63 kg/m as calculated from the following:   Height as of this encounter: 5\' 11"  (1.803 m).   Weight as of this encounter: 67.1 kg.   Code status:   Code Status: Full Code   DVT Prophylaxis: heparin injection 5,000 Units Start: 07/18/23 2200   Family Communication: None at bedside   Disposition Plan: Status is: Inpatient Remains inpatient appropriate because: Severity of illness   Planned Discharge Destination:Home   Diet: Diet Order             Diet renal with fluid restriction Fluid restriction: 1200 mL Fluid; Room service appropriate? Yes; Fluid consistency: Thin  Diet effective now                     Antimicrobial agents: Anti-infectives (From admission, onward)    None        MEDICATIONS: Scheduled Meds:  sodium chloride   Intravenous Once   sodium chloride   Intravenous Once   Chlorhexidine Gluconate Cloth  6 each Topical Q0600   feeding supplement (NEPRO CARB STEADY)  237 mL Oral BID BM   gabapentin  100 mg Oral TID   heparin  5,000 Units Subcutaneous Q8H   HYDROmorphone   Intravenous Q4H   hydroxyurea  500 mg Oral Daily   midodrine  10 mg Oral TID WC   multivitamin  1 tablet Oral QHS   pantoprazole  40 mg Oral BID   polyethylene glycol  17 g Oral Daily   senna-docusate  2 tablet Oral QHS   sodium chloride flush  3 mL Intravenous Q12H   Continuous Infusions: PRN Meds:.acetaminophen **OR** acetaminophen, diphenhydrAMINE, lidocaine (PF), lidocaine-prilocaine, naloxone **AND** sodium chloride flush, ondansetron (ZOFRAN) IV, oxyCODONE, pentafluoroprop-tetrafluoroeth   I have personally reviewed following labs and imaging studies  LABORATORY DATA: CBC: Recent Labs  Lab 07/18/23 1142 07/18/23 1148 07/18/23 2044 07/19/23 0515 07/19/23 0913 07/20/23 0520  WBC 11.0*  --  9.8 9.1  --  7.6  NEUTROABS 7.9*  --   --   --   --   --   HGB 5.6* 6.5* 6.4* 6.4* 6.9* 6.5*   HCT 15.8* 19.0* 17.3* 17.2* 18.3* 17.4*  MCV 116.2*  --  106.1* 104.9*  --  106.7*  PLT 107*  --  107* 124*  --  105*    Basic Metabolic Panel: Recent Labs  Lab 07/18/23 1142 07/18/23 1148 07/19/23 0515 07/20/23 0520  NA 138 132* 132* 135  K >7.5* 7.3* 3.9 4.3  CL 91* 102 90* 93*  CO2 17*  --  26 26  GLUCOSE 82 76 143* 123*  BUN 120* 117* 51* 59*  CREATININE 15.23* 15.60* 7.67* 9.29*  CALCIUM 9.4  --  9.0 9.2  PHOS  --   --   --  7.5*    GFR: Estimated Creatinine Clearance: 7.5 mL/min (A) (by C-G formula based on SCr of 9.29 mg/dL (H)).  Liver Function Tests: Recent Labs  Lab 07/18/23 1142 07/19/23 0515 07/20/23 0520  AST 54*  50* 48*  --   ALT 39  38 38  --   ALKPHOS 159*  159* 130*  --   BILITOT 6.1*  6.1* 6.3*  --   PROT 7.7  7.4 7.1  --   ALBUMIN 3.7  3.7 3.6 3.4*   Recent Labs  Lab 07/18/23 1142  LIPASE 32   No results for input(s): "AMMONIA" in the last 168 hours.  Coagulation Profile: Recent Labs  Lab 07/18/23 1420  INR 1.8*    Cardiac Enzymes: No results for input(s): "CKTOTAL", "CKMB", "CKMBINDEX", "TROPONINI" in the last 168 hours.  BNP (last 3 results) No results for input(s): "PROBNP" in the last 8760 hours.  Lipid Profile: No results for input(s): "CHOL", "HDL", "LDLCALC", "TRIG", "CHOLHDL", "LDLDIRECT" in the last 72 hours.  Thyroid Function Tests: No results for input(s): "TSH", "T4TOTAL", "FREET4", "T3FREE", "THYROIDAB" in the last 72 hours.  Anemia Panel: Recent Labs    07/18/23 1142  RETICCTPCT 9.9*    Urine analysis:    Component Value Date/Time   COLORURINE STRAW (A) 06/02/2018 1900   APPEARANCEUR CLEAR 06/02/2018 1900   APPEARANCEUR Clear 05/31/2017 0902   LABSPEC 1.006 06/02/2018 1900   PHURINE 7.0 06/02/2018 1900   GLUCOSEU NEGATIVE 06/02/2018 1900   HGBUR SMALL (A) 06/02/2018 1900   BILIRUBINUR neg 01/26/2020 1124   BILIRUBINUR Negative 05/31/2017 0902   KETONESUR negative 09/08/2018 1621   KETONESUR  NEGATIVE 06/02/2018 1900   PROTEINUR Positive (A) 01/26/2020 1124   PROTEINUR 30 (A) 06/02/2018 1900   UROBILINOGEN 1.0 01/26/2020 1124   UROBILINOGEN 0.2 09/14/2013 1354   NITRITE neg 01/26/2020 1124   NITRITE NEGATIVE 06/02/2018 1900   LEUKOCYTESUR Negative 01/26/2020 1124   LEUKOCYTESUR Negative 05/31/2017 0902    Sepsis Labs: Lactic Acid, Venous    Component Value Date/Time   LATICACIDVEN 1.1 10/20/2022 1935    MICROBIOLOGY: Recent Results (from the past 240 hours)  Resp panel by RT-PCR (RSV, Flu A&B, Covid) Anterior Nasal Swab     Status: None   Collection Time: 07/18/23 11:29 AM   Specimen: Anterior Nasal Swab  Result Value Ref Range Status   SARS Coronavirus 2 by RT PCR NEGATIVE NEGATIVE Final   Influenza A by PCR NEGATIVE NEGATIVE Final   Influenza B by PCR NEGATIVE NEGATIVE Final    Comment: (NOTE) The Xpert Xpress SARS-CoV-2/FLU/RSV plus assay is intended as an aid in the diagnosis of influenza from Nasopharyngeal swab specimens and should not be used as a sole basis for treatment. Nasal washings and aspirates are unacceptable for Xpert Xpress SARS-CoV-2/FLU/RSV testing.  Fact Sheet for Patients: BloggerCourse.com  Fact Sheet for Healthcare Providers: SeriousBroker.it  This test is not yet approved or cleared by the [place] FDA and has been authorized for detection and/or diagnosis of SARS-CoV-2 by FDA under an Emergency Use Authorization (EUA). This EUA will remain in effect (meaning this test can be used) for the duration of the COVID-19 declaration under Section 564(b)(1) of the Act, 21 U.S.C. section 360bbb-3(b)(1), unless the authorization is terminated or revoked.     Resp Syncytial Virus by PCR NEGATIVE NEGATIVE Final    Comment: (NOTE) Fact Sheet for Patients: BloggerCourse.com  Fact Sheet for Healthcare Providers: SeriousBroker.it  This  test is not yet approved or cleared by the [place] FDA and has been authorized for detection and/or diagnosis of SARS-CoV-2 by FDA under an Emergency Use Authorization (EUA). This EUA will remain in effect (meaning this test can be used) for the duration of the COVID-19 declaration under Section 564(b)(1) of the Act, 21 U.S.C. section 360bbb-3(b)(1), unless the authorization is terminated or revoked.  Performed at [facility], [address] [place], [place] [address]     RADIOLOGY STUDIES/RESULTS: US Abdomen Limited RUQ (LIVER/GB) Result Date: 07/18/2023 CLINICAL DATA:  Upper quadrant pain EXAM: ULTRASOUND ABDOMEN LIMITED RIGHT UPPER QUADRANT COMPARISON:  None Available. FINDINGS: Gallbladder: Surgically removed Common bile duct: Diameter: 3 mm. Liver: No focal lesion identified. Within normal limits in parenchymal echogenicity. Portal vein is patent on color Doppler imaging with normal direction of blood flow towards the liver. Other: None. IMPRESSION: Status post cholecystectomy.  No other focal abnormality is noted. Electronically Signed   By: Alcide Clever M.D.   On: 07/18/2023 22:03   DG Chest Portable 1 View Result Date: 07/18/2023 CLINICAL DATA:  Abdominal pain, weakness EXAM: PORTABLE CHEST 1 VIEW COMPARISON:  06/13/2023 FINDINGS: Cardiomegaly, aortic atherosclerosis. Rounded density superior to the aortic arch in the left mediastinum. Based on prior CT, I favor this reflects the left innominate vein, but difficult to completely exclude aneurysm. Mild vascular congestion and interstitial prominence which could reflect interstitial edema. No effusions or acute bony abnormality. IMPRESSION: Cardiomegaly, vascular congestion. Question mild interstitial edema. Rounded density in the left mediastinum superior to the aortic arch. Comparing to prior chest CT, I favor this reflects tortuous left innominate vein, but difficult to completely exclude arch or great vessel aneurysm. This  could be further evaluated chest CT with IV contrast. Electronically Signed   By: Charlett Nose M.D.   On: 07/18/2023 11:47     LOS: 2 days   Jeoffrey Massed, MD  [facility]    To contact the attending provider between 7A-7P or the covering provider during after hours 7P-7A, please log into the web site [email or web address] and access using universal [place] password for that web site. If you do not have the password, please call the hospital operator.  07/20/2023, 10:25 AM

## 2023-07-20 NOTE — Progress Notes (Addendum)
Acute on chronic anemia - Hemoglobin dropped again to 6.5.  Planning to transfuse 1 unit of blood as patient is going for dialysis today.   Patient has baseline hemoglobin around 7.0 to 8.6 and history of sickle cell disease as well.  Currently also treating for sickle cell pain crisis.  Patient received 1 unit of blood transfusion yesterday, as hemoglobin gradually dropping transfusing 1 unit of blood however will not do of her transfusion as it will do further sickling and can worsen the sickle cell pain crisis.

## 2023-07-20 NOTE — Progress Notes (Signed)
Patient RN reported that he still patient is complaining about generalized pain secondary to sickle cell pain crisis.  The Dilaudid PCA supposed to be expired at 10 PM.  Given patient has still persisting pain I have planning to continue the PCA pump  for next 24 hours and will reevaluate patient again.  Tereasa Coop, MD [facility] 07/20/2023, 7:59 PM

## 2023-07-20 NOTE — Progress Notes (Signed)
[facility] Progress Note   Subjective: Seen prior to starting HD. He tell me, "[place] I just can't do this anymore. Everytime I turn around it's something else I have to do and I'm just tired." He understands fully that without dialysis that he will die. He also fully understands that dialysis is an optional treatment and that he can stop at any time. Attending notified. Patient agrees to proceed with today's treatment and then we will consult Palliative Care for EOL discussions.  RN Caseyat bedside witness to conversation.      Objective Vitals:   07/20/23 0016 07/20/23 0312 07/20/23 0441 07/20/23 0457  BP:   (!) 87/72   Pulse:   99   Resp: 14  11 13   Temp:   98.2 F (36.8 C)   TempSrc:   Oral   SpO2:   99%   Weight:  69.1 kg    Height:       Physical Exam General: Chronically ill appearing male.  HEENT: facial edema Heart: S1S2 2/6 systolic M Lungs: CTAB No WOB Abdomen: NABS NT Extremities: trace BLE edema Dialysis Access: AVF aneursymal + T/B   Additional Objective Labs: Basic Metabolic Panel: Recent Labs  Lab 07/18/23 1142 07/18/23 1148 07/19/23 0515 07/20/23 0520  NA 138 132* 132* 135  K >7.5* 7.3* 3.9 4.3  CL 91* 102 90* 93*  CO2 17*  --  26 26  GLUCOSE 82 76 143* 123*  BUN 120* 117* 51* 59*  CREATININE 15.23* 15.60* 7.67* 9.29*  CALCIUM 9.4  --  9.0 9.2  PHOS  --   --   --  7.5*   Liver Function Tests: Recent Labs  Lab 07/18/23 1142 07/19/23 0515 07/20/23 0520  AST 54*  50* 48*  --   ALT 39  38 38  --   ALKPHOS 159*  159* 130*  --   BILITOT 6.1*  6.1* 6.3*  --   PROT 7.7  7.4 7.1  --   ALBUMIN 3.7  3.7 3.6 3.4*   Recent Labs  Lab 07/18/23 1142  LIPASE 32   CBC: Recent Labs  Lab 07/18/23 1142 07/18/23 1148 07/18/23 2044 07/19/23 0515 07/19/23 0913 07/20/23 0520  WBC 11.0*  --  9.8 9.1  --  7.6  NEUTROABS 7.9*  --   --   --   --   --   HGB 5.6*   < > 6.4* 6.4* 6.9* 6.5*  HCT 15.8*   < > 17.3* 17.2* 18.3* 17.4*   MCV 116.2*  --  106.1* 104.9*  --  106.7*  PLT 107*  --  107* 124*  --  105*   < > = values in this interval not displayed.   Blood Culture    Component Value Date/Time   SDES EXPECTORATED SPUTUM 07/25/2022 0800   SPECREQUEST NONE 07/25/2022 0800   CULT  07/24/2022 0053    NO GROWTH 5 DAYS Performed at [facility], [address], [place], [place] [address]    REPTSTATUS 07/25/2022 FINAL 07/25/2022 0800    Cardiac Enzymes: No results for input(s): "CKTOTAL", "CKMB", "CKMBINDEX", "TROPONINI" in the last 168 hours. CBG: No results for input(s): "GLUCAP" in the last 168 hours. Iron Studies: No results for input(s): "IRON", "TIBC", "TRANSFERRIN", "FERRITIN" in the last 72 hours. @lablastinr3 @ Studies/Results: US Abdomen Limited RUQ (LIVER/GB) Result Date: 07/18/2023 CLINICAL DATA:  Upper quadrant pain EXAM: ULTRASOUND ABDOMEN LIMITED RIGHT UPPER QUADRANT COMPARISON:  None Available. FINDINGS: Gallbladder: Surgically removed Common bile  duct: Diameter: 3 mm. Liver: No focal lesion identified. Within normal limits in parenchymal echogenicity. Portal vein is patent on color Doppler imaging with normal direction of blood flow towards the liver. Other: None. IMPRESSION: Status post cholecystectomy.  No other focal abnormality is noted. Electronically Signed   By: Alcide Clever M.D.   On: 07/18/2023 22:03   DG Chest Portable 1 View Result Date: 07/18/2023 CLINICAL DATA:  Abdominal pain, weakness EXAM: PORTABLE CHEST 1 VIEW COMPARISON:  06/13/2023 FINDINGS: Cardiomegaly, aortic atherosclerosis. Rounded density superior to the aortic arch in the left mediastinum. Based on prior CT, I favor this reflects the left innominate vein, but difficult to completely exclude aneurysm. Mild vascular congestion and interstitial prominence which could reflect interstitial edema. No effusions or acute bony abnormality. IMPRESSION: Cardiomegaly, vascular congestion. Question mild interstitial edema. Rounded  density in the left mediastinum superior to the aortic arch. Comparing to prior chest CT, I favor this reflects tortuous left innominate vein, but difficult to completely exclude arch or great vessel aneurysm. This could be further evaluated chest CT with IV contrast. Electronically Signed   By: Charlett Nose M.D.   On: 07/18/2023 11:47   Medications:   sodium chloride   Intravenous Once   sodium chloride   Intravenous Once   Chlorhexidine Gluconate Cloth  6 each Topical Q0600   feeding supplement (NEPRO CARB STEADY)  237 mL Oral BID BM   gabapentin  100 mg Oral TID   heparin  5,000 Units Subcutaneous Q8H   HYDROmorphone   Intravenous Q4H   hydroxyurea  500 mg Oral Daily   midodrine  10 mg Oral TID WC   multivitamin  1 tablet Oral QHS   pantoprazole  40 mg Oral BID   polyethylene glycol  17 g Oral Daily   senna-docusate  2 tablet Oral QHS   sodium chloride flush  3 mL Intravenous Q12H     HD orders: [facility] T,Th,S 4:15 hr 180 NRe 65.3 kg 2.0 K/2.0 Ca 400/500 UFP 2 AVF  - No Heparin - Mircera 225 MCGIV q 2 weeks - Hectorol 9 mcg IV three times per week - Sensipar 30 mg PO TIW    Assessment/Plan: Severe hyperkalemia D/T missed HD-emergent HD 07/18/2023. K+ 3.9 today. Follow labs.  Sickle Cell Crisis-management per primary.  Volume overload is setting of missed HD. CXR with Cardiomegaly, vascular congestion.  Net UF 3.4 liters with HD 07/18/2023 Still above OP EDW. Continue to optimize volume with HD Afib-rate 110. No anticoagulation D/T GIB. Per primary ESRD -T, Th,S-next HD 07/20/2023 Anemia - Sickle Cell pt. HGB 5.6 on admission. S/P 1 units of PRBCs now with HGB 6.9. Will transfuse tomorrow on HD.  Secondary hyperparathyroidism - Continue OP meds. Corrected Ca+ OK, Check PO4.   HTN/volume - BP Soft. Above OP EDW but no volume excess by exam. Continue midodrine.  Nutrition - Albumin acceptable. Nepro renal vitamins EOL discussion: Patrick [name] is alert and oriented X 3. He tells me  that he is tired. He tells me that he "doesn't want to do this (dialysis) anymore". He says that every time he turns around it's some thing else he has do, somewhere else he has to go and he doesn't want to do this anymore. He understands that hemodialysis is an optional treatment without which he will die. We will support his wishes and agree to proceed with HD today, then ask Palliative Care to see pt for EOL discussions and most likely, comfort care.   [place]  Isla Pence NP-C 07/20/2023, 8:34 AM  [employer] [phone]

## 2023-07-20 NOTE — Progress Notes (Signed)
Received patient in bed to unit.  Alert and oriented.  Informed consent signed and in chart.   TX duration:3hrs per provider VORB  Patient tolerated well.  Transported back to the room  Alert, without acute distress.  Hand-off given to patient's nurse.   Access used: RAVF Access issues: none  Total UF removed: 0 Medication(s) given: none   07/20/23 1239  Vitals  Temp 98.4 F (36.9 C)  BP (!) 97/57  MAP (mmHg) 70  Pulse Rate 87  ECG Heart Rate 96  Resp (!) 9  Oxygen Therapy  O2 Device Room Air  Patient Activity (if Appropriate) In bed  During Treatment Monitoring  Blood Flow Rate (mL/min) 400 mL/min  Arterial Pressure (mmHg) -147.46 mmHg  Venous Pressure (mmHg) 240.8 mmHg  TMP (mmHg) 8.68 mmHg  Ultrafiltration Rate (mL/min) 68 mL/min  Dialysate Flow Rate (mL/min) 300 ml/min  Duration of HD Treatment -hour(s) 3 hour(s)  Cumulative Fluid Removed (mL) per Treatment  0.02  HD Safety Checks Performed Yes  Intra-Hemodialysis Comments Tx completed  Dialysis Fluid Bolus Normal Saline  Bolus Amount (mL) 300 mL  Post Treatment  Dialyzer Clearance Lightly streaked  Liters Processed 72  Fluid Removed (mL) 0 mL  Tolerated HD Treatment Yes  AVG/AVF Arterial Site Held (minutes) 5 minutes  AVG/AVF Venous Site Held (minutes) 5 minutes  Fistula / Graft Right Upper arm Arteriovenous fistula  Placement Date/Time: 08/01/21 0354   Placed prior to admission: Yes  Orientation: Right  Access Location: Upper arm  Access Type: Arteriovenous fistula  Site Condition No complications  Fistula / Graft Assessment Present;Thrill;Bruit  Needle Size 15      Freddi Starr, RN [facility]

## 2023-07-21 DIAGNOSIS — K7460 Unspecified cirrhosis of liver: Secondary | ICD-10-CM | POA: Diagnosis not present

## 2023-07-21 DIAGNOSIS — I4821 Permanent atrial fibrillation: Secondary | ICD-10-CM | POA: Diagnosis not present

## 2023-07-21 DIAGNOSIS — N186 End stage renal disease: Secondary | ICD-10-CM | POA: Diagnosis not present

## 2023-07-21 DIAGNOSIS — D5700 Hb-SS disease with crisis, unspecified: Secondary | ICD-10-CM | POA: Diagnosis not present

## 2023-07-21 LAB — TYPE AND SCREEN
ABO/RH(D): AB POS
Antibody Screen: NEGATIVE
Unit division: 0
Unit division: 0

## 2023-07-21 LAB — RENAL FUNCTION PANEL
Albumin: 3.4 g/dL — ABNORMAL LOW (ref 3.5–5.0)
Anion gap: 11 (ref 5–15)
BUN: 35 mg/dL — ABNORMAL HIGH (ref 8–23)
CO2: 27 mmol/L (ref 22–32)
Calcium: 9 mg/dL (ref 8.9–10.3)
Chloride: 95 mmol/L — ABNORMAL LOW (ref 98–111)
Creatinine, Ser: 6.31 mg/dL — ABNORMAL HIGH (ref 0.61–1.24)
GFR, Estimated: 9 mL/min — ABNORMAL LOW (ref >60)
Glucose, Bld: 95 mg/dL (ref 70–99)
Phosphorus: 4.9 mg/dL — ABNORMAL HIGH (ref 2.5–4.6)
Potassium: 4 mmol/L (ref 3.5–5.1)
Sodium: 133 mmol/L — ABNORMAL LOW (ref 135–145)

## 2023-07-21 LAB — CBC
HCT: 20 % — ABNORMAL LOW (ref 39.0–52.0)
Hemoglobin: 7.4 g/dL — ABNORMAL LOW (ref 13.0–17.0)
MCH: 37.9 pg — ABNORMAL HIGH (ref 26.0–34.0)
MCHC: 37 g/dL — ABNORMAL HIGH (ref 30.0–36.0)
MCV: 102.6 fL — ABNORMAL HIGH (ref 80.0–100.0)
Platelets: 113 10*3/uL — ABNORMAL LOW (ref 150–400)
RBC: 1.95 MIL/uL — ABNORMAL LOW (ref 4.22–5.81)
RDW: 23.9 % — ABNORMAL HIGH (ref 11.5–15.5)
WBC: 7.1 10*3/uL (ref 4.0–10.5)
nRBC: 29.6 % — ABNORMAL HIGH (ref 0.0–0.2)

## 2023-07-21 LAB — BPAM RBC
Blood Product Expiration Date: 202502102359
Blood Product Expiration Date: 202502122359
ISSUE DATE / TIME: 202501301515
ISSUE DATE / TIME: 202502010929
Unit Type and Rh: 6200
Unit Type and Rh: 6200

## 2023-07-21 MED ORDER — OXYCODONE HCL ER 15 MG PO T12A
15.0000 mg | EXTENDED_RELEASE_TABLET | Freq: Two times a day (BID) | ORAL | Status: DC
  Administered 2023-07-21 – 2023-07-23 (×5): 15 mg via ORAL
  Filled 2023-07-21 (×5): qty 1

## 2023-07-21 MED ORDER — HYDROMORPHONE HCL 1 MG/ML IJ SOLN
1.0000 mg | INTRAMUSCULAR | Status: DC | PRN
  Administered 2023-07-21 (×2): 2 mg via INTRAVENOUS
  Administered 2023-07-22: 1 mg via INTRAVENOUS
  Administered 2023-07-22 (×3): 2 mg via INTRAVENOUS
  Administered 2023-07-23 (×2): 1 mg via INTRAVENOUS
  Filled 2023-07-21 (×2): qty 1
  Filled 2023-07-21 (×3): qty 2
  Filled 2023-07-21: qty 1
  Filled 2023-07-21 (×2): qty 2

## 2023-07-21 MED ORDER — FOLIC ACID 1 MG PO TABS
1.0000 mg | ORAL_TABLET | Freq: Every day | ORAL | Status: DC
  Administered 2023-07-21 – 2023-07-23 (×3): 1 mg via ORAL
  Filled 2023-07-21 (×3): qty 1

## 2023-07-21 NOTE — Plan of Care (Signed)
  Problem: Education: Goal: Knowledge of General Education information will improve Description: Including pain rating scale, medication(s)/side effects and non-pharmacologic comfort measures Outcome: Progressing   Problem: Clinical Measurements: Goal: Ability to maintain clinical measurements within normal limits will improve Outcome: Progressing Goal: Will remain free from infection Outcome: Progressing Goal: Diagnostic test results will improve Outcome: Progressing   Problem: Activity: Goal: Risk for activity intolerance will decrease Outcome: Progressing   Problem: Nutrition: Goal: Adequate nutrition will be maintained Outcome: Progressing   Problem: Coping: Goal: Level of anxiety will decrease Outcome: Progressing   Problem: Elimination: Goal: Will not experience complications related to bowel motility Outcome: Progressing Goal: Will not experience complications related to urinary retention Outcome: Progressing   Problem: Pain Managment: Goal: General experience of comfort will improve and/or be controlled Outcome: Progressing   Problem: Safety: Goal: Ability to remain free from injury will improve Outcome: Progressing   Problem: Skin Integrity: Goal: Risk for impaired skin integrity will decrease Outcome: Progressing   Problem: Self-Care: Goal: Ability to incorporate actions that prevent/reduce pain crisis will improve Outcome: Progressing   Problem: Bowel/Gastric: Goal: Gut motility will be maintained Outcome: Progressing   Problem: Respiratory: Goal: Pulmonary complications will be avoided or minimized Outcome: Progressing Goal: Acute Chest Syndrome will be identified early to prevent complications Outcome: Progressing

## 2023-07-21 NOTE — Progress Notes (Signed)
Wasting remainder of PCA hydromorphone. 19ml wasted in stericontainer in med room, Horris Latino RN as witness.

## 2023-07-21 NOTE — Progress Notes (Signed)
[facility] Progress Note   Subjective: HGB 7.4 after 1 unit PRBCS. No events reported overnight. Seen in room using PCA pump for pain. He says that his cousin is coming to talk to Palliative Care. Still says that he wants to stop HD. Hold HD orders until further conversations with PC.   Objective Vitals:   07/21/23 0508 07/21/23 0741 07/21/23 0800 07/21/23 0902  BP:  101/64 93/63   Pulse: 94 89 89   Resp: 10 11 10  (!) 7  Temp: 98.2 F (36.8 C) 98 F (36.7 C)    TempSrc: Oral     SpO2: 98% 99% 98% 97%  Weight:      Height:       Physical Exam General: Chronically ill appearing male.  HEENT: facial edema. L facial droop Heart: S1S2 2/6 systolic M Lungs: CTAB No WOB Abdomen: NABS NT Extremities: trace BLE edema Dialysis Access: AVF aneursymal + T/B    Additional Objective Labs: Basic Metabolic Panel: Recent Labs  Lab 07/19/23 0515 07/20/23 0520 07/21/23 0516  NA 132* 135 133*  K 3.9 4.3 4.0  CL 90* 93* 95*  CO2 26 26 27   GLUCOSE 143* 123* 95  BUN 51* 59* 35*  CREATININE 7.67* 9.29* 6.31*  CALCIUM 9.0 9.2 9.0  PHOS  --  7.5* 4.9*   Liver Function Tests: Recent Labs  Lab 07/18/23 1142 07/19/23 0515 07/20/23 0520 07/21/23 0516  AST 54*  50* 48*  --   --   ALT 39  38 38  --   --   ALKPHOS 159*  159* 130*  --   --   BILITOT 6.1*  6.1* 6.3*  --   --   PROT 7.7  7.4 7.1  --   --   ALBUMIN 3.7  3.7 3.6 3.4* 3.4*   Recent Labs  Lab 07/18/23 1142  LIPASE 32   CBC: Recent Labs  Lab 07/18/23 1142 07/18/23 1148 07/18/23 2044 07/19/23 0515 07/19/23 0913 07/20/23 0520 07/20/23 2048 07/21/23 0516  WBC 11.0*  --  9.8 9.1  --  7.6  --  7.1  NEUTROABS 7.9*  --   --   --   --   --   --   --   HGB 5.6*   < > 6.4* 6.4*   < > 6.5* 7.3* 7.4*  HCT 15.8*   < > 17.3* 17.2*   < > 17.4* 20.0* 20.0*  MCV 116.2*  --  106.1* 104.9*  --  106.7*  --  102.6*  PLT 107*  --  107* 124*  --  105*  --  113*   < > = values in this interval not  displayed.   Blood Culture    Component Value Date/Time   SDES EXPECTORATED SPUTUM 07/25/2022 0800   SPECREQUEST NONE 07/25/2022 0800   CULT  07/24/2022 0053    NO GROWTH 5 DAYS Performed at [facility], [address], [place], [place] [address]    REPTSTATUS 07/25/2022 FINAL 07/25/2022 0800    Cardiac Enzymes: No results for input(s): "CKTOTAL", "CKMB", "CKMBINDEX", "TROPONINI" in the last 168 hours. CBG: No results for input(s): "GLUCAP" in the last 168 hours. Iron Studies: No results for input(s): "IRON", "TIBC", "TRANSFERRIN", "FERRITIN" in the last 72 hours. @lablastinr3 @ Studies/Results: No results found. Medications:   sodium chloride   Intravenous Once   sodium chloride   Intravenous Once   Chlorhexidine Gluconate Cloth  6 each Topical Q0600   feeding supplement (NEPRO  CARB STEADY)  237 mL Oral BID BM   gabapentin  100 mg Oral TID   heparin  5,000 Units Subcutaneous Q8H   HYDROmorphone   Intravenous Q4H   hydroxyurea  500 mg Oral Daily   midodrine  10 mg Oral TID WC   multivitamin  1 tablet Oral QHS   pantoprazole  40 mg Oral BID   polyethylene glycol  17 g Oral BID   senna-docusate  2 tablet Oral QHS   sodium chloride flush  3 mL Intravenous Q12H     HD orders: [facility] T,Th,S 4:15 hr 180 NRe 65.3 kg 2.0 K/2.0 Ca 400/500 UFP 2 AVF  - No Heparin - Mircera 225 MCGIV q 2 weeks - Hectorol 9 mcg IV three times per week - Sensipar 30 mg PO TIW    Assessment/Plan: Severe hyperkalemia D/T missed HD-emergent HD 07/18/2023. K+ 4.0 today. Follow labs.  Sickle Cell Crisis-management per primary.  Volume overload is setting of missed HD. CXR with Cardiomegaly, vascular congestion.  Net UF 3.4 liters with HD 07/18/2023 Still above OP EDW. Continue to optimize volume with HD. Afib-rate 110. No anticoagulation D/T GIB. Per primary ESRD -T, Th,S- Next HD 07/22/2022 if he decides to continue HD.  Anemia - Sickle Cell pt. HGB 5.6 on admission. HGB 7.7 S/P 2 units of  PRBCs. Follow HGB, transfuse PRN.  Secondary hyperparathyroidism - Continue OP meds. Corrected Ca+ OK, Check PO4.   HTN/volume - BP Soft. Above OP EDW but no volume excess by exam. Continue midodrine.  Nutrition - Albumin acceptable. Nepro renal vitamins EOL discussion: Mr. Cooksey is alert and oriented X 3. He tells me that he is tired. He tells me that he "doesn't want to do this (dialysis) anymore". He says that every time he turns around it's some thing else he has do, somewhere else he has to go and he doesn't want to do this anymore. He understands that hemodialysis is an optional treatment without which he will die. We will support his wishes and agree to proceed with HD 07/20/2023, then ask Palliative Care to see pt for EOL discussions and most likely, comfort care.   Roanna Reaves H. Tanish Sinkler NP-C 07/21/2023, 10:22 AM  [employer] [phone]

## 2023-07-21 NOTE — Progress Notes (Signed)
PROGRESS NOTE        PATIENT DETAILS Name: Patrick [name] Age: 68 y.o. Sex: male Date of Birth: [name], 1956 Admit Date: 07/18/2023 Admitting Physician [name], MD LKG:[id number], [name], MD  Brief Summary: Patient is a 68 y.o.  male with history of sickle cell disease, ESRD on HD, chronic HFrEF, atrial fibrillation, COPD-who ran out of his usual narcotics approximately 1 week prior to this hospitalization-then developed severe abdominal pain/back pain/leg pain-and missed HD x 2-he subsequently was brought to the ED by his family-where he was found to have volume overload, hyperkalemia was evaluated by-nephrology-underwent emergent HD-and then was subsequently admitted to the hospitalist service.  See below for further details.  Significant events: 1/30>> to ED with missed dialysis x 2-volume overload/shortness of breath-hyperkalemia-Hb 5.6-emergent HD-transfused 1 unit of PRBC.  Significant studies: 12/16>> TTE: EF 40-45%, severe mitral valve regurgitation 1/30>> CXR: No obvious acute abnormalities-possible rounded density in the left mediastinum. 1/30>> RUQ ultrasound: S/p cholecystectomy-no focal abnormality.  Significant microbiology data: 1/30>> COVID/influenza/RSV PCR: Negative  Procedures: None  Consults: Nephrology  Subjective: Appears comfortable-abdominal pain is better-now has mostly back/leg pain.  Does acknowledge that he is contemplating stopping hemodialysis long-term-feels that-his current overall situation is not compatible with quality of life.  Objective: Vitals: Blood pressure 93/63, pulse 89, temperature 98 F (36.7 C), resp. rate (!) 7, height 5\' 11"  (1.803 m), weight 67.2 kg, SpO2 97%.   Exam: Gen Exam:Alert awake-not in any distress HEENT:atraumatic, normocephalic Chest: B/L clear to auscultation anteriorly CVS:S1S2 regular Abdomen:soft non tender, non distended Extremities:no edema Neurology: Non  focal Skin: no rash  Pertinent Labs/Radiology:    Latest Ref Rng & Units 07/21/2023    5:16 AM 07/20/2023    8:48 PM 07/20/2023    5:20 AM  CBC  WBC 4.0 - 10.5 K/uL 7.1   7.6   Hemoglobin 13.0 - 17.0 g/dL 7.4  7.3  6.5   Hematocrit 39.0 - 52.0 % 20.0  20.0  17.4   Platelets 150 - 400 K/uL 113   105     Lab Results  Component Value Date   NA 133 (L) 07/21/2023   K 4.0 07/21/2023   CL 95 (L) 07/21/2023   CO2 27 07/21/2023     Assessment/Plan: Volume overload/acute on chronic HFrEF secondary to missed HD Hyperkalemia secondary to missed HD Volume status/electrolytes all stable after HD on 1/30  Sickle cell crisis Could have been provoked by missed HD/uremia Abdominal pain better-but continues to have some pain in his  back/legs Will attempt to transition him back to long-acting OxyContin-with breakthrough oxycodone/Dilaudid-stop Dilaudid PCA today.   Having almost daily BMs per patient Hb stable but has required 2 units of PRBC so far-last transfused on 2/1.  ESRD on HD TTS Nephrology following and directing care On 2/1-patient conveyed to nephrology PA-that he was contemplating stopping HD-he reconfirms that he is interested in having this conversation with palliative care team/his cousin ([name])who is coming to the hospital today. Continue HD-will allow time for ongoing family discussion.  Multifactorial anemia Secondary to sickle cell disease/anemia of renal failure  S/p 2 units of PRBC-last on 2/1 Defer ESA to the nephrology service. Follow CBC  Chronic hypotension BP stable on midodrine  Chronic atrial fibrillation Rate controlled without the use of any rate control medications Reviewed prior notes-not a candidate for anticoagulation due to  history of GI bleed-AVMs of the stomach/duodenum  History of cirrhosis Relatively well compensated Volume removal with HD  BMI: Estimated body mass index is 20.66 kg/m as calculated from the following:   Height as of this  encounter: 5\' 11"  (1.803 m).   Weight as of this encounter: 67.2 kg.   Code status:   Code Status: Full Code   DVT Prophylaxis: heparin injection 5,000 Units Start: 07/18/23 2200   Family Communication: Cousin-[name]-[phone] left VM 2/2   Disposition Plan: Status is: Inpatient Remains inpatient appropriate because: Severity of illness   Planned Discharge Destination:Home   Diet: Diet Order             Diet renal with fluid restriction Fluid restriction: 1200 mL Fluid; Room service appropriate? Yes; Fluid consistency: Thin  Diet effective now                     Antimicrobial agents: Anti-infectives (From admission, onward)    None        MEDICATIONS: Scheduled Meds:  sodium chloride   Intravenous Once   sodium chloride   Intravenous Once   Chlorhexidine Gluconate Cloth  6 each Topical Q0600   feeding supplement (NEPRO CARB STEADY)  237 mL Oral BID BM   gabapentin  100 mg Oral TID   heparin  5,000 Units Subcutaneous Q8H   HYDROmorphone   Intravenous Q4H   hydroxyurea  500 mg Oral Daily   midodrine  10 mg Oral TID WC   multivitamin  1 tablet Oral QHS   pantoprazole  40 mg Oral BID   polyethylene glycol  17 g Oral BID   senna-docusate  2 tablet Oral QHS   sodium chloride flush  3 mL Intravenous Q12H   Continuous Infusions: PRN Meds:.acetaminophen **OR** acetaminophen, bisacodyl, diphenhydrAMINE, lidocaine (PF), lidocaine-prilocaine, naloxone **AND** sodium chloride flush, ondansetron (ZOFRAN) IV, oxyCODONE, pentafluoroprop-tetrafluoroeth   I have personally reviewed following labs and imaging studies  LABORATORY DATA: CBC: Recent Labs  Lab 07/18/23 1142 07/18/23 1148 07/18/23 2044 07/19/23 0515 07/19/23 0913 07/20/23 0520 07/20/23 2048 07/21/23 0516  WBC 11.0*  --  9.8 9.1  --  7.6  --  7.1  NEUTROABS 7.9*  --   --   --   --   --   --   --   HGB 5.6*   < > 6.4* 6.4* 6.9* 6.5* 7.3* 7.4*  HCT 15.8*   < > 17.3* 17.2* 18.3* 17.4* 20.0*  20.0*  MCV 116.2*  --  106.1* 104.9*  --  106.7*  --  102.6*  PLT 107*  --  107* 124*  --  105*  --  113*   < > = values in this interval not displayed.    Basic Metabolic Panel: Recent Labs  Lab 07/18/23 1142 07/18/23 1148 07/19/23 0515 07/20/23 0520 07/21/23 0516  NA 138 132* 132* 135 133*  K >7.5* 7.3* 3.9 4.3 4.0  CL 91* 102 90* 93* 95*  CO2 17*  --  26 26 27   GLUCOSE 82 76 143* 123* 95  BUN 120* 117* 51* 59* 35*  CREATININE 15.23* 15.60* 7.67* 9.29* 6.31*  CALCIUM 9.4  --  9.0 9.2 9.0  PHOS  --   --   --  7.5* 4.9*    GFR: Estimated Creatinine Clearance: 11.1 mL/min (A) (by C-G formula based on SCr of 6.31 mg/dL (H)).  Liver Function Tests: Recent Labs  Lab 07/18/23 1142 07/19/23 0515 07/20/23 0520 07/21/23 0516  AST 54*  50* 48*  --   --   ALT 39  38 38  --   --   ALKPHOS 159*  159* 130*  --   --   BILITOT 6.1*  6.1* 6.3*  --   --   PROT 7.7  7.4 7.1  --   --   ALBUMIN 3.7  3.7 3.6 3.4* 3.4*   Recent Labs  Lab 07/18/23 1142  LIPASE 32   No results for input(s): "AMMONIA" in the last 168 hours.  Coagulation Profile: Recent Labs  Lab 07/18/23 1420  INR 1.8*    Cardiac Enzymes: No results for input(s): "CKTOTAL", "CKMB", "CKMBINDEX", "TROPONINI" in the last 168 hours.  BNP (last 3 results) No results for input(s): "PROBNP" in the last 8760 hours.  Lipid Profile: No results for input(s): "CHOL", "HDL", "LDLCALC", "TRIG", "CHOLHDL", "LDLDIRECT" in the last 72 hours.  Thyroid Function Tests: No results for input(s): "TSH", "T4TOTAL", "FREET4", "T3FREE", "THYROIDAB" in the last 72 hours.  Anemia Panel: Recent Labs    07/18/23 1142  RETICCTPCT 9.9*    Urine analysis:    Component Value Date/Time   COLORURINE STRAW (A) 06/02/2018 1900   APPEARANCEUR CLEAR 06/02/2018 1900   APPEARANCEUR Clear 05/31/2017 0902   LABSPEC 1.006 06/02/2018 1900   PHURINE 7.0 06/02/2018 1900   GLUCOSEU NEGATIVE 06/02/2018 1900   HGBUR SMALL (A)  06/02/2018 1900   BILIRUBINUR neg 01/26/2020 1124   BILIRUBINUR Negative 05/31/2017 0902   KETONESUR negative 09/08/2018 1621   KETONESUR NEGATIVE 06/02/2018 1900   PROTEINUR Positive (A) 01/26/2020 1124   PROTEINUR 30 (A) 06/02/2018 1900   UROBILINOGEN 1.0 01/26/2020 1124   UROBILINOGEN 0.2 09/14/2013 1354   NITRITE neg 01/26/2020 1124   NITRITE NEGATIVE 06/02/2018 1900   LEUKOCYTESUR Negative 01/26/2020 1124   LEUKOCYTESUR Negative 05/31/2017 0902    Sepsis Labs: Lactic Acid, Venous    Component Value Date/Time   LATICACIDVEN 1.1 10/20/2022 1935    MICROBIOLOGY: Recent Results (from the past 240 hours)  Resp panel by RT-PCR (RSV, Flu A&B, Covid) Anterior Nasal Swab     Status: None   Collection Time: 07/18/23 11:29 AM   Specimen: Anterior Nasal Swab  Result Value Ref Range Status   SARS Coronavirus 2 by RT PCR NEGATIVE NEGATIVE Final   Influenza A by PCR NEGATIVE NEGATIVE Final   Influenza B by PCR NEGATIVE NEGATIVE Final    Comment: (NOTE) The Xpert Xpress SARS-CoV-2/FLU/RSV plus assay is intended as an aid in the diagnosis of influenza from Nasopharyngeal swab specimens and should not be used as a sole basis for treatment. Nasal washings and aspirates are unacceptable for Xpert Xpress SARS-CoV-2/FLU/RSV testing.  Fact Sheet for Patients: BloggerCourse.com  Fact Sheet for Healthcare Providers: SeriousBroker.it  This test is not yet approved or cleared by the [place] FDA and has been authorized for detection and/or diagnosis of SARS-CoV-2 by FDA under an Emergency Use Authorization (EUA). This EUA will remain in effect (meaning this test can be used) for the duration of the COVID-19 declaration under Section 564(b)(1) of the Act, 21 U.S.C. section 360bbb-3(b)(1), unless the authorization is terminated or revoked.     Resp Syncytial Virus by PCR NEGATIVE NEGATIVE Final    Comment: (NOTE) Fact Sheet for  Patients: BloggerCourse.com  Fact Sheet for Healthcare Providers: SeriousBroker.it  This test is not yet approved or cleared by the [place] FDA and has been authorized for detection and/or diagnosis of SARS-CoV-2 by FDA under an Emergency Use Authorization (EUA). This EUA  will remain in effect (meaning this test can be used) for the duration of the COVID-19 declaration under Section 564(b)(1) of the Act, 21 U.S.C. section 360bbb-3(b)(1), unless the authorization is terminated or revoked.  Performed at [facility], [address], [place], [place] [address]     RADIOLOGY STUDIES/RESULTS: No results found.    LOS: 3 days   Jeoffrey Massed, MD  [facility]    To contact the attending provider between 7A-7P or the covering provider during after hours 7P-7A, please log into the web site [email or web address] and access using universal [place] password for that web site. If you do not have the password, please call the hospital operator.  07/21/2023, 10:26 AM

## 2023-07-22 ENCOUNTER — Ambulatory Visit: Payer: PPO | Admitting: Physical Therapy

## 2023-07-22 DIAGNOSIS — K7460 Unspecified cirrhosis of liver: Secondary | ICD-10-CM | POA: Diagnosis not present

## 2023-07-22 DIAGNOSIS — I4821 Permanent atrial fibrillation: Secondary | ICD-10-CM | POA: Diagnosis not present

## 2023-07-22 DIAGNOSIS — I5023 Acute on chronic systolic (congestive) heart failure: Secondary | ICD-10-CM | POA: Diagnosis not present

## 2023-07-22 DIAGNOSIS — Z66 Do not resuscitate: Secondary | ICD-10-CM

## 2023-07-22 DIAGNOSIS — Z515 Encounter for palliative care: Secondary | ICD-10-CM

## 2023-07-22 DIAGNOSIS — N186 End stage renal disease: Secondary | ICD-10-CM | POA: Diagnosis not present

## 2023-07-22 DIAGNOSIS — D5700 Hb-SS disease with crisis, unspecified: Secondary | ICD-10-CM | POA: Diagnosis not present

## 2023-07-22 DIAGNOSIS — G894 Chronic pain syndrome: Secondary | ICD-10-CM | POA: Diagnosis not present

## 2023-07-22 LAB — CBC
HCT: 20.5 % — ABNORMAL LOW (ref 39.0–52.0)
Hemoglobin: 7.5 g/dL — ABNORMAL LOW (ref 13.0–17.0)
MCH: 37.9 pg — ABNORMAL HIGH (ref 26.0–34.0)
MCHC: 36.6 g/dL — ABNORMAL HIGH (ref 30.0–36.0)
MCV: 103.5 fL — ABNORMAL HIGH (ref 80.0–100.0)
Platelets: 107 10*3/uL — ABNORMAL LOW (ref 150–400)
RBC: 1.98 MIL/uL — ABNORMAL LOW (ref 4.22–5.81)
RDW: 22.7 % — ABNORMAL HIGH (ref 11.5–15.5)
WBC: 7.4 10*3/uL (ref 4.0–10.5)
nRBC: 15 % — ABNORMAL HIGH (ref 0.0–0.2)

## 2023-07-22 NOTE — Care Management Important Message (Signed)
Important Message  Patient Details  Name: Patrick [name] MRN: [id number] Date of Birth: 08/09/1955   Important Message Given:  Yes - Medicare IM     [name] Patient left prior to IM delivery will mail to the patient home address., 3:21 PM

## 2023-07-22 NOTE — Progress Notes (Addendum)
PROGRESS NOTE        PATIENT DETAILS Name: Patrick [name] Age: 68 y.o. Sex: male Date of Birth: [birth year] Admit Date: 07/18/2023 Admitting Physician [name], MD WUJ:[id number], [name], MD  Brief Summary: Patient is a 68 y.o.  male with history of sickle cell disease, ESRD on HD, chronic HFrEF, atrial fibrillation, COPD-who ran out of his usual narcotics approximately 1 week prior to this hospitalization-then developed severe abdominal pain/back pain/leg pain-and missed HD x 2-he subsequently was brought to the ED by his family-where he was found to have volume overload, hyperkalemia was evaluated by-nephrology-underwent emergent HD-and then was subsequently admitted to the hospitalist service.  See below for further details.  Significant events: 1/30>> to ED with missed dialysis x 2-volume overload/shortness of breath-hyperkalemia-Hb 5.6-emergent HD-transfused 1 unit of PRBC. 2/1>> patient discussed with nephrology NP-regarding stopping hemodialysis.  Palliative care consulted. 2/2>> transitioned off of Dilaudid PCA  Significant studies: 12/16>> TTE: EF 40-45%, severe mitral valve regurgitation 1/30>> CXR: No obvious acute abnormalities-possible rounded density in the left mediastinum. 1/30>> RUQ ultrasound: S/p cholecystectomy-no focal abnormality.  Significant microbiology data: 1/30>> COVID/influenza/RSV PCR: Negative  Procedures: None  Consults: Nephrology  Subjective: Continues to have some pain in his back-legs-appears comfortable.  Hardly any abdominal pain today.  Claims he talked to his cousin Patrick [name] yesterday-and is leaning towards permanently stopping hemodialysis.  He however has not made a firm decision as of yet.  Objective: Vitals: Blood pressure 102/67, pulse 86, temperature 98.3 F (36.8 C), temperature source Oral, resp. rate 15, height 5\' 11"  (1.803 m), weight 67.2 kg, SpO2 94%.   Exam: Gen Exam:Alert awake-not  in any distress HEENT:atraumatic, normocephalic Chest: B/L clear to auscultation anteriorly CVS:S1S2 regular Abdomen:soft non tender, non distended Extremities:no edema Neurology: Non focal Skin: no rash  Pertinent Labs/Radiology:    Latest Ref Rng & Units 07/22/2023    5:00 AM 07/21/2023    5:16 AM 07/20/2023    8:48 PM  CBC  WBC 4.0 - 10.5 K/uL 7.4  7.1    Hemoglobin 13.0 - 17.0 g/dL 7.5  7.4  7.3   Hematocrit 39.0 - 52.0 % 20.5  20.0  20.0   Platelets 150 - 400 K/uL 107  113      Lab Results  Component Value Date   NA 133 (L) 07/21/2023   K 4.0 07/21/2023   CL 95 (L) 07/21/2023   CO2 27 07/21/2023     Assessment/Plan: Volume overload/acute on chronic HFrEF secondary to missed HD Hyperkalemia secondary to missed HD Volume status/electrolytes all stable after HD on 1/30  Sickle cell crisis Could have been provoked by missed HD/uremia Overall slowly improving-abdominal pain is better-continues to have back/leg pain  Transitioned off Dilaudid PCA yesterday-on a regimen of long-acting OxyContin 15 mg twice daily, and as needed oxycodone/Dilaudid for breakthrough pain On bowel regimen Hb stable after 2 units of PRBC-last transfused on 2/1.  ESRD on HD TTS Nephrology following and directing care On 2/1-patient conveyed to nephrology NP-that he was contemplating stopping HD-he is still talking with his family members-and is leaning towards permanently stopping hemodialysis.  Will allow time for further discussion-awaiting palliative care evaluation.  Multifactorial anemia Secondary to sickle cell disease/anemia of renal failure  S/p 2 units of PRBC-last on 2/1 Defer ESA to the nephrology service. Follow CBC  Chronic hypotension BP stable on midodrine  Chronic atrial fibrillation Rate controlled without the use of any rate control medications Reviewed prior notes-not a candidate for anticoagulation due to history of GI bleed-AVMs of the stomach/duodenum  History of  cirrhosis Relatively well compensated Volume removal with HD  Palliative care As noted above-he is contemplating permanently stopping HD-he claims that given how overall things are for the past several months-he feels that his quality of life is poor and not compatible with his goals of care.  Continue to allow some time for outcomes-continue family discussion-await palliative care evaluation.He however is agreeable for a DNR order.   Addendum Spoke with patients cousin-Patrick [name]-who has had multiple conversations with the patient regarding his desire to stop hemodialysis-she confirms that he is "just tired"-and that his current situation is not compatible with his goals of care.   She apparently talked to him this morning and he did express that he is almost confirmed that he will stop long-term hemodialysis, she will be in the hospital between 9-9:30 AM tomorrow-where we will have further goals of care conversation.  BMI: Estimated body mass index is 20.66 kg/m as calculated from the following:   Height as of this encounter: 5\' 11"  (1.803 m).   Weight as of this encounter: 67.2 kg.   Code status:   Code Status: Full Code   DVT Prophylaxis: heparin injection 5,000 Units Start: 07/18/23 2200   Family Communication: Cousin-Patrick [name]-[phone] on 2/3   Disposition Plan: Status is: Inpatient Remains inpatient appropriate because: Severity of illness   Planned Discharge Destination:Home   Diet: Diet Order             Diet renal with fluid restriction Fluid restriction: 1200 mL Fluid; Room service appropriate? Yes; Fluid consistency: Thin  Diet effective now                     Antimicrobial agents: Anti-infectives (From admission, onward)    None        MEDICATIONS: Scheduled Meds:  sodium chloride   Intravenous Once   sodium chloride   Intravenous Once   Chlorhexidine Gluconate Cloth  6 each Topical Q0600   feeding supplement (NEPRO CARB STEADY)  237 mL Oral  BID BM   folic acid  1 mg Oral Daily   gabapentin  100 mg Oral TID   heparin  5,000 Units Subcutaneous Q8H   hydroxyurea  500 mg Oral Daily   midodrine  10 mg Oral TID WC   multivitamin  1 tablet Oral QHS   oxyCODONE  15 mg Oral Q12H   pantoprazole  40 mg Oral BID   polyethylene glycol  17 g Oral BID   senna-docusate  2 tablet Oral QHS   sodium chloride flush  3 mL Intravenous Q12H   Continuous Infusions: PRN Meds:.acetaminophen **OR** acetaminophen, bisacodyl, diphenhydrAMINE, HYDROmorphone (DILAUDID) injection, lidocaine (PF), lidocaine-prilocaine, naloxone **AND** sodium chloride flush, ondansetron (ZOFRAN) IV, oxyCODONE, pentafluoroprop-tetrafluoroeth   I have personally reviewed following labs and imaging studies  LABORATORY DATA: CBC: Recent Labs  Lab 07/18/23 1142 07/18/23 1148 07/18/23 2044 07/19/23 0515 07/19/23 0913 07/20/23 0520 07/20/23 2048 07/21/23 0516 07/22/23 0500  WBC 11.0*  --  9.8 9.1  --  7.6  --  7.1 7.4  NEUTROABS 7.9*  --   --   --   --   --   --   --   --   HGB 5.6*   < > 6.4* 6.4* 6.9* 6.5* 7.3* 7.4* 7.5*  HCT 15.8*   < > 17.3*  17.2* 18.3* 17.4* 20.0* 20.0* 20.5*  MCV 116.2*  --  106.1* 104.9*  --  106.7*  --  102.6* 103.5*  PLT 107*  --  107* 124*  --  105*  --  113* 107*   < > = values in this interval not displayed.    Basic Metabolic Panel: Recent Labs  Lab 07/18/23 1142 07/18/23 1148 07/19/23 0515 07/20/23 0520 07/21/23 0516  NA 138 132* 132* 135 133*  K >7.5* 7.3* 3.9 4.3 4.0  CL 91* 102 90* 93* 95*  CO2 17*  --  26 26 27   GLUCOSE 82 76 143* 123* 95  BUN 120* 117* 51* 59* 35*  CREATININE 15.23* 15.60* 7.67* 9.29* 6.31*  CALCIUM 9.4  --  9.0 9.2 9.0  PHOS  --   --   --  7.5* 4.9*    GFR: Estimated Creatinine Clearance: 11.1 mL/min (A) (by C-G formula based on SCr of 6.31 mg/dL (H)).  Liver Function Tests: Recent Labs  Lab 07/18/23 1142 07/19/23 0515 07/20/23 0520 07/21/23 0516  AST 54*  50* 48*  --   --   ALT 39   38 38  --   --   ALKPHOS 159*  159* 130*  --   --   BILITOT 6.1*  6.1* 6.3*  --   --   PROT 7.7  7.4 7.1  --   --   ALBUMIN 3.7  3.7 3.6 3.4* 3.4*   Recent Labs  Lab 07/18/23 1142  LIPASE 32   No results for input(s): "AMMONIA" in the last 168 hours.  Coagulation Profile: Recent Labs  Lab 07/18/23 1420  INR 1.8*    Cardiac Enzymes: No results for input(s): "CKTOTAL", "CKMB", "CKMBINDEX", "TROPONINI" in the last 168 hours.  BNP (last 3 results) No results for input(s): "PROBNP" in the last 8760 hours.  Lipid Profile: No results for input(s): "CHOL", "HDL", "LDLCALC", "TRIG", "CHOLHDL", "LDLDIRECT" in the last 72 hours.  Thyroid Function Tests: No results for input(s): "TSH", "T4TOTAL", "FREET4", "T3FREE", "THYROIDAB" in the last 72 hours.  Anemia Panel: No results for input(s): "VITAMINB12", "FOLATE", "FERRITIN", "TIBC", "IRON", "RETICCTPCT" in the last 72 hours.   Urine analysis:    Component Value Date/Time   COLORURINE STRAW (A) 06/02/2018 1900   APPEARANCEUR CLEAR 06/02/2018 1900   APPEARANCEUR Clear 05/31/2017 0902   LABSPEC 1.006 06/02/2018 1900   PHURINE 7.0 06/02/2018 1900   GLUCOSEU NEGATIVE 06/02/2018 1900   HGBUR SMALL (A) 06/02/2018 1900   BILIRUBINUR neg 01/26/2020 1124   BILIRUBINUR Negative 05/31/2017 0902   KETONESUR negative 09/08/2018 1621   KETONESUR NEGATIVE 06/02/2018 1900   PROTEINUR Positive (A) 01/26/2020 1124   PROTEINUR 30 (A) 06/02/2018 1900   UROBILINOGEN 1.0 01/26/2020 1124   UROBILINOGEN 0.2 09/14/2013 1354   NITRITE neg 01/26/2020 1124   NITRITE NEGATIVE 06/02/2018 1900   LEUKOCYTESUR Negative 01/26/2020 1124   LEUKOCYTESUR Negative 05/31/2017 0902    Sepsis Labs: Lactic Acid, Venous    Component Value Date/Time   LATICACIDVEN 1.1 10/20/2022 1935    MICROBIOLOGY: Recent Results (from the past 240 hours)  Resp panel by RT-PCR (RSV, Flu A&B, Covid) Anterior Nasal Swab     Status: None   Collection Time: 07/18/23  11:29 AM   Specimen: Anterior Nasal Swab  Result Value Ref Range Status   SARS Coronavirus 2 by RT PCR NEGATIVE NEGATIVE Final   Influenza A by PCR NEGATIVE NEGATIVE Final   Influenza B by PCR NEGATIVE NEGATIVE Final  Comment: (NOTE) The Xpert Xpress SARS-CoV-2/FLU/RSV plus assay is intended as an aid in the diagnosis of influenza from Nasopharyngeal swab specimens and should not be used as a sole basis for treatment. Nasal washings and aspirates are unacceptable for Xpert Xpress SARS-CoV-2/FLU/RSV testing.  Fact Sheet for Patients: BloggerCourse.com  Fact Sheet for Healthcare Providers: SeriousBroker.it  This test is not yet approved or cleared by the [place] FDA and has been authorized for detection and/or diagnosis of SARS-CoV-2 by FDA under an Emergency Use Authorization (EUA). This EUA will remain in effect (meaning this test can be used) for the duration of the COVID-19 declaration under Section 564(b)(1) of the Act, 21 U.S.C. section 360bbb-3(b)(1), unless the authorization is terminated or revoked.     Resp Syncytial Virus by PCR NEGATIVE NEGATIVE Final    Comment: (NOTE) Fact Sheet for Patients: BloggerCourse.com  Fact Sheet for Healthcare Providers: SeriousBroker.it  This test is not yet approved or cleared by the [place] FDA and has been authorized for detection and/or diagnosis of SARS-CoV-2 by FDA under an Emergency Use Authorization (EUA). This EUA will remain in effect (meaning this test can be used) for the duration of the COVID-19 declaration under Section 564(b)(1) of the Act, 21 U.S.C. section 360bbb-3(b)(1), unless the authorization is terminated or revoked.  Performed at [facility], [address] [place], [place] [address]     RADIOLOGY STUDIES/RESULTS: No results found.    LOS: 4 days   Jeoffrey Massed, MD  [facility]    To contact the attending provider between 7A-7P or the covering provider during after hours 7P-7A, please log into the web site [email or web address] and access using universal [place] password for that web site. If you do not have the password, please call the hospital operator.  07/22/2023, 9:46 AM

## 2023-07-22 NOTE — Plan of Care (Signed)
  Problem: Education: Goal: Knowledge of General Education information will improve Description: Including pain rating scale, medication(s)/side effects and non-pharmacologic comfort measures Outcome: Progressing   Problem: Clinical Measurements: Goal: Ability to maintain clinical measurements within normal limits will improve Outcome: Progressing Goal: Will remain free from infection Outcome: Progressing Goal: Diagnostic test results will improve Outcome: Progressing Goal: Respiratory complications will improve Outcome: Progressing Goal: Cardiovascular complication will be avoided Outcome: Progressing   Problem: Activity: Goal: Risk for activity intolerance will decrease Outcome: Progressing   Problem: Nutrition: Goal: Adequate nutrition will be maintained Outcome: Progressing   Problem: Coping: Goal: Level of anxiety will decrease Outcome: Progressing   Problem: Elimination: Goal: Will not experience complications related to bowel motility Outcome: Progressing   Problem: Pain Managment: Goal: General experience of comfort will improve and/or be controlled Outcome: Progressing   Problem: Safety: Goal: Ability to remain free from injury will improve Outcome: Progressing   Problem: Skin Integrity: Goal: Risk for impaired skin integrity will decrease Outcome: Progressing   Problem: Self-Care: Goal: Ability to incorporate actions that prevent/reduce pain crisis will improve Outcome: Progressing   Problem: Tissue Perfusion: Goal: Complications related to inadequate tissue perfusion will be avoided or minimized Outcome: Progressing   Problem: Respiratory: Goal: Pulmonary complications will be avoided or minimized Outcome: Progressing Goal: Acute Chest Syndrome will be identified early to prevent complications Outcome: Progressing   Problem: Fluid Volume: Goal: Ability to maintain a balanced intake and output will improve Outcome: Progressing   Problem: Health  Behavior: Goal: Postive changes in compliance with treatment and prescription regimens will improve Outcome: Progressing

## 2023-07-22 NOTE — Care Management Important Message (Signed)
Important Message  Patient Details  Name: Patrick [name] MRN: [id number] Date of Birth: 11/07/1955   Important Message Given:  Yes - Medicare IM     [name] 07/22/2023, 3:20 PM

## 2023-07-22 NOTE — Consult Note (Signed)
Consultation Note Date: 07/22/2023   Patient Name: Patrick [name]  DOB: [birth year]  MRN: [id number]  Age / Sex: 68 y.o., male  PCP: Quentin Angst, MD Referring Physician: Maretta Bees, MD  Reason for Consultation: Establishing goals of care  HPI/Patient Profile: 68 y.o. male   admitted on 07/18/2023 with past medical  history of sickle cell disease, ESRD on HD, chronic HFrEF, atrial fibrillation, COPD-who ran out of his usual narcotics approximately 1 week prior to this hospitalization-then developed severe abdominal pain/back pain/leg pain-and missed HD x 2-he subsequently was brought to the ED by his family-where he was found to have volume overload, hyperkalemia was evaluated by-nephrology-underwent emergent HD-and then was subsequently admitted to the hospitalist service.    Patient has had continued overall failure to thrive, within context of full medical support  Patient with the support of his family faces treatment option decisions, advanced directive decisions and anticipatory care needs.  Clinical Assessment and Goals of Care:  This NP Lorinda Creed reviewed medical records, received report from team, assessed the patient and then meet spoke to Mr Gregori to discuss diagnosis, prognosis, GOC, EOL wishes disposition and options.   Concept of Palliative Care was introduced as specialized medical care for people and their families living with serious illness.  If focuses on providing relief from the symptoms and stress of a serious illness.  The goal is to improve quality of life for both the patient and the family.  Values and goals of care important to patient and family were attempted to be elicited.  Created space and opportunity for patient  to explore thoughts and feelings regarding current medical situation. Although he is very weak he verbalizes to me that he does not want to  continue dialysis and wishes to forgo all life prolonging measures and focus on comfort and dignity.  I shared his limited prognosis, he understands.  I was able to speak to patient's H POA/ [name] Sifford by telephone.  I shared our conversation today and she supports decision to focus on comfort.  She tells me they had a conversation over the week-end and he expressed these wishes to her at that time.     Education offered on the difference between a aggressive medical intervention path  and a palliative comfort care path for this patient at this time was had.      Natural trajectory and expectations at EOL were discussed.    Questions and concerns addressed.  Patient/family  encouraged to call with questions or concerns.     PMT will continue to support holistically.           Meeting is planned for tomorrow morning at 0900 at bedside with [name]/HPOA to continue conversation regarding next steps in transition of care.   I shared my concern that patient is weak and appears to be transitioning at EOL.   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          HCPOA/ [name] Sifford    SUMMARY OF RECOMMENDATIONS  Code Status/Advance Care Planning: DNR Comfort and dignity is focus of care No further dialysis Continue current medical support  until family meeting in the morning     Symptom Management:  Per attedning, see MAR   Palliative Prophylaxis:  Bowel Regimen and Frequent Pain Assessment  Additional Recommendations (Limitations, Scope, Preferences): Minimize Medications, No Artificial Feeding, and No Hemodialysis  Psycho-social/Spiritual:  Desire for further Chaplaincy support:no Additional Recommendations: Education on Hospice  Prognosis:  Hours - Days  Discharge Planning: Anticipated Hospital Death      Primary Diagnoses: Present on Admission:  Sickle cell disease (HCC)  Anemia  ESRD (end stage renal disease) (HCC)  Permanent atrial fibrillation (HCC)  COPD (chronic  obstructive pulmonary disease) (HCC)  Chronic pain syndrome  Cirrhosis (HCC)  Volume overload  Uremia   I have reviewed the medical record, interviewed the patient and family, and examined the patient. The following aspects are pertinent.  Past Medical History:  Diagnosis Date   A-fib [facility])    Atrial fibrillation with RVR (HCC) 07/23/2022   Blood dyscrasia    Blood transfusion    "I've had a bunch of them"   CHF (congestive heart failure) (HCC)    followed by hf clinic   Chronic kidney disease    Stage 5, dialysis on Tu/Th/Sa   Dialysis patient (HCC) 04/2019   AV fistula (right arm), TTS   Dysrhythmia    A-Fib per patient   Early satiety    Elevated LFTs 04/01/2022   Elevated troponin 04/01/2022   Empyema of gallbladder 05/27/2021   GERD (gastroesophageal reflux disease)    Gout    Legally blind    Metabolic acidosis with increased anion gap and accumulation of organic acids 04/01/2022   Mitral regurgitation    Muscle tightness-Right arm  05/27/2014   Pneumonia    Pulmonary hypertension (HCC)    Shortness of breath dyspnea    Sickle cell disease, type SS (HCC)    Sickle cell pain crisis (HCC) 11/21/2022   Swelling abdomen    Swelling of extremity, left    Swelling of extremity, right    Tricuspid regurgitation    Social History   Socioeconomic History   Marital status: Divorced    Spouse name: Not on file   Number of children: Not on file   Years of education: Not on file   Highest education level: Not on file  Occupational History   Occupation: disabled  Tobacco Use   Smoking status: Former    Current packs/day: 0.00    Average packs/day: 0.5 packs/day for 41.0 years (20.5 ttl pk-yrs)    Types: Cigarettes    Start date: 12/08/1974    Quit date: 12/08/2015    Years since quitting: 7.6   Smokeless tobacco: Never  Vaping Use   Vaping status: Never Used  Substance and Sexual Activity   Alcohol use: Not Currently    Alcohol/week: 3.0 standard drinks of  alcohol    Types: 1 Glasses of wine, 2 Cans of beer per week    Comment: none since starting dialysis 06/2021   Drug use: No   Sexual activity: Not Currently  Other Topics Concern   Not on file  Social History Narrative   Not on file   Social Drivers of Health   Financial Resource Strain: Low Risk  (09/27/2022)   Overall Financial Resource Strain (CARDIA)    Difficulty of Paying Living Expenses: Not hard at all  Food Insecurity: No Food Insecurity (07/18/2023)   Hunger  Vital Sign    Worried About Programme researcher, broadcasting/film/video in the Last Year: Never true    Ran Out of Food in the Last Year: Never true  Transportation Needs: No Transportation Needs (07/18/2023)   PRAPARE - Administrator, Civil Service (Medical): No    Lack of Transportation (Non-Medical): No  Physical Activity: Inactive (09/27/2022)   Exercise Vital Sign    Days of Exercise per Week: 0 days    Minutes of Exercise per Session: 0 min  Stress: No Stress Concern Present (09/27/2022)   [employer] of Occupational Health - Occupational Stress Questionnaire    Feeling of Stress : Not at all  Social Connections: Socially Isolated (07/18/2023)   Social Connection and Isolation Panel [NHANES]    Frequency of Communication with Friends and Family: More than three times a week    Frequency of Social Gatherings with Friends and Family: More than three times a week    Attends Religious Services: Never    Database administrator or Organizations: No    Attends Engineer, structural: Never    Marital Status: Divorced   Family History  Problem Relation Age of Onset   Alcohol abuse Mother    Liver disease Mother    Cirrhosis Mother    Heart attack Mother    Scheduled Meds:  sodium chloride   Intravenous Once   sodium chloride   Intravenous Once   Chlorhexidine Gluconate Cloth  6 each Topical Q0600   feeding supplement (NEPRO CARB STEADY)  237 mL Oral BID BM   folic acid  1 mg Oral Daily   gabapentin  100 mg  Oral TID   heparin  5,000 Units Subcutaneous Q8H   hydroxyurea  500 mg Oral Daily   midodrine  10 mg Oral TID WC   multivitamin  1 tablet Oral QHS   oxyCODONE  15 mg Oral Q12H   pantoprazole  40 mg Oral BID   polyethylene glycol  17 g Oral BID   senna-docusate  2 tablet Oral QHS   sodium chloride flush  3 mL Intravenous Q12H   Continuous Infusions: PRN Meds:.acetaminophen **OR** acetaminophen, bisacodyl, diphenhydrAMINE, HYDROmorphone (DILAUDID) injection, lidocaine (PF), lidocaine-prilocaine, naloxone **AND** sodium chloride flush, ondansetron (ZOFRAN) IV, oxyCODONE, pentafluoroprop-tetrafluoroeth Medications Prior to Admission:  Prior to Admission medications   Medication Sig Start Date End Date Taking? Authorizing Provider  acetaminophen (TYLENOL) 500 MG tablet Take 1,500 mg by mouth 3 (three) times daily as needed for mild pain, moderate pain, fever or headache.   Yes [provider]  allopurinol (ZYLOPRIM) 100 MG tablet Take 1 tablet (100 mg total) by mouth Every Tuesday,Thursday,and Saturday with dialysis. 06/04/23  Yes Bender, Irving Burton, DO  AURYXIA 1 GM 210 MG(Fe) tablet Take 210-420 mg by mouth See admin instructions. 420 mg with every meal, 210 mg with each snack. 02/11/21  Yes [provider]  diphenhydramine-acetaminophen (TYLENOL PM) 25-500 MG TABS tablet Take 2 tablets by mouth at bedtime as needed (sleep/pain). 09/26/22  Yes Ivonne Andrew, NP  gabapentin (NEURONTIN) 100 MG capsule Take 1 capsule (100 mg total) by mouth 3 (three) times daily. 06/21/23  Yes Ivonne Andrew, NP  hydroxyurea (HYDREA) 500 MG capsule Take 1 capsule (500 mg total) by mouth daily. 05/12/23  Yes Ivonne Andrew, NP  midodrine (PROAMATINE) 10 MG tablet Take 1 tablet (10 mg total) by mouth 3 (three) times daily with meals. 06/04/23  Yes Bender, Irving Burton, DO  ofloxacin (OCUFLOX) 0.3 %  ophthalmic solution Place 1 drop into both eyes 2 (two) times daily. 03/31/23  Yes [provider]   oxyCODONE (OXYCONTIN) 10 mg 12 hr tablet Take 1 tablet (10 mg total) by mouth every 12 (twelve) hours. 05/12/23  Yes Ivonne Andrew, NP  oxyCODONE (ROXICODONE) 5 MG immediate release tablet Take 2 tablets (10 mg total) by mouth every 6 (six) hours as needed for severe pain (pain score 7-10) or breakthrough pain. 06/13/23  Yes Anders Simmonds T, DO  pantoprazole (PROTONIX) 40 MG tablet Take 1 tablet (40 mg total) by mouth daily. Use this prescription after done with twice daily dosing x 2 months 06/04/23  Yes Bender, Irving Burton, DO  prednisoLONE acetate (PRED FORTE) 1 % ophthalmic suspension Place 1 drop into both eyes 2 (two) times daily. 04/03/23  Yes [provider]  timolol (TIMOPTIC) 0.5 % ophthalmic solution Place 1 drop into both eyes 2 (two) times daily. 05/14/23  Yes [provider]  tiZANidine (ZANAFLEX) 4 MG tablet TAKE 1/2 TABLET (2MG  TOTAL) BY MOUTH EVERY 8 HOURS AS NEEDED FOR MUSCLE SPASM 05/12/23  Yes Ivonne Andrew, NP  cinacalcet (SENSIPAR) 30 MG tablet Take 1 tablet (30 mg total) by mouth Every Tuesday,Thursday,and Saturday with dialysis. Patient not taking: Reported on 07/18/2023 06/12/22   Kendell Bane, MD  folic acid (FOLVITE) 1 MG tablet TAKE 1 TABLET BY MOUTH EVERY DAY Patient not taking: Reported on 07/18/2023 04/08/23   Massie Maroon, FNP  lidocaine-prilocaine (EMLA) cream Apply 1 application. topically Every Tuesday,Thursday,and Saturday with dialysis. Patient not taking: Reported on 07/18/2023 09/08/19   [provider]  torsemide (DEMADEX) 20 MG tablet TAKE 2 TABLETS BY MOUTH IN THE MORNING AND AT BEDTIME Patient not taking: Reported on 06/25/2023 01/09/23   Massie Maroon, FNP   No Known Allergies Review of Systems  Unable to perform ROS: Acuity of condition    Physical Exam Constitutional:      Appearance: He is normal weight. He is ill-appearing.  Cardiovascular:     Rate and Rhythm: Normal rate.  Pulmonary:     Effort:  Pulmonary effort is normal.  Skin:    General: Skin is warm and dry.  Neurological:     Mental Status: He is lethargic.     Vital Signs: BP 102/67   Pulse 86   Temp 98.3 F (36.8 C) (Oral)   Resp 15   Ht 5\' 11"  (1.803 m)   Wt 66 kg   SpO2 94%   BMI 20.29 kg/m  Pain Scale: 0-10 POSS *See Group Information*: 1-Acceptable,Awake and alert Pain Score: Asleep   SpO2: SpO2: 94 % O2 Device:SpO2: 94 % O2 Flow Rate: .O2 Flow Rate (L/min): 2 L/min  IO: Intake/output summary:  Intake/Output Summary (Last 24 hours) at 07/22/2023 1153 Last data filed at 07/22/2023 0753 Gross per 24 hour  Intake 840 ml  Output --  Net 840 ml    LBM: Last BM Date : 07/19/23 Baseline Weight: Weight: 67.1 kg Most recent weight: Weight: 66 kg     Palliative Assessment/Data: 30 % at best    Discussed with Dr Jerral Ralph  Time  75 minutes Signed by: Lorinda Creed, NP   Please contact Palliative Medicine Team phone at [phone] for questions and concerns.  For individual provider: See Loretha Stapler

## 2023-07-22 NOTE — Progress Notes (Signed)
Discussed with Hospitalist and Palliative Care NP by secure chat. Patient is now a DNR/DNI with plan to transition to comfort care. Patient will not continue with further hemodialysis. Palliative to meet with family tomorrow to discuss further deliniation of goals of care. Nephrology will sign off at this time. Please reach out to [place] for any questions.  Salome Holmes, NP [facility]

## 2023-07-23 ENCOUNTER — Non-Acute Institutional Stay (HOSPITAL_COMMUNITY)
Admission: RE | Admit: 2023-07-23 | Discharge: 2023-07-23 | Disposition: A | Discharge status: Home / Self Care | Payer: PPO | Source: Ambulatory Visit | Attending: Internal Medicine | Admitting: Internal Medicine

## 2023-07-23 DIAGNOSIS — Z515 Encounter for palliative care: Secondary | ICD-10-CM

## 2023-07-23 DIAGNOSIS — N186 End stage renal disease: Secondary | ICD-10-CM | POA: Diagnosis not present

## 2023-07-23 DIAGNOSIS — D5700 Hb-SS disease with crisis, unspecified: Secondary | ICD-10-CM | POA: Diagnosis not present

## 2023-07-23 DIAGNOSIS — E8770 Fluid overload, unspecified: Secondary | ICD-10-CM | POA: Diagnosis not present

## 2023-07-23 DIAGNOSIS — I5023 Acute on chronic systolic (congestive) heart failure: Secondary | ICD-10-CM | POA: Diagnosis not present

## 2023-07-23 DIAGNOSIS — G894 Chronic pain syndrome: Secondary | ICD-10-CM | POA: Diagnosis not present

## 2023-07-23 MED ORDER — OXYCODONE HCL 15 MG PO TABS: 15.0000 mg | ORAL_TABLET | Freq: Four times a day (QID) | ORAL | Status: DC | PRN

## 2023-07-23 MED ORDER — POLYETHYLENE GLYCOL 3350 17 G PO PACK: 17.0000 g | PACK | Freq: Two times a day (BID) | ORAL | Status: DC

## 2023-07-23 MED ORDER — HYDROMORPHONE HCL 1 MG/ML IJ SOLN
1.0000 mg | INTRAMUSCULAR | Status: DC | PRN
  Administered 2023-07-23 (×4): 2 mg via INTRAVENOUS
  Filled 2023-07-23: qty 2
  Filled 2023-07-23: qty 1
  Filled 2023-07-23 (×2): qty 2
  Filled 2023-07-23: qty 1

## 2023-07-23 MED ORDER — OXYCODONE HCL ER 10 MG PO T12A: 20.0000 mg | EXTENDED_RELEASE_TABLET | Freq: Two times a day (BID) | ORAL | Status: DC

## 2023-07-23 MED ORDER — SENNOSIDES-DOCUSATE SODIUM 8.6-50 MG PO TABS: 2.0000 | ORAL_TABLET | Freq: Every day | ORAL | Status: DC

## 2023-07-23 MED ORDER — OXYCODONE HCL ER 20 MG PO T12A: 20.0000 mg | EXTENDED_RELEASE_TABLET | Freq: Two times a day (BID) | ORAL | Status: DC

## 2023-07-23 MED ORDER — HYDROMORPHONE HCL 1 MG/ML IJ SOLN: 1.0000 mg | INTRAMUSCULAR | Status: DC | PRN

## 2023-07-23 NOTE — Progress Notes (Signed)
 PROGRESS NOTE        PATIENT DETAILS Name: Patrick [name] Age: 68 y.o. Sex: male Date of Birth: [birth year] Admit Date: 07/18/2023 Admitting Physician Marsa KATHEE Scurry, MD ERE:Gzhziz, Precious BRAVO, MD  Brief Summary: Patient is a 68 y.o.  male with history of sickle cell disease, ESRD on HD, chronic HFrEF, atrial fibrillation, COPD-who ran out of his usual narcotics approximately 1 week prior to this hospitalization-then developed severe abdominal pain/back pain/leg pain-and missed HD x 2-he subsequently was brought to the ED by his family-where he was found to have volume overload, hyperkalemia was evaluated by-nephrology-underwent emergent HD-and then was subsequently admitted to the hospitalist service.  See below for further details.  Significant events: 1/30>> to ED with missed dialysis x 2-volume overload/shortness of breath-hyperkalemia-Hb 5.6-emergent HD-transfused 1 unit of PRBC. 2/1>> patient discussed with nephrology NP-regarding stopping hemodialysis.  Palliative care consulted. 2/2>> transitioned off of Dilaudid  PCA 2/3>>comfort care planned-no further HD  Significant studies: 12/16>> TTE: EF 40-45%, severe mitral valve regurgitation 1/30>> CXR: No obvious acute abnormalities-possible rounded density in the left mediastinum. 1/30>> RUQ ultrasound: S/p cholecystectomy-no focal abnormality.  Significant microbiology data: 1/30>> COVID/influenza/RSV PCR: Negative  Procedures: None  Consults: Nephrology  Subjective: Awake-no complaints-lying comfortably in bed.  Cousin at bedside- comfort care-plan is for [facility] on discharge.  Objective: Vitals: Blood pressure 106/75, pulse (!) 105, temperature 97.6 F (36.4 C), temperature source Oral, resp. rate 13, height 5' 11 (1.803 m), weight 69.4 kg, SpO2 97%.   Exam: Awake/alert Soft abdomen Moving all 4 extremities  Pertinent Labs/Radiology:    Latest Ref Rng & Units 07/22/2023     5:00 AM 07/21/2023    5:16 AM 07/20/2023    8:48 PM  CBC  WBC 4.0 - 10.5 K/uL 7.4  7.1    Hemoglobin 13.0 - 17.0 g/dL 7.5  7.4  7.3   Hematocrit 39.0 - 52.0 % 20.5  20.0  20.0   Platelets 150 - 400 K/uL 107  113      Lab Results  Component Value Date   NA 133 (L) 07/21/2023   K 4.0 07/21/2023   CL 95 (L) 07/21/2023   CO2 27 07/21/2023     Assessment/Plan: Volume overload/acute on chronic HFrEF secondary to missed HD Hyperkalemia secondary to missed HD Volume status/electrolytes all stable after HD on 1/30  Sickle cell crisis Could have been provoked by missed HD/uremia Required Dilaudid  PCA initially-has been transitioned to OxyContin  15 mg twice daily and oxycodone  for moderate pain/Dilaudid  for severe pain Hb stable but did require units of PRBC-last transfusion 2/1 Has been transitioned to comfort measures-continue as needed narcotics-no plans to recheck CBC at this point. Continue bowel regimen with MiraLAX /senna  ESRD on HD TTS Nephrology followed closely-during this hospital stay After extensive discussion-he does not desire to continue HD any longer-he has now been transitioned to full comfort measures-palliative care following.  Multifactorial anemia Secondary to sickle cell disease/anemia of renal failure  S/p 2 units of PRBC-last on 2/1  Chronic hypotension BP stable on midodrine   Chronic atrial fibrillation Rate controlled without the use of any rate control medications Reviewed prior notes-not a candidate for anticoagulation due to history of GI bleed-AVMs of the stomach/duodenum  History of cirrhosis Relatively well compensated  Palliative care During this hospitalization-he contemplated stopping HD permanently-as it was not compatible with his goals of  care-did not feel that he was having any quality in his life.  After extensive discussion by this MD-and subsequently by the palliative care team-he has now been transitioned to full comfort measures.  Awaiting  residential hospice bed.  Full DNR  BMI: Estimated body mass index is 21.34 kg/m as calculated from the following:   Height as of this encounter: 5' 11 (1.803 m).   Weight as of this encounter: 69.4 kg.   Code status:   Code Status: Limited: Do not attempt resuscitation (DNR) -DNR-LIMITED -Do Not Intubate/DNI    DVT Prophylaxis:    Family Communication: Cousin-[name] updated at [facility] on 2/4.   Disposition Plan: Status is: Inpatient Remains inpatient appropriate because: Severity of illness   Planned Discharge Destination: Residential hospice   Diet: Diet Order             Diet renal with fluid restriction Fluid restriction: 1200 mL Fluid; Room service appropriate? Yes; Fluid consistency: Thin  Diet effective now                     Antimicrobial agents: Anti-infectives (From admission, onward)    None        MEDICATIONS: Scheduled Meds:  Chlorhexidine  Gluconate Cloth  6 each Topical Q0600   feeding supplement (NEPRO CARB STEADY)  237 mL Oral BID BM   gabapentin   100 mg Oral TID   hydroxyurea   500 mg Oral Daily   midodrine   10 mg Oral TID WC   oxyCODONE   20 mg Oral Q12H   pantoprazole   40 mg Oral BID   polyethylene glycol  17 g Oral BID   senna-docusate  2 tablet Oral QHS   sodium chloride  flush  3 mL Intravenous Q12H   Continuous Infusions: PRN Meds:.acetaminophen  **OR** acetaminophen , bisacodyl , HYDROmorphone  (DILAUDID ) injection, lidocaine -prilocaine , oxyCODONE , pentafluoroprop-tetrafluoroeth   I have personally reviewed following labs and imaging studies  LABORATORY DATA: CBC: Recent Labs  Lab 07/18/23 1142 07/18/23 1148 07/18/23 2044 07/19/23 0515 07/19/23 0913 07/20/23 0520 07/20/23 2048 07/21/23 0516 07/22/23 0500  WBC 11.0*  --  9.8 9.1  --  7.6  --  7.1 7.4  NEUTROABS 7.9*  --   --   --   --   --   --   --   --   HGB 5.6*   < > 6.4* 6.4* 6.9* 6.5* 7.3* 7.4* 7.5*  HCT 15.8*   < > 17.3* 17.2* 18.3* 17.4* 20.0* 20.0*  20.5*  MCV 116.2*  --  106.1* 104.9*  --  106.7*  --  102.6* 103.5*  PLT 107*  --  107* 124*  --  105*  --  113* 107*   < > = values in this interval not displayed.    Basic Metabolic Panel: Recent Labs  Lab 07/18/23 1142 07/18/23 1148 07/19/23 0515 07/20/23 0520 07/21/23 0516  NA 138 132* 132* 135 133*  K >7.5* 7.3* 3.9 4.3 4.0  CL 91* 102 90* 93* 95*  CO2 17*  --  26 26 27   GLUCOSE 82 76 143* 123* 95  BUN 120* 117* 51* 59* 35*  CREATININE 15.23* 15.60* 7.67* 9.29* 6.31*  CALCIUM  9.4  --  9.0 9.2 9.0  PHOS  --   --   --  7.5* 4.9*    GFR: Estimated Creatinine Clearance: 11.5 mL/min (A) (by C-G formula based on SCr of 6.31 mg/dL (H)).  Liver Function Tests: Recent Labs  Lab 07/18/23 1142 07/19/23 0515 07/20/23 0520 07/21/23 0516  AST  54*  50* 48*  --   --   ALT 39  38 38  --   --   ALKPHOS 159*  159* 130*  --   --   BILITOT 6.1*  6.1* 6.3*  --   --   PROT 7.7  7.4 7.1  --   --   ALBUMIN  3.7  3.7 3.6 3.4* 3.4*   Recent Labs  Lab 07/18/23 1142  LIPASE 32   No results for input(s): AMMONIA in the last 168 hours.  Coagulation Profile: Recent Labs  Lab 07/18/23 1420  INR 1.8*    Cardiac Enzymes: No results for input(s): CKTOTAL, CKMB, CKMBINDEX, TROPONINI in the last 168 hours.  BNP (last 3 results) No results for input(s): PROBNP in the last 8760 hours.  Lipid Profile: No results for input(s): CHOL, HDL, LDLCALC, TRIG, CHOLHDL, LDLDIRECT in the last 72 hours.  Thyroid Function Tests: No results for input(s): TSH, T4TOTAL, FREET4, T3FREE, THYROIDAB in the last 72 hours.  Anemia Panel: No results for input(s): VITAMINB12, FOLATE, FERRITIN, TIBC, IRON , RETICCTPCT in the last 72 hours.   Urine analysis:    Component Value Date/Time   COLORURINE STRAW (A) 06/02/2018 1900   APPEARANCEUR CLEAR 06/02/2018 1900   APPEARANCEUR Clear 05/31/2017 0902   LABSPEC 1.006 06/02/2018 1900   PHURINE 7.0  06/02/2018 1900   GLUCOSEU NEGATIVE 06/02/2018 1900   HGBUR SMALL (A) 06/02/2018 1900   BILIRUBINUR neg 01/26/2020 1124   BILIRUBINUR Negative 05/31/2017 0902   KETONESUR negative 09/08/2018 1621   KETONESUR NEGATIVE 06/02/2018 1900   PROTEINUR Positive (A) 01/26/2020 1124   PROTEINUR 30 (A) 06/02/2018 1900   UROBILINOGEN 1.0 01/26/2020 1124   UROBILINOGEN 0.2 09/14/2013 1354   NITRITE neg 01/26/2020 1124   NITRITE NEGATIVE 06/02/2018 1900   LEUKOCYTESUR Negative 01/26/2020 1124   LEUKOCYTESUR Negative 05/31/2017 0902    Sepsis Labs: Lactic Acid, Venous    Component Value Date/Time   LATICACIDVEN 1.1 10/20/2022 1935    MICROBIOLOGY: Recent Results (from the past 240 hours)  Resp panel by RT-PCR (RSV, Flu A&B, Covid) Anterior Nasal Swab     Status: None   Collection Time: 07/18/23 11:29 AM   Specimen: Anterior Nasal Swab  Result Value Ref Range Status   SARS Coronavirus 2 by RT PCR NEGATIVE NEGATIVE Final   Influenza A by PCR NEGATIVE NEGATIVE Final   Influenza B by PCR NEGATIVE NEGATIVE Final    Comment: (NOTE) The Xpert Xpress SARS-CoV-2/FLU/RSV plus assay is intended as an aid in the diagnosis of influenza from Nasopharyngeal swab specimens and should not be used as a sole basis for treatment. Nasal washings and aspirates are unacceptable for Xpert Xpress SARS-CoV-2/FLU/RSV testing.  Fact Sheet for Patients: bloggercourse.com  Fact Sheet for Healthcare Providers: seriousbroker.it  This test is not yet approved or cleared by the [place]  FDA and has been authorized for detection and/or diagnosis of SARS-CoV-2 by FDA under an Emergency Use Authorization (EUA). This EUA will remain in effect (meaning this test can be used) for the duration of the COVID-19 declaration under Section 564(b)(1) of the Act, 21 U.S.C. section 360bbb-3(b)(1), unless the authorization is terminated or revoked.     Resp Syncytial Virus  by PCR NEGATIVE NEGATIVE Final    Comment: (NOTE) Fact Sheet for Patients: bloggercourse.com  Fact Sheet for Healthcare Providers: seriousbroker.it  This test is not yet approved or cleared by the [place]  FDA and has been authorized for detection and/or diagnosis of SARS-CoV-2 by FDA under an  Emergency Use Authorization (EUA). This EUA will remain in effect (meaning this test can be used) for the duration of the COVID-19 declaration under Section 564(b)(1) of the Act, 21 U.S.C. section 360bbb-3(b)(1), unless the authorization is terminated or revoked.  Performed at [facility], [address], [place], [place] [address]     RADIOLOGY STUDIES/RESULTS: No results found.    LOS: 5 days   Donalda Applebaum, MD  [facility]    To contact the attending provider between 7A-7P or the covering provider during after hours 7P-7A, please log into the web site [email or web address] and access using universal [place] password for that web site. If you do not have the password, please call the hospital operator.  07/23/2023, 11:01 AM

## 2023-07-23 NOTE — TOC Initial Note (Signed)
 Transition of Care [facility]) - Initial/Assessment Note    Patient Details  Name: Patrick [name] MRN: [id number] Date of Birth: [birth year]  Transition of Care [facility]) CM/SW Contact:    Inocente GORMAN Kindle, LCSW Phone Number: 07/23/2023, 9:55 AM  Clinical Narrative:                 CSW received consult for Hospice facility placement. CSW spoke with patient's cousin, Montie, who is at bedside with patient. CSW offered choice and she confirmed they would like [facility]. She reported she will be heading back to [place] today but can be available as needed. CSW sent referral to [facility] to review for [facility].   Expected Discharge Plan: Hospice Medical Facility Barriers to Discharge: Hospice Bed not available   Patient Goals and CMS Choice Patient states their goals for this hospitalization and ongoing recovery are:: Comfort CMS Medicare.gov Compare Post Acute Care list provided to:: Patient Represenative (must comment) Choice offered to / list presented to :  [place])      Expected Discharge Plan and Services In-house Referral: Clinical Social Work, Hospice / Palliative Care   Post Acute Care Choice: Residential Hospice Bed Living arrangements for the past 2 months: Single Family Home                                      Prior Living Arrangements/Services Living arrangements for the past 2 months: Single Family Home Lives with:: Self Patient language and need for interpreter reviewed:: Yes Do you feel safe going back to the place where you live?: Yes      Need for Family Participation in Patient Care: Yes (Comment) Care giver support system in place?: Yes (comment)   Criminal Activity/Legal Involvement Pertinent to Current Situation/Hospitalization: No - Comment as needed  Activities of Daily Living   ADL Screening (condition at time of admission) Independently performs ADLs?: Yes (appropriate for developmental age) Is the patient deaf or have  difficulty hearing?: No Does the patient have difficulty seeing, even when wearing glasses/contacts?: Yes Does the patient have difficulty concentrating, remembering, or making decisions?: No  Permission Sought/Granted Permission sought to share information with : Facility Medical Sales Representative, Family Supports Permission granted to share information with : Yes, Verbal Permission Granted  Share Information with NAME: Montie  Permission granted to share info w AGENCY: Hospice  Permission granted to share info w Relationship: Cousin  Permission granted to share info w Contact Information: [phone]  Emotional Assessment Appearance:: Appears stated age Attitude/Demeanor/Rapport: Engaged Affect (typically observed): Accepting Orientation: : Oriented to Self, Oriented to Place, Oriented to  Time, Oriented to Situation Alcohol  / Substance Use: Not Applicable Psych Involvement: No (comment)  Admission diagnosis:  Volume overload [E87.70] Patient Active Problem List   Diagnosis Date Noted   Volume overload 07/18/2023   Acute on chronic systolic CHF (congestive heart failure) (HCC) 07/18/2023   Hyperkalemia 07/18/2023   Bell's palsy 06/25/2023   Pain in right knee 02/06/2023   GI bleed 11/20/2022   Cirrhosis (HCC) 10/21/2022   Hypotension 10/21/2022   Thrombocytopenia (HCC) 10/21/2022   Wound infection 10/02/2022   Legally blind 10/02/2022   Cystic lesion of abdominal viscera 04/06/2022   Bile leak    Uremia 11/30/2021   Cholelithiasis 11/30/2021   Chronic pain syndrome    Abdominal fluid collection 08/22/2021   Gastric AVM    Hiatal hernia    History  of GI bleed 06/27/2021   Visual impairment 06/27/2021   COPD (chronic obstructive pulmonary disease) (HCC) 06/27/2021   Acute gangrenous cholecystitis s/p open cholcystectomy 05/24/2021 05/27/2021   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 05/24/2021 05/27/2021   Protein-calorie malnutrition, moderate (HCC)  05/23/2021   Prolonged QT interval 08/02/2020   Chronic anticoagulation    Tobacco dependence 05/28/2020   Chronic systolic CHF (congestive heart failure) (HCC) 09/11/2019   Encounter for therapeutic drug monitoring 12/29/2015   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   Permanent atrial fibrillation (HCC) 12/24/2015   ESRD (end stage renal disease) (HCC) 05/06/2014   Vitamin D  deficiency 09/25/2013   CKD (chronic kidney disease), stage IV (HCC) 09/25/2013   Onychomycosis of toenail 08/27/2013   Sickle cell disease (HCC) 11/21/2012   Anemia 01/17/2012   Sickle cell crisis (HCC) 01/17/2012   NSVT (nonsustained ventricular tachycardia) (HCC) 10/21/2011   Gout 10/19/2011   PCP:  Jegede, Olugbemiga E, MD Pharmacy:   [id number] [address], [place] - [address] [place] [address] Phone: [phone] Fax: [phone]     Social Drivers of Health (SDOH) Social History: SDOH Screenings   Food Insecurity: No Food Insecurity (07/18/2023)  Housing: Low Risk  (07/18/2023)  Transportation Needs: No Transportation Needs (07/18/2023)  Utilities: Not At Risk (07/18/2023)  Alcohol  Screen: Low Risk  (09/27/2022)  Depression (PHQ2-9): Low Risk  (10/30/2022)  Recent Concern: Depression (PHQ2-9) - High Risk (10/02/2022)  Financial Resource Strain: Low Risk  (09/27/2022)  Physical Activity: Inactive (09/27/2022)  Social Connections: Socially Isolated (07/18/2023)  Stress: No Stress Concern Present (09/27/2022)  Tobacco Use: Medium Risk (07/18/2023)   SDOH Interventions:     Readmission Risk Interventions    07/23/2023    9:53 AM 06/03/2023    4:42 PM 11/27/2022    3:44 PM  Readmission Risk Prevention Plan  Transportation Screening Complete Complete Complete  PCP or Specialist Appt within 3-5 Days  Complete   HRI or Home Care Consult  Complete   Social Work Consult for Recovery Care Planning/Counseling  Complete   Palliative Care Screening  Not Applicable   Medication Review Special Educational Needs Teacher) Complete Referral to Pharmacy Referral to Pharmacy  PCP or Specialist appointment within 3-5 days of discharge Complete  Complete  HRI or Home Care Consult Complete  Complete  SW Recovery Care/Counseling Consult Complete  Complete  Palliative Care Screening Complete  Not Applicable  Skilled Nursing Facility Not Applicable  Not Applicable

## 2023-07-23 NOTE — Care Management Important Message (Signed)
Important Message  Patient Details  Name: Patrick [name] MRN: [id number] Date of Birth: Jan 06, 1956   Important Message Given:  Yes - Medicare IM     [name] 07/23/2023, 3:54 PM

## 2023-07-23 NOTE — Plan of Care (Signed)
 Pt has rested quietly throughout the night with no distress noted. Alert and oriented. On O22LNC. Medicated for pain twice with relief noted. Up to BR to have BM. No other complaints voiced.     Problem: Clinical Measurements: Goal: Cardiovascular complication will be avoided Outcome: Progressing   Problem: Pain Managment: Goal: General experience of comfort will improve and/or be controlled Outcome: Progressing   Problem: Self-Care: Goal: Ability to incorporate actions that prevent/reduce pain crisis will improve Outcome: Progressing   Problem: Bowel/Gastric: Goal: Gut motility will be maintained Outcome: Progressing

## 2023-07-23 NOTE — Consult Note (Signed)
 [facility] Liaison Consult Note   07/23/2023  TURNER BAILLIE Mar 26, 1956 [id number]  Insurance: [employer]  Primary Care Provider:  Jegede, Olugbemiga E, MD, [facility]    Chart reviewed for extreme high risk for unplanned readmission, and as reviewed from [facility]patient [facility] and Palliative consult notes and reveals the patient is currently transitioning to Hospice for Comfort Care.   Plan: Patient will have full care coordination services through Hospice and needs will be met at the hospice level of care. No planned follow up for transitional needs. Will sign off at transition from hospital.  For questions,   .Richerd Fish, RN, BSN, CCM [place]  [facility], [facility] Liaison Direct Dial : [phone] or secure chat Email: [email or web address]

## 2023-07-23 NOTE — Discharge Summary (Signed)
 PATIENT DETAILS Name: Patrick JONETTA Bibles Age: 68 y.o. Sex: male Date of Birth: February 23, 1956 MRN: [id number]. Admitting Physician: Marsa KATHEE Scurry, MD ERE:Gzhziz, Precious BRAVO, MD  Admit Date: 07/18/2023 Discharge date: 07/23/2023  Recommendations for Outpatient Follow-up:  Optimize comfort measures  Admitted From:  Home  Disposition: Hospice care   Discharge Condition: fair  CODE STATUS:   Code Status: Do not attempt resuscitation (DNR) - Comfort care   Diet recommendation:  Diet Order             Diet general           Diet renal with fluid restriction Fluid restriction: 1200 mL Fluid; Room service appropriate? Yes; Fluid consistency: Thin  Diet effective now                    Brief Summary: Patient is a 68 y.o.  male with history of sickle cell disease, ESRD on HD, chronic HFrEF, atrial fibrillation, COPD-who ran out of his usual narcotics approximately 1 week prior to this hospitalization-then developed severe abdominal pain/back pain/leg pain-and missed HD x 2-he subsequently was brought to the ED by his family-where he was found to have volume overload, hyperkalemia was evaluated by-nephrology-underwent emergent HD-and then was subsequently admitted to the hospitalist service.  See below for further details.   Significant events: 1/30>> to ED with missed dialysis x 2-volume overload/shortness of breath-hyperkalemia-Hb 5.6-emergent HD-transfused 1 unit of PRBC. 2/1>> patient discussed with nephrology NP-regarding stopping hemodialysis.  Palliative care consulted. 2/2>> transitioned off of Dilaudid  PCA 2/3>>comfort care planned-no further HD   Significant studies: 12/16>> TTE: EF 40-45%, severe mitral valve regurgitation 1/30>> CXR: No obvious acute abnormalities-possible rounded density in the left mediastinum. 1/30>> RUQ ultrasound: S/p cholecystectomy-no focal abnormality.   Significant microbiology data: 1/30>> COVID/influenza/RSV PCR: Negative    Procedures: None   Consults: Nephrology  Brief Hospital Course: Volume overload/acute on chronic HFrEF secondary to missed HD Hyperkalemia secondary to missed HD Volume status/electrolytes all stable after HD on 1/30   Sickle cell crisis Could have been provoked by missed HD/uremia Required Dilaudid  PCA initially-has been transitioned to OxyContin  15 mg twice daily and oxycodone  for moderate pain/Dilaudid  for severe pain Hb stable but did require 2 units of PRBC-last transfusion 2/1 Has been transitioned to comfort measures-continue as needed narcotics-no plans to recheck CBC at this point. Continue bowel regimen with MiraLAX /senna   ESRD on HD TTS Nephrology followed closely-during this hospital stay After extensive discussion-he does not desire to continue HD any longer-he has now been transitioned to full comfort measures-palliative care followed closely.   Multifactorial anemia Secondary to sickle cell disease/anemia of renal failure  S/p 2 units of PRBC-last on 2/1   Chronic hypotension BP stable on midodrine    Chronic atrial fibrillation Rate controlled without the use of any rate control medications Reviewed prior notes-not a candidate for anticoagulation due to history of GI bleed-AVMs of the stomach/duodenum   History of cirrhosis Relatively well compensated   Palliative care During this hospitalization-he contemplated stopping HD permanently-as it was not compatible with his goals of care-did not feel that he was having any quality in his life.  After extensive discussion by this MD-and subsequently by the palliative care team-he has now been transitioned to full comfort measures.  Awaiting residential hospice bed.  Full DNR   BMI: Estimated body mass index is 21.34 kg/m as calculated from the following:   Height as of this encounter: 5' 11 (1.803 m).   Weight as  of this encounter: 69.4 kg.    Discharge Diagnoses:  Active Problems:   Sickle cell disease  (HCC)   ESRD (end stage renal disease) (HCC)   Cirrhosis (HCC)   Permanent atrial fibrillation (HCC)   COPD (chronic obstructive pulmonary disease) (HCC)   Anemia   Chronic pain syndrome   Uremia   Volume overload   Acute on chronic systolic CHF (congestive heart failure) (HCC)   Hyperkalemia   Discharge Instructions:  Activity:  As tolerated with Full fall precautions use walker/cane & assistance as needed   Discharge Instructions     Diet general   Complete by: As directed    Increase activity slowly   Complete by: As directed    No dressing needed   Complete by: As directed       Allergies as of 07/23/2023   No Known Allergies      Medication List     STOP taking these medications    acetaminophen  500 MG tablet Commonly known as: TYLENOL    allopurinol  100 MG tablet Commonly known as: ZYLOPRIM    Auryxia  1 GM 210 MG(Fe) tablet Generic drug: ferric citrate    cinacalcet  30 MG tablet Commonly known as: SENSIPAR    diphenhydramine -acetaminophen  25-500 MG Tabs tablet Commonly known as: TYLENOL  PM   folic acid  1 MG tablet Commonly known as: FOLVITE    lidocaine -prilocaine  cream Commonly known as: EMLA    ofloxacin 0.3 % ophthalmic solution Commonly known as: OCUFLOX   pantoprazole  40 MG tablet Commonly known as: PROTONIX    prednisoLONE acetate 1 % ophthalmic suspension Commonly known as: PRED FORTE   timolol 0.5 % ophthalmic solution Commonly known as: TIMOPTIC   torsemide  20 MG tablet Commonly known as: DEMADEX        TAKE these medications    gabapentin  100 MG capsule Commonly known as: NEURONTIN  Take 1 capsule (100 mg total) by mouth 3 (three) times daily.   HYDROmorphone  1 MG/ML injection Commonly known as: DILAUDID  Inject 1-2 mLs (1-2 mg total) into the vein every 2 (two) hours as needed for severe pain (pain score 7-10).   hydroxyurea  500 MG capsule Commonly known as: HYDREA  Take 1 capsule (500 mg total) by mouth daily.    midodrine  10 MG tablet Commonly known as: PROAMATINE  Take 1 tablet (10 mg total) by mouth 3 (three) times daily with meals.   oxyCODONE  20 mg 12 hr tablet Commonly known as: OXYCONTIN  Take 1 tablet (20 mg total) by mouth every 12 (twelve) hours. What changed:  medication strength how much to take   oxyCODONE  15 MG immediate release tablet Commonly known as: Roxicodone  Take 1 tablet (15 mg total) by mouth every 6 (six) hours as needed for severe pain (pain score 7-10) or breakthrough pain. What changed:  medication strength how much to take   polyethylene glycol 17 g packet Commonly known as: MIRALAX  / GLYCOLAX  Take 17 g by mouth 2 (two) times daily.   senna-docusate 8.6-50 MG tablet Commonly known as: Senokot-S Take 2 tablets by mouth at bedtime.   tiZANidine  4 MG tablet Commonly known as: ZANAFLEX  TAKE 1/2 TABLET (2MG  TOTAL) BY MOUTH EVERY 8 HOURS AS NEEDED FOR MUSCLE SPASM               Discharge Care Instructions  (From admission, onward)           Start     Ordered   07/23/23 0000  No dressing needed        07/23/23 1157  Follow-up Information     Jegede, Olugbemiga E, MD Follow up.   Specialty: Internal Medicine Why: As needed Contact information: [address] [place] [address] [phone]                No Known Allergies   Other Procedures/Studies: US  Abdomen Limited RUQ (LIVER/GB) Result Date: 07/18/2023 CLINICAL DATA:  Upper quadrant pain EXAM: ULTRASOUND ABDOMEN LIMITED RIGHT UPPER QUADRANT COMPARISON:  None Available. FINDINGS: Gallbladder: Surgically removed Common bile duct: Diameter: 3 mm. Liver: No focal lesion identified. Within normal limits in parenchymal echogenicity. Portal vein is patent on color Doppler imaging with normal direction of blood flow towards the liver. Other: None. IMPRESSION: Status post cholecystectomy.  No other focal abnormality is noted. Electronically Signed   By: Oneil Devonshire  M.D.   On: 07/18/2023 22:03   DG Chest Portable 1 View Result Date: 07/18/2023 CLINICAL DATA:  Abdominal pain, weakness EXAM: PORTABLE CHEST 1 VIEW COMPARISON:  06/13/2023 FINDINGS: Cardiomegaly, aortic atherosclerosis. Rounded density superior to the aortic arch in the left mediastinum. Based on prior CT, I favor this reflects the left innominate vein, but difficult to completely exclude aneurysm. Mild vascular congestion and interstitial prominence which could reflect interstitial edema. No effusions or acute bony abnormality. IMPRESSION: Cardiomegaly, vascular congestion. Question mild interstitial edema. Rounded density in the left mediastinum superior to the aortic arch. Comparing to prior chest CT, I favor this reflects tortuous left innominate vein, but difficult to completely exclude arch or great vessel aneurysm. This could be further evaluated chest CT with IV contrast. Electronically Signed   By: Franky Crease M.D.   On: 07/18/2023 11:47     TODAY-DAY OF DISCHARGE:  Subjective:   Patrick [name] today has no headache,no chest abdominal pain,no new weakness tingling or numbness, feels much better wants to go home today.   Objective:   Blood pressure 98/70, pulse (!) 105, temperature 98.5 F (36.9 C), temperature source Oral, resp. rate 13, height 5' 11 (1.803 m), weight 69.4 kg, SpO2 97%.  Intake/Output Summary (Last 24 hours) at 07/23/2023 1630 Last data filed at 07/23/2023 0600 Gross per 24 hour  Intake 240 ml  Output --  Net 240 ml   Filed Weights   07/22/23 0744 07/22/23 1130 07/23/23 0600  Weight: 70.9 kg 66 kg 69.4 kg    Exam: Awake Alert, Oriented *3, No new F.N deficits, Normal affect [place].AT,PERRAL Supple Neck,No JVD, No cervical lymphadenopathy appriciated.  Symmetrical Chest wall movement, Good air movement bilaterally, CTAB RRR,No Gallops,Rubs or new Murmurs, No Parasternal Heave +ve B.Sounds, Abd Soft, Non tender, No organomegaly appriciated, No rebound  -guarding or rigidity. No Cyanosis, Clubbing or edema, No new Rash or bruise   PERTINENT RADIOLOGIC STUDIES: No results found.   PERTINENT LAB RESULTS: CBC: Recent Labs    07/21/23 0516 07/22/23 0500  WBC 7.1 7.4  HGB 7.4* 7.5*  HCT 20.0* 20.5*  PLT 113* 107*   CMET CMP     Component Value Date/Time   NA 133 (L) 07/21/2023 0516   NA 138 06/21/2023 1124   K 4.0 07/21/2023 0516   CL 95 (L) 07/21/2023 0516   CO2 27 07/21/2023 0516   GLUCOSE 95 07/21/2023 0516   BUN 35 (H) 07/21/2023 0516   BUN 21 06/21/2023 1124   CREATININE 6.31 (H) 07/21/2023 0516   CREATININE 2.90 (H) 08/11/2015 1359   CALCIUM  9.0 07/21/2023 0516   PROT 7.1 07/19/2023 0515   PROT 7.0 06/21/2023 1124   ALBUMIN   3.4 (L) 07/21/2023 0516   ALBUMIN  4.1 06/21/2023 1124   AST 48 (H) 07/19/2023 0515   ALT 38 07/19/2023 0515   ALKPHOS 130 (H) 07/19/2023 0515   BILITOT 6.3 (H) 07/19/2023 0515   BILITOT 2.9 (H) 06/21/2023 1124   GFR 46.63 (L) 01/08/2012 1103   EGFR 11 (L) 06/21/2023 1124   GFRNONAA 9 (L) 07/21/2023 0516   GFRNONAA 23 (L) 08/11/2015 1359    GFR Estimated Creatinine Clearance: 11.5 mL/min (A) (by C-G formula based on SCr of 6.31 mg/dL (H)). No results for input(s): LIPASE, AMYLASE in the last 72 hours. No results for input(s): CKTOTAL, CKMB, CKMBINDEX, TROPONINI in the last 72 hours. Invalid input(s): POCBNP No results for input(s): DDIMER in the last 72 hours. No results for input(s): HGBA1C in the last 72 hours. No results for input(s): CHOL, HDL, LDLCALC, TRIG, CHOLHDL, LDLDIRECT in the last 72 hours. No results for input(s): TSH, T4TOTAL, T3FREE, THYROIDAB in the last 72 hours.  Invalid input(s): FREET3 No results for input(s): VITAMINB12, FOLATE, FERRITIN, TIBC, IRON , RETICCTPCT in the last 72 hours. Coags: No results for input(s): INR in the last 72 hours.  Invalid input(s): PT Microbiology: Recent Results (from the past  240 hours)  Resp panel by RT-PCR (RSV, Flu A&B, Covid) Anterior Nasal Swab     Status: None   Collection Time: 07/18/23 11:29 AM   Specimen: Anterior Nasal Swab  Result Value Ref Range Status   SARS Coronavirus 2 by RT PCR NEGATIVE NEGATIVE Final   Influenza A by PCR NEGATIVE NEGATIVE Final   Influenza B by PCR NEGATIVE NEGATIVE Final    Comment: (NOTE) The Xpert Xpress SARS-CoV-2/FLU/RSV plus assay is intended as an aid in the diagnosis of influenza from Nasopharyngeal swab specimens and should not be used as a sole basis for treatment. Nasal washings and aspirates are unacceptable for Xpert Xpress SARS-CoV-2/FLU/RSV testing.  Fact Sheet for Patients: bloggercourse.com  Fact Sheet for Healthcare Providers: seriousbroker.it  This test is not yet approved or cleared by the [place]  FDA and has been authorized for detection and/or diagnosis of SARS-CoV-2 by FDA under an Emergency Use Authorization (EUA). This EUA will remain in effect (meaning this test can be used) for the duration of the COVID-19 declaration under Section 564(b)(1) of the Act, 21 U.S.C. section 360bbb-3(b)(1), unless the authorization is terminated or revoked.     Resp Syncytial Virus by PCR NEGATIVE NEGATIVE Final    Comment: (NOTE) Fact Sheet for Patients: bloggercourse.com  Fact Sheet for Healthcare Providers: seriousbroker.it  This test is not yet approved or cleared by the [place]  FDA and has been authorized for detection and/or diagnosis of SARS-CoV-2 by FDA under an Emergency Use Authorization (EUA). This EUA will remain in effect (meaning this test can be used) for the duration of the COVID-19 declaration under Section 564(b)(1) of the Act, 21 U.S.C. section 360bbb-3(b)(1), unless the authorization is terminated or revoked.  Performed at [facility], [address],  [place], [place] [address]     FURTHER DISCHARGE INSTRUCTIONS:  Get Medicines reviewed and adjusted: Please take all your medications with you for your next visit with your Primary MD  Laboratory/radiological data: Please request your Primary MD to go over all hospital tests and procedure/radiological results at the follow up, please ask your Primary MD to get all Hospital records sent to his/her office.  In some cases, they will be blood work, cultures and biopsy results pending at the time of your discharge. Please  request that your primary care M.D. goes through all the records of your hospital data and follows up on these results.  Also Note the following: If you experience worsening of your admission symptoms, develop shortness of breath, life threatening emergency, suicidal or homicidal thoughts you must seek medical attention immediately by calling 911 or calling your MD immediately  if symptoms less severe.  You must read complete instructions/literature along with all the possible adverse reactions/side effects for all the Medicines you take and that have been prescribed to you. Take any new Medicines after you have completely understood and accpet all the possible adverse reactions/side effects.   Do not drive when taking Pain medications or sleeping medications (Benzodaizepines)  Do not take more than prescribed Pain, Sleep and Anxiety Medications. It is not advisable to combine anxiety,sleep and pain medications without talking with your primary care practitioner  Special Instructions: If you have smoked or chewed Tobacco  in the last 2 yrs please stop smoking, stop any regular Alcohol   and or any Recreational drug use.  Wear Seat belts while driving.  Please note: You were cared for by a hospitalist during your hospital stay. Once you are discharged, your primary care physician will handle any further medical issues. Please note that NO REFILLS for any discharge medications will  be authorized once you are discharged, as it is imperative that you return to your primary care physician (or establish a relationship with a primary care physician if you do not have one) for your post hospital discharge needs so that they can reassess your need for medications and monitor your lab values.  Total Time spent coordinating discharge including counseling, education and face to face time equals greater than 30 minutes.  Signed: Donalda Applebaum 07/23/2023 4:30 PM

## 2023-07-23 NOTE — TOC Transition Note (Signed)
 Transition of Care [facility]) - Discharge Note   Patient Details  Name: Patrick [name] MRN: [id number] Date of Birth: [birth year]  Transition of Care [facility]) CM/SW Contact:  Harlene Sharps, LCSW Phone Number: 07/23/2023, 4:36 PM   Clinical Narrative:    Pt to be transported to [facility] via [facility]. Nurse to call report to [phone]. Montie notified of transfer.   Final next level of care: Hospice Medical Facility Barriers to Discharge: Barriers Resolved   Patient Goals and CMS Choice Patient states their goals for this hospitalization and ongoing recovery are:: Comfort CMS Medicare.gov Compare Post Acute Care list provided to:: Patient Represenative (must comment) Choice offered to / list presented to :  [place])      Discharge Placement              Patient chooses bed at:  [facility]) Patient to be transferred to facility by: [facility] Name of family member notified: Montie Patient and family notified of of transfer: 07/23/23  Discharge Plan and Services Additional resources added to the After Visit Summary for   In-house Referral: Clinical Social Work, Hospice / Palliative Care   Post Acute Care Choice: Residential Hospice Bed                               Social Drivers of Health (SDOH) Interventions SDOH Screenings   Food Insecurity: No Food Insecurity (07/18/2023)  Housing: Low Risk  (07/18/2023)  Transportation Needs: No Transportation Needs (07/18/2023)  Utilities: Not At Risk (07/18/2023)  Alcohol  Screen: Low Risk  (09/27/2022)  Depression (PHQ2-9): Low Risk  (10/30/2022)  Recent Concern: Depression (PHQ2-9) - High Risk (10/02/2022)  Financial Resource Strain: Low Risk  (09/27/2022)  Physical Activity: Inactive (09/27/2022)  Social Connections: Socially Isolated (07/18/2023)  Stress: No Stress Concern Present (09/27/2022)  Tobacco Use: Medium Risk (07/18/2023)     Readmission Risk Interventions    07/23/2023    9:53 AM 06/03/2023    4:42 PM 11/27/2022     3:44 PM  Readmission Risk Prevention Plan  Transportation Screening Complete Complete Complete  PCP or Specialist Appt within 3-5 Days  Complete   HRI or Home Care Consult  Complete   Social Work Consult for Recovery Care Planning/Counseling  Complete   Palliative Care Screening  Not Applicable   Medication Review Oceanographer) Complete Referral to Pharmacy Referral to Pharmacy  PCP or Specialist appointment within 3-5 days of discharge Complete  Complete  HRI or Home Care Consult Complete  Complete  SW Recovery Care/Counseling Consult Complete  Complete  Palliative Care Screening Complete  Not Applicable  Skilled Nursing Facility Not Applicable  Not Applicable

## 2023-07-23 NOTE — Progress Notes (Signed)
 Patient ID: Patrick [name], male   DOB: [birth year], 68 y.o.   MRN: [id number]    Progress Note from the Palliative Medicine Team at [facility]   Patient Name: Patrick [name]        Date: 07/23/2023 DOB: Apr 21, 1956  Age: 68 y.o. MRN#: [id number] Attending Physician: Raenelle Donalda HERO, MD Primary Care Physician: Jegede, Olugbemiga E, MD Admit Date: 07/18/2023   Reason for Consultation/Follow-up   Establishing Goals of Care   HPI/ Brief Hospital Review   68 y.o. male   admitted on 07/18/2023 with past medical  history of sickle cell disease, ESRD on HD, chronic HFrEF, atrial fibrillation, COPD-who ran out of his usual narcotics approximately 1 week prior to this hospitalization-then developed severe abdominal pain/back pain/leg pain-and missed HD x 2-he subsequently was brought to the ED by his family-where he was found to have volume overload, hyperkalemia was evaluated by-nephrology-underwent emergent HD-and then was subsequently admitted to the hospitalist service.     Patient has had continued overall failure to thrive, within context of full medical support   Patient with the support of his family faces treatment option decisions, advanced directive decisions and anticipatory care needs.    Subjective  Extensive chart review has been completed prior to meeting with patient/family  including labs, vital signs, imaging, progress/consult notes, orders, medications and available advance directive documents.   This NP assessed patient at the bedside as a follow up to  yesterday's GOCs meeting and to meet as scheduled with patient's cousin/Patrick for ongoing conversation regarding plan of care.  Patient again verbalized today his decision for his desire to forego life-prolonging measures, focus on comfort and dignity, allowing for natural death. His cousin/main support person/Patrick [name] supports his decision.  Education offered on hospice benefit; philosophy and  eligibility.  Education offered specifically on inpatient hospice unit.  Education offered on the natural trajectory and expectations at end-of-life  Plan of care -DNR/DNI -No artificial feeding or hydration now or in the future -No further dialysis or other life-prolonging measures -Symptom management  -Minimize medications -Family/patient are hopeful for inpatient hospice for end-of-life care, will place referral.  Patient has high symptom burden and a likely prognosis of less than a few weeks  Education offered today regarding  the importance of continued conversation with family and their  medical providers regarding overall plan of care and treatment options,  ensuring decisions are within the context of the patients values and GOCs.  Questions and concerns addressed   Discussed with primary team and nursing staff   Time: 66   minutes  Detailed review of medical records ( labs, imaging, vital signs), medically appropriate exam ( MS, skin, cardiac,  resp)   discussed with treatment team, counseling and education to patient, family, staff, documenting clinical information, medication management, coordination of care    Ronal Plants NP  Palliative Medicine Team Team Phone # [phone] Pager [phone]

## 2023-07-24 ENCOUNTER — Ambulatory Visit: Payer: HMO | Admitting: Diagnostic Neuroimaging

## 2023-07-25 ENCOUNTER — Telehealth: Payer: Self-pay | Admitting: Internal Medicine

## 2023-07-25 NOTE — Telephone Encounter (Signed)
 Copied from CRM [id number]. Topic: General - Deceased Patient >> 08-02-2023 12:26 PM Yvone Marda Blow wrote: Name of caller: Hadassah with Inpatient Hospice Unit [facility])   Date of death: 01-Aug-2023  Name of funeral home:  [facility]   Phone number of funeral home:  unknown   Provider that needs to sign form: Darice Aho, Nurse Practioner   Timeline for signing:  unknown

## 2023-07-29 ENCOUNTER — Ambulatory Visit: Payer: PPO | Admitting: Physical Therapy

## 2023-08-05 ENCOUNTER — Ambulatory Visit: Payer: PPO | Admitting: Physical Therapy

## 2023-08-11 ENCOUNTER — Other Ambulatory Visit: Payer: Self-pay | Admitting: Nurse Practitioner

## 2023-08-11 DIAGNOSIS — M545 Low back pain, unspecified: Secondary | ICD-10-CM

## 2023-08-12 NOTE — Telephone Encounter (Signed)
 Please advise in Dr Hyman Hopes absence [facility]

## 2023-08-17 NOTE — Progress Notes (Signed)
 Late Note Entry- 08-15-2023  Contacted [facility] this morning to be advised that pt was d/c to [facility] yesterday and will not be resuming care at d/c.   Lauraine Polite Renal Navigator [phone]

## 2023-08-17 DEATH — deceased
